# Patient Record
Sex: Female | Born: 1955 | Race: Black or African American | Hispanic: No | Marital: Single | Smoking: Never smoker
Health system: Southern US, Community
[De-identification: ages and names within clinical notes are randomized; demographics above are authoritative.]

## PROBLEM LIST (undated history)

## (undated) DIAGNOSIS — M79674 Pain in right toe(s): Secondary | ICD-10-CM

## (undated) DIAGNOSIS — R4701 Aphasia: Secondary | ICD-10-CM

## (undated) DIAGNOSIS — I10 Essential (primary) hypertension: Secondary | ICD-10-CM

## (undated) DIAGNOSIS — K5790 Diverticulosis of intestine, part unspecified, without perforation or abscess without bleeding: Secondary | ICD-10-CM

## (undated) DIAGNOSIS — B351 Tinea unguium: Secondary | ICD-10-CM

## (undated) DIAGNOSIS — I63542 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: Secondary | ICD-10-CM

## (undated) DIAGNOSIS — I639 Cerebral infarction, unspecified: Secondary | ICD-10-CM

## (undated) DIAGNOSIS — Z8673 Personal history of transient ischemic attack (TIA), and cerebral infarction without residual deficits: Secondary | ICD-10-CM

## (undated) DIAGNOSIS — N814 Uterovaginal prolapse, unspecified: Secondary | ICD-10-CM

## (undated) DIAGNOSIS — N280 Ischemia and infarction of kidney: Secondary | ICD-10-CM

## (undated) HISTORY — DX: 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: I63.542

## (undated) HISTORY — DX: Tinea unguium: M79.674

## (undated) HISTORY — DX: Ischemia and infarction of kidney: N28.0

## (undated) HISTORY — DX: Tinea unguium: B35.1

## (undated) HISTORY — DX: Personal history of transient ischemic attack (TIA), and cerebral infarction without residual deficits: Z86.73

## (undated) HISTORY — DX: Cerebral infarction, unspecified: I63.9

## (undated) HISTORY — DX: Aphasia: R47.01

---

## 2014-08-17 ENCOUNTER — Emergency Department: Payer: Self-pay | Admitting: Emergency Medicine

## 2014-08-17 LAB — PROTIME-INR
INR: 1
Prothrombin Time: 12.9 secs (ref 11.5–14.7)

## 2014-08-17 LAB — URINALYSIS, COMPLETE
BLOOD: NEGATIVE
Bacteria: NONE SEEN
Bilirubin,UR: NEGATIVE
GLUCOSE, UR: NEGATIVE mg/dL (ref 0–75)
Ketone: NEGATIVE
Leukocyte Esterase: NEGATIVE
NITRITE: NEGATIVE
Ph: 6 (ref 4.5–8.0)
Protein: NEGATIVE
RBC,UR: <1 /HPF (ref 0–5)
Specific Gravity: 1.018 (ref 1.003–1.030)
Squamous Epithelial: <1
WBC UR: 2 /HPF (ref 0–5)

## 2014-08-17 LAB — COMPREHENSIVE METABOLIC PANEL
ALBUMIN: 3.3 g/dL — AB (ref 3.4–5.0)
ALT: 183 U/L — AB
ANION GAP: 6 — AB (ref 7–16)
AST: 122 U/L — AB (ref 15–37)
Alkaline Phosphatase: 107 U/L
BUN: 6 mg/dL — ABNORMAL LOW (ref 7–18)
Bilirubin,Total: 0.4 mg/dL (ref 0.2–1.0)
Calcium, Total: 8.1 mg/dL — ABNORMAL LOW (ref 8.5–10.1)
Chloride: 102 mmol/L (ref 98–107)
Co2: 33 mmol/L — ABNORMAL HIGH (ref 21–32)
Creatinine: 0.57 mg/dL — ABNORMAL LOW (ref 0.60–1.30)
Glucose: 92 mg/dL (ref 65–99)
OSMOLALITY: 279 (ref 275–301)
POTASSIUM: 3.2 mmol/L — AB (ref 3.5–5.1)
Sodium: 141 mmol/L (ref 136–145)
TOTAL PROTEIN: 7.7 g/dL (ref 6.4–8.2)

## 2014-08-17 LAB — CBC
HCT: 30.9 % — ABNORMAL LOW (ref 35.0–47.0)
HGB: 10.5 g/dL — ABNORMAL LOW (ref 12.0–16.0)
MCH: 33.9 pg (ref 26.0–34.0)
MCHC: 34.2 g/dL (ref 32.0–36.0)
MCV: 99 fL (ref 80–100)
PLATELETS: 461 10*3/uL — AB (ref 150–440)
RBC: 3.11 10*6/uL — ABNORMAL LOW (ref 3.80–5.20)
RDW: 16.3 % — AB (ref 11.5–14.5)
WBC: 4.1 10*3/uL (ref 3.6–11.0)

## 2014-08-17 LAB — GAMMA GT: GGT: 374 U/L — AB (ref 5–85)

## 2014-08-17 IMAGING — CT CT ABD-PELV W/ CM
2 of 7 series · 16 of 46 positions shown, 18 images · IV contrast (agent unspecified)
Comparison: None.

CLINICAL DATA: Bloody stools

EXAM:
CT ABDOMEN AND PELVIS WITH CONTRAST
TECHNIQUE: Multidetector CT imaging of the abdomen and pelvis was performed
using the standard protocol following bolus administration of
intravenous contrast.
CONTRAST:  100 mL [JI]

[Series 2: routine abd pel with · axial · 0.60mm/px · z∈[-415,-40]mm · 13 of 87 slices shown, 15 images]
[im 6/87  soft-tissue]
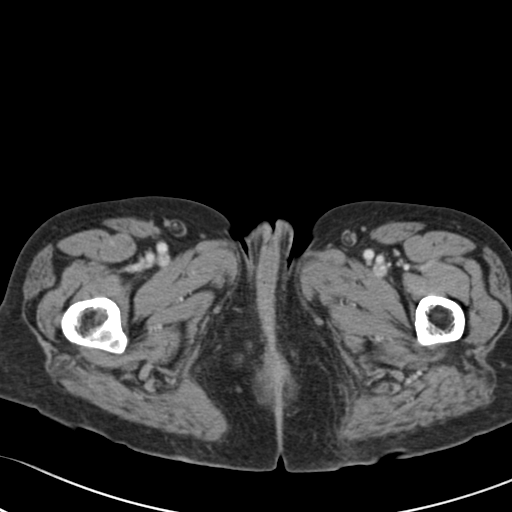
[im 6/87  bone]
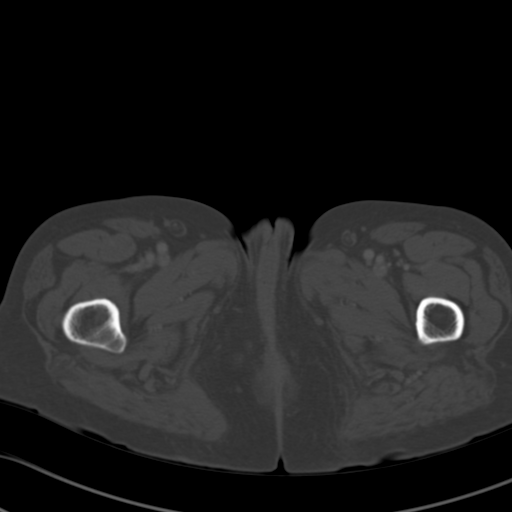
[im 11/87  soft-tissue]
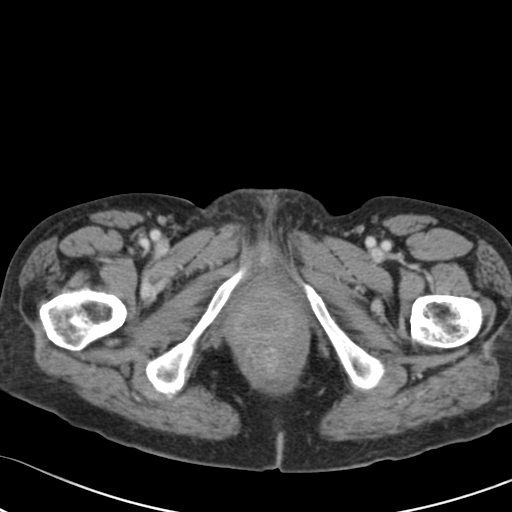
[im 17/87  soft-tissue]
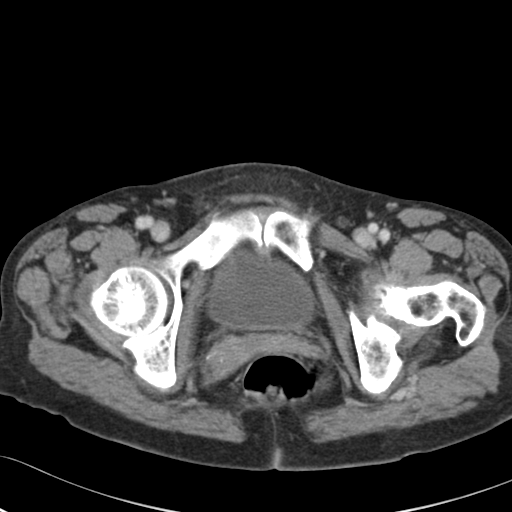
[im 27/87  soft-tissue]
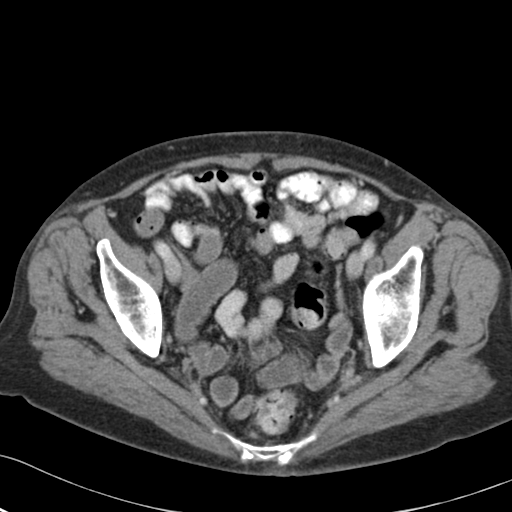
[im 33/87  soft-tissue]
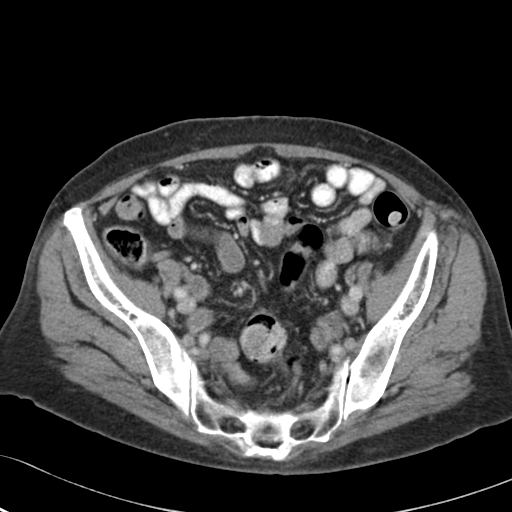
[im 38/87  soft-tissue]
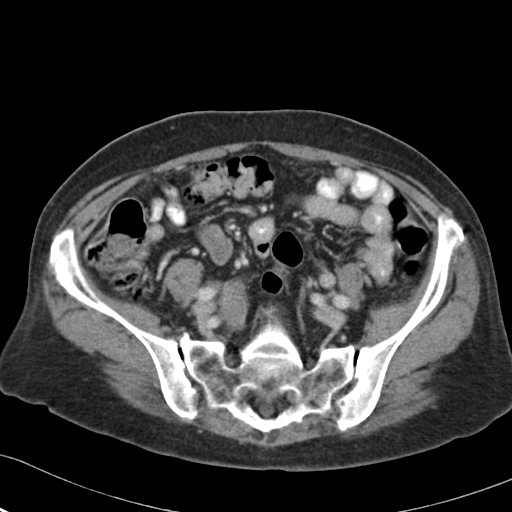
[im 44/87  soft-tissue]
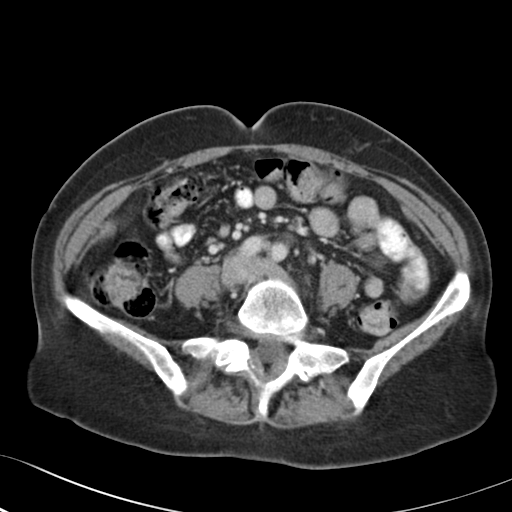
[im 49/87  soft-tissue]
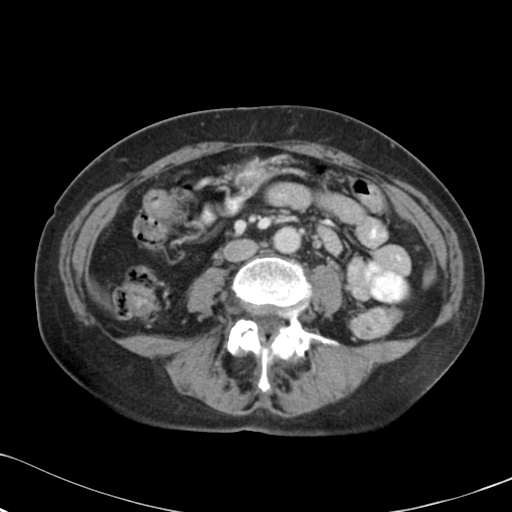
[im 54/87  soft-tissue]
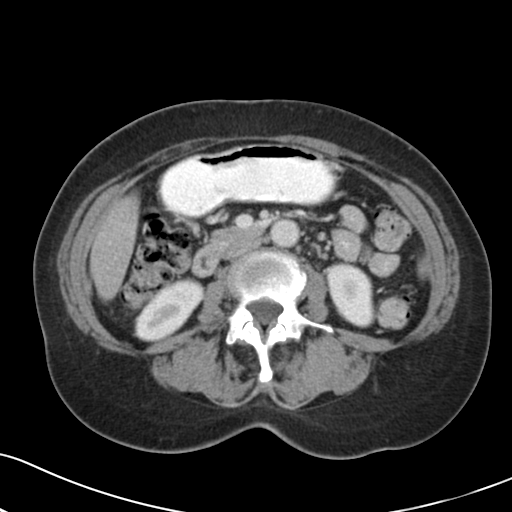
[im 54/87  bone]
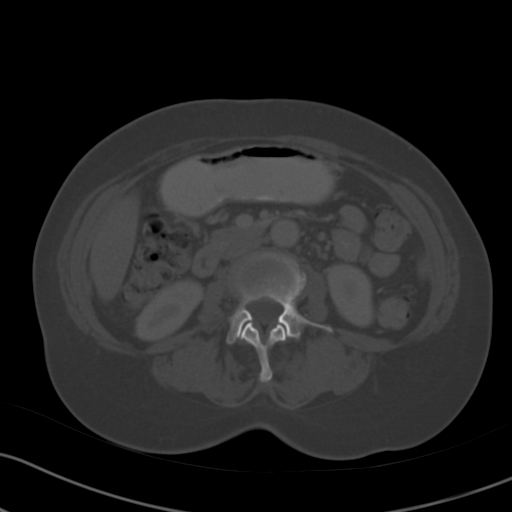
[im 60/87  soft-tissue]
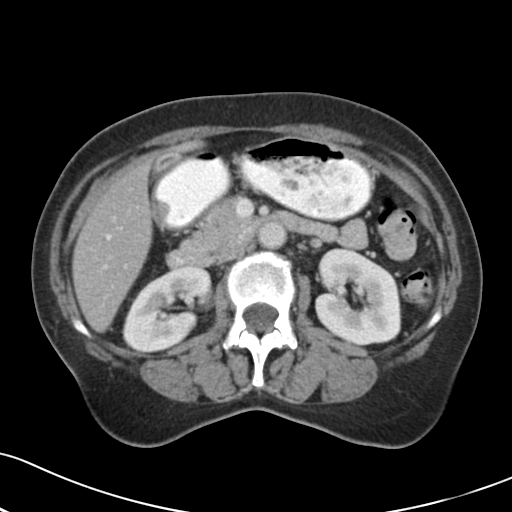
[im 70/87  soft-tissue]
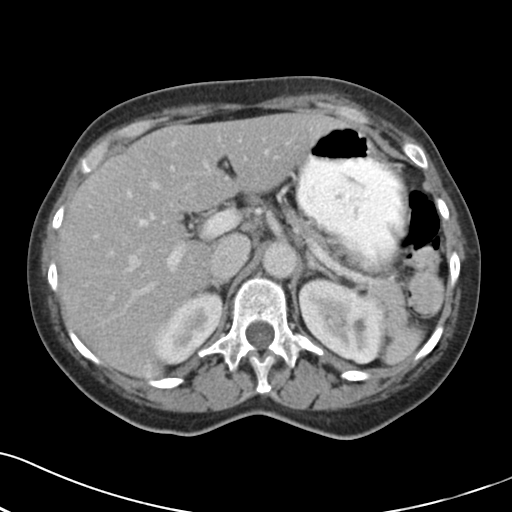
[im 76/87  soft-tissue]
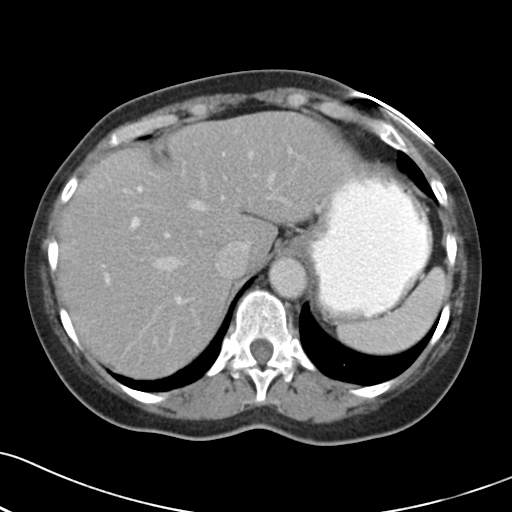
[im 81/87  soft-tissue]
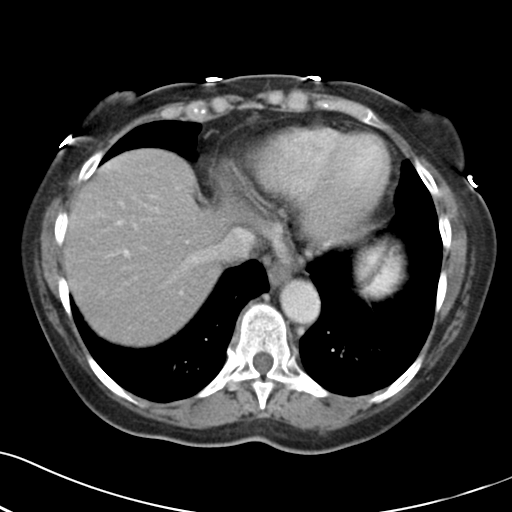

[Series 8: cor routine abd pel with · coronal · 0.60mm/px · 3 of 117 slices shown]
[im 39/117  soft-tissue]
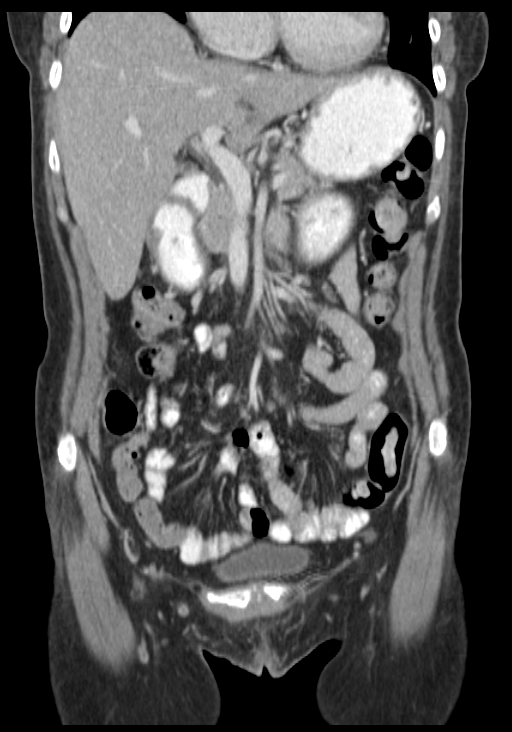
[im 52/117  soft-tissue]
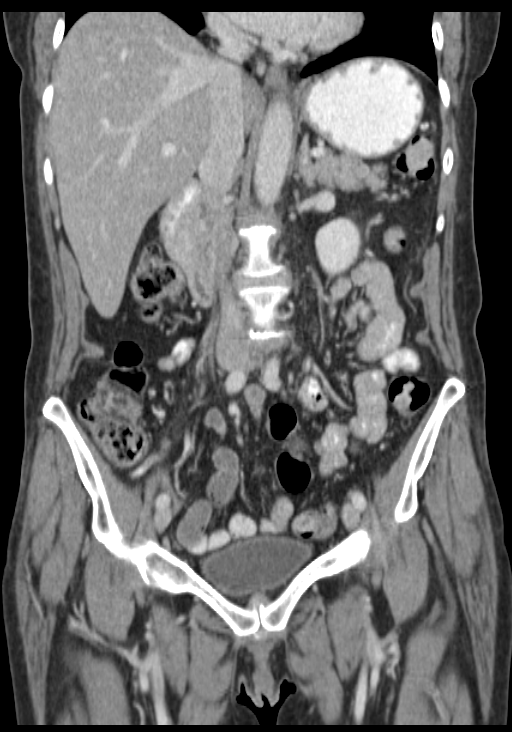
[im 65/117  soft-tissue]
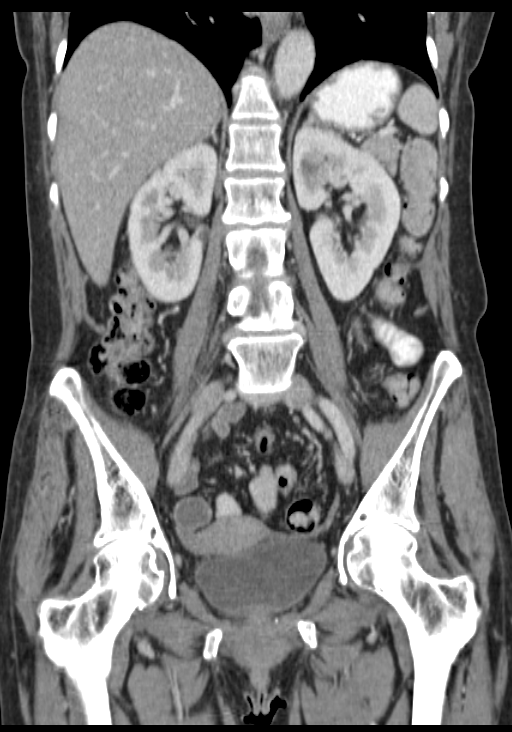

[16 of 46 positions shown; findings below may reference images not displayed]

FINDINGS: The lung bases are free of acute infiltrate or sizable effusion.

The liver is diffusely fatty infiltrated. The gallbladder is
decompressed. The spleen, adrenal glands and pancreas are all normal
in their CT appearance. The kidneys are well visualized bilaterally
and demonstrate evidence of renal cystic change on the left. No
calculi or obstructive changes are noted. The appendix is within
normal limits.

The bladder is well distended. No pelvic mass lesion is seen. No
abnormality to correspond with the patient's given clinical history
is noted. The osseous structures are within normal limits for the
patient's given age. Degenerative change of the lumbar spine is
seen.
IMPRESSION: Chronic changes without acute abnormality.

## 2014-12-17 ENCOUNTER — Emergency Department: Payer: Self-pay | Admitting: Emergency Medicine

## 2015-01-24 NOTE — Consult Note (Signed)
PATIENT NAME:  Lindsey [name], Lindsey [name] MR#:  [id number] DATE OF BIRTH:  06-Feb-1956  DATE OF CONSULTATION:  08/17/2014  REFERRING PHYSICIAN:  Darien Ramus, MD CONSULTING PHYSICIAN:  Kelton Pillar. Sheryle Hail, MD  Clinical question is regarding gastrointestinal bleed.   PRIMARY CARE PHYSICIAN: Not local.   HISTORY OF PRESENT ILLNESS: This is a 59 year old African-American female who presents to the Emergency Department complaining of blood on her toilet paper. This is the second trip the patient has made to the Emergency Department today. The first visit was for a significantly larger amount of blood that she states sank to the bottom of the toilet bowl and barely turned the water in the commode pink. She states that her bleeding was non-painful. She denies lightheadedness, palpitations, shortness of breath or chest pain. The patient also denies abdominal pain. The events of today are the first time this has happened to this degree. She admits that she has had some bleeding hemorrhoids in the past but did not bleed this much. When asked to specifically quantify how much bleeding she had, she states it would be likely less than a bathroom cup of blood. I was initially called to admit the patient to the hospital, but discussed the procedures and ramifications of inpatient hospitalization, which prompted the patient to ask if some of these procedures could be completed as an outpatient.  REVIEW OF SYSTEMS:  CONSTITUTIONAL: The patient denies fever or weakness.  EYES: Denies blurred vision or inflammation.  EARS, NOSE AND THROAT: Denies tinnitus or sore throat.  RESPIRATORY: Denies shortness of breath or cough. CARDIOVASCULAR: Denies chest pain or palpitations.  GASTROINTESTINAL: Denies nausea, vomiting, diarrhea or abdominal pain. She denies hematochezia or hematemesis. The bleeding that she had today was bright red to a slightly maroon color.  GENITOURINARY: Denies dysuria, increased frequency, or hesitancy of  urination.  ENDOCRINE: Denies polyuria or polydipsia. HEMATOLOGIC AND LYMPHATIC: Denies easy bruising or bleeding.  INTEGUMENTARY: Denies rashes or lesions.  MUSCULOSKELETAL: Denies myalgias or arthralgias.  NEUROLOGIC: Denies numbness in her extremities or dysarthria. The patient does admit to headaches.   PSYCHIATRIC: Denies depression or suicidal ideation. The patient admits to anxiety and stress due to recently moving from [place], [place].   PAST MEDICAL HISTORY: Hypertension,   PAST SURGICAL HISTORY: None.  SOCIAL HISTORY: The patient has a history of alcohol abuse. She has stopped drinking 3 weeks ago. She used to drink wine, beer, and liquor in varying amounts, usually on a daily basis. She denies any drug use. She used to smoke while drinking but now occasionally has only 1 to 2 cigarettes per day.   FAMILY HISTORY: Significant for hypertension. There is no heart disease or cancer that she is aware of that runs throughout the family.   MEDICATIONS:  1. Diphenhydramine 25 mg 1 capsule p.o. at bedtime as needed for itching or insomnia.  2. Cetirizine 10 mg 1 capsule p.o. daily.  3. Hydroxyzine 25 mg 1 capsule p.o. 3 times a day as needed for itching.  4. Atenolol 25 mg 1 tab p.o. daily.  5. Amlodipine 10 mg 1 tablet p.o. daily.  6. Clobetasol topical ointment 0.05% applied topically to the affected area at bedtime.  7. Rea Lo 40% topical cream, apply a thick amount of cream to itchy spots every morning. Notably, the patient does not take all of her antihistamines within the same day.   ALLERGIES: No known drug allergies.   PERTINENT LABORATORY RESULTS AND RADIOGRAPHIC FINDINGS: Serum glucose is 92. BUN is 6.  Creatinine 0.57. Sodium is 141. Potassium is 3.2. Chloride is 102. Bicarbonate is 33. Calcium is 8.1. Serum albumin is 3.3. Alkaline phosphatase is 107. AST is 122. ALT is 183. GGT is 374. White blood cell count is 4.1. Hemoglobin is 10.5. Hematocrit 30.9. Platelet count is  461,000, INR is 1. Urinalysis is negative for infection.   PHYSICAL EXAMINATION:  VITAL SIGNS: Temperature is 99.8, pulse is 85, respirations 18, blood pressure 133/75, pulse oximetry 100% on room air.  GENERAL: The patient is alert and oriented x3 in no apparent distress.   HEENT: Normocephalic, atraumatic. Pupils equal, round, and reactive to light and accommodation. Extraocular movements are intact. Mucous membranes are moist.  NECK: Trachea is midline. No adenopathy.  CHEST: Symmetric, atraumatic.  CARDIOVASCULAR: Regular rate and rhythm. Normal S1, S2. No rubs, clicks, or murmurs appreciated.  LUNGS: Clear to auscultation bilaterally. Normal effort and excursion.  ABDOMEN: Positive bowel sounds. Soft, nontender, nondistended. No hepatosplenomegaly.  GENITOURINARY: Deferred.  MUSCULOSKELETAL: The patient moves all 4 extremities equally. There is 5/5 strength in upper and lower extremities bilaterally.  SKIN: No rashes, but the patient does have some nodular lesions that appeared to be worsened by constant picking and scratching.  EXTREMITIES: No clubbing, cyanosis, or edema.  NEUROLOGIC: Cranial nerves II through XII are grossly intact.  PSYCHIATRIC: Mood is normal. Affect is congruent.   ASSESSMENT AND PLAN: This is a 59 year old female with some bleeding per rectum.  1. Gastrointestinal bleed. The patient describes the blood as bright red and non-painful. It was a small volume of blood and the patient was asymptomatic from a constitutional standpoint. It is unlikely that she has a significant lower gastrointestinal bleed that would lead to hemodynamic ability. She likely has some internal hemorrhoids at this point that may be bleeding. I have discussed this with the patient and she would prefer not to spend the night in the hospital. She expresses if she is able to obtain a screening endoscopy as an outpatient, she would prefer that as it would be more convenient to her schedule as well as  more financially feasible. I do not believe that the patient's gastrointestinal bleed is unstable, and she will be safe for outpatient evaluation.  2. Hypokalemia. The patient received some oral potassium repletion in the Emergency Department.  3. Transaminitis. The patient's hepatic enzyme ratio is not specifically consistent with alcoholic hepatitis. However, she admits to drinking in the past. The elevation in GGT suggests that she has some intrahepatic biliary inflammation, which could come from viral hepatitis or some cholestasis. Notably, the patient does not have any scleral icterus nor any jaundice to her soft palate or nail beds. Her total bilirubin is within normal limits. Outpatient evaluation by the gastroenterologist should follow up these findings.  4. Anemia. The patient does not have a dangerously low hemoglobin or hematocrit. This is likely secondary to a very slow gastrointestinal bleed or simply chronic iron deficiency, although her MCV is high-normal (likely secondary to chronic B12 deficiency due to alcohol abuse). We have encouraged the patient to establish care with a primary care doctor who can address this issue, as well as her gastroenterologist.  5. Disposition: The patient has been given the number for the Emergency Department social worker who can help with her case management. We have also told her that we will try to arrange an outpatient appointment with gastroenterology if establishing care with a primary care doctor is too difficult for right now. We have obtained the patient's contact information  and will be sure to follow up to be sure she has her gastrointestinal bleed evaluated in a timely fashion  .  ____________________________ Kelton Pillar. Sheryle Hail, MD msd:jh D: 08/18/2014 04:58:53 ET T: 08/18/2014 16:10:96 ET JOB#: [id number]  cc: Kelton Pillar. Sheryle Hail, MD, <Dictator> Kelton Pillar DIAMOND MD ELECTRONICALLY SIGNED 08/21/2014 1:25

## 2015-10-28 ENCOUNTER — Ambulatory Visit
Admission: RE | Admit: 2015-10-28 | Discharge: 2015-10-28 | Disposition: A | Discharge status: Home / Self Care | Payer: Self-pay | Source: Ambulatory Visit | Attending: Oncology | Admitting: Oncology

## 2015-10-28 ENCOUNTER — Other Ambulatory Visit: Payer: Self-pay | Admitting: Oncology

## 2015-10-28 ENCOUNTER — Ambulatory Visit: Payer: Self-pay | Attending: Oncology | Admitting: *Deleted

## 2015-10-28 ENCOUNTER — Encounter: Payer: Self-pay | Admitting: *Deleted

## 2015-10-28 VITALS — BP 117/82 | HR 81 | Temp 98.7°F | Resp 16 | Ht 64.96 in | Wt 175.6 lb

## 2015-10-28 DIAGNOSIS — Z0000 Encounter for general adult medical examination without abnormal findings: Secondary | ICD-10-CM

## 2015-10-28 IMAGING — MG MM DIGITAL SCREENING BILAT W/ CAD
6 series · 6 of 6 positions shown · non-contrast
Comparison: Previous exam(s).

CLINICAL DATA: Screening.

EXAM:
DIGITAL SCREENING BILATERAL MAMMOGRAM WITH CAD

[L MLO]
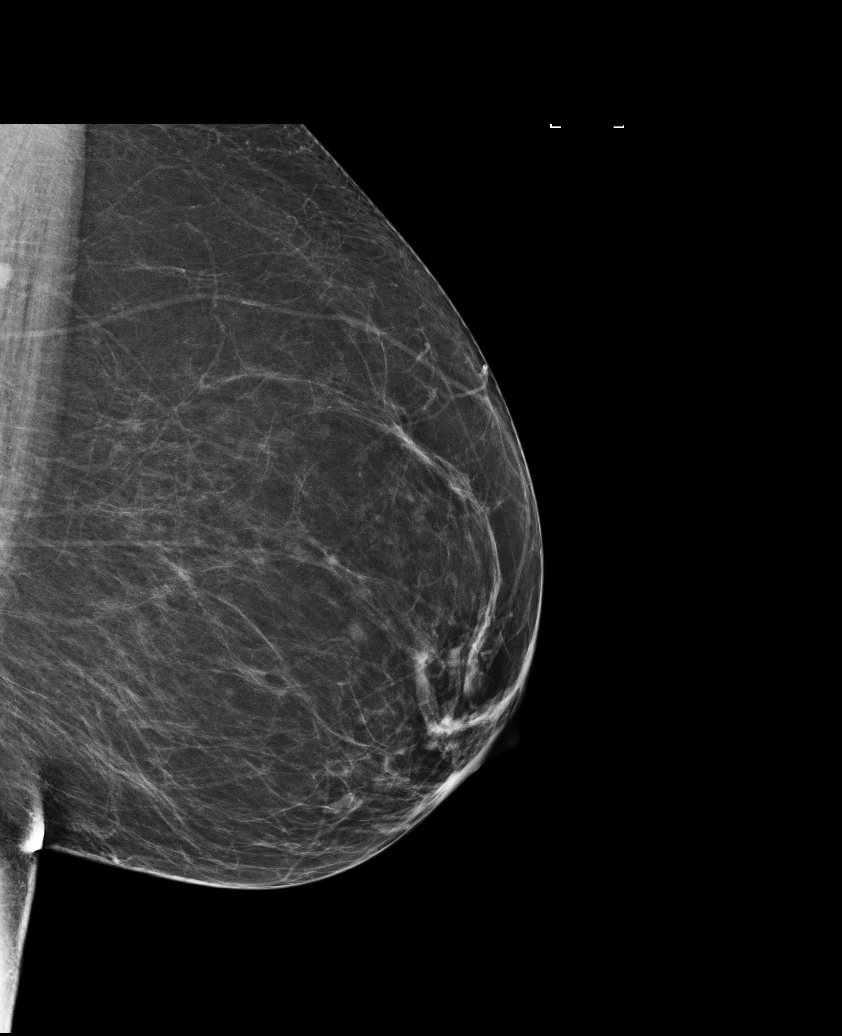

[R CC (1 of 2)]
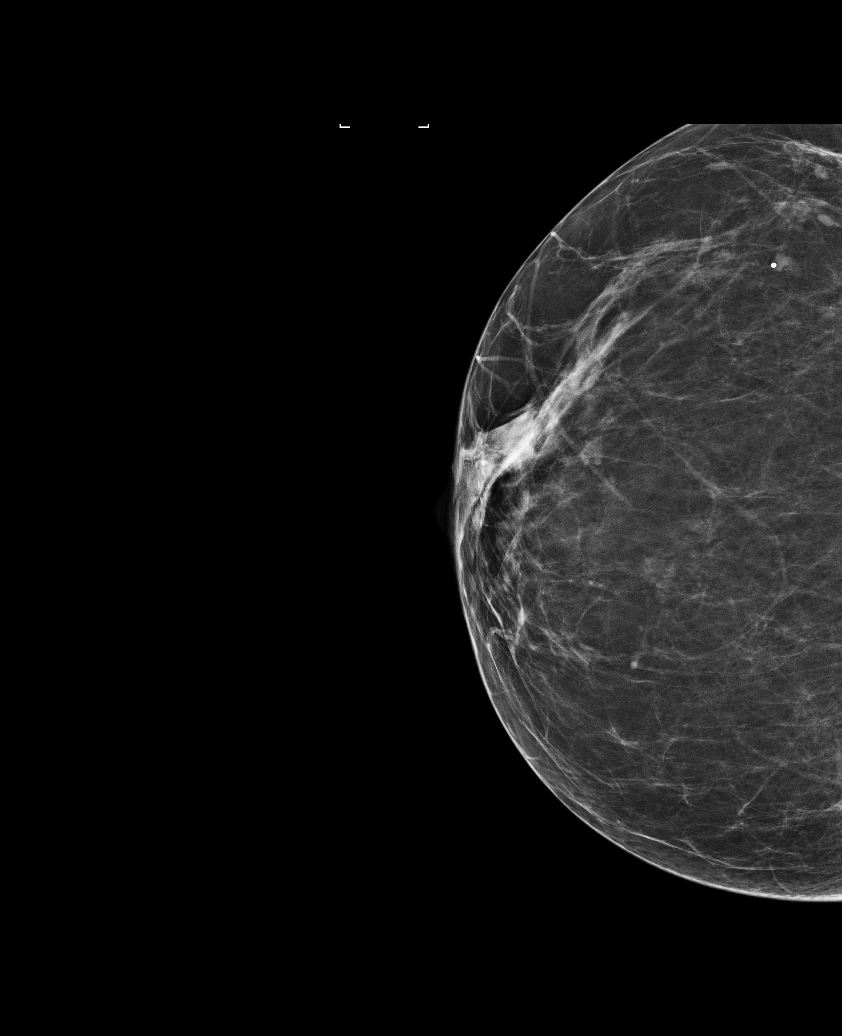

[R MLO (1 of 2)]
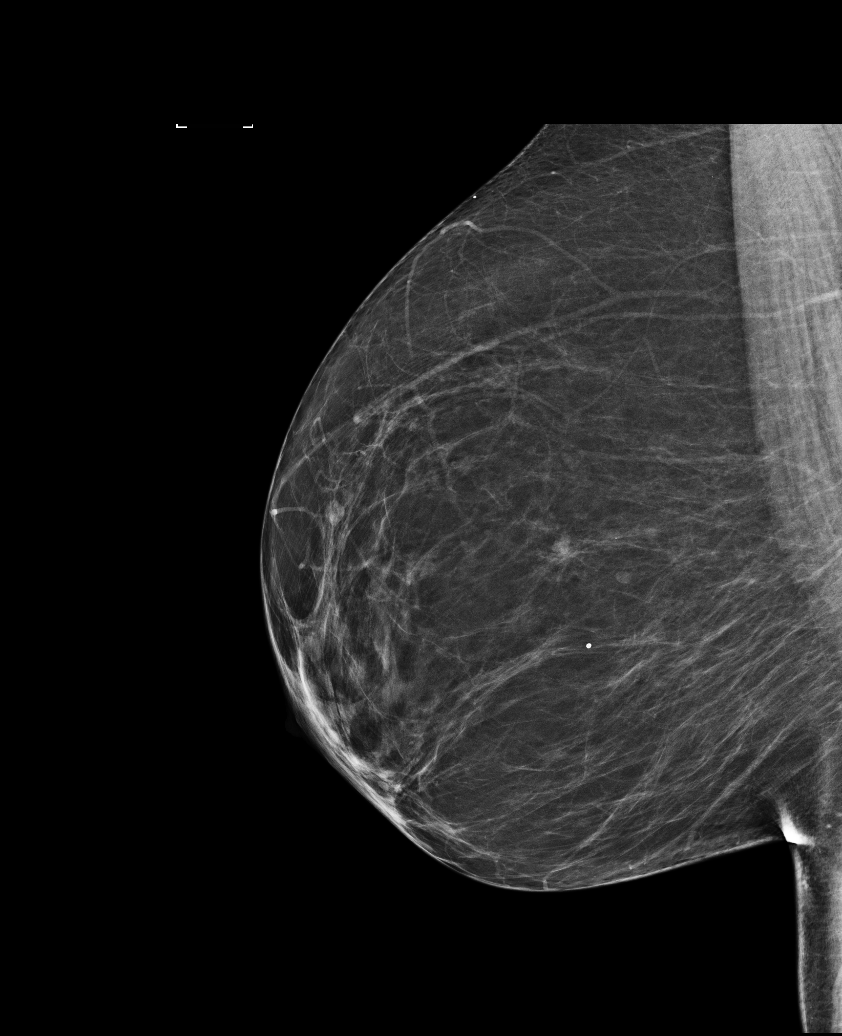

[L CC]
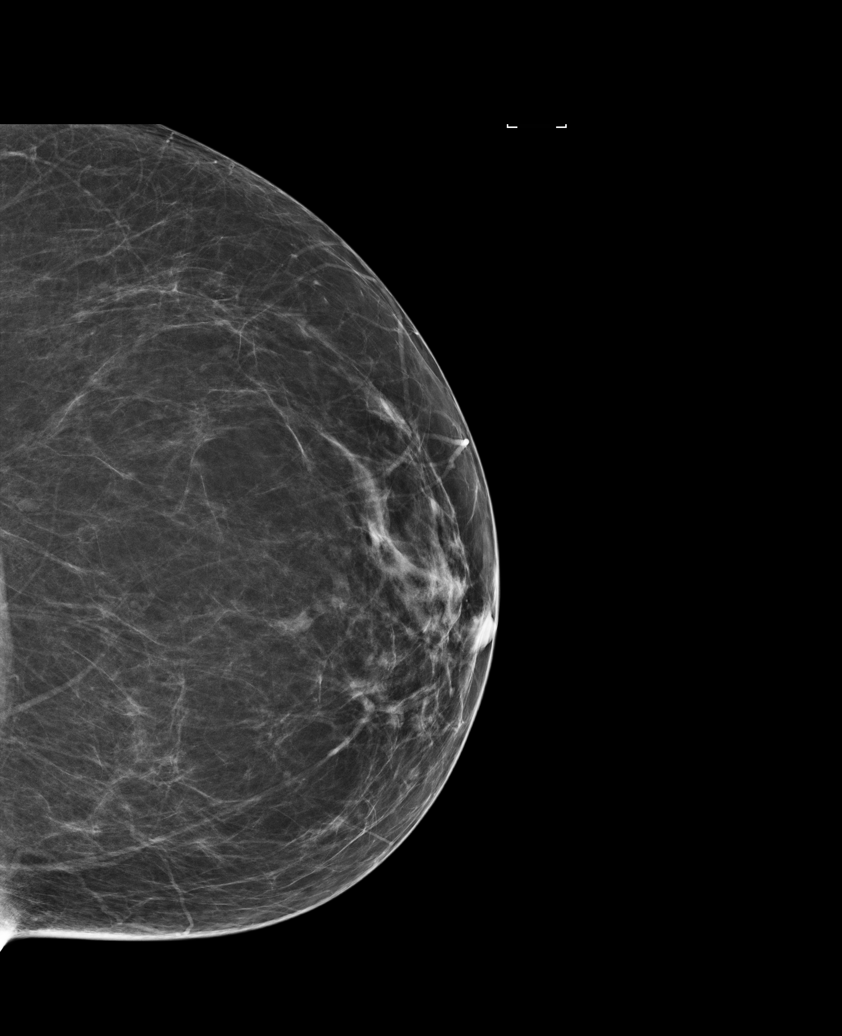

[R CC (2 of 2)]
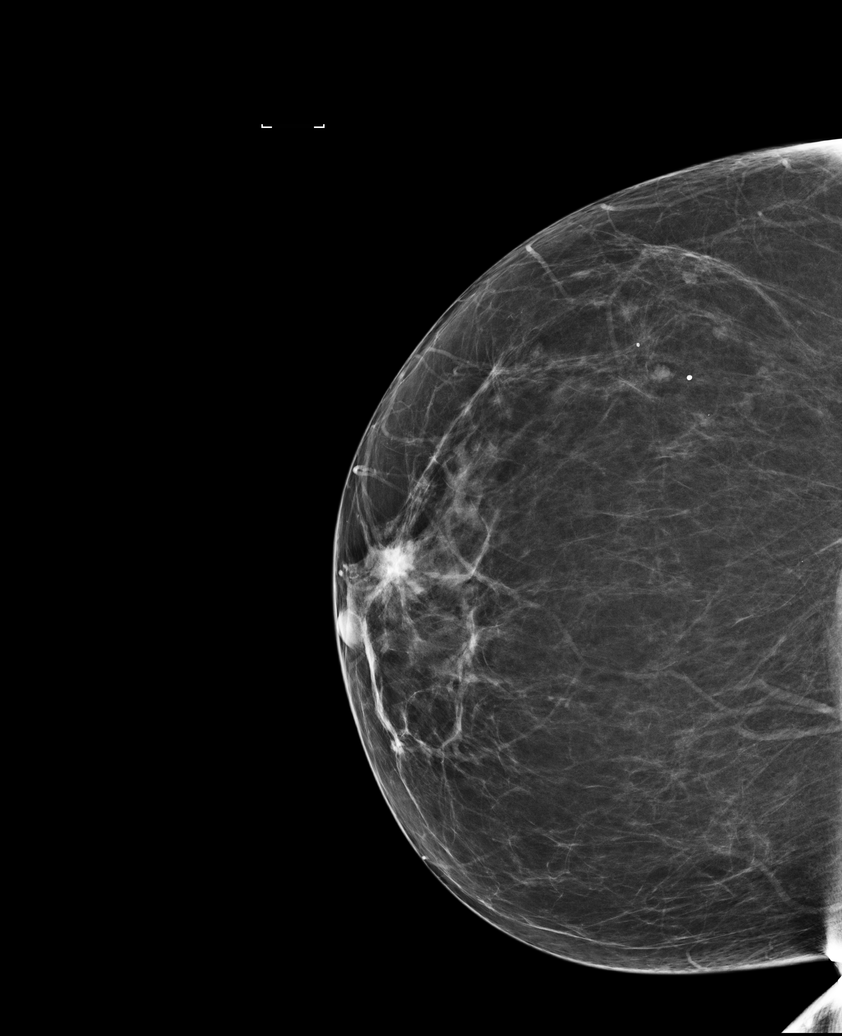

[R MLO (2 of 2)]
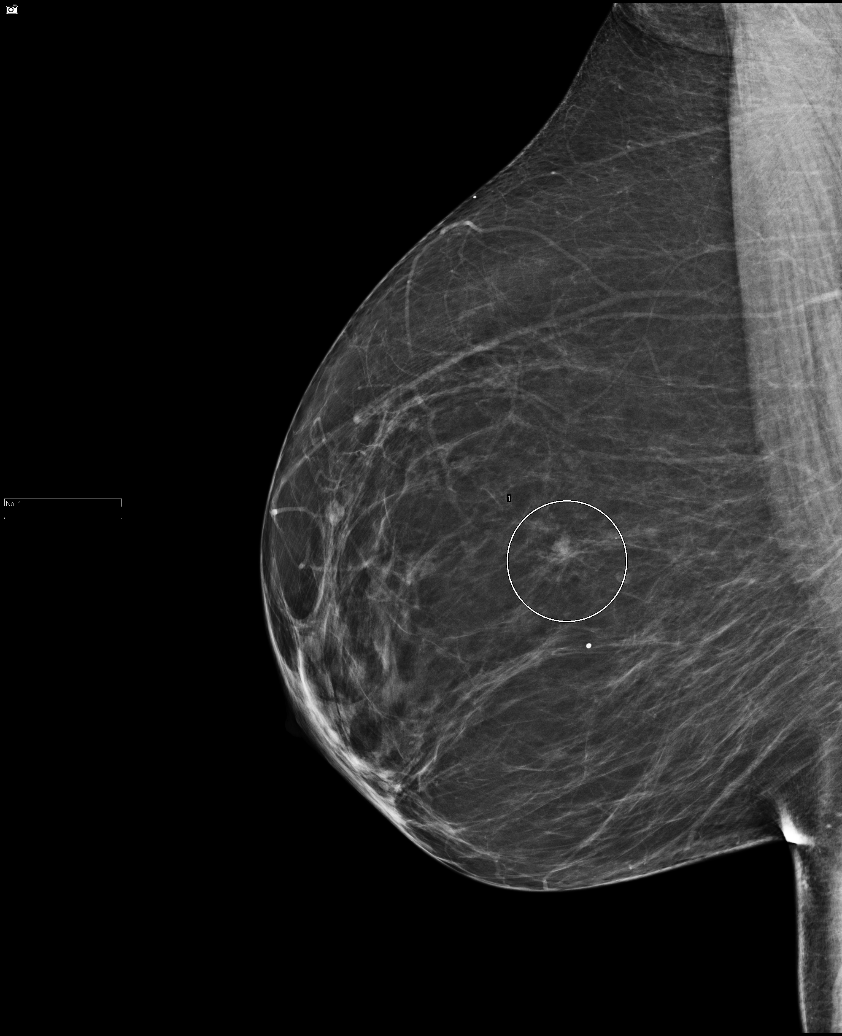

[6 of 6 positions shown; findings below may reference images not displayed]

ACR Breast Density Category b: There are scattered areas of
fibroglandular density.
FINDINGS: In the right breast, 2 asymmetries warrant further evaluation. In
the left breast, no findings suspicious for malignancy. Images were
processed with CAD.
IMPRESSION: Further evaluation is suggested for asymmetries in the right breast.

RECOMMENDATION:
Diagnostic mammogram and possibly ultrasound of the right breast.
(Code:[UC])

The patient will be contacted regarding the findings, and additional
imaging will be scheduled.

BI-RADS CATEGORY  0: Incomplete. Need additional imaging evaluation
and/or prior mammograms for comparison.

## 2015-10-28 NOTE — Patient Instructions (Signed)
Human Papillomavirus Human papillomavirus (HPV) is the most common sexually transmitted infection (STI) and is highly contagious. HPV infections cause genital warts and cancers to the outlet of the womb (cervix), birth canal (vagina), opening of the birth canal (vulva), and anus. There are over 100 types of HPV. Unless wartlike lesions are present in the throat or there are genital warts that you can see or feel, HPV usually does not cause symptoms. It is possible to be infected for long periods and pass it on to others without knowing it. CAUSES  HPV is spread from person to person through sexual contact. This includes oral, vaginal, or anal sex. RISK FACTORS  Having unprotected sex. HPV can be spread by oral, vaginal, or anal sex.  Having several sex partners.  Having a sex partner who has other sex partners.  Having or having had another sexually transmitted infection. SIGNS AND SYMPTOMS  Most people carrying HPV do not have any symptoms. If symptoms are present, symptoms may include:  Wartlike lesions in the throat (from having oral sex).  Warts in the infected skin or mucous membranes.  Genital warts that may itch, burn, or bleed.  Genital warts that may be painful or bleed during sexual intercourse. DIAGNOSIS  If wartlike lesions are present in the throat or genital warts are present, your health care provider can usually diagnose HPV by physical examination.   Genital warts are easily seen with the naked eye.  Currently, there is no FDA-approved test to detect HPV in males.  In females, a Pap test can show cells that are infected with HPV.  In females, a scope can be used to view the cervix (colposcopy). A colposcopy can be performed if the pelvic exam or Pap test is abnormal. A sample of tissue may be removed (biopsy) during the colposcopy. TREATMENT  There is no treatment for the virus itself. However, there are treatments for the health problems and symptoms HPV can cause.  Your health care provider will follow you closely after you are treated. This is because the HPV can come back and may need treatment again. Treatment of HPV may include:   Medicines, which may be injected or applied in a cream, lotion, or gel form.  Use of a probe to apply extreme cold (cryotherapy).  Application of an intense beam of light (laser treatment).  Use of a probe to apply extreme heat (electrocautery).  Surgery. HOME CARE INSTRUCTIONS   Take medicines only as directed by your health care provider.  Use over-the-counter creams for itching or irritation as directed by your health care provider.  Keep all follow-up visits as directed by your health care provider. This is important.  Do not touch or scratch the warts.  Do not treat genital warts with medicines used for treating hand warts.  Do not have sex while you are being treated.  Do not douche or use tampons during treatment of HPV.  Tell your sex partner about your infection because he or she may also need treatment.  If you become pregnant, tell your health care provider that you have had HPV. Your health care provider will monitor you closely during pregnancy to be sure your baby is safe.  After treatment, use condoms during sex to prevent future infections.  Have only one sex partner.  Have a sex partner who does not have other sex partners. PREVENTION   Talk to your health care provider about getting the HPV vaccines. These vaccines prevent some HPV infections and cancers.   It is recommended that the vaccine be given to males and females between the ages of 9 and 26 years old. It will not work if you already have HPV, and it is not recommended for pregnant women.  A Pap test is done to screen for cervical cancer in women.  The first Pap test should be done at age 21 years.  Between ages 21 and 29 years, Pap tests are repeated every 2 years.  Beginning at age 30, you are advised to have a Pap test every  3 years as long as your past 3 Pap tests have been normal.  Some women have medical problems that increase the chance of getting cervical cancer. Talk to your health care provider about these problems. It is especially important to talk to your health care provider if a new problem develops soon after your last Pap test. In these cases, your health care provider may recommend more frequent screening and Pap tests.  The above recommendations are the same for women who have or have not gotten the vaccine for HPV.  If you had a hysterectomy for a problem that was not a cancer or a condition that could lead to cancer, then you no longer need Pap tests. However, even if you no longer need a Pap test, a regular exam is a good idea to make sure no other problems are starting.   If you are between the ages of 65 and 70 years and you have had normal Pap tests going back 10 years, you no longer need Pap tests. However, even if you no longer need a Pap test, a regular exam is a good idea to make sure no other problems are starting.  If you have had past treatment for cervical cancer or a condition that could lead to cancer, you need Pap tests and screening for cancer for at least 20 years after your treatment.  If Pap tests have been discontinued, risk factors (such as a new sexual partner)need to be reassessed to determine if screening should be resumed.  Some women may need screenings more often if they are at high risk for cervical cancer. SEEK MEDICAL CARE IF:   The treated skin becomes red, swollen, or painful.  You have a fever.  You feel generally ill.  You feel lumps or pimple-like projections in and around your genital area.  You develop bleeding of the vagina or the treatment area.  You have painful sexual intercourse. MAKE SURE YOU:   Understand these instructions.  Will watch your condition.  Will get help if you are not doing well or get worse.   This information is not  intended to replace advice given to you by your health care provider. Make sure you discuss any questions you have with your health care provider.   Document Released: 12/10/2003 Document Revised: 10/10/2014 Document Reviewed: 12/25/2013 Elsevier Interactive Patient Education 2016 [employer]   Gave patient hand-out, Women Staying Healthy, Active and Well from [facility], with education on breast health, pap smears, heart and colon health. 

## 2015-10-28 NOTE — Progress Notes (Signed)
Subjective:     Patient ID: Lindsey [name], female   DOB: [birth year], 60 y.o.   MRN: [id number]  HPI   Review of Systems     Objective:   Physical Exam  Pulmonary/Chest: Right breast exhibits no inverted nipple, no mass, no nipple discharge, no skin change and no tenderness. Left breast exhibits no inverted nipple, no mass, no nipple discharge, no skin change and no tenderness. Breasts are symmetrical.  Abdominal: There is no splenomegaly or hepatomegaly.  Genitourinary: Rectal exam shows external hemorrhoid. Rectal exam shows no mass. No breast swelling, tenderness, discharge or bleeding. No labial fusion. There is no rash, tenderness, lesion or injury on the right labia. There is no rash, tenderness, lesion or injury on the left labia. Uterus is not deviated, not enlarged, not fixed and not tender. Cervix exhibits no motion tenderness, no discharge and no friability. Right adnexum displays no mass, no tenderness and no fullness. Left adnexum displays no mass, no tenderness and no fullness. No erythema, tenderness or bleeding in the vagina. No foreign body around the vagina. No signs of injury around the vagina. No vaginal discharge found.  Gaping introitus - with large cystocele noted       Assessment:     60 year old Black female presents to [facility] for clinical breast exam, pap and mammogram.  Clinical breast exam unremarkable.  Taught self breast awareness.  Specimen collected for pap smear.  Large cystocele noted.  States she went to the ED and was told she had a cystocele.  Patient has been screened for eligibility.  She does not have any insurance, Medicare or Medicaid.  She also meets financial eligibility.  Hand-out given on the Affordable Care Act.     Plan:     Screening mammogram ordered.  Specimen sent to the lab.  Will follow-up per [facility] protocol.

## 2015-11-04 LAB — PAP LB AND HPV HIGH-RISK
HPV, high-risk: NEGATIVE
PAP Smear Comment: 0

## 2015-11-05 ENCOUNTER — Other Ambulatory Visit: Payer: Self-pay | Admitting: *Deleted

## 2015-11-05 DIAGNOSIS — N63 Unspecified lump in unspecified breast: Secondary | ICD-10-CM

## 2015-11-18 ENCOUNTER — Ambulatory Visit
Admission: RE | Admit: 2015-11-18 | Discharge: 2015-11-18 | Disposition: A | Discharge status: Home / Self Care | Payer: Self-pay | Source: Ambulatory Visit | Attending: Oncology | Admitting: Oncology

## 2015-11-18 DIAGNOSIS — N63 Unspecified lump in unspecified breast: Secondary | ICD-10-CM

## 2015-11-18 IMAGING — MG MM DIAG BREAST TOMO UNI RIGHT
7 series · 8 of 15 positions shown · non-contrast
Comparison: Previous exam(s).

CLINICAL DATA: 59-year-old female for evaluation of possible right
breast mass on recent screening mammogram.

EXAM:
DIGITAL DIAGNOSTIC RIGHT MAMMOGRAM WITH 3D TOMOSYNTHESIS AND CAD

[R CC (1 of 2)]
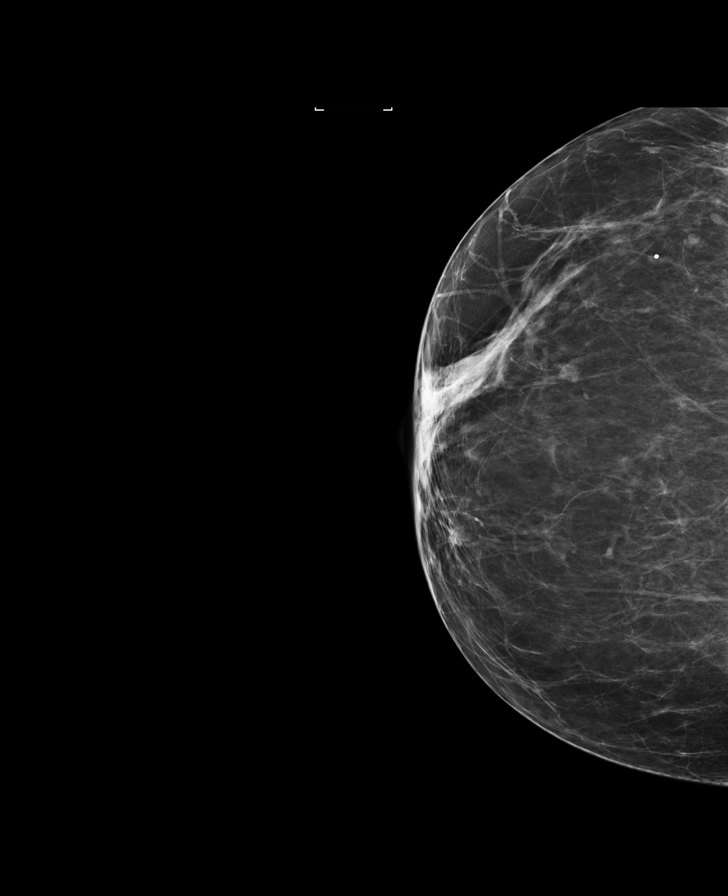

[R CC (2 of 2)]
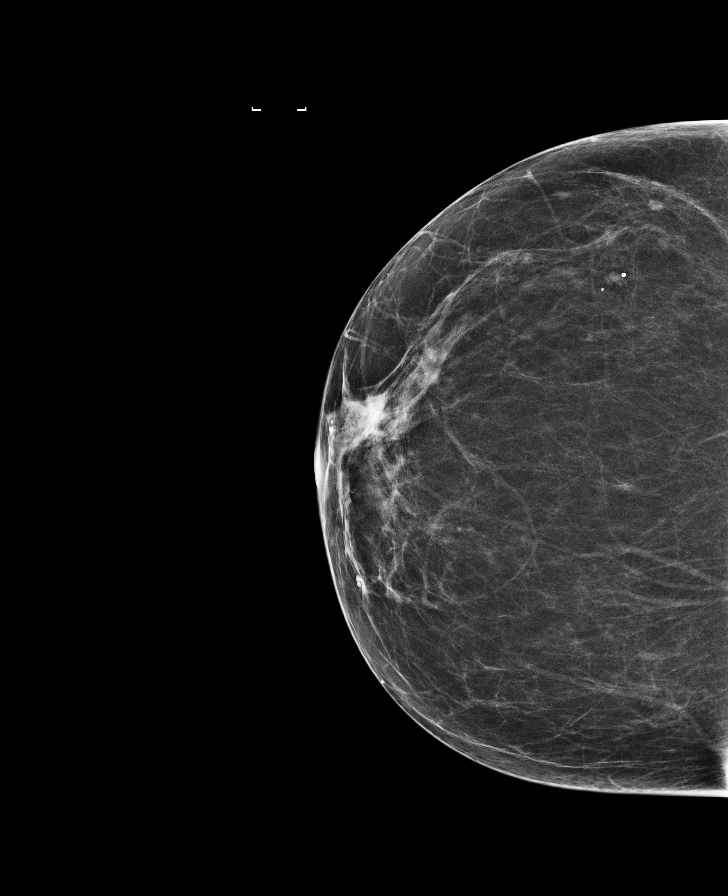

[R CC synth-2D]
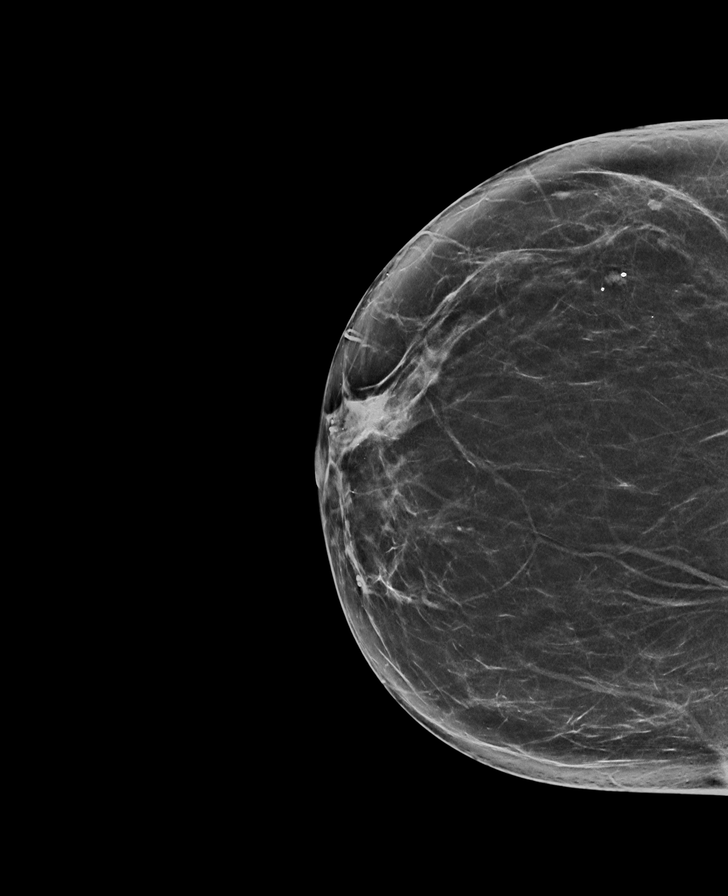

[R MLO]
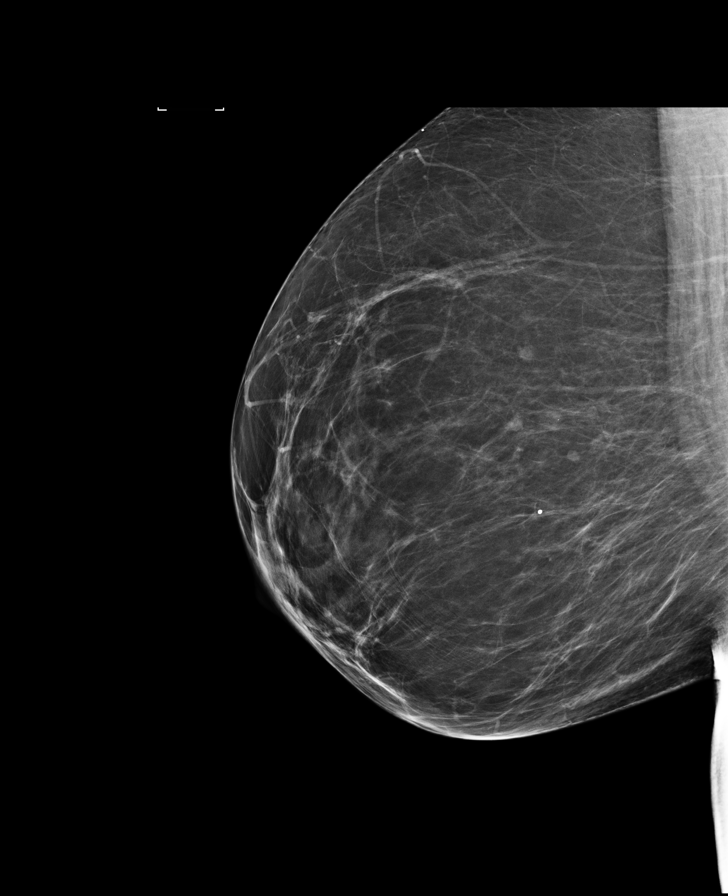

[R MLO synth-2D]
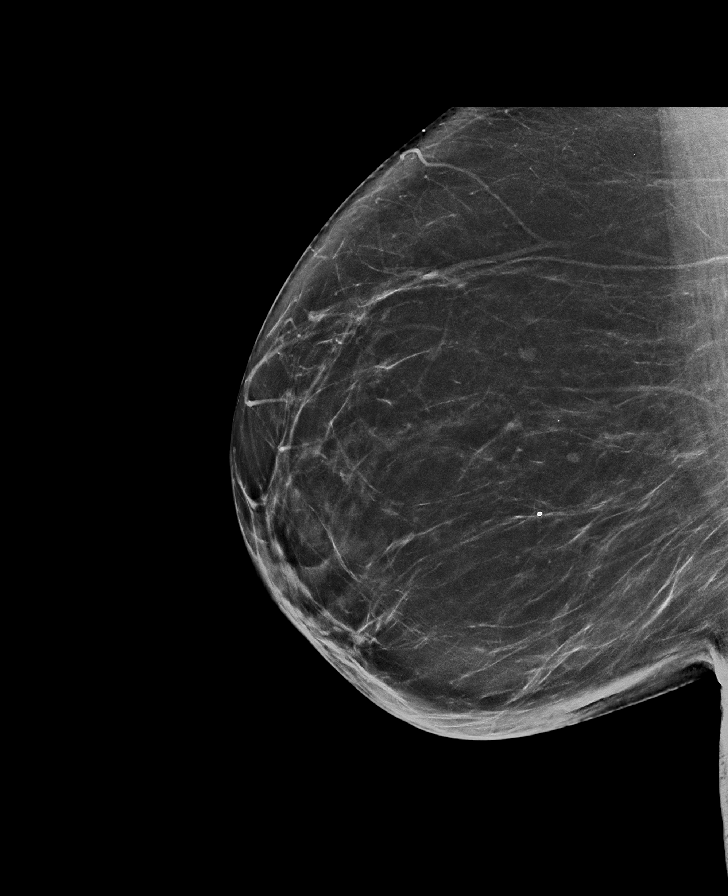

[R MLO tomo · 2 of 78 frames shown]
[frame 26/78]
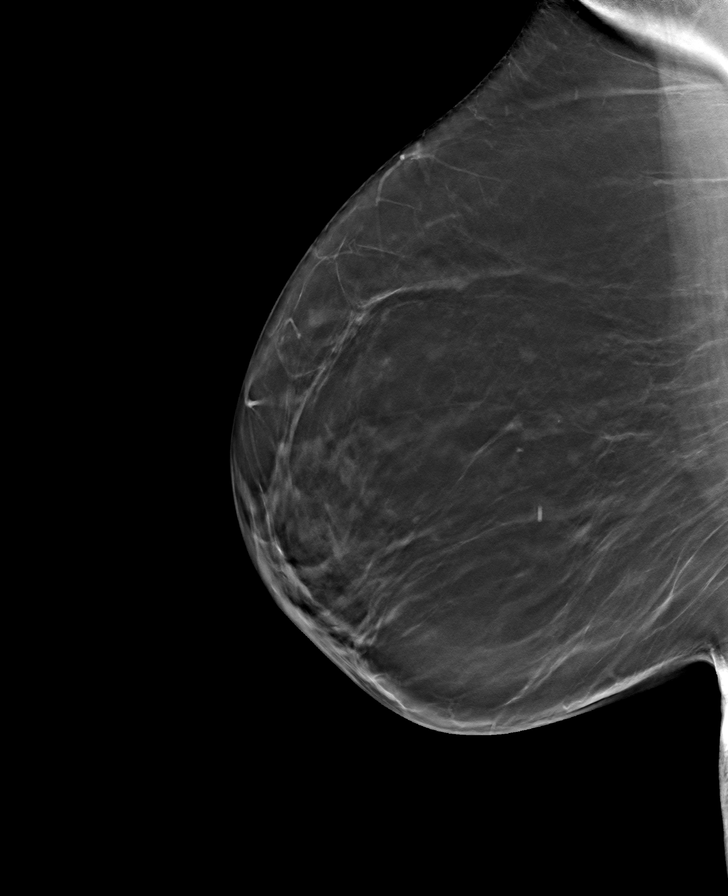
[frame 39/78]
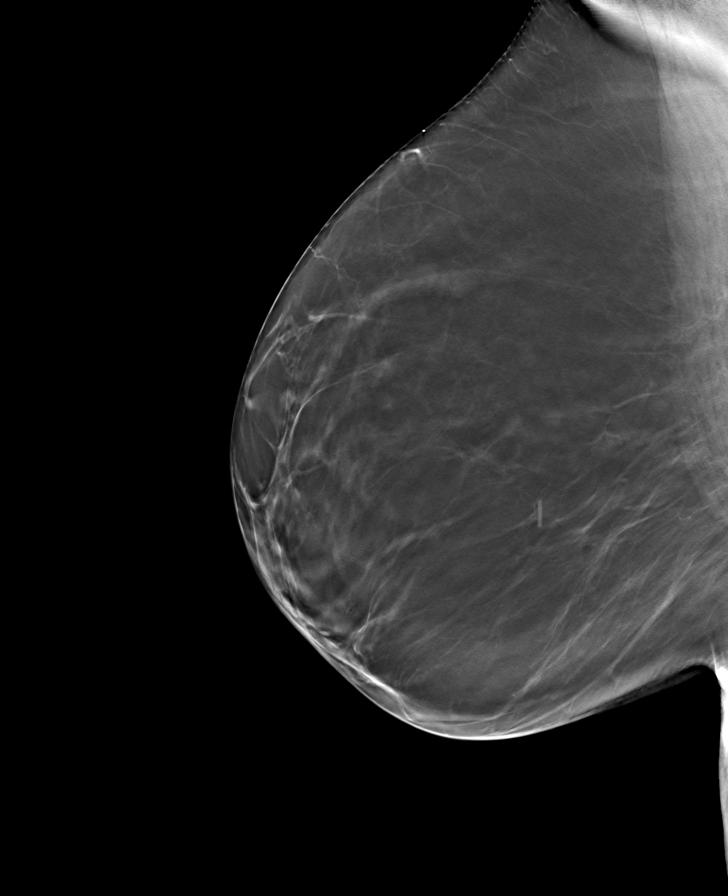

[R CC tomo · tomo slice 35/70.0]
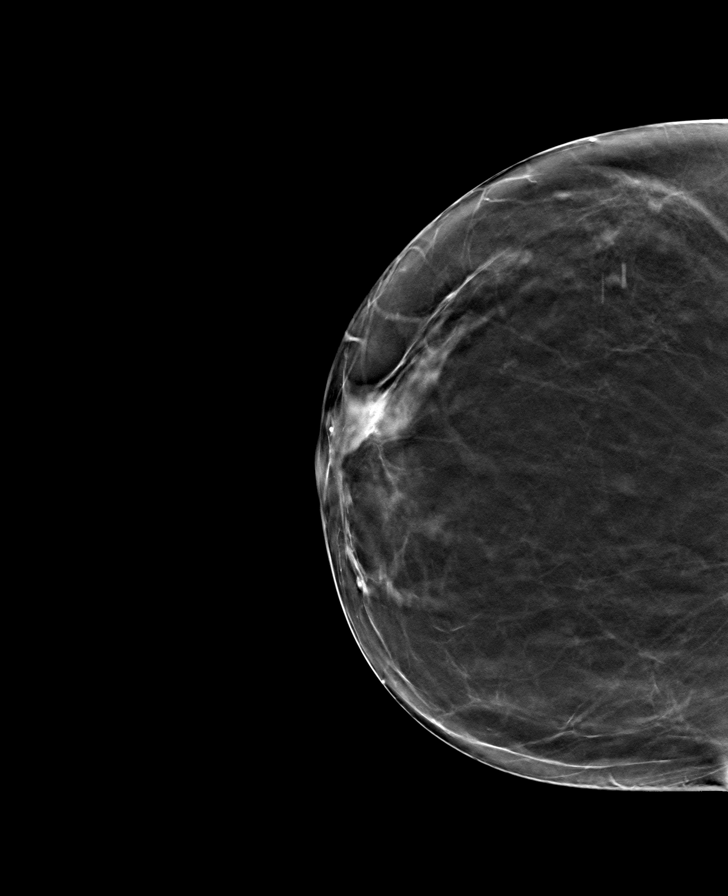

[8 of 15 positions shown; findings below may reference images not displayed]

ACR Breast Density Category b: There are scattered areas of
fibroglandular density.
FINDINGS: 2D and 3D full field views of the right breast demonstrate no
suspicious mass, distortion or worrisome calcifications. The
retroareolar right breast has a similar appearance to remote prior
studies.

Mammographic images were processed with CAD.
IMPRESSION: No persistent abnormality in the area of the screening study
finding.

RECOMMENDATION:
Bilateral screening mammograms in 1 year.

I have discussed the findings and recommendations with the patient.
Results were also provided in writing at the conclusion of the
visit. If applicable, a reminder letter will be sent to the patient
regarding the next appointment.

BI-RADS CATEGORY  1: Negative.

## 2015-11-19 ENCOUNTER — Encounter: Payer: Self-pay | Admitting: *Deleted

## 2015-11-19 NOTE — Progress Notes (Signed)
Letter mailed to inform patient of her normal mammogram and pap smear.  She is to return in one year with annual mammogram and next pap will be due in 5 years.  [facility] to [place].

## 2016-11-23 ENCOUNTER — Encounter: Payer: Self-pay | Admitting: Emergency Medicine

## 2016-11-23 ENCOUNTER — Emergency Department
Admission: EM | Admit: 2016-11-23 | Discharge: 2016-11-23 | Disposition: A | Discharge status: Home / Self Care | Payer: Self-pay | Attending: Emergency Medicine | Admitting: Emergency Medicine

## 2016-11-23 DIAGNOSIS — L700 Acne vulgaris: Secondary | ICD-10-CM | POA: Insufficient documentation

## 2016-11-23 DIAGNOSIS — Z79899 Other long term (current) drug therapy: Secondary | ICD-10-CM | POA: Insufficient documentation

## 2016-11-23 DIAGNOSIS — Z87891 Personal history of nicotine dependence: Secondary | ICD-10-CM | POA: Insufficient documentation

## 2016-11-23 DIAGNOSIS — M436 Torticollis: Secondary | ICD-10-CM | POA: Insufficient documentation

## 2016-11-23 MED ORDER — DIAZEPAM 2 MG PO TABS: 2.0000 mg | ORAL_TABLET | Freq: Three times a day (TID) | ORAL | 0 refills | Status: DC | PRN

## 2016-11-23 MED ORDER — NAPROXEN 500 MG PO TABS: 500.0000 mg | ORAL_TABLET | Freq: Two times a day (BID) | ORAL | 0 refills | Status: DC

## 2016-11-23 MED ORDER — SULFAMETHOXAZOLE-TRIMETHOPRIM 800-160 MG PO TABS: 1.0000 | ORAL_TABLET | Freq: Two times a day (BID) | ORAL | 0 refills | Status: DC

## 2016-11-23 NOTE — ED Provider Notes (Signed)
[facility] Emergency Department Provider Note ____________________________________________   None    (approximate)  I have reviewed the triage vital signs and the nursing notes.   HISTORY  Chief Complaint Back Pain   HPI Lindsey [name] is a 61 y.o. female who presents to the emergency department for multiple medical complaints. She has had neck stiffness for the pastweek. No known injury. She also has a pimple on the left side of her face that is becoming extremely tender and gradually getting larger. She has been taking Tylenol without relief.    History reviewed. No pertinent past medical history.  There are no active problems to display for this patient.   History reviewed. No pertinent surgical history.  Prior to Admission medications   Medication Sig Start Date End Date Taking? Authorizing Provider  amLODipine (NORVASC) 10 MG tablet Take 10 mg by mouth daily.    Historical Provider, MD  atenolol (TENORMIN) 25 MG tablet Take 25 mg by mouth daily.    Historical Provider, MD  cetirizine (ZYRTEC) 10 MG tablet Take 10 mg by mouth daily.    Historical Provider, MD  diazepam (VALIUM) 2 MG tablet Take 1 tablet (2 mg total) by mouth every 8 (eight) hours as needed. 11/23/16   Chinita Pesterari B Maralyn Witherell, FNP  diphenhydrAMINE (BENADRYL) 25 MG tablet Take 25 mg by mouth every 6 (six) hours as needed.    Historical Provider, MD  hydrOXYzine (ATARAX/VISTARIL) 25 MG tablet Take 25 mg by mouth 3 (three) times daily as needed.    Historical Provider, MD  naproxen (NAPROSYN) 500 MG tablet Take 1 tablet (500 mg total) by mouth 2 (two) times daily with a meal. 11/23/16   Licet Dunphy B Amarrion Pastorino, FNP  sulfamethoxazole-trimethoprim (BACTRIM DS,SEPTRA DS) 800-160 MG tablet Take 1 tablet by mouth 2 (two) times daily. 11/23/16   Chinita Pesterari B Keandre Linden, FNP    Allergies Patient has no known allergies.  No family history on file.  Social History Social History  Substance Use Topics  . Smoking  status: Former Smoker    Packs/day: 0.25    Types: Cigarettes    Quit date: 10/27/1978  . Smokeless tobacco: Never Used  . Alcohol use 0.0 oz/week     Comment: states once a wine/beer occassionally     Review of Systems Constitutional: No fever/chills ENT: No sore throat. Cardiovascular: Denies chest pain. Respiratory: Denies cough. Musculoskeletal: Positive for neck pain. Skin: Negative for rash. Neurological: Negative for headaches, focal weakness or numbness. ____________________________________________   PHYSICAL EXAM:  VITAL SIGNS: ED Triage Vitals  Enc Vitals Group     BP 11/23/16 1837 (!) 164/88     Pulse Rate 11/23/16 1837 92     Resp 11/23/16 1837 18     Temp 11/23/16 1837 98.7 F (37.1 C)     Temp Source 11/23/16 1837 Oral     SpO2 11/23/16 1837 100 %     Weight 11/23/16 1835 180 lb (81.6 kg)     Height 11/23/16 1835 5\' 5"  (1.651 m)     Head Circumference --      Peak Flow --      Pain Score 11/23/16 1835 8     Pain Loc --      Pain Edu? --      Excl. in GC? --     Constitutional: Alert and oriented. Well appearing and in no acute distress. Eyes: Conjunctivae are normal. EOMI. Head: Atraumatic. Nose: No congestion/rhinnorhea. Mouth/Throat: Mucous membranes are moist.  Neck: No stridor.  Respiratory: Normal respiratory effort.  No retractions. Musculoskeletal: Muscle tension present bilateral Trapezius muscles. Skin:  Open comedone on the left cheek near the nasal skin fold. Area is not fluctuant. Induration is present.  Psychiatric: Mood and affect are normal. Speech and behavior are normal.   ____________________________________________   LABS (all labs ordered are listed, but only abnormal results are displayed)  Labs Reviewed - No data to display ____________________________________________  EKG   ____________________________________________  RADIOLOGY  Not  indicated. ____________________________________________   PROCEDURES  Procedure(s) performed: None  Procedures  Critical Care performed: No  ____________________________________________   INITIAL IMPRESSION / ASSESSMENT AND PLAN / ED COURSE  Pertinent labs & imaging results that were available during my care of the patient were reviewed by me and considered in my medical decision making (see chart for details).  61 year old female presenting to the emergency department for treatment of acute torticollis and a tender pimple that has increased in size over the past few days. She is to purchase benzyl peroxide ointment over-the-counter and apply to the area as indicated on the packaging. She is to follow-up with the dermatologist for symptoms that are not improving over the next couple of days or worsen. She will be given a prescription for Valium 2 mg tablets for the acute torticollis. She was instructed to follow-up with the primary care provider if the symptoms do not improve over the next few days. She was also advised that she may take ibuprofen or Tylenol in addition to the Valium. She was advised to return to the emergency department for symptoms that change or worsen if she is unable schedule an appointment.  ____________________________________________   FINAL CLINICAL IMPRESSION(S) / ED DIAGNOSES  Final diagnoses:  [place]  Acute torticollis      NEW MEDICATIONS STARTED DURING THIS VISIT:  Discharge Medication List as of 11/23/2016  7:33 PM    START taking these medications   Details  diazepam (VALIUM) 2 MG tablet Take 1 tablet (2 mg total) by mouth every 8 (eight) hours as needed., Starting Wed 11/23/2016, Print    naproxen (NAPROSYN) 500 MG tablet Take 1 tablet (500 mg total) by mouth 2 (two) times daily with a meal., Starting Wed 11/23/2016, Print    sulfamethoxazole-trimethoprim (BACTRIM DS,SEPTRA DS) 800-160 MG tablet Take 1 tablet by mouth 2 (two) times daily.,  Starting Wed 11/23/2016, Print         Note:  This document was prepared using Dragon voice recognition software and may include unintentional dictation errors.    Chinita Pester, FNP 11/23/16 2038    Merrily Brittle, MD 11/23/16 [phone]

## 2016-11-23 NOTE — ED Triage Notes (Signed)
Pt with neck pain for one week

## 2016-11-23 NOTE — Discharge Instructions (Signed)
Use an over the counter cream that contains benzoil peroxide just on the tender, raised area on your face.

## 2016-11-23 NOTE — ED Notes (Signed)
See triage note  Having pain to neck and back  No fever  Sx's started about 1 week ago

## 2017-04-03 ENCOUNTER — Ambulatory Visit: Payer: Self-pay | Attending: Oncology

## 2017-10-09 ENCOUNTER — Ambulatory Visit: Payer: Self-pay

## 2017-12-20 ENCOUNTER — Ambulatory Visit: Payer: Self-pay | Attending: Oncology

## 2017-12-20 ENCOUNTER — Ambulatory Visit
Admission: RE | Admit: 2017-12-20 | Discharge: 2017-12-20 | Disposition: A | Discharge status: Home / Self Care | Payer: Self-pay | Source: Ambulatory Visit | Attending: Oncology | Admitting: Oncology

## 2017-12-20 VITALS — BP 109/72 | HR 73 | Temp 97.9°F | Ht 66.0 in | Wt 189.0 lb

## 2017-12-20 DIAGNOSIS — Z0000 Encounter for general adult medical examination without abnormal findings: Secondary | ICD-10-CM

## 2017-12-20 IMAGING — MG MM DIGITAL SCREENING BILAT W/ TOMO W/ CAD
8 of 13 series · 8 of 29 positions shown · non-contrast
Comparison: Previous exam(s).

CLINICAL DATA: Screening.

EXAM:
DIGITAL SCREENING BILATERAL MAMMOGRAM WITH TOMO AND CAD

[R CC (1 of 2)]
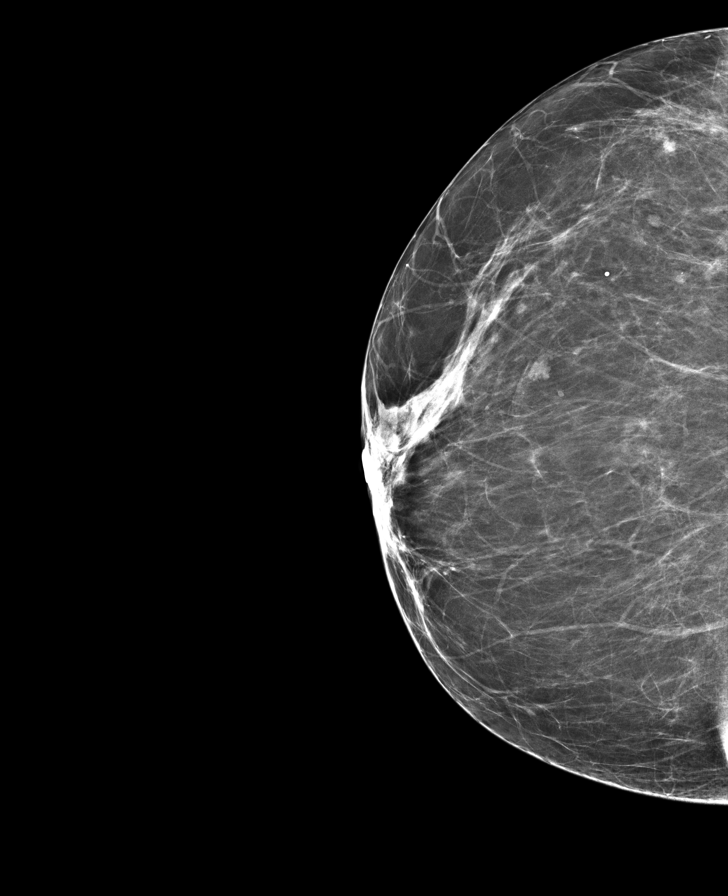

[R MLO synth-2D]
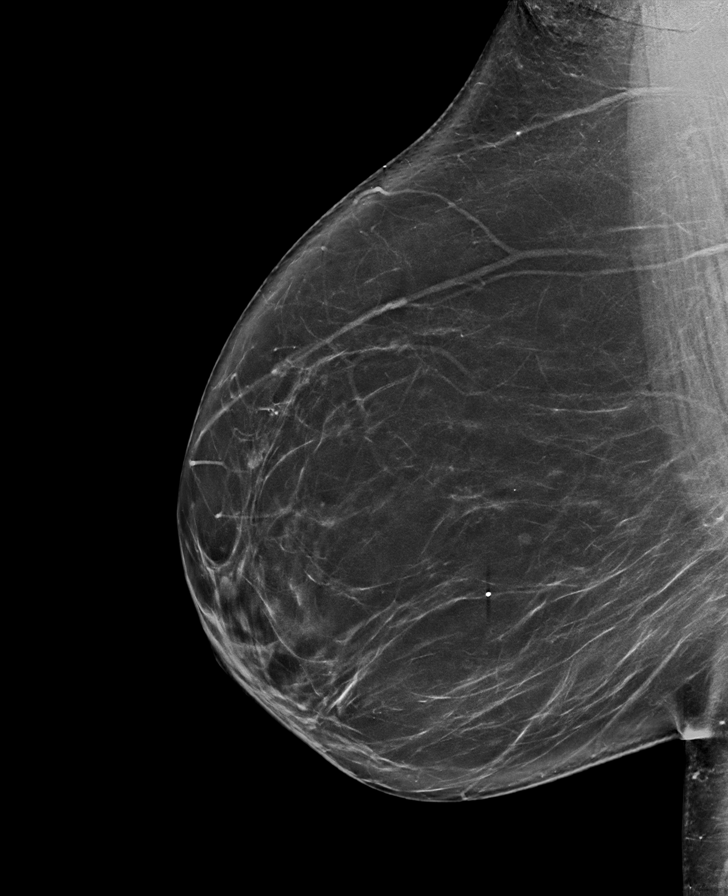

[L MLO synth-2D]
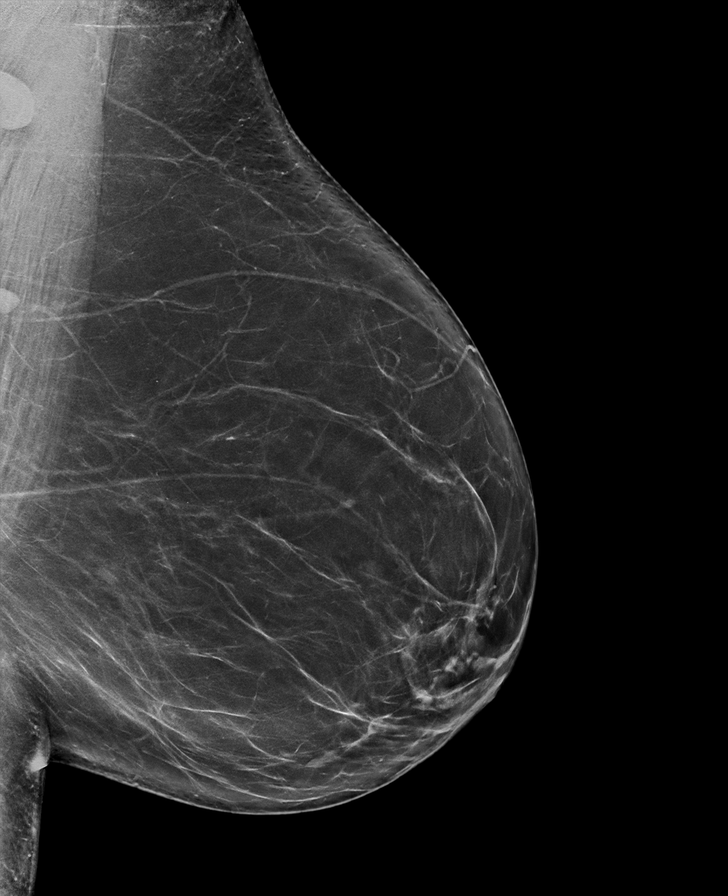

[L CC synth-2D]
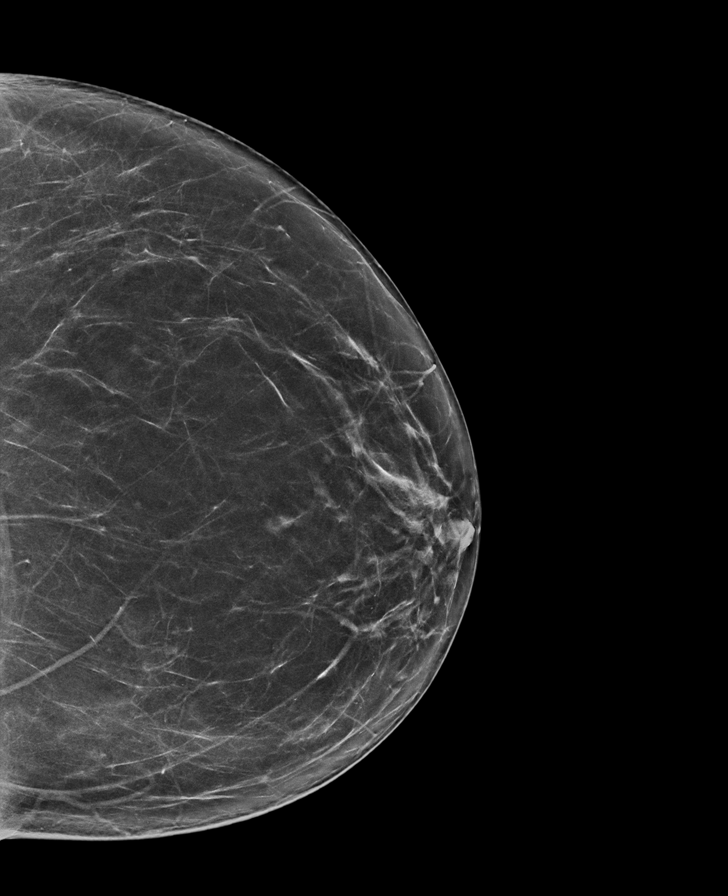

[R CC (2 of 2)]
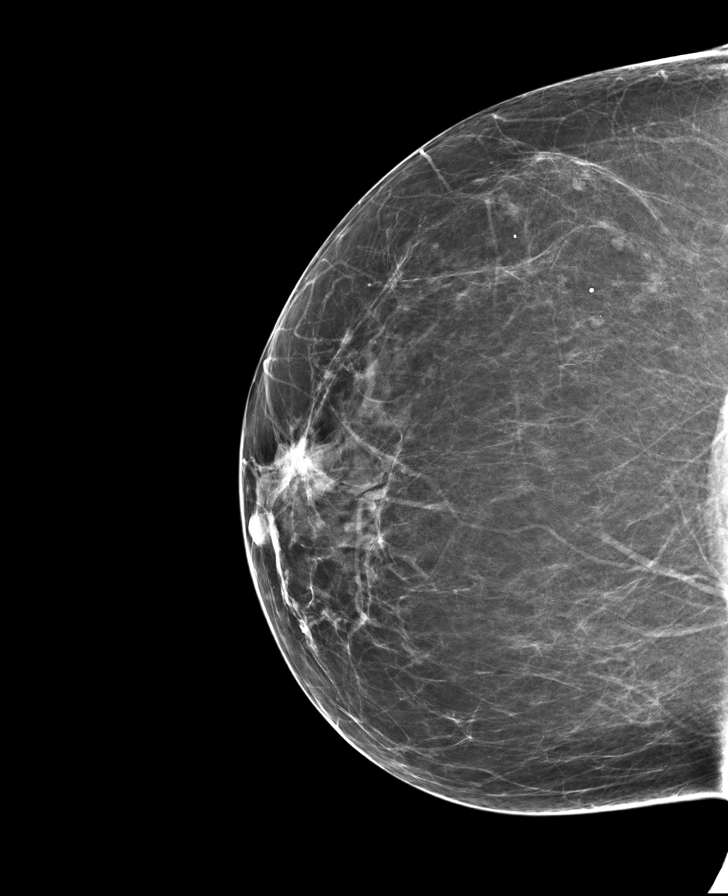

[R MLO]
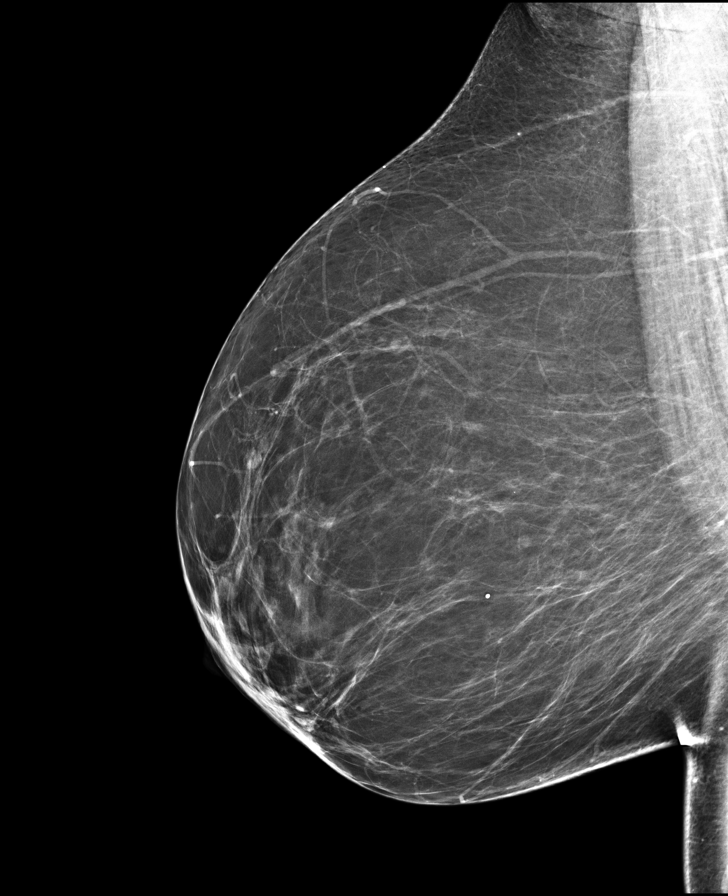

[L CC]
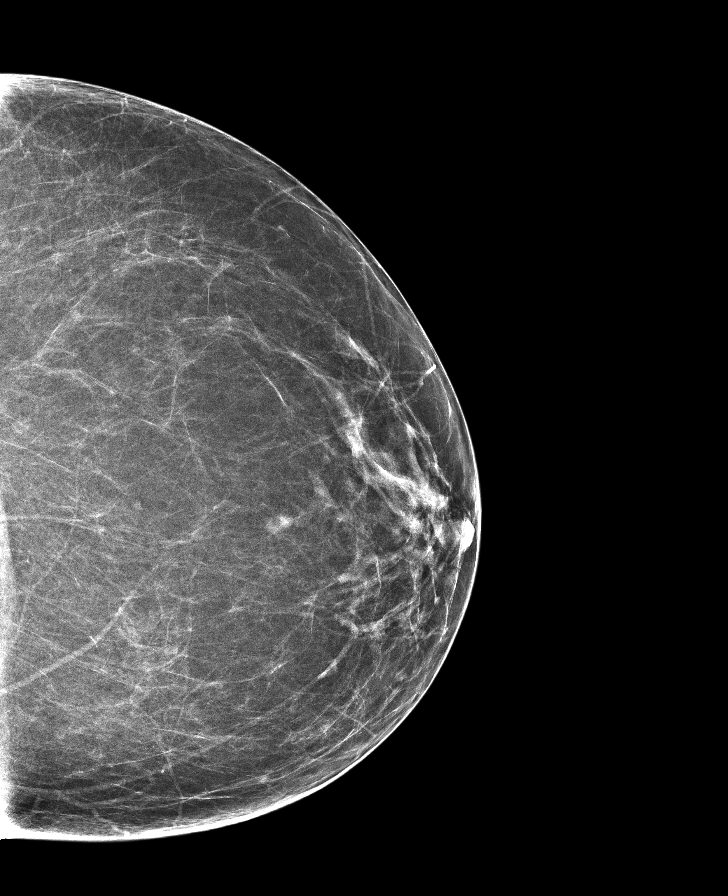

[R CC synth-2D]
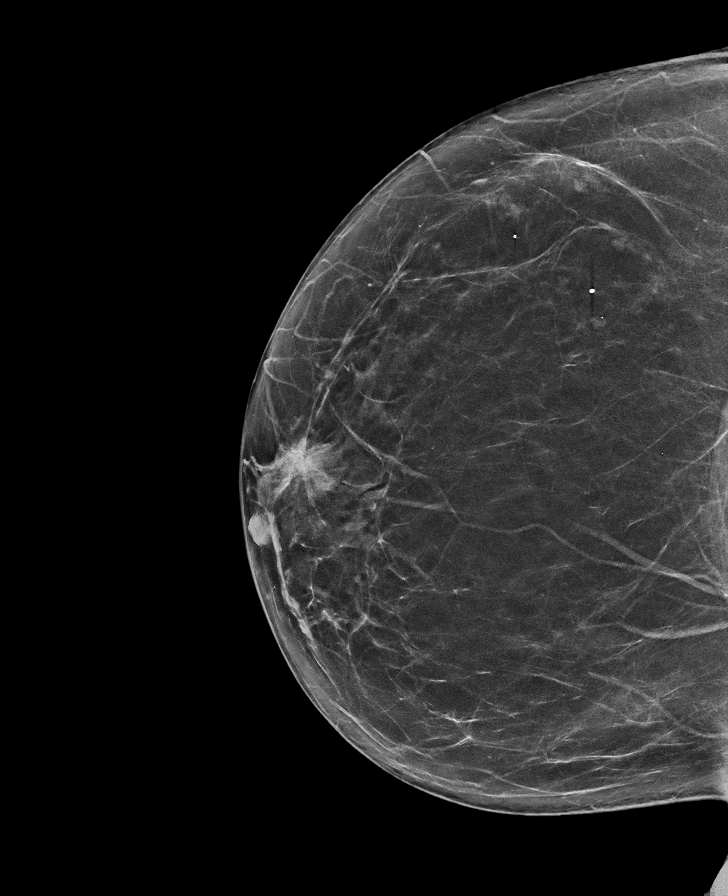

[8 of 29 positions shown; findings below may reference images not displayed]

ACR Breast Density Category b: There are scattered areas of
fibroglandular density.
FINDINGS: There are no findings suspicious for malignancy. Images were
processed with CAD.
IMPRESSION: No mammographic evidence of malignancy. A result letter of this
screening mammogram will be mailed directly to the patient.

RECOMMENDATION:
Screening mammogram in one year. (Code:[TQ])

BI-RADS CATEGORY  1: Negative.

## 2017-12-20 NOTE — Progress Notes (Signed)
Subjective:     Patient ID: Guerry BruinColleen Salomon, female   DOB: [birth year], 62 y.o.   MRN: [id number]  HPI   Review of Systems     Objective:   Physical Exam  Pulmonary/Chest: Right breast exhibits no inverted nipple, no mass, no nipple discharge, no skin change and no tenderness. Left breast exhibits no inverted nipple, no mass, no nipple discharge, no skin change and no tenderness. Breasts are symmetrical.  Large pendulous breasts       Assessment:     62 [facility].  Patient moved her 4 years ago from [place] to be with daughter, and grandchildren.  She takes care of 62 year old granddaughter weekdays, and works at [employer] on weekends.  Patient screened, and meets [facility] eligibility.  Patient does not have insurance, Medicare or Medicaid.  Handout given on Affordable Care Act.  Instructed patient on breast self awareness using teach back method.  CBE unremarkable.  No mass or lump palpated.      Plan:     Sent for bilateral screening mammogram.

## 2017-12-21 NOTE — Progress Notes (Signed)
Letter mailed from [facility] to notify of normal mammogram results.  Patient to return in one year for annual screening.  Copy to [facility]. 

## 2019-02-23 ENCOUNTER — Encounter: Payer: Self-pay | Admitting: Emergency Medicine

## 2019-02-23 ENCOUNTER — Other Ambulatory Visit: Payer: Self-pay

## 2019-02-23 ENCOUNTER — Emergency Department
Admission: EM | Admit: 2019-02-23 | Discharge: 2019-02-23 | Disposition: A | Discharge status: Home / Self Care | Payer: Self-pay | Attending: Emergency Medicine | Admitting: Emergency Medicine

## 2019-02-23 DIAGNOSIS — Z87891 Personal history of nicotine dependence: Secondary | ICD-10-CM | POA: Insufficient documentation

## 2019-02-23 DIAGNOSIS — Z79899 Other long term (current) drug therapy: Secondary | ICD-10-CM | POA: Insufficient documentation

## 2019-02-23 DIAGNOSIS — I10 Essential (primary) hypertension: Secondary | ICD-10-CM | POA: Insufficient documentation

## 2019-02-23 DIAGNOSIS — J019 Acute sinusitis, unspecified: Secondary | ICD-10-CM | POA: Insufficient documentation

## 2019-02-23 HISTORY — DX: Essential (primary) hypertension: I10

## 2019-02-23 MED ORDER — CETIRIZINE HCL 10 MG PO TABS: 10.0000 mg | ORAL_TABLET | Freq: Every day | ORAL | 0 refills | Status: DC

## 2019-02-23 MED ORDER — OXYMETAZOLINE HCL 0.05 % NA SOLN: 2.0000 | Freq: Two times a day (BID) | NASAL | 0 refills | Status: AC

## 2019-02-23 NOTE — ED Provider Notes (Signed)
[facility] Emergency Department Provider Note  ____________________________________________  Time seen: Approximately 10:52 AM  I have reviewed the triage vital signs and the nursing notes.   HISTORY  Chief Complaint Sore Throat and Nasal Congestion   HPI Lindsey [name] is a 63 y.o. female with history of hypertension who presents for evaluation of congestion.  Patient reports that her symptoms started yesterday.  She is complaining of pressure on her nose, burning sensation in her nose, rhinorrhea, and a mild frontal HA which resolved after taking tylenol at home.  No fever, no chills, no vomiting, no cough, no chest pain or shortness of breath.  No known exposure to COVID.  Past Medical History:  Diagnosis Date  . Hypertension    Prior to Admission medications   Medication Sig Start Date End Date Taking? Authorizing Provider  amLODipine (NORVASC) 10 MG tablet Take 10 mg by mouth daily.    [provider]  atenolol (TENORMIN) 25 MG tablet Take 25 mg by mouth daily.    [provider]  cetirizine (ZYRTEC) 10 MG tablet Take 1 tablet (10 mg total) by mouth daily for 7 days. 02/23/19 03/02/19  Nita Sickle, MD  diazepam (VALIUM) 2 MG tablet Take 1 tablet (2 mg total) by mouth every 8 (eight) hours as needed. 11/23/16   Triplett, Rulon Eisenmenger B, FNP  diphenhydrAMINE (BENADRYL) 25 MG tablet Take 25 mg by mouth every 6 (six) hours as needed.    [provider]  hydrOXYzine (ATARAX/VISTARIL) 25 MG tablet Take 25 mg by mouth 3 (three) times daily as needed.    [provider]  naproxen (NAPROSYN) 500 MG tablet Take 1 tablet (500 mg total) by mouth 2 (two) times daily with a meal. 11/23/16   Triplett, Cari B, FNP  oxymetazoline (AFRIN) 0.05 % nasal spray Place 2 sprays into both nostrils 2 (two) times daily. 02/23/19 02/23/20  Nita Sickle, MD  sulfamethoxazole-trimethoprim (BACTRIM DS,SEPTRA DS) 800-160 MG tablet Take 1 tablet by  mouth 2 (two) times daily. 11/23/16   Chinita Pester, FNP    Allergies Patient has no known allergies.  No family history on file.  Social History Social History   Tobacco Use  . Smoking status: Former Smoker    Packs/day: 0.25    Types: Cigarettes    Last attempt to quit: 10/27/1978    Years since quitting: 40.3  . Smokeless tobacco: Never Used  Substance Use Topics  . Alcohol use: Yes    Alcohol/week: 0.0 standard drinks    Comment: states once a wine/beer occassionally   . Drug use: No    Review of Systems  Constitutional: Negative for fever. Eyes: Negative for visual changes. + Congestion and rhinorrhea ENT: Negative for sore throat. Neck: No neck pain  Cardiovascular: Negative for chest pain. Respiratory: Negative for shortness of breath. Gastrointestinal: Negative for abdominal pain, vomiting or diarrhea. Genitourinary: Negative for dysuria. Musculoskeletal: Negative for back pain. Skin: Negative for rash. Neurological: Negative for weakness or numbness. + HA Psych: No SI or HI  ____________________________________________   PHYSICAL EXAM:  VITAL SIGNS: ED Triage Vitals  Enc Vitals Group     BP 02/23/19 0940 140/89     Pulse Rate 02/23/19 0940 76     Resp 02/23/19 0940 18     Temp 02/23/19 0940 98.4 F (36.9 C)     Temp Source 02/23/19 0940 Oral     SpO2 02/23/19 0940 100 %     Weight --  Height --      Head Circumference --      Peak Flow --      Pain Score 02/23/19 0941 6     Pain Loc --      Pain Edu? --      Excl. in GC? --     Constitutional: Alert and oriented. Well appearing and in no apparent distress. HEENT:      Head: Normocephalic and atraumatic.         Eyes: Conjunctivae are normal. Sclera is non-icteric.       Mouth/Throat: Mucous membranes are moist.       Neck: Supple with no signs of meningismus. Cardiovascular: Regular rate and rhythm. No murmurs, gallops, or rubs. 2+ symmetrical distal pulses are present in all  extremities. No JVD. Respiratory: Normal respiratory effort. Lungs are clear to auscultation bilaterally. No wheezes, crackles, or rhonchi.  Gastrointestinal: Soft, non tender, and non distended with positive bowel sounds. No rebound or guarding. Musculoskeletal: Nontender with normal range of motion in all extremities. No edema, cyanosis, or erythema of extremities. Neurologic: Normal speech and language. Face is symmetric. Moving all extremities. No gross focal neurologic deficits are appreciated. Skin: Skin is warm, dry and intact. No rash noted. Psychiatric: Mood and affect are normal. Speech and behavior are normal.  ____________________________________________   LABS (all labs ordered are listed, but only abnormal results are displayed)  Labs Reviewed - No data to display ____________________________________________  EKG  none  ____________________________________________  RADIOLOGY  none  ____________________________________________   PROCEDURES  Procedure(s) performed: None Procedures Critical Care performed:  None ____________________________________________   INITIAL IMPRESSION / ASSESSMENT AND PLAN / ED COURSE  63 y.o. female with history of hypertension who presents for evaluation of congestion, rhinorrhea and a frontal headache consistent with acute sinusitis.  Patient is otherwise extremely well-appearing with normal vital signs, normal work of breathing, no lower respiratory symptoms.  Patient was started on Zyrtec and Afrin.  Discharged home with follow-up with PCP.  Discussed my standard return precautions      As part of my medical decision making, I reviewed the following data within the electronic MEDICAL RECORD NUMBER Nursing notes reviewed and incorporated, Old chart reviewed, Notes from prior ED visits and  Controlled Substance Database    Pertinent labs & imaging results that were available during my care of the patient were reviewed by me and  considered in my medical decision making (see chart for details).    ____________________________________________   FINAL CLINICAL IMPRESSION(S) / ED DIAGNOSES  Final diagnoses:  Acute non-recurrent sinusitis, unspecified location      NEW MEDICATIONS STARTED DURING THIS VISIT:  ED Discharge Orders         Ordered    cetirizine (ZYRTEC) 10 MG tablet  Daily     02/23/19 1003    oxymetazoline (AFRIN) 0.05 % nasal spray  2 times daily     02/23/19 1003           Note:  This document was prepared using Dragon voice recognition software and may include unintentional dictation errors.    Don PerkingVeronese, [place], MD 02/23/19 [phone]

## 2019-02-23 NOTE — ED Triage Notes (Signed)
Patient presents to the ED with nasal congestion and sore throat x 1 week.  Patient states when she breathes in with her nose she feels a burning sensation and smells a bad smell.  Patient denies chest pain and shortness of breath.  Patient is in no obvious distress at this time.  Ambulatory with steady gait to triage.

## 2019-04-26 ENCOUNTER — Emergency Department
Admission: EM | Admit: 2019-04-26 | Discharge: 2019-04-26 | Disposition: A | Discharge status: Home / Self Care | Payer: Self-pay | Attending: Emergency Medicine | Admitting: Emergency Medicine

## 2019-04-26 ENCOUNTER — Other Ambulatory Visit: Payer: Self-pay

## 2019-04-26 DIAGNOSIS — Z79899 Other long term (current) drug therapy: Secondary | ICD-10-CM | POA: Insufficient documentation

## 2019-04-26 DIAGNOSIS — B029 Zoster without complications: Secondary | ICD-10-CM | POA: Insufficient documentation

## 2019-04-26 DIAGNOSIS — Z87891 Personal history of nicotine dependence: Secondary | ICD-10-CM | POA: Insufficient documentation

## 2019-04-26 DIAGNOSIS — I10 Essential (primary) hypertension: Secondary | ICD-10-CM | POA: Insufficient documentation

## 2019-04-26 MED ORDER — ONDANSETRON HCL 4 MG PO TABS: 4.0000 mg | ORAL_TABLET | Freq: Three times a day (TID) | ORAL | 0 refills | Status: DC | PRN

## 2019-04-26 MED ORDER — VALACYCLOVIR HCL 1 G PO TABS: 1000.0000 mg | ORAL_TABLET | Freq: Two times a day (BID) | ORAL | 0 refills | Status: AC

## 2019-04-26 MED ORDER — ONDANSETRON HCL 4 MG PO TABS: 4.0000 mg | ORAL_TABLET | Freq: Three times a day (TID) | ORAL | 0 refills | Status: AC | PRN

## 2019-04-26 MED ORDER — OXYCODONE-ACETAMINOPHEN 5-325 MG PO TABS: 1.0000 | ORAL_TABLET | Freq: Four times a day (QID) | ORAL | 0 refills | Status: DC | PRN

## 2019-04-26 MED ORDER — OXYCODONE-ACETAMINOPHEN 5-325 MG PO TABS: 1.0000 | ORAL_TABLET | Freq: Four times a day (QID) | ORAL | 0 refills | Status: AC | PRN

## 2019-04-26 MED ORDER — OXYCODONE-ACETAMINOPHEN 5-325 MG PO TABS
1.0000 | ORAL_TABLET | Freq: Once | ORAL | Status: AC
  Administered 2019-04-26: 1 via ORAL
  Filled 2019-04-26: qty 1

## 2019-04-26 MED ORDER — VALACYCLOVIR HCL 500 MG PO TABS: 1000.0000 mg | ORAL_TABLET | Freq: Every day | ORAL | Status: DC

## 2019-04-26 NOTE — ED Provider Notes (Signed)
[facility] Emergency Department Provider Note  ____________________________________________  Time seen: Approximately 11:31 PM  I have reviewed the triage vital signs and the nursing notes.   HISTORY  Chief Complaint Herpes Zoster    HPI Lindsey BruinColleen [name] is a 63 y.o. female presents to the emergency department with a vesicular rash that started with a prodrome of burning and tingling.  Rash is vesicular and erythematous in a dermatomal distribution.  Patient states that she has never had a rash like this in the past.  She did have varicella as a child.  She denies recent stress.  No other alleviating measures have been attempted.        Past Medical History:  Diagnosis Date  . Hypertension     There are no active problems to display for this patient.   No past surgical history on file.  Prior to Admission medications   Medication Sig Start Date End Date Taking? Authorizing Provider  amLODipine (NORVASC) 10 MG tablet Take 10 mg by mouth daily.    [provider]  atenolol (TENORMIN) 25 MG tablet Take 25 mg by mouth daily.    [provider]  cetirizine (ZYRTEC) 10 MG tablet Take 1 tablet (10 mg total) by mouth daily for 7 days. 02/23/19 03/02/19  Nita SickleVeronese, [place], MD  diazepam (VALIUM) 2 MG tablet Take 1 tablet (2 mg total) by mouth every 8 (eight) hours as needed. 11/23/16   Triplett, Rulon Eisenmengerari B, FNP  diphenhydrAMINE (BENADRYL) 25 MG tablet Take 25 mg by mouth every 6 (six) hours as needed.    [provider]  hydrOXYzine (ATARAX/VISTARIL) 25 MG tablet Take 25 mg by mouth 3 (three) times daily as needed.    [provider]  naproxen (NAPROSYN) 500 MG tablet Take 1 tablet (500 mg total) by mouth 2 (two) times daily with a meal. 11/23/16   Triplett, Cari B, FNP  ondansetron (ZOFRAN) 4 MG tablet Take 1 tablet (4 mg total) by mouth every 8 (eight) hours as needed for up to 5 days for nausea or vomiting. 04/26/19 05/01/19  Orvil FeilWoods,  Nyeema Want M, PA-C  oxyCODONE-acetaminophen (PERCOCET/ROXICET) 5-325 MG tablet Take 1 tablet by mouth every 6 (six) hours as needed for up to 3 days for severe pain. 04/26/19 04/29/19  Orvil FeilWoods, Li Fragoso M, PA-C  oxymetazoline (AFRIN) 0.05 % nasal spray Place 2 sprays into both nostrils 2 (two) times daily. 02/23/19 02/23/20  Nita SickleVeronese, [place], MD  sulfamethoxazole-trimethoprim (BACTRIM DS,SEPTRA DS) 800-160 MG tablet Take 1 tablet by mouth 2 (two) times daily. 11/23/16   Triplett, Rulon Eisenmengerari B, FNP  valACYclovir (VALTREX) 1000 MG tablet Take 1 tablet (1,000 mg total) by mouth 2 (two) times daily for 10 days. 04/26/19 05/06/19  Orvil FeilWoods, Linzee Depaul M, PA-C    Allergies Patient has no known allergies.  No family history on file.  Social History Social History   Tobacco Use  . Smoking status: Former Smoker    Packs/day: 0.25    Types: Cigarettes    Quit date: 10/27/1978    Years since quitting: 40.5  . Smokeless tobacco: Never Used  Substance Use Topics  . Alcohol use: Yes    Alcohol/week: 0.0 standard drinks    Comment: states once a wine/beer occassionally   . Drug use: No     Review of Systems  Constitutional: No fever/chills Eyes: No visual changes. No discharge ENT: No upper respiratory complaints. Cardiovascular: no chest pain. Respiratory: no cough. No SOB. Gastrointestinal: No abdominal pain.  No nausea, no vomiting.  No diarrhea.  No constipation. Musculoskeletal: Negative for musculoskeletal pain. Skin: Patient has rash.  Neurological: Negative for headaches, focal weakness or numbness.  ____________________________________________   PHYSICAL EXAM:  VITAL SIGNS: ED Triage Vitals  Enc Vitals Group     BP 04/26/19 2029 (!) 143/77     Pulse Rate 04/26/19 2029 87     Resp 04/26/19 2029 16     Temp 04/26/19 2029 98 F (36.7 C)     Temp Source 04/26/19 2029 Oral     SpO2 04/26/19 2029 100 %     Weight 04/26/19 2030 156 lb (70.8 kg)     Height 04/26/19 2030 5\' 6"  (1.676 m)     Head  Circumference --      Peak Flow --      Pain Score 04/26/19 2029 8     Pain Loc --      Pain Edu? --      Excl. in GC? --      Constitutional: Alert and oriented. Well appearing and in no acute distress. Eyes: Conjunctivae are normal. PERRL. EOMI. Head: Atraumatic. Cardiovascular: Normal rate, regular rhythm. Normal S1 and S2.  Good peripheral circulation. Respiratory: Normal respiratory effort without tachypnea or retractions. Lungs CTAB. Good air entry to the bases with no decreased or absent breath sounds. Gastrointestinal: Bowel sounds 4 quadrants. Soft and nontender to palpation. No guarding or rigidity. No palpable masses. No distention. No CVA tenderness. Musculoskeletal: Full range of motion to all extremities. No gross deformities appreciated. Neurologic:  Normal speech and language. No gross focal neurologic deficits are appreciated.  Skin: Patient has dermatomal rash along the back and breast.  Rash is vesicular and erythematous in nature. Psychiatric: Mood and affect are normal. Speech and behavior are normal. Patient exhibits appropriate insight and judgement.   ____________________________________________   LABS (all labs ordered are listed, but only abnormal results are displayed)  Labs Reviewed - No data to display ____________________________________________  EKG   ____________________________________________  RADIOLOGY   No results found.  ____________________________________________    PROCEDURES  Procedure(s) performed:    Procedures    Medications  oxyCODONE-acetaminophen (PERCOCET/ROXICET) 5-325 MG per tablet 1 tablet (has no administration in time range)  valACYclovir (VALTREX) tablet 1,000 mg (has no administration in time range)     ____________________________________________   INITIAL IMPRESSION / ASSESSMENT AND PLAN / ED COURSE  Pertinent labs & imaging results that were available during my care of the patient were reviewed  by me and considered in my medical decision making (see chart for details).  Review of the [place] CSRS was performed in accordance of the [facility] prior to dispensing any controlled drugs.           Assessment and plan Shingles 63 year old female presents to the emergency department with an erythematous vesicular rash along the back and breast that is been present for the past 2 days.  Rash is consistent with zoster.  Patient was started on Valtrex and was given a short course of Percocet.  All patient questions were answered.    ____________________________________________  FINAL CLINICAL IMPRESSION(S) / ED DIAGNOSES  Final diagnoses:  Herpes zoster without complication      NEW MEDICATIONS STARTED DURING THIS VISIT:  ED Discharge Orders         Ordered    oxyCODONE-acetaminophen (PERCOCET/ROXICET) 5-325 MG tablet  Every 6 hours PRN,   Status:  Discontinued     04/26/19 2314    ondansetron (ZOFRAN) 4 MG tablet  Every  8 hours PRN,   Status:  Discontinued     04/26/19 2314    oxyCODONE-acetaminophen (PERCOCET/ROXICET) 5-325 MG tablet  Every 6 hours PRN,   Status:  Discontinued     04/26/19 2314    ondansetron (ZOFRAN) 4 MG tablet  Every 8 hours PRN,   Status:  Discontinued     04/26/19 2314    oxyCODONE-acetaminophen (PERCOCET/ROXICET) 5-325 MG tablet  Every 6 hours PRN     04/26/19 2318    valACYclovir (VALTREX) 1000 MG tablet  2 times daily     04/26/19 2318    ondansetron (ZOFRAN) 4 MG tablet  Every 8 hours PRN     04/26/19 2318              This chart was dictated using voice recognition software/Dragon. Despite best efforts to proofread, errors can occur which can change the meaning. Any change was purely unintentional.    Lannie Fields, PA-C 04/26/19 2333    Harvest Dark, MD 04/30/19 [phone]

## 2019-04-26 NOTE — ED Triage Notes (Signed)
Pt states she is here for shingles. Vesicular like rash noted to left chest wrapping around to left back. Pt states began 2 days ago and denies fever.

## 2019-04-27 NOTE — ED Notes (Signed)
No peripheral IV placed this visit.    Discharge instructions reviewed with patient. Questions fielded by this RN. Patient verbalizes understanding of instructions. Patient discharged home in stable condition per jackie, pa. No acute distress noted at time of discharge.   

## 2019-05-02 ENCOUNTER — Other Ambulatory Visit: Payer: Self-pay

## 2019-05-02 ENCOUNTER — Emergency Department
Admission: EM | Admit: 2019-05-02 | Discharge: 2019-05-02 | Disposition: A | Discharge status: Home / Self Care | Payer: Self-pay | Attending: Emergency Medicine | Admitting: Emergency Medicine

## 2019-05-02 DIAGNOSIS — Z87891 Personal history of nicotine dependence: Secondary | ICD-10-CM | POA: Insufficient documentation

## 2019-05-02 DIAGNOSIS — B029 Zoster without complications: Secondary | ICD-10-CM | POA: Insufficient documentation

## 2019-05-02 DIAGNOSIS — I10 Essential (primary) hypertension: Secondary | ICD-10-CM | POA: Insufficient documentation

## 2019-05-02 DIAGNOSIS — B028 Zoster with other complications: Secondary | ICD-10-CM

## 2019-05-02 DIAGNOSIS — Z79899 Other long term (current) drug therapy: Secondary | ICD-10-CM | POA: Insufficient documentation

## 2019-05-02 MED ORDER — HYDROXYZINE HCL 25 MG PO TABS: 25.0000 mg | ORAL_TABLET | Freq: Three times a day (TID) | ORAL | 0 refills | Status: DC | PRN

## 2019-05-02 MED ORDER — HYDROXYZINE HCL 50 MG PO TABS
50.0000 mg | ORAL_TABLET | Freq: Once | ORAL | Status: AC
  Administered 2019-05-02: 13:00:00 50 mg via ORAL
  Filled 2019-05-02: qty 1

## 2019-05-02 MED ORDER — LIDOCAINE 5 % EX PTCH: 1.0000 | MEDICATED_PATCH | Freq: Two times a day (BID) | CUTANEOUS | 0 refills | Status: AC

## 2019-05-02 MED ORDER — LIDOCAINE 5 % EX PTCH
2.0000 | MEDICATED_PATCH | CUTANEOUS | Status: DC
  Administered 2019-05-02: 13:00:00 2 via TRANSDERMAL
  Filled 2019-05-02: qty 2

## 2019-05-02 MED ORDER — OXYCODONE-ACETAMINOPHEN 5-325 MG PO TABS: 1.0000 | ORAL_TABLET | Freq: Four times a day (QID) | ORAL | 0 refills | Status: DC | PRN

## 2019-05-02 NOTE — ED Provider Notes (Signed)
[facility] Emergency Department Provider Note   ____________________________________________   First MD Initiated Contact with Patient 05/02/19 1215     (approximate)  I have reviewed the triage vital signs and the nursing notes.   HISTORY  Chief Complaint Herpes Zoster    HPI Lindsey [name] is a 63 y.o. female patient return to the ED status post 6 days of being diagnosed with herpes zoster.  Patient stated no noticeable relief and new lesions while taking Valtrex.  Patient rates the pain as a 9/10.  Patient described the pain as "burning".  Lesion started in the center of the back and radiates to the left side to include under left breast.  No other palliative measure for complaint.      Past Medical History:  Diagnosis Date  . Hypertension     There are no active problems to display for this patient.   No past surgical history on file.  Prior to Admission medications   Medication Sig Start Date End Date Taking? Authorizing Provider  amLODipine (NORVASC) 10 MG tablet Take 10 mg by mouth daily.    [provider]  atenolol (TENORMIN) 25 MG tablet Take 25 mg by mouth daily.    [provider]  cetirizine (ZYRTEC) 10 MG tablet Take 1 tablet (10 mg total) by mouth daily for 7 days. 02/23/19 03/02/19  Rudene Re, MD  diazepam (VALIUM) 2 MG tablet Take 1 tablet (2 mg total) by mouth every 8 (eight) hours as needed. 11/23/16   Triplett, Johnette Abraham B, FNP  diphenhydrAMINE (BENADRYL) 25 MG tablet Take 25 mg by mouth every 6 (six) hours as needed.    [provider]  hydrOXYzine (ATARAX/VISTARIL) 25 MG tablet Take 25 mg by mouth 3 (three) times daily as needed.    [provider]  hydrOXYzine (ATARAX/VISTARIL) 25 MG tablet Take 1 tablet (25 mg total) by mouth 3 (three) times daily as needed. 05/02/19   Sable Feil, PA-C  lidocaine (LIDODERM) 5 % Place 1 patch onto the skin every 12 (twelve) hours for 5 days. Remove &  Discard patch within 12 hours or as directed by MD 05/02/19 05/07/19  Sable Feil, PA-C  naproxen (NAPROSYN) 500 MG tablet Take 1 tablet (500 mg total) by mouth 2 (two) times daily with a meal. 11/23/16   Triplett, Cari B, FNP  oxyCODONE-acetaminophen (PERCOCET) 5-325 MG tablet Take 1 tablet by mouth every 6 (six) hours as needed for severe pain. 05/02/19   Sable Feil, PA-C  oxymetazoline (AFRIN) 0.05 % nasal spray Place 2 sprays into both nostrils 2 (two) times daily. 02/23/19 02/23/20  Rudene Re, MD  sulfamethoxazole-trimethoprim (BACTRIM DS,SEPTRA DS) 800-160 MG tablet Take 1 tablet by mouth 2 (two) times daily. 11/23/16   Triplett, Johnette Abraham B, FNP  valACYclovir (VALTREX) 1000 MG tablet Take 1 tablet (1,000 mg total) by mouth 2 (two) times daily for 10 days. 04/26/19 05/06/19  Lannie Fields, PA-C    Allergies Patient has no known allergies.  No family history on file.  Social History Social History   Tobacco Use  . Smoking status: Former Smoker    Packs/day: 0.25    Types: Cigarettes    Quit date: 10/27/1978    Years since quitting: 40.5  . Smokeless tobacco: Never Used  Substance Use Topics  . Alcohol use: Yes    Alcohol/week: 0.0 standard drinks    Comment: states once a wine/beer occassionally   . Drug use: No  Review of Systems Constitutional: No fever/chills Eyes: No visual changes. ENT: No sore throat. Cardiovascular: Denies chest pain. Respiratory: Denies shortness of breath. Gastrointestinal: No abdominal pain.  No nausea, no vomiting.  No diarrhea.  No constipation. Genitourinary: Negative for dysuria. Musculoskeletal: Negative for back pain. Skin: Positive for rash. Neurological: Negative for headaches, focal weakness or numbness. Endocrine:  Hypertension ____________________________________________   PHYSICAL EXAM:  VITAL SIGNS: ED Triage Vitals  Enc Vitals Group     BP 05/02/19 1222 124/72     Pulse Rate 05/02/19 1222 67     Resp --      Temp  05/02/19 1222 98.3 F (36.8 C)     Temp Source 05/02/19 1222 Oral     SpO2 05/02/19 1222 98 %     Weight 05/02/19 1214 150 lb (68 kg)     Height 05/02/19 1214 5\' 6"  (1.676 m)     Head Circumference --      Peak Flow --      Pain Score 05/02/19 1214 9     Pain Loc --      Pain Edu? --      Excl. in GC? --    Constitutional: Alert and oriented. Well appearing and in no acute distress. Cardiovascular: Normal rate, regular rhythm. Grossly normal heart sounds.  Good peripheral circulation. Respiratory: Normal respiratory effort.  No retractions. Lungs CTAB. Skin:  Skin is warm, dry and intact.  Multiple vesicular lesion radiating from the center of the back to the left anterior chest wall. Psychiatric: Mood and affect are normal. Speech and behavior are normal.  ____________________________________________   LABS (all labs ordered are listed, but only abnormal results are displayed)  Labs Reviewed - No data to display ____________________________________________  EKG   ____________________________________________  RADIOLOGY  ED MD interpretation:    Official radiology report(s): No results found.  ____________________________________________   PROCEDURES  Procedure(s) performed (including Critical Care):  Procedures   ____________________________________________   INITIAL IMPRESSION / ASSESSMENT AND PLAN / ED COURSE  As part of my medical decision making, I reviewed the following data within the electronic MEDICAL RECORD NUMBER         Lindsey [name] was evaluated in Emergency Department on 05/02/2019 for the symptoms described in the history of present illness. She was evaluated in the context of the global COVID-19 pandemic, which necessitated consideration that the patient might be at risk for infection with the SARS-CoV-2 virus that causes COVID-19. Institutional protocols and algorithms that pertain to the evaluation of patients at risk for COVID-19 are in a state  of rapid change based on information released by regulatory bodies including the CDC and federal and state organizations. These policies and algorithms were followed during the patient's care in the ED.  Patient return back to ER for refractory herpes zoster lesion to the center of her back radiating to the left anterior chest wall.  Patient is currently taking Valtrex.  Patient states new lesions anterior left chest wall.  Advised patient to continue previous medication.  Will add lidocaine patches, Atarax, and Percocet.  Patient advised to follow-up PCP or return to ED if condition worsens.     ____________________________________________   FINAL CLINICAL IMPRESSION(S) / ED DIAGNOSES  Final diagnoses:  Herpes zoster dermatitis     ED Discharge Orders         Ordered    lidocaine (LIDODERM) 5 %  Every 12 hours     05/02/19 1239    hydrOXYzine (ATARAX/VISTARIL) 25 MG  tablet  3 times daily PRN     05/02/19 1239    oxyCODONE-acetaminophen (PERCOCET) 5-325 MG tablet  Every 6 hours PRN     05/02/19 1239           Note:  This document was prepared using Dragon voice recognition software and may include unintentional dictation errors.    Joni ReiningSmith, Lessie Manigo K, PA-C 05/02/19 1249    Emily FilbertWilliams, Jonathan E, MD 05/02/19 1318

## 2019-05-02 NOTE — Discharge Instructions (Signed)
Continue Valtrex.  Start Percocet , Lidoderm patches, and Atarax as directed.

## 2019-05-02 NOTE — ED Triage Notes (Signed)
Pt states has shingles on her left back and side, was seen for them on 7/24 but not any better.

## 2019-08-05 ENCOUNTER — Telehealth: Payer: Self-pay

## 2019-08-05 NOTE — Telephone Encounter (Signed)
Pre-visit screening call prior to [place] visit on 08/07/2019.

## 2019-08-07 ENCOUNTER — Ambulatory Visit: Payer: Self-pay | Attending: Oncology | Admitting: *Deleted

## 2019-08-07 ENCOUNTER — Other Ambulatory Visit: Payer: Self-pay

## 2019-08-07 ENCOUNTER — Ambulatory Visit
Admission: RE | Admit: 2019-08-07 | Discharge: 2019-08-07 | Disposition: A | Discharge status: Home / Self Care | Payer: Self-pay | Source: Ambulatory Visit | Attending: Oncology | Admitting: Oncology

## 2019-08-07 ENCOUNTER — Encounter: Payer: Self-pay | Admitting: *Deleted

## 2019-08-07 VITALS — BP 100/64 | HR 76 | Temp 99.0°F | Resp 16 | Ht 65.0 in | Wt 177.9 lb

## 2019-08-07 DIAGNOSIS — Z0000 Encounter for general adult medical examination without abnormal findings: Secondary | ICD-10-CM | POA: Insufficient documentation

## 2019-08-07 DIAGNOSIS — Z20822 Contact with and (suspected) exposure to covid-19: Secondary | ICD-10-CM

## 2019-08-07 IMAGING — MG DIGITAL SCREENING BILAT W/ TOMO W/ CAD
8 series · 8 of 24 positions shown · non-contrast
Comparison: Previous exam(s).

CLINICAL DATA: Screening.

EXAM:
DIGITAL SCREENING BILATERAL MAMMOGRAM WITH TOMO AND CAD

[R CC synth-2D]
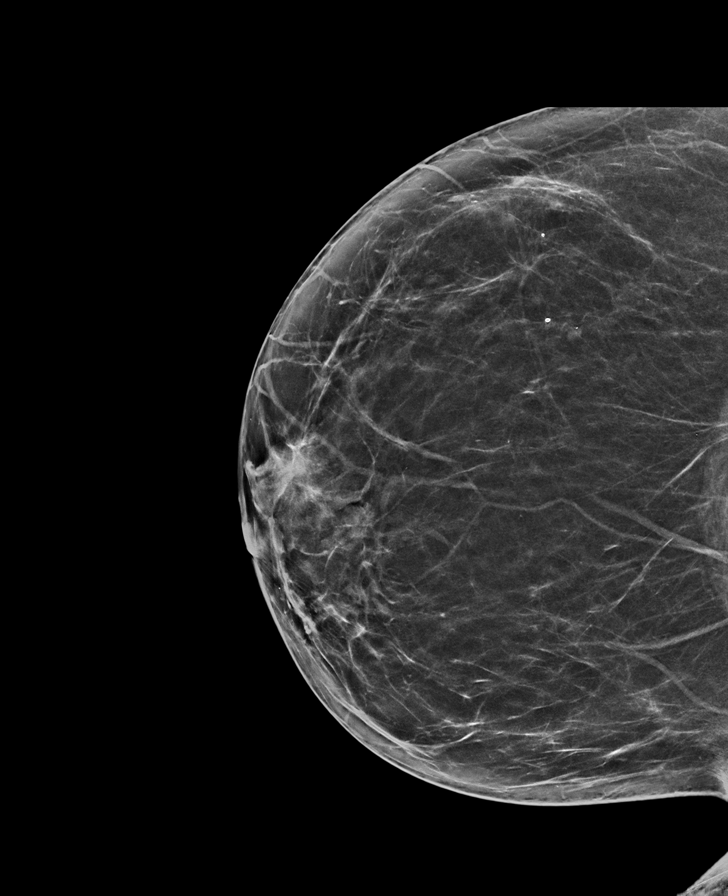

[R MLO synth-2D]
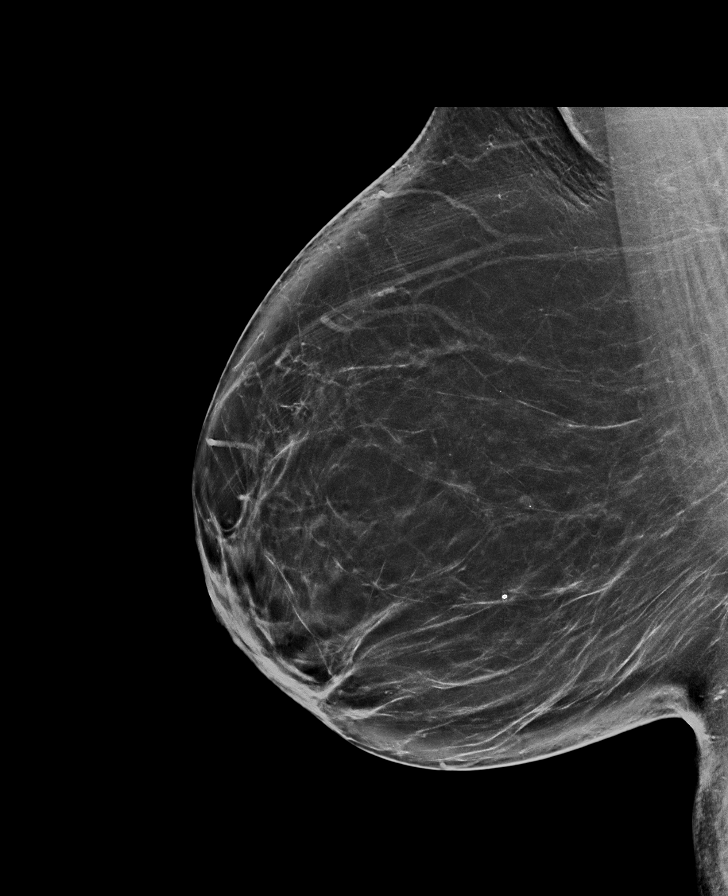

[L CC synth-2D]
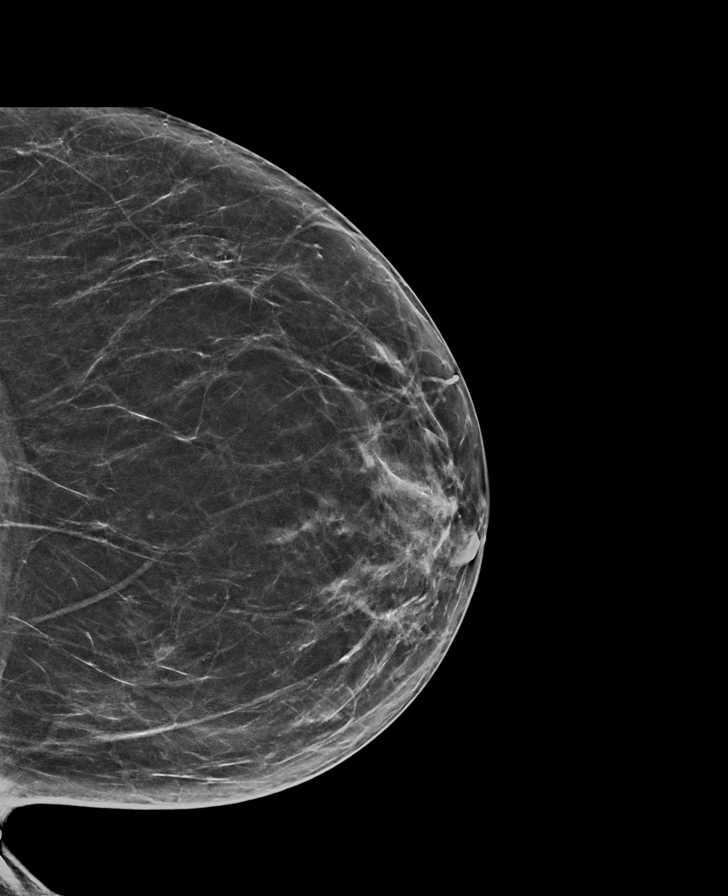

[L MLO synth-2D]
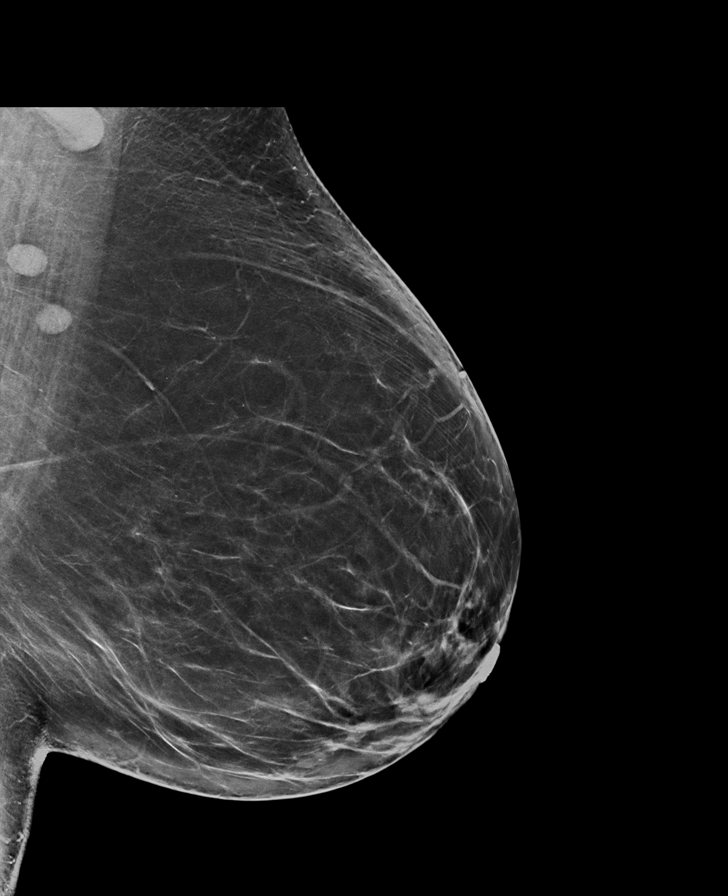

[L CC tomo · tomo slice 35/68.0]
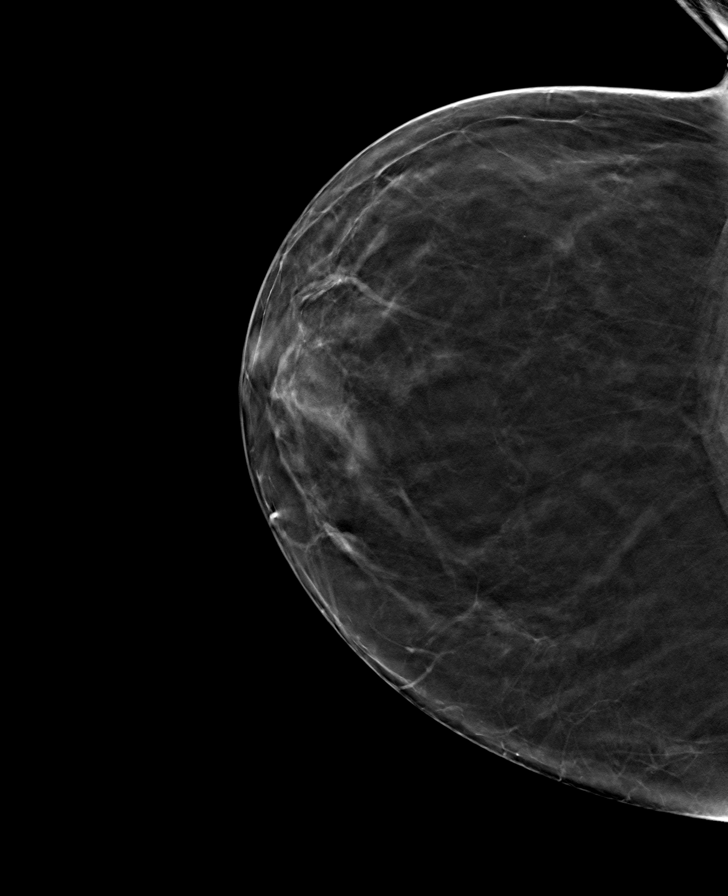

[R MLO tomo · tomo slice 43/86.0]
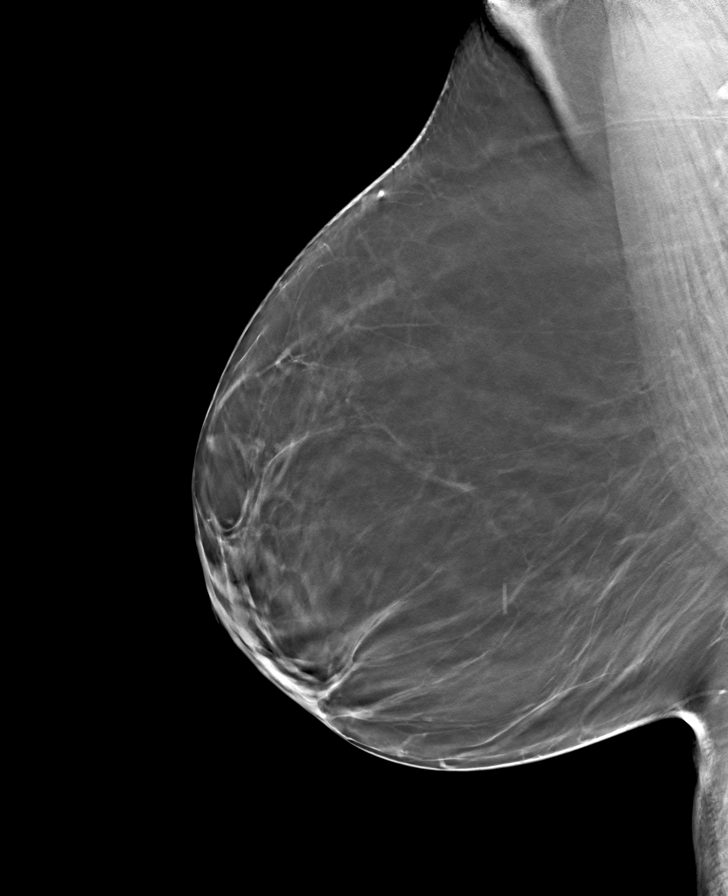

[L MLO tomo · tomo slice 43/86.0]
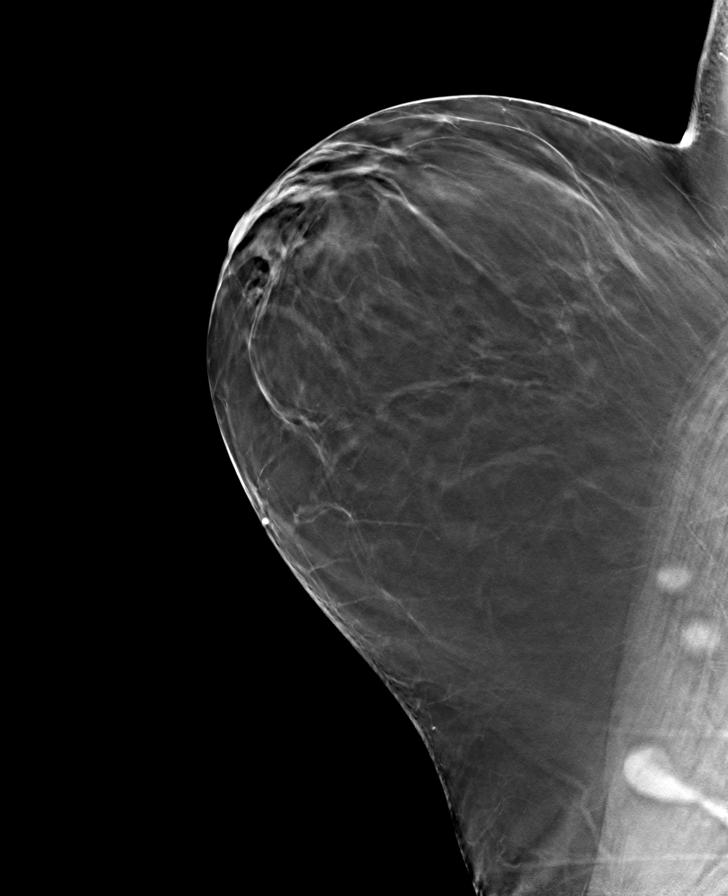

[R CC tomo · tomo slice 35/69.0]
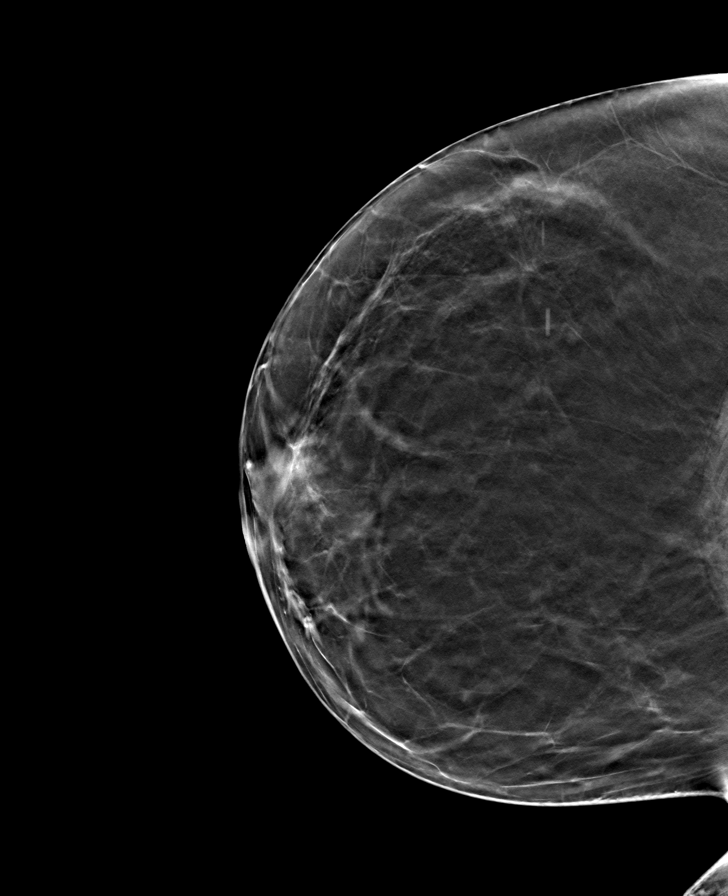

[8 of 24 positions shown; findings below may reference images not displayed]

ACR Breast Density Category b: There are scattered areas of
fibroglandular density.
FINDINGS: In the left axilla, a possible mass warrants further evaluation. In
the right breast, no findings suspicious for malignancy.

Images were processed with CAD.
IMPRESSION: Further evaluation is suggested for possible mass in the left
axilla.

RECOMMENDATION:
Ultrasound of the left axilla. (Code:[VP])

The patient will be contacted regarding the findings, and additional
imaging will be scheduled.

BI-RADS CATEGORY  0: Incomplete. Need additional imaging evaluation
and/or prior mammograms for comparison.

## 2019-08-07 NOTE — Progress Notes (Signed)
  Subjective:     Patient ID: Lindsey [name], female   DOB: [birth year], 63 y.o.   MRN: [id number]  HPI   Review of Systems     Objective:   Physical Exam Chest:     Breasts:        Right: No swelling, bleeding, inverted nipple, mass, nipple discharge, skin change or tenderness.        Left: Skin change present. No swelling, bleeding, inverted nipple, mass, nipple discharge or tenderness.    Lymphadenopathy:     Upper Body:     Right upper body: No supraclavicular or axillary adenopathy.     Left upper body: No supraclavicular or axillary adenopathy.        Assessment:     63 year old Black female returns to [facility] for clinical breast exam and mammogram.  Patient states she had shingles starting in July and still has a resolving rash and pain.  States she is still using a "pain patch" and Gabapentin for the pain.  On clinical breast exam I can visualize a dark spotted rash from the left medial back to the front medial chest.  The rash covers the lower portion of the breast.  There is slight indention at 5:00 in the left breast in the rash area that is very thick.  There is no dominant mass, nipple discharge or lymphadenopathy.  Taught self breast awareness.  Last pap on 10/28/15 was negative / negative.  Next pap due in 2022.  Patient has been screened for eligibility.  She does not have any insurance, Medicare or Medicaid.  She also meets financial eligibility.  Hand-out given on the Affordable Care Act. Risk Assessment    Risk Scores      08/07/2019 08/05/2019   Last edited by: Theodore Demark, RN [place], Laddie Aquas, CMA   5-year risk: 1.6 % 1.6 %   Lifetime risk: 6.3 % 6.3 %            Plan:     Screening mammogram ordered.  Will follow per [facility] protocol.

## 2019-08-07 NOTE — Patient Instructions (Signed)
Gave patient hand-out, Women Staying Healthy, Active and Well from [facility], with education on breast health, pap smears, heart and colon health. 

## 2019-08-08 ENCOUNTER — Other Ambulatory Visit: Payer: Self-pay | Admitting: *Deleted

## 2019-08-08 DIAGNOSIS — N63 Unspecified lump in unspecified breast: Secondary | ICD-10-CM

## 2019-08-08 LAB — NOVEL CORONAVIRUS, NAA: SARS-CoV-2, NAA: NOT DETECTED

## 2019-08-12 ENCOUNTER — Telehealth: Payer: Self-pay | Admitting: Family Medicine

## 2019-08-12 NOTE — Telephone Encounter (Signed)
Negative COVID results given. Patient results "NOT Detected." Caller expressed understanding. ° °

## 2019-08-23 ENCOUNTER — Ambulatory Visit
Admission: RE | Admit: 2019-08-23 | Discharge: 2019-08-23 | Disposition: A | Discharge status: Home / Self Care | Payer: Self-pay | Source: Ambulatory Visit | Attending: Oncology | Admitting: Oncology

## 2019-08-23 DIAGNOSIS — N63 Unspecified lump in unspecified breast: Secondary | ICD-10-CM

## 2019-08-23 IMAGING — US US BREAST*L* LIMITED INC AXILLA
1 series · 9 of 9 positions shown · non-contrast
Comparison: Previous exam(s).

CLINICAL DATA: Patient was called back from screening mammogram for
left axillary adenopathy. Patient says she had a severe case of
shingles on the left chest and back in [DATE] and has residual
pain in this area.

EXAM:
ULTRASOUND OF THE LEFT BREAST

[Series 1: us breast*left* limited inc axilla · 0.09mm/px · 9 of 9 slices shown]
[im 1/9]
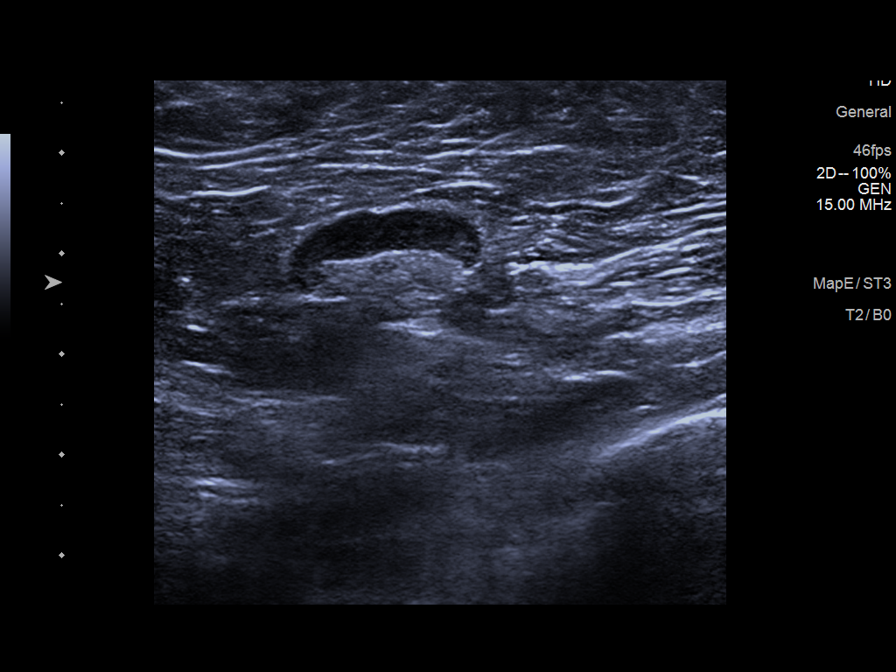
[im 2/9]
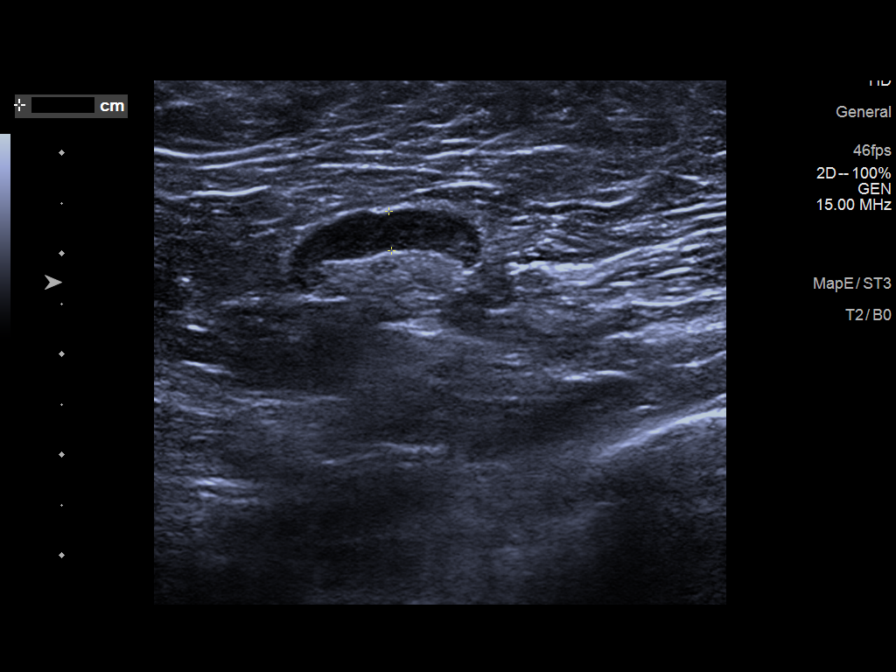
[im 3/9]
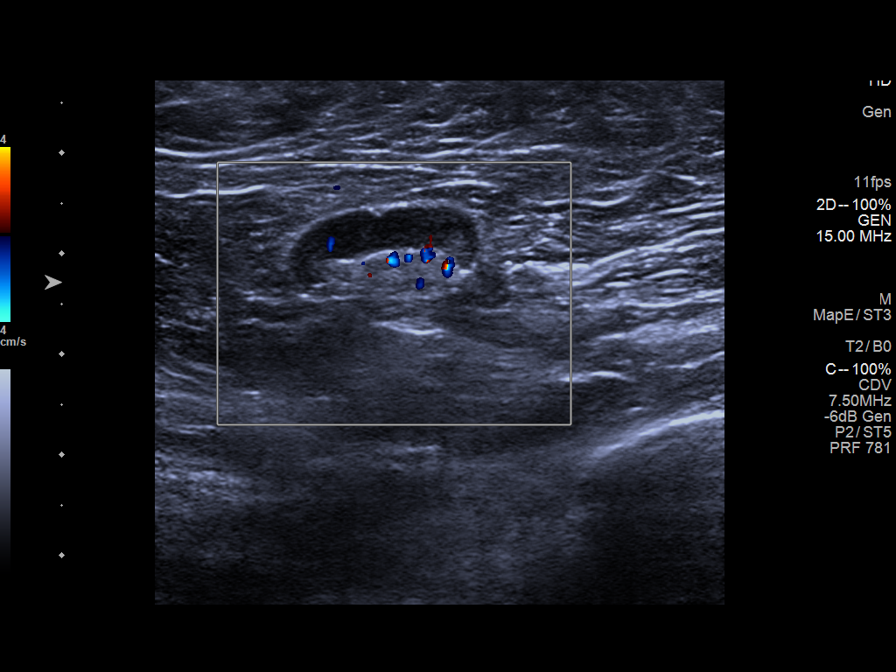
[im 4/9]
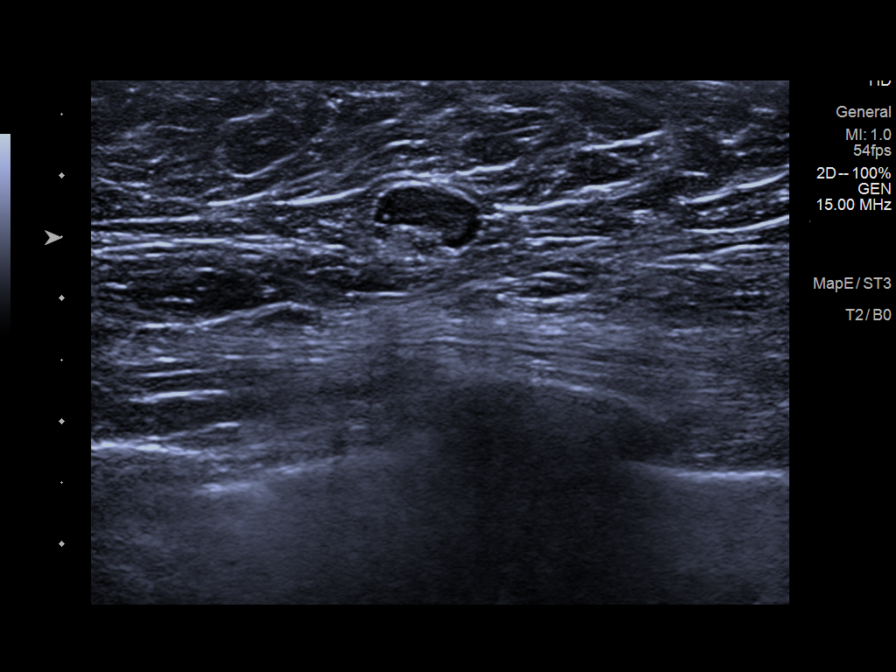
[im 5/9]
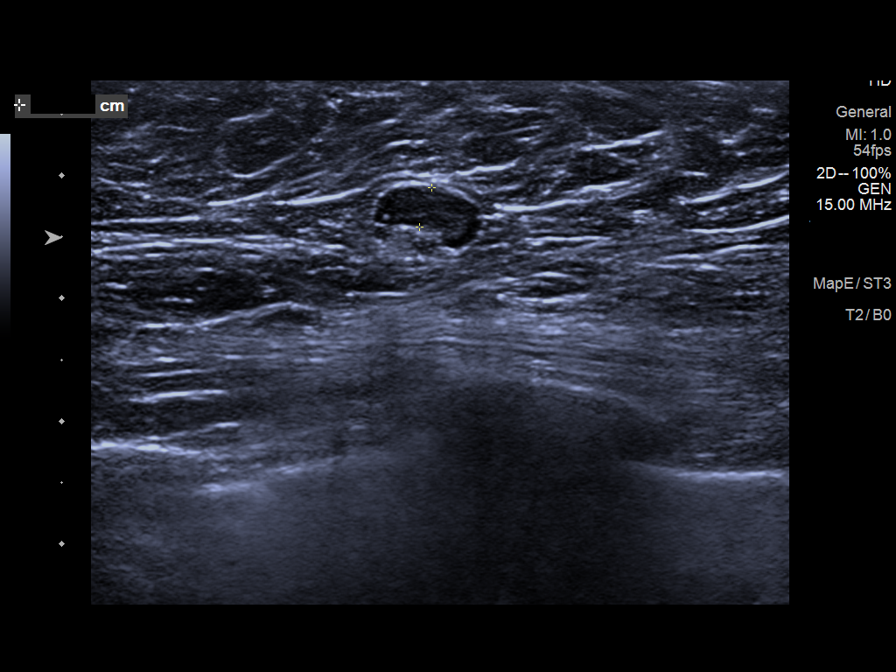
[im 6/9]
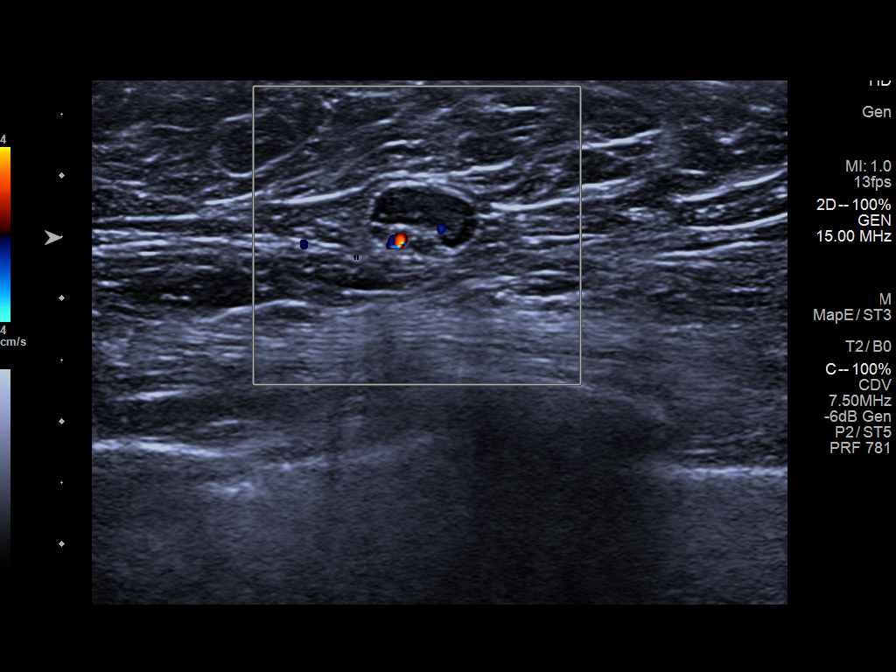
[im 7/9]
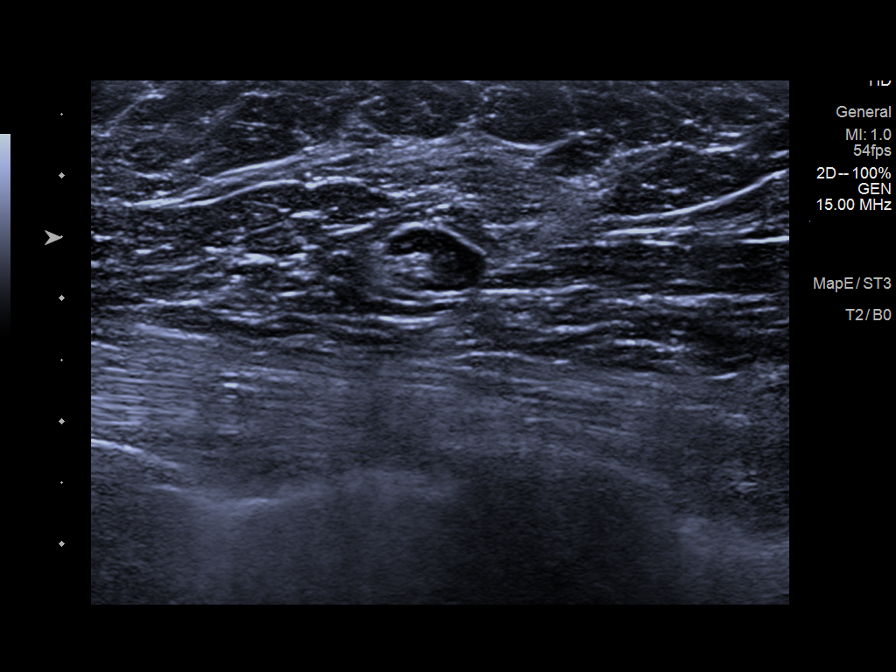
[im 8/9]
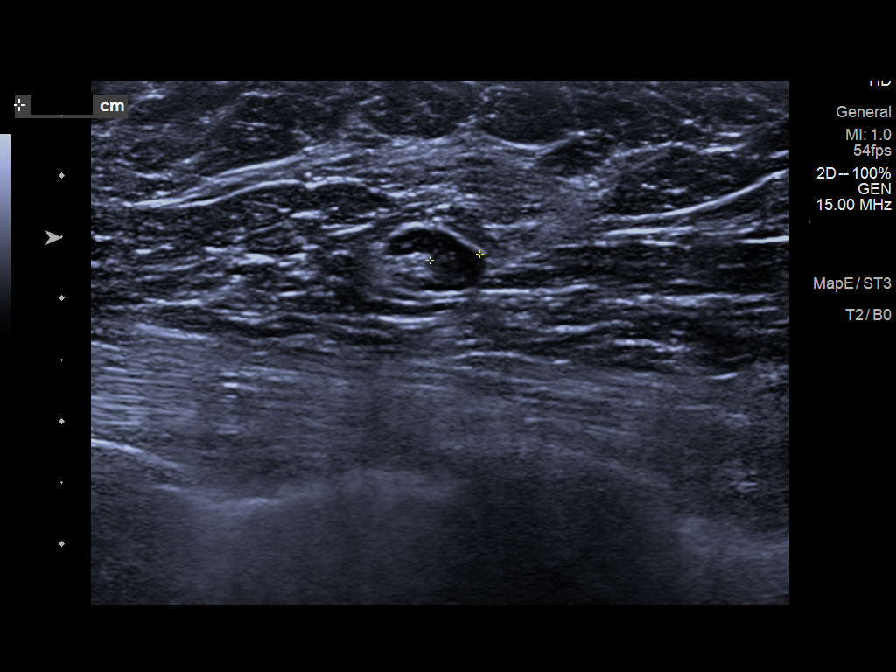
[im 9/9]
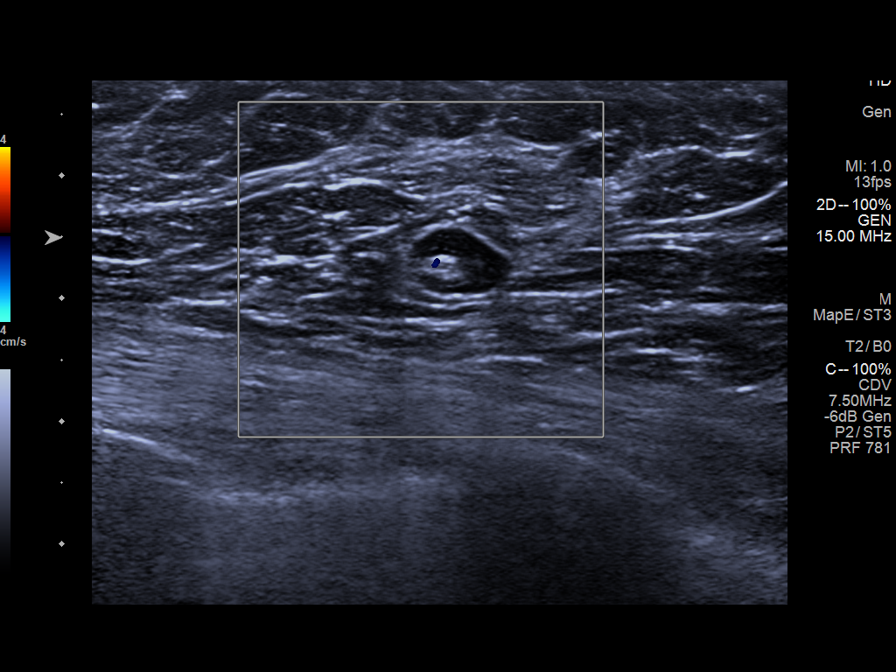

[9 of 9 positions shown; findings below may reference images not displayed]

FINDINGS: Targeted ultrasound is performed, showing 3 borderline left axillary
lymph nodes with cortical thickness measuring 4, 3 and 3 mm.
IMPRESSION: Probable benign left axillary lymph nodes.

RECOMMENDATION:
Short-term interval follow-up left breast ultrasound in 3 months is
recommended.

I have discussed the findings and recommendations with the patient.
If applicable, a reminder letter will be sent to the patient
regarding the next appointment.

BI-RADS CATEGORY  Choose

## 2019-08-28 ENCOUNTER — Encounter: Payer: Self-pay | Admitting: *Deleted

## 2019-08-28 ENCOUNTER — Other Ambulatory Visit: Payer: Self-pay | Admitting: *Deleted

## 2019-08-28 DIAGNOSIS — N63 Unspecified lump in unspecified breast: Secondary | ICD-10-CM

## 2019-08-28 NOTE — Progress Notes (Signed)
Letter mailed to inform patient of her next ultrasound in 3 months on 11/25/19 @ 10:40.  [facility] to [place].

## 2019-09-23 ENCOUNTER — Ambulatory Visit: Payer: Self-pay | Attending: Internal Medicine

## 2019-09-23 DIAGNOSIS — Z20822 Contact with and (suspected) exposure to covid-19: Secondary | ICD-10-CM

## 2019-09-25 LAB — NOVEL CORONAVIRUS, NAA: SARS-CoV-2, NAA: NOT DETECTED

## 2019-10-02 ENCOUNTER — Encounter: Payer: Self-pay | Admitting: *Deleted

## 2019-11-25 ENCOUNTER — Ambulatory Visit
Admission: RE | Admit: 2019-11-25 | Discharge: 2019-11-25 | Disposition: A | Discharge status: Home / Self Care | Payer: Self-pay | Source: Ambulatory Visit | Attending: Oncology | Admitting: Oncology

## 2019-11-25 DIAGNOSIS — N63 Unspecified lump in unspecified breast: Secondary | ICD-10-CM | POA: Insufficient documentation

## 2019-11-25 IMAGING — US US BREAST*L* LIMITED INC AXILLA
1 series · 9 of 9 positions shown · non-contrast
Comparison: [DATE]

CLINICAL DATA: Patient presents for a short interval follow-up left
axillary ultrasound to evaluate 3 borderline abnormal lymph nodes.

EXAM:
ULTRASOUND OF THE LEFT AXILLA

[Series 1: us breast*left* limited inc axilla · 0.09mm/px · 9 of 9 slices shown]
[im 1/9]
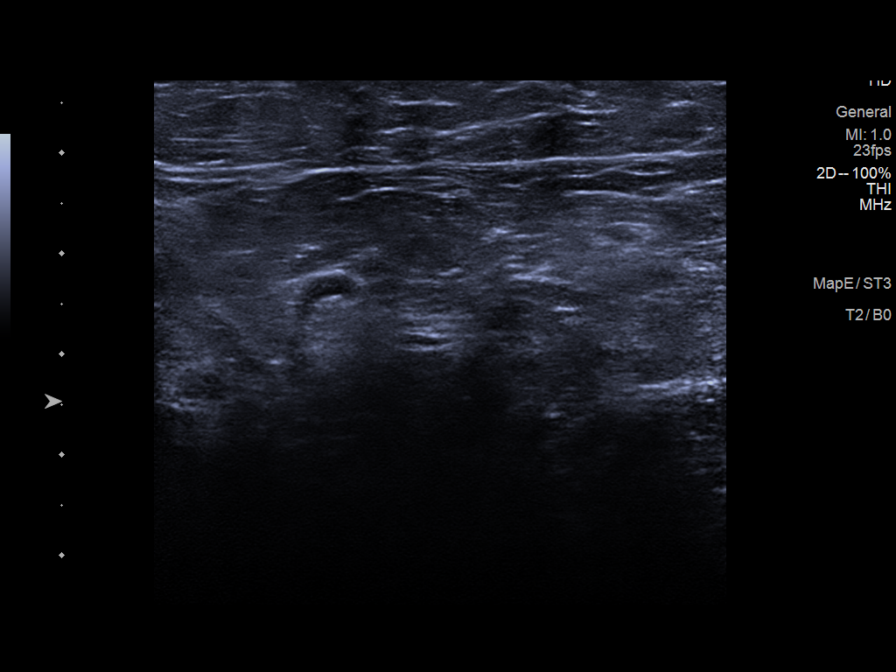
[im 2/9]
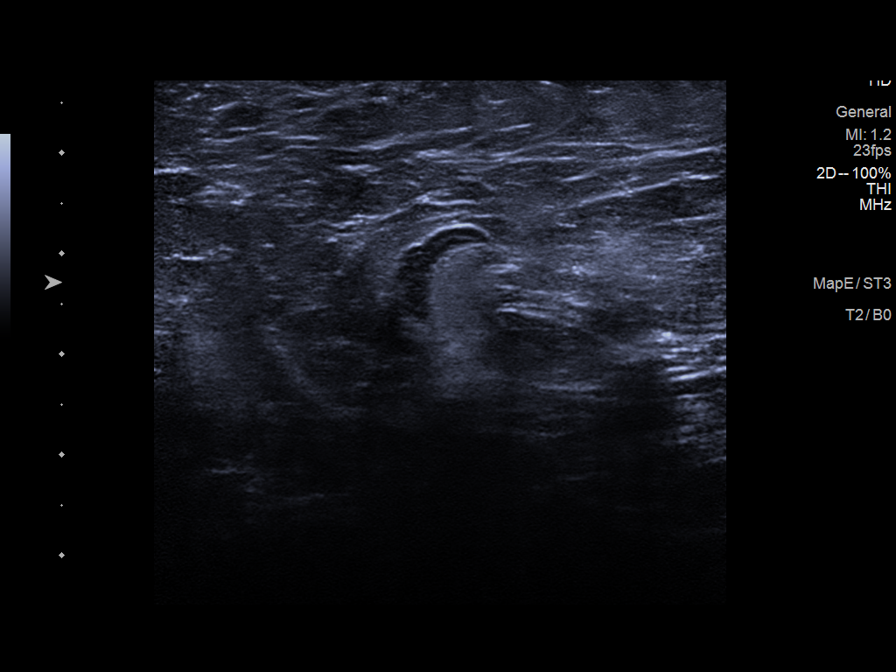
[im 3/9]
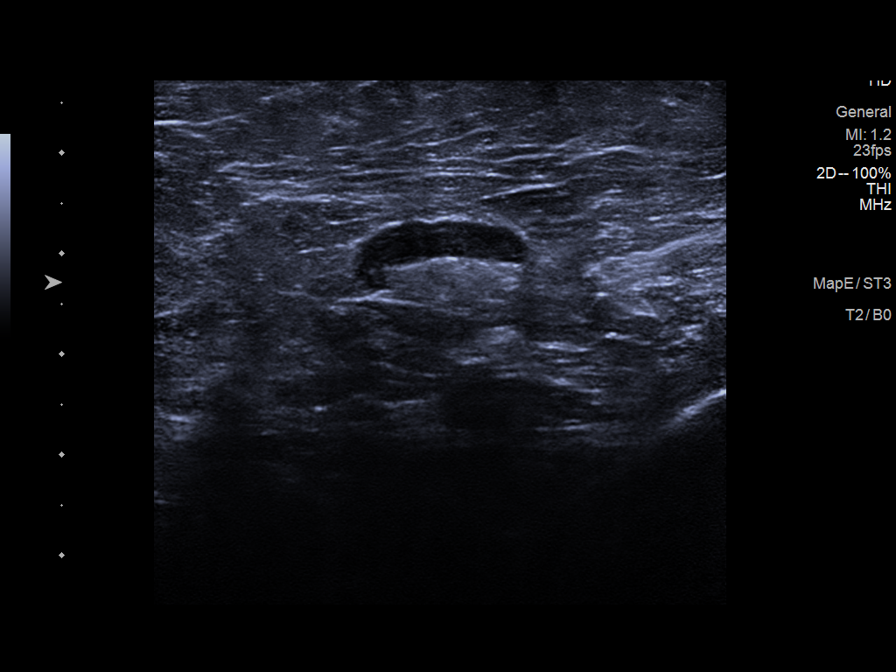
[im 4/9]
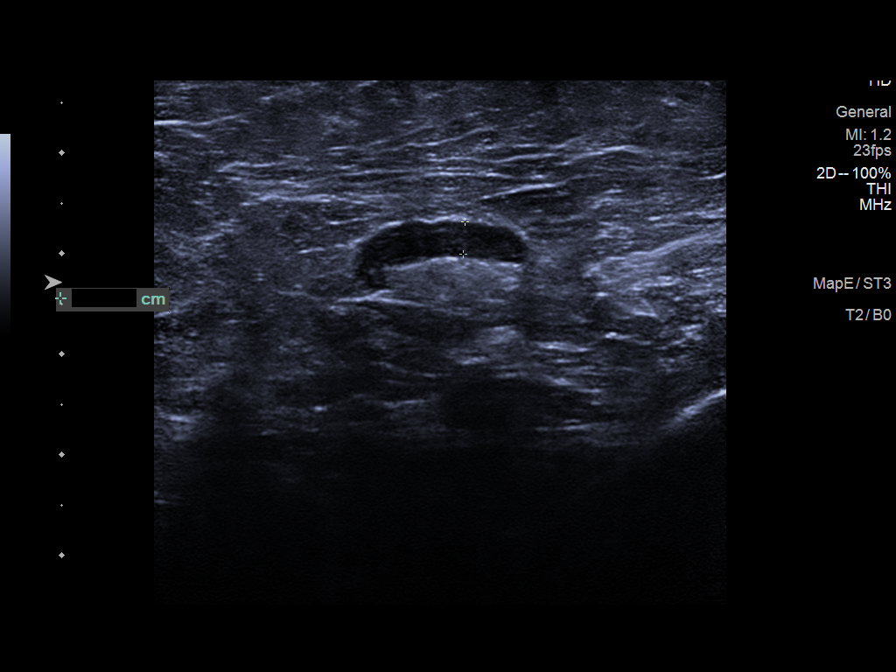
[im 5/9]
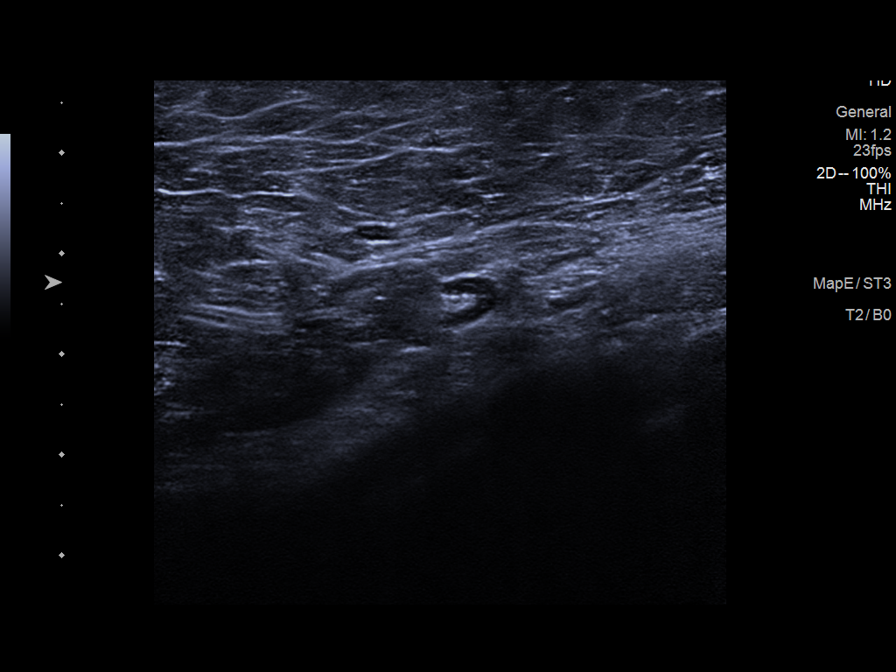
[im 6/9]
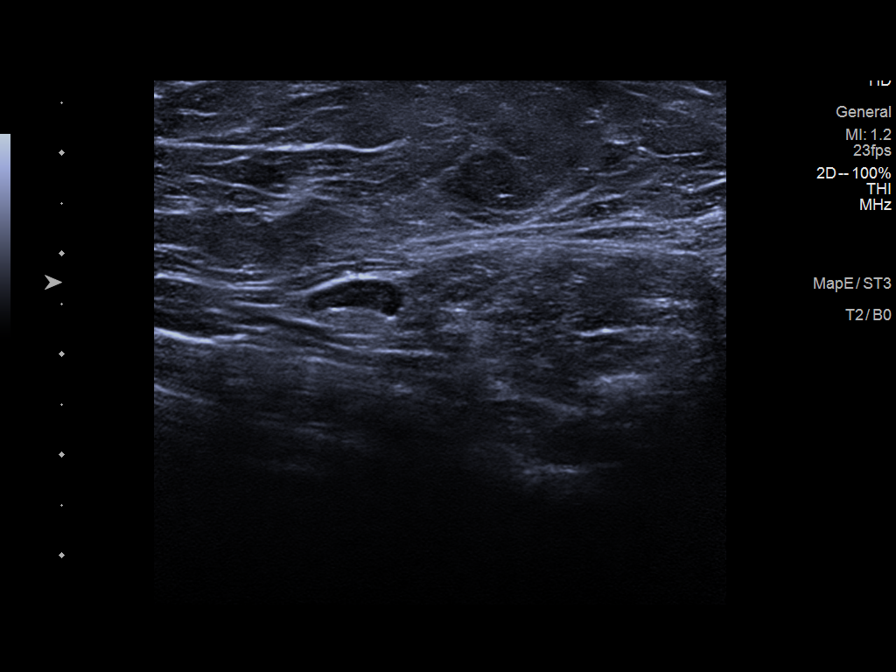
[im 7/9]
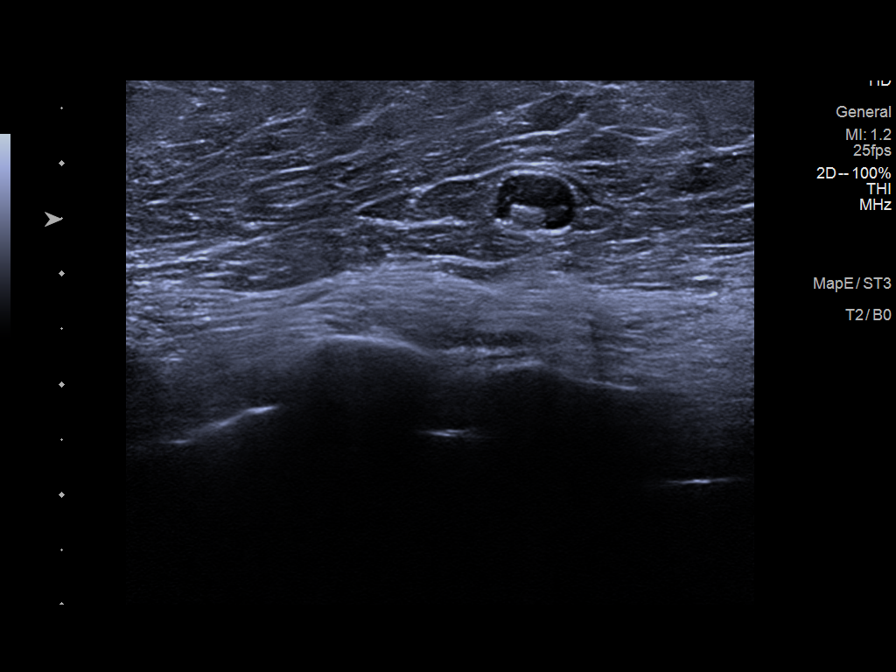
[im 8/9]
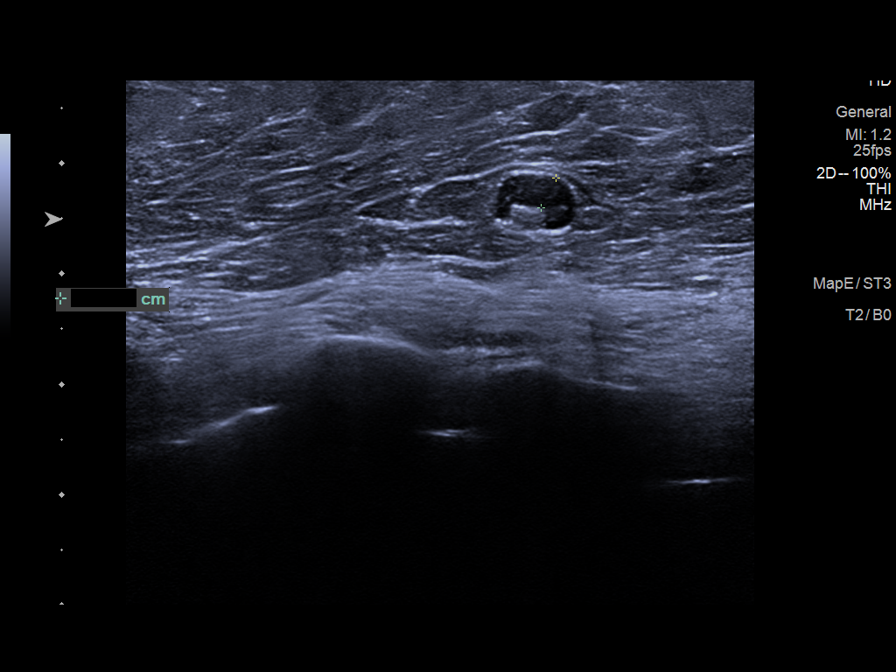
[im 9/9]
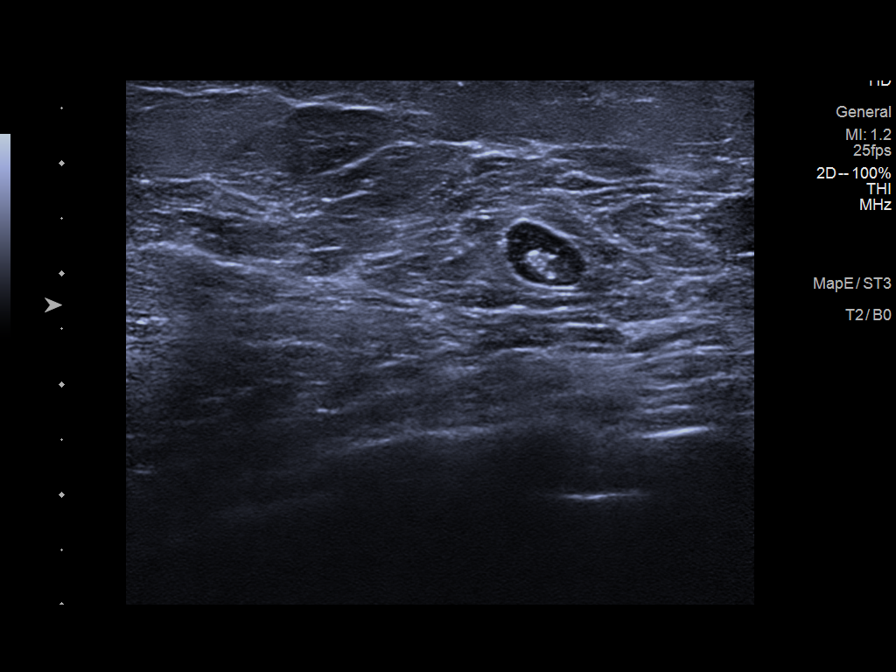

[9 of 9 positions shown; findings below may reference images not displayed]

FINDINGS: Ultrasound is performed, showing continued evidence of the 3
previously seen borderline abnormal lymph nodes over the left
axilla. The lymph node that had cortex of 3.9 mm previously now
measures 3.2 mm in thickness. The lymph node that previously had
cortical thickness of 3.4 mm now measures 3 mm. The third previously
seen borderline abnormal lymph node with cortical thickness of
mm is not well seen on today's exam. Remaining visualized left
axillary lymph nodes are normal.
IMPRESSION: Two stable borderline left axillary lymph nodes and resolution of
the third previously seen borderline abnormal left axillary lymph
node. These are likely reactive.

RECOMMENDATION:
Recommend an additional follow-up diagnostic left axillary
ultrasound at the time patient's annual bilateral mammogram in
[DATE] to document stability.

I have discussed the findings and recommendations with the patient.
If applicable, a reminder letter will be sent to the patient
regarding the next appointment.

BI-RADS CATEGORY  3: Probably benign.

## 2019-11-26 ENCOUNTER — Encounter: Payer: Self-pay | Admitting: *Deleted

## 2019-11-26 ENCOUNTER — Other Ambulatory Visit: Payer: Self-pay | Admitting: *Deleted

## 2019-11-26 DIAGNOSIS — N63 Unspecified lump in unspecified breast: Secondary | ICD-10-CM

## 2019-11-26 NOTE — Progress Notes (Signed)
Letter mailed to inform patient of her next [facility] and mammogram appointment on 08/26/20 @ 8:00.

## 2020-04-21 ENCOUNTER — Emergency Department (HOSPITAL_COMMUNITY): Payer: Medicaid Other

## 2020-04-21 ENCOUNTER — Encounter (HOSPITAL_COMMUNITY): Payer: Self-pay

## 2020-04-21 ENCOUNTER — Inpatient Hospital Stay (HOSPITAL_COMMUNITY)
Admission: EM | Admit: 2020-04-21 | Discharge: 2020-04-30 | DRG: 064 | Disposition: A | Discharge status: Rehabilitation Facility | Payer: Medicaid Other | Attending: Internal Medicine | Admitting: Internal Medicine

## 2020-04-21 ENCOUNTER — Inpatient Hospital Stay (HOSPITAL_COMMUNITY): Payer: Medicaid Other

## 2020-04-21 DIAGNOSIS — I619 Nontraumatic intracerebral hemorrhage, unspecified: Secondary | ICD-10-CM | POA: Diagnosis present

## 2020-04-21 DIAGNOSIS — I69320 Aphasia following cerebral infarction: Secondary | ICD-10-CM | POA: Diagnosis not present

## 2020-04-21 DIAGNOSIS — I69391 Dysphagia following cerebral infarction: Secondary | ICD-10-CM | POA: Diagnosis not present

## 2020-04-21 DIAGNOSIS — R000 Tachycardia, unspecified: Secondary | ICD-10-CM | POA: Diagnosis not present

## 2020-04-21 DIAGNOSIS — Z87891 Personal history of nicotine dependence: Secondary | ICD-10-CM | POA: Diagnosis not present

## 2020-04-21 DIAGNOSIS — R29711 NIHSS score 11: Secondary | ICD-10-CM | POA: Diagnosis present

## 2020-04-21 DIAGNOSIS — G936 Cerebral edema: Secondary | ICD-10-CM | POA: Diagnosis present

## 2020-04-21 DIAGNOSIS — I499 Cardiac arrhythmia, unspecified: Secondary | ICD-10-CM | POA: Diagnosis not present

## 2020-04-21 DIAGNOSIS — I639 Cerebral infarction, unspecified: Secondary | ICD-10-CM | POA: Diagnosis not present

## 2020-04-21 DIAGNOSIS — R7303 Prediabetes: Secondary | ICD-10-CM | POA: Diagnosis present

## 2020-04-21 DIAGNOSIS — Z791 Long term (current) use of non-steroidal anti-inflammatories (NSAID): Secondary | ICD-10-CM

## 2020-04-21 DIAGNOSIS — I69312 Visuospatial deficit and spatial neglect following cerebral infarction: Secondary | ICD-10-CM | POA: Diagnosis not present

## 2020-04-21 DIAGNOSIS — E876 Hypokalemia: Secondary | ICD-10-CM | POA: Diagnosis present

## 2020-04-21 DIAGNOSIS — R001 Bradycardia, unspecified: Secondary | ICD-10-CM | POA: Diagnosis not present

## 2020-04-21 DIAGNOSIS — Z79899 Other long term (current) drug therapy: Secondary | ICD-10-CM | POA: Diagnosis not present

## 2020-04-21 DIAGNOSIS — Z20822 Contact with and (suspected) exposure to covid-19: Secondary | ICD-10-CM | POA: Diagnosis present

## 2020-04-21 DIAGNOSIS — R4701 Aphasia: Secondary | ICD-10-CM | POA: Diagnosis present

## 2020-04-21 DIAGNOSIS — I6340 Cerebral infarction due to embolism of unspecified cerebral artery: Secondary | ICD-10-CM | POA: Diagnosis not present

## 2020-04-21 DIAGNOSIS — I69392 Facial weakness following cerebral infarction: Secondary | ICD-10-CM | POA: Diagnosis not present

## 2020-04-21 DIAGNOSIS — I4891 Unspecified atrial fibrillation: Secondary | ICD-10-CM | POA: Diagnosis present

## 2020-04-21 DIAGNOSIS — R29714 NIHSS score 14: Secondary | ICD-10-CM | POA: Diagnosis not present

## 2020-04-21 DIAGNOSIS — I4819 Other persistent atrial fibrillation: Secondary | ICD-10-CM | POA: Diagnosis not present

## 2020-04-21 DIAGNOSIS — R29717 NIHSS score 17: Secondary | ICD-10-CM | POA: Diagnosis not present

## 2020-04-21 DIAGNOSIS — R471 Dysarthria and anarthria: Secondary | ICD-10-CM | POA: Diagnosis present

## 2020-04-21 DIAGNOSIS — E785 Hyperlipidemia, unspecified: Secondary | ICD-10-CM | POA: Diagnosis present

## 2020-04-21 DIAGNOSIS — I69322 Dysarthria following cerebral infarction: Secondary | ICD-10-CM | POA: Diagnosis not present

## 2020-04-21 DIAGNOSIS — I69351 Hemiplegia and hemiparesis following cerebral infarction affecting right dominant side: Secondary | ICD-10-CM | POA: Diagnosis not present

## 2020-04-21 DIAGNOSIS — R29716 NIHSS score 16: Secondary | ICD-10-CM | POA: Diagnosis not present

## 2020-04-21 DIAGNOSIS — I6389 Other cerebral infarction: Secondary | ICD-10-CM | POA: Diagnosis not present

## 2020-04-21 DIAGNOSIS — I10 Essential (primary) hypertension: Secondary | ICD-10-CM | POA: Diagnosis present

## 2020-04-21 DIAGNOSIS — R4781 Slurred speech: Secondary | ICD-10-CM | POA: Diagnosis not present

## 2020-04-21 DIAGNOSIS — R0902 Hypoxemia: Secondary | ICD-10-CM | POA: Diagnosis not present

## 2020-04-21 DIAGNOSIS — I4892 Unspecified atrial flutter: Secondary | ICD-10-CM | POA: Diagnosis present

## 2020-04-21 DIAGNOSIS — I63412 Cerebral infarction due to embolism of left middle cerebral artery: Secondary | ICD-10-CM | POA: Diagnosis present

## 2020-04-21 DIAGNOSIS — D62 Acute posthemorrhagic anemia: Secondary | ICD-10-CM | POA: Diagnosis not present

## 2020-04-21 DIAGNOSIS — G8191 Hemiplegia, unspecified affecting right dominant side: Secondary | ICD-10-CM | POA: Diagnosis not present

## 2020-04-21 DIAGNOSIS — R1310 Dysphagia, unspecified: Secondary | ICD-10-CM | POA: Diagnosis present

## 2020-04-21 DIAGNOSIS — F1010 Alcohol abuse, uncomplicated: Secondary | ICD-10-CM | POA: Diagnosis present

## 2020-04-21 DIAGNOSIS — G4700 Insomnia, unspecified: Secondary | ICD-10-CM | POA: Diagnosis not present

## 2020-04-21 DIAGNOSIS — I63512 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 middle cerebral artery: Secondary | ICD-10-CM | POA: Diagnosis not present

## 2020-04-21 DIAGNOSIS — R29810 Facial weakness: Secondary | ICD-10-CM | POA: Diagnosis not present

## 2020-04-21 HISTORY — DX: Diverticulosis of intestine, part unspecified, without perforation or abscess without bleeding: K57.90

## 2020-04-21 HISTORY — DX: Uterovaginal prolapse, unspecified: N81.4

## 2020-04-21 LAB — URINALYSIS, ROUTINE W REFLEX MICROSCOPIC
Bilirubin Urine: NEGATIVE
Glucose, UA: NEGATIVE mg/dL
Hgb urine dipstick: NEGATIVE
Ketones, ur: 20 mg/dL — AB
Leukocytes,Ua: NEGATIVE
Nitrite: NEGATIVE
Protein, ur: NEGATIVE mg/dL
Specific Gravity, Urine: 1.042 — ABNORMAL HIGH (ref 1.005–1.030)
pH: 6 (ref 5.0–8.0)

## 2020-04-21 LAB — CBC
HCT: 39 % (ref 36.0–46.0)
Hemoglobin: 13 g/dL (ref 12.0–15.0)
MCH: 29.6 pg (ref 26.0–34.0)
MCHC: 33.3 g/dL (ref 30.0–36.0)
MCV: 88.8 fL (ref 80.0–100.0)
Platelets: 305 10*3/uL (ref 150–400)
RBC: 4.39 MIL/uL (ref 3.87–5.11)
RDW: 14.8 % (ref 11.5–15.5)
WBC: 5.1 10*3/uL (ref 4.0–10.5)
nRBC: 0 % (ref 0.0–0.2)

## 2020-04-21 LAB — DIFFERENTIAL
Abs Immature Granulocytes: 0.01 10*3/uL (ref 0.00–0.07)
Basophils Absolute: 0 10*3/uL (ref 0.0–0.1)
Basophils Relative: 0 %
Eosinophils Absolute: 0 10*3/uL (ref 0.0–0.5)
Eosinophils Relative: 0 %
Immature Granulocytes: 0 %
Lymphocytes Relative: 17 %
Lymphs Abs: 0.9 10*3/uL (ref 0.7–4.0)
Monocytes Absolute: 0.5 10*3/uL (ref 0.1–1.0)
Monocytes Relative: 10 %
Neutro Abs: 3.7 10*3/uL (ref 1.7–7.7)
Neutrophils Relative %: 73 %

## 2020-04-21 LAB — RAPID URINE DRUG SCREEN, HOSP PERFORMED
Amphetamines: NOT DETECTED
Barbiturates: NOT DETECTED
Benzodiazepines: NOT DETECTED
Cocaine: NOT DETECTED
Opiates: NOT DETECTED
Tetrahydrocannabinol: POSITIVE — AB

## 2020-04-21 LAB — COMPREHENSIVE METABOLIC PANEL
ALT: 38 U/L (ref 0–44)
AST: 56 U/L — ABNORMAL HIGH (ref 15–41)
Albumin: 3.8 g/dL (ref 3.5–5.0)
Alkaline Phosphatase: 79 U/L (ref 38–126)
Anion gap: 16 — ABNORMAL HIGH (ref 5–15)
BUN: 7 mg/dL — ABNORMAL LOW (ref 8–23)
CO2: 19 mmol/L — ABNORMAL LOW (ref 22–32)
Calcium: 9.2 mg/dL (ref 8.9–10.3)
Chloride: 102 mmol/L (ref 98–111)
Creatinine, Ser: 0.7 mg/dL (ref 0.44–1.00)
GFR calc Af Amer: >60 mL/min (ref >60)
GFR calc non Af Amer: >60 mL/min (ref >60)
Glucose, Bld: 128 mg/dL — ABNORMAL HIGH (ref 70–99)
Potassium: 4 mmol/L (ref 3.5–5.1)
Sodium: 137 mmol/L (ref 135–145)
Total Bilirubin: 1.4 mg/dL — ABNORMAL HIGH (ref 0.3–1.2)
Total Protein: 8.4 g/dL — ABNORMAL HIGH (ref 6.5–8.1)

## 2020-04-21 LAB — ETHANOL: Alcohol, Ethyl (B): <10 mg/dL (ref <10)

## 2020-04-21 LAB — CBG MONITORING, ED: Glucose-Capillary: 133 mg/dL — ABNORMAL HIGH (ref 70–99)

## 2020-04-21 LAB — APTT: aPTT: 25 seconds (ref 24–36)

## 2020-04-21 LAB — PROTIME-INR
INR: 1.1 (ref 0.8–1.2)
Prothrombin Time: 14 seconds (ref 11.4–15.2)

## 2020-04-21 LAB — SARS CORONAVIRUS 2 BY RT PCR (HOSPITAL ORDER, PERFORMED IN ~~LOC~~ HOSPITAL LAB): SARS Coronavirus 2: NEGATIVE

## 2020-04-21 IMAGING — CT CT HEAD W/O CM
4 series · 16 of 47 positions shown, 18 images · non-contrast
Comparison: None.

CLINICAL DATA: Stroke suspected, slurred speech, right-sided
weakness and facial droop

EXAM:
CT HEAD WITHOUT CONTRAST
TECHNIQUE: Contiguous axial images were obtained from the base of the skull
through the vertex without intravenous contrast.

[Series 2: head wo · axial · 0.47mm/px · z∈[+1240,+1366]mm · 7 of 35 slices shown, 9 images]
[im 5/35  brain]
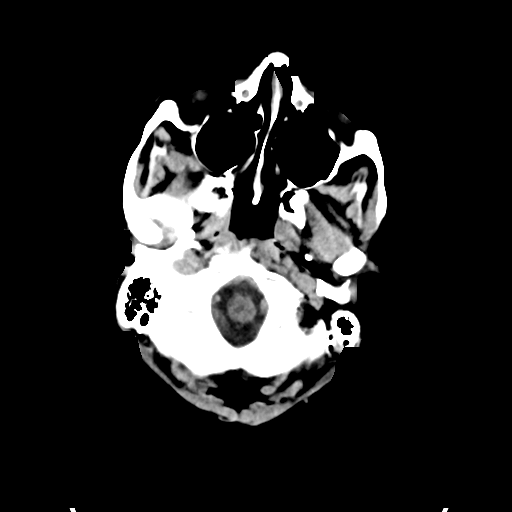
[im 5/35  bone]
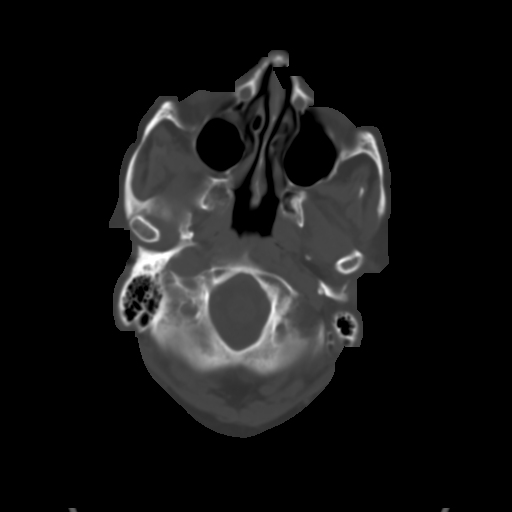
[im 9/35  brain]
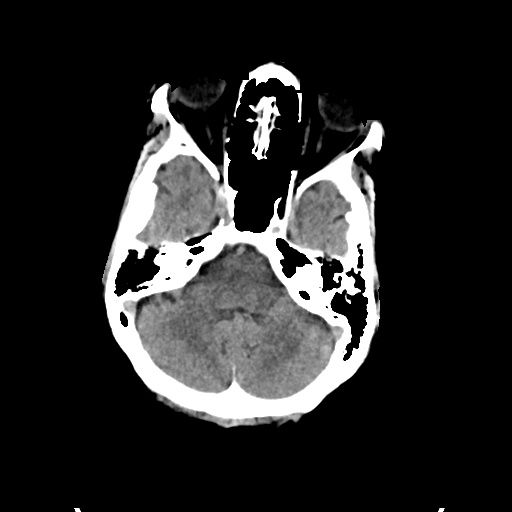
[im 13/35  brain]
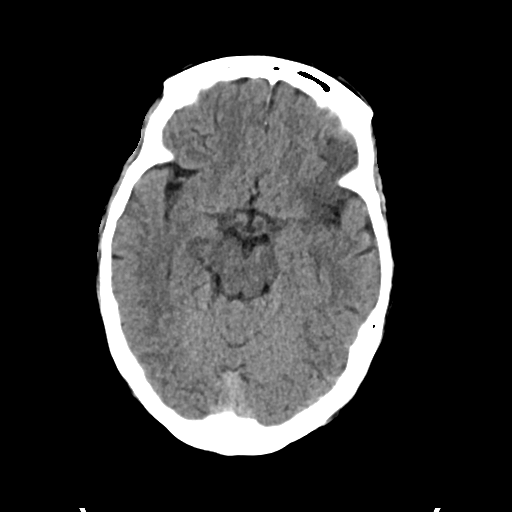
[im 18/35  brain]
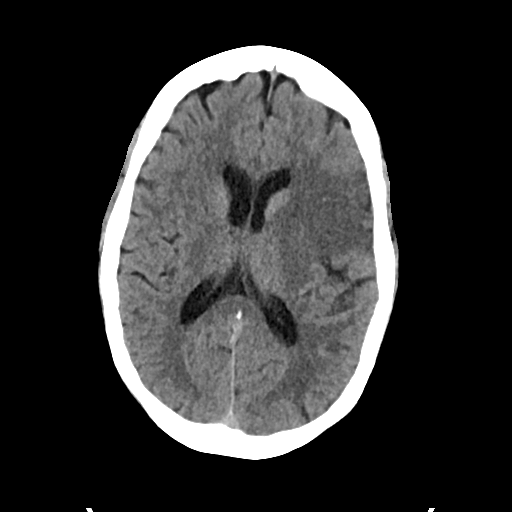
[im 22/35  brain]
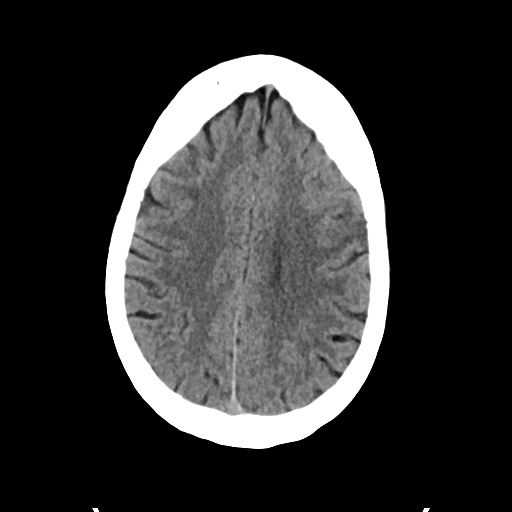
[im 22/35  bone]
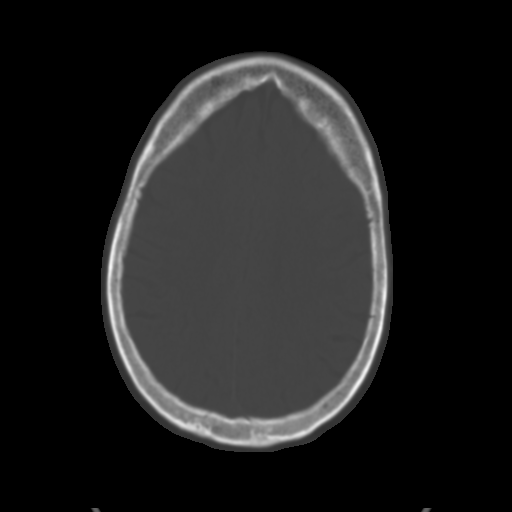
[im 26/35  brain]
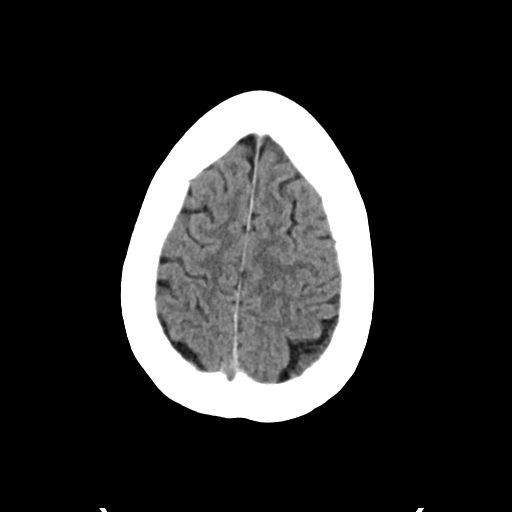
[im 30/35  brain]
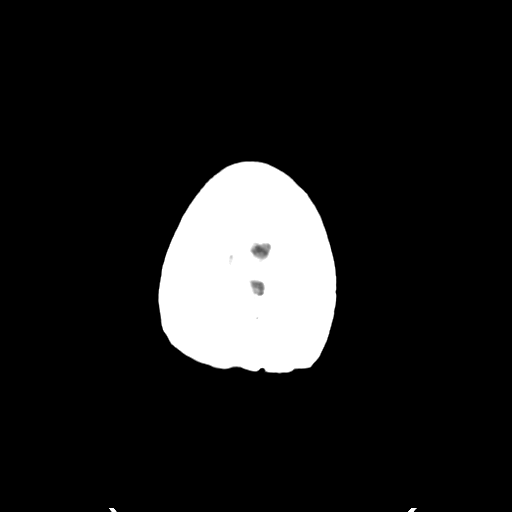

[Series 3: head bone · axial · 0.47mm/px · z∈[+1236,+1270]mm · 3 of 86 slices shown]
[im 9/86  bone]
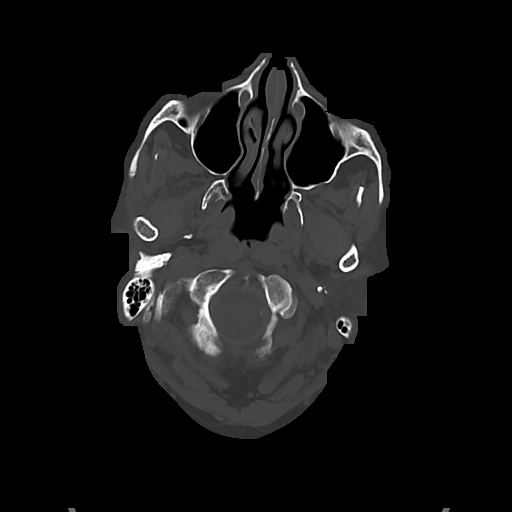
[im 18/86  bone]
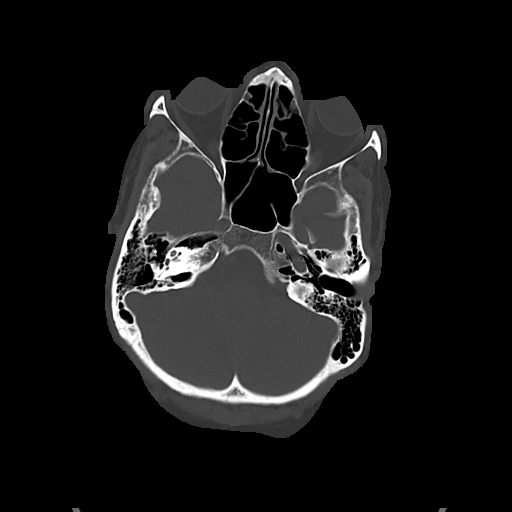
[im 26/86  bone]
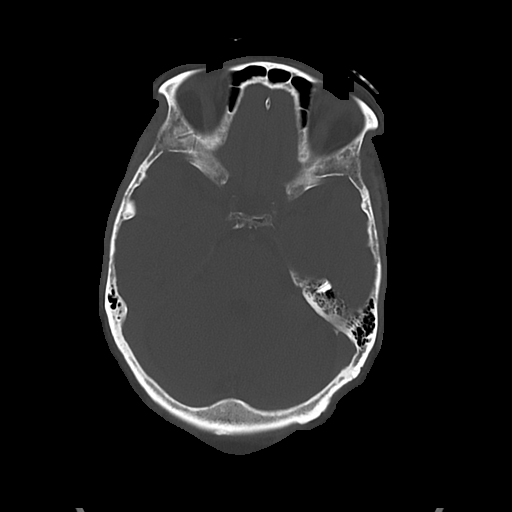

[Series 4: cor soft · coronal · 0.35mm/px · 3 of 73 slices shown]
[im 25/73  brain]
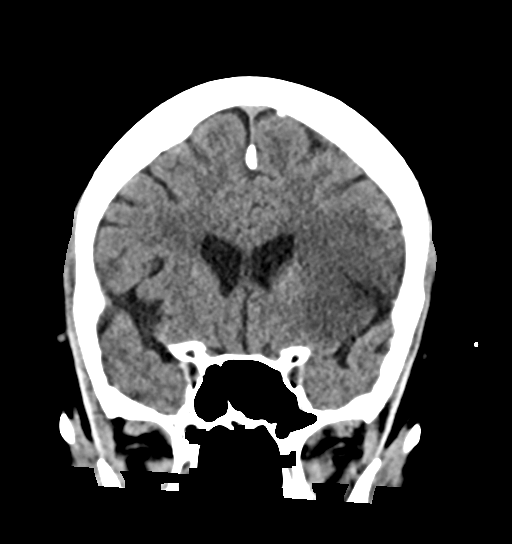
[im 33/73  brain]
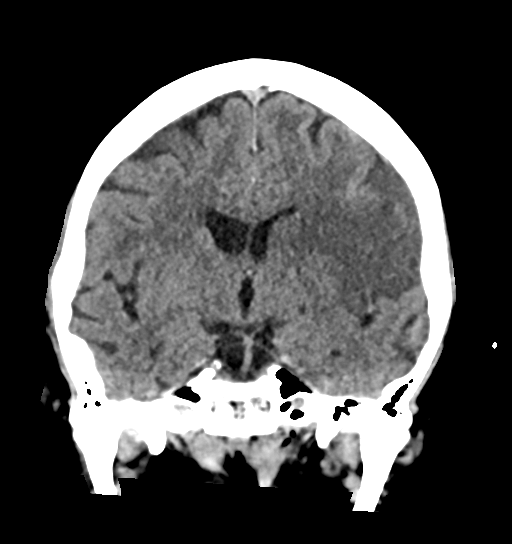
[im 41/73  brain]
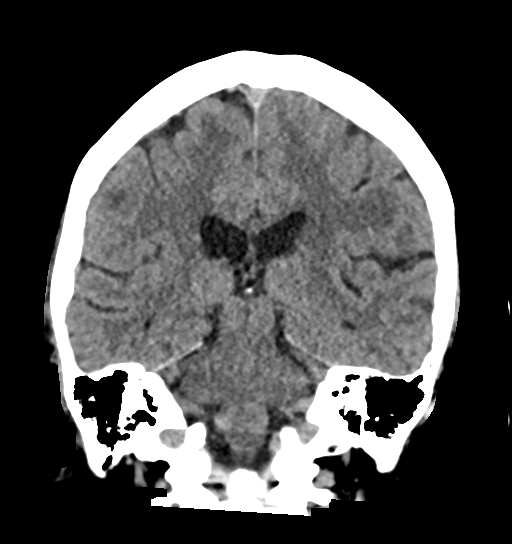

[Series 5: sag soft · sagittal · 0.35mm/px · 3 of 56 slices shown]
[im 19/56  brain]
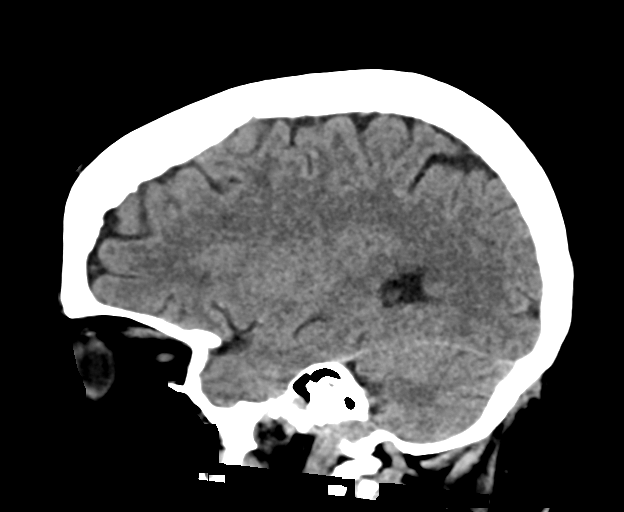
[im 28/56  brain]
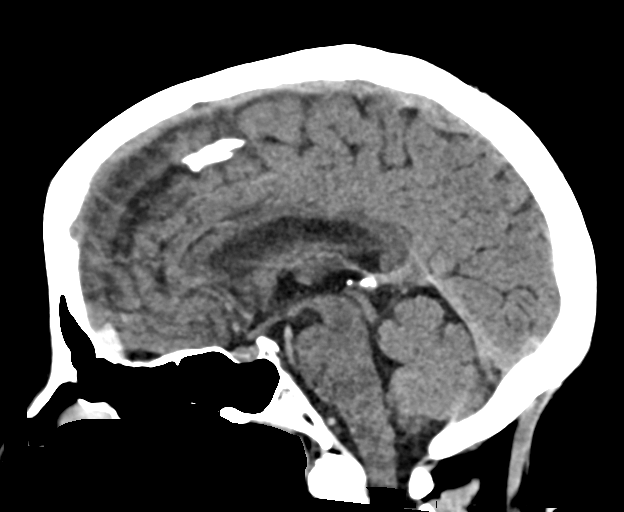
[im 37/56  brain]
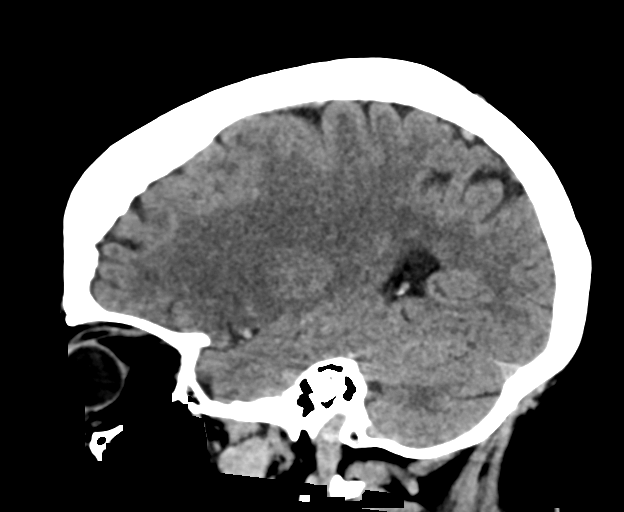

[16 of 47 positions shown; findings below may reference images not displayed]

FINDINGS: Brain: There is a hypodensity of the anterior left MCA territory
centered about the insula and frontal operculum, involving the left
basal ganglia, particularly the putamen and internal capsule, as
well as the high left MCA territory cortex. There is no involvement
of the temporal lobe. No evidence of hemorrhage. There is mild mass
effect on the left lateral ventricle with approximately 3 mm left
right midline shift.

Vascular: Subtly hyperdense M2 segmental vessel in the left insula
(series 2, image 12).

Skull: Normal. Negative for fracture or focal lesion.

Sinuses/Orbits: No acute finding.

Other: None.
IMPRESSION: 1. There is a large hypodensity of the anterior left MCA territory,
findings consistent with acute to subacute left MCA territory
infarction. ASPECTS 4.

2.  No evidence of hemorrhage.

3. There is mild mass effect on the left lateral ventricle with
approximately 3 mm left right midline shift.

Findings reported by telephone to Dr. DANII at the time of
interpretation, [DATE] p.m., [DATE]

## 2020-04-21 IMAGING — MR MR HEAD W/O CM
12 of 13 series · 44 of 48 positions shown · non-contrast
Comparison: CT angio head neck [DATE]

CLINICAL DATA: Stroke slurred speech

EXAM:
MRI HEAD WITHOUT CONTRAST
TECHNIQUE: Multiplanar, multiecho pulse sequences of the brain and surrounding
structures were obtained without intravenous contrast.

[Series 5: DWI · axial · 3.0mm · 0.88mm/px · z∈[-106,+47]mm · 8 of 104 slices shown (1 of 4)]
[im 1/104]
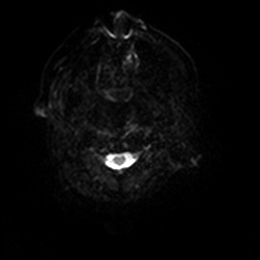
[im 15/104]
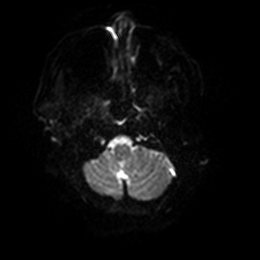
[im 30/104]
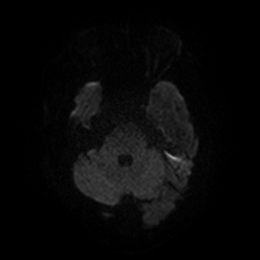
[im 45/104]
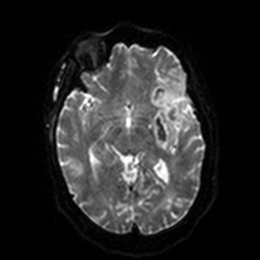
[im 59/104]
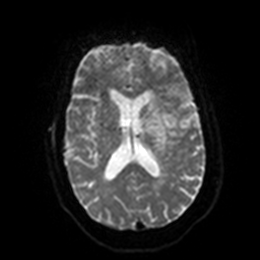
[im 74/104]
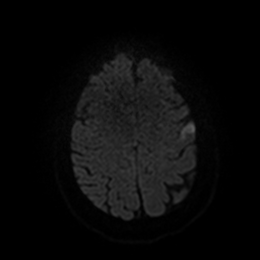
[im 89/104]
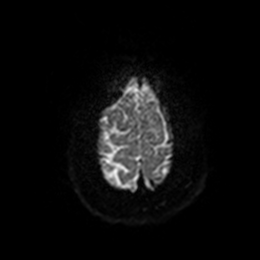
[im 104/104]
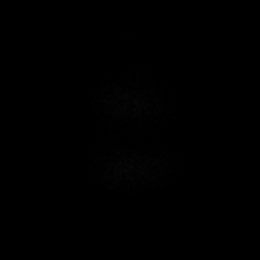

[Series 6: DWI · axial · 3.0mm · 0.88mm/px · z∈[-106,+44]mm · 4 of 51 slices shown (2 of 4)]
[im 1/51]
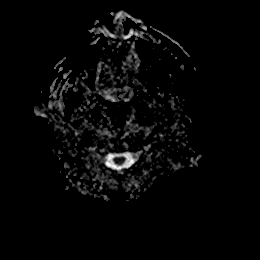
[im 17/51]
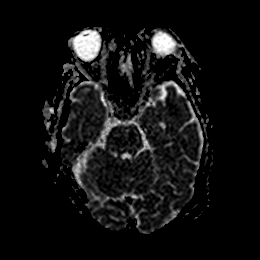
[im 34/51]
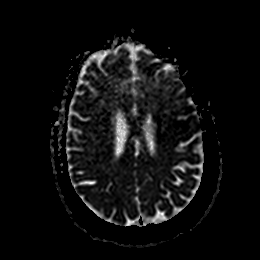
[im 51/51]
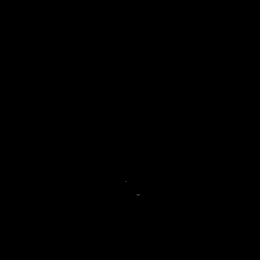

[Series 7: DWI · coronal · 4.0mm · 0.88mm/px · 6 of 76 slices shown (3 of 4)]
[im 1/76]
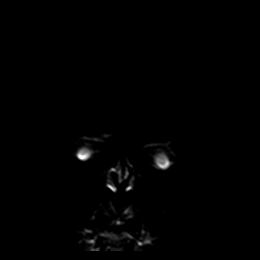
[im 16/76]
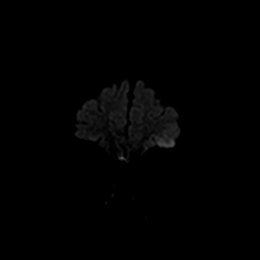
[im 31/76]
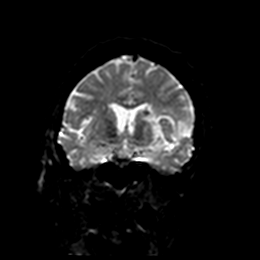
[im 46/76]
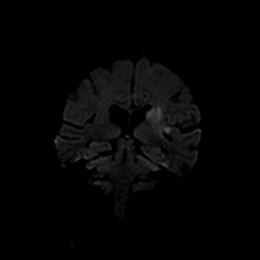
[im 61/76]
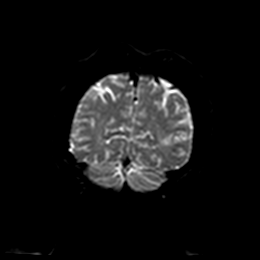
[im 76/76]
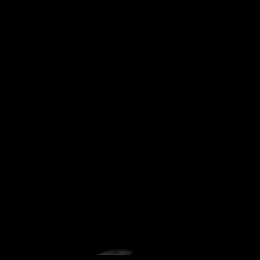

[Series 8: DWI · coronal · 4.0mm · 0.88mm/px · 3 of 38 slices shown (4 of 4)]
[im 1/38]
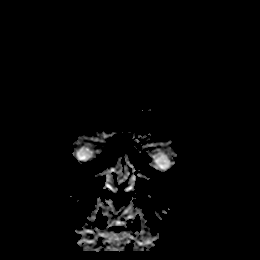
[im 19/38]
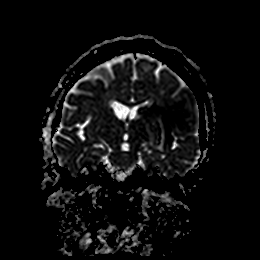
[im 38/38]
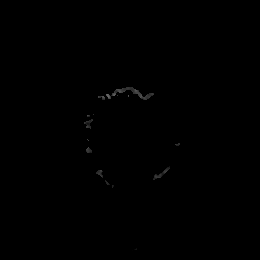

[Series 9: FLAIR · axial · 5.0mm · 0.45mm/px · z∈[-94,+50]mm · 2 of 25 slices shown]
[im 1/25]
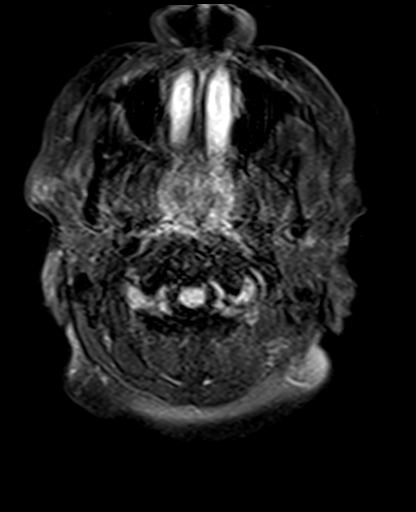
[im 25/25]
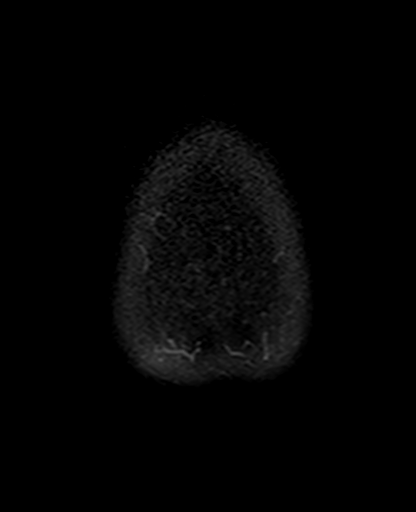

[Series 10: mag_images · axial · 3.0mm · 0.90mm/px · z∈[-98,+54]mm · 4 of 52 slices shown]
[im 1/52]
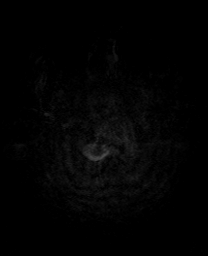
[im 18/52]
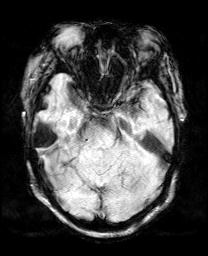
[im 35/52]
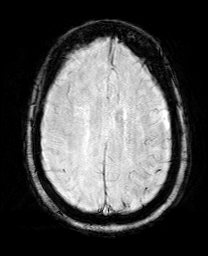
[im 52/52]
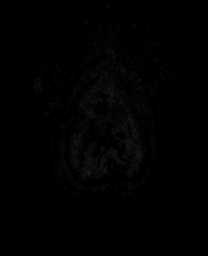

[Series 11: pha_images · axial · 3.0mm · 0.90mm/px · z∈[-98,+54]mm · 4 of 52 slices shown]
[im 1/52]
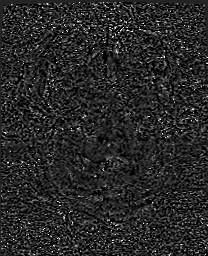
[im 18/52]
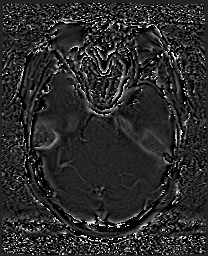
[im 35/52]
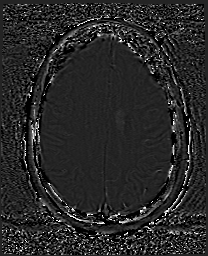
[im 52/52]
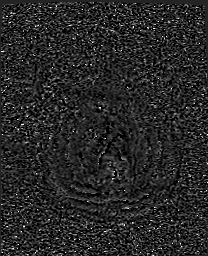

[Series 12: swi_images · axial · 3.0mm · 0.90mm/px · z∈[-98,+54]mm · 4 of 52 slices shown]
[im 1/52]
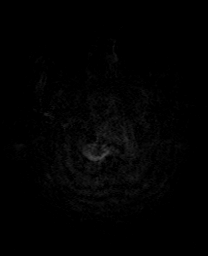
[im 18/52]
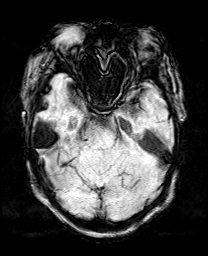
[im 35/52]
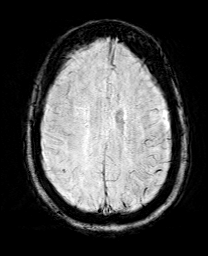
[im 52/52]
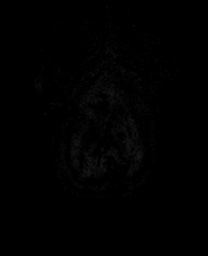

[Series 13: mip_images(sw) · axial · 24.0mm · 0.90mm/px · z∈[-88,+44]mm · 3 of 45 slices shown]
[im 1/45]
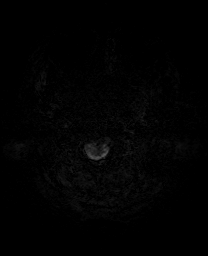
[im 23/45]
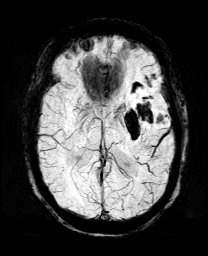
[im 45/45]
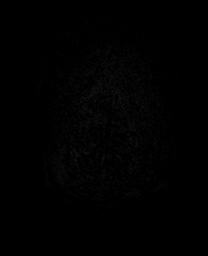

[Series 14: T2 · axial · 5.0mm · 0.90mm/px · z∈[-92,+52]mm · 2 of 25 slices shown (1 of 2)]
[im 1/25]
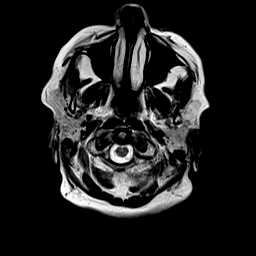
[im 25/25]
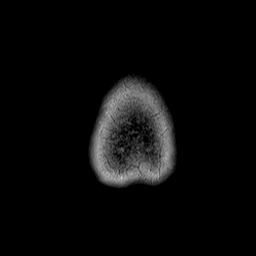

[Series 16: T1 · sagittal · 5.0mm · 0.75mm/px · 2 of 25 slices shown]
[im 1/25]
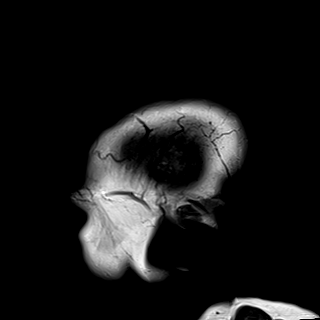
[im 25/25]
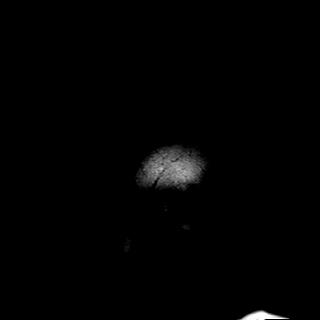

[Series 17: T2 · coronal · 5.0mm · 0.43mm/px · 2 of 32 slices shown (2 of 2)]
[im 1/32]
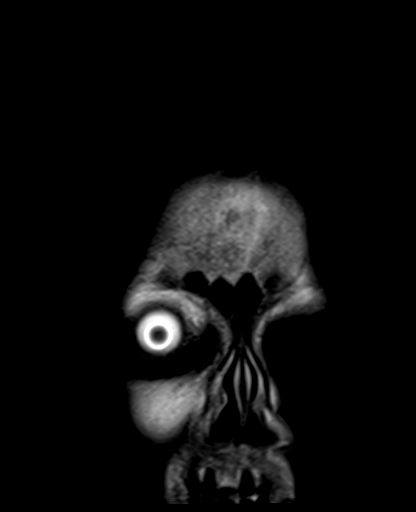
[im 32/32]
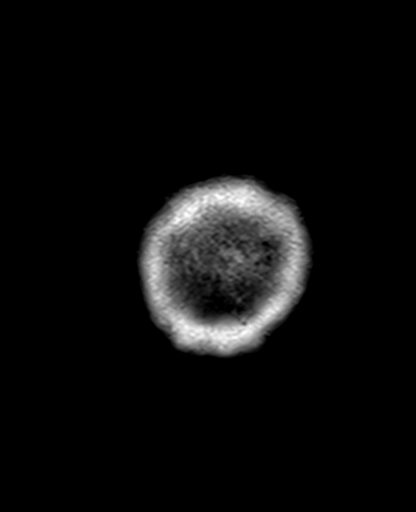

[44 of 48 positions shown; findings below may reference images not displayed]

FINDINGS: Brain: Acute infarct left MCA territory. Infarct involves the left
frontal lobe, insula as well as the caudate and putamen. There are
areas of hemorrhage within the caudate and putamen as well as in the
left frontal lobe. These are not identified on CT.

Scattered small white matter hyperintensities elsewhere compatible
with chronic microvascular ischemia. Ventricle size normal. Negative
for mass lesion.

Vascular: Normal arterial flow voids

Skull and upper cervical spine: No focal skeletal lesion

Sinuses/Orbits: Negative

Other: None
IMPRESSION: Acute infarct left MCA territory there is mild hemorrhage in the
infarct which is not seen on recent CT earlier today.

These results were called by telephone at the time of interpretation
on [DATE] at [DATE] to provider ANWR , who verbally
acknowledged these results.

## 2020-04-21 MED ORDER — ACETAMINOPHEN 160 MG/5ML PO SOLN: 650.0000 mg | ORAL | Status: DC | PRN

## 2020-04-21 MED ORDER — ASPIRIN 81 MG PO CHEW: 81.0000 mg | CHEWABLE_TABLET | Freq: Once | ORAL | Status: DC

## 2020-04-21 MED ORDER — ATORVASTATIN CALCIUM 40 MG PO TABS: 40.0000 mg | ORAL_TABLET | Freq: Every day | ORAL | Status: DC

## 2020-04-21 MED ORDER — ACETAMINOPHEN 650 MG RE SUPP: 650.0000 mg | RECTAL | Status: DC | PRN

## 2020-04-21 MED ORDER — STROKE: EARLY STAGES OF RECOVERY BOOK
Freq: Once | Status: AC
  Filled 2020-04-21 (×2): qty 1

## 2020-04-21 MED ORDER — ENOXAPARIN SODIUM 40 MG/0.4ML ~~LOC~~ SOLN: 40.0000 mg | SUBCUTANEOUS | Status: DC

## 2020-04-21 MED ORDER — IOHEXOL 350 MG/ML SOLN
75.0000 mL | Freq: Once | INTRAVENOUS | Status: AC | PRN
  Administered 2020-04-21: 75 mL via INTRAVENOUS

## 2020-04-21 MED ORDER — ACETAMINOPHEN 325 MG PO TABS
650.0000 mg | ORAL_TABLET | ORAL | Status: DC | PRN
  Administered 2020-04-23 – 2020-04-30 (×2): 650 mg via ORAL
  Filled 2020-04-21 (×2): qty 2

## 2020-04-21 NOTE — ED Triage Notes (Signed)
Per [facility] yesterday around 1630. Pts daughter was headed to work and pt was drinking alcohol and had some slurred speech but states it is her normal for her when she drinks. Today Daughter reports R side weakness and facial droop. States pt stayed in bed all day and could not get any words out.

## 2020-04-21 NOTE — Consult Note (Signed)
Requesting Physician: Dr. Jacqulyn BathLong    Chief Complaint:   History obtained from: Patient and Chart   HPI:                                                                                                                                       Lindsey BruinColleen [name] is a 64 y.o. female with past medical history significant for hypertension presents emergency department with slurred speech and right-sided weakness noted by family today.  Last seen normal by her daughter was 4:30 PM yesterday.  The daughter works evening shift and prior to her leaving patient was drinking.  This morning patient did not get up, the granddaughter noticed patient speech was gibberish but thought this was due to her being intoxicated.  Patient was in bed all day and could not getting words out and daughter finally decided to call EMS.  Patient did not arrive as a code stroke as she was outside the 24-hour window.  On arrival patient had left gaze deviation, right-sided weakness and was aphasic and severely dysarthric following only some commands.  Stat CT head was obtained which showed large acute left MCA stroke, CTA confirmed a left M2 occlusion.  Unfortunately patient not a candidate for thrombectomy due to being outside the window and this is an established infarct.   Date last known well: 7.19.21 Time last known well: 4:30 PM tPA Given: No, outside TPA window NIHSS: 16 Baseline MRS 0   Past Medical History:  Diagnosis Date  . Hypertension     History reviewed. No pertinent surgical history.  Family History  Problem Relation Age of Onset  . Breast cancer Neg Hx    Social History:  reports that she quit smoking about 41 years ago. Her smoking use included cigarettes. She smoked 0.25 packs per day. She has never used smokeless tobacco. She reports current alcohol use. She reports that she does not use drugs.  Allergies: No Known Allergies  Medications:                                                                                                                         I reviewed home medications   ROS:  Unable to review systems due to patient's mental status.  No recent fever or chills   Examination:                                                                                                      General: Appears well-developed  Psych: Affect appropriate to situation Eyes: No scleral injection HENT: No OP obstrucion Head: Normocephalic.  Cardiovascular: Normal rate and regular rhythm. Respiratory: Effort normal and breath sounds normal to anterior ascultation GI: Soft.  No distension. There is no tenderness.  Skin: WDI    Neurological Examination Mental Status: Drowsy but arousable, follows some commands.  Severely dysarthric and no intelligible  speech Cranial Nerves: II: Visual fields: Right homonymous hemianopsia,  III,IV, VI: ptosis not present, extra-ocular motions intact bilaterally, pupils equal, round, reactive to light and accommodation VII: Right facial droop XII: midline tongue extension Motor: Right : Upper extremity   0/5    Left:     Upper extremity   5/5  Lower extremity   1/5     Lower extremity   5/5 Tone and bulk:normal tone throughout; no atrophy noted Sensory: Neglect and reduced sensation on the right upper and lower extremity Plantars: Right: downgoing   Left: downgoing Cerebellar: No gross ataxia noted when moving left side      Lab Results: Basic Metabolic Panel: Recent Labs  Lab 04/21/20 1749  NA 137  K 4.0  CL 102  CO2 19*  GLUCOSE 128*  BUN 7*  CREATININE 0.70  CALCIUM 9.2    CBC: Recent Labs  Lab 04/21/20 1749  WBC 5.1  NEUTROABS 3.7  HGB 13.0  HCT 39.0  MCV 88.8  PLT 305    Coagulation Studies: Recent Labs    04/21/20 1749  LABPROT 14.0  INR 1.1    Imaging: CT Angio Head W or Wo  Contrast  Result Date: 04/21/2020 CLINICAL DATA:  Stroke.  Slurred speech EXAM: CT ANGIOGRAPHY HEAD AND NECK TECHNIQUE: Multidetector CT imaging of the head and neck was performed using the standard protocol during bolus administration of intravenous contrast. Multiplanar CT image reconstructions and MIPs were obtained to evaluate the vascular anatomy. Carotid stenosis measurements (when applicable) are obtained utilizing NASCET criteria, using the distal internal carotid diameter as the denominator. CONTRAST:  57mL OMNIPAQUE IOHEXOL 350 MG/ML SOLN COMPARISON:  CT head 04/21/2020 FINDINGS: CTA NECK FINDINGS Aortic arch: Mild atherosclerotic calcification aortic arch. Bovine branching pattern. Proximal great vessels patent. Right carotid system: Right carotid widely patent without stenosis. Minimal atherosclerotic calcification right carotid bulb. Medial deviation of the carotid bifurcation and internal carotid artery posterior to the pharynx. Left carotid system: Left carotid widely patent with mild atherosclerotic calcification at the bifurcation. Medial deviation of the carotid bifurcation internal carotid artery in the retropharyngeal space. Vertebral arteries: Left vertebral artery dominant. Left vertebral artery patent to the basilar without stenosis. Small right vertebral artery without stenosis. Skeleton: No acute skeletal abnormality. Other neck: Negative for mass or adenopathy in the neck. Upper chest: Lung apices clear bilaterally. Superior mediastinum negative. Review of the MIP images confirms  the above findings CTA HEAD FINDINGS Anterior circulation: Internal carotid artery widely patent bilaterally. Left M1 segment widely patent. There is occlusion of the proximal left M2 inferior division which appears acute. This corresponds to low-density on CT compatible with acute infarct. Superior division left MCA patent Both anterior cerebral arteries widely patent. Right middle cerebral artery and branches  widely patent. Posterior circulation: Both vertebral arteries patent to the basilar. PICA patent bilaterally. Basilar widely patent. Superior cerebellar and posterior cerebral arteries patent bilaterally without stenosis or occlusion. Venous sinuses: Early contrast enhancement of the dural sinuses not well evaluated. Anatomic variants: None Review of the MIP images confirms the above findings IMPRESSION: 1. 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 Acute occlusion inferior division left M2  . 3. No other intracranial stenosis 4. No extracranial stenosis in the carotid or vertebral arteries. 5. These results were called by telephone at the time of interpretation on 04/21/2020 at 6:59 pm to provider JOSHUA LONG , who verbally acknowledged these results. Electronically Signed   By: Marlan Palau M.D.   On: 04/21/2020 19:00   CT Head Wo Contrast  Result Date: 04/21/2020 CLINICAL DATA:  Stroke suspected, slurred speech, right-sided weakness and facial droop EXAM: CT HEAD WITHOUT CONTRAST TECHNIQUE: Contiguous axial images were obtained from the base of the skull through the vertex without intravenous contrast. COMPARISON:  None. FINDINGS: Brain: There is a hypodensity of the anterior left MCA territory centered about the insula and frontal operculum, involving the left basal ganglia, particularly the putamen and internal capsule, as well as the high left MCA territory cortex. There is no involvement of the temporal lobe. No evidence of hemorrhage. There is mild mass effect on the left lateral ventricle with approximately 3 mm left right midline shift. Vascular: Subtly hyperdense M2 segmental vessel in the left insula (series 2, image 12). Skull: Normal. Negative for fracture or focal lesion. Sinuses/Orbits: No acute finding. Other: None. IMPRESSION: 1. There is a large hypodensity of the anterior left MCA territory, findings consistent with acute to subacute left MCA territory infarction. ASPECTS 4. 2.  No evidence  of hemorrhage. 3. There is mild mass effect on the left lateral ventricle with approximately 3 mm left right midline shift. Findings reported by telephone to Dr. Jacqulyn Bath at the time of interpretation, 6:48 p.m., 04/21/2020 Electronically Signed   By: Lauralyn Primes M.D.   On: 04/21/2020 18:53   CT Angio Neck W and/or Wo Contrast  Result Date: 04/21/2020 CLINICAL DATA:  Stroke.  Slurred speech EXAM: CT ANGIOGRAPHY HEAD AND NECK TECHNIQUE: Multidetector CT imaging of the head and neck was performed using the standard protocol during bolus administration of intravenous contrast. Multiplanar CT image reconstructions and MIPs were obtained to evaluate the vascular anatomy. Carotid stenosis measurements (when applicable) are obtained utilizing NASCET criteria, using the distal internal carotid diameter as the denominator. CONTRAST:  35mL OMNIPAQUE IOHEXOL 350 MG/ML SOLN COMPARISON:  CT head 04/21/2020 FINDINGS: CTA NECK FINDINGS Aortic arch: Mild atherosclerotic calcification aortic arch. Bovine branching pattern. Proximal great vessels patent. Right carotid system: Right carotid widely patent without stenosis. Minimal atherosclerotic calcification right carotid bulb. Medial deviation of the carotid bifurcation and internal carotid artery posterior to the pharynx. Left carotid system: Left carotid widely patent with mild atherosclerotic calcification at the bifurcation. Medial deviation of the carotid bifurcation internal carotid artery in the retropharyngeal space. Vertebral arteries: Left vertebral artery dominant. Left vertebral artery patent to the basilar without stenosis. Small right vertebral artery without stenosis. Skeleton:  No acute skeletal abnormality. Other neck: Negative for mass or adenopathy in the neck. Upper chest: Lung apices clear bilaterally. Superior mediastinum negative. Review of the MIP images confirms the above findings CTA HEAD FINDINGS Anterior circulation: Internal carotid artery widely patent  bilaterally. Left M1 segment widely patent. There is occlusion of the proximal left M2 inferior division which appears acute. This corresponds to low-density on CT compatible with acute infarct. Superior division left MCA patent Both anterior cerebral arteries widely patent. Right middle cerebral artery and branches widely patent. Posterior circulation: Both vertebral arteries patent to the basilar. PICA patent bilaterally. Basilar widely patent. Superior cerebellar and posterior cerebral arteries patent bilaterally without stenosis or occlusion. Venous sinuses: Early contrast enhancement of the dural sinuses not well evaluated. Anatomic variants: None Review of the MIP images confirms the above findings IMPRESSION: 1. 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 Acute occlusion inferior division left M2  . 3. No other intracranial stenosis 4. No extracranial stenosis in the carotid or vertebral arteries. 5. These results were called by telephone at the time of interpretation on 04/21/2020 at 6:59 pm to provider JOSHUA LONG , who verbally acknowledged these results. Electronically Signed   By: Marlan Palau M.D.   On: 04/21/2020 19:00     ASSESSMENT AND PLAN  64-year female with history of hypertension, alcohol abuse presents to the ED with >24 hours LSN, right-sided weakness and aphasia with left gaze deviation.  Unfortunately has a large left MCA stroke, midline shift right now is nonsignificant but needs to be monitored. Not a candidate for acute intervention as she is outside the window.  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 Cytotoxic cerebral edema without significant midline shift   Recommend #Admit to progressive unit # MRI of the brain without contrast #Transthoracic Echo, may consider TEE #Aspirin 81 mg, SCDs #NPO, avoid hypotonic fluid such dextrose NS #High-dose statin patient passed swallow eval # BP goal: permissive HTN upto 220/120 mmHg # HBAIC and Lipid  profile # Telemetry monitoring # Frequent neuro checks #Blood cultures #Urine drug screen  Hypertension -BP permissive up to 220/120 mmHg  DVT prophylaxis: SCDs for now   This patient is neurologically critically ill due to left MCA stroke with cytotoxic cerebral edema.  She is at risk for significant risk of neurological worsening from cerebral edema,  death from brain herniation, heart failure, hemorrhagic conversion, infection, respiratory failure and seizure. This patient's care requires constant monitoring of vital signs, hemodynamics, respiratory and cardiac monitoring, review of multiple databases, neurological assessment, discussion with family, other specialists and medical decision making of high complexity.  I spent 45 minutes of neurocritical time in the care of this patient.        Please page stroke NP  Or  PA  Or MD from 8am -4 pm  as this patient from this time will be  followed by the stroke.   You can look them up on [email or web address]  Password [facility]  Yazlynn Birkeland [facility] Pager Number [phone]

## 2020-04-21 NOTE — ED Notes (Signed)
Pt to MRI at this time, Primary nurse Mikayla RN made aware.

## 2020-04-21 NOTE — ED Provider Notes (Signed)
Emergency Department Provider Note   I have reviewed the triage vital signs and the nursing notes.   HISTORY  Chief Complaint Altered Mental Status   HPI Lindsey [name] is a 64 y.o. female with past medical history of hypertension and alcohol use presents to the emergency department with strokelike symptoms. Last normal 16:30 yesterday per daughter. According to EMS the patient had some slurred speech yesterday but the daughter did not think much of it because of her alcohol use. Today, the daughter noticed some right-sided weakness and that the patient had not been up and walking around like she normally would. She has been in bed all day and ultimately EMS was called for evaluation. EMS arrived to find the patient with right side weakness and decreased alertness.   Level 5 caveat: Dysarthria and stroke-like symptoms.    Past Medical History:  Diagnosis Date   Hypertension     Patient Active Problem List   Diagnosis Date Noted   CVA (cerebral vascular accident) (HCC) 04/21/2020    History reviewed. No pertinent surgical history.  Allergies Patient has no known allergies.  Family History  Problem Relation Age of Onset   Breast cancer Neg Hx     Social History Social History   Tobacco Use   Smoking status: Former Smoker    Packs/day: 0.25    Types: Cigarettes    Quit date: 10/27/1978    Years since quitting: 41.5   Smokeless tobacco: Never Used  Substance Use Topics   Alcohol use: Yes    Alcohol/week: 0.0 standard drinks    Comment: states once a wine/beer occassionally    Drug use: No    Review of Systems  Level 5 caveat: Stroke symptoms and dysarthria.   ____________________________________________   PHYSICAL EXAM:  VITAL SIGNS: Temp: 98.3 F Pulse: 125 Resp: 18 SpO2: 100% RA  Constitutional: Opens eyes to voice. Notable facial droop and right side weakness.  Eyes: Conjunctivae are normal. Left gaze deviation not moving beyond midline.    Head: Atraumatic. Nose: No congestion/rhinnorhea. Mouth/Throat: Mucous membranes are moist.  Neck: No stridor.  Cardiovascular: Normal rate, regular rhythm. Good peripheral circulation. Grossly normal heart sounds.   Respiratory: Normal respiratory effort.  No retractions. Lungs CTAB. Gastrointestinal: Soft and nontender. No distention.  Musculoskeletal: No gross deformities of extremities. Neurologic: Significant dysarthria and right face droop. Flaccid paralysis of the RUE and RLE. Left gaze deviation unable to cross midline. NIHSS 16.  Skin:  Skin is warm, dry and intact. No rash noted.  ____________________________________________   LABS (all labs ordered are listed, but only abnormal results are displayed)  Labs Reviewed  COMPREHENSIVE METABOLIC PANEL - Abnormal; Notable for the following components:      Result Value   CO2 19 (*)    Glucose, Bld 128 (*)    BUN 7 (*)    Total Protein 8.4 (*)    AST 56 (*)    Total Bilirubin 1.4 (*)    Anion gap 16 (*)    All other components within normal limits  RAPID URINE DRUG SCREEN, HOSP PERFORMED - Abnormal; Notable for the following components:   Tetrahydrocannabinol POSITIVE (*)    All other components within normal limits  URINALYSIS, ROUTINE W REFLEX MICROSCOPIC - Abnormal; Notable for the following components:   Specific Gravity, Urine 1.042 (*)    Ketones, ur 20 (*)    All other components within normal limits  CBG MONITORING, ED - Abnormal; Notable for the following components:  Glucose-Capillary 133 (*)    All other components within normal limits  SARS CORONAVIRUS 2 BY RT PCR (HOSPITAL ORDER, PERFORMED IN [facility])  CULTURE, BLOOD (ROUTINE X 2)  CULTURE, BLOOD (ROUTINE X 2)  ETHANOL  PROTIME-INR  APTT  CBC  DIFFERENTIAL  HIV ANTIBODY (ROUTINE TESTING W REFLEX)  HEMOGLOBIN A1C  LIPID PANEL   ____________________________________________  EKG   EKG Interpretation  Date/Time:  Tuesday April 21 2020 17:52:29 EDT Ventricular Rate:  121 PR Interval:    QRS Duration: 106 QT Interval:  313 QTC Calculation: 430 R Axis:   37 Text Interpretation: Atrial fibrillation Ventricular premature complex Abnormal R-wave progression, early transition Borderline T abnormalities, diffuse leads No old tracings for comparsion. No STEMI Confirmed by Alona BeneLong, Cala Kruckenberg [phone]([id number]) on 04/21/2020 6:10:26 PM       ____________________________________________  RADIOLOGY  CT Angio Head W or Wo Contrast  Result Date: 04/21/2020 CLINICAL DATA:  Stroke.  Slurred speech EXAM: CT ANGIOGRAPHY HEAD AND NECK TECHNIQUE: Multidetector CT imaging of the head and neck was performed using the standard protocol during bolus administration of intravenous contrast. Multiplanar CT image reconstructions and MIPs were obtained to evaluate the vascular anatomy. Carotid stenosis measurements (when applicable) are obtained utilizing NASCET criteria, using the distal internal carotid diameter as the denominator. CONTRAST:  75mL OMNIPAQUE IOHEXOL 350 MG/ML SOLN COMPARISON:  CT head 04/21/2020 FINDINGS: CTA NECK FINDINGS Aortic arch: Mild atherosclerotic calcification aortic arch. Bovine branching pattern. Proximal great vessels patent. Right carotid system: Right carotid widely patent without stenosis. Minimal atherosclerotic calcification right carotid bulb. Medial deviation of the carotid bifurcation and internal carotid artery posterior to the pharynx. Left carotid system: Left carotid widely patent with mild atherosclerotic calcification at the bifurcation. Medial deviation of the carotid bifurcation internal carotid artery in the retropharyngeal space. Vertebral arteries: Left vertebral artery dominant. Left vertebral artery patent to the basilar without stenosis. Small right vertebral artery without stenosis. Skeleton: No acute skeletal abnormality. Other neck: Negative for mass or adenopathy in the neck. Upper chest: Lung apices clear  bilaterally. Superior mediastinum negative. Review of the MIP images confirms the above findings CTA HEAD FINDINGS Anterior circulation: Internal carotid artery widely patent bilaterally. Left M1 segment widely patent. There is occlusion of the proximal left M2 inferior division which appears acute. This corresponds to low-density on CT compatible with acute infarct. Superior division left MCA patent Both anterior cerebral arteries widely patent. Right middle cerebral artery and branches widely patent. Posterior circulation: Both vertebral arteries patent to the basilar. PICA patent bilaterally. Basilar widely patent. Superior cerebellar and posterior cerebral arteries patent bilaterally without stenosis or occlusion. Venous sinuses: Early contrast enhancement of the dural sinuses not well evaluated. Anatomic variants: None Review of the MIP images confirms the above findings IMPRESSION: 1. 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 Acute occlusion inferior division left M2  . 3. No other intracranial stenosis 4. No extracranial stenosis in the carotid or vertebral arteries. 5. These results were called by telephone at the time of interpretation on 04/21/2020 at 6:59 pm to provider Carol Theys , who verbally acknowledged these results. Electronically Signed   By: Marlan Palauharles  Clark M.D.   On: 04/21/2020 19:00   CT Head Wo Contrast  Result Date: 04/21/2020 CLINICAL DATA:  Stroke suspected, slurred speech, right-sided weakness and facial droop EXAM: CT HEAD WITHOUT CONTRAST TECHNIQUE: Contiguous axial images were obtained from the base of the skull through the vertex without intravenous contrast. COMPARISON:  None. FINDINGS: Brain:  There is a hypodensity of the anterior left MCA territory centered about the insula and frontal operculum, involving the left basal ganglia, particularly the putamen and internal capsule, as well as the high left MCA territory cortex. There is no involvement of the temporal lobe. No evidence  of hemorrhage. There is mild mass effect on the left lateral ventricle with approximately 3 mm left right midline shift. Vascular: Subtly hyperdense M2 segmental vessel in the left insula (series 2, image 12). Skull: Normal. Negative for fracture or focal lesion. Sinuses/Orbits: No acute finding. Other: None. IMPRESSION: 1. There is a large hypodensity of the anterior left MCA territory, findings consistent with acute to subacute left MCA territory infarction. ASPECTS 4. 2.  No evidence of hemorrhage. 3. There is mild mass effect on the left lateral ventricle with approximately 3 mm left right midline shift. Findings reported by telephone to Dr. Jacqulyn Bath at the time of interpretation, 6:48 p.m., 04/21/2020 Electronically Signed   By: Lauralyn Primes M.D.   On: 04/21/2020 18:53   CT Angio Neck W and/or Wo Contrast  Result Date: 04/21/2020 CLINICAL DATA:  Stroke.  Slurred speech EXAM: CT ANGIOGRAPHY HEAD AND NECK TECHNIQUE: Multidetector CT imaging of the head and neck was performed using the standard protocol during bolus administration of intravenous contrast. Multiplanar CT image reconstructions and MIPs were obtained to evaluate the vascular anatomy. Carotid stenosis measurements (when applicable) are obtained utilizing NASCET criteria, using the distal internal carotid diameter as the denominator. CONTRAST:  59mL OMNIPAQUE IOHEXOL 350 MG/ML SOLN COMPARISON:  CT head 04/21/2020 FINDINGS: CTA NECK FINDINGS Aortic arch: Mild atherosclerotic calcification aortic arch. Bovine branching pattern. Proximal great vessels patent. Right carotid system: Right carotid widely patent without stenosis. Minimal atherosclerotic calcification right carotid bulb. Medial deviation of the carotid bifurcation and internal carotid artery posterior to the pharynx. Left carotid system: Left carotid widely patent with mild atherosclerotic calcification at the bifurcation. Medial deviation of the carotid bifurcation internal carotid artery in  the retropharyngeal space. Vertebral arteries: Left vertebral artery dominant. Left vertebral artery patent to the basilar without stenosis. Small right vertebral artery without stenosis. Skeleton: No acute skeletal abnormality. Other neck: Negative for mass or adenopathy in the neck. Upper chest: Lung apices clear bilaterally. Superior mediastinum negative. Review of the MIP images confirms the above findings CTA HEAD FINDINGS Anterior circulation: Internal carotid artery widely patent bilaterally. Left M1 segment widely patent. There is occlusion of the proximal left M2 inferior division which appears acute. This corresponds to low-density on CT compatible with acute infarct. Superior division left MCA patent Both anterior cerebral arteries widely patent. Right middle cerebral artery and branches widely patent. Posterior circulation: Both vertebral arteries patent to the basilar. PICA patent bilaterally. Basilar widely patent. Superior cerebellar and posterior cerebral arteries patent bilaterally without stenosis or occlusion. Venous sinuses: Early contrast enhancement of the dural sinuses not well evaluated. Anatomic variants: None Review of the MIP images confirms the above findings IMPRESSION: 1. 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 Acute occlusion inferior division left M2  . 3. No other intracranial stenosis 4. No extracranial stenosis in the carotid or vertebral arteries. 5. These results were called by telephone at the time of interpretation on 04/21/2020 at 6:59 pm to provider Dontavious Emily , who verbally acknowledged these results. Electronically Signed   By: Marlan Palau M.D.   On: 04/21/2020 19:00    ____________________________________________   PROCEDURES  Procedure(s) performed:   Procedures  CRITICAL CARE Performed by: Maia Plan  Total critical care time: 35 minutes Critical care time was exclusive of separately billable procedures and treating other patients. Critical care  was necessary to treat or prevent imminent or life-threatening deterioration. Critical care was time spent personally by me on the following activities: development of treatment plan with patient and/or surrogate as well as nursing, discussions with consultants, evaluation of patient's response to treatment, examination of patient, obtaining history from patient or surrogate, ordering and performing treatments and interventions, ordering and review of laboratory studies, ordering and review of radiographic studies, pulse oximetry and re-evaluation of patient's condition.  Alona Bene, MD Emergency Medicine  ____________________________________________   INITIAL IMPRESSION / ASSESSMENT AND PLAN / ED COURSE  Pertinent labs & imaging results that were available during my care of the patient were reviewed by me and considered in my medical decision making (see chart for details).   Patient arrives by EMS with significant right side weakness and dysarthria.  Last normal reportedly 16:30 yesterday.  Daughter apparently in route to the hospital and cannot confirm timeframe.  Given this, patient is not a code stroke at this time.  Have added CTA of the head and neck along with stroke labs.  06:05 PM  Patient heading over to CT now after discussion with CT tech asking to defer the angio imaging for now but go ahead with a Noncon head CT to rule out bleed.  Spoke with Dr. Lyman Bishop by phone who will consult on the patient.  Daughter now at bedside and discussed case with her.  She was able to confirm the last normal of 16:30 yesterday before going to work. EKG does show a-fib which appears to be new.   06:20 PM  Dr. Lyman Bishop has reviewed the CT head with infarct of the left MCA territory and seen the patient. No bleeding. Will need progressive unit. Will add on ASA 81 mg and SQ heparin preferred over Lovenox.   Discussed patient's case with [facility], Dr. Benita Gutter to request admission. Patient and family (if present)  updated with plan. Care transferred to [facility] service.  I reviewed all nursing notes, vitals, pertinent old records, EKGs, labs, imaging (as available).  Patient failed swallow screen and so ASA was held.  ____________________________________________  FINAL CLINICAL IMPRESSION(S) / ED DIAGNOSES  Final diagnoses:  Cerebrovascular accident (CVA) due to embolism of cerebral artery (HCC)     MEDICATIONS GIVEN DURING THIS VISIT:  Medications   stroke: mapping our early stages of recovery book (has no administration in time range)  acetaminophen (TYLENOL) tablet 650 mg (has no administration in time range)    Or  acetaminophen (TYLENOL) 160 MG/5ML solution 650 mg (has no administration in time range)    Or  acetaminophen (TYLENOL) suppository 650 mg (has no administration in time range)  enoxaparin (LOVENOX) injection 40 mg (has no administration in time range)  iohexol (OMNIPAQUE) 350 MG/ML injection 75 mL (75 mLs Intravenous Contrast Given 04/21/20 1833)     Note:  This document was prepared using Dragon voice recognition software and may include unintentional dictation errors.  Alona Bene, MD, [facility] Emergency Medicine    Mackenzie Groom, Arlyss Repress, MD 04/21/20 2053

## 2020-04-21 NOTE — H&P (Addendum)
History and Physical    [name]:[id number] DOB: Nov 30, 1955 DOA: 04/21/2020  PCP: Emogene Morgan, MD  Patient coming from: Home  I have personally briefly reviewed patient's old medical records in [facility]  Chief Complaint: Right sided weakness and slurred speech  HPI: Lindsey [name] is a 64 y.o. female with medical history significant for hypertension and alcohol use who presents with concerns of right-sided weakness and slurred speech.  History obtained in its entirety from ED documentation since patient is severely dysarthric and aphasic and unable to provide history.  Reportedly she was last known normal at 4:30 PM yesterday by her daughter who then went to work.  Then the following morning did notice that patient did not get up out of bed and had slurred speech and they thought that she was just intoxicated.  Patient laid in bed all day and finally daughter decided to call EMS.  Patient did not present as a code stroke as she was also at the 24-hour window.  ED Course:  Patient was dysarthric/aphasic with left-sided gaze and right-sided hemiparesis. She was afebrile, in atrial fibrillation with rates nonsustained up to 140 at times.  CBC negative.  Blood glucose of 128, mildly elevated anion gap of 16.  Normal creatinine of 0.7.  UDS positive for THC.  CT head shows acute to subacute left MCA territorial infarct with mild mass-effect on the left lateral ventricle with 3 mm left-to-right midline shift.  There is also an acute occlusion to the left M2 on CTA head and neck.  Neurology has evaluated patient at bedside.  Review of Systems:  Unable to obtain due to patient being aphasic  Past Medical History:  Diagnosis Date  . Hypertension     Unable to obtain surgical hx due to patient's aphasia   reports that she quit smoking about 41 years ago. Her smoking use included cigarettes. She smoked 0.25 packs per day. She has never used smokeless tobacco. She reports  current alcohol use. She reports that she does not use drugs.  No Known Allergies  Family History  Problem Relation Age of Onset  . Breast cancer Neg Hx      Prior to Admission medications   Medication Sig Start Date End Date Taking? Authorizing Provider  amLODipine (NORVASC) 10 MG tablet Take 10 mg by mouth daily.   Yes [provider]  atenolol (TENORMIN) 25 MG tablet Take 25 mg by mouth daily.   Yes [provider]  diazepam (VALIUM) 2 MG tablet Take 1 tablet (2 mg total) by mouth every 8 (eight) hours as needed. Patient taking differently: Take 2 mg by mouth every 8 (eight) hours as needed for muscle spasms.  11/23/16  Yes Triplett, Cari B, FNP  diphenhydrAMINE (BENADRYL) 25 MG tablet Take 25 mg by mouth every 6 (six) hours as needed for itching.    Yes [provider]  hydrOXYzine (ATARAX/VISTARIL) 25 MG tablet Take 25 mg by mouth 3 (three) times daily as needed for itching.    Yes [provider]  lidocaine (LIDODERM) 5 % Place 1 patch onto the skin daily. Remove & Discard patch within 12 hours or as directed by MD   Yes [provider]  naproxen (NAPROSYN) 500 MG tablet Take 1 tablet (500 mg total) by mouth 2 (two) times daily with a meal. 11/23/16  Yes Triplett, Cari B, FNP  oxyCODONE-acetaminophen (PERCOCET) 5-325 MG tablet Take 1 tablet by mouth every 6 (six) hours as needed for severe  pain. 05/02/19  Yes Joni ReiningSmith, Ronald K, PA-C  cetirizine (ZYRTEC) 10 MG tablet Take 1 tablet (10 mg total) by mouth daily for 7 days. 02/23/19 03/02/19  Nita SickleVeronese, Joffre, MD  hydrOXYzine (ATARAX/VISTARIL) 25 MG tablet Take 1 tablet (25 mg total) by mouth 3 (three) times daily as needed. Patient not taking: Reported on 04/21/2020 05/02/19   Joni ReiningSmith, Ronald K, PA-C  sulfamethoxazole-trimethoprim (BACTRIM DS,SEPTRA DS) 800-160 MG tablet Take 1 tablet by mouth 2 (two) times daily. Patient not taking: Reported on 04/21/2020 11/23/16   Chinita Pesterriplett, Cari B, FNP    Physical  Exam: Vitals:   04/21/20 1757 04/21/20 1800 04/21/20 1848 04/21/20 1900  BP: (!) 135/94 (!) 155/110 (!) 138/98 (!) 142/100  Pulse: (!) 125 74  66  Resp: 18     Temp: 98.3 F (36.8 C)     TempSrc: Oral     SpO2: 100% 100%  99%    Constitutional: NAD, calm, comfortable, female laying flat in bed asleep and would drift off frequently back to sleep during evaluation Vitals:   04/21/20 1757 04/21/20 1800 04/21/20 1848 04/21/20 1900  BP: (!) 135/94 (!) 155/110 (!) 138/98 (!) 142/100  Pulse: (!) 125 74  66  Resp: 18     Temp: 98.3 F (36.8 C)     TempSrc: Oral     SpO2: 100% 100%  99%   Eyes: PERRL, lids and conjunctivae normal, fixed left-sided gaze. ENMT: Mucous membranes are moist. Neck: normal, supple Respiratory: clear to auscultation bilaterally, no wheezing, no crackles. Normal respiratory effort. No accessory muscle use.  Cardiovascular: Regular rate and rhythm, no murmurs / rubs / gallops. No extremity edema. 2+ pedal pulses. No carotid bruits.  Abdomen: no tenderness, no masses palpated.  Bowel sounds positive.  Musculoskeletal: no clubbing / cyanosis. No joint deformity upper and lower extremities.  no contractures. Normal muscle tone.  Skin: no rashes, lesions, ulcers. No induration Neurologic: Patient alert and awoke easily to voice but has dysarthria and intelligible speech.  Left-sided fixed gaze.  She was able to follow commands due to left-sided handgrip and lift her left leg.  Unable to move her right upper or lower extremity with command. Psychiatric: Alert but dysarthric/aphasic    Labs on Admission: I have personally reviewed following labs and imaging studies  CBC: Recent Labs  Lab 04/21/20 1749  WBC 5.1  NEUTROABS 3.7  HGB 13.0  HCT 39.0  MCV 88.8  PLT 305   Basic Metabolic Panel: Recent Labs  Lab 04/21/20 1749  NA 137  K 4.0  CL 102  CO2 19*  GLUCOSE 128*  BUN 7*  CREATININE 0.70  CALCIUM 9.2   GFR: CrCl cannot be calculated (Unknown  ideal weight.). Liver Function Tests: Recent Labs  Lab 04/21/20 1749  AST 56*  ALT 38  ALKPHOS 79  BILITOT 1.4*  PROT 8.4*  ALBUMIN 3.8   No results for input(s): LIPASE, AMYLASE in the last 168 hours. No results for input(s): AMMONIA in the last 168 hours. Coagulation Profile: Recent Labs  Lab 04/21/20 1749  INR 1.1   Cardiac Enzymes: No results for input(s): CKTOTAL, CKMB, CKMBINDEX, TROPONINI in the last 168 hours. BNP (last 3 results) No results for input(s): PROBNP in the last 8760 hours. HbA1C: No results for input(s): HGBA1C in the last 72 hours. CBG: Recent Labs  Lab 04/21/20 1743  GLUCAP 133*   Lipid Profile: No results for input(s): CHOL, HDL, LDLCALC, TRIG, CHOLHDL, LDLDIRECT in the last 72 hours. Thyroid Function Tests:  No results for input(s): TSH, T4TOTAL, FREET4, T3FREE, THYROIDAB in the last 72 hours. Anemia Panel: No results for input(s): VITAMINB12, FOLATE, FERRITIN, TIBC, IRON, RETICCTPCT in the last 72 hours. Urine analysis:    Component Value Date/Time   COLORURINE YELLOW 04/21/2020 2000   APPEARANCEUR CLEAR 04/21/2020 2000   APPEARANCEUR Clear 08/17/2014 0948   LABSPEC 1.042 (H) 04/21/2020 2000   LABSPEC 1.018 08/17/2014 0948   PHURINE 6.0 04/21/2020 2000   GLUCOSEU NEGATIVE 04/21/2020 2000   GLUCOSEU Negative 08/17/2014 0948   HGBUR NEGATIVE 04/21/2020 2000   BILIRUBINUR NEGATIVE 04/21/2020 2000   BILIRUBINUR Negative 08/17/2014 0948   KETONESUR 20 (A) 04/21/2020 2000   PROTEINUR NEGATIVE 04/21/2020 2000   NITRITE NEGATIVE 04/21/2020 2000   LEUKOCYTESUR NEGATIVE 04/21/2020 2000   LEUKOCYTESUR Negative 08/17/2014 0948    Radiological Exams on Admission: CT Angio Head W or Wo Contrast  Result Date: 04/21/2020 CLINICAL DATA:  Stroke.  Slurred speech EXAM: CT ANGIOGRAPHY HEAD AND NECK TECHNIQUE: Multidetector CT imaging of the head and neck was performed using the standard protocol during bolus administration of intravenous contrast.  Multiplanar CT image reconstructions and MIPs were obtained to evaluate the vascular anatomy. Carotid stenosis measurements (when applicable) are obtained utilizing NASCET criteria, using the distal internal carotid diameter as the denominator. CONTRAST:  60mL OMNIPAQUE IOHEXOL 350 MG/ML SOLN COMPARISON:  CT head 04/21/2020 FINDINGS: CTA NECK FINDINGS Aortic arch: Mild atherosclerotic calcification aortic arch. Bovine branching pattern. Proximal great vessels patent. Right carotid system: Right carotid widely patent without stenosis. Minimal atherosclerotic calcification right carotid bulb. Medial deviation of the carotid bifurcation and internal carotid artery posterior to the pharynx. Left carotid system: Left carotid widely patent with mild atherosclerotic calcification at the bifurcation. Medial deviation of the carotid bifurcation internal carotid artery in the retropharyngeal space. Vertebral arteries: Left vertebral artery dominant. Left vertebral artery patent to the basilar without stenosis. Small right vertebral artery without stenosis. Skeleton: No acute skeletal abnormality. Other neck: Negative for mass or adenopathy in the neck. Upper chest: Lung apices clear bilaterally. Superior mediastinum negative. Review of the MIP images confirms the above findings CTA HEAD FINDINGS Anterior circulation: Internal carotid artery widely patent bilaterally. Left M1 segment widely patent. There is occlusion of the proximal left M2 inferior division which appears acute. This corresponds to low-density on CT compatible with acute infarct. Superior division left MCA patent Both anterior cerebral arteries widely patent. Right middle cerebral artery and branches widely patent. Posterior circulation: Both vertebral arteries patent to the basilar. PICA patent bilaterally. Basilar widely patent. Superior cerebellar and posterior cerebral arteries patent bilaterally without stenosis or occlusion. Venous sinuses: Early  contrast enhancement of the dural sinuses not well evaluated. Anatomic variants: None Review of the MIP images confirms the above findings IMPRESSION: 1. 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 Acute occlusion inferior division left M2  . 3. No other intracranial stenosis 4. No extracranial stenosis in the carotid or vertebral arteries. 5. These results were called by telephone at the time of interpretation on 04/21/2020 at 6:59 pm to provider JOSHUA LONG , who verbally acknowledged these results. Electronically Signed   By: Marlan Palau M.D.   On: 04/21/2020 19:00   CT Head Wo Contrast  Result Date: 04/21/2020 CLINICAL DATA:  Stroke suspected, slurred speech, right-sided weakness and facial droop EXAM: CT HEAD WITHOUT CONTRAST TECHNIQUE: Contiguous axial images were obtained from the base of the skull through the vertex without intravenous contrast. COMPARISON:  None. FINDINGS: Brain: There is a  hypodensity of the anterior left MCA territory centered about the insula and frontal operculum, involving the left basal ganglia, particularly the putamen and internal capsule, as well as the high left MCA territory cortex. There is no involvement of the temporal lobe. No evidence of hemorrhage. There is mild mass effect on the left lateral ventricle with approximately 3 mm left right midline shift. Vascular: Subtly hyperdense M2 segmental vessel in the left insula (series 2, image 12). Skull: Normal. Negative for fracture or focal lesion. Sinuses/Orbits: No acute finding. Other: None. IMPRESSION: 1. There is a large hypodensity of the anterior left MCA territory, findings consistent with acute to subacute left MCA territory infarction. ASPECTS 4. 2.  No evidence of hemorrhage. 3. There is mild mass effect on the left lateral ventricle with approximately 3 mm left right midline shift. Findings reported by telephone to Dr. Jacqulyn Bath at the time of interpretation, 6:48 p.m., 04/21/2020 Electronically Signed   By: Lauralyn Primes M.D.   On: 04/21/2020 18:53   CT Angio Neck W and/or Wo Contrast  Result Date: 04/21/2020 CLINICAL DATA:  Stroke.  Slurred speech EXAM: CT ANGIOGRAPHY HEAD AND NECK TECHNIQUE: Multidetector CT imaging of the head and neck was performed using the standard protocol during bolus administration of intravenous contrast. Multiplanar CT image reconstructions and MIPs were obtained to evaluate the vascular anatomy. Carotid stenosis measurements (when applicable) are obtained utilizing NASCET criteria, using the distal internal carotid diameter as the denominator. CONTRAST:  66mL OMNIPAQUE IOHEXOL 350 MG/ML SOLN COMPARISON:  CT head 04/21/2020 FINDINGS: CTA NECK FINDINGS Aortic arch: Mild atherosclerotic calcification aortic arch. Bovine branching pattern. Proximal great vessels patent. Right carotid system: Right carotid widely patent without stenosis. Minimal atherosclerotic calcification right carotid bulb. Medial deviation of the carotid bifurcation and internal carotid artery posterior to the pharynx. Left carotid system: Left carotid widely patent with mild atherosclerotic calcification at the bifurcation. Medial deviation of the carotid bifurcation internal carotid artery in the retropharyngeal space. Vertebral arteries: Left vertebral artery dominant. Left vertebral artery patent to the basilar without stenosis. Small right vertebral artery without stenosis. Skeleton: No acute skeletal abnormality. Other neck: Negative for mass or adenopathy in the neck. Upper chest: Lung apices clear bilaterally. Superior mediastinum negative. Review of the MIP images confirms the above findings CTA HEAD FINDINGS Anterior circulation: Internal carotid artery widely patent bilaterally. Left M1 segment widely patent. There is occlusion of the proximal left M2 inferior division which appears acute. This corresponds to low-density on CT compatible with acute infarct. Superior division left MCA patent Both anterior cerebral  arteries widely patent. Right middle cerebral artery and branches widely patent. Posterior circulation: Both vertebral arteries patent to the basilar. PICA patent bilaterally. Basilar widely patent. Superior cerebellar and posterior cerebral arteries patent bilaterally without stenosis or occlusion. Venous sinuses: Early contrast enhancement of the dural sinuses not well evaluated. Anatomic variants: None Review of the MIP images confirms the above findings IMPRESSION: 1. 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 Acute occlusion inferior division left M2  . 3. No other intracranial stenosis 4. No extracranial stenosis in the carotid or vertebral arteries. 5. These results were called by telephone at the time of interpretation on 04/21/2020 at 6:59 pm to provider JOSHUA LONG , who verbally acknowledged these results. Electronically Signed   By: Marlan Palau M.D.   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 Chestine Spore that there are hemorrhagic conversion of the stroke in multiple places to her left hemisphere.  Discussed case with Dr. Amada Jupiter of neurology and he recommends holding any antiplatelets and DVT prophylaxis.  Will need repeat CT head imaging in the morning but no urgent transfers are needed overnight. echocardiogram  start atorvastatin Obtain A1c and lipids Keep NPO and avoid hypotonic fluid such as dextrose NS PT/OT/SLT Frequent neuro checks and keep on telemetry Allow for permissive hypertension with blood pressure treatment as needed only if systolic goes above 220 UDS positive for TSH  Newly diagnosed atrial fibrillation with non-sustained RVR CHA2DS2-VASc score of 2  No anticoagulation due to risk of hemorrhagic stroke conversion Patient noted to have heart rate anywhere from  110-140 during my evaluation.  I discussed case with on-call cardiology fellow regarding concerns of giving her a rate control but also the need to allow for permissive hypertension due to her significant MCA infarct.  He advised that no intervention be given at this point unless she has any hypotension or becomes more symptomatic. Will continue to monitor closely telemetry   Hypertension Allow permissive hypertension up to 220/120 mmHg Hold home antihypertensives  Alcohol use  Unsure of how much patient consumes at baseline Will monitor vitals and clinical status for now before placing on CIWA protocol since she needs judicious use of any benzos at this point with her massive stroke   DVT prophylaxis:.SCD  code Status: Full Family Communication: Plan discussed with patient at bedside  disposition Plan: Home with at least 2 midnight stays  Consults called:  Admission status: inpatient   Status is: Inpatient  Remains inpatient appropriate because:Inpatient level of care appropriate due to severity of illness   Dispo: The patient is from: Home              Anticipated d/c is to: Home              Anticipated d/c date is: 3 days              Patient currently is not medically stable to d/c.         Anselm Jungling DO [facility]   If 7PM-7AM, please contact night-coverage [email or web address]   04/21/2020, 8:47 PM

## 2020-04-22 ENCOUNTER — Inpatient Hospital Stay (HOSPITAL_COMMUNITY): Payer: Medicaid Other

## 2020-04-22 ENCOUNTER — Encounter (HOSPITAL_COMMUNITY): Payer: Self-pay | Admitting: Family Medicine

## 2020-04-22 DIAGNOSIS — I639 Cerebral infarction, unspecified: Secondary | ICD-10-CM

## 2020-04-22 DIAGNOSIS — I4891 Unspecified atrial fibrillation: Secondary | ICD-10-CM

## 2020-04-22 DIAGNOSIS — I6389 Other cerebral infarction: Secondary | ICD-10-CM

## 2020-04-22 LAB — LIPID PANEL
Cholesterol: 203 mg/dL — ABNORMAL HIGH (ref 0–200)
HDL: 62 mg/dL (ref >40)
LDL Cholesterol: 127 mg/dL — ABNORMAL HIGH (ref 0–99)
Total CHOL/HDL Ratio: 3.3 RATIO
Triglycerides: 69 mg/dL (ref <150)
VLDL: 14 mg/dL (ref 0–40)

## 2020-04-22 LAB — ECHOCARDIOGRAM COMPLETE
Area-P 1/2: 4.28 cm2
Calc EF: 59.6 %
S' Lateral: 2.73 cm
Single Plane A2C EF: 58.3 %
Single Plane A4C EF: 63.8 %
Weight: 2673.74 oz

## 2020-04-22 LAB — GLUCOSE, CAPILLARY: Glucose-Capillary: 116 mg/dL — ABNORMAL HIGH (ref 70–99)

## 2020-04-22 LAB — MRSA PCR SCREENING: MRSA by PCR: NEGATIVE

## 2020-04-22 LAB — HEMOGLOBIN A1C
Hgb A1c MFr Bld: 6.1 % — ABNORMAL HIGH (ref 4.8–5.6)
Mean Plasma Glucose: 128.37 mg/dL

## 2020-04-22 LAB — HIV ANTIBODY (ROUTINE TESTING W REFLEX): HIV Screen 4th Generation wRfx: NONREACTIVE

## 2020-04-22 IMAGING — CT CT HEAD W/O CM
4 series · 16 of 47 positions shown, 18 images · non-contrast
Comparison: Head CT, CTA, and MRI [DATE]

CLINICAL DATA: Stroke follow-up.

EXAM:
CT HEAD WITHOUT CONTRAST
TECHNIQUE: Contiguous axial images were obtained from the base of the skull
through the vertex without intravenous contrast.

[Series 3: head without · axial · non-contrast · 0.45mm/px · z∈[-63,+72]mm · 7 of 37 slices shown, 9 images]
[im 5/37  brain]
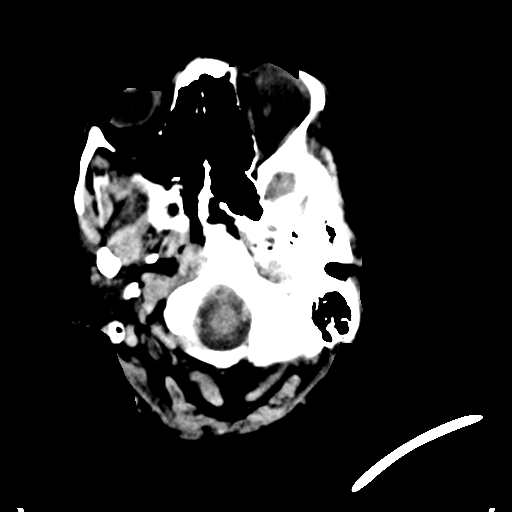
[im 5/37  bone]
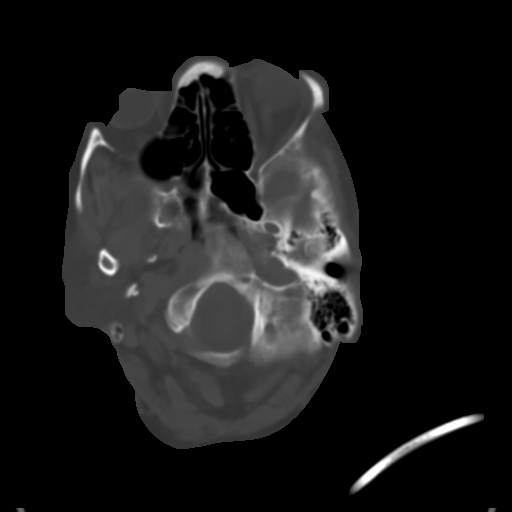
[im 10/37  brain]
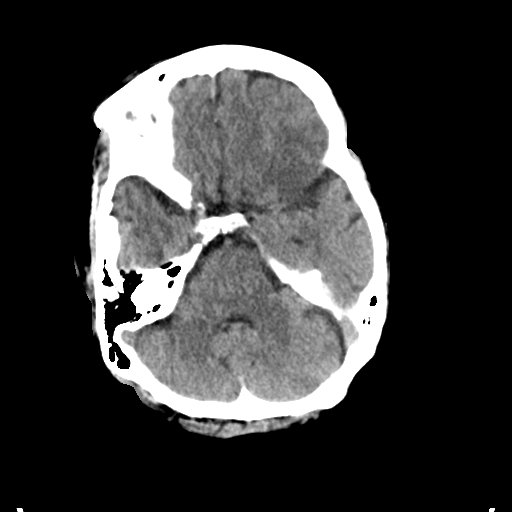
[im 14/37  brain]
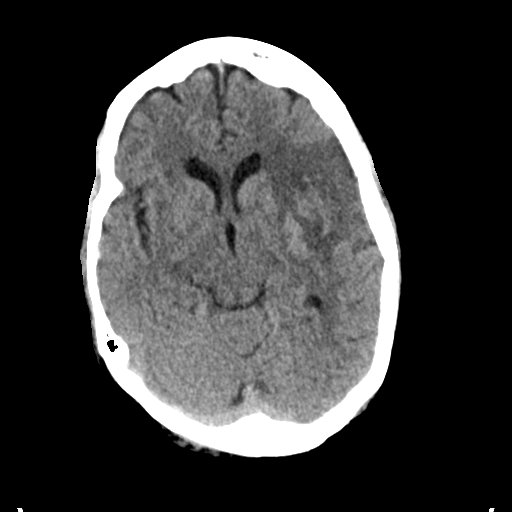
[im 19/37  brain]
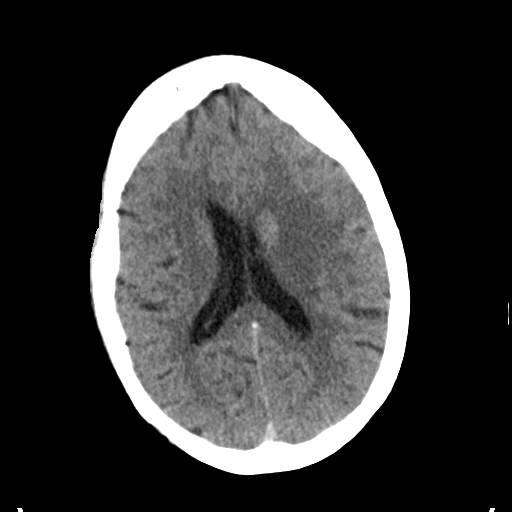
[im 23/37  brain]
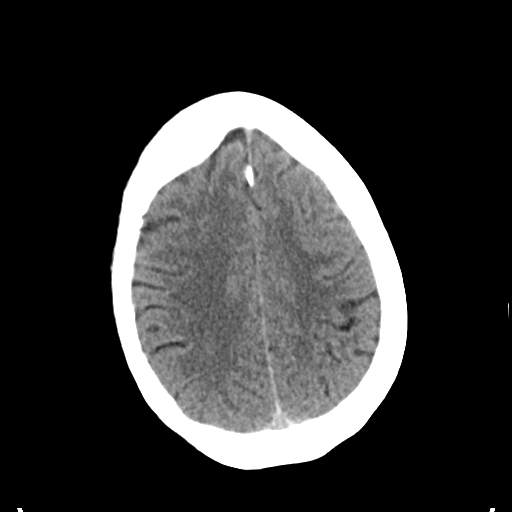
[im 23/37  bone]
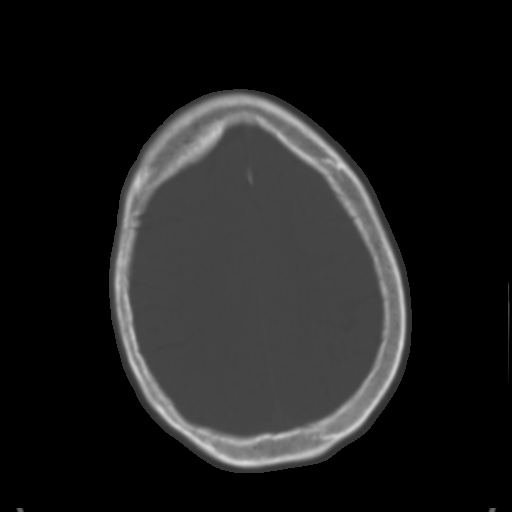
[im 28/37  brain]
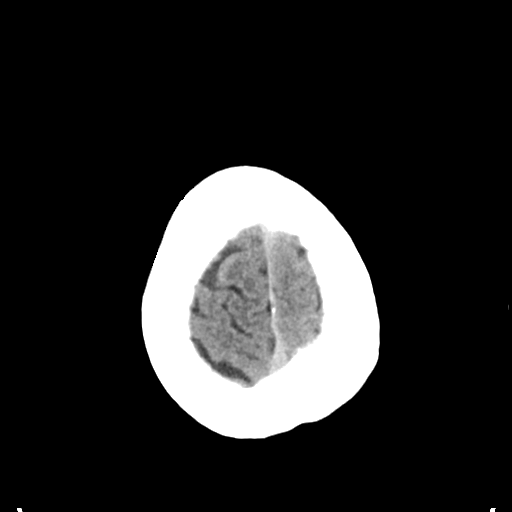
[im 32/37  brain]
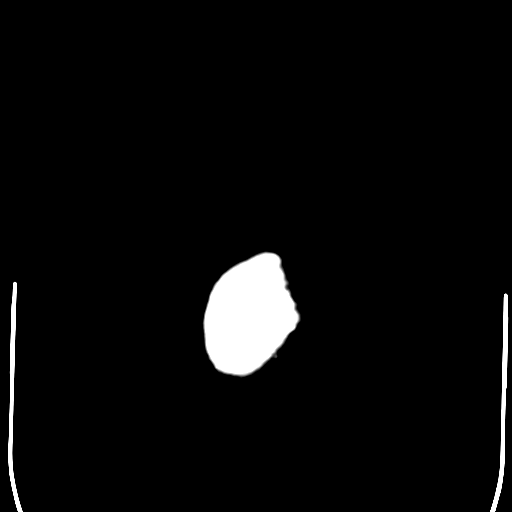

[Series 4: head bone · axial · 0.45mm/px · z∈[-65,-29]mm · 3 of 91 slices shown]
[im 10/91  bone]
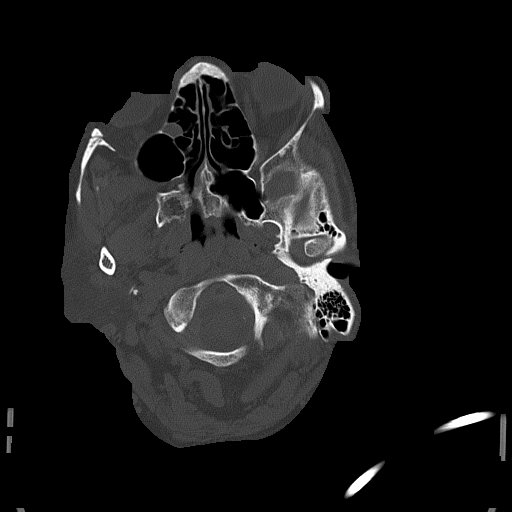
[im 19/91  bone]
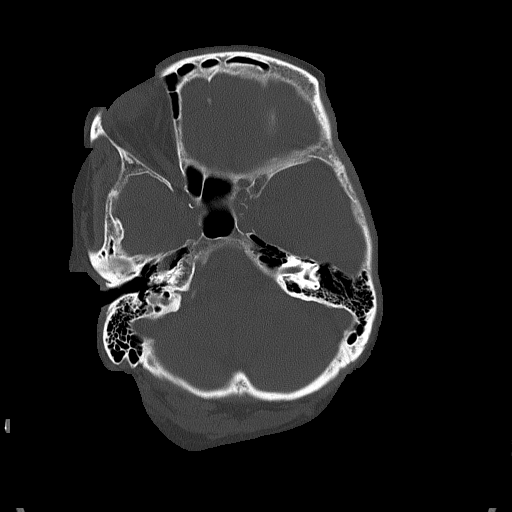
[im 28/91  bone]
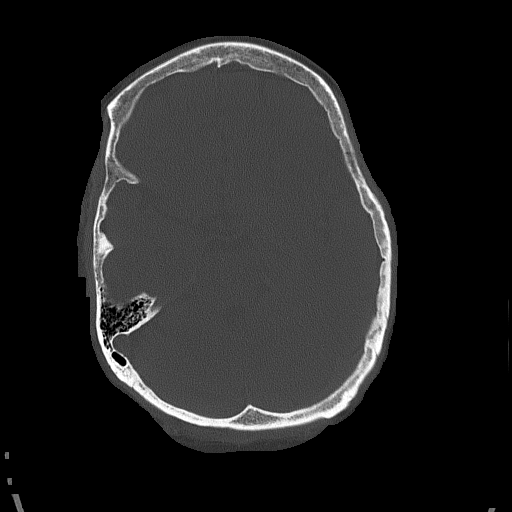

[Series 5: head without cor · coronal · non-contrast · 0.35mm/px · 3 of 73 slices shown]
[im 25/73  brain]
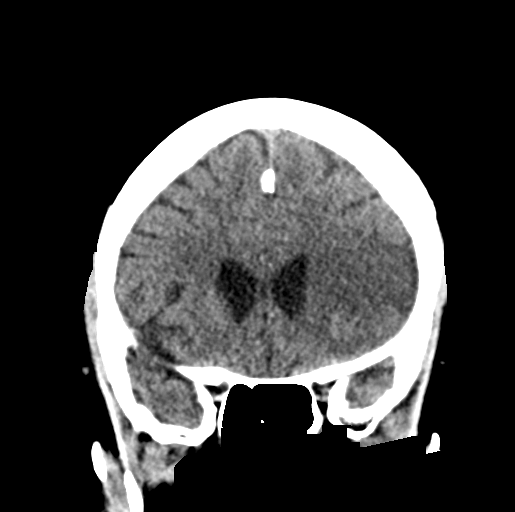
[im 33/73  brain]
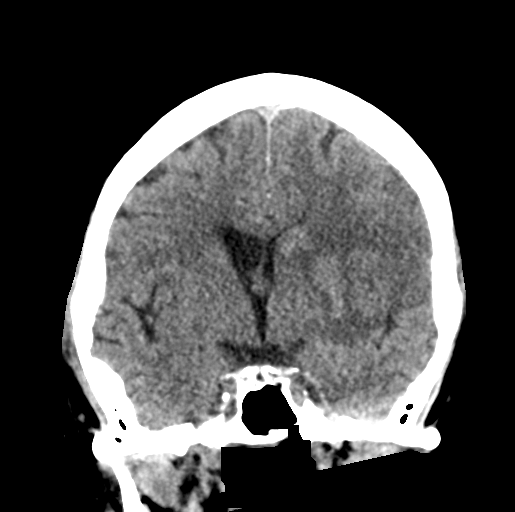
[im 41/73  brain]
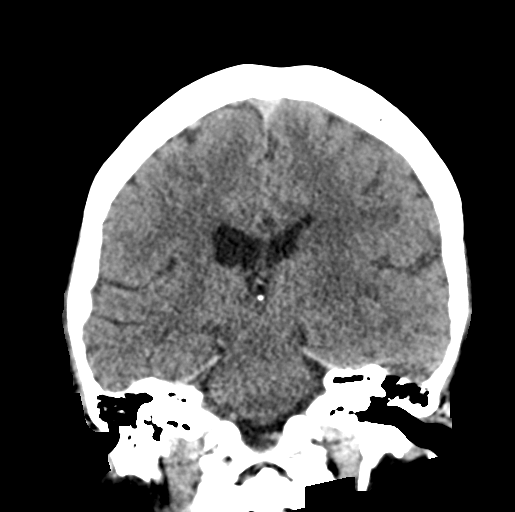

[Series 6: head without sag · sagittal · non-contrast · 0.35mm/px · 3 of 52 slices shown]
[im 22/52  brain]
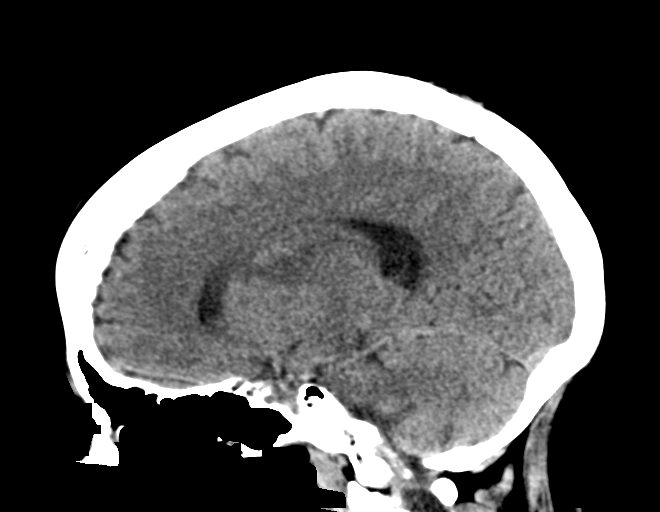
[im 26/52  brain]
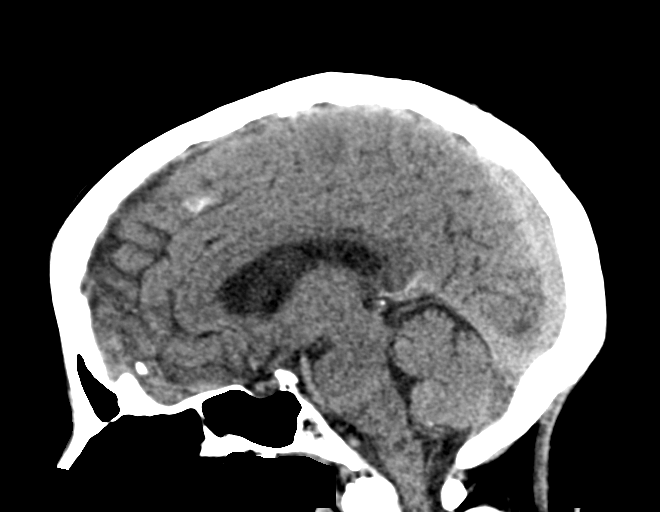
[im 30/52  brain]
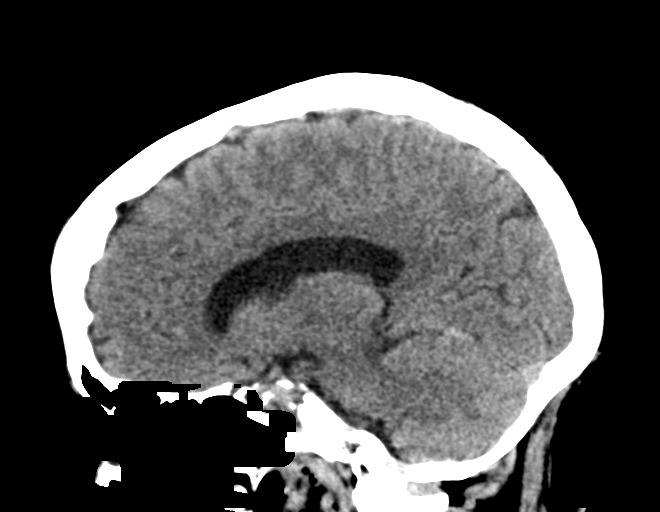

[16 of 47 positions shown; findings below may reference images not displayed]

FINDINGS: Brain: An evolving moderately large acute left MCA infarct is
similar in extent to the prior MRI with increasing cytotoxic edema.
There is partial effacement of the left lateral ventricle with at
most trace midline shift. Multiple small areas of hemorrhage within
the infarct are similar to the MRI. There is no extra-axial fluid
collection. There is a background of mild chronic small vessel
ischemia in the cerebral white matter bilaterally.

Vascular: Calcified atherosclerosis at the skull base. Hyperdense
left MCA branch vessel corresponding to the M2 occlusion on CTA.

Skull: No fracture or suspicious osseous lesion.

Sinuses/Orbits: Opacification of a single right ethmoid air cell.
Clear mastoid air cells. Unremarkable orbits.

Other: None.
IMPRESSION: Evolving left MCA infarct with unchanged small areas of hemorrhage.

## 2020-04-22 MED ORDER — ATORVASTATIN CALCIUM 40 MG PO TABS
40.0000 mg | ORAL_TABLET | Freq: Every day | ORAL | Status: DC
  Administered 2020-04-22 – 2020-04-29 (×8): 40 mg
  Filled 2020-04-22: qty 4
  Filled 2020-04-22 (×6): qty 1

## 2020-04-22 MED ORDER — DIGOXIN 0.25 MG/ML IJ SOLN
0.2500 mg | Freq: Four times a day (QID) | INTRAMUSCULAR | Status: AC
  Administered 2020-04-22 (×2): 0.25 mg via INTRAVENOUS
  Filled 2020-04-22 (×2): qty 2

## 2020-04-22 MED ORDER — METOPROLOL TARTRATE 25 MG PO TABS: 25.0000 mg | ORAL_TABLET | Freq: Two times a day (BID) | ORAL | Status: DC

## 2020-04-22 MED ORDER — ENOXAPARIN SODIUM 40 MG/0.4ML ~~LOC~~ SOLN
40.0000 mg | SUBCUTANEOUS | Status: DC
  Administered 2020-04-22 – 2020-04-30 (×9): 40 mg via SUBCUTANEOUS
  Filled 2020-04-22 (×9): qty 0.4

## 2020-04-22 MED ORDER — ASPIRIN 300 MG RE SUPP
300.0000 mg | Freq: Every day | RECTAL | Status: DC
  Administered 2020-04-22: 300 mg via RECTAL
  Filled 2020-04-22 (×5): qty 1

## 2020-04-22 MED ORDER — METOPROLOL TARTRATE 25 MG PO TABS
25.0000 mg | ORAL_TABLET | Freq: Two times a day (BID) | ORAL | Status: DC
  Administered 2020-04-22 – 2020-04-26 (×8): 25 mg
  Filled 2020-04-22 (×8): qty 1

## 2020-04-22 MED ORDER — ASPIRIN 81 MG PO CHEW
81.0000 mg | CHEWABLE_TABLET | Freq: Every day | ORAL | Status: DC
  Administered 2020-04-23 – 2020-04-30 (×8): 81 mg
  Filled 2020-04-22 (×8): qty 1

## 2020-04-22 MED ORDER — OSMOLITE 1.2 CAL PO LIQD
1000.0000 mL | ORAL | Status: DC
Start: 2020-04-22 — End: 2020-04-22

## 2020-04-22 MED ORDER — OSMOLITE 1.5 CAL PO LIQD
1000.0000 mL | ORAL | Status: DC
  Administered 2020-04-22 – 2020-04-27 (×8): 1000 mL
  Filled 2020-04-22 (×8): qty 1000

## 2020-04-22 MED ORDER — PROSOURCE TF PO LIQD
45.0000 mL | Freq: Two times a day (BID) | ORAL | Status: DC
  Administered 2020-04-22 – 2020-04-28 (×12): 45 mL
  Filled 2020-04-22 (×15): qty 45

## 2020-04-22 NOTE — TOC Initial Note (Signed)
Transition of Care [facility]) - Initial/Assessment Note    Patient Details  Name: Lindsey [name] MRN: [id number] Date of Birth: Apr 01, 1956  [facility] CM/SW Contact:    Lawerance Sabal, RN Phone Number: 04/22/2020, 10:57 AM  Clinical Narrative:        Patient admitted from home w daughter. Patient suffered CVA with devistating right side deficits.  Spoke w daughter at bedside.  She confirms patient is uninsured. PCP is Jeralyn Ruths in [place]. Patient gets medications either at [facility], [facility] or [facility]. Minimal HH services available to uninsured patient. Patient will need SNF or CIR for maximum benefit.  TOC will continue to follow.              Expected Discharge Plan: Skilled Nursing Facility Barriers to Discharge: Continued Medical Work up, Inadequate or no insurance   Patient Goals and CMS Choice        Expected Discharge Plan and Services Expected Discharge Plan: Skilled Nursing Facility                                              Prior Living Arrangements/Services                       Activities of Daily Living      Permission Sought/Granted                  Emotional Assessment              Admission diagnosis:  CVA (cerebral vascular accident) [facility]) [I63.9] Cerebrovascular accident (CVA) due to embolism of cerebral artery (HCC) [I63.40] Patient Active Problem List   Diagnosis Date Noted  . CVA (cerebral vascular accident) (HCC) 04/21/2020  . Unspecified atrial fibrillation (HCC) 04/21/2020  . Essential hypertension 04/21/2020  . Alcohol abuse 04/21/2020   PCP:  Emogene Morgan, MD Pharmacy:   [facility] [address], [place] - [address] [place] [address] Phone: [phone] Fax: [phone]     Social Determinants of Health (SDOH) Interventions    Readmission Risk Interventions No flowsheet data found.

## 2020-04-22 NOTE — Consult Note (Signed)
Physical Medicine and Rehabilitation Consult  Reason for Consult: Stroke with functional deficits.  Referring Physician: Dr. Carolin Coy   HPI: Lindsey [name] is a 64 y.o. female with history of HTN and alcohol use who was admitted on 04/21/20 with right sided weakness and slurred speech.  Last seen normal the evening before and noted to have slurred speech that am contributed to intoxicated state and admitted later in evening. She was found to have dysarthria, aphasia, left gaze preference and right hemiparesis. She was in Afib with RVR at admission and UDS positive for THC.  CT head showed large L-MCA infarct and CTA revealed left M2 occlusion but patient out of window for thrombolysis. Follow up MRI brain later that day showed acute L-MCA infarct with mild hemorrhage not seen on CT earlier.  2D echo showed EF 60-65% with mild LVH, mild RA dilation and no wall abnormality. BLE dopplers negative for DVT.   Follow up CT head showed evolving infract with increase in cytotoxic edema and partial effacement of left lateral ventricle and stable areas of hemorrhage.  Stroke felt to be embolic in setting of A fib and ASA added on 07/21. Cardiology consulted to help manage tachycardia and IV metoprolol added for rate control. Swallow eval showed moderate to severe oral dysphagia due to motor planning deficits and NPO recommended.  Therapy evaluations completed revealing left gaze preference with inability to scan to the right, right inattention, right sided weakness with sensory deficits and emerging tone. CIR recommended due to functional decline.     Daughter and grandson in room- pt working with PT - first time for this PT-  Was NPO, but diet just updated to D1 puree with nectar thick liquids-  Per PT, pt is already pushing and also has spasticity in RLE noted.   Review of Systems  Unable to perform ROS: Patient nonverbal      Past Medical History:  Diagnosis Date  . Diverticulosis   .  Hypertension   . Uterine prolapse     History reviewed. No pertinent surgical history.    Family History  Problem Relation Age of Onset  . Breast cancer Neg Hx     Social History:  Lives with family. reports that she quit smoking about 41 years ago. Her smoking use included cigarettes. She smoked 0.25 packs per day. She has never used smokeless tobacco. She reports current alcohol use. She reports that she does not use drugs.    Allergies: No Known Allergies    Medications Prior to Admission  Medication Sig Dispense Refill  . amLODipine (NORVASC) 10 MG tablet Take 10 mg by mouth daily.    Marland Kitchen atenolol (TENORMIN) 25 MG tablet Take 25 mg by mouth daily.    . diazepam (VALIUM) 2 MG tablet Take 1 tablet (2 mg total) by mouth every 8 (eight) hours as needed. (Patient taking differently: Take 2 mg by mouth every 8 (eight) hours as needed for muscle spasms. ) 12 tablet 0  . diphenhydrAMINE (BENADRYL) 25 MG tablet Take 25 mg by mouth every 6 (six) hours as needed for itching.     . hydrOXYzine (ATARAX/VISTARIL) 25 MG tablet Take 25 mg by mouth 3 (three) times daily as needed for itching.     . lidocaine (LIDODERM) 5 % Place 1 patch onto the skin daily. Remove & Discard patch within 12 hours or as directed by MD    . naproxen (NAPROSYN) 500 MG tablet Take 1 tablet (500 mg total)  by mouth 2 (two) times daily with a meal. 30 tablet 0  . oxyCODONE-acetaminophen (PERCOCET) 5-325 MG tablet Take 1 tablet by mouth every 6 (six) hours as needed for severe pain. 20 tablet 0  . cetirizine (ZYRTEC) 10 MG tablet Take 1 tablet (10 mg total) by mouth daily for 7 days. 7 tablet 0  . hydrOXYzine (ATARAX/VISTARIL) 25 MG tablet Take 1 tablet (25 mg total) by mouth 3 (three) times daily as needed. (Patient not taking: Reported on 04/21/2020) 30 tablet 0  . sulfamethoxazole-trimethoprim (BACTRIM DS,SEPTRA DS) 800-160 MG tablet Take 1 tablet by mouth 2 (two) times daily. (Patient not taking: Reported on 04/21/2020) 20  tablet 0    Home: Home Living Family/patient expects to be discharged to:: Private residence Living Arrangements: Children Available Help at Discharge: Family, Available PRN/intermittently (daughter works nights, 5pm-1:30.May have other familyto help) Type of Home: House Home Access: Level entry Home Layout: Multi-level, Able to live on main level with bedroom/bathroom, Other (Comment), 1/2 bath on main level (currently on second floor but can move) Alternate Level Stairs-Number of Steps: flight Bathroom Shower/Tub: Walk-in shower (upstairs, only half bath downstairs) FirefighterBathroom Toilet: Standard Home Equipment: None  Lives With: Daughter  Functional History: Prior Function Level of Independence: Independent Comments: driving Functional Status:  Mobility: Bed Mobility Overal bed mobility: Needs Assistance Bed Mobility: Rolling, Sidelying to Sit, Sit to Supine Rolling: Mod assist, Max assist Sidelying to sit: Max assist, +2 for physical assistance, +2 for safety/equipment, HOB elevated Sit to supine: Max assist, +2 for safety/equipment, +2 for physical assistance, HOB elevated General bed mobility comments: Patient able to initiate movements with L UE.  Rolling better to R side. Requiring total assist with movement or R UE/LE Transfers General transfer comment: not assessed this day      ADL: ADL Overall ADL's : Needs assistance/impaired Functional mobility during ADLs: +2 for physical assistance, Maximal assistance, Total assistance General ADL Comments: Patient total assist for all ADLs at this time due to poor command following, motor planning, and R side weakness.  Cognition: Cognition Overall Cognitive Status: Difficult to assess Arousal/Alertness: Awake/alert Orientation Level: Other (comment) (gross dysarthria and aphasia) Memory:  (did not test due to pt's language deficits) Cognition Arousal/Alertness: Awake/alert, Lethargic (periods of lethargy, improved with  sitting EOB) Behavior During Therapy: WFL for tasks assessed/performed Overall Cognitive Status: Difficult to assess Area of Impairment: Following commands, Safety/judgement, Awareness, Problem solving Following Commands: Follows one step commands inconsistently, Follows one step commands with increased time Safety/Judgement: Decreased awareness of deficits, Decreased awareness of safety Awareness: Intellectual Problem Solving: Slow processing, Difficulty sequencing, Decreased initiation, Requires verbal cues, Requires tactile cues General Comments: Patient with significant R side neglect and poor motor planning.  Not able to imitate movements (asked to give thumbs up and did more of a waving motion with digits 2-5 moving up and down).  Patient able to follow some simple commands.  Answering yes/no, attempting other words but speech very dysarthric/slurred. Difficult to assess due to: Impaired communication   Blood pressure (!) 139/96, pulse (!) 114, temperature 98 F (36.7 C), temperature source Oral, resp. rate 18, weight 75.8 kg, SpO2 100 %. Physical Exam Vitals and nursing note reviewed.  Constitutional:      Appearance: Normal appearance.     Comments: [place] in nares. Fatigued appearing.   Sitting up with 2 PT's assistance, daughter and grandson also in room, NAD  HENT:     Head: Normocephalic and atraumatic.     Comments: Severe R  facial droop Tongue deviated R     Right Ear: External ear normal.     Left Ear: External ear normal.     Nose: Nose normal. No congestion or rhinorrhea.     Mouth/Throat:     Mouth: Mucous membranes are dry.     Pharynx: Oropharynx is clear. No oropharyngeal exudate.  Eyes:     Comments: L gaze preference- unable to cross midline to look R- unable to see nystagmus  Neck:     Comments: Kept head slightly tilted Cardiovascular:     Rate and Rhythm: Tachycardia present. Rhythm irregular.     Comments: Heart rate up to 130's with minimal  activity  Also up into 120s-130s with sitting /scotting to EOB with PT; irregular rhythm Pulmonary:     Comments: CTA B/L- no W/R/R- good air movement- occ juicy cough, but clear when listens Abdominal:     Comments: Soft, protuberant, ND, NT, hypoactive BS  Musculoskeletal:     Cervical back: Normal range of motion.     Comments: Very apraxic on LUE/LLE Held RUE limply- 0/5 RLE- was with R hip externally rotated and foot supinated slightly- even though strength 0/5 in RLE  Skin:    Comments: No skin breakdown seen  Neurological:     Mental Status: She is alert.     Comments: Right facial weakness. Aphonic and globally aphasic. Tends to nod yes to all questions--she was able to recognise her name with choice of two. She thought that she was at home despite multiple cues. Unable to point to items.  Apraxic- able to move LUE and LLE with verbal and tactile cues. Does not attend to right and right sided weakness noted. She was unable to move eye to right field.  Apraxic on L Per pt whispering/dyarthria severe- hard to understand- said ~ 10 words total- pt said sensation to light touch in RUE/RLE was nml- not sure if it is or not.  MAS of 1+ in R hip and R ankle and knee- very early for tone No clonus/equivocial hoffman's RUE  Psychiatric:     Comments: Flat- said ~ 10 words- dysarthric- hard to gauge     Results for orders placed or performed during the hospital encounter of 04/21/20 (from the past 24 hour(s))  SARS Coronavirus 2 by RT PCR (hospital order, performed in [facility] hospital lab) Nasopharyngeal Nasopharyngeal Swab     Status: None   Collection Time: 04/21/20  6:51 PM   Specimen: Nasopharyngeal Swab  Result Value Ref Range   SARS Coronavirus 2 NEGATIVE NEGATIVE  Urine rapid drug screen (hosp performed)     Status: Abnormal   Collection Time: 04/21/20  8:00 PM  Result Value Ref Range   Opiates NONE DETECTED NONE DETECTED   Cocaine NONE DETECTED NONE DETECTED    Benzodiazepines NONE DETECTED NONE DETECTED   Amphetamines NONE DETECTED NONE DETECTED   Tetrahydrocannabinol POSITIVE (A) NONE DETECTED   Barbiturates NONE DETECTED NONE DETECTED  Urinalysis, Routine w reflex microscopic     Status: Abnormal   Collection Time: 04/21/20  8:00 PM  Result Value Ref Range   Color, Urine YELLOW YELLOW   APPearance CLEAR CLEAR   Specific Gravity, Urine 1.042 (H) 1.005 - 1.030   pH 6.0 5.0 - 8.0   Glucose, UA NEGATIVE NEGATIVE mg/dL   Hgb urine dipstick NEGATIVE NEGATIVE   Bilirubin Urine NEGATIVE NEGATIVE   Ketones, ur 20 (A) NEGATIVE mg/dL   Protein, ur NEGATIVE NEGATIVE mg/dL  Nitrite NEGATIVE NEGATIVE   Leukocytes,Ua NEGATIVE NEGATIVE  Culture, blood (Routine X 2) w Reflex to ID Panel     Status: None (Preliminary result)   Collection Time: 04/21/20  8:25 PM   Specimen: BLOOD RIGHT HAND  Result Value Ref Range   Specimen Description BLOOD RIGHT HAND    Special Requests      BOTTLES DRAWN AEROBIC AND ANAEROBIC Blood Culture adequate volume   Culture      NO GROWTH < 12 HOURS Performed at [facility], [address] [place], [place] [address]    Report Status PENDING   HIV Antibody (routine testing w rflx)     Status: None   Collection Time: 04/21/20 11:33 PM  Result Value Ref Range   HIV Screen 4th Generation wRfx Non Reactive Non Reactive  MRSA PCR Screening     Status: None   Collection Time: 04/22/20  1:17 AM   Specimen: Nasal Mucosa; Nasopharyngeal  Result Value Ref Range   MRSA by PCR NEGATIVE NEGATIVE  Hemoglobin A1c     Status: Abnormal   Collection Time: 04/22/20  3:38 AM  Result Value Ref Range   Hgb A1c MFr Bld 6.1 (H) 4.8 - 5.6 %   Mean Plasma Glucose 128.37 mg/dL  Lipid panel     Status: Abnormal   Collection Time: 04/22/20  3:38 AM  Result Value Ref Range   Cholesterol 203 (H) 0 - 200 mg/dL   Triglycerides 69 <149 mg/dL   HDL 62 >70 mg/dL   Total CHOL/HDL Ratio 3.3 RATIO   VLDL 14 0 - 40 mg/dL   LDL Cholesterol  263 (H) 0 - 99 mg/dL   CT Angio Head W or Wo Contrast  Result Date: 04/21/2020 CLINICAL DATA:  Stroke.  Slurred speech EXAM: CT ANGIOGRAPHY HEAD AND NECK TECHNIQUE: Multidetector CT imaging of the head and neck was performed using the standard protocol during bolus administration of intravenous contrast. Multiplanar CT image reconstructions and MIPs were obtained to evaluate the vascular anatomy. Carotid stenosis measurements (when applicable) are obtained utilizing NASCET criteria, using the distal internal carotid diameter as the denominator. CONTRAST:  62mL OMNIPAQUE IOHEXOL 350 MG/ML SOLN COMPARISON:  CT head 04/21/2020 FINDINGS: CTA NECK FINDINGS Aortic arch: Mild atherosclerotic calcification aortic arch. Bovine branching pattern. Proximal great vessels patent. Right carotid system: Right carotid widely patent without stenosis. Minimal atherosclerotic calcification right carotid bulb. Medial deviation of the carotid bifurcation and internal carotid artery posterior to the pharynx. Left carotid system: Left carotid widely patent with mild atherosclerotic calcification at the bifurcation. Medial deviation of the carotid bifurcation internal carotid artery in the retropharyngeal space. Vertebral arteries: Left vertebral artery dominant. Left vertebral artery patent to the basilar without stenosis. Small right vertebral artery without stenosis. Skeleton: No acute skeletal abnormality. Other neck: Negative for mass or adenopathy in the neck. Upper chest: Lung apices clear bilaterally. Superior mediastinum negative. Review of the MIP images confirms the above findings CTA HEAD FINDINGS Anterior circulation: Internal carotid artery widely patent bilaterally. Left M1 segment widely patent. There is occlusion of the proximal left M2 inferior division which appears acute. This corresponds to low-density on CT compatible with acute infarct. Superior division left MCA patent Both anterior cerebral arteries widely  patent. Right middle cerebral artery and branches widely patent. Posterior circulation: Both vertebral arteries patent to the basilar. PICA patent bilaterally. Basilar widely patent. Superior cerebellar and posterior cerebral arteries patent bilaterally without stenosis or occlusion. Venous sinuses: Early contrast enhancement of the  dural sinuses not well evaluated. Anatomic variants: None Review of the MIP images confirms the above findings IMPRESSION: 1. 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 Acute occlusion inferior division left M2  . 3. No other intracranial stenosis 4. No extracranial stenosis in the carotid or vertebral arteries. 5. These results were called by telephone at the time of interpretation on 04/21/2020 at 6:59 pm to provider JOSHUA LONG , who verbally acknowledged these results. Electronically Signed   By: Marlan Palau M.D.   On: 04/21/2020 19:00   CT HEAD WO CONTRAST  Result Date: 04/22/2020 CLINICAL DATA:  Stroke follow-up. EXAM: CT HEAD WITHOUT CONTRAST TECHNIQUE: Contiguous axial images were obtained from the base of the skull through the vertex without intravenous contrast. COMPARISON:  Head CT, CTA, and MRI 04/21/2020 FINDINGS: Brain: An evolving moderately large acute left MCA infarct is similar in extent to the prior MRI with increasing cytotoxic edema. There is partial effacement of the left lateral ventricle with at most trace midline shift. Multiple small areas of hemorrhage within the infarct are similar to the MRI. There is no extra-axial fluid collection. There is a background of mild chronic small vessel ischemia in the cerebral white matter bilaterally. Vascular: Calcified atherosclerosis at the skull base. Hyperdense left MCA branch vessel corresponding to the M2 occlusion on CTA. Skull: No fracture or suspicious osseous lesion. Sinuses/Orbits: Opacification of a single right ethmoid air cell. Clear mastoid air cells. Unremarkable orbits. Other: None. IMPRESSION: Evolving  left MCA infarct with unchanged small areas of hemorrhage. Electronically Signed   By: Sebastian Ache M.D.   On: 04/22/2020 07:47   CT Head Wo Contrast  Result Date: 04/21/2020 CLINICAL DATA:  Stroke suspected, slurred speech, right-sided weakness and facial droop EXAM: CT HEAD WITHOUT CONTRAST TECHNIQUE: Contiguous axial images were obtained from the base of the skull through the vertex without intravenous contrast. COMPARISON:  None. FINDINGS: Brain: There is a hypodensity of the anterior left MCA territory centered about the insula and frontal operculum, involving the left basal ganglia, particularly the putamen and internal capsule, as well as the high left MCA territory cortex. There is no involvement of the temporal lobe. No evidence of hemorrhage. There is mild mass effect on the left lateral ventricle with approximately 3 mm left right midline shift. Vascular: Subtly hyperdense M2 segmental vessel in the left insula (series 2, image 12). Skull: Normal. Negative for fracture or focal lesion. Sinuses/Orbits: No acute finding. Other: None. IMPRESSION: 1. There is a large hypodensity of the anterior left MCA territory, findings consistent with acute to subacute left MCA territory infarction. ASPECTS 4. 2.  No evidence of hemorrhage. 3. There is mild mass effect on the left lateral ventricle with approximately 3 mm left right midline shift. Findings reported by telephone to Dr. Jacqulyn Bath at the time of interpretation, 6:48 p.m., 04/21/2020 Electronically Signed   By: Lauralyn Primes M.D.   On: 04/21/2020 18:53   CT Angio Neck W and/or Wo Contrast  Result Date: 04/21/2020 CLINICAL DATA:  Stroke.  Slurred speech EXAM: CT ANGIOGRAPHY HEAD AND NECK TECHNIQUE: Multidetector CT imaging of the head and neck was performed using the standard protocol during bolus administration of intravenous contrast. Multiplanar CT image reconstructions and MIPs were obtained to evaluate the vascular anatomy. Carotid stenosis measurements  (when applicable) are obtained utilizing NASCET criteria, using the distal internal carotid diameter as the denominator. CONTRAST:  62mL OMNIPAQUE IOHEXOL 350 MG/ML SOLN COMPARISON:  CT head 04/21/2020 FINDINGS: CTA NECK FINDINGS Aortic  arch: Mild atherosclerotic calcification aortic arch. Bovine branching pattern. Proximal great vessels patent. Right carotid system: Right carotid widely patent without stenosis. Minimal atherosclerotic calcification right carotid bulb. Medial deviation of the carotid bifurcation and internal carotid artery posterior to the pharynx. Left carotid system: Left carotid widely patent with mild atherosclerotic calcification at the bifurcation. Medial deviation of the carotid bifurcation internal carotid artery in the retropharyngeal space. Vertebral arteries: Left vertebral artery dominant. Left vertebral artery patent to the basilar without stenosis. Small right vertebral artery without stenosis. Skeleton: No acute skeletal abnormality. Other neck: Negative for mass or adenopathy in the neck. Upper chest: Lung apices clear bilaterally. Superior mediastinum negative. Review of the MIP images confirms the above findings CTA HEAD FINDINGS Anterior circulation: Internal carotid artery widely patent bilaterally. Left M1 segment widely patent. There is occlusion of the proximal left M2 inferior division which appears acute. This corresponds to low-density on CT compatible with acute infarct. Superior division left MCA patent Both anterior cerebral arteries widely patent. Right middle cerebral artery and branches widely patent. Posterior circulation: Both vertebral arteries patent to the basilar. PICA patent bilaterally. Basilar widely patent. Superior cerebellar and posterior cerebral arteries patent bilaterally without stenosis or occlusion. Venous sinuses: Early contrast enhancement of the dural sinuses not well evaluated. Anatomic variants: None Review of the MIP images confirms the above  findings IMPRESSION: 1. 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 Acute occlusion inferior division left M2  . 3. No other intracranial stenosis 4. No extracranial stenosis in the carotid or vertebral arteries. 5. These results were called by telephone at the time of interpretation on 04/21/2020 at 6:59 pm to provider JOSHUA LONG , who verbally acknowledged these results. Electronically Signed   By: Marlan Palau M.D.   On: 04/21/2020 19:00   MR BRAIN WO CONTRAST  Result Date: 04/21/2020 CLINICAL DATA:  Stroke slurred speech EXAM: MRI HEAD WITHOUT CONTRAST TECHNIQUE: Multiplanar, multiecho pulse sequences of the brain and surrounding structures were obtained without intravenous contrast. COMPARISON:  CT angio head neck 05/01/2020 FINDINGS: Brain: Acute infarct left MCA territory. Infarct involves the left frontal lobe, insula as well as the caudate and putamen. There are areas of hemorrhage within the caudate and putamen as well as in the left frontal lobe. These are not identified on CT. Scattered small white matter hyperintensities elsewhere compatible with chronic microvascular ischemia. Ventricle size normal. Negative for mass lesion. Vascular: Normal arterial flow voids Skull and upper cervical spine: No focal skeletal lesion Sinuses/Orbits: Negative Other: None IMPRESSION: Acute infarct left MCA territory there is mild hemorrhage in the infarct which is not seen on recent CT earlier today. These results were called by telephone at the time of interpretation on 04/21/2020 at 9:52 pm to provider CHING TU , who verbally acknowledged these results. Electronically Signed   By: Marlan Palau M.D.   On: 04/21/2020 21:53   ECHOCARDIOGRAM COMPLETE  Result Date: 04/22/2020    ECHOCARDIOGRAM REPORT   Patient Name:   Guerry Bruin Date of Exam: 04/22/2020 Medical Rec #:  [id number]     Height:       65.0 in Accession #:    [id number]    Weight:       167.1 lb Date of Birth:  02-May-1956     BSA:          1.833 m  Patient Age:    64 years      BP:  115/88 mmHg Patient Gender: F             HR:           114 bpm. Exam Location:  Inpatient Procedure: 2D Echo, Cardiac Doppler and Color Doppler Indications:    Stroke  History:        Patient has no prior history of Echocardiogram examinations.                 Stroke, Arrythmias:Atrial Fibrillation; Risk                 Factors:Hypertension. ETOH.  Sonographer:    Sheralyn Boatman RDCS Referring Phys: [id number] CHING T TU  Sonographer Comments: Technically difficult study due to poor echo windows. Poor sound wave transmission. IMPRESSIONS  1. Left ventricular ejection fraction, by estimation, is 60 to 65%. The left ventricle has normal function. The left ventricle has no regional wall motion abnormalities. There is mild left ventricular hypertrophy. Left ventricular diastolic parameters are indeterminate.  2. Right ventricular systolic function is normal. The right ventricular size is normal. There is normal pulmonary artery systolic pressure. The estimated right ventricular systolic pressure is 27.6 mmHg.  3. Left atrial size was mild to moderately dilated.  4. Right atrial size was mildly dilated.  5. The mitral valve is normal in structure. No evidence of mitral valve regurgitation. No evidence of mitral stenosis.  6. The aortic valve is tricuspid. Aortic valve regurgitation is not visualized. No aortic stenosis is present.  7. The inferior vena cava is normal in size with greater than 50% respiratory variability, suggesting right atrial pressure of 3 mmHg. FINDINGS  Left Ventricle: Left ventricular ejection fraction, by estimation, is 60 to 65%. The left ventricle has normal function. The left ventricle has no regional wall motion abnormalities. The left ventricular internal cavity size was normal in size. There is  mild left ventricular hypertrophy. Left ventricular diastolic parameters are indeterminate. Right Ventricle: The right ventricular size is normal. No increase in  right ventricular wall thickness. Right ventricular systolic function is normal. There is normal pulmonary artery systolic pressure. The tricuspid regurgitant velocity is 2.48 m/s, and  with an assumed right atrial pressure of 3 mmHg, the estimated right ventricular systolic pressure is 27.6 mmHg. Left Atrium: Left atrial size was mild to moderately dilated. Right Atrium: Right atrial size was mildly dilated. Pericardium: Trivial pericardial effusion is present. Mitral Valve: The mitral valve is normal in structure. There is mild calcification of the mitral valve leaflet(s). Mild mitral annular calcification. No evidence of mitral valve regurgitation. No evidence of mitral valve stenosis. Tricuspid Valve: The tricuspid valve is normal in structure. Tricuspid valve regurgitation is trivial. Aortic Valve: The aortic valve is tricuspid. Aortic valve regurgitation is not visualized. No aortic stenosis is present. Pulmonic Valve: The pulmonic valve was normal in structure. Pulmonic valve regurgitation is not visualized. Aorta: The aortic root is normal in size and structure. Venous: The inferior vena cava is normal in size with greater than 50% respiratory variability, suggesting right atrial pressure of 3 mmHg. IAS/Shunts: No atrial level shunt detected by color flow Doppler.  LEFT VENTRICLE PLAX 2D LVIDd:         3.85 cm LVIDs:         2.73 cm LV PW:         0.89 cm LV IVS:        1.40 cm LVOT diam:     1.70 cm LV SV:  38 LV SV Index:   21 LVOT Area:     2.27 cm  LV Volumes (MOD) LV vol d, MOD A2C: 52.1 ml LV vol d, MOD A4C: 48.9 ml LV vol s, MOD A2C: 21.7 ml LV vol s, MOD A4C: 17.7 ml LV SV MOD A2C:     30.4 ml LV SV MOD A4C:     48.9 ml LV SV MOD BP:      30.4 ml RIGHT VENTRICLE         IVC TAPSE (M-mode): 1.4 cm  IVC diam: 1.73 cm LEFT ATRIUM             Index       RIGHT ATRIUM           Index LA diam:        3.60 cm 1.96 cm/m  RA Area:     17.90 cm LA Vol (A2C):   75.4 ml 41.14 ml/m RA Volume:   53.70  ml  29.30 ml/m LA Vol (A4C):   53.8 ml 29.36 ml/m LA Biplane Vol: 64.1 ml 34.98 ml/m  AORTIC VALVE LVOT Vmax:   102.00 cm/s LVOT Vmean:  62.200 cm/s LVOT VTI:    0.168 m  AORTA Ao Root diam: 3.10 cm Ao Asc diam:  3.20 cm MITRAL VALVE                TRICUSPID VALVE MV Area (PHT): 4.28 cm     TR Peak grad:   24.6 mmHg MV Decel Time: 177 msec     TR Vmax:        248.00 cm/s MV E velocity: 108.67 cm/s                             SHUNTS                             Systemic VTI:  0.17 m                             Systemic Diam: 1.70 cm Marca Ancona MD Electronically signed by Marca Ancona MD Signature Date/Time: 04/22/2020/4:34:41 PM    Final    VAS [place] LOWER EXTREMITY VENOUS (DVT)  Result Date: 04/22/2020  Lower Venous DVTStudy Indications: Stroke.  Comparison Study: no prior Performing Technologist: [name] RVS  Examination Guidelines: A complete evaluation includes B-mode imaging, spectral Doppler, color Doppler, and power Doppler as needed of all accessible portions of each vessel. Bilateral testing is considered an integral part of a complete examination. Limited examinations for reoccurring indications may be performed as noted. The reflux portion of the exam is performed with the patient in reverse Trendelenburg.  +---------+---------------+---------+-----------+----------+--------------+ RIGHT    CompressibilityPhasicitySpontaneityPropertiesThrombus Aging +---------+---------------+---------+-----------+----------+--------------+ CFV      Full           Yes      Yes                                 +---------+---------------+---------+-----------+----------+--------------+ SFJ      Full                                                        +---------+---------------+---------+-----------+----------+--------------+  FV Prox  Full                                                        +---------+---------------+---------+-----------+----------+--------------+ FV Mid   Full                                                         +---------+---------------+---------+-----------+----------+--------------+ FV DistalFull                                                        +---------+---------------+---------+-----------+----------+--------------+ PFV      Full                                                        +---------+---------------+---------+-----------+----------+--------------+ POP      Full           Yes      Yes                                 +---------+---------------+---------+-----------+----------+--------------+ PTV      Full                                                        +---------+---------------+---------+-----------+----------+--------------+ PERO                                                  Not visualized +---------+---------------+---------+-----------+----------+--------------+   +---------+---------------+---------+-----------+----------+--------------+ LEFT     CompressibilityPhasicitySpontaneityPropertiesThrombus Aging +---------+---------------+---------+-----------+----------+--------------+ CFV      Full           Yes      Yes                                 +---------+---------------+---------+-----------+----------+--------------+ SFJ      Full                                                        +---------+---------------+---------+-----------+----------+--------------+ FV Prox  Full                                                        +---------+---------------+---------+-----------+----------+--------------+  FV Mid   Full                                                        +---------+---------------+---------+-----------+----------+--------------+ FV DistalFull                                                        +---------+---------------+---------+-----------+----------+--------------+ PFV      Full                                                         +---------+---------------+---------+-----------+----------+--------------+ POP      Full           Yes      Yes                                 +---------+---------------+---------+-----------+----------+--------------+ PTV      Full                                                        +---------+---------------+---------+-----------+----------+--------------+ PERO                                                  Not visualized +---------+---------------+---------+-----------+----------+--------------+     Summary: BILATERAL: - No evidence of deep vein thrombosis seen in the lower extremities, bilaterally. - No evidence of superficial venous thrombosis in the lower extremities, bilaterally. -No evidence of popliteal cyst, bilaterally.   *See table(s) above for measurements and observations.    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  1. Does the need for close, 24 hr/day medical supervision in concert with the patient's rehab needs make it unreasonable for this patient to be served in a less intensive setting? Yes 2. Co-Morbidities requiring supervision/potential complications: alcohol abuse, HTN, R hemiplegia, A fib with RVR, dysphagia/aphasia, neglect, is pusher 3. Due to bladder management, bowel management, safety, skin/wound care, disease management, medication administration and patient education, does the patient require 24 hr/day rehab nursing? Yes 4. Does the patient require coordinated care of a physician, rehab nurse, therapy disciplines of PT, OT and SLP to address physical and functional deficits in the context of the above medical diagnosis(es)? Yes Addressing deficits in the following areas: balance, endurance, locomotion, strength, transferring, bowel/bladder control, bathing, dressing, feeding, grooming, toileting,  cognition, speech, language and swallowing 5. Can the patient actively participate in an intensive therapy program of at least 3 hrs of therapy per day at least 5 days per week? Yes 6. The potential for  patient to make measurable gains while on inpatient rehab is fair and poor 7. Anticipated functional outcomes upon discharge from inpatient rehab are min assist and mod assist  with PT, min assist and mod assist with OT, min assist with SLP. 8. Estimated rehab length of stay to reach the above functional goals is: 25-30 days possibly 9. Anticipated discharge destination: Home 10. Overall Rehab/Functional Prognosis: fair and poor  RECOMMENDATIONS: This patient's condition is appropriate for continued rehabilitative care in the following setting: CIR Patient has agreed to participate in recommended program. Potentially Note that insurance prior authorization may be required for reimbursement for recommended care.  Comment:  1. Pt unable to consent at this time- daughter and grandson in room- grandson lives in [place]- not clear on daughter and if they can provide 24 hours assistance.  2. If family can provide 24 hr assistance, pt is then appropriate for inpt rehab/CIR- however it's noted she has no insurance this would not stop CIR admission, but make SNF placement very difficult if family cannot care for her.  3. Pt has severe dysphagia, dysarthria, early spasticity, R hemiplegia, R neglect/inattention, L gaze preference, and was pushing during therapy- these all signal a less positive outcome - possibly- spent 20 minutes discussing with grandson, bluntly, that pt will need 24 hour assistance for the foreseeable future- if she's unable to come to CIR for any reason, will need to see PM&R in future for function and spasticity management- please give contact info if needed- [phone].  4. Suggest Amantadine after 1 week for speech recovery- and initiation. 100 mg daily x 1 week then 200 mg daily.  5.  Will submit/have admission coordinators assess pt for possible CIR admission- 6. Needs to have heart rate addressed more aggressively before coming to CIR, please! Going up to 120s-130s with minimal activity 7. Thank you for this consult   Jacquelynn Cree, PA-C 04/22/2020   I have personally performed a face to face diagnostic evaluation of this patient and formulated the key components of the plan.  Additionally, I have personally reviewed laboratory data, imaging studies, as well as relevant notes and concur with the physician assistant's documentation above.  I spent a total of 80 minutes on consult- 20 minutes reviewing chart, 25 minutes in room with pt/interview/exam; 15 minutes speaking with grandson; and 20 minutes doing/typing consult.

## 2020-04-22 NOTE — Progress Notes (Signed)
  Echocardiogram 2D Echocardiogram has been performed.  Janalyn Harder 04/22/2020, 11:08 AM

## 2020-04-22 NOTE — Progress Notes (Signed)
Rehab Admissions Coordinator Note:  Patient was screened by Clois Dupes for appropriateness for an Inpatient Acute Rehab Consult per therapy recommendations. .  At this time, we are recommending Inpatient Rehab consult. I will  place order per protocol.  Clois Dupes RN MSN 04/22/2020, 3:48 PM  I can be reached at [phone].

## 2020-04-22 NOTE — Progress Notes (Signed)
   04/22/20 0100  Assess: MEWS Score  Temp 98.3 F (36.8 C)  BP (!) 138/93  Pulse Rate (!) 143  ECG Heart Rate (!) 134  Resp 20  Level of Consciousness Alert  SpO2 99 %  O2 Device Room Air  Patient Activity (if Appropriate) In bed  Assess: MEWS Score  MEWS Temp 0  MEWS Systolic 0  MEWS Pulse 3  MEWS RR 0  MEWS LOC 0  MEWS Score 3  MEWS Score Color Yellow  Assess: if the MEWS score is Yellow or Red  Were vital signs taken at a resting state? Yes  Focused Assessment  (initial assessment done at this time at admission)  Early Detection of Sepsis Score *See Row Information* Low  MEWS guidelines implemented *See Row Information* Yes  Treat  MEWS Interventions Escalated (See documentation below)  Pain Scale Faces  Faces Pain Scale 0  Take Vital Signs  Increase Vital Sign Frequency  Yellow: Q 2hr X 2 then Q 4hr X 2, if remains yellow, continue Q 4hrs (Pt is having q2 hour vital sign for stroke)  Escalate  MEWS: Escalate Yellow: discuss with charge nurse/RN and consider discussing with provider and RRT  Notify: Charge Nurse/RN  Name of Charge Nurse/RN Notified Oley Balm RN  Date Charge Nurse/RN Notified 04/22/20  Time Charge Nurse/RN Notified 0115  Notify: Provider  Provider Name/Title Shalhoub, MD  Date Provider Notified 04/22/20  Time Provider Notified [phone]  Notification Type Page  Notification Reason Other (Comment) (notify patient of yellow MEWS and high HR)  Response No new orders  Date of Provider Response 04/22/20  Time of Provider Response 0100  Document  Patient Outcome Other (Comment) (Remains on dept with elevated HR--no interventions implement)  Progress note created (see row info) Yes    Since coming to the ED and being admitted to 5W30, patient's HR has been ranging from 110-130's with Afib rhythm. Due to patient's elevated HR, patient's MEWS fired yellow with a score of 3. Notified Shalhoub,MD of patient's HR. MD returned page and stated just continue to  monitor the patient's HR at this time and that if the patient's HR began to sustain in the 140's-150's, re-page him for further interventions. Patient is resting and sleeping in the bed at this time. Will continue to monitor and treat per MD orders.

## 2020-04-22 NOTE — Consult Note (Addendum)
Cardiology Consultation:   Patient ID: Lindsey [name]; [id number]; 1956/01/01   Admit date: 04/21/2020 Date of Consult: 04/22/2020  Primary Care Provider: Emogene Morgan, MD Primary Cardiologist: New to [facility]   Patient Profile:   Lindsey [name] is a 64 y.o. female with a hx of HTN, alcohol/tobacco use and acute CVA (this admission) who is being seen today for the evaluation of new onset atrial fibrillation at the request of Dr. Jerral Ralph.   History of Present Illness:   Lindsey [name] is a 64yo F with a hx as stated above who presented to [facility] on 04/21/20 with right sided weakness and slurred speech found to have acute left MCA territory infarct with mild left to right midline shift 3 mm. Follow up brain MRI found to have hemorrhagic conversion of the stroke in multiple places to her left hemisphere>>case discussed with IM and Dr. Amada Jupiter with recommendations to hold antiplatelets and DVT prophylaxis  HPI obtained through chart review and patients daughter who is at bedise as the patient remains dysarthric and aphasic and unable to provide history. She was last known to be normal approximately 4:30pm 04/20/2020 by her daughter. The following morning the daughter noticed that she was not out of bed on her arrival and had slurred speech when she checked on her. The daughter assumed that she was intoxicated. After several hours, the daughter called EMS out of concern that something was wrong. On my exam, the patient nods appropriately to yes or no questioning and denies prior palpitations or chest pain. Unclear how long the patient has been in AF given no symptoms.   On ED arrival, the patient was dysarthric and aphasic with left sided gaze and right sided hemiparesis. EKG/telemetry showed new atrial fibrillation with RVR with rates intermittently in the 140 range. Head CT showed acute to subacute left MCA territorial infarct with mild mass-effect on the left lateral ventricle with 3 mm left-to-right  midline shift, also with an acute occlusion to the left M2 on CTA head and neck.  Past Medical History:  Diagnosis Date  . Hypertension     History reviewed. No pertinent surgical history.   Prior to Admission medications   Medication Sig Start Date End Date Taking? Authorizing Provider  amLODipine (NORVASC) 10 MG tablet Take 10 mg by mouth daily.   Yes [provider]  atenolol (TENORMIN) 25 MG tablet Take 25 mg by mouth daily.   Yes [provider]  diazepam (VALIUM) 2 MG tablet Take 1 tablet (2 mg total) by mouth every 8 (eight) hours as needed. Patient taking differently: Take 2 mg by mouth every 8 (eight) hours as needed for muscle spasms.  11/23/16  Yes Triplett, Cari B, FNP  diphenhydrAMINE (BENADRYL) 25 MG tablet Take 25 mg by mouth every 6 (six) hours as needed for itching.    Yes [provider]  hydrOXYzine (ATARAX/VISTARIL) 25 MG tablet Take 25 mg by mouth 3 (three) times daily as needed for itching.    Yes [provider]  lidocaine (LIDODERM) 5 % Place 1 patch onto the skin daily. Remove & Discard patch within 12 hours or as directed by MD   Yes [provider]  naproxen (NAPROSYN) 500 MG tablet Take 1 tablet (500 mg total) by mouth 2 (two) times daily with a meal. 11/23/16  Yes Triplett, Cari B, FNP  oxyCODONE-acetaminophen (PERCOCET) 5-325 MG tablet Take 1 tablet by mouth every 6 (six) hours as needed for severe pain. 05/02/19  Yes Katrinka Blazing,  Arther Abbott, PA-C  cetirizine (ZYRTEC) 10 MG tablet Take 1 tablet (10 mg total) by mouth daily for 7 days. 02/23/19 03/02/19  Nita Sickle, MD  hydrOXYzine (ATARAX/VISTARIL) 25 MG tablet Take 1 tablet (25 mg total) by mouth 3 (three) times daily as needed. Patient not taking: Reported on 04/21/2020 05/02/19   Joni Reining, PA-C  sulfamethoxazole-trimethoprim (BACTRIM DS,SEPTRA DS) 800-160 MG tablet Take 1 tablet by mouth 2 (two) times daily. Patient not taking: Reported on 04/21/2020 11/23/16    Chinita Pester, FNP    Inpatient Medications: Scheduled Meds: .  stroke: mapping our early stages of recovery book   Does not apply Once  . aspirin  300 mg Rectal Daily   Or  . aspirin  81 mg Per Tube Daily  . atorvastatin  40 mg Oral q1800  . digoxin  0.25 mg Intravenous Q6H  . enoxaparin (LOVENOX) injection  40 mg Subcutaneous Q24H   Continuous Infusions:  PRN Meds: acetaminophen **OR** acetaminophen (TYLENOL) oral liquid 160 mg/5 mL **OR** acetaminophen  Allergies:   No Known Allergies  Social History:   Social History   Socioeconomic History  . Marital status: Single    Spouse name: Not on file  . Number of children: Not on file  . Years of education: Not on file  . Highest education level: Not on file  Occupational History  . Not on file  Tobacco Use  . Smoking status: Former Smoker    Packs/day: 0.25    Types: Cigarettes    Quit date: 10/27/1978    Years since quitting: 41.5  . Smokeless tobacco: Never Used  Substance and Sexual Activity  . Alcohol use: Yes    Alcohol/week: 0.0 standard drinks    Comment: states once a wine/beer occassionally   . Drug use: No  . Sexual activity: Never  Other Topics Concern  . Not on file  Social History Narrative  . Not on file   Social Determinants of Health   Financial Resource Strain:   . Difficulty of Paying Living Expenses:   Food Insecurity:   . Worried About Programme researcher, broadcasting/film/video in the Last Year:   . Barista in the Last Year:   Transportation Needs:   . Freight forwarder (Medical):   Marland Kitchen Lack of Transportation (Non-Medical):   Physical Activity:   . Days of Exercise per Week:   . Minutes of Exercise per Session:   Stress:   . Feeling of Stress :   Social Connections:   . Frequency of Communication with Friends and Family:   . Frequency of Social Gatherings with Friends and Family:   . Attends Religious Services:   . Active Member of Clubs or Organizations:   . Attends Banker  Meetings:   Marland Kitchen Marital Status:   Intimate Partner Violence:   . Fear of Current or Ex-Partner:   . Emotionally Abused:   Marland Kitchen Physically Abused:   . Sexually Abused:     Family History:   Family History  Problem Relation Age of Onset  . Breast cancer Neg Hx    Family Status:  Family Status  Relation Name Status  . Neg Hx  (Not Specified)    ROS:  Please see the history of present illness.  All other ROS reviewed and negative.     Physical Exam/Data:   Vitals:   04/22/20 0620 04/22/20 0737 04/22/20 0824 04/22/20 1215  BP: 135/87 (!) 126/93 115/88 126/77  Pulse: Marland Kitchen)  128 (!) 127 (!) 114   Resp: 17 (!) 21 19 (!) 25  Temp: 98.2 F (36.8 C) 97.8 F (36.6 C) 97.8 F (36.6 C) 97.8 F (36.6 C)  TempSrc: Oral Oral Oral Oral  SpO2: 98% 95% 100%   Weight:        Intake/Output Summary (Last 24 hours) at 04/22/2020 1520 Last data filed at 04/22/2020 [id number] Gross per 24 hour  Intake --  Output 100 ml  Net -100 ml   Filed Weights   04/22/20 0100  Weight: 75.8 kg   Body mass index is 27.81 kg/m.   General: Well developed, well nourished, NAD Neck: Negative for carotid bruits. No JVD Lungs:Clear to ausculation bilaterally. No wheezes, rales, or rhonchi. Breathing is unlabored. Cardiovascular: Irregularly irregular with S1 S2. No murmurs Abdomen: Soft, non-tender, non-distended. No obvious abdominal masses. Extremities: No edema. Radial pulses 2+ bilaterally Neuro: Right sided facial asymmetry. Right sided focal upper and lower deficits.  Psych: Appropriately nods to questioning   EKG:  The EKG was personally reviewed and demonstrates: 04/21/20 AF with HR 108bpm  Telemetry:  Telemetry was personally reviewed and demonstrates: 04/22/20 AF with HRs in the 120-140 range   Relevant CV Studies:  Echocardiogram: Pending results   Laboratory Data:  Chemistry Recent Labs  Lab 04/21/20 1749  NA 137  K 4.0  CL 102  CO2 19*  GLUCOSE 128*  BUN 7*  CREATININE 0.70  CALCIUM  9.2  GFRNONAA >60  GFRAA >60  ANIONGAP 16*    Total Protein  Date Value Ref Range Status  04/21/2020 8.4 (H) 6.5 - 8.1 g/dL Final  95/62/130811/15/2015 7.7 6.4 - 8.2 g/dL Final   Albumin  Date Value Ref Range Status  04/21/2020 3.8 3.5 - 5.0 g/dL Final  65/78/469611/15/2015 3.3 (L) 3.4 - 5.0 g/dL Final   AST  Date Value Ref Range Status  04/21/2020 56 (H) 15 - 41 U/L Final   SGOT(AST)  Date Value Ref Range Status  08/17/2014 122 (H) 15 - 37 Unit/L Final   ALT  Date Value Ref Range Status  04/21/2020 38 0 - 44 U/L Final   SGPT (ALT)  Date Value Ref Range Status  08/17/2014 183 (H) U/L Final    Comment:    14-63 NOTE: New Reference Range 04/22/14    Alkaline Phosphatase  Date Value Ref Range Status  04/21/2020 79 38 - 126 U/L Final  08/17/2014 107 Unit/L Final    Comment:    46-116 NOTE: New Reference Range 04/22/14    Total Bilirubin  Date Value Ref Range Status  04/21/2020 1.4 (H) 0.3 - 1.2 mg/dL Final   Bilirubin,Total  Date Value Ref Range Status  08/17/2014 0.4 0.2 - 1.0 mg/dL Final   Hematology Recent Labs  Lab 04/21/20 1749  WBC 5.1  RBC 4.39  HGB 13.0  HCT 39.0  MCV 88.8  MCH 29.6  MCHC 33.3  RDW 14.8  PLT 305   Cardiac EnzymesNo results for input(s): TROPONINI in the last 168 hours. No results for input(s): TROPIPOC in the last 168 hours.  BNPNo results for input(s): BNP, PROBNP in the last 168 hours.  DDimer No results for input(s): DDIMER in the last 168 hours. TSH: No results found for: TSH Lipids: Lab Results  Component Value Date   CHOL 203 (H) 04/22/2020   HDL 62 04/22/2020   LDLCALC 127 (H) 04/22/2020   TRIG 69 04/22/2020   CHOLHDL 3.3 04/22/2020   HgbA1c: Lab Results  Component Value Date  HGBA1C 6.1 (H) 04/22/2020    Radiology/Studies:  CT Angio Head W or Wo Contrast  Result Date: 04/21/2020 CLINICAL DATA:  Stroke.  Slurred speech EXAM: CT ANGIOGRAPHY HEAD AND NECK TECHNIQUE: Multidetector CT imaging of the head and neck was  performed using the standard protocol during bolus administration of intravenous contrast. Multiplanar CT image reconstructions and MIPs were obtained to evaluate the vascular anatomy. Carotid stenosis measurements (when applicable) are obtained utilizing NASCET criteria, using the distal internal carotid diameter as the denominator. CONTRAST:  75mL OMNIPAQUE IOHEXOL 350 MG/ML SOLN COMPARISON:  CT head 04/21/2020 FINDINGS: CTA NECK FINDINGS Aortic arch: Mild atherosclerotic calcification aortic arch. Bovine branching pattern. Proximal great vessels patent. Right carotid system: Right carotid widely patent without stenosis. Minimal atherosclerotic calcification right carotid bulb. Medial deviation of the carotid bifurcation and internal carotid artery posterior to the pharynx. Left carotid system: Left carotid widely patent with mild atherosclerotic calcification at the bifurcation. Medial deviation of the carotid bifurcation internal carotid artery in the retropharyngeal space. Vertebral arteries: Left vertebral artery dominant. Left vertebral artery patent to the basilar without stenosis. Small right vertebral artery without stenosis. Skeleton: No acute skeletal abnormality. Other neck: Negative for mass or adenopathy in the neck. Upper chest: Lung apices clear bilaterally. Superior mediastinum negative. Review of the MIP images confirms the above findings CTA HEAD FINDINGS Anterior circulation: Internal carotid artery widely patent bilaterally. Left M1 segment widely patent. There is occlusion of the proximal left M2 inferior division which appears acute. This corresponds to low-density on CT compatible with acute infarct. Superior division left MCA patent Both anterior cerebral arteries widely patent. Right middle cerebral artery and branches widely patent. Posterior circulation: Both vertebral arteries patent to the basilar. PICA patent bilaterally. Basilar widely patent. Superior cerebellar and posterior  cerebral arteries patent bilaterally without stenosis or occlusion. Venous sinuses: Early contrast enhancement of the dural sinuses not well evaluated. Anatomic variants: None Review of the MIP images confirms the above findings IMPRESSION: 1. 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 Acute occlusion inferior division left M2  . 3. No other intracranial stenosis 4. No extracranial stenosis in the carotid or vertebral arteries. 5. These results were called by telephone at the time of interpretation on 04/21/2020 at 6:59 pm to provider JOSHUA LONG , who verbally acknowledged these results. Electronically Signed   By: Marlan Palau M.D.   On: 04/21/2020 19:00   CT HEAD WO CONTRAST  Result Date: 04/22/2020 CLINICAL DATA:  Stroke follow-up. EXAM: CT HEAD WITHOUT CONTRAST TECHNIQUE: Contiguous axial images were obtained from the base of the skull through the vertex without intravenous contrast. COMPARISON:  Head CT, CTA, and MRI 04/21/2020 FINDINGS: Brain: An evolving moderately large acute left MCA infarct is similar in extent to the prior MRI with increasing cytotoxic edema. There is partial effacement of the left lateral ventricle with at most trace midline shift. Multiple small areas of hemorrhage within the infarct are similar to the MRI. There is no extra-axial fluid collection. There is a background of mild chronic small vessel ischemia in the cerebral white matter bilaterally. Vascular: Calcified atherosclerosis at the skull base. Hyperdense left MCA branch vessel corresponding to the M2 occlusion on CTA. Skull: No fracture or suspicious osseous lesion. Sinuses/Orbits: Opacification of a single right ethmoid air cell. Clear mastoid air cells. Unremarkable orbits. Other: None. IMPRESSION: Evolving left MCA infarct with unchanged small areas of hemorrhage. Electronically Signed   By: Sebastian Ache M.D.   On: 04/22/2020 07:47  CT Head Wo Contrast  Result Date: 04/21/2020 CLINICAL DATA:  Stroke suspected,  slurred speech, right-sided weakness and facial droop EXAM: CT HEAD WITHOUT CONTRAST TECHNIQUE: Contiguous axial images were obtained from the base of the skull through the vertex without intravenous contrast. COMPARISON:  None. FINDINGS: Brain: There is a hypodensity of the anterior left MCA territory centered about the insula and frontal operculum, involving the left basal ganglia, particularly the putamen and internal capsule, as well as the high left MCA territory cortex. There is no involvement of the temporal lobe. No evidence of hemorrhage. There is mild mass effect on the left lateral ventricle with approximately 3 mm left right midline shift. Vascular: Subtly hyperdense M2 segmental vessel in the left insula (series 2, image 12). Skull: Normal. Negative for fracture or focal lesion. Sinuses/Orbits: No acute finding. Other: None. IMPRESSION: 1. There is a large hypodensity of the anterior left MCA territory, findings consistent with acute to subacute left MCA territory infarction. ASPECTS 4. 2.  No evidence of hemorrhage. 3. There is mild mass effect on the left lateral ventricle with approximately 3 mm left right midline shift. Findings reported by telephone to Dr. Jacqulyn Bath at the time of interpretation, 6:48 p.m., 04/21/2020 Electronically Signed   By: Lauralyn Primes M.D.   On: 04/21/2020 18:53   CT Angio Neck W and/or Wo Contrast  Result Date: 04/21/2020 CLINICAL DATA:  Stroke.  Slurred speech EXAM: CT ANGIOGRAPHY HEAD AND NECK TECHNIQUE: Multidetector CT imaging of the head and neck was performed using the standard protocol during bolus administration of intravenous contrast. Multiplanar CT image reconstructions and MIPs were obtained to evaluate the vascular anatomy. Carotid stenosis measurements (when applicable) are obtained utilizing NASCET criteria, using the distal internal carotid diameter as the denominator. CONTRAST:  75mL OMNIPAQUE IOHEXOL 350 MG/ML SOLN COMPARISON:  CT head 04/21/2020 FINDINGS:  CTA NECK FINDINGS Aortic arch: Mild atherosclerotic calcification aortic arch. Bovine branching pattern. Proximal great vessels patent. Right carotid system: Right carotid widely patent without stenosis. Minimal atherosclerotic calcification right carotid bulb. Medial deviation of the carotid bifurcation and internal carotid artery posterior to the pharynx. Left carotid system: Left carotid widely patent with mild atherosclerotic calcification at the bifurcation. Medial deviation of the carotid bifurcation internal carotid artery in the retropharyngeal space. Vertebral arteries: Left vertebral artery dominant. Left vertebral artery patent to the basilar without stenosis. Small right vertebral artery without stenosis. Skeleton: No acute skeletal abnormality. Other neck: Negative for mass or adenopathy in the neck. Upper chest: Lung apices clear bilaterally. Superior mediastinum negative. Review of the MIP images confirms the above findings CTA HEAD FINDINGS Anterior circulation: Internal carotid artery widely patent bilaterally. Left M1 segment widely patent. There is occlusion of the proximal left M2 inferior division which appears acute. This corresponds to low-density on CT compatible with acute infarct. Superior division left MCA patent Both anterior cerebral arteries widely patent. Right middle cerebral artery and branches widely patent. Posterior circulation: Both vertebral arteries patent to the basilar. PICA patent bilaterally. Basilar widely patent. Superior cerebellar and posterior cerebral arteries patent bilaterally without stenosis or occlusion. Venous sinuses: Early contrast enhancement of the dural sinuses not well evaluated. Anatomic variants: None Review of the MIP images confirms the above findings IMPRESSION: 1. 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 Acute occlusion inferior division left M2  . 3. No other intracranial stenosis 4. No extracranial stenosis in the carotid or vertebral arteries. 5.  These results were called by telephone at the time of interpretation on  04/21/2020 at 6:59 pm to provider JOSHUA LONG , who verbally acknowledged these results. Electronically Signed   By: Marlan Palau M.D.   On: 04/21/2020 19:00   MR BRAIN WO CONTRAST  Result Date: 04/21/2020 CLINICAL DATA:  Stroke slurred speech EXAM: MRI HEAD WITHOUT CONTRAST TECHNIQUE: Multiplanar, multiecho pulse sequences of the brain and surrounding structures were obtained without intravenous contrast. COMPARISON:  CT angio head neck 05/01/2020 FINDINGS: Brain: Acute infarct left MCA territory. Infarct involves the left frontal lobe, insula as well as the caudate and putamen. There are areas of hemorrhage within the caudate and putamen as well as in the left frontal lobe. These are not identified on CT. Scattered small white matter hyperintensities elsewhere compatible with chronic microvascular ischemia. Ventricle size normal. Negative for mass lesion. Vascular: Normal arterial flow voids Skull and upper cervical spine: No focal skeletal lesion Sinuses/Orbits: Negative Other: None IMPRESSION: Acute infarct left MCA territory there is mild hemorrhage in the infarct which is not seen on recent CT earlier today. These results were called by telephone at the time of interpretation on 04/21/2020 at 9:52 pm to provider CHING TU , who verbally acknowledged these results. Electronically Signed   By: Marlan Palau M.D.   On: 04/21/2020 21:53   VAS [place] LOWER EXTREMITY VENOUS (DVT)  Result Date: 04/22/2020  Lower Venous DVTStudy Indications: Stroke.  Comparison Study: no prior Performing Technologist: [name] RVS  Examination Guidelines: A complete evaluation includes B-mode imaging, spectral Doppler, color Doppler, and power Doppler as needed of all accessible portions of each vessel. Bilateral testing is considered an integral part of a complete examination. Limited examinations for reoccurring indications may be performed as noted. The  reflux portion of the exam is performed with the patient in reverse Trendelenburg.  +---------+---------------+---------+-----------+----------+--------------+ RIGHT    CompressibilityPhasicitySpontaneityPropertiesThrombus Aging +---------+---------------+---------+-----------+----------+--------------+ CFV      Full           Yes      Yes                                 +---------+---------------+---------+-----------+----------+--------------+ SFJ      Full                                                        +---------+---------------+---------+-----------+----------+--------------+ FV Prox  Full                                                        +---------+---------------+---------+-----------+----------+--------------+ FV Mid   Full                                                        +---------+---------------+---------+-----------+----------+--------------+ FV DistalFull                                                        +---------+---------------+---------+-----------+----------+--------------+  PFV      Full                                                        +---------+---------------+---------+-----------+----------+--------------+ POP      Full           Yes      Yes                                 +---------+---------------+---------+-----------+----------+--------------+ PTV      Full                                                        +---------+---------------+---------+-----------+----------+--------------+ PERO                                                  Not visualized +---------+---------------+---------+-----------+----------+--------------+   +---------+---------------+---------+-----------+----------+--------------+ LEFT     CompressibilityPhasicitySpontaneityPropertiesThrombus Aging +---------+---------------+---------+-----------+----------+--------------+ CFV      Full           Yes       Yes                                 +---------+---------------+---------+-----------+----------+--------------+ SFJ      Full                                                        +---------+---------------+---------+-----------+----------+--------------+ FV Prox  Full                                                        +---------+---------------+---------+-----------+----------+--------------+ FV Mid   Full                                                        +---------+---------------+---------+-----------+----------+--------------+ FV DistalFull                                                        +---------+---------------+---------+-----------+----------+--------------+ PFV      Full                                                        +---------+---------------+---------+-----------+----------+--------------+  POP      Full           Yes      Yes                                 +---------+---------------+---------+-----------+----------+--------------+ PTV      Full                                                        +---------+---------------+---------+-----------+----------+--------------+ PERO                                                  Not visualized +---------+---------------+---------+-----------+----------+--------------+     Summary: BILATERAL: - No evidence of deep vein thrombosis seen in the lower extremities, bilaterally. - No evidence of superficial venous thrombosis in the lower extremities, bilaterally. -No evidence of popliteal cyst, bilaterally.   *See table(s) above for measurements and observations.    Preliminary     Assessment and Plan:   1. New onset atrial fibrillation: -New dx after presentation to [facility] 04/21/2020 with acute left MCA territory infarct with mild left to right midline shift 3 mm. Follow up brain MRI found to have hemorrhagic conversion of the stroke in multiple places to her left  hemisphere>>case discussed with IM and Dr. Amada Jupiter with recommendations to hold antiplatelets and DVT prophylaxis -Telemetry with rates in the 120-140 range -Difficult situation as she is not currently a candidate for anticoagulation given hemorraghic regions on follow up MRI. Dicussed in depth with daughter who was at bedside.  -Rates remain elevated however patient asymptomatic.  -Will need to confirm with neurology prior to starting beta blocker therapy as we are allowing permissive hypertension for now.  -If ok with primary and neurology, would start metoprolol IV for rate control. Not currently not a candidate for [facility] or D[facility] given hemorrhagic transformation in MRI  -Echocardiogram pending   -CHA2DS2VASc =at least 4 (female, HTN, stroke)  2. Acute left MCA territory infarct with mild left to right 21mm midline shift, also with hemorrhagic conversion: -Pt presented with right sided weakness and slurred speech found to have an acute left MCA territory infarct per heat CT. Subsequent brain MRI with hemorrhagic conversion of the stroke in multiple places to her left hemisphere. Given this antiplatelet and DVT prophylaxis therapy being held.  -Will need follow up head CT imagining to follow for progression -Started on atorvastatin  -Management per IM/neurology   3. HTN: -Stable, 126/77>115/88>126/93>135/87 -Allowing for permissive HTN  -Hold home antihypertensives -Once ok with primary and neurology teams, will need to start metoprolol for rate control   4. ETOH/Tobacco use: -Unclear use  -Plan to monitor for w/d symptoms  -Potential CIWA protocol   5. HLD: -LDL, 127 on 04/22/20 -On statin therapy   6. preDM2: -HbA1c, 6.1 today    For questions or updates, please contact [facility] Please consult [email or web address] for contact info under Cardiology/STEMI.   Raliegh Ip NP-C [facility] Pager: [phone] 04/22/2020 3:20 PM  Patient seen and examined with Georgie Chard NP-C.  Agree as above, with the following exceptions and changes as noted below. She is  a 64 yo female with atrial fibrillation and acute CVA with hemorrhagic conversion. Gen: NAD, CV: irregular rhythm, tachycardic, no murmurs, Lungs: clear, Abd: soft, Extrem: Warm, well perfused, no edema, Neuro/Psych: alert, right facial droop and right hemiparesis, dysarthric. All available labs, radiology testing, previous records reviewed. She is currently in afib, but just prior to my exam had an NG placed and received metoprolol by tube, 25 mg BID. This has helped her rate control, and rates are now 90-110 bpm. Continue metoprolol. She is also on digoxin. Ok to continue for now, though she may do well on metoprolol alone. Not currently candidate for anticoagulation however if approved by neurology she would warrant AC for afib.   Parke Poisson, MD 04/22/20 9:10 PM

## 2020-04-22 NOTE — Evaluation (Signed)
Clinical/Bedside Swallow Evaluation Patient Details  Name: Lindsey [name] MRN: [id number] Date of Birth: 07-24-1956  Today's Date: 04/22/2020 Time: SLP Start Time (ACUTE ONLY): 0913 SLP Stop Time (ACUTE ONLY): 0947 SLP Time Calculation (min) (ACUTE ONLY): 34 min  Past Medical History:  Past Medical History:  Diagnosis Date  . Hypertension    Past Surgical History: History reviewed. No pertinent surgical history. HPI:  According to MD notes, "Lindsey [name] is a 64 y.o. female with past medical history significant for hypertension presents emergency department with slurred speech and right-sided weakness noted by family today. Last seen normal by her daughter was 4:30 PM yesterday.  The daughter works evening shift and prior to her leaving patient was drinking.  This morning patient did not get up, the granddaughter noticed patient speech was gibberish but thought this was due to her being intoxicated.  Patient was in bed all day and could not getting words out and daughter finally decided to call EMS.  Patient did not arrive as a code stroke as she was outside the 24-hour window. On arrival patient had left gaze deviation, right-sided weakness and was aphasic and severely dysarthric following only some commands.  Stat CT head was obtained which showed large acute left MCA stroke, CTA confirmed a left M2 occlusion.  Unfortunately patient not a candidate for thrombectomy due to being outside the window and this is an established infarct."  The pt resides with her daughter and her daughter works.     Assessment / Plan / Recommendation Clinical Impression  Patient presents with clinical indications of moderately severe oral dysphagia due to her CVA impacting motor planning, hypoglossal, facial and trigeminal nerves at least.  She had significant right sided facial droop/weakness with inablity to seal her lips.  SLP provided oral care and po trials of small ice chip, 1 tsp nectar water and 1/4 tsp water  *thin.  Clinically pt with delayed swallow initiation *with motor planning deficits suspect primary oral dysphagia.   No indication of aspiration with minimal po provided however pt is lethargic and her aspiration risk is high.    Note concern for her ability to meet nutrition - however hopeful pt may be able to resume modified diet as she recovers from CVA.    At this time, due to her decreased mentation and level of dysphagia, recommend NPO x 1/4 tsp water when fully alert and fully upright - assuring to orally suction pt if she does not swallow.    Daughter present during session and quite tearful but able to follow instructions using teach back for oral care and participate in care plan.    Pt will likely benefit from instrumental swallow evaluation when indicated adequately evaluation of swallowing including sensation.    SLP Visit Diagnosis: Dysphagia, oral phase (R13.11)    Aspiration Risk  Mild aspiration risk;Risk for inadequate nutrition/hydration    Diet Recommendation Other (Comment) (1/4 tsp thin water after oral care when fully alert)   Medication Administration: Via alternative means Postural Changes: Seated upright at 90 degrees;Remain upright for at least 30 minutes after po intake    Other  Recommendations Oral Care Recommendations: Other (Comment) (at least TID)   Follow up Recommendations        Frequency and Duration min 2x/week  2 weeks       Prognosis Prognosis for Safe Diet Advancement: Fair Barriers to Reach Goals: Severity of deficits      Swallow Study   General Date of Onset: 04/22/20 HPI:  Lindsey [name] is a 64 y.o. female with past medical history significant for hypertension presents emergency department with slurred speech and right-sided weakness noted by family today. Last seen normal by her daughter was 4:30 PM yesterday.  The daughter works evening shift and prior to her leaving patient was drinking.  This morning patient did not get up, the  granddaughter noticed patient speech was gibberish but thought this was due to her being intoxicated.  Patient was in bed all day and could not getting words out and daughter finally decided to call EMS.  Patient did not arrive as a code stroke as she was outside the 24-hour window. On arrival patient had left gaze deviation, right-sided weakness and was aphasic and severely dysarthric following only some commands.  Stat CT head was obtained which showed large acute left MCA stroke, CTA confirmed a left M2 occlusion.  Unfortunately patient not a candidate for thrombectomy due to being outside the window and this is an established infarct. Type of Study: Bedside Swallow Evaluation Previous Swallow Assessment: none Diet Prior to this Study: NPO Temperature Spikes Noted: No Respiratory Status: Room air History of Recent Intubation: No Behavior/Cognition: Alert Oral Cavity Assessment: Dry Oral Care Completed by SLP: Yes Oral Cavity - Dentition: Adequate natural dentition Vision:  (pt has glasses, placed them for evaluation) Self-Feeding Abilities: Total assist Patient Positioning: Upright in bed Baseline Vocal Quality: Low vocal intensity Volitional Cough: Cognitively unable to elicit Volitional Swallow: Unable to elicit    Oral/Motor/Sensory Function Overall Oral Motor/Sensory Function: Severe impairment- MOTOR Planning Deficits also present Facial ROM: Reduced right Facial Symmetry: Abnormal symmetry right Facial Strength: Reduced right Facial Sensation: Reduced right Lingual ROM: Reduced right Lingual Symmetry: Abnormal symmetry right Lingual Strength: Reduced Lingual Sensation: Reduced Velum: Within Functional Limits Mandible: Within Functional Limits   Ice Chips Ice chips: Impaired Oral Phase Impairments: Reduced labial seal;Reduced lingual movement/coordination Oral Phase Functional Implications: Prolonged oral transit Pharyngeal Phase Impairments: Suspected delayed Swallow Other  Comments: clinically pt appears with delayed swallow initiation *with motor planning deficits suspect primary oral dysphagia   Thin Liquid Thin Liquid: Not tested    Nectar Thick Nectar Thick Liquid: Impaired Presentation: Spoon Oral Phase Impairments: Reduced labial seal;Reduced lingual movement/coordination Oral phase functional implications: Prolonged oral transit Pharyngeal Phase Impairments: Suspected delayed Swallow Other Comments: clinically pt appears with delayed swallow initiation *with motor planning deficits suspect primary oral dysphagia   Honey Thick Honey Thick Liquid: Not tested   Puree Puree: Not tested Other Comments: pt became sleepy - did not sustain attention and therefore SLP did not provide trial   Solid     Solid: Not tested      Chales Abrahams 04/22/2020,10:44 AM    Rolena Infante, MS [facility] SLP Acute Rehab Services Office [phone]

## 2020-04-22 NOTE — Procedures (Signed)
Cortrak  Person Inserting Tube:  Lindsey [name], RD Tube Type:  Cortrak - 43 inches Tube Location:  Right nare Initial Placement:  Stomach Secured by: Bridle Technique Used to Measure Tube Placement:  Documented cm marking at nare/ corner of mouth Cortrak Secured At:  61 cm    Cortrak Tube Team Note:  Consult received to place a Cortrak feeding tube.   No x-ray is required. RN may begin using tube.   If the tube becomes dislodged please keep the tube and contact the Cortrak team at [email or web address] (password [email or web address]) for replacement.  If after hours and replacement cannot be delayed, place a NG tube and confirm placement with an abdominal x-ray.    Cammy Copa., RD, LDN, CNSC See AMiON for contact information

## 2020-04-22 NOTE — Progress Notes (Signed)
Lower extremity venous has been completed.   Preliminary results in CV Proc.   [name] 04/22/2020 1:33 PM

## 2020-04-22 NOTE — Progress Notes (Signed)
STROKE TEAM PROGRESS NOTE   INTERVAL HISTORY Her daughter is at the bedside. Symptoms first noted Tues am. She did not notice Monday night before d/t alcohol intake. She did not notice any weakness, though pt was in bed. Pt recently traveled via airplane to [place].  I personally reviewed history of presenting illness with the patient and daughter, electronic medical records and imaging films in PACS.  MRI scan and follow-up CT scan showed evolving hemorrhagic left MCA infarct with stable areas of hemorrhage.  CT angiogram shows left M2 occlusion.  LDL cholesterol is 127 mg percent and hemoglobin A1c 6.1.  Echocardiogram is pending Vitals:   04/22/20 0500 04/22/20 0620 04/22/20 0737 04/22/20 0824  BP: (!) 151/80 135/87 (!) 126/93 115/88  Pulse: (!) 113 (!) 128 (!) 127 (!) 114  Resp: 15 17 (!) 21 19  Temp: 98.1 F (36.7 C) 98.2 F (36.8 C) 97.8 F (36.6 C) 97.8 F (36.6 C)  TempSrc: Axillary Oral Oral Oral  SpO2: 98% 98% 95% 100%  Weight:       CBC:  Recent Labs  Lab 04/21/20 1749  WBC 5.1  NEUTROABS 3.7  HGB 13.0  HCT 39.0  MCV 88.8  PLT 305   Basic Metabolic Panel:  Recent Labs  Lab 04/21/20 1749  NA 137  K 4.0  CL 102  CO2 19*  GLUCOSE 128*  BUN 7*  CREATININE 0.70  CALCIUM 9.2   Lipid Panel:  Recent Labs  Lab 04/22/20 0338  CHOL 203*  TRIG 69  HDL 62  CHOLHDL 3.3  VLDL 14  LDLCALC 409127*   HgbA1c:  Recent Labs  Lab 04/22/20 0338  HGBA1C 6.1*   Urine Drug Screen:  Recent Labs  Lab 04/21/20 2000  LABOPIA NONE DETECTED  COCAINSCRNUR NONE DETECTED  LABBENZ NONE DETECTED  AMPHETMU NONE DETECTED  THCU POSITIVE*  LABBARB NONE DETECTED    Alcohol Level  Recent Labs  Lab 04/21/20 1749  ETH <10    IMAGING past 24 hours CT Angio Head W or Wo Contrast  Result Date: 04/21/2020 CLINICAL DATA:  Stroke.  Slurred speech EXAM: CT ANGIOGRAPHY HEAD AND NECK TECHNIQUE: Multidetector CT imaging of the head and neck was performed using the standard protocol  during bolus administration of intravenous contrast. Multiplanar CT image reconstructions and MIPs were obtained to evaluate the vascular anatomy. Carotid stenosis measurements (when applicable) are obtained utilizing NASCET criteria, using the distal internal carotid diameter as the denominator. CONTRAST:  75mL OMNIPAQUE IOHEXOL 350 MG/ML SOLN COMPARISON:  CT head 04/21/2020 FINDINGS: CTA NECK FINDINGS Aortic arch: Mild atherosclerotic calcification aortic arch. Bovine branching pattern. Proximal great vessels patent. Right carotid system: Right carotid widely patent without stenosis. Minimal atherosclerotic calcification right carotid bulb. Medial deviation of the carotid bifurcation and internal carotid artery posterior to the pharynx. Left carotid system: Left carotid widely patent with mild atherosclerotic calcification at the bifurcation. Medial deviation of the carotid bifurcation internal carotid artery in the retropharyngeal space. Vertebral arteries: Left vertebral artery dominant. Left vertebral artery patent to the basilar without stenosis. Small right vertebral artery without stenosis. Skeleton: No acute skeletal abnormality. Other neck: Negative for mass or adenopathy in the neck. Upper chest: Lung apices clear bilaterally. Superior mediastinum negative. Review of the MIP images confirms the above findings CTA HEAD FINDINGS Anterior circulation: Internal carotid artery widely patent bilaterally. Left M1 segment widely patent. There is occlusion of the proximal left M2 inferior division which appears acute. This corresponds to low-density on CT compatible with acute  infarct. Superior division left MCA patent Both anterior cerebral arteries widely patent. Right middle cerebral artery and branches widely patent. Posterior circulation: Both vertebral arteries patent to the basilar. PICA patent bilaterally. Basilar widely patent. Superior cerebellar and posterior cerebral arteries patent bilaterally without  stenosis or occlusion. Venous sinuses: Early contrast enhancement of the dural sinuses not well evaluated. Anatomic variants: None Review of the MIP images confirms the above findings IMPRESSION: 1. 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 Acute occlusion inferior division left M2  . 3. No other intracranial stenosis 4. No extracranial stenosis in the carotid or vertebral arteries. 5. These results were called by telephone at the time of interpretation on 04/21/2020 at 6:59 pm to provider JOSHUA LONG , who verbally acknowledged these results. Electronically Signed   By: Marlan Palau M.D.   On: 04/21/2020 19:00   CT HEAD WO CONTRAST  Result Date: 04/22/2020 CLINICAL DATA:  Stroke follow-up. EXAM: CT HEAD WITHOUT CONTRAST TECHNIQUE: Contiguous axial images were obtained from the base of the skull through the vertex without intravenous contrast. COMPARISON:  Head CT, CTA, and MRI 04/21/2020 FINDINGS: Brain: An evolving moderately large acute left MCA infarct is similar in extent to the prior MRI with increasing cytotoxic edema. There is partial effacement of the left lateral ventricle with at most trace midline shift. Multiple small areas of hemorrhage within the infarct are similar to the MRI. There is no extra-axial fluid collection. There is a background of mild chronic small vessel ischemia in the cerebral white matter bilaterally. Vascular: Calcified atherosclerosis at the skull base. Hyperdense left MCA branch vessel corresponding to the M2 occlusion on CTA. Skull: No fracture or suspicious osseous lesion. Sinuses/Orbits: Opacification of a single right ethmoid air cell. Clear mastoid air cells. Unremarkable orbits. Other: None. IMPRESSION: Evolving left MCA infarct with unchanged small areas of hemorrhage. Electronically Signed   By: Sebastian Ache M.D.   On: 04/22/2020 07:47   CT Head Wo Contrast  Result Date: 04/21/2020 CLINICAL DATA:  Stroke suspected, slurred speech, right-sided weakness and facial  droop EXAM: CT HEAD WITHOUT CONTRAST TECHNIQUE: Contiguous axial images were obtained from the base of the skull through the vertex without intravenous contrast. COMPARISON:  None. FINDINGS: Brain: There is a hypodensity of the anterior left MCA territory centered about the insula and frontal operculum, involving the left basal ganglia, particularly the putamen and internal capsule, as well as the high left MCA territory cortex. There is no involvement of the temporal lobe. No evidence of hemorrhage. There is mild mass effect on the left lateral ventricle with approximately 3 mm left right midline shift. Vascular: Subtly hyperdense M2 segmental vessel in the left insula (series 2, image 12). Skull: Normal. Negative for fracture or focal lesion. Sinuses/Orbits: No acute finding. Other: None. IMPRESSION: 1. There is a large hypodensity of the anterior left MCA territory, findings consistent with acute to subacute left MCA territory infarction. ASPECTS 4. 2.  No evidence of hemorrhage. 3. There is mild mass effect on the left lateral ventricle with approximately 3 mm left right midline shift. Findings reported by telephone to Dr. Jacqulyn Bath at the time of interpretation, 6:48 p.m., 04/21/2020 Electronically Signed   By: Lauralyn Primes M.D.   On: 04/21/2020 18:53   CT Angio Neck W and/or Wo Contrast  Result Date: 04/21/2020 CLINICAL DATA:  Stroke.  Slurred speech EXAM: CT ANGIOGRAPHY HEAD AND NECK TECHNIQUE: Multidetector CT imaging of the head and neck was performed using the standard protocol during bolus  administration of intravenous contrast. Multiplanar CT image reconstructions and MIPs were obtained to evaluate the vascular anatomy. Carotid stenosis measurements (when applicable) are obtained utilizing NASCET criteria, using the distal internal carotid diameter as the denominator. CONTRAST:  34mL OMNIPAQUE IOHEXOL 350 MG/ML SOLN COMPARISON:  CT head 04/21/2020 FINDINGS: CTA NECK FINDINGS Aortic arch: Mild  atherosclerotic calcification aortic arch. Bovine branching pattern. Proximal great vessels patent. Right carotid system: Right carotid widely patent without stenosis. Minimal atherosclerotic calcification right carotid bulb. Medial deviation of the carotid bifurcation and internal carotid artery posterior to the pharynx. Left carotid system: Left carotid widely patent with mild atherosclerotic calcification at the bifurcation. Medial deviation of the carotid bifurcation internal carotid artery in the retropharyngeal space. Vertebral arteries: Left vertebral artery dominant. Left vertebral artery patent to the basilar without stenosis. Small right vertebral artery without stenosis. Skeleton: No acute skeletal abnormality. Other neck: Negative for mass or adenopathy in the neck. Upper chest: Lung apices clear bilaterally. Superior mediastinum negative. Review of the MIP images confirms the above findings CTA HEAD FINDINGS Anterior circulation: Internal carotid artery widely patent bilaterally. Left M1 segment widely patent. There is occlusion of the proximal left M2 inferior division which appears acute. This corresponds to low-density on CT compatible with acute infarct. Superior division left MCA patent Both anterior cerebral arteries widely patent. Right middle cerebral artery and branches widely patent. Posterior circulation: Both vertebral arteries patent to the basilar. PICA patent bilaterally. Basilar widely patent. Superior cerebellar and posterior cerebral arteries patent bilaterally without stenosis or occlusion. Venous sinuses: Early contrast enhancement of the dural sinuses not well evaluated. Anatomic variants: None Review of the MIP images confirms the above findings IMPRESSION: 1. 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 Acute occlusion inferior division left M2  . 3. No other intracranial stenosis 4. No extracranial stenosis in the carotid or vertebral arteries. 5. These results were called by  telephone at the time of interpretation on 04/21/2020 at 6:59 pm to provider JOSHUA LONG , who verbally acknowledged these results. Electronically Signed   By: Marlan Palau M.D.   On: 04/21/2020 19:00   MR BRAIN WO CONTRAST  Result Date: 04/21/2020 CLINICAL DATA:  Stroke slurred speech EXAM: MRI HEAD WITHOUT CONTRAST TECHNIQUE: Multiplanar, multiecho pulse sequences of the brain and surrounding structures were obtained without intravenous contrast. COMPARISON:  CT angio head neck 05/01/2020 FINDINGS: Brain: Acute infarct left MCA territory. Infarct involves the left frontal lobe, insula as well as the caudate and putamen. There are areas of hemorrhage within the caudate and putamen as well as in the left frontal lobe. These are not identified on CT. Scattered small white matter hyperintensities elsewhere compatible with chronic microvascular ischemia. Ventricle size normal. Negative for mass lesion. Vascular: Normal arterial flow voids Skull and upper cervical spine: No focal skeletal lesion Sinuses/Orbits: Negative Other: None IMPRESSION: Acute infarct left MCA territory there is mild hemorrhage in the infarct which is not seen on recent CT earlier today. These results were called by telephone at the time of interpretation on 04/21/2020 at 9:52 pm to provider CHING TU , who verbally acknowledged these results. Electronically Signed   By: Marlan Palau M.D.   On: 04/21/2020 21:53    PHYSICAL EXAM Pleasant middle-aged African-American lady not in distress. . Afebrile. Head is nontraumatic. Neck is supple without bruit.    Cardiac exam no murmur or gallop. Lungs are clear to auscultation. Distal pulses are well felt. Neurological Exam : Patient is awake alert.  She is  globally aphasic.  She speaks only occasional words which are mostly nonsensical.  She can follows some commands to pantomime and occasional midline.  Unable to name repeat or comprehend.  Left gaze preference but can look to the right past  midline.  Does not blink to threat on the right but does so on the left.  Right lower facial weakness.  Tongue midline.  Dense right hemiplegia with only trace withdrawal to painful stimuli on the right and purposeful antigravity movements on the left.  Tone is diminished on the right and normal on the left right plantar upgoing left downgoing.  Gait not tested. ASSESSMENT/PLAN Ms. Kelsay Haggard is a 64 y.o. female with history of HTN presenting with slurred speech and right-sided weakness.   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 Mild L lateral ventricle mass effect w/ 70mm midline shift. ASPECTS 4    CTA head & neck L MCA infarct. L M2 occlusion.   MRI  L MCA infarct w/ mild hemorrhagic transformation   Repeat CT head evolving L MCA infarct w/ stable hemorrhage   2D Echo pending   LE doppler pending - given recent airline travel  LDL 127  HgbA1c 6.1  VTE prophylaxis - added Lovenox 40 mg sq daily (ok in setting of hemorrhagic transformation)  No antithrombotic prior to admission, now on No antithrombotic given hemorrhagic transformation. Ok for aspirin (not ok for [facility]). Will add.     Therapy recommendations:  SLP  Disposition:  pending   Atrial Fibrillation w/ RVR, new dx  CHA2DS2-VASc Score = at least 5, ?2 oral anticoagulation recommended  Age in Years:  <65   0    Sex:  Female   Female   +1    Hypertension History:  yes   +1     Diabetes Mellitus:  yes   +1  Congestive Heart Failure History:  0  Vascular Disease History:  0    Stroke/TIA/Thromboembolism History:  yes   +2 . Not an AC candidate at this time given hemorrhagic transformation of infarct   Hypertension  Home meds:  norvasc 10, atenolol 25  Stable . Permissive hypertension (OK if < 220/120) but gradually normalize in 5-7 days . Long-term BP goal normotensive  Hyperlipidemia  Home  meds:  No statin  On lipitor 40  LDL 127, goal < 70  Continue statin at discharge  PreDiabetes   HgbA1c 6.1, goal < 7.0  CBGs Recent Labs    04/21/20 1743  GLUCAP 133*      SSI  Dysphagia . Secondary to stroke . NPO . Speech on board   Other Stroke Risk Factors  Former Cigarette smoker, quit 41 yrs ago  ETOH use, alcohol level <10, advised to drink no more than 1 drink(s) a day. On CIWA protocol  Substance abuse - UDS:  THC POSITIVE, Patient advised to stop using due to stroke risk.  Hospital day # 1  She presented with aphasia and right hemiplegia he presented to late to be considered for thrombolysis or intervention.  She has significant global aphasia and right hemiplegia and prognosis is guarded.  Long discussion at the bedside with the patient's daughter who appears quite distraught and tearful and she feels responsible for the delay in her presentation.  Check echocardiogram.  Speech therapy for swallow eval.  He is rectal aspirin until she is able to swallow.  She will need to be started on anticoagulation after 3 to 5 days when her hemorrhagic transformation remained stable.  Discussed with Dr. Judeth Cornfield.  Greater than 50% time during this 35-minute visit was spent on counseling and coordination of care and answering questions Delia Heady, MD To contact Stroke Continuity provider, please refer to [email or web address]. After hours, contact General Neurology

## 2020-04-22 NOTE — Evaluation (Signed)
Speech Language Pathology Evaluation Patient Details Name: Lindsey [name] MRN: [id number] DOB: 10-31-1955 Today's Date: 04/22/2020 Time: 1194-1740 SLP Time Calculation (min) (ACUTE ONLY): 26 min  Problem List:  Patient Active Problem List   Diagnosis Date Noted  . CVA (cerebral vascular accident) (HCC) 04/21/2020  . Unspecified atrial fibrillation (HCC) 04/21/2020  . Essential hypertension 04/21/2020  . Alcohol abuse 04/21/2020   Past Medical History:  Past Medical History:  Diagnosis Date  . Hypertension    Past Surgical History: History reviewed. No pertinent surgical history. HPI:  Lindsey [name] is a 64 y.o. female with past medical history significant for hypertension presents emergency department with slurred speech and right-sided weakness noted by family today. Last seen normal by her daughter was 4:30 PM yesterday.  The daughter works evening shift and prior to her leaving patient was drinking.  This morning patient did not get up, the granddaughter noticed patient speech was gibberish but thought this was due to her being intoxicated.  Patient was in bed all day and could not getting words out and daughter finally decided to call EMS.  Patient did not arrive as a code stroke as she was outside the 24-hour window. On arrival patient had left gaze deviation, right-sided weakness and was aphasic and severely dysarthric following only some commands.  Stat CT head was obtained which showed large acute left MCA stroke, CTA confirmed a left M2 occlusion.  Unfortunately patient not a candidate for thrombectomy due to being outside the window and this is an established infarct.   Assessment / Plan / Recommendation Clinical Impression  Patient presents with severe dysarthria, facial, trigimenal, hypoglossal nerve impairments and aphasia prohobiting her participating in her healthcare.  She is able to conduct automatic speech tasks with moderate visual/tactile cueing including counting 1-10 and  singing.  Pt did not follow one step commands nor say her name.  She is also lethargic and thus evaluation was limited.  Pt was able to repeat single word x1/5 offered - her desired name  (Lindsey [name] vs Lindsey [name]) however severe dysarthria noted.  Pt will need extensive speech/language treatment to maximize basic functional communication.  Advised pt and daughter of language goal being first correctly answering basic yes/no via head nod or verbalization for basic communication of needs/discomfort etc.    SLP Assessment  SLP Visit Diagnosis: Dysarthria and anarthria (R47.1);Aphasia (R47.01);Other (comment) (motor planning deficits)    Follow Up Recommendations  Skilled Nursing facility;Inpatient Rehab    Frequency and Duration min 2x/week         SLP Evaluation Cognition  Overall Cognitive Status: Difficult to assess Arousal/Alertness: Awake/alert Orientation Level: Other (comment) (gross dysarthria and aphasia) Memory:  (did not test due to pt's language deficits)       Comprehension  Auditory Comprehension Overall Auditory Comprehension: Impaired Yes/No Questions: Impaired Basic Biographical Questions: 0-25% accurate Basic Immediate Environment Questions: 0-24% accurate Complex Questions: Not tested Commands: Impaired One Step Basic Commands: 0-24% accurate Interfering Components: Motor planning;Attention;Other (comment);Processing speed (lethargy) Reading Comprehension Reading Status: Impaired (did not identify her name from choice of 2) Word level: Impaired (did not identify correct name of object with choice of 2) Sentence Level: Not tested Paragraph Level: Not tested Functional Environmental (signs, name badge): Not tested    Expression Expression Primary Mode of Expression: Verbal Verbal Expression Overall Verbal Expression: Impaired Initiation: Impaired Automatic Speech: Counting;Singing Level of Generative/Spontaneous Verbalization: Word (pt is grossly dysarthric,  unable to comprehend) Repetition: Impaired (single word attempts x2 unable) Level of Impairment:  Word level Naming: Impairment Responsive: Not tested Confrontation: Impaired Other Naming Comments: 0/2 with verbal and written choice Verbal Errors: Perseveration (perseveration on 10) Pragmatics: Unable to assess Written Expression Dominant Hand: Right Written Expression: Not tested (pt with right sided severe weakness)   Oral / Motor  Oral Motor/Sensory Function Overall Oral Motor/Sensory Function: Severe impairment Facial ROM: Reduced right Facial Symmetry: Abnormal symmetry right Facial Strength: Reduced right Facial Sensation: Reduced right Lingual ROM: Reduced right Lingual Symmetry: Abnormal symmetry right Lingual Strength: Reduced Lingual Sensation: Reduced Velum: Within Functional Limits Mandible: Within Functional Limits Motor Speech Overall Motor Speech: Impaired Respiration: Impaired Phonation: Low vocal intensity;Hoarse Articulation: Impaired Level of Impairment: Word Intelligibility: Intelligibility reduced Word: 0-24% accurate Phrase: 0-24% accurate Sentence: Not tested Conversation: Not tested Motor Planning: Impaired Level of Impairment: Word Motor Speech Errors: Unaware   GO                    Lindsey [name] 04/22/2020, 11:05 AM   Lindsey [name], Lindsey [name] [facility] SLP Acute Rehab Services Office [phone]

## 2020-04-22 NOTE — Progress Notes (Signed)
NURSING PROGRESS NOTE  Lindsey [name]: [id number] Admission Data: 04/22/2020 Attending Provider: Maretta Bees, MD PCP: Emogene Morgan, MD Code status: Full  Allergies: No Known Allergies  Past Medical History:  Past Medical History:  Diagnosis Date  . Hypertension    Past Surgical History: History reviewed. No pertinent surgical history.  [name] is a 64 y.o. female patient, arrived to floor in room [phone] via stretcher, transferred from ED. Patient alert and oriented to self, but unable to answer questions due to dysphagia. No acute distress noted. Denies pain.   Vital signs: Oral temperature 98.3 F (36.8 C), Blood pressure 138/93, Pulse 143, RR 20, SpO2 99 % on room air.   Cardiac monitoring: Room Telemetry applied and second verified  IV access: Right AC--saline locked; condition patent and no redness.  Skin: intact, no pressure ulcer noted in sacral area. Second verified by [facility]   Patient's ID armband verified with patient and in place. Fall risk assessed and SR up X2. Patient oriented to room and equipment. Call bell within reach.

## 2020-04-22 NOTE — Progress Notes (Signed)
Received report from Jeannett Senior in the ED.

## 2020-04-22 NOTE — Care Management (Signed)
Spoke w Dennison Bulla Revels [phone] of [employer] to make referral to start Medicaid application. She stated that the financial counselors still had it under review and would release it to [employer] in the next day or so at which time they would be able to start application.

## 2020-04-22 NOTE — Progress Notes (Signed)
Initial Nutrition Assessment  DOCUMENTATION CODES:   Not applicable  INTERVENTION:  Initiate Osmolite 1.5 formula @ 20 ml/hr via Cortrak NGT and increase by 10 ml every 4 hours to goal rate of 50 ml/hr.   Provide 45 ml Prosource TF BID per tube.    Tube feeding regimen provides 1880 kcal (100% of needs), 97 grams of protein, and 912 ml of H2O.   NUTRITION DIAGNOSIS:   Inadequate oral intake related to inability to eat as evidenced by NPO status.  GOAL:   Patient will meet greater than or equal to 90% of their needs  MONITOR:   TF tolerance, Skin, Weight trends, Labs, I & O's  REASON FOR ASSESSMENT:   Consult Enteral/tube feeding initiation and management  ASSESSMENT:   64 y.o. female with history of HTN, EtOH use-who presented with right-sided hemiparesis, dysarthria-found to have acute CVA and A. fib with RVR.  Pt unavailable during attempted time of contact. RD unable to obtain pt nutrition history at this time. Pt NPO due to decreased mentation and moderaely severe dysphagia per SLP evaluation. Cortrak NGT placed today with tip of tube in stomach. RD to order tube feeding.   Unable to complete Nutrition-Focused physical exam at this time.   Labs and medications reviewed.   Diet Order:   Diet Order            Diet NPO time specified  Diet effective now                 EDUCATION NEEDS:   Not appropriate for education at this time  Skin:  Skin Assessment: Reviewed RN Assessment  Last BM:  Unknown  Height:   Ht Readings from Last 1 Encounters:  08/07/19 5\' 5"  (1.651 m)    Weight:   Wt Readings from Last 1 Encounters:  04/22/20 75.8 kg    BMI:  Body mass index is 27.81 kg/m.  Estimated Nutritional Needs:   Kcal:  1850-2000  Protein:  90-100 grams  Fluid:  >/= 1.8 L/day  04/24/20, MS, RD, LDN RD pager number/after hours weekend pager number on Amion.

## 2020-04-22 NOTE — Evaluation (Signed)
Physical Therapy Evaluation Patient Details Name: Lindsey [name] MRN: [id number] DOB: 09-12-1956 Today's Date: 04/22/2020   History of Present Illness  64 yo female admitted to ED on 7/20 with R weakness, facial droop, and severe dysarthria. CT and MRI brain reveal large L MCA, initially ischemic but with some hemorrhagic conversion. PMH includes HTN, afib with RVR.  Clinical Impression   Pt presents with R neglect with eyes not crossing midline, R hemiplegia, increased tone RLE, impaired R sided sensation, poor sitting balance, and decreased activity tolerance. Pt to benefit from acute PT to address deficits. Pt sat EOB with PT/OT for ~10 minutes this day, requiring mod-max +2 for bed mobility and sitting EOB. Pt with good initiation of mobility with LUE/LLE and appears to understand verbal cuing ~75% of the time during session demonstrated in pt physical and verbal response to therapists. Pt required max multimodal cuing for attention to R, with no instances of eyes crossing midline. Per pt's daughter, at baseline pt is very independent and cheerful, PT recommending CIR to maximize pt functional mobility post-CVA. PT to progress mobility as tolerated, and will continue to follow acutely.    HR - afib rhythm, 120-159 bpm   Follow Up Recommendations CIR;Supervision/Assistance - 24 hour    Equipment Recommendations  Other (comment) (TBD)    Recommendations for Other Services       Precautions / Restrictions Precautions Precautions: Fall Precaution Comments: R neglect, R hemi Restrictions Weight Bearing Restrictions: No      Mobility  Bed Mobility Overal bed mobility: Needs Assistance Bed Mobility: Rolling;Sidelying to Sit;Sit to Supine Rolling: Mod assist;Max assist Sidelying to sit: Max assist;+2 for physical assistance;+2 for safety/equipment;HOB elevated   Sit to supine: Max assist;+2 for safety/equipment;+2 for physical assistance;HOB elevated   General bed mobility comments:  Mod assist for rolling R with pt initiation of UE and translating trunk, more like max assist for roll to L due to flaccidity RUE. Max +2 for supine<>sit for trunk and LE management, pt initiating sitting up with use of LUE and lowering LLE over EOB. Requires total assist for RUE/LE management  Transfers                 General transfer comment: not assessed this day  Ambulation/Gait                Stairs            Wheelchair Mobility    Modified Rankin (Stroke Patients Only)       Balance Overall balance assessment: Needs assistance Sitting-balance support: Feet supported;Single extremity supported Sitting balance-Leahy Scale: Poor Sitting balance - Comments: reliant on min-mod assist from PT/OT for sitting EOB, RUE placement, correcting posterior leaning Postural control: Posterior lean     Standing balance comment: unable to assess this day                             Pertinent Vitals/Pain Pain Assessment: No/denies pain    Home Living Family/patient expects to be discharged to:: Private residence Living Arrangements: Children Available Help at Discharge: Family;Available PRN/intermittently (daughter works nights, 5pm-1:30.May have other familyto help) Type of Home: House Home Access: Level entry     Home Layout: Multi-level;Able to live on main level with bedroom/bathroom;Other (Comment);1/2 bath on main level (currently on second floor but can move) Home Equipment: None      Prior Function Level of Independence: Independent  Comments: driving     Hand Dominance   Dominant Hand: Right    Extremity/Trunk Assessment   Upper Extremity Assessment Upper Extremity Assessment: Defer to OT evaluation;RUE deficits/detail RUE Deficits / Details: flaccid RUE Sensation: decreased light touch    Lower Extremity Assessment Lower Extremity Assessment: RLE deficits/detail RLE Deficits / Details: mild hypertonicty in hip/knee  flexion, no active muscle contraction appreciated. + withdrawal to painful stimuli RLE Sensation: decreased light touch    Cervical / Trunk Assessment Cervical / Trunk Assessment: Normal  Communication   Communication: Receptive difficulties;Expressive difficulties  Cognition Arousal/Alertness: Awake/alert;Lethargic (periods of lethargy, improved with sitting EOB.) Behavior During Therapy: WFL for tasks assessed/performed Overall Cognitive Status: Difficult to assess Area of Impairment: Following commands;Safety/judgement;Awareness;Problem solving                       Following Commands: Follows one step commands inconsistently;Follows one step commands with increased time Safety/Judgement: Decreased awareness of deficits;Decreased awareness of safety Awareness: Intellectual Problem Solving: Slow processing;Difficulty sequencing;Decreased initiation;Requires verbal cues;Requires tactile cues General Comments: Pt with significant R inattention vs neglect, eyes not crossing midline for tracking tasks. Follows commands inconsistently (example: cannot give "thumbs up" but can move LLE to EOB when asked to sit EOB). Requires multimodal cuing throughout for attending to R, safe mobility. Pt stating yes/no consistently, and pt nods in understanding when asked if she knows she had CVA and recognizes daughter.      General Comments General comments (skin integrity, edema, etc.): pt in afib with HR 120s-158 bpm during session    Exercises     Assessment/Plan    PT Assessment Patient needs continued PT services  PT Problem List Decreased strength;Decreased mobility;Decreased safety awareness;Impaired tone;Decreased coordination;Decreased range of motion;Decreased activity tolerance;Decreased cognition;Cardiopulmonary status limiting activity;Decreased balance;Decreased knowledge of use of DME;Impaired sensation       PT Treatment Interventions DME instruction;Therapeutic  activities;Gait training;Therapeutic exercise;Patient/family education;Balance training;Stair training;Functional mobility training;Neuromuscular re-education    PT Goals (Current goals can be found in the Care Plan section)  Acute Rehab PT Goals Patient Stated Goal: states "yes indeed" to PT stating "we will keep working on your mobility and strength" PT Goal Formulation: With patient/family Time For Goal Achievement: 05/06/20 Potential to Achieve Goals: Good    Frequency Min 4X/week   Barriers to discharge        Co-evaluation PT/OT/SLP Co-Evaluation/Treatment: Yes Reason for Co-Treatment: Complexity of the patient's impairments (multi-system involvement);For patient/therapist safety;To address functional/ADL transfers PT goals addressed during session: Mobility/safety with mobility;Balance         AM-PAC PT "6 Clicks" Mobility  Outcome Measure Help needed turning from your back to your side while in a flat bed without using bedrails?: A Lot Help needed moving from lying on your back to sitting on the side of a flat bed without using bedrails?: Total Help needed moving to and from a bed to a chair (including a wheelchair)?: Total Help needed standing up from a chair using your arms (e.g., wheelchair or bedside chair)?: Total Help needed to walk in hospital room?: Total Help needed climbing 3-5 steps with a railing? : Total 6 Click Score: 7    End of Session   Activity Tolerance: Patient limited by fatigue Patient left: in bed;with call bell/phone within reach;with bed alarm set;with family/visitor present (with bed positioned in chair-like position) Nurse Communication: Mobility status PT Visit Diagnosis: Other abnormalities of gait and mobility (R26.89);Hemiplegia and hemiparesis Hemiplegia - Right/Left: Right Hemiplegia - dominant/non-dominant: Dominant  Hemiplegia - caused by: Cerebral infarction    Time: 1751-0258 PT Time Calculation (min) (ACUTE ONLY): 29  min   Charges:   PT Evaluation $PT Eval Low Complexity: 1 Low          Lama Narayanan E, PT Acute Rehabilitation Services Pager [phone]  Office [phone]   Camara Renstrom D Despina Hidden 04/22/2020, 3:33 PM

## 2020-04-22 NOTE — Progress Notes (Signed)
PROGRESS NOTE        PATIENT DETAILS Name: Lindsey [name] Age: 64 y.o. Sex: female Date of Birth: [birth year] Admit Date: 04/21/2020 Admitting Physician Anselm Jungling, DO WGN:FAOZHY, Ngwe A, MD  Brief Narrative: Patient is a 64 y.o. female with history of HTN, EtOH use-who presented with right-sided hemiparesis, dysarthria-found to have acute CVA and A. fib with RVR.  Significant events: 7/20>> admit for acute CVA and A. fib with RVR  Significant studies: 7/21>> LDL: 127 7/21>> A1c: 6.1 7/21>> echo: Pending 7/21>> CT head: Evolving left MCA infarct with unchanged small areas of hemorrhage. 7/20>> MRI brain: Acute infarct left MCA territory-mild hemorrhage in the infarct 7/20>> CTA head/neck: Acute occlusion of the inferior division of left M2, no other intracranial/extracranial stenosis.  Antimicrobial therapy: None  Microbiology data: 7/20>> blood culture: No growth  Procedures : None  Consults: Neurology  DVT Prophylaxis : enoxaparin (LOVENOX) injection 40 mg Start: 04/22/20 1100   Subjective: Remains with dense right-sided hemiplegia-dysarthric this morning.  But otherwise appears comfortable.  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 fib.  See work-up results above.  Neurology following-continue ASA.  Will await further recommendations from neurology.  Atrial fibrillation with RVR: BP soft this morning-unable to tolerate oral intake due to severe dysphagia.  Will start IV digoxin to see if we can control her rate-once NG tube placed-we will start low-dose oral beta-blockers.  Will need to be cautious with rate control agents-so as to not drop her blood pressure significantly.  Currently and not a candidate for anticoagulation due to hemorrhagic transformation  Dysphagia: Secondary to CVA-discussed with SLP-not safe  oral intake-Place cortrak  HTN: BP stable-plan to start metoprolol for rate control-follow closely.  HLD: Start statin-once NG tube placed-LDL above  EtOH abuse: Per daughter-patient does not drink every day-apparently has gone several days without withdrawal symptoms when not drinking.  Watch closely for withdrawal symptoms-currently none.  Diet: Diet Order            Diet NPO time specified  Diet effective now                  Code Status: Full code   Family Communication: Daughter at bedside  Disposition Plan: Status is: Inpatient  Remains inpatient appropriate because:Inpatient level of care appropriate due to severity of illness   Dispo: The patient is from: Home              Anticipated d/c is to: [facility]              Anticipated d/c date is: 2 days              Patient currently is not medically stable to d/c.  Barriers to Discharge: Acute CVA with dense right-sided hemiplegia/dysphagia-work-up in progress  Antimicrobial agents: Anti-infectives (From admission, onward)   None       Time spent: 25 minutes-Greater than 50% of this time was spent in counseling, explanation of diagnosis, planning of further management, and coordination of care.  MEDICATIONS: Scheduled Meds: .  stroke: mapping our early stages of recovery book   Does not apply Once  . aspirin  300 mg Rectal Daily   Or  . aspirin  81 mg Per Tube Daily  . atorvastatin  40 mg Oral  q1800  . digoxin  0.25 mg Intravenous Q6H  . enoxaparin (LOVENOX) injection  40 mg Subcutaneous Q24H   Continuous Infusions: PRN Meds:.acetaminophen **OR** acetaminophen (TYLENOL) oral liquid 160 mg/5 mL **OR** acetaminophen   PHYSICAL EXAM: Vital signs: Vitals:   04/22/20 0620 04/22/20 0737 04/22/20 0824 04/22/20 1215  BP: 135/87 (!) 126/93 115/88 126/77  Pulse: (!) 128 (!) 127 (!) 114   Resp: 17 (!) 21 19 (!) 25  Temp: 98.2 F (36.8 C) 97.8 F (36.6 C) 97.8 F (36.6 C) 97.8 F (36.6 C)  TempSrc: Oral  Oral Oral Oral  SpO2: 98% 95% 100%   Weight:       Filed Weights   04/22/20 0100  Weight: 75.8 kg   Body mass index is 27.81 kg/m.   Gen Exam:Alert awake-not in any distress HEENT:atraumatic, normocephalic Chest: B/L clear to auscultation anteriorly CVS:S1S2 regular Abdomen:soft non tender, non distended Extremities:no edema Neurology: Dysarthric-dense right-sided hemiplegia Skin: no rash  I have personally reviewed following labs and imaging studies  LABORATORY DATA: CBC: Recent Labs  Lab 04/21/20 1749  WBC 5.1  NEUTROABS 3.7  HGB 13.0  HCT 39.0  MCV 88.8  PLT 305    Basic Metabolic Panel: Recent Labs  Lab 04/21/20 1749  NA 137  K 4.0  CL 102  CO2 19*  GLUCOSE 128*  BUN 7*  CREATININE 0.70  CALCIUM 9.2    GFR: CrCl cannot be calculated (Unknown ideal weight.).  Liver Function Tests: Recent Labs  Lab 04/21/20 1749  AST 56*  ALT 38  ALKPHOS 79  BILITOT 1.4*  PROT 8.4*  ALBUMIN 3.8   No results for input(s): LIPASE, AMYLASE in the last 168 hours. No results for input(s): AMMONIA in the last 168 hours.  Coagulation Profile: Recent Labs  Lab 04/21/20 1749  INR 1.1    Cardiac Enzymes: No results for input(s): CKTOTAL, CKMB, CKMBINDEX, TROPONINI in the last 168 hours.  BNP (last 3 results) No results for input(s): PROBNP in the last 8760 hours.  Lipid Profile: Recent Labs    04/22/20 0338  CHOL 203*  HDL 62  LDLCALC 127*  TRIG 69  CHOLHDL 3.3    Thyroid Function Tests: No results for input(s): TSH, T4TOTAL, FREET4, T3FREE, THYROIDAB in the last 72 hours.  Anemia Panel: No results for input(s): VITAMINB12, FOLATE, FERRITIN, TIBC, IRON, RETICCTPCT in the last 72 hours.  Urine analysis:    Component Value Date/Time   COLORURINE YELLOW 04/21/2020 2000   APPEARANCEUR CLEAR 04/21/2020 2000   APPEARANCEUR Clear 08/17/2014 0948   LABSPEC 1.042 (H) 04/21/2020 2000   LABSPEC 1.018 08/17/2014 0948   PHURINE 6.0 04/21/2020 2000    GLUCOSEU NEGATIVE 04/21/2020 2000   GLUCOSEU Negative 08/17/2014 0948   HGBUR NEGATIVE 04/21/2020 2000   BILIRUBINUR NEGATIVE 04/21/2020 2000   BILIRUBINUR Negative 08/17/2014 0948   KETONESUR 20 (A) 04/21/2020 2000   PROTEINUR NEGATIVE 04/21/2020 2000   NITRITE NEGATIVE 04/21/2020 2000   LEUKOCYTESUR NEGATIVE 04/21/2020 2000   LEUKOCYTESUR Negative 08/17/2014 0948    Sepsis Labs: Lactic Acid, Venous No results found for: LATICACIDVEN  MICROBIOLOGY: Recent Results (from the past 240 hour(s))  SARS Coronavirus 2 by RT PCR (hospital order, performed in [facility] hospital lab) Nasopharyngeal Nasopharyngeal Swab     Status: None   Collection Time: 04/21/20  6:51 PM   Specimen: Nasopharyngeal Swab  Result Value Ref Range Status   SARS Coronavirus 2 NEGATIVE NEGATIVE Final    Comment: (NOTE) SARS-CoV-2 target nucleic acids are  NOT DETECTED.  The SARS-CoV-2 RNA is generally detectable in upper and lower respiratory specimens during the acute phase of infection. The lowest concentration of SARS-CoV-2 viral copies this assay can detect is 250 copies / mL. A negative result does not preclude SARS-CoV-2 infection and should not be used as the sole basis for treatment or other patient management decisions.  A negative result may occur with improper specimen collection / handling, submission of specimen other than nasopharyngeal swab, presence of viral mutation(s) within the areas targeted by this assay, and inadequate number of viral copies (<250 copies / mL). A negative result must be combined with clinical observations, patient history, and epidemiological information.  Fact Sheet for Patients:   BoilerBrush.com.cy  Fact Sheet for Healthcare Providers: https://pope.com/  This test is not yet approved or  cleared by the [place] FDA and has been authorized for detection and/or diagnosis of SARS-CoV-2 by FDA under an Emergency  Use Authorization (EUA).  This EUA will remain in effect (meaning this test can be used) for the duration of the COVID-19 declaration under Section 564(b)(1) of the Act, 21 U.S.C. section 360bbb-3(b)(1), unless the authorization is terminated or revoked sooner.  Performed at [facility], [address], [place], [place] [address]   Culture, blood (Routine X 2) w Reflex to ID Panel     Status: None (Preliminary result)   Collection Time: 04/21/20  8:25 PM   Specimen: BLOOD RIGHT HAND  Result Value Ref Range Status   Specimen Description BLOOD RIGHT HAND  Final   Special Requests   Final    BOTTLES DRAWN AEROBIC AND ANAEROBIC Blood Culture adequate volume   Culture   Final    NO GROWTH < 12 HOURS Performed at [facility], [address] [place], [place] [address]    Report Status PENDING  Incomplete  MRSA PCR Screening     Status: None   Collection Time: 04/22/20  1:17 AM   Specimen: Nasal Mucosa; Nasopharyngeal  Result Value Ref Range Status   MRSA by PCR NEGATIVE NEGATIVE Final    Comment:        The GeneXpert MRSA Assay (FDA approved for NASAL specimens only), is one component of a comprehensive MRSA colonization surveillance program. It is not intended to diagnose MRSA infection nor to guide or monitor treatment for MRSA infections. Performed at [facility], [address], [place], [place] [address]     RADIOLOGY STUDIES/RESULTS: CT Angio Head W or Wo Contrast  Result Date: 04/21/2020 CLINICAL DATA:  Stroke.  Slurred speech EXAM: CT ANGIOGRAPHY HEAD AND NECK TECHNIQUE: Multidetector CT imaging of the head and neck was performed using the standard protocol during bolus administration of intravenous contrast. Multiplanar CT image reconstructions and MIPs were obtained to evaluate the vascular anatomy. Carotid stenosis measurements (when applicable) are obtained utilizing NASCET criteria, using the distal internal carotid diameter as the denominator.  CONTRAST:  75mL OMNIPAQUE IOHEXOL 350 MG/ML SOLN COMPARISON:  CT head 04/21/2020 FINDINGS: CTA NECK FINDINGS Aortic arch: Mild atherosclerotic calcification aortic arch. Bovine branching pattern. Proximal great vessels patent. Right carotid system: Right carotid widely patent without stenosis. Minimal atherosclerotic calcification right carotid bulb. Medial deviation of the carotid bifurcation and internal carotid artery posterior to the pharynx. Left carotid system: Left carotid widely patent with mild atherosclerotic calcification at the bifurcation. Medial deviation of the carotid bifurcation internal carotid artery in the retropharyngeal space. Vertebral arteries: Left vertebral artery dominant. Left vertebral artery patent to  the basilar without stenosis. Small right vertebral artery without stenosis. Skeleton: No acute skeletal abnormality. Other neck: Negative for mass or adenopathy in the neck. Upper chest: Lung apices clear bilaterally. Superior mediastinum negative. Review of the MIP images confirms the above findings CTA HEAD FINDINGS Anterior circulation: Internal carotid artery widely patent bilaterally. Left M1 segment widely patent. There is occlusion of the proximal left M2 inferior division which appears acute. This corresponds to low-density on CT compatible with acute infarct. Superior division left MCA patent Both anterior cerebral arteries widely patent. Right middle cerebral artery and branches widely patent. Posterior circulation: Both vertebral arteries patent to the basilar. PICA patent bilaterally. Basilar widely patent. Superior cerebellar and posterior cerebral arteries patent bilaterally without stenosis or occlusion. Venous sinuses: Early contrast enhancement of the dural sinuses not well evaluated. Anatomic variants: None Review of the MIP images confirms the above findings IMPRESSION: 1. 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 Acute occlusion inferior division left M2  . 3. No other  intracranial stenosis 4. No extracranial stenosis in the carotid or vertebral arteries. 5. These results were called by telephone at the time of interpretation on 04/21/2020 at 6:59 pm to provider JOSHUA LONG , who verbally acknowledged these results. Electronically Signed   By: Marlan Palau M.D.   On: 04/21/2020 19:00   CT HEAD WO CONTRAST  Result Date: 04/22/2020 CLINICAL DATA:  Stroke follow-up. EXAM: CT HEAD WITHOUT CONTRAST TECHNIQUE: Contiguous axial images were obtained from the base of the skull through the vertex without intravenous contrast. COMPARISON:  Head CT, CTA, and MRI 04/21/2020 FINDINGS: Brain: An evolving moderately large acute left MCA infarct is similar in extent to the prior MRI with increasing cytotoxic edema. There is partial effacement of the left lateral ventricle with at most trace midline shift. Multiple small areas of hemorrhage within the infarct are similar to the MRI. There is no extra-axial fluid collection. There is a background of mild chronic small vessel ischemia in the cerebral white matter bilaterally. Vascular: Calcified atherosclerosis at the skull base. Hyperdense left MCA branch vessel corresponding to the M2 occlusion on CTA. Skull: No fracture or suspicious osseous lesion. Sinuses/Orbits: Opacification of a single right ethmoid air cell. Clear mastoid air cells. Unremarkable orbits. Other: None. IMPRESSION: Evolving left MCA infarct with unchanged small areas of hemorrhage. Electronically Signed   By: Sebastian Ache M.D.   On: 04/22/2020 07:47   CT Head Wo Contrast  Result Date: 04/21/2020 CLINICAL DATA:  Stroke suspected, slurred speech, right-sided weakness and facial droop EXAM: CT HEAD WITHOUT CONTRAST TECHNIQUE: Contiguous axial images were obtained from the base of the skull through the vertex without intravenous contrast. COMPARISON:  None. FINDINGS: Brain: There is a hypodensity of the anterior left MCA territory centered about the insula and frontal  operculum, involving the left basal ganglia, particularly the putamen and internal capsule, as well as the high left MCA territory cortex. There is no involvement of the temporal lobe. No evidence of hemorrhage. There is mild mass effect on the left lateral ventricle with approximately 3 mm left right midline shift. Vascular: Subtly hyperdense M2 segmental vessel in the left insula (series 2, image 12). Skull: Normal. Negative for fracture or focal lesion. Sinuses/Orbits: No acute finding. Other: None. IMPRESSION: 1. There is a large hypodensity of the anterior left MCA territory, findings consistent with acute to subacute left MCA territory infarction. ASPECTS 4. 2.  No evidence of hemorrhage. 3. There is mild mass effect on the left lateral  ventricle with approximately 3 mm left right midline shift. Findings reported by telephone to Dr. Jacqulyn Bath at the time of interpretation, 6:48 p.m., 04/21/2020 Electronically Signed   By: Lauralyn Primes M.D.   On: 04/21/2020 18:53   CT Angio Neck W and/or Wo Contrast  Result Date: 04/21/2020 CLINICAL DATA:  Stroke.  Slurred speech EXAM: CT ANGIOGRAPHY HEAD AND NECK TECHNIQUE: Multidetector CT imaging of the head and neck was performed using the standard protocol during bolus administration of intravenous contrast. Multiplanar CT image reconstructions and MIPs were obtained to evaluate the vascular anatomy. Carotid stenosis measurements (when applicable) are obtained utilizing NASCET criteria, using the distal internal carotid diameter as the denominator. CONTRAST:  75mL OMNIPAQUE IOHEXOL 350 MG/ML SOLN COMPARISON:  CT head 04/21/2020 FINDINGS: CTA NECK FINDINGS Aortic arch: Mild atherosclerotic calcification aortic arch. Bovine branching pattern. Proximal great vessels patent. Right carotid system: Right carotid widely patent without stenosis. Minimal atherosclerotic calcification right carotid bulb. Medial deviation of the carotid bifurcation and internal carotid artery  posterior to the pharynx. Left carotid system: Left carotid widely patent with mild atherosclerotic calcification at the bifurcation. Medial deviation of the carotid bifurcation internal carotid artery in the retropharyngeal space. Vertebral arteries: Left vertebral artery dominant. Left vertebral artery patent to the basilar without stenosis. Small right vertebral artery without stenosis. Skeleton: No acute skeletal abnormality. Other neck: Negative for mass or adenopathy in the neck. Upper chest: Lung apices clear bilaterally. Superior mediastinum negative. Review of the MIP images confirms the above findings CTA HEAD FINDINGS Anterior circulation: Internal carotid artery widely patent bilaterally. Left M1 segment widely patent. There is occlusion of the proximal left M2 inferior division which appears acute. This corresponds to low-density on CT compatible with acute infarct. Superior division left MCA patent Both anterior cerebral arteries widely patent. Right middle cerebral artery and branches widely patent. Posterior circulation: Both vertebral arteries patent to the basilar. PICA patent bilaterally. Basilar widely patent. Superior cerebellar and posterior cerebral arteries patent bilaterally without stenosis or occlusion. Venous sinuses: Early contrast enhancement of the dural sinuses not well evaluated. Anatomic variants: None Review of the MIP images confirms the above findings IMPRESSION: 1. 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 Acute occlusion inferior division left M2  . 3. No other intracranial stenosis 4. No extracranial stenosis in the carotid or vertebral arteries. 5. These results were called by telephone at the time of interpretation on 04/21/2020 at 6:59 pm to provider JOSHUA LONG , who verbally acknowledged these results. Electronically Signed   By: Marlan Palau M.D.   On: 04/21/2020 19:00   MR BRAIN WO CONTRAST  Result Date: 04/21/2020 CLINICAL DATA:  Stroke slurred speech EXAM: MRI  HEAD WITHOUT CONTRAST TECHNIQUE: Multiplanar, multiecho pulse sequences of the brain and surrounding structures were obtained without intravenous contrast. COMPARISON:  CT angio head neck 05/01/2020 FINDINGS: Brain: Acute infarct left MCA territory. Infarct involves the left frontal lobe, insula as well as the caudate and putamen. There are areas of hemorrhage within the caudate and putamen as well as in the left frontal lobe. These are not identified on CT. Scattered small white matter hyperintensities elsewhere compatible with chronic microvascular ischemia. Ventricle size normal. Negative for mass lesion. Vascular: Normal arterial flow voids Skull and upper cervical spine: No focal skeletal lesion Sinuses/Orbits: Negative Other: None IMPRESSION: Acute infarct left MCA territory there is mild hemorrhage in the infarct which is not seen on recent CT earlier today. These results were called by telephone at the time of  interpretation on 04/21/2020 at 9:52 pm to provider CHING TU , who verbally acknowledged these results. Electronically Signed   By: Marlan Palau M.D.   On: 04/21/2020 21:53   VAS [place] LOWER EXTREMITY VENOUS (DVT)  Result Date: 04/22/2020  Lower Venous DVTStudy Indications: Stroke.  Comparison Study: no prior Performing Technologist: [name] RVS  Examination Guidelines: A complete evaluation includes B-mode imaging, spectral Doppler, color Doppler, and power Doppler as needed of all accessible portions of each vessel. Bilateral testing is considered an integral part of a complete examination. Limited examinations for reoccurring indications may be performed as noted. The reflux portion of the exam is performed with the patient in reverse Trendelenburg.  +---------+---------------+---------+-----------+----------+--------------+ RIGHT    CompressibilityPhasicitySpontaneityPropertiesThrombus Aging +---------+---------------+---------+-----------+----------+--------------+ CFV      Full            Yes      Yes                                 +---------+---------------+---------+-----------+----------+--------------+ SFJ      Full                                                        +---------+---------------+---------+-----------+----------+--------------+ FV Prox  Full                                                        +---------+---------------+---------+-----------+----------+--------------+ FV Mid   Full                                                        +---------+---------------+---------+-----------+----------+--------------+ FV DistalFull                                                        +---------+---------------+---------+-----------+----------+--------------+ PFV      Full                                                        +---------+---------------+---------+-----------+----------+--------------+ POP      Full           Yes      Yes                                 +---------+---------------+---------+-----------+----------+--------------+ PTV      Full                                                        +---------+---------------+---------+-----------+----------+--------------+  PERO                                                  Not visualized +---------+---------------+---------+-----------+----------+--------------+   +---------+---------------+---------+-----------+----------+--------------+ LEFT     CompressibilityPhasicitySpontaneityPropertiesThrombus Aging +---------+---------------+---------+-----------+----------+--------------+ CFV      Full           Yes      Yes                                 +---------+---------------+---------+-----------+----------+--------------+ SFJ      Full                                                        +---------+---------------+---------+-----------+----------+--------------+ FV Prox  Full                                                         +---------+---------------+---------+-----------+----------+--------------+ FV Mid   Full                                                        +---------+---------------+---------+-----------+----------+--------------+ FV DistalFull                                                        +---------+---------------+---------+-----------+----------+--------------+ PFV      Full                                                        +---------+---------------+---------+-----------+----------+--------------+ POP      Full           Yes      Yes                                 +---------+---------------+---------+-----------+----------+--------------+ PTV      Full                                                        +---------+---------------+---------+-----------+----------+--------------+ PERO                                                  Not visualized +---------+---------------+---------+-----------+----------+--------------+  Summary: BILATERAL: - No evidence of deep vein thrombosis seen in the lower extremities, bilaterally. - No evidence of superficial venous thrombosis in the lower extremities, bilaterally. -No evidence of popliteal cyst, bilaterally.   *See table(s) above for measurements and observations.    Preliminary      LOS: 1 day   Jeoffrey Massed, MD  [facility]    To contact the attending provider between 7A-7P or the covering provider during after hours 7P-7A, please log into the web site [email or web address] and access using universal [place] password for that web site. If you do not have the password, please call the hospital operator.  04/22/2020, 3:56 PM

## 2020-04-22 NOTE — Progress Notes (Signed)
Occupational Therapy Evaluation Patient Details Name: Lindsey [name] MRN: [id number] DOB: Dec 15, 1955 Today's Date: 04/22/2020    History of Present Illness 64 yo female admitted to ED on 7/20 with R weakness, facial droop, and severe dysarthria. CT and MRI brain reveal large L MCA, initially ischemic but with some hemorrhagic conversion. PMH includes HTN, afib with RVR.   Clinical Impression   Patient lives with daughter in a multi level house.  PTA patient independent and driving.  Today patient presenting with significant R side hemiplegia.  R UE flaccid with sensation loss.  R side neglect also significant, as patient not knowing R hand was her own or looking to R side when prompted.  L gaze preference and inability to cross midline.  Requiring max assist x2 with getting to EOB.  Patient able to follow simple commands but inconsistent.  Showing some personality and smiling during session.  Is total assist for ADLs.  Has good potential for rehab and recommending CIR at this time.  Will continue to follow with OT acutely to address the deficits listed below.      Follow Up Recommendations  CIR;Supervision/Assistance - 24 hour    Equipment Recommendations  Other (comment) (defer to next venue)    Recommendations for Other Services       Precautions / Restrictions Precautions Precautions: Fall Precaution Comments: R neglect, R hemi Restrictions Weight Bearing Restrictions: No      Mobility Bed Mobility Overal bed mobility: Needs Assistance Bed Mobility: Rolling;Sidelying to Sit;Sit to Supine Rolling: Mod assist;Max assist Sidelying to sit: Max assist;+2 for physical assistance;+2 for safety/equipment;HOB elevated   Sit to supine: Max assist;+2 for safety/equipment;+2 for physical assistance;HOB elevated   General bed mobility comments: Patient able to initiate movements with L UE.  Rolling better to R side. Requiring total assist with movement or R UE/LE  Transfers                  General transfer comment: not assessed this day    Balance Overall balance assessment: Needs assistance Sitting-balance support: Feet supported;Single extremity supported Sitting balance-Leahy Scale: Poor Sitting balance - Comments: assist varying from min-mod to stay upright at EOB Postural control: Posterior lean;Right lateral lean     Standing balance comment: unable to assess this day                           ADL either performed or assessed with clinical judgement   ADL Overall ADL's : Needs assistance/impaired                                     Functional mobility during ADLs: +2 for physical assistance;Maximal assistance;Total assistance General ADL Comments: Patient total assist for all ADLs at this time due to poor command following, motor planning, and R side weakness.     Vision Baseline Vision/History: No visual deficits Patient Visual Report: Other (comment) (not able to give report) Vision Assessment?: Vision impaired- to be further tested in functional context Additional Comments: Patient not able to follow commands for formal assessment.  L gaze preferance, not moving eyes past midline.  Unable to track focus on object. Likely R field cut      Perception     Praxis      Pertinent Vitals/Pain Pain Assessment: No/denies pain     Hand Dominance Right   Extremity/Trunk Assessment Upper Extremity  Assessment Upper Extremity Assessment: RUE deficits/detail RUE Deficits / Details: flaccid. No shoulder sublux.  Neglect, not aware R hand is her own. RUE Sensation: decreased light touch;decreased proprioception RUE Coordination: decreased fine motor;decreased gross motor   Lower Extremity Assessment Lower Extremity Assessment: Defer to PT evaluation RLE Deficits / Details: mild hypertonicty in hip/knee flexion, no active muscle contraction appreciated. + withdrawal to painful stimuli RLE Sensation: decreased light touch    Cervical / Trunk Assessment Cervical / Trunk Assessment: Normal   Communication Communication Communication: Receptive difficulties;Expressive difficulties   Cognition Arousal/Alertness: Awake/alert;Lethargic (periods of lethargy, improved with sitting EOB) Behavior During Therapy: WFL for tasks assessed/performed Overall Cognitive Status: Difficult to assess Area of Impairment: Following commands;Safety/judgement;Awareness;Problem solving                       Following Commands: Follows one step commands inconsistently;Follows one step commands with increased time Safety/Judgement: Decreased awareness of deficits;Decreased awareness of safety Awareness: Intellectual Problem Solving: Slow processing;Difficulty sequencing;Decreased initiation;Requires verbal cues;Requires tactile cues General Comments: Patient with significant R side neglect and poor motor planning.  Not able to imitate movements (asked to give thumbs up and did more of a waving motion with digits 2-5 moving up and down).  Patient able to follow some simple commands.  Answering yes/no, attempting other words but speech very dysarthric/slurred.   General Comments  HR increasing to highest of 158 when seated at EOB    Exercises     Shoulder Instructions      Home Living Family/patient expects to be discharged to:: Private residence Living Arrangements: Children Available Help at Discharge: Family;Available PRN/intermittently (daughter works nights, 5pm-1:30.May have other familyto help) Type of Home: House Home Access: Level entry     Home Layout: Multi-level;Able to live on main level with bedroom/bathroom;Other (Comment);1/2 bath on main level (currently on second floor but can move) Alternate Level Stairs-Number of Steps: flight   Bathroom Shower/Tub: Walk-in shower (upstairs, only half bath downstairs)   Firefighter: Standard     Home Equipment: None      Lives With: Daughter    Prior  Functioning/Environment Level of Independence: Independent        Comments: driving        OT Problem List: Decreased strength;Decreased range of motion;Decreased activity tolerance;Impaired balance (sitting and/or standing);Impaired vision/perception;Decreased coordination;Decreased cognition;Decreased safety awareness;Decreased knowledge of precautions;Decreased knowledge of use of DME or AE;Impaired sensation;Impaired tone;Impaired UE functional use      OT Treatment/Interventions: Self-care/ADL training;Therapeutic exercise;Neuromuscular education;Energy conservation;DME and/or AE instruction;Manual therapy;Therapeutic activities;Cognitive remediation/compensation;Visual/perceptual remediation/compensation;Patient/family education;Balance training    OT Goals(Current goals can be found in the care plan section) Acute Rehab OT Goals Patient Stated Goal: states "yes indeed" to PT stating "we will keep working on your mobility and strength" OT Goal Formulation: With patient/family Time For Goal Achievement: 05/06/20 Potential to Achieve Goals: Good  OT Frequency: Min 2X/week   Barriers to D/C:            Co-evaluation PT/OT/SLP Co-Evaluation/Treatment: Yes Reason for Co-Treatment: Complexity of the patient's impairments (multi-system involvement);For patient/therapist safety;To address functional/ADL transfers PT goals addressed during session: Mobility/safety with mobility;Balance OT goals addressed during session: ADL's and self-care;Strengthening/ROM      AM-PAC OT "6 Clicks" Daily Activity     Outcome Measure Help from another person eating meals?: Total Help from another person taking care of personal grooming?: Total Help from another person toileting, which includes using toliet, bedpan, or urinal?: Total Help from another person  bathing (including washing, rinsing, drying)?: Total Help from another person to put on and taking off regular upper body clothing?:  Total Help from another person to put on and taking off regular lower body clothing?: Total 6 Click Score: 6   End of Session Nurse Communication: Mobility status  Activity Tolerance: Patient tolerated treatment well Patient left: in bed;with call bell/phone within reach;with bed alarm set;with family/visitor present  OT Visit Diagnosis: Unsteadiness on feet (R26.81);Other abnormalities of gait and mobility (R26.89);Muscle weakness (generalized) (M62.81);Other symptoms and signs involving the nervous system (R29.898);Other symptoms and signs involving cognitive function;Feeding difficulties (R63.3);Hemiplegia and hemiparesis;Cognitive communication deficit (R41.841) Symptoms and signs involving cognitive functions: Cerebral infarction Hemiplegia - Right/Left: Right Hemiplegia - dominant/non-dominant: Dominant Hemiplegia - caused by: Cerebral infarction                Time: 7106-2694 OT Time Calculation (min): 29 min Charges:  OT General Charges $OT Visit: 1 Visit OT Evaluation $OT Eval Moderate Complexity: 1 [address] OTR/L   Adella Hare 04/22/2020, 4:04 PM

## 2020-04-23 DIAGNOSIS — I639 Cerebral infarction, unspecified: Secondary | ICD-10-CM

## 2020-04-23 LAB — GLUCOSE, CAPILLARY
Glucose-Capillary: 127 mg/dL — ABNORMAL HIGH (ref 70–99)
Glucose-Capillary: 145 mg/dL — ABNORMAL HIGH (ref 70–99)
Glucose-Capillary: 150 mg/dL — ABNORMAL HIGH (ref 70–99)
Glucose-Capillary: 152 mg/dL — ABNORMAL HIGH (ref 70–99)
Glucose-Capillary: 160 mg/dL — ABNORMAL HIGH (ref 70–99)
Glucose-Capillary: 162 mg/dL — ABNORMAL HIGH (ref 70–99)
Glucose-Capillary: 171 mg/dL — ABNORMAL HIGH (ref 70–99)

## 2020-04-23 LAB — BASIC METABOLIC PANEL
Anion gap: 15 (ref 5–15)
BUN: 16 mg/dL (ref 8–23)
CO2: 21 mmol/L — ABNORMAL LOW (ref 22–32)
Calcium: 9.2 mg/dL (ref 8.9–10.3)
Chloride: 103 mmol/L (ref 98–111)
Creatinine, Ser: 0.83 mg/dL (ref 0.44–1.00)
GFR calc Af Amer: >60 mL/min (ref >60)
GFR calc non Af Amer: >60 mL/min (ref >60)
Glucose, Bld: 157 mg/dL — ABNORMAL HIGH (ref 70–99)
Potassium: 3.3 mmol/L — ABNORMAL LOW (ref 3.5–5.1)
Sodium: 139 mmol/L (ref 135–145)

## 2020-04-23 MED ORDER — DILTIAZEM HCL 25 MG/5ML IV SOLN
10.0000 mg | Freq: Once | INTRAVENOUS | Status: AC
  Administered 2020-04-23: 10 mg via INTRAVENOUS
  Filled 2020-04-23: qty 5

## 2020-04-23 MED ORDER — RESOURCE THICKENUP CLEAR PO POWD
ORAL | Status: DC | PRN
  Filled 2020-04-23: qty 125

## 2020-04-23 MED ORDER — POTASSIUM CHLORIDE 20 MEQ/15ML (10%) PO SOLN
40.0000 meq | Freq: Once | ORAL | Status: AC
  Administered 2020-04-23: 40 meq
  Filled 2020-04-23: qty 30

## 2020-04-23 MED ORDER — ORAL CARE MOUTH RINSE
15.0000 mL | Freq: Two times a day (BID) | OROMUCOSAL | Status: DC
  Administered 2020-04-23 – 2020-04-30 (×13): 15 mL via OROMUCOSAL

## 2020-04-23 MED ORDER — CHLORHEXIDINE GLUCONATE 0.12 % MT SOLN
15.0000 mL | Freq: Two times a day (BID) | OROMUCOSAL | Status: DC
  Administered 2020-04-23 – 2020-04-30 (×15): 15 mL via OROMUCOSAL
  Filled 2020-04-23 (×15): qty 15

## 2020-04-23 NOTE — Progress Notes (Signed)
STROKE TEAM PROGRESS NOTE   INTERVAL HISTORY Patient is sitting up in bed.  She remains globally aphasic.  She has a pended tube for feeding.  She can follow a few midline commands and mumbles a few words which are nonsensical.  Remains plegic on the right.  Vital signs stable.  Transthoracic echo and lower extremity venous Dopplers are both normal Vitals:   04/23/20 1023 04/23/20 1147 04/23/20 1202 04/23/20 1225  BP: (!) 119/86  (!) 129/82   Pulse: (!) 116  (!) 107   Resp: 20     Temp: 98.5 F (36.9 C) 100.2 F (37.9 C)  99.4 F (37.4 C)  TempSrc: Axillary Axillary  Axillary  SpO2: 97%     Weight:       CBC:  Recent Labs  Lab 04/21/20 1749  WBC 5.1  NEUTROABS 3.7  HGB 13.0  HCT 39.0  MCV 88.8  PLT 305   Basic Metabolic Panel:  Recent Labs  Lab 04/21/20 1749 04/23/20 0443  NA 137 139  K 4.0 3.3*  CL 102 103  CO2 19* 21*  GLUCOSE 128* 157*  BUN 7* 16  CREATININE 0.70 0.83  CALCIUM 9.2 9.2   Lipid Panel:  Recent Labs  Lab 04/22/20 0338  CHOL 203*  TRIG 69  HDL 62  CHOLHDL 3.3  VLDL 14  LDLCALC 811*   HgbA1c:  Recent Labs  Lab 04/22/20 0338  HGBA1C 6.1*   Urine Drug Screen:  Recent Labs  Lab 04/21/20 2000  LABOPIA NONE DETECTED  COCAINSCRNUR NONE DETECTED  LABBENZ NONE DETECTED  AMPHETMU NONE DETECTED  THCU POSITIVE*  LABBARB NONE DETECTED    Alcohol Level  Recent Labs  Lab 04/21/20 1749  ETH <10    IMAGING past 24 hours VAS [place] LOWER EXTREMITY VENOUS (DVT)  Result Date: 04/22/2020  Lower Venous DVTStudy Indications: Stroke.  Comparison Study: no prior Performing Technologist: [name] RVS  Examination Guidelines: A complete evaluation includes B-mode imaging, spectral Doppler, color Doppler, and power Doppler as needed of all accessible portions of each vessel. Bilateral testing is considered an integral part of a complete examination. Limited examinations for reoccurring indications may be performed as noted. The reflux portion of the  exam is performed with the patient in reverse Trendelenburg.  +---------+---------------+---------+-----------+----------+--------------+ RIGHT    CompressibilityPhasicitySpontaneityPropertiesThrombus Aging +---------+---------------+---------+-----------+----------+--------------+ CFV      Full           Yes      Yes                                 +---------+---------------+---------+-----------+----------+--------------+ SFJ      Full                                                        +---------+---------------+---------+-----------+----------+--------------+ FV Prox  Full                                                        +---------+---------------+---------+-----------+----------+--------------+ FV Mid   Full                                                        +---------+---------------+---------+-----------+----------+--------------+  FV DistalFull                                                        +---------+---------------+---------+-----------+----------+--------------+ PFV      Full                                                        +---------+---------------+---------+-----------+----------+--------------+ POP      Full           Yes      Yes                                 +---------+---------------+---------+-----------+----------+--------------+ PTV      Full                                                        +---------+---------------+---------+-----------+----------+--------------+ PERO                                                  Not visualized +---------+---------------+---------+-----------+----------+--------------+   +---------+---------------+---------+-----------+----------+--------------+ LEFT     CompressibilityPhasicitySpontaneityPropertiesThrombus Aging +---------+---------------+---------+-----------+----------+--------------+ CFV      Full           Yes      Yes                                  +---------+---------------+---------+-----------+----------+--------------+ SFJ      Full                                                        +---------+---------------+---------+-----------+----------+--------------+ FV Prox  Full                                                        +---------+---------------+---------+-----------+----------+--------------+ FV Mid   Full                                                        +---------+---------------+---------+-----------+----------+--------------+ FV DistalFull                                                        +---------+---------------+---------+-----------+----------+--------------+  PFV      Full                                                        +---------+---------------+---------+-----------+----------+--------------+ POP      Full           Yes      Yes                                 +---------+---------------+---------+-----------+----------+--------------+ PTV      Full                                                        +---------+---------------+---------+-----------+----------+--------------+ PERO                                                  Not visualized +---------+---------------+---------+-----------+----------+--------------+     Summary: BILATERAL: - No evidence of deep vein thrombosis seen in the lower extremities, bilaterally. - No evidence of superficial venous thrombosis in the lower extremities, bilaterally. -No evidence of popliteal cyst, bilaterally.   *See table(s) above for measurements and observations. Electronically signed by Fabienne Bruns MD on 04/22/2020 at 6:11:35 PM.    Final     PHYSICAL EXAM Pleasant middle-aged African-American lady not in distress. . Afebrile. Head is nontraumatic. Neck is supple without bruit.    Cardiac exam no murmur or gallop. Lungs are clear to auscultation. Distal pulses are well felt. Neurological Exam  : Patient is awake alert.  She is globally aphasic.  She speaks only occasional words which are mostly nonsensical.  She can follows some commands to pantomime and occasional midline.  Unable to name repeat or comprehend.  Left gaze preference but can look to the right past midline.  Does not blink to threat on the right but does so on the left.  Right lower facial weakness.  Tongue midline.  Dense right hemiplegia with only trace withdrawal to painful stimuli on the right and purposeful antigravity movements on the left.  Tone is diminished on the right and normal on the left right plantar upgoing left downgoing.  Gait not tested. ASSESSMENT/PLAN [name] is a 64 y.o. female with history of HTN presenting with slurred speech and right-sided weakness.   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 Mild L lateral ventricle mass effect w/ 33mm midline shift. ASPECTS 4    CTA head & neck L MCA infarct. L M2 occlusion.   MRI  L MCA infarct w/ mild hemorrhagic transformation   Repeat CT head evolving L MCA infarct w/ stable hemorrhage   2D Echo pending   LE doppler pending - given recent airline travel  LDL 127  HgbA1c 6.1  VTE prophylaxis - added Lovenox 40 mg sq daily (ok in setting of hemorrhagic transformation)  No antithrombotic prior to admission, now on No  antithrombotic given hemorrhagic transformation. Ok for aspirin (not ok for [facility]). Will add.     Therapy recommendations:  SLP  Disposition:  pending   Atrial Fibrillation w/ RVR, new dx  CHA2DS2-VASc Score = at least 5, ?2 oral anticoagulation recommended  Age in Years:  <65   0    Sex:  Female   Female   +1    Hypertension History:  yes   +1     Diabetes Mellitus:  yes   +1  Congestive Heart Failure History:  0  Vascular Disease History:  0    Stroke/TIA/Thromboembolism History:  yes   +2 . Not an AC  candidate at this time given hemorrhagic transformation of infarct   Hypertension  Home meds:  norvasc 10, atenolol 25  Stable . Permissive hypertension (OK if < 220/120) but gradually normalize in 5-7 days . Long-term BP goal normotensive  Hyperlipidemia  Home meds:  No statin  On lipitor 40  LDL 127, goal < 70  Continue statin at discharge  PreDiabetes   HgbA1c 6.1, goal < 7.0  CBGs Recent Labs    04/23/20 0400 04/23/20 0744 04/23/20 1151  GLUCAP 162* 160* 171*      SSI  Dysphagia . Secondary to stroke . NPO . Speech on board   Other Stroke Risk Factors  Former Cigarette smoker, quit 41 yrs ago  ETOH use, alcohol level <10, advised to drink no more than 1 drink(s) a day. On CIWA protocol  Substance abuse - UDS:  THC POSITIVE, Patient advised to stop using due to stroke risk.  Hospital day # 2  She presented with aphasia and right hemiplegia he presented to late to be considered for thrombolysis or intervention.  She has significant global aphasia and right hemiplegia and prognosis is guarded.   Continue pended tube feeds for now and speech therapy to follow swallow eval. continue aspirin for stroke prevention for now.  She will need to be started on anticoagulation after 3 to 5 days when her hemorrhagic transformation remained stable repeat CT scan of the head without contrast tomorrow morning..  Discussed with Dr. Jerral Ralph.  Greater than 50% time during this 25-minute visit was spent on counseling and coordination of care and answering questions Delia Heady, MD To contact Stroke Continuity provider, please refer to [email or web address]. After hours, contact General Neurology

## 2020-04-23 NOTE — Progress Notes (Signed)
   04/22/20 1946  Assess: MEWS Score  Temp 98.1 F (36.7 C)  BP (!) 125/113  Pulse Rate (!) 106  ECG Heart Rate (!) 108  Resp 20  Level of Consciousness Alert  SpO2 97 %  O2 Device Room Air  Patient Activity (if Appropriate) In bed  Assess: MEWS Score  MEWS Temp 0  MEWS Systolic 0  MEWS Pulse 1  MEWS RR 0  MEWS LOC 0  MEWS Score 1  MEWS Score Color Green  Assess: if the MEWS score is Yellow or Red  Were vital signs taken at a resting state? Yes  Focused Assessment No change from prior assessment  Early Detection of Sepsis Score *See Row Information* Low  MEWS guidelines implemented *See Row Information* No, other (Comment) (no acute changes)  Treat  Pain Scale 0-10  Pain Score 0  Document  Progress note created (see row info) Yes

## 2020-04-23 NOTE — Progress Notes (Signed)
°  Speech Language Pathology Treatment: Dysphagia;Cognitive-Linquistic  Patient Details Name: Lindsey [name] MRN: [id number] DOB: 05/28/1956 Today's Date: 04/23/2020 Time: 9518-8416 SLP Time Calculation (min) (ACUTE ONLY): 23 min  Assessment / Plan / Recommendation Clinical Impression  Pt fully alert and attentive to therapist. Attempts to follow most commands, but there is perseverative groping with proximal and distal commands. Pt can stick out her tongue correctly without visual cue, but then repeatedly sticks out her tongue with all further motor commands unless a visual cue is given. When asked to show two fingers, pt needed a visual cue to terminate an incorrect groping movement with her hand, but then was able to accurtely show correct numbers on her finger in 10/10 trials with verbal command only. She can respond "yes" verbally to name with enthusiasm, but then nods yes incorrectly to other basic questions. Corrected to no with visual cueing. Overall, pt appears primarily apraxic, but a mixed component of aphasia is also possible. She is starting to repeat words and phrases with difficulty approximating correct articulatory placement.   Pt is much more automatic with oral intake of POs. She demonstrates immediate sensation of aspiration with cough due to poor oral coordination with a teaspoon of thin liquids, but consumed straw sips of nectar thick liquids and spoons of puree well. One cough noted when pt was unable to stop sipping and consumed several oz consecutively. Will initiate a puree/nectar diet today and f/u for potential advancement or need for MBS tomorrow. Hopeful for adequate intake and eventual removal of Cortrak in a day or two.    HPI HPI: Lindsey [name] is a 64 y.o. female with past medical history significant for hypertension presents emergency department with slurred speech and right-sided weakness noted by family today. Last seen normal by her daughter was 4:30 PM yesterday.  The  daughter works evening shift and prior to her leaving patient was drinking.  This morning patient did not get up, the granddaughter noticed patient speech was gibberish but thought this was due to her being intoxicated.  Patient was in bed all day and could not getting words out and daughter finally decided to call EMS.  Patient did not arrive as a code stroke as she was outside the 24-hour window. On arrival patient had left gaze deviation, right-sided weakness and was aphasic and severely dysarthric following only some commands.  Stat CT head was obtained which showed large acute left MCA stroke, CTA confirmed a left M2 occlusion.  Unfortunately patient not a candidate for thrombectomy due to being outside the window and this is an established infarct.      SLP Plan  Continue with current plan of care       Recommendations  Diet recommendations: Dysphagia 1 (puree);Nectar-thick liquid Liquids provided via: Straw;Cup Medication Administration: Whole meds with puree Supervision: Staff to assist with self feeding;Full supervision/cueing for compensatory strategies Compensations: Slow rate;Small sips/bites;Minimize environmental distractions Postural Changes and/or Swallow Maneuvers: Seated upright 90 degrees                General recommendations: Rehab consult Oral Care Recommendations: Oral care BID Follow up Recommendations: Inpatient Rehab SLP Visit Diagnosis: Aphasia (R47.01);Dysphagia, oropharyngeal phase (R13.12) Plan: Continue with current plan of care       GO               Harlon Ditty, MA CCC-SLP  Acute Rehabilitation Services Pager [phone] Office [phone]  Claudine Mouton 04/23/2020, 10:32 AM

## 2020-04-23 NOTE — Progress Notes (Signed)
Inpatient Rehabilitation Admissions Coordinator  I have left a message for pt's daughter to contact me to discuss goals, expectations and possible rehab venue options.  Ottie Glazier, RN, MSN Rehab Admissions Coordinator [phone] 04/23/2020 4:13 PM

## 2020-04-23 NOTE — Plan of Care (Signed)
  Problem: Education: Goal: Knowledge of disease or condition will improve Outcome: Progressing Goal: Knowledge of secondary prevention will improve Outcome: Progressing Goal: Knowledge of patient specific risk factors addressed and post discharge goals established will improve Outcome: Progressing Goal: Individualized Educational Video(s) Outcome: Progressing   Problem: Coping: Goal: Will verbalize positive feelings about self Outcome: Progressing Goal: Will identify appropriate support needs Outcome: Progressing   Problem: Self-Care: Goal: Verbalization of feelings and concerns over difficulty with self-care will improve Outcome: Progressing Goal: Ability to communicate needs accurately will improve Outcome: Progressing   Problem: Intracerebral Hemorrhage Tissue Perfusion: Goal: Complications of Intracerebral Hemorrhage will be minimized Outcome: Progressing   Problem: Education: Goal: Knowledge of General Education information will improve Description: Including pain rating scale, medication(s)/side effects and non-pharmacologic comfort measures Outcome: Progressing   Problem: Health Behavior/Discharge Planning: Goal: Ability to manage health-related needs will improve Outcome: Progressing   Problem: Clinical Measurements: Goal: Ability to maintain clinical measurements within normal limits will improve Outcome: Progressing Goal: Will remain free from infection Outcome: Progressing Goal: Diagnostic test results will improve Outcome: Progressing Goal: Respiratory complications will improve Outcome: Progressing Goal: Cardiovascular complication will be avoided Outcome: Progressing   Problem: Activity: Goal: Risk for activity intolerance will decrease Outcome: Progressing   Problem: Nutrition: Goal: Adequate nutrition will be maintained Outcome: Progressing   Problem: Coping: Goal: Level of anxiety will decrease Outcome: Progressing   Problem:  Elimination: Goal: Will not experience complications related to bowel motility Outcome: Progressing Goal: Will not experience complications related to urinary retention Outcome: Progressing   Problem: Pain Managment: Goal: General experience of comfort will improve Outcome: Progressing   Problem: Safety: Goal: Ability to remain free from injury will improve Outcome: Progressing   Problem: Skin Integrity: Goal: Risk for impaired skin integrity will decrease Outcome: Progressing

## 2020-04-23 NOTE — Progress Notes (Addendum)
PROGRESS NOTE        PATIENT DETAILS Name: Lindsey [name] Age: 64 y.o. Sex: female Date of Birth: Mar 11, 1956 Admit Date: 04/21/2020 Admitting Physician Anselm Jungling, DO ZOX:WRUEAV, Ngwe A, MD  Brief Narrative: Patient is a 64 y.o. female with history of HTN, EtOH use-who presented with right-sided hemiparesis, dysarthria-found to have acute CVA and A. fib with RVR.  Significant events: 7/20>> admit for acute CVA and A. fib with RVR  Significant studies: 7/21>> LDL: 127 7/21>> A1c: 6.1 7/21>> echo: Pending 7/21>> CT head: Evolving left MCA infarct with unchanged small areas of hemorrhage. 7/20>> MRI brain: Acute infarct left MCA territory-mild hemorrhage in the infarct 7/20>> CTA head/neck: Acute occlusion of the inferior division of left M2, no other intracranial/extracranial stenosis.  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 fib.  See work-up results above.  Neurology following-continue ASA for now-given hemorrhagic transformation not a candidate for anticoagulation currently.  Neurology planning on repeating CT head-before determining timing of starting anticoagulation.  Atrial fibrillation with RVR: Rate better controlled-continue metoprolol.  Cardiology following.  Not a candidate for anticoagulation currently due to hemorrhagic transformation.    Dysphagia: Secondary to CVA-NG tube placed yesterday-discussed with SLP today-to start on dysphagia 1 diet-will be reassessed tomorrow-to see if we can discontinue NG tube.  HTN: BP  stable-continue metoprolol.  HLD: Continue statin  Hypokalemia: Replete and recheck-we will check magnesium levels in a.m. as well.  EtOH abuse: Per daughter-patient does not drink every day-apparently has gone several days without withdrawal symptoms when not drinking.  Watch closely for withdrawal symptoms-currently none.  Diet: Diet Order            DIET - DYS 1 Room service appropriate? Yes; Fluid consistency: Nectar Thick  Diet effective now                  Code Status: Full code   Family Communication: Daughter at bedside  Disposition Plan: Status is: Inpatient  Remains inpatient appropriate because:Inpatient level of care appropriate due to severity of illness   Dispo: The patient is from: Home              Anticipated d/c is to: [facility]              Anticipated d/c date is: 2 days              Patient currently is not medically stable to d/c.  Barriers to Discharge: Acute CVA with dense right-sided hemiplegia/dysphagia-work-up in progress  Antimicrobial agents: Anti-infectives (From admission, onward)   None       Time spent: 25 minutes-Greater than 50% of this time was spent in counseling, explanation of diagnosis, planning of further management, and coordination of care.  MEDICATIONS: Scheduled Meds: . aspirin  300 mg Rectal Daily   Or  . aspirin  81 mg Per Tube Daily  . atorvastatin  40 mg Per Tube q1800  . chlorhexidine  15 mL Mouth Rinse BID  . enoxaparin (LOVENOX) injection  40 mg Subcutaneous Q24H  . feeding supplement (  OSMOLITE 1.5 CAL)  1,000 mL Per Tube Q24H  . feeding supplement (PROSource TF)  45 mL Per Tube BID  . mouth rinse  15 mL Mouth Rinse q12n4p  . metoprolol tartrate  25 mg Per Tube BID   Continuous Infusions: PRN Meds:.acetaminophen **OR** acetaminophen (TYLENOL) oral liquid 160 mg/5 mL **OR** acetaminophen, Resource ThickenUp Clear   PHYSICAL EXAM: Vital signs: Vitals:   04/23/20 1147 04/23/20 1202 04/23/20 1225 04/23/20  1330  BP:  (!) 129/82    Pulse:  (!) 107    Resp:      Temp: 100.2 F (37.9 C)  99.4 F (37.4 C) 98.6 F (37 C)  TempSrc: Axillary  Axillary Axillary  SpO2:      Weight:       Filed Weights   04/22/20 0100  Weight: 75.8 kg   Body mass index is 27.81 kg/m.   Gen Exam:Alert awake-dysarthria has improved-speech more clear today. HEENT:atraumatic, normocephalic Chest: B/L clear to auscultation anteriorly CVS:S1S2 irregular  abdomen:soft non tender, non distended Extremities:no edema Neurology: Dense right hemiplegia persists. Skin: no rash  I have personally reviewed following labs and imaging studies  LABORATORY DATA: CBC: Recent Labs  Lab 04/21/20 1749  WBC 5.1  NEUTROABS 3.7  HGB 13.0  HCT 39.0  MCV 88.8  PLT 305    Basic Metabolic Panel: Recent Labs  Lab 04/21/20 1749 04/23/20 0443  NA 137 139  K 4.0 3.3*  CL 102 103  CO2 19* 21*  GLUCOSE 128* 157*  BUN 7* 16  CREATININE 0.70 0.83  CALCIUM 9.2 9.2    GFR: CrCl cannot be calculated (Unknown ideal weight.).  Liver Function Tests: Recent Labs  Lab 04/21/20 1749  AST 56*  ALT 38  ALKPHOS 79  BILITOT 1.4*  PROT 8.4*  ALBUMIN 3.8   No results for input(s): LIPASE, AMYLASE in the last 168 hours. No results for input(s): AMMONIA in the last 168 hours.  Coagulation Profile: Recent Labs  Lab 04/21/20 1749  INR 1.1    Cardiac Enzymes: No results for input(s): CKTOTAL, CKMB, CKMBINDEX, TROPONINI in the last 168 hours.  BNP (last 3 results) No results for input(s): PROBNP in the last 8760 hours.  Lipid Profile: Recent Labs    04/22/20 0338  CHOL 203*  HDL 62  LDLCALC 127*  TRIG 69  CHOLHDL 3.3    Thyroid Function Tests: No results for input(s): TSH, T4TOTAL, FREET4, T3FREE, THYROIDAB in the last 72 hours.  Anemia Panel: No results for input(s): VITAMINB12, FOLATE, FERRITIN, TIBC, IRON, RETICCTPCT in the last 72 hours.  Urine analysis:    Component Value Date/Time    COLORURINE YELLOW 04/21/2020 2000   APPEARANCEUR CLEAR 04/21/2020 2000   APPEARANCEUR Clear 08/17/2014 0948   LABSPEC 1.042 (H) 04/21/2020 2000   LABSPEC 1.018 08/17/2014 0948   PHURINE 6.0 04/21/2020 2000   GLUCOSEU NEGATIVE 04/21/2020 2000   GLUCOSEU Negative 08/17/2014 0948   HGBUR NEGATIVE 04/21/2020 2000   BILIRUBINUR NEGATIVE 04/21/2020 2000   BILIRUBINUR Negative 08/17/2014 0948   KETONESUR 20 (A) 04/21/2020 2000   PROTEINUR NEGATIVE 04/21/2020 2000   NITRITE NEGATIVE 04/21/2020 2000   LEUKOCYTESUR NEGATIVE 04/21/2020 2000   LEUKOCYTESUR Negative 08/17/2014 0948    Sepsis Labs: Lactic Acid, Venous No results found for: LATICACIDVEN  MICROBIOLOGY: Recent Results (from the past 240 hour(s))  SARS Coronavirus 2 by RT PCR (hospital order, performed in [facility] hospital lab) Nasopharyngeal Nasopharyngeal Swab     Status: None   Collection Time: 04/21/20  6:51 PM   Specimen: Nasopharyngeal Swab  Result Value Ref Range Status   SARS Coronavirus 2 NEGATIVE NEGATIVE Final    Comment: (NOTE) SARS-CoV-2 target nucleic acids are NOT DETECTED.  The SARS-CoV-2 RNA is generally detectable in upper and lower respiratory specimens during the acute phase of infection. The lowest concentration of SARS-CoV-2 viral copies this assay can detect is 250 copies / mL. A negative result does not preclude SARS-CoV-2 infection and should not be used as the sole basis for treatment or other patient management decisions.  A negative result may occur with improper specimen collection / handling, submission of specimen other than nasopharyngeal swab, presence of viral mutation(s) within the areas targeted by this assay, and inadequate number of viral copies (<250 copies / mL). A negative result must be combined with clinical observations, patient history, and epidemiological information.  Fact Sheet for Patients:   BoilerBrush.com.cy  Fact Sheet for Healthcare  Providers: https://pope.com/  This test is not yet approved or  cleared by the [place] FDA and has been authorized for detection and/or diagnosis of SARS-CoV-2 by FDA under an Emergency Use Authorization (EUA).  This EUA will remain in effect (meaning this test can be used) for the duration of the COVID-19 declaration under Section 564(b)(1) of the Act, 21 U.S.C. section 360bbb-3(b)(1), unless the authorization is terminated or revoked sooner.  Performed at [facility], [address], [place], [place] [address]   Culture, blood (Routine X 2) w Reflex to ID Panel     Status: None (Preliminary result)   Collection Time: 04/21/20  8:25 PM   Specimen: BLOOD RIGHT HAND  Result Value Ref Range Status   Specimen Description BLOOD RIGHT HAND  Final   Special Requests   Final    BOTTLES DRAWN AEROBIC AND ANAEROBIC Blood Culture adequate volume   Culture   Final    NO GROWTH < 12 HOURS Performed at [name], [address] [place], [place] [address]    Report Status PENDING  Incomplete  MRSA PCR Screening     Status: None   Collection Time: 04/22/20  1:17 AM   Specimen: Nasal Mucosa; Nasopharyngeal  Result Value Ref Range Status   MRSA by PCR NEGATIVE NEGATIVE Final    Comment:        The GeneXpert MRSA Assay (FDA approved for NASAL specimens only), is one component of a comprehensive MRSA colonization surveillance program. It is not intended to diagnose MRSA infection nor to guide or monitor treatment for MRSA infections. Performed at [facility], [address], [place], [place] [address]     RADIOLOGY STUDIES/RESULTS: CT Angio Head W or Wo Contrast  Result Date: 04/21/2020 CLINICAL DATA:  Stroke.  Slurred speech EXAM: CT ANGIOGRAPHY HEAD AND NECK TECHNIQUE: Multidetector CT imaging of the head and neck was performed using the standard protocol during bolus administration of intravenous contrast. Multiplanar CT image  reconstructions and MIPs were obtained to evaluate the vascular anatomy. Carotid stenosis measurements (when applicable) are obtained utilizing NASCET criteria, using the distal internal carotid diameter as the denominator. CONTRAST:  75mL OMNIPAQUE IOHEXOL 350 MG/ML SOLN COMPARISON:  CT head 04/21/2020 FINDINGS: CTA NECK FINDINGS Aortic arch: Mild atherosclerotic calcification aortic arch. Bovine branching pattern. Proximal great vessels patent. Right carotid system: Right carotid widely patent without stenosis. Minimal atherosclerotic calcification right carotid bulb. Medial deviation of the carotid bifurcation and internal carotid artery posterior to the pharynx. Left carotid system: Left carotid widely patent  with mild atherosclerotic calcification at the bifurcation. Medial deviation of the carotid bifurcation internal carotid artery in the retropharyngeal space. Vertebral arteries: Left vertebral artery dominant. Left vertebral artery patent to the basilar without stenosis. Small right vertebral artery without stenosis. Skeleton: No acute skeletal abnormality. Other neck: Negative for mass or adenopathy in the neck. Upper chest: Lung apices clear bilaterally. Superior mediastinum negative. Review of the MIP images confirms the above findings CTA HEAD FINDINGS Anterior circulation: Internal carotid artery widely patent bilaterally. Left M1 segment widely patent. There is occlusion of the proximal left M2 inferior division which appears acute. This corresponds to low-density on CT compatible with acute infarct. Superior division left MCA patent Both anterior cerebral arteries widely patent. Right middle cerebral artery and branches widely patent. Posterior circulation: Both vertebral arteries patent to the basilar. PICA patent bilaterally. Basilar widely patent. Superior cerebellar and posterior cerebral arteries patent bilaterally without stenosis or occlusion. Venous sinuses: Early contrast enhancement of the  dural sinuses not well evaluated. Anatomic variants: None Review of the MIP images confirms the above findings IMPRESSION: 1. 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 Acute occlusion inferior division left M2  . 3. No other intracranial stenosis 4. No extracranial stenosis in the carotid or vertebral arteries. 5. These results were called by telephone at the time of interpretation on 04/21/2020 at 6:59 pm to provider JOSHUA LONG , who verbally acknowledged these results. Electronically Signed   By: Marlan Palau M.D.   On: 04/21/2020 19:00   CT HEAD WO CONTRAST  Result Date: 04/22/2020 CLINICAL DATA:  Stroke follow-up. EXAM: CT HEAD WITHOUT CONTRAST TECHNIQUE: Contiguous axial images were obtained from the base of the skull through the vertex without intravenous contrast. COMPARISON:  Head CT, CTA, and MRI 04/21/2020 FINDINGS: Brain: An evolving moderately large acute left MCA infarct is similar in extent to the prior MRI with increasing cytotoxic edema. There is partial effacement of the left lateral ventricle with at most trace midline shift. Multiple small areas of hemorrhage within the infarct are similar to the MRI. There is no extra-axial fluid collection. There is a background of mild chronic small vessel ischemia in the cerebral white matter bilaterally. Vascular: Calcified atherosclerosis at the skull base. Hyperdense left MCA branch vessel corresponding to the M2 occlusion on CTA. Skull: No fracture or suspicious osseous lesion. Sinuses/Orbits: Opacification of a single right ethmoid air cell. Clear mastoid air cells. Unremarkable orbits. Other: None. IMPRESSION: Evolving left MCA infarct with unchanged small areas of hemorrhage. Electronically Signed   By: Sebastian Ache M.D.   On: 04/22/2020 07:47   CT Head Wo Contrast  Result Date: 04/21/2020 CLINICAL DATA:  Stroke suspected, slurred speech, right-sided weakness and facial droop EXAM: CT HEAD WITHOUT CONTRAST TECHNIQUE: Contiguous axial images  were obtained from the base of the skull through the vertex without intravenous contrast. COMPARISON:  None. FINDINGS: Brain: There is a hypodensity of the anterior left MCA territory centered about the insula and frontal operculum, involving the left basal ganglia, particularly the putamen and internal capsule, as well as the high left MCA territory cortex. There is no involvement of the temporal lobe. No evidence of hemorrhage. There is mild mass effect on the left lateral ventricle with approximately 3 mm left right midline shift. Vascular: Subtly hyperdense M2 segmental vessel in the left insula (series 2, image 12). Skull: Normal. Negative for fracture or focal lesion. Sinuses/Orbits: No acute finding. Other: None. IMPRESSION: 1. There is a large hypodensity of the anterior  left MCA territory, findings consistent with acute to subacute left MCA territory infarction. ASPECTS 4. 2.  No evidence of hemorrhage. 3. There is mild mass effect on the left lateral ventricle with approximately 3 mm left right midline shift. Findings reported by telephone to Dr. Jacqulyn BathLong at the time of interpretation, 6:48 p.m., 04/21/2020 Electronically Signed   By: Lauralyn PrimesAlex  Bibbey M.D.   On: 04/21/2020 18:53   CT Angio Neck W and/or Wo Contrast  Result Date: 04/21/2020 CLINICAL DATA:  Stroke.  Slurred speech EXAM: CT ANGIOGRAPHY HEAD AND NECK TECHNIQUE: Multidetector CT imaging of the head and neck was performed using the standard protocol during bolus administration of intravenous contrast. Multiplanar CT image reconstructions and MIPs were obtained to evaluate the vascular anatomy. Carotid stenosis measurements (when applicable) are obtained utilizing NASCET criteria, using the distal internal carotid diameter as the denominator. CONTRAST:  75mL OMNIPAQUE IOHEXOL 350 MG/ML SOLN COMPARISON:  CT head 04/21/2020 FINDINGS: CTA NECK FINDINGS Aortic arch: Mild atherosclerotic calcification aortic arch. Bovine branching pattern. Proximal great  vessels patent. Right carotid system: Right carotid widely patent without stenosis. Minimal atherosclerotic calcification right carotid bulb. Medial deviation of the carotid bifurcation and internal carotid artery posterior to the pharynx. Left carotid system: Left carotid widely patent with mild atherosclerotic calcification at the bifurcation. Medial deviation of the carotid bifurcation internal carotid artery in the retropharyngeal space. Vertebral arteries: Left vertebral artery dominant. Left vertebral artery patent to the basilar without stenosis. Small right vertebral artery without stenosis. Skeleton: No acute skeletal abnormality. Other neck: Negative for mass or adenopathy in the neck. Upper chest: Lung apices clear bilaterally. Superior mediastinum negative. Review of the MIP images confirms the above findings CTA HEAD FINDINGS Anterior circulation: Internal carotid artery widely patent bilaterally. Left M1 segment widely patent. There is occlusion of the proximal left M2 inferior division which appears acute. This corresponds to low-density on CT compatible with acute infarct. Superior division left MCA patent Both anterior cerebral arteries widely patent. Right middle cerebral artery and branches widely patent. Posterior circulation: Both vertebral arteries patent to the basilar. PICA patent bilaterally. Basilar widely patent. Superior cerebellar and posterior cerebral arteries patent bilaterally without stenosis or occlusion. Venous sinuses: Early contrast enhancement of the dural sinuses not well evaluated. Anatomic variants: None Review of the MIP images confirms the above findings IMPRESSION: 1. 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 Acute occlusion inferior division left M2  . 3. No other intracranial stenosis 4. No extracranial stenosis in the carotid or vertebral arteries. 5. These results were called by telephone at the time of interpretation on 04/21/2020 at 6:59 pm to provider JOSHUA LONG ,  who verbally acknowledged these results. Electronically Signed   By: Marlan Palauharles  Clark M.D.   On: 04/21/2020 19:00   MR BRAIN WO CONTRAST  Result Date: 04/21/2020 CLINICAL DATA:  Stroke slurred speech EXAM: MRI HEAD WITHOUT CONTRAST TECHNIQUE: Multiplanar, multiecho pulse sequences of the brain and surrounding structures were obtained without intravenous contrast. COMPARISON:  CT angio head neck 05/01/2020 FINDINGS: Brain: Acute infarct left MCA territory. Infarct involves the left frontal lobe, insula as well as the caudate and putamen. There are areas of hemorrhage within the caudate and putamen as well as in the left frontal lobe. These are not identified on CT. Scattered small white matter hyperintensities elsewhere compatible with chronic microvascular ischemia. Ventricle size normal. Negative for mass lesion. Vascular: Normal arterial flow voids Skull and upper cervical spine: No focal skeletal lesion Sinuses/Orbits: Negative Other: None IMPRESSION:  Acute infarct left MCA territory there is mild hemorrhage in the infarct which is not seen on recent CT earlier today. These results were called by telephone at the time of interpretation on 04/21/2020 at 9:52 pm to provider CHING TU , who verbally acknowledged these results. Electronically Signed   By: Marlan Palau M.D.   On: 04/21/2020 21:53   ECHOCARDIOGRAM COMPLETE  Result Date: 04/22/2020    ECHOCARDIOGRAM REPORT   Patient Name:   Guerry Bruin Date of Exam: 04/22/2020 Medical Rec #:  [id number]     Height:       65.0 in Accession #:    [id number]    Weight:       167.1 lb Date of Birth:  1955/12/11     BSA:          1.833 m Patient Age:    64 years      BP:           115/88 mmHg Patient Gender: F             HR:           114 bpm. Exam Location:  Inpatient Procedure: 2D Echo, Cardiac Doppler and Color Doppler Indications:    Stroke  History:        Patient has no prior history of Echocardiogram examinations.                 Stroke, Arrythmias:Atrial  Fibrillation; Risk                 Factors:Hypertension. ETOH.  Sonographer:    Sheralyn Boatman RDCS Referring Phys: [id number] CHING T TU  Sonographer Comments: Technically difficult study due to poor echo windows. Poor sound wave transmission. IMPRESSIONS  1. Left ventricular ejection fraction, by estimation, is 60 to 65%. The left ventricle has normal function. The left ventricle has no regional wall motion abnormalities. There is mild left ventricular hypertrophy. Left ventricular diastolic parameters are indeterminate.  2. Right ventricular systolic function is normal. The right ventricular size is normal. There is normal pulmonary artery systolic pressure. The estimated right ventricular systolic pressure is 27.6 mmHg.  3. Left atrial size was mild to moderately dilated.  4. Right atrial size was mildly dilated.  5. The mitral valve is normal in structure. No evidence of mitral valve regurgitation. No evidence of mitral stenosis.  6. The aortic valve is tricuspid. Aortic valve regurgitation is not visualized. No aortic stenosis is present.  7. The inferior vena cava is normal in size with greater than 50% respiratory variability, suggesting right atrial pressure of 3 mmHg. FINDINGS  Left Ventricle: Left ventricular ejection fraction, by estimation, is 60 to 65%. The left ventricle has normal function. The left ventricle has no regional wall motion abnormalities. The left ventricular internal cavity size was normal in size. There is  mild left ventricular hypertrophy. Left ventricular diastolic parameters are indeterminate. Right Ventricle: The right ventricular size is normal. No increase in right ventricular wall thickness. Right ventricular systolic function is normal. There is normal pulmonary artery systolic pressure. The tricuspid regurgitant velocity is 2.48 m/s, and  with an assumed right atrial pressure of 3 mmHg, the estimated right ventricular systolic pressure is 27.6 mmHg. Left Atrium: Left atrial size was  mild to moderately dilated. Right Atrium: Right atrial size was mildly dilated. Pericardium: Trivial pericardial effusion is present. Mitral Valve: The mitral valve is normal in structure. There is mild calcification of the mitral valve leaflet(s). Mild mitral annular  calcification. No evidence of mitral valve regurgitation. No evidence of mitral valve stenosis. Tricuspid Valve: The tricuspid valve is normal in structure. Tricuspid valve regurgitation is trivial. Aortic Valve: The aortic valve is tricuspid. Aortic valve regurgitation is not visualized. No aortic stenosis is present. Pulmonic Valve: The pulmonic valve was normal in structure. Pulmonic valve regurgitation is not visualized. Aorta: The aortic root is normal in size and structure. Venous: The inferior vena cava is normal in size with greater than 50% respiratory variability, suggesting right atrial pressure of 3 mmHg. IAS/Shunts: No atrial level shunt detected by color flow Doppler.  LEFT VENTRICLE PLAX 2D LVIDd:         3.85 cm LVIDs:         2.73 cm LV PW:         0.89 cm LV IVS:        1.40 cm LVOT diam:     1.70 cm LV SV:         38 LV SV Index:   21 LVOT Area:     2.27 cm  LV Volumes (MOD) LV vol d, MOD A2C: 52.1 ml LV vol d, MOD A4C: 48.9 ml LV vol s, MOD A2C: 21.7 ml LV vol s, MOD A4C: 17.7 ml LV SV MOD A2C:     30.4 ml LV SV MOD A4C:     48.9 ml LV SV MOD BP:      30.4 ml RIGHT VENTRICLE         IVC TAPSE (M-mode): 1.4 cm  IVC diam: 1.73 cm LEFT ATRIUM             Index       RIGHT ATRIUM           Index LA diam:        3.60 cm 1.96 cm/m  RA Area:     17.90 cm LA Vol (A2C):   75.4 ml 41.14 ml/m RA Volume:   53.70 ml  29.30 ml/m LA Vol (A4C):   53.8 ml 29.36 ml/m LA Biplane Vol: 64.1 ml 34.98 ml/m  AORTIC VALVE LVOT Vmax:   102.00 cm/s LVOT Vmean:  62.200 cm/s LVOT VTI:    0.168 m  AORTA Ao Root diam: 3.10 cm Ao Asc diam:  3.20 cm MITRAL VALVE                TRICUSPID VALVE MV Area (PHT): 4.28 cm     TR Peak grad:   24.6 mmHg MV Decel  Time: 177 msec     TR Vmax:        248.00 cm/s MV E velocity: 108.67 cm/s                             SHUNTS                             Systemic VTI:  0.17 m                             Systemic Diam: 1.70 cm Marca Ancona MD Electronically signed by Marca Ancona MD Signature Date/Time: 04/22/2020/4:34:41 PM    Final    VAS [place] LOWER EXTREMITY VENOUS (DVT)  Result Date: 04/22/2020  Lower Venous DVTStudy Indications: Stroke.  Comparison Study: no prior Performing Technologist: [name] RVS  Examination Guidelines: A complete evaluation includes  B-mode imaging, spectral Doppler, color Doppler, and power Doppler as needed of all accessible portions of each vessel. Bilateral testing is considered an integral part of a complete examination. Limited examinations for reoccurring indications may be performed as noted. The reflux portion of the exam is performed with the patient in reverse Trendelenburg.  +---------+---------------+---------+-----------+----------+--------------+ RIGHT    CompressibilityPhasicitySpontaneityPropertiesThrombus Aging +---------+---------------+---------+-----------+----------+--------------+ CFV      Full           Yes      Yes                                 +---------+---------------+---------+-----------+----------+--------------+ SFJ      Full                                                        +---------+---------------+---------+-----------+----------+--------------+ FV Prox  Full                                                        +---------+---------------+---------+-----------+----------+--------------+ FV Mid   Full                                                        +---------+---------------+---------+-----------+----------+--------------+ FV DistalFull                                                        +---------+---------------+---------+-----------+----------+--------------+ PFV      Full                                                         +---------+---------------+---------+-----------+----------+--------------+ POP      Full           Yes      Yes                                 +---------+---------------+---------+-----------+----------+--------------+ PTV      Full                                                        +---------+---------------+---------+-----------+----------+--------------+ PERO                                                  Not visualized +---------+---------------+---------+-----------+----------+--------------+   +---------+---------------+---------+-----------+----------+--------------+  LEFT     CompressibilityPhasicitySpontaneityPropertiesThrombus Aging +---------+---------------+---------+-----------+----------+--------------+ CFV      Full           Yes      Yes                                 +---------+---------------+---------+-----------+----------+--------------+ SFJ      Full                                                        +---------+---------------+---------+-----------+----------+--------------+ FV Prox  Full                                                        +---------+---------------+---------+-----------+----------+--------------+ FV Mid   Full                                                        +---------+---------------+---------+-----------+----------+--------------+ FV DistalFull                                                        +---------+---------------+---------+-----------+----------+--------------+ PFV      Full                                                        +---------+---------------+---------+-----------+----------+--------------+ POP      Full           Yes      Yes                                 +---------+---------------+---------+-----------+----------+--------------+ PTV      Full                                                         +---------+---------------+---------+-----------+----------+--------------+ PERO                                                  Not visualized +---------+---------------+---------+-----------+----------+--------------+     Summary: BILATERAL: - No evidence of deep vein thrombosis seen in the lower extremities, bilaterally. - No evidence of superficial venous thrombosis in the lower extremities, bilaterally. -No evidence of popliteal cyst, bilaterally.   *See table(s) above for measurements and observations. Electronically signed by Fabienne Bruns MD on 04/22/2020 at 6:11:35  PM.    Final      LOS: 2 days   Jeoffrey Massed, MD  [facility]    To contact the attending provider between 7A-7P or the covering provider during after hours 7P-7A, please log into the web site [email or web address] and access using universal [place] password for that web site. If you do not have the password, please call the hospital operator.  04/23/2020, 3:08 PM

## 2020-04-23 NOTE — Progress Notes (Signed)
Progress Note  Patient Name: Lindsey [name] Date of Encounter: 04/23/2020  Primary Cardiologist: No primary care provider on file.   Subjective   Sleeping  Inpatient Medications    Scheduled Meds: . aspirin  300 mg Rectal Daily   Or  . aspirin  81 mg Per Tube Daily  . atorvastatin  40 mg Per Tube q1800  . chlorhexidine  15 mL Mouth Rinse BID  . enoxaparin (LOVENOX) injection  40 mg Subcutaneous Q24H  . feeding supplement (OSMOLITE 1.5 CAL)  1,000 mL Per Tube Q24H  . feeding supplement (PROSource TF)  45 mL Per Tube BID  . mouth rinse  15 mL Mouth Rinse q12n4p  . metoprolol tartrate  25 mg Per Tube BID  . potassium chloride  40 mEq Per Tube Once   Continuous Infusions:  PRN Meds: acetaminophen **OR** acetaminophen (TYLENOL) oral liquid 160 mg/5 mL **OR** acetaminophen   Vital Signs    Vitals:   04/23/20 0516 04/23/20 0617 04/23/20 0716 04/23/20 0745  BP: (!) 143/86 (!) 143/81 (!) 136/100 115/88  Pulse: (!) 102 (!) 108 89 95  Resp: (!) Temp: 98.5 F (36.9 C)  98.2 F (36.8 C) 98.5 F (36.9 C)  TempSrc: Axillary  Axillary Axillary  SpO2: 97% 98% 98% 95%  Weight:        Intake/Output Summary (Last 24 hours) at 04/23/2020 0856 Last data filed at 04/23/2020 0336 Gross per 24 hour  Intake 264.5 ml  Output 300 ml  Net -35.5 ml   Filed Weights   04/22/20 0100  Weight: 75.8 kg    Telemetry    afib rates 90-110  - Personally Reviewed  ECG    No new - Personally Reviewed  Physical Exam  GEN: No acute distress.   Neck: No JVD Cardiac: iRRR, no murmurs, rubs, or gallops.  Respiratory: Clear to auscultation bilaterally. GI: Soft, nontender, non-distended  MS: No edema; No deformity. Neuro:  R hemiparesis, dysarthric Psych: dysarthria limits assessment, but calm   Labs    Chemistry Recent Labs  Lab 04/21/20 1749 04/23/20 0443  NA 137 139  K 4.0 3.3*  CL 102 103  CO2 19* 21*  GLUCOSE 128* 157*  BUN 7* 16  CREATININE 0.70 0.83    CALCIUM 9.2 9.2  PROT 8.4*  --   ALBUMIN 3.8  --   AST 56*  --   ALT 38  --   ALKPHOS 79  --   BILITOT 1.4*  --   GFRNONAA >60 >60  GFRAA >60 >60  ANIONGAP 16* 15     Hematology Recent Labs  Lab 04/21/20 1749  WBC 5.1  RBC 4.39  HGB 13.0  HCT 39.0  MCV 88.8  MCH 29.6  MCHC 33.3  RDW 14.8  PLT 305    Cardiac EnzymesNo results for input(s): TROPONINI in the last 168 hours. No results for input(s): TROPIPOC in the last 168 hours.   BNPNo results for input(s): BNP, PROBNP in the last 168 hours.   DDimer No results for input(s): DDIMER in the last 168 hours.   Radiology    CT Angio Head W or Wo Contrast  Result Date: 04/21/2020 CLINICAL DATA:  Stroke.  Slurred speech EXAM: CT ANGIOGRAPHY HEAD AND NECK TECHNIQUE: Multidetector CT imaging of the head and neck was performed using the standard protocol during bolus administration of intravenous contrast. Multiplanar CT image reconstructions and MIPs were obtained to evaluate the vascular anatomy. Carotid stenosis measurements (when applicable)  are obtained utilizing NASCET criteria, using the distal internal carotid diameter as the denominator. CONTRAST:  39mL OMNIPAQUE IOHEXOL 350 MG/ML SOLN COMPARISON:  CT head 04/21/2020 FINDINGS: CTA NECK FINDINGS Aortic arch: Mild atherosclerotic calcification aortic arch. Bovine branching pattern. Proximal great vessels patent. Right carotid system: Right carotid widely patent without stenosis. Minimal atherosclerotic calcification right carotid bulb. Medial deviation of the carotid bifurcation and internal carotid artery posterior to the pharynx. Left carotid system: Left carotid widely patent with mild atherosclerotic calcification at the bifurcation. Medial deviation of the carotid bifurcation internal carotid artery in the retropharyngeal space. Vertebral arteries: Left vertebral artery dominant. Left vertebral artery patent to the basilar without stenosis. Small right vertebral artery without  stenosis. Skeleton: No acute skeletal abnormality. Other neck: Negative for mass or adenopathy in the neck. Upper chest: Lung apices clear bilaterally. Superior mediastinum negative. Review of the MIP images confirms the above findings CTA HEAD FINDINGS Anterior circulation: Internal carotid artery widely patent bilaterally. Left M1 segment widely patent. There is occlusion of the proximal left M2 inferior division which appears acute. This corresponds to low-density on CT compatible with acute infarct. Superior division left MCA patent Both anterior cerebral arteries widely patent. Right middle cerebral artery and branches widely patent. Posterior circulation: Both vertebral arteries patent to the basilar. PICA patent bilaterally. Basilar widely patent. Superior cerebellar and posterior cerebral arteries patent bilaterally without stenosis or occlusion. Venous sinuses: Early contrast enhancement of the dural sinuses not well evaluated. Anatomic variants: None Review of the MIP images confirms the above findings IMPRESSION: 1. 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 Acute occlusion inferior division left M2  . 3. No other intracranial stenosis 4. No extracranial stenosis in the carotid or vertebral arteries. 5. These results were called by telephone at the time of interpretation on 04/21/2020 at 6:59 pm to provider JOSHUA LONG , who verbally acknowledged these results. Electronically Signed   By: Marlan Palau M.D.   On: 04/21/2020 19:00   CT HEAD WO CONTRAST  Result Date: 04/22/2020 CLINICAL DATA:  Stroke follow-up. EXAM: CT HEAD WITHOUT CONTRAST TECHNIQUE: Contiguous axial images were obtained from the base of the skull through the vertex without intravenous contrast. COMPARISON:  Head CT, CTA, and MRI 04/21/2020 FINDINGS: Brain: An evolving moderately large acute left MCA infarct is similar in extent to the prior MRI with increasing cytotoxic edema. There is partial effacement of the left lateral ventricle  with at most trace midline shift. Multiple small areas of hemorrhage within the infarct are similar to the MRI. There is no extra-axial fluid collection. There is a background of mild chronic small vessel ischemia in the cerebral white matter bilaterally. Vascular: Calcified atherosclerosis at the skull base. Hyperdense left MCA branch vessel corresponding to the M2 occlusion on CTA. Skull: No fracture or suspicious osseous lesion. Sinuses/Orbits: Opacification of a single right ethmoid air cell. Clear mastoid air cells. Unremarkable orbits. Other: None. IMPRESSION: Evolving left MCA infarct with unchanged small areas of hemorrhage. Electronically Signed   By: Sebastian Ache M.D.   On: 04/22/2020 07:47   CT Head Wo Contrast  Result Date: 04/21/2020 CLINICAL DATA:  Stroke suspected, slurred speech, right-sided weakness and facial droop EXAM: CT HEAD WITHOUT CONTRAST TECHNIQUE: Contiguous axial images were obtained from the base of the skull through the vertex without intravenous contrast. COMPARISON:  None. FINDINGS: Brain: There is a hypodensity of the anterior left MCA territory centered about the insula and frontal operculum, involving the left basal ganglia, particularly the  putamen and internal capsule, as well as the high left MCA territory cortex. There is no involvement of the temporal lobe. No evidence of hemorrhage. There is mild mass effect on the left lateral ventricle with approximately 3 mm left right midline shift. Vascular: Subtly hyperdense M2 segmental vessel in the left insula (series 2, image 12). Skull: Normal. Negative for fracture or focal lesion. Sinuses/Orbits: No acute finding. Other: None. IMPRESSION: 1. There is a large hypodensity of the anterior left MCA territory, findings consistent with acute to subacute left MCA territory infarction. ASPECTS 4. 2.  No evidence of hemorrhage. 3. There is mild mass effect on the left lateral ventricle with approximately 3 mm left right midline shift.  Findings reported by telephone to Dr. Jacqulyn BathLong at the time of interpretation, 6:48 p.m., 04/21/2020 Electronically Signed   By: Lauralyn PrimesAlex  Bibbey M.D.   On: 04/21/2020 18:53   CT Angio Neck W and/or Wo Contrast  Result Date: 04/21/2020 CLINICAL DATA:  Stroke.  Slurred speech EXAM: CT ANGIOGRAPHY HEAD AND NECK TECHNIQUE: Multidetector CT imaging of the head and neck was performed using the standard protocol during bolus administration of intravenous contrast. Multiplanar CT image reconstructions and MIPs were obtained to evaluate the vascular anatomy. Carotid stenosis measurements (when applicable) are obtained utilizing NASCET criteria, using the distal internal carotid diameter as the denominator. CONTRAST:  75mL OMNIPAQUE IOHEXOL 350 MG/ML SOLN COMPARISON:  CT head 04/21/2020 FINDINGS: CTA NECK FINDINGS Aortic arch: Mild atherosclerotic calcification aortic arch. Bovine branching pattern. Proximal great vessels patent. Right carotid system: Right carotid widely patent without stenosis. Minimal atherosclerotic calcification right carotid bulb. Medial deviation of the carotid bifurcation and internal carotid artery posterior to the pharynx. Left carotid system: Left carotid widely patent with mild atherosclerotic calcification at the bifurcation. Medial deviation of the carotid bifurcation internal carotid artery in the retropharyngeal space. Vertebral arteries: Left vertebral artery dominant. Left vertebral artery patent to the basilar without stenosis. Small right vertebral artery without stenosis. Skeleton: No acute skeletal abnormality. Other neck: Negative for mass or adenopathy in the neck. Upper chest: Lung apices clear bilaterally. Superior mediastinum negative. Review of the MIP images confirms the above findings CTA HEAD FINDINGS Anterior circulation: Internal carotid artery widely patent bilaterally. Left M1 segment widely patent. There is occlusion of the proximal left M2 inferior division which appears  acute. This corresponds to low-density on CT compatible with acute infarct. Superior division left MCA patent Both anterior cerebral arteries widely patent. Right middle cerebral artery and branches widely patent. Posterior circulation: Both vertebral arteries patent to the basilar. PICA patent bilaterally. Basilar widely patent. Superior cerebellar and posterior cerebral arteries patent bilaterally without stenosis or occlusion. Venous sinuses: Early contrast enhancement of the dural sinuses not well evaluated. Anatomic variants: None Review of the MIP images confirms the above findings IMPRESSION: 1. 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 Acute occlusion inferior division left M2  . 3. No other intracranial stenosis 4. No extracranial stenosis in the carotid or vertebral arteries. 5. These results were called by telephone at the time of interpretation on 04/21/2020 at 6:59 pm to provider JOSHUA LONG , who verbally acknowledged these results. Electronically Signed   By: Marlan Palauharles  Clark M.D.   On: 04/21/2020 19:00   MR BRAIN WO CONTRAST  Result Date: 04/21/2020 CLINICAL DATA:  Stroke slurred speech EXAM: MRI HEAD WITHOUT CONTRAST TECHNIQUE: Multiplanar, multiecho pulse sequences of the brain and surrounding structures were obtained without intravenous contrast. COMPARISON:  CT angio head neck 05/01/2020 FINDINGS:  Brain: Acute infarct left MCA territory. Infarct involves the left frontal lobe, insula as well as the caudate and putamen. There are areas of hemorrhage within the caudate and putamen as well as in the left frontal lobe. These are not identified on CT. Scattered small white matter hyperintensities elsewhere compatible with chronic microvascular ischemia. Ventricle size normal. Negative for mass lesion. Vascular: Normal arterial flow voids Skull and upper cervical spine: No focal skeletal lesion Sinuses/Orbits: Negative Other: None IMPRESSION: Acute infarct left MCA territory there is mild hemorrhage  in the infarct which is not seen on recent CT earlier today. These results were called by telephone at the time of interpretation on 04/21/2020 at 9:52 pm to provider CHING TU , who verbally acknowledged these results. Electronically Signed   By: Marlan Palau M.D.   On: 04/21/2020 21:53   ECHOCARDIOGRAM COMPLETE  Result Date: 04/22/2020    ECHOCARDIOGRAM REPORT   Patient Name:   Guerry Bruin Date of Exam: 04/22/2020 Medical Rec #:  [id number]     Height:       65.0 in Accession #:    [id number]    Weight:       167.1 lb Date of Birth:  Mar 30, 1956     BSA:          1.833 m Patient Age:    64 years      BP:           115/88 mmHg Patient Gender: F             HR:           114 bpm. Exam Location:  Inpatient Procedure: 2D Echo, Cardiac Doppler and Color Doppler Indications:    Stroke  History:        Patient has no prior history of Echocardiogram examinations.                 Stroke, Arrythmias:Atrial Fibrillation; Risk                 Factors:Hypertension. ETOH.  Sonographer:    Sheralyn Boatman RDCS Referring Phys: [id number] CHING T TU  Sonographer Comments: Technically difficult study due to poor echo windows. Poor sound wave transmission. IMPRESSIONS  1. Left ventricular ejection fraction, by estimation, is 60 to 65%. The left ventricle has normal function. The left ventricle has no regional wall motion abnormalities. There is mild left ventricular hypertrophy. Left ventricular diastolic parameters are indeterminate.  2. Right ventricular systolic function is normal. The right ventricular size is normal. There is normal pulmonary artery systolic pressure. The estimated right ventricular systolic pressure is 27.6 mmHg.  3. Left atrial size was mild to moderately dilated.  4. Right atrial size was mildly dilated.  5. The mitral valve is normal in structure. No evidence of mitral valve regurgitation. No evidence of mitral stenosis.  6. The aortic valve is tricuspid. Aortic valve regurgitation is not visualized. No aortic  stenosis is present.  7. The inferior vena cava is normal in size with greater than 50% respiratory variability, suggesting right atrial pressure of 3 mmHg. FINDINGS  Left Ventricle: Left ventricular ejection fraction, by estimation, is 60 to 65%. The left ventricle has normal function. The left ventricle has no regional wall motion abnormalities. The left ventricular internal cavity size was normal in size. There is  mild left ventricular hypertrophy. Left ventricular diastolic parameters are indeterminate. Right Ventricle: The right ventricular size is normal. No increase in right ventricular wall thickness. Right ventricular systolic function  is normal. There is normal pulmonary artery systolic pressure. The tricuspid regurgitant velocity is 2.48 m/s, and  with an assumed right atrial pressure of 3 mmHg, the estimated right ventricular systolic pressure is 27.6 mmHg. Left Atrium: Left atrial size was mild to moderately dilated. Right Atrium: Right atrial size was mildly dilated. Pericardium: Trivial pericardial effusion is present. Mitral Valve: The mitral valve is normal in structure. There is mild calcification of the mitral valve leaflet(s). Mild mitral annular calcification. No evidence of mitral valve regurgitation. No evidence of mitral valve stenosis. Tricuspid Valve: The tricuspid valve is normal in structure. Tricuspid valve regurgitation is trivial. Aortic Valve: The aortic valve is tricuspid. Aortic valve regurgitation is not visualized. No aortic stenosis is present. Pulmonic Valve: The pulmonic valve was normal in structure. Pulmonic valve regurgitation is not visualized. Aorta: The aortic root is normal in size and structure. Venous: The inferior vena cava is normal in size with greater than 50% respiratory variability, suggesting right atrial pressure of 3 mmHg. IAS/Shunts: No atrial level shunt detected by color flow Doppler.  LEFT VENTRICLE PLAX 2D LVIDd:         3.85 cm LVIDs:         2.73 cm LV  PW:         0.89 cm LV IVS:        1.40 cm LVOT diam:     1.70 cm LV SV:         38 LV SV Index:   21 LVOT Area:     2.27 cm  LV Volumes (MOD) LV vol d, MOD A2C: 52.1 ml LV vol d, MOD A4C: 48.9 ml LV vol s, MOD A2C: 21.7 ml LV vol s, MOD A4C: 17.7 ml LV SV MOD A2C:     30.4 ml LV SV MOD A4C:     48.9 ml LV SV MOD BP:      30.4 ml RIGHT VENTRICLE         IVC TAPSE (M-mode): 1.4 cm  IVC diam: 1.73 cm LEFT ATRIUM             Index       RIGHT ATRIUM           Index LA diam:        3.60 cm 1.96 cm/m  RA Area:     17.90 cm LA Vol (A2C):   75.4 ml 41.14 ml/m RA Volume:   53.70 ml  29.30 ml/m LA Vol (A4C):   53.8 ml 29.36 ml/m LA Biplane Vol: 64.1 ml 34.98 ml/m  AORTIC VALVE LVOT Vmax:   102.00 cm/s LVOT Vmean:  62.200 cm/s LVOT VTI:    0.168 m  AORTA Ao Root diam: 3.10 cm Ao Asc diam:  3.20 cm MITRAL VALVE                TRICUSPID VALVE MV Area (PHT): 4.28 cm     TR Peak grad:   24.6 mmHg MV Decel Time: 177 msec     TR Vmax:        248.00 cm/s MV E velocity: 108.67 cm/s                             SHUNTS                             Systemic VTI:  0.17 m  Systemic Diam: 1.70 cm Marca Ancona MD Electronically signed by Marca Ancona MD Signature Date/Time: 04/22/2020/4:34:41 PM    Final    VAS [place] LOWER EXTREMITY VENOUS (DVT)  Result Date: 04/22/2020  Lower Venous DVTStudy Indications: Stroke.  Comparison Study: no prior Performing Technologist: [name] RVS  Examination Guidelines: A complete evaluation includes B-mode imaging, spectral Doppler, color Doppler, and power Doppler as needed of all accessible portions of each vessel. Bilateral testing is considered an integral part of a complete examination. Limited examinations for reoccurring indications may be performed as noted. The reflux portion of the exam is performed with the patient in reverse Trendelenburg.  +---------+---------------+---------+-----------+----------+--------------+ RIGHT     CompressibilityPhasicitySpontaneityPropertiesThrombus Aging +---------+---------------+---------+-----------+----------+--------------+ CFV      Full           Yes      Yes                                 +---------+---------------+---------+-----------+----------+--------------+ SFJ      Full                                                        +---------+---------------+---------+-----------+----------+--------------+ FV Prox  Full                                                        +---------+---------------+---------+-----------+----------+--------------+ FV Mid   Full                                                        +---------+---------------+---------+-----------+----------+--------------+ FV DistalFull                                                        +---------+---------------+---------+-----------+----------+--------------+ PFV      Full                                                        +---------+---------------+---------+-----------+----------+--------------+ POP      Full           Yes      Yes                                 +---------+---------------+---------+-----------+----------+--------------+ PTV      Full                                                        +---------+---------------+---------+-----------+----------+--------------+  PERO                                                  Not visualized +---------+---------------+---------+-----------+----------+--------------+   +---------+---------------+---------+-----------+----------+--------------+ LEFT     CompressibilityPhasicitySpontaneityPropertiesThrombus Aging +---------+---------------+---------+-----------+----------+--------------+ CFV      Full           Yes      Yes                                 +---------+---------------+---------+-----------+----------+--------------+ SFJ      Full                                                         +---------+---------------+---------+-----------+----------+--------------+ FV Prox  Full                                                        +---------+---------------+---------+-----------+----------+--------------+ FV Mid   Full                                                        +---------+---------------+---------+-----------+----------+--------------+ FV DistalFull                                                        +---------+---------------+---------+-----------+----------+--------------+ PFV      Full                                                        +---------+---------------+---------+-----------+----------+--------------+ POP      Full           Yes      Yes                                 +---------+---------------+---------+-----------+----------+--------------+ PTV      Full                                                        +---------+---------------+---------+-----------+----------+--------------+ PERO                                                  Not visualized +---------+---------------+---------+-----------+----------+--------------+  Summary: BILATERAL: - No evidence of deep vein thrombosis seen in the lower extremities, bilaterally. - No evidence of superficial venous thrombosis in the lower extremities, bilaterally. -No evidence of popliteal cyst, bilaterally.   *See table(s) above for measurements and observations. Electronically signed by Fabienne Bruns MD on 04/22/2020 at 6:11:35 PM.    Final     Cardiac Studies    Patient Profile       Assessment & Plan   Principal Problem:   CVA (cerebral vascular accident) [facility]) Active Problems:   Unspecified atrial fibrillation (HCC)   Essential hypertension   Alcohol abuse   1. AFIB RVR - continue metoprolol per tube, uptitrate as able with permissive HTN. - defer decision about anticoagulation to neurology, not appropriate for [facility] currently,  CHADSVASC 4 - avoiding rhythm control strategy currently due to inability to use AC.  2. ACUTE CVA - L MCA with hemorrhagic conversion and midline shift -atorvastatin - per neurology  3. HTN - permissive HTN per neurology  4. HLD - statin  5. Prediabetes      For questions or updates, please contact [facility] Please consult [email or web address] for contact info under        Signed, Parke Poisson, MD  04/23/2020, 8:56 AM

## 2020-04-23 NOTE — Progress Notes (Signed)
   04/23/20 0800  Assess: MEWS Score  BP (!) 132/96  Pulse Rate (!) 111  ECG Heart Rate (!) 113  Resp 19  Level of Consciousness Alert (drowsy)  SpO2 98 %  O2 Device Room Air  Assess: MEWS Score  MEWS Temp 0  MEWS Systolic 0  MEWS Pulse 2  MEWS RR 0  MEWS LOC 0  MEWS Score 2  MEWS Score Color Yellow  Assess: if the MEWS score is Yellow or Red  Were vital signs taken at a resting state? Yes  Focused Assessment No change from prior assessment  Early Detection of Sepsis Score *See Row Information* Low  MEWS guidelines implemented *See Row Information* Yes  Treat  MEWS Interventions Other (Comment) (assessed pt)  Pain Scale Faces  Pain Score 0  Faces Pain Scale 0  Take Vital Signs  Increase Vital Sign Frequency  Yellow: Q 2hr X 2 then Q 4hr X 2, if remains yellow, continue Q 4hrs  Escalate  MEWS: Escalate Yellow: discuss with charge nurse/RN and consider discussing with provider and RRT  Notify: Charge Nurse/RN  Name of Charge Nurse/RN Notified sarah, rn  Date Charge Nurse/RN Notified 04/23/20  Time Charge Nurse/RN Notified 0800  Notify: Provider  Provider Name/Title ghimire  Date Provider Notified 04/23/20  Time Provider Notified 0830  Notification Type Face-to-face  Notification Reason Change in status (mews yellow)  Response No new orders  Date of Provider Response 04/23/20  Time of Provider Response 0830

## 2020-04-23 NOTE — Progress Notes (Signed)
Physical Therapy Treatment Patient Details Name: Lindsey [name] MRN: [id number] DOB: 23-Mar-1956 Today's Date: 04/23/2020    History of Present Illness 64 yo female admitted to ED on 7/20 with R weakness, facial droop, and severe dysarthria. CT and MRI brain reveal large L MCA, initially ischemic but with some hemorrhagic conversion. PMH includes HTN, afib with RVR.    PT Comments    Patient following verbal commands ~75% of the time in a moderately distractive environment (MD in during session and discussing with family patient's status). Patient able to turn head fully to the right when asked, however still not crossing eyes past midline to the right. Seated EOB she was able to follow commands for upright sitting, head turns, raising LUE with up to mod assist for balance. When not engaged in activity, pt begins to push herself beyond midline to her rt using her LUE.    Follow Up Recommendations  CIR;Supervision/Assistance - 24 hour     Equipment Recommendations  Other (comment) (TBD)    Recommendations for Other Services       Precautions / Restrictions Precautions Precautions: Fall Precaution Comments: R neglect, R hemi    Mobility  Bed Mobility Overal bed mobility: Needs Assistance Bed Mobility: Rolling;Sidelying to Sit;Sit to Supine Rolling: Mod assist;Min assist Sidelying to sit: +2 for safety/equipment;HOB elevated;Mod assist   Sit to supine: +2 for safety/equipment;Min assist   General bed mobility comments: Pt noted to lie with RLE in flexor synergy pattern with +tone; RUE noted associated reaction with yawn  Transfers                 General transfer comment: not assessed this day  Ambulation/Gait                 Stairs             Wheelchair Mobility    Modified Rankin (Stroke Patients Only)       Balance Overall balance assessment: Needs assistance Sitting-balance support: Feet supported;Single extremity supported Sitting  balance-Leahy Scale: Poor Sitting balance - Comments: assist varying from min-max to stay upright at EOB; pt beginning to push with LUE to the point of falling to her rt Postural control: Right lateral lean     Standing balance comment: unable to assess this day                            Cognition Arousal/Alertness: Awake/alert;Lethargic (periods of lethargy, improved with sitting EOB) Behavior During Therapy: Flat affect Overall Cognitive Status: Difficult to assess                         Following Commands: Follows one step commands inconsistently;Follows one step commands with increased time       General Comments: Patient following simple commands more briskly today and ~75% of verbal commands.      Exercises Other Exercises Other Exercises: PROM RUE and RLE during MD's interruption    General Comments General comments (skin integrity, edema, etc.): Daughter and grandson present; Rehab MD in during session      Pertinent Vitals/Pain Pain Assessment: Faces Faces Pain Scale: No hurt    Home Living                      Prior Function            PT Goals (current goals can now be found in the  care plan section) Acute Rehab PT Goals Patient Stated Goal: states "yes indeed" to PT stating "we will keep working on your mobility and strength" Time For Goal Achievement: 05/06/20 Potential to Achieve Goals: Good Progress towards PT goals: Progressing toward goals    Frequency    Min 4X/week      PT Plan Current plan remains appropriate    Co-evaluation              AM-PAC PT "6 Clicks" Mobility   Outcome Measure  Help needed turning from your back to your side while in a flat bed without using bedrails?: A Lot Help needed moving from lying on your back to sitting on the side of a flat bed without using bedrails?: A Lot Help needed moving to and from a bed to a chair (including a wheelchair)?: Total Help needed standing up  from a chair using your arms (e.g., wheelchair or bedside chair)?: Total Help needed to walk in hospital room?: Total Help needed climbing 3-5 steps with a railing? : Total 6 Click Score: 8    End of Session   Activity Tolerance: Patient limited by fatigue Patient left: in bed;with call bell/phone within reach;with bed alarm set;with family/visitor present Nurse Communication: Other (comment) (needs to be cleaned up (had BM in sitting)) PT Visit Diagnosis: Other abnormalities of gait and mobility (R26.89);Hemiplegia and hemiparesis Hemiplegia - Right/Left: Right Hemiplegia - dominant/non-dominant: Dominant Hemiplegia - caused by: Cerebral infarction     Time: 4098-1191 PT Time Calculation (min) (ACUTE ONLY): 26 min  Charges:  $Neuromuscular Re-education: 23-37 mins                      Jerolyn Center, PT Pager [phone]    [name] 04/23/2020, 5:46 PM

## 2020-04-23 NOTE — TOC Initial Note (Signed)
Transition of Care [facility]) - Initial/Assessment Note    Patient Details  Name: Lindsey [name] MRN: [id number] Date of Birth: 1956-09-04  Transition of Care [facility]) CM/SW Contact:    Lockie Pares, RN Phone Number: 04/23/2020, 3:36 PM  Clinical Narrative:                 Spoke to daughter, she is at bedside. Patient is admitted with stroke. She is aphasic. Did pass her swallow study. She was positive for THC on arrival. Daughter is aware. She is living with daughter at present.  Daughter had questions regarding medicaid. Told her of calling the toll free number to answer questions or going on line and filling out the application.  She will be doing this today.  She is planning on taking patient home with home health,but trying to make sure there is 24./7 coverage at the house as she does work.  Spoke about [employer] and [employer] program.  Will follow up with patient and daughter. Patient has no husband, no other children per daughter.   Expected Discharge Plan: IP Rehab Facility Barriers to Discharge: Continued Medical Work up   Patient Goals and CMS Choice Patient states their goals for this hospitalization and ongoing recovery are:: Patinet canot speak- daughters goals are to get the patient help and 24 hour care.      Expected Discharge Plan and Services Expected Discharge Plan: IP Rehab Facility                                              Prior Living Arrangements/Services   Lives with:: Adult Children Patient language and need for interpreter reviewed:: Yes        Need for Family Participation in Patient Care: Yes (Comment) Care giver support system in place?: Yes (comment)   Criminal Activity/Legal Involvement Pertinent to Current Situation/Hospitalization: No - Comment as needed  Activities of Daily Living      Permission Sought/Granted      Share Information with NAME: Daughter           Emotional Assessment Appearance:: Appears older  than stated age   Affect (typically observed): Unable to Assess Orientation: : Fluctuating Orientation (Suspected and/or reported Sundowners) Alcohol / Substance Use: Illicit Drugs Psych Involvement: No (comment)  Admission diagnosis:  CVA (cerebral vascular accident) (HCC) [I63.9] Cerebrovascular accident (CVA) due to embolism of cerebral artery (HCC) [I63.40] Patient Active Problem List   Diagnosis Date Noted  . CVA (cerebral vascular accident) (HCC) 04/21/2020  . Unspecified atrial fibrillation (HCC) 04/21/2020  . Essential hypertension 04/21/2020  . Alcohol abuse 04/21/2020   PCP:  Emogene Morgan, MD Pharmacy:   [facility] [address], [place] - [address] [place] [address] Phone: [phone] Fax: [phone]     Social Determinants of Health (SDOH) Interventions    Readmission Risk Interventions No flowsheet data found.

## 2020-04-23 NOTE — TOC CAGE-AID Note (Signed)
Transition of Care (TOC) - CAGE-AID Screening   Patient Details  Name: Davion Truszkowski MRN: [id number] Date of Birth: 01/13/1956  Transition of Care (TOC) CM/SW Contact:    Miranda  Gainey, LCSWA Phone Number: 04/23/2020, 12:33 PM   Clinical Narrative:  CSW met with pt at bedside. CSW introduced self and explained her role at the hospital. CSW attempted to do the assessment but pt was unable speak and expressed pain when trying to shake or nod for yes/no questions. CSW will reattempt at more appropriate time.   CAGE-AID Screening: Substance Abuse Screening unable to be completed due to: : Patient unable to participate               Miranda Gainey, LCSWA, LCASA Clinical Social Worker [phone]           

## 2020-04-23 NOTE — Progress Notes (Signed)
   04/23/20 0317  Assess: MEWS Score  Temp 99.2 F (37.3 C)  BP (!) 137/95  Pulse Rate (!) 134  ECG Heart Rate (!) 138  Resp 17  Level of Consciousness Alert  SpO2 99 %  O2 Device Room Air  Patient Activity (if Appropriate) In bed  Assess: MEWS Score  MEWS Temp 0  MEWS Systolic 0  MEWS Pulse 3  MEWS RR 0  MEWS LOC 0  MEWS Score 3  MEWS Score Color Yellow  Assess: if the MEWS score is Yellow or Red  Were vital signs taken at a resting state? Yes  Focused Assessment No change from prior assessment  Early Detection of Sepsis Score *See Row Information* Low  MEWS guidelines implemented *See Row Information* Yes  Treat  MEWS Interventions Escalated (See documentation below)  Take Vital Signs  Increase Vital Sign Frequency  Yellow: Q 2hr X 2 then Q 4hr X 2, if remains yellow, continue Q 4hrs  Escalate  MEWS: Escalate Yellow: discuss with charge nurse/RN and consider discussing with provider and RRT  Notify: Charge Nurse/RN  Name of Charge Nurse/RN Notified Claire Shown, RN  Date Charge Nurse/RN Notified 04/23/20  Time Charge Nurse/RN Notified 0321  Notify: Provider  Provider Name/Title B. Chotiner, MD  Date Provider Notified 04/23/20  Time Provider Notified [phone]  Notification Type Page  Notification Reason Change in status  Response No new orders  Date of Provider Response 04/23/20  Time of Provider Response 0327  Notify: Rapid Response  Name of Rapid Response RN Notified n/a  Document  Progress note created (see row info) Yes   Notified by central tele of patient's elevated HR.  Upon checking vital signs patient flagged yellow MEWS.  Paged [facility] on-call provider Dr. Rachael Darby, and discussed case.  Pt is not sustaining at 120's-130's but gets into this rate when awake.  When pt is at rest/sleeping, HR is in the low 100's.  Per Dr. Rachael Darby, monitor HR for now and if pt begins to sustain 120's-130's at rest, consider IV cardizem.

## 2020-04-24 ENCOUNTER — Inpatient Hospital Stay (HOSPITAL_COMMUNITY): Payer: Medicaid Other

## 2020-04-24 LAB — GLUCOSE, CAPILLARY
Glucose-Capillary: 114 mg/dL — ABNORMAL HIGH (ref 70–99)
Glucose-Capillary: 131 mg/dL — ABNORMAL HIGH (ref 70–99)
Glucose-Capillary: 138 mg/dL — ABNORMAL HIGH (ref 70–99)
Glucose-Capillary: 119 mg/dL — ABNORMAL HIGH (ref 70–99)
Glucose-Capillary: 137 mg/dL — ABNORMAL HIGH (ref 70–99)
Glucose-Capillary: 121 mg/dL — ABNORMAL HIGH (ref 70–99)

## 2020-04-24 LAB — BASIC METABOLIC PANEL
Anion gap: 12 (ref 5–15)
BUN: 20 mg/dL (ref 8–23)
CO2: 25 mmol/L (ref 22–32)
Calcium: 9.3 mg/dL (ref 8.9–10.3)
Chloride: 102 mmol/L (ref 98–111)
Creatinine, Ser: 0.64 mg/dL (ref 0.44–1.00)
GFR calc Af Amer: >60 mL/min (ref >60)
GFR calc non Af Amer: >60 mL/min (ref >60)
Glucose, Bld: 145 mg/dL — ABNORMAL HIGH (ref 70–99)
Potassium: 3.8 mmol/L (ref 3.5–5.1)
Sodium: 139 mmol/L (ref 135–145)

## 2020-04-24 LAB — MAGNESIUM: Magnesium: 1.7 mg/dL (ref 1.7–2.4)

## 2020-04-24 IMAGING — CT CT HEAD W/O CM
4 series · 16 of 47 positions shown, 18 images · non-contrast
Comparison: [DATE] head CT and [DATE] MRI

CLINICAL DATA: Stroke follow-up.

EXAM:
CT HEAD WITHOUT CONTRAST
TECHNIQUE: Contiguous axial images were obtained from the base of the skull
through the vertex without intravenous contrast.

[Series 3: head wo · axial · 0.47mm/px · z∈[-86,+39]mm · 7 of 35 slices shown, 9 images]
[im 5/35  brain]
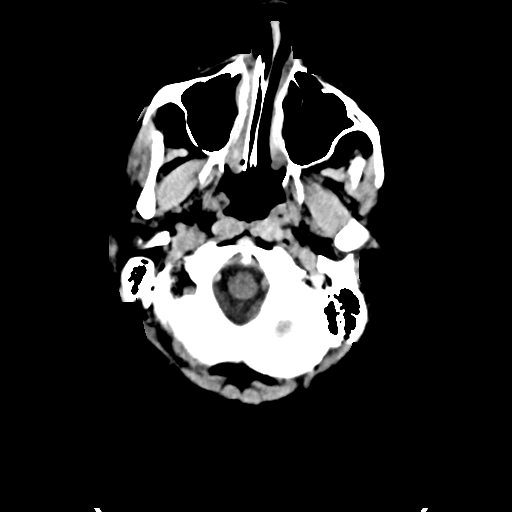
[im 5/35  bone]
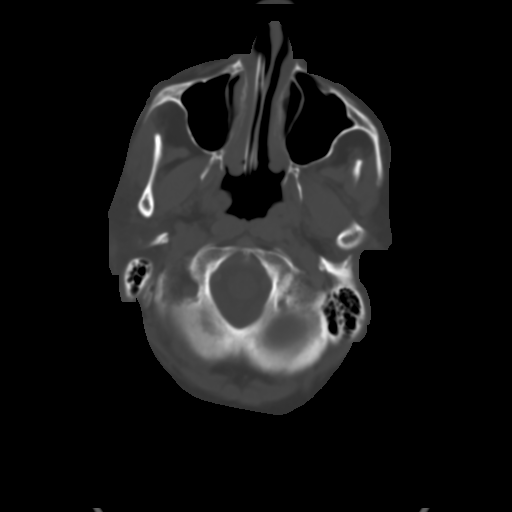
[im 9/35  brain]
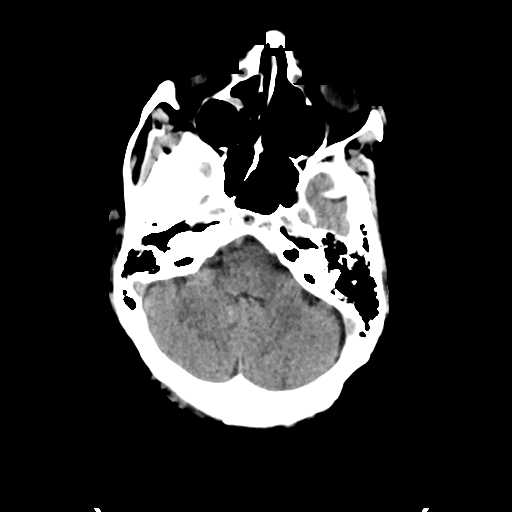
[im 13/35  brain]
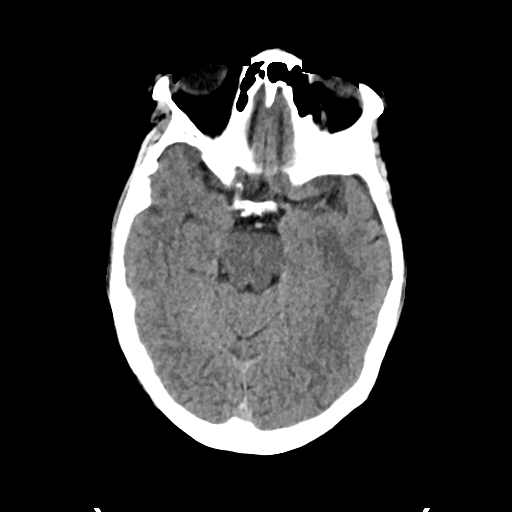
[im 18/35  brain]
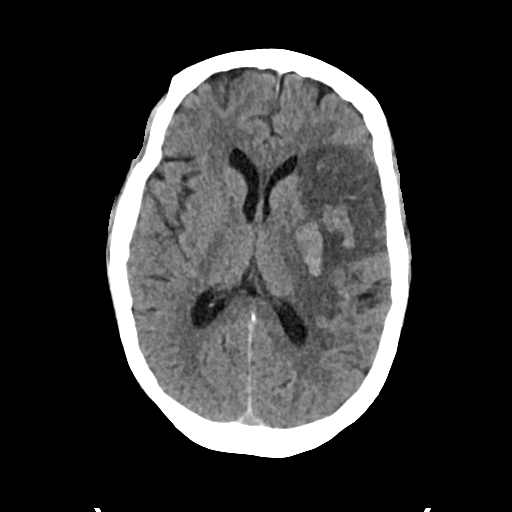
[im 22/35  brain]
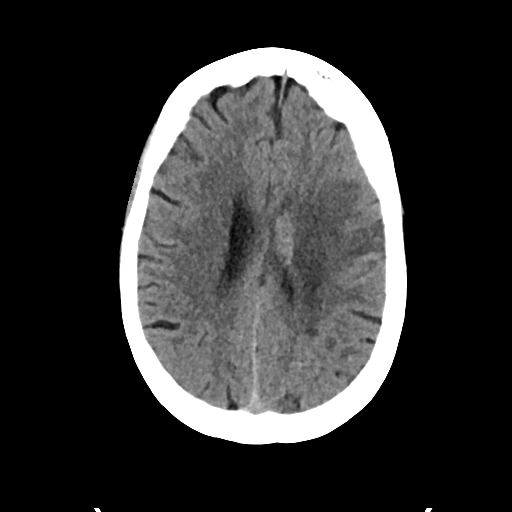
[im 22/35  bone]
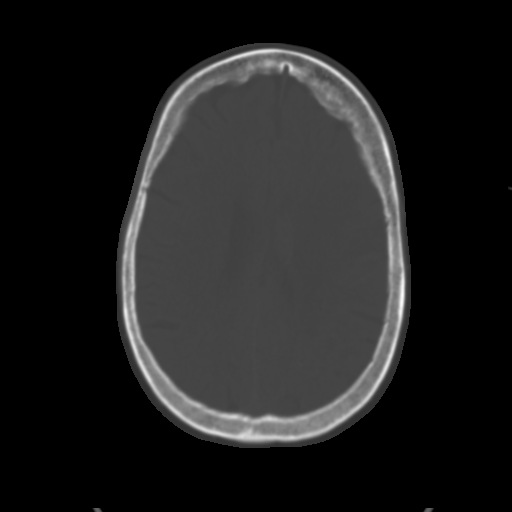
[im 26/35  brain]
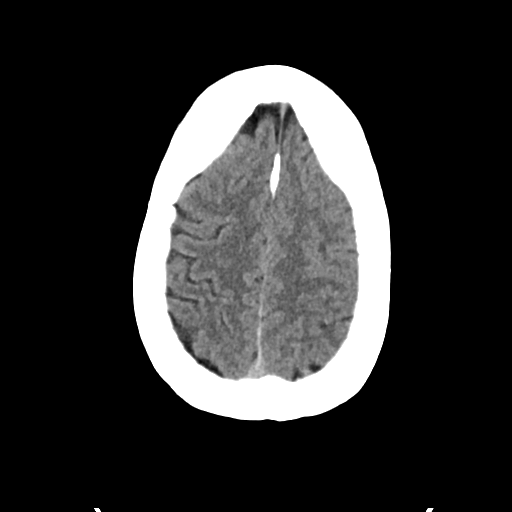
[im 30/35  brain]
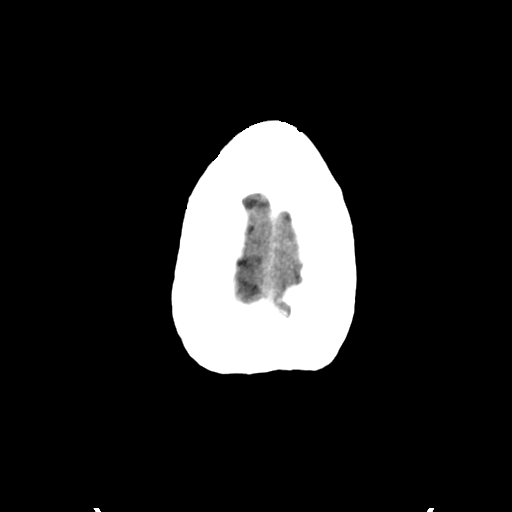

[Series 4: head bone · axial · 0.47mm/px · z∈[-90,-54]mm · 3 of 88 slices shown]
[im 9/88  bone]
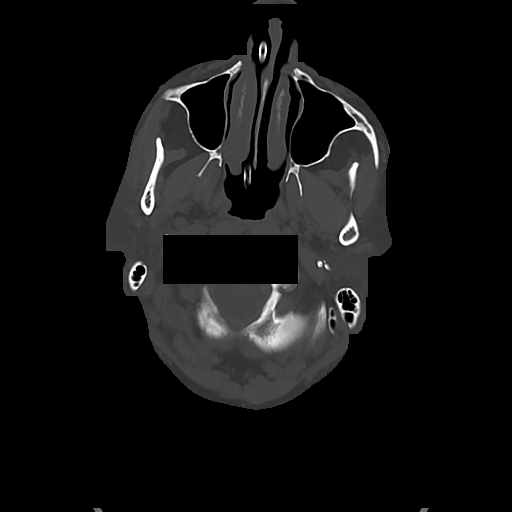
[im 18/88  bone]
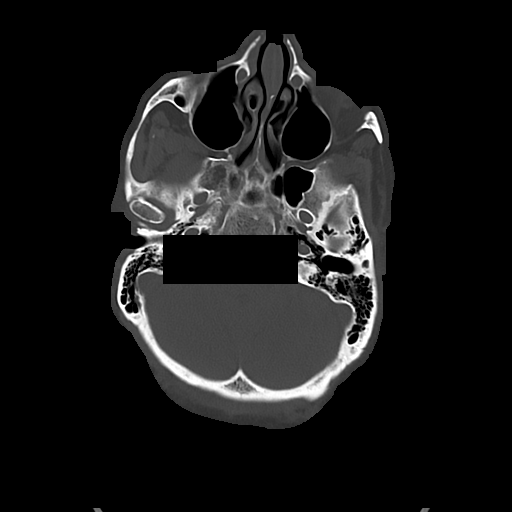
[im 27/88  bone]
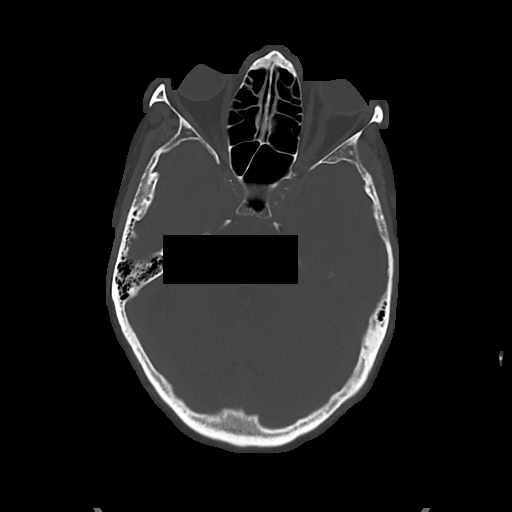

[Series 5: cor soft · coronal · 0.39mm/px · 3 of 79 slices shown]
[im 27/79  brain]
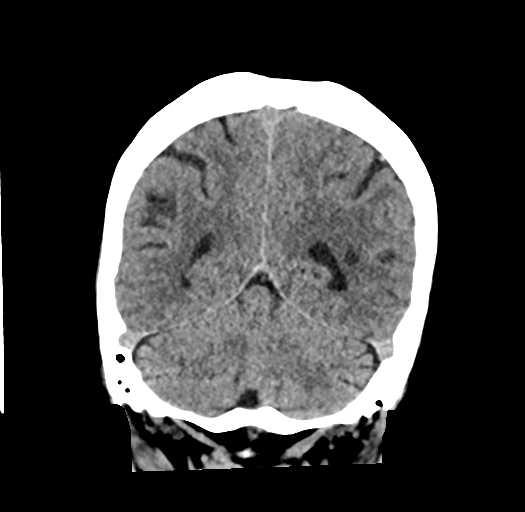
[im 35/79  brain]
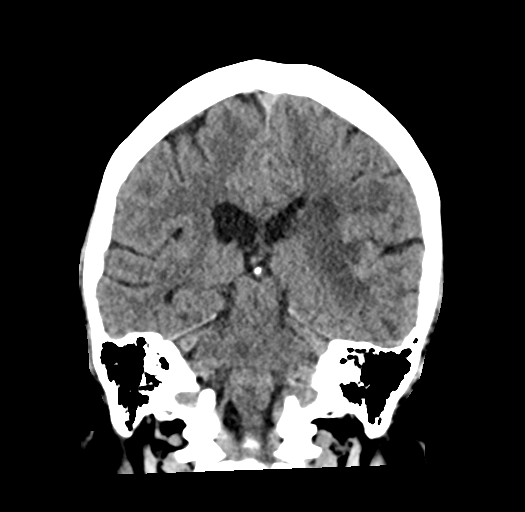
[im 44/79  brain]
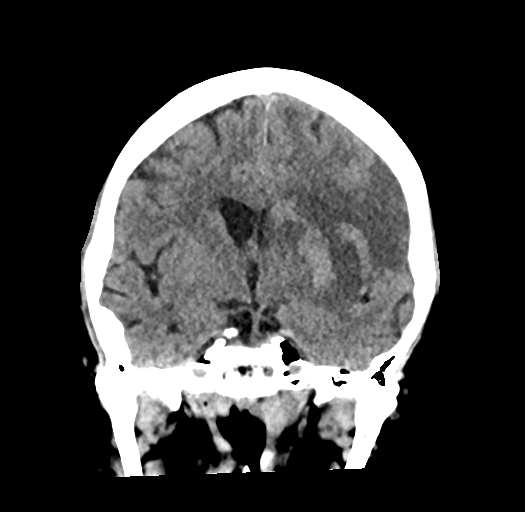

[Series 6: sag soft · sagittal · 0.38mm/px · 3 of 54 slices shown]
[im 18/54  brain]
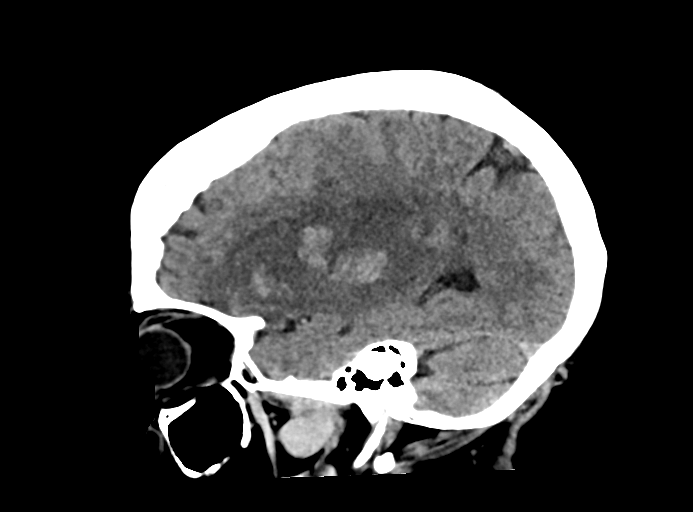
[im 27/54  brain]
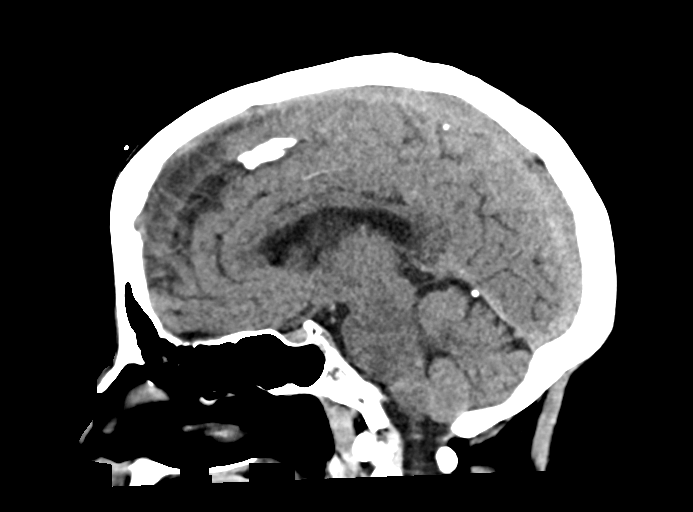
[im 36/54  brain]
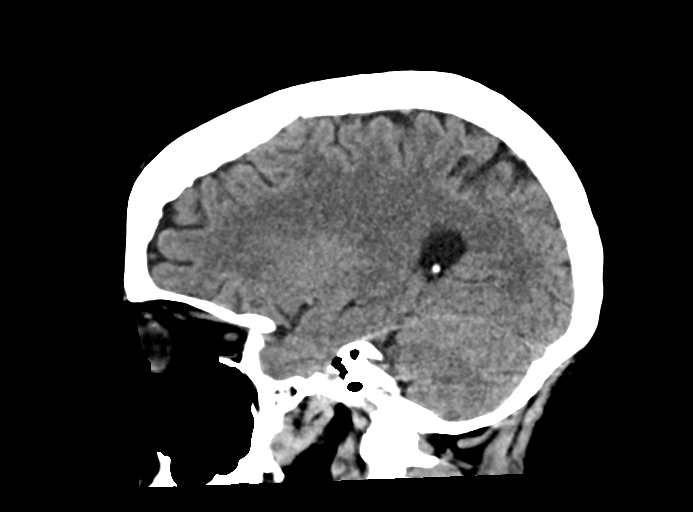

[16 of 47 positions shown; findings below may reference images not displayed]

FINDINGS: Brain: A moderately large left MCA infarct demonstrates further
evolution with increasing cytotoxic edema and increased rightward
midline shift which now measures 3 mm. There is partial effacement
of the left lateral ventricle. Multiple foci of hemorrhage are again
seen within the infarct with some appearing more well-defined and
perhaps slightly larger compared to the prior CT, however no
substantial enlargement is evident compared to the prior MRI. No new
infarct or extra-axial fluid collection is identified. There is no
evidence of hydrocephalus. A background of mild chronic small vessel
ischemia is again noted in the cerebral white matter.

Vascular: Calcified atherosclerosis at the skull base.

Skull: No fracture or suspicious osseous lesion.

Sinuses/Orbits: Opacification of a single right ethmoid air cell.
Clear mastoid air cells. Unremarkable orbits.

Other: None.
IMPRESSION: 1. Evolving left MCA infarct with increasing edema and increased
rightward midline shift, now 3 mm.
2. Small hemorrhages within the infarct without significant
progression.

## 2020-04-24 MED ORDER — MAGNESIUM SULFATE 2 GM/50ML IV SOLN
2.0000 g | Freq: Once | INTRAVENOUS | Status: AC
  Administered 2020-04-24: 2 g via INTRAVENOUS
  Filled 2020-04-24: qty 50

## 2020-04-24 NOTE — Progress Notes (Addendum)
Inpatient Rehabilitation Admissions Coordinator  I spoke with patient's 's daughter by phone. We discussed goals and expectations of a potential Cir admit if she could arrange 24/7 physical care at home after Cir for which we project she will need. She states she is working out if that can be arranged. I will follow up next week to clarify if caregiver support will be available.  Ottie Glazier, RN, MSN Rehab Admissions Coordinator [phone] 04/24/2020 12:59 PM

## 2020-04-24 NOTE — Progress Notes (Signed)
STROKE TEAM PROGRESS NOTE   INTERVAL HISTORY Patient is sitting up in bed. Her daughter is at bedside. She r is doing a little better today.  She is able to answer her name and speak occasional words but not complete sentences.  She follows simple midline and occasional one-step command to pantomime.  She is unable to name and repeat.  She is able to move the right side of the slightly in the bed.  Vital signs stable.  She has passed swallow eval and is cleared to dysphagia 1 diet. CT scan from this morning shows evolving large left MCA infarct with stable hemorrhagic transformation but increasing edema and 3 mm left-to-right shift. Vitals:   04/24/20 0841 04/24/20 1201 04/24/20 1207 04/24/20 1521  BP: (!) 140/94 (!) 133/94  112/83  Pulse: (!) 110   (!) 112  Resp:  15 23 22   Temp:  98.5 F (36.9 C)  98.7 F (37.1 C)  TempSrc:  Axillary  Axillary  SpO2:  100%  99%  Weight:       CBC:  Recent Labs  Lab 04/21/20 1749  WBC 5.1  NEUTROABS 3.7  HGB 13.0  HCT 39.0  MCV 88.8  PLT 305   Basic Metabolic Panel:  Recent Labs  Lab 04/23/20 0443 04/24/20 1141  NA 139 139  K 3.3* 3.8  CL 103 102  CO2 21* 25  GLUCOSE 157* 145*  BUN 16 20  CREATININE 0.83 0.64  CALCIUM 9.2 9.3  MG  --  1.7   Lipid Panel:  Recent Labs  Lab 04/22/20 0338  CHOL 203*  TRIG 69  HDL 62  CHOLHDL 3.3  VLDL 14  LDLCALC 04/24/20*   HgbA1c:  Recent Labs  Lab 04/22/20 0338  HGBA1C 6.1*   Urine Drug Screen:  Recent Labs  Lab 04/21/20 2000  LABOPIA NONE DETECTED  COCAINSCRNUR NONE DETECTED  LABBENZ NONE DETECTED  AMPHETMU NONE DETECTED  THCU POSITIVE*  LABBARB NONE DETECTED    Alcohol Level  Recent Labs  Lab 04/21/20 1749  ETH <10    IMAGING past 24 hours CT HEAD WO CONTRAST  Result Date: 04/24/2020 CLINICAL DATA:  Stroke follow-up. EXAM: CT HEAD WITHOUT CONTRAST TECHNIQUE: Contiguous axial images were obtained from the base of the skull through the vertex without intravenous contrast.  COMPARISON:  04/22/2020 head CT and 04/21/2020 MRI FINDINGS: Brain: A moderately large left MCA infarct demonstrates further evolution with increasing cytotoxic edema and increased rightward midline shift which now measures 3 mm. There is partial effacement of the left lateral ventricle. Multiple foci of hemorrhage are again seen within the infarct with some appearing more well-defined and perhaps slightly larger compared to the prior CT, however no substantial enlargement is evident compared to the prior MRI. No new infarct or extra-axial fluid collection is identified. There is no evidence of hydrocephalus. A background of mild chronic small vessel ischemia is again noted in the cerebral white matter. Vascular: Calcified atherosclerosis at the skull base. Skull: No fracture or suspicious osseous lesion. Sinuses/Orbits: Opacification of a single right ethmoid air cell. Clear mastoid air cells. Unremarkable orbits. Other: None. IMPRESSION: 1. Evolving left MCA infarct with increasing edema and increased rightward midline shift, now 3 mm. 2. Small hemorrhages within the infarct without significant progression. Electronically Signed   By: 04/23/2020 M.D.   On: 04/24/2020 06:46    PHYSICAL EXAM Pleasant middle-aged African-American lady not in distress. . Afebrile. Head is nontraumatic. Neck is supple without bruit.  Cardiac exam no murmur or gallop. Lungs are clear to auscultation. Distal pulses are well felt. Neurological Exam : Patient is awake alert.  She is globally aphasic.  She speaks only occasional words which are mostly nonsensical.  She can follows some commands to pantomime and occasional midline.  Unable to name repeat or comprehend.  Left gaze preference but can look to the right past midline.  Does not blink to threat on the right but does so on the left.  Right lower facial weakness.  Tongue midline.  Dense right hemiplegia with only trace withdrawal to painful stimuli on the right and  purposeful antigravity movements on the left.  Tone is diminished on the right and normal on the left right plantar upgoing left downgoing.  Gait not tested. ASSESSMENT/PLAN [name] is a 64 y.o. female with history of HTN presenting with slurred speech and right-sided weakness.   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 Mild L lateral ventricle mass effect w/ 30mm midline shift. ASPECTS 4    CTA head & neck L MCA infarct. L M2 occlusion.   MRI  L MCA infarct w/ mild hemorrhagic transformation   Repeat CT head evolving L MCA infarct w/ stable hemorrhage   2D Echo normal ejection fraction.  No clot.  LE doppler no DVT.    LDL 127  HgbA1c 6.1  VTE prophylaxis - added Lovenox 40 mg sq daily (ok in setting of hemorrhagic transformation)  No antithrombotic prior to admission, now on No antithrombotic given hemorrhagic transformation. Ok for aspirin (not ok for [facility]). Will add.     Therapy recommendations:  SLP  Disposition:  pending   Atrial Fibrillation w/ RVR, new dx  CHA2DS2-VASc Score = at least 5, ?2 oral anticoagulation recommended  Age in Years:  <65   0    Sex:  Female   Female   +1    Hypertension History:  yes   +1     Diabetes Mellitus:  yes   +1  Congestive Heart Failure History:  0  Vascular Disease History:  0    Stroke/TIA/Thromboembolism History:  yes   +2 . Not an AC candidate at this time given hemorrhagic transformation of infarct   Hypertension  Home meds:  norvasc 10, atenolol 25  Stable . Permissive hypertension (OK if < 220/120) but gradually normalize in 5-7 days . Long-term BP goal normotensive  Hyperlipidemia  Home meds:  No statin  On lipitor 40  LDL 127, goal < 70  Continue statin at discharge  PreDiabetes   HgbA1c 6.1, goal < 7.0  CBGs Recent Labs    04/24/20 0419 04/24/20 0731 04/24/20 1336  GLUCAP  114* 131* 138*      SSI  Dysphagia . Secondary to stroke . NPO . Speech on board   Other Stroke Risk Factors  Former Cigarette smoker, quit 41 yrs ago  ETOH use, alcohol level <10, advised to drink no more than 1 drink(s) a day. On CIWA protocol  Substance abuse - UDS:  THC POSITIVE, Patient advised to stop using due to stroke risk.  Hospital day # 3  She presented with aphasia and right hemiplegia he presented to late to be considered for thrombolysis or intervention.  She has significant global aphasia and right hemiplegia and prognosis is guarded.   Continue pended tube feeds for now and speech therapy to  follow swallow eval. continue aspirin for stroke prevention for now.  She will need to be started on anticoagulation after 3 to 5 days when her hemorrhagic transformation remained stable repeat CT scan of the head without contrast tomorrow morning..  Discussed with Dr. Jerral Ralph. and patient's daughter and answered questions greater than 50% time during this 25-minute visit was spent on counseling and coordination of care and answering questions.  Recommend continue aspirin for now repeat CT scan in 1 week if edema is resolved and hemorrhagic transformation is resolved may start Eliquis in 7 to 10 days from now.  Stroke team will sign off.  Kindly call for questions.  Transfer to inpatient rehab when bed available. Delia Heady, MD To contact Stroke Continuity provider, please refer to [email or web address]. After hours, contact General Neurology

## 2020-04-24 NOTE — Progress Notes (Signed)
PROGRESS NOTE        PATIENT DETAILS Name: Lindsey [name] Age: 64 y.o. Sex: female Date of Birth: 04/20/1956 Admit Date: 04/21/2020 Admitting Physician Anselm Jungling, DO [id number], Ngwe A, MD  Brief Narrative: Patient is a 64 y.o. female with history of HTN, EtOH use-who presented with right-sided hemiparesis, dysarthria-found to have acute CVA and A. fib with RVR.  Significant events: 7/20>> admit for acute CVA and A. fib with RVR  Significant studies: 7/22>> CT head: Evolving left MCA infarct with increasing edema and increased rightward shift, small hemorrhages within the infarct without significant progression. 7/21>> LDL: 127 7/21>> A1c: 6.1 7/21>> echo: Pending 7/21>> CT head: Evolving left MCA infarct with unchanged small areas of hemorrhage. 7/20>> MRI brain: Acute infarct left MCA territory-mild hemorrhage in the infarct 7/20>> CTA head/neck: Acute occlusion of the inferior division of left M2, no other intracranial/extracranial stenosis.  Antimicrobial therapy: None  Microbiology data: 7/20>> blood culture: No growth  Procedures : None  Consults: Neurology  DVT Prophylaxis : enoxaparin (LOVENOX) injection 40 mg Start: 04/22/20 1100  Subjective: Remains unchanged-dysarthria remains the same-NG tube in place-continues to have dense right-sided hemiplegia.  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 fib.  See work-up results above.  Neurology following-continue ASA for now-repeat CT head as above-discussed with Dr. Pearlean Brownie today-recommends we reach out to Stroke team in a week before we decide on proceeding with anticoagulation.  Await further input from neurology.  Atrial fibrillation with RVR: Rate better controlled-continue metoprolol.  Cardiology following.  Not a candidate for anticoagulation currently due to  hemorrhagic transformation.    Dysphagia: Secondary to CVA-NG tube in place-seems to be tolerating dysphagia 1 diet.  Appreciate SLP eval.  Will reassess over the next few days whether we can discontinue NG tube.  HTN: BP stable-continue metoprolol.  HLD: Continue statin  Hypokalemia: Repleted  Hypomagnesemia: Replete and recheck  EtOH abuse: Per daughter-patient does not drink every day-apparently has gone several days without withdrawal symptoms when not drinking.  Watch closely for withdrawal symptoms-currently none.  Diet: Diet Order            DIET - DYS 1 Room service appropriate? No; Fluid consistency: Nectar Thick  Diet effective now                  Code Status: Full code   Family Communication: Daughter at bedside  Disposition Plan: Status is: Inpatient  Remains inpatient appropriate because:Inpatient level of care appropriate due to severity of illness   Dispo: The patient is from: Home              Anticipated d/c is to: CIR              Anticipated d/c date is: 2 days              Patient currently is not medically stable to d/c.  Barriers to Discharge: Acute CVA with dense right-sided hemiplegia/dysphagia-work-up in progress  Antimicrobial agents: Anti-infectives (From admission, onward)   None       Time spent: 25 minutes-Greater than 50% of this time was spent in counseling, explanation of diagnosis, planning of further management, and coordination of care.  MEDICATIONS: Scheduled Meds: . aspirin  300 mg Rectal Daily   Or  .  aspirin  81 mg Per Tube Daily  . atorvastatin  40 mg Per Tube q1800  . chlorhexidine  15 mL Mouth Rinse BID  . enoxaparin (LOVENOX) injection  40 mg Subcutaneous Q24H  . feeding supplement (OSMOLITE 1.5 CAL)  1,000 mL Per Tube Q24H  . feeding supplement (PROSource TF)  45 mL Per Tube BID  . mouth rinse  15 mL Mouth Rinse q12n4p  . metoprolol tartrate  25 mg Per Tube BID   Continuous Infusions: PRN  Meds:.acetaminophen **OR** acetaminophen (TYLENOL) oral liquid 160 mg/5 mL **OR** acetaminophen, Resource ThickenUp Clear   PHYSICAL EXAM: Vital signs: Vitals:   04/24/20 0731 04/24/20 0800 04/24/20 0841 04/24/20 1201  BP:  (!) 140/94 (!) 140/94 (!) 133/94  Pulse:   (!) 110   Resp:  19  15  Temp: 98.3 F (36.8 C)   98.5 F (36.9 C)  TempSrc: Axillary   Axillary  SpO2:  97%  100%  Weight:       Filed Weights   04/22/20 0100  Weight: 75.8 kg   Body mass index is 27.81 kg/m.   Gen Exam:Alert awake-not in any distress HEENT:atraumatic, normocephalic Chest: B/L clear to auscultation anteriorly CVS:S1S2 regular Abdomen:soft non tender, non distended Extremities:no edema Neurology: Dense right-sided hemiplegia Skin: no rash  I have personally reviewed following labs and imaging studies  LABORATORY DATA: CBC: Recent Labs  Lab 04/21/20 1749  WBC 5.1  NEUTROABS 3.7  HGB 13.0  HCT 39.0  MCV 88.8  PLT 305    Basic Metabolic Panel: Recent Labs  Lab 04/21/20 1749 04/23/20 0443 04/24/20 1141  NA 137 139 139  K 4.0 3.3* 3.8  CL 102 103 102  CO2 19* 21* 25  GLUCOSE 128* 157* 145*  BUN 7* 16 20  CREATININE 0.70 0.83 0.64  CALCIUM 9.2 9.2 9.3  MG  --   --  1.7    GFR: CrCl cannot be calculated (Unknown ideal weight.).  Liver Function Tests: Recent Labs  Lab 04/21/20 1749  AST 56*  ALT 38  ALKPHOS 79  BILITOT 1.4*  PROT 8.4*  ALBUMIN 3.8   No results for input(s): LIPASE, AMYLASE in the last 168 hours. No results for input(s): AMMONIA in the last 168 hours.  Coagulation Profile: Recent Labs  Lab 04/21/20 1749  INR 1.1    Cardiac Enzymes: No results for input(s): CKTOTAL, CKMB, CKMBINDEX, TROPONINI in the last 168 hours.  BNP (last 3 results) No results for input(s): PROBNP in the last 8760 hours.  Lipid Profile: Recent Labs    04/22/20 0338  CHOL 203*  HDL 62  LDLCALC 127*  TRIG 69  CHOLHDL 3.3    Thyroid Function Tests: No  results for input(s): TSH, T4TOTAL, FREET4, T3FREE, THYROIDAB in the last 72 hours.  Anemia Panel: No results for input(s): VITAMINB12, FOLATE, FERRITIN, TIBC, IRON, RETICCTPCT in the last 72 hours.  Urine analysis:    Component Value Date/Time   COLORURINE YELLOW 04/21/2020 2000   APPEARANCEUR CLEAR 04/21/2020 2000   APPEARANCEUR Clear 08/17/2014 0948   LABSPEC 1.042 (H) 04/21/2020 2000   LABSPEC 1.018 08/17/2014 0948   PHURINE 6.0 04/21/2020 2000   GLUCOSEU NEGATIVE 04/21/2020 2000   GLUCOSEU Negative 08/17/2014 0948   HGBUR NEGATIVE 04/21/2020 2000   BILIRUBINUR NEGATIVE 04/21/2020 2000   BILIRUBINUR Negative 08/17/2014 0948   KETONESUR 20 (A) 04/21/2020 2000   PROTEINUR NEGATIVE 04/21/2020 2000   NITRITE NEGATIVE 04/21/2020 2000   LEUKOCYTESUR NEGATIVE 04/21/2020 2000   LEUKOCYTESUR Negative  08/17/2014 0948    Sepsis Labs: Lactic Acid, Venous No results found for: LATICACIDVEN  MICROBIOLOGY: Recent Results (from the past 240 hour(s))  SARS Coronavirus 2 by RT PCR (hospital order, performed in [facility] hospital lab) Nasopharyngeal Nasopharyngeal Swab     Status: None   Collection Time: 04/21/20  6:51 PM   Specimen: Nasopharyngeal Swab  Result Value Ref Range Status   SARS Coronavirus 2 NEGATIVE NEGATIVE Final    Comment: (NOTE) SARS-CoV-2 target nucleic acids are NOT DETECTED.  The SARS-CoV-2 RNA is generally detectable in upper and lower respiratory specimens during the acute phase of infection. The lowest concentration of SARS-CoV-2 viral copies this assay can detect is 250 copies / mL. A negative result does not preclude SARS-CoV-2 infection and should not be used as the sole basis for treatment or other patient management decisions.  A negative result may occur with improper specimen collection / handling, submission of specimen other than nasopharyngeal swab, presence of viral mutation(s) within the areas targeted by this assay, and inadequate number of  viral copies (<250 copies / mL). A negative result must be combined with clinical observations, patient history, and epidemiological information.  Fact Sheet for Patients:   BoilerBrush.com.cyhttps://www.fda.gov/media/136312/download  Fact Sheet for Healthcare Providers: https://pope.com/https://www.fda.gov/media/136313/download  This test is not yet approved or  cleared by the [place] FDA and has been authorized for detection and/or diagnosis of SARS-CoV-2 by FDA under an Emergency Use Authorization (EUA).  This EUA will remain in effect (meaning this test can be used) for the duration of the COVID-19 declaration under Section 564(b)(1) of the Act, 21 U.S.C. section 360bbb-3(b)(1), unless the authorization is terminated or revoked sooner.  Performed at [facility], [address] [place], [place] [address]   Culture, blood (Routine X 2) w Reflex to ID Panel     Status: None (Preliminary result)   Collection Time: 04/21/20  8:25 PM   Specimen: BLOOD RIGHT HAND  Result Value Ref Range Status   Specimen Description BLOOD RIGHT HAND  Final   Special Requests   Final    BOTTLES DRAWN AEROBIC AND ANAEROBIC Blood Culture adequate volume   Culture   Final    NO GROWTH 3 DAYS Performed at [facility], [address] [place], [place] [address]    Report Status PENDING  Incomplete  MRSA PCR Screening     Status: None   Collection Time: 04/22/20  1:17 AM   Specimen: Nasal Mucosa; Nasopharyngeal  Result Value Ref Range Status   MRSA by PCR NEGATIVE NEGATIVE Final    Comment:        The GeneXpert MRSA Assay (FDA approved for NASAL specimens only), is one component of a comprehensive MRSA colonization surveillance program. It is not intended to diagnose MRSA infection nor to guide or monitor treatment for MRSA infections. Performed at [facility], [address] [place], [place] [address]     RADIOLOGY STUDIES/RESULTS: CT HEAD WO CONTRAST  Result Date: 04/24/2020 CLINICAL DATA:   Stroke follow-up. EXAM: CT HEAD WITHOUT CONTRAST TECHNIQUE: Contiguous axial images were obtained from the base of the skull through the vertex without intravenous contrast. COMPARISON:  04/22/2020 head CT and 04/21/2020 MRI FINDINGS: Brain: A moderately large left MCA infarct demonstrates further evolution with increasing cytotoxic edema and increased rightward midline shift which now measures 3 mm. There is partial effacement of the left lateral ventricle. Multiple foci of hemorrhage are again seen within the infarct with some appearing  more well-defined and perhaps slightly larger compared to the prior CT, however no substantial enlargement is evident compared to the prior MRI. No new infarct or extra-axial fluid collection is identified. There is no evidence of hydrocephalus. A background of mild chronic small vessel ischemia is again noted in the cerebral white matter. Vascular: Calcified atherosclerosis at the skull base. Skull: No fracture or suspicious osseous lesion. Sinuses/Orbits: Opacification of a single right ethmoid air cell. Clear mastoid air cells. Unremarkable orbits. Other: None. IMPRESSION: 1. Evolving left MCA infarct with increasing edema and increased rightward midline shift, now 3 mm. 2. Small hemorrhages within the infarct without significant progression. Electronically Signed   By: Sebastian Ache M.D.   On: 04/24/2020 06:46     LOS: 3 days   Jeoffrey Massed, MD  [facility]    To contact the attending provider between 7A-7P or the covering provider during after hours 7P-7A, please log into the web site [email or web address] and access using universal [place] password for that web site. If you do not have the password, please call the hospital operator.  04/24/2020, 2:54 PM

## 2020-04-24 NOTE — Progress Notes (Signed)
Physical Therapy Treatment Patient Details Name: Lindsey [name] MRN: [id number] DOB: [birth year] Today's Date: 04/24/2020    History of Present Illness Pt is a 64 y.o. female admitted 04/21/20 with R weakness, facial droop, and severe dysarthria. CT and MRI brain reveal large L MCA, initially ischemic but with some hemorrhagic conversion. CT scan 7/23 shows evolving large L MCA infarct with stable hemorrhagic transformation but increasing edema and 3 mm left-to-right shift. PMH includes HTN, afib with RVR.   PT Comments    Pt seen for bed-level activity secondary to tachycardia jumping from 90s-140s with minimal activity. Daughter present for education and motivated to participate in her mother's care. Pt tolerated RLE/RUE PROM well (daughter educ on performing this as well as positioning for edema control and pressure relief); pt demonstrated toe/ankle and hip flexor muscle contraction, although non-purposeful and inconsistent. Able to briefly turn head towards R-side, although gaze does not cross midline. Decreased command following although pt becoming more alert with activity. Continue to recommend intensive CIR-level therapies to maximize functional mobility and independence prior to return home.    Follow Up Recommendations  CIR;Supervision/Assistance - 24 hour     Equipment Recommendations   (TBD)    Recommendations for Other Services       Precautions / Restrictions Precautions Precautions: Fall Precaution Comments: R neglect, R hemi Restrictions Weight Bearing Restrictions: No    Mobility  Bed Mobility Overal bed mobility: Needs Assistance             General bed mobility comments: While supine, pt able to reposition self with LUE/LLE well and adjust trunk to midline without cues. Deferred sitting EOB or OOB secondary to HR up to 140s with supine activity  Transfers                 General transfer comment: not assessed this day  Ambulation/Gait                  Stairs             Wheelchair Mobility    Modified Rankin (Stroke Patients Only) Modified Rankin (Stroke Patients Only) Pre-Morbid Rankin Score: No symptoms Modified Rankin: Severe disability     Balance                                            Cognition Arousal/Alertness: Lethargic;Awake/alert Behavior During Therapy: Flat affect Overall Cognitive Status: Difficult to assess Area of Impairment: Following commands;Safety/judgement;Awareness;Problem solving                       Following Commands: Follows one step commands inconsistently;Follows one step commands with increased time Safety/Judgement: Decreased awareness of deficits;Decreased awareness of safety Awareness: Intellectual Problem Solving: Slow processing;Difficulty sequencing;Decreased initiation;Requires verbal cues;Requires tactile cues General Comments: Pt initially asleep, lethargic for a few minutes upon awakening, but more alert with BLE PROM and stimulation. No verbalizations until end of session mumble; daughter reports pt has been talking to her throughout day. Follows simple commands and automatic tasks with LUE/LLE      Exercises General Exercises - Upper Extremity Shoulder Flexion: Self ROM;AAROM;Both;20 reps Shoulder Extension: AAROM;Self ROM;20 reps;Both Elbow Flexion: Self ROM;20 reps;Both Elbow Extension: Self ROM;Both;20 reps Wrist Flexion: Self ROM;Both;20 reps Wrist Extension: Self ROM;Both;20 reps Other Exercises Other Exercises: Daughter present for education on RUE/RLE PROM - noted R toe/ankle movement and  hip flexor contraction inconsistent, not purposeful or able to consistently recreate with PROM Other Exercises: LLE SAQ and hip flex    General Comments General comments (skin integrity, edema, etc.): HR ranging 100-132 while supine in bed with HOB elevated, RN aware      Pertinent Vitals/Pain Pain Assessment: Faces Faces Pain Scale:  Hurts a little bit Pain Location: RLE with PROM Pain Descriptors / Indicators: Grimacing Pain Intervention(s): Monitored during session    Home Living                      Prior Function            PT Goals (current goals can now be found in the care plan section) Acute Rehab PT Goals Patient Stated Goal: states "yes indeed" to PT stating "we will keep working on your mobility and strength" Progress towards PT goals: Not progressing toward goals - comment (limited by tachycardia)    Frequency    Min 4X/week      PT Plan Current plan remains appropriate    Co-evaluation              AM-PAC PT "6 Clicks" Mobility   Outcome Measure  Help needed turning from your back to your side while in a flat bed without using bedrails?: A Lot Help needed moving from lying on your back to sitting on the side of a flat bed without using bedrails?: A Lot Help needed moving to and from a bed to a chair (including a wheelchair)?: Total Help needed standing up from a chair using your arms (e.g., wheelchair or bedside chair)?: Total Help needed to walk in hospital room?: Total Help needed climbing 3-5 steps with a railing? : Total 6 Click Score: 8    End of Session   Activity Tolerance: Treatment limited secondary to medical complications (Comment) Patient left: in bed;with call bell/phone within reach;with bed alarm set;with family/visitor present Nurse Communication: Mobility status PT Visit Diagnosis: Other abnormalities of gait and mobility (R26.89);Hemiplegia and hemiparesis Hemiplegia - Right/Left: Right Hemiplegia - dominant/non-dominant: Dominant Hemiplegia - caused by: Cerebral infarction     Time: 1548-1610 PT Time Calculation (min) (ACUTE ONLY): 22 min  Charges:  $Therapeutic Exercise: 8-22 mins                     Ina Homes, PT, DPT Acute Rehabilitation Services  Pager [phone] Office [phone]  Malachy Chamber 04/24/2020, 5:16 PM

## 2020-04-24 NOTE — Progress Notes (Signed)
Extensive conversation with daughter today about family visiting that lives in [place]. The daughter states that 6 family members from [place] will be in town 3 days mid August and would like to come to visit Lindsey [name]. Permission given for the family members to come, 2 on one day, 2 on the second day and 2 on the 3rd day. These family members will be returning to [place] after this one visit and it will not be an issue thereafter.  The 6 family members are: [name] [name] [name] [name] This is a one time permission for this visitation.

## 2020-04-24 NOTE — Progress Notes (Signed)
   04/24/20 1521  Assess: MEWS Score  Temp 98.7 F (37.1 C)  BP 112/83  Pulse Rate (!) 112  Resp 22  SpO2 99 %  Assess: MEWS Score  MEWS Temp 0  MEWS Systolic 0  MEWS Pulse 2  MEWS RR 1  MEWS LOC 0  MEWS Score 3  MEWS Score Color Yellow  Assess: if the MEWS score is Yellow or Red  Were vital signs taken at a resting state? Yes  Focused Assessment No change from prior assessment  Early Detection of Sepsis Score *See Row Information* Low  MEWS guidelines implemented *See Row Information* Yes  Take Vital Signs  Increase Vital Sign Frequency  Yellow: Q 2hr X 2 then Q 4hr X 2, if remains yellow, continue Q 4hrs  Escalate  MEWS: Escalate Yellow: discuss with charge nurse/RN and consider discussing with provider and RRT  Notify: Charge Nurse/RN  Name of Charge Nurse/RN Notified Hunt[place] Dec  Date Charge Nurse/RN Notified 04/24/20  Time Charge Nurse/RN Notified 1535  Notify: Provider  Provider Name/Title Jeoffrey Massed   Date Provider Notified 04/24/20  Time Provider Notified 1535  Notification Type Face-to-face  Notification Reason Other (Comment) (To notify in MEWS change)  Response No new orders  Date of Provider Response 04/24/20  Time of Provider Response 1536  Document  Progress note created (see row info) Yes

## 2020-04-24 NOTE — Progress Notes (Signed)
Occupational Therapy Treatment Patient Details Name: Lindsey [name] MRN: [id number] DOB: [birth year] Today's Date: 04/24/2020    History of present illness 64 yo female admitted to ED on 7/20 with R weakness, facial droop, and severe dysarthria. CT and MRI brain reveal large L MCA, initially ischemic but with some hemorrhagic conversion. PMH includes HTN, afib with RVR.   OT comments  Patient in bed with daughter in room.  Session today focusing on increasing R attention, scanning, and coordination.  Educated patient and daughter on self ROM, clasping hands at midline and using L to promote R movement and attention.  Worked on using L index finger to point at shapes on paper.  Heavy education and practice on scanning to right side of paper and finding objects.  Patient's command following has improved and overall is more responsive today than previous session.  Will continue to follow with OT acutely to address the deficits listed below.     Follow Up Recommendations  CIR;Supervision/Assistance - 24 hour    Equipment Recommendations  Other (comment) (defer to next venue)    Recommendations for Other Services      Precautions / Restrictions Precautions Precautions: Fall Precaution Comments: R neglect, R hemi Restrictions Weight Bearing Restrictions: No       Mobility Bed Mobility               General bed mobility comments: Differed EOB today as patients HR elevated in supine   Transfers                 General transfer comment: not assessed this day    Balance                                           ADL either performed or assessed with clinical judgement   ADL Overall ADL's : Needs assistance/impaired     Grooming: Wash/dry hands;Set up;Bed level Grooming Details (indicate cue type and reason): Using L hand                               General ADL Comments: Session focused on scanning, increasing R side attention, and ROM  exercises     Vision   Additional Comments: Not able to move eyes past midline to R side.  When looking at paper at midline, patient identifying object ~10 degrees to R of midline as farthest to R she can see.  Formal assessment not yet completed.   Perception     Praxis      Cognition Arousal/Alertness: Awake/alert Behavior During Therapy: Flat affect Overall Cognitive Status: Difficult to assess Area of Impairment: Following commands;Safety/judgement;Awareness;Problem solving                       Following Commands: Follows one step commands inconsistently;Follows one step commands with increased time Safety/Judgement: Decreased awareness of deficits;Decreased awareness of safety Awareness: Intellectual Problem Solving: Slow processing;Difficulty sequencing;Decreased initiation;Requires verbal cues;Requires tactile cues General Comments: Patient's general cognition has improved since previous visit. Following commands more consistently.  Motor planning and imitation are still difficult.  Patient drawing shapes today, asked to draw a cross, square, heart, and circle and last three all looked like the letter "D", able to do a cross.         Exercises Exercises: Other exercises;General Upper  Extremity General Exercises - Upper Extremity Shoulder Flexion: Self ROM;AAROM;Both;20 reps Shoulder Extension: AAROM;Self ROM;20 reps;Both Elbow Flexion: Self ROM;20 reps;Both Elbow Extension: Self ROM;Both;20 reps Wrist Flexion: Self ROM;Both;20 reps Wrist Extension: Self ROM;Both;20 reps   Shoulder Instructions       General Comments HR ranging 100-132 while supine in bed with HOB elevated, RN aware    Pertinent Vitals/ Pain       Pain Assessment: Faces Faces Pain Scale: No hurt  Home Living                                          Prior Functioning/Environment              Frequency  Min 2X/week        Progress Toward Goals  OT  Goals(current goals can now be found in the care plan section)  Progress towards OT goals: Progressing toward goals  Acute Rehab OT Goals Patient Stated Goal: states "yes indeed" to PT stating "we will keep working on your mobility and strength" OT Goal Formulation: With patient/family Time For Goal Achievement: 05/06/20 Potential to Achieve Goals: Good  Plan Discharge plan remains appropriate    Co-evaluation                 AM-PAC OT "6 Clicks" Daily Activity     Outcome Measure   Help from another person eating meals?: Total Help from another person taking care of personal grooming?: A Lot Help from another person toileting, which includes using toliet, bedpan, or urinal?: Total Help from another person bathing (including washing, rinsing, drying)?: Total Help from another person to put on and taking off regular upper body clothing?: Total Help from another person to put on and taking off regular lower body clothing?: Total 6 Click Score: 7    End of Session    OT Visit Diagnosis: Unsteadiness on feet (R26.81);Other abnormalities of gait and mobility (R26.89);Muscle weakness (generalized) (M62.81);Other symptoms and signs involving the nervous system (R29.898);Other symptoms and signs involving cognitive function;Feeding difficulties (R63.3);Hemiplegia and hemiparesis;Cognitive communication deficit (R41.841) Symptoms and signs involving cognitive functions: Cerebral infarction Hemiplegia - Right/Left: Right Hemiplegia - dominant/non-dominant: Dominant Hemiplegia - caused by: Cerebral infarction   Activity Tolerance Patient tolerated treatment well   Patient Left in bed;with call bell/phone within reach;with bed alarm set;with family/visitor present   Nurse Communication Mobility status        Time: 1478-2956 OT Time Calculation (min): 28 min  Charges: OT General Charges $OT Visit: 1 Visit OT Treatments $Therapeutic Activity: 8-22 mins $Therapeutic Exercise:  8-22 mins  Barbie Banner, OTR/L    Lindsey [name] 04/24/2020, 3:08 PM

## 2020-04-24 NOTE — Progress Notes (Signed)
  Speech Language Pathology Treatment: Dysphagia  Patient Details Name: Lindsey [name] MRN: [id number] DOB: 25-Feb-1956 Today's Date: 04/24/2020 Time: 2641-5830 SLP Time Calculation (min) (ACUTE ONLY): 11 min  Assessment / Plan / Recommendation Clinical Impression  Pt demonstrates excellent tolerance of nectar thick liquids self fed by straw and pudding also self fed with a spoon. Occasional verbal cues needed to clear oral residue. Application of suction to right buccal cavity also helpful.  Pt able to direct attention to midline to find RUE positioned to hold pudding. When thin liquids were trialed pt continues to demonstrate signs of aspiration. Recommend pt continue a puree and nectar thick diet. For some reason, trays have not been ordered/have not been arriving so pt has been given much PO. Discussed with RN. Will need to establish adequate intake so Cortrak can be removed. Brief session today, hoped to return to address speech more directly, but will plan to f/u next week. Recommend CIR  HPI HPI: Lindsey [name] is a 64 y.o. female with past medical history significant for hypertension presents emergency department with slurred speech and right-sided weakness noted by family today. Last seen normal by her daughter was 4:30 PM yesterday.  The daughter works evening shift and prior to her leaving patient was drinking.  This morning patient did not get up, the granddaughter noticed patient speech was gibberish but thought this was due to her being intoxicated.  Patient was in bed all day and could not getting words out and daughter finally decided to call EMS.  Patient did not arrive as a code stroke as she was outside the 24-hour window. On arrival patient had left gaze deviation, right-sided weakness and was aphasic and severely dysarthric following only some commands.  Stat CT head was obtained which showed large acute left MCA stroke, CTA confirmed a left M2 occlusion.  Unfortunately patient not a  candidate for thrombectomy due to being outside the window and this is an established infarct.      SLP Plan  Continue with current plan of care       Recommendations  Diet recommendations: Dysphagia 1 (puree);Nectar-thick liquid Liquids provided via: Straw Supervision: Staff to assist with self feeding Compensations: Slow rate;Small sips/bites;Minimize environmental distractions Postural Changes and/or Swallow Maneuvers: Seated upright 90 degrees                Follow up Recommendations: Inpatient Rehab SLP Visit Diagnosis: Aphasia (R47.01);Dysphagia, oropharyngeal phase (R13.12) Plan: Continue with current plan of care       GO                Lindsey [name], Riley Nearing 04/24/2020, 12:42 PM

## 2020-04-24 NOTE — TOC Progression Note (Signed)
Transition of Care [facility]) - Progression Note    Patient Details  Name: Lindsey [name] MRN: [id number] Date of Birth: [birth year]  Transition of Care [facility]) CM/SW Contact  Lockie Pares, RN Phone Number: 04/24/2020, 5:56 PM  Clinical Narrative:    Followed up with Ms [name] daughter of patient left message She was applying for Medicaid  Yesterday wanted to make sure she was successful and if she had any questions.    Expected Discharge Plan: IP Rehab Facility Barriers to Discharge: Continued Medical Work up  Expected Discharge Plan and Services Expected Discharge Plan: IP Rehab Facility                                               Social Determinants of Health (SDOH) Interventions    Readmission Risk Interventions No flowsheet data found.

## 2020-04-25 LAB — BASIC METABOLIC PANEL
Anion gap: 10 (ref 5–15)
BUN: 18 mg/dL (ref 8–23)
CO2: 24 mmol/L (ref 22–32)
Calcium: 9.1 mg/dL (ref 8.9–10.3)
Chloride: 106 mmol/L (ref 98–111)
Creatinine, Ser: 0.64 mg/dL (ref 0.44–1.00)
GFR calc Af Amer: >60 mL/min (ref >60)
GFR calc non Af Amer: >60 mL/min (ref >60)
Glucose, Bld: 146 mg/dL — ABNORMAL HIGH (ref 70–99)
Potassium: 4.3 mmol/L (ref 3.5–5.1)
Sodium: 140 mmol/L (ref 135–145)

## 2020-04-25 LAB — GLUCOSE, CAPILLARY
Glucose-Capillary: 112 mg/dL — ABNORMAL HIGH (ref 70–99)
Glucose-Capillary: 350 mg/dL — ABNORMAL HIGH (ref 70–99)
Glucose-Capillary: 136 mg/dL — ABNORMAL HIGH (ref 70–99)
Glucose-Capillary: 154 mg/dL — ABNORMAL HIGH (ref 70–99)
Glucose-Capillary: 142 mg/dL — ABNORMAL HIGH (ref 70–99)
Glucose-Capillary: 416 mg/dL — ABNORMAL HIGH (ref 70–99)
Glucose-Capillary: 207 mg/dL — ABNORMAL HIGH (ref 70–99)

## 2020-04-25 LAB — MAGNESIUM: Magnesium: 2 mg/dL (ref 1.7–2.4)

## 2020-04-25 MED ORDER — INSULIN ASPART 100 UNIT/ML ~~LOC~~ SOLN
10.0000 [IU] | Freq: Once | SUBCUTANEOUS | Status: AC
  Administered 2020-04-25: 10 [IU] via SUBCUTANEOUS

## 2020-04-25 NOTE — Plan of Care (Signed)
  Problem: Clinical Measurements: Goal: Ability to maintain clinical measurements within normal limits will improve Outcome: Progressing Goal: Will remain free from infection Outcome: Progressing Goal: Respiratory complications will improve Outcome: Progressing Goal: Cardiovascular complication will be avoided Outcome: Progressing   Problem: Nutrition: Goal: Adequate nutrition will be maintained Outcome: Progressing   Problem: Coping: Goal: Level of anxiety will decrease Outcome: Progressing   Problem: Elimination: Goal: Will not experience complications related to bowel motility Outcome: Progressing Goal: Will not experience complications related to urinary retention Outcome: Progressing   Problem: Pain Managment: Goal: General experience of comfort will improve Outcome: Progressing   Problem: Safety: Goal: Ability to remain free from injury will improve Outcome: Progressing   Problem: Skin Integrity: Goal: Risk for impaired skin integrity will decrease Outcome: Progressing   Problem: Intracerebral Hemorrhage Tissue Perfusion: Goal: Complications of Intracerebral Hemorrhage will be minimized Outcome: Progressing   Problem: Education: Goal: Knowledge of disease or condition will improve Outcome: Not Progressing Goal: Knowledge of secondary prevention will improve Outcome: Not Progressing Goal: Knowledge of patient specific risk factors addressed and post discharge goals established will improve Outcome: Not Progressing Goal: Individualized Educational Video(s) Outcome: Not Progressing   Problem: Coping: Goal: Will verbalize positive feelings about self Outcome: Not Progressing Goal: Will identify appropriate support needs Outcome: Not Progressing   Problem: Self-Care: Goal: Verbalization of feelings and concerns over difficulty with self-care will improve Outcome: Not Progressing Goal: Ability to communicate needs accurately will improve Outcome: Not  Progressing   Problem: Education: Goal: Knowledge of General Education information will improve Description: Including pain rating scale, medication(s)/side effects and non-pharmacologic comfort measures Outcome: Not Progressing   Problem: Health Behavior/Discharge Planning: Goal: Ability to manage health-related needs will improve Outcome: Not Progressing   Problem: Clinical Measurements: Goal: Diagnostic test results will improve Outcome: Not Progressing   Problem: Activity: Goal: Risk for activity intolerance will decrease Outcome: Not Progressing

## 2020-04-25 NOTE — Progress Notes (Signed)
Progress Note  Patient Name: Lindsey [name] Date of Encounter: 04/25/2020  Primary Cardiologist: No primary care provider on file.   Subjective   Lying in bed after lunch. Speech improving.  Inpatient Medications    Scheduled Meds:  aspirin  300 mg Rectal Daily   Or   aspirin  81 mg Per Tube Daily   atorvastatin  40 mg Per Tube q1800   chlorhexidine  15 mL Mouth Rinse BID   enoxaparin (LOVENOX) injection  40 mg Subcutaneous Q24H   feeding supplement (OSMOLITE 1.5 CAL)  1,000 mL Per Tube Q24H   feeding supplement (PROSource TF)  45 mL Per Tube BID   mouth rinse  15 mL Mouth Rinse q12n4p   metoprolol tartrate  25 mg Per Tube BID   Continuous Infusions:  PRN Meds: acetaminophen **OR** acetaminophen (TYLENOL) oral liquid 160 mg/5 mL **OR** acetaminophen, Resource ThickenUp Clear   Vital Signs    Vitals:   04/25/20 0331 04/25/20 0434 04/25/20 0800 04/25/20 1200  BP: (!) 146/98  (!) 139/88   Pulse: 100 90 100   Resp: 17 14 19    Temp: 99.5 F (37.5 C)  98.4 F (36.9 C) 97.8 F (36.6 C)  TempSrc: Axillary  Oral Oral  SpO2: 98% 97% 98%   Weight:  76.7 kg      Intake/Output Summary (Last 24 hours) at 04/25/2020 1234 Last data filed at 04/25/2020 0900 Gross per 24 hour  Intake 3289.17 ml  Output 950 ml  Net 2339.17 ml   Filed Weights   04/22/20 0100 04/25/20 0434  Weight: 75.8 kg 76.7 kg    Telemetry    afib rates 90-110  - Personally Reviewed  ECG    No new - Personally Reviewed  Physical Exam   GEN: No acute distress.   Neck: No JVD Cardiac: irregular rhythm, tachycardic to normal, no murmurs, rubs, or gallops.  Respiratory: Clear to auscultation bilaterally. GI: Soft, nontender, non-distended  MS: No edema; No deformity. Neuro:  right hemiparesis, speech improving.  Psych: Normal affect   Labs    Chemistry Recent Labs  Lab 04/21/20 1749 04/21/20 1749 04/23/20 0443 04/24/20 1141 04/25/20 0920  NA 137   < > 139 139 140  K 4.0   <  > 3.3* 3.8 4.3  CL 102   < > 103 102 106  CO2 19*   < > 21* 25 24  GLUCOSE 128*   < > 157* 145* 146*  BUN 7*   < > 16 20 18   CREATININE 0.70   < > 0.83 0.64 0.64  CALCIUM 9.2   < > 9.2 9.3 9.1  PROT 8.4*  --   --   --   --   ALBUMIN 3.8  --   --   --   --   AST 56*  --   --   --   --   ALT 38  --   --   --   --   ALKPHOS 79  --   --   --   --   BILITOT 1.4*  --   --   --   --   GFRNONAA >60   < > >60 >60 >60  GFRAA >60   < > >60 >60 >60  ANIONGAP 16*   < > 15 12 10    < > = values in this interval not displayed.     Hematology Recent Labs  Lab 04/21/20 1749  WBC 5.1  RBC 4.39  HGB 13.0  HCT 39.0  MCV 88.8  MCH 29.6  MCHC 33.3  RDW 14.8  PLT 305    Cardiac EnzymesNo results for input(s): TROPONINI in the last 168 hours. No results for input(s): TROPIPOC in the last 168 hours.   BNPNo results for input(s): BNP, PROBNP in the last 168 hours.   DDimer No results for input(s): DDIMER in the last 168 hours.   Radiology    CT HEAD WO CONTRAST  Result Date: 04/24/2020 CLINICAL DATA:  Stroke follow-up. EXAM: CT HEAD WITHOUT CONTRAST TECHNIQUE: Contiguous axial images were obtained from the base of the skull through the vertex without intravenous contrast. COMPARISON:  04/22/2020 head CT and 04/21/2020 MRI FINDINGS: Brain: A moderately large left MCA infarct demonstrates further evolution with increasing cytotoxic edema and increased rightward midline shift which now measures 3 mm. There is partial effacement of the left lateral ventricle. Multiple foci of hemorrhage are again seen within the infarct with some appearing more well-defined and perhaps slightly larger compared to the prior CT, however no substantial enlargement is evident compared to the prior MRI. No new infarct or extra-axial fluid collection is identified. There is no evidence of hydrocephalus. A background of mild chronic small vessel ischemia is again noted in the cerebral white matter. Vascular: Calcified  atherosclerosis at the skull base. Skull: No fracture or suspicious osseous lesion. Sinuses/Orbits: Opacification of a single right ethmoid air cell. Clear mastoid air cells. Unremarkable orbits. Other: None. IMPRESSION: 1. Evolving left MCA infarct with increasing edema and increased rightward midline shift, now 3 mm. 2. Small hemorrhages within the infarct without significant progression. Electronically Signed   By: Sebastian Ache M.D.   On: 04/24/2020 06:46    Cardiac Studies    Patient Profile       Assessment & Plan   Principal Problem:   CVA (cerebral vascular accident) [facility]) Active Problems:   Unspecified atrial fibrillation (HCC)   Essential hypertension   Alcohol abuse   1. Atrial flutter/atrial fibrillation - continue metoprolol per tube, uptitrate as able with permissive HTN. Rates well controlled currently. - defer decision about anticoagulation to neurology, not appropriate for [facility] currently, [facility]SVASC 5 (female, HTN, DM, stroke x 2) - avoiding rhythm control strategy currently due to inability to use AC.  2. ACUTE CVA - L MCA with hemorrhagic conversion and midline shift -atorvastatin - per neurology - aspirin added by neurology  3. HTN - permissive HTN per neurology  4. HLD - statin  5. Prediabetes      For questions or updates, please contact [facility] Please consult [email or web address] for contact info under        Signed, Parke Poisson, MD

## 2020-04-25 NOTE — Progress Notes (Signed)
PROGRESS NOTE        PATIENT DETAILS Name: Lindsey [name] Age: 64 y.o. Sex: female Date of Birth: [birth year] Admit Date: 04/21/2020 Admitting Physician Anselm Jungling, DO QJJ:HERDEY, Ngwe A, MD  Brief Narrative: Patient is a 64 y.o. female with history of HTN, EtOH use-who presented with right-sided hemiparesis, dysarthria-found to have acute CVA and A. fib with RVR.  Significant events: 7/20>> admit for acute CVA and A. fib with RVR  Significant studies: 7/22>> CT head: Evolving left MCA infarct with increasing edema and increased rightward shift, small hemorrhages within the infarct without significant progression. 7/21>> LDL: 127 7/21>> A1c: 6.1 7/21>> echo: Pending 7/21>> CT head: Evolving left MCA infarct with unchanged small areas of hemorrhage. 7/20>> MRI brain: Acute infarct left MCA territory-mild hemorrhage in the infarct 7/20>> CTA head/neck: Acute occlusion of the inferior division of left M2, no other intracranial/extracranial stenosis.  Antimicrobial therapy: None  Microbiology data: 7/20>> blood culture: No growth  Procedures : None  Consults: Neurology  DVT Prophylaxis : enoxaparin (LOVENOX) injection 40 mg Start: 04/22/20 1100  Subjective: Slight improvement in her speech today but otherwise no major issues overnight.  Acknowledges me-able to say this morning-some amount of dysarthria continues.  No chest pain or shortness of breath.  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 fib.  See work-up results above.  Neurology following-continue ASA for now-repeat CT head on 7/23-reviewed with Dr. Bascom Levels recommends that we reach out to the stroke team 1 week from 7/23-before proceeding with anticoagulation.   Atrial fibrillation with RVR: Rate better controlled-continue metoprolol.  Cardiology following.  Not a  candidate for anticoagulation currently due to hemorrhagic transformation.    Dysphagia: Secondary to CVA-NG tube in place-seems to be tolerating dysphagia 1 diet.  Appreciate SLP eval.  Will reassess over the next few days whether we can discontinue NG tube.  Per nursing staff-tolerating dysphagia 1 diet very well.  HTN: BP stable-continue metoprolol.  HLD: Continue statin  Hypokalemia: Repleted  Hypomagnesemia: Repleted  EtOH abuse: Per daughter-patient does not drink every day-apparently has gone several days without withdrawal symptoms when not drinking.  Watch closely for withdrawal symptoms-currently none.  Diet: Diet Order            DIET - DYS 1 Room service appropriate? No; Fluid consistency: Nectar Thick  Diet effective now                  Code Status: Full code   Family Communication: Daughter over the phone.  Disposition Plan: Status is: Inpatient  Remains inpatient appropriate because:Inpatient level of care appropriate due to severity of illness   Dispo: The patient is from: Home              Anticipated d/c is to: [facility]              Anticipated d/c date is: 2 days              Patient currently is not medically stable to d/c.  Barriers to Discharge: Acute CVA with dense right-sided hemiplegia/dysphagia-work-up in progress  Antimicrobial agents: Anti-infectives (From admission, onward)   None       Time spent: 25 minutes-Greater than 50% of this time was spent in counseling, explanation of diagnosis, planning of further management,  and coordination of care.  MEDICATIONS: Scheduled Meds: . aspirin  300 mg Rectal Daily   Or  . aspirin  81 mg Per Tube Daily  . atorvastatin  40 mg Per Tube q1800  . chlorhexidine  15 mL Mouth Rinse BID  . enoxaparin (LOVENOX) injection  40 mg Subcutaneous Q24H  . feeding supplement (OSMOLITE 1.5 CAL)  1,000 mL Per Tube Q24H  . feeding supplement (PROSource TF)  45 mL Per Tube BID  . mouth rinse  15 mL Mouth Rinse  q12n4p  . metoprolol tartrate  25 mg Per Tube BID   Continuous Infusions: PRN Meds:.acetaminophen **OR** acetaminophen (TYLENOL) oral liquid 160 mg/5 mL **OR** acetaminophen, Resource ThickenUp Clear   PHYSICAL EXAM: Vital signs: Vitals:   04/25/20 0331 04/25/20 0434 04/25/20 0800 04/25/20 1200  BP: (!) 146/98  (!) 139/88   Pulse: 100 90 100   Resp: 17 14 19    Temp: 99.5 F (37.5 C)  98.4 F (36.9 C) 97.8 F (36.6 C)  TempSrc: Axillary  Oral Oral  SpO2: 98% 97% 98%   Weight:  76.7 kg     Filed Weights   04/22/20 0100 04/25/20 0434  Weight: 75.8 kg 76.7 kg   Body mass index is 28.14 kg/m.   Gen Exam:Alert awake-not in any distress.  Remains dysarthric. HEENT:atraumatic, normocephalic Chest: B/L clear to auscultation anteriorly CVS:S1S2 regular Abdomen:soft non tender, non distended Extremities:no edema Neurology: Right-sided hemiplegia remains unchanged Skin: no rash  I have personally reviewed following labs and imaging studies  LABORATORY DATA: CBC: Recent Labs  Lab 04/21/20 1749  WBC 5.1  NEUTROABS 3.7  HGB 13.0  HCT 39.0  MCV 88.8  PLT 305    Basic Metabolic Panel: Recent Labs  Lab 04/21/20 1749 04/23/20 0443 04/24/20 1141 04/25/20 0920  NA 137 139 139 140  K 4.0 3.3* 3.8 4.3  CL 102 103 102 106  CO2 19* 21* 25 24  GLUCOSE 128* 157* 145* 146*  BUN 7* 16 20 18   CREATININE 0.70 0.83 0.64 0.64  CALCIUM 9.2 9.2 9.3 9.1  MG  --   --  1.7 2.0    GFR: CrCl cannot be calculated (Unknown ideal weight.).  Liver Function Tests: Recent Labs  Lab 04/21/20 1749  AST 56*  ALT 38  ALKPHOS 79  BILITOT 1.4*  PROT 8.4*  ALBUMIN 3.8   No results for input(s): LIPASE, AMYLASE in the last 168 hours. No results for input(s): AMMONIA in the last 168 hours.  Coagulation Profile: Recent Labs  Lab 04/21/20 1749  INR 1.1    Cardiac Enzymes: No results for input(s): CKTOTAL, CKMB, CKMBINDEX, TROPONINI in the last 168 hours.  BNP (last 3  results) No results for input(s): PROBNP in the last 8760 hours.  Lipid Profile: No results for input(s): CHOL, HDL, LDLCALC, TRIG, CHOLHDL, LDLDIRECT in the last 72 hours.  Thyroid Function Tests: No results for input(s): TSH, T4TOTAL, FREET4, T3FREE, THYROIDAB in the last 72 hours.  Anemia Panel: No results for input(s): VITAMINB12, FOLATE, FERRITIN, TIBC, IRON, RETICCTPCT in the last 72 hours.  Urine analysis:    Component Value Date/Time   COLORURINE YELLOW 04/21/2020 2000   APPEARANCEUR CLEAR 04/21/2020 2000   APPEARANCEUR Clear 08/17/2014 0948   LABSPEC 1.042 (H) 04/21/2020 2000   LABSPEC 1.018 08/17/2014 0948   PHURINE 6.0 04/21/2020 2000   GLUCOSEU NEGATIVE 04/21/2020 2000   GLUCOSEU Negative 08/17/2014 0948   HGBUR NEGATIVE 04/21/2020 2000   BILIRUBINUR NEGATIVE 04/21/2020 2000   BILIRUBINUR  Negative 08/17/2014 0948   KETONESUR 20 (A) 04/21/2020 2000   PROTEINUR NEGATIVE 04/21/2020 2000   NITRITE NEGATIVE 04/21/2020 2000   LEUKOCYTESUR NEGATIVE 04/21/2020 2000   LEUKOCYTESUR Negative 08/17/2014 0948    Sepsis Labs: Lactic Acid, Venous No results found for: LATICACIDVEN  MICROBIOLOGY: Recent Results (from the past 240 hour(s))  SARS Coronavirus 2 by RT PCR (hospital order, performed in [facility] hospital lab) Nasopharyngeal Nasopharyngeal Swab     Status: None   Collection Time: 04/21/20  6:51 PM   Specimen: Nasopharyngeal Swab  Result Value Ref Range Status   SARS Coronavirus 2 NEGATIVE NEGATIVE Final    Comment: (NOTE) SARS-CoV-2 target nucleic acids are NOT DETECTED.  The SARS-CoV-2 RNA is generally detectable in upper and lower respiratory specimens during the acute phase of infection. The lowest concentration of SARS-CoV-2 viral copies this assay can detect is 250 copies / mL. A negative result does not preclude SARS-CoV-2 infection and should not be used as the sole basis for treatment or other patient management decisions.  A negative result may  occur with improper specimen collection / handling, submission of specimen other than nasopharyngeal swab, presence of viral mutation(s) within the areas targeted by this assay, and inadequate number of viral copies (<250 copies / mL). A negative result must be combined with clinical observations, patient history, and epidemiological information.  Fact Sheet for Patients:   BoilerBrush.com.cy  Fact Sheet for Healthcare Providers: https://pope.com/  This test is not yet approved or  cleared by the [place] FDA and has been authorized for detection and/or diagnosis of SARS-CoV-2 by FDA under an Emergency Use Authorization (EUA).  This EUA will remain in effect (meaning this test can be used) for the duration of the COVID-19 declaration under Section 564(b)(1) of the Act, 21 U.S.C. section 360bbb-3(b)(1), unless the authorization is terminated or revoked sooner.  Performed at [facility], [address], [place], [place] [address]   Culture, blood (Routine X 2) w Reflex to ID Panel     Status: None (Preliminary result)   Collection Time: 04/21/20  8:25 PM   Specimen: BLOOD RIGHT HAND  Result Value Ref Range Status   Specimen Description BLOOD RIGHT HAND  Final   Special Requests   Final    BOTTLES DRAWN AEROBIC AND ANAEROBIC Blood Culture adequate volume   Culture   Final    NO GROWTH 4 DAYS Performed at [facility], [address] [place], [place] [address]    Report Status PENDING  Incomplete  MRSA PCR Screening     Status: None   Collection Time: 04/22/20  1:17 AM   Specimen: Nasal Mucosa; Nasopharyngeal  Result Value Ref Range Status   MRSA by PCR NEGATIVE NEGATIVE Final    Comment:        The GeneXpert MRSA Assay (FDA approved for NASAL specimens only), is one component of a comprehensive MRSA colonization surveillance program. It is not intended to diagnose MRSA infection nor to guide or monitor  treatment for MRSA infections. Performed at [facility], [address], [place], [place] [address]     RADIOLOGY STUDIES/RESULTS: CT HEAD WO CONTRAST  Result Date: 04/24/2020 CLINICAL DATA:  Stroke follow-up. EXAM: CT HEAD WITHOUT CONTRAST TECHNIQUE: Contiguous axial images were obtained from the base of the skull through the vertex without intravenous contrast. COMPARISON:  04/22/2020 head CT and 04/21/2020 MRI FINDINGS: Brain: A moderately large left MCA infarct demonstrates further evolution with increasing cytotoxic edema  and increased rightward midline shift which now measures 3 mm. There is partial effacement of the left lateral ventricle. Multiple foci of hemorrhage are again seen within the infarct with some appearing more well-defined and perhaps slightly larger compared to the prior CT, however no substantial enlargement is evident compared to the prior MRI. No new infarct or extra-axial fluid collection is identified. There is no evidence of hydrocephalus. A background of mild chronic small vessel ischemia is again noted in the cerebral white matter. Vascular: Calcified atherosclerosis at the skull base. Skull: No fracture or suspicious osseous lesion. Sinuses/Orbits: Opacification of a single right ethmoid air cell. Clear mastoid air cells. Unremarkable orbits. Other: None. IMPRESSION: 1. Evolving left MCA infarct with increasing edema and increased rightward midline shift, now 3 mm. 2. Small hemorrhages within the infarct without significant progression. Electronically Signed   By: Sebastian AcheAllen  Grady M.D.   On: 04/24/2020 06:46     LOS: 4 days   Jeoffrey MassedShanker Liller Yohn, MD  [facility]    To contact the attending provider between 7A-7P or the covering provider during after hours 7P-7A, please log into the web site [email or web address] and access using universal [place] password for that web site. If you do not have the password, please call the hospital operator.  04/25/2020, 1:33  PM

## 2020-04-25 NOTE — Significant Event (Addendum)
CRITICAL VALUE ALERT  Critical Value:  Blood Glucose 416  Date & Time Notied:  04/25/20 @ 2008  Provider Notified: B. Chotiner, MD  Orders Received/Actions taken: New orders received at 04/25/20 @ 2013. See MAR for details.

## 2020-04-25 NOTE — Progress Notes (Signed)
Progress Note  Patient Name: Lindsey [name] Date of Encounter: 04/25/2020  Primary Cardiologist: No primary care provider on file.   Subjective   Sitting up eating with her daughter present. No concerns.  Inpatient Medications    Scheduled Meds: . aspirin  300 mg Rectal Daily   Or  . aspirin  81 mg Per Tube Daily  . atorvastatin  40 mg Per Tube q1800  . chlorhexidine  15 mL Mouth Rinse BID  . enoxaparin (LOVENOX) injection  40 mg Subcutaneous Q24H  . feeding supplement (OSMOLITE 1.5 CAL)  1,000 mL Per Tube Q24H  . feeding supplement (PROSource TF)  45 mL Per Tube BID  . mouth rinse  15 mL Mouth Rinse q12n4p  . metoprolol tartrate  25 mg Per Tube BID   Continuous Infusions:  PRN Meds: acetaminophen **OR** acetaminophen (TYLENOL) oral liquid 160 mg/5 mL **OR** acetaminophen, Resource ThickenUp Clear   Vital Signs    Vitals:   04/24/20 1925 04/24/20 2321 04/25/20 0331 04/25/20 0434  BP: (!) 134/93 (!) 147/73 (!) 146/98   Pulse: 104 86 100 90  Resp: 20 19 17 14   Temp: 99.4 F (37.4 C) 99.5 F (37.5 C) 99.5 F (37.5 C)   TempSrc: Axillary Axillary Axillary   SpO2: 98% 99% 98% 97%  Weight:    76.7 kg    Intake/Output Summary (Last 24 hours) at 04/25/2020 04/27/2020 Last data filed at 04/25/2020 0000 Gross per 24 hour  Intake 2859.17 ml  Output 350 ml  Net 2509.17 ml   Filed Weights   04/22/20 0100 04/25/20 0434  Weight: 75.8 kg 76.7 kg    Telemetry    afib rates 90-110  - Personally Reviewed  ECG    No new - Personally Reviewed  Physical Exam   GEN: No acute distress.   Neck: No JVD Cardiac: iRRR, no murmurs, rubs, or gallops.  Respiratory: Clear to auscultation bilaterally. GI: Soft, nontender, non-distended  MS: No edema; No deformity. Neuro:  R hemiparesis Psych: Normal affect - unable to verbalize clearly  Labs    Chemistry Recent Labs  Lab 04/21/20 1749 04/23/20 0443 04/24/20 1141  NA 137 139 139  K 4.0 3.3* 3.8  CL 102 103 102  CO2 19*  21* 25  GLUCOSE 128* 157* 145*  BUN 7* 16 20  CREATININE 0.70 0.83 0.64  CALCIUM 9.2 9.2 9.3  PROT 8.4*  --   --   ALBUMIN 3.8  --   --   AST 56*  --   --   ALT 38  --   --   ALKPHOS 79  --   --   BILITOT 1.4*  --   --   GFRNONAA >60 >60 >60  GFRAA >60 >60 >60  ANIONGAP 16* 15 12     Hematology Recent Labs  Lab 04/21/20 1749  WBC 5.1  RBC 4.39  HGB 13.0  HCT 39.0  MCV 88.8  MCH 29.6  MCHC 33.3  RDW 14.8  PLT 305    Cardiac EnzymesNo results for input(s): TROPONINI in the last 168 hours. No results for input(s): TROPIPOC in the last 168 hours.   BNPNo results for input(s): BNP, PROBNP in the last 168 hours.   DDimer No results for input(s): DDIMER in the last 168 hours.   Radiology    CT HEAD WO CONTRAST  Result Date: 04/24/2020 CLINICAL DATA:  Stroke follow-up. EXAM: CT HEAD WITHOUT CONTRAST TECHNIQUE: Contiguous axial images were obtained from the base of the  skull through the vertex without intravenous contrast. COMPARISON:  04/22/2020 head CT and 04/21/2020 MRI FINDINGS: Brain: A moderately large left MCA infarct demonstrates further evolution with increasing cytotoxic edema and increased rightward midline shift which now measures 3 mm. There is partial effacement of the left lateral ventricle. Multiple foci of hemorrhage are again seen within the infarct with some appearing more well-defined and perhaps slightly larger compared to the prior CT, however no substantial enlargement is evident compared to the prior MRI. No new infarct or extra-axial fluid collection is identified. There is no evidence of hydrocephalus. A background of mild chronic small vessel ischemia is again noted in the cerebral white matter. Vascular: Calcified atherosclerosis at the skull base. Skull: No fracture or suspicious osseous lesion. Sinuses/Orbits: Opacification of a single right ethmoid air cell. Clear mastoid air cells. Unremarkable orbits. Other: None. IMPRESSION: 1. Evolving left MCA  infarct with increasing edema and increased rightward midline shift, now 3 mm. 2. Small hemorrhages within the infarct without significant progression. Electronically Signed   By: Sebastian Ache M.D.   On: 04/24/2020 06:46    Cardiac Studies    Patient Profile       Assessment & Plan   Principal Problem:   CVA (cerebral vascular accident) [facility]) Active Problems:   Unspecified atrial fibrillation (HCC)   Essential hypertension   Alcohol abuse   1. AFIB RVR - continue metoprolol per tube, uptitrate as able with permissive HTN. Rates well controlled currently. - defer decision about anticoagulation to neurology, not appropriate for [facility] currently, CHADSVASC 5 (female, HTN, DM, stroke x 2) - avoiding rhythm control strategy currently due to inability to use AC.  2. ACUTE CVA - L MCA with hemorrhagic conversion and midline shift -atorvastatin - per neurology - aspirin added by neurology  3. HTN - permissive HTN per neurology  4. HLD - statin  5. Prediabetes      For questions or updates, please contact [facility] Please consult [email or web address] for contact info under        Signed, Parke Poisson, MD

## 2020-04-26 LAB — CULTURE, BLOOD (ROUTINE X 2)
Culture: NO GROWTH
Special Requests: ADEQUATE

## 2020-04-26 LAB — GLUCOSE, CAPILLARY
Glucose-Capillary: 105 mg/dL — ABNORMAL HIGH (ref 70–99)
Glucose-Capillary: 126 mg/dL — ABNORMAL HIGH (ref 70–99)
Glucose-Capillary: 135 mg/dL — ABNORMAL HIGH (ref 70–99)
Glucose-Capillary: 137 mg/dL — ABNORMAL HIGH (ref 70–99)
Glucose-Capillary: 138 mg/dL — ABNORMAL HIGH (ref 70–99)
Glucose-Capillary: 153 mg/dL — ABNORMAL HIGH (ref 70–99)

## 2020-04-26 MED ORDER — METOPROLOL TARTRATE 25 MG PO TABS
25.0000 mg | ORAL_TABLET | Freq: Three times a day (TID) | ORAL | Status: DC
  Administered 2020-04-26 – 2020-04-27 (×2): 25 mg
  Filled 2020-04-26 (×2): qty 1

## 2020-04-26 NOTE — Progress Notes (Signed)
   04/26/20 1112  Assess: MEWS Score  Temp 98.2 F (36.8 C)  BP (!) 142/93  Pulse Rate (!) 140  ECG Heart Rate (!) 133  Resp (!) 27  SpO2 100 %  Assess: MEWS Score  MEWS Temp 0  MEWS Systolic 0  MEWS Pulse 3  MEWS RR 2  MEWS LOC 0  MEWS Score 5  MEWS Score Color Red  Assess: if the MEWS score is Yellow or Red  Were vital signs taken at a resting state? Yes  Focused Assessment No change from prior assessment  Early Detection of Sepsis Score *See Row Information* Medium  MEWS guidelines implemented *See Row Information* Yes  Take Vital Signs  Increase Vital Sign Frequency  Red: Q 1hr X 4 then Q 4hr X 4, if remains red, continue Q 4hrs  Escalate  MEWS: Escalate Red: discuss with charge nurse/RN and provider, consider discussing with RRT  Notify: Charge Nurse/RN  Name of Charge Nurse/RN Notified Rodman Pickle   Date Charge Nurse/RN Notified 04/26/20  Time Charge Nurse/RN Notified 1112

## 2020-04-26 NOTE — Progress Notes (Signed)
PROGRESS NOTE        PATIENT DETAILS Name: Lindsey [name] Age: 64 y.o. Sex: female Date of Birth: 11-04-1955 Admit Date: 04/21/2020 Admitting Physician Anselm Junglinghing T Tu, DO ZOX:[id number]:Aycock, Ngwe A, MD  Brief Narrative: Patient is a 64 y.o. female with history of HTN, EtOH use-who presented with right-sided hemiparesis, dysarthria-found to have acute CVA and A. fib with RVR.  Significant events: 7/20>> admit for acute CVA and A. fib with RVR  Significant studies: 7/22>> CT head: Evolving left MCA infarct with increasing edema and increased rightward shift, small hemorrhages within the infarct without significant progression. 7/21>> LDL: 127 7/21>> A1c: 6.1 7/21>> echo: Pending 7/21>> CT head: Evolving left MCA infarct with unchanged small areas of hemorrhage. 7/20>> MRI brain: Acute infarct left MCA territory-mild hemorrhage in the infarct 7/20>> CTA head/neck: Acute occlusion of the inferior division of left M2, no other intracranial/extracranial stenosis.  Antimicrobial therapy: None  Microbiology data: 7/20>> blood culture: No growth  Procedures : None  Consults: Neurology  DVT Prophylaxis : enoxaparin (LOVENOX) injection 40 mg Start: 04/22/20 1100  Subjective: Seen and examined-speech seems minimally improved from past few days-continues to have dense right-sided hemiplegia.  Following commands.  Not in any distress.  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 fib.  See work-up results above.  Neurology following-continue ASA for now-repeat CT head on 7/23-reviewed with Dr. Bascom LevelsSethi-he recommends that we reach out to the stroke team 1 week from 7/23-before proceeding with anticoagulation.   Atrial fibrillation with RVR: Although rate better controlled-continues to have brief episodes of A. fib with RVR-change metoprolol to daily  dosing.  Cardiology following.  Not a candidate for anticoagulation due to hemorrhagic transformation of CVA.    Dysphagia: Secondary to CVA-NG tube in place-seems to be tolerating dysphagia 1 diet.  Appreciate SLP eval.  Will reassess over the next few days whether we can discontinue NG tube.  Per nursing staff-tolerating dysphagia 1 diet very well.  HTN: BP stable-continue metoprolol.  HLD: Continue statin  Hypokalemia: Repleted  Hypomagnesemia: Repleted  EtOH abuse: Per daughter-patient does not drink every day-apparently has gone several days without withdrawal symptoms when not drinking.  Watch closely for withdrawal symptoms-currently none.  Diet: Diet Order            DIET - DYS 1 Room service appropriate? No; Fluid consistency: Nectar Thick  Diet effective now                  Code Status: Full code   Family Communication: Daughter over the phone on 7/24-we will update over the next few days-her condition appears unchanged compared to yesterday.  Disposition Plan: Status is: Inpatient  Remains inpatient appropriate because:Inpatient level of care appropriate due to severity of illness   Dispo: The patient is from: Home              Anticipated d/c is to: CIR              Anticipated d/c date is: 2 days              Patient currently is not medically stable to d/c.  Barriers to Discharge: Acute CVA with dense right-sided hemiplegia/dysphagia-work-up in progress  Antimicrobial agents: Anti-infectives (From admission, onward)   None  Time spent: 25 minutes-Greater than 50% of this time was spent in counseling, explanation of diagnosis, planning of further management, and coordination of care.  MEDICATIONS: Scheduled Meds: . aspirin  300 mg Rectal Daily   Or  . aspirin  81 mg Per Tube Daily  . atorvastatin  40 mg Per Tube q1800  . chlorhexidine  15 mL Mouth Rinse BID  . enoxaparin (LOVENOX) injection  40 mg Subcutaneous Q24H  . feeding supplement  (OSMOLITE 1.5 CAL)  1,000 mL Per Tube Q24H  . feeding supplement (PROSource TF)  45 mL Per Tube BID  . mouth rinse  15 mL Mouth Rinse q12n4p  . metoprolol tartrate  25 mg Per Tube BID   Continuous Infusions: PRN Meds:.acetaminophen **OR** acetaminophen (TYLENOL) oral liquid 160 mg/5 mL **OR** acetaminophen, Resource ThickenUp Clear   PHYSICAL EXAM: Vital signs: Vitals:   04/26/20 1112 04/26/20 1230 04/26/20 1300 04/26/20 1400  BP: (!) 142/93 (!) 124/86 (!) 124/95 (!) 136/99  Pulse: (!) 140 99 94 90  Resp: (!) 27 19 23 18   Temp: 98.2 F (36.8 C) 99 F (37.2 C) 98.9 F (37.2 C) 98.5 F (36.9 C)  TempSrc: Oral Oral    SpO2: 100% 98% 99% 100%  Weight:       Filed Weights   04/22/20 0100 04/25/20 0434 04/26/20 0429  Weight: 75.8 kg 76.7 kg 83.4 kg   Body mass index is 30.6 kg/m.   Gen Exam:Alert awake-not in any distress HEENT:atraumatic, normocephalic Chest: B/L clear to auscultation anteriorly CVS:S1S2 regular Abdomen:soft non tender, non distended Extremities:no edema Neurology: Dense right-sided hemiplegia remains unchanged-dysarthria remains unchanged. Skin: no rash  I have personally reviewed following labs and imaging studies  LABORATORY DATA: CBC: Recent Labs  Lab 04/21/20 1749  WBC 5.1  NEUTROABS 3.7  HGB 13.0  HCT 39.0  MCV 88.8  PLT 305    Basic Metabolic Panel: Recent Labs  Lab 04/21/20 1749 04/23/20 0443 04/24/20 1141 04/25/20 0920  NA 137 139 139 140  K 4.0 3.3* 3.8 4.3  CL 102 103 102 106  CO2 19* 21* 25 24  GLUCOSE 128* 157* 145* 146*  BUN 7* 16 20 18   CREATININE 0.70 0.83 0.64 0.64  CALCIUM 9.2 9.2 9.3 9.1  MG  --   --  1.7 2.0    GFR: CrCl cannot be calculated (Unknown ideal weight.).  Liver Function Tests: Recent Labs  Lab 04/21/20 1749  AST 56*  ALT 38  ALKPHOS 79  BILITOT 1.4*  PROT 8.4*  ALBUMIN 3.8   No results for input(s): LIPASE, AMYLASE in the last 168 hours. No results for input(s): AMMONIA in the last 168  hours.  Coagulation Profile: Recent Labs  Lab 04/21/20 1749  INR 1.1    Cardiac Enzymes: No results for input(s): CKTOTAL, CKMB, CKMBINDEX, TROPONINI in the last 168 hours.  BNP (last 3 results) No results for input(s): PROBNP in the last 8760 hours.  Lipid Profile: No results for input(s): CHOL, HDL, LDLCALC, TRIG, CHOLHDL, LDLDIRECT in the last 72 hours.  Thyroid Function Tests: No results for input(s): TSH, T4TOTAL, FREET4, T3FREE, THYROIDAB in the last 72 hours.  Anemia Panel: No results for input(s): VITAMINB12, FOLATE, FERRITIN, TIBC, IRON, RETICCTPCT in the last 72 hours.  Urine analysis:    Component Value Date/Time   COLORURINE YELLOW 04/21/2020 2000   APPEARANCEUR CLEAR 04/21/2020 2000   APPEARANCEUR Clear 08/17/2014 0948   LABSPEC 1.042 (H) 04/21/2020 2000   LABSPEC 1.018 08/17/2014 0948   PHURINE  6.0 04/21/2020 2000   GLUCOSEU NEGATIVE 04/21/2020 2000   GLUCOSEU Negative 08/17/2014 0948   HGBUR NEGATIVE 04/21/2020 2000   BILIRUBINUR NEGATIVE 04/21/2020 2000   BILIRUBINUR Negative 08/17/2014 0948   KETONESUR 20 (A) 04/21/2020 2000   PROTEINUR NEGATIVE 04/21/2020 2000   NITRITE NEGATIVE 04/21/2020 2000   LEUKOCYTESUR NEGATIVE 04/21/2020 2000   LEUKOCYTESUR Negative 08/17/2014 0948    Sepsis Labs: Lactic Acid, Venous No results found for: LATICACIDVEN  MICROBIOLOGY: Recent Results (from the past 240 hour(s))  SARS Coronavirus 2 by RT PCR (hospital order, performed in [facility] hospital lab) Nasopharyngeal Nasopharyngeal Swab     Status: None   Collection Time: 04/21/20  6:51 PM   Specimen: Nasopharyngeal Swab  Result Value Ref Range Status   SARS Coronavirus 2 NEGATIVE NEGATIVE Final    Comment: (NOTE) SARS-CoV-2 target nucleic acids are NOT DETECTED.  The SARS-CoV-2 RNA is generally detectable in upper and lower respiratory specimens during the acute phase of infection. The lowest concentration of SARS-CoV-2 viral copies this assay can detect  is 250 copies / mL. A negative result does not preclude SARS-CoV-2 infection and should not be used as the sole basis for treatment or other patient management decisions.  A negative result may occur with improper specimen collection / handling, submission of specimen other than nasopharyngeal swab, presence of viral mutation(s) within the areas targeted by this assay, and inadequate number of viral copies (<250 copies / mL). A negative result must be combined with clinical observations, patient history, and epidemiological information.  Fact Sheet for Patients:   BoilerBrush.com.cy  Fact Sheet for Healthcare Providers: https://pope.com/  This test is not yet approved or  cleared by the [place] FDA and has been authorized for detection and/or diagnosis of SARS-CoV-2 by FDA under an Emergency Use Authorization (EUA).  This EUA will remain in effect (meaning this test can be used) for the duration of the COVID-19 declaration under Section 564(b)(1) of the Act, 21 U.S.C. section 360bbb-3(b)(1), unless the authorization is terminated or revoked sooner.  Performed at [facility], [address] [place], [place] [address]   Culture, blood (Routine X 2) w Reflex to ID Panel     Status: None   Collection Time: 04/21/20  8:25 PM   Specimen: BLOOD RIGHT HAND  Result Value Ref Range Status   Specimen Description BLOOD RIGHT HAND  Final   Special Requests   Final    BOTTLES DRAWN AEROBIC AND ANAEROBIC Blood Culture adequate volume   Culture   Final    NO GROWTH 5 DAYS Performed at [facility], [address], [place], [place] [address]    Report Status 04/26/2020 FINAL  Final  MRSA PCR Screening     Status: None   Collection Time: 04/22/20  1:17 AM   Specimen: Nasal Mucosa; Nasopharyngeal  Result Value Ref Range Status   MRSA by PCR NEGATIVE NEGATIVE Final    Comment:        The GeneXpert MRSA Assay (FDA approved  for NASAL specimens only), is one component of a comprehensive MRSA colonization surveillance program. It is not intended to diagnose MRSA infection nor to guide or monitor treatment for MRSA infections. Performed at [facility], [address] [place], [place] [address]     RADIOLOGY STUDIES/RESULTS: No results found.   LOS: 5 days   Jeoffrey Massed, MD  [facility]    To contact the attending provider between 7A-7P or the covering provider  during after hours 7P-7A, please log into the web site [email or web address] and access using universal [place] password for that web site. If you do not have the password, please call the hospital operator.  04/26/2020, 2:11 PM

## 2020-04-27 DIAGNOSIS — I4819 Other persistent atrial fibrillation: Secondary | ICD-10-CM

## 2020-04-27 DIAGNOSIS — F1010 Alcohol abuse, uncomplicated: Secondary | ICD-10-CM

## 2020-04-27 DIAGNOSIS — I10 Essential (primary) hypertension: Secondary | ICD-10-CM

## 2020-04-27 DIAGNOSIS — I63412 Cerebral infarction due to embolism of left middle cerebral artery: Principal | ICD-10-CM

## 2020-04-27 LAB — CBC
HCT: 37.1 % (ref 36.0–46.0)
Hemoglobin: 12.3 g/dL (ref 12.0–15.0)
MCH: 29.6 pg (ref 26.0–34.0)
MCHC: 33.2 g/dL (ref 30.0–36.0)
MCV: 89.2 fL (ref 80.0–100.0)
Platelets: 213 10*3/uL (ref 150–400)
RBC: 4.16 MIL/uL (ref 3.87–5.11)
RDW: 14.6 % (ref 11.5–15.5)
WBC: 5.3 10*3/uL (ref 4.0–10.5)
nRBC: 0 % (ref 0.0–0.2)

## 2020-04-27 LAB — BASIC METABOLIC PANEL
Anion gap: 9 (ref 5–15)
BUN: 18 mg/dL (ref 8–23)
CO2: 26 mmol/L (ref 22–32)
Calcium: 9.2 mg/dL (ref 8.9–10.3)
Chloride: 101 mmol/L (ref 98–111)
Creatinine, Ser: 0.62 mg/dL (ref 0.44–1.00)
GFR calc Af Amer: >60 mL/min (ref >60)
GFR calc non Af Amer: >60 mL/min (ref >60)
Glucose, Bld: 146 mg/dL — ABNORMAL HIGH (ref 70–99)
Potassium: 4.5 mmol/L (ref 3.5–5.1)
Sodium: 136 mmol/L (ref 135–145)

## 2020-04-27 LAB — GLUCOSE, CAPILLARY
Glucose-Capillary: 137 mg/dL — ABNORMAL HIGH (ref 70–99)
Glucose-Capillary: 138 mg/dL — ABNORMAL HIGH (ref 70–99)
Glucose-Capillary: 150 mg/dL — ABNORMAL HIGH (ref 70–99)
Glucose-Capillary: 151 mg/dL — ABNORMAL HIGH (ref 70–99)
Glucose-Capillary: 159 mg/dL — ABNORMAL HIGH (ref 70–99)
Glucose-Capillary: 165 mg/dL — ABNORMAL HIGH (ref 70–99)

## 2020-04-27 LAB — MAGNESIUM: Magnesium: 1.8 mg/dL (ref 1.7–2.4)

## 2020-04-27 MED ORDER — METOPROLOL TARTRATE 25 MG PO TABS
37.5000 mg | ORAL_TABLET | Freq: Three times a day (TID) | ORAL | Status: DC
  Administered 2020-04-27 – 2020-04-28 (×3): 37.5 mg
  Filled 2020-04-27 (×3): qty 1

## 2020-04-27 MED ORDER — MAGNESIUM SULFATE IN D5W 1-5 GM/100ML-% IV SOLN
1.0000 g | Freq: Once | INTRAVENOUS | Status: AC
  Administered 2020-04-27: 1 g via INTRAVENOUS
  Filled 2020-04-27: qty 100

## 2020-04-27 NOTE — Plan of Care (Signed)
  Problem: Education: Goal: Knowledge of disease or condition will improve Outcome: Not Progressing Goal: Knowledge of secondary prevention will improve Outcome: Not Progressing Goal: Knowledge of patient specific risk factors addressed and post discharge goals established will improve Outcome: Not Progressing Goal: Individualized Educational Video(s) Outcome: Not Progressing   Problem: Coping: Goal: Will verbalize positive feelings about self Outcome: Not Progressing Goal: Will identify appropriate support needs Outcome: Not Progressing   Problem: Self-Care: Goal: Verbalization of feelings and concerns over difficulty with self-care will improve Outcome: Not Progressing Goal: Ability to communicate needs accurately will improve Outcome: Not Progressing   Problem: Intracerebral Hemorrhage Tissue Perfusion: Goal: Complications of Intracerebral Hemorrhage will be minimized Outcome: Not Progressing   Problem: Education: Goal: Knowledge of General Education information will improve Description: Including pain rating scale, medication(s)/side effects and non-pharmacologic comfort measures Outcome: Not Progressing   Problem: Health Behavior/Discharge Planning: Goal: Ability to manage health-related needs will improve Outcome: Not Progressing   Problem: Clinical Measurements: Goal: Ability to maintain clinical measurements within normal limits will improve Outcome: Not Progressing Goal: Will remain free from infection Outcome: Not Progressing Goal: Diagnostic test results will improve Outcome: Not Progressing Goal: Respiratory complications will improve Outcome: Not Progressing Goal: Cardiovascular complication will be avoided Outcome: Not Progressing   Problem: Activity: Goal: Risk for activity intolerance will decrease Outcome: Not Progressing   Problem: Nutrition: Goal: Adequate nutrition will be maintained Outcome: Not Progressing   Problem: Pain Managment: Goal:  General experience of comfort will improve Outcome: Not Progressing   Problem: Safety: Goal: Ability to remain free from injury will improve Outcome: Not Progressing   Problem: Skin Integrity: Goal: Risk for impaired skin integrity will decrease Outcome: Not Progressing

## 2020-04-27 NOTE — Progress Notes (Signed)
Occupational Therapy Treatment Patient Details Name: Zamiya Dillard MRN: [id number] DOB: 07-27-1956 Today's Date: 04/27/2020    History of present illness Pt is a 64 y.o. female admitted 04/21/20 with R weakness, facial droop, and severe dysarthria. CT and MRI brain reveal large L MCA, initially ischemic but with some hemorrhagic conversion. CT scan 7/23 shows evolving large L MCA infarct with stable hemorrhagic transformation but increasing edema and 3 mm left-to-right shift. PMH includes HTN, afib with RVR.   OT comments  Patient is showing steady improvement.  Eye control has improved, patient able to cross midline to R about 45 degrees.  Patient is also gaining sensation to R side.  Worked on scanning and visually finding objects to R.  Able to sit EOB today for ~12 min with min guard-max assist for balance, emphasizing work on core strength.  Stood for first time with max assist x2 and stedy.  Required total assist for toileting and hygiene.  Patient would continue to benefit from OT to work on improving coordination, motor planning, strength, vision, and functional mobility.  Will continue to follow with OT acutely.    Follow Up Recommendations  CIR;Supervision/Assistance - 24 hour    Equipment Recommendations  Other (comment)    Recommendations for Other Services      Precautions / Restrictions Precautions Precautions: Fall Precaution Comments: R neglect, R hemi Restrictions Weight Bearing Restrictions: No       Mobility Bed Mobility                  Transfers Overall transfer level: Needs assistance   Transfers: Sit to/from Stand Sit to Stand: Mod assist;+2 physical assistance         General transfer comment: Stood once from bed and 3 times from stedy.  Heavy R side lean requiring consistent support    Balance Overall balance assessment: Needs assistance Sitting-balance support: Feet supported;Single extremity supported Sitting balance-Leahy Scale:  Poor Sitting balance - Comments: Assist ranging from close min guard to max assist.  Postural control: Right lateral lean Standing balance support: Single extremity supported Standing balance-Leahy Scale: Poor Standing balance comment: Using stedy and assist of 2                           ADL either performed or assessed with clinical judgement   ADL Overall ADL's : Needs assistance/impaired     Grooming: Wash/dry face;Set up;Sitting Grooming Details (indicate cue type and reason): Using L hand, sitting EOB. Sitting required min-max support                      Toileting- Clothing Manipulation and Hygiene: Total assistance;Sit to/from stand Toileting - Clothing Manipulation Details (indicate cue type and reason): Stood using stedy and support from PT.     Functional mobility during ADLs: Moderate assistance;Maximal assistance;+2 for physical assistance       Vision   Vision Assessment?: Vision impaired- to be further tested in functional context Additional Comments: Crossing midline to R has improved to about 45 degrees   Perception     Praxis      Cognition Arousal/Alertness: Awake/alert Behavior During Therapy: Flat affect Overall Cognitive Status: Difficult to assess Area of Impairment: Following commands;Safety/judgement;Awareness;Problem solving                       Following Commands: Follows one step commands inconsistently;Follows one step commands with increased time Safety/Judgement: Decreased awareness  of deficits;Decreased awareness of safety Awareness: Intellectual Problem Solving: Slow processing;Difficulty sequencing;Decreased initiation;Requires verbal cues;Requires tactile cues General Comments: Patient's cognition is improving.  Initiation is more consistent, as well as following ocmmands.  Still having motor planning and imitation difficulty.  When ask to point L index finger patient unable, waving fingers instead or putting  index finger down.        Exercises Exercises: Other exercises Other Exercises Other Exercises: Stood x4 Other Exercises: Sat EOB ~12 min   Shoulder Instructions       General Comments HR increased to 129 by time of standing as patient stood many times after sitting EOB ~12 min.    Pertinent Vitals/ Pain       Pain Assessment: Faces Faces Pain Scale: No hurt  Home Living                                          Prior Functioning/Environment              Frequency  Min 2X/week        Progress Toward Goals  OT Goals(current goals can now be found in the care plan section)  Progress towards OT goals: Progressing toward goals  Acute Rehab OT Goals Patient Stated Goal: No goal stated but wanting to do better OT Goal Formulation: With patient/family Time For Goal Achievement: 05/06/20 Potential to Achieve Goals: Good  Plan Discharge plan remains appropriate    Co-evaluation    PT/OT/SLP Co-Evaluation/Treatment: Yes Reason for Co-Treatment: Complexity of the patient's impairments (multi-system involvement);For patient/therapist safety;To address functional/ADL transfers   OT goals addressed during session: ADL's and self-care;Strengthening/ROM      AM-PAC OT "6 Clicks" Daily Activity     Outcome Measure   Help from another person eating meals?: Total Help from another person taking care of personal grooming?: A Lot Help from another person toileting, which includes using toliet, bedpan, or urinal?: Total Help from another person bathing (including washing, rinsing, drying)?: Total Help from another person to put on and taking off regular upper body clothing?: A Lot Help from another person to put on and taking off regular lower body clothing?: Total 6 Click Score: 8    End of Session Equipment Utilized During Treatment: Gait belt  OT Visit Diagnosis: Unsteadiness on feet (R26.81);Other abnormalities of gait and mobility (R26.89);Muscle  weakness (generalized) (M62.81);Other symptoms and signs involving the nervous system (R29.898);Other symptoms and signs involving cognitive function;Feeding difficulties (R63.3);Hemiplegia and hemiparesis;Cognitive communication deficit (R41.841) Symptoms and signs involving cognitive functions: Cerebral infarction Hemiplegia - Right/Left: Right Hemiplegia - dominant/non-dominant: Dominant Hemiplegia - caused by: Cerebral infarction   Activity Tolerance Patient tolerated treatment well   Patient Left in bed;with call bell/phone within reach;with bed alarm set;with family/visitor present   Nurse Communication Mobility status        Time: 0867-6195 OT Time Calculation (min): 27 min  Charges: OT General Charges $OT Visit: 1 Visit OT Treatments $Self Care/Home Management : 8-22 mins  Barbie Banner, OTR/L    Adella Hare 04/27/2020, 1:38 PM

## 2020-04-27 NOTE — Progress Notes (Signed)
Progress Note  Patient Name: Lindsey [name] Date of Encounter: 04/27/2020  [facility] Cardiologist: No primary care provider on file. New-Acharya  Subjective   No CV complaints. Specifically denies dyspnea and angina and is unaware of the arrhythmia. Ventricular rate control remains inadequate (100-120 this AM). No problems with hypotension. Making progress with swallowing.  Inpatient Medications    Scheduled Meds: . aspirin  300 mg Rectal Daily   Or  . aspirin  81 mg Per Tube Daily  . atorvastatin  40 mg Per Tube q1800  . chlorhexidine  15 mL Mouth Rinse BID  . enoxaparin (LOVENOX) injection  40 mg Subcutaneous Q24H  . feeding supplement (OSMOLITE 1.5 CAL)  1,000 mL Per Tube Q24H  . feeding supplement (PROSource TF)  45 mL Per Tube BID  . mouth rinse  15 mL Mouth Rinse q12n4p  . metoprolol tartrate  25 mg Per Tube TID   Continuous Infusions: . magnesium sulfate bolus IVPB     PRN Meds: acetaminophen **OR** acetaminophen (TYLENOL) oral liquid 160 mg/5 mL **OR** acetaminophen, Resource ThickenUp Clear   Vital Signs    Vitals:   04/27/20 0400 04/27/20 0435 04/27/20 0440 04/27/20 0726  BP: (!) 137/106 (!) 111/90  124/84  Pulse: 105 99  85  Resp: 23 21  19   Temp: 98.2 F (36.8 C) 98.1 F (36.7 C)  98.3 F (36.8 C)  TempSrc: Oral Oral  Axillary  SpO2: 100% 97%    Weight:   81.7 kg     Intake/Output Summary (Last 24 hours) at 04/27/2020 1013 Last data filed at 04/27/2020 0600 Gross per 24 hour  Intake --  Output 600 ml  Net -600 ml   Last 3 Weights 04/27/2020 04/26/2020 04/25/2020  Weight (lbs) 180 lb 1.9 oz 183 lb 13.8 oz 169 lb 1.5 oz  Weight (kg) 81.7 kg 83.4 kg 76.7 kg      Telemetry    AF with RVR - Personally Reviewed  ECG    No new tracing - Personally Reviewed  Physical Exam  Feeding tube in place. Looks comfortable lying in bed GEN: No acute distress.   Neck: No JVD Cardiac: irregular, no murmurs, rubs, or gallops.  Respiratory: Clear to  auscultation bilaterally. GI: Soft, nontender, non-distended  MS: No edema; No deformity. Neuro:  R hemiparesis Psych: Normal affect   Labs    High Sensitivity Troponin:  No results for input(s): TROPONINIHS in the last 720 hours.    Chemistry Recent Labs  Lab 04/21/20 1749 04/23/20 0443 04/24/20 1141 04/25/20 0920 04/27/20 0357  NA 137   < > 139 140 136  K 4.0   < > 3.8 4.3 4.5  CL 102   < > 102 106 101  CO2 19*   < > 25 24 26   GLUCOSE 128*   < > 145* 146* 146*  BUN 7*   < > 20 18 18   CREATININE 0.70   < > 0.64 0.64 0.62  CALCIUM 9.2   < > 9.3 9.1 9.2  PROT 8.4*  --   --   --   --   ALBUMIN 3.8  --   --   --   --   AST 56*  --   --   --   --   ALT 38  --   --   --   --   ALKPHOS 79  --   --   --   --   BILITOT 1.4*  --   --   --   --  GFRNONAA >60   < > >60 >60 >60  GFRAA >60   < > >60 >60 >60  ANIONGAP 16*   < > 12 10 9    < > = values in this interval not displayed.     Hematology Recent Labs  Lab 04/21/20 1749 04/27/20 0357  WBC 5.1 5.3  RBC 4.39 4.16  HGB 13.0 12.3  HCT 39.0 37.1  MCV 88.8 89.2  MCH 29.6 29.6  MCHC 33.3 33.2  RDW 14.8 14.6  PLT 305 213    BNPNo results for input(s): BNP, PROBNP in the last 168 hours.   DDimer No results for input(s): DDIMER in the last 168 hours.   Radiology    No results found.  Cardiac Studies   ECHO 04/22/2020  1. Left ventricular ejection fraction, by estimation, is 60 to 65%. The  left ventricle has normal function. The left ventricle has no regional  wall motion abnormalities. There is mild left ventricular hypertrophy.  Left ventricular diastolic parameters  are indeterminate.  2. Right ventricular systolic function is normal. The right ventricular  size is normal. There is normal pulmonary artery systolic pressure. The  estimated right ventricular systolic pressure is 27.6 mmHg.  3. Left atrial size was mild to moderately dilated.  4. Right atrial size was mildly dilated.  5. The mitral  valve is normal in structure. No evidence of mitral valve  regurgitation. No evidence of mitral stenosis.  6. The aortic valve is tricuspid. Aortic valve regurgitation is not  visualized. No aortic stenosis is present.  7. The inferior vena cava is normal in size with greater than 50%  respiratory variability, suggesting right atrial pressure of 3 mmHg.   Patient Profile     64 y.o. female presenting with L MCA embolic stroke (with hemorrhagic transformation) and newly diagnosed persistent atrial fibrillation with RVR, alcohol abuse and HTN.  Assessment & Plan    No plan for starting anticoagulants until at least 7/30 per Neuro team. Needs better rate control - increase beta blocker dose today. BP should tolerate more beta blocker. On aspirin and statin.     For questions or updates, please contact [facility] Please consult [email or web address] for contact info under        Signed, 8/30, MD  04/27/2020, 10:13 AM

## 2020-04-27 NOTE — Plan of Care (Signed)
  Problem: Education: Goal: Knowledge of disease or condition will improve Outcome: Progressing Goal: Knowledge of secondary prevention will improve Outcome: Progressing Goal: Knowledge of patient specific risk factors addressed and post discharge goals established will improve Outcome: Progressing Goal: Individualized Educational Video(s) Outcome: Progressing   Problem: Education: Goal: Knowledge of General Education information will improve Description: Including pain rating scale, medication(s)/side effects and non-pharmacologic comfort measures Outcome: Progressing   Problem: Health Behavior/Discharge Planning: Goal: Ability to manage health-related needs will improve Outcome: Progressing   Problem: Clinical Measurements: Goal: Ability to maintain clinical measurements within normal limits will improve Outcome: Progressing Goal: Will remain free from infection Outcome: Progressing Goal: Diagnostic test results will improve Outcome: Progressing Goal: Respiratory complications will improve Outcome: Progressing Goal: Cardiovascular complication will be avoided Outcome: Progressing   Problem: Nutrition: Goal: Adequate nutrition will be maintained Outcome: Progressing   Problem: Coping: Goal: Level of anxiety will decrease Outcome: Progressing   Problem: Elimination: Goal: Will not experience complications related to bowel motility Outcome: Progressing Goal: Will not experience complications related to urinary retention Outcome: Progressing   Problem: Pain Managment: Goal: General experience of comfort will improve Outcome: Progressing   Problem: Safety: Goal: Ability to remain free from injury will improve Outcome: Progressing   Problem: Skin Integrity: Goal: Risk for impaired skin integrity will decrease Outcome: Progressing   Problem: Intracerebral Hemorrhage Tissue Perfusion: Goal: Complications of Intracerebral Hemorrhage will be minimized Outcome:  Progressing   Problem: Coping: Goal: Will verbalize positive feelings about self Outcome: Not Progressing Goal: Will identify appropriate support needs Outcome: Not Progressing   Problem: Self-Care: Goal: Verbalization of feelings and concerns over difficulty with self-care will improve Outcome: Not Progressing Goal: Ability to communicate needs accurately will improve Outcome: Not Progressing   Problem: Activity: Goal: Risk for activity intolerance will decrease Outcome: Not Progressing

## 2020-04-27 NOTE — Progress Notes (Signed)
°  Speech Language Pathology Treatment: Dysphagia  Patient Details Name: Lindsey [name] MRN: [id number] DOB: [birth year] Today's Date: 04/27/2020 Time: 8850-2774 SLP Time Calculation (min) (ACUTE ONLY): 11 min  Assessment / Plan / Recommendation Clinical Impression  Pt assessed with advanced textures today at MD request.  Pt has cortrak in place, but with goal of removal.  Pt expresses distaste for puree foods, and perhaps with more appetizing diet pt would consume more orally.  Today pt exhibited good tolerance of soft solid and simulated ground consistencies without overt s/s of aspiration; however, there remains significant oral stasis of solids on R side of oral cavity with lingual and buccal residue. There was trace intralabial escape of puree.  RN reports R anterior spillage of liquid by cup and with some purees.  Liquid was was not beneficial to clear residue which was removed with suction.  With thin liquid, there was immediate, prolonged cough on 1 of 2 trials.  Pt tolerated nectar thick liquid by straw with no clinical s/s of aspiration.  Pt would benefit from instrumental swallow study prior to diet advancement.  Will plan swallow study for next date as radiology does not have same-day availability at present.  Recommend continuing puree diet with nectar thick liquids.    HPI HPI: Lindsey [name] is a 64 y.o. female with past medical history significant for hypertension presents emergency department with slurred speech and right-sided weakness noted by family today. Last seen normal by her daughter was 4:30 PM yesterday.  The daughter works evening shift and prior to her leaving patient was drinking.  This morning patient did not get up, the granddaughter noticed patient speech was gibberish but thought this was due to her being intoxicated.  Patient was in bed all day and could not getting words out and daughter finally decided to call EMS.  Patient did not arrive as a code stroke as she was  outside the 24-hour window. On arrival patient had left gaze deviation, right-sided weakness and was aphasic and severely dysarthric following only some commands.  Stat CT head was obtained which showed large acute left MCA stroke, CTA confirmed a left M2 occlusion.  Unfortunately patient not a candidate for thrombectomy due to being outside the window and this is an established infarct.      SLP Plan  MBS       Recommendations  Diet recommendations: Dysphagia 1 (puree);Nectar-thick liquid Liquids provided via: Straw Medication Administration: Whole meds with puree Supervision: Staff to assist with self feeding Compensations: Slow rate;Small sips/bites;Minimize environmental distractions Postural Changes and/or Swallow Maneuvers: Seated upright 90 degrees                Oral Care Recommendations: Oral care BID Follow up Recommendations: Inpatient Rehab SLP Visit Diagnosis: Dysphagia, oropharyngeal phase (R13.12) Plan: MBS       GO                 Kerrie Pleasure, MA, CCC-SLP Acute Rehabilitation Services Office: [phone] 04/27/2020, 11:24 AM

## 2020-04-27 NOTE — Plan of Care (Signed)
  Problem: Education: Goal: Knowledge of disease or condition will improve Outcome: Progressing Goal: Knowledge of secondary prevention will improve Outcome: Progressing Goal: Knowledge of patient specific risk factors addressed and post discharge goals established will improve Outcome: Progressing Goal: Individualized Educational Video(s) Outcome: Progressing   Problem: Coping: Goal: Will verbalize positive feelings about self Outcome: Progressing Goal: Will identify appropriate support needs Outcome: Progressing   Problem: Self-Care: Goal: Verbalization of feelings and concerns over difficulty with self-care will improve Outcome: Progressing Goal: Ability to communicate needs accurately will improve Outcome: Progressing   Problem: Education: Goal: Knowledge of General Education information will improve Description: Including pain rating scale, medication(s)/side effects and non-pharmacologic comfort measures Outcome: Progressing   Problem: Health Behavior/Discharge Planning: Goal: Ability to manage health-related needs will improve Outcome: Progressing   Problem: Clinical Measurements: Goal: Ability to maintain clinical measurements within normal limits will improve Outcome: Progressing Goal: Will remain free from infection Outcome: Progressing Goal: Diagnostic test results will improve Outcome: Progressing Goal: Respiratory complications will improve Outcome: Progressing Goal: Cardiovascular complication will be avoided Outcome: Progressing   Problem: Activity: Goal: Risk for activity intolerance will decrease Outcome: Progressing   Problem: Nutrition: Goal: Adequate nutrition will be maintained Outcome: Progressing   Problem: Coping: Goal: Level of anxiety will decrease Outcome: Progressing   Problem: Elimination: Goal: Will not experience complications related to bowel motility Outcome: Progressing Goal: Will not experience complications related to  urinary retention Outcome: Progressing   Problem: Pain Managment: Goal: General experience of comfort will improve Outcome: Progressing   Problem: Safety: Goal: Ability to remain free from injury will improve Outcome: Progressing   Problem: Skin Integrity: Goal: Risk for impaired skin integrity will decrease Outcome: Progressing   Problem: Intracerebral Hemorrhage Tissue Perfusion: Goal: Complications of Intracerebral Hemorrhage will be minimized Outcome: Progressing

## 2020-04-27 NOTE — Progress Notes (Signed)
Inpatient Rehabilitation Admissions Coordinator  I contacted pt's daughter, Lindsey [name], by phone to clarify caregiver support possible at home. She states she is still working out family and friends in [place] to assist, but is overwhelmed and trying to arrange. Her 64 year old son ,Lindsey [name], is her now from [place] and will remain until the foreseeable future. I await affirmation of her goals for caregiver supports before proceeding with a possible CIR admit.She states she will need until Friday. I will update TOC.  Ottie Glazier, RN, MSN Rehab Admissions Coordinator [phone] 04/27/2020 2:28 PM

## 2020-04-27 NOTE — Progress Notes (Signed)
PROGRESS NOTE        PATIENT DETAILS Name: Guerry BruinColleen Milsap Age: 64 y.o. Sex: female Date of Birth: 1955-12-09 Admit Date: 04/21/2020 Admitting Physician Anselm Junglinghing T Tu, DO ZOX:[id number]:Aycock, Ngwe A, MD  Brief Narrative: Patient is a 64 y.o. female with history of HTN, EtOH use-who presented with right-sided hemiparesis, dysarthria-found to have acute CVA and A. fib with RVR.  Significant events: 7/20>> admit for acute CVA and A. fib with RVR  Significant studies: 7/22>> CT head: Evolving left MCA infarct with increasing edema and increased rightward shift, small hemorrhages within the infarct without significant progression. 7/21>> LDL: 127 7/21>> A1c: 6.1 7/21>> echo: Pending 7/21>> CT head: Evolving left MCA infarct with unchanged small areas of hemorrhage. 7/20>> MRI brain: Acute infarct left MCA territory-mild hemorrhage in the infarct 7/20>> CTA head/neck: Acute occlusion of the inferior division of left M2, no other intracranial/extracranial stenosis.  Antimicrobial therapy: None  Microbiology data: 7/20>> blood culture: No growth  Procedures : None  Consults: Neurology  DVT Prophylaxis : enoxaparin (LOVENOX) injection 40 mg Start: 04/22/20 1100  Subjective: Awake-alert-dysarthria slowly improving-wants NG tube out.  Does not like pured eggs.  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 fib.  See work-up results above.  Neurology following-continue ASA for now-repeat CT head on 7/23-reviewed with Dr. Bascom LevelsSethi-he recommends that we reach out to the stroke team 1 week from 7/23-before proceeding with anticoagulation.   Atrial fibrillation with RVR: Continues to have intermittent RVR-cardiology following-beta-blocker dosage increased.  Cardiology following.  Currently not a candidate for anticoagulation due to hemorrhagic  transformation of CVA.   Dysphagia: Secondary to CVA-NG tube in place-seems to be tolerating dysphagia 1 diet.  Appreciate SLP eval. suspect we can remove NG tube-as she seems to be tolerating oral intake well.  HTN: BP stable-continue metoprolol.  HLD: Continue statin  Hypokalemia: Repleted  Hypomagnesemia: Repleted  EtOH abuse: Per daughter-patient does not drink every day-apparently has gone several days without withdrawal symptoms when not drinking.  Watch closely for withdrawal symptoms-currently none.  Diet: Diet Order            DIET - DYS 1 Room service appropriate? No; Fluid consistency: Nectar Thick  Diet effective now                  Code Status: Full code   Family Communication: Daughter over the phone on 7/24-we will update over the next few days-her condition appears unchanged compared to yesterday.  Disposition Plan: Status is: Inpatient  Remains inpatient appropriate because:Inpatient level of care appropriate due to severity of illness   Dispo: The patient is from: Home              Anticipated d/c is to: CIR              Anticipated d/c date is: 2 days              Patient currently is not medically stable to d/c.  Barriers to Discharge: Acute CVA with dense right-sided hemiplegia/dysphagia  Antimicrobial agents: Anti-infectives (From admission, onward)   None       Time spent: 25 minutes-Greater than 50% of this time was spent in counseling, explanation of diagnosis, planning of further management, and coordination of care.  MEDICATIONS: Scheduled Meds:  aspirin  300 mg Rectal Daily   Or   aspirin  81 mg Per Tube Daily   atorvastatin  40 mg Per Tube q1800   chlorhexidine  15 mL Mouth Rinse BID   enoxaparin (LOVENOX) injection  40 mg Subcutaneous Q24H   feeding supplement (OSMOLITE 1.5 CAL)  1,000 mL Per Tube Q24H   feeding supplement (PROSource TF)  45 mL Per Tube BID   mouth rinse  15 mL Mouth Rinse q12n4p   metoprolol  tartrate  37.5 mg Per Tube TID   Continuous Infusions: PRN Meds:.acetaminophen **OR** acetaminophen (TYLENOL) oral liquid 160 mg/5 mL **OR** acetaminophen, Resource ThickenUp Clear   PHYSICAL EXAM: Vital signs: Vitals:   04/27/20 0400 04/27/20 0435 04/27/20 0440 04/27/20 0726  BP: (!) 137/106 (!) 111/90  124/84  Pulse: 105 99  85  Resp: 23 21  19   Temp: 98.2 F (36.8 C) 98.1 F (36.7 C)  98.3 F (36.8 C)  TempSrc: Oral Oral  Axillary  SpO2: 100% 97%    Weight:   81.7 kg    Filed Weights   04/25/20 0434 04/26/20 0429 04/27/20 0440  Weight: 76.7 kg 83.4 kg 81.7 kg   Body mass index is 29.97 kg/m.   Gen Exam:Alert awake-not in any distress HEENT:atraumatic, normocephalic Chest: B/L clear to auscultation anteriorly CVS:S1S2 regular Abdomen:soft non tender, non distended Extremities:no edema Neurology: Dysarthric improving-continues to have dense right-sided hemiplegia Skin: no rash  I have personally reviewed following labs and imaging studies  LABORATORY DATA: CBC: Recent Labs  Lab 04/21/20 1749 04/27/20 0357  WBC 5.1 5.3  NEUTROABS 3.7  --   HGB 13.0 12.3  HCT 39.0 37.1  MCV 88.8 89.2  PLT 305 213    Basic Metabolic Panel: Recent Labs  Lab 04/21/20 1749 04/23/20 0443 04/24/20 1141 04/25/20 0920 04/27/20 0357  NA 137 139 139 140 136  K 4.0 3.3* 3.8 4.3 4.5  CL 102 103 102 106 101  CO2 19* 21* 25 24 26   GLUCOSE 128* 157* 145* 146* 146*  BUN 7* 16 20 18 18   CREATININE 0.70 0.83 0.64 0.64 0.62  CALCIUM 9.2 9.2 9.3 9.1 9.2  MG  --   --  1.7 2.0 1.8    GFR: CrCl cannot be calculated (Unknown ideal weight.).  Liver Function Tests: Recent Labs  Lab 04/21/20 1749  AST 56*  ALT 38  ALKPHOS 79  BILITOT 1.4*  PROT 8.4*  ALBUMIN 3.8   No results for input(s): LIPASE, AMYLASE in the last 168 hours. No results for input(s): AMMONIA in the last 168 hours.  Coagulation Profile: Recent Labs  Lab 04/21/20 1749  INR 1.1    Cardiac Enzymes: No  results for input(s): CKTOTAL, CKMB, CKMBINDEX, TROPONINI in the last 168 hours.  BNP (last 3 results) No results for input(s): PROBNP in the last 8760 hours.  Lipid Profile: No results for input(s): CHOL, HDL, LDLCALC, TRIG, CHOLHDL, LDLDIRECT in the last 72 hours.  Thyroid Function Tests: No results for input(s): TSH, T4TOTAL, FREET4, T3FREE, THYROIDAB in the last 72 hours.  Anemia Panel: No results for input(s): VITAMINB12, FOLATE, FERRITIN, TIBC, IRON, RETICCTPCT in the last 72 hours.  Urine analysis:    Component Value Date/Time   COLORURINE YELLOW 04/21/2020 2000   APPEARANCEUR CLEAR 04/21/2020 2000   APPEARANCEUR Clear 08/17/2014 0948   LABSPEC 1.042 (H) 04/21/2020 2000   LABSPEC 1.018 08/17/2014 0948   PHURINE 6.0 04/21/2020 2000   GLUCOSEU NEGATIVE 04/21/2020 2000   GLUCOSEU Negative 08/17/2014 0948  HGBUR NEGATIVE 04/21/2020 2000   BILIRUBINUR NEGATIVE 04/21/2020 2000   BILIRUBINUR Negative 08/17/2014 0948   KETONESUR 20 (A) 04/21/2020 2000   PROTEINUR NEGATIVE 04/21/2020 2000   NITRITE NEGATIVE 04/21/2020 2000   LEUKOCYTESUR NEGATIVE 04/21/2020 2000   LEUKOCYTESUR Negative 08/17/2014 0948    Sepsis Labs: Lactic Acid, Venous No results found for: LATICACIDVEN  MICROBIOLOGY: Recent Results (from the past 240 hour(s))  SARS Coronavirus 2 by RT PCR (hospital order, performed in [facility] hospital lab) Nasopharyngeal Nasopharyngeal Swab     Status: None   Collection Time: 04/21/20  6:51 PM   Specimen: Nasopharyngeal Swab  Result Value Ref Range Status   SARS Coronavirus 2 NEGATIVE NEGATIVE Final    Comment: (NOTE) SARS-CoV-2 target nucleic acids are NOT DETECTED.  The SARS-CoV-2 RNA is generally detectable in upper and lower respiratory specimens during the acute phase of infection. The lowest concentration of SARS-CoV-2 viral copies this assay can detect is 250 copies / mL. A negative result does not preclude SARS-CoV-2 infection and should not be used  as the sole basis for treatment or other patient management decisions.  A negative result may occur with improper specimen collection / handling, submission of specimen other than nasopharyngeal swab, presence of viral mutation(s) within the areas targeted by this assay, and inadequate number of viral copies (<250 copies / mL). A negative result must be combined with clinical observations, patient history, and epidemiological information.  Fact Sheet for Patients:   BoilerBrush.com.cy  Fact Sheet for Healthcare Providers: https://pope.com/  This test is not yet approved or  cleared by the [place] FDA and has been authorized for detection and/or diagnosis of SARS-CoV-2 by FDA under an Emergency Use Authorization (EUA).  This EUA will remain in effect (meaning this test can be used) for the duration of the COVID-19 declaration under Section 564(b)(1) of the Act, 21 U.S.C. section 360bbb-3(b)(1), unless the authorization is terminated or revoked sooner.  Performed at [facility], [address], [place], [place] [address]   Culture, blood (Routine X 2) w Reflex to ID Panel     Status: None   Collection Time: 04/21/20  8:25 PM   Specimen: BLOOD RIGHT HAND  Result Value Ref Range Status   Specimen Description BLOOD RIGHT HAND  Final   Special Requests   Final    BOTTLES DRAWN AEROBIC AND ANAEROBIC Blood Culture adequate volume   Culture   Final    NO GROWTH 5 DAYS Performed at [facility], [address], [place], [place] [address]    Report Status 04/26/2020 FINAL  Final  MRSA PCR Screening     Status: None   Collection Time: 04/22/20  1:17 AM   Specimen: Nasal Mucosa; Nasopharyngeal  Result Value Ref Range Status   MRSA by PCR NEGATIVE NEGATIVE Final    Comment:        The GeneXpert MRSA Assay (FDA approved for NASAL specimens only), is one component of a comprehensive MRSA colonization surveillance  program. It is not intended to diagnose MRSA infection nor to guide or monitor treatment for MRSA infections. Performed at [facility], [address], [place], [place] [address]     RADIOLOGY STUDIES/RESULTS: No results found.   LOS: 6 days   Jeoffrey Massed, MD  [facility]    To contact the attending provider between 7A-7P or the covering provider during after hours 7P-7A, please log into the web site [email or web address] and access using universal [place]  password for that web site. If you do not have the password, please call the hospital operator.  04/27/2020, 12:13 PM

## 2020-04-27 NOTE — Progress Notes (Signed)
Physical Therapy Treatment Patient Details Name: Lindsey [name] MRN: [id number] DOB: 03-01-1956 Today's Date: 04/27/2020    History of Present Illness Pt is a 64 y.o. female admitted 04/21/20 with R weakness, facial droop, and severe dysarthria. CT and MRI brain reveal large L MCA, initially ischemic but with some hemorrhagic conversion. CT scan 7/23 shows evolving large L MCA infarct with stable hemorrhagic transformation but increasing edema and 3 mm left-to-right shift. PMH includes HTN, afib with RVR.    PT Comments    Pt with good visual tracking towards R with max tactile and verbal cuing, crossing midline multiple times to attend to R. Pt still requiring mod-max +2 for bed mobility, but able to transfer multiple times with use of stedy, demonstrating improving activity tolerance, activity initiation and participation. Pt still densely hemiplegic on R, but attends to R with cuing and uses LUE to assist with R side as needed. PT continuing to recommend CIR to maximize functional recovery.   Follow Up Recommendations  CIR;Supervision/Assistance - 24 hour     Equipment Recommendations  Other (comment) (TBD)    Recommendations for Other Services       Precautions / Restrictions Precautions Precautions: Fall Precaution Comments: R neglect, R hemi Restrictions Weight Bearing Restrictions: No    Mobility  Bed Mobility Overal bed mobility: Needs Assistance Bed Mobility: Rolling;Sidelying to Sit;Sit to Supine Rolling: Mod assist Sidelying to sit: Max assist;+2 for safety/equipment;+2 for physical assistance;HOB elevated   Sit to supine: +2 for safety/equipment;+2 for physical assistance;HOB elevated;Max assist   General bed mobility comments: Mod assist for roll to R for trunk translation, LE management, and RUE protection during rolling. Max +2 for supine<>sit for trunk and LE management, scooting up in bed with use of bed pads.  Transfers Overall transfer level: Needs  assistance   Transfers: Sit to/from Stand Sit to Stand: Mod assist;+2 physical assistance;+2 safety/equipment;From elevated surface         General transfer comment: Mod assist +2 for power up, steadying, and correcting heavy R lateral leaning. Verbal cuing for hand placement and strong use of LLE when rising. Sit to stand x1 from bed and x3 from stedy.  Ambulation/Gait             General Gait Details: unable   Stairs             Wheelchair Mobility    Modified Rankin (Stroke Patients Only) Modified Rankin (Stroke Patients Only) Pre-Morbid Rankin Score: No symptoms Modified Rankin: Severe disability     Balance Overall balance assessment: Needs assistance Sitting-balance support: Feet supported;Single extremity supported Sitting balance-Leahy Scale: Poor Sitting balance - Comments: Assist ranging from close min guard to max assist. EOB sitting x12 minutes. Postural control: Right lateral lean Standing balance support: Single extremity supported Standing balance-Leahy Scale: Zero Standing balance comment: Using stedy and assist of 2                            Cognition Arousal/Alertness: Awake/alert Behavior During Therapy: Flat affect Overall Cognitive Status: Difficult to assess Area of Impairment: Following commands;Safety/judgement;Awareness;Problem solving                       Following Commands: Follows one step commands inconsistently;Follows one step commands with increased time Safety/Judgement: Decreased awareness of deficits;Decreased awareness of safety Awareness: Intellectual Problem Solving: Slow processing;Difficulty sequencing;Decreased initiation;Requires verbal cues;Requires tactile cues General Comments: Patient's cognition is improving.  Initiation  is more consistent, as well as following ocmmands.  Still having motor planning and imitation difficulty.  When ask to point L index finger patient unable, waving fingers  instead or putting index finger down.      Exercises General Exercises - Lower Extremity Ankle Circles/Pumps: PROM;Right;5 reps;Supine Heel Slides: PROM;Right;5 reps;Supine Other Exercises Other Exercises: AP and lateral trunk leaning in sitting, x5 each direction, requires min assist to perform Other Exercises: Postural correction in sitting, with pelvic facilitation on L and truncal correction towards L as pt tends to lean R    General Comments General comments (skin integrity, edema, etc.): HR increased to 129 by time of standing as patient stood many times after sitting EOB ~12 min.      Pertinent Vitals/Pain Pain Assessment: Faces Faces Pain Scale: No hurt Pain Intervention(s): Limited activity within patient's tolerance    Home Living                      Prior Function            PT Goals (current goals can now be found in the care plan section) Acute Rehab PT Goals Patient Stated Goal: No goal stated but wanting to do better PT Goal Formulation: With patient Time For Goal Achievement: 05/06/20 Potential to Achieve Goals: Good Progress towards PT goals: Progressing toward goals    Frequency    Min 4X/week      PT Plan Current plan remains appropriate    Co-evaluation PT/OT/SLP Co-Evaluation/Treatment: Yes Reason for Co-Treatment: For patient/therapist safety;To address functional/ADL transfers;Complexity of the patient's impairments (multi-system involvement) PT goals addressed during session: Mobility/safety with mobility;Balance;Proper use of DME;Strengthening/ROM OT goals addressed during session: ADL's and self-care;Strengthening/ROM      AM-PAC PT "6 Clicks" Mobility   Outcome Measure  Help needed turning from your back to your side while in a flat bed without using bedrails?: A Lot Help needed moving from lying on your back to sitting on the side of a flat bed without using bedrails?: A Lot Help needed moving to and from a bed to a chair  (including a wheelchair)?: Total Help needed standing up from a chair using your arms (e.g., wheelchair or bedside chair)?: Total Help needed to walk in hospital room?: Total Help needed climbing 3-5 steps with a railing? : Total 6 Click Score: 8    End of Session Equipment Utilized During Treatment: Gait belt Activity Tolerance: Patient tolerated treatment well;Patient limited by fatigue Patient left: in bed;with call bell/phone within reach;with bed alarm set Nurse Communication: Mobility status PT Visit Diagnosis: Other abnormalities of gait and mobility (R26.89);Hemiplegia and hemiparesis Hemiplegia - Right/Left: Right Hemiplegia - dominant/non-dominant: Dominant Hemiplegia - caused by: Cerebral infarction     Time: 1137-1214 PT Time Calculation (min) (ACUTE ONLY): 37 min  Charges:  $Therapeutic Activity: 8-22 mins                     Chassity Ludke E, PT Acute Rehabilitation Services Pager [phone]  Office [phone]    Agustina Witzke D Despina Hidden 04/27/2020, 4:29 PM

## 2020-04-28 ENCOUNTER — Inpatient Hospital Stay (HOSPITAL_COMMUNITY): Payer: Medicaid Other

## 2020-04-28 LAB — GLUCOSE, CAPILLARY
Glucose-Capillary: 101 mg/dL — ABNORMAL HIGH (ref 70–99)
Glucose-Capillary: 104 mg/dL — ABNORMAL HIGH (ref 70–99)
Glucose-Capillary: 106 mg/dL — ABNORMAL HIGH (ref 70–99)
Glucose-Capillary: 124 mg/dL — ABNORMAL HIGH (ref 70–99)
Glucose-Capillary: 138 mg/dL — ABNORMAL HIGH (ref 70–99)
Glucose-Capillary: 143 mg/dL — ABNORMAL HIGH (ref 70–99)
Glucose-Capillary: 147 mg/dL — ABNORMAL HIGH (ref 70–99)

## 2020-04-28 MED ORDER — METOPROLOL TARTRATE 50 MG PO TABS
50.0000 mg | ORAL_TABLET | Freq: Three times a day (TID) | ORAL | Status: DC
  Administered 2020-04-28 – 2020-04-29 (×3): 50 mg
  Filled 2020-04-28 (×3): qty 1

## 2020-04-28 NOTE — Progress Notes (Signed)
Physical Therapy Treatment Patient Details Name: Lindsey [name] MRN: [id number] DOB: April 11, 1956 Today's Date: 04/28/2020    History of Present Illness Pt is a 64 y.o. female admitted 04/21/20 with R weakness, facial droop, and severe dysarthria. CT and MRI brain reveal large L MCA, initially ischemic but with some hemorrhagic conversion. CT scan 7/23 shows evolving large L MCA infarct with stable hemorrhagic transformation but increasing edema and 3 mm left-to-right shift. PMH includes HTN, afib with RVR.    PT Comments    Pt with continually improving awareness and command following, with PT encouraging pt to verbalize throughout session. Pt demonstrates great initiation and participation in bed mobility, sitting and standing balance tasks, and sit to stands, requiring min +2 for sit to stands in stedy this day. No active contractions observed RUE/LE when assessed. Pt left up in chair, NT and RN aware. PT continuing to recommend CIR to maximize functional recovery post-acutely, pt is motivated. Will continue to follow.     Follow Up Recommendations  CIR;Supervision/Assistance - 24 hour     Equipment Recommendations  Other (comment) (TBD)    Recommendations for Other Services       Precautions / Restrictions Precautions Precautions: Fall Precaution Comments: R neglect, R hemi Restrictions Weight Bearing Restrictions: No    Mobility  Bed Mobility Overal bed mobility: Needs Assistance Bed Mobility: Rolling;Sidelying to Sit Rolling: Mod assist Sidelying to sit: Mod assist;HOB elevated       General bed mobility comments: Mod assist for roll to R and trunk elevation/LE management. Pt with use of HHA, bedrails, and increased time to perform.  Transfers Overall transfer level: Needs assistance Equipment used: Ambulation equipment used Transfers: Sit to/from Stand Sit to Stand: Min assist;+2 physical assistance;From elevated surface;+2 safety/equipment         General  transfer comment: Min +2 for power up, steadying, and correcting heavy R lateral leaning. STS x1 from EOB, and x4 from stedy. Verbal cuing for power up through LLE, use of LUE to pull up.  Ambulation/Gait             General Gait Details: unable   Stairs             Wheelchair Mobility    Modified Rankin (Stroke Patients Only) Modified Rankin (Stroke Patients Only) Pre-Morbid Rankin Score: No symptoms Modified Rankin: Severe disability     Balance Overall balance assessment: Needs assistance Sitting-balance support: Feet supported;Single extremity supported Sitting balance-Leahy Scale: Poor Sitting balance - Comments: periods of min guard, occasional min assist to correct R lateral leaning. PT facilitating RUE propping for proprioceptive input Postural control: Right lateral lean Standing balance support: Single extremity supported Standing balance-Leahy Scale: Zero Standing balance comment: Using stedy and assist of 2                            Cognition Arousal/Alertness: Awake/alert Behavior During Therapy: Flat affect Overall Cognitive Status: Impaired/Different from baseline Area of Impairment: Attention;Following commands;Safety/judgement                   Current Attention Level: Sustained   Following Commands: Follows one step commands with increased time Safety/Judgement: Decreased awareness of safety;Decreased awareness of deficits   Problem Solving: Slow processing;Difficulty sequencing;Decreased initiation;Requires verbal cues;Requires tactile cues General Comments: Consistently performs one-step commands when asked, requires multimodal cuing especially for attention to R      Exercises General Exercises - Lower Extremity Ankle Circles/Pumps: PROM;Right;AROM;Left;15 reps;Seated  Heel Slides: PROM;Right;5 reps;Supine Other Exercises Other Exercises: RUE and RLE propping with pillows for joint protection and edema management     General Comments        Pertinent Vitals/Pain Pain Assessment: Faces Faces Pain Scale: No hurt Pain Intervention(s): Limited activity within patient's tolerance;Monitored during session    Home Living                      Prior Function            PT Goals (current goals can now be found in the care plan section) Acute Rehab PT Goals PT Goal Formulation: With patient Time For Goal Achievement: 05/06/20 Potential to Achieve Goals: Good Progress towards PT goals: Progressing toward goals    Frequency    Min 4X/week      PT Plan Current plan remains appropriate    Co-evaluation              AM-PAC PT "6 Clicks" Mobility   Outcome Measure  Help needed turning from your back to your side while in a flat bed without using bedrails?: A Lot Help needed moving from lying on your back to sitting on the side of a flat bed without using bedrails?: A Lot Help needed moving to and from a bed to a chair (including a wheelchair)?: Total Help needed standing up from a chair using your arms (e.g., wheelchair or bedside chair)?: Total Help needed to walk in hospital room?: Total Help needed climbing 3-5 steps with a railing? : Total 6 Click Score: 8    End of Session Equipment Utilized During Treatment: Gait belt Activity Tolerance: Patient tolerated treatment well;Patient limited by fatigue Patient left: with call bell/phone within reach;in chair;with chair alarm set Nurse Communication: Mobility status PT Visit Diagnosis: Other abnormalities of gait and mobility (R26.89);Hemiplegia and hemiparesis Hemiplegia - Right/Left: Right Hemiplegia - dominant/non-dominant: Dominant Hemiplegia - caused by: Cerebral infarction     Time: [id number] PT Time Calculation (min) (ACUTE ONLY): 29 min  Charges:  $Therapeutic Activity: 8-22 mins $Neuromuscular Re-education: 8-22 mins                    Celina Shiley E, PT Acute Rehabilitation Services Pager [phone]  Office  [phone]   Avir Deruiter D Shaine Mount 04/28/2020, 11:42 AM

## 2020-04-28 NOTE — Progress Notes (Addendum)
Modified Barium Swallow Progress Note  Patient Details  Name: Lindsey [name] MRN: [id number] Date of Birth: July 23, 1956  Today's Date: 04/28/2020  Modified Barium Swallow completed.  Full report located under Chart Review in the Imaging Section.  Brief recommendations include the following:  Clinical Impression  Pt presents with a primary moderate oral dysphagia marked by decreased sensation on right side of buccal cavity and decreased bolus cohesion/manipulation.  These deficits led to moderate residue in the right side of mouth without pt's awareness.  Pharyngeally, pt's swalllow was quite functional.  The pharyngeal swallow was initiated between the valleculae and pyriforms, there was reliable laryngeal vestibule closure for all consistencies (high penetration x 1 with mixed liquid/solid consistency, PAS score #2), and no aspiration.  There was good pharyngeal squeeze and no residue in the pharynx post-swallow.  Recommend advancing diet to dysphagia 2, thin liquids.  Given sensory deficits, pt will require full supervision to ensure safety and attention to left side of mouth during meals.  Give meds whole in puree.  SLP will continue to follow.   Addendum:   After MBS, worked with pt in room, who was coughing extensively with water from a straw.  With [facility] elevated as much as tolerated and small sips from the edge of a cup, coughing was eliminated.  Daughter, [name], was educated re: results/recs via phone; [name] present.    Swallow Evaluation Recommendations       SLP Diet Recommendations: Dysphagia 2 (Fine chop) solids;Thin liquid   Liquid Administration via: Cup; NO STRAWS   Medication Administration: Whole meds with puree   Supervision: Patient able to self feed;Full supervision/cueing for compensatory strategies   Compensations: Minimize environmental distractions;Slow rate;Small sips/bites;Monitor for anterior loss       Oral Care Recommendations: Oral care BID        Addisyn Leclaire L. Samson Frederic, MA CCC/SLP Acute Rehabilitation Services Office number [phone] Pager [phone]  Blenda Mounts Laurice 04/28/2020,2:10 PM

## 2020-04-28 NOTE — Progress Notes (Addendum)
PROGRESS NOTE        PATIENT DETAILS Name: Lindsey [name] Age: 64 y.o. Sex: female Date of Birth: Jun 01, 1956 Admit Date: 04/21/2020 Admitting Physician Anselm Jungling, DO QMV:HQIONG, Ngwe A, MD  Brief Narrative: Patient is a 64 y.o. female with history of HTN, EtOH use-who presented with right-sided hemiparesis, dysarthria-found to have acute CVA and A. fib with RVR.  Significant events: 7/20>> admit for acute CVA and A. fib with RVR 7/27>> upgraded to dysphagia 2 diet-coretrak tube removed.  Significant studies: 7/23>> CT head: Evolving left MCA infarct with increasing edema and increased rightward shift, small hemorrhages within the infarct without significant progression. 7/21>> LDL: 127 7/21>> A1c: 6.1 7/21>> echo: EF 60-65% 7/21>> CT head: Evolving left MCA infarct with unchanged small areas of hemorrhage. 7/20>> MRI brain: Acute infarct left MCA territory-mild hemorrhage in the infarct 7/20>> CTA head/neck: Acute occlusion of the inferior division of left M2, no other intracranial/extracranial stenosis.  Antimicrobial therapy: None  Microbiology data: 7/20>> blood culture: No growth  Procedures : None  Consults: Neurology  DVT Prophylaxis : enoxaparin (LOVENOX) injection 40 mg Start: 04/22/20 1100  Subjective: Dysarthria is improved-she once NG tube removed.  SLP is ablated diet.  No major issues overnight.  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 fib.  See work-up results above.  Neurology following-continue ASA for now-repeat CT head on 7/23-reviewed with Dr. Bascom Levels recommends that we reach out to the stroke team 1 week from 7/23-before proceeding with anticoagulation.   Atrial fibrillation with RVR: Continues to have intermittent RVR-cardiology following-and adjusting beta-blockade dosage.    Currently not a  candidate for anticoagulation due to hemorrhagic transformation of CVA.   Dysphagia: Secondary to CVA-had NG tube in place-evaluated by SLP today-upgraded to dysphagia 2 diet-Per nursing staff-tolerating oral intake well-discontinue NG tube-consult nutrition for maximizing supplements.  Follow closely.  Appreciate SLP evaluation.    HTN: BP stable-continue metoprolol.  HLD: Continue statin  Hypokalemia: Repleted  Hypomagnesemia: Repleted  EtOH abuse: Per daughter-patient does not drink every day-apparently has gone several days without withdrawal symptoms when not drinking.  Out of window for withdrawal symptoms.  Diet: Diet Order            DIET DYS 2 Room service appropriate? Yes with Assist; Fluid consistency: Thin  Diet effective now                  Code Status: Full code   Family Communication: Daughter over the phone on 7/27  Disposition Plan: Status is: Inpatient  Remains inpatient appropriate because:Inpatient level of care appropriate due to severity of illness   Dispo: The patient is from: Home              Anticipated d/c is to: CIR              Anticipated d/c date is: 1 days              Patient currently is  medically stable to d/c.  Barriers to Discharge: Awaiting CIR bed-NG tube removed today-see how she does with oral intake over the next few days.  Antimicrobial agents: Anti-infectives (From admission, onward)   None       Time spent: 25 minutes-Greater than 50% of this time was spent in counseling, explanation  of diagnosis, planning of further management, and coordination of care.  MEDICATIONS: Scheduled Meds: . aspirin  300 mg Rectal Daily   Or  . aspirin  81 mg Per Tube Daily  . atorvastatin  40 mg Per Tube q1800  . chlorhexidine  15 mL Mouth Rinse BID  . enoxaparin (LOVENOX) injection  40 mg Subcutaneous Q24H  . feeding supplement (OSMOLITE 1.5 CAL)  1,000 mL Per Tube Q24H  . feeding supplement (PROSource TF)  45 mL Per Tube BID  .  mouth rinse  15 mL Mouth Rinse q12n4p  . metoprolol tartrate  50 mg Per Tube TID   Continuous Infusions: PRN Meds:.acetaminophen **OR** acetaminophen (TYLENOL) oral liquid 160 mg/5 mL **OR** acetaminophen, Resource ThickenUp Clear   PHYSICAL EXAM: Vital signs: Vitals:   04/28/20 0725 04/28/20 0728 04/28/20 1130 04/28/20 1504  BP:  (!) 113/87 115/73 (!) 132/83  Pulse: 91 91 96 98  Resp:   18   Temp: 97.7 F (36.5 C) 97.7 F (36.5 C) 98 F (36.7 C)   TempSrc: Axillary Axillary Oral   SpO2:   99%   Weight:       Filed Weights   04/26/20 0429 04/27/20 0440 04/28/20 0412  Weight: 83.4 kg 81.7 kg 80.4 kg   Body mass index is 29.5 kg/m.   Gen Exam:Alert awake-not in any distress HEENT:atraumatic, normocephalic Chest: B/L clear to auscultation anteriorly CVS:S1S2 regular Abdomen:soft non tender, non distended Extremities:no edema Neurology: Right-sided hemiplegia-unchanged-dysarthria somewhat better. Skin: no rash  I have personally reviewed following labs and imaging studies  LABORATORY DATA: CBC: Recent Labs  Lab 04/21/20 1749 04/27/20 0357  WBC 5.1 5.3  NEUTROABS 3.7  --   HGB 13.0 12.3  HCT 39.0 37.1  MCV 88.8 89.2  PLT 305 213    Basic Metabolic Panel: Recent Labs  Lab 04/21/20 1749 04/23/20 0443 04/24/20 1141 04/25/20 0920 04/27/20 0357  NA 137 139 139 140 136  K 4.0 3.3* 3.8 4.3 4.5  CL 102 103 102 106 101  CO2 19* 21* 25 24 26   GLUCOSE 128* 157* 145* 146* 146*  BUN 7* 16 20 18 18   CREATININE 0.70 0.83 0.64 0.64 0.62  CALCIUM 9.2 9.2 9.3 9.1 9.2  MG  --   --  1.7 2.0 1.8    GFR: CrCl cannot be calculated (Unknown ideal weight.).  Liver Function Tests: Recent Labs  Lab 04/21/20 1749  AST 56*  ALT 38  ALKPHOS 79  BILITOT 1.4*  PROT 8.4*  ALBUMIN 3.8   No results for input(s): LIPASE, AMYLASE in the last 168 hours. No results for input(s): AMMONIA in the last 168 hours.  Coagulation Profile: Recent Labs  Lab 04/21/20 1749  INR  1.1    Cardiac Enzymes: No results for input(s): CKTOTAL, CKMB, CKMBINDEX, TROPONINI in the last 168 hours.  BNP (last 3 results) No results for input(s): PROBNP in the last 8760 hours.  Lipid Profile: No results for input(s): CHOL, HDL, LDLCALC, TRIG, CHOLHDL, LDLDIRECT in the last 72 hours.  Thyroid Function Tests: No results for input(s): TSH, T4TOTAL, FREET4, T3FREE, THYROIDAB in the last 72 hours.  Anemia Panel: No results for input(s): VITAMINB12, FOLATE, FERRITIN, TIBC, IRON, RETICCTPCT in the last 72 hours.  Urine analysis:    Component Value Date/Time   COLORURINE YELLOW 04/21/2020 2000   APPEARANCEUR CLEAR 04/21/2020 2000   APPEARANCEUR Clear 08/17/2014 0948   LABSPEC 1.042 (H) 04/21/2020 2000   LABSPEC 1.018 08/17/2014 0948   PHURINE 6.0 04/21/2020 2000  GLUCOSEU NEGATIVE 04/21/2020 2000   GLUCOSEU Negative 08/17/2014 0948   HGBUR NEGATIVE 04/21/2020 2000   BILIRUBINUR NEGATIVE 04/21/2020 2000   BILIRUBINUR Negative 08/17/2014 0948   KETONESUR 20 (A) 04/21/2020 2000   PROTEINUR NEGATIVE 04/21/2020 2000   NITRITE NEGATIVE 04/21/2020 2000   LEUKOCYTESUR NEGATIVE 04/21/2020 2000   LEUKOCYTESUR Negative 08/17/2014 0948    Sepsis Labs: Lactic Acid, Venous No results found for: LATICACIDVEN  MICROBIOLOGY: Recent Results (from the past 240 hour(s))  SARS Coronavirus 2 by RT PCR (hospital order, performed in [facility] hospital lab) Nasopharyngeal Nasopharyngeal Swab     Status: None   Collection Time: 04/21/20  6:51 PM   Specimen: Nasopharyngeal Swab  Result Value Ref Range Status   SARS Coronavirus 2 NEGATIVE NEGATIVE Final    Comment: (NOTE) SARS-CoV-2 target nucleic acids are NOT DETECTED.  The SARS-CoV-2 RNA is generally detectable in upper and lower respiratory specimens during the acute phase of infection. The lowest concentration of SARS-CoV-2 viral copies this assay can detect is 250 copies / mL. A negative result does not preclude SARS-CoV-2  infection and should not be used as the sole basis for treatment or other patient management decisions.  A negative result may occur with improper specimen collection / handling, submission of specimen other than nasopharyngeal swab, presence of viral mutation(s) within the areas targeted by this assay, and inadequate number of viral copies (<250 copies / mL). A negative result must be combined with clinical observations, patient history, and epidemiological information.  Fact Sheet for Patients:   BoilerBrush.com.cy  Fact Sheet for Healthcare Providers: https://pope.com/  This test is not yet approved or  cleared by the [place] FDA and has been authorized for detection and/or diagnosis of SARS-CoV-2 by FDA under an Emergency Use Authorization (EUA).  This EUA will remain in effect (meaning this test can be used) for the duration of the COVID-19 declaration under Section 564(b)(1) of the Act, 21 U.S.C. section 360bbb-3(b)(1), unless the authorization is terminated or revoked sooner.  Performed at [facility], [address], [place], [place] [address]   Culture, blood (Routine X 2) w Reflex to ID Panel     Status: None   Collection Time: 04/21/20  8:25 PM   Specimen: BLOOD RIGHT HAND  Result Value Ref Range Status   Specimen Description BLOOD RIGHT HAND  Final   Special Requests   Final    BOTTLES DRAWN AEROBIC AND ANAEROBIC Blood Culture adequate volume   Culture   Final    NO GROWTH 5 DAYS Performed at [facility], [address] [place], [place] [address]    Report Status 04/26/2020 FINAL  Final  MRSA PCR Screening     Status: None   Collection Time: 04/22/20  1:17 AM   Specimen: Nasal Mucosa; Nasopharyngeal  Result Value Ref Range Status   MRSA by PCR NEGATIVE NEGATIVE Final    Comment:        The GeneXpert MRSA Assay (FDA approved for NASAL specimens only), is one component of a comprehensive MRSA  colonization surveillance program. It is not intended to diagnose MRSA infection nor to guide or monitor treatment for MRSA infections. Performed at [facility], [address] [place], [place] [address]     RADIOLOGY STUDIES/RESULTS: DG Swallowing Func-Speech Pathology  Result Date: 04/28/2020 Objective Swallowing Evaluation: Type of Study: MBS-Modified Barium Swallow Study  Patient Details Name: Lindsey [name] MRN: [id number] Date of Birth: Dec 13, 1955 Today's Date: 04/28/2020 Time: SLP  Start Time (ACUTE ONLY): 1310 -SLP Stop Time (ACUTE ONLY): 1340 SLP Time Calculation (min) (ACUTE ONLY): 30 min Past Medical History: Past Medical History: Diagnosis Date . Diverticulosis  . Hypertension  . Uterine prolapse  Past Surgical History: No past surgical history on file. HPI: Pt is a 64 y.o. female admitted 04/21/20 with R weakness, facial droop, and severe dysarthria. CT and MRI brain reveal large L MCA, initially ischemic but with some hemorrhagic conversion and involving left frontal lobe, insula as well as the caudate and putamen. CT scan 7/23 shows evolving large L MCA infarct with stable hemorrhagic transformation but increasing edema and 3 mm left-to-right shift. PMH includes HTN, afib with RVR.  Subjective: alert Assessment / Plan / Recommendation CHL IP CLINICAL IMPRESSIONS 04/28/2020 Clinical Impression Pt presents with a primary moderate oral dysphagia marked by decreased sensation on right side of buccal cavity and decreased bolus cohesion/manipulation.  These deficits led to moderate residue in the right side of mouth without pt's awareness.  Pharyngeally, pt's swalllow was quite functional.  The pharyngeal swallow was initiated between the valleculae and pyriforms, there was reliable laryngeal vestibule closure for all consistencies (high penetration x 1 with mixed liquid/solid consistency, PAS score #2), and no aspiration.  There was good pharyngeal squeeze and no residue in the pharynx  post-swallow.  Recommend advancing diet to dysphagia 2, thin liquids.  Given sensory deficits, pt will require full supervision to ensure safety and attention to left side of mouth during meals.  Give meds whole in puree.  SLP will continue to follow.  SLP Visit Diagnosis Dysphagia, oral phase (R13.11) Attention and concentration deficit following -- Frontal lobe and executive function deficit following -- Impact on safety and function --   CHL IP TREATMENT RECOMMENDATION 04/28/2020 Treatment Recommendations Defer treatment plan to f/u with SLP   Prognosis 04/28/2020 Prognosis for Safe Diet Advancement Good Barriers to Reach Goals -- Barriers/Prognosis Comment -- CHL IP DIET RECOMMENDATION 04/28/2020 SLP Diet Recommendations Dysphagia 2 (Fine chop) solids;Thin liquid Liquid Administration via Cup;Straw Medication Administration Whole meds with puree Compensations Minimize environmental distractions;Slow rate;Small sips/bites;Monitor for anterior loss Postural Changes --   CHL IP OTHER RECOMMENDATIONS 04/28/2020 Recommended Consults -- Oral Care Recommendations Oral care BID Other Recommendations --   CHL IP FOLLOW UP RECOMMENDATIONS 04/28/2020 Follow up Recommendations Inpatient Rehab   CHL IP FREQUENCY AND DURATION 04/28/2020 Speech Therapy Frequency (ACUTE ONLY) min 2x/week Treatment Duration 2 weeks      CHL IP ORAL PHASE 04/28/2020 Oral Phase Impaired Oral - Pudding Teaspoon -- Oral - Pudding Cup -- Oral - Honey Teaspoon -- Oral - Honey Cup -- Oral - Nectar Teaspoon -- Oral - Nectar Cup -- Oral - Nectar Straw -- Oral - Thin Teaspoon -- Oral - Thin Cup -- Oral - Thin Straw Right anterior bolus loss;Reduced posterior propulsion;Decreased bolus cohesion Oral - Puree Right pocketing in lateral sulci;Lingual/palatal residue;Delayed oral transit;Decreased bolus cohesion Oral - Mech Soft -- Oral - Regular Right pocketing in lateral sulci;Lingual/palatal residue;Delayed oral transit;Decreased bolus cohesion Oral -  Multi-Consistency -- Oral - Pill -- Oral Phase - Comment --  CHL IP PHARYNGEAL PHASE 04/28/2020 Pharyngeal Phase WFL Pharyngeal- Pudding Teaspoon -- Pharyngeal -- Pharyngeal- Pudding Cup -- Pharyngeal -- Pharyngeal- Honey Teaspoon -- Pharyngeal -- Pharyngeal- Honey Cup -- Pharyngeal -- Pharyngeal- Nectar Teaspoon -- Pharyngeal -- Pharyngeal- Nectar Cup -- Pharyngeal -- Pharyngeal- Nectar Straw -- Pharyngeal -- Pharyngeal- Thin Teaspoon -- Pharyngeal -- Pharyngeal- Thin Cup -- Pharyngeal -- Pharyngeal- Thin Straw -- Pharyngeal --  Pharyngeal- Puree -- Pharyngeal -- Pharyngeal- Mechanical Soft -- Pharyngeal -- Pharyngeal- Regular -- Pharyngeal -- Pharyngeal- Multi-consistency -- Pharyngeal -- Pharyngeal- Pill -- Pharyngeal -- Pharyngeal Comment --  No flowsheet data found. Blenda MountsCouture, Amanda Laurice 04/28/2020, 2:11 PM                LOS: 7 days   Jeoffrey MassedShanker Shanon Seawright, MD  [facility]    To contact the attending provider between 7A-7P or the covering provider during after hours 7P-7A, please log into the web site [email or web address] and access using universal [place] password for that web site. If you do not have the password, please call the hospital operator.  04/28/2020, 3:32 PM

## 2020-04-28 NOTE — Progress Notes (Signed)
Progress Note  Patient Name: Lindsey [name] Date of Encounter: 04/28/2020  [facility] Cardiologist: No primary care provider on file. [place]  Subjective   No cardiovascular complaints.  Specifically denies chest pain or shortness of breath. Some improvement in ventricular rate control, high 80s-low 100s.  Blood pressure remains in normal range.  Inpatient Medications    Scheduled Meds: . aspirin  300 mg Rectal Daily   Or  . aspirin  81 mg Per Tube Daily  . atorvastatin  40 mg Per Tube q1800  . chlorhexidine  15 mL Mouth Rinse BID  . enoxaparin (LOVENOX) injection  40 mg Subcutaneous Q24H  . feeding supplement (OSMOLITE 1.5 CAL)  1,000 mL Per Tube Q24H  . feeding supplement (PROSource TF)  45 mL Per Tube BID  . mouth rinse  15 mL Mouth Rinse q12n4p  . metoprolol tartrate  37.5 mg Per Tube TID   Continuous Infusions:  PRN Meds: acetaminophen **OR** acetaminophen (TYLENOL) oral liquid 160 mg/5 mL **OR** acetaminophen, Resource ThickenUp Clear   Vital Signs    Vitals:   04/28/20 0400 04/28/20 0412 04/28/20 0725 04/28/20 0728  BP: (!) 121/89   (!) 113/87  Pulse: 95  91 91  Resp: 23     Temp: 98.3 F (36.8 C)  97.7 F (36.5 C) 97.7 F (36.5 C)  TempSrc: Oral  Axillary Axillary  SpO2: 100%     Weight:  80.4 kg      Intake/Output Summary (Last 24 hours) at 04/28/2020 1004 Last data filed at 04/28/2020 0630 Gross per 24 hour  Intake 2055 ml  Output 1001 ml  Net 1054 ml   Last 3 Weights 04/28/2020 04/27/2020 04/26/2020  Weight (lbs) 177 lb 4 oz 180 lb 1.9 oz 183 lb 13.8 oz  Weight (kg) 80.4 kg 81.7 kg 83.4 kg      Telemetry    Atrial fibrillation with borderline ventricular rate control- Personally Reviewed  ECG    No new tracing- Personally Reviewed  Physical Exam  Feeding tube in place, facial asymmetry GEN: No acute distress.   Neck: No JVD Cardiac:  Irregular, no murmurs, rubs, or gallops.  Respiratory: Clear to auscultation bilaterally. GI:  Soft, nontender, non-distended  MS: No edema; No deformity. Neuro:   Right hemiparesis Psych: Normal affect   Labs    High Sensitivity Troponin:  No results for input(s): TROPONINIHS in the last 720 hours.    Chemistry Recent Labs  Lab 04/21/20 1749 04/23/20 0443 04/24/20 1141 04/25/20 0920 04/27/20 0357  NA 137   < > 139 140 136  K 4.0   < > 3.8 4.3 4.5  CL 102   < > 102 106 101  CO2 19*   < > 25 24 26   GLUCOSE 128*   < > 145* 146* 146*  BUN 7*   < > 20 18 18   CREATININE 0.70   < > 0.64 0.64 0.62  CALCIUM 9.2   < > 9.3 9.1 9.2  PROT 8.4*  --   --   --   --   ALBUMIN 3.8  --   --   --   --   AST 56*  --   --   --   --   ALT 38  --   --   --   --   ALKPHOS 79  --   --   --   --   BILITOT 1.4*  --   --   --   --  GFRNONAA >60   < > >60 >60 >60  GFRAA >60   < > >60 >60 >60  ANIONGAP 16*   < > 12 10 9    < > = values in this interval not displayed.     Hematology Recent Labs  Lab 04/21/20 1749 04/27/20 0357  WBC 5.1 5.3  RBC 4.39 4.16  HGB 13.0 12.3  HCT 39.0 37.1  MCV 88.8 89.2  MCH 29.6 29.6  MCHC 33.3 33.2  RDW 14.8 14.6  PLT 305 213    BNPNo results for input(s): BNP, PROBNP in the last 168 hours.   DDimer No results for input(s): DDIMER in the last 168 hours.   Radiology    No results found.  Cardiac Studies   ECHO 04/22/2020  1. Left ventricular ejection fraction, by estimation, is 60 to 65%. The  left ventricle has normal function. The left ventricle has no regional  wall motion abnormalities. There is mild left ventricular hypertrophy.  Left ventricular diastolic parameters  are indeterminate.  2. Right ventricular systolic function is normal. The right ventricular  size is normal. There is normal pulmonary artery systolic pressure. The  estimated right ventricular systolic pressure is 27.6 mmHg.  3. Left atrial size was mild to moderately dilated.  4. Right atrial size was mildly dilated.  5. The mitral valve is normal in structure.  No evidence of mitral valve  regurgitation. No evidence of mitral stenosis.  6. The aortic valve is tricuspid. Aortic valve regurgitation is not  visualized. No aortic stenosis is present.  7. The inferior vena cava is normal in size with greater than 50%  respiratory variability, suggesting right atrial pressure of 3 mmHg.   Patient Profile     64 y.o. female presenting with L MCA embolic stroke (with hemorrhagic transformation) and newly diagnosed persistent atrial fibrillation with RVR, alcohol abuse and HTN.  Assessment & Plan    Remains off anticoagulants at least until the end of the month. We will increase the beta-blocker dose slightly again.  Continue aspirin and statin.     For questions or updates, please contact [facility] Please consult [email or web address] for contact info under        Signed, 77, MD  04/28/2020, 10:04 AM

## 2020-04-29 DIAGNOSIS — I6340 Cerebral infarction due to embolism of unspecified cerebral artery: Secondary | ICD-10-CM

## 2020-04-29 LAB — BASIC METABOLIC PANEL
Anion gap: 12 (ref 5–15)
BUN: 17 mg/dL (ref 8–23)
CO2: 23 mmol/L (ref 22–32)
Calcium: 9.1 mg/dL (ref 8.9–10.3)
Chloride: 98 mmol/L (ref 98–111)
Creatinine, Ser: 0.54 mg/dL (ref 0.44–1.00)
GFR calc Af Amer: >60 mL/min (ref >60)
GFR calc non Af Amer: >60 mL/min (ref >60)
Glucose, Bld: 106 mg/dL — ABNORMAL HIGH (ref 70–99)
Potassium: 4.5 mmol/L (ref 3.5–5.1)
Sodium: 133 mmol/L — ABNORMAL LOW (ref 135–145)

## 2020-04-29 LAB — GLUCOSE, CAPILLARY
Glucose-Capillary: 96 mg/dL (ref 70–99)
Glucose-Capillary: 87 mg/dL (ref 70–99)
Glucose-Capillary: 105 mg/dL — ABNORMAL HIGH (ref 70–99)
Glucose-Capillary: 99 mg/dL (ref 70–99)
Glucose-Capillary: 97 mg/dL (ref 70–99)

## 2020-04-29 LAB — CBC
HCT: 35.5 % — ABNORMAL LOW (ref 36.0–46.0)
Hemoglobin: 11.7 g/dL — ABNORMAL LOW (ref 12.0–15.0)
MCH: 29.3 pg (ref 26.0–34.0)
MCHC: 33 g/dL (ref 30.0–36.0)
MCV: 88.8 fL (ref 80.0–100.0)
Platelets: 233 10*3/uL (ref 150–400)
RBC: 4 MIL/uL (ref 3.87–5.11)
RDW: 14.2 % (ref 11.5–15.5)
WBC: 4.5 10*3/uL (ref 4.0–10.5)
nRBC: 0 % (ref 0.0–0.2)

## 2020-04-29 MED ORDER — METOPROLOL TARTRATE 50 MG PO TABS
75.0000 mg | ORAL_TABLET | Freq: Two times a day (BID) | ORAL | Status: DC
  Administered 2020-04-29 – 2020-04-30 (×2): 75 mg via ORAL
  Filled 2020-04-29 (×2): qty 1

## 2020-04-29 MED ORDER — ENSURE ENLIVE PO LIQD
237.0000 mL | Freq: Three times a day (TID) | ORAL | Status: DC
  Administered 2020-04-29 – 2020-04-30 (×3): 237 mL via ORAL

## 2020-04-29 NOTE — Plan of Care (Signed)
  Problem: Education: Goal: Knowledge of disease or condition will improve Outcome: Progressing   

## 2020-04-29 NOTE — Progress Notes (Signed)
Inpatient Rehabilitation Admissions Coordinator  I contacted patient's daughter, [name], by phone to clarify caregiver support available after a possible CIR admission. She states she can arrange caregivers /family to come form [place] once rehab completed. I discussed Covid visitor restrictions of 2 visitors per rehab admission. She would like me to clarify the ability of other family from [place] to be able to visit. I will clarify with our rehab leadership and follow up tomorrow.  Ottie Glazier, RN, MSN Rehab Admissions Coordinator [phone] 04/29/2020 4:05 PM

## 2020-04-29 NOTE — Progress Notes (Signed)
Physical Therapy Treatment Patient Details Name: Lindsey [name] MRN: [id number] DOB: 09-29-1956 Today's Date: 04/29/2020    History of Present Illness Pt is a 64 y.o. female admitted 04/21/20 with R weakness, facial droop, and severe dysarthria. CT and MRI brain reveal large L MCA, initially ischemic but with some hemorrhagic conversion. CT scan 7/23 shows evolving large L MCA infarct with stable hemorrhagic transformation but increasing edema and 3 mm left-to-right shift. PMH includes HTN, afib with RVR.    PT Comments    Patient alert; infrequently attempting to verbalize despite encouragement. Speech very garbled. Following one step commands with left extremities. No active movement noted on rt side. Patient noted to have been incontinent of bowels prior to exiting bed and assisted with cleanup with rolling rt and lt several times (to also place clean linens). Stood several times in stedy (pt powers up using only left extremities). Patient incontinent of urine in standing and repeated standing for clean up.    Follow Up Recommendations  CIR;Supervision/Assistance - 24 hour     Equipment Recommendations  Other (comment) (TBD)    Recommendations for Other Services       Precautions / Restrictions Precautions Precautions: Fall Precaution Comments: R neglect, R hemi    Mobility  Bed Mobility Overal bed mobility: Needs Assistance Bed Mobility: Rolling;Sidelying to Sit Rolling: Mod assist;Supervision (mod to left; supervision to right) Sidelying to sit: Mod assist (up from rt side due to room setup)       General bed mobility comments: pt initiating roll to her left with head turn and using LUE on rail, however no recognition that RUE was pulling her back  Transfers Overall transfer level: Needs assistance   Transfers: Sit to/from Stand Sit to Stand: Min assist;+2 physical assistance;From elevated surface;+2 safety/equipment         General transfer comment: Min +2 for  power up, steadying, and correcting heavy R lateral leaning. STS x1 from EOB, and x3 from stedy; x16from recliner. Patient incontinent of urine in standing requiring incr time for clean up/clean linends  Ambulation/Gait             General Gait Details: unable   Stairs             Wheelchair Mobility    Modified Rankin (Stroke Patients Only) Modified Rankin (Stroke Patients Only) Pre-Morbid Rankin Score: No symptoms Modified Rankin: Severe disability     Balance Overall balance assessment: Needs assistance Sitting-balance support: Feet supported;Single extremity supported Sitting balance-Leahy Scale: Poor Sitting balance - Comments: periods of min guard, occasional min assist to correct R lateral leaning. PT facilitating RUE propping for proprioceptive input Postural control: Right lateral lean Standing balance support: Single extremity supported Standing balance-Leahy Scale: Zero Standing balance comment: Using stedy and assist of 2                            Cognition Arousal/Alertness: Awake/alert Behavior During Therapy: Flat affect Overall Cognitive Status: Difficult to assess Area of Impairment: Attention;Following commands                   Current Attention Level: Sustained   Following Commands: Follows one step commands with increased time;Follows one step commands consistently Safety/Judgement: Decreased awareness of safety;Decreased awareness of deficits Awareness: Intellectual Problem Solving: Slow processing;Decreased initiation;Requires verbal cues;Requires tactile cues General Comments: Consistently performs one-step commands when asked, requires multimodal cuing especially for attention to R  Exercises General Exercises - Lower Extremity Ankle Circles/Pumps: PROM;Right Heel Slides: PROM;Right    General Comments        Pertinent Vitals/Pain Pain Assessment: Faces Faces Pain Scale: No hurt    Home Living                       Prior Function            PT Goals (current goals can now be found in the care plan section) Acute Rehab PT Goals Patient Stated Goal: No goal stated but wanting to do better Time For Goal Achievement: 05/06/20 Potential to Achieve Goals: Good Progress towards PT goals: Progressing toward goals    Frequency    Min 4X/week      PT Plan Current plan remains appropriate    Co-evaluation              AM-PAC PT "6 Clicks" Mobility   Outcome Measure  Help needed turning from your back to your side while in a flat bed without using bedrails?: A Lot Help needed moving from lying on your back to sitting on the side of a flat bed without using bedrails?: A Lot Help needed moving to and from a bed to a chair (including a wheelchair)?: Total Help needed standing up from a chair using your arms (e.g., wheelchair or bedside chair)?: Total Help needed to walk in hospital room?: Total Help needed climbing 3-5 steps with a railing? : Total 6 Click Score: 8    End of Session   Activity Tolerance: Patient tolerated treatment well Patient left: with call bell/phone within reach;in chair;with chair alarm set Nurse Communication: Mobility status PT Visit Diagnosis: Other abnormalities of gait and mobility (R26.89);Hemiplegia and hemiparesis Hemiplegia - Right/Left: Right Hemiplegia - dominant/non-dominant: Dominant Hemiplegia - caused by: Cerebral infarction     Time: 0092-3300 PT Time Calculation (min) (ACUTE ONLY): 50 min  Charges:  $Neuromuscular Re-education: 38-52 mins                      Jerolyn Center, PT Pager [phone]    [name] 04/29/2020, 12:53 PM

## 2020-04-29 NOTE — Progress Notes (Signed)
Nutrition Follow-up  DOCUMENTATION CODES:   Not applicable  INTERVENTION:  Provide Ensure Enlive po TID, each supplement provides 350 kcal and 20 grams of protein  Encourage adequate PO intake.   NUTRITION DIAGNOSIS:   Inadequate oral intake related to inability to eat as evidenced by NPO status; diet advanced; progressing  GOAL:   Patient will meet greater than or equal to 90% of their needs; progressing  MONITOR:   PO intake, Supplement acceptance, Skin, Weight trends, Labs, I & O's, Diet advancement  REASON FOR ASSESSMENT:   Consult Enteral/tube feeding initiation and management  ASSESSMENT:   64 y.o. female with history of HTN, EtOH use-who presented with right-sided hemiparesis, dysarthria-found to have acute CVA and A. fib with RVR.  Diet has been advanced to a dysphagia 2 diet with thin liquids. Cortrak NGT removed yesterday. Tube feeding discontinued. Pt has been tolerating her PO diet well. Meal completion has been 25-30%. RD to order nutritional supplements to aid in caloric and protein needs. Labs and medications reviewed.   Diet Order:   Diet Order            DIET DYS 2 Room service appropriate? Yes with Assist; Fluid consistency: Thin  Diet effective now                 EDUCATION NEEDS:   Not appropriate for education at this time  Skin:  Skin Assessment: Reviewed RN Assessment  Last BM:  7/27  Height:   Ht Readings from Last 1 Encounters:  08/07/19 5\' 5"  (1.651 m)    Weight:   Wt Readings from Last 1 Encounters:  04/29/20 77.6 kg    BMI:  Body mass index is 28.47 kg/m.  Estimated Nutritional Needs:   Kcal:  1850-2000  Protein:  90-100 grams  Fluid:  >/= 1.8 L/day  05/01/20, MS, RD, LDN RD pager number/after hours weekend pager number on Amion.

## 2020-04-29 NOTE — Progress Notes (Signed)
PROGRESS NOTE        PATIENT DETAILS Name: Lindsey [name] Age: 64 y.o. Sex: female Date of Birth: [birth year] Admit Date: 04/21/2020 Admitting Physician Anselm Junglinghing T Tu, DO UJW:[id number]:Aycock, Ngwe A, MD  Brief Narrative: Patient is a 64 y.o. female with history of HTN, EtOH use-who presented with right-sided hemiparesis, dysarthria-found to have acute CVA and A. fib with RVR.  Significant events: 7/20>> admit for acute CVA and A. fib with RVR 7/27>> upgraded to dysphagia 2 diet-coretrak tube removed.  Significant studies: 7/23>> CT head: Evolving left MCA infarct with increasing edema and increased rightward shift, small hemorrhages within the infarct without significant progression. 7/21>> LDL: 127 7/21>> A1c: 6.1 7/21>> echo: EF 60-65% 7/21>> CT head: Evolving left MCA infarct with unchanged small areas of hemorrhage. 7/20>> MRI brain: Acute infarct left MCA territory-mild hemorrhage in the infarct 7/20>> CTA head/neck: Acute occlusion of the inferior division of left M2, no other intracranial/extracranial stenosis.  Antimicrobial therapy: None  Microbiology data: 7/20>> blood culture: No growth  Procedures : None  Consults: Neurology  DVT Prophylaxis : enoxaparin (LOVENOX) injection 40 mg Start: 04/22/20 1100  Subjective: Eating breakfast-no major issues overnight.  Awaiting CIR.  NG tube removed yesterday.  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 fib.  See work-up results above.  Neurology following-continue ASA for now-repeat CT head on 7/23-reviewed with Dr. Bascom LevelsSethi-he recommends that we reach out to the stroke team 1 week from 7/23-before proceeding with anticoagulation.   Atrial fibrillation with RVR: Continues to have intermittent RVR-cardiology following-and adjusting beta-blockade dosage.    Currently not a candidate  for anticoagulation due to hemorrhagic transformation of CVA.   Dysphagia: Secondary to CVA-had NG tube in place-evaluated by SLP today-upgraded to dysphagia 2 diet-Per nursing staff-tolerating oral intake well-discontinue NG tube-consult nutrition for maximizing supplements.  Follow closely.  Appreciate SLP evaluation.    HTN: BP stable-continue metoprolol.  HLD: Continue statin  Hypokalemia: Repleted  Hypomagnesemia: Repleted  EtOH abuse: Per daughter-patient does not drink every day-apparently has gone several days without withdrawal symptoms when not drinking.  Out of window for withdrawal symptoms.  Diet: Diet Order            DIET DYS 2 Room service appropriate? Yes with Assist; Fluid consistency: Thin  Diet effective now                  Code Status: Full code   Family Communication: Daughter over the phone on 7/27  Disposition Plan: Status is: Inpatient  Remains inpatient appropriate because:Inpatient level of care appropriate due to severity of illness   Dispo: The patient is from: Home              Anticipated d/c is to: CIR              Anticipated d/c date is: 1 days              Patient currently is  medically stable to d/c.  Barriers to Discharge: Awaiting CIR bed-NG tube removed today-see how she does with oral intake over the next few days.  Antimicrobial agents: Anti-infectives (From admission, onward)   None       Time spent: 25 minutes-Greater than 50% of this time was spent in counseling, explanation of diagnosis, planning of  further management, and coordination of care.  MEDICATIONS: Scheduled Meds: . aspirin  300 mg Rectal Daily   Or  . aspirin  81 mg Per Tube Daily  . atorvastatin  40 mg Per Tube q1800  . chlorhexidine  15 mL Mouth Rinse BID  . enoxaparin (LOVENOX) injection  40 mg Subcutaneous Q24H  . mouth rinse  15 mL Mouth Rinse q12n4p  . metoprolol tartrate  50 mg Per Tube TID   Continuous Infusions: PRN Meds:.acetaminophen  **OR** acetaminophen (TYLENOL) oral liquid 160 mg/5 mL **OR** acetaminophen, Resource ThickenUp Clear   PHYSICAL EXAM: Vital signs: Vitals:   04/28/20 2003 04/28/20 2349 04/29/20 0400 04/29/20 0737  BP: 112/78 (!) 138/83 126/83 (!) 136/86  Pulse: 85 85 88 99  Resp: 18 21 16 15   Temp: 98.8 F (37.1 C) 98.7 F (37.1 C) 98.5 F (36.9 C) 97.8 F (36.6 C)  TempSrc: Oral Oral Oral Oral  SpO2: 97% 98% 97% 98%  Weight:   77.6 kg    Filed Weights   04/27/20 0440 04/28/20 0412 04/29/20 0400  Weight: 81.7 kg 80.4 kg 77.6 kg   Body mass index is 28.47 kg/m.   Gen Exam:Alert awake-not in any distress HEENT:atraumatic, normocephalic Chest: B/L clear to auscultation anteriorly CVS:S1S2 regular Abdomen:soft non tender, non distended Extremities:no edema Neurology: Right-sided hemiplegia-unchanged-dysarthria somewhat better. Skin: no rash  I have personally reviewed following labs and imaging studies  LABORATORY DATA: CBC: Recent Labs  Lab 04/27/20 0357 04/29/20 0125  WBC 5.3 4.5  HGB 12.3 11.7*  HCT 37.1 35.5*  MCV 89.2 88.8  PLT 213 233    Basic Metabolic Panel: Recent Labs  Lab 04/23/20 0443 04/24/20 1141 04/25/20 0920 04/27/20 0357 04/29/20 0125  NA 139 139 140 136 133*  K 3.3* 3.8 4.3 4.5 4.5  CL 103 102 106 101 98  CO2 21* 25 24 26 23   GLUCOSE 157* 145* 146* 146* 106*  BUN 16 20 18 18 17   CREATININE 0.83 0.64 0.64 0.62 0.54  CALCIUM 9.2 9.3 9.1 9.2 9.1  MG  --  1.7 2.0 1.8  --     GFR: CrCl cannot be calculated (Unknown ideal weight.).  Liver Function Tests: No results for input(s): AST, ALT, ALKPHOS, BILITOT, PROT, ALBUMIN in the last 168 hours. No results for input(s): LIPASE, AMYLASE in the last 168 hours. No results for input(s): AMMONIA in the last 168 hours.  Coagulation Profile: No results for input(s): INR, PROTIME in the last 168 hours.  Cardiac Enzymes: No results for input(s): CKTOTAL, CKMB, CKMBINDEX, TROPONINI in the last 168  hours.  BNP (last 3 results) No results for input(s): PROBNP in the last 8760 hours.  Lipid Profile: No results for input(s): CHOL, HDL, LDLCALC, TRIG, CHOLHDL, LDLDIRECT in the last 72 hours.  Thyroid Function Tests: No results for input(s): TSH, T4TOTAL, FREET4, T3FREE, THYROIDAB in the last 72 hours.  Anemia Panel: No results for input(s): VITAMINB12, FOLATE, FERRITIN, TIBC, IRON, RETICCTPCT in the last 72 hours.  Urine analysis:    Component Value Date/Time   COLORURINE YELLOW 04/21/2020 2000   APPEARANCEUR CLEAR 04/21/2020 2000   APPEARANCEUR Clear 08/17/2014 0948   LABSPEC 1.042 (H) 04/21/2020 2000   LABSPEC 1.018 08/17/2014 0948   PHURINE 6.0 04/21/2020 2000   GLUCOSEU NEGATIVE 04/21/2020 2000   GLUCOSEU Negative 08/17/2014 0948   HGBUR NEGATIVE 04/21/2020 2000   BILIRUBINUR NEGATIVE 04/21/2020 2000   BILIRUBINUR Negative 08/17/2014 0948   KETONESUR 20 (A) 04/21/2020 2000   PROTEINUR NEGATIVE 04/21/2020  2000   NITRITE NEGATIVE 04/21/2020 2000   LEUKOCYTESUR NEGATIVE 04/21/2020 2000   LEUKOCYTESUR Negative 08/17/2014 0948    Sepsis Labs: Lactic Acid, Venous No results found for: LATICACIDVEN  MICROBIOLOGY: Recent Results (from the past 240 hour(s))  SARS Coronavirus 2 by RT PCR (hospital order, performed in [facility] hospital lab) Nasopharyngeal Nasopharyngeal Swab     Status: None   Collection Time: 04/21/20  6:51 PM   Specimen: Nasopharyngeal Swab  Result Value Ref Range Status   SARS Coronavirus 2 NEGATIVE NEGATIVE Final    Comment: (NOTE) SARS-CoV-2 target nucleic acids are NOT DETECTED.  The SARS-CoV-2 RNA is generally detectable in upper and lower respiratory specimens during the acute phase of infection. The lowest concentration of SARS-CoV-2 viral copies this assay can detect is 250 copies / mL. A negative result does not preclude SARS-CoV-2 infection and should not be used as the sole basis for treatment or other patient management decisions.  A  negative result may occur with improper specimen collection / handling, submission of specimen other than nasopharyngeal swab, presence of viral mutation(s) within the areas targeted by this assay, and inadequate number of viral copies (<250 copies / mL). A negative result must be combined with clinical observations, patient history, and epidemiological information.  Fact Sheet for Patients:   BoilerBrush.com.cy  Fact Sheet for Healthcare Providers: https://pope.com/  This test is not yet approved or  cleared by the [place] FDA and has been authorized for detection and/or diagnosis of SARS-CoV-2 by FDA under an Emergency Use Authorization (EUA).  This EUA will remain in effect (meaning this test can be used) for the duration of the COVID-19 declaration under Section 564(b)(1) of the Act, 21 U.S.C. section 360bbb-3(b)(1), unless the authorization is terminated or revoked sooner.  Performed at [facility], [address], [place], [place] [address]   Culture, blood (Routine X 2) w Reflex to ID Panel     Status: None   Collection Time: 04/21/20  8:25 PM   Specimen: BLOOD RIGHT HAND  Result Value Ref Range Status   Specimen Description BLOOD RIGHT HAND  Final   Special Requests   Final    BOTTLES DRAWN AEROBIC AND ANAEROBIC Blood Culture adequate volume   Culture   Final    NO GROWTH 5 DAYS Performed at [facility], [address] [place], [place] [address]    Report Status 04/26/2020 FINAL  Final  MRSA PCR Screening     Status: None   Collection Time: 04/22/20  1:17 AM   Specimen: Nasal Mucosa; Nasopharyngeal  Result Value Ref Range Status   MRSA by PCR NEGATIVE NEGATIVE Final    Comment:        The GeneXpert MRSA Assay (FDA approved for NASAL specimens only), is one component of a comprehensive MRSA colonization surveillance program. It is not intended to diagnose MRSA infection nor to guide or monitor  treatment for MRSA infections. Performed at [facility], [address], [place], [place] [address]     RADIOLOGY STUDIES/RESULTS: DG Swallowing Func-Speech Pathology  Result Date: 04/28/2020 Objective Swallowing Evaluation: Type of Study: MBS-Modified Barium Swallow Study  Patient Details Name: Lindsey [name] MRN: [id number] Date of Birth: 1956-01-26 Today's Date: 04/28/2020 Time: SLP Start Time (ACUTE ONLY): 1310 -SLP Stop Time (ACUTE ONLY): 1340 SLP Time Calculation (min) (ACUTE ONLY): 30 min Past Medical History: Past Medical History: Diagnosis Date . Diverticulosis  . Hypertension  . Uterine prolapse  Past Surgical History:  No past surgical history on file. HPI: Pt is a 64 y.o. female admitted 04/21/20 with R weakness, facial droop, and severe dysarthria. CT and MRI brain reveal large L MCA, initially ischemic but with some hemorrhagic conversion and involving left frontal lobe, insula as well as the caudate and putamen. CT scan 7/23 shows evolving large L MCA infarct with stable hemorrhagic transformation but increasing edema and 3 mm left-to-right shift. PMH includes HTN, afib with RVR.  Subjective: alert Assessment / Plan / Recommendation CHL IP CLINICAL IMPRESSIONS 04/28/2020 Clinical Impression Pt presents with a primary moderate oral dysphagia marked by decreased sensation on right side of buccal cavity and decreased bolus cohesion/manipulation.  These deficits led to moderate residue in the right side of mouth without pt's awareness.  Pharyngeally, pt's swalllow was quite functional.  The pharyngeal swallow was initiated between the valleculae and pyriforms, there was reliable laryngeal vestibule closure for all consistencies (high penetration x 1 with mixed liquid/solid consistency, PAS score #2), and no aspiration.  There was good pharyngeal squeeze and no residue in the pharynx post-swallow.  Recommend advancing diet to dysphagia 2, thin liquids.  Given sensory deficits, pt will require full  supervision to ensure safety and attention to left side of mouth during meals.  Give meds whole in puree.  SLP will continue to follow.  SLP Visit Diagnosis Dysphagia, oral phase (R13.11) Attention and concentration deficit following -- Frontal lobe and executive function deficit following -- Impact on safety and function --   CHL IP TREATMENT RECOMMENDATION 04/28/2020 Treatment Recommendations Defer treatment plan to f/u with SLP   Prognosis 04/28/2020 Prognosis for Safe Diet Advancement Good Barriers to Reach Goals -- Barriers/Prognosis Comment -- CHL IP DIET RECOMMENDATION 04/28/2020 SLP Diet Recommendations Dysphagia 2 (Fine chop) solids;Thin liquid Liquid Administration via Cup;Straw Medication Administration Whole meds with puree Compensations Minimize environmental distractions;Slow rate;Small sips/bites;Monitor for anterior loss Postural Changes --   CHL IP OTHER RECOMMENDATIONS 04/28/2020 Recommended Consults -- Oral Care Recommendations Oral care BID Other Recommendations --   CHL IP FOLLOW UP RECOMMENDATIONS 04/28/2020 Follow up Recommendations Inpatient Rehab   CHL IP FREQUENCY AND DURATION 04/28/2020 Speech Therapy Frequency (ACUTE ONLY) min 2x/week Treatment Duration 2 weeks      CHL IP ORAL PHASE 04/28/2020 Oral Phase Impaired Oral - Pudding Teaspoon -- Oral - Pudding Cup -- Oral - Honey Teaspoon -- Oral - Honey Cup -- Oral - Nectar Teaspoon -- Oral - Nectar Cup -- Oral - Nectar Straw -- Oral - Thin Teaspoon -- Oral - Thin Cup -- Oral - Thin Straw Right anterior bolus loss;Reduced posterior propulsion;Decreased bolus cohesion Oral - Puree Right pocketing in lateral sulci;Lingual/palatal residue;Delayed oral transit;Decreased bolus cohesion Oral - Mech Soft -- Oral - Regular Right pocketing in lateral sulci;Lingual/palatal residue;Delayed oral transit;Decreased bolus cohesion Oral - Multi-Consistency -- Oral - Pill -- Oral Phase - Comment --  CHL IP PHARYNGEAL PHASE 04/28/2020 Pharyngeal Phase WFL Pharyngeal-  Pudding Teaspoon -- Pharyngeal -- Pharyngeal- Pudding Cup -- Pharyngeal -- Pharyngeal- Honey Teaspoon -- Pharyngeal -- Pharyngeal- Honey Cup -- Pharyngeal -- Pharyngeal- Nectar Teaspoon -- Pharyngeal -- Pharyngeal- Nectar Cup -- Pharyngeal -- Pharyngeal- Nectar Straw -- Pharyngeal -- Pharyngeal- Thin Teaspoon -- Pharyngeal -- Pharyngeal- Thin Cup -- Pharyngeal -- Pharyngeal- Thin Straw -- Pharyngeal -- Pharyngeal- Puree -- Pharyngeal -- Pharyngeal- Mechanical Soft -- Pharyngeal -- Pharyngeal- Regular -- Pharyngeal -- Pharyngeal- Multi-consistency -- Pharyngeal -- Pharyngeal- Pill -- Pharyngeal -- Pharyngeal Comment --  No flowsheet data found. Blenda Mounts Laurice 04/28/2020, 2:11  PM                LOS: 8 days   Jeoffrey Massed, MD  [facility]    To contact the attending provider between 7A-7P or the covering provider during after hours 7P-7A, please log into the web site [email or web address] and access using universal [place] password for that web site. If you do not have the password, please call the hospital operator.  04/29/2020, 10:01 AM

## 2020-04-29 NOTE — Progress Notes (Signed)
Progress Note  Patient Name: Lindsey [name] Date of Encounter: 04/29/2020  [facility] Cardiologist: No primary care provider on file. [employer])  Subjective   Feeling better. Feeding tube removed. No dyspnea/angina. Arrhythmia unaware. Ventricular rates in 80s at night 90-low 100s during day.  Inpatient Medications    Scheduled Meds: . aspirin  300 mg Rectal Daily   Or  . aspirin  81 mg Per Tube Daily  . atorvastatin  40 mg Per Tube q1800  . chlorhexidine  15 mL Mouth Rinse BID  . enoxaparin (LOVENOX) injection  40 mg Subcutaneous Q24H  . mouth rinse  15 mL Mouth Rinse q12n4p  . metoprolol tartrate  50 mg Per Tube TID   Continuous Infusions:  PRN Meds: acetaminophen **OR** acetaminophen (TYLENOL) oral liquid 160 mg/5 mL **OR** acetaminophen, Resource ThickenUp Clear   Vital Signs    Vitals:   04/28/20 2003 04/28/20 2349 04/29/20 0400 04/29/20 0737  BP: 112/78 (!) 138/83 126/83 (!) 136/86  Pulse: 85 85 88 99  Resp: 18 21 16 15   Temp: 98.8 F (37.1 C) 98.7 F (37.1 C) 98.5 F (36.9 C) 97.8 F (36.6 C)  TempSrc: Oral Oral Oral Oral  SpO2: 97% 98% 97% 98%  Weight:   77.6 kg     Intake/Output Summary (Last 24 hours) at 04/29/2020 1007 Last data filed at 04/29/2020 05/01/2020 Gross per 24 hour  Intake --  Output 402 ml  Net -402 ml   Last 3 Weights 04/29/2020 04/28/2020 04/27/2020  Weight (lbs) 171 lb 1.2 oz 177 lb 4 oz 180 lb 1.9 oz  Weight (kg) 77.6 kg 80.4 kg 81.7 kg      Telemetry    AF, rate controlled - Personally Reviewed  ECG    No new tracing - Personally Reviewed  Physical Exam  Better spirits GEN: No acute distress.   Neck: No JVD Cardiac: irregular, no murmurs, rubs, or gallops.  Respiratory: Clear to auscultation bilaterally. GI: Soft, nontender, non-distended  MS: No edema; No deformity. Neuro:  Nonfocal  Psych: Normal affect   Labs    High Sensitivity Troponin:  No results for input(s): TROPONINIHS in the last 720 hours.     Chemistry Recent Labs  Lab 04/25/20 0920 04/27/20 0357 04/29/20 0125  NA 140 136 133*  K 4.3 4.5 4.5  CL 106 101 98  CO2 24 26 23   GLUCOSE 146* 146* 106*  BUN 18 18 17   CREATININE 0.64 0.62 0.54  CALCIUM 9.1 9.2 9.1  GFRNONAA >60 >60 >60  GFRAA >60 >60 >60  ANIONGAP 10 9 12      Hematology Recent Labs  Lab 04/27/20 0357 04/29/20 0125  WBC 5.3 4.5  RBC 4.16 4.00  HGB 12.3 11.7*  HCT 37.1 35.5*  MCV 89.2 88.8  MCH 29.6 29.3  MCHC 33.2 33.0  RDW 14.6 14.2  PLT 213 233    BNPNo results for input(s): BNP, PROBNP in the last 168 hours.   DDimer No results for input(s): DDIMER in the last 168 hours.   Radiology    DG Swallowing Func-Speech Pathology  Result Date: 04/28/2020 Objective Swallowing Evaluation: Type of Study: MBS-Modified Barium Swallow Study  Patient Details Name: Lindsey [name] MRN: [id number] Date of Birth: June 02, 1956 Today's Date: 04/28/2020 Time: SLP Start Time (ACUTE ONLY): 1310 -SLP Stop Time (ACUTE ONLY): 1340 SLP Time Calculation (min) (ACUTE ONLY): 30 min Past Medical History: Past Medical History: Diagnosis Date . Diverticulosis  . Hypertension  . Uterine prolapse  Past Surgical History:  No past surgical history on file. HPI: Pt is a 64 y.o. female admitted 04/21/20 with R weakness, facial droop, and severe dysarthria. CT and MRI brain reveal large L MCA, initially ischemic but with some hemorrhagic conversion and involving left frontal lobe, insula as well as the caudate and putamen. CT scan 7/23 shows evolving large L MCA infarct with stable hemorrhagic transformation but increasing edema and 3 mm left-to-right shift. PMH includes HTN, afib with RVR.  Subjective: alert Assessment / Plan / Recommendation CHL IP CLINICAL IMPRESSIONS 04/28/2020 Clinical Impression Pt presents with a primary moderate oral dysphagia marked by decreased sensation on right side of buccal cavity and decreased bolus cohesion/manipulation.  These deficits led to moderate residue in the  right side of mouth without pt's awareness.  Pharyngeally, pt's swalllow was quite functional.  The pharyngeal swallow was initiated between the valleculae and pyriforms, there was reliable laryngeal vestibule closure for all consistencies (high penetration x 1 with mixed liquid/solid consistency, PAS score #2), and no aspiration.  There was good pharyngeal squeeze and no residue in the pharynx post-swallow.  Recommend advancing diet to dysphagia 2, thin liquids.  Given sensory deficits, pt will require full supervision to ensure safety and attention to left side of mouth during meals.  Give meds whole in puree.  SLP will continue to follow.  SLP Visit Diagnosis Dysphagia, oral phase (R13.11) Attention and concentration deficit following -- Frontal lobe and executive function deficit following -- Impact on safety and function --   CHL IP TREATMENT RECOMMENDATION 04/28/2020 Treatment Recommendations Defer treatment plan to f/u with SLP   Prognosis 04/28/2020 Prognosis for Safe Diet Advancement Good Barriers to Reach Goals -- Barriers/Prognosis Comment -- CHL IP DIET RECOMMENDATION 04/28/2020 SLP Diet Recommendations Dysphagia 2 (Fine chop) solids;Thin liquid Liquid Administration via Cup;Straw Medication Administration Whole meds with puree Compensations Minimize environmental distractions;Slow rate;Small sips/bites;Monitor for anterior loss Postural Changes --   CHL IP OTHER RECOMMENDATIONS 04/28/2020 Recommended Consults -- Oral Care Recommendations Oral care BID Other Recommendations --   CHL IP FOLLOW UP RECOMMENDATIONS 04/28/2020 Follow up Recommendations Inpatient Rehab   CHL IP FREQUENCY AND DURATION 04/28/2020 Speech Therapy Frequency (ACUTE ONLY) min 2x/week Treatment Duration 2 weeks      CHL IP ORAL PHASE 04/28/2020 Oral Phase Impaired Oral - Pudding Teaspoon -- Oral - Pudding Cup -- Oral - Honey Teaspoon -- Oral - Honey Cup -- Oral - Nectar Teaspoon -- Oral - Nectar Cup -- Oral - Nectar Straw -- Oral - Thin  Teaspoon -- Oral - Thin Cup -- Oral - Thin Straw Right anterior bolus loss;Reduced posterior propulsion;Decreased bolus cohesion Oral - Puree Right pocketing in lateral sulci;Lingual/palatal residue;Delayed oral transit;Decreased bolus cohesion Oral - Mech Soft -- Oral - Regular Right pocketing in lateral sulci;Lingual/palatal residue;Delayed oral transit;Decreased bolus cohesion Oral - Multi-Consistency -- Oral - Pill -- Oral Phase - Comment --  CHL IP PHARYNGEAL PHASE 04/28/2020 Pharyngeal Phase WFL Pharyngeal- Pudding Teaspoon -- Pharyngeal -- Pharyngeal- Pudding Cup -- Pharyngeal -- Pharyngeal- Honey Teaspoon -- Pharyngeal -- Pharyngeal- Honey Cup -- Pharyngeal -- Pharyngeal- Nectar Teaspoon -- Pharyngeal -- Pharyngeal- Nectar Cup -- Pharyngeal -- Pharyngeal- Nectar Straw -- Pharyngeal -- Pharyngeal- Thin Teaspoon -- Pharyngeal -- Pharyngeal- Thin Cup -- Pharyngeal -- Pharyngeal- Thin Straw -- Pharyngeal -- Pharyngeal- Puree -- Pharyngeal -- Pharyngeal- Mechanical Soft -- Pharyngeal -- Pharyngeal- Regular -- Pharyngeal -- Pharyngeal- Multi-consistency -- Pharyngeal -- Pharyngeal- Pill -- Pharyngeal -- Pharyngeal Comment --  No flowsheet data found. Lindsey [name] 04/28/2020, 2:11  PM               Cardiac Studies   ECHO 04/22/2020  1. Left ventricular ejection fraction, by estimation, is 60 to 65%. The  left ventricle has normal function. The left ventricle has no regional  wall motion abnormalities. There is mild left ventricular hypertrophy.  Left ventricular diastolic parameters  are indeterminate.  2. Right ventricular systolic function is normal. The right ventricular  size is normal. There is normal pulmonary artery systolic pressure. The  estimated right ventricular systolic pressure is 27.6 mmHg.  3. Left atrial size was mild to moderately dilated.  4. Right atrial size was mildly dilated.  5. The mitral valve is normal in structure. No evidence of mitral valve  regurgitation.  No evidence of mitral stenosis.  6. The aortic valve is tricuspid. Aortic valve regurgitation is not  visualized. No aortic stenosis is present.  7. The inferior vena cava is normal in size with greater than 50%  respiratory variability, suggesting right atrial pressure of 3 mmHg.   Patient Profile     64 y.o. 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  Assessment & Plan     [facility] will sign off.   Medication Recommendations:  Continue metoprolol in current dose (will change to 75 mg every 12 h for convenience of dosing and compliance) Continue ASA and statin. Stop ASA when starting direct oral anticoagulant (timing per Neurology/Stroke service) Other recommendations (labs, testing, etc):  n/a Follow up as an outpatient:  Please let us know when she is approaching DC.  For questions or updates, please contact [facility] Please consult [email or web address] for contact info under        Signed, Thurmon Fair, MD  04/29/2020, 10:07 AM

## 2020-04-30 ENCOUNTER — Other Ambulatory Visit: Payer: Self-pay

## 2020-04-30 ENCOUNTER — Inpatient Hospital Stay (HOSPITAL_COMMUNITY)
Admission: RE | Admit: 2020-04-30 | Discharge: 2020-05-29 | DRG: 057 | Disposition: A | Discharge status: Home Health | Payer: Medicaid Other | Source: Intra-hospital | Attending: Physical Medicine & Rehabilitation | Admitting: Physical Medicine & Rehabilitation

## 2020-04-30 ENCOUNTER — Encounter (HOSPITAL_COMMUNITY): Payer: Self-pay | Admitting: Physical Medicine & Rehabilitation

## 2020-04-30 DIAGNOSIS — R001 Bradycardia, unspecified: Secondary | ICD-10-CM | POA: Diagnosis not present

## 2020-04-30 DIAGNOSIS — I69391 Dysphagia following cerebral infarction: Secondary | ICD-10-CM | POA: Diagnosis not present

## 2020-04-30 DIAGNOSIS — Z87891 Personal history of nicotine dependence: Secondary | ICD-10-CM | POA: Diagnosis not present

## 2020-04-30 DIAGNOSIS — I4819 Other persistent atrial fibrillation: Secondary | ICD-10-CM | POA: Diagnosis not present

## 2020-04-30 DIAGNOSIS — I63512 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 middle cerebral artery: Secondary | ICD-10-CM | POA: Diagnosis present

## 2020-04-30 DIAGNOSIS — R7303 Prediabetes: Secondary | ICD-10-CM | POA: Diagnosis present

## 2020-04-30 DIAGNOSIS — I69322 Dysarthria following cerebral infarction: Secondary | ICD-10-CM

## 2020-04-30 DIAGNOSIS — G4700 Insomnia, unspecified: Secondary | ICD-10-CM | POA: Diagnosis present

## 2020-04-30 DIAGNOSIS — I69351 Hemiplegia and hemiparesis following cerebral infarction affecting right dominant side: Principal | ICD-10-CM

## 2020-04-30 DIAGNOSIS — R000 Tachycardia, unspecified: Secondary | ICD-10-CM | POA: Diagnosis not present

## 2020-04-30 DIAGNOSIS — E162 Hypoglycemia, unspecified: Secondary | ICD-10-CM | POA: Diagnosis not present

## 2020-04-30 DIAGNOSIS — I69392 Facial weakness following cerebral infarction: Secondary | ICD-10-CM

## 2020-04-30 DIAGNOSIS — F1290 Cannabis use, unspecified, uncomplicated: Secondary | ICD-10-CM | POA: Diagnosis present

## 2020-04-30 DIAGNOSIS — I10 Essential (primary) hypertension: Secondary | ICD-10-CM | POA: Diagnosis not present

## 2020-04-30 DIAGNOSIS — Z5329 Procedure and treatment not carried out because of patient's decision for other reasons: Secondary | ICD-10-CM | POA: Diagnosis not present

## 2020-04-30 DIAGNOSIS — G8191 Hemiplegia, unspecified affecting right dominant side: Secondary | ICD-10-CM | POA: Diagnosis not present

## 2020-04-30 DIAGNOSIS — D649 Anemia, unspecified: Secondary | ICD-10-CM

## 2020-04-30 DIAGNOSIS — I69312 Visuospatial deficit and spatial neglect following cerebral infarction: Secondary | ICD-10-CM | POA: Diagnosis not present

## 2020-04-30 DIAGNOSIS — I4891 Unspecified atrial fibrillation: Secondary | ICD-10-CM | POA: Diagnosis present

## 2020-04-30 DIAGNOSIS — R1312 Dysphagia, oropharyngeal phase: Secondary | ICD-10-CM | POA: Diagnosis present

## 2020-04-30 DIAGNOSIS — Z79899 Other long term (current) drug therapy: Secondary | ICD-10-CM | POA: Diagnosis not present

## 2020-04-30 DIAGNOSIS — I69320 Aphasia following cerebral infarction: Secondary | ICD-10-CM

## 2020-04-30 DIAGNOSIS — D62 Acute posthemorrhagic anemia: Secondary | ICD-10-CM | POA: Diagnosis present

## 2020-04-30 DIAGNOSIS — F1010 Alcohol abuse, uncomplicated: Secondary | ICD-10-CM | POA: Diagnosis present

## 2020-04-30 DIAGNOSIS — Z7982 Long term (current) use of aspirin: Secondary | ICD-10-CM | POA: Diagnosis not present

## 2020-04-30 LAB — GLUCOSE, CAPILLARY
Glucose-Capillary: 109 mg/dL — ABNORMAL HIGH (ref 70–99)
Glucose-Capillary: 111 mg/dL — ABNORMAL HIGH (ref 70–99)
Glucose-Capillary: 113 mg/dL — ABNORMAL HIGH (ref 70–99)
Glucose-Capillary: 136 mg/dL — ABNORMAL HIGH (ref 70–99)
Glucose-Capillary: 149 mg/dL — ABNORMAL HIGH (ref 70–99)
Glucose-Capillary: 98 mg/dL (ref 70–99)

## 2020-04-30 MED ORDER — ASPIRIN 81 MG PO CHEW: 81.0000 mg | CHEWABLE_TABLET | Freq: Every day | ORAL | Status: DC

## 2020-04-30 MED ORDER — ASPIRIN 300 MG RE SUPP: 300.0000 mg | Freq: Every day | RECTAL | Status: DC

## 2020-04-30 MED ORDER — INSULIN ASPART 100 UNIT/ML ~~LOC~~ SOLN
0.0000 [IU] | Freq: Three times a day (TID) | SUBCUTANEOUS | Status: DC
  Administered 2020-05-03: 1 [IU] via SUBCUTANEOUS

## 2020-04-30 MED ORDER — ENOXAPARIN SODIUM 40 MG/0.4ML ~~LOC~~ SOLN
40.0000 mg | SUBCUTANEOUS | Status: DC
  Administered 2020-05-01 – 2020-05-12 (×12): 40 mg via SUBCUTANEOUS
  Filled 2020-04-30 (×13): qty 0.4

## 2020-04-30 MED ORDER — ATORVASTATIN CALCIUM 40 MG PO TABS
40.0000 mg | ORAL_TABLET | Freq: Every day | ORAL | Status: DC
  Administered 2020-04-30 – 2020-05-28 (×28): 40 mg via ORAL
  Filled 2020-04-30 (×32): qty 1

## 2020-04-30 MED ORDER — ALUM & MAG HYDROXIDE-SIMETH 200-200-20 MG/5ML PO SUSP: 30.0000 mL | ORAL | Status: DC | PRN

## 2020-04-30 MED ORDER — GUAIFENESIN-DM 100-10 MG/5ML PO SYRP: 5.0000 mL | ORAL_SOLUTION | Freq: Four times a day (QID) | ORAL | Status: DC | PRN

## 2020-04-30 MED ORDER — ORAL CARE MOUTH RINSE
15.0000 mL | Freq: Two times a day (BID) | OROMUCOSAL | Status: DC
  Administered 2020-04-30 – 2020-05-25 (×22): 15 mL via OROMUCOSAL

## 2020-04-30 MED ORDER — ATORVASTATIN CALCIUM 40 MG PO TABS: 40.0000 mg | ORAL_TABLET | Freq: Every day | ORAL | Status: DC

## 2020-04-30 MED ORDER — PROCHLORPERAZINE EDISYLATE 10 MG/2ML IJ SOLN: 5.0000 mg | Freq: Four times a day (QID) | INTRAMUSCULAR | Status: DC | PRN

## 2020-04-30 MED ORDER — DIPHENHYDRAMINE HCL 12.5 MG/5ML PO ELIX: 12.5000 mg | ORAL_SOLUTION | Freq: Four times a day (QID) | ORAL | Status: DC | PRN

## 2020-04-30 MED ORDER — ASPIRIN 81 MG PO CHEW
81.0000 mg | CHEWABLE_TABLET | Freq: Every day | ORAL | Status: DC
  Administered 2020-05-01 – 2020-05-12 (×12): 81 mg via ORAL
  Filled 2020-04-30 (×13): qty 1

## 2020-04-30 MED ORDER — METOPROLOL TARTRATE 50 MG PO TABS
75.0000 mg | ORAL_TABLET | Freq: Two times a day (BID) | ORAL | Status: DC
  Administered 2020-04-30 – 2020-05-25 (×41): 75 mg via ORAL
  Filled 2020-04-30 (×50): qty 1

## 2020-04-30 MED ORDER — PROCHLORPERAZINE 25 MG RE SUPP: 12.5000 mg | Freq: Four times a day (QID) | RECTAL | Status: DC | PRN

## 2020-04-30 MED ORDER — INSULIN ASPART 100 UNIT/ML ~~LOC~~ SOLN: 0.0000 [IU] | Freq: Every day | SUBCUTANEOUS | Status: DC

## 2020-04-30 MED ORDER — ASPIRIN 300 MG RE SUPP
300.0000 mg | Freq: Every day | RECTAL | Status: DC
  Filled 2020-04-30: qty 1

## 2020-04-30 MED ORDER — ENSURE ENLIVE PO LIQD: 237.0000 mL | Freq: Three times a day (TID) | ORAL | 12 refills | Status: DC

## 2020-04-30 MED ORDER — METOPROLOL TARTRATE 75 MG PO TABS: 75.0000 mg | ORAL_TABLET | Freq: Two times a day (BID) | ORAL | Status: DC

## 2020-04-30 MED ORDER — ACETAMINOPHEN 325 MG PO TABS
325.0000 mg | ORAL_TABLET | ORAL | Status: DC | PRN
  Administered 2020-05-14 – 2020-05-15 (×2): 650 mg via ORAL
  Filled 2020-04-30 (×3): qty 2

## 2020-04-30 MED ORDER — BISACODYL 10 MG RE SUPP: 10.0000 mg | Freq: Every day | RECTAL | Status: DC | PRN

## 2020-04-30 MED ORDER — POLYETHYLENE GLYCOL 3350 17 G PO PACK
17.0000 g | PACK | Freq: Every day | ORAL | Status: DC | PRN
  Administered 2020-05-23 – 2020-05-27 (×2): 17 g via ORAL
  Filled 2020-04-30 (×2): qty 1

## 2020-04-30 MED ORDER — ENSURE ENLIVE PO LIQD
237.0000 mL | Freq: Three times a day (TID) | ORAL | Status: DC
  Administered 2020-04-30 – 2020-05-23 (×23): 237 mL via ORAL

## 2020-04-30 MED ORDER — FLEET ENEMA 7-19 GM/118ML RE ENEM: 1.0000 | ENEMA | Freq: Once | RECTAL | Status: DC | PRN

## 2020-04-30 MED ORDER — CHLORHEXIDINE GLUCONATE 0.12 % MT SOLN
15.0000 mL | Freq: Two times a day (BID) | OROMUCOSAL | Status: DC
  Administered 2020-04-30 – 2020-05-28 (×44): 15 mL via OROMUCOSAL
  Filled 2020-04-30 (×53): qty 15

## 2020-04-30 MED ORDER — PROCHLORPERAZINE MALEATE 5 MG PO TABS: 5.0000 mg | ORAL_TABLET | Freq: Four times a day (QID) | ORAL | Status: DC | PRN

## 2020-04-30 MED ORDER — MELATONIN 3 MG PO TABS
3.0000 mg | ORAL_TABLET | Freq: Every evening | ORAL | Status: DC | PRN
  Administered 2020-04-30 – 2020-05-09 (×2): 3 mg via ORAL
  Filled 2020-04-30 (×2): qty 1

## 2020-04-30 NOTE — Progress Notes (Signed)
Standley Brooking, RN  Rehab Admission Coordinator  Physical Medicine and Rehabilitation  PMR Pre-admission     Signed  Date of Service:  04/30/2020 11:12 AM      Related encounter: ED to Hosp-Admission (Current) from 04/21/2020 in [place] [facility]      Signed       Show:Clear all [x] Manual[x] Template[] Copied  Added by: [x]  , RN  [] Hover for details PMR Admission Coordinator Pre-Admission Assessment   Patient: Lindsey [name] is an 64 y.o., female MRN: DOB: Feb 12, 1956 Height:   Weight: 77.9 kg                                                                                                                          Insurance Information   PRIMARY: uninsured      77 has applied for [id number] and Disability with [place] [facility] with assistance of 03/29/1956:       Phone#:    [facility] [name]" for patients in Inpatient Rehabilitation Facilities with attached "Privacy Act Statement-Health Care Records" was provided and verbally reviewed with: N/A   Emergency Contact Information         Contact Information     Name Relation Home Work Mobile    [place] Daughter [phone]   [phone]       Current Medical History  Patient Admitting Diagnosis: CVA   History of Present Illness: 64 year old female with medical history for hypertension and alcohol use. Presented on 04/21/2020 with concerns of right sided weakness and slurred speech.Patient dysarthric/aphasic with left sided gaze and right sided hemiparesis. Patient in atrial fibrillation with RVR.    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 There was also noted acute occlusion to th left M2 on CTA head and neck . Neurology consulted. On 7/24 repeat CT head with evolving left MCA infarct with increasing edema  and increased rightward shift, small hemorrhages within the infarct without significant progression. Echo EF 60 to 65%.    Neurology recommended to continue ASA. Dysphagia initially with Cortrak, evaluated by SLP and upgraded to Dysphagia 2 diet. Dietician consulted and recommending supplements.    Afib with RVR and continues to have intermittent RVR. Cardiology consulted and adjusted beta blockade dosage. Currently not a candidate for anticoagulation due to hemorrhagic transformation of CVA. HTN with BP stable and to continue metoprolol. Continue statin.   Stroke MD on call 05/08/2020 to reassess whether a repeat CT head is needed before considering initiation of anticoagulation.    Complete NIHSS TOTAL: 20 Glasgow Coma Scale Score: 15   Past Medical History      Past Medical History:  Diagnosis Date  . Diverticulosis    . Hypertension    . Uterine prolapse        Family History  family history is not on file.   Prior Rehab/Hospitalizations:  Has the patient had prior rehab or hospitalizations prior to admission? Yes   Has the patient had major surgery during 100 days prior to admission? No   Current Medications    Current Facility-Administered Medications:  .  acetaminophen (TYLENOL) tablet 650 mg, 650 mg, Oral, Q4H PRN, 650 mg at 04/30/20 1014 **OR** acetaminophen (TYLENOL) 160 MG/5ML solution 650 mg, 650 mg, Per Tube, Q4H PRN **OR** acetaminophen (TYLENOL) suppository 650 mg, 650 mg, Rectal, Q4H PRN, Tu, Ching T, DO .  aspirin suppository 300 mg, 300 mg, Rectal, Daily, 300 mg at 04/22/20 1021 **OR** aspirin chewable tablet 81 mg, 81 mg, Per Tube, Daily, Biby, Sharon L, NP, 81 mg at 04/30/20 0912 .  atorvastatin (LIPITOR) tablet 40 mg, 40 mg, Per Tube, q1800, Maretta Bees, MD, 40 mg at 04/29/20 1816 .  chlorhexidine (PERIDEX) 0.12 % solution 15 mL, 15 mL, Mouth Rinse, BID, Ghimire, Werner Lean, MD, 15 mL at 04/30/20 0913 .  enoxaparin (LOVENOX) injection 40 mg, 40 mg,  Subcutaneous, Q24H, Biby, Sharon L, NP, 40 mg at 04/30/20 1014 .  feeding supplement (ENSURE ENLIVE) (ENSURE ENLIVE) liquid 237 mL, 237 mL, Oral, TID BM, Ghimire, Werner Lean, MD, 237 mL at 04/30/20 1014 .  MEDLINE mouth rinse, 15 mL, Mouth Rinse, q12n4p, Ghimire, Werner Lean, MD, 15 mL at 04/28/20 1504 .  metoprolol tartrate (LOPRESSOR) tablet 75 mg, 75 mg, Oral, BID, Croitoru, Mihai, MD, 75 mg at 04/30/20 0912 .  Resource ThickenUp Clear, , Oral, PRN, Ghimire, Werner Lean, MD   Patients Current Diet:     Diet Order                      Diet - low sodium heart healthy              DIET DYS 2 Room service appropriate? Yes with Assist; Fluid consistency: Thin  Diet effective now                    Precautions / Restrictions Precautions Precautions: Fall Precaution Comments: R neglect, R hemi Restrictions Weight Bearing Restrictions: No    Has the patient had 2 or more falls or a fall with injury in the past year?No   Prior Activity Level Community (5-7x/wk): independent   Prior Functional Level Prior Function Level of Independence: Independent Comments: driving   Self Care: Did the patient need help bathing, dressing, using the toilet or eating?  Independent   Indoor Mobility: Did the patient need assistance with walking from room to room (with or without device)? Independent   Stairs: Did the patient need assistance with internal or external stairs (with or without device)? Independent   Functional Cognition: Did the patient need help planning regular tasks such as shopping or remembering to take medications? Independent   Home Assistive Devices / Equipment Home Equipment: None   Prior Device Use: Indicate devices/aids used by the patient prior to current illness, exacerbation or injury? None of the above   Current Functional Level Cognition   Arousal/Alertness: Awake/alert Overall Cognitive Status: Difficult to assess Difficult to assess due to: Impaired  communication Current Attention Level: Sustained Orientation Level: Oriented to person, Oriented to place, Disoriented to time, Disoriented to situation, Other (comment) (pt has a hard time communicating) Following Commands: Follows one step commands inconsistently, Follows one step commands with increased time Safety/Judgement: Decreased awareness of safety, Decreased awareness of deficits General Comments: Requires  verbal and tactile cues for attention to R side and initiating tasks.  Motor planning skills are improving, able to make a thumbs up with L hand this session.   Memory:  (did not test due to pt's language deficits)    Extremity Assessment (includes Sensation/Coordination)   Upper Extremity Assessment: RUE deficits/detail, LUE deficits/detail RUE Deficits / Details: Flaccid, though sensation is improving.   RUE Sensation: decreased light touch, decreased proprioception RUE Coordination: decreased fine motor, decreased gross motor LUE Coordination: decreased fine motor  Lower Extremity Assessment: Defer to PT evaluation RLE Deficits / Details: mild hypertonicty in hip/knee flexion, no active muscle contraction appreciated. + withdrawal to painful stimuli RLE Sensation: decreased light touch     ADLs   Overall ADL's : Needs assistance/impaired Grooming: Wash/dry face, Set up, Sitting Grooming Details (indicate cue type and reason): Using L hand, sitting EOB. Sitting required min-max support  Toileting- Clothing Manipulation and Hygiene: Total assistance, Sit to/from stand Toileting - Clothing Manipulation Details (indicate cue type and reason): Stood using stedy and support from PT. Functional mobility during ADLs: Moderate assistance, Maximal assistance, +2 for physical assistance General ADL Comments: Session focused on scanning, increasing R side attention, and ROM exercises.  Sitting EOB and seated balance to prepare for seated ADLs     Mobility   Overal bed mobility: Needs  Assistance Bed Mobility: Supine to Sit, Sit to Supine Rolling: Mod assist, Supervision (mod to left; supervision to right) Sidelying to sit: Mod assist (up from rt side due to room setup) Supine to sit: HOB elevated, Max assist Sit to supine: Mod assist, HOB elevated General bed mobility comments: Patient requiring cues for sequencing and moving L side.  She was able to scoot self around and pull up to sit with l arm and help of therapist.     Transfers   Overall transfer level: Needs assistance Equipment used: Ambulation equipment used Transfer via Lift Equipment: Stedy Transfers: Sit to/from Stand Sit to Stand: Min assist, +2 physical assistance, From elevated surface, +2 safety/equipment General transfer comment: Min +2 for power up, steadying, and correcting heavy R lateral leaning. STS x1 from EOB, and x3 from stedy; x721from recliner. Patient incontinent of urine in standing requiring incr time for clean up/clean linends     Ambulation / Gait / Stairs / Wheelchair Mobility   Ambulation/Gait General Gait Details: unable     Posture / Balance Dynamic Sitting Balance Sitting balance - Comments: Ranging min guard- min assist to maintain balance. Facilitating weight bearing through R UE. Balance Overall balance assessment: Needs assistance Sitting-balance support: Feet supported, Single extremity supported Sitting balance-Leahy Scale: Poor Sitting balance - Comments: Ranging min guard- min assist to maintain balance. Facilitating weight bearing through R UE. Postural control: Right lateral lean Standing balance support: Single extremity supported Standing balance-Leahy Scale: Zero Standing balance comment: Using stedy and assist of 2     Special needs/care consideration Designated visitors are daughter, Illene Bolusarkita and Lorna FewGrandson, [name].     Previous Home Environment  Living Arrangements:  (daughter, Illene Bolusarkita)  Lives With: Daughter Available Help at Discharge: Family, Available 24  hours/day (Illene Bolusarkita, grandson, [name], and other family members from [place]) Type of Home: House Home Layout: Multi-level, Able to live on main level with bedroom/bathroom, Other (Comment), 1/2 bath on main level (currently on second floor but can move) Alternate Level Stairs-Number of Steps: flight Home Access: Level entry Bathroom Shower/Tub: Walk-in shower (upstairs, only half bath downstairs) Bathroom Toilet:  (1/2 bath downstairs; full bath upstairs)  Bathroom Accessibility: Yes How Accessible: Accessible via walker Home Care Services: No   Discharge Living Setting Plans for Discharge Living Setting: Lives with (comment) (daughter, [name]) Type of Home at Discharge: House Discharge Home Layout: Two level, 1/2 bath on main level, Able to live on main level with bedroom/bathroom Alternate Level Stairs-Number of Steps: flight Discharge Home Access: Level entry Discharge Bathroom Shower/Tub: Walk-in shower (upstairs; 1/2 bath downstairs) Discharge Bathroom Toilet: Standard Discharge Bathroom Accessibility: Yes How Accessible: Accessible via walker Does the patient have any problems obtaining your medications?: Yes (Describe) (uninsured)   Social/Family/Support Systems Patient Roles: Parent Contact Information: Geologist, engineering, daughter Anticipated Caregiver: [name], daughter and grandson, Lenon Curt, as well as other family from [place]  Anticipated Caregiver's Contact Information: [phone] Ability/Limitations of Caregiver: [name] works third shift Caregiver Availability: 24/7 Discharge Plan Discussed with Primary Caregiver: Yes Is Caregiver In Agreement with Plan?: Yes Does Caregiver/Family have Issues with Lodging/Transportation while Pt is in Rehab?: No   Goals Patient/Family Goal for Rehab: min assist with PT, OT and SLP Expected length of stay: ELOS 2 to 3 weeks Additional Information: Daughter has requested other visiotrs from [place] to visit August 12,13 and 14 (Pt's Mom and 5 sisters.  Dr. Berline Chough has stated pt's Mom can  ) Pt/Family Agrees to Admission and willing to participate: Yes Program Orientation Provided & Reviewed with Pt/Caregiver Including Roles  & Responsibilities: Yes Additional Information Needs: Dr. Berline Chough has stated that pt's MOm can visit, but not the additional 5 sisters from [place] due to COVID restricitions  Barriers to Discharge: Insurance for SNF coverage   Decrease burden of Care through IP rehab admission: n/a   Possible need for SNF placement upon discharge:not anticipated   Patient Condition: This patient's medical and functional status has changed since the consult dated 04/23/2020 in which the Rehabilitation Physician determined and documented that the patient was potentially appropriate for intensive rehabilitative care in an inpatient rehabilitation facility. Issues have been addressed and update has been discussed with Dr. Berline Chough and patient now appropriate for inpatient rehabilitation. Will admit to inpatient rehab today.    Preadmission Screen Completed By:  Clois Dupes, RN, 04/30/2020 11:34 AM ______________________________________________________________________   Discussed status with Dr. Berline Chough on 04/30/2020 at 1135 and received approval for admission today.   Admission Coordinator:  Clois Dupes, time 2694 Date 04/30/2020             Cosigned by: Genice Rouge, MD at 04/30/2020 11:38 AM  Revision History                Note Details  Author Standley Brooking, RN File Time 04/30/2020 11:35 AM  Author Type Rehab Admission Coordinator Status Signed  Last Editor Standley Brooking, RN Service Physical Medicine and Rehabilitation

## 2020-04-30 NOTE — Progress Notes (Signed)
Pt arrived with Nurse and NT from previous unit in bed, awake, alert, and with grandson at bedside. Pt's speech therapy recommendations were reviewed with patient myself and family by previous RN. Pt was able to nod yes when verifying self and DOB, but due to expressive aphasia unable to express time, place, and situation. This nurse oriented the patient to the following and to myself as her nurse. Pt was found to have an incontinent of bowel episode and myself and NT Tisha cleaned her and got her settled following the skin assessment done with Carlena Sax, RN. Pt's vitals were taken and pt was settled in bet with family at bedside during admission process.   Pt's family member left at 50

## 2020-04-30 NOTE — Discharge Summary (Signed)
PATIENT DETAILS Name: Lindsey [name] Age: 64 y.o. Sex: female Date of Birth: 1956/06/30 MRN: [id number]. Admitting Physician: Anselm Jungling, DO WUJ:[id number], Sylvie Farrier, MD  Admit Date: 04/21/2020 Discharge date: 04/30/2020  Recommendations for Outpatient Follow-up:  1. Follow up with PCP in 1-2 weeks 2. Please reach out to stroke MD on-call on 05/08/2020-to see whether a repeat CT head is needed before considering initiation of anticoagulation.  Admitted From:  Home  Disposition: CIR   Home Health: Yes  Equipment/Devices: None  Discharge Condition: Stable  CODE STATUS: FULL CODE  Diet recommendation:  Diet Order            Diet - low sodium heart healthy           DIET DYS 2 Room service appropriate? Yes with Assist; Fluid consistency: Thin  Diet effective now                  Brief Narrative: Patient is a 64 y.o. female with history of HTN, EtOH use-who presented with right-sided hemiparesis, dysarthria-found to have acute CVA and A. fib with RVR.  Significant events: 7/20>> admit for acute CVA and A. fib with RVR 7/27>> upgraded to dysphagia 2 diet-coretrak tube removed.  Significant studies: 7/23>> CT head: Evolving left MCA infarct with increasing edema and increased rightward shift, small hemorrhages within the infarct without significant progression. 7/21>> LDL: 127 7/21>> A1c: 6.1 7/21>> echo: EF 60-65% 7/21>> CT head: Evolving left MCA infarct with unchanged small areas of hemorrhage. 7/20>> MRI brain: Acute infarct left MCA territory-mild hemorrhage in the infarct 7/20>> CTA head/neck: Acute occlusion of the inferior division of left M2, no other intracranial/extracranial stenosis.  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 fib.  See work-up results above.  Neurology following-continue ASA for now-repeat CT head on 7/23-as above-discussed with Dr.Xu-stroke MD on 7/28-he requests that attending MD reach out to the stroke MD on around 8/6 to see if we need to repeat a CT head before initiating anticoagulation.   Atrial fibrillation with RVR: Continues to have intermittent RVR-cardiology following-and adjusting beta-blockade dosage.    Currently not a candidate for anticoagulation due to hemorrhagic transformation of CVA.   Dysphagia: Secondary to CVA-had NG tube in place-evaluated by SLP today-upgraded to dysphagia 2 diet-Per nursing staff-tolerating oral intake well.  No longer with NG tube  HTN: BP stable-continue metoprolol.  HLD: Continue statin  Hypokalemia: Repleted  Hypomagnesemia: Repleted  EtOH abuse: Per daughter-patient does not drink every day-apparently has gone several days without withdrawal symptoms when not drinking.  Out of window for withdrawal symptoms.   Discharge Diagnoses:  Principal Problem:   CVA (cerebral vascular accident) [facility]) Active Problems:   Unspecified atrial fibrillation (HCC)   Essential hypertension   Alcohol abuse   Discharge Instructions:  Activity:  As tolerated with Full fall precautions use walker/cane & assistance as needed  Discharge Instructions    Ambulatory referral to Neurology   Complete by: As directed    Follow up in stroke clinic at [facility] with Ihor Austin, NP in about 4 weeks. If not available, consider Dr. Delia Heady, Dr. Jamelle Rushing, or Dr. Naomie Dean.   D/c date unknown at time time. Stroke signing off   Diet - low sodium heart healthy   Complete by:  As directed    Increase activity slowly   Complete by: As directed      Allergies as of 04/30/2020   No Known Allergies     Medication List    STOP taking these medications     amLODipine 10 MG tablet Commonly known as: NORVASC   atenolol 25 MG tablet Commonly known as: TENORMIN   cetirizine 10 MG tablet Commonly known as: ZYRTEC   diazepam 2 MG tablet Commonly known as: Valium   diphenhydrAMINE 25 MG tablet Commonly known as: BENADRYL   hydrOXYzine 25 MG tablet Commonly known as: ATARAX/VISTARIL   lidocaine 5 % Commonly known as: LIDODERM   naproxen 500 MG tablet Commonly known as: Naprosyn   oxyCODONE-acetaminophen 5-325 MG tablet Commonly known as: Percocet   sulfamethoxazole-trimethoprim 800-160 MG tablet Commonly known as: BACTRIM DS     TAKE these medications   aspirin 81 MG chewable tablet Place 1 tablet (81 mg total) into feeding tube daily. Start taking on: May 01, 2020   atorvastatin 40 MG tablet Commonly known as: LIPITOR Place 1 tablet (40 mg total) into feeding tube daily at 6 PM.   feeding supplement (ENSURE ENLIVE) Liqd Take 237 mLs by mouth 3 (three) times daily between meals.   Metoprolol Tartrate 75 MG Tabs Take 75 mg by mouth 2 (two) times daily.       Follow-up Information    Aycock, Ngwe A, MD. Schedule an appointment as soon as possible for a visit in 1 week(s).   Specialty: Family Medicine Contact information: [address] [place] [address] [phone]        [facility] Follow up.   Specialty: Neurology Why: office will call for a follow up appt Contact information: [address] [place] [address] [phone]             No Known Allergies       Other Procedures/Studies: CT Angio Head W or Wo Contrast  Result Date: 04/21/2020 CLINICAL DATA:  Stroke.  Slurred speech EXAM: CT ANGIOGRAPHY HEAD AND NECK TECHNIQUE: Multidetector CT imaging of the head and neck was performed using the standard protocol during bolus administration of intravenous contrast. Multiplanar CT image reconstructions and MIPs were obtained to evaluate  the vascular anatomy. Carotid stenosis measurements (when applicable) are obtained utilizing NASCET criteria, using the distal internal carotid diameter as the denominator. CONTRAST:  46mL OMNIPAQUE IOHEXOL 350 MG/ML SOLN COMPARISON:  CT head 04/21/2020 FINDINGS: CTA NECK FINDINGS Aortic arch: Mild atherosclerotic calcification aortic arch. Bovine branching pattern. Proximal great vessels patent. Right carotid system: Right carotid widely patent without stenosis. Minimal atherosclerotic calcification right carotid bulb. Medial deviation of the carotid bifurcation and internal carotid artery posterior to the pharynx. Left carotid system: Left carotid widely patent with mild atherosclerotic calcification at the bifurcation. Medial deviation of the carotid bifurcation internal carotid artery in the retropharyngeal space. Vertebral arteries: Left vertebral artery dominant. Left vertebral artery patent to the basilar without stenosis. Small right vertebral artery without stenosis. Skeleton: No acute skeletal abnormality. Other neck: Negative for mass or adenopathy in the neck. Upper chest: Lung apices clear bilaterally. Superior mediastinum negative. Review of the MIP images confirms the above findings CTA HEAD FINDINGS Anterior circulation: Internal carotid artery widely patent bilaterally. Left M1 segment widely patent. There is occlusion of the proximal left M2 inferior division which appears acute. This corresponds to low-density on CT compatible with acute infarct. Superior division left MCA patent Both anterior cerebral  arteries widely patent. Right middle cerebral artery and branches widely patent. Posterior circulation: Both vertebral arteries patent to the basilar. PICA patent bilaterally. Basilar widely patent. Superior cerebellar and posterior cerebral arteries patent bilaterally without stenosis or occlusion. Venous sinuses: Early contrast enhancement of the dural sinuses not well evaluated. Anatomic  variants: None Review of the MIP images confirms the above findings IMPRESSION: 1. 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 Acute occlusion inferior division left M2  . 3. No other intracranial stenosis 4. No extracranial stenosis in the carotid or vertebral arteries. 5. These results were called by telephone at the time of interpretation on 04/21/2020 at 6:59 pm to provider JOSHUA LONG , who verbally acknowledged these results. Electronically Signed   By: Marlan Palauharles  Clark M.D.   On: 04/21/2020 19:00   CT HEAD WO CONTRAST  Result Date: 04/24/2020 CLINICAL DATA:  Stroke follow-up. EXAM: CT HEAD WITHOUT CONTRAST TECHNIQUE: Contiguous axial images were obtained from the base of the skull through the vertex without intravenous contrast. COMPARISON:  04/22/2020 head CT and 04/21/2020 MRI FINDINGS: Brain: A moderately large left MCA infarct demonstrates further evolution with increasing cytotoxic edema and increased rightward midline shift which now measures 3 mm. There is partial effacement of the left lateral ventricle. Multiple foci of hemorrhage are again seen within the infarct with some appearing more well-defined and perhaps slightly larger compared to the prior CT, however no substantial enlargement is evident compared to the prior MRI. No new infarct or extra-axial fluid collection is identified. There is no evidence of hydrocephalus. A background of mild chronic small vessel ischemia is again noted in the cerebral white matter. Vascular: Calcified atherosclerosis at the skull base. Skull: No fracture or suspicious osseous lesion. Sinuses/Orbits: Opacification of a single right ethmoid air cell. Clear mastoid air cells. Unremarkable orbits. Other: None. IMPRESSION: 1. Evolving left MCA infarct with increasing edema and increased rightward midline shift, now 3 mm. 2. Small hemorrhages within the infarct without significant progression. Electronically Signed   By: Sebastian AcheAllen  Grady M.D.   On: 04/24/2020 06:46   CT  HEAD WO CONTRAST  Result Date: 04/22/2020 CLINICAL DATA:  Stroke follow-up. EXAM: CT HEAD WITHOUT CONTRAST TECHNIQUE: Contiguous axial images were obtained from the base of the skull through the vertex without intravenous contrast. COMPARISON:  Head CT, CTA, and MRI 04/21/2020 FINDINGS: Brain: An evolving moderately large acute left MCA infarct is similar in extent to the prior MRI with increasing cytotoxic edema. There is partial effacement of the left lateral ventricle with at most trace midline shift. Multiple small areas of hemorrhage within the infarct are similar to the MRI. There is no extra-axial fluid collection. There is a background of mild chronic small vessel ischemia in the cerebral white matter bilaterally. Vascular: Calcified atherosclerosis at the skull base. Hyperdense left MCA branch vessel corresponding to the M2 occlusion on CTA. Skull: No fracture or suspicious osseous lesion. Sinuses/Orbits: Opacification of a single right ethmoid air cell. Clear mastoid air cells. Unremarkable orbits. Other: None. IMPRESSION: Evolving left MCA infarct with unchanged small areas of hemorrhage. Electronically Signed   By: Sebastian AcheAllen  Grady M.D.   On: 04/22/2020 07:47   CT Head Wo Contrast  Result Date: 04/21/2020 CLINICAL DATA:  Stroke suspected, slurred speech, right-sided weakness and facial droop EXAM: CT HEAD WITHOUT CONTRAST TECHNIQUE: Contiguous axial images were obtained from the base of the skull through the vertex without intravenous contrast. COMPARISON:  None. FINDINGS: Brain: There is a hypodensity of the anterior left MCA  territory centered about the insula and frontal operculum, involving the left basal ganglia, particularly the putamen and internal capsule, as well as the high left MCA territory cortex. There is no involvement of the temporal lobe. No evidence of hemorrhage. There is mild mass effect on the left lateral ventricle with approximately 3 mm left right midline shift. Vascular: Subtly  hyperdense M2 segmental vessel in the left insula (series 2, image 12). Skull: Normal. Negative for fracture or focal lesion. Sinuses/Orbits: No acute finding. Other: None. IMPRESSION: 1. There is a large hypodensity of the anterior left MCA territory, findings consistent with acute to subacute left MCA territory infarction. ASPECTS 4. 2.  No evidence of hemorrhage. 3. There is mild mass effect on the left lateral ventricle with approximately 3 mm left right midline shift. Findings reported by telephone to Dr. Jacqulyn Bath at the time of interpretation, 6:48 p.m., 04/21/2020 Electronically Signed   By: Lauralyn Primes M.D.   On: 04/21/2020 18:53   CT Angio Neck W and/or Wo Contrast  Result Date: 04/21/2020 CLINICAL DATA:  Stroke.  Slurred speech EXAM: CT ANGIOGRAPHY HEAD AND NECK TECHNIQUE: Multidetector CT imaging of the head and neck was performed using the standard protocol during bolus administration of intravenous contrast. Multiplanar CT image reconstructions and MIPs were obtained to evaluate the vascular anatomy. Carotid stenosis measurements (when applicable) are obtained utilizing NASCET criteria, using the distal internal carotid diameter as the denominator. CONTRAST:  75mL OMNIPAQUE IOHEXOL 350 MG/ML SOLN COMPARISON:  CT head 04/21/2020 FINDINGS: CTA NECK FINDINGS Aortic arch: Mild atherosclerotic calcification aortic arch. Bovine branching pattern. Proximal great vessels patent. Right carotid system: Right carotid widely patent without stenosis. Minimal atherosclerotic calcification right carotid bulb. Medial deviation of the carotid bifurcation and internal carotid artery posterior to the pharynx. Left carotid system: Left carotid widely patent with mild atherosclerotic calcification at the bifurcation. Medial deviation of the carotid bifurcation internal carotid artery in the retropharyngeal space. Vertebral arteries: Left vertebral artery dominant. Left vertebral artery patent to the basilar without  stenosis. Small right vertebral artery without stenosis. Skeleton: No acute skeletal abnormality. Other neck: Negative for mass or adenopathy in the neck. Upper chest: Lung apices clear bilaterally. Superior mediastinum negative. Review of the MIP images confirms the above findings CTA HEAD FINDINGS Anterior circulation: Internal carotid artery widely patent bilaterally. Left M1 segment widely patent. There is occlusion of the proximal left M2 inferior division which appears acute. This corresponds to low-density on CT compatible with acute infarct. Superior division left MCA patent Both anterior cerebral arteries widely patent. Right middle cerebral artery and branches widely patent. Posterior circulation: Both vertebral arteries patent to the basilar. PICA patent bilaterally. Basilar widely patent. Superior cerebellar and posterior cerebral arteries patent bilaterally without stenosis or occlusion. Venous sinuses: Early contrast enhancement of the dural sinuses not well evaluated. Anatomic variants: None Review of the MIP images confirms the above findings IMPRESSION: 1. 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 Acute occlusion inferior division left M2  . 3. No other intracranial stenosis 4. No extracranial stenosis in the carotid or vertebral arteries. 5. These results were called by telephone at the time of interpretation on 04/21/2020 at 6:59 pm to provider JOSHUA LONG , who verbally acknowledged these results. Electronically Signed   By: Marlan Palau M.D.   On: 04/21/2020 19:00   MR BRAIN WO CONTRAST  Result Date: 04/21/2020 CLINICAL DATA:  Stroke slurred speech EXAM: MRI HEAD WITHOUT CONTRAST TECHNIQUE: Multiplanar, multiecho pulse sequences of the brain and  surrounding structures were obtained without intravenous contrast. COMPARISON:  CT angio head neck 05/01/2020 FINDINGS: Brain: Acute infarct left MCA territory. Infarct involves the left frontal lobe, insula as well as the caudate and putamen. There  are areas of hemorrhage within the caudate and putamen as well as in the left frontal lobe. These are not identified on CT. Scattered small white matter hyperintensities elsewhere compatible with chronic microvascular ischemia. Ventricle size normal. Negative for mass lesion. Vascular: Normal arterial flow voids Skull and upper cervical spine: No focal skeletal lesion Sinuses/Orbits: Negative Other: None IMPRESSION: Acute infarct left MCA territory there is mild hemorrhage in the infarct which is not seen on recent CT earlier today. These results were called by telephone at the time of interpretation on 04/21/2020 at 9:52 pm to provider CHING TU , who verbally acknowledged these results. Electronically Signed   By: Marlan Palau M.D.   On: 04/21/2020 21:53   DG Swallowing Func-Speech Pathology  Result Date: 04/28/2020 Objective Swallowing Evaluation: Type of Study: MBS-Modified Barium Swallow Study  Patient Details Name: Lindsey [name] MRN: [id number] Date of Birth: 1956-08-02 Today's Date: 04/28/2020 Time: SLP Start Time (ACUTE ONLY): 1310 -SLP Stop Time (ACUTE ONLY): 1340 SLP Time Calculation (min) (ACUTE ONLY): 30 min Past Medical History: Past Medical History: Diagnosis Date . Diverticulosis  . Hypertension  . Uterine prolapse  Past Surgical History: No past surgical history on file. HPI: Pt is a 64 y.o. female admitted 04/21/20 with R weakness, facial droop, and severe dysarthria. CT and MRI brain reveal large L MCA, initially ischemic but with some hemorrhagic conversion and involving left frontal lobe, insula as well as the caudate and putamen. CT scan 7/23 shows evolving large L MCA infarct with stable hemorrhagic transformation but increasing edema and 3 mm left-to-right shift. PMH includes HTN, afib with RVR.  Subjective: alert Assessment / Plan / Recommendation CHL IP CLINICAL IMPRESSIONS 04/28/2020 Clinical Impression Pt presents with a primary moderate oral dysphagia marked by decreased sensation on right  side of buccal cavity and decreased bolus cohesion/manipulation.  These deficits led to moderate residue in the right side of mouth without pt's awareness.  Pharyngeally, pt's swalllow was quite functional.  The pharyngeal swallow was initiated between the valleculae and pyriforms, there was reliable laryngeal vestibule closure for all consistencies (high penetration x 1 with mixed liquid/solid consistency, PAS score #2), and no aspiration.  There was good pharyngeal squeeze and no residue in the pharynx post-swallow.  Recommend advancing diet to dysphagia 2, thin liquids.  Given sensory deficits, pt will require full supervision to ensure safety and attention to left side of mouth during meals.  Give meds whole in puree.  SLP will continue to follow.  SLP Visit Diagnosis Dysphagia, oral phase (R13.11) Attention and concentration deficit following -- Frontal lobe and executive function deficit following -- Impact on safety and function --   CHL IP TREATMENT RECOMMENDATION 04/28/2020 Treatment Recommendations Defer treatment plan to f/u with SLP   Prognosis 04/28/2020 Prognosis for Safe Diet Advancement Good Barriers to Reach Goals -- Barriers/Prognosis Comment -- CHL IP DIET RECOMMENDATION 04/28/2020 SLP Diet Recommendations Dysphagia 2 (Fine chop) solids;Thin liquid Liquid Administration via Cup;Straw Medication Administration Whole meds with puree Compensations Minimize environmental distractions;Slow rate;Small sips/bites;Monitor for anterior loss Postural Changes --   CHL IP OTHER RECOMMENDATIONS 04/28/2020 Recommended Consults -- Oral Care Recommendations Oral care BID Other Recommendations --   CHL IP FOLLOW UP RECOMMENDATIONS 04/28/2020 Follow up Recommendations Inpatient Rehab   CHL IP FREQUENCY AND DURATION  04/28/2020 Speech Therapy Frequency (ACUTE ONLY) min 2x/week Treatment Duration 2 weeks      CHL IP ORAL PHASE 04/28/2020 Oral Phase Impaired Oral - Pudding Teaspoon -- Oral - Pudding Cup -- Oral - Honey  Teaspoon -- Oral - Honey Cup -- Oral - Nectar Teaspoon -- Oral - Nectar Cup -- Oral - Nectar Straw -- Oral - Thin Teaspoon -- Oral - Thin Cup -- Oral - Thin Straw Right anterior bolus loss;Reduced posterior propulsion;Decreased bolus cohesion Oral - Puree Right pocketing in lateral sulci;Lingual/palatal residue;Delayed oral transit;Decreased bolus cohesion Oral - Mech Soft -- Oral - Regular Right pocketing in lateral sulci;Lingual/palatal residue;Delayed oral transit;Decreased bolus cohesion Oral - Multi-Consistency -- Oral - Pill -- Oral Phase - Comment --  CHL IP PHARYNGEAL PHASE 04/28/2020 Pharyngeal Phase WFL Pharyngeal- Pudding Teaspoon -- Pharyngeal -- Pharyngeal- Pudding Cup -- Pharyngeal -- Pharyngeal- Honey Teaspoon -- Pharyngeal -- Pharyngeal- Honey Cup -- Pharyngeal -- Pharyngeal- Nectar Teaspoon -- Pharyngeal -- Pharyngeal- Nectar Cup -- Pharyngeal -- Pharyngeal- Nectar Straw -- Pharyngeal -- Pharyngeal- Thin Teaspoon -- Pharyngeal -- Pharyngeal- Thin Cup -- Pharyngeal -- Pharyngeal- Thin Straw -- Pharyngeal -- Pharyngeal- Puree -- Pharyngeal -- Pharyngeal- Mechanical Soft -- Pharyngeal -- Pharyngeal- Regular -- Pharyngeal -- Pharyngeal- Multi-consistency -- Pharyngeal -- Pharyngeal- Pill -- Pharyngeal -- Pharyngeal Comment --  No flowsheet data found. Blenda Mounts Laurice 04/28/2020, 2:11 PM              ECHOCARDIOGRAM COMPLETE  Result Date: 04/22/2020    ECHOCARDIOGRAM REPORT   Patient Name:   Lindsey [name] Date of Exam: 04/22/2020 Medical Rec #:  [id number]     Height:       65.0 in Accession #:    [id number]    Weight:       167.1 lb Date of Birth:  30-Apr-1956     BSA:          1.833 m Patient Age:    64 years      BP:           115/88 mmHg Patient Gender: F             HR:           114 bpm. Exam Location:  Inpatient Procedure: 2D Echo, Cardiac Doppler and Color Doppler Indications:    Stroke  History:        Patient has no prior history of Echocardiogram examinations.                 Stroke,  Arrythmias:Atrial Fibrillation; Risk                 Factors:Hypertension. ETOH.  Sonographer:    Sheralyn Boatman RDCS Referring Phys: [id number] CHING T TU  Sonographer Comments: Technically difficult study due to poor echo windows. Poor sound wave transmission. IMPRESSIONS  1. Left ventricular ejection fraction, by estimation, is 60 to 65%. The left ventricle has normal function. The left ventricle has no regional wall motion abnormalities. There is mild left ventricular hypertrophy. Left ventricular diastolic parameters are indeterminate.  2. Right ventricular systolic function is normal. The right ventricular size is normal. There is normal pulmonary artery systolic pressure. The estimated right ventricular systolic pressure is 27.6 mmHg.  3. Left atrial size was mild to moderately dilated.  4. Right atrial size was mildly dilated.  5. The mitral valve is normal in structure. No evidence of mitral valve regurgitation. No evidence of mitral stenosis.  6. The aortic valve is tricuspid. Aortic  valve regurgitation is not visualized. No aortic stenosis is present.  7. The inferior vena cava is normal in size with greater than 50% respiratory variability, suggesting right atrial pressure of 3 mmHg. FINDINGS  Left Ventricle: Left ventricular ejection fraction, by estimation, is 60 to 65%. The left ventricle has normal function. The left ventricle has no regional wall motion abnormalities. The left ventricular internal cavity size was normal in size. There is  mild left ventricular hypertrophy. Left ventricular diastolic parameters are indeterminate. Right Ventricle: The right ventricular size is normal. No increase in right ventricular wall thickness. Right ventricular systolic function is normal. There is normal pulmonary artery systolic pressure. The tricuspid regurgitant velocity is 2.48 m/s, and  with an assumed right atrial pressure of 3 mmHg, the estimated right ventricular systolic pressure is 27.6 mmHg. Left Atrium:  Left atrial size was mild to moderately dilated. Right Atrium: Right atrial size was mildly dilated. Pericardium: Trivial pericardial effusion is present. Mitral Valve: The mitral valve is normal in structure. There is mild calcification of the mitral valve leaflet(s). Mild mitral annular calcification. No evidence of mitral valve regurgitation. No evidence of mitral valve stenosis. Tricuspid Valve: The tricuspid valve is normal in structure. Tricuspid valve regurgitation is trivial. Aortic Valve: The aortic valve is tricuspid. Aortic valve regurgitation is not visualized. No aortic stenosis is present. Pulmonic Valve: The pulmonic valve was normal in structure. Pulmonic valve regurgitation is not visualized. Aorta: The aortic root is normal in size and structure. Venous: The inferior vena cava is normal in size with greater than 50% respiratory variability, suggesting right atrial pressure of 3 mmHg. IAS/Shunts: No atrial level shunt detected by color flow Doppler.  LEFT VENTRICLE PLAX 2D LVIDd:         3.85 cm LVIDs:         2.73 cm LV PW:         0.89 cm LV IVS:        1.40 cm LVOT diam:     1.70 cm LV SV:         38 LV SV Index:   21 LVOT Area:     2.27 cm  LV Volumes (MOD) LV vol d, MOD A2C: 52.1 ml LV vol d, MOD A4C: 48.9 ml LV vol s, MOD A2C: 21.7 ml LV vol s, MOD A4C: 17.7 ml LV SV MOD A2C:     30.4 ml LV SV MOD A4C:     48.9 ml LV SV MOD BP:      30.4 ml RIGHT VENTRICLE         IVC TAPSE (M-mode): 1.4 cm  IVC diam: 1.73 cm LEFT ATRIUM             Index       RIGHT ATRIUM           Index LA diam:        3.60 cm 1.96 cm/m  RA Area:     17.90 cm LA Vol (A2C):   75.4 ml 41.14 ml/m RA Volume:   53.70 ml  29.30 ml/m LA Vol (A4C):   53.8 ml 29.36 ml/m LA Biplane Vol: 64.1 ml 34.98 ml/m  AORTIC VALVE LVOT Vmax:   102.00 cm/s LVOT Vmean:  62.200 cm/s LVOT VTI:    0.168 m  AORTA Ao Root diam: 3.10 cm Ao Asc diam:  3.20 cm MITRAL VALVE                TRICUSPID VALVE MV Area (PHT):  4.28 cm     TR Peak grad:    24.6 mmHg MV Decel Time: 177 msec     TR Vmax:        248.00 cm/s MV E velocity: 108.67 cm/s                             SHUNTS                             Systemic VTI:  0.17 m                             Systemic Diam: 1.70 cm Marca Ancona MD Electronically signed by Marca Ancona MD Signature Date/Time: 04/22/2020/4:34:41 PM    Final    VAS [place] LOWER EXTREMITY VENOUS (DVT)  Result Date: 04/22/2020  Lower Venous DVTStudy Indications: Stroke.  Comparison Study: no prior Performing Technologist: [name] RVS  Examination Guidelines: A complete evaluation includes B-mode imaging, spectral Doppler, color Doppler, and power Doppler as needed of all accessible portions of each vessel. Bilateral testing is considered an integral part of a complete examination. Limited examinations for reoccurring indications may be performed as noted. The reflux portion of the exam is performed with the patient in reverse Trendelenburg.  +---------+---------------+---------+-----------+----------+--------------+ RIGHT    CompressibilityPhasicitySpontaneityPropertiesThrombus Aging +---------+---------------+---------+-----------+----------+--------------+ CFV      Full           Yes      Yes                                 +---------+---------------+---------+-----------+----------+--------------+ SFJ      Full                                                        +---------+---------------+---------+-----------+----------+--------------+ FV Prox  Full                                                        +---------+---------------+---------+-----------+----------+--------------+ FV Mid   Full                                                        +---------+---------------+---------+-----------+----------+--------------+ FV DistalFull                                                        +---------+---------------+---------+-----------+----------+--------------+ PFV      Full                                                         +---------+---------------+---------+-----------+----------+--------------+  POP      Full           Yes      Yes                                 +---------+---------------+---------+-----------+----------+--------------+ PTV      Full                                                        +---------+---------------+---------+-----------+----------+--------------+ PERO                                                  Not visualized +---------+---------------+---------+-----------+----------+--------------+   +---------+---------------+---------+-----------+----------+--------------+ LEFT     CompressibilityPhasicitySpontaneityPropertiesThrombus Aging +---------+---------------+---------+-----------+----------+--------------+ CFV      Full           Yes      Yes                                 +---------+---------------+---------+-----------+----------+--------------+ SFJ      Full                                                        +---------+---------------+---------+-----------+----------+--------------+ FV Prox  Full                                                        +---------+---------------+---------+-----------+----------+--------------+ FV Mid   Full                                                        +---------+---------------+---------+-----------+----------+--------------+ FV DistalFull                                                        +---------+---------------+---------+-----------+----------+--------------+ PFV      Full                                                        +---------+---------------+---------+-----------+----------+--------------+ POP      Full           Yes      Yes                                 +---------+---------------+---------+-----------+----------+--------------+  PTV      Full                                                         +---------+---------------+---------+-----------+----------+--------------+ PERO                                                  Not visualized +---------+---------------+---------+-----------+----------+--------------+     Summary: BILATERAL: - No evidence of deep vein thrombosis seen in the lower extremities, bilaterally. - No evidence of superficial venous thrombosis in the lower extremities, bilaterally. -No evidence of popliteal cyst, bilaterally.   *See table(s) above for measurements and observations. Electronically signed by Fabienne Bruns MD on 04/22/2020 at 6:11:35 PM.    Final      TODAY-DAY OF DISCHARGE:  Subjective:   Lindsey [name] today has no headache,no chest abdominal pain,no new weakness tingling or numbness, feels much better wants to go home today.   Objective:   Blood pressure (!) 124/93, pulse 95, temperature 98 F (36.7 C), resp. rate 18, weight 77.9 kg, SpO2 96 %.  Intake/Output Summary (Last 24 hours) at 04/30/2020 1025 Last data filed at 04/29/2020 2038 Gross per 24 hour  Intake 240 ml  Output --  Net 240 ml   Filed Weights   04/28/20 0412 04/29/20 0400 04/30/20 0431  Weight: 80.4 kg 77.6 kg 77.9 kg    Exam: Awake Alert, Oriented *3, No new F.N deficits, Normal affect [place].AT,PERRAL Supple Neck,No JVD, No cervical lymphadenopathy appriciated.  Symmetrical Chest wall movement, Good air movement bilaterally, CTAB RRR,No Gallops,Rubs or new Murmurs, No Parasternal Heave +ve B.Sounds, Abd Soft, Non tender, No organomegaly appriciated, No rebound -guarding or rigidity. No Cyanosis, Clubbing or edema, No new Rash or bruise   PERTINENT RADIOLOGIC STUDIES: DG Swallowing Func-Speech Pathology  Result Date: 04/28/2020 Objective Swallowing Evaluation: Type of Study: MBS-Modified Barium Swallow Study  Patient Details Name: Lindsey [name] MRN: [id number] Date of Birth: June 25, 1956 Today's Date: 04/28/2020 Time: SLP Start Time (ACUTE ONLY): 1310 -SLP Stop Time  (ACUTE ONLY): 1340 SLP Time Calculation (min) (ACUTE ONLY): 30 min Past Medical History: Past Medical History: Diagnosis Date . Diverticulosis  . Hypertension  . Uterine prolapse  Past Surgical History: No past surgical history on file. HPI: Pt is a 64 y.o. female admitted 04/21/20 with R weakness, facial droop, and severe dysarthria. CT and MRI brain reveal large L MCA, initially ischemic but with some hemorrhagic conversion and involving left frontal lobe, insula as well as the caudate and putamen. CT scan 7/23 shows evolving large L MCA infarct with stable hemorrhagic transformation but increasing edema and 3 mm left-to-right shift. PMH includes HTN, afib with RVR.  Subjective: alert Assessment / Plan / Recommendation CHL IP CLINICAL IMPRESSIONS 04/28/2020 Clinical Impression Pt presents with a primary moderate oral dysphagia marked by decreased sensation on right side of buccal cavity and decreased bolus cohesion/manipulation.  These deficits led to moderate residue in the right side of mouth without pt's awareness.  Pharyngeally, pt's swalllow was quite functional.  The pharyngeal swallow was initiated between the valleculae and pyriforms, there was reliable laryngeal vestibule closure for all consistencies (high penetration x 1 with mixed liquid/solid consistency, PAS score #  2), and no aspiration.  There was good pharyngeal squeeze and no residue in the pharynx post-swallow.  Recommend advancing diet to dysphagia 2, thin liquids.  Given sensory deficits, pt will require full supervision to ensure safety and attention to left side of mouth during meals.  Give meds whole in puree.  SLP will continue to follow.  SLP Visit Diagnosis Dysphagia, oral phase (R13.11) Attention and concentration deficit following -- Frontal lobe and executive function deficit following -- Impact on safety and function --   CHL IP TREATMENT RECOMMENDATION 04/28/2020 Treatment Recommendations Defer treatment plan to f/u with SLP   Prognosis  04/28/2020 Prognosis for Safe Diet Advancement Good Barriers to Reach Goals -- Barriers/Prognosis Comment -- CHL IP DIET RECOMMENDATION 04/28/2020 SLP Diet Recommendations Dysphagia 2 (Fine chop) solids;Thin liquid Liquid Administration via Cup;Straw Medication Administration Whole meds with puree Compensations Minimize environmental distractions;Slow rate;Small sips/bites;Monitor for anterior loss Postural Changes --   CHL IP OTHER RECOMMENDATIONS 04/28/2020 Recommended Consults -- Oral Care Recommendations Oral care BID Other Recommendations --   CHL IP FOLLOW UP RECOMMENDATIONS 04/28/2020 Follow up Recommendations Inpatient Rehab   CHL IP FREQUENCY AND DURATION 04/28/2020 Speech Therapy Frequency (ACUTE ONLY) min 2x/week Treatment Duration 2 weeks      CHL IP ORAL PHASE 04/28/2020 Oral Phase Impaired Oral - Pudding Teaspoon -- Oral - Pudding Cup -- Oral - Honey Teaspoon -- Oral - Honey Cup -- Oral - Nectar Teaspoon -- Oral - Nectar Cup -- Oral - Nectar Straw -- Oral - Thin Teaspoon -- Oral - Thin Cup -- Oral - Thin Straw Right anterior bolus loss;Reduced posterior propulsion;Decreased bolus cohesion Oral - Puree Right pocketing in lateral sulci;Lingual/palatal residue;Delayed oral transit;Decreased bolus cohesion Oral - Mech Soft -- Oral - Regular Right pocketing in lateral sulci;Lingual/palatal residue;Delayed oral transit;Decreased bolus cohesion Oral - Multi-Consistency -- Oral - Pill -- Oral Phase - Comment --  CHL IP PHARYNGEAL PHASE 04/28/2020 Pharyngeal Phase WFL Pharyngeal- Pudding Teaspoon -- Pharyngeal -- Pharyngeal- Pudding Cup -- Pharyngeal -- Pharyngeal- Honey Teaspoon -- Pharyngeal -- Pharyngeal- Honey Cup -- Pharyngeal -- Pharyngeal- Nectar Teaspoon -- Pharyngeal -- Pharyngeal- Nectar Cup -- Pharyngeal -- Pharyngeal- Nectar Straw -- Pharyngeal -- Pharyngeal- Thin Teaspoon -- Pharyngeal -- Pharyngeal- Thin Cup -- Pharyngeal -- Pharyngeal- Thin Straw -- Pharyngeal -- Pharyngeal- Puree -- Pharyngeal --  Pharyngeal- Mechanical Soft -- Pharyngeal -- Pharyngeal- Regular -- Pharyngeal -- Pharyngeal- Multi-consistency -- Pharyngeal -- Pharyngeal- Pill -- Pharyngeal -- Pharyngeal Comment --  No flowsheet data found. Blenda Mounts Laurice 04/28/2020, 2:11 PM                PERTINENT LAB RESULTS: CBC: Recent Labs    04/29/20 0125  WBC 4.5  HGB 11.7*  HCT 35.5*  PLT 233   CMET CMP     Component Value Date/Time   NA 133 (L) 04/29/2020 0125   NA 141 08/17/2014 1806   K 4.5 04/29/2020 0125   K 3.2 (L) 08/17/2014 1806   CL 98 04/29/2020 0125   CL 102 08/17/2014 1806   CO2 23 04/29/2020 0125   CO2 33 (H) 08/17/2014 1806   GLUCOSE 106 (H) 04/29/2020 0125   GLUCOSE 92 08/17/2014 1806   BUN 17 04/29/2020 0125   BUN 6 (L) 08/17/2014 1806   CREATININE 0.54 04/29/2020 0125   CREATININE 0.57 (L) 08/17/2014 1806   CALCIUM 9.1 04/29/2020 0125   CALCIUM 8.1 (L) 08/17/2014 1806   PROT 8.4 (H) 04/21/2020 1749   PROT 7.7 08/17/2014 1806  ALBUMIN 3.8 04/21/2020 1749   ALBUMIN 3.3 (L) 08/17/2014 1806   AST 56 (H) 04/21/2020 1749   AST 122 (H) 08/17/2014 1806   ALT 38 04/21/2020 1749   ALT 183 (H) 08/17/2014 1806   ALKPHOS 79 04/21/2020 1749   ALKPHOS 107 08/17/2014 1806   BILITOT 1.4 (H) 04/21/2020 1749   BILITOT 0.4 08/17/2014 1806   GFRNONAA >60 04/29/2020 0125   GFRNONAA >60 08/17/2014 1806   GFRAA >60 04/29/2020 0125   GFRAA >60 08/17/2014 1806    GFR CrCl cannot be calculated (Unknown ideal weight.). No results for input(s): LIPASE, AMYLASE in the last 72 hours. No results for input(s): CKTOTAL, CKMB, CKMBINDEX, TROPONINI in the last 72 hours. Invalid input(s): POCBNP No results for input(s): DDIMER in the last 72 hours. No results for input(s): HGBA1C in the last 72 hours. No results for input(s): CHOL, HDL, LDLCALC, TRIG, CHOLHDL, LDLDIRECT in the last 72 hours. No results for input(s): TSH, T4TOTAL, T3FREE, THYROIDAB in the last 72 hours.  Invalid input(s): FREET3 No  results for input(s): VITAMINB12, FOLATE, FERRITIN, TIBC, IRON, RETICCTPCT in the last 72 hours. Coags: No results for input(s): INR in the last 72 hours.  Invalid input(s): PT Microbiology: Recent Results (from the past 240 hour(s))  SARS Coronavirus 2 by RT PCR (hospital order, performed in [facility] hospital lab) Nasopharyngeal Nasopharyngeal Swab     Status: None   Collection Time: 04/21/20  6:51 PM   Specimen: Nasopharyngeal Swab  Result Value Ref Range Status   SARS Coronavirus 2 NEGATIVE NEGATIVE Final    Comment: (NOTE) SARS-CoV-2 target nucleic acids are NOT DETECTED.  The SARS-CoV-2 RNA is generally detectable in upper and lower respiratory specimens during the acute phase of infection. The lowest concentration of SARS-CoV-2 viral copies this assay can detect is 250 copies / mL. A negative result does not preclude SARS-CoV-2 infection and should not be used as the sole basis for treatment or other patient management decisions.  A negative result may occur with improper specimen collection / handling, submission of specimen other than nasopharyngeal swab, presence of viral mutation(s) within the areas targeted by this assay, and inadequate number of viral copies (<250 copies / mL). A negative result must be combined with clinical observations, patient history, and epidemiological information.  Fact Sheet for Patients:   BoilerBrush.com.cy  Fact Sheet for Healthcare Providers: https://pope.com/  This test is not yet approved or  cleared by the [place] FDA and has been authorized for detection and/or diagnosis of SARS-CoV-2 by FDA under an Emergency Use Authorization (EUA).  This EUA will remain in effect (meaning this test can be used) for the duration of the COVID-19 declaration under Section 564(b)(1) of the Act, 21 U.S.C. section 360bbb-3(b)(1), unless the authorization is terminated or revoked  sooner.  Performed at [facility], [address] [place], [place] [address]   Culture, blood (Routine X 2) w Reflex to ID Panel     Status: None   Collection Time: 04/21/20  8:25 PM   Specimen: BLOOD RIGHT HAND  Result Value Ref Range Status   Specimen Description BLOOD RIGHT HAND  Final   Special Requests   Final    BOTTLES DRAWN AEROBIC AND ANAEROBIC Blood Culture adequate volume   Culture   Final    NO GROWTH 5 DAYS Performed at [facility], [address], [place], [place] [address]    Report Status 04/26/2020 FINAL  Final  MRSA PCR Screening  Status: None   Collection Time: 04/22/20  1:17 AM   Specimen: Nasal Mucosa; Nasopharyngeal  Result Value Ref Range Status   MRSA by PCR NEGATIVE NEGATIVE Final    Comment:        The GeneXpert MRSA Assay (FDA approved for NASAL specimens only), is one component of a comprehensive MRSA colonization surveillance program. It is not intended to diagnose MRSA infection nor to guide or monitor treatment for MRSA infections. Performed at [facility], [address] [place], [place] [address]     FURTHER DISCHARGE INSTRUCTIONS:  Get Medicines reviewed and adjusted: Please take all your medications with you for your next visit with your Primary MD  Laboratory/radiological data: Please request your Primary MD to go over all hospital tests and procedure/radiological results at the follow up, please ask your Primary MD to get all Hospital records sent to his/her office.  In some cases, they will be blood work, cultures and biopsy results pending at the time of your discharge. Please request that your primary care M.D. goes through all the records of your hospital data and follows up on these results.  Also Note the following: If you experience worsening of your admission symptoms, develop shortness of breath, life threatening emergency, suicidal or homicidal thoughts you must seek medical attention immediately  by calling 911 or calling your MD immediately  if symptoms less severe.  You must read complete instructions/literature along with all the possible adverse reactions/side effects for all the Medicines you take and that have been prescribed to you. Take any new Medicines after you have completely understood and accpet all the possible adverse reactions/side effects.   Do not drive when taking Pain medications or sleeping medications (Benzodaizepines)  Do not take more than prescribed Pain, Sleep and Anxiety Medications. It is not advisable to combine anxiety,sleep and pain medications without talking with your primary care practitioner  Special Instructions: If you have smoked or chewed Tobacco  in the last 2 yrs please stop smoking, stop any regular Alcohol  and or any Recreational drug use.  Wear Seat belts while driving.  Please note: You were cared for by a hospitalist during your hospital stay. Once you are discharged, your primary care physician will handle any further medical issues. Please note that NO REFILLS for any discharge medications will be authorized once you are discharged, as it is imperative that you return to your primary care physician (or establish a relationship with a primary care physician if you do not have one) for your post hospital discharge needs so that they can reassess your need for medications and monitor your lab values.  Total Time spent coordinating discharge including counseling, education and face to face time equals 35 minutes.  SignedJeoffrey Massed 04/30/2020 10:25 AM

## 2020-04-30 NOTE — H&P (Signed)
Physical Medicine and Rehabilitation Admission H&P    Chief Complaint  Patient presents with  . Stroke with decline in functional status.      HPI:  Lindsey [name] is a 64 year old female with history fo HTN, uterine prolapse, alcohol abuse who was admitted on 04/21/20 with right-sided weakness and slurred speech.  She was last seen normal the evening before and family noted slurred speech next a.m. but contributed to intoxicated state.  She was admitted later that evening for work-up.  She was found to have dysarthria, aphasia, left gaze preference and right hemiparesis.  She was also noted to be in A. fib with RVR and UDS positive for THC.  CT head showed large-MCA infarct and CTA brain revealed a left M2 occlusion but patient out of window for thrombolysis.  Follow-up MRI brain later that day showed acute left-MCA infarct with mild hemorrhage in caudate,  putamen and left frontal lobe.   2D echo showed EF of 60 to 65% with mild LVH, mild RA dilatation and no wall abnormality.  BLE Dopplers were negative for DVT.  Follow-up CT head showed evolving infarct with increase in cytotoxic edema and partial effacement of left lateral ventricle with stable areas of hemorrhage.  Dr. Pearlean [name]  felt that stroke was embolic in setting of A. fib and ASA was added on 07/21--to repeat CT head in 7-10 days to decide on initiating AC.  Cardiology was consulted to help manage A. Fib/flutter and IV metoprolol added for rate control and transitioned to po yesterday.  Patient limited by aphasia expressive>receptive and dense right hemiplegia. Therapy ongoing and working on balance at EOB.  CIR recommended due to functional decline.    Pt last has a BM yesterday per nursing on 5W- although also had incontinent BM when arrived to CIR.  Is voiding using a purewick- explained to pt and grandson will not be able to use purewick in CIR because pts move so much.  Pt denies any pain by shaking her head no.  Is currently on  D2 with thins diet and on lovenox- OK'd by Neurology.    Review of Systems  Unable to perform ROS: Patient nonverbal      Past Medical History:  Diagnosis Date  . Diverticulosis   . Hypertension   . Uterine prolapse      No past surgical history on file.    Family History  Problem Relation Age of Onset  . Breast cancer Neg Hx     Social History:   Moved to [place] a few years ago. 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 Per reports that she quit smoking about 41 years ago. Her smoking use included cigarettes. She smoked 0.25 packs per day. She has never used smokeless tobacco. Per reports current alcohol use. She reports that she does not use drugs.    Allergies: No Known Allergies    Medications Prior to Admission  Medication Sig Dispense Refill  . [START ON 05/01/2020] aspirin 81 MG chewable tablet Place 1 tablet (81 mg total) into feeding tube daily.    Marland Kitchen atorvastatin (LIPITOR) 40 MG tablet Place 1 tablet (40 mg total) into feeding tube daily at 6 PM.    . feeding supplement, ENSURE ENLIVE, (ENSURE ENLIVE) LIQD Take 237 mLs by mouth 3 (three) times daily between meals. 237 mL 12  . Metoprolol Tartrate 75 MG TABS Take 75 mg by mouth 2 (two) times daily.  Drug Regimen Review  Drug regimen was reviewed and remains appropriate with no significant issues identified  Home: Home Living Family/patient expects to be discharged to:: Private residence (w daughter) Living Arrangements: Children (w daughter)   Functional History:    Functional Status:  Mobility:          ADL:    Cognition: Cognition Orientation Level: Other (comment), Oriented to person (expressive dysphasia; able to verify name and dob w/ knod)     Blood pressure 106/79, pulse 73, temperature 98.7 F (37.1 C), temperature source Oral, resp. rate 12, height 5\' 5"  (1.651 m), weight 76.2 kg, SpO2 99 %. Physical Exam Vitals and nursing  note reviewed. Exam conducted with a chaperone present.  Constitutional:      Comments: Pt sitting up in bed- grandson and RN in room; appropriate, almost completely nonverbal- NAD  HENT:     Head: Normocephalic and atraumatic.     Comments: Severe R facial droop- tongue midline- slightly coated    Right Ear: External ear normal.     Left Ear: External ear normal.     Nose: Nose normal. No congestion or rhinorrhea.     Mouth/Throat:     Mouth: Mucous membranes are moist.     Pharynx: Oropharynx is clear. No oropharyngeal exudate or posterior oropharyngeal erythema.     Comments: Tongue with white coating.  Eyes:     Comments: Although has a L gaze preference, with encouragement EOM appeared intact- no nystagmus B/L  Cardiovascular:     Comments: Rate controlled-rate in 70s-80s- sounded to be in Regular rate/rhythm on exam Pulmonary:     Comments: CTA B/L- no W/R/R- good air movement-  Abdominal:     Comments: Soft, NT, ND, (+)BS normoactive  Genitourinary:    Comments: purewick in place- light amber urine Musculoskeletal:        General: No swelling.     Cervical back: Normal range of motion and neck supple. No rigidity or tenderness.     Comments: Strength RUE- 0/5 in deltoid, biceps, triceps, WE< grip and finger abd- all tested LUE- 5/5 in same muscles RLE- HF, KE, KF, DF and PF 0/5- all tested LLE- 5/5 in LLE same muscles tested  Skin:    Comments: IV R antecubital fossa- no infiltrate- no skin breakdown on backside, heels  Neurological:     Mental Status: She is alert.     Comments: Right inattention and unable to move eyes to right field. Right facial droop. She did not attempt to speak and unable to choose name with choice of two. Apraxia? Unable to point but looked at window and TV when asked to point. Right dense hemiplegia.   R inattention- CN's as previously documented Was able to SAY no and yes when asked, but mainly shook her head yes and no unless pressed Sensation  intact to light touch x4 extremities and face B/L per pt Significant expressive aphasia- as above, however appears to understand more than expected- good eye contact  Psychiatric:     Comments: Appropriate, considering aphasia    Results for orders placed or performed during the hospital encounter of 04/21/20 (from the past 48 hour(s))  Glucose, capillary     Status: Abnormal   Collection Time: 04/28/20  7:57 PM  Result Value Ref Range   Glucose-Capillary 106 (H) 70 - 99 mg/dL    Comment: Glucose reference range applies only to samples taken after fasting for at least 8 hours.  Glucose, capillary  Status: Abnormal   Collection Time: 04/28/20 11:51 PM  Result Value Ref Range   Glucose-Capillary 101 (H) 70 - 99 mg/dL    Comment: Glucose reference range applies only to samples taken after fasting for at least 8 hours.  CBC     Status: Abnormal   Collection Time: 04/29/20  1:25 AM  Result Value Ref Range   WBC 4.5 4.0 - 10.5 K/uL   RBC 4.00 3.87 - 5.11 MIL/uL   Hemoglobin 11.7 (L) 12.0 - 15.0 g/dL   HCT 16.135.5 (L) 36 - 46 %   MCV 88.8 80.0 - 100.0 fL   MCH 29.3 26.0 - 34.0 pg   MCHC 33.0 30.0 - 36.0 g/dL   RDW 09.614.2 04.511.5 - 40.915.5 %   Platelets 233 150 - 400 K/uL   nRBC 0.0 0.0 - 0.2 %    Comment: Performed at [facility], [address] [place], [place] [address]  Basic metabolic panel     Status: Abnormal   Collection Time: 04/29/20  1:25 AM  Result Value Ref Range   Sodium 133 (L) 135 - 145 mmol/L   Potassium 4.5 3.5 - 5.1 mmol/L   Chloride 98 98 - 111 mmol/L   CO2 23 22 - 32 mmol/L   Glucose, Bld 106 (H) 70 - 99 mg/dL    Comment: Glucose reference range applies only to samples taken after fasting for at least 8 hours.   BUN 17 8 - 23 mg/dL   Creatinine, Ser 4.780.54 0.44 - 1.00 mg/dL   Calcium 9.1 8.9 - 29.510.3 mg/dL   GFR calc non Af Amer >60 >60 mL/min   GFR calc Af Amer >60 >60 mL/min   Anion gap 12 5 - 15    Comment: Performed at [facility], [address] [place], [place] [address]  Glucose, capillary     Status: None   Collection Time: 04/29/20  4:03 AM  Result Value Ref Range   Glucose-Capillary 96 70 - 99 mg/dL    Comment: Glucose reference range applies only to samples taken after fasting for at least 8 hours.  Glucose, capillary     Status: None   Collection Time: 04/29/20  7:39 AM  Result Value Ref Range   Glucose-Capillary 87 70 - 99 mg/dL    Comment: Glucose reference range applies only to samples taken after fasting for at least 8 hours.  Glucose, capillary     Status: Abnormal   Collection Time: 04/29/20 11:58 AM  Result Value Ref Range   Glucose-Capillary 105 (H) 70 - 99 mg/dL    Comment: Glucose reference range applies only to samples taken after fasting for at least 8 hours.  Glucose, capillary     Status: None   Collection Time: 04/29/20  4:05 PM  Result Value Ref Range   Glucose-Capillary 99 70 - 99 mg/dL    Comment: Glucose reference range applies only to samples taken after fasting for at least 8 hours.  Glucose, capillary     Status: None   Collection Time: 04/29/20  8:24 PM  Result Value Ref Range   Glucose-Capillary 97 70 - 99 mg/dL    Comment: Glucose reference range applies only to samples taken after fasting for at least 8 hours.   Comment 1 Document in Chart   Glucose, capillary     Status: Abnormal   Collection Time: 04/30/20 12:11 AM  Result Value Ref Range   Glucose-Capillary 109 (H) 70 - 99 mg/dL  Comment: Glucose reference range applies only to samples taken after fasting for at least 8 hours.   Comment 1 Document in Chart   Glucose, capillary     Status: Abnormal   Collection Time: 04/30/20  4:05 AM  Result Value Ref Range   Glucose-Capillary 149 (H) 70 - 99 mg/dL    Comment: Glucose reference range applies only to samples taken after fasting for at least 8 hours.   Comment 1 Document in Chart   Glucose, capillary     Status: Abnormal   Collection Time: 04/30/20  7:52 AM  Result Value Ref Range     Glucose-Capillary 111 (H) 70 - 99 mg/dL    Comment: Glucose reference range applies only to samples taken after fasting for at least 8 hours.  Glucose, capillary     Status: Abnormal   Collection Time: 04/30/20  1:14 PM  Result Value Ref Range   Glucose-Capillary 136 (H) 70 - 99 mg/dL    Comment: Glucose reference range applies only to samples taken after fasting for at least 8 hours.   No results found.     Medical Problem List and Plan: 1.  R hemiplegia and impaired function/expressive aphasia and R inattention secondary to L MCA stroke with hemorrhagic extension- with dysphagia- on D2 with thins  -patient may  shower  -ELOS/Goals: 2.5 to 3 weeks- goals Mod I to min A 2.  Antithrombotics: -DVT/anticoagulation:  Pharmaceutical: Lovenox  -antiplatelet therapy:  ASA due to bleed. Discussed with stroke NP--will order CT head on Monday am and contact neurology for input on [facility].  3. Pain Management: Tylenol prn 4. Mood: LCSW to follow for evaluation and support.   -antipsychotic agents: N/a 5. Neuropsych: This patient is not fully capable of making decisions on her  own behalf. 6. Skin/Wound Care: Routine pressure relief measures.  7. Fluids/Electrolytes/Nutrition: Monitor I/O. Check lytes in am.  8. A fib with RVR: Monitor HR bid--continue metoprolol 75 mg bid. HR remains 90-120 range in past 24 hours. NO AC at this time due to bleed.  9. HTN: Monitor BP tid.  On Lopressor 75 mg bid.  10 Dysphagia: On Dysphagia 2, thin liquids. Encourage intake. Will have SLP and reassess swallow per SLP. 11. ETOH abuse: Has not had issues with withdrawal.  12. Prediabetes: Hgb A1c-6.1. Monitor CBGs ac/hs. Add CM restrictions. RD to educate patient/family on CM diet.  13. ABLA: Recheck CBC in am.     Evlyn Kanner. Love, PA-C 04/30/2020   I have personally performed a face to face diagnostic evaluation of this patient and formulated the key components of the plan.  Additionally, I have personally  reviewed laboratory data, imaging studies, as well as relevant notes and concur with the physician assistant's documentation above.   The patient's status has not changed from the original H&P.  Any changes in documentation from the acute care chart have been noted above.     Genice Rouge, MD 04/30/2020

## 2020-04-30 NOTE — Progress Notes (Signed)
°  Speech Language Pathology Treatment: Dysphagia  Patient Details Name: Lindsey [name] MRN: [id number] DOB: 1956/04/30 Today's Date: 04/30/2020 Time: 1220-1230 SLP Time Calculation (min) (ACUTE ONLY): 10 min  Assessment / Plan / Recommendation Clinical Impression  Pt is pleased to be D/Cing to CIR this afternoon.  The RN reports good toleration of breakfast with less coughing.  During our brief session she drank water (no straw) with NO coughing, and intermittent verbal cues to attend to spillage right side of mouth. Spontaneous communication improved today, although we did not work on her speech in a formalized way.  She will benefit from intensive SLP while in rehab for communication and swallowing.  Completed education with pt/grandson, Lindsey [name].   HPI HPI: Pt is a 64 y.o. female admitted 04/21/20 with R weakness, facial droop, and severe dysarthria. CT and MRI brain reveal large L MCA, initially ischemic but with some hemorrhagic conversion and involving left frontal lobe, insula as well as the caudate and putamen. CT scan 7/23 shows evolving large L MCA infarct with stable hemorrhagic transformation but increasing edema and 3 mm left-to-right shift. PMH includes HTN, afib with RVR.      SLP Plan  Discharge SLP treatment due to (comment) (D/C today to CIR)       Recommendations  Diet recommendations: Dysphagia 2 (fine chop);Thin liquid Liquids provided via: Cup Medication Administration: Whole meds with puree Supervision: Staff to assist with self feeding Compensations: Minimize environmental distractions;Slow rate;Small sips/bites;Monitor for anterior loss Postural Changes and/or Swallow Maneuvers: Seated upright 90 degrees                Oral Care Recommendations: Oral care BID SLP Visit Diagnosis: Dysphagia, oral phase (R13.11) Plan: Discharge SLP treatment due to (comment) (D/C today to CIR)       GO                Blenda Mounts Laurice 04/30/2020, 1:12 PM  Marchelle Folks  L. Samson Frederic, MA CCC/SLP Acute Rehabilitation Services Office number [phone] Pager [phone]

## 2020-04-30 NOTE — Progress Notes (Signed)
Inpatient Rehabilitation Admissions Coordinator  Inpatient rehab/Cir bed is available to admit patient to today. I met with patient at bedside and spoke with her daughter, [name], by phone. [name] agrees to [employer] admit today. I have alerted Dr. Sloan Leiter, acute team and [facility] team . I will make the arrangements  to admit today.  Danne Baxter, RN, MSN Rehab Admissions Coordinator [phone] 04/30/2020 11:00 AM

## 2020-04-30 NOTE — Progress Notes (Signed)
Inpatient Rehabilitation Medication Review by a Pharmacist  A complete drug regimen review was completed for this patient to identify any potential clinically significant medication issues.  Clinically significant medication issues were identified:  no  Check AMION for pharmacist assigned to patient if future medication questions/issues arise during this admission.  Pharmacist comments: no medication issues identified - oral anticoagulation appropriately held - aspirin ordered appropriately - discontinued home medications appropriately not reordered  Time spent performing this drug regimen review (minutes):  10 mins  Harlow Mares, PharmD Clinical Pharmacist  04/30/2020   5:20 PM   Please check AMION for all [facility] Pharmacy phone numbers After 10:00 PM, call the Main Pharmacy [phone]

## 2020-04-30 NOTE — Progress Notes (Signed)
Occupational Therapy Treatment Patient Details Name: Lindsey [name] MRN: [id number] DOB: [birth year] Today's Date: 04/30/2020    History of present illness Pt is a 64 y.o. female admitted 04/21/20 with R weakness, facial droop, and severe dysarthria. CT and MRI brain reveal large L MCA, initially ischemic but with some hemorrhagic conversion. CT scan 7/23 shows evolving large L MCA infarct with stable hemorrhagic transformation but increasing edema and 3 mm left-to-right shift. PMH includes HTN, afib with RVR.   OT comments  Patient continues to improve and make progress with therapy.  Able to go supine <> seated EOB with max assist of 1 today.  Patient still requiring multimodal cues for initiating movements, though motor planning and imitating has improved.  Patient able to make a thumbs up with L hand.  Sat EOB 10 min to work on improving core strength and balance to prepare for seated ADLs.  Facilitated weight bearing through R UE.  Practiced UE exercises with self ROM/AAROM.  Will continue to follow with OT acutely to address the deficits listed below.    Follow Up Recommendations  CIR;Supervision/Assistance - 24 hour    Equipment Recommendations  Other (comment)    Recommendations for Other Services      Precautions / Restrictions Precautions Precautions: Fall Precaution Comments: R neglect, R hemi       Mobility Bed Mobility Overal bed mobility: Needs Assistance Bed Mobility: Supine to Sit;Sit to Supine     Supine to sit: HOB elevated;Max assist Sit to supine: Mod assist;HOB elevated   General bed mobility comments: Patient requiring cues for sequencing and moving L side.  She was able to scoot self around and pull up to sit with l arm and help of therapist.  Transfers                      Balance Overall balance assessment: Needs assistance Sitting-balance support: Feet supported;Single extremity supported Sitting balance-Leahy Scale: Poor Sitting balance -  Comments: Ranging min guard- min assist to maintain balance. Facilitating weight bearing through R UE. Postural control: Right lateral lean                                 ADL either performed or assessed with clinical judgement   ADL                                         General ADL Comments: Session focused on scanning, increasing R side attention, and ROM exercises.  Sitting EOB and seated balance to prepare for seated ADLs     Vision       Perception     Praxis      Cognition Arousal/Alertness: Awake/alert Behavior During Therapy: Flat affect Overall Cognitive Status: Difficult to assess Area of Impairment: Attention;Following commands;Problem solving;Safety/judgement;Awareness                   Current Attention Level: Sustained   Following Commands: Follows one step commands inconsistently;Follows one step commands with increased time Safety/Judgement: Decreased awareness of safety;Decreased awareness of deficits Awareness: Intellectual Problem Solving: Slow processing;Decreased initiation;Requires verbal cues;Requires tactile cues General Comments: Requires verbal and tactile cues for attention to R side and initiating tasks.  Motor planning skills are improving, able to make a thumbs up with L hand this session.  Exercises Exercises: General Upper Extremity General Exercises - Upper Extremity Shoulder Flexion: Self ROM;AAROM;Both;10 reps;Seated Shoulder Extension: AAROM;Self ROM;Both;10 reps;Seated Elbow Flexion: Self ROM;20 reps;Both;Seated Elbow Extension: Self ROM;Both;20 reps;Seated Wrist Flexion: Self ROM;Both;10 reps;Seated Wrist Extension: Self ROM;Both;10 reps;Seated Other Exercises Other Exercises: Sat EOB 10 min    Shoulder Instructions       General Comments      Pertinent Vitals/ Pain       Pain Assessment: 0-10 Pain Score: 0-No pain  Home Living                                           Prior Functioning/Environment              Frequency  Min 2X/week        Progress Toward Goals  OT Goals(current goals can now be found in the care plan section)  Progress towards OT goals: Progressing toward goals  Acute Rehab OT Goals Patient Stated Goal: No goal stated but wanting to do better OT Goal Formulation: With patient/family Time For Goal Achievement: 05/06/20 Potential to Achieve Goals: Good  Plan Discharge plan remains appropriate    Co-evaluation                 AM-PAC OT "6 Clicks" Daily Activity     Outcome Measure   Help from another person eating meals?: Total Help from another person taking care of personal grooming?: A Lot Help from another person toileting, which includes using toliet, bedpan, or urinal?: Total Help from another person bathing (including washing, rinsing, drying)?: Total Help from another person to put on and taking off regular upper body clothing?: A Lot Help from another person to put on and taking off regular lower body clothing?: Total 6 Click Score: 8    End of Session    OT Visit Diagnosis: Unsteadiness on feet (R26.81);Other abnormalities of gait and mobility (R26.89);Muscle weakness (generalized) (M62.81);Other symptoms and signs involving the nervous system (R29.898);Other symptoms and signs involving cognitive function;Feeding difficulties (R63.3);Hemiplegia and hemiparesis;Cognitive communication deficit (R41.841) Symptoms and signs involving cognitive functions: Cerebral infarction Hemiplegia - Right/Left: Right Hemiplegia - dominant/non-dominant: Dominant Hemiplegia - caused by: Cerebral infarction   Activity Tolerance Patient tolerated treatment well   Patient Left in bed;with bed alarm set;with call bell/phone within reach;with nursing/sitter in room   Nurse Communication Mobility status        Time: 1610-9604 OT Time Calculation (min): 18 min  Charges: OT General Charges $OT  Visit: 1 Visit OT Treatments $Therapeutic Exercise: 8-22 mins  Barbie Banner, OTR/L    Adella Hare 04/30/2020, 9:30 AM

## 2020-04-30 NOTE — PMR Pre-admission (Signed)
PMR Admission Coordinator Pre-Admission Assessment  Patient: Lindsey [name] is an 64 y.o., female MRN: [id number] DOB: Nov 18, 1955 Height:   Weight: 77.9 kg            Insurance Information  PRIMARY: uninsured      [name] has applied for [employer] and Disability with [place] [facility] and [facility] with assistance of Loss adjuster, chartered:       Phone#:   The Data processing manager" for patients in Inpatient Rehabilitation Facilities with attached "Privacy Act Statement-Health Care Records" was provided and verbally reviewed with: N/A  Emergency Contact Information Contact Information    Name Relation Home Work Mobile   Gr[place]ill Daughter [phone]  [phone]     Current Medical History  Patient Admitting Diagnosis: CVA  History of Present Illness: 64 year old female with medical history for hypertension and alcohol use. Presented on 04/21/2020 with concerns of right sided weakness and slurred speech.Patient dysarthric/aphasic with left sided gaze and right sided hemiparesis. Patient in atrial fibrillation with RVR.   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 There was also noted acute occlusion to th left M2 on CTA head and neck . Neurology consulted. On 7/24 repeat CT head with evolving left MCA infarct with increasing edema and increased rightward shift, small hemorrhages within the infarct without significant progression. Echo EF 60 to 65%.   Neurology recommended to continue ASA. Dysphagia initially with Cortrak, evaluated by SLP and upgraded to Dysphagia 2 diet. Dietician consulted and recommending supplements.   Afib with RVR and continues to have intermittent RVR. Cardiology consulted and adjusted beta blockade dosage. Currently not a candidate for anticoagulation due to hemorrhagic transformation of CVA. HTN with BP stable and to continue  metoprolol. Continue statin.  Stroke MD on call 05/08/2020 to reassess whether a repeat CT head is needed before considering initiation of anticoagulation.   Complete NIHSS TOTAL: 20 Glasgow Coma Scale Score: 15  Past Medical History  Past Medical History:  Diagnosis Date  . Diverticulosis   . Hypertension   . Uterine prolapse     Family History  family history is not on file.  Prior Rehab/Hospitalizations:  Has the patient had prior rehab or hospitalizations prior to admission? Yes  Has the patient had major surgery during 100 days prior to admission? No  Current Medications   Current Facility-Administered Medications:  .  acetaminophen (TYLENOL) tablet 650 mg, 650 mg, Oral, Q4H PRN, 650 mg at 04/30/20 1014 **OR** acetaminophen (TYLENOL) 160 MG/5ML solution 650 mg, 650 mg, Per Tube, Q4H PRN **OR** acetaminophen (TYLENOL) suppository 650 mg, 650 mg, Rectal, Q4H PRN, Tu, Ching T, DO .  aspirin suppository 300 mg, 300 mg, Rectal, Daily, 300 mg at 04/22/20 1021 **OR** aspirin chewable tablet 81 mg, 81 mg, Per Tube, Daily, Biby, Sharon L, NP, 81 mg at 04/30/20 0912 .  atorvastatin (LIPITOR) tablet 40 mg, 40 mg, Per Tube, q1800, Maretta Bees, MD, 40 mg at 04/29/20 1816 .  chlorhexidine (PERIDEX) 0.12 % solution 15 mL, 15 mL, Mouth Rinse, BID, Ghimire, Werner Lean, MD, 15 mL at 04/30/20 0913 .  enoxaparin (LOVENOX) injection 40 mg, 40 mg, Subcutaneous, Q24H, Biby, Sharon L, NP, 40 mg at 04/30/20 1014 .  feeding supplement (ENSURE ENLIVE) (ENSURE ENLIVE) liquid 237 mL, 237 mL, Oral, TID BM, Ghimire, Werner Lean, MD, 237 mL at 04/30/20 1014 .  MEDLINE mouth  rinse, 15 mL, Mouth Rinse, q12n4p, Ghimire, Werner LeanShanker M, MD, 15 mL at 04/28/20 1504 .  metoprolol tartrate (LOPRESSOR) tablet 75 mg, 75 mg, Oral, BID, Croitoru, Mihai, MD, 75 mg at 04/30/20 0912 .  Resource ThickenUp Clear, , Oral, PRN, Ghimire, Werner LeanShanker M, MD  Patients Current Diet:  Diet Order            Diet - low sodium heart  healthy           DIET DYS 2 Room service appropriate? Yes with Assist; Fluid consistency: Thin  Diet effective now                Precautions / Restrictions Precautions Precautions: Fall Precaution Comments: R neglect, R hemi Restrictions Weight Bearing Restrictions: No   Has the patient had 2 or more falls or a fall with injury in the past year?No  Prior Activity Level Community (5-7x/wk): independent  Prior Functional Level Prior Function Level of Independence: Independent Comments: driving  Self Care: Did the patient need help bathing, dressing, using the toilet or eating?  Independent  Indoor Mobility: Did the patient need assistance with walking from room to room (with or without device)? Independent  Stairs: Did the patient need assistance with internal or external stairs (with or without device)? Independent  Functional Cognition: Did the patient need help planning regular tasks such as shopping or remembering to take medications? Independent  Home Assistive Devices / Equipment Home Equipment: None  Prior Device Use: Indicate devices/aids used by the patient prior to current illness, exacerbation or injury? None of the above  Current Functional Level Cognition  Arousal/Alertness: Awake/alert Overall Cognitive Status: Difficult to assess Difficult to assess due to: Impaired communication Current Attention Level: Sustained Orientation Level: Oriented to person, Oriented to place, Disoriented to time, Disoriented to situation, Other (comment) (pt has a hard time communicating) Following Commands: Follows one step commands inconsistently, Follows one step commands with increased time Safety/Judgement: Decreased awareness of safety, Decreased awareness of deficits General Comments: Requires verbal and tactile cues for attention to R side and initiating tasks.  Motor planning skills are improving, able to make a thumbs up with L hand this session.   Memory:  (did  not test due to pt's language deficits)    Extremity Assessment (includes Sensation/Coordination)  Upper Extremity Assessment: RUE deficits/detail, LUE deficits/detail RUE Deficits / Details: Flaccid, though sensation is improving.   RUE Sensation: decreased light touch, decreased proprioception RUE Coordination: decreased fine motor, decreased gross motor LUE Coordination: decreased fine motor  Lower Extremity Assessment: Defer to PT evaluation RLE Deficits / Details: mild hypertonicty in hip/knee flexion, no active muscle contraction appreciated. + withdrawal to painful stimuli RLE Sensation: decreased light touch    ADLs  Overall ADL's : Needs assistance/impaired Grooming: Wash/dry face, Set up, Sitting Grooming Details (indicate cue type and reason): Using L hand, sitting EOB. Sitting required min-max support  Toileting- Clothing Manipulation and Hygiene: Total assistance, Sit to/from stand Toileting - Clothing Manipulation Details (indicate cue type and reason): Stood using stedy and support from PT. Functional mobility during ADLs: Moderate assistance, Maximal assistance, +2 for physical assistance General ADL Comments: Session focused on scanning, increasing R side attention, and ROM exercises.  Sitting EOB and seated balance to prepare for seated ADLs    Mobility  Overal bed mobility: Needs Assistance Bed Mobility: Supine to Sit, Sit to Supine Rolling: Mod assist, Supervision (mod to left; supervision to right) Sidelying to sit: Mod assist (up from rt side due to  room setup) Supine to sit: HOB elevated, Max assist Sit to supine: Mod assist, HOB elevated General bed mobility comments: Patient requiring cues for sequencing and moving L side.  She was able to scoot self around and pull up to sit with l arm and help of therapist.    Transfers  Overall transfer level: Needs assistance Equipment used: Ambulation equipment used Transfer via Lift Equipment: Stedy Transfers: Sit  to/from Stand Sit to Stand: Min assist, +2 physical assistance, From elevated surface, +2 safety/equipment General transfer comment: Min +2 for power up, steadying, and correcting heavy R lateral leaning. STS x1 from EOB, and x3 from stedy; x2from recliner. Patient incontinent of urine in standing requiring incr time for clean up/clean linends    Ambulation / Gait / Stairs / Wheelchair Mobility  Ambulation/Gait General Gait Details: unable    Posture / Balance Dynamic Sitting Balance Sitting balance - Comments: Ranging min guard- min assist to maintain balance. Facilitating weight bearing through R UE. Balance Overall balance assessment: Needs assistance Sitting-balance support: Feet supported, Single extremity supported Sitting balance-Leahy Scale: Poor Sitting balance - Comments: Ranging min guard- min assist to maintain balance. Facilitating weight bearing through R UE. Postural control: Right lateral lean Standing balance support: Single extremity supported Standing balance-Leahy Scale: Zero Standing balance comment: Using stedy and assist of 2    Special needs/care consideration Designated visitors are daughter, [name] and Lorna Few.    Previous Home Environment  Living Arrangements:  (daughter, [name])  Lives With: Daughter Available Help at Discharge: Family, Available 24 hours/day ([name], grandson, [name], and other family members from [place]) Type of Home: House Home Layout: Multi-level, Able to live on main level with bedroom/bathroom, Other (Comment), 1/2 bath on main level (currently on second floor but can move) Alternate Level Stairs-Number of Steps: flight Home Access: Level entry Bathroom Shower/Tub: Walk-in shower (upstairs, only half bath downstairs) Firefighter:  (1/2 bath downstairs; full bath upstairs) Bathroom Accessibility: Yes How Accessible: Accessible via walker Home Care Services: No  Discharge Living Setting Plans for Discharge Living  Setting: Lives with (comment) (daughter, [name]) Type of Home at Discharge: House Discharge Home Layout: Two level, 1/2 bath on main level, Able to live on main level with bedroom/bathroom Alternate Level Stairs-Number of Steps: flight Discharge Home Access: Level entry Discharge Bathroom Shower/Tub: Walk-in shower (upstairs; 1/2 bath downstairs) Discharge Bathroom Toilet: Standard Discharge Bathroom Accessibility: Yes How Accessible: Accessible via walker Does the patient have any problems obtaining your medications?: Yes (Describe) (uninsured)  Social/Family/Support Systems Patient Roles: Parent Contact Information: Geologist, engineering, daughter Anticipated Caregiver: [name], daughter and grandson, Lenon Curt, as well as other family from [place]  Anticipated Caregiver's Contact Information: [phone] Ability/Limitations of Caregiver: [name] works third shift Caregiver Availability: 24/7 Discharge Plan Discussed with Primary Caregiver: Yes Is Caregiver In Agreement with Plan?: Yes Does Caregiver/Family have Issues with Lodging/Transportation while Pt is in Rehab?: No  Goals Patient/Family Goal for Rehab: min assist with PT, OT and SLP Expected length of stay: ELOS 2 to 3 weeks Additional Information: Daughter has requested other visiotrs from [place] to visit August 12,13 and 14 (Pt's Mom and 5 sisters. Dr. Berline Chough has stated pt's Mom can  ) Pt/Family Agrees to Admission and willing to participate: Yes Program Orientation Provided & Reviewed with Pt/Caregiver Including Roles  & Responsibilities: Yes Additional Information Needs: Dr. Berline Chough has stated that pt's MOm can visit, but not the additional 5 sisters from [place] due to COVID restricitions  Barriers to Discharge: Insurance for SNF coverage  Decrease burden of Care through IP rehab admission: n/a  Possible need for SNF placement upon discharge:not anticipated  Patient Condition: This patient's medical and functional status has changed since  the consult dated 04/23/2020 in which the Rehabilitation Physician determined and documented that the patient was potentially appropriate for intensive rehabilitative care in an inpatient rehabilitation facility. Issues have been addressed and update has been discussed with Dr. Berline Chough and patient now appropriate for inpatient rehabilitation. Will admit to inpatient rehab today.   Preadmission Screen Completed By:  Clois Dupes, RN, 04/30/2020 11:34 AM ______________________________________________________________________   Discussed status with Dr. Berline Chough on 04/30/2020 at 1135 and received approval for admission today.  Admission Coordinator:  Clois Dupes, time [id number] Date 04/30/2020

## 2020-04-30 NOTE — Progress Notes (Signed)
Genice Rouge, MD  Physician  Physical Medicine and Rehabilitation  Consult Note     Signed  Date of Service:  04/22/2020  5:48 PM      Related encounter: ED to Hosp-Admission (Current) from 04/21/2020 in [facility] 5W [facility] PCU      Signed      Expand All Collapse All  Show:Clear all [x] Manual[x] Template[] Copied  Added by: [x] Love, , PA-C[x] Lovorn, , MD  [] Hover for details          Physical Medicine and Rehabilitation Consult   Reason for Consult: Stroke with functional deficits.  Referring Physician: Dr. Evlyn Kanner     HPI: Lindsey [name] is a 64 y.o. female with history of HTN and alcohol use who was admitted on 04/21/20 with right sided weakness and slurred speech.  Last seen normal the evening before and noted to have slurred speech that am contributed to intoxicated state and admitted later in evening. She was found to have dysarthria, aphasia, left gaze preference and right hemiparesis. She was in Afib with RVR at admission and UDS positive for THC.  CT head showed large L-MCA infarct and CTA revealed left M2 occlusion but patient out of window for thrombolysis. Follow up MRI brain later that day showed acute L-MCA infarct with mild hemorrhage not seen on CT earlier.  2D echo showed EF 60-65% with mild LVH, mild RA dilation and no wall abnormality. BLE dopplers negative for DVT.    Follow up CT head showed evolving infract with increase in cytotoxic edema and partial effacement of left lateral ventricle and stable areas of hemorrhage.  Stroke felt to be embolic in setting of A fib and ASA added on 07/21. Cardiology consulted to help manage tachycardia and IV metoprolol added for rate control. Swallow eval showed moderate to severe oral dysphagia due to motor planning deficits and NPO recommended.  Therapy evaluations completed revealing left gaze preference with inability to scan to the right, right inattention, right sided weakness with sensory deficits  and emerging tone. CIR recommended due to functional decline.      Daughter and grandson in room- pt working with PT - first time for this PT-  Was NPO, but diet just updated to D1 puree with nectar thick liquids-  Per PT, pt is already pushing and also has spasticity in RLE noted.    Review of Systems  Unable to perform ROS: Patient nonverbal            Past Medical History:  Diagnosis Date  . Diverticulosis    . Hypertension    . Uterine prolapse        History reviewed. No pertinent surgical history.           Family History  Problem Relation Age of Onset  . Breast cancer Neg Hx        Social History:  Lives with family. reports that she quit smoking about 41 years ago. Her smoking use included cigarettes. She smoked 0.25 packs per day. She has never used smokeless tobacco. She reports current alcohol use. She reports that she does not use drugs.      Allergies: No Known Allergies            Medications Prior to Admission  Medication Sig Dispense Refill  . amLODipine (NORVASC) 10 MG tablet Take 10 mg by mouth daily.      77 atenolol (TENORMIN) 25 MG tablet Take 25 mg by mouth daily.      . diazepam (  VALIUM) 2 MG tablet Take 1 tablet (2 mg total) by mouth every 8 (eight) hours as needed. (Patient taking differently: Take 2 mg by mouth every 8 (eight) hours as needed for muscle spasms. ) 12 tablet 0  . diphenhydrAMINE (BENADRYL) 25 MG tablet Take 25 mg by mouth every 6 (six) hours as needed for itching.       . hydrOXYzine (ATARAX/VISTARIL) 25 MG tablet Take 25 mg by mouth 3 (three) times daily as needed for itching.       . lidocaine (LIDODERM) 5 % Place 1 patch onto the skin daily. Remove & Discard patch within 12 hours or as directed by MD      . naproxen (NAPROSYN) 500 MG tablet Take 1 tablet (500 mg total) by mouth 2 (two) times daily with a meal. 30 tablet 0  . oxyCODONE-acetaminophen (PERCOCET) 5-325 MG tablet Take 1 tablet by mouth every 6 (six) hours as needed  for severe pain. 20 tablet 0  . cetirizine (ZYRTEC) 10 MG tablet Take 1 tablet (10 mg total) by mouth daily for 7 days. 7 tablet 0  . hydrOXYzine (ATARAX/VISTARIL) 25 MG tablet Take 1 tablet (25 mg total) by mouth 3 (three) times daily as needed. (Patient not taking: Reported on 04/21/2020) 30 tablet 0  . sulfamethoxazole-trimethoprim (BACTRIM DS,SEPTRA DS) 800-160 MG tablet Take 1 tablet by mouth 2 (two) times daily. (Patient not taking: Reported on 04/21/2020) 20 tablet 0      Home: Home Living Family/patient expects to be discharged to:: Private residence Living Arrangements: Children Available Help at Discharge: Family, Available PRN/intermittently (daughter works nights, 5pm-1:30.May have other familyto help) Type of Home: House Home Access: Level entry Home Layout: Multi-level, Able to live on main level with bedroom/bathroom, Other (Comment), 1/2 bath on main level (currently on second floor but can move) Alternate Level Stairs-Number of Steps: flight Bathroom Shower/Tub: Walk-in shower (upstairs, only half bath downstairs) Firefighter: Standard Home Equipment: None  Lives With: Daughter  Functional History: Prior Function Level of Independence: Independent Comments: driving Functional Status:  Mobility: Bed Mobility Overal bed mobility: Needs Assistance Bed Mobility: Rolling, Sidelying to Sit, Sit to Supine Rolling: Mod assist, Max assist Sidelying to sit: Max assist, +2 for physical assistance, +2 for safety/equipment, HOB elevated Sit to supine: Max assist, +2 for safety/equipment, +2 for physical assistance, HOB elevated General bed mobility comments: Patient able to initiate movements with L UE.  Rolling better to R side. Requiring total assist with movement or R UE/LE Transfers General transfer comment: not assessed this day   ADL: ADL Overall ADL's : Needs assistance/impaired Functional mobility during ADLs: +2 for physical assistance, Maximal assistance, Total  assistance General ADL Comments: Patient total assist for all ADLs at this time due to poor command following, motor planning, and R side weakness.   Cognition: Cognition Overall Cognitive Status: Difficult to assess Arousal/Alertness: Awake/alert Orientation Level: Other (comment) (gross dysarthria and aphasia) Memory:  (did not test due to pt's language deficits) Cognition Arousal/Alertness: Awake/alert, Lethargic (periods of lethargy, improved with sitting EOB) Behavior During Therapy: WFL for tasks assessed/performed Overall Cognitive Status: Difficult to assess Area of Impairment: Following commands, Safety/judgement, Awareness, Problem solving Following Commands: Follows one step commands inconsistently, Follows one step commands with increased time Safety/Judgement: Decreased awareness of deficits, Decreased awareness of safety Awareness: Intellectual Problem Solving: Slow processing, Difficulty sequencing, Decreased initiation, Requires verbal cues, Requires tactile cues General Comments: Patient with significant R side neglect and poor motor planning.  Not able to  imitate movements (asked to give thumbs up and did more of a waving motion with digits 2-5 moving up and down).  Patient able to follow some simple commands.  Answering yes/no, attempting other words but speech very dysarthric/slurred. Difficult to assess due to: Impaired communication     Blood pressure (!) 139/96, pulse (!) 114, temperature 98 F (36.7 C), temperature source Oral, resp. rate 18, weight 75.8 kg, SpO2 100 %. Physical Exam Vitals and nursing note reviewed.  Constitutional:      Appearance: Normal appearance.     Comments: [place] in nares. Fatigued appearing.   Sitting up with 2 PT's assistance, daughter and grandson also in room, NAD  HENT:     Head: Normocephalic and atraumatic.     Comments: Severe R facial droop Tongue deviated R     Right Ear: External ear normal.     Left Ear: External ear  normal.     Nose: Nose normal. No congestion or rhinorrhea.     Mouth/Throat:     Mouth: Mucous membranes are dry.     Pharynx: Oropharynx is clear. No oropharyngeal exudate.  Eyes:     Comments: L gaze preference- unable to cross midline to look R- unable to see nystagmus  Neck:     Comments: Kept head slightly tilted Cardiovascular:     Rate and Rhythm: Tachycardia present. Rhythm irregular.     Comments: Heart rate up to 130's with minimal activity  Also up into 120s-130s with sitting /scotting to EOB with PT; irregular rhythm Pulmonary:     Comments: CTA B/L- no W/R/R- good air movement- occ juicy cough, but clear when listens Abdominal:     Comments: Soft, protuberant, ND, NT, hypoactive BS  Musculoskeletal:     Cervical back: Normal range of motion.     Comments: Very apraxic on LUE/LLE Held RUE limply- 0/5 RLE- was with R hip externally rotated and foot supinated slightly- even though strength 0/5 in RLE  Skin:    Comments: No skin breakdown seen  Neurological:     Mental Status: She is alert.     Comments: Right facial weakness. Aphonic and globally aphasic. Tends to nod yes to all questions--she was able to recognise her name with choice of two. She thought that she was at home despite multiple cues. Unable to point to items.  Apraxic- able to move LUE and LLE with verbal and tactile cues. Does not attend to right and right sided weakness noted. She was unable to move eye to right field.  Apraxic on L Per pt whispering/dyarthria severe- hard to understand- said ~ 10 words total- pt said sensation to light touch in RUE/RLE was nml- not sure if it is or not.  MAS of 1+ in R hip and R ankle and knee- very early for tone No clonus/equivocial hoffman's RUE  Psychiatric:     Comments: Flat- said ~ 10 words- dysarthric- hard to gauge        Lab Results Last 24 Hours        Results for orders placed or performed during the hospital encounter of 04/21/20 (from the past 24  hour(s))  SARS Coronavirus 2 by RT PCR (hospital order, performed in [facility] hospital lab) Nasopharyngeal Nasopharyngeal Swab     Status: None    Collection Time: 04/21/20  6:51 PM    Specimen: Nasopharyngeal Swab  Result Value Ref Range    SARS Coronavirus 2 NEGATIVE NEGATIVE  Urine rapid drug screen (hosp performed)  Status: Abnormal    Collection Time: 04/21/20  8:00 PM  Result Value Ref Range    Opiates NONE DETECTED NONE DETECTED    Cocaine NONE DETECTED NONE DETECTED    Benzodiazepines NONE DETECTED NONE DETECTED    Amphetamines NONE DETECTED NONE DETECTED    Tetrahydrocannabinol POSITIVE (A) NONE DETECTED    Barbiturates NONE DETECTED NONE DETECTED  Urinalysis, Routine w reflex microscopic     Status: Abnormal    Collection Time: 04/21/20  8:00 PM  Result Value Ref Range    Color, Urine YELLOW YELLOW    APPearance CLEAR CLEAR    Specific Gravity, Urine 1.042 (H) 1.005 - 1.030    pH 6.0 5.0 - 8.0    Glucose, UA NEGATIVE NEGATIVE mg/dL    Hgb urine dipstick NEGATIVE NEGATIVE    Bilirubin Urine NEGATIVE NEGATIVE    Ketones, ur 20 (A) NEGATIVE mg/dL    Protein, ur NEGATIVE NEGATIVE mg/dL    Nitrite NEGATIVE NEGATIVE    Leukocytes,Ua NEGATIVE NEGATIVE  Culture, blood (Routine X 2) w Reflex to ID Panel     Status: None (Preliminary result)    Collection Time: 04/21/20  8:25 PM    Specimen: BLOOD RIGHT HAND  Result Value Ref Range    Specimen Description BLOOD RIGHT HAND      Special Requests          BOTTLES DRAWN AEROBIC AND ANAEROBIC Blood Culture adequate volume    Culture          NO GROWTH < 12 HOURS Performed at [facility], [address] [place], [place] [address]      Report Status PENDING    HIV Antibody (routine testing w rflx)     Status: None    Collection Time: 04/21/20 11:33 PM  Result Value Ref Range    HIV Screen 4th Generation wRfx Non Reactive Non Reactive  MRSA PCR Screening     Status: None    Collection Time: 04/22/20  1:17 AM     Specimen: Nasal Mucosa; Nasopharyngeal  Result Value Ref Range    MRSA by PCR NEGATIVE NEGATIVE  Hemoglobin A1c     Status: Abnormal    Collection Time: 04/22/20  3:38 AM  Result Value Ref Range    Hgb A1c MFr Bld 6.1 (H) 4.8 - 5.6 %    Mean Plasma Glucose 128.37 mg/dL  Lipid panel     Status: Abnormal    Collection Time: 04/22/20  3:38 AM  Result Value Ref Range    Cholesterol 203 (H) 0 - 200 mg/dL    Triglycerides 69 <604<150 mg/dL    HDL 62 >54>40 mg/dL    Total CHOL/HDL Ratio 3.3 RATIO    VLDL 14 0 - 40 mg/dL    LDL Cholesterol 098127 (H) 0 - 99 mg/dL       Imaging Results (Last 48 hours)  CT Angio Head W or Wo Contrast   Result Date: 04/21/2020 CLINICAL DATA:  Stroke.  Slurred speech EXAM: CT ANGIOGRAPHY HEAD AND NECK TECHNIQUE: Multidetector CT imaging of the head and neck was performed using the standard protocol during bolus administration of intravenous contrast. Multiplanar CT image reconstructions and MIPs were obtained to evaluate the vascular anatomy. Carotid stenosis measurements (when applicable) are obtained utilizing NASCET criteria, using the distal internal carotid diameter as the denominator. CONTRAST:  75mL OMNIPAQUE IOHEXOL 350 MG/ML SOLN COMPARISON:  CT head 04/21/2020 FINDINGS: CTA NECK FINDINGS Aortic arch: Mild atherosclerotic calcification aortic arch. Bovine  branching pattern. Proximal great vessels patent. Right carotid system: Right carotid widely patent without stenosis. Minimal atherosclerotic calcification right carotid bulb. Medial deviation of the carotid bifurcation and internal carotid artery posterior to the pharynx. Left carotid system: Left carotid widely patent with mild atherosclerotic calcification at the bifurcation. Medial deviation of the carotid bifurcation internal carotid artery in the retropharyngeal space. Vertebral arteries: Left vertebral artery dominant. Left vertebral artery patent to the basilar without stenosis. Small right vertebral artery  without stenosis. Skeleton: No acute skeletal abnormality. Other neck: Negative for mass or adenopathy in the neck. Upper chest: Lung apices clear bilaterally. Superior mediastinum negative. Review of the MIP images confirms the above findings CTA HEAD FINDINGS Anterior circulation: Internal carotid artery widely patent bilaterally. Left M1 segment widely patent. There is occlusion of the proximal left M2 inferior division which appears acute. This corresponds to low-density on CT compatible with acute infarct. Superior division left MCA patent Both anterior cerebral arteries widely patent. Right middle cerebral artery and branches widely patent. Posterior circulation: Both vertebral arteries patent to the basilar. PICA patent bilaterally. Basilar widely patent. Superior cerebellar and posterior cerebral arteries patent bilaterally without stenosis or occlusion. Venous sinuses: Early contrast enhancement of the dural sinuses not well evaluated. Anatomic variants: None Review of the MIP images confirms the above findings IMPRESSION: 1. 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 Acute occlusion inferior division left M2  . 3. No other intracranial stenosis 4. No extracranial stenosis in the carotid or vertebral arteries. 5. These results were called by telephone at the time of interpretation on 04/21/2020 at 6:59 pm to provider JOSHUA LONG , who verbally acknowledged these results. Electronically Signed   By: Marlan Palau M.D.   On: 04/21/2020 19:00    CT HEAD WO CONTRAST   Result Date: 04/22/2020 CLINICAL DATA:  Stroke follow-up. EXAM: CT HEAD WITHOUT CONTRAST TECHNIQUE: Contiguous axial images were obtained from the base of the skull through the vertex without intravenous contrast. COMPARISON:  Head CT, CTA, and MRI 04/21/2020 FINDINGS: Brain: An evolving moderately large acute left MCA infarct is similar in extent to the prior MRI with increasing cytotoxic edema. There is partial effacement of the left lateral  ventricle with at most trace midline shift. Multiple small areas of hemorrhage within the infarct are similar to the MRI. There is no extra-axial fluid collection. There is a background of mild chronic small vessel ischemia in the cerebral white matter bilaterally. Vascular: Calcified atherosclerosis at the skull base. Hyperdense left MCA branch vessel corresponding to the M2 occlusion on CTA. Skull: No fracture or suspicious osseous lesion. Sinuses/Orbits: Opacification of a single right ethmoid air cell. Clear mastoid air cells. Unremarkable orbits. Other: None. IMPRESSION: Evolving left MCA infarct with unchanged small areas of hemorrhage. Electronically Signed   By: Sebastian Ache M.D.   On: 04/22/2020 07:47    CT Head Wo Contrast   Result Date: 04/21/2020 CLINICAL DATA:  Stroke suspected, slurred speech, right-sided weakness and facial droop EXAM: CT HEAD WITHOUT CONTRAST TECHNIQUE: Contiguous axial images were obtained from the base of the skull through the vertex without intravenous contrast. COMPARISON:  None. FINDINGS: Brain: There is a hypodensity of the anterior left MCA territory centered about the insula and frontal operculum, involving the left basal ganglia, particularly the putamen and internal capsule, as well as the high left MCA territory cortex. There is no involvement of the temporal lobe. No evidence of hemorrhage. There is mild mass effect on the left lateral ventricle  with approximately 3 mm left right midline shift. Vascular: Subtly hyperdense M2 segmental vessel in the left insula (series 2, image 12). Skull: Normal. Negative for fracture or focal lesion. Sinuses/Orbits: No acute finding. Other: None. IMPRESSION: 1. There is a large hypodensity of the anterior left MCA territory, findings consistent with acute to subacute left MCA territory infarction. ASPECTS 4. 2.  No evidence of hemorrhage. 3. There is mild mass effect on the left lateral ventricle with approximately 3 mm left right  midline shift. Findings reported by telephone to Dr. Jacqulyn Bath at the time of interpretation, 6:48 p.m., 04/21/2020 Electronically Signed   By: Lauralyn Primes M.D.   On: 04/21/2020 18:53    CT Angio Neck W and/or Wo Contrast   Result Date: 04/21/2020 CLINICAL DATA:  Stroke.  Slurred speech EXAM: CT ANGIOGRAPHY HEAD AND NECK TECHNIQUE: Multidetector CT imaging of the head and neck was performed using the standard protocol during bolus administration of intravenous contrast. Multiplanar CT image reconstructions and MIPs were obtained to evaluate the vascular anatomy. Carotid stenosis measurements (when applicable) are obtained utilizing NASCET criteria, using the distal internal carotid diameter as the denominator. CONTRAST:  107mL OMNIPAQUE IOHEXOL 350 MG/ML SOLN COMPARISON:  CT head 04/21/2020 FINDINGS: CTA NECK FINDINGS Aortic arch: Mild atherosclerotic calcification aortic arch. Bovine branching pattern. Proximal great vessels patent. Right carotid system: Right carotid widely patent without stenosis. Minimal atherosclerotic calcification right carotid bulb. Medial deviation of the carotid bifurcation and internal carotid artery posterior to the pharynx. Left carotid system: Left carotid widely patent with mild atherosclerotic calcification at the bifurcation. Medial deviation of the carotid bifurcation internal carotid artery in the retropharyngeal space. Vertebral arteries: Left vertebral artery dominant. Left vertebral artery patent to the basilar without stenosis. Small right vertebral artery without stenosis. Skeleton: No acute skeletal abnormality. Other neck: Negative for mass or adenopathy in the neck. Upper chest: Lung apices clear bilaterally. Superior mediastinum negative. Review of the MIP images confirms the above findings CTA HEAD FINDINGS Anterior circulation: Internal carotid artery widely patent bilaterally. Left M1 segment widely patent. There is occlusion of the proximal left M2 inferior division  which appears acute. This corresponds to low-density on CT compatible with acute infarct. Superior division left MCA patent Both anterior cerebral arteries widely patent. Right middle cerebral artery and branches widely patent. Posterior circulation: Both vertebral arteries patent to the basilar. PICA patent bilaterally. Basilar widely patent. Superior cerebellar and posterior cerebral arteries patent bilaterally without stenosis or occlusion. Venous sinuses: Early contrast enhancement of the dural sinuses not well evaluated. Anatomic variants: None Review of the MIP images confirms the above findings IMPRESSION: 1. 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 Acute occlusion inferior division left M2  . 3. No other intracranial stenosis 4. No extracranial stenosis in the carotid or vertebral arteries. 5. These results were called by telephone at the time of interpretation on 04/21/2020 at 6:59 pm to provider JOSHUA LONG , who verbally acknowledged these results. Electronically Signed   By: Marlan Palau M.D.   On: 04/21/2020 19:00    MR BRAIN WO CONTRAST   Result Date: 04/21/2020 CLINICAL DATA:  Stroke slurred speech EXAM: MRI HEAD WITHOUT CONTRAST TECHNIQUE: Multiplanar, multiecho pulse sequences of the brain and surrounding structures were obtained without intravenous contrast. COMPARISON:  CT angio head neck 05/01/2020 FINDINGS: Brain: Acute infarct left MCA territory. Infarct involves the left frontal lobe, insula as well as the caudate and putamen. There are areas of hemorrhage within the caudate and putamen as  well as in the left frontal lobe. These are not identified on CT. Scattered small white matter hyperintensities elsewhere compatible with chronic microvascular ischemia. Ventricle size normal. Negative for mass lesion. Vascular: Normal arterial flow voids Skull and upper cervical spine: No focal skeletal lesion Sinuses/Orbits: Negative Other: None IMPRESSION: Acute infarct left MCA territory there is  mild hemorrhage in the infarct which is not seen on recent CT earlier today. These results were called by telephone at the time of interpretation on 04/21/2020 at 9:52 pm to provider CHING TU , who verbally acknowledged these results. Electronically Signed   By: Marlan Palau M.D.   On: 04/21/2020 21:53    ECHOCARDIOGRAM COMPLETE   Result Date: 04/22/2020    ECHOCARDIOGRAM REPORT   Patient Name:   Guerry Bruin Date of Exam: 04/22/2020 Medical Rec #:  [id number]     Height:       65.0 in Accession #:    [id number]    Weight:       167.1 lb Date of Birth:  [birth year]     BSA:          1.833 m Patient Age:    64 years      BP:           115/88 mmHg Patient Gender: F             HR:           114 bpm. Exam Location:  Inpatient Procedure: 2D Echo, Cardiac Doppler and Color Doppler Indications:    Stroke  History:        Patient has no prior history of Echocardiogram examinations.                 Stroke, Arrythmias:Atrial Fibrillation; Risk                 Factors:Hypertension. ETOH.  Sonographer:    Sheralyn Boatman RDCS Referring Phys: [id number] CHING T TU  Sonographer Comments: Technically difficult study due to poor echo windows. Poor sound wave transmission. IMPRESSIONS  1. Left ventricular ejection fraction, by estimation, is 60 to 65%. The left ventricle has normal function. The left ventricle has no regional wall motion abnormalities. There is mild left ventricular hypertrophy. Left ventricular diastolic parameters are indeterminate.  2. Right ventricular systolic function is normal. The right ventricular size is normal. There is normal pulmonary artery systolic pressure. The estimated right ventricular systolic pressure is 27.6 mmHg.  3. Left atrial size was mild to moderately dilated.  4. Right atrial size was mildly dilated.  5. The mitral valve is normal in structure. No evidence of mitral valve regurgitation. No evidence of mitral stenosis.  6. The aortic valve is tricuspid. Aortic valve regurgitation is not  visualized. No aortic stenosis is present.  7. The inferior vena cava is normal in size with greater than 50% respiratory variability, suggesting right atrial pressure of 3 mmHg. FINDINGS  Left Ventricle: Left ventricular ejection fraction, by estimation, is 60 to 65%. The left ventricle has normal function. The left ventricle has no regional wall motion abnormalities. The left ventricular internal cavity size was normal in size. There is  mild left ventricular hypertrophy. Left ventricular diastolic parameters are indeterminate. Right Ventricle: The right ventricular size is normal. No increase in right ventricular wall thickness. Right ventricular systolic function is normal. There is normal pulmonary artery systolic pressure. The tricuspid regurgitant velocity is 2.48 m/s, and  with an assumed right atrial pressure of 3 mmHg, the estimated  right ventricular systolic pressure is 27.6 mmHg. Left Atrium: Left atrial size was mild to moderately dilated. Right Atrium: Right atrial size was mildly dilated. Pericardium: Trivial pericardial effusion is present. Mitral Valve: The mitral valve is normal in structure. There is mild calcification of the mitral valve leaflet(s). Mild mitral annular calcification. No evidence of mitral valve regurgitation. No evidence of mitral valve stenosis. Tricuspid Valve: The tricuspid valve is normal in structure. Tricuspid valve regurgitation is trivial. Aortic Valve: The aortic valve is tricuspid. Aortic valve regurgitation is not visualized. No aortic stenosis is present. Pulmonic Valve: The pulmonic valve was normal in structure. Pulmonic valve regurgitation is not visualized. Aorta: The aortic root is normal in size and structure. Venous: The inferior vena cava is normal in size with greater than 50% respiratory variability, suggesting right atrial pressure of 3 mmHg. IAS/Shunts: No atrial level shunt detected by color flow Doppler.  LEFT VENTRICLE PLAX 2D LVIDd:         3.85 cm  LVIDs:         2.73 cm LV PW:         0.89 cm LV IVS:        1.40 cm LVOT diam:     1.70 cm LV SV:         38 LV SV Index:   21 LVOT Area:     2.27 cm  LV Volumes (MOD) LV vol d, MOD A2C: 52.1 ml LV vol d, MOD A4C: 48.9 ml LV vol s, MOD A2C: 21.7 ml LV vol s, MOD A4C: 17.7 ml LV SV MOD A2C:     30.4 ml LV SV MOD A4C:     48.9 ml LV SV MOD BP:      30.4 ml RIGHT VENTRICLE         IVC TAPSE (M-mode): 1.4 cm  IVC diam: 1.73 cm LEFT ATRIUM             Index       RIGHT ATRIUM           Index LA diam:        3.60 cm 1.96 cm/m  RA Area:     17.90 cm LA Vol (A2C):   75.4 ml 41.14 ml/m RA Volume:   53.70 ml  29.30 ml/m LA Vol (A4C):   53.8 ml 29.36 ml/m LA Biplane Vol: 64.1 ml 34.98 ml/m  AORTIC VALVE LVOT Vmax:   102.00 cm/s LVOT Vmean:  62.200 cm/s LVOT VTI:    0.168 m  AORTA Ao Root diam: 3.10 cm Ao Asc diam:  3.20 cm MITRAL VALVE                TRICUSPID VALVE MV Area (PHT): 4.28 cm     TR Peak grad:   24.6 mmHg MV Decel Time: 177 msec     TR Vmax:        248.00 cm/s MV E velocity: 108.67 cm/s                             SHUNTS                             Systemic VTI:  0.17 m                             Systemic Diam: 1.70 cm Dalton  Mclean MD Electronically signed by Marca Ancona MD Signature Date/Time: 04/22/2020/4:34:41 PM    Final     VAS [place] LOWER EXTREMITY VENOUS (DVT)   Result Date: 04/22/2020  Lower Venous DVTStudy Indications: Stroke.  Comparison Study: no prior Performing Technologist: [name] RVS  Examination Guidelines: A complete evaluation includes B-mode imaging, spectral Doppler, color Doppler, and power Doppler as needed of all accessible portions of each vessel. Bilateral testing is considered an integral part of a complete examination. Limited examinations for reoccurring indications may be performed as noted. The reflux portion of the exam is performed with the patient in reverse Trendelenburg.  +---------+---------------+---------+-----------+----------+--------------+ RIGHT     CompressibilityPhasicitySpontaneityPropertiesThrombus Aging +---------+---------------+---------+-----------+----------+--------------+ CFV      Full           Yes      Yes                                 +---------+---------------+---------+-----------+----------+--------------+ SFJ      Full                                                        +---------+---------------+---------+-----------+----------+--------------+ FV Prox  Full                                                        +---------+---------------+---------+-----------+----------+--------------+ FV Mid   Full                                                        +---------+---------------+---------+-----------+----------+--------------+ FV DistalFull                                                        +---------+---------------+---------+-----------+----------+--------------+ PFV      Full                                                        +---------+---------------+---------+-----------+----------+--------------+ POP      Full           Yes      Yes                                 +---------+---------------+---------+-----------+----------+--------------+ PTV      Full                                                        +---------+---------------+---------+-----------+----------+--------------+ PERO  Not visualized +---------+---------------+---------+-----------+----------+--------------+   +---------+---------------+---------+-----------+----------+--------------+ LEFT     CompressibilityPhasicitySpontaneityPropertiesThrombus Aging +---------+---------------+---------+-----------+----------+--------------+ CFV      Full           Yes      Yes                                 +---------+---------------+---------+-----------+----------+--------------+ SFJ      Full                                                         +---------+---------------+---------+-----------+----------+--------------+ FV Prox  Full                                                        +---------+---------------+---------+-----------+----------+--------------+ FV Mid   Full                                                        +---------+---------------+---------+-----------+----------+--------------+ FV DistalFull                                                        +---------+---------------+---------+-----------+----------+--------------+ PFV      Full                                                        +---------+---------------+---------+-----------+----------+--------------+ POP      Full           Yes      Yes                                 +---------+---------------+---------+-----------+----------+--------------+ PTV      Full                                                        +---------+---------------+---------+-----------+----------+--------------+ PERO                                                  Not visualized +---------+---------------+---------+-----------+----------+--------------+     Summary: BILATERAL: - No evidence of deep vein thrombosis seen in the lower extremities, bilaterally. - No evidence of superficial venous thrombosis in the lower extremities, bilaterally. -No evidence of popliteal cyst, bilaterally.   *See table(s) above for measurements and observations.    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  1. Does the need for close, 24 hr/day medical supervision in concert with the patient's rehab needs make it unreasonable for this patient to be served in a less intensive setting? Yes 2. Co-Morbidities requiring supervision/potential complications: alcohol abuse, HTN, R hemiplegia, A fib with RVR,  dysphagia/aphasia, neglect, is pusher 3. Due to bladder management, bowel management, safety, skin/wound care, disease management, medication administration and patient education, does the patient require 24 hr/day rehab nursing? Yes 4. Does the patient require coordinated care of a physician, rehab nurse, therapy disciplines of PT, OT and SLP to address physical and functional deficits in the context of the above medical diagnosis(es)? Yes Addressing deficits in the following areas: balance, endurance, locomotion, strength, transferring, bowel/bladder control, bathing, dressing, feeding, grooming, toileting, cognition, speech, language and swallowing 5. Can the patient actively participate in an intensive therapy program of at least 3 hrs of therapy per day at least 5 days per week? Yes 6. The potential for patient to make measurable gains while on inpatient rehab is fair and poor 7. Anticipated functional outcomes upon discharge from inpatient rehab are min assist and mod assist  with PT, min assist and mod assist with OT, min assist with SLP. 8. Estimated rehab length of stay to reach the above functional goals is: 25-30 days possibly 9. Anticipated discharge destination: Home 10. Overall Rehab/Functional Prognosis: fair and poor   RECOMMENDATIONS: This patient's condition is appropriate for continued rehabilitative care in the following setting: CIR Patient has agreed to participate in recommended program. Potentially Note that insurance prior authorization may be required for reimbursement for recommended care.   Comment:  1. Pt unable to consent at this time- daughter and grandson in room- grandson lives in [place]- not clear on daughter and if they can provide 24 hours assistance.  2. If family can provide 24 hr assistance, pt is then appropriate for inpt rehab/CIR- however it's noted she has no insurance this would not stop CIR admission, but make SNF placement very difficult if family cannot  care for her.  3. Pt has severe dysphagia, dysarthria, early spasticity, R hemiplegia, R neglect/inattention, L gaze preference, and was pushing during therapy- these all signal a less positive outcome - possibly- spent 20 minutes discussing with grandson, bluntly, that pt will need 24 hour assistance for the foreseeable future- if she's unable to come to CIR for any reason, will need to see PM&R in future for function and spasticity management- please give contact info if needed- [phone].  4. Suggest Amantadine after 1 week for speech recovery- and initiation. 100 mg daily x 1 week then 200 mg daily.  5. Will submit/have admission coordinators assess pt for possible CIR admission- 6. Needs to have heart rate addressed more aggressively before coming to CIR, please! Going up to 120s-130s with minimal activity 7. Thank you for this consult    Jacquelynn Cree, PA-C 04/22/2020    I have personally performed a face to face diagnostic evaluation of this patient and formulated the key components of the plan.  Additionally, I have personally reviewed laboratory data, imaging studies, as well as relevant notes and concur with the physician assistant's documentation above.   I spent a total of 80 minutes on consult- 20 minutes reviewing chart, 25 minutes in room with pt/interview/exam; 15 minutes speaking with grandson; and 20 minutes doing/typing consult.  Revision History                     Routing History           Note Details  Waymon Amato, MD File Time 04/23/2020  3:20 PM  Author Type Physician Status Signed  Last Editor Genice Rouge, MD Service Physical Medicine and Rehabilitation

## 2020-05-01 ENCOUNTER — Inpatient Hospital Stay (HOSPITAL_COMMUNITY): Payer: Self-pay

## 2020-05-01 ENCOUNTER — Inpatient Hospital Stay (HOSPITAL_COMMUNITY): Payer: Self-pay | Admitting: Speech Pathology

## 2020-05-01 ENCOUNTER — Encounter (HOSPITAL_COMMUNITY): Payer: Self-pay | Admitting: Physical Medicine & Rehabilitation

## 2020-05-01 ENCOUNTER — Inpatient Hospital Stay (HOSPITAL_COMMUNITY): Payer: Self-pay | Admitting: Physical Therapy

## 2020-05-01 DIAGNOSIS — G8191 Hemiplegia, unspecified affecting right dominant side: Secondary | ICD-10-CM

## 2020-05-01 LAB — CBC WITH DIFFERENTIAL/PLATELET
Abs Immature Granulocytes: 0.01 10*3/uL (ref 0.00–0.07)
Basophils Absolute: 0 10*3/uL (ref 0.0–0.1)
Basophils Relative: 1 %
Eosinophils Absolute: 0.1 10*3/uL (ref 0.0–0.5)
Eosinophils Relative: 2 %
HCT: 36.7 % (ref 36.0–46.0)
Hemoglobin: 12.4 g/dL (ref 12.0–15.0)
Immature Granulocytes: 0 %
Lymphocytes Relative: 26 %
Lymphs Abs: 1.1 10*3/uL (ref 0.7–4.0)
MCH: 30 pg (ref 26.0–34.0)
MCHC: 33.8 g/dL (ref 30.0–36.0)
MCV: 88.6 fL (ref 80.0–100.0)
Monocytes Absolute: 1 10*3/uL (ref 0.1–1.0)
Monocytes Relative: 23 %
Neutro Abs: 2.1 10*3/uL (ref 1.7–7.7)
Neutrophils Relative %: 48 %
Platelets: 339 10*3/uL (ref 150–400)
RBC: 4.14 MIL/uL (ref 3.87–5.11)
RDW: 14 % (ref 11.5–15.5)
WBC: 4.4 10*3/uL (ref 4.0–10.5)
nRBC: 0 % (ref 0.0–0.2)

## 2020-05-01 LAB — GLUCOSE, CAPILLARY
Glucose-Capillary: 111 mg/dL — ABNORMAL HIGH (ref 70–99)
Glucose-Capillary: 103 mg/dL — ABNORMAL HIGH (ref 70–99)
Glucose-Capillary: 103 mg/dL — ABNORMAL HIGH (ref 70–99)
Glucose-Capillary: 138 mg/dL — ABNORMAL HIGH (ref 70–99)

## 2020-05-01 LAB — COMPREHENSIVE METABOLIC PANEL
ALT: 106 U/L — ABNORMAL HIGH (ref 0–44)
AST: 102 U/L — ABNORMAL HIGH (ref 15–41)
Albumin: 2.9 g/dL — ABNORMAL LOW (ref 3.5–5.0)
Alkaline Phosphatase: 64 U/L (ref 38–126)
Anion gap: 11 (ref 5–15)
BUN: 16 mg/dL (ref 8–23)
CO2: 22 mmol/L (ref 22–32)
Calcium: 9.2 mg/dL (ref 8.9–10.3)
Chloride: 99 mmol/L (ref 98–111)
Creatinine, Ser: 0.56 mg/dL (ref 0.44–1.00)
GFR calc Af Amer: >60 mL/min (ref >60)
GFR calc non Af Amer: >60 mL/min (ref >60)
Glucose, Bld: 119 mg/dL — ABNORMAL HIGH (ref 70–99)
Potassium: 4.2 mmol/L (ref 3.5–5.1)
Sodium: 132 mmol/L — ABNORMAL LOW (ref 135–145)
Total Bilirubin: 0.7 mg/dL (ref 0.3–1.2)
Total Protein: 7.7 g/dL (ref 6.5–8.1)

## 2020-05-01 NOTE — Progress Notes (Signed)
Patient information reviewed and entered into [facility] by Becky Virginie Josten, PPS coordinator. Information including medical coding, function ability, and quality indicators will be reviewed and updated through discharge.   

## 2020-05-01 NOTE — Evaluation (Signed)
Physical Therapy Assessment and Plan  Patient Details  Name: Lindsey [name] MRN: [id number] Date of Birth: [birth year]  PT Diagnosis: Abnormality of gait, Cognitive deficits, Coordination disorder, Difficulty walking, Hemiparesis dominant, Impaired cognition, Impaired sensation and Muscle weakness Rehab Potential: Fair ELOS: 3.5-4 weeks   Today's Date: 05/01/2020 PT Individual Time: 1249-1400 PT Individual Time Calculation (min): 71 min    Hospital Problem: Principal Problem:   Right hemiplegia ([place]) Active Problems:   Acute ischemic left MCA stroke [facility])   Past Medical History:  Past Medical History:  Diagnosis Date  . Diverticulosis   . Hypertension   . Uterine prolapse    Past Surgical History: History reviewed. No pertinent surgical history.  Assessment & Plan Clinical Impression: Patient is a 64 y.o. year old female with recent admission to the hospital on  admitted on 04/21/20 with right-sided weakness and slurred speech.  She was last seen normal the evening before and family noted slurred speech next a.m. but contributed to intoxicated state.  She was admitted later that evening for work-up.  She was found to have dysarthria, aphasia, left gaze preference and right hemiparesis.  She was also noted to be in A. fib with RVR and UDS positive for THC.  CT head showed large-MCA infarct and CTA brain revealed a left M2 occlusion but patient out of window for thrombolysis.  Follow-up MRI brain later that day showed acute left-MCA infarct with mild hemorrhage in caudate,  putamen and left frontal lobe.   2D echo showed EF of 60 to 65% with mild LVH, mild RA dilatation and no wall abnormality.  BLE Dopplers were negative for DVT.  Follow-up CT head showed evolving infarct with increase in cytotoxic edema and partial effacement of left lateral ventricle with stable areas of hemorrhage.  Dr. Leonie Man  felt that stroke was embolic in setting of A. fib and ASA was added on 07/21--to repeat CT  head in 7-10 days to decide on initiating AC.  Cardiology was consulted to help manage A. Fib/flutter and IV metoprolol added for rate control and transitioned to po yesterday.  Patient limited by aphasia expressive>receptive and dense right hemiplegia. Therapy ongoing and working on balance at EOB.  CIR recommended due to functional decline.   Patient transferred to CIR on 04/30/2020 .   Patient currently requires mod with mobility secondary to muscle weakness, decreased cardiorespiratoy endurance, decreased coordination, decreased visual perceptual skills, decreased attention to left, decreased attention to right and right side neglect, decreased initiation, decreased attention, decreased awareness, decreased problem solving, decreased safety awareness, decreased memory and delayed processing, and decreased sitting balance, decreased standing balance, decreased postural control, decreased balance strategies and R hemiparesis.  Prior to hospitalization, patient was independent  with mobility and lived with Daughter in a House home.  Home access is  Level entry.  Patient will benefit from skilled PT intervention to maximize safe functional mobility, minimize fall risk and decrease caregiver burden for planned discharge home with 24 hour assist.  Anticipate patient will benefit from follow up [facility] at discharge.  PT - End of Session Activity Tolerance: Tolerates 30+ min activity with multiple rests Endurance Deficit: Yes Endurance Deficit Description: generalized deconditioning, quick to fatigue PT Assessment Rehab Potential (ACUTE/IP ONLY): Fair PT Patient demonstrates impairments in the following area(s): Perception;Balance;Behavior;Safety;Edema;Sensory;Endurance;Motor;Pain PT Transfers Functional Problem(s): Bed Mobility;Bed to Chair;Car;Furniture PT Locomotion Functional Problem(s): Stairs;Wheelchair Mobility;Ambulation PT Plan PT Intensity: Minimum of 1-2 x/day ,45 to 90 minutes PT Frequency: 5  out of 7 days PT Duration  Estimated Length of Stay: 3.5-4 weeks PT Treatment/Interventions: Ambulation/gait training;Community reintegration;DME/adaptive equipment instruction;Neuromuscular re-education;Psychosocial support;Stair training;UE/LE Strength taining/ROM;Wheelchair propulsion/positioning;UE/LE Coordination activities;Therapeutic Activities;Skin care/wound management;Pain management;Functional electrical stimulation;Discharge planning;Balance/vestibular training;Cognitive remediation/compensation;Disease management/prevention;Functional mobility training;Patient/family education;Splinting/orthotics;Therapeutic Exercise;Visual/perceptual remediation/compensation PT Transfers Anticipated Outcome(s): supervision<>min assist PT Locomotion Anticipated Outcome(s): supervision w/c, mod assist short distance gait PT Recommendation Recommendations for Other Services: Speech consult Follow Up Recommendations: Home health PT;24 hour supervision/assistance Patient destination: Home Equipment Recommended: To be determined   PT Evaluation Precautions/Restrictions Precautions Precautions: Fall Precaution Comments: R neglect, R hemi Restrictions Weight Bearing Restrictions: No  General Chart Reviewed: Yes Additional Pertinent History: HTN, alcohol abuse Response to Previous Treatment: Patient reporting fatigue but able to participate. Family/Caregiver Present: Yes (daughter Madelaine Bhat)  Pain No behaviors demonstrating or c/o pain  Home Living/Prior Functioning Home Living Available Help at Discharge: Family;Available 24 hours/day Type of Home: House Home Access: Level entry Home Layout: Multi-level;Able to live on main level with bedroom/bathroom;Other (Comment);1/2 bath on main level Alternate Level Stairs-Number of Steps: flight  Lives With: Daughter Prior Function Level of Independence: Independent with basic ADLs;Independent with transfers;Independent with gait  Able to Take Stairs?:  Yes Driving: Yes Vocation: Part time employment Vocation Requirements: works at [employer] part time Leisure: Hobbies-yes (Comment) Comments: watching grandkids when not working  Vision/Perception  Pt wears glasses at all times at baseline. Pt with c/o blurry vision & unable to determine if pt reports diplopia or not 2/2 communication deficits. Perception Perception: Impaired Inattention/Neglect: Does not attend to right visual field;Does not attend to right side of body Praxis Praxis: Impaired Praxis Impairment Details: Motor planning;Initiation   Cognition Overall Cognitive Status: Impaired/Different from baseline Arousal/Alertness: Awake/alert Orientation Level: Oriented to person Sustained Attention: Impaired Sustained Attention Impairment: Functional basic Memory: Impaired Awareness: Impaired Awareness Impairment: Emergent impairment;Anticipatory impairment;Intellectual impairment Problem Solving: Impaired Problem Solving Impairment: Functional basic Safety/Judgment: Impaired  Sensation Sensation Light Touch: Not tested (per daughter, pt felt it when she touched her RUE) Proprioception: Impaired by gross assessment Coordination Gross Motor Movements are Fluid and Coordinated: No Fine Motor Movements are Fluid and Coordinated: No Coordination and Movement Description: dense R hemi  Motor  Motor Motor: Abnormal postural alignment and control Motor - Skilled Clinical Observations: dense R hemiparesis, generalized deconditioning & quick to fatigue   Trunk/Postural Assessment  Thoracic Assessment Thoracic Assessment:  (rounded shoulders) Lumbar Assessment Lumbar Assessment:  (posterior pelvic tilt) Postural Control Postural Control: Deficits on evaluation Righting Reactions: impaired Protective Responses: impaired   Balance Balance Balance Assessed: Yes Static Sitting Balance Static Sitting - Balance Support: Bilateral upper extremity supported;Feet  supported Static Sitting - Level of Assistance: 4: Min assist Static Standing Balance Static Standing - Balance Support: Left upper extremity supported Static Standing - Level of Assistance: 3: Mod assist  Extremity Assessment  RUE Assessment RUE Assessment: Exceptions to [facility] RUE Body System: Neuro Brunstrum levels for arm and hand: Arm;Hand Brunstrum level for arm: Stage I Presynergy Brunstrum level for hand: Stage I Flaccidity LUE Assessment LUE Assessment: Within Functional Limits RLE Assessment RLE Assessment: Exceptions to [facility] General Strength Comments: no active movement noted LLE Assessment LLE Assessment: Within Functional Limits  Care Tool Care Tool Bed Mobility Roll left and right activity Roll left and right activity did not occur: Safety/medical concerns      Sit to lying activity Sit to lying activity did not occur: Safety/medical concerns      Lying to sitting edge of bed activity Lying to sitting edge of bed activity did not occur: Safety/medical concerns  Care Tool Transfers Sit to stand transfer Sit to stand activity did not occur: Safety/medical concerns Sit to stand assist level: 2 Helpers    Chair/bed transfer Chair/bed transfer activity did not occur: Safety/medical concerns       Toilet transfer Toilet transfer activity did not occur: Safety/medical concerns Assist Level: 2 Production assistant, radio transfer activity did not occur: Safety/medical concerns        Care Tool Locomotion Ambulation Ambulation activity did not occur: Safety/medical concerns        Walk 10 feet activity Walk 10 feet activity did not occur: Safety/medical concerns       Walk 50 feet with 2 turns activity Walk 50 feet with 2 turns activity did not occur: Safety/medical concerns      Walk 150 feet activity Walk 150 feet activity did not occur: Safety/medical concerns      Walk 10 feet on uneven surfaces activity Walk 10 feet on uneven surfaces activity  did not occur: Safety/medical concerns      Stairs Stair activity did not occur: Safety/medical concerns        Walk up/down 1 step activity Walk up/down 1 step or curb (drop down) activity did not occur: Safety/medical concerns     Walk up/down 4 steps activity did not occuR: Safety/medical concerns  Walk up/down 4 steps activity      Walk up/down 12 steps activity Walk up/down 12 steps activity did not occur: Safety/medical concerns      Pick up small objects from floor Pick up small object from the floor (from standing position) activity did not occur: Safety/medical concerns      Wheelchair Will patient use wheelchair at discharge?: Yes Type of Wheelchair: Manual   Wheelchair assist level: Moderate Assistance - Patient 50 - 74% Max wheelchair distance: 10 ft  Wheel 50 feet with 2 turns activity   Assist Level: Maximal Assistance - Patient 25 - 49%  Wheel 150 feet activity   Assist Level: Total Assistance - Patient < 25%    Refer to Care Plan for Long Term Goals  SHORT TERM GOAL WEEK 1 PT Short Term Goal 1 (Week 1): Pt will complete bed<>w/c transfers with min assist. PT Short Term Goal 2 (Week 1): Pt will ambulate 30 ft with LRAD & max assist. PT Short Term Goal 3 (Week 1): Pt will propel w/c 75 ft with min assist. PT Short Term Goal 4 (Week 1): Pt will complete bed mobility with min assist.  Recommendations for other services: None   Skilled Therapeutic Intervention Pt received in bed with daughter present in room with mask below chin - educated her on mask policy & she adjusted it for proper wear. Educated pt & daughter Madelaine Bhat) on ELOS as reported by OT, weekly team meetings, daily therapy schedule, and other CIR information. Kida provides PLOF & home set up info. Pt transfers supine>sit with mod assist with max cuing to utilize bed rail but pt ultimately using PT's hand and requiring mod assist to upright trunk to come to sitting EOB. Pt completes squat pivot to L with  mod assist with multiple pivots & cuing to sit square in w/c seat. Pt propels w/c 10 ft then 50 ft with L hemi technique & mod assist for steering with pt demonstrating R inattention & requiring instructional cuing for task. Sit<>stand at rail in hallway with mirror for visual feedback with PT blocking R Knee to prevent buckling. Pt encouraged to take step with LLE  but each time (attempted 3 times) pt with difficulty initiating step & would return to sitting in w/c. Pt requires min/mod assist for standing balance with rail. Pt utilized kinetron from w/c level with task focusing on BLE strengthening & RLE NMR but no active movement noted. At end of session pt left in w/c with chair alarm donned & call bell in reach, daughter present in room.  Provided pt with 18x18 w/c and R half lap tray to promote OOB tolerance. Encouraged pt to sit up as much as possible.   Mobility Bed Mobility Bed Mobility: Supine to Sit Supine to Sit: Moderate Assistance - Patient 50-74% (bed rails, HOB elevated) Transfers Transfers: Pharmacist, hospital Pivot Transfers: Moderate Assistance - Patient 50-74% Locomotion  Gait Ambulation: No Gait Gait: No Stairs / Additional Locomotion Stairs: No Wheelchair Mobility Wheelchair Mobility: Yes Wheelchair Assistance: Moderate Assistance - Patient 50 - 74% Wheelchair Propulsion: Left lower extremity;Left upper extremity Wheelchair Parts Management: Needs assistance Distance: 50 ft max   Discharge Criteria: Patient will be discharged from PT if patient refuses treatment 3 consecutive times without medical reason, if treatment goals not met, if there is a change in medical status, if patient makes no progress towards goals or if patient is discharged from hospital.  The above assessment, treatment plan, treatment alternatives and goals were discussed and mutually agreed upon: by patient and by family  [place] 05/01/2020, 4:09 PM

## 2020-05-01 NOTE — Progress Notes (Signed)
[place] PHYSICAL MEDICINE & REHABILITATION PROGRESS NOTE   Subjective/Complaints: No complaints this morning.  Labs stable this morning, Na slightly low.  Denies pain, constipation, insomnia. Daughter is at bedside.  ROS: Limited by aphasia   Objective:   No results found. Recent Labs    04/29/20 0125 05/01/20 0708  WBC 4.5 4.4  HGB 11.7* 12.4  HCT 35.5* 36.7  PLT 233 339   Recent Labs    04/29/20 0125 05/01/20 0708  NA 133* 132*  K 4.5 4.2  CL 98 99  CO2 23 22  GLUCOSE 106* 119*  BUN 17 16  CREATININE 0.54 0.56  CALCIUM 9.1 9.2   No intake or output data in the 24 hours ending 05/01/20 1510   Physical Exam: Vital Signs Blood pressure 117/77, pulse 79, temperature 98.5 F (36.9 C), temperature source Oral, resp. rate 16, height 5\' 5"  (1.651 m), weight 76.6 kg, SpO2 99 %.  General: Alert and oriented x 3, No apparent distress HEENT: Head is normocephalic, atraumatic, L gaze preference, tongue with white coating, severe R facial droop- tongue midline- slightly coated Neck: Supple without JVD or lymphadenopathy Heart: Reg rate and rhythm. No murmurs rubs or gallops Chest: CTA bilaterally without wheezes, rales, or rhonchi; no distress Abdomen: Soft, non-tender, non-distended, bowel sounds positive. Extremities: No clubbing, cyanosis, or edema. Pulses are 2+ Skin: IV in place; C/D/I Neuro: Expressive>receptive aphasia. Right sided hemiplegia. Left side 5/5 Psych: Pt's affect is flat, frustrated at times  Assessment/Plan: 1. 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  Physiatrist is providing close team supervision and 24 hour management of active medical problems listed below.  Physiatrist and rehab team continue to assess barriers to discharge/monitor patient progress toward functional and medical goals  Care Tool:  Bathing     Body parts bathed by patient: Chest, Abdomen, Front perineal area, Left upper leg, Face   Body parts bathed by helper: Right arm, Left arm, Buttocks, Right upper leg, Right lower leg, Left lower leg     Bathing assist Assist Level: Maximal Assistance - Patient 24 - 49%     Upper Body Dressing/Undressing Upper body dressing   What is the patient wearing?: Pull over shirt    Upper body assist Assist Level: Maximal Assistance - Patient 25 - 49%    Lower Body Dressing/Undressing Lower body dressing      What is the patient wearing?: Incontinence brief, Pants     Lower body assist Assist for lower body dressing: Dependent - Patient 0% (stedy)     Toileting Toileting    Toileting assist Assist for toileting: 2 Helpers     Transfers Chair/bed transfer  Transfers assist     Chair/bed transfer assist level: Dependent - mechanical lift     Locomotion Ambulation   Ambulation assist              Walk 10 feet activity   Assist           Walk 50 feet activity   Assist           Walk 150 feet activity   Assist           Walk 10 feet on uneven surface  activity   Assist           Wheelchair     Assist  Wheelchair 50 feet with 2 turns activity    Assist            Wheelchair 150 feet activity     Assist          Blood pressure 117/77, pulse 79, temperature 98.5 F (36.9 C), temperature source Oral, resp. rate 16, height 5\' 5"  (1.651 m), weight 76.6 kg, SpO2 99 %.    Medical Problem List and Plan: 1.  R hemiplegia and impaired function/expressive aphasia and R inattention secondary to L MCA stroke with hemorrhagic extension- with dysphagia- on D2 with thins             -patient may  shower             -ELOS/Goals: 2.5 to 3 weeks- goals Mod I to min A 2.  Antithrombotics: -DVT/anticoagulation:  Pharmaceutical: Lovenox             -antiplatelet therapy:  ASA due to bleed. Discussed with  stroke NP--will order CT head on Monday am and contact neurology for input on [facility].  3. Pain Management: Tylenol prn. Well controlled 4. Mood: LCSW to follow for evaluation and support.              -antipsychotic agents: N/a 5. Neuropsych: This patient is not fully capable of making decisions on her  own behalf. 6. Skin/Wound Care: Routine pressure relief measures. IVs may be removed. 7. Fluids/Electrolytes/Nutrition: Monitor I/O. Na is 132. Repeat on Monday 8. A fib with RVR: Monitor HR bid--continue metoprolol 75 mg bid. HR remains 90-120 range in past 24 hours. NO AC at this time due to bleed.  9. HTN: Monitor BP tid.  On Lopressor 75 mg bid.  10 Dysphagia: On Dysphagia 2, thin liquids. Encourage intake. Will have SLP and reassess swallow per SLP. 11. ETOH abuse: Has not had issues with withdrawal.  12. Prediabetes: Hgb A1c-6.1. Monitor CBGs ac/hs. Add CM restrictions. RD to educate patient/family on CM diet.  13. ABLA: Recheck CBC stable 7/30   LOS: 1 days A FACE TO FACE EVALUATION WAS PERFORMED  Levie Wages P Chaney [place] 05/01/2020, 3:10 PM

## 2020-05-01 NOTE — Evaluation (Signed)
Speech Language Pathology Assessment and Plan  Patient Details  Name: Lindsey [name] MRN: [id number] Date of Birth: 1955-12-08  SLP Diagnosis: Aphasia;Dysphagia  Rehab Potential: Good ELOS: 2.5 to 3 weeks.    Today's Date: 05/01/2020 SLP Individual Time:  - 0802-0900     Hospital Problem: Principal Problem:   Right hemiplegia [facility]) Active Problems:   Acute ischemic left MCA stroke [facility])  Past Medical History:  Past Medical History:  Diagnosis Date  . Diverticulosis   . Hypertension   . Uterine prolapse    Past Surgical History: History reviewed. No pertinent surgical history.  Assessment / Plan / Recommendation Clinical Impression   Patient is a 64 y.o. year old female with recent admission to the hospital on admitted on 04/21/20 with right-sided weakness and slurred speech. She was last seen normal the evening before and family noted slurred speech next a.m. but contributed to intoxicated state. She was admitted later that evening for work-up. She was found to have dysarthria, aphasia, left gaze preference and right hemiparesis. She was also noted to be in A. fib with RVR and UDS positive for THC. CT head showed large-MCA infarct and CTA brain revealed a left M2 occlusion but patient out of window for thrombolysis. Follow-up MRI brain later that day showed acute left-MCA infarct with mild hemorrhage in caudate, putamen and left frontal lobe. 2D echo showed EF of 60 to 65% with mild LVH, mild RA dilatation and no wall abnormality. BLE Dopplers were negative for DVT.  Follow-up CT head showed evolving infarct with increase in cytotoxic edema and partial effacement of left lateral ventricle with stable areas of hemorrhage. Dr. Leonie Man felt that stroke was embolic in setting of A. fib and ASA was added on 07/21--to repeat CT head in 7-10 days to decide on initiating AC. Cardiology was consulted to help manage A. Fib/flutter and IV metoprolol added for rate control and  transitioned to po yesterday. Patient limited by aphasia expressive>receptive and dense right hemiplegia. Therapy ongoing and working on balance at EOB. CIR recommended due to functional decline. Patient transferred to CIR on 04/30/2020 .   Pt presents with severe expressive-receptive aphasia characterized by limited spontaneous responses, decreased comprehension of yes/ no questions and ability to follow commands. Pt produced few stereotypical utterances such as "ok." Mild dysarthria noted with single words produced during session.  With automatic speech, pt demonstrated ability to count 1-10 in unison then fading to mod I. Pt perseverated on counting 1-10 when attempting to sing "happy birthday". She was able to repeat 2/3 single words with visual cue. Pt demonstrated moderate awareness of errors with no attempt to correct. Pt was able to identify objects in a field of 2 on 0/5 trials and favored her left side in selecting objects, noting right inattention during task. Pt could identify city she lives in and her name given 2 written choices. Pt was able to write first and last name after first two letters of first name were written. Moderate impairment demonstrated with following one step commands. Lindsey [name] cognitive assessment was not given due to severity of language deficits, however noted right inattention, decreased attention, decreased problem solving and awareness of deficits.   Pt presents with moderate oropharyngeal dysphagia characterized by decreased oral clearance with all consistencies, coughing immediately after swallow with mixed consistencies, decreased mastication strength and decreased bolus cohesion. Pt completed trials with thin liquid by cup sip, ground and pureed consistency. Moderate residue after swallow with ground sausage but was able to reduce to  mild with cues for lingual sweeps. Coughing noted x1 after swallow with attempted liquid wash to clear residue. Pt followed commands to use  tongue to clear right buccal cavity with min improvement. Pt eventually required spoon sweep to clear remaining residue in right buccal cavity. SLP recommends diet of dys 2 and thin liquids.  Full supervision needed due to right inattention and current swallowing deficits.  SLP will focus on language skills and dysphagia during CIR admission. Pt is a good candidate for skilled slp tx. Pt would benefit from skilled ST services in order to maximize functional independence and reduce burden of care, requiring 24 hour supervision and continued ST services.    Skilled Therapeutic Interventions          Bedside swallow evaluation and speech/language evaluation completed. SLP educated pt on deficits and plan for treatment. Pt was left with call bell within reach and bed alarm on. Pt will continue with ST services.   SLP Assessment  Patient will need skilled Speech Lanaguage Pathology Services during CIR admission    Recommendations  Supervision: Staff to assist with self feeding;Full supervision/cueing for compensatory strategies Compensations: Slow rate;Small sips/bites;Lingual sweep for clearance of pocketing Postural Changes and/or Swallow Maneuvers: Seated upright 90 degrees    SLP Frequency 3 to 5 out of 7 days   SLP Duration  SLP Intensity  SLP Treatment/Interventions 2.5 to 3 weeks.  Minumum of 1-2 x/day, 30 to 90 minutes  Cognitive remediation/compensation;Cueing hierarchy;Functional tasks;Patient/family education;Speech/Language facilitation;Dysphagia/aspiration precaution training    Pain Pain Assessment Pain Scale: 0-10 Faces Pain Scale: No hurt  Prior Functioning Cognitive/Linguistic Baseline: Information not available Type of Home: House  Lives With: Daughter Available Help at Discharge: Family Vocation: Part time employment  SLP Evaluation Cognition Overall Cognitive Status: Impaired/Different from baseline (cognitive skills were not formally tested due to language  deficits.) Arousal/Alertness: Awake/alert Orientation Level: Oriented to person;Oriented to place Focused Attention: Appears intact Sustained Attention: Impaired Sustained Attention Impairment: Functional basic Memory: Impaired Awareness: Impaired Awareness Impairment: Emergent impairment;Intellectual impairment Problem Solving: Impaired Problem Solving Impairment: Verbal basic;Functional basic Safety/Judgment: Impaired  Comprehension Auditory Comprehension Overall Auditory Comprehension: Impaired Yes/No Questions: Impaired Basic Biographical Questions: 0-25% accurate Basic Immediate Environment Questions: 0-24% accurate Complex Questions: 0-24% accurate Commands: Impaired One Step Basic Commands: 25-49% accurate Two Step Basic Commands: 0-24% accurate Multistep Basic Commands: 0-24% accurate Complex Commands: 0-24% accurate Visual Recognition/Discrimination Discrimination: Exceptions to [facility] Common Objects: Unable to indentify Reading Comprehension Reading Status: Impaired Word level: Impaired Sentence Level: Not tested Expression Expression Primary Mode of Expression: Nonverbal - gestures Verbal Expression Overall Verbal Expression: Impaired Initiation: No impairment Automatic Speech: Counting;Singing Level of Generative/Spontaneous Verbalization: Word Repetition: Impaired Level of Impairment: Phrase level Naming: Impairment Responsive: 0-25% accurate Confrontation: Impaired Common Objects: Unable to indentify Oral Motor Oral Motor/Sensory Function Overall Oral Motor/Sensory Function: Moderate impairment Facial ROM: Reduced right Facial Symmetry: Abnormal symmetry right Facial Strength: Reduced right Facial Sensation: Reduced right Lingual ROM: Reduced right Lingual Symmetry: Abnormal symmetry right Lingual Strength: Reduced Mandible: Impaired Motor Speech Overall Motor Speech: Impaired  Care Tool Care Tool Cognition Expression of Ideas and Wants  Expression of Ideas and Wants: Rarely/Never expressess or very difficult - rarely/never expresses self or speech is very difficult to understand   Understanding Verbal and Non-Verbal Content Understanding Verbal and Non-Verbal Content: Sometimes understands - understands only basic conversations or simple, direct phrases. Frequently requires cues to understand   Memory/Recall Ability *first 3 days only      Bedside Swallowing Assessment  General Diet Prior to this Study: Dysphagia 2 (chopped);Thin liquids Behavior/Cognition: Alert;Cooperative;Pleasant mood Oral Cavity - Dentition: Missing dentition Self-Feeding Abilities: Able to feed self;Needs assist;Needs set up Vision: Impaired for self-feeding Patient Positioning: Upright in bed  Oral Care Assessment Patient is AT RISK: Order set for Adult Oral Care Protocol initiated -  "At Risk Patients" option selected (see row information) Ice Chips Ice chips: Not tested Thin Liquid Thin Liquid: Within functional limits (coughing with mixed consistencies noted 1 time.) Nectar Thick Nectar Thick Liquid: Not tested Honey Thick Honey Thick Liquid: Not tested Puree Puree: Impaired Oral Phase Functional Implications: Right lateral sulci pocketing Solid Solid: Impaired Oral Phase Functional Implications: Right lateral sulci pocketing BSE Assessment Risk for Aspiration Impact on safety and function: Mild aspiration risk  Short Term Goals: Week 1: SLP Short Term Goal 1 (Week 1): Pt will increase oral clearance with dysphagia 2 diet with use of compensatory strategies x85% min A. SLP Short Term Goal 1 - Progress (Week 1): Progressing toward goal SLP Short Term Goal 2 (Week 1): Pt will increase ability to follow simple one step commands related to adls K91% min A. SLP Short Term Goal 2 - Progress (Week 1): Progressing toward goal SLP Short Term Goal 3 (Week 1): Pt will name functional objects on 5/10 trials with max multimodal cues. SLP Short  Term Goal 3 - Progress (Week 1): Progressing toward goal SLP Short Term Goal 4 (Week 1): Pt will express wants/needs at phrase level with max verbal cues. SLP Short Term Goal 4 - Progress (Week 1): Progressing toward goal SLP Short Term Goal 5 (Week 1): Pt will answer (gestures or word level) simple yes/no questions with 70% accuracy.  Refer to Care Plan for Long Term Goals  Recommendations for other services: None   Discharge Criteria: Patient will be discharged from SLP if patient refuses treatment 3 consecutive times without medical reason, if treatment goals not met, if there is a change in medical status, if patient makes no progress towards goals or if patient is discharged from hospital.  The above assessment, treatment plan, treatment alternatives and goals were discussed and mutually agreed upon: by patient  [place] 05/01/2020, 5:05 PM

## 2020-05-01 NOTE — Progress Notes (Signed)
Patient ID: Lindsey [name], female   DOB: 05-15-1956, 64 y.o.   MRN: [id number]   SW made efforts to meet with pt and pt dtr [name] to complete assessment. Pt dtr was getting ready to leave for work. SW informed will follow-up by phone call on Monday.  *SW added pt sister Gavin Pound to contact list as she has stated it is okay to give her information when she calls.   Cecile Sheerer, MSW, LCSWA Office: [phone] Cell: [phone] Fax: [phone]

## 2020-05-01 NOTE — Evaluation (Signed)
Occupational Therapy Assessment and Plan  Patient Details  Name: Lindsey [name] MRN: [id number] Date of Birth: 1955-12-26  OT Diagnosis: abnormal posture, apraxia, ataxia, cognitive deficits, disturbance of vision, flaccid hemiplegia and hemiparesis, hemiplegia affecting dominant side and muscle weakness (generalized) Rehab Potential:   ELOS: 3-4 weeks   Today's Date: 05/01/2020 OT Individual Time: 0076-2263 OT Individual Time Calculation (min): 60 min     Hospital Problem: Principal Problem:   Right hemiplegia ([place]) Active Problems:   Acute ischemic left MCA stroke [facility])   Past Medical History:  Past Medical History:  Diagnosis Date  . Diverticulosis   . Hypertension   . Uterine prolapse    Past Surgical History: History reviewed. No pertinent surgical history.  Assessment & Plan Clinical Impression: Pt is a 64 y.o. female admitted 04/21/20 with R weakness, facial droop, and severe dysarthria. CT and MRI brain reveal large L MCA, initially ischemic but with some hemorrhagic conversion. CT scan 7/23 shows evolving large L MCA infarct with stable hemorrhagic transformation but increasing edema and 3 mm left-to-right shift. PMH includes HTN, afib with RVR.  Patient currently requires max with basic self-care skills secondary to muscle weakness, decreased cardiorespiratoy endurance, impaired timing and sequencing, abnormal tone, unbalanced muscle activation, decreased coordination and decreased motor planning, decreased visual perceptual skills and decreased visual motor skills, decreased midline orientation and right side neglect, decreased initiation, decreased attention, decreased awareness, decreased problem solving, decreased safety awareness, decreased memory and delayed processing and decreased sitting balance, decreased standing balance, decreased postural control, hemiplegia and decreased balance strategies.  Prior to hospitalization, patient could complete BADL/IADL with  independent .  Patient will benefit from skilled intervention to decrease level of assist with basic self-care skills and increase independence with basic self-care skills prior to discharge home with care partner.  Anticipate patient will require 24 hour supervision and minimal physical assistance and follow up home health.  OT - End of Session Endurance Deficit: Yes Endurance Deficit Description: generalized deconditioning, quick to fatigue OT Assessment Rehab Potential (ACUTE ONLY): Good OT Barriers to Discharge: Weight;Inaccessible home environment;Home environment access/layout OT Patient demonstrates impairments in the following area(s): Balance;Cognition;Motor;Endurance;Perception;Safety;Sensory;Vision OT Basic ADL's Functional Problem(s): Eating;Grooming;Bathing;Dressing OT Transfers Functional Problem(s): Toilet (shower on second floor per chart review; Add goal as approprate) OT Additional Impairment(s): Fuctional Use of Upper Extremity OT Plan OT Intensity: Minimum of 1-2 x/day, 45 to 90 minutes OT Frequency: 5 out of 7 days OT Duration/Estimated Length of Stay: 3-4 weeks OT Treatment/Interventions: Balance/vestibular training;Discharge planning;Functional electrical stimulation;Pain management;Self Care/advanced ADL retraining;UE/LE Coordination activities;Therapeutic Activities;Visual/perceptual remediation/compensation;Therapeutic Exercise;Skin care/wound managment;Patient/family education;Functional mobility training;Disease mangement/prevention;Cognitive remediation/compensation;Community reintegration;DME/adaptive equipment instruction;Neuromuscular re-education;Psychosocial support;Splinting/orthotics;UE/LE Strength taining/ROM;Wheelchair propulsion/positioning OT Self Feeding Anticipated Outcome(s): S OT Basic Self-Care Anticipated Outcome(s): MIN OT Toileting Anticipated Outcome(s): MIN A bathing; MOD A toilet OT Bathroom Transfers Anticipated Outcome(s): CGA toilet OT  Recommendation Patient destination: Home Follow Up Recommendations: Home health OT Equipment Recommended: 3 in 1 bedside comode;To be determined   OT Evaluation Precautions/Restrictions  Precautions Precautions: Fall Precaution Comments: R neglect, R hemi General Chart Reviewed: Yes Family/Caregiver Present: No Vital Signs  Pain Pain Assessment Pain Scale: 0-10 Home Living/Prior Functioning Home Living Family/patient expects to be discharged to:: Private residence (w daughter) Living Arrangements: Children (w daughter) Available Help at Discharge: Family Type of Home: House Home Access: Level entry Home Layout: Multi-level, Able to live on main level with bedroom/bathroom, Other (Comment), 1/2 bath on main level Alternate Level Stairs-Number of Steps: flight Bathroom Shower/Tub: Walk-in shower (only half bath  downstairs) Bathroom Accessibility: Yes  Lives With: Daughter 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 Vision Baseline Vision/History: Wears glasses Wears Glasses: At all times Vision Assessment?: Vision impaired- to be further tested in functional context;Yes Eye Alignment: Within Functional Limits Ocular Range of Motion: Within Functional Limits Alignment/Gaze Preference: Head turned Tracking/Visual Pursuits: Decreased smoothness of horizontal tracking (R) Saccades: Additional eye shifts occurred during testing;Undershoots Perception  Perception: Impaired Inattention/Neglect: Does not attend to right visual field;Does not attend to right side of body Praxis Praxis: Impaired Praxis Impairment Details: Motor planning;Initiation Cognition Overall Cognitive Status: Difficult to assess Arousal/Alertness: Awake/alert Orientation Level: Nonverbal/unable to assess Memory: Impaired Attention: Sustained Focused Attention: Appears intact Sustained Attention: Impaired Awareness: Impaired Problem Solving: Impaired Safety/Judgment:  Impaired Sensation Sensation Light Touch: Appears Intact Hot/Cold: Appears Intact Coordination Gross Motor Movements are Fluid and Coordinated: No Fine Motor Movements are Fluid and Coordinated: No Coordination and Movement Description: dense R hemi Motor  Motor Motor: Hemiplegia;Abnormal postural alignment and control Motor - Skilled Clinical Observations: demse R hemi  Trunk/Postural Assessment  Cervical Assessment Cervical Assessment:  (gaze L) Thoracic Assessment Thoracic Assessment:  (rounded shoudlers) Lumbar Assessment Lumbar Assessment:  (post pelvic tilt) Postural Control Postural Control: Deficits on evaluation (delayed)  Balance Balance Balance Assessed: Yes Dynamic Sitting Balance Sitting balance - Comments: Ranging min guard- min assist to maintain balance. Facilitating weight bearing through R UE. Static Standing Balance Static Standing - Level of Assistance: 3: Mod assist Static Standing - Comment/# of Minutes: stedy Extremity/Trunk Assessment RUE Assessment RUE Assessment: Exceptions to [facility] RUE Body System: Neuro Brunstrum levels for arm and hand: Arm;Hand Brunstrum level for arm: Stage I Presynergy Brunstrum level for hand: Stage I Flaccidity LUE Assessment LUE Assessment: Within Functional Limits  Care Tool Care Tool Self Care Eating        Oral Care    Oral Care Assist Level: Moderate Assistance - Patient 50 - 74%    Bathing   Body parts bathed by patient: Chest;Abdomen;Front perineal area;Left upper leg;Face Body parts bathed by helper: Right arm;Left arm;Buttocks;Right upper leg;Right lower leg;Left lower leg   Assist Level: Maximal Assistance - Patient 24 - 49%    Upper Body Dressing(including orthotics)   What is the patient wearing?: Pull over shirt   Assist Level: Maximal Assistance - Patient 25 - 49%    Lower Body Dressing (excluding footwear)   What is the patient wearing?: Incontinence brief;Pants Assist for lower body dressing:  Dependent - Patient 0% (stedy)    Putting on/Taking off footwear   What is the patient wearing?: Non-skid slipper socks Assist for footwear: Dependent - Patient 0%       Care Tool Toileting Toileting activity   Assist for toileting: 2 Helpers     Care Tool Bed Mobility Roll left and right activity        Sit to lying activity        Lying to sitting edge of bed activity         Care Tool Transfers Sit to stand transfer        Chair/bed transfer   Chair/bed transfer assist level: Dependent - mechanical lift     Toilet transfer   Assist Level: Dependent - Patient 0%     Care Tool Cognition Expression of Ideas and Wants Expression of Ideas and Wants: Rarely/Never expressess or very difficult - rarely/never expresses self or speech is very difficult to understand   Understanding Verbal and Non-Verbal Content Understanding Verbal and Non-Verbal Content:  Sometimes understands - understands only basic conversations or simple, direct phrases. Frequently requires cues to understand   Memory/Recall Ability *first 3 days only Memory/Recall Ability *first 3 days only: None of the above were recalled    Refer to Care Plan for Long Term Goals  SHORT TERM GOAL WEEK 1 OT Short Term Goal 1 (Week 1): Pt will locate grooming items on sink level R of midline wiht MIN A OT Short Term Goal 2 (Week 1): Pt will recall hemi dressing techniques wiht MOD cues to don shirt OT Short Term Goal 3 (Week 1): Pt will transfer to toilet wiht MOD A and LRAD OT Short Term Goal 4 (Week 1): Pt will recall washing RUE with MIN VC  Recommendations for other services: None    Skilled Therapeutic Intervention Pt received in bed agreeable to OT. Pt completes all transfers in stedy with MOD A to power up and MAX VC for weight shift L perched in stedy. tp transfers to toilet in stedy with +2 A and then to TTB with +1 A. Pt bathes at shower level and unable to void on toilet. See below for details of ADL. Pt  demo R neglect, dense hemiplegia, poor visual scanning R, and fair command following throughout ADL. Edu re hemi dressing techniques. Pt able ot pick up RUE with L hand with MAX directional cues. Of note, pt incontinent of bladder onto floor while in stedy. By end of session pt able to maintain static sitting blaance at EOB with S-CGA. Exited session with pt sated in bed, exit alarm on an dcall light in reach   ADL ADL Grooming: Moderate assistance Where Assessed-Grooming: Edge of bed Upper Body Bathing: Moderate assistance Where Assessed-Upper Body Bathing: Shower Lower Body Bathing: Maximal assistance Where Assessed-Lower Body Bathing: Shower Upper Body Dressing: Maximal assistance Where Assessed-Upper Body Dressing: Wheelchair Lower Body Dressing: Dependent Where Assessed-Lower Body Dressing: Wheelchair;Other (Comment) (stedy) Toileting: Dependent (stedy) Where Assessed-Toileting: Bedside Commode Toilet Transfer: Dependent Toilet Transfer Method:  (stedy) Science writer: Drop arm bedside Microbiologist: Dependent (stedy) Mobility  Bed Mobility Bed Mobility: Supine to Sit Supine to Sit: Moderate Assistance - Patient 50-74% (bed rails, HOB elevated)   Discharge Criteria: Patient will be discharged from OT if patient refuses treatment 3 consecutive times without medical reason, if treatment goals not met, if there is a change in medical status, if patient makes no progress towards goals or if patient is discharged from hospital.  The above assessment, treatment plan, treatment alternatives and goals were discussed and mutually agreed upon: by patient  [place] 05/01/2020, 10:15 AM

## 2020-05-01 NOTE — Progress Notes (Signed)
Inpatient Rehabilitation  Patient information reviewed and entered into eRehab system by Gwenda Heiner M. Amberle Lyter, M.A., CCC/SLP, PPS Coordinator.  Information including medical coding, functional ability and quality indicators will be reviewed and updated through discharge.    

## 2020-05-02 DIAGNOSIS — G8191 Hemiplegia, unspecified affecting right dominant side: Secondary | ICD-10-CM

## 2020-05-02 LAB — GLUCOSE, CAPILLARY
Glucose-Capillary: 127 mg/dL — ABNORMAL HIGH (ref 70–99)
Glucose-Capillary: 104 mg/dL — ABNORMAL HIGH (ref 70–99)
Glucose-Capillary: 106 mg/dL — ABNORMAL HIGH (ref 70–99)
Glucose-Capillary: 117 mg/dL — ABNORMAL HIGH (ref 70–99)

## 2020-05-02 NOTE — Progress Notes (Signed)
[place] [facility] [place] PHYSICAL MEDICINE & REHABILITATION PROGRESS NOTE   Subjective/Complaints:  Pt reports she has no pain; and denies constipation-   Her BP 152/126 this AM  ROS: limited by aphasia  Objective:   No results found. Recent Labs    05/01/20 0708  WBC 4.4  HGB 12.4  HCT 36.7  PLT 339   Recent Labs    05/01/20 0708  NA 132*  K 4.2  CL 99  CO2 22  GLUCOSE 119*  BUN 16  CREATININE 0.56  CALCIUM 9.2    Intake/Output Summary (Last 24 hours) at 05/02/2020 1548 Last data filed at 05/02/2020 0900 Gross per 24 hour  Intake 100 ml  Output --  Net 100 ml     Physical Exam: Vital Signs Blood pressure (!) 152/126, pulse 53, temperature 98.4 F (36.9 C), temperature source Oral, resp. rate 16, height 5\' 5"  (1.651 m), weight 75.8 kg, SpO2 100 %.  General: alert, appropriate, aphasic, responds in 1 word answers, NAD, sitting up in bedside chair HEENT: L gaze preference; conjugate gaze; tongue coated Neck: Supple without JVD or lymphadenopathy Heart: RRR Chest: CTA B/L- no W/R/R- good air movement Abdomen: Soft, NT, ND, (+)BS  Extremities: No clubbing, cyanosis, or edema. Pulses are 2+ Skin: IV in place; C/D/I Neuro: Expressive>receptive aphasia- no change. Right sided hemiplegia. Left side 5/5 Psych: flat affect  Assessment/Plan: 1. 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  Physiatrist is providing close team supervision and 24 hour management of active medical problems listed below.  Physiatrist and rehab team continue to assess barriers to discharge/monitor patient progress toward functional and medical goals  Care Tool:  Bathing    Body parts bathed by patient: Chest, Abdomen, Front perineal area, Left upper leg, Face   Body parts bathed by helper: Right arm, Left arm, Buttocks, Right upper leg, Right lower leg, Left lower leg      Bathing assist Assist Level: Maximal Assistance - Patient 24 - 49%     Upper Body Dressing/Undressing Upper body dressing   What is the patient wearing?: Pull over shirt    Upper body assist Assist Level: Maximal Assistance - Patient 25 - 49%    Lower Body Dressing/Undressing Lower body dressing      What is the patient wearing?: Incontinence brief, Pants     Lower body assist Assist for lower body dressing: Dependent - Patient 0% (stedy)     Toileting Toileting    Toileting assist Assist for toileting: Moderate Assistance - Patient 50 - 74%     Transfers Chair/bed transfer  Transfers assist  Chair/bed transfer activity did not occur: Safety/medical concerns  Chair/bed transfer assist level: Minimal Assistance - Patient > 75%     Locomotion Ambulation   Ambulation assist   Ambulation activity did not occur: Safety/medical concerns  Assist level: Minimal Assistance - Patient > 75% Assistive device: Walker-rolling     Walk 10 feet activity   Assist  Walk 10 feet activity did not occur: Safety/medical concerns        Walk 50 feet activity   Assist Walk 50 feet with 2 turns activity did not occur: Safety/medical concerns         Walk 150 feet activity   Assist Walk 150 feet activity did not occur: Safety/medical concerns         Walk 10 feet on uneven surface  activity  Assist Walk 10 feet on uneven surfaces activity did not occur: Safety/medical concerns         Wheelchair     Assist Will patient use wheelchair at discharge?: Yes Type of Wheelchair: Manual    Wheelchair assist level: Moderate Assistance - Patient 50 - 74% Max wheelchair distance: 10 ft    Wheelchair 50 feet with 2 turns activity    Assist        Assist Level: Maximal Assistance - Patient 25 - 49%   Wheelchair 150 feet activity     Assist      Assist Level: Total Assistance - Patient < 25%   Blood pressure (!) 152/126, pulse 53,  temperature 98.4 F (36.9 C), temperature source Oral, resp. rate 16, height 5\' 5"  (1.651 m), weight 75.8 kg, SpO2 100 %.    Medical Problem List and Plan: 1.  R hemiplegia and impaired function/expressive aphasia and R inattention secondary to L MCA stroke with hemorrhagic extension- with dysphagia- on D2 with thins             -patient may  shower             -ELOS/Goals: 2.5 to 3 weeks- goals Mod I to min A 2.  Antithrombotics: -DVT/anticoagulation:  Pharmaceutical: Lovenox             -antiplatelet therapy:  ASA due to bleed. Discussed with stroke NP--will order CT head on Monday am and contact neurology for input on [facility]  3. Pain Management: Tylenol prn. Well controlled  7/31- denies pain- con't tylenol prn 4. Mood: LCSW to follow for evaluation and support.              -antipsychotic agents: N/a 5. Neuropsych: This patient is not fully capable of making decisions on her  own behalf. 6. Skin/Wound Care: Routine pressure relief measures. IVs may be removed. 7. Fluids/Electrolytes/Nutrition: Monitor I/O. Na is 132. Repeat on Monday 8. A fib with RVR: Monitor HR bid--continue metoprolol 75 mg bid. HR remains 90-120 range in past 24 hours. NO AC at this time due to bleed.  9. HTN: Monitor BP tid.  On Lopressor 75 mg bid  7/31- HR 53, but BP 150s/126- DBP very high- recent stroke- will monitor- if add agent, cannot increase bblocker.  10 Dysphagia: On Dysphagia 2, thin liquids. Encourage intake. Will have SLP and reassess swallow per SLP. 11. ETOH abuse: Has not had issues with withdrawal.  12. Prediabetes: Hgb A1c-6.1. Monitor CBGs ac/hs. Add CM restrictions. RD to educate patient/family on CM diet.    CBG (last 3)  Recent Labs    05/01/20 2106 05/02/20 0845 05/02/20 1214  GLUCAP 138* 127* 104*    7/31- BGs 104-138- well controlled- con't regimen 13. ABLA: Recheck CBC stable 7/30   LOS: 2 days A FACE TO FACE EVALUATION WAS PERFORMED  Lindsey [name] 05/02/2020, 3:48 PM

## 2020-05-03 ENCOUNTER — Inpatient Hospital Stay (HOSPITAL_COMMUNITY): Payer: Self-pay | Admitting: Occupational Therapy

## 2020-05-03 ENCOUNTER — Inpatient Hospital Stay (HOSPITAL_COMMUNITY): Payer: Self-pay

## 2020-05-03 LAB — GLUCOSE, CAPILLARY
Glucose-Capillary: 116 mg/dL — ABNORMAL HIGH (ref 70–99)
Glucose-Capillary: 133 mg/dL — ABNORMAL HIGH (ref 70–99)
Glucose-Capillary: 88 mg/dL (ref 70–99)
Glucose-Capillary: 133 mg/dL — ABNORMAL HIGH (ref 70–99)

## 2020-05-03 NOTE — Progress Notes (Signed)
Occupational Therapy Session Note  Patient Details  Name: Lindsey [name] MRN: [id number] Date of Birth: Oct 08, 1955  Today's Date: 05/03/2020 OT Individual Time: 3546-5681 OT Individual Time Calculation (min): 42 min   Short Term Goals: Week 1:  OT Short Term Goal 1 (Week 1): Pt will locate grooming items on sink level R of midline wiht MIN A OT Short Term Goal 2 (Week 1): Pt will recall hemi dressing techniques wiht MOD cues to don shirt OT Short Term Goal 3 (Week 1): Pt will transfer to toilet wiht MOD A and LRAD OT Short Term Goal 4 (Week 1): Pt will recall washing RUE with MIN VC  Skilled Therapeutic Interventions/Progress Updates:    Pt greeted in the w/c with no s/s pain. Agreeable to start session by brushing her teeth. She engaged in oral care, face washing, hand washing, and lotion application while sitting at the sink. HOH to involve flaccid Rt UE during stated tasks with education provided regarding joint protection when lifting her affected arm on and off of sink. OT placed needed items on her Rt side to promote scanning. Afterwards educated daughter, Monica Martinez, regarding PROM techniques for the Rt arm. After OT provided explanations and demonstrations, Monica Martinez able to demonstrate carryover of education by assisting with ROM for the shoulder, elbow, wrist, and digits. At end of session pt remained sitting in the w/c with daughter at bedside, half lap tray, and safety belt fastened.   Therapy Documentation Precautions:  Precautions Precautions: Fall Precaution Comments: R neglect, R hemi Restrictions Weight Bearing Restrictions: No ADL: ADL Grooming: Moderate assistance Where Assessed-Grooming: Edge of bed Upper Body Bathing: Moderate assistance Where Assessed-Upper Body Bathing: Shower Lower Body Bathing: Maximal assistance Where Assessed-Lower Body Bathing: Shower Upper Body Dressing: Maximal assistance Where Assessed-Upper Body Dressing: Wheelchair Lower Body Dressing:  Dependent Where Assessed-Lower Body Dressing: Wheelchair, Other (Comment) (stedy) Toileting: Dependent (stedy) Where Assessed-Toileting: Bedside Commode Toilet Transfer: Dependent Toilet Transfer Method:  (stedy) Acupuncturist: Drop arm bedside Corporate investment banker: Dependent (stedy)      Therapy/Group: Individual Therapy  Akim Watkinson A Jshawn Hurta 05/03/2020, 12:24 PM

## 2020-05-03 NOTE — Plan of Care (Signed)
  Problem: Consults Goal: RH STROKE PATIENT EDUCATION Description: See Patient Education module for education specifics  Outcome: Progressing Goal: Nutrition Consult-if indicated Outcome: Progressing Goal: Diabetes Guidelines if Diabetic/Glucose > 140 Description: If diabetic or lab glucose is > 140 mg/dl - Initiate Diabetes/Hyperglycemia Guidelines & Document Interventions  Outcome: Progressing   Problem: RH BOWEL ELIMINATION Goal: RH STG MANAGE BOWEL WITH ASSISTANCE Description: STG Manage Bowel with Assistance. Outcome: Progressing Goal: RH STG MANAGE BOWEL W/MEDICATION W/ASSISTANCE Description: STG Manage Bowel with Medication with Assistance. Outcome: Progressing   Problem: RH BLADDER ELIMINATION Goal: RH STG MANAGE BLADDER WITH ASSISTANCE Description: STG Manage Bladder With Assistance Outcome: Progressing Goal: RH STG MANAGE BLADDER WITH MEDICATION WITH ASSISTANCE Description: STG Manage Bladder With Medication With Assistance. Outcome: Progressing Goal: RH STG MANAGE BLADDER WITH EQUIPMENT WITH ASSISTANCE Description: STG Manage Bladder With Equipment With Assistance Outcome: Progressing   Problem: RH SKIN INTEGRITY Goal: RH STG SKIN FREE OF INFECTION/BREAKDOWN Outcome: Progressing Goal: RH STG MAINTAIN SKIN INTEGRITY WITH ASSISTANCE Description: STG Maintain Skin Integrity With Assistance. Outcome: Progressing Goal: RH STG ABLE TO PERFORM INCISION/WOUND CARE W/ASSISTANCE Description: STG Able To Perform Incision/Wound Care With Assistance. Outcome: Progressing   Problem: RH SAFETY Goal: RH STG ADHERE TO SAFETY PRECAUTIONS W/ASSISTANCE/DEVICE Description: STG Adhere to Safety Precautions With Assistance/Device. Outcome: Progressing Goal: RH STG DECREASED RISK OF FALL WITH ASSISTANCE Description: STG Decreased Risk of Fall With Assistance. Outcome: Progressing   Problem: RH COGNITION-NURSING Goal: RH STG USES MEMORY AIDS/STRATEGIES W/ASSIST TO PROBLEM  SOLVE Description: STG Uses Memory Aids/Strategies With Assistance to Problem Solve. Outcome: Progressing Goal: RH STG ANTICIPATES NEEDS/CALLS FOR ASSIST W/ASSIST/CUES Description: STG Anticipates Needs/Calls for Assist With Assistance/Cues. Outcome: Progressing   Problem: RH PAIN MANAGEMENT Goal: RH STG PAIN MANAGED AT OR BELOW PT'S PAIN GOAL Outcome: Progressing   Problem: RH KNOWLEDGE DEFICIT Goal: RH STG INCREASE KNOWLEDGE OF DIABETES Outcome: Progressing Goal: RH STG INCREASE KNOWLEDGE OF HYPERTENSION Outcome: Progressing Goal: RH STG INCREASE KNOWLEDGE OF DYSPHAGIA/FLUID INTAKE Outcome: Progressing Goal: RH STG INCREASE KNOWLEGDE OF HYPERLIPIDEMIA Outcome: Progressing Goal: RH STG INCREASE KNOWLEDGE OF STROKE PROPHYLAXIS Outcome: Progressing   Problem: RH Vision Goal: RH LTG Vision (Specify) Outcome: Progressing   

## 2020-05-03 NOTE — Progress Notes (Signed)
Occupational Therapy Session Note  Patient Details  Name: Lindsey [name] MRN: [id number] Date of Birth: January 25, 1956  Today's Date: 05/03/2020 OT Individual Time: 1250-1402 OT Individual Time Calculation (min): 72 min    Short Term Goals: Week 1:  OT Short Term Goal 1 (Week 1): Pt will locate grooming items on sink level R of midline wiht MIN A OT Short Term Goal 2 (Week 1): Pt will recall hemi dressing techniques wiht MOD cues to don shirt OT Short Term Goal 3 (Week 1): Pt will transfer to toilet wiht MOD A and LRAD OT Short Term Goal 4 (Week 1): Pt will recall washing RUE with MIN VC  Skilled Therapeutic Interventions/Progress Updates:    Pt greeted while sitting on toilet using Stedy. RN and NT present. Pt had continent void of bowels with bowel incontinence in brief beforehand. Min A for sit<stand in [place] in order for OT to complete perihygiene. Pt required manual facilitation for neutral midline due to Rt lean. Transitioned to TTB using Stedy after where pt then bathed while seated. HOH to incorporate the Rt hand to wash Lt side of body. Tactile, manual, and verbal cues for sequencing. She did well with maintaining sitting balance at supervision level ~90% of the time, Min A when assisting OT with perihygiene. Afterwards pt dressed w/c level sit<stand at the sink. Mod A for power ups with Rt knee blocked during static standing while OT lifted brief and pants over hips. Manual facilitation for upright trunk as she tended to lean forward over the sink in standing. Pt assisting therapist with dressing her Rt side given vcs today, exhibiting good participation. Her daughter was present throughout session, observing and asking appropriate OT related questions. At end of session pt completed squat pivot<bed with +2 assist (going towards unaffected side) and was boosted up in bed. Pt left in bed with hemiplegic side protected, all needs within reach, and bed alarm set. Tx focus placed on NMR, praxis, ADL  retraining, sit<stands, and transfers.   Therapy Documentation Precautions:  Precautions Precautions: Fall Precaution Comments: R neglect, R hemi Restrictions Weight Bearing Restrictions: No Vital Signs: Therapy Vitals Temp: 97.6 F (36.4 C) Temp Source: Oral Pulse Rate: 75 Resp: 16 BP: 123/79 Patient Position (if appropriate): Lying Oxygen Therapy SpO2: 99 % O2 Device: Room Air Pain:   ADL: ADL Grooming: Moderate assistance Where Assessed-Grooming: Edge of bed Upper Body Bathing: Moderate assistance Where Assessed-Upper Body Bathing: Shower Lower Body Bathing: Maximal assistance Where Assessed-Lower Body Bathing: Shower Upper Body Dressing: Maximal assistance Where Assessed-Upper Body Dressing: Wheelchair Lower Body Dressing: Dependent Where Assessed-Lower Body Dressing: Wheelchair, Other (Comment) (stedy) Toileting: Dependent (stedy) Where Assessed-Toileting: Bedside Commode Toilet Transfer: Dependent Toilet Transfer Method:  (stedy) Acupuncturist: Drop arm bedside Corporate investment banker: Dependent (stedy)      Therapy/Group: Individual Therapy  Aksel Bencomo A Eiman Maret 05/03/2020, 4:11 PM

## 2020-05-03 NOTE — Progress Notes (Signed)
Occupational Therapy Session Note  Patient Details  Name: Quaneisha Hanisch MRN: [id number] Date of Birth: [birth year]  Today's Date: 05/03/2020 OT Group Time: 1100-1200 OT Group Time Calculation (min): 60 min  Skilled Therapeutic Interventions/Progress Updates:    Pt engaged in therapeutic w/c level dance group focusing on patient choice, UE/LE strengthening, salience, activity tolerance, and social participation. Pt was guided through various dance-based exercises involving UEs/LEs and trunk. All music was selected by group members. Emphasis placed on Rt NMR, Rt attention, and praxis. Pt attended group with her daughter. HOH demonstration for implementing self ROM techniques for the affected UE/LE in beat to music. Pt sat forward without back support of chair during x2 songs with manual facilitation for trunk rotation, pt able to assist with this given manual feedback. Pt scanning towards her Rt side to view daughter and also other group members with cuing. At end of tx pts daughter returned her to room.    Therapy Documentation Precautions:  Precautions Precautions: Fall Precaution Comments: R neglect, R hemi Restrictions Weight Bearing Restrictions: No Pain: no s/s pain during tx    ADL: ADL Grooming: Moderate assistance Where Assessed-Grooming: Edge of bed Upper Body Bathing: Moderate assistance Where Assessed-Upper Body Bathing: Shower Lower Body Bathing: Maximal assistance Where Assessed-Lower Body Bathing: Shower Upper Body Dressing: Maximal assistance Where Assessed-Upper Body Dressing: Wheelchair Lower Body Dressing: Dependent Where Assessed-Lower Body Dressing: Wheelchair, Other (Comment) (stedy) Toileting: Dependent (stedy) Where Assessed-Toileting: Bedside Commode Toilet Transfer: Dependent Toilet Transfer Method:  (stedy) Toilet Transfer Equipment: Drop arm bedside Corporate investment banker: Dependent (stedy)      Therapy/Group: Group Therapy  Casmere Hollenbeck A  Jenicka Coxe 05/03/2020, 12:26 PM

## 2020-05-03 NOTE — Progress Notes (Signed)
[place] PHYSICAL MEDICINE & REHABILITATION PROGRESS NOTE   Subjective/Complaints:  Pt is working on standing- per PT< wants to try temporary AFO on R ankle to stabilize- perseverating on yes- no issues- denies pain.    ROS: limited by aphasia  Objective:   No results found. Recent Labs    05/01/20 0708  WBC 4.4  HGB 12.4  HCT 36.7  PLT 339   Recent Labs    05/01/20 0708  NA 132*  K 4.2  CL 99  CO2 22  GLUCOSE 119*  BUN 16  CREATININE 0.56  CALCIUM 9.2    Intake/Output Summary (Last 24 hours) at 05/03/2020 1545 Last data filed at 05/03/2020 1247 Gross per 24 hour  Intake 177 ml  Output --  Net 177 ml     Physical Exam: Vital Signs Blood pressure 123/79, pulse 75, temperature 97.6 F (36.4 C), temperature source Oral, resp. rate 16, height 5\' 5"  (1.651 m), weight 75 kg, SpO2 99 %.  General: alert, appropriate, standing in prallel bars with PT, aphasic, NAD HEENT: LL gaze preference Neck: Supple without JVD or lymphadenopathy Heart: RRR Chest: CTA B/L- no W/R/R- good air movement Abdomen: Soft, NT, ND, (+)BS  Extremities: No clubbing, cyanosis, or edema. Pulses are 2+ Skin: IV in place; C/D/I Neuro: Expressive>receptive aphasia- also perseverating on yes today- couldn't say no, even though was shaking head no  Right sided hemiplegia. Left side 5/5 Psych: flat affect  Assessment/Plan: 1. 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  Physiatrist is providing close team supervision and 24 hour management of active medical problems listed below.  Physiatrist and rehab team continue to assess barriers to discharge/monitor patient progress toward functional and medical goals  Care Tool:  Bathing    Body parts bathed by patient: Chest, Abdomen, Front perineal area, Left upper leg, Face, Right upper leg, Right lower leg   Body parts  bathed by helper: Right arm, Left arm, Buttocks, Right lower leg, Left lower leg     Bathing assist Assist Level: Maximal Assistance - Patient 24 - 49%     Upper Body Dressing/Undressing Upper body dressing   What is the patient wearing?: Pull over shirt, Bra    Upper body assist Assist Level: Maximal Assistance - Patient 25 - 49%    Lower Body Dressing/Undressing Lower body dressing      What is the patient wearing?: Incontinence brief, Pants     Lower body assist Assist for lower body dressing: Maximal Assistance - Patient 25 - 49%     Toileting Toileting    Toileting assist Assist for toileting: Dependent - Patient 0% (using Stedy)     Transfers Chair/bed transfer  Transfers assist  Chair/bed transfer activity did not occur: Safety/medical concerns  Chair/bed transfer assist level: Minimal Assistance - Patient > 75%     Locomotion Ambulation   Ambulation assist   Ambulation activity did not occur: Safety/medical concerns  Assist level: Minimal Assistance - Patient > 75% Assistive device: Walker-rolling     Walk 10 feet activity   Assist  Walk 10 feet activity did not occur: Safety/medical concerns        Walk 50 feet activity   Assist Walk 50 feet with 2 turns activity did not occur: Safety/medical concerns         Walk 150 feet activity   Assist Walk 150 feet activity did not occur:  Safety/medical concerns         Walk 10 feet on uneven surface  activity   Assist Walk 10 feet on uneven surfaces activity did not occur: Safety/medical concerns         Wheelchair     Assist Will patient use wheelchair at discharge?: Yes Type of Wheelchair: Manual    Wheelchair assist level: Moderate Assistance - Patient 50 - 74% Max wheelchair distance: 10 ft    Wheelchair 50 feet with 2 turns activity    Assist        Assist Level: Maximal Assistance - Patient 25 - 49%   Wheelchair 150 feet activity     Assist       Assist Level: Total Assistance - Patient < 25%   Blood pressure 123/79, pulse 75, temperature 97.6 F (36.4 C), temperature source Oral, resp. rate 16, height 5\' 5"  (1.651 m), weight 75 kg, SpO2 99 %.    Medical Problem List and Plan: 1.  R hemiplegia and impaired function/expressive aphasia and R inattention secondary to L MCA stroke with hemorrhagic extension- with dysphagia- on D2 with thins  8/1- PT to use temp R AFO since ankle is turning             -patient may  shower             -ELOS/Goals: 2.5 to 3 weeks- goals Mod I to min A 2.  Antithrombotics: -DVT/anticoagulation:  Pharmaceutical: Lovenox             -antiplatelet therapy:  ASA due to bleed. Discussed with stroke NP--will order CT head on Monday am and contact neurology for input on [facility].  3. Pain Management: Tylenol prn. Well controlled  7/31- denies pain- con't tylenol prn 4. Mood: LCSW to follow for evaluation and support.              -antipsychotic agents: N/a 5. Neuropsych: This patient is not fully capable of making decisions on her  own behalf. 6. Skin/Wound Care: Routine pressure relief measures. IVs may be removed. 7. Fluids/Electrolytes/Nutrition: Monitor I/O. Na is 132. Repeat on Monday 8. A fib with RVR: Monitor HR bid--continue metoprolol 75 mg bid. HR remains 90-120 range in past 24 hours. NO AC at this time due to bleed.  9. HTN: Monitor BP tid.  On Lopressor 75 mg bid  7/31- HR 53, but BP 150s/126- DBP very high- recent stroke- will monitor- if add agent, cannot increase bblocker  8/1- BP well controlled today- con't regimen.  10 Dysphagia: On Dysphagia 2, thin liquids. Encourage intake. Will have SLP and reassess swallow per SLP. 11. ETOH abuse: Has not had issues with withdrawal.  12. Prediabetes: Hgb A1c-6.1. Monitor CBGs ac/hs. Add CM restrictions. RD to educate patient/family on CM diet.    CBG (last 3)  Recent Labs    05/02/20 2108 05/03/20 0615 05/03/20 1215  GLUCAP 117* 116* 133*     8/1- BGs low 100s- con't regimen 13. ABLA: Recheck CBC stable 7/30   LOS: 3 days A FACE TO FACE EVALUATION WAS PERFORMED  Kerrianne Jeng 05/03/2020, 3:45 PM

## 2020-05-03 NOTE — Progress Notes (Signed)
Physical Therapy Session Note  Patient Details  Name: Lindsey [name] MRN: [id number] Date of Birth: Feb 09, 1956  Today's Date: 05/03/2020 PT Individual Time: 0800-0915 PT Individual Time Calculation (min): 75 min   Short Term Goals: Week 1:  PT Short Term Goal 1 (Week 1): Pt will complete bed<>w/c transfers with min assist. PT Short Term Goal 2 (Week 1): Pt will ambulate 30 ft with LRAD & max assist. PT Short Term Goal 3 (Week 1): Pt will propel w/c 75 ft with min assist. PT Short Term Goal 4 (Week 1): Pt will complete bed mobility with min assist. Week 2:    Week 3:     Skilled Therapeutic Interventions/Progress Updates:    PAIN   Pt initially supine, awake, alert.   Daugter at bedside. Therapist threads RLE thru pants but pt able to lift LLE to thread.  Pt rolls L and R w/mod assist, cues to atend to Rexts, use of rails, dependent to raise pants. Side to sit w/max assist. Worked on sitting balance/midline orientation, pt w/R lean, can self correct w/visual and verbal cues but maintains briefly due to limited attention to task.  Worked on upper body dressing and sitting balance, overall max assist for donning bra and shirt, cues and min to mod assist for balance.  STS w/max assist and daughter assisted w/fully raising pants. Squat pivot bed to wc mod assist to L side, therapist stabilizing R knee/ankle. Daughter w/questions regarding PT activities, discussed midline orientation/sitting/standing activities to promote in prep for gait.    Pt transported to gym.  For continued session.   STS in parallel bars w/mod assist, mirror for feedback, therapist blocking R knee/ankle. Worked on static midline orientation first w/mod assist and max cues using R UE support on bar, leans r w/tendency towards trunk flexion.    Repeated standing and progressed to maintaining static balance while moving L hand from bar to therapists shoulder/ then to therapist head w/min use of LUE/ therapist seated in  front of pt  Repeated standing and progressed to reaching to L w/LUE to place single peg in peg board, return to midline, corrects midline w/cues and min assist, repeats reaching task.  Noted some apraxia vs receptive aphasia w/this task.  Therapist guarding R knee/ankle w/all tasks.  Daughter in room and requested she bring shoes for pt/might benefit from pregait activities/might use aircast or AFO or acewrap to protect R ankle/flaccid at this time.  Suggest shoes for wc propulsion.  Pt occasionally able to voluntary elicit trace quads on R but very inconsistent.  At end of session, pt returned to room.  Pt left oob in wc w/alarm belt set and needs in reach     Therapy Documentation Precautions:  Precautions Precautions: Fall Precaution Comments: R neglect, R hemi Restrictions Weight Bearing Restrictions: No    Therapy/Group: Individual Therapy  Rada Hay, PT   Shearon Balo 05/03/2020, 12:52 PM

## 2020-05-03 NOTE — IPOC Note (Signed)
Overall Plan of Care [facility]) Patient Details Name: Lindsey [name] MRN: [id number] DOB: [birth year]  Admitting Diagnosis: Right hemiplegia [facility])  Hospital Problems: Principal Problem:   Right hemiplegia (HCC) Active Problems:   Acute ischemic left MCA stroke [facility])     Functional Problem List: Nursing Bladder, Bowel, Motor  PT Perception, Balance, Behavior, Safety, Edema, Sensory, Endurance, Motor, Pain  OT Balance, Cognition, Motor, Endurance, Perception, Safety, Sensory, Vision  SLP    TR         Basic ADL's: OT Eating, Grooming, Bathing, Dressing     Advanced  ADL's: OT       Transfers: PT Bed Mobility, Bed to Chair, Car, Furniture  OT Toilet (shower on second floor per chart review; Add goal as approprate)     Locomotion: PT Stairs, Wheelchair Mobility, Ambulation     Additional Impairments: OT Fuctional Use of Upper Extremity  SLP Swallowing, Communication expression, comprehension    TR      Anticipated Outcomes Item Anticipated Outcome  Self Feeding S  Swallowing      Basic self-care  MIN  Toileting  MIN A bathing; MOD A toilet   Bathroom Transfers CGA toilet  Bowel/Bladder  pt will be able to express needs for toileting and/or be able to hold until toleting times  Transfers  supervision<>min assist  Locomotion  supervision w/c, mod assist short distance gait  Communication     Cognition     Pain  pt will remain at a 0/10 pain  Safety/Judgment  pt will be able to participate in transfer safely with staff, understanding limitations and making adjustments accordingly and safely   Therapy Plan: PT Intensity: Minimum of 1-2 x/day ,45 to 90 minutes PT Frequency: 5 out of 7 days PT Duration Estimated Length of Stay: 3.5-4 weeks OT Intensity: Minimum of 1-2 x/day, 45 to 90 minutes OT Frequency: 5 out of 7 days OT Duration/Estimated Length of Stay: 3-4 weeks SLP Intensity: Minumum of 1-2 x/day, 30 to 90 minutes SLP Frequency: 3 to 5 out of 7  days SLP Duration/Estimated Length of Stay: 2.5 to 3 weeks.   Due to the current state of emergency, patients may not be receiving their 3-hours of Medicare-mandated therapy.   Team Interventions: Nursing Interventions Bladder Management, Bowel Management, Dysphagia/Aspiration Precaution Training  PT interventions Ambulation/gait training, Community reintegration, DME/adaptive equipment instruction, Neuromuscular re-education, Psychosocial support, Stair training, UE/LE Strength taining/ROM, Wheelchair propulsion/positioning, UE/LE Coordination activities, Therapeutic Activities, Skin care/wound management, Pain management, Functional electrical stimulation, Discharge planning, Balance/vestibular training, Cognitive remediation/compensation, Disease management/prevention, Functional mobility training, Patient/family education, Splinting/orthotics, Therapeutic Exercise, Visual/perceptual remediation/compensation  OT Interventions Balance/vestibular training, Discharge planning, Functional electrical stimulation, Pain management, Self Care/advanced ADL retraining, UE/LE Coordination activities, Therapeutic Activities, Visual/perceptual remediation/compensation, Therapeutic Exercise, Skin care/wound managment, Patient/family education, Functional mobility training, Disease mangement/prevention, Cognitive remediation/compensation, Community reintegration, Fish farm manager, Neuromuscular re-education, Psychosocial support, Splinting/orthotics, UE/LE Strength taining/ROM, Wheelchair propulsion/positioning  SLP Interventions Cognitive remediation/compensation, Financial trader, Functional tasks, Patient/family education, Speech/Language facilitation, Dysphagia/aspiration precaution training  TR Interventions    SW/CM Interventions Discharge Planning, Psychosocial Support, Patient/Family Education   Barriers to Discharge MD  Medical stability, Home enviroment access/loayout, New diabetic,  Lack of/limited family support, Weight bearing restrictions and Behavior  Nursing      PT      OT Weight, Inaccessible home environment, Home environment access/layout    SLP      SW       Team Discharge Planning: Destination: PT-Home ,OT- Home , SLP-  Projected Follow-up:  PT-Home health PT, 24 hour supervision/assistance, OT-  Home health OT, SLP-  Projected Equipment Needs: PT-To be determined, OT- 3 in 1 bedside comode, To be determined, SLP-  Equipment Details: PT- , OT-  Patient/family involved in discharge planning: PT- Patient, Family member/caregiver,  OT-Patient, SLP-   MD ELOS: 3-4 weeks Medical Rehab Prognosis:  Fair Assessment: Pt is a 64 yr old female with hx of  Severe/large L MCA stroke with dysphagia on D2 thins and significant aphasia- saying yes/no mainly at this time, perseverating; had Afib with RVR_ is improved; also A1c 6.1 so prediabetes which is new- needs family education.   Goals- Supervision to min A   See Team Conference Notes for weekly updates to the plan of care

## 2020-05-04 ENCOUNTER — Inpatient Hospital Stay (HOSPITAL_COMMUNITY): Payer: Self-pay

## 2020-05-04 ENCOUNTER — Inpatient Hospital Stay (HOSPITAL_COMMUNITY): Payer: Self-pay | Admitting: Occupational Therapy

## 2020-05-04 ENCOUNTER — Inpatient Hospital Stay (HOSPITAL_COMMUNITY): Payer: Medicaid Other

## 2020-05-04 ENCOUNTER — Inpatient Hospital Stay (HOSPITAL_COMMUNITY): Payer: Self-pay | Admitting: Speech Pathology

## 2020-05-04 LAB — CBC
HCT: 37.3 % (ref 36.0–46.0)
Hemoglobin: 12.5 g/dL (ref 12.0–15.0)
MCH: 29.7 pg (ref 26.0–34.0)
MCHC: 33.5 g/dL (ref 30.0–36.0)
MCV: 88.6 fL (ref 80.0–100.0)
Platelets: 457 10*3/uL — ABNORMAL HIGH (ref 150–400)
RBC: 4.21 MIL/uL (ref 3.87–5.11)
RDW: 14 % (ref 11.5–15.5)
WBC: 6.2 10*3/uL (ref 4.0–10.5)
nRBC: 0 % (ref 0.0–0.2)

## 2020-05-04 LAB — BASIC METABOLIC PANEL
Anion gap: 11 (ref 5–15)
BUN: 16 mg/dL (ref 8–23)
CO2: 21 mmol/L — ABNORMAL LOW (ref 22–32)
Calcium: 9.7 mg/dL (ref 8.9–10.3)
Chloride: 104 mmol/L (ref 98–111)
Creatinine, Ser: 0.59 mg/dL (ref 0.44–1.00)
GFR calc Af Amer: >60 mL/min (ref >60)
GFR calc non Af Amer: >60 mL/min (ref >60)
Glucose, Bld: 125 mg/dL — ABNORMAL HIGH (ref 70–99)
Potassium: 4.1 mmol/L (ref 3.5–5.1)
Sodium: 136 mmol/L (ref 135–145)

## 2020-05-04 LAB — GLUCOSE, CAPILLARY
Glucose-Capillary: 105 mg/dL — ABNORMAL HIGH (ref 70–99)
Glucose-Capillary: 115 mg/dL — ABNORMAL HIGH (ref 70–99)
Glucose-Capillary: 89 mg/dL (ref 70–99)
Glucose-Capillary: 109 mg/dL — ABNORMAL HIGH (ref 70–99)

## 2020-05-04 IMAGING — CT CT HEAD W/O CM
4 series · 17 of 47 positions shown, 19 images · non-contrast
Comparison: [DATE]

CLINICAL DATA: MCA stroke with hemorrhage, follow-up

EXAM:
CT HEAD WITHOUT CONTRAST
TECHNIQUE: Contiguous axial images were obtained from the base of the skull
through the vertex without intravenous contrast.

[Series 3: head wo · axial · 0.43mm/px · z∈[-126,-2]mm · 7 of 35 slices shown, 9 images]
[im 5/35  brain]
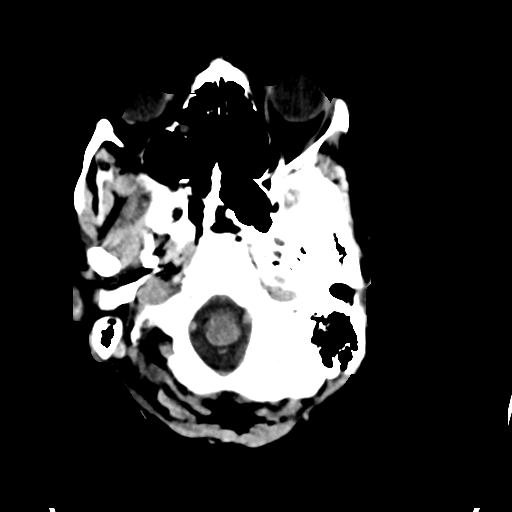
[im 5/35  bone]
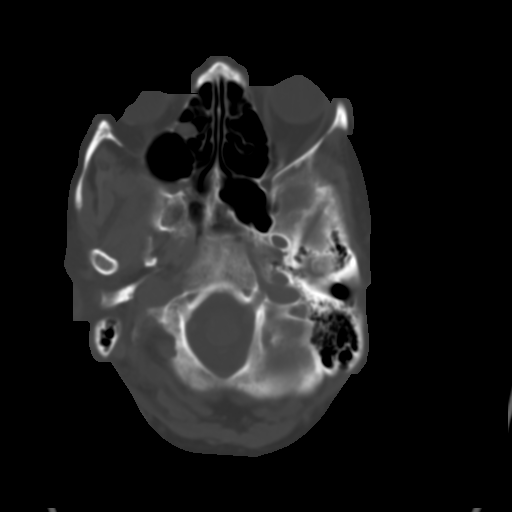
[im 9/35  brain]
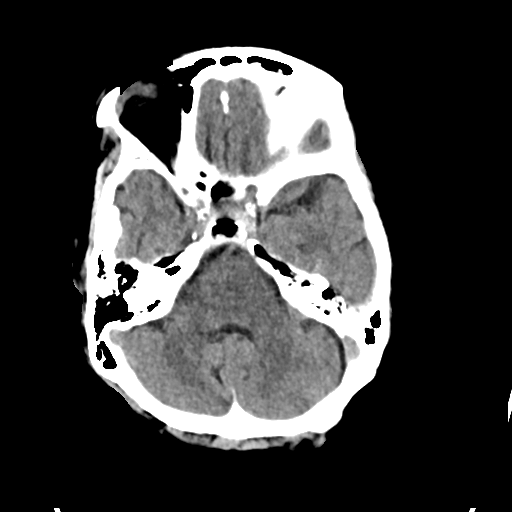
[im 13/35  brain]
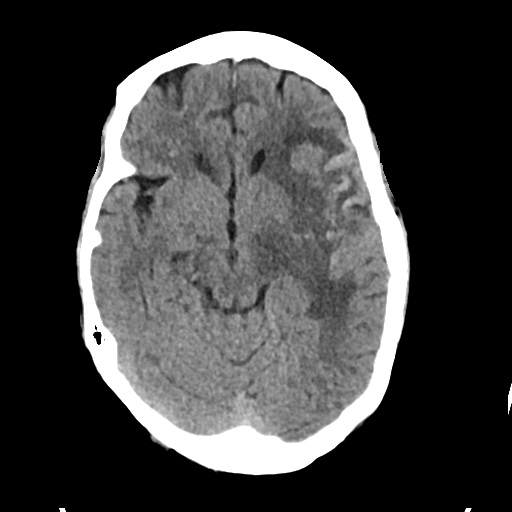
[im 18/35  brain]
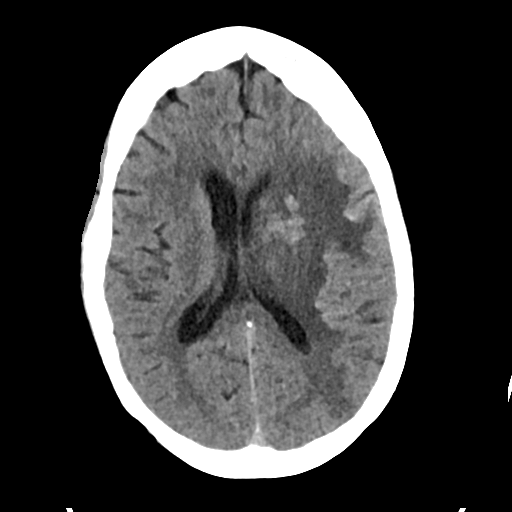
[im 22/35  brain]
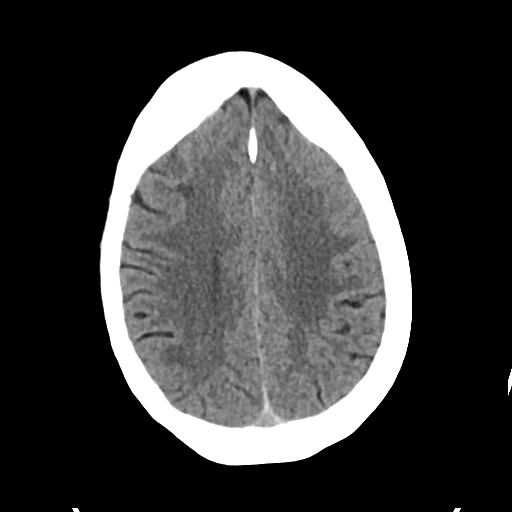
[im 22/35  bone]
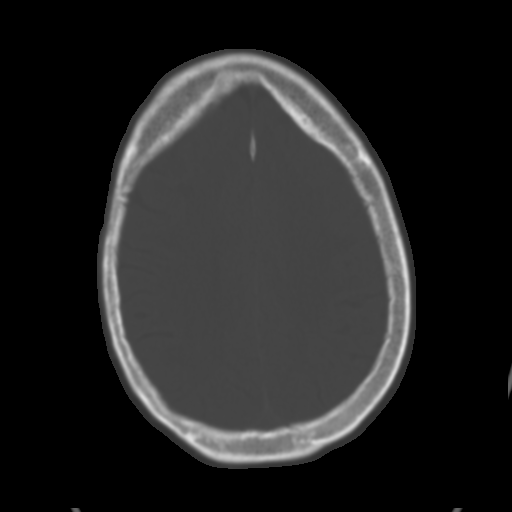
[im 26/35  brain]
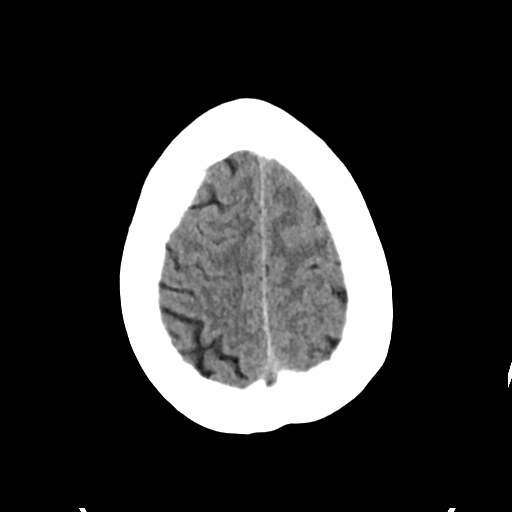
[im 30/35  brain]
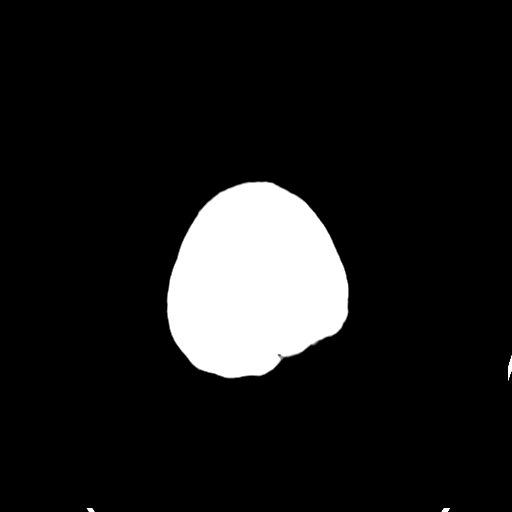

[Series 4: head bone · axial · 0.43mm/px · z∈[-130,-70]mm · 4 of 87 slices shown]
[im 9/87  bone]
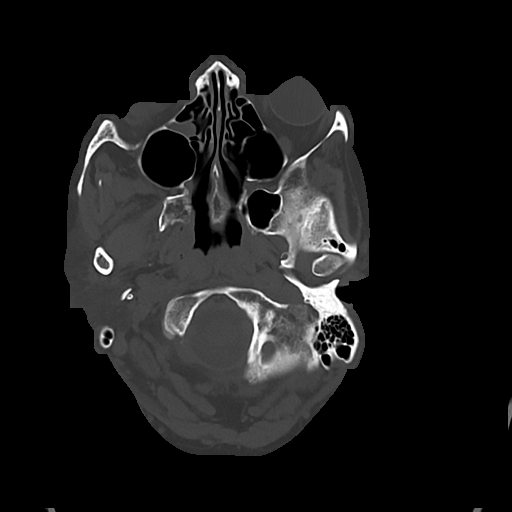
[im 18/87  bone]
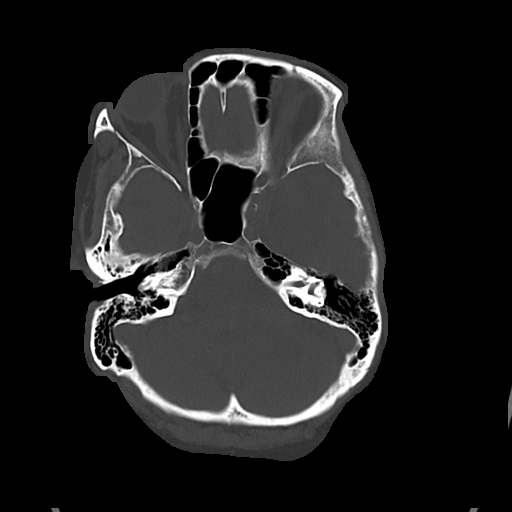
[im 26/87  bone]
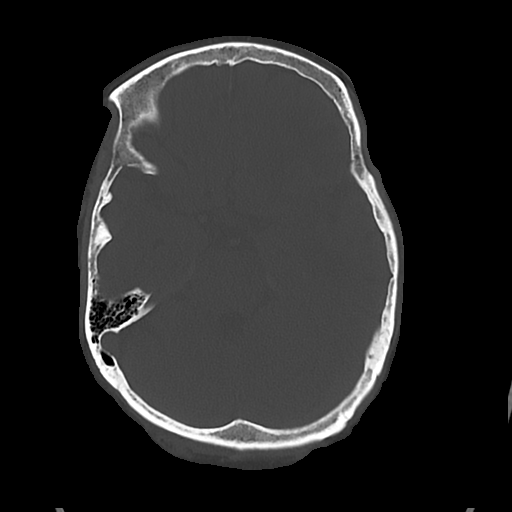
[im 39/87  bone]
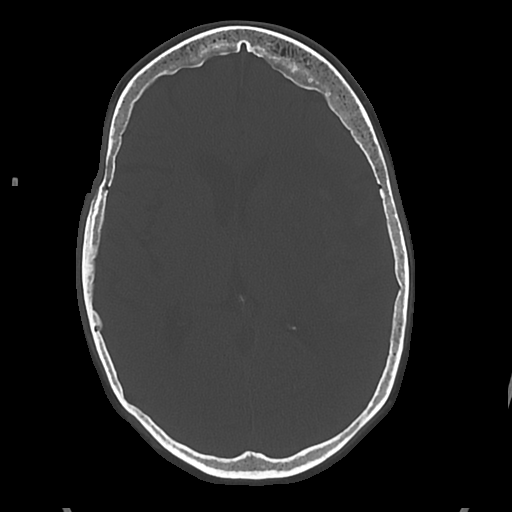

[Series 5: cor soft · coronal · 0.32mm/px · 3 of 76 slices shown]
[im 26/76  brain]
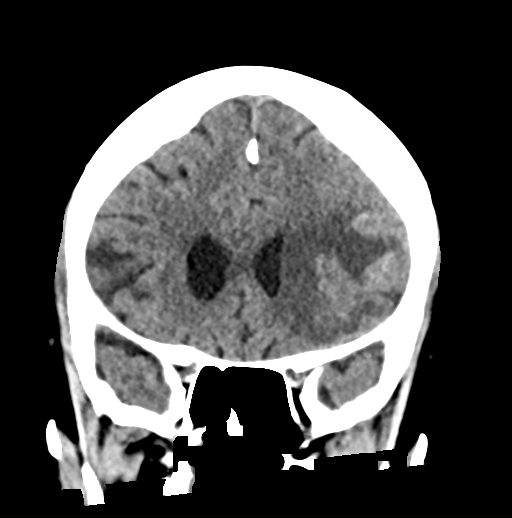
[im 34/76  brain]
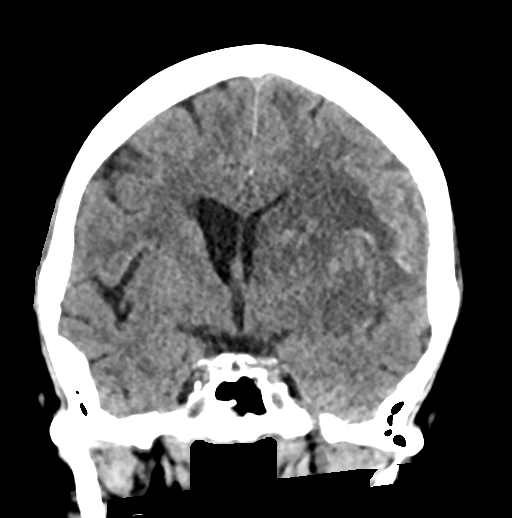
[im 42/76  brain]
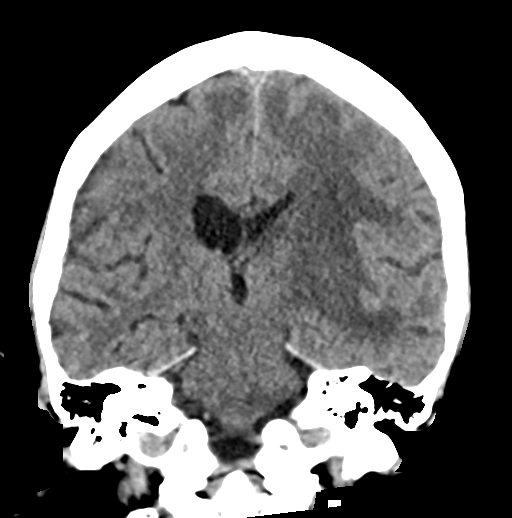

[Series 6: sag soft · sagittal · 0.35mm/px · 3 of 55 slices shown]
[im 20/55  brain]
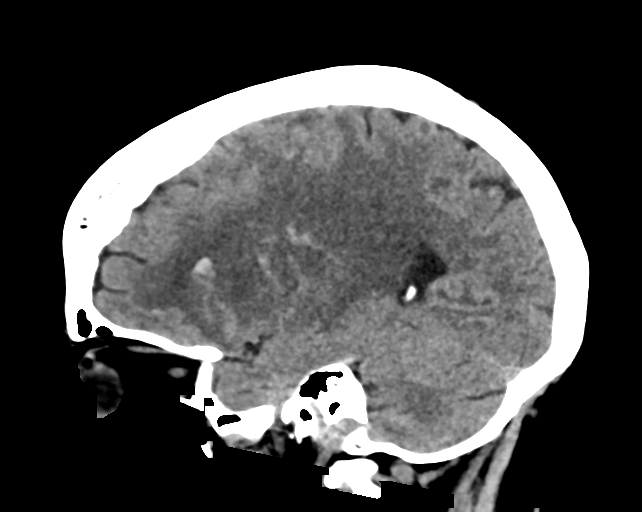
[im 28/55  brain]
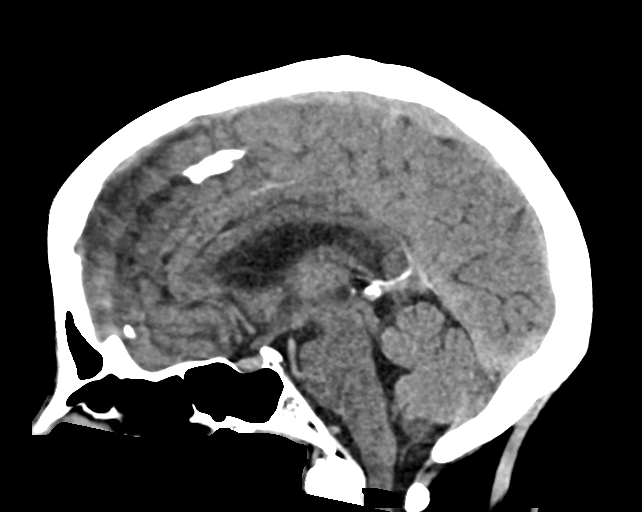
[im 35/55  brain]
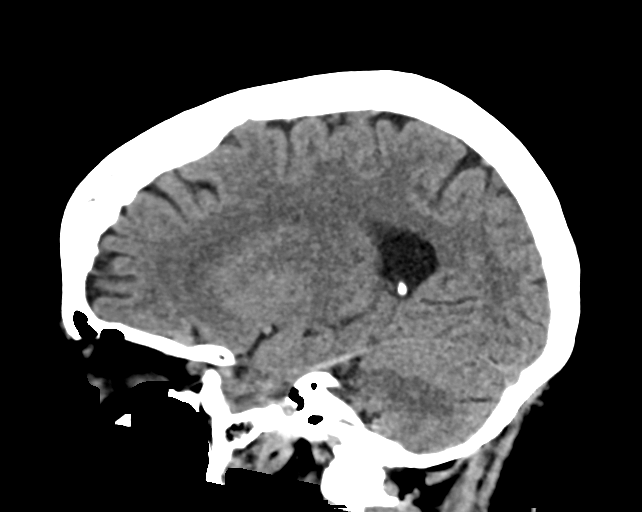

[17 of 47 positions shown; findings below may reference images not displayed]

FINDINGS: Brain: Evolving left MCA territory infarction is again identified.
Some improvement in areas of hyperdensity seen on the prior study
with other new areas of hyperdensity. There remains mild regional
mass effect and partial effacement the left lateral ventricle. No
significant midline shift. No hydrocephalus. No new loss of
gray-white differentiation.

Vascular: No new finding.

Skull: Calvarium is unremarkable.

Sinuses/Orbits: No acute finding.

Other: None.
IMPRESSION: Evolving subacute left MCA territory infarction. Persistent mild
residual mass effect. Some new areas of hyperdensity may reflect
laminar necrosis and/or hemorrhage.

## 2020-05-04 MED ORDER — AMLODIPINE BESYLATE 5 MG PO TABS
5.0000 mg | ORAL_TABLET | Freq: Every day | ORAL | Status: DC
  Administered 2020-05-04 – 2020-05-08 (×5): 5 mg via ORAL
  Filled 2020-05-04 (×5): qty 1

## 2020-05-04 NOTE — Progress Notes (Addendum)
Physical Therapy Session Note  Patient Details  Name: Lindsey [name] MRN: [id number] Date of Birth: 25-Feb-1956  Today's Date: 05/04/2020 PT Individual Time: 1005-1050 and 1300-1330 PT Individual Time Calculation (min): 45 min and 30 min   Short Term Goals: Week 1:  PT Short Term Goal 1 (Week 1): Pt will complete bed<>w/c transfers with min assist. PT Short Term Goal 2 (Week 1): Pt will ambulate 30 ft with LRAD & max assist. PT Short Term Goal 3 (Week 1): Pt will propel w/c 75 ft with min assist. PT Short Term Goal 4 (Week 1): Pt will complete bed mobility with min assist.  Skilled Therapeutic Interventions/Progress Updates:     Session 1: Patient in w/c with her daughter in the room upon PT arrival. Patient alert and agreeable to PT session. Patient denied pain during session. Patient inconsistent with nodding yes/no during session. Verbalized 1-5 word phrase, "I want to go home," and several single word responses throughout session. Required increased time and trials for word finding.   Therapeutic Activity: Bed Mobility: Patient performed sit to supinewith mod-max A for trunk and LE support. Provided verbal cues for lowering to her R elbow then to side-lying for improved trunk control. Performed scooting up in bed with max A of 1 person with cues for use of L LE to push herself up in the bed. Transfers: Patient performed squat pivot w/c<>mat table and sit to/from stand from elevated mat table with max A with PT blocking R knee. Provided verbal cues for sequencing, hand placement, head-hips relationship, and forward weight shift.  Wheelchair Mobility:  Patient was transported in the w/c with total A throughout session for energy conservation and time management.  Neuromuscular Re-ed: Patient performed the following LE and UE motor control activities to promote functional mobility: -sit to/from stand at L rail focused on forward weight shift and R quad and gluteal activation for knee/hip  control to come to standing and eccentric control for lowering, required max A and progressed to mod A -standing balance x3 10-35 sec with mod progressing to min A with cues for R quad and gluteal activation and multimodal cues for midline orientation due to mild R lean in standing -R UE elbow flexion/extension in gravity eliminated position 2x5 progressing from PROM to [facility] with >75% assist and PNF tapping for increased attention to UE and motor activation  Patient in bed wit her daughter in the room at end of session with breaks locked, bed alarm set, and all needs within reach.    Session 2: Patient in bed with her daughter at bedside upon PT arrival. Patient denied pain during session. Patient reported increased fatigue and frequently closed her eyes throughout session. Patient's daughter reports that she did not eat very much for lunch and that she has not been eating well. Patient shook her head no when asked if she liked the food, patient's daughter reports that she eats better with food from home. Discussed strategies to encourage patient's appetite. Patient not very motivated to eat or participate in discussion. Ask how she was feeling and she verbalized "bad" then "mad." when asked if she was mad/frustrated about her current condition, she nodded yes. Educated on stroke recovery, importance of nutritional intake and participation in therapy, and management of emotional impact of change in functional mobility and communication. Patient and daughter tearful, but receptive throughout. Educated about Neuropsychology and chaplain services, patient and daughter interested in neuropsych services. Will make MD aware. Educated patient's daughter on advocating for patient  and discussed setting up a plan to encourage feeding with staff. Also educated on encouraging attention to her R side and moved furniture to allow family to sit on her R side and require her to look R for the TV. Patient's daughter  appreciative of all education. Patient in bed asleep at end of session with breaks locked, bed alarm set, and all needs within reach.     Therapy Documentation Precautions:  Precautions Precautions: Fall Precaution Comments: R neglect, R hemi Restrictions Weight Bearing Restrictions: No    Therapy/Group: Individual Therapy  Shayann Garbutt L Oliva Montecalvo PT, DPT  05/04/2020, 4:47 PM

## 2020-05-04 NOTE — Progress Notes (Signed)
[place] PHYSICAL MEDICINE & REHABILITATION PROGRESS NOTE   Subjective/Complaints: No complaints this morning.  CT Head without contrast for f/u of hemorrhage shows evolving subacute left MCA infarction with new areas of hyperdensity that may reflect necrosis vs. Hemorrhage. Will discuss with neurology.   ROS: limited by aphasia  Objective:   CT HEAD WO CONTRAST  Result Date: 05/04/2020 CLINICAL DATA:  MCA stroke with hemorrhage, follow-up EXAM: CT HEAD WITHOUT CONTRAST TECHNIQUE: Contiguous axial images were obtained from the base of the skull through the vertex without intravenous contrast. COMPARISON:  04/24/2020 FINDINGS: Brain: Evolving left MCA territory infarction is again identified. Some improvement in areas of hyperdensity seen on the prior study with other new areas of hyperdensity. There remains mild regional mass effect and partial effacement the left lateral ventricle. No significant midline shift. No hydrocephalus. No new loss of gray-white differentiation. Vascular: No new finding. Skull: Calvarium is unremarkable. Sinuses/Orbits: No acute finding. Other: None. IMPRESSION: Evolving subacute left MCA territory infarction. Persistent mild residual mass effect. Some new areas of hyperdensity may reflect laminar necrosis and/or hemorrhage. Electronically Signed   By: Guadlupe Spanish M.D.   On: 05/04/2020 08:14   Recent Labs    05/04/20 0757  WBC 6.2  HGB 12.5  HCT 37.3  PLT 457*   Recent Labs    05/04/20 0757  NA 136  K 4.1  CL 104  CO2 21*  GLUCOSE 125*  BUN 16  CREATININE 0.59  CALCIUM 9.7    Intake/Output Summary (Last 24 hours) at 05/04/2020 1145 Last data filed at 05/04/2020 0836 Gross per 24 hour  Intake 407 ml  Output --  Net 407 ml     Physical Exam: Vital Signs Blood pressure (!) 141/100, pulse 82, temperature 98.7 F (37.1 C), temperature source Oral, resp. rate 18, height 5\' 5"  (1.651 m), weight 73.6 kg, SpO2 100 %. General: Alert and oriented x 3,  No apparent distress HEENT: Head is normocephalic, atraumatic, PERRLA, EOMI, sclera anicteric, oral mucosa pink and moist, dentition intact, ext ear canals clear,  Neck: Supple without JVD or lymphadenopathy Heart: Reg rate and rhythm. No murmurs rubs or gallops Chest: CTA bilaterally without wheezes, rales, or rhonchi; no distress Abdomen: Soft, non-tender, non-distended, bowel sounds positive. Extremities: No clubbing, cyanosis, or edema. Pulses are 2+ Skin:  C/D/I Neuro: Expressive>receptive aphasia- also perseverating on yes- couldn't say no, even though was shaking head no  Right sided hemiplegia. Left side 5/5 Psych: flat affect  Assessment/Plan: 1. 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  Physiatrist is providing close team supervision and 24 hour management of active medical problems listed below.  Physiatrist and rehab team continue to assess barriers to discharge/monitor patient progress toward functional and medical goals  Care Tool:  Bathing    Body parts bathed by patient: Chest, Abdomen, Front perineal area, Left upper leg, Face, Right upper leg, Right lower leg   Body parts bathed by helper: Right arm, Left arm, Buttocks, Right lower leg, Left lower leg     Bathing assist Assist Level: Maximal Assistance - Patient 24 - 49%     Upper Body Dressing/Undressing Upper body dressing   What is the patient wearing?: Pull over shirt    Upper body assist Assist Level: Maximal Assistance - Patient 25 - 49%    Lower Body Dressing/Undressing Lower body dressing      What is the  patient wearing?: Incontinence brief, Pants     Lower body assist Assist for lower body dressing: Maximal Assistance - Patient 25 - 49%     Toileting Toileting    Toileting assist Assist for toileting: Total Assistance - Patient < 25%     Transfers Chair/bed  transfer  Transfers assist  Chair/bed transfer activity did not occur: Safety/medical concerns  Chair/bed transfer assist level: Minimal Assistance - Patient > 75%     Locomotion Ambulation   Ambulation assist   Ambulation activity did not occur: Safety/medical concerns  Assist level: Minimal Assistance - Patient > 75% Assistive device: Walker-rolling     Walk 10 feet activity   Assist  Walk 10 feet activity did not occur: Safety/medical concerns        Walk 50 feet activity   Assist Walk 50 feet with 2 turns activity did not occur: Safety/medical concerns         Walk 150 feet activity   Assist Walk 150 feet activity did not occur: Safety/medical concerns         Walk 10 feet on uneven surface  activity   Assist Walk 10 feet on uneven surfaces activity did not occur: Safety/medical concerns         Wheelchair     Assist Will patient use wheelchair at discharge?: Yes Type of Wheelchair: Manual    Wheelchair assist level: Moderate Assistance - Patient 50 - 74% Max wheelchair distance: 10 ft    Wheelchair 50 feet with 2 turns activity    Assist        Assist Level: Maximal Assistance - Patient 25 - 49%   Wheelchair 150 feet activity     Assist      Assist Level: Total Assistance - Patient < 25%   Blood pressure (!) 141/100, pulse 82, temperature 98.7 F (37.1 C), temperature source Oral, resp. rate 18, height 5\' 5"  (1.651 m), weight 73.6 kg, SpO2 100 %.    Medical Problem List and Plan: 1.  R hemiplegia and impaired function/expressive aphasia and R inattention secondary to L MCA stroke with hemorrhagic extension- with dysphagia- on D2 with thins  8/1- PT to use temp R AFO since ankle is turning             -patient may  shower             -ELOS/Goals: 2.5 to 3 weeks- goals Mod I to min A  -Continue CIR  CT Head without contrast for f/u of hemorrhage shows evolving subacute left MCA infarction with new areas of  hyperdensity that may reflect necrosis vs. Hemorrhage. Will discuss with neurology. 2.  Antithrombotics: -DVT/anticoagulation:  Pharmaceutical: Lovenox             -antiplatelet therapy:  ASA due to bleed. Discussed with stroke NP--will order CT head on Monday am and contact neurology for input on [facility].  3. Pain Management: Tylenol prn. Well controlled 4. Mood: LCSW to follow for evaluation and support.              -antipsychotic agents: N/a 5. Neuropsych: This patient is not fully capable of making decisions on her  own behalf. 6. Skin/Wound Care: Routine pressure relief measures. IVs may be removed. 7. Fluids/Electrolytes/Nutrition: Monitor I/O. Na is 136 on 8/2 8. A fib with RVR: Monitor HR bid--continue metoprolol 75 mg bid. HR remains 90-120 range in past 24 hours. NO AC at this time due to bleed.  9. HTN: Monitor BP tid.  On Lopressor 75 mg bid  8/2: diastolic continues to be elevated. Add amlodipine 5mg .  10 Dysphagia: On Dysphagia 2, thin liquids. Encourage intake. Will have SLP and reassess swallow per SLP. 11. ETOH abuse: Has not had issues with withdrawal.  12. Prediabetes: Hgb A1c-6.1. Monitor CBGs ac/hs. Add CM restrictions. RD to educate patient/family on CM diet.    CBG (last 3)  Recent Labs    05/03/20 2105 05/04/20 0558 05/04/20 1129  GLUCAP 133* 105* 115*    8/2: well controlled 13. ABLA: Hgb 12.5 on 8/2.   LOS: 4 days A FACE TO FACE EVALUATION WAS PERFORMED  07/04/20 P Dewane Timson 05/04/2020, 11:45 AM

## 2020-05-04 NOTE — Progress Notes (Signed)
Occupational Therapy Session Note  Patient Details  Name: Lindsey [name] MRN: [id number] Date of Birth: 16-Apr-1956  Today's Date: 05/04/2020 OT Missed Time: 45 Minutes Missed Time Reason: Patient unwilling/refused to participate without medical reason  Upon entering room, pt supine with NT about to begin bladder scan. Pt nodding her head no when asked about participation in therapy. Offered several options and pt declining, not making eye contact with therapist. 45 min missed skilled OT.     Therapy Documentation Precautions:  Precautions Precautions: Fall Precaution Comments: R neglect, R hemi Restrictions Weight Bearing Restrictions: No  Therapy/Group: Individual Therapy  Crissie Reese 05/04/2020, 6:43 AM

## 2020-05-04 NOTE — Progress Notes (Signed)
CT head evaluated by neurology who recommends due to bleed (could be lamina necrosis) to repeating CT in a week to help decide on initiation of AC.

## 2020-05-04 NOTE — Progress Notes (Signed)
Occupational Therapy Session Note  Patient Details  Name: Lindsey [name] MRN: [id number] Date of Birth: [birth year]  Today's Date: 05/04/2020 OT Individual Time: 8483-5075 OT Individual Time Calculation (min): 32 min    Short Term Goals: Week 1:  OT Short Term Goal 1 (Week 1): Pt will locate grooming items on sink level R of midline wiht MIN A OT Short Term Goal 2 (Week 1): Pt will recall hemi dressing techniques wiht MOD cues to don shirt OT Short Term Goal 3 (Week 1): Pt will transfer to toilet wiht MOD A and LRAD OT Short Term Goal 4 (Week 1): Pt will recall washing RUE with MIN VC  Skilled Therapeutic Interventions/Progress Updates:  Patient met lying supine in bed in agreement with OT treatment session with focus on RUE NMR, self-care re-education and functional transfers as detailed below. Patient able to roll R<>L with Mod A and supine to EOB transfer with use of grab bar and Max A. UB dressing seated EOB with Max A and cues for hemi dressing technique and LB dressing seated EOB with Max A. Sit to stand from EOB to [facility] with Mod in prep for hiking pants over hips. Session concluded with patient seated in wc with call bell within reach, belt alarm activated, and all needs met.   Therapy Documentation Precautions:  Precautions Precautions: Fall Precaution Comments: R neglect, R hemi Restrictions Weight Bearing Restrictions: No General:    Therapy/Group: Individual Therapy  Marylyn Appenzeller R Howerton-Davis 05/04/2020, 9:38 AM

## 2020-05-04 NOTE — Progress Notes (Signed)
Speech Language Pathology Daily Session Note  Patient Details  Name: Lindsey [name] MRN: [id number] Date of Birth: [birth year]  Today's Date: 05/04/2020 SLP Individual Time: 1100-1155 SLP Individual Time Calculation (min): 55 min  Short Term Goals: Week 1: SLP Short Term Goal 1 (Week 1): Pt will increase oral clearance with dysphagia 2 diet with use of compensatory strategies x85% min A. SLP Short Term Goal 1 - Progress (Week 1): Progressing toward goal SLP Short Term Goal 2 (Week 1): Pt will increase ability to follow simple one step commands related to adls x85% min A. SLP Short Term Goal 2 - Progress (Week 1): Progressing toward goal SLP Short Term Goal 3 (Week 1): Pt will name functional objects on 5/10 trials with max multimodal cues. SLP Short Term Goal 3 - Progress (Week 1): Progressing toward goal SLP Short Term Goal 4 (Week 1): Pt will express wants/needs at phrase level with max verbal cues. SLP Short Term Goal 4 - Progress (Week 1): Progressing toward goal SLP Short Term Goal 5 (Week 1): Pt will answer (gestures or word level) simple yes/no questions with 70% accuracy.  Skilled Therapeutic Interventions: Skilled treatment session focused on swallowing and communication goals. SLP facilitated session by providing skilled observation with thin liquids. Patient consumed liquids via cup without overt s/s of aspiration and supervision verbal cues for use of swallowing compensatory strategies. Patient's daughter present and educated on current swallowing function, diet recommendations, appropriate textures and swallowing compensatory strategies. She verbalized understanding and provided appropriate cueing, therefore, she was signed off to provide supervision at meals. SLP also facilitated session by providing extra time for patient to identify objects from a field of 2 with 100% accuracy. However, patient unable to match an object to a written word. Patient also required Max A multimodal cues to  name functional items with 25% accuracy. Patient answered basic biographical and environmental yes/no questions with 100% accuracy and performed automatic speech tasks with Mod verbal cues needed to decrease perseveration. Patient left upright in bed with daughter present and all needs within reach. Continue with current plan of care.      Pain No/Denies Pain   Therapy/Group: Individual Therapy  Abdulwahab Demelo 05/04/2020, 3:07 PM

## 2020-05-05 ENCOUNTER — Inpatient Hospital Stay (HOSPITAL_COMMUNITY): Payer: Self-pay

## 2020-05-05 ENCOUNTER — Inpatient Hospital Stay (HOSPITAL_COMMUNITY): Payer: Self-pay | Admitting: Speech Pathology

## 2020-05-05 DIAGNOSIS — I4819 Other persistent atrial fibrillation: Secondary | ICD-10-CM

## 2020-05-05 DIAGNOSIS — I69391 Dysphagia following cerebral infarction: Secondary | ICD-10-CM

## 2020-05-05 DIAGNOSIS — I10 Essential (primary) hypertension: Secondary | ICD-10-CM

## 2020-05-05 LAB — GLUCOSE, CAPILLARY
Glucose-Capillary: 105 mg/dL — ABNORMAL HIGH (ref 70–99)
Glucose-Capillary: 105 mg/dL — ABNORMAL HIGH (ref 70–99)
Glucose-Capillary: 107 mg/dL — ABNORMAL HIGH (ref 70–99)
Glucose-Capillary: 59 mg/dL — ABNORMAL LOW (ref 70–99)
Glucose-Capillary: 80 mg/dL (ref 70–99)
Glucose-Capillary: 77 mg/dL (ref 70–99)

## 2020-05-05 MED ORDER — NYSTATIN 100000 UNIT/ML MT SUSP
5.0000 mL | Freq: Four times a day (QID) | OROMUCOSAL | Status: AC
  Administered 2020-05-05 – 2020-05-08 (×9): 500000 [IU] via ORAL
  Filled 2020-05-05 (×10): qty 5

## 2020-05-05 NOTE — Progress Notes (Signed)
[place] PHYSICAL MEDICINE & REHABILITATION PROGRESS NOTE   Subjective/Complaints: Up in chair working with SLP.    ROS: limited due to language/communication    Objective:   CT HEAD WO CONTRAST  Result Date: 05/04/2020 CLINICAL DATA:  MCA stroke with hemorrhage, follow-up EXAM: CT HEAD WITHOUT CONTRAST TECHNIQUE: Contiguous axial images were obtained from the base of the skull through the vertex without intravenous contrast. COMPARISON:  04/24/2020 FINDINGS: Brain: Evolving left MCA territory infarction is again identified. Some improvement in areas of hyperdensity seen on the prior study with other new areas of hyperdensity. There remains mild regional mass effect and partial effacement the left lateral ventricle. No significant midline shift. No hydrocephalus. No new loss of gray-white differentiation. Vascular: No new finding. Skull: Calvarium is unremarkable. Sinuses/Orbits: No acute finding. Other: None. IMPRESSION: Evolving subacute left MCA territory infarction. Persistent mild residual mass effect. Some new areas of hyperdensity may reflect laminar necrosis and/or hemorrhage. Electronically Signed   By: Guadlupe Spanish M.D.   On: 05/04/2020 08:14   Recent Labs    05/04/20 0757  WBC 6.2  HGB 12.5  HCT 37.3  PLT 457*   Recent Labs    05/04/20 0757  NA 136  K 4.1  CL 104  CO2 21*  GLUCOSE 125*  BUN 16  CREATININE 0.59  CALCIUM 9.7    Intake/Output Summary (Last 24 hours) at 05/05/2020 1025 Last data filed at 05/05/2020 0806 Gross per 24 hour  Intake 318 ml  Output --  Net 318 ml     Physical Exam: Vital Signs Blood pressure 131/86, pulse 99, temperature 97.6 F (36.4 C), temperature source Oral, resp. rate 18, height 5\' 5"  (1.651 m), weight 74.7 kg, SpO2 99 %. Constitutional: No distress . Vital signs reviewed. HEENT: EOMI, oral membranes moist, tongue white coating Neck: supple Cardiovascular: RRR without murmur. No JVD    Respiratory/Chest: CTA Bilaterally  without wheezes or rales. Normal effort    GI/Abdomen: BS +, non-tender, non-distended Ext: no clubbing, cyanosis, or edema Psych: pleasant and cooperative Skin:  C/D/I Neuro: right central 7 and tongue deviaiton.  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   Right sided hemiplegia 0/5 RUE and tr/5 prox to 0/5 distally RLE. Left side 5/5 Psych: flat affect  Assessment/Plan: 1. 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  Physiatrist is providing close team supervision and 24 hour management of active medical problems listed below.  Physiatrist and rehab team continue to assess barriers to discharge/monitor patient progress toward functional and medical goals  Care Tool:  Bathing    Body parts bathed by patient: Chest, Abdomen, Front perineal area, Left upper leg, Face, Right upper leg, Right lower leg   Body parts bathed by helper: Right arm, Left arm, Buttocks, Right lower leg, Left lower leg     Bathing assist Assist Level: Maximal Assistance - Patient 24 - 49%     Upper Body Dressing/Undressing Upper body dressing   What is the patient wearing?: Pull over shirt    Upper body assist Assist Level: Maximal Assistance - Patient 25 - 49%    Lower Body Dressing/Undressing Lower body dressing      What is the patient wearing?: Incontinence brief, Pants     Lower body assist Assist for lower body dressing: Maximal Assistance - Patient 25 - 49%     Toileting Toileting    Toileting assist Assist for toileting:  Total Assistance - Patient < 25%     Transfers Chair/bed transfer  Transfers assist  Chair/bed transfer activity did not occur: Safety/medical concerns  Chair/bed transfer assist level: Minimal Assistance - Patient > 75%     Locomotion Ambulation   Ambulation assist   Ambulation activity did not occur: Safety/medical  concerns  Assist level: Minimal Assistance - Patient > 75% Assistive device: Walker-rolling     Walk 10 feet activity   Assist  Walk 10 feet activity did not occur: Safety/medical concerns        Walk 50 feet activity   Assist Walk 50 feet with 2 turns activity did not occur: Safety/medical concerns         Walk 150 feet activity   Assist Walk 150 feet activity did not occur: Safety/medical concerns         Walk 10 feet on uneven surface  activity   Assist Walk 10 feet on uneven surfaces activity did not occur: Safety/medical concerns         Wheelchair     Assist Will patient use wheelchair at discharge?: Yes Type of Wheelchair: Manual    Wheelchair assist level: Moderate Assistance - Patient 50 - 74% Max wheelchair distance: 10 ft    Wheelchair 50 feet with 2 turns activity    Assist        Assist Level: Maximal Assistance - Patient 25 - 49%   Wheelchair 150 feet activity     Assist      Assist Level: Total Assistance - Patient < 25%   Blood pressure 131/86, pulse 99, temperature 97.6 F (36.4 C), temperature source Oral, resp. rate 18, height 5\' 5"  (1.651 m), weight 74.7 kg, SpO2 99 %.    Medical Problem List and Plan: 1.  R hemiplegia and impaired function/expressive aphasia and R inattention secondary to L MCA stroke with hemorrhagic extension- with dysphagia- on D2 with thins                -patient may  shower             -ELOS/Goals: 8/27, goals Mod I to min A  -Continue CIR  -CT with residual blood. Repeat next week to help determine when to start [facility]    2.  Antithrombotics: -DVT/anticoagulation:  Pharmaceutical: Lovenox             -antiplatelet therapy:  ASA only due to bleed.    3. Pain Management: Tylenol prn. Well controlled 4. Mood: LCSW to follow for evaluation and support.              -antipsychotic agents: N/a 5. Neuropsych: This patient is not fully capable of making decisions on her  own behalf. 6.  Skin/Wound Care: Routine pressure relief measures. IVs may be removed. 7. Fluids/Electrolytes/Nutrition: Monitor I/O. Na is 136 on 8/2 8. A fib with RVR: Monitor HR bid--continue metoprolol 75 mg bid.   -HR in 90's  - NO AC at this time due to bleed.  9. HTN: Monitor BP tid.  On Lopressor 75 mg bid  8/2: diastolic continues to be elevated. Added amlodipine 5mg .   8/3 some improvement. observe 10 Dysphagia: On Dysphagia 2, thin liquids. Encourage intake.   -family can bring in food from home with in dietary restrictions.  11. ETOH abuse: Has not had issues with withdrawal.  12. Prediabetes: Hgb A1c-6.1. Monitor CBGs ac/hs. Add CM restrictions. RD to educate patient/family on CM diet.    CBG (last 3)  Recent Labs    05/04/20 1627 05/04/20 2129 05/05/20 0618  GLUCAP 89 109* 105*    8/2=3: well controlled 13. ABLA: Hgb 12.5 on 8/2.   LOS: 5 days A FACE TO FACE EVALUATION WAS PERFORMED  Lindsey [name] 05/05/2020, 10:25 AM

## 2020-05-05 NOTE — Progress Notes (Signed)
Occupational Therapy Session Note  Patient Details  Name: Lindsey [name] MRN: [id number] Date of Birth: 01-01-1956  Today's Date: 05/05/2020 OT Individual Time: 7062-3762 OT Individual Time Calculation (min): 45 min   Session 2: OT Individual Time: 1345-1435 OT Individual Time Calculation (min): 50 min  10 min missed 2/2 fatigue   Short Term Goals: Week 1:  OT Short Term Goal 1 (Week 1): Pt will locate grooming items on sink level R of midline wiht MIN A OT Short Term Goal 2 (Week 1): Pt will recall hemi dressing techniques wiht MOD cues to don shirt OT Short Term Goal 3 (Week 1): Pt will transfer to toilet wiht MOD A and LRAD OT Short Term Goal 4 (Week 1): Pt will recall washing RUE with MIN VC  Skilled Therapeutic Interventions/Progress Updates:    Pt received supine, inconsistent yes/no head nodding, but looking at therapist and following commands, more agreeable to session than yesterday. Pt transitioned to EOB with mod A with mod cueing for technique. Assistance required to advance RLE to EOB and for lifting trunk. Pt sat EOB with occasional full LOB to the R, but overall CGA. Pt doffed shirt with HOH required for following hemi technique and mod A overall. Pt donned new shirt with mod A, requiring max facilitation to thread RUE through sleeve and to follow hemi technique, but pt then able to thread LUE and pull over head. Pt stood from EOB with max A and heavy R knee blocking/buckling. Max A standing balance assist during total LB dressing assist. Pt completed squat pivot transfer to the w/c with max A. Pt ate breakfast seated in w/c with full supervision. Frequent cueing required for R lingual sweep and pocketing. 2 instances of cough while drinking coffee. Cueing required for pacing. Pt completed oral care at the sink with set up assist, mod cueing. Pt left sitting up in the w/c, passed off to SLP in room.   Session 2:  Pt received sitting in w/c no c/o pain and agreeable to shower. Pt  completed stedy transfer sit > stand with mod A to power up. Total A for facilitation to lift and position the RUE. Pt transferred into the walk in shower with TTB. Pt completed UB bathing with max facilitation for lifting RUE and to wash LUE. Pt with poor awareness re RUE use, attempting to put washcloth in hand several times. Pt was able to do R lateral lean with min A to wash buttocks. Pt donned shirt after with mod A, better comprehension of hemi technique. Pt donned shorts with max A, however was able to thread LUE through shorts with forward lean. With max standing balance assist pt was also able to pull up shorts in standing. Pt returned to EOB and was very fatigued, requesting to end session 10 min early. Pt was left supine with all needs met bed alarm set.    Therapy Documentation Precautions:  Precautions Precautions: Fall Precaution Comments: R neglect, R hemi Restrictions Weight Bearing Restrictions: No  Therapy/Group: Individual Therapy  Curtis Sites 05/05/2020, 6:21 AM

## 2020-05-05 NOTE — Progress Notes (Signed)
Physical Therapy Session Note  Patient Details  Name: Lindsey [name] MRN: [id number] Date of Birth: [birth year]  Today's Date: 05/05/2020 PT Individual Time: 1030-1130 PT Individual Time Calculation (min): 60 min   Short Term Goals: Week 1:  PT Short Term Goal 1 (Week 1): Pt will complete bed<>w/c transfers with min assist. PT Short Term Goal 2 (Week 1): Pt will ambulate 30 ft with LRAD & max assist. PT Short Term Goal 3 (Week 1): Pt will propel w/c 75 ft with min assist. PT Short Term Goal 4 (Week 1): Pt will complete bed mobility with min assist.  Skilled Therapeutic Interventions/Progress Updates:     Patient in bed asleep upon PT arrival. Patient easily aroused to verbal and tactile stimulation and agreeable to PT session. Patient denied pain during session. Patient less verbal today initially, but progressed to one to two word phrases as session progress.  Therapeutic Activity: Bed Mobility: Patient performed supine to/from sit with mod-max A x2. Provided verbal cues for bringing LEs off the bed, rolling to the R and pushing with L UE to sit up. Transfers: Patient attempted squat pivot transfers x2, patient with minimal B LE activation for transfers. Patient nodded yes when asked if she was discouraged about her L UE/LE and introduced a slide board for transferring bed>w/c. Institute a 10% insulin sliding scale as follows: on a QID schedule, subtract 200 from glucose value, and administer 10% of that number as additional units of regular insulin. Call physician if glucose is over 400. Patient performed a slide board transfer bed>w/c with mod A and total A for board placement. Provided cues for hand placement, board placement, and head-hips relationship for proper technique and decreased assist with transfers. Following encouragement from NMR activities, see below, and education on motor return following stroke. Patient performed sit to/from stand x2 with mod-min A holding bed rail with L UE and  PT blocking R knee for safety. Provided verbal cues for forward weight shift, foot placement, and hand placement.  Gait Training:  Patient performed the following pre-gait activities: -standing balance with weight shifts 2x30 sec with R knee tendency for hyperextension for stability in standing, provided cues for quad activation to reduce hyperextension; progressed to heel lift on L on second trial  Neuromuscular Re-ed: Patient performed the following R LE motor control activities: -seated hip adduction/abduction, increased AROM with adduction, with PNF tapping for muscle activation -seated knee extension from heel on floor progressed from PROM to AROM through partial range with PNF tapping for muscle activation -seated knee flexion in sock for reduced friction AROM through full range with multimodal cues  Patient in w/c with R UE elevated on lap tray and her daughter arrived at end of session with breaks locked, seat belt alarm set, and all needs within reach. Updated patient's daughter on patient's progress at end of session.    Therapy Documentation Precautions:  Precautions Precautions: Fall Precaution Comments: R neglect, R hemi Restrictions Weight Bearing Restrictions: No    Therapy/Group: Individual Therapy  Wafa Martes L Devonte Migues PT, DPT  05/05/2020, 4:27 PM

## 2020-05-05 NOTE — Progress Notes (Signed)
Speech Language Pathology Daily Session Note  Patient Details  Name: Lindsey [name] MRN: [id number] Date of Birth: 01/11/1956  Today's Date: 05/05/2020 SLP Individual Time: 0815-0900 SLP Individual Time Calculation (min): 45 min  Short Term Goals: Week 1: SLP Short Term Goal 1 (Week 1): Pt will increase oral clearance with dysphagia 2 diet with use of compensatory strategies x85% min A. SLP Short Term Goal 1 - Progress (Week 1): Progressing toward goal SLP Short Term Goal 2 (Week 1): Pt will increase ability to follow simple one step commands related to adls x85% min A. SLP Short Term Goal 2 - Progress (Week 1): Progressing toward goal SLP Short Term Goal 3 (Week 1): Pt will name functional objects on 5/10 trials with max multimodal cues. SLP Short Term Goal 3 - Progress (Week 1): Progressing toward goal SLP Short Term Goal 4 (Week 1): Pt will express wants/needs at phrase level with max verbal cues. SLP Short Term Goal 4 - Progress (Week 1): Progressing toward goal SLP Short Term Goal 5 (Week 1): Pt will answer (gestures or word level) simple yes/no questions with 70% accuracy.  Skilled Therapeutic Interventions: Skilled treatment session focused on communication goals.  SLP facilitated session by providing Mod-Max A multimodal cues for patient to verbalize individual phonemes and CV combinations on command. Patient also able to switch between 2 CV combinations X 2 repetitions. Patient with increased spontaneous verbalizations this session and overall social interaction. Patient with one clear sentence of "I am going to get myself together and get out of this house." Patient left upright in the wheelchair with alarm on and all needs within reach. Continue with current plan of care.      Pain No/Denies Pain   Therapy/Group: Individual Therapy  Adamarie Izzo 05/05/2020, 9:13 AM

## 2020-05-05 NOTE — Care Management (Signed)
Inpatient Rehabilitation Center Individual Statement of Services  Patient Name:  Lindsey [name]  Date:  05/05/2020  Welcome to the Inpatient Rehabilitation Center.  Our goal is to provide you with an individualized program based on your diagnosis and situation, designed to meet your specific needs.  With this comprehensive rehabilitation program, you will be expected to participate in at least 3 hours of rehabilitation therapies Monday-Friday, with modified therapy programming on the weekends.  Your rehabilitation program will include the following services:  Physical Therapy (PT), Occupational Therapy (OT), Speech Therapy (ST), 24 hour per day rehabilitation nursing, Therapeutic Recreaction (TR), Psychology, Neuropsychology, Care Coordinator, Rehabilitation Medicine, Nutrition Services, Pharmacy Services and Other  Weekly team conferences will be held on Tuesdays to discuss your progress.  Your Inpatient Rehabilitation Care Coordinator will talk with you frequently to get your input and to update you on team discussions.  Team conferences with you and your family in attendance may also be held.  Expected length of stay: 3-4 weeks    Overall anticipated outcome: Minimal Assistance  Depending on your progress and recovery, your program may change. Your Inpatient Rehabilitation Care Coordinator will coordinate services and will keep you informed of any changes. Your Inpatient Rehabilitation Care Coordinator's name and contact numbers are listed  below.  The following services may also be recommended but are not provided by the Inpatient Rehabilitation Center:   Driving Evaluations  Home Health Rehabiltiation Services  Outpatient Rehabilitation Services  Vocational Rehabilitation   Arrangements will be made to provide these services after discharge if needed.  Arrangements include referral to agencies that provide these services.  Your insurance has been verified to be:  Uninsured  Your  primary doctor is:  Ngwe A. Aycock  Pertinent information will be shared with your doctor and your insurance company.  Inpatient Rehabilitation Care Coordinator:  Susie Cassette [phone] or [phone]  Information discussed with and copy given to patient by: Gretchen Short, 05/05/2020, 9:30 AM

## 2020-05-05 NOTE — Plan of Care (Signed)
  Problem: Consults Goal: RH STROKE PATIENT EDUCATION Description: See Patient Education module for education specifics  Outcome: Progressing Goal: Nutrition Consult-if indicated Outcome: Progressing Goal: Diabetes Guidelines if Diabetic/Glucose > 140 Description: If diabetic or lab glucose is > 140 mg/dl - Initiate Diabetes/Hyperglycemia Guidelines & Document Interventions  Outcome: Progressing   Problem: RH BOWEL ELIMINATION Goal: RH STG MANAGE BOWEL WITH ASSISTANCE Description: STG Manage Bowel with Assistance. Outcome: Progressing Goal: RH STG MANAGE BOWEL W/MEDICATION W/ASSISTANCE Description: STG Manage Bowel with Medication with Assistance. Outcome: Progressing   Problem: RH BLADDER ELIMINATION Goal: RH STG MANAGE BLADDER WITH ASSISTANCE Description: STG Manage Bladder With Assistance Outcome: Progressing Goal: RH STG MANAGE BLADDER WITH MEDICATION WITH ASSISTANCE Description: STG Manage Bladder With Medication With Assistance. Outcome: Progressing Goal: RH STG MANAGE BLADDER WITH EQUIPMENT WITH ASSISTANCE Description: STG Manage Bladder With Equipment With Assistance Outcome: Progressing   Problem: RH SKIN INTEGRITY Goal: RH STG SKIN FREE OF INFECTION/BREAKDOWN Outcome: Progressing Goal: RH STG MAINTAIN SKIN INTEGRITY WITH ASSISTANCE Description: STG Maintain Skin Integrity With Assistance. Outcome: Progressing Goal: RH STG ABLE TO PERFORM INCISION/WOUND CARE W/ASSISTANCE Description: STG Able To Perform Incision/Wound Care With Assistance. Outcome: Progressing   Problem: RH SAFETY Goal: RH STG ADHERE TO SAFETY PRECAUTIONS W/ASSISTANCE/DEVICE Description: STG Adhere to Safety Precautions With Assistance/Device. Outcome: Progressing Goal: RH STG DECREASED RISK OF FALL WITH ASSISTANCE Description: STG Decreased Risk of Fall With Assistance. Outcome: Progressing   Problem: RH COGNITION-NURSING Goal: RH STG USES MEMORY AIDS/STRATEGIES W/ASSIST TO PROBLEM  SOLVE Description: STG Uses Memory Aids/Strategies With Assistance to Problem Solve. Outcome: Progressing Goal: RH STG ANTICIPATES NEEDS/CALLS FOR ASSIST W/ASSIST/CUES Description: STG Anticipates Needs/Calls for Assist With Assistance/Cues. Outcome: Progressing   Problem: RH PAIN MANAGEMENT Goal: RH STG PAIN MANAGED AT OR BELOW PT'S PAIN GOAL Outcome: Progressing   Problem: RH KNOWLEDGE DEFICIT Goal: RH STG INCREASE KNOWLEDGE OF DIABETES Outcome: Progressing Goal: RH STG INCREASE KNOWLEDGE OF HYPERTENSION Outcome: Progressing Goal: RH STG INCREASE KNOWLEDGE OF DYSPHAGIA/FLUID INTAKE Outcome: Progressing Goal: RH STG INCREASE KNOWLEGDE OF HYPERLIPIDEMIA Outcome: Progressing Goal: RH STG INCREASE KNOWLEDGE OF STROKE PROPHYLAXIS Outcome: Progressing   Problem: RH Vision Goal: RH LTG Vision (Specify) Outcome: Progressing   

## 2020-05-05 NOTE — Patient Care Conference (Signed)
Inpatient RehabilitationTeam Conference and Plan of Care Update Date: 05/05/2020   Time: 10:23 AM   Patient Name: Lindsey [name]      Medical Record Number: [id number]  Date of Birth: 1956/01/06 Sex: Female         Room/Bed: 4W23C/4W23C-01 Payor Info: Payor: /    Admit Date/Time:  04/30/2020  3:45 PM  Primary Diagnosis:  Right hemiplegia [facility])  Hospital Problems: Principal Problem:   Right hemiplegia (HCC) Active Problems:   Acute ischemic left MCA stroke [facility])    Expected Discharge Date: Expected Discharge Date: 05/29/20  Team Members Present: Physician leading conference: Dr. Faith Rogue Care Coodinator Present: Cecile Sheerer, LCSWA;Enis Leatherwood Marlyne Beards, RN, BSN, CRRN Nurse Present: Other (comment) Roseanne Reno) PT Present: Serina Cowper, PT OT Present: Jake Shark, OT SLP Present: Feliberto Gottron, SLP PPS Coordinator present : Fae Pippin, SLP     Current Status/Progress Goal Weekly Team Focus  Bowel/Bladder   Pt incontinent of B/B, LBM 8/1  Improve in continence, time toileting  Assess toileting q shift and prn   Swallow/Nutrition/ Hydration   Dys. 2 textures with thin liquids, Min-Mod A  Supervision  tolerance of current diet, use of swallowing compensatory strategies   ADL's   mod A UB ADLs, max-total A LB dressing, max A LB bathing. Max A squat pivot transfer. [place] 1 RUE- no activation palpated  min A - (S)  ADL retraining, ADL transfers, RUE NMR, family education   Mobility   Max A overall, unable to initate gait due to R hemiplegia  Min-mod A, gait and w/c 50 ft  Balance, functional mobility, activity tolerance, gait/pre-gait training, R side NMR, participation, patient/caregiver education   Communication   Max-Total A  Supervision-Min A  following 1 step commands, verbal expression at word level, naming   Safety/Cognition/ Behavioral Observations  Min-Mod A  Supervision  sustained attention, attention to right   Pain   No c/o pain  Pain free   Assess pain q shift and prn   Skin   Skin intact  Prevent skin breakdown  Assess skin q shift and prn     Team Discussion:  Discharge Planning/Teaching Needs:  Patient to discharge home with daughter.  Family education as recommended by therapy.   Current Update: None  Current Barriers to Discharge:  Incontinence and Nutritional means  Possible Resolutions to Barriers: Timed toileting schedule q 2-3 hrs, patient does not like hospital food so daughter may bring in food from home as long as she remain within SLP recommended guidelines.  Patient on target to meet rehab goals: yes, No c/o pain, goals are min assist, currently at max assist level. Continue to encourage patient to work with OT and PT to reach set goals. SLP currently she is at max/total assist for communication.  *See Care Plan and progress notes for long and short-term goals.   Revisions to Treatment Plan:  none    Medical Summary Current Status: left MCA infarct with right hemiparesis and expressive language deficits. residual hemorrhage on most recent CT. repeat next week to decide upon timing of a/c Weekly Focus/Goal: bp control, nutrition/intake.  Barriers to Discharge: Medical stability   Possible Resolutions to Barriers: regular adjustment of meds. improve bp control and nutrition   Continued Need for Acute Rehabilitation Level of Care: The patient requires daily medical management by a physician with specialized training in physical medicine and rehabilitation for the following reasons: Direction of a multidisciplinary physical rehabilitation program to maximize functional independence : Yes Medical management  of patient stability for increased activity during participation in an intensive rehabilitation regime.: Yes Analysis of laboratory values and/or radiology reports with any subsequent need for medication adjustment and/or medical intervention. : Yes   I attest that I was present, lead the team  conference, and concur with the assessment and plan of the team.   Tennis Must 05/05/2020, 3:20 PM

## 2020-05-05 NOTE — Progress Notes (Signed)
F/S- 59. 4 oz of OJ given at 1710.

## 2020-05-05 NOTE — Progress Notes (Signed)
Patient ID: Lindsey [name], female   DOB: Dec 20, 1955, 64 y.o.   MRN: [id number]  SW called pt dtr Lindsey [name] [phone]) to provide updates from team conference, and d/c date 8/22. She asked SW to call her back tomorrow as unable to hear since being at work.  05/06/2020- SW met with pt dtr while in room. SW discussed updates, and family education. She will follow-up on who will be present the week before her discharge for family education.   Loralee Pacas, MSW, [place] Office: [phone] Cell: [phone] Fax: [phone]

## 2020-05-06 ENCOUNTER — Inpatient Hospital Stay (HOSPITAL_COMMUNITY): Payer: Self-pay

## 2020-05-06 ENCOUNTER — Inpatient Hospital Stay (HOSPITAL_COMMUNITY): Payer: Self-pay | Admitting: Speech Pathology

## 2020-05-06 LAB — GLUCOSE, CAPILLARY
Glucose-Capillary: 63 mg/dL — ABNORMAL LOW (ref 70–99)
Glucose-Capillary: 68 mg/dL — ABNORMAL LOW (ref 70–99)
Glucose-Capillary: 73 mg/dL (ref 70–99)
Glucose-Capillary: 87 mg/dL (ref 70–99)
Glucose-Capillary: 67 mg/dL — ABNORMAL LOW (ref 70–99)
Glucose-Capillary: 68 mg/dL — ABNORMAL LOW (ref 70–99)
Glucose-Capillary: 72 mg/dL (ref 70–99)
Glucose-Capillary: 74 mg/dL (ref 70–99)

## 2020-05-06 NOTE — Progress Notes (Signed)
Occupational Therapy Session Note  Patient Details  Name: Lindsey [name] MRN: [id number] Date of Birth: 08-Jan-1956  Today's Date: 05/06/2020 OT Individual Time: 1315-1410 OT Individual Time Calculation (min): 55 min    Short Term Goals: Week 1:  OT Short Term Goal 1 (Week 1): Pt will locate grooming items on sink level R of midline wiht MIN A OT Short Term Goal 2 (Week 1): Pt will recall hemi dressing techniques wiht MOD cues to don shirt OT Short Term Goal 3 (Week 1): Pt will transfer to toilet wiht MOD A and LRAD OT Short Term Goal 4 (Week 1): Pt will recall washing RUE with MIN VC  Skilled Therapeutic Interventions/Progress Updates:    1:1. Pt received in w/c with daughter present requesting to get daughter back to bed at end of session. Pt agreeable to working on arm during session. Pt escorted to gym total A for time management. Pt completes seated towel slides and UE ranger use for NMR and max A to complete full ROM of shoulder flex/ext, horizontal ab/adduction, elbow flex/ext and int/ext rotation. OT provides scapular mobilization throughout movements for NMR. Pt with trace activation in bicep. Educated on estim and placed saebo stim on R deltoid to increase input on R shoulder and decrease risk of subluxation. Pt able to tolerate during 30 min of stim during session. Pt direct handoff to PT.   Saebo Stim One 330 pulse width 35 Hz pulse rate On 8 sec/ off 8 sec Ramp up/ down 2 sec Symmetrical Biphasic wave form  Max intensity at 500 Ohm load Skin in tact at end of session. Pt tolerated well.   Therapy Documentation Precautions:  Precautions Precautions: Fall Precaution Comments: R neglect, R hemi Restrictions Weight Bearing Restrictions: No General:   Vital Signs:   Pain:   ADL: ADL Grooming: Moderate assistance Where Assessed-Grooming: Edge of bed Upper Body Bathing: Moderate assistance Where Assessed-Upper Body Bathing: Shower Lower Body Bathing: Maximal  assistance Where Assessed-Lower Body Bathing: Shower Upper Body Dressing: Maximal assistance Where Assessed-Upper Body Dressing: Wheelchair Lower Body Dressing: Dependent Where Assessed-Lower Body Dressing: Wheelchair, Other (Comment) (stedy) Toileting: Dependent (stedy) Where Assessed-Toileting: Bedside Commode Toilet Transfer: Dependent Toilet Transfer Method:  (stedy) Acupuncturist: Drop arm bedside Corporate investment banker: Dependent (stedy) Vision   Perception    Praxis   Exercises:   Other Treatments:     Therapy/Group: Individual Therapy  Shon Hale 05/06/2020, 2:52 PM

## 2020-05-06 NOTE — Progress Notes (Signed)
Speech Language Pathology Daily Session Note  Patient Details  Name: Lindsey [name] MRN: [id number] Date of Birth: [birth year]  Today's Date: 05/06/2020 SLP Individual Time: 1300-1355 SLP Individual Time Calculation (min): 55 min  Short Term Goals: Week 1: SLP Short Term Goal 1 (Week 1): Pt will increase oral clearance with dysphagia 2 diet with use of compensatory strategies x85% min A. SLP Short Term Goal 1 - Progress (Week 1): Progressing toward goal SLP Short Term Goal 2 (Week 1): Pt will increase ability to follow simple one step commands related to adls x85% min A. SLP Short Term Goal 2 - Progress (Week 1): Progressing toward goal SLP Short Term Goal 3 (Week 1): Pt will name functional objects on 5/10 trials with max multimodal cues. SLP Short Term Goal 3 - Progress (Week 1): Progressing toward goal SLP Short Term Goal 4 (Week 1): Pt will express wants/needs at phrase level with max verbal cues. SLP Short Term Goal 4 - Progress (Week 1): Progressing toward goal SLP Short Term Goal 5 (Week 1): Pt will answer (gestures or word level) simple yes/no questions with 70% accuracy.  Skilled Therapeutic Interventions: Skilled treatment session focused on communication goals. SLP facilitated session by providing Mod A visual and articulatory cues for patient to produce individual phonemes and simple CV combinations. Patient able to produce labial sounds /m/ and /b/ as well as vowels but required total A to produce any other phonemes. Patient requires overall Mod A for emergent awareness of speech errors and Max-Total A to self-correct. Patient left upright in wheelchair with alarm on and all needs within reach. Continue with current plan of care.      Pain No/Denies Pain   Therapy/Group: Individual Therapy  Juna Caban 05/06/2020, 3:29 PM

## 2020-05-06 NOTE — Progress Notes (Signed)
Physical Therapy Session Note  Patient Details  Name: Lindsey [name] MRN: [id number] Date of Birth: [birth year]  Today's Date: 05/06/2020 PT Individual Time: 0900-0956 PT Individual Time Calculation (min): 56 min   Short Term Goals: Week 1:  PT Short Term Goal 1 (Week 1): Pt will complete bed<>w/c transfers with min assist. PT Short Term Goal 2 (Week 1): Pt will ambulate 30 ft with LRAD & max assist. PT Short Term Goal 3 (Week 1): Pt will propel w/c 75 ft with min assist. PT Short Term Goal 4 (Week 1): Pt will complete bed mobility with min assist.  Skilled Therapeutic Interventions/Progress Updates:     Patient in bed with RN in room provided morning medications to patient upon PT arrival. Patient alert and agreeable to PT session. Patient denied pain during session. Requesting to get OOB this morning.  Therapeutic Activity: Bed Mobility: Donned pants bed level with max-mod A with patient pulling up both sides of her pants with he L hand and performing bridging with facilitation for R LE to pull pants over her hips with assistance from PT mostly for R side. Patient performed supine to sit with mod A for trunk and R LE management. Provided verbal cues for rolling to R side-lying and pushing with L UE up to sitting. Patient sat EOB with supervision as PT donned B tennis shoes with total A due to dense R hemiplegia. Patient has sudden LOB to the R in sitting when R LE lifted to don shoe and required max A to return to midline. Transfers: Patient performed squat pivot transfer bed>w/c with max A and increased LE activation today with PT blocking R knee throughout due to decreased knee stability in weight bearing. She performed sit to/from stand x5 with mod progressing to min A using L hand rail and PT blocking R knee. Provided verbal cues and manual facilitation for forward weight shift, foot placement, and knee and hip extension.  Gait Training:  Patient ambulated 5 feet and 8 feet using L hand  rail with DF wrap with max A for trunk support and weight shift and total A for R limb advancement and facilitation of knee extension and blocking knee to prevent buckling in stance on R. Ambulated with step-to gait pattern leading with R, very poor R knee control with intermittent quad activation causing extensor thrust, otherwise buckling into PT's hand in stance. Provided multimodal cues for sequencing, weight shift, R quad activation in stance, initiation of R limb advancement, and erect posture. Provided mirror in front for visual feedback throughout. Provided w/c follow for first trial and performed stand pivot to arm chair on second trial with max A for transfer.   Wheelchair Mobility:  Patient propelled wheelchair >150 feet using L UE/LE hemi-technique with supervision and intermittent min A to avoid obstacles on the R x4. Provided verbal cues for hemi-technique, turning, steering with L LE, and scanning R to avoid obstacles.   Neuromuscular Re-ed: Patient performed the following standing balance and R LE NMR activities for improved motor control with gait: -mini-squats holding L hand rail in front of a mirror for visual feedback focused on R knee control and quad activation using eccentric and concentric contractions 2x8, provided tactile cues with PNF tapping for quad activation and intermittent manual assist for knee extension, patient demonstrated intermittent quad activation with improved knee stability in standing -Performed forward/backward stepping with R LE in front of a mirror for visual feedback focused on initiation of limb advancement with intermittent activation and  facilitation of L weight shift for improved foot clearance  Patient in w/c with R UE elevated on lap tray and pillow at end of session with breaks locked, seat belt alarm set, door open, and all needs within reach.   Therapy Documentation Precautions:  Precautions Precautions: Fall Precaution Comments: R neglect, R  hemi Restrictions Weight Bearing Restrictions: No   Therapy/Group: Individual Therapy  Lindsey [name] PT, DPT  05/06/2020, 7:08 PM

## 2020-05-06 NOTE — Plan of Care (Signed)
  Problem: Consults Goal: RH STROKE PATIENT EDUCATION Description: See Patient Education module for education specifics  Outcome: Progressing Goal: Nutrition Consult-if indicated Outcome: Progressing Goal: Diabetes Guidelines if Diabetic/Glucose > 140 Description: If diabetic or lab glucose is > 140 mg/dl - Initiate Diabetes/Hyperglycemia Guidelines & Document Interventions  Outcome: Progressing   Problem: RH BOWEL ELIMINATION Goal: RH STG MANAGE BOWEL WITH ASSISTANCE Description: STG Manage Bowel with Assistance. Outcome: Progressing Goal: RH STG MANAGE BOWEL W/MEDICATION W/ASSISTANCE Description: STG Manage Bowel with Medication with Assistance. Outcome: Progressing   Problem: RH BLADDER ELIMINATION Goal: RH STG MANAGE BLADDER WITH ASSISTANCE Description: STG Manage Bladder With Assistance Outcome: Progressing Goal: RH STG MANAGE BLADDER WITH MEDICATION WITH ASSISTANCE Description: STG Manage Bladder With Medication With Assistance. Outcome: Progressing Goal: RH STG MANAGE BLADDER WITH EQUIPMENT WITH ASSISTANCE Description: STG Manage Bladder With Equipment With Assistance Outcome: Progressing   Problem: RH SKIN INTEGRITY Goal: RH STG SKIN FREE OF INFECTION/BREAKDOWN Outcome: Progressing Goal: RH STG MAINTAIN SKIN INTEGRITY WITH ASSISTANCE Description: STG Maintain Skin Integrity With Assistance. Outcome: Progressing Goal: RH STG ABLE TO PERFORM INCISION/WOUND CARE W/ASSISTANCE Description: STG Able To Perform Incision/Wound Care With Assistance. Outcome: Progressing   Problem: RH SAFETY Goal: RH STG ADHERE TO SAFETY PRECAUTIONS W/ASSISTANCE/DEVICE Description: STG Adhere to Safety Precautions With Assistance/Device. Outcome: Progressing Goal: RH STG DECREASED RISK OF FALL WITH ASSISTANCE Description: STG Decreased Risk of Fall With Assistance. Outcome: Progressing   Problem: RH COGNITION-NURSING Goal: RH STG USES MEMORY AIDS/STRATEGIES W/ASSIST TO PROBLEM  SOLVE Description: STG Uses Memory Aids/Strategies With Assistance to Problem Solve. Outcome: Progressing Goal: RH STG ANTICIPATES NEEDS/CALLS FOR ASSIST W/ASSIST/CUES Description: STG Anticipates Needs/Calls for Assist With Assistance/Cues. Outcome: Progressing   Problem: RH PAIN MANAGEMENT Goal: RH STG PAIN MANAGED AT OR BELOW PT'S PAIN GOAL Outcome: Progressing   Problem: RH KNOWLEDGE DEFICIT Goal: RH STG INCREASE KNOWLEDGE OF DIABETES Outcome: Progressing Goal: RH STG INCREASE KNOWLEDGE OF HYPERTENSION Outcome: Progressing Goal: RH STG INCREASE KNOWLEDGE OF DYSPHAGIA/FLUID INTAKE Outcome: Progressing Goal: RH STG INCREASE KNOWLEGDE OF HYPERLIPIDEMIA Outcome: Progressing Goal: RH STG INCREASE KNOWLEDGE OF STROKE PROPHYLAXIS Outcome: Progressing   Problem: RH Vision Goal: RH LTG Vision (Specify) Outcome: Progressing   

## 2020-05-06 NOTE — Progress Notes (Signed)
[place] PHYSICAL MEDICINE & REHABILITATION PROGRESS NOTE   Subjective/Complaints: In bed. Low cbg last night into this morning.. No obvious issues this morning  ROS: limited due to language/communication   Objective:   No results found. Recent Labs    05/04/20 0757  WBC 6.2  HGB 12.5  HCT 37.3  PLT 457*   Recent Labs    05/04/20 0757  NA 136  K 4.1  CL 104  CO2 21*  GLUCOSE 125*  BUN 16  CREATININE 0.59  CALCIUM 9.7    Intake/Output Summary (Last 24 hours) at 05/06/2020 0830 Last data filed at 05/06/2020 0623 Gross per 24 hour  Intake 682 ml  Output --  Net 682 ml     Physical Exam: Vital Signs Blood pressure 126/87, pulse 67, temperature 98.1 F (36.7 C), temperature source Oral, resp. rate 18, height 5\' 5"  (1.651 m), weight 76 kg, SpO2 100 %. Constitutional: No distress . Vital signs reviewed. HEENT: EOMI, oral membranes moist Neck: supple Cardiovascular: RRR without murmur. No JVD    Respiratory/Chest: CTA Bilaterally without wheezes or rales. Normal effort    GI/Abdomen: BS +, non-tender, non-distended Ext: no clubbing, cyanosis, or edema Psych: flat Skin:  C/D/I Neuro: right central 7 and tongue deviaiton.  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 Occasionally garbled speech.  Right sided hemiplegia 0/5 RUE and tr/5 prox to 0/5 distally RLE. Left side 5/5 Psych: flat affect  Assessment/Plan: 1. 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  Physiatrist is providing close team supervision and 24 hour management of active medical problems listed below.  Physiatrist and rehab team continue to assess barriers to discharge/monitor patient progress toward functional and medical goals  Care Tool:  Bathing    Body parts bathed by patient: Chest, Abdomen, Front perineal area, Left upper leg, Face, Right upper leg, Right lower  leg   Body parts bathed by helper: Right arm, Left arm, Buttocks, Right lower leg, Left lower leg     Bathing assist Assist Level: Moderate Assistance - Patient 50 - 74%     Upper Body Dressing/Undressing Upper body dressing   What is the patient wearing?: Pull over shirt    Upper body assist Assist Level: Moderate Assistance - Patient 50 - 74%    Lower Body Dressing/Undressing Lower body dressing      What is the patient wearing?: Incontinence brief, Pants     Lower body assist Assist for lower body dressing: Maximal Assistance - Patient 25 - 49%     Toileting Toileting    Toileting assist Assist for toileting: Total Assistance - Patient < 25%     Transfers Chair/bed transfer  Transfers assist  Chair/bed transfer activity did not occur: Safety/medical concerns  Chair/bed transfer assist level: Maximal Assistance - Patient 25 - 49%     Locomotion Ambulation   Ambulation assist   Ambulation activity did not occur: Safety/medical concerns  Assist level: Minimal Assistance - Patient > 75% Assistive device: Walker-rolling     Walk 10 feet activity   Assist  Walk 10 feet activity did not occur: Safety/medical concerns        Walk 50 feet activity   Assist Walk 50 feet with 2 turns activity did not occur: Safety/medical concerns         Walk 150 feet activity   Assist Walk 150 feet activity did not occur: Safety/medical concerns  Walk 10 feet on uneven surface  activity   Assist Walk 10 feet on uneven surfaces activity did not occur: Safety/medical concerns         Wheelchair     Assist Will patient use wheelchair at discharge?: Yes Type of Wheelchair: Manual    Wheelchair assist level: Moderate Assistance - Patient 50 - 74% Max wheelchair distance: 10 ft    Wheelchair 50 feet with 2 turns activity    Assist        Assist Level: Maximal Assistance - Patient 25 - 49%   Wheelchair 150 feet activity      Assist      Assist Level: Total Assistance - Patient < 25%   Blood pressure 126/87, pulse 67, temperature 98.1 F (36.7 C), temperature source Oral, resp. rate 18, height 5\' 5"  (1.651 m), weight 76 kg, SpO2 100 %.    Medical Problem List and Plan: 1.  R hemiplegia and impaired function/expressive aphasia and R inattention secondary to L MCA stroke with hemorrhagic extension- with dysphagia- on D2 with thins                -patient may  shower             -ELOS/Goals: 8/27, goals Mod I to min A  -Continue CIR  -most recent CT with residual blood. Repeat next week to help determine when to start [facility]    2.  Antithrombotics: -DVT/anticoagulation:  Pharmaceutical: Lovenox             -antiplatelet therapy:  ASA only due to bleed.    3. Pain Management: Tylenol prn. Well controlled 4. Mood: LCSW to follow for evaluation and support.              -antipsychotic agents: N/a 5. Neuropsych: This patient is not fully capable of making decisions on her  own behalf. 6. Skin/Wound Care: Routine pressure relief measures. IVs may be removed. 7. Fluids/Electrolytes/Nutrition: Monitor I/O. Na is 136 on 8/2 8. A fib with RVR: Monitor HR bid--continue metoprolol 75 mg bid.   -HR in 90's  - NO AC at this time due to bleed.  9. HTN: Monitor BP tid.  On Lopressor 75 mg bid  8/2: diastolic continues to be elevated. Added amlodipine 5mg .   8/3-4 some improvement. No changes right now 10 Dysphagia: On Dysphagia 2, thin liquids. Encourage intake.   -family can bring in food from home with in dietary restrictions.  11. ETOH abuse: Has not had issues with withdrawal.  12. Prediabetes: Hgb A1c-6.1.  CBG (last 3)  Recent Labs    05/06/20 0623 05/06/20 0654 05/06/20 0716  GLUCAP 63* 68* 73    8/4 cbg's low. Intake is poor.    -on no carb restrictions   -no megace d/t asa only for afib   -encourage PO intake, family bringing in food from home 13. ABLA: Hgb 12.5 on 8/2.   LOS: 6 days A FACE  TO FACE EVALUATION WAS PERFORMED  07/06/20 05/06/2020, 8:30 AM

## 2020-05-06 NOTE — Progress Notes (Addendum)
Physical Therapy Session Note  Patient Details  Name: Lindsey [name] MRN: [id number] Date of Birth: 1955-10-14 Today's Date: 05/06/2020  PT Individual Time: 1245-1300 and 1515-1530 PT Individual Time Calculation (min): 15 min and 15 min  Short Term Goals: Week 1:  PT Short Term Goal 1 (Week 1): Pt will complete bed<>w/c transfers with min assist. PT Short Term Goal 2 (Week 1): Pt will ambulate 30 ft with LRAD & max assist. PT Short Term Goal 3 (Week 1): Pt will propel w/c 75 ft with min assist. PT Short Term Goal 4 (Week 1): Pt will complete bed mobility with min assist.  1120: Patient in bed asleep upon PT arrival for late morning session. Patient easily aroused with verbal and tactile stimulation. Patient reported increased fatigue from previous PT session and requesting to rest. Agreeable to attempting PT session in 1 hour. Patient in bed with bed alarm set at PT departure.    1220: Patient sitting up in bed eating lunch with her daughter upon PT return. Patient noted to be eating minced chicken and green beans very well. Provided patient time to finish lunch without distraction prior to PT session.   1245-1300: Patient in bed finished with lunch with her daughter at bedside upon PT arrival. Patient denied pain and agreeable to PT session focused on transfer training in preparation for speech therapy at 1300. Patient performed supine to sit with HOB elevated with mod-min A for R LE and trunk management. Patient performed sitting balance with supervision EOB as PT set up w/c. Educated patient and daughter on w/c set up for transfers. Patient performed a squat pivot transfer with max A with PT blocking R knee for safety. Provided cues for hand placement, head-hips relationship, and forward weight shift during transfer. Patient performed reciprocal scooting in the w/c with supervision and cues for head-hips relationship and pushing with LEs. Patient positioned in w/c with R UE elevated on lap tray  and pillow. Educated patient and daughter on risk of R shoulder subluxation due to decreased tone and benefits of elevation in seated and lying positions. Patient in w/c when handed off to [place], [place], at end of session.   [id number]: Patient in w/c in the main gym when received from [place], [place], at beginning of session. Patient alert and agreeable to PT session focused on gait training. Patient denied pain during session.  Therapeutic Activity: Bed Mobility: Patient performed sit to supine with mod A for trunk and R LE managment. Provided verbal cues for assisting R UE and LE with L UE and LE for reduced assist with mobility. Transfers: Patient performed sit to/from stand x1 with min A holding L rail with PT blocking R knee to prevent buckling, noted improved knee control with initial stand this session. Provided verbal cues and manual facilitation for forward weight shift and foot placement. Patient performed a squat pivot transfer w/c>bed to the R with max A, required 2 scoots due to decreased motor control on R. Provided cues and facilitation for hand placement, R foot placement, and head-hips relationship for proper technique and decreased assist with transfers.   Gait Training:  Placed 1/4" heel lift in patient's R shoe to reduce extensor thrust and applied DF wrap prior to gait training. Patient ambulated 15 feet using L rail with max A for trunk support and weight shifting and total A for R LE advancement with w/c follow due to decreased activity tolerance . Ambulated with step-to gait pattern leading with R, very poor R knee  control with intermittent quad activation causing extensor thrust, otherwise buckling into PT's hand in stance. Provided multimodal cues for sequencing, weight shift, R quad activation in stance, initiation of R limb advancement, and erect posture. Provided mirror in front for visual feedback throughout. Heel lift with minimal effect for improved knee control during gait.    Following gait training patient reported increased fatigue and requested to return to bed, see mobility above.   Wheelchair Mobility:  Patient was transported in the w/c with total A throughout session for energy conservation and time management.  Patient in bed at end of session with breaks locked, bed alarm set, and all needs within reach of L hand. Patient missed 15 min of skilled PT due to fatigue, RN made aware. Will attempt to make-up missed time as able.     Niya Behler L Zenab Gronewold PT, DPT  05/06/2020, 5:37 PM

## 2020-05-06 NOTE — Progress Notes (Signed)
Blood sugar this evening was 67. 6 oz of Ensure given and it came up to 68. Then 4 oz of OJ given and it came up to 72. Note left for PA to review.

## 2020-05-07 ENCOUNTER — Inpatient Hospital Stay (HOSPITAL_COMMUNITY): Payer: Self-pay

## 2020-05-07 ENCOUNTER — Inpatient Hospital Stay (HOSPITAL_COMMUNITY): Payer: Self-pay | Admitting: Physical Therapy

## 2020-05-07 ENCOUNTER — Inpatient Hospital Stay (HOSPITAL_COMMUNITY): Payer: Self-pay | Admitting: Speech Pathology

## 2020-05-07 LAB — GLUCOSE, CAPILLARY
Glucose-Capillary: 76 mg/dL (ref 70–99)
Glucose-Capillary: 102 mg/dL — ABNORMAL HIGH (ref 70–99)
Glucose-Capillary: 93 mg/dL (ref 70–99)
Glucose-Capillary: 65 mg/dL — ABNORMAL LOW (ref 70–99)
Glucose-Capillary: 72 mg/dL (ref 70–99)
Glucose-Capillary: 120 mg/dL — ABNORMAL HIGH (ref 70–99)

## 2020-05-07 MED ORDER — FLUCONAZOLE 100 MG PO TABS
200.0000 mg | ORAL_TABLET | Freq: Once | ORAL | Status: AC
  Administered 2020-05-07: 200 mg via ORAL
  Filled 2020-05-07: qty 2

## 2020-05-07 NOTE — Significant Event (Signed)
Hypoglycemic Event  [id number]  Treatment: 4 oz juice/soda   Symptoms: None  Follow-up CBG: Time:1655 CBG Result72  Possible Reasons for Event: Inadequate meal intake  Comments/MD notified:Pamela Love PA    Gelene Mink

## 2020-05-07 NOTE — Significant Event (Signed)
Pt took oral mouthwash and nyastatin swish/swallow in the morning however, she has been refusing since then, I have educated her to the risks associated with refusal of medication. She is still refusing. Delle Reining made aware

## 2020-05-07 NOTE — Progress Notes (Signed)
Physical Therapy Session Note  Patient Details  Name: Lindsey [name] MRN: [id number] Date of Birth: 1955-12-02  Today's Date: 05/07/2020 PT Missed Time: 30 Minutes Missed Time Reason: Patient fatigue     Pt received supine in bed with her grandson present. Pt with expressive aphasia but able to communicate that she did not want to participate in therapy at this time due to being tired. Therapist encouraged patient; however, she politely requested to rest today and reports that she would participate tomorrow. Pt left supine in bed with needs in reach, bed alarm on, and family present. Missed 30 minutes of skilled physical therapy.   Ginny Forth , PT, DPT, CSRS  05/07/2020, 4:03 PM

## 2020-05-07 NOTE — Progress Notes (Signed)
Speech Language Pathology Weekly Progress and Session Note  Patient Details  Name: Lindsey [name] MRN: [id number] Date of Birth: May 20, 1956  Beginning of progress report period: April 30, 2020 End of progress report period: May 07, 2020  Today's Date: 05/07/2020 SLP Individual Time: 2595-6387 SLP Individual Time Calculation (min): 45 min  Short Term Goals: Week 1: SLP Short Term Goal 1 (Week 1): Pt will increase oral clearance with dysphagia 2 diet with use of compensatory strategies x85% min A. SLP Short Term Goal 1 - Progress (Week 1): Met SLP Short Term Goal 2 (Week 1): Pt will increase ability to follow simple one step commands related to adls F64% min A. SLP Short Term Goal 2 - Progress (Week 1): Met SLP Short Term Goal 3 (Week 1): Pt will name functional objects on 5/10 trials with max multimodal cues. SLP Short Term Goal 3 - Progress (Week 1): Not met SLP Short Term Goal 4 (Week 1): Pt will express wants/needs at phrase level with max verbal cues. SLP Short Term Goal 4 - Progress (Week 1): Not met SLP Short Term Goal 5 (Week 1): Pt will answer (gestures or word level) simple yes/no questions with 70% accuracy. SLP Short Term Goal 5 - Progress (Week 1): Met    New Short Term Goals: Week 2: SLP Short Term Goal 1 (Week 2): Patient will consume current diet with minimal overt s/s of aspiration and overall supervision level verbal and visual cues for use of swallowing compensatory strategies. SLP Short Term Goal 2 (Week 2): Patient will attend/scan to right visual field in 75% of opportunities with Min A verbal and visual cues. SLP Short Term Goal 3 (Week 2): Patient will answer mildly abstract yes/no questions with 75% accuracy and Min A verbal cues. SLP Short Term Goal 4 (Week 2): Patient will produce simple labial/vowel CV and CVC combinations with Min A multimodal cues and 75% accuracy SLP Short Term Goal 5 (Week 2): Patient will express basic wants/needs with use of multimodal  communication with Mod A multimodal cues.  Weekly Progress Updates: Patient has made functional gains and has met 3 of 5 STGs this reporting period. Currently, patient is consuming Dys. 2 textures with thin liquids with intermittent overt s/s of aspiration and overall Min A verbal and visual cues for use of swallowing compensatory strategies. Patient continues to demonstrate severe expressive deficits but can produce simple CV and CVC combinations with labial sounds with 50% accuracy and overall Max A multimodal cues. Patient also demonstrates improved spontaneous verbal output and comprehension of basic information. Patient continues to require overall Mod A verbal cues for her right inattention during functional tasks. Patient and family education ongoing. Patient would benefit from continued skilled SLP intervention to maximize her swallowing and cognitive functioning and functional communication prior to discharge.     Intensity: Minumum of 1-2 x/day, 30 to 90 minutes Frequency: 3 to 5 out of 7 days Duration/Length of Stay: 05/29/20 Treatment/Interventions: Cognitive remediation/compensation;Cueing hierarchy;Functional tasks;Patient/family education;Speech/Language facilitation;Dysphagia/aspiration precaution training;Internal/external aids;Environmental controls;Therapeutic Activities   Daily Session  Skilled Therapeutic Interventions: Skilled treatment session focused on dysphagia and communication goals. SLP facilitated session by providing Min A verbal and visual cues for use of a slow rate and to clear right buccal pocketing with breakfast meal of Dys. 2 textures with thin liquids. No overt s/s of aspiration were observed. Recommend to continue current with full supervision. SLP also facilitated session by providing overall Mod A semantic and phonemic cues for production of CV and CVC  combinations X 5 repetitions. Patient with intermittent perseveration and unable to produce the vowel /ah/  despite Max A multimodal cues. Patient left upright in the wheelchair with alarm on and all needs within reach. Continue with current plan of care.      Pain No/Denies Pain   Therapy/Group: Individual Therapy  Diyari Cherne 05/07/2020, 6:39 AM

## 2020-05-07 NOTE — Progress Notes (Signed)
[place] PHYSICAL MEDICINE & REHABILITATION PROGRESS NOTE   Subjective/Complaints: Up in chair. No new issues today. Ate some of her breakfast.   ROS: limited due to language/communication   Objective:   No results found. No results for input(s): WBC, HGB, HCT, PLT in the last 72 hours. No results for input(s): NA, K, CL, CO2, GLUCOSE, BUN, CREATININE, CALCIUM in the last 72 hours.  Intake/Output Summary (Last 24 hours) at 05/07/2020 1041 Last data filed at 05/06/2020 1817 Gross per 24 hour  Intake 160 ml  Output --  Net 160 ml     Physical Exam: Vital Signs Blood pressure (!) 134/91, pulse 80, temperature 98.2 F (36.8 C), temperature source Oral, resp. rate 18, height 5\' 5"  (1.651 m), weight 74.4 kg, SpO2 100 %. Constitutional: No distress . Vital signs reviewed. HEENT: EOMI, oral membranes moist Neck: supple Cardiovascular: RRR without murmur. No JVD    Respiratory/Chest: CTA Bilaterally without wheezes or rales. Normal effort    GI/Abdomen: BS +, non-tender, non-distended Ext: no clubbing, cyanosis, or edema Psych: flat Skin:  C/D/I Neuro: right central 7 and tongue deviaiton.  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 Occasionally garbled speech.  Right sided hemiplegia 0/5 RUE and tr/5 prox to 0/5 distally RLE. Left side 5/5--no motor changes today    Assessment/Plan: 1. 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  Physiatrist is providing close team supervision and 24 hour management of active medical problems listed below.  Physiatrist and rehab team continue to assess barriers to discharge/monitor patient progress toward functional and medical goals  Care Tool:  Bathing    Body parts bathed by patient: Chest, Abdomen, Front perineal area, Left upper leg, Face, Right upper leg, Right lower leg   Body  parts bathed by helper: Right arm, Left arm, Buttocks, Right lower leg, Left lower leg     Bathing assist Assist Level: Moderate Assistance - Patient 50 - 74%     Upper Body Dressing/Undressing Upper body dressing   What is the patient wearing?: Pull over shirt    Upper body assist Assist Level: Moderate Assistance - Patient 50 - 74%    Lower Body Dressing/Undressing Lower body dressing      What is the patient wearing?: Incontinence brief, Pants     Lower body assist Assist for lower body dressing: Maximal Assistance - Patient 25 - 49%     Toileting Toileting    Toileting assist Assist for toileting: Total Assistance - Patient < 25%     Transfers Chair/bed transfer  Transfers assist  Chair/bed transfer activity did not occur: Safety/medical concerns  Chair/bed transfer assist level: Maximal Assistance - Patient 25 - 49%     Locomotion Ambulation   Ambulation assist   Ambulation activity did not occur: Safety/medical concerns  Assist level: Minimal Assistance - Patient > 75% Assistive device: Walker-rolling     Walk 10 feet activity   Assist  Walk 10 feet activity did not occur: Safety/medical concerns        Walk 50 feet activity   Assist Walk 50 feet with 2 turns activity did not occur: Safety/medical concerns         Walk 150 feet activity   Assist Walk 150 feet activity did not occur: Safety/medical concerns         Walk 10 feet on uneven surface  activity   Assist Walk 10  feet on uneven surfaces activity did not occur: Safety/medical concerns         Wheelchair     Assist Will patient use wheelchair at discharge?: Yes Type of Wheelchair: Manual    Wheelchair assist level: Moderate Assistance - Patient 50 - 74% Max wheelchair distance: 10 ft    Wheelchair 50 feet with 2 turns activity    Assist        Assist Level: Maximal Assistance - Patient 25 - 49%   Wheelchair 150 feet activity     Assist       Assist Level: Total Assistance - Patient < 25%   Blood pressure (!) 134/91, pulse 80, temperature 98.2 F (36.8 C), temperature source Oral, resp. rate 18, height 5\' 5"  (1.651 m), weight 74.4 kg, SpO2 100 %.    Medical Problem List and Plan: 1.  R hemiplegia and impaired function/expressive aphasia and R inattention secondary to L MCA stroke with hemorrhagic extension- with dysphagia- on D2 with thins                -patient may  shower             -ELOS/Goals: 8/27, goals Mod I to min A  -Continue CIR  -most recent CT with residual blood. Repeat monday to help determine when to start [facility]    2.  Antithrombotics: -DVT/anticoagulation:  Pharmaceutical: Lovenox             -antiplatelet therapy:  ASA only due to bleed.    3. Pain Management: Tylenol prn. Well controlled 4. Mood: LCSW to follow for evaluation and support.              -antipsychotic agents: N/a 5. Neuropsych: This patient is not fully capable of making decisions on her  own behalf. 6. Skin/Wound Care: Routine pressure relief measures. IVs may be removed. 7. Fluids/Electrolytes/Nutrition: Monitor I/O. Na is 136 on 8/2 8. A fib with RVR: Monitor HR bid--continue metoprolol 75 mg bid.   -HR in 90's  - NO AC at this time due to bleed.  9. HTN: Monitor BP tid.  On Lopressor 75 mg bid  8/2: diastolic continues to be elevated. Added amlodipine 5mg .   8/3-5. Improvement, dbp a little higher in am. Observe for now 10 Dysphagia: On Dysphagia 2, thin liquids. Encourage intake.   -family can bring in food from home with in dietary restrictions.  11. ETOH abuse: Has not had issues with withdrawal.  12. Prediabetes: Hgb A1c-6.1.  CBG (last 3)  Recent Labs    05/06/20 2118 05/07/20 0617 05/07/20 0639  GLUCAP 74 76 102*    8/4-5 cbg's low. Intake is poor. A little better this morning   -on no carb restrictions   -no megace d/t asa only for afib   -continue encourage PO intake, family bringing in food from home 13. ABLA:  Hgb 12.5 on 8/2.   LOS: 7 days A FACE TO FACE EVALUATION WAS PERFORMED  07/07/20 05/07/2020, 10:41 AM

## 2020-05-07 NOTE — Progress Notes (Signed)
Physical Therapy Weekly Progress Note  Patient Details  Name: Lindsey [name] MRN: [id number] Date of Birth: Apr 09, 1956  Beginning of progress report period: May 01, 2020 End of progress report period: May 07, 2020  Today's Date: 05/07/2020 PT Individual Time: 2725-3664 PT Individual Time Calculation (min): 70 min   Patient has met 2 of 4 short term goals.  Patient with slow, but steady progress this week limited by fatigue and intermittent decreased participation. Patient has required significant encouragement to participate in therapy this week due to the emotional impact of her physical and verbal deficits with improved affect toward the end of the week following gains in mobility. She currently requires mod A for bed mobility, mod A for squat pivot transfers, min A for sit-to-stand transfers using L rail, max A gait training 30 ft x4 with use of L rail R knee cage, and DF wrap with eversion bias, and propelled the w/c >150 ft with min A-supervision using L hemi-technique.  Patient continues to demonstrate the following deficits muscle weakness, decreased cardiorespiratoy endurance, impaired timing and sequencing, abnormal tone and decreased motor planning, decreased midline orientation, decreased attention to right, right side neglect and decreased motor planning, decreased initiation, decreased attention, decreased awareness, decreased problem solving and decreased safety awareness and decreased sitting balance, decreased standing balance, decreased postural control, hemiplegia and decreased balance strategies and therefore will continue to benefit from skilled PT intervention to increase functional independence with mobility.  Patient progressing toward long term goals..  Continue plan of care.  PT Short Term Goals Week 1:  PT Short Term Goal 1 (Week 1): Pt will complete bed<>w/c transfers with min assist. PT Short Term Goal 1 - Progress (Week 1): Progressing toward goal PT Short Term Goal  2 (Week 1): Pt will ambulate 30 ft with LRAD & max assist. PT Short Term Goal 2 - Progress (Week 1): Met PT Short Term Goal 3 (Week 1): Pt will propel w/c 75 ft with min assist. PT Short Term Goal 3 - Progress (Week 1): Met PT Short Term Goal 4 (Week 1): Pt will complete bed mobility with min assist. PT Short Term Goal 4 - Progress (Week 1): Progressing toward goal Week 2:  PT Short Term Goal 1 (Week 2): Patient will perform bed mobility with min A. PT Short Term Goal 2 (Week 2): Patient will perform basic transfers with min A. PT Short Term Goal 3 (Week 2): Patient will ambulate 50 feet with max A and LRAD. PT Short Term Goal 4 (Week 2): Patient will perform standing balance >5 min with min A with L UE support.  Skilled Therapeutic Interventions/Progress Updates:     Patient in bed upon PT arrival. Patient alert and agreeable to PT session. Patient denied pain during session. Patient initially needed min encouragement for getting OOB for therapy, but progressed to enthusiastic participation during session in reaction to progress with gait and R LE motor control this session. Continues to use minimal verbal responses with 1-3 word phrases intermittently. Use of head nods for yes/no accurate throughout session today.    Therapeutic Activity: Bed Mobility: Patient performed supine to/from sit with mod A for trunk and LE magement. Provided verbal cues for using of L UE to push rather than pull up to sitting. Transfers: Patient performed squat pivot transfers bed<>w/c with mod A x1 and min A x1 with improved use of LEs following gait training. Provided cues for hand placement, foot placement with facilitation for R foot, and head-hips relationship for  proper technique and decreased assist with transfers. Patient performed sit to/from stand x5 with min A-CGA using L hand rail. Provided verbal cues for foot placement, forward weight shift, and controlled descent for safety. Blocked R knee with all  transfers due to buckling.  Gait Training:  Patient ambulated 15 feet and 30 feet x3 using L rail with R knee cage and DF wrap with eversion bias with max A for trunk support, weight shifting, and max-total A for R limb advancement and knee control in stance. Ambulated with step-to gait pattern leading with R, flexed R knee in stance, forward trunk lean, and intermittent R toe out. Able to initiate limb advancement on R intermittently throughout. Provided multimodal cues for sequencing, erect posture, R limb advancement, R quad activation, and looking ahead in a mirror for visual feedback.  Wheelchair Mobility:  Patient was transported in the w/c with total A throughout session for energy conservation and time management.  Neuromuscular Re-ed: Patient performed the following LE and UE motor control activities for increased motor activation: -seated R knee flexion/extension 2x10 in sock for reduced friction/resistance with PNF tapping to quad and hamstrings to promote muscle activation -seated R foot kicking a ball placed in front of her without assist progressing from kicking the ball 2 ft to >10 ft over 5 min trial -R UE gross grasp holding PT's hand 2x8, trace activation of thumb and index finger flexion -standing balance focused on R quad activation holding L hand rail with mirror for visual feedback ~2 min  Patient in bed with shoes doffed with total A at end of session with breaks locked, bed alarm set, and all needs within reach.    Therapy Documentation Precautions:  Precautions Precautions: Fall Precaution Comments: R neglect, R hemi Restrictions Weight Bearing Restrictions: No  Therapy/Group: Individual Therapy  Aveena Bari L Donia Yokum PT, DPT  05/07/2020, 11:01 AM

## 2020-05-07 NOTE — Progress Notes (Signed)
Pt in bed able to communicate with head gestures. Pt denies any pain or needs at this time. In bed television turned on for patient. Call light within reach of left hand

## 2020-05-07 NOTE — Progress Notes (Signed)
Occupational Therapy Session Note  Patient Details  Name: Lindsey [name] MRN: [id number] Date of Birth: October 13, 1955  Today's Date: 05/07/2020 OT Individual Time: 0700-0800 OT Individual Time Calculation (min): 60 min    Short Term Goals: Week 1:  OT Short Term Goal 1 (Week 1): Pt will locate grooming items on sink level R of midline wiht MIN A OT Short Term Goal 2 (Week 1): Pt will recall hemi dressing techniques wiht MOD cues to don shirt OT Short Term Goal 3 (Week 1): Pt will transfer to toilet wiht MOD A and LRAD OT Short Term Goal 4 (Week 1): Pt will recall washing RUE with MIN VC  Skilled Therapeutic Interventions/Progress Updates:    1:1. Pt received in bed agreeable to OT. Pt with no report of pain and wanting to shower. stedy utilized to transfer into shower with CGA and VC for L lean to power up in stedy . Pt bathes with A to position RLE into seated figure 4 to doffing socks and washing feet. Pt able to use lateral leans to wash buttocks with MIN A for sitting balance and HOH A to wash LUE with RUE for NMR. Pt dresses with A to position RLE into figure 4 and VC for thread LLE first. Pt able to recall hemi dressing techniques with UB dressing threading LLE first with A and A to pull shirt overhead. Pt declines oral care and able to don B socks with MIN A to place R toes into R sock. OT installs elastic laces and pt able to don B shoes. Exited session with pt seated in w/c awairing SLP arrival with saebo stim on on R deltoid for estim unattended for 60 min, belt alarm on and call light in reach.  Saebo Stim One 330 pulse width 35 Hz pulse rate On 8 sec/ off 8 sec Ramp up/ down 2 sec Symmetrical Biphasic wave form  Max intensity at 500 Ohm load Skin in tact at end of 60 min pt tolerated well.    Therapy Documentation Precautions:  Precautions Precautions: Fall Precaution Comments: R neglect, R hemi Restrictions Weight Bearing Restrictions: No General:   Vital  Signs: Therapy Vitals Temp: 98.2 F (36.8 C) Temp Source: Oral Pulse Rate: 80 Resp: 18 BP: (!) 134/91 Patient Position (if appropriate): Lying Oxygen Therapy SpO2: 100 % O2 Device: Room Air Pain:   ADL: ADL Grooming: Moderate assistance Where Assessed-Grooming: Edge of bed Upper Body Bathing: Moderate assistance Where Assessed-Upper Body Bathing: Shower Lower Body Bathing: Maximal assistance Where Assessed-Lower Body Bathing: Shower Upper Body Dressing: Maximal assistance Where Assessed-Upper Body Dressing: Wheelchair Lower Body Dressing: Dependent Where Assessed-Lower Body Dressing: Wheelchair, Other (Comment) (stedy) Toileting: Dependent (stedy) Where Assessed-Toileting: Bedside Commode Toilet Transfer: Dependent Toilet Transfer Method:  (stedy) Acupuncturist: Drop arm bedside Corporate investment banker: Dependent (stedy) Vision   Perception    Praxis   Exercises:   Other Treatments:     Therapy/Group: Individual Therapy  Shon Hale 05/07/2020, 7:56 AM

## 2020-05-08 ENCOUNTER — Inpatient Hospital Stay (HOSPITAL_COMMUNITY): Payer: Self-pay | Admitting: Physical Therapy

## 2020-05-08 ENCOUNTER — Inpatient Hospital Stay (HOSPITAL_COMMUNITY): Payer: Self-pay | Admitting: Speech Pathology

## 2020-05-08 ENCOUNTER — Inpatient Hospital Stay (HOSPITAL_COMMUNITY): Payer: Self-pay

## 2020-05-08 LAB — GLUCOSE, CAPILLARY
Glucose-Capillary: 86 mg/dL (ref 70–99)
Glucose-Capillary: 116 mg/dL — ABNORMAL HIGH (ref 70–99)
Glucose-Capillary: 75 mg/dL (ref 70–99)
Glucose-Capillary: 84 mg/dL (ref 70–99)

## 2020-05-08 MED ORDER — AMLODIPINE BESYLATE 10 MG PO TABS
10.0000 mg | ORAL_TABLET | Freq: Every day | ORAL | Status: DC
  Administered 2020-05-09 – 2020-05-29 (×21): 10 mg via ORAL
  Filled 2020-05-08 (×21): qty 1

## 2020-05-08 NOTE — Progress Notes (Signed)
[place] PHYSICAL MEDICINE & REHABILITATION PROGRESS NOTE   Subjective/Complaints: Sitting in bed. No problems reported  ROS: limited due to language/communication   Objective:   No results found. No results for input(s): WBC, HGB, HCT, PLT in the last 72 hours. No results for input(s): NA, K, CL, CO2, GLUCOSE, BUN, CREATININE, CALCIUM in the last 72 hours.  Intake/Output Summary (Last 24 hours) at 05/08/2020 1154 Last data filed at 05/08/2020 0700 Gross per 24 hour  Intake 100 ml  Output --  Net 100 ml     Physical Exam: Vital Signs Blood pressure (!) 138/98, pulse 90, temperature 98.9 F (37.2 C), resp. rate 18, height 5\' 5"  (1.651 m), weight 77 kg, SpO2 98 %. Constitutional: No distress . Vital signs reviewed. HEENT: EOMI, oral membranes moist Neck: supple Cardiovascular: RRR without murmur. No JVD    Respiratory/Chest: CTA Bilaterally without wheezes or rales. Normal effort    GI/Abdomen: BS +, non-tender, non-distended Ext: no clubbing, cyanosis, or edema Psych: pleasant but flat Skin:  C/D/I Neuro: right central 7 and tongue deviaiton.  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 No real changed. Minimal, Occasionally garbled speech.  Right sided hemiplegia 0/5 RUE and tr/5 prox to 0/5 distally RLE. Left side 5/5--no motor changes today    Assessment/Plan: 1. 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  Physiatrist is providing close team supervision and 24 hour management of active medical problems listed below.  Physiatrist and rehab team continue to assess barriers to discharge/monitor patient progress toward functional and medical goals  Care Tool:  Bathing    Body parts bathed by patient: Chest, Abdomen, Front perineal area, Left upper leg, Face, Right upper leg, Right lower leg   Body parts bathed by helper: Right arm, Left arm,  Buttocks, Right lower leg, Left lower leg     Bathing assist Assist Level: Moderate Assistance - Patient 50 - 74%     Upper Body Dressing/Undressing Upper body dressing   What is the patient wearing?: Pull over shirt    Upper body assist Assist Level: Moderate Assistance - Patient 50 - 74%    Lower Body Dressing/Undressing Lower body dressing      What is the patient wearing?: Incontinence brief, Pants     Lower body assist Assist for lower body dressing: Maximal Assistance - Patient 25 - 49%     Toileting Toileting    Toileting assist Assist for toileting: Total Assistance - Patient < 25%     Transfers Chair/bed transfer  Transfers assist  Chair/bed transfer activity did not occur: Safety/medical concerns  Chair/bed transfer assist level: Moderate Assistance - Patient 50 - 74%     Locomotion Ambulation   Ambulation assist   Ambulation activity did not occur: Safety/medical concerns  Assist level: Maximal Assistance - Patient 25 - 49% Assistive device: Other (comment) (L rail, DF wrap with eversion, knee cage) Max distance: 30 ft   Walk 10 feet activity   Assist  Walk 10 feet activity did not occur: Safety/medical concerns  Assist level: Maximal Assistance - Patient 25 - 49% Assistive device: Other (comment) (L rail, DF wrap with eversion, knee cage)   Walk 50 feet activity   Assist Walk 50 feet with 2 turns activity did not occur: Safety/medical concerns         Walk 150 feet activity   Assist Walk 150 feet activity did not occur: Safety/medical concerns  Walk 10 feet on uneven surface  activity   Assist Walk 10 feet on uneven surfaces activity did not occur: Safety/medical concerns         Wheelchair     Assist Will patient use wheelchair at discharge?: Yes Type of Wheelchair: Manual    Wheelchair assist level: Moderate Assistance - Patient 50 - 74% Max wheelchair distance: 10 ft    Wheelchair 50 feet with 2  turns activity    Assist        Assist Level: Maximal Assistance - Patient 25 - 49%   Wheelchair 150 feet activity     Assist      Assist Level: Total Assistance - Patient < 25%   Blood pressure (!) 138/98, pulse 90, temperature 98.9 F (37.2 C), resp. rate 18, height 5\' 5"  (1.651 m), weight 77 kg, SpO2 98 %.    Medical Problem List and Plan: 1.  R hemiplegia and impaired function/expressive aphasia and R inattention secondary to L MCA stroke with hemorrhagic extension- with dysphagia- on D2 with thins                -patient may  shower             -ELOS/Goals: 8/27, goals Mod I to min A  -Continue CIR  -most recent CT with residual blood. Repeat monday to help determine when to start [facility]    2.  Antithrombotics: -DVT/anticoagulation:  Pharmaceutical: Lovenox             -antiplatelet therapy:  ASA only due to bleed.    3. Pain Management: Tylenol prn. Well controlled 4. Mood: LCSW to follow for evaluation and support.              -antipsychotic agents: N/a 5. Neuropsych: This patient is not fully capable of making decisions on her  own behalf. 6. Skin/Wound Care: Routine pressure relief measures. IVs may be removed. 7. Fluids/Electrolytes/Nutrition: Monitor I/O. Na is 136 on 8/2 8. A fib with RVR: Monitor HR bid--continue metoprolol 75 mg bid.   -HR in 90's  - NO AC at this time due to bleed.  9. HTN: Monitor BP tid.  On Lopressor 75 mg bid  8/2: diastolic continues to be elevated. Added amlodipine 5mg .   8/6 continue DBP elevation. Increase amlodipine to 10mg  10 Dysphagia: On Dysphagia 2, thin liquids. Encourage intake.   -family can bring in food from home with in dietary restrictions.  11. ETOH abuse: Has not had issues with withdrawal.  12. Prediabetes: Hgb A1c-6.1.  CBG (last 3)  Recent Labs    05/07/20 1656 05/07/20 2112 05/08/20 0627  GLUCAP 72 120* 86    8/4-6 cbg's low/nl. Intake is inconsistent to poor.    -on no carb restrictions   -no megace  d/t asa only for afib   -continue encourage PO intake, family bringing in food from home   -weight sl up today (77kg) from admit.   -check BMET/prealbumin on monday 13. ABLA: Hgb 12.5 on 8/2.   LOS: 8 days A FACE TO FACE EVALUATION WAS PERFORMED  2113 05/08/2020, 11:54 AM

## 2020-05-08 NOTE — Progress Notes (Signed)
Physical Therapy Session Note  Patient Details  Name: Lindsey [name] MRN: [id number] Date of Birth: [birth year]  Today's Date: 05/08/2020 PT Individual Time:  0 min PT individual time missed time :60 min.    Short Term Goals: Week 1:  PT Short Term Goal 1 (Week 1): Pt will complete bed<>w/c transfers with min assist. PT Short Term Goal 1 - Progress (Week 1): Progressing toward goal PT Short Term Goal 2 (Week 1): Pt will ambulate 30 ft with LRAD & max assist. PT Short Term Goal 2 - Progress (Week 1): Met PT Short Term Goal 3 (Week 1): Pt will propel w/c 75 ft with min assist. PT Short Term Goal 3 - Progress (Week 1): Met PT Short Term Goal 4 (Week 1): Pt will complete bed mobility with min assist. PT Short Term Goal 4 - Progress (Week 1): Progressing toward goal  Skilled Therapeutic Interventions/Progress Updates: Pt presents supine in bed and asleep.  Pt required increased time to arouse.  Pt states unable to participate w/ therapy d/t fatigue.  Pt still refuses even w/ encouragement.  Pt remained in bed w/ bed alarm on and all needs in reach.     Therapy Documentation Precautions:  Precautions Precautions: Fall Precaution Comments: R neglect, R hemi Restrictions Weight Bearing Restrictions: No General: PT Amount of Missed Time (min): 60 Minutes PT Missed Treatment Reason: Patient fatigue Vital Signs:  Pain: Pain Assessment Pain Scale: 0-10 Pain Score: 0-No pain Mobility:   Locomotion :    Trunk/Postural Assessment :    Balance:   Exercises:   Other Treatments:      Therapy/Group: Individual Therapy  Ladoris Gene 05/08/2020, 2:12 PM

## 2020-05-08 NOTE — Progress Notes (Signed)
Physical Therapy Session Note  Patient Details  Name: Lindsey [name] MRN: [id number] Date of Birth: 26-Oct-1955  Today's Date: 05/08/2020 PT Individual Time: 1124-1202 PT Individual Time Calculation (min): 38 min   Short Term Goals: Week 2:  PT Short Term Goal 1 (Week 2): Patient will perform bed mobility with min A. PT Short Term Goal 2 (Week 2): Patient will perform basic transfers with min A. PT Short Term Goal 3 (Week 2): Patient will ambulate 50 feet with max A and LRAD. PT Short Term Goal 4 (Week 2): Patient will perform standing balance >5 min with min A with L UE support.  Skilled Therapeutic Interventions/Progress Updates:  Pt received in w/c & agreeable to tx. Transported pt to gym via w/c dependent assist. Sit<>stand at rail with mod assist & attempted to have pt engage in pre-gait stepping with LLE with PT blocking R knee to prevent A/P instability with focus on RLE strengthening & NMR but pt with decreased ability to weight shift to R to allow stepping of LLE. Standing at high low table pt engaged in moving objects across midline with focus on RLE strengthening & NMR through weight bearing, as well as standing balance. Pt with decreased ability to weight shift R and requires mod assist for standing balance. Pt then reports HA but declines medication. Pt requires encouragement to propel w/c back to room with L hemi technique with mod assist for obstacle avoidance & max cuing 2/2 decreased attention overall. Back in room pt reports no further HA; BP = 99/78 mmHg, HR = 68 bpm - nurse made aware. Pt left in w/c with chair alarm donned, call bell in reach.  Therapy Documentation Precautions:  Precautions Precautions: Fall Precaution Comments: R neglect, R hemi Restrictions Weight Bearing Restrictions: No    Therapy/Group: Individual Therapy  Sandi Mariscal 05/08/2020, 12:35 PM

## 2020-05-08 NOTE — Progress Notes (Signed)
Speech Language Pathology Daily Session Note  Patient Details  Name: Briseida Gittings MRN: [id number] Date of Birth: [birth year]  Today's Date: 05/08/2020 SLP Individual Time: 4098-1191 SLP Individual Time Calculation (min): 55 min  Short Term Goals: Week 2: SLP Short Term Goal 1 (Week 2): Patient will consume current diet with minimal overt s/s of aspiration and overall supervision level verbal and visual cues for use of swallowing compensatory strategies. SLP Short Term Goal 2 (Week 2): Patient will attend/scan to right visual field in 75% of opportunities with Min A verbal and visual cues. SLP Short Term Goal 3 (Week 2): Patient will answer mildly abstract yes/no questions with 75% accuracy and Min A verbal cues. SLP Short Term Goal 4 (Week 2): Patient will produce simple labial/vowel CV and CVC combinations with Min A multimodal cues and 75% accuracy SLP Short Term Goal 5 (Week 2): Patient will express basic wants/needs with use of multimodal communication with Mod A multimodal cues.  Skilled Therapeutic Interventions: Skilled treatment session focused on dysphagia and cognitive goals. SLP facilitated session by providing overall Min A verbal cues for use of swallowing compensatory strategies with breakfast meal of Dys. 1 textures with thin liquids. Patient without overt s/s of aspiration but continues to demonstrate minimal PO intake at times. SLP also facilitated session by providing extra time and overall supervision level verbal cues for problem solving and visual scanning to the right field of environment during a basic calendar making task. Patient left upright in bed with alarm on and all needs within reach. Continue with current plan of care.      Pain Pain Assessment Pain Scale: 0-10 Pain Score: 0-No pain  Therapy/Group: Individual Therapy  [place] 05/08/2020, 10:48 AM

## 2020-05-08 NOTE — Progress Notes (Signed)
Occupational Therapy Session Note  Patient Details  Name: Lindsey [name] MRN: [id number] Date of Birth: 1956/09/03  Today's Date: 05/08/2020 OT Individual Time: 0930-1015 OT Individual Time Calculation (min): 45 min    Short Term Goals: Week 1:  OT Short Term Goal 1 (Week 1): Pt will locate grooming items on sink level R of midline wiht MIN A OT Short Term Goal 2 (Week 1): Pt will recall hemi dressing techniques wiht MOD cues to don shirt OT Short Term Goal 3 (Week 1): Pt will transfer to toilet wiht MOD A and LRAD OT Short Term Goal 4 (Week 1): Pt will recall washing RUE with MIN VC  Skilled Therapeutic Interventions/Progress Updates:    1:1. Pt received in bed agreeable to OT with no pain indicated. Pt completes dressing seated EOB for focus on R attention, hemi dressing and sit/standing balance. Pt completes doffing shirt with max VC for pulling overhead first. Pt dons shirt with MIN A for pulling in RUE and total A for LOB posteriorly when turning R to locate clothing placed on R. Pt dons pants with A to positiong RLE into figure 4 and pt able to thread LLE with no A. STS at EOB with MOD A with pt steadying self on standard chair while OT advances pants past hips. Pt lateral scoot with SB to R with VC for hand placemen and counting for initiation and MIN A overall. Pt completes oral care at sink with MOD question cues to locate all items on R and HOH A to facilitate RUE in bimanual tasks. Tp completes 2x5 squats at sink with OT facilitating to prevent hyperextension at knee and R knee buckling. Exited sesison with pt seated in w/c with half lap tray/belt alarm on, saebo stim on R deltoid and call light  in reach  Saebo Stim One60 min unattended- pt tol well skin in tact at end of 60 330 pulse width 35 Hz pulse rate On 8 sec/ off 8 sec Ramp up/ down 2 sec Symmetrical Biphasic wave form  Max intensity at 500 Ohm load   Therapy Documentation Precautions:  Precautions Precautions:  Fall Precaution Comments: R neglect, R hemi Restrictions Weight Bearing Restrictions: No General:   Vital Signs:   Pain:   ADL: ADL Grooming: Moderate assistance Where Assessed-Grooming: Edge of bed Upper Body Bathing: Moderate assistance Where Assessed-Upper Body Bathing: Shower Lower Body Bathing: Maximal assistance Where Assessed-Lower Body Bathing: Shower Upper Body Dressing: Maximal assistance Where Assessed-Upper Body Dressing: Wheelchair Lower Body Dressing: Dependent Where Assessed-Lower Body Dressing: Wheelchair, Other (Comment) (stedy) Toileting: Dependent (stedy) Where Assessed-Toileting: Bedside Commode Toilet Transfer: Dependent Toilet Transfer Method:  (stedy) Acupuncturist: Drop arm bedside Corporate investment banker: Dependent (stedy) Vision   Perception    Praxis   Exercises:   Other Treatments:     Therapy/Group: Individual Therapy  Shon Hale 05/08/2020, 10:00 AM

## 2020-05-08 NOTE — Plan of Care (Signed)
  Problem: Consults Goal: RH STROKE PATIENT EDUCATION Description: See Patient Education module for education specifics  Outcome: Progressing Goal: Nutrition Consult-if indicated Outcome: Progressing Goal: Diabetes Guidelines if Diabetic/Glucose > 140 Description: If diabetic or lab glucose is > 140 mg/dl - Initiate Diabetes/Hyperglycemia Guidelines & Document Interventions  Outcome: Progressing   Problem: RH BOWEL ELIMINATION Goal: RH STG MANAGE BOWEL WITH ASSISTANCE Description: STG Manage Bowel with Assistance. Outcome: Progressing Goal: RH STG MANAGE BOWEL W/MEDICATION W/ASSISTANCE Description: STG Manage Bowel with Medication with Assistance. Outcome: Progressing   Problem: RH BLADDER ELIMINATION Goal: RH STG MANAGE BLADDER WITH ASSISTANCE Description: STG Manage Bladder With Assistance Outcome: Progressing Goal: RH STG MANAGE BLADDER WITH MEDICATION WITH ASSISTANCE Description: STG Manage Bladder With Medication With Assistance. Outcome: Progressing Goal: RH STG MANAGE BLADDER WITH EQUIPMENT WITH ASSISTANCE Description: STG Manage Bladder With Equipment With Assistance Outcome: Progressing   Problem: RH SKIN INTEGRITY Goal: RH STG SKIN FREE OF INFECTION/BREAKDOWN Outcome: Progressing Goal: RH STG MAINTAIN SKIN INTEGRITY WITH ASSISTANCE Description: STG Maintain Skin Integrity With Assistance. Outcome: Progressing Goal: RH STG ABLE TO PERFORM INCISION/WOUND CARE W/ASSISTANCE Description: STG Able To Perform Incision/Wound Care With Assistance. Outcome: Progressing   Problem: RH SAFETY Goal: RH STG ADHERE TO SAFETY PRECAUTIONS W/ASSISTANCE/DEVICE Description: STG Adhere to Safety Precautions With Assistance/Device. Outcome: Progressing Goal: RH STG DECREASED RISK OF FALL WITH ASSISTANCE Description: STG Decreased Risk of Fall With Assistance. Outcome: Progressing   Problem: RH COGNITION-NURSING Goal: RH STG USES MEMORY AIDS/STRATEGIES W/ASSIST TO PROBLEM  SOLVE Description: STG Uses Memory Aids/Strategies With Assistance to Problem Solve. Outcome: Progressing Goal: RH STG ANTICIPATES NEEDS/CALLS FOR ASSIST W/ASSIST/CUES Description: STG Anticipates Needs/Calls for Assist With Assistance/Cues. Outcome: Progressing   Problem: RH PAIN MANAGEMENT Goal: RH STG PAIN MANAGED AT OR BELOW PT'S PAIN GOAL Outcome: Progressing   Problem: RH KNOWLEDGE DEFICIT Goal: RH STG INCREASE KNOWLEDGE OF DIABETES Outcome: Progressing Goal: RH STG INCREASE KNOWLEDGE OF HYPERTENSION Outcome: Progressing Goal: RH STG INCREASE KNOWLEDGE OF DYSPHAGIA/FLUID INTAKE Outcome: Progressing Goal: RH STG INCREASE KNOWLEGDE OF HYPERLIPIDEMIA Outcome: Progressing Goal: RH STG INCREASE KNOWLEDGE OF STROKE PROPHYLAXIS Outcome: Progressing   Problem: RH Vision Goal: RH LTG Vision (Specify) Outcome: Progressing   

## 2020-05-09 ENCOUNTER — Inpatient Hospital Stay (HOSPITAL_COMMUNITY): Payer: Self-pay | Admitting: Physical Therapy

## 2020-05-09 LAB — GLUCOSE, CAPILLARY
Glucose-Capillary: 96 mg/dL (ref 70–99)
Glucose-Capillary: 101 mg/dL — ABNORMAL HIGH (ref 70–99)

## 2020-05-09 NOTE — Progress Notes (Signed)
PHYSICAL MEDICINE & REHABILITATION PROGRESS NOTE   Subjective/Complaints:  Severe aphasia, pt alert watching TV  ROS: limited due to language/communication   Objective:   No results found. No results for input(s): WBC, HGB, HCT, PLT in the last 72 hours. No results for input(s): NA, K, CL, CO2, GLUCOSE, BUN, CREATININE, CALCIUM in the last 72 hours.  Intake/Output Summary (Last 24 hours) at 05/09/2020 0947 Last data filed at 05/09/2020 0815 Gross per 24 hour  Intake 476 ml  Output 300 ml  Net 176 ml     Physical Exam: Vital Signs Blood pressure 139/86, pulse (!) 105, temperature 98.1 F (36.7 C), temperature source Oral, resp. rate 18, height 5\' 5"  (1.651 m), weight 74.2 kg, SpO2 100 %.  General: No acute distress Mood and affect are appropriate Heart: Regular rate and rhythm no rubs murmurs or extra sounds Lungs: Clear to auscultation, breathing unlabored, no rales or wheezes Abdomen: Positive bowel sounds, soft nontender to palpation, nondistended Extremities: No clubbing, cyanosis, or edema Skin: No evidence of breakdown, no evidence of rash  Neuro: right central 7 and tongue deviaiton.  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 No real changed. Minimal, Occasionally garbled speech.  Right sided hemiplegia 0/5 RUE and tr/5 prox to 0/5 distally RLE. Left side 5/5--no motor changes 8/7    Assessment/Plan: 1. 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  Physiatrist is providing close team supervision and 24 hour management of active medical problems listed below.  Physiatrist and rehab team continue to assess barriers to discharge/monitor patient progress toward functional and medical goals  Care Tool:  Bathing    Body parts bathed by patient: Chest, Abdomen, Front perineal area, Left upper leg, Face, Right upper leg, Right lower  leg   Body parts bathed by helper: Right arm, Left arm, Buttocks, Right lower leg, Left lower leg     Bathing assist Assist Level: Moderate Assistance - Patient 50 - 74%     Upper Body Dressing/Undressing Upper body dressing   What is the patient wearing?: Pull over shirt    Upper body assist Assist Level: Moderate Assistance - Patient 50 - 74%    Lower Body Dressing/Undressing Lower body dressing      What is the patient wearing?: Incontinence brief, Pants     Lower body assist Assist for lower body dressing: Maximal Assistance - Patient 25 - 49%     Toileting Toileting    Toileting assist Assist for toileting: Total Assistance - Patient < 25%     Transfers Chair/bed transfer  Transfers assist  Chair/bed transfer activity did not occur: Safety/medical concerns  Chair/bed transfer assist level: Moderate Assistance - Patient 50 - 74%     Locomotion Ambulation   Ambulation assist   Ambulation activity did not occur: Safety/medical concerns  Assist level: Maximal Assistance - Patient 25 - 49% Assistive device: Other (comment) (L rail, DF wrap with eversion, knee cage) Max distance: 30 ft   Walk 10 feet activity   Assist  Walk 10 feet activity did not occur: Safety/medical concerns  Assist level: Maximal Assistance - Patient 25 - 49% Assistive device: Other (comment) (L rail, DF wrap with eversion, knee cage)   Walk 50 feet activity   Assist Walk 50 feet with 2 turns activity did not occur: Safety/medical concerns         Walk 150 feet activity   Assist Walk  150 feet activity did not occur: Safety/medical concerns         Walk 10 feet on uneven surface  activity   Assist Walk 10 feet on uneven surfaces activity did not occur: Safety/medical concerns         Wheelchair     Assist Will patient use wheelchair at discharge?: Yes Type of Wheelchair: Manual    Wheelchair assist level: Moderate Assistance - Patient 50 - 74% Max  wheelchair distance: 150 ft    Wheelchair 50 feet with 2 turns activity    Assist        Assist Level: Moderate Assistance - Patient 50 - 74%   Wheelchair 150 feet activity     Assist      Assist Level: Moderate Assistance - Patient 50 - 74%   Blood pressure 139/86, pulse (!) 105, temperature 98.1 F (36.7 C), temperature source Oral, resp. rate 18, height 5\' 5"  (1.651 m), weight 74.2 kg, SpO2 100 %.    Medical Problem List and Plan: 1.  R hemiplegia and impaired function/expressive aphasia and R inattention secondary to L MCA stroke with hemorrhagic extension- with dysphagia- on D2 with thins                -patient may  shower             -ELOS/Goals: 8/27, goals Mod I to min A  -Continue CIR  -most recent CT with residual blood. Repeat monday to help determine when to start [facility]    2.  Antithrombotics: -DVT/anticoagulation:  Pharmaceutical: Lovenox             -antiplatelet therapy:  ASA only due to bleed.    3. Pain Management: Tylenol prn. Well controlled 4. Mood: LCSW to follow for evaluation and support.              -antipsychotic agents: N/a 5. Neuropsych: This patient is not fully capable of making decisions on her  own behalf. 6. Skin/Wound Care: Routine pressure relief measures. IVs may be removed. 7. Fluids/Electrolytes/Nutrition: Monitor I/O. Na is 136 on 8/2 8. A fib with RVR: Monitor HR bid--continue metoprolol 75 mg bid.   -HR in 90's  - NO AC at this time due to bleed.  9. HTN: Monitor BP tid.  On Lopressor 75 mg bid  8/2: diastolic continues to be elevated. Added amlodipine 5mg .   8/6 continue DBP elevation. Increase amlodipine to 10mg  10 Dysphagia: On Dysphagia 2, thin liquids. Encourage intake.   -family can bring in food from home with in dietary restrictions.  11. ETOH abuse: Has not had issues with withdrawal.  12. Prediabetes: Hgb A1c-6.1.  CBG (last 3)  Recent Labs    05/08/20 1651 05/08/20 2107 05/09/20 0612  GLUCAP 75 84 96     CBG all normal no insulin needed, d/c CBG and SSI  13. ABLA: Hgb 12.5 on 8/2.   LOS: 9 days A FACE TO FACE EVALUATION WAS PERFORMED  07/08/20 05/09/2020, 9:47 AM

## 2020-05-09 NOTE — Progress Notes (Signed)
Occupational Therapy Weekly Progress Note  Patient Details  Name: Lindsey [name] MRN: [id number] Date of Birth: 06/03/1956  Beginning of progress report period: May 01, 2020 End of progress report period: May 09, 2020   Patient has met 4 of 4 short term goals.  Pt has made good progress this date demoing slowly improved R inattention, intermittent trace R bicep activation, min A UB dressing, intermittent recall of hemi strategies, and mod A functional transfers. Pt continues to demo dense R hemiplegia, decreased midline orientation, poor standing/fair sitting balance and cognitive deficits impacting functional performance during ADLs  Patient continues to demonstrate the following deficits: muscle weakness, decreased cardiorespiratoy endurance, impaired timing and sequencing, abnormal tone, unbalanced muscle activation, decreased coordination and decreased motor planning, decreased visual perceptual skills, decreased midline orientation and right side neglect, decreased initiation, decreased attention, decreased awareness, decreased problem solving, decreased safety awareness, decreased memory and delayed processing and decreased sitting balance, decreased standing balance, decreased postural control, hemiplegia and decreased balance strategies and therefore will continue to benefit from skilled OT intervention to enhance overall performance with BADL.  Patient progressing toward long term goals..  Continue plan of care.  OT Short Term Goals Week 1:  OT Short Term Goal 1 (Week 1): Pt will locate grooming items on sink level R of midline wiht MIN A OT Short Term Goal 1 - Progress (Week 1): Met OT Short Term Goal 2 (Week 1): Pt will recall hemi dressing techniques wiht MOD cues to don shirt OT Short Term Goal 2 - Progress (Week 1): Met OT Short Term Goal 3 (Week 1): Pt will transfer to toilet wiht MOD A and LRAD OT Short Term Goal 3 - Progress (Week 1): Met OT Short Term Goal 4 (Week 1): Pt  will recall washing RUE with MIN VC OT Short Term Goal 4 - Progress (Week 1): Met Week 2:  OT Short Term Goal 1 (Week 2): Pt will bathe 11/11 body parts with min VC for recalling positioning/hemi strategies to demo improved sequencing and R attention OT Short Term Goal 2 (Week 2): Pt will thread R extremities into clothing with no more than min question cues OT Short Term Goal 3 (Week 2): Pt will locate grooming items on R of sink wiht no VC but increased time OT Short Term Goal 4 (Week 2): Pt will sit to stand wiht MIN A in prep for CM   Therapy Documentation Precautions:  Precautions Precautions: Fall Precaution Comments: R neglect, R hemi Restrictions Weight Bearing Restrictions: No General:   Vital Signs: Therapy Vitals Temp: 98.1 F (36.7 C) Temp Source: Oral Pulse Rate: (!) 105 Resp: 18 BP: 139/86 Patient Position (if appropriate): Lying Oxygen Therapy SpO2: 100 % O2 Device: Room Air Pain: Pain Assessment Pain Scale: 0-10 Pain Score: 0-No pain ADL: ADL Grooming: Moderate assistance Where Assessed-Grooming: Edge of bed Upper Body Bathing: Moderate assistance Where Assessed-Upper Body Bathing: Shower Lower Body Bathing: Maximal assistance Where Assessed-Lower Body Bathing: Shower Upper Body Dressing: Maximal assistance Where Assessed-Upper Body Dressing: Wheelchair Lower Body Dressing: Dependent Where Assessed-Lower Body Dressing: Wheelchair, Other (Comment) (stedy) Toileting: Dependent (stedy) Where Assessed-Toileting: Bedside Commode Toilet Transfer: Dependent Toilet Transfer Method:  (stedy) Toilet Transfer Equipment: Drop arm bedside commode Walk-In Shower Transfer: Dependent (stedy) Vision   Perception    Praxis   Exercises:   Other Treatments:     Therapy/Group: Individual Therapy  Stephanie M Schlosser 05/09/2020, 6:52 AM  

## 2020-05-09 NOTE — Plan of Care (Signed)
  Problem: Consults Goal: RH STROKE PATIENT EDUCATION Description: See Patient Education module for education specifics  Outcome: Progressing Goal: Nutrition Consult-if indicated Outcome: Progressing Goal: Diabetes Guidelines if Diabetic/Glucose > 140 Description: If diabetic or lab glucose is > 140 mg/dl - Initiate Diabetes/Hyperglycemia Guidelines & Document Interventions  Outcome: Progressing   Problem: RH BOWEL ELIMINATION Goal: RH STG MANAGE BOWEL WITH ASSISTANCE Description: STG Manage Bowel with Assistance. Outcome: Progressing Goal: RH STG MANAGE BOWEL W/MEDICATION W/ASSISTANCE Description: STG Manage Bowel with Medication with Assistance. Outcome: Progressing   Problem: RH BLADDER ELIMINATION Goal: RH STG MANAGE BLADDER WITH ASSISTANCE Description: STG Manage Bladder With Assistance Outcome: Progressing Goal: RH STG MANAGE BLADDER WITH MEDICATION WITH ASSISTANCE Description: STG Manage Bladder With Medication With Assistance. Outcome: Progressing Goal: RH STG MANAGE BLADDER WITH EQUIPMENT WITH ASSISTANCE Description: STG Manage Bladder With Equipment With Assistance Outcome: Progressing   Problem: RH SKIN INTEGRITY Goal: RH STG SKIN FREE OF INFECTION/BREAKDOWN Outcome: Progressing Goal: RH STG MAINTAIN SKIN INTEGRITY WITH ASSISTANCE Description: STG Maintain Skin Integrity With Assistance. Outcome: Progressing Goal: RH STG ABLE TO PERFORM INCISION/WOUND CARE W/ASSISTANCE Description: STG Able To Perform Incision/Wound Care With Assistance. Outcome: Progressing   Problem: RH SAFETY Goal: RH STG ADHERE TO SAFETY PRECAUTIONS W/ASSISTANCE/DEVICE Description: STG Adhere to Safety Precautions With Assistance/Device. Outcome: Progressing Goal: RH STG DECREASED RISK OF FALL WITH ASSISTANCE Description: STG Decreased Risk of Fall With Assistance. Outcome: Progressing   Problem: RH COGNITION-NURSING Goal: RH STG USES MEMORY AIDS/STRATEGIES W/ASSIST TO PROBLEM  SOLVE Description: STG Uses Memory Aids/Strategies With Assistance to Problem Solve. Outcome: Progressing Goal: RH STG ANTICIPATES NEEDS/CALLS FOR ASSIST W/ASSIST/CUES Description: STG Anticipates Needs/Calls for Assist With Assistance/Cues. Outcome: Progressing   Problem: RH PAIN MANAGEMENT Goal: RH STG PAIN MANAGED AT OR BELOW PT'S PAIN GOAL Outcome: Progressing   Problem: RH KNOWLEDGE DEFICIT Goal: RH STG INCREASE KNOWLEDGE OF DIABETES Outcome: Progressing Goal: RH STG INCREASE KNOWLEDGE OF HYPERTENSION Outcome: Progressing Goal: RH STG INCREASE KNOWLEDGE OF DYSPHAGIA/FLUID INTAKE Outcome: Progressing Goal: RH STG INCREASE KNOWLEGDE OF HYPERLIPIDEMIA Outcome: Progressing Goal: RH STG INCREASE KNOWLEDGE OF STROKE PROPHYLAXIS Outcome: Progressing   Problem: RH Vision Goal: RH LTG Vision (Specify) Outcome: Progressing   

## 2020-05-09 NOTE — Progress Notes (Signed)
Physical Therapy Session Note  Patient Details  Name: Lindsey [name] MRN: [id number] Date of Birth: [birth year]  Today's Date: 05/09/2020 PT Individual Time: 0830-0930 PT Individual Time Calculation (min): 60 min   Short Term Goals: Week 1:  PT Short Term Goal 1 (Week 1): Pt will complete bed<>w/c transfers with min assist. PT Short Term Goal 1 - Progress (Week 1): Progressing toward goal PT Short Term Goal 2 (Week 1): Pt will ambulate 30 ft with LRAD & max assist. PT Short Term Goal 2 - Progress (Week 1): Met PT Short Term Goal 3 (Week 1): Pt will propel w/c 75 ft with min assist. PT Short Term Goal 3 - Progress (Week 1): Met PT Short Term Goal 4 (Week 1): Pt will complete bed mobility with min assist. PT Short Term Goal 4 - Progress (Week 1): Progressing toward goal Week 2:  PT Short Term Goal 1 (Week 2): Patient will perform bed mobility with min A. PT Short Term Goal 2 (Week 2): Patient will perform basic transfers with min A. PT Short Term Goal 3 (Week 2): Patient will ambulate 50 feet with max A and LRAD. PT Short Term Goal 4 (Week 2): Patient will perform standing balance >5 min with min A with L UE support.  Skilled Therapeutic Interventions/Progress Updates:    pt received in bed and agreeable to therapy. Pt denied pain at start and end of session. Pt directed in x2 rolling to R and L in bed for donning pants with max A to complete, pt attempting to pull pants up with LUE. Pt directed in transferring to sitting EOB with mod A x1 and single step cues. Pt directed in sitting EOB for several minutes at min A statically improving to CGA with manual facilitation for improved BLE placement and VC for trunk position. Pt then directed in donning bra and shirt at bedside with min A for dynamic balance, max A for dressing. Pt directed in transfer to [facility] with squat pivot at mod A x1 pt able to reach to armrest and assist self in transferring to chair with R knee blocking provided. Pt taken to gym in  [facility] at dependent level of assist for time management. Squat pivot to mat table at mod A x1 and VC for RUE placement and overall technique, R knee blocking provided. Pt directed in scooting backward on mat for improved position with min A and VC. Pt directed in trunk leans and LUE reaching in various directions and distances of reach at min A overall however mod A for recover of balance with LOB to R direction with cross midline reaching cross midline outside BOS, with VC and extra time pt able to improve recovery to min A when lost, x30 completed. Pt directed in trunk leans to elbow 2x10 on R and L at max A initially on R however quickly improved to min A to achieve position and return to midline with mirror feedback for all mat activities, tactile cues, VC and visual demonstration. Pt attempting to use LUE to pull self into sitting however able to correct to only use trunk to assist self in midline return. Pt directed in squat pivot to WC min A (all transfers to pt's L.). pt directed in [facility] mobility with use of LUE and LLE for propulsion, min A for navigation and maintaining in straight path, mod A for turns and greatly increased time, total of 76' with frequent stops. Pt returned the rest of the way to room by PT for  time management. Pt directed in functional tasks at sink in [facility] of tooth brushing, face washing, and UE washing as pt said yes to wanting to "get freshened up" after she simulating washing her face when PT asked what she wanted to do next,  Setup for face washing, total A for B arm washing, and pt required assistance for putting toothpaste on brush, and setup for cleanup after finishing brushing teeth. Pt left in WC, alarm belt set, All needs in reach and in good condition. Call light in hand.    Therapy Documentation Precautions:  Precautions Precautions: Fall Precaution Comments: R neglect, R hemi Restrictions Weight Bearing Restrictions: No      Therapy/Group: Individual Therapy  Junie Panning 05/09/2020, 10:25 AM

## 2020-05-10 ENCOUNTER — Inpatient Hospital Stay (HOSPITAL_COMMUNITY): Payer: Self-pay | Admitting: Physical Therapy

## 2020-05-10 NOTE — Progress Notes (Signed)
[place] PHYSICAL MEDICINE & REHABILITATION PROGRESS NOTE   Subjective/Complaints:  Severe aphasia,   ROS: limited due to language/communication   Objective:   No results found. No results for input(s): WBC, HGB, HCT, PLT in the last 72 hours. No results for input(s): NA, K, CL, CO2, GLUCOSE, BUN, CREATININE, CALCIUM in the last 72 hours.  Intake/Output Summary (Last 24 hours) at 05/10/2020 0837 Last data filed at 05/10/2020 0753 Gross per 24 hour  Intake 518 ml  Output --  Net 518 ml     Physical Exam: Vital Signs Blood pressure 123/83, pulse 93, temperature (!) 97.5 F (36.4 C), temperature source Oral, resp. rate 20, height 5\' 5"  (1.651 m), weight 73.2 kg, SpO2 100 %.  General: No acute distress Mood and affect are appropriate Heart: Regular rate and rhythm no rubs murmurs or extra sounds Lungs: Clear to auscultation, breathing unlabored, no rales or wheezes Abdomen: Positive bowel sounds, soft nontender to palpation, nondistended Extremities: No clubbing, cyanosis, or edema Skin: No evidence of breakdown, no evidence of rash  Musculoskeletal: Full range of motion in all 4 extremities. No joint swelling  Neuro: right central 7 and tongue deviaiton.  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 No real changed. Minimal, Occasionally garbled speech.  Right sided hemiplegia 0/5 RUE and tr/5 prox to 0/5 distally RLE. Left side 5/5--no motor changes 8/8    Assessment/Plan: 1. 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  Physiatrist is providing close team supervision and 24 hour management of active medical problems listed below.  Physiatrist and rehab team continue to assess barriers to discharge/monitor patient progress toward functional and medical goals  Care Tool:  Bathing    Body parts bathed by patient: Chest, Abdomen, Front  perineal area, Left upper leg, Face, Right upper leg, Right lower leg   Body parts bathed by helper: Right arm, Left arm, Buttocks, Right lower leg, Left lower leg     Bathing assist Assist Level: Moderate Assistance - Patient 50 - 74%     Upper Body Dressing/Undressing Upper body dressing   What is the patient wearing?: Pull over shirt    Upper body assist Assist Level: Moderate Assistance - Patient 50 - 74%    Lower Body Dressing/Undressing Lower body dressing      What is the patient wearing?: Incontinence brief, Pants     Lower body assist Assist for lower body dressing: Maximal Assistance - Patient 25 - 49%     Toileting Toileting    Toileting assist Assist for toileting: Total Assistance - Patient < 25%     Transfers Chair/bed transfer  Transfers assist  Chair/bed transfer activity did not occur: Safety/medical concerns  Chair/bed transfer assist level: Moderate Assistance - Patient 50 - 74%     Locomotion Ambulation   Ambulation assist   Ambulation activity did not occur: Safety/medical concerns  Assist level: Maximal Assistance - Patient 25 - 49% Assistive device: Other (comment) (L rail, DF wrap with eversion, knee cage) Max distance: 30 ft   Walk 10 feet activity   Assist  Walk 10 feet activity did not occur: Safety/medical concerns  Assist level: Maximal Assistance - Patient 25 - 49% Assistive device: Other (comment) (L rail, DF wrap with eversion, knee cage)   Walk 50 feet activity   Assist Walk 50 feet with 2 turns activity did not occur: Safety/medical concerns  Walk 150 feet activity   Assist Walk 150 feet activity did not occur: Safety/medical concerns         Walk 10 feet on uneven surface  activity   Assist Walk 10 feet on uneven surfaces activity did not occur: Safety/medical concerns         Wheelchair     Assist Will patient use wheelchair at discharge?: Yes Type of Wheelchair: Manual     Wheelchair assist level: Moderate Assistance - Patient 50 - 74% Max wheelchair distance: 150 ft    Wheelchair 50 feet with 2 turns activity    Assist        Assist Level: Moderate Assistance - Patient 50 - 74%   Wheelchair 150 feet activity     Assist      Assist Level: Moderate Assistance - Patient 50 - 74%   Blood pressure 123/83, pulse 93, temperature (!) 97.5 F (36.4 C), temperature source Oral, resp. rate 20, height 5\' 5"  (1.651 m), weight 73.2 kg, SpO2 100 %.    Medical Problem List and Plan: 1.  R hemiplegia and impaired function/expressive aphasia and R inattention secondary to L MCA stroke with hemorrhagic extension- with dysphagia- on D2 with thins                -patient may  shower             -ELOS/Goals: 8/27, goals Mod I to min A  -Continue CIR  -most recent CT with residual blood. Repeat monday to help determine when to start [facility]    2.  Antithrombotics: -DVT/anticoagulation:  Pharmaceutical: Lovenox             -antiplatelet therapy:  ASA only due to bleed.    3. Pain Management: Tylenol prn. Well controlled 4. Mood: LCSW to follow for evaluation and support.              -antipsychotic agents: N/a 5. Neuropsych: This patient is not fully capable of making decisions on her  own behalf. 6. Skin/Wound Care: Routine pressure relief measures. IVs may be removed. 7. Fluids/Electrolytes/Nutrition: Monitor I/O. Na is 136 on 8/2 8. A fib with RVR: Monitor HR bid--continue metoprolol 75 mg bid.   -HR in 90's  - NO AC at this time due to bleed.  9. HTN: Monitor BP tid.  On Lopressor 75 mg bid  8/2: diastolic continues to be elevated. Added amlodipine 5mg .   8/6 continue DBP elevation. Increase amlodipine to 10mg  Vitals:   05/09/20 1948 05/10/20 0504  BP: 104/71 123/83  Pulse: 81 93  Resp: 18 20  Temp: 98.4 F (36.9 C) (!) 97.5 F (36.4 C)  SpO2: 100% 100%  BPs controlled 8/8 monitor for dizziness 10 Dysphagia: On Dysphagia 2, thin liquids.  Encourage intake.   -family can bring in food from home with in dietary restrictions.  11. ETOH abuse: Has not had issues with withdrawal.  12. Prediabetes: Hgb A1c-6.1.  CBG (last 3)  Recent Labs    05/08/20 2107 05/09/20 0612 05/09/20 1207  GLUCAP 84 96 101*    CBG all normal no insulin needed, d/c CBG and SSI  13. ABLA: Hgb 12.5 on 8/2.   LOS: 10 days A FACE TO FACE EVALUATION WAS PERFORMED  2108 05/10/2020, 8:37 AM

## 2020-05-10 NOTE — Progress Notes (Signed)
Occupational Therapy Session Note  Patient Details  Name: Lindsey [name] MRN: [id number] Date of Birth: 1956/02/26  Today's Date: 05/10/2020 OT Individual Time: 1415-1510 OT Individual Time Calculation (min): 55 min    Short Term Goals: Week 2:  OT Short Term Goal 1 (Week 2): Pt will bathe 11/11 body parts with min VC for recalling positioning/hemi strategies to demo improved sequencing and R attention OT Short Term Goal 2 (Week 2): Pt will thread R extremities into clothing with no more than min question cues OT Short Term Goal 3 (Week 2): Pt will locate grooming items on R of sink wiht no VC but increased time OT Short Term Goal 4 (Week 2): Pt will sit to stand wiht MIN A in prep for CM  Skilled Therapeutic Interventions/Progress Updates:    Pt received supine with daughter present, smiling at therapist and agreeable to OT session. Pt completed bed mobility with min facilitation of RLE, mod cueing for using LLE to move RLE off the bed. With mod cueing overall pt able to transition from supine to sitting EOB with min A. Pt completed squat pivot transfer to w/c with mod A. Pt stating and nodding yes when asked if she had completed oral care after lunch- daughter stating she had not. Pt completed oral care with RUE HOH facilitation to hold and squeeze toothpaste. Pt able to brush teeth with no assist. Pt taken to therapy gym total A for time management. Max A for squat pivot to the mat. Pt completed RUE weightbearing activity with LUE reaching across midline. Max facilitation at elbow to maintain elbow extension. Poor sequencing of novel task initially with good carryover after mod cueing. Pt with 1 instance of pain reported in R hand with weightbearing. Pt returned to her w/c and then to her room . Pt completed stand pivot transfer to the [facility] over toilet with use of grab bar, max A and full blocking of the RLE. Pt required max A for peri hygiene/clothing management. Pt returned to w/c. She washed her  hands at the sink and edu was provided re importance of hemi hand hygiene. Pt requested to return to bed despite encouragement to stay in chair. Max A Stand pivot back to bed. Pt left supine with all needs met, bed alarm set.   Therapy Documentation Precautions:  Precautions Precautions: Fall Precaution Comments: R neglect, R hemi Restrictions Weight Bearing Restrictions: No   Therapy/Group: Individual Therapy  Curtis Sites 05/10/2020, 1:50 PM

## 2020-05-11 ENCOUNTER — Inpatient Hospital Stay (HOSPITAL_COMMUNITY): Payer: Medicaid Other

## 2020-05-11 ENCOUNTER — Inpatient Hospital Stay (HOSPITAL_COMMUNITY): Payer: Self-pay

## 2020-05-11 ENCOUNTER — Inpatient Hospital Stay (HOSPITAL_COMMUNITY): Payer: Self-pay | Admitting: Speech Pathology

## 2020-05-11 LAB — BASIC METABOLIC PANEL
Anion gap: 12 (ref 5–15)
BUN: 11 mg/dL (ref 8–23)
CO2: 22 mmol/L (ref 22–32)
Calcium: 9.4 mg/dL (ref 8.9–10.3)
Chloride: 101 mmol/L (ref 98–111)
Creatinine, Ser: 0.56 mg/dL (ref 0.44–1.00)
GFR calc Af Amer: >60 mL/min (ref >60)
GFR calc non Af Amer: >60 mL/min (ref >60)
Glucose, Bld: 112 mg/dL — ABNORMAL HIGH (ref 70–99)
Potassium: 4.2 mmol/L (ref 3.5–5.1)
Sodium: 135 mmol/L (ref 135–145)

## 2020-05-11 LAB — CBC
HCT: 34.8 % — ABNORMAL LOW (ref 36.0–46.0)
Hemoglobin: 11.7 g/dL — ABNORMAL LOW (ref 12.0–15.0)
MCH: 29.9 pg (ref 26.0–34.0)
MCHC: 33.6 g/dL (ref 30.0–36.0)
MCV: 89 fL (ref 80.0–100.0)
Platelets: 455 10*3/uL — ABNORMAL HIGH (ref 150–400)
RBC: 3.91 MIL/uL (ref 3.87–5.11)
RDW: 13.7 % (ref 11.5–15.5)
WBC: 4.3 10*3/uL (ref 4.0–10.5)
nRBC: 0 % (ref 0.0–0.2)

## 2020-05-11 LAB — PREALBUMIN: Prealbumin: 16.3 mg/dL — ABNORMAL LOW (ref 18–38)

## 2020-05-11 IMAGING — CT CT HEAD W/O CM
4 series · 16 of 47 positions shown, 18 images · non-contrast
Comparison: Prior head CT examinations [DATE] and earlier,
brain MRI [DATE]

CLINICAL DATA: Stroke, follow-up.

EXAM:
CT HEAD WITHOUT CONTRAST
TECHNIQUE: Contiguous axial images were obtained from the base of the skull
through the vertex without intravenous contrast.

[Series 3: head without · axial · non-contrast · 0.48mm/px · z∈[-86,+29]mm · 6 of 33 slices shown, 8 images]
[im 5/33  brain]
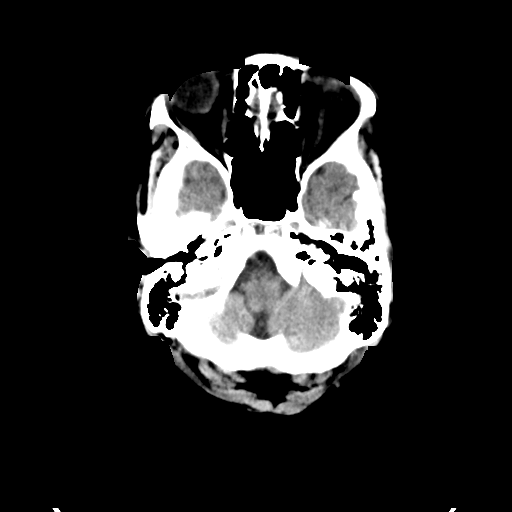
[im 5/33  bone]
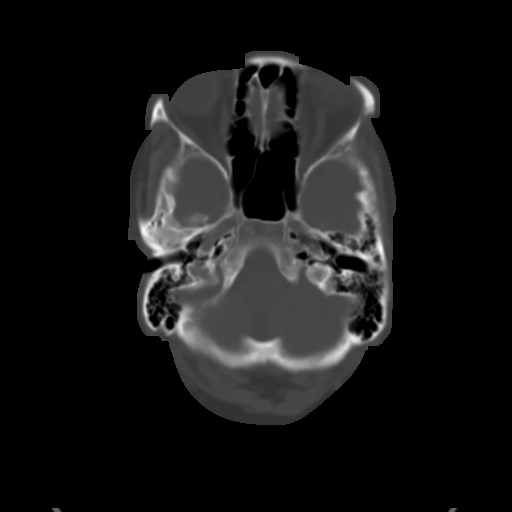
[im 10/33  brain]
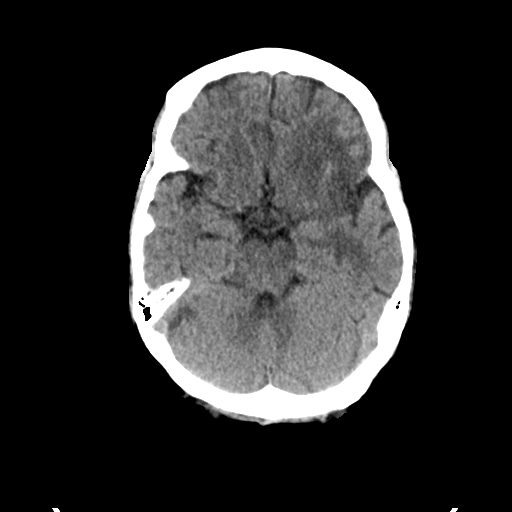
[im 14/33  brain]
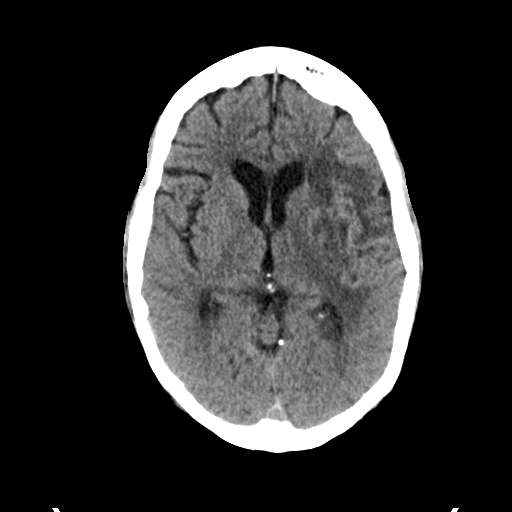
[im 19/33  brain]
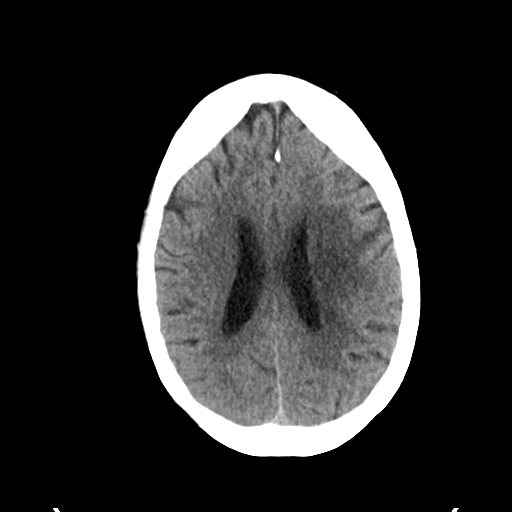
[im 23/33  brain]
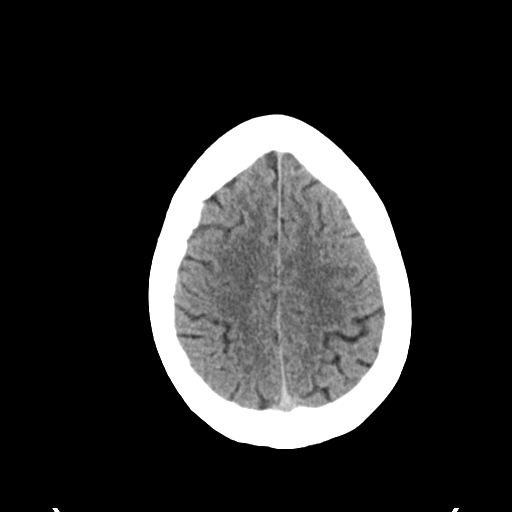
[im 23/33  bone]
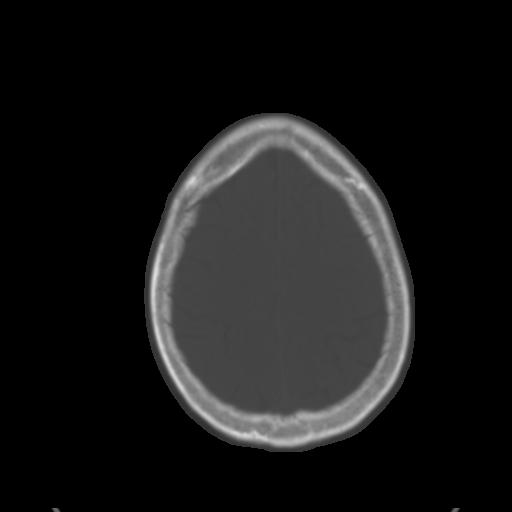
[im 28/33  brain]
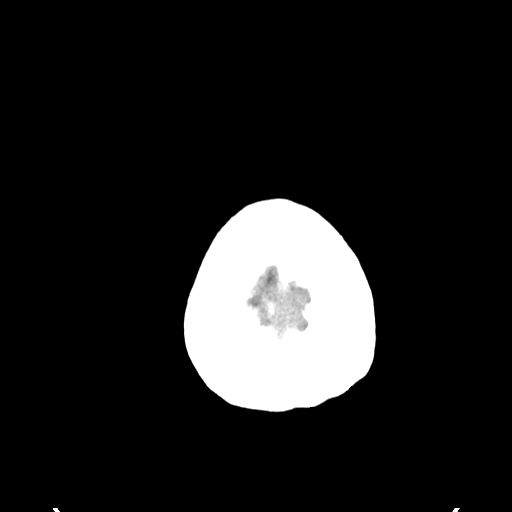

[Series 4: head bone · axial · 0.48mm/px · z∈[-90,-34]mm · 4 of 85 slices shown]
[im 9/85  bone]
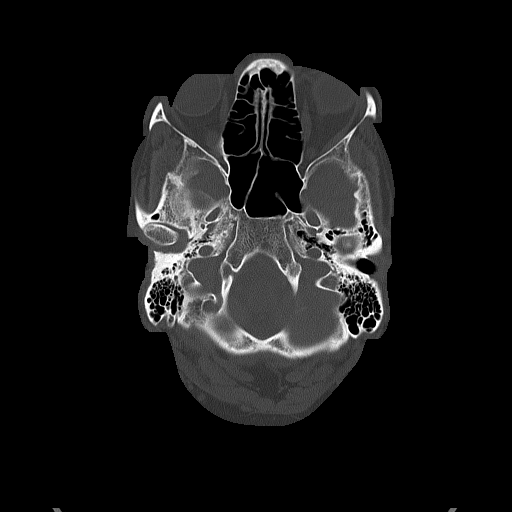
[im 17/85  bone]
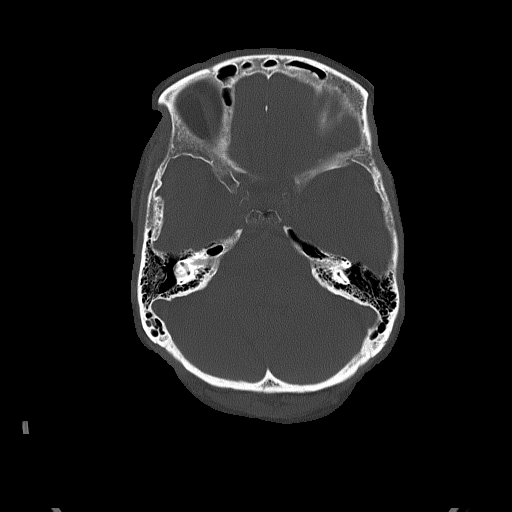
[im 29/85  bone]
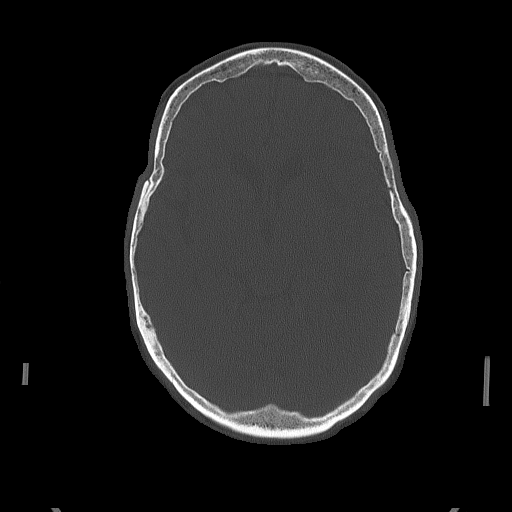
[im 37/85  bone]
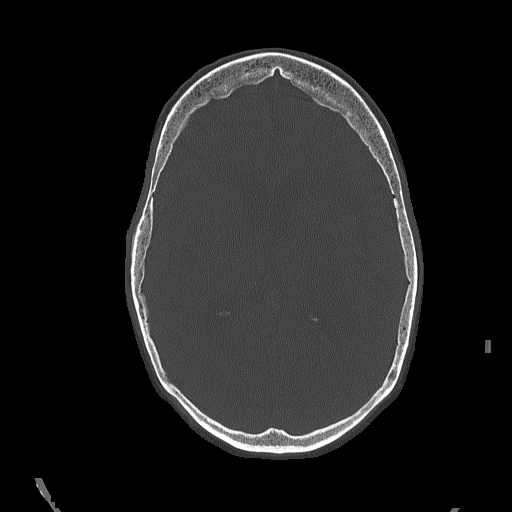

[Series 5: head without cor · coronal · non-contrast · 0.33mm/px · 3 of 74 slices shown]
[im 25/74  brain]
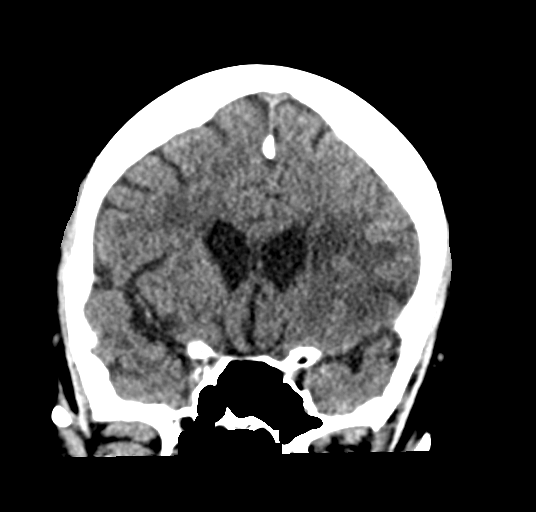
[im 33/74  brain]
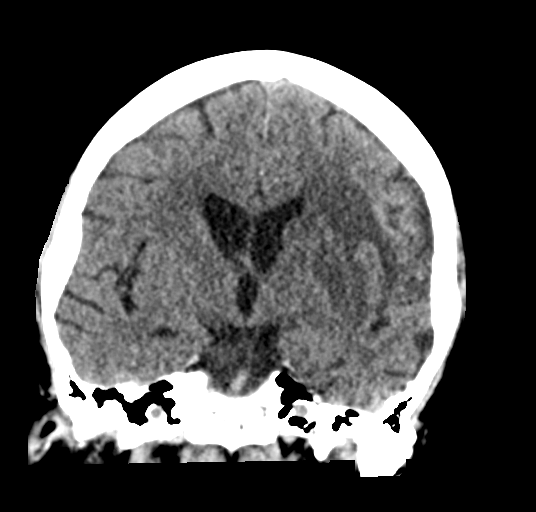
[im 41/74  brain]
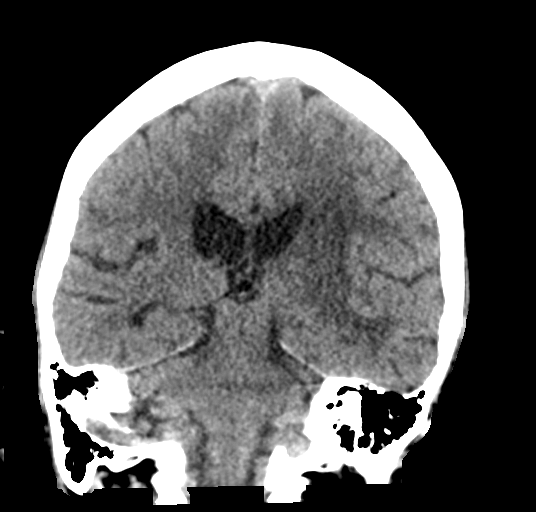

[Series 6: head without sag · sagittal · non-contrast · 0.33mm/px · 3 of 55 slices shown]
[im 19/55  brain]
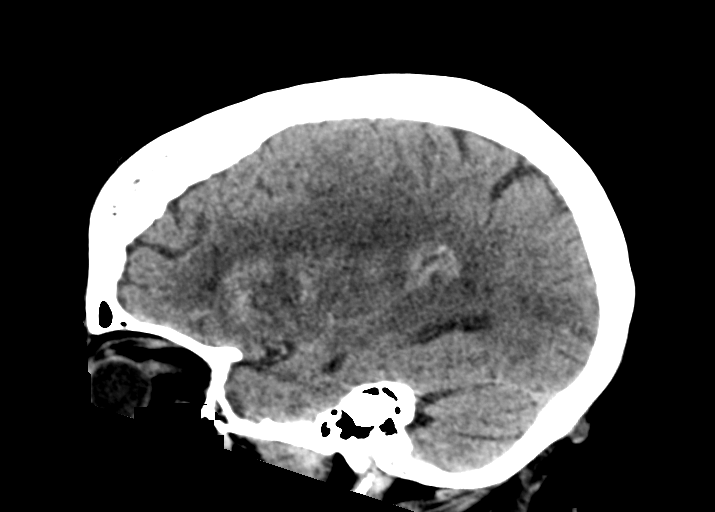
[im 28/55  brain]
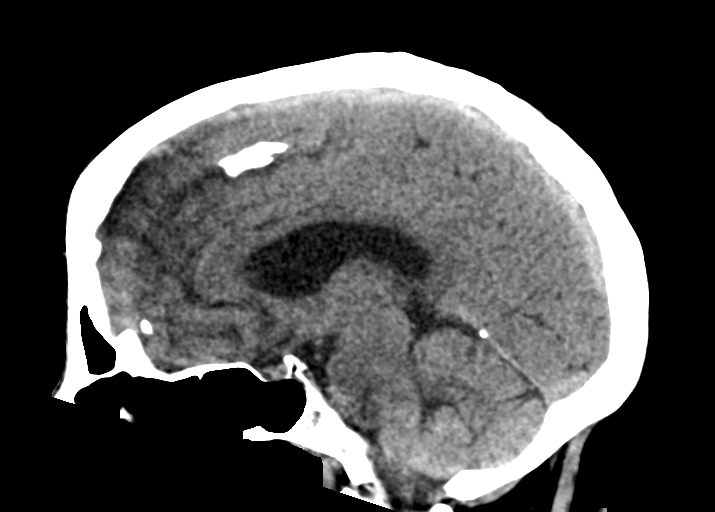
[im 37/55  brain]
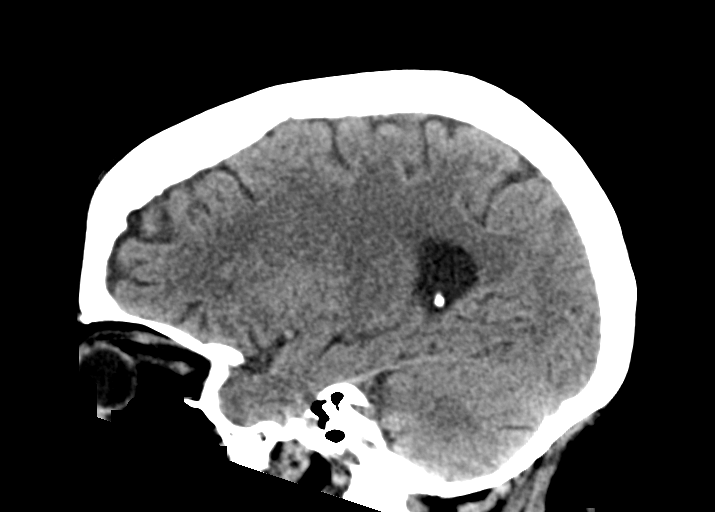

[16 of 47 positions shown; findings below may reference images not displayed]

FINDINGS: Brain:

A subacute ischemic infarct within the left MCA vascular territory
has not significantly changed in extent. Persistent although
decreased multifocal petechial hemorrhage within the infarction
territory. Persistent mild mass effect with trace 1-2 mm rightward
midline shift. No new intracranial hemorrhage is demonstrated. No
interval demarcated infarct is identified.

No extra-axial fluid collection.

No evidence of intracranial mass.

Vascular: No new finding.

Skull: Normal. Negative for fracture or focal lesion.

Sinuses/Orbits: Visualized orbits show no acute finding. Partial
opacification of a right ethmoid air cell. No significant mastoid
effusion
IMPRESSION: Evolving subacute left MCA territory infarct, unchanged in extent.
Persistent although decreased mild petechial hemorrhage. Suspected
superimposed cortical laminar necrosis.

Persistent mild mass effect with trace 1-2 mm rightward midline
shift.

## 2020-05-11 MED ORDER — MIRTAZAPINE 15 MG PO TBDP
15.0000 mg | ORAL_TABLET | Freq: Every day | ORAL | Status: DC
  Administered 2020-05-11 – 2020-05-28 (×18): 15 mg via ORAL
  Filled 2020-05-11 (×19): qty 1

## 2020-05-11 NOTE — Progress Notes (Signed)
Physical Therapy Session Note  Patient Details  Name: Lindsey [name] MRN: [id number] Date of Birth: 1956-05-01  Today's Date: 05/11/2020 PT Individual Time: 0800-0900 PT Individual Time Calculation (min): 60 min   Short Term Goals: Week 2:  PT Short Term Goal 1 (Week 2): Patient will perform bed mobility with min A. PT Short Term Goal 2 (Week 2): Patient will perform basic transfers with min A. PT Short Term Goal 3 (Week 2): Patient will ambulate 50 feet with max A and LRAD. PT Short Term Goal 4 (Week 2): Patient will perform standing balance >5 min with min A with L UE support.  Skilled Therapeutic Interventions/Progress Updates:     Patient sitting up in bed finishing breakfast with NT upon PT arrival. Patient alert and agreeable to PT session. Patient denied pain during session. Patient took 2 more bites of her potatoes with encouragement and full supervision for cues for taking small bites, chewing completely and checking for pocketing between bites. She then took 2 sips of coffee, on the second sip she began coughing, provided cues for forceful coughing and clearing her throat after. Patient declined anything else to eat or drink after.   Therapeutic Activity: Bed Mobility: Patient donned pants bed level with total A for time management and min A for bridging to lift hips. She performed supine to sit with min-mod A for trunk support. Provided verbal cues for rolling to R side-lying with use of bed rail, bringing B LEs off the bed with assist from her L LE to bring her R LE off the bed, then pushing to sit up using her L UE on the bed rail and R UE with elbow set under her side. Required min A for scooting EOB and total a for R LE placement. Patient sat EOB with supervision as she doffed/donned a shirt with mod A and max cues for sequencing and management of R UE.  Transfers: Patient performed a squat pivot transfer bed>w/c with min A and blocking R knee. Provided cues for hand placement and  head-hips relationship. She performed sit to/from stand x3 with min A-CGA holding L rail. Provided verbal cues for foot placement and forward weight shift.  Gait Training:  Donned B socks, tennis shoes, and R GRF AFO with total A prior to gait training. Patient ambulated 30 feet x2 using L rail with min-mod A for trunk support and weight shifting, and mod-max A for R LE limb advancement and foot placement. Ambulated with step-to gait pattern leading with R with R knee flexed in stance. Provided multimodal cues for erect posture, sequencing, limb advancement, and R quad and gluteal activation in stance.  Wheelchair Mobility:  Patient was transported in the w/c with total A throughout session for energy conservation and time management.  Neuromuscular Re-ed: Patient performed the following UE and LE motor control activities: -R UE elbow flexion/extension with PNF tapping to agonist muscle progressing form PROM to [facility] with patient performing 0-25% throughout full range 2x10 -R UE shoulder flexion with joint approximation at humeral head progressing from PROM to intermittent trace activation 2x5 -standing in front of a mirror, performed mini squats focused on R weight shift and R quad and gluteal activation for hip/knee extension and eccentric control during squat 2x10  Patient in w/c with R UE elevated on lap tray and pillow at end of session with breaks locked, seat belt alarm set, and all needs within reach.    Therapy Documentation Precautions:  Precautions Precautions: Fall Precaution Comments: R  neglect, R hemi Restrictions Weight Bearing Restrictions: No   Therapy/Group: Individual Therapy  Nerine Pulse L Aja Whitehair PT, DPT  05/11/2020, 12:58 PM

## 2020-05-11 NOTE — Progress Notes (Signed)
Speech Language Pathology Daily Session Note  Patient Details  Name: Lindsey [name] MRN: [id number] Date of Birth: 22-Sep-1956  Today's Date: 05/11/2020 SLP Individual Time: 1300-1358 SLP Individual Time Calculation (min): 58 min  Short Term Goals: Week 2: SLP Short Term Goal 1 (Week 2): Patient will consume current diet with minimal overt s/s of aspiration and overall supervision level verbal and visual cues for use of swallowing compensatory strategies. SLP Short Term Goal 2 (Week 2): Patient will attend/scan to right visual field in 75% of opportunities with Min A verbal and visual cues. SLP Short Term Goal 3 (Week 2): Patient will answer mildly abstract yes/no questions with 75% accuracy and Min A verbal cues. SLP Short Term Goal 4 (Week 2): Patient will produce simple labial/vowel CV and CVC combinations with Min A multimodal cues and 75% accuracy SLP Short Term Goal 5 (Week 2): Patient will express basic wants/needs with use of multimodal communication with Mod A multimodal cues.  Skilled Therapeutic Interventions: Pt was seen for skilled ST targeting dysphagia and speech goals. SLP facilitated session with an upgraded trail of dysphagia 3 solid (graham cracker) along with thin grape juice. She demonstrated trace increasing to Min A lingual residue during trial (which was relatively small in quantity due to pt's decreased appetite). She required Min A verbal cues to take smaller bite size and use liquid wash for oral clearance. No overt s/sx aspiration observed across thin and solid textures. Recommend continue current diet and ST will continue to provide skilled interventions and opportunities to work toward solid advancement. SLP further facilitated session with tasks to maximize verbal output and accuracy in production of  cv and cvc combinations beginning with primarily /m/ and /b/ phonemes. Pt did require Mod-Max A visual (written and phonetic placement cues with SLP wearing clear mask).  Sentence completion cues also intermittently successful to further elicit accurate single work productions. Pt did become perseverative on words at times, particularly "me" and other neologisms. In attempt to cease perseverations and maximize errorless learning opportunity, SLP engaged pt in automatic speech tasks. Pt counted 1-10 with 100% accuracy without need for cues. Min A verbal cues provided for accuracy in production of days of the week (90%), months of the year (70%), and the happy birthday song. Pt unable to state her own name, but did produce 2/4 family member names with written and Mod A phonemic cues. Pt left returned to room, left sitting in wheelchair with alarm set and needs within reach, visitor present. Continue per current plan of care.          Pain Pain Assessment Pain Scale: 0-10 Pain Score: 0-No pain  Therapy/Group: Individual Therapy  Little Ishikawa 05/11/2020, 2:20 PM

## 2020-05-11 NOTE — Progress Notes (Signed)
Occupational Therapy Session Note  Patient Details  Name: Lindsey [name] MRN: [id number] Date of Birth: January 01, 1956  Today's Date: 05/11/2020 OT Individual Time: 1100-1145 OT Individual Time Calculation (min): 45 min   Session 2: OT Individual Time: 1445-1520 OT Individual Time Calculation (min): 35 min  10 min missed 2/2 fatigue    Short Term Goals: Week 2:  OT Short Term Goal 1 (Week 2): Pt will bathe 11/11 body parts with min VC for recalling positioning/hemi strategies to demo improved sequencing and R attention OT Short Term Goal 2 (Week 2): Pt will thread R extremities into clothing with no more than min question cues OT Short Term Goal 3 (Week 2): Pt will locate grooming items on R of sink wiht no VC but increased time OT Short Term Goal 4 (Week 2): Pt will sit to stand wiht MIN A in prep for CM  Skilled Therapeutic Interventions/Progress Updates:    Pt received supine with sister present, agreeable to take shower with no c/o pain. Pt completed bed mobility to EOB with mod facilitation for RLE and RUE. Mod A overall to lift trunk. Pt completed squat pivot transfer to the w/c with mod A toward the L. Attempted stand pivot transfer into the shower but after 2 attempts, deemed it unsafe and used stedy instead. Pt completed sit > stand in the stedy with min A, total A for facilitation of the RUE. LB clothing doffed with mod A. Pt sat on TTB in shower for all bathing. Mod A for UB bathing, to lift RUE and to wash LUE. Pt completed LB bathing with min A while seated, using lateral leans. Charlaine Dalton was used to transfer pt back to w/c following shower. Pt required max A to don shirt, requiring max facilitation for use of hemi technique. Mod A to don pants. Pt completed oral care with set up assist. Pt was left sitting up in the w/c with all needs met, chair alarm set.   Session 2: Pt received sitting in the w/c with sister present. Pt able to convey fatigue with facial expressions and her sister  stating she was wanting to get back to bed. Pt agreeable to participate in OT session for 45 min. Pt was taken to the therapy gym total A for time management. Rowe Robert was placed on pt's RUE to monitor skin and pt reaction to ensure no adverse reaction before it is used unattended. Parameters listed below. Pt completed facilitated elbow flex/ext and shoulder horizontal abd/adduction and flex/ext. No active movement palpated in the hand, wrist, or elbow musculature. Active movement felt at the scapula with protraction. PROM performed at shoulder to ensure muscle elasticity and length maintained. Pt expressed some discomfort with ER. Pt indicated fatigue and pointed to go back to her room. Saebo removed, skin intact. Pt agreeable to allow OT to check brief standing at the sink. Pt had R knee buckle and required max A to maintain standing balance. Pt brief dry. She completed stand pivot transfer back to bed with mod A toward the R side. Pt was left supine with all needs met, bed alarm set.   Saebo Stim One 330 pulse width 35 Hz pulse rate On 8 sec/ off 8 sec Ramp up/ down 2 sec Symmetrical Biphasic wave form  Max intensity 123m at 500 Ohm load   Therapy Documentation Precautions:  Precautions Precautions: Fall Precaution Comments: R neglect, R hemi Restrictions Weight Bearing Restrictions: No  Therapy/Group: Individual Therapy  SCurtis Sites8/06/2020, 6:38 AM

## 2020-05-11 NOTE — Progress Notes (Signed)
Lindsey [name] PHYSICAL MEDICINE & REHABILITATION PROGRESS NOTE   Subjective/Complaints:  No new issues. Sitting comfortably in chair. Nurse reported no new problems  ROS: limited due to language/communication    Objective:   CT HEAD WO CONTRAST  Result Date: 05/11/2020 CLINICAL DATA:  Stroke, follow-up. EXAM: CT HEAD WITHOUT CONTRAST TECHNIQUE: Contiguous axial images were obtained from the base of the skull through the vertex without intravenous contrast. COMPARISON:  Prior head CT examinations 05/04/2020 and earlier, brain MRI 04/21/2020 FINDINGS: Brain: A subacute ischemic infarct within the left MCA vascular territory has not significantly changed in extent. Persistent although decreased multifocal petechial hemorrhage within the infarction territory. Persistent mild mass effect with trace 1-2 mm rightward midline shift. No new intracranial hemorrhage is demonstrated. No interval demarcated infarct is identified. No extra-axial fluid collection. No evidence of intracranial mass. Vascular: No new finding. Skull: Normal. Negative for fracture or focal lesion. Sinuses/Orbits: Visualized orbits show no acute finding. Partial opacification of a right ethmoid air cell. No significant mastoid effusion IMPRESSION: Evolving subacute left MCA territory infarct, unchanged in extent. Persistent although decreased mild petechial hemorrhage. 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 Persistent mild mass effect with trace 1-2 mm rightward midline shift. Electronically Signed   By: Jackey Loge DO   On: 05/11/2020 07:32   Recent Labs    05/11/20 0445  WBC 4.3  HGB 11.7*  HCT 34.8*  PLT 455*   Recent Labs    05/11/20 0445  NA 135  K 4.2  CL 101  CO2 22  GLUCOSE 112*  BUN 11  CREATININE 0.56  CALCIUM 9.4    Intake/Output Summary (Last 24 hours) at 05/11/2020 1418 Last data filed at 05/11/2020 1238 Gross per 24 hour  Intake 417 ml  Output --  Net 417 ml     Physical Exam: Vital  Signs Blood pressure 129/74, pulse 82, temperature 98.2 F (36.8 C), temperature source Oral, resp. rate 18, height 5\' 5"  (1.651 m), weight 73.2 kg, SpO2 100 %.  Constitutional: No distress . Vital signs reviewed. HEENT: EOMI, oral membranes moist Neck: supple Cardiovascular: RRR without murmur. No JVD    Respiratory/Chest: CTA Bilaterally without wheezes or rales. Normal effort    GI/Abdomen: BS +, non-tender, non-distended Ext: no clubbing, cyanosis, or edema Psych: pleasant and smilin Musculoskeletal: Full range of motion in all 4 extremities. No joint swelling  Neuro: right central 7 and tongue deviaiton.  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 Occasionally garbled speech.  Right sided hemiplegia 0/5 RUE and tr/5 prox to 0/5 distally RLE. Left side 5/5--no motor changes 8/9. No resting tone    Assessment/Plan: 1. 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  Physiatrist is providing close team supervision and 24 hour management of active medical problems listed below.  Physiatrist and rehab team continue to assess barriers to discharge/monitor patient progress toward functional and medical goals  Care Tool:  Bathing    Body parts bathed by patient: Chest, Abdomen, Front perineal area, Left upper leg, Face, Right upper leg, Right lower leg   Body parts bathed by helper: Right arm, Left arm, Buttocks, Right lower leg, Left lower leg     Bathing assist Assist Level: Moderate Assistance - Patient 50 - 74%     Upper Body Dressing/Undressing Upper body dressing   What is the patient wearing?: Pull over shirt    Upper body assist Assist  Level: Moderate Assistance - Patient 50 - 74%    Lower Body Dressing/Undressing Lower body dressing      What is the patient wearing?: Incontinence brief, Pants     Lower body assist Assist  for lower body dressing: Maximal Assistance - Patient 25 - 49%     Toileting Toileting    Toileting assist Assist for toileting: Total Assistance - Patient < 25%     Transfers Chair/bed transfer  Transfers assist  Chair/bed transfer activity did not occur: Safety/medical concerns  Chair/bed transfer assist level: Minimal Assistance - Patient > 75%     Locomotion Ambulation   Ambulation assist   Ambulation activity did not occur: Safety/medical concerns  Assist level: Moderate Assistance - Patient 50 - 74% Assistive device: Other (comment) (L rail) Max distance: 30 ft   Walk 10 feet activity   Assist  Walk 10 feet activity did not occur: Safety/medical concerns  Assist level: Moderate Assistance - Patient - 50 - 74% Assistive device: Other (comment) (L rail, DF wrap with eversion, knee cage)   Walk 50 feet activity   Assist Walk 50 feet with 2 turns activity did not occur: Safety/medical concerns         Walk 150 feet activity   Assist Walk 150 feet activity did not occur: Safety/medical concerns         Walk 10 feet on uneven surface  activity   Assist Walk 10 feet on uneven surfaces activity did not occur: Safety/medical concerns         Wheelchair     Assist Will patient use wheelchair at discharge?: Yes Type of Wheelchair: Manual    Wheelchair assist level: Moderate Assistance - Patient 50 - 74% Max wheelchair distance: 150 ft    Wheelchair 50 feet with 2 turns activity    Assist        Assist Level: Moderate Assistance - Patient 50 - 74%   Wheelchair 150 feet activity     Assist      Assist Level: Moderate Assistance - Patient 50 - 74%   Blood pressure 129/74, pulse 82, temperature 98.2 F (36.8 C), temperature source Oral, resp. rate 18, height 5\' 5"  (1.651 m), weight 73.2 kg, SpO2 100 %.    Medical Problem List and Plan: 1.  R hemiplegia and impaired function/expressive aphasia and R inattention secondary  to L MCA stroke with hemorrhagic extension- with dysphagia- on D2 with thins                -patient may  shower             -ELOS/Goals: 8/27, goals Mod I to min A  -Continue CIR  -head CT today with evolving infarct, persistent but decreased petechial hemorrhage. Will decide upon AC this week.    2.  Antithrombotics: -DVT/anticoagulation:  Pharmaceutical: Lovenox             -antiplatelet therapy:  ASA only due to bleed.    3. Pain Management: Tylenol prn. Well controlled 4. Mood: LCSW to follow for evaluation and support.              -antipsychotic agents: N/a 5. Neuropsych: This patient is not fully capable of making decisions on her  own behalf. 6. Skin/Wound Care: Routine pressure relief measures. IVs may be removed. 7. Fluids/Electrolytes/Nutrition: Monitor I/O. Na is 135 8/9  -prealbumin only 16.3 today 8/9  -add remeron at HS for appetite 8. A fib with RVR: Monitor HR bid--continue metoprolol  75 mg bid.   -HR in 90's  - NO ACstill at this time due to bleed.  9. HTN: Monitor BP tid.  On Lopressor 75 mg bid  8/2: diastolic continues to be elevated. Added amlodipine 5mg .   8/6 continue DBP elevation. Increase amlodipine to 10mg  Vitals:   05/10/20 1932 05/11/20 0504  BP: (!) 121/93 129/74  Pulse: 99 82  Resp: 19 18  Temp: 98.4 F (36.9 C) 98.2 F (36.8 C)  SpO2:  100%  BPs somewhat controlled 8/9 monitor for dizziness 10 Dysphagia: On Dysphagia 2, thin liquids. Encourage intake.   -family can bring in food from home with in dietary restrictions.  11. ETOH abuse: Has not had issues with withdrawal.  12. Prediabetes: Hgb A1c-6.1.  CBG (last 3)  Recent Labs    05/08/20 2107 05/09/20 0612 05/09/20 1207  GLUCAP 84 96 101*    CBG all normal no insulin needed, d/c'ed CBG and SSI  13. ABLA: Hgb 12.5 on 8/2.   LOS: 11 days A FACE TO FACE EVALUATION WAS PERFORMED  07/09/20 05/11/2020, 2:18 PM

## 2020-05-12 ENCOUNTER — Inpatient Hospital Stay (HOSPITAL_COMMUNITY): Payer: Self-pay

## 2020-05-12 ENCOUNTER — Inpatient Hospital Stay (HOSPITAL_COMMUNITY): Payer: Self-pay | Admitting: Physical Therapy

## 2020-05-12 ENCOUNTER — Inpatient Hospital Stay (HOSPITAL_COMMUNITY): Payer: Self-pay | Admitting: Speech Pathology

## 2020-05-12 MED ORDER — APIXABAN 5 MG PO TABS
5.0000 mg | ORAL_TABLET | Freq: Two times a day (BID) | ORAL | Status: DC
  Administered 2020-05-12 – 2020-05-29 (×35): 5 mg via ORAL
  Filled 2020-05-12 (×35): qty 1

## 2020-05-12 NOTE — Progress Notes (Signed)
Patient ID: Lindsey [name], female   DOB: 10-Jun-1956, 64 y.o.   MRN: [id number]  SW spoke with pt dtr [name] [phone]) to provide updates from team conference, and d/c date remaining 8/27. SW discussed family edu; currently scheduled for 8/20 with dtr and [name]; and 8/23 dtr and pt sister [name]. SW encouraged pt dtr to explore possible DME items needed such as w/c, BSC,and TTB. SW explained charity process with DME and HH. SW informed there will be follow-up once there is more information on d/c recommendations.   Cecile Sheerer, MSW, LCSWA Office: [phone] Cell: [phone] Fax: [phone]

## 2020-05-12 NOTE — Progress Notes (Signed)
Occupational Therapy Session Note  Patient Details  Name: Lindsey [name] MRN: [id number] Date of Birth: [birth year]  Today's Date: 05/12/2020 OT Individual Time: 1300-1315 OT Individual Time Calculation (min): 15 min  and Today's Date: 05/12/2020 OT Missed Time: 60 Minutes Missed Time Reason: Patient unwilling/refused to participate without medical reason   Short Term Goals: Week 2:  OT Short Term Goal 1 (Week 2): Pt will bathe 11/11 body parts with min VC for recalling positioning/hemi strategies to demo improved sequencing and R attention OT Short Term Goal 2 (Week 2): Pt will thread R extremities into clothing with no more than min question cues OT Short Term Goal 3 (Week 2): Pt will locate grooming items on R of sink wiht no VC but increased time OT Short Term Goal 4 (Week 2): Pt will sit to stand wiht MIN A in prep for CM  Skilled Therapeutic Interventions/Progress Updates:    Pt received supine with RN present supervising lunch. OT took over supervision as pt consumed magic cup. Assist required to stabilize cup as pt scooped with her L hand. Pt declined any further participation, waving her hand at OT and not making eye contact. Pt did agree to OT placing saebo on her R shoulder to assist with joint approximation and increase muscle activation. Pt able to tolerate 60 min of saebo with her grandson present, edu on how to remove should pt request that. Pt was left supine with all needs met, bed alarm set.   Saebo Stim One 330 pulse width 35 Hz pulse rate On 8 sec/ off 8 sec Ramp up/ down 2 sec Symmetrical Biphasic wave form  Max intensity 181m at 500 Ohm load  Therapy Documentation Precautions:  Precautions Precautions: Fall Precaution Comments: R neglect, R hemi Restrictions Weight Bearing Restrictions: No  Therapy/Group: Individual Therapy  SCurtis Sites8/07/2020, 6:51 AM

## 2020-05-12 NOTE — Progress Notes (Signed)
Physical Therapy Session Note  Patient Details  Name: Lindsey [name] MRN: [id number] Date of Birth: 1956/02/05  Today's Date: 05/12/2020 PT Individual Time: 1030-1115 PT Individual Time Calculation (min): 45 min   Short Term Goals: Week 2:  PT Short Term Goal 1 (Week 2): Patient will perform bed mobility with min A. PT Short Term Goal 2 (Week 2): Patient will perform basic transfers with min A. PT Short Term Goal 3 (Week 2): Patient will ambulate 50 feet with max A and LRAD. PT Short Term Goal 4 (Week 2): Patient will perform standing balance >5 min with min A with L UE support.  Skilled Therapeutic Interventions/Progress Updates:     Patient in w/c in the room upon PT arrival. Patient alert and agreeable to PT session. Patient denied pain during session.  Therapeutic Activity: Bed Mobility: Patient performed sitting balance with supervision as PT doffed tennis shoes EOB with total A. She performed sit to supine with mod-max A for trunk and LE support due to fatigue at end of session and total A for scooting up in bed x2 with bed in trendelenburg. Provided verbal cues for bringing knees to chest to assist with lifting LEs into the bed. Transfers: Patient performed sit to/from stand x3 in the // bars with min A with PT blocking R knee. Provided verbal cues and facilitation for foot placement and forward weight shift.  Wheelchair Mobility:  Patient propelled wheelchair >150 feet with min A for steering due to veering R and failure to avoid objects on the R. Provided HOH assist for L hand to find spokes of the w/c to propel x4 and manual facilitation for stepping laterally with L foot to reduce veering R.   Neuromuscular Re-ed: Patient performed the following standing and sitting UE and LE motor control activities: -standing in // bars 2x2-3 min performed R knee extension progressing to mini squats with improved consistent R quad activation with L side involvement, provided a mirror in front  for visual feedback, and PNF tapping to quad throughout for improved muscle activation.  -standing in // bars focused on R hand grip on bar and activation of triceps for functional UE support in standing x45 sec with trace hand and triceps activation -kicking a ball with R LE in a seated position in standard sock for reduced friction of her foot on the floor   Patient very quiet and slumped in w/c following NMR. Reports that she is feeling okay, but holding her head in her hands. Transferred patient to ortho gym for a quiet environment. Patient denied pain, headache, or dizziness, and confirmed increased fatigue due to only 30 min break between therapy sessions. Discussed spacing out therapies with scheduling at end of session. Patient requested to go back to her room to lay down. Once in her room patient stated that she wanted to go home. PT educated patient on purpose of rehab and therapies and current deficits limiting d/c at this time. Patient continued to insist upon going home. Comforted patient and offered to discuss barriers to comfort or d/c and patient declined, asking to go to bed. Returned patient to bed, as above. Once in bed patient declined any further participation in bed level activities or discussion of POC/recovery. Patient requested to rest at this time. Patient missed 15 min of skilled PT due to fatigue, RN made aware. Will attempt to make-up missed time as able.    Patient in bed at end of session with breaks locked, bed alarm set, and all  needs within reach.    Therapy Documentation Precautions:  Precautions Precautions: Fall Precaution Comments: R neglect, R hemi Restrictions Weight Bearing Restrictions: No General: PT Amount of Missed Time (min): 15 Minutes PT Missed Treatment Reason: Patient fatigue    Therapy/Group: Individual Therapy  Jabree Rebert L Zerina Hallinan PT, DPT  05/12/2020, 3:16 PM

## 2020-05-12 NOTE — Patient Care Conference (Signed)
Inpatient RehabilitationTeam Conference and Plan of Care Update Date: 05/12/2020   Time: 10:07 AM    Patient Name: Lindsey [name]      Medical Record Number: [id number]  Date of Birth: 1955-10-14 Sex: Female         Room/Bed: 4W23C/4W23C-01 Payor Info: Payor: /    Admit Date/Time:  04/30/2020  3:45 PM  Primary Diagnosis:  Right hemiplegia [facility])  Hospital Problems: Principal Problem:   Right hemiplegia (HCC) Active Problems:   Acute ischemic left MCA stroke [facility])    Expected Discharge Date: Expected Discharge Date: 05/29/20  Team Members Present: Physician leading conference: Dr. Faith Rogue Care Coodinator Present: Cecile Sheerer, LCSWA;Numa Schroeter Marlyne Beards, RN, BSN, CRRN Nurse Present: Other (comment) Dava Najjar McGloster, RN) PT Present: Serina Cowper, PT OT Present: Jake Shark, OT SLP Present: Suzzette Righter, CF-SLP PPS Coordinator present : Edson Snowball, Park Breed, SLP     Current Status/Progress Goal Weekly Team Focus  Bowel/Bladder   continent and incontinent at times  Pt will remain continent  Pt will be toileted q 2 hours while awake ad needed.   Swallow/Nutrition/ Hydration   Dys 2 textures, thin liquids, Min A  Supervision  tolerance current diet, independence use of swallow stratgies, Dys 3 trials   ADL's   Mod A UB ADLs, mod A LB dressing, mod A sit <> stand, mod A squat pivot. Brunnstrom 1 RUE  min A - (S)  ADL retraining, ADL transfers, RUE NMR, functional activity tolerance   Mobility   Mod A overall, gait 30 ft with L rail with assist for R limb advancement  Min-mod A overall, gait 50 with functional AD  Activity tolerance, R UE/LE NMR, R attention, functional mobility, gait training, w/c mobility, verbal communication, patient/caregiver education   Communication   Mod-Max A  Supervision-Min A  following 1 step commands, verbal expression word level, naming   Safety/Cognition/ Behavioral Observations  Min A  Supervision  sustained attention,  right visual scanning   Pain   No c/o pain on this shift  Pt will remain pain free while at the hospital  Assess pt for pain q shift or as needed   Skin   Intact rash or skin breakdown noted.  Skin will remain intact, will prevent rash or breakdown  Assess breakdown q shift or as needed.     Discharge Planning:  Pt to dc home with daughter, daughter to provide care at dc.   Team Discussion: Pt continent with incontinent episodes. Pt consumed 45% of breakfast. Daughter still finding it difficult to adjust to pt's condition. Right shoulder has some active movement in AM but none in PM, min/mod assist goals, pt is mod assist for trunk support. Pt does show improved activity tolerance. Sit to stands have improved. Communication is slow to progress. Patient on target to meet rehab goals: yes  *See Care Plan and progress notes for long and short-term goals.   Revisions to Treatment Plan:  none  Teaching Needs: Family education/training to begin next week.  Current Barriers to Discharge: Incontinence and Nutritional means  Possible Resolutions to Barriers: MD adding Remeron for appetite/mood. Time toilet pt q 2hr.     Medical Summary Current Status: aphasia, dense right hemiparesis. po intake lagging. CT with ongoing hemorrhage  Barriers to Discharge: Medical stability   Possible Resolutions to Barriers/Weekly Focus: remeron for appetite/mood. bp mgt, neuro discussion re: a/c resumption   Continued Need for Acute Rehabilitation Level of Care: The patient requires daily medical management by a physician  with specialized training in physical medicine and rehabilitation for the following reasons: Direction of a multidisciplinary physical rehabilitation program to maximize functional independence : Yes Medical management of patient stability for increased activity during participation in an intensive rehabilitation regime.: Yes Analysis of laboratory values and/or radiology reports with  any subsequent need for medication adjustment and/or medical intervention. : Yes   I attest that I was present, lead the team conference, and concur with the assessment and plan of the team.   Tennis Must 05/12/2020, 2:20 PM

## 2020-05-12 NOTE — Progress Notes (Signed)
Physical Therapy Session Note  Patient Details  Name: Lindsey [name] MRN: [id number] Date of Birth: 1955-11-20  Today's Date: 05/12/2020 PT Individual Time: 0936-1003 PT Individual Time Calculation (min): 27 min   Short Term Goals: Week 2:  PT Short Term Goal 1 (Week 2): Patient will perform bed mobility with min A. PT Short Term Goal 2 (Week 2): Patient will perform basic transfers with min A. PT Short Term Goal 3 (Week 2): Patient will ambulate 50 feet with max A and LRAD. PT Short Term Goal 4 (Week 2): Patient will perform standing balance >5 min with min A with L UE support.  Skilled Therapeutic Interventions/Progress Updates:    Pt received supine in bed an initially upon seeing therapist pt shaking head "no" but with minimal encouragement agreeable to therapy session. Supine>sitting R EOB, HOB partially elevated, with mod assist for trunk upright and pivoting hips. Sitting EOB assisted with donning bra, shirt, pants, and shoes max assist - supervision for sitting balance. Sit<>stands to/from elevated EOB (had to elevate bed to allow pt to come to stand) with mod assist for lifting and min/mod assist for balance while pullings pants up over hips. L squat/stand pivot to w/c using armrest for support with mod assist.  Transported to/from gym in w/c for time management and energy conservation. R squat pivot w/c>EOM with mod assist for lifting/pivoting hips and manual facilitation for R LE weightbearing. Sit<>stands to/from EOM x5reps with mod assist and manual facilitation for R LE WBing - cuing for L hand placement on R knee to promote R weightshift. Standing R LE NMR for stance control via L LE stepping forward/backwards to/from external target - demos very small L LE steps - mod assist for balance and blocking R knee buckle but with cuing able to activate quads - 1x R knee hyperextension. Sitting EOM pt able to perform x8 reps hamstring curls using washcloth on floor to minimize friction. L squat  pivot EOM>w/c with max assist for lifting/pivoting hips - anticipate requiring increased assist due to fatigue or impaired motor planning. Transported back to room and left sitting in w/c with needs in reach, seat belt alarm on, and R UE supported on 1/2 lap tray.   Therapy Documentation Precautions:  Precautions Precautions: Fall Precaution Comments: R neglect, R hemi Restrictions Weight Bearing Restrictions: No  Pain:   No indications of pain throughout session.   Therapy/Group: Individual Therapy  Ginny Forth , PT, DPT, CSRS  05/12/2020, 7:58 AM

## 2020-05-12 NOTE — Progress Notes (Signed)
[place] PHYSICAL MEDICINE & REHABILITATION PROGRESS NOTE   Subjective/Complaints:  Sitting in bed. No new problems. SLP at bedside.   ROS: limited due to language/communication    Objective:   CT HEAD WO CONTRAST  Result Date: 05/11/2020 CLINICAL DATA:  Stroke, follow-up. EXAM: CT HEAD WITHOUT CONTRAST TECHNIQUE: Contiguous axial images were obtained from the base of the skull through the vertex without intravenous contrast. COMPARISON:  Prior head CT examinations 05/04/2020 and earlier, brain MRI 04/21/2020 FINDINGS: Brain: A subacute ischemic infarct within the left MCA vascular territory has not significantly changed in extent. Persistent although decreased multifocal petechial hemorrhage within the infarction territory. Persistent mild mass effect with trace 1-2 mm rightward midline shift. No new intracranial hemorrhage is demonstrated. No interval demarcated infarct is identified. No extra-axial fluid collection. No evidence of intracranial mass. Vascular: No new finding. Skull: Normal. Negative for fracture or focal lesion. Sinuses/Orbits: Visualized orbits show no acute finding. Partial opacification of a right ethmoid air cell. No significant mastoid effusion IMPRESSION: Evolving subacute left MCA territory infarct, unchanged in extent. Persistent although decreased mild petechial hemorrhage. 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 Persistent mild mass effect with trace 1-2 mm rightward midline shift. Electronically Signed   By: Jackey Loge DO   On: 05/11/2020 07:32   Recent Labs    05/11/20 0445  WBC 4.3  HGB 11.7*  HCT 34.8*  PLT 455*   Recent Labs    05/11/20 0445  NA 135  K 4.2  CL 101  CO2 22  GLUCOSE 112*  BUN 11  CREATININE 0.56  CALCIUM 9.4    Intake/Output Summary (Last 24 hours) at 05/12/2020 1227 Last data filed at 05/11/2020 1700 Gross per 24 hour  Intake 342 ml  Output --  Net 342 ml     Physical Exam: Vital Signs Blood pressure (!)  130/96, pulse 76, temperature 98.4 F (36.9 C), temperature source Oral, resp. rate 18, height 5\' 5"  (1.651 m), weight 74.6 kg, SpO2 100 %.  Constitutional: No distress . Vital signs reviewed. HEENT: EOMI, oral membranes moist Neck: supple Cardiovascular: IRR without murmur. No JVD    Respiratory/Chest: CTA Bilaterally without wheezes or rales. Normal effort    GI/Abdomen: BS +, non-tender, non-distended Ext: no clubbing, cyanosis, or edema Psych: pleasant and smilin Musculoskeletal: Full range of motion in all 4 extremities. No joint swelling  Neuro: right central 7 and tongue deviaiton.  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 Occasionally garbled speech.  Right sided hemiplegia 0/5 RUE and tr/5 prox to 0/5 distally RLE. Left side 5/5-- No resting tone    Assessment/Plan: 1. 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  Physiatrist is providing close team supervision and 24 hour management of active medical problems listed below.  Physiatrist and rehab team continue to assess barriers to discharge/monitor patient progress toward functional and medical goals  Care Tool:  Bathing    Body parts bathed by patient: Chest, Abdomen, Front perineal area, Left upper leg, Face, Right upper leg, Right lower leg   Body parts bathed by helper: Right arm, Left arm, Buttocks, Right lower leg, Left lower leg     Bathing assist Assist Level: Moderate Assistance - Patient 50 - 74%     Upper Body Dressing/Undressing Upper body dressing   What is the patient wearing?: Pull over shirt    Upper body assist Assist Level: Moderate Assistance -  Patient 50 - 74%    Lower Body Dressing/Undressing Lower body dressing      What is the patient wearing?: Incontinence brief, Pants     Lower body assist Assist for lower body dressing: Maximal  Assistance - Patient 25 - 49%     Toileting Toileting    Toileting assist Assist for toileting: Total Assistance - Patient < 25%     Transfers Chair/bed transfer  Transfers assist  Chair/bed transfer activity did not occur: Safety/medical concerns  Chair/bed transfer assist level: Minimal Assistance - Patient > 75%     Locomotion Ambulation   Ambulation assist   Ambulation activity did not occur: Safety/medical concerns  Assist level: Moderate Assistance - Patient 50 - 74% Assistive device: Other (comment) (L rail) Max distance: 30 ft   Walk 10 feet activity   Assist  Walk 10 feet activity did not occur: Safety/medical concerns  Assist level: Moderate Assistance - Patient - 50 - 74% Assistive device: Other (comment) (L rail, DF wrap with eversion, knee cage)   Walk 50 feet activity   Assist Walk 50 feet with 2 turns activity did not occur: Safety/medical concerns         Walk 150 feet activity   Assist Walk 150 feet activity did not occur: Safety/medical concerns         Walk 10 feet on uneven surface  activity   Assist Walk 10 feet on uneven surfaces activity did not occur: Safety/medical concerns         Wheelchair     Assist Will patient use wheelchair at discharge?: Yes Type of Wheelchair: Manual    Wheelchair assist level: Moderate Assistance - Patient 50 - 74% Max wheelchair distance: 150 ft    Wheelchair 50 feet with 2 turns activity    Assist        Assist Level: Moderate Assistance - Patient 50 - 74%   Wheelchair 150 feet activity     Assist      Assist Level: Moderate Assistance - Patient 50 - 74%   Blood pressure (!) 130/96, pulse 76, temperature 98.4 F (36.9 C), temperature source Oral, resp. rate 18, height 5\' 5"  (1.651 m), weight 74.6 kg, SpO2 100 %.    Medical Problem List and Plan: 1.  R hemiplegia and impaired function/expressive aphasia and R inattention secondary to L MCA stroke with  hemorrhagic extension- with dysphagia- on D2 with thins                -patient may  shower             -ELOS/Goals: 8/27, goals Mod I to min A  -Continue CIR  -8/10 I reviewed her 8/9 HCT with Dr. 10/9. Even though there is persistent blood, her CT is stable to improved. She is 3+ weeks out from stroke and anticoagulation is now recommended for prophylaxis given A-fib   -spoke with daughter today re: plan, prognosis, etc 2.  Antithrombotics: -DVT/anticoagulation:  dc lovenox---starting eliquis per discussion with neurology             -antiplatelet therapy:  d/c ASA (starting eliquis)    3. Pain Management: Tylenol prn. Well controlled 4. Mood: LCSW to follow for evaluation and support.              -antipsychotic agents: N/a 5. Neuropsych: This patient is not fully capable of making decisions on her  own behalf. 6. Skin/Wound Care: Routine pressure relief measures. IVs may be removed. 7. Fluids/Electrolytes/Nutrition:  Monitor I/O. Na is 135 8/9  -prealbumin only 16.3   8/9   -added remeron at HS for appetite  -eating a bit better over the last 24 hours 8. A fib with RVR: Monitor HR bid--continue metoprolol 75 mg bid.   -HR in 90's  - eliquis   9. HTN: Monitor BP tid.  On Lopressor 75 mg bid  8/2: diastolic continues to be elevated. Added amlodipine 5mg .   8/6 continue DBP elevation. Increase amlodipine to 10mg  Vitals:   05/11/20 1930 05/12/20 0455  BP: 105/69 (!) 130/96  Pulse: 80 76  Resp: 17 18  Temp: 98.6 F (37 C) 98.4 F (36.9 C)  SpO2: 100% 100%  BPs somewhat controlled 8/10, still watching DBP 10 Dysphagia: On Dysphagia 2, thin liquids. Encourage intake.   -family can bring in food from home with in dietary restrictions.  11. ETOH abuse: Has not had issues with withdrawal.  12. Prediabetes: Hgb A1c-6.1.  CBG (last 3)  No results for input(s): GLUCAP in the last 72 hours.  CBG all normal no insulin needed, d/c'ed CBG and SSI  13. ABLA: Hgb 12.5 on 8/2.   LOS: 12  days A FACE TO FACE EVALUATION WAS PERFORMED  07/11/20 05/12/2020, 12:27 PM

## 2020-05-12 NOTE — Progress Notes (Signed)
Speech Language Pathology Daily Session Note  Patient Details  Name: Lindsey [name] MRN: [id number] Date of Birth: Jul 02, 1956  Today's Date: 05/12/2020 SLP Individual Time: 0815-0900 SLP Individual Time Calculation (min): 45 min  Short Term Goals: Week 2: SLP Short Term Goal 1 (Week 2): Patient will consume current diet with minimal overt s/s of aspiration and overall supervision level verbal and visual cues for use of swallowing compensatory strategies. SLP Short Term Goal 2 (Week 2): Patient will attend/scan to right visual field in 75% of opportunities with Min A verbal and visual cues. SLP Short Term Goal 3 (Week 2): Patient will answer mildly abstract yes/no questions with 75% accuracy and Min A verbal cues. SLP Short Term Goal 4 (Week 2): Patient will produce simple labial/vowel CV and CVC combinations with Min A multimodal cues and 75% accuracy SLP Short Term Goal 5 (Week 2): Patient will express basic wants/needs with use of multimodal communication with Mod A multimodal cues.  Skilled Therapeutic Interventions: Skilled therapeutic intervention focused on language. Automatic speech tasks completed with min A for counting 1-10 and days of the week and Max A for months of the year. Pt able to produce simple cvc expressions with Mod A using verbal carrier phrases for "hi" "bye" and "pie". Mod A with visual cues using gestures and verbal cues needed to answer simple yes/no questions in context and with biographical information. Pt was able to state family members names with min A verbal cues  when shown photograph. Pt continues to demonstrate decreased awareness of errors in response to simple questions and requires repetition of information or visual cues to increase understanding. Pt left upright in bed with bed alarm on, and call button within reach. Cont with therapy goals per plan of care.      Pain Pain Assessment Pain Scale: Faces Faces Pain Scale: No hurt  Therapy/Group: Individual  Therapy  Carlean Jews Aemon Koeller 05/12/2020, 10:24 AM

## 2020-05-13 ENCOUNTER — Inpatient Hospital Stay (HOSPITAL_COMMUNITY): Payer: Self-pay

## 2020-05-13 ENCOUNTER — Inpatient Hospital Stay (HOSPITAL_COMMUNITY): Payer: Self-pay | Admitting: Physical Therapy

## 2020-05-13 NOTE — Progress Notes (Signed)
Occupational Therapy Session Note  Patient Details  Name: Macaiah Mangal MRN: [id number] Date of Birth: 1955/12/19  Today's Date: 05/13/2020 OT Individual Time: 1406-1500 OT Individual Time Calculation (min): 54 min    Short Term Goals: Week 2:  OT Short Term Goal 1 (Week 2): Pt will bathe 11/11 body parts with min VC for recalling positioning/hemi strategies to demo improved sequencing and R attention OT Short Term Goal 2 (Week 2): Pt will thread R extremities into clothing with no more than min question cues OT Short Term Goal 3 (Week 2): Pt will locate grooming items on R of sink wiht no VC but increased time OT Short Term Goal 4 (Week 2): Pt will sit to stand wiht MIN A in prep for CM  Skilled Therapeutic Interventions/Progress Updates:    Pt received sitting in w/c with sister present, requesting to take shower. No c/o pain. Pt completed stand pivot transfer into the shower with AFO on RLE, with mod A! From seated pt completed UB bathing with mod A and min A for LB bathing using lateral leans to reach peri areas. Facilitation for RUE HOH to wash LUE. CGA required for forward trunk flexion to wash distal BLE. Pt completed squat pivot transfer from the TTB to the w/c with max A. Both transfers toward the strong L side. Pt completed oral care at the sink with set up assist. Pt donned shirt with max A. Mod A to don brief. Pt completed sit > stand at the sink x2 with mod A. Blocking at the R LE required. Pt requested to return to bed. She completed stand pivot toward the R side with max A. Mod A to lift LE into bed. Mellissa Kohut was briefly applied to pt's R forearm extensors but then Once supine NT entered for bladder scan. Pt agreeable to transfer to toilet after bladder scan revealed high residual. Stedy completed for transfer, with pt standing with min A. Pt voided and was returned to bed, NT assisting.  Saebo Stim One 330 pulse width 35 Hz pulse rate On 8 sec/ off 8 sec Ramp up/ down 2  sec Symmetrical Biphasic wave form  Max intensity at 500 Ohm load   Therapy Documentation Precautions:  Precautions Precautions: Fall Precaution Comments: R neglect, R hemi Restrictions Weight Bearing Restrictions: No   Therapy/Group: Individual Therapy  Crissie Reese 05/13/2020, 6:52 AM

## 2020-05-13 NOTE — Progress Notes (Signed)
Physical Therapy Session Note  Patient Details  Name: Lindsey [name] MRN: [id number] Date of Birth: 18-Apr-1956  Today's Date: 05/13/2020 PT Individual Time: 7253-6644 PT Individual Time Calculation (min): 54 min   Short Term Goals: Week 2:  PT Short Term Goal 1 (Week 2): Patient will perform bed mobility with min A. PT Short Term Goal 2 (Week 2): Patient will perform basic transfers with min A. PT Short Term Goal 3 (Week 2): Patient will ambulate 50 feet with max A and LRAD. PT Short Term Goal 4 (Week 2): Patient will perform standing balance >5 min with min A with L UE support.  Skilled Therapeutic Interventions/Progress Updates:     Patient in bed upon PT arrival. Patient alert and agreeable to PT session. Patient denied pain during session.  Therapeutic Activity: Bed Mobility: Patient performed rolling R with supervision and L with min A with use of bed rails. Donned pants while rolling with max-mod A with patient using L UE to pull her pants up bed level. She performed supine to/from sit with min A for trunk management to come to sitting and R LE to lie down. Provided verbal cues for use of L LE to assist her R LE off the bed. Donned B socks, tennis shoes, and R GRFO with patient sitting EOB with supervision for sitting balance. Transfers: Patient performed sit to/from stand x3 with min A using a LE rail x1 and RW with R hand splint x2. Provided verbal cues with [facility] for L hand placement in the hand splint, pushing up and reaching back with her L UE, and forward weight shift. Patient able to stabilize her R knee without buckling or hyperextension today, required cues for quad activation and R weight shift to equal weight bearing in standing.   Gait Training:  Patient ambulated 30 feet using L rail with mod A and 10 feet using RW with R hand splint with max A. Assist for weight shift and R LE advancement and foot placement, and RW managment. Ambulated with step-to gait pattern leading with R,  increased R knee flexion in stance, delayed initiation for R limb advancement, and decreased R weight shift. Provided verbal cues for erect posture, R quad and gluteal activation in stance, and initiation on R swing phase. Patient with increased difficulty sequencing and and advancing R LE with RW, will continue gait training with and without the RW.   Wheelchair Mobility:  Patient was transported in the w/c with total A throughout session for energy conservation and time management.  Neuromuscular Re-ed: Patient performed the following LE motor control activities focused on activation of R LE: -bridging 2x5, provided facilitation at R knee and blocking of R foot for stability -supine hip abduction/adduction 2x5 -heel slides 2x5, able to go through ~25% of range without assist and 50% assist for remainder of range -R ankle pumps 2x5 progressing from PROM to AROM  Patient in bed at end of session with breaks locked, bed alarm set, and all needs within reach. Patient in better spirits and with improved activity tolerance throughout session.   Therapy Documentation Precautions:  Precautions Precautions: Fall Precaution Comments: R neglect, R hemi Restrictions Weight Bearing Restrictions: No   Therapy/Group: Individual Therapy  Hadas Jessop L Jaston Havens PT, DPT  05/13/2020, 12:56 PM

## 2020-05-13 NOTE — Progress Notes (Signed)
Speech Language Pathology Daily Session Note  Patient Details  Name: Lindsey [name] MRN: [id number] Date of Birth: [birth year]  Today's Date: 05/13/2020 SLP Individual Time: 1305-1400 SLP Individual Time Calculation (min): 55 min  Short Term Goals: Week 2: SLP Short Term Goal 1 (Week 2): Patient will consume current diet with minimal overt s/s of aspiration and overall supervision level verbal and visual cues for use of swallowing compensatory strategies. SLP Short Term Goal 2 (Week 2): Patient will attend/scan to right visual field in 75% of opportunities with Min A verbal and visual cues. SLP Short Term Goal 3 (Week 2): Patient will answer mildly abstract yes/no questions with 75% accuracy and Min A verbal cues. SLP Short Term Goal 4 (Week 2): Patient will produce simple labial/vowel CV and CVC combinations with Min A multimodal cues and 75% accuracy SLP Short Term Goal 5 (Week 2): Patient will express basic wants/needs with use of multimodal communication with Mod A multimodal cues.  Skilled Therapeutic Interventions: Skilled ST services focused on swallow and language skills. SLP facilitated PO consumption of dys 2 textures and thin liquids via cup lunch tray, pt demonstrated mod A right buccal pocketing, further impacted by missing dentition with ability to clear when cued for lingual sweeps. SLP noted x1 immediate cough when pt attempted to consume thin liquids via cup with solid residue remaining in oral cavity. SLP provided education to separate liquids and solids. No further s/s aspiration noted. SLP recommends remaining on current diet. SLP targeted speech production of labial sounds /b, m/ in CV and CVC format. Pt demonstrated ability to produce 5 out 13 presented /b/ speech sounds in CV and CVC format given picture cards and then produced 4 out 5 correctly again on second trial, mainly in CV format with max A sentence completion cues. Pt was able to produce 6 out 8 presented /m/ CV speech  sounds with max A sentence completion cues. Pt was also able to write her last name independently and first name given the initial letter fading to dictation of first letter. Pt was left in room with call bell within reach and chair alarm set. ST recommends to continue skilled ST services.      Pain Pain Assessment Pain Score: 0-No pain  Therapy/Group: Individual Therapy  Konnor Jorden  [facility] 05/13/2020, 4:04 PM

## 2020-05-13 NOTE — Progress Notes (Signed)
Physical Therapy Session Note  Patient Details  Name: Lindsey [name] MRN: [id number] Date of Birth: August 30, 1956  Today's Date: 05/13/2020 PT Individual Time: 1135-1205 PT Individual Time Calculation (min): 30 min   Short Term Goals: Week 2:  PT Short Term Goal 1 (Week 2): Patient will perform bed mobility with min A. PT Short Term Goal 2 (Week 2): Patient will perform basic transfers with min A. PT Short Term Goal 3 (Week 2): Patient will ambulate 50 feet with max A and LRAD. PT Short Term Goal 4 (Week 2): Patient will perform standing balance >5 min with min A with L UE support.  Skilled Therapeutic Interventions/Progress Updates: Pt presented in bed agreeable to therapy. Pt denies pain during session. Performed supine to sit with modA and use of bed features. Performed squat pivot transfer to w/c with modA and cues for sequencing. Pt transported to day room and participated in stand pivot transfers without AD. Pt required max cues for scooting to edge of surface, hand placement and PTA present to block R knee upon standing. With PTA blocking R knee and facilitation of wt shifting to R pt able to step with LLE with max encouragement and complete stand pivot with modA. Pt also performed STS at mat x 3 with PTA blocking R knee and pt performed lateral wt shifting in preparation for additional stand pivot. Performed stand pivot to w/c with pt able to clearly take step with LLE with PTA facilitating wt shfiting to R and blocking R knee, transfer performed with modA overall. Pt transported back to room at end of session and left with belt alarm on, and pt at sink to allow sister to clean hair.      Therapy Documentation Precautions:  Precautions Precautions: Fall Precaution Comments: R neglect, R hemi Restrictions Weight Bearing Restrictions: No General:   Vital Signs: Therapy Vitals Temp: 97.7 F (36.5 C) Temp Source: Oral Pulse Rate: 66 Resp: 18 BP: 98/70 Patient Position (if  appropriate): Sitting Oxygen Therapy O2 Device: Room Air Pain: Pain Assessment Pain Score: 0-No pain    Therapy/Group: Individual Therapy  Johnpaul Gillentine  Leandria Thier, PTA  05/13/2020, 4:15 PM

## 2020-05-13 NOTE — Plan of Care (Signed)
  Problem: Consults Goal: RH STROKE PATIENT EDUCATION Description: See Patient Education module for education specifics  Outcome: Progressing Goal: Nutrition Consult-if indicated Outcome: Progressing Goal: Diabetes Guidelines if Diabetic/Glucose > 140 Description: If diabetic or lab glucose is > 140 mg/dl - Initiate Diabetes/Hyperglycemia Guidelines & Document Interventions  Outcome: Progressing   Problem: RH BOWEL ELIMINATION Goal: RH STG MANAGE BOWEL WITH ASSISTANCE Description: STG Manage Bowel with Assistance. Outcome: Progressing Goal: RH STG MANAGE BOWEL W/MEDICATION W/ASSISTANCE Description: STG Manage Bowel with Medication with Assistance. Outcome: Progressing   Problem: RH BLADDER ELIMINATION Goal: RH STG MANAGE BLADDER WITH ASSISTANCE Description: STG Manage Bladder With Assistance Outcome: Progressing Goal: RH STG MANAGE BLADDER WITH MEDICATION WITH ASSISTANCE Description: STG Manage Bladder With Medication With Assistance. Outcome: Progressing Goal: RH STG MANAGE BLADDER WITH EQUIPMENT WITH ASSISTANCE Description: STG Manage Bladder With Equipment With Assistance Outcome: Progressing   Problem: RH SKIN INTEGRITY Goal: RH STG SKIN FREE OF INFECTION/BREAKDOWN Outcome: Progressing Goal: RH STG MAINTAIN SKIN INTEGRITY WITH ASSISTANCE Description: STG Maintain Skin Integrity With Assistance. Outcome: Progressing Goal: RH STG ABLE TO PERFORM INCISION/WOUND CARE W/ASSISTANCE Description: STG Able To Perform Incision/Wound Care With Assistance. Outcome: Progressing   Problem: RH SAFETY Goal: RH STG ADHERE TO SAFETY PRECAUTIONS W/ASSISTANCE/DEVICE Description: STG Adhere to Safety Precautions With Assistance/Device. Outcome: Progressing Goal: RH STG DECREASED RISK OF FALL WITH ASSISTANCE Description: STG Decreased Risk of Fall With Assistance. Outcome: Progressing   Problem: RH COGNITION-NURSING Goal: RH STG USES MEMORY AIDS/STRATEGIES W/ASSIST TO PROBLEM  SOLVE Description: STG Uses Memory Aids/Strategies With Assistance to Problem Solve. Outcome: Progressing Goal: RH STG ANTICIPATES NEEDS/CALLS FOR ASSIST W/ASSIST/CUES Description: STG Anticipates Needs/Calls for Assist With Assistance/Cues. Outcome: Progressing   Problem: RH PAIN MANAGEMENT Goal: RH STG PAIN MANAGED AT OR BELOW PT'S PAIN GOAL Outcome: Progressing   Problem: RH KNOWLEDGE DEFICIT Goal: RH STG INCREASE KNOWLEDGE OF DIABETES Outcome: Progressing Goal: RH STG INCREASE KNOWLEDGE OF HYPERTENSION Outcome: Progressing Goal: RH STG INCREASE KNOWLEDGE OF DYSPHAGIA/FLUID INTAKE Outcome: Progressing Goal: RH STG INCREASE KNOWLEGDE OF HYPERLIPIDEMIA Outcome: Progressing Goal: RH STG INCREASE KNOWLEDGE OF STROKE PROPHYLAXIS Outcome: Progressing   Problem: RH Vision Goal: RH LTG Vision (Specify) Outcome: Progressing

## 2020-05-14 ENCOUNTER — Inpatient Hospital Stay (HOSPITAL_COMMUNITY): Payer: Self-pay

## 2020-05-14 ENCOUNTER — Inpatient Hospital Stay (HOSPITAL_COMMUNITY): Payer: Self-pay | Admitting: Speech Pathology

## 2020-05-14 NOTE — Progress Notes (Signed)
[facility] PHYSICAL MEDICINE & REHABILITATION PROGRESS NOTE   Subjective/Complaints:  Just returned from PT. Indicates that right arm is sore. Seems to be sensitive to touch in forearm/hand. Indicates that it is hypersensitive to touch  ROS: limited due to language/communication   Objective:   No results found. No results for input(s): WBC, HGB, HCT, PLT in the last 72 hours. No results for input(s): NA, K, CL, CO2, GLUCOSE, BUN, CREATININE, CALCIUM in the last 72 hours.  Intake/Output Summary (Last 24 hours) at 05/14/2020 1019 Last data filed at 05/14/2020 0944 Gross per 24 hour  Intake 377 ml  Output 500 ml  Net -123 ml     Physical Exam: Vital Signs Blood pressure 134/81, pulse 60, temperature 98.2 F (36.8 C), temperature source Oral, resp. rate 16, height 5\' 5"  (1.651 m), weight 74.4 kg, SpO2 100 %.  Constitutional: No distress . Vital signs reviewed. HEENT: EOMI, oral membranes moist Neck: supple Cardiovascular: RRR without murmur. No JVD    Respiratory/Chest: CTA Bilaterally without wheezes or rales. Normal effort    GI/Abdomen: BS +, non-tender, non-distended Ext: no clubbing, cyanosis, or edema Psych: pleasant and cooperative Musculoskeletal: Full range of motion in all 4 extremities. No joint swelling Right arm sensitive to palpation. 1/2" sublux at right shoulder Neuro: right central 7 and tongue deviaiton.  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 Improving y/n. Attempting to appropriately verbalize although speech very garbled.  Right sided hemiplegia 0/5 RUE and tr/5 prox to 0/5 distally RLE. Left side 5/5-- sl flexor tone RUE    Assessment/Plan: 1. 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  Physiatrist is providing close team supervision and 24 hour management of active medical problems listed  below.  Physiatrist and rehab team continue to assess barriers to discharge/monitor patient progress toward functional and medical goals  Care Tool:  Bathing    Body parts bathed by patient: Chest, Abdomen, Front perineal area, Left upper leg, Face, Right upper leg, Right lower leg   Body parts bathed by helper: Right arm, Left arm, Buttocks, Right lower leg, Left lower leg     Bathing assist Assist Level: Moderate Assistance - Patient 50 - 74%     Upper Body Dressing/Undressing Upper body dressing   What is the patient wearing?: Pull over shirt    Upper body assist Assist Level: Moderate Assistance - Patient 50 - 74%    Lower Body Dressing/Undressing Lower body dressing      What is the patient wearing?: Incontinence brief, Pants     Lower body assist Assist for lower body dressing: Maximal Assistance - Patient 25 - 49%     Toileting Toileting    Toileting assist Assist for toileting: Total Assistance - Patient < 25%     Transfers Chair/bed transfer  Transfers assist  Chair/bed transfer activity did not occur: Safety/medical concerns  Chair/bed transfer assist level: Moderate Assistance - Patient 50 - 74%     Locomotion Ambulation   Ambulation assist   Ambulation activity did not occur: Safety/medical concerns  Assist level: Maximal Assistance - Patient 25 - 49% Assistive device: Walker-rolling (R hand orthosis) Max distance: 8 ft   Walk 10 feet activity   Assist  Walk 10 feet activity did not occur: Safety/medical concerns  Assist level: Moderate Assistance - Patient - 50 - 74% Assistive device: Other (comment) (L rail, DF wrap with eversion, knee cage)  Walk 50 feet activity   Assist Walk 50 feet with 2 turns activity did not occur: Safety/medical concerns         Walk 150 feet activity   Assist Walk 150 feet activity did not occur: Safety/medical concerns         Walk 10 feet on uneven surface  activity   Assist Walk 10  feet on uneven surfaces activity did not occur: Safety/medical concerns         Wheelchair     Assist Will patient use wheelchair at discharge?: Yes Type of Wheelchair: Manual    Wheelchair assist level: Moderate Assistance - Patient 50 - 74% Max wheelchair distance: 150 ft    Wheelchair 50 feet with 2 turns activity    Assist        Assist Level: Moderate Assistance - Patient 50 - 74%   Wheelchair 150 feet activity     Assist      Assist Level: Moderate Assistance - Patient 50 - 74%   Blood pressure 134/81, pulse 60, temperature 98.2 F (36.8 C), temperature source Oral, resp. rate 16, height 5\' 5"  (1.651 m), weight 74.4 kg, SpO2 100 %.    Medical Problem List and Plan: 1.  R hemiplegia and impaired function/expressive aphasia and R inattention secondary to L MCA stroke with hemorrhagic extension- with dysphagia- on D2 with thins                -patient may  shower             -ELOS/Goals: 8/27, goals Mod I to min A  -Continue CIR  -8/10 I reviewed her 8/9 HCT with Dr. 10/9. Even though there is persistent blood, her CT is stable to improved. She is 3+ weeks out from stroke and anticoagulation is now recommended for prophylaxis given A-fib    -spoke with daughterre: plan, prognosis, etc 2.  Antithrombotics: -DVT/anticoagulation:  dc lovenox---starting eliquis per discussion with neurology             -antiplatelet therapy:  d/c ASA (starting eliquis)    3. Pain Management: Tylenol prn. Well controlled 4. Mood: LCSW to follow for evaluation and support.              -antipsychotic agents: N/a 5. Neuropsych: This patient is not fully capable of making decisions on her  own behalf. 6. Skin/Wound Care: Routine pressure relief measures. IVs may be removed. 7. Fluids/Electrolytes/Nutrition: Monitor I/O. Na is 135 8/9  -prealbumin only 16.3   8/9   -added remeron at HS for appetite  -8/12 po intake inconsistent but improving   -daughter aware of my nutritional  concerns 8. A fib with RVR: Monitor HR bid--continue metoprolol 75 mg bid.   -HR in 90's  - eliquis   9. HTN: Monitor BP tid.  On Lopressor 75 mg bid  8/2: diastolic continues to be elevated. Added amlodipine 5mg .   8/6 continue DBP elevation. Increase amlodipine to 10mg  Vitals:   05/13/20 2048 05/14/20 0309  BP: 109/86 134/81  Pulse: 96 60  Resp:  16  Temp:  98.2 F (36.8 C)  SpO2:  100%  BPs somewhat controlled 8/12 10 Dysphagia: On Dysphagia 2, thin liquids. Encourage intake.   -family can bring in food from home with in dietary restrictions.  11. ETOH abuse: Has not had issues with withdrawal.  12. Prediabetes: Hgb A1c-6.1.   -cbg's have been controlled  13. ABLA: Hgb 12.5 on 8/2.   LOS: 14 days A  FACE TO FACE EVALUATION WAS PERFORMED  Ranelle Oyster 05/14/2020, 10:19 AM

## 2020-05-14 NOTE — Plan of Care (Signed)
  Problem: Consults Goal: RH STROKE PATIENT EDUCATION Description: See Patient Education module for education specifics  Outcome: Progressing Goal: Nutrition Consult-if indicated Outcome: Progressing Goal: Diabetes Guidelines if Diabetic/Glucose > 140 Description: If diabetic or lab glucose is > 140 mg/dl - Initiate Diabetes/Hyperglycemia Guidelines & Document Interventions  Outcome: Progressing   Problem: RH BOWEL ELIMINATION Goal: RH STG MANAGE BOWEL WITH ASSISTANCE Description: STG Manage Bowel with Assistance. Outcome: Progressing Goal: RH STG MANAGE BOWEL W/MEDICATION W/ASSISTANCE Description: STG Manage Bowel with Medication with Assistance. Outcome: Progressing   Problem: RH BLADDER ELIMINATION Goal: RH STG MANAGE BLADDER WITH ASSISTANCE Description: STG Manage Bladder With Assistance Outcome: Progressing Goal: RH STG MANAGE BLADDER WITH MEDICATION WITH ASSISTANCE Description: STG Manage Bladder With Medication With Assistance. Outcome: Progressing Goal: RH STG MANAGE BLADDER WITH EQUIPMENT WITH ASSISTANCE Description: STG Manage Bladder With Equipment With Assistance Outcome: Progressing   Problem: RH SKIN INTEGRITY Goal: RH STG SKIN FREE OF INFECTION/BREAKDOWN Outcome: Progressing Goal: RH STG MAINTAIN SKIN INTEGRITY WITH ASSISTANCE Description: STG Maintain Skin Integrity With Assistance. Outcome: Progressing Goal: RH STG ABLE TO PERFORM INCISION/WOUND CARE W/ASSISTANCE Description: STG Able To Perform Incision/Wound Care With Assistance. Outcome: Progressing   Problem: RH SAFETY Goal: RH STG ADHERE TO SAFETY PRECAUTIONS W/ASSISTANCE/DEVICE Description: STG Adhere to Safety Precautions With Assistance/Device. Outcome: Progressing Goal: RH STG DECREASED RISK OF FALL WITH ASSISTANCE Description: STG Decreased Risk of Fall With Assistance. Outcome: Progressing   Problem: RH COGNITION-NURSING Goal: RH STG USES MEMORY AIDS/STRATEGIES W/ASSIST TO PROBLEM  SOLVE Description: STG Uses Memory Aids/Strategies With Assistance to Problem Solve. Outcome: Progressing Goal: RH STG ANTICIPATES NEEDS/CALLS FOR ASSIST W/ASSIST/CUES Description: STG Anticipates Needs/Calls for Assist With Assistance/Cues. Outcome: Progressing   Problem: RH PAIN MANAGEMENT Goal: RH STG PAIN MANAGED AT OR BELOW PT'S PAIN GOAL Outcome: Progressing   Problem: RH KNOWLEDGE DEFICIT Goal: RH STG INCREASE KNOWLEDGE OF DIABETES Outcome: Progressing Goal: RH STG INCREASE KNOWLEDGE OF HYPERTENSION Outcome: Progressing Goal: RH STG INCREASE KNOWLEDGE OF DYSPHAGIA/FLUID INTAKE Outcome: Progressing Goal: RH STG INCREASE KNOWLEGDE OF HYPERLIPIDEMIA Outcome: Progressing Goal: RH STG INCREASE KNOWLEDGE OF STROKE PROPHYLAXIS Outcome: Progressing   Problem: RH Vision Goal: RH LTG Vision (Specify) Outcome: Progressing   

## 2020-05-14 NOTE — Progress Notes (Addendum)
Occupational Therapy Session Note  Patient Details  Name: Elianah Karis MRN: [id number] Date of Birth: [birth year]  Today's Date: 05/14/2020 OT Individual Time: 1045-1055 OT Individual Time Calculation (min): 10 min    Short Term Goals: Week 2:  OT Short Term Goal 1 (Week 2): Pt will bathe 11/11 body parts with min VC for recalling positioning/hemi strategies to demo improved sequencing and R attention OT Short Term Goal 2 (Week 2): Pt will thread R extremities into clothing with no more than min question cues OT Short Term Goal 3 (Week 2): Pt will locate grooming items on R of sink wiht no VC but increased time OT Short Term Goal 4 (Week 2): Pt will sit to stand wiht MIN A in prep for CM  Skilled Therapeutic Interventions/Progress Updates:    1:1. Pt received in bed asleep bt easily aroused. Pt looking away from OT and rolling eyes then slosing eyes when OT suggests to work on RUE OOB. Pt educated on need to participate in tx to improve functional outcomes, hwoever pt continues to close eyes and turn head away from OT. Pt eventually agreeable to estim placed on R deltoid to aproximate the shoulder joint and decrease risk of subluxation.   Saebo Stim One 60 min 330 pulse width 35 Hz pulse rate On 8 sec/ off 8 sec Ramp up/ down 2 sec Symmetrical Biphasic wave form  Max intensity at 500 Ohm load Skin in tact at enf od 60 min and pt tolerated well.   Pt missed 50 min skilled OT d/t refusal/fatigue  Attempted to see pt 30 min later and pt continues to refuse tx a this time   Therapy Documentation Precautions:  Precautions Precautions: Fall Precaution Comments: R neglect, R hemi Restrictions Weight Bearing Restrictions: No General:   Vital Signs:   Pain: Pain Assessment Pain Scale: 0-10 Pain Score: 0-No pain ADL: ADL Grooming: Moderate assistance Where Assessed-Grooming: Edge of bed Upper Body Bathing: Moderate assistance Where Assessed-Upper Body Bathing:  Shower Lower Body Bathing: Maximal assistance Where Assessed-Lower Body Bathing: Shower Upper Body Dressing: Maximal assistance Where Assessed-Upper Body Dressing: Wheelchair Lower Body Dressing: Dependent Where Assessed-Lower Body Dressing: Wheelchair, Other (Comment) (stedy) Toileting: Dependent (stedy) Where Assessed-Toileting: Bedside Commode Toilet Transfer: Dependent Toilet Transfer Method:  (stedy) Acupuncturist: Drop arm bedside Corporate investment banker: Dependent (stedy) Vision   Perception    Praxis   Exercises:   Other Treatments:     Therapy/Group: Individual Therapy  Shon Hale 05/14/2020, 10:49 AM

## 2020-05-14 NOTE — Discharge Instructions (Addendum)
Inpatient Rehab Discharge Instructions  Lindsey [name] Discharge date and time: 05/29/20   Activities/Precautions/ Functional Status: Activity: no lifting, driving, or strenuous exercise for till cleared by MD Diet: Chopped foods. Wound Care: none needed    Functional status:  ___ No restrictions     ___ Walk up steps independently _X__ 24/7 supervision/assistance   ___ Walk up steps with assistance ___ Intermittent supervision/assistance  ___ Bathe/dress independently ___ Walk with walker     _X__ Bathe/dress with assistance ___ Walk Independently    ___ Shower independently ___ Walk with assistance    ___ Shower with assistance ___ No alcohol     ___ Return to work/school ________  Special Instructions:   REFERRALS UPON DISCHARGE:    Home Health:   PT     OT    ST     SNA                 Agency: [facility]  (charity)  Phone: [phone] *Please expect follow-up to schedule your home visit. If you have not received follow-up, be sure to contact the branch directly.*  Medical Equipment/Items Ordered: wheelchair with right lap tray, drop arm bedside commode, hospital bed,  Transfer board                                                 Agency/Supplier: [employer] [facility] [phone]  GENERAL COMMUNITY RESOURCES FOR PATIENT/FAMILY: Hospital follow-up scheduled with Dr. Mallie Mussel [phone]) on Thursday, September 9 at 10:20am. If you are unable to make this appointment, please be sure to call and reschedule   STROKE/TIA DISCHARGE INSTRUCTIONS SMOKING Cigarette smoking nearly doubles your risk of having a stroke & is the single most alterable risk factor  If you smoke or have smoked in the last 12 months, you are advised to quit smoking for your health.  Most of the excess cardiovascular risk related to smoking disappears within a year of stopping.  Ask you doctor about anti-smoking medications  [facility] Quit Line: 1-800-QUIT NOW  Free Smoking Cessation  Classes [phone]  CHOLESTEROL Know your levels; limit fat & cholesterol in your diet  Lipid Panel     Component Value Date/Time   CHOL 203 (H) 04/22/2020 0338   TRIG 69 04/22/2020 0338   HDL 62 04/22/2020 0338   CHOLHDL 3.3 04/22/2020 0338   VLDL 14 04/22/2020 0338   LDLCALC 127 (H) 04/22/2020 0338      Many patients benefit from treatment even if their cholesterol is at goal.  Goal: Total Cholesterol (CHOL) less than 160  Goal:  Triglycerides (TRIG) less than 150  Goal:  HDL greater than 40  Goal:  LDL (LDLCALC) less than 100   BLOOD PRESSURE American Stroke Association blood pressure target is less that 120/80 mm/Hg  Your discharge blood pressure is:  BP: 114/79  Monitor your blood pressure  Limit your salt and alcohol intake  Many individuals will require more than one medication for high blood pressure  DIABETES (A1c is a blood sugar average for last 3 months) Goal HGBA1c is under 7% (HBGA1c is blood sugar average for last 3 months)  Diabetes: Pre diabetic    Lab Results  Component Value Date   HGBA1C 6.1 (H) 04/22/2020     Your HGBA1c can be lowered with medications, healthy diet, and exercise.  Check  your blood sugar as directed by your physician  Call your physician if you experience unexplained or low blood sugars.  PHYSICAL ACTIVITY/REHABILITATION Goal is 30 minutes at least 4 days per week  Activity: No driving, Therapies: see above Return to work: N/A  Activity decreases your risk of heart attack and stroke and makes your heart stronger.  It helps control your weight and blood pressure; helps you relax and can improve your mood.  Participate in a regular exercise program.  Talk with your doctor about the best form of exercise for you (dancing, walking, swimming, cycling).  DIET/WEIGHT Goal is to maintain a healthy weight  Your discharge diet is:  Diet Order            DIET DYS 2 Room service appropriate? Yes; Fluid consistency: Thin  Diet  effective now                liquids Your height is:  Height: 5\' 5"  (165.1 cm) Your current weight is: Weight: 73.1 kg Your Body Mass Index (BMI) is:  BMI (Calculated): 26.82  Following the type of diet specifically designed for you will help prevent another stroke.  Your goal weight is 150 lbs  Your goal Body Mass Index (BMI) is 19-24.  Healthy food habits can help reduce 3 risk factors for stroke:  High cholesterol, hypertension, and excess weight.  RESOURCES Stroke/Support Group:  Call [phone]   STROKE EDUCATION PROVIDED/REVIEWED AND GIVEN TO PATIENT Stroke warning signs and symptoms How to activate emergency medical system (call 911). Medications prescribed at discharge. Need for follow-up after discharge. Personal risk factors for stroke. Pneumonia vaccine given:  Flu vaccine given:  My questions have been answered, the writing is legible, and I understand these instructions.  I will adhere to these goals & educational materials that have been provided to me after my discharge from the hospital.      My questions have been answered and I understand these instructions. I will adhere to these goals and the provided educational materials after my discharge from the hospital.  Patient/Caregiver Signature _______________________________ Date __________  Clinician Signature _______________________________________ Date __________  Please bring this form and your medication list with you to all your follow-up doctor's appointments. COMMUNITY  Information on my medicine - ELIQUIS (apixaban)  This medication education was reviewed with me or my healthcare representative as part of my discharge preparation.  The pharmacist that spoke with me during my hospital stay was:  [phone], [facility]  Why was Eliquis prescribed for you? Eliquis was prescribed for you to reduce the risk of a blood clot forming that can cause a stroke if you have a medical condition called  atrial fibrillation (a type of irregular heartbeat).  What do You need to know about Eliquis ? Take your Eliquis TWICE DAILY - one tablet in the morning and one tablet in the evening with or without food. If you have difficulty swallowing the tablet whole please discuss with your pharmacist how to take the medication safely.  Take Eliquis exactly as prescribed by your doctor and DO NOT stop taking Eliquis without talking to the doctor who prescribed the medication.  Stopping may increase your risk of developing a stroke.  Refill your prescription before you run out.  After discharge, you should have regular check-up appointments with your healthcare provider that is prescribing your Eliquis.  In the future your dose may need to be changed if your kidney function or weight changes by a significant amount  or as you get older.  What do you do if you miss a dose? If you miss a dose, take it as soon as you remember on the same day and resume taking twice daily.  Do not take more than one dose of ELIQUIS at the same time to make up a missed dose.  Important Safety Information A possible side effect of Eliquis is bleeding. You should call your healthcare provider right away if you experience any of the following: ? Bleeding from an injury or your nose that does not stop. ? Unusual colored urine (red or dark brown) or unusual colored stools (red or black). ? Unusual bruising for unknown reasons. ? A serious fall or if you hit your head (even if there is no bleeding).  Some medicines may interact with Eliquis and might increase your risk of bleeding or clotting while on Eliquis. To help avoid this, consult your healthcare provider or pharmacist prior to using any new prescription or non-prescription medications, including herbals, vitamins, non-steroidal anti-inflammatory drugs (NSAIDs) and supplements.  This website has more information on Eliquis (apixaban):  http://[email or web address].com/eliquis/home

## 2020-05-14 NOTE — Progress Notes (Signed)
Speech Language Pathology Daily Session Note  Patient Details  Name: Lindsey [name] MRN: [id number] Date of Birth: 1955/12/30  Today's Date: 05/14/2020 SLP Individual Time: 1400-1435 SLP Individual Time Calculation (min): 35 min and Today's Date: 05/14/2020 SLP Missed Time: 25 Minutes Missed Time Reason: Patient fatigue  Short Term Goals: Week 2: SLP Short Term Goal 1 (Week 2): Patient will consume current diet with minimal overt s/s of aspiration and overall supervision level verbal and visual cues for use of swallowing compensatory strategies. SLP Short Term Goal 2 (Week 2): Patient will attend/scan to right visual field in 75% of opportunities with Min A verbal and visual cues. SLP Short Term Goal 3 (Week 2): Patient will answer mildly abstract yes/no questions with 75% accuracy and Min A verbal cues. SLP Short Term Goal 4 (Week 2): Patient will produce simple labial/vowel CV and CVC combinations with Min A multimodal cues and 75% accuracy SLP Short Term Goal 5 (Week 2): Patient will express basic wants/needs with use of multimodal communication with Mod A multimodal cues.  Skilled Therapeutic Interventions: Skilled treatment session focused on dysphagia goals. Upon arrival, patient was asleep in bed and required extra time for arousal. With extra time and encouragement, patient agreeable to eat her lunch. SLP facilitated session by providing Mod A verbal and visual cues for use of small bites and to clear left buccal pocketing resulting in overt coughing X 3. Recommend patient continue current diet. Patient appeared disengaged today and declined to work on cognitive-linguistic goals despite encouragement, therefore, patient missed 25 minutes of skilled SLP intervention. Patient left upright in bed with alarm on and all needs within reach. Continue with current plan of care.      Pain No/Denies Pain   Therapy/Group: Individual Therapy  Carlon Davidson 05/14/2020, 2:38 PM

## 2020-05-14 NOTE — Progress Notes (Signed)
Physical Therapy Weekly Progress Note  Patient Details  Name: Lindsey [name] MRN: [id number] Date of Birth: 10/05/1955  Beginning of progress report period: May 07, 2020 End of progress report period: May 14, 2020  Today's Date: 05/14/2020 PT Individual Time: 0800-0915 PT Individual Time Calculation (min): 75 min   Patient has met 1 of 4 short term goals.  Patient with slow, but steady progress this week, limited by fatigue and decreased R attention and motor planning. Currently requires mod-min A for bed mobility and basic transfers, ambulating 30 feet using a L rail with mod A and 55 feet in the Lite Gait.   Patient continues to demonstrate the following deficits muscle weakness, decreased cardiorespiratoy endurance, impaired timing and sequencing, abnormal tone and decreased motor planning, decreased midline orientation, decreased attention to right and decreased motor planning, decreased attention, decreased awareness, decreased problem solving and decreased safety awareness and decreased sitting balance, decreased standing balance, decreased postural control, hemiplegia and decreased balance strategies and therefore will continue to benefit from skilled PT intervention to increase functional independence with mobility.  Patient progressing toward long term goals..  Continue plan of care.  PT Short Term Goals Week 2:  PT Short Term Goal 1 (Week 2): Patient will perform bed mobility with min A. PT Short Term Goal 1 - Progress (Week 2): Met PT Short Term Goal 2 (Week 2): Patient will perform basic transfers with min A. PT Short Term Goal 2 - Progress (Week 2): Progressing toward goal PT Short Term Goal 3 (Week 2): Patient will ambulate 50 feet with max A and LRAD. PT Short Term Goal 3 - Progress (Week 2): Progressing toward goal PT Short Term Goal 4 (Week 2): Patient will perform standing balance >5 min with min A with L UE support. PT Short Term Goal 4 - Progress (Week 2): Progressing  toward goal Week 3:  PT Short Term Goal 1 (Week 3): Patient will perform basic transfers with min A. PT Short Term Goal 2 (Week 3): Patient will ambulate 50 feet with max A and LRAD. PT Short Term Goal 3 (Week 3): Patient will perform standing balance >5 min with min A with L UE support.  Skilled Therapeutic Interventions/Progress Updates:     Patient in bed upon PT arrival. Patient alert and agreeable to PT session. Patient denied pain until end of session when she reported R forearm/wrist sensitivity, no sign of trauma or injury, MD made aware during rounds.  Therapeutic Activity: Bed Mobility: Patient performed supine to/from sit with min A for R LE management with use of bed rail and HOB flat. Provided verbal cues for rolling L, setting L elbow to push up to her elbow then her hand to come to sitting. Patient sat EOB and doffed then donned a shirt with min-mod A and max cues for hemi-technique, she donned shorts with max A for threading LEs and pulling them up in standing, and donned and doffed B socks, tennis shoes and R AFO with total A for time management and energy conservation. Transfers: Patient performed squat pivot bed<>w/c and w/c>mat table with min-mod A, increased assist with fatigue, and stand pivot mat table>w/c with mod A and facilitation for weight shift to R to allow L foot to step. Provided cues for head-hip relationship, hand placement, and forward weight shift. She performed sit to/from stand x4 in the Lite Gait with min A-CGA and x1 without AD with min-mod A. Provided verbal cues for hand placement, scooting forward, R foot placement, and  forward weight shift.  Gait Training:  Patient ambulated 12 feet, 38 feet, and 55 feet in the Lite Gait over ground with max-total A for R LE advancement and a second person for management of equipment. Focused on R LE limb advancement, reciprocal gait pattern, weight shifts, and R quad and gluteal contraction in stance. Provided multmodal cues  for at hips for weight shifts and gluteal activation, sequencing, R limb advancement and quad activation.  Wheelchair Mobility:  Patient was transported in the w/c with total A throughout session for energy conservation and time management.  Neuromuscular Re-ed: Patient performed the following seated R UE and LE activities: -lateral leans to R elbow following by pushing up to sitting using UE only 2x4, provided cues to minimize trunk activation, and PNF tapping to triceps during push up with approximation to R wrist/hand throughout -knee flexion/extension 2x4 with shoe cover placed over shoe and mat table elevated to reduce friction on the floor, patient able to actively bing her foot forward and backwards, unable to lift her foot off the ground to a visual target today  Patient in bed at end of session with breaks locked, bed alarm set, and all needs within reach. MD rounded at end of session.    Therapy Documentation Precautions:  Precautions Precautions: Fall Precaution Comments: R neglect, R hemi Restrictions Weight Bearing Restrictions: No  Therapy/Group: Individual Therapy  Rajon Bisig L [address] PT, DPT  05/14/2020, 12:11 PM

## 2020-05-15 ENCOUNTER — Inpatient Hospital Stay (HOSPITAL_COMMUNITY): Payer: Self-pay | Admitting: Physical Therapy

## 2020-05-15 ENCOUNTER — Inpatient Hospital Stay (HOSPITAL_COMMUNITY): Payer: Self-pay

## 2020-05-15 ENCOUNTER — Inpatient Hospital Stay (HOSPITAL_COMMUNITY): Payer: Self-pay | Admitting: Speech Pathology

## 2020-05-15 MED ORDER — DICLOFENAC SODIUM 1 % EX GEL
2.0000 g | Freq: Three times a day (TID) | CUTANEOUS | Status: DC
  Administered 2020-05-15 – 2020-05-28 (×14): 2 g via TOPICAL
  Filled 2020-05-15: qty 100

## 2020-05-15 NOTE — Progress Notes (Signed)
[facility] PHYSICAL MEDICINE & REHABILITATION PROGRESS NOTE   Subjective/Complaints:  Seems to indicate that she still has pain in lower right arm and hand. Nods "yes" when I asked if it felt like pins and needles  ROS: limited due to language/communication   Objective:   No results found. No results for input(s): WBC, HGB, HCT, PLT in the last 72 hours. No results for input(s): NA, K, CL, CO2, GLUCOSE, BUN, CREATININE, CALCIUM in the last 72 hours.  Intake/Output Summary (Last 24 hours) at 05/15/2020 1109 Last data filed at 05/15/2020 0730 Gross per 24 hour  Intake 338 ml  Output 1250 ml  Net -912 ml     Physical Exam: Vital Signs Blood pressure 110/81, pulse 82, temperature 99 F (37.2 C), temperature source Oral, resp. rate 14, height 5\' 5"  (1.651 m), weight 74.1 kg, SpO2 100 %.  Constitutional: No distress . Vital signs reviewed. HEENT: EOMI, oral membranes moist Neck: supple Cardiovascular: RRR without murmur. No JVD    Respiratory/Chest: CTA Bilaterally without wheezes or rales. Normal effort    GI/Abdomen: BS +, non-tender, non-distended Ext: no clubbing, cyanosis, or edema Psych: pleasant and cooperative Musculoskeletal: Full range of motion in all 4 extremities. No joint swelling Right arm sensitive to palpation especially near wrist and hand. 1/2" sublux at right shoulder Neuro: right central 7 and tongue deviaiton.  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 Verbalizing yes no and occasional basic words in conversation.  Right sided hemiplegia 0/5 RUE and tr/5 prox to 0/5 distally RLE. Left side 5/5-- sl flexor tone RUE ongoing    Assessment/Plan: 1. 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  Physiatrist is providing close team supervision and 24 hour management of active medical problems listed below.  Physiatrist  and rehab team continue to assess barriers to discharge/monitor patient progress toward functional and medical goals  Care Tool:  Bathing    Body parts bathed by patient: Chest, Abdomen, Front perineal area, Left upper leg, Face, Right upper leg, Right lower leg   Body parts bathed by helper: Right arm, Left arm, Buttocks, Right lower leg, Left lower leg     Bathing assist Assist Level: Moderate Assistance - Patient 50 - 74%     Upper Body Dressing/Undressing Upper body dressing   What is the patient wearing?: Pull over shirt    Upper body assist Assist Level: Moderate Assistance - Patient 50 - 74%    Lower Body Dressing/Undressing Lower body dressing      What is the patient wearing?: Incontinence brief, Pants     Lower body assist Assist for lower body dressing: Maximal Assistance - Patient 25 - 49%     Toileting Toileting    Toileting assist Assist for toileting: Total Assistance - Patient < 25%     Transfers Chair/bed transfer  Transfers assist  Chair/bed transfer activity did not occur: Safety/medical concerns  Chair/bed transfer assist level: Moderate Assistance - Patient 50 - 74%     Locomotion Ambulation   Ambulation assist   Ambulation activity did not occur: Safety/medical concerns  Assist level: Dependent - Patient 0% Assistive device: Lite Gait Max distance: 55 ft   Walk 10 feet activity   Assist  Walk 10 feet activity did not occur: Safety/medical concerns  Assist level: Dependent - Patient 0% Assistive device: Lite Gait   Walk 50 feet activity   Assist Walk 50  feet with 2 turns activity did not occur: Safety/medical concerns         Walk 150 feet activity   Assist Walk 150 feet activity did not occur: Safety/medical concerns         Walk 10 feet on uneven surface  activity   Assist Walk 10 feet on uneven surfaces activity did not occur: Safety/medical concerns         Wheelchair     Assist Will patient  use wheelchair at discharge?: Yes Type of Wheelchair: Manual    Wheelchair assist level: Moderate Assistance - Patient 50 - 74% Max wheelchair distance: 150 ft    Wheelchair 50 feet with 2 turns activity    Assist        Assist Level: Moderate Assistance - Patient 50 - 74%   Wheelchair 150 feet activity     Assist      Assist Level: Moderate Assistance - Patient 50 - 74%   Blood pressure 110/81, pulse 82, temperature 99 F (37.2 C), temperature source Oral, resp. rate 14, height 5\' 5"  (1.651 m), weight 74.1 kg, SpO2 100 %.    Medical Problem List and Plan: 1.  R hemiplegia and impaired function/expressive aphasia and R inattention secondary to L MCA stroke with hemorrhagic extension- with dysphagia- on D2 with thins                -patient may  shower             -ELOS/Goals: 8/27, goals Mod I to min A  -Continue CIR    -have spoken with daughterre: plan, prognosis, etc 2.  Antithrombotics: -DVT/anticoagulation:  started eliquis on 8/11 per discussion with neurology             -antiplatelet therapy:  d/c'ed ASA (started eliquis)    3. Pain Management: will add low dose gabapentin 100mg  bid for RUE neuropathic pain  -tylenol prn 4. Mood: LCSW to follow for evaluation and support.              -antipsychotic agents: N/a 5. Neuropsych: This patient is not fully capable of making decisions on her  own behalf. 6. Skin/Wound Care: Routine pressure relief measures. IVs may be removed. 7. Fluids/Electrolytes/Nutrition: Monitor I/O. Na is 135 8/9  -prealbumin only 16.3   8/9   -added remeron at HS for appetite  -8/13 po intake seems to be improving   -check labs Monday 8. A fib with RVR: Monitor HR bid--continue metoprolol 75 mg bid.   -HR in 90's  - eliquis   9. HTN: Monitor BP tid.  On Lopressor 75 mg bid    Vitals:   05/14/20 2023 05/15/20 0613  BP: 114/66 110/81  Pulse: 84 82  Resp: 16 14  Temp: 99 F (37.2 C)   SpO2: 100% 100%  BPs   controlled  8/13 10 Dysphagia: On Dysphagia 2, thin liquids. Encourage intake.   -family can bring in food from home with in dietary restrictions.  11. ETOH abuse: Has not had issues with withdrawal.  12. Prediabetes: Hgb A1c-6.1.   -cbg's have been within target range 13. ABLA: Hgb 12.5 on 8/2.   LOS: 15 days A FACE TO FACE EVALUATION WAS PERFORMED  05/17/20 05/15/2020, 11:09 AM

## 2020-05-15 NOTE — Progress Notes (Signed)
Physical Therapy Session Note  Patient Details  Name: Lindsey [name] MRN: [id number] Date of Birth: 03/05/1956  Today's Date: 05/15/2020 PT Individual Time: 1035-1130 PT Individual Time Calculation (min): 55 min   and  Today's Date: 05/15/2020 PT Missed Time: 30 Minutes Missed Time Reason: Patient fatigue;Patient unwilling to participate  Short Term Goals: Week 3:  PT Short Term Goal 1 (Week 3): Patient will perform basic transfers with min A. PT Short Term Goal 2 (Week 3): Patient will ambulate 50 feet with max A and LRAD. PT Short Term Goal 3 (Week 3): Patient will perform standing balance >5 min with min A with L UE support.  Skilled Therapeutic Interventions/Progress Updates:    Session 1: Pt received sitting in w/c with her mother present and pt agreeable to therapy session. Pt's mother reports pt may require I&O cath if she doesn't void therefore assisted pt with attempted toileting. Transported in/out of bathroom in w/c. Pt wearing R LE GRAFO (Thusane Sprystep plus GRAFO). Stand pivot w/c<>BSC over toilet using grab bar with mod/max assist of 1 and +2 for safety and clothing management - standing with mod progressed to min assist using L UE support on grab bar while +2 performed total assist LB clothing management - pt unable to void.  Transported to/from gym in w/c for time management and energy conservation. Therapist donned R UE Giv Mohr sling for UE support during gait training. Gait training ~39ft at L hallway rail with mod assist of 1 for balance and R LE management; +2 w/c follow - pt initially able to advance R LE during swing without assist but with fatigue required assist - required min assist for R knee control during stance (GRAFO assisting with this also) - pt demos R knee snapping in and out of hyperextension when therapist facilitated increased knee extension and pt reporting this is painful therefore transitioned to allowing excessive knee flexion during stance.  Standing with  mod assist of 1 while +2 assist donned litegait harness. Stepped on/off treadmill while in litegait harness with +2 assist for equipment management and mod/max assist from therapist for balance and assisting with R LE positioning and stepping on/off treadmill (pt's R shoe came off while stepping up requiring assist to get it back on). Performed the following locomotor treadmill training trails using litegait for partial body weight support: 1st trial: 85min3seconds at 0.12mph totaling 60ft - required max/total assist for R LE advancement during swing and knee extension during stance - therapist provided R/L weight shift facilitation onto stance limb  - pt initiates stepping L foot too soon resulting only a few seconds of R stance time with lack of R hip/knee extension muscle activation - had +2 assist block L foot stepping but pt unable to attend to R LE extension while trying to step forwards with L foot despite cuing to redirect attention  In between: performed R LE NMR targeting stance phase control focusing on R hip/knee extension while stepping L foot forward/backwards - requires significant cuing and increased time for motor planning to extend R LE  2nd trial: 69min21seconds starting at 0. decreasing to 0. to allow pt increased time to attend to R LE stance time but pt continues with poor motor planning and difficulty activating R hip/knee extensors and stepping L foot too soon  Stepped off treadmill and doffed harness as described above. Doffed Giv Mohr sling. Transported back to room in w/c and left sitting with needs in reach, seat belt alarm on, R UE supported on  1/2 lap tray, and her mother present.   Session 2: Pt received supine in bed, awake watching TV. Pt shakes head "no" upon therapist entering room. Therapist encourages pt to participate in session but pt continues to shake head "no" stating it is too late in the day. Therapist suggested bed level therapy if pt fatigued but pt  continues to shake head "no" reporting she will try tomorrow. Pt denies pain as a reason for not being agreeable to participate in therapy session at this time. Pt left supine in bed with needs in reach and bed alarm on. Missed 30 minutes of skilled physical therapy.  Therapy Documentation Precautions:  Precautions Precautions: Fall Precaution Comments: R neglect, R hemi Restrictions Weight Bearing Restrictions: No  Pain:   Session 1: Pt reports R hand pain at beginning of session - reports recent medication administration. With standing/ambulating tasks pt reports R knee pain - pt denies needing additional medication administration but agreeable to attempt a topical cream if appropriate - RN notified.   Therapy/Group: Individual Therapy  Ginny Forth , PT, DPT, CSRS  05/15/2020, 8:00 AM

## 2020-05-15 NOTE — Progress Notes (Signed)
Occupational Therapy Weekly Progress Note  Patient Details  Name: Lindsey [name] MRN: [id number] Date of Birth: December 30, 1955  Beginning of progress report period: April 08, 2020 End of progress report period: April 14, 2020  Today's Date: 05/15/2020 OT Individual Time: 0800-0900 OT Individual Time Calculation (min): 60 min    Patient has met 2 of 4 short term goals.  Pt is making slow but steady progress towards OT goals. Pt requires min-MOD A for UB ADLs, MOD A for LB ADLs and MOD A for functional transfers at this time. Pt continues to have varying levels of participation based on mood/adjustment to CLOF. Pt requires encouragement at time to participate in therapy with pt continuing to refuse tx based on fatigue and mood. Pt continues to have trace activation in RUE during functional contexts and NMR tx, however still limited by dense R hemiplegia.   Patient continues to demonstrate the following deficits: muscle weakness, decreased cardiorespiratoy endurance, impaired timing and sequencing, abnormal tone, unbalanced muscle activation, motor apraxia, decreased coordination and decreased motor planning, decreased visual perceptual skills, decreased midline orientation and decreased attention to right, decreased initiation, decreased attention, decreased awareness, decreased problem solving, decreased safety awareness, decreased memory and delayed processing and decreased sitting balance, decreased standing balance, decreased postural control, hemiplegia and decreased balance strategies and therefore will continue to benefit from skilled OT intervention to enhance overall performance with BADL.  Patient progressing toward long term goals..  Continue plan of care.  OT Short Term Goals Week 2:  OT Short Term Goal 1 (Week 2): Pt will bathe 11/11 body parts with min VC for recalling positioning/hemi strategies to demo improved sequencing and R attention OT Short Term Goal 1 - Progress (Week 2): Met OT  Short Term Goal 2 (Week 2): Pt will thread R extremities into clothing with no more than min question cues OT Short Term Goal 2 - Progress (Week 2): Progressing toward goal OT Short Term Goal 3 (Week 2): Pt will locate grooming items on R of sink wiht no VC but increased time OT Short Term Goal 3 - Progress (Week 2): Met OT Short Term Goal 4 (Week 2): Pt will sit to stand wiht MIN A in prep for CM OT Short Term Goal 4 - Progress (Week 2): Progressing toward goal Week 3:  OT Short Term Goal 1 (Week 3): Pt will sit to stand wiht MIN A in prep for CM to for toileting OT Short Term Goal 2 (Week 3): Pt will transfer to toilet with MIN A consistently with LRAD OT Short Term Goal 3 (Week 3): Pt will maintain static standing balance for 69mn with MIN A in prep for LB dressing OT Short Term Goal 4 (Week 3): pt will thread BLE into pants with A for positioning Les only  Skilled Therapeutic Interventions/Progress Updates:    1:1. Pt received in bed agreeable to OT. Pt completes supine>sitting with MOD A for trunk elevation and VC for compensatory strategy to kick RLE off bed. stedy transfer with S EOB>TTB>BSC out of shower with VC for placemnt of RLE. Pt bathes 11/11 body parts with A for sitting balance during lateral lean for buttock hygieene and positioning RLE in seated figure 4 only. Pt able to bathe R extremities without VC today for attention. Pt completes + BM void on toilet and requires MIN A for sit to stand with R knee block with grab bar and MOD A pivot to w/c on R with R knee block. Pt dresses with MIN  A to don shirt with pt unable ot recall hemi dressing without cuing. Pt able to use sock to independently position RLE into figure 4 for dressing with cuing for technique. Pt requires A to don R shoe with AFO, but pt able to thread BLE, B socks, L shoe and advance pants past R hip in standing with MOD A for balance. Pt grooms at sink locating grooming items on R with increased time and head turns. Ot  places saebo stim on R deltoid for shoulder joint aproximation and lets run for 60  min with good response and skin in tact at end of stim session. Exited session with pt seated in bed, exit alarm on and call light in reach  Saebo Stim One 330 pulse width 35 Hz pulse rate On 8 sec/ off 8 sec Ramp up/ down 2 sec Symmetrical Biphasic wave form  Max intensity 138m at 500 Ohm load   Therapy Documentation Precautions:  Precautions Precautions: Fall Precaution Comments: R neglect, R hemi Restrictions Weight Bearing Restrictions: No General:   Vital Signs: Therapy Vitals Pulse Rate: 82 Resp: 14 BP: 110/81 Patient Position (if appropriate): Lying Oxygen Therapy SpO2: 100 % O2 Device: Room Air Pain:   ADL: ADL Grooming: Moderate assistance Where Assessed-Grooming: Edge of bed Upper Body Bathing: Moderate assistance Where Assessed-Upper Body Bathing: Shower Lower Body Bathing: Maximal assistance Where Assessed-Lower Body Bathing: Shower Upper Body Dressing: Maximal assistance Where Assessed-Upper Body Dressing: Wheelchair Lower Body Dressing: Dependent Where Assessed-Lower Body Dressing: Wheelchair, Other (Comment) (stedy) Toileting: Dependent (stedy) Where Assessed-Toileting: Bedside Commode Toilet Transfer: Dependent Toilet Transfer Method:  (stedy) TScience writer Drop arm bedside cMicrobiologist Dependent (stedy) Vision   Perception    Praxis   Exercises:   Other Treatments:     Therapy/Group: Individual Therapy  STonny Branch8/13/2021, 6:40 AM

## 2020-05-15 NOTE — Progress Notes (Signed)
Speech Language Pathology Weekly Progress and Session Note  Patient Details  Name: Lindsey [name] MRN: [id number] Date of Birth: June 09, 1956  Beginning of progress report period: May 07, 2020 End of progress report period: May 15, 2020  Today's Date: 05/15/2020 SLP Individual Time: 1300-1355 SLP Individual Time Calculation (min): 55 min  Short Term Goals: Week 2: SLP Short Term Goal 1 (Week 2): Patient will consume current diet with minimal overt s/s of aspiration and overall supervision level verbal and visual cues for use of swallowing compensatory strategies. SLP Short Term Goal 1 - Progress (Week 2): Not met SLP Short Term Goal 2 (Week 2): Patient will attend/scan to right visual field in 75% of opportunities with Min A verbal and visual cues. SLP Short Term Goal 2 - Progress (Week 2): Met SLP Short Term Goal 3 (Week 2): Patient will answer mildly abstract yes/no questions with 75% accuracy and Min A verbal cues. SLP Short Term Goal 3 - Progress (Week 2): Met SLP Short Term Goal 4 (Week 2): Patient will produce simple labial/vowel CV and CVC combinations with Min A multimodal cues and 75% accuracy SLP Short Term Goal 4 - Progress (Week 2): Met SLP Short Term Goal 5 (Week 2): Patient will express basic wants/needs with use of multimodal communication with Mod A multimodal cues. SLP Short Term Goal 5 - Progress (Week 2): Met    New Short Term Goals: Week 3: SLP Short Term Goal 1 (Week 3): Patient will consume current diet with minimal overt s/s of aspiration and overall supervision level verbal and visual cues for use of swallowing compensatory strategies. SLP Short Term Goal 2 (Week 3): Patient will produce simple CV and CVC combinations with Min A multimodal cues and 90% accuracy SLP Short Term Goal 3 (Week 3): Patient will answer mildly abstract yes/no questions with 90% accuracy and Min A verbal cues. SLP Short Term Goal 4 (Week 3): Patient will express basic wants/needs with use  of multimodal communication with Mod A multimodal cues. SLP Short Term Goal 5 (Week 3): Patient will demonstrate selective attention in a mildly distracting enviornment for 30 minutes with Min verbal cues for redirection. SLP Short Term Goal 6 (Week 3): Patient will demonstrate functional problem solving for basic and familiar tasks with Mod verbal cues.  Weekly Progress Updates: Patient continues to make slow but functional gains and has met 5 of 5 STGs this reporting period. Currently, patient is consuming Dys. 2 textures with thin liquids with minimal overt s/s of aspiration but continues to require Min-Mod A verbal cues for use of swallowing compensatory strategies. Patient continues to require overall Max A multimodal cues for verbal expression at the simple CV and CVC levels due to severe aphasia and apraxia with perseveration. Patient overall expresses her wants/needs with use of gestures and by verbalizing automatic word and phrases at the word level. Patient and family education ongoing. Patient would benefit from continued skilled SLP intervention to maximize her swallowing and communication prior to discharge.    Intensity: Minumum of 1-2 x/day, 30 to 90 minutes Frequency: 3 to 5 out of 7 days Duration/Length of Stay: 05/29/20 Treatment/Interventions: Cognitive remediation/compensation;Cueing hierarchy;Functional tasks;Patient/family education;Speech/Language facilitation;Dysphagia/aspiration precaution training;Internal/external aids;Environmental controls;Therapeutic Activities   Daily Session  Skilled Therapeutic Interventions:  Skilled treatment session focused on communication goals. Upon arrival, patient was in bed but agreeable to participate in treatment session. SLP facilitated session by providing Min A visual cues for problem solving and sequencing with bed mobility and transfer to the wheelchair  via the [place]. Patient answered questions at the word and phrase level regarding her  visitor and visitor information with overall Mod A verbal cues for approximation of phonemes in order for clinician to comprehend message. Patient attempted to write information without success due to perseveration. Patient also produced CV and CVC combination with labial and vowel sounds with overall Min-Mod A visual cues, however, perseveration continues to be a barrier at times. SLP attempted to work on lingua-alveolar sounds (n,d) with minimal success. Patient transferred back to bed at end of session and left with alarm on, all needs within reach and family present. Continue with current plan of care.     Pain No/Denies Pain   Therapy/Group: Individual Therapy  Jaiel Saraceno 05/15/2020, 6:31 AM

## 2020-05-16 ENCOUNTER — Inpatient Hospital Stay (HOSPITAL_COMMUNITY): Payer: Self-pay | Admitting: Speech Pathology

## 2020-05-16 ENCOUNTER — Inpatient Hospital Stay (HOSPITAL_COMMUNITY): Payer: Self-pay | Admitting: Physical Therapy

## 2020-05-16 LAB — URINALYSIS, ROUTINE W REFLEX MICROSCOPIC
Bilirubin Urine: NEGATIVE
Glucose, UA: NEGATIVE mg/dL
Hgb urine dipstick: NEGATIVE
Ketones, ur: NEGATIVE mg/dL
Leukocytes,Ua: NEGATIVE
Nitrite: NEGATIVE
Protein, ur: NEGATIVE mg/dL
Specific Gravity, Urine: 1.013 (ref 1.005–1.030)
pH: 6 (ref 5.0–8.0)

## 2020-05-16 NOTE — Progress Notes (Signed)
SLP Cancellation Note  Patient Details Name: Lindsey [name] MRN: [id number] DOB: 1956/09/28   Cancelled treatment:       Patient missed 45 minutes of skilled SLP intervention, suspect due to fatigue. Patient declined to participate despite encouragement from SLP and her sister. Will re-attempt as able.                                                                                                 Elisandro Jarrett 05/16/2020, 1:40 PM

## 2020-05-16 NOTE — Progress Notes (Signed)
Physical Therapy Session Note  Patient Details  Name: Lindsey [name] MRN: [id number] Date of Birth: 1956/04/04  Today's Date: 05/16/2020 PT Individual Time: 0922-1002 PT Individual Time Calculation (min): 40 min   Short Term Goals: Week 3:  PT Short Term Goal 1 (Week 3): Patient will perform basic transfers with min A. PT Short Term Goal 2 (Week 3): Patient will ambulate 50 feet with max A and LRAD. PT Short Term Goal 3 (Week 3): Patient will perform standing balance >5 min with min A with L UE support.  Skilled Therapeutic Interventions/Progress Updates:    Pt received supine in bed and agreeable to therapy session. Supine>sitting R EOB, HOB partially elevated, with min assist for trunk upright - pt able to initiate R LE movement off EOB with cuing. Sitting EOB donned pants and shoes max assist for time management. Sit>stand EOB>no AD on 3rd try able to come to a full stand with min assist and manual facilitation for R hip/knee extension - pt stands with L UE support on w/c armrest with min assist for balance while therapist pulled up pants over hips total assist. L squat pivot to w/c with mod assist for pivoting hips.  Transported to/from gym in w/c for time management and energy conservation. R squat pivot w/c>EOM with mod assist for lifting/pivoting hips and manual facilitation for R LE WBing during movement.   R LE NMR targeting hip/knee extension for stance phase of gait via: - repeated sit<>stands to/from EOM 2x5reps while L LE on 4" step promoting R LE weight shift - heavy min assist to lift into standing with manual facilitation for R hip/knee extension - mirror feedback to avoid R knee hyperextension once in standing (not wearing R LE AFO)   - L LE stepping forward/backwards to/from external target without UE support - mod assist for balance and therapist blocking R knee buckle and hyperextension though pt demos great quad activation with no significant buckling x8reps - progressed to L  LE stepping on/off 4" step without UE support with mod assist for balance and therapist max manual facilitation for R hip/knee extension - 2sets of 5 reps   Pt reporting significant fatigue and requests to go back to room and rest. L squat pivot EOM>w/c with mod assist for lifting/pivoting hips and manual facilitation for R LE WBing. Transported back to room and pt agreeable to remain seated in w/c. Left with needs in reach, seat belt alarm on, and R UE supported on 1/2 lap tray.   Therapy Documentation Precautions:  Precautions Precautions: Fall Precaution Comments: R neglect, R hemi Restrictions Weight Bearing Restrictions: No  Pain:  Denies pain during session.    Therapy/Group: Individual Therapy  Ginny Forth , PT, DPT, CSRS  05/16/2020, 7:48 AM

## 2020-05-16 NOTE — Progress Notes (Signed)
[place] PHYSICAL MEDICINE & REHABILITATION PROGRESS NOTE   Subjective/Complaints: Voided last night but still with high residuals requiring cath. White vaginal discharge- will obtain UA  ROS: limited due to language/communication   Objective:   No results found. No results for input(s): WBC, HGB, HCT, PLT in the last 72 hours. No results for input(s): NA, K, CL, CO2, GLUCOSE, BUN, CREATININE, CALCIUM in the last 72 hours.  Intake/Output Summary (Last 24 hours) at 05/16/2020 1318 Last data filed at 05/16/2020 1021 Gross per 24 hour  Intake 954 ml  Output 550 ml  Net 404 ml     Physical Exam: Vital Signs Blood pressure 110/87, pulse 99, temperature 99 F (37.2 C), temperature source Oral, resp. rate 14, height 5\' 5"  (1.651 m), weight 73 kg, SpO2 100 %. General: Alert and oriented x 3, No apparent distress HEENT: Head is normocephalic, atraumatic, PERRLA, EOMI, sclera anicteric, oral mucosa pink and moist, dentition intact, ext ear canals clear,  Neck: Supple without JVD or lymphadenopathy Heart: Reg rate and rhythm. No murmurs rubs or gallops Chest: CTA bilaterally without wheezes, rales, or rhonchi; no distress Abdomen: Soft, non-tender, non-distended, bowel sounds positive. Extremities: No clubbing, cyanosis, or edema. Pulses are 2+ Psych: pleasant and cooperative Musculoskeletal: Full range of motion in all 4 extremities. No joint swelling Right arm sensitive to palpation especially near wrist and hand. 1/2" sublux at right shoulder Neuro: right central 7 and tongue deviaiton.  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 Verbalizing yes no and occasional basic words in conversation.  Right sided hemiplegia 0/5 RUE and tr/5 prox to 0/5 distally RLE. Left side 5/5-- sl flexor tone RUE ongoing     Assessment/Plan: 1. 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  Physiatrist is providing close team supervision and 24 hour management of active medical problems listed below.  Physiatrist and rehab team continue to assess barriers to discharge/monitor patient progress toward functional and medical goals  Care Tool:  Bathing    Body parts bathed by patient: Chest, Abdomen, Front perineal area, Left upper leg, Face, Right upper leg, Right lower leg, Right arm, Left lower leg, Buttocks   Body parts bathed by helper: Right arm, Left arm, Buttocks, Right lower leg, Left lower leg     Bathing assist Assist Level: Minimal Assistance - Patient > 75%     Upper Body Dressing/Undressing Upper body dressing   What is the patient wearing?: Pull over shirt    Upper body assist Assist Level: Minimal Assistance - Patient > 75%    Lower Body Dressing/Undressing Lower body dressing      What is the patient wearing?: Incontinence brief, Pants     Lower body assist Assist for lower body dressing: Moderate Assistance - Patient 50 - 74%     Toileting Toileting    Toileting assist Assist for toileting: Total Assistance - Patient < 25%     Transfers Chair/bed transfer  Transfers assist  Chair/bed transfer activity did not occur: Safety/medical concerns  Chair/bed transfer assist level: Moderate Assistance - Patient 50 - 74%     Locomotion Ambulation   Ambulation assist   Ambulation activity did not occur: Safety/medical concerns  Assist level: 2 helpers (mod A of 1 and +2 w/c follow) Assistive device: Other (comment) (hallway rail) Max distance: 40ft   Walk 10 feet activity   Assist  Walk 10 feet activity did  not occur: Safety/medical concerns  Assist level: 2 helpers (mod A of 1 and +2 w/c follow) Assistive device: Other (comment) (hallway rail)   Walk 50 feet activity   Assist Walk 50 feet with 2 turns activity did not occur: Safety/medical concerns         Walk 150 feet  activity   Assist Walk 150 feet activity did not occur: Safety/medical concerns         Walk 10 feet on uneven surface  activity   Assist Walk 10 feet on uneven surfaces activity did not occur: Safety/medical concerns         Wheelchair     Assist Will patient use wheelchair at discharge?: Yes Type of Wheelchair: Manual    Wheelchair assist level: Moderate Assistance - Patient 50 - 74% Max wheelchair distance: 150 ft    Wheelchair 50 feet with 2 turns activity    Assist        Assist Level: Moderate Assistance - Patient 50 - 74%   Wheelchair 150 feet activity     Assist      Assist Level: Moderate Assistance - Patient 50 - 74%   Blood pressure 110/87, pulse 99, temperature 99 F (37.2 C), temperature source Oral, resp. rate 14, height 5\' 5"  (1.651 m), weight 73 kg, SpO2 100 %.    Medical Problem List and Plan: 1.  R hemiplegia and impaired function/expressive aphasia and R inattention secondary to L MCA stroke with hemorrhagic extension- with dysphagia- on D2 with thins                -patient may  shower             -ELOS/Goals: 8/27, goals Mod I to min A  -Continue CIR    -have spoken with daughterre: plan, prognosis, etc 2.  Antithrombotics: -DVT/anticoagulation:  started eliquis on 8/11 per discussion with neurology             -antiplatelet therapy:  d/c'ed ASA (started eliquis)    3. Pain Management: will add low dose gabapentin 100mg  bid for RUE neuropathic pain  -tylenol prn. Well controlled 4. Mood: LCSW to follow for evaluation and support.              -antipsychotic agents: N/a 5. Neuropsych: This patient is not fully capable of making decisions on her  own behalf. 6. Skin/Wound Care: Routine pressure relief measures. IVs may be removed. 7. Fluids/Electrolytes/Nutrition: Monitor I/O. Na is 135 8/9  -prealbumin only 16.3   8/9   -added remeron at HS for appetite  -8/13 po intake seems to be improving   -check labs Monday 8. A  fib with RVR: Monitor HR bid--continue metoprolol 75 mg bid.   -HR in 90s  - eliquis   9. HTN: Monitor BP tid.  On Lopressor 75 mg bid    Vitals:   05/15/20 1942 05/16/20 0454  BP: 100/69 110/87  Pulse: 100 99  Resp: 20 14  Temp:  99 F (37.2 C)  SpO2: 100% 100%  8/14: controlled 10 Dysphagia: On Dysphagia 2, thin liquids. Encourage intake.   -family can bring in food from home with in dietary restrictions.  11. ETOH abuse: Has not had issues with withdrawal.  12. Prediabetes: Hgb A1c-6.1.   -cbg's have been within target range 13. ABLA: Hgb 12.5 on 8/2.   LOS: 16 days A FACE TO FACE EVALUATION WAS PERFORMED  Lindsey [name] Lindsey [name] 05/16/2020, 1:18 PM

## 2020-05-17 NOTE — Progress Notes (Addendum)
Occupational Therapy Session Note  Patient Details  Name: Lindsey [name] MRN: [id number] Date of Birth: 04-18-1956  Today's Date: 05/17/2020 OT Individual Time: 1045-1055 OT Individual Time Calculation (min): 10 min (make up time)   Short Term Goals: Week 3:  OT Short Term Goal 1 (Week 3): Pt will sit to stand wiht MIN A in prep for CM to for toileting OT Short Term Goal 2 (Week 3): Pt will transfer to toilet with MIN A consistently with LRAD OT Short Term Goal 3 (Week 3): Pt will maintain static standing balance for 89mn with MIN A in prep for LB dressing OT Short Term Goal 4 (Week 3): pt will thread BLE into pants with A for positioning Les only  Skilled Therapeutic Interventions/Progress Updates:    1:1. Pt received in bed declining any OOB options when rovided with make up time options. Pt agreeable to repositioning higher in bed and Ot places pillow under arm for support to prevent sublux. OT provides gentle PROM of joints in all planes to prevent contracture then places saebo stim one on R deltoid. Pt agreeable to 60 min unattended estim. Exited session with pt in bed, exit alarm on and call light in reach.  Saebo Stim One 330 pulse width 35 Hz pulse rate On 8 sec/ off 8 sec Ramp up/ down 2 sec Symmetrical Biphasic wave form  Max intensity 1146mat 500 Ohm load Skin in tact at end of session with all needs met. Pt tolerated well.   Therapy Documentation Precautions:  Precautions Precautions: Fall Precaution Comments: R neglect, R hemi Restrictions Weight Bearing Restrictions: No General:   Vital Signs:   Pain: Pain Assessment Pain Scale: 0-10 Pain Score: 0-No pain ADL: ADL Grooming: Moderate assistance Where Assessed-Grooming: Edge of bed Upper Body Bathing: Moderate assistance Where Assessed-Upper Body Bathing: Shower Lower Body Bathing: Maximal assistance Where Assessed-Lower Body Bathing: Shower Upper Body Dressing: Maximal assistance Where Assessed-Upper  Body Dressing: Wheelchair Lower Body Dressing: Dependent Where Assessed-Lower Body Dressing: Wheelchair, Other (Comment) (stedy) Toileting: Dependent (stedy) Where Assessed-Toileting: Bedside Commode Toilet Transfer: Dependent Toilet Transfer Method:  (stedy) ToScience writerDrop arm bedside coMicrobiologistDependent (stedy) Vision   Perception    Praxis   Exercises:   Other Treatments:     Therapy/Group: Individual Therapy  [place]ny [place]/15/2021, 10:59 AM

## 2020-05-17 NOTE — Progress Notes (Signed)
[place] PHYSICAL MEDICINE & REHABILITATION PROGRESS NOTE   Subjective/Complaints: No complaints this morning  ROS: limtied due to language/communication   Objective:   No results found. No results for input(s): WBC, HGB, HCT, PLT in the last 72 hours. No results for input(s): NA, K, CL, CO2, GLUCOSE, BUN, CREATININE, CALCIUM in the last 72 hours.  Intake/Output Summary (Last 24 hours) at 05/17/2020 1438 Last data filed at 05/17/2020 5277 Gross per 24 hour  Intake 118 ml  Output 600 ml  Net -482 ml     Physical Exam: Vital Signs Blood pressure 117/79, pulse 77, temperature 98.4 F (36.9 C), resp. rate 15, height 5\' 5"  (1.651 m), weight 73.8 kg, SpO2 100 %. General: Alert and oriented x 3, No apparent distress HEENT: Head is normocephalic, atraumatic, PERRLA, EOMI, sclera anicteric, oral mucosa pink and moist, dentition intact, ext ear canals clear,  Neck: Supple without JVD or lymphadenopathy Heart: Reg rate and rhythm. No murmurs rubs or gallops Chest: CTA bilaterally without wheezes, rales, or rhonchi; no distress Abdomen: Soft, non-tender, non-distended, bowel sounds positive. Extremities: No clubbing, cyanosis, or edema. Pulses are 2+ Psych: pleasant and cooperative Musculoskeletal: Full range of motion in all 4 extremities. No joint swelling Right arm sensitive to palpation especially near wrist and hand. 1/2" sublux at right shoulder Neuro: right central 7 and tongue deviaiton.  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 Verbalizing yes no and occasional basic words in conversation.  Right sided hemiplegia 0/5 RUE and tr/5 prox to 0/5 distally RLE. Left side 5/5-- sl flexor tone RUE ongoing  Assessment/Plan: 1. 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  Physiatrist is providing close team supervision and 24 hour management  of active medical problems listed below.  Physiatrist and rehab team continue to assess barriers to discharge/monitor patient progress toward functional and medical goals  Care Tool:  Bathing    Body parts bathed by patient: Chest, Abdomen, Front perineal area, Left upper leg, Face, Right upper leg, Right lower leg, Right arm, Left lower leg, Buttocks   Body parts bathed by helper: Right arm, Left arm, Buttocks, Right lower leg, Left lower leg     Bathing assist Assist Level: Minimal Assistance - Patient > 75%     Upper Body Dressing/Undressing Upper body dressing   What is the patient wearing?: Pull over shirt    Upper body assist Assist Level: Minimal Assistance - Patient > 75%    Lower Body Dressing/Undressing Lower body dressing      What is the patient wearing?: Incontinence brief, Pants     Lower body assist Assist for lower body dressing: Moderate Assistance - Patient 50 - 74%     Toileting Toileting    Toileting assist Assist for toileting: Total Assistance - Patient < 25%     Transfers Chair/bed transfer  Transfers assist  Chair/bed transfer activity did not occur: Safety/medical concerns  Chair/bed transfer assist level: Moderate Assistance - Patient 50 - 74%     Locomotion Ambulation   Ambulation assist   Ambulation activity did not occur: Safety/medical concerns  Assist level: 2 helpers (mod A of 1 and +2 w/c follow) Assistive device: Other (comment) (hallway rail) Max distance: 42ft   Walk 10 feet activity   Assist  Walk 10 feet activity did not occur: Safety/medical concerns  Assist level: 2 helpers (mod A of 1 and +2 w/c follow) Assistive  device: Other (comment) (hallway rail)   Walk 50 feet activity   Assist Walk 50 feet with 2 turns activity did not occur: Safety/medical concerns         Walk 150 feet activity   Assist Walk 150 feet activity did not occur: Safety/medical concerns         Walk 10 feet on uneven  surface  activity   Assist Walk 10 feet on uneven surfaces activity did not occur: Safety/medical concerns         Wheelchair     Assist Will patient use wheelchair at discharge?: Yes Type of Wheelchair: Manual    Wheelchair assist level: Moderate Assistance - Patient 50 - 74% Max wheelchair distance: 150 ft    Wheelchair 50 feet with 2 turns activity    Assist        Assist Level: Moderate Assistance - Patient 50 - 74%   Wheelchair 150 feet activity     Assist      Assist Level: Moderate Assistance - Patient 50 - 74%   Blood pressure 117/79, pulse 77, temperature 98.4 F (36.9 C), resp. rate 15, height 5\' 5"  (1.651 m), weight 73.8 kg, SpO2 100 %.    Medical Problem List and Plan: 1.  R hemiplegia and impaired function/expressive aphasia and R inattention secondary to L MCA stroke with hemorrhagic extension- with dysphagia- on D2 with thins                -patient may  shower             -ELOS/Goals: 8/27, goals Mod I to min A  -Continue CIR    -have spoken with daughterre: plan, prognosis, etc 2.  Antithrombotics: -DVT/anticoagulation:  started eliquis on 8/11 per discussion with neurology             -antiplatelet therapy:  d/c'ed ASA (started eliquis)    3. Pain Management: will add low dose gabapentin 100mg  bid for RUE neuropathic pain  -tylenol prn. Well controlled 4. Mood: LCSW to follow for evaluation and support.              -antipsychotic agents: N/a 5. Neuropsych: This patient is not fully capable of making decisions on her  own behalf. 6. Skin/Wound Care: Routine pressure relief measures. IVs may be removed. 7. Fluids/Electrolytes/Nutrition: Monitor I/O. Na is 135 8/9  -prealbumin only 16.3   8/9   -added remeron at HS for appetite  -8/13 po intake seems to be improving   -check labs Monday 8. A fib with RVR: Monitor HR bid--continue metoprolol 75 mg bid.   -HR in 90s  - eliquis   9. HTN: Monitor BP tid.  On Lopressor 75 mg bid     Vitals:   05/17/20 0330 05/17/20 1321  BP: 124/62 117/79  Pulse:  77  Resp:  15  Temp:  98.4 F (36.9 C)  SpO2:  100%  8/15 well controlled 10 Dysphagia: On Dysphagia 2, thin liquids. Encourage intake.   -family can bring in food from home with in dietary restrictions.  11. ETOH abuse: Has not had issues with withdrawal.  12. Prediabetes: Hgb A1c-6.1.   -cbg's well controlled 13. ABLA: Hgb 12.5 on 8/2.   LOS: 17 days A FACE TO FACE EVALUATION WAS PERFORMED  05/19/20 05/17/2020, 2:38 PM

## 2020-05-18 ENCOUNTER — Inpatient Hospital Stay (HOSPITAL_COMMUNITY): Payer: Self-pay | Admitting: Speech Pathology

## 2020-05-18 ENCOUNTER — Inpatient Hospital Stay (HOSPITAL_COMMUNITY): Payer: Self-pay

## 2020-05-18 LAB — BASIC METABOLIC PANEL
Anion gap: 12 (ref 5–15)
BUN: 11 mg/dL (ref 8–23)
CO2: 21 mmol/L — ABNORMAL LOW (ref 22–32)
Calcium: 9.3 mg/dL (ref 8.9–10.3)
Chloride: 105 mmol/L (ref 98–111)
Creatinine, Ser: 0.56 mg/dL (ref 0.44–1.00)
GFR calc Af Amer: >60 mL/min (ref >60)
GFR calc non Af Amer: >60 mL/min (ref >60)
Glucose, Bld: 106 mg/dL — ABNORMAL HIGH (ref 70–99)
Potassium: 3.9 mmol/L (ref 3.5–5.1)
Sodium: 138 mmol/L (ref 135–145)

## 2020-05-18 LAB — CBC
HCT: 34.4 % — ABNORMAL LOW (ref 36.0–46.0)
Hemoglobin: 11.1 g/dL — ABNORMAL LOW (ref 12.0–15.0)
MCH: 28.8 pg (ref 26.0–34.0)
MCHC: 32.3 g/dL (ref 30.0–36.0)
MCV: 89.1 fL (ref 80.0–100.0)
Platelets: 248 10*3/uL (ref 150–400)
RBC: 3.86 MIL/uL — ABNORMAL LOW (ref 3.87–5.11)
RDW: 13.6 % (ref 11.5–15.5)
WBC: 3.6 10*3/uL — ABNORMAL LOW (ref 4.0–10.5)
nRBC: 0 % (ref 0.0–0.2)

## 2020-05-18 LAB — PREALBUMIN: Prealbumin: 15.9 mg/dL — ABNORMAL LOW (ref 18–38)

## 2020-05-18 NOTE — Progress Notes (Signed)
PHYSICAL MEDICINE & REHABILITATION PROGRESS NOTE   Subjective/Complaints: No complaints this morning  ROS: limtied due to language/communication   Objective:   No results found. Recent Labs    05/18/20 0547  WBC 3.6*  HGB 11.1*  HCT 34.4*  PLT 248   Recent Labs    05/18/20 0547  NA 138  K 3.9  CL 105  CO2 21*  GLUCOSE 106*  BUN 11  CREATININE 0.56  CALCIUM 9.3    Intake/Output Summary (Last 24 hours) at 05/18/2020 1156 Last data filed at 05/18/2020 0803 Gross per 24 hour  Intake 200 ml  Output 500 ml  Net -300 ml     Physical Exam: Vital Signs Blood pressure 102/62, pulse 72, temperature 98.9 F (37.2 C), resp. rate 18, height 5\' 5"  (1.651 m), weight 73.8 kg, SpO2 93 %. Constitutional: No distress . Vital signs reviewed. HEENT: EOMI, oral membranes moist Neck: supple Cardiovascular: RRR without murmur. No JVD    Respiratory/Chest: CTA Bilaterally without wheezes or rales. Normal effort    GI/Abdomen: BS +, non-tender, non-distended Ext: no clubbing, cyanosis, or edema Psych: generally pleasant and cooperative Musculoskeletal: Full range of motion in all 4 extremities. No joint swelling Right arm sensitive to palpation especially near wrist and hand. 1/2" sublux at right shoulder Neuro: expressive aphasia, apraxia. Verbalizing yes no and occasional basic words in conversation.  Right sided hemiplegia 0/5 RUE (perhaps trace proximal movement) and tr/5 prox to 0/5 distally RLE. Left side 5/5-- sl flexor tone RUE ongoing  Assessment/Plan: 1. 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  Physiatrist is providing close team supervision and 24 hour management of active medical problems listed below.  Physiatrist and rehab team continue to assess barriers to discharge/monitor patient progress toward functional and  medical goals  Care Tool:  Bathing    Body parts bathed by patient: Chest, Abdomen, Front perineal area, Left upper leg, Face, Right upper leg, Right lower leg, Right arm, Left lower leg, Buttocks   Body parts bathed by helper: Right arm, Left arm, Buttocks, Right lower leg, Left lower leg     Bathing assist Assist Level: Minimal Assistance - Patient > 75%     Upper Body Dressing/Undressing Upper body dressing   What is the patient wearing?: Pull over shirt    Upper body assist Assist Level: Minimal Assistance - Patient > 75%    Lower Body Dressing/Undressing Lower body dressing      What is the patient wearing?: Incontinence brief, Pants     Lower body assist Assist for lower body dressing: Moderate Assistance - Patient 50 - 74%     Toileting Toileting    Toileting assist Assist for toileting: Total Assistance - Patient < 25%     Transfers Chair/bed transfer  Transfers assist  Chair/bed transfer activity did not occur: Safety/medical concerns  Chair/bed transfer assist level: Moderate Assistance - Patient 50 - 74%     Locomotion Ambulation   Ambulation assist   Ambulation activity did not occur: Safety/medical concerns  Assist level: 2 helpers (mod A of 1 and +2 w/c follow) Assistive device: Other (comment) (hallway rail) Max distance: 42ft   Walk 10 feet activity   Assist  Walk 10 feet activity did not occur: Safety/medical concerns  Assist level: 2 helpers (mod A of 1 and +2 w/c follow) Assistive device: Other (comment) (hallway rail)  Walk 50 feet activity   Assist Walk 50 feet with 2 turns activity did not occur: Safety/medical concerns         Walk 150 feet activity   Assist Walk 150 feet activity did not occur: Safety/medical concerns         Walk 10 feet on uneven surface  activity   Assist Walk 10 feet on uneven surfaces activity did not occur: Safety/medical concerns         Wheelchair     Assist Will  patient use wheelchair at discharge?: Yes Type of Wheelchair: Manual    Wheelchair assist level: Moderate Assistance - Patient 50 - 74% Max wheelchair distance: 150 ft    Wheelchair 50 feet with 2 turns activity    Assist        Assist Level: Moderate Assistance - Patient 50 - 74%   Wheelchair 150 feet activity     Assist      Assist Level: Moderate Assistance - Patient 50 - 74%   Blood pressure 102/62, pulse 72, temperature 98.9 F (37.2 C), resp. rate 18, height 5\' 5"  (1.651 m), weight 73.8 kg, SpO2 93 %.    Medical Problem List and Plan: 1.  R hemiplegia and impaired function/expressive aphasia and R inattention secondary to L MCA stroke with hemorrhagic extension- with dysphagia- on D2 with thins  -patient may  shower             -ELOS/Goals: 8/27, goals Mod I to min A  -Continue CIR    -have spoken with daughterre: plan, prognosis, etc 2.  Antithrombotics: -DVT/anticoagulation:  started eliquis on 8/11 per discussion with neurology             -antiplatelet therapy:  d/c'ed ASA (started eliquis)    3. Pain Management: will add low dose gabapentin 100mg  bid for RUE neuropathic pain  -tylenol prn. Well controlled 4. Mood: LCSW to follow for evaluation and support.              -antipsychotic agents: N/a 5. Neuropsych: This patient is not fully capable of making decisions on her  own behalf. 6. Skin/Wound Care: Routine pressure relief measures. IVs may be removed. 7. Fluids/Electrolytes/Nutrition: Monitor I/O. Na is 138 8/16  -prealbumin only 16.3   8/9---> 15.9 today 8/16   -continue remeron at HS for appetite  - po intake seems to be improving     8. A fib with RVR: Monitor HR bid--continue metoprolol 75 mg bid.   -HR 70's+  - eliquis   9. HTN: Monitor BP tid.  On Lopressor 75 mg bid    Vitals:   05/18/20 0442 05/18/20 0833  BP: 112/84 102/62  Pulse: 72   Resp: 18   Temp: 98.9 F (37.2 C)   SpO2: 93%   8/16--well controlled 10 Dysphagia: On  Dysphagia 2, thin liquids. Encourage intake.   -family can bring in food from home with in dietary restrictions.  11. ETOH abuse: Has not had issues with withdrawal.  12. Prediabetes: Hgb A1c-6.1.   -cbg's well controlled 13. ABLA: Hgb 12.5 on 8/2.   LOS: 18 days A FACE TO FACE EVALUATION WAS PERFORMED  05/20/20 05/18/2020, 11:56 AM

## 2020-05-18 NOTE — Plan of Care (Signed)
°  Problem: Consults Goal: RH STROKE PATIENT EDUCATION Description: See Patient Education module for education specifics  Outcome: Progressing Goal: Nutrition Consult-if indicated Outcome: Progressing Goal: Diabetes Guidelines if Diabetic/Glucose > 140 Description: If diabetic or lab glucose is > 140 mg/dl - Initiate Diabetes/Hyperglycemia Guidelines & Document Interventions  Outcome: Progressing   Problem: RH BOWEL ELIMINATION Goal: RH STG MANAGE BOWEL WITH ASSISTANCE Description: STG Manage Bowel with Assistance. Outcome: Progressing Goal: RH STG MANAGE BOWEL W/MEDICATION W/ASSISTANCE Description: STG Manage Bowel with Medication with Assistance. Outcome: Progressing   Problem: RH BLADDER ELIMINATION Goal: RH STG MANAGE BLADDER WITH ASSISTANCE Description: STG Manage Bladder With Assistance Outcome: Progressing Goal: RH STG MANAGE BLADDER WITH MEDICATION WITH ASSISTANCE Description: STG Manage Bladder With Medication With Assistance. Outcome: Progressing Goal: RH STG MANAGE BLADDER WITH EQUIPMENT WITH ASSISTANCE Description: STG Manage Bladder With Equipment With Assistance Outcome: Progressing   Problem: RH SKIN INTEGRITY Goal: RH STG SKIN FREE OF INFECTION/BREAKDOWN Outcome: Progressing Goal: RH STG MAINTAIN SKIN INTEGRITY WITH ASSISTANCE Description: STG Maintain Skin Integrity With Assistance. Outcome: Progressing Goal: RH STG ABLE TO PERFORM INCISION/WOUND CARE W/ASSISTANCE Description: STG Able To Perform Incision/Wound Care With Assistance. Outcome: Progressing   Problem: RH SAFETY Goal: RH STG ADHERE TO SAFETY PRECAUTIONS W/ASSISTANCE/DEVICE Description: STG Adhere to Safety Precautions With Assistance/Device. Outcome: Progressing Goal: RH STG DECREASED RISK OF FALL WITH ASSISTANCE Description: STG Decreased Risk of Fall With Assistance. Outcome: Progressing   Problem: RH COGNITION-NURSING Goal: RH STG USES MEMORY AIDS/STRATEGIES W/ASSIST TO PROBLEM  SOLVE Description: STG Uses Memory Aids/Strategies With Assistance to Problem Solve. Outcome: Progressing Goal: RH STG ANTICIPATES NEEDS/CALLS FOR ASSIST W/ASSIST/CUES Description: STG Anticipates Needs/Calls for Assist With Assistance/Cues. Outcome: Progressing   Problem: RH PAIN MANAGEMENT Goal: RH STG PAIN MANAGED AT OR BELOW PT'S PAIN GOAL Outcome: Progressing   Problem: RH KNOWLEDGE DEFICIT Goal: RH STG INCREASE KNOWLEDGE OF DIABETES Outcome: Progressing Goal: RH STG INCREASE KNOWLEDGE OF HYPERTENSION Outcome: Progressing Goal: RH STG INCREASE KNOWLEDGE OF DYSPHAGIA/FLUID INTAKE Outcome: Progressing Goal: RH STG INCREASE KNOWLEGDE OF HYPERLIPIDEMIA Outcome: Progressing Goal: RH STG INCREASE KNOWLEDGE OF STROKE PROPHYLAXIS Outcome: Progressing   Problem: RH Vision Goal: RH LTG Vision (Specify) Outcome: Progressing   

## 2020-05-18 NOTE — Progress Notes (Signed)
Speech Language Pathology Daily Session Note  Patient Details  Name: Rikita Grabert MRN: [id number] Date of Birth: [birth year]  Today's Date: 05/18/2020 SLP Individual Time: 1300-1355 SLP Individual Time Calculation (min): 55 min  Short Term Goals: Week 3: SLP Short Term Goal 1 (Week 3): Patient will consume current diet with minimal overt s/s of aspiration and overall supervision level verbal and visual cues for use of swallowing compensatory strategies. SLP Short Term Goal 2 (Week 3): Patient will produce simple CV and CVC combinations with Min A multimodal cues and 90% accuracy SLP Short Term Goal 3 (Week 3): Patient will answer mildly abstract yes/no questions with 90% accuracy and Min A verbal cues. SLP Short Term Goal 4 (Week 3): Patient will express basic wants/needs with use of multimodal communication with Mod A multimodal cues. SLP Short Term Goal 5 (Week 3): Patient will demonstrate selective attention in a mildly distracting enviornment for 30 minutes with Min verbal cues for redirection. SLP Short Term Goal 6 (Week 3): Patient will demonstrate functional problem solving for basic and familiar tasks with Mod verbal cues.  Skilled Therapeutic Interventions: Skilled treatment session focused on communication goals. Upon arrival, patient was supine in bed and agreeable to participate in treatment session. Patient was transferred to the wheelchair via the [facility] with Min-Mod verbal cues for problem solving and safety with task. Patient participated in a phrase completion task with use of pictures with overall Mod-Max A verbal and articulatory cues to produce target word with 75% accuracy. Patient demonstrated increased ability to utilize articulatory cues in order to produce phonemes /d/, /w/ /t/ and /n/. However, patient unable to produce /ch/, /sh/. Overall, patient with increased ability to verbalize at the word level and increased ability to respond with appropriate social interactions at  the phrase level. Patient transferred back to bed at end of session. Continue with current plan of care.      Pain No/Denies pain   Therapy/Group: Individual Therapy  Allyiah Gartner 05/18/2020, 1:58 PM

## 2020-05-18 NOTE — Progress Notes (Signed)
Occupational Therapy Session Note  Patient Details  Name: Lindsey [name] MRN: [id number] Date of Birth: Oct 02, 1956  Today's Date: 05/18/2020 OT Individual Time: 9381-8299 OT Individual Time Calculation (min): 45 min    Week 3:  OT Short Term Goal 1 (Week 3): Pt will sit to stand wiht MIN A in prep for CM to for toileting OT Short Term Goal 2 (Week 3): Pt will transfer to toilet with MIN A consistently with LRAD OT Short Term Goal 3 (Week 3): Pt will maintain static standing balance for with MIN A in prep for LB dressing OT Short Term Goal 4 (Week 3): pt will thread BLE into pants with A for positioning Les only  Skilled Therapeutic Interventions/Progress Updates:    Pt received supine with no c/o pain. Agreeable to session. Pt in good spirits this morning and laughing with therapist. Pt completed bed mobility to EOB with mod A for facilitation of RLE coming off the side of the bed and for trunk assist. Pt completed squat pivot transfer with min A to w/c. Pt required mod A to doff shirt, no carryover of hemi dressing techniques. Pt completed UB bathing with mod A overall. Total A facilitation required for RUE management during bathing. Pt donned shirt with mod A. Pt donned pants with mod A, requiring assistance to thread over RLE and to pull up in standing. Pt able to assist with pulling up shorts in standing but requiring increased static standing balance assist with heavy lean/reliance on OT on the R side. Pt encouraged to wash hemi hand thoroughly for attention to R side and for hand hygiene. Pt able to do so with min A to dry. Pt completed hair care with min A to brush R side of head. Pt was left sitting up in the w/c with lap tray positioned on the R to improve RUE positioning at rest. Saebo placed on pt's R shoulder, around the humeral head to assist with joint approximation. [facility] Suite specifications listed below. Pt tolerated saebo for 60 min unattended with no adverse reactions or pain.    Saebo Stim One 330 pulse width 35 Hz pulse rate On 8 sec/ off 8 sec Ramp up/ down 2 sec Symmetrical Biphasic wave form  Max intensity at 500 Ohm load  Therapy Documentation Precautions:  Precautions Precautions: Fall Precaution Comments: R neglect, R hemi Restrictions Weight Bearing Restrictions: No  Therapy/Group: Individual Therapy  Crissie Reese 05/18/2020, 6:42 AM

## 2020-05-18 NOTE — Progress Notes (Signed)
Physical Therapy Session Note  Patient Details  Name: Lindsey [name] MRN: [id number] Date of Birth: 07-22-1956  Today's Date: 05/18/2020 PT Individual Time: 1050-1202 PT Individual Time Calculation (min): 72 min   Short Term Goals: Week 3:  PT Short Term Goal 1 (Week 3): Patient will perform basic transfers with min A. PT Short Term Goal 2 (Week 3): Patient will ambulate 50 feet with max A and LRAD. PT Short Term Goal 3 (Week 3): Patient will perform standing balance >5 min with min A with L UE support.  Skilled Therapeutic Interventions/Progress Updates:     Patient in w/c in the room upon PT arrival. Patient alert and agreeable to PT session. Patient reported 3-4/10 headache at beginning of session, RN made aware. PT provided repositioning, rest breaks, and distraction as pain interventions throughout session.   Vitals: BP: 109/97, MAP 103, HR 106 Retook vitals after quiet rest x2 min: BP 119/96, MAP 106, HR 93 RN made aware. Reported that patient did not receive some BP meds this morning due to low systolic BP. RN advised to continue with therapies and monitor symptoms/BP with mobility.   Therapeutic Activity: Bed Mobility: Patient performed sit to supine with min A for R LE management in a flat bed without use of bed rails. Provided verbal cues for lifting R LE into the bed with notable muscle activation and initiation with mobility, requiring <25% assist. Transfers: Patient performed sit to/from stand x4 with HHA and x2 using L rail with min A and blocking R knee. Provided verbal cues for hand placement, forward weight shift, controlled descent, and R hip and knee extension to stand. Patient performed blocked practice slide board transfers w/c<>mat table x2 and w/c>bed with CGA to the L and min A to the R and total A on the R and max A on the L for board placement. Provided cues for hand placement, board placement, and head-hips relationship for proper technique and decreased assist with  transfers.   Gait Training:  Patient ambulated 30 feet x2 using L rail and L GRAFO with heel lift for reduced R extensor thrust with min-mod A for trunk support and R foot advancement and placement. Ambulated with step-to gait pattern leading with R. Provided verbal cues for sequencing and erect posture and manual facilitation for R knee control with multimodal cues for R quad and gluteal activation in stance and R hip flexor activation for limb advancement.  Wheelchair Mobility:  Patient was transported in the w/c with total A throughout session for energy conservation and time management.  Neuromuscular Re-ed: Patient performed the following standing balance activities: -standing balance 4x1-2 min with min A using HHA from RT and manual facilitation and multimodal cues for improved kneed control in standing, used a mirror for visual feedback throughout  Trial 1: with shoe only with alternating extensor thrust and knee buckling in standing without facilitation  Trial 2: with GRAFO with extensor thrust without facilitation  Trial 3&4: with GRAFO with heel lift with improved extensor thrust, but still present without facilitation  Patient required total A for donning doffing shoes and AFO throughout session for energy conservation and time management secondary to R hemiplegia.  Headache resolved during mobility. Patient otherwise asymptomatic throughout session. Vitals: BP 111/71, MAP 83, HR 59.  Patient in bed at end of session with breaks locked, bed alarm set, and all needs within reach.    Therapy Documentation Precautions:  Precautions Precautions: Fall Precaution Comments: R neglect, R hemi Restrictions Weight Bearing Restrictions:  No   Therapy/Group: Individual Therapy  Xitlali Kastens L Ercilia Bettinger PT, DPT  05/18/2020, 4:27 PM

## 2020-05-19 ENCOUNTER — Inpatient Hospital Stay (HOSPITAL_COMMUNITY): Payer: Self-pay

## 2020-05-19 ENCOUNTER — Inpatient Hospital Stay (HOSPITAL_COMMUNITY): Payer: Self-pay | Admitting: Speech Pathology

## 2020-05-19 ENCOUNTER — Inpatient Hospital Stay (HOSPITAL_COMMUNITY): Payer: Self-pay | Admitting: Physical Therapy

## 2020-05-19 NOTE — Progress Notes (Signed)
Speech Language Pathology Daily Session Note  Patient Details  Name: Lindsey [name] MRN: [id number] Date of Birth: [birth year]  Today's Date: 05/19/2020 SLP Individual Time: 4098-1191 SLP Individual Time Calculation (min): 25 min  Short Term Goals: Week 3: SLP Short Term Goal 1 (Week 3): Patient will consume current diet with minimal overt s/s of aspiration and overall supervision level verbal and visual cues for use of swallowing compensatory strategies. SLP Short Term Goal 2 (Week 3): Patient will produce simple CV and CVC combinations with Min A multimodal cues and 90% accuracy SLP Short Term Goal 3 (Week 3): Patient will answer mildly abstract yes/no questions with 90% accuracy and Min A verbal cues. SLP Short Term Goal 4 (Week 3): Patient will express basic wants/needs with use of multimodal communication with Mod A multimodal cues. SLP Short Term Goal 5 (Week 3): Patient will demonstrate selective attention in a mildly distracting enviornment for 30 minutes with Min verbal cues for redirection. SLP Short Term Goal 6 (Week 3): Patient will demonstrate functional problem solving for basic and familiar tasks with Mod verbal cues.  Skilled Therapeutic Interventions: Skilled treatment session focused on communication goals. SLP facilitated session by providing overall Mod-Max A multimodal cues for producing functional words during a phrase closure task. Patient produced target words with 50% accuracy with decreased ability to produce /t/, /N/ and consonant blends. Patient left upright in bed with alarm on and all needs within reach. Continue with current plan of care.      Pain Pain Assessment Pain Scale: 0-10 Pain Score: 0-No pain  Therapy/Group: Individual Therapy  Kalila Adkison 05/19/2020, 10:02 AM

## 2020-05-19 NOTE — Plan of Care (Signed)
  Problem: RH Comprehension Communication Goal: LTG Patient will comprehend basic/complex auditory (SLP) Description: LTG: Patient will comprehend basic/complex auditory information with cues (SLP). Flowsheets (Taken 05/19/2020 0617) LTG: Patient will comprehend auditory information with cueing (SLP): Minimal Assistance - Patient > 75% Note: Downgraded due to lack of progress   Problem: RH Expression Communication Goal: LTG Patient will express needs/wants via multi-modal(SLP) Description: LTG:  Patient will express needs/wants via multi-modal communication (gestures/written, etc) with cues (SLP) Flowsheets (Taken 05/19/2020 0617) LTG: Patient will express needs/wants via multimodal communication (gestures/written, etc) with cueing (SLP): Minimal Assistance - Patient > 75% Note: Downgraded due to lack of progress Goal: LTG Patient will increase word finding of common (SLP) Description: LTG:  Patient will increase word finding of common objects/daily info/abstract thoughts with cues using compensatory strategies (SLP). Flowsheets Taken 05/19/2020 0617 by Huston Foley, CCC-SLP LTG: Patient will increase word finding of common (SLP): Moderate Assistance - Patient 50 - 74% Taken 05/01/2020 1707 by Ivin Poot, CCC-SLP Patient will use compensatory strategies to increase word finding of: Common objects Note: Downgraded due to lack of progress   Problem: RH Expression Communication Goal: LTG Patient will verbally express basic/complex needs(SLP) Description: LTG:  Patient will verbally express basic/complex needs, wants or ideas with cues  (SLP) Flowsheets (Taken 05/19/2020 0617) LTG: Patient will verbally express basic/complex needs, wants or ideas (SLP): (at word level) Maximal Assistance - Patient 25 - 49% Note: Downgraded due to lack of progress   Problem: RH Problem Solving Goal: LTG Patient will demonstrate problem solving for (SLP) Description: LTG:  Patient will demonstrate  problem solving for basic/complex daily situations with cues  (SLP) Flowsheets (Taken 05/19/2020 0617) LTG Patient will demonstrate problem solving for: Minimal Assistance - Patient > 75% Note: Downgraded due to lack of progress

## 2020-05-19 NOTE — Patient Care Conference (Signed)
Inpatient RehabilitationTeam Conference and Plan of Care Update Date: 05/19/2020   Time: 10:30 AM    Patient Name: Lindsey [name]      Medical Record Number: [id number]  Date of Birth: Jan 22, 1956 Sex: Female         Room/Bed: 4W23C/4W23C-01 Payor Info: Payor: /    Admit Date/Time:  04/30/2020  3:45 PM  Primary Diagnosis:  Right hemiplegia [facility])  Hospital Problems: Principal Problem:   Right hemiplegia [facility]) Active Problems:   Acute ischemic left MCA stroke [facility])    Expected Discharge Date: Expected Discharge Date: 05/29/20  Team Members Present: Physician leading conference: Dr. Faith Rogue Care Coodinator Present: Kennyth Arnold, RN, BSN, CRRN;Cecile Sheerer, LCSWA Nurse Present: Other (comment) Eulah Citizen, RN) PT Present: Serina Cowper, PT OT Present: Jake Shark, OT SLP Present: Feliberto Gottron, SLP PPS Coordinator present : Edson Snowball, Park Breed, SLP     Current Status/Progress Goal Weekly Team Focus  Bowel/Bladder   Continent with periods of incontinence. LBM 8/18  fewer periods of incontinence  q2 toileting while a wake.   Swallow/Nutrition/ Hydration   Dys. 2 textures with thin liquids, Min A  Supervision  use of swallowing strategies, trial of Dys. 3 textures   ADL's   Mod A UB ADLs, mod A LB ADLs, min-mod A sit <> stands, mod A squat pivot. Trace activation in the R bicep  min A - (S)  ADL retraining, ADL transfers, RUE NMR, psychosocial adjustment, d/c planning   Mobility   Min-mod A overall, gait 30 ft with L rail, >90 ft in Lite Gait  Supervision bed mobility and w/c mobility, min A transfers, mod A gait 50 ft  R attention, functional mobility, gait training, balance, R UE/LE NMR, w/c mobility, participation, patient/caregiver education   Communication   Mod-Max A  Min A  functional communication with multimodal cues   Safety/Cognition/ Behavioral Observations  Min-Mod A  Supervision  attention, carryover of functional information    Pain   no c/o pain  Pt will remain pain free while at the hospital  assess pain qshift and PRN   Skin   No skin impairments  Skin will remain intact, will prevent rash or breakdown  assess skin qshift and PRN     Discharge Planning:  Patient to discharge home with daughter who will provide care. Pt to also have assistance from grandson and sister who will provide care as well. Fam Edu scheduled for 8/20 with dtr and [name]; and 8/23 dtr and pt sister [name].   Team Discussion: Progressing well. RUE still no return. RLE still limits some transfers. Continue gait training. SLP, pt has low frustration tolerance. Patient on target to meet rehab goals: yes  *See Care Plan and progress notes for long and short-term goals.   Revisions to Treatment Plan:  none  Teaching Needs: Continue gait training, continue family education.  Current Barriers to Discharge: RUE and RLE non-movement  Possible Resolutions to Barriers: RUE has no return as of yet, RLE still limits some of her transfers     Medical Summary Current Status: slow return on right side. early tone. developing neuro pain  Barriers to Discharge: Medical stability   Possible Resolutions to Barriers/Weekly Focus: rx pain, rom/tone mgt, daily med mgt   Continued Need for Acute Rehabilitation Level of Care: The patient requires daily medical management by a physician with specialized training in physical medicine and rehabilitation for the following reasons: Direction of a multidisciplinary physical rehabilitation program to maximize  functional independence : Yes Medical management of patient stability for increased activity during participation in an intensive rehabilitation regime.: Yes Analysis of laboratory values and/or radiology reports with any subsequent need for medication adjustment and/or medical intervention. : Yes   I attest that I was present, lead the team conference, and concur with the  assessment and plan of the team.   Tennis Must 05/19/2020, 2:28 PM

## 2020-05-19 NOTE — Progress Notes (Signed)
Occupational Therapy Session Note  Patient Details  Name: Lindsey [name] MRN: [id number] Date of Birth: [birth year]  Today's Date: 05/19/2020 OT Individual Time: 1033-1130 OT Individual Time Calculation (min): 57 min    Short Term Goals: Week 3:  OT Short Term Goal 1 (Week 3): Pt will sit to stand wiht MIN A in prep for CM to for toileting OT Short Term Goal 2 (Week 3): Pt will transfer to toilet with MIN A consistently with LRAD OT Short Term Goal 3 (Week 3): Pt will maintain static standing balance for 16mn with MIN A in prep for LB dressing OT Short Term Goal 4 (Week 3): pt will thread BLE into pants with A for positioning Les only  Skilled Therapeutic Interventions/Progress Updates:    Pt received supine, no c/o pain and agreeable to take shower. Pt completed bed mobility with mod A to lift trunk and manage RLE. Pt completed squat pivot transfer to the L with min A to the w/c. Pt was taken into the bathroom where she completed a stand pivot transfer into the shower onto TTB with mod A, difficult positioning to block RLE from buckling. Pt washed UB with mod A overall, with HOH provided for RUE to wash LUE and to wash under RUE. Pt completed sit > stand from shower chair with min A to block RLE and for standing balance. Mod A to wash buttocks in standing. Pt returned to sitting and washed anterior peri areas with set up assist. Pt dried off and completed transfer to w/c with mod A. Pt completed oral care at the sink with set up assist. Pt was able to don L sock and shoe with CGA using figure 4 technique! Facilitation required to hold RLE into figure 4, but then pt was able to don sock. Assist required to don AFO and shoe.  Pt was transported via w/c to the therapy gym. Pt completed sit > stand and with mod facilitation was able to weightbear through extended RUE on low table. Pt then completed functional reaching with the LUE across midline and into R visual field to promote increased shoulder  weightbearing. Visual scanning component added, as pt was instructed to select only red coins out of blue and white chips, while still maintaining R UE/LE extension weightbearing. Mod A for standing balance and blocking required. Pt able to identify all red coins but with poor self monitoring to begin. Pt returned to her room and was left sitting up with all needs met, Chair alarm fastened.   Therapy Documentation Precautions:  Precautions Precautions: Fall Precaution Comments: R neglect, R hemi Restrictions Weight Bearing Restrictions: No  Therapy/Group: Individual Therapy  SCurtis Sites8/17/2021, 6:52 AM

## 2020-05-19 NOTE — Progress Notes (Signed)
Physical Therapy Session Note  Patient Details  Name: Lindsey [name] MRN: [id number] Date of Birth: [birth year]  Today's Date: 05/19/2020 PT Individual Time: 1553-1701 PT Individual Time Calculation (min): 68 min   Short Term Goals: Week 3:  PT Short Term Goal 1 (Week 3): Patient will perform basic transfers with min A. PT Short Term Goal 2 (Week 3): Patient will ambulate 50 feet with max A and LRAD. PT Short Term Goal 3 (Week 3): Patient will perform standing balance >5 min with min A with L UE support.  Skilled Therapeutic Interventions/Progress Updates:    Pt received supine in bed and agreeable to therapy session with minimal encouragement. Supine>sitting R EOB with min assist for trunk upright - with increased time and 3 attempts able to get R LE off EOB without assist. Sitting EOB donned shoes max assist for time management - pt able to lift R LE off floor to assist with placing foot in shoe (pt was unable to do this last time this therapist saw pt). L squat pivot EOB>w/c with mod assist for lifting/pivoting hips and manual facilitation for R LE positioning.  Transported to/from gym in w/c for time management and energy conservation. R squat pivot w/c>EOM mod assist for lifting/pivoting hips, cuing for decreased use of L UE to force increased R LE WBing and muscle activation. Sit<>stands to/from EOM, no UE support, with min assist for balance and guarding R knee.   Standing balance and sit<>stand task of playing horseshoes progressed to R LE NMR via stepping L foot forward/backwards when throwing horseshoe - mod assist of 1 for balance and guarding R knee (no significant buckling and a few instances of hyperextension if not guarded). While standing pt continues to demo R lateral lean - cuing for correction - anticipate this is partly due to R hip abductor weakness.  Standing balance without UE support and R LE NMR through WBing while stepping L LE forward/backwards on/off 4" step progressed to  L LE lateral stepping on/off step (to target increased R hip abductor activation) with mod assist of 1 for balance and guarding R knee - no significant buckle with pt demoing improving quad muscle activation and control; blocking a few instances of hyperextension. +2 assist present throughout for safety and pt using L HHA for the 1st step in each direction to increase pt confidence.  Standing>tall kneeling on mat table with UE support on bench +2 mod assist for balance and R LE management on mat. Started with static tall kneeling without UE support working on balance and midline orientation with mirror feedback. Progressed to R reaching with L UE to grasp horseshoes - manual facilitation for increased R weight shift and R hip extension while facilitating R UE WBing on bench - min/mod assist for balance throughout. Repeated tall kneeling<>sitting on heels 2x2 reps with mod/max manual facilitation for hip extension to return to tall kneeling - this was very challenging for pt requiring a lot of energy. Tall kneeling>quadruped>L sidelying>sitting EOM with +2 mod assist for R hemibody management and trunk control.  R LE NMR via repeated R LE step up/down on/off 1st (6" height) step of stairs with L UE support on HR 2x 3 reps - mod/max assist of 1 for balance and R LE management +2 min assist for balance and safety - pt able to initiate lifting R LE to place on step but required max assist to clear completely - require heavy mod assist to stabilize R knee while stepping up onto the  step then light mod assist for eccentric control when stepping L foot off step.  With significant encouragement pt agreeable to 1 trial of gait training. Gait training ~60ft x 1 at L hallway rail with mod assist of 1 for balance and R LE management with +2 w/c follow - pt able to advance R LE during swing with only min assist for repositioning and to promote increased heel strike, pt had 1x R knee hyperextension during stance therefore  blocked this remainder of gait causing slight increased R knee flexion during stance - min assist to block R knee during stance without any significant buckling noted.  Transported back to room and pt requesting for assist to bed to rest. R squat pivot w/c>EOB with mod assist for lifting/pivoting hips. Sit>stand EOB>no AD with min assist and min assist for balance while doffing pants, per pt request. Doffed shoes max assist. Sit>supine with min assist for R hemibody management. Pt left supine in bed with needs in reach and bed alarm on.    Therapy Documentation Precautions:  Precautions Precautions: Fall Precaution Comments: R neglect, R hemi Restrictions Weight Bearing Restrictions: No  Pain:   No reports of pain throughout session.   Therapy/Group: Individual Therapy  Ginny Forth , PT, DPT, CSRS  05/19/2020, 12:16 PM

## 2020-05-19 NOTE — Progress Notes (Signed)
[place] PHYSICAL MEDICINE & REHABILITATION PROGRESS NOTE   Subjective/Complaints: No new problems. Pain a little better RUE.   ROS: limited due to language/communication   Objective:   No results found. Recent Labs    05/18/20 0547  WBC 3.6*  HGB 11.1*  HCT 34.4*  PLT 248   Recent Labs    05/18/20 0547  NA 138  K 3.9  CL 105  CO2 21*  GLUCOSE 106*  BUN 11  CREATININE 0.56  CALCIUM 9.3    Intake/Output Summary (Last 24 hours) at 05/19/2020 1203 Last data filed at 05/19/2020 0700 Gross per 24 hour  Intake 790 ml  Output --  Net 790 ml     Physical Exam: Vital Signs Blood pressure 100/62, pulse 71, temperature 97.9 F (36.6 C), resp. rate 18, height 5\' 5"  (1.651 m), weight 73.8 kg, SpO2 99 %. Constitutional: No distress . Vital signs reviewed. HEENT: EOMI, oral membranes moist Neck: supple Cardiovascular: RRR without murmur. No JVD    Respiratory/Chest: CTA Bilaterally without wheezes or rales. Normal effort    GI/Abdomen: BS +, non-tender, non-distended Ext: no clubbing, cyanosis, or edema Psych: pleasant and cooperative Musculoskeletal: Full range of motion in all 4 extremities. No joint swelling seen. Right forearms still a little sensitive.  Neuro: expressive aphasia, apraxia. Verbalizing yes no and occasional basic words in conversation.  Right sided hemiplegia 0 to tr/5 RUE  and tr/5 prox to 0/5 distally RLE. Left side 5/5-- sl flexor tone RUE ongoing  Assessment/Plan: 1. 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  Physiatrist is providing close team supervision and 24 hour management of active medical problems listed below.  Physiatrist and rehab team continue to assess barriers to discharge/monitor patient progress toward functional and medical goals  Care Tool:  Bathing    Body parts bathed by patient:  Chest, Abdomen, Front perineal area, Left upper leg, Face, Right upper leg, Right lower leg, Right arm, Left lower leg, Buttocks   Body parts bathed by helper: Right arm, Left arm, Buttocks, Right lower leg, Left lower leg     Bathing assist Assist Level: Minimal Assistance - Patient > 75%     Upper Body Dressing/Undressing Upper body dressing   What is the patient wearing?: Pull over shirt    Upper body assist Assist Level: Minimal Assistance - Patient > 75%    Lower Body Dressing/Undressing Lower body dressing      What is the patient wearing?: Incontinence brief, Pants     Lower body assist Assist for lower body dressing: Moderate Assistance - Patient 50 - 74%     Toileting Toileting    Toileting assist Assist for toileting: Total Assistance - Patient < 25%     Transfers Chair/bed transfer  Transfers assist  Chair/bed transfer activity did not occur: Safety/medical concerns  Chair/bed transfer assist level: Minimal Assistance - Patient > 75%     Locomotion Ambulation   Ambulation assist   Ambulation activity did not occur: Safety/medical concerns  Assist level: Moderate Assistance - Patient 50 - 74% Assistive device: Other (comment) (L rail and R AFO with heel lift) Max distance: 30 ft   Walk 10 feet activity   Assist  Walk 10 feet activity did not occur: Safety/medical concerns  Assist level: Minimal Assistance - Patient > 75% Assistive device: Other (comment) (hallway rail)   Walk 50 feet activity  Assist Walk 50 feet with 2 turns activity did not occur: Safety/medical concerns         Walk 150 feet activity   Assist Walk 150 feet activity did not occur: Safety/medical concerns         Walk 10 feet on uneven surface  activity   Assist Walk 10 feet on uneven surfaces activity did not occur: Safety/medical concerns         Wheelchair     Assist Will patient use wheelchair at discharge?: Yes Type of Wheelchair: Manual     Wheelchair assist level: Moderate Assistance - Patient 50 - 74% Max wheelchair distance: 150 ft    Wheelchair 50 feet with 2 turns activity    Assist        Assist Level: Moderate Assistance - Patient 50 - 74%   Wheelchair 150 feet activity     Assist      Assist Level: Moderate Assistance - Patient 50 - 74%   Blood pressure 100/62, pulse 71, temperature 97.9 F (36.6 C), resp. rate 18, height 5\' 5"  (1.651 m), weight 73.8 kg, SpO2 99 %.    Medical Problem List and Plan: 1.  R hemiplegia and impaired function/expressive aphasia and R inattention secondary to L MCA stroke with hemorrhagic extension- with dysphagia- on D2 with thins  -patient may  shower             -ELOS/Goals: 8/27, goals Mod I to min A  -Continue CIR--team conference today    -have spoken with daughterre: plan, prognosis, etc 2.  Antithrombotics: -DVT/anticoagulation:  started eliquis on 8/11 per discussion with neurology             -antiplatelet therapy:  d/c'ed ASA (started eliquis)    3. Pain Management: will add low dose gabapentin 100mg  bid for RUE neuropathic pain  -tylenol prn. Well controlled 4. Mood: LCSW to follow for evaluation and support.              -antipsychotic agents: N/a 5. Neuropsych: This patient is not fully capable of making decisions on her  own behalf. 6. Skin/Wound Care: Routine pressure relief measures. IVs may be removed. 7. Fluids/Electrolytes/Nutrition: Monitor I/O. Na is 138 8/16  -prealbumin only 16.3   8/9---> 15.9   8/16   -continue remeron at HS for appetite  - po intake slowly improving  -weight around 74kg currently 8. A fib with RVR: Monitor HR bid--continue metoprolol 75 mg bid.   -HR 70's-90's  - eliquis   9. HTN: Monitor BP tid.  On Lopressor 75 mg bid    Vitals:   05/18/20 1922 05/19/20 0445  BP: 128/79 100/62  Pulse: 95 71  Resp: 18 18  Temp: 98.3 F (36.8 C) 97.9 F (36.6 C)  SpO2:  99%  8/17--well controlled 10 Dysphagia: On Dysphagia  2, thin liquids. Encourage intake.   -family can bring in food from home with in dietary restrictions.  11. ETOH abuse: Has not had issues with withdrawal.  12. Prediabetes: Hgb A1c-6.1.   -cbg's well controlled 13. ABLA: Hgb 11.1 8/16   LOS: 19 days A FACE TO FACE EVALUATION WAS PERFORMED  05/20/20 05/19/2020, 12:03 PM

## 2020-05-19 NOTE — Progress Notes (Signed)
Physical Therapy Session Note  Patient Details  Name: Lessie Funderburke MRN: [id number] Date of Birth: [birth year]  Today's Date: 05/19/2020 PT Individual Time: 1345-1445 PT Individual Time Calculation (min): 60 min   Short Term Goals: Week 3:  PT Short Term Goal 1 (Week 3): Patient will perform basic transfers with min A. PT Short Term Goal 2 (Week 3): Patient will ambulate 50 feet with max A and LRAD. PT Short Term Goal 3 (Week 3): Patient will perform standing balance >5 min with min A with L UE support.  Skilled Therapeutic Interventions/Progress Updates:     Patient in bed asleep upon PT arrival. Patient easily aroused and agreeable to PT session. Patient denied pain during session.  Therapeutic Activity: Bed Mobility: Patient performed supine to/from sit with min A for trunk and R LE management in a flat bed with use of bed rail. Provided verbal cues for sliding LEs off the bed, rolling on her R side, setting R elbow with facilitation for placement, and pushing up to her R elbow then to sitting. Transfers: Patient performed sit to/from stand x2 with min A and blocking R knee using L rail. Provided verbal cues for forward weight shift and R hip/knee extension. Patient performed a slide board transfer bed<>w/c and w/c<>mat table with CGA to the L and min A-CGA to the R and total A for board placement. Provided cues for hand placement, board placement, and head-hips relationship for proper technique and decreased assist with transfers.   Gait Training:  Patient ambulated 30 feet, 20 feet, and 10 feet using L rail with DF wrap with min-mod A for trunk support, R foot placement, and blocking R knee from hyperextending in stance. Patient able to advance R LE without assist, except for last trial due to fatigue, assist provided for placement due to increased adductor activation leading to narrow BOS and hip ER. Provided verbal cues for sequencing and R limb advancement. Mirror provided for visual  feedback during all trials.   Wheelchair Mobility:  Patient was transported in the w/c with total A throughout session for energy conservation and time management.  Neuromuscular Re-ed: Patient performed the following R UE and LE motor control activities: -R lateral leans x5 with facilitation of R UE with approximation at wrist and hand to promote weight bearing and triceps activation -B scapular retraction 2x5 focused on activation of R scapular musculature to promote support to R shoulder due to minor inferior subluxation -R knee extension x10 with patient progressing to full AROM with visual target -R knee extension x10 with 2-3 sec hold through full AROM with visual target  Patient in bed at end of session with breaks locked, bed alarm set, and all needs within reach.    Therapy Documentation Precautions:  Precautions Precautions: Fall Precaution Comments: R neglect, R hemi Restrictions Weight Bearing Restrictions: No    Therapy/Group: Individual Therapy  Oskar Cretella L Karlen Barbar PT, DPT  05/19/2020, 5:29 PM

## 2020-05-19 NOTE — Progress Notes (Signed)
Patient ID: Lindsey [name], female   DOB: 1956-06-17, 64 y.o.   MRN: [id number]  SW received phone call from pt dtr Lindsey [name] to give updates from team conference, and d/c date still 8/27. Fam edu 8/20 8am-12pm with her and grandson Lindsey [name]; 8/23 8am-12pm with dtr, and sisters- Lindsey [name] or Lindsey [name]. She is still unable to find DME in community.   Cecile Sheerer, MSW, LCSWA Office: [phone] Cell: [phone] Fax: [phone]

## 2020-05-20 ENCOUNTER — Inpatient Hospital Stay (HOSPITAL_COMMUNITY): Payer: Self-pay

## 2020-05-20 ENCOUNTER — Inpatient Hospital Stay (HOSPITAL_COMMUNITY): Payer: Self-pay | Admitting: Speech Pathology

## 2020-05-20 NOTE — Progress Notes (Signed)
[place] PHYSICAL MEDICINE & REHABILITATION PROGRESS NOTE   Subjective/Complaints: No complaints this morning Working hard with therapy!  ROS: limited due to language/communication   Objective:   No results found. Recent Labs    05/18/20 0547  WBC 3.6*  HGB 11.1*  HCT 34.4*  PLT 248   Recent Labs    05/18/20 0547  NA 138  K 3.9  CL 105  CO2 21*  GLUCOSE 106*  BUN 11  CREATININE 0.56  CALCIUM 9.3    Intake/Output Summary (Last 24 hours) at 05/20/2020 1913 Last data filed at 05/20/2020 1716 Gross per 24 hour  Intake 407 ml  Output --  Net 407 ml     Physical Exam: Vital Signs Blood pressure 109/81, pulse 66, temperature 97.6 F (36.4 C), resp. rate 17, height 5\' 5"  (1.651 m), weight 75.6 kg, SpO2 93 %. Constitutional: No distress . Vital signs reviewed. HEENT: EOMI, oral membranes moist Neck: supple Cardiovascular: RRR without murmur. No JVD    Respiratory/Chest: CTA Bilaterally without wheezes or rales. Normal effort    GI/Abdomen: BS +, non-tender, non-distended Ext: no clubbing, cyanosis, or edema Psych: pleasant and cooperative    General: Alert and oriented x 3, No apparent distress HEENT: Head is normocephalic, atraumatic, PERRLA, EOMI, sclera anicteric, oral mucosa pink and moist, dentition intact, ext ear canals clear,  Neck: Supple without JVD or lymphadenopathy Heart: Reg rate and rhythm. No murmurs rubs or gallops Chest: CTA bilaterally without wheezes, rales, or rhonchi; no distress Abdomen: Soft, non-tender, non-distended, bowel sounds positive. Extremities: No clubbing, cyanosis, or edema. Pulses are 2+ Skin: Clean and intact without signs of breakdown Musculoskeletal: Full range of motion in all 4 extremities. No joint swelling seen. Right forearms still a little sensitive.  Neuro: expressive aphasia, apraxia. Verbalizing yes no and occasional basic words in conversation.  Right sided hemiplegia 0 to tr/5 RUE  and tr/5 prox to 0/5  distally RLE. Left side 5/5-- sl flexor tone RUE ongoing   Assessment/Plan: 1. 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  Physiatrist is providing close team supervision and 24 hour management of active medical problems listed below.  Physiatrist and rehab team continue to assess barriers to discharge/monitor patient progress toward functional and medical goals  Care Tool:  Bathing    Body parts bathed by patient: Chest, Abdomen, Front perineal area, Left upper leg, Face, Right upper leg, Right lower leg, Right arm, Left lower leg   Body parts bathed by helper: Left arm, Buttocks     Bathing assist Assist Level: Moderate Assistance - Patient 50 - 74%     Upper Body Dressing/Undressing Upper body dressing   What is the patient wearing?: Pull over shirt    Upper body assist Assist Level: Moderate Assistance - Patient 50 - 74%    Lower Body Dressing/Undressing Lower body dressing      What is the patient wearing?: Incontinence brief, Pants     Lower body assist Assist for lower body dressing: Moderate Assistance - Patient 50 - 74%     Toileting Toileting    Toileting assist Assist for toileting: Total Assistance - Patient < 25%     Transfers Chair/bed transfer  Transfers assist  Chair/bed transfer activity did not occur: Safety/medical concerns  Chair/bed transfer assist level: Contact Guard/Touching assist Chair/bed transfer assistive device: Sliding board   Locomotion Ambulation   Ambulation  assist   Ambulation activity did not occur: Safety/medical concerns  Assist level: Minimal Assistance - Patient > 75% Assistive device: Other (comment) (L rail) Max distance: 30 ft   Walk 10 feet activity   Assist  Walk 10 feet activity did not occur: Safety/medical concerns  Assist level: Minimal Assistance - Patient >  75% Assistive device: Other (comment) (L hallway rail)   Walk 50 feet activity   Assist Walk 50 feet with 2 turns activity did not occur: Safety/medical concerns         Walk 150 feet activity   Assist Walk 150 feet activity did not occur: Safety/medical concerns         Walk 10 feet on uneven surface  activity   Assist Walk 10 feet on uneven surfaces activity did not occur: Safety/medical concerns         Wheelchair     Assist Will patient use wheelchair at discharge?: Yes Type of Wheelchair: Manual    Wheelchair assist level: Moderate Assistance - Patient 50 - 74% Max wheelchair distance: 150 ft    Wheelchair 50 feet with 2 turns activity    Assist        Assist Level: Moderate Assistance - Patient 50 - 74%   Wheelchair 150 feet activity     Assist      Assist Level: Moderate Assistance - Patient 50 - 74%   Blood pressure 109/81, pulse 66, temperature 97.6 F (36.4 C), resp. rate 17, height 5\' 5"  (1.651 m), weight 75.6 kg, SpO2 93 %.    Medical Problem List and Plan: 1.  R hemiplegia and impaired function/expressive aphasia and R inattention secondary to L MCA stroke with hemorrhagic extension- with dysphagia- on D2 with thins  -patient may  shower             -ELOS/Goals: 8/27, goals Mod I to min A  -Continue CIR    -have spoken with daughterre: plan, prognosis, etc 2.  Antithrombotics: -DVT/anticoagulation:  started eliquis on 8/11 per discussion with neurology             -antiplatelet therapy:  d/c'ed ASA (started eliquis)    3. Pain Management: will add low dose gabapentin 100mg  bid for RUE neuropathic pain  -tylenol prn. Well controlled 4. Mood: LCSW to follow for evaluation and support.              -antipsychotic agents: N/a 5. Neuropsych: This patient is not fully capable of making decisions on her  own behalf. 6. Skin/Wound Care: Routine pressure relief measures. IVs may be removed. 7. Fluids/Electrolytes/Nutrition:  Monitor I/O. Na is 138 8/16  -prealbumin only 16.3   8/9---> 15.9   8/16   -continue remeron at HS for appetite  - po intake slowly improving  -weight around 74kg currently 8. A fib with RVR: Monitor HR bid--continue metoprolol 75 mg bid.   -HR 70's-90's  - eliquis    Well controlled 9. HTN: Monitor BP tid.  On Lopressor 75 mg bid    Vitals:   05/20/20 0520 05/20/20 1319  BP: 104/72 109/81  Pulse: 74 66  Resp: 16 17  Temp: 99.4 F (37.4 C) 97.6 F (36.4 C)  SpO2: 99% 93%  8/17--well controlled 10 Dysphagia: On Dysphagia 2, thin liquids. Encourage intake.   -family can bring in food from home with in dietary restrictions.  11. ETOH abuse: Has not had issues with withdrawal.  12. Prediabetes: Hgb A1c-6.1.   -cbg's well controlled 13. ABLA: Hgb  11.1 8/16   LOS: 20 days A FACE TO FACE EVALUATION WAS PERFORMED  Drema Pry Bates [place] 05/20/2020, 7:13 PM

## 2020-05-20 NOTE — Progress Notes (Signed)
Speech Language Pathology Daily Session Note  Patient Details  Name: Lindsey [name] MRN: [id number] Date of Birth: 18-Apr-1956  Today's Date: 05/20/2020 SLP Individual Time: 1000-1055 SLP Individual Time Calculation (min): 55 min  Short Term Goals: Week 3: SLP Short Term Goal 1 (Week 3): Patient will consume current diet with minimal overt s/s of aspiration and overall supervision level verbal and visual cues for use of swallowing compensatory strategies. SLP Short Term Goal 2 (Week 3): Patient will produce simple CV and CVC combinations with Min A multimodal cues and 90% accuracy SLP Short Term Goal 3 (Week 3): Patient will answer mildly abstract yes/no questions with 90% accuracy and Min A verbal cues. SLP Short Term Goal 4 (Week 3): Patient will express basic wants/needs with use of multimodal communication with Mod A multimodal cues. SLP Short Term Goal 5 (Week 3): Patient will demonstrate selective attention in a mildly distracting enviornment for 30 minutes with Min verbal cues for redirection. SLP Short Term Goal 6 (Week 3): Patient will demonstrate functional problem solving for basic and familiar tasks with Mod verbal cues.  Skilled Therapeutic Interventions: Skilled treatment session focused on communication goals. Upon arrival, patient attempting to verbalize with utilization of gestures to something on her dresser. With extra time and Mod verbal cues, patient requested to donn her bra. Max-Total A multimodal cues were needed for hemi-dressing techniques. SLP facilitated session by providing Mod-Max A verbal cues for patient to verbalize functional sentences with articulatory and written cues. Max A multimodal cues were needed to produce the phoneme /s/ in initial position. Overall, patient continues to demonstrate improved ability to produce individual phonemes and verbalize at the word level with increased ability to self-monitor phonemic errors. Patient transferred back to bed at end of  session via the [facility]. Patient left supine in bed with alarm on and all needs within reach. Continue with current plan of care.      Pain Pain Assessment Pain Scale: 0-10 Pain Score: 0-No pain  Therapy/Group: Individual Therapy  Darold Miley 05/20/2020, 12:30 PM

## 2020-05-20 NOTE — Progress Notes (Signed)
Physical Therapy Session Note  Patient Details  Name: Lindsey [name] MRN: [id number] Date of Birth: [birth year]  Today's Date: 05/20/2020 PT Individual Time: 0800-0915 PT Individual Time Calculation (min): 75 min   Short Term Goals: Week 3:  PT Short Term Goal 1 (Week 3): Patient will perform basic transfers with min A. PT Short Term Goal 2 (Week 3): Patient will ambulate 50 feet with max A and LRAD. PT Short Term Goal 3 (Week 3): Patient will perform standing balance >5 min with min A with L UE support.  Skilled Therapeutic Interventions/Progress Updates:     Patient in bed with NT in room upon PT arrival. Patient alert and agreeable to PT session. Patient denied pain during session. Patient eager to work with therapy this morning. Spoke in small phrases throughout session with intermittent difficulty with word finding.   Therapeutic Activity: Bed Mobility: Patient performed supine to sit with min A for trunk support and sit to supine with supervision with increased time for lifting R LE onto the bed. Bed mobility performed in a flat bed without use of bed rails and on a mat table. Provided verbal cues for rolling to the R, setting R elbow, pushing with L hand up to R elbow with min facilitation for placement, then pushing up to sitting using R UE and L lateral trunk muscles. She performed rolling supine<>prone on a mat table, rolling through L side-lying, with min A-supervision. Provided cues for pushing through R LE to roll, placement of L arm overhead when rolling to/form prone, and setting R elbow to prop on elbows in prone. Transfers: Patient performed a toilet transfers using the [facility] for time management of beginning of session. Patient was continent of bladder during toileting, required total A for peri-care and LB dressing. She performed sit to/from stand x3 with CGA using L rail with PT blocking R knee for safety without buckling. Provided verbal cues for R foot placement, forward weight  shift, and R knee/hip extension and control when standing. Patient performed a slide board transfer bed<>w/c and w/c<>mat table with CGA for safety/balance and min A on L and total A on R for board placement. Provided cues for hand placement, board placement, and head-hips relationship for proper technique and decreased assist with transfers.   Gait Training:  Patient ambulated 30 feet using L rail with min A for trunk support and foot placement, no assist for R LE advancement today. Ambulated with step-to gait pattern leading with R with PT blocking R knee in front and back to prevent instability into buckling or hyperextension, patient pushed back to hyperextend x4 due to poor R knee control and LE fatigue following NMR. Provided verbal cues for sequencing, timing, weight shift, R quad and hamstring activation in stance for improved knee control, and foot placement with toes ahead versus ER. Placed mirror in front of patient for visual feedback throughout.   Wheelchair Mobility:  Patient propelled wheelchair ~20 feet with supervision and cues for hemi-techique using L UE/LE. Patient became frustrated with veering R and declined propelling any further despite encouragement from PT.  Neuromuscular Re-ed: Patient performed the following R LE motor control activities focused on increased hamstring and gluteal activation: -heel slides x4 lying in supine on mat table in regular sock for reduced friction, required >50% assist and heavy cues for maintaining neutral hip rotation -heel slides 3x5 lying in supine with slide board with down slope toward patient, elevated on 3" step, required min A to facilitate hamstring activation and  maintain hip rotation at neutral -bridging 3x5 with green band around thighs with PT blocking R foot, provided multimodal cues for equal hip elevation  -hamstring curls in supine 2x5, PNF tapping to hamstrings with >75% assist progressing to ~25 % assist through full ROM -stepping  up with R foot on/off 3" step in standing, provided cues for L weight shift, hip and knee flexion and DF to bring foot on/off step, improved hip and knee flexion throughout, progressed from total A x1 to no assist, provided facilitation for weight shift and R ankle control to prevent inversion  Patient in bed at end of session with breaks locked, bed alarm set, and all needs within reach.    Therapy Documentation Precautions:  Precautions Precautions: Fall Precaution Comments: R neglect, R hemi Restrictions Weight Bearing Restrictions: Yes    Therapy/Group: Individual Therapy  Durrell Barajas L Marialuiza Car PT, DPT  05/20/2020, 12:59 PM

## 2020-05-20 NOTE — Progress Notes (Signed)
Occupational Therapy Session Note  Patient Details  Name: Lindsey [name] MRN: [id number] Date of Birth: Apr 19, 1956  Today's Date: 05/20/2020 OT Individual Time: 1400-1510 OT Individual Time Calculation (min): 70 min    Short Term Goals: Week 3:  OT Short Term Goal 1 (Week 3): Pt will sit to stand wiht MIN A in prep for CM to for toileting OT Short Term Goal 2 (Week 3): Pt will transfer to toilet with MIN A consistently with LRAD OT Short Term Goal 3 (Week 3): Pt will maintain static standing balance for 52mn with MIN A in prep for LB dressing OT Short Term Goal 4 (Week 3): pt will thread BLE into pants with A for positioning Les only  Skilled Therapeutic Interventions/Progress Updates:    Pt received supine, no c/o pain. Pt rolling eyes at OT entering room but agreeable to session. Pt completed bed mobility to EOB with min A for RLE facilitation. Pt completed stand pivot transfer to the w/c with min A. Pt completed oral care at the sink with set up assist. Pt completed w/c propulsion with hemi method with mod A overall to maintain straight trajectory. Pt stood at elevated table top and completed facilitated RUE/LE weightbearing in standing with max facilitation overall. Mod A required for standing balance assist with LUE unsupported. Pt reported unknown pain in standing and returning to sitting after a minute. Attempted to locate pain, pt unable to tell OT or point, suspect it is shoulder with ER and weightbearing. Pt also having increased pain at end ranges of extension in the elbow and wrist. SRowe Robertwas applied to pt's R bicep to facilitate AAROM elbow flexion and HOH extension. Pt able to bring R elbow into ~25 degrees of flexion with use of saebo. Frequent cueing for visual attention provided. Pt with increased pain with PROM in the shoulder this session. Very slow PROM completed within pt's pain tolerance. Edu provided re importance of PROM for contracture prevention and maintenance of muscle  elasticity/length. Pt was brought outside for fresh air and encouragement of motivation within program and increased psychosocial adjustment. Pt returned inside and was agreeable to complete toileting before returning to supine. Pt completed stand pivot [facility] with min A. She completed clothing management with mod A for standing balance stabilization. Pt voided urine. Pt stood with grab bar use and completed peri hygiene in standing. Pt completed squat pivot transfer back to w/c with min A. Pt used slideboard to transfer back to bed, toward her R side with min A. Pt transferred back to supine with min A and was left with all needs met. Bed alarm set.    Saebo Stim One 330 pulse width 35 Hz pulse rate On 8 sec/ off 8 sec Ramp up/ down 2 sec Symmetrical Biphasic wave form  Max intensity 1155mat 500 Ohm load   Therapy Documentation Precautions:  Precautions Precautions: Fall Precaution Comments: R neglect, R hemi Restrictions Weight Bearing Restrictions: No  Therapy/Group: Individual Therapy  SaCurtis Sites/18/2021, 6:41 AM

## 2020-05-21 ENCOUNTER — Inpatient Hospital Stay (HOSPITAL_COMMUNITY): Payer: Self-pay | Admitting: Speech Pathology

## 2020-05-21 ENCOUNTER — Inpatient Hospital Stay (HOSPITAL_COMMUNITY): Payer: Self-pay

## 2020-05-21 NOTE — Progress Notes (Signed)
Speech Language Pathology Daily Session Note  Patient Details  Name: Lindsey [name] MRN: [id number] Date of Birth: 1956/07/06  Today's Date: 05/21/2020 SLP Individual Time: 1100-1200 SLP Individual Time Calculation (min): 60 min  Short Term Goals: Week 3: SLP Short Term Goal 1 (Week 3): Patient will consume current diet with minimal overt s/s of aspiration and overall supervision level verbal and visual cues for use of swallowing compensatory strategies. SLP Short Term Goal 2 (Week 3): Patient will produce simple CV and CVC combinations with Min A multimodal cues and 90% accuracy SLP Short Term Goal 3 (Week 3): Patient will answer mildly abstract yes/no questions with 90% accuracy and Min A verbal cues. SLP Short Term Goal 4 (Week 3): Patient will express basic wants/needs with use of multimodal communication with Mod A multimodal cues. SLP Short Term Goal 5 (Week 3): Patient will demonstrate selective attention in a mildly distracting enviornment for 30 minutes with Min verbal cues for redirection. SLP Short Term Goal 6 (Week 3): Patient will demonstrate functional problem solving for basic and familiar tasks with Mod verbal cues.  Skilled Therapeutic Interventions:   Skilled slp intervention focused on language. Pt occasionally produced appropriate phrases and sentences in context during session related to wants/needs. Pt responded to yes/no questions related to situations in photo scenes with 80% accuracy and min a verbal cues with repetition of question. Phrase closure task with simple CV and CVC words completed with max A verbal cues and visual models from SLP. Pt increased accuracy with specific speech sounds when cued to say words she is more familiar with (I.e. "door" to assist with producing /d/ in "dog".). Simple automatic speech task completed with min A verbal cue to begin. Pt left in bed with bed alarm set and all needs within reach.   Pain Pain Assessment Pain Scale: Faces Faces  Pain Scale: No hurt  Therapy/Group: Individual Therapy  Carlean Jews Tonie Vizcarrondo 05/21/2020, 12:37 PM

## 2020-05-21 NOTE — Progress Notes (Signed)
Occupational Therapy Session Note  Patient Details  Name: Lindsey [name] MRN: [id number] Date of Birth: 06-12-1956  Today's Date: 05/21/2020 OT Individual Time: 1300-1415 OT Individual Time Calculation (min): 75 min    Short Term Goals: Week 1:  OT Short Term Goal 1 (Week 1): Pt will locate grooming items on sink level R of midline wiht MIN A OT Short Term Goal 1 - Progress (Week 1): Met OT Short Term Goal 2 (Week 1): Pt will recall hemi dressing techniques wiht MOD cues to don shirt OT Short Term Goal 2 - Progress (Week 1): Met OT Short Term Goal 3 (Week 1): Pt will transfer to toilet wiht MOD A and LRAD OT Short Term Goal 3 - Progress (Week 1): Met OT Short Term Goal 4 (Week 1): Pt will recall washing RUE with MIN VC OT Short Term Goal 4 - Progress (Week 1): Met  Skilled Therapeutic Interventions/Progress Updates:    1:1. Pt received in bed agreeable to practice SB transfers, BADL at shower level and R NMR. Pt requries MIN A for bed mobility with hospital bed features. Pt completes SB transfer in B directions with MIN A for steadying and A to place board. Pt uses stedy to transfer into shower onto TTB with S for sit ot stand in the stedy. tp able to bathe 11/11 body parts with CGA to maintain sitting balance when washing buttocks and position RLE in figure 4 throughout session. Pt dresses with MIN A to don shirt, MIN A to position RLE into figure 4 and MOD A to advance pants past hips in standing with R knee block and bed rail on L side. Pt able to don footwear with CGA. Pt requires VC for attention to RLE position d/t decreased propioception. Pt completes seated and standing NMR for WB onto beach ball in standing rolling forward/backward and pushing into ball for deep proprioceptive input as well as shoulder AAROM in arm skate. Exited session with pt seate din bed, exit alarm on and saebo stim stimulating R deltoid to decrease risk of subluxation  Saebo Stim One 330 pulse width 35 Hz pulse  rate On 8 sec/ off 8 sec Ramp up/ down 2 sec Symmetrical Biphasic wave form  Max intensity 171m at 500 Ohm load Skin in tact after 45 min unattended e stim.   Therapy Documentation Precautions:  Precautions Precautions: Fall Precaution Comments: R neglect, R hemi Restrictions Weight Bearing Restrictions: No General:   Vital Signs:   Pain: Pain Assessment Pain Scale: Faces Faces Pain Scale: No hurt ADL: ADL Grooming: Moderate assistance Where Assessed-Grooming: Edge of bed Upper Body Bathing: Moderate assistance Where Assessed-Upper Body Bathing: Shower Lower Body Bathing: Maximal assistance Where Assessed-Lower Body Bathing: Shower Upper Body Dressing: Maximal assistance Where Assessed-Upper Body Dressing: Wheelchair Lower Body Dressing: Dependent Where Assessed-Lower Body Dressing: Wheelchair, Other (Comment) (stedy) Toileting: Dependent (stedy) Where Assessed-Toileting: Bedside Commode Toilet Transfer: Dependent Toilet Transfer Method:  (stedy) TScience writer Drop arm bedside cMicrobiologist Dependent (stedy) Vision   Perception    Praxis   Exercises:   Other Treatments:     Therapy/Group: Individual Therapy  STonny Branch8/19/2021, 1:00 PM

## 2020-05-21 NOTE — Progress Notes (Signed)
[place] PHYSICAL MEDICINE & REHABILITATION PROGRESS NOTE   Subjective/Complaints: Up in bed. No new complaints.  ROS: limited due to language/communication    Objective:   No results found. No results for input(s): WBC, HGB, HCT, PLT in the last 72 hours. No results for input(s): NA, K, CL, CO2, GLUCOSE, BUN, CREATININE, CALCIUM in the last 72 hours.  Intake/Output Summary (Last 24 hours) at 05/21/2020 1833 Last data filed at 05/21/2020 0700 Gross per 24 hour  Intake 350 ml  Output --  Net 350 ml     Physical Exam: Vital Signs Blood pressure 105/79, pulse 80, temperature 97.9 F (36.6 C), temperature source Oral, resp. rate 16, height 5\' 5"  (1.651 m), weight 74.7 kg, SpO2 100 %. Constitutional: No distress . Vital signs reviewed. HEENT: EOMI, oral membranes moist Neck: supple Cardiovascular: RRR without murmur. No JVD    Respiratory/Chest: CTA Bilaterally without wheezes or rales. Normal effort    GI/Abdomen: BS +, non-tender, non-distended Ext: no clubbing, cyanosis, or edema Psych: pleasant and cooperative Skin: Clean and intact without signs of breakdown Musculoskeletal: Full range of motion in all 4 extremities. No joint swelling seen. Right forearms still a little sensitive.  Neuro: expressive aphasia, apraxia ongoing. Right sided hemiplegia 0 to tr/5 RUE  and 1+ to 2-/5 HE, KE,  0/5 distally RLE. Left side 5/5-- sl flexor tone RUE ongoing   Assessment/Plan: 1. 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  Physiatrist is providing close team supervision and 24 hour management of active medical problems listed below.  Physiatrist and rehab team continue to assess barriers to discharge/monitor patient progress toward functional and medical goals  Care Tool:  Bathing    Body parts bathed by patient: Chest, Abdomen, Front  perineal area, Left upper leg, Face, Right upper leg, Right lower leg, Right arm, Left lower leg   Body parts bathed by helper: Left arm, Buttocks     Bathing assist Assist Level: Moderate Assistance - Patient 50 - 74%     Upper Body Dressing/Undressing Upper body dressing   What is the patient wearing?: Pull over shirt    Upper body assist Assist Level: Moderate Assistance - Patient 50 - 74%    Lower Body Dressing/Undressing Lower body dressing      What is the patient wearing?: Incontinence brief, Pants     Lower body assist Assist for lower body dressing: Moderate Assistance - Patient 50 - 74%     Toileting Toileting    Toileting assist Assist for toileting: Total Assistance - Patient < 25%     Transfers Chair/bed transfer  Transfers assist  Chair/bed transfer activity did not occur: Safety/medical concerns  Chair/bed transfer assist level: Contact Guard/Touching assist Chair/bed transfer assistive device: Sliding board   Locomotion Ambulation   Ambulation assist   Ambulation activity did not occur: Safety/medical concerns  Assist level: Minimal Assistance - Patient > 75% Assistive device: Other (comment) (L rail) Max distance: 30 ft   Walk 10 feet activity   Assist  Walk 10 feet activity did not occur: Safety/medical concerns  Assist level: Minimal Assistance - Patient > 75% Assistive device: Other (comment) (L hallway rail)   Walk 50 feet activity   Assist Walk 50 feet with 2 turns activity did not occur: Safety/medical concerns         Walk 150 feet activity   Assist Walk 150 feet  activity did not occur: Safety/medical concerns         Walk 10 feet on uneven surface  activity   Assist Walk 10 feet on uneven surfaces activity did not occur: Safety/medical concerns         Wheelchair     Assist Will patient use wheelchair at discharge?: Yes Type of Wheelchair: Manual    Wheelchair assist level: Moderate Assistance -  Patient 50 - 74% Max wheelchair distance: 150 ft    Wheelchair 50 feet with 2 turns activity    Assist        Assist Level: Moderate Assistance - Patient 50 - 74%   Wheelchair 150 feet activity     Assist      Assist Level: Moderate Assistance - Patient 50 - 74%   Blood pressure 105/79, pulse 80, temperature 97.9 F (36.6 C), temperature source Oral, resp. rate 16, height 5\' 5"  (1.651 m), weight 74.7 kg, SpO2 100 %.    Medical Problem List and Plan: 1.  R hemiplegia and impaired function/expressive aphasia and R inattention secondary to L MCA stroke with hemorrhagic extension- with dysphagia- on D2 with thins  -patient may  shower             -ELOS/Goals: 8/27, goals Mod I to min A  -Continue CIR    -have spoken with daughter re: plan, prognosis, etc 2.  Antithrombotics: -DVT/anticoagulation:  started eliquis on 8/11 per discussion with neurology             -antiplatelet therapy:  d/c'ed ASA (started eliquis)    3. Pain Management: will add low dose gabapentin 100mg  bid for RUE neuropathic pain  -tylenol prn. Well controlled 4. Mood: LCSW to follow for evaluation and support.              -antipsychotic agents: N/a 5. Neuropsych: This patient is not fully capable of making decisions on her  own behalf. 6. Skin/Wound Care: Routine pressure relief measures. IVs may be removed. 7. Fluids/Electrolytes/Nutrition: Monitor I/O. Na is 138 8/16  -prealbumin only 16.3   8/9---> 15.9   8/16   -continue remeron at HS for appetite  - po intake inconsistent but improving  -weight around 73-74kg currently 8. A fib with RVR: Monitor HR bid--continue metoprolol 75 mg bid.   -HR 70's-90's  - eliquis    Well controlled 9. HTN: Monitor BP tid.  On Lopressor 75 mg bid    Vitals:   05/21/20 0403 05/21/20 1417  BP: 117/71 105/79  Pulse: 68 80  Resp: 16 16  Temp: 98.6 F (37 C) 97.9 F (36.6 C)  SpO2: 100%   8/19--well controlled 10 Dysphagia: On Dysphagia 2, thin liquids.  Encourage intake.   -family can bring in food from home with in dietary restrictions.  11. ETOH abuse: Has not had issues with withdrawal.  12. Prediabetes: Hgb A1c-6.1.   -cbg's well controlled 13. ABLA: Hgb 11.1 8/16   LOS: 21 days A FACE TO FACE EVALUATION WAS PERFORMED  05/23/20 05/21/2020, 6:33 PM

## 2020-05-21 NOTE — Progress Notes (Addendum)
Physical Therapy Weekly Progress Note  Patient Details  Name: Lindsey [name] MRN: [id number] Date of Birth: 1956/06/29  Beginning of progress report period: May 14, 2020 End of progress report period: May 21, 2020  Today's Date: 05/21/2020 PT Individual Time: 1010-1105 PT Individual Time Calculation (min): 55 min   Patient has met 2 of 3 short term goals.  Patient with steady progress this week. Continues to be limited by R hemiplegia with decreased R attention and motor planning. She has demonstrated improved quad, hamstring, and gluteal activation in the R LE this week, continues to lack DF/PF and has poor R knee control in standing and gait. She currently requires min A for bed mobility, min A-CGA and set-up assist for slide board transfers, min-mod for stand and squat pivot transfers, min-mod A gait 30 ft at L rail, and min-mod A w/c mobility.   Patient continues to demonstrate the following deficits muscle weakness, decreased cardiorespiratoy endurance, impaired timing and sequencing, abnormal tone, unbalanced muscle activation and decreased motor planning, decreased midline orientation, decreased attention to right and decreased motor planning and decreased sitting balance, decreased standing balance, decreased postural control, hemiplegia and decreased balance strategies and therefore will continue to benefit from skilled PT intervention to increase functional independence with mobility.  Patient progressing toward long term goals..  Plan of care revisions: Downgraded w/c goals and d/c home ambulation goals due to slow progress. See Care Plan for details..  PT Short Term Goals Week 3:  PT Short Term Goal 1 (Week 3): Patient will perform basic transfers with min A. PT Short Term Goal 1 - Progress (Week 3): Met PT Short Term Goal 2 (Week 3): Patient will ambulate 50 feet with max A and LRAD. PT Short Term Goal 2 - Progress (Week 3): Progressing toward goal PT Short Term Goal 3 (Week  3): Patient will perform standing balance >5 min with min A with L UE support. PT Short Term Goal 3 - Progress (Week 3): Met Week 4:  PT Short Term Goal 1 (Week 4): STG=LTG due to ELOS.  Skilled Therapeutic Interventions/Progress Updates:     Patient in bed upon PT arrival. Patient alert and agreeable to PT session. Patient denied pain during session.  Therapeutic Activity: Bed Mobility: Patient performed supine to/from sit with min A for trunk and R LE management. Provided verbal cues for sequencing and use of R UE to push up from R side-lying. Seated EOB with supervision for sitting balance, patient donned shorts with max A and mod A for standing balance without AD while pulling shorts over hips. She doffed her shirt with min A and max cues for hemi-technique, donned a bra and shirt with mod-max A due to R UE management, donned B tennis shoes with max A with patient lifting B feet and pushing her feet in with cues for the R LE.  Transfers: Patient performed lateral scoot transfers bed<>w/c with min A to facilitate forward weight shift and head-hips relationship. Provided cues for hand placement and head-hips relationship for proper technique and decreased assist with transfers.  Patient performed sit to/from stand x5 in the // bars with min A-CGA using L UE on bar. Provided verbal cues for hand placement, R knee control with R quad and gluteal activation to stand.  Neuromuscular Re-ed: Patient performed the following standing balance and R UE/LE motor control activities in the // bars with facilitation of R hand on bar: -R knee terminal extension with green band behind knee for resistance into extension -  mini-squats x12 with PT blocking R knee extension for resistance into extension -standing balance 3 min 22 sec with intermittent B weight shifts focused on R knee control with tactile feedback to prevent knee buckling and hyperextension, trunk and pelvic symmetry with mirror for visual feedback,  and R UE grip and stabilization with progressing to only R UE support -attempted step tap on 6" step with R UE x2, but patient reported increased fatigue and spontaneously returned to sitting  Patient in bed handed off to NT for bladder scan at end of session.    Therapy Documentation Precautions:  Precautions Precautions: Fall Precaution Comments: R neglect, R hemi Restrictions Weight Bearing Restrictions: No   Therapy/Group: Individual Therapy  Kaysan Peixoto L December Hedtke PT, DPT  05/21/2020, 11:05 AM

## 2020-05-22 ENCOUNTER — Ambulatory Visit (HOSPITAL_COMMUNITY): Payer: Self-pay | Admitting: Physical Therapy

## 2020-05-22 ENCOUNTER — Encounter (HOSPITAL_COMMUNITY): Payer: Self-pay | Admitting: Speech Pathology

## 2020-05-22 ENCOUNTER — Inpatient Hospital Stay (HOSPITAL_COMMUNITY): Payer: Self-pay

## 2020-05-22 ENCOUNTER — Encounter (HOSPITAL_COMMUNITY): Payer: Self-pay

## 2020-05-22 NOTE — Progress Notes (Signed)
Occupational Therapy Session Note  Patient Details  Name: Lindsey [name] MRN: [id number] Date of Birth: Jan 02, 1956  Today's Date: 05/22/2020 OT Individual Time: 1300-1345 OT Individual Time Calculation (min): 45 min    Short Term Goals: Week 1:  OT Short Term Goal 1 (Week 1): Pt will locate grooming items on sink level R of midline wiht MIN A OT Short Term Goal 1 - Progress (Week 1): Met OT Short Term Goal 2 (Week 1): Pt will recall hemi dressing techniques wiht MOD cues to don shirt OT Short Term Goal 2 - Progress (Week 1): Met OT Short Term Goal 3 (Week 1): Pt will transfer to toilet wiht MOD A and LRAD OT Short Term Goal 3 - Progress (Week 1): Met OT Short Term Goal 4 (Week 1): Pt will recall washing RUE with MIN VC OT Short Term Goal 4 - Progress (Week 1): Met  Skilled Therapeutic Interventions/Progress Updates:    1:1. Pt received in bed agreeable to OT. Pt requires encouragement to get OOB. Pt completes squat pivot transfers EOB>w/c>EOM with MIN A and VC for hand placement. Pt completes supine and sidelying NMR for RUE with mirror for visual feedback and OT providing scapular mobilizations for pro/retraction during movements. Pt with intermittent trace elbow flexion/extension and shoulder flexion activation with multimodal cuing. Pt resides in w/c with half lap tray on and estim on wrist extensors for 60 min unattended estim   Saebo Stim One 330 pulse width 35 Hz pulse rate On 8 sec/ off 8 sec Ramp up/ down 2 sec Symmetrical Biphasic wave form  Max intensity 15m at 500 Ohm load Skin in tact at end of stim session. Pt tol well  Therapy Documentation Precautions:  Precautions Precautions: Fall Precaution Comments: R neglect, R hemi Restrictions Weight Bearing Restrictions: No General:   Vital Signs: Therapy Vitals Temp: 97.8 F (36.6 C) Pulse Rate: 78 Resp: 17 BP: 114/71 Patient Position (if appropriate): Sitting Oxygen Therapy SpO2: 100 % O2 Device: Room  Air Pain:   ADL: ADL Grooming: Moderate assistance Where Assessed-Grooming: Edge of bed Upper Body Bathing: Moderate assistance Where Assessed-Upper Body Bathing: Shower Lower Body Bathing: Maximal assistance Where Assessed-Lower Body Bathing: Shower Upper Body Dressing: Maximal assistance Where Assessed-Upper Body Dressing: Wheelchair Lower Body Dressing: Dependent Where Assessed-Lower Body Dressing: Wheelchair, Other (Comment) (stedy) Toileting: Dependent (stedy) Where Assessed-Toileting: Bedside Commode Toilet Transfer: Dependent Toilet Transfer Method:  (stedy) TScience writer Drop arm bedside cMicrobiologist Dependent (stedy) Vision   Perception    Praxis   Exercises:   Other Treatments:     Therapy/Group: Individual Therapy  STonny Branch8/20/2021, 1:43 PM

## 2020-05-22 NOTE — Progress Notes (Signed)
Speech Language Pathology Weekly Progress and Session Note  Patient Details  Name: Lindsey [name] MRN: [id number] Date of Birth: 02-Oct-1956  Beginning of progress report period: May 15, 2020 End of progress report period: May 22, 2020  Today's Date: 05/22/2020 SLP Individual Time: 6503-5465 SLP Individual Time Calculation (min): 55 min  Short Term Goals: Week 3: SLP Short Term Goal 1 (Week 3): Patient will consume current diet with minimal overt s/s of aspiration and overall supervision level verbal and visual cues for use of swallowing compensatory strategies. SLP Short Term Goal 1 - Progress (Week 3): Not met SLP Short Term Goal 2 (Week 3): Patient will produce simple CV and CVC combinations with Min A multimodal cues and 90% accuracy SLP Short Term Goal 2 - Progress (Week 3): Not met SLP Short Term Goal 3 (Week 3): Patient will answer mildly abstract yes/no questions with 90% accuracy and Min A verbal cues. SLP Short Term Goal 3 - Progress (Week 3): Not met SLP Short Term Goal 4 (Week 3): Patient will express basic wants/needs with use of multimodal communication with Mod A multimodal cues. SLP Short Term Goal 4 - Progress (Week 3): Met SLP Short Term Goal 5 (Week 3): Patient will demonstrate selective attention in a mildly distracting enviornment for 30 minutes with Min verbal cues for redirection. SLP Short Term Goal 5 - Progress (Week 3): Met SLP Short Term Goal 6 (Week 3): Patient will demonstrate functional problem solving for basic and familiar tasks with Mod verbal cues. SLP Short Term Goal 6 - Progress (Week 3): Met    New Short Term Goals: Week 4: SLP Short Term Goal 1 (Week 4): STGs=LTGs due to ELOS  Weekly Progress Updates: Patient continues to make functional gains and has met 3 of 6 STGs this reporting period.  Currently, patient is consuming Dys. 2 textures with thin liquids with Min verbal cues for use of swallowing compensatory strategies. Trials of upgraded  textures with be addressed this reporting period. Patient demonstrates improved functional communication with increased ability to express wants/needs with multimodal communication and increased ability for spontaneous verbalizations and verbalizing at the word and phrase level during structured speech tasks. Patient has had increased participation and engagement this reporting period and requires overall Mod A multimodal cues for problem solving and attention with functional and familiar tasks. Patient and family education ongoing. Patient would benefit from continued skilled SLP intervention to maximize her cognitive, swallowing and communication function prior to discharge.    Intensity: Minumum of 1-2 x/day, 30 to 90 minutes Frequency: 3 to 5 out of 7 days Duration/Length of Stay: 05/29/20 Treatment/Interventions: Cognitive remediation/compensation;Cueing hierarchy;Functional tasks;Patient/family education;Speech/Language facilitation;Dysphagia/aspiration precaution training;Internal/external aids;Environmental controls;Therapeutic Activities   Daily Session  Skilled Therapeutic Interventions: Skilled treatment session focused on family education. SLP facilitated session by providing education regarding patient's current functional communication and strategies to utilize to maximize verbal expression and minimize phonemic errors. All were educated on importance of allowing patient time for communication and utilizing articulatory and sentence completion cues. All verbalized understanding of education. Patient required Max A multimodal cues to self-monitor and correct phonemic errors during a structured phrase completion task. At end of session, patient requested to use the bathroom and was transferred to the commode via the [place].  Patient was continent of bowel and bladder. Patient left upright in wheelchair with alarm on and all needs within reach. Continue with current plan of care.      Pain No/Denies Pain   Therapy/Group: Individual Therapy  Emelin Dascenzo,  Vana Arif 05/22/2020, 6:33 AM

## 2020-05-22 NOTE — Progress Notes (Signed)
[facility] PHYSICAL MEDICINE & REHABILITATION PROGRESS NOTE   Subjective/Complaints: No new complaints. Seems to be in good spirits  ROS: limited due to language/communication   Objective:   No results found. No results for input(s): WBC, HGB, HCT, PLT in the last 72 hours. No results for input(s): NA, K, CL, CO2, GLUCOSE, BUN, CREATININE, CALCIUM in the last 72 hours.  Intake/Output Summary (Last 24 hours) at 05/22/2020 1050 Last data filed at 05/21/2020 2100 Gross per 24 hour  Intake 120 ml  Output --  Net 120 ml     Physical Exam: Vital Signs Blood pressure 115/79, pulse 84, temperature 98.2 F (36.8 C), temperature source Oral, resp. rate 16, height 5\' 5"  (1.651 m), weight 75.8 kg, SpO2 100 %. Constitutional: No distress . Vital signs reviewed. HEENT: EOMI, oral membranes moist Neck: supple Cardiovascular: RRR without murmur. No JVD    Respiratory/Chest: CTA Bilaterally without wheezes or rales. Normal effort    GI/Abdomen: BS +, non-tender, non-distended Ext: no clubbing, cyanosis, or edema Psych: pleasant and cooperative Skin: Clean and intact without signs of breakdown Musculoskeletal: Full range of motion in all 4 extremities. No joint swelling seen. Right forearms still a little sensitive.  Neuro: expressive aphasia, apraxia. Right sided hemiplegia 0 to tr/5 RUE  and 1+ to 2-/5 HE, KE,  0/5 distally RLE. Left side 5/5-- sl flexor tone RUE ongoing   Assessment/Plan: 1. 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  Physiatrist is providing close team supervision and 24 hour management of active medical problems listed below.  Physiatrist and rehab team continue to assess barriers to discharge/monitor patient progress toward functional and medical goals  Care Tool:  Bathing    Body parts bathed by patient: Chest, Abdomen, Front  perineal area, Left upper leg, Face, Right upper leg, Right lower leg, Right arm, Left lower leg   Body parts bathed by helper: Left arm, Buttocks     Bathing assist Assist Level: Moderate Assistance - Patient 50 - 74%     Upper Body Dressing/Undressing Upper body dressing   What is the patient wearing?: Pull over shirt    Upper body assist Assist Level: Moderate Assistance - Patient 50 - 74%    Lower Body Dressing/Undressing Lower body dressing      What is the patient wearing?: Incontinence brief, Pants     Lower body assist Assist for lower body dressing: Moderate Assistance - Patient 50 - 74%     Toileting Toileting    Toileting assist Assist for toileting: Total Assistance - Patient < 25%     Transfers Chair/bed transfer  Transfers assist  Chair/bed transfer activity did not occur: Safety/medical concerns  Chair/bed transfer assist level: Minimal Assistance - Patient > 75% Chair/bed transfer assistive device: Armrests   Locomotion Ambulation   Ambulation assist   Ambulation activity did not occur: Safety/medical concerns  Assist level: Minimal Assistance - Patient > 75% Assistive device: Other (comment) (L rail) Max distance: 30 ft   Walk 10 feet activity   Assist  Walk 10 feet activity did not occur: Safety/medical concerns  Assist level: Minimal Assistance - Patient > 75% Assistive device: Other (comment) (L hallway rail)   Walk 50 feet activity   Assist Walk 50 feet with 2 turns activity did not occur: Safety/medical concerns         Walk 150 feet activity   Assist  Walk 150 feet activity did not occur: Safety/medical concerns         Walk 10 feet on uneven surface  activity   Assist Walk 10 feet on uneven surfaces activity did not occur: Safety/medical concerns         Wheelchair     Assist Will patient use wheelchair at discharge?: Yes Type of Wheelchair: Manual    Wheelchair assist level: Moderate Assistance -  Patient 50 - 74% Max wheelchair distance: 150 ft    Wheelchair 50 feet with 2 turns activity    Assist        Assist Level: Moderate Assistance - Patient 50 - 74%   Wheelchair 150 feet activity     Assist      Assist Level: Moderate Assistance - Patient 50 - 74%   Blood pressure 115/79, pulse 84, temperature 98.2 F (36.8 C), temperature source Oral, resp. rate 16, height 5\' 5"  (1.651 m), weight 75.8 kg, SpO2 100 %.    Medical Problem List and Plan: 1.  R hemiplegia and impaired function/expressive aphasia and R inattention secondary to L MCA stroke with hemorrhagic extension- with dysphagia- on D2 with thins  -patient may  shower             -ELOS/Goals: 8/27, goals Mod I to min A  -Continue CIR    -have spoken with daughter re: plan, prognosis, etc 2.  Antithrombotics: -DVT/anticoagulation:  started eliquis on 8/11 per discussion with neurology             -antiplatelet therapy:  d/c'ed ASA (started eliquis)    3. Pain Management: will add low dose gabapentin 100mg  bid for RUE neuropathic pain  -tylenol prn. Well controlled 4. Mood: LCSW to follow for evaluation and support.              -antipsychotic agents: N/a 5. Neuropsych: This patient is not fully capable of making decisions on her  own behalf. 6. Skin/Wound Care: Routine pressure relief measures. IVs may be removed. 7. Fluids/Electrolytes/Nutrition: Monitor I/O. Na is 138 8/16  -prealbumin only 16.3   8/9---> 15.9   8/16   -continue remeron at HS for appetite  - po intake inconsistent but improving, family bringing food in too  -weight up to 76kg today 8. A fib with RVR: Monitor HR bid--continue metoprolol 75 mg bid.   -HR 70's-90's  - eliquis    Well controlled 9. HTN: Monitor BP tid.  On Lopressor 75 mg bid    Vitals:   05/21/20 1945 05/22/20 0608  BP: 109/76 115/79  Pulse: 70 84  Resp: 16 16  Temp: 98.8 F (37.1 C) 98.2 F (36.8 C)  SpO2: 99% 100%  8/20--well controlled 10 Dysphagia: On  Dysphagia 2, thin liquids. Encourage intake.   -family can bring in food from home with in dietary restrictions.  11. ETOH abuse: Has not had issues with withdrawal.  12. Prediabetes: Hgb A1c-6.1.   -cbg's well controlled 13. ABLA: Hgb 11.1 8/16   LOS: 22 days A FACE TO FACE EVALUATION WAS PERFORMED  05/23/20 05/22/2020, 10:50 AM

## 2020-05-22 NOTE — Progress Notes (Signed)
Physical Therapy Session Note  Patient Details  Name: Chyna Kneece MRN: [id number] Date of Birth: 1956/08/03  Today's Date: 05/22/2020 PT Individual Time: 0805-0902 PT Individual Time Calculation (min): 57 min   Short Term Goals: Week 4:  PT Short Term Goal 1 (Week 4): STG=LTG due to ELOS.  Skilled Therapeutic Interventions/Progress Updates:    Pt received supine in bed with NT present and pt finishing breakfast - pt agreeable to therapy session so therapist assumed care of pt. Pt's daughter arrived then shortly followed by pt's friend and grandson to all participate in hands-on family training and education. Therapist educated pt's family on pt's CLOF, R inattention, and R hemiparesis with importance of positioning during mobility tasks. Therapist provided pt's family with home measurement sheet and educated on importance of ensuring wheelchair will fit through doorways and pathways in home and to measure height of different surfaces (ex: bed and car seat) in planning for transfers - pt's family planning to return sheet on Monday 8/23 during next [facility] training. In supine, donned shorts with max assist for threading LEs and pulling up over hips for time management. Therapist discussed benefits of hospital bed at home to allow [facility] elevation feature in event pt needs to eat/drink in the bed due to swallowing precautions otherwise educated on need for pt to be sitting upright either EOB with their assist or sitting in w/c - pt's daughter planning to provide bed height measurements to help decide if hospital bed is needed. Therapist educated pt's family on providing pt with min assist for supine<>sit and assist for managing R hemibody - pt's grandson demonstrated understanding and other family members report feeling confident. Therapist educated on wheelchair parts (brakes, leg rest, and armrests) as well as how to fold up chair to place in/out of vehicle. Therapist educated on proper set-up and assistance  for slide board transfers EOB<>w/c - demonstrated L lateral scoot transfer EOB>w/c using slide board with total assist for board placement and min assist for scooting. Pt's family then completed block practice transfers w/c<>EOB using slide board with therapist providing cuing throughout for improved R hemibody management, correct board placement, proper assistance, and set-up of transfer - pt's family demos great safety awareness and great initial learning session but believe they would benefit from additional practice, specifically with transfers going towards the R. Due to time constraints and to allow all 3 family members to perform hands-on assist slide board transfers unable to train on car transfers - therapist recommended that pt's family bring their sedan on Monday to perform real car transfer with primary PT - family in agreement. Pt's family reports no questions at this time - pt left seated EOB as hand-off to Ze[place], OT and therapist informed her of what was covered during training.  Therapy Documentation Precautions:  Precautions Precautions: Fall Precaution Comments: R neglect, R hemi Restrictions Weight Bearing Restrictions: No  Pain:   No reports of pain throughout session.   Therapy/Group: Individual Therapy  Ginny Forth , PT, DPT, CSRS  05/22/2020, 7:48 AM

## 2020-05-22 NOTE — Progress Notes (Signed)
Occupational Therapy Weekly Progress Note  Patient Details  Name: Lindsey [name] MRN: [id number] Date of Birth: [birth year]  Beginning of progress report period: May 15, 2020 End of progress report period: May 22, 2020  Today's Date: 05/22/2020 OT Individual Time: 0900-0930 OT Individual Time Calculation (min): 30 min    Patient has met 4 of 4 short term goals.  Pt has made good progress this reporting period despite varying levels of participation. Pt is min A for sit to stands with R knee block, up to mOD A for standing balance for pt to advance pants past hips during toileting, CGA for functional transfers to [facility] and as little MIN A for dressing UB/LB overall. Pt has trace activation in biceps/triceps and pec on R, however dense hemiplegia and R inattention impacting functional use of RUE during ADLs  Patient continues to demonstrate the following deficits: muscle weakness, decreased cardiorespiratoy endurance, impaired timing and sequencing, abnormal tone, unbalanced muscle activation, motor apraxia and decreased coordination, decreased visual perceptual skills, decreased attention to right and decreased motor planning, decreased initiation, decreased attention, decreased awareness, decreased problem solving, decreased safety awareness, decreased memory and delayed processing and decreased sitting balance, decreased standing balance, decreased postural control, hemiplegia and decreased balance strategies and therefore will continue to benefit from skilled OT intervention to enhance overall performance with BADL and iADL.  Patient progressing toward long term goals..  Continue plan of care.  OT Short Term Goals Week 3:  OT Short Term Goal 1 (Week 3): Pt will sit to stand wiht MIN A in prep for CM to for toileting OT Short Term Goal 1 - Progress (Week 3): Met OT Short Term Goal 2 (Week 3): Pt will transfer to toilet with MIN A consistently with LRAD OT Short Term Goal 2 - Progress (Week  3): Met OT Short Term Goal 3 (Week 3): Pt will maintain static standing balance for 10mn with MIN A in prep for LB dressing OT Short Term Goal 3 - Progress (Week 3): Met OT Short Term Goal 4 (Week 3): pt will thread BLE into pants with A for positioning Les only OT Short Term Goal 4 - Progress (Week 3): Met Week 4:  OT Short Term Goal 1 (Week 4): STG=LTG d/t ELOS  Skilled Therapeutic Interventions/Progress Updates:    1:1. Pt daughter, grandson and friend present for family educaiton. Education provided on STS, SB transfers[facility], standing balance, weight shifting, body mechanics, NMR, estim, and R inattention. OT and pt demo SB transfer [facility]OB<>DAC [facility] and family practice each SB transfer and sit to stand 2x with good carry over of strategies and min coaching on functional communication and cuing pt on hand placement. Exited session with pt seated in w/c and SLP in room. Family would benefit from more practice to demo hemi dressing and full toileting routine  Therapy Documentation Precautions:  Precautions Precautions: Fall Precaution Comments: R neglect, R hemi Restrictions Weight Bearing Restrictions: No General:   Vital Signs: Therapy Vitals Temp: 98.2 F (36.8 C) Temp Source: Oral Pulse Rate: 84 Resp: 16 BP: 115/79 Patient Position (if appropriate): Lying Oxygen Therapy SpO2: 100 % O2 Device: Room Air Pain:   ADL: ADL Grooming: Moderate assistance Where Assessed-Grooming: Edge of bed Upper Body Bathing: Moderate assistance Where Assessed-Upper Body Bathing: Shower Lower Body Bathing: Maximal assistance Where Assessed-Lower Body Bathing: Shower Upper Body Dressing: Maximal assistance Where Assessed-Upper Body Dressing: Wheelchair Lower Body Dressing: Dependent Where Assessed-Lower Body Dressing: Wheelchair, Other (  Comment) (stedy) Toileting: Dependent (stedy) Where Assessed-Toileting: Bedside Commode Toilet Transfer:  Dependent Toilet Transfer Method:  (stedy) Toilet Transfer Equipment: Drop arm bedside commode Social research officer, government: Dependent (stedy) Vision   Perception    Praxis   Exercises:   Other Treatments:     Therapy/Group: Individual Therapy  Tonny Branch 05/22/2020, 6:40 AM

## 2020-05-23 ENCOUNTER — Inpatient Hospital Stay (HOSPITAL_COMMUNITY): Payer: Self-pay

## 2020-05-23 ENCOUNTER — Inpatient Hospital Stay (HOSPITAL_COMMUNITY): Payer: Self-pay | Admitting: Physical Therapy

## 2020-05-23 ENCOUNTER — Encounter (HOSPITAL_COMMUNITY): Payer: Self-pay | Admitting: Occupational Therapy

## 2020-05-23 MED ORDER — GABAPENTIN 100 MG PO CAPS
100.0000 mg | ORAL_CAPSULE | Freq: Two times a day (BID) | ORAL | Status: DC
  Administered 2020-05-23 – 2020-05-29 (×12): 100 mg via ORAL
  Filled 2020-05-23 (×12): qty 1

## 2020-05-23 NOTE — Progress Notes (Signed)
Physical Therapy Session Note  Patient Details  Name: Lindsey [name] MRN: [id number] Date of Birth: 06-Sep-1956  Today's Date: 05/23/2020 PT Individual Time: 0935-1050 PT Individual Time Calculation (min): 75 min   Short Term Goals: Week 1:  PT Short Term Goal 1 (Week 1): Pt will complete bed<>w/c transfers with min assist. PT Short Term Goal 1 - Progress (Week 1): Progressing toward goal PT Short Term Goal 2 (Week 1): Pt will ambulate 30 ft with LRAD & max assist. PT Short Term Goal 2 - Progress (Week 1): Met PT Short Term Goal 3 (Week 1): Pt will propel w/c 75 ft with min assist. PT Short Term Goal 3 - Progress (Week 1): Met PT Short Term Goal 4 (Week 1): Pt will complete bed mobility with min assist. PT Short Term Goal 4 - Progress (Week 1): Progressing toward goal Week 2:  PT Short Term Goal 1 (Week 2): Patient will perform bed mobility with min A. PT Short Term Goal 1 - Progress (Week 2): Met PT Short Term Goal 2 (Week 2): Patient will perform basic transfers with min A. PT Short Term Goal 2 - Progress (Week 2): Progressing toward goal PT Short Term Goal 3 (Week 2): Patient will ambulate 50 feet with max A and LRAD. PT Short Term Goal 3 - Progress (Week 2): Progressing toward goal PT Short Term Goal 4 (Week 2): Patient will perform standing balance >5 min with min A with L UE support. PT Short Term Goal 4 - Progress (Week 2): Progressing toward goal Week 3:  PT Short Term Goal 1 (Week 3): Patient will perform basic transfers with min A. PT Short Term Goal 1 - Progress (Week 3): Met PT Short Term Goal 2 (Week 3): Patient will ambulate 50 feet with max A and LRAD. PT Short Term Goal 2 - Progress (Week 3): Progressing toward goal PT Short Term Goal 3 (Week 3): Patient will perform standing balance >5 min with min A with L UE support. PT Short Term Goal 3 - Progress (Week 3): Met Week 4:  PT Short Term Goal 1 (Week 4): STG=LTG due to ELOS.  Skilled Therapeutic Interventions/Progress  Updates:    pt received in bed and agreeable to therapy. Pt denied pain at start and end of session. Pt directed In donning pants in supine, mod A with PT assisting with RLE, pant able to thread LLE in pants and pull to thigh. Pt transferred supine>sit min A, B shoes donned total A, squat pivot to WC min A with pt given VC for safety awareness with pt demonstrating slight impulsivity to reach for armrest and pull self into seat. Pt taken to grab bar to assist in standing, CGA with use of grab bar to stand, mod A to pull pants the rest of the way on in standing. Pt taken to gym in [facility] total A for time management. Pt directed In gait training parallel bars min A with AFO on RLE to prevent knee buckling, good effect noted. Pt taken into hall for rail use with LUE mod A initially, frequent R knee buckling noted however with VC, extra time and manual facilitation for trunk extension, step length, knee extension, and preventing adduction from RLE pt improved to min A and without need of these manual assistance, 30' x3 completed in total with rest break between and final rep, pt improved to min A for stability but much better stepping pattern noted with step length, height, and no adduction noted, only VC for  foot alignment and trunk extension. Pt directed in NMRE with LUE for support at table for standing activity for increasing attention to R. Pt given 2x10 objects at right side of table to reach cross midline to grab and place at targeted area, max VC to complete and extra time but pt able to complete, intermittent R knee blocking noted. Pt returned to room in [facility], total A for time management, left in [name], alarm belt set, All needs in reach and in good condition. Call light in hand.    Therapy Documentation Precautions:  Precautions Precautions: Fall Precaution Comments: R neglect, R hemi Restrictions Weight Bearing Restrictions: No    Therapy/Group: Individual Therapy  Junie Panning 05/23/2020, 10:53 AM

## 2020-05-23 NOTE — Progress Notes (Signed)
Occupational Therapy Session Note  Patient Details  Name: Lindsey [name] MRN: [id number] Date of Birth: 28-Dec-1955  Today's Date: 05/23/2020 OT Group Time:  - 60 minutes missed    Skilled Therapeutic Interventions/Progress Updates:    Pt seen for scheduled group tx with pt adamantly refusing to attend. Time missed due to refusal.    Therapy Documentation Precautions:  Precautions Precautions: Fall Precaution Comments: R neglect, R hemi Restrictions Weight Bearing Restrictions: No Vital Signs: Therapy Vitals Temp: 98.2 F (36.8 C) Pulse Rate: 86 Resp: 16 BP: 100/79 Patient Position (if appropriate): Lying Oxygen Therapy SpO2: 98 % O2 Device: Room Air ADL: ADL Grooming: Moderate assistance Where Assessed-Grooming: Edge of bed Upper Body Bathing: Moderate assistance Where Assessed-Upper Body Bathing: Shower Lower Body Bathing: Maximal assistance Where Assessed-Lower Body Bathing: Shower Upper Body Dressing: Maximal assistance Where Assessed-Upper Body Dressing: Wheelchair Lower Body Dressing: Dependent Where Assessed-Lower Body Dressing: Wheelchair, Other (Comment) (stedy) Toileting: Dependent (stedy) Where Assessed-Toileting: Bedside Commode Toilet Transfer: Dependent Toilet Transfer Method:  (stedy) Toilet Transfer Equipment: Drop arm bedside Corporate investment banker: Dependent (stedy)     Therapy/Group: Group Therapy  Angelyn Osterberg A Estel Scholze 05/23/2020, 7:23 AM

## 2020-05-23 NOTE — Progress Notes (Signed)
Occupational Therapy Session Note  Patient Details  Name: Lindsey [name] MRN: [id number] Date of Birth: [birth year]  Today's Date: 05/23/2020 OT Missed Time: 60 Minutes Missed Time Reason: Patient unwilling/refused to participate without medical reason   Short Term Goals: Week 4:  OT Short Term Goal 1 (Week 4): STG=LTG d/t ELOS  Skilled Therapeutic Interventions/Progress Updates:    Pt supine upon entry, adamantly shaking head no when seeing OT at the doorway. OT offered several options, including bed level PROM- pt declined all. Pt was agreeable to OT placing saebo on R shoulder. Mellissa Kohut was left on for 60 min unattended e-stim, no adverse skin reaction or pain, used to approximate shoulder joint. 60 min skilled OT missed.   Saebo Stim One 330 pulse width 35 Hz pulse rate On 8 sec/ off 8 sec Ramp up/ down 2 sec Symmetrical Biphasic wave form  Max intensity at 500 Ohm load   Therapy Documentation Precautions:  Precautions Precautions: Fall Precaution Comments: R neglect, R hemi Restrictions Weight Bearing Restrictions: No Therapy/Group: Individual Therapy  Crissie Reese 05/23/2020, 6:28 AM

## 2020-05-24 ENCOUNTER — Inpatient Hospital Stay (HOSPITAL_COMMUNITY): Payer: Self-pay | Admitting: Speech Pathology

## 2020-05-24 NOTE — Progress Notes (Signed)
Speech Language Pathology Daily Session Note  Patient Details  Name: Lindsey [name] MRN: [id number] Date of Birth: Feb 13, 1956  Today's Date: 05/24/2020 SLP Individual Time: 7989-2119 SLP Individual Time Calculation (min): 40 min  Short Term Goals: Week 4: SLP Short Term Goal 1 (Week 4): STGs=LTGs due to ELOS  Skilled Therapeutic Interventions:  Pt was seen for skilled ST targeting goals for dysphagia and communication.  Pt indicating that she wanted to get out of bed via gestures and yes/no responses to questions upon therapist's arrival.  Pt was transferred to wheelchair via [place].  SLP facilitated the session with a trial snack of dys 3 textures to continue working towards diet progression.  Pt consumed advanced solids with more than a reasonable amount of time and supervision verbal cues to clear buccal residue.  Pt exhibited no overt s/s of aspiration with solids or liquids in isolation; however, when using a liquid wash to clear oral residue, pt had strong delayed reflexive coughing x2.  Pt appears to be progressing well towards diet advancement but will defer ultimate recommendations to pt's primary SLP.  Pt required max assist multimodal cues to name pictures of basic, familiar objects during phrase closure tasks due to perseveration, addition of extra phonemes at the end of targeted words, and initial phoneme substitutions.  Pt was left in wheelchair with chair alarm set and call bell within reach.  Continue per current plan of care.    Pain Pain Assessment Pain Scale: 0-10 Pain Score: 0-No pain  Therapy/Group: Individual Therapy  Ajai Terhaar, Melanee Spry 05/24/2020, 12:33 PM

## 2020-05-24 NOTE — Progress Notes (Addendum)
[place] PHYSICAL MEDICINE & REHABILITATION PROGRESS NOTE   Subjective/Complaints: No new problems. Doesn't appear to be in pain  ROS: limited by language   Objective:   No results found. No results for input(s): WBC, HGB, HCT, PLT in the last 72 hours. No results for input(s): NA, K, CL, CO2, GLUCOSE, BUN, CREATININE, CALCIUM in the last 72 hours.  Intake/Output Summary (Last 24 hours) at 05/24/2020 1204 Last data filed at 05/24/2020 0900 Gross per 24 hour  Intake 286 ml  Output --  Net 286 ml     Physical Exam: Vital Signs Blood pressure 112/70, pulse 69, temperature 98.5 F (36.9 C), resp. rate 17, height 5\' 5"  (1.651 m), weight 76 kg, SpO2 100 %. Constitutional: No distress . Vital signs reviewed. HEENT: EOMI, oral membranes moist Neck: supple Cardiovascular: RRR without murmur. No JVD    Respiratory/Chest: CTA Bilaterally without wheezes or rales. Normal effort    GI/Abdomen: BS +, non-tender, non-distended Ext: no clubbing, cyanosis, or edema Psych: pleasant and cooperative Skin: Clean and intact without signs of breakdown Musculoskeletal: Full range of motion in all 4 extremities. No joint swelling seen. Right forearms still a little sensitive.  Neuro: expressive aphasia, apraxia. Right sided hemiplegia 0 to tr/5 RUE  and 1+ to 2-/5 HE, KE,  0/5 distally RLE. Left side 5/5-- sl flexor tone RUE ongoing   Assessment/Plan: 1. 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  Physiatrist is providing close team supervision and 24 hour management of active medical problems listed below.  Physiatrist and rehab team continue to assess barriers to discharge/monitor patient progress toward functional and medical goals  Care Tool:  Bathing    Body parts bathed by patient: Chest, Abdomen, Front perineal area, Left upper leg, Face, Right  upper leg, Right lower leg, Right arm, Left lower leg   Body parts bathed by helper: Left arm, Buttocks     Bathing assist Assist Level: Moderate Assistance - Patient 50 - 74%     Upper Body Dressing/Undressing Upper body dressing   What is the patient wearing?: Pull over shirt    Upper body assist Assist Level: Moderate Assistance - Patient 50 - 74%    Lower Body Dressing/Undressing Lower body dressing      What is the patient wearing?: Incontinence brief, Pants     Lower body assist Assist for lower body dressing: Moderate Assistance - Patient 50 - 74%     Toileting Toileting    Toileting assist Assist for toileting: Total Assistance - Patient < 25%     Transfers Chair/bed transfer  Transfers assist  Chair/bed transfer activity did not occur: Safety/medical concerns  Chair/bed transfer assist level: Minimal Assistance - Patient > 75% (slide board) Chair/bed transfer assistive device: Armrests   Locomotion Ambulation   Ambulation assist   Ambulation activity did not occur: Safety/medical concerns  Assist level: Minimal Assistance - Patient > 75% Assistive device: Other (comment) (L rail) Max distance: 30 ft   Walk 10 feet activity   Assist  Walk 10 feet activity did not occur: Safety/medical concerns  Assist level: Minimal Assistance - Patient > 75% Assistive device: Other (comment) (L hallway rail)   Walk 50 feet activity   Assist Walk 50 feet with 2 turns activity did not occur: Safety/medical concerns         Walk 150 feet activity   Assist Walk 150  feet activity did not occur: Safety/medical concerns         Walk 10 feet on uneven surface  activity   Assist Walk 10 feet on uneven surfaces activity did not occur: Safety/medical concerns         Wheelchair     Assist Will patient use wheelchair at discharge?: Yes Type of Wheelchair: Manual    Wheelchair assist level: Moderate Assistance - Patient 50 - 74% Max  wheelchair distance: 150 ft    Wheelchair 50 feet with 2 turns activity    Assist        Assist Level: Moderate Assistance - Patient 50 - 74%   Wheelchair 150 feet activity     Assist      Assist Level: Moderate Assistance - Patient 50 - 74%   Blood pressure 112/70, pulse 69, temperature 98.5 F (36.9 C), resp. rate 17, height 5\' 5"  (1.651 m), weight 76 kg, SpO2 100 %.    Medical Problem List and Plan: 1.  R hemiplegia and impaired function/expressive aphasia and R inattention secondary to L MCA stroke with hemorrhagic extension- with dysphagia- on D2 with thins  -patient may  shower             -ELOS/Goals: 8/27, goals Mod I to min A  -Continue CIR    -have spoken with daughter re: plan, prognosis, etc 2.  Antithrombotics: -DVT/anticoagulation:  started eliquis on 8/11 per discussion with neurology             -antiplatelet therapy:  d/c'ed ASA (started eliquis)    3. Pain Management: will add low dose gabapentin 100mg  bid for RUE neuropathic pain  -tylenol prn. Well controlled 4. Mood: LCSW to follow for evaluation and support.              -antipsychotic agents: N/a 5. Neuropsych: This patient is not fully capable of making decisions on her  own behalf. 6. Skin/Wound Care: Routine pressure relief measures. IVs may be removed. 7. Fluids/Electrolytes/Nutrition: Monitor I/O. Na is 138 8/16  -prealbumin only 16.3   8/9---> 15.9   8/16   -continue remeron at HS for appetite  - po intake inconsistent but improving, family bringing food in too  -BMET tomorrow, recheck prealbumin Filed Weights   05/22/20 0608 05/23/20 0619 05/24/20 0544  Weight: 75.8 kg 75.9 kg 76 kg    8. A fib with RVR: Monitor HR bid--continue metoprolol 75 mg bid.   -HR 70's-90's  - eliquis    Well controlled 9. HTN: Monitor BP tid.  On Lopressor 75 mg bid    Vitals:   05/23/20 1941 05/24/20 0544  BP: 104/64 112/70  Pulse: 75 69  Resp: 17 17  Temp: 98.4 F (36.9 C) 98.5 F (36.9 C)   SpO2: 100% 100%  8/22--well controlled 10 Dysphagia: On Dysphagia 2, thin liquids. Encourage intake.   -family can bring in food from home with in dietary restrictions.  11. ETOH abuse: Has not had issues with withdrawal.  12. Prediabetes: Hgb A1c-6.1.   -cbg's well controlled 13. ABLA: Hgb 11.1 8/16   LOS: 24 days A FACE TO FACE EVALUATION WAS PERFORMED  05/25/20 05/24/2020, 12:04 PM

## 2020-05-25 ENCOUNTER — Encounter (HOSPITAL_COMMUNITY): Payer: Self-pay

## 2020-05-25 ENCOUNTER — Ambulatory Visit (HOSPITAL_COMMUNITY): Payer: Self-pay

## 2020-05-25 ENCOUNTER — Inpatient Hospital Stay (HOSPITAL_COMMUNITY): Payer: Self-pay

## 2020-05-25 ENCOUNTER — Encounter (HOSPITAL_COMMUNITY): Payer: Self-pay | Admitting: Speech Pathology

## 2020-05-25 LAB — BASIC METABOLIC PANEL
Anion gap: 11 (ref 5–15)
BUN: 9 mg/dL (ref 8–23)
CO2: 24 mmol/L (ref 22–32)
Calcium: 9.3 mg/dL (ref 8.9–10.3)
Chloride: 104 mmol/L (ref 98–111)
Creatinine, Ser: 0.66 mg/dL (ref 0.44–1.00)
GFR calc Af Amer: >60 mL/min (ref >60)
GFR calc non Af Amer: >60 mL/min (ref >60)
Glucose, Bld: 108 mg/dL — ABNORMAL HIGH (ref 70–99)
Potassium: 4 mmol/L (ref 3.5–5.1)
Sodium: 139 mmol/L (ref 135–145)

## 2020-05-25 LAB — CBC
HCT: 32.9 % — ABNORMAL LOW (ref 36.0–46.0)
Hemoglobin: 10.7 g/dL — ABNORMAL LOW (ref 12.0–15.0)
MCH: 28.9 pg (ref 26.0–34.0)
MCHC: 32.5 g/dL (ref 30.0–36.0)
MCV: 88.9 fL (ref 80.0–100.0)
Platelets: 287 10*3/uL (ref 150–400)
RBC: 3.7 MIL/uL — ABNORMAL LOW (ref 3.87–5.11)
RDW: 13.5 % (ref 11.5–15.5)
WBC: 4 10*3/uL (ref 4.0–10.5)
nRBC: 0 % (ref 0.0–0.2)

## 2020-05-25 LAB — PREALBUMIN: Prealbumin: 15.8 mg/dL — ABNORMAL LOW (ref 18–38)

## 2020-05-25 MED ORDER — METOPROLOL TARTRATE 50 MG PO TABS
50.0000 mg | ORAL_TABLET | Freq: Two times a day (BID) | ORAL | Status: DC
  Administered 2020-05-25 – 2020-05-28 (×5): 50 mg via ORAL
  Filled 2020-05-25 (×8): qty 1

## 2020-05-25 NOTE — Progress Notes (Signed)
Physical Therapy Session Note  Patient Details  Name: Lindsey [name] MRN: [id number] Date of Birth: [birth year]  Today's Date: 05/25/2020 PT Individual Time: 1445-1530 PT Individual Time Calculation (min): 45 min   Short Term Goals: Week 4:  PT Short Term Goal 1 (Week 4): STG=LTG due to ELOS.  Skilled Therapeutic Interventions/Progress Updates:    Pt received supine in bed, sleeping on arrival but awakens easily to voice. Pt agreeable to PT session, denies pain throughout session. Shoes donned with totalA while supine for time management purposes. Pt performed supine<>sit with minA with HOB elevated, use of HR's and cues provided for sequencing, assist for trunk and RLE management. Slideboard transfer from EOB to w/c towards pt's L side with minA. Transported in w/c with totalA to hallway to perform gait training. Pt ambulated 2x21f with min/modA and L HR with +2 for w/c follow, able to progress to ambulated 444fwith modA and RW/R hand splint with w/c follow. Pt demo's step-to gait with decreased B step length, therapist providing facilitation for RLE swing and blocking posterior/anteriorly to prevent hyperextension and buckling, monitoring for R ankle inversion with stance. Pt also required assist for RW management to maintain straight path. Pt transported back to her room in w/c, slide board with minA and lateral scooting along EOB with minA (for RLE). Sit>supine with minA for RLE and posterior scooting with HOB flat with minA. Bed rails up, needs in reach, bed alarm on, all needs met.  Therapy Documentation Precautions:  Precautions Precautions: Fall Precaution Comments: R neglect, R hemi Restrictions Weight Bearing Restrictions: No Vital Signs: Therapy Vitals Pulse Rate: 82 Resp: 17 BP: 111/73 Patient Position (if appropriate): Lying Oxygen Therapy SpO2: 98 % O2 Device: Room Air Pain: Pain Assessment Pain Scale: 0-10 Pain Score: 0-No pain  Therapy/Group: Individual  Therapy  ChAlger SimonsT, DPT 05/25/2020, 3:33 PM

## 2020-05-25 NOTE — Progress Notes (Addendum)
[facility] PHYSICAL MEDICINE & REHABILITATION PROGRESS NOTE   Subjective/Complaints: No new complaints Labs stable Tachy to 104 this morning- irregular rhythm. Cardiology consult reviewed. No anticoagulation recommended.  ROS:  limited by language   Objective:   No results found. Recent Labs    05/25/20 0508  WBC 4.0  HGB 10.7*  HCT 32.9*  PLT 287   Recent Labs    05/25/20 0508  NA 139  K 4.0  CL 104  CO2 24  GLUCOSE 108*  BUN 9  CREATININE 0.66  CALCIUM 9.3    Intake/Output Summary (Last 24 hours) at 05/25/2020 1152 Last data filed at 05/25/2020 0748 Gross per 24 hour  Intake 684 ml  Output --  Net 684 ml     Physical Exam: Vital Signs Blood pressure 116/80, pulse (!) 104, temperature 99.4 F (37.4 C), temperature source Oral, resp. rate 17, height 5\' 5"  (1.651 m), weight 74.1 kg, SpO2 99 %. General: Alert and oriented x 3, No apparent distress HEENT: Head is normocephalic, atraumatic, PERRLA, EOMI, sclera anicteric, oral mucosa pink and moist, dentition intact, ext ear canals clear,  Neck: Supple without JVD or lymphadenopathy Heart: Irregularly irregular rhythm. No murmurs rubs or gallops Chest: CTA bilaterally without wheezes, rales, or rhonchi; no distress Abdomen: Soft, non-tender, non-distended, bowel sounds positive. Extremities: No clubbing, cyanosis, or edema. Pulses are 2+ Psych: pleasant and cooperative Skin: Clean and intact without signs of breakdown Musculoskeletal: Full range of motion in all 4 extremities. No joint swelling seen. Right forearms still a little sensitive.  Neuro: expressive aphasia, apraxia. Right sided hemiplegia 0 to tr/5 RUE  and 1+ to 2-/5 HE, KE,  0/5 distally RLE. Left side 5/5-- sl flexor tone RUE ongoing  Assessment/Plan: 1. 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  Physiatrist  is providing close team supervision and 24 hour management of active medical problems listed below.  Physiatrist and rehab team continue to assess barriers to discharge/monitor patient progress toward functional and medical goals  Care Tool:  Bathing    Body parts bathed by patient: Chest, Abdomen, Front perineal area, Left upper leg, Face, Right upper leg, Right lower leg, Right arm, Left lower leg   Body parts bathed by helper: Left arm, Buttocks     Bathing assist Assist Level: Moderate Assistance - Patient 50 - 74%     Upper Body Dressing/Undressing Upper body dressing   What is the patient wearing?: Pull over shirt    Upper body assist Assist Level: Moderate Assistance - Patient 50 - 74%    Lower Body Dressing/Undressing Lower body dressing      What is the patient wearing?: Incontinence brief, Pants     Lower body assist Assist for lower body dressing: Moderate Assistance - Patient 50 - 74%     Toileting Toileting    Toileting assist Assist for toileting: Total Assistance - Patient < 25%     Transfers Chair/bed transfer  Transfers assist  Chair/bed transfer activity did not occur: Safety/medical concerns  Chair/bed transfer assist level: Minimal Assistance - Patient > 75% (slide board) Chair/bed transfer assistive device: Armrests   Locomotion Ambulation   Ambulation assist   Ambulation activity did not occur: Safety/medical concerns  Assist level: Minimal Assistance - Patient > 75% Assistive device: Other (comment) (L rail) Max distance: 30 ft   Walk 10 feet activity   Assist  Walk  10 feet activity did not occur: Safety/medical concerns  Assist level: Minimal Assistance - Patient > 75% Assistive device: Other (comment) (L hallway rail)   Walk 50 feet activity   Assist Walk 50 feet with 2 turns activity did not occur: Safety/medical concerns         Walk 150 feet activity   Assist Walk 150 feet activity did not occur:  Safety/medical concerns         Walk 10 feet on uneven surface  activity   Assist Walk 10 feet on uneven surfaces activity did not occur: Safety/medical concerns         Wheelchair     Assist Will patient use wheelchair at discharge?: Yes Type of Wheelchair: Manual    Wheelchair assist level: Moderate Assistance - Patient 50 - 74% Max wheelchair distance: 150 ft    Wheelchair 50 feet with 2 turns activity    Assist        Assist Level: Moderate Assistance - Patient 50 - 74%   Wheelchair 150 feet activity     Assist      Assist Level: Moderate Assistance - Patient 50 - 74%   Blood pressure 116/80, pulse (!) 104, temperature 99.4 F (37.4 C), temperature source Oral, resp. rate 17, height 5\' 5"  (1.651 m), weight 74.1 kg, SpO2 99 %.    Medical Problem List and Plan: 1.  R hemiplegia and impaired function/expressive aphasia and R inattention secondary to L MCA stroke with hemorrhagic extension- with dysphagia- on D2 with thins  -patient may  shower             -ELOS/Goals: 8/27, goals Mod I to min A  -Continue CIR    -have spoken with daughter re: plan, prognosis, etc 2.  Antithrombotics: -DVT/anticoagulation:  started eliquis on 8/11 per discussion with neurology             -antiplatelet therapy:  d/c'ed ASA (started eliquis)    3. Pain Management: will add low dose gabapentin 100mg  bid for RUE neuropathic pain  -tylenol prn. Well controlled.  4. Mood: LCSW to follow for evaluation and support.              -antipsychotic agents: N/a 5. Neuropsych: This patient is not fully capable of making decisions on her  own behalf. 6. Skin/Wound Care: Routine pressure relief measures. IVs may be removed. 7. Fluids/Electrolytes/Nutrition: Monitor I/O. Na is 138 8/16  -prealbumin only 16.3   8/9---> 15.9   8/16   -continue remeron at HS for appetite  - po intake inconsistent but improving, family bringing food in too  8/23: prealbumin continues to lower, now  15.8. Will obtain dietary consult.  Filed Weights   05/23/20 0619 05/24/20 0544 05/25/20 0454  Weight: 75.9 kg 76 kg 74.1 kg    8. A fib with RVR: Monitor HR bid--continue metoprolol 75 mg bid.   -HR 70's-90's  - eliquis    8/23: tachycardic to 104. ECG obtained given afib. Shows 83bpm afib. Card consult reviewed- not AC candidate. Meds reviewed- lopressor was held last night for bradycardia, likely resulting in spike in HR this morning. Rate had been very well controlled prior to this (occasionally bradycardic). Will decrease Lopressor to 50mg  BID to avoid dose being held again. Continue to monitor TID. Patient is asymptomatic.  9. HTN: Monitor BP tid.  On Lopressor 75 mg bid, changed to 50mg  BID 8/23.     Vitals:   05/24/20 2018 05/25/20 0454  BP: 108/66 116/80  Pulse: 67 (!) 104  Resp:  17  Temp:  99.4 F (37.4 C)  SpO2:  99%  8/22--well controlled 10 Dysphagia: On Dysphagia 2, thin liquids. Encourage intake.   -family can bring in food from home with in dietary restrictions.  11. ETOH abuse: Has not had issues with withdrawal.  12. Prediabetes: Hgb A1c-6.1.   -cbg's well controlled 13. ABLA: Hgb 11.1 8/16, down to 10.7 on 8/23.    LOS: 25 days A FACE TO FACE EVALUATION WAS PERFORMED  Drema Pry Paula Busenbark 05/25/2020, 11:52 AM

## 2020-05-25 NOTE — Progress Notes (Signed)
Physical Therapy Session Note  Patient Details  Name: Lindsey [name] MRN: [id number] Date of Birth: 1956-09-04  Today's Date: 05/25/2020 PT Individual Time: 1050-1135 PT Individual Time Calculation (min): 45 min   Short Term Goals: Week 4:  PT Short Term Goal 1 (Week 4): STG=LTG due to ELOS.  Skilled Therapeutic Interventions/Progress Updates:     Patient in w/c with her daughter in the room upon PT arrival. Patient alert and agreeable to PT session. Patient denied pain during session. Patient's daughter participated in family education throughout session. Focused session on personal car transfer and determining equipment needs. Patient's daughter reports that there is room for a hospital bed downstairs. Educated patient and her daughter about benefits of having a hospital bed for improved positioning, transfers, and bed mobility for reduced caregiver burden and increased independence at home. Patient initially reluctant about having a hospital bed, but following education both patient and her daughter were in agreement with PT recommendation.   Therapeutic Activity: Transfers: Patient performed a personal sedan height car transfer x2 with min A-CGA using a slide board, with total A for board placement. Provided cues for safe technique, w/c set-up, board placement, seat adjustment to promote downslope in the direction the patient is sliding, and promoting increased hip clearance rather than sliding for improved skin integrity. Patient's daughter performed second transfers with min cues for set-up and assist technique. Demonstrated good safety awareness and body mechanics throughout.   Wheelchair Mobility:  Patient was transported in the w/c with total A to/from the [employer] main entrance session for energy conservation and time management. Educated patient's daughter on leg rest managment, use of breaks, removal of arm rest for transfers, technique for folding w/c to place in the car. Patient's  daughter demonstrated each skills using teach back method.   Discussed further family education with patient's daughter and grandson. Patient's daughter would like to have one more session prior to d/c. Scheduled for Thursday from 9-12, scheduling made aware.   Patient in w/c handed off to NT to transfer back to bed at end of session.  Therapy Documentation Precautions:  Precautions Precautions: Fall Precaution Comments: R neglect, R hemi Restrictions Weight Bearing Restrictions: No   Therapy/Group: Individual Therapy  Annjeanette Sarwar L [email or web address] [email or web address]  05/25/2020, 4:32 PM

## 2020-05-25 NOTE — Progress Notes (Signed)
Occupational Therapy Session Note  Patient Details  Name: Lindsey [name] MRN: [id number] Date of Birth: 1956-04-23  Today's Date: 05/25/2020 OT Individual Time: 4132-4401 OT Individual Time Calculation (min): 70 min    Short Term Goals: Week 4:  OT Short Term Goal 1 (Week 4): STG=LTG d/t ELOS  Skilled Therapeutic Interventions/Progress Updates:    Pt received supine with her daughter present for family education. Education focused on ADLs, navigating around physical barriers in the home, potential equipment to use during ADLs, and overall transfer/bathing/dressing techniques. Pt completed bed mobility with (S), using bed rail and min cueing. Pt completed slideboard transfer to the drop arm BSC with min A, assist for placement of board. Pt voided BM and urine, however was also incontinent of urine in brief. Pt stood with min A and required min stabilization in standing to complete peri hygiene. Pt's daughter returned demonstration and was provided additional cueing for body mechanics and safety when providing standing assist. Pt completed lateral scoot to her w/c with min A. She transferred onto TTB in shower, with education provided re showering, were family to install a chair lift to access upstairs shower. Demo-ed [facility] assistance for washing the LUE with her RUE. Overall min A for bathing. Pt transferred back to w/c following shower with min A. She completed oral care with set up assist. Pt donned bra and shirt with mod A, requiring cueing for hemi technique. Pt completed LB dressing with min A. Shoes and socks donned max A for time management. Gave several suggestions for RUE NMR during ADLs and provided edu on inattention and its' effect on voluntary muscle activation. Pt was left sitting up with all needs met, daughter present.   Therapy Documentation Precautions:  Precautions Precautions: Fall Precaution Comments: R neglect, R hemi Restrictions Weight Bearing Restrictions:  No  Therapy/Group: Individual Therapy  Curtis Sites 05/25/2020, 9:49 AM

## 2020-05-25 NOTE — Progress Notes (Signed)
Speech Language Pathology Daily Session Note  Patient Details  Name: Lindsey [name] MRN: [id number] Date of Birth: 08-14-1956  Today's Date: 05/25/2020 SLP Individual Time: 1000-1040 SLP Individual Time Calculation (min): 40 min  Short Term Goals: Week 4: SLP Short Term Goal 1 (Week 4): STGs=LTGs due to ELOS  Skilled Therapeutic Interventions: Pt was seen for skilled ST targeting dysphagia and speech goals. Pt's family had brought her partial denture plate, which was donned at beginning of session. SLP then facilitated session with trial of advanced Dysphagia 3 (mechanical soft) solids and thin liquids. Pt with no overt s/sx aspiration across textures, and only Min right buccal residue of soft solids noted. Pt cleared oral cavity of all residue with Supervision A verbal cues for liquid washes and digital sweep. Recommend continue current diet, however pt is making excellent progress toward solid advancement. SLP further facilitated session with phrase completion tasks. Provided Max A picture supports, written cues, as well as verbal and articulatory placement models (with clear mask) and intermittent semantic cues, pt completed phrases that described actions (ex: sweeping, driving, etc.) with overall 65% accuracy. Pt returned to her room and left sitting in wheelchair with family present and needs within reach. All questions answered to their satisfaction. Continue per current plan of care.          Pain    Therapy/Group: Individual Therapy  Little Ishikawa 05/25/2020, 12:23 PM

## 2020-05-25 NOTE — Progress Notes (Signed)
Patient ID: Lindsey [name], female   DOB: [birth year], 64 y.o.   MRN: [id number]  SW spoke with pt dtr Lindsey [name] to discuss DME recommendations: hospital bed, slide board, w/c with R lap tray and DABSC. Dtr is ok with SW to order DME through [employer]. SW informed there will be updates from team conference, and if able to get charity for [facility] services.   SW sent DME order to [employer] via parachute.   Cecile Sheerer, MSW, LCSWA Office: [phone] Cell: [phone] Fax: [phone]

## 2020-05-26 ENCOUNTER — Inpatient Hospital Stay (HOSPITAL_COMMUNITY): Payer: Self-pay | Admitting: Occupational Therapy

## 2020-05-26 ENCOUNTER — Inpatient Hospital Stay (HOSPITAL_COMMUNITY): Payer: Self-pay

## 2020-05-26 ENCOUNTER — Inpatient Hospital Stay (HOSPITAL_COMMUNITY): Payer: Self-pay | Admitting: Speech Pathology

## 2020-05-26 ENCOUNTER — Inpatient Hospital Stay (HOSPITAL_COMMUNITY): Payer: Self-pay | Admitting: Physical Therapy

## 2020-05-26 MED ORDER — PROSOURCE PLUS PO LIQD
30.0000 mL | Freq: Two times a day (BID) | ORAL | Status: DC
  Administered 2020-05-26 – 2020-05-27 (×3): 30 mL via ORAL
  Filled 2020-05-26 (×4): qty 30

## 2020-05-26 MED ORDER — FE FUMARATE-B12-VIT C-FA-IFC PO CAPS
1.0000 | ORAL_CAPSULE | Freq: Two times a day (BID) | ORAL | Status: DC
  Administered 2020-05-26 – 2020-05-29 (×6): 1 via ORAL
  Filled 2020-05-26 (×6): qty 1

## 2020-05-26 NOTE — Progress Notes (Signed)
Occupational Therapy Session Note  Patient Details  Name: Lindsey [name] MRN: [id number] Date of Birth: 06-26-1956  Today's Date: 05/26/2020 OT Individual Time: 2025-4270 OT Individual Time Calculation (min): 40 min    Short Term Goals: Week 4:  OT Short Term Goal 1 (Week 4): STG=LTG d/t ELOS  Skilled Therapeutic Interventions/Progress Updates:    Pt sitting up in w/c, agreeable to OT session.  Pt session focused on dressing and bathing at sinkside this day with education regarding hemi compensatory strategy implementation.  Pt required min assist to doff shirt, pants, brief, socks and shoes.  Pt bathed with mod assist for back and LUE using hand over hand technique for neuro feedback and re-ed.  Pt bathed LB in sitting and standing with min assist for buttocks and to block right knee when standing.  Pt educated on hemi technique to donn bra, shirt, pants, brief, socks and shoes.  Pt required min assist to donn shirt and bra and mod assist to donn pants and brief.  Min assist to donn socks and shoes.  Pt returned to bedside, call bell in reach, seat belt alarm on.  Therapy Documentation Precautions:  Precautions Precautions: Fall Precaution Comments: R neglect, R hemi Restrictions Weight Bearing Restrictions: No   Therapy/Group: Individual Therapy  Amie Critchley 05/26/2020, 12:16 PM

## 2020-05-26 NOTE — Progress Notes (Signed)
Physical Therapy Note  Patient Details  Name: Charnelle Bergeman MRN: [id number] Date of Birth: 1956-08-01 Today's Date: 05/26/2020   Recommending the following equipment to increase functional independence with mobility:  18"x18" Light weight manual wheelchair with R lap tray - for increased independence with locomotion at home and improved sitting tolerance OOB  Hospital bed - for improved pressure relief, ability to sit up-right for eating bed level, increased independence with bed mobility with use of bed rails, and decreased caregiver burden with patient care  30" Slide board - for increased independence with basic transfers and reduced caregiver burden       Cosette Prindle L Markel Mergenthaler PT, DPT  05/26/2020, 6:29 AM

## 2020-05-26 NOTE — Progress Notes (Signed)
Initial Nutrition Assessment  DOCUMENTATION CODES:   Not applicable  INTERVENTION:   - Magic cup TID with meals, each supplement provides 290 kcal and 9 grams of protein  - ProSource Plus 30 ml po BID, each supplement provides 100 kcal and 15 grams of protein  - Continue MVI daily  - d/c Ensure Enlive due to pt refusal  NUTRITION DIAGNOSIS:   Increased nutrient needs related to acute illness as evidenced by estimated needs.  GOAL:   Patient will meet greater than or equal to 90% of their needs  MONITOR:   PO intake, Supplement acceptance, Diet advancement, Labs, Weight trends  REASON FOR ASSESSMENT:   Consult Assessment of nutrition requirement/status  ASSESSMENT:   64 year old female with PMH of HTN, uterine prolapse, EtOH abuse who was admitted on 7/20 with right-sided weakness and slurred speech. CT head showed large MCA infarct and CTA brain revealed a left M2 occlusion. Follow-up MRI brain later that day showed acute left MCA infarct with mild hemorrhage in caudate, putamen and left frontal lobe. Follow-up CT head showed evolving infarct with increase in cytotoxic edema and partial effacement of left lateral ventricle with stable areas of hemorrhage.  Noted target d/c date of 8/27.  Attempted to engage with pt at bedside. Pt awoke briefly to RD voice then went back to sleep. Unable to obtain diet and weight history at this time.  RD consulted due to low prealbumin. Noted pt on remeron for appetite stimulation.  Per [facility] documentation, pt refusing >90% of Ensure Enlive oral nutrition supplements. RD will d/c these and trial Magic Cups with meals.  Weight fairly stable since admit to CIR with fluctuations between 158-169 lbs.  Meal Completion: 50-100%  Medications reviewed and include: Ensure Enlive TID, trinsicon/foltrin, remeron  Labs reviewed.  NUTRITION - FOCUSED PHYSICAL EXAM:  Unable to complete at this time. Pt declined.  Diet Order:   Diet Order             DIET DYS 2 Room service appropriate? Yes with Assist; Fluid consistency: Thin  Diet effective now                 EDUCATION NEEDS:   Not appropriate for education at this time  Skin:  Skin Assessment: Reviewed RN Assessment  Last BM:  05/24/20  Height:   Ht Readings from Last 1 Encounters:  04/30/20 5\' 5"  (1.651 m)    Weight:   Wt Readings from Last 1 Encounters:  05/26/20 73.1 kg    Ideal Body Weight:  56.8 kg  BMI:  Body mass index is 26.82 kg/m.  Estimated Nutritional Needs:   Kcal:  1800-2000  Protein:  85-100 grams  Fluid:  1.8-2.0 L    05/28/20, MS, RD, LDN Inpatient Clinical Dietitian Please see AMiON for contact information.

## 2020-05-26 NOTE — Patient Care Conference (Signed)
Inpatient RehabilitationTeam Conference and Plan of Care Update Date: 05/26/2020   Time: 10:06 AM    Patient Name: Lindsey [name]      Medical Record Number: [id number]  Date of Birth: 10/24/1955 Sex: Female         Room/Bed: 4W23C/4W23C-01 Payor Info: Payor: /    Admit Date/Time:  04/30/2020  3:45 PM  Primary Diagnosis:  Right hemiplegia [facility])  Hospital Problems: Principal Problem:   Right hemiplegia (HCC) Active Problems:   Acute ischemic left MCA stroke [facility])    Expected Discharge Date: Expected Discharge Date: 05/29/20  Team Members Present: Physician leading conference: Dr. Faith Rogue Care Coodinator Present: Cecile Sheerer, LCSWA;Eren Ryser Marlyne Beards, RN, BSN, CRRN Nurse Present: Margot Ables, LPN PT Present: Serina Cowper, PT OT Present: Jake Shark, OT SLP Present: Feliberto Gottron, SLP PPS Coordinator present : Edson Snowball, PT     Current Status/Progress Goal Weekly Team Focus  Bowel/Bladder   continent of b/b; LBM: 08/22  remain continent of b/b  assist with toileting needs prn   Swallow/Nutrition/ Hydration   Dys. 2 textures with thin liquids, Supervisin-Min A  Supervision  trials of Dys. 3 textures   ADL's   Min-mod A for UB/LB ADLs, min A sit <> stands, requiring R knee blocking and ankle support. Min-mod A squat pivots, min-CGA slideboard. Mod A toileting.  min A - (S)  ADL retraining, ADL transfers, d/c planning, family education   Mobility   Min A-CGA bed mobility, transfers, and w/c mobility, mod A gait 40 ft  Supervision bed mobility, transfers, and w/c mobility, mod A gait 50 ft  Functional mobility, balance, activity tolerance, gait training, w/c mobility, R UE/LE NMR, d/c planning, patient/caregiver education   Communication   Mod-Max A  Mod-Max A  Family Education   Safety/Cognition/ Behavioral Observations  Min A  Min A  Family education   Pain   no c/o pain  remain pain free  assess pain level QS and prn   Skin   skin intact   maintain skin integrity  assess skin QS and prn     Discharge Planning:  Patient to discharge home with daughter who will provide care. Pt to also have assistance from grandson and sister who will provide care as well. Fam Edu scheduled for 8/20 with dtr and grandson Lindsey [name]; and 8/23 dtr and pt sister Lindsey [name].   Team Discussion: Continent B/B, Min/mod assist with ADL's. Ambulating with rolling walker and mod assist. Car transfers with daughter, min assist/squat pivot. Going home on Dys 3/thin and family education focused on how to cue patient. Patient goals are min assist to supervision. Patient on target to meet rehab goals: yes  *See Care Plan and progress notes for long and short-term goals.   Revisions to Treatment Plan:  None  Teaching Needs: Continue family education until discharge  Current Barriers to Discharge: Lack of insurance.  Possible Resolutions to Barriers: On the charity list.     Medical Summary Current Status: gradually improving language, modest motor changes. bp, hr generally controlled, po intake picking up  Barriers to Discharge: Medical stability   Possible Resolutions to [employer] Focus: encourage po, dietary supps, daily medication review/adjustment   Continued Need for Acute Rehabilitation Level of Care: The patient requires daily medical management by a physician with specialized training in physical medicine and rehabilitation for the following reasons: Direction of a multidisciplinary physical rehabilitation program to maximize functional independence : Yes Medical management of patient stability for increased activity during participation in  an intensive rehabilitation regime.: Yes Analysis of laboratory values and/or radiology reports with any subsequent need for medication adjustment and/or medical intervention. : Yes   I attest that I was present, lead the team conference, and concur with the assessment and plan of the  team.   Kennyth Arnold G 05/26/2020, 1:25 PM

## 2020-05-26 NOTE — Progress Notes (Signed)
Speech Language Pathology Daily Session Note  Patient Details  Name: Lindsey [name] MRN: [id number] Date of Birth: [birth year]  Today's Date: 05/26/2020 SLP Individual Time: 0900-0955 SLP Individual Time Calculation (min): 55 min  Short Term Goals: Week 4: SLP Short Term Goal 1 (Week 4): STGs=LTGs due to ELOS  Skilled Therapeutic Interventions: Skilled treatment session focused on communication goals. SLP facilitated session by providing overall Max A visual, verbal, semantic and articulatory cues for verbal expression at the word level during a phrase completion task with 75% accuracy. Patient also copied at the word level during written expression with Min verbal cues to self-monitor perseveration and Mod A multimodal cues to self-correct. Patient left upright in the wheelchair with alarm on and all needs within reach. Continue with current plan of care.      Pain Pain Assessment Pain Scale: 0-10 Pain Score: 0-No pain  Therapy/Group: Individual Therapy  Chanley Mcenery 05/26/2020, 10:13 AM

## 2020-05-26 NOTE — Progress Notes (Signed)
Physical Therapy Session Note  Patient Details  Name: Armiyah Capron MRN: [id number] Date of Birth: [birth year]  Today's Date: 05/26/2020 PT Missed Time: 45 Minutes Missed Time Reason: Patient fatigue;Patient unwilling to participate    Pt received asleep, supine in bed and startles awake to tactile stimulus. Pt shakes head "no" when asked to participate in therapy session and despite max encouragement/motivation pt continues to shake head "no" keeping eyes closed. Pt denies any needs at this time. Pt left supine in bed with call bell in reach and bed alarm on. Missed 45 minutes of skilled physical therapy.   Ginny Forth , PT, DPT, CSRS  05/26/2020, 3:33 PM

## 2020-05-26 NOTE — Progress Notes (Addendum)
Physical Therapy Session Note  Patient Details  Name: Lindsey [name] MRN: [id number] Date of Birth: 1956/01/14  Today's Date: 05/26/2020 PT Individual Time: 0800-0830 PT Individual Time Calculation (min): 30 min   Short Term Goals: Week 4:  PT Short Term Goal 1 (Week 4): STG=LTG due to ELOS.  Skilled Therapeutic Interventions/Progress Updates:    Pt received supine in bed, agreeable to PT session. Pt denies pain. Performed lower body dressing with maxA (for time management) while supine in bed via threading RLE, donned shoes with totalA while supine. Performed supine>sit with minA for trunk elevation, able to maintain sitting EOB with supervision. Performed slide board transfer towards L with minA (totalA for board placement). Transported to hallway with totalA in w/c for time management. Pt ambulated ~57ft with modA and RW with RW hand splint and w/c follow, used R foot cover to assist with RLE clearance. Pt continues to demonstrate difficulty with motor planning during gait and will frequently have premature step with LLE, not allowing RLE to reach near terminal extension. Required facilitation for RLE swing and prevention of hyperextension/buckling, pt with improved ability to maintain straight path with RW. Pt reporting moderate fatigue after completion, denies any pain. Pt transported back to her room and remained seated in her w/c with belt alarm on, pillow supporting RUE, needs in reach.   Therapy Documentation Precautions:  Precautions Precautions: Fall Precaution Comments: R neglect, R hemi Restrictions Weight Bearing Restrictions: No Vital Signs: Therapy Vitals Temp: 98.6 F (37 C) Temp Source: Oral Pulse Rate: 68 Resp: 16 BP: 109/66 Patient Position (if appropriate): Lying Oxygen Therapy SpO2: 100 % O2 Device: Room Air Pain: Pain Assessment Pain Scale: 0-10 Pain Score: 0-No pain  Therapy/Group: Individual Therapy  Leiya Keesey P Iain Sawchuk PT 05/26/2020, 9:18 AM

## 2020-05-26 NOTE — Progress Notes (Signed)
Physical Therapy Session Note  Patient Details  Name: Lindsey [name] MRN: [id number] Date of Birth: 01/04/1956  Today's Date: 05/26/2020  Patient in bed asleep upon PT arrival. Patient easily aroused to verbal stimulation. Patient reported having a mild headache and requesting to sleep, reporting that she had not rested since waking-up this morning. Patient missed 30 min of skilled PT due to fatigue, RN made aware. Will attempt to make-up missed time as able.     Therapy Documentation Precautions:  Precautions Precautions: Fall Precaution Comments: R neglect, R hemi Restrictions Weight Bearing Restrictions: No    Therapy/Group: Individual Therapy  [name] PT, DPT  05/26/2020, 6:34 AM

## 2020-05-26 NOTE — Progress Notes (Signed)
[place] PHYSICAL MEDICINE & REHABILITATION PROGRESS NOTE   Subjective/Complaints: Up in chair. Comfortable. Watching TV  ROS: limited due to language/communication    Objective:   No results found. Recent Labs    05/25/20 0508  WBC 4.0  HGB 10.7*  HCT 32.9*  PLT 287   Recent Labs    05/25/20 0508  NA 139  K 4.0  CL 104  CO2 24  GLUCOSE 108*  BUN 9  CREATININE 0.66  CALCIUM 9.3    Intake/Output Summary (Last 24 hours) at 05/26/2020 1310 Last data filed at 05/26/2020 0759 Gross per 24 hour  Intake 444 ml  Output --  Net 444 ml     Physical Exam: Vital Signs Blood pressure 109/66, pulse 68, temperature 98.6 F (37 C), temperature source Oral, resp. rate 16, height 5\' 5"  (1.651 m), weight 73.1 kg, SpO2 100 %. Constitutional: No distress . Vital signs reviewed. HEENT: EOMI, oral membranes moist Neck: supple Cardiovascular: IRR IRR without murmur. No JVD    Respiratory/Chest: CTA Bilaterally without wheezes or rales. Normal effort    GI/Abdomen: BS +, non-tender, non-distended Ext: no clubbing, cyanosis, or edema Psych: pleasant and cooperative Skin: Clean and intact without signs of breakdown Musculoskeletal: Full range of motion in all 4 extremities. No joint swelling seen. Right forearms still a little sensitive.  Neuro: expressive aphasia, apraxia. Speaking more frequently intelligible yes/no answers.  Right sided hemiplegia tr/5 RUE  and 1+ to 2-/5 HE, KE,  0/5 distally RLE. Left side 5/5-- subtle flexor tone RUE ongoing  Assessment/Plan: 1. 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  Physiatrist is providing close team supervision and 24 hour management of active medical problems listed below.  Physiatrist and rehab team continue to assess barriers to discharge/monitor patient progress toward functional and medical  goals  Care Tool:  Bathing    Body parts bathed by patient: Chest, Abdomen, Front perineal area, Left upper leg, Face, Right upper leg, Right lower leg, Right arm, Left lower leg   Body parts bathed by helper: Left arm, Buttocks     Bathing assist Assist Level: Minimal Assistance - Patient > 75%     Upper Body Dressing/Undressing Upper body dressing   What is the patient wearing?: Pull over shirt    Upper body assist Assist Level: Minimal Assistance - Patient > 75%    Lower Body Dressing/Undressing Lower body dressing      What is the patient wearing?: Incontinence brief, Pants     Lower body assist Assist for lower body dressing: Moderate Assistance - Patient 50 - 74%     Toileting Toileting    Toileting assist Assist for toileting: Total Assistance - Patient < 25%     Transfers Chair/bed transfer  Transfers assist  Chair/bed transfer activity did not occur: Safety/medical concerns  Chair/bed transfer assist level: Minimal Assistance - Patient > 75% (slide board) Chair/bed transfer assistive device: Armrests   Locomotion Ambulation   Ambulation assist   Ambulation activity did not occur: Safety/medical concerns  Assist level: Minimal Assistance - Patient > 75% Assistive device: Other (comment) (L rail) Max distance: 30 ft   Walk 10 feet activity   Assist  Walk 10 feet activity did not occur: Safety/medical concerns  Assist level: Minimal Assistance - Patient > 75% Assistive device: Other (comment) (L hallway rail)   Walk 50 feet activity   Assist  Walk 50 feet with 2 turns activity did not occur: Safety/medical concerns         Walk 150 feet activity   Assist Walk 150 feet activity did not occur: Safety/medical concerns         Walk 10 feet on uneven surface  activity   Assist Walk 10 feet on uneven surfaces activity did not occur: Safety/medical concerns         Wheelchair     Assist Will patient use wheelchair at  discharge?: Yes Type of Wheelchair: Manual    Wheelchair assist level: Moderate Assistance - Patient 50 - 74% Max wheelchair distance: 150 ft    Wheelchair 50 feet with 2 turns activity    Assist        Assist Level: Moderate Assistance - Patient 50 - 74%   Wheelchair 150 feet activity     Assist      Assist Level: Moderate Assistance - Patient 50 - 74%   Blood pressure 109/66, pulse 68, temperature 98.6 F (37 C), temperature source Oral, resp. rate 16, height 5\' 5"  (1.651 m), weight 73.1 kg, SpO2 100 %.    Medical Problem List and Plan: 1.  R hemiplegia and impaired function/expressive aphasia and R inattention secondary to L MCA stroke with hemorrhagic extension- with dysphagia- on D2 with thins  -patient may  shower             -ELOS/Goals: 8/27, goals Mod I to min A  -Continue CIR     2.  Antithrombotics: -DVT/anticoagulation:  started eliquis on 8/11 per discussion with neurology             -antiplatelet therapy:  d/c'ed ASA (started eliquis)    3. Pain Management: will add low dose gabapentin 100mg  bid for RUE neuropathic pain  -tylenol prn. Well controlled.  4. Mood: LCSW to follow for evaluation and support.              -antipsychotic agents: N/a 5. Neuropsych: This patient is not fully capable of making decisions on her  own behalf. 6. Skin/Wound Care: Routine pressure relief measures. IVs may be removed. 7. Fluids/Electrolytes/Nutrition: Monitor I/O. Na is 138 8/16  -prealbumin hovering around 16   -continue remeron at HS for appetite  - po intake inconsistent but improving, family bringing food in too  8/24 dietary consult requested yesterday re: low prealbumin, inadequate intake. Patient will need cueing and encouragement to maintain adequate intake. I have spoken to her daughter in this regard already. Filed Weights   05/24/20 0544 05/25/20 0454 05/26/20 0525  Weight: 76 kg 74.1 kg 73.1 kg    8. A fib with RVR: Monitor HR bid--continue  metoprolol 75 mg bid.   -HR 70's-90's  - eliquis    8/23: tachycardic to 104. May have been due to holding of lopressor. Lopressor reduced to 50mg  bid  8/24 bp/hr well controlled today. HR also needs to be recorded apically to record a accurate rate 9. HTN: Monitor BP tid.  On Lopressor changed to 50mg  BID 8/23.     Vitals:   05/25/20 2055 05/26/20 0525  BP: 110/68 109/66  Pulse: 78 68  Resp:  16  Temp:  98.6 F (37 C)  SpO2:  100%  8/24--well controlled 10 Dysphagia: On Dysphagia 2, thin liquids. Encourage intake.   -family can bring in food from home with in dietary restrictions.  11. ETOH abuse: Has not had issues with withdrawal.  12. Prediabetes: Hgb A1c-6.1.   -cbg's  well controlled 13. ABLA: Hgb 11.1 8/16, down to 10.7 on 8/23.    -add fe+ supp  -normocytic, likely d/t poor nutritional status LOS: 26 days A FACE TO FACE EVALUATION WAS PERFORMED  Lindsey [name] 05/26/2020, 1:10 PM

## 2020-05-27 ENCOUNTER — Inpatient Hospital Stay (HOSPITAL_COMMUNITY): Payer: Self-pay

## 2020-05-27 ENCOUNTER — Inpatient Hospital Stay (HOSPITAL_COMMUNITY): Payer: Self-pay | Admitting: Speech Pathology

## 2020-05-27 NOTE — Progress Notes (Signed)
Physical Therapy Session Note  Patient Details  Name: Lindsey [name] MRN: [id number] Date of Birth: July 08, 1956  Today's Date: 05/27/2020 PT Individual Time: [id number] and 1330-1415 PT Individual Time Calculation (min): 45 min and 45 min   Short Term Goals: Week 4:  PT Short Term Goal 1 (Week 4): STG=LTG due to ELOS.  Skilled Therapeutic Interventions/Progress Updates:     Session 1: Patient in bed upon PT arrival. Patient alert and agreeable to PT session. Patient denied pain during session.  Therapeutic Activity: Bed Mobility: Patient donned pants bed level with max A, focused on bridging technique x3 with facilitation for increased R hip clearance. She performed supine to/from sit with min A-CGA using the bed rail in a flat bed. Provided verbal cues for hemi technique pushing up from her R side. Donned B tennis shoes with total A for time management/energy conservation with patient sitting EOB with supervision for sitting balance. Transfers: Patient performed squat pivot bed>wc to the L with min A and cues for hand placement and facilitation for head-hips relationship. She performed sit to/from stand x2 with CGA-min A for forward weight shift using a RW with R hand splint, required [facility] assist for R hand placement into splint. Provided verbal cues for scooting forward to stand, forward weight shift, and increase hip/knee extension to stand.  Gait Training:  Patient ambulated 32 feet and 43 feet using RW with R hand splint and R DF wrap with eversion bias with mod progressing to min A for steadying support and min-mod A for R foot placement and blocking R knee extensor thrust in stance. Ambulated with step-to gait pattern leading with R, increased adduction and mild inversion with R limb advancement, decreased R toe clearance, and reduced R knee control and weight shift in stance. Provided verbal cues for sequencing, increased adduction with R step, erect posture, and increased R quad and  gluteal activation in stance.  Wheelchair Mobility:  Patient was transported in the w/c with total A throughout session for energy conservation and time management.  Patient in w/c in the room with R UE elevated on lap tray at end of session with breaks locked, seat belt alarm set, and all needs within reach.   Session 2: Patient in bed upon PT arrival. Patient alert and agreeable to PT session. Patient denied pain during session.  Therapeutic Activity: Bed Mobility: Patient performed supine to/from sit with CGA. Provided verbal cues for hemi-technique pushing up from the L this session. Required increased time and cues for R LE management without assist. Donned B tennis shoes with total A for time management/energy conservation with patient sitting EOB with supervision for sitting balance.  Transfers: Patient performed slide board transfers bed<>w/c and w/c<>mat table with CGA-min A and total A for board placement on R and max A on L. Provided cues for hand placement, board placement, and head-hips relationship. Patient performed sit to/from stand x1 in the // bars with CGA. Provided verbal cues for forward weight shift.  Wheelchair Mobility:  Patient propelled wheelchair >150 feet using L UE/LE hemi-technique with supervision and increased time to correct veering R intermittently. Provided verbal cues for use of L foot to steer, and avoiding objects on the R. Provided HOH assist for tight turning technique x1.   Neuromuscular Re-ed: Patient performed the following UE/LE motor control activities: -standing to/from quadruped on mat table with blue bench for UE support on elbows with mod-max A +2, third person SBA for safety on descent from mat table -tall kneeling  with B UE support on blue bench, provided facilitation for R UE weight bearing throughout:  -static balance with B UE support focused on hip extension   -static balance with R UE support only, focused on hip extension and R UE weight  bearing  -dynamic balance reaching with L UE across midline, slightly outside of BOS, for peg and placing peg on peg board x8, min-mod A for balance -standing balance in // bars with [facility] assist for R UE placement on bar and approximation to wrist and hand for weight bearing  -standing B UE support with close supervision for balance 10-15 sec x3  -standing R UE support with CGA-min A for balance 1 min, focused on use of R UE to regain balance throughout  Patient in bed at end of session with breaks locked, bed alarm set, and all needs within reach.    Therapy Documentation Precautions:  Precautions Precautions: Fall Precaution Comments: R neglect, R hemi Restrictions Weight Bearing Restrictions: No   Therapy/Group: Individual Therapy  Laurella Tull L Jaivon Vanbeek PT, DPT  05/27/2020, 5:49 PM

## 2020-05-27 NOTE — Progress Notes (Signed)
[facility] PHYSICAL MEDICINE & REHABILITATION PROGRESS NOTE   Subjective/Complaints: No complaints Discussed the beautiful pictures at the foot of her bed.   ROS:  Limited due to language/communication    Objective:   No results found. Recent Labs    05/25/20 0508  WBC 4.0  HGB 10.7*  HCT 32.9*  PLT 287   Recent Labs    05/25/20 0508  NA 139  K 4.0  CL 104  CO2 24  GLUCOSE 108*  BUN 9  CREATININE 0.66  CALCIUM 9.3    Intake/Output Summary (Last 24 hours) at 05/27/2020 1138 Last data filed at 05/27/2020 0807 Gross per 24 hour  Intake 804 ml  Output --  Net 804 ml     Physical Exam: Vital Signs Blood pressure 113/69, pulse 75, temperature 98 F (36.7 C), resp. rate 14, height 5\' 5"  (1.651 m), weight 73.1 kg, SpO2 100 %. General: Alert and oriented x 3, No apparent distress HEENT: Head is normocephalic, atraumatic, PERRLA, EOMI, sclera anicteric, oral mucosa pink and moist, dentition intact, ext ear canals clear,  Neck: Supple without JVD or lymphadenopathy Heart: Reg rate and rhythm. No murmurs rubs or gallops Chest: CTA bilaterally without wheezes, rales, or rhonchi; no distress Abdomen: Soft, non-tender, non-distended, bowel sounds positive. Extremities: No clubbing, cyanosis, or edema. Pulses are 2+ Skin: Clean and intact without signs of breakdown Musculoskeletal: Full range of motion in all 4 extremities. No joint swelling seen. Right forearms still a little sensitive.  Neuro: expressive aphasia, apraxia. Speaking more frequently intelligible yes/no answers.  Right sided hemiplegia tr/5 RUE  and 1+ to 2-/5 HE, KE,  0/5 distally RLE. Left side 5/5-- subtle flexor tone RUE ongoing   Assessment/Plan: 1. 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  Physiatrist is providing close team supervision and 24 hour management of  active medical problems listed below.  Physiatrist and rehab team continue to assess barriers to discharge/monitor patient progress toward functional and medical goals  Care Tool:  Bathing    Body parts bathed by patient: Chest, Abdomen, Front perineal area, Left upper leg, Face, Right upper leg, Right lower leg, Right arm, Left lower leg   Body parts bathed by helper: Left arm, Buttocks     Bathing assist Assist Level: Minimal Assistance - Patient > 75%     Upper Body Dressing/Undressing Upper body dressing   What is the patient wearing?: Pull over shirt    Upper body assist Assist Level: Minimal Assistance - Patient > 75%    Lower Body Dressing/Undressing Lower body dressing      What is the patient wearing?: Incontinence brief, Pants     Lower body assist Assist for lower body dressing: Moderate Assistance - Patient 50 - 74%     Toileting Toileting    Toileting assist Assist for toileting: Total Assistance - Patient < 25%     Transfers Chair/bed transfer  Transfers assist  Chair/bed transfer activity did not occur: Safety/medical concerns  Chair/bed transfer assist level: Minimal Assistance - Patient > 75% (slide board) Chair/bed transfer assistive device: Armrests   Locomotion Ambulation   Ambulation assist   Ambulation activity did not occur: Safety/medical concerns  Assist level: Minimal Assistance - Patient > 75% Assistive device: Other (comment) (L rail) Max distance: 30 ft   Walk 10 feet activity   Assist  Walk 10 feet activity did not occur:  Safety/medical concerns  Assist level: Minimal Assistance - Patient > 75% Assistive device: Other (comment) (L hallway rail)   Walk 50 feet activity   Assist Walk 50 feet with 2 turns activity did not occur: Safety/medical concerns         Walk 150 feet activity   Assist Walk 150 feet activity did not occur: Safety/medical concerns         Walk 10 feet on uneven surface   activity   Assist Walk 10 feet on uneven surfaces activity did not occur: Safety/medical concerns         Wheelchair     Assist Will patient use wheelchair at discharge?: Yes Type of Wheelchair: Manual    Wheelchair assist level: Moderate Assistance - Patient 50 - 74% Max wheelchair distance: 150 ft    Wheelchair 50 feet with 2 turns activity    Assist        Assist Level: Moderate Assistance - Patient 50 - 74%   Wheelchair 150 feet activity     Assist      Assist Level: Moderate Assistance - Patient 50 - 74%   Blood pressure 113/69, pulse 75, temperature 98 F (36.7 C), resp. rate 14, height 5\' 5"  (1.651 m), weight 73.1 kg, SpO2 100 %.    Medical Problem List and Plan: 1.  R hemiplegia and impaired function/expressive aphasia and R inattention secondary to L MCA stroke with hemorrhagic extension- with dysphagia- on D2 with thins  -patient may  shower             -ELOS/Goals: 8/27, goals Mod I to min A  -Continue CIR  2.  Antithrombotics: -DVT/anticoagulation:  started eliquis on 8/11 per discussion with neurology             -antiplatelet therapy:  d/c'ed ASA (started eliquis)    3. Pain Management: will add low dose gabapentin 100mg  bid for RUE neuropathic pain  -tylenol prn. Well controlled.   4. Mood: LCSW to follow for evaluation and support.              -antipsychotic agents: N/a 5. Neuropsych: This patient is not fully capable of making decisions on her  own behalf. 6. Skin/Wound Care: Routine pressure relief measures. IVs may be removed. 7. Fluids/Electrolytes/Nutrition: Monitor I/O. Na is 138 8/16  -prealbumin hovering around 16   -continue remeron at HS for appetite  - po intake inconsistent but improving, family bringing food in too  8/24 dietary consult requested yesterday re: low prealbumin, inadequate intake. Patient will need cueing and encouragement to maintain adequate intake. I have spoken to her daughter in this regard  already. Filed Weights   05/24/20 0544 05/25/20 0454 05/26/20 0525  Weight: 76 kg 74.1 kg 73.1 kg    8/25: weight decreased 8. A fib with RVR: Monitor HR bid--continue metoprolol 75 mg bid.   -HR 70's-90's  - eliquis    8/23: tachycardic to 104. May have been due to holding of lopressor. Lopressor reduced to 50mg  bid  8/24 bp/hr well controlled today. HR also needs to be recorded apically to record a accurate rate  8/25: BP and HR well controlled 9. HTN: Monitor BP tid.  On Lopressor changed to 50mg  BID 8/23.     Vitals:   05/26/20 2023 05/27/20 0435  BP: 103/75 113/69  Pulse: 68 75  Resp: 15 14  Temp: 98.2 F (36.8 C) 98 F (36.7 C)  SpO2: 100% 100%  8/24--well controlled 10 Dysphagia: On Dysphagia 2,  thin liquids. Encourage intake.   -family can bring in food from home with in dietary restrictions.  11. ETOH abuse: Has not had issues with withdrawal.  12. Prediabetes: Hgb A1c-6.1.   -cbg's well controlled 13. ABLA: Hgb 11.1 8/16, down to 10.7 on 8/23.    -add fe+ supp  -normocytic, likely d/t poor nutritional status LOS: 27 days A FACE TO FACE EVALUATION WAS PERFORMED  Clint Bolder P Natavia Sublette 05/27/2020, 11:38 AM

## 2020-05-27 NOTE — Progress Notes (Signed)
Speech Language Pathology Daily Session Note  Patient Details  Name: Lindsey [name] MRN: [id number] Date of Birth: 1956/02/19  Today's Date: 05/27/2020 SLP Individual Time: 1100-1200 SLP Individual Time Calculation (min): 60 min  Short Term Goals: Week 4: SLP Short Term Goal 1 (Week 4): STGs=LTGs due to ELOS  Skilled Therapeutic Interventions: Skilled treatment session focused on communication and dysphagia goals. SLP facilitated session by providing skilled observation with trial tray of Dys. 3 textures with thin liquids. Patient demonstrated mildly prolonged mastication with mild oral residue that patient required Min verbal cues to self-monitor and correct. Patient declined ongoing trials of Dys. 3 chicken and reported, "that ain't gonna cut it" while grimacing with mastication. Therefore, recommend ongoing trials prior to upgrade. Patient requested to use the bathroom and was continent of bowel and bladder. Patient answered basic yes/no questions with 90% accuracy and complex yes/no questions with 70% accuracy. Patient also followed 2-step commands with Min verbal cues due to apraxia. Patient demonstrated increased spontaneous verbalizations with phonemic paraphasis and had a basic conversation for ~5 turns. Patient transferred back to bed at end of session. Patient left supine in bed with alarm on and all needs within reach. Continue with current plan of care.       Pain No/Denies Pain   Therapy/Group: Individual Therapy  Cailie Bosshart 05/27/2020, 12:22 PM

## 2020-05-27 NOTE — Progress Notes (Signed)
Occupational Therapy Session Note  Patient Details  Name: Lindsey [name] MRN: [id number] Date of Birth: March 13, 1956  Today's Date: 05/27/2020 OT Individual Time: 9021-1155 OT Individual Time Calculation (min): 36 min    Short Term Goals: Week 4:  OT Short Term Goal 1 (Week 4): STG=LTG d/t ELOS  Skilled Therapeutic Interventions/Progress Updates:    Pt received supine with no c/o pain, agreeable to OT session. Pt completed bed mobility with min A and use of bedrails. Pt completed stand pivot transfer to the w/c with min A. Pt was taken to the therapy gym total A for time management. Pt completed sit > stand from the w/c and her RUE was facilitated in extension to promote increased weightbearing. Pt reported pain initially and with repositioning she was comfortable with the increased weightbearing provided by her reaching across midline. Min stabilization provided for standing balance support as well. 4x trials completed before pt was fatigued. Pt was taken back to her room and encouraged to complete toileting tasks which she agreed to. She completed a stand pivot transfer to the toilet with min A, and completed CM with min A. She voided urine. Pt completed peri hygiene in standing with min A and new brief was donned. Pt returned to w/c, completed hand hygiene at the sink and then returned to supine in bed. Pt was left with all needs met, bed alarm set.   Therapy Documentation Precautions:  Precautions Precautions: Fall Precaution Comments: R neglect, R hemi Restrictions Weight Bearing Restrictions: No  Therapy/Group: Individual Therapy  Curtis Sites 05/27/2020, 6:47 AM

## 2020-05-27 NOTE — Progress Notes (Addendum)
Patient ID: Lindsey [name], female   DOB: July 07, 1956, 64 y.o.   MRN: [id number]   Per therapy, RW needed for pt. SW sent order to [facility] via parachute.   Pt entered into [facility] medication assistance program.   SW waiting on follow-up from [facility] to discuss charity care for HHPT/OT/SLP.  Cecile Sheerer, MSW, LCSWA Office: [phone] Cell: [phone] Fax: [phone]

## 2020-05-28 ENCOUNTER — Encounter (HOSPITAL_COMMUNITY): Payer: Self-pay

## 2020-05-28 ENCOUNTER — Encounter (HOSPITAL_COMMUNITY): Payer: Self-pay | Admitting: Speech Pathology

## 2020-05-28 ENCOUNTER — Ambulatory Visit (HOSPITAL_COMMUNITY): Payer: Self-pay

## 2020-05-28 MED ORDER — MEGESTROL ACETATE 400 MG/10ML PO SUSP
400.0000 mg | Freq: Two times a day (BID) | ORAL | Status: DC
  Administered 2020-05-28 (×2): 400 mg via ORAL
  Filled 2020-05-28 (×3): qty 10

## 2020-05-28 NOTE — Progress Notes (Signed)
Occupational Therapy Discharge Summary  Patient Details  Name: Lindsey [name] MRN: [id number] Date of Birth: [birth year]  Today's Date: 05/28/2020 OT Individual Time: 2094-7096 OT Individual Time Calculation (min): 58 min    Pt received in bed agreeable to OT. Daughter calling at beginning of session that grandson felt comfortable with all aspects of OT and would not be present at family ed session today to make sure he could take off to be home when pt discharges. Pt agreeable to dressing EOB this date with S for UB and max VC for adaptive bra donning technique and min VC for hemi dressing for pull over shirt. Pt able to sit to stand multiple times at EOB for changing pants with MIN A and VC for hand placement to push up from bed surface. Pt completes LB dressing overall at MIN A level for standing balance. Pt completes grooming with S overall at sink level with VC for adpative strategy to use RUE as stabilizer to apply toothpaste. Estim applied to R wrist extensors for 60 min unattended and OT writes up formal DC instructions for family over ADLs and hemi stretching techniques. Exited sesion with pt seated in w/c, belt alarm on and call light in reach  [place] Stim One 330 pulse width 35 Hz pulse rate On 8 sec/ off 8 sec Ramp up/ down 2 sec Symmetrical Biphasic wave form  Max intensity 157m at 500 Ohm load 60 min with skin in tact after stim session. Pt tolerated well   Patient has met 13 of 13 long term goals due to improved activity tolerance, improved balance, postural control, ability to compensate for deficits, functional use of  RIGHT upper and RIGHT lower extremity, improved attention, improved awareness and improved coordination.  [facility].  Patient's care partner is independent to provide the necessary physical and cognitive assistance at discharge. Pt daughter, grand son and family friend participated in multiple days of family educaiton with hands  on practice of transfers and ADLs. Written instrucitons also provided.   Reasons goals not met: n/a  Recommendation:  Patient will benefit from ongoing skilled OT services in home health setting to continue to advance functional skills in the area of BADL, iADL and Reduce care partner burden.  Equipment: DABSC  Reasons for discharge: treatment goals met  Patient/family agrees with progress made and goals achieved: Yes  OT Discharge Precautions/Restrictions  Precautions Precautions: Fall Precaution Comments: R inattention, R hemi Restrictions Weight Bearing Restrictions: No General   Vital Signs Therapy Vitals Temp: 98.4 F (36.9 C) Pulse Rate: (!) 102 BP: 105/72 Patient Position (if appropriate): Lying Oxygen Therapy SpO2: 100 % O2 Device: Room Air Pain Pain Assessment Pain Score: 0-No pain ADL ADL Grooming: Supervision/safety Where Assessed-Grooming: Sitting at sink Upper Body Bathing: Supervision/safety Where Assessed-Upper Body Bathing: Shower Lower Body Bathing: Minimal assistance Where Assessed-Lower Body Bathing: Shower Upper Body Dressing: Supervision/safety Where Assessed-Upper Body Dressing: Wheelchair Lower Body Dressing: Minimal assistance Where Assessed-Lower Body Dressing: Wheelchair Toileting: Minimal assistance Where Assessed-Toileting: Toilet, BRecruitment consultantTransfer: Minimal aPrint production plannerMethod: TTheatre manager Drop arm bGeophysical data processor Dependent (stedy) Vision Baseline Vision/History: Wears glasses Wears Glasses: At all times Patient Visual Report: No change from baseline Vision Assessment?: Yes Eye Alignment: Within Functional Limits (Simultaneous filing. User may not have seen previous data.) Ocular Range of Motion: Within Functional Limits Alignment/Gaze Preference: Within Defined Limits Tracking/Visual Pursuits: Decreased smoothness of horizontal  tracking;Decreased smoothness of vertical  tracking;Decreased smoothness of eye movement to RIGHT superior field;Decreased smoothness of eye movement to RIGHT inferior field;Decreased smoothness of eye movement to LEFT superior field;Decreased smoothness of eye movement to LEFT inferior field;Requires cues, head turns, or add eye shifts to track Saccades: Additional eye shifts occurred during testing;Additional head turns occurred during testing;Decreased speed of saccadic movement Perception  Perception: Impaired (Simultaneous filing. User may not have seen previous data.) Inattention/Neglect: Does not attend to right side of body;Does not attend to right visual field (significant improvement during admission) Praxis Praxis: Impaired (Simultaneous filing. User may not have seen previous data.) Praxis Impairment Details: Motor planning Cognition Overall Cognitive Status: Impaired/Different from baseline Arousal/Alertness: Awake/alert Orientation Level: Oriented X4 Attention: Selective Focused Attention: Appears intact Sustained Attention: Appears intact Selective Attention: Impaired Selective Attention Impairment: Functional basic Memory: Impaired Memory Impairment: Decreased recall of new information;Decreased short term memory Decreased Short Term Memory: Functional basic Awareness: Impaired Awareness Impairment: Emergent impairment Problem Solving: Impaired Problem Solving Impairment: Functional basic Safety/Judgment: Impaired Sensation Sensation Light Touch: Appears Intact Hot/Cold: Appears Intact Proprioception: Impaired by gross assessment Coordination Gross Motor Movements are Fluid and Coordinated: No Fine Motor Movements are Fluid and Coordinated: No Coordination and Movement Description: dense R hemi RUE>LE Motor  Motor Motor: Abnormal postural alignment and control;Hemiplegia Motor - Skilled Clinical Observations: dense R hemi UE>LE Mobility  Bed Mobility Supine to  Sit: Contact Guard/Touching assist Transfers Sit to Stand: Minimal Assistance - Patient > 75% Stand to Sit: Minimal Assistance - Patient > 75%  Trunk/Postural Assessment  Cervical Assessment Cervical Assessment:  (head forward) Thoracic Assessment Thoracic Assessment:  (rounded shoulders) Lumbar Assessment Lumbar Assessment:  (post pelvic tilt) Postural Control Postural Control: Deficits on evaluation Righting Reactions: impaired improved since eval Protective Responses: impaired improved since eval  Balance Static Sitting Balance Static Sitting - Balance Support: Feet supported Static Sitting - Level of Assistance: 5: Stand by assistance Static Standing Balance Static Standing - Level of Assistance: 4: Min assist Extremity/Trunk Assessment RUE Assessment RUE Assessment: Exceptions to [facility] RUE Body System: Neuro Brunstrum levels for arm and hand: Arm;Hand Brunstrum level for arm: Stage I Presynergy Brunstrum level for hand: Stage I Flaccidity LUE Assessment LUE Assessment: Within Functional Limits   Tonny Branch 05/28/2020, 6:57 AM

## 2020-05-28 NOTE — Progress Notes (Addendum)
Physical Therapy Discharge Summary  Patient Details  Name: Lindsey [name] MRN: [id number] Date of Birth: 08/14/1956  Today's Date: 05/28/2020 PT Individual Time: 1100-1155 PT Individual Time Calculation (min): 55 min    Patient has met 10 of 11 long term goals due to improved activity tolerance, improved balance, improved postural control, increased strength, ability to compensate for deficits, functional use of  right lower extremity, improved attention and improved awareness.  Patient to discharge at a wheelchair level [place].   Patient's care partner is independent to provide the necessary physical and cognitive assistance at discharge.  Reasons goals not met: Patient continues to require intermittent min A for R LE and trunk management with bed mobility. Patient's family has demonstrated that they can safely provide this level of assist at d/c.  Recommendation:  Patient will benefit from ongoing skilled PT services in home health setting to continue to advance safe functional mobility, address ongoing impairments in R UE/LE strength and tone management, balance, functional mobility, gait training, activity tolerance, R attention, patient/caregiver education, and minimize fall risk.  Equipment: hospital bed, RW, slide board, 18"x18" light weight w/c with R lap tray  Reasons for discharge: treatment goals met  Patient/family agrees with progress made and goals achieved: Yes   Skilled Therapeutic Intervention: Patient in w/c in the room upon PT arrival. Patient alert and agreeable to PT session. Patient denied pain during session. Patient excited to go home tomorrow. Patient verbalized several phrases about going home throughout session.   Therapeutic Activity: Bed Mobility: Patient performed sit to supine with CGA-min A for R LE management only. Provided verbal cues for lifting R knee to chest to lift R LE onto the bed. Transfers: Patient performed sit to/from stand x1 using the RW  with R hand splint and x1 without an AD with CGA. First attempt with RW patient was too far back in the w/c and did not perform forward weight shift and was unable to come to standing. Provided verbal cues for scooting forward, forward weight shift, and R foot placement and patient was able to come to standing.  Gait Training:  Patient ambulated 70 feet using RW with R hand splint and R GRAFO  To prevent knee buckling and heel lift to prevent extensor thrust due to decreased R knee stability in stance with min A for trunk support, facilitation of L weight shift, facilitation of R foot placement and limb advancement, and management of RW. Patient able to advance and place her R LE independently ~50% of the time today. Ambulated as described below. Provided verbal cues for sequencing, R foot advancement and placement, L weight shift, and .  Neuromuscular Re-ed: Patient performed the following standing balance activities: -stood without UE support 1 min with CGA and 34 sec with supervision standing in front of a mirror for visual feedback -performed reaching slightly outside of BOS to visual target with L UE without UE support with CGA-min A for balance throughout x2 min -performed Berg Balance Scale, see details below.  Patient demonstrates increased fall risk as noted by score of  9/56 on Berg Balance Scale.  (<36= high risk for falls, close to 100%; 37-45 significant >80%; 46-51 moderate >50%; 52-55 lower >25%)  Patient in bed at end of session with breaks locked, bed alarm set, and all needs within reach.   PT Discharge Precautions/Restrictions Precautions Precautions: Fall Precaution Comments: R inattention, R hemi Restrictions Weight Bearing Restrictions: No Vision/Perception  Vision - Assessment Eye Alignment: Within Functional Limits (Simultaneous  filing. User may not have seen previous data.) Ocular Range of Motion: Within Functional Limits Alignment/Gaze Preference: Within Defined  Limits Tracking/Visual Pursuits: Decreased smoothness of horizontal tracking;Decreased smoothness of vertical tracking;Decreased smoothness of eye movement to RIGHT superior field;Decreased smoothness of eye movement to RIGHT inferior field;Decreased smoothness of eye movement to LEFT superior field;Decreased smoothness of eye movement to LEFT inferior field;Requires cues, head turns, or add eye shifts to track Saccades: Additional eye shifts occurred during testing;Additional head turns occurred during testing;Decreased speed of saccadic movement Perception Perception: Impaired (Simultaneous filing. User may not have seen previous data.) Inattention/Neglect: Does not attend to right side of body;Does not attend to right visual field (significant improvement during admission) Praxis Praxis: Impaired (Simultaneous filing. User may not have seen previous data.) Praxis Impairment Details: Motor planning  Cognition Overall Cognitive Status: Impaired/Different from baseline Arousal/Alertness: Awake/alert Orientation Level: Oriented X4 Attention: Selective Focused Attention: Appears intact Sustained Attention: Appears intact Selective Attention: Impaired Selective Attention Impairment: Functional basic Memory: Impaired Memory Impairment: Decreased recall of new information;Decreased short term memory Decreased Short Term Memory: Functional basic Awareness: Impaired Awareness Impairment: Emergent impairment Problem Solving: Impaired Problem Solving Impairment: Functional basic Safety/Judgment: Impaired Sensation Sensation Light Touch: Appears Intact Hot/Cold: Appears Intact Proprioception: Impaired by gross assessment Coordination Gross Motor Movements are Fluid and Coordinated: No Fine Motor Movements are Fluid and Coordinated: No Coordination and Movement Description: dense R hemi RUE>LE Heel Shin Test: WFL on L, unable on R due to hemiplegia Motor  Motor Motor: Abnormal postural  alignment and control;Hemiplegia Motor - Skilled Clinical Observations: dense R hemi UE>LE  Mobility Bed Mobility Bed Mobility: Rolling Right;Rolling Left;Supine to Sit;Sit to Supine Rolling Right: Supervision/verbal cueing Rolling Left: Supervision/Verbal cueing Supine to Sit: Contact Guard/Touching assist;Minimal Assistance - Patient > 75% (assist for trunk support and R LE management intermittently) Sit to Supine: Contact Guard/Touching assist;Minimal Assistance - Patient > 75% (assist for R LE management intermittently) Transfers Transfers: Sit to Stand;Stand to [employer];Lateral/Scoot Transfers;Squat Pivot Transfers Sit to Stand: Contact Guard/Touching assist Stand to Sit: Contact Guard/Touching assist Stand Pivot Transfers: Moderate Assistance - Patient 50 - 74%;Minimal Assistance - Patient > 75% Stand Pivot Transfer Details: Verbal cues for technique;Verbal cues for gait pattern;Manual facilitation for weight shifting;Manual facilitation for placement;Verbal cues for precautions/safety;Tactile cues for sequencing Squat Pivot Transfers: Contact Guard/Touching assist;Minimal Assistance - Patient > 75% Lateral/Scoot Transfers: Contact Guard/Touching assist;Supervision/Verbal cueing (using slide board, requires max-total A for board placement) Transfer (Assistive device):  (L rail or grab bar) Locomotion  Gait Ambulation: Yes Gait Assistance: Minimal Assistance - Patient > 75% (w/c follow for safety) Gait Distance (Feet): 70 Feet Assistive device: Rolling walker;Other (Comment) (with R hand splint and L GRAFO with heel lift) Gait Assistance Details: Manual facilitation for weight shifting;Manual facilitation for placement;Verbal cues for gait pattern;Verbal cues for safe use of DME/AE;Verbal cues for precautions/safety;Verbal cues for technique;Verbal cues for sequencing Gait Assistance Details: only required facilitation for R LE placement ~50% of the time Gait Gait:  Yes Gait Pattern: Impaired Gait Pattern: Step-to pattern;Decreased stance time - right;Decreased stride length;Decreased hip/knee flexion - right;Decreased dorsiflexion - right;Decreased weight shift to right;Right hip hike;Right steppage;Right flexed knee in stance;Right genu recurvatum;Lateral hip instability;Lateral trunk lean to left;Decreased trunk rotation;Narrow base of support;Trunk flexed;Poor foot clearance - right Gait velocity: decreased Stairs / Additional Locomotion Stairs: No Wheelchair Mobility Wheelchair Mobility: Yes Wheelchair Assistance: Supervision/Verbal cueing Wheelchair Propulsion: Left upper extremity;Left lower extremity Wheelchair Parts Management: Needs assistance Distance: 150 ft  Trunk/Postural Assessment  Cervical Assessment Cervical Assessment: Exceptions to [facility] (head forward) Thoracic Assessment Thoracic Assessment: Exceptions to [facility] (rounded shoulders) Lumbar Assessment Lumbar Assessment: Exceptions to [facility] (post pelvic tilt) Postural Control Postural Control: Deficits on evaluation Righting Reactions: impaired improved since eval Protective Responses: impaired improved since eval  Balance Balance Balance Assessed: Yes Standardized Balance Assessment Standardized Balance Assessment: Berg Balance Test Berg Balance Test Sit to Stand: Needs minimal aid to stand or to stabilize Standing Unsupported: Able to stand 30 seconds unsupported Sitting with Back Unsupported but Feet Supported on Floor or Stool: Able to sit safely and securely 2 minutes Stand to Sit: Sits independently, has uncontrolled descent Transfers: Needs one person to assist Standing Unsupported with Eyes Closed: Needs help to keep from falling Standing Ubsupported with Feet Together: Needs help to attain position and unable to hold for 15 seconds From Standing, Reach Forward with Outstretched Arm: Loses balance while trying/requires external support From Standing Position, Pick up  Object from Floor: Unable to try/needs assist to keep balance From Standing Position, Turn to Look Behind Over each Shoulder: Needs assist to keep from losing balance and falling Turn 360 Degrees: Needs assistance while turning Standing Unsupported, Alternately Place Feet on Step/Stool: Needs assistance to keep from falling or unable to try Standing Unsupported, One Foot in Front: Loses balance while stepping or standing Standing on One Leg: Unable to try or needs assist to prevent fall Total Score: 9/56 Static Sitting Balance Static Sitting - Balance Support: No upper extremity supported;Feet supported Static Sitting - Level of Assistance: 5: Stand by assistance Dynamic Sitting Balance Dynamic Sitting - Balance Support: No upper extremity supported;Feet supported;During functional activity Dynamic Sitting - Level of Assistance: 5: Stand by assistance Dynamic Sitting - Balance Activities: Lateral lean/weight shifting;Forward lean/weight shifting;Reaching for objects;Reaching across midline Static Standing Balance Static Standing - Balance Support: Left upper extremity supported Static Standing - Level of Assistance: 5: Stand by assistance (CGA) Static Standing - Comment/# of Minutes: >30 sec with close supervision Dynamic Standing Balance Dynamic Standing - Balance Support: Left upper extremity supported;During functional activity Dynamic Standing - Level of Assistance: 4: Min assist (CGA) Dynamic Standing - Balance Activities: Forward lean/weight shifting;Lateral lean/weight shifting;Reaching for objects;Reaching across midline Extremity Assessment  RUE Assessment RUE Assessment: Exceptions to [facility] RUE Body System: Neuro Brunstrum levels for arm and hand: Arm;Hand Brunstrum level for arm: Stage I Presynergy Brunstrum level for hand: Stage I Flaccidity LUE Assessment LUE Assessment: Within Functional Limits RLE Assessment RLE Assessment: Exceptions to [facility] Passive Range of Motion  (PROM) Comments: WFL Active Range of Motion (AROM) Comments: Full range active knee extension in sitting, otherwise <50% range for hip flexion/extension/abduction/adduction, no active DF/PF General Strength Comments: Grossly in sitting: 1/5 hip flexion and abduction, 3/5 hip adduction, 3+/5 knee extension, 2+/5 knee flexion, 0/5 DF/PF RLE Tone RLE Tone: Hypotonic Hypotonic Details: R hemiplegia with R inattention LLE Assessment LLE Assessment: Within Functional Limits Active Range of Motion (AROM) Comments: WFL for all functional mobility General Strength Comments: Grossly in sitting: 5/5 throughout    Terrianne Cavness L Alysiana Ethridge PT, DPT  05/28/2020, 12:44 PM

## 2020-05-28 NOTE — Progress Notes (Signed)
[place] PHYSICAL MEDICINE & REHABILITATION PROGRESS NOTE   Subjective/Complaints: I asked her if she was ready to get out of her. She responded, "Yes I am!"  ROS: limited due to language/communication. Doesn't appear to be having any new issues.   Objective:   No results found. No results for input(s): WBC, HGB, HCT, PLT in the last 72 hours. No results for input(s): NA, K, CL, CO2, GLUCOSE, BUN, CREATININE, CALCIUM in the last 72 hours.  Intake/Output Summary (Last 24 hours) at 05/28/2020 1122 Last data filed at 05/28/2020 0843 Gross per 24 hour  Intake 582 ml  Output 91 ml  Net 491 ml     Physical Exam: Vital Signs Blood pressure 105/72, pulse (!) 102, temperature 98.4 F (36.9 C), resp. rate 17, height 5\' 5"  (1.651 m), weight 73.1 kg, SpO2 100 %. Constitutional: No distress . Vital signs reviewed. HEENT: EOMI, oral membranes moist Neck: supple Cardiovascular: RRR without murmur. No JVD    Respiratory/Chest: CTA Bilaterally without wheezes or rales. Normal effort    GI/Abdomen: BS +, non-tender, non-distended Ext: no clubbing, cyanosis, or edema Psych: pleasant and cooperative Skin: Clean and intact without signs of breakdown Musculoskeletal: Full range of motion in all 4 extremities. No joint swelling seen. Right forearms still a little sensitive.  Neuro: expressive aphasia, apraxia. Often tieing more words together.  Right sided hemiplegia tr/5 RUE  and 1+ to 2-/5 HE, KE,  0/5 distally RLE. Left side 5/5-- subtle flexor tone RUE still present   Assessment/Plan: 1. 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  Physiatrist is providing close team supervision and 24 hour management of active medical problems listed below.  Physiatrist and rehab team continue to assess barriers to discharge/monitor patient progress toward functional and  medical goals  Care Tool:  Bathing    Body parts bathed by patient: Chest, Abdomen, Front perineal area, Left upper leg, Face, Right upper leg, Right lower leg, Right arm, Left lower leg, Buttocks   Body parts bathed by helper: Left arm     Bathing assist Assist Level: Minimal Assistance - Patient > 75%     Upper Body Dressing/Undressing Upper body dressing   What is the patient wearing?: Bra, Pull over shirt    Upper body assist Assist Level: Supervision/Verbal cueing    Lower Body Dressing/Undressing Lower body dressing      What is the patient wearing?: Incontinence brief, Pants     Lower body assist Assist for lower body dressing: Minimal Assistance - Patient > 75%     Toileting Toileting    Toileting assist Assist for toileting: Minimal Assistance - Patient > 75%     Transfers Chair/bed transfer  Transfers assist  Chair/bed transfer activity did not occur: Safety/medical concerns  Chair/bed transfer assist level: Minimal Assistance - Patient > 75% Chair/bed transfer assistive device: Armrests   Locomotion Ambulation   Ambulation assist   Ambulation activity did not occur: Safety/medical concerns  Assist level: Minimal Assistance - Patient > 75% Assistive device: Other (comment) (L rail) Max distance: 30 ft   Walk 10 feet activity   Assist  Walk 10 feet activity did not occur: Safety/medical concerns  Assist level: Minimal Assistance - Patient > 75% Assistive device: Other (comment) (L hallway rail)   Walk 50 feet activity   Assist Walk 50 feet with 2 turns activity did not occur: Safety/medical concerns  Walk 150 feet activity   Assist Walk 150 feet activity did not occur: Safety/medical concerns         Walk 10 feet on uneven surface  activity   Assist Walk 10 feet on uneven surfaces activity did not occur: Safety/medical concerns         Wheelchair     Assist Will patient use wheelchair at discharge?:  Yes Type of Wheelchair: Manual    Wheelchair assist level: Moderate Assistance - Patient 50 - 74% Max wheelchair distance: 150 ft    Wheelchair 50 feet with 2 turns activity    Assist        Assist Level: Moderate Assistance - Patient 50 - 74%   Wheelchair 150 feet activity     Assist      Assist Level: Moderate Assistance - Patient 50 - 74%   Blood pressure 105/72, pulse (!) 102, temperature 98.4 F (36.9 C), resp. rate 17, height 5\' 5"  (1.651 m), weight 73.1 kg, SpO2 100 %.    Medical Problem List and Plan: 1.  R hemiplegia and impaired function/expressive aphasia and R inattention secondary to L MCA stroke with hemorrhagic extension- with dysphagia- on D2 with thins  -patient may  shower             -ELOS/Goals: 8/27, goals Mod I to min A  -Patient to see me in the office for transitional care encounter in 1-2 weeks.  2.  Antithrombotics: -DVT/anticoagulation:  started eliquis on 8/11 per discussion with neurology             -antiplatelet therapy:  d/c'ed ASA (started eliquis)    3. Pain Management: will add low dose gabapentin 100mg  bid for RUE neuropathic pain  -tylenol prn. Well controlled.   4. Mood: LCSW to follow for evaluation and support.              -antipsychotic agents: N/a 5. Neuropsych: This patient is not fully capable of making decisions on her  own behalf. 6. Skin/Wound Care: Routine pressure relief measures. IVs may be removed. 7. Fluids/Electrolytes/Nutrition: Monitor I/O. Na is 138 8/16  -prealbumin hovering around 16   -continue remeron at HS for appetite  - po intake inconsistent but improving, family bringing food in too  8/24 dietary consult appreciated Filed Weights   05/24/20 0544 05/25/20 0454 05/26/20 0525  Weight: 76 kg 74.1 kg 73.1 kg    8/25-26: weight decreased  -daughter/family will really need to push her from an intake standpoint. She won't initiate enough on her own. I have spoken to them in them in this regard.   -I  will begin her on megace and send her home on it (at least initially) 8. A fib with RVR: Monitor HR bid--continue metoprolol 75 mg bid.   -HR 70's-90's  - eliquis    8/23: tachycardic to 104. May have been due to holding of lopressor. Lopressor reduced to 50mg  bid  8/24 bp/hr well controlled today. HR also needs to be recorded apically to record a accurate rate  8/26: BP  l controlled 9. HTN: Monitor BP tid.  On Lopressor changed to 50mg  BID 8/23.     Vitals:   05/27/20 1949 05/28/20 0603  BP: 107/66 105/72  Pulse: 76 (!) 102  Resp:    Temp: 99.8 F (37.7 C) 98.4 F (36.9 C)  SpO2: 100% 100%  8/26--generally has been controlled. Elevated this morning on VS but only in 70's when I auscultated 10 Dysphagia: On Dysphagia 2,  thin liquids. Encourage intake.   -family can bring in food from home with in dietary restrictions.  11. ETOH abuse: Has not had issues with withdrawal.  12. Prediabetes: Hgb A1c-6.1.   -cbg's well controlled 13. ABLA: Hgb 11.1 8/16, down to 10.7 on 8/23.    -add fe+ supp  -normocytic, likely d/t poor nutritional status LOS: 28 days A FACE TO FACE EVALUATION WAS PERFORMED  Ranelle Oyster 05/28/2020, 11:22 AM

## 2020-05-28 NOTE — Progress Notes (Signed)
Speech Language Pathology Discharge Summary  Patient Details  Name: Lindsey [name] MRN: [id number] Date of Birth: Feb 21, 1956  Today's Date: 05/28/2020 SLP Individual Time: 1000-1055 SLP Individual Time Calculation (min): 55 min   Skilled Therapeutic Interventions:  Skilled treatment session focused on communication goals. SLP facilitated session by overall Mod-Max A sentence completion cues for word-finding during a structured naming task and Min A sentence completion cues to verbalize the similarities between the 2 objects. Patient continues to demonstrate improved spontaneous verbal output and had a basic conversation regarding her job that she was ~100% intelligible for ~3 turns. Patient left upright in the wheelchair with alarm on and all needs within reach.   Patient has met 9 of 10 long term goals.  Patient to discharge at overall Supervision;Min level.   Reasons goals not met: Patient remains on Dys. 2 textures   Clinical Impression/Discharge Summary: Patient has made excellent gains and has met 9 of 10 LTGs this admission. Although patient remains on Dys. 2 textures, patient demonstrates improved utilization of swallowing compensatory strategies and decreased overt s/s of aspiration with current diet of Dys. 2 textures with thin liquids. Patient demonstrates improved overall auditory comprehension and can answer basic yes/no questions with 90% accuracy and complex, abstract questions with 70% accuracy. Patient also demonstrates improved ability to follow 1 and 2 step commands but function can be impacted by motor apraxia at times. Patient utilizes multimodal communication (mostly gestures due to deficits in reading and writing at the word level) but demonstrates improved ability to name functional items at the word level and spontaneous verbalizations to make needs known during functional situations. 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 Patient's attention appears [facility] for all tasks but deficits in carryover of information are noted during functional tasks like dressing.  Patient and family education is complete and patient will discharge home with supervision from family. Patient would benefit from f/u SLP services to maximize her swallowing and cognitive functioning as well as her functional communication in order to reduce caregiver burden.   Care Partner:  Caregiver Able to Provide Assistance: Yes  Type of Caregiver Assistance: Physical;Cognitive  Recommendation:  Home Health SLP;24 hour supervision/assistance  Rationale for SLP Follow Up: Maximize functional communication;Maximize cognitive function and independence;Maximize swallowing safety;Reduce caregiver burden   Equipment: N/A   Reasons for discharge: Discharged from hospital;Treatment goals met   Patient/Family Agrees with Progress Made and Goals Achieved: Yes    Egon Dittus, [place] 05/28/2020, 10:59 AM

## 2020-05-28 NOTE — Progress Notes (Signed)
Patient ID: Lindsey [name], female   DOB: 06/08/1956, 64 y.o.   MRN: [id number]  SW waiting on follow-up from [facility] to discuss charity care for HHPT/OT/SLP/aide.  SW received approval for LOG from [facility] Supervisor [employer]. SW sent out referral for LOG. [facility] reviewing and will follow-up.   Declined LOG: [facility]-No LOG [facility]- no LOG [facility]-unable to accept  SW called pt dtr [name] to inform on challenges with obtaining HH under charity and informed if unable to obtain. SW will continue to follow once there is an agency able to accept. Reports concerns related to scheduling follow-up appt at [facility], and if they will discuss with her pt care needs. Dtr does intend to accompany pt at time of appt. SW to schedule follow-up appt. Dtr states she picked up wheelchair from retial store, and that RW, DABSC and lap tray should be delivered to pt room.  *SW confirmed DME delivered to room. SW received updates from [facility] reporting able to accept referral pt for charity. SW followed up with Tiffany[facility] to withdraw LOG referral.   SW scheduled hospital follow-up appt on September 9 at 10:20am with Dr. Letta Pate at [facility] [phone]).   Cecile Sheerer, MSW, LCSWA Office: [phone] Cell: [phone] Fax: [phone]

## 2020-05-29 MED ORDER — APIXABAN 5 MG PO TABS: 5.0000 mg | ORAL_TABLET | Freq: Two times a day (BID) | ORAL | 0 refills | Status: DC

## 2020-05-29 MED ORDER — GABAPENTIN 100 MG PO CAPS: 100.0000 mg | ORAL_CAPSULE | Freq: Two times a day (BID) | ORAL | 0 refills | Status: DC

## 2020-05-29 MED ORDER — AMLODIPINE BESYLATE 10 MG PO TABS: 10.0000 mg | ORAL_TABLET | Freq: Every day | ORAL | 0 refills | Status: DC

## 2020-05-29 MED ORDER — ACETAMINOPHEN 325 MG PO TABS: 325.0000 mg | ORAL_TABLET | ORAL | Status: DC | PRN

## 2020-05-29 MED ORDER — ORAL CARE MOUTH RINSE: 15.0000 mL | Freq: Two times a day (BID) | OROMUCOSAL | 0 refills | Status: DC

## 2020-05-29 MED ORDER — DICLOFENAC SODIUM 1 % EX GEL: 2.0000 g | Freq: Three times a day (TID) | CUTANEOUS | 0 refills | Status: DC

## 2020-05-29 MED ORDER — MEGESTROL ACETATE 400 MG/10ML PO SUSP: 400.0000 mg | Freq: Two times a day (BID) | ORAL | 0 refills | Status: DC

## 2020-05-29 MED ORDER — FE FUMARATE-B12-VIT C-FA-IFC PO CAPS: 1.0000 | ORAL_CAPSULE | Freq: Two times a day (BID) | ORAL | 0 refills | Status: DC

## 2020-05-29 MED ORDER — MIRTAZAPINE 15 MG PO TBDP: 15.0000 mg | ORAL_TABLET | Freq: Every day | ORAL | 0 refills | Status: DC

## 2020-05-29 MED ORDER — MELATONIN 3 MG PO TABS: 3.0000 mg | ORAL_TABLET | Freq: Every evening | ORAL | 0 refills | Status: DC | PRN

## 2020-05-29 MED ORDER — ATORVASTATIN CALCIUM 40 MG PO TABS: 40.0000 mg | ORAL_TABLET | Freq: Every day | ORAL | 0 refills | Status: DC

## 2020-05-29 MED ORDER — METOPROLOL TARTRATE 50 MG PO TABS: 50.0000 mg | ORAL_TABLET | Freq: Two times a day (BID) | ORAL | 0 refills | Status: DC

## 2020-05-29 MED FILL — AMLODIPINE BESYLATE 10 MG T: 10 | 30 days supply | Qty: 30 | Fill #0

## 2020-05-29 MED FILL — METOPROLOL TARTRATE 50 MG T: 50 | 30 days supply | Qty: 60 | Fill #0

## 2020-05-29 MED FILL — FEROCON CAPSULE: 30 days supply | Qty: 60 | Fill #0

## 2020-05-29 MED FILL — ATORVASTATIN CALCIUM 40 MG: 40 | 30 days supply | Qty: 30 | Fill #0

## 2020-05-29 MED FILL — GABAPENTIN 100 MG CAPSULE: 100 | 30 days supply | Qty: 60 | Fill #0

## 2020-05-29 MED FILL — MEGESTROL ACET 40 MG/ML SUS: 40 | 12 days supply | Qty: 240 | Fill #0

## 2020-05-29 MED FILL — DICLOFENAC SODIUM 1 % GEL: 1 | 30 days supply | Qty: 300 | Fill #0

## 2020-05-29 MED FILL — MIRTAZAPINE 15 MG TABLET: 15 | 30 days supply | Qty: 30 | Fill #0

## 2020-05-29 MED FILL — MELATONIN 3 MG TABS: 3 | 30 days supply | Qty: 30 | Fill #0

## 2020-05-29 MED FILL — ELIQUIS 5 MG TABLET: 5 | 30 days supply | Qty: 60 | Fill #0

## 2020-05-29 NOTE — Discharge Summary (Signed)
Physician Discharge Summary  Patient ID: Lindsey [name] MRN: [id number] DOB/AGE: 1956/06/08 64 y.o.  Admit date: 04/30/2020 Discharge date: 05/29/2020  Discharge Diagnoses:  Principal Problem:   Right hemiplegia [facility]) Active Problems:   Unspecified atrial fibrillation (HCC)   Essential hypertension   Acute ischemic left MCA stroke (HCC)   Anemia   Discharged Condition: Stable   Significant Diagnostic Studies: CT HEAD WO CONTRAST  Result Date: 05/11/2020 CLINICAL DATA:  Stroke, follow-up. EXAM: CT HEAD WITHOUT CONTRAST TECHNIQUE: Contiguous axial images were obtained from the base of the skull through the vertex without intravenous contrast. COMPARISON:  Prior head CT examinations 05/04/2020 and earlier, brain MRI 04/21/2020 FINDINGS: Brain: A subacute ischemic infarct within the left MCA vascular territory has not significantly changed in extent. Persistent although decreased multifocal petechial hemorrhage within the infarction territory. Persistent mild mass effect with trace 1-2 mm rightward midline shift. No new intracranial hemorrhage is demonstrated. No interval demarcated infarct is identified. No extra-axial fluid collection. No evidence of intracranial mass. Vascular: No new finding. Skull: Normal. Negative for fracture or focal lesion. Sinuses/Orbits: Visualized orbits show no acute finding. Partial opacification of a right ethmoid air cell. No significant mastoid effusion IMPRESSION: Evolving subacute left MCA territory infarct, unchanged in extent. Persistent although decreased mild petechial hemorrhage. 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 Persistent mild mass effect with trace 1-2 mm rightward midline shift. Electronically Signed   By: Jackey Loge DO   On: 05/11/2020 07:32   CT HEAD WO CONTRAST  Result Date: 05/04/2020 CLINICAL DATA:  MCA stroke with hemorrhage, follow-up EXAM: CT HEAD WITHOUT CONTRAST TECHNIQUE: Contiguous axial images were obtained from the base of  the skull through the vertex without intravenous contrast. COMPARISON:  04/24/2020 FINDINGS: Brain: Evolving left MCA territory infarction is again identified. Some improvement in areas of hyperdensity seen on the prior study with other new areas of hyperdensity. There remains mild regional mass effect and partial effacement the left lateral ventricle. No significant midline shift. No hydrocephalus. No new loss of gray-white differentiation. Vascular: No new finding. Skull: Calvarium is unremarkable. Sinuses/Orbits: No acute finding. Other: None. IMPRESSION: Evolving subacute left MCA territory infarction. Persistent mild residual mass effect. Some new areas of hyperdensity may reflect laminar necrosis and/or hemorrhage. Electronically Signed   By: Guadlupe Spanish M.D.   On: 05/04/2020 08:14    Labs:  Basic Metabolic Panel: BMP Latest Ref Rng & Units 05/25/2020 05/18/2020 05/11/2020  Glucose 70 - 99 mg/dL 350(K) 938(H) 829(H)  BUN 8 - 23 mg/dL 9 11 11   Creatinine 0.44 - 1.00 mg/dL 3.71 6.96  Sodium 135 - 145 mmol/L 139 138 135  Potassium 3.5 - 5.1 mmol/L 4.0 3.9 4.2  Chloride 98 - 111 mmol/L 104 105 101  CO2 22 - 32 mmol/L 24 21(L) 22  Calcium 8.9 - 10.3 mg/dL 9.3 9.3 9.4    CBC: CBC Latest Ref Rng & Units 05/25/2020 05/18/2020 05/11/2020  WBC 4.0 - 10.5 K/uL 4.0 3.6(L) 4.3  Hemoglobin 12.0 - 15.0 g/dL 10.7(L) 11.1(L) 11.7(L)  Hematocrit 36 - 46 % 32.9(L) 34.4(L) 34.8(L)  Platelets 150 - 400 K/uL 287 248 455(H)    CBG: No results for input(s): GLUCAP in the last 168 hours.  Brief HPI:   Lindsey [name] is a 64 y.o. female with history of HTN, alcohol abuse who was admitted on 04/21/2020 with right-sided weakness and slurred speech.  She was found to have dysarthria, aphasia, left gaze preference and right hemiparesis and also noted to be in A.  fib with RVR.  UDS positive for THC.  CT head showed large MCA infarct and CTA brain revealed a left M2 occlusion with patient was out of window for  thrombolysis.  Follow-up MRI brain later that day showed acute L-MCA infarct with mild hemorrhage in caudate, putamen and left frontal lobe.  Dr. Pearlean Brownie felt that stroke was embolic in setting of A. fib and ASA added on 07/21 with recommended conditions repeat CT head in 7 to 10 days to decide on initiation of AC.  Patient has had issues with A. fib with flutter and IV metoprolol added for rate control.  She continued to have limitations due to aphasia and dense right hemiplegia.  Therapy ongoing and CIR recommended due to functional decline.   Hospital Course: Lindsey [name] was admitted to rehab 04/30/2020 for inpatient therapies to consist of PT, ST and OT at least three hours five days a week. Past admission physiatrist, therapy team and rehab RN have worked together to provide customized collaborative inpatient rehab. Heart rate was monitored on 3 times daily basis and started trending low therefore metoprolol was decreased to 50 mg twice daily.  Blood pressures are well controlled and occasional lows noted.  Recommend further titration of medication after discharge.  Her intake was noted to be variable therefore Megace was added for supplement.  Remeron was also added to help with mood, insomnia as well as appetite.  Family was encouraged to bring additional food from home to help with intake.  No signs of alcohol withdrawal noted.  CT was repeated on 08/09 and she was cleared to start Eliquis on 08/11 after discussion with neurology.  She is tolerating this without side effects with repeat CBC showing slight drop in hemoglobin but no signs of bleeding noted.  Iron supplement added and recommend repeat CBC to monitor for stability in a couple of weeks after discharge.  No signs of alcohol withdrawal noted during the stay.  She is tolerating dysphagia 2 diet thin liquids without difficulty.  Low-dose gabapentin added to help manage RUE neuropathic pain.  She has made gains during her rehab stay and is  currently at min assist level.  She will continue to receive follow-up therapy after discharge   Rehab course: During patient's stay in rehab weekly team conferences were held to monitor patient's progress, set goals and discuss barriers to discharge. At admission, patient required max assist with ADL tasks and mod assist with mobility. She exhibited severe expressive-receptive aphasia, mild dysarthria and moderate oropharyngeal dysphagia. She  has had improvement in activity tolerance, balance, postural control as well as ability to compensate for deficits. She has had improvement in functional use RUE  and RLE as well as improvement in awareness. She is able to complete ADL tasks with min assist. She is able to perform transfers with contact-guard assist.  She is ambulating 70 feet with a rolling walker and min assist.  She has had improvement in spontaneous verbal output and is currently at supervision to min assist level for speech as well as cognitive tasks. Family education was completed.   Disposition: Home  Diet: Dysphagia 2, thin liquids   Special Instructions: 1. Repeat CBC in 1-2 weeks to monitor H/H. 2. Family to provide min assist with tasks.   Discharge Instructions    Ambulatory referral to Physical Medicine Rehab   Complete by: As directed    1-2 weeks TC appt     Allergies as of 05/29/2020   No Known Allergies  Medication List    STOP taking these medications   aspirin 81 MG chewable tablet   feeding supplement (ENSURE ENLIVE) Liqd     TAKE these medications   acetaminophen 325 MG tablet Commonly known as: TYLENOL Take 1-2 tablets (325-650 mg total) by mouth every 4 (four) hours as needed for mild pain.   amLODipine 10 MG tablet Commonly known as: NORVASC Take 1 tablet (10 mg total) by mouth daily.   apixaban 5 MG Tabs tablet Commonly known as: ELIQUIS Take 1 tablet (5 mg total) by mouth 2 (two) times daily.   atorvastatin 40 MG tablet Commonly known  as: LIPITOR Take 1 tablet (40 mg total) by mouth daily at 6 PM. What changed: how to take this   diclofenac Sodium 1 % Gel Commonly known as: VOLTAREN Apply 2 g topically 3 (three) times daily.   ferrous fumarate-b12-vitamic C-folic acid capsule Commonly known as: TRINSICON / FOLTRIN Take 1 capsule by mouth 2 (two) times daily after a meal.   gabapentin 100 MG capsule Commonly known as: NEURONTIN Take 1 capsule (100 mg total) by mouth 2 (two) times daily.   megestrol 400 MG/10ML suspension Commonly known as: MEGACE Take 10 mLs (400 mg total) by mouth 2 (two) times daily.   melatonin 3 MG Tabs tablet Take 1 tablet (3 mg total) by mouth at bedtime as needed (insomnia).   metoprolol tartrate 50 MG tablet Commonly known as: LOPRESSOR Take 1 tablet (50 mg total) by mouth 2 (two) times daily. What changed:   medication strength  how much to take   mirtazapine 15 MG disintegrating tablet Commonly known as: REMERON SOL-TAB Take 1 tablet (15 mg total) by mouth at bedtime.   mouth rinse Liqd solution 15 mLs by Mouth Rinse route 2 times daily at 12 noon and 4 pm.       Follow-up Information    Ranelle Oyster, MD Follow up.   Specialty: Physical Medicine and Rehabilitation Why: Office will call you with follow up appt Contact information: [address] [place] [address] [phone]        [facility]. Call.   Why: on Monday for post stroke appointment Contact information: [address]     [address] [place] [address] [phone]       Emogene Morgan, MD .   Specialty: [facility] Medicine Contact information: [address] [place] [address] [phone]        [facility] Follow up on 06/11/2020.   Specialty: General Practice Why: Appointment at 10:20 am for primary care.  Contact information: [address] [place]  [address] [phone]               Signed: Jacquelynn Cree 05/31/2020, 11:24 PM

## 2020-05-29 NOTE — Progress Notes (Signed)
Patient appears more alert today, attempting to speak a few simple words clearly, appears frustrated when she can't communicate out  her speech clearly .provided support and reassurance. Continent of bladder/bowel, continue with time toileting, Encourage po intake but reluctant in consuming fluids at this time. Monitor and assisted, placed call bell within reach, bed alarm on

## 2020-05-29 NOTE — Progress Notes (Signed)
Inpatient Rehabilitation Care Coordinator  Discharge Note  The overall goal for the admission was met for:   Discharge location: Yes. D/c to home with her dtr Felicity Pellegrini and family support.   Length of Stay: Yes. 28 days.   Discharge activity level: Yes. Min Assist.  Home/community participation: Yes. Limited.   Services provided included: MD, RD, PT, OT, SLP, RN, CM, TR, Pharmacy, Neuropsych and SW  Financial Services: Other: Uninsured  Follow-up services arranged: Home Health: [facility] for PT/OT/SLP/aide (charity) and DME: [employer] [facility] for w/c with R lap tray, DABSC, hospital bed, transfer board   Hospital follow-up appt: September 9 at 10:20am with Dr. Clide Deutscher at [facility] [phone]).   Comments (or additional information): contact pt dtr [name] [phone].  Patient/Family verbalized understanding of follow-up arrangements: Yes  Individual responsible for coordination of the follow-up plan: Pt to have assistance with care needs.   Confirmed correct DME delivered: Rana Snare 05/29/2020    Rana Snare

## 2020-05-29 NOTE — Progress Notes (Signed)
Patient discharged off of unit with all belongings. Discharge papers/instructions explained by physician assistant to family. Patient and family have no further questions at time of discharge. No complications noted at this time.  Lindsey [name] L Lovelee Forner  

## 2020-05-29 NOTE — Plan of Care (Signed)
  Problem: Consults Goal: RH STROKE PATIENT EDUCATION Description: See Patient Education module for education specifics  Outcome: Completed/Met Goal: Nutrition Consult-if indicated Outcome: Completed/Met   Problem: RH BOWEL ELIMINATION Goal: RH STG MANAGE BOWEL WITH ASSISTANCE Description: STG Manage Bowel with mod Assistance. Outcome: Completed/Met Goal: RH STG MANAGE BOWEL W/MEDICATION W/ASSISTANCE Description: STG Manage Bowel with Medication with mod Assistance. Outcome: Completed/Met   Problem: RH BLADDER ELIMINATION Goal: RH STG MANAGE BLADDER WITH ASSISTANCE Description: STG Manage Bladder With max Assistance Outcome: Completed/Met Goal: RH STG MANAGE BLADDER WITH MEDICATION WITH ASSISTANCE Description: STG Manage Bladder With Medication With mod Assistance. Outcome: Completed/Met Goal: RH STG MANAGE BLADDER WITH EQUIPMENT WITH ASSISTANCE Description: STG Manage Bladder With Equipment With max/total Assistance Outcome: Completed/Met   Problem: RH SKIN INTEGRITY Goal: RH STG SKIN FREE OF INFECTION/BREAKDOWN Description: Skin to remain free from infection or breakdown with mod assist while on rehab. Outcome: Completed/Met Goal: RH STG MAINTAIN SKIN INTEGRITY WITH ASSISTANCE Description: STG Maintain Skin Integrity With mod Assistance. Outcome: Completed/Met   Problem: RH SAFETY Goal: RH STG ADHERE TO SAFETY PRECAUTIONS W/ASSISTANCE/DEVICE Description: STG Adhere to Safety Precautions With mod Assistance/Device. Outcome: Completed/Met Goal: RH STG DECREASED RISK OF FALL WITH ASSISTANCE Description: STG Decreased Risk of Fall With mod Assistance. Outcome: Completed/Met   Problem: RH COGNITION-NURSING Goal: RH STG USES MEMORY AIDS/STRATEGIES W/ASSIST TO PROBLEM SOLVE Description: STG Uses Memory Aids/Strategies With cue/reminders Assistance to Problem Solve. Outcome: Completed/Met Goal: RH STG ANTICIPATES NEEDS/CALLS FOR ASSIST W/ASSIST/CUES Description: STG  Anticipates Needs/Calls for Assist With mod Assistance/Cues. Outcome: Completed/Met   Problem: RH PAIN MANAGEMENT Goal: RH STG PAIN MANAGED AT OR BELOW PT'S PAIN GOAL Description: <3 on a 0-10 pain scale. Outcome: Completed/Met   Problem: RH KNOWLEDGE DEFICIT Goal: RH STG INCREASE KNOWLEDGE OF HYPERTENSION Description: Patient will demonstrate knowledge of HTN medications, dietary restrictions, BP parameters, and follow up care with the MD with min assist from [facility] staff. Outcome: Completed/Met Goal: RH STG INCREASE KNOWLEDGE OF DYSPHAGIA/FLUID INTAKE Description: Patient will demonstrate knowledge of dysphagia and fluid intake while following safe swallowing precautions with min assist from CIR staff. Outcome: Completed/Met Goal: RH STG INCREASE KNOWLEGDE OF HYPERLIPIDEMIA Description: Patient will demonstrate knowledge of HLD medications, dietary restrictions, and follow up care with the MD with min assist from CIR staff.  Outcome: Completed/Met Goal: RH STG INCREASE KNOWLEDGE OF STROKE PROPHYLAXIS Description: Patient will demonstrate knowledge of secondary stroke prophylaxis medications, diet restrictions, exercise plans, and follow up care with the MD with min assist from the [facility] staff. Outcome: Completed/Met

## 2020-05-29 NOTE — Progress Notes (Signed)
[facility] PHYSICAL MEDICINE & REHABILITATION PROGRESS NOTE   Subjective/Complaints: Discharging home today.   ROS: Denies pain, constipation, insomnia.  Objective:   No results found. No results for input(s): WBC, HGB, HCT, PLT in the last 72 hours. No results for input(s): NA, K, CL, CO2, GLUCOSE, BUN, CREATININE, CALCIUM in the last 72 hours.  Intake/Output Summary (Last 24 hours) at 05/29/2020 1023 Last data filed at 05/29/2020 0900 Gross per 24 hour  Intake 512 ml  Output --  Net 512 ml     Physical Exam: Vital Signs Blood pressure 109/66, pulse (!) 58, temperature 98.3 F (36.8 C), temperature source Oral, resp. rate 17, height 5\' 5"  (1.651 m), weight 73.1 kg, SpO2 100 %. General: Alert and oriented x 3, No apparent distress HEENT: Head is normocephalic, atraumatic, PERRLA, EOMI, sclera anicteric, oral mucosa pink and moist, dentition intact, ext ear canals clear,  Neck: Supple without JVD or lymphadenopathy Heart: Reg rate and rhythm. No murmurs rubs or gallops Chest: CTA bilaterally without wheezes, rales, or rhonchi; no distress Abdomen: Soft, non-tender, non-distended, bowel sounds positive. Extremities: No clubbing, cyanosis, or edema. Pulses are 2+ Skin: Clean and intact without signs of breakdown Musculoskeletal: Full range of motion in all 4 extremities. No joint swelling seen. Right forearms still a little sensitive.  Neuro: expressive aphasia, apraxia. Often tieing more words together.  Right sided hemiplegia tr/5 RUE  and 1+ to 2-/5 HE, KE,  0/5 distally RLE. Left side 5/5-- subtle flexor tone RUE still present  Assessment/Plan: 1. Functional deficits secondary to right hemiplegia which require 3+ hours per day of interdisciplinary therapy in a comprehensive inpatient rehab setting.  Physiatrist is providing close team supervision and 24 hour management of active medical problems listed below.  Physiatrist and rehab team continue to assess barriers to  discharge/monitor patient progress toward functional and medical goals  Care Tool:  Bathing    Body parts bathed by patient: Chest, Abdomen, Front perineal area, Left upper leg, Face, Right upper leg, Right lower leg, Right arm, Left lower leg, Buttocks   Body parts bathed by helper: Left arm     Bathing assist Assist Level: Minimal Assistance - Patient > 75%     Upper Body Dressing/Undressing Upper body dressing   What is the patient wearing?: Bra, Pull over shirt    Upper body assist Assist Level: Supervision/Verbal cueing    Lower Body Dressing/Undressing Lower body dressing      What is the patient wearing?: Incontinence brief, Pants     Lower body assist Assist for lower body dressing: Minimal Assistance - Patient > 75%     Toileting Toileting    Toileting assist Assist for toileting: Minimal Assistance - Patient > 75%     Transfers Chair/bed transfer  Transfers assist  Chair/bed transfer activity did not occur: Safety/medical concerns  Chair/bed transfer assist level: Supervision/Verbal cueing Chair/bed transfer assistive device: Sliding board   Locomotion Ambulation   Ambulation assist   Ambulation activity did not occur: Safety/medical concerns  Assist level: Minimal Assistance - Patient > 75% Assistive device: Walker-rolling (With R hand splint and R AFO and w/c follow for safety) Max distance: 70 ft   Walk 10 feet activity   Assist  Walk 10 feet activity did not occur: Safety/medical concerns  Assist level: Minimal Assistance - Patient > 75% Assistive device: Walker-rolling (With R hand splint and R AFO and w/c follow for safety)   Walk 50 feet activity   Assist Walk 50 feet with  2 turns activity did not occur: Safety/medical concerns (only ambulated over level surfaces in a straight line for safety and due to decreased motor planning at this time)         Walk 150 feet activity   Assist Walk 150 feet activity did not occur:  Safety/medical concerns (only ambulated over level surfaces in a straight line for safety and due to decreased motor planning at this time)         Walk 10 feet on uneven surface  activity   Assist Walk 10 feet on uneven surfaces activity did not occur: Safety/medical concerns (only ambulated over level surfaces in a straight line for safety and due to decreased motor planning at this time)         Wheelchair     Assist Will patient use wheelchair at discharge?: Yes Type of Wheelchair: Manual    Wheelchair assist level: Supervision/Verbal cueing Max wheelchair distance: 150 ft    Wheelchair 50 feet with 2 turns activity    Assist        Assist Level: Supervision/Verbal cueing   Wheelchair 150 feet activity     Assist      Assist Level: Supervision/Verbal cueing   Blood pressure 109/66, pulse (!) 58, temperature 98.3 F (36.8 C), temperature source Oral, resp. rate 17, height 5\' 5"  (1.651 m), weight 73.1 kg, SpO2 100 %.   Medical Problem List and Plan: 1.R hemiplegia and impaired function/expressive aphasia and R inattentionsecondary to L MCA stroke with hemorrhagic extension- with dysphagia- on D2 with thins             -patient may shower -ELOS/Goals: 8/27, goals Mod I to min A             -F/u with Dr. 9/27 in 1-2 weeks.  2. Antithrombotics: -DVT/anticoagulation: started eliquis on 8/11 per discussion with neurology -antiplatelet therapy: d/c'ed ASA (started eliquis)    3. Pain Management: will add low dose gabapentin 100mg  bid for RUE neuropathic pain             -tylenol prn. Well controlled.  4. Mood: LCSW to follow for evaluation and support.  -antipsychotic agents: N/a 5. Neuropsych: This patient is not fully capable of making decisions on her own behalf. 6. Skin/Wound Care: Routine pressure relief measures. IVs may be removed. 7. Fluids/Electrolytes/Nutrition: Monitor I/O. Na is 138 8/16              -prealbumin hovering around 16                         -continue remeron at HS for appetite             - po intake inconsistent but improving, family bringing food in too             8/24 dietary consult appreciated  8. A fib with RVR: Monitor HR bid--continue metoprolol 75 mg bid.              -HR 70's-90's             - eliquis               8/23: tachycardic to 104. May have been due to holding of lopressor. Lopressor reduced to 50mg  bid             8/24 bp/hr well controlled today. HR also needs to be recorded apically to record a accurate rate  8/26: BP  l controlled 9. HTN: Monitor BP tid. On Lopressor changed to 50mg  BID 8/23.   8/27: BP low- continue to monitor in outpatient setting.  10 Dysphagia: On Dysphagia 2, thin liquids. Encourage intake.             -family can bring in food from home with in dietary restrictions.  11. ETOH abuse: Has not had issues with withdrawal.  12. Prediabetes: Hgb A1c-6.1.              -cbg's well controlled. Dietary counseling outpatient.  13. ABLA: Hgb 11.1 8/16, down to 10.7 on 8/23.               -add fe+ supp             -normocytic, likely d/t poor nutritional status                 LOS: 29 days A FACE TO FACE EVALUATION WAS PERFORMED  9/23 P Nioka [place]ton 05/29/2020, 10:23 AM

## 2020-05-31 DIAGNOSIS — D649 Anemia, unspecified: Secondary | ICD-10-CM

## 2020-06-01 MED FILL — Digoxin Inj 0.25 MG/ML: INTRAMUSCULAR | Qty: 1 | Status: AC

## 2020-06-05 ENCOUNTER — Telehealth: Payer: Self-pay

## 2020-06-05 NOTE — Telephone Encounter (Signed)
Verbal okay given for home health visits for  OT. Once weekly for 3 weeks.

## 2020-06-11 ENCOUNTER — Encounter: Payer: Medicaid Other | Admitting: Registered Nurse

## 2020-06-15 ENCOUNTER — Encounter: Payer: Self-pay | Admitting: Registered Nurse

## 2020-06-15 ENCOUNTER — Encounter: Payer: Medicaid Other | Attending: Registered Nurse | Admitting: Registered Nurse

## 2020-06-15 ENCOUNTER — Other Ambulatory Visit: Payer: Self-pay

## 2020-06-15 VITALS — BP 115/75 | HR 65 | Temp 98.9°F

## 2020-06-15 DIAGNOSIS — I10 Essential (primary) hypertension: Secondary | ICD-10-CM

## 2020-06-15 DIAGNOSIS — I63512 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 middle cerebral artery: Secondary | ICD-10-CM

## 2020-06-15 DIAGNOSIS — G8191 Hemiplegia, unspecified affecting right dominant side: Secondary | ICD-10-CM

## 2020-06-15 NOTE — Progress Notes (Signed)
Subjective:    Patient ID: Lindsey [name], female    DOB: 11/19/1955, 64 y.o.   MRN: [id number]  HPI: Lindsey [name] is a 64 y.o. female who is here for Hospital Follow Up of Her Acute Ischemic Left MCA Stroke, Right Hemiplegia and Essential Hypertension. Lindsey [name] was brought to [facility] ED on 07/202/2021 with right sided weakness and slurred speech. Neurology Consulted.  CT Head WO Contrast:  IMPRESSION: 1. There is a large hypodensity of the anterior left MCA territory, findings consistent with acute to subacute left MCA territory infarction. ASPECTS 4.  2.  No evidence of hemorrhage.  3. There is mild mass effect on the left lateral ventricle with approximately 3 mm left right midline shift.  CT ANGIO Head/Neck:  IMPRESSION: 1. 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 Acute occlusion inferior division left M2  . 3. No other intracranial stenosis 4. No extracranial stenosis in the carotid or vertebral arteries. 5. These results were called by telephone at the time of interpretation on 04/21/2020 at 6:59 pm to provider JOSHUA LONG , who verbally acknowledged these results.  MRI Brain:  IMPRESSION: Acute infarct left MCA territory there is mild hemorrhage in the infarct which is not seen on recent CT earlier today.  Lindsey [name] was admitted to Inpatient Rehabilitation on 04/30/2020 and discharged home on 05/29/2020. She is receiving Home Health Therapy with [facility]. Lindsey [name] was ale to answer yes and no questions, her speech noted to be Language of confusion at times. This provider spoke with Lindsey [name] daughter [name], after assessment, she answered all provider questions and provider answered her questions. Lindsey [name] was unable to be in room with her mother Lindsey [name] due to being febrile, she was encouraged to F/U with her doctor as well, she verbalizes understanding.   Pain Inventory Average Pain 6 Pain Right Now 0 My pain is intermittent, sharp, stabbing,  tingling and aching  LOCATION OF PAIN    BOWEL Number of stools per week:  Oral laxative use No  Type of laxative  Enema or suppository use No  History of colostomy No  Incontinent No   BLADDER Normal In and out cath, frequency  Able to self cath No  Bladder incontinence Yes  Frequent urination No  Leakage with coughing No  Difficulty starting stream No  Incomplete bladder emptying No    Mobility ability to climb steps?  no do you drive?  no use a wheelchair needs help with transfers  Function disabled: date disabled 04/2020 I need assistance with the following:  dressing, bathing, toileting, meal prep, household duties and shopping  Neuro/Psych trouble walking confusion depression  Prior Studies HFU  Physicians involved in your care HFU   Family History  Problem Relation Age of Onset  . Breast cancer Neg Hx    Social History   Socioeconomic History  . Marital status: Single    Spouse name: Not on file  . Number of children: Not on file  . Years of education: Not on file  . Highest education level: Not on file  Occupational History  . Not on file  Tobacco Use  . Smoking status: Former Smoker    Packs/day: 0.25    Types: Cigarettes    Quit date: 10/27/1978    Years since quitting: 41.6  . Smokeless tobacco: Never Used  Substance and Sexual Activity  . Alcohol use: Yes    Alcohol/week: 0.0 standard drinks    Comment: states once a wine/beer  occassionally   . Drug use: No  . Sexual activity: Never  Other Topics Concern  . Not on file  Social History Narrative  . Not on file   Social Determinants of Health   Financial Resource Strain:   . Difficulty of Paying Living Expenses: Not on file  Food Insecurity:   . Worried About Programme researcher, broadcasting/film/video in the Last Year: Not on file  . Ran Out of Food in the Last Year: Not on file  Transportation Needs:   . Lack of Transportation (Medical): Not on file  . Lack of Transportation (Non-Medical): Not  on file  Physical Activity:   . Days of Exercise per Week: Not on file  . Minutes of Exercise per Session: Not on file  Stress:   . Feeling of Stress : Not on file  Social Connections:   . Frequency of Communication with Friends and Family: Not on file  . Frequency of Social Gatherings with Friends and Family: Not on file  . Attends Religious Services: Not on file  . Active Member of Clubs or Organizations: Not on file  . Attends Banker Meetings: Not on file  . Marital Status: Not on file   No past surgical history on file. Past Medical History:  Diagnosis Date  . Diverticulosis   . Hypertension   . Uterine prolapse    BP 115/75   Pulse (!) 59   Temp 98.9 F (37.2 C)   SpO2 95%   Opioid Risk Score:   Fall Risk Score:  `1  Depression screen PHQ 2/9  Depression screen PHQ 2/9 06/15/2020  Decreased Interest 1  Down, Depressed, Hopeless 1  PHQ - 2 Score 2  Altered sleeping 0  Tired, decreased energy 1  Change in appetite 0  Feeling bad or failure about yourself  1  Trouble concentrating 0  Moving slowly or fidgety/restless 3  Suicidal thoughts 0  PHQ-9 Score 7  Difficult doing work/chores Somewhat difficult   Review of Systems  Musculoskeletal: Positive for gait problem.  Psychiatric/Behavioral: Positive for confusion and dysphoric mood.  All other systems reviewed and are negative.      Objective:   Physical Exam Vitals and nursing note reviewed.  Constitutional:      Appearance: Normal appearance.     Comments: Alert and Oriented X2  Cardiovascular:     Rate and Rhythm: Normal rate and regular rhythm.     Pulses: Normal pulses.     Heart sounds: Normal heart sounds.  Pulmonary:     Effort: Pulmonary effort is normal.     Breath sounds: Normal breath sounds.  Musculoskeletal:     Cervical back: Normal range of motion and neck supple.     Comments: Normal Muscle Bulk and Muscle Testing Reveals:  Upper Extremities: Right Upper Extremity:  Paralysis Left Lower Extremity: Full ROM and Muscle Strength 5/5  Lower Extremities: Right Lower Extremity: Paralysis Left Lower Extremity: Full ROM and Muscle Strength 5/5 Arrived in wheelchair  Skin:    General: Skin is warm and dry.  Neurological:     Mental Status: She is alert.     Comments: Alert and Oriented x2  Psychiatric:        Mood and Affect: Mood normal.        Behavior: Behavior normal.           Assessment & Plan:  1. Acute Ischemic Left MCA Stroke:Right Hemiplegia: Continue Home Health Therapy with [facility]. She has  a HFU appointment with Neurology.  2. Essential Hypertension: Continue current medication regimen. PCP Following. Continue to Monitor.   30 minutes of face to face patient care time was spent during this visit. All questions were encouraged and answered.  F/U with Dr Carlis Abbott in 4-6 weeks

## 2020-06-19 ENCOUNTER — Telehealth: Payer: Self-pay | Admitting: *Deleted

## 2020-06-19 NOTE — Telephone Encounter (Signed)
Lindsey [name] PT with [facility] called to get ok for POC 1 wk2  And one missed visit this week due to sched conflict with her daughter.  Approval given.

## 2020-06-25 DIAGNOSIS — Z7901 Long term (current) use of anticoagulants: Secondary | ICD-10-CM

## 2020-06-25 DIAGNOSIS — Z9181 History of falling: Secondary | ICD-10-CM

## 2020-06-25 DIAGNOSIS — I69320 Aphasia following cerebral infarction: Secondary | ICD-10-CM | POA: Diagnosis not present

## 2020-06-25 DIAGNOSIS — N814 Uterovaginal prolapse, unspecified: Secondary | ICD-10-CM

## 2020-06-25 DIAGNOSIS — D649 Anemia, unspecified: Secondary | ICD-10-CM

## 2020-06-25 DIAGNOSIS — I69351 Hemiplegia and hemiparesis following cerebral infarction affecting right dominant side: Secondary | ICD-10-CM | POA: Diagnosis not present

## 2020-06-25 DIAGNOSIS — K5790 Diverticulosis of intestine, part unspecified, without perforation or abscess without bleeding: Secondary | ICD-10-CM

## 2020-06-25 DIAGNOSIS — Z87891 Personal history of nicotine dependence: Secondary | ICD-10-CM

## 2020-06-25 DIAGNOSIS — I4891 Unspecified atrial fibrillation: Secondary | ICD-10-CM

## 2020-06-25 DIAGNOSIS — I119 Hypertensive heart disease without heart failure: Secondary | ICD-10-CM

## 2020-06-25 DIAGNOSIS — I69391 Dysphagia following cerebral infarction: Secondary | ICD-10-CM | POA: Diagnosis not present

## 2020-06-25 DIAGNOSIS — R7303 Prediabetes: Secondary | ICD-10-CM

## 2020-06-25 DIAGNOSIS — G629 Polyneuropathy, unspecified: Secondary | ICD-10-CM

## 2020-06-25 DIAGNOSIS — I69322 Dysarthria following cerebral infarction: Secondary | ICD-10-CM

## 2020-06-25 DIAGNOSIS — I4892 Unspecified atrial flutter: Secondary | ICD-10-CM

## 2020-06-25 DIAGNOSIS — H539 Unspecified visual disturbance: Secondary | ICD-10-CM

## 2020-06-25 DIAGNOSIS — R1312 Dysphagia, oropharyngeal phase: Secondary | ICD-10-CM

## 2020-07-06 ENCOUNTER — Telehealth: Payer: Self-pay

## 2020-07-06 NOTE — Telephone Encounter (Signed)
Patient daughter is calling requesting an AFO brace for ankle for patient.

## 2020-07-07 NOTE — Telephone Encounter (Signed)
Faxed and daughter notified.

## 2020-07-07 NOTE — Telephone Encounter (Signed)
Script given to you, thank you!

## 2020-07-08 ENCOUNTER — Encounter: Payer: Self-pay | Admitting: Adult Health

## 2020-07-08 ENCOUNTER — Other Ambulatory Visit: Payer: Self-pay

## 2020-07-08 ENCOUNTER — Ambulatory Visit: Payer: Medicaid Other | Admitting: Adult Health

## 2020-07-08 VITALS — BP 136/88 | HR 82 | Ht 65.0 in | Wt 165.0 lb

## 2020-07-08 DIAGNOSIS — I63512 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 middle cerebral artery: Secondary | ICD-10-CM | POA: Diagnosis not present

## 2020-07-08 DIAGNOSIS — R7303 Prediabetes: Secondary | ICD-10-CM | POA: Diagnosis not present

## 2020-07-08 DIAGNOSIS — G8111 Spastic hemiplegia affecting right dominant side: Secondary | ICD-10-CM | POA: Diagnosis not present

## 2020-07-08 DIAGNOSIS — I10 Essential (primary) hypertension: Secondary | ICD-10-CM

## 2020-07-08 DIAGNOSIS — R4701 Aphasia: Secondary | ICD-10-CM

## 2020-07-08 DIAGNOSIS — E785 Hyperlipidemia, unspecified: Secondary | ICD-10-CM | POA: Diagnosis not present

## 2020-07-08 DIAGNOSIS — I4819 Other persistent atrial fibrillation: Secondary | ICD-10-CM

## 2020-07-08 NOTE — Patient Instructions (Addendum)
Referral will be placed for home health physical, occupation and speech therapy  Continue Eliquis (apixaban) daily  and atorvastatin for secondary stroke prevention  Continue to follow up with PCP regarding cholesterol and blood pressure management  Maintain strict control of hypertension with blood pressure goal below 130/90 and cholesterol with LDL cholesterol (bad cholesterol) goal below 70 mg/dL.      Followup in the future with me in 3 months or call earlier if needed       Thank you for coming to see [place] at [facility]. I hope we have been able to provide you high quality care today.  You may receive a patient satisfaction survey over the next few weeks. We would appreciate your feedback and comments so that we may continue to improve ourselves and the health of our patients.

## 2020-07-08 NOTE — Progress Notes (Signed)
[facility] [address] [place].  [address] [phone]       HOSPITAL FOLLOW UP NOTE  [name] Date of Birth:  Mar 09, 1956 Medical Record Number:  [id number]   Reason for Referral:  hospital stroke follow up    SUBJECTIVE:   CHIEF COMPLAINT:  Chief Complaint  Patient presents with  . Hospitalization Follow-up    HPI:   [name] is a 64 y.o. female with history of HTN who presented on 04/21/2020 with slurred speech and right-sided weakness.  Stroke work-up revealed left MCA infarct with hemorrhagic transformation, embolic most likely secondary to new onset A. fib.  CTA showed left M2 occlusion but unfortunately not surgical candidate due to late presentation.  No antithrombotic PTA and initiated aspirin with recommendation of initiating anticoagulation after 3 to 5 days due to hemorrhagic transformation for new onset A. Fib. HTN stable and resumed home meds amlodipine and atenolol.  LDL 127 initiate atorvastatin 40 mg daily.  Pre-DM with A1c 6.1.  Other stroke risk factors include former tobacco use, EtOH use, and substance use with positive UDS for THC.  Residual deficits of dysphagia, global aphasia and right hemiplegia.  Evaluated therapy and recommended discharge to CIR for ongoing therapy needs.  Discharged to CIR on 04/30/2020.  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 Mild L lateral ventricle mass effect w/ 46mm midline shift. ASPECTS 4    CTA head & neck L MCA infarct. L M2 occlusion.   MRI  L MCA infarct w/ mild hemorrhagic transformation   Repeat CT head evolving L MCA infarct w/ stable hemorrhage   2D Echo normal ejection fraction.  No clot.  LE doppler no DVT.    LDL 127  HgbA1c 6.1  VTE prophylaxis - added Lovenox 40 mg sq daily (ok in setting of hemorrhagic transformation)  No antithrombotic  prior to admission, now on No antithrombotic given hemorrhagic transformation. Ok for aspirin (not ok for [facility]). Will add.     Therapy recommendations:  CIR  Disposition:  CIR   Today, 07/08/2020, Lindsey [name] is being seen for hospital follow-up accompanied by her daughter who provides majority of history.  She was discharged home from CIR on 05/29/2020.  Residual deficits of right hemiplegia and expressive > receptive aphasia but has experienced ongoing improvement.  No residual dysphagia.  Able to stand with assistance but unable to ambulate at this time.  She did receive HH therapy for 2 weeks but placed on hold as she was awaiting for Medicaid approval.  Recently approved by Medicaid and daughter requesting possibility of restarting therapy sessions.  Denies new or worsening stroke/TIA symptoms. CT head repeated 8/9 during CIR admission and cleared to initiate Eliquis on 8/11.  She remains on Eliquis without bleeding or bruising.  Remains on atorvastatin 40 mg daily without myalgias.  Blood pressure today 136/88. Monitored at home by therapy which was stable.  No further concerns at this time.      ROS:   Unable to obtain due to aphasia  PMH:  Past Medical History:  Diagnosis Date  . Diverticulosis   . Hypertension   . Uterine prolapse     PSH: No past surgical history on file.  Social History:  Social History   Socioeconomic History  . Marital status: Single    Spouse name: Not on file  . Number of children:  Not on file  . Years of education: Not on file  . Highest education level: Not on file  Occupational History  . Not on file  Tobacco Use  . Smoking status: Former Smoker    Packs/day: 0.25    Types: Cigarettes    Quit date: 10/27/1978    Years since quitting: 41.7  . Smokeless tobacco: Never Used  Substance and Sexual Activity  . Alcohol use: Yes    Alcohol/week: 0.0 standard drinks    Comment: states once a wine/beer occassionally   . Drug use: No  . Sexual  activity: Never  Other Topics Concern  . Not on file  Social History Narrative  . Not on file   Social Determinants of Health   Financial Resource Strain:   . Difficulty of Paying Living Expenses: Not on file  Food Insecurity:   . Worried About Programme researcher, broadcasting/film/video in the Last Year: Not on file  . Ran Out of Food in the Last Year: Not on file  Transportation Needs:   . Lack of Transportation (Medical): Not on file  . Lack of Transportation (Non-Medical): Not on file  Physical Activity:   . Days of Exercise per Week: Not on file  . Minutes of Exercise per Session: Not on file  Stress:   . Feeling of Stress : Not on file  Social Connections:   . Frequency of Communication with Friends and Family: Not on file  . Frequency of Social Gatherings with Friends and Family: Not on file  . Attends Religious Services: Not on file  . Active Member of Clubs or Organizations: Not on file  . Attends Banker Meetings: Not on file  . Marital Status: Not on file  Intimate Partner Violence:   . Fear of Current or Ex-Partner: Not on file  . Emotionally Abused: Not on file  . Physically Abused: Not on file  . Sexually Abused: Not on file    Family History:  Family History  Problem Relation Age of Onset  . Breast cancer Neg Hx     Medications:   Current Outpatient Medications on File Prior to Visit  Medication Sig Dispense Refill  . acetaminophen (TYLENOL) 325 MG tablet Take 1-2 tablets (325-650 mg total) by mouth every 4 (four) hours as needed for mild pain.    Marland Kitchen amLODipine (NORVASC) 10 MG tablet Take 1 tablet (10 mg total) by mouth daily. 30 tablet 0  . apixaban (ELIQUIS) 5 MG TABS tablet Take 1 tablet (5 mg total) by mouth 2 (two) times daily. 60 tablet 0  . atorvastatin (LIPITOR) 40 MG tablet Take 1 tablet (40 mg total) by mouth daily at 6 PM. 30 tablet 0  . diclofenac Sodium (VOLTAREN) 1 % GEL Apply 2 g topically 3 (three) times daily. 350 g 0  . gabapentin (NEURONTIN) 100  MG capsule Take 1 capsule (100 mg total) by mouth 2 (two) times daily. 60 capsule 0  . metoprolol tartrate (LOPRESSOR) 50 MG tablet Take 1 tablet (50 mg total) by mouth 2 (two) times daily. 60 tablet 0  . mirtazapine (REMERON SOL-TAB) 15 MG disintegrating tablet Take 1 tablet (15 mg total) by mouth at bedtime. 30 tablet 0  . Mouthwashes (MOUTH RINSE) LIQD solution 15 mLs by Mouth Rinse route 2 times daily at 12 noon and 4 pm. 473 mL 0   No current facility-administered medications on file prior to visit.    Allergies:  No Known Allergies    OBJECTIVE:  Physical Exam  Vitals:   07/08/20 0912  BP: 136/88  Pulse: 82  Weight: 165 lb (74.8 kg)  Height: 5\' 5"  (1.651 m)   Body mass index is 27.46 kg/m. No exam data present  General: well developed, well nourished,  pleasant middle-age African-American female, seated, in no evident distress Head: head normocephalic and atraumatic.   Neck: supple with no carotid or supraclavicular bruits Cardiovascular: irregular rate and rhythm, no murmurs Musculoskeletal: no deformity Skin:  no rash/petichiae Vascular:  Normal pulses all extremities   Neurologic Exam Mental Status: Awake and fully alert.   Moderate to severe expressive> mild to moderate receptive aphasia.  Mild difficulty following simple step commands.  Able to repeat simple words.  Difficulty fully assessing orientation due to global aphasia.  Able to state her name and date of birth.  Majority of history provided by daughter.  Mood and affect appropriate.  Cranial Nerves: Fundoscopic exam reveals sharp disc margins. Pupils equal, briskly reactive to light. Extraocular movements full without nystagmus. Visual fields full to confrontation. Hearing intact. Facial sensation intact.  Right lower facial weakness.  Tongue, palate moves normally and symmetrically.  Motor: Full strength left upper and lower extremity.  RUE: 1/5; RLE: 2/5 hip flexor, 4/5 knee extension and flexion and 1/5  ankle dorsilfexion and planter flexion with increased tone throughout Sensory.: intact to touch , pinprick , position and vibratory sensation.  Coordination: Rapid alternating movements normal in all extremities on left side. Finger-to-nose and heel-to-shin performed accurately on left side. Gait and Station: Deferred as patient nonambulatory Reflexes: 2+ RUE and RLE: 1+ LUE and LLE. Toes downgoing.     NIHSS 10 1a.  Level of consciousness 0 1b. LOC questions 1 1c. LOC commands 1 2.  Best gaze 0 3.  Visual 0 4.  Facial palsy 1 5a.  Motor arm-left 0 5b.  Motor arm-right 3 6a.  Motor leg-left 0 6b.  Motor leg-right 2 7.  Limb ataxia 0 8.  Sensory 0 9.  Best language 2 10.  Dysarthria 0 11.  Extinction and inattention 0 Modified Rankin  4      ASSESSMENT: Lindsey [name] is a 64 y.o. year old female presented with right-sided weakness and slurred speech on 04/21/2020 found to have dysarthria, aphasia, left gaze preference and right hemiparesis and noted to be in A. fib with RVR with stroke work-up revealing left MCA infarct with hemorrhagic transformation secondary to new onset A. fib. Vascular risk factors include new onset A. fib, HTN, HLD, pre-DM, history of tobacco use and THC use.      PLAN:  1. Left MCA stroke:  a. Residual deficit: Right spastic hemiparesis, global aphasia and right facial weakness.  Referral placed for home health PT/OT/SLP.  Once completed, may need to transition to outpatient therapy for ongoing therapy.   b. Continue Eliquis (apixaban) daily  and atorvastatin for secondary stroke prevention.  c. Close PCP follow up for aggressive stroke risk factor management  2. Atrial fibrillation, new dx:  a. CHA2DS2-VASc score 5 b. Continue Eliquis per cardiology 3. HTN: BP goal <130/90.  Stable. On amlodipine and metoprolol per PCP 4. HLD: LDL goal <70. Recent LDL 127.  On atorvastatin 40 mg daily managed and prescribed by PCP 5. 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 Recent  A1c 6.1.  Monitored by PCP    Follow up in 3 months or call earlier if needed   I spent 45 minutes of face-to-face and non-face-to-face time with patient and daughter.  This included previsit  chart review, lab review, study review, order entry, electronic health record documentation, patient education regarding recent stroke, residual deficits, importance of managing stroke risk factors and answered all questions to patient satisfaction   Ihor AustinJessica McCue, [facility] LLCGNP-BC  [facility] [address] [place], [place] [id number]  Phone [phone] Fax [phone] Note: This document was prepared with digital dictation and possible smart phrase technology. Any transcriptional errors that result from this process are unintentional.

## 2020-07-09 NOTE — Progress Notes (Signed)
I agree with the above plan 

## 2020-07-27 ENCOUNTER — Encounter: Payer: Self-pay | Admitting: Physical Medicine and Rehabilitation

## 2020-07-27 ENCOUNTER — Encounter: Payer: Medicaid Other | Attending: Registered Nurse | Admitting: Physical Medicine and Rehabilitation

## 2020-07-27 ENCOUNTER — Other Ambulatory Visit: Payer: Self-pay

## 2020-07-27 VITALS — BP 100/69 | HR 79 | Temp 98.2°F

## 2020-07-27 DIAGNOSIS — G8191 Hemiplegia, unspecified affecting right dominant side: Secondary | ICD-10-CM

## 2020-07-27 DIAGNOSIS — I63512 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 middle cerebral artery: Secondary | ICD-10-CM | POA: Diagnosis not present

## 2020-07-27 DIAGNOSIS — R4701 Aphasia: Secondary | ICD-10-CM | POA: Diagnosis not present

## 2020-07-27 NOTE — Progress Notes (Signed)
Subjective:    Patient ID: Lindsey [name], female    DOB: October 11, 1955, 64 y.o.   MRN: [id number]  HPI: Lindsey [name] is a 64 y.o. female who is here for Hospital Follow Up of Her Acute Ischemic Left MCA Stroke, Right Hemiplegia and Essential Hypertension. Lindsey [name] was brought to [facility] ED on 07/202/2021 with right sided weakness and slurred speech. Neurology Consulted.   Lindsey [name] says things have been going well at home.   Moving bowels regularly.   Her BP is low in office to 100/69.   She does not need any refills this visit.   Home therapy has ended because she has no insurance coverage past 2 weeks.  Average pain is 6/10. She notes pain in her right hand. She puts the Voltaren gel on it and that helps.   CT Head WO Contrast:  IMPRESSION: 1. There is a large hypodensity of the anterior left MCA territory, findings consistent with acute to subacute left MCA territory infarction. ASPECTS 4.  2.  No evidence of hemorrhage.  3. There is mild mass effect on the left lateral ventricle with approximately 3 mm left right midline shift.  CT ANGIO Head/Neck:  IMPRESSION: 1. 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 Acute occlusion inferior division left M2  . 3. No other intracranial stenosis 4. No extracranial stenosis in the carotid or vertebral arteries. 5. These results were called by telephone at the time of interpretation on 04/21/2020 at 6:59 pm to provider JOSHUA LONG , who verbally acknowledged these results.  MRI Brain:  IMPRESSION: Acute infarct left MCA territory there is mild hemorrhage in the infarct which is not seen on recent CT earlier today.  Lindsey [name] was admitted to Inpatient Rehabilitation on 04/30/2020 and discharged home on 05/29/2020. She is receiving Home Health Therapy with [facility]. Lindsey [name] was ale to answer yes and no questions, her speech noted to be Language of confusion at times. This provider spoke with Lindsey [name] daughter  Lindsey [name], after assessment, she answered all provider questions and provider answered her questions. LindseyOrozco was unable to be in room with her mother Lindsey [name] due to being febrile, she was encouraged to F/U with her doctor as well, she verbalizes understanding.   Pain Inventory Average Pain 6 Pain Right Now 0 My pain is intermittent, sharp, stabbing, tingling and aching  LOCATION OF PAIN    BOWEL Number of stools per week:  Oral laxative use No  Type of laxative  Enema or suppository use No  History of colostomy No  Incontinent No   BLADDER Normal In and out cath, frequency  Able to self cath No  Bladder incontinence Yes  Frequent urination No  Leakage with coughing No  Difficulty starting stream No  Incomplete bladder emptying No    Mobility ability to climb steps?  no do you drive?  no use a wheelchair needs help with transfers  Function disabled: date disabled 04/2020 I need assistance with the following:  dressing, bathing, toileting, meal prep, household duties and shopping  Neuro/Psych trouble walking confusion depression  Prior Studies Any changes since last visit?  no  Physicians involved in your care Any changes since last visit?  no   Family History  Problem Relation Age of Onset  . Breast cancer Neg Hx    Social History   Socioeconomic History  . Marital status: Single    Spouse name: Not on file  . Number of children: Not on file  .  Years of education: Not on file  . Highest education level: Not on file  Occupational History  . Not on file  Tobacco Use  . Smoking status: Former Smoker    Packs/day: 0.25    Types: Cigarettes    Quit date: 10/27/1978    Years since quitting: 41.7  . Smokeless tobacco: Never Used  Substance and Sexual Activity  . Alcohol use: Yes    Alcohol/week: 0.0 standard drinks    Comment: states once a wine/beer occassionally   . Drug use: No  . Sexual activity: Never  Other Topics Concern  . Not on file    Social History Narrative  . Not on file   Social Determinants of Health   Financial Resource Strain:   . Difficulty of Paying Living Expenses: Not on file  Food Insecurity:   . Worried About Programme researcher, broadcasting/film/video in the Last Year: Not on file  . Ran Out of Food in the Last Year: Not on file  Transportation Needs:   . Lack of Transportation (Medical): Not on file  . Lack of Transportation (Non-Medical): Not on file  Physical Activity:   . Days of Exercise per Week: Not on file  . Minutes of Exercise per Session: Not on file  Stress:   . Feeling of Stress : Not on file  Social Connections:   . Frequency of Communication with Friends and Family: Not on file  . Frequency of Social Gatherings with Friends and Family: Not on file  . Attends Religious Services: Not on file  . Active Member of Clubs or Organizations: Not on file  . Attends Banker Meetings: Not on file  . Marital Status: Not on file   No past surgical history on file. Past Medical History:  Diagnosis Date  . Diverticulosis   . Hypertension   . Uterine prolapse    There were no vitals taken for this visit.  Opioid Risk Score:   Fall Risk Score:  `1  Depression screen PHQ 2/9  Depression screen PHQ 2/9 06/15/2020  Decreased Interest 1  Down, Depressed, Hopeless 1  PHQ - 2 Score 2  Altered sleeping 0  Tired, decreased energy 1  Change in appetite 0  Feeling bad or failure about yourself  1  Trouble concentrating 0  Moving slowly or fidgety/restless 3  Suicidal thoughts 0  PHQ-9 Score 7  Difficult doing work/chores Somewhat difficult   Review of Systems  Musculoskeletal: Positive for gait problem.  Psychiatric/Behavioral: Positive for confusion and dysphoric mood.  All other systems reviewed and are negative.      Objective:   General: Alert and Oriented X2 , No apparent distress HEENT: Head is normocephalic, atraumatic, PERRLA, EOMI, sclera anicteric, oral mucosa pink and moist,  dentition intact, ext ear canals clear,  Neck: Supple without JVD or lymphadenopathy Heart: Reg rate and rhythm. No murmurs rubs or gallops Chest: CTA bilaterally without wheezes, rales, or rhonchi; no distress Abdomen: Soft, non-tender, non-distended, bowel sounds positive. Extremities: No clubbing, cyanosis, or edema. Pulses are 2+ Skin: Clean and intact without signs of breakdown Neuro: Arrived in wheelchair  Musculoskeletal: Normal Muscle Bulk and Muscle Testing Reveals:  Upper Extremities: Right Upper Extremity: Paralysis Left Lower Extremity: Full ROM and Muscle Strength 5/5  Lower Extremities: Right Lower Extremity: Paralysis Left Lower Extremity: Full ROM and Muscle Strength 5/5 Psych: Pt's affect is appropriate. Pt is cooperative     Assessment & Plan:  1. Acute Ischemic Left MCA Stroke:Right Hemiplegia: Provided  scripts for outpatient OT, PT, and SLP. She has a HFU appointment with Neurology.   2. Essential Hypertension: BP is currently soft. Medications reviewed and she is taking Amlodipine 10mg . Advised patient and daughter that they shoulder check BP daily and bring to follow-up appointment in 1 month as we may be able to d/c amlodipine. Currently without signs of leg swelling. Continue to Monitor.  3. Prevention of pressure injury: recommended offloading q2H  4. Appetite: improved. Encouraged eating nutritious foods and discussed recommendations.   All questions were encouraged and answered.  F/U in 1 month.

## 2020-07-31 ENCOUNTER — Ambulatory Visit: Payer: Medicaid Other | Attending: Physical Medicine and Rehabilitation

## 2020-07-31 ENCOUNTER — Other Ambulatory Visit: Payer: Self-pay

## 2020-07-31 DIAGNOSIS — R2689 Other abnormalities of gait and mobility: Secondary | ICD-10-CM | POA: Diagnosis present

## 2020-07-31 DIAGNOSIS — R262 Difficulty in walking, not elsewhere classified: Secondary | ICD-10-CM | POA: Diagnosis present

## 2020-07-31 DIAGNOSIS — I69351 Hemiplegia and hemiparesis following cerebral infarction affecting right dominant side: Secondary | ICD-10-CM

## 2020-07-31 DIAGNOSIS — M6281 Muscle weakness (generalized): Secondary | ICD-10-CM | POA: Diagnosis present

## 2020-07-31 NOTE — Therapy (Signed)
[facility] [address] [place], [place], [address] Phone: [phone]   Fax:  [phone]  Physical Therapy Evaluation  Patient Details  Name: Lindsey [name] MRN: [id number] Date of Birth: 1956/06/23 Referring Provider (PT): Horton Chin, MD  Managed medicaid CPT codes: [phone]- Therapeutic Exercise, [phone]- Neuro Re-education, [phone] - Gait Training, [phone] - Manual Therapy, [phone] - Therapeutic Activities, [phone] - Self Care, [phone] - Electrical stimulation (Manual) and [phone] - Orthotic Fit   Encounter Date: 07/31/2020   PT End of Session - 07/31/20 1024    Visit Number 1    Number of Visits 8    Date for PT Re-Evaluation 09/18/20   POC for 7 weeks   Authorization Type [employer] Medicaid (VL: 27 between OT/PT/ST)    PT Start Time 0849    PT Stop Time 0931    PT Time Calculation (min) 42 min    Equipment Utilized During Treatment Gait belt    Activity Tolerance Patient tolerated treatment well    Behavior During Therapy WFL for tasks assessed/performed           Past Medical History:  Diagnosis Date   Diverticulosis    Hypertension    Uterine prolapse     History reviewed. No pertinent surgical history.  There were no vitals filed for this visit.    Subjective Assessment - 07/31/20 0852    Subjective Patient presented to hospital on 04/21/20 with slurred speech and R sided weakness. 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 fib. Residual Deficits of right hemiplegia, and expressive > receptive aphasia. Discharged from Inpatient Rehab 05/29/20. Recieved Home Health Services for two weeks, but stopped having home health 3 weeks ago. Patient is currently using w/c for household and community mobility. Patient reports not ambulating at home. Daughter reports completing stand pivot transfers at home. No falls since patient has been at home. Daughter reports she does  have occasional pain in the R Wrist.    Patient is accompained by: Family member   Daughter   Pertinent History HTN, Alcohol Abuse    Limitations Standing;Walking    Patient Stated Goals Be able to walk    Currently in Pain? No/denies              [facility] PT Assessment - 07/31/20 0001      Assessment   Medical Diagnosis Acute L MCA CVA    Referring Provider (PT) Horton Chin, MD    Onset Date/Surgical Date 04/21/20   date of CVA   Hand Dominance Right    Prior Therapy CIR      Precautions   Precautions Fall    Required Braces or Orthoses Other Brace/Splint    Other Brace/Splint Daughter reports that patient is in process of getting AFO, will be ready on Nov. 10th.       Balance Screen   Has the patient fallen in the past 6 months Yes    How many times? 1    Has the patient had a decrease in activity level because of a fear of falling?  No    Is the patient reluctant to leave their home because of a fear of falling?  No      Home Environment   Living Environment Private residence    Living Arrangements Children    Available Help at Discharge Family    Type of Home House    Home Access Other (comment);Ramped entrance  ramp to get over [employer]old    Home Layout Two level;Able to live on main level with bedroom/bathroom    Home Equipment Wheelchair - manual;Hospital bed;Bedside commode;Shower seat;Grab bars - tub/shower    Additional Comments patient is residing on main level of daughters home      Prior Function   Level of Independence Independent    Vocation Part time employment    [employer]er[employer]ocation Requirements Worked weekends at [employer]      Cognition   Overall Cognitive Status Impaired/Different from baseline   per daugher reports   Memory Impaired    Memory Impairment Decreased long term memory;Decreased short term memory   per daughter report     Observation/Other Assessments   Observations Patient is aphasic, daughter providing majority of subjective information.      Skin Integrity No skin integrity/wounds reported by daughter      Sensation   Light Touch Impaired Detail    Additional Comments decreased sensation on RLE, but difficult to fully assess in comparsion to LLE due to aphasia      Tone   Assessment Location Right Lower Extremity      ROM / Strength   AROM / PROM / Strength AROM;Strength      AROM   Overall AROM  Deficits    Overall AROM Comments deficits due to strength deficits      Strength   Overall Strength Deficits    Strength Assessment Site Hip;Knee;Ankle    Right/Left Hip Right    Right Hip Flexion 3-/5    Right Hip ABduction 2-/5    Right Hip ADduction 2-/5    Right/Left Knee Right    Right Knee Flexion 3-/5    Right Knee Extension 3-/5    Right/Left Ankle Right    Right Ankle Dorsiflexion 2-/5      Bed Mobility   Bed Mobility Rolling Right;Rolling Left;Supine to Sit;Sit to Supine    Rolling Right Moderate Assistance - Patient 50-74%    Rolling Left Moderate Assistance - Patient 50-74%    Supine to Sit Moderate Assistance - Patient 50-74%    Sit to Supine Moderate Assistance - Patient 50-74%      Transfers   Transfers Sit to Stand;Stand to Sit    Sit to Stand 4: Min assist;3: Mod assist    Sit to Stand Details Tactile cues for weight shifting;Tactile cues for placement;Verbal cues for sequencing;Verbal cues for safe use of DME/AE;Verbal cues for technique;Manual facilitation for weight shifting    Stand to Sit 4: Min assist    Stand to Sit Details (indicate cue type and reason) Manual facilitation for placement;Verbal cues for sequencing    Comments compelted sit <> stand from w/c with RW x 2 reps. Patient demo recurvatum of RLE with stance position.       RLE Tone   RLE Tone Hypertonic;Modified Ashworth      RLE Tone   Modified Ashworth Scale for Grading Hypertonia RLE More marked increase in muscle tone through most of the ROM, but affected part(s) easily moved               Objective measurements  completed on examination: See above findings.          PT Education - 07/31/20 1023    Education Details Educated on [employer]/Evalution Findings. [facility]ated on [facility] for [facility] due to VL    Person(s) Educated Patient    Methods Explanation    Comprehension Verbalized understanding  PT Short Term Goals - 07/31/20 1036      PT SHORT TERM GOAL #1   Title Patient will be independent with HEP for strengthening with caregiver assistance (ALL STGs Due:    Baseline no HEP established    Time 3    Period Weeks    Status New    Target Date 08/21/20      PT SHORT TERM GOAL #2   Title Patient will demo ability to complete sit <> stand with Min A and RW to demonstrate improved functional mobility    Baseline Mod A    Time 3    Period Weeks    Status New      PT SHORT TERM GOAL #3   Title Patient will demo ability to complete stand pivot transfer with RW from w/c <> mat with Min A to demonstrate improved functional mobility    Baseline TBA    Time 3    Period Weeks    Status New             PT Long Term Goals - 07/31/20 1038      PT LONG TERM GOAL #1   Title patient will be independent with final HEP with caregiver assistance (All LTGS Due: 09/18/20)    Baseline no HEP established    Time 7    Period Weeks    Status New    Target Date 09/18/20      PT LONG TERM GOAL #2   Title Patient will demo ability to complete sit <> stand with CGA and LRAD to demonstrate improved functional mobility    Baseline Mod A    Time 7    Period Weeks    Status New      PT LONG TERM GOAL #3   Title Patient wll demo ability to complete all bed mobility with CGA to demonstrate improved funcitonal mobility and reduced caregiver assistance    Baseline TBA    Time 7    Period Weeks    Status New      PT LONG TERM GOAL #4   Title Patient will demo ability to ambulate > 20 ft with LRAD and R AFO donned with Mod A for improved household mobility    Baseline TBA    Time 7     Period Weeks    Status New                  Plan - 07/31/20 1033    Clinical Impression Statement Patient is a 65 y.o. female that was referred to Neuro OPPT services for L MCA CVA with residual aphasia and R Side Hemiplegia. Patient's PMH is significant for the following: HTN, Alcohol Abuse, Diverticulosis. CVA occured on 04/21/20, and patient recieved Inpatient Rehab as well as Home Health Therapy. Upon evaluation patient presents with the following impairments: decreased sensation, decreased functional mobility, decreased strength, impaired tone, decreased balance, and abnormal gait/functional mobility. Patient currently requiring Min - Mod A at this time for completion of functional mobility. Patient is not ambulating currently, and is using w/c as primary means of mobility. Patient will benefit from skilled PT services to progress toward all goals and maximize functional mobility.    Personal Factors and Comorbidities Comorbidity 2    Comorbidities HTN, Alcohol Abuse, Diverticulosis    Examination-Activity Limitations Bed Mobility;Bathing;Sit;Transfers;Dressing;Stairs;Stand;Locomotion Level;Toileting    Examination-Participation Restrictions Occupation;Community Activity;Driving    Stability/Clinical Decision Making Evolving/Moderate complexity    Clinical Decision Making Moderate  Rehab Potential Good    PT Frequency 1x / week    PT Duration --   7 weeks   PT Treatment/Interventions ADLs/Self Care Home Management;Electrical Stimulation;DME Instruction;Gait training;Stair training;Therapeutic activities;Functional mobility training;Therapeutic exercise;Balance training;Neuromuscular re-education;Patient/family education;Orthotic Fit/Training;Wheelchair mobility training;Manual techniques;Passive range of motion    PT Next Visit Plan Work on stand pivot transfer; try supine exercises for RLE; provide daughter with hope clinic information    Recommended Other Services Occupational and  Speech Therapy    Consulted and Agree with Plan of Care Patient;Family member/caregiver    Family Member Consulted Daughter           Patient will benefit from skilled therapeutic intervention in order to improve the following deficits and impairments:  Abnormal gait, Decreased balance, Decreased mobility, Decreased endurance, Difficulty walking, Impaired sensation, Pain, Impaired UE functional use, Decreased strength, Decreased safety awareness, Decreased knowledge of use of DME, Decreased activity tolerance, Decreased range of motion, Impaired tone, Improper body mechanics  Visit Diagnosis: Hemiplegia and hemiparesis following cerebral infarction affecting right dominant side (HCC)  Muscle weakness (generalized)  Difficulty in walking, not elsewhere classified  Other abnormalities of gait and mobility     Problem List Patient Active Problem List   Diagnosis Date Noted   Anemia 05/31/2020   Acute ischemic left MCA stroke (HCC) 04/30/2020   Right hemiplegia (HCC) 04/30/2020   CVA (cerebral vascular accident) (HCC) 04/21/2020   Unspecified atrial fibrillation (HCC) 04/21/2020   Essential hypertension 04/21/2020   Alcohol abuse 04/21/2020    Tempie Donning, PT, DPT 07/31/2020, 10:47 AM  [facility] [address] [place], [place], [address] Phone: [phone]   Fax:  [phone]  Name: Lindsey [name] MRN: [id number] Date of Birth: 1956-01-20

## 2020-08-07 ENCOUNTER — Ambulatory Visit: Payer: Medicaid Other

## 2020-08-14 ENCOUNTER — Ambulatory Visit: Payer: Medicaid Other | Attending: Physical Medicine and Rehabilitation

## 2020-08-14 ENCOUNTER — Other Ambulatory Visit: Payer: Self-pay

## 2020-08-14 DIAGNOSIS — I69351 Hemiplegia and hemiparesis following cerebral infarction affecting right dominant side: Secondary | ICD-10-CM | POA: Insufficient documentation

## 2020-08-14 DIAGNOSIS — R2689 Other abnormalities of gait and mobility: Secondary | ICD-10-CM | POA: Insufficient documentation

## 2020-08-14 DIAGNOSIS — R262 Difficulty in walking, not elsewhere classified: Secondary | ICD-10-CM | POA: Diagnosis present

## 2020-08-14 DIAGNOSIS — R4701 Aphasia: Secondary | ICD-10-CM | POA: Diagnosis present

## 2020-08-14 DIAGNOSIS — M6281 Muscle weakness (generalized): Secondary | ICD-10-CM | POA: Diagnosis present

## 2020-08-14 NOTE — Therapy (Signed)
[facility] [address] [place], [place], [address] Phone: [phone]   Fax:  [phone]  Physical Therapy Treatment  Patient Details  Name: Lindsey [name] MRN: [id number] Date of Birth: 1956/09/07 Referring Provider (PT): Horton Chin, MD   Encounter Date: 08/14/2020   PT End of Session - 08/14/20 1150    Visit Number 2    Number of Visits 8    Date for PT Re-Evaluation 09/18/20   POC for 7 weeks   Authorization Type [employer] Medicaid (VL: 27 between OT/PT/ST)    PT Start Time 1147    PT Stop Time 1232    PT Time Calculation (min) 45 min    Equipment Utilized During Treatment Gait belt    Activity Tolerance Patient tolerated treatment well    Behavior During Therapy WFL for tasks assessed/performed           Past Medical History:  Diagnosis Date  . Diverticulosis   . Hypertension   . Uterine prolapse     History reviewed. No pertinent surgical history.  There were no vitals filed for this visit.   Subjective Assessment - 08/14/20 1151    Subjective Patient reports no new changes since last visit. No falls/no pain. Recieved AFO from [employer].    Patient is accompained by: Family member   Daughter   Pertinent History HTN, Alcohol Abuse    Limitations Standing;Walking    Patient Stated Goals Be able to walk    Currently in Pain? No/denies              [facility] Adult PT Treatment/Exercise - 08/14/20 0001      Transfers   Transfers Sit to Stand;Stand to Sit    Sit to Stand 4: Min assist    Sit to Stand Details Tactile cues for weight shifting;Tactile cues for placement;Verbal cues for sequencing;Verbal cues for safe use of DME/AE;Verbal cues for technique;Manual facilitation for weight shifting    Stand to Sit 4: Min assist    Stand to Sit Details (indicate cue type and reason) Manual facilitation for placement;Verbal cues for sequencing    Comments completed sit <> stand from w/c with RW placed in  front of patient, completed x 6 reps. Patient require Min A for balance, as well as tactile/verbal cues for positioning of RLE prior to standing, as well as cues to reach back with LLE to w/c prior to descent. Educated daughter on how to guard for completion of sit <> stand at home to promote mobility.       Ambulation/Gait   Ambulation/Gait Yes    Ambulation/Gait Assistance 4: Min assist    Ambulation/Gait Assistance Details PT donned patient's personal R AFO to RLE prior to ambulation. Completed ambulation with RW and R hand grip attachment, patient requiring intermittent Min A for balance and advancement of RLE. Patient ambulating x 46 ft, following by x 76 ft with intermittent rest breaks between. PT providing manual faciliation for improved weight shift to RLE.     Ambulation Distance (Feet) 46 Feet   x 1, 76 x 1   Assistive device Rolling walker   R AFO, R Hand Grip Attachment   Gait Pattern Step-to pattern;Decreased arm swing - right;Decreased step length - left;Decreased arm swing - left;Decreased step length - right;Decreased stance time - right    Ambulation Surface Level;Indoor      Self-Care   Self-Care Other Self-Care Comments    Other Self-Care Comments  PT educating on ensure  skin checks with AFO to ensure patient has no skin breakdown. PT educating to remove AFO one patient is home after session to assess skin.       Neuro Re-ed    Neuro Re-ed Details  At sink completed standing activity including initially standing with single UE support from LUE x 3-4 minutes, then progressed to completed standing without UE support x 45 sec to further promote balance. Completed lateral weight shifting to R/L 2 x 10 reps to promote improved weight acceptance on RLE. Educated daughter on standing activity at sink at home to promote improved standing tolerance and strength.                   PT Education - 08/14/20 1239    Education Details Educated on Sit <> Stands and Standing at  [employer] with [employer]) Educated Patient    Methods Explanation    Comprehension Verbalized understanding            PT Short Term Goals - 07/31/20 1036      PT SHORT TERM GOAL #1   Title Patient will be independent with HEP for strengthening with caregiver assistance (ALL STGs Due:    Baseline no HEP established    Time 3    Period Weeks    Status New    Target Date 08/21/20      PT SHORT TERM GOAL #2   Title Patient will demo ability to complete sit <> stand with Min A and RW to demonstrate improved functional mobility    Baseline Mod A    Time 3    Period Weeks    Status New      PT SHORT TERM GOAL #3   Title Patient will demo ability to complete stand pivot transfer with RW from w/c <> mat with Min A to demonstrate improved functional mobility    Baseline TBA    Time 3    Period Weeks    Status New             PT Long Term Goals - 07/31/20 1038      PT LONG TERM GOAL #1   Title patient will be independent with final HEP with caregiver assistance (All LTGS Due: 09/18/20)    Baseline no HEP established    Time 7    Period Weeks    Status New    Target Date 09/18/20      PT LONG TERM GOAL #2   Title Patient will demo ability to complete sit <> stand with CGA and LRAD to demonstrate improved functional mobility    Baseline Mod A    Time 7    Period Weeks    Status New      PT LONG TERM GOAL #3   Title Patient wll demo ability to complete all bed mobility with CGA to demonstrate improved funcitonal mobility and reduced caregiver assistance    Baseline TBA    Time 7    Period Weeks    Status New      PT LONG TERM GOAL #4   Title Patient will demo ability to ambulate > 20 ft with LRAD and R AFO donned with Mod A for improved household mobility    Baseline TBA    Time 7    Period Weeks    Status New                 Plan - 08/14/20 1249  Clinical Impression Statement Patient recieved R personal AFO, PT donned and  completed ambulation during session with RW and R hand grip attachment, patient requiring Min A at this time for advancement of RLE and balance. Patient ambulating x 76 ft today. Completed sit <> stand training, as well as standing tolerance activity at sink to further promote weight bearing and mobility. will continue to progress toward all goals.    Personal Factors and Comorbidities Comorbidity 2    Comorbidities HTN, Alcohol Abuse, Diverticulosis    Examination-Activity Limitations Bed Mobility;Bathing;Sit;Transfers;Dressing;Stairs;Stand;Locomotion Level;Toileting    Examination-Participation Restrictions Occupation;Community Activity;Driving    Stability/Clinical Decision Making Evolving/Moderate complexity    Rehab Potential Good    PT Frequency 1x / week    PT Duration --   7 weeks   PT Treatment/Interventions ADLs/Self Care Home Management;Electrical Stimulation;DME Instruction;Gait training;Stair training;Therapeutic activities;Functional mobility training;Therapeutic exercise;Balance training;Neuromuscular re-education;Patient/family education;Orthotic Fit/Training;Wheelchair mobility training;Manual techniques;Passive range of motion    PT Next Visit Plan continue gait trianing; try supine exercises for RLE; provide daughter with hope clinic information    Consulted and Agree with Plan of Care Patient;Family member/caregiver    Family Member Consulted Daughter           Patient will benefit from skilled therapeutic intervention in order to improve the following deficits and impairments:  Abnormal gait, Decreased balance, Decreased mobility, Decreased endurance, Difficulty walking, Impaired sensation, Pain, Impaired UE functional use, Decreased strength, Decreased safety awareness, Decreased knowledge of use of DME, Decreased activity tolerance, Decreased range of motion, Impaired tone, Improper body mechanics  Visit Diagnosis: Muscle weakness (generalized)  Difficulty in walking,  not elsewhere classified  Other abnormalities of gait and mobility  Hemiplegia and hemiparesis following cerebral infarction affecting right dominant side [facility])     Problem List Patient Active Problem List   Diagnosis Date Noted  . Anemia 05/31/2020  . Acute ischemic left MCA stroke (HCC) 04/30/2020  . Right hemiplegia (HCC) 04/30/2020  . CVA (cerebral vascular accident) (HCC) 04/21/2020  . Unspecified atrial fibrillation (HCC) 04/21/2020  . Essential hypertension 04/21/2020  . Alcohol abuse 04/21/2020    Tempie Donning, PT, DPT 08/14/2020, 12:51 PM  [facility] [address] [place], [place], [address] Phone: [phone]   Fax:  [phone]  Name: Lindsey [name] MRN: [id number] Date of Birth: [birth year]

## 2020-08-14 NOTE — Patient Instructions (Signed)
Standing at the [employer] with [employer], stand holding with Left Arm and stand for approx. 5 minutes, then rest, and complete again.   Seated in wheelchair with rolling walker placed in front and family member by for assistance, work on standing up and sitting back down x 5 times. Complete this every 2-3 hours. Make sure to have the right leg in a good position and to reach back with the left arm before sitting down.

## 2020-08-19 ENCOUNTER — Ambulatory Visit: Payer: Medicaid Other

## 2020-08-19 ENCOUNTER — Encounter: Payer: Self-pay | Admitting: Speech Pathology

## 2020-08-19 ENCOUNTER — Ambulatory Visit: Payer: Medicaid Other | Admitting: Speech Pathology

## 2020-08-19 ENCOUNTER — Other Ambulatory Visit: Payer: Self-pay

## 2020-08-19 DIAGNOSIS — R4701 Aphasia: Secondary | ICD-10-CM

## 2020-08-19 DIAGNOSIS — I69351 Hemiplegia and hemiparesis following cerebral infarction affecting right dominant side: Secondary | ICD-10-CM

## 2020-08-19 DIAGNOSIS — R262 Difficulty in walking, not elsewhere classified: Secondary | ICD-10-CM

## 2020-08-19 DIAGNOSIS — M6281 Muscle weakness (generalized): Secondary | ICD-10-CM

## 2020-08-19 DIAGNOSIS — R2689 Other abnormalities of gait and mobility: Secondary | ICD-10-CM

## 2020-08-19 NOTE — Therapy (Signed)
[facility] [address] [place], [place], [address] Phone: [phone]   Fax:  [phone]  Physical Therapy Treatment  Patient Details  Name: Lindsey [name] MRN: [id number] Date of Birth: October 10, 1955 Referring Provider (PT): Horton Chin, MD   Encounter Date: 08/19/2020   PT End of Session - 08/19/20 1405    Visit Number 3    Number of Visits 8    Date for PT Re-Evaluation 09/18/20   POC for 7 weeks   Authorization Type UHC Medicaid (VL: 27 between OT/PT/ST)    PT Start Time 1401    PT Stop Time 1445    PT Time Calculation (min) 44 min    Equipment Utilized During Treatment Gait belt    Activity Tolerance Patient tolerated treatment well    Behavior During Therapy WFL for tasks assessed/performed           Past Medical History:  Diagnosis Date  . Diverticulosis   . Hypertension   . Uterine prolapse     No past surgical history on file.  There were no vitals filed for this visit.   Subjective Assessment - 08/19/20 1403    Subjective Patient reports wearing brace 2 hours day. No pain reports when wearing brace. No falls. Reports standing at the countertop and sit <> stands at home are going well.    Patient is accompained by: Family member   Daughter   Pertinent History HTN, Alcohol Abuse    Limitations Standing;Walking    Patient Stated Goals Be able to walk    Currently in Pain? No/denies                             [facility] Adult PT Treatment/Exercise - 08/19/20 1419      Transfers   Transfers Sit to Stand;Stand to Sit    Sit to Stand 4: Min assist    Sit to Stand Details Tactile cues for weight shifting;Tactile cues for placement;Verbal cues for sequencing;Verbal cues for safe use of DME/AE;Verbal cues for technique;Manual facilitation for weight shifting    Stand to Sit 4: Min assist    Stand to Sit Details (indicate cue type and reason) Manual facilitation for  placement;Verbal cues for sequencing    Stand to Sit Details continue to require increased verbal cues to reach back for surface prior to descent    Comments completed sit <> stand training from mat with PT in front of patient, PT providing manual faciliation for midline alignment and improved weight shift to RLE. Patietn using UE suppotr on PT shoulder for stability and balance upon standing. Completed x 10 reps.Verbal/tactile cues required for improved forward lean with completion.       Ambulation/Gait   Ambulation/Gait Yes    Ambulation/Gait Assistance 4: Min assist    Ambulation/Gait Assistance Details PT donned patient's personal R AFO to RLE prior to ambulation. Completed ambulation with RW and R Hand Grip Attachment x 115 ft. Patient demo improved advancement of RLE today in comparison to last session, but still require Min A for placemetn with increased distance due to RLE fatigue. Verbal cues for improved step length and negotiation of RW with turns. Completed ambulation x 25 ft to RW with working on turning RW and alignment prior to descent. PT providing verbal cues for sequencing of RW and steps for improved technique with completion. Min A throughout for balance as needed     Ambulation Distance (  Feet) 115 Feet   x 1, 25 x 1   Assistive device Rolling walker   R AFO, R Hand Grip Attachment   Gait Pattern Step-to pattern;Decreased arm swing - right;Decreased step length - left;Decreased arm swing - left;Decreased step length - right;Decreased stance time - right    Ambulation Surface Level;Indoor      Self-Care   Self-Care Other Self-Care Comments    Other Self-Care Comments  PT assessed skin for any breakdown as began to increase wear time with AFO. no skin break down noted.       Neuro Re-ed    Neuro Re-ed Details  In supine position: completed heel slides with RLE, PT providing assistance at lateral knee to keep neutral alignment with completion. Patient able to actively completed  hip/knee flexion but limtied due to tone. verbal cues for controlled extension. PT educating grandson on proper completion to allow for completion at home. With RLE, completed bent knee fall out with active adduction to neutral, patietn demo difficulty controlling into abduction requiring assistance from PT, completed x 10 reps. PT completed passive PNF D1 Flexion/Extension pattern with RLE for tone management x 5 minutes with patient tolerating well.                  PT Education - 08/19/20 1619    Education Details Initial HEP; Provided Handout on [facility]    Person(s) Educated Patient;Other (comment)   Grandson   Methods Explanation    Comprehension Verbalized understanding            PT Short Term Goals - 07/31/20 1036      PT SHORT TERM GOAL #1   Title Patient will be independent with HEP for strengthening with caregiver assistance (ALL STGs Due:    Baseline no HEP established    Time 3    Period Weeks    Status New    Target Date 08/21/20      PT SHORT TERM GOAL #2   Title Patient will demo ability to complete sit <> stand with Min A and RW to demonstrate improved functional mobility    Baseline Mod A    Time 3    Period Weeks    Status New      PT SHORT TERM GOAL #3   Title Patient will demo ability to complete stand pivot transfer with RW from w/c <> mat with Min A to demonstrate improved functional mobility    Baseline TBA    Time 3    Period Weeks    Status New             PT Long Term Goals - 07/31/20 1038      PT LONG TERM GOAL #1   Title patient will be independent with final HEP with caregiver assistance (All LTGS Due: 09/18/20)    Baseline no HEP established    Time 7    Period Weeks    Status New    Target Date 09/18/20      PT LONG TERM GOAL #2   Title Patient will demo ability to complete sit <> stand with CGA and LRAD to demonstrate improved functional mobility    Baseline Mod A    Time 7    Period Weeks    Status  New      PT LONG TERM GOAL #3   Title Patient wll demo ability to complete all bed mobility with CGA to demonstrate improved funcitonal mobility and reduced  caregiver assistance    Baseline TBA    Time 7    Period Weeks    Status New      PT LONG TERM GOAL #4   Title Patient will demo ability to ambulate > 20 ft with LRAD and R AFO donned with Mod A for improved household mobility    Baseline TBA    Time 7    Period Weeks    Status New                 Plan - 08/19/20 1832    Clinical Impression Statement Continued transfer training and sit <> stand training, patient overall still requiring Min A for completion. Patient able to ambulate x 115 ft with RW, demonstrating improved tolerance for activites. Began completion of supine strengthening exercises as tolerated, with PT initiating home HEP with heel slides. Will continue to progress toward all goals.    Personal Factors and Comorbidities Comorbidity 2    Comorbidities HTN, Alcohol Abuse, Diverticulosis    Examination-Activity Limitations Bed Mobility;Bathing;Sit;Transfers;Dressing;Stairs;Stand;Locomotion Level;Toileting    Examination-Participation Restrictions Occupation;Community Activity;Driving    Stability/Clinical Decision Making Evolving/Moderate complexity    Rehab Potential Good    PT Frequency 1x / week    PT Duration --   7 weeks   PT Treatment/Interventions ADLs/Self Care Home Management;Electrical Stimulation;DME Instruction;Gait training;Stair training;Therapeutic activities;Functional mobility training;Therapeutic exercise;Balance training;Neuromuscular re-education;Patient/family education;Orthotic Fit/Training;Wheelchair mobility training;Manual techniques;Passive range of motion    PT Next Visit Plan continue gait trianing; try supine exercises for RLE; provide daughter with hope clinic information    Consulted and Agree with Plan of Care Patient;Family member/caregiver    Family Member Consulted Daughter            Patient will benefit from skilled therapeutic intervention in order to improve the following deficits and impairments:  Abnormal gait, Decreased balance, Decreased mobility, Decreased endurance, Difficulty walking, Impaired sensation, Pain, Impaired UE functional use, Decreased strength, Decreased safety awareness, Decreased knowledge of use of DME, Decreased activity tolerance, Decreased range of motion, Impaired tone, Improper body mechanics  Visit Diagnosis: Muscle weakness (generalized)  Difficulty in walking, not elsewhere classified  Other abnormalities of gait and mobility  Hemiplegia and hemiparesis following cerebral infarction affecting right dominant side [facility])     Problem List Patient Active Problem List   Diagnosis Date Noted  . Anemia 05/31/2020  . Acute ischemic left MCA stroke (HCC) 04/30/2020  . Right hemiplegia (HCC) 04/30/2020  . CVA (cerebral vascular accident) (HCC) 04/21/2020  . Unspecified atrial fibrillation (HCC) 04/21/2020  . Essential hypertension 04/21/2020  . Alcohol abuse 04/21/2020    Tempie Donning, PT, DPT 08/19/2020, 6:35 PM  [facility] [address] [place], [place], [address] Phone: [phone]   Fax:  [phone]  Name: Lindsey [name] MRN: [id number] Date of Birth: 06/05/1956

## 2020-08-19 NOTE — Patient Instructions (Signed)
      Tips for Talking with People who have Aphasia  Say one thing at a time Don't  rush - slow down, be patient Talk face to face Reduce background noise Relax - be natural Use pen and paper Write down key words Draw diagrams or pictures Don't pretend you understand Ask what helps Recap - check you both understand Be a partner, not a therapist   Aphasia does not affect intelligence, only language. The person with aphasia can still: make decisions, have opinions, and socialize.   Describing words  What group does it belong to?  What do I use it for?  Where can I find it?  What does it LOOK like?  What other words go with it?  What is the 1st sound of the word?   Many Ways to Communicate  Describe it Write it Draw it Gesture it Use related words  Resources  [facility] [facility] [place] with Dr. Jessica Obermeyer at [employer] - email [email or web address]  TalkPath Therapy app by [employer] Virtual Connections on [employer] website National Aphasia Association - naa.aphasia.org Aphasia Recovery Connection - aphasiarecoveryconnection.org Tactus therapy apps Constant Therapy  Watch: Patience Listening and Communicating with Aphasia Patients on YouTube https://www.youtube.com/watch?v=aPTTjRTmgq0   Provided by: Lavayah Vita L. ST, [phone]   

## 2020-08-19 NOTE — Patient Instructions (Signed)
Access Code: [email or web address] URL: [email or web address] Date: 08/19/2020 Prepared by: Jethro Bastos  Exercises Supine Heel Slide - 1 x daily - 5 x weekly - 1 sets - 10 reps

## 2020-08-21 NOTE — Therapy (Addendum)
[name] [address] [place], [place], [address] Phone: [phone]   Fax:  [phone]  Speech Language Pathology Evaluation  Patient Details  Name: Lindsey [name] MRN: [id number] Date of Birth: 1956/06/27 Referring Provider (SLP): Dr. Sula Soda   Encounter Date: 08/19/2020   End of Session - 08/21/20 0906    Visit Number 1    Number of Visits 25    Date for SLP Re-Evaluation 11/13/20    Authorization Type [employer] medicaid    SLP Start Time 1232    SLP Stop Time  1316    SLP Time Calculation (min) 44 min    Activity Tolerance Patient tolerated treatment well           Past Medical History:  Diagnosis Date  . Diverticulosis   . Hypertension   . Uterine prolapse     History reviewed. No pertinent surgical history.  There were no vitals filed for this visit.       SLP Evaluation [facility] - 08/21/20 0904      SLP Visit Information   SLP Received On 08/19/20    Referring Provider (SLP) Dr. Sula Soda    Onset Date 04/21/20    Medical Diagnosis L MCA CVA      Subjective   Patient/Family Stated Goal "To talk clearer and know what to say the 1st time without help"      General Information   HPI [name] is a 64 y.o. female with history of HTN who presented on 04/21/2020 with slurred speech and right-sided weakness.  Stroke work-up revealed left MCA infarct with hemorrhagic transformation, embolic most likely secondary to new onset A. fib.  CTA showed left M2 occlusion but unfortunately not surgical candidate due to late presentation.  No antithrombotic PTA and initiated aspirin with recommendation of initiating anticoagulation after 3 to 5 days due to hemorrhagic transformation for new onset A. Fib. HTN stable and resumed home meds amlodipine and atenolol.  She was hospitalized 04/21/20 to 05/28/20 including CIR    Mobility Status in wc - on PT caseload      Prior Functional Status    Cognitive/Linguistic Baseline Within functional limits    Type of Home House     Lives With Daughter    Available Support Family    Vocation Full time employment      Cognition   Overall Cognitive Status Impaired/Different from baseline    Area of Impairment Attention;Memory;Following commands;Problem solving    Memory --   per daughter report     Auditory Comprehension   Overall Auditory Comprehension Impaired    Yes/No Questions Impaired    Basic Biographical Questions 76-100% accurate    Basic Immediate Environment Questions 75-100% accurate    Complex Questions 50-74% accurate    Commands Impaired    One Step Basic Commands 75-100% accurate    Two Step Basic Commands 75-100% accurate    Complex Commands 50-74% accurate    Conversation Simple    Interfering Components Processing speed;Motor planning    EffectiveTechniques Extra processing time;Repetition;Pausing;Slowed speech;Stressing words;Visual/Gestural cues      Reading Comprehension   Reading Status Not tested      Expression   Primary Mode of Expression Verbal      Verbal Expression   Overall Verbal Expression Impaired    Initiation No impairment    Automatic Speech --   WFL   Level of Generative/Spontaneous Verbalization Word    Repetition Impaired  Level of Impairment Sentence level    Naming Impairment    Responsive 51-75% accurate    Confrontation 25-49% accurate    Convergent Not tested    Divergent 0-24% accurate    Verbal Errors Semantic paraphasias;Neologisms;Perseveration;Jargon;Not aware of errors    Pragmatics No impairment    Effective Techniques Open ended questions;Semantic cues;Phonemic cues;Written cues;Sentence completion      Written Expression   Dominant Hand Right    Written Expression Not tested      Oral Motor/Sensory Function   Overall Oral Motor/Sensory Function Impaired    Labial Symmetry Abnormal symmetry right    Labial Strength Reduced Right    Labial Sensation Reduced  Right    Labial Coordination WFL    Lingual ROM Within Functional Limits    Facial ROM Within Functional Limits      Motor Speech   Overall Motor Speech Appears within functional limits for tasks assessed      Standardized Assessments   Standardized Assessments  Western Aphasia Battery revised    Western Aphasia Battery revised  45.8/100 - severe                           SLP Education - 08/21/20 0905    Education Details aphasia ed, use writing for choices and topics    Person(s) Educated Patient;Child(ren)    Methods Explanation;Demonstration;Verbal cues;Handout    Comprehension Verbal cues required;Need further instruction            SLP Short Term Goals - 08/21/20 0981      SLP SHORT TERM GOAL #1   Title Family will use written choices of 2 or 3 to ID pt's wants/needs with occasional min A over 2 weeks outsdie of therapy    Baseline No written cues or choices being used    Time 4    Period Weeks    Status New      SLP SHORT TERM GOAL #2   Title Pt will use multimodal communication/AAC to communicate preferences for meal/snack 3x over 2 weeks with occasional min to mod A from family    Baseline no alternative communication strategies in place    Time 4    Period Weeks    Status New      SLP SHORT TERM GOAL #3   Title Pt will use multimodal/AAC to ID family members she wants to communicate with or about with occasional min to mod A from family.    Time 4    Period Weeks    Status New      SLP SHORT TERM GOAL #4   Title Pt will name 3 items in simple personally relevant category using multimodal communication with occasional min A    Baseline no items in simple category    Time 6    Period Weeks    Status New      SLP SHORT TERM GOAL #5   Title Pt will ID 3 paraphasias with usual min to mod A over 4 sessions    Baseline no awareness of paraphasias or perseverations    Time 6    Period Weeks    Status New            SLP Long Term Goals  - 08/21/20 0944      SLP LONG TERM GOAL #1   Title Pt will use multimodal communication to answer 5 preferences or answer 5 questions accurately at thte word level with  min A from caregivers /ST over 3 sessions    Baseline other than yes/no, pt is not indicating preferences or answering basic questions accurately    Time 12    Period Weeks    Status New      SLP LONG TERM GOAL #2   Title Pt will ID 5 paraphasias in a session with usual min A and self correct with frequent mod A in naming task or conversation over 2 sessions    Baseline pt unaware of paraphasias and perseverations    Time 12    Period Weeks    Status New      SLP LONG TERM GOAL #3   Title Caregivers will use written or photo cues at 2-3 word level to facilitate 3 turns in a conversation with pt and provide assistance in pt following a conversation topic with occasional min A over 2 sessions.    Baseline no multimodal supports in place for conversation success    Time 12    Period Weeks    Status New      SLP LONG TERM GOAL #4   Title Pt will name 5 items in personally relevant category using multmodal communciation with occasional min A    Baseline no items in simple category    Time 12    Period Weeks    Status New      SLP LONG TERM GOAL #5   Title Pt will attend aphasia conversation group (pending available technology) and family will ID 3 online/community support for persons with aphasia and caregivers    Baseline no community support identified    Time 26    Period Weeks    Status New            Plan - 08/21/20 0919    Clinical Impression Statement Lindsey [name] is referred for outpt ST due to aphasia s/p CVA 04/21/20. She was hospitalized 04/21/20 through 05/28/20 including stay on CIR.Prio to this pt was independent and working full time in food service.  Lindsey [name] is accompanied by her daughter, Lindsey [name]. 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 Spontaneous speech and picture description with marked word finding difficulty and prominent paraphasias, however some grammar intact. Lindsey [name] also answered 1 word personal questions with paraphasia/neologism without awareness. Her daughter consistently re-answered personal questions. Lindsey [name] did not name any items in simple category. In confronation naming, she named 8/20 basic objects, with phonemic and semantic cues (she named 1/20 independently. Phonemic, semantic and perseveration noted on basic naming. (bean/ball, fork/knife; perseveration "cupee" for 5/20) without awareness. Auditory comprehension is intact to basic personal and evnironmental yes/no questions and to simple object ID. Lindsey [name] follows simple 1 step commands and requires repetition for 2 step commands. In conversation, Lindsey [name] benefitted from poiniting to a choice of 2 words, as well as gestures and slow rate to improve comprehension.,At this time, Lindsey [name] is communicating with her mother using "broad yes/no questions and then narrowing down the yes/no questions" They are not using mulitmodal communciation or external communication supports. Lindsey [name] became tearful during evaluation due to frustration communicating. I recommend skilled ST to maximize language as well as train pt and caregiver in compensatory strategies to improve efficiency of communciation to improve QOL and reduce caregiver burden.           Patient will benefit from skilled therapeutic intervention in order to improve the following deficits and impairments:   Aphasia   Managed medicaid CPT codes:  speech therapy 92507 Cognitive function [id number]; [id number]    Problem List Patient Active Problem List   Diagnosis Date Noted  . Anemia 05/31/2020  . Acute ischemic left MCA stroke (HCC) 04/30/2020  . Right hemiplegia (HCC) 04/30/2020  . CVA (cerebral vascular accident) (HCC) 04/21/2020  . Unspecified atrial fibrillation (HCC) 04/21/2020  . Essential  hypertension 04/21/2020  . Alcohol abuse 04/21/2020    Boden Stucky, Radene JourneyLaura Ann MS, CCC-SLP 08/21/2020, 9:52 AM  [facility] [address] [place], [place], [address] Phone: [phone]   Fax:  [phone]  Name: Lindsey [name] MRN: [id number] Date of Birth: 01/17/1956

## 2020-08-25 ENCOUNTER — Encounter: Payer: Self-pay | Admitting: Physical Medicine and Rehabilitation

## 2020-08-25 ENCOUNTER — Other Ambulatory Visit: Payer: Self-pay

## 2020-08-25 ENCOUNTER — Ambulatory Visit: Payer: Medicaid Other

## 2020-08-25 ENCOUNTER — Telehealth: Payer: Self-pay | Admitting: Physical Medicine and Rehabilitation

## 2020-08-25 ENCOUNTER — Encounter: Payer: Medicaid Other | Attending: Registered Nurse | Admitting: Physical Medicine and Rehabilitation

## 2020-08-25 VITALS — BP 102/69 | HR 77 | Temp 98.4°F | Ht 65.0 in

## 2020-08-25 DIAGNOSIS — M6281 Muscle weakness (generalized): Secondary | ICD-10-CM

## 2020-08-25 DIAGNOSIS — G8191 Hemiplegia, unspecified affecting right dominant side: Secondary | ICD-10-CM | POA: Insufficient documentation

## 2020-08-25 DIAGNOSIS — I63512 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 middle cerebral artery: Secondary | ICD-10-CM | POA: Insufficient documentation

## 2020-08-25 DIAGNOSIS — I10 Essential (primary) hypertension: Secondary | ICD-10-CM | POA: Insufficient documentation

## 2020-08-25 DIAGNOSIS — R4701 Aphasia: Secondary | ICD-10-CM | POA: Insufficient documentation

## 2020-08-25 DIAGNOSIS — R2689 Other abnormalities of gait and mobility: Secondary | ICD-10-CM

## 2020-08-25 DIAGNOSIS — R262 Difficulty in walking, not elsewhere classified: Secondary | ICD-10-CM

## 2020-08-25 DIAGNOSIS — I69351 Hemiplegia and hemiparesis following cerebral infarction affecting right dominant side: Secondary | ICD-10-CM

## 2020-08-25 MED ORDER — AMLODIPINE BESYLATE 5 MG PO TABS: 5.0000 mg | ORAL_TABLET | Freq: Every day | ORAL | 3 refills | Status: DC

## 2020-08-25 NOTE — Telephone Encounter (Signed)
Patient would like to know who they can get inconstans supplies from.

## 2020-08-25 NOTE — Progress Notes (Signed)
Subjective:    Patient ID: Lindsey [name], female    DOB: May 17, 1956, 64 y.o.   MRN: [id number]  HPI: Lindsey [name] is a 64 y.o. female who is here for Hospital Follow Up of Her Acute Ischemic Left MCA Stroke, Right Hemiplegia and Essential Hypertension. [name] was brought to [facility] ED on 07/202/2021 with right sided weakness and slurred speech.   She has been working on balance in therapy. She had her SLP evaluation and next week has her OT eval next week. She has been ambulating. She needs assistance to walk and her daughter does not feel comfortable walking with her on her own- right now she ambulates mostly with physical therapy.   She his using the bedside commode. Her daughter has been helping her. She works evening.   Lindsey [name] says things have been going well at home.   Moving bowels regularly.   Her BP is low in office to 100/69. Her daughter has ordered a BP machine.   She does not need any refills this visit.   Denies pain at this time. Has been taking the Gabapentin BID  Denies depression but does have sadness  Has been eating well.    CT Head WO Contrast:  IMPRESSION: 1. There is a large hypodensity of the anterior left MCA territory, findings consistent with acute to subacute left MCA territory infarction. ASPECTS 4.  2.  No evidence of hemorrhage.  3. There is mild mass effect on the left lateral ventricle with approximately 3 mm left right midline shift.  CT ANGIO Head/Neck:  IMPRESSION: 1. 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 Acute occlusion inferior division left M2  . 3. No other intracranial stenosis 4. No extracranial stenosis in the carotid or vertebral arteries. 5. These results were called by telephone at the time of interpretation on 04/21/2020 at 6:59 pm to provider JOSHUA LONG , who verbally acknowledged these results.  MRI Brain:  IMPRESSION: Acute infarct left MCA territory there is mild hemorrhage in the infarct which is not  seen on recent CT earlier today.  Lindsey [name] was admitted to Inpatient Rehabilitation on 04/30/2020 and discharged home on 05/29/2020. She is receiving Home Health Therapy with [facility]. Ms. Eckmann was ale to answer yes and no questions, her speech noted to be Language of confusion at times. This provider spoke with Lindsey [name] daughter Lindsey [name], after assessment, she answered all provider questions and provider answered her questions. [name] was unable to be in room with her mother Lindsey [name] due to being febrile, she was encouraged to F/U with her doctor as well, she verbalizes understanding.   Pain Inventory Average Pain 8 Pain Right Now 0 My pain is intermittent, sharp, burning, stabbing and aching  LOCATION OF PAIN    BOWEL Number of stools per week: 3-4 Oral laxative use No  Type of laxative  Enema or suppository use No  History of colostomy No  Incontinent No   BLADDER Normal In and out cath, frequency  Able to self cath No  Bladder incontinence Yes  Frequent urination No  Leakage with coughing No  Difficulty starting stream No  Incomplete bladder emptying No    Mobility ability to climb steps?  no do you drive?  no use a wheelchair needs help with transfers - Function disabled: date disabled 04/2020 I need assistance with the following:  feeding, dressing, bathing, toileting, meal prep, household duties and shopping  Neuro/Psych trouble walking confusion depression  Prior Studies  Any changes since last visit?  no  Physicians involved in your care Any changes since last visit?  no   Family History  Problem Relation Age of Onset  . Breast cancer Neg Hx    Social History   Socioeconomic History  . Marital status: Single    Spouse name: Not on file  . Number of children: Not on file  . Years of education: Not on file  . Highest education level: Not on file  Occupational History  . Not on file  Tobacco Use  . Smoking status: Never Smoker   . Smokeless tobacco: Never Used  Vaping Use  . Vaping Use: Never used  Substance and Sexual Activity  . Alcohol use: Yes    Alcohol/week: 0.0 standard drinks    Comment: states once a wine/beer occassionally   . Drug use: No  . Sexual activity: Never  Other Topics Concern  . Not on file  Social History Narrative  . Not on file   Social Determinants of Health   Financial Resource Strain:   . Difficulty of Paying Living Expenses: Not on file  Food Insecurity:   . Worried About Programme researcher, broadcasting/film/video in the Last Year: Not on file  . Ran Out of Food in the Last Year: Not on file  Transportation Needs:   . Lack of Transportation (Medical): Not on file  . Lack of Transportation (Non-Medical): Not on file  Physical Activity:   . Days of Exercise per Week: Not on file  . Minutes of Exercise per Session: Not on file  Stress:   . Feeling of Stress : Not on file  Social Connections:   . Frequency of Communication with Friends and Family: Not on file  . Frequency of Social Gatherings with Friends and Family: Not on file  . Attends Religious Services: Not on file  . Active Member of Clubs or Organizations: Not on file  . Attends Banker Meetings: Not on file  . Marital Status: Not on file   History reviewed. No pertinent surgical history. Past Medical History:  Diagnosis Date  . Diverticulosis   . Hypertension   . Uterine prolapse    BP 102/69   Pulse 77   Temp 98.4 F (36.9 C)   Ht 5\' 5"  (1.651 m)   BMI 27.46 kg/m   Opioid Risk Score:   Fall Risk Score:  `1  Depression screen PHQ 2/9  Depression screen PHQ 2/9 06/15/2020  Decreased Interest 1  Down, Depressed, Hopeless 1  PHQ - 2 Score 2  Altered sleeping 0  Tired, decreased energy 1  Change in appetite 0  Feeling bad or failure about yourself  1  Trouble concentrating 0  Moving slowly or fidgety/restless 3  Suicidal thoughts 0  PHQ-9 Score 7  Difficult doing work/chores Somewhat difficult    Review of Systems  Musculoskeletal: Positive for gait problem.       Spasms  Neurological: Positive for weakness.  Psychiatric/Behavioral: Positive for confusion and dysphoric mood.  All other systems reviewed and are negative.      Objective:   General: Alert and Oriented X2 , No apparent distress HEENT: Head is normocephalic, atraumatic, PERRLA, EOMI, sclera anicteric, oral mucosa pink and moist, dentition intact, ext ear canals clear,  Neck: Supple without JVD or lymphadenopathy Heart: Reg rate and rhythm. No murmurs rubs or gallops Chest: CTA bilaterally without wheezes, rales, or rhonchi; no distress Abdomen: Soft, non-tender, non-distended, bowel sounds positive. Extremities: No clubbing,  cyanosis, or edema. Pulses are 2+ Skin: Clean and intact without signs of breakdown   Gen: no distress, normal appearing HEENT: oral mucosa pink and moist, NCAT Cardio: Reg rate Chest: normal effort, normal rate of breathing Abd: soft, non-distended Ext: no edema Skin: intact Neuro: Arrived in wheelchair  Musculoskeletal: Normal Muscle Bulk and Muscle Testing Reveals:  Upper Extremities: Right Upper Extremity: Paralysis Left Lower Extremity: Full ROM and Muscle Strength 5/5  Lower Extremities: Right Lower Extremity: Paralysis Left Lower Extremity: Full ROM and Muscle Strength 5/5 Severe expressive >receptive aphasia Psych: Pt's affect is appropriate. Pt is cooperative    Assessment & Plan:  1. Acute Ischemic Left MCA Stroke:Right Hemiplegia: Continue outpatient OT, PT, and SLP. She has a HFU appointment with Neurology.   2. Essential Hypertension: BP is currently soft. Medications reviewed and she is taking Amlodipine 10mg . Decrease to 5mg . Check BP at home and bring log to follow-up appointment.   3. Prevention of pressure injury: recommended offloading every hour. Lying prone and supine in bed at times to offload her sacrum.   4. Appetite: improved. Encouraged eating nutritious  foods and discussed recommendations.  Turmeric to reduce inflammation--can be used in cooking or taken as a supplement.  Benefits of turmeric:  -Highly anti-inflammatory  -Increases antioxidants  -Improves memory, attention, brain disease  -Lowers risk of heart disease  -May help prevent cancer  -Decreases pain  -Alleviates depression  -Delays aging and decreases risk of chronic disease  -Consume with black pepper to increase absorption    Turmeric Milk Recipe:  1 cup milk  1 tsp turmeric  1 tsp cinnamon  1 tsp grated ginger (optional)  Black pepper (boosts the anti-inflammatory properties of turmeric).  1 tsp honey  5) Depression: -Improved, can wean Mirtazepine next visit.  -Counseled regarding making time for the activities she loves every day.  6) Pain well controlled: may use Gabapentin as needed.   7) HLD: advised wild caught salmon once per week and walnuts every day. On 40mg  statin daily.   All questions were encouraged and answered.  F/U in 1 month.

## 2020-08-25 NOTE — Telephone Encounter (Signed)
They can get from their pharmacy or medical supply store, if they are having trouble finding, they can call me at [phone], thank you!

## 2020-08-25 NOTE — Therapy (Signed)
[facility] [address] [place], [place], [address] Phone: [phone]   Fax:  [phone]  Physical Therapy Treatment  Patient Details  Name: Lindsey [name] MRN: [id number] Date of Birth: [birth year] Referring Provider (PT): Horton Chin, MD   Encounter Date: 08/25/2020   PT End of Session - 08/25/20 0931    Visit Number 4    Number of Visits 8    Date for PT Re-Evaluation 09/18/20   POC for 7 weeks   Authorization Type [employer] Medicaid (VL: 27 between OT/PT/ST)    PT Start Time 0930    PT Stop Time 1015    PT Time Calculation (min) 45 min    Equipment Utilized During Treatment Gait belt    Activity Tolerance Patient tolerated treatment well    Behavior During Therapy WFL for tasks assessed/performed           Past Medical History:  Diagnosis Date  . Diverticulosis   . Hypertension   . Uterine prolapse     History reviewed. No pertinent surgical history.  There were no vitals filed for this visit.   Subjective Assessment - 08/25/20 0933    Subjective Patient reports been doing well. Reports that exercises are going well. Daughter reports wearing the brace approx 1 hour. No falls or pain to report.    Patient is accompained by: Family member   Daughter   Pertinent History HTN, Alcohol Abuse    Limitations Standing;Walking    Patient Stated Goals Be able to walk    Currently in Pain? No/denies               [facility] Adult PT Treatment/Exercise - 08/25/20 0001      Transfers   Transfers Sit to Stand;Stand to Sit;Squat Pivot Transfers    Sit to Stand 4: Min assist    Sit to Stand Details Tactile cues for weight shifting;Tactile cues for placement;Verbal cues for sequencing;Verbal cues for safe use of DME/AE;Verbal cues for technique;Manual facilitation for weight shifting    Sit to Stand Details (indicate cue type and reason) continue to provide verbal cues for hand placement    Stand to Sit 4: Min  assist;4: Min guard    Stand to Sit Details (indicate cue type and reason) Manual facilitation for placement;Verbal cues for sequencing    Stand to Sit Details initially require Min A for control into seated position but then following reps able to demo improved control and reachc back to surface with UE to control descent    Squat Pivot Transfers 4: Min assist;3: Mod assist    Squat Pivot Transfer Details (indicate cue type and reason) due to not having AFO donned to provide RLE with support, PT compelted squat pivot transfer with patient from mat <> w/c    Number of Reps Other reps (comment)   7 reps   Comments continue to provide verbal cues for improved forward lean to promote improved technique, PT providing manual faciliation at pelvis and trunk to improved alignment with transfers.       Ambulation/Gait   Ambulation/Gait Yes    Ambulation/Gait Assistance 4: Min assist    Ambulation/Gait Assistance Details PT donned patient's personal R AFO to RLE prior to ambulation. Completed ambulation x 25 ft, patient requesting to stop due to increased pain on foot. PT removed personal AFO and mild redness noted on big toe and tenderness. Patient's daughter requesting to continue walking with AFO, PT donned clinic AFO that would allow  for more room in current shoe and be able to complete ambulation. PT donned R Posterior [place], increased recurvatum noted and requiring PT assistance to avoid. Min A required throughout x 115 ft, intermittent standing rest breaks required. Patient has increased balance challenge upon standing and placing RLE onto hand grip.     Ambulation Distance (Feet) 25 Feet   x 1, 115 x 1   Assistive device Rolling walker    Gait Pattern Step-to pattern;Decreased arm swing - right;Decreased step length - left;Decreased arm swing - left;Decreased step length - right;Decreased stance time - right    Ambulation Surface Level;Indoor      Self-Care   Self-Care Other Self-Care Comments     Other Self-Care Comments  PT educating on continue monitor R Big Toe due to increased redness and reporting to avoid wearing AFO until larger shoe is purchased to allow for improved space in R Shoe. Once new shoe is obtained, PT educating to slowly progress wearing AFO 2-3 hours.       Therapeutic Activites    Therapeutic Activities Other Therapeutic Activities    Other Therapeutic Activities Completed standing activity with PT placed  in front, providing manual faciliation for improved weight shift to RLE. Patient able to stand 25 - 30 seconds without UE support and close supervision from PT. Min A required at times due to increased balance challenge.                   PT Education - 08/25/20 1154    Education Details Educating on monitoring skin; Need for larger shoe to accomodate personal AFO    Person(s) Educated Patient;Child(ren)    Methods Explanation    Comprehension Verbalized understanding            PT Short Term Goals - 07/31/20 1036      PT SHORT TERM GOAL #1   Title Patient will be independent with HEP for strengthening with caregiver assistance (ALL STGs Due:    Baseline no HEP established    Time 3    Period Weeks    Status New    Target Date 08/21/20      PT SHORT TERM GOAL #2   Title Patient will demo ability to complete sit <> stand with Min A and RW to demonstrate improved functional mobility    Baseline Mod A    Time 3    Period Weeks    Status New      PT SHORT TERM GOAL #3   Title Patient will demo ability to complete stand pivot transfer with RW from w/c <> mat with Min A to demonstrate improved functional mobility    Baseline TBA    Time 3    Period Weeks    Status New             PT Long Term Goals - 07/31/20 1038      PT LONG TERM GOAL #1   Title patient will be independent with final HEP with caregiver assistance (All LTGS Due: 09/18/20)    Baseline no HEP established    Time 7    Period Weeks    Status New    Target Date  09/18/20      PT LONG TERM GOAL #2   Title Patient will demo ability to complete sit <> stand with CGA and LRAD to demonstrate improved functional mobility    Baseline Mod A    Time 7    Period Weeks  Status New      PT LONG TERM GOAL #3   Title Patient wll demo ability to complete all bed mobility with CGA to demonstrate improved funcitonal mobility and reduced caregiver assistance    Baseline TBA    Time 7    Period Weeks    Status New      PT LONG TERM GOAL #4   Title Patient will demo ability to ambulate > 20 ft with LRAD and R AFO donned with Mod A for improved household mobility    Baseline TBA    Time 7    Period Weeks    Status New                 Plan - 08/25/20 1226    Clinical Impression Statement Continued transfer training and gait training today, still requiring Min A. Limited during session today due to increased pain on R Big Toe. Patient has mild redness on toe, PT educating on need for larger shoe to allow for room for AFO. Patient and daughter verbalizing understanding. Will continue per POC.    Personal Factors and Comorbidities Comorbidity 2    Comorbidities HTN, Alcohol Abuse, Diverticulosis    Examination-Activity Limitations Bed Mobility;Bathing;Sit;Transfers;Dressing;Stairs;Stand;Locomotion Level;Toileting    Examination-Participation Restrictions Occupation;Community Activity;Driving    Stability/Clinical Decision Making Evolving/Moderate complexity    Rehab Potential Good    PT Frequency 1x / week    PT Duration --   7 weeks   PT Treatment/Interventions ADLs/Self Care Home Management;Electrical Stimulation;DME Instruction;Gait training;Stair training;Therapeutic activities;Functional mobility training;Therapeutic exercise;Balance training;Neuromuscular re-education;Patient/family education;Orthotic Fit/Training;Wheelchair mobility training;Manual techniques;Passive range of motion    PT Next Visit Plan did we get new shoe? continue gait  trianing; try supine exercises for RLE; provide daughter with hope clinic information    Consulted and Agree with Plan of Care Patient;Family member/caregiver    Family Member Consulted Daughter           Patient will benefit from skilled therapeutic intervention in order to improve the following deficits and impairments:  Abnormal gait, Decreased balance, Decreased mobility, Decreased endurance, Difficulty walking, Impaired sensation, Pain, Impaired UE functional use, Decreased strength, Decreased safety awareness, Decreased knowledge of use of DME, Decreased activity tolerance, Decreased range of motion, Impaired tone, Improper body mechanics  Visit Diagnosis: Muscle weakness (generalized)  Difficulty in walking, not elsewhere classified  Other abnormalities of gait and mobility  Hemiplegia and hemiparesis following cerebral infarction affecting right dominant side [facility])     Problem List Patient Active Problem List   Diagnosis Date Noted  . Anemia 05/31/2020  . Acute ischemic left MCA stroke (HCC) 04/30/2020  . Right hemiplegia (HCC) 04/30/2020  . CVA (cerebral vascular accident) (HCC) 04/21/2020  . Unspecified atrial fibrillation (HCC) 04/21/2020  . Essential hypertension 04/21/2020  . Alcohol abuse 04/21/2020    Tempie Donning, PT, DPT 08/25/2020, 12:34 PM  [facility] [address] [place], [place], [address] Phone: [phone]   Fax:  [phone]  Name: Lindsey [name] MRN: [id number] Date of Birth: [birth year]

## 2020-08-25 NOTE — Patient Instructions (Signed)
.  Turmeric to reduce inflammation--can be used in cooking or taken as a supplement.  Benefits of turmeric:  -Highly anti-inflammatory  -Increases antioxidants  -Improves memory, attention, brain disease  -Lowers risk of heart disease  -May help prevent cancer  -Decreases pain  -Alleviates depression  -Delays aging and decreases risk of chronic disease  -Consume with black pepper to increase absorption    Turmeric Milk Recipe:  1 cup milk  1 tsp turmeric  1 tsp cinnamon  1 tsp grated ginger (optional)  Black pepper (boosts the anti-inflammatory properties of turmeric).  1 tsp honey 

## 2020-08-25 NOTE — Telephone Encounter (Signed)
Spoke to daughter [name] and she states she took prescription to [address] and a Water quality scientist in [place] and they would not fill. I gave her some name and numbers of medical supply stores in [place].

## 2020-08-26 ENCOUNTER — Encounter: Payer: Self-pay | Admitting: *Deleted

## 2020-08-26 ENCOUNTER — Ambulatory Visit
Admission: RE | Admit: 2020-08-26 | Discharge: 2020-08-26 | Disposition: A | Discharge status: Home / Self Care | Payer: Medicaid Other | Source: Ambulatory Visit | Attending: Oncology | Admitting: Oncology

## 2020-08-26 ENCOUNTER — Ambulatory Visit: Payer: Medicaid Other | Attending: Oncology | Admitting: *Deleted

## 2020-08-26 VITALS — BP 113/78 | HR 71 | Temp 98.3°F

## 2020-08-26 DIAGNOSIS — N63 Unspecified lump in unspecified breast: Secondary | ICD-10-CM

## 2020-08-26 DIAGNOSIS — M21371 Foot drop, right foot: Secondary | ICD-10-CM | POA: Diagnosis not present

## 2020-08-26 IMAGING — US US BREAST*L* LIMITED INC AXILLA
1 series · 7 of 7 positions shown · non-contrast
Comparison: Previous exam(s).

CLINICAL DATA: Palpable LEFT axillary lymph node. History of a
stroke limiting RIGHT-sided mobility.

EXAM:
DIGITAL DIAGNOSTIC BILATERAL MAMMOGRAM WITH TOMO AND CAD; ULTRASOUND
LEFT BREAST LIMITED

[Series 1: us breast*left* limited inc axilla · 0.07mm/px · 7 of 7 slices shown]
[im 1/7]
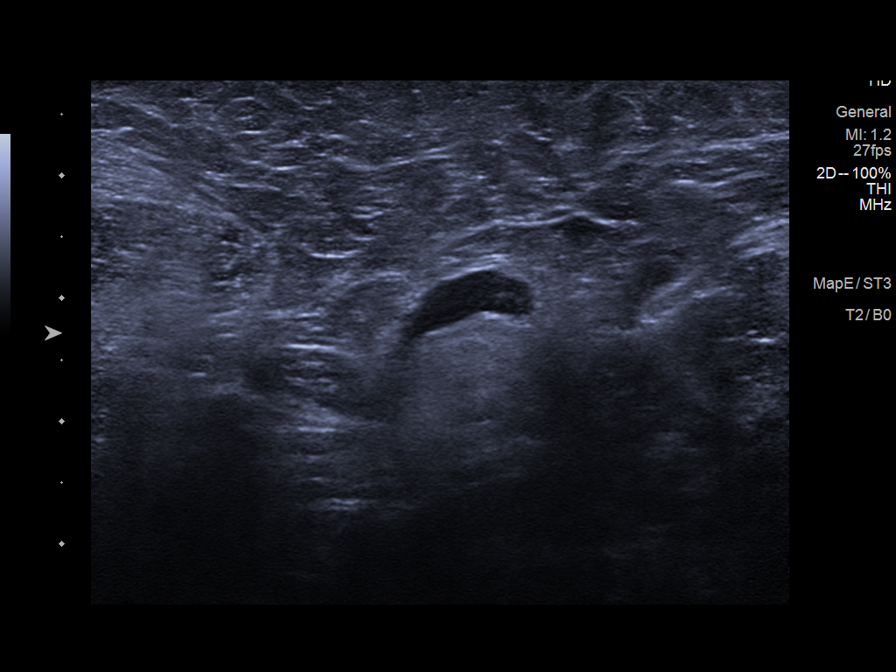
[im 2/7]
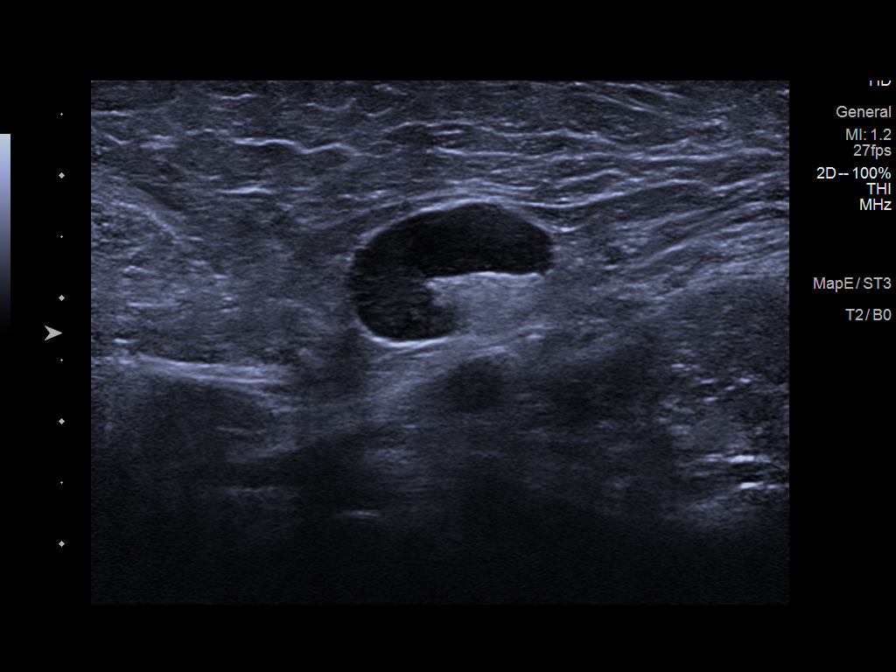
[im 3/7]
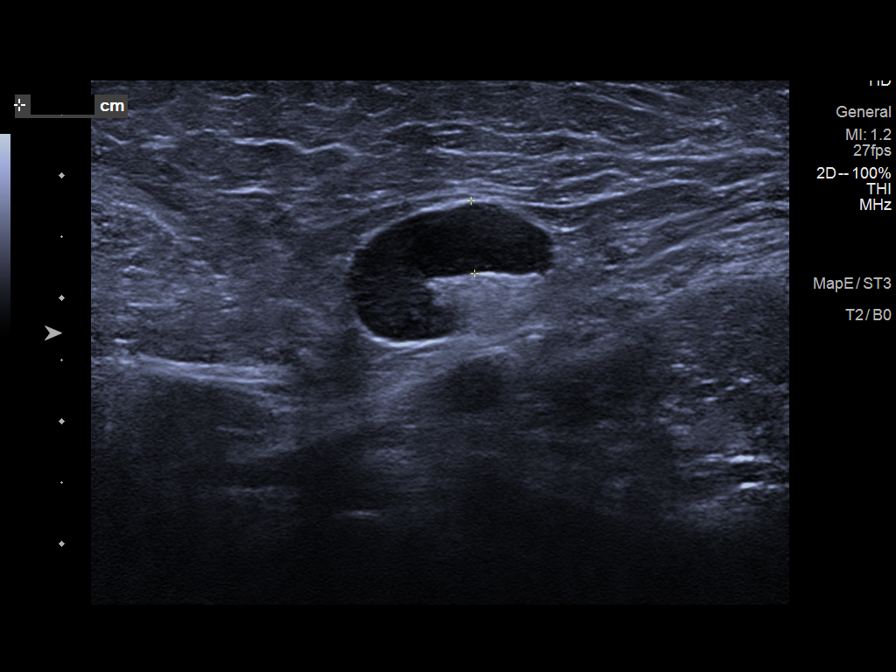
[im 4/7]
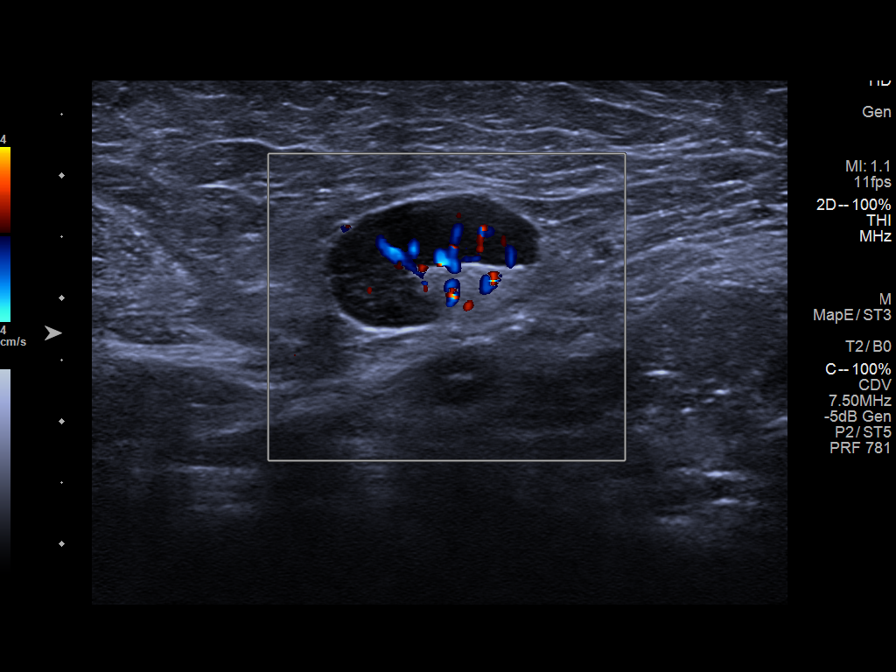
[im 5/7]
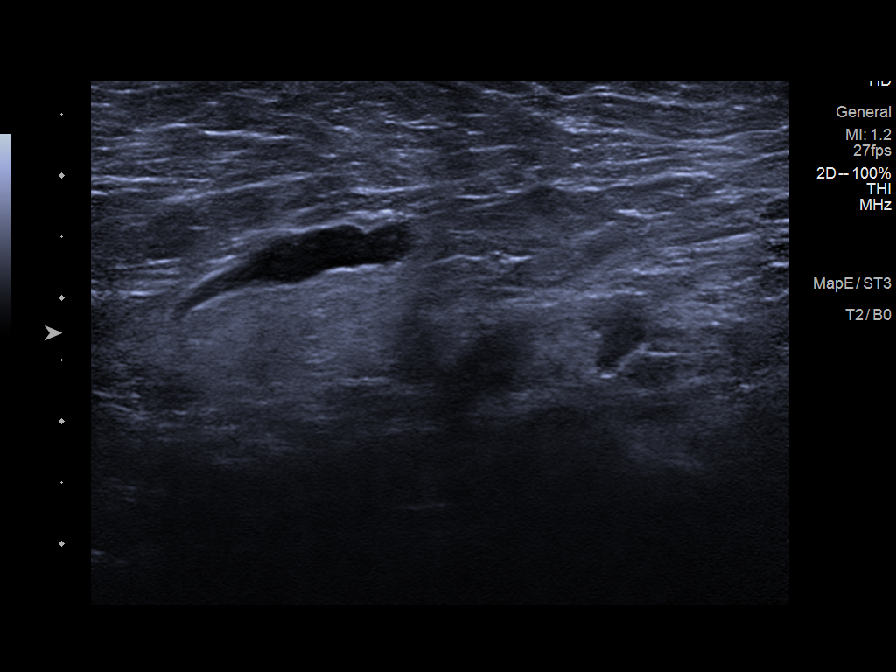
[im 6/7]
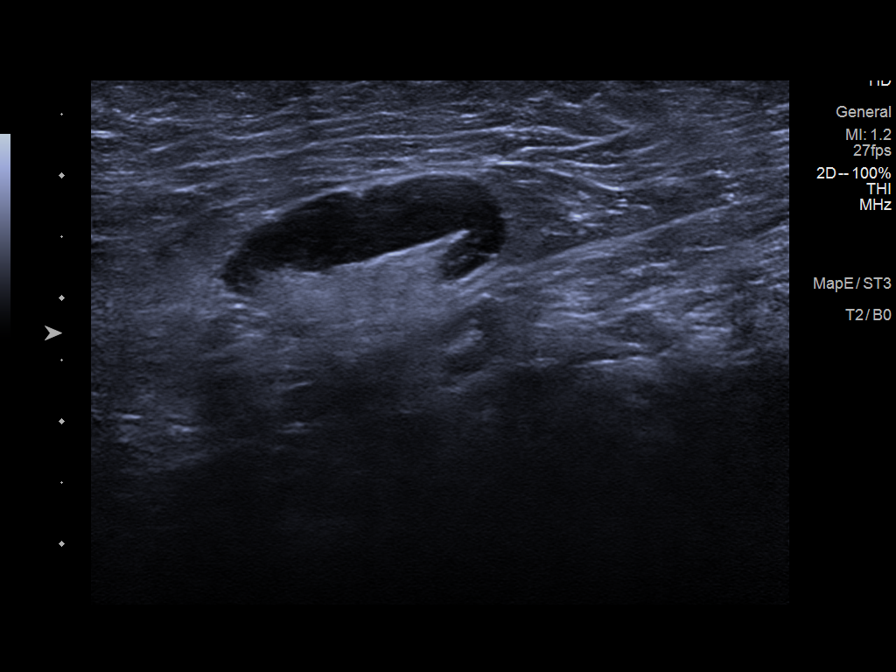
[im 7/7]
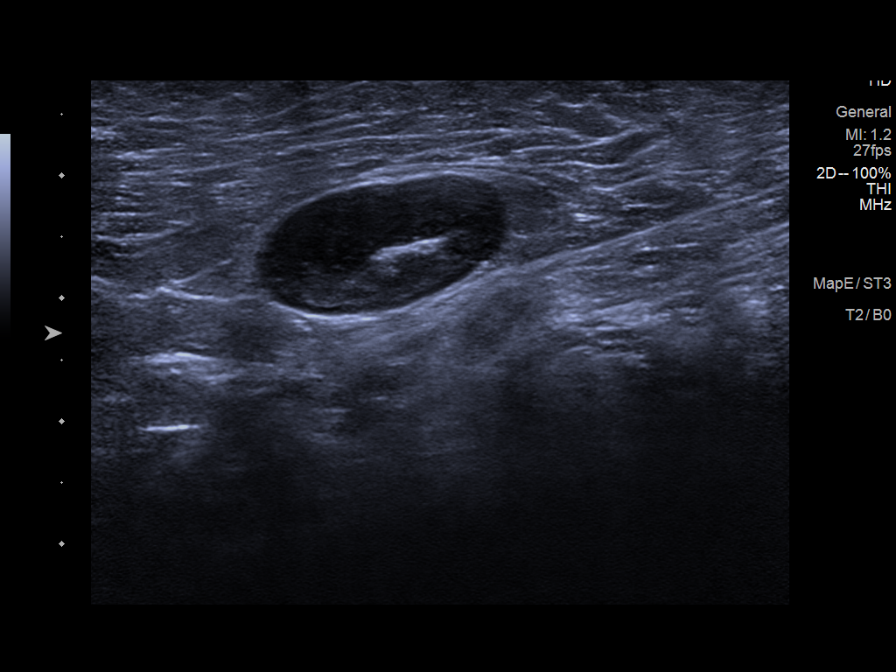

[7 of 7 positions shown; findings below may reference images not displayed]

ACR Breast Density Category b: There are scattered areas of
fibroglandular density.
FINDINGS: There are persistently enlarged LEFT axillary lymph nodes
(approximately 3 seen mammographically). No suspicious masses,
distortion or calcifications in bilateral breasts. Limited
evaluation of the RIGHT breast secondary to inability to optimally
position patient due to history of CVA.

Mammographic images were processed with CAD.

On physical exam, there are several palpable LEFT axillary lymph
nodes. Evaluation is limited secondary to inability to optimally
position patient.

Targeted ultrasound was performed of the LEFT axilla. There are
multiple mildly enlarged LEFT axillary lymph nodes. Representative
LEFT axillary lymph node demonstrates a echogenic hila but a
smoothly thickened cortex of 6 mm. An additional LEFT axillary lymph
node demonstrates a smoothly thickened cortex of 5 mm. This is
mildly increased in comparison to prior. Patient denies any history
of recent vaccination or history of autoimmune disorder.
IMPRESSION: 1. Persistent diffuse enlargement of LEFT axillary lymph nodes.
Recommend ultrasound-guided biopsy of a LEFT axillary lymph node
with a thickened cortex for further evaluation to evaluate for
underlying lymphoproliferative disorder or occult malignancy.

2.  No mammographic evidence of malignancy in bilateral breasts.

RECOMMENDATION:
LEFT axillary ultrasound-guided biopsy x1.

I have discussed the findings and recommendations with the patient
and patient's daughter. If applicable, a reminder letter will be
sent to the patient regarding the next appointment. Patient will be
scheduled for biopsy at her earliest convenience by the schedulers
and ordering provider will be notified.

BI-RADS CATEGORY  4: Suspicious.

## 2020-08-26 IMAGING — MG DIGITAL DIAGNOSTIC BILAT W/ TOMO W/ CAD
8 series · 8 of 24 positions shown · non-contrast
Comparison: Previous exam(s).

CLINICAL DATA: Palpable LEFT axillary lymph node. History of a
stroke limiting RIGHT-sided mobility.

EXAM:
DIGITAL DIAGNOSTIC BILATERAL MAMMOGRAM WITH TOMO AND CAD; ULTRASOUND
LEFT BREAST LIMITED

[R CC synth-2D]
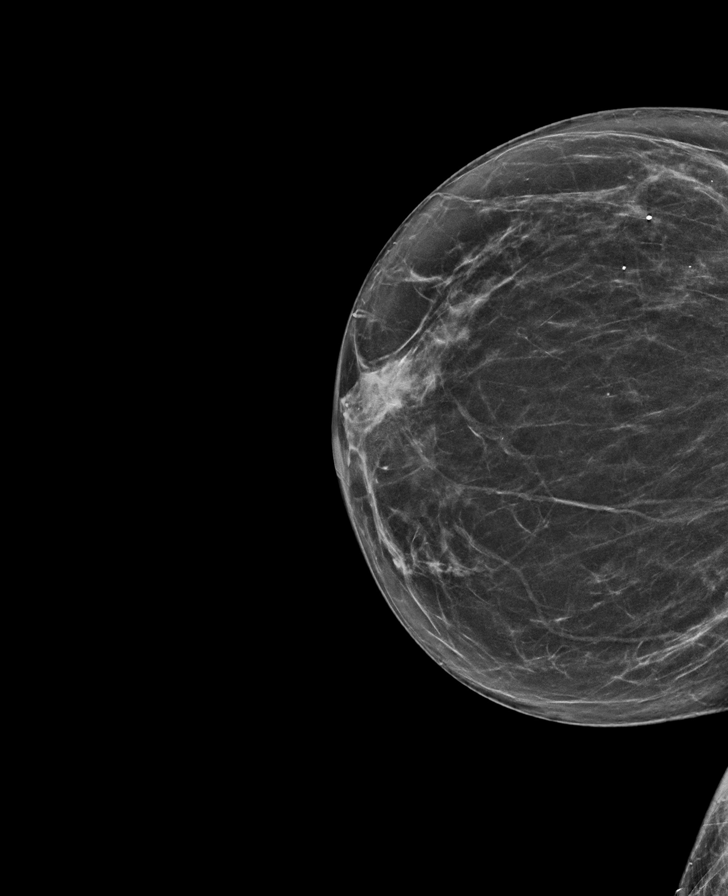

[L MLO synth-2D]
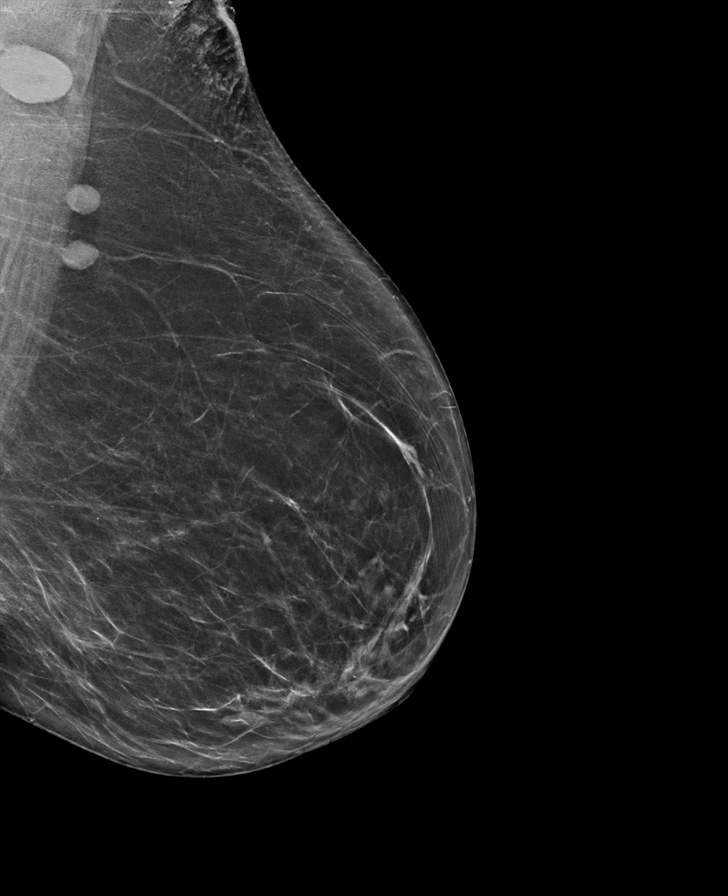

[R MLO synth-2D]
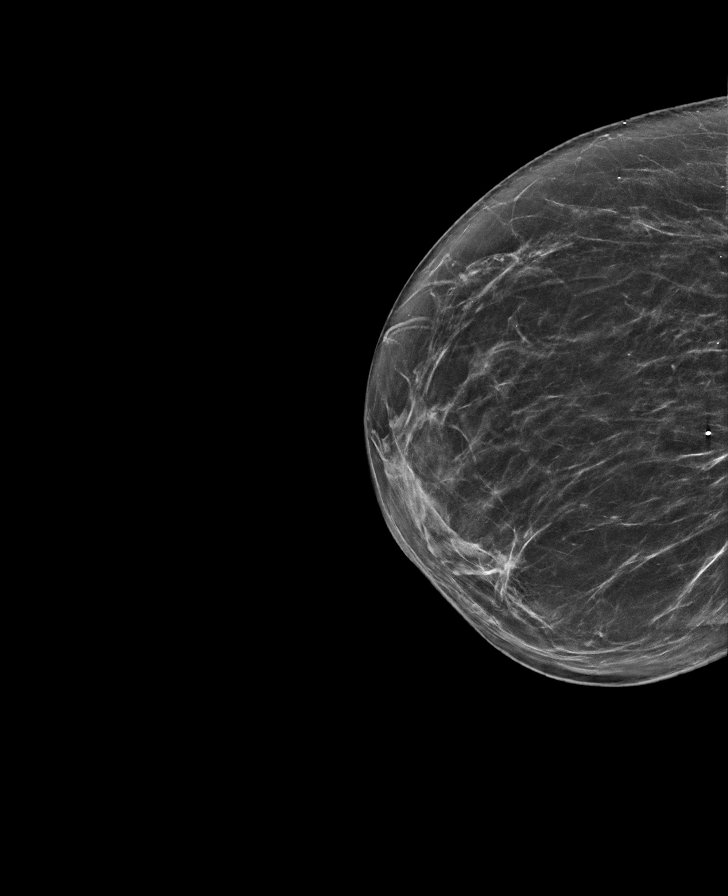

[L CC synth-2D]
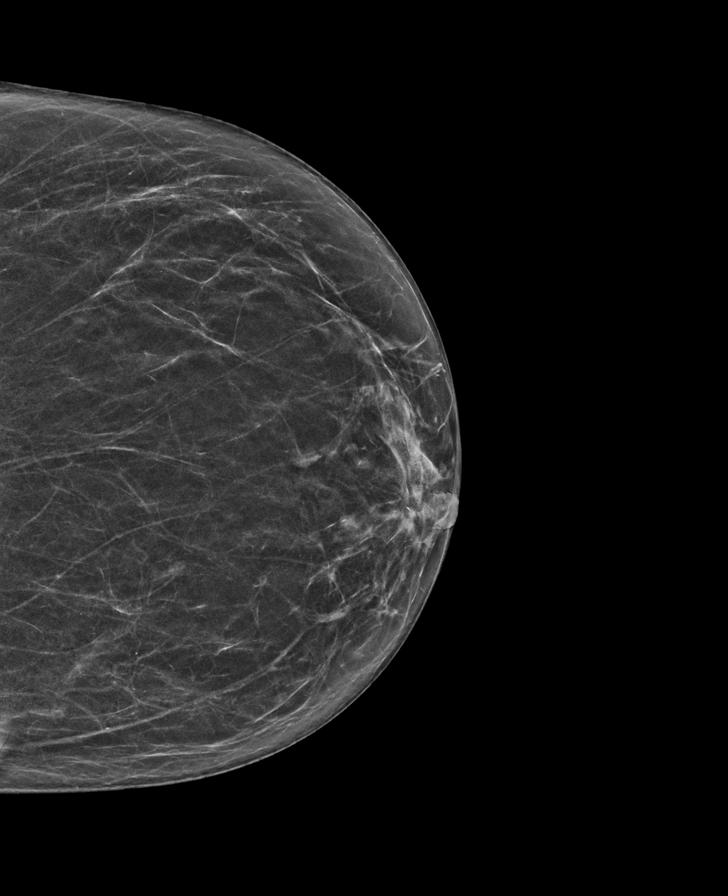

[R MLO tomo · tomo slice 39/76.0]
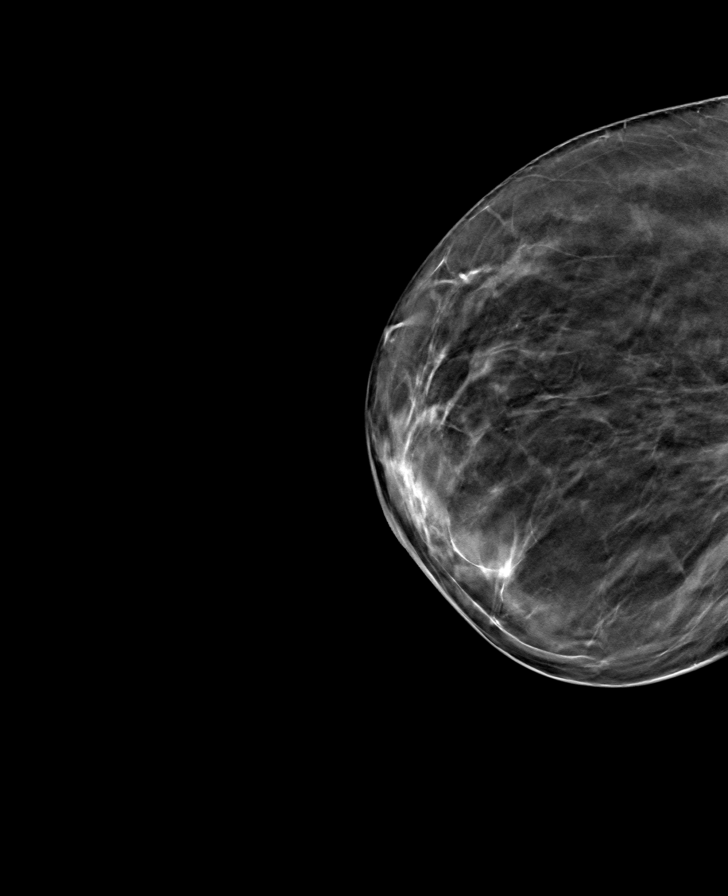

[R CC tomo · tomo slice 32/63.0]
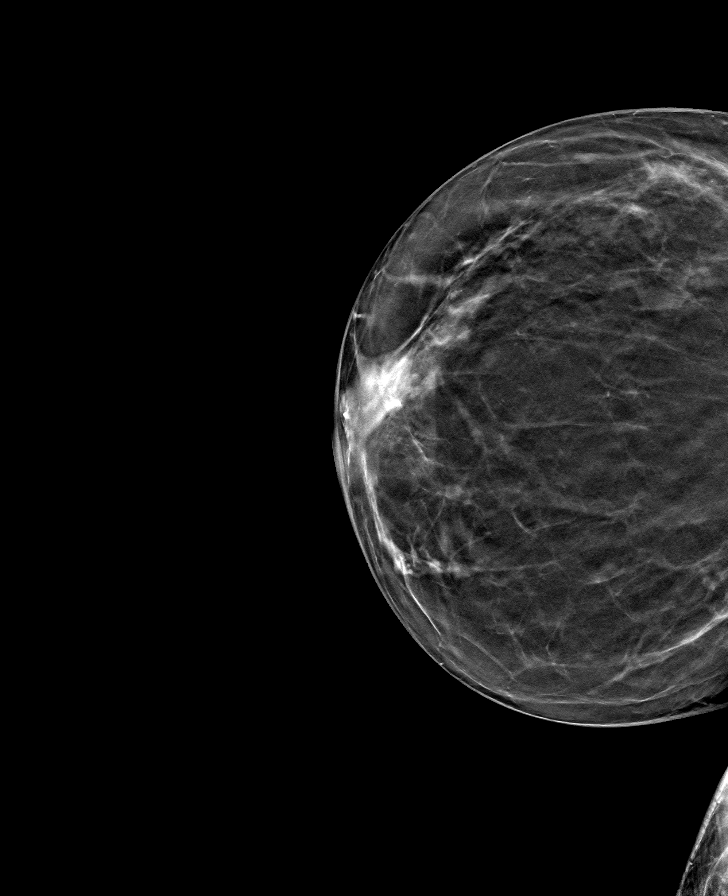

[L MLO tomo · tomo slice 37/73.0]
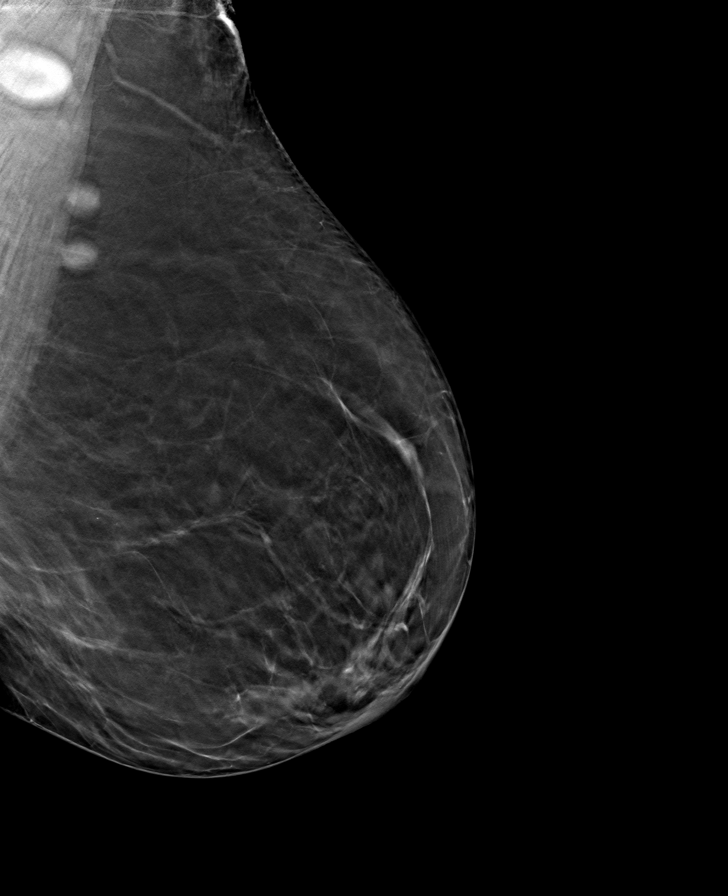

[L CC tomo · tomo slice 29/56.0]
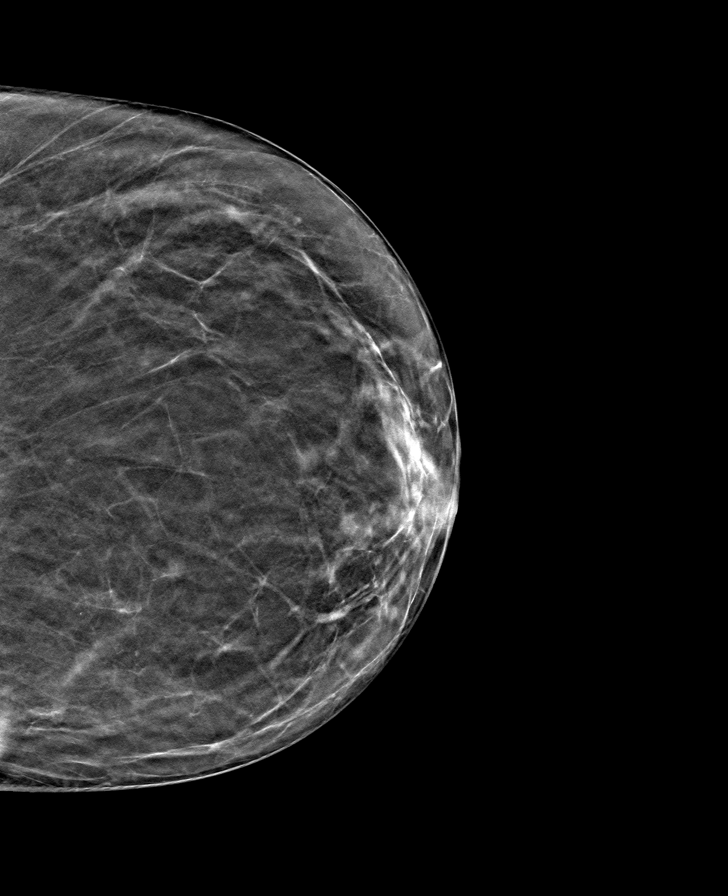

[8 of 24 positions shown; findings below may reference images not displayed]

ACR Breast Density Category b: There are scattered areas of
fibroglandular density.
FINDINGS: There are persistently enlarged LEFT axillary lymph nodes
(approximately 3 seen mammographically). No suspicious masses,
distortion or calcifications in bilateral breasts. Limited
evaluation of the RIGHT breast secondary to inability to optimally
position patient due to history of CVA.

Mammographic images were processed with CAD.

On physical exam, there are several palpable LEFT axillary lymph
nodes. Evaluation is limited secondary to inability to optimally
position patient.

Targeted ultrasound was performed of the LEFT axilla. There are
multiple mildly enlarged LEFT axillary lymph nodes. Representative
LEFT axillary lymph node demonstrates a echogenic hila but a
smoothly thickened cortex of 6 mm. An additional LEFT axillary lymph
node demonstrates a smoothly thickened cortex of 5 mm. This is
mildly increased in comparison to prior. Patient denies any history
of recent vaccination or history of autoimmune disorder.
IMPRESSION: 1. Persistent diffuse enlargement of LEFT axillary lymph nodes.
Recommend ultrasound-guided biopsy of a LEFT axillary lymph node
with a thickened cortex for further evaluation to evaluate for
underlying lymphoproliferative disorder or occult malignancy.

2.  No mammographic evidence of malignancy in bilateral breasts.

RECOMMENDATION:
LEFT axillary ultrasound-guided biopsy x1.

I have discussed the findings and recommendations with the patient
and patient's daughter. If applicable, a reminder letter will be
sent to the patient regarding the next appointment. Patient will be
scheduled for biopsy at her earliest convenience by the schedulers
and ordering provider will be notified.

BI-RADS CATEGORY  4: Suspicious.

## 2020-08-26 NOTE — Patient Instructions (Signed)
Gave patient hand-out, Women Staying Healthy, Active and Well from [facility], with education on breast health, pap smears, heart and colon health. 

## 2020-08-26 NOTE — Progress Notes (Signed)
Subjective:     Patient ID: Lindsey [name], female   DOB: 05-06-1956, 64 y.o.   MRN: [id number]  HPI   [facility] Medical History Record - 08/26/20 6160      Breast History   Screening cycle New    Provider (CBE) Phineas Real    Initial Mammogram 08/26/20    Last Mammogram Annual   [place] 3 [place] 11/25/19   Last Mammogram Date 12/20/17    Provider (Mammogram)  Delford Field    Recent Breast Symptoms None   following borderline lymph nodes; left axilla     Breast Cancer History   Breast Cancer History No personal or family history      Previous History of Breast Problems   Breast Surgery or Biopsy None    Breast Implants N/A    BSE Done Never      Gynecological/Obstetrical History   LMP --   2006   Is there any chance that the client could be pregnant?  No    Age at menarche 61    Age at menopause 43    PAP smear history Annually    Date of last PAP  10/28/15    Provider (PAP) [facility] -/-    Age at first live birth 33    Breast fed children No    DES Exposure No    Cervical, Uterine or Ovarian cancer No    Family history of Cervial, Uterine or Ovarian cancer No    Hysterectomy No    Cervix removed No    Ovaries removed No    Laser/Cryosurgery No    Current method of birth control None    Current method of Estrogen/Hormone replacement None    Smoking history None             Review of Systems     Objective:   Physical Exam Chest:     Breasts:        Right: No swelling, bleeding, inverted nipple, mass, nipple discharge, skin change or tenderness.        Left: No swelling, bleeding, inverted nipple, mass, nipple discharge or skin change.    Lymphadenopathy:     Upper Body:     Right upper body: No supraclavicular, axillary or pectoral adenopathy.     Left upper body: Axillary adenopathy present. No supraclavicular or pectoral adenopathy.        Assessment:     64 year old female returns to [facility] for 6 month follow up mammogram and annual screening.  She is in a  wheelchair today and is accompanied by daughter Lindsey [name].  Patient had a CVA in July.  She is aphasic, and has right sided paralysis.  Clinical breast exam preformed in the wheelchair.   On clinical breast exam I can palpate an approximate 2 cm firm left axillary lymph node.  Reviewed possibility of further work-up of that left axillary lymph node.  Last pap on 10/28/15 was negative / negative.  Next pap due in January 2020.  Deferred pap today due to immobility.  Risk Assessment    Risk Scores      08/26/2020 08/07/2019   Last edited by: Scarlett Presto, RN Scarlett Presto, RN   5-year risk: 1.6 % 1.6 %   Lifetime risk: 6.2 % 6.3 %             Plan:     Will get bilateral diagnostic mammogram and ultrasound ordered.  Will follow up per [facility] protocol.

## 2020-08-31 ENCOUNTER — Other Ambulatory Visit: Payer: Self-pay | Admitting: *Deleted

## 2020-08-31 ENCOUNTER — Other Ambulatory Visit: Payer: Self-pay | Admitting: Oncology

## 2020-08-31 DIAGNOSIS — N63 Unspecified lump in unspecified breast: Secondary | ICD-10-CM

## 2020-09-02 ENCOUNTER — Ambulatory Visit
Admission: RE | Admit: 2020-09-02 | Discharge: 2020-09-02 | Disposition: A | Discharge status: Home / Self Care | Payer: Medicaid Other | Source: Ambulatory Visit | Attending: Oncology | Admitting: Oncology

## 2020-09-02 ENCOUNTER — Other Ambulatory Visit: Payer: Self-pay

## 2020-09-02 DIAGNOSIS — N63 Unspecified lump in unspecified breast: Secondary | ICD-10-CM

## 2020-09-02 DIAGNOSIS — R590 Localized enlarged lymph nodes: Secondary | ICD-10-CM | POA: Diagnosis not present

## 2020-09-02 IMAGING — MG US BREAST BX W LOC DEV 1ST LESION IMG BX SPEC US GUIDE*L*
1 series · 8 of 8 positions shown · non-contrast
Comparison: Previous exam(s).
COMPARISON: Previous exam(s).

Addendum:
CLINICAL DATA: Enlarged left axillary adenopathy.

EXAM:
ULTRASOUND GUIDED LEFT BREAST CORE NEEDLE BIOPSY

[Series 1: MG view · 0.07mm/px · 8 of 12 slices shown]
[im 1/12]
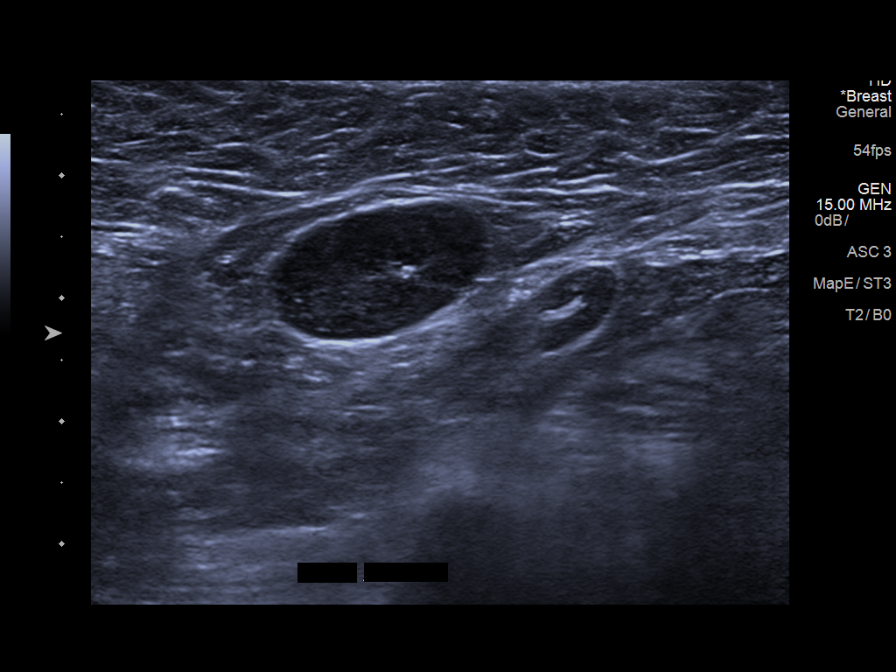
[im 2/12]
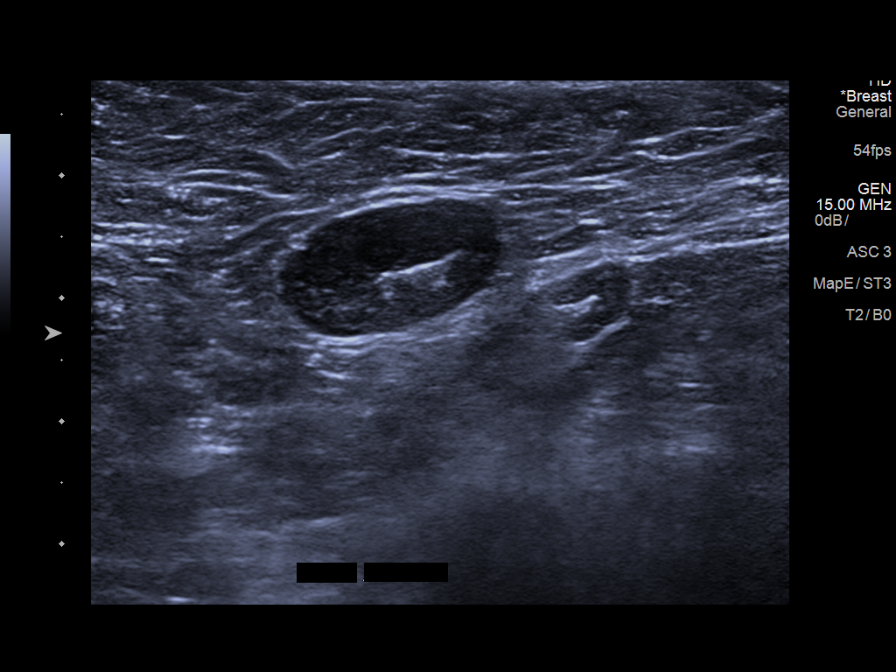
[im 4/12]
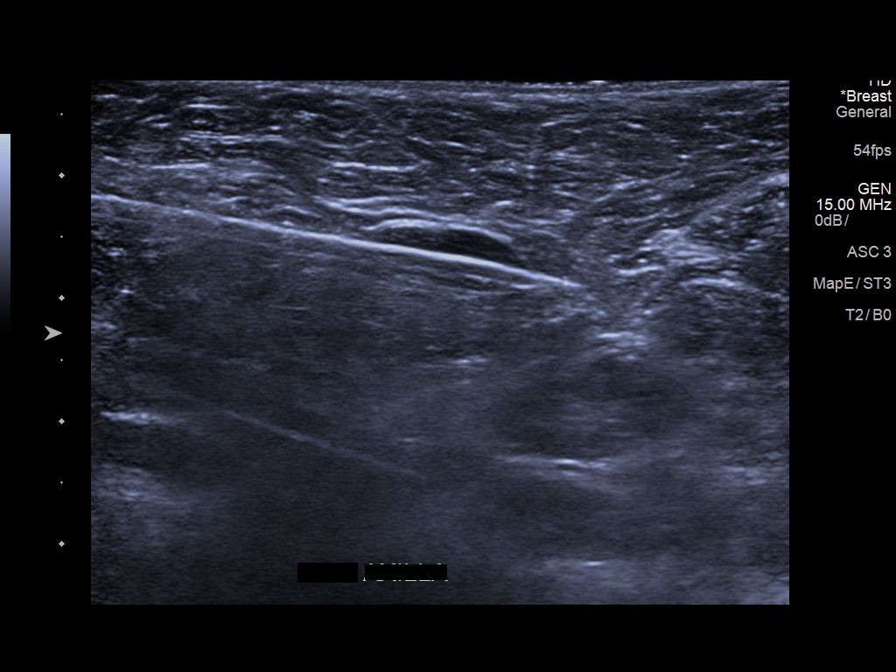
[im 5/12]
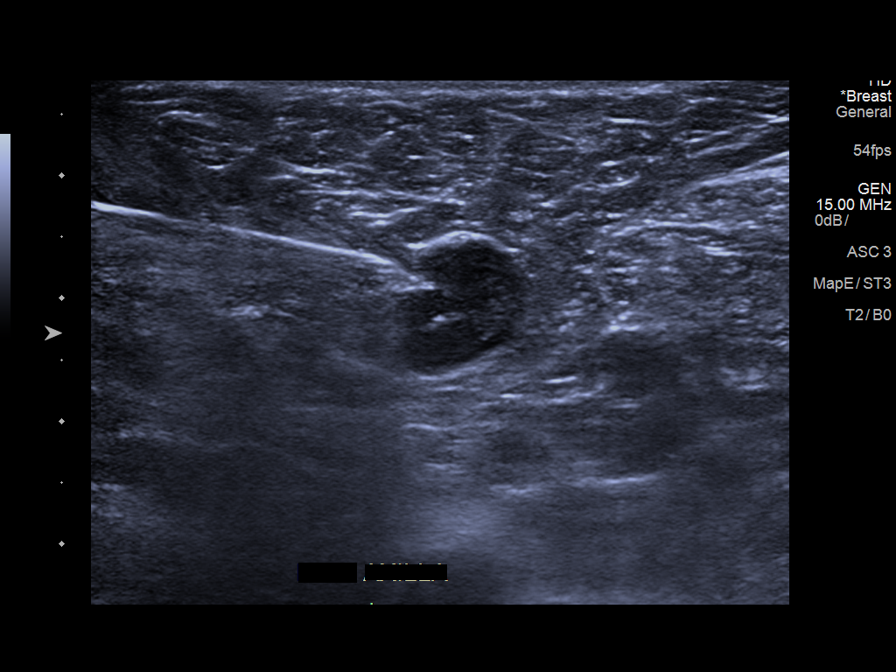
[im 7/12]
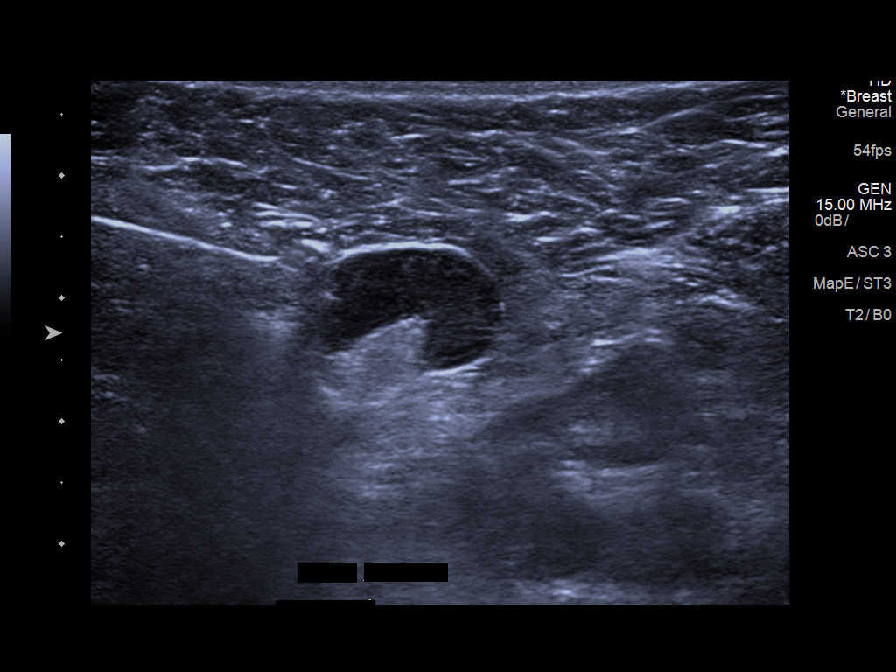
[im 8/12]
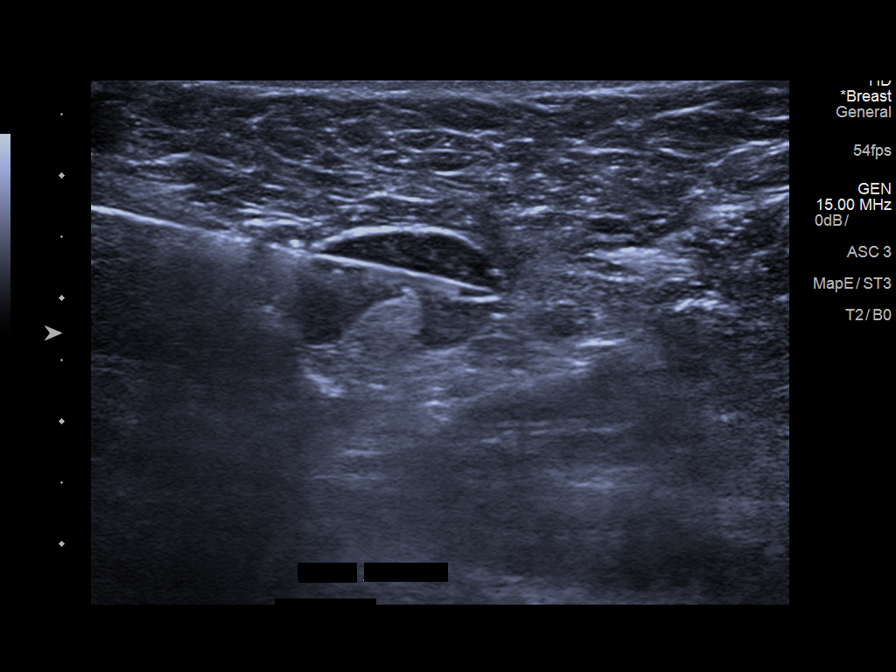
[im 10/12]
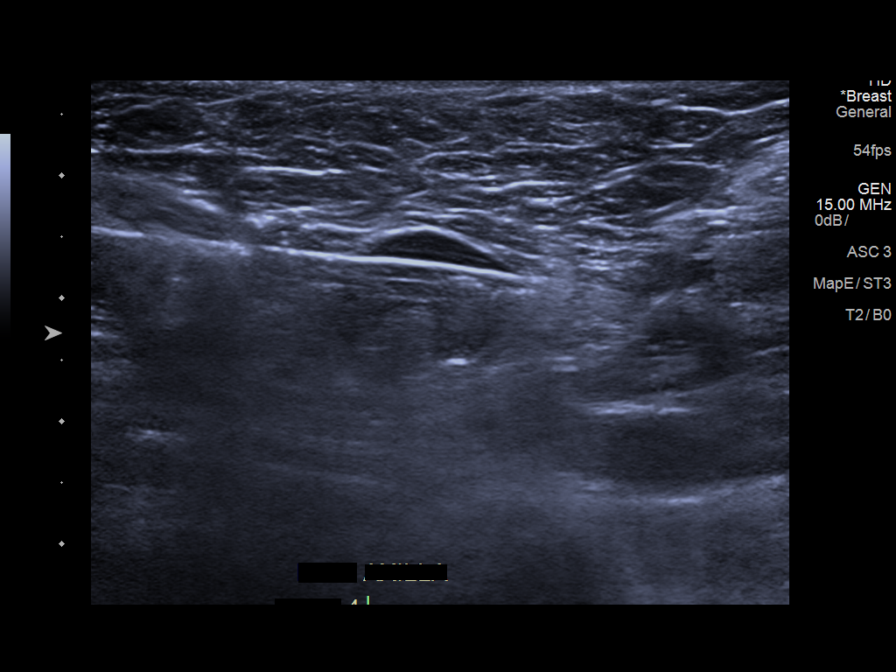
[im 12/12]
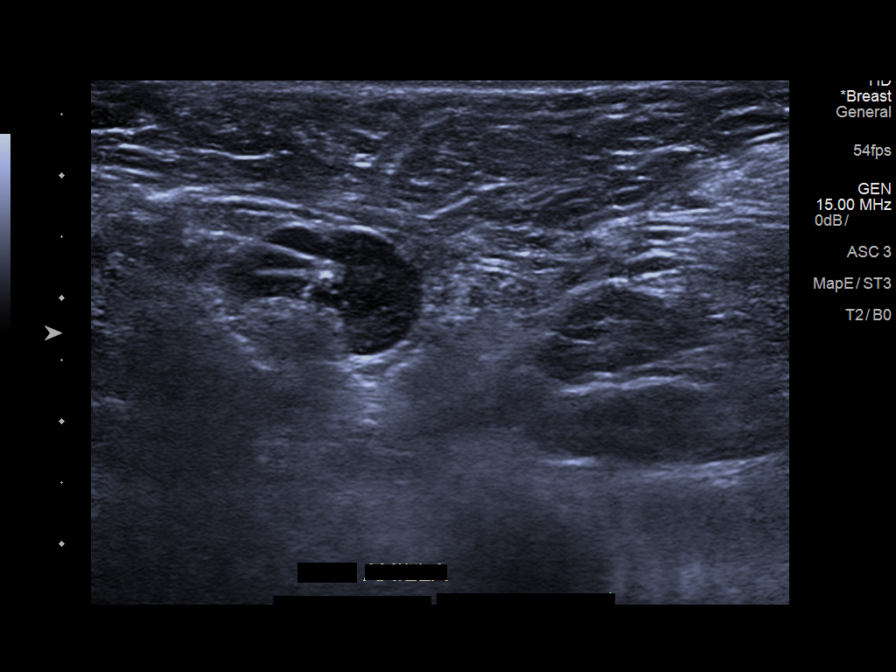

[8 of 8 positions shown; findings below may reference images not displayed]



Lesion quadrant: Left axilla

Using sterile technique and 1% lidocaine and 1% lidocaine with
epinephrine as local anesthetic, under direct ultrasound
visualization, a 14 gauge OLSEN device was used to perform
biopsy of a left axillary lymph node using a lateral to medial
approach. At the conclusion of the procedure HydroMARK 4 butterfly
tissue marker clip was deployed into the biopsy cavity. Follow up 2
view mammogram was performed and dictated separately.
IMPRESSION: Ultrasound guided biopsy of the left axilla. No apparent
complications.

ADDENDUM:
PATHOLOGY revealed: A. LYMPH NODE, LEFT AXILLA; ULTRASOUND-GUIDED
NEEDLE CORE BIOPSY: - REACTIVE FOLLICULAR HYPERPLASIA. - NEGATIVE
FOR MALIGNANCY. - NO IMMUNOPHENOTYPIC EVIDENCE OF A B-CELL OR T-CELL
LYMPHOPROLIFERATIVE PROCESS.

Pathology results are CONCORDANT with imaging findings, per Dr. OLSEN
OLSEN.

Pathology results and recommendations below were discussed with
patient by telephone on [DATE]. Patient reported biopsy site
within normal limits with slight tenderness at the site. Post biopsy
care instructions were reviewed, questions were answered and my
direct phone number was provided to patient. Patient was instructed
to call [HOSPITAL] if any concerns or questions arise
related to the biopsy.

Recommendation: Patient instructed to continue with monthly self
breast examinations, clinical follow-up with provider as needed, and
return for annual bilateral screening mammogram due [DATE].

Pathology results reported by OLSEN RN on [DATE].



Lesion quadrant: Left axilla

Using sterile technique and 1% lidocaine and 1% lidocaine with
epinephrine as local anesthetic, under direct ultrasound
visualization, a 14 gauge OLSEN device was used to perform
biopsy of a left axillary lymph node using a lateral to medial
approach. At the conclusion of the procedure HydroMARK 4 butterfly
tissue marker clip was deployed into the biopsy cavity. Follow up 2
view mammogram was performed and dictated separately.
IMPRESSION: Ultrasound guided biopsy of the left axilla. No apparent
complications.

## 2020-09-02 IMAGING — MG MM BREAST LOCALIZATION CLIP
4 series · 4 of 12 positions shown · non-contrast
Comparison: Previous exam(s).

CLINICAL DATA: Status post ultrasound-guided core biopsy of a left
axillary lymph node.

EXAM:
3D DIAGNOSTIC LEFT MAMMOGRAM POST ULTRASOUND BIOPSY

[L XCCL synth-2D]
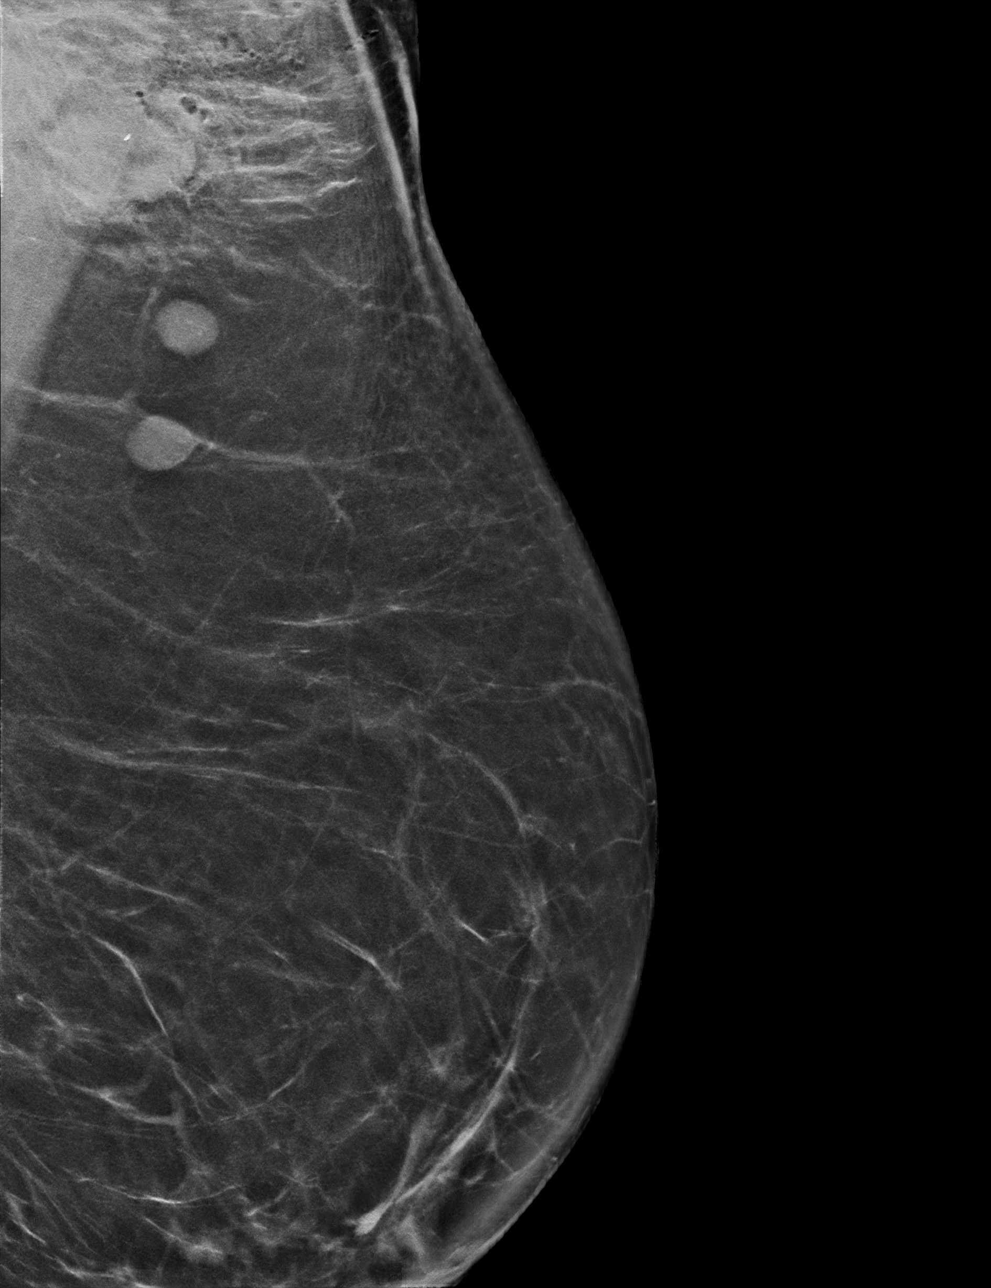

[L LM synth-2D]
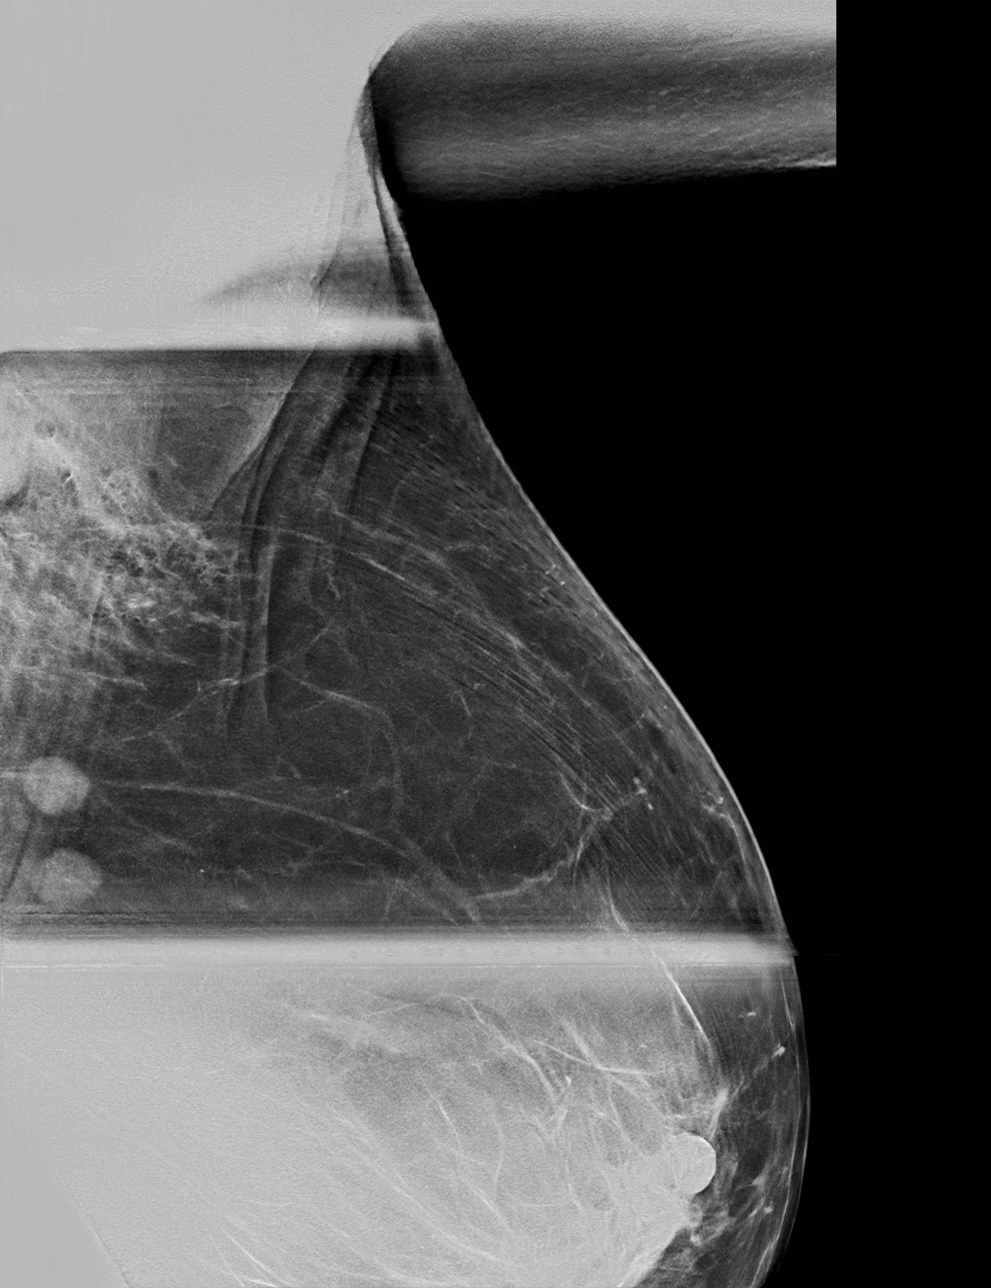

[L LM tomo · tomo slice 35/70.0]
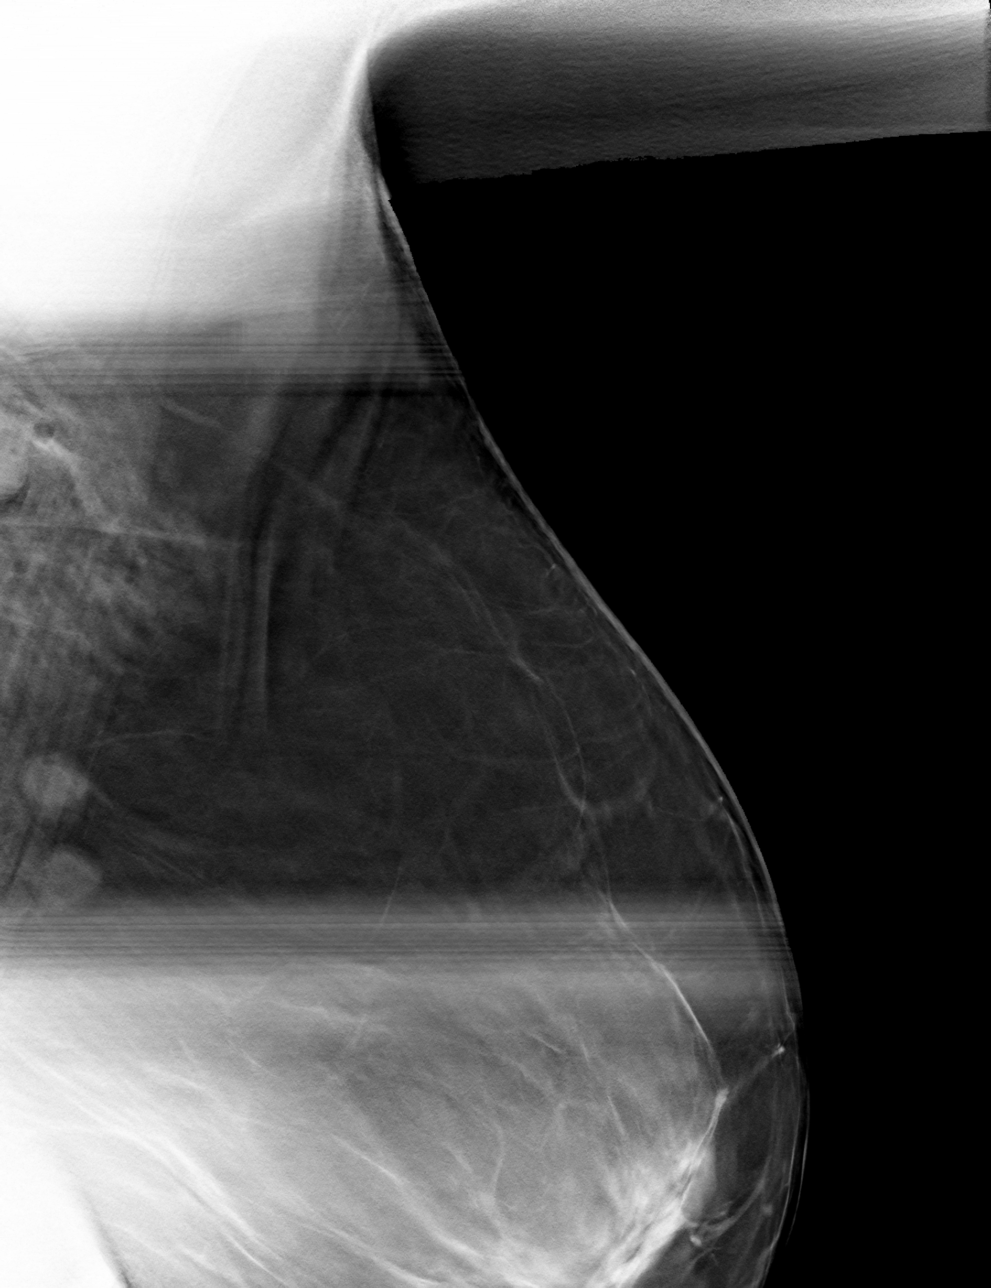

[L XCCL tomo · tomo slice 35/69.0]
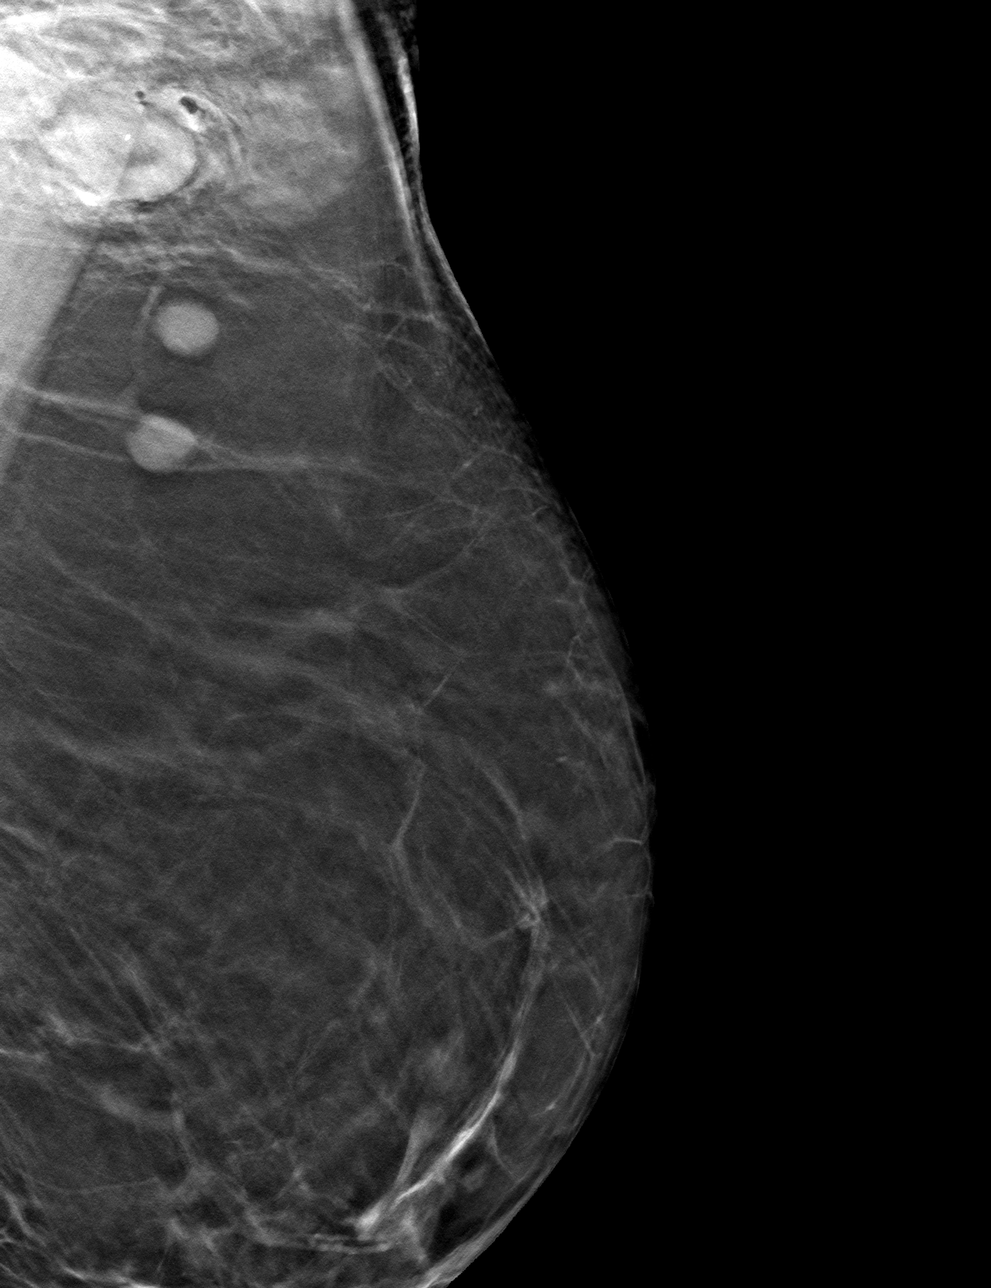

[4 of 12 positions shown; findings below may reference images not displayed]

FINDINGS: 3D Mammographic images were obtained following ultrasound guided
biopsy of a left axillary lymph node. The biopsy marking clip is the
left axilla.
IMPRESSION: Appropriate positioning of the HydroMARK 4 butterfly clip shaped
biopsy marking clip at the site of biopsy in the left axilla.

Final Assessment: Post Procedure Mammograms for Marker Placement

## 2020-09-03 ENCOUNTER — Encounter: Payer: Self-pay | Admitting: Occupational Therapy

## 2020-09-03 ENCOUNTER — Ambulatory Visit: Payer: Medicaid Other | Admitting: Occupational Therapy

## 2020-09-03 ENCOUNTER — Ambulatory Visit: Payer: Medicaid Other | Attending: Physical Medicine and Rehabilitation

## 2020-09-03 DIAGNOSIS — M25631 Stiffness of right wrist, not elsewhere classified: Secondary | ICD-10-CM

## 2020-09-03 DIAGNOSIS — R41844 Frontal lobe and executive function deficit: Secondary | ICD-10-CM | POA: Diagnosis present

## 2020-09-03 DIAGNOSIS — R4701 Aphasia: Secondary | ICD-10-CM | POA: Diagnosis present

## 2020-09-03 DIAGNOSIS — R262 Difficulty in walking, not elsewhere classified: Secondary | ICD-10-CM | POA: Insufficient documentation

## 2020-09-03 DIAGNOSIS — M25611 Stiffness of right shoulder, not elsewhere classified: Secondary | ICD-10-CM | POA: Insufficient documentation

## 2020-09-03 DIAGNOSIS — M6281 Muscle weakness (generalized): Secondary | ICD-10-CM

## 2020-09-03 DIAGNOSIS — R208 Other disturbances of skin sensation: Secondary | ICD-10-CM | POA: Insufficient documentation

## 2020-09-03 DIAGNOSIS — I69851 Hemiplegia and hemiparesis following other cerebrovascular disease affecting right dominant side: Secondary | ICD-10-CM | POA: Diagnosis present

## 2020-09-03 DIAGNOSIS — R278 Other lack of coordination: Secondary | ICD-10-CM

## 2020-09-03 DIAGNOSIS — R2689 Other abnormalities of gait and mobility: Secondary | ICD-10-CM | POA: Insufficient documentation

## 2020-09-03 DIAGNOSIS — R41842 Visuospatial deficit: Secondary | ICD-10-CM

## 2020-09-03 DIAGNOSIS — R41840 Attention and concentration deficit: Secondary | ICD-10-CM | POA: Diagnosis present

## 2020-09-03 DIAGNOSIS — R2681 Unsteadiness on feet: Secondary | ICD-10-CM | POA: Diagnosis present

## 2020-09-03 NOTE — Therapy (Signed)
[facility] [address] [place], [place], [address] Phone: [phone]   Fax:  [phone]  Physical Therapy Treatment  Patient Details  Name: Lindsey [name] MRN: [id number] Date of Birth: [birth year] Referring Provider (PT): Horton Chin, MD   Encounter Date: 09/03/2020   PT End of Session - 09/03/20 1105    Visit Number 5    Number of Visits 8    Date for PT Re-Evaluation 09/18/20   POC for 7 weeks   Authorization Type [employer] Medicaid (VL: 27 between OT/PT/ST)    PT Start Time 1101    PT Stop Time 1146    PT Time Calculation (min) 45 min    Equipment Utilized During Treatment Gait belt    Activity Tolerance Patient tolerated treatment well    Behavior During Therapy WFL for tasks assessed/performed           Past Medical History:  Diagnosis Date  . Diverticulosis   . Hypertension   . Uterine prolapse     History reviewed. No pertinent surgical history.  There were no vitals filed for this visit.   Subjective Assessment - 09/03/20 1104    Subjective Patient reports no new changes. Did get bigger size shoe for the AFO. No falls.    Patient is accompained by: Family member   Daughter   Pertinent History HTN, Alcohol Abuse    Limitations Standing;Walking    Patient Stated Goals Be able to walk    Currently in Pain? No/denies              [facility] Adult PT Treatment/Exercise - 09/03/20 1327      Transfers   Transfers Sit to Stand;Stand to Sit;Squat Pivot Transfers    Sit to Stand 4: Min assist    Sit to Stand Details Tactile cues for weight shifting;Tactile cues for placement;Verbal cues for sequencing;Verbal cues for safe use of DME/AE;Verbal cues for technique;Manual facilitation for weight shifting    Sit to Stand Details (indicate cue type and reason) continue cues for hand placement; PT placing RUE    Stand to Sit 4: Min guard    Stand to Sit Details (indicate cue type and reason) Manual  facilitation for placement;Verbal cues for sequencing    Squat Pivot Transfers 4: Min guard    Squat Pivot Transfer Details (indicate cue type and reason) completed with R AFO donned and RW with R Hand Grip Attachment.  PT providing verbal cues for improved turn and alignment prior to descent for surface.     Comments completed sit <> stand x 5 reps without RW and PT Placed in front working on improved forward lean      Ambulation/Gait   Ambulation/Gait Yes    Ambulation/Gait Assistance 4: Min assist    Ambulation/Gait Assistance Details Completed ambulation with personal R AFO and R hand grip on RW. Patient recieved new shoe to accomodate for AFO and have no reports of pain today. Completed ambulation x 106 ft, and x 60 ft. Min A required for advancement of RLE at times, intermittent standing rest breaks needed. Completed ambulation with shoe cover to promote improved advancement of RLE.     Ambulation Distance (Feet) 106 Feet   x 1, 60 x1   Assistive device Rolling walker    Gait Pattern Step-to pattern;Decreased arm swing - right;Decreased step length - left;Decreased arm swing - left;Decreased step length - right;Decreased stance time - right    Ambulation Surface Level;Indoor  Exercises   Exercises Other Exercises    Other Exercises  PT reviewed current HEP with patient and caregiver to ensure compliance and proper completion. No issues noted or reported.                 PT Short Term Goals - 09/03/20 1106      PT SHORT TERM GOAL #1   Title Patient will be independent with HEP for strengthening with caregiver assistance (ALL STGs Due:    Baseline Patient/caregiver reports no issues with HEP    Time 3    Period Weeks    Status Achieved    Target Date 08/21/20      PT SHORT TERM GOAL #2   Title Patient will demo ability to complete sit <> stand with Min A and RW to demonstrate improved functional mobility    Baseline Mod A, Min A required on 12/2    Time 3    Period  Weeks    Status Achieved      PT SHORT TERM GOAL #3   Title Patient will demo ability to complete stand pivot transfer with RW from w/c <> mat with Min A to demonstrate improved functional mobility    Baseline able to complete transfer with RW and Min A    Time 3    Period Weeks    Status Achieved             PT Long Term Goals - 07/31/20 1038      PT LONG TERM GOAL #1   Title patient will be independent with final HEP with caregiver assistance (All LTGS Due: 09/18/20)    Baseline no HEP established    Time 7    Period Weeks    Status New    Target Date 09/18/20      PT LONG TERM GOAL #2   Title Patient will demo ability to complete sit <> stand with CGA and LRAD to demonstrate improved functional mobility    Baseline Mod A    Time 7    Period Weeks    Status New      PT LONG TERM GOAL #3   Title Patient wll demo ability to complete all bed mobility with CGA to demonstrate improved funcitonal mobility and reduced caregiver assistance    Baseline TBA    Time 7    Period Weeks    Status New      PT LONG TERM GOAL #4   Title Patient will demo ability to ambulate > 20 ft with LRAD and R AFO donned with Mod A for improved household mobility    Baseline TBA    Time 7    Period Weeks    Status New                 Plan - 09/03/20 1333    Clinical Impression Statement Today's skilled PT session focused on assessment of patient's progress toward STGs. Patient able to meet all STG today demonstrating improvements with functional mobility. Continued gait training today, patient tolerating much better today with minimal pain due to having bigger shoe to accommodate for AFO. Will continue per POC, and progress toward all unmet goals.    Personal Factors and Comorbidities Comorbidity 2    Comorbidities HTN, Alcohol Abuse, Diverticulosis    Examination-Activity Limitations Bed Mobility;Bathing;Sit;Transfers;Dressing;Stairs;Stand;Locomotion Level;Toileting     Examination-Participation Restrictions Occupation;Community Activity;Driving    Stability/Clinical Decision Making Evolving/Moderate complexity    Rehab Potential Good  PT Frequency 1x / week    PT Duration --   7 weeks   PT Treatment/Interventions ADLs/Self Care Home Management;Electrical Stimulation;DME Instruction;Gait training;Stair training;Therapeutic activities;Functional mobility training;Therapeutic exercise;Balance training;Neuromuscular re-education;Patient/family education;Orthotic Fit/Training;Wheelchair mobility training;Manual techniques;Passive range of motion    PT Next Visit Plan continue gait trianing; try supine exercises for RLE; provide daughter with hope clinic information    Consulted and Agree with Plan of Care Patient;Family member/caregiver    Family Member Consulted Daughter           Patient will benefit from skilled therapeutic intervention in order to improve the following deficits and impairments:  Abnormal gait, Decreased balance, Decreased mobility, Decreased endurance, Difficulty walking, Impaired sensation, Pain, Impaired UE functional use, Decreased strength, Decreased safety awareness, Decreased knowledge of use of DME, Decreased activity tolerance, Decreased range of motion, Impaired tone, Improper body mechanics  Visit Diagnosis: Muscle weakness (generalized)  Difficulty in walking, not elsewhere classified  Other abnormalities of gait and mobility     Problem List Patient Active Problem List   Diagnosis Date Noted  . Anemia 05/31/2020  . Acute ischemic left MCA stroke (HCC) 04/30/2020  . Right hemiplegia (HCC) 04/30/2020  . CVA (cerebral vascular accident) (HCC) 04/21/2020  . Unspecified atrial fibrillation (HCC) 04/21/2020  . Essential hypertension 04/21/2020  . Alcohol abuse 04/21/2020    Tempie Donning, PT, DPT 09/03/2020, 1:36 PM  [facility] [address] [place], [place], [address] Phone: [phone]   Fax:  [phone]  Name: Lindsey [name] MRN: [id number] Date of Birth: May 14, 1956

## 2020-09-03 NOTE — Patient Instructions (Signed)
   Self Passive Range of Motion   In sitting, clasp hands together and bend down towards toes for a stretch for your shoulder. Repeat 10 times and do 2-3 times a day.

## 2020-09-03 NOTE — Therapy (Signed)
[facility] [address] [place], [place], [address] Phone: [phone]   Fax:  [phone]  Occupational Therapy Evaluation  Patient Details  Name: Lindsey [name] MRN: [id number] Date of Birth: April 27, 1956 Referring Provider (OT): Sula Soda, MD   Encounter Date: 09/03/2020   OT End of Session - 09/03/20 1122    Visit Number 1    Number of Visits 9    Date for OT Re-Evaluation 11/12/20    Authorization Type UHC - Medicaid    Authorization Time Period VL: 27 PT/OT/ST combined    OT Start Time 1015    OT Stop Time 1100    OT Time Calculation (min) 45 min    Activity Tolerance Patient tolerated treatment well    Behavior During Therapy [facility] for tasks assessed/performed;Restless           Past Medical History:  Diagnosis Date  . Diverticulosis   . Hypertension   . Uterine prolapse     History reviewed. No pertinent surgical history.  There were no vitals filed for this visit.   Subjective Assessment - 09/03/20 1132    Subjective  Pt is a 64 year old female that presents to neuro OPOT s/p Acute left MCA CVA 04/21/2020 with right hemiplegia. Pt reports no pain at rest but with PROM reports pain of 5/10 in shoulder and wrist. 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 Not Driving. Aphasia. Sensory deficits RUE. Apraxia.    Patient Stated Goals get arm working better (right)    Currently in Pain? Yes   during PROM of RUE   Pain Score 5     Pain Location Shoulder   and wrist   Pain Orientation Right    Pain Descriptors / Indicators Shooting;Sharp    Pain Type Acute pain    Pain Onset 1 to 4 weeks ago    Pain Frequency Intermittent             [facility] OT Assessment - 09/03/20 1020      Assessment   Medical Diagnosis Acute L MCA CVA    Referring  Provider (OT) Sula Soda, MD    Onset Date/Surgical Date 04/21/20    Hand Dominance Right    Prior Therapy CIR      Precautions   Precautions Fall    Precaution Comments not driving. aphasia. sensory deficits in RUE.     Other Brace/Splint no OT braces/splints      Balance Screen   Has the patient fallen in the past 6 months No   pt seeing PT     Home  Environment   Family/patient expects to be discharged to: Private residence    Living Arrangements Children    Type of Home House    Home Layout Able to live on main level with bedroom/bathroom    Bathroom Landscape architect seat;Wheelchair - Careers adviser (comment)   stair lift chair   Lives With Family   lives with daughter and daughter's children     Prior Function   Level of Independence Independent    Vocation Part time employment    Vocation Requirements [employer]    Leisure watch TV, shopping      ADL   Eating/Feeding Needs assist with cutting food      IADL  Prior Level of Function Shopping Independent    Shopping Completely unable to shop    Prior Level of Function Light Housekeeping Independent    Light Housekeeping Does not participate in any housekeeping tasks    Prior Level of Function Meal Prep Independent    Meal Prep Needs to have meals prepared and served    Prior Level of Function Best boyCommunity Mobility Independent    Community Mobility Relies on family or friends for transportation    Prior Level of Function Medication Managment Independent    Medication Management Is not capable of dispensing or managing own medication    Prior Level of Function Financial Management Independent    Financial Management Requires assistance;Dependent      Mobility   Mobility Status Needs assist    Mobility Status Comments arrived to OT eval in wheelchair      Written Expression   Dominant Hand Right    Written Experience Not tested      Vision - History   Baseline Vision Wears  glasses all the time    Additional Comments pt reports no changes in vision. Pt s daughter also reports no changes in vision that they are aware of      Vision Assessment   Comment During evaluation noted some visual field cut AND/OR inattention/neglect to right - continue to assess and address as needed      Cognition   Area of Impairment Attention;Memory;Following commands;Problem solving    Behaviors Restless;Impulsive      Sensation   Light Touch Impaired Detail    Additional Comments absent based on occluded eye test      Tone   Assessment Location Right Upper Extremity      ROM / Strength   AROM / PROM / Strength AROM;PROM;Strength      AROM   Overall AROM  Deficits    Overall AROM Comments RUE deficits. No active movement noted on evaluation - will continue assess      PROM   Overall PROM  Deficits    PROM Assessment Site Shoulder;Wrist    Right/Left Shoulder Right    Right Shoulder Flexion 60 Degrees    Right/Left Wrist Right    Right Wrist Extension 15 Degrees   radial measurement     Strength   Overall Strength Deficits    Overall Strength Comments LUE WFL  RUE 0/5 on eval      Hand Function   Right Hand Gross Grasp Impaired    Left Hand Gross Grasp Functional      RUE Tone   RUE Tone Hypertonic;Modified Ashworth      RUE Tone   Modified Ashworth Scale for Grading Hypertonia RUE More marked increase in muscle tone through most of the ROM, but affected part(s) easily moved      RLE Tone   RLE Tone --      RLE Tone   Modified Ashworth Scale for Grading Hypertonia RLE --                             OT Short Term Goals - 09/03/20 1724      OT SHORT TERM GOAL #1   Title Pt and caregiver will be independent with HEP12/30/21    Baseline not issued yet    Time 4    Period Weeks    Status New    Target Date 10/01/20      OT SHORT TERM GOAL #  2   Title Pt and caregiver will verbalize understanding of sensory strategies for safety  regarding RUE with sensory deficits.    Baseline not reviewed/issued    Time 4    Period Weeks    Status New      OT SHORT TERM GOAL #3   Title Pt will complete table top scanning task with supervision and 75% accuracy.    Baseline right inatention and/or field deficits noted at eval    Time 4    Period Weeks    Status New      OT SHORT TERM GOAL #4   Title Pt will complete light snack or beverage prep with set up assistance of materials that are not in reach with supervision and adapted strategies or equipment PRN    Baseline total A    Time 4    Period Weeks    Status New      OT SHORT TERM GOAL #5   Title Pt will don and doff shoes with supervision for increasing independence with basic ADLs    Baseline max A    Time 4    Period Weeks    Status New      OT SHORT TERM GOAL #6   Title Pt and caregiver will verbalize and demonstrate understanding of splints and/or orthoses PRN for right hemiplegia    Baseline none issued    Time 4    Period Weeks    Status New             OT Long Term Goals - 09/03/20 1757      OT LONG TERM GOAL #1   Title Pt and caregiver will be independent with updated HEP 10/29/20    Baseline not issued    Time 8    Period Weeks    Status New    Target Date 10/29/20      OT LONG TERM GOAL #2   Title Pt will perform UB dressing with min A for increase in independence with ADLs    Baseline max A    Time 8    Period Weeks    Status New      OT LONG TERM GOAL #3   Title Pt will perform table top scanning task with 100% accuracy    Baseline inattention to right    Time 8    Period Weeks    Status New      OT LONG TERM GOAL #4   Title Pt will achieve PROM of RUE shoulder flexion of 75 degrees with pain no greater than 4/10    Baseline 60 degrees pain 5/10    Time 8    Period Weeks    Status New      OT LONG TERM GOAL #5   Title Pt will report assisting with at least one home management or cooking activity per day in order to  increase independence and decrease caregiver burden    Baseline not currently assisting    Time 8    Period Weeks    Status New                 Plan - 09/03/20 1128    Clinical Impression Statement Pt is a 64 year old female that presents to neuro OPOT s/p Left MCA CVA on 04/21/20. Pt has PMH significant for HTN, Diverticulosis, Alcohol Abuse. Pt was independent prior to CVA and working part time at [employer]. Pt is a right handed dominant female  that presents with hypertonicity in RUE with impaired ROM and strength, visualperceptual skills and sensation. Pt has decreased independence and increased caregiver burden since time of CVA. Skilled occupational therapy is recommended to target listed areas of deficit and increase independence and decrease caregiver burden.    OT Occupational Profile and History Problem Focused Assessment - Including review of records relating to presenting problem    Occupational performance deficits (Please refer to evaluation for details): ADL's;IADL's;Leisure    Body Structure / Function / Physical Skills ADL;Decreased knowledge of use of DME;Strength;Gait;GMC;Tone;Pain;Dexterity;Balance;UE functional use;Proprioception;ROM;IADL;Vision;Sensation;Coordination;FMC    Cognitive Skills Attention;Energy/Drive;Perception;Understand;Sequencing;Problem Solve    Rehab Potential Good    Clinical Decision Making Limited treatment options, no task modification necessary    Comorbidities Affecting Occupational Performance: None    Modification or Assistance to Complete Evaluation  No modification of tasks or assist necessary to complete eval    OT Frequency 1x / week    OT Duration 8 weeks   +eval   OT Treatment/Interventions Self-care/ADL training;Moist Heat;DME and/or AE instruction;Balance training;Aquatic Therapy;Therapeutic activities;Cognitive remediation/compensation;Therapeutic exercise;Visual/perceptual remediation/compensation;Passive range of  motion;Neuromuscular education;Functional Mobility Training;Cryotherapy;Electrical Stimulation;Energy conservation;Manual Therapy;Patient/family education    Plan review HEP for self-PROM, start supine self PROM, continue to look at visual field    OT Home Exercise Plan Self-PROM seated shoulder    Recommended Other Services Pt receiving ST and PT    Consulted and Agree with Plan of Care Patient;Family member/caregiver    Family Member Consulted daughter           Patient will benefit from skilled therapeutic intervention in order to improve the following deficits and impairments:   Body Structure / Function / Physical Skills: ADL, Decreased knowledge of use of DME, Strength, Gait, GMC, Tone, Pain, Dexterity, Balance, UE functional use, Proprioception, ROM, IADL, Vision, Sensation, Coordination, [facility] Cognitive Skills: Attention, Energy/Drive, Perception, Understand, Sequencing, Problem Solve     Visit Diagnosis: 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) - Plan: Ot plan of care cert/re-cert  Muscle weakness (generalized) - Plan: Ot plan of care cert/re-cert  Visuospatial deficit - Plan: Ot plan of care cert/re-cert  Unsteadiness on feet - Plan: Ot plan of care cert/re-cert  Attention and concentration deficit - Plan: Ot plan of care cert/re-cert  Frontal lobe and executive function deficit - Plan: Ot plan of care cert/re-cert  Other disturbances of skin sensation - Plan: Ot plan of care cert/re-cert  Other lack of coordination - Plan: Ot plan of care cert/re-cert  Stiffness of right wrist, not elsewhere classified - Plan: Ot plan of care cert/re-cert  Stiffness of right shoulder, not elsewhere classified - Plan: Ot plan of care cert/re-cert    Problem List Patient Active Problem List   Diagnosis Date Noted  . Anemia 05/31/2020  . Acute ischemic left MCA stroke (HCC) 04/30/2020  . Right hemiplegia (HCC) 04/30/2020    . CVA (cerebral vascular accident) (HCC) 04/21/2020  . Unspecified atrial fibrillation (HCC) 04/21/2020  . Essential hypertension 04/21/2020  . Alcohol abuse 04/21/2020    Junious Dresser MOT, OTR/L  09/03/2020, 6:07 PM  [facility] [address] [place], [place], [address] Phone: [phone]   Fax:  [phone]  Name: Lindsey [name] MRN: [id number] Date of Birth: 02-12-1956

## 2020-09-07 ENCOUNTER — Ambulatory Visit: Payer: Medicaid Other

## 2020-09-07 ENCOUNTER — Encounter: Payer: Self-pay | Admitting: Oncology

## 2020-09-07 LAB — SURGICAL PATHOLOGY

## 2020-09-10 ENCOUNTER — Ambulatory Visit: Payer: Medicaid Other

## 2020-09-10 ENCOUNTER — Ambulatory Visit: Payer: Medicaid Other | Admitting: Physical Therapy

## 2020-09-10 ENCOUNTER — Ambulatory Visit: Payer: Medicaid Other | Admitting: Occupational Therapy

## 2020-09-10 ENCOUNTER — Other Ambulatory Visit: Payer: Self-pay

## 2020-09-10 ENCOUNTER — Encounter: Payer: Self-pay | Admitting: Occupational Therapy

## 2020-09-10 ENCOUNTER — Encounter: Payer: Self-pay | Admitting: Oncology

## 2020-09-10 DIAGNOSIS — R2681 Unsteadiness on feet: Secondary | ICD-10-CM

## 2020-09-10 DIAGNOSIS — R41840 Attention and concentration deficit: Secondary | ICD-10-CM

## 2020-09-10 DIAGNOSIS — I69851 Hemiplegia and hemiparesis following other cerebrovascular disease affecting right dominant side: Secondary | ICD-10-CM

## 2020-09-10 DIAGNOSIS — M6281 Muscle weakness (generalized): Secondary | ICD-10-CM

## 2020-09-10 DIAGNOSIS — R278 Other lack of coordination: Secondary | ICD-10-CM

## 2020-09-10 DIAGNOSIS — R262 Difficulty in walking, not elsewhere classified: Secondary | ICD-10-CM

## 2020-09-10 NOTE — Therapy (Signed)
[facility] [address] [place], [place], [address] Phone: [phone]   Fax:  [phone]  Physical Therapy Treatment  Patient Details  Name: Lindsey [name] MRN: [id number] Date of Birth: 1955-11-26 Referring Provider (PT): Horton Chin, MD   Encounter Date: 09/10/2020   PT End of Session - 09/10/20 1803    Visit Number 6    Number of Visits 8    Date for PT Re-Evaluation 09/18/20   POC for 7 weeks   Authorization Type U[employer] Medicaid (VL: 27 between OT/PT/ST)    PT Start Time 1708    PT Stop Time 1750    PT Time Calculation (min) 42 min    Equipment Utilized During Treatment Gait belt    Activity Tolerance Patient tolerated treatment well    Behavior During Therapy WFL for tasks assessed/performed           Past Medical History:  Diagnosis Date  . Diverticulosis   . Hypertension   . Uterine prolapse     No past surgical history on file.  There were no vitals filed for this visit.   Subjective Assessment - 09/10/20 1710    Subjective Pt reports nothing new or different. No falls noted.    Patient is accompained by: Family member   Daughter   Pertinent History HTN, Alcohol Abuse    Limitations Standing;Walking    Patient Stated Goals Be able to walk    Currently in Pain? Yes    Pain Score 5     Pain Location Hand    Pain Orientation Right                             [facility] Adult PT Treatment/Exercise - 09/10/20 1805      Transfers   Transfers Sit to Stand;Stand to Sit    Sit to Stand 4: Min assist    Sit to Stand Details Tactile cues for weight shifting;Tactile cues for placement;Verbal cues for sequencing;Verbal cues for safe use of DME/AE;Verbal cues for technique;Manual facilitation for weight shifting;Manual facilitation for placement    Sit to Stand Details (indicate cue type and reason) assist for R UE placement; tactile, verbal & visual cues for weight shift towards right     Stand to Sit 4: Min assist    Stand to Sit Details (indicate cue type and reason) Manual facilitation for placement;Verbal cues for sequencing    Stand to Sit Details assist for controlled descent whilst maintaining weightbearing on R LE    Number of Reps 10 reps      Ambulation/Gait   Ambulation/Gait Yes    Ambulation/Gait Assistance 4: Min assist    Ambulation/Gait Assistance Details R AFO and R hand grip on RW. Cues to decrease R foot ER, v/cs and manual assist to weightshift on R LE for improved L LE clearance/step length. Min A at times to assist with R foot placement/adjustment. Chair follow for safety. 2 sitting rest breaks.    Ambulation Distance (Feet) 115 Feet    Assistive device Rolling walker    Gait Pattern Step-through pattern;Decreased stance time - right;Decreased step length - left;Decreased dorsiflexion - right;Decreased weight shift to right   cues for step through pattern; assist to keep R knee from hyperextending   Ambulation Surface Level;Indoor            Seated:  R Knee flexion x20 with visual cueing and neurofacilitation -- R foot hanging  to allow for easier ROM/decreased friction  R hip clamshell x20 with visual cueing and neurofacilitation      PT Short Term Goals - 09/03/20 1106      PT SHORT TERM GOAL #1   Title Patient will be independent with HEP for strengthening with caregiver assistance (ALL STGs Due:    Baseline Patient/caregiver reports no issues with HEP    Time 3    Period Weeks    Status Achieved    Target Date 08/21/20      PT SHORT TERM GOAL #2   Title Patient will demo ability to complete sit <> stand with Min A and RW to demonstrate improved functional mobility    Baseline Mod A, Min A required on 12/2    Time 3    Period Weeks    Status Achieved      PT SHORT TERM GOAL #3   Title Patient will demo ability to complete stand pivot transfer with RW from w/c <> mat with Min A to demonstrate improved functional mobility     Baseline able to complete transfer with RW and Min A    Time 3    Period Weeks    Status Achieved             PT Long Term Goals - 07/31/20 1038      PT LONG TERM GOAL #1   Title patient will be independent with final HEP with caregiver assistance (All LTGS Due: 09/18/20)    Baseline no HEP established    Time 7    Period Weeks    Status New    Target Date 09/18/20      PT LONG TERM GOAL #2   Title Patient will demo ability to complete sit <> stand with CGA and LRAD to demonstrate improved functional mobility    Baseline Mod A    Time 7    Period Weeks    Status New      PT LONG TERM GOAL #3   Title Patient wll demo ability to complete all bed mobility with CGA to demonstrate improved funcitonal mobility and reduced caregiver assistance    Baseline TBA    Time 7    Period Weeks    Status New      PT LONG TERM GOAL #4   Title Patient will demo ability to ambulate > 20 ft with LRAD and R AFO donned with Mod A for improved household mobility    Baseline TBA    Time 7    Period Weeks    Status New                 Plan - 09/10/20 1800    Clinical Impression Statement Session focused on continuing to improve R LE weight bearing and weight shift with sit<>stand and ambulation. Pt able to amb increased distance with improving step length. Rest of session focused on increasing pt activation for R hip abductors and hamstring in sitting. Difficulty performing knee flexion due to pt's tone.    Personal Factors and Comorbidities Comorbidity 2    Comorbidities HTN, Alcohol Abuse, Diverticulosis    Examination-Activity Limitations Bed Mobility;Bathing;Sit;Transfers;Dressing;Stairs;Stand;Locomotion Level;Toileting    Examination-Participation Restrictions Occupation;Community Activity;Driving    Stability/Clinical Decision Making Evolving/Moderate complexity    Rehab Potential Good    PT Frequency 1x / week    PT Duration --   7 weeks   PT Treatment/Interventions ADLs/Self  Care Home Management;Electrical Stimulation;DME Instruction;Gait training;Stair training;Therapeutic activities;Functional mobility training;Therapeutic  exercise;Balance training;Neuromuscular re-education;Patient/family education;Orthotic Fit/Training;Wheelchair mobility training;Manual techniques;Passive range of motion    PT Next Visit Plan continue gait trianing; try supine exercises for RLE; provide daughter with [facility] information    PT Home Exercise Plan Access Code: [email or web address]    Consulted and Agree with Plan of Care Patient;Family member/caregiver    Family Member Consulted Daughter           Patient will benefit from skilled therapeutic intervention in order to improve the following deficits and impairments:  Abnormal gait,Decreased balance,Decreased mobility,Decreased endurance,Difficulty walking,Impaired sensation,Pain,Impaired UE functional use,Decreased strength,Decreased safety awareness,Decreased knowledge of use of DME,Decreased activity tolerance,Decreased range of motion,Impaired tone,Improper body mechanics  Visit Diagnosis: 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 Anemia 05/31/2020  . Acute ischemic left MCA stroke (HCC) 04/30/2020  . Right hemiplegia (HCC) 04/30/2020  . CVA (cerebral vascular accident) (HCC) 04/21/2020  . Unspecified atrial fibrillation (HCC) 04/21/2020  . Essential hypertension 04/21/2020  . Alcohol abuse 04/21/2020    [facility] Ma L Julianah Marciel PT, DPT 09/10/2020, 6:14 PM  [facility] [address] [place], [place], [address] Phone: [phone]   Fax:  [phone]  Name: Lindsey [name] MRN: [id number] Date of  Birth: 1956/07/26

## 2020-09-10 NOTE — Patient Instructions (Addendum)
° °  Self Passive Range of Motion  ° °While lying down, clasp hands together and reach towards headboard for a stretch for your shoulder. Repeat 10 times and do 2-3 times a day. ° °While lying down, clasp hands together and reach side to side for a stretch for your shoulder. Repeat 10 times and do 2-3 times a day. ° °While lying down, clasp hands together and reach up towards the ceiling for a stretch for your shoulder. Repeat 10 times and do 2-3 times a day.  °

## 2020-09-10 NOTE — Therapy (Signed)
[facility] [address] [place], [place], [address] Phone: [phone]   Fax:  [phone]  Occupational Therapy Treatment  Patient Details  Name: Lindsey [name] MRN: [id number] Date of Birth: 1956/05/26 Referring Provider (OT): Sula Soda, MD   Encounter Date: 09/10/2020   OT End of Session - 09/10/20 1618    Visit Number 2    Number of Visits 9    Date for OT Re-Evaluation 11/12/20    Authorization Type UHC - Medicaid    Authorization Time Period VL: 27 PT/OT/ST combined    OT Start Time 1617    OT Stop Time 1700    OT Time Calculation (min) 43 min    Activity Tolerance Patient tolerated treatment well    Behavior During Therapy [facility] for tasks assessed/performed;Restless           Past Medical History:  Diagnosis Date  . Diverticulosis   . Hypertension   . Uterine prolapse     History reviewed. No pertinent surgical history.  There were no vitals filed for this visit.   Subjective Assessment - 09/10/20 1617    Subjective  Pt denies pain.    Patient is accompanied by: Family member   daughter   Pertinent History PMH significant for HTN, EtOH abuse, diverticulosis, CVA    Limitations Fall Risk. Not Driving. Aphasia. Sensory deficits RUE. Apraxia.    Patient Stated Goals get arm working better (right)    Currently in Pain? No/denies    Pain Onset 1 to 4 weeks ago                        OT Treatments/Exercises (OP) - 09/10/20 1705      Bed Mobility   Bed Mobility Sit to Supine;Supine to Sit;Rolling Right;Sitting - Scoot to Edge of Bed    Rolling Right Minimal Assistance - Patient > 75%    Supine to Sit Moderate Assistance - Patient 50-74%    Sitting - Scoot to Edge of Bed Minimal Assistance - Patient > 75%    Sit to Supine Minimal Assistance - Patient > 75%      Transfers   Comments Stand Pivot with Min A                    OT Short Term Goals - 09/03/20 1724       OT SHORT TERM GOAL #1   Title Pt and caregiver will be independent with HEP12/30/21    Baseline not issued yet    Time 4    Period Weeks    Status New    Target Date 10/01/20      OT SHORT TERM GOAL #2   Title Pt and caregiver will verbalize understanding of sensory strategies for safety regarding RUE with sensory deficits.    Baseline not reviewed/issued    Time 4    Period Weeks    Status New      OT SHORT TERM GOAL #3   Title Pt will complete table top scanning task with supervision and 75% accuracy.    Baseline right inatention and/or field deficits noted at eval    Time 4    Period Weeks    Status New      OT SHORT TERM GOAL #4   Title Pt will complete light snack or beverage prep with set up assistance of materials that are not in reach with supervision and adapted strategies or equipment PRN  Baseline total A    Time 4    Period Weeks    Status New      OT SHORT TERM GOAL #5   Title Pt will don and doff shoes with supervision for increasing independence with basic ADLs    Baseline max A    Time 4    Period Weeks    Status New      OT SHORT TERM GOAL #6   Title Pt and caregiver will verbalize and demonstrate understanding of splints and/or orthoses PRN for right hemiplegia    Baseline none issued    Time 4    Period Weeks    Status New             OT Long Term Goals - 09/03/20 1757      OT LONG TERM GOAL #1   Title Pt and caregiver will be independent with updated HEP 10/29/20    Baseline not issued    Time 8    Period Weeks    Status New    Target Date 10/29/20      OT LONG TERM GOAL #2   Title Pt will perform UB dressing with min A for increase in independence with ADLs    Baseline max A    Time 8    Period Weeks    Status New      OT LONG TERM GOAL #3   Title Pt will perform table top scanning task with 100% accuracy    Baseline inattention to right    Time 8    Period Weeks    Status New      OT LONG TERM GOAL #4   Title Pt will  achieve PROM of RUE shoulder flexion of 75 degrees with pain no greater than 4/10    Baseline 60 degrees pain 5/10    Time 8    Period Weeks    Status New      OT LONG TERM GOAL #5   Title Pt will report assisting with at least one home management or cooking activity per day in order to increase independence and decrease caregiver burden    Baseline not currently assisting    Time 8    Period Weeks    Status New                 Plan - 09/10/20 1710    Clinical Impression Statement Pt progressing towards goals. Pt demonstrates slight increase in activation in RUE since evaluation although limited. Pt's daughter present for sessions and active participation    OT Occupational Profile and History Problem Focused Assessment - Including review of records relating to presenting problem    Occupational performance deficits (Please refer to evaluation for details): ADL's;IADL's;Leisure    Body Structure / Function / Physical Skills ADL;Decreased knowledge of use of DME;Strength;Gait;GMC;Tone;Pain;Dexterity;Balance;UE functional use;Proprioception;ROM;IADL;Vision;Sensation;Coordination;FMC    Cognitive Skills Attention;Energy/Drive;Perception;Understand;Sequencing;Problem Solve    Rehab Potential Good    Clinical Decision Making Limited treatment options, no task modification necessary    Comorbidities Affecting Occupational Performance: None    Modification or Assistance to Complete Evaluation  No modification of tasks or assist necessary to complete eval    OT Frequency 1x / week    OT Duration 8 weeks   +eval   OT Treatment/Interventions Self-care/ADL training;Moist Heat;DME and/or AE instruction;Balance training;Aquatic Therapy;Therapeutic activities;Cognitive remediation/compensation;Therapeutic exercise;Visual/perceptual remediation/compensation;Passive range of motion;Neuromuscular education;Functional Mobility Training;Cryotherapy;Electrical Stimulation;Energy conservation;Manual  Therapy;Patient/family education    Plan continue to look at visual  field. review HEP    OT Home Exercise Plan Self-PROM seated shoulder    Recommended Other Services Pt receiving ST and PT    Consulted and Agree with Plan of Care Patient;Family member/caregiver    Family Member Consulted daughter           Patient will benefit from skilled therapeutic intervention in order to improve the following deficits and impairments:   Body Structure / Function / Physical Skills: ADL,Decreased knowledge of use of DME,Strength,Gait,GMC,Tone,Pain,Dexterity,Balance,UE functional use,Proprioception,ROM,IADL,Vision,Sensation,Coordination,FMC Cognitive Skills: Attention,Energy/Drive,Perception,Understand,Sequencing,Problem Solve     Visit Diagnosis: 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 Anemia 05/31/2020  . Acute ischemic left MCA stroke (HCC) 04/30/2020  . Right hemiplegia (HCC) 04/30/2020  . CVA (cerebral vascular accident) (HCC) 04/21/2020  . Unspecified atrial fibrillation (HCC) 04/21/2020  . Essential hypertension 04/21/2020  . Alcohol abuse 04/21/2020    Junious Dresser MOT, OTR/L  09/10/2020, 5:11 PM  [place] [address] [place], [place], [address] Phone: [phone]   Fax:  [phone]  Name: Lindsey [name] MRN: [id number] Date of Birth: Sep 16, 1956

## 2020-09-11 ENCOUNTER — Encounter: Payer: Self-pay | Admitting: Speech Pathology

## 2020-09-11 ENCOUNTER — Ambulatory Visit: Payer: Medicaid Other | Admitting: Speech Pathology

## 2020-09-11 DIAGNOSIS — M6281 Muscle weakness (generalized): Secondary | ICD-10-CM | POA: Diagnosis not present

## 2020-09-11 DIAGNOSIS — R4701 Aphasia: Secondary | ICD-10-CM

## 2020-09-11 NOTE — Patient Instructions (Signed)
   Use pictures and writing to say and copy family and pet names  Use writing to offer choices or to help Tamanika know the topic - just use 2-3 words, not too long of a sentence  [facility] by [facility] [phone]   1st and 3rd Tuesdays 4:30 are the Zoom meetings  [facility] [employer] [facility]  Make a list of people you talk to daily:   Weekly:   Monthly:

## 2020-09-11 NOTE — Therapy (Signed)
[facility] [address] [place], [place], [address] Phone: [phone]   Fax:  [phone]  Speech Language Pathology Treatment  Patient Details  Name: Lindsey [name] MRN: [id number] Date of Birth: 10-16-1955 Referring Provider (SLP): Dr. Sula Soda   Encounter Date: 09/11/2020   End of Session - 09/11/20 1339    Visit Number 2    Number of Visits 25    Date for SLP Re-Evaluation 11/13/20    Authorization Type UHC medicaid    SLP Start Time 1231    SLP Stop Time  1319    SLP Time Calculation (min) 48 min    Activity Tolerance Patient tolerated treatment well           Past Medical History:  Diagnosis Date  . Diverticulosis   . Hypertension   . Uterine prolapse     History reviewed. No pertinent surgical history.  There were no vitals filed for this visit.   Subjective Assessment - 09/11/20 1240    Subjective "OK"    Patient is accompained by: Family member   [name], daughter   Currently in Pain? Yes    Pain Score 6     Pain Location Hand    Pain Orientation Right    Pain Descriptors / Indicators Shooting;Sharp    Pain Type Chronic pain    Pain Onset More than a month ago                 ADULT SLP TREATMENT - 09/11/20 1242      General Information   Behavior/Cognition Alert;Cooperative;Pleasant mood      Treatment Provided   Treatment provided Cognitive-Linquistic      Cognitive-Linquistic Treatment   Treatment focused on Aphasia;Patient/family/caregiver education    Skilled Treatment Targeted naming family names. Irelynn required written cues and repetition to verbalize family names. She required max A to copy 2 names with perseveration. In discussion about family ages, occupations etc, Clyde answered  verball presented questions re: family using written choices f: 2-3 with 75% accuracy. Demonstrated and instructed daughter to use 2-3 written words to give pt choices to improve  comprehension, as well as to write 2-3 key topic words down to A Zaray in following conversation. In picture naming taks, Colleeg required sentence completion and repetition to name simple objects. Perseveration also required usual max A to ID and correct. Provided information re: [employer] app. Algie became tearful towards end of session. We ended with idom completion to provide success and encouragement. Pt 80% accurate with usual min A.      Assessment / Recommendations / Plan   Plan Continue with current plan of care      Progression Toward Goals   Progression toward goals Progressing toward goals            SLP Education - 09/11/20 1336    Education Details [employer] aphasia group, written choices at word level, Talk Path    Person(s) Educated Patient;Child(ren)    Methods Explanation;Demonstration;Verbal cues;Handout    Comprehension Verbalized understanding;Verbal cues required;Need further instruction            SLP Short Term Goals - 09/11/20 1338      SLP SHORT TERM GOAL #1   Title Family will use written choices of 2 or 3 to ID pt's wants/needs with occasional min A over 2 weeks outsdie of therapy    Baseline No written cues or choices being used    Time 4  Period Weeks    Status On-going      SLP SHORT TERM GOAL #2   Title Pt will use multimodal communication/AAC to communicate preferences for meal/snack 3x over 2 weeks with occasional min to mod A from family    Baseline no alternative communication strategies in place    Time 4    Period Weeks    Status On-going      SLP SHORT TERM GOAL #3   Title Pt will use multimodal/AAC to ID family members she wants to communicate with or about with occasional min to mod A from family.    Time 4    Period Weeks    Status On-going      SLP SHORT TERM GOAL #4   Title Pt will name 3 items in simple personally relevant category using multimodal communication with occasional min A    Baseline no items in simple category     Time 6    Period Weeks    Status On-going      SLP SHORT TERM GOAL #5   Title Pt will ID 3 paraphasias with usual min to mod A over 4 sessions    Baseline no awareness of paraphasias or perseverations    Time 6    Period Weeks    Status On-going            SLP Long Term Goals - 09/11/20 1338      SLP LONG TERM GOAL #1   Title Pt will use multimodal communication to answer 5 preferences or answer 5 questions accurately at thte word level with min A from caregivers /ST over 3 sessions    Baseline other than yes/no, pt is not indicating preferences or answering basic questions accurately    Time 12    Period Weeks    Status On-going      SLP LONG TERM GOAL #2   Title Pt will ID 5 paraphasias in a session with usual min A and self correct with frequent mod A in naming task or conversation over 2 sessions    Baseline pt unaware of paraphasias and perseverations    Time 12    Period Weeks    Status On-going      SLP LONG TERM GOAL #3   Title Caregivers will use written or photo cues at 2-3 word level to facilitate 3 turns in a conversation with pt and provide assistance in pt following a conversation topic with occasional min A over 2 sessions.    Baseline no multimodal supports in place for conversation success    Time 12    Period Weeks    Status On-going      SLP LONG TERM GOAL #4   Title Pt will name 5 items in personally relevant category using multmodal communciation with occasional min A    Baseline no items in simple category    Time 12    Period Weeks    Status On-going      SLP LONG TERM GOAL #5   Title Pt will attend aphasia conversation group (pending available technology) and family will ID 3 online/community support for persons with aphasia and caregivers    Baseline no community support identified    Time 38    Period Weeks    Status On-going            Plan - 09/11/20 1337    Clinical Impression Statement Mikeya Tomasetti is referred for outpt ST due  to aphasia s/p  CVA 04/21/20. She was hospitalized 04/21/20 through 05/28/20 including stay on CIR.Prio to this pt was independent and working full time in food service.  Sherie is accompanied by her daughter, [name]. Today, she presents with severe non fluent aphasia characterized by perserveration and neologisms. Spontaneous speech and picture description with marked word finding difficulty and prominent paraphasias, however some grammar intact. Asmi also answered 1 word personal questions with paraphasia/neologism without awareness. Her daughter consistently re-answered personal questions. Aprile did not name any items in simple category. In confronation naming, she named 8/20 basic objects, with phonemic and semantic cues (she named 1/20 independently. Phonemic, semantic and perseveration noted on basic naming. (bean/ball, fork/knife; perseveration "cupee" for 5/20) without awareness. Auditory comprehension is intact to basic personal and evnironmental yes/no questions and to simple object ID. Normagene follows simple 1 step commands and requires repetition for 2 step commands. In conversation, Shikara benefitted from poiniting to a choice of 2 words, as well as gestures and slow rate to improve comprehension.,At this time, [name] is communicating with her mother using "broad yes/no questions and then narrowing down the yes/no questions" They are not using mulitmodal communciation or external communication supports. Rakeb became tearful during evaluation due to frustration communicating. I recommend skilled ST to maximize language as well as train pt and caregiver in compensatory strategies to improve efficiency of communciation to improve QOL and reduce caregiver burden.    Speech Therapy Frequency 2x / week    Duration 12 weeks    Treatment/Interventions Compensatory strategies;Patient/family education;Functional tasks;Cueing hierarchy;Environmental controls;Cognitive reorganization;Multimodal  communcation approach;Language facilitation;Compensatory techniques;Internal/external aids;SLP instruction and feedback;Other (comment)    Potential to Achieve Goals Good    Potential Considerations Severity of impairments           Patient will benefit from skilled therapeutic intervention in order to improve the following deficits and impairments:   Aphasia    Problem List Patient Active Problem List   Diagnosis Date Noted  . Anemia 05/31/2020  . Acute ischemic left MCA stroke (HCC) 04/30/2020  . Right hemiplegia (HCC) 04/30/2020  . CVA (cerebral vascular accident) (HCC) 04/21/2020  . Unspecified atrial fibrillation (HCC) 04/21/2020  . Essential hypertension 04/21/2020  . Alcohol abuse 04/21/2020    Maziah Smola, Radene Journey MS, CCC-SLP 09/11/2020, 1:40 PM  [facility] [address] [place], [place], [address] Phone: [phone]   Fax:  [phone]   Name: Adalynn Corne MRN: [id number] Date of Birth: 03-08-1956

## 2020-09-14 DIAGNOSIS — G464 Cerebellar stroke syndrome: Secondary | ICD-10-CM | POA: Diagnosis not present

## 2020-09-16 ENCOUNTER — Encounter: Payer: Self-pay | Admitting: *Deleted

## 2020-09-16 NOTE — Progress Notes (Signed)
Patient with benign biopsy.  She is to follow up in year with next screening mammogram.

## 2020-09-18 ENCOUNTER — Ambulatory Visit: Payer: Medicaid Other

## 2020-09-18 ENCOUNTER — Encounter: Payer: Self-pay | Admitting: Occupational Therapy

## 2020-09-18 ENCOUNTER — Ambulatory Visit: Payer: Medicaid Other | Admitting: Occupational Therapy

## 2020-09-18 ENCOUNTER — Other Ambulatory Visit: Payer: Self-pay

## 2020-09-18 DIAGNOSIS — M6281 Muscle weakness (generalized): Secondary | ICD-10-CM

## 2020-09-18 DIAGNOSIS — R4701 Aphasia: Secondary | ICD-10-CM

## 2020-09-18 DIAGNOSIS — R278 Other lack of coordination: Secondary | ICD-10-CM

## 2020-09-18 DIAGNOSIS — M25631 Stiffness of right wrist, not elsewhere classified: Secondary | ICD-10-CM

## 2020-09-18 DIAGNOSIS — I69851 Hemiplegia and hemiparesis following other cerebrovascular disease affecting right dominant side: Secondary | ICD-10-CM

## 2020-09-18 DIAGNOSIS — R262 Difficulty in walking, not elsewhere classified: Secondary | ICD-10-CM

## 2020-09-18 DIAGNOSIS — R2681 Unsteadiness on feet: Secondary | ICD-10-CM

## 2020-09-18 DIAGNOSIS — R41842 Visuospatial deficit: Secondary | ICD-10-CM

## 2020-09-18 DIAGNOSIS — R41844 Frontal lobe and executive function deficit: Secondary | ICD-10-CM

## 2020-09-18 NOTE — Therapy (Signed)
[facility] [address] [place], [place], [address] Phone: [phone]   Fax:  [phone]  Physical Therapy Treatment/Re-Certification  Patient Details  Name: Lindsey [name] MRN: [id number] Date of Birth: 1955/11/17 Referring Provider (PT): Izora Ribas, MD   Encounter Date: 09/18/2020   PT End of Session - 09/18/20 1022    Visit Number 7    Number of Visits 17    Date for PT Re-Evaluation --   POC for 10 weeks total   Authorization Type UHC Medicaid (VL: 27 between OT/PT/ST)    PT Start Time 1019   patient finishing up with Speech   PT Stop Time 1059    PT Time Calculation (min) 40 min    Equipment Utilized During Treatment Gait belt    Activity Tolerance Patient tolerated treatment well    Behavior During Therapy WFL for tasks assessed/performed           Past Medical History:  Diagnosis Date  . Diverticulosis   . Hypertension   . Uterine prolapse     No past surgical history on file.  There were no vitals filed for this visit.   Subjective Assessment - 09/18/20 1022    Subjective No new changes or fall to report. No pain.    Patient is accompained by: Family member   Daughter   Pertinent History HTN, Alcohol Abuse    Limitations Standing;Walking    Patient Stated Goals Be able to walk    Currently in Pain? No/denies              [facility] Adult PT Treatment/Exercise - 09/18/20 0001      Transfers   Transfers Sit to Stand;Stand to Sit    Sit to Stand 4: Min assist    Sit to Stand Details Tactile cues for weight shifting;Tactile cues for placement;Verbal cues for sequencing;Verbal cues for safe use of DME/AE;Verbal cues for technique;Manual facilitation for weight shifting;Manual facilitation for placement    Sit to Stand Details (indicate cue type and reason) continue to require assistance with RUE placement, vebral cues for for improved forward lean    Stand to Sit 4: Min assist    Stand  to Sit Details (indicate cue type and reason) Manual facilitation for placement;Verbal cues for sequencing    Stand to Sit Details Min A for control with descent    Number of Reps 1 set;Other reps (comment)    Comments completed x 6 reps from w/c with single UE support from // bars      Ambulation/Gait   Ambulation/Gait Yes    Ambulation/Gait Assistance 4: Min assist    Ambulation/Gait Assistance Details Completed with R AFO and R Hand Orthotic on RW. PT providing placement for RUE on w/c. Completed ambulation x 140. Continue to provide cueing for step length on LLE to promote improved weight shift to RLE. intermittent standing rest breaks required. PT also donned blue shoe cover to allow for improved ability to bring RLE through with swing phase.    Ambulation Distance (Feet) 140 Feet    Assistive device Rolling walker    Gait Pattern Step-through pattern;Decreased stance time - right;Decreased step length - left;Decreased dorsiflexion - right;Decreased weight shift to right    Ambulation Surface Level;Indoor    Gait Comments PT provided aid with patient two tennis balls for patient's RW at home to allow for improved propulsion.      Neuro Re-ed    Neuro Re-ed Details  Completed standing  balance in // bars without UE support 2 x 1 minute each. Last rep patient able to stand without UE support and close superivison with intermittent CGA for 2 minutes. Progressed to completing lateral weight shift x 10 reps to R/L without UE support. PT providing tactile cues for improved weight shift to RLE, and stnading tall with quad/glute contraction during stance. Standing with single UE support on // bars completed steps forward with LLE 2 x 5 to promote improved stance and weight shift to RLE. Manual faciliation for weight shift at pelvis required with completion.               PT Education - 09/18/20 1123    Education Details progress toward LTGs    Person(s) Educated Patient;Caregiver(s)     Methods Explanation    Comprehension Verbalized understanding            PT Short Term Goals - 09/03/20 1106      PT SHORT TERM GOAL #1   Title Patient will be independent with HEP for strengthening with caregiver assistance (ALL STGs Due:    Baseline Patient/caregiver reports no issues with HEP    Time 3    Period Weeks    Status Achieved    Target Date 08/21/20      PT SHORT TERM GOAL #2   Title Patient will demo ability to complete sit <> stand with Min A and RW to demonstrate improved functional mobility    Baseline Mod A, Min A required on 12/2    Time 3    Period Weeks    Status Achieved      PT SHORT TERM GOAL #3   Title Patient will demo ability to complete stand pivot transfer with RW from w/c <> mat with Min A to demonstrate improved functional mobility    Baseline able to complete transfer with RW and Min A    Time 3    Period Weeks    Status Achieved             PT Long Term Goals - 09/18/20 1123      PT LONG TERM GOAL #1   Title Patient will be independent with final HEP with caregiver assistance (All LTGS Due: 09/18/20)    Baseline independence with caregiver assistance    Time 7    Period Weeks    Status Achieved      PT LONG TERM GOAL #2   Title Patient will demo ability to complete sit <> stand with CGA and LRAD to demonstrate improved functional mobility    Baseline Min A and RW    Time 7    Period Weeks    Status Not Met      PT LONG TERM GOAL #3   Title Patient wll demo ability to complete all bed mobility with CGA to demonstrate improved funcitonal mobility and reduced caregiver assistance    Baseline Mod A; Min a on 12/17    Time 7    Period Weeks    Status Not Met      PT LONG TERM GOAL #4   Title Patient will demo ability to ambulate > 20 ft with LRAD and R AFO donned with Mod A for improved household mobility    Baseline 140 ft with RW and AFO, Min A    Time 7    Period Weeks    Status Achieved           Updated Short Term  Goals:  PT Short Term Goals - 09/18/20 1132      PT SHORT TERM GOAL #1   Title Patient and caregiver will report completing ambulation 1-2x/daily within the home (All STGs Due at Visit 9)    Baseline not currently ambulating at home    Time 2    Period Weeks    Status New      PT SHORT TERM GOAL #2   Title Patient will be able to ambulate with RW and Min A x 175 ft to demonstrate improved household mobility    Baseline 140 ft Min A RW    Time 2    Period Weeks    Status Revised            Updated Long Term Goals:     PT Long Term Goals - 09/18/20 1135      PT LONG TERM GOAL #1   Title Patient will be independent with final HEP and walking program within the home with caregiver assistance(All LTGS Due at Visit 17)    Baseline conitnue to progress    Time 10    Period Weeks    Status Revised      PT LONG TERM GOAL #2   Title Patient will demo ability to complete sit <> stand with CGA and LRAD to demonstrate improved functional mobility    Baseline Min A and RW    Time 10    Period Weeks    Status On-going      PT LONG TERM GOAL #3   Title Patient wll demo ability to complete all bed mobility with CGA to demonstrate improved funcitonal mobility and reduced caregiver assistance    Baseline Mod A; Min a on 12/17    Time 10    Period Weeks    Status On-going      PT LONG TERM GOAL #4   Title Patient will demo ability to ambulate > 200 ft with LRAD and R AFO donned with CGA for improved household mobility    Baseline 140 ft with RW and AFO, Min A    Time 10    Period Weeks    Status Revised               Plan - 09/18/20 1126    Clinical Impression Statement Today's skilled PT session included assesment of patient's progress toward LTG. Patient able to meet LTG #1 and #5 today, as well as demonstrating progress toward all other LTG. Paitent is demonstrating improved ambulation distnace, ambulating 140 ft today with RW and min A. Patient continue to require Min A  for transfers and bed mobility. Rest of session focused on NMR activites to promote improved strengthening and weight shift to RLE. Patient will continue to benefit from skilled PT services to progress toward all unmet LTGs and promote improved functional mobility.    Personal Factors and Comorbidities Comorbidity 2    Comorbidities HTN, Alcohol Abuse, Diverticulosis    Examination-Activity Limitations Bed Mobility;Bathing;Sit;Transfers;Dressing;Stairs;Stand;Locomotion Level;Toileting    Examination-Participation Restrictions Occupation;Community Activity;Driving    Stability/Clinical Decision Making Evolving/Moderate complexity    Rehab Potential Good    PT Frequency 1x / week    PT Duration 2 weeks   followed by 1x/week for 8 weeks beginning January (MCD Visits Restart)   PT Treatment/Interventions ADLs/Self Care Home Management;Electrical Stimulation;DME Instruction;Gait training;Stair training;Therapeutic activities;Functional mobility training;Therapeutic exercise;Balance training;Neuromuscular re-education;Patient/family education;Orthotic Fit/Training;Wheelchair mobility training;Manual techniques;Passive range of motion    PT Next Visit Plan Schedule Visits into January (1x/week  for 8 weeks) continue gait training, sit <> stand training, and activites to promtoe improved weight shift to RLE.    PT Home Exercise Plan Access Code: [email or web address]    Consulted and Agree with Plan of Care Patient;Family member/caregiver    Family Member Consulted Daughter           Patient will benefit from skilled therapeutic intervention in order to improve the following deficits and impairments:  Abnormal gait,Decreased balance,Decreased mobility,Decreased endurance,Difficulty walking,Impaired sensation,Pain,Impaired UE functional use,Decreased strength,Decreased safety awareness,Decreased knowledge of use of DME,Decreased activity tolerance,Decreased range of motion,Impaired tone,Improper body mechanics  Visit  Diagnosis: 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 Anemia 05/31/2020  . Acute ischemic left MCA stroke ([place]) 04/30/2020  . Right hemiplegia ([place]) 04/30/2020  . CVA (cerebral vascular accident) () 04/21/2020  . Unspecified atrial fibrillation ([place]) 04/21/2020  . Essential hypertension 04/21/2020  . Alcohol abuse 04/21/2020    Jones Bales, PT, DPT 09/18/2020, 11:32 AM  [facility] [address] [place], [place], [address] Phone: [phone]   Fax:  [phone]  Name: Durinda Buzzelli MRN: [id number] Date of Birth: 10-20-1955

## 2020-09-18 NOTE — Therapy (Signed)
[facility] [address] [place], [place], [address] Phone: [phone]   Fax:  [phone]  Occupational Therapy Treatment  Patient Details  Name: Lindsey [name] MRN: [id number] Date of Birth: 1955-11-03 Referring Provider (OT): Sula Soda, MD   Encounter Date: 09/18/2020   OT End of Session - 09/18/20 1100    Visit Number 3    Number of Visits 9    Date for OT Re-Evaluation 11/12/20    Authorization Type UHC - Medicaid    Authorization Time Period VL: 27 PT/OT/ST combined    OT Start Time 1100    OT Stop Time 1145    OT Time Calculation (min) 45 min    Activity Tolerance Patient tolerated treatment well    Behavior During Therapy [facility] for tasks assessed/performed;Restless           Past Medical History:  Diagnosis Date  . Diverticulosis   . Hypertension   . Uterine prolapse     History reviewed. No pertinent surgical history.  There were no vitals filed for this visit.   Subjective Assessment - 09/18/20 1100    Subjective  Pt denies pain.    Patient is accompanied by: Family member   daughter   Pertinent History PMH significant for HTN, EtOH abuse, diverticulosis, CVA    Limitations Fall Risk. Not Driving. Aphasia. Sensory deficits RUE. Apraxia.    Patient Stated Goals get arm working better (right)    Currently in Pain? No/denies                        OT Treatments/Exercises (OP) - 09/18/20 1110      Visual/Perceptual Exercises   Scanning Tabletop    Scanning - Tabletop clocks - digital/analog x 12 with good attention to scanning. pt did not require any cues but attended to right side less than left.    Other Exercises assessed visual field with no evidenced of field cut this day      Neurological Re-education Exercises   Other Exercises 1 table slides with forward reach, horizontal abduction and circumduction x 10      Splinting   Splinting began making resting hand splint  for RUE for spasticity and tone management                    OT Short Term Goals - 09/03/20 1724      OT SHORT TERM GOAL #1   Title Pt and caregiver will be independent with HEP12/30/21    Baseline not issued yet    Time 4    Period Weeks    Status New    Target Date 10/01/20      OT SHORT TERM GOAL #2   Title Pt and caregiver will verbalize understanding of sensory strategies for safety regarding RUE with sensory deficits.    Baseline not reviewed/issued    Time 4    Period Weeks    Status New      OT SHORT TERM GOAL #3   Title Pt will complete table top scanning task with supervision and 75% accuracy.    Baseline right inatention and/or field deficits noted at eval    Time 4    Period Weeks    Status New      OT SHORT TERM GOAL #4   Title Pt will complete light snack or beverage prep with set up assistance of materials that are not in reach with supervision and  adapted strategies or equipment PRN    Baseline total A    Time 4    Period Weeks    Status New      OT SHORT TERM GOAL #5   Title Pt will don and doff shoes with supervision for increasing independence with basic ADLs    Baseline max A    Time 4    Period Weeks    Status New      OT SHORT TERM GOAL #6   Title Pt and caregiver will verbalize and demonstrate understanding of splints and/or orthoses PRN for right hemiplegia    Baseline none issued    Time 4    Period Weeks    Status New             OT Long Term Goals - 09/03/20 1757      OT LONG TERM GOAL #1   Title Pt and caregiver will be independent with updated HEP 10/29/20    Baseline not issued    Time 8    Period Weeks    Status New    Target Date 10/29/20      OT LONG TERM GOAL #2   Title Pt will perform UB dressing with min A for increase in independence with ADLs    Baseline max A    Time 8    Period Weeks    Status New      OT LONG TERM GOAL #3   Title Pt will perform table top scanning task with 100% accuracy     Baseline inattention to right    Time 8    Period Weeks    Status New      OT LONG TERM GOAL #4   Title Pt will achieve PROM of RUE shoulder flexion of 75 degrees with pain no greater than 4/10    Baseline 60 degrees pain 5/10    Time 8    Period Weeks    Status New      OT LONG TERM GOAL #5   Title Pt will report assisting with at least one home management or cooking activity per day in order to increase independence and decrease caregiver burden    Baseline not currently assisting    Time 8    Period Weeks    Status New                 Plan - 09/18/20 1328    Clinical Impression Statement Pt is progressing towards goals. Improvement with right attention and no visual fied cut noted this day.    OT Occupational Profile and History Problem Focused Assessment - Including review of records relating to presenting problem    Occupational performance deficits (Please refer to evaluation for details): ADL's;IADL's;Leisure    Body Structure / Function / Physical Skills ADL;Decreased knowledge of use of DME;Strength;Gait;GMC;Tone;Pain;Dexterity;Balance;UE functional use;Proprioception;ROM;IADL;Vision;Sensation;Coordination;FMC    Cognitive Skills Attention;Energy/Drive;Perception;Understand;Sequencing;Problem Solve    Rehab Potential Good    Clinical Decision Making Limited treatment options, no task modification necessary    Comorbidities Affecting Occupational Performance: None    Modification or Assistance to Complete Evaluation  No modification of tasks or assist necessary to complete eval    OT Frequency 1x / week    OT Duration 8 weeks   +eval   OT Treatment/Interventions Self-care/ADL training;Moist Heat;DME and/or AE instruction;Balance training;Aquatic Therapy;Therapeutic activities;Cognitive remediation/compensation;Therapeutic exercise;Visual/perceptual remediation/compensation;Passive range of motion;Neuromuscular education;Functional Mobility  Training;Cryotherapy;Electrical Stimulation;Energy conservation;Manual Therapy;Patient/family education    Plan schedule through January, finish  splint    OT Home Exercise Plan Self-PROM seated shoulder    Recommended Other Services Pt receiving ST and PT    Consulted and Agree with Plan of Care Patient;Family member/caregiver    Family Member Consulted daughter           Patient will benefit from skilled therapeutic intervention in order to improve the following deficits and impairments:   Body Structure / Function / Physical Skills: ADL,Decreased knowledge of use of DME,Strength,Gait,GMC,Tone,Pain,Dexterity,Balance,UE functional use,Proprioception,ROM,IADL,Vision,Sensation,Coordination,FMC Cognitive Skills: Attention,Energy/Drive,Perception,Understand,Sequencing,Problem Solve     Visit Diagnosis: Visuospatial deficit  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 Anemia 05/31/2020  . Acute ischemic left MCA stroke (HCC) 04/30/2020  . Right hemiplegia (HCC) 04/30/2020  . CVA (cerebral vascular accident) (HCC) 04/21/2020  . Unspecified atrial fibrillation (HCC) 04/21/2020  . Essential hypertension 04/21/2020  . Alcohol abuse 04/21/2020    Junious Dresser MOT, OTR/L  09/18/2020, 1:31 PM  [facility] [address] [place], [place], [address] Phone: [phone]   Fax:  [phone]  Name: Daijha Leggio MRN: [id number] Date of Birth: [birth year]

## 2020-09-18 NOTE — Therapy (Signed)
[facility] [address] [place], [place], [address] Phone: [phone]   Fax:  [phone]  Speech Language Pathology Treatment  Patient Details  Name: Lindsey [name] MRN: [id number] Date of Birth: 1956/07/28 Referring Provider (SLP): Dr. Sula Soda   Encounter Date: 09/18/2020   End of Session - 09/18/20 2244    Visit Number 3    Number of Visits 25    Date for SLP Re-Evaluation 11/13/20    Authorization Type UHC medicaid    SLP Start Time [id number]    SLP Stop Time  1016    SLP Time Calculation (min) 42 min    Activity Tolerance Patient tolerated treatment well           Past Medical History:  Diagnosis Date   Diverticulosis    Hypertension    Uterine prolapse     History reviewed. No pertinent surgical history.  There were no vitals filed for this visit.   Subjective Assessment - 09/18/20 0939    Subjective "My daughter"    Patient is accompained by: [name]   Currently in Pain? No/denies                 ADULT SLP TREATMENT - 09/18/20 0940      General Information   Behavior/Cognition Alert;Cooperative;Pleasant mood      Treatment Provided   Treatment provided Cognitive-Linquistic      Cognitive-Linquistic Treatment   Treatment focused on Aphasia;Patient/family/caregiver education    Skilled Treatment SLP leared pt enjoys card games so SLP used written cues for enhancing pt's expressive language to name two games she enjoys - mod cues consisteintly. Pt heavy perseveration throughout enture session with awareness 70% of the time. SLP then had pt separate deck into 4 suits with pt practicing suit name wiht written cues - heavy perseveration on "clubs" with awareness 50% of the time. SLP used max cues for success. Throughout session SLP educating daughter [name] on ways to work with her mom at home.      Assessment / Recommendations / Plan   Plan Continue with current plan of care       Progression Toward Goals   Progression toward goals Progressing toward goals            SLP Education - 09/18/20 2243    Education Details home tasks    Person(s) Educated Patient;Child(ren)    Methods Explanation;Demonstration;Verbal cues    Comprehension Verbalized understanding;Verbal cues required;Need further instruction            SLP Short Term Goals - 09/18/20 2246      SLP SHORT TERM GOAL #1   Title Family will use written choices of 2 or 3 to ID pt's wants/needs with occasional min A over 2 weeks outsdie of therapy    Baseline No written cues or choices being used    Time 3    Period Weeks    Status On-going      SLP SHORT TERM GOAL #2   Title Pt will use multimodal communication/AAC to communicate preferences for meal/snack 3x over 2 weeks with occasional min to mod A from family    Baseline no alternative communication strategies in place    Time 3    Period Weeks    Status On-going      SLP SHORT TERM GOAL #3   Title Pt will use multimodal/AAC to ID family members she wants to communicate with or about with occasional min  to mod A from family.    Time 3    Period Weeks    Status On-going      SLP SHORT TERM GOAL #4   Title Pt will name 3 items in simple personally relevant category using multimodal communication with occasional min A    Baseline no items in simple category    Time 5    Period Weeks    Status On-going      SLP SHORT TERM GOAL #5   Title Pt will ID 3 paraphasias with usual min to mod A over 4 sessions    Baseline no awareness of paraphasias or perseverations    Time 5    Period Weeks    Status On-going            SLP Long Term Goals - 09/18/20 2246      SLP LONG TERM GOAL #1   Title Pt will use multimodal communication to answer 5 preferences or answer 5 questions accurately at thte word level with min A from caregivers /ST over 3 sessions    Baseline other than yes/no, pt is not indicating preferences or answering basic  questions accurately    Time 11    Period Weeks    Status On-going      SLP LONG TERM GOAL #2   Title Pt will ID 5 paraphasias in a session with usual min A and self correct with frequent mod A in naming task or conversation over 2 sessions    Baseline pt unaware of paraphasias and perseverations    Time 11    Period Weeks    Status On-going      SLP LONG TERM GOAL #3   Title Caregivers will use written or photo cues at 2-3 word level to facilitate 3 turns in a conversation with pt and provide assistance in pt following a conversation topic with occasional min A over 2 sessions.    Baseline no multimodal supports in place for conversation success    Time 11    Period Weeks    Status On-going      SLP LONG TERM GOAL #4   Title Pt will name 5 items in personally relevant category using multmodal communciation with occasional min A    Baseline no items in simple category    Time 11    Period Weeks    Status On-going      SLP LONG TERM GOAL #5   Title Pt will attend aphasia conversation group (pending available technology) and family will ID 3 online/community support for persons with aphasia and caregivers    Baseline no community support identified    Time 67    Period Weeks    Status On-going            Plan - 09/18/20 2244    Clinical Impression Statement Nasira Janusz is referred for outpt ST due to aphasia s/p CVA 04/21/20. 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  Sunnie continues to present with severe non fluent aphasia characterized by perserveration and neologisms. Spontaneous speech and picture description with marked word finding difficulty and prominent paraphasias, however some grammar intact. I cont to recommend skilled ST to maximize language as well as train pt and caregiver in compensatory strategies to improve efficiency of communciation to improve QOL and reduce caregiver burden.    Speech Therapy Frequency 2x / week    Duration  12 weeks    Treatment/Interventions Compensatory  strategies;Patient/family education;Functional tasks;Cueing hierarchy;Environmental controls;Cognitive reorganization;Multimodal communcation approach;Language facilitation;Compensatory techniques;Internal/external aids;SLP instruction and feedback;Other (comment)    Potential to Achieve Goals Good    Potential Considerations Severity of impairments           Patient will benefit from skilled therapeutic intervention in order to improve the following deficits and impairments:   Aphasia    Problem List Patient Active Problem List   Diagnosis Date Noted   Anemia 05/31/2020   Acute ischemic left MCA stroke (HCC) 04/30/2020   Right hemiplegia (HCC) 04/30/2020   CVA (cerebral vascular accident) (HCC) 04/21/2020   Unspecified atrial fibrillation (HCC) 04/21/2020   Essential hypertension 04/21/2020   Alcohol abuse 04/21/2020    [facility] ,MS, CCC-SLP  09/18/2020, 10:46 PM  [facility] [address] [place], [place], [address] Phone: [phone]   Fax:  [phone]   Name: Tanvi Gatling MRN: [id number] Date of Birth: 10/19/1955

## 2020-09-24 ENCOUNTER — Ambulatory Visit: Payer: Medicaid Other

## 2020-09-24 ENCOUNTER — Ambulatory Visit: Payer: Medicaid Other | Admitting: Occupational Therapy

## 2020-09-24 ENCOUNTER — Other Ambulatory Visit: Payer: Self-pay

## 2020-09-24 ENCOUNTER — Encounter: Payer: Self-pay | Admitting: Occupational Therapy

## 2020-09-24 DIAGNOSIS — R41840 Attention and concentration deficit: Secondary | ICD-10-CM

## 2020-09-24 DIAGNOSIS — R262 Difficulty in walking, not elsewhere classified: Secondary | ICD-10-CM

## 2020-09-24 DIAGNOSIS — R278 Other lack of coordination: Secondary | ICD-10-CM

## 2020-09-24 DIAGNOSIS — I69851 Hemiplegia and hemiparesis following other cerebrovascular disease affecting right dominant side: Secondary | ICD-10-CM

## 2020-09-24 DIAGNOSIS — M6281 Muscle weakness (generalized): Secondary | ICD-10-CM | POA: Diagnosis not present

## 2020-09-24 DIAGNOSIS — R2681 Unsteadiness on feet: Secondary | ICD-10-CM

## 2020-09-24 DIAGNOSIS — M25631 Stiffness of right wrist, not elsewhere classified: Secondary | ICD-10-CM

## 2020-09-24 NOTE — Therapy (Signed)
[facility] [address] [place], [place], [address] Phone: [phone]   Fax:  [phone]  Physical Therapy Treatment  Patient Details  Name: Lindsey [name] MRN: [id number] Date of Birth: [birth year] Referring Provider (PT): Horton Chin, MD   Encounter Date: 09/24/2020   PT End of Session - 09/24/20 1752    Visit Number 8    Number of Visits 17    Date for PT Re-Evaluation --   POC for 10 weeks total   Authorization Type UHC Medicaid (VL: 27 between OT/PT/ST)    PT Start Time 1612    PT Stop Time 1652    PT Time Calculation (min) 40 min    Equipment Utilized During Treatment Gait belt    Activity Tolerance Patient tolerated treatment well    Behavior During Therapy WFL for tasks assessed/performed           Past Medical History:  Diagnosis Date  . Diverticulosis   . Hypertension   . Uterine prolapse     History reviewed. No pertinent surgical history.  There were no vitals filed for this visit.   Subjective Assessment - 09/24/20 1614    Subjective Patient reports no new changes or complaints. No falls or pain to report.    Patient is accompained by: Family member   Daughter   Pertinent History HTN, Alcohol Abuse    Limitations Standing;Walking    Patient Stated Goals Be able to walk    Currently in Pain? No/denies                 [facility] Adult PT Treatment/Exercise - 09/24/20 0001      Transfers   Transfers Sit to Stand;Stand to Sit    Sit to Stand 4: Min assist    Sit to Stand Details Tactile cues for weight shifting;Tactile cues for placement;Verbal cues for sequencing;Verbal cues for safe use of DME/AE;Verbal cues for technique;Manual facilitation for weight shifting;Manual facilitation for placement    Sit to Stand Details (indicate cue type and reason) PT providing intermittent verbal cues for hand placement on w/c prior to standing, PT also providing assistance for placement on RUE  onto hand orthotic on RW.    Stand to Sit 4: Min assist    Stand to Sit Details (indicate cue type and reason) Manual facilitation for placement;Verbal cues for sequencing    Stand to Sit Details verbal cues for control with descent, and ensuring removal of hand form RW orthotic prior to descent.    Number of Reps 1 set;Other reps (comment)    Comments Completed sit <> stand training with RW and R Hand orthotic provided by OT to patient today to allow for improved standing and gait at home. PT educating daughter and patient on technique for proper completion, including assitance with placement of RUE onto orthotic. PT demonstrating proper completion, and then having patient's daughter practice to ensure safety with completion. PT addressing/answering any of patient/patient's daughters concerns.      Ambulation/Gait   Ambulation/Gait Yes    Ambulation/Gait Assistance 4: Min guard;4: Min assist    Ambulation/Gait Assistance Details Donned patient's personal R AFO. Completed ambulation with RW and R Hand Orthotic for RW (OT provided patient with one today for personal use). Also donning blue cover ot shoe to promtoe improved swing phase with gait. Completed ambulation x 115 ft with PT providing CGA throughout, patient demonstrating improved ability to get RLE through with ambulation, require 1-2 instances of assistance. Verbal  cues for improved step length with LLE to promtoe improved weight shift to RLE. With second lap of ambulation x 115 ft, patient's daughter standing on side to guard. PT educating patient on proper hand placemnt for CGA and how to assist patient with ambulation within the home. PT also teaching daughter how to provide Min A to RLE as needed. Educating that htis may be needed on carpeted surface due to increase friction. PT also educating that will reach out to [employer]. Patient daughter will need to drop off patient shoe to allow for toe cap to be placed on R Shoe to promote improved gait  pattern. Intermittent rest breaks required between ambulation.    Ambulation Distance (Feet) 115 Feet   x 2   Assistive device Rolling walker   R AFO and R Hand Orthotic for RW   Gait Pattern Step-through pattern;Decreased stance time - right;Decreased step length - left;Decreased dorsiflexion - right;Decreased weight shift to right    Ambulation Surface Level;Indoor    Gait Comments PT educating patient and patient daughter to keep ambulation distnace <50 and straight path at this time for improved safety. Will conitnue to work on turns in next session. PT also educating to have one patient properly guard patient as ambulating, and have another person follow with w/c. Daughter verbalizing understanding.                  PT Education - 09/24/20 1751    Education Details Educated patient/daughter on ambulation within home with RW (See Gait Section for Details)    Person(s) Educated Patient;Child(ren)    Methods Explanation;Demonstration    Comprehension Verbalized understanding;Returned demonstration            PT Short Term Goals - 09/18/20 1132      PT SHORT TERM GOAL #1   Title Patient and caregiver will report completing ambulation 1-2x/daily within the home (All STGs Due at Visit 9)    Baseline not currently ambulating at home    Time 2    Period Weeks    Status New      PT SHORT TERM GOAL #2   Title Patient will be able to ambulate with RW and Min A x 175 ft to demonstrate improved household mobility    Baseline 140 ft Min A RW    Time 2    Period Weeks    Status Revised             PT Long Term Goals - 09/18/20 1135      PT LONG TERM GOAL #1   Title Patient will be independent with final HEP and walking program within the home with caregiver assistance(All LTGS Due at Visit 17)    Baseline conitnue to progress    Time 10    Period Weeks    Status Revised      PT LONG TERM GOAL #2   Title Patient will demo ability to complete sit <> stand with CGA and  LRAD to demonstrate improved functional mobility    Baseline Min A and RW    Time 10    Period Weeks    Status On-going      PT LONG TERM GOAL #3   Title Patient wll demo ability to complete all bed mobility with CGA to demonstrate improved funcitonal mobility and reduced caregiver assistance    Baseline Mod A; Min a on 12/17    Time 10    Period Weeks    Status On-going  PT LONG TERM GOAL #4   Title Patient will demo ability to ambulate > 200 ft with LRAD and R AFO donned with CGA for improved household mobility    Baseline 140 ft with RW and AFO, Min A    Time 10    Period Weeks    Status Revised                 Plan - 09/24/20 1804    Clinical Impression Statement Today's skilled PT session included continued sit <> stand and functional transfer training with patient and daughter. PT educating family on how to properly place RUE onto hand orthotic on RW upon standing. Completed gait trianing with patient, and educating daughter on proper guarding and assistance needed with ambulation. Will continue training at next session. PT addressed any questions/concerns of daughter at this time. Will continue per POC and progress toward all unmet goals.    Personal Factors and Comorbidities Comorbidity 2    Comorbidities HTN, Alcohol Abuse, Diverticulosis    Examination-Activity Limitations Bed Mobility;Bathing;Sit;Transfers;Dressing;Stairs;Stand;Locomotion Level;Toileting    Examination-Participation Restrictions Occupation;Community Activity;Driving    Stability/Clinical Decision Making Evolving/Moderate complexity    Rehab Potential Good    PT Frequency 1x / week    PT Duration 2 weeks   followed by 1x/week for 8 weeks beginning January (MCD Visits Restart)   PT Treatment/Interventions ADLs/Self Care Home Management;Electrical Stimulation;DME Instruction;Gait training;Stair training;Therapeutic activities;Functional mobility training;Therapeutic exercise;Balance  training;Neuromuscular re-education;Patient/family education;Orthotic Fit/Training;Wheelchair mobility training;Manual techniques;Passive range of motion    PT Next Visit Plan Continue gait training, sit <> stand training, and activites to promtoe improved weight shift to RLE.    PT Home Exercise Plan Access Code: [email or web address]    Consulted and Agree with Plan of Care Patient;Family member/caregiver    Family Member Consulted Daughter           Patient will benefit from skilled therapeutic intervention in order to improve the following deficits and impairments:  Abnormal gait,Decreased balance,Decreased mobility,Decreased endurance,Difficulty walking,Impaired sensation,Pain,Impaired UE functional use,Decreased strength,Decreased safety awareness,Decreased knowledge of use of DME,Decreased activity tolerance,Decreased range of motion,Impaired tone,Improper body mechanics  Visit Diagnosis: Unsteadiness on feet  Difficulty in walking, not elsewhere classified  Muscle weakness (generalized)  Hemiplegia and hemiparesis following other cerebrovascular disease affecting right dominant side [facility])     Problem List Patient Active Problem List   Diagnosis Date Noted  . Anemia 05/31/2020  . Acute ischemic left MCA stroke (HCC) 04/30/2020  . Right hemiplegia (HCC) 04/30/2020  . CVA (cerebral vascular accident) (HCC) 04/21/2020  . Unspecified atrial fibrillation (HCC) 04/21/2020  . Essential hypertension 04/21/2020  . Alcohol abuse 04/21/2020    Tempie Donning, PT, DPT 09/24/2020, 6:07 PM  [facility] [address] [place], [place], [address] Phone: [phone]   Fax:  [phone]  Name: Lindsey [name] MRN: [id number] Date of Birth: 07-24-1956

## 2020-09-24 NOTE — Therapy (Signed)
[facility] [address] [place], [place], [address] Phone: [phone]   Fax:  [phone]  Occupational Therapy Treatment  Patient Details  Name: Lindsey [name] MRN: [id number] Date of Birth: 07-12-1956 Referring Provider (OT): Sula Soda, MD   Encounter Date: 09/24/2020   OT End of Session - 09/24/20 1601    Visit Number 4    Number of Visits 9    Date for OT Re-Evaluation 11/12/20    Authorization Type UHC - Medicaid    Authorization Time Period VL: 27 PT/OT/ST combined    OT Start Time 1533    OT Stop Time 1615    OT Time Calculation (min) 42 min    Activity Tolerance Patient tolerated treatment well    Behavior During Therapy [facility] for tasks assessed/performed;Restless           Past Medical History:  Diagnosis Date  . Diverticulosis   . Hypertension   . Uterine prolapse     History reviewed. No pertinent surgical history.  There were no vitals filed for this visit.   Subjective Assessment - 09/24/20 1603    Subjective  Pt denies pain.    Patient is accompanied by: Family member   daughter   Pertinent History PMH significant for HTN, EtOH abuse, diverticulosis, CVA    Limitations Fall Risk. Not Driving. Aphasia. Sensory deficits RUE. Apraxia.    Patient Stated Goals get arm working better (right)    Currently in Pain? No/denies                        OT Treatments/Exercises (OP) - 09/24/20 1615      Splinting   Splinting modified and finished resting hand splint for RUE for spasticity for nighttime wear. Issued wear and care instructions. Review next session. Issued walker splint for amublation with PT and hemiplegia RUE.                  OT Education - 09/24/20 1615    Education Details issued splint instructions. see pt instructions    Person(s) Educated Patient;Child(ren)    Methods Explanation;Demonstration;Handout    Comprehension Verbalized  understanding;Returned demonstration            OT Short Term Goals - 09/03/20 1724      OT SHORT TERM GOAL #1   Title Pt and caregiver will be independent with HEP12/30/21    Baseline not issued yet    Time 4    Period Weeks    Status New    Target Date 10/01/20      OT SHORT TERM GOAL #2   Title Pt and caregiver will verbalize understanding of sensory strategies for safety regarding RUE with sensory deficits.    Baseline not reviewed/issued    Time 4    Period Weeks    Status New      OT SHORT TERM GOAL #3   Title Pt will complete table top scanning task with supervision and 75% accuracy.    Baseline right inatention and/or field deficits noted at eval    Time 4    Period Weeks    Status New      OT SHORT TERM GOAL #4   Title Pt will complete light snack or beverage prep with set up assistance of materials that are not in reach with supervision and adapted strategies or equipment PRN    Baseline total A    Time 4  Period Weeks    Status New      OT SHORT TERM GOAL #5   Title Pt will don and doff shoes with supervision for increasing independence with basic ADLs    Baseline max A    Time 4    Period Weeks    Status New      OT SHORT TERM GOAL #6   Title Pt and caregiver will verbalize and demonstrate understanding of splints and/or orthoses PRN for right hemiplegia    Baseline none issued    Time 4    Period Weeks    Status New             OT Long Term Goals - 09/03/20 1757      OT LONG TERM GOAL #1   Title Pt and caregiver will be independent with updated HEP 10/29/20    Baseline not issued    Time 8    Period Weeks    Status New    Target Date 10/29/20      OT LONG TERM GOAL #2   Title Pt will perform UB dressing with min A for increase in independence with ADLs    Baseline max A    Time 8    Period Weeks    Status New      OT LONG TERM GOAL #3   Title Pt will perform table top scanning task with 100% accuracy    Baseline inattention to  right    Time 8    Period Weeks    Status New      OT LONG TERM GOAL #4   Title Pt will achieve PROM of RUE shoulder flexion of 75 degrees with pain no greater than 4/10    Baseline 60 degrees pain 5/10    Time 8    Period Weeks    Status New      OT LONG TERM GOAL #5   Title Pt will report assisting with at least one home management or cooking activity per day in order to increase independence and decrease caregiver burden    Baseline not currently assisting    Time 8    Period Weeks    Status New                 Plan - 09/24/20 1616    Clinical Impression Statement Pt issued resting hand splint for spasticity RUE    OT Occupational Profile and History Problem Focused Assessment - Including review of records relating to presenting problem    Occupational performance deficits (Please refer to evaluation for details): ADL's;IADL's;Leisure    Body Structure / Function / Physical Skills ADL;Decreased knowledge of use of DME;Strength;Gait;GMC;Tone;Pain;Dexterity;Balance;UE functional use;Proprioception;ROM;IADL;Vision;Sensation;Coordination;FMC    Cognitive Skills Attention;Energy/Drive;Perception;Understand;Sequencing;Problem Solve    Rehab Potential Good    Clinical Decision Making Limited treatment options, no task modification necessary    Comorbidities Affecting Occupational Performance: None    Modification or Assistance to Complete Evaluation  No modification of tasks or assist necessary to complete eval    OT Frequency 1x / week    OT Duration 8 weeks   +eval   OT Treatment/Interventions Self-care/ADL training;Moist Heat;DME and/or AE instruction;Balance training;Aquatic Therapy;Therapeutic activities;Cognitive remediation/compensation;Therapeutic exercise;Visual/perceptual remediation/compensation;Passive range of motion;Neuromuscular education;Functional Mobility Training;Cryotherapy;Electrical Stimulation;Energy conservation;Manual Therapy;Patient/family education     Plan review splint and see how it is working for her    OT Home Exercise Plan Self-PROM seated shoulder    Recommended Other Services Pt receiving ST and PT  Consulted and Agree with Plan of Care Patient;Family member/caregiver    Family Member Consulted daughter           Patient will benefit from skilled therapeutic intervention in order to improve the following deficits and impairments:   Body Structure / Function / Physical Skills: ADL,Decreased knowledge of use of DME,Strength,Gait,GMC,Tone,Pain,Dexterity,Balance,UE functional use,Proprioception,ROM,IADL,Vision,Sensation,Coordination,FMC Cognitive Skills: Attention,Energy/Drive,Perception,Understand,Sequencing,Problem Solve     Visit Diagnosis: Stiffness of right wrist, not elsewhere classified  Other lack of coordination  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 Anemia 05/31/2020  . Acute ischemic left MCA stroke (HCC) 04/30/2020  . Right hemiplegia (HCC) 04/30/2020  . CVA (cerebral vascular accident) (HCC) 04/21/2020  . Unspecified atrial fibrillation (HCC) 04/21/2020  . Essential hypertension 04/21/2020  . Alcohol abuse 04/21/2020    Junious Dresser MOT, OTR/L  09/24/2020, 4:17 PM  [facility] [address] [place], [place], [address] Phone: [phone]   Fax:  [phone]  Name: Shanen Norris MRN: [id number] Date of Birth: January 01, 1956

## 2020-09-24 NOTE — Patient Instructions (Signed)
Your Splint This splint should initially be fitted by a healthcare practitioner.  The healthcare practitioner is responsible for providing wearing instructions and precautions to the patient, other healthcare practitioners and care provider involved in the patient's care.  This splint was custom made for you. Please read the following instructions to learn about wearing and caring for your splint.  Precautions Should your splint cause any of the following problems, remove the splint immediately and contact your therapist/physician.  Swelling  Severe Pain  Pressure Areas  Stiffness  Numbness  Do not wear your splint while operating machinery unless it has been fabricated for that purpose.  When To Wear Your Splint Where your splint according to your therapist/physician instructions. We are working towards nighttime wear. Please start by seeing how the splint is tolerated for 2 hours. If tolerating well, move to 3 hours and so on.   Nights and rest periods only is the goal.  Care and Cleaning of Your Splint 1. Keep your splint away from open flames. 2. Your splint will lose its shape in temperatures over 135 degrees Farenheit, ( in car windows, near radiators, ovens or in hot water).  Never make any adjustments to your splint, if the splint needs adjusting remove it and make an appointment to see your therapist. 3. Your splint, including the cushion liner may be cleaned with soap and lukewarm water.  Do not immerse in hot water over 135 degrees Farenheit. 4. Straps may be washed with soap and water, but do not moisten the self-adhesive portion. 5. For ink or hard to remove spots use a scouring cleanser which contains chlorine.  Rinse the splint thoroughly after using chlorine cleanser.

## 2020-09-27 DIAGNOSIS — G464 Cerebellar stroke syndrome: Secondary | ICD-10-CM | POA: Diagnosis not present

## 2020-09-28 DIAGNOSIS — G464 Cerebellar stroke syndrome: Secondary | ICD-10-CM | POA: Diagnosis not present

## 2020-09-29 DIAGNOSIS — G464 Cerebellar stroke syndrome: Secondary | ICD-10-CM | POA: Diagnosis not present

## 2020-09-30 DIAGNOSIS — G464 Cerebellar stroke syndrome: Secondary | ICD-10-CM | POA: Diagnosis not present

## 2020-10-01 ENCOUNTER — Ambulatory Visit: Payer: Medicaid Other | Admitting: Occupational Therapy

## 2020-10-01 ENCOUNTER — Ambulatory Visit: Payer: Medicaid Other

## 2020-10-01 ENCOUNTER — Other Ambulatory Visit: Payer: Self-pay

## 2020-10-01 ENCOUNTER — Encounter: Payer: Self-pay | Admitting: Occupational Therapy

## 2020-10-01 DIAGNOSIS — I69851 Hemiplegia and hemiparesis following other cerebrovascular disease affecting right dominant side: Secondary | ICD-10-CM

## 2020-10-01 DIAGNOSIS — R41842 Visuospatial deficit: Secondary | ICD-10-CM

## 2020-10-01 DIAGNOSIS — R262 Difficulty in walking, not elsewhere classified: Secondary | ICD-10-CM

## 2020-10-01 DIAGNOSIS — M6281 Muscle weakness (generalized): Secondary | ICD-10-CM | POA: Diagnosis not present

## 2020-10-01 DIAGNOSIS — R2681 Unsteadiness on feet: Secondary | ICD-10-CM

## 2020-10-01 DIAGNOSIS — M25631 Stiffness of right wrist, not elsewhere classified: Secondary | ICD-10-CM

## 2020-10-01 DIAGNOSIS — R41844 Frontal lobe and executive function deficit: Secondary | ICD-10-CM

## 2020-10-01 DIAGNOSIS — R41840 Attention and concentration deficit: Secondary | ICD-10-CM

## 2020-10-01 DIAGNOSIS — G464 Cerebellar stroke syndrome: Secondary | ICD-10-CM | POA: Diagnosis not present

## 2020-10-01 DIAGNOSIS — R278 Other lack of coordination: Secondary | ICD-10-CM

## 2020-10-01 DIAGNOSIS — R4701 Aphasia: Secondary | ICD-10-CM

## 2020-10-01 NOTE — Patient Instructions (Signed)
° °  Self Passive Range of Motion   While lying down, clasp hands together and reach towards headboard for a stretch for your shoulder. Repeat 10 times and do 2-3 times a day.  While lying down, clasp hands together and reach side to side for a stretch for your shoulder. Repeat 10 times and do 2-3 times a day.  While lying down, clasp hands together and reach up towards the ceiling for a stretch for your shoulder. Repeat 10 times and do 2-3 times a day.    Self Passive Range of Motion   In sitting, clasp hands together and bend down towards toes for a stretch for your shoulder. Repeat 10 times and do 2-3 times a day.

## 2020-10-01 NOTE — Therapy (Signed)
[facility] [address] [place], [place], [address] Phone: [phone]   Fax:  [phone]  Speech Language Pathology Treatment  Patient Details  Name: Lindsey [name] MRN: [id number] Date of Birth: [birth year] Referring Provider (SLP): Dr. Sula Soda   Encounter Date: 10/01/2020   End of Session - 10/01/20 1649    Visit Number 4    Number of Visits 25    Date for SLP Re-Evaluation 11/13/20    Authorization Type UHC medicaid    SLP Start Time 1539    SLP Stop Time  1615    SLP Time Calculation (min) 36 min    Activity Tolerance Patient tolerated treatment well           Past Medical History:  Diagnosis Date  . Diverticulosis   . Hypertension   . Uterine prolapse     History reviewed. No pertinent surgical history.  There were no vitals filed for this visit.   Subjective Assessment - 10/01/20 1628    Subjective "Yes." (SLP asked pt how she's doing today)    Patient is accompained by: Family member   Lindsey [name]   Currently in Pain? No/denies                 ADULT SLP TREATMENT - 10/01/20 0001      General Information   Behavior/Cognition Alert;Cooperative;Pleasant mood   tearful x3 during session     Treatment Provided   Treatment provided Cognitive-Linquistic      Cognitive-Linquistic Treatment   Treatment focused on Aphasia;Patient/family/caregiver education    Skilled Treatment Heavy perseveration today with "nickel" (coins task) and "[name]" (grandchildren names task). Even with max cues (auditory, simultaneous production, written cues) pt cont to perseverate on these two words. Pt/family education re: language will improve but not be 100% of pre-CVA. SLP also suggested strongly to daughter that pt use low tech AAC with grandchildren pictures and names, highly salient items (hobbies), foods pt enjoys - all with written names with sticky notes. SLP expects this will have to be expanded in  teh future. Daughter stated pt gravitates away from technology. Educated that salient items to pt (family, food, hobbies, high interest tihngs) will have better chance of beign communicated than random pictures or objects, and a low-tech AAC book can both assist pt in communication via pointing/showing item to family, and can assist pt in practicing speaking these items as well.      Assessment / Recommendations / Plan   Plan Continue with current plan of care      Progression Toward Goals   Progression toward goals Progressing toward goals            SLP Education - 10/01/20 1649    Education Details start a low-tech AAC book    Person(s) Educated Patient;Child(ren)    Methods Explanation;Demonstration    Comprehension Verbalized understanding;Need further instruction            SLP Short Term Goals - 10/01/20 1650      SLP SHORT TERM GOAL #1   Title Family will use written choices of 2 or 3 to ID pt's wants/needs with occasional min A over 2 weeks outsdie of therapy    Baseline No written cues or choices being used    Time 2    Period Weeks    Status On-going      SLP SHORT TERM GOAL #2   Title Pt will use multimodal communication/AAC to communicate preferences for meal/snack 3x  over 2 weeks with occasional min to mod A from family    Baseline no alternative communication strategies in place    Time 2    Period Weeks    Status On-going      SLP SHORT TERM GOAL #3   Title Pt will use multimodal/AAC to ID family members she wants to communicate with or about with occasional min to mod A from family.    Time 2    Period Weeks    Status On-going      SLP SHORT TERM GOAL #4   Title Pt will name 3 items in simple personally relevant category using multimodal communication with occasional min A    Baseline no items in simple category    Time 4    Period Weeks    Status On-going      SLP SHORT TERM GOAL #5   Title Pt will ID 3 paraphasias with usual min to mod A over 4  sessions    Baseline no awareness of paraphasias or perseverations    Time 4    Period Weeks    Status On-going            SLP Long Term Goals - 10/01/20 1650      SLP LONG TERM GOAL #1   Title Pt will use multimodal communication to answer 5 preferences or answer 5 questions accurately at thte word level with min A from caregivers /ST over 3 sessions    Baseline other than yes/no, pt is not indicating preferences or answering basic questions accurately    Time 10    Period Weeks    Status On-going      SLP LONG TERM GOAL #2   Title Pt will ID 5 paraphasias in a session with usual min A and self correct with frequent mod A in naming task or conversation over 2 sessions    Baseline pt unaware of paraphasias and perseverations    Time 10    Period Weeks    Status On-going      SLP LONG TERM GOAL #3   Title Caregivers will use written or photo cues at 2-3 word level to facilitate 3 turns in a conversation with pt and provide assistance in pt following a conversation topic with occasional min A over 2 sessions.    Baseline no multimodal supports in place for conversation success    Time 10    Period Weeks    Status On-going      SLP LONG TERM GOAL #4   Title Pt will name 5 items in personally relevant category using multmodal communciation with occasional min A    Baseline no items in simple category    Time 10    Period Weeks    Status On-going      SLP LONG TERM GOAL #5   Title Pt will attend aphasia conversation group (pending available technology) and family will ID 3 online/community support for persons with aphasia and caregivers    Baseline no community support identified    Time 10    Period Weeks    Status On-going            Plan - 10/01/20 1649    Clinical Impression Statement Lindsey [name] is referred for outpt ST due to aphasia s/p CVA 04/21/20. 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  Lindsey [name] continues to present with  severe non fluent aphasia characterized by perserveration and neologisms. Spontaneous  speech and picture description with marked word finding difficulty and prominent paraphasias, however some grammar intact. I cont to recommend skilled ST to maximize language as well as train pt and caregiver in compensatory strategies to improve efficiency of communciation to improve QOL and reduce caregiver burden.    Speech Therapy Frequency 2x / week    Duration 12 weeks    Treatment/Interventions Compensatory strategies;Patient/family education;Functional tasks;Cueing hierarchy;Environmental controls;Cognitive reorganization;Multimodal communcation approach;Language facilitation;Compensatory techniques;Internal/external aids;SLP instruction and feedback;Other (comment)    Potential to Achieve Goals Good    Potential Considerations Severity of impairments           Patient will benefit from skilled therapeutic intervention in order to improve the following deficits and impairments:   Aphasia    Problem List Patient Active Problem List   Diagnosis Date Noted  . Anemia 05/31/2020  . Acute ischemic left MCA stroke (HCC) 04/30/2020  . Right hemiplegia (HCC) 04/30/2020  . CVA (cerebral vascular accident) (HCC) 04/21/2020  . Unspecified atrial fibrillation (HCC) 04/21/2020  . Essential hypertension 04/21/2020  . Alcohol abuse 04/21/2020    [facility] ,MS, CCC-SLP  10/01/2020, 4:51 PM  [facility] [address] [place], [place], [address] Phone: [phone]   Fax:  [phone]   Name: Lindsey [name] MRN: [id number] Date of Birth: 12-Apr-1956

## 2020-10-01 NOTE — Patient Instructions (Signed)
° °  Use a picture book with labels for names of things to help [place] say words.   Family, items she enjoys, foods - start with these. She can practice names with this book and can also communicate by pointing to these things. Have her say the name "(attempt to say) when she points.

## 2020-10-01 NOTE — Therapy (Signed)
[facility] [address] [place], [place], [address] Phone: [phone]   Fax:  [phone]  Occupational Therapy Treatment  Patient Details  Name: Lindsey [name] MRN: [id number] Date of Birth: 1956/07/25 Referring Provider (OT): Sula Soda, MD   Encounter Date: 10/01/2020   OT End of Session - 10/01/20 1702    Visit Number 5    Number of Visits 9    Date for OT Re-Evaluation 11/12/20    Authorization Type UHC - Medicaid    Authorization Time Period VL: 27 PT/OT/ST combined    OT Start Time 1702    OT Stop Time 1742    OT Time Calculation (min) 40 min    Activity Tolerance Patient tolerated treatment well    Behavior During Therapy [facility] for tasks assessed/performed;Restless           Past Medical History:  Diagnosis Date  . Diverticulosis   . Hypertension   . Uterine prolapse     History reviewed. No pertinent surgical history.  There were no vitals filed for this visit.   Subjective Assessment - 10/01/20 1703    Subjective  Pt denies any pain. Pt's daughter requested extra piece of velcro for resting hand splint.    Patient is accompanied by: Family member   daughter   Pertinent History PMH significant for HTN, EtOH abuse, diverticulosis, CVA    Limitations Fall Risk. Not Driving. Aphasia. Sensory deficits RUE. Apraxia.    Patient Stated Goals get arm working better (right)    Currently in Pain? No/denies                        OT Treatments/Exercises (OP) - 10/01/20 1708      Neurological Re-education Exercises   Other Exercises 1 self PROM in supine with shoulder flexion, chest press, horizontal abudction x 10. Pt unable to tolerate horizontal abduction d/t pain this day. Pt did self PROM in sitting with reaching to floor x 10 from edge of mat    Other Weight-Bearing Exercises 1 weight bearing into RUE on mat with diagonal reaching pattern with LUE x 10 with assistance for  maintaining RUE positioning on mat.      Manual Therapy   Manual Therapy Passive ROM    Passive ROM in supine for RUE shoulder, elbow and hand                  OT Education - 10/01/20 1747    Education Details self PROM - see pt instructions    Person(s) Educated Patient;Caregiver(s)    Methods Explanation;Demonstration;Handout    Comprehension Verbalized understanding;Returned demonstration            OT Short Term Goals - 09/03/20 1724      OT SHORT TERM GOAL #1   Title Pt and caregiver will be independent with HEP12/30/21    Baseline not issued yet    Time 4    Period Weeks    Status New    Target Date 10/01/20      OT SHORT TERM GOAL #2   Title Pt and caregiver will verbalize understanding of sensory strategies for safety regarding RUE with sensory deficits.    Baseline not reviewed/issued    Time 4    Period Weeks    Status New      OT SHORT TERM GOAL #3   Title Pt will complete table top scanning task with supervision and 75% accuracy.  Baseline right inatention and/or field deficits noted at eval    Time 4    Period Weeks    Status New      OT SHORT TERM GOAL #4   Title Pt will complete light snack or beverage prep with set up assistance of materials that are not in reach with supervision and adapted strategies or equipment PRN    Baseline total A    Time 4    Period Weeks    Status New      OT SHORT TERM GOAL #5   Title Pt will don and doff shoes with supervision for increasing independence with basic ADLs    Baseline max A    Time 4    Period Weeks    Status New      OT SHORT TERM GOAL #6   Title Pt and caregiver will verbalize and demonstrate understanding of splints and/or orthoses PRN for right hemiplegia    Baseline none issued    Time 4    Period Weeks    Status New             OT Long Term Goals - 09/03/20 1757      OT LONG TERM GOAL #1   Title Pt and caregiver will be independent with updated HEP 10/29/20    Baseline not  issued    Time 8    Period Weeks    Status New    Target Date 10/29/20      OT LONG TERM GOAL #2   Title Pt will perform UB dressing with min A for increase in independence with ADLs    Baseline max A    Time 8    Period Weeks    Status New      OT LONG TERM GOAL #3   Title Pt will perform table top scanning task with 100% accuracy    Baseline inattention to right    Time 8    Period Weeks    Status New      OT LONG TERM GOAL #4   Title Pt will achieve PROM of RUE shoulder flexion of 75 degrees with pain no greater than 4/10    Baseline 60 degrees pain 5/10    Time 8    Period Weeks    Status New      OT LONG TERM GOAL #5   Title Pt will report assisting with at least one home management or cooking activity per day in order to increase independence and decrease caregiver burden    Baseline not currently assisting    Time 8    Period Weeks    Status New                 Plan - 10/01/20 1746    Clinical Impression Statement Pt continues to have significant spasticity in RUE. Pt with report of good success with resting hand splint on RUE.    OT Occupational Profile and History Problem Focused Assessment - Including review of records relating to presenting problem    Occupational performance deficits (Please refer to evaluation for details): ADL's;IADL's;Leisure    Body Structure / Function / Physical Skills ADL;Decreased knowledge of use of DME;Strength;Gait;GMC;Tone;Pain;Dexterity;Balance;UE functional use;Proprioception;ROM;IADL;Vision;Sensation;Coordination;FMC    Cognitive Skills Attention;Energy/Drive;Perception;Understand;Sequencing;Problem Solve    Rehab Potential Good    Clinical Decision Making Limited treatment options, no task modification necessary    Comorbidities Affecting Occupational Performance: None    Modification or Assistance to Complete Evaluation  No  modification of tasks or assist necessary to complete eval    OT Frequency 1x / week    OT  Duration 8 weeks   +eval   OT Treatment/Interventions Self-care/ADL training;Moist Heat;DME and/or AE instruction;Balance training;Aquatic Therapy;Therapeutic activities;Cognitive remediation/compensation;Therapeutic exercise;Visual/perceptual remediation/compensation;Passive range of motion;Neuromuscular education;Functional Mobility Training;Cryotherapy;Electrical Stimulation;Energy conservation;Manual Therapy;Patient/family education    Plan NMR RUE    OT Home Exercise Plan Self-PROM seated shoulder    Recommended Other Services Pt receiving ST and PT    Consulted and Agree with Plan of Care Patient;Family member/caregiver    Family Member Consulted daughter           Patient will benefit from skilled therapeutic intervention in order to improve the following deficits and impairments:   Body Structure / Function / Physical Skills: ADL,Decreased knowledge of use of DME,Strength,Gait,GMC,Tone,Pain,Dexterity,Balance,UE functional use,Proprioception,ROM,IADL,Vision,Sensation,Coordination,FMC Cognitive Skills: Attention,Energy/Drive,Perception,Understand,Sequencing,Problem Solve     Visit Diagnosis: Unsteadiness on feet  Muscle weakness (generalized)  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 Anemia 05/31/2020  . Acute ischemic left MCA stroke (HCC) 04/30/2020  . Right hemiplegia (HCC) 04/30/2020  . CVA (cerebral vascular accident) (HCC) 04/21/2020  . Unspecified atrial fibrillation (HCC) 04/21/2020  . Essential hypertension 04/21/2020  . Alcohol abuse 04/21/2020    Junious Dresser MOT, OTR/L  10/01/2020, 5:47 PM  [facility] [address] [place], [place], [address] Phone: [phone]   Fax:  [phone]  Name: Lindsey [name] MRN: [id number] Date of Birth: 1956-04-20

## 2020-10-01 NOTE — Therapy (Signed)
[facility] [address] [place], [place], [address] Phone: [phone]   Fax:  [phone]  Physical Therapy Treatment  Patient Details  Name: Lindsey [name] MRN: [id number] Date of Birth: Dec 20, 1955 Referring Provider (PT): Horton Chin, MD   Encounter Date: 10/01/2020   PT End of Session - 10/01/20 1615    Visit Number 9    Number of Visits 17    Date for PT Re-Evaluation --   POC for 10 weeks total   Authorization Type UHC Medicaid (VL: 27 between OT/PT/ST)    PT Start Time 1620   patient finishing up with Speech Therapy   PT Stop Time 1700    PT Time Calculation (min) 40 min    Equipment Utilized During Treatment Gait belt    Activity Tolerance Patient tolerated treatment well    Behavior During Therapy [facility] for tasks assessed/performed           Past Medical History:  Diagnosis Date  . Diverticulosis   . Hypertension   . Uterine prolapse     History reviewed. No pertinent surgical history.  There were no vitals filed for this visit.   Subjective Assessment - 10/01/20 1711    Subjective No new changes. Patient and daughter reporting they have been ambulating in the house and is going well.    Patient is accompained by: Family member   Daughter; Caregiver   Pertinent History HTN, Alcohol Abuse    Limitations Standing;Walking    Patient Stated Goals Be able to walk    Currently in Pain? No/denies                [facility] Adult PT Treatment/Exercise - 10/01/20 0001      Transfers   Transfers Sit to Stand;Stand to Sit    Sit to Stand 4: Min assist    Sit to Stand Details Tactile cues for weight shifting;Tactile cues for placement;Verbal cues for sequencing;Verbal cues for safe use of DME/AE;Verbal cues for technique;Manual facilitation for weight shifting;Manual facilitation for placement    Sit to Stand Details (indicate cue type and reason) PT continue to provide assistance upon standing for  placement of RUE into hand orthotic on RW. intermittent cues for hand placement as patient often placed on RW and attempts to pull    Stand to Sit 4: Min assist    Stand to Sit Details (indicate cue type and reason) Manual facilitation for placement;Verbal cues for sequencing    Stand to Sit Details verbal cues to ensure remove RUE from hand orthotic as patient is quick to attempt to sit when aligned with surface    Squat Pivot Transfers 4: Min assist    Squat Pivot Transfer Details (indicate cue type and reason) completed transfer from w/c <> mat with Min A no AD, PT providing vebral cues for seuqencing and manual faciliatoin for weight shift with completion      Ambulation/Gait   Ambulation/Gait Yes    Ambulation/Gait Assistance 4: Min guard;4: Min assist    Ambulation/Gait Assistance Details Donned R AFO. Completed ambulation with RW and w/c follow x 90 ft, x 85 ft. Intermittent Min A required for RLE due to fatigue today. Verbal cues for improved step length with LLE to promote stance on RLE. Progressed to completing short distance ambulation with working on turns to various surfaces with RW prior to descent, complelted x 4 reps. Paitent needing hand over hand assistance to help manage RW due to difficulty turning due  to RUE weakness.    Ambulation Distance (Feet) 90 Feet   x 1, 85 x 1, 10 x 4   Assistive device Rolling walker   R AFO, R Hand Orthotic   Gait Pattern Step-through pattern;Decreased stance time - right;Decreased step length - left;Decreased dorsiflexion - right;Decreased weight shift to right    Ambulation Surface Level;Indoor    Gait Comments PT educating daughter to bring in patient's personal R Hand Orthotic for RW into next session. PT also educating caregiver on how to properly assist with turns for ambulation within the home.      Exercises   Exercises Other Exercises    Other Exercises  Completed heel slides seated in w/c with pillowcase under RLE, completed x 10 reps, min  A needed at times for improved knee flexion, but demo improvements iwht knee extension.                  PT Education - 10/01/20 1702    Education Details educated caregiver on how to assist patient with turns when ambulating with RW in the home    Person(s) Educated Patient;Caregiver(s)    Methods Explanation;Demonstration    Comprehension Verbalized understanding            PT Short Term Goals - 09/18/20 1132      PT SHORT TERM GOAL #1   Title Patient and caregiver will report completing ambulation 1-2x/daily within the home (All STGs Due at Visit 9)    Baseline not currently ambulating at home    Time 2    Period Weeks    Status New      PT SHORT TERM GOAL #2   Title Patient will be able to ambulate with RW and Min A x 175 ft to demonstrate improved household mobility    Baseline 140 ft Min A RW    Time 2    Period Weeks    Status Revised             PT Long Term Goals - 09/18/20 1135      PT LONG TERM GOAL #1   Title Patient will be independent with final HEP and walking program within the home with caregiver assistance(All LTGS Due at Visit 17)    Baseline conitnue to progress    Time 10    Period Weeks    Status Revised      PT LONG TERM GOAL #2   Title Patient will demo ability to complete sit <> stand with CGA and LRAD to demonstrate improved functional mobility    Baseline Min A and RW    Time 10    Period Weeks    Status On-going      PT LONG TERM GOAL #3   Title Patient wll demo ability to complete all bed mobility with CGA to demonstrate improved funcitonal mobility and reduced caregiver assistance    Baseline Mod A; Min a on 12/17    Time 10    Period Weeks    Status On-going      PT LONG TERM GOAL #4   Title Patient will demo ability to ambulate > 200 ft with LRAD and R AFO donned with CGA for improved household mobility    Baseline 140 ft with RW and AFO, Min A    Time 10    Period Weeks    Status Revised                  Plan -  10/01/20 1616    Clinical Impression Statement Today's skilled PT session focused on continued gait training, specifically working toward ambulation to various surfaces working on improved turn to allow for improved household mobility. PT continued education with caregiver on techniques at end of session to promote improved safety. Will continue to progress toward all LTGs.    Personal Factors and Comorbidities Comorbidity 2    Comorbidities HTN, Alcohol Abuse, Diverticulosis    Examination-Activity Limitations Bed Mobility;Bathing;Sit;Transfers;Dressing;Stairs;Stand;Locomotion Level;Toileting    Examination-Participation Restrictions Occupation;Community Activity;Driving    Stability/Clinical Decision Making Evolving/Moderate complexity    Rehab Potential Good    PT Frequency 1x / week    PT Duration 2 weeks   followed by 1x/week for 8 weeks beginning January (MCD Visits Restart)   PT Treatment/Interventions ADLs/Self Care Home Management;Electrical Stimulation;DME Instruction;Gait training;Stair training;Therapeutic activities;Functional mobility training;Therapeutic exercise;Balance training;Neuromuscular re-education;Patient/family education;Orthotic Fit/Training;Wheelchair mobility training;Manual techniques;Passive range of motion    PT Next Visit Plan Continue gait training working on improved weight shift, sit <> stand training, and activites to promote improved weight shift to RLE.    PT Home Exercise Plan Access Code: [email or web address]    Consulted and Agree with Plan of Care Patient;Family member/caregiver    Family Member Consulted Daughter           Patient will benefit from skilled therapeutic intervention in order to improve the following deficits and impairments:  Abnormal gait,Decreased balance,Decreased mobility,Decreased endurance,Difficulty walking,Impaired sensation,Pain,Impaired UE functional use,Decreased strength,Decreased safety awareness,Decreased knowledge of use of  DME,Decreased activity tolerance,Decreased range of motion,Impaired tone,Improper body mechanics  Visit Diagnosis: Unsteadiness on feet  Difficulty in walking, not elsewhere classified  Muscle weakness (generalized)  Hemiplegia and hemiparesis following other cerebrovascular disease affecting right dominant side [facility])     Problem List Patient Active Problem List   Diagnosis Date Noted  . Anemia 05/31/2020  . Acute ischemic left MCA stroke (HCC) 04/30/2020  . Right hemiplegia (HCC) 04/30/2020  . CVA (cerebral vascular accident) (HCC) 04/21/2020  . Unspecified atrial fibrillation (HCC) 04/21/2020  . Essential hypertension 04/21/2020  . Alcohol abuse 04/21/2020    Tempie Donning, PT, DPT 10/01/2020, 5:17 PM  [facility] [place] [facility] [address] [place], [place], [address] Phone: [phone]   Fax:  [phone]  Name: Lindsey [name] MRN: [id number] Date of Birth: Feb 12, 1956

## 2020-10-02 DIAGNOSIS — G464 Cerebellar stroke syndrome: Secondary | ICD-10-CM | POA: Diagnosis not present

## 2020-10-03 DIAGNOSIS — G464 Cerebellar stroke syndrome: Secondary | ICD-10-CM | POA: Diagnosis not present

## 2020-10-04 DIAGNOSIS — G464 Cerebellar stroke syndrome: Secondary | ICD-10-CM | POA: Diagnosis not present

## 2020-10-05 DIAGNOSIS — G464 Cerebellar stroke syndrome: Secondary | ICD-10-CM | POA: Diagnosis not present

## 2020-10-06 ENCOUNTER — Encounter: Payer: Medicaid Other | Attending: Registered Nurse | Admitting: Physical Medicine and Rehabilitation

## 2020-10-06 ENCOUNTER — Encounter: Payer: Self-pay | Admitting: Physical Medicine and Rehabilitation

## 2020-10-06 ENCOUNTER — Other Ambulatory Visit: Payer: Self-pay

## 2020-10-06 VITALS — BP 105/72 | HR 73 | Resp 14

## 2020-10-06 DIAGNOSIS — R252 Cramp and spasm: Secondary | ICD-10-CM

## 2020-10-06 DIAGNOSIS — G8191 Hemiplegia, unspecified affecting right dominant side: Secondary | ICD-10-CM | POA: Diagnosis not present

## 2020-10-06 DIAGNOSIS — G464 Cerebellar stroke syndrome: Secondary | ICD-10-CM | POA: Diagnosis not present

## 2020-10-06 DIAGNOSIS — I63512 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 middle cerebral artery: Secondary | ICD-10-CM

## 2020-10-06 DIAGNOSIS — R4701 Aphasia: Secondary | ICD-10-CM | POA: Diagnosis not present

## 2020-10-06 DIAGNOSIS — I69398 Other sequelae of cerebral infarction: Secondary | ICD-10-CM | POA: Diagnosis not present

## 2020-10-06 NOTE — Progress Notes (Signed)
Subjective:    Patient ID: Lindsey [name], female    DOB: [birth year], 65 y.o.   MRN: [id number]  HPI: Lindsey [name] is a 65 y.o. female who is here for Hospital Follow Up of Her ischemic left MCA stroke, Right Hemiplegia and Essential Hypertension. Lindsey [name] was brought to [facility] ED on 07/202/2021 with right sided weakness and slurred speech.   Her BP today is much better controlled.   Her daughter brings in her BP log and her pressures have been pretty high on this. She continues to take the amlodipine and lopressor.   All medications reviewed and she does not require refills.  She has run out of therapy sessions and and continues to have severe deficits in mobility, cognition, and speech  Eating well.   Sleeping well.   Using Mirtazepine as needed.   Prior history: She has been working on balance in therapy. She had her SLP evaluation and next week has her OT eval next week. She has been ambulating. She needs assistance to walk and her daughter does not feel comfortable walking with her on her own- right now she ambulates mostly with physical therapy.   She his using the bedside commode. Her daughter has been helping her. She works evening.   Mrs. [name] says things have been going well at home.   Moving bowels regularly.   Her BP is low in office to 100/69. Her daughter has ordered a BP machine.   She does not need any refills this visit.   Denies pain at this time. Has been taking the Gabapentin BID  Denies depression but does have sadness  Has been eating well.    CT Head WO Contrast:  IMPRESSION: 1. There is a large hypodensity of the anterior left MCA territory, findings consistent with acute to subacute left MCA territory infarction. ASPECTS 4.  2.  No evidence of hemorrhage.  3. There is mild mass effect on the left lateral ventricle with approximately 3 mm left right midline shift.  CT ANGIO Head/Neck:  IMPRESSION: 1. Acute infarct left MCA  territory on CT 2. Acute occlusion inferior division left M2  . 3. No other intracranial stenosis 4. No extracranial stenosis in the carotid or vertebral arteries. 5. These results were called by telephone at the time of interpretation on 04/21/2020 at 6:59 pm to provider JOSHUA LONG , who verbally acknowledged these results.  MRI Brain:  IMPRESSION: Acute infarct left MCA territory there is mild hemorrhage in the infarct which is not seen on recent CT earlier today.  Lindsey [name] was admitted to Inpatient Rehabilitation on 04/30/2020 and discharged home on 05/29/2020. She is receiving Home Health Therapy with [facility]. Lindsey [name] was ale to answer yes and no questions, her speech noted to be Language of confusion at times. This provider spoke with Lindsey [name], after assessment, she answered all provider questions and provider answered her questions. LindseyOrozco was unable to be in room with her mother Lindsey [name] due to being febrile, she was encouraged to F/U with her doctor as well, she verbalizes understanding.   Pain Inventory Average Pain 8 Pain Right Now 0 My pain is intermittent, sharp, burning, stabbing, tingling and aching  LOCATION OF PAIN    BOWEL Number of stools per week: 3-4 Oral laxative use No  Type of laxative  Enema or suppository use No  History of colostomy No  Incontinent No   BLADDER Normal In and out cath, frequency  Able  to self cath No  Bladder incontinence Yes  Frequent urination No  Leakage with coughing No  Difficulty starting stream No  Incomplete bladder emptying No    Mobility ability to climb steps?  no do you drive?  no use a wheelchair needs help with transfers - Function disabled: date disabled 04/2020 I need assistance with the following:  feeding, dressing, bathing, toileting, meal prep, household duties and shopping  Neuro/Psych trouble walking confusion depression  Prior Studies Any changes since last  visit?  no  Physicians involved in your care Any changes since last visit?  no   Family History  Problem Relation Age of Onset  . Breast cancer Neg Hx    Social History   Socioeconomic History  . Marital status: Single    Spouse name: Not on file  . Number of children: Not on file  . Years of education: Not on file  . Highest education level: Not on file  Occupational History  . Not on file  Tobacco Use  . Smoking status: Never Smoker  . Smokeless tobacco: Never Used  Vaping Use  . Vaping Use: Never used  Substance and Sexual Activity  . Alcohol use: Yes    Alcohol/week: 0.0 standard drinks    Comment: states once a wine/beer occassionally   . Drug use: No  . Sexual activity: Never  Other Topics Concern  . Not on file  Social History Narrative  . Not on file   Social Determinants of Health   Financial Resource Strain: Not on file  Food Insecurity: Not on file  Transportation Needs: Not on file  Physical Activity: Not on file  Stress: Not on file  Social Connections: Not on file   No past surgical history on file. Past Medical History:  Diagnosis Date  . Diverticulosis   . Hypertension   . Uterine prolapse    There were no vitals taken for this visit.  Opioid Risk Score:   Fall Risk Score:  `1  Depression screen PHQ 2/9  Depression screen [facility] 2/9 08/25/2020 06/15/2020  Decreased Interest 0 1  Down, Depressed, Hopeless 0 1  PHQ - 2 Score 0 2  Altered sleeping - 0  Tired, decreased energy - 1  Change in appetite - 0  Feeling bad or failure about yourself  - 1  Trouble concentrating - 0  Moving slowly or fidgety/restless - 3  Suicidal thoughts - 0  PHQ-9 Score - 7  Difficult doing work/chores - Somewhat difficult   Review of Systems  Musculoskeletal: Positive for gait problem.       Spasms  Neurological: Positive for weakness.  Psychiatric/Behavioral: Positive for confusion and dysphoric mood.  All other systems reviewed and are negative.       Objective:   Gen: no distress, normal appearing HEENT: oral mucosa pink and moist, NCAT Cardio: Reg rate Chest: normal effort, normal rate of breathing Abd: soft, non-distended Ext: no edema Skin: intact Neuro: Arrived in wheelchair  Musculoskeletal: Normal Muscle Bulk and Muscle Testing Reveals:  Upper Extremities: Right Upper Extremity: 0/5 strength.  Left Lower Extremity: Full ROM and Muscle Strength 5/5  Lower Extremities: Right Lower Extremity: 3/5 HF, KE, 0/5 DF and PF.  Left Lower Extremity: Full ROM and Muscle Strength 5/5 Severe expressive >receptive aphasia Psych: Pt's affect is appropriate. Pt is cooperative    Assessment & Plan:  1. Acute Ischemic Left MCA Stroke:Right Hemiplegia: Continue outpatient OT, PT, and SLP. She has had a HFU appointment with Neurology.  Provided new scripts for PT, OT, and SLP. Will communicate with her providers regarding positive reinforcement. She has made excellent gains in strength as well as cognition and speech! -Provided with new disability placard.   2. Essential Hypertension: BP is currently soft in office but upon review of BP log at home they have been quite elevated. Medications reviewed and she is taking Amlodipine 5mg  and lopressor. Check BP at home and bring log to follow-up appointment. Daughter will also bring in BP cuff to follow-up appointment and we will check along with our machine to make sure her cuff is working well. Provided with a list of foods that will help lower her BP.   3. Prevention of pressure injury: recommended offloading every hour. Lying prone and supine in bed at times to offload her sacrum.   4. Appetite: improved. Encouraged eating nutritious foods and discussed recommendations. May stop Mirtazepine. Provided with turmeric to add to meals. Turmeric to reduce inflammation--can be used in cooking or taken as a supplement.  Benefits of turmeric:  -Highly anti-inflammatory  -Increases  antioxidants  -Improves memory, attention, brain disease  -Lowers risk of heart disease  -May help prevent cancer  -Decreases pain  -Alleviates depression  -Delays aging and decreases risk of chronic disease  -Consume with black pepper to increase absorption   Turmeric Milk Recipe:  1 cup milk  1 tsp turmeric  1 tsp cinnamon  1 tsp grated ginger (optional)  Black pepper (boosts the anti-inflammatory properties of turmeric).  1 tsp honey   5) Depression: -Improved, can stop Mirtazepine -Counseled regarding making time for the activities she loves every day.  6) Pain well controlled: may use Gabapentin as needed.   7) HLD: advised wild caught salmon once per week and walnuts every day. On 40mg  statin daily.   All questions were encouraged and answered.  F/U in February.

## 2020-10-07 ENCOUNTER — Ambulatory Visit: Payer: Medicaid Other | Admitting: Occupational Therapy

## 2020-10-07 ENCOUNTER — Ambulatory Visit: Payer: Medicaid Other | Admitting: Physical Therapy

## 2020-10-07 DIAGNOSIS — G464 Cerebellar stroke syndrome: Secondary | ICD-10-CM | POA: Diagnosis not present

## 2020-10-08 DIAGNOSIS — G464 Cerebellar stroke syndrome: Secondary | ICD-10-CM | POA: Diagnosis not present

## 2020-10-09 DIAGNOSIS — G464 Cerebellar stroke syndrome: Secondary | ICD-10-CM | POA: Diagnosis not present

## 2020-10-10 DIAGNOSIS — G464 Cerebellar stroke syndrome: Secondary | ICD-10-CM | POA: Diagnosis not present

## 2020-10-11 DIAGNOSIS — G464 Cerebellar stroke syndrome: Secondary | ICD-10-CM | POA: Diagnosis not present

## 2020-10-12 DIAGNOSIS — G464 Cerebellar stroke syndrome: Secondary | ICD-10-CM | POA: Diagnosis not present

## 2020-10-13 DIAGNOSIS — G464 Cerebellar stroke syndrome: Secondary | ICD-10-CM | POA: Diagnosis not present

## 2020-10-14 ENCOUNTER — Encounter: Payer: Self-pay | Admitting: Occupational Therapy

## 2020-10-14 ENCOUNTER — Ambulatory Visit: Payer: Medicaid Other | Admitting: Physical Therapy

## 2020-10-14 ENCOUNTER — Telehealth: Payer: Self-pay | Admitting: Physical Medicine and Rehabilitation

## 2020-10-14 ENCOUNTER — Other Ambulatory Visit: Payer: Self-pay

## 2020-10-14 ENCOUNTER — Ambulatory Visit: Payer: Medicaid Other | Attending: Physical Medicine and Rehabilitation | Admitting: Occupational Therapy

## 2020-10-14 ENCOUNTER — Encounter: Payer: Self-pay | Admitting: Physical Therapy

## 2020-10-14 VITALS — BP 113/82 | HR 81

## 2020-10-14 DIAGNOSIS — R278 Other lack of coordination: Secondary | ICD-10-CM | POA: Insufficient documentation

## 2020-10-14 DIAGNOSIS — M6281 Muscle weakness (generalized): Secondary | ICD-10-CM

## 2020-10-14 DIAGNOSIS — R4701 Aphasia: Secondary | ICD-10-CM | POA: Diagnosis present

## 2020-10-14 DIAGNOSIS — M25631 Stiffness of right wrist, not elsewhere classified: Secondary | ICD-10-CM | POA: Diagnosis present

## 2020-10-14 DIAGNOSIS — I69851 Hemiplegia and hemiparesis following other cerebrovascular disease affecting right dominant side: Secondary | ICD-10-CM

## 2020-10-14 DIAGNOSIS — R262 Difficulty in walking, not elsewhere classified: Secondary | ICD-10-CM

## 2020-10-14 DIAGNOSIS — R41840 Attention and concentration deficit: Secondary | ICD-10-CM

## 2020-10-14 DIAGNOSIS — R2681 Unsteadiness on feet: Secondary | ICD-10-CM | POA: Diagnosis not present

## 2020-10-14 DIAGNOSIS — G464 Cerebellar stroke syndrome: Secondary | ICD-10-CM | POA: Diagnosis not present

## 2020-10-14 NOTE — Therapy (Signed)
[facility] [address] [place], [place], [address] Phone: [phone]   Fax:  [phone]  Occupational Therapy Treatment  Patient Details  Name: Lindsey [name] MRN: [id number] Date of Birth: 1956-03-18 Referring Provider (OT): Sula Soda, MD   Encounter Date: 10/14/2020   OT End of Session - 10/14/20 1450    Visit Number 6    Number of Visits 9    Date for OT Re-Evaluation 11/12/20    Authorization Type [employer] - Medicaid    Authorization Time Period VL: 27 PT/OT/ST combined    OT Start Time 1450    OT Stop Time 1530    OT Time Calculation (min) 40 min    Activity Tolerance Patient tolerated treatment well    Behavior During Therapy [facility] for tasks assessed/performed;Restless           Past Medical History:  Diagnosis Date  . Diverticulosis   . Hypertension   . Uterine prolapse     History reviewed. No pertinent surgical history.  There were no vitals filed for this visit.   Subjective Assessment - 10/14/20 1450    Subjective  Pt denies any pain and says no changes.    Patient is accompanied by: Family member   daughter   Pertinent History PMH significant for HTN, EtOH abuse, diverticulosis, CVA    Limitations Fall Risk. Not Driving. Aphasia. Sensory deficits RUE. Apraxia.    Patient Stated Goals get arm working better (right)    Currently in Pain? No/denies                        OT Treatments/Exercises (OP) - 10/14/20 1534      Neurological Re-education Exercises   Other Exercises 1 wrist extension stretching with weight bearing in mat with RUE. Pt benefitted from PROM for RUE wrist, elbow, shoulder. Pt with increased spasticity this day upon arrival. Inconsistent presentation session to session.    Other Exercises 2 UE Ranger in sitting with AAROM with max to total assistance for all movements    Other Weight-Bearing Exercises 1 weight bearing in RUE to right on mat while reaching to  floor to grab cones x 6. Repeated 2 times. Pt completed weight bearing forward on color pebbles for RUE for pushing up while standing x 10. Diagonals x 10 with LUE with weight bearing in RUE.                    OT Short Term Goals - 10/14/20 1452      OT SHORT TERM GOAL #1   Title Pt and caregiver will be independent with HEP12/30/21    Baseline not issued yet    Time 4    Period Weeks    Status New    Target Date 10/01/20      OT SHORT TERM GOAL #2   Title Pt and caregiver will verbalize understanding of sensory strategies for safety regarding RUE with sensory deficits.    Baseline not reviewed/issued    Time 4    Period Weeks    Status New      OT SHORT TERM GOAL #3   Title Pt will complete table top scanning task with supervision and 75% accuracy.    Baseline right inatention and/or field deficits noted at eval    Time 4    Period Weeks    Status New      OT SHORT TERM GOAL #4   Title  Pt will complete light snack or beverage prep with set up assistance of materials that are not in reach with supervision and adapted strategies or equipment PRN    Baseline total A    Time 4    Period Weeks    Status New      OT SHORT TERM GOAL #5   Title Pt will don and doff shoes with supervision for increasing independence with basic ADLs    Baseline max A    Time 4    Period Weeks    Status New      OT SHORT TERM GOAL #6   Title Pt and caregiver will verbalize and demonstrate understanding of splints and/or orthoses PRN for right hemiplegia    Baseline none issued    Time 4    Period Weeks    Status New             OT Long Term Goals - 09/03/20 1757      OT LONG TERM GOAL #1   Title Pt and caregiver will be independent with updated HEP 10/29/20    Baseline not issued    Time 8    Period Weeks    Status New    Target Date 10/29/20      OT LONG TERM GOAL #2   Title Pt will perform UB dressing with min A for increase in independence with ADLs    Baseline max  A    Time 8    Period Weeks    Status New      OT LONG TERM GOAL #3   Title Pt will perform table top scanning task with 100% accuracy    Baseline inattention to right    Time 8    Period Weeks    Status New      OT LONG TERM GOAL #4   Title Pt will achieve PROM of RUE shoulder flexion of 75 degrees with pain no greater than 4/10    Baseline 60 degrees pain 5/10    Time 8    Period Weeks    Status New      OT LONG TERM GOAL #5   Title Pt will report assisting with at least one home management or cooking activity per day in order to increase independence and decrease caregiver burden    Baseline not currently assisting    Time 8    Period Weeks    Status New                 Plan - 10/14/20 1532    Clinical Impression Statement Pt continues to have significant spasticity in RUE. Pt continues to report success with resting hand splint for RUE. Pt with no functional use of RUE.    OT Occupational Profile and History Problem Focused Assessment - Including review of records relating to presenting problem    Occupational performance deficits (Please refer to evaluation for details): ADL's;IADL's;Leisure    Body Structure / Function / Physical Skills ADL;Decreased knowledge of use of DME;Strength;Gait;GMC;Tone;Pain;Dexterity;Balance;UE functional use;Proprioception;ROM;IADL;Vision;Sensation;Coordination;FMC    Cognitive Skills Attention;Energy/Drive;Perception;Understand;Sequencing;Problem Solve    Rehab Potential Good    Clinical Decision Making Limited treatment options, no task modification necessary    Comorbidities Affecting Occupational Performance: None    Modification or Assistance to Complete Evaluation  No modification of tasks or assist necessary to complete eval    OT Frequency 1x / week    OT Duration 8 weeks   +eval   OT Treatment/Interventions  Self-care/ADL training;Moist Heat;DME and/or AE instruction;Balance training;Aquatic Therapy;Therapeutic  activities;Cognitive remediation/compensation;Therapeutic exercise;Visual/perceptual remediation/compensation;Passive range of motion;Neuromuscular education;Functional Mobility Training;Cryotherapy;Electrical Stimulation;Energy conservation;Manual Therapy;Patient/family education    Plan NMR RUE    OT Home Exercise Plan Self-PROM seated shoulder    Recommended Other Services Pt receiving ST and PT    Consulted and Agree with Plan of Care Patient;Family member/caregiver    Family Member Consulted daughter           Patient will benefit from skilled therapeutic intervention in order to improve the following deficits and impairments:   Body Structure / Function / Physical Skills: ADL,Decreased knowledge of use of DME,Strength,Gait,GMC,Tone,Pain,Dexterity,Balance,UE functional use,Proprioception,ROM,IADL,Vision,Sensation,Coordination,FMC Cognitive Skills: Attention,Energy/Drive,Perception,Understand,Sequencing,Problem Solve     Visit Diagnosis: Unsteadiness on feet  Attention and concentration deficit  Muscle weakness (generalized)  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)  Stiffness of right wrist, not elsewhere classified    Problem List Patient Active Problem List   Diagnosis Date Noted  . Anemia 05/31/2020  . Acute ischemic left MCA stroke (HCC) 04/30/2020  . Right hemiplegia (HCC) 04/30/2020  . CVA (cerebral vascular accident) (HCC) 04/21/2020  . Unspecified atrial fibrillation (HCC) 04/21/2020  . Essential hypertension 04/21/2020  . Alcohol abuse 04/21/2020    Junious Dresser MOT, OTR/L  10/14/2020, 3:39 PM  [facility] [address] [place], [place], [address] Phone: [phone]   Fax:  [phone]  Name: Yeimy Brabant MRN: [id number] Date of Birth: Feb 09, 1956

## 2020-10-14 NOTE — Telephone Encounter (Signed)
Patient need a right toe cap for her foot it is a leather, goes over her shoe.  Helps with the friction.  If it can be sent over to [facility] clinic, like the brace.  Thank you

## 2020-10-15 DIAGNOSIS — G464 Cerebellar stroke syndrome: Secondary | ICD-10-CM | POA: Diagnosis not present

## 2020-10-15 NOTE — Therapy (Signed)
[facility] [address] [place], [place], [address] Phone: [phone]   Fax:  [phone]  Physical Therapy Treatment  Patient Details  Name: Lindsey [name] MRN: [id number] Date of Birth: 1956/09/30 Referring Provider (PT): Izora Ribas, MD   Encounter Date: 10/14/2020   PT End of Session - 10/14/20 1616    Visit Number 10    Number of Visits 17    Date for PT Re-Evaluation --   POC for 10 weeks total   Authorization Type [employer] Medicaid (VL: 27 between OT/PT/ST)    PT Start Time 1528    PT Stop Time 1611    PT Time Calculation (min) 43 min    Equipment Utilized During Treatment Gait belt    Activity Tolerance Patient tolerated treatment well    Behavior During Therapy WFL for tasks assessed/performed           Past Medical History:  Diagnosis Date  . Diverticulosis   . Hypertension   . Uterine prolapse     History reviewed. No pertinent surgical history.  Vitals:   10/14/20 1553  BP: 113/82  Pulse: 81     Subjective Assessment - 10/14/20 1529    Subjective No changes since she was last here. Reports she has been walking at home, about 2-3 times per week.    Patient is accompained by: --   Caregiver   Pertinent History HTN, Alcohol Abuse    Limitations Standing;Walking    Patient Stated Goals Be able to walk    Currently in Pain? No/denies                            10/14/20 0001  Transfers  Transfers Sit to Stand;Stand to Sit  Sit to Stand 4: Min assist  Sit to Stand Details Tactile cues for weight shifting;Tactile cues for placement;Verbal cues for sequencing;Verbal cues for safe use of DME/AE;Verbal cues for technique;Manual facilitation for weight shifting;Manual facilitation for placement  Sit to Stand Details (indicate cue type and reason) cues for proper UE placement and for foot placement. PT providing assistance for putting R hand in orthosis in standing  Stand to Sit  4: Min assist  Stand to Sit Details (indicate cue type and reason) Manual facilitation for placement;Verbal cues for sequencing  Stand to Sit Details for controlled descent and to remove R hand from hand orthosis prior to sitting  Number of Reps 1 set;Other reps (comment) (5 reps from mat table)  Ambulation/Gait  Ambulation/Gait Yes  Ambulation/Gait Assistance 4: Min guard;4: Min assist  Ambulation/Gait Assistance Details with R AFO, used blue shoe cover for last bout of gait. ambulated only 20' for first bout (pt requesting to stop due to RW feeling too wobbly, PT changed out RW and pt able to ambulate farther for last 2 bouts of gait), min A towards end of bout due to RLE beginning to fatigue and pt with difficulty with foot clearance  Ambulation Distance (Feet) 20 Feet (x1, 95 x 1, 85 x 1)  Assistive device Rolling walker (R AFO, R hand orthosis)  Gait Pattern Step-through pattern;Decreased stance time - right;Decreased step length - left;Decreased dorsiflexion - right;Decreased weight shift to right  Ambulation Surface Level;Indoor  Gait Comments OT present during gait to assess RUE on hand orthosis due to hand slipping and pt reporting discomfort, OT providing a foam pad to prevent pt's hand from slipping with pt reporting it felt much better  PT Education - 10/14/20 1615    Education Details address and phone number for [facility] to get toe cap for R sneaker    Person(s) Educated Patient;Caregiver(s)    Methods Explanation;Handout    Comprehension Verbalized understanding            PT Short Term Goals - 10/15/20 0952      PT SHORT TERM GOAL #1   Title Patient and caregiver will report completing ambulation 1-2x/daily within the home (All STGs Due at Visit 9)    Baseline pt reports ambulating at home but just 2-3x a week    Time 2    Period Weeks    Status Not Met      PT SHORT TERM GOAL #2   Title Patient will be able to ambulate with RW and Min A  x 175 ft to demonstrate improved household mobility    Baseline 140 ft Min A RW, 95' with RW with min A    Time 2    Period Weeks    Status Not Met             PT Long Term Goals - 09/18/20 1135      PT LONG TERM GOAL #1   Title Patient will be independent with final HEP and walking program within the home with caregiver assistance(All LTGS Due at Visit 17)    Baseline conitnue to progress    Time 10    Period Weeks    Status Revised      PT LONG TERM GOAL #2   Title Patient will demo ability to complete sit <> stand with CGA and LRAD to demonstrate improved functional mobility    Baseline Min A and RW    Time 10    Period Weeks    Status On-going      PT LONG TERM GOAL #3   Title Patient wll demo ability to complete all bed mobility with CGA to demonstrate improved funcitonal mobility and reduced caregiver assistance    Baseline Mod A; Min a on 12/17    Time 10    Period Weeks    Status On-going      PT LONG TERM GOAL #4   Title Patient will demo ability to ambulate > 200 ft with LRAD and R AFO donned with CGA for improved household mobility    Baseline 140 ft with RW and AFO, Min A    Time 10    Period Weeks    Status Revised                 Plan - 10/15/20 0955    Clinical Impression Statement Began to check STGs - pt did not meet pt's 2 STGs. Pt reports ambulating at home but just 2-3x a week. Pt able to ambulate a max of 95' today with RW and R AFO with min guard/min A (towards end of bout when more fatigued). Provided info to pt's caregiver to get a R toe cap put on pt's shoe from [employer] due to improved foot clearance with use of blue shoe cover during gait. Will continue to progress towards LTGs.    Personal Factors and Comorbidities Comorbidity 2    Comorbidities HTN, Alcohol Abuse, Diverticulosis    Examination-Activity Limitations Bed Mobility;Bathing;Sit;Transfers;Dressing;Stairs;Stand;Locomotion Level;Toileting    Examination-Participation  Restrictions Occupation;Community Activity;Driving    Stability/Clinical Decision Making Evolving/Moderate complexity    Rehab Potential Good    PT Frequency 1x / week    PT Duration  2 weeks   followed by 1x/week for 8 weeks beginning January (MCD Visits Restart)   PT Treatment/Interventions ADLs/Self Care Home Management;Electrical Stimulation;DME Instruction;Gait training;Stair training;Therapeutic activities;Functional mobility training;Therapeutic exercise;Balance training;Neuromuscular re-education;Patient/family education;Orthotic Fit/Training;Wheelchair mobility training;Manual techniques;Passive range of motion    PT Next Visit Plan did they get toe cap from [employer]? Continue gait training working on improved weight shift, sit <> stand training, and activites to promote improved weight shift to RLE.    PT Home Exercise Plan Access Code: [email or web address]    Consulted and Agree with Plan of Care Patient;Family member/caregiver    Family Member Consulted Daughter           Patient will benefit from skilled therapeutic intervention in order to improve the following deficits and impairments:  Abnormal gait,Decreased balance,Decreased mobility,Decreased endurance,Difficulty walking,Impaired sensation,Pain,Impaired UE functional use,Decreased strength,Decreased safety awareness,Decreased knowledge of use of DME,Decreased activity tolerance,Decreased range of motion,Impaired tone,Improper body mechanics  Visit Diagnosis: Unsteadiness on feet  Muscle weakness (generalized)  Difficulty in walking, not elsewhere classified  Hemiplegia and hemiparesis following other cerebrovascular disease affecting right dominant side ([place])  Other lack of coordination     Problem List Patient Active Problem List   Diagnosis Date Noted  . Anemia 05/31/2020  . Acute ischemic left MCA stroke ([place]) 04/30/2020  . Right hemiplegia ([place]) 04/30/2020  . CVA (cerebral vascular accident) ([place]) 04/21/2020  .  Unspecified atrial fibrillation ([place]) 04/21/2020  . Essential hypertension 04/21/2020  . Alcohol abuse 04/21/2020    Arliss Journey, PT, DPT 10/15/2020, 9:58 AM  [facility] [address] [place], [place], [address] Phone: [phone]   Fax:  [phone]  Name: Lindsey [name] MRN: [id number] Date of Birth: 12/22/1955

## 2020-10-16 DIAGNOSIS — G464 Cerebellar stroke syndrome: Secondary | ICD-10-CM | POA: Diagnosis not present

## 2020-10-17 DIAGNOSIS — G464 Cerebellar stroke syndrome: Secondary | ICD-10-CM | POA: Diagnosis not present

## 2020-10-18 DIAGNOSIS — G464 Cerebellar stroke syndrome: Secondary | ICD-10-CM | POA: Diagnosis not present

## 2020-10-19 DIAGNOSIS — G464 Cerebellar stroke syndrome: Secondary | ICD-10-CM | POA: Diagnosis not present

## 2020-10-20 DIAGNOSIS — G464 Cerebellar stroke syndrome: Secondary | ICD-10-CM | POA: Diagnosis not present

## 2020-10-21 ENCOUNTER — Ambulatory Visit: Payer: Medicaid Other | Admitting: Occupational Therapy

## 2020-10-21 ENCOUNTER — Encounter: Payer: Self-pay | Admitting: Physical Therapy

## 2020-10-21 ENCOUNTER — Ambulatory Visit: Payer: Medicaid Other | Admitting: Physical Therapy

## 2020-10-21 ENCOUNTER — Other Ambulatory Visit: Payer: Self-pay

## 2020-10-21 DIAGNOSIS — M6281 Muscle weakness (generalized): Secondary | ICD-10-CM

## 2020-10-21 DIAGNOSIS — I69851 Hemiplegia and hemiparesis following other cerebrovascular disease affecting right dominant side: Secondary | ICD-10-CM

## 2020-10-21 DIAGNOSIS — R2681 Unsteadiness on feet: Secondary | ICD-10-CM | POA: Diagnosis not present

## 2020-10-21 DIAGNOSIS — R278 Other lack of coordination: Secondary | ICD-10-CM

## 2020-10-21 DIAGNOSIS — R262 Difficulty in walking, not elsewhere classified: Secondary | ICD-10-CM

## 2020-10-21 DIAGNOSIS — G464 Cerebellar stroke syndrome: Secondary | ICD-10-CM | POA: Diagnosis not present

## 2020-10-21 NOTE — Patient Instructions (Signed)
Access Code: [email or web address] URL: [email or web address] Date: 10/21/2020 Prepared by: Sherlie Ban  Exercises Supine Heel Slide - 1 x daily - 5 x weekly - 1 sets - 10 reps Supine Bridge - 2 x daily - 5 x weekly - 1 sets - 10 reps Seated Long Arc Quad - 2 x daily - 5 x weekly - 2 sets - 10 reps Supine Ankle Dorsiflexion Stretch with Caregiver - 2 x daily - 5 x weekly - 3 sets - 20 hold

## 2020-10-21 NOTE — Therapy (Signed)
[facility] [address] [place], [place], [address] Phone: [phone]   Fax:  [phone]  Physical Therapy Treatment  Patient Details  Name: Lindsey [name] MRN: [id number] Date of Birth: 03/29/1956 Referring Provider (PT): Krutika P Raulkar, MD   Encounter Date: 10/21/2020   PT End of Session - 10/21/20 1629    Visit Number 11    Number of Visits 17    Date for PT Re-Evaluation --   POC for 10 weeks total   Authorization Type [employer] Medicaid (VL: 27 between OT/PT/ST)    PT Start Time 1531    PT Stop Time 1620    PT Time Calculation (min) 49 min    Equipment Utilized During Treatment Gait belt    Activity Tolerance Patient tolerated treatment well    Behavior During Therapy WFL for tasks assessed/performed           Past Medical History:  Diagnosis Date  . Diverticulosis   . Hypertension   . Uterine prolapse     History reviewed. No pertinent surgical history.  There were no vitals filed for this visit.   Subjective Assessment - 10/21/20 1534    Subjective No changes since she was last here. R hand orthosis has been feeling better. Trying to walk everyday. Going to try to drop her shoe to get the toe cap on tomorrow.    Patient is accompained by: --   Caregiver   Pertinent History HTN, Alcohol Abuse    Limitations Standing;Walking    Patient Stated Goals Be able to walk    Currently in Pain? No/denies                             [facility] Adult PT Treatment/Exercise - 10/21/20 0001      Bed Mobility   Bed Mobility Sit to Supine;Supine to Sit;Rolling Right;Sitting - Scoot to Edge of Bed    Supine to Sit Minimal Assistance - Patient > 75%    Sit to Supine Minimal Assistance - Patient > 75%   to assist with putting RLE on the bed     Transfers   Transfers Sit to Stand;Stand to Sit    Sit to Stand 4: Min assist    Sit to Stand Details Tactile cues for weight shifting;Tactile cues for  placement;Verbal cues for sequencing;Verbal cues for safe use of DME/AE;Verbal cues for technique;Manual facilitation for weight shifting;Manual facilitation for placement    Sit to Stand Details (indicate cue type and reason) cues for proper foot placement prior to standing and for UE placement. once in standing, pt needing assist to get RUE in R hand orthosis, incr time needed today due to incr spasticity, manual and verbal cues to weight shift to R and taking deep breaths to help relax    Stand to Sit 4: Min assist    Stand to Sit Details (indicate cue type and reason) Manual facilitation for placement;Verbal cues for sequencing    Stand to Sit Details cues to reach behind    Comments performed approx. 5 reps of sit <> stand throughout session      Ambulation/Gait   Ambulation/Gait Yes    Ambulation/Gait Assistance 4: Min guard;4: Min assist    Ambulation/Gait Assistance Details with R AFO and R hand orthosis, beginning with min guard and downgrading to min A to help clear RLE when more fatigued, also needing assist for turning    Ambulation Distance (  Feet) 115 Feet   x1   Assistive device Rolling walker    Gait Pattern Step-through pattern;Decreased stance time - right;Decreased step length - left;Decreased dorsiflexion - right;Decreased weight shift to right    Ambulation Surface Level;Indoor      Therapeutic Activites    Therapeutic Activities Other Therapeutic Activities    Other Therapeutic Activities pt's daughter asking about incr swelling to pt's R ankle, PT assessing with pt with mild swelling but no pain/discoloration. discussed reasoning why R ankle is more swollen after stroke. discussed elevating R foot when pt is sitting to help with swelling and demonstrating to pt's daughter. both verbalized understanding.due to pt with no active ankle DF/PF, discussed performing LAQs with RLE when sitting fown throughout the day             Access Code: [email or web address] URL:  [email or web address] Date: 10/21/2020 Prepared by: Chloe Gilgannon  Bolded are new additions to pt's HEP, with assist from pt's daughter   Exercises Supine Heel Slide - 1 x daily - 5 x weekly - 1 sets - 10 reps Supine Bridge - 2 x daily - 5 x weekly - 1 sets - 10 reps - with therapist assisting to help keep RLE in proper positioning  Seated Long Arc Quad - 2 x daily - 5 x weekly - 2 sets - 10 reps Supine Ankle Dorsiflexion Stretch with Caregiver - 2 x daily - 5 x weekly - 3 sets - 20 hold        PT Education - 10/21/20 1629    Education Details new additions to HEP, see TA    Person(s) Educated Patient;Child(ren)    Methods Explanation;Demonstration;Handout    Comprehension Verbalized understanding;Returned demonstration            PT Short Term Goals - 10/15/20 0952      PT SHORT TERM GOAL #1   Title Patient and caregiver will report completing ambulation 1-2x/daily within the home (All STGs Due at Visit 9)    Baseline pt reports ambulating at home but just 2-3x a week    Time 2    Period Weeks    Status Not Met      PT SHORT TERM GOAL #2   Title Patient will be able to ambulate with RW and Min A x 175 ft to demonstrate improved household mobility    Baseline 140 ft Min A RW, 95' with RW with min A    Time 2    Period Weeks    Status Not Met             PT Long Term Goals - 09/18/20 1135      PT LONG TERM GOAL #1   Title Patient will be independent with final HEP and walking program within the home with caregiver assistance(All LTGS Due at Visit 17)    Baseline conitnue to progress    Time 10    Period Weeks    Status Revised      PT LONG TERM GOAL #2   Title Patient will demo ability to complete sit <> stand with CGA and LRAD to demonstrate improved functional mobility    Baseline Min A and RW    Time 10    Period Weeks    Status On-going      PT LONG TERM GOAL #3   Title Patient wll demo ability to complete all bed mobility with CGA  to demonstrate improved funcitonal mobility and reduced caregiver assistance      Baseline Mod A; Min a on 12/17    Time 10    Period Weeks    Status On-going      PT LONG TERM GOAL #4   Title Patient will demo ability to ambulate > 200 ft with LRAD and R AFO donned with CGA for improved household mobility    Baseline 140 ft with RW and AFO, Min A    Time 10    Period Weeks    Status Revised                 Plan - 10/21/20 1641    Clinical Impression Statement Pt ambulated the farthest distance today at 115' with RW, R AFO, and R hand orthosis - beginning with min guard and downgrading to min A to help clear RLE when fatigued. Pt with incr spasticity in RUE today making it difficult and taking incr time to put R hand in orthosis. Will discuss with OT. Added additional exercises to HEP to include R ankle DF stretching (with daughter assist) and RLE strengthening. Will continue to progress towards LTGs.    Personal Factors and Comorbidities Comorbidity 2    Comorbidities HTN, Alcohol Abuse, Diverticulosis    Examination-Activity Limitations Bed Mobility;Bathing;Sit;Transfers;Dressing;Stairs;Stand;Locomotion Level;Toileting    Examination-Participation Restrictions Occupation;Community Activity;Driving    Stability/Clinical Decision Making Evolving/Moderate complexity    Rehab Potential Good    PT Frequency 1x / week    PT Duration 2 weeks   followed by 1x/week for 8 weeks beginning January (MCD Visits Restart)   PT Treatment/Interventions ADLs/Self Care Home Management;Electrical Stimulation;DME Instruction;Gait training;Stair training;Therapeutic activities;Functional mobility training;Therapeutic exercise;Balance training;Neuromuscular re-education;Patient/family education;Orthotic Fit/Training;Wheelchair mobility training;Manual techniques;Passive range of motion    PT Next Visit Plan did they get toe cap from [employer]? Continue gait training working on improved weight shift, sit <>  stand training, and activites to promote improved weight shift to RLE. how is HEP?    PT Home Exercise Plan Access Code: [email or web address]    Consulted and Agree with Plan of Care Patient;Family member/caregiver    Family Member Consulted Daughter           Patient will benefit from skilled therapeutic intervention in order to improve the following deficits and impairments:  Abnormal gait,Decreased balance,Decreased mobility,Decreased endurance,Difficulty walking,Impaired sensation,Pain,Impaired UE functional use,Decreased strength,Decreased safety awareness,Decreased knowledge of use of DME,Decreased activity tolerance,Decreased range of motion,Impaired tone,Improper body mechanics  Visit Diagnosis: Unsteadiness on feet  Muscle weakness (generalized)  Difficulty in walking, not elsewhere classified  Other lack of coordination  Hemiplegia and hemiparesis following other cerebrovascular disease affecting right dominant side [facility])     Problem List Patient Active Problem List   Diagnosis Date Noted  . Anemia 05/31/2020  . Acute ischemic left MCA stroke ([place]) 04/30/2020  . Right hemiplegia ([place]) 04/30/2020  . CVA (cerebral vascular accident) ([place]) 04/21/2020  . Unspecified atrial fibrillation ([place]) 04/21/2020  . Essential hypertension 04/21/2020  . Alcohol abuse 04/21/2020    Arliss Journey , PT, DPT  10/21/2020, 4:45 PM  [facility] [address] [place], [place], [address] Phone: [phone]   Fax:  [phone]  Name: Lindsey [name] MRN: [id number] Date of Birth: 1956/05/18

## 2020-10-22 DIAGNOSIS — G464 Cerebellar stroke syndrome: Secondary | ICD-10-CM | POA: Diagnosis not present

## 2020-10-23 DIAGNOSIS — G464 Cerebellar stroke syndrome: Secondary | ICD-10-CM | POA: Diagnosis not present

## 2020-10-24 DIAGNOSIS — G464 Cerebellar stroke syndrome: Secondary | ICD-10-CM | POA: Diagnosis not present

## 2020-10-25 DIAGNOSIS — G464 Cerebellar stroke syndrome: Secondary | ICD-10-CM | POA: Diagnosis not present

## 2020-10-26 DIAGNOSIS — G464 Cerebellar stroke syndrome: Secondary | ICD-10-CM | POA: Diagnosis not present

## 2020-10-27 DIAGNOSIS — G464 Cerebellar stroke syndrome: Secondary | ICD-10-CM | POA: Diagnosis not present

## 2020-10-28 ENCOUNTER — Ambulatory Visit: Payer: Medicaid Other | Admitting: Speech Pathology

## 2020-10-28 ENCOUNTER — Encounter: Payer: Self-pay | Admitting: Speech Pathology

## 2020-10-28 ENCOUNTER — Encounter: Payer: Self-pay | Admitting: Occupational Therapy

## 2020-10-28 ENCOUNTER — Other Ambulatory Visit: Payer: Self-pay

## 2020-10-28 ENCOUNTER — Ambulatory Visit: Payer: Medicaid Other | Admitting: Occupational Therapy

## 2020-10-28 ENCOUNTER — Ambulatory Visit: Payer: Medicaid Other

## 2020-10-28 DIAGNOSIS — M25631 Stiffness of right wrist, not elsewhere classified: Secondary | ICD-10-CM

## 2020-10-28 DIAGNOSIS — M6281 Muscle weakness (generalized): Secondary | ICD-10-CM

## 2020-10-28 DIAGNOSIS — R4701 Aphasia: Secondary | ICD-10-CM

## 2020-10-28 DIAGNOSIS — R41840 Attention and concentration deficit: Secondary | ICD-10-CM

## 2020-10-28 DIAGNOSIS — R2681 Unsteadiness on feet: Secondary | ICD-10-CM

## 2020-10-28 DIAGNOSIS — R262 Difficulty in walking, not elsewhere classified: Secondary | ICD-10-CM

## 2020-10-28 DIAGNOSIS — I69851 Hemiplegia and hemiparesis following other cerebrovascular disease affecting right dominant side: Secondary | ICD-10-CM

## 2020-10-28 DIAGNOSIS — G464 Cerebellar stroke syndrome: Secondary | ICD-10-CM | POA: Diagnosis not present

## 2020-10-28 DIAGNOSIS — R278 Other lack of coordination: Secondary | ICD-10-CM

## 2020-10-28 NOTE — Therapy (Signed)
[facility] [address] [place], [place], [address] Phone: [phone]   Fax:  [phone]  Speech Language Pathology Treatment  Patient Details  Name: Lindsey [name] MRN: [id number] Date of Birth: 10/01/1956 Referring Provider (SLP): Dr. Sula Soda   Encounter Date: 10/28/2020   End of Session - 10/28/20 1500    Visit Number 5    Number of Visits 25    Date for SLP Re-Evaluation 11/13/20    Authorization Type UHC medicaid    SLP Start Time 1403    SLP Stop Time  1445    SLP Time Calculation (min) 42 min    Activity Tolerance Patient tolerated treatment well           Past Medical History:  Diagnosis Date  . Diverticulosis   . Hypertension   . Uterine prolapse     History reviewed. No pertinent surgical history.  There were no vitals filed for this visit.   Subjective Assessment - 10/28/20 1411    Subjective "Good"    Patient is accompained by: Family member   [name], daughter   Currently in Pain? No/denies                 ADULT SLP TREATMENT - 10/28/20 1413      General Information   Behavior/Cognition Alert;Cooperative;Pleasant mood      Treatment Provided   Treatment provided Cognitive-Linquistic      Cognitive-Linquistic Treatment   Treatment focused on Aphasia;Patient/family/caregiver education    Skilled Treatment Did not bring in communication book. With written choices of 2-4 words, Lindsey [name] accurately chose her preferences for food, drinks, restaurants, stores. Demonstrated to daughter how to use short written cues to improve communication. [name] is bringing 3 outfits down stairs for Lindsey [name] to choose. We discussed using 3 written choices on white board for outfits to ease caregiver burden. they will try this for homework. As Adrinne is accurate in choices without photos, generated communication sheet with frequent people, places, activities. Trained Research scientist (physical sciences) and [place] in use  of communication sheet to augment verbal communication.      Assessment / Recommendations / Plan   Plan Continue with current plan of care      Progression Toward Goals   Progression toward goals Progressing toward goals            SLP Education - 10/28/20 1455    Education Details low tech AAC,multi modal communication    Person(s) Educated Patient;Child(ren)    Methods Explanation;Demonstration    Comprehension Verbalized understanding;Returned demonstration;Verbal cues required;Need further instruction            SLP Short Term Goals - 10/28/20 1458      SLP SHORT TERM GOAL #1   Title Family will use written choices of 2 or 3 to ID pt's wants/needs with occasional min A over 2 weeks outsdie of therapy    Baseline No written cues or choices being used    Time 1    Period Weeks    Status On-going      SLP SHORT TERM GOAL #2   Title Pt will use multimodal communication/AAC to communicate preferences for meal/snack 3x over 2 weeks with occasional min to mod A from family    Baseline no alternative communication strategies in place    Time 1    Period Weeks    Status On-going      SLP SHORT TERM GOAL #3   Title Pt will use multimodal/AAC to ID  family members she wants to communicate with or about with occasional min to mod A from family.    Time 1    Period Weeks    Status On-going      SLP SHORT TERM GOAL #4   Title Pt will name 3 items in simple personally relevant category using multimodal communication with occasional min A    Baseline no items in simple category    Time 1    Period Weeks    Status On-going      SLP SHORT TERM GOAL #5   Title Pt will ID 3 paraphasias with usual min to mod A over 4 sessions    Baseline no awareness of paraphasias or perseverations    Time 4    Period Weeks    Status On-going            SLP Long Term Goals - 10/28/20 1459      SLP LONG TERM GOAL #1   Title Pt will use multimodal communication to answer 5 preferences or  answer 5 questions accurately at thte word level with min A from caregivers /ST over 3 sessions    Baseline other than yes/no, pt is not indicating preferences or answering basic questions accurately    Time 9    Period Weeks    Status On-going      SLP LONG TERM GOAL #2   Title Pt will ID 5 paraphasias in a session with usual min A and self correct with frequent mod A in naming task or conversation over 2 sessions    Baseline pt unaware of paraphasias and perseverations    Time 9    Period Weeks    Status On-going      SLP LONG TERM GOAL #3   Title Caregivers will use written or photo cues at 2-3 word level to facilitate 3 turns in a conversation with pt and provide assistance in pt following a conversation topic with occasional min A over 2 sessions.    Baseline no multimodal supports in place for conversation success    Time 9    Period Weeks    Status On-going      SLP LONG TERM GOAL #4   Title Pt will name 5 items in personally relevant category using multmodal communciation with occasional min A    Baseline no items in simple category    Time 10    Period Weeks    Status On-going      SLP LONG TERM GOAL #5   Title Pt will attend aphasia conversation group (pending available technology) and family will ID 3 online/community support for persons with aphasia and caregivers    Baseline no community support identified    Time 9    Period Weeks    Status On-going            Plan - 10/28/20 1456    Clinical Impression Statement Moderate to severe aphasia persists. Pt successful in using written choices and low tech AAC at 2-4 word level. Ongoing training for use of AAC and multimodal communication. Continue skilled ST to maximize communication and reduce caregiver burden    Speech Therapy Frequency 2x / week    Duration 12 weeks    Treatment/Interventions Compensatory strategies;Patient/family education;Functional tasks;Cueing hierarchy;Environmental controls;Cognitive  reorganization;Multimodal communcation approach;Language facilitation;Compensatory techniques;Internal/external aids;SLP instruction and feedback;Other (comment)    Potential to Achieve Goals Good    Potential Considerations Severity of impairments  Patient will benefit from skilled therapeutic intervention in order to improve the following deficits and impairments:   Aphasia    Problem List Patient Active Problem List   Diagnosis Date Noted  . Anemia 05/31/2020  . Acute ischemic left MCA stroke (HCC) 04/30/2020  . Right hemiplegia (HCC) 04/30/2020  . CVA (cerebral vascular accident) (HCC) 04/21/2020  . Unspecified atrial fibrillation (HCC) 04/21/2020  . Essential hypertension 04/21/2020  . Alcohol abuse 04/21/2020    Arriyah Madej, Radene Journey MS, CCC-SLP 10/28/2020, 3:01 PM  [facility] [address] [place], [place], [address] Phone: [phone]   Fax:  [phone]   Name: Kalyani Maeda MRN: [id number] Date of Birth: 07-10-1956

## 2020-10-28 NOTE — Therapy (Signed)
[facility] [address] [place], [place], [address] Phone: [phone]   Fax:  [phone]  Occupational Therapy Treatment  Patient Details  Name: Donya Hitch MRN: [id number] Date of Birth: [birth year] Referring Provider (OT): Sula Soda, MD   Encounter Date: 10/28/2020   OT End of Session - 10/28/20 1532    Visit Number 7    Number of Visits 9    Date for OT Re-Evaluation 11/12/20    Authorization Type UHC - Medicaid    Authorization Time Period VL: 27 PT/OT/ST combined Week 6 of 8 (1/26)    OT Start Time 1531    OT Stop Time 1615    OT Time Calculation (min) 44 min    Activity Tolerance Patient tolerated treatment well    Behavior During Therapy [facility] for tasks assessed/performed;Restless           Past Medical History:  Diagnosis Date  . Diverticulosis   . Hypertension   . Uterine prolapse     History reviewed. No pertinent surgical history.  There were no vitals filed for this visit.   Subjective Assessment - 10/28/20 1532    Subjective  Pt denies any pain and says there are no changes.    Patient is accompanied by: Family member   daughter   Pertinent History PMH significant for HTN, EtOH abuse, diverticulosis, CVA    Limitations Fall Risk. Not Driving. Aphasia. Sensory deficits RUE. Apraxia.    Patient Stated Goals get arm working better (right)    Currently in Pain? No/denies                        OT Treatments/Exercises (OP) - 10/28/20 1632      Neurological Re-education Exercises   Other Exercises 1 UE ranger in side lying and in sitting with shoulder mobility in RUE requiring total A d/t hemiparesis. Pt actively trying to activate muscles and engage neuroplasticity but limited by attention. Weight bearing into RUE while seated edge of mat for engaging muscles and neuromuscular reeducation. Pt also with limited wrist extension so weight bearing provided for stretch for  increase ROM      Manual Therapy   Manual Therapy Joint mobilization;Passive ROM;Soft tissue mobilization    Manual therapy comments RUE scapular region - pt with poor scap mobility and benefitted from [facility] and joint mobilizations for inhibiting tone and increasing overal range of motion                  OT Education - 10/28/20 1627    Education Details encouraged patient to continue self PROM exercises 3 x daily    Person(s) Educated Patient;Caregiver(s)    Methods Explanation;Demonstration;Handout    Comprehension Verbalized understanding;Returned demonstration            OT Short Term Goals - 10/28/20 1626      OT SHORT TERM GOAL #1   Title Pt and caregiver will be independent with HEP12/30/21    Baseline not issued yet    Time 4    Period Weeks    Status On-going    Target Date 10/01/20      OT SHORT TERM GOAL #2   Title Pt and caregiver will verbalize understanding of sensory strategies for safety regarding RUE with sensory deficits.    Baseline not reviewed/issued    Time 4    Period Weeks    Status On-going      OT SHORT TERM GOAL #  3   Title Pt will complete table top scanning task with supervision and 75% accuracy.    Baseline right inatention and/or field deficits noted at eval    Time 4    Period Weeks    Status On-going      OT SHORT TERM GOAL #4   Title Pt will complete light snack or beverage prep with set up assistance of materials that are not in reach with supervision and adapted strategies or equipment PRN    Baseline total A    Time 4    Period Weeks    Status On-going      OT SHORT TERM GOAL #5   Title Pt will don and doff shoes with supervision for increasing independence with basic ADLs    Baseline max A    Time 4    Period Weeks    Status On-going      OT SHORT TERM GOAL #6   Title Pt and caregiver will verbalize and demonstrate understanding of splints and/or orthoses PRN for right hemiplegia    Baseline none issued    Time 4     Period Weeks    Status Achieved             OT Long Term Goals - 09/03/20 1757      OT LONG TERM GOAL #1   Title Pt and caregiver will be independent with updated HEP 10/29/20    Baseline not issued    Time 8    Period Weeks    Status New    Target Date 10/29/20      OT LONG TERM GOAL #2   Title Pt will perform UB dressing with min A for increase in independence with ADLs    Baseline max A    Time 8    Period Weeks    Status New      OT LONG TERM GOAL #3   Title Pt will perform table top scanning task with 100% accuracy    Baseline inattention to right    Time 8    Period Weeks    Status New      OT LONG TERM GOAL #4   Title Pt will achieve PROM of RUE shoulder flexion of 75 degrees with pain no greater than 4/10    Baseline 60 degrees pain 5/10    Time 8    Period Weeks    Status New      OT LONG TERM GOAL #5   Title Pt will report assisting with at least one home management or cooking activity per day in order to increase independence and decrease caregiver burden    Baseline not currently assisting    Time 8    Period Weeks    Status New                 Plan - 10/28/20 1619    Clinical Impression Statement significant increase in spasticity and tone in RUE impeding overall mobility. Pt continues to be limited with little to no active movement in RUE. Pt actively participates in therapy sessions.    OT Occupational Profile and History Problem Focused Assessment - Including review of records relating to presenting problem    Occupational performance deficits (Please refer to evaluation for details): ADL's;IADL's;Leisure    Body Structure / Function / Physical Skills ADL;Decreased knowledge of use of DME;Strength;Gait;GMC;Tone;Pain;Dexterity;Balance;UE functional use;Proprioception;ROM;IADL;Vision;Sensation;Coordination;FMC    Cognitive Skills Attention;Energy/Drive;Perception;Understand;Sequencing;Problem Solve    Rehab Potential Good  Clinical  Decision Making Limited treatment options, no task modification necessary    Comorbidities Affecting Occupational Performance: None    Modification or Assistance to Complete Evaluation  No modification of tasks or assist necessary to complete eval    OT Frequency 1x / week    OT Duration 8 weeks   +eval   OT Treatment/Interventions Self-care/ADL training;Moist Heat;DME and/or AE instruction;Balance training;Aquatic Therapy;Therapeutic activities;Cognitive remediation/compensation;Therapeutic exercise;Visual/perceptual remediation/compensation;Passive range of motion;Neuromuscular education;Functional Mobility Training;Cryotherapy;Electrical Stimulation;Energy conservation;Manual Therapy;Patient/family education    Plan NMR RUE    OT Home Exercise Plan Self-PROM seated shoulder    Recommended Other Services Pt receiving ST and PT    Consulted and Agree with Plan of Care Patient;Family member/caregiver    Family Member Consulted daughter           Patient will benefit from skilled therapeutic intervention in order to improve the following deficits and impairments:   Body Structure / Function / Physical Skills: ADL,Decreased knowledge of use of DME,Strength,Gait,GMC,Tone,Pain,Dexterity,Balance,UE functional use,Proprioception,ROM,IADL,Vision,Sensation,Coordination,FMC Cognitive Skills: Attention,Energy/Drive,Perception,Understand,Sequencing,Problem Solve     Visit Diagnosis: Unsteadiness on feet  Difficulty in walking, not elsewhere classified  Other lack of coordination  Muscle weakness (generalized)  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 Anemia 05/31/2020  . Acute ischemic left MCA stroke (HCC) 04/30/2020  . Right hemiplegia (HCC) 04/30/2020  . CVA  (cerebral vascular accident) (HCC) 04/21/2020  . Unspecified atrial fibrillation (HCC) 04/21/2020  . Essential hypertension 04/21/2020  . Alcohol abuse 04/21/2020    Junious Dresser MOT, OTR/L  10/28/2020, 4:38 PM  [place] [facility] [address] [place], [place], [address] Phone: [phone]   Fax:  [phone]  Name: Yumalay Circle MRN: [id number] Date of Birth: 1955/12/19

## 2020-10-28 NOTE — Therapy (Signed)
[facility] [address] [place], [place], [address] Phone: [phone]   Fax:  [phone]  Physical Therapy Treatment  Patient Details  Name: Lindsey [name] MRN: [id number] Date of Birth: 12/30/1955 Referring Provider (PT): Izora Ribas, MD   Encounter Date: 10/28/2020   PT End of Session - 10/28/20 1452    Visit Number 12    Number of Visits 17    Date for PT Re-Evaluation --   POC for 10 weeks total   Authorization Type UHC Medicaid (VL: 27 between OT/PT/ST)    PT Start Time 1448    PT Stop Time 1530    PT Time Calculation (min) 42 min    Equipment Utilized During Treatment Gait belt    Activity Tolerance Patient tolerated treatment well    Behavior During Therapy WFL for tasks assessed/performed           Past Medical History:  Diagnosis Date  . Diverticulosis   . Hypertension   . Uterine prolapse     History reviewed. No pertinent surgical history.  There were no vitals filed for this visit.   Subjective Assessment - 10/28/20 1451    Subjective Patient reports no new changes. No falls. Picked up the shoe today and had toe cap placed on it.    Patient is accompained by: --   Caregiver   Pertinent History HTN, Alcohol Abuse    Limitations Standing;Walking    Patient Stated Goals Be able to walk    Currently in Pain? No/denies               [facility] Adult PT Treatment/Exercise - 10/28/20 0001      Transfers   Transfers Sit to Stand;Stand to Sit    Sit to Stand 4: Min guard;4: Min assist    Sit to Stand Details Tactile cues for weight shifting;Tactile cues for placement;Verbal cues for sequencing;Verbal cues for safe use of DME/AE;Verbal cues for technique;Manual facilitation for weight shifting;Manual facilitation for placement    Sit to Stand Details (indicate cue type and reason) continue to require cues to scoot forward and align prior to standing    Stand to Sit 4: Min assist    Stand  to Sit Details cues to reach back for seat prior to descent      Ambulation/Gait   Ambulation/Gait Yes    Ambulation/Gait Assistance 4: Min guard;4: Min assist    Ambulation/Gait Assistance Details patient had toe cap added to personal shoe. completed ambulation with R AFO and R hand orthotic, ambulation 2 x 115 ft. rest break required between completion. PT providing verbal cues for improved step length on LLE. once instance of catching RLE and Min A to maintain balance. PT providing manual faicliation for improved weight shift noted.    Ambulation Distance (Feet) 115 Feet   x 2   Assistive device Rolling walker    Gait Pattern Step-through pattern;Decreased stance time - right;Decreased step length - left;Decreased dorsiflexion - right;Decreased weight shift to right    Ambulation Surface Level;Indoor      Therapeutic Activites    Therapeutic Activities Other Therapeutic Activities    Other Therapeutic Activities continued to educate on elevating RLE to manage swelling.      Neuro Re-ed    Neuro Re-ed Details  Completed NMR in // bars: including standing tolerance, patient able to stand x 1 minute without UE support and miantain balance. Then progressed and standing x 1 min 30 seconds. Completed standing  and ocmpleting steps forward on LLE x 10 reps to promote improved weight shift to RLE. Completed stnading with toe taps to 4" step x 10 reps on LLE to promote R weight shift. Then completed 2 x 10 reps on RLE to promote improved hip/knee flexion, Min A required for completion, especially with increased fatigue.                    PT Short Term Goals - 10/15/20 5027      PT SHORT TERM GOAL #1   Title Patient and caregiver will report completing ambulation 1-2x/daily within the home (All STGs Due at Visit 9)    Baseline pt reports ambulating at home but just 2-3x a week    Time 2    Period Weeks    Status Not Met      PT SHORT TERM GOAL #2   Title Patient will be able to  ambulate with RW and Min A x 175 ft to demonstrate improved household mobility    Baseline 140 ft Min A RW, 95' with RW with min A    Time 2    Period Weeks    Status Not Met             PT Long Term Goals - 09/18/20 1135      PT LONG TERM GOAL #1   Title Patient will be independent with final HEP and walking program within the home with caregiver assistance(All LTGS Due at Visit 17)    Baseline conitnue to progress    Time 10    Period Weeks    Status Revised      PT LONG TERM GOAL #2   Title Patient will demo ability to complete sit <> stand with CGA and LRAD to demonstrate improved functional mobility    Baseline Min A and RW    Time 10    Period Weeks    Status On-going      PT LONG TERM GOAL #3   Title Patient wll demo ability to complete all bed mobility with CGA to demonstrate improved funcitonal mobility and reduced caregiver assistance    Baseline Mod A; Min a on 12/17    Time 10    Period Weeks    Status On-going      PT LONG TERM GOAL #4   Title Patient will demo ability to ambulate > 200 ft with LRAD and R AFO donned with CGA for improved household mobility    Baseline 140 ft with RW and AFO, Min A    Time 10    Period Weeks    Status Revised                 Plan - 10/28/20 1537    Clinical Impression Statement Completed gait training today, patient able to complete 115' x 2 today with intermittent rest breaks. Patient had toe cap placed on RLE increased ability to step through with RLE noted with reduced assistance. Patient still require assistance with fatigue of RLE including with NMR. Continued activites to promote weight shift onto RLE. Continue to progress toward all LTGs.    Personal Factors and Comorbidities Comorbidity 2    Comorbidities HTN, Alcohol Abuse, Diverticulosis    Examination-Activity Limitations Bed Mobility;Bathing;Sit;Transfers;Dressing;Stairs;Stand;Locomotion Level;Toileting    Examination-Participation Restrictions  Occupation;Community Activity;Driving    Stability/Clinical Decision Making Evolving/Moderate complexity    Rehab Potential Good    PT Frequency 1x / week    PT Duration 2  weeks   followed by 1x/week for 8 weeks beginning January (MCD Visits Restart)   PT Treatment/Interventions ADLs/Self Care Home Management;Electrical Stimulation;DME Instruction;Gait training;Stair training;Therapeutic activities;Functional mobility training;Therapeutic exercise;Balance training;Neuromuscular re-education;Patient/family education;Orthotic Fit/Training;Wheelchair mobility training;Manual techniques;Passive range of motion    PT Next Visit Plan Continue gait training working on improved weight shift, sit <> stand training, and activites to promote improved weight shift to RLE. Standing balance. Activites to promtoe improved hip/knee flexion on RLE    PT Home Exercise Plan Access Code: [email or web address]    Consulted and Agree with Plan of Care Patient;Family member/caregiver    Family Member Consulted Daughter           Patient will benefit from skilled therapeutic intervention in order to improve the following deficits and impairments:  Abnormal gait,Decreased balance,Decreased mobility,Decreased endurance,Difficulty walking,Impaired sensation,Pain,Impaired UE functional use,Decreased strength,Decreased safety awareness,Decreased knowledge of use of DME,Decreased activity tolerance,Decreased range of motion,Impaired tone,Improper body mechanics  Visit Diagnosis: Unsteadiness on feet  Muscle weakness (generalized)  Difficulty in walking, not elsewhere classified  Hemiplegia and hemiparesis following other cerebrovascular disease affecting right dominant side [facility])     Problem List Patient Active Problem List   Diagnosis Date Noted  . Anemia 05/31/2020  . Acute ischemic left MCA stroke ([place]) 04/30/2020  . Right hemiplegia ([place]) 04/30/2020  . CVA (cerebral vascular accident) ([place]) 04/21/2020  . Unspecified  atrial fibrillation ([place]) 04/21/2020  . Essential hypertension 04/21/2020  . Alcohol abuse 04/21/2020    Jones Bales, PT, DPT 10/28/2020, 3:40 PM  [facility] [address] [place], [place], [address] Phone: [phone]   Fax:  [phone]  Name: Lindsey [name] MRN: [id number] Date of Birth: [birth year]

## 2020-10-29 DIAGNOSIS — G464 Cerebellar stroke syndrome: Secondary | ICD-10-CM | POA: Diagnosis not present

## 2020-10-30 DIAGNOSIS — G464 Cerebellar stroke syndrome: Secondary | ICD-10-CM | POA: Diagnosis not present

## 2020-10-31 DIAGNOSIS — G464 Cerebellar stroke syndrome: Secondary | ICD-10-CM | POA: Diagnosis not present

## 2020-11-01 DIAGNOSIS — G464 Cerebellar stroke syndrome: Secondary | ICD-10-CM | POA: Diagnosis not present

## 2020-11-02 ENCOUNTER — Encounter: Payer: Self-pay | Admitting: Adult Health

## 2020-11-02 ENCOUNTER — Ambulatory Visit: Payer: Medicaid Other | Admitting: Adult Health

## 2020-11-02 ENCOUNTER — Other Ambulatory Visit: Payer: Self-pay

## 2020-11-02 VITALS — BP 113/78 | HR 85 | Ht 64.0 in

## 2020-11-02 DIAGNOSIS — E785 Hyperlipidemia, unspecified: Secondary | ICD-10-CM | POA: Diagnosis not present

## 2020-11-02 DIAGNOSIS — G8111 Spastic hemiplegia affecting right dominant side: Secondary | ICD-10-CM | POA: Diagnosis not present

## 2020-11-02 DIAGNOSIS — I10 Essential (primary) hypertension: Secondary | ICD-10-CM

## 2020-11-02 DIAGNOSIS — Z8673 Personal history of transient ischemic attack (TIA), and cerebral infarction without residual deficits: Secondary | ICD-10-CM | POA: Diagnosis not present

## 2020-11-02 DIAGNOSIS — G464 Cerebellar stroke syndrome: Secondary | ICD-10-CM | POA: Diagnosis not present

## 2020-11-02 DIAGNOSIS — I4819 Other persistent atrial fibrillation: Secondary | ICD-10-CM

## 2020-11-02 NOTE — Progress Notes (Signed)
[facility] [address] [place]. [place] [address] [phone]       STROKE FOLLOW UP NOTE  [name] Date of Birth:  [birth year] Medical Record Number:  [id number]   Reason for Referral:  stroke follow up    SUBJECTIVE:   CHIEF COMPLAINT:  Chief Complaint  Patient presents with  . Follow-up    Treatment room with daughter Lindsey [name])   . Cerebrovascular Accident    HPI:   Today, 11/02/2020, Lindsey [name] returns for stroke follow-up accompanied by her daughter who provides majority of history.  Stable since prior visit without new or worsening stroke/TIA symptoms and reports residual right spastic hemiplegia and aphasia.  Continues to work with neuro rehab PT/OT/SLP with progress notes personally reviewed and has been slowly making improvements.  She ambulates now short distances with rolling walker and AFO brace.  OT reached out in regards to possible benefit of muscle relaxant for RUE spasticity and tone interfering with overall mobility. Per daughter, she questions if there is an anxiety component in regards to RUE spasticity as this worsens prior to therapy sessions. She is also routinely followed by PMR with follow-up visit scheduled 2/11.  She has remained on Eliquis and atorvastatin for secondary stroke prevention of side effects.  She has not had recent lab work.  Blood pressure today 113/78.  No further concerns at this time.    History provided for reference purposes only Initial visit 07/08/2020 JM: Lindsey [name] is being seen for hospital follow-up accompanied by her daughter who provides majority of history.  She was discharged home from CIR on 05/29/2020.  Residual deficits of right hemiplegia and expressive > receptive aphasia but has experienced ongoing improvement.  No residual dysphagia.  Able to stand with assistance but unable to ambulate at this time.  She did receive HH therapy for 2 weeks but placed on hold as she was awaiting for Medicaid  approval.  Recently approved by Medicaid and daughter requesting possibility of restarting therapy sessions.  Denies new or worsening stroke/TIA symptoms. CT head repeated 8/9 during CIR admission and cleared to initiate Eliquis on 8/11.  She remains on Eliquis without bleeding or bruising.  Remains on atorvastatin 40 mg daily without myalgias.  Blood pressure today 136/88. Monitored at home by therapy which was stable.  No further concerns at this time.  Stroke admission 04/21/2020 Lindsey [name] is a 65 y.o. female with history of HTN who presented on 04/21/2020 with slurred speech and right-sided weakness.  Stroke work-up revealed left MCA infarct with hemorrhagic transformation, embolic most likely secondary to new onset A. fib.  CTA showed left M2 occlusion but unfortunately not surgical candidate due to late presentation.  No antithrombotic PTA and initiated aspirin with recommendation of initiating anticoagulation after 3 to 5 days due to hemorrhagic transformation for new onset A. Fib. HTN stable and resumed home meds amlodipine and atenolol.  LDL 127 initiate atorvastatin 40 mg daily.  Pre-DM with A1c 6.1.  Other stroke risk factors include former tobacco use, EtOH use, and substance use with positive UDS for THC.  Residual deficits of dysphagia, global aphasia and right hemiplegia.  Evaluated therapy and recommended discharge to CIR for ongoing therapy needs.  Discharged to CIR on 04/30/2020.  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 Mild L lateral ventricle mass effect w/ 98mm midline shift. ASPECTS 4  CTA head & neck L MCA infarct. L M2 occlusion.   MRI  L MCA infarct w/ mild hemorrhagic transformation   Repeat CT head evolving L MCA infarct w/ stable hemorrhage   2D Echo normal ejection fraction.  No clot.  LE doppler no DVT.    LDL 127  HgbA1c 6.1  VTE  prophylaxis - added Lovenox 40 mg sq daily (ok in setting of hemorrhagic transformation)  No antithrombotic prior to admission, now on No antithrombotic given hemorrhagic transformation. Ok for aspirin (not ok for [facility]). Will add.     Therapy recommendations:  CIR  Disposition:  CIR      ROS:   Unable to obtain due to aphasia  PMH:  Past Medical History:  Diagnosis Date  . Diverticulosis   . Hypertension   . Uterine prolapse     PSH: History reviewed. No pertinent surgical history.  Social History:  Social History   Socioeconomic History  . Marital status: Single    Spouse name: Not on file  . Number of children: Not on file  . Years of education: Not on file  . Highest education level: Not on file  Occupational History  . Not on file  Tobacco Use  . Smoking status: Never Smoker  . Smokeless tobacco: Never Used  Vaping Use  . Vaping Use: Never used  Substance and Sexual Activity  . Alcohol use: Yes    Alcohol/week: 0.0 standard drinks    Comment: states once a wine/beer occassionally   . Drug use: No  . Sexual activity: Never  Other Topics Concern  . Not on file  Social History Narrative  . Not on file   Social Determinants of Health   Financial Resource Strain: Not on file  Food Insecurity: Not on file  Transportation Needs: Not on file  Physical Activity: Not on file  Stress: Not on file  Social Connections: Not on file  Intimate Partner Violence: Not on file    Family History:  Family History  Problem Relation Age of Onset  . Breast cancer Neg Hx     Medications:   Current Outpatient Medications on File Prior to Visit  Medication Sig Dispense Refill  . acetaminophen (TYLENOL) 325 MG tablet Take 1-2 tablets (325-650 mg total) by mouth every 4 (four) hours as needed for mild pain.    Marland Kitchen amLODipine (NORVASC) 5 MG tablet Take 1 tablet (5 mg total) by mouth daily. 30 tablet 3  . apixaban (ELIQUIS) 5 MG TABS tablet Take 1 tablet (5 mg total) by mouth  2 (two) times daily. 60 tablet 0  . atorvastatin (LIPITOR) 40 MG tablet Take 1 tablet (40 mg total) by mouth daily at 6 PM. 30 tablet 0  . diclofenac Sodium (VOLTAREN) 1 % GEL Apply 2 g topically 3 (three) times daily. 350 g 0  . gabapentin (NEURONTIN) 100 MG capsule Take 1 capsule (100 mg total) by mouth 2 (two) times daily. 60 capsule 0  . metoprolol tartrate (LOPRESSOR) 50 MG tablet Take 1 tablet (50 mg total) by mouth 2 (two) times daily. 60 tablet 0  . mirtazapine (REMERON SOL-TAB) 15 MG disintegrating tablet Take 1 tablet (15 mg total) by mouth at bedtime. 30 tablet 0  . Mouthwashes (MOUTH RINSE) LIQD solution 15 mLs by Mouth Rinse route 2 times daily at 12 noon and 4 pm. 473 mL 0   No current facility-administered medications on file prior to visit.    Allergies:  No Known Allergies  OBJECTIVE:  Physical Exam  Vitals:   11/02/20 0844  BP: 113/78  Pulse: 85  Height: 5\' 4"  (1.626 m)   Body mass index is 28.32 kg/m. No exam data present  General: well developed, well nourished,  pleasant middle-age African-American female, seated, in no evident distress Head: head normocephalic and atraumatic.   Neck: supple with no carotid or supraclavicular bruits Cardiovascular: irregular rate and rhythm, no murmurs Musculoskeletal: no deformity Skin:  no rash/petichiae Vascular:  Normal pulses all extremities   Neurologic Exam Mental Status: Awake and fully alert. Moderate to severe expressive> mild receptive aphasia.  Follows simple step commands without difficulty.  Able to name some objects but does better with options.  Difficulty fully assessing orientation due to aphasia. Majority of history provided by daughter.  Mood and affect appropriate.  Cranial Nerves: Pupils equal, briskly reactive to light. Extraocular movements full without nystagmus. Visual fields full to confrontation. Hearing intact. Facial sensation intact.  Right lower facial weakness.  Tongue, palate moves  normally and symmetrically.  Motor: Full strength left upper and lower extremity.  RUE: 0/5 (although slight movement of fingers when asked to make a fist), full wrist PROM except slight difficulty with extension, limited shoulder PROM d/t pain; RLE: 2/5 hip flexor, 4/5 knee extension and flexion and 0/5 ankle dorsilfexion and 1/5 planter flexion  Sensory.: intact to touch , pinprick , position and vibratory sensation.  Coordination: Rapid alternating movements normal in all extremities on left side. Finger-to-nose and heel-to-shin performed accurately on left side. Gait and Station: Deferred as RW not present Reflexes: 2+ RUE and RLE: 1+ LUE and LLE. Toes downgoing.       ASSESSMENT: Lindsey [name] is a 65 y.o. year old female presented with right-sided weakness and slurred speech on 04/21/2020 found to have dysarthria, aphasia, left gaze preference and right hemiparesis and noted to be in A. fib with RVR with stroke work-up revealing left MCA infarct with hemorrhagic transformation secondary to new onset A. fib. Vascular risk factors include new onset A. fib, HTN, HLD, pre-DM, history of tobacco use and THC use.      PLAN:  1. Left MCA stroke:  a. Residual deficit: Right spastic hemiparesis, aphasia and right facial weakness -improvement noted since prior visit.  Continue working with neuro rehab PT/OT/SLP for hopeful further recovery.  Discussed possible use of muscle relaxant vs botox for RUE spasticity.  Appears more localized in wrist area therefore may benefit more with Botox vs systemic medication - per daughter request, she plans on further discussing with Dr. 04/23/2020 at f/u visit.  Also discussed anxiety component in regards to worsening spasticity during therapy sessions -hopefully this will continue to improve as she becomes stronger and more confident with ambulation b. Continue Eliquis (apixaban) daily  and atorvastatin for secondary stroke prevention.  c. Discussed secondary stroke  prevention measures and importance of close PCP follow up for aggressive stroke risk factor management  2. Atrial fibrillation, new dx:  a. CHA2DS2-VASc score 5 indicating AC for stroke prevention b. On Eliquis per cardiology 3. HTN: BP goal <130/90.  Well-controlled on amlodipine and metoprolol per PCP 4. HLD: LDL goal <70.  Prior LDL 127.  On atorvastatin 40 mg daily -repeat lipid panel today.  Request ongoing lipid panel monitoring and prescribing of atorvastatin by PCP    Follow up in 4 months or call earlier if needed   CC:  [facility] provider: Dr. Carlis Abbott, Ngwe A, MD    I spent 35 minutes of  face-to-face and non-face-to-face time with patient and daughter.  This included previsit chart review, lab review, study review, order entry, electronic health record documentation, patient education regarding prior stroke, residual deficits, importance of managing stroke risk factors and answered all other questions to patient satisfaction   Ihor Austin, [facility]  [facility] [address] [place], [place] [address]  Phone [phone] Fax [phone] Note: This document was prepared with digital dictation and possible smart phrase technology. Any transcriptional errors that result from this process are unintentional.

## 2020-11-02 NOTE — Patient Instructions (Signed)
Continue working with physical, occupation and speech therapies -  You have been making progress since prior visit -- keep up the good work!!  Further discuss possible use of botox with physical medicine and rehab at follow up visit  Continue Eliquis (apixaban) daily  and atorvastatin 40mg  daily  for secondary stroke prevention  Continue to follow up with PCP regarding cholesterol and blood pressure management  Maintain strict control of hypertension with blood pressure goal below 130/90 and cholesterol with LDL cholesterol (bad cholesterol) goal below 70 mg/dL.      Followup in the future with me in 4 months or call earlier if needed       Thank you for coming to see at [facility]. I hope we have been able to provide you high quality care today.  You may receive a patient satisfaction survey over the next few weeks. We would appreciate your feedback and comments so that we may continue to improve ourselves and the health of our patients.

## 2020-11-02 NOTE — Progress Notes (Signed)
I agree with the above plan 

## 2020-11-03 ENCOUNTER — Other Ambulatory Visit: Payer: Self-pay | Admitting: Adult Health

## 2020-11-03 DIAGNOSIS — G464 Cerebellar stroke syndrome: Secondary | ICD-10-CM | POA: Diagnosis not present

## 2020-11-03 LAB — LIPID PANEL
Chol/HDL Ratio: 2.1 ratio (ref 0.0–4.4)
Cholesterol, Total: 87 mg/dL — ABNORMAL LOW (ref 100–199)
HDL: 41 mg/dL (ref >39)
LDL Chol Calc (NIH): 32 mg/dL (ref 0–99)
Triglycerides: 59 mg/dL (ref 0–149)
VLDL Cholesterol Cal: 14 mg/dL (ref 5–40)

## 2020-11-03 MED ORDER — ATORVASTATIN CALCIUM 20 MG PO TABS
20.0000 mg | ORAL_TABLET | Freq: Every day | ORAL | 3 refills | Status: DC
Start: 2020-11-03 — End: 2021-06-29

## 2020-11-04 ENCOUNTER — Ambulatory Visit: Payer: Medicaid Other | Admitting: Physical Therapy

## 2020-11-04 ENCOUNTER — Encounter: Payer: Self-pay | Admitting: Occupational Therapy

## 2020-11-04 ENCOUNTER — Ambulatory Visit: Payer: Medicaid Other | Admitting: Speech Pathology

## 2020-11-04 ENCOUNTER — Other Ambulatory Visit: Payer: Self-pay

## 2020-11-04 ENCOUNTER — Encounter: Payer: Self-pay | Admitting: Physical Therapy

## 2020-11-04 ENCOUNTER — Ambulatory Visit: Payer: Medicaid Other | Attending: Physical Medicine and Rehabilitation | Admitting: Occupational Therapy

## 2020-11-04 VITALS — BP 117/91 | HR 103

## 2020-11-04 DIAGNOSIS — R41840 Attention and concentration deficit: Secondary | ICD-10-CM

## 2020-11-04 DIAGNOSIS — R2681 Unsteadiness on feet: Secondary | ICD-10-CM

## 2020-11-04 DIAGNOSIS — M6281 Muscle weakness (generalized): Secondary | ICD-10-CM | POA: Diagnosis present

## 2020-11-04 DIAGNOSIS — R4701 Aphasia: Secondary | ICD-10-CM

## 2020-11-04 DIAGNOSIS — I69851 Hemiplegia and hemiparesis following other cerebrovascular disease affecting right dominant side: Secondary | ICD-10-CM

## 2020-11-04 DIAGNOSIS — M25631 Stiffness of right wrist, not elsewhere classified: Secondary | ICD-10-CM | POA: Diagnosis present

## 2020-11-04 DIAGNOSIS — R262 Difficulty in walking, not elsewhere classified: Secondary | ICD-10-CM

## 2020-11-04 DIAGNOSIS — R41842 Visuospatial deficit: Secondary | ICD-10-CM | POA: Diagnosis present

## 2020-11-04 DIAGNOSIS — R41844 Frontal lobe and executive function deficit: Secondary | ICD-10-CM | POA: Diagnosis present

## 2020-11-04 DIAGNOSIS — R278 Other lack of coordination: Secondary | ICD-10-CM | POA: Diagnosis present

## 2020-11-04 DIAGNOSIS — G464 Cerebellar stroke syndrome: Secondary | ICD-10-CM | POA: Diagnosis not present

## 2020-11-04 NOTE — Therapy (Signed)
[facility] [address] [place], [place], [address] Phone: [phone]   Fax:  [phone]  Occupational Therapy Treatment  Patient Details  Name: Lindsey [name] MRN: [id number] Date of Birth: Oct 28, 1955 Referring Provider (OT): Sula Soda, MD   Encounter Date: 11/04/2020   OT End of Session - 11/04/20 1449    Visit Number 8    Number of Visits 9    Date for OT Re-Evaluation 11/12/20    Authorization Type [employer] - Medicaid    Authorization Time Period VL: 27 PT/OT/ST combined Week 7 of 8 (2/2)    OT Start Time 1448    OT Stop Time 1527    OT Time Calculation (min) 39 min    Activity Tolerance Patient tolerated treatment well    Behavior During Therapy [facility] for tasks assessed/performed;Restless           Past Medical History:  Diagnosis Date  . Diverticulosis   . Hypertension   . Uterine prolapse     History reviewed. No pertinent surgical history.  There were no vitals filed for this visit.   Subjective Assessment - 11/04/20 1449    Subjective  Pt denies any pain and said ST was "good".    Patient is accompanied by: Family member   daughter   Pertinent History PMH significant for HTN, EtOH abuse, diverticulosis, CVA    Limitations Fall Risk. Not Driving. Aphasia. Sensory deficits RUE. Apraxia.    Patient Stated Goals get arm working better (right)    Currently in Pain? No/denies                        OT Treatments/Exercises (OP) - 11/04/20 1801      ADLs   LB Dressing doffing and donning shoes today - doffed bilateral shoes with supervision. Pt required mod A for donning shoes with assistance for tying shoes and donning R shoe.      Neurological Re-education Exercises   Other Exercises 1 NMR in sidelying on right side ofr weight bearing into RUE shoulder and worked on active assisted range of motion nd movement with elbow flexion/extension, composite flexion/extenstion and shoulder  flexion/extension. Pt with trace activation in all joints but limited carry through with working through movements.      Manual Therapy   Manual Therapy Joint mobilization    Joint Mobilization RUE wrist mobilization and passive range of motion for inhibition of tone.                    OT Short Term Goals - 11/04/20 1450      OT SHORT TERM GOAL #1   Title Pt and caregiver will be independent with HEP12/30/21    Baseline not issued yet    Time 4    Period Weeks    Status Achieved    Target Date 10/01/20      OT SHORT TERM GOAL #2   Title Pt and caregiver will verbalize understanding of sensory strategies for safety regarding RUE with sensory deficits.    Baseline not reviewed/issued    Time 4    Period Weeks    Status Achieved      OT SHORT TERM GOAL #3   Title Pt will complete table top scanning task with supervision and 75% accuracy.    Baseline right inatention and/or field deficits noted at eval    Time 4    Period Weeks    Status On-going  OT SHORT TERM GOAL #4   Title Pt will complete light snack or beverage prep with set up assistance of materials that are not in reach with supervision and adapted strategies or equipment PRN    Baseline total A    Time 4    Period Weeks    Status On-going   min A     OT SHORT TERM GOAL #5   Title Pt will don and doff shoes with supervision for increasing independence with basic ADLs    Baseline max A    Time 4    Period Weeks    Status On-going      OT SHORT TERM GOAL #6   Title Pt and caregiver will verbalize and demonstrate understanding of splints and/or orthoses PRN for right hemiplegia    Baseline none issued    Time 4    Period Weeks    Status Achieved             OT Long Term Goals - 09/03/20 1757      OT LONG TERM GOAL #1   Title Pt and caregiver will be independent with updated HEP 10/29/20    Baseline not issued    Time 8    Period Weeks    Status New    Target Date 10/29/20      OT  LONG TERM GOAL #2   Title Pt will perform UB dressing with min A for increase in independence with ADLs    Baseline max A    Time 8    Period Weeks    Status New      OT LONG TERM GOAL #3   Title Pt will perform table top scanning task with 100% accuracy    Baseline inattention to right    Time 8    Period Weeks    Status New      OT LONG TERM GOAL #4   Title Pt will achieve PROM of RUE shoulder flexion of 75 degrees with pain no greater than 4/10    Baseline 60 degrees pain 5/10    Time 8    Period Weeks    Status New      OT LONG TERM GOAL #5   Title Pt will report assisting with at least one home management or cooking activity per day in order to increase independence and decrease caregiver burden    Baseline not currently assisting    Time 8    Period Weeks    Status New                 Plan - 11/04/20 1759    Clinical Impression Statement pt slowly progressing towards goals.    OT Occupational Profile and History Problem Focused Assessment - Including review of records relating to presenting problem    Occupational performance deficits (Please refer to evaluation for details): ADL's;IADL's;Leisure    Body Structure / Function / Physical Skills ADL;Decreased knowledge of use of DME;Strength;Gait;GMC;Tone;Pain;Dexterity;Balance;UE functional use;Proprioception;ROM;IADL;Vision;Sensation;Coordination;FMC    Cognitive Skills Attention;Energy/Drive;Perception;Understand;Sequencing;Problem Solve    Rehab Potential Good    Clinical Decision Making Limited treatment options, no task modification necessary    Comorbidities Affecting Occupational Performance: None    Modification or Assistance to Complete Evaluation  No modification of tasks or assist necessary to complete eval    OT Frequency 1x / week    OT Duration 8 weeks   +eval   OT Treatment/Interventions Self-care/ADL training;Moist Heat;DME and/or AE instruction;Balance training;Aquatic Therapy;Therapeutic  activities;Cognitive  remediation/compensation;Therapeutic exercise;Visual/perceptual remediation/compensation;Passive range of motion;Neuromuscular education;Functional Mobility Training;Cryotherapy;Electrical Stimulation;Energy conservation;Manual Therapy;Patient/family education    Plan NMR RUE    OT Home Exercise Plan Self-PROM seated shoulder    Recommended Other Services Pt receiving ST and PT    Consulted and Agree with Plan of Care Patient;Family member/caregiver    Family Member Consulted daughter           Patient will benefit from skilled therapeutic intervention in order to improve the following deficits and impairments:   Body Structure / Function / Physical Skills: ADL,Decreased knowledge of use of DME,Strength,Gait,GMC,Tone,Pain,Dexterity,Balance,UE functional use,Proprioception,ROM,IADL,Vision,Sensation,Coordination,FMC Cognitive Skills: Attention,Energy/Drive,Perception,Understand,Sequencing,Problem Solve     Visit Diagnosis: Unsteadiness on feet  Muscle weakness (generalized)  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)  Other lack of coordination  Attention and concentration deficit  Stiffness of right wrist, not elsewhere classified  Visuospatial deficit  Frontal lobe and executive function deficit    Problem List Patient Active Problem List   Diagnosis Date Noted  . Anemia 05/31/2020  . Acute ischemic left MCA stroke (HCC) 04/30/2020  . Right hemiplegia (HCC) 04/30/2020  . CVA (cerebral vascular accident) (HCC) 04/21/2020  . Unspecified atrial fibrillation (HCC) 04/21/2020  . Essential hypertension 04/21/2020  . Alcohol abuse 04/21/2020    Junious Dresser MOT, OTR/L  11/04/2020, 6:04 PM  [facility] [address] [place], [place], [address] Phone: [phone]   Fax:  [phone]  Name: Lindsey [name] MRN:  [id number] Date of Birth: 04-12-1956

## 2020-11-04 NOTE — Therapy (Signed)
[facility] [address] [place], [place], [address] Phone: [phone]   Fax:  [phone]  Speech Language Pathology Treatment  Patient Details  Name: Lindsey [name] MRN: [id number] Date of Birth: 1955/11/27 Referring Provider (SLP): Dr. Sula Soda   Encounter Date: 11/04/2020   End of Session - 11/04/20 1545    Visit Number 6    Number of Visits 25    Date for SLP Re-Evaluation 11/13/20    Authorization Type UHC medicaid    SLP Start Time 1403    SLP Stop Time  1444    SLP Time Calculation (min) 41 min    Activity Tolerance Patient tolerated treatment well           Past Medical History:  Diagnosis Date  . Diverticulosis   . Hypertension   . Uterine prolapse     No past surgical history on file.  There were no vitals filed for this visit.          ADULT SLP TREATMENT - 11/04/20 1445      General Information   Behavior/Cognition Alert;Cooperative;Pleasant mood      Treatment Provided   Treatment provided Cognitive-Linquistic      Cognitive-Linquistic Treatment   Treatment focused on Aphasia;Patient/family/caregiver education    Skilled Treatment They brought in and added to communication sheet. Yesika required min A to ID family members using sheet. Written choices to ID music preferences and activity preferences. Gabryella verbalized choices with the written cues approximating words with semantaic and phonemic cues. Cues required to stop verbal perseveration. [name] reports success giving [employer] of foods and activitites and has is training her aide to use written choices with [place]. [name] is also providing cues for Daira to verbalize her preference with written cues      Assessment / Recommendations / Plan   Plan Continue with current plan of care            SLP Education - 11/04/20 1541    Education Details low tech AAC, multimodal communication    Person(s)  Educated Patient;Child(ren)    Methods Explanation;Demonstration    Comprehension Verbalized understanding;Returned demonstration;Verbal cues required            SLP Short Term Goals - 11/04/20 1543      SLP SHORT TERM GOAL #1   Title Family will use written choices of 2 or 3 to ID pt's wants/needs with occasional min A over 2 weeks outsdie of therapy    Baseline No written cues or choices being used    Time 1    Period Weeks    Status On-going      SLP SHORT TERM GOAL #2   Title Pt will use multimodal communication/AAC to communicate preferences for meal/snack 3x over 2 weeks with occasional min to mod A from family    Baseline no alternative communication strategies in place    Time 1    Period Weeks    Status On-going      SLP SHORT TERM GOAL #3   Title Pt will use multimodal/AAC to ID family members she wants to communicate with or about with occasional min to mod A from family.    Time 1    Period Weeks    Status On-going      SLP SHORT TERM GOAL #4   Title Pt will name 3 items in simple personally relevant category using multimodal communication with occasional min A    Baseline no  items in simple category    Time 1    Period Weeks    Status On-going      SLP SHORT TERM GOAL #5   Title Pt will ID 3 paraphasias with usual min to mod A over 4 sessions    Baseline no awareness of paraphasias or perseverations    Time 4    Period Weeks    Status On-going            SLP Long Term Goals - 11/04/20 1543      SLP LONG TERM GOAL #1   Title Pt will use multimodal communication to answer 5 preferences or answer 5 questions accurately at thte word level with min A from caregivers /ST over 3 sessions    Baseline other than yes/no, pt is not indicating preferences or answering basic questions accurately    Time 8    Period Weeks    Status On-going      SLP LONG TERM GOAL #2   Title Pt will ID 5 paraphasias in a session with usual min A and self correct with frequent  mod A in naming task or conversation over 2 sessions    Baseline pt unaware of paraphasias and perseverations    Time 8    Period Weeks    Status On-going      SLP LONG TERM GOAL #3   Title Caregivers will use written or photo cues at 2-3 word level to facilitate 3 turns in a conversation with pt and provide assistance in pt following a conversation topic with occasional min A over 2 sessions.    Baseline no multimodal supports in place for conversation success    Time 8    Period Weeks    Status On-going      SLP LONG TERM GOAL #4   Title Pt will name 5 items in personally relevant category using multmodal communciation with occasional min A    Baseline no items in simple category    Time 8    Period Weeks    Status On-going      SLP LONG TERM GOAL #5   Title Pt will attend aphasia conversation group (pending available technology) and family will ID 3 online/community support for persons with aphasia and caregivers    Baseline no community support identified    Time 9    Period Weeks    Status On-going            Plan - 11/04/20 1542    Clinical Impression Statement Moderate to severe aphasia persists. Pt successful in using written choices and low tech AAC at 2-4 word level. Ongoing training for use of AAC and multimodal communication. Continue skilled ST to maximize communication and reduce caregiver burden    Speech Therapy Frequency 2x / week    Duration 12 weeks    Treatment/Interventions Compensatory strategies;Patient/family education;Functional tasks;Cueing hierarchy;Environmental controls;Cognitive reorganization;Multimodal communcation approach;Language facilitation;Compensatory techniques;Internal/external aids;SLP instruction and feedback;Other (comment)    Potential to Achieve Goals Good    Potential Considerations Severity of impairments           Patient will benefit from skilled therapeutic intervention in order to improve the following deficits and  impairments:   Aphasia    Problem List Patient Active Problem List   Diagnosis Date Noted  . Anemia 05/31/2020  . Acute ischemic left MCA stroke (HCC) 04/30/2020  . Right hemiplegia (HCC) 04/30/2020  . CVA (cerebral vascular accident) (HCC) 04/21/2020  . Unspecified atrial fibrillation (  HCC) 04/21/2020  . Essential hypertension 04/21/2020  . Alcohol abuse 04/21/2020    Timber Marshman, Radene Journey MS, CCC-SLP 11/04/2020, 3:45 PM  [facility] [address] [place], [place], [address] Phone: [phone]   Fax:  [phone]   Name: Lindsey [name] MRN: [id number] Date of Birth: 06/04/1956

## 2020-11-05 DIAGNOSIS — G464 Cerebellar stroke syndrome: Secondary | ICD-10-CM | POA: Diagnosis not present

## 2020-11-05 NOTE — Therapy (Addendum)
[facility] [address] [place], [place], [address] Phone: [phone]   Fax:  [phone]  Physical Therapy Treatment  Patient Details  Name: Lindsey [name] MRN: [id number] Date of Birth: July 17, 1956 Referring Provider (PT): Izora Ribas, MD   Encounter Date: 11/04/2020   PT End of Session - 11/05/20 1154    Visit Number 13    Number of Visits 17    Date for PT Re-Evaluation --   POC for 10 weeks total   Authorization Type UHC Medicaid (VL: 27 between OT/PT/ST)    PT Start Time 1529    PT Stop Time 1614    PT Time Calculation (min) 45 min    Equipment Utilized During Treatment Gait belt    Activity Tolerance Patient tolerated treatment well   pt anxious/fearful   Behavior During Therapy WFL for tasks assessed/performed           Past Medical History:  Diagnosis Date  . Diverticulosis   . Hypertension   . Uterine prolapse     History reviewed. No pertinent surgical history.  Vitals:   11/04/20 1603  BP: (!) 117/91  Pulse: (!) 103     Subjective Assessment - 11/04/20 1529    Subjective No changes since she was last here. No falls. Has been walking at home and it has been going well.    Patient is accompained by: --   Caregiver   Pertinent History HTN, Alcohol Abuse    Limitations Standing;Walking    Patient Stated Goals Be able to walk    Currently in Pain? No/denies                            11/05/20 0001  Transfers  Transfers Sit to Stand;Stand to Sit  Sit to Stand 4: Min guard  Sit to Stand Details Tactile cues for weight shifting;Tactile cues for placement;Verbal cues for sequencing;Verbal cues for safe use of DME/AE;Verbal cues for technique;Manual facilitation for weight shifting;Manual facilitation for placement  Stand to Sit 4: Min guard  Stand to Sit Details (indicate cue type and reason) Verbal cues for precautions/safety;Verbal cues for technique;Verbal cues for  sequencing  Stand to Sit Details cues to reach back towards mat  Comments performed x5 reps sit <> stand with focus on proper UE placement when standing, pt with tendency to scoot LLE posteriorly prior to standing with therapist assisting with incr weight bearing through RLE, cues for slowed descent. performed an additional x6 reps with LLE on 2" block for incr weight shift/bearing towards RLE pt using single UE support to stand, once in standing use of mirror for visual feedback to shift weight towards R side and static standing without UE support x30 seconds, then slowly sitting back down to mat table with a couple reps not reaching back with min guard/min a  Ambulation/Gait  Pre-Gait Activities standing at RW: visual cues from mirror and manual cues from therapist to shift weight over to R side to help relax RUE to put in hand orthosis, pt with incr spasticity today in R hand/wrist. in standing practiced deep breathing with cues for pursed lip breathing as pt with more shallow respirations. pt's daughter reports pt is more anxious with walking during therapy than she is at home and her hand tends to relax at home. attempted 3 times, but unable to safely place RUE in hand orthosis today for gait  Neuro Re-ed   Neuro Re-ed  Details  standing in // bars: pt standing up from w/c, performing static standing balance with min guard 2 x 1  minute - use of mirror for visual feedback for weight shift to R, standing without UE support x10 reps head turns R/L, with use of LUE support - performing 2 x 10 reps of foot taps with LLE to 2" step for incr weight shift/bearing to RLE                PT Short Term Goals - 10/15/20 9562      PT SHORT TERM GOAL #1   Title Patient and caregiver will report completing ambulation 1-2x/daily within the home (All STGs Due at Visit 9)    Baseline pt reports ambulating at home but just 2-3x a week    Time 2    Period Weeks    Status Not Met      PT SHORT TERM GOAL  #2   Title Patient will be able to ambulate with RW and Min A x 175 ft to demonstrate improved household mobility    Baseline 140 ft Min A RW, 95' with RW with min A    Time 2    Period Weeks    Status Not Met             PT Long Term Goals - 09/18/20 1135      PT LONG TERM GOAL #1   Title Patient will be independent with final HEP and walking program within the home with caregiver assistance(All LTGS Due at Visit 17)    Baseline conitnue to progress    Time 10    Period Weeks    Status Revised      PT LONG TERM GOAL #2   Title Patient will demo ability to complete sit <> stand with CGA and LRAD to demonstrate improved functional mobility    Baseline Min A and RW    Time 10    Period Weeks    Status On-going      PT LONG TERM GOAL #3   Title Patient wll demo ability to complete all bed mobility with CGA to demonstrate improved funcitonal mobility and reduced caregiver assistance    Baseline Mod A; Min a on 12/17    Time 10    Period Weeks    Status On-going      PT LONG TERM GOAL #4   Title Patient will demo ability to ambulate > 200 ft with LRAD and R AFO donned with CGA for improved household mobility    Baseline 140 ft with RW and AFO, Min A    Time 10    Period Weeks    Status Revised                 Plan - 11/05/20 1554    Clinical Impression Statement Pt with incr anxiety today during session - unable to walk with RW and R hand orthosis today due to increased R wrist/hand spasticity. Unable to put hand into orthosis after deep breathing exercises and weight shifting towards R. Pt's daughter reports she is not anxious at home and can more easily put her hand in the orthosis. Remainder of session focused on RLE>LLE strengthening and standing balance with decr UE support. Will continue to progress towards LTGs.    Personal Factors and Comorbidities Comorbidity 2    Comorbidities HTN, Alcohol Abuse, Diverticulosis    Examination-Activity Limitations Bed  Mobility;Bathing;Sit;Transfers;Dressing;Stairs;Stand;Locomotion Level;Toileting    Examination-Participation Restrictions Occupation;Community Activity;Driving  Stability/Clinical Decision Making Evolving/Moderate complexity    Rehab Potential Good    PT Frequency 1x / week    PT Duration 2 weeks   followed by 1x/week for 8 weeks beginning January (MCD Visits Restart)   PT Treatment/Interventions ADLs/Self Care Home Management;Electrical Stimulation;DME Instruction;Gait training;Stair training;Therapeutic activities;Functional mobility training;Therapeutic exercise;Balance training;Neuromuscular re-education;Patient/family education;Orthotic Fit/Training;Wheelchair mobility training;Manual techniques;Passive range of motion    PT Next Visit Plan Continue gait training working on improved weight shift, sit <> stand training, and activites to promote improved weight shift to RLE. Standing balance. Activites to promtoe improved hip/knee flexion on RLE - will need to figure out about re-cert and visit limit, possibly go on hold to see if pt can get botox in R hand?    PT Home Exercise Plan Access Code: [email or web address]    Consulted and Agree with Plan of Care Patient;Family member/caregiver    Family Member Consulted Daughter           Patient will benefit from skilled therapeutic intervention in order to improve the following deficits and impairments:  Abnormal gait,Decreased balance,Decreased mobility,Decreased endurance,Difficulty walking,Impaired sensation,Pain,Impaired UE functional use,Decreased strength,Decreased safety awareness,Decreased knowledge of use of DME,Decreased activity tolerance,Decreased range of motion,Impaired tone,Improper body mechanics  Visit Diagnosis: Muscle weakness (generalized)  Unsteadiness on feet  Hemiplegia and hemiparesis following other cerebrovascular disease affecting right dominant side ([place])  Difficulty in walking, not elsewhere classified     Problem  List Patient Active Problem List   Diagnosis Date Noted  . Anemia 05/31/2020  . Acute ischemic left MCA stroke ([place]) 04/30/2020  . Right hemiplegia ([place]) 04/30/2020  . CVA (cerebral vascular accident) ([place]) 04/21/2020  . Unspecified atrial fibrillation ([place]) 04/21/2020  . Essential hypertension 04/21/2020  . Alcohol abuse 04/21/2020    Arliss Journey, PT, DPT  11/05/2020, 3:56 PM  [facility] [address] [place], [place], [address] Phone: [phone]   Fax:  [phone]  Name: Lindsey [name] MRN: [id number] Date of Birth: [birth year]

## 2020-11-06 DIAGNOSIS — G464 Cerebellar stroke syndrome: Secondary | ICD-10-CM | POA: Diagnosis not present

## 2020-11-07 DIAGNOSIS — G464 Cerebellar stroke syndrome: Secondary | ICD-10-CM | POA: Diagnosis not present

## 2020-11-08 DIAGNOSIS — G464 Cerebellar stroke syndrome: Secondary | ICD-10-CM | POA: Diagnosis not present

## 2020-11-09 DIAGNOSIS — G464 Cerebellar stroke syndrome: Secondary | ICD-10-CM | POA: Diagnosis not present

## 2020-11-10 DIAGNOSIS — G464 Cerebellar stroke syndrome: Secondary | ICD-10-CM | POA: Diagnosis not present

## 2020-11-11 ENCOUNTER — Ambulatory Visit: Payer: Medicaid Other | Admitting: Speech Pathology

## 2020-11-11 ENCOUNTER — Encounter: Payer: Self-pay | Admitting: Speech Pathology

## 2020-11-11 ENCOUNTER — Ambulatory Visit: Payer: Medicaid Other

## 2020-11-11 ENCOUNTER — Ambulatory Visit: Payer: Medicaid Other | Admitting: Occupational Therapy

## 2020-11-11 ENCOUNTER — Other Ambulatory Visit: Payer: Self-pay

## 2020-11-11 ENCOUNTER — Encounter: Payer: Self-pay | Admitting: Occupational Therapy

## 2020-11-11 DIAGNOSIS — M6281 Muscle weakness (generalized): Secondary | ICD-10-CM

## 2020-11-11 DIAGNOSIS — R2681 Unsteadiness on feet: Secondary | ICD-10-CM

## 2020-11-11 DIAGNOSIS — I69851 Hemiplegia and hemiparesis following other cerebrovascular disease affecting right dominant side: Secondary | ICD-10-CM

## 2020-11-11 DIAGNOSIS — G464 Cerebellar stroke syndrome: Secondary | ICD-10-CM | POA: Diagnosis not present

## 2020-11-11 DIAGNOSIS — R262 Difficulty in walking, not elsewhere classified: Secondary | ICD-10-CM

## 2020-11-11 DIAGNOSIS — R41840 Attention and concentration deficit: Secondary | ICD-10-CM

## 2020-11-11 DIAGNOSIS — R278 Other lack of coordination: Secondary | ICD-10-CM

## 2020-11-11 DIAGNOSIS — R4701 Aphasia: Secondary | ICD-10-CM

## 2020-11-11 DIAGNOSIS — M25631 Stiffness of right wrist, not elsewhere classified: Secondary | ICD-10-CM

## 2020-11-11 DIAGNOSIS — R41844 Frontal lobe and executive function deficit: Secondary | ICD-10-CM

## 2020-11-11 NOTE — Therapy (Signed)
[facility] [address] [place], [place], [address] Phone: [phone]   Fax:  [phone]  Physical Therapy Treatment  Patient Details  Name: Lindsey [name] MRN: [id number] Date of Birth: 08/17/1956 Referring Provider (PT): Izora Ribas, MD   Encounter Date: 11/11/2020   PT End of Session - 11/11/20 1447    Visit Number 14    Number of Visits 17    Date for PT Re-Evaluation --   POC for 10 weeks total   Authorization Type UHC Medicaid (VL: 27 between OT/PT/ST)    PT Start Time 1450    PT Stop Time 1530    PT Time Calculation (min) 40 min    Equipment Utilized During Treatment Gait belt    Activity Tolerance Patient tolerated treatment well   pt anxious/fearful   Behavior During Therapy WFL for tasks assessed/performed           Past Medical History:  Diagnosis Date  . Diverticulosis   . Hypertension   . Uterine prolapse     History reviewed. No pertinent surgical history.  There were no vitals filed for this visit.   Subjective Assessment - 11/11/20 1447    Subjective Patient reports no new changes/complaints.    Patient is accompained by: --   Caregiver   Pertinent History HTN, Alcohol Abuse    Limitations Standing;Walking    Patient Stated Goals Be able to walk    Currently in Pain? No/denies             [facility] Adult PT Treatment/Exercise - 11/11/20 0001      Transfers   Transfers Sit to Stand;Stand to Sit    Sit to Stand 4: Min guard    Sit to Stand Details Tactile cues for weight shifting;Tactile cues for placement;Verbal cues for sequencing;Verbal cues for safe use of DME/AE;Verbal cues for technique;Manual facilitation for weight shifting;Manual facilitation for placement    Stand to Sit 4: Min guard    Stand to Sit Details (indicate cue type and reason) Verbal cues for precautions/safety;Verbal cues for technique;Verbal cues for sequencing    Stand to Sit Details verbal cues to reach  back    Comments Completed sit <> stand training without RW and PT positioned in front working on improved alignment with transfer. PT providing manual faciliation at pelvis for imprvoed weight shift, and PT placing leg between feet to reduce ER of foot. Patient demo decreased ER with verbal cues for improved weight shift onto RLE. Completed x 10 reps with intermittent rest breaks as required.      Ambulation/Gait   Ambulation/Gait Yes    Ambulation/Gait Assistance 4: Min guard;4: Min assist    Ambulation/Gait Assistance Details Completed ambulation 2 x 115 ft, then 1 x 60 ft Increased spasticity noted upon standing initially but with assitance from PT and cues for deep breathing to promote relax able to place hand in orthotic. reduction in spasticity noted with increased attempt of ambulation. PT providing intermittent Min A for for RLE with fatigue. Verbal/tactile cues for improvved LLE step length to promote weight shift and improved stance. Intermittent rest breaks required due to fatigue.      Therapeutic Activites    Therapeutic Activities Other Therapeutic Activities    Other Therapeutic Activities PT educating daughter/patient on potential for Botox in RUE. OT to message MD about this. May place therapy on hold until recieve Botox to conserve visits due to visit limit. Daughter stating that she does not want  to take extended break from PT services.                  PT Education - 11/11/20 1542    Education Details see TA    Person(s) Educated Patient;Child(ren)    Methods Explanation    Comprehension Verbalized understanding            PT Short Term Goals - 10/15/20 2725      PT SHORT TERM GOAL #1   Title Patient and caregiver will report completing ambulation 1-2x/daily within the home (All STGs Due at Visit 9)    Baseline pt reports ambulating at home but just 2-3x a week    Time 2    Period Weeks    Status Not Met      PT SHORT TERM GOAL #2   Title Patient will be  able to ambulate with RW and Min A x 175 ft to demonstrate improved household mobility    Baseline 140 ft Min A RW, 95' with RW with min A    Time 2    Period Weeks    Status Not Met             PT Long Term Goals - 09/18/20 1135      PT LONG TERM GOAL #1   Title Patient will be independent with final HEP and walking program within the home with caregiver assistance(All LTGS Due at Visit 17)    Baseline conitnue to progress    Time 10    Period Weeks    Status Revised      PT LONG TERM GOAL #2   Title Patient will demo ability to complete sit <> stand with CGA and LRAD to demonstrate improved functional mobility    Baseline Min A and RW    Time 10    Period Weeks    Status On-going      PT LONG TERM GOAL #3   Title Patient wll demo ability to complete all bed mobility with CGA to demonstrate improved funcitonal mobility and reduced caregiver assistance    Baseline Mod A; Min a on 12/17    Time 10    Period Weeks    Status On-going      PT LONG TERM GOAL #4   Title Patient will demo ability to ambulate > 200 ft with LRAD and R AFO donned with CGA for improved household mobility    Baseline 140 ft with RW and AFO, Min A    Time 10    Period Weeks    Status Revised                 Plan - 11/11/20 1542    Clinical Impression Statement PT able to get RUE placed in hand orthosis today but does continue to demo increased spasticity in RUE with anxiety. Patient ambulating with CGA and intermittent Min A for RLE today. Continue to work on activites to promote improved weight shift onto RLE to promtoe improve stance phase and stability. Will conitnue to progress toward LTGs.    Personal Factors and Comorbidities Comorbidity 2    Comorbidities HTN, Alcohol Abuse, Diverticulosis    Examination-Activity Limitations Bed Mobility;Bathing;Sit;Transfers;Dressing;Stairs;Stand;Locomotion Level;Toileting    Examination-Participation Restrictions Occupation;Community  Activity;Driving    Stability/Clinical Decision Making Evolving/Moderate complexity    Rehab Potential Good    PT Frequency 1x / week    PT Duration 2 weeks   followed by 1x/week for 8 weeks beginning January (MCD Visits  Restart)   PT Treatment/Interventions ADLs/Self Care Home Management;Electrical Stimulation;DME Instruction;Gait training;Stair training;Therapeutic activities;Functional mobility training;Therapeutic exercise;Balance training;Neuromuscular re-education;Patient/family education;Orthotic Fit/Training;Wheelchair mobility training;Manual techniques;Passive range of motion    PT Next Visit Plan Update from Dr. Ranell Patrick about Botox (OT Messaged MD). Possibly go on hold to see if pt can get botox in R hand?Continue gait training working on improved weight shift, sit <> stand training, and activites to promote improved weight shift to RLE. Standing balance. Activites to promote improved hip/knee flexion on RLE.    PT Home Exercise Plan Access Code: [email or web address]    Consulted and Agree with Plan of Care Patient;Family member/caregiver    Family Member Consulted Daughter           Patient will benefit from skilled therapeutic intervention in order to improve the following deficits and impairments:  Abnormal gait,Decreased balance,Decreased mobility,Decreased endurance,Difficulty walking,Impaired sensation,Pain,Impaired UE functional use,Decreased strength,Decreased safety awareness,Decreased knowledge of use of DME,Decreased activity tolerance,Decreased range of motion,Impaired tone,Improper body mechanics  Visit Diagnosis: Muscle weakness (generalized)  Unsteadiness on feet  Difficulty in walking, not elsewhere classified  Hemiplegia and hemiparesis following other cerebrovascular disease affecting right dominant side [facility])     Problem List Patient Active Problem List   Diagnosis Date Noted  . Anemia 05/31/2020  . Acute ischemic left MCA stroke ([place]) 04/30/2020  . Right  hemiplegia ([place]) 04/30/2020  . CVA (cerebral vascular accident) ([place]) 04/21/2020  . Unspecified atrial fibrillation ([place]) 04/21/2020  . Essential hypertension 04/21/2020  . Alcohol abuse 04/21/2020    Jones Bales, PT, DPT 11/11/2020, 3:46 PM  [facility] [address] [place], [place], [address] Phone: [phone]   Fax:  [phone]  Name: Lindsey [name] MRN: [id number] Date of Birth: 1956/07/12

## 2020-11-11 NOTE — Therapy (Signed)
Lindsey [name] [address] [place], [place], [address] Phone: [phone]   Fax:  [phone]  Speech Language Pathology Treatment  Patient Details  Name: Lindsey [name] MRN: [id number] Date of Birth: March 10, 1956 Referring Provider (SLP): Dr. Leeroy Cha   Encounter Date: 11/11/2020   End of Session - 11/11/20 1558    Visit Number 7    Number of Visits 25    Date for SLP Re-Evaluation 12/23/20    Authorization Type UHC medicaid    Authorization Time Period as of today, PT has used  14    ; OT has used  9      ST has used 7 (30 total). 27 visits exceeded. Lindsey [name] aware, states we can apply for more visits per a phone call to her insurance. Will check with Lindsey [name]    SLP Start Time 1400    SLP Stop Time  7342    SLP Time Calculation (min) 42 min    Activity Tolerance Patient tolerated treatment well           Past Medical History:  Diagnosis Date  . Diverticulosis   . Hypertension   . Uterine prolapse     History reviewed. No pertinent surgical history.  There were no vitals filed for this visit.   Subjective Assessment - 11/11/20 1414    Subjective She is singing twice a day and it really helps    Patient is accompained by: Family member   Lindsey [name]   Currently in Pain? No/denies                 ADULT SLP TREATMENT - 11/11/20 1415      General Information   Behavior/Cognition Alert;Cooperative;Pleasant mood      Treatment Provided   Treatment provided Cognitive-Linquistic      Cognitive-Linquistic Treatment   Treatment focused on Aphasia;Apraxia;Patient/family/caregiver education    Skilled Treatment Intorduced Talk Path app. Demonstrated exercises in speaking, reading, writing. Trained daughter on cueing strategies when helping Lindsey [name] with the app. Instructed Lindsey [name] and Lindsey [name] to have pt say the words or phrases each time, even it the activity doesn't require speech. Lindsey [name] completed 3 exercises  on app with usual min to mod A. Lindsey [name] demonstrated cueing and tech support with rare min A. When using the app, Lindsey [name] verbalized words and sentences with 75% accuracy and intelliglble approximations. They continue to report success with Lindsey [name] writing choices and Lindsey [name] accurately selecting a preference.      Assessment / Recommendations / Plan   Plan Continue with current plan of care      Progression Toward Goals   Progression toward goals Progressing toward goals            SLP Education - 11/11/20 1552    Education Details [employer] [facility]; Talk Path app; multimodal communication    Person(s) Educated Patient;Child(ren)    Methods Explanation;Demonstration;Verbal cues    Comprehension Verbalized understanding;Verbal cues required;Need further instruction;Returned demonstration            SLP Short Term Goals - 11/11/20 1557      SLP SHORT TERM GOAL #1   Title Family will use written choices of 2 or 3 to ID pt's wants/needs with occasional min A over 2 weeks outsdie of therapy    Baseline No written cues or choices being used    Time 1    Period Weeks    Status Achieved      SLP SHORT TERM GOAL #2  Title Pt will use multimodal communication/AAC to communicate preferences for meal/snack 3x over 2 weeks with occasional min to mod A from family    Baseline no alternative communication strategies in place    Time 1    Period Weeks    Status Achieved      SLP SHORT TERM GOAL #3   Title Pt will use multimodal/AAC to ID family members she wants to communicate with or about with occasional min to mod A from family.    Time 1    Period Weeks    Status Partially Met      SLP SHORT TERM GOAL #4   Title Pt will name 3 items in simple personally relevant category using multimodal communication with occasional min A    Baseline no items in simple category    Time 1    Period Weeks    Status On-going      SLP SHORT TERM GOAL #5   Title Pt will ID 3 paraphasias with  usual min to mod A over 4 sessions    Baseline no awareness of paraphasias or perseverations    Time 4    Period Weeks    Status On-going            SLP Long Term Goals - 11/11/20 1557      SLP LONG TERM GOAL #1   Title Pt will use multimodal communication to answer 5 preferences or answer 5 questions accurately at thte word level with min A from caregivers /ST over 3 sessions    Baseline other than yes/no, pt is not indicating preferences or answering basic questions accurately    Time 7    Period Weeks    Status On-going      SLP LONG TERM GOAL #2   Title Pt will ID 5 paraphasias in a session with usual min A and self correct with frequent mod A in naming task or conversation over 2 sessions    Baseline pt unaware of paraphasias and perseverations    Time 7    Period Weeks    Status On-going      SLP LONG TERM GOAL #3   Title Caregivers will use written or photo cues at 2-3 word level to facilitate 3 turns in a conversation with pt and provide assistance in pt following a conversation topic with occasional min A over 2 sessions.    Baseline no multimodal supports in place for conversation success    Time 7    Period Weeks    Status On-going      SLP LONG TERM GOAL #4   Title Pt will name 5 items in personally relevant category using multmodal communciation with occasional min A    Baseline no items in simple category    Time 7    Period Weeks    Status On-going      SLP LONG TERM GOAL #5   Title Pt will attend aphasia conversation group (pending available technology) and family will ID 3 online/community support for persons with aphasia and caregivers    Baseline no community support identified    Time 7    Period Weeks    Status On-going            Plan - 11/11/20 1553    Clinical Impression Statement 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 Training in written choices has improved communication at home as Zamyia is able to accurately select  her preferences. Darbie has improved  verbalizations at word level with placement cues and sentnece completion. Mulitmodal communication has reduced frustration at home. As Nakaya continues to improve verbalizations and use of multi modal communication, it is medically necessary for her safety, to communicate medical concerns/pain and to reduce caregiver burden. I recommend 12 more visits of skilled ST to maximize communication.    Speech Therapy Frequency 2x / week    Duration 12 weeks    Treatment/Interventions Compensatory strategies;Patient/family education;Functional tasks;Cueing hierarchy;Environmental controls;Cognitive reorganization;Multimodal communcation approach;Language facilitation;Compensatory techniques;Internal/external aids;SLP instruction and feedback;Other (comment)    Potential to Achieve Goals Good    Potential Considerations Severity of impairments           Patient will benefit from skilled therapeutic intervention in order to improve the following deficits and impairments:   Aphasia    Problem List Patient Active Problem List   Diagnosis Date Noted  . Anemia 05/31/2020  . Acute ischemic left MCA stroke ([place]) 04/30/2020  . Right hemiplegia ([place]) 04/30/2020  . CVA (cerebral vascular accident) ([place]) 04/21/2020  . Unspecified atrial fibrillation ([place]) 04/21/2020  . Essential hypertension 04/21/2020  . Alcohol abuse 04/21/2020    Bricyn Labrada, Annye Rusk MS, CCC-SLP 11/11/2020, 4:02 PM  [facility] [address] [place], [place], [address] Phone: [phone]   Fax:  [phone]   Name: Lindsey [name] MRN: [id number] Date of Birth: Jul 25, 1956

## 2020-11-11 NOTE — Patient Instructions (Addendum)
  [employer] [facility]  [phone] -Lindsey [name] is a professor of speech therapy who runs the aphasia group. Call her and she can send you the [employer].  Talk Path App - free  See all exercises:  Speaking - noun cards  Writing - word level  Reading - complete the phrase  Make sure you are saying the words or phrases even if it is not a talking task

## 2020-11-11 NOTE — Therapy (Signed)
[facility] [address] [place], [place], [address] Phone: [phone]   Fax:  [phone]  Occupational Therapy Treatment & Recertification  Patient Details  Name: Lindsey [name] MRN: [id number] Date of Birth: 12-Jun-1956 Referring Provider (OT): Leeroy Cha, MD   Encounter Date: 11/11/2020   OT End of Session - 11/11/20 1532    Visit Number 9    Number of Visits 13   4 additional weeks after renewal   Date for OT Re-Evaluation 12/09/20    Authorization Type UHC - Medicaid    Authorization Time Period VL: 27 PT/OT/ST combined Week 8 of 8 (2/9)    Authorization - Visit Number 4   in 2022 of OT   Authorization - Number of Visits 9    OT Start Time 2263    OT Stop Time 1615    OT Time Calculation (min) 45 min    Activity Tolerance Patient tolerated treatment well    Behavior During Therapy [facility] for tasks assessed/performed;Restless           Past Medical History:  Diagnosis Date  . Diverticulosis   . Hypertension   . Uterine prolapse     History reviewed. No pertinent surgical history.  There were no vitals filed for this visit.   Subjective Assessment - 11/11/20 1738    Subjective  Pt denies any pain.    Patient is accompanied by: Family member   daughter   Pertinent History PMH significant for HTN, EtOH abuse, diverticulosis, CVA    Limitations Fall Risk. Not Driving. Aphasia. Sensory deficits RUE. Apraxia.    Patient Stated Goals get arm working better (right)    Currently in Pain? No/denies                        OT Treatments/Exercises (OP) - 11/11/20 1755      Visual/Perceptual Exercises   Scanning Tabletop    Scanning - Tabletop 100% goal met      Neurological Re-education Exercises   Other Exercises 1 AAROM on table with pillow case with RUE with very litle to no active movement noted. Pt continued working with self PROM for forward reaching, horizontal abduction and  circumduction at table top.      Modalities   Modalities Teacher, English as a foreign language Location RUE wrist   little to no response   Electrical Stimulation Action extension    Electrical Stimulation Parameters small muscle level 18 10 minutes    Electrical Stimulation Goals Neuromuscular facilitation;Tone                  OT Education - 11/11/20 1742    Education Details education on botox and visit limit in regards to therapy    Person(s) Educated Patient;Caregiver(s)    Methods Explanation    Comprehension Verbalized understanding;Need further instruction            OT Short Term Goals - 11/11/20 1547      OT SHORT TERM GOAL #1   Title Pt and caregiver will be independent with HEP12/30/21    Baseline not issued yet    Time 4    Period Weeks    Status Achieved    Target Date 10/01/20      OT SHORT TERM GOAL #2   Title Pt and caregiver will verbalize understanding of sensory strategies for safety regarding RUE with sensory deficits.    Baseline  not reviewed/issued    Time 4    Period Weeks    Status Achieved      OT SHORT TERM GOAL #3   Title Pt will complete table top scanning task with supervision and 75% accuracy.    Baseline right inatention and/or field deficits noted at eval    Time 4    Period Weeks    Status Achieved      OT SHORT TERM GOAL #4   Title Pt will complete light snack or beverage prep with set up assistance of materials that are not in reach with supervision and adapted strategies or equipment PRN    Baseline total A    Time 4    Period Weeks    Status Deferred   pt and daughter report things are not within reach of patient from wheelchair level.     OT SHORT TERM GOAL #5   Title Pt will don and doff shoes with supervision for increasing independence with basic ADLs    Baseline max A    Time 4    Period Weeks    Status On-going   mod A     OT SHORT TERM GOAL #6   Title Pt and  caregiver will verbalize and demonstrate understanding of splints and/or orthoses PRN for right hemiplegia    Baseline none issued    Time 4    Period Weeks    Status Achieved             OT Long Term Goals - 11/11/20 1532      OT LONG TERM GOAL #1   Title Pt and caregiver will be independent with updated HEP 10/29/20    Baseline not issued    Time 8    Period Weeks    Status On-going      OT LONG TERM GOAL #2   Title Pt will perform UB dressing with min A for increase in independence with ADLs    Baseline max A    Time 8    Period Weeks    Status On-going   mod A     OT LONG TERM GOAL #3   Title Pt will perform table top scanning task with 100% accuracy    Baseline inattention to right    Time 8    Period Weeks    Status Achieved      OT LONG TERM GOAL #4   Title Pt will achieve PROM of RUE shoulder flexion of 75 degrees with pain no greater than 4/10    Baseline 60 degrees pain 5/10    Time 8    Period Weeks    Status On-going      OT LONG TERM GOAL #5   Title Pt will report assisting with at least one home management or cooking activity per day in order to increase independence and decrease caregiver burden    Baseline not currently assisting    Time 8    Period Weeks    Status On-going   pt reports helping fold some laundry                Plan - 11/11/20 1725    Clinical Impression Statement Renewal completed this day and goals addressed for period of 09/03/2020 to 11/11/2020. Pt has met 4/6 STGs and 1/5 LTGs at this time. Pt is progressing towards goals. Discussion and education provided on benefit of botox at PM&R appt. Pt would benefit from Botox for RUE wrist flexors  and hand for increased spasticity and tone. Pt limited with overall active movement in RUE. Pt benefits from skilled occupational therapy to target neuromuscular reeducation in RUE for maximizing functional use and increasing safe positioning of RUE/hemi side and preventing further injury to  RUE shoulder. Pt with some pain in RUE with mobility and limited by spasticity. Pt has made some increase in independence with ADLS but continues to require moderate to max assistance for all ADLs at this time. Skilled occupational therapy is recommended to continue working towards goals.    OT Occupational Profile and History Problem Focused Assessment - Including review of records relating to presenting problem    Occupational performance deficits (Please refer to evaluation for details): ADL's;IADL's;Leisure    Body Structure / Function / Physical Skills ADL;Decreased knowledge of use of DME;Strength;Gait;GMC;Tone;Pain;Dexterity;Balance;UE functional use;Proprioception;ROM;IADL;Vision;Sensation;Coordination;FMC    Cognitive Skills Attention;Energy/Drive;Perception;Understand;Sequencing;Problem Solve    Rehab Potential Good    Clinical Decision Making Limited treatment options, no task modification necessary    Comorbidities Affecting Occupational Performance: None    Modification or Assistance to Complete Evaluation  No modification of tasks or assist necessary to complete eval    OT Frequency 1x / week    OT Duration 8 weeks   +eval   OT Treatment/Interventions Self-care/ADL training;Moist Heat;DME and/or AE instruction;Balance training;Aquatic Therapy;Therapeutic activities;Cognitive remediation/compensation;Therapeutic exercise;Visual/perceptual remediation/compensation;Passive range of motion;Neuromuscular education;Functional Mobility Training;Cryotherapy;Electrical Stimulation;Energy conservation;Manual Therapy;Patient/family education    Plan NMR RUE, check on botox status    OT Home Exercise Plan Self-PROM seated shoulder    Recommended Other Services Pt receiving ST and PT    Consulted and Agree with Plan of Care Patient;Family member/caregiver    Family Member Consulted daughter           Patient will benefit from skilled therapeutic intervention in order to improve the following  deficits and impairments:   Body Structure / Function / Physical Skills: ADL,Decreased knowledge of use of DME,Strength,Gait,GMC,Tone,Pain,Dexterity,Balance,UE functional use,Proprioception,ROM,IADL,Vision,Sensation,Coordination,FMC Cognitive Skills: Attention,Energy/Drive,Perception,Understand,Sequencing,Problem Solve     Visit Diagnosis: Attention and concentration deficit - Plan: Ot plan of care cert/re-cert  Stiffness of right wrist, not elsewhere classified - Plan: Ot plan of care cert/re-cert  Frontal lobe and executive function deficit - Plan: Ot plan of care cert/re-cert  Other lack of coordination - Plan: Ot plan of care cert/re-cert  Hemiplegia and hemiparesis following other cerebrovascular disease affecting right dominant side ([place]) - Plan: Ot plan of care cert/re-cert  Muscle weakness (generalized) - Plan: Ot plan of care cert/re-cert    Problem List Patient Active Problem List   Diagnosis Date Noted  . Anemia 05/31/2020  . Acute ischemic left MCA stroke ([place]) 04/30/2020  . Right hemiplegia ([place]) 04/30/2020  . CVA (cerebral vascular accident) ([place]) 04/21/2020  . Unspecified atrial fibrillation ([place]) 04/21/2020  . Essential hypertension 04/21/2020  . Alcohol abuse 04/21/2020    Zachery Conch MOT, OTR/L  11/11/2020, 5:59 PM  [facility] [address] [place], [place], [address] Phone: [phone]   Fax:  [phone]  Name: Jennilee Demarco MRN: [id number] Date of Birth: [birth year]

## 2020-11-12 DIAGNOSIS — G464 Cerebellar stroke syndrome: Secondary | ICD-10-CM | POA: Diagnosis not present

## 2020-11-13 ENCOUNTER — Other Ambulatory Visit: Payer: Self-pay

## 2020-11-13 ENCOUNTER — Encounter: Payer: Medicaid Other | Attending: Registered Nurse | Admitting: Physical Medicine and Rehabilitation

## 2020-11-13 ENCOUNTER — Encounter: Payer: Self-pay | Admitting: Physical Medicine and Rehabilitation

## 2020-11-13 VITALS — BP 109/71 | HR 77 | Temp 98.8°F | Ht 64.0 in

## 2020-11-13 DIAGNOSIS — I69398 Other sequelae of cerebral infarction: Secondary | ICD-10-CM | POA: Diagnosis present

## 2020-11-13 DIAGNOSIS — L602 Onychogryphosis: Secondary | ICD-10-CM | POA: Diagnosis not present

## 2020-11-13 DIAGNOSIS — R252 Cramp and spasm: Secondary | ICD-10-CM | POA: Diagnosis present

## 2020-11-13 DIAGNOSIS — G464 Cerebellar stroke syndrome: Secondary | ICD-10-CM | POA: Diagnosis not present

## 2020-11-13 NOTE — Patient Instructions (Signed)
Botox Injection for spasticity using needle Ultrasound guidance  Dilution: 1:1 Indication: Severe spasticity which interferes with ADL,mobility and/or  hygiene and is unresponsive to medication management and other conservative care  Informed consent was obtained after describing risks and benefits of the procedure with the patient. This includes bleeding, bruising, infection, excessive weakness, or medication side effects. A REMS form is on file and signed.  1) Pectoralis major: 50U Xeomin 2) Biceps Brachii: 50U divided in 2 sites 3) Flexor digitorum profundus: 20U in 1 site 5) Flexor carpi radialis: 10 U in 1 site 6) Flexor digitorum superficialis: 20U in 1 site   All injections were done after obtaining appropriate EMG activity and after negative drawback for blood. The patient tolerated the procedure well. Post procedure instructions were given. A followup appointment was made.

## 2020-11-14 DIAGNOSIS — G464 Cerebellar stroke syndrome: Secondary | ICD-10-CM | POA: Diagnosis not present

## 2020-11-18 ENCOUNTER — Other Ambulatory Visit: Payer: Self-pay

## 2020-11-18 ENCOUNTER — Ambulatory Visit: Payer: Medicaid Other

## 2020-11-18 ENCOUNTER — Ambulatory Visit: Payer: Medicaid Other | Admitting: Speech Pathology

## 2020-11-18 ENCOUNTER — Encounter: Payer: Self-pay | Admitting: Speech Pathology

## 2020-11-18 ENCOUNTER — Ambulatory Visit: Payer: Medicaid Other | Admitting: Occupational Therapy

## 2020-11-18 ENCOUNTER — Encounter: Payer: Self-pay | Admitting: Occupational Therapy

## 2020-11-18 DIAGNOSIS — M25631 Stiffness of right wrist, not elsewhere classified: Secondary | ICD-10-CM

## 2020-11-18 DIAGNOSIS — I69851 Hemiplegia and hemiparesis following other cerebrovascular disease affecting right dominant side: Secondary | ICD-10-CM

## 2020-11-18 DIAGNOSIS — M6281 Muscle weakness (generalized): Secondary | ICD-10-CM

## 2020-11-18 DIAGNOSIS — R2681 Unsteadiness on feet: Secondary | ICD-10-CM | POA: Diagnosis not present

## 2020-11-18 DIAGNOSIS — R4701 Aphasia: Secondary | ICD-10-CM

## 2020-11-18 DIAGNOSIS — R41844 Frontal lobe and executive function deficit: Secondary | ICD-10-CM

## 2020-11-18 DIAGNOSIS — R262 Difficulty in walking, not elsewhere classified: Secondary | ICD-10-CM

## 2020-11-18 NOTE — Therapy (Signed)
[facility] [address] [place], [place], [address] Phone: [phone]   Fax:  [phone]  Occupational Therapy Treatment  Patient Details  Name: Lindsey [name] MRN: [id number] Date of Birth: [birth year] Referring Provider (OT): Sula Soda, MD   Encounter Date: 11/18/2020   OT End of Session - 11/18/20 1531    Visit Number 10    Number of Visits 13   4 additional weeks after renewal   Date for OT Re-Evaluation 12/09/20    Authorization Type UHC - Medicaid    Authorization Time Period VL: 27 PT/OT/ST combined - Week 1 of 4 in renewal (11/18/20)    Authorization - Visit Number 5   in 2022 of OT   Authorization - Number of Visits 9    OT Start Time 1530    OT Stop Time 1615    OT Time Calculation (min) 45 min    Activity Tolerance Patient tolerated treatment well    Behavior During Therapy [facility] for tasks assessed/performed;Restless           Past Medical History:  Diagnosis Date  . Diverticulosis   . Hypertension   . Uterine prolapse     History reviewed. No pertinent surgical history.  There were no vitals filed for this visit.   Subjective Assessment - 11/18/20 1532    Subjective  Pt denies any pain. Pt received botox last week in RUE    Patient is accompanied by: Family member   daughter   Pertinent History PMH significant for HTN, EtOH abuse, diverticulosis, CVA    Limitations Fall Risk. Not Driving. Aphasia. Sensory deficits RUE. Apraxia.    Patient Stated Goals get arm working better (right)    Currently in Pain? No/denies                        OT Treatments/Exercises (OP) - 11/18/20 1639      Neurological Re-education Exercises   Other Exercises 1 UE ranger ofr AAROM with RUE with max facilitation AAROM eblwo extension and flexion seated edge of mat with max facilitation.    Other Weight-Bearing Exercises 1 weight bearing into RUE while seated edge of mat and reaching in  diagonal patterns, reaching for cones from floor and varying heights held by therapist. self PROM with horizontal abduction and external rotation for RUE seated edge of mat.                    OT Short Term Goals - 11/11/20 1547      OT SHORT TERM GOAL #1   Title Pt and caregiver will be independent with HEP12/30/21    Baseline not issued yet    Time 4    Period Weeks    Status Achieved    Target Date 10/01/20      OT SHORT TERM GOAL #2   Title Pt and caregiver will verbalize understanding of sensory strategies for safety regarding RUE with sensory deficits.    Baseline not reviewed/issued    Time 4    Period Weeks    Status Achieved      OT SHORT TERM GOAL #3   Title Pt will complete table top scanning task with supervision and 75% accuracy.    Baseline right inatention and/or field deficits noted at eval    Time 4    Period Weeks    Status Achieved      OT SHORT TERM GOAL #4   Title  Pt will complete light snack or beverage prep with set up assistance of materials that are not in reach with supervision and adapted strategies or equipment PRN    Baseline total A    Time 4    Period Weeks    Status Deferred   pt and daughter report things are not within reach of patient from wheelchair level.     OT SHORT TERM GOAL #5   Title Pt will don and doff shoes with supervision for increasing independence with basic ADLs    Baseline max A    Time 4    Period Weeks    Status On-going   mod A     OT SHORT TERM GOAL #6   Title Pt and caregiver will verbalize and demonstrate understanding of splints and/or orthoses PRN for right hemiplegia    Baseline none issued    Time 4    Period Weeks    Status Achieved             OT Long Term Goals - 11/11/20 1532      OT LONG TERM GOAL #1   Title Pt and caregiver will be independent with updated HEP 10/29/20    Baseline not issued    Time 8    Period Weeks    Status On-going      OT LONG TERM GOAL #2   Title Pt will  perform UB dressing with min A for increase in independence with ADLs    Baseline max A    Time 8    Period Weeks    Status On-going   mod A     OT LONG TERM GOAL #3   Title Pt will perform table top scanning task with 100% accuracy    Baseline inattention to right    Time 8    Period Weeks    Status Achieved      OT LONG TERM GOAL #4   Title Pt will achieve PROM of RUE shoulder flexion of 75 degrees with pain no greater than 4/10    Baseline 60 degrees pain 5/10    Time 8    Period Weeks    Status On-going      OT LONG TERM GOAL #5   Title Pt will report assisting with at least one home management or cooking activity per day in order to increase independence and decrease caregiver burden    Baseline not currently assisting    Time 8    Period Weeks    Status On-going   pt reports helping fold some laundry                Plan - 11/18/20 1642    Clinical Impression Statement Pt received botox in RUE since last session. Working on increasing range of motion and overall mobility of RUE.    OT Occupational Profile and History Problem Focused Assessment - Including review of records relating to presenting problem    Occupational performance deficits (Please refer to evaluation for details): ADL's;IADL's;Leisure    Body Structure / Function / Physical Skills ADL;Decreased knowledge of use of DME;Strength;Gait;GMC;Tone;Pain;Dexterity;Balance;UE functional use;Proprioception;ROM;IADL;Vision;Sensation;Coordination;FMC    Cognitive Skills Attention;Energy/Drive;Perception;Understand;Sequencing;Problem Solve    Rehab Potential Good    Clinical Decision Making Limited treatment options, no task modification necessary    Comorbidities Affecting Occupational Performance: None    Modification or Assistance to Complete Evaluation  No modification of tasks or assist necessary to complete eval    OT Frequency 1x /  week    OT Duration 8 weeks   +eval   OT Treatment/Interventions  Self-care/ADL training;Moist Heat;DME and/or AE instruction;Balance training;Aquatic Therapy;Therapeutic activities;Cognitive remediation/compensation;Therapeutic exercise;Visual/perceptual remediation/compensation;Passive range of motion;Neuromuscular education;Functional Mobility Training;Cryotherapy;Electrical Stimulation;Energy conservation;Manual Therapy;Patient/family education    Plan NMR RUE,    OT Home Exercise Plan Self-PROM seated shoulder    Recommended Other Services Pt receiving ST and PT    Consulted and Agree with Plan of Care Patient;Family member/caregiver    Family Member Consulted daughter           Patient will benefit from skilled therapeutic intervention in order to improve the following deficits and impairments:   Body Structure / Function / Physical Skills: ADL,Decreased knowledge of use of DME,Strength,Gait,GMC,Tone,Pain,Dexterity,Balance,UE functional use,Proprioception,ROM,IADL,Vision,Sensation,Coordination,FMC Cognitive Skills: Attention,Energy/Drive,Perception,Understand,Sequencing,Problem Solve     Visit Diagnosis: Stiffness of right wrist, not elsewhere classified  Muscle weakness (generalized)  Frontal lobe and executive function deficit  Unsteadiness on feet  Hemiplegia and hemiparesis following other cerebrovascular disease affecting right dominant side [facility])    Problem List Patient Active Problem List   Diagnosis Date Noted  . Anemia 05/31/2020  . Acute ischemic left MCA stroke (HCC) 04/30/2020  . Right hemiplegia (HCC) 04/30/2020  . CVA (cerebral vascular accident) (HCC) 04/21/2020  . Unspecified atrial fibrillation (HCC) 04/21/2020  . Essential hypertension 04/21/2020  . Alcohol abuse 04/21/2020    Junious Dresser MOT, OTR/L  11/18/2020, 4:43 PM  [facility] [address] [place], [place], [address] Phone: [phone]   Fax:  [phone]  Name: Meridian Scherger MRN:  [id number] Date of Birth: 23-Dec-1955

## 2020-11-18 NOTE — Patient Instructions (Signed)
  National Aphasia [employer]  [facility]  [facility] has daily on line aphasia groups called Virtual Connections  [facility] also has on line aphasia groups   Call [employer] 9-1-1 [phone] and let them know you have aphasia and may not be able to communicate easily in an emergency

## 2020-11-18 NOTE — Therapy (Signed)
[facility] [address] [place], [place], [address] Phone: [phone]   Fax:  [phone]  Physical Therapy Treatment  Patient Details  Name: Lindsey [name] MRN: [id number] Date of Birth: 1955-10-18 Referring Provider (PT): Izora Ribas, MD   Encounter Date: 11/18/2020   PT End of Session - 11/18/20 1445    Visit Number 15    Number of Visits 17    Date for PT Re-Evaluation --   POC for 10 weeks total   Authorization Type UHC Medicaid (VL: 27 between OT/PT/ST)    PT Start Time 1446    PT Stop Time 1529    PT Time Calculation (min) 43 min    Equipment Utilized During Treatment Gait belt    Activity Tolerance Patient tolerated treatment well   pt anxious/fearful   Behavior During Therapy WFL for tasks assessed/performed           Past Medical History:  Diagnosis Date  . Diverticulosis   . Hypertension   . Uterine prolapse     History reviewed. No pertinent surgical history.  There were no vitals filed for this visit.   Subjective Assessment - 11/18/20 1447    Subjective Patient got Botox in the RUE. No other new changes.    Patient is accompained by: --   Caregiver   Pertinent History HTN, Alcohol Abuse    Limitations Standing;Walking    Patient Stated Goals Be able to walk    Currently in Pain? No/denies    Pain Onset --            [facility] Adult PT Treatment/Exercise - 11/18/20 0001      Transfers   Transfers Sit to Stand;Stand to Sit    Sit to Stand 4: Min guard    Sit to Stand Details Tactile cues for weight shifting;Tactile cues for placement;Verbal cues for sequencing;Verbal cues for safe use of DME/AE;Verbal cues for technique;Manual facilitation for weight shifting;Manual facilitation for placement    Stand to Sit 4: Min guard    Stand to Sit Details (indicate cue type and reason) Verbal cues for precautions/safety;Verbal cues for technique;Verbal cues for sequencing    Comments with  standing activites completed x 5 sit <> stands from w/c with intermittent verbal cues required for proper technique      Ambulation/Gait   Ambulation/Gait Yes    Ambulation/Gait Assistance 4: Min guard;4: Min assist    Ambulation/Gait Assistance Details Completed ambulatoin with AFO donned 115 x 2 laps, with short rest break. Patient able to stand and began to place RUE in hand orthotic. Improved ability placing hand in orthotic post Botox. Verbal cues required for step length on initially lap, but demo improvements on second lap. Intermittent Min A for RLE placement.    Ambulation Distance (Feet) 115 Feet   x 2   Assistive device Rolling walker    Gait Pattern Step-through pattern;Decreased stance time - right;Decreased step length - left;Decreased dorsiflexion - right;Decreased weight shift to right    Ambulation Surface Level;Indoor      Neuro Re-ed    Neuro Re-ed Details  standing in // bars: completed static standing balance with supervision and intermittent CGA, completed x 2 minutes without UE support. Completed standing balance activity including standing withotu UE support, and patient reaching to PT hand as target in various direction completed x 3 minutes. Without UE support completed stepping forward with LLE x 10 reps to promote improved weight shift to R, intermittent challenge noted  requiring CGA from PT. Then completed x 10 reps leading with RLE to promote improved stepping strategy/step length. Increased challenge with hip extension noted, intermittent assistance required. Progressed to stepping over black beam, completed forward steps over black beam with RLE, MIn A required for placement as often catching RLE when stepping back, completed x 5 reps. Then completed x 5 reps with LLE, UE support required for completion. With 4" step completed forward steps up leading with RLE x 10 reps, PT providing CGA for posture and proper technique. Patient complted with single UE support. Patient  required intermittent rest breaks due to fatigue.                  PT Education - 11/18/20 1816    Education Details PT educating on adding 4 PT visit and continued education on VL 27.    Person(s) Educated Patient;Child(ren)    Methods Explanation    Comprehension Other (comment)   Patient reporting she would call [facility], and would speak to front office regarding authorization for additional visits           PT Short Term Goals - 10/15/20 0938      PT SHORT TERM GOAL #1   Title Patient and caregiver will report completing ambulation 1-2x/daily within the home (All STGs Due at Visit 9)    Baseline pt reports ambulating at home but just 2-3x a week    Time 2    Period Weeks    Status Not Met      PT SHORT TERM GOAL #2   Title Patient will be able to ambulate with RW and Min A x 175 ft to demonstrate improved household mobility    Baseline 140 ft Min A RW, 95' with RW with min A    Time 2    Period Weeks    Status Not Met             PT Long Term Goals - 09/18/20 1135      PT LONG TERM GOAL #1   Title Patient will be independent with final HEP and walking program within the home with caregiver assistance(All LTGS Due at Visit 17)    Baseline conitnue to progress    Time 10    Period Weeks    Status Revised      PT LONG TERM GOAL #2   Title Patient will demo ability to complete sit <> stand with CGA and LRAD to demonstrate improved functional mobility    Baseline Min A and RW    Time 10    Period Weeks    Status On-going      PT LONG TERM GOAL #3   Title Patient wll demo ability to complete all bed mobility with CGA to demonstrate improved funcitonal mobility and reduced caregiver assistance    Baseline Mod A; Min a on 12/17    Time 10    Period Weeks    Status On-going      PT LONG TERM GOAL #4   Title Patient will demo ability to ambulate > 200 ft with LRAD and R AFO donned with CGA for improved household mobility    Baseline 140 ft with RW and AFO, Min  A    Time 10    Period Weeks    Status Revised                 Plan - 11/18/20 1818    Clinical Impression Statement Patient demo improvements in  ability to place RUE in hand orthotic after botox. Continued gait training working on increased distnace, however patient continues to want to take rest break after 115'. Continued activites in // bars working toward improve stance/weight shift on RLE. Intermittent UE support and CGA required. Will continue to progress toward LTGs.    Personal Factors and Comorbidities Comorbidity 2    Comorbidities HTN, Alcohol Abuse, Diverticulosis    Examination-Activity Limitations Bed Mobility;Bathing;Sit;Transfers;Dressing;Stairs;Stand;Locomotion Level;Toileting    Examination-Participation Restrictions Occupation;Community Activity;Driving    Stability/Clinical Decision Making Evolving/Moderate complexity    Rehab Potential Good    PT Frequency 1x / week    PT Duration 2 weeks   followed by 1x/week for 8 weeks beginning January ([facility]CD Visits Restart)   PT Treatment/Interventions ADLs/Self Care Home Management;Electrical Stimulation;DME Instruction;Gait training;Stair training;Therapeutic activities;Functional mobility training;Therapeutic exercise;Balance training;Neuromuscular re-education;Patient/family education;Orthotic Fit/Training;Wheelchair mobility training;Manual techniques;Passive range of motion    PT Next Visit Plan Continue gait training working on improved weight shift, sit <> stand training, and activites to promote improved weight shift to RLE. Standing balance. Activites to promote improved hip/knee flexion on RLE.    PT Home Exercise Plan Access Code: [email or web address]    Consulted and Agree with Plan of Care Patient;Family member/caregiver    Family Member Consulted Daughter           Patient will benefit from skilled therapeutic intervention in order to improve the following deficits and impairments:  Abnormal gait,Decreased  balance,Decreased mobility,Decreased endurance,Difficulty walking,Impaired sensation,Pain,Impaired UE functional use,Decreased strength,Decreased safety awareness,Decreased knowledge of use of DME,Decreased activity tolerance,Decreased range of motion,Impaired tone,Improper body mechanics  Visit Diagnosis: Muscle weakness (generalized)  Hemiplegia and hemiparesis following other cerebrovascular disease affecting right dominant side ([place])  Unsteadiness on feet  Difficulty in walking, not elsewhere classified     Problem List Patient Active Problem List   Diagnosis Date Noted  . Anemia 05/31/2020  . Acute ischemic left MCA stroke ([place]) 04/30/2020  . Right hemiplegia ([place]) 04/30/2020  . CVA (cerebral vascular accident) ([place]) 04/21/2020  . Unspecified atrial fibrillation ([place]) 04/21/2020  . Essential hypertension 04/21/2020  . Alcohol abuse 04/21/2020    Jones Bales, PT, DPT 11/18/2020, 6:20 PM  [facility] [address] [place], [place], [address] Phone: [phone]   Fax:  [phone]  Name: Lindsey [name] MRN: [id number] Date of Birth: 06-07-1956

## 2020-11-18 NOTE — Therapy (Signed)
[facility] [address] [place], [place], [address] Phone: [phone]   Fax:  [phone]  Speech Language Pathology Treatment  Patient Details  Name: Lindsey [name] MRN: [id number] Date of Birth: 02-18-1956 Referring Provider (SLP): Dr. Leeroy Cha   Encounter Date: 11/18/2020   End of Session - 11/18/20 1611    Visit Number 8    Number of Visits 25    Date for SLP Re-Evaluation 12/23/20    Authorization Type UHC medicaid    SLP Start Time 1401    SLP Stop Time  1443    SLP Time Calculation (min) 42 min           Past Medical History:  Diagnosis Date  . Diverticulosis   . Hypertension   . Uterine prolapse     History reviewed. No pertinent surgical history.  There were no vitals filed for this visit.   Subjective Assessment - 11/18/20 1409    Subjective "I signed her up for the aphasia group"    Patient is accompained by: Family member   daughter, [name]   Currently in Pain? 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 Keondra generated 6 lunch items and 4 breakfast items with occasional min to mod semantic cues to verbalize approximations of items. [name] continues to use writing and written choices to help communicate Lindsey [name]'s preferances successfully at home. Sharai continues to use [employer] and endorses that she is saying/repeating words in all of her activities with this app      Assessment / Recommendations / [place] with current plan of care      Progression Toward Goals   Progression  toward goals Progressing toward goals            SLP Education - 11/18/20 1607    Education Details multimodal communication    Person(s) Educated Patient;Child(ren)    Methods Explanation;Demonstration;Verbal cues    Comprehension Verbalized understanding;Returned demonstration;Verbal cues required;Need further instruction            SLP Short Term Goals - 11/18/20 1609      SLP SHORT TERM GOAL #1   Title Family will use written choices of 2 or 3 to ID pt's wants/needs with occasional min A over 2 weeks outsdie of therapy    Baseline No written cues or choices being used    Time 1    Period Weeks    Status Achieved      SLP SHORT TERM GOAL #2   Title Pt will use multimodal communication/AAC to communicate preferences for meal/snack 3x over 2 weeks with occasional min to mod A from family    Baseline no alternative communication strategies in place    Time 1    Period Weeks    Status Achieved      SLP SHORT TERM GOAL #3   Title Pt will use multimodal/AAC to ID family members she wants to communicate with or about with occasional min to mod A from family.    Time 1    Period Weeks  Status Partially Met      SLP SHORT TERM GOAL #4   Title Pt will name 3 items in simple personally relevant category using multimodal communication with occasional min A    Baseline no items in simple category    Time 1    Period Weeks    Status Achieved      SLP SHORT TERM GOAL #5   Title Pt will ID 3 paraphasias with usual min to mod A over 4 sessions    Baseline no awareness of paraphasias or perseverations    Time 4    Period Weeks    Status On-going            SLP Long Term Goals - 11/18/20 1610      SLP LONG TERM GOAL #1   Title Pt will use multimodal communication to answer 5 preferences or answer 5 questions accurately at thte word level with min A from caregivers /ST over 3 sessions    Baseline other than yes/no, pt is not indicating preferences or answering basic  questions accurately    Time 6    Period Weeks    Status On-going      SLP LONG TERM GOAL #2   Title Pt will ID 5 paraphasias in a session with usual min A and self correct with frequent mod A in naming task or conversation over 2 sessions    Baseline pt unaware of paraphasias and perseverations    Time 6    Period Weeks    Status On-going      SLP LONG TERM GOAL #3   Title Caregivers will use written or photo cues at 2-3 word level to facilitate 3 turns in a conversation with pt and provide assistance in pt following a conversation topic with occasional min A over 2 sessions.    Baseline no multimodal supports in place for conversation success    Time 6    Period Weeks    Status On-going      SLP LONG TERM GOAL #4   Title Pt will name 5 items in personally relevant category using multmodal communciation with occasional min A    Baseline no items in simple category; 11/18/20    Time 6    Period Weeks    Status On-going      SLP LONG TERM GOAL #5   Title Pt will attend aphasia conversation group (pending available technology) and family will ID 3 online/community support for persons with aphasia and caregivers    Baseline no community support identified    Time 6    Period Weeks    Status On-going            Plan - 11/18/20 1609    Clinical Impression Statement 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 Training in written choices has improved communication at home as Lindsey [name] is able to accurately select her preferences. Lindsey [name] has improved verbalizations at word level with placement cues and sentnece completion. Mulitmodal communication has reduced frustration at home. As Lindsey [name] continues to improve verbalizations and use of multi modal communication, it is medically necessary for her safety, to communicate medical concerns/pain and to reduce caregiver burden. I recommend 12 more visits of skilled ST to maximize communication.    Speech Therapy Frequency 2x /  week    Duration 12 weeks    Treatment/Interventions Compensatory strategies;Patient/family education;Functional tasks;Cueing hierarchy;Environmental controls;Cognitive reorganization;Multimodal communcation approach;Language facilitation;Compensatory techniques;Internal/external aids;SLP instruction and feedback;Other (comment)    Potential to  Achieve Goals Good    Potential Considerations Severity of impairments    Consulted and Agree with Plan of Care Patient;Family member/caregiver    Family Member Consulted daughter, [name]           Patient will benefit from skilled therapeutic intervention in order to improve the following deficits and impairments:   Aphasia    Problem List Patient Active Problem List   Diagnosis Date Noted  . Anemia 05/31/2020  . Acute ischemic left MCA stroke ([place]) 04/30/2020  . Right hemiplegia ([place]) 04/30/2020  . CVA (cerebral vascular accident) ([place]) 04/21/2020  . Unspecified atrial fibrillation ([place]) 04/21/2020  . Essential hypertension 04/21/2020  . Alcohol abuse 04/21/2020    Lindsey [name], Annye Rusk MS, CCC-SLP 11/18/2020, 4:12 PM  [facility] [address] [place], [place], [address] Phone: [phone]   Fax:  [phone]   Name: Lindsey [name] MRN: [id number] Date of Birth: Oct 07, 1955

## 2020-11-19 ENCOUNTER — Ambulatory Visit (INDEPENDENT_AMBULATORY_CARE_PROVIDER_SITE_OTHER): Payer: Medicaid Other | Admitting: Podiatry

## 2020-11-19 ENCOUNTER — Encounter: Payer: Self-pay | Admitting: Podiatry

## 2020-11-19 DIAGNOSIS — M79675 Pain in left toe(s): Secondary | ICD-10-CM

## 2020-11-19 DIAGNOSIS — B351 Tinea unguium: Secondary | ICD-10-CM

## 2020-11-19 DIAGNOSIS — M79674 Pain in right toe(s): Secondary | ICD-10-CM

## 2020-11-19 NOTE — Progress Notes (Signed)
This patient returns to my office for at risk foot care.  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  She presents to the office with her daughter.This patient is unable to cut nails herself since the patient cannot reach her nails.These nails are painful walking and wearing shoes.  This patient presents for at risk foot care today.  General Appearance  Alert, conversant and in no acute stress.  Vascular  Dorsalis pedis and posterior tibial  pulses are palpable  Left.  Absent dorsalis pedis and posterior tibial pulses left foot..  Capillary return is within normal limits  bilaterally. Temperature is within normal limits left foot.  Cold and swollen foot right..  Neurologic  Senn-Weinstein monofilament wire test within normal limits  bilaterally. Muscle power within normal limits left.  Absent ROM and muscle strength right..  Nails Thick disfigured discolored nails with subungual debris  from hallux to fifth toes bilaterally. No evidence of bacterial infection or drainage bilaterally.  Orthopedic  No limitations of motion  feet .  No crepitus or effusions noted.  No bony pathology or digital deformities noted.  Skin  normotropic skin with no porokeratosis noted bilaterally.  No signs of infections or ulcers noted.     Onychomycosis  Pain in right toes  Pain in left toes  Consent was obtained for treatment procedures.   Mechanical debridement of nails 1-5  bilaterally performed with a nail nipper.  Filed with dremel without incident.    Return office visit  3 months                    Told patient to return for periodic foot care and evaluation due to potential at risk complications.   Helane Gunther DPM

## 2020-12-02 ENCOUNTER — Ambulatory Visit: Payer: Medicaid Other | Admitting: Occupational Therapy

## 2020-12-02 ENCOUNTER — Ambulatory Visit: Payer: Medicaid Other | Attending: Physical Medicine and Rehabilitation

## 2020-12-02 ENCOUNTER — Encounter: Payer: Self-pay | Admitting: Occupational Therapy

## 2020-12-02 ENCOUNTER — Other Ambulatory Visit: Payer: Self-pay

## 2020-12-02 DIAGNOSIS — R482 Apraxia: Secondary | ICD-10-CM | POA: Insufficient documentation

## 2020-12-02 DIAGNOSIS — R41844 Frontal lobe and executive function deficit: Secondary | ICD-10-CM | POA: Insufficient documentation

## 2020-12-02 DIAGNOSIS — R278 Other lack of coordination: Secondary | ICD-10-CM

## 2020-12-02 DIAGNOSIS — R41840 Attention and concentration deficit: Secondary | ICD-10-CM | POA: Diagnosis present

## 2020-12-02 DIAGNOSIS — I69851 Hemiplegia and hemiparesis following other cerebrovascular disease affecting right dominant side: Secondary | ICD-10-CM

## 2020-12-02 DIAGNOSIS — M25631 Stiffness of right wrist, not elsewhere classified: Secondary | ICD-10-CM | POA: Diagnosis present

## 2020-12-02 DIAGNOSIS — R2681 Unsteadiness on feet: Secondary | ICD-10-CM

## 2020-12-02 DIAGNOSIS — M6281 Muscle weakness (generalized): Secondary | ICD-10-CM | POA: Diagnosis present

## 2020-12-02 DIAGNOSIS — R4701 Aphasia: Secondary | ICD-10-CM | POA: Diagnosis present

## 2020-12-02 DIAGNOSIS — R262 Difficulty in walking, not elsewhere classified: Secondary | ICD-10-CM | POA: Diagnosis present

## 2020-12-02 NOTE — Therapy (Signed)
[facility] [address] [place], [place], [address] Phone: [phone]   Fax:  [phone]  Physical Therapy Treatment  Patient Details  Name: Lindsey [name] MRN: [id number] Date of Birth: 12/02/1955 Referring Provider (PT): Krutika P Raulkar, MD   Encounter Date: 12/02/2020   PT End of Session - 12/02/20 1405    Visit Number 16    Number of Visits 17    Date for PT Re-Evaluation --   POC for 10 weeks total   Authorization Type U[employer] Medicaid (VL: 27 between OT/PT/ST)    PT Start Time 1401    PT Stop Time 1445    PT Time Calculation (min) 44 min    Equipment Utilized During Treatment Gait belt    Activity Tolerance Patient tolerated treatment well   pt anxious/fearful   Behavior During Therapy WFL for tasks assessed/performed           Past Medical History:  Diagnosis Date  . Diverticulosis   . Hypertension   . Uterine prolapse     History reviewed. No pertinent surgical history.  There were no vitals filed for this visit.   Subjective Assessment - 12/02/20 1404    Subjective Patient reports no new changes/complaints. No pain to report. No falls.    Patient is accompained by: --   Caregiver   Pertinent History HTN, Alcohol Abuse    Limitations Standing;Walking    Patient Stated Goals Be able to walk    Currently in Pain? No/denies             [facility] Adult PT Treatment/Exercise - 12/02/20 0001      Transfers   Transfers Sit to Stand;Stand to Sit    Sit to Stand 4: Min guard    Sit to Stand Details Tactile cues for weight shifting;Tactile cues for placement;Verbal cues for sequencing;Verbal cues for safe use of DME/AE;Verbal cues for technique;Manual facilitation for weight shifting;Manual facilitation for placement    Sit to Stand Details (indicate cue type and reason) PT continue to provide education/tips on how to use LUE to place RLE in proper position and keep foot flat ground to allow for improved  completion of sit <> stand    Stand to Sit 4: Min guard    Stand to Sit Details (indicate cue type and reason) Verbal cues for precautions/safety;Verbal cues for technique;Verbal cues for sequencing    Comments completed sit <> stands from w/c working on maintaining balance upon standing      Ambulation/Gait   Ambulation/Gait Yes    Ambulation/Gait Assistance 4: Min guard;4: Min assist    Ambulation/Gait Assistance Details Continued ambulation with RW and continued use of hand orthotic, upon standing patient continue to require assistance to place RUE into hand orthotic. Completed gait x 230 ft, initially patient demo improved swing and placement of RLE but require intermittent Min A with fatigue. Completed ambulation x 115 ft again after activites at end of session, due to fatiuge patient require more Min A for RLE.    Ambulation Distance (Feet) 230 Feet   x 1; 115 x 1   Assistive device Rolling walker   R Hand Orthotic   Gait Pattern Step-through pattern;Decreased stance time - right;Decreased step length - left;Decreased dorsiflexion - right;Decreased weight shift to right    Ambulation Surface Level;Indoor      Neuro Re-ed    Neuro Re-ed Details  standing in // bars: completed static standing balance with supervision and intermittent CGA, completed x   1 minutes without UE support. progressed to completing horizontal/vertical head turns x 10 reps each directions, followed by EC x 15 seconds. Increased challenge with EC. On airex: completed static standing x 1 minute, followed by horizontal/vertical head turns x 10. Increased challenge on complaint surface require intermittent touchA to bars and CGA from PT. Without UE support completed stepping forward with LLE 2 x 10 reps to promote improved weight shift to R, intermittent cues required from PT to avoid dragging LLE to promote improved stance/weight shift. . With 4" step completed forward steps up leading with RLE x 10 reps, PT providing CGA for  posture and proper technique with utilization of mirror for visual feedback. Patient required intermittent rest breaks due to fatigue.              PT Short Term Goals - 10/15/20 5885      PT SHORT TERM GOAL #1   Title Patient and caregiver will report completing ambulation 1-2x/daily within the home (All STGs Due at Visit 9)    Baseline pt reports ambulating at home but just 2-3x a week    Time 2    Period Weeks    Status Not Met      PT SHORT TERM GOAL #2   Title Patient will be able to ambulate with RW and Min A x 175 ft to demonstrate improved household mobility    Baseline 140 ft Min A RW, 95' with RW with min A    Time 2    Period Weeks    Status Not Met             PT Long Term Goals - 09/18/20 1135      PT LONG TERM GOAL #1   Title Patient will be independent with final HEP and walking program within the home with caregiver assistance(All LTGS Due at Visit 17)    Baseline conitnue to progress    Time 10    Period Weeks    Status Revised      PT LONG TERM GOAL #2   Title Patient will demo ability to complete sit <> stand with CGA and LRAD to demonstrate improved functional mobility    Baseline Min A and RW    Time 10    Period Weeks    Status On-going      PT LONG TERM GOAL #3   Title Patient wll demo ability to complete all bed mobility with CGA to demonstrate improved funcitonal mobility and reduced caregiver assistance    Baseline Mod A; Min a on 12/17    Time 10    Period Weeks    Status On-going      PT LONG TERM GOAL #4   Title Patient will demo ability to ambulate > 200 ft with LRAD and R AFO donned with CGA for improved household mobility    Baseline 140 ft with RW and AFO, Min A    Time 10    Period Weeks    Status Revised                 Plan - 12/02/20 1505    Clinical Impression Statement Continued gait training today with patient able to improve ambulation distance to 230' with RW. With increased distance fatigue noted, require  intermittent Min A for RLE. Continued NMR activites in // bars working toward improved weight shift/stance on RLE. Will continue to progress toward all LTGs.    Personal Factors and Comorbidities Comorbidity 2  Comorbidities HTN, Alcohol Abuse, Diverticulosis    Examination-Activity Limitations Bed Mobility;Bathing;Sit;Transfers;Dressing;Stairs;Stand;Locomotion Level;Toileting    Examination-Participation Restrictions Occupation;Community Activity;Driving    Stability/Clinical Decision Making Evolving/Moderate complexity    Rehab Potential Good    PT Frequency 1x / week    PT Duration 2 weeks   followed by 1x/week for 8 weeks beginning January (MCD Visits Restart)   PT Treatment/Interventions ADLs/Self Care Home Management;Electrical Stimulation;DME Instruction;Gait training;Stair training;Therapeutic activities;Functional mobility training;Therapeutic exercise;Balance training;Neuromuscular re-education;Patient/family education;Orthotic Fit/Training;Wheelchair mobility training;Manual techniques;Passive range of motion    PT Next Visit Plan Patient will need a Re-Cert for Last Two Visits. Continue gait training working on improved weight shift, sit <> stand training, and activites to promote improved weight shift to RLE. Standing balance. Activites to promote improved hip/knee flexion on RLE.    PT Home Exercise Plan Access Code: [email or web address]    Consulted and Agree with Plan of Care Patient;Family member/caregiver    Family Member Consulted Daughter           Patient will benefit from skilled therapeutic intervention in order to improve the following deficits and impairments:  Abnormal gait,Decreased balance,Decreased mobility,Decreased endurance,Difficulty walking,Impaired sensation,Pain,Impaired UE functional use,Decreased strength,Decreased safety awareness,Decreased knowledge of use of DME,Decreased activity tolerance,Decreased range of motion,Impaired tone,Improper body mechanics  Visit  Diagnosis: Difficulty in walking, not elsewhere classified  Muscle weakness (generalized)  Unsteadiness on feet  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 Pain due to onychomycosis of toenails of both feet 11/19/2020  . Anemia 05/31/2020  . Acute ischemic left MCA stroke (HCC) 04/30/2020  . Right hemiplegia (HCC) 04/30/2020  . CVA (cerebral vascular accident) (HCC) 04/21/2020  . Unspecified atrial fibrillation (HCC) 04/21/2020  . Essential hypertension 04/21/2020  . Alcohol abuse 04/21/2020    Kaitlyn B Martin, PT, DPT 12/02/2020, 3:09 PM  [facility] [address] [place], [place], [address] Phone: [phone]   Fax:  [phone]  Name: Lindsey [name] MRN: [id number] Date of Birth: 06/09/1956   

## 2020-12-02 NOTE — Therapy (Signed)
[facility] [address] [place], [place], [address] Phone: [phone]   Fax:  [phone]  Occupational Therapy Treatment  Patient Details  Name: Lindsey [name] MRN: [id number] Date of Birth: 04-16-1956 Referring Provider (OT): Sula Soda, MD   Encounter Date: 12/02/2020   OT End of Session - 12/02/20 1447    Visit Number 11    Number of Visits 13   4 additional weeks after renewal   Date for OT Re-Evaluation 12/09/20    Authorization Type U[employer] - Medicaid    Authorization Time Period VL: 27 PT/OT/ST combined - Week 2 of 4 in renewal (12/02/20)    Authorization - Visit Number 6   in 2022 of OT   Authorization - Number of Visits 9    OT Start Time 1445    OT Stop Time 1526    OT Time Calculation (min) 41 min    Activity Tolerance Patient tolerated treatment well    Behavior During Therapy [facility] for tasks assessed/performed;Restless           Past Medical History:  Diagnosis Date  . Diverticulosis   . Hypertension   . Uterine prolapse     History reviewed. No pertinent surgical history.  There were no vitals filed for this visit.   Subjective Assessment - 12/02/20 1446    Subjective  Pt denies any pain.    Patient is accompanied by: Family member   daughter   Pertinent History PMH significant for HTN, EtOH abuse, diverticulosis, CVA    Limitations Fall Risk. Not Driving. Aphasia. Sensory deficits RUE. Apraxia.    Patient Stated Goals get arm working better (right)    Currently in Pain? No/denies                        OT Treatments/Exercises (OP) - 12/02/20 1528      Bed Mobility   Supine to Sit Minimal Assistance - Patient > 75%    Sit to Supine Supervision/Verbal cueing      ADLs   ADL Comments sleeping positions were reviewed and demonstrated with daughter present      Neurological Re-education Exercises   Other Exercises 1 side lying on right side for weight bearing into RUE  shoulder and elbow and facilitating active movement in RUE at shoulder, elbow, wrist and hand. Pt with little activation noted. Synergy/tone patterns in tricep and wrist extension and hand composite flexion.                  OT Education - 12/02/20 1545    Education Details reviewed sleep positions and RUE management of hemi arm while sitting in chair    Person(s) Educated Patient;Caregiver(s)    Methods Explanation;Demonstration    Comprehension Verbalized understanding;Need further instruction;Returned demonstration            OT Short Term Goals - 11/11/20 1547      OT SHORT TERM GOAL #1   Title Pt and caregiver will be independent with HEP12/30/21    Baseline not issued yet    Time 4    Period Weeks    Status Achieved    Target Date 10/01/20      OT SHORT TERM GOAL #2   Title Pt and caregiver will verbalize understanding of sensory strategies for safety regarding RUE with sensory deficits.    Baseline not reviewed/issued    Time 4    Period Weeks    Status Achieved  OT SHORT TERM GOAL #3   Title Pt will complete table top scanning task with supervision and 75% accuracy.    Baseline right inatention and/or field deficits noted at eval    Time 4    Period Weeks    Status Achieved      OT SHORT TERM GOAL #4   Title Pt will complete light snack or beverage prep with set up assistance of materials that are not in reach with supervision and adapted strategies or equipment PRN    Baseline total A    Time 4    Period Weeks    Status Deferred   pt and daughter report things are not within reach of patient from wheelchair level.     OT SHORT TERM GOAL #5   Title Pt will don and doff shoes with supervision for increasing independence with basic ADLs    Baseline max A    Time 4    Period Weeks    Status On-going   mod A     OT SHORT TERM GOAL #6   Title Pt and caregiver will verbalize and demonstrate understanding of splints and/or orthoses PRN for right  hemiplegia    Baseline none issued    Time 4    Period Weeks    Status Achieved             OT Long Term Goals - 11/11/20 1532      OT LONG TERM GOAL #1   Title Pt and caregiver will be independent with updated HEP 10/29/20    Baseline not issued    Time 8    Period Weeks    Status On-going      OT LONG TERM GOAL #2   Title Pt will perform UB dressing with min A for increase in independence with ADLs    Baseline max A    Time 8    Period Weeks    Status On-going   mod A     OT LONG TERM GOAL #3   Title Pt will perform table top scanning task with 100% accuracy    Baseline inattention to right    Time 8    Period Weeks    Status Achieved      OT LONG TERM GOAL #4   Title Pt will achieve PROM of RUE shoulder flexion of 75 degrees with pain no greater than 4/10    Baseline 60 degrees pain 5/10    Time 8    Period Weeks    Status On-going      OT LONG TERM GOAL #5   Title Pt will report assisting with at least one home management or cooking activity per day in order to increase independence and decrease caregiver burden    Baseline not currently assisting    Time 8    Period Weeks    Status On-going   pt reports helping fold some laundry                Plan - 12/02/20 1557    Clinical Impression Statement Pt continues to progress with hemi positioning and safety for protecting hemi-side. Pt benefitted from sleep positions and reviewing positioning of RUE for sitting.    OT Occupational Profile and History Problem Focused Assessment - Including review of records relating to presenting problem    Occupational performance deficits (Please refer to evaluation for details): ADL's;IADL's;Leisure    Body Structure / Function / Physical Skills ADL;Decreased knowledge of use  of DME;Strength;Gait;GMC;Tone;Pain;Dexterity;Balance;UE functional use;Proprioception;ROM;IADL;Vision;Sensation;Coordination;FMC    Cognitive Skills  Attention;Energy/Drive;Perception;Understand;Sequencing;Problem Solve    Rehab Potential Good    Clinical Decision Making Limited treatment options, no task modification necessary    Comorbidities Affecting Occupational Performance: None    Modification or Assistance to Complete Evaluation  No modification of tasks or assist necessary to complete eval    OT Frequency 1x / week    OT Duration 8 weeks   +eval   OT Treatment/Interventions Self-care/ADL training;Moist Heat;DME and/or AE instruction;Balance training;Aquatic Therapy;Therapeutic activities;Cognitive remediation/compensation;Therapeutic exercise;Visual/perceptual remediation/compensation;Passive range of motion;Neuromuscular education;Functional Mobility Training;Cryotherapy;Electrical Stimulation;Energy conservation;Manual Therapy;Patient/family education    Plan NMR RUE, review hemi positioning for sitting in chair and sleeping    OT Home Exercise Plan Self-PROM seated shoulder    Recommended Other Services Pt receiving ST and PT    Consulted and Agree with Plan of Care Patient;Family member/caregiver    Family Member Consulted daughter           Patient will benefit from skilled therapeutic intervention in order to improve the following deficits and impairments:   Body Structure / Function / Physical Skills: ADL,Decreased knowledge of use of DME,Strength,Gait,GMC,Tone,Pain,Dexterity,Balance,UE functional use,Proprioception,ROM,IADL,Vision,Sensation,Coordination,FMC Cognitive Skills: Attention,Energy/Drive,Perception,Understand,Sequencing,Problem Solve     Visit Diagnosis: Frontal lobe and executive function deficit  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 Pain due to onychomycosis of toenails of both feet 11/19/2020  . Anemia 05/31/2020  . Acute ischemic left MCA stroke (HCC) 04/30/2020  . Right hemiplegia (HCC) 04/30/2020  . CVA (cerebral vascular accident) (HCC) 04/21/2020  . Unspecified atrial fibrillation (HCC) 04/21/2020  . Essential hypertension 04/21/2020  . Alcohol abuse 04/21/2020    Junious Dresser MOT, OTR/L  12/02/2020, 4:05 PM  [facility] [address] [place], [place], [address] Phone: [phone]   Fax:  [phone]  Name: Lindsey [name] MRN: [id number] Date of Birth: 1956/01/19

## 2020-12-16 ENCOUNTER — Encounter: Payer: Self-pay | Admitting: Physical Therapy

## 2020-12-16 ENCOUNTER — Encounter: Payer: Self-pay | Admitting: Occupational Therapy

## 2020-12-16 ENCOUNTER — Other Ambulatory Visit: Payer: Self-pay

## 2020-12-16 ENCOUNTER — Encounter: Payer: Medicaid Other | Attending: Registered Nurse | Admitting: Physical Medicine and Rehabilitation

## 2020-12-16 ENCOUNTER — Ambulatory Visit: Payer: Medicaid Other | Admitting: Physical Therapy

## 2020-12-16 ENCOUNTER — Ambulatory Visit: Payer: Medicaid Other | Admitting: Occupational Therapy

## 2020-12-16 ENCOUNTER — Encounter: Payer: Self-pay | Admitting: Physical Medicine and Rehabilitation

## 2020-12-16 VITALS — BP 124/85 | HR 71 | Temp 98.9°F | Ht 64.0 in

## 2020-12-16 DIAGNOSIS — I69851 Hemiplegia and hemiparesis following other cerebrovascular disease affecting right dominant side: Secondary | ICD-10-CM

## 2020-12-16 DIAGNOSIS — R262 Difficulty in walking, not elsewhere classified: Secondary | ICD-10-CM | POA: Diagnosis not present

## 2020-12-16 DIAGNOSIS — I10 Essential (primary) hypertension: Secondary | ICD-10-CM | POA: Insufficient documentation

## 2020-12-16 DIAGNOSIS — R4701 Aphasia: Secondary | ICD-10-CM | POA: Insufficient documentation

## 2020-12-16 DIAGNOSIS — R2681 Unsteadiness on feet: Secondary | ICD-10-CM

## 2020-12-16 DIAGNOSIS — R252 Cramp and spasm: Secondary | ICD-10-CM | POA: Diagnosis not present

## 2020-12-16 DIAGNOSIS — R278 Other lack of coordination: Secondary | ICD-10-CM

## 2020-12-16 DIAGNOSIS — I69398 Other sequelae of cerebral infarction: Secondary | ICD-10-CM | POA: Diagnosis not present

## 2020-12-16 DIAGNOSIS — R41844 Frontal lobe and executive function deficit: Secondary | ICD-10-CM

## 2020-12-16 DIAGNOSIS — M6281 Muscle weakness (generalized): Secondary | ICD-10-CM

## 2020-12-16 DIAGNOSIS — I63512 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 middle cerebral artery: Secondary | ICD-10-CM | POA: Insufficient documentation

## 2020-12-16 DIAGNOSIS — G8191 Hemiplegia, unspecified affecting right dominant side: Secondary | ICD-10-CM | POA: Insufficient documentation

## 2020-12-16 NOTE — Therapy (Signed)
[facility] [address] [place], [place], [address] Phone: [phone]   Fax:  [phone]  Physical Therapy Treatment/Re-Cert  Patient Details  Name: Lindsey [name] MRN: [id number] Date of Birth: 08/23/1956 Referring Provider (PT): Krutika P Raulkar, MD   Encounter Date: 12/16/2020   PT End of Session - 12/17/20 0859    Visit Number 17    Number of Visits 18    Date for PT Re-Evaluation --   POC for 10 weeks total   Authorization Type U[employer] Medicaid (VL: 27 between OT/PT/ST)    Authorization - Visit Number 8    Authorization - Number of Visits 9    PT Start Time 1534    PT Stop Time 1615    PT Time Calculation (min) 41 min    Equipment Utilized During Treatment Gait belt    Activity Tolerance Patient tolerated treatment well    Behavior During Therapy WFL for tasks assessed/performed           Past Medical History:  Diagnosis Date  . Diverticulosis   . Hypertension   . Uterine prolapse     History reviewed. No pertinent surgical history.  There were no vitals filed for this visit.   Subjective Assessment - 12/16/20 1538    Subjective Has been walking every day at home.    Patient is accompained by: --   Caregiver   Pertinent History HTN, Alcohol Abuse    Limitations Standing;Walking    Patient Stated Goals Be able to walk    Currently in Pain? No/denies              [facility] PT Assessment - 12/17/20 0901      Assessment   Medical Diagnosis Acute L MCA CVA    Referring Provider (PT) Krutika P Raulkar, MD      Prior Function   Level of Independence Independent                         [facility] Adult PT Treatment/Exercise - 12/17/20 0901      Bed Mobility   Supine to Sit Minimal Assistance - Patient > 75%   cues for hooklying > L sidelying before coming up to sitting   Sit to Supine Supervision/Verbal cueing      Transfers   Transfers Sit to Stand;Stand to Sit    Sit to Stand 4:  Min guard    Sit to Stand Details Tactile cues for weight shifting;Tactile cues for placement;Verbal cues for sequencing;Verbal cues for technique;Verbal cues for safe use of DME/AE    Sit to Stand Details (indicate cue type and reason) needs cues for proper placement of RLE prior to standing    Stand to Sit 4: Min guard    Stand to Sit Details (indicate cue type and reason) Verbal cues for precautions/safety;Verbal cues for technique;Verbal cues for sequencing    Stand to Sit Details cues to remove hand from orthosis prior to sitting      Ambulation/Gait   Ambulation/Gait Yes    Ambulation/Gait Assistance 4: Min guard;4: Min assist    Ambulation/Gait Assistance Details gait with RW, hand orthosis, and R AFO - pt needing little assistance today to place RUE in orthosis, min A provided from therapist for pelvic alignment due to decr anterior pelvic rotation on R, otherwise pt able to ambulate with CGA, needing min A towards end of bout of gait for incr foot clearance    Ambulation   Distance (Feet) 230 Feet    Assistive device Rolling walker    Gait Pattern Step-through pattern;Decreased stance time - right;Decreased step length - left;Decreased dorsiflexion - right;Decreased weight shift to right    Ambulation Surface Level;Indoor               Access Code: [email or web address] URL: [email or web address] Date: 12/16/2020 Prepared by: Janann August  Began to finalize pt's HEP as pt's last authorized medicaid visit will be next session.   Exercises Supine Heel Slide - 1 x daily - 5 x weekly - 1 sets - 10 reps - also performed in seated, performed with a towel under pt's foot to decr friction, need for AAROM, daughter watching and verbalized understanding.  Supine Bridge - 2 x daily - 5 x weekly - 1 sets - 10 reps - verbal and tactile cues to lift hips up first with RLE  Seated Long Arc Quad - 2 x daily - 5 x weekly - 2 sets - 10 reps Supine Ankle Dorsiflexion Stretch with Caregiver  - 2 x daily - 5 x weekly - 3 sets - 20 hold Hooklying Single Leg Bent Knee Fallouts with Resistance - 1 x daily - 7 x weekly - 2 sets - 5 reps - performed first with no resistance, then performed with yellow t band resistance, cues to keep LLE still.  Supine March - 1 x daily - 7 x weekly - 1 sets - 10 reps - with RLE, needing assist from therapist to help keep RLE in position.      PT Education - 12/17/20 0915    Education Details discussed Medicaid visits (this is visit 8 of this year, next visit will be visit 9 for PT and will discharge at next session), provided information for [facility] for pro bono services from [employer] and this other program [facility] is doing for neurological patients with their PT program.    Person(s) Educated Patient;Child(ren)    Methods Explanation;Demonstration    Comprehension Verbalized understanding;Returned demonstration            PT Short Term Goals - 10/15/20 5409      PT SHORT TERM GOAL #1   Title Patient and caregiver will report completing ambulation 1-2x/daily within the home (All STGs Due at Visit 9)    Baseline pt reports ambulating at home but just 2-3x a week    Time 2    Period Weeks    Status Not Met      PT SHORT TERM GOAL #2   Title Patient will be able to ambulate with RW and Min A x 175 ft to demonstrate improved household mobility    Baseline 140 ft Min A RW, 95' with RW with min A    Time 2    Period Weeks    Status Not Met             PT Long Term Goals - 12/17/20 8119      PT LONG TERM GOAL #1   Title Patient will be independent with final HEP and walking program within the home with caregiver assistance(All LTGS Due at Visit 17)    Baseline began to finalize HEP this session - will finish at next session    Time 10    Period Weeks    Status On-going      PT LONG TERM GOAL #2   Title Patient will demo ability to complete sit <> stand with CGA and LRAD to demonstrate improved functional mobility  Baseline met with CGA.     Time 10    Period Weeks    Status Achieved      PT LONG TERM GOAL #3   Title Patient will demo ability to complete all bed mobility with CGA to demonstrate improved funcitonal mobility and reduced caregiver assistance    Baseline needs supervision/CGA for sit > supine, from supine > sit requires min A    Time 10    Period Weeks    Status Partially Met      PT LONG TERM GOAL #4   Title Patient will demo ability to ambulate > 200 ft with LRAD and R AFO donned with CGA for improved household mobility    Baseline 230' ft with RW and AFO, CGA/Min A (when more fatigued)    Time 10    Period Weeks    Status Not Met            On-going LTG for re-cert:  PT Long Term Goals - 12/17/20 3086      PT LONG TERM GOAL #1   Title Patient will be independent with final HEP and walking program within the home with caregiver assistance(All LTGS Due at Visit 18)    Baseline began to finalize HEP this session - will finish at next session    Time 18   th visit   Status On-going               Plan - 12/17/20 0923    Clinical Impression Statement Focus of today's skilled session was beginning to check LTGs. Pt able to perform sit <> stands with CGA, does need initial cueing for proper placement on RLE to improve weight bearing. Pt able to ambulate 230' with RW - needing CGA/min A (when more fatigued and assist for proper pelvic alignment). After pt received Botox, pt able to place R hand in orthosis with minimal assistance. Provided resources to pt's daughter about pro bono options after pt is out of Medicaid visits. This is visit 8 out of 9 for Medicaid authorized visits. Began to review and add onto final HEP and will finalize at next session. Pt and pt's daughter in agreement. LTGs revised for one additional visit.    Personal Factors and Comorbidities Comorbidity 2    Comorbidities HTN, Alcohol Abuse, Diverticulosis    Examination-Activity Limitations Bed  Mobility;Bathing;Sit;Transfers;Dressing;Stairs;Stand;Locomotion Level;Toileting    Examination-Participation Restrictions Occupation;Community Activity;Driving    Stability/Clinical Decision Making Evolving/Moderate complexity    Rehab Potential Good    PT Frequency 1x / week    PT Duration --   1 week - last authorized Medicaid visit   PT Treatment/Interventions ADLs/Self Care Home Management;Electrical Stimulation;DME Instruction;Gait training;Stair training;Therapeutic activities;Functional mobility training;Therapeutic exercise;Balance training;Neuromuscular re-education;Patient/family education;Orthotic Fit/Training;Wheelchair mobility training;Manual techniques;Passive range of motion    PT Next Visit Plan finalize HEP.    PT Home Exercise Plan Access Code: [email or web address]    Consulted and Agree with Plan of Care Patient;Family member/caregiver    Family Member Consulted Daughter           Patient will benefit from skilled therapeutic intervention in order to improve the following deficits and impairments:  Abnormal gait,Decreased balance,Decreased mobility,Decreased endurance,Difficulty walking,Impaired sensation,Pain,Impaired UE functional use,Decreased strength,Decreased safety awareness,Decreased knowledge of use of DME,Decreased activity tolerance,Decreased range of motion,Impaired tone,Improper body mechanics  Visit Diagnosis: 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 Pain due to onychomycosis of toenails of both feet 11/19/2020  . Anemia 05/31/2020  . Acute ischemic left MCA stroke ([place]) 04/30/2020  . Right hemiplegia ([place]) 04/30/2020  . CVA (cerebral vascular accident) ([place]) 04/21/2020  . Unspecified atrial fibrillation ([place]) 04/21/2020  .  Essential hypertension 04/21/2020  . Alcohol abuse 04/21/2020    Arliss Journey, PT, DPT  12/17/2020, 9:26 AM  [facility] [address] [place], [place], [address] Phone: [phone]   Fax:  [phone]  Name: Lilyth Lawyer MRN: [id number] Date of Birth: Aug 07, 1956

## 2020-12-16 NOTE — Therapy (Signed)
[facility] [address] [place], [place], [address] Phone: [phone]   Fax:  [phone]  Occupational Therapy Treatment  Patient Details  Name: Lindsey [name] MRN: [id number] Date of Birth: 28-Oct-1955 Referring Provider (OT): Sula Soda, MD   Encounter Date: 12/16/2020   OT End of Session - 12/16/20 1621    Visit Number 12    Number of Visits 13   4 additional weeks after renewal   Date for OT Re-Evaluation 12/09/20    Authorization Type [employer] - Medicaid    Authorization Time Period VL: 27 PT/OT/ST combined - Week 3 of 4 in renewal (12/16/20)    Authorization - Visit Number 7   in 2022 of OT   Authorization - Number of Visits 9    OT Start Time 1619    OT Stop Time 1700    OT Time Calculation ([name]) 41 [name]    Activity Tolerance Patient tolerated treatment well    Behavior During Therapy [facility] for tasks assessed/performed;Restless           Past Medical History:  Diagnosis Date  . Diverticulosis   . Hypertension   . Uterine prolapse     History reviewed. No pertinent surgical history.  There were no vitals filed for this visit.   Subjective Assessment - 12/16/20 1621    Subjective  pt denies any pain    Patient is accompanied by: Family member   daughter   Pertinent History PMH significant for HTN, EtOH abuse, diverticulosis, CVA    Limitations Fall Risk. Not Driving. Aphasia. Sensory deficits RUE. Apraxia.    Patient Stated Goals get arm working better (right)    Currently in Pain? No/denies               Treatment:  Neuromuscular Reeducation for RUE - weight bearing into RUE while reaching to floor and up to ceiling. Pt with increased difficulty with trunk/hip dissociation. Pt demonstrated increased isometric hold and activation with elbow extension/shoulder flexion hold in gravity eliminated position this day with use of UE ranger in side lying.  Standing balance addressed with standing  with rolling walker and reaching to knees in front, right, left and behind for increasing standing balance with clothing management simulation. SBA required but no physical assistance needed this day.                    OT Short Term Goals - 11/11/20 1547      OT SHORT TERM GOAL #1   Title Pt and caregiver will be independent with HEP12/30/21    Baseline not issued yet    Time 4    Period Weeks    Status Achieved    Target Date 10/01/20      OT SHORT TERM GOAL #2   Title Pt and caregiver will verbalize understanding of sensory strategies for safety regarding RUE with sensory deficits.    Baseline not reviewed/issued    Time 4    Period Weeks    Status Achieved      OT SHORT TERM GOAL #3   Title Pt will complete table top scanning task with supervision and 75% accuracy.    Baseline right inatention and/or field deficits noted at eval    Time 4    Period Weeks    Status Achieved      OT SHORT TERM GOAL #4   Title Pt will complete light snack or beverage prep with set up assistance of materials  that are not in reach with supervision and adapted strategies or equipment PRN    Baseline total A    Time 4    Period Weeks    Status Deferred   pt and daughter report things are not within reach of patient from wheelchair level.     OT SHORT TERM GOAL #5   Title Pt will don and doff shoes with supervision for increasing independence with basic ADLs    Baseline max A    Time 4    Period Weeks    Status On-going   mod A     OT SHORT TERM GOAL #6   Title Pt and caregiver will verbalize and demonstrate understanding of splints and/or orthoses PRN for right hemiplegia    Baseline none issued    Time 4    Period Weeks    Status Achieved             OT Long Term Goals - 11/11/20 1532      OT LONG TERM GOAL #1   Title Pt and caregiver will be independent with updated HEP 10/29/20    Baseline not issued    Time 8    Period Weeks    Status On-going      OT LONG  TERM GOAL #2   Title Pt will perform UB dressing with [name] A for increase in independence with ADLs    Baseline max A    Time 8    Period Weeks    Status On-going   mod A     OT LONG TERM GOAL #3   Title Pt will perform table top scanning task with 100% accuracy    Baseline inattention to right    Time 8    Period Weeks    Status Achieved      OT LONG TERM GOAL #4   Title Pt will achieve PROM of RUE shoulder flexion of 75 degrees with pain no greater than 4/10    Baseline 60 degrees pain 5/10    Time 8    Period Weeks    Status On-going      OT LONG TERM GOAL #5   Title Pt will report assisting with at least one home management or cooking activity per day in order to increase independence and decrease caregiver burden    Baseline not currently assisting    Time 8    Period Weeks    Status On-going   pt reports helping fold some laundry                Plan - 12/16/20 1722    Clinical Impression Statement Pt continues to work towards neuromuscular reeducation and standing balance for ADLs.    OT Occupational Profile and History Problem Focused Assessment - Including review of records relating to presenting problem    Occupational performance deficits (Please refer to evaluation for details): ADL's;IADL's;Leisure    Body Structure / Function / Physical Skills ADL;Decreased knowledge of use of DME;Strength;Gait;GMC;Tone;Pain;Dexterity;Balance;UE functional use;Proprioception;ROM;IADL;Vision;Sensation;Coordination;FMC    Cognitive Skills Attention;Energy/Drive;Perception;Understand;Sequencing;Problem Solve    Rehab Potential Good    Clinical Decision Making Limited treatment options, no task modification necessary    Comorbidities Affecting Occupational Performance: None    Modification or Assistance to Complete Evaluation  No modification of tasks or assist necessary to complete eval    OT Frequency 1x / week    OT Duration 8 weeks   +eval   OT Treatment/Interventions  Self-care/ADL training;Moist Heat;DME and/or AE  instruction;Balance training;Aquatic Therapy;Therapeutic activities;Cognitive remediation/compensation;Therapeutic exercise;Visual/perceptual remediation/compensation;Passive range of motion;Neuromuscular education;Functional Mobility Training;Cryotherapy;Electrical Stimulation;Energy conservation;Manual Therapy;Patient/family education    Plan RUE NMR, standing balance for ADLs.    OT Home Exercise Plan Self-PROM seated shoulder    Recommended Other Services Pt receiving ST and PT    Consulted and Agree with Plan of Care Patient;Family member/caregiver    Family Member Consulted daughter           Patient will benefit from skilled therapeutic intervention in order to improve the following deficits and impairments:   Body Structure / Function / Physical Skills: ADL,Decreased knowledge of use of DME,Strength,Gait,GMC,Tone,Pain,Dexterity,Balance,UE functional use,Proprioception,ROM,IADL,Vision,Sensation,Coordination,FMC Cognitive Skills: Attention,Energy/Drive,Perception,Understand,Sequencing,Problem Solve     Visit Diagnosis: Frontal lobe and executive function deficit  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 Pain due to onychomycosis of toenails of both feet 11/19/2020  . Anemia 05/31/2020  . Acute ischemic left MCA stroke (HCC) 04/30/2020  . Right hemiplegia (HCC) 04/30/2020  . CVA (cerebral vascular accident) (HCC) 04/21/2020  . Unspecified atrial fibrillation (HCC) 04/21/2020  . Essential hypertension 04/21/2020  . Alcohol abuse 04/21/2020    Junious Dresser MOT, OTR/L  12/16/2020, 5:24 PM  [facility] [facility] [address] [place], [place],  [address] Phone: [phone]   Fax:  [phone]  Name: Lindsey [name] MRN: [id number] Date of Birth: 06-Nov-1955

## 2020-12-16 NOTE — Progress Notes (Signed)
Subjective:    Patient ID: Lindsey [name], female    DOB: 08-Jul-1956, 65 y.o.   MRN: [id number]  HPI: Lindsey [name] is a 65 y.o. female who is here for follow-up of her ischemic left MCA stroke, Right Hemiplegia and Essential Hypertension. Lindsey [name] was brought to [facility] ED on 07/202/2021 with right sided weakness and slurred speech.   1) HTN: -Her BP today is MUCH better controlled at 124/85- commended patient! -Her daughter brings in her BP log and her pressures have been pretty high on this.  -She continues to take the amlodipine and lopressor.  -All medications reviewed and she does not require refills.  2) Right sided hemiplegia secondary to left MCA stroke: -she had excellent improvement with Botox 100U without side effects! Continues to have spasticity on this side, currently wearing splint, discussed increasing to 200U units next visit and she is agreeable. -she continues therapy.  3)Aphasia  -improving -did not benefit from aphasia support group, but is interested in our [facility] support group  Eating well.   Sleeping well.   Using Mirtazepine as needed.   Prior history: She has been working on balance in therapy. She had her SLP evaluation and next week has her OT eval next week. She has been ambulating. She needs assistance to walk and her daughter does not feel comfortable walking with her on her own- right now she ambulates mostly with physical therapy.   She his using the bedside commode. Her daughter has been helping her. She works evening.   [name] says things have been going well at home.   Moving bowels regularly.   Her BP is low in office to 100/69. Her daughter has ordered a BP machine.   She does not need any refills this visit.   Denies pain at this time. Has been taking the Gabapentin BID  Denies depression but does have sadness  Has been eating well.    CT Head WO Contrast:  IMPRESSION: 1. There is a large hypodensity of the  anterior left MCA territory, findings consistent with acute to subacute left MCA territory infarction. ASPECTS 4.  2.  No evidence of hemorrhage.  3. There is mild mass effect on the left lateral ventricle with approximately 3 mm left right midline shift.  CT ANGIO Head/Neck:  IMPRESSION: 1. 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 Acute occlusion inferior division left M2  . 3. No other intracranial stenosis 4. No extracranial stenosis in the carotid or vertebral arteries. 5. These results were called by telephone at the time of interpretation on 04/21/2020 at 6:59 pm to provider JOSHUA LONG , who verbally acknowledged these results.  MRI Brain:  IMPRESSION: Acute infarct left MCA territory there is mild hemorrhage in the infarct which is not seen on recent CT earlier today.  Lindsey [name] was admitted to Inpatient Rehabilitation on 04/30/2020 and discharged home on 05/29/2020. She is receiving [facility] with [facility]. Lindsey [name] was ale to answer yes and no questions, her speech noted to be Language of confusion at times. This provider spoke with Lindsey [name] daughter [name], after assessment, she answered all provider questions and provider answered her questions. LindseyLindsey [name] was unable to be in room with her mother Lindsey [name] due to being febrile, she was encouraged to F/U with her doctor as well, she verbalizes understanding.   Pain Inventory Average Pain 5 Pain Right Now 0 My pain is intermittent, sharp, burning, tingling and aching  LOCATION OF PAIN    BOWEL Number of stools per week: 35  Oral laxative use No  Type of laxative  Enema or suppository use No  History of colostomy No  Incontinent No   BLADDER Pads In and out cath, frequency  Able to self cath No  Bladder incontinence Yes  Frequent urination No  Leakage with coughing No  Difficulty starting stream No  Incomplete bladder emptying No    Mobility ability to climb steps?  no do you  drive?  no use a wheelchair needs help with transfers - Function disabled: date disabled 04/2020 I need assistance with the following:  feeding, dressing, bathing, toileting, meal prep, household duties and shopping  Neuro/Psych trouble walking confusion depression  Prior Studies Any changes since last visit?  no  Physicians involved in your care Any changes since last visit?  no   Family History  Problem Relation Age of Onset  . Breast cancer Neg Hx    Social History   Socioeconomic History  . Marital status: Single    Spouse name: Not on file  . Number of children: Not on file  . Years of education: Not on file  . Highest education level: Not on file  Occupational History  . Not on file  Tobacco Use  . Smoking status: Never Smoker  . Smokeless tobacco: Never Used  Vaping Use  . Vaping Use: Never used  Substance and Sexual Activity  . Alcohol use: Yes    Alcohol/week: 0.0 standard drinks    Comment: states once a wine/beer occassionally   . Drug use: No  . Sexual activity: Never  Other Topics Concern  . Not on file  Social History Narrative  . Not on file   Social Determinants of Health   Financial Resource Strain: Not on file  Food Insecurity: Not on file  Transportation Needs: Not on file  Physical Activity: Not on file  Stress: Not on file  Social Connections: Not on file   No past surgical history on file. Past Medical History:  Diagnosis Date  . Diverticulosis   . Hypertension   . Uterine prolapse    BP 124/85   Pulse 71   Temp 98.9 F (37.2 C)   Ht 5\' 4"  (1.626 m)   SpO2 98%   BMI 28.32 kg/m   Opioid Risk Score:   Fall Risk Score:  `1  Depression screen PHQ 2/9  Depression screen [facility] 2/9 08/25/2020 06/15/2020  Decreased Interest 0 1  Down, Depressed, Hopeless 0 1  PHQ - 2 Score 0 2  Altered sleeping - 0  Tired, decreased energy - 1  Change in appetite - 0  Feeling bad or failure about yourself  - 1  Trouble concentrating - 0   Moving slowly or fidgety/restless - 3  Suicidal thoughts - 0  PHQ-9 Score - 7  Difficult doing work/chores - Somewhat difficult   Review of Systems  Constitutional: Negative.   HENT: Negative.   Eyes: Negative.   Respiratory: Negative.   Cardiovascular: Negative.   Gastrointestinal: Negative.   Endocrine: Negative.   Genitourinary: Negative.   Musculoskeletal: Positive for gait problem.       Spasms  Skin: Negative.   Allergic/Immunologic: Negative.   Neurological: Positive for weakness.  Psychiatric/Behavioral: Positive for confusion and dysphoric mood.  All other systems reviewed and are negative.      Objective:   Gen: no distress, normal appearing HEENT: oral mucosa pink and moist, NCAT Cardio: Reg rate  Chest: normal effort, normal rate of breathing Abd: soft, non-distended Ext: no edema Psych: pleasant, normal affect Skin: intact Neuro: Arrived in wheelchair  Musculoskeletal: Normal Muscle Bulk and Muscle Testing Reveals:  Upper Extremities: Right Upper Extremity: 0/5 strength.  Left Lower Extremity: Full ROM and Muscle Strength 5/5  Lower Extremities: Right Lower Extremity: 4-/5 HF, KE, 0/5 DF and PF.  Left Lower Extremity: Full ROM and Muscle Strength 5/5 Right sided MAS 1 in elbow flexors, wrist flexors, and finger flexors Severe expressive >receptive aphasia Psych: Pt's affect is appropriate. Pt is cooperative    Assessment & Plan:  1. Acute Ischemic Left MCA Stroke:Right Hemiplegia: Continue outpatient OT, PT, and SLP. She has had a HFU appointment with Neurology. Discussed that I can provide new scripts next month if needed. Communicated with her providers regarding positive reinforcement. She has made excellent gains in strength as well as cognition and speech!  -Provided with new disability placard.   2. Essential Hypertension: BP is currently well controlled in office. Medications reviewed and she is taking Amlodipine 5mg  and lopressor. Check BP at home  and bring log to follow-up appointment. Daughter will also bring in BP cuff to follow-up appointment and we will check along with our machine to make sure her cuff is working well. Provided with a list of foods that will help lower her BP.  1) citrus foods 2) salmon and other fatty fish 3) swiss chard (leafy green) 4) pumpkin seeds 5) Beans and lentils 6) Berries 7) Amaranth (whole grain, can be cooked similarly to rice and oats) 8) Pistachios 9) Carrots 10) Celery 11) Tomatoes 12) Broccoli 13) Greek yogurt 14) Herbs and spices: Celery seed, cilantro, saffron, lemongrass, black cumin, ginseng, cinnamon, cardamom, sweet basil, and ginger 15) Chia and flax seeds 16) Beets 17) spinach  3. Prevention of pressure injury:  -recommended offloading every hour.  -Lying prone and supine in bed at times to offload her sacrum.   4. Appetite:  -improved.  -Encouraged eating nutritious foods and discussed recommendations.  -May stop Mirtazepine.  -Provided with turmeric to add to meals. -Turmeric to reduce inflammation--can be used in cooking or taken as a supplement.  Benefits of turmeric:  -Highly anti-inflammatory  -Increases antioxidants  -Improves memory, attention, brain disease  -Lowers risk of heart disease  -May help prevent cancer  -Decreases pain  -Alleviates depression  -Delays aging and decreases risk of chronic disease  -Consume with black pepper to increase absorption   Turmeric Milk Recipe:  1 cup milk  1 tsp turmeric  1 tsp cinnamon  1 tsp grated ginger (optional)  Black pepper (boosts the anti-inflammatory properties of turmeric).  1 tsp honey   5) Depression: -Improved, can stop Mirtazepine -Counseled regarding making time for the activities she loves every day.  6) Pain well controlled: may use Gabapentin as needed.   7) HLD: advised wild caught salmon once per week and walnuts every day. On 40mg  statin daily.   All questions were  encouraged and answered.  F/U in February.

## 2020-12-16 NOTE — Patient Instructions (Signed)
Access Code: [email or web address] URL: [email or web address] Date: 12/16/2020 Prepared by: Sherlie Ban  Exercises Supine Heel Slide - 1 x daily - 5 x weekly - 1 sets - 10 reps Supine Bridge - 2 x daily - 5 x weekly - 1 sets - 10 reps Seated Long Arc Quad - 2 x daily - 5 x weekly - 2 sets - 10 reps Supine Ankle Dorsiflexion Stretch with Caregiver - 2 x daily - 5 x weekly - 3 sets - 20 hold Hooklying Single Leg Bent Knee Fallouts with Resistance - 1 x daily - 7 x weekly - 2 sets - 5 reps Supine March - 1 x daily - 7 x weekly - 1 sets - 10 reps

## 2020-12-30 ENCOUNTER — Encounter: Payer: Self-pay | Admitting: Physical Therapy

## 2020-12-30 ENCOUNTER — Other Ambulatory Visit: Payer: Self-pay

## 2020-12-30 ENCOUNTER — Ambulatory Visit: Payer: Medicaid Other | Admitting: Speech Pathology

## 2020-12-30 ENCOUNTER — Encounter: Payer: Self-pay | Admitting: Speech Pathology

## 2020-12-30 ENCOUNTER — Ambulatory Visit: Payer: Medicaid Other | Admitting: Physical Therapy

## 2020-12-30 ENCOUNTER — Ambulatory Visit: Payer: Medicaid Other | Admitting: Occupational Therapy

## 2020-12-30 DIAGNOSIS — M6281 Muscle weakness (generalized): Secondary | ICD-10-CM

## 2020-12-30 DIAGNOSIS — R41840 Attention and concentration deficit: Secondary | ICD-10-CM

## 2020-12-30 DIAGNOSIS — M25631 Stiffness of right wrist, not elsewhere classified: Secondary | ICD-10-CM

## 2020-12-30 DIAGNOSIS — R262 Difficulty in walking, not elsewhere classified: Secondary | ICD-10-CM

## 2020-12-30 DIAGNOSIS — R278 Other lack of coordination: Secondary | ICD-10-CM

## 2020-12-30 DIAGNOSIS — R4701 Aphasia: Secondary | ICD-10-CM

## 2020-12-30 DIAGNOSIS — R2681 Unsteadiness on feet: Secondary | ICD-10-CM

## 2020-12-30 DIAGNOSIS — I69851 Hemiplegia and hemiparesis following other cerebrovascular disease affecting right dominant side: Secondary | ICD-10-CM

## 2020-12-30 DIAGNOSIS — R482 Apraxia: Secondary | ICD-10-CM

## 2020-12-30 DIAGNOSIS — R41844 Frontal lobe and executive function deficit: Secondary | ICD-10-CM

## 2020-12-30 NOTE — Therapy (Signed)
[facility] [address] [place], [place], [address] Phone: [phone]   Fax:  [phone]  Occupational Therapy Treatment & Recertification  Patient Details  Name: Lindsey [name] MRN: [id number] Date of Birth: [birth year] Referring Provider (OT): Leeroy Cha, MD   Encounter Date: 12/30/2020   OT End of Session - 12/30/20 1618    Visit Number 13    Number of Visits 14   1 more visit added for renewal   Date for OT Re-Evaluation 12/09/20    Authorization Type [employer] - Medicaid    Authorization Time Period VL: 27 PT/OT/ST combined - Week 4 of 4 in renewal (12/30/20)    Authorization - Visit Number 8   in 2022 of OT   Authorization - Number of Visits 9    OT Start Time 1617    OT Stop Time 1700    OT Time Calculation (min) 43 min    Activity Tolerance Patient tolerated treatment well    Behavior During Therapy [facility] for tasks assessed/performed           Past Medical History:  Diagnosis Date  . Diverticulosis   . Hypertension   . Uterine prolapse     No past surgical history on file.  There were no vitals filed for this visit.   Subjective Assessment - 12/31/20 0948    Subjective  pt denies any pain    Patient is accompanied by: Family member   daughter   Pertinent History PMH significant for HTN, EtOH abuse, diverticulosis, CVA    Limitations Fall Risk. Not Driving. Aphasia. Sensory deficits RUE. Apraxia.    Patient Stated Goals get arm working better (right)    Currently in Pain? No/denies                Pt engaged in neuroreeducation for RUE involving weight bearing into hand while reaching with RUE across midline with total A for maintaining hand position on edge of mat. Pt was instructed on weight bearing into forearm on table with daughter present and taking video for carryover at home. Weight bearing encouraged for more scapular stability and facilitating neuromuscular reed in RUE. Pt was  instructed on self PROM of hand as tone will come in waves and when botox wears off, patient is to continue doing stretching at home on RUE.   Standing Balance: standing balance addressed with standing with rolling walker and reaching down to knee level to obtain x8 clothespins from pants to simulate clothing management during toileting and dressing. Pt sit to stand with supervision and verbal cues for hand placement and assistance with walker splint and with SBA - min A for standing balance with reaching down. LOB x 1.            OT Short Term Goals - 12/31/20 0950      OT SHORT TERM GOAL #1   Title Pt and caregiver will be independent with HEP12/30/21    Baseline not issued yet    Time 4    Period Weeks    Status Achieved    Target Date 10/01/20      OT SHORT TERM GOAL #2   Title Pt and caregiver will verbalize understanding of sensory strategies for safety regarding RUE with sensory deficits.    Baseline not reviewed/issued    Time 4    Period Weeks    Status Achieved      OT SHORT TERM GOAL #3   Title Pt will complete table  top scanning task with supervision and 75% accuracy.    Baseline right inatention and/or field deficits noted at eval    Time 4    Period Weeks    Status Achieved      OT SHORT TERM GOAL #4   Title Pt will complete light snack or beverage prep with set up assistance of materials that are not in reach with supervision and adapted strategies or equipment PRN    Baseline total A    Time 4    Period Weeks    Status Deferred   pt and daughter report things are not within reach of patient from wheelchair level.     OT SHORT TERM GOAL #5   Title Pt will don and doff shoes with supervision for increasing independence with basic ADLs    Baseline max A    Time 4    Period Weeks    Status Not Met      OT SHORT TERM GOAL #6   Title Pt and caregiver will verbalize and demonstrate understanding of splints and/or orthoses PRN for right hemiplegia     Baseline none issued    Time 4    Period Weeks    Status Achieved             OT Long Term Goals - 12/31/20 0950      OT LONG TERM GOAL #1   Title Pt and caregiver will be independent with updated HEP 10/29/20    Baseline not issued    Time 8    Period Weeks    Status On-going   continuing to address until dischrge     OT LONG TERM GOAL #2   Title Pt will perform UB dressing with min A for increase in independence with ADLs    Baseline max A    Time 8    Period Weeks    Status On-going   continues to receive mod A     OT LONG TERM GOAL #3   Title Pt will perform table top scanning task with 100% accuracy    Baseline inattention to right    Time 8    Period Weeks    Status Achieved      OT LONG TERM GOAL #4   Title Pt will achieve PROM of RUE shoulder flexion of 75 degrees with pain no greater than 4/10    Baseline 60 degrees pain 5/10    Time 8    Period Weeks    Status On-going      OT LONG TERM GOAL #5   Title Pt will report assisting with at least one home management or cooking activity per day in order to increase independence and decrease caregiver burden    Baseline not currently assisting    Time 8    Period Weeks    Status On-going   pt reports helping fold some laundry                Plan - 12/30/20 1703    Clinical Impression Statement Renewal completed to account for 1 visit remaining for plan of care and visit limit. Pt to discharge next session.    OT Occupational Profile and History Problem Focused Assessment - Including review of records relating to presenting problem    Occupational performance deficits (Please refer to evaluation for details): ADL's;IADL's;Leisure    Body Structure / Function / Physical Skills ADL;Decreased knowledge of use of DME;Strength;Gait;GMC;Tone;Pain;Dexterity;Balance;UE functional use;Proprioception;ROM;IADL;Vision;Sensation;Coordination;FMC    Cognitive Skills  Attention;Energy/Drive;Perception;Understand;Sequencing;Problem Solve    Rehab Potential Good    Clinical Decision Making Limited treatment options, no task modification necessary    Comorbidities Affecting Occupational Performance: None    Modification or Assistance to Complete Evaluation  No modification of tasks or assist necessary to complete eval    OT Frequency 1x / week    OT Duration Other (comment)   1x/week for 1 more week   OT Treatment/Interventions Self-care/ADL training;Moist Heat;DME and/or AE instruction;Balance training;Aquatic Therapy;Therapeutic activities;Cognitive remediation/compensation;Therapeutic exercise;Visual/perceptual remediation/compensation;Passive range of motion;Neuromuscular education;Functional Mobility Training;Cryotherapy;Electrical Stimulation;Energy conservation;Manual Therapy;Patient/family education    Plan RUE NMR, standing balance for ADLs, discharge next session    OT Home Exercise Plan Self-PROM seated shoulder    Recommended Other Services Pt receiving ST and PT    Consulted and Agree with Plan of Care Patient;Family member/caregiver    Family Member Consulted daughter           Patient will benefit from skilled therapeutic intervention in order to improve the following deficits and impairments:   Body Structure / Function / Physical Skills: ADL,Decreased knowledge of use of DME,Strength,Gait,GMC,Tone,Pain,Dexterity,Balance,UE functional use,Proprioception,ROM,IADL,Vision,Sensation,Coordination,FMC Cognitive Skills: Attention,Energy/Drive,Perception,Understand,Sequencing,Problem Solve     Visit Diagnosis: Unsteadiness on feet - Plan: Ot plan of care cert/re-cert  Frontal lobe and executive function deficit - Plan: Ot plan of care cert/re-cert  Hemiplegia and hemiparesis following other cerebrovascular disease affecting right dominant side ([place]) - Plan: Ot plan of care cert/re-cert  Muscle weakness (generalized) - Plan: Ot plan of care  cert/re-cert  Other lack of coordination - Plan: Ot plan of care cert/re-cert  Stiffness of right wrist, not elsewhere classified - Plan: Ot plan of care cert/re-cert  Attention and concentration deficit - Plan: Ot plan of care cert/re-cert    Problem List Patient Active Problem List   Diagnosis Date Noted  . Pain due to onychomycosis of toenails of both feet 11/19/2020  . Anemia 05/31/2020  . Acute ischemic left MCA stroke ([place]) 04/30/2020  . Right hemiplegia ([place]) 04/30/2020  . CVA (cerebral vascular accident) ([place]) 04/21/2020  . Unspecified atrial fibrillation ([place]) 04/21/2020  . Essential hypertension 04/21/2020  . Alcohol abuse 04/21/2020    Zachery Conch MOT, OTR/L  12/31/2020, 9:52 AM  [facility] [address] [place], [place], [address] Phone: [phone]   Fax:  [phone]  Name: Lindsey [name] MRN: [id number] Date of Birth: 1956/08/12

## 2020-12-30 NOTE — Therapy (Addendum)
[facility] [address] [place], [place], [address] Phone: [phone]   Fax:  [phone]  Physical Therapy Treatment/Discharge Summary  Patient Details  Name: Lindsey [name] MRN: [id number] Date of Birth: [birth year] Referring Provider (PT): Izora Ribas, MD   Encounter Date: 12/30/2020   PT End of Session - 12/31/20 1501    Visit Number 18    Number of Visits 18    Date for PT Re-Evaluation --   POC for 10 weeks total   Authorization Type U[employer] Medicaid (VL: 27 between OT/PT/ST)    Authorization - Visit Number 9    Authorization - Number of Visits 9    PT Start Time 4401    PT Stop Time 1615    PT Time Calculation (min) 42 min    Equipment Utilized During Treatment Gait belt    Activity Tolerance Patient tolerated treatment well    Behavior During Therapy WFL for tasks assessed/performed           Past Medical History:  Diagnosis Date  . Diverticulosis   . Hypertension   . Uterine prolapse     History reviewed. No pertinent surgical history.  There were no vitals filed for this visit.   Subjective Assessment - 12/30/20 1537    Subjective Nothing new since she was last here. Walking is going good.    Patient is accompained by: --   Caregiver   Pertinent History HTN, Alcohol Abuse    Limitations Standing;Walking    Patient Stated Goals Be able to walk    Currently in Pain? No/denies                             [facility] Adult PT Treatment/Exercise - 12/30/20 1608      Transfers   Transfers Sit to Stand;Stand to Sit    Sit to Stand 4: Min guard    Sit to Stand Details Tactile cues for weight shifting;Tactile cues for placement;Verbal cues for sequencing;Verbal cues for technique;Verbal cues for safe use of DME/AE    Sit to Stand Details (indicate cue type and reason) needs cues for proper UE placement (pushing off mat vs. on RW) and for proper placement of RLE to incr weight  bearing. performed x8 reps throughout session and demonstrated to pt's daughter how she can assist with this at home    Stand to Sit 4: Min guard    Stand to Sit Details (indicate cue type and reason) Verbal cues for precautions/safety;Verbal cues for technique;Verbal cues for sequencing    Stand to Sit Details cues for slowed descent and to remove LUE from hand orthosis prior to sitting    Comments x7 reps performed from mat table      Ambulation/Gait   Ambulation/Gait Yes    Ambulation/Gait Assistance 4: Min guard;4: Min assist    Ambulation/Gait Assistance Details gait with RW, hand orthosis, and R AFO - pt needing little assistance today to place RUE in orthosis, min A provided from therapist for pelvic alignment due to decr anterior pelvic rotation on R, otherwise pt able to ambulate with CGA, needing min A towards end of bout of gait for incr foot clearance as pt got fatigued    Ambulation Distance (Feet) 230 Feet    Assistive device Rolling walker    Gait Pattern Step-through pattern;Decreased stance time - right;Decreased step length - left;Decreased dorsiflexion - right;Decreased weight shift to right  Ambulation Surface Level;Indoor             Access Code: [email or web address] URL: [email or web address] Date: 12/30/2020 Prepared by: Janann August  Finalized pt's HEP (see MedBridge for more details). Pt's daughter talking with speech therapist and front office staff at this time and not present for review, but did verbally review with handout at end of session with pt's daughter.   Exercises Supine Heel Slide - 1 x daily - 5 x weekly - 1 sets - 10 reps Supine Bridge - 2 x daily - 5 x weekly - 1 sets - 10 reps Seated Long Arc Quad - 2 x daily - 5 x weekly - 2 sets - 10 reps Supine Ankle Dorsiflexion Stretch with Caregiver - 2 x daily - 5 x weekly - 3 sets - 20 hold Hooklying Single Leg Bent Knee Fallouts with Resistance - 1 x daily - 5 x weekly - 2 sets - 5 reps Seated  Hip Adduction Isometrics with Ball - 1 x daily - 5 x weekly - 2 sets - 10 reps      PT Education - 12/31/20 1501    Education Details final HEP, discharge at this session due to limited Medicaid visits (9 each for PT, OT, ST)    Person(s) Educated Patient;Child(ren)    Methods Explanation;Handout;Verbal cues    Comprehension Verbalized understanding            PT Short Term Goals - 10/15/20 3220      PT SHORT TERM GOAL #1   Title Patient and caregiver will report completing ambulation 1-2x/daily within the home (All STGs Due at Visit 9)    Baseline pt reports ambulating at home but just 2-3x a week    Time 2    Period Weeks    Status Not Met      PT SHORT TERM GOAL #2   Title Patient will be able to ambulate with RW and Min A x 175 ft to demonstrate improved household mobility    Baseline 140 ft Min A RW, 95' with RW with min A    Time 2    Period Weeks    Status Not Met             PT Long Term Goals - 12/31/20 1504      PT LONG TERM GOAL #1   Title Patient will be independent with final HEP and walking program within the home with caregiver assistance(All LTGS Due at Visit 18)    Baseline finalized on 12/31/20    Time 18   th visit   Status Achieved             PHYSICAL THERAPY DISCHARGE SUMMARY  Visits from Start of Care: 18  Current functional level related to goals / functional outcomes: See note above.   Remaining deficits: Gait abnormalities, decreased strength in RLE, impaired tone, balance abnormalities, decr RLE ROM  Education / Equipment: HEP   Plan: Patient agrees to discharge.  Patient goals were partially met. Patient is being discharged due to meeting the stated rehab goals.  ?????          Plan - 12/31/20 1504    Clinical Impression Statement Due to Medicaid visit limit (27 visits in calendar year would be split equally with 9 each between PT,OT,ST), today will be pt's discharge visit. Ambulated 59' with RW, with pt needing mostly  CGA, but still did need min A when more fatigued for RLE foot clearance at end  of session. Pt's daughter does report they have been consistently walking at home. Finalized pt's HEP today with pt to perform with her daughter's assist. Also reviewed with daughter how to perform sit <> stands for incr weight bearing through RLE when standing and sitting. No additional questions at end of session. Will D/C at this time.    Personal Factors and Comorbidities Comorbidity 2    Comorbidities HTN, Alcohol Abuse, Diverticulosis    Examination-Activity Limitations Bed Mobility;Bathing;Sit;Transfers;Dressing;Stairs;Stand;Locomotion Level;Toileting    Examination-Participation Restrictions Occupation;Community Activity;Driving    Stability/Clinical Decision Making Evolving/Moderate complexity    Rehab Potential Good    PT Frequency 1x / week    PT Duration --   1 week - last authorized Medicaid visit   PT Treatment/Interventions ADLs/Self Care Home Management;Electrical Stimulation;DME Instruction;Gait training;Stair training;Therapeutic activities;Functional mobility training;Therapeutic exercise;Balance training;Neuromuscular re-education;Patient/family education;Orthotic Fit/Training;Wheelchair mobility training;Manual techniques;Passive range of motion    PT Next Visit Plan D/C    PT Home Exercise Plan Access Code: [email or web address]    Consulted and Agree with Plan of Care Patient;Family member/caregiver    Family Member Consulted Daughter           Patient will benefit from skilled therapeutic intervention in order to improve the following deficits and impairments:  Abnormal gait,Decreased balance,Decreased mobility,Decreased endurance,Difficulty walking,Impaired sensation,Pain,Impaired UE functional use,Decreased strength,Decreased safety awareness,Decreased knowledge of use of DME,Decreased activity tolerance,Decreased range of motion,Impaired tone,Improper body mechanics  Visit Diagnosis: Hemiplegia and  hemiparesis following other cerebrovascular disease affecting right dominant side ([place])  Difficulty in walking, not elsewhere classified  Unsteadiness on feet  Muscle weakness (generalized)     Problem List Patient Active Problem List   Diagnosis Date Noted  . Pain due to onychomycosis of toenails of both feet 11/19/2020  . Anemia 05/31/2020  . Acute ischemic left MCA stroke ([place]) 04/30/2020  . Right hemiplegia ([place]) 04/30/2020  . CVA (cerebral vascular accident) ([place]) 04/21/2020  . Unspecified atrial fibrillation ([place]) 04/21/2020  . Essential hypertension 04/21/2020  . Alcohol abuse 04/21/2020    Arliss Journey , PT, DPT  12/31/2020, 3:06 PM  [facility] [address] [place], [place], [address] Phone: [phone]   Fax:  [phone]  Name: Coila Wardell MRN: [id number] Date of Birth: 07-02-1956

## 2020-12-30 NOTE — Therapy (Signed)
[facility] [address] [place], [place], [address] Phone: [phone]   Fax:  [phone]  Speech Language Pathology Treatment  Patient Details  Name: Lindsey [name] MRN: [id number] Date of Birth: Mar 25, 1956 Referring Provider (SLP): Dr. Leeroy Cha   Encounter Date: 12/30/2020   End of Session - 12/30/20 1551    Visit Number 9    Number of Visits 25    Date for SLP Re-Evaluation 12/23/20    Authorization Type UHC medicaid    Authorization - Visit Number 8    Authorization - Number of Visits 9    SLP Start Time 2536    SLP Stop Time  1530    SLP Time Calculation (min) 44 min    Activity Tolerance Patient tolerated treatment well           Past Medical History:  Diagnosis Date  . Diverticulosis   . Hypertension   . Uterine prolapse     History reviewed. No pertinent surgical history.  There were no vitals filed for this visit.   Subjective Assessment - 12/30/20 1453    Subjective "                 ADULT SLP TREATMENT - 12/30/20 1540      General Information   Behavior/Cognition Alert;Cooperative;Pleasant mood      Treatment Provided   Treatment provided Cognitive-Linquistic      Cognitive-Linquistic Treatment   Treatment focused on Aphasia;Apraxia;Patient/family/caregiver education    Skilled Treatment Lindsey [name] attended the online aphasia conversation group with [facility] and is aware of other on line groups and resources. At this time, she is not interested in continuing aphasia group. We discussed if they return to in person meetings, however Lindsey [name] indicated her preference is not to attend. Targeted word finding today, telling me about her granddaughters birthday party. She requied usual questioning cues and written choices to verbally approximate 3 details about the party. Divergent naming with jewelry Lindsey [name] verbalized 10 items in this category with written, sentnece completion cues.  Perseveration on "necklace" required redirection to stop. Lindsey [name] endorses using written choices at home continues to be successful. Lindsey [name] is practicing on Talk Path app regularly. Lindsey [name] also benefitted from written word cues to improve auditory comprehension of questions. Lindsey [name] confirms she uses writing to A with aud comp at home as well. We discussed increasing socialization to practice communication outside of family. Lindsey [name] used to have friends from church before stroke and Covid. They are open to reaching out to a church friend to set up a lunch or visit.      Assessment / Recommendations / Plan   Plan Continue with current plan of care      Progression Toward Goals   Progression toward goals Progressing toward goals            SLP Education - 12/30/20 1548    Education Details continue talk path and multimodal communication; increase socialization for speech practice    Person(s) Educated Patient;Child(ren)    Methods Explanation;Demonstration;Verbal cues    Comprehension Verbalized understanding;Returned demonstration;Verbal cues required;Need further instruction            SLP Short Term Goals - 12/30/20 1549      SLP SHORT TERM GOAL #1   Title Family will use written choices of 2 or 3 to ID pt's wants/needs with occasional min A over 2 weeks outsdie of therapy    Baseline No written cues or choices being used  Time 1    Period Weeks    Status Achieved      SLP SHORT TERM GOAL #2   Title Pt will use multimodal communication/AAC to communicate preferences for meal/snack 3x over 2 weeks with occasional min to mod A from family    Baseline no alternative communication strategies in place    Time 1    Period Weeks    Status Achieved      SLP SHORT TERM GOAL #3   Title Pt will use multimodal/AAC to ID family members she wants to communicate with or about with occasional min to mod A from family.    Time 1    Period Weeks    Status Partially Met      SLP SHORT  TERM GOAL #4   Title Pt will name 3 items in simple personally relevant category using multimodal communication with occasional min A    Baseline no items in simple category    Time 1    Period Weeks    Status Achieved      SLP SHORT TERM GOAL #5   Title Pt will ID 3 paraphasias with usual min to mod A over 4 sessions    Baseline no awareness of paraphasias or perseverations    Time 3    Period Weeks    Status On-going            SLP Long Term Goals - 12/30/20 1550      SLP LONG TERM GOAL #1   Title Pt will use multimodal communication to answer 5 preferences or answer 5 questions accurately at thte word level with min A from caregivers /ST over 3 sessions    Baseline other than yes/no, pt is not indicating preferences or answering basic questions accurately    Time 5    Period Weeks    Status On-going      SLP LONG TERM GOAL #2   Title Pt will ID 5 paraphasias in a session with usual min A and self correct with frequent mod A in naming task or conversation over 2 sessions    Baseline pt unaware of paraphasias and perseverations    Time 5    Period Weeks    Status On-going      SLP LONG TERM GOAL #3   Title Caregivers will use written or photo cues at 2-3 word level to facilitate 3 turns in a conversation with pt and provide assistance in pt following a conversation topic with occasional min A over 2 sessions.    Baseline no multimodal supports in place for conversation success    Time 5    Period Weeks    Status On-going      SLP LONG TERM GOAL #4   Title Pt will name 5 items in personally relevant category using multmodal communciation with occasional min A    Baseline no items in simple category; 11/18/20    Time 5    Period Weeks    Status On-going      SLP LONG TERM GOAL #5   Title Pt will attend aphasia conversation group (pending available technology) and family will ID 3 online/community support for persons with aphasia and caregivers    Baseline no community  support identified    Time 6    Period Weeks    Status Achieved            Plan - 12/30/20 1549    Clinical Impression Statement 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 Training in written choices has improved communication at home as Chenita is able to accurately select her preferences. Zianne has improved verbalizations at word level with placement cues and sentnece completion. Mulitmodal communication has reduced frustration at home. As Beverley continues to improve verbalizations and use of multi modal communication, it is medically necessary for her safety, to communicate medical concerns/pain and to reduce caregiver burden. I recommend 12 more visits of skilled ST to maximize communication.    Speech Therapy Frequency 2x / week    Duration 12 weeks    Treatment/Interventions Compensatory strategies;Patient/family education;Functional tasks;Cueing hierarchy;Environmental controls;Cognitive reorganization;Multimodal communcation approach;Language facilitation;Compensatory techniques;Internal/external aids;SLP instruction and feedback;Other (comment)    Potential to Achieve Goals Good           Patient will benefit from skilled therapeutic intervention in order to improve the following deficits and impairments:   Aphasia  Verbal apraxia    Problem List Patient Active Problem List   Diagnosis Date Noted  . Pain due to onychomycosis of toenails of both feet 11/19/2020  . Anemia 05/31/2020  . Acute ischemic left MCA stroke ([place]) 04/30/2020  . Right hemiplegia ([place]) 04/30/2020  . CVA (cerebral vascular accident) ([place]) 04/21/2020  . Unspecified atrial fibrillation ([place]) 04/21/2020  . Essential hypertension 04/21/2020  . Alcohol abuse 04/21/2020    Winifred Bodiford, Annye Rusk MS, CCC-SLP 12/30/2020, 3:52 PM  [facility] [address] [place], [place], [address] Phone: [phone]   Fax:   [phone]   Name: Tyisha Cressy MRN: [id number] Date of Birth: September 29, 1956

## 2021-01-13 ENCOUNTER — Ambulatory Visit: Payer: Medicaid Other | Attending: Physical Medicine and Rehabilitation | Admitting: Speech Pathology

## 2021-01-13 ENCOUNTER — Ambulatory Visit: Payer: Medicaid Other | Admitting: Occupational Therapy

## 2021-01-13 ENCOUNTER — Encounter: Payer: Self-pay | Admitting: Speech Pathology

## 2021-01-13 ENCOUNTER — Ambulatory Visit: Payer: Medicaid Other | Admitting: Physical Therapy

## 2021-01-13 ENCOUNTER — Encounter: Payer: Self-pay | Admitting: Occupational Therapy

## 2021-01-13 ENCOUNTER — Other Ambulatory Visit: Payer: Self-pay

## 2021-01-13 ENCOUNTER — Other Ambulatory Visit: Payer: Self-pay | Admitting: Physical Medicine and Rehabilitation

## 2021-01-13 DIAGNOSIS — R41840 Attention and concentration deficit: Secondary | ICD-10-CM | POA: Diagnosis not present

## 2021-01-13 DIAGNOSIS — R278 Other lack of coordination: Secondary | ICD-10-CM | POA: Insufficient documentation

## 2021-01-13 DIAGNOSIS — R2681 Unsteadiness on feet: Secondary | ICD-10-CM

## 2021-01-13 DIAGNOSIS — M6281 Muscle weakness (generalized): Secondary | ICD-10-CM | POA: Insufficient documentation

## 2021-01-13 DIAGNOSIS — R4701 Aphasia: Secondary | ICD-10-CM | POA: Insufficient documentation

## 2021-01-13 DIAGNOSIS — I69851 Hemiplegia and hemiparesis following other cerebrovascular disease affecting right dominant side: Secondary | ICD-10-CM | POA: Diagnosis not present

## 2021-01-13 DIAGNOSIS — R41844 Frontal lobe and executive function deficit: Secondary | ICD-10-CM | POA: Insufficient documentation

## 2021-01-13 DIAGNOSIS — M25631 Stiffness of right wrist, not elsewhere classified: Secondary | ICD-10-CM | POA: Insufficient documentation

## 2021-01-13 DIAGNOSIS — R482 Apraxia: Secondary | ICD-10-CM | POA: Diagnosis not present

## 2021-01-13 DIAGNOSIS — I63512 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 middle cerebral artery: Secondary | ICD-10-CM

## 2021-01-13 NOTE — Therapy (Signed)
[facility] [address] [place], [place], [address] Phone: [phone]   Fax:  [phone]  Speech Language Pathology Treatment & Discharge Summary  Patient Details  Name: Lindsey [name] MRN: [id number] Date of Birth: [birth year] Referring Provider (SLP): Dr. Leeroy Cha   Encounter Date: 01/13/2021   End of Session - 01/13/21 1604    Visit Number 10    Number of Visits 25    Date for SLP Re-Evaluation 12/23/20    Authorization Type UHC medicaid    Authorization - Visit Number 9    Authorization - Number of Visits 9    SLP Start Time 5284    SLP Stop Time  1324    SLP Time Calculation (min) 45 min    Activity Tolerance Patient tolerated treatment well           Past Medical History:  Diagnosis Date  . Diverticulosis   . Hypertension   . Uterine prolapse     History reviewed. No pertinent surgical history.  There were no vitals filed for this visit.   Subjective Assessment - 01/13/21 1452    Subjective "Having an Easter egg hunt for [employer]"    Patient is accompained by: Family member   [name]   Currently in Pain? 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 She continues to do [employer] app. Targeted word finding and sentnece generation today, using [facility], hospital (VNeST). Also trained her daughter to do VNeST as home activiity. Provided her written instruction, VNeST template and list of transitive verbs. Jaycee required frequent max A (written choice of 2 or 3) due to verbal apraxia to generate 3  subject verb object sentences for each verb, as well as to generate complex senteces answering "wh" questions. With placement cues and modeling, Jamese approximated the sentences. She Id'd paraphasias and perseverations with occasional min A, however frequent mod A (repetition, placement cues, phrase completion) to correct paraphasias/perseveration. Lizza participated in covnersation 5 turns re: Ivor Costa plans and about her grand daughter, with written cues/choices. They have not reached out to [employer] friend to broaden Saralee's communciation partners, but will continue to work on this      Assessment / Recommendations / Plan   Plan Discharge SLP treatment due to (comment)      Progression Toward Goals   Progression toward goals Goals met, education completed, patient discharged from [facility] - 01/13/21 1558    Education Details [employer]est; expand communication partners outside of just [employer]) Educated Patient;Child(ren)    Methods Explanation;Demonstration;Verbal cues;Handout    Comprehension Verbalized understanding            SPEECH THERAPY DISCHARGE SUMMARY  Visits from Start of Care: 10  Current functional level related to goals / functional outcomes: See goals below   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  Patient  goals were met. Patient is being discharged due to meeting the stated rehab goals.  ?????        SLP Short Term Goals - 01/13/21 1603      SLP SHORT TERM GOAL #1   Title Family will use written choices of 2 or 3 to ID pt's wants/needs with occasional min A over 2 weeks outsdie of therapy    Baseline No written cues or choices being used    Time 1    Period Weeks    Status Achieved      SLP SHORT TERM GOAL #2   Title Pt  will use multimodal communication/AAC to communicate preferences for meal/snack 3x over 2 weeks with occasional min to mod A from family    Baseline no alternative communication strategies in place    Time 1    Period Weeks    Status Achieved      SLP SHORT TERM GOAL #3   Title Pt will use multimodal/AAC to ID family members she wants to communicate with or about with occasional min to mod A from family.    Time 1    Period Weeks    Status Partially Met      SLP SHORT TERM GOAL #4   Title Pt will name 3 items in simple personally relevant category using multimodal communication with occasional min A    Baseline no items in simple category    Time 1    Period Weeks    Status Achieved      SLP SHORT TERM GOAL #5   Title Pt will ID 3 paraphasias with usual min to mod A over 4 sessions    Baseline no awareness of paraphasias or perseverations    Time 3    Period Weeks    Status Achieved            SLP Long Term Goals - 01/13/21 1603      SLP LONG TERM GOAL #1   Title Pt will use multimodal communication to answer 5 preferences or answer 5 questions accurately at thte word level with min A from caregivers /ST over 3 sessions    Baseline other than yes/no, pt is not indicating preferences or answering basic questions accurately    Time 5    Period Weeks    Status Achieved      SLP LONG TERM GOAL #2   Title Pt will ID 5 paraphasias in a session with usual min A and self correct with frequent mod A in naming task or conversation over 2 sessions    Baseline pt unaware of paraphasias and perseverations    Time 5    Period Weeks    Status Achieved      SLP LONG TERM GOAL #3   Title Caregivers will use written or photo cues at 2-3 word level to facilitate 3 turns in a conversation with pt and provide assistance in pt following a conversation topic with occasional min A over 2 sessions.    Baseline no multimodal supports in place for conversation success    Time 5    Period Weeks     Status Achieved      SLP LONG TERM GOAL #4   Title Pt will name 5 items in personally relevant category using multmodal communciation with occasional min A    Baseline no items in simple category; 11/18/20    Time 5    Period Weeks    Status On-going  SLP LONG TERM GOAL #5   Title Pt will attend aphasia conversation group (pending available technology) and family will ID 3 online/community support for persons with aphasia and caregivers    Baseline no community support identified    Time 6    Period Weeks    Status Achieved            Plan - 01/13/21 1558    Clinical Impression Statement Joesphine has improved to moderate verbal apraxia and aphasia. Multimodal communication, including offering her written choices f:4-5 allows Francyne to participate in simple converation and communicate her preferences. Spontaneous speech has improved in length and type of responses. She continues to benefit from visual placement and verbal phonemic cues as well as cues to reduce perseveration. [name] (daughter) trained in multimodal communciation and in VNeST for language practice upon d/c. Goals met, education complete. D/C ST at this time, Pt and family aware they may return for future course of therapy as needed.    Duration 12 weeks    Treatment/Interventions Compensatory strategies;Patient/family education;Functional tasks;Cueing hierarchy;Environmental controls;Cognitive reorganization;Multimodal communcation approach;Language facilitation;Compensatory techniques;Internal/external aids;SLP instruction and feedback;Other (comment)    Potential to Achieve Goals Good    Potential Considerations Severity of impairments           Patient will benefit from skilled therapeutic intervention in order to improve the following deficits and impairments:   Aphasia  Verbal apraxia    Problem List Patient Active Problem List   Diagnosis Date Noted  . Pain due to onychomycosis of toenails of both  feet 11/19/2020  . Anemia 05/31/2020  . Acute ischemic left MCA stroke ([place]) 04/30/2020  . Right hemiplegia ([place]) 04/30/2020  . CVA (cerebral vascular accident) ([place]) 04/21/2020  . Unspecified atrial fibrillation ([place]) 04/21/2020  . Essential hypertension 04/21/2020  . Alcohol abuse 04/21/2020    Charlee Whitebread, Annye Rusk MS, CCC-SLP 01/13/2021, 4:05 PM  [facility] [address] [place], [place], [address] Phone: [phone]   Fax:  [phone]   Name: Tyiesha Brackney MRN: [id number] Date of Birth: 1956/06/27

## 2021-01-13 NOTE — Therapy (Signed)
[facility] [address] [place], [place], [address] Phone: [phone]   Fax:  [phone]  Occupational Therapy Treatment & Discharge  Patient Details  Name: Lindsey [name] MRN: [id number] Date of Birth: [birth year] Referring Provider (OT): Leeroy Cha, MD   Encounter Date: 01/13/2021   OT End of Session - 01/13/21 1533    Visit Number 14    Number of Visits 14   1 more visit added for renewal   Date for OT Re-Evaluation 12/09/20    Authorization Type UHC - Medicaid    Authorization Time Period VL: 27 PT/OT/ST combined - Week 4 of 4 in renewal (12/30/20)    Authorization - Visit Number 9   in 2022 of OT   Authorization - Number of Visits 9    OT Start Time 1533    OT Stop Time 1609   d/c session   OT Time Calculation (min) 36 min    Activity Tolerance Patient tolerated treatment well    Behavior During Therapy [facility] for tasks assessed/performed           Past Medical History:  Diagnosis Date  . Diverticulosis   . Hypertension   . Uterine prolapse     History reviewed. No pertinent surgical history.  There were no vitals filed for this visit.   Subjective Assessment - 01/13/21 1534    Subjective  pt denies any pain    Patient is accompanied by: Family member   daughter   Pertinent History PMH significant for HTN, EtOH abuse, diverticulosis, CVA    Limitations Fall Risk. Not Driving. Aphasia. Sensory deficits RUE. Apraxia.    Patient Stated Goals get arm working better (right)    Currently in Pain? No/denies           OCCUPATIONAL THERAPY DISCHARGE SUMMARY  Visits from Start of Care: 14  Current functional level related to goals / functional outcomes: Pt has met 4/6 STGs and 5/5 LTGs and is discharging at this time. Pt made gains with positioning, increasing independence with ADLs, splints for positioning and protection of RUE and increase in PROM and decrease in pain in RUE.    Remaining  deficits: Pt remains densely hemiplegic in RUE   Education / Equipment: HEPs, education, handouts, positioning education  Plan: Patient agrees to discharge.  Patient goals were partially met. Patient is being discharged due to meeting the stated rehab goals.  ?????             Education provided regarding fall recovery and arm care for RUE after stroke.   Splint care for discharge. - replaced straps            OT Short Term Goals - 12/31/20 0950      OT SHORT TERM GOAL #1   Title Pt and caregiver will be independent with HEP12/30/21    Baseline not issued yet    Time 4    Period Weeks    Status Achieved    Target Date 10/01/20      OT SHORT TERM GOAL #2   Title Pt and caregiver will verbalize understanding of sensory strategies for safety regarding RUE with sensory deficits.    Baseline not reviewed/issued    Time 4    Period Weeks    Status Achieved      OT SHORT TERM GOAL #3   Title Pt will complete table top scanning task with supervision and 75% accuracy.    Baseline right inatention and/or field  deficits noted at eval    Time 4    Period Weeks    Status Achieved      OT SHORT TERM GOAL #4   Title Pt will complete light snack or beverage prep with set up assistance of materials that are not in reach with supervision and adapted strategies or equipment PRN    Baseline total A    Time 4    Period Weeks    Status Deferred   pt and daughter report things are not within reach of patient from wheelchair level.     OT SHORT TERM GOAL #5   Title Pt will don and doff shoes with supervision for increasing independence with basic ADLs    Baseline max A    Time 4    Period Weeks    Status Not Met      OT SHORT TERM GOAL #6   Title Pt and caregiver will verbalize and demonstrate understanding of splints and/or orthoses PRN for right hemiplegia    Baseline none issued    Time 4    Period Weeks    Status Achieved             OT Long Term Goals -  01/13/21 1535      OT LONG TERM GOAL #1   Title Pt and caregiver will be independent with updated HEP 10/29/20    Baseline not issued    Time 8    Period Weeks    Status Achieved   pt is completing HEP at home     OT LONG TERM GOAL #2   Title Pt will perform UB dressing with min A for increase in independence with ADLs    Baseline max A    Time 8    Period Weeks    Status Achieved   simulated in clinic with min A - reports needing mod A at home     OT LONG TERM GOAL #3   Title Pt will perform table top scanning task with 100% accuracy    Baseline inattention to right    Time 8    Period Weeks    Status Achieved      OT LONG TERM GOAL #4   Title Pt will achieve PROM of RUE shoulder flexion of 75 degrees with pain no greater than 4/10    Baseline 60 degrees pain 5/10    Time 8    Period Weeks    Status Achieved   90* no pain 01/13/21     OT LONG TERM GOAL #5   Title Pt will report assisting with at least one home management or cooking activity per day in order to increase independence and decrease caregiver burden    Baseline not currently assisting    Time 8    Period Weeks    Status Achieved   pt reports helping to do laundry, vacuum, put dishes/silverware away, mop                Plan - 01/13/21 1603    Clinical Impression Statement discharge completed this day.    OT Occupational Profile and History Problem Focused Assessment - Including review of records relating to presenting problem    Occupational performance deficits (Please refer to evaluation for details): ADL's;IADL's;Leisure    Body Structure / Function / Physical Skills ADL;Decreased knowledge of use of DME;Strength;Gait;GMC;Tone;Pain;Dexterity;Balance;UE functional use;Proprioception;ROM;IADL;Vision;Sensation;Coordination;FMC    Cognitive Skills Attention;Energy/Drive;Perception;Understand;Sequencing;Problem Solve    Rehab Potential Good    Clinical Decision  Making Limited treatment options, no task  modification necessary    Comorbidities Affecting Occupational Performance: None    Modification or Assistance to Complete Evaluation  No modification of tasks or assist necessary to complete eval    OT Frequency 1x / week    OT Duration Other (comment)   1x/week for 1 more week   OT Treatment/Interventions Self-care/ADL training;Moist Heat;DME and/or AE instruction;Balance training;Aquatic Therapy;Therapeutic activities;Cognitive remediation/compensation;Therapeutic exercise;Visual/perceptual remediation/compensation;Passive range of motion;Neuromuscular education;Functional Mobility Training;Cryotherapy;Electrical Stimulation;Energy conservation;Manual Therapy;Patient/family education    Plan RUE NMR, standing balance for ADLs, discharge next session    OT Home Exercise Plan Self-PROM seated shoulder    Recommended Other Services Pt receiving ST and PT    Consulted and Agree with Plan of Care Patient;Family member/caregiver    Family Member Consulted daughter           Patient will benefit from skilled therapeutic intervention in order to improve the following deficits and impairments:   Body Structure / Function / Physical Skills: ADL,Decreased knowledge of use of DME,Strength,Gait,GMC,Tone,Pain,Dexterity,Balance,UE functional use,Proprioception,ROM,IADL,Vision,Sensation,Coordination,FMC Cognitive Skills: Attention,Energy/Drive,Perception,Understand,Sequencing,Problem Solve     Visit Diagnosis: Frontal lobe and executive function deficit  Attention and concentration deficit  Unsteadiness on feet  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)  Muscle weakness (generalized)  Other lack of coordination  Stiffness of right wrist, not elsewhere classified    Problem List Patient Active Problem List   Diagnosis Date Noted  . Pain due to onychomycosis of toenails of both feet 11/19/2020  . Anemia 05/31/2020  . Acute  ischemic left MCA stroke ([place]) 04/30/2020  . Right hemiplegia ([place]) 04/30/2020  . CVA (cerebral vascular accident) ([place]) 04/21/2020  . Unspecified atrial fibrillation ([place]) 04/21/2020  . Essential hypertension 04/21/2020  . Alcohol abuse 04/21/2020    Zachery Conch MOT, OTR/L  01/13/2021, 5:27 PM  [facility] [address] [place], [place], [address] Phone: [phone]   Fax:  [phone]  Name: Lindsey [name] MRN: [id number] Date of Birth: Jul 28, 1956

## 2021-01-13 NOTE — Patient Instructions (Addendum)
Discussed Fall Recovery + Arm Care after Stroke

## 2021-02-18 ENCOUNTER — Ambulatory Visit: Payer: Medicaid Other | Admitting: Podiatry

## 2021-02-25 ENCOUNTER — Other Ambulatory Visit: Payer: Self-pay | Admitting: Physical Medicine and Rehabilitation

## 2021-02-25 ENCOUNTER — Telehealth: Payer: Self-pay | Admitting: Physical Medicine and Rehabilitation

## 2021-02-25 DIAGNOSIS — R4701 Aphasia: Secondary | ICD-10-CM

## 2021-02-25 DIAGNOSIS — G8191 Hemiplegia, unspecified affecting right dominant side: Secondary | ICD-10-CM

## 2021-02-25 DIAGNOSIS — I63512 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 middle cerebral artery: Secondary | ICD-10-CM

## 2021-02-25 NOTE — Telephone Encounter (Signed)
Sent!

## 2021-02-25 NOTE — Telephone Encounter (Signed)
Family Nakita called office - patient had a change in insurance and is requesting that patient complete more pt/ot/slp--- we will upload new insurance card once received in email - can you review and advise it's okay -- we will ask for prior auth  And send referral if y ou agree

## 2021-03-03 ENCOUNTER — Encounter: Payer: Self-pay | Admitting: Adult Health

## 2021-03-03 ENCOUNTER — Ambulatory Visit: Payer: Medicaid Other | Admitting: Adult Health

## 2021-03-03 VITALS — BP 125/67 | HR 81 | Ht 64.0 in

## 2021-03-03 DIAGNOSIS — Z8673 Personal history of transient ischemic attack (TIA), and cerebral infarction without residual deficits: Secondary | ICD-10-CM | POA: Diagnosis not present

## 2021-03-03 DIAGNOSIS — R4701 Aphasia: Secondary | ICD-10-CM

## 2021-03-03 DIAGNOSIS — I4819 Other persistent atrial fibrillation: Secondary | ICD-10-CM

## 2021-03-03 DIAGNOSIS — G8111 Spastic hemiplegia affecting right dominant side: Secondary | ICD-10-CM

## 2021-03-03 DIAGNOSIS — I10 Essential (primary) hypertension: Secondary | ICD-10-CM

## 2021-03-03 DIAGNOSIS — E785 Hyperlipidemia, unspecified: Secondary | ICD-10-CM | POA: Diagnosis not present

## 2021-03-03 NOTE — Progress Notes (Signed)
[facility] [address] [place]. [place] [id number] [phone]       STROKE FOLLOW UP NOTE  [name] Date of Birth:  07/13/1956 Medical Record Number:  [id number]   Reason for Referral:  stroke follow up    SUBJECTIVE:   CHIEF COMPLAINT:  Chief Complaint  Patient presents with  . Follow-up    TR with grandson (Lindsey [name]) Pt has R leg/hand weakness and complications, seems to have [name] difficulty but pt says she doesn't. Still doing therapy.     HPI:   Today, 03/03/2021, Lindsey [name] returns for 8844-month stroke follow-up accompanied by her grandson, Lindsey [name].   Doing well from stroke standpoint without new stroke/TIA symptoms.  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  Stable since prior visit without new or worsening stroke/TIA symptoms and reports residual right spastic hemiplegia and aphasia.  Continues to work with neuro rehab PT/OT/SLP with progress notes personally reviewed and has been slowly making improvements.  She ambulates now short distances with rolling walker and AFO brace.  OT reached out in regards to possible benefit of muscle relaxant for RUE spasticity and tone interfering with overall mobility. Per daughter, she questions if there is an anxiety component in regards to RUE spasticity as this worsens prior to therapy sessions. She is also routinely followed by PMR with follow-up  visit scheduled 2/11.  She has remained on Eliquis and atorvastatin for secondary stroke prevention of side effects.  She has not had recent lab work.  Blood pressure today 113/78.  No further concerns at this time.  Initial visit 07/08/2020 JM: Lindsey [name] is being seen for hospital follow-up accompanied by her daughter who provides majority of history.  She was discharged home from CIR on 05/29/2020.  Residual deficits of right hemiplegia and expressive > receptive aphasia but has experienced ongoing improvement.  No residual dysphagia.  Able to stand with assistance but unable to ambulate at this time.  She did receive HH therapy for 2 weeks but placed on hold as she was awaiting for Medicaid approval.  Recently approved by Medicaid and daughter requesting possibility of restarting therapy sessions.  Denies new or worsening stroke/TIA symptoms. CT head repeated 8/9 during CIR admission and cleared to initiate Eliquis on 8/11.  She remains on Eliquis without bleeding or bruising.  Remains on atorvastatin 40 mg daily without myalgias.  Blood pressure today 136/88. Monitored at home by therapy which was stable.  No further concerns at this time.  Stroke admission 04/21/2020 [name] is a 65 y.o. female with history of HTN who presented on 04/21/2020 with slurred [name] and right-sided weakness.  Stroke work-up revealed left MCA infarct with hemorrhagic transformation, embolic most likely secondary to new onset A. fib.  CTA showed left M2 occlusion but unfortunately not surgical candidate due to late presentation.  No antithrombotic PTA and initiated aspirin with recommendation of initiating anticoagulation after 3 to 5 days due to hemorrhagic transformation for new onset A. Fib. HTN stable and  resumed home meds amlodipine and atenolol.  LDL 127 initiate atorvastatin 40 mg daily.  Pre-DM with A1c 6.1.  Other stroke risk factors include former tobacco use, EtOH use, and substance use with positive UDS for THC.   Residual deficits of dysphagia, global aphasia and right hemiplegia.  Evaluated therapy and recommended discharge to CIR for ongoing therapy needs.  Discharged to CIR on 04/30/2020.  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 Mild L lateral ventricle mass effect w/ 75mm midline shift. ASPECTS 4    CTA head & neck L MCA infarct. L M2 occlusion.   MRI  L MCA infarct w/ mild hemorrhagic transformation   Repeat CT head evolving L MCA infarct w/ stable hemorrhage   2D Echo normal ejection fraction.  No clot.  LE doppler no DVT.    LDL 127  HgbA1c 6.1  VTE prophylaxis - added Lovenox 40 mg sq daily (ok in setting of hemorrhagic transformation)  No antithrombotic prior to admission, now on No antithrombotic given hemorrhagic transformation. Ok for aspirin (not ok for [facility]). Will add.     Therapy recommendations:  CIR  Disposition:  CIR      ROS:   Unable to obtain due to aphasia  PMH:  Past Medical History:  Diagnosis Date  . Diverticulosis   . Hypertension   . Uterine prolapse     PSH: History reviewed. No pertinent surgical history.  Social History:  Social History   Socioeconomic History  . Marital status: Single    Spouse name: Not on file  . Number of children: Not on file  . Years of education: Not on file  . Highest education level: Not on file  Occupational History  . Not on file  Tobacco Use  . Smoking status: Never Smoker  . Smokeless tobacco: Never Used  Vaping Use  . Vaping Use: Never used  Substance and Sexual Activity  . Alcohol use: Yes    Alcohol/week: 0.0 standard drinks    Comment: states once a wine/beer occassionally   . Drug use: No  . Sexual activity: Never  Other Topics Concern  . Not on file  Social History Narrative  . Not on file   Social Determinants of Health   Financial Resource Strain: Not on file  Food  Insecurity: Not on file  Transportation Needs: Not on file  Physical Activity: Not on file  Stress: Not on file  Social Connections: Not on file  Intimate Partner Violence: Not on file    Family History:  Family History  Problem Relation Age of Onset  . Breast cancer Neg Hx     Medications:   Current Outpatient Medications on File Prior to Visit  Medication Sig Dispense Refill  . acetaminophen (TYLENOL) 325 MG tablet Take 1-2 tablets (325-650 mg total) by mouth every 4 (four) hours as needed for mild pain.    Marland Kitchen amLODipine (NORVASC) 5 MG tablet Take 1 tablet (5 mg total) by mouth daily. 30 tablet 3  . apixaban (ELIQUIS) 5 MG TABS tablet Take 1 tablet (5 mg total) by mouth 2 (two) times daily. 60 tablet 0  . atorvastatin (LIPITOR) 20 MG tablet Take 1 tablet (20 mg total) by mouth daily at 6 PM. 90 tablet 3  . diclofenac Sodium (VOLTAREN) 1 % GEL Apply 2 g topically 3 (three) times daily. 350 g 0  . gabapentin (NEURONTIN)  100 MG capsule Take 1 capsule (100 mg total) by mouth 2 (two) times daily. 60 capsule 0  . metoprolol tartrate (LOPRESSOR) 50 MG tablet Take 1 tablet (50 mg total) by mouth 2 (two) times daily. 60 tablet 0  . mirtazapine (REMERON SOL-TAB) 15 MG disintegrating tablet Take 1 tablet (15 mg total) by mouth at bedtime. 30 tablet 0  . Mouthwashes (MOUTH RINSE) LIQD solution 15 mLs by Mouth Rinse route 2 times daily at 12 noon and 4 pm. 473 mL 0   No current facility-administered medications on file prior to visit.    Allergies:  No Known Allergies    OBJECTIVE:  Physical Exam  Vitals:   03/03/21 0917  BP: 125/67  Pulse: 81  Height: 5\' 4"  (1.626 m)   Body mass index is 28.32 kg/m. No exam data present  General: well developed, well nourished,  pleasant middle-age African-American female, seated, in no evident distress Head: head normocephalic and atraumatic.   Neck: supple with no carotid or supraclavicular bruits Cardiovascular: irregular rate and rhythm,  no murmurs Musculoskeletal: no deformity Skin:  no rash/petichiae Vascular:  Normal pulses all extremities   Neurologic Exam Mental Status: Awake and fully alert. Moderate to severe expressive> mild receptive aphasia.  Follows simple step commands without difficulty.  Mild to moderate difficulty naming objects.  Difficulty fully assessing orientation due to aphasia but oriented to time and place with options.  Mood and affect appropriate but easily became tearful during exam and frustrations with continued [name] difficulty.  Cranial Nerves: Pupils equal, briskly reactive to light. Extraocular movements full without nystagmus. Visual fields full to confrontation. Hearing intact. Facial sensation intact.  Right lower facial weakness.  Tongue, palate moves normally and symmetrically.  Motor: Full strength left upper and lower extremity.  RUE: 1/5 distally with increased tone throughout (some hand movement when asked to squeeze and some arm movement in with elbow extension).  RLE: 4+/5 hip flexor, knee extension and flexion and 0/5 ankle dorsilfexion and planter flexion with use of AFO brace Sensory.: intact to touch , pinprick , position and vibratory sensation.  Coordination: Rapid alternating movements normal in all extremities on left side. Finger-to-nose and heel-to-shin performed accurately on left side. Gait and Station: Deferred as RW not present Reflexes: 2+ RUE and RLE: 1+ LUE and LLE. Toes downgoing.       ASSESSMENT: Lindsey [name] on 04/21/2020 found to have dysarthria, aphasia, left gaze preference and right hemiparesis and noted to be in A. fib with RVR with stroke work-up revealing left MCA infarct with hemorrhagic transformation secondary to new onset A. fib. Vascular risk factors include new onset A. fib, HTN, HLD, pre-DM, history of tobacco use and THC use.      PLAN:  1. Left MCA stroke:   a. Residual deficit: Right spastic hemiparesis, aphasia and right facial weakness -improvement noted since prior visit.  Continue working with neuro rehab PT/OT for hopeful further recovery.  Continue to follow-up with PMR for Botox injections.  Encouraged continued SLP HEP at home. Needs a lot of encouragement and reassurance b. Continue Eliquis (apixaban) daily  and atorvastatin for secondary stroke prevention.  c. Discussed secondary stroke prevention measures and importance of close PCP follow up for aggressive stroke risk factor management  2. Persistent atrial fibrillation:  a. On Eliquis for CHA2DS2-VASc score 5 indicating AC for stroke prevention routinely monitored by cardiology 3. HTN: BP goal <130/90.  Well-controlled on amlodipine and metoprolol per PCP 4. HLD: LDL goal <70.  On atorvastatin 40 mg daily with prior LDL 32    Follow up in 6 months or call earlier if needed   CC:  [facility] provider: Dr. Epimenio Foot, Sylvie Farrier, MD    I spent 33 minutes of face-to-face and non-face-to-face time with patient and grandson.  This included previsit chart review, lab review, electronic health record documentation, and extensive patient and grandson discussion and education regarding prior stroke and continued residual deficits with continued participation with therapies, secondary stroke prevention measures and importance of managing stroke risk factors and answered all other questions to patient and grandsons satisfaction   Ihor Austin, AGNP-BC  [facility] [address] [place], [place] [address]  Phone [phone] Fax [phone] Note: This document was prepared with digital dictation and possible smart phrase technology. Any transcriptional errors that result from this process are unintentional.

## 2021-03-03 NOTE — Patient Instructions (Addendum)
Continue doing therapies as you continue to make progress - keep up the excellent work!!! Look up [employer] apps which have multiple different choices that can further help with speech difficulties    Continue Eliquis (apixaban) daily  and atorvastatin for secondary stroke prevention  Continue to follow up with PCP regarding cholesterol and blood pressure management  Maintain strict control of hypertension with blood pressure goal below 130/90 and cholesterol with LDL cholesterol (bad cholesterol) goal below 70 mg/dL.       Followup in the future with me in 6 months or call earlier if needed       Thank you for coming to see [place] at [facility]. I hope we have been able to provide you high quality care today.  You may receive a patient satisfaction survey over the next few weeks. We would appreciate your feedback and comments so that we may continue to improve ourselves and the health of our patients.

## 2021-03-08 NOTE — Progress Notes (Signed)
I agree with the above plan 

## 2021-03-18 ENCOUNTER — Encounter: Payer: Self-pay | Admitting: Physical Medicine and Rehabilitation

## 2021-03-18 ENCOUNTER — Encounter: Payer: Medicare Other | Attending: Registered Nurse | Admitting: Physical Medicine and Rehabilitation

## 2021-03-18 ENCOUNTER — Other Ambulatory Visit: Payer: Self-pay

## 2021-03-18 DIAGNOSIS — G8191 Hemiplegia, unspecified affecting right dominant side: Secondary | ICD-10-CM | POA: Diagnosis not present

## 2021-03-18 DIAGNOSIS — I63512 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 middle cerebral artery: Secondary | ICD-10-CM | POA: Insufficient documentation

## 2021-03-18 DIAGNOSIS — I69398 Other sequelae of cerebral infarction: Secondary | ICD-10-CM | POA: Diagnosis not present

## 2021-03-18 DIAGNOSIS — G8111 Spastic hemiplegia affecting right dominant side: Secondary | ICD-10-CM | POA: Diagnosis not present

## 2021-03-18 DIAGNOSIS — R252 Cramp and spasm: Secondary | ICD-10-CM | POA: Diagnosis not present

## 2021-03-18 NOTE — Progress Notes (Signed)
Botox Injection for spasticity, palpation and activation approach used for localization since ultrasound machine was not working   Dilution: 1:1 Indication: Severe spasticity which interferes with ADL,mobility and/or  hygiene and is unresponsive to medication management and other conservative care   Informed consent was obtained after describing risks and benefits of the procedure with the patient. This includes bleeding, bruising, infection, excessive weakness, or medication side effects. A REMS form is on file and signed.  1) Biceps Brachii: 50U divided in 2 sites 2) Flexor digitorum profundus: 20U in 1 site 3) Flexor carpi radialis: 10 U in 1 site 4) Flexor digitorum superficialis: 20U in 1 site     All injections were done after palpation and patient activation of the muscle confirmed the accurate location, and after negative drawback for blood. The patient tolerated the procedure well. Post procedure instructions were given. A followup appointment was made.

## 2021-03-22 ENCOUNTER — Ambulatory Visit: Payer: Medicare Other | Admitting: Speech Pathology

## 2021-03-22 ENCOUNTER — Ambulatory Visit: Payer: Medicare Other | Admitting: Occupational Therapy

## 2021-03-22 ENCOUNTER — Ambulatory Visit: Payer: Medicare Other | Admitting: Physical Therapy

## 2021-03-24 ENCOUNTER — Ambulatory Visit: Payer: Medicaid Other | Admitting: Physical Medicine and Rehabilitation

## 2021-04-01 ENCOUNTER — Ambulatory Visit: Payer: Medicare Other

## 2021-04-01 ENCOUNTER — Encounter: Payer: Self-pay | Admitting: Occupational Therapy

## 2021-04-01 ENCOUNTER — Other Ambulatory Visit: Payer: Self-pay

## 2021-04-01 ENCOUNTER — Ambulatory Visit: Payer: Medicare Other | Attending: Physical Medicine and Rehabilitation

## 2021-04-01 ENCOUNTER — Ambulatory Visit: Payer: Medicare Other | Admitting: Occupational Therapy

## 2021-04-01 DIAGNOSIS — R41842 Visuospatial deficit: Secondary | ICD-10-CM | POA: Insufficient documentation

## 2021-04-01 DIAGNOSIS — M6281 Muscle weakness (generalized): Secondary | ICD-10-CM

## 2021-04-01 DIAGNOSIS — R2681 Unsteadiness on feet: Secondary | ICD-10-CM

## 2021-04-01 DIAGNOSIS — R482 Apraxia: Secondary | ICD-10-CM | POA: Diagnosis present

## 2021-04-01 DIAGNOSIS — R2689 Other abnormalities of gait and mobility: Secondary | ICD-10-CM | POA: Insufficient documentation

## 2021-04-01 DIAGNOSIS — I69851 Hemiplegia and hemiparesis following other cerebrovascular disease affecting right dominant side: Secondary | ICD-10-CM | POA: Insufficient documentation

## 2021-04-01 DIAGNOSIS — R4701 Aphasia: Secondary | ICD-10-CM | POA: Diagnosis present

## 2021-04-01 DIAGNOSIS — R41840 Attention and concentration deficit: Secondary | ICD-10-CM | POA: Insufficient documentation

## 2021-04-01 DIAGNOSIS — R278 Other lack of coordination: Secondary | ICD-10-CM | POA: Diagnosis present

## 2021-04-01 DIAGNOSIS — R41844 Frontal lobe and executive function deficit: Secondary | ICD-10-CM

## 2021-04-01 DIAGNOSIS — M25611 Stiffness of right shoulder, not elsewhere classified: Secondary | ICD-10-CM | POA: Insufficient documentation

## 2021-04-01 NOTE — Therapy (Signed)
[facility] [address] [place], [place], [address] Phone: [phone]   Fax:  [phone]  Speech Language Pathology Evaluation  Patient Details  Name: Lindsey [name] MRN: [id number] Date of Birth: [birth year] Referring Provider (SLP): Carlis Abbott Drema Pry, MD   Encounter Date: 04/01/2021   End of Session - 04/01/21 1603     Visit Number 1    Number of Visits 25    Date for SLP Re-Evaluation 06/24/21    Authorization Type Medicare    SLP Start Time 1615    SLP Stop Time  1700    SLP Time Calculation (min) 45 min    Activity Tolerance Patient tolerated treatment well             Past Medical History:  Diagnosis Date   Diverticulosis    Hypertension    Uterine prolapse     No past surgical history on file.  There were no vitals filed for this visit.       SLP Evaluation [facility] - 04/01/21 1542       SLP Visit Information   SLP Received On 02/25/21    Referring Provider (SLP) Horton Chin, MD    Onset Date 04-21-20    Medical Diagnosis L MCA CVA   aphasia     Subjective   Patient/Family Stated Goal to improve communication      Pain Assessment   Currently in Pain? No/denies      General Information   HPI [name] is a 65 y.o. female with history of HTN who presented on 04/21/2020 with slurred speech and right-sided weakness.  Stroke work-up revealed left MCA infarct with hemorrhagic transformation, embolic most likely secondary to new onset A. fib.  CTA showed left M2 occlusion but unfortunately not surgical candidate due to late presentation.  No antithrombotic PTA and initiated aspirin with recommendation of initiating anticoagulation after 3 to 5 days due to hemorrhagic transformation for new onset A. Fib. HTN stable and resumed home meds amlodipine and atenolol.  She was hospitalized 04/21/20 to 05/28/20 including CIR    Mobility Status wheelchair - PT/OT eval today      Balance  Screen   Has the patient fallen in the past 6 months No    Has the patient had a decrease in activity level because of a fear of falling?  No    Is the patient reluctant to leave their home because of a fear of falling?  No      Prior Functional Status   Cognitive/Linguistic Baseline Within functional limits    Type of Home House     Lives With Daughter    Available Support Family    Education 12th    Vocation Other (Comment)   food service     Cognition   Overall Cognitive Status Difficult to assess      Auditory Comprehension   Overall Auditory Comprehension Impaired    Yes/No Questions Impaired    Basic Immediate Environment Questions 75-100% accurate    Complex Questions 50-74% accurate    Conversation Simple    Interfering Components Motor planning;Processing speed    EffectiveTechniques Extra processing time;Repetition;Pausing;Slowed speech;Stressing words;Visual/Gestural cues      Expression   Primary Mode of Expression Verbal      Verbal Expression   Overall Verbal Expression Impaired    Initiation No impairment    Level of Generative/Spontaneous Verbalization Word    Repetition Impaired    Level of Impairment  Sentence level    Naming Impairment    Responsive 26-50% accurate    Confrontation 25-49% accurate    Verbal Errors Semantic paraphasias;Phonemic paraphasias;Neologisms;Perseveration;Inconsistent    Pragmatics No impairment    Effective Techniques Open ended questions;Semantic cues;Phonemic cues;Written cues;Sentence completion    Non-Verbal Means of Communication Gestures      Oral Motor/Sensory Function   Overall Oral Motor/Sensory Function Impaired    Labial Symmetry Within Functional Limits    Labial Coordination Reduced    Lingual ROM Within Functional Limits    Lingual Symmetry Within Functional Limits    Lingual Coordination Reduced    Facial ROM Within Functional Limits    Facial Symmetry Within Functional Limits      Motor Speech   Overall  Motor Speech Impaired    Respiration Within functional limits    Phonation Normal    Resonance Within functional limits    Articulation Impaired    Level of Impairment Word    Intelligibility Intelligibility reduced    Word 25-49% accurate    Phrase 25-49% accurate    Sentence 0-24% accurate    Conversation 0-24% accurate    Motor Planning Impaired    Level of Impairment Word    Motor Speech Errors Aware;Unaware;Inconsistent    Effective Techniques Slow rate;Over-articulate      Standardized Assessments   Standardized Assessments  Other Assessment   Quick Aphasia Battery= severe                            SLP Education - 04/01/21 1545     Education Details eval results, possible goals    Person(s) Educated Patient;Child(ren)    Methods Explanation;Demonstration;Handout    Comprehension Verbalized understanding;Returned demonstration;Need further instruction              SLP Short Term Goals - 04/01/21 1716       SLP SHORT TERM GOAL #1   Title Pt will ID paraphasias/perseverations and attempt to correct errors with pause and retrial for 4/5 opportunities given occasional mod A over 2 sessions    Time 4    Period Weeks    Status New      SLP SHORT TERM GOAL #2   Title Given verbal or visual prompt, pt will correctly ID written word/object/icon in field of 4-6 to communicate wants/needs with 75% accuracy given occasional mod A over 2 sessions    Time 4    Period Weeks    Status New      SLP SHORT TERM GOAL #3   Title Pt will use multimodal/AAC to ID biographical information and personal interests given 2-6 choices with occasional mod A over 2 sessions    Time 4    Period Weeks    Status New      SLP SHORT TERM GOAL #4   Title Pt will verbally approximate 3 significant words for 5/10 trials given occasional fading to rare mod A over 2 sessions    Time 4    Period Weeks    Status New              SLP Long Term Goals - 04/01/21 1727        SLP LONG TERM GOAL #1   Title Pt will spontaneously use multimodal communication system for 4/5 opportunities to increase ability to express wants/needs given >2 choices over 2 sessions    Time 12    Period Weeks    Status  New      SLP LONG TERM GOAL #2   Title Pt will ID 80% of paraphasias/perseverations in a session and attempt to self-correct given occasional min A over 2 sessions    Time 12    Period Weeks    Status New      SLP LONG TERM GOAL #3   Title Caregiver will appropriate cue patient when apraxic/aphasic errors occurs for 4/5 opportunities over 2 sessions    Time 12    Period Weeks    Status New      SLP LONG TERM GOAL #4   Title Pt/daughter will report improved communication effectiveness via PROM by 2 points by last ST session    Time 12    Period Weeks    Status New              Plan - 04/01/21 1605     Clinical Impression Statement Lindsey [name] presents for OPST evaluation to address 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 Pt is accompanied by daughter, Lindsey [name]. Some mild improvements in expressive communication reported since last course of ST intervention (Nov 2021 to April 2022), but she still has "unclear moments." Pt's daughter reports she often interjects random word to stop pt's verbal perseverations. Quick Aphasia Battery (QAB) completed, which indicated severe expressive aphasia, moderate receptive aphasia, and severe verbal apraxia. Limited intelligible responses demonstrated during connected speech tasks, some semantic errors noted for related foils during word comprehension task (ex: giraffe for lion, trumpet for violin), reduced comprehension of more complex yes/no questions demonstrated, and usual verbal apraxic errors for verbal naming tasks exhibited. Pt intermittently aware of errors, with occasional facial grimacing, head shakes, and retrials exhibited. Semantic cues were occasionally effective and phonemic cues were usually effective  to improve apraxic and aphasic errors. Writing informally assessed with pt able to write name. Usual perseverations exhibited for additional writing tasks. Pt's daughter completed communication PROMs this session, which indicated patient has significant difficulty communicating with strangers, conversing over telephone, being apart of a conversation in a noisy environment, speaking to someone at distance or out in community, and providing detailed information. Pt is currently using gestures at home (including rolling to object wanted) and selecting object/word given choice of two. Given nearly a year since onset, pt may benefit from communication device to facilitate increased communication due to verbal apraxia and aphasia.  Skilled ST is recommended to address aphasia and apraxia impacting pt's abilty to communicate effecitvely with both familiar and unfamiliar listeners to convey wants/needs, safety concerns, and participate in social situations.    Speech Therapy Frequency 2x / week    Duration 12 weeks   POC written for 12 weeks to account for scheduling issues and missed visits, anticipate d/c after 8 weeks   Treatment/Interventions Compensatory strategies;Patient/family education;Functional tasks;Cueing hierarchy;Environmental controls;Cognitive reorganization;Multimodal communcation approach;Language facilitation;Compensatory techniques;Internal/external aids;SLP instruction and feedback;Other (comment)    Potential to Achieve Goals Fair    Potential Considerations Severity of impairments    Consulted and Agree with Plan of Care Patient;Family member/caregiver    Family Member Consulted daughter, Lindsey [name]             Patient will benefit from skilled therapeutic intervention in order to improve the following deficits and impairments:   Aphasia  Verbal apraxia    Problem List Patient Active Problem List   Diagnosis Date Noted   Pain due to onychomycosis of toenails of both feet  11/19/2020   Anemia 05/31/2020   Acute ischemic left MCA  stroke (HCC) 04/30/2020   Right hemiplegia (HCC) 04/30/2020   CVA (cerebral vascular accident) (HCC) 04/21/2020   Unspecified atrial fibrillation (HCC) 04/21/2020   Essential hypertension 04/21/2020   Alcohol abuse 04/21/2020    Lindsey ColonelKatherine V Brystol Wasilewski, MA CCC-SLP 04/01/2021, 6:09 PM  [place] [facility] [address] [place], [place], [address] Phone: [phone]   Fax:  [phone]  Name: Lindsey BruinColleen [name] MRN: [id number] Date of Birth: [birth year]

## 2021-04-01 NOTE — Therapy (Signed)
[facility] [address] [name], [place], [address] Phone: [phone]   Fax:  [phone]  Occupational Therapy Treatment  Patient Details  Name: Lindsey [name] MRN: [id number] Date of Birth: [birth year] Referring Provider (OT): Sula Soda, MD   Encounter Date: 04/01/2021   OT End of Session - 04/01/21 1733     Visit Number 1    Number of Visits 24    Date for OT Re-Evaluation 06/24/21    Authorization Type Medicare/ Medicaid    Authorization Time Period Pt has used all MCD visits    OT Start Time 1450    OT Stop Time 1530    OT Time Calculation (min) 40 min    Activity Tolerance Patient tolerated treatment well    Behavior During Therapy [facility] for tasks assessed/performed             Past Medical History:  Diagnosis Date   Diverticulosis    Hypertension    Uterine prolapse     History reviewed. No pertinent surgical history.  There were no vitals filed for this visit.   Subjective Assessment - 04/01/21 1452     Subjective  Denies pain    Pertinent History PMH significant for HTN, EtOH abuse, diverticulosis, CVA, July 2021, Botox June 2021    Patient Stated Goals trying to get better    Currently in Pain? No/denies                [facility] OT Assessment - 04/01/21 1713       Assessment   Medical Diagnosis L MCA CVA    Referring Provider (OT) Sula Soda, MD    Onset Date/Surgical Date 02/25/21    Hand Dominance Right      Precautions   Precautions Fall    Precaution Comments not driving. aphasia. sensory deficits in RUE.     Required Braces or Orthoses Other Brace/Splint    Other Brace/Splint has resting hand splint, AFO      Balance Screen   Has the patient fallen in the past 6 months No    Has the patient had a decrease in activity level because of a fear of falling?  No    Is the patient reluctant to leave their home because of a fear of falling?  No      Home  Environment    Family/patient expects to be discharged to: Private residence    Type of Home House    Home Access Level entry    Lives With Family      Prior Function   Level of Independence Independent    Vocation --   prior to CVA   Leisure watch TV      ADL   Eating/Feeding Needs assist with cutting food    Grooming Set up    Upper Body Bathing Moderate assistance   walkin shower uses cahir   Lower Body Bathing Moderate assistance    Upper Body Dressing Set up    Lower Body Dressing --   mod A with pants, shoes and socks max A   Toilet Transfer Moderate assistance;Minimal assistance   uses BSC at night   Tub/Shower Transfer Minimal assistance      IADL   Light Housekeeping --   folds laundry   Medication Management Is not capable of dispensing or managing own medication      Mobility   Mobility Status Needs assist    Mobility Status Comments uses walker at home  Written Expression   Handwriting --   unable due to hemiplegia     Vision - History   Baseline Vision Wears glasses all the time      Vision Assessment   Vision Assessment Vision not tested    Comment hx of right inattention      Cognition   Overall Cognitive Status Difficult to assess   due to aphasia   Memory Impaired    Memory Impairment Decreased long term memory;Decreased short term memory    Problem Solving Impaired      Sensation   Light Touch Impaired by gross assessment   for RUE   Hot/Cold Impaired by gross assessment   for RUE     Coordination   Gross Motor Movements are Fluid and Coordinated No    Fine Motor Movements are Fluid and Coordinated No    9 Hole Peg Test --   unable for RUE   Coordination impaired for RUE no finger movement      AROM   Overall AROM  Deficits    Overall AROM Comments RUE deficits. No active movement noted on  in shoulder and hand, Trace elbow flexion, wrist extension and pronation. Thumbs width subluxation      PROM   Overall PROM  Deficits    PROM Assessment Site  Shoulder    Right Shoulder Flexion 75 Degrees    Right Shoulder ABduction 45 Degrees      Hand Function   Right Hand Gross Grasp Impaired   no active movment     RUE Tone   RUE Tone Hypertonic;Modified Ashworth   spasticity paritcularly at hsoulder , less at elbow, recent botox injection     RUE Tone   Modified Ashworth Scale for Grading Hypertonia RUE More marked increase in muscle tone through most of the ROM, but affected part(s) easily moved                                OT Short Term Goals - 04/01/21 1728       OT SHORT TERM GOAL #1   Title Pt and caregiver will be independent with updated HEP    Baseline not issued yet    Time 4    Period Weeks    Status New      OT SHORT TERM GOAL #2   Title Pt will donn pants with min A    Time 4    Period Weeks    Status New      OT SHORT TERM GOAL #3   Title Pt will consistently demonstrate 30* active elbow flexion  in prep for  functional use.    Baseline trace elbow flexion    Time 4    Period Weeks    Status New      OT SHORT TERM GOAL #4   Title Pt consistently demonstrate 30 wrist extension in prep for functional use of RUE.    Baseline trace wrist extension    Time 4    Period Weeks    Status New      OT SHORT TERM GOAL #5   Title Pt will bathe her UB with min A    Baseline mod A    Time 4    Period Weeks    Status New      Additional Short Term Goals   Additional Short Term Goals Yes      OT SHORT  TERM GOAL #6   Title Pt will demonstrate 90* passive shoulder flexion without pain    Baseline 75*    Time 4    Period Weeks    Status New      OT SHORT TERM GOAL #7   Title I with adapted equipment/ adapted strategies for ADLS(ie rocker knife)    Baseline dependent    Time 4    Period Weeks    Status New               OT Long Term Goals - 04/01/21 1735       OT LONG TERM GOAL #1   Title Pt and caregiver will be independent with updated HEP 06/24/21    Baseline not issued     Time 12    Period Weeks    Status New    Target Date 06/24/21      OT LONG TERM GOAL #2   Title Pt will demonstrate ability to stand and pull up her pants with close supervision.    Baseline min A for standing, mod A for LB dressing    Time 12    Period Weeks    Status New      OT LONG TERM GOAL #3   Title Pt will use RUE as a stabilizer/ gross assist consistently at least 75% of the time during ADLS with min prompts    Baseline uses grossly 50% as stabilizer    Time 12    Period Weeks    Status New      OT LONG TERM GOAL #4   Title Pt will perform LB bathing with min A    Baseline mod A    Time 12    Period Weeks    Status New      OT LONG TERM GOAL #5   Title Pt will perform simple snack or beverage prep with close supervision.    Baseline not currently performing    Time 12    Period Weeks    Status New                   Plan - 04/01/21 1719     Clinical Impression Statement Patient presented to hospital on 04/21/20 with slurred speech and R sided weakness. 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 fib. Residual Deficits of right hemiplegia, and expressive > receptive aphasia. PMH: HTN, alcohol abuse, a-fib. Marland Kitchen Pt discharged from Inpatient Rehab 05/29/20. Recieved Home Health Services for two weeks. Pt previously received OT at neuro rehab, she was d/c on 01/13/21 due to insurance limitations. Pt s/p botox on 03/18/21. Pt returns to occupational therapy today with the following deficits: R hemiplegia, decreased balance, decreased functional mobility,  aphasia,and  cognitive deficits, Pt can benefit from skilled occupational therapy to address these deficits in order to maximize pt safety and I with daily activities. Pt is accompanied by her dtr who is her caregiver.    OT Occupational Profile and History Problem Focused Assessment - Including review of records relating to presenting problem    Occupational  performance deficits (Please refer to evaluation for details): ADL's;IADL's;Leisure    Body Structure / Function / Physical Skills ADL;Decreased knowledge of use of DME;Strength;Gait;GMC;Tone;Pain;Dexterity;Balance;UE functional use;Proprioception;ROM;IADL;Vision;Sensation;Coordination;FMC;Flexibility;Decreased knowledge of precautions    Cognitive Skills Attention;Energy/Drive;Perception;Understand;Sequencing;Problem Solve;Memory;Safety Awareness;Thought    Rehab Potential Good    Clinical Decision Making Limited treatment options, no task modification necessary    Comorbidities Affecting  Occupational Performance: None    Modification or Assistance to Complete Evaluation  No modification of tasks or assist necessary to complete eval    OT Frequency 2x / week   POC written for 12 weeks to account for scheduling issues and missed visits, anticipate d/c after 8 weeks   OT Duration 12 weeks    OT Treatment/Interventions Self-care/ADL training;Moist Heat;DME and/or AE instruction;Balance training;Aquatic Therapy;Therapeutic activities;Cognitive remediation/compensation;Therapeutic exercise;Visual/perceptual remediation/compensation;Passive range of motion;Neuromuscular education;Functional Mobility Training;Cryotherapy;Electrical Stimulation;Energy conservation;Manual Therapy;Patient/family education;Ultrasound;Paraffin;Fluidtherapy;Splinting    Plan update HEP For RUE, ADL strategies    OT Home Exercise Plan --    Recommended Other Services Pt receiving ST and PT    Consulted and Agree with Plan of Care Patient;Family member/caregiver    Family Member Consulted daughter             Patient will benefit from skilled therapeutic intervention in order to improve the following deficits and impairments:   Body Structure / Function / Physical Skills: ADL, Decreased knowledge of use of DME, Strength, Gait, GMC, Tone, Pain, Dexterity, Balance, UE functional use, Proprioception, ROM, IADL, Vision,  Sensation, Coordination, FMC, Flexibility, Decreased knowledge of precautions Cognitive Skills: Attention, Energy/Drive, Perception, Understand, Sequencing, Problem Solve, Memory, Safety Awareness, Thought     Visit Diagnosis: Frontal lobe and executive function deficit - Plan: Ot plan of care cert/re-cert  Attention and concentration deficit - Plan: Ot plan of care cert/re-cert  Unsteadiness on feet - Plan: Ot plan of care cert/re-cert  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) - Plan: Ot plan of care cert/re-cert  Muscle weakness (generalized) - Plan: Ot plan of care cert/re-cert  Other lack of coordination - Plan: Ot plan of care cert/re-cert  Visuospatial deficit - Plan: Ot plan of care cert/re-cert  Stiffness of right shoulder, not elsewhere classified - Plan: Ot plan of care cert/re-cert  Other abnormalities of gait and mobility - Plan: Ot plan of care cert/re-cert    Problem List Patient Active Problem List   Diagnosis Date Noted   Pain due to onychomycosis of toenails of both feet 11/19/2020   Anemia 05/31/2020   Acute ischemic left MCA stroke (HCC) 04/30/2020   Right hemiplegia (HCC) 04/30/2020   CVA (cerebral vascular accident) (HCC) 04/21/2020   Unspecified atrial fibrillation (HCC) 04/21/2020   Essential hypertension 04/21/2020   Alcohol abuse 04/21/2020    Kemuel Buchmann 04/01/2021, 5:59 PM  [facility] [address] [place], [place], [address] Phone: [phone]   Fax:  [phone]  Name: Madden Piazza MRN: [id number] Date of Birth: [birth year]

## 2021-04-02 NOTE — Therapy (Signed)
[facility] [address] [place], [place], [address] Phone: [phone]   Fax:  [phone]  Physical Therapy Evaluation  Patient Details  Name: Lindsey [name] MRN: [id number] Date of Birth: [birth year] Referring Provider (PT): Horton Chin, MD   Encounter Date: 04/01/2021   PT End of Session - 04/02/21 0801     Visit Number 1    Number of Visits 17    Date for PT Re-Evaluation 06/04/21    Authorization Type MCR/MCD    PT Start Time 1530    PT Stop Time 1615    PT Time Calculation (min) 45 min    Equipment Utilized During Treatment Gait belt    Activity Tolerance Patient tolerated treatment well    Behavior During Therapy [facility] for tasks assessed/performed             Past Medical History:  Diagnosis Date   Diverticulosis    Hypertension    Uterine prolapse     History reviewed. No pertinent surgical history.  There were no vitals filed for this visit.    Subjective Assessment - 04/01/21 1532     Subjective No changes in condiion since last round of PT    Patient is accompained by: Family member   Caregiver   Pertinent History HTN, Alcohol Abuse    Limitations Standing;Walking    Patient Stated Goals Be able to walk    Pain Onset More than a month ago                [facility] PT Assessment - 04/01/21 1550       Assessment   Medical Diagnosis L MCA CVA    Referring Provider (PT) Horton Chin, MD    Onset Date/Surgical Date 02/25/21    Hand Dominance Right      Precautions   Precautions Fall    Precaution Comments not driving. aphasia. sensory deficits in RUE.     Required Braces or Orthoses Other Brace/Splint    Other Brace/Splint R AFO      Balance Screen   Has the patient fallen in the past 6 months No      Home Environment   Living Environment Private residence    Living Arrangements Children    Available Help at Discharge Family    Type of Home House    Home Access  Other (comment);Ramped entrance    Home Layout Two level;Able to live on main level with bedroom/bathroom    Home Equipment Wheelchair - manual;Hospital bed;Bedside commode;Shower seat;Grab bars - tub/shower      Prior Function   Level of Independence Independent      Sensation   Light Touch Appears Intact    Additional Comments appears intact in lower leg      Strength   Right Hip Flexion 3/5    Right Hip Extension 3/5    Right Hip ABduction 2/5    Right Hip ADduction 2/5    Right Knee Flexion 3/5    Right Knee Extension 3/5      Transfers   Transfers Sit to Stand;Stand to Sit    Sit to Stand 4: Min guard    Sit to Stand Details Tactile cues for weight shifting;Tactile cues for placement;Verbal cues for sequencing;Verbal cues for technique;Verbal cues for safe use of DME/AE    Sit to Stand Details (indicate cue type and reason) cues to WS fwd    Five time sit to stand comments  18.3  Stand to Sit 4: Min guard    Stand to Sit Details (indicate cue type and reason) Verbal cues for precautions/safety;Verbal cues for technique;Verbal cues for sequencing    Stand to Sit Details controls descent well      Ambulation/Gait   Ambulation/Gait Yes    Ambulation/Gait Assistance 4: Min guard;4: Min assist    Ambulation/Gait Assistance Details RW and gait belt    Ambulation Distance (Feet) 115 Feet    Assistive device Rolling walker    Gait Pattern Step-through pattern;Decreased stance time - right;Decreased step length - left;Decreased dorsiflexion - right;Decreased weight shift to right    Ambulation Surface Level;Indoor                        Objective measurements completed on examination: See above findings.                 PT Short Term Goals - 04/01/21 1594       PT SHORT TERM GOAL #1   Title Patient to demo I in initial HEP    Baseline TBD    Time 4    Period Weeks    Status New    Target Date 05/07/21      PT SHORT TERM GOAL #2   Title  Patient will be able to ambulate with RW and SBA x 100 ft to demonstrate improved household mobility    Baseline 152ft with RW and CGA/MinA    Time 4    Period Weeks    Status New    Target Date 05/07/21      PT SHORT TERM GOAL #3   Title Patient to transfer to all positions with CGA    Baseline MnA/CGA for STS transfer from [facility]    Time 4    Period Weeks    Status New    Target Date 05/07/21      PT SHORT TERM GOAL #4   Title Patient to negotiate 4 steps with L rail with MinA and step to pattern    Baseline TBD    Time 4    Period Days    Status New    Target Date 05/07/21      PT SHORT TERM GOAL #5   Title Perform DGI and set goal    Baseline TBD    Time 4    Period Weeks    Status New    Target Date 05/07/21      Additional Short Term Goals   Additional Short Term Goals Yes      PT SHORT TERM GOAL #6   Title assess gait velocity and set goal    Baseline TBD    Time 4    Period Days    Status New    Target Date 05/07/21               PT Long Term Goals - 04/02/21 0820       PT LONG TERM GOAL #1   Title Patient will be independent with final HEP and walking program within the home with caregiver assistance if needed    Baseline TBD based on previous HEP    Time 8   th visit   Period Weeks    Status New    Target Date 06/04/21      PT LONG TERM GOAL #2   Title Patient to perform all transfers with SBA( except floor transfers)    Baseline CGA/MinA for STS  transfers    Time 8    Period Weeks    Status New    Target Date 06/04/21      PT LONG TERM GOAL #3   Title Patient to ambulate 552ft with RW, R AFO with SBA    Baseline 173ft in clinic with RW and R AFO using CGA.MinA    Time 8    Period Weeks    Status New    Target Date 06/04/21      PT LONG TERM GOAL #4   Title Assess progress towards gait speed    Baseline TBD    Time 8    Period Weeks    Status New    Target Date 06/04/21      PT LONG TERM GOAL #5   Title Assess progress towards  DGI    Baseline TBD    Time 8    Period Weeks    Status New    Target Date 06/04/21                    Plan - 04/02/21 0804     Clinical Impression Statement Patient returns to OPPT following insurance change, she lives with dtr and family i coverted garage, ramped entry, she is CGA/SBA for transfers, she ambulates with RW and R AFO 166ft in clinic, strength deficits to RLE, recently received Botox to RUE.  Suffered VA 7/21 and presents with residual strength and tone deficits to R side.  PAtient a good cadidate to return to household ambulation and independence in transfers.  Expressive and receptive aphasia present    Personal Factors and Comorbidities Comorbidity 2    Comorbidities HTN, Alcohol Abuse, Diverticulosis    Examination-Activity Limitations Bed Mobility;Bathing;Sit;Transfers;Dressing;Stairs;Stand;Locomotion Level;Toileting    Examination-Participation Restrictions Occupation;Community Activity;Driving    Stability/Clinical Decision Making Evolving/Moderate complexity    Clinical Decision Making Moderate    Rehab Potential Good    PT Frequency 2x / week    PT Duration 8 weeks   1 week - last authorized Medicaid visit   PT Treatment/Interventions ADLs/Self Care Home Management;Electrical Stimulation;DME Instruction;Gait training;Stair training;Therapeutic activities;Functional mobility training;Therapeutic exercise;Balance training;Neuromuscular re-education;Patient/family education;Orthotic Fit/Training;Wheelchair mobility training;Manual techniques;Passive range of motion    PT Next Visit Plan Establish HEP and review transfer and mobility skills    PT Home Exercise Plan Access Code: [email or web address]    Consulted and Agree with Plan of Care Patient;Family member/caregiver    Family Member Consulted Daughter             Patient will benefit from skilled therapeutic intervention in order to improve the following deficits and impairments:  Abnormal gait, Decreased  balance, Decreased mobility, Decreased endurance, Difficulty walking, Impaired sensation, Impaired UE functional use, Decreased strength, Decreased safety awareness, Decreased knowledge of use of DME, Decreased activity tolerance, Decreased range of motion, Impaired tone, Improper body mechanics  Visit Diagnosis: 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)  Muscle weakness (generalized)  Unsteadiness on feet     Problem List Patient Active Problem List   Diagnosis Date Noted   Pain due to onychomycosis of toenails of both feet 11/19/2020   Anemia 05/31/2020   Acute ischemic left MCA stroke (HCC) 04/30/2020   Right hemiplegia (HCC) 04/30/2020   CVA (cerebral vascular accident) (HCC) 04/21/2020   Unspecified atrial fibrillation (HCC) 04/21/2020   Essential hypertension 04/21/2020   Alcohol abuse 04/21/2020    Farris Has Tobin Cadiente PT 04/02/2021, 8:29 AM  [facility] [address] [place], [place], [address] Phone: [phone]   Fax:  [phone]  Name: Guerry BruinColleen Bo MRN: [id number] Date of Birth: Feb 17, 1956

## 2021-04-19 ENCOUNTER — Other Ambulatory Visit: Payer: Self-pay

## 2021-04-19 ENCOUNTER — Ambulatory Visit: Payer: Medicare Other | Attending: Physical Medicine and Rehabilitation

## 2021-04-19 ENCOUNTER — Ambulatory Visit: Payer: Medicare Other | Admitting: Occupational Therapy

## 2021-04-19 ENCOUNTER — Ambulatory Visit: Payer: Medicare Other

## 2021-04-19 DIAGNOSIS — R4701 Aphasia: Secondary | ICD-10-CM | POA: Insufficient documentation

## 2021-04-19 DIAGNOSIS — R41844 Frontal lobe and executive function deficit: Secondary | ICD-10-CM

## 2021-04-19 DIAGNOSIS — R41840 Attention and concentration deficit: Secondary | ICD-10-CM

## 2021-04-19 DIAGNOSIS — R278 Other lack of coordination: Secondary | ICD-10-CM | POA: Insufficient documentation

## 2021-04-19 DIAGNOSIS — M25611 Stiffness of right shoulder, not elsewhere classified: Secondary | ICD-10-CM | POA: Diagnosis present

## 2021-04-19 DIAGNOSIS — I69851 Hemiplegia and hemiparesis following other cerebrovascular disease affecting right dominant side: Secondary | ICD-10-CM | POA: Diagnosis present

## 2021-04-19 DIAGNOSIS — M6281 Muscle weakness (generalized): Secondary | ICD-10-CM | POA: Diagnosis present

## 2021-04-19 DIAGNOSIS — R2681 Unsteadiness on feet: Secondary | ICD-10-CM

## 2021-04-19 DIAGNOSIS — R482 Apraxia: Secondary | ICD-10-CM

## 2021-04-19 NOTE — Patient Instructions (Signed)
Access Code: [email or web address] URL: [email or web address] Date: 04/19/2021 Prepared by: Gustavus [name]  Exercises Seated Hip Abduction - 1 x daily - 7 x weekly - 3 sets - 10 reps Seated Knee Flexion - 1 x daily - 7 x weekly - 3 sets - 10 reps Seated Long Arc Quad - 2 x daily - 7 x weekly - 3 sets - 10 reps

## 2021-04-19 NOTE — Therapy (Signed)
[facility] [address] [place], [place], [address] Phone: [phone]   Fax:  [phone]  Occupational Therapy Treatment  Patient Details  Name: Lindsey [name] MRN: [id number] Date of Birth: November 09, 1955 Referring Provider (OT): Sula Soda, MD   Encounter Date: 04/19/2021   OT End of Session - 04/19/21 1622     Visit Number 2    Number of Visits 24    Date for OT Re-Evaluation 06/24/21    Authorization Type Medicare/ Medicaid    Authorization Time Period Pt has used all MCD visits    Authorization - Visit Number 2    Authorization - Number of Visits 10    Progress Note Due on Visit 10    OT Start Time 1452    OT Stop Time 1530    OT Time Calculation (min) 38 min    Activity Tolerance Patient tolerated treatment well    Behavior During Therapy [facility] for tasks assessed/performed             Past Medical History:  Diagnosis Date   Diverticulosis    Hypertension    Uterine prolapse     No past surgical history on file.  There were no vitals filed for this visit.   Subjective Assessment - 04/19/21 1622     Pertinent History PMH significant for HTN, EtOH abuse, diverticulosis, CVA, July 2021, Botox June 2021    Limitations Fall Risk. Not Driving. Aphasia. Sensory deficits RUE. Apraxia.    Patient Stated Goals trying to get better    Currently in Pain? No/denies                  Treatment: stand pivot transfer min A.Supine on mat lower then upper trunk rotation, mod facilitation/ v.c Sidelying on right side, therapist performed P/ROM to elbow, forearm and wrist, AA/ROM elbow flexion/ extension, max facilitation. Seated self stretch reach for floor with mod facilitation to weight shift to right. Seated trunk rotation with hadns clasped, min facilitation. Lateral trunk flexion weight bearing through right elbow, mod facilitation. Pt reports arm feels better at end of  session.                  OT Short Term Goals - 04/01/21 1728       OT SHORT TERM GOAL #1   Title Pt and caregiver will be independent with updated HEP    Baseline not issued yet    Time 4    Period Weeks    Status New      OT SHORT TERM GOAL #2   Title Pt will donn pants with min A    Time 4    Period Weeks    Status New      OT SHORT TERM GOAL #3   Title Pt will consistently demonstrate 30* active elbow flexion  in prep for  functional use.    Baseline trace elbow flexion    Time 4    Period Weeks    Status New      OT SHORT TERM GOAL #4   Title Pt consistently demonstrate 30 wrist extension in prep for functional use of RUE.    Baseline trace wrist extension    Time 4    Period Weeks    Status New      OT SHORT TERM GOAL #5   Title Pt will bathe her UB with min A    Baseline mod A    Time 4  Period Weeks    Status New      Additional Short Term Goals   Additional Short Term Goals Yes      OT SHORT TERM GOAL #6   Title Pt will demonstrate 90* passive shoulder flexion without pain    Baseline 75*    Time 4    Period Weeks    Status New      OT SHORT TERM GOAL #7   Title I with adapted equipment/ adapted strategies for ADLS(ie rocker knife)    Baseline dependent    Time 4    Period Weeks    Status New               OT Long Term Goals - 04/01/21 1735       OT LONG TERM GOAL #1   Title Pt and caregiver will be independent with updated HEP 06/24/21    Baseline not issued    Time 12    Period Weeks    Status New    Target Date 06/24/21      OT LONG TERM GOAL #2   Title Pt will demonstrate ability to stand and pull up her pants with close supervision.    Baseline min A for standing, mod A for LB dressing    Time 12    Period Weeks    Status New      OT LONG TERM GOAL #3   Title Pt will use RUE as a stabilizer/ gross assist consistently at least 75% of the time during ADLS with min prompts    Baseline uses grossly 50% as  stabilizer    Time 12    Period Weeks    Status New      OT LONG TERM GOAL #4   Title Pt will perform LB bathing with min A    Baseline mod A    Time 12    Period Weeks    Status New      OT LONG TERM GOAL #5   Title Pt will perform simple snack or beverage prep with close supervision.    Baseline not currently performing    Time 12    Period Weeks    Status New                   Plan - 04/19/21 1622     Clinical Impression Statement Pt reports improved comfort and flexibility in RUE at end of session.    OT Occupational Profile and History Problem Focused Assessment - Including review of records relating to presenting problem    Occupational performance deficits (Please refer to evaluation for details): ADL's;IADL's;Leisure    Body Structure / Function / Physical Skills ADL;Decreased knowledge of use of DME;Strength;Gait;GMC;Tone;Pain;Dexterity;Balance;UE functional use;Proprioception;ROM;IADL;Vision;Sensation;Coordination;FMC;Flexibility;Decreased knowledge of precautions    Cognitive Skills Attention;Energy/Drive;Perception;Understand;Sequencing;Problem Solve;Memory;Safety Awareness;Thought    Rehab Potential Good    Clinical Decision Making Limited treatment options, no task modification necessary    Comorbidities Affecting Occupational Performance: None    Modification or Assistance to Complete Evaluation  No modification of tasks or assist necessary to complete eval    OT Frequency 2x / week   POC written for 12 weeks to account for scheduling issues and missed visits, anticipate d/c after 8 weeks   OT Duration 12 weeks    OT Treatment/Interventions Self-care/ADL training;Moist Heat;DME and/or AE instruction;Balance training;Aquatic Therapy;Therapeutic activities;Cognitive remediation/compensation;Therapeutic exercise;Visual/perceptual remediation/compensation;Passive range of motion;Neuromuscular education;Functional Mobility Training;Cryotherapy;Electrical  Stimulation;Energy conservation;Manual Therapy;Patient/family education;Ultrasound;Paraffin;Fluidtherapy;Splinting    Plan HEP for  RUE as able- pt has aphasia, ADL strategies    Recommended Other Services Pt receiving ST and PT    Consulted and Agree with Plan of Care Patient;Family member/caregiver    Family Member Consulted daughter             Patient will benefit from skilled therapeutic intervention in order to improve the following deficits and impairments:   Body Structure / Function / Physical Skills: ADL, Decreased knowledge of use of DME, Strength, Gait, GMC, Tone, Pain, Dexterity, Balance, UE functional use, Proprioception, ROM, IADL, Vision, Sensation, Coordination, FMC, Flexibility, Decreased knowledge of precautions Cognitive Skills: Attention, Energy/Drive, Perception, Understand, Sequencing, Problem Solve, Memory, Safety Awareness, Thought     Visit Diagnosis: Frontal lobe and executive function deficit  Attention and concentration deficit  Unsteadiness on feet  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)  Muscle weakness (generalized)  Other lack of coordination  Stiffness of right shoulder, not elsewhere classified    Problem List Patient Active Problem List   Diagnosis Date Noted   Pain due to onychomycosis of toenails of both feet 11/19/2020   Anemia 05/31/2020   Acute ischemic left MCA stroke (HCC) 04/30/2020   Right hemiplegia (HCC) 04/30/2020   CVA (cerebral vascular accident) (HCC) 04/21/2020   Unspecified atrial fibrillation (HCC) 04/21/2020   Essential hypertension 04/21/2020   Alcohol abuse 04/21/2020    Jadelyn Elks 04/19/2021, 4:24 PM  [facility] [address] [place], [place], [address] Phone: [phone]   Fax:  [phone]  Name: Arryn Terrones MRN: [id number] Date of Birth: 07-Sep-1956

## 2021-04-19 NOTE — Therapy (Signed)
[facility] [address] [place], [place], [address] Phone: [phone]   Fax:  [phone]  Physical Therapy Treatment  Patient Details  Name: Lindsey [name] MRN: [id number] Date of Birth: [birth year] Referring Provider (PT): Horton Chin, MD   Encounter Date: 04/19/2021   PT End of Session - 04/19/21 1702     Visit Number 2    Number of Visits 17    Date for PT Re-Evaluation 06/04/21    Authorization Type MCR/MCD    Progress Note Due on Visit 10    PT Start Time 1615    PT Stop Time 1700    PT Time Calculation (min) 45 min    Equipment Utilized During Treatment Gait belt    Activity Tolerance Patient tolerated treatment well    Behavior During Therapy [facility] for tasks assessed/performed             Past Medical History:  Diagnosis Date   Diverticulosis    Hypertension    Uterine prolapse     No past surgical history on file.  There were no vitals filed for this visit.   Subjective Assessment - 04/19/21 1617     Subjective No changes to report, no falls or pain    Patient is accompained by: Family member   Caregiver   Pertinent History HTN, Alcohol Abuse    Limitations Standing;Walking    How long can you sit comfortably? unlimited    How long can you stand comfortably? <5 min    How long can you walk comfortably? <82min    Patient Stated Goals Be able to walk    Pain Onset More than a month ago                               [facility] Adult PT Treatment/Exercise - 04/19/21 0001       Transfers   Transfers Sit to Stand;Stand to Sit    Sit to Stand 4: Min guard;4: Min assist    Sit to Stand Details Tactile cues for weight shifting;Tactile cues for placement;Verbal cues for sequencing;Verbal cues for technique;Verbal cues for safe use of DME/AE    Sit to Stand Details (indicate cue type and reason) VCs toWS fwd    Stand to Sit 5: Supervision    Stand to Sit Details (indicate  cue type and reason) Verbal cues for precautions/safety;Verbal cues for technique;Verbal cues for sequencing    Stand to Sit Details controls descent with hands    Comments 5 reps      Ambulation/Gait   Ambulation/Gait Yes    Ambulation/Gait Assistance 4: Min guard;4: Min assist    Ambulation/Gait Assistance Details RW w/gait belt    Ambulation Distance (Feet) 230 Feet    Assistive device Rolling walker    Gait Pattern Step-to pattern;Step-through pattern;Decreased arm swing - right;Decreased stride length;Decreased hip/knee flexion - right    Ambulation Surface Level;Indoor      Knee/Hip Exercises: Stretches   Passive Hamstring Stretch Right;1 rep;Limitations    Passive Hamstring Stretch Limitations 2 min hold      Knee/Hip Exercises: Seated   Long Arc Quad Strengthening;Both;1 set;15 reps;Limitations    Long Arc Quad Limitations tactile assist    Heel Slides Strengthening;Both;1 set;15 reps;Limitations    Heel Slides Limitations towel for friction    Knee/Hip Flexion marching 15x B    Abduction/Adduction  Strengthening;Both;1 set;15 reps;Limitations    Abd/Adduction Limitations  slight manual resistance                 Balance Exercises - 04/19/21 0001       Balance Exercises: Standing   Step Ups Forward;2 inch;UE support 1;Limitations    Step Ups Limitations 10 reps each leg with TCs for WS and posture to assist RLE    Partial Tandem Stance Eyes open;1 rep;30 secs;Limitations    Partial Tandem Stance Limitations performed in tandem stance B then narrow BOS                 PT Short Term Goals - 04/19/21 1706       PT SHORT TERM GOAL #1   Title Patient to demo I in initial HEP    Baseline TBD; 04/19/21 Reviewed and added to today.    Time 4    Period Weeks    Status New    Target Date 05/07/21      PT SHORT TERM GOAL #2   Title Patient will be able to ambulate with RW and SBA x 100 ft to demonstrate improved household mobility    Baseline 176ft with RW  and CGA/MinA    Time 4    Period Weeks    Status New    Target Date 05/07/21      PT SHORT TERM GOAL #3   Title Patient to transfer to all positions with CGA    Baseline MnA/CGA for STS transfer from [facility]    Time 4    Period Weeks    Status New    Target Date 05/07/21      PT SHORT TERM GOAL #4   Title Patient to negotiate 4 steps with L rail with MinA and step to pattern    Baseline TBD    Time 4    Period Days    Status New    Target Date 05/07/21      PT SHORT TERM GOAL #5   Title Perform DGI and set goal    Baseline TBD    Time 4    Period Weeks    Status New    Target Date 05/07/21      PT SHORT TERM GOAL #6   Title assess gait velocity and set goal    Baseline TBD    Time 4    Period Days    Status New    Target Date 05/07/21               PT Long Term Goals - 04/02/21 0820       PT LONG TERM GOAL #1   Title Patient will be independent with final HEP and walking program within the home with caregiver assistance if needed    Baseline TBD based on previous HEP    Time 8   th visit   Period Weeks    Status New    Target Date 06/04/21      PT LONG TERM GOAL #2   Title Patient to perform all transfers with SBA( except floor transfers)    Baseline CGA/MinA for STS transfers    Time 8    Period Weeks    Status New    Target Date 06/04/21      PT LONG TERM GOAL #3   Title Patient to ambulate 531ft with RW, R AFO with SBA    Baseline 136ft in clinic with RW and R AFO using CGA.MinA    Time 8  Period Weeks    Status New    Target Date 06/04/21      PT LONG TERM GOAL #4   Title Assess progress towards gait speed    Baseline TBD    Time 8    Period Weeks    Status New    Target Date 06/04/21      PT LONG TERM GOAL #5   Title Assess progress towards DGI    Baseline TBD    Time 8    Period Weeks    Status New    Target Date 06/04/21                   Plan - 04/19/21 1702     Clinical Impression Statement Todays session  consisted of BLE exercises to defer  tone in R quad and elicit hamstring activitiy, passive R hamstring stretch prior to activity, stepping tasks and transfer training.  Verbal and tactile cues for fwd WS when standing as patient retropulses. TCs issued when ambulating to faciklitate L WS to allow RLE swing through w/o foot scuff.    Personal Factors and Comorbidities Comorbidity 2    Comorbidities HTN, Alcohol Abuse, Diverticulosis    Examination-Activity Limitations Bed Mobility;Bathing;Sit;Transfers;Dressing;Stairs;Stand;Locomotion Level;Toileting    Examination-Participation Restrictions Occupation;Community Activity;Driving    Stability/Clinical Decision Making Evolving/Moderate complexity    Clinical Decision Making Moderate    Rehab Potential Good    PT Frequency 2x / week    PT Duration 8 weeks   1 week - last authorized Medicaid visit   PT Treatment/Interventions ADLs/Self Care Home Management;Electrical Stimulation;DME Instruction;Gait training;Stair training;Therapeutic activities;Functional mobility training;Therapeutic exercise;Balance training;Neuromuscular re-education;Patient/family education;Orthotic Fit/Training;Wheelchair mobility training;Manual techniques;Passive range of motion    PT Next Visit Plan Update HEP as needed and add supine tasks    PT Home Exercise Plan Access Code: [email or web address]    Consulted and Agree with Plan of Care Patient;Family member/caregiver    Family Member Consulted Daughter             Patient will benefit from skilled therapeutic intervention in order to improve the following deficits and impairments:  Abnormal gait, Decreased balance, Decreased mobility, Decreased endurance, Difficulty walking, Impaired sensation, Impaired UE functional use, Decreased strength, Decreased safety awareness, Decreased knowledge of use of DME, Decreased activity tolerance, Decreased range of motion, Impaired tone, Improper body mechanics  Visit Diagnosis: Unsteadiness  on feet  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 [place], [place], [address] Phone: [phone]   Fax:  [phone]  Name: Lindsey [name] MRN: [id number] Date of Birth: 1956-02-01

## 2021-04-20 NOTE — Therapy (Signed)
[facility] [address] [place], [place], [address] Phone: [phone]   Fax:  [phone]  Speech Language Pathology Treatment  Patient Details  Name: Lindsey [name] MRN: [id number] Date of Birth: May 07, 1956 Referring Provider (SLP): Carlis Abbott Drema Pry, MD   Encounter Date: 04/19/2021   End of Session - 04/20/21 1442     Visit Number 2    Number of Visits 25    Date for SLP Re-Evaluation 06/24/21    Authorization Type Medicare    SLP Start Time 1530    SLP Stop Time  1615    SLP Time Calculation (min) 45 min    Activity Tolerance Patient tolerated treatment well             Past Medical History:  Diagnosis Date   Diverticulosis    Hypertension    Uterine prolapse     History reviewed. No pertinent surgical history.  There were no vitals filed for this visit.   Subjective Assessment - 04/19/21 1531     Subjective "trying to get well"    Patient is accompained by: Family member   sister   Currently in Pain? No/denies                   ADULT SLP TREATMENT - 04/19/21 1531       General Information   Behavior/Cognition Alert;Cooperative;Pleasant mood      Treatment Provided   Treatment provided Cognitive-Linquistic      Cognitive-Linquistic Treatment   Treatment focused on Aphasia;Apraxia;Patient/family/caregiver education    Skilled Treatment Pt was accompanied by her sister, Lindsey [name], this session. Pt noted with occasional clear automatic responses to SLP questions. SLP targeted divergent naming of family members. Usual perseveration noted on "Debra." Pt able to spontaneously name brother via writing and verbally name sisters 3/4 sisters. Pt able to order siblings with usual mod A due to perseverations. Pt unable to name grandchildren without consistent max cues, with apraxic error for naming "Thurston Pounds." SLP trialed Lingraphica device, in which pt able to ID icons with ~30% accuracy given  occasional min A. Possible right neglect indicated. Pt demonstrated awareness of errors when wrong icon was selected.      Assessment / Recommendations / Plan   Plan Continue with current plan of care      Progression Toward Goals   Progression toward goals Progressing toward goals              SLP Education - 04/20/21 1442     Education Details Lingraphica trials, multimodal communication    Person(s) Educated Patient;Caregiver(s)    Methods Explanation;Demonstration    Comprehension Verbalized understanding;Returned demonstration;Need further instruction              SLP Short Term Goals - 04/20/21 1442       SLP SHORT TERM GOAL #1   Title Pt will ID paraphasias/perseverations and attempt to correct errors with pause and retrial for 4/5 opportunities given occasional mod A over 2 sessions    Time 4    Period Weeks    Status On-going      SLP SHORT TERM GOAL #2   Title Given verbal or visual prompt, pt will correctly ID written word/object/icon in field of 4-6 to communicate wants/needs with 75% accuracy given occasional mod A over 2 sessions    Time 4    Period Weeks    Status On-going      SLP SHORT TERM GOAL #3   Title  Pt will use multimodal/AAC to ID biographical information and personal interests given 2-6 choices with occasional mod A over 2 sessions    Time 4    Period Weeks    Status On-going      SLP SHORT TERM GOAL #4   Title Pt will verbally approximate 3 significant words for 5/10 trials given occasional fading to rare mod A over 2 sessions    Time 4    Period Weeks    Status On-going              SLP Long Term Goals - 04/20/21 1443       SLP LONG TERM GOAL #1   Title Pt will spontaneously use multimodal communication system for 4/5 opportunities to increase ability to express wants/needs given >2 choices over 2 sessions    Time 12    Period Weeks    Status On-going      SLP LONG TERM GOAL #2   Title Pt will ID 80% of  paraphasias/perseverations in a session and attempt to self-correct given occasional min A over 2 sessions    Time 12    Period Weeks    Status On-going      SLP LONG TERM GOAL #3   Title Caregiver will appropriate cue patient when apraxic/aphasic errors occurs for 4/5 opportunities over 2 sessions    Time 12    Period Weeks    Status On-going      SLP LONG TERM GOAL #4   Title Pt/daughter will report improved communication effectiveness via PROM by 2 points by last ST session    Time 12    Period Weeks    Status On-going              Plan - 04/20/21 1555     Clinical Impression Statement Lindsey [name] presents for OPST intervention to address 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 Pt is accompanied by sister, Lindsey [name]. SLP targeted functional naming of family members, automatic sequences, and sentence completion. Usual perseverations and occasional apraxic errors noted, which required max cues to correct. SLP introduced trials of Lingraphica this session. 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  Skilled ST is recommended to address aphasia and apraxia impacting pt's abilty to communicate effecitvely with both familiar and unfamiliar listeners to convey wants/needs, safety concerns, and participate in social situations.    Speech Therapy Frequency 2x / week    Duration 12 weeks   POC written for 12 weeks to account for scheduling issues and missed visits, anticipate d/c after 8 weeks   Treatment/Interventions Compensatory strategies;Patient/family education;Functional tasks;Cueing hierarchy;Environmental controls;Cognitive reorganization;Multimodal communcation approach;Language facilitation;Compensatory techniques;Internal/external aids;SLP instruction and feedback;Other (comment)    Potential to Achieve Goals Fair    Potential Considerations Severity of impairments    Consulted and  Agree with Plan of Care Patient;Family member/caregiver             Patient will benefit from skilled therapeutic intervention in order to improve the following deficits and impairments:   Verbal apraxia  Aphasia    Problem List Patient Active Problem List   Diagnosis Date Noted   Pain due to onychomycosis of toenails of both feet 11/19/2020   Anemia 05/31/2020   Acute ischemic left MCA stroke (HCC) 04/30/2020   Right hemiplegia (HCC) 04/30/2020   CVA (cerebral vascular accident) (HCC) 04/21/2020   Unspecified atrial fibrillation (HCC) 04/21/2020   Essential hypertension 04/21/2020  Alcohol abuse 04/21/2020    Janann Colonel, MA CCC-SLP 04/20/2021, 3:57 PM  [facility] [address] [place], [place], [address] Phone: [phone]   Fax:  [phone]   Name: Lindsey [name] MRN: [id number] Date of Birth: 1956-02-05

## 2021-04-22 ENCOUNTER — Other Ambulatory Visit: Payer: Self-pay

## 2021-04-22 ENCOUNTER — Encounter: Payer: Self-pay | Admitting: Podiatry

## 2021-04-22 ENCOUNTER — Ambulatory Visit (INDEPENDENT_AMBULATORY_CARE_PROVIDER_SITE_OTHER): Payer: Medicare Other | Admitting: Podiatry

## 2021-04-22 ENCOUNTER — Ambulatory Visit: Payer: Medicare Other

## 2021-04-22 ENCOUNTER — Ambulatory Visit: Payer: Medicare Other | Admitting: Rehabilitative and Restorative Service Providers"

## 2021-04-22 ENCOUNTER — Ambulatory Visit: Payer: Medicare Other | Admitting: Occupational Therapy

## 2021-04-22 DIAGNOSIS — M79674 Pain in right toe(s): Secondary | ICD-10-CM

## 2021-04-22 DIAGNOSIS — B351 Tinea unguium: Secondary | ICD-10-CM

## 2021-04-22 DIAGNOSIS — M79675 Pain in left toe(s): Secondary | ICD-10-CM | POA: Diagnosis not present

## 2021-04-22 NOTE — Progress Notes (Signed)
This patient returns to my office for at risk foot care.  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  She presents to the office with her daughter.This patient is unable to cut nails herself since the patient cannot reach her nails.These nails are painful walking and wearing shoes.  This patient presents for at risk foot care today.  General Appearance  Alert, conversant and in no acute stress.  Vascular  Dorsalis pedis and posterior tibial  pulses are palpable  Left.  Absent dorsalis pedis and posterior tibial pulses left foot..  Capillary return is within normal limits  bilaterally. Temperature is within normal limits left foot.  Cold and swollen foot right..  Neurologic  Senn-Weinstein monofilament wire test within normal limits  bilaterally. Muscle power within normal limits left.  Absent ROM and muscle strength right..  Nails Thick disfigured discolored nails with subungual debris  from hallux to fifth toes bilaterally. No evidence of bacterial infection or drainage bilaterally.  Orthopedic  No limitations of motion  feet .  No crepitus or effusions noted.  No bony pathology or digital deformities noted.  Skin  normotropic skin with no porokeratosis noted bilaterally.  No signs of infections or ulcers noted.     Onychomycosis  Pain in right toes  Pain in left toes  Consent was obtained for treatment procedures.   Mechanical debridement of nails 1-5  bilaterally performed with a nail nipper.  Filed with dremel without incident.    Return office visit  3 months                    Told patient to return for periodic foot care and evaluation due to potential at risk complications.   Helane Gunther DPM

## 2021-04-27 ENCOUNTER — Ambulatory Visit: Payer: Medicare Other

## 2021-04-27 ENCOUNTER — Ambulatory Visit: Payer: Medicare Other | Admitting: Occupational Therapy

## 2021-04-29 ENCOUNTER — Ambulatory Visit: Payer: Medicare Other

## 2021-04-29 ENCOUNTER — Other Ambulatory Visit: Payer: Self-pay

## 2021-04-29 ENCOUNTER — Ambulatory Visit: Payer: Medicare Other | Admitting: Occupational Therapy

## 2021-04-29 DIAGNOSIS — R4701 Aphasia: Secondary | ICD-10-CM

## 2021-04-29 DIAGNOSIS — R2681 Unsteadiness on feet: Secondary | ICD-10-CM | POA: Diagnosis not present

## 2021-04-29 DIAGNOSIS — R482 Apraxia: Secondary | ICD-10-CM

## 2021-04-29 NOTE — Therapy (Signed)
[facility] [address] [place], [place], [address] Phone: [phone]   Fax:  [phone]  Speech Language Pathology Treatment  Patient Details  Name: Lindsey [name] MRN: [id number] Date of Birth: 1956-03-18 Referring Provider (SLP): Carlis Abbott Drema Pry, MD   Encounter Date: 04/29/2021   End of Session - 04/29/21 1656     Visit Number 3    Number of Visits 25    Date for SLP Re-Evaluation 06/24/21    Authorization Type Medicare    SLP Start Time 1535    SLP Stop Time  1615    SLP Time Calculation (min) 40 min    Activity Tolerance Patient tolerated treatment well             Past Medical History:  Diagnosis Date   Diverticulosis    Hypertension    Uterine prolapse     History reviewed. No pertinent surgical history.  There were no vitals filed for this visit.   Subjective Assessment - 04/29/21 1537     Subjective "just working and trying to stay out of trouble"    Currently in Pain? No/denies                   ADULT SLP TREATMENT - 04/29/21 1535       General Information   Behavior/Cognition Alert;Cooperative;Pleasant mood      Treatment Provided   Treatment provided Cognitive-Linquistic      Cognitive-Linquistic Treatment   Treatment focused on Aphasia;Apraxia;Patient/family/caregiver education    Skilled Treatment Some spontaneous connected speech exhibited in response to SLP opening questions (see "subjective"). Pt's daughter present this session and provided feedback re: HEP. Daughter read aloud sentences and pt matched sentence to picture with good accuracy. Daughter provided initial prompt to initiate verbalization of phrase for dialogue boxes, which was reportedly effective. Reading informally assessed with usual neologisms and paraphasias exhibited. Pt able to repeat short phrases with 80% accuracy given SLP modeling. Pt able to verbally name picture with 20% accuracy given usual  neologisms and apraxic errors. SLP targeted generation of functional phrases at home. Usual fading to occasional SLP modeling and mod to max verbal/visual cues required for articulation words/short phrases. SLP trialed Lingraphica page re: food. Pt able to accurately produce words given verbal model with 60% accuracy. Given verbal SLP prompt, pt able to ID corresponding picture to verbal word with 60% accuracy.      Assessment / Recommendations / Plan   Plan Continue with current plan of care      Progression Toward Goals   Progression toward goals Progressing toward goals              SLP Education - 04/29/21 1655     Education Details Lingraphica, try words with same sounds when pt perseverates    Person(s) Educated Patient;Child(ren)    Methods Explanation;Demonstration    Comprehension Returned demonstration;Need further instruction;Verbalized understanding              SLP Short Term Goals - 04/29/21 1658       SLP SHORT TERM GOAL #1   Title Pt will ID paraphasias/perseverations and attempt to correct errors with pause and retrial for 4/5 opportunities given occasional mod A over 2 sessions    Time 3    Period Weeks    Status On-going      SLP SHORT TERM GOAL #2   Title Given verbal or visual prompt, pt will correctly ID written word/object/icon in field of  4-6 to communicate wants/needs with 75% accuracy given occasional mod A over 2 sessions    Time 3    Period Weeks    Status On-going      SLP SHORT TERM GOAL #3   Title Pt will use multimodal/AAC to ID biographical information and personal interests given 2-6 choices with occasional mod A over 2 sessions    Time 3    Period Weeks    Status On-going      SLP SHORT TERM GOAL #4   Title Pt will verbally approximate 3 significant words for 5/10 trials given occasional fading to rare mod A over 2 sessions    Time 3    Period Weeks    Status On-going              SLP Long Term Goals - 04/29/21 1658        SLP LONG TERM GOAL #1   Title Pt will spontaneously use multimodal communication system for 4/5 opportunities to increase ability to express wants/needs given >2 choices over 2 sessions    Time 11    Period Weeks    Status On-going      SLP LONG TERM GOAL #2   Title Pt will ID 80% of paraphasias/perseverations in a session and attempt to self-correct given occasional min A over 2 sessions    Time 11    Period Weeks    Status On-going      SLP LONG TERM GOAL #3   Title Caregiver will appropriate cue patient when apraxic/aphasic errors occurs for 4/5 opportunities over 2 sessions    Time 11    Period Weeks    Status On-going      SLP LONG TERM GOAL #4   Title Pt/daughter will report improved communication effectiveness via PROM by 2 points by last ST session    Time 11    Period Weeks    Status On-going              Plan - 04/29/21 1658     Clinical Impression Statement Lindsey [name] presents for OPST intervention to address 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 Pt is accompanied by her daughter. SLP targeted generation of simple functional phrases to use at home. Occasional apraxic and perseverations errors noted, in which pt benefited from usual SLP modeling and mod to max verbal/visual cues. SLP continued trials of Lingraphica this session, in which pt able to ID icons with 60% accuracy. 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  Skilled ST is recommended to address aphasia and apraxia impacting pt's abilty to communicate effecitvely with both familiar and unfamiliar listeners to convey wants/needs, safety concerns, and participate in social situations.    Speech Therapy Frequency 2x / week    Duration 12 weeks    Treatment/Interventions Compensatory strategies;Patient/family education;Functional tasks;Cueing hierarchy;Environmental controls;Cognitive reorganization;Multimodal  communcation approach;Language facilitation;Compensatory techniques;Internal/external aids;SLP instruction and feedback;Other (comment)    Potential to Achieve Goals Fair    Potential Considerations Severity of impairments;Previous level of function    Consulted and Agree with Plan of Care Patient;Family member/caregiver    Family Member Consulted daughter, Lindsey [name]             Patient will benefit from skilled therapeutic intervention in order to improve the following deficits and impairments:   Verbal apraxia  Aphasia    Problem List Patient Active Problem List   Diagnosis Date Noted  Pain due to onychomycosis of toenails of both feet 11/19/2020   Anemia 05/31/2020   Acute ischemic left MCA stroke (HCC) 04/30/2020   Right hemiplegia (HCC) 04/30/2020   CVA (cerebral vascular accident) (HCC) 04/21/2020   Unspecified atrial fibrillation (HCC) 04/21/2020   Essential hypertension 04/21/2020   Alcohol abuse 04/21/2020    Lindsey [name], Lindsey [name] 04/29/2021, 5:50 PM  [facility] [address] [place], [place], [address] Phone: [phone]   Fax:  [phone]   Name: Lindsey [name] MRN: [id number] Date of Birth: [birth year]

## 2021-05-04 ENCOUNTER — Encounter: Payer: Self-pay | Admitting: Occupational Therapy

## 2021-05-04 ENCOUNTER — Ambulatory Visit: Payer: Medicare Other | Attending: Physical Medicine and Rehabilitation | Admitting: Occupational Therapy

## 2021-05-04 ENCOUNTER — Ambulatory Visit: Payer: Medicare Other

## 2021-05-04 ENCOUNTER — Other Ambulatory Visit: Payer: Self-pay

## 2021-05-04 DIAGNOSIS — R278 Other lack of coordination: Secondary | ICD-10-CM | POA: Diagnosis present

## 2021-05-04 DIAGNOSIS — R41844 Frontal lobe and executive function deficit: Secondary | ICD-10-CM | POA: Insufficient documentation

## 2021-05-04 DIAGNOSIS — R41840 Attention and concentration deficit: Secondary | ICD-10-CM | POA: Diagnosis present

## 2021-05-04 DIAGNOSIS — R41842 Visuospatial deficit: Secondary | ICD-10-CM | POA: Insufficient documentation

## 2021-05-04 DIAGNOSIS — R2681 Unsteadiness on feet: Secondary | ICD-10-CM | POA: Diagnosis present

## 2021-05-04 DIAGNOSIS — I69851 Hemiplegia and hemiparesis following other cerebrovascular disease affecting right dominant side: Secondary | ICD-10-CM | POA: Insufficient documentation

## 2021-05-04 DIAGNOSIS — R482 Apraxia: Secondary | ICD-10-CM | POA: Insufficient documentation

## 2021-05-04 DIAGNOSIS — R4701 Aphasia: Secondary | ICD-10-CM | POA: Diagnosis present

## 2021-05-04 DIAGNOSIS — R2689 Other abnormalities of gait and mobility: Secondary | ICD-10-CM | POA: Insufficient documentation

## 2021-05-04 DIAGNOSIS — M25611 Stiffness of right shoulder, not elsewhere classified: Secondary | ICD-10-CM | POA: Diagnosis present

## 2021-05-04 DIAGNOSIS — M6281 Muscle weakness (generalized): Secondary | ICD-10-CM | POA: Diagnosis present

## 2021-05-04 NOTE — Therapy (Signed)
[facility] [address] [place], [place], [address] Phone: [phone]   Fax:  [phone]  Speech Language Pathology Treatment  Patient Details  Name: Lindsey [name] MRN: [id number] Date of Birth: 1956-07-26 Referring Provider (SLP): Carlis Abbott Drema Pry, MD   Encounter Date: 05/04/2021   End of Session - 05/04/21 1626     Visit Number 4    Number of Visits 25    Date for SLP Re-Evaluation 06/24/21    Authorization Type Medicare    SLP Start Time 1532    SLP Stop Time  1615    SLP Time Calculation (min) 43 min    Activity Tolerance Patient tolerated treatment well             Past Medical History:  Diagnosis Date   Diverticulosis    Hypertension    Uterine prolapse     History reviewed. No pertinent surgical history.  There were no vitals filed for this visit.   Subjective Assessment - 05/04/21 1534     Subjective "doing good"    Currently in Pain? No/denies                   ADULT SLP TREATMENT - 05/04/21 1534       General Information   Behavior/Cognition Alert;Cooperative;Pleasant mood      Treatment Provided   Treatment provided Cognitive-Linquistic      Cognitive-Linquistic Treatment   Treatment focused on Aphasia;Apraxia;Patient/family/caregiver education    Skilled Treatment SLP targeted use of Lingraphica this session. With patient participation, SLP modified food page to have favorite restaurants in first two rows. Pt able to verbalize restaurant given verbal device model with 50% accuracy. Accuracy improved with slow rate and SLP min to mod visual cues. In field of 10, pt able to ID icon given verbal prompt from SLP with 40% accuracy independently. Given verbal cue for location, accuracy improved to 70%. SLP targeted needs category, in which pt able to verbalize needs given verbal device model with 90% accuracy. In field of 5, pt able to ID icon given verbal prompt with 80%  accuracy. SLP utilized cued slow rate, syllable segmentation, visual model, and minimal pairs to aid verbal production, which was effective. SLP to continue to trial Lingraphica in upcoming ST sessions.      Assessment / Recommendations / Plan   Plan Continue with current plan of care      Progression Toward Goals   Progression toward goals Progressing toward goals              SLP Education - 05/04/21 1604     Education Details Lingraphica trials, slow rate, syllable segmentation    Person(s) Educated Patient    Methods Explanation;Demonstration;Handout    Comprehension Verbalized understanding;Returned demonstration;Need further instruction              SLP Short Term Goals - 05/04/21 1626       SLP SHORT TERM GOAL #1   Title Pt will ID paraphasias/perseverations and attempt to correct errors with pause and retrial for 4/5 opportunities given occasional mod A over 2 sessions    Baseline 05-04-21    Time 2    Period Weeks    Status On-going      SLP SHORT TERM GOAL #2   Title Given verbal or visual prompt, pt will correctly ID written word/object/icon in field of 4-6 to communicate wants/needs with 75% accuracy given occasional mod A over 2 sessions  Baseline 05-04-21    Time 2    Period Weeks    Status On-going      SLP SHORT TERM GOAL #3   Title Pt will use multimodal/AAC to ID biographical information and personal interests given 2-6 choices with occasional mod A over 2 sessions    Time 2    Period Weeks    Status On-going      SLP SHORT TERM GOAL #4   Title Pt will verbally approximate 3 significant words for 5/10 trials given occasional fading to rare mod A over 2 sessions    Time 2    Period Weeks    Status On-going              SLP Long Term Goals - 05/04/21 1627       SLP LONG TERM GOAL #1   Title Pt will spontaneously use multimodal communication system for 4/5 opportunities to increase ability to express wants/needs given >2 choices over 2  sessions    Time 10    Period Weeks    Status On-going      SLP LONG TERM GOAL #2   Title Pt will ID 80% of paraphasias/perseverations in a session and attempt to self-correct given occasional min A over 2 sessions    Time 10    Period Weeks    Status On-going      SLP LONG TERM GOAL #3   Title Caregiver will appropriate cue patient when apraxic/aphasic errors occurs for 4/5 opportunities over 2 sessions    Time 10    Period Weeks    Status On-going      SLP LONG TERM GOAL #4   Title Pt/daughter will report improved communication effectiveness via PROM by 2 points by last ST session    Time 10    Period Weeks    Status On-going              Plan - 05/04/21 1627     Clinical Impression Statement Lindsey [name] presents for OPST intervention to address 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 SLP continued trials of Lingraphica this session, in which pt able to ID icons 5-10 with average of 60% accuracy. Pt benefited from verbal and visual modeling, cued slow rate, and syllable segmentation to aid verbal output. 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  Skilled ST is recommended to address aphasia and apraxia impacting pt's abilty to communicate effecitvely with both familiar and unfamiliar listeners to convey wants/needs, safety concerns, and participate in social situations.    Speech Therapy Frequency 2x / week    Duration 12 weeks    Treatment/Interventions Compensatory strategies;Patient/family education;Functional tasks;Cueing hierarchy;Environmental controls;Cognitive reorganization;Multimodal communcation approach;Language facilitation;Compensatory techniques;Internal/external aids;SLP instruction and feedback;Other (comment)    Potential to Achieve Goals Fair    Potential Considerations Severity of impairments;Previous level of function    Consulted and Agree with Plan of  Care Patient             Patient will benefit from skilled therapeutic intervention in order to improve the following deficits and impairments:   Verbal apraxia  Aphasia    Problem List Patient Active Problem List   Diagnosis Date Noted   Pain due to onychomycosis of toenails of both feet 11/19/2020   Anemia 05/31/2020   Acute ischemic left MCA stroke (HCC) 04/30/2020   Right hemiplegia (HCC) 04/30/2020   CVA (cerebral vascular accident) (HCC) 04/21/2020  Unspecified atrial fibrillation (HCC) 04/21/2020   Essential hypertension 04/21/2020   Alcohol abuse 04/21/2020    Janann Colonel, MA CCC-SLP 05/04/2021, 4:29 PM  [facility] [address] [place], [place], [address] Phone: [phone]   Fax:  [phone]   Name: Lindsey [name] MRN: [id number] Date of Birth: 02/14/1956

## 2021-05-04 NOTE — Therapy (Signed)
[facility] [address] [place], [place], [address] Phone: [phone]   Fax:  [phone]  Occupational Therapy Treatment  Patient Details  Name: Maylen Waltermire MRN: [id number] Date of Birth: [birth year] Referring Provider (OT): Sula Soda, MD   Encounter Date: 05/04/2021   OT End of Session - 05/04/21 1822     Visit Number 3    Number of Visits 24    Date for OT Re-Evaluation 06/24/21    Authorization Type Medicare/ Medicaid    Authorization Time Period Pt has used all M[facility] visits    Authorization - Visit Number 3    Authorization - Number of Visits 10    Progress Note Due on Visit 10    OT Start Time 1615    OT Stop Time 1700    OT Time Calculation (min) 45 min    Activity Tolerance Patient tolerated treatment well    Behavior During Therapy [facility] for tasks assessed/performed             Past Medical History:  Diagnosis Date   Diverticulosis    Hypertension    Uterine prolapse     History reviewed. No pertinent surgical history.  There were no vitals filed for this visit.   Subjective Assessment - 05/04/21 1816     Subjective  No pain    Patient is accompanied by: Family member    Pertinent History PMH significant for HTN, EtOH abuse, diverticulosis, CVA, July 2021, Botox June 2021    Limitations Fall Risk. Not Driving. Aphasia. Sensory deficits RUE. Apraxia.    Patient Stated Goals trying to get better    Currently in Pain? No/denies    Pain Score 0-No pain                          OT Treatments/Exercises (OP) - 05/04/21 0001       Neurological Re-education Exercises   Other Exercises 1 Neuromuscular reeducation to address alignement and improve range of motion in [facility] joint with gentle body on arm motions rolling toward right side.  Patient with excessive stiffness throughout RUE - difficult to discern any GH joint motion initially, but with gentle movement onto right arm,  passive range imroved.  Patient responded well to slow gentle self controlled stretching to RUE.  Able to follow with guided elbow flex/extension in humeral dependent position, then with humerus at ~ 80* flexion while supine.  Patient uses abdominal muscles and breath holding with every attempt to move RUE.  All movement attempts are initiated with GH extension.  With cueing and facilitation, patient able to show slight change in initiation pattern.                      OT Short Term Goals - 05/04/21 1823       OT SHORT TERM GOAL #1   Title Pt and caregiver will be independent with updated HEP    Baseline not issued yet    Time 4    Period Weeks    Status New      OT SHORT TERM GOAL #2   Title Pt will donn pants with min A    Time 4    Period Weeks    Status New      OT SHORT TERM GOAL #3   Title Pt will consistently demonstrate 30* active elbow flexion  in prep for  functional use.    Baseline  trace elbow flexion    Time 4    Period Weeks    Status New      OT SHORT TERM GOAL #4   Title Pt consistently demonstrate 30 wrist extension in prep for functional use of RUE.    Baseline trace wrist extension    Time 4    Period Weeks    Status New      OT SHORT TERM GOAL #5   Title Pt will bathe her UB with min A    Baseline mod A    Time 4    Period Weeks    Status New      OT SHORT TERM GOAL #6   Title Pt will demonstrate 90* passive shoulder flexion without pain    Baseline 75*    Time 4    Period Weeks    Status New      OT SHORT TERM GOAL #7   Title I with adapted equipment/ adapted strategies for ADLS(ie rocker knife)    Baseline dependent    Time 4    Period Weeks    Status New               OT Long Term Goals - 05/04/21 1824       OT LONG TERM GOAL #1   Title Pt and caregiver will be independent with updated HEP 06/24/21    Baseline not issued    Time 12    Period Weeks    Status New      OT LONG TERM GOAL #2   Title Pt will  demonstrate ability to stand and pull up her pants with close supervision.    Baseline min A for standing, mod A for LB dressing    Time 12    Period Weeks    Status New      OT LONG TERM GOAL #3   Title Pt will use RUE as a stabilizer/ gross assist consistently at least 75% of the time during ADLS with min prompts    Baseline uses grossly 50% as stabilizer    Time 12    Period Weeks    Status New      OT LONG TERM GOAL #4   Title Pt will perform LB bathing with min A    Baseline mod A    Time 12    Period Weeks    Status New      OT LONG TERM GOAL #5   Title Pt will perform simple snack or beverage prep with close supervision.    Baseline not currently performing    Time 12    Period Weeks    Status New                   Plan - 05/04/21 1822     Clinical Impression Statement Pt with improved passive range following exercise this session    OT Occupational Profile and History Problem Focused Assessment - Including review of records relating to presenting problem    Occupational performance deficits (Please refer to evaluation for details): ADL's;IADL's;Leisure    Body Structure / Function / Physical Skills ADL;Decreased knowledge of use of DME;Strength;Gait;GMC;Tone;Pain;Dexterity;Balance;UE functional use;Proprioception;ROM;IADL;Vision;Sensation;Coordination;FMC;Flexibility;Decreased knowledge of precautions    Cognitive Skills Attention;Energy/Drive;Perception;Understand;Sequencing;Problem Solve;Memory;Safety Awareness;Thought    Rehab Potential Good    Clinical Decision Making Limited treatment options, no task modification necessary    Comorbidities Affecting Occupational Performance: None    Modification or Assistance to Complete Evaluation  No modification  of tasks or assist necessary to complete eval    OT Frequency 2x / week   POC written for 12 weeks to account for scheduling issues and missed visits, anticipate d/c after 8 weeks   OT Duration 12 weeks     OT Treatment/Interventions Self-care/ADL training;Moist Heat;DME and/or AE instruction;Balance training;Aquatic Therapy;Therapeutic activities;Cognitive remediation/compensation;Therapeutic exercise;Visual/perceptual remediation/compensation;Passive range of motion;Neuromuscular education;Functional Mobility Training;Cryotherapy;Electrical Stimulation;Energy conservation;Manual Therapy;Patient/family education;Ultrasound;Paraffin;Fluidtherapy;Splinting    Plan HEP for RUE as able- pt has aphasia, ADL strategies    OT Home Exercise Plan Self-PROM seated shoulder    Recommended Other Services Pt receiving ST and PT    Consulted and Agree with Plan of Care Patient;Family member/caregiver    Family Member Consulted daughter             Patient will benefit from skilled therapeutic intervention in order to improve the following deficits and impairments:   Body Structure / Function / Physical Skills: ADL, Decreased knowledge of use of DME, Strength, Gait, GMC, Tone, Pain, Dexterity, Balance, UE functional use, Proprioception, ROM, IADL, Vision, Sensation, Coordination, FMC, Flexibility, Decreased knowledge of precautions Cognitive Skills: Attention, Energy/Drive, Perception, Understand, Sequencing, Problem Solve, Memory, Safety Awareness, Thought     Visit Diagnosis: 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)  Other lack of coordination  Stiffness of right shoulder, not elsewhere classified  Visuospatial deficit  Muscle weakness (generalized)  Unsteadiness on feet  Attention and concentration deficit    Problem List Patient Active Problem List   Diagnosis Date Noted   Pain due to onychomycosis of toenails of both feet 11/19/2020   Anemia 05/31/2020   Acute ischemic left MCA stroke (HCC) 04/30/2020   Right hemiplegia (HCC) 04/30/2020   CVA (cerebral vascular accident) (HCC) 04/21/2020   Unspecified atrial fibrillation  (HCC) 04/21/2020   Essential hypertension 04/21/2020   Alcohol abuse 04/21/2020    Collier Salina, OTR/L 05/04/2021, 6:24 PM  [facility] [address] [place], [place], [address] Phone: [phone]   Fax:  [phone]  Name: Khloey Chern MRN: [id number] Date of Birth: [birth year]

## 2021-05-05 ENCOUNTER — Ambulatory Visit: Payer: Medicare Other

## 2021-05-05 ENCOUNTER — Ambulatory Visit: Payer: Medicare Other | Admitting: Occupational Therapy

## 2021-05-11 ENCOUNTER — Ambulatory Visit: Payer: Medicare Other

## 2021-05-11 ENCOUNTER — Other Ambulatory Visit: Payer: Self-pay

## 2021-05-11 ENCOUNTER — Encounter: Payer: Self-pay | Admitting: Occupational Therapy

## 2021-05-11 ENCOUNTER — Ambulatory Visit: Payer: Medicare Other | Admitting: Occupational Therapy

## 2021-05-11 DIAGNOSIS — R4701 Aphasia: Secondary | ICD-10-CM

## 2021-05-11 DIAGNOSIS — I69851 Hemiplegia and hemiparesis following other cerebrovascular disease affecting right dominant side: Secondary | ICD-10-CM

## 2021-05-11 DIAGNOSIS — M6281 Muscle weakness (generalized): Secondary | ICD-10-CM

## 2021-05-11 DIAGNOSIS — R2681 Unsteadiness on feet: Secondary | ICD-10-CM

## 2021-05-11 DIAGNOSIS — R278 Other lack of coordination: Secondary | ICD-10-CM

## 2021-05-11 DIAGNOSIS — R2689 Other abnormalities of gait and mobility: Secondary | ICD-10-CM

## 2021-05-11 DIAGNOSIS — R41844 Frontal lobe and executive function deficit: Secondary | ICD-10-CM

## 2021-05-11 DIAGNOSIS — R482 Apraxia: Secondary | ICD-10-CM

## 2021-05-11 DIAGNOSIS — M25611 Stiffness of right shoulder, not elsewhere classified: Secondary | ICD-10-CM

## 2021-05-11 DIAGNOSIS — R41842 Visuospatial deficit: Secondary | ICD-10-CM

## 2021-05-11 DIAGNOSIS — R41840 Attention and concentration deficit: Secondary | ICD-10-CM

## 2021-05-11 NOTE — Therapy (Signed)
[facility] [address] [place], [place], [address] Phone: [phone]   Fax:  [phone]  Speech Language Pathology Treatment  Patient Details  Name: Lindsey [name] MRN: [id number] Date of Birth: [birth year] Referring Provider (SLP): Carlis Abbott Drema Pry, MD   Encounter Date: 05/11/2021   End of Session - 05/11/21 1544     Visit Number 5    Number of Visits 25    Date for SLP Re-Evaluation 06/24/21    Authorization Type Medicare    SLP Start Time 1450    SLP Stop Time  1530    SLP Time Calculation (min) 40 min    Activity Tolerance Patient tolerated treatment well             Past Medical History:  Diagnosis Date   Diverticulosis    Hypertension    Uterine prolapse     History reviewed. No pertinent surgical history.  There were no vitals filed for this visit.   Subjective Assessment - 05/11/21 1450     Subjective "okay"    Currently in Pain? No/denies                   ADULT SLP TREATMENT - 05/11/21 1449       General Information   Behavior/Cognition Alert;Cooperative;Pleasant mood      Treatment Provided   Treatment provided Cognitive-Linquistic      Cognitive-Linquistic Treatment   Treatment focused on Aphasia;Apraxia;Patient/family/caregiver education    Skilled Treatment Pt completed HEP to fill in sentences completions. Pt unable to read sentences aloud to SLP secondary to usual neologisms. Pt able to provide verbal response with 50% accuracy when SLP verbally presented sentence starter. Usual perseverations noted, with SLP providing min to mod A to correct perseverations. SLP trialed Lingraphica device for categories of stores, colors, and letters. Pt exhibited significant difficulty identifying stores upon verbal prompt due to reading impairment. SLP targeted identifying letters to assess base level reading, in which pt able to ID letters with 57% accuracy given choice of 10. When  verbally prompted, pt identified colors with 90% accuracy given choice of 10. Pt able to ID color icon with 20% accuracy when prompted to ID specific colors in environment.      Assessment / Recommendations / Plan   Plan Continue with current plan of care      Progression Toward Goals   Progression toward goals Progressing toward goals              SLP Education - 05/11/21 1544     Education Details Lingraphica trials, HEp    Person(s) Educated Patient    Methods Explanation;Demonstration;Handout;Verbal cues    Comprehension Verbalized understanding;Returned demonstration;Verbal cues required;Need further instruction              SLP Short Term Goals - 05/11/21 1544       SLP SHORT TERM GOAL #1   Title Pt will ID paraphasias/perseverations and attempt to correct errors with pause and retrial for 4/5 opportunities given occasional mod A over 2 sessions    Baseline 05-04-21    Time 1    Period Weeks    Status On-going      SLP SHORT TERM GOAL #2   Title Given verbal or visual prompt, pt will correctly ID written word/object/icon in field of 4-6 to communicate wants/needs with 75% accuracy given occasional mod A over 2 sessions    Baseline 05-04-21    Time 1    Period  Weeks    Status On-going      SLP SHORT TERM GOAL #3   Title Pt will use multimodal/AAC to ID biographical information and personal interests given 2-6 choices with occasional mod A over 2 sessions    Time 1    Period Weeks    Status On-going      SLP SHORT TERM GOAL #4   Title Pt will verbally approximate 3 significant words for 5/10 trials given occasional fading to rare mod A over 2 sessions    Time 1    Period Weeks    Status On-going              SLP Long Term Goals - 05/11/21 1545       SLP LONG TERM GOAL #1   Title Pt will spontaneously use multimodal communication system for 4/5 opportunities to increase ability to express wants/needs given >2 choices over 2 sessions    Time 9     Period Weeks    Status On-going      SLP LONG TERM GOAL #2   Title Pt will ID 80% of paraphasias/perseverations in a session and attempt to self-correct given occasional min A over 2 sessions    Time 9    Period Weeks    Status On-going      SLP LONG TERM GOAL #3   Title Caregiver will appropriate cue patient when apraxic/aphasic errors occurs for 4/5 opportunities over 2 sessions    Time 9    Period Weeks    Status On-going      SLP LONG TERM GOAL #4   Title Pt/daughter will report improved communication effectiveness via PROM by 2 points by last ST session    Time 9    Period Weeks    Status On-going              Plan - 05/11/21 1545     Clinical Impression Statement Lindsey [name] presents for OPST intervention to address 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 SLP continued trials of Lingraphica this session, in which pt able to ID icons with picture basis versus written with improved accuracy. Pt benefited from verbal and visual modeling, cued slow rate, and syllable segmentation to aid verbal output. 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  Skilled ST is recommended to address aphasia and apraxia impacting pt's abilty to communicate effecitvely with both familiar and unfamiliar listeners to convey wants/needs, safety concerns, and participate in social situations.    Speech Therapy Frequency 2x / week    Duration 12 weeks    Treatment/Interventions Compensatory strategies;Patient/family education;Functional tasks;Cueing hierarchy;Environmental controls;Cognitive reorganization;Multimodal communcation approach;Language facilitation;Compensatory techniques;Internal/external aids;SLP instruction and feedback;Other (comment)    Potential to Achieve Goals Fair    Potential Considerations Severity of impairments;Previous level of function    SLP Home Exercise Plan provided     Consulted and Agree with Plan of Care Patient             Patient will benefit from skilled therapeutic intervention in order to improve the following deficits and impairments:   Verbal apraxia  Aphasia    Problem List Patient Active Problem List   Diagnosis Date Noted   Pain due to onychomycosis of toenails of both feet 11/19/2020   Anemia 05/31/2020   Acute ischemic left MCA stroke (HCC) 04/30/2020   Right hemiplegia (HCC) 04/30/2020   CVA (cerebral vascular accident) (HCC) 04/21/2020  Unspecified atrial fibrillation (HCC) 04/21/2020   Essential hypertension 04/21/2020   Alcohol abuse 04/21/2020    Janann Colonel, MA CCC-SLP 05/11/2021, 3:47 PM  [facility] [address] [place], [place], [address] Phone: [phone]   Fax:  [phone]   Name: Lindsey [name] MRN: [id number] Date of Birth: 1956/04/09

## 2021-05-11 NOTE — Therapy (Signed)
[facility] [address] [place], [place], [address] Phone: [phone]   Fax:  [phone]  Occupational Therapy Treatment  Patient Details  Name: Lindsey [name] MRN: [id number] Date of Birth: May 08, 1956 Referring Provider (OT): Sula Soda, MD   Encounter Date: 05/11/2021   OT End of Session - 05/11/21 1949     Visit Number 4    Number of Visits 24    Date for OT Re-Evaluation 06/24/21    Authorization Type Medicare/ Medicaid    Authorization Time Period Pt has used all MCD visits    Authorization - Visit Number 4    Authorization - Number of Visits 10    Progress Note Due on Visit 10    OT Start Time 1615    OT Stop Time 1655    OT Time Calculation (min) 40 min    Activity Tolerance Patient tolerated treatment well    Behavior During Therapy [facility] for tasks assessed/performed             Past Medical History:  Diagnosis Date   Diverticulosis    Hypertension    Uterine prolapse     History reviewed. No pertinent surgical history.  There were no vitals filed for this visit.   Subjective Assessment - 05/11/21 1945     Subjective  Better    Patient is accompanied by: Family member    Pertinent History PMH significant for HTN, EtOH abuse, diverticulosis, CVA, July 2021, Botox June 2021    Limitations Fall Risk. Not Driving. Aphasia. Sensory deficits RUE. Apraxia.    Patient Stated Goals trying to get better    Currently in Pain? No/denies    Pain Score 0-No pain                          OT Treatments/Exercises (OP) - 05/11/21 1946       ADLs   LB Dressing Worked with patient on doffing and donning sock and shoe on left foot.  Patient reports unable to don AFO and shoe on RLE, and difficulty getting pants over RLE.      Neurological Re-education Exercises   Other Exercises 1 Neuromuscular reeducation to address RUE stiffness, and initiation of trace movement at elbow joint.   Patient had difficulty with active assisted shoulder flexion with elbow extension, but was able to extend elbow with elbow flexion on moveable surface.    Other Exercises 2 Worked on active weight bearing during transition from sit to supine, and supine to sit.  Patient with excessive stiffness in RLE, making it challenging for her to independently transition this leg onto or off of surface.                      OT Short Term Goals - 05/11/21 1950       OT SHORT TERM GOAL #1   Title Pt and caregiver will be independent with updated HEP    Baseline not issued yet    Time 4    Period Weeks    Status On-going      OT SHORT TERM GOAL #2   Title Pt will donn pants with min A    Time 4    Period Weeks    Status On-going      OT SHORT TERM GOAL #3   Title Pt will consistently demonstrate 30* active elbow flexion  in prep for  functional use.  Baseline trace elbow flexion    Time 4    Period Weeks    Status On-going      OT SHORT TERM GOAL #4   Title Pt consistently demonstrate 30 wrist extension in prep for functional use of RUE.    Baseline trace wrist extension    Time 4    Period Weeks    Status On-going      OT SHORT TERM GOAL #5   Title Pt will bathe her UB with min A    Baseline mod A    Time 4    Period Weeks    Status On-going      OT SHORT TERM GOAL #6   Title Pt will demonstrate 90* passive shoulder flexion without pain    Baseline 75*    Time 4    Period Weeks    Status On-going      OT SHORT TERM GOAL #7   Title I with adapted equipment/ adapted strategies for ADLS(ie rocker knife)    Baseline dependent    Time 4    Period Weeks    Status On-going               OT Long Term Goals - 05/11/21 1951       OT LONG TERM GOAL #1   Title Pt and caregiver will be independent with updated HEP 06/24/21    Baseline not issued    Time 12    Period Weeks    Status On-going      OT LONG TERM GOAL #2   Title Pt will demonstrate ability to  stand and pull up her pants with close supervision.    Baseline min A for standing, mod A for LB dressing    Time 12    Period Weeks    Status On-going      OT LONG TERM GOAL #3   Title Pt will use RUE as a stabilizer/ gross assist consistently at least 75% of the time during ADLS with min prompts    Baseline uses grossly 50% as stabilizer    Time 12    Period Weeks    Status On-going      OT LONG TERM GOAL #4   Title Pt will perform LB bathing with min A    Baseline mod A    Time 12    Period Weeks    Status On-going      OT LONG TERM GOAL #5   Title Pt will perform simple snack or beverage prep with close supervision.    Baseline not currently performing    Time 12    Period Weeks    Status On-going                   Plan - 05/11/21 1950     Clinical Impression Statement Pt with trace active shoulder and elbow movement today following passive range and alignmnet of body segments.    OT Occupational Profile and History Problem Focused Assessment - Including review of records relating to presenting problem    Occupational performance deficits (Please refer to evaluation for details): ADL's;IADL's;Leisure    Body Structure / Function / Physical Skills ADL;Decreased knowledge of use of DME;Strength;Gait;GMC;Tone;Pain;Dexterity;Balance;UE functional use;Proprioception;ROM;IADL;Vision;Sensation;Coordination;FMC;Flexibility;Decreased knowledge of precautions    Cognitive Skills Attention;Energy/Drive;Perception;Understand;Sequencing;Problem Solve;Memory;Safety Awareness;Thought    Rehab Potential Good    Clinical Decision Making Limited treatment options, no task modification necessary    Comorbidities Affecting Occupational Performance: None  Modification or Assistance to Complete Evaluation  No modification of tasks or assist necessary to complete eval    OT Frequency 2x / week   POC written for 12 weeks to account for scheduling issues and missed visits, anticipate  d/c after 8 weeks   OT Duration 12 weeks    OT Treatment/Interventions Self-care/ADL training;Moist Heat;DME and/or AE instruction;Balance training;Aquatic Therapy;Therapeutic activities;Cognitive remediation/compensation;Therapeutic exercise;Visual/perceptual remediation/compensation;Passive range of motion;Neuromuscular education;Functional Mobility Training;Cryotherapy;Electrical Stimulation;Energy conservation;Manual Therapy;Patient/family education;Ultrasound;Paraffin;Fluidtherapy;Splinting    Plan HEP for RUE as able- pt has aphasia, ADL strategies    OT Home Exercise Plan Self-PROM seated shoulder    Recommended Other Services Pt receiving ST and PT    Consulted and Agree with Plan of Care Patient;Family member/caregiver    Family Member Consulted daughter             Patient will benefit from skilled therapeutic intervention in order to improve the following deficits and impairments:   Body Structure / Function / Physical Skills: ADL, Decreased knowledge of use of DME, Strength, Gait, GMC, Tone, Pain, Dexterity, Balance, UE functional use, Proprioception, ROM, IADL, Vision, Sensation, Coordination, FMC, Flexibility, Decreased knowledge of precautions Cognitive Skills: Attention, Energy/Drive, Perception, Understand, Sequencing, Problem Solve, Memory, Safety Awareness, Thought     Visit Diagnosis: 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)  Other lack of coordination  Stiffness of right shoulder, not elsewhere classified  Visuospatial deficit  Muscle weakness (generalized)  Unsteadiness on feet  Attention and concentration deficit  Frontal lobe and executive function deficit    Problem List Patient Active Problem List   Diagnosis Date Noted   Pain due to onychomycosis of toenails of both feet 11/19/2020   Anemia 05/31/2020   Acute ischemic left MCA stroke (HCC) 04/30/2020   Right hemiplegia (HCC)  04/30/2020   CVA (cerebral vascular accident) (HCC) 04/21/2020   Unspecified atrial fibrillation (HCC) 04/21/2020   Essential hypertension 04/21/2020   Alcohol abuse 04/21/2020    Collier Salina 05/11/2021, 7:52 PM  [facility] [facility] [address] [place], [place], [address] Phone: [phone]   Fax:  [phone]  Name: Kennah Hehr MRN: [id number] Date of Birth: Feb 14, 1956

## 2021-05-12 NOTE — Therapy (Signed)
[facility] [address] [place], [place], [address] Phone: [phone]   Fax:  [phone]  Physical Therapy Treatment  Patient Details  Name: Lindsey [name] MRN: [id number] Date of Birth: 12/13/1955 Referring Provider (PT): Horton Chin, MD   Encounter Date: 05/11/2021   PT End of Session - 05/11/21 1535     Visit Number 3    Number of Visits 17    Date for PT Re-Evaluation 06/04/21    Authorization Type MCR/MCD    Progress Note Due on Visit 10    PT Start Time 1530    PT Stop Time 1615    PT Time Calculation (min) 45 min    Behavior During Therapy [facility] for tasks assessed/performed             Past Medical History:  Diagnosis Date   Diverticulosis    Hypertension    Uterine prolapse     History reviewed. No pertinent surgical history.  There were no vitals filed for this visit.   Subjective Assessment - 05/11/21 1535     Subjective no falls to report has been walking every other day    Patient is accompained by: Family member    Pertinent History HTN, Alcohol Abuse    Limitations Standing;Walking    How long can you sit comfortably? unlimited    How long can you stand comfortably? <5 min    How long can you walk comfortably? <34min    Patient Stated Goals Be able to walk              05/11/21 0001  Balance Exercises: Standing  Stepping Strategy Anterior;UE support;10 reps;Limitations;Lateral  Stepping Strategy Limitations fwd and lateral stepping using hemiwalker support, 10x ea, direction  Step Ups Forward;2 inch;UE support 1;Limitations  Step Ups Limitations standing with hemiwalker, stepping onto 2' block 10x requiring MinA to flex R hip       05/11/21 0001  Transfers  Transfers Sit to Stand;Stand to Sit  Sit to Stand 4: Min guard;4: Min assist  Sit to Stand Details Tactile cues for weight shifting;Tactile cues for placement;Verbal cues for sequencing;Verbal cues for  technique;Verbal cues for safe use of DME/AE  Sit to Stand Details (indicate cue type and reason) VCs to lean fwd before standing  Stand to Sit 4: Min guard  Stand to Sit Details (indicate cue type and reason) Verbal cues for precautions/safety;Verbal cues for technique;Verbal cues for sequencing  Stand to Sit Details uncontrolled descent  Comments 10 reps  Ambulation/Gait  Ambulation/Gait Yes  Ambulation/Gait Assistance 4: Min guard  Ambulation/Gait Assistance Details hemiwalker/gait belt  Ambulation Distance (Feet) 115 Feet  Assistive device Hemi-walker  Gait Pattern Step-to pattern;Step-through pattern;Decreased arm swing - right;Decreased stride length;Decreased hip/knee flexion - right  Ambulation Surface Level;Indoor  Stairs Yes  Stairs Assistance 4: Min assist  Stair Management Technique One rail Left;Step to pattern;Forwards  Number of Stairs 4  Height of Stairs 6  Gait Comments required assist to place RLE onto ste due to ihp flexion weakness, 2 bouts of 125ft ambulation  Knee/Hip Exercises: Stretches  Passive Hamstring Stretch Right;1 rep;Limitations  Passive Hamstring Stretch Limitations 2 min hold                            PT Short Term Goals - 05/11/21 0803       PT SHORT TERM GOAL #1   Title Patient to demo I in initial  HEP    Baseline TBD; 04/19/21 Reviewed and added to today.    Time 4    Period Weeks    Status New    Target Date 05/07/21      PT SHORT TERM GOAL #2   Title Patient will be able to ambulate with HW and SBA x 230 ft to demonstrate improved household mobility    Baseline 168ft with RW and CGA/MinA; 05/11/21 Ambulation of 160ftx2 with hemiwalker and CGA    Time 4    Period Weeks    Status Revised    Target Date 05/07/21      PT SHORT TERM GOAL #3   Title Patient to transfer to all positions with CGA    Baseline MnA/CGA for STS transfer from [facility]    Time 4    Period Weeks    Status New    Target Date 05/07/21      PT  SHORT TERM GOAL #4   Title Patient to negotiate 4 steps with L rail with MinA and step to pattern    Baseline TBD    Time 4    Period Days    Status New    Target Date 05/07/21      PT SHORT TERM GOAL #5   Title Perform DGI and set goal    Baseline TBD    Time 4    Period Weeks    Status New    Target Date 05/07/21      PT SHORT TERM GOAL #6   Title assess gait velocity and set goal    Baseline TBD    Time 4    Period Days    Status New    Target Date 05/07/21               PT Long Term Goals - 04/02/21 0820       PT LONG TERM GOAL #1   Title Patient will be independent with final HEP and walking program within the home with caregiver assistance if needed    Baseline TBD based on previous HEP    Time 8   th visit   Period Weeks    Status New    Target Date 06/04/21      PT LONG TERM GOAL #2   Title Patient to perform all transfers with SBA( except floor transfers)    Baseline CGA/MinA for STS transfers    Time 8    Period Weeks    Status New    Target Date 06/04/21      PT LONG TERM GOAL #3   Title Patient to ambulate 528ft with RW, R AFO with SBA    Baseline 166ft in clinic with RW and R AFO using CGA.MinA    Time 8    Period Weeks    Status New    Target Date 06/04/21      PT LONG TERM GOAL #4   Title Assess progress towards gait speed    Baseline TBD    Time 8    Period Weeks    Status New    Target Date 06/04/21      PT LONG TERM GOAL #5   Title Assess progress towards DGI    Baseline TBD    Time 8    Period Weeks    Status New    Target Date 06/04/21                   Plan -  05/11/21 0755     Clinical Impression Statement Todays session focused on gait with single UE device, hemiwalker, in anticipaton of advancing to a QC.  Trial of stair negotiation with patient unable to place RLE onto step w/o assist due to hip flexor weakness. Continues to require cues to [facility] forward with STS transitions.    Personal Factors and  Comorbidities Comorbidity 2    Comorbidities HTN, Alcohol Abuse, Diverticulosis    Examination-Activity Limitations Bed Mobility;Bathing;Sit;Transfers;Dressing;Stairs;Stand;Locomotion Level;Toileting    Examination-Participation Restrictions Occupation;Community Activity;Driving    Stability/Clinical Decision Making Evolving/Moderate complexity    Rehab Potential Good    PT Frequency 2x / week    PT Duration 8 weeks   1 week - last authorized Medicaid visit   PT Treatment/Interventions ADLs/Self Care Home Management;Electrical Stimulation;DME Instruction;Gait training;Stair training;Therapeutic activities;Functional mobility training;Therapeutic exercise;Balance training;Neuromuscular re-education;Patient/family education;Orthotic Fit/Training;Wheelchair mobility training;Manual techniques;Passive range of motion    PT Next Visit Plan HEP review, DGI, R hip flexion strengthening    PT Home Exercise Plan Access Code: [email or web address]    Consulted and Agree with Plan of Care Patient;Family member/caregiver    Family Member Consulted Daughter             Patient will benefit from skilled therapeutic intervention in order to improve the following deficits and impairments:  Abnormal gait, Decreased balance, Decreased mobility, Decreased endurance, Difficulty walking, Impaired sensation, Impaired UE functional use, Decreased strength, Decreased safety awareness, Decreased knowledge of use of DME, Decreased activity tolerance, Decreased range of motion, Impaired tone, Improper body mechanics  Visit Diagnosis: Unsteadiness on feet  Aphasia  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)  Other abnormalities of gait and mobility     Problem List Patient Active Problem List   Diagnosis Date Noted   Pain due to onychomycosis of toenails of both feet 11/19/2020   Anemia 05/31/2020   Acute ischemic left MCA stroke (HCC) 04/30/2020   Right  hemiplegia (HCC) 04/30/2020   CVA (cerebral vascular accident) (HCC) 04/21/2020   Unspecified atrial fibrillation (HCC) 04/21/2020   Essential hypertension 04/21/2020   Alcohol abuse 04/21/2020    Farris Has Graycen Degan PT 05/12/2021, 8:05 AM  [place] [facility] [address] [place], [place], [address] Phone: [phone]   Fax:  [phone]  Name: Lindsey [name] MRN: [id number] Date of Birth: Apr 16, 1956

## 2021-05-13 ENCOUNTER — Ambulatory Visit: Payer: Medicare Other

## 2021-05-13 ENCOUNTER — Ambulatory Visit: Payer: Medicare Other | Admitting: Occupational Therapy

## 2021-05-13 ENCOUNTER — Other Ambulatory Visit: Payer: Self-pay

## 2021-05-13 DIAGNOSIS — R2681 Unsteadiness on feet: Secondary | ICD-10-CM

## 2021-05-13 DIAGNOSIS — R41840 Attention and concentration deficit: Secondary | ICD-10-CM

## 2021-05-13 DIAGNOSIS — R4701 Aphasia: Secondary | ICD-10-CM

## 2021-05-13 DIAGNOSIS — R41844 Frontal lobe and executive function deficit: Secondary | ICD-10-CM

## 2021-05-13 DIAGNOSIS — M25611 Stiffness of right shoulder, not elsewhere classified: Secondary | ICD-10-CM

## 2021-05-13 DIAGNOSIS — M6281 Muscle weakness (generalized): Secondary | ICD-10-CM

## 2021-05-13 DIAGNOSIS — I69851 Hemiplegia and hemiparesis following other cerebrovascular disease affecting right dominant side: Secondary | ICD-10-CM | POA: Diagnosis not present

## 2021-05-13 DIAGNOSIS — R278 Other lack of coordination: Secondary | ICD-10-CM

## 2021-05-13 DIAGNOSIS — R482 Apraxia: Secondary | ICD-10-CM

## 2021-05-13 DIAGNOSIS — R2689 Other abnormalities of gait and mobility: Secondary | ICD-10-CM

## 2021-05-13 DIAGNOSIS — R41842 Visuospatial deficit: Secondary | ICD-10-CM

## 2021-05-13 NOTE — Therapy (Signed)
[facility] [address] [place], [place], [address] Phone: [phone]   Fax:  [phone]  Physical Therapy Treatment  Patient Details  Name: Lindsey [name] MRN: [id number] Date of Birth: [birth year] Referring Provider (PT): Horton Chin, MD   Encounter Date: 05/13/2021    Past Medical History:  Diagnosis Date   Diverticulosis    Hypertension    Uterine prolapse     History reviewed. No pertinent surgical history.  There were no vitals filed for this visit.   Subjective Assessment - 05/13/21 1620     Subjective No chanes to report, no falls or LOB noted    Patient is accompained by: Family member    Pertinent History HTN, Alcohol Abuse    Limitations Standing;Walking    How long can you sit comfortably? unlimited    How long can you stand comfortably? <5 min    How long can you walk comfortably? <75min    Patient Stated Goals Be able to walk    Pain Onset More than a month ago                               [facility] Adult PT Treatment/Exercise - 05/13/21 0001       Transfers   Transfers Sit to Stand    Sit to Stand 4: Min guard;4: Min assist    Sit to Stand Details Tactile cues for weight shifting;Tactile cues for placement;Verbal cues for sequencing;Verbal cues for technique;Verbal cues for safe use of DME/AE    Stand to Sit 4: Min guard    Stand to Sit Details (indicate cue type and reason) Verbal cues for precautions/safety;Verbal cues for technique;Verbal cues for sequencing    Stand to Sit Details VCs needed to control descent    Comments 10 reps      Ambulation/Gait   Ambulation/Gait Yes    Ambulation/Gait Assistance 4: Min guard    Ambulation Distance (Feet) 115 Feet    Assistive device Hemi-walker    Gait Pattern Step-to pattern;Step-through pattern;Decreased arm swing - right;Decreased stride length;Decreased hip/knee flexion - right    Ambulation Surface Level;Indoor       Knee/Hip Exercises: Stretches   Passive Hamstring Stretch Right;1 rep;Limitations    Passive Hamstring Stretch Limitations 2 min hold      Knee/Hip Exercises: Seated   Long Arc Quad Strengthening;15 reps;Limitations;Left;2 sets    Long Arc Quad Limitations tactile assist    Heel Slides Strengthening;Limitations;Right;2 sets;10 reps    Heel Slides Limitations towel for friction    Marching Strengthening;Right;2 sets;10 reps;Limitations    Marching Limitations LUE on foam roll to stabilize, 2x10 with tactile asist                 Balance Exercises - 05/13/21 0001       Balance Exercises: Standing   Stepping Strategy Anterior;UE support;10 reps;Limitations;Lateral    Stepping Strategy Limitations floor pebble, 10x2 ea. LE with hemiwalker support, ant and lat directions                 PT Short Term Goals - 05/11/21 0803       PT SHORT TERM GOAL #1   Title Patient to demo I in initial HEP    Baseline TBD; 04/19/21 Reviewed and added to today.    Time 4    Period Weeks    Status New    Target Date 05/07/21  PT SHORT TERM GOAL #2   Title Patient will be able to ambulate with HW and SBA x 230 ft to demonstrate improved household mobility    Baseline 118ft with RW and CGA/MinA; 05/11/21 Ambulation of 11ftx2 with hemiwalker and CGA    Time 4    Period Weeks    Status Revised    Target Date 05/07/21      PT SHORT TERM GOAL #3   Title Patient to transfer to all positions with CGA    Baseline MnA/CGA for STS transfer from [facility]    Time 4    Period Weeks    Status New    Target Date 05/07/21      PT SHORT TERM GOAL #4   Title Patient to negotiate 4 steps with L rail with MinA and step to pattern    Baseline TBD    Time 4    Period Days    Status New    Target Date 05/07/21      PT SHORT TERM GOAL #5   Title Perform DGI and set goal    Baseline TBD    Time 4    Period Weeks    Status New    Target Date 05/07/21      PT SHORT TERM GOAL #6   Title  assess gait velocity and set goal    Baseline TBD    Time 4    Period Days    Status New    Target Date 05/07/21               PT Long Term Goals - 04/02/21 0820       PT LONG TERM GOAL #1   Title Patient will be independent with final HEP and walking program within the home with caregiver assistance if needed    Baseline TBD based on previous HEP    Time 8   th visit   Period Weeks    Status New    Target Date 06/04/21      PT LONG TERM GOAL #2   Title Patient to perform all transfers with SBA( except floor transfers)    Baseline CGA/MinA for STS transfers    Time 8    Period Weeks    Status New    Target Date 06/04/21      PT LONG TERM GOAL #3   Title Patient to ambulate 565ft with RW, R AFO with SBA    Baseline 174ft in clinic with RW and R AFO using CGA.MinA    Time 8    Period Weeks    Status New    Target Date 06/04/21      PT LONG TERM GOAL #4   Title Assess progress towards gait speed    Baseline TBD    Time 8    Period Weeks    Status New    Target Date 06/04/21      PT LONG TERM GOAL #5   Title Assess progress towards DGI    Baseline TBD    Time 8    Period Weeks    Status New    Target Date 06/04/21                   Plan - 05/13/21 1824     Clinical Impression Statement Todays session continued to focus on RLE function, transfer techniques and ambulation with HW, showing improved hip flexion as evidence by ability to touch top of floor pebbles with  several attempts, transferring with less cuing but still needing VCs to WS fwd.  Postural cues needed while ambulating.    Personal Factors and Comorbidities Comorbidity 2    Comorbidities HTN, Alcohol Abuse, Diverticulosis    Examination-Activity Limitations Bed Mobility;Bathing;Sit;Transfers;Dressing;Stairs;Stand;Locomotion Level;Toileting    Examination-Participation Restrictions Occupation;Community Activity;Driving    Stability/Clinical Decision Making Evolving/Moderate complexity     Rehab Potential Good    PT Frequency 2x / week    PT Duration 8 weeks   1 week - last authorized Medicaid visit   PT Treatment/Interventions ADLs/Self Care Home Management;Electrical Stimulation;DME Instruction;Gait training;Stair training;Therapeutic activities;Functional mobility training;Therapeutic exercise;Balance training;Neuromuscular re-education;Patient/family education;Orthotic Fit/Training;Wheelchair mobility training;Manual techniques;Passive range of motion    PT Next Visit Plan HEP review, DGI, R hip flexion strengthening and stepping tasks    PT Home Exercise Plan Access Code: [email or web address]    Consulted and Agree with Plan of Care Patient;Family member/caregiver    Family Member Consulted Daughter             Patient will benefit from skilled therapeutic intervention in order to improve the following deficits and impairments:  Abnormal gait, Decreased balance, Decreased mobility, Decreased endurance, Difficulty walking, Impaired sensation, Impaired UE functional use, Decreased strength, Decreased safety awareness, Decreased knowledge of use of DME, Decreased activity tolerance, Decreased range of motion, Impaired tone, Improper body mechanics  Visit Diagnosis: Unsteadiness on feet  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)  Other lack of coordination     Problem List Patient Active Problem List   Diagnosis Date Noted   Pain due to onychomycosis of toenails of both feet 11/19/2020   Anemia 05/31/2020   Acute ischemic left MCA stroke (HCC) 04/30/2020   Right hemiplegia (HCC) 04/30/2020   CVA (cerebral vascular accident) (HCC) 04/21/2020   Unspecified atrial fibrillation (HCC) 04/21/2020   Essential hypertension 04/21/2020   Alcohol abuse 04/21/2020    Hildred Laser PT 05/13/2021, 6:33 PM  [facility] [address] [place], [place],  [address] Phone: [phone]   Fax:  [phone]  Name: Lindsey [name] MRN: [id number] Date of Birth: 28-Jan-1956

## 2021-05-13 NOTE — Therapy (Signed)
[facility] [address] [place], [place], [address] Phone: [phone]   Fax:  [phone]  Speech Language Pathology Treatment  Patient Details  Name: Lindsey [name] MRN: [id number] Date of Birth: [birth year] Referring Provider (SLP): Carlis Abbott Drema Pry, MD   Encounter Date: 05/13/2021   End of Session - 05/13/21 1444     Visit Number 6    Number of Visits 25    Date for SLP Re-Evaluation 06/24/21    Authorization Type Medicare    SLP Start Time 1445    SLP Stop Time  1530    SLP Time Calculation (min) 45 min    Activity Tolerance Patient tolerated treatment well             Past Medical History:  Diagnosis Date   Diverticulosis    Hypertension    Uterine prolapse     No past surgical history on file.  There were no vitals filed for this visit.   Subjective Assessment - 05/13/21 1608     Subjective "nothing"    Patient is accompained by: Family member   grandson, [name]   Currently in Pain? No/denies                   ADULT SLP TREATMENT - 05/13/21 1444       General Information   Behavior/Cognition Alert;Cooperative;Pleasant mood      Treatment Provided   Treatment provided Cognitive-Linquistic      Cognitive-Linquistic Treatment   Treatment focused on Aphasia;Apraxia;Patient/family/caregiver education    Skilled Treatment SLP reviewed HEP, in which pt's grandson provided assistance to name pictures. Usual phonemic paraphasias, apraxic errors, and distortions exhibited. Pt benefited from consistent SLP modeling and usual initial phonemic cues due to perseverations. Given verbal prompt, pt able to ID correct picture with 78% accuracy given choice of 9. SLP recommended family provide verbal prompt and pt ID pictures as part of HEP. SLP conducted trial of Lingraphica for fruits. Pt able to ID fruit based on verbal prompt with 40% accuracy given choice of 10. Pt demonstrated increasing  awareness of errors with usual attempts to self-correct.      Assessment / Recommendations / Plan   Plan Continue with current plan of care      Progression Toward Goals   Progression toward goals Progressing toward goals              SLP Education - 05/13/21 1515     Education Details Lingraphica trials, naming HEP    Person(s) Educated Patient;Caregiver(s)    Methods Explanation;Demonstration;Handout    Comprehension Verbalized understanding;Returned demonstration;Need further instruction              SLP Short Term Goals - 05/13/21 1444       SLP SHORT TERM GOAL #1   Title Pt will ID paraphasias/perseverations and attempt to correct errors with pause and retrial for 4/5 opportunities given occasional mod A over 2 sessions    Baseline 05-04-21    Time 1    Period Weeks    Status On-going      SLP SHORT TERM GOAL #2   Title Given verbal or visual prompt, pt will correctly ID written word/object/icon in field of 4-6 to communicate wants/needs with 75% accuracy given occasional mod A over 2 sessions    Baseline 05-04-21    Time 1    Period Weeks    Status On-going      SLP SHORT TERM GOAL #3  Title Pt will use multimodal/AAC to ID biographical information and personal interests given 2-6 choices with occasional mod A over 2 sessions    Time 1    Period Weeks    Status On-going      SLP SHORT TERM GOAL #4   Title Pt will verbally approximate 3 significant words for 5/10 trials given occasional fading to rare mod A over 2 sessions    Time 1    Period Weeks    Status On-going              SLP Long Term Goals - 05/13/21 1445       SLP LONG TERM GOAL #1   Title Pt will spontaneously use multimodal communication system for 4/5 opportunities to increase ability to express wants/needs given >2 choices over 2 sessions    Time 9    Period Weeks    Status On-going      SLP LONG TERM GOAL #2   Title Pt will ID 80% of paraphasias/perseverations in a session and  attempt to self-correct given occasional min A over 2 sessions    Time 9    Period Weeks    Status On-going      SLP LONG TERM GOAL #3   Title Caregiver will appropriate cue patient when apraxic/aphasic errors occurs for 4/5 opportunities over 2 sessions    Time 9    Period Weeks    Status On-going      SLP LONG TERM GOAL #4   Title Pt/daughter will report improved communication effectiveness via PROM by 2 points by last ST session    Time 9    Period Weeks    Status On-going              Plan - 05/13/21 1445     Clinical Impression Statement Lindsey [name] presents for OPST intervention to address 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 SLP continued trials of Lingraphica this session, in which pt able to ID icons with picture basis versus written with improved accuracy. Pt benefited from verbal and visual modeling, cued slow rate, and syllable segmentation to aid verbal output. 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  Skilled ST is recommended to address aphasia and apraxia impacting pt's abilty to communicate effecitvely with both familiar and unfamiliar listeners to convey wants/needs, safety concerns, and participate in social situations.    Speech Therapy Frequency 2x / week    Duration 12 weeks    Treatment/Interventions Compensatory strategies;Patient/family education;Functional tasks;Cueing hierarchy;Environmental controls;Cognitive reorganization;Multimodal communcation approach;Language facilitation;Compensatory techniques;Internal/external aids;SLP instruction and feedback;Other (comment)    Potential to Achieve Goals Fair    Potential Considerations Severity of impairments;Previous level of function    SLP Home Exercise Plan provided    Consulted and Agree with Plan of Care Patient;Family member/caregiver    Family Member Consulted grandson, [name]              Patient will benefit from skilled therapeutic intervention in order to improve the following deficits and impairments:   Aphasia  Verbal apraxia    Problem List Patient Active Problem List   Diagnosis Date Noted   Pain due to onychomycosis of toenails of both feet 11/19/2020   Anemia 05/31/2020   Acute ischemic left MCA stroke (HCC) 04/30/2020   Right hemiplegia (HCC) 04/30/2020   CVA (cerebral vascular accident) (HCC) 04/21/2020   Unspecified atrial fibrillation (HCC) 04/21/2020   Essential  hypertension 04/21/2020   Alcohol abuse 04/21/2020    Janann Colonel, Lindsey CCC-SLP 05/13/2021, 4:09 PM  [facility] [address] [place], [place], [address] Phone: [phone]   Fax:  [phone]   Name: Lindsey [name] MRN: [id number] Date of Birth: [birth year]

## 2021-05-13 NOTE — Therapy (Signed)
[facility] [place] [address] [place], [place], [address] Phone: [phone]   Fax:  [phone]  Occupational Therapy Treatment  Patient Details  Name: Lindsey [name] MRN: [id number] Date of Birth: 1956-06-30 Referring Provider (OT): Leeroy Cha, MD   Encounter Date: 05/13/2021   OT End of Session - 05/13/21 1623     Visit Number 5    Number of Visits 24    Date for OT Re-Evaluation 06/24/21    Authorization Type Medicare/ Medicaid    Authorization Time Period Pt has used all MCD visits    Authorization - Visit Number 5    Authorization - Number of Visits 10    Progress Note Due on Visit 10    OT Start Time 1534    OT Stop Time 1613    OT Time Calculation (min) 39 min    Activity Tolerance Patient tolerated treatment well    Behavior During Therapy [facility] for tasks assessed/performed             Past Medical History:  Diagnosis Date   Diverticulosis    Hypertension    Uterine prolapse     No past surgical history on file.  There were no vitals filed for this visit.   Pt denies pain today Treatment: Supine passive ROM to RUE with joint mobs and shoulder abduction, elbow extension, followed by sidelying on RUE  wirh passive elbow flexion/ extension and supination/ pronation. Seated weightbearing through R elbow on mat then over ball, with body on arm movements and mod facilitation  AA/ROM shoulder extension/ elbow flexion, pt demo trace movement. Donning shorts in seated min A for sit to stand to pull up. Pt then doffed shorts with supervision. Discussed briefly the possibility of aquatic therapy with pt/ grandson, will need further discussion with pt's dtr.                        OT Short Term Goals - 05/13/21 1624       OT SHORT TERM GOAL #1   Title Pt and caregiver will be independent with updated HEP    Baseline not issued yet    Time 4    Period Weeks    Status On-going       OT SHORT TERM GOAL #2   Title Pt will donn pants with min A    Time 4    Period Weeks    Status Achieved   met for short in the clinic, pt performed with min A for balance     OT SHORT TERM GOAL #3   Title Pt will consistently demonstrate 30* active elbow flexion  in prep for  functional use.    Baseline trace elbow flexion    Time 4    Period Weeks    Status On-going      OT SHORT TERM GOAL #4   Title Pt consistently demonstrate 30 wrist extension in prep for functional use of RUE.    Baseline trace wrist extension    Time 4    Period Weeks    Status On-going      OT SHORT TERM GOAL #5   Title Pt will bathe her UB with min A    Baseline mod A    Time 4    Period Weeks    Status On-going      OT SHORT TERM GOAL #6   Title Pt will demonstrate 90* passive shoulder flexion without  pain    Baseline 75*    Time 4    Period Weeks    Status On-going      OT SHORT TERM GOAL #7   Title I with adapted equipment/ adapted strategies for ADLS(ie rocker knife)    Baseline dependent    Time 4    Period Weeks    Status On-going               OT Long Term Goals - 05/11/21 1951       OT LONG TERM GOAL #1   Title Pt and caregiver will be independent with updated HEP 06/24/21    Baseline not issued    Time 12    Period Weeks    Status On-going      OT LONG TERM GOAL #2   Title Pt will demonstrate ability to stand and pull up her pants with close supervision.    Baseline min A for standing, mod A for LB dressing    Time 12    Period Weeks    Status On-going      OT LONG TERM GOAL #3   Title Pt will use RUE as a stabilizer/ gross assist consistently at least 75% of the time during ADLS with min prompts    Baseline uses grossly 50% as stabilizer    Time 12    Period Weeks    Status On-going      OT LONG TERM GOAL #4   Title Pt will perform LB bathing with min A    Baseline mod A    Time 12    Period Weeks    Status On-going      OT LONG TERM GOAL #5   Title  Pt will perform simple snack or beverage prep with close supervision.    Baseline not currently performing    Time 12    Period Weeks    Status On-going                   Plan - 05/13/21 1621     Clinical Impression Statement Pt with continued  trace active shoulder and elbow movement today following passive range. Pt remains limited by spasticity and she may benefit from aquatic therapy in the future.    OT Occupational Profile and History Problem Focused Assessment - Including review of records relating to presenting problem    Occupational performance deficits (Please refer to evaluation for details): ADL's;IADL's;Leisure    Body Structure / Function / Physical Skills ADL;Decreased knowledge of use of DME;Strength;Gait;GMC;Tone;Pain;Dexterity;Balance;UE functional use;Proprioception;ROM;IADL;Vision;Sensation;Coordination;FMC;Flexibility;Decreased knowledge of precautions    Cognitive Skills Attention;Energy/Drive;Perception;Understand;Sequencing;Problem Solve;Memory;Safety Awareness;Thought    Rehab Potential Good    Clinical Decision Making Limited treatment options, no task modification necessary    Comorbidities Affecting Occupational Performance: None    Modification or Assistance to Complete Evaluation  No modification of tasks or assist necessary to complete eval    OT Frequency 2x / week   POC written for 12 weeks to account for scheduling issues and missed visits, anticipate d/c after 8 weeks   OT Duration 12 weeks    OT Treatment/Interventions Self-care/ADL training;Moist Heat;DME and/or AE instruction;Balance training;Aquatic Therapy;Therapeutic activities;Cognitive remediation/compensation;Therapeutic exercise;Visual/perceptual remediation/compensation;Passive range of motion;Neuromuscular education;Functional Mobility Training;Cryotherapy;Electrical Stimulation;Energy conservation;Manual Therapy;Patient/family education;Ultrasound;Paraffin;Fluidtherapy;Splinting    Plan  consider aquatic therapy, discuss with pt's family if she is present, continue NMR and ADLs.    OT Home Exercise Plan Self-PROM seated shoulder    Recommended Other Services Pt receiving ST and  PT    Consulted and Agree with Plan of Care Patient;Family member/caregiver    Family Member Consulted grandson             Patient will benefit from skilled therapeutic intervention in order to improve the following deficits and impairments:   Body Structure / Function / Physical Skills: ADL, Decreased knowledge of use of DME, Strength, Gait, GMC, Tone, Pain, Dexterity, Balance, UE functional use, Proprioception, ROM, IADL, Vision, Sensation, Coordination, FMC, Flexibility, Decreased knowledge of precautions Cognitive Skills: Attention, Energy/Drive, Perception, Understand, Sequencing, Problem Solve, Memory, Safety Awareness, Thought     Visit Diagnosis: 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)  Other lack of coordination  Stiffness of right shoulder, not elsewhere classified  Visuospatial deficit  Muscle weakness (generalized)  Attention and concentration deficit  Frontal lobe and executive function deficit  Other abnormalities of gait and mobility    Problem List Patient Active Problem List   Diagnosis Date Noted   Pain due to onychomycosis of toenails of both feet 11/19/2020   Anemia 05/31/2020   Acute ischemic left MCA stroke ([place]) 04/30/2020   Right hemiplegia ([place]) 04/30/2020   CVA (cerebral vascular accident) ([place]) 04/21/2020   Unspecified atrial fibrillation ([place]) 04/21/2020   Essential hypertension 04/21/2020   Alcohol abuse 04/21/2020    Kastiel Simonian 05/13/2021, 4:24 PM  [facility] [address] [place], [place], [address] Phone: [phone]   Fax:  [phone]  Name: Melany Wiesman MRN: [id number] Date of Birth: [birth year]

## 2021-05-18 ENCOUNTER — Ambulatory Visit: Payer: Medicare Other

## 2021-05-18 ENCOUNTER — Other Ambulatory Visit: Payer: Self-pay

## 2021-05-18 ENCOUNTER — Ambulatory Visit: Payer: Medicare Other | Admitting: Occupational Therapy

## 2021-05-18 DIAGNOSIS — I69851 Hemiplegia and hemiparesis following other cerebrovascular disease affecting right dominant side: Secondary | ICD-10-CM

## 2021-05-18 DIAGNOSIS — R4701 Aphasia: Secondary | ICD-10-CM

## 2021-05-18 DIAGNOSIS — R482 Apraxia: Secondary | ICD-10-CM

## 2021-05-18 DIAGNOSIS — R278 Other lack of coordination: Secondary | ICD-10-CM

## 2021-05-18 DIAGNOSIS — R41842 Visuospatial deficit: Secondary | ICD-10-CM

## 2021-05-18 DIAGNOSIS — M25611 Stiffness of right shoulder, not elsewhere classified: Secondary | ICD-10-CM

## 2021-05-18 DIAGNOSIS — M6281 Muscle weakness (generalized): Secondary | ICD-10-CM

## 2021-05-18 DIAGNOSIS — R41840 Attention and concentration deficit: Secondary | ICD-10-CM

## 2021-05-18 NOTE — Therapy (Signed)
[facility] [address] [place], [place], [address] Phone: [phone]   Fax:  [phone]  Occupational Therapy Treatment  Patient Details  Name: Lindsey [name] MRN: [id number] Date of Birth: [birth year] Referring Provider (OT): Leeroy Cha, MD   Encounter Date: 05/18/2021   OT End of Session - 05/18/21 1906     Visit Number 6    Number of Visits 24    Date for OT Re-Evaluation 06/24/21    Authorization Type Medicare/ Medicaid    Authorization Time Period Pt has used all MCD visits    Authorization - Visit Number 6    Authorization - Number of Visits 10    Progress Note Due on Visit 10    OT Start Time 1533    OT Stop Time 1615    OT Time Calculation (min) 42 min    Activity Tolerance Patient tolerated treatment well    Behavior During Therapy [facility] for tasks assessed/performed             Past Medical History:  Diagnosis Date   Diverticulosis    Hypertension    Uterine prolapse     No past surgical history on file.  There were no vitals filed for this visit.   Subjective Assessment - 05/18/21 1538     Subjective  Same - regarding pain - no pain    Pertinent History PMH significant for HTN, EtOH abuse, diverticulosis, CVA, July 2021, Botox June 2021    Limitations Fall Risk. Not Driving. Aphasia. Sensory deficits RUE. Apraxia.    Patient Stated Goals trying to get better    Currently in Pain? No/denies    Pain Score 0-No pain                          OT Treatments/Exercises (OP) - 05/18/21 0001       Neurological Re-education Exercises   Other Exercises 1 Neuromuscular reeducation to address postural control and stiffness in RUE.  Patient does not automatically shift weight onto right hip in sitting, and has excessive muscle activation in upper quadrant - shoulder elevation, scap protraction, elbow extension.  Does well with gentle movements of body, over fixed arm in gradually  increasing degrees of motion at [facility] joint.  Patient able to allow passive elbow flexion today with trunk flexion in sitting.  Then worked on tilted surface - patient able to produce shoulder extension with elbow flexion in small ranges with assist to maintain contact with surface.  Daughter present, and agreeable to aquatic therapy.  Aquatic therapy information not yet issued - next session.                      OT Short Term Goals - 05/18/21 1908       OT SHORT TERM GOAL #1   Title Pt and caregiver will be independent with updated HEP    Baseline not issued yet    Time 4    Period Weeks    Status On-going      OT SHORT TERM GOAL #2   Title Pt will donn pants with min A    Time 4    Period Weeks    Status Achieved   met for short in the clinic, pt performed with min A for balance     OT SHORT TERM GOAL #3   Title Pt will consistently demonstrate 30* active elbow flexion  in prep for  functional use.    Baseline trace elbow flexion    Time 4    Period Weeks    Status On-going      OT SHORT TERM GOAL #4   Title Pt consistently demonstrate 30 wrist extension in prep for functional use of RUE.    Baseline trace wrist extension    Time 4    Period Weeks    Status On-going      OT SHORT TERM GOAL #5   Title Pt will bathe her UB with min A    Baseline mod A    Time 4    Period Weeks    Status On-going      OT SHORT TERM GOAL #6   Title Pt will demonstrate 90* passive shoulder flexion without pain    Baseline 75*    Time 4    Period Weeks    Status On-going      OT SHORT TERM GOAL #7   Title I with adapted equipment/ adapted strategies for ADLS(ie rocker knife)    Baseline dependent    Time 4    Period Weeks    Status On-going               OT Long Term Goals - 05/18/21 1908       OT LONG TERM GOAL #1   Title Pt and caregiver will be independent with updated HEP 06/24/21    Baseline not issued    Time 12    Period Weeks    Status On-going       OT LONG TERM GOAL #2   Title Pt will demonstrate ability to stand and pull up her pants with close supervision.    Baseline min A for standing, mod A for LB dressing    Time 12    Period Weeks    Status On-going      OT LONG TERM GOAL #3   Title Pt will use RUE as a stabilizer/ gross assist consistently at least 75% of the time during ADLS with min prompts    Baseline uses grossly 50% as stabilizer    Time 12    Period Weeks    Status On-going      OT LONG TERM GOAL #4   Title Pt will perform LB bathing with min A    Baseline mod A    Time 12    Period Weeks    Status On-going      OT LONG TERM GOAL #5   Title Pt will perform simple snack or beverage prep with close supervision.    Baseline not currently performing    Time 12    Period Weeks    Status On-going                   Plan - 05/18/21 1907     Clinical Impression Statement Pt responding well to general trunk movement and body over arm movement to reduce excessive muscle activation in RUE    OT Occupational Profile and History Problem Focused Assessment - Including review of records relating to presenting problem    Occupational performance deficits (Please refer to evaluation for details): ADL's;IADL's;Leisure    Body Structure / Function / Physical Skills ADL;Decreased knowledge of use of DME;Strength;Gait;GMC;Tone;Pain;Dexterity;Balance;UE functional use;Proprioception;ROM;IADL;Vision;Sensation;Coordination;FMC;Flexibility;Decreased knowledge of precautions    Cognitive Skills Attention;Energy/Drive;Perception;Understand;Sequencing;Problem Solve;Memory;Safety Awareness;Thought    Rehab Potential Good    Clinical Decision Making Limited treatment options, no task modification necessary    Comorbidities  Affecting Occupational Performance: None    Modification or Assistance to Complete Evaluation  No modification of tasks or assist necessary to complete eval    OT Frequency 2x / week   POC written for 12  weeks to account for scheduling issues and missed visits, anticipate d/c after 8 weeks   OT Duration 12 weeks    OT Treatment/Interventions Self-care/ADL training;Moist Heat;DME and/or AE instruction;Balance training;Aquatic Therapy;Therapeutic activities;Cognitive remediation/compensation;Therapeutic exercise;Visual/perceptual remediation/compensation;Passive range of motion;Neuromuscular education;Functional Mobility Training;Cryotherapy;Electrical Stimulation;Energy conservation;Manual Therapy;Patient/family education;Ultrasound;Paraffin;Fluidtherapy;Splinting    Plan ISSUE aquatic therapy instructions and discuss with pt's family if she is present, continue NMR and ADLs.    OT Home Exercise Plan Self-PROM seated shoulder    Recommended Other Services Pt receiving ST and PT    Consulted and Agree with Plan of Care Patient;Family member/caregiver    Family Member Consulted grandson             Patient will benefit from skilled therapeutic intervention in order to improve the following deficits and impairments:   Body Structure / Function / Physical Skills: ADL, Decreased knowledge of use of DME, Strength, Gait, GMC, Tone, Pain, Dexterity, Balance, UE functional use, Proprioception, ROM, IADL, Vision, Sensation, Coordination, FMC, Flexibility, Decreased knowledge of precautions Cognitive Skills: Attention, Energy/Drive, Perception, Understand, Sequencing, Problem Solve, Memory, Safety Awareness, Thought     Visit Diagnosis: 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)  Other lack of coordination  Stiffness of right shoulder, not elsewhere classified  Visuospatial deficit  Muscle weakness (generalized)  Attention and concentration deficit    Problem List Patient Active Problem List   Diagnosis Date Noted   Pain due to onychomycosis of toenails of both feet 11/19/2020   Anemia 05/31/2020   Acute ischemic left MCA stroke  ([place]) 04/30/2020   Right hemiplegia ([place]) 04/30/2020   CVA (cerebral vascular accident) ([place]) 04/21/2020   Unspecified atrial fibrillation ([place]) 04/21/2020   Essential hypertension 04/21/2020   Alcohol abuse 04/21/2020    Mariah Milling 05/18/2021, 7:09 PM  [facility] [address] [place], [place], [address] Phone: [phone]   Fax:  [phone]  Name: Lindsey [name] MRN: [id number] Date of Birth: Feb 12, 1956

## 2021-05-18 NOTE — Therapy (Signed)
[facility] [address] [place], [place], [address] Phone: [phone]   Fax:  [phone]  Speech Language Pathology Treatment  Patient Details  Name: Lindsey [name] MRN: [id number] Date of Birth: 04-04-1956 Referring Provider (SLP): Carlis Abbott Drema Pry, MD   Encounter Date: 05/18/2021   End of Session - 05/18/21 1443     Visit Number 7    Number of Visits 25    Date for SLP Re-Evaluation 06/24/21    Authorization Type Medicare    SLP Start Time 1445    SLP Stop Time  1530    SLP Time Calculation (min) 45 min    Activity Tolerance Patient tolerated treatment well             Past Medical History:  Diagnosis Date   Diverticulosis    Hypertension    Uterine prolapse     History reviewed. No pertinent surgical history.  There were no vitals filed for this visit.   Subjective Assessment - 05/18/21 1448     Subjective "a little better" re: speech    Patient is accompained by: --    Currently in Pain? No/denies                   ADULT SLP TREATMENT - 05/18/21 1441       General Information   Behavior/Cognition Alert;Cooperative;Pleasant mood      Treatment Provided   Treatment provided Cognitive-Linquistic      Cognitive-Linquistic Treatment   Treatment focused on Aphasia;Apraxia;Patient/family/caregiver education    Skilled Treatment Pt forgot HEP at home this session. SLP targeted trials of Lingraphica device. SLP modified page for "I want" to include starter phrase and SLP created personalized choices. Given verbal prompt, pt able to ID starter phrase and correct icon with 80% accuracy given choice of 5. Given choice of 7, pt was 57% accurate which improved to 100% accuracy with secondary repetition. Some verbal perseveration noted (ex: blanket for jacket) requring max SLP assitance to reduce perseverations and correct. Less frequent SLP verbal cues needed to verbally name common items this  session given Lingraphica modeling. SLP recommended patient investigate device and practice selecting and verbalizing after modeling, in which pt able to complete independently. SLP to request for Lingraphica trial as pt expresses desire to use device and is demonstrating increasing comprehension of how to use device.      Assessment / Recommendations / Plan   Plan Continue with current plan of care      Progression Toward Goals   Progression toward goals Progressing toward goals              SLP Education - 05/18/21 1939     Education Details Lingraphica trials, naming HEP    Person(s) Educated Patient    Methods Explanation;Demonstration;Handout;Verbal cues    Comprehension Verbalized understanding;Returned demonstration;Need further instruction              SLP Short Term Goals - 05/18/21 1444       SLP SHORT TERM GOAL #1   Title Pt will ID paraphasias/perseverations and attempt to correct errors with pause and retrial for 4/5 opportunities given occasional mod A over 2 sessions    Baseline 05-04-21    Time 1    Period Weeks    Status On-going      SLP SHORT TERM GOAL #2   Title Given verbal or visual prompt, pt will correctly ID written word/object/icon in field of 4-6 to communicate wants/needs  with 75% accuracy given occasional mod A over 2 sessions    Baseline 05-04-21, 05-18-21    Time --    Period --    Status Achieved      SLP SHORT TERM GOAL #3   Title Pt will use multimodal/AAC to ID biographical information and personal interests given 2-6 choices with occasional mod A over 2 sessions    Baseline 05-18-21    Time 1    Period Weeks    Status On-going      SLP SHORT TERM GOAL #4   Title Pt will verbally approximate 3 significant words for 5/10 trials given occasional fading to rare mod A over 2 sessions    Time 1    Period Weeks    Status On-going              SLP Long Term Goals - 05/18/21 1444       SLP LONG TERM GOAL #1   Title Pt will  spontaneously use multimodal communication system for 4/5 opportunities to increase ability to express wants/needs given >2 choices over 2 sessions    Time 8    Period Weeks    Status On-going      SLP LONG TERM GOAL #2   Title Pt will ID 80% of paraphasias/perseverations in a session and attempt to self-correct given occasional min A over 2 sessions    Time 8    Period Weeks    Status On-going      SLP LONG TERM GOAL #3   Title Caregiver will appropriate cue patient when apraxic/aphasic errors occurs for 4/5 opportunities over 2 sessions    Time 8    Period Weeks    Status On-going      SLP LONG TERM GOAL #4   Title Pt/daughter will report improved communication effectiveness via PROM by 2 points by last ST session    Time 8    Period Weeks    Status On-going              Plan - 05/18/21 1939     Clinical Impression Statement Shanyce presents for OPST intervention to address 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 SLP continued trials of Lingraphica this session, in which pt able to ID icons for needs with 63% accuracy, which improved to 100% accuracy with secondary repetition. Increased verbal output exhibited at phrase level with use of device. Pt benefited from usual verbal and occasional visual cues to aid articulation and some intermittent syllable segmentation to aid verbal output. 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 SLP to request Lingraphica device trial. Skilled ST is recommended to address aphasia and apraxia impacting pt's abilty to communicate effecitvely with both familiar and unfamiliar listeners to convey wants/needs, safety concerns, and participate in social situations.    Speech Therapy Frequency 2x / week    Duration 12 weeks    Treatment/Interventions Compensatory strategies;Patient/family education;Functional tasks;Cueing hierarchy;Environmental  controls;Cognitive reorganization;Multimodal communcation approach;Language facilitation;Compensatory techniques;Internal/external aids;SLP instruction and feedback;Other (comment)    Potential to Achieve Goals Fair    Potential Considerations Severity of impairments;Previous level of function    SLP Home Exercise Plan provided    Consulted and Agree with Plan of Care Patient             Patient will benefit from skilled therapeutic intervention in order to improve the following deficits and impairments:   Aphasia  Verbal  apraxia    Problem List Patient Active Problem List   Diagnosis Date Noted   Pain due to onychomycosis of toenails of both feet 11/19/2020   Anemia 05/31/2020   Acute ischemic left MCA stroke (HCC) 04/30/2020   Right hemiplegia (HCC) 04/30/2020   CVA (cerebral vascular accident) (HCC) 04/21/2020   Unspecified atrial fibrillation (HCC) 04/21/2020   Essential hypertension 04/21/2020   Alcohol abuse 04/21/2020    Janann Colonel, MA CCC-SLP 05/18/2021, 7:52 PM  [facility] [address] [place], [place], [address] Phone: [phone]   Fax:  [phone]   Name: Meisha Salone MRN: [id number] Date of Birth: April 15, 1956

## 2021-05-20 ENCOUNTER — Other Ambulatory Visit: Payer: Self-pay

## 2021-05-20 ENCOUNTER — Encounter: Payer: Self-pay | Admitting: Occupational Therapy

## 2021-05-20 ENCOUNTER — Ambulatory Visit: Payer: Medicare Other | Admitting: Occupational Therapy

## 2021-05-20 ENCOUNTER — Ambulatory Visit: Payer: Medicare Other

## 2021-05-20 DIAGNOSIS — R2689 Other abnormalities of gait and mobility: Secondary | ICD-10-CM

## 2021-05-20 DIAGNOSIS — I69851 Hemiplegia and hemiparesis following other cerebrovascular disease affecting right dominant side: Secondary | ICD-10-CM

## 2021-05-20 DIAGNOSIS — R2681 Unsteadiness on feet: Secondary | ICD-10-CM

## 2021-05-20 DIAGNOSIS — M25611 Stiffness of right shoulder, not elsewhere classified: Secondary | ICD-10-CM

## 2021-05-20 DIAGNOSIS — M6281 Muscle weakness (generalized): Secondary | ICD-10-CM

## 2021-05-20 DIAGNOSIS — R482 Apraxia: Secondary | ICD-10-CM

## 2021-05-20 DIAGNOSIS — R4701 Aphasia: Secondary | ICD-10-CM

## 2021-05-20 DIAGNOSIS — R41844 Frontal lobe and executive function deficit: Secondary | ICD-10-CM

## 2021-05-20 DIAGNOSIS — R41840 Attention and concentration deficit: Secondary | ICD-10-CM

## 2021-05-20 DIAGNOSIS — R41842 Visuospatial deficit: Secondary | ICD-10-CM

## 2021-05-20 NOTE — Therapy (Signed)
[facility] [address] [place], [place], [address] Phone: [phone]   Fax:  [phone]  Occupational Therapy Treatment  Patient Details  Name: Lindsey [name] MRN: [id number] Date of Birth: 26-Apr-1956 Referring Provider (OT): Leeroy Cha, MD   Encounter Date: 05/20/2021   OT End of Session - 05/20/21 1715     Visit Number 7    Number of Visits 24    Date for OT Re-Evaluation 06/24/21    Authorization Type Medicare/ Medicaid    Authorization Time Period Pt has used all MCD visits    Authorization - Visit Number 7    Authorization - Number of Visits 10    Progress Note Due on Visit 10    OT Start Time 1615    OT Stop Time 1700    OT Time Calculation (min) 45 min    Activity Tolerance Patient limited by pain    Behavior During Therapy --   tearful            Past Medical History:  Diagnosis Date   Diverticulosis    Hypertension    Uterine prolapse     History reviewed. No pertinent surgical history.  There were no vitals filed for this visit.   Subjective Assessment - 05/20/21 1711     Subjective  Patient with frown on face, making limited eye contact.  When asked what was wrong - "nothing"    Pertinent History PMH significant for HTN, EtOH abuse, diverticulosis, CVA, July 2021, Botox June 2021    Limitations Fall Risk. Not Driving. Aphasia. Sensory deficits RUE. Apraxia.    Currently in Pain? No/denies    Pain Score 0-No pain                          OT Treatments/Exercises (OP) - 05/20/21 1712       Neurological Re-education Exercises   Other Exercises 1 Attempted to do slight weight bearing on right hand/forearm in standing.  Patient uncomfortable standing with weight shifted toward right.  Patient transitioned to sitting, but had difficulty aligning distal RUE for weight acceptance.  Patient reported pain in right arm, tearful - rubbing forearm to hand.  Heat applied x 3  min - soothing voice, massage of upper traps, patient reports pain resolved.  Gentle carpal mobilization.      Splinting   Splinting Initiated fabrication for custom wrist splint for daytime use.  Patient with strong pull in wrist into flexion, ulnar deviation, and now with occasional reports of pain.  Will finalize splint next session.                      OT Short Term Goals - 05/20/21 1716       OT SHORT TERM GOAL #1   Title Pt and caregiver will be independent with updated HEP    Baseline not issued yet    Time 4    Period Weeks    Status On-going      OT SHORT TERM GOAL #2   Title Pt will donn pants with min A    Time 4    Period Weeks    Status Achieved   met for short in the clinic, pt performed with min A for balance     OT SHORT TERM GOAL #3   Title Pt will consistently demonstrate 30* active elbow flexion  in prep for  functional use.    Baseline  trace elbow flexion    Time 4    Period Weeks    Status On-going      OT SHORT TERM GOAL #4   Title Pt consistently demonstrate 30 wrist extension in prep for functional use of RUE.    Baseline trace wrist extension    Time 4    Period Weeks    Status On-going      OT SHORT TERM GOAL #5   Title Pt will bathe her UB with min A    Baseline mod A    Time 4    Period Weeks    Status On-going      OT SHORT TERM GOAL #6   Title Pt will demonstrate 90* passive shoulder flexion without pain    Baseline 75*    Time 4    Period Weeks    Status On-going      OT SHORT TERM GOAL #7   Title I with adapted equipment/ adapted strategies for ADLS(ie rocker knife)    Baseline dependent    Time 4    Period Weeks    Status On-going               OT Long Term Goals - 05/20/21 1716       OT LONG TERM GOAL #1   Title Pt and caregiver will be independent with updated HEP 06/24/21    Baseline not issued    Time 12    Period Weeks    Status On-going      OT LONG TERM GOAL #2   Title Pt will demonstrate  ability to stand and pull up her pants with close supervision.    Baseline min A for standing, mod A for LB dressing    Time 12    Period Weeks    Status On-going      OT LONG TERM GOAL #3   Title Pt will use RUE as a stabilizer/ gross assist consistently at least 75% of the time during ADLS with min prompts    Baseline uses grossly 50% as stabilizer    Time 12    Period Weeks    Status On-going      OT LONG TERM GOAL #4   Title Pt will perform LB bathing with min A    Baseline mod A    Time 12    Period Weeks    Status On-going      OT LONG TERM GOAL #5   Title Pt will perform simple snack or beverage prep with close supervision.    Baseline not currently performing    Time 12    Period Weeks    Status On-going                   Plan - 05/20/21 1715     Clinical Impression Statement Pt limited today by tearfulness, and report of pain in right forearm    OT Occupational Profile and History Problem Focused Assessment - Including review of records relating to presenting problem    Occupational performance deficits (Please refer to evaluation for details): ADL's;IADL's;Leisure    Body Structure / Function / Physical Skills ADL;Decreased knowledge of use of DME;Strength;Gait;GMC;Tone;Pain;Dexterity;Balance;UE functional use;Proprioception;ROM;IADL;Vision;Sensation;Coordination;FMC;Flexibility;Decreased knowledge of precautions    Cognitive Skills Attention;Energy/Drive;Perception;Understand;Sequencing;Problem Solve;Memory;Safety Awareness;Thought    Rehab Potential Good    Clinical Decision Making Limited treatment options, no task modification necessary    Comorbidities Affecting Occupational Performance: None    Modification or Assistance to Complete Evaluation  No modification of tasks or assist necessary to complete eval    OT Frequency 2x / week   POC written for 12 weeks to account for scheduling issues and missed visits, anticipate d/c after 8 weeks   OT Duration  12 weeks    OT Treatment/Interventions Self-care/ADL training;Moist Heat;DME and/or AE instruction;Balance training;Aquatic Therapy;Therapeutic activities;Cognitive remediation/compensation;Therapeutic exercise;Visual/perceptual remediation/compensation;Passive range of motion;Neuromuscular education;Functional Mobility Training;Cryotherapy;Electrical Stimulation;Energy conservation;Manual Therapy;Patient/family education;Ultrasound;Paraffin;Fluidtherapy;Splinting    Plan Finish wrist splint, issue aquatic therapy instructions and discuss with pt's family if she is present, continue NMR and ADLs.    OT Home Exercise Plan Self-PROM seated shoulder    Recommended Other Services Pt receiving ST and PT    Consulted and Agree with Plan of Care Patient             Patient will benefit from skilled therapeutic intervention in order to improve the following deficits and impairments:   Body Structure / Function / Physical Skills: ADL, Decreased knowledge of use of DME, Strength, Gait, GMC, Tone, Pain, Dexterity, Balance, UE functional use, Proprioception, ROM, IADL, Vision, Sensation, Coordination, FMC, Flexibility, Decreased knowledge of precautions Cognitive Skills: Attention, Energy/Drive, Perception, Understand, Sequencing, Problem Solve, Memory, Safety Awareness, Thought     Visit Diagnosis: 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)  Unsteadiness on feet  Muscle weakness (generalized)  Stiffness of right shoulder, not elsewhere classified  Visuospatial deficit  Attention and concentration deficit  Frontal lobe and executive function deficit    Problem List Patient Active Problem List   Diagnosis Date Noted   Pain due to onychomycosis of toenails of both feet 11/19/2020   Anemia 05/31/2020   Acute ischemic left MCA stroke ([place]) 04/30/2020   Right hemiplegia ([place]) 04/30/2020   CVA (cerebral vascular accident) ([place])  04/21/2020   Unspecified atrial fibrillation ([place]) 04/21/2020   Essential hypertension 04/21/2020   Alcohol abuse 04/21/2020    Mariah Milling 05/20/2021, 5:17 PM  [facility] [address] [place], [place], [address] Phone: [phone]   Fax:  [phone]  Name: Lindsey [name] MRN: [id number] Date of Birth: 08-22-1956

## 2021-05-20 NOTE — Therapy (Signed)
[place] [address] [place], [place], [address] Phone: [phone]   Fax:  [phone]  Physical Therapy Treatment  Patient Details  Name: Lindsey [name] MRN: [id number] Date of Birth: 12-25-1955 Referring Provider (PT): Izora Ribas, MD   Encounter Date: 05/20/2021   PT End of Session - 05/20/21 1616     Visit Number 5    Number of Visits 17    Date for PT Re-Evaluation 06/04/21    Authorization Type MCR/MCD    Authorization - Visit Number --    Authorization - Number of Visits --    Progress Note Due on Visit 10    PT Start Time 2025    PT Stop Time 1615    PT Time Calculation (min) 45 min    Equipment Utilized During Treatment Gait belt    Activity Tolerance Patient tolerated treatment well    Behavior During Therapy [facility] for tasks assessed/performed             Past Medical History:  Diagnosis Date   Diverticulosis    Hypertension    Uterine prolapse     History reviewed. No pertinent surgical history.  There were no vitals filed for this visit.   Subjective Assessment - 05/20/21 1534     Subjective No chanes to report, no falls or LOB noted.  Ambulating every other day with family    Patient is accompained by: Family member    Pertinent History HTN, Alcohol Abuse    Limitations Standing;Walking    How long can you sit comfortably? unlimited    How long can you stand comfortably? <5 min    How long can you walk comfortably? <31mn    Patient Stated Goals Be able to walk    Pain Onset More than a month ago                               [facility]Ex[facility] - 05/20/21 0001       Transfers   Transfers Sit to Stand    Sit to Stand 5: Supervision;4: Min guard    Sit to Stand Details Tactile cues for weight shifting;Tactile cues for placement;Verbal cues for sequencing;Verbal cues for technique;Verbal cues for safe use of DME/AE    Stand to Sit 4: Min  guard;5: Supervision    Stand to Sit Details (indicate cue type and reason) Verbal cues for precautions/safety;Verbal cues for technique;Verbal cues for sequencing    Stand to Sit Details VCs needed to WS And control descent    Comments 10 reps      Ambulation/Gait   Ambulation/Gait Yes    Ambulation/Gait Assistance 5: Supervision;4: Min guard    Ambulation Distance (Feet) 115 Feet    Assistive device Hemi-walker    Gait Pattern Step-to pattern;Step-through pattern    Ambulation Surface Level;Indoor    Gait Comments encouraged forard gaze and step through pattern      Knee/Hip Exercises: Stretches   Passive Hamstring Stretch Right;1 rep;Limitations    Passive Hamstring Stretch Limitations 2 min hold      Knee/Hip Exercises: Seated   Long Arc Quad Strengthening;Right;2 sets;15 reps    Long Arc Quad Limitations tactile assist with LUE stabilized on foam roll    Heel Slides AROM;Strengthening;2 sets;15 reps    Heel Slides Limitations towel for friction    Marching Strengthening;Right;2 sets;Limitations;15 reps    Marching Limitations LUE on  foam roll to stabilize, 2x10 with tactile asist                 Balance Exercises - 05/20/21 0001       Balance Exercises: Standing   Stepping Strategy Anterior;UE support;10 reps;Limitations;Lateral    Stepping Strategy Limitations sidestepping and fwd/bwd stepping emphasizing increased step length, 10x per leg in ea. direction    Step Ups Forward;2 inch;UE support 1;Limitations    Step Ups Limitations standing with hemiwalker, stepping onto 2' block 10x requiring MinA to flex R hip                 PT Short Term Goals - 05/20/21 1627       PT SHORT TERM GOAL #1   Title Patient to demo I in initial HEP    Baseline TBD; 04/19/21 Reviewed and added to today.    Time 4    Period Weeks    Status Partially Met    Target Date 05/07/21      PT SHORT TERM GOAL #2   Title Patient will be able to ambulate with HW and SBA x 230 ft  to demonstrate improved household mobility    Baseline 11f with RW and CGA/MinA; 05/11/21 Ambulation of 1166f2 with hemiwalker and CGA    Time 4    Period Weeks    Status Revised    Target Date 05/07/21      PT SHORT TERM GOAL #3   Title Patient to transfer to all positions with CGA    Baseline MnA/CGA [facility] transfer sit/stand with CGA to SBA    Time 4    Period Weeks    Status Partially Met    Target Date 05/07/21      PT SHORT TERM GOAL #4   Title Patient to negotiate 4 steps with L rail with MinA and step to pattern    Baseline TBD; 05/20/21 Patient able to negotiate 4 steps with step to pattern and L rail under SBA    Time 4    Period Days    Status Achieved    Target Date 05/07/21      PT SHORT TERM GOAL #5   Title Perform DGI and set goal    Baseline TBD    Time 4    Period Weeks    Status New    Target Date 05/07/21      PT SHORT TERM GOAL #6   Title assess gait velocity and set goal    Baseline TBD    Time 4    Period Days    Status New    Target Date 05/07/21               PT Long Term Goals - 04/02/21 0820       PT LONG TERM GOAL #1   Title Patient will be independent with final HEP and walking program within the home with caregiver assistance if needed    Baseline TBD based on previous HEP    Time 8   th visit   Period Weeks    Status New    Target Date 06/04/21      PT LONG TERM GOAL #2   Title Patient to perform all transfers with SBA( except floor transfers)    Baseline CGA/MinA for STS transfers    Time 8    Period Weeks    Status New    Target Date 06/04/21  PT LONG TERM GOAL #3   Title Patient to ambulate 581f with RW, R AFO with SBA    Baseline [facility] with RW and R AFO using CGA.MinA    Time 8    Period Weeks    Status New    Target Date 06/04/21      PT LONG TERM GOAL #4   Title Assess progress towards gait speed    Baseline TBD    Time 8    Period Weeks    Status New     Target Date 06/04/21      PT LONG TERM GOAL #5   Title Assess progress towards DGI    Baseline TBD    Time 8    Period Weeks    Status New    Target Date 06/04/21                   Plan - 05/20/21 1620     Clinical Impression Statement Todays session continued to focus on RLE AROM and function incorporating some core stabilization.  Followed through with transfer training emphasizing proper form, body mechanics and safety.  Continued ambulation and gait training with HW encouraging forward head and step through gait as appropriate.  Showing improving cadence and AROM in RLE as evidenced by observation during treatment.    Personal Factors and Comorbidities Comorbidity 2    Comorbidities HTN, Alcohol Abuse, Diverticulosis    Examination-Activity Limitations Bed Mobility;Bathing;Sit;Transfers;Dressing;Stairs;Stand;Locomotion Level;Toileting    Examination-Participation Restrictions Occupation;Community Activity;Driving    Stability/Clinical Decision Making Evolving/Moderate complexity    Rehab Potential Good    PT Frequency 2x / week    PT Duration 8 weeks   1 week - last authorized Medicaid visit   PT Treatment/Interventions ADLs/Self Care Home Management;Electrical Stimulation;DME Instruction;Gait training;Stair training;Therapeutic activities;Functional mobility training;Therapeutic exercise;Balance training;Neuromuscular re-education;Patient/family education;Orthotic Fit/Training;Wheelchair mobility training;Manual techniques;Passive range of motion    PT Next Visit Plan HEP review, DGI when appropriate, R hip flexion strengthening and stepping tasks    PT Home Exercise Plan Access Code: [email or web address]  Consulted and Agree with Plan of Care Patient;Family member/caregiver    Family Member Consulted Daughter             Patient will benefit from skilled therapeutic intervention in order to improve the following deficits and impairments:  Abnormal gait, Decreased balance,  Decreased mobility, Decreased endurance, Difficulty walking, Impaired sensation, Impaired UE functional use, Decreased strength, Decreased safety awareness, Decreased knowledge of use of DME, Decreased activity tolerance, Decreased range of motion, Impaired tone, Improper body mechanics  Visit Diagnosis: 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)  Unsteadiness on feet  Other abnormalities of gait and mobility  Muscle weakness (generalized)     Problem List Patient Active Problem List   Diagnosis Date Noted   Pain due to onychomycosis of toenails of both feet 11/19/2020   Anemia 05/31/2020   Acute ischemic left MCA stroke (HCPic[place]07/29/2021   Right hemiplegia (HCLaram[place]07/29/2021   CVA (cerebral vascular accident) (HCEast [place]07/20/2021   Unspecified atrial fibrillation ([place]CP[place]07/20/2021   Essential hypertension 04/21/2020   Alcohol abuse 04/21/2020    JeLanice ShirtsT 05/20/2021, 4:31 PM  [address]narBad [id number]: [phone] Fax:  [phone]: CoMakeya HilgertRN: [id number] of Birth: 6/03-11-1955

## 2021-05-20 NOTE — Therapy (Signed)
[facility] [address] [place], [place], [address] Phone: [phone]   Fax:  [phone]  Speech Language Pathology Treatment  Patient Details  Name: Lindsey [name] MRN: [id number] Date of Birth: [birth year] Referring Provider (SLP): Carlis Abbott Drema Pry, MD   Encounter Date: 05/20/2021   End of Session - 05/20/21 1621     Visit Number 8    Number of Visits 25    Date for SLP Re-Evaluation 06/24/21    Authorization Type Medicare    SLP Start Time 1453    SLP Stop Time  1530    SLP Time Calculation (min) 37 min    Activity Tolerance Patient tolerated treatment well             Past Medical History:  Diagnosis Date   Diverticulosis    Hypertension    Uterine prolapse     History reviewed. No pertinent surgical history.  There were no vitals filed for this visit.   Subjective Assessment - 05/20/21 1608     Subjective "Fine"    Patient is accompained by: Family member   daughter   Currently in Pain? No/denies                   ADULT SLP TREATMENT - 05/20/21 1512       General Information   Behavior/Cognition Alert;Cooperative;Pleasant mood      Treatment Provided   Treatment provided Cognitive-Linquistic      Cognitive-Linquistic Treatment   Treatment focused on Aphasia;Apraxia;Patient/family/caregiver education    Skilled Treatment Pt's daughter present this session. HEP initiated to ID items in photo with word prompt and phrase prompt. Increased accuracy for word versus phrase reported, with occasional perseverations reported. SLP trialed having pt read the word and point to the item, in which pt completed with 25% accuracy (1/4). Accuracy improved to 75% accuracy with verbal prompt. Some perseverations noted. SLP conducted trials of Lingraphica, in which pt demonstrates increasing comprehension and ability to utilize device to select choices and communicate understanding. Pt able to  verbalize word/phrase given Lingraphica model with approximately 60% accuracy.      Assessment / Recommendations / Plan   Plan Continue with current plan of care      Progression Toward Goals   Progression toward goals Progressing toward goals              SLP Education - 05/20/21 1621     Education Details HEP, Lingraphica trials    Person(s) Educated Patient;Child(ren)    Methods Explanation;Demonstration    Comprehension Verbalized understanding;Returned demonstration;Need further instruction              SLP Short Term Goals - 05/20/21 1623       SLP SHORT TERM GOAL #1   Title Pt will ID paraphasias/perseverations and attempt to correct errors with pause and retrial for 4/5 opportunities given occasional mod A over 2 sessions    Baseline 05-04-21, 05-20-21    Status Achieved      SLP SHORT TERM GOAL #2   Title Given verbal or visual prompt, pt will correctly ID written word/object/icon in field of 4-6 to communicate wants/needs with 75% accuracy given occasional mod A over 2 sessions    Baseline 05-04-21, 05-18-21    Status Achieved      SLP SHORT TERM GOAL #3   Title Pt will use multimodal/AAC to ID biographical information and personal interests given 2-6 choices with occasional mod A over 2 sessions  Baseline 05-18-21    Time 1    Period Weeks    Status On-going      SLP SHORT TERM GOAL #4   Title Pt will verbally approximate 3 significant words for 5/10 trials given occasional fading to rare mod A over 2 sessions    Baseline 05-20-21    Time 1    Period Weeks    Status On-going              SLP Long Term Goals - 05/20/21 1624       SLP LONG TERM GOAL #1   Title Pt will spontaneously use multimodal communication system for 4/5 opportunities to increase ability to express wants/needs given >2 choices over 2 sessions    Time 8    Period Weeks    Status On-going      SLP LONG TERM GOAL #2   Title Pt will ID 80% of paraphasias/perseverations in a  session and attempt to self-correct given occasional min A over 2 sessions    Time 8    Period Weeks    Status On-going      SLP LONG TERM GOAL #3   Title Caregiver will appropriate cue patient when apraxic/aphasic errors occurs for 4/5 opportunities over 2 sessions    Time 8    Period Weeks    Status On-going      SLP LONG TERM GOAL #4   Title Pt/daughter will report improved communication effectiveness via PROM by 2 points by last ST session    Time 8    Period Weeks    Status On-going              Plan - 05/20/21 1622     Clinical Impression Statement Lindsey [name] presents for OPST intervention to address 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 SLP continued trials of Lingraphica this session, in which pt able to ID icons for needs with ~75% accuracy, which improved to ~95% accuracy with secondary repetition. Increased verbal output exhibited at phrase level with use of device. Pt benefited from occasional verbal cues and some intermittent syllable segmentation to aid verbal output. 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 SLP to request Lingraphica device trial. Skilled ST is recommended to address aphasia and apraxia impacting pt's abilty to communicate effecitvely with both familiar and unfamiliar listeners to convey wants/needs, safety concerns, and participate in social situations.    Speech Therapy Frequency 2x / week    Duration 12 weeks    Treatment/Interventions Compensatory strategies;Patient/family education;Functional tasks;Cueing hierarchy;Environmental controls;Cognitive reorganization;Multimodal communcation approach;Language facilitation;Compensatory techniques;Internal/external aids;SLP instruction and feedback;Other (comment)    Potential to Achieve Goals Fair    Potential Considerations Severity of impairments;Previous level of function    SLP Home Exercise Plan  provided    Consulted and Agree with Plan of Care Patient;Family member/caregiver    Family Member Consulted daughter             Patient will benefit from skilled therapeutic intervention in order to improve the following deficits and impairments:   Aphasia  Verbal apraxia    Problem List Patient Active Problem List   Diagnosis Date Noted   Pain due to onychomycosis of toenails of both feet 11/19/2020   Anemia 05/31/2020   Acute ischemic left MCA stroke (HCC) 04/30/2020   Right hemiplegia (HCC) 04/30/2020   CVA (cerebral vascular accident) (HCC) 04/21/2020   Unspecified atrial fibrillation (HCC) 04/21/2020  Essential hypertension 04/21/2020   Alcohol abuse 04/21/2020    Janann Colonel, MA CCC-SLP 05/20/2021, 4:24 PM  [facility] [address] [place], [place], [address] Phone: [phone]   Fax:  [phone]   Name: Lindsey [name] MRN: [id number] Date of Birth: [birth year]

## 2021-05-25 ENCOUNTER — Ambulatory Visit: Payer: Medicare Other | Admitting: Occupational Therapy

## 2021-05-25 ENCOUNTER — Ambulatory Visit: Payer: Medicare Other

## 2021-05-25 ENCOUNTER — Encounter: Payer: Self-pay | Admitting: Occupational Therapy

## 2021-05-25 ENCOUNTER — Other Ambulatory Visit: Payer: Self-pay

## 2021-05-25 DIAGNOSIS — I69851 Hemiplegia and hemiparesis following other cerebrovascular disease affecting right dominant side: Secondary | ICD-10-CM

## 2021-05-25 DIAGNOSIS — R41840 Attention and concentration deficit: Secondary | ICD-10-CM

## 2021-05-25 DIAGNOSIS — M6281 Muscle weakness (generalized): Secondary | ICD-10-CM

## 2021-05-25 DIAGNOSIS — M25611 Stiffness of right shoulder, not elsewhere classified: Secondary | ICD-10-CM

## 2021-05-25 DIAGNOSIS — R2681 Unsteadiness on feet: Secondary | ICD-10-CM

## 2021-05-25 DIAGNOSIS — R482 Apraxia: Secondary | ICD-10-CM

## 2021-05-25 DIAGNOSIS — R41842 Visuospatial deficit: Secondary | ICD-10-CM

## 2021-05-25 DIAGNOSIS — R4701 Aphasia: Secondary | ICD-10-CM

## 2021-05-25 NOTE — Therapy (Signed)
[place] [address] [place], [place], [address] Phone: [phone]   Fax:  [phone]  Occupational Therapy Treatment  Patient Details  Name: Lindsey [name] MRN: [id number] Date of Birth: 1956/09/24 Referring Provider (OT): Leeroy Cha, MD   Encounter Date: 05/25/2021   OT End of Session - 05/25/21 1619     Visit Number 8    Number of Visits 24    Date for OT Re-Evaluation 06/24/21    Authorization Type Medicare/ Medicaid    Authorization Time Period Pt has used all MCD visits    Authorization - Visit Number 8    Authorization - Number of Visits 10    Progress Note Due on Visit 10    OT Start Time 1530    OT Stop Time 1613    OT Time Calculation (min) 43 min    Activity Tolerance Patient tolerated treatment well    Behavior During Therapy [facility] for tasks assessed/performed             Past Medical History:  Diagnosis Date   Diverticulosis    Hypertension    Uterine prolapse     History reviewed. No pertinent surgical history.  There were no vitals filed for this visit.   Subjective Assessment - 05/25/21 1534     Subjective  Its busy out there!    Pertinent History PMH significant for HTN, EtOH abuse, diverticulosis, CVA, July 2021, Botox June 2021    Limitations Fall Risk. Not Driving. Aphasia. Sensory deficits RUE. Apraxia.    Currently in Pain? No/denies    Pain Score 0-No pain                          OT Treatments/Exercises (OP) - 05/25/21 0001       Neurological Re-education Exercises   Other Exercises 1 Neuromuscular reeducation - body over stable arm rolling right to left in increasing degrees of shoulder range.  Patient without pain at times staiting "that's good!"  When arm placed in ~ 90* of shoulder flexion - able to allow elbow to flex, then actively extend.  Patient with decreased stiffness in RUE at end of session.  Able to extend shoulder and flex elbow and  reverse with light guidance.      Splinting   Splinting Completed wrist splint and patient wore x 30 min in OT session.  Still has two spots under 1st MCP and dorsum of hand where edging needed.                      OT Short Term Goals - 05/25/21 1621       OT SHORT TERM GOAL #1   Title Pt and caregiver will be independent with updated HEP    Baseline not issued yet    Time 4    Period Weeks    Status On-going      OT SHORT TERM GOAL #2   Title Pt will donn pants with min A    Time 4    Period Weeks    Status Achieved   met for short in the clinic, pt performed with min A for balance     OT SHORT TERM GOAL #3   Title Pt will consistently demonstrate 30* active elbow flexion  in prep for  functional use.    Baseline trace elbow flexion    Time 4    Period Weeks  Status On-going      OT SHORT TERM GOAL #4   Title Pt consistently demonstrate 30 wrist extension in prep for functional use of RUE.    Baseline trace wrist extension    Time 4    Period Weeks    Status On-going      OT SHORT TERM GOAL #5   Title Pt will bathe her UB with min A    Baseline mod A    Time 4    Period Weeks    Status On-going      OT SHORT TERM GOAL #6   Title Pt will demonstrate 90* passive shoulder flexion without pain    Baseline 75*    Time 4    Period Weeks    Status On-going      OT SHORT TERM GOAL #7   Title I with adapted equipment/ adapted strategies for ADLS(ie rocker knife)    Baseline dependent    Time 4    Period Weeks    Status On-going               OT Long Term Goals - 05/25/21 1621       OT LONG TERM GOAL #1   Title Pt and caregiver will be independent with updated HEP 06/24/21    Baseline not issued    Time 12    Period Weeks    Status On-going      OT LONG TERM GOAL #2   Title Pt will demonstrate ability to stand and pull up her pants with close supervision.    Baseline min A for standing, mod A for LB dressing    Time 12    Period  Weeks    Status On-going      OT LONG TERM GOAL #3   Title Pt will use RUE as a stabilizer/ gross assist consistently at least 75% of the time during ADLS with min prompts    Baseline uses grossly 50% as stabilizer    Time 12    Period Weeks    Status On-going      OT LONG TERM GOAL #4   Title Pt will perform LB bathing with min A    Baseline mod A    Time 12    Period Weeks    Status On-going      OT LONG TERM GOAL #5   Title Pt will perform simple snack or beverage prep with close supervision.    Baseline not currently performing    Time 12    Period Weeks    Status On-going                   Plan - 05/25/21 1620     Clinical Impression Statement Pt continues with excessive stiffness in RUE/RLE    OT Occupational Profile and History Problem Focused Assessment - Including review of records relating to presenting problem    Occupational performance deficits (Please refer to evaluation for details): ADL's;IADL's;Leisure    Body Structure / Function / Physical Skills ADL;Decreased knowledge of use of DME;Strength;Gait;GMC;Tone;Pain;Dexterity;Balance;UE functional use;Proprioception;ROM;IADL;Vision;Sensation;Coordination;FMC;Flexibility;Decreased knowledge of precautions    Cognitive Skills Attention;Energy/Drive;Perception;Understand;Sequencing;Problem Solve;Memory;Safety Awareness;Thought    Rehab Potential Good    Clinical Decision Making Limited treatment options, no task modification necessary    Comorbidities Affecting Occupational Performance: None    Modification or Assistance to Complete Evaluation  No modification of tasks or assist necessary to complete eval    OT Frequency 2x / week  POC written for 12 weeks to account for scheduling issues and missed visits, anticipate d/c after 8 weeks   OT Duration 12 weeks    OT Treatment/Interventions Self-care/ADL training;Moist Heat;DME and/or AE instruction;Balance training;Aquatic Therapy;Therapeutic  activities;Cognitive remediation/compensation;Therapeutic exercise;Visual/perceptual remediation/compensation;Passive range of motion;Neuromuscular education;Functional Mobility Training;Cryotherapy;Electrical Stimulation;Energy conservation;Manual Therapy;Patient/family education;Ultrasound;Paraffin;Fluidtherapy;Splinting    Plan Finish wrist splint, issue aquatic therapy instructions and discuss with pt's family if she is present, continue NMR and ADLs.    OT Home Exercise Plan Self-PROM seated shoulder    Recommended Other Services Pt receiving ST and PT    Consulted and Agree with Plan of Care Patient             Patient will benefit from skilled therapeutic intervention in order to improve the following deficits and impairments:   Body Structure / Function / Physical Skills: ADL, Decreased knowledge of use of DME, Strength, Gait, GMC, Tone, Pain, Dexterity, Balance, UE functional use, Proprioception, ROM, IADL, Vision, Sensation, Coordination, FMC, Flexibility, Decreased knowledge of precautions Cognitive Skills: Attention, Energy/Drive, Perception, Understand, Sequencing, Problem Solve, Memory, Safety Awareness, Thought     Visit Diagnosis: 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)  Unsteadiness on feet  Muscle weakness (generalized)  Stiffness of right shoulder, not elsewhere classified  Visuospatial deficit  Attention and concentration deficit    Problem List Patient Active Problem List   Diagnosis Date Noted   Pain due to onychomycosis of toenails of both feet 11/19/2020   Anemia 05/31/2020   Acute ischemic left MCA stroke ([place]) 04/30/2020   Right hemiplegia ([place]) 04/30/2020   CVA (cerebral vascular accident) ([place]) 04/21/2020   Unspecified atrial fibrillation ([place]) 04/21/2020   Essential hypertension 04/21/2020   Alcohol abuse 04/21/2020    Lindsey [name] 05/25/2021, 4:23 PM  [place] [address] [place], [place], [address] Phone: [phone]   Fax:  [phone]  Name: Lindsey [name] MRN: [id number] Date of Birth: 12-28-1955

## 2021-05-25 NOTE — Therapy (Signed)
[facility] [address] [place], [place], [address] Phone: [phone]   Fax:  [phone]  Speech Language Pathology Treatment  Patient Details  Name: Lindsey [name] MRN: [id number] Date of Birth: [birth year] Referring Provider (SLP): Carlis Abbott Drema Pry, MD   Encounter Date: 05/25/2021   End of Session - 05/25/21 1448     Visit Number 9    Number of Visits 25    Date for SLP Re-Evaluation 06/24/21    Authorization Type Medicare    SLP Start Time 1448    SLP Stop Time  1530    SLP Time Calculation (min) 42 min    Activity Tolerance Patient tolerated treatment well             Past Medical History:  Diagnosis Date   Diverticulosis    Hypertension    Uterine prolapse     History reviewed. No pertinent surgical history.  There were no vitals filed for this visit.   Subjective Assessment - 05/25/21 1448     Subjective "nothing"    Currently in Pain? No/denies                   ADULT SLP TREATMENT - 05/25/21 1448       General Information   Behavior/Cognition Alert;Cooperative;Pleasant mood      Treatment Provided   Treatment provided Cognitive-Linquistic      Cognitive-Linquistic Treatment   Treatment focused on Aphasia;Apraxia;Patient/family/caregiver education    Skilled Treatment SLP educated patient and daughter on [employer] Parents trial device to be issued. SLP provided pt's daughter with customization form. SLP conducted additional training of clinic Lingraphica device. SLP targeted common phrases for conversation and communication of wants/needs, in which pt required occasional mod visual A assistance to correct apraxic errors despite verbal Lingraphica model. Pt ID'd correct icons given choice of 7 with 71% accuracy, which improved to 100% accuracy with second repetition.      Assessment / Recommendations / Plan   Plan Continue with current plan of care      Progression Toward Goals    Progression toward goals Progressing toward goals              SLP Education - 05/25/21 1518     Education Details Lingraphica trial device ordered    Person(s) Educated Patient;Child(ren)    Methods Explanation;Demonstration    Comprehension Verbalized understanding;Returned demonstration;Need further instruction              SLP Short Term Goals - 05/25/21 1522       SLP SHORT TERM GOAL #1   Title Pt will ID paraphasias/perseverations and attempt to correct errors with pause and retrial for 4/5 opportunities given occasional mod A over 2 sessions    Baseline 05-04-21, 05-20-21    Status Achieved      SLP SHORT TERM GOAL #2   Title Given verbal or visual prompt, pt will correctly ID written word/object/icon in field of 4-6 to communicate wants/needs with 75% accuracy given occasional mod A over 2 sessions    Baseline 05-04-21, 05-18-21    Status Achieved      SLP SHORT TERM GOAL #3   Title Pt will use multimodal/AAC to ID biographical information and personal interests given 2-6 choices with occasional mod A over 2 sessions    Baseline 05-18-21, 05-25-21    Status Achieved      SLP SHORT TERM GOAL #4   Title Pt will verbally approximate 3 significant words for  5/10 trials given occasional fading to rare mod A over 2 sessions    Baseline 05-20-21, 05-25-21    Status Achieved              SLP Long Term Goals - 05/25/21 1525       SLP LONG TERM GOAL #1   Title Pt will spontaneously use multimodal communication system for 4/5 opportunities to increase ability to express wants/needs given >2 choices over 2 sessions    Time 7    Period Weeks    Status On-going      SLP LONG TERM GOAL #2   Title Pt will ID 80% of paraphasias/perseverations in a session and attempt to self-correct given occasional min A over 2 sessions    Time 7    Period Weeks    Status On-going      SLP LONG TERM GOAL #3   Title Caregiver will appropriate cue patient when apraxic/aphasic errors  occurs for 4/5 opportunities over 2 sessions    Time 7    Period Weeks    Status On-going      SLP LONG TERM GOAL #4   Title Pt/daughter will report improved communication effectiveness via PROM by 2 points by last ST session    Time 7    Period Weeks    Status On-going              Plan - 05/25/21 1519     Clinical Impression Statement Lindsey [name] presents for OPST intervention to address 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 SLP continued trials of Lingraphica this session, in which pt able to ID icons for needs with 71% accuracy, which improved to 100% accuracy with secondary repetition. Increased verbal output exhibited at phrase level with use of device. Pt benefited from occasional verbal cues and some intermittent syllable segmentation to correct apraxic errors. 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 Lingraphica trial device request submitted and being processed. Skilled ST is recommended to address aphasia and apraxia impacting pt's abilty to communicate effecitvely with both familiar and unfamiliar listeners to convey wants/needs, safety concerns, and participate in social situations.    Speech Therapy Frequency 2x / week    Duration 12 weeks    Treatment/Interventions Compensatory strategies;Patient/family education;Functional tasks;Cueing hierarchy;Environmental controls;Cognitive reorganization;Multimodal communcation approach;Language facilitation;Compensatory techniques;Internal/external aids;SLP instruction and feedback;Other (comment)    Potential to Achieve Goals Fair    Potential Considerations Severity of impairments;Previous level of function    SLP Home Exercise Plan provided    Consulted and Agree with Plan of Care Patient;Family member/caregiver    Family Member Consulted daughter             Patient will benefit from skilled therapeutic intervention in  order to improve the following deficits and impairments:   Aphasia  Verbal apraxia    Problem List Patient Active Problem List   Diagnosis Date Noted   Pain due to onychomycosis of toenails of both feet 11/19/2020   Anemia 05/31/2020   Acute ischemic left MCA stroke (HCC) 04/30/2020   Right hemiplegia (HCC) 04/30/2020   CVA (cerebral vascular accident) (HCC) 04/21/2020   Unspecified atrial fibrillation (HCC) 04/21/2020   Essential hypertension 04/21/2020   Alcohol abuse 04/21/2020    Janann Colonel, MA CCC-SLP 05/25/2021, 4:23 PM  [facility] [address] [place], [place], [address] Phone: [phone]   Fax:  [phone]  Name: Brylie Sneath MRN: [id number] Date of Birth: 1956/01/15

## 2021-05-25 NOTE — Therapy (Signed)
[facility] [address] [place], [place], [address] Phone: [phone]   Fax:  [phone]  Physical Therapy Treatment  Patient Details  Name: Lindsey [name] MRN: [id number] Date of Birth: 06/09/1956 Referring Provider (PT): Krutika P Raulkar, MD   Encounter Date: 05/25/2021   PT End of Session - 05/25/21 1612     Visit Number 6    Number of Visits 17    Date for PT Re-Evaluation 06/04/21    Authorization Type MCR/MCD    Progress Note Due on Visit 10    PT Start Time 1530    PT Stop Time 1615    PT Time Calculation (min) 45 min    Equipment Utilized During Treatment Gait belt    Activity Tolerance Patient tolerated treatment well    Behavior During Therapy WFL for tasks assessed/performed             Past Medical History:  Diagnosis Date   Diverticulosis    Hypertension    Uterine prolapse     History reviewed. No pertinent surgical history.  There were no vitals filed for this visit.   Subjective Assessment - 05/25/21 1614     Subjective No falls to report, reports she walks with her dtr every other day but cannot confirm frequency    Patient is accompained by: Family member    Pertinent History HTN, Alcohol Abuse    Limitations Standing;Walking    How long can you sit comfortably? unlimited    How long can you stand comfortably? <5 min    How long can you walk comfortably? <15min    Patient Stated Goals Be able to walk    Pain Onset More than a month ago                               [facility] Adult PT Treatment/Exercise - 05/25/21 0001       Transfers   Transfers Sit to Stand    Sit to Stand 4: Min guard;5: Supervision    Sit to Stand Details Tactile cues for weight shifting;Tactile cues for placement;Verbal cues for sequencing;Verbal cues for technique;Verbal cues for safe use of DME/AE    Stand to Sit 4: Min guard;5: Supervision    Stand to Sit Details (indicate cue  type and reason) Tactile cues for initiation    Comments 10 reps      Ambulation/Gait   Ambulation/Gait Yes    Ambulation/Gait Assistance 5: Supervision;4: Min guard    Ambulation Distance (Feet) 115 Feet    Assistive device Straight cane    Gait Pattern Step-to pattern;Step-through pattern    Ambulation Surface Level;Indoor      Lumbar Exercises: Supine   Other Supine Lumbar Exercises LTR with tactile assist15x back and forth      Knee/Hip Exercises: Supine   Knee Flexion AAROM;Right;2 sets;15 reps;Limitations    Knee Flexion Limitations perfromed over snmall physioball    Other Supine Knee/Hip Exercises R hip fallouts, 2x15 with tactile cuing and assist                      PT Short Term Goals - 05/20/21 1627       PT SHORT TERM GOAL #1   Title Patient to demo I in initial HEP    Baseline TBD; 04/19/21 Reviewed and added to today.    Time 4    Period Weeks      Status Partially Met    Target Date 05/07/21      PT SHORT TERM GOAL #2   Title Patient will be able to ambulate with HW and SBA x 230 ft to demonstrate improved household mobility    Baseline 198f with RW and CGA/MinA; 05/11/21 Ambulation of 1116f2 with hemiwalker and CGA    Time 4    Period Weeks    Status Revised    Target Date 05/07/21      PT SHORT TERM GOAL #3   Title Patient to transfer to all positions with CGA    Baseline MnA/CGA [facility] able to transfer sit/stand with CGA to SBA    Time 4    Period Weeks    Status Partially Met    Target Date 05/07/21      PT SHORT TERM GOAL #4   Title Patient to negotiate 4 steps with L rail with MinA and step to pattern    Baseline TBD; 05/20/21 Patient able to negotiate 4 steps with step to pattern and L rail under SBA    Time 4    Period Days    Status Achieved    Target Date 05/07/21      PT SHORT TERM GOAL #5   Title Perform DGI and set goal    Baseline TBD    Time 4    Period Weeks    Status New    Target Date  05/07/21      PT SHORT TERM GOAL #6   Title assess gait velocity and set goal    Baseline TBD    Time 4    Period Days    Status New    Target Date 05/07/21               PT Long Term Goals - 04/02/21 0820       PT LONG TERM GOAL #1   Title Patient will be independent with final HEP and walking program within the home with caregiver assistance if needed    Baseline TBD based on previous HEP    Time 8   th visit   Period Weeks    Status New    Target Date 06/04/21      PT LONG TERM GOAL #2   Title Patient to perform all transfers with SBA( except floor transfers)    Baseline CGA/MinA for STS transfers    Time 8    Period Weeks    Status New    Target Date 06/04/21      PT LONG TERM GOAL #3   Title Patient to ambulate 50044fith RW, R AFO with SBA    Baseline 100f12f clinic with RW and R AFO using CGA.MinA    Time 8    Period Weeks    Status New    Target Date 06/04/21      PT LONG TERM GOAL #4   Title Assess progress towards gait speed    Baseline TBD    Time 8    Period Weeks    Status New    Target Date 06/04/21      PT LONG TERM GOAL #5   Title Assess progress towards DGI    Baseline TBD    Time 8    Period Weeks    Status New    Target Date 06/04/21  Plan - 05/25/21 1621     Clinical Impression Statement Todays session introduced supine exercises for proximal R hip control focused on rotational motions, added IR/ER, hip fallouts and SKTC using small physioball.  es breaks needed which proved beneficial as perfomance increased as noted by less tactile cuing/assist needed.  Ambulation with SPC with one episode of mild LOB due to fatigue.  Continued to assist with STS transfers as patient palces R foot to far rearward and limits abilit to fully extend knee.    Personal Factors and Comorbidities Comorbidity 2    Comorbidities HTN, Alcohol Abuse, Diverticulosis    Examination-Activity Limitations Bed  Mobility;Bathing;Sit;Transfers;Dressing;Stairs;Stand;Locomotion Level;Toileting    Examination-Participation Restrictions Occupation;Community Activity;Driving    Stability/Clinical Decision Making Evolving/Moderate complexity    Rehab Potential Good    PT Frequency 2x / week    PT Duration 8 weeks   1 week - last authorized Medicaid visit   PT Treatment/Interventions ADLs/Self Care Home Management;Electrical Stimulation;DME Instruction;Gait training;Stair training;Therapeutic activities;Functional mobility training;Therapeutic exercise;Balance training;Neuromuscular re-education;Patient/family education;Orthotic Fit/Training;Wheelchair mobility training;Manual techniques;Passive range of motion    PT Next Visit Plan HEP review, DGI when appropriate, R hip flexion strengthening and stepping tasks, STS transfers wit proper foot placement    PT Home Exercise Plan Access Code: [email or web address]    Consulted and Agree with Plan of Care Patient;Family member/caregiver    Family Member Consulted Daughter             Patient will benefit from skilled therapeutic intervention in order to improve the following deficits and impairments:  Abnormal gait, Decreased balance, Decreased mobility, Decreased endurance, Difficulty walking, Impaired sensation, Impaired UE functional use, Decreased strength, Decreased safety awareness, Decreased knowledge of use of DME, Decreased activity tolerance, Decreased range of motion, Impaired tone, Improper body mechanics  Visit Diagnosis: 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)  Unsteadiness on feet  Muscle weakness (generalized)     Problem List Patient Active Problem List   Diagnosis Date Noted   Pain due to onychomycosis of toenails of both feet 11/19/2020   Anemia 05/31/2020   Acute ischemic left MCA stroke (HCC) 04/30/2020   Right hemiplegia (HCC) 04/30/2020   CVA (cerebral vascular accident) (HCC)  04/21/2020   Unspecified atrial fibrillation (HCC) 04/21/2020   Essential hypertension 04/21/2020   Alcohol abuse 04/21/2020    Jeffrey M Ziemba PT 05/25/2021, 4:56 PM  [facility] [address] [place], [place], [address] Phone: [phone]   Fax:  [phone]  Name: Lindsey [name] MRN: [id number] Date of Birth: 09/01/1956    

## 2021-05-27 ENCOUNTER — Ambulatory Visit: Payer: Medicare Other | Admitting: Occupational Therapy

## 2021-05-27 ENCOUNTER — Ambulatory Visit: Payer: Medicare Other

## 2021-05-31 ENCOUNTER — Ambulatory Visit: Payer: Medicare Other | Admitting: Occupational Therapy

## 2021-06-01 ENCOUNTER — Ambulatory Visit: Payer: Medicare Other

## 2021-06-01 ENCOUNTER — Other Ambulatory Visit: Payer: Self-pay

## 2021-06-01 ENCOUNTER — Encounter: Payer: Self-pay | Admitting: Occupational Therapy

## 2021-06-01 ENCOUNTER — Ambulatory Visit: Payer: Medicare Other | Admitting: Occupational Therapy

## 2021-06-01 DIAGNOSIS — I69851 Hemiplegia and hemiparesis following other cerebrovascular disease affecting right dominant side: Secondary | ICD-10-CM

## 2021-06-01 DIAGNOSIS — R2681 Unsteadiness on feet: Secondary | ICD-10-CM

## 2021-06-01 DIAGNOSIS — R278 Other lack of coordination: Secondary | ICD-10-CM

## 2021-06-01 DIAGNOSIS — M6281 Muscle weakness (generalized): Secondary | ICD-10-CM

## 2021-06-01 DIAGNOSIS — M25611 Stiffness of right shoulder, not elsewhere classified: Secondary | ICD-10-CM

## 2021-06-01 DIAGNOSIS — R41844 Frontal lobe and executive function deficit: Secondary | ICD-10-CM

## 2021-06-01 DIAGNOSIS — R41840 Attention and concentration deficit: Secondary | ICD-10-CM

## 2021-06-01 DIAGNOSIS — R41842 Visuospatial deficit: Secondary | ICD-10-CM

## 2021-06-01 NOTE — Therapy (Signed)
[facility] [address] [place], [place], [address] Phone: [phone]   Fax:  [phone]  Occupational Therapy Treatment  Patient Details  Name: Lindsey [name] MRN: [id number] Date of Birth: 1956/03/12 Referring Provider (OT): Leeroy Cha, MD   Encounter Date: 06/01/2021   OT End of Session - 06/01/21 1551     Visit Number 9    Number of Visits 24    Date for OT Re-Evaluation 06/24/21    Authorization Type Medicare/ Medicaid    Authorization Time Period Pt has used all MCD visits    Authorization - Visit Number 9    Authorization - Number of Visits 10    Progress Note Due on Visit 10    OT Start Time 1445    OT Stop Time 1530    OT Time Calculation (min) 45 min    Activity Tolerance Patient tolerated treatment well    Behavior During Therapy [facility] for tasks assessed/performed             Past Medical History:  Diagnosis Date   Diverticulosis    Hypertension    Uterine prolapse     History reviewed. No pertinent surgical history.  There were no vitals filed for this visit.   Subjective Assessment - 06/01/21 1546     Subjective  Patient indicates she is not interested in pursuing pool therapy    Patient is accompanied by: Family member    Pertinent History PMH significant for HTN, EtOH abuse, diverticulosis, CVA, July 2021, Botox June 2021    Limitations Fall Risk. Not Driving. Aphasia. Sensory deficits RUE. Apraxia.    Patient Stated Goals trying to get better    Currently in Pain? No/denies    Pain Score 0-No pain                          OT Treatments/Exercises (OP) - 06/01/21 0001       ADLs   LB Dressing Functional mobility as related to lower body dressing.  Daughter indicates patient is able now to get pants on and pulled up to thighs, but then assistance is needed to pull up over hips due to stand balance.  Worked on sit to stand with emphasis on maintaining midline,  and forward weight shift, also stand to sit with same focus.  Used cueing and facilitation to maintain more neutral alignment and to decrease big weight shift to left.  Worked on guiding right arm through motion of pushing down or pulling up lower body clothing.      Splinting   Splinting Finalized wrist splint and educated patient and daughter in wearing schedule.  One hour today after therapy, then remove and ensure no red marks, pressure areas.  If no problems - can wear for 2+ hours at a time throughtout the day.  Continue to wear resting hand splint at night.  Straps replaced on resting hand splint.                    OT Education - 06/01/21 1550     Education Details splint wearing schedule,  Patient and daughter able to effectively don/doff wrist brace.    Person(s) Educated Patient;Child(ren)    Methods Explanation;Demonstration;Tactile cues;Verbal cues    Comprehension Verbalized understanding;Returned demonstration              OT Short Term Goals - 06/01/21 1552       OT SHORT TERM  GOAL #1   Title Pt and caregiver will be independent with updated HEP    Baseline not issued yet    Time 4    Period Weeks    Status On-going      OT SHORT TERM GOAL #2   Title Pt will donn pants with min A    Time 4    Period Weeks    Status Achieved   met for short in the clinic, pt performed with min A for balance     OT SHORT TERM GOAL #3   Title Pt will consistently demonstrate 30* active elbow flexion  in prep for  functional use.    Baseline trace elbow flexion    Time 4    Period Weeks    Status On-going      OT SHORT TERM GOAL #4   Title Pt consistently demonstrate 30 wrist extension in prep for functional use of RUE.    Baseline trace wrist extension    Time 4    Period Weeks    Status On-going      OT SHORT TERM GOAL #5   Title Pt will bathe her UB with min A    Baseline mod A    Time 4    Period Weeks    Status On-going      OT SHORT TERM GOAL #6    Title Pt will demonstrate 90* passive shoulder flexion without pain    Baseline 75*    Time 4    Period Weeks    Status On-going      OT SHORT TERM GOAL #7   Title I with adapted equipment/ adapted strategies for ADLS(ie rocker knife)    Baseline dependent    Time 4    Period Weeks    Status On-going               OT Long Term Goals - 05/25/21 1621       OT LONG TERM GOAL #1   Title Pt and caregiver will be independent with updated HEP 06/24/21    Baseline not issued    Time 12    Period Weeks    Status On-going      OT LONG TERM GOAL #2   Title Pt will demonstrate ability to stand and pull up her pants with close supervision.    Baseline min A for standing, mod A for LB dressing    Time 12    Period Weeks    Status On-going      OT LONG TERM GOAL #3   Title Pt will use RUE as a stabilizer/ gross assist consistently at least 75% of the time during ADLS with min prompts    Baseline uses grossly 50% as stabilizer    Time 12    Period Weeks    Status On-going      OT LONG TERM GOAL #4   Title Pt will perform LB bathing with min A    Baseline mod A    Time 12    Period Weeks    Status On-going      OT LONG TERM GOAL #5   Title Pt will perform simple snack or beverage prep with close supervision.    Baseline not currently performing    Time 12    Period Weeks    Status On-going                   Plan - 06/01/21 1551  Clinical Impression Statement Pt continues with excessive stiffness in RUE/RLE    OT Occupational Profile and History Problem Focused Assessment - Including review of records relating to presenting problem    Occupational performance deficits (Please refer to evaluation for details): ADL's;IADL's;Leisure    Body Structure / Function / Physical Skills ADL;Decreased knowledge of use of DME;Strength;Gait;GMC;Tone;Pain;Dexterity;Balance;UE functional use;Proprioception;ROM;IADL;Vision;Sensation;Coordination;FMC;Flexibility;Decreased  knowledge of precautions    Cognitive Skills Attention;Energy/Drive;Perception;Understand;Sequencing;Problem Solve;Memory;Safety Awareness;Thought    Rehab Potential Good    Clinical Decision Making Limited treatment options, no task modification necessary    Comorbidities Affecting Occupational Performance: None    Modification or Assistance to Complete Evaluation  No modification of tasks or assist necessary to complete eval    OT Frequency 2x / week   POC written for 12 weeks to account for scheduling issues and missed visits, anticipate d/c after 8 weeks   OT Duration 12 weeks    OT Treatment/Interventions Self-care/ADL training;Moist Heat;DME and/or AE instruction;Balance training;Aquatic Therapy;Therapeutic activities;Cognitive remediation/compensation;Therapeutic exercise;Visual/perceptual remediation/compensation;Passive range of motion;Neuromuscular education;Functional Mobility Training;Cryotherapy;Electrical Stimulation;Energy conservation;Manual Therapy;Patient/family education;Ultrasound;Paraffin;Fluidtherapy;Splinting    Plan 10th visit progress note - check goals!  continue NMR and ADLs.    OT Home Exercise Plan Self-PROM seated shoulder    Recommended Other Services Pt receiving ST and PT    Consulted and Agree with Plan of Care Patient             Patient will benefit from skilled therapeutic intervention in order to improve the following deficits and impairments:   Body Structure / Function / Physical Skills: ADL, Decreased knowledge of use of DME, Strength, Gait, GMC, Tone, Pain, Dexterity, Balance, UE functional use, Proprioception, ROM, IADL, Vision, Sensation, Coordination, FMC, Flexibility, Decreased knowledge of precautions Cognitive Skills: Attention, Energy/Drive, Perception, Understand, Sequencing, Problem Solve, Memory, Safety Awareness, Thought     Visit Diagnosis: Hemiplegia and hemiparesis following other cerebrovascular disease affecting right dominant side  (HCC)  Unsteadiness on feet  Muscle weakness (generalized)  Stiffness of right shoulder, not elsewhere classified  Visuospatial deficit  Attention and concentration deficit  Frontal lobe and executive function deficit  Other lack of coordination    Problem List Patient Active Problem List   Diagnosis Date Noted   Pain due to onychomycosis of toenails of both feet 11/19/2020   Anemia 05/31/2020   Acute ischemic left MCA stroke ([place]) 04/30/2020   Right hemiplegia ([place]) 04/30/2020   CVA (cerebral vascular accident) ([place]) 04/21/2020   Unspecified atrial fibrillation ([place]) 04/21/2020   Essential hypertension 04/21/2020   Alcohol abuse 04/21/2020    Mariah Milling 06/01/2021, 3:53 PM  [facility] [address] [place], [place], [address] Phone: [phone]   Fax:  [phone]  Name: Lindsey [name] MRN: [id number] Date of Birth: 03/02/1956

## 2021-06-01 NOTE — Therapy (Signed)
[facility] [address] [place], [place], [address] Phone: [phone]   Fax:  [phone]  Physical Therapy Treatment  Patient Details  Name: Lindsey [name] MRN: [id number] Date of Birth: [birth year] Referring Provider (PT): Izora Ribas, MD   Encounter Date: 06/01/2021    Past Medical History:  Diagnosis Date   Diverticulosis    Hypertension    Uterine prolapse     History reviewed. No pertinent surgical history.  There were no vitals filed for this visit.   Subjective Assessment - 06/01/21 1536     Subjective No falls or blance losses, continues to ambulate every other day with RW    Patient is accompained by: Family member    Pertinent History HTN, Alcohol Abuse    Limitations Standing;Walking    How long can you sit comfortably? unlimited    How long can you stand comfortably? <5 min    How long can you walk comfortably? <87mn    Patient Stated Goals Be able to walk    Pain Onset More than a month ago                               [facility]/Exercise - 06/01/21 0001       Transfers   Transfers Sit to Stand    Sit to Stand 5: Supervision;4: Min guard    Sit to Stand Details Tactile cues for weight shifting;Tactile cues for placement;Verbal cues for sequencing;Verbal cues for technique;Verbal cues for safe use of DME/AE    Stand to Sit 4: Min guard;5: Supervision    Stand to Sit Details (indicate cue type and reason) Tactile cues for sequencing    Comments cued to maintain midline position      Ambulation/Gait   Ambulation/Gait Yes    Ambulation/Gait Assistance 4: Min guard;4: Min assist    Ambulation Distance (Feet) 115 Feet    Assistive device Straight cane    Gait Pattern Step-to pattern;Step-through pattern;Trunk flexed    Ambulation Surface Level;Indoor                 Balance Exercises - 06/01/21 0001       Balance Exercises: Standing    Stepping Strategy Anterior;UE support;10 reps;Limitations;Lateral    Stepping Strategy Limitations sidestepping and fwd/bwd stepping emphasizing increased step length, 10x per leg in ea. direction, TCs to facilitte weight shift    Step Ups Forward;2 inch;UE support 1;Limitations;Lateral    Step Ups Limitations standing with hemiwalker, stepping onto 2' block 10x requiring MinA to flex R hip                 PT Short Term Goals - 05/20/21 1627       PT SHORT TERM GOAL #1   Title Patient to demo I in initial HEP    Baseline TBD; 04/19/21 Reviewed and added to today.    Time 4    Period Weeks    Status Partially Met    Target Date 05/07/21      PT SHORT TERM GOAL #2   Title Patient will be able to ambulate with HW and SBA x 230 ft to demonstrate improved household mobility    Baseline 1040fwith RW and CGA/MinA; 05/11/21 Ambulation of 11533f with hemiwalker and CGA    Time 4    Period Weeks    Status Revised    Target Date 05/07/21  PT SHORT TERM GOAL #3   Title Patient to transfer to all positions with CGA    Baseline MnA/CGA for STS transfer from [facility]; Patient able to transfer sit/stand with CGA to SBA    Time 4    Period Weeks    Status Partially Met    Target Date 05/07/21      PT SHORT TERM GOAL #4   Title Patient to negotiate 4 steps with L rail with MinA and step to pattern    Baseline TBD; 05/20/21 Patient able to negotiate 4 steps with step to pattern and L rail under SBA    Time 4    Period Days    Status Achieved    Target Date 05/07/21      PT SHORT TERM GOAL #5   Title Perform DGI and set goal    Baseline TBD    Time 4    Period Weeks    Status New    Target Date 05/07/21      PT SHORT TERM GOAL #6   Title assess gait velocity and set goal    Baseline TBD    Time 4    Period Days    Status New    Target Date 05/07/21               PT Long Term Goals - 04/02/21 0820       PT LONG TERM GOAL #1   Title Patient will be independent with  final HEP and walking program within the home with caregiver assistance if needed    Baseline TBD based on previous HEP    Time 8   th visit   Period Weeks    Status New    Target Date 06/04/21      PT LONG TERM GOAL #2   Title Patient to perform all transfers with SBA( except floor transfers)    Baseline CGA/MinA for STS transfers    Time 8    Period Weeks    Status New    Target Date 06/04/21      PT LONG TERM GOAL #3   Title Patient to ambulate 592f with RW, R AFO with SBA    Baseline 1055fin clinic with RW and R AFO using CGA.MinA    Time 8    Period Weeks    Status New    Target Date 06/04/21      PT LONG TERM GOAL #4   Title Assess progress towards gait speed    Baseline TBD    Time 8    Period Weeks    Status New    Target Date 06/04/21      PT LONG TERM GOAL #5   Title Assess progress towards DGI    Baseline TBD    Time 8    Period Weeks    Status New    Target Date 06/04/21                   Plan - 06/01/21 1603     Clinical Impression Statement Todays sesion focused on transfers and weight shift activities to achieve midline, encouraged incrreased step length when ambulating and able to demo improved gait pattern when cued and task facilitated.  Reluctant to WSDatelandnto RLE and favors L or midline wit all WB tasks.  Remains reluctant to fully WB onto RLE and treatment added manual fcilitation of WS in diagonal directions.    Personal Factors and Comorbidities Comorbidity 2  Comorbidities HTN, Alcohol Abuse, Diverticulosis    Examination-Activity Limitations Bed Mobility;Bathing;Sit;Transfers;Dressing;Stairs;Stand;Locomotion Level;Toileting    Examination-Participation Restrictions Occupation;Community Activity;Driving    Stability/Clinical Decision Making Evolving/Moderate complexity    Rehab Potential Good    PT Frequency 2x / week    PT Duration 8 weeks   1 week - last authorized Medicaid visit   PT Treatment/Interventions ADLs/Self Care Home  Management;Electrical Stimulation;DME Instruction;Gait training;Stair training;Therapeutic activities;Functional mobility training;Therapeutic exercise;Balance training;Neuromuscular re-education;Patient/family education;Orthotic Fit/Training;Wheelchair mobility training;Manual techniques;Passive range of motion    PT Next Visit Plan DGI when appropriate, R hip flexion strengthening and stepping tasks, STS transfers wit proper foot placement, WS onto RLE in all directions    PT Home Exercise Plan Access Code: [email or web address]    Consulted and Agree with Plan of Care Patient;Family member/caregiver    Family Member Consulted Daughter             Patient will benefit from skilled therapeutic intervention in order to improve the following deficits and impairments:  Abnormal gait, Decreased balance, Decreased mobility, Decreased endurance, Difficulty walking, Impaired sensation, Impaired UE functional use, Decreased strength, Decreased safety awareness, Decreased knowledge of use of DME, Decreased activity tolerance, Decreased range of motion, Impaired tone, Improper body mechanics  Visit Diagnosis: 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 [place], [place], [address] Phone: [phone]   Fax:  [phone]  Name: Lindsey [name] MRN: [id number] Date of  Birth: 08-May-1956

## 2021-06-03 ENCOUNTER — Ambulatory Visit: Payer: Medicare Other | Attending: Physical Medicine and Rehabilitation

## 2021-06-03 ENCOUNTER — Ambulatory Visit: Payer: Medicare Other | Admitting: Occupational Therapy

## 2021-06-03 ENCOUNTER — Encounter: Payer: Self-pay | Admitting: Occupational Therapy

## 2021-06-03 ENCOUNTER — Other Ambulatory Visit: Payer: Self-pay

## 2021-06-03 DIAGNOSIS — I69851 Hemiplegia and hemiparesis following other cerebrovascular disease affecting right dominant side: Secondary | ICD-10-CM | POA: Insufficient documentation

## 2021-06-03 DIAGNOSIS — R4701 Aphasia: Secondary | ICD-10-CM | POA: Diagnosis present

## 2021-06-03 DIAGNOSIS — R482 Apraxia: Secondary | ICD-10-CM | POA: Insufficient documentation

## 2021-06-03 DIAGNOSIS — R41840 Attention and concentration deficit: Secondary | ICD-10-CM | POA: Diagnosis present

## 2021-06-03 DIAGNOSIS — M6281 Muscle weakness (generalized): Secondary | ICD-10-CM | POA: Insufficient documentation

## 2021-06-03 DIAGNOSIS — R278 Other lack of coordination: Secondary | ICD-10-CM | POA: Insufficient documentation

## 2021-06-03 DIAGNOSIS — R41842 Visuospatial deficit: Secondary | ICD-10-CM | POA: Insufficient documentation

## 2021-06-03 DIAGNOSIS — R41844 Frontal lobe and executive function deficit: Secondary | ICD-10-CM | POA: Diagnosis present

## 2021-06-03 DIAGNOSIS — M25611 Stiffness of right shoulder, not elsewhere classified: Secondary | ICD-10-CM

## 2021-06-03 DIAGNOSIS — R2681 Unsteadiness on feet: Secondary | ICD-10-CM | POA: Insufficient documentation

## 2021-06-03 DIAGNOSIS — R2689 Other abnormalities of gait and mobility: Secondary | ICD-10-CM | POA: Diagnosis present

## 2021-06-03 NOTE — Therapy (Signed)
[facility] [address] [place], [place], [address] Phone: [phone]   Fax:  [phone]  Physical Therapy Treatment  Patient Details  Name: Lindsey [name] MRN: [id number] Date of Birth: 07/22/1956 Referring Provider (PT): Izora Ribas, MD   Encounter Date: 06/03/2021   PT End of Session - 06/03/21 1618     Visit Number 8    Number of Visits 17    Date for PT Re-Evaluation 06/04/21    Authorization Type MCR/MCD    Authorization - Visit Number 9    Authorization - Number of Visits 9    Progress Note Due on Visit 10    PT Start Time 8416    PT Stop Time 1700    PT Time Calculation (min) 45 min    Equipment Utilized During Treatment Gait belt    Activity Tolerance Patient tolerated treatment well    Behavior During Therapy [facility] for tasks assessed/performed             Past Medical History:  Diagnosis Date   Diverticulosis    Hypertension    Uterine prolapse     History reviewed. No pertinent surgical history.  There were no vitals filed for this visit.   Subjective Assessment - 06/03/21 1617     Subjective Denies any falls and states she is ambulating almost daily today    Patient is accompained by: Family member    Pertinent History HTN, Alcohol Abuse    Limitations Standing;Walking    How long can you sit comfortably? unlimited    How long can you stand comfortably? <5 min    How long can you walk comfortably? <87min    Patient Stated Goals Be able to walk    Pain Onset More than a month ago                               [facility] Adult PT Treatment/Exercise - 06/03/21 0001       Transfers   Transfers Sit to Stand;Stand Pivot Transfers    Sit to Stand 5: Supervision    Sit to Stand Details Tactile cues for weight shifting;Tactile cues for placement;Verbal cues for sequencing;Verbal cues for technique;Verbal cues for safe use of DME/AE    Stand to Sit 5: Supervision     Stand to Sit Details (indicate cue type and reason) Tactile cues for sequencing    Stand Pivot Transfers 4: Min guard;4: Min assist    Number of Reps 10 reps    Comments able to maintain midline an dnot brace knees      Ambulation/Gait   Ambulation/Gait Yes    Ambulation/Gait Assistance 4: Min guard    Ambulation Distance (Feet) 115 Feet    Assistive device Hemi-walker    Gait Pattern Step-to pattern;Step-through pattern;Trunk flexed    Ambulation Surface Level;Indoor    Gait Comments good R step lenght, but shortened L step length.                 Balance Exercises - 06/03/21 0001       Balance Exercises: Standing   Stepping Strategy Anterior;UE support;10 reps;Limitations;Lateral    Stepping Strategy Limitations sidestepping and fwd/bwd stepping emphasizing increased step length, 10x per leg in ea. direction, TCs to facilitte weight shift    Step Ups Forward;2 inch;UE support 1;Limitations;Lateral    Step Ups Limitations standing with hemiwalker, stepping onto 2' block 10x requiring MinA  to flex R hip                 PT Short Term Goals - 05/20/21 1627       PT SHORT TERM GOAL #1   Title Patient to demo I in initial HEP    Baseline TBD; 04/19/21 Reviewed and added to today.    Time 4    Period Weeks    Status Partially Met    Target Date 05/07/21      PT SHORT TERM GOAL #2   Title Patient will be able to ambulate with HW and SBA x 230 ft to demonstrate improved household mobility    Baseline 157ft with RW and CGA/MinA; 05/11/21 Ambulation of 143ftx2 with hemiwalker and CGA    Time 4    Period Weeks    Status Revised    Target Date 05/07/21      PT SHORT TERM GOAL #3   Title Patient to transfer to all positions with CGA    Baseline MnA/CGA for STS transfer from [facility]; Patient able to transfer sit/stand with CGA to SBA    Time 4    Period Weeks    Status Partially Met    Target Date 05/07/21      PT SHORT TERM GOAL #4   Title Patient to negotiate 4 steps  with L rail with MinA and step to pattern    Baseline TBD; 05/20/21 Patient able to negotiate 4 steps with step to pattern and L rail under SBA    Time 4    Period Days    Status Achieved    Target Date 05/07/21      PT SHORT TERM GOAL #5   Title Perform DGI and set goal    Baseline TBD    Time 4    Period Weeks    Status New    Target Date 05/07/21      PT SHORT TERM GOAL #6   Title assess gait velocity and set goal    Baseline TBD    Time 4    Period Days    Status New    Target Date 05/07/21               PT Long Term Goals - 04/02/21 0820       PT LONG TERM GOAL #1   Title Patient will be independent with final HEP and walking program within the home with caregiver assistance if needed    Baseline TBD based on previous HEP    Time 8   th visit   Period Weeks    Status New    Target Date 06/04/21      PT LONG TERM GOAL #2   Title Patient to perform all transfers with SBA( except floor transfers)    Baseline CGA/MinA for STS transfers    Time 8    Period Weeks    Status New    Target Date 06/04/21      PT LONG TERM GOAL #3   Title Patient to ambulate 547ft with RW, R AFO with SBA    Baseline 156ft in clinic with RW and R AFO using CGA.MinA    Time 8    Period Weeks    Status New    Target Date 06/04/21      PT LONG TERM GOAL #4   Title Assess progress towards gait speed    Baseline TBD    Time 8    Period Weeks  Status New    Target Date 06/04/21      PT LONG TERM GOAL #5   Title Assess progress towards DGI    Baseline TBD    Time 8    Period Weeks    Status New    Target Date 06/04/21                   Plan - 06/03/21 1628     Clinical Impression Statement Todays session focused on weight shifting over RLE and increasing step length during ambulation.  Patient able to lengthen R step length but L step length shorter.  Nore consistency with step through pattern today.  Added floor targets to simulate step through pattern using  hemiwalker for support.  HW sed today to provide optimum single UE support to allow focus on increasing step length.  manual facilitation used to promote WS during stepping tasks to encourage postural correction and discourage downward gaze    Personal Factors and Comorbidities Comorbidity 2    Comorbidities HTN, Alcohol Abuse, Diverticulosis    Examination-Activity Limitations Bed Mobility;Bathing;Sit;Transfers;Dressing;Stairs;Stand;Locomotion Level;Toileting    Examination-Participation Restrictions Occupation;Community Activity;Driving    Stability/Clinical Decision Making Evolving/Moderate complexity    Rehab Potential Good    PT Frequency 2x / week    PT Duration 8 weeks   1 week - last authorized Medicaid visit   PT Treatment/Interventions ADLs/Self Care Home Management;Electrical Stimulation;DME Instruction;Gait training;Stair training;Therapeutic activities;Functional mobility training;Therapeutic exercise;Balance training;Neuromuscular re-education;Patient/family education;Orthotic Fit/Training;Wheelchair mobility training;Manual techniques;Passive range of motion    PT Next Visit Plan DGI when appropriate, R hip flexion strengthening and stepping tasks, continue to facilitate RLE stepping motions    PT Home Exercise Plan Access Code: [email or web address]    Consulted and Agree with Plan of Care Patient;Family member/caregiver    Family Member Consulted Daughter             Patient will benefit from skilled therapeutic intervention in order to improve the following deficits and impairments:  Abnormal gait, Decreased balance, Decreased mobility, Decreased endurance, Difficulty walking, Impaired sensation, Impaired UE functional use, Decreased strength, Decreased safety awareness, Decreased knowledge of use of DME, Decreased activity tolerance, Decreased range of motion, Impaired tone, Improper body mechanics  Visit Diagnosis: Hemiplegia and hemiparesis following other cerebrovascular disease  affecting right dominant side (HCC)  Unsteadiness on feet  Muscle weakness (generalized)     Problem List Patient Active Problem List   Diagnosis Date Noted   Pain due to onychomycosis of toenails of both feet 11/19/2020   Anemia 05/31/2020   Acute ischemic left MCA stroke ([place]) 04/30/2020   Right hemiplegia ([place]) 04/30/2020   CVA (cerebral vascular accident) ([place]) 04/21/2020   Unspecified atrial fibrillation ([place]) 04/21/2020   Essential hypertension 04/21/2020   Alcohol abuse 04/21/2020    Lanice Shirts 06/03/2021, 4:55 PM  [facility] [address] [place], [place], [address] Phone: [phone]   Fax:  [phone]  Name: Lindsey [name] MRN: [id number] Date of Birth: Jan 05, 1956

## 2021-06-03 NOTE — Therapy (Signed)
[facility] [address] [place], [place], [address] Phone: [phone]   Fax:  [phone]  Occupational Therapy Treatment and Progress Update  Patient Details  Name: Lindsey [name] MRN: [id number] Date of Birth: 06/02/1956 Referring Provider (OT): Leeroy Cha, MD  This progress report covers dates of service from 6/30-06/03/21.    Encounter Date: 06/03/2021   OT End of Session - 06/03/21 1703     Visit Number 10    Number of Visits 24    Date for OT Re-Evaluation 06/24/21    Authorization Type Medicare/ Medicaid    Authorization Time Period Pt has used all MCD visits    Authorization - Visit Number 10    Authorization - Number of Visits 10    Progress Note Due on Visit 20    OT Start Time 1308    OT Stop Time 1615    OT Time Calculation (min) 45 min    Activity Tolerance Patient tolerated treatment well    Behavior During Therapy WFL for tasks assessed/performed             Past Medical History:  Diagnosis Date   Diverticulosis    Hypertension    Uterine prolapse     History reviewed. No pertinent surgical history.  There were no vitals filed for this visit.   Subjective Assessment - 06/03/21 1534     Subjective  Patient indicates splint is fine    Patient is accompanied by: Family member    Pertinent History PMH significant for HTN, EtOH abuse, diverticulosis, CVA, July 2021, Botox June 2021    Limitations Fall Risk. Not Driving. Aphasia. Sensory deficits RUE. Apraxia.    Currently in Pain? No/denies    Pain Score 0-No pain                          OT Treatments/Exercises (OP) - 06/03/21 0001       ADLs   Eating Worked on adaptive method for cutting food - patient liked t-shaped rocker knife best - given resource for this.    LB Dressing Practiced donning / doffing AFO and right shoe.  Able to don AFO, but needed assistance to get shoe over AFO.  Did best with rigid  shoehorn.  Patient able to tie shoe (in lap) using one handed technique!  Worked on simulated standing to pull up LB clothing. Patient with improved confidence and competence with safe sit to stand for this task.    ADL Comments Reviewed progress toward remaining goals.  Patient showing improved shoulder range, wrist range, and performance with ADL                    OT Education - 06/03/21 1703     Education Details rocker knife, one handed cutting, one handed shoe tying    Person(s) Educated Patient    Methods Explanation;Demonstration;Verbal cues    Comprehension Returned demonstration              OT Short Term Goals - 06/03/21 1535       OT SHORT TERM GOAL #1   Title Pt and caregiver will be independent with updated HEP    Baseline not issued yet    Time 4    Period Weeks    Status Achieved      OT SHORT TERM GOAL #2   Title Pt will donn pants with min A    Baseline not  reviewed/issued    Time 4    Status Achieved      OT SHORT TERM GOAL #3   Title Pt will consistently demonstrate 30* active elbow flexion  in prep for  functional use.    Baseline trace elbow flexion    Time 4    Period Weeks    Status Not Met   Has ~15 degrees of active flexion inconsistently following facilitation     OT SHORT TERM GOAL #4   Title Pt consistently demonstrate 30 wrist extension in prep for functional use of RUE.    Baseline trace wrist extension    Time 4    Period Weeks    Status Achieved      OT SHORT TERM GOAL #5   Status Achieved      OT SHORT TERM GOAL #6   Title Pt will demonstrate 90* passive shoulder flexion without pain    Baseline 75*    Time 4    Period Weeks    Status Achieved      OT SHORT TERM GOAL #7   Title I with adapted equipment/ adapted strategies for ADLS(ie rocker knife)    Baseline dependent    Time 4    Period Weeks    Status Achieved   in clinic environment able to use rocker knife              OT Long Term Goals -  06/03/21 1552       OT LONG TERM GOAL #1   Title Pt and caregiver will be independent with updated HEP 06/24/21    Baseline not issued    Period Weeks    Status On-going      OT LONG TERM GOAL #2   Title Pt will demonstrate ability to stand and pull up her pants with close supervision.    Baseline min A for standing, mod A for LB dressing    Time 12    Period Weeks    Status On-going   Able to complete in simulated condition in clinic - needs to try at home with daughter     OT LONG TERM GOAL #3   Title Pt will use RUE as a stabilizer/ gross assist consistently at least 75% of the time during ADLS with min prompts    Baseline uses grossly 50% as stabilizer    Time 12    Period Weeks    Status On-going      OT LONG TERM GOAL #4   Title Pt will perform LB bathing with min A    Baseline mod A    Time 12    Period Weeks    Status On-going      OT LONG TERM GOAL #5   Title Pt will perform simple snack or beverage prep with close supervision.    Baseline not currently performing    Time 12    Period Weeks    Status On-going                   Plan - 06/03/21 1704     Clinical Impression Statement Pt has met her short term goals, and is showing nice progress toward more participation in ADL    OT Occupational Profile and History Problem Focused Assessment - Including review of records relating to presenting problem    Occupational performance deficits (Please refer to evaluation for details): ADL's;IADL's;Leisure    Body Structure / Function / Physical Skills ADL;Decreased knowledge of  use of DME;Strength;Gait;GMC;Tone;Pain;Dexterity;Balance;UE functional use;Proprioception;ROM;IADL;Vision;Sensation;Coordination;FMC;Flexibility;Decreased knowledge of precautions    Cognitive Skills Attention;Energy/Drive;Perception;Understand;Sequencing;Problem Solve;Memory;Safety Awareness;Thought    Rehab Potential Good    Clinical Decision Making Limited treatment options, no task  modification necessary    Comorbidities Affecting Occupational Performance: None    Modification or Assistance to Complete Evaluation  No modification of tasks or assist necessary to complete eval    OT Frequency 2x / week    OT Duration 12 weeks    OT Treatment/Interventions Self-care/ADL training;Moist Heat;DME and/or AE instruction;Balance training;Aquatic Therapy;Therapeutic activities;Cognitive remediation/compensation;Therapeutic exercise;Visual/perceptual remediation/compensation;Passive range of motion;Neuromuscular education;Functional Mobility Training;Cryotherapy;Electrical Stimulation;Energy conservation;Manual Therapy;Patient/family education;Ultrasound;Paraffin;Fluidtherapy;Splinting    Plan continue NMR and ADLs.    OT Home Exercise Plan Self-PROM seated shoulder    Recommended Other Services Pt receiving ST and PT    Consulted and Agree with Plan of Care Patient             Patient will benefit from skilled therapeutic intervention in order to improve the following deficits and impairments:   Body Structure / Function / Physical Skills: ADL, Decreased knowledge of use of DME, Strength, Gait, GMC, Tone, Pain, Dexterity, Balance, UE functional use, Proprioception, ROM, IADL, Vision, Sensation, Coordination, FMC, Flexibility, Decreased knowledge of precautions Cognitive Skills: Attention, Energy/Drive, Perception, Understand, Sequencing, Problem Solve, Memory, Safety Awareness, Thought     Visit Diagnosis: 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)  Unsteadiness on feet  Muscle weakness (generalized)  Stiffness of right shoulder, not elsewhere classified  Visuospatial deficit  Other lack of coordination    Problem List Patient Active Problem List   Diagnosis Date Noted   Pain due to onychomycosis of toenails of both feet 11/19/2020   Anemia 05/31/2020   Acute ischemic left MCA stroke ([place]) 04/30/2020    Right hemiplegia ([place]) 04/30/2020   CVA (cerebral vascular accident) ([place]) 04/21/2020   Unspecified atrial fibrillation ([place]) 04/21/2020   Essential hypertension 04/21/2020   Alcohol abuse 04/21/2020    Mariah Milling 06/03/2021, 5:05 PM  [facility] [address] [place], [place], [address] Phone: [phone]   Fax:  [phone]  Name: Charee Tumblin MRN: [id number] Date of Birth: January 28, 1956

## 2021-06-08 ENCOUNTER — Ambulatory Visit: Payer: Medicare Other

## 2021-06-08 ENCOUNTER — Encounter: Payer: Self-pay | Admitting: Occupational Therapy

## 2021-06-08 ENCOUNTER — Other Ambulatory Visit: Payer: Self-pay

## 2021-06-08 ENCOUNTER — Ambulatory Visit: Payer: Medicare Other | Admitting: Occupational Therapy

## 2021-06-08 DIAGNOSIS — R2681 Unsteadiness on feet: Secondary | ICD-10-CM

## 2021-06-08 DIAGNOSIS — I69851 Hemiplegia and hemiparesis following other cerebrovascular disease affecting right dominant side: Secondary | ICD-10-CM

## 2021-06-08 DIAGNOSIS — R482 Apraxia: Secondary | ICD-10-CM

## 2021-06-08 DIAGNOSIS — R4701 Aphasia: Secondary | ICD-10-CM

## 2021-06-08 DIAGNOSIS — M6281 Muscle weakness (generalized): Secondary | ICD-10-CM

## 2021-06-08 DIAGNOSIS — R41840 Attention and concentration deficit: Secondary | ICD-10-CM

## 2021-06-08 DIAGNOSIS — R41842 Visuospatial deficit: Secondary | ICD-10-CM

## 2021-06-08 DIAGNOSIS — M25611 Stiffness of right shoulder, not elsewhere classified: Secondary | ICD-10-CM

## 2021-06-08 DIAGNOSIS — R278 Other lack of coordination: Secondary | ICD-10-CM

## 2021-06-08 NOTE — Therapy (Signed)
[facility] [address] [place], [place], [address] Phone: [phone]   Fax:  [phone]  Occupational Therapy Treatment  Patient Details  Name: Lindsey [name] MRN: [id number] Date of Birth: 1955/11/29 Referring Provider (OT): Leeroy Cha, MD   Encounter Date: 06/08/2021   OT End of Session - 06/08/21 1759     Visit Number 11    Number of Visits 24    Date for OT Re-Evaluation 06/24/21    Authorization Type Medicare/ Medicaid    Authorization Time Period Pt has used all MCD visits    Progress Note Due on Visit 64    OT Start Time 1530    OT Stop Time 1615    OT Time Calculation (min) 45 min    Activity Tolerance Patient tolerated treatment well    Behavior During Therapy [facility] for tasks assessed/performed             Past Medical History:  Diagnosis Date   Diverticulosis    Hypertension    Uterine prolapse     History reviewed. No pertinent surgical history.  There were no vitals filed for this visit.   Subjective Assessment - 06/08/21 1534     Subjective  Patient indicates she worked on standing to pull up pants    Pertinent History PMH significant for HTN, EtOH abuse, diverticulosis, CVA, July 2021, Botox June 2021    Limitations Fall Risk. Not Driving. Aphasia. Sensory deficits RUE. Apraxia.    Patient Stated Goals trying to get better    Currently in Pain? No/denies    Pain Score 0-No pain                          OT Treatments/Exercises (OP) - 06/08/21 1755       ADLs   LB Dressing Continued work on sit to stand alignment of feet on floor and weight shift forward in midline.  Patient showing improved ability to shift forward sufficiently to then have adequate stand balance.  Without cueing and facilitation, patient has difficulty managing right foot, and she is unaware when it is in a position not helpful for stand balance.      Neurological Re-education Exercises    Other Exercises 1 Neuromuscular reeducation to address postural control, postural alignment, and passive to active assisted range of motion throughout RUE.  Patient does best with body on arm motion, followed by active assisted/guided motion.  Patient lacks external rotation due to joint capsule tightness, and muscle imbalance, but this is slowly improving with closed chain movement followed by stretch.                  Upper Extremity Functional Index Score :   /80     OT Short Term Goals - 06/08/21 1800       OT SHORT TERM GOAL #1   Title Pt and caregiver will be independent with updated HEP    Baseline not issued yet    Time 4    Period Weeks    Status Achieved      OT SHORT TERM GOAL #2   Title Pt will donn pants with min A    Baseline not reviewed/issued    Time 4    Status Achieved      OT SHORT TERM GOAL #3   Title Pt will consistently demonstrate 30* active elbow flexion  in prep for  functional use.    Baseline trace elbow  flexion    Time 4    Period Weeks    Status Not Met   Has ~15 degrees of active flexion inconsistently following facilitation     OT SHORT TERM GOAL #4   Title Pt consistently demonstrate 30 wrist extension in prep for functional use of RUE.    Baseline trace wrist extension    Time 4    Period Weeks    Status Achieved      OT SHORT TERM GOAL #5   Status Achieved      OT SHORT TERM GOAL #6   Title Pt will demonstrate 90* passive shoulder flexion without pain    Baseline 75*    Time 4    Period Weeks    Status Achieved      OT SHORT TERM GOAL #7   Title I with adapted equipment/ adapted strategies for ADLS(ie rocker knife)    Baseline dependent    Time 4    Period Weeks    Status Achieved   in clinic environment able to use rocker knife              OT Long Term Goals - 06/08/21 1800       OT LONG TERM GOAL #1   Title Pt and caregiver will be independent with updated HEP 06/24/21    Baseline not issued    Period  Weeks    Status On-going      OT LONG TERM GOAL #2   Title Pt will demonstrate ability to stand and pull up her pants with close supervision.    Baseline min A for standing, mod A for LB dressing    Time 12    Period Weeks    Status On-going   Able to complete in simulated condition in clinic - needs to try at home with daughter     OT LONG TERM GOAL #3   Title Pt will use RUE as a stabilizer/ gross assist consistently at least 75% of the time during ADLS with min prompts    Baseline uses grossly 50% as stabilizer    Time 12    Period Weeks    Status On-going      OT LONG TERM GOAL #4   Title Pt will perform LB bathing with min A    Baseline mod A    Time 12    Period Weeks    Status On-going      OT LONG TERM GOAL #5   Title Pt will perform simple snack or beverage prep with close supervision.    Baseline not currently performing    Time 12    Period Weeks    Status On-going                   Plan - 06/08/21 1800     Clinical Impression Statement Pt is working toward remaining OT goals, and is scheduled for another round of Botox in near future    OT Occupational Profile and History Problem Focused Assessment - Including review of records relating to presenting problem    Occupational performance deficits (Please refer to evaluation for details): ADL's;IADL's;Leisure    Body Structure / Function / Physical Skills ADL;Decreased knowledge of use of DME;Strength;Gait;GMC;Tone;Pain;Dexterity;Balance;UE functional use;Proprioception;ROM;IADL;Vision;Sensation;Coordination;FMC;Flexibility;Decreased knowledge of precautions    Cognitive Skills Attention;Energy/Drive;Perception;Understand;Sequencing;Problem Solve;Memory;Safety Awareness;Thought    Rehab Potential Good    Clinical Decision Making Limited treatment options, no task modification necessary    Comorbidities Affecting Occupational Performance: None  Modification or Assistance to Complete Evaluation  No  modification of tasks or assist necessary to complete eval    OT Frequency 2x / week    OT Duration 12 weeks    OT Treatment/Interventions Self-care/ADL training;Moist Heat;DME and/or AE instruction;Balance training;Aquatic Therapy;Therapeutic activities;Cognitive remediation/compensation;Therapeutic exercise;Visual/perceptual remediation/compensation;Passive range of motion;Neuromuscular education;Functional Mobility Training;Cryotherapy;Electrical Stimulation;Energy conservation;Manual Therapy;Patient/family education;Ultrasound;Paraffin;Fluidtherapy;Splinting    Plan continue NMR and ADLs.    OT Home Exercise Plan Self-PROM seated shoulder    Recommended Other Services Pt receiving ST and PT    Consulted and Agree with Plan of Care Patient             Patient will benefit from skilled therapeutic intervention in order to improve the following deficits and impairments:   Body Structure / Function / Physical Skills: ADL, Decreased knowledge of use of DME, Strength, Gait, GMC, Tone, Pain, Dexterity, Balance, UE functional use, Proprioception, ROM, IADL, Vision, Sensation, Coordination, FMC, Flexibility, Decreased knowledge of precautions Cognitive Skills: Attention, Energy/Drive, Perception, Understand, Sequencing, Problem Solve, Memory, Safety Awareness, Thought     Visit Diagnosis: 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)  Stiffness of right shoulder, not elsewhere classified  Visuospatial deficit  Other lack of coordination  Attention and concentration deficit  Muscle weakness (generalized)    Problem List Patient Active Problem List   Diagnosis Date Noted   Pain due to onychomycosis of toenails of both feet 11/19/2020   Anemia 05/31/2020   Acute ischemic left MCA stroke ([place]) 04/30/2020   Right hemiplegia ([place]) 04/30/2020   CVA (cerebral vascular accident) ([place]) 04/21/2020   Unspecified atrial fibrillation  ([place]) 04/21/2020   Essential hypertension 04/21/2020   Alcohol abuse 04/21/2020    Mariah Milling, OT 06/08/2021, [place] [facility] [address] [place], [place], [address] Phone: [phone]   Fax:  [phone]  Name: Lindsey [name] MRN: [id number] Date of Birth: 1955-12-24

## 2021-06-08 NOTE — Therapy (Signed)
[facility] [address] [place], [place], [address] Phone: [phone]   Fax:  [phone]  Speech Language Pathology Treatment/Progress Note  Patient Details  Name: Lindsey [name] MRN: [id number] Date of Birth: [birth year] Referring Provider (SLP): Ranell Patrick Clide Deutscher, MD   Encounter Date: 06/08/2021   End of Session - 06/08/21 1445     Visit Number 10    Number of Visits 25    Date for SLP Re-Evaluation 06/24/21    Authorization Type Medicare    SLP Start Time 1445    SLP Stop Time  1530    SLP Time Calculation (min) 45 min    Activity Tolerance Patient tolerated treatment well             Past Medical History:  Diagnosis Date   Diverticulosis    Hypertension    Uterine prolapse     History reviewed. No pertinent surgical history.  There were no vitals filed for this visit.   Subjective Assessment - 06/08/21 1446     Subjective "yeah"    Currently in Pain? 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   Objective Measurements: Pt presents with increased communication effectiveness via Lingraphica device as verbal expression is limited by neologisms, perseverations, and apraxic speech errors. Pt demonstrates good comprehension and motivation to use Lingraphica device to optimize communication.   Goal Update: see goals below  Plan: continue per POC  Reason Skilled Services are Required: pt has not yet met rehab potential so skilled ST intervention still warranted to maximize communication effectiveness      ADULT SLP TREATMENT - 06/08/21 1445       General Information   Behavior/Cognition Alert;Cooperative;Pleasant mood       Treatment Provided   Treatment provided Cognitive-Linquistic      Cognitive-Linquistic Treatment   Treatment focused on Aphasia;Apraxia;Patient/family/caregiver education    Skilled Treatment SLP provided education and training with patient and daughter for Adella Hare trial device (30 day device trial begins today). SLP instructed and demonstrated how to modify and optimize pt's device. SLP suggested daily usage of device to facilitate increased user understanding and navigation. Pt and her daughter verbalized understanding and agreement with initiation of device and how to modify device given initial training.      Assessment / Recommendations / Plan   Plan Continue with current plan of care      Progression Toward Goals   Progression toward goals Progressing toward goals              SLP Education - 06/08/21 1816     Education Details Lingraphica device, how to modify    Person(s) Educated Patient;Child(ren)    Methods Explanation;Demonstration    Comprehension Verbalized understanding;Returned demonstration;Need further instruction              SLP Short Term Goals - 05/25/21 1522       SLP SHORT TERM GOAL #1   Title Pt will ID paraphasias/perseverations and attempt to correct errors with pause and retrial for 4/5 opportunities given occasional mod A over 2 sessions    Baseline 05-04-21, 05-20-21    Status Achieved      SLP SHORT TERM GOAL #2  Title Given verbal or visual prompt, pt will correctly ID written word/object/icon in field of 4-6 to communicate wants/needs with 75% accuracy given occasional mod A over 2 sessions    Baseline 05-04-21, 05-18-21    Status Achieved      SLP SHORT TERM GOAL #3   Title Pt will use multimodal/AAC to ID biographical information and personal interests given 2-6 choices with occasional mod A over 2 sessions    Baseline 05-18-21, 05-25-21    Status Achieved      SLP SHORT TERM GOAL #4   Title Pt will verbally approximate 3  significant words for 5/10 trials given occasional fading to rare mod A over 2 sessions    Baseline 05-20-21, 05-25-21    Status Achieved              SLP Long Term Goals - 06/08/21 1446       SLP LONG TERM GOAL #1   Title Pt will spontaneously use multimodal communication system for 4/5 opportunities to increase ability to express wants/needs given >2 choices over 2 sessions    Time 6    Period Weeks    Status On-going      SLP LONG TERM GOAL #2   Title Pt will ID 80% of paraphasias/perseverations in a session and attempt to self-correct given occasional min A over 2 sessions    Time 6    Period Weeks    Status On-going      SLP LONG TERM GOAL #3   Title Caregiver will appropriate cue patient when apraxic/aphasic errors occurs for 4/5 opportunities over 2 sessions    Time 6    Period Weeks    Status On-going      SLP LONG TERM GOAL #4   Title Pt/daughter will report improved communication effectiveness via PROM by 2 points by last ST session    Time 6    Period Weeks    Status On-going              Plan - 06/08/21 1445     Clinical Impression Statement Lindsey [name] presents for OPST intervention to address 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 SLP provided education and training of trial Lingraphica device this session. Pt and her daughter verbalized understanding with initial education and training on how to operate and modify device. Device provided and taken with patient and daughter this session (30 day trial begins today). Ongoing education and training of Lingraphica device training warranted to maximize usage as communication aid. Skilled ST is recommended to address aphasia and apraxia impacting pt's abilty to communicate effecitvely with both familiar and unfamiliar listeners to convey wants/needs, safety concerns, and participate in social situations.    Speech Therapy Frequency 2x / week    Duration 12 weeks    Treatment/Interventions Compensatory  strategies;Patient/family education;Functional tasks;Cueing hierarchy;Environmental controls;Cognitive reorganization;Multimodal communcation approach;Language facilitation;Compensatory techniques;Internal/external aids;SLP instruction and feedback;Other (comment)    Potential to Achieve Goals Fair    Potential Considerations Severity of impairments;Previous level of function    Consulted and Agree with Plan of Care Patient;Family member/caregiver             Patient will benefit from skilled therapeutic intervention in order to improve the following deficits and impairments:   Aphasia  Verbal apraxia    Problem List Patient Active Problem List   Diagnosis Date Noted   Pain due to onychomycosis of toenails of both feet 11/19/2020   Anemia 05/31/2020   Acute ischemic left MCA  stroke ([place]) 04/30/2020   Right hemiplegia ([place]) 04/30/2020   CVA (cerebral vascular accident) ([place]) 04/21/2020   Unspecified atrial fibrillation ([place]) 04/21/2020   Essential hypertension 04/21/2020   Alcohol abuse 04/21/2020    Alinda Deem, MA CCC-SLP 06/08/2021, 6:19 PM  [facility] [address] [place], [place], [address] Phone: [phone]   Fax:  [phone]   Name: Lindsey [name] MRN: [id number] Date of Birth: October 13, 1955

## 2021-06-08 NOTE — Therapy (Signed)
[facility] [address] [place], [place], [address] Phone: [phone]   Fax:  [phone]  Physical Therapy Treatment  Patient Details  Name: Lindsey [name] MRN: [id number] Date of Birth: 08-Dec-1955 Referring Provider (PT): Izora Ribas, MD   Encounter Date: 06/08/2021   PT End of Session - 06/08/21 1729     Visit Number 9    Number of Visits 17    Date for PT Re-Evaluation 06/04/21    Authorization Type MCR/MCD    Authorization - Visit Number 9    Authorization - Number of Visits 9    Progress Note Due on Visit 10    PT Start Time 3343    PT Stop Time 1700    PT Time Calculation (min) 45 min    Equipment Utilized During Treatment Gait belt    Activity Tolerance Patient tolerated treatment well    Behavior During Therapy [facility] for tasks assessed/performed             Past Medical History:  Diagnosis Date   Diverticulosis    Hypertension    Uterine prolapse     History reviewed. No pertinent surgical history.  There were no vitals filed for this visit.   Subjective Assessment - 06/08/21 1619     Subjective No changes to meds or falls to report    Patient is accompained by: Family member    Pertinent History HTN, Alcohol Abuse    Limitations Standing;Walking    How long can you sit comfortably? unlimited    How long can you stand comfortably? <5 min    How long can you walk comfortably? <22mn    Patient Stated Goals Be able to walk    Pain Onset More than a month ago                [facility]       Dynamic Gait Index   Level Surface Moderate Impairment    Change in Gait Speed Moderate Impairment    Gait with Horizontal Head Turns Moderate Impairment    Gait with Vertical Head Turns Moderate Impairment    Gait and Pivot Turn Severe Impairment    Step Over Obstacle Severe Impairment    Step Around Obstacles Moderate Impairment    Steps Moderate Impairment     Total Score 6    DGI comment: used SPC                           [facility] Adult PT Treatment/Exercise - 06/08/21 0001       Transfers   Transfers Sit to SBank of AmericaTransfers    Sit to Stand 5: Supervision    Sit to Stand Details Verbal cues for sequencing    Stand to Sit 5: Supervision    Stand to Sit Details (indicate cue type and reason) Verbal cues for sequencing    Number of Reps 10 reps    Comments performed FWD WS prior to transfer      Ambulation/Gait   Ambulation/Gait Yes    Ambulation/Gait Assistance 4: Min guard    Ambulation Distance (Feet) 230 Feet   2 trials   Assistive device Straight cane    Gait Pattern Step-to pattern;Step-through pattern;Trunk flexed    Ambulation Surface Level;Indoor                 Balance Exercises - 06/08/21 0001  Balance Exercises: Standing   Stepping Strategy Anterior;Posterior;UE support    Stepping Strategy Limitations Stepping fwd and lateral over yardstick, cane for support, 10 steps per leg ea. direction    Step Ups Forward;2 inch;UE support 1;Limitations;Lateral    Step Ups Limitations standing with cane , stepping onto 2" blaock with WS      Balance Exercises: Seated   Other Seated Exercises retrieving 2.2# ball from floor 10x to facilitate FWD WS             Upper Extremity Functional Index Score :   /80     PT Short Term Goals - 06/08/21 1629       PT SHORT TERM GOAL #1   Title Patient to demo I in initial HEP    Baseline TBD; 04/19/21 Reviewed and added to today.    Time 4    Period Weeks    Status Partially Met    Target Date 05/07/21      PT SHORT TERM GOAL #2   Title Patient will be able to ambulate with HW and SBA x 230 ft to demonstrate improved household mobility    Baseline 150f with RW and CGA/MinA; 05/11/21 Ambulation of 1115f2 with hemiwalker and CGA    Time 4    Period Weeks    Status Revised    Target Date 05/07/21      PT SHORT TERM GOAL #3   Title  Patient to transfer to all positions with CGA    Baseline MnA/CGA [facility] sit/stand with CGA to SBA    Time 4    Period Weeks    Status Partially Met    Target Date 05/07/21      PT SHORT TERM GOAL #4   Title Patient to negotiate 4 steps with L rail with MinA and step to pattern    Baseline TBD; 05/20/21 Patient able to negotiate 4 steps with step to pattern and L rail under SBA    Time 4    Period Days    Status Achieved    Target Date 05/07/21      PT SHORT TERM GOAL #5   Title Perform DGI and set goal; 06/08/21 Goal is 12    Baseline TBD; 06/08/21 DGI score 6, assessed with SPC and CGA    Time 4    Period Weeks    Status Achieved    Target Date 05/07/21      PT SHORT TERM GOAL #6   Title assess gait velocity and set goal    Baseline TBD    Time 4    Period Days    Status New    Target Date 05/07/21               PT Long Term Goals - 04/02/21 0820       PT LONG TERM GOAL #1   Title Patient will be independent with final HEP and walking program within the home with caregiver assistance if needed    Baseline TBD based on previous HEP    Time 8   th visit   Period Weeks    Status New    Target Date 06/04/21      PT LONG TERM GOAL #2   Title Patient to perform all transfers with SBA( except floor transfers)    Baseline CGA/MinA for STS transfers    Time 8    Period Weeks    Status New  Target Date 06/04/21      PT LONG TERM GOAL #3   Title Patient to ambulate 525f with RW, R AFO with SBA    Baseline 1063fin clinic with RW and R AFO using CGA.MinA    Time 8    Period Weeks    Status New    Target Date 06/04/21      PT LONG TERM GOAL #4   Title Assess progress towards gait speed    Baseline TBD    Time 8    Period Weeks    Status New    Target Date 06/04/21      PT LONG TERM GOAL #5   Title Assess progress towards DGI    Baseline TBD    Time 8    Period Weeks    Status New    Target Date 06/04/21                    Plan - 06/08/21 1730     Clinical Impression Statement Focus today was ambulation with cane, 11523f2 with CGA encouraging step length increase on R and more upright posture not focused on ground.  Continued single leg stepping tasks and step ups and lateral steps to R, slightly more hip/knee flexion observed.  Daughter present and reviewed transfers with her as patient cane transfer herself under S.  DGI assess wih cane with score of 6 noted.    Personal Factors and Comorbidities Comorbidity 2    Comorbidities HTN, Alcohol Abuse, Diverticulosis    Examination-Activity Limitations Bed Mobility;Bathing;Sit;Transfers;Dressing;Stairs;Stand;Locomotion Level;Toileting    Examination-Participation Restrictions Occupation;Community Activity;Driving    Stability/Clinical Decision Making Evolving/Moderate complexity    Rehab Potential Good    PT Frequency 2x / week    PT Duration 8 weeks   1 week - last authorized Medicaid visit   PT Treatment/Interventions ADLs/Self Care Home Management;Electrical Stimulation;DME Instruction;Gait training;Stair training;Therapeutic activities;Functional mobility training;Therapeutic exercise;Balance training;Neuromuscular re-education;Patient/family education;Orthotic Fit/Training;Wheelchair mobility training;Manual techniques;Passive range of motion    PT Next Visit Plan R hip flexion strengthening and stepping tasks, continue to facilitate RLE stepping motions and weight shifting, 10th visit progress note    PT Home Exercise Plan Access Code: [email or web address] Consulted and Agree with Plan of Care Patient;Family member/caregiver    Family Member Consulted Daughter             Patient will benefit from skilled therapeutic intervention in order to improve the following deficits and impairments:  Abnormal gait, Decreased balance, Decreased mobility, Decreased endurance, Difficulty walking, Impaired sensation, Impaired UE functional use,  Decreased strength, Decreased safety awareness, Decreased knowledge of use of DME, Decreased activity tolerance, Decreased range of motion, Impaired tone, Improper body mechanics  Visit Diagnosis: 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one: [phone]:  [phone]: ColYolande SkodaN: [id number]  of Birth: September 08, 1956

## 2021-06-10 ENCOUNTER — Ambulatory Visit: Payer: Medicare Other | Admitting: Occupational Therapy

## 2021-06-10 ENCOUNTER — Ambulatory Visit: Payer: Medicare Other

## 2021-06-10 ENCOUNTER — Other Ambulatory Visit: Payer: Self-pay

## 2021-06-10 DIAGNOSIS — R482 Apraxia: Secondary | ICD-10-CM

## 2021-06-10 DIAGNOSIS — R2681 Unsteadiness on feet: Secondary | ICD-10-CM

## 2021-06-10 DIAGNOSIS — R278 Other lack of coordination: Secondary | ICD-10-CM

## 2021-06-10 DIAGNOSIS — R4701 Aphasia: Secondary | ICD-10-CM

## 2021-06-10 DIAGNOSIS — I69851 Hemiplegia and hemiparesis following other cerebrovascular disease affecting right dominant side: Secondary | ICD-10-CM

## 2021-06-10 DIAGNOSIS — R41840 Attention and concentration deficit: Secondary | ICD-10-CM

## 2021-06-10 DIAGNOSIS — M6281 Muscle weakness (generalized): Secondary | ICD-10-CM

## 2021-06-10 DIAGNOSIS — R41842 Visuospatial deficit: Secondary | ICD-10-CM

## 2021-06-10 DIAGNOSIS — M25611 Stiffness of right shoulder, not elsewhere classified: Secondary | ICD-10-CM

## 2021-06-10 NOTE — Therapy (Signed)
[facility] [address] [place], [place], [address] Phone: [phone]   Fax:  [phone]  Speech Language Pathology Treatment  Patient Details  Name: Lindsey [name] MRN: [id number] Date of Birth: 02/25/1956 Referring Provider (SLP): Carlis Abbott Drema Pry, MD   Encounter Date: 06/10/2021   End of Session - 06/10/21 1446     Visit Number 11    Number of Visits 25    Date for SLP Re-Evaluation 06/24/21    Authorization Type Medicare    SLP Start Time 1447    SLP Stop Time  1530    SLP Time Calculation (min) 43 min    Activity Tolerance Patient tolerated treatment well             Past Medical History:  Diagnosis Date   Diverticulosis    Hypertension    Uterine prolapse     History reviewed. No pertinent surgical history.  There were no vitals filed for this visit.   Subjective Assessment - 06/10/21 1447     Subjective "good" re: Lingraphica trial    Patient is accompained by: Family member   daughter, [name]   Currently in Pain? No/denies                   ADULT SLP TREATMENT - 06/10/21 1446       General Information   Behavior/Cognition Alert;Cooperative;Pleasant mood      Treatment Provided   Treatment provided Cognitive-Linquistic      Cognitive-Linquistic Treatment   Treatment focused on Aphasia;Apraxia;Patient/family/caregiver education    Skilled Treatment Pt returned with Lingraphica trial device. Some modifications implemented by daughter per last training session. SLP provided ongoing education and training with patient and daughter for Lingraphica trial device. SLP instructed and demonstrated how to modify and optimize pt's device to include phrase starters and personalize for preferred choices. SLP suggested daily usage of device to facilitate increased user understanding and navigation. Pt and her daughter verbalized understanding of new modifications. Pt exhibited improvements  in comprehension of how to operate and utilize device with occasional cues required. Intermittent mod A cues required for accurate speech sound production at word/phrase level, as speech occasionally impacted by perseverations and apraxic errors.      Assessment / Recommendations / Plan   Plan Continue with current plan of care      Progression Toward Goals   Progression toward goals Progressing toward goals              SLP Education - 06/10/21 1537     Education Details sentence starters, how to communicate via device    Person(s) Educated Patient;Child(ren)    Methods Explanation;Demonstration    Comprehension Verbalized understanding;Returned demonstration;Need further instruction              SLP Short Term Goals - 05/25/21 1522       SLP SHORT TERM GOAL #1   Title Pt will ID paraphasias/perseverations and attempt to correct errors with pause and retrial for 4/5 opportunities given occasional mod A over 2 sessions    Baseline 05-04-21, 05-20-21    Status Achieved      SLP SHORT TERM GOAL #2   Title Given verbal or visual prompt, pt will correctly ID written word/object/icon in field of 4-6 to communicate wants/needs with 75% accuracy given occasional mod A over 2 sessions    Baseline 05-04-21, 05-18-21    Status Achieved      SLP SHORT TERM GOAL #3  Title Pt will use multimodal/AAC to ID biographical information and personal interests given 2-6 choices with occasional mod A over 2 sessions    Baseline 05-18-21, 05-25-21    Status Achieved      SLP SHORT TERM GOAL #4   Title Pt will verbally approximate 3 significant words for 5/10 trials given occasional fading to rare mod A over 2 sessions    Baseline 05-20-21, 05-25-21    Status Achieved              SLP Long Term Goals - 06/10/21 1447       SLP LONG TERM GOAL #1   Title Pt will spontaneously use multimodal communication system for 4/5 opportunities to increase ability to express wants/needs given >2 choices  over 2 sessions    Time 6    Period Weeks    Status On-going      SLP LONG TERM GOAL #2   Title Pt will ID 80% of paraphasias/perseverations in a session and attempt to self-correct given occasional min A over 2 sessions    Time 6    Period Weeks    Status On-going      SLP LONG TERM GOAL #3   Title Caregiver will appropriate cue patient when apraxic/aphasic errors occurs for 4/5 opportunities over 2 sessions    Time 6    Period Weeks    Status On-going      SLP LONG TERM GOAL #4   Title Pt/daughter will report improved communication effectiveness via PROM by 2 points by last ST session    Time 6    Period Weeks    Status On-going              Plan - 06/10/21 1537     Clinical Impression Statement Lawsyn presents for OPST intervention to address 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 SLP provided ongoing education and training of trial Lingraphica device this session. Pt and her daughter verbalized understanding with new training conducted to further personalize and modify device to customize device for preferences and elicit verbal statements/requests. Further education and training of Lingraphica device training warranted to maximize usage as communication aid. Skilled ST is recommended to address aphasia and apraxia impacting pt's abilty to communicate effecitvely with both familiar and unfamiliar listeners to convey wants/needs, safety concerns, and participate in social situations.    Speech Therapy Frequency 2x / week    Duration 12 weeks    Treatment/Interventions Compensatory strategies;Patient/family education;Functional tasks;Cueing hierarchy;Environmental controls;Cognitive reorganization;Multimodal communcation approach;Language facilitation;Compensatory techniques;Internal/external aids;SLP instruction and feedback;Other (comment)    Potential to Achieve Goals Fair    Potential Considerations Severity of impairments;Previous level of function    SLP Home  Exercise Plan provided    Consulted and Agree with Plan of Care Patient;Family member/caregiver    Family Member Consulted daughter             Patient will benefit from skilled therapeutic intervention in order to improve the following deficits and impairments:   Aphasia  Verbal apraxia    Problem List Patient Active Problem List   Diagnosis Date Noted   Pain due to onychomycosis of toenails of both feet 11/19/2020   Anemia 05/31/2020   Acute ischemic left MCA stroke (HCC) 04/30/2020   Right hemiplegia (HCC) 04/30/2020   CVA (cerebral vascular accident) (HCC) 04/21/2020   Unspecified atrial fibrillation (HCC) 04/21/2020   Essential hypertension 04/21/2020   Alcohol abuse 04/21/2020    Janann Colonel, MA CCC-SLP 06/10/2021, 3:45  PM  [facility] [address] [place], [place], [address] Phone: [phone]   Fax:  [phone]   Name: Brigitt Mcclish MRN: [id number] Date of Birth: [birth year]

## 2021-06-10 NOTE — Therapy (Signed)
[facility] [address] [place], [place], [address] Phone: [phone]   Fax:  [phone]  Physical Therapy Treatment/10th Visit Progress Note  Patient Details  Name: Lindsey [name] MRN: [id number] Date of Birth: 1956/07/30 Referring Provider (PT): Izora Ribas, MD   Encounter Date: 06/10/2021     Dates of Reporting Period:04/01/21-06/10/21  See Note below for Objective Data and Assessment of Progress/Goals.     PT End of Session - 06/10/21 1629     Visit Number 10    Number of Visits 17    Date for PT Re-Evaluation 06/04/21    Authorization Type MCR/MCD    Authorization - Visit Number 17    Progress Note Due on Visit 10    PT Start Time 7858    PT Stop Time 1700    PT Time Calculation (min) 45 min    Equipment Utilized During Treatment Gait belt    Activity Tolerance Patient tolerated treatment well    Behavior During Therapy WFL for tasks assessed/performed             Past Medical History:  Diagnosis Date   Diverticulosis    Hypertension    Uterine prolapse     History reviewed. No pertinent surgical history.  There were no vitals filed for this visit.   Subjective Assessment - 06/10/21 1735     Subjective No changes since last session    Patient is accompained by: Family member    Pertinent History HTN, Alcohol Abuse    Limitations Standing;Walking    How long can you sit comfortably? unlimited    How long can you stand comfortably? <5 min    How long can you walk comfortably? <54min    Patient Stated Goals Be able to walk    Pain Onset More than a month ago                [facility] PT Assessment - 06/10/21 0001       Transfers   Transfers Sit to Stand;Stand to Sit;Stand Pivot Transfers    Sit to Stand 5: Supervision;4: Min guard    Sit to Stand Details Verbal cues for sequencing    Stand to Sit 5: Supervision    Stand to Sit Details (indicate cue type and reason) Verbal  cues for sequencing    Number of Reps 10 reps    Comments PT did not allow L knee to brace against table      Ambulation/Gait   Ambulation/Gait Yes    Ambulation/Gait Assistance 4: Min guard    Ambulation Distance (Feet) 230 Feet    Assistive device Hemi-walker    Gait Pattern Step-to pattern;Step-through pattern;Trunk flexed    Ambulation Surface Level;Indoor    Gait Comments continued ti cue for increased L step length.                                Balance Exercises - 06/10/21 0001       Balance Exercises: Standing   Step Ups Forward;2 inch;UE support 1;Limitations;Lateral    Step Ups Limitations standing with HW , stepping onto 2" block with WS                PT Education - 06/10/21 1600     Education Details Instructed daughter to allow patient to transfer w/o CG assist, dmoed her abilityto transfer STS with S    Person(s)  Educated Patient;Child(ren)    Methods Explanation;Demonstration    Comprehension Verbalized understanding;Returned demonstration              PT Short Term Goals - 06/10/21 1630       PT SHORT TERM GOAL #1   Title Patient to demo I in initial HEP    Baseline TBD; 04/19/21 Reviewed and added to today.    Time 4    Period Weeks    Status Partially Met    Target Date 05/07/21      PT SHORT TERM GOAL #2   Title Patient will be able to ambulate with HW and SBA x 230 ft to demonstrate improved household mobility    Baseline 186ft with RW and CGA/MinA; 05/11/21 Ambulation of 141ftx2 with hemiwalker and CGA; 06/10/21 Able to ambulate 248ft with HW and CGA    Time 4    Period Weeks    Status Partially Met    Target Date 05/07/21      PT SHORT TERM GOAL #3   Title Patient to transfer to all positions with CGA    Baseline MnA/CGA for STS transfer from [facility]; Patient able to transfer sit/stand with CGA to SBA;06/10/21 Patient able to stand pivot transfer from mat to [facility] and back with SBA    Time 4    Period Weeks    Status  Achieved    Target Date 05/07/21      PT SHORT TERM GOAL #4   Title Patient to negotiate 4 steps with L rail with MinA and step to pattern    Baseline TBD; 05/20/21 Patient able to negotiate 4 steps with step to pattern and L rail under SBA; 06/10/21 SBA with step to pattern with L rail    Time 4    Period Days    Status Achieved    Target Date 05/07/21      PT SHORT TERM GOAL #5   Title Perform DGI and set goal; 06/08/21 Goal is 12    Baseline TBD; 06/08/21 DGI score 6, assessed with SPC and CGA    Time 4    Period Weeks    Status Achieved    Target Date 05/07/21      PT SHORT TERM GOAL #6   Title assess gait velocity and set goal; 06/10/21 Goal is 0.20 m/s    Baseline TBD; 06/10/21 Gait speed 0.14 m/s    Time 4    Period Days    Status New    Target Date 05/07/21               PT Long Term Goals - 04/02/21 0820       PT LONG TERM GOAL #1   Title Patient will be independent with final HEP and walking program within the home with caregiver assistance if needed    Baseline TBD based on previous HEP    Time 8   th visit   Period Weeks    Status New    Target Date 06/04/21      PT LONG TERM GOAL #2   Title Patient to perform all transfers with SBA( except floor transfers)    Baseline CGA/MinA for STS transfers    Time 8    Period Weeks    Status New    Target Date 06/04/21      PT LONG TERM GOAL #3   Title Patient to ambulate 536ft with RW, R AFO with SBA    Baseline 126ft in  clinic with RW and R AFO using CGA.MinA    Time 8    Period Weeks    Status New    Target Date 06/04/21      PT LONG TERM GOAL #4   Title Assess progress towards gait speed    Baseline TBD    Time 8    Period Weeks    Status New    Target Date 06/04/21      PT LONG TERM GOAL #5   Title Assess progress towards DGI    Baseline TBD    Time 8    Period Weeks    Status New    Target Date 06/04/21                   Plan - 06/10/21 1754     Clinical Impression Statement Todays  session serves as a 10th visit progress note.  Patient has met goal of negotiating 4 steps with SBA, ambulating 226f with a HW and is transferring with SBA.  CGs ahve been instructed to allow patient to transfer w/o physical assist but be present.  She continues to demo a fwd flexed posture and reverts from a step through gait when cued to a step to gait.  Cuing needed for postural correction as well as patients flexed posture transfers weight to forefeet and effects balance and ability to clear feet and avoid toe scuff.  R hip and knee flexion sterngth remains limited due to extensor tone, limiting her ability to step up.    Personal Factors and Comorbidities Comorbidity 2    Comorbidities HTN, Alcohol Abuse, Diverticulosis    Examination-Activity Limitations Bed Mobility;Bathing;Sit;Transfers;Dressing;Stairs;Stand;Locomotion Level;Toileting    Examination-Participation Restrictions Occupation;Community Activity;Driving    Stability/Clinical Decision Making Evolving/Moderate complexity    Rehab Potential Good    PT Frequency 2x / week    PT Treatment/Interventions ADLs/Self Care Home Management;Electrical Stimulation;DME Instruction;Gait training;Stair training;Therapeutic activities;Functional mobility training;Therapeutic exercise;Balance training;Neuromuscular re-education;Patient/family education;Orthotic Fit/Training;Wheelchair mobility training;Manual techniques;Passive range of motion    PT Next Visit Plan R hip flexion strengthening and stepping tasks, continue to facilitate RLE stepping motions and weight shifting, increase L step length    PT Home Exercise Plan Access Code: [email or web address]   Consulted and Agree with Plan of Care Patient;Family member/caregiver    Family Member Consulted Daughter             Patient will benefit from skilled therapeutic intervention in order to improve the following deficits and impairments:  Abnormal gait, Decreased balance, Decreased mobility, Decreased  endurance, Difficulty walking, Impaired sensation, Impaired UE functional use, Decreased strength, Decreased safety awareness, Decreased knowledge of use of DME, Decreased activity tolerance, Decreased range of motion, Impaired tone, Improper body mechanics  Visit Diagnosis: Muscle weakness (generalized)  Unsteadiness on feet  Hemiplegia and hemiparesis following other cerebrovascular disease affecting right dominant side ([facility]     Problem List Patient Active Problem List   Diagnosis Date Noted   Pain due to onychomycosis of toenails of both feet 11/19/2020   Anemia 05/31/2020   [place] 04/30/2020   [facility] 04/30/[place]   CVA (cerebral vascular accident) ([facility] 04/21/2020   Unspecified atrial fibrillation ([facility] 04/21/2020   Essential hypertension 04/21/2020   Alcohol abuse 04/21/2020    JLanice Shirts PT 06/10/2021, 6:05 PM  [address] [address]Phone: [phone]  Fax:  [phone] Name: Lindsey ModeMRN: [id number]  Date of Birth: 1956-09-07

## 2021-06-10 NOTE — Therapy (Signed)
[facility] [address] [place], [place], [address] Phone: [phone]   Fax:  [phone]  Occupational Therapy Treatment  Patient Details  Name: Lindsey [name] MRN: [id number] Date of Birth: 01/12/1956 Referring Provider (OT): Krutika Raulkar, MD   Encounter Date: 06/10/2021   OT End of Session - 06/10/21 1622     Visit Number 12    Number of Visits 24    Date for OT Re-Evaluation 06/24/21    Authorization Type Medicare/ Medicaid    Authorization Time Period Pt has used all MCD visits    Authorization - Visit Number 11    Progress Note Due on Visit 20    OT Start Time 1535    OT Stop Time 1615    OT Time Calculation (min) 40 min             Past Medical History:  Diagnosis Date   Diverticulosis    Hypertension    Uterine prolapse     No past surgical history on file.  There were no vitals filed for this visit.   Subjective Assessment - 06/10/21 1622     Subjective  Pt denies pain    Pertinent History PMH significant for HTN, EtOH abuse, diverticulosis, CVA, July 2021, Botox June 2021    Limitations Fall Risk. Not Driving. Aphasia. Sensory deficits RUE. Apraxia.    Patient Stated Goals trying to get better    Currently in Pain? No/denies                 Treatment: Seated on low mat, pt doffed shoe and brace. Pt was able to donn AFO with supervision, mod A donning shoe due to spasticity. Seated donning shorts then sit to stand to pull them up win minguard. Sidelying on mat gentle P/ROM to elbow and forearm, seated self stretch reach to the floor for shoulder flexion. Weightbearing through right elbow for spasticity management. Pt demonstrates decreased tightness in RUE end of session.                   OT Short Term Goals - 06/08/21 1800       OT SHORT TERM GOAL #1   Title Pt and caregiver will be independent with updated HEP    Baseline not issued yet    Time 4     Period Weeks    Status Achieved      OT SHORT TERM GOAL #2   Title Pt will donn pants with min A    Baseline not reviewed/issued    Time 4    Status Achieved      OT SHORT TERM GOAL #3   Title Pt will consistently demonstrate 30* active elbow flexion  in prep for  functional use.    Baseline trace elbow flexion    Time 4    Period Weeks    Status Not Met   Has ~15 degrees of active flexion inconsistently following facilitation     OT SHORT TERM GOAL #4   Title Pt consistently demonstrate 30 wrist extension in prep for functional use of RUE.    Baseline trace wrist extension    Time 4    Period Weeks    Status Achieved      OT SHORT TERM GOAL #5   Status Achieved      OT SHORT TERM GOAL #6   Title Pt will demonstrate 90* passive shoulder flexion without pain    Baseline 75*      Time 4    Period Weeks    Status Achieved      OT SHORT TERM GOAL #7   Title I with adapted equipment/ adapted strategies for ADLS(ie rocker knife)    Baseline dependent    Time 4    Period Weeks    Status Achieved   in clinic environment able to use rocker knife              OT Long Term Goals - 06/08/21 1800       OT LONG TERM GOAL #1   Title Pt and caregiver will be independent with updated HEP 06/24/21    Baseline not issued    Period Weeks    Status On-going      OT LONG TERM GOAL #2   Title Pt will demonstrate ability to stand and pull up her pants with close supervision.    Baseline min A for standing, mod A for LB dressing    Time 12    Period Weeks    Status On-going   Able to complete in simulated condition in clinic - needs to try at home with daughter     OT LONG TERM GOAL #3   Title Pt will use RUE as a stabilizer/ gross assist consistently at least 75% of the time during ADLS with min prompts    Baseline uses grossly 50% as stabilizer    Time 12    Period Weeks    Status On-going      OT LONG TERM GOAL #4   Title Pt will perform LB bathing with min A     Baseline mod A    Time 12    Period Weeks    Status On-going      OT LONG TERM GOAL #5   Title Pt will perform simple snack or beverage prep with close supervision.    Baseline not currently performing    Time 12    Period Weeks    Status On-going                   Plan - 06/10/21 1628     Clinical Impression Statement Pt is working toward remaining OT goals for ADLs. Pt is progressing.    OT Occupational Profile and History Problem Focused Assessment - Including review of records relating to presenting problem    Occupational performance deficits (Please refer to evaluation for details): ADL's;IADL's;Leisure    Body Structure / Function / Physical Skills ADL;Decreased knowledge of use of DME;Strength;Gait;GMC;Tone;Pain;Dexterity;Balance;UE functional use;Proprioception;ROM;IADL;Vision;Sensation;Coordination;FMC;Flexibility;Decreased knowledge of precautions    Cognitive Skills Attention;Energy/Drive;Perception;Understand;Sequencing;Problem Solve;Memory;Safety Awareness;Thought    Rehab Potential Good    OT Frequency 2x / week    OT Duration 12 weeks    OT Treatment/Interventions Self-care/ADL training;Moist Heat;DME and/or AE instruction;Balance training;Aquatic Therapy;Therapeutic activities;Cognitive remediation/compensation;Therapeutic exercise;Visual/perceptual remediation/compensation;Passive range of motion;Neuromuscular education;Functional Mobility Training;Cryotherapy;Electrical Stimulation;Energy conservation;Manual Therapy;Patient/family education;Ultrasound;Paraffin;Fluidtherapy;Splinting    Plan continue to address donning pants/ sit-stand, donning brace, NMR    Consulted and Agree with Plan of Care Patient             Patient will benefit from skilled therapeutic intervention in order to improve the following deficits and impairments:   Body Structure / Function / Physical Skills: ADL, Decreased knowledge of use of DME, Strength, Gait, GMC, Tone, Pain,  Dexterity, Balance, UE functional use, Proprioception, ROM, IADL, Vision, Sensation, Coordination, FMC, Flexibility, Decreased knowledge of precautions Cognitive Skills: Attention, Energy/Drive, Perception, Understand, Sequencing, Problem Solve, Memory, Safety Awareness, Thought  Visit Diagnosis: 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)  Stiffness of right shoulder, not elsewhere classified  Visuospatial deficit  Other lack of coordination  Attention and concentration deficit  Muscle weakness (generalized)  Unsteadiness on feet    Problem List Patient Active Problem List   Diagnosis Date Noted   Pain due to onychomycosis of toenails of both feet 11/19/2020   Anemia 05/31/2020   Acute ischemic left MCA stroke (HCC) 04/30/2020   Right hemiplegia (HCC) 04/30/2020   CVA (cerebral vascular accident) (HCC) 04/21/2020   Unspecified atrial fibrillation (HCC) 04/21/2020   Essential hypertension 04/21/2020   Alcohol abuse 04/21/2020    RINE,[name], OT/L 06/10/2021, 4:29 PM  [facility] [address] [place], [place], [address] Phone: [phone]   Fax:  [phone]  Name: Lindsey [name] MRN: [id number] Date of Birth: 03/06/1956  

## 2021-06-11 NOTE — Addendum Note (Signed)
Addended by: Hildred Laser on: 06/11/2021 08:39 AM   Modules accepted: Orders

## 2021-06-15 ENCOUNTER — Ambulatory Visit: Payer: Medicare Other

## 2021-06-15 ENCOUNTER — Ambulatory Visit: Payer: Medicare Other | Admitting: Occupational Therapy

## 2021-06-15 ENCOUNTER — Other Ambulatory Visit: Payer: Self-pay

## 2021-06-15 DIAGNOSIS — I69851 Hemiplegia and hemiparesis following other cerebrovascular disease affecting right dominant side: Secondary | ICD-10-CM

## 2021-06-15 DIAGNOSIS — R278 Other lack of coordination: Secondary | ICD-10-CM

## 2021-06-15 DIAGNOSIS — M6281 Muscle weakness (generalized): Secondary | ICD-10-CM

## 2021-06-15 DIAGNOSIS — R41842 Visuospatial deficit: Secondary | ICD-10-CM

## 2021-06-15 DIAGNOSIS — R41844 Frontal lobe and executive function deficit: Secondary | ICD-10-CM

## 2021-06-15 DIAGNOSIS — R2689 Other abnormalities of gait and mobility: Secondary | ICD-10-CM

## 2021-06-15 DIAGNOSIS — R41840 Attention and concentration deficit: Secondary | ICD-10-CM

## 2021-06-15 DIAGNOSIS — R4701 Aphasia: Secondary | ICD-10-CM

## 2021-06-15 DIAGNOSIS — R2681 Unsteadiness on feet: Secondary | ICD-10-CM

## 2021-06-15 DIAGNOSIS — R482 Apraxia: Secondary | ICD-10-CM

## 2021-06-15 DIAGNOSIS — M25611 Stiffness of right shoulder, not elsewhere classified: Secondary | ICD-10-CM

## 2021-06-15 NOTE — Therapy (Signed)
[facility] [address] [place], [place], [address] Phone: [phone]   Fax:  [phone]  Physical Therapy Treatment  Patient Details  Name: Lindsey [name] MRN: [id number] Date of Birth: 1955-12-16 Referring Provider (PT): Izora Ribas, MD   Encounter Date: 06/15/2021   PT End of Session - 06/15/21 1621     Visit Number 11    Number of Visits 18    Date for PT Re-Evaluation 07/12/21    Authorization Type MCR/MCD    Authorization - Visit Number 17    Progress Note Due on Visit 10    PT Start Time 1620    PT Stop Time 1700    PT Time Calculation (min) 40 min    Equipment Utilized During Treatment Gait belt    Activity Tolerance Patient tolerated treatment well    Behavior During Therapy [facility] for tasks assessed/performed             Past Medical History:  Diagnosis Date   Diverticulosis    Hypertension    Uterine prolapse     History reviewed. No pertinent surgical history.  There were no vitals filed for this visit.   Subjective Assessment - 06/15/21 1621     Subjective Pt denies any new issues.    Patient is accompained by: Family member    Pertinent History HTN, Alcohol Abuse    Limitations Standing;Walking    How long can you sit comfortably? unlimited    How long can you stand comfortably? <5 min    How long can you walk comfortably? <85mn    Patient Stated Goals Be able to walk    Currently in Pain? No/denies    Pain Onset More than a month ago                               [facility] 06/15/21 1623       Transfers   Transfers Sit to Stand;Stand to Sit    Sit to Stand 5: Supervision;4: Min guard    Sit to Stand Details Verbal cues for technique    Stand to Sit 5: Supervision;4: Min guard    Stand to Sit Details (indicate cue type and reason) Verbal cues for technique      Ambulation/Gait   Ambulation/Gait Yes    Ambulation/Gait  Assistance 4: Min guard    Ambulation/Gait Assistance Details Pt ambulated with hemiwalker first. PT provided verbal and tactile cues to shift over right leg and increase left step length. Also cued to increase right foot clearance. Pt did catch right toes at times. Has toe cap but does not go around to rubber stop on front of shoe. Trialed SBQC for 2nd 2 bouts and pt did just as well as with hemiwalker. She is fearful at times. CGA mostly with occasional min assist when catches right toe.    Ambulation Distance (Feet) 125 Feet   115' with SBQC, then 178 with SBQC at end of session again   Assistive device Hemi-walker;Small based quad cane   right AFO   Gait Pattern Step-to pattern;Step-through pattern;Decreased hip/knee flexion - right;Trunk flexed;Poor foot clearance - right    Ambulation Surface Level;Indoor    Gait velocity 42.79 sec=0.240m or 0.76 ft/sec      Neuro Re-ed    Neuro Re-ed Details  Sit to stand from edge of mat x 5 with LUE support with verbal and  tactile cues not to rotate to left then switched to no UE support to further decrease trunk rotation. Verbal cues to lean forward and PT provided some anterior facilitation and prevented posterior pelvic rotation on right with some stabilizing at right knee x 5. Sit to stand x 5 with 2" step under left foot to further facilitate right weight shift with LUE support. Standing with PT stabilizing at right knee lightly and preventing posterior right pelvic rotation: tapping 4" step with LLE x 10 then performed with mirror in front for visual cues with maintaining the left foot on step and trying to leg go of quad cane and stand upright CGA/min assist x 4 reps with 10 sec holds.                       PT Short Term Goals - 06/10/21 1630       PT SHORT TERM GOAL #1   Title Patient to demo I in initial HEP    Baseline TBD; 04/19/21 Reviewed and added to today.    Time 4    Period Weeks    Status Partially Met    Target Date  05/07/21      PT SHORT TERM GOAL #2   Title Patient will be able to ambulate with HW and SBA x 230 ft to demonstrate improved household mobility    Baseline 172f with RW and CGA/MinA; 05/11/21 Ambulation of 1153f2 with hemiwalker and CGA; 06/10/21 Able to ambulate 23020fith HW and CGA    Time 4    Period Weeks    Status Partially Met    Target Date 05/07/21      PT SHORT TERM GOAL #3   Title Patient to transfer to all positions with CGA    Baseline MnA/CGA for STS transfer from WC;[facility] LPatient able to transfer sit/stand with CGA to SBA;06/10/21 Patient able to stand pivot transfer from mat to [facility] back with SBA    Time 4    Period Weeks    Status Achieved    Target Date 05/07/21      PT SHORT TERM GOAL #4   Title Patient to negotiate 4 steps with L rail with MinA and step to pattern    Baseline TBD; 05/20/21 Patient able to negotiate 4 steps with step to pattern and L rail under SBA; 06/10/21 SBA with step to pattern with L rail    Time 4    Period Days    Status Achieved    Target Date 05/07/21      PT SHORT TERM GOAL #5   Title Perform DGI and set goal; 06/08/21 Goal is 12    Baseline TBD; 06/08/21 DGI score 6, assessed with SPC and CGA    Time 4    Period Weeks    Status Achieved    Target Date 05/07/21      PT SHORT TERM GOAL #6   Title assess gait velocity and set goal; 06/10/21 Goal is 0.20 m/s    Baseline TBD; 06/10/21 Gait speed 0.14 m/s    Time 4    Period Days    Status New    Target Date 05/07/21               PT Long Term Goals - 06/15/21 1856       PT LONG TERM GOAL #1   Title Patient will be independent with final HEP and walking program within the home with caregiver assistance  if needed (LTGs due 07/02/21)    Baseline TBD based on previous HEP    Time 4   th visit   Period Weeks    Status New    Target Date 07/02/21      PT LONG TERM GOAL #2   Title Patient to perform all transfers with SBA( except floor transfers)    Baseline CGA/MinA for STS transfers     Time 4    Period Weeks    Status New    Target Date 07/02/21      PT LONG TERM GOAL #3   Title Patient to ambulate 534f with RW, R AFO with SBA    Baseline 1063fin clinic with RW and R AFO using CGA.MinA    Time 4    Period Weeks    Status New    Target Date 07/02/21      PT LONG TERM GOAL #4   Title Pt will increase gait speed from 0.2345mto >0.56m61mor improved household mobility.    Baseline 06/15/21 0.54m/44m Time 4    Period Weeks    Status Revised    Target Date 07/02/21      PT LONG TERM GOAL #5   Title Assess progress towards DGI    Baseline TBD    Time 4    Period Weeks    Status New    Target Date 07/02/21                   Plan - 06/15/21 1907     Clinical Impression Statement PT progressed patient's gait from hemiwalker to SBQC.[facility] but is fearful. Focused on NMR to increase right weight shift with some carryover noted with gait after. Gait speed was assessed and at 0.54m/s47mws decreased safety for household ambulator. Updated goal.    Personal Factors and Comorbidities Comorbidity 2    Comorbidities HTN, Alcohol Abuse, Diverticulosis    Examination-Activity Limitations Bed Mobility;Bathing;Sit;Transfers;Dressing;Stairs;Stand;Locomotion Level;Toileting    Examination-Participation Restrictions Occupation;Community Activity;Driving    Stability/Clinical Decision Making Evolving/Moderate complexity    Rehab Potential Good    PT Frequency 2x / week    PT Duration 4 weeks    PT Treatment/Interventions ADLs/Self Care Home Management;Electrical Stimulation;DME Instruction;Gait training;Stair training;Therapeutic activities;Functional mobility training;Therapeutic exercise;Balance training;Neuromuscular re-education;Patient/family education;Orthotic Fit/Training;Wheelchair mobility training;Manual techniques;Passive range of motion    PT Next Visit Plan R hip flexion strengthening and stepping tasks, continue to facilitate  RLE stepping motions and weight shifting, increase L step length. Decide on appropriate AD that family can get to allow for more gait at home with family. SBQC worked well today. Is there a way to get toe cap extended to front of shoe as seems to catch the rubber tip of shoe?    PT Home Exercise Plan Access Code: [email or web address] and Agree with Plan of Care Patient;Family member/caregiver    Family Member Consulted Daughter             Patient will benefit from skilled therapeutic intervention in order to improve the following deficits and impairments:  Abnormal gait, Decreased balance, Decreased mobility, Decreased endurance, Difficulty walking, Impaired sensation, Impaired UE functional use, Decreased strength, Decreased safety awareness, Decreased knowledge of use of DME, Decreased activity tolerance, Decreased range of motion, Impaired tone, Improper body mechanics  Visit Diagnosis: Muscle weakness (generalized)  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)  Unsteadiness on  feet     Problem List Patient Active Problem List   Diagnosis Date Noted   Pain due to onychomycosis of toenails of both feet 11/19/2020   Anemia 05/31/2020   Acute ischemic left MCA stroke ([place]) 04/30/2020   Right hemiplegia ([place]) 04/30/2020   CVA (cerebral vascular accident) ([place]) 04/21/2020   Unspecified atrial fibrillation ([place]) 04/21/2020   Essential hypertension 04/21/2020   Alcohol abuse 04/21/2020    Electa Sniff, PT, DPT, NCS 06/15/2021, 7:11 PM  [facility] [address] [place], [place], [address] Phone: [phone]   Fax:  [phone]  Name: Lindsey [name] MRN: [id number] Date of Birth: 1956/01/02

## 2021-06-15 NOTE — Therapy (Signed)
[facility] [address] [place], [place], [address] Phone: [phone]   Fax:  [phone]  Speech Language Pathology Treatment  Patient Details  Name: Lindsey [name] MRN: [id number] Date of Birth: Feb 22, 1956 Referring Provider (SLP): Carlis Abbott Drema Pry, MD   Encounter Date: 06/15/2021   End of Session - 06/15/21 1554     Visit Number 12    Number of Visits 25    Date for SLP Re-Evaluation 06/24/21    Authorization Type Medicare    SLP Start Time 1532    SLP Stop Time  1615    SLP Time Calculation (min) 43 min    Activity Tolerance Patient tolerated treatment well             Past Medical History:  Diagnosis Date   Diverticulosis    Hypertension    Uterine prolapse     History reviewed. No pertinent surgical history.  There were no vitals filed for this visit.   Subjective Assessment - 06/15/21 1538     Subjective "in car" re: leaving trial device in car with daughter    Currently in Pain? No/denies                   ADULT SLP TREATMENT - 06/15/21 1539       General Information   Behavior/Cognition Alert;Cooperative;Pleasant mood      Treatment Provided   Treatment provided Cognitive-Linquistic      Cognitive-Linquistic Treatment   Treatment focused on Aphasia;Apraxia;Patient/family/caregiver education    Skilled Treatment Pt arrived alone and trial device was forgotten in car with daughter. SLP attempted to contact daughter, but phone went to voicemail. SLP used clinic device to continue targeting comprehension of icons. Pt exhibited increased comfortability navigating device with minimal assistance. Pt able to ID shops with 40% accuracy, which improved to 60% accuracy on second attempt. Pt exhibits improved error awareness when incorrect icon selected. Increased comprehension persists for pictures versus written words.      Assessment / Recommendations / Plan   Plan Continue with  current plan of care      Progression Toward Goals   Progression toward goals Progressing toward goals              SLP Education - 06/15/21 1553     Education Details continue Lingraphica trials, bring device next session    Person(s) Educated Patient    Methods Explanation;Demonstration;Handout    Comprehension Verbalized understanding;Returned demonstration;Need further instruction              SLP Short Term Goals - 05/25/21 1522       SLP SHORT TERM GOAL #1   Title Pt will ID paraphasias/perseverations and attempt to correct errors with pause and retrial for 4/5 opportunities given occasional mod A over 2 sessions    Baseline 05-04-21, 05-20-21    Status Achieved      SLP SHORT TERM GOAL #2   Title Given verbal or visual prompt, pt will correctly ID written word/object/icon in field of 4-6 to communicate wants/needs with 75% accuracy given occasional mod A over 2 sessions    Baseline 05-04-21, 05-18-21    Status Achieved      SLP SHORT TERM GOAL #3   Title Pt will use multimodal/AAC to ID biographical information and personal interests given 2-6 choices with occasional mod A over 2 sessions    Baseline 05-18-21, 05-25-21    Status Achieved      SLP SHORT TERM  GOAL #4   Title Pt will verbally approximate 3 significant words for 5/10 trials given occasional fading to rare mod A over 2 sessions    Baseline 05-20-21, 05-25-21    Status Achieved              SLP Long Term Goals - 06/15/21 1557       SLP LONG TERM GOAL #1   Title Pt will spontaneously use multimodal communication system for 4/5 opportunities to increase ability to express wants/needs given >2 choices over 2 sessions    Time 5    Period Weeks    Status On-going      SLP LONG TERM GOAL #2   Title Pt will ID 80% of paraphasias/perseverations in a session and attempt to self-correct given occasional min A over 2 sessions    Time 5    Period Weeks    Status On-going      SLP LONG TERM GOAL #3    Title Caregiver will appropriate cue patient when apraxic/aphasic errors occurs for 4/5 opportunities over 2 sessions    Time 5    Period Weeks    Status On-going      SLP LONG TERM GOAL #4   Title Pt/daughter will report improved communication effectiveness via PROM by 2 points by last ST session    Time 5    Period Weeks    Status On-going              Plan - 06/15/21 1554     Clinical Impression Statement Lindsey [name] presents for OPST intervention to address 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 SLP provided ongoing education and training of Lingraphica device this session. Pt exhibits with improving comprehension and usage of communication device. Further education and training of Lingraphica device training warranted to maximize usage as communication aid. Skilled ST is recommended to address aphasia and apraxia impacting pt's abilty to communicate effectively with both familiar and unfamiliar listeners to convey wants/needs, safety concerns, and participate in social situations.    Speech Therapy Frequency 2x / week    Duration 12 weeks    Treatment/Interventions Compensatory strategies;Patient/family education;Functional tasks;Cueing hierarchy;Environmental controls;Cognitive reorganization;Multimodal communcation approach;Language facilitation;Compensatory techniques;Internal/external aids;SLP instruction and feedback;Other (comment)    Potential to Achieve Goals Fair    Potential Considerations Severity of impairments;Previous level of function    SLP Home Exercise Plan provided    Consulted and Agree with Plan of Care Patient             Patient will benefit from skilled therapeutic intervention in order to improve the following deficits and impairments:   Aphasia  Verbal apraxia    Problem List Patient Active Problem List   Diagnosis Date Noted   Pain due to onychomycosis of toenails of both feet 11/19/2020   Anemia 05/31/2020   Acute ischemic left  MCA stroke (HCC) 04/30/2020   Right hemiplegia (HCC) 04/30/2020   CVA (cerebral vascular accident) (HCC) 04/21/2020   Unspecified atrial fibrillation (HCC) 04/21/2020   Essential hypertension 04/21/2020   Alcohol abuse 04/21/2020    Janann Colonel, MA CCC-SLP 06/15/2021, 5:51 PM  [facility] [address] [place], [place], [address] Phone: [phone]   Fax:  [phone]   Name: Lindsey [name] MRN: [id number] Date of Birth: 12-Jun-1956

## 2021-06-15 NOTE — Therapy (Signed)
[facility] [address] [place], [place], [address] Phone: [phone]   Fax:  [phone]  Occupational Therapy Treatment  Patient Details  Name: Lindsey [name] MRN: [id number] Date of Birth: 27-Jan-1956 Referring Provider (OT): Leeroy Cha, MD   Encounter Date: 06/15/2021   OT End of Session - 06/15/21 1548     Visit Number 13    Number of Visits 24    Date for OT Re-Evaluation 06/24/21    Authorization Type Medicare/ Medicaid    Authorization Time Period Pt has used all MCD visits    Authorization - Visit Number 13    Progress Note Due on Visit 9    OT Start Time 1448    OT Stop Time 1530    OT Time Calculation (min) 42 min             Past Medical History:  Diagnosis Date   Diverticulosis    Hypertension    Uterine prolapse     No past surgical history on file.  There were no vitals filed for this visit.   Subjective Assessment - 06/15/21 1546     Subjective  Pt denies pain    Pertinent History PMH significant for HTN, EtOH abuse, diverticulosis, CVA, July 2021, Botox June 2021    Limitations Fall Risk. Not Driving. Aphasia. Sensory deficits RUE. Apraxia.    Patient Stated Goals trying to get better    Currently in Pain? No/denies                   Treatment:Pt's dtr reports that she would like for patient to be able to transfer to and from [facility] at home mod I Therapist practiced with pt. Multiple times using hemi walker, pt was able to perform with supervision and min v.c she simulated pulling pants up and down and practiced transfer from [facility] to w/c with supervison. Self ROM shoulder flexion reach for floor and trunk rotation, min v.c for UE stretch.                 OT Short Term Goals - 06/08/21 1800       OT SHORT TERM GOAL #1   Title Pt and caregiver will be independent with updated HEP    Baseline not issued yet    Time 4    Period Weeks    Status Achieved       OT SHORT TERM GOAL #2   Title Pt will donn pants with min A    Baseline not reviewed/issued    Time 4    Status Achieved      OT SHORT TERM GOAL #3   Title Pt will consistently demonstrate 30* active elbow flexion  in prep for  functional use.    Baseline trace elbow flexion    Time 4    Period Weeks    Status Not Met   Has ~15 degrees of active flexion inconsistently following facilitation     OT SHORT TERM GOAL #4   Title Pt consistently demonstrate 30 wrist extension in prep for functional use of RUE.    Baseline trace wrist extension    Time 4    Period Weeks    Status Achieved      OT SHORT TERM GOAL #5   Status Achieved      OT SHORT TERM GOAL #6   Title Pt will demonstrate 90* passive shoulder flexion without pain    Baseline 75*  Time 4    Period Weeks    Status Achieved      OT SHORT TERM GOAL #7   Title I with adapted equipment/ adapted strategies for ADLS(ie rocker knife)    Baseline dependent    Time 4    Period Weeks    Status Achieved   in clinic environment able to use rocker knife              OT Long Term Goals - 06/08/21 1800       OT LONG TERM GOAL #1   Title Pt and caregiver will be independent with updated HEP 06/24/21    Baseline not issued    Period Weeks    Status On-going      OT LONG TERM GOAL #2   Title Pt will demonstrate ability to stand and pull up her pants with close supervision.    Baseline min A for standing, mod A for LB dressing    Time 12    Period Weeks    Status On-going   Able to complete in simulated condition in clinic - needs to try at home with daughter     OT LONG TERM GOAL #3   Title Pt will use RUE as a stabilizer/ gross assist consistently at least 75% of the time during ADLS with min prompts    Baseline uses grossly 50% as stabilizer    Time 12    Period Weeks    Status On-going      OT LONG TERM GOAL #4   Title Pt will perform LB bathing with min A    Baseline mod A    Time 12    Period  Weeks    Status On-going      OT LONG TERM GOAL #5   Title Pt will perform simple snack or beverage prep with close supervision.    Baseline not currently performing    Time 12    Period Weeks    Status On-going                   Plan - 06/15/21 1549     Clinical Impression Statement Pt  is progressing overall. She demosntrates improved safety and independence with BSC transfers using hemiwalker.    OT Occupational Profile and History Problem Focused Assessment - Including review of records relating to presenting problem    Occupational performance deficits (Please refer to evaluation for details): ADL's;IADL's;Leisure    Body Structure / Function / Physical Skills ADL;Decreased knowledge of use of DME;Strength;Gait;GMC;Tone;Pain;Dexterity;Balance;UE functional use;Proprioception;ROM;IADL;Vision;Sensation;Coordination;FMC;Flexibility;Decreased knowledge of precautions    Cognitive Skills Attention;Energy/Drive;Perception;Understand;Sequencing;Problem Solve;Memory;Safety Awareness;Thought    Rehab Potential Good    OT Frequency 2x / week    OT Duration 12 weeks    OT Treatment/Interventions Self-care/ADL training;Moist Heat;DME and/or AE instruction;Balance training;Aquatic Therapy;Therapeutic activities;Cognitive remediation/compensation;Therapeutic exercise;Visual/perceptual remediation/compensation;Passive range of motion;Neuromuscular education;Functional Mobility Training;Cryotherapy;Electrical Stimulation;Energy conservation;Manual Therapy;Patient/family education;Ultrasound;Paraffin;Fluidtherapy;Splinting    Plan continue to address donning pants/ sit-stand, toilet transfers with hemi-walker    Consulted and Agree with Plan of Care Patient    Family Member Consulted dtr- only at beginning of session             Patient will benefit from skilled therapeutic intervention in order to improve the following deficits and impairments:   Body Structure / Function / Physical  Skills: ADL, Decreased knowledge of use of DME, Strength, Gait, GMC, Tone, Pain, Dexterity, Balance, UE functional use, Proprioception, ROM, IADL, Vision, Sensation, Coordination, FMC, Flexibility, Decreased  knowledge of precautions Cognitive Skills: Attention, Energy/Drive, Perception, Understand, Sequencing, Problem Solve, Memory, Safety Awareness, Thought     Visit Diagnosis: 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)  Stiffness of right shoulder, not elsewhere classified  Visuospatial deficit  Other lack of coordination  Attention and concentration deficit  Muscle weakness (generalized)  Unsteadiness on feet  Frontal lobe and executive function deficit  Other abnormalities of gait and mobility    Problem List Patient Active Problem List   Diagnosis Date Noted   Pain due to onychomycosis of toenails of both feet 11/19/2020   Anemia 05/31/2020   Acute ischemic left MCA stroke ([place]) 04/30/2020   Right hemiplegia ([place]) 04/30/2020   CVA (cerebral vascular accident) ([place]) 04/21/2020   Unspecified atrial fibrillation ([place]) 04/21/2020   Essential hypertension 04/21/2020   Alcohol abuse 04/21/2020    Nashia Remus, OT/L 06/15/2021, 3:51 PM  [facility] [address] [place], [place], [address] Phone: [phone]   Fax:  [phone]  Name: Sigrid Schwebach MRN: [id number] Date of Birth: [birth year]

## 2021-06-17 ENCOUNTER — Other Ambulatory Visit: Payer: Self-pay

## 2021-06-17 ENCOUNTER — Ambulatory Visit: Payer: Medicare Other | Admitting: Occupational Therapy

## 2021-06-17 ENCOUNTER — Ambulatory Visit: Payer: Medicare Other

## 2021-06-17 ENCOUNTER — Encounter: Payer: Self-pay | Admitting: Occupational Therapy

## 2021-06-17 DIAGNOSIS — R4701 Aphasia: Secondary | ICD-10-CM

## 2021-06-17 DIAGNOSIS — M6281 Muscle weakness (generalized): Secondary | ICD-10-CM

## 2021-06-17 DIAGNOSIS — R2681 Unsteadiness on feet: Secondary | ICD-10-CM

## 2021-06-17 DIAGNOSIS — R2689 Other abnormalities of gait and mobility: Secondary | ICD-10-CM

## 2021-06-17 DIAGNOSIS — R482 Apraxia: Secondary | ICD-10-CM

## 2021-06-17 DIAGNOSIS — R41844 Frontal lobe and executive function deficit: Secondary | ICD-10-CM

## 2021-06-17 DIAGNOSIS — R41840 Attention and concentration deficit: Secondary | ICD-10-CM

## 2021-06-17 DIAGNOSIS — R278 Other lack of coordination: Secondary | ICD-10-CM

## 2021-06-17 DIAGNOSIS — I69851 Hemiplegia and hemiparesis following other cerebrovascular disease affecting right dominant side: Secondary | ICD-10-CM | POA: Diagnosis not present

## 2021-06-17 DIAGNOSIS — R41842 Visuospatial deficit: Secondary | ICD-10-CM

## 2021-06-17 DIAGNOSIS — M25611 Stiffness of right shoulder, not elsewhere classified: Secondary | ICD-10-CM

## 2021-06-17 NOTE — Therapy (Signed)
[facility] [address] [place], [place], [address] Phone: [phone]   Fax:  [phone]  Physical Therapy Treatment  Patient Details  Name: Lindsey [name] MRN: [id number] Date of Birth: Nov 17, 1955 Referring Provider (PT): Izora Ribas, MD   Encounter Date: 06/17/2021   PT End of Session - 06/17/21 1518     Visit Number 12    Number of Visits 18    Date for PT Re-Evaluation 07/12/21    Authorization Type MCR/MCD    Authorization Time Period 06/05/21-07/12/21    Authorization - Visit Number 17    Progress Note Due on Visit 10    PT Start Time 1615    PT Stop Time 1700    PT Time Calculation (min) 45 min    Equipment Utilized During Treatment Gait belt    Activity Tolerance Patient tolerated treatment well    Behavior During Therapy [facility] for tasks assessed/performed             Past Medical History:  Diagnosis Date   Diverticulosis    Hypertension    Uterine prolapse     History reviewed. No pertinent surgical history.  There were no vitals filed for this visit.   Subjective Assessment - 06/17/21 1619     Subjective Denies pain or any medical changes.  No falls to report.  Family has been using HHA for ambuation    Patient is accompained by: Family member    Pertinent History HTN, Alcohol Abuse    Limitations Standing;Walking    How long can you sit comfortably? unlimited    How long can you stand comfortably? <5 min    How long can you walk comfortably? <72mn    Patient Stated Goals Be able to walk    Currently in Pain? No/denies    Pain Onset More than a month ago                               [facility] - 06/17[facility]21 0001       Transfers   Transfers Sit to Stand;Stand to Sit    Sit to Stand 5: Supervision;4: Min guard    Sit to Stand Details Tactile cues for weight shifting;Tactile cues for sequencing;Verbal cues for technique    Stand to Sit  5: Supervision;4: Min guard    Stand to Sit Details (indicate cue type and reason) Verbal cues for technique    Stand Pivot Transfers 4: Min guard    Squat Pivot Transfer Details (indicate cue type and reason) performed from mat w/o AFO to assess ability to [facility] to judge safety and assist needed    Number of Reps 10 reps    Comments No PT assist allowed      Ambulation/Gait   Ambulation/Gait Yes    Ambulation/Gait Assistance 4: Min guard    Ambulation Distance (Feet) 115 Feet    Assistive device Small based quad cane    Gait Pattern Step-to pattern;Step-through pattern;Decreased hip/knee flexion - right;Trunk flexed;Poor foot clearance - right    Ambulation Surface Unlevel;Indoor    Gait Comments 2 bouts of ambulation with cues to maintain upright posture with forward head and flexed posture to avoid excessive WS to forefeet and resultant balance loss      Lumbar Exercises: Seated   Other Seated Lumbar Exercises Picking up/placing ball from floor to facilitate FWD WS and trunk  totation. perfomed from center and L,R of center 10x ea. with 3.3# ball                 Balance Exercises - 06/17/21 0001       Balance Exercises: Standing   Stepping Strategy Anterior;UE support;5 reps;Limitations    Stepping Strategy Limitations stepping fed ove yard stick requiring VCs and TCs to increase step length, only counting successful attempts,                PT Education - 06/17/21 1742     Education Details Discussed need to follow through at home with ambulation using walker until LRAD determined by PT, emphasized need to maintain upright posture to avoid forward WS and LOB    Person(s) Educated Patient;Caregiver(s)    Methods Explanation;Demonstration    Comprehension Verbalized understanding              PT Short Term Goals - 06/10/21 1630       PT SHORT TERM GOAL #1   Title Patient to demo I in initial HEP    Baseline TBD; 04/19/21 Reviewed and  added to today.    Time 4    Period Weeks    Status Partially Met    Target Date 05/07/21      PT SHORT TERM GOAL #2   Title Patient will be able to ambulate with HW and SBA x 230 ft to demonstrate improved household mobility    Baseline 1102f with RW and CGA/MinA; 05/11/21 Ambulation of 119f2 with hemiwalker and CGA; 06/10/21 Able to ambulate 23068fith HW and CGA    Time 4    Period Weeks    Status Partially Met    Target Date 05/07/21      PT SHORT TERM GOAL #3   Title Patient to transfer to all positions with CGA    Baseline MnA/CGA for STS transfer [facility] WC[facility] to transfer sit/stand with CGA to SBA;06/10/21 Patient able to stand pivot transfer [facility] mat [facility]    Time 4    Period Weeks    Status Achieved    Target Date 05/07/21      PT SHORT TERM GOAL #4   Title Patient to negotiate 4 steps with L rail with MinA and step to pattern    Baseline TBD; 05/20/21 Patient able to negotiate 4 steps with step to pattern and L rail under SBA; 06/10/21 SBA with step to pattern with L rail    Time 4    Period Days    Status Achieved    Target Date 05/07/21      PT SHORT TERM GOAL #5   Title Perform DGI and set goal; 06/08/21 Goal is 12    Baseline TBD; 06/08/21 DGI score 6, assessed with SPC and CGA    Time 4    Period Weeks    Status Achieved    Target Date 05/07/21      PT SHORT TERM GOAL #6   Title assess gait velocity and set goal; 06/10/21 Goal is 0.20 m/s    Baseline TBD; 06/10/21 Gait speed 0.14 m/s    Time 4    Period Days    Status New    Target Date 05/07/21               PT Long Term Goals - 06/15/21 1856       PT LONG TERM GOAL #1   Title Patient will  be independent with final HEP and walking program within the home with caregiver assistance if needed (LTGs due 07/02/21)    Baseline TBD based on previous HEP    Time 4   th visit   Period Weeks    Status New    Target Date 07/02/21      PT LONG TERM GOAL #2   Title Patient to perform all  transfers with SBA( except floor transfers)    Baseline CGA/MinA for STS transfers    Time 4    Period Weeks    Status New    Target Date 07/02/21      PT LONG TERM GOAL #3   Title Patient to ambulate 513f with RW, R AFO with SBA    Baseline 1047fin clinic with RW and R AFO using CGA.MinA    Time 4    Period Weeks    Status New    Target Date 07/02/21      PT LONG TERM GOAL #4   Title Pt will increase gait speed from 0.2334mto >0.50m82mor improved household mobility.    Baseline 06/15/21 0.72m/82m Time 4    Period Weeks    Status Revised    Target Date 07/02/21      PT LONG TERM GOAL #5   Title Assess progress towards DGI    Baseline TBD    Time 4    Period Weeks    Status New    Target Date 07/02/21                   Plan - 06/17/21 1800     Clinical Impression Statement Todays session assessed patient ability to peform stand pivot transfer from mat to chair to simulate transfer to [facility], performed w/o R AFO on foam surface to simulate carpet.  MinA required to transfer to affected side CGA needed to L, difficulty noted with problem solving and safety awareness.  Continued gait training with [facility] issues noted maintaining all 4 feet flat and WS too far forward and causing an anterior LOB as well as occasional  foot scuff.  Carry over remains limited b/t session, family instructed to emphasize posture at home    Personal Factors and Comorbidities Comorbidity 2    Comorbidities HTN, Alcohol Abuse, Diverticulosis    Examination-Activity Limitations Bed Mobility;Bathing;Sit;Transfers;Dressing;Stairs;Stand;Locomotion Level;Toileting    Examination-Participation Restrictions Occupation;Community Activity;Driving    Stability/Clinical Decision Making Evolving/Moderate complexity    Rehab Potential Good    PT Frequency 2x / week    PT Duration 4 weeks    PT Treatment/Interventions ADLs/Self Care Home Management;Electrical Stimulation;DME  Instruction;Gait training;Stair training;Therapeutic activities;Functional mobility training;Therapeutic exercise;Balance training;Neuromuscular re-education;Patient/family education;Orthotic Fit/Training;Wheelchair mobility training;Manual techniques;Passive range of motion    PT Next Visit Plan gait and transfer training to determine LRAD, assess follow through with upright posture during gait    PT Home Exercise Plan Access Code: [email or web address] and Agree with Plan of Care Patient;Family member/caregiver    Family Member Consulted Daughter             Patient will benefit from skilled therapeutic intervention in order to improve the following deficits and impairments:  Abnormal gait, Decreased balance, Decreased mobility, Decreased endurance, Difficulty walking, Impaired sensation, Impaired UE functional use, Decreased strength, Decreased safety awareness, Decreased knowledge of use of DME, Decreased activity tolerance, Decreased range of motion, Impaired tone, Improper body mechanics  Visit Diagnosis: Unsteadiness on feet  Muscle weakness (generalized)  Hemiplegia and hemiparesis following other cerebrovascular disease affecting right dominant side [facility])     Problem List Patient Active Problem List   Diagnosis Date Noted   Pain due to onychomycosis of toenails of both feet 11/19/2020   Anemia 05/31/2020   Acute ischemic left MCA stroke ([place]) 04/30/2020   Right hemiplegia ([place]) 04/30/2020   CVA (cerebral vascular accident) ([place]) 04/21/2020   Unspecified atrial fibrillation ([place]) 04/21/2020   Essential hypertension 04/21/2020   Alcohol abuse 04/21/2020    Lanice Shirts, PT 06/17/2021, 6:07 PM  [facility] [address] [place], [place], [address] Phone: [phone]   Fax:  [phone]  Name: Bernedette Auston MRN: [id number] Date of Birth: 20-Mar-1956

## 2021-06-17 NOTE — Therapy (Signed)
[facility] [address] [place], [place], [address] Phone: [phone]   Fax:  [phone]  Occupational Therapy Treatment  Patient Details  Name: Lindsey [name] MRN: [id number] Date of Birth: 11-03-1955 Referring Provider (OT): Leeroy Cha, MD   Encounter Date: 06/17/2021   OT End of Session - 06/17/21 1647     Visit Number 14    Number of Visits 24    Date for OT Re-Evaluation 06/24/21    Authorization Time Period Pt has used all MCD visits    Authorization - Visit Number 14    Progress Note Due on Visit 20    OT Start Time 1534    OT Stop Time 1613    OT Time Calculation (min) 39 min    Activity Tolerance Patient tolerated treatment well    Behavior During Therapy [facility] for tasks assessed/performed             Past Medical History:  Diagnosis Date   Diverticulosis    Hypertension    Uterine prolapse     History reviewed. No pertinent surgical history.  There were no vitals filed for this visit.   Subjective Assessment - 06/17/21 1646     Subjective  Pt denies pain    Pertinent History PMH significant for HTN, EtOH abuse, diverticulosis, CVA, July 2021, Botox June 2021    Patient Stated Goals trying to get better    Currently in Pain? No/denies                Treatment:Pt's dtr reports was present for treatment today. Education regarding BSC transfers with supervision. Therapist practiced with pt. Multiple times using quad cane, pt was able to perform with supervision and min v.c and practiced transfer from [facility] to w/c with supervison. Self ROM shoulder flexion reach for floor and trunk rotation, min v.c for UE stretch. Followed by weightbearing through elbow and passive stretch at wrist.                    OT Education - 06/17/21 1646     Education Details Pt/ dtr were educated in toilet transfers to [facility] with supervision fwith quad cane. Pt performed multiple  repetitions with supervision and min v.c for safety and foot placement.    Person(s) Educated Patient;Child(ren)    Methods Explanation;Demonstration;Verbal cues    Comprehension Returned demonstration;Verbal cues required              OT Short Term Goals - 06/08/21 1800       OT SHORT TERM GOAL #1   Title Pt and caregiver will be independent with updated HEP    Baseline not issued yet    Time 4    Period Weeks    Status Achieved      OT SHORT TERM GOAL #2   Title Pt will donn pants with min A    Baseline not reviewed/issued    Time 4    Status Achieved      OT SHORT TERM GOAL #3   Title Pt will consistently demonstrate 30* active elbow flexion  in prep for  functional use.    Baseline trace elbow flexion    Time 4    Period Weeks    Status Not Met   Has ~15 degrees of active flexion inconsistently following facilitation     OT SHORT TERM GOAL #4   Title Pt consistently demonstrate 30 wrist extension in prep for functional use of RUE.  Baseline trace wrist extension    Time 4    Period Weeks    Status Achieved      OT SHORT TERM GOAL #5   Status Achieved      OT SHORT TERM GOAL #6   Title Pt will demonstrate 90* passive shoulder flexion without pain    Baseline 75*    Time 4    Period Weeks    Status Achieved      OT SHORT TERM GOAL #7   Title I with adapted equipment/ adapted strategies for ADLS(ie rocker knife)    Baseline dependent    Time 4    Period Weeks    Status Achieved   in clinic environment able to use rocker knife              OT Long Term Goals - 06/08/21 1800       OT LONG TERM GOAL #1   Title Pt and caregiver will be independent with updated HEP 06/24/21    Baseline not issued    Period Weeks    Status On-going      OT LONG TERM GOAL #2   Title Pt will demonstrate ability to stand and pull up her pants with close supervision.    Baseline min A for standing, mod A for LB dressing    Time 12    Period Weeks    Status  On-going   Able to complete in simulated condition in clinic - needs to try at home with daughter     OT LONG TERM GOAL #3   Title Pt will use RUE as a stabilizer/ gross assist consistently at least 75% of the time during ADLS with min prompts    Baseline uses grossly 50% as stabilizer    Time 12    Period Weeks    Status On-going      OT LONG TERM GOAL #4   Title Pt will perform LB bathing with min A    Baseline mod A    Time 12    Period Weeks    Status On-going      OT LONG TERM GOAL #5   Title Pt will perform simple snack or beverage prep with close supervision.    Baseline not currently performing    Time 12    Period Weeks    Status On-going                    Patient will benefit from skilled therapeutic intervention in order to improve the following deficits and impairments:           Visit Diagnosis: Muscle weakness (generalized)  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)  Stiffness of right shoulder, not elsewhere classified  Visuospatial deficit  Other lack of coordination  Attention and concentration deficit  Frontal lobe and executive function deficit  Other abnormalities of gait and mobility  Unsteadiness on feet    Problem List Patient Active Problem List   Diagnosis Date Noted   Pain due to onychomycosis of toenails of both feet 11/19/2020   Anemia 05/31/2020   Acute ischemic left MCA stroke ([place]) 04/30/2020   Right hemiplegia ([place]) 04/30/2020   CVA (cerebral vascular accident) ([place]) 04/21/2020   Unspecified atrial fibrillation ([place]) 04/21/2020   Essential hypertension 04/21/2020   Alcohol abuse 04/21/2020    Adonai Helzer, OT/L 06/17/2021, 4:48 PM  [facility] [address] [place],  [place], [address] Phone: [phone]   Fax:  [phone]  Name: Lindsey [name] MRN: [id number] Date of Birth:  Dec 13, 1955

## 2021-06-17 NOTE — Therapy (Signed)
[facility] [address] [place], [place], [address] Phone: [phone]   Fax:  [phone]  Speech Language Pathology Treatment  Patient Details  Name: Lindsey [name] MRN: [id number] Date of Birth: [birth year] Referring Provider (SLP): Carlis Abbott Drema Pry, MD   Encounter Date: 06/17/2021   End of Session - 06/17/21 1634     Visit Number 13    Number of Visits 25    Date for SLP Re-Evaluation 06/24/21    Authorization Type Medicare    SLP Start Time 1445    SLP Stop Time  1530    SLP Time Calculation (min) 45 min    Activity Tolerance Patient tolerated treatment well             Past Medical History:  Diagnosis Date   Diverticulosis    Hypertension    Uterine prolapse     History reviewed. No pertinent surgical history.  There were no vitals filed for this visit.   Subjective Assessment - 06/17/21 1446     Subjective "alright"    Patient is accompained by: Family member   daughter, Lindsey [name]   Currently in Pain? No/denies                   ADULT SLP TREATMENT - 06/17/21 1627       General Information   Behavior/Cognition Alert;Cooperative;Pleasant mood      Treatment Provided   Treatment provided Cognitive-Linquistic      Cognitive-Linquistic Treatment   Treatment focused on Aphasia;Apraxia;Patient/family/caregiver education    Skilled Treatment Lindsey [name] was accompanied by her daughter this session. Pt's daughter continues to ask insightful questions into modification and personalization of device. Pt exhibits excitement to use device and demonstrates increasing comprehension of how to use. This session, SLP modified restaurant page with pages/icons to facilitate patient ability to independently order. Pt exhibits good ability to verbally repeat Lingraphica model with rare mod verbal and visual cues need to correct production this session.      Assessment / Recommendations / Plan   Plan  Continue with current plan of care      Progression Toward Goals   Progression toward goals Progressing toward goals              SLP Education - 06/17/21 1633     Education Details how to modify Lingraphica    Person(s) Educated Patient;Child(ren)    Methods Explanation;Demonstration;Verbal cues    Comprehension Verbalized understanding;Returned demonstration;Verbal cues required;Need further instruction              SLP Short Term Goals - 05/25/21 1522       SLP SHORT TERM GOAL #1   Title Pt will ID paraphasias/perseverations and attempt to correct errors with pause and retrial for 4/5 opportunities given occasional mod A over 2 sessions    Baseline 05-04-21, 05-20-21    Status Achieved      SLP SHORT TERM GOAL #2   Title Given verbal or visual prompt, pt will correctly ID written word/object/icon in field of 4-6 to communicate wants/needs with 75% accuracy given occasional mod A over 2 sessions    Baseline 05-04-21, 05-18-21    Status Achieved      SLP SHORT TERM GOAL #3   Title Pt will use multimodal/AAC to ID biographical information and personal interests given 2-6 choices with occasional mod A over 2 sessions    Baseline 05-18-21, 05-25-21    Status Achieved      SLP SHORT  TERM GOAL #4   Title Pt will verbally approximate 3 significant words for 5/10 trials given occasional fading to rare mod A over 2 sessions    Baseline 05-20-21, 05-25-21    Status Achieved              SLP Long Term Goals - 06/17/21 1635       SLP LONG TERM GOAL #1   Title Pt will spontaneously use multimodal communication system for 4/5 opportunities to increase ability to express wants/needs given >2 choices over 2 sessions    Baseline 06-17-21    Time 5    Period Weeks    Status On-going      SLP LONG TERM GOAL #2   Title Pt will ID 80% of paraphasias/perseverations in a session and attempt to self-correct given occasional min A over 2 sessions    Baseline 06-17-21    Time 5     Period Weeks    Status On-going      SLP LONG TERM GOAL #3   Title Caregiver will appropriate cue patient when apraxic/aphasic errors occurs for 4/5 opportunities over 2 sessions    Time 5    Period Weeks    Status On-going      SLP LONG TERM GOAL #4   Title Pt/daughter will report improved communication effectiveness via PROM by 2 points by last ST session    Time 5    Period Weeks    Status On-going              Plan - 06/17/21 1634     Clinical Impression Statement Lindsey [name] presents for OPST intervention to address 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 SLP provided ongoing education and training of trial Lingraphica device this session with patient and her daughter. SLP demonstrated how to use device for functional communication, including patient independently ordering at a restaurant with use of device. Pt exhibits with improving comprehension and usage of communication device. Further education and training of Lingraphica device training warranted to maximize usage as communication aid. Skilled ST is recommended to address aphasia and apraxia impacting pt's abilty to communicate effectively with both familiar and unfamiliar listeners to convey wants/needs, safety concerns, and participate in social situations.    Speech Therapy Frequency 2x / week    Duration 12 weeks    Treatment/Interventions Compensatory strategies;Patient/family education;Functional tasks;Cueing hierarchy;Environmental controls;Cognitive reorganization;Multimodal communcation approach;Language facilitation;Compensatory techniques;Internal/external aids;SLP instruction and feedback;Other (comment)    Potential to Achieve Goals Fair    Potential Considerations Severity of impairments;Previous level of function    SLP Home Exercise Plan provided    Consulted and Agree with Plan of Care Patient             Patient will benefit from skilled therapeutic intervention in order to improve the  following deficits and impairments:   Aphasia  Verbal apraxia    Problem List Patient Active Problem List   Diagnosis Date Noted   Pain due to onychomycosis of toenails of both feet 11/19/2020   Anemia 05/31/2020   Acute ischemic left MCA stroke (HCC) 04/30/2020   Right hemiplegia (HCC) 04/30/2020   CVA (cerebral vascular accident) (HCC) 04/21/2020   Unspecified atrial fibrillation (HCC) 04/21/2020   Essential hypertension 04/21/2020   Alcohol abuse 04/21/2020    Lindsey Colonel, MA CCC-SLP 06/17/2021, 4:36 PM  [facility] [address] [place], [place], [address] Phone: [phone]   Fax:  [phone]   Name: Lindsey [name]  MRN: [id number] Date of Birth: 08-05-1956

## 2021-06-23 ENCOUNTER — Ambulatory Visit: Payer: Medicare Other

## 2021-06-23 ENCOUNTER — Encounter: Payer: Medicare Other | Admitting: Occupational Therapy

## 2021-06-24 ENCOUNTER — Encounter: Payer: Medicare Other | Admitting: Occupational Therapy

## 2021-06-29 ENCOUNTER — Encounter: Payer: Self-pay | Admitting: Physical Medicine and Rehabilitation

## 2021-06-29 ENCOUNTER — Encounter
Payer: Medicare Other | Attending: Physical Medicine and Rehabilitation | Admitting: Physical Medicine and Rehabilitation

## 2021-06-29 ENCOUNTER — Other Ambulatory Visit: Payer: Self-pay

## 2021-06-29 VITALS — BP 100/57 | HR 76 | Ht 64.0 in | Wt 165.0 lb

## 2021-06-29 DIAGNOSIS — M79601 Pain in right arm: Secondary | ICD-10-CM | POA: Diagnosis not present

## 2021-06-29 DIAGNOSIS — I69398 Other sequelae of cerebral infarction: Secondary | ICD-10-CM | POA: Diagnosis not present

## 2021-06-29 DIAGNOSIS — R252 Cramp and spasm: Secondary | ICD-10-CM | POA: Diagnosis not present

## 2021-06-29 MED ORDER — APIXABAN 5 MG PO TABS
5.0000 mg | ORAL_TABLET | Freq: Two times a day (BID) | ORAL | 0 refills | Status: DC
Start: 2021-06-29 — End: 2021-07-12

## 2021-06-29 MED ORDER — ATORVASTATIN CALCIUM 20 MG PO TABS
20.0000 mg | ORAL_TABLET | Freq: Every day | ORAL | 3 refills | Status: DC
Start: 2021-06-29 — End: 2021-06-29

## 2021-06-29 MED ORDER — APIXABAN 5 MG PO TABS
5.0000 mg | ORAL_TABLET | Freq: Two times a day (BID) | ORAL | 0 refills | Status: DC
Start: 2021-06-29 — End: 2021-06-29

## 2021-06-29 MED ORDER — ATORVASTATIN CALCIUM 20 MG PO TABS
20.0000 mg | ORAL_TABLET | Freq: Every day | ORAL | 3 refills | Status: DC
Start: 2021-06-29 — End: 2023-08-30

## 2021-06-29 NOTE — Addendum Note (Signed)
Addended by: Horton Chin on: 06/29/2021 02:37 PM   Modules accepted: Orders

## 2021-06-29 NOTE — Progress Notes (Signed)
Botox Injection for spasticity, palpation and activation approach used for localization since ultrasound machine was not working   Dilution: 1:1 Indication: Severe spasticity which interferes with ADL,mobility and/or  hygiene and is unresponsive to medication management and other conservative care   Informed consent was obtained after describing risks and benefits of the procedure with the patient. This includes bleeding, bruising, infection, excessive weakness, or medication side effects. A REMS form is on file and signed.  1) Biceps Brachii: 50U divided in 2 sites 2) Flexor digitorum profundus: 20U in 1 site 3) Flexor carpi radialis: 10 U in 1 site 4) Flexor digitorum superficialis: 20U in 1 site     All injections were done after palpation and patient activation of the muscle confirmed the accurate location, and after negative drawback for blood. The patient tolerated the procedure well. Post procedure instructions were given. A followup appointment was made.  

## 2021-07-01 ENCOUNTER — Ambulatory Visit: Payer: Medicare Other

## 2021-07-01 ENCOUNTER — Ambulatory Visit: Payer: Medicare Other | Admitting: Occupational Therapy

## 2021-07-06 ENCOUNTER — Ambulatory Visit: Payer: Medicare Other

## 2021-07-06 ENCOUNTER — Other Ambulatory Visit: Payer: Self-pay

## 2021-07-06 ENCOUNTER — Ambulatory Visit: Payer: Medicare Other | Admitting: Occupational Therapy

## 2021-07-06 ENCOUNTER — Encounter: Payer: Self-pay | Admitting: Occupational Therapy

## 2021-07-06 ENCOUNTER — Ambulatory Visit: Payer: Medicare Other | Attending: Physical Medicine and Rehabilitation

## 2021-07-06 DIAGNOSIS — R2689 Other abnormalities of gait and mobility: Secondary | ICD-10-CM | POA: Insufficient documentation

## 2021-07-06 DIAGNOSIS — I69851 Hemiplegia and hemiparesis following other cerebrovascular disease affecting right dominant side: Secondary | ICD-10-CM

## 2021-07-06 DIAGNOSIS — R41842 Visuospatial deficit: Secondary | ICD-10-CM

## 2021-07-06 DIAGNOSIS — M6281 Muscle weakness (generalized): Secondary | ICD-10-CM | POA: Insufficient documentation

## 2021-07-06 DIAGNOSIS — R2681 Unsteadiness on feet: Secondary | ICD-10-CM | POA: Insufficient documentation

## 2021-07-06 DIAGNOSIS — M25611 Stiffness of right shoulder, not elsewhere classified: Secondary | ICD-10-CM

## 2021-07-06 DIAGNOSIS — R278 Other lack of coordination: Secondary | ICD-10-CM | POA: Insufficient documentation

## 2021-07-06 DIAGNOSIS — R41844 Frontal lobe and executive function deficit: Secondary | ICD-10-CM | POA: Diagnosis present

## 2021-07-06 DIAGNOSIS — R4701 Aphasia: Secondary | ICD-10-CM

## 2021-07-06 DIAGNOSIS — R482 Apraxia: Secondary | ICD-10-CM | POA: Diagnosis present

## 2021-07-06 DIAGNOSIS — R41840 Attention and concentration deficit: Secondary | ICD-10-CM | POA: Insufficient documentation

## 2021-07-06 NOTE — Therapy (Signed)
[facility] [address] [place], [place], [address] Phone: [phone]   Fax:  [phone]  Speech Language Pathology Treatment/Recert  Patient Details  Name: Lindsey [name] MRN: [id number] Date of Birth: Dec 25, 1955 Referring Provider (SLP): Carlis Abbott Drema Pry, MD   Encounter Date: 07/06/2021   End of Session - 07/06/21 1421     Visit Number 14    Number of Visits 25    Date for SLP Re-Evaluation 08/06/21   extended 4 weeks   Authorization Type Medicare    SLP Start Time 1446    SLP Stop Time  1530    SLP Time Calculation (min) 44 min    Activity Tolerance Patient tolerated treatment well             Past Medical History:  Diagnosis Date   Diverticulosis    Hypertension    Uterine prolapse     History reviewed. No pertinent surgical history.  There were no vitals filed for this visit.   Subjective Assessment - 07/06/21 1607     Subjective "I been good"    Currently in Pain? No/denies                   ADULT SLP TREATMENT - 07/06/21 1421       General Information   Behavior/Cognition Alert;Cooperative;Pleasant mood      Treatment Provided   Treatment provided Cognitive-Linquistic      Cognitive-Linquistic Treatment   Treatment focused on Aphasia;Apraxia;Patient/family/caregiver education    Skilled Treatment Pt returned alone to ST after a few weeks. Pt has reportedly been using trial Lingraphica device at home with consistency and accuracy. As trial of Lingraphica device is ending, SLP dicussed recommendation for permanent device. Pt desires for permanent device to augment verbal expression and facilitate communication. Pt able to navigate to various pages and icons with 77% accuracy, which improved to 100% accuracy on second trial. Pt exhibited increased spontaneous verbal expression this session, with rare min to mod A required to correct apraxia errors.      Assessment /  Recommendations / Plan   Plan Continue with current plan of care      Progression Toward Goals   Progression toward goals Progressing toward goals              SLP Education - 07/06/21 1424     Education Details recommendation for permanent Lingraphica device    Person(s) Educated Patient    Methods Explanation;Demonstration    Comprehension Verbalized understanding;Returned demonstration;Need further instruction              SLP Short Term Goals - 05/25/21 1522       SLP SHORT TERM GOAL #1   Title Pt will ID paraphasias/perseverations and attempt to correct errors with pause and retrial for 4/5 opportunities given occasional mod A over 2 sessions    Baseline 05-04-21, 05-20-21    Status Achieved      SLP SHORT TERM GOAL #2   Title Given verbal or visual prompt, pt will correctly ID written word/object/icon in field of 4-6 to communicate wants/needs with 75% accuracy given occasional mod A over 2 sessions    Baseline 05-04-21, 05-18-21    Status Achieved      SLP SHORT TERM GOAL #3   Title Pt will use multimodal/AAC to ID biographical information and personal interests given 2-6 choices with occasional mod A over 2 sessions    Baseline 05-18-21, 05-25-21    Status Achieved  SLP SHORT TERM GOAL #4   Title Pt will verbally approximate 3 significant words for 5/10 trials given occasional fading to rare mod A over 2 sessions    Baseline 05-20-21, 05-25-21    Status Achieved              SLP Long Term Goals - 07/06/21 1423       SLP LONG TERM GOAL #1   Title Pt will spontaneously use multimodal communication system for 4/5 opportunities to increase ability to express wants/needs given >2 choices over 2 sessions    Baseline 06-17-21, 07-06-21    Time --    Period --    Status Achieved   re-cert     SLP LONG TERM GOAL #2   Title Pt will ID 80% of paraphasias/perseverations in a session and attempt to self-correct given occasional min A over 2 sessions    Baseline  06-17-21, 07-06-21    Time --    Period --    Status Achieved      SLP LONG TERM GOAL #3   Title Caregiver will appropriate cue patient when apraxic/aphasic errors occurs for 4/5 opportunities over 2 sessions    Time 4    Period Weeks    Status On-going   ongoing     SLP LONG TERM GOAL #4   Title Pt/daughter will report improved communication effectiveness via PROM by 2 points by last ST session    Time 4    Period Weeks    Status On-going   ongoing     SLP LONG TERM GOAL #5   Title Pt/caregiver will demonstate how to use and modify Lingraphica device with rare min A over 4 sessions    Time 4    Period Weeks    Status New              Plan - 07/06/21 1422     Clinical Impression Statement Wadie presents for OPST intervention to address 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 SLP provided ongoing education and training of trial Lingraphica device this session with patient. SLP demonstrated how to use device for functional communication. Pt exhibits improving comprehension and usage of communication device. SLP to communicate with Lingraphica re: request for permanent device. SLP recommends continuation of ST 2x/week for 4 weeks to aid modification and implementation of device at home. Skilled ST is recommended to address aphasia and apraxia impacting pt's abilty to communicate effectively with both familiar and unfamiliar listeners to convey wants/needs, safety concerns, and participate in social situations.    Speech Therapy Frequency 2x / week    Duration 4 weeks    Treatment/Interventions Compensatory strategies;Patient/family education;Functional tasks;Cueing hierarchy;Environmental controls;Cognitive reorganization;Multimodal communcation approach;Language facilitation;Compensatory techniques;Internal/external aids;SLP instruction and feedback;Other (comment)    Potential to Achieve Goals Fair    Potential Considerations Severity of impairments;Previous level of  function    SLP Home Exercise Plan provided    Consulted and Agree with Plan of Care Patient             Patient will benefit from skilled therapeutic intervention in order to improve the following deficits and impairments:   Aphasia  Verbal apraxia    Problem List Patient Active Problem List   Diagnosis Date Noted   Pain due to onychomycosis of toenails of both feet 11/19/2020   Anemia 05/31/2020   Acute ischemic left MCA stroke (HCC) 04/30/2020   Right hemiplegia (HCC) 04/30/2020   CVA (cerebral vascular accident) (HCC) 04/21/2020  Unspecified atrial fibrillation (HCC) 04/21/2020   Essential hypertension 04/21/2020   Alcohol abuse 04/21/2020    Janann Colonel, MA CCC-SLP 07/06/2021, 4:09 PM  [facility] [address] [place], [place], [address] Phone: [phone]   Fax:  [phone]   Name: Concettina Leth MRN: [id number] Date of Birth: May 22, 1956

## 2021-07-06 NOTE — Patient Instructions (Addendum)
For Thursday ST session, please bring back the device, stylus, and charger so I can mail it back.   If everything went well, I can request a permanent device for Lindsey [name]  Make sure you go through every icon every day to help familiarize yourself to the location of items.   Let me know how else we need to modify the device to best suit you!

## 2021-07-06 NOTE — Therapy (Signed)
[facility] [address] [place], [place], [address] Phone: [phone]   Fax:  [phone]  Occupational Therapy Treatment and Recertification  Patient Details  Name: Lindsey [name] MRN: [id number] Date of Birth: 03-16-1956 Referring Provider (OT): Leeroy Cha, MD   Encounter Date: 07/06/2021   OT End of Session - 07/06/21 1501     Visit Number 15    Number of Visits 24    Date for OT Re-Evaluation 08/05/21    Authorization Type Medicare/ Medicaid    Authorization Time Period Pt has used all MCD visits    Authorization - Visit Number 15    Authorization - Number of Visits 20    Progress Note Due on Visit 20    OT Start Time 1405    OT Stop Time 1445    OT Time Calculation (min) 40 min    Activity Tolerance Patient tolerated treatment well    Behavior During Therapy [facility] for tasks assessed/performed             Past Medical History:  Diagnosis Date   Diverticulosis    Hypertension    Uterine prolapse     History reviewed. No pertinent surgical history.  There were no vitals filed for this visit.   Subjective Assessment - 07/06/21 1452     Subjective  Patient indicates daughter injured her back    Pertinent History PMH significant for HTN, EtOH abuse, diverticulosis, CVA, July 2021, Botox June 2021    Limitations Fall Risk. Not Driving. Aphasia. Sensory deficits RUE. Apraxia.    Currently in Pain? No/denies    Pain Score 0-No pain                          OT Treatments/Exercises (OP) - 07/06/21 0001       ADLs   ADL Comments Reviewed OT Long term goals and plan to renew patient for one more month to address remaining goals.  Patient has missed a few OT session due to cargiver with back injury.      Splinting   Splinting Patient brought in broken resting hand splint.  Thumb portion of splint cracked nearly all the way through.  Initiated fabrication for resting hand splint.   Will complete next session.                    OT Education - 07/06/21 1500     Education Details Reviewed plan to renew OT x 1 additional month    Person(s) Educated Patient    Methods Explanation    Comprehension Need further instruction              OT Short Term Goals - 07/06/21 1424       OT SHORT TERM GOAL #1   Title Pt and caregiver will be independent with updated HEP    Baseline not issued yet    Time 4    Period Weeks    Status Achieved      OT SHORT TERM GOAL #2   Title Pt will donn pants with min A    Baseline not reviewed/issued    Time 4    Status Achieved      OT SHORT TERM GOAL #3   Title Pt will consistently demonstrate 30* active elbow flexion  in prep for  functional use.    Baseline trace elbow flexion    Time 4    Period Weeks  Status Not Met   Has ~15 degrees of active flexion inconsistently following facilitation     OT SHORT TERM GOAL #4   Title Pt consistently demonstrate 30 wrist extension in prep for functional use of RUE.    Baseline trace wrist extension    Time 4    Period Weeks    Status Achieved      OT SHORT TERM GOAL #5   Status Achieved      OT SHORT TERM GOAL #6   Title Pt will demonstrate 90* passive shoulder flexion without pain    Baseline 75*    Time 4    Period Weeks    Status Achieved      OT SHORT TERM GOAL #7   Title I with adapted equipment/ adapted strategies for ADLS(ie rocker knife)    Baseline dependent    Time 4    Period Weeks    Status Achieved   in clinic environment able to use rocker knife              OT Long Term Goals - 07/06/21 1425       OT LONG TERM GOAL #1   Title Pt and caregiver will be independent with updated HEP 08/05/21    Baseline not issued    Period Weeks    Status On-going    Target Date 08/05/21      OT LONG TERM GOAL #2   Title Pt will demonstrate ability to stand and pull up her pants with close supervision.    Baseline min A for standing, mod A for  LB dressing    Time 12    Period Weeks    Status On-going   Able to complete in simulated condition in clinic - needs to try at home with daughter     OT LONG TERM GOAL #3   Title Pt will use RUE as a stabilizer/ gross assist consistently at least 75% of the time during ADLS with min prompts    Baseline uses grossly 50% as stabilizer    Time 12    Period Weeks    Status Not Met      OT LONG TERM GOAL #4   Title Pt will perform LB bathing with min A    Baseline mod A    Time 12    Period Weeks    Status On-going      OT LONG TERM GOAL #5   Title Pt will perform simple snack or beverage prep with close supervision.    Baseline not currently performing    Time 12    Period Weeks    Status On-going      Long Term Additional Goals   Additional Long Term Goals Yes      OT LONG TERM GOAL #6   Title Patient will transfer to bedside commode or toilet with supervision.    Time 4    Period Weeks    Status New    Target Date 08/05/21                   Plan - 07/06/21 1504     Clinical Impression Statement Pt has not been to therapy for several weeks, plan to recertify x 4 more weeks to address remaining goals.    OT Occupational Profile and History Problem Focused Assessment - Including review of records relating to presenting problem    Occupational performance deficits (Please refer to evaluation for details): ADL's;IADL's;Leisure  Body Structure / Function / Physical Skills ADL;Decreased knowledge of use of DME;Strength;Gait;GMC;Tone;Pain;Dexterity;Balance;UE functional use;Proprioception;ROM;IADL;Vision;Sensation;Coordination;FMC;Flexibility;Decreased knowledge of precautions    Cognitive Skills Attention;Energy/Drive;Perception;Understand;Sequencing;Problem Solve;Memory;Safety Awareness;Thought    Rehab Potential Good    Clinical Decision Making Limited treatment options, no task modification necessary    Comorbidities Affecting Occupational Performance: None     Modification or Assistance to Complete Evaluation  No modification of tasks or assist necessary to complete eval    OT Frequency 2x / week    OT Duration 4 weeks   4 additional weeks   OT Treatment/Interventions Self-care/ADL training;Moist Heat;DME and/or AE instruction;Balance training;Aquatic Therapy;Therapeutic activities;Cognitive remediation/compensation;Therapeutic exercise;Visual/perceptual remediation/compensation;Passive range of motion;Neuromuscular education;Functional Mobility Training;Cryotherapy;Electrical Stimulation;Energy conservation;Manual Therapy;Patient/family education;Ultrasound;Paraffin;Fluidtherapy;Splinting    Plan finish resting hand splint, continue to address, toilet transfers with hemi-walker and remaining LT goals    OT Home Exercise Plan Self-PROM seated shoulder    Recommended Other Services Pt receiving ST and PT    Consulted and Agree with Plan of Care Patient             Patient will benefit from skilled therapeutic intervention in order to improve the following deficits and impairments:   Body Structure / Function / Physical Skills: ADL, Decreased knowledge of use of DME, Strength, Gait, GMC, Tone, Pain, Dexterity, Balance, UE functional use, Proprioception, ROM, IADL, Vision, Sensation, Coordination, FMC, Flexibility, Decreased knowledge of precautions Cognitive Skills: Attention, Energy/Drive, Perception, Understand, Sequencing, Problem Solve, Memory, Safety Awareness, Thought     Visit Diagnosis: Hemiplegia and hemiparesis following other cerebrovascular disease affecting right dominant side ([place]) - Plan: Ot plan of care cert/re-cert  Stiffness of right shoulder, not elsewhere classified - Plan: Ot plan of care cert/re-cert  Visuospatial deficit - Plan: Ot plan of care cert/re-cert  Other lack of coordination - Plan: Ot plan of care cert/re-cert  Muscle weakness (generalized) - Plan: Ot plan of care cert/re-cert  Attention and concentration  deficit - Plan: Ot plan of care cert/re-cert  Unsteadiness on feet - Plan: Ot plan of care cert/re-cert    Problem List Patient Active Problem List   Diagnosis Date Noted   Pain due to onychomycosis of toenails of both feet 11/19/2020   Anemia 05/31/2020   Acute ischemic left MCA stroke ([place]) 04/30/2020   Right hemiplegia ([place]) 04/30/2020   CVA (cerebral vascular accident) ([place]) 04/21/2020   Unspecified atrial fibrillation ([place]) 04/21/2020   Essential hypertension 04/21/2020   Alcohol abuse 04/21/2020    Mariah Milling, OT/L 07/06/2021, 3:20 PM  [place] [address] [place], [place], [address] Phone: [phone]   Fax:  [phone]  Name: Lindsey [name] MRN: [id number] Date of Birth: 1956/05/18

## 2021-07-07 NOTE — Therapy (Signed)
[facility] [address] [place], [place], [address] Phone: [phone]   Fax:  [phone]  Physical Therapy Treatment/Recertification  Patient Details  Name: Lindsey [name] MRN: [id number] Date of Birth: [birth year] Referring Provider (PT): Izora Ribas, MD   Encounter Date: 07/06/2021   PT End of Session - 07/06/21 1534     Visit Number 13    Number of Visits 20    Date for PT Re-Evaluation 07/30/21    Authorization Type MCR/MCD    Authorization Time Period 10/4-10/28/22    Authorization - Visit Number 17    Progress Note Due on Visit 10    PT Start Time 1532    PT Stop Time 1611    PT Time Calculation (min) 39 min    Equipment Utilized During Treatment Gait belt    Activity Tolerance Patient tolerated treatment well    Behavior During Therapy [facility] for tasks assessed/performed             Past Medical History:  Diagnosis Date   Diverticulosis    Hypertension    Uterine prolapse     History reviewed. No pertinent surgical history.  There were no vitals filed for this visit.   Subjective Assessment - 07/07/21 0801     Subjective Denies pain or any medical changes.  No falls to report.  Family has been using HHA for ambuation    Pertinent History HTN, Alcohol Abuse    Limitations Standing;Walking    How long can you sit comfortably? unlimited    How long can you stand comfortably? <5 min    How long can you walk comfortably? <47mn    Patient Stated Goals Be able to walk    Currently in Pain? No/denies    Pain Onset More than a month ago                [facility] 15[facility]       Assessment   Medical Diagnosis L MCA CVA    Referring Provider (PT) KIzora Ribas MD    Onset Date/Surgical Date 02/25/21                           [facility] 07/06/21 1537       Transfers   Transfers Sit to Stand;Stand to Sit    Sit to  Stand 5: Supervision    Stand to Sit 5: Supervision    Stand Pivot Transfers 4: Min guard    Stand Pivot Transfer Details (indicate cue type and reason) Pt was cued to have caregiver blocking right ankle at night as will not have brace on for safety to assist with transfer. Performed stand-pivot transfer chair to mat x 4 with CGA of PT keeping most weight on LLE.      Ambulation/Gait   Ambulation/Gait Yes    Ambulation/Gait Assistance 4: Min guard    Ambulation/Gait Assistance Details PT provided some tactile cues for weight shifting during gait. Verbal cues to increase right foot clearance and step past right foot with left foot. Pt had 1-2 episodes of catching the front of right foot during swing.    Ambulation Distance (Feet) 115 Feet   x 2   Assistive device Small based quad cane    Gait Pattern Step-to pattern;Step-through pattern    Ambulation Surface Level;Indoor    Gait velocity 43.11 sec= 0.262m or 0.7660fec  Standardized Balance Assessment   Standardized Balance Assessment Timed Up and Go Test      Timed Up and Go Test   TUG Normal TUG    Normal TUG (seconds) 46.93   with SBQC     Neuro Re-ed    Neuro Re-ed Details  In // bars: tapping 2x4" beam with LLE x 10 with 1 UE support, then maintaining LLE on foam beam to further increase right weight shift with tactile cues at pelvis to prevent posterior pelvic rotation, then added in raising LUE over head x 10 then with PT assisting RUE on to bar for some weight bearing and repeated LUE over head flexion with PT providing more tactile assist at pelvis and stabilizing right arm CGA/min assist.                     PT Education - 07/07/21 0805     Education Details Discussed recert plan for 2x/week for 4 weeks.    Person(s) Educated Patient    Methods Explanation    Comprehension Verbalized understanding              PT Short Term Goals - 06/10/21 1630       PT SHORT TERM GOAL #1   Title Patient to demo I  in initial HEP    Baseline TBD; 04/19/21 Reviewed and added to today.    Time 4    Period Weeks    Status Partially Met    Target Date 05/07/21      PT SHORT TERM GOAL #2   Title Patient will be able to ambulate with HW and SBA x 230 ft to demonstrate improved household mobility    Baseline 12f with RW and CGA/MinA; 05/11/21 Ambulation of 1138f2 with hemiwalker and CGA; 06/10/21 Able to ambulate 23069fith HW and CGA    Time 4    Period Weeks    Status Partially Met    Target Date 05/07/21      PT SHORT TERM GOAL #3   Title Patient to transfer to all positions with CGA    Baseline MnA/CGA for STS [facility]stand with CGA to SBA;06/10/21 Patient able to stand pivot transfer from mat to WC [facility] with SBA    Time 4    Period Weeks    Status Achieved    Target Date 05/07/21      PT SHORT TERM GOAL #4   Title Patient to negotiate 4 steps with L rail with MinA and step to pattern    Baseline TBD; 05/20/21 Patient able to negotiate 4 steps with step to pattern and L rail under SBA; 06/10/21 SBA with step to pattern with L rail    Time 4    Period Days    Status Achieved    Target Date 05/07/21      PT SHORT TERM GOAL #5   Title Perform DGI and set goal; 06/08/21 Goal is 12    Baseline TBD; 06/08/21 DGI score 6, assessed with SPC and CGA    Time 4    Period Weeks    Status Achieved    Target Date 05/07/21      PT SHORT TERM GOAL #6   Title assess gait velocity and set goal; 06/10/21 Goal is 0.20 m/s    Baseline TBD; 06/10/21 Gait speed 0.14 m/s    Time 4    Period Days    Status New  Target Date 05/07/21               PT Long Term Goals - 07/07/21 0807       PT LONG TERM GOAL #1   Title Patient will be independent with final HEP and walking program within the home with caregiver assistance if needed (LTGs due 07/02/21)    Baseline PT continues to make adjustments to HEP 07/06/21    Time 4   th visit   Period Weeks    Status On-going       PT LONG TERM GOAL #2   Title Patient to perform all transfers with SBA( except floor transfers)    Baseline CGA/MinA for STS transfers. 07/06/21 SBA for sit to stand, CGA for stand-pivot    Time 4    Period Weeks    Status Partially Met      PT LONG TERM GOAL #3   Title Patient to ambulate 559f with RW, R AFO with SBA    Baseline 1069fin clinic with RW and R AFO using CGA.MinA. 07/06/21 115' with SBQC, R AFO and CGA    Time 4    Period Weeks    Status Not Met      PT LONG TERM GOAL #4   Title Pt will increase gait speed from 0.2347mto >0.50m61mor improved household mobility.    Baseline 06/15/21 0.40m/79m0/4/22 0.40m/s4mTime 4    Period Weeks    Status Not Met      PT LONG TERM GOAL #5   Title Assess progress towards DGI    Baseline TBD    Time 4    Period Weeks    Status Deferred           Updated goals:  PT Short Term Goals - 07/07/21 0816       PT SHORT TERM GOAL #1   Title STGs=LTGs             PT Long Term Goals - 07/07/21 0817       PT LONG TERM GOAL #1   Title Patient will be independent with final HEP and walking program within the home with caregiver assistance if needed (LTGs due 07/30/21)    Baseline PT continues to make adjustments to HEP 07/06/21    Time 4    Period Weeks    Status On-going    Target Date 07/30/21      PT LONG TERM GOAL #2   Title Patient to perform all transfers with SBA( except floor transfers)    Baseline CGA/MinA for STS transfers. 07/06/21 SBA for sit to stand, CGA for stand-pivot    Time 4    Period Weeks    Status On-going    Target Date 07/30/21      PT LONG TERM GOAL #3   Title Pt will ambulate 300' with SBQC SBA for improved household mobility.    Baseline 100ft i60finic with RW and R AFO using CGA.MinA. 07/06/21 115' with SBQC, R AFO and CGA    Time 4    Period Weeks    Status Revised    Target Date 07/30/21      PT LONG TERM GOAL #4   Title Pt will increase gait speed from 0.40m/s t60m.50m/s fo74mmproved household mobility.    Baseline 06/15/21 0.40m/s. 1010m2 0.40m/s    T1m4    Period Weeks    Status On-going    Target Date 07/30/21  PT LONG TERM GOAL #5   Title Pt will decrease TUG form 46.93 sec to <40 sec for improved balance and functional mobility.    Baseline 07/06/21 46.93 sec    Time 4    Period Weeks    Status New    Target Date 07/30/21                    Plan - 07/07/21 0809     Clinical Impression Statement Pt is overall requiring less assistance with gait with use of SBQC but remains fearful with CGA. She needs cuing to increase right foot clearance and to assist with weight shift. Pt able to get more step-through pattern with cuing but does not maintain for long periods. Pt had no change on gait speed with 0.25ms indicating decreased safety with household ambulation. TUG was performed today and pt is high fall risk with score of 46.93 sec. Pt will benefit from continued PT to continue to work on improving mobility for more independence and safety in home.    Personal Factors and Comorbidities Comorbidity 2    Comorbidities HTN, Alcohol Abuse, Diverticulosis    Examination-Activity Limitations Bed Mobility;Bathing;Sit;Transfers;Dressing;Stairs;Stand;Locomotion Level;Toileting    Examination-Participation Restrictions Occupation;Community Activity;Driving    Stability/Clinical Decision Making Evolving/Moderate complexity    Rehab Potential Good    PT Frequency 2x / week    PT Duration 4 weeks    PT Treatment/Interventions ADLs/Self Care Home Management;Electrical Stimulation;DME Instruction;Gait training;Stair training;Therapeutic activities;Functional mobility training;Therapeutic exercise;Balance training;Neuromuscular re-education;Patient/family education;Orthotic Fit/Training;Wheelchair mobility training;Manual techniques;Passive range of motion    PT Next Visit Plan Instruct pt/family to [facility]. Continue gait training working on  increasing right foot clearance and left step length for more step-through pattern. NMR to work on weight shifting activities to the right. Continue to practice transfers.    PT Home Exercise Plan Access Code: [email or web address]   Consulted and Agree with Plan of Care Patient;Family member/caregiver             Patient will benefit from skilled therapeutic intervention in order to improve the following deficits and impairments:  Abnormal gait, Decreased balance, Decreased mobility, Decreased endurance, Difficulty walking, Impaired sensation, Impaired UE functional use, Decreased strength, Decreased safety awareness, Decreased knowledge of use of DME, Decreased activity tolerance, Decreased range of motion, Impaired tone, Improper body mechanics  Visit Diagnosis: Other abnormalities of gait and mobility  Muscle weakness (generalized)  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)  Unsteadiness on feet     Problem List Patient Active Problem List   Diagnosis Date Noted   Pain due to onychomycosis of toenails of both feet 11/19/2020   Anemia 05/31/2020   Acute ischemic left MCA stroke ([place] 04/30/2020   [place]iplegia ([place] 04/30/2020   CV[place] (cerebral vascular accident) ([place] 04/21/2020   Unspecified atrial fibrillation ([place] 04/21/2020   Essential hypertension 04/21/2020   Alcohol abuse 04/21/2020    EElecta Sniff PT, DPT, NCS 07/07/2021, 8:16 AM  [place]SEastportGChalky[place] [address]Phone: [phone]  Fax:  [phone] Name: CBryttney NetzerMRN: [id number] of Birth: 602-Sep-1957

## 2021-07-08 ENCOUNTER — Ambulatory Visit: Payer: Medicare Other | Admitting: Occupational Therapy

## 2021-07-08 ENCOUNTER — Ambulatory Visit: Payer: Medicare Other

## 2021-07-08 ENCOUNTER — Other Ambulatory Visit: Payer: Self-pay

## 2021-07-08 ENCOUNTER — Encounter: Payer: Self-pay | Admitting: Occupational Therapy

## 2021-07-08 DIAGNOSIS — R4701 Aphasia: Secondary | ICD-10-CM

## 2021-07-08 DIAGNOSIS — R2689 Other abnormalities of gait and mobility: Secondary | ICD-10-CM | POA: Diagnosis not present

## 2021-07-08 DIAGNOSIS — I69851 Hemiplegia and hemiparesis following other cerebrovascular disease affecting right dominant side: Secondary | ICD-10-CM

## 2021-07-08 DIAGNOSIS — R482 Apraxia: Secondary | ICD-10-CM

## 2021-07-08 DIAGNOSIS — M6281 Muscle weakness (generalized): Secondary | ICD-10-CM

## 2021-07-08 DIAGNOSIS — M25611 Stiffness of right shoulder, not elsewhere classified: Secondary | ICD-10-CM

## 2021-07-08 NOTE — Therapy (Signed)
[facility] [address] [place], [place], [address] Phone: [phone]   Fax:  [phone]  Speech Language Pathology Treatment  Patient Details  Name: Lindsey [name] MRN: [id number] Date of Birth: April 09, 1956 Referring Provider (SLP): Carlis Abbott Drema Pry, MD   Encounter Date: 07/08/2021   End of Session - 07/08/21 1643     Visit Number 15    Number of Visits 25    Date for SLP Re-Evaluation 08/06/21    Authorization Type Medicare    SLP Start Time 1620    SLP Stop Time  1700    SLP Time Calculation (min) 40 min    Activity Tolerance Patient tolerated treatment well             Past Medical History:  Diagnosis Date   Diverticulosis    Hypertension    Uterine prolapse     History reviewed. No pertinent surgical history.  There were no vitals filed for this visit.   Subjective Assessment - 07/08/21 1633     Subjective pt returned with device    Currently in Pain? No/denies                   ADULT SLP TREATMENT - 07/08/21 1633       General Information   Behavior/Cognition Alert;Cooperative;Pleasant mood      Treatment Provided   Treatment provided Cognitive-Linquistic      Cognitive-Linquistic Treatment   Treatment focused on Aphasia;Apraxia;Patient/family/caregiver education    Skilled Treatment Pt brought back trial Lingraphica device to return. SLP targeted usage of device for final day, in which pt exhibited good accuracy for navigation of device and comprehension and identification of targeted icons. Improved spontaneous and repeated speech exhibited this session with less frequent cues as pt is increasingly more aware of errors.      Assessment / Recommendations / Plan   Plan Continue with current plan of care      Progression Toward Goals   Progression toward goals Progressing toward goals              SLP Education - 07/08/21 1644     Education Details recommendation  for permanent Lingraphica device    Person(s) Educated Patient    Methods Explanation;Demonstration;Verbal cues    Comprehension Verbalized understanding;Returned demonstration;Need further instruction              SLP Short Term Goals - 05/25/21 1522       SLP SHORT TERM GOAL #1   Title Pt will ID paraphasias/perseverations and attempt to correct errors with pause and retrial for 4/5 opportunities given occasional mod A over 2 sessions    Baseline 05-04-21, 05-20-21    Status Achieved      SLP SHORT TERM GOAL #2   Title Given verbal or visual prompt, pt will correctly ID written word/object/icon in field of 4-6 to communicate wants/needs with 75% accuracy given occasional mod A over 2 sessions    Baseline 05-04-21, 05-18-21    Status Achieved      SLP SHORT TERM GOAL #3   Title Pt will use multimodal/AAC to ID biographical information and personal interests given 2-6 choices with occasional mod A over 2 sessions    Baseline 05-18-21, 05-25-21    Status Achieved      SLP SHORT TERM GOAL #4   Title Pt will verbally approximate 3 significant words for 5/10 trials given occasional fading to rare mod A over 2 sessions  Baseline 05-20-21, 05-25-21    Status Achieved              SLP Long Term Goals - 07/08/21 1642       SLP LONG TERM GOAL #1   Title Pt will spontaneously use multimodal communication system for 4/5 opportunities to increase ability to express wants/needs given >2 choices over 2 sessions    Baseline 06-17-21, 07-06-21    Status Achieved   re-cert     SLP LONG TERM GOAL #2   Title Pt will ID 80% of paraphasias/perseverations in a session and attempt to self-correct given occasional min A over 2 sessions    Baseline 06-17-21, 07-06-21    Status Achieved      SLP LONG TERM GOAL #3   Title Caregiver will appropriate cue patient when apraxic/aphasic errors occurs for 4/5 opportunities over 2 sessions    Time 4    Period Weeks    Status On-going   ongoing     SLP  LONG TERM GOAL #4   Title Pt/daughter will report improved communication effectiveness via PROM by 2 points by last ST session    Time 4    Period Weeks    Status On-going   ongoing     SLP LONG TERM GOAL #5   Title Pt/caregiver will demonstate how to use and modify Lingraphica device with rare min A over 4 sessions    Time 4    Period Weeks    Status On-going              Plan - 07/08/21 1645     Clinical Impression Statement Lindsey [name] presents for OPST intervention to address 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 SLP provided ongoing education and training of trial Lingraphica device this session with patient. SLP demonstrated how to use device for functional communication. Pt exhibits improving comprehension and usage of communication device. SLP requested permanent device. Skilled ST is recommended to address aphasia and apraxia impacting pt's abilty to communicate effectively with both familiar and unfamiliar listeners to convey wants/needs, safety concerns, and participate in social situations.    Speech Therapy Frequency 2x / week    Duration 4 weeks    Treatment/Interventions Compensatory strategies;Patient/family education;Functional tasks;Cueing hierarchy;Environmental controls;Cognitive reorganization;Multimodal communcation approach;Language facilitation;Compensatory techniques;Internal/external aids;SLP instruction and feedback;Other (comment)    Potential to Achieve Goals Fair    Potential Considerations Severity of impairments;Previous level of function    SLP Home Exercise Plan provided    Consulted and Agree with Plan of Care Patient             Patient will benefit from skilled therapeutic intervention in order to improve the following deficits and impairments:   Aphasia  Verbal apraxia    Problem List Patient Active Problem List   Diagnosis Date Noted   Pain due to onychomycosis of toenails of both feet 11/19/2020   Anemia 05/31/2020    Acute ischemic left MCA stroke (HCC) 04/30/2020   Right hemiplegia (HCC) 04/30/2020   CVA (cerebral vascular accident) (HCC) 04/21/2020   Unspecified atrial fibrillation (HCC) 04/21/2020   Essential hypertension 04/21/2020   Alcohol abuse 04/21/2020    Janann Colonel, MA CCC-SLP 07/08/2021, 7:57 PM  [facility] [address] [place], [place], [address] Phone: [phone]   Fax:  [phone]   Name: Lindsey [name] MRN: [id number] Date of Birth: 06-Apr-1956

## 2021-07-09 NOTE — Therapy (Signed)
[facility] [address] [place], [place], [address] Phone: [phone]   Fax:  [phone]  Occupational Therapy Treatment  Patient Details  Name: Lindsey [name] MRN: [id number] Date of Birth: 04/12/1956 Referring Provider (OT): Leeroy Cha, MD   Encounter Date: 07/08/2021   OT End of Session - 07/08/21 1540     Visit Number 16    Number of Visits 24    Authorization Type Medicare/ Medicaid    Authorization Time Period Pt has used all MCD visits    Authorization - Visit Number 16    Authorization - Number of Visits 20    Progress Note Due on Visit 20    OT Start Time 1534    OT Stop Time 1614    OT Time Calculation (min) 40 min             Past Medical History:  Diagnosis Date   Diverticulosis    Hypertension    Uterine prolapse     History reviewed. No pertinent surgical history.  There were no vitals filed for this visit.   Subjective Assessment - 07/08/21 1539     Subjective  Deneis pain    Pertinent History PMH significant for HTN, EtOH abuse, diverticulosis, CVA, July 2021, Botox June 2021    Limitations Fall Risk. Not Driving. Aphasia. Sensory deficits RUE. Apraxia.    Patient Stated Goals trying to get better    Currently in Pain? No/denies                  Treatment: Therapist fitted pt with resting  hand splint, initial splint did not provide adequate support at wrist and thumb. Pt was re-fitted with another splint for improved positioning. Splint was not issued due to time constraints. Will plan to complete next visit.                  OT Short Term Goals - 07/06/21 1424       OT SHORT TERM GOAL #1   Title Pt and caregiver will be independent with updated HEP    Baseline not issued yet    Time 4    Period Weeks    Status Achieved      OT SHORT TERM GOAL #2   Title Pt will donn pants with min A    Baseline not reviewed/issued    Time 4    Status  Achieved      OT SHORT TERM GOAL #3   Title Pt will consistently demonstrate 30* active elbow flexion  in prep for  functional use.    Baseline trace elbow flexion    Time 4    Period Weeks    Status Not Met   Has ~15 degrees of active flexion inconsistently following facilitation     OT SHORT TERM GOAL #4   Title Pt consistently demonstrate 30 wrist extension in prep for functional use of RUE.    Baseline trace wrist extension    Time 4    Period Weeks    Status Achieved      OT SHORT TERM GOAL #5   Status Achieved      OT SHORT TERM GOAL #6   Title Pt will demonstrate 90* passive shoulder flexion without pain    Baseline 75*    Time 4    Period Weeks    Status Achieved      OT SHORT TERM GOAL #7   Title I with adapted equipment/  adapted strategies for ADLS(ie rocker knife)    Baseline dependent    Time 4    Period Weeks    Status Achieved   in clinic environment able to use rocker knife              OT Long Term Goals - 07/06/21 1425       OT LONG TERM GOAL #1   Title Pt and caregiver will be independent with updated HEP 08/05/21    Baseline not issued    Period Weeks    Status On-going    Target Date 08/05/21      OT LONG TERM GOAL #2   Title Pt will demonstrate ability to stand and pull up her pants with close supervision.    Baseline min A for standing, mod A for LB dressing    Time 12    Period Weeks    Status On-going   Able to complete in simulated condition in clinic - needs to try at home with daughter     OT LONG TERM GOAL #3   Title Pt will use RUE as a stabilizer/ gross assist consistently at least 75% of the time during ADLS with min prompts    Baseline uses grossly 50% as stabilizer    Time 12    Period Weeks    Status Not Met      OT LONG TERM GOAL #4   Title Pt will perform LB bathing with min A    Baseline mod A    Time 12    Period Weeks    Status On-going      OT LONG TERM GOAL #5   Title Pt will perform simple snack or  beverage prep with close supervision.    Baseline not currently performing    Time 12    Period Weeks    Status On-going      Long Term Additional Goals   Additional Long Term Goals Yes      OT LONG TERM GOAL #6   Title Patient will transfer to bedside commode or toilet with supervision.    Time 4    Period Weeks    Status New    Target Date 08/05/21                   Plan - 07/09/21 1424     Clinical Impression Statement Therapist initated fabrication and fitting of resting hand splint replacement as previous on cracked.    OT Occupational Profile and History Problem Focused Assessment - Including review of records relating to presenting problem    Occupational performance deficits (Please refer to evaluation for details): ADL's;IADL's;Leisure    Body Structure / Function / Physical Skills ADL;Decreased knowledge of use of DME;Strength;Gait;GMC;Tone;Pain;Dexterity;Balance;UE functional use;Proprioception;ROM;IADL;Vision;Sensation;Coordination;FMC;Flexibility;Decreased knowledge of precautions    Cognitive Skills Attention;Energy/Drive;Perception;Understand;Sequencing;Problem Solve;Memory;Safety Awareness;Thought    Rehab Potential Good    Clinical Decision Making Limited treatment options, no task modification necessary    Comorbidities Affecting Occupational Performance: None    Modification or Assistance to Complete Evaluation  No modification of tasks or assist necessary to complete eval    OT Frequency 2x / week    OT Duration 4 weeks   4 additional weeks   OT Treatment/Interventions Self-care/ADL training;Moist Heat;DME and/or AE instruction;Balance training;Aquatic Therapy;Therapeutic activities;Cognitive remediation/compensation;Therapeutic exercise;Visual/perceptual remediation/compensation;Passive range of motion;Neuromuscular education;Functional Mobility Training;Cryotherapy;Electrical Stimulation;Energy conservation;Manual Therapy;Patient/family  education;Ultrasound;Paraffin;Fluidtherapy;Splinting    Plan finish resting hand splint next visit, splint was not issued due to addtional modification needed,  continue to address, toilet transfers with hemi-walker and remaining LT goals    OT Home Exercise Plan Self-PROM seated shoulder    Recommended Other Services Pt receiving ST and PT    Consulted and Agree with Plan of Care Patient             Patient will benefit from skilled therapeutic intervention in order to improve the following deficits and impairments:   Body Structure / Function / Physical Skills: ADL, Decreased knowledge of use of DME, Strength, Gait, GMC, Tone, Pain, Dexterity, Balance, UE functional use, Proprioception, ROM, IADL, Vision, Sensation, Coordination, FMC, Flexibility, Decreased knowledge of precautions Cognitive Skills: Attention, Energy/Drive, Perception, Understand, Sequencing, Problem Solve, Memory, Safety Awareness, Thought     Visit Diagnosis: Stiffness of right shoulder, not elsewhere classified  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)  Muscle weakness (generalized)    Problem List Patient Active Problem List   Diagnosis Date Noted   Pain due to onychomycosis of toenails of both feet 11/19/2020   Anemia 05/31/2020   Acute ischemic left MCA stroke ([place]) 04/30/2020   Right hemiplegia ([place]) 04/30/2020   CVA (cerebral vascular accident) ([place]) 04/21/2020   Unspecified atrial fibrillation ([place]) 04/21/2020   Essential hypertension 04/21/2020   Alcohol abuse 04/21/2020    RINE,[name], OT/L 07/09/2021, 2:26 PM  [facility] [address] [place], [place], [address] Phone: [phone]   Fax:  [phone]  Name: Tanja Gift MRN: [id number] Date of Birth: May 31, 1956

## 2021-07-12 ENCOUNTER — Telehealth: Payer: Self-pay | Admitting: *Deleted

## 2021-07-12 ENCOUNTER — Other Ambulatory Visit: Payer: Self-pay | Admitting: Physical Medicine and Rehabilitation

## 2021-07-12 MED ORDER — APIXABAN 5 MG PO TABS
5.0000 mg | ORAL_TABLET | Freq: Two times a day (BID) | ORAL | 0 refills | Status: DC
Start: 2021-07-12 — End: 2021-07-20

## 2021-07-12 NOTE — Telephone Encounter (Signed)
Mrs Lindsey [name] called about getting her refills on atorvastatin for 90 days but only 30 days of Eliquis. She is wanting to know why not 90 day?

## 2021-07-13 ENCOUNTER — Ambulatory Visit: Payer: Medicare Other

## 2021-07-13 ENCOUNTER — Other Ambulatory Visit: Payer: Self-pay

## 2021-07-13 DIAGNOSIS — R2689 Other abnormalities of gait and mobility: Secondary | ICD-10-CM

## 2021-07-13 DIAGNOSIS — M6281 Muscle weakness (generalized): Secondary | ICD-10-CM

## 2021-07-13 DIAGNOSIS — I69851 Hemiplegia and hemiparesis following other cerebrovascular disease affecting right dominant side: Secondary | ICD-10-CM

## 2021-07-13 DIAGNOSIS — R2681 Unsteadiness on feet: Secondary | ICD-10-CM

## 2021-07-13 DIAGNOSIS — R482 Apraxia: Secondary | ICD-10-CM

## 2021-07-13 DIAGNOSIS — R4701 Aphasia: Secondary | ICD-10-CM

## 2021-07-13 NOTE — Telephone Encounter (Signed)
Notified it was changed to 90 day supply.

## 2021-07-13 NOTE — Therapy (Signed)
[facility] [address] [place], [place], [address] Phone: [phone]   Fax:  [phone]  Physical Therapy Treatment  Patient Details  Name: Lindsey [name] MRN: [id number] Date of Birth: 09-19-1956 Referring Provider (PT): Horton Chin, MD   Encounter Date: 07/13/2021   PT End of Session - 07/13/21 1601     Visit Number 14    Number of Visits 20    Date for PT Re-Evaluation 07/30/21    Authorization Type MCR/MCD    Authorization Time Period 10/4-10/28/22    Progress Note Due on Visit 20    PT Start Time 1535    PT Stop Time 1615    PT Time Calculation (min) 40 min    Equipment Utilized During Treatment Gait belt    Activity Tolerance Patient tolerated treatment well    Behavior During Therapy [facility] for tasks assessed/performed             Past Medical History:  Diagnosis Date   Diverticulosis    Hypertension    Uterine prolapse     History reviewed. No pertinent surgical history.  There were no vitals filed for this visit.   Subjective Assessment - 07/13/21 1535     Subjective No changes to note, no falls or LOB    Patient is accompained by: Family member    Pertinent History HTN, Alcohol Abuse    Limitations Standing;Walking    How long can you sit comfortably? unlimited    How long can you stand comfortably? <5 min    How long can you walk comfortably? <42min    Patient Stated Goals Be able to walk    Pain Onset More than a month ago                               [facility] Adult PT Treatment/Exercise - 07/13/21 0001       Transfers   Transfers Sit to Stand;Stand to Sit;Stand Pivot Transfers    Sit to Stand 4: Min guard    Stand to Sit 4: Min guard    Stand Pivot Transfers 4: Min guard    Stand Pivot Transfer Details (indicate cue type and reason) transfer ot fom mat to chair, once to L and once to R side      Ambulation/Gait   Ambulation/Gait Yes     Ambulation/Gait Assistance 4: Min guard    Ambulation/Gait Assistance Details 2 episodes of R foot scuff requring patient to stop and reset but no LOB noted    Ambulation Distance (Feet) 115 Feet    Gait Pattern Step-to pattern;Poor foot clearance - right;Trunk flexed    Ambulation Surface Level;Indoor                 Balance Exercises - 07/13/21 0001       Balance Exercises: Standing   Standing, One Foot on a Step Eyes open;Limitations    Standing, One Foot on a Step Limitations L foot on 4" step reaching OH with LUE an WS forward to emphasize trunk extension    Other Standing Exercises In front of wall, sliding LUE OH and WS hips forward 10x to facilitate trunk extension and WS                  PT Short Term Goals - 07/07/21 0816       PT SHORT TERM GOAL #1   Title STGs=LTGs  PT Long Term Goals - 07/07/21 0817       PT LONG TERM GOAL #1   Title Patient will be independent with final HEP and walking program within the home with caregiver assistance if needed (LTGs due 07/30/21)    Baseline PT continues to make adjustments to HEP 07/06/21    Time 4    Period Weeks    Status On-going    Target Date 07/30/21      PT LONG TERM GOAL #2   Title Patient to perform all transfers with SBA( except floor transfers)    Baseline CGA/MinA for STS transfers. 07/06/21 SBA for sit to stand, CGA for stand-pivot    Time 4    Period Weeks    Status On-going    Target Date 07/30/21      PT LONG TERM GOAL #3   Title Pt will ambulate 300' with SBQC SBA for improved household mobility.    Baseline 138ft in clinic with RW and R AFO using CGA.MinA. 07/06/21 115' with SBQC, R AFO and CGA    Time 4    Period Weeks    Status Revised    Target Date 07/30/21      PT LONG TERM GOAL #4   Title Pt will increase gait speed from 0.76m/s to >0.23m/s for improved household mobility.    Baseline 06/15/21 0.74m/s. 07/06/21 0.11m/s    Time 4    Period Weeks    Status  On-going    Target Date 07/30/21      PT LONG TERM GOAL #5   Title Pt will decrease TUG form 46.93 sec to <40 sec for improved balance and functional mobility.    Baseline 07/06/21 46.93 sec    Time 4    Period Weeks    Status New    Target Date 07/30/21                   Plan - 07/13/21 1604     Clinical Impression Statement Todays session continued focus on transfer skills and ambulation in anticipationof pending DC.  Practiced STS and stand pivot transfers to/from chair to simulate commode transfer with and without cane, to each side, effected and non-effected.  Continued gait training with [facility] which has proven most appropriate AD.  VCs needed to shift gaze from ground as well as correct flexed posture.  Introduced standing at wall and reaching OH with RUE to facilitate trunk extension    Personal Factors and Comorbidities Comorbidity 2    Comorbidities HTN, Alcohol Abuse, Diverticulosis    Examination-Activity Limitations Bed Mobility;Bathing;Sit;Transfers;Dressing;Stairs;Stand;Locomotion Level;Toileting    Examination-Participation Restrictions Occupation;Community Activity;Driving    Stability/Clinical Decision Making Evolving/Moderate complexity    Rehab Potential Good    PT Frequency 2x / week    PT Duration 4 weeks    PT Treatment/Interventions ADLs/Self Care Home Management;Electrical Stimulation;DME Instruction;Gait training;Stair training;Therapeutic activities;Functional mobility training;Therapeutic exercise;Balance training;Neuromuscular re-education;Patient/family education;Orthotic Fit/Training;Wheelchair mobility training;Manual techniques;Passive range of motion    PT Next Visit Plan Instruct pt/family to purchase [facility] for home. Continue gait training working on increasing right foot clearance and left step length for more step-through pattern. NMR to work on weight shifting activities to the right. Continue to practice transfers.  encourage trunk extension and  posture    PT Home Exercise Plan Access Code: [email or web address]    Consulted and Agree with Plan of Care Patient;Family member/caregiver             Patient will benefit from skilled  therapeutic intervention in order to improve the following deficits and impairments:  Abnormal gait, Decreased balance, Decreased mobility, Decreased endurance, Difficulty walking, Impaired sensation, Impaired UE functional use, Decreased strength, Decreased safety awareness, Decreased knowledge of use of DME, Decreased activity tolerance, Decreased range of motion, Impaired tone, Improper body mechanics  Visit Diagnosis: Unsteadiness on feet  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)  Muscle weakness (generalized)  Other abnormalities of gait and mobility     Problem List Patient Active Problem List   Diagnosis Date Noted   Pain due to onychomycosis of toenails of both feet 11/19/2020   Anemia 05/31/2020   Acute ischemic left MCA stroke (HCC) 04/30/2020   Right hemiplegia (HCC) 04/30/2020   CVA (cerebral vascular accident) (HCC) 04/21/2020   Unspecified atrial fibrillation (HCC) 04/21/2020   Essential hypertension 04/21/2020   Alcohol abuse 04/21/2020    Hildred Laser, PT 07/13/2021, 4:17 PM  [facility] [address] [place], [place], [address] Phone: [phone]   Fax:  [phone]  Name: Lindsey [name] MRN: [id number] Date of Birth: Aug 12, 1956

## 2021-07-13 NOTE — Therapy (Signed)
[facility] [address] [place], [place], [address] Phone: [phone]   Fax:  [phone]  Speech Language Pathology Treatment  Patient Details  Name: Lindsey [name] MRN: [id number] Date of Birth: 03/08/1956 Referring Provider (SLP): Carlis Abbott Drema Pry, MD   Encounter Date: 07/13/2021   End of Session - 07/13/21 1433     Visit Number 16    Number of Visits 25    Date for SLP Re-Evaluation 08/06/21    Authorization Type Medicare    SLP Start Time 1445    SLP Stop Time  1530    SLP Time Calculation (min) 45 min    Activity Tolerance Patient tolerated treatment well             Past Medical History:  Diagnosis Date   Diverticulosis    Hypertension    Uterine prolapse     History reviewed. No pertinent surgical history.  There were no vitals filed for this visit.   Subjective Assessment - 07/13/21 1729     Subjective "okay"    Currently in Pain? No/denies                   ADULT SLP TREATMENT - 07/13/21 1432       General Information   Behavior/Cognition Alert;Cooperative;Pleasant mood      Treatment Provided   Treatment provided Cognitive-Linquistic      Cognitive-Linquistic Treatment   Treatment focused on Aphasia;Apraxia;Patient/family/caregiver education    Skilled Treatment SLP targeted writing and reading this session. Pt able to write name without cues. Pt required min to mod A to write birthdate and age respectively due to perseverations. Pt unable to write answers to simple questions spontaneously. Good ability to copy exhibited. Pt was not able to read and demonstrate 1-step directions. Pt benefited from auditory instruction to increase accuracy, which was sometimes inconsistent. SLP trialed matching written words to pictures, in which pt completed with 67% accuracy. Pt able to intermittently self-correct responses given additional time as some perseveration exhibited.       Assessment / Recommendations / Plan   Plan Continue with current plan of care      Progression Toward Goals   Progression toward goals Progressing toward goals              SLP Education - 07/13/21 1732     Education Details writing/reading    Person(s) Educated Patient    Methods Explanation;Demonstration;Verbal cues    Comprehension Verbalized understanding;Returned demonstration;Verbal cues required;Need further instruction              SLP Short Term Goals - 05/25/21 1522       SLP SHORT TERM GOAL #1   Title Pt will ID paraphasias/perseverations and attempt to correct errors with pause and retrial for 4/5 opportunities given occasional mod A over 2 sessions    Baseline 05-04-21, 05-20-21    Status Achieved      SLP SHORT TERM GOAL #2   Title Given verbal or visual prompt, pt will correctly ID written word/object/icon in field of 4-6 to communicate wants/needs with 75% accuracy given occasional mod A over 2 sessions    Baseline 05-04-21, 05-18-21    Status Achieved      SLP SHORT TERM GOAL #3   Title Pt will use multimodal/AAC to ID biographical information and personal interests given 2-6 choices with occasional mod A over 2 sessions    Baseline 05-18-21, 05-25-21    Status Achieved  SLP SHORT TERM GOAL #4   Title Pt will verbally approximate 3 significant words for 5/10 trials given occasional fading to rare mod A over 2 sessions    Baseline 05-20-21, 05-25-21    Status Achieved              SLP Long Term Goals - 07/13/21 1433       SLP LONG TERM GOAL #1   Title Pt will spontaneously use multimodal communication system for 4/5 opportunities to increase ability to express wants/needs given >2 choices over 2 sessions    Baseline 06-17-21, 07-06-21    Status Achieved      SLP LONG TERM GOAL #2   Title Pt will ID 80% of paraphasias/perseverations in a session and attempt to self-correct given occasional min A over 2 sessions    Baseline 06-17-21, 07-06-21     Status Achieved      SLP LONG TERM GOAL #3   Title Caregiver will appropriate cue patient when apraxic/aphasic errors occurs for 4/5 opportunities over 2 sessions    Time 3    Period Weeks    Status On-going   ongoing     SLP LONG TERM GOAL #4   Title Pt/daughter will report improved communication effectiveness via PROM by 2 points by last ST session    Time 3    Period Weeks    Status On-going   ongoing     SLP LONG TERM GOAL #5   Title Pt/caregiver will demonstate how to use and modify Lingraphica device with rare min A over 4 sessions    Time 3    Period Weeks    Status On-going              Plan - 07/13/21 1433     Clinical Impression Statement Lindsey [name] presents for OPST intervention to address 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 SLP targeting other areas of multimodal communication, including reading and writing. Pt able to write some limited basic biographical information. Pt unable to read and demonstrate 1-step directions. Pt matched pictures and words with 66% accuracy with occasional self-corrections. SLP requested permanent Lingraphica device. Skilled ST is recommended to address aphasia and apraxia impacting pt's abilty to communicate effectively with both familiar and unfamiliar listeners to convey wants/needs, safety concerns, and participate in social situations.    Speech Therapy Frequency 2x / week    Duration 4 weeks    Treatment/Interventions Compensatory strategies;Patient/family education;Functional tasks;Cueing hierarchy;Environmental controls;Cognitive reorganization;Multimodal communcation approach;Language facilitation;Compensatory techniques;Internal/external aids;SLP instruction and feedback;Other (comment)    Potential to Achieve Goals Fair    Potential Considerations Severity of impairments;Previous level of function    SLP Home Exercise Plan provided    Consulted and Agree with Plan of Care Patient             Patient will benefit  from skilled therapeutic intervention in order to improve the following deficits and impairments:   Aphasia  Verbal apraxia    Problem List Patient Active Problem List   Diagnosis Date Noted   Pain due to onychomycosis of toenails of both feet 11/19/2020   Anemia 05/31/2020   Acute ischemic left MCA stroke (HCC) 04/30/2020   Right hemiplegia (HCC) 04/30/2020   CVA (cerebral vascular accident) (HCC) 04/21/2020   Unspecified atrial fibrillation (HCC) 04/21/2020   Essential hypertension 04/21/2020   Alcohol abuse 04/21/2020    Janann Colonel, MA CCC-SLP 07/13/2021, 5:34 PM  [facility] [address]  [place], [place], [address] Phone: [phone]   Fax:  [phone]   Name: Lindsey [name] MRN: [id number] Date of Birth: January 08, 1956

## 2021-07-13 NOTE — Patient Instructions (Signed)
  There are a couple things to sign for the Lingraphica device. If Lindsey [name] is unable to make it Thursday, I can call her and we can go over it on the phone

## 2021-07-15 ENCOUNTER — Ambulatory Visit: Payer: Medicare Other

## 2021-07-15 ENCOUNTER — Other Ambulatory Visit: Payer: Self-pay

## 2021-07-15 DIAGNOSIS — I69851 Hemiplegia and hemiparesis following other cerebrovascular disease affecting right dominant side: Secondary | ICD-10-CM

## 2021-07-15 DIAGNOSIS — R482 Apraxia: Secondary | ICD-10-CM

## 2021-07-15 DIAGNOSIS — R2689 Other abnormalities of gait and mobility: Secondary | ICD-10-CM | POA: Diagnosis not present

## 2021-07-15 DIAGNOSIS — R4701 Aphasia: Secondary | ICD-10-CM

## 2021-07-15 DIAGNOSIS — R2681 Unsteadiness on feet: Secondary | ICD-10-CM

## 2021-07-15 DIAGNOSIS — M6281 Muscle weakness (generalized): Secondary | ICD-10-CM

## 2021-07-15 NOTE — Therapy (Signed)
[facility] [address] [place], [place], [address] Phone: [phone]   Fax:  [phone]  Physical Therapy Treatment  Patient Details  Name: Lindsey [name] MRN: [id number] Date of Birth: 03-18-1956 Referring Provider (PT): Horton Chin, MD   Encounter Date: 07/15/2021   PT End of Session - 07/15/21 1732     Visit Number 15    Number of Visits 20    Date for PT Re-Evaluation 07/30/21    Authorization Type MCR/MCD    Authorization Time Period 10/4-10/28/22    Authorization - Visit Number 17    Authorization - Number of Visits 9    Progress Note Due on Visit 20    PT Start Time 1530    PT Stop Time 1615    PT Time Calculation (min) 45 min    Equipment Utilized During Treatment Gait belt    Activity Tolerance Patient tolerated treatment well    Behavior During Therapy WFL for tasks assessed/performed             Past Medical History:  Diagnosis Date   Diverticulosis    Hypertension    Uterine prolapse     History reviewed. No pertinent surgical history.  There were no vitals filed for this visit.   Subjective Assessment - 07/15/21 1533     Subjective No changes or falls to note, cannot state if she has been walking more frequently though    Patient is accompained by: Family member    Pertinent History HTN, Alcohol Abuse    Limitations Standing;Walking    How long can you sit comfortably? unlimited    How long can you stand comfortably? <5 min    How long can you walk comfortably? <35min    Patient Stated Goals Be able to walk    Pain Onset More than a month ago                               [facility] Adult PT Treatment/Exercise - 07/15/21 0001       Transfers   Transfers Sit to Stand;Stand to Sit;Stand Pivot Transfers    Sit to Stand 5: Supervision;4: Min guard    Stand to Sit 5: Supervision;4: Min guard    Stand Pivot Transfers 5: Supervision;4: Min guard    Comments  focus onplacing R foot/ankle under knee prior to standing which improved mechanics and aided in tansfer technique.  Practiced stand pivot transfers from mat to chair, 1x to each side but ensuring proper foot placement and technique having patient repeat when erring      Ambulation/Gait   Ambulation/Gait Yes    Ambulation/Gait Assistance 4: Min guard    Ambulation Distance (Feet) 115 Feet    Gait Pattern Step-to pattern;Poor foot clearance - right;Trunk flexed    Ambulation Surface Level;Indoor    Gait Comments 2 bouts of 124ft                 Balance Exercises - 07/15/21 0001       Balance Exercises: Standing   Standing, One Foot on a Step Eyes open;Limitations;4 inch    Standing, One Foot on a Step Limitations R foot on 4" block reaching L hand to therapists hand placed OH and forward of patient to encourage WS    Stepping Strategy Anterior;UE support    Stepping Strategy Limitations L sTep ups onto 4" block encouraging no UE support but  patient reluctant to release // bars on L even with PT assist and tactile support                  PT Short Term Goals - 07/07/21 0816       PT SHORT TERM GOAL #1   Title STGs=LTGs               PT Long Term Goals - 07/15/21 1609       PT LONG TERM GOAL #1   Title Patient will be independent with final HEP and walking program within the home with caregiver assistance if needed (LTGs due 07/30/21)    Baseline PT continues to make adjustments to HEP 07/06/21    Time 4    Period Weeks    Status On-going      PT LONG TERM GOAL #2   Title Patient to perform all transfers with SBA( except floor transfers)    Baseline CGA/MinA for STS transfers. 07/06/21 SBA for sit to stand, CGA for stand-pivot; 07/15/21 All transfers with SBA today, not yet consistent enough to complete goal    Time 4    Period Weeks    Status On-going      PT LONG TERM GOAL #3   Title Pt will ambulate 300' with SBQC SBA for improved household mobility.     Baseline 12ft in clinic with RW and R AFO using CGA.MinA. 07/06/21 115' with SBQC, R AFO and CGA    Time 4    Period Weeks    Status Revised      PT LONG TERM GOAL #4   Title Pt will increase gait speed from 0.47m/s to >0.90m/s for improved household mobility.    Baseline 06/15/21 0.45m/s. 07/06/21 0.70m/s    Time 4    Period Weeks    Status On-going      PT LONG TERM GOAL #5   Title Pt will decrease TUG form 46.93 sec to <40 sec for improved balance and functional mobility.    Baseline 07/06/21 46.93 sec    Time 4    Period Weeks    Status New                   Plan - 07/15/21 1733     Clinical Impression Statement Focused on transfer and gait training as well as WS onto RLE.  During gait attempted to have patient correct forward flexed posture and WB onto forefeet which initiated LOB, able to self correct when cued but no follow through noted.  Transfer training with patient insructed to position R ankle under knee to distribute weight evenly and discourage extensor tone and retropulsion moment.  Continued need of cuing due to limited follow through b/t sessions.    Personal Factors and Comorbidities Comorbidity 2    Comorbidities HTN, Alcohol Abuse, Diverticulosis    Examination-Activity Limitations Bed Mobility;Bathing;Sit;Transfers;Dressing;Stairs;Stand;Locomotion Level;Toileting    Examination-Participation Restrictions Occupation;Community Activity;Driving    Stability/Clinical Decision Making Evolving/Moderate complexity    Rehab Potential Good    PT Frequency 2x / week    PT Duration 4 weeks    PT Treatment/Interventions ADLs/Self Care Home Management;Electrical Stimulation;DME Instruction;Gait training;Stair training;Therapeutic activities;Functional mobility training;Therapeutic exercise;Balance training;Neuromuscular re-education;Patient/family education;Orthotic Fit/Training;Wheelchair mobility training;Manual techniques;Passive range of motion    PT Next Visit  Plan Instruct pt/family to purchase [facility] for home. Continue gait training working on increasing right foot clearance and left step length for more step-through pattern. NMR to work on weight shifting activities to  the right. Continue to practice transfers.  encourage trunk extension and posture    PT Home Exercise Plan Access Code: [email or web address]    Consulted and Agree with Plan of Care Patient;Family member/caregiver             Patient will benefit from skilled therapeutic intervention in order to improve the following deficits and impairments:  Abnormal gait, Decreased balance, Decreased mobility, Decreased endurance, Difficulty walking, Impaired sensation, Impaired UE functional use, Decreased strength, Decreased safety awareness, Decreased knowledge of use of DME, Decreased activity tolerance, Decreased range of motion, Impaired tone, Improper body mechanics  Visit Diagnosis: No diagnosis found.     Problem List Patient Active Problem List   Diagnosis Date Noted   Pain due to onychomycosis of toenails of both feet 11/19/2020   Anemia 05/31/2020   Acute ischemic left MCA stroke (HCC) 04/30/2020   Right hemiplegia (HCC) 04/30/2020   CVA (cerebral vascular accident) (HCC) 04/21/2020   Unspecified atrial fibrillation (HCC) 04/21/2020   Essential hypertension 04/21/2020   Alcohol abuse 04/21/2020    Hildred Laser, PT 07/15/2021, 5:38 PM   [facility] [address] [place], [place], [address] Phone: [phone]   Fax:  [phone]  Name: Lindsey [name] MRN: [id number] Date of Birth: March 14, 1956

## 2021-07-15 NOTE — Therapy (Signed)
[facility] [address] [place], [place], [address] Phone: [phone]   Fax:  [phone]  Speech Language Pathology Treatment  Patient Details  Name: Lindsey [name] MRN: [id number] Date of Birth: [birth year] Referring Provider (SLP): Carlis Abbott Drema Pry, MD   Encounter Date: 07/15/2021   End of Session - 07/15/21 1429     Visit Number 17    Number of Visits 25    Date for SLP Re-Evaluation 08/06/21    Authorization Type Medicare    SLP Start Time 1445    SLP Stop Time  1529    SLP Time Calculation (min) 44 min    Activity Tolerance Patient tolerated treatment well             Past Medical History:  Diagnosis Date   Diverticulosis    Hypertension    Uterine prolapse     No past surgical history on file.  There were no vitals filed for this visit.          ADULT SLP TREATMENT - 07/15/21 1428       General Information   Behavior/Cognition Alert;Cooperative;Pleasant mood      Treatment Provided   Treatment provided Cognitive-Linquistic      Cognitive-Linquistic Treatment   Treatment focused on Aphasia;Apraxia;Patient/family/caregiver education    Skilled Treatment SLP completed remainder of Lingraphica paperwork via telephone with daughter and submitted for permanent device. SLP conducted Lingraphica trials with clinic device for selecting family members and preferred activities of the day. Initial cues required to scroll to find other icons. Occasional min to mod A required to locate icons. Pt exhibits improved verbalizations with device modeling with occasional mod SLP cues required to correct apraxic errors or perseverations.      Assessment / Recommendations / Plan   Plan Continue with current plan of care      Progression Toward Goals   Progression toward goals Progressing toward goals              SLP Education - 07/15/21 1523     Education Details paperwork with daughter via  telephone    Person(s) Educated Patient    Methods Explanation;Demonstration;Verbal cues    Comprehension Verbalized understanding;Returned demonstration;Need further instruction;Verbal cues required              SLP Short Term Goals - 05/25/21 1522       SLP SHORT TERM GOAL #1   Title Pt will ID paraphasias/perseverations and attempt to correct errors with pause and retrial for 4/5 opportunities given occasional mod A over 2 sessions    Baseline 05-04-21, 05-20-21    Status Achieved      SLP SHORT TERM GOAL #2   Title Given verbal or visual prompt, pt will correctly ID written word/object/icon in field of 4-6 to communicate wants/needs with 75% accuracy given occasional mod A over 2 sessions    Baseline 05-04-21, 05-18-21    Status Achieved      SLP SHORT TERM GOAL #3   Title Pt will use multimodal/AAC to ID biographical information and personal interests given 2-6 choices with occasional mod A over 2 sessions    Baseline 05-18-21, 05-25-21    Status Achieved      SLP SHORT TERM GOAL #4   Title Pt will verbally approximate 3 significant words for 5/10 trials given occasional fading to rare mod A over 2 sessions    Baseline 05-20-21, 05-25-21    Status Achieved  SLP Long Term Goals - 07/15/21 1430       SLP LONG TERM GOAL #1   Title Pt will spontaneously use multimodal communication system for 4/5 opportunities to increase ability to express wants/needs given >2 choices over 2 sessions    Baseline 06-17-21, 07-06-21    Status Achieved      SLP LONG TERM GOAL #2   Title Pt will ID 80% of paraphasias/perseverations in a session and attempt to self-correct given occasional min A over 2 sessions    Baseline 06-17-21, 07-06-21    Status Achieved      SLP LONG TERM GOAL #3   Title Caregiver will appropriate cue patient when apraxic/aphasic errors occurs for 4/5 opportunities over 2 sessions    Time 3    Period Weeks    Status On-going   ongoing     SLP LONG TERM  GOAL #4   Title Pt/daughter will report improved communication effectiveness via PROM by 2 points by last ST session    Time 3    Period Weeks    Status On-going   ongoing     SLP LONG TERM GOAL #5   Title Pt/caregiver will demonstate how to use and modify Lingraphica device with rare min A over 4 sessions    Time 3    Period Weeks    Status On-going              Plan - 07/15/21 1429     Clinical Impression Statement Lindsey [name] presents for OPST intervention to address 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 SLP targeted further Lingraphica trials via clinic device, in which pt exhibited good comprehension with occasional repetition and self-corrections noted. SLP requested permanent Lingraphica device. Skilled ST is recommended to address aphasia and apraxia impacting pt's abilty to communicate effectively with both familiar and unfamiliar listeners to convey wants/needs, safety concerns, and participate in social situations.    Speech Therapy Frequency 2x / week    Duration 4 weeks    Treatment/Interventions Compensatory strategies;Patient/family education;Functional tasks;Cueing hierarchy;Environmental controls;Cognitive reorganization;Multimodal communcation approach;Language facilitation;Compensatory techniques;Internal/external aids;SLP instruction and feedback;Other (comment)    Potential to Achieve Goals Fair    Potential Considerations Severity of impairments;Previous level of function    SLP Home Exercise Plan provided    Consulted and Agree with Plan of Care Patient             Patient will benefit from skilled therapeutic intervention in order to improve the following deficits and impairments:   Verbal apraxia  Aphasia    Problem List Patient Active Problem List   Diagnosis Date Noted   Pain due to onychomycosis of toenails of both feet 11/19/2020   Anemia 05/31/2020   Acute ischemic left MCA stroke (HCC) 04/30/2020   Right hemiplegia (HCC)  04/30/2020   CVA (cerebral vascular accident) (HCC) 04/21/2020   Unspecified atrial fibrillation (HCC) 04/21/2020   Essential hypertension 04/21/2020   Alcohol abuse 04/21/2020    Janann Colonel, MA CCC-SLP 07/15/2021, 4:41 PM  [facility] [address] [place], [place], [address] Phone: [phone]   Fax:  [phone]   Name: Lindsey [name] MRN: [id number] Date of Birth: September 19, 1956

## 2021-07-19 ENCOUNTER — Telehealth: Payer: Self-pay

## 2021-07-19 NOTE — Telephone Encounter (Signed)
Patient's daughter needs a 90 day supply of Eliquis sent to [facility]. Please advise

## 2021-07-20 ENCOUNTER — Other Ambulatory Visit: Payer: Self-pay | Admitting: Physical Medicine and Rehabilitation

## 2021-07-20 ENCOUNTER — Ambulatory Visit: Payer: Medicare Other | Admitting: Occupational Therapy

## 2021-07-20 ENCOUNTER — Ambulatory Visit: Payer: Medicare Other

## 2021-07-20 MED ORDER — APIXABAN 5 MG PO TABS
5.0000 mg | ORAL_TABLET | Freq: Two times a day (BID) | ORAL | 0 refills | Status: DC
Start: 2021-07-20 — End: 2021-10-01

## 2021-07-20 NOTE — Telephone Encounter (Signed)
Patient's daughter notified.

## 2021-07-22 ENCOUNTER — Other Ambulatory Visit: Payer: Self-pay

## 2021-07-22 ENCOUNTER — Ambulatory Visit: Payer: Medicare Other

## 2021-07-22 ENCOUNTER — Ambulatory Visit: Payer: Medicare Other | Admitting: Occupational Therapy

## 2021-07-22 DIAGNOSIS — R482 Apraxia: Secondary | ICD-10-CM

## 2021-07-22 DIAGNOSIS — R2689 Other abnormalities of gait and mobility: Secondary | ICD-10-CM

## 2021-07-22 DIAGNOSIS — R278 Other lack of coordination: Secondary | ICD-10-CM

## 2021-07-22 DIAGNOSIS — M6281 Muscle weakness (generalized): Secondary | ICD-10-CM

## 2021-07-22 DIAGNOSIS — R2681 Unsteadiness on feet: Secondary | ICD-10-CM

## 2021-07-22 DIAGNOSIS — I69851 Hemiplegia and hemiparesis following other cerebrovascular disease affecting right dominant side: Secondary | ICD-10-CM

## 2021-07-22 DIAGNOSIS — R4701 Aphasia: Secondary | ICD-10-CM

## 2021-07-22 DIAGNOSIS — M25611 Stiffness of right shoulder, not elsewhere classified: Secondary | ICD-10-CM

## 2021-07-22 DIAGNOSIS — R41842 Visuospatial deficit: Secondary | ICD-10-CM

## 2021-07-22 DIAGNOSIS — R41840 Attention and concentration deficit: Secondary | ICD-10-CM

## 2021-07-22 DIAGNOSIS — R41844 Frontal lobe and executive function deficit: Secondary | ICD-10-CM

## 2021-07-22 NOTE — Patient Instructions (Signed)
Your Splint This splint should initially be fitted by a healthcare practitioner.  The healthcare practitioner is responsible for providing wearing instructions and precautions to the patient, other healthcare practitioners and care provider involved in the patient's care.  This splint was custom made for you. Please read the following instructions to learn about wearing and caring for your splint.  Precautions Should your splint cause any of the following problems, remove the splint immediately and contact your therapist/physician. Swelling Severe Pain Pressure Areas Stiffness Numbness  Do not wear your splint while operating machinery unless it has been fabricated for that purpose.  When To Wear Your Splint Where your splint according to your therapist/physician instructions. Daytime for 1 hours initially, then check skin, if no problems progress to wearing splint for 2-3 hours at a time. Stop wearing splint if there are any issues. If no problems after several days wearing during daytime, She can progress to night time wear.  Care and Cleaning of Your Splint Keep your splint away from open flames. Your splint will lose its shape in temperatures over 135 degrees Farenheit, ( in car windows, near radiators, ovens or in hot water).  Never make any adjustments to your splint, if the splint needs adjusting remove it and make an appointment to see your therapist. Your splint, including the cushion liner may be cleaned with soap and lukewarm water.  Do not immerse in hot water over 135 degrees Farenheit. Straps may be washed with soap and water, but do not moisten the self-adhesive portion. For ink or hard to remove spots use a scouring cleanser which contains chlorine.  Rinse the splint thoroughly after using chlorine cleanser.

## 2021-07-22 NOTE — Therapy (Signed)
[facility] [address] [place], [place], [address] Phone: [phone]   Fax:  [phone]  Speech Language Pathology Treatment  Patient Details  Name: Lindsey [name] MRN: [id number] Date of Birth: December 18, 1955 Referring Provider (SLP): Carlis Abbott Drema Pry, MD   Encounter Date: 07/22/2021   End of Session - 07/22/21 1616     Visit Number 18    Number of Visits 25    Date for SLP Re-Evaluation 08/06/21    Authorization Type Medicare    SLP Start Time 1616    SLP Stop Time  1700    SLP Time Calculation (min) 44 min    Activity Tolerance Patient tolerated treatment well             Past Medical History:  Diagnosis Date   Diverticulosis    Hypertension    Uterine prolapse     History reviewed. No pertinent surgical history.  There were no vitals filed for this visit.   Subjective Assessment - 07/22/21 1618     Subjective "nothing at all"    Currently in Pain? No/denies                   ADULT SLP TREATMENT - 07/22/21 1616       General Information   Behavior/Cognition Alert;Cooperative;Pleasant mood      Treatment Provided   Treatment provided Cognitive-Linquistic      Cognitive-Linquistic Treatment   Treatment focused on Aphasia;Apraxia;Patient/family/caregiver education    Skilled Treatment SLP targeted use of clinic Lingraphica device as pt awaiting permenant Lingraphica. SLP targeted comprehension, in which pt benefited from usual repetition and trials to aid comprehension this session. Pt was more impulsive this session compared to baseline, which also impacted performance. Pt benefited from cues to slow rate to aid processing and comprehension. With further training and trials, accuracy improved. Pt able to navigate to various pages with occasional prompting. Intermittent cues required to scroll and scan.      Assessment / Recommendations / Plan   Plan Continue with current plan of  care      Progression Toward Goals   Progression toward goals Progressing toward goals              SLP Education - 07/22/21 1727     Education Details awaiting device    Person(s) Educated Patient    Methods Explanation;Demonstration;Verbal cues    Comprehension Verbalized understanding;Returned demonstration;Need further instruction;Verbal cues required              SLP Short Term Goals - 05/25/21 1522       SLP SHORT TERM GOAL #1   Title Pt will ID paraphasias/perseverations and attempt to correct errors with pause and retrial for 4/5 opportunities given occasional mod A over 2 sessions    Baseline 05-04-21, 05-20-21    Status Achieved      SLP SHORT TERM GOAL #2   Title Given verbal or visual prompt, pt will correctly ID written word/object/icon in field of 4-6 to communicate wants/needs with 75% accuracy given occasional mod A over 2 sessions    Baseline 05-04-21, 05-18-21    Status Achieved      SLP SHORT TERM GOAL #3   Title Pt will use multimodal/AAC to ID biographical information and personal interests given 2-6 choices with occasional mod A over 2 sessions    Baseline 05-18-21, 05-25-21    Status Achieved      SLP SHORT TERM GOAL #4   Title  Pt will verbally approximate 3 significant words for 5/10 trials given occasional fading to rare mod A over 2 sessions    Baseline 05-20-21, 05-25-21    Status Achieved              SLP Long Term Goals - 07/22/21 1616       SLP LONG TERM GOAL #1   Title Pt will spontaneously use multimodal communication system for 4/5 opportunities to increase ability to express wants/needs given >2 choices over 2 sessions    Baseline 06-17-21, 07-06-21    Status Achieved      SLP LONG TERM GOAL #2   Title Pt will ID 80% of paraphasias/perseverations in a session and attempt to self-correct given occasional min A over 2 sessions    Baseline 06-17-21, 07-06-21    Status Achieved      SLP LONG TERM GOAL #3   Title Caregiver will  appropriate cue patient when apraxic/aphasic errors occurs for 4/5 opportunities over 2 sessions    Time 2    Period Weeks    Status On-going   ongoing     SLP LONG TERM GOAL #4   Title Pt/daughter will report improved communication effectiveness via PROM by 2 points by last ST session    Time 2    Period Weeks    Status On-going   ongoing     SLP LONG TERM GOAL #5   Title Pt/caregiver will demonstate how to use and modify Lingraphica device with rare min A over 4 sessions    Time 2    Period Weeks    Status On-going              Plan - 07/22/21 1616     Clinical Impression Statement [name] presents for OPST intervention to address 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 SLP targeted further Lingraphica trials via clinic device, in which pt exhibited improved comprehension given increased training and trials. Occasional self-corrections noted.  Pt is currently awaiting permanent Lingraphica device. Skilled ST is recommended to address aphasia and apraxia impacting pt's abilty to communicate effectively with both familiar and unfamiliar listeners to convey wants/needs, safety concerns, and participate in social situations.    Speech Therapy Frequency 2x / week    Duration 4 weeks    Treatment/Interventions Compensatory strategies;Patient/family education;Functional tasks;Cueing hierarchy;Environmental controls;Cognitive reorganization;Multimodal communcation approach;Language facilitation;Compensatory techniques;Internal/external aids;SLP instruction and feedback;Other (comment)    Potential to Achieve Goals Fair    Potential Considerations Severity of impairments;Previous level of function    SLP Home Exercise Plan provided    Consulted and Agree with Plan of Care Patient             Patient will benefit from skilled therapeutic intervention in order to improve the following deficits and impairments:   Aphasia  Verbal apraxia    Problem List Patient Active  Problem List   Diagnosis Date Noted   Pain due to onychomycosis of toenails of both feet 11/19/2020   Anemia 05/31/2020   Acute ischemic left MCA stroke (HCC) 04/30/2020   Right hemiplegia (HCC) 04/30/2020   CVA (cerebral vascular accident) (HCC) 04/21/2020   Unspecified atrial fibrillation (HCC) 04/21/2020   Essential hypertension 04/21/2020   Alcohol abuse 04/21/2020    Janann Colonel, MA CCC-SLP 07/22/2021, 5:28 PM  [facility] [address] [place], [place], [address] Phone: [phone]   Fax:  [phone]   Name: Deiona Hooper MRN: [id number] Date of Birth: 1956/08/23

## 2021-07-22 NOTE — Therapy (Signed)
[facility] [address] [place], [place], [address] Phone: [phone]   Fax:  [phone]  Physical Therapy Treatment  Patient Details  Name: Lindsey [name] MRN: [id number] Date of Birth: 18-Oct-1955 Referring Provider (PT): Horton Chin, MD   Encounter Date: 07/22/2021   PT End of Session - 07/22/21 1720     Visit Number 17    Number of Visits 20    Date for PT Re-Evaluation 07/30/21    Authorization Type MCR/MCD    Authorization Time Period 10/4-10/28/22    Authorization - Visit Number 17    Authorization - Number of Visits 9    Progress Note Due on Visit 20    PT Start Time 1530    PT Stop Time 1615    PT Time Calculation (min) 45 min    Equipment Utilized During Treatment Gait belt    Activity Tolerance Patient tolerated treatment well    Behavior During Therapy [facility] for tasks assessed/performed             Past Medical History:  Diagnosis Date   Diverticulosis    Hypertension    Uterine prolapse     History reviewed. No pertinent surgical history.  There were no vitals filed for this visit.   Subjective Assessment - 07/22/21 1603     Subjective Daughter  repoorts htings are going well at home.  They will obtain a SBQC for use at home    Patient is accompained by: Family member    Pertinent History HTN, Alcohol Abuse    Limitations Standing;Walking    How long can you sit comfortably? unlimited    How long can you stand comfortably? <5 min    How long can you walk comfortably? <76min    Patient Stated Goals Be able to walk    Pain Onset More than a month ago                               [facility] Adult PT Treatment/Exercise - 07/22/21 0001       Transfers   Transfers Sit to Stand;Stand to Sit;Stand Pivot Transfers    Sit to Stand 5: Supervision;4: Min guard    Stand to Sit 5: Supervision;4: Min guard    Stand Pivot Transfers 5: Supervision;4: Min guard     Comments continued emphasis on placing R foot under knee, performed 5x      Ambulation/Gait   Ambulation/Gait Yes    Ambulation/Gait Assistance 4: Min guard    Ambulation Distance (Feet) 230 Feet    Gait Pattern Step-to pattern;Poor foot clearance - right;Trunk flexed    Ambulation Surface Level;Indoor    Gait Comments 2 bouts of 144ft                 Balance Exercises - 07/22/21 0001       Balance Exercises: Standing   Standing, One Foot on a Step Eyes open;Limitations;4 inch    Standing, One Foot on a Step Limitations each foot on 4" block reaching OH with LUE 15x ea.    Stepping Strategy Anterior;UE support    Stepping Strategy Limitations step taps with 2" block 10x ea.    Retro Gait Upper extremity support;5 reps;Limitations    Retro Gait Limitations 5 trips in // bars forward and backward                  PT Short  Term Goals - 07/07/21 0816       PT SHORT TERM GOAL #1   Title STGs=LTGs               PT Long Term Goals - 07/15/21 1609       PT LONG TERM GOAL #1   Title Patient will be independent with final HEP and walking program within the home with caregiver assistance if needed (LTGs due 07/30/21)    Baseline PT continues to make adjustments to HEP 07/06/21    Time 4    Period Weeks    Status On-going      PT LONG TERM GOAL #2   Title Patient to perform all transfers with SBA( except floor transfers)    Baseline CGA/MinA for STS transfers. 07/06/21 SBA for sit to stand, CGA for stand-pivot; 07/15/21 All transfers with SBA today, not yet consistent enough to complete goal    Time 4    Period Weeks    Status On-going      PT LONG TERM GOAL #3   Title Pt will ambulate 300' with SBQC SBA for improved household mobility.    Baseline 145ft in clinic with RW and R AFO using CGA.MinA. 07/06/21 115' with SBQC, R AFO and CGA    Time 4    Period Weeks    Status Revised      PT LONG TERM GOAL #4   Title Pt will increase gait speed from 0.59m/s  to >0.54m/s for improved household mobility.    Baseline 06/15/21 0.42m/s. 07/06/21 0.9m/s    Time 4    Period Weeks    Status On-going      PT LONG TERM GOAL #5   Title Pt will decrease TUG form 46.93 sec to <40 sec for improved balance and functional mobility.    Baseline 07/06/21 46.93 sec    Time 4    Period Weeks    Status New                   Plan - 07/22/21 1612     Clinical Impression Statement Continued focus on gait and transfer training as daughter was n attendence. emphasizing treatment goals of safe and efficient transfers including full turns with stand pivot transfers as well as looking forward when ambulating.  Instructed to obtain [facility] for mobility needs.  Advanced with gait tasks to compliant surface, retrowalking and reviewed stairs.    Personal Factors and Comorbidities Comorbidity 2    Comorbidities HTN, Alcohol Abuse, Diverticulosis    Examination-Activity Limitations Bed Mobility;Bathing;Sit;Transfers;Dressing;Stairs;Stand;Locomotion Level;Toileting    Examination-Participation Restrictions Occupation;Community Activity;Driving    Stability/Clinical Decision Making Evolving/Moderate complexity    Rehab Potential Good    PT Frequency 2x / week    PT Duration 4 weeks    PT Treatment/Interventions ADLs/Self Care Home Management;Electrical Stimulation;DME Instruction;Gait training;Stair training;Therapeutic activities;Functional mobility training;Therapeutic exercise;Balance training;Neuromuscular re-education;Patient/family education;Orthotic Fit/Training;Wheelchair mobility training;Manual techniques;Passive range of motion    PT Next Visit Plan reinforce gait and transfer basics for upcomoing DC    PT Home Exercise Plan Access Code: [email or web address]    Consulted and Agree with Plan of Care Patient;Family member/caregiver             Patient will benefit from skilled therapeutic intervention in order to improve the following deficits and impairments:   Abnormal gait, Decreased balance, Decreased mobility, Decreased endurance, Difficulty walking, Impaired sensation, Impaired UE functional use, Decreased strength, Decreased safety awareness, Decreased knowledge of use of DME, Decreased activity  tolerance, Decreased range of motion, Impaired tone, Improper body mechanics  Visit Diagnosis: Unsteadiness on feet  Muscle weakness (generalized)  Other abnormalities of gait and mobility     Problem List Patient Active Problem List   Diagnosis Date Noted   Pain due to onychomycosis of toenails of both feet 11/19/2020   Anemia 05/31/2020   Acute ischemic left MCA stroke (HCC) 04/30/2020   Right hemiplegia (HCC) 04/30/2020   CVA (cerebral vascular accident) (HCC) 04/21/2020   Unspecified atrial fibrillation (HCC) 04/21/2020   Essential hypertension 04/21/2020   Alcohol abuse 04/21/2020    Hildred Laser, PT 07/22/2021, 5:23 PM  [facility] [address] [place], [place], [address] Phone: [phone]   Fax:  [phone]  Name: Lindsey [name] MRN: [id number] Date of Birth: 1955/12/18

## 2021-07-23 ENCOUNTER — Ambulatory Visit: Payer: Medicare Other

## 2021-07-23 ENCOUNTER — Encounter: Payer: Medicare Other | Admitting: Occupational Therapy

## 2021-07-23 NOTE — Therapy (Signed)
[facility] [address] [place], [place], [address] Phone: [phone]   Fax:  [phone]  Occupational Therapy Treatment  Patient Details  Name: Lindsey [name] MRN: [id number] Date of Birth: [birth year] Referring Provider (OT): Leeroy Cha, MD   Encounter Date: 07/22/2021   OT End of Session - 07/22/21 1545     Visit Number 17    Number of Visits 24    Authorization Type Medicare/ Medicaid    Authorization Time Period Pt has used all MCD visits    Authorization - Visit Number 17    Authorization - Number of Visits 20    Progress Note Due on Visit 20    OT Start Time 1453    OT Stop Time 1531    OT Time Calculation (min) 38 min    Activity Tolerance Patient tolerated treatment well    Behavior During Therapy [facility] for tasks assessed/performed             Past Medical History:  Diagnosis Date   Diverticulosis    Hypertension    Uterine prolapse     No past surgical history on file.  There were no vitals filed for this visit.                 Treatment: Therapist continued fabrication/ fitting of resting hand splint. Additional time required for fitting due to spasticity. Pt performed self ROM table slides and shoulder flexion self ROM reaching to floor. Pt performed transfer to and from plinth with supervision and min v.c for hand and foot placement and and minguard back to w/c. Pt's dtr agreed to adding 2 additional visits to the schedule. 08/03/21 visit is only in case splint is not fitting well and additional time needed. Pt/ dtr agree with wrapping up in next several visits. Pt's dtr asked about when pt could return to therapy. Therapist discussed with pt/ dtr that there would need to be a change in status to return and MD would need to order additional visits.          OT Short Term Goals - 07/06/21 1424       OT SHORT TERM GOAL #1   Title Pt and caregiver will be  independent with updated HEP    Baseline not issued yet    Time 4    Period Weeks    Status Achieved      OT SHORT TERM GOAL #2   Title Pt will donn pants with min A    Baseline not reviewed/issued    Time 4    Status Achieved      OT SHORT TERM GOAL #3   Title Pt will consistently demonstrate 30* active elbow flexion  in prep for  functional use.    Baseline trace elbow flexion    Time 4    Period Weeks    Status Not Met   Has ~15 degrees of active flexion inconsistently following facilitation     OT SHORT TERM GOAL #4   Title Pt consistently demonstrate 30 wrist extension in prep for functional use of RUE.    Baseline trace wrist extension    Time 4    Period Weeks    Status Achieved      OT SHORT TERM GOAL #5   Status Achieved      OT SHORT TERM GOAL #6   Title Pt will demonstrate 90* passive shoulder flexion without pain    Baseline 75*  Time 4    Period Weeks    Status Achieved      OT SHORT TERM GOAL #7   Title I with adapted equipment/ adapted strategies for ADLS(ie rocker knife)    Baseline dependent    Time 4    Period Weeks    Status Achieved   in clinic environment able to use rocker knife              OT Long Term Goals - 07/06/21 1425       OT LONG TERM GOAL #1   Title Pt and caregiver will be independent with updated HEP 08/05/21    Baseline not issued    Period Weeks    Status On-going    Target Date 08/05/21      OT LONG TERM GOAL #2   Title Pt will demonstrate ability to stand and pull up her pants with close supervision.    Baseline min A for standing, mod A for LB dressing    Time 12    Period Weeks    Status On-going   Able to complete in simulated condition in clinic - needs to try at home with daughter     OT LONG TERM GOAL #3   Title Pt will use RUE as a stabilizer/ gross assist consistently at least 75% of the time during ADLS with min prompts    Baseline uses grossly 50% as stabilizer    Time 12    Period Weeks     Status Not Met      OT LONG TERM GOAL #4   Title Pt will perform LB bathing with min A    Baseline mod A    Time 12    Period Weeks    Status On-going      OT LONG TERM GOAL #5   Title Pt will perform simple snack or beverage prep with close supervision.    Baseline not currently performing    Time 12    Period Weeks    Status On-going      Long Term Additional Goals   Additional Long Term Goals Yes      OT LONG TERM GOAL #6   Title Patient will transfer to bedside commode or toilet with supervision.    Time 4    Period Weeks    Status New    Target Date 08/05/21                   Plan - 07/23/21 0744     Clinical Impression Statement Therapist  completed fitting of resting hand splint replacement as previous on cracked. Pt/ dtr were educated in wear and care schedule that pt should wear splint initally during the day and if no problems after gradually increasing wear time she can transition to night time wear.    OT Occupational Profile and History Problem Focused Assessment - Including review of records relating to presenting problem    Occupational performance deficits (Please refer to evaluation for details): ADL's;IADL's;Leisure    Body Structure / Function / Physical Skills ADL;Decreased knowledge of use of DME;Strength;Gait;GMC;Tone;Pain;Dexterity;Balance;UE functional use;Proprioception;ROM;IADL;Vision;Sensation;Coordination;FMC;Flexibility;Decreased knowledge of precautions    Cognitive Skills Attention;Energy/Drive;Perception;Understand;Sequencing;Problem Solve;Memory;Safety Awareness;Thought    Rehab Potential Good    Clinical Decision Making Limited treatment options, no task modification necessary    Comorbidities Affecting Occupational Performance: None    Modification or Assistance to Complete Evaluation  No modification of tasks or assist necessary to complete eval    OT  Frequency 2x / week    OT Duration 4 weeks   4 additional weeks   OT  Treatment/Interventions Self-care/ADL training;Moist Heat;DME and/or AE instruction;Balance training;Aquatic Therapy;Therapeutic activities;Cognitive remediation/compensation;Therapeutic exercise;Visual/perceptual remediation/compensation;Passive range of motion;Neuromuscular education;Functional Mobility Training;Cryotherapy;Electrical Stimulation;Energy conservation;Manual Therapy;Patient/family education;Ultrasound;Paraffin;Fluidtherapy;Splinting    Plan splint check and modifications,  transfers with quad cane , address remaining goals and d/c if splint is working well. (Pt hs 1 additional visit scheduled just in cast there are issues with splint)    OT Home Exercise Plan Self-PROM seated shoulder    Recommended Other Services Pt receiving ST and PT    Consulted and Agree with Plan of Care Patient             Patient will benefit from skilled therapeutic intervention in order to improve the following deficits and impairments:   Body Structure / Function / Physical Skills: ADL, Decreased knowledge of use of DME, Strength, Gait, GMC, Tone, Pain, Dexterity, Balance, UE functional use, Proprioception, ROM, IADL, Vision, Sensation, Coordination, FMC, Flexibility, Decreased knowledge of precautions Cognitive Skills: Attention, Energy/Drive, Perception, Understand, Sequencing, Problem Solve, Memory, Safety Awareness, Thought     Visit Diagnosis: 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)  Unsteadiness on feet  Muscle weakness (generalized)  Other abnormalities of gait and mobility  Stiffness of right shoulder, not elsewhere classified  Visuospatial deficit  Other lack of coordination  Attention and concentration deficit  Frontal lobe and executive function deficit    Problem List Patient Active Problem List   Diagnosis Date Noted   Pain due to onychomycosis of toenails of both feet 11/19/2020   Anemia 05/31/2020    Acute ischemic left MCA stroke ([place]) 04/30/2020   Right hemiplegia ([place]) 04/30/2020   CVA (cerebral vascular accident) ([place]) 04/21/2020   Unspecified atrial fibrillation ([place]) 04/21/2020   Essential hypertension 04/21/2020   Alcohol abuse 04/21/2020    Wlliam Grosso, OT/L 07/23/2021, 7:48 AM  [facility] [address] [place], [place], [address] Phone: [phone]   Fax:  [phone]  Name: Sunset Joshi MRN: [id number] Date of Birth: 1956/09/06

## 2021-07-29 ENCOUNTER — Ambulatory Visit: Payer: Medicare Other | Admitting: Occupational Therapy

## 2021-07-29 ENCOUNTER — Encounter: Payer: Self-pay | Admitting: Occupational Therapy

## 2021-07-29 ENCOUNTER — Ambulatory Visit: Payer: Medicare Other

## 2021-07-29 ENCOUNTER — Other Ambulatory Visit: Payer: Self-pay

## 2021-07-29 DIAGNOSIS — M6281 Muscle weakness (generalized): Secondary | ICD-10-CM

## 2021-07-29 DIAGNOSIS — M25611 Stiffness of right shoulder, not elsewhere classified: Secondary | ICD-10-CM

## 2021-07-29 DIAGNOSIS — R2681 Unsteadiness on feet: Secondary | ICD-10-CM

## 2021-07-29 DIAGNOSIS — I69851 Hemiplegia and hemiparesis following other cerebrovascular disease affecting right dominant side: Secondary | ICD-10-CM

## 2021-07-29 DIAGNOSIS — R278 Other lack of coordination: Secondary | ICD-10-CM

## 2021-07-29 DIAGNOSIS — R41840 Attention and concentration deficit: Secondary | ICD-10-CM

## 2021-07-29 DIAGNOSIS — R2689 Other abnormalities of gait and mobility: Secondary | ICD-10-CM | POA: Diagnosis not present

## 2021-07-29 DIAGNOSIS — R41842 Visuospatial deficit: Secondary | ICD-10-CM

## 2021-07-29 NOTE — Therapy (Signed)
[facility] [address] [place], [place], [address] Phone: [phone]   Fax:  [phone]  Occupational Therapy Treatment and Discharge Summary  Patient Details  Name: Lindsey [name] MRN: [id number] Date of Birth: 1955-11-09 Referring Provider (OT): Leeroy Cha, MD   Encounter Date: 07/29/2021   OT End of Session - 07/29/21 1701     Visit Number 18    Number of Visits 24    Date for OT Re-Evaluation 08/05/21    Authorization Type Medicare/ Medicaid    Authorization Time Period Pt has used all MCD visits    Authorization - Visit Number 18    Authorization - Number of Visits 20    Progress Note Due on Visit 20    OT Start Time 1615    OT Stop Time 1655    OT Time Calculation (min) 40 min    Activity Tolerance Patient tolerated treatment well    Behavior During Therapy [facility] for tasks assessed/performed             Past Medical History:  Diagnosis Date   Diverticulosis    Hypertension    Uterine prolapse     History reviewed. No pertinent surgical history.  There were no vitals filed for this visit.   Subjective Assessment - 07/29/21 1655     Subjective  Patient's daughter indicates that patient reported discomfort in right thumb in splint as they are ramping up time in splint    Patient is accompanied by: Family member    Pertinent History PMH significant for HTN, EtOH abuse, diverticulosis, CVA, July 2021, Botox June 2021    Limitations Fall Risk. Not Driving. Aphasia. Sensory deficits RUE. Apraxia.    Patient Stated Goals trying to get better    Currently in Pain? No/denies    Pain Score 0-No pain                          OT Treatments/Exercises (OP) - 07/29/21 0001       ADLs   Toileting Sit to stand with increased emphasis on forward flexion at hips.  Worked on same for stand to sit as patient with limited flexion forward at hips, then landing at very back of chair at fast  rate. Patient able to carryover with cue to bow before sitting. Daughter present and aware of desired change.      Splinting   Splinting Made modificationm to restin ghand splint to reduce stretch to web space/thumb.  Patient tolerated new position while in clinic.                    OT Education - 07/29/21 1701     Education Details discharge - patient and daughter in agreement    Person(s) Educated Patient;Child(ren)    Methods Explanation    Comprehension Verbalized understanding              OT Short Term Goals - 07/29/21 1736       OT SHORT TERM GOAL #1   Title Pt and caregiver will be independent with updated HEP    Baseline not issued yet    Time 4    Period Weeks    Status Achieved      OT SHORT TERM GOAL #2   Title Pt will donn pants with min A    Baseline not reviewed/issued    Time 4    Status Achieved  OT SHORT TERM GOAL #3   Title Pt will consistently demonstrate 30* active elbow flexion  in prep for  functional use.    Baseline trace elbow flexion    Time 4    Period Weeks    Status Not Met   Has ~15 degrees of active flexion inconsistently following facilitation     OT SHORT TERM GOAL #4   Title Pt consistently demonstrate 30 wrist extension in prep for functional use of RUE.    Baseline trace wrist extension    Time 4    Period Weeks    Status Achieved      OT SHORT TERM GOAL #5   Status Achieved      OT SHORT TERM GOAL #6   Title Pt will demonstrate 90* passive shoulder flexion without pain    Baseline 75*    Time 4    Period Weeks    Status Achieved      OT SHORT TERM GOAL #7   Title I with adapted equipment/ adapted strategies for ADLS(ie rocker knife)    Baseline dependent    Time 4    Period Weeks    Status Achieved   in clinic environment able to use rocker knife              OT Long Term Goals - 07/29/21 1640       OT LONG TERM GOAL #1   Title Pt and caregiver will be independent with updated HEP  08/05/21    Baseline not issued    Period Weeks    Status Achieved      OT LONG TERM GOAL #2   Title Pt will demonstrate ability to stand and pull up her pants with close supervision.    Baseline min A for standing, mod A for LB dressing    Time 12    Period Weeks    Status Achieved   Able to complete in simulated condition in clinic - needs to try at home with daughter     OT LONG TERM GOAL #3   Title Pt will use RUE as a stabilizer/ gross assist consistently at least 75% of the time during ADLS with min prompts    Baseline uses grossly 50% as stabilizer    Time 12    Period Weeks    Status Not Met      OT LONG TERM GOAL #4   Title Pt will perform LB bathing with min A    Baseline mod A    Time 12    Period Weeks    Status Achieved      OT LONG TERM GOAL #5   Title Pt will perform simple snack or beverage prep with close supervision.    Baseline not currently performing    Time 12    Period Weeks    Status Not Met      OT LONG TERM GOAL #6   Title Patient will transfer to bedside commode or toilet with supervision.    Time 4    Period Weeks    Status Achieved                   Plan - 07/29/21 1735     Clinical Impression Statement Reviewed remaining goals with patient and daughter.  Patient is ready for OT discharge.    OT Occupational Profile and History Problem Focused Assessment - Including review of records relating to presenting problem    Occupational performance  deficits (Please refer to evaluation for details): ADL's;IADL's;Leisure    Body Structure / Function / Physical Skills ADL;Decreased knowledge of use of DME;Strength;Gait;GMC;Tone;Pain;Dexterity;Balance;UE functional use;Proprioception;ROM;IADL;Vision;Sensation;Coordination;FMC;Flexibility;Decreased knowledge of precautions    Cognitive Skills Attention;Energy/Drive;Perception;Understand;Sequencing;Problem Solve;Memory;Safety Awareness;Thought    Rehab Potential Good    Clinical Decision  Making Limited treatment options, no task modification necessary    Comorbidities Affecting Occupational Performance: None    Modification or Assistance to Complete Evaluation  No modification of tasks or assist necessary to complete eval    OT Frequency 2x / week    OT Duration 4 weeks   4 additional weeks   OT Treatment/Interventions Self-care/ADL training;Moist Heat;DME and/or AE instruction;Balance training;Aquatic Therapy;Therapeutic activities;Cognitive remediation/compensation;Therapeutic exercise;Visual/perceptual remediation/compensation;Passive range of motion;Neuromuscular education;Functional Mobility Training;Cryotherapy;Electrical Stimulation;Energy conservation;Manual Therapy;Patient/family education;Ultrasound;Paraffin;Fluidtherapy;Splinting    Plan discharge    OT Home Exercise Plan Self-PROM seated shoulder    Recommended Other Services Pt receiving ST and PT    Consulted and Agree with Plan of Care Patient             Patient will benefit from skilled therapeutic intervention in order to improve the following deficits and impairments:   Body Structure / Function / Physical Skills: ADL, Decreased knowledge of use of DME, Strength, Gait, GMC, Tone, Pain, Dexterity, Balance, UE functional use, Proprioception, ROM, IADL, Vision, Sensation, Coordination, FMC, Flexibility, Decreased knowledge of precautions Cognitive Skills: Attention, Energy/Drive, Perception, Understand, Sequencing, Problem Solve, Memory, Safety Awareness, Thought     Visit Diagnosis: Unsteadiness on feet  Muscle weakness (generalized)  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)  Stiffness of right shoulder, not elsewhere classified  Visuospatial deficit  Other lack of coordination  Attention and concentration deficit    Problem List Patient Active Problem List   Diagnosis Date Noted   Pain due to onychomycosis of toenails of both  feet 11/19/2020   Anemia 05/31/2020   Acute ischemic left MCA stroke ([place]) 04/30/2020   Right hemiplegia ([place]) 04/30/2020   CVA (cerebral vascular accident) ([place]) 04/21/2020   Unspecified atrial fibrillation ([place]) 04/21/2020   Essential hypertension 04/21/2020   Alcohol abuse 04/21/2020  OCCUPATIONAL THERAPY DISCHARGE SUMMARY  Visits from Start of Care: 18  Current functional level related to goals / functional outcomes: Improved participation with ADL/functional mobility   Remaining deficits: Spastic hemiplegia, aphasia   Education / Equipment: Splint wear and care, ADL compensatory strategies   Patient agrees to discharge. Patient goals were partially met. Patient is being discharged due to meeting the stated rehab goals.Lindsey [name], OT/L 07/29/2021, 5:48 PM  [facility] [address] [place], [place], [address] Phone: [phone]   Fax:  [phone]  Name: Lindsey [name] MRN: [id number] Date of Birth: 1956/06/25

## 2021-07-30 NOTE — Therapy (Signed)
[facility] [address] [place], [place], [address] Phone: [phone]   Fax:  [phone]  Physical Therapy Treatment/DC Summary  Patient Details  Name: Lindsey [name] MRN: [id number] Date of Birth: December 06, 1955 Referring Provider (PT): Izora Ribas, MD   Encounter Date: 07/29/2021 PHYSICAL THERAPY DISCHARGE SUMMARY  Visits from Start of Care: 18  Current functional level related to goals / functional outcomes: Goals partially met, requires supervision for mobility   Remaining deficits: R hemiparesis   Education / Equipment: HEP   Patient agrees to discharge. Patient goals were partially met. Patient is being discharged due to being pleased with the current functional level.   PT End of Session - 07/29/21 0802     Visit Number 18    Number of Visits 20    Date for PT Re-Evaluation 07/30/21    Authorization Type MCR/MCD    Authorization Time Period 10/4-10/28/22    Authorization - Visit Number 64    Authorization - Number of Visits 9    Progress Note Due on Visit 20    PT Start Time 1700    PT Stop Time 1745    PT Time Calculation (min) 45 min    Equipment Utilized During Treatment Gait belt    Activity Tolerance Patient tolerated treatment well    Behavior During Therapy WFL for tasks assessed/performed             Past Medical History:  Diagnosis Date   Diverticulosis    Hypertension    Uterine prolapse     History reviewed. No pertinent surgical history.  There were no vitals filed for this visit.   Subjective Assessment - 07/29/21 1702     Subjective No changes to note, no falls.  Patient and daughter ready for DC    Patient is accompained by: Family member    Pertinent History HTN, Alcohol Abuse    Limitations Standing;Walking    How long can you sit comfortably? unlimited    How long can you stand comfortably? <5 min    How long can you walk comfortably? <56mn    Patient  Stated Goals Be able to walk    Currently in Pain? No/denies    Pain Onset More than a month ago                               [facility]       Transfers   Transfers Sit to Stand;Stand to Sit;Stand Pivot Transfers    Sit to Stand 5: Supervision;4: Min guard    Stand to Sit 5: Supervision;4: Min guard    Stand Pivot Transfers 5: Supervision;4: Min guard    Comments Cs to maintain RLE postreior      Ambulation/Gait   Ambulation/Gait Yes    Ambulation/Gait Assistance 4: Min guard    Ambulation/Gait Assistance Details VCs for sequence, cadence and posture    Ambulation Distance (Feet) 115 Feet    Gait Pattern Step-to pattern;Poor foot clearance - right;Trunk flexed    Ambulation Surface Level;Indoor    Gait velocity 0.17 m/s    Stairs Yes    Stairs Assistance 4: Min guard    Stair Management Technique One rail Left;Step to pattern      Standardized Balance Assessment   Standardized Balance Assessment Timed Up and Go Test      Timed Up and Go Test  TUG Normal TUG    Normal TUG (seconds) 54.3                     PT Education - 07/30/21 0801     Education Details Reviewed safe transfers and ambulation strategies to maintain upright posture    Person(s) Educated Patient;Child(ren)    Methods Explanation;Demonstration    Comprehension Verbalized understanding;Verbal cues required;Tactile cues required              PT Short Term Goals - 07/07/21 0816       PT SHORT TERM GOAL #1   Title STGs=LTGs               PT Long Term Goals - 07/29/21 1713       PT LONG TERM GOAL #1   Title Patient will be independent with final HEP and walking program within the home with caregiver assistance if needed (LTGs due 07/30/21)    Baseline PT continues to make adjustments to HEP 07/06/21; 10/29/20 HEP finalized with DTR    Time 4    Period Weeks    Status Achieved      PT LONG TERM GOAL #2   Title  Patient to perform all transfers with SBA( except floor transfers)    Baseline CGA/MinA for STS transfers. 07/06/21 SBA for sit to stand, CGA for stand-pivot; 07/15/21 All transfers with SBA today, not yet consistent enough to complete goal; 07/29/21 Patient abs to perform STS trnfers with SBA to enforce safety    Time 4    Period Weeks    Status Achieved      PT LONG TERM GOAL #3   Title Pt will ambulate 300' with SBQC SBA for improved household mobility.    Baseline 123f in clinic with RW and R AFO using CGA.MinA. 07/06/21 115' with SBQC, R AFO and CGA; 07/29/21 No change in ambulation distnce due to fatigue, distance limited to [id number] to fatigue and safety concerns    Time 4    Period Weeks    Status Partially Met      PT LONG TERM GOAL #4   Title Pt will increase gait speed from 0.2349mto >0.51m38mor improved household mobility.    Baseline 06/15/21 0.43m/71m0/4/22 0.43m/s17m/27/22 Gait speed 0.17 m/s today    Time 4    Period Weeks    Status Partially Met      PT LONG TERM GOAL #5   Title Pt will decrease TUG form 46.93 sec to <40 sec for improved balance and functional mobility.    Baseline 07/06/21 46.93 sec; 07/29/21 TUG time 54.3s    Time 4    Period Weeks    Status Partially Met                   Plan - 07/30/21 0803     Clinical Impression Statement Todays session assessed progress towards goals and readiness for DC from OPPT.  Patient currently at maximum rehab potential as evidenced by goal progress    Personal Factors and Comorbidities Comorbidity 2    Comorbidities HTN, Alcohol Abuse, Diverticulosis    Examination-Activity Limitations Bed Mobility;Bathing;Sit;Transfers;Dressing;Stairs;Stand;Locomotion Level;Toileting    Examination-Participation Restrictions Occupation;Community Activity;Driving    Stability/Clinical Decision Making Evolving/Moderate complexity    Rehab Potential Good    PT Frequency 2x / week    PT Duration 4 weeks    PT  Treatment/Interventions ADLs/Self Care Home Management;Electrical Stimulation;DME Instruction;Gait training;Stair training;Therapeutic activities;Functional mobility training;Therapeutic  exercise;Balance training;Neuromuscular re-education;Patient/family education;Orthotic Fit/Training;Wheelchair mobility training;Manual techniques;Passive range of motion    PT Next Visit Plan DC to HEP    PT Home Exercise Plan Access Code: [email or web address]    Consulted and Agree with Plan of Care Patient;Family member/caregiver             Patient will benefit from skilled therapeutic intervention in order to improve the following deficits and impairments:  Abnormal gait, Decreased balance, Decreased mobility, Decreased endurance, Difficulty walking, Impaired sensation, Impaired UE functional use, Decreased strength, Decreased safety awareness, Decreased knowledge of use of DME, Decreased activity tolerance, Decreased range of motion, Impaired tone, Improper body mechanics  Visit Diagnosis: Unsteadiness on feet  Muscle weakness (generalized)  Hemiplegia and hemiparesis following other cerebrovascular disease affecting right dominant side [facility])     Problem List Patient Active Problem List   Diagnosis Date Noted   Pain due to onychomycosis of toenails of both feet 11/19/2020   Anemia 05/31/2020   Acute ischemic left MCA stroke ([place]) 04/30/2020   Right hemiplegia ([place]) 04/30/2020   CVA (cerebral vascular accident) ([place]) 04/21/2020   Unspecified atrial fibrillation ([place]) 04/21/2020   Essential hypertension 04/21/2020   Alcohol abuse 04/21/2020    Lanice Shirts, PT 07/30/2021, 8:58 AM  [facility] [address] [place], [place], [address] Phone: [phone]   Fax:  [phone]  Name: Lindsey [name] MRN: [id number] Date of Birth: 12-14-1955

## 2021-08-03 ENCOUNTER — Ambulatory Visit: Payer: Medicare Other | Attending: Physical Medicine and Rehabilitation

## 2021-08-03 ENCOUNTER — Other Ambulatory Visit: Payer: Self-pay

## 2021-08-03 ENCOUNTER — Ambulatory Visit: Payer: Medicare Other | Admitting: Occupational Therapy

## 2021-08-03 ENCOUNTER — Ambulatory Visit: Payer: Medicare Other

## 2021-08-03 DIAGNOSIS — R4701 Aphasia: Secondary | ICD-10-CM | POA: Insufficient documentation

## 2021-08-03 DIAGNOSIS — R482 Apraxia: Secondary | ICD-10-CM | POA: Diagnosis present

## 2021-08-03 NOTE — Therapy (Signed)
[facility] [address] [place], [place], [address] Phone: [phone]   Fax:  [phone]  Speech Language Pathology Treatment/Recert  Patient Details  Name: Lindsey [name] MRN: [id number] Date of Birth: 24-Oct-1955 Referring Provider (SLP): Ranell Patrick Clide Deutscher, MD   Encounter Date: 08/03/2021   End of Session - 08/03/21 1552     Visit Number 19    Number of Visits 25    Date for SLP Re-Evaluation 09/17/21    Authorization Type Medicare    SLP Start Time 1450    SLP Stop Time  1530    SLP Time Calculation (min) 40 min    Activity Tolerance Patient tolerated treatment well             Past Medical History:  Diagnosis Date   Diverticulosis    Hypertension    Uterine prolapse     History reviewed. No pertinent surgical history.  There were no vitals filed for this visit.   Subjective Assessment - 08/03/21 1532     Subjective brought out permanent device    Patient is accompained by: Family member    Currently in Pain? No/denies                   ADULT SLP TREATMENT - 08/03/21 1532       General Information   Behavior/Cognition Alert;Cooperative;Pleasant mood      Treatment Provided   Treatment provided Cognitive-Linquistic      Cognitive-Linquistic Treatment   Treatment focused on Aphasia;Apraxia;Patient/family/caregiver education    Skilled Treatment Pt returned with permanent Lingraphica device. SLP provided re-orientation to device and how to modify. Pt demonstrated good comprehension of how to use and navigate with repetition and intermittent min to mod A. POC updated to include additional training visits per family request to ensure good carryover and comprehension of personal device at home.      Assessment / Recommendations / Plan   Plan Continue with current plan of care;Goals updated      Progression Toward Goals   Progression toward goals Progressing toward goals               SLP Education - 08/03/21 1552     Education Details review of [place] education and training    Person(s) Educated Patient;Child(ren)    Methods Explanation;Demonstration;Verbal cues    Comprehension Verbalized understanding;Returned demonstration;Verbal cues required;Need further instruction              SLP Short Term Goals - 05/25/21 1522       SLP SHORT TERM GOAL #1   Title Pt will ID paraphasias/perseverations and attempt to correct errors with pause and retrial for 4/5 opportunities given occasional mod A over 2 sessions    Baseline 05-04-21, 05-20-21    Status Achieved      SLP SHORT TERM GOAL #2   Title Given verbal or visual prompt, pt will correctly ID written word/object/icon in field of 4-6 to communicate wants/needs with 75% accuracy given occasional mod A over 2 sessions    Baseline 05-04-21, 05-18-21    Status Achieved      SLP SHORT TERM GOAL #3   Title Pt will use multimodal/AAC to ID biographical information and personal interests given 2-6 choices with occasional mod A over 2 sessions    Baseline 05-18-21, 05-25-21    Status Achieved      SLP SHORT TERM GOAL #4   Title Pt will verbally approximate 3 significant words for 5/10  trials given occasional fading to rare mod A over 2 sessions    Baseline 05-20-21, 05-25-21    Status Achieved              SLP Long Term Goals - 08/03/21 1554       SLP LONG TERM GOAL #1   Title Pt will spontaneously use multimodal communication system for 4/5 opportunities to increase ability to express wants/needs given >2 choices over 2 sessions    Baseline 06-17-21, 07-06-21    Status Achieved      SLP LONG TERM GOAL #2   Title Pt will ID 80% of paraphasias/perseverations in a session and attempt to self-correct given occasional min A over 2 sessions    Baseline 06-17-21, 07-06-21    Status Achieved      SLP LONG TERM GOAL #3   Title Caregiver will appropriate cue patient when apraxic/aphasic errors occurs for 4/5  opportunities over 2 sessions    Status Partially Met      SLP LONG TERM GOAL #4   Title Pt/daughter will report improved communication effectiveness via PROM by 2 points by last ST session    Time 6    Period Weeks    Status On-going   ongoing     SLP LONG TERM GOAL #5   Title Pt/caregiver will demonstate how to use and modify Lingraphica device with rare min A over 4 sessions    Baseline 08-03-21    Time 6    Period Weeks    Status On-going   ongoing             Plan - 08/03/21 1553     Clinical Impression Statement Andrianna presents for OPST intervention to address 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 Pt received permanent Lingraphica device. SLP provided further education and training of how to use and modify device for increased communciation. POC extended to include additional training for patient/caregiver education and training with new personal device Skilled ST is recommended to address aphasia and apraxia impacting pt's abilty to communicate effectively with both familiar and unfamiliar listeners to convey wants/needs, safety concerns, and participate in social situations.    Speech Therapy Frequency 1x /week   every other week per daughter request   Duration Other (comment)   6 weeks   Treatment/Interventions Compensatory strategies;Patient/family education;Functional tasks;Cueing hierarchy;Environmental controls;Cognitive reorganization;Multimodal communcation approach;Language facilitation;Compensatory techniques;Internal/external aids;SLP instruction and feedback;Other (comment)    Potential to Achieve Goals Fair    Potential Considerations Severity of impairments;Previous level of function    SLP Home Exercise Plan provided    Consulted and Agree with Plan of Care Patient;Family member/caregiver    Family Member Consulted daughter             Patient will benefit from skilled therapeutic intervention in order to improve the following deficits and  impairments:   Aphasia  Verbal apraxia    Problem List Patient Active Problem List   Diagnosis Date Noted   Pain due to onychomycosis of toenails of both feet 11/19/2020   Anemia 05/31/2020   Acute ischemic left MCA stroke ([place]) 04/30/2020   Right hemiplegia ([place]) 04/30/2020   CVA (cerebral vascular accident) ([place]) 04/21/2020   Unspecified atrial fibrillation ([place]) 04/21/2020   Essential hypertension 04/21/2020   Alcohol abuse 04/21/2020    Alinda Deem, MA CCC-SLP 08/03/2021, 4:05 PM  [facility] [address] [place], [place], [address] Phone: [phone]   Fax:  [phone]  Name: Brylie Sneath MRN: [id number] Date of Birth: 1956/01/15

## 2021-08-17 ENCOUNTER — Ambulatory Visit: Payer: Medicare Other

## 2021-08-23 ENCOUNTER — Ambulatory Visit (INDEPENDENT_AMBULATORY_CARE_PROVIDER_SITE_OTHER): Payer: Medicare Other | Admitting: Podiatry

## 2021-08-23 ENCOUNTER — Encounter: Payer: Self-pay | Admitting: Podiatry

## 2021-08-23 ENCOUNTER — Other Ambulatory Visit: Payer: Self-pay

## 2021-08-23 DIAGNOSIS — B351 Tinea unguium: Secondary | ICD-10-CM

## 2021-08-23 DIAGNOSIS — M79675 Pain in left toe(s): Secondary | ICD-10-CM

## 2021-08-23 DIAGNOSIS — M79674 Pain in right toe(s): Secondary | ICD-10-CM

## 2021-08-23 NOTE — Progress Notes (Signed)
This patient returns to my office for at risk foot care.  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  She presents to the office with her daughter.This patient is unable to cut nails herself since the patient cannot reach her nails.These nails are painful walking and wearing shoes.  This patient presents for at risk foot care today.  General Appearance  Alert, conversant and in no acute stress.  Vascular  Dorsalis pedis and posterior tibial  pulses are palpable  Left.  Absent dorsalis pedis and posterior tibial pulses rightfoot..  Capillary return is within normal limits  bilaterally. Temperature is within normal limits left foot.  Cold and swollen foot right..  Neurologic  Senn-Weinstein monofilament wire test within normal limits  bilaterally. Muscle power within normal limits left.  Absent ROM and muscle strength right..  Nails Thick disfigured discolored nails with subungual debris  from hallux to fifth toes bilaterally. No evidence of bacterial infection or drainage bilaterally.  Orthopedic  No limitations of motion  feet .  No crepitus or effusions noted.  No bony pathology or digital deformities noted.  Skin  normotropic skin with no porokeratosis noted bilaterally.  No signs of infections or ulcers noted.     Onychomycosis  Pain in right toes  Pain in left toes  Consent was obtained for treatment procedures.   Mechanical debridement of nails 1-5  bilaterally performed with a nail nipper.  Filed with dremel without incident.    Return office visit  3 months                    Told patient to return for periodic foot care and evaluation due to potential at risk complications.   Helane Gunther DPM

## 2021-09-01 ENCOUNTER — Other Ambulatory Visit: Payer: Self-pay

## 2021-09-01 ENCOUNTER — Ambulatory Visit: Payer: Medicare Other

## 2021-09-01 DIAGNOSIS — R4701 Aphasia: Secondary | ICD-10-CM

## 2021-09-01 DIAGNOSIS — R482 Apraxia: Secondary | ICD-10-CM

## 2021-09-01 NOTE — Therapy (Signed)
[facility] [address] [place], [place], [address] Phone: [phone]   Fax:  [phone]  Speech Language Pathology Treatment/Progress Note/Discharge  Patient Details  Name: Lindsey [name] MRN: [id number] Date of Birth: [birth year] Referring Provider (SLP): Ranell Patrick Clide Deutscher, MD   Encounter Date: 09/01/2021   End of Session - 09/01/21 1657     Visit Number 20    Number of Visits 25    Date for SLP Re-Evaluation 09/17/21    Authorization Type Medicare    SLP Start Time 1620    SLP Stop Time  1655    SLP Time Calculation (min) 35 min    Activity Tolerance Patient tolerated treatment well             Past Medical History:  Diagnosis Date   Diverticulosis    Hypertension    Uterine prolapse     History reviewed. No pertinent surgical history.  There were no vitals filed for this visit.   Subjective Assessment - 09/01/21 1655     Subjective "good"    Patient is accompained by: Family member    Currently in Pain? No/denies            Speech Therapy Progress Note  Dates of Reporting Period: 04-01-21 to current  Objective Reports of Subjective Statement: Pt seen for 20 ST visits targeting aphasia and apraxia. Pt now has permanent Lingraphica communication device and using with success.   Objective Measurements: Pt now effectively communicating via Lingraphica device.   Goal Update: see below  Plan: see below  Reason Skilled Services are Required: n/a discharge today  SPEECH THERAPY DISCHARGE SUMMARY  Visits from Start of Care: 20  Current functional level related to goals / functional outcomes: Lindsey [name] presents with improved expressive and receptive language skills and is now communicating more effectively via her permanent Lingraphica device.   Remaining deficits: Aphasia and apraxia   Education / Equipment: Lingraphica trials, caregiver education, modification and implementation of  high tech communication device   Patient agrees to discharge. Patient goals were partially met. Patient is being discharged due to being pleased with the current functional level..           ADULT SLP TREATMENT - 09/01/21 1624       General Information   Behavior/Cognition Alert;Cooperative;Pleasant mood      Treatment Provided   Treatment provided Cognitive-Linquistic      Cognitive-Linquistic Treatment   Treatment focused on Aphasia;Apraxia;Patient/family/caregiver education    Skilled Treatment Pt returned with permanent Lingraphica device. Good usage and carryover reported at home. SLP assisted with modification of personal holiday category. Pt able to identify correct icon for novel category with 80% accuracy. Pt's daughter reports she is pleased with current progress and feels comfortable modifying and implementing device with patient. ST discharge requested today.      Assessment / Recommendations / Plan   Plan Discharge SLP treatment due to (comment)   pleased with current progress     Progression Toward Goals   Progression toward goals Goals partially met, education completed, patient discharged from [place] Education - 09/01/21 1657     Education Details discharge summary, how to modify device    Person(s) Educated Patient;Child(ren)    Methods Explanation;Demonstration    Comprehension Verbalized understanding;Returned demonstration              SLP Short Term Goals - 05/25/21 5732  SLP SHORT TERM GOAL #1   Title Pt will ID paraphasias/perseverations and attempt to correct errors with pause and retrial for 4/5 opportunities given occasional mod A over 2 sessions    Baseline 05-04-21, 05-20-21    Status Achieved      SLP SHORT TERM GOAL #2   Title Given verbal or visual prompt, pt will correctly ID written word/object/icon in field of 4-6 to communicate wants/needs with 75% accuracy given occasional mod A over 2 sessions    Baseline  05-04-21, 05-18-21    Status Achieved      SLP SHORT TERM GOAL #3   Title Pt will use multimodal/AAC to ID biographical information and personal interests given 2-6 choices with occasional mod A over 2 sessions    Baseline 05-18-21, 05-25-21    Status Achieved      SLP SHORT TERM GOAL #4   Title Pt will verbally approximate 3 significant words for 5/10 trials given occasional fading to rare mod A over 2 sessions    Baseline 05-20-21, 05-25-21    Status Achieved              SLP Long Term Goals - 09/01/21 1658       SLP LONG TERM GOAL #1   Title Pt will spontaneously use multimodal communication system for 4/5 opportunities to increase ability to express wants/needs given >2 choices over 2 sessions    Baseline 06-17-21, 07-06-21    Status Achieved      SLP LONG TERM GOAL #2   Title Pt will ID 80% of paraphasias/perseverations in a session and attempt to self-correct given occasional min A over 2 sessions    Baseline 06-17-21, 07-06-21    Status Achieved      SLP LONG TERM GOAL #3   Title Caregiver will appropriate cue patient when apraxic/aphasic errors occurs for 4/5 opportunities over 2 sessions    Status Partially Met      SLP LONG TERM GOAL #4   Title Pt/daughter will report improved communication effectiveness via PROM by 2 points by last ST session    Status Deferred        SLP LONG TERM GOAL #5   Title Pt/caregiver will demonstate how to use and modify Lingraphica device with rare min A over 4 sessions    Baseline 08-03-21, 09-01-21    Status Partially Met                Plan - 09/01/21 1658     Clinical Impression Statement Lindsey [name] presents for OPST intervention to address 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 Pt received permanent Lingraphica device. SLP provided further education and training of how to use and modify device for increased communciation. Pt and family pleased with current progress and requested ST discharge this date.    Speech Therapy  Frequency 1x /week   every other week per daughter request   Duration Other (comment)   6 weeks   Treatment/Interventions Compensatory strategies;Patient/family education;Functional tasks;Cueing hierarchy;Environmental controls;Cognitive reorganization;Multimodal communcation approach;Language facilitation;Compensatory techniques;Internal/external aids;SLP instruction and feedback;Other (comment)    Potential to Achieve Goals Fair    Potential Considerations Severity of impairments;Previous level of function    SLP Home Exercise Plan provided    Consulted and Agree with Plan of Care Patient;Family member/caregiver    Family Member Consulted daughter             Patient will benefit from skilled therapeutic intervention in order to improve the following deficits and impairments:  Aphasia  Verbal apraxia    Problem List Patient Active Problem List   Diagnosis Date Noted   Pain due to onychomycosis of toenails of both feet 11/19/2020   Anemia 05/31/2020   Acute ischemic left MCA stroke ([place]) 04/30/2020   Right hemiplegia ([place]) 04/30/2020   CVA (cerebral vascular accident) ([place]) 04/21/2020   Unspecified atrial fibrillation ([place]) 04/21/2020   Essential hypertension 04/21/2020   Alcohol abuse 04/21/2020    Alinda Deem, [place] 09/01/2021, 5:00 PM  [facility] [address] [place], [place], [address] Phone: [phone]   Fax:  [phone]   Name: Lindsey [name] MRN: [id number] Date of Birth: 02-17-1956

## 2021-09-09 ENCOUNTER — Ambulatory Visit: Payer: Medicaid Other | Admitting: Adult Health

## 2021-09-19 ENCOUNTER — Encounter (HOSPITAL_COMMUNITY): Payer: Self-pay

## 2021-09-19 ENCOUNTER — Emergency Department (HOSPITAL_COMMUNITY): Payer: Medicare Other

## 2021-09-19 ENCOUNTER — Other Ambulatory Visit: Payer: Self-pay

## 2021-09-19 ENCOUNTER — Inpatient Hospital Stay (HOSPITAL_COMMUNITY)
Admission: EM | Admit: 2021-09-19 | Discharge: 2021-09-24 | DRG: 689 | Disposition: A | Discharge status: Rehabilitation Facility | Payer: Medicare Other | Attending: Internal Medicine | Admitting: Internal Medicine

## 2021-09-19 ENCOUNTER — Inpatient Hospital Stay (HOSPITAL_COMMUNITY): Payer: Medicare Other

## 2021-09-19 DIAGNOSIS — R29714 NIHSS score 14: Secondary | ICD-10-CM | POA: Diagnosis not present

## 2021-09-19 DIAGNOSIS — R471 Dysarthria and anarthria: Secondary | ICD-10-CM | POA: Diagnosis not present

## 2021-09-19 DIAGNOSIS — N1370 Vesicoureteral-reflux, unspecified: Secondary | ICD-10-CM | POA: Diagnosis present

## 2021-09-19 DIAGNOSIS — I4891 Unspecified atrial fibrillation: Secondary | ICD-10-CM | POA: Diagnosis not present

## 2021-09-19 DIAGNOSIS — N10 Acute tubulo-interstitial nephritis: Secondary | ICD-10-CM | POA: Diagnosis not present

## 2021-09-19 DIAGNOSIS — N151 Renal and perinephric abscess: Secondary | ICD-10-CM | POA: Diagnosis present

## 2021-09-19 DIAGNOSIS — Z7901 Long term (current) use of anticoagulants: Secondary | ICD-10-CM

## 2021-09-19 DIAGNOSIS — E785 Hyperlipidemia, unspecified: Secondary | ICD-10-CM | POA: Diagnosis present

## 2021-09-19 DIAGNOSIS — I482 Chronic atrial fibrillation, unspecified: Secondary | ICD-10-CM | POA: Diagnosis not present

## 2021-09-19 DIAGNOSIS — E876 Hypokalemia: Secondary | ICD-10-CM | POA: Diagnosis present

## 2021-09-19 DIAGNOSIS — R778 Other specified abnormalities of plasma proteins: Secondary | ICD-10-CM | POA: Diagnosis present

## 2021-09-19 DIAGNOSIS — I4819 Other persistent atrial fibrillation: Secondary | ICD-10-CM | POA: Diagnosis present

## 2021-09-19 DIAGNOSIS — N12 Tubulo-interstitial nephritis, not specified as acute or chronic: Secondary | ICD-10-CM | POA: Diagnosis not present

## 2021-09-19 DIAGNOSIS — N119 Chronic tubulo-interstitial nephritis, unspecified: Secondary | ICD-10-CM | POA: Diagnosis present

## 2021-09-19 DIAGNOSIS — B961 Klebsiella pneumoniae [K. pneumoniae] as the cause of diseases classified elsewhere: Secondary | ICD-10-CM | POA: Diagnosis present

## 2021-09-19 DIAGNOSIS — R29810 Facial weakness: Secondary | ICD-10-CM | POA: Diagnosis not present

## 2021-09-19 DIAGNOSIS — I639 Cerebral infarction, unspecified: Secondary | ICD-10-CM | POA: Diagnosis not present

## 2021-09-19 DIAGNOSIS — Z20822 Contact with and (suspected) exposure to covid-19: Secondary | ICD-10-CM | POA: Diagnosis present

## 2021-09-19 DIAGNOSIS — K3184 Gastroparesis: Secondary | ICD-10-CM | POA: Diagnosis not present

## 2021-09-19 DIAGNOSIS — R4182 Altered mental status, unspecified: Secondary | ICD-10-CM | POA: Diagnosis present

## 2021-09-19 DIAGNOSIS — I63542 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: Secondary | ICD-10-CM | POA: Diagnosis not present

## 2021-09-19 DIAGNOSIS — R935 Abnormal findings on diagnostic imaging of other abdominal regions, including retroperitoneum: Secondary | ICD-10-CM

## 2021-09-19 DIAGNOSIS — I10 Essential (primary) hypertension: Secondary | ICD-10-CM | POA: Diagnosis present

## 2021-09-19 DIAGNOSIS — R109 Unspecified abdominal pain: Secondary | ICD-10-CM

## 2021-09-19 DIAGNOSIS — A499 Bacterial infection, unspecified: Secondary | ICD-10-CM | POA: Diagnosis not present

## 2021-09-19 DIAGNOSIS — Z79899 Other long term (current) drug therapy: Secondary | ICD-10-CM

## 2021-09-19 DIAGNOSIS — R569 Unspecified convulsions: Secondary | ICD-10-CM | POA: Diagnosis present

## 2021-09-19 DIAGNOSIS — N390 Urinary tract infection, site not specified: Secondary | ICD-10-CM | POA: Diagnosis not present

## 2021-09-19 DIAGNOSIS — D649 Anemia, unspecified: Secondary | ICD-10-CM | POA: Diagnosis present

## 2021-09-19 DIAGNOSIS — G9389 Other specified disorders of brain: Secondary | ICD-10-CM | POA: Diagnosis not present

## 2021-09-19 DIAGNOSIS — A419 Sepsis, unspecified organism: Secondary | ICD-10-CM | POA: Diagnosis not present

## 2021-09-19 DIAGNOSIS — I69320 Aphasia following cerebral infarction: Secondary | ICD-10-CM

## 2021-09-19 DIAGNOSIS — Z1611 Resistance to penicillins: Secondary | ICD-10-CM | POA: Diagnosis present

## 2021-09-19 DIAGNOSIS — B9689 Other specified bacterial agents as the cause of diseases classified elsewhere: Secondary | ICD-10-CM | POA: Diagnosis not present

## 2021-09-19 DIAGNOSIS — M79605 Pain in left leg: Secondary | ICD-10-CM | POA: Diagnosis not present

## 2021-09-19 DIAGNOSIS — Z7982 Long term (current) use of aspirin: Secondary | ICD-10-CM

## 2021-09-19 DIAGNOSIS — I6340 Cerebral infarction due to embolism of unspecified cerebral artery: Secondary | ICD-10-CM | POA: Insufficient documentation

## 2021-09-19 DIAGNOSIS — I6389 Other cerebral infarction: Secondary | ICD-10-CM | POA: Diagnosis not present

## 2021-09-19 DIAGNOSIS — N179 Acute kidney failure, unspecified: Secondary | ICD-10-CM | POA: Diagnosis not present

## 2021-09-19 DIAGNOSIS — R1901 Right upper quadrant abdominal swelling, mass and lump: Secondary | ICD-10-CM

## 2021-09-19 DIAGNOSIS — I69351 Hemiplegia and hemiparesis following cerebral infarction affecting right dominant side: Secondary | ICD-10-CM

## 2021-09-19 DIAGNOSIS — N280 Ischemia and infarction of kidney: Secondary | ICD-10-CM | POA: Diagnosis not present

## 2021-09-19 DIAGNOSIS — K5900 Constipation, unspecified: Secondary | ICD-10-CM | POA: Diagnosis not present

## 2021-09-19 LAB — COMPREHENSIVE METABOLIC PANEL
ALT: 30 U/L (ref 0–44)
AST: 30 U/L (ref 15–41)
Albumin: 3.2 g/dL — ABNORMAL LOW (ref 3.5–5.0)
Alkaline Phosphatase: 75 U/L (ref 38–126)
Anion gap: 14 (ref 5–15)
BUN: <5 mg/dL — ABNORMAL LOW (ref 8–23)
CO2: 21 mmol/L — ABNORMAL LOW (ref 22–32)
Calcium: 9.1 mg/dL (ref 8.9–10.3)
Chloride: 105 mmol/L (ref 98–111)
Creatinine, Ser: 0.63 mg/dL (ref 0.44–1.00)
GFR, Estimated: >60 mL/min (ref >60)
Glucose, Bld: 181 mg/dL — ABNORMAL HIGH (ref 70–99)
Potassium: 3 mmol/L — ABNORMAL LOW (ref 3.5–5.1)
Sodium: 140 mmol/L (ref 135–145)
Total Bilirubin: 0.8 mg/dL (ref 0.3–1.2)
Total Protein: 8.2 g/dL — ABNORMAL HIGH (ref 6.5–8.1)

## 2021-09-19 LAB — CBC
HCT: 31.9 % — ABNORMAL LOW (ref 36.0–46.0)
Hemoglobin: 10.5 g/dL — ABNORMAL LOW (ref 12.0–15.0)
MCH: 29.2 pg (ref 26.0–34.0)
MCHC: 32.9 g/dL (ref 30.0–36.0)
MCV: 88.6 fL (ref 80.0–100.0)
Platelets: 386 10*3/uL (ref 150–400)
RBC: 3.6 MIL/uL — ABNORMAL LOW (ref 3.87–5.11)
RDW: 15.1 % (ref 11.5–15.5)
WBC: 6.8 10*3/uL (ref 4.0–10.5)
nRBC: 0 % (ref 0.0–0.2)

## 2021-09-19 LAB — URINALYSIS, ROUTINE W REFLEX MICROSCOPIC
Bilirubin Urine: NEGATIVE
Glucose, UA: NEGATIVE mg/dL
Ketones, ur: NEGATIVE mg/dL
Leukocytes,Ua: NEGATIVE
Nitrite: NEGATIVE
Protein, ur: 30 mg/dL — AB
Specific Gravity, Urine: 1.01 (ref 1.005–1.030)
pH: 6 (ref 5.0–8.0)

## 2021-09-19 LAB — LIPASE, BLOOD: Lipase: 27 U/L (ref 11–51)

## 2021-09-19 LAB — RESP PANEL BY RT-PCR (FLU A&B, COVID) ARPGX2
Influenza A by PCR: NEGATIVE
Influenza B by PCR: NEGATIVE
SARS Coronavirus 2 by RT PCR: NEGATIVE

## 2021-09-19 LAB — TROPONIN I (HIGH SENSITIVITY)
Troponin I (High Sensitivity): 33 ng/L — ABNORMAL HIGH (ref <18)
Troponin I (High Sensitivity): 42 ng/L — ABNORMAL HIGH (ref <18)
Troponin I (High Sensitivity): 34 ng/L — ABNORMAL HIGH (ref <18)

## 2021-09-19 LAB — URINALYSIS, MICROSCOPIC (REFLEX)
Bacteria, UA: NONE SEEN
RBC / HPF: NONE SEEN RBC/hpf (ref 0–5)

## 2021-09-19 LAB — CBG MONITORING, ED: Glucose-Capillary: 142 mg/dL — ABNORMAL HIGH (ref 70–99)

## 2021-09-19 IMAGING — CT CT ABD-PELV W/ CM
2 of 7 series · 14 of 46 positions shown, 16 images · IV contrast (APPLIED)
Comparison: CT [DATE]

CLINICAL DATA: Nausea and vomiting.

EXAM:
CT ABDOMEN AND PELVIS WITH CONTRAST
TECHNIQUE: Multidetector CT imaging of the abdomen and pelvis was performed
using the standard protocol following bolus administration of
intravenous contrast.
CONTRAST:  100mL OMNIPAQUE IOHEXOL 300 MG/ML  SOLN

[Series 3: abdomen 5.0 · axial · 0.60mm/px · z∈[-271,+104]mm · 11 of 91 slices shown, 13 images]
[im 8/91  soft-tissue]
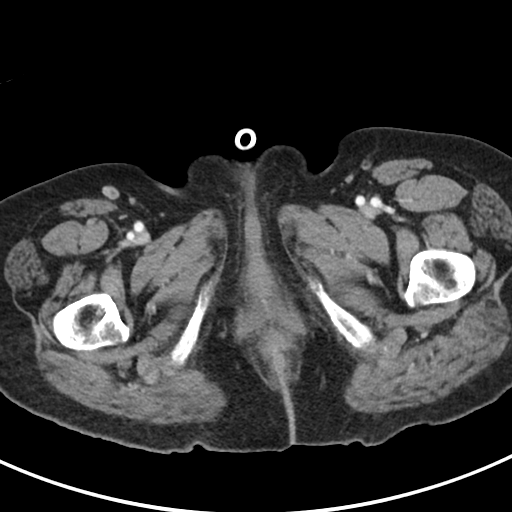
[im 8/91  bone]
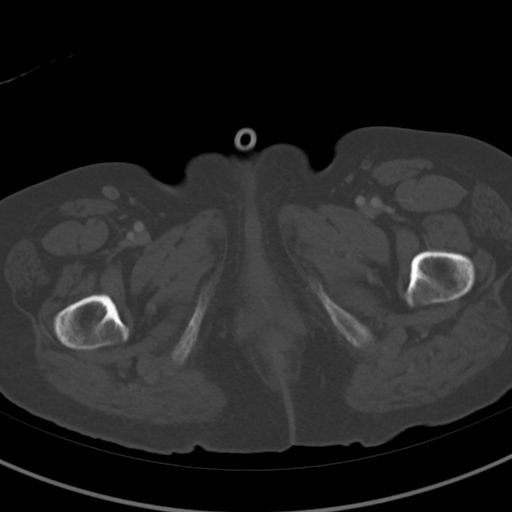
[im 16/91  soft-tissue]
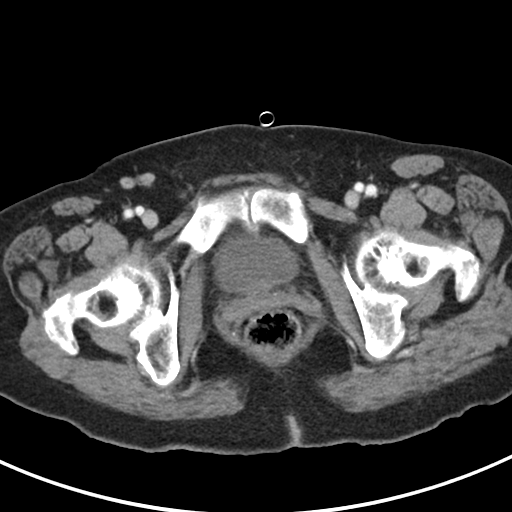
[im 23/91  soft-tissue]
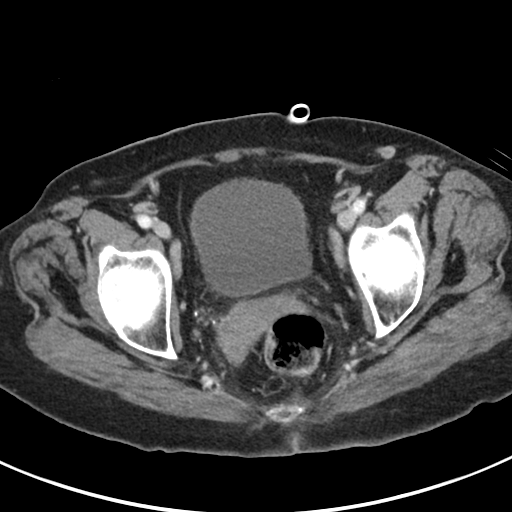
[im 31/91  soft-tissue]
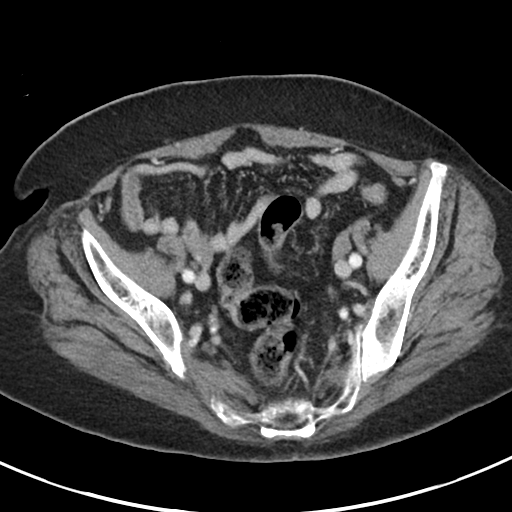
[im 38/91  soft-tissue]
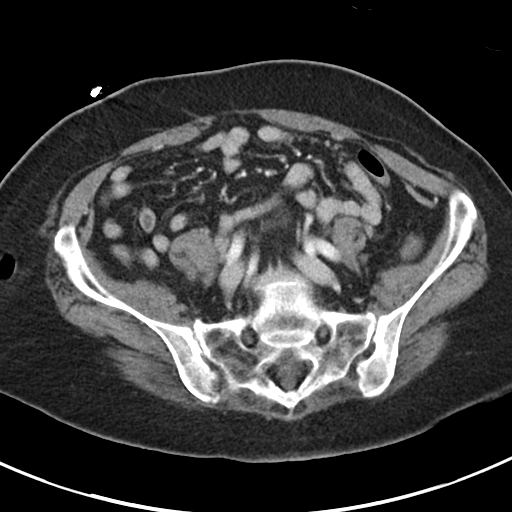
[im 46/91  soft-tissue]
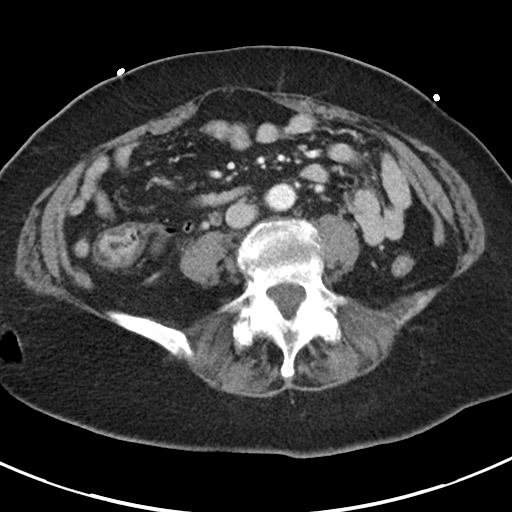
[im 53/91  soft-tissue]
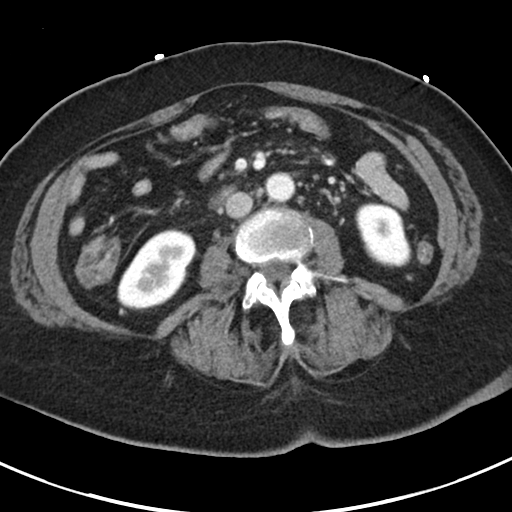
[im 61/91  soft-tissue]
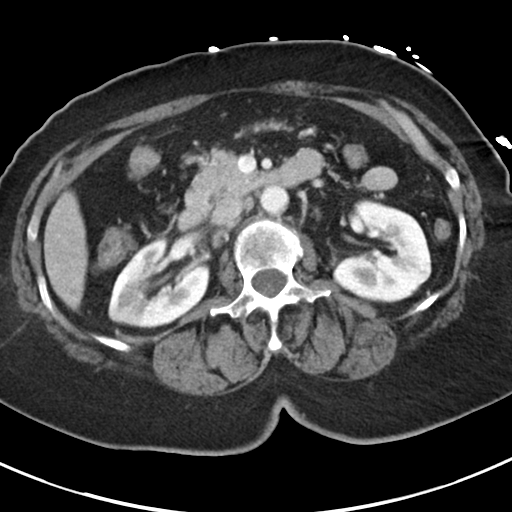
[im 68/91  soft-tissue]
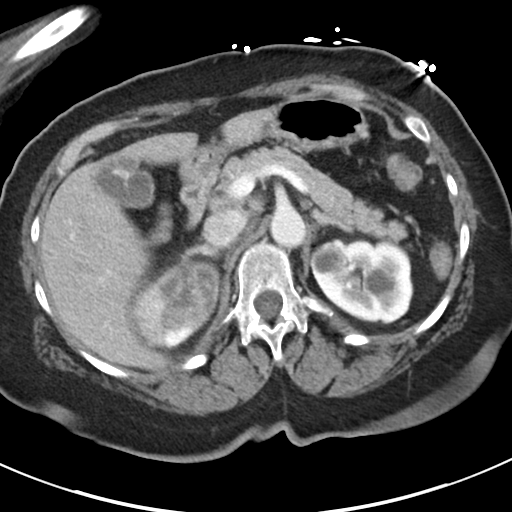
[im 68/91  bone]
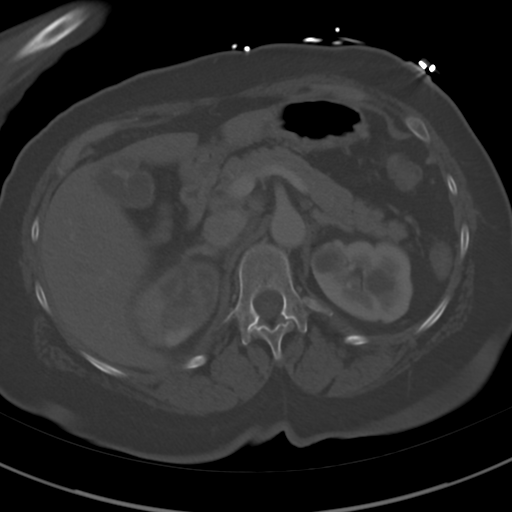
[im 76/91  soft-tissue]
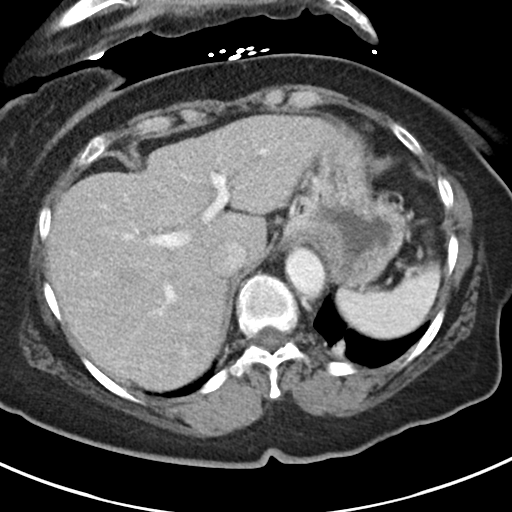
[im 83/91  soft-tissue]
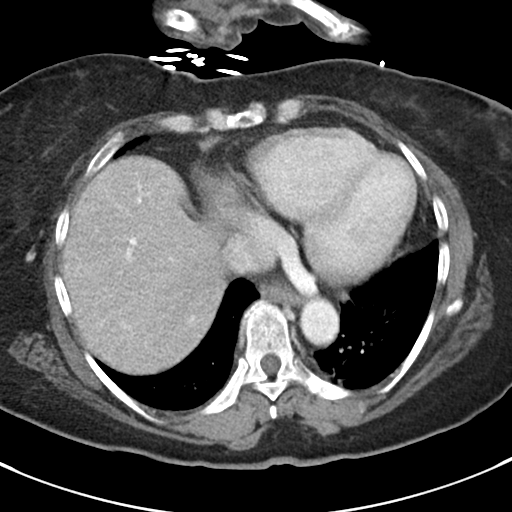

[Series 6: abdomen 3.0 mpr cor · coronal · 0.64mm/px · 3 of 88 slices shown]
[im 30/88  soft-tissue]
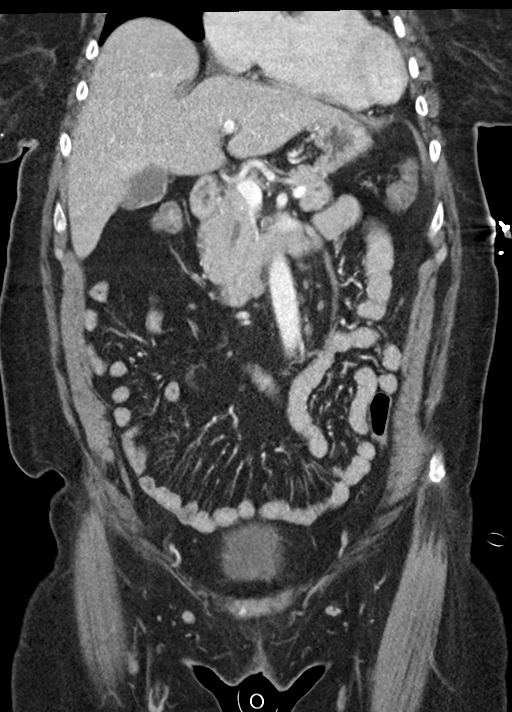
[im 39/88  soft-tissue]
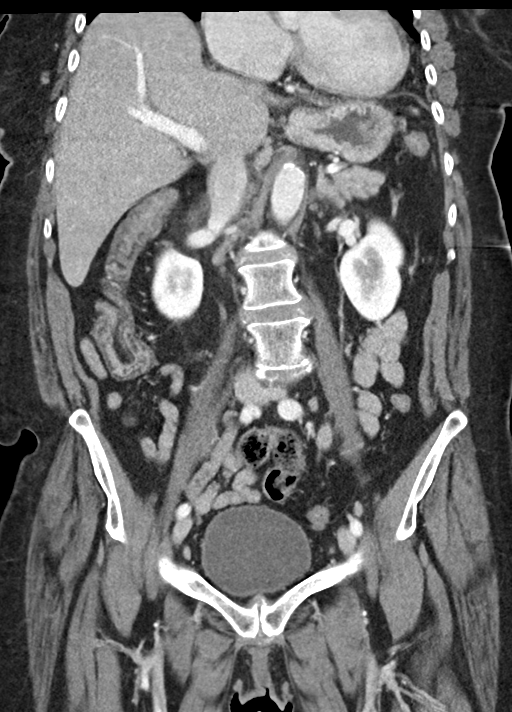
[im 49/88  soft-tissue]
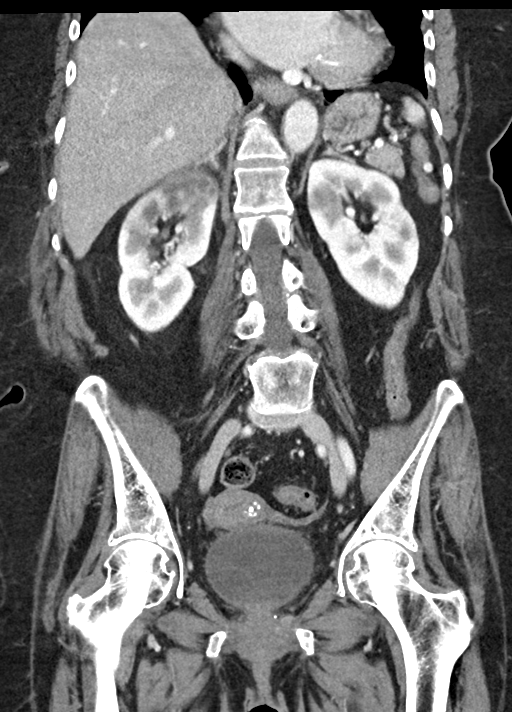

[14 of 46 positions shown; findings below may reference images not displayed]

FINDINGS: Lower chest: No acute abnormality. Subsegmental atelectasis at the
lung bases. Biatrial cardiac enlargement.

Hepatobiliary: No focal liver abnormality is seen. A 0.9 cm
hyperdensity is seen within the gallbladder with C-shaped
configuration of the gallbladder body (series 3, images 24-25). No
gallbladder wall thickening or pericholecystic fluid. No bile duct
dilatation.

Pancreas: Mild dilatation of the main pancreatic duct within the
pancreatic head measuring up to 0.5 cm in diameter (series 3, image
29; series 6, image 30). The remainder of the pancreatic duct is
normal caliber. No peripancreatic fat stranding or fluid collection.

Spleen: Normal in size without focal abnormality.

Adrenals/Urinary Tract: The adrenal glands are unremarkable. Focal
hypoenhancement at the upper pole of the right kidney (series 3,
image 24) as well as at the posterior and lateral aspects of the
middle third of the right kidney (series 3, image 29, 33). There is
subtle fat stranding at the right hilum. Cortical scarring noted at
the upper pole of the left kidney. A few simple cortical and
parapelvic cysts are noted in the left kidney. No hydronephrosis or
nephrolithiasis. Urinary bladder is unremarkable.

Stomach/Bowel: Stomach is within normal limits. Appendix appears
normal. Scattered diverticula of the transverse and descending
colon. No evidence of bowel wall thickening, distention, or
inflammatory changes.

Vascular/Lymphatic: No significant vascular findings are present. An
enlarged 1.0 cm short axis superior aortocaval lymph node is noted
(series 3, image 20). No other enlarged lymph nodes in the abdomen
or pelvis.

Reproductive: Partially calcified leiomyoma noted in the left
uterine fundus. The adnexa are unremarkable.

Other: No abdominal wall hernia or abnormality. No abdominopelvic
ascites.

Musculoskeletal: No acute or significant osseous findings.
IMPRESSION: 1. Multifocal pyelonephritis in the right kidney. No perinephric
fluid collection.
2. An enlarged superior aortocaval lymph node is noted, likely
reactive. Recommend attention on follow-up imaging.
3. Mild enlargement of the main pancreatic duct within the
pancreatic head. When the patient is clinically stable and able to
follow directions and hold their breath (preferably as an
outpatient) further evaluation with dedicated abdominal MRI should
be considered.
4. A 0.9 cm hyperdensity at the gallbladder body with associated
C-shaped configuration of the gallbladder is worrisome for possible
gallbladder wall mass. This can also be further evaluated on
abdominal MRI.

## 2021-09-19 IMAGING — MR MR ABDOMEN WO/W CM MRCP
14 of 23 series · 21 of 48 positions shown · IV contrast (7mL GAD)
Comparison: CT [DATE]

CLINICAL DATA: Gallbladder mass, pyelonephritis, right upper
quadrant pain

EXAM:
MRI ABDOMEN WITHOUT AND WITH CONTRAST (INCLUDING MRCP)
TECHNIQUE: Multiplanar multisequence MR imaging of the abdomen was performed
both before and after the administration of intravenous contrast.
Heavily T2-weighted images of the biliary and pancreatic ducts were
obtained, and three-dimensional MRCP images were rendered by post
processing.
CONTRAST:  7mL GADAVIST GADOBUTROL 1 MMOL/ML IV SOLN

[Series 5: cor ssfse nav · coronal · 6.0mm · 0.78mm/px · 1 of 30 slices shown]
[im 1/30]
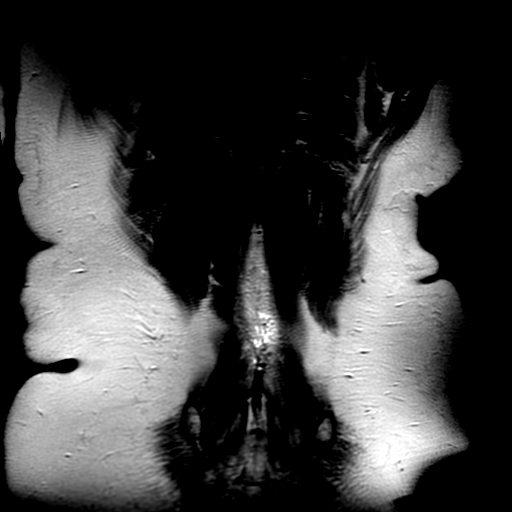

[Series 6: ax ssfse nav · axial · 6.0mm · 0.74mm/px · 1 of 32 slices shown]
[im 1/32]
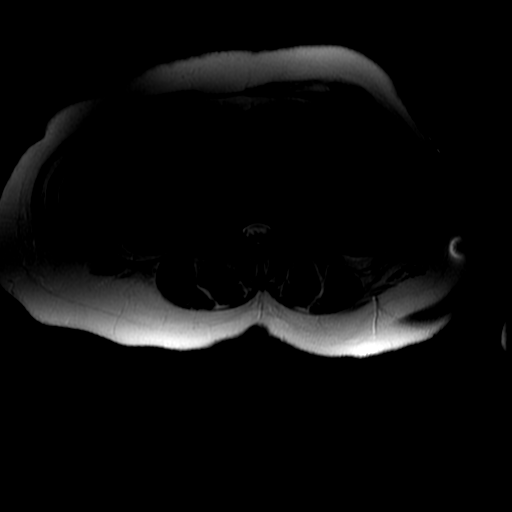

[Series 7: T2 fat-sat · axial · 6.0mm · 0.74mm/px · 1 of 32 slices shown]
[im 1/32]
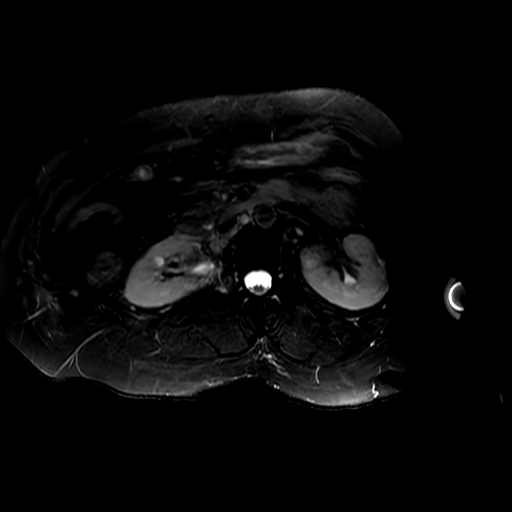

[Series 8: DWI b500 · axial · 6.0mm · 1.48mm/px · 1 of 64 slices shown]
[im 1/64]
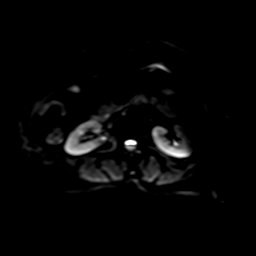

[Series 9: T1 dynamic · axial · 5.0mm · 0.74mm/px · 1 of 78 slices shown (1 of 5)]
[im 1/78]
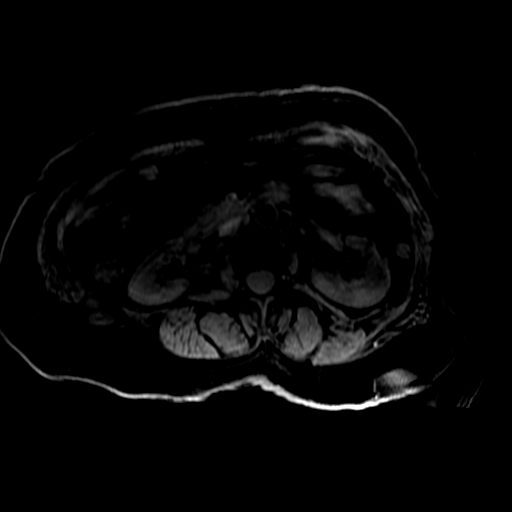

[Series 11: radial 2d thick · coronal · 40.0mm · 0.86mm/px · 1 of 6 slices shown (1 of 2)]
[im 1/6]
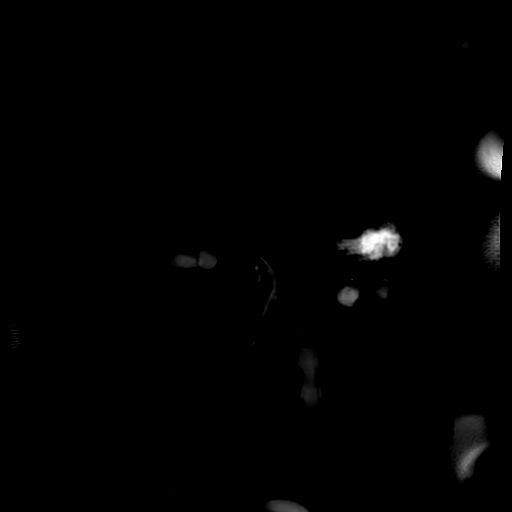

[Series 12: radial 2d thick · coronal · 40.0mm · 0.86mm/px · 1 of 6 slices shown (2 of 2)]
[im 1/6]
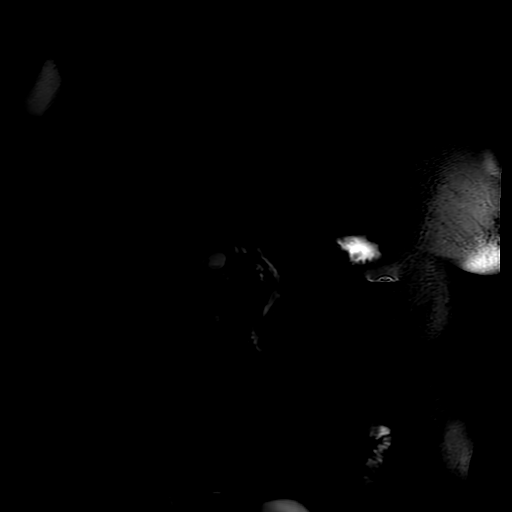

[Series 13: bSSFP · coronal · 6.0mm · 0.78mm/px · 1 of 32 slices shown]
[im 1/32]
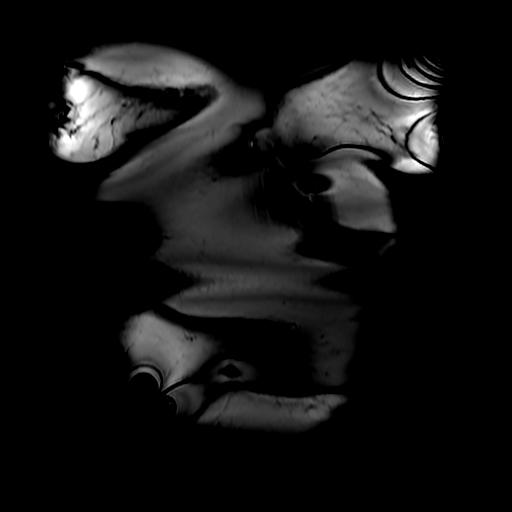

[Series 15: T1 dynamic · coronal · 3.3mm · 1.56mm/px · 3 of 114 slices shown (2 of 5)]
[im 1/114]
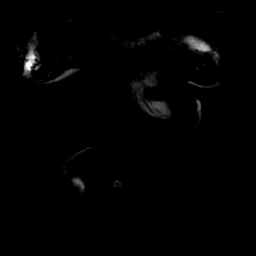
[im 57/114]
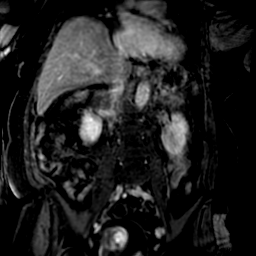
[im 114/114]
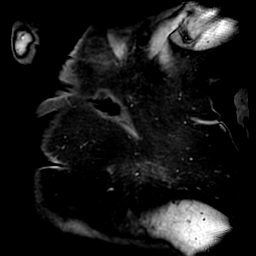

[Series 850: ADC · axial · 6.0mm · 1.48mm/px · 1 of 32 slices shown]
[im 1/32]
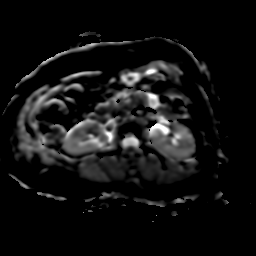

[Series 900: T1 dynamic · axial · 5.0mm · 0.74mm/px · z∈[+149,+342]mm · 2 of 78 slices shown (3 of 5)]
[im 1/78]
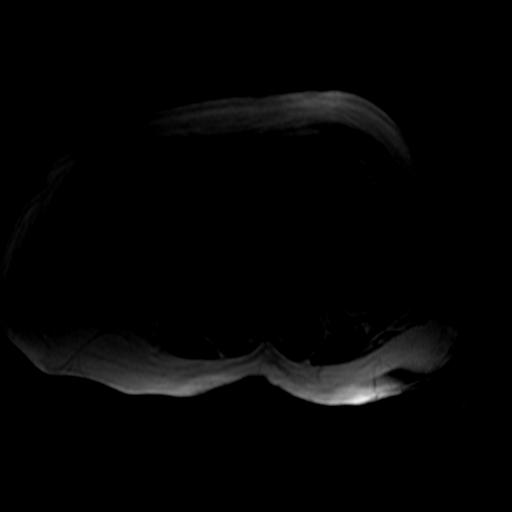
[im 78/78]
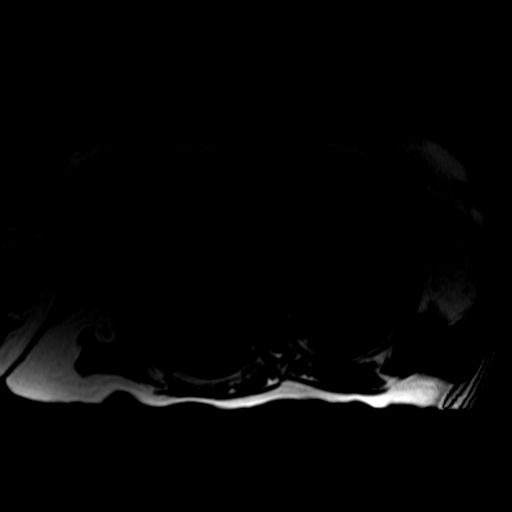

[Series 901: T1 dynamic · axial · 5.0mm · 0.74mm/px · z∈[+149,+342]mm · 2 of 78 slices shown (4 of 5)]
[im 1/78]
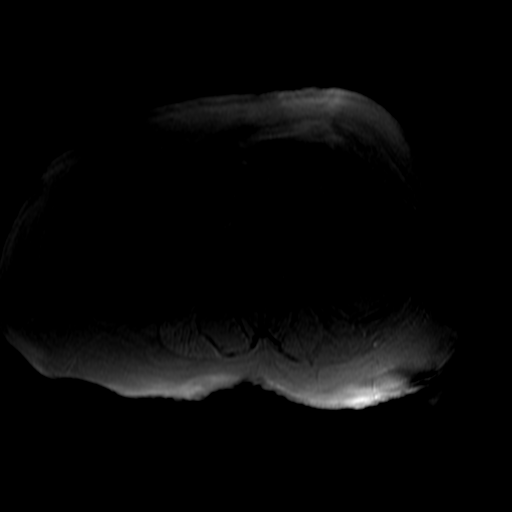
[im 78/78]
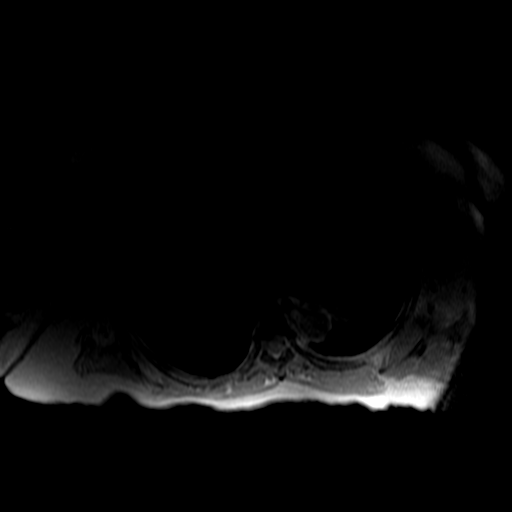

[Series 902: T1 dynamic · axial · 5.0mm · 0.74mm/px · z∈[+149,+342]mm · 2 of 78 slices shown (5 of 5)]
[im 1/78]
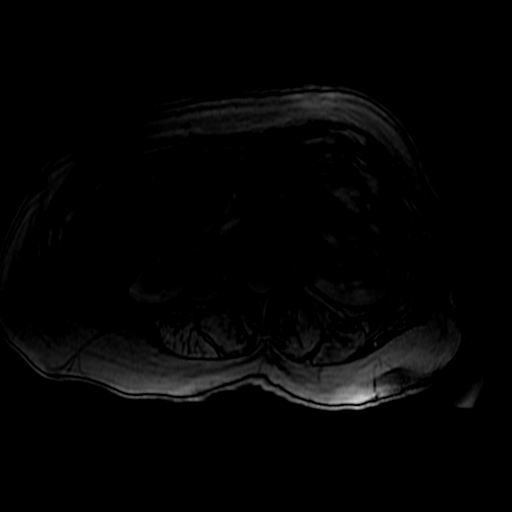
[im 78/78]
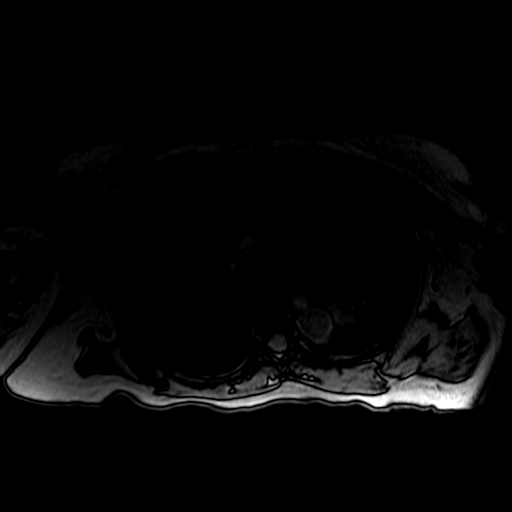

[Series 1400: T1 dynamic post-contrast · axial · non-contrast · 3.9mm · 0.86mm/px · z∈[+150,+340]mm · 3 of 96 slices shown]
[im 1/96]
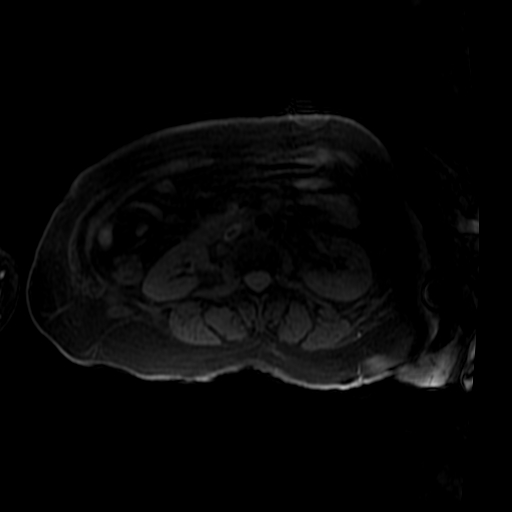
[im 48/96]
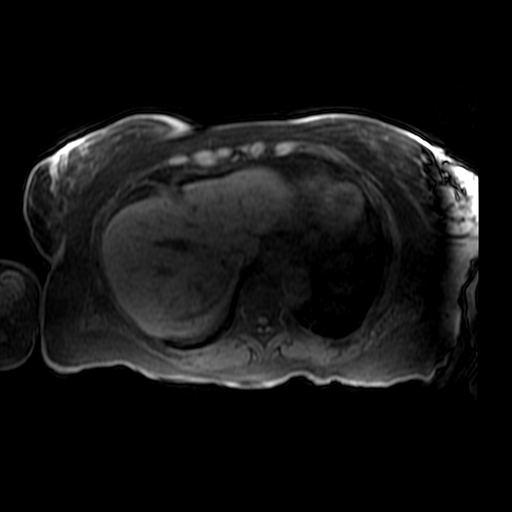
[im 96/96]
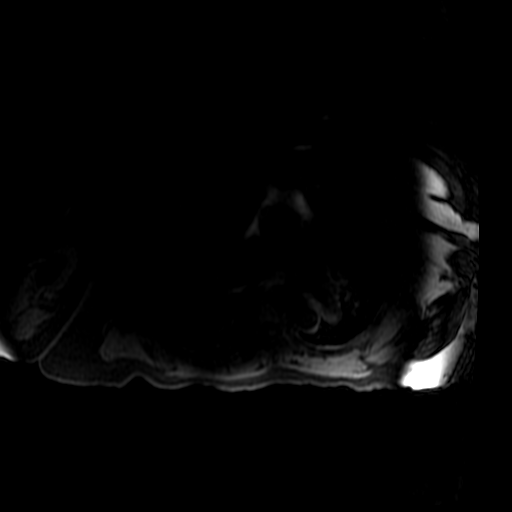

[21 of 48 positions shown; findings below may reference images not displayed]

FINDINGS: Lower chest: Mild cardiomegaly.  Small hiatal hernia.

Hepatobiliary: Hepatic parenchymal signal characteristics are within
normal limits. No intrahepatic mass identified. No intra or
extrahepatic biliary ductal dilation. No choledocholithiasis. The
gallbladder is unremarkable. Previously noted possible gallbladder
wall mass simply represents a mucosal fold secondary to a Phrygian
cap.

Pancreas:  Unremarkable.  No pancreatic divisum.

Spleen:  Unremarkable

Adrenals/Urinary Tract: The adrenal glands are unremarkable. The
kidneys are incompletely included on this examination with exclusion
of the lower poles bilaterally. Multiple simple cortical cysts are
seen within the left kidney. There are segmental regions of
decreased T2 signal intensity demonstrating relative hypoenhancement
on postcontrast images in keeping with changes of pyelonephritis
within the right kidney. Additionally, within the upper pole is a
more focal region measuring 3.0 x 3.5 cm demonstrating a small
amount of adjacent perinephric edema, intrinsic markedly decreased
T2 signal intensity with marginal restricted diffusion and
heterogeneous enhancement areas of central non enhancement most in
keeping with a developing renal abscess. No hydronephrosis. No
loculated perinephric fluid collections.

Stomach/Bowel: Visualized portions within the abdomen are
unremarkable.

Vascular/Lymphatic: The abdominal vasculature is unremarkable.
Shotty para caval lymph node is again seen measuring 8 mm in short
axis diameter, possibly reactive in nature. No frankly pathologic
adenopathy within the abdomen and pelvis.

Other:  None.

Musculoskeletal: No suspicious bone lesions identified.
IMPRESSION: Normal examination of the gallbladder. Questionable mass simply
represents mucosal fold.

Heterogeneous enhancement of the right kidney in keeping with
changes of pyelonephritis. Superimposed more focal 3.5 cm collection
within the upper pole the right kidney in keeping with a developing
renal abscess. No discrete drainable fluid component identified at
this time. Follow-up evaluation following conservative therapy is
recommended to document resolution.

Shotty pericaval lymph node, possibly reactive in nature.

Mild cardiomegaly.

Small hiatal hernia.

## 2021-09-19 IMAGING — US US RENAL
1 series · 14 of 25 positions shown · non-contrast
Comparison: None.

CLINICAL DATA: Pyelonephritis

EXAM:
RENAL / URINARY TRACT ULTRASOUND COMPLETE

[Series 1: us renal · 14 of 51 slices shown]
[im 1/51]
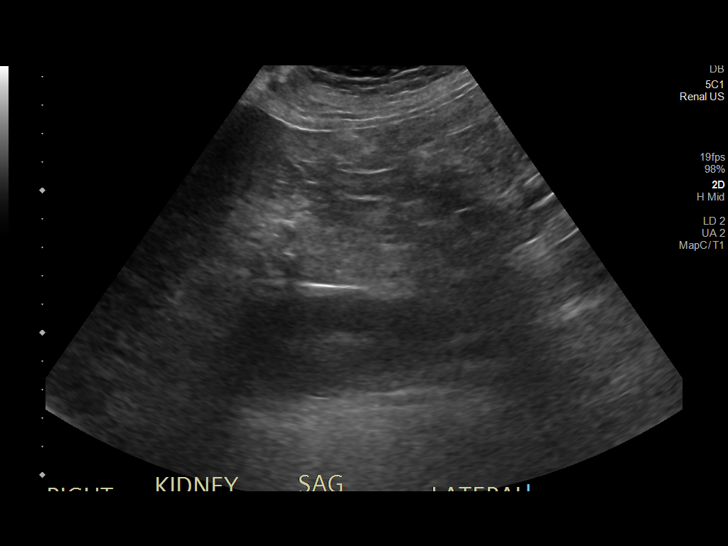
[im 5/51]
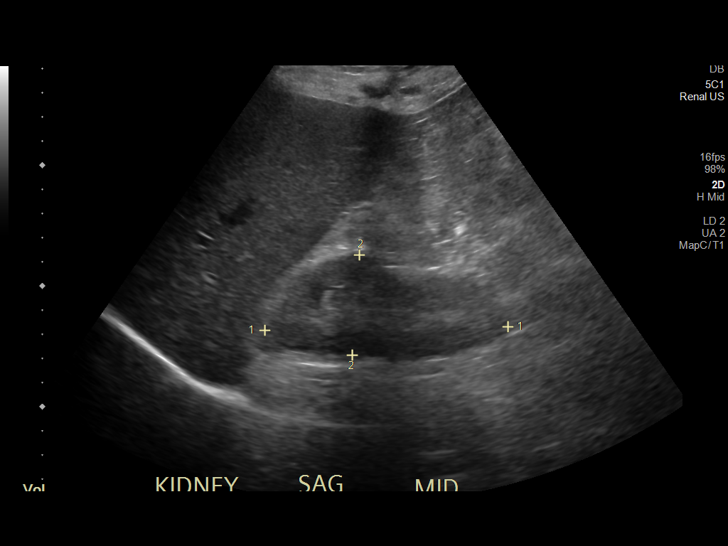
[im 9/51]
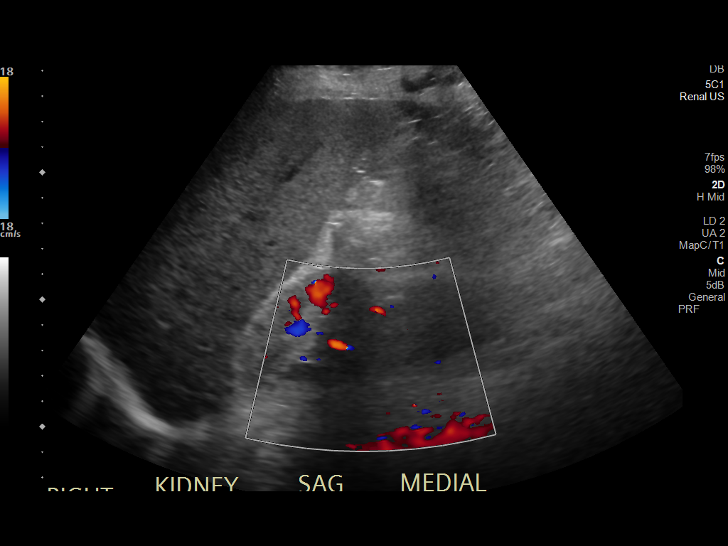
[im 13/51]
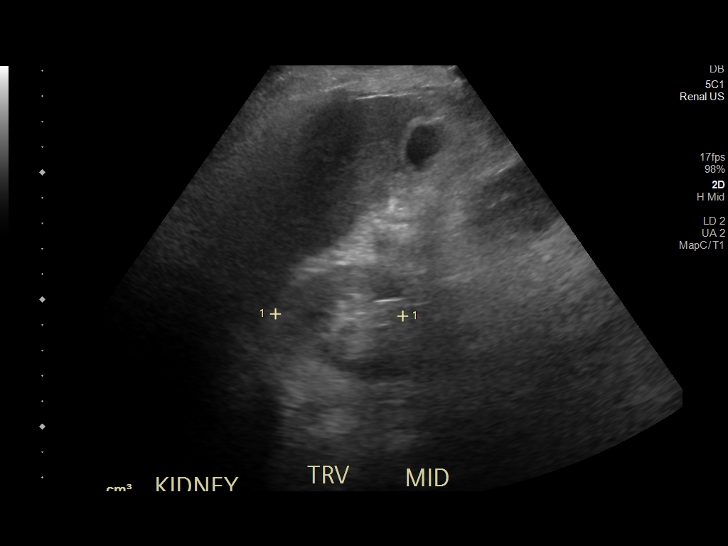
[im 17/51]
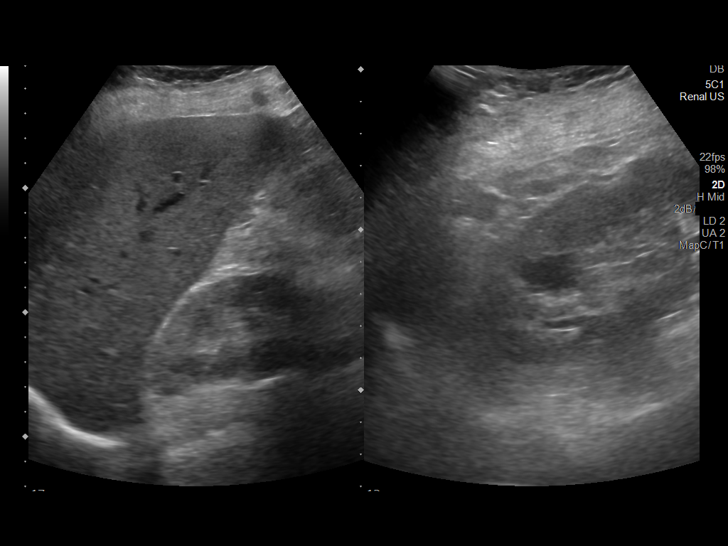
[im 19/51]
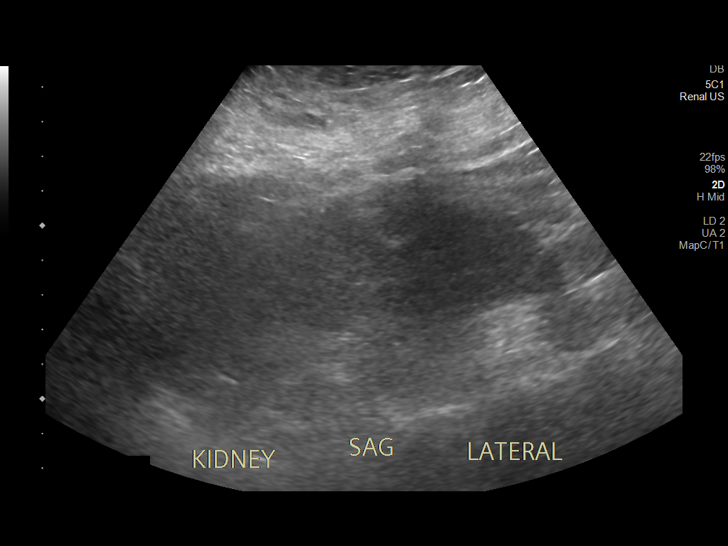
[im 23/51]
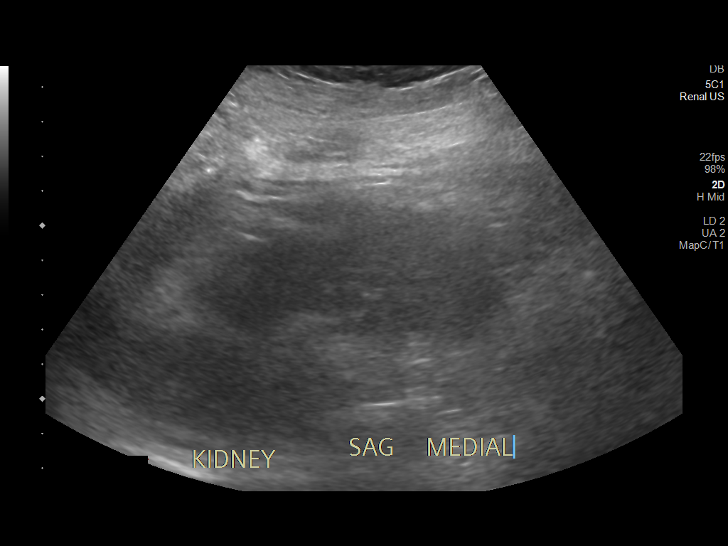
[im 28/51]
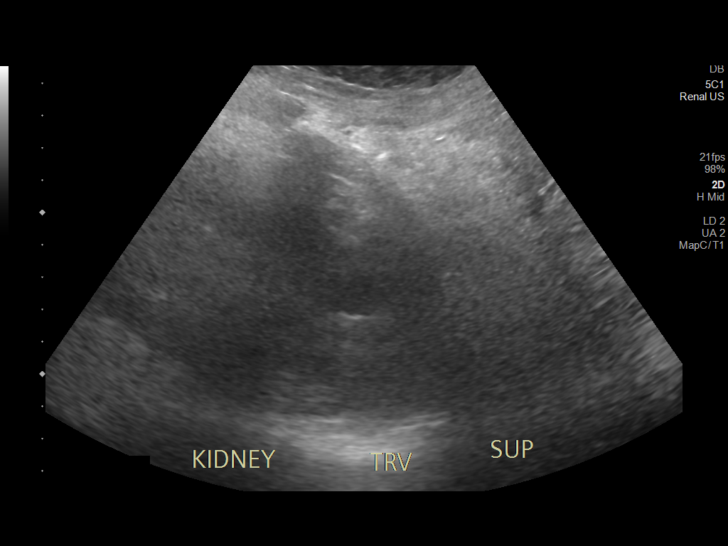
[im 32/51]
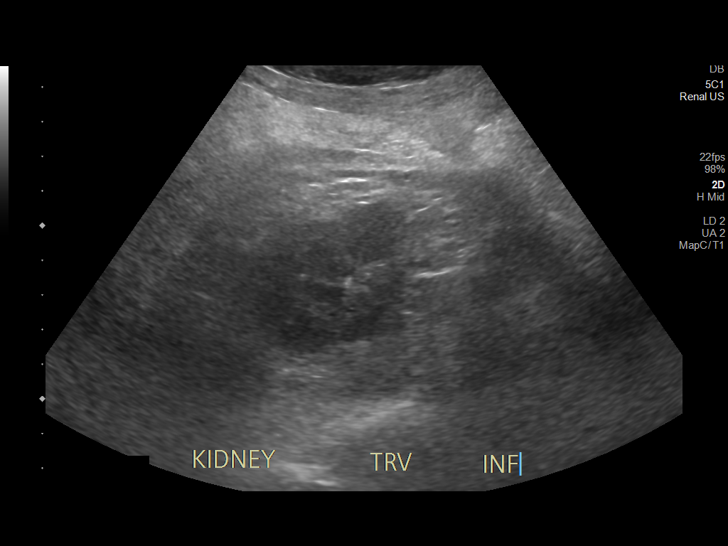
[im 34/51]
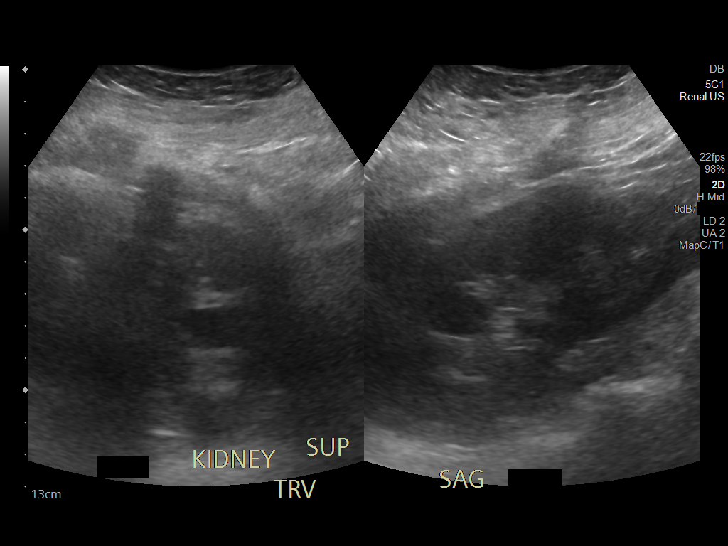
[im 38/51]
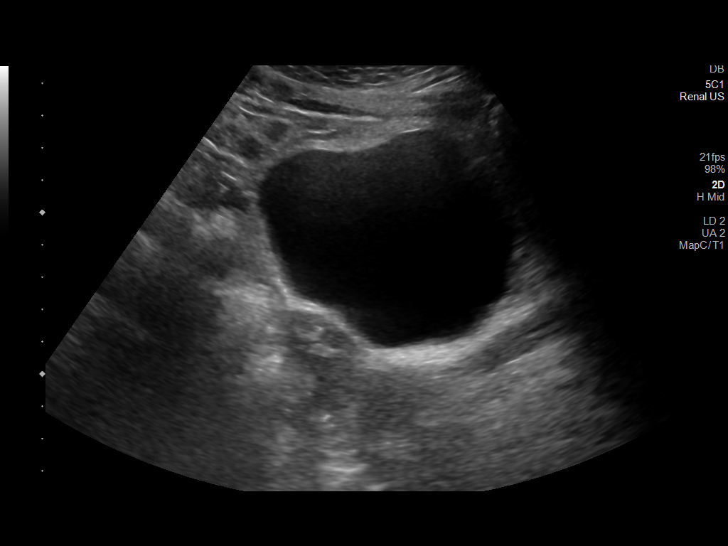
[im 42/51]
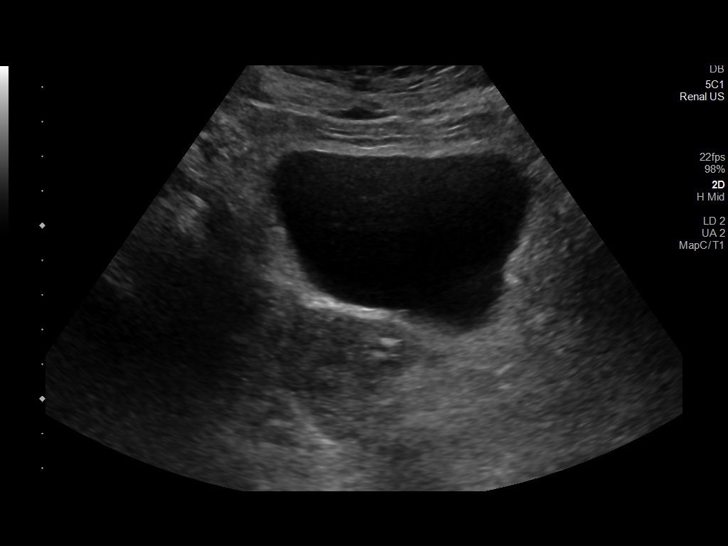
[im 46/51]
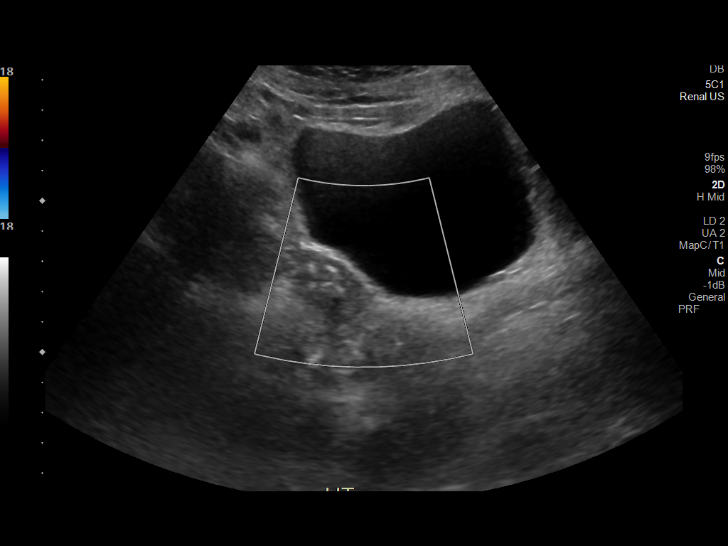
[im 51/51]
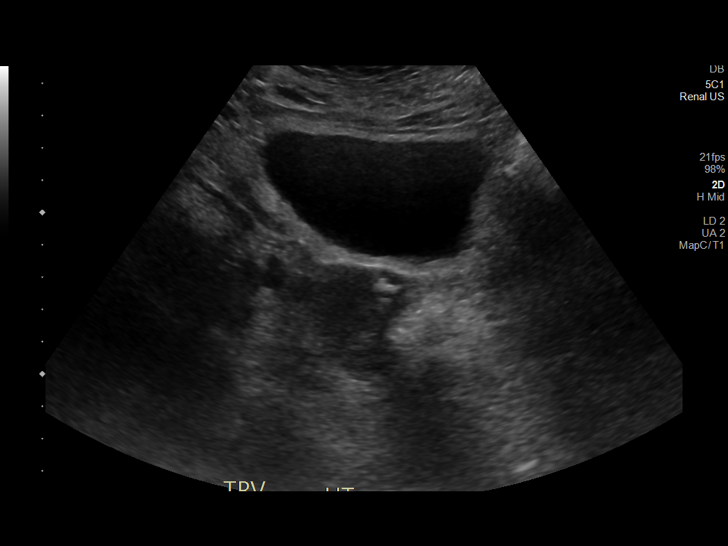

[14 of 25 positions shown; findings below may reference images not displayed]

FINDINGS: Right Kidney:

Renal measurements: 10.1 x 4.2 x 5.0 cm = volume: 110 mL.
Echogenicity within normal limits. No mass or hydronephrosis
visualized. No perinephric fluid collection identified. No
intrarenal calcifications are seen.

Left Kidney:

Renal measurements: 11.6 x 5.1 x 5.2 cm = volume: 160 mL.
Echogenicity within normal limits. No mass or hydronephrosis
visualized. No intrarenal calcifications. No perinephric fluid
collections. 2 cm simple cortical cyst noted within the upper pole.

Bladder:

Appears normal for degree of bladder distention.

Other:

None.
IMPRESSION: Normal renal sonogram. Parenchymal changes noted on MRI examination
are not well appreciated on this exam.

## 2021-09-19 IMAGING — CT CT HEAD W/O CM
4 series · 16 of 47 positions shown, 18 images · non-contrast
Comparison: [DATE].

CLINICAL DATA: Altered behavior. Metal status change. Nausea with 1
episode of vomiting.

EXAM:
CT HEAD WITHOUT CONTRAST
TECHNIQUE: Contiguous axial images were obtained from the base of the skull
through the vertex without intravenous contrast.

[Series 3: head without · axial · non-contrast · 0.45mm/px · z∈[-90,+25]mm · 7 of 31 slices shown, 9 images]
[im 4/31  brain]
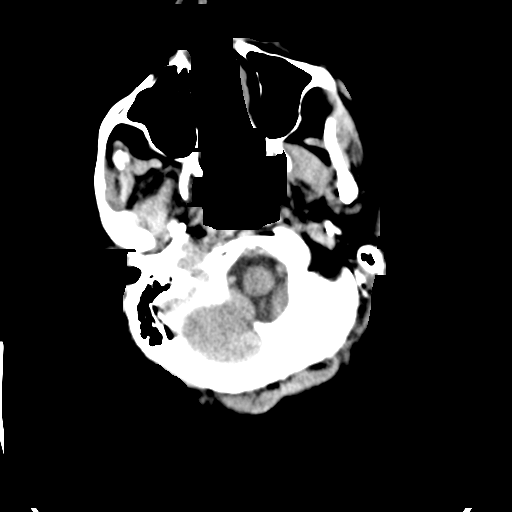
[im 4/31  bone]
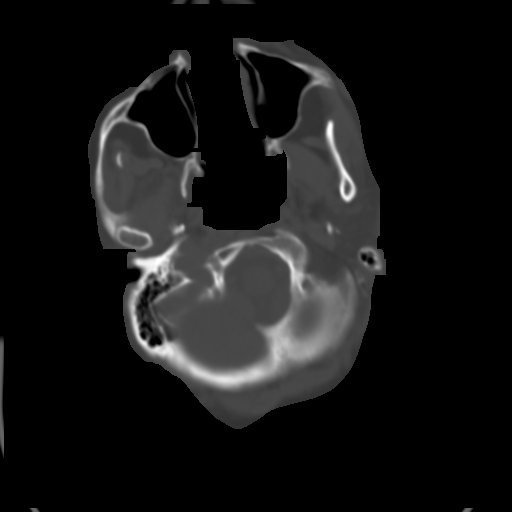
[im 8/31  brain]
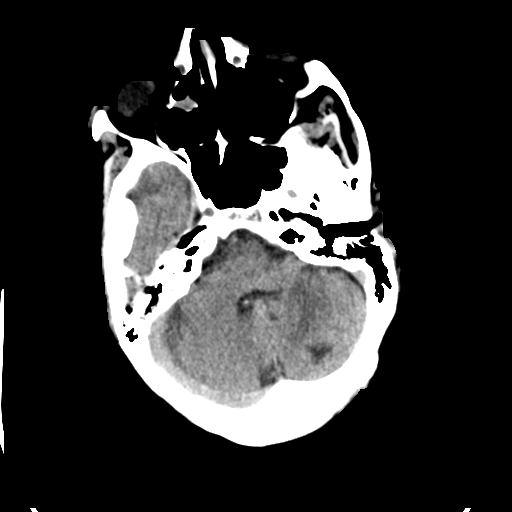
[im 12/31  brain]
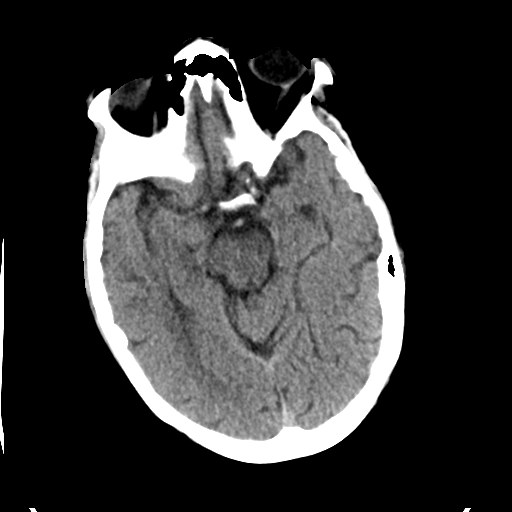
[im 16/31  brain]
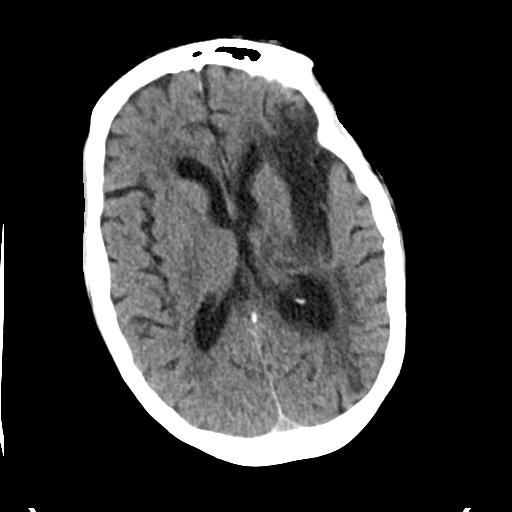
[im 19/31  brain]
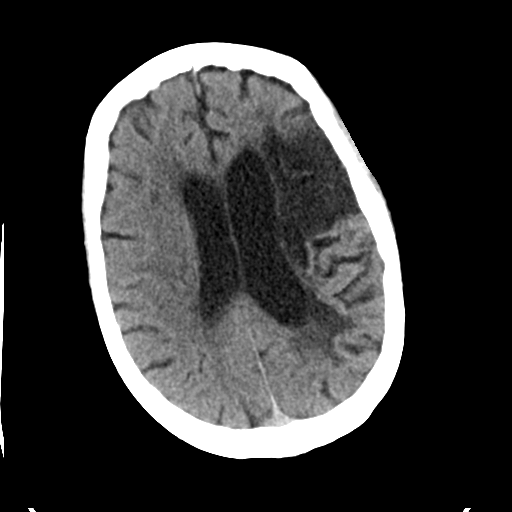
[im 19/31  bone]
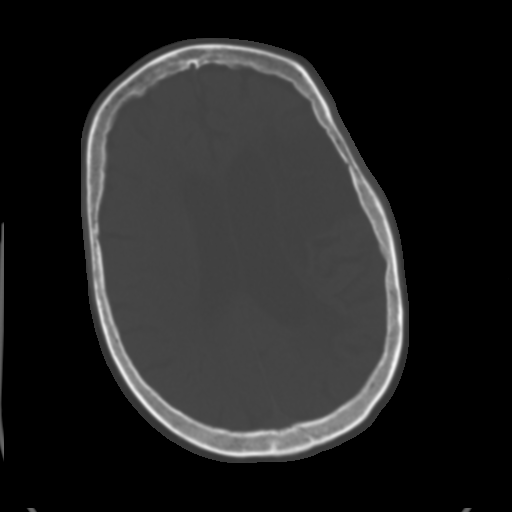
[im 23/31  brain]
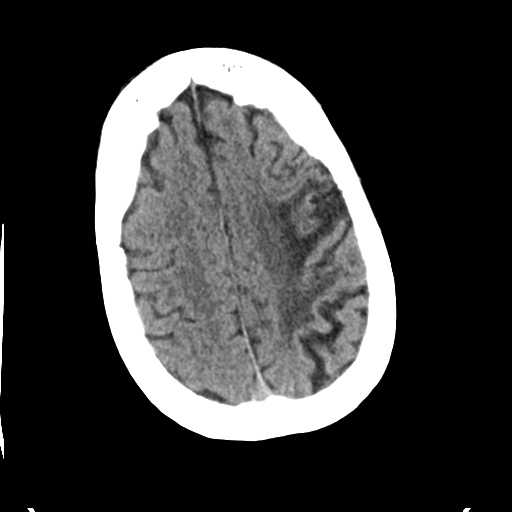
[im 27/31  brain]
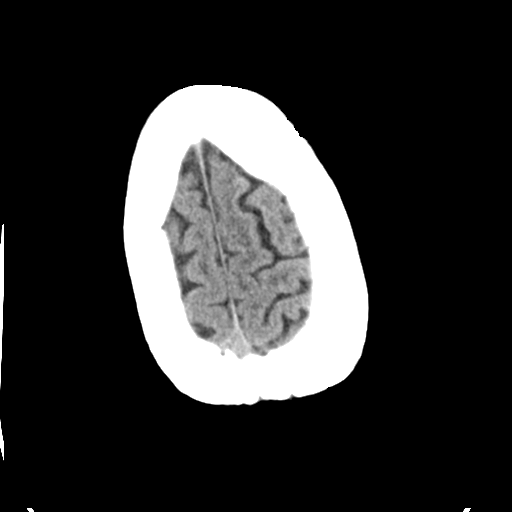

[Series 4: head bone · axial · 0.45mm/px · z∈[-91,-59]mm · 3 of 78 slices shown]
[im 8/78  bone]
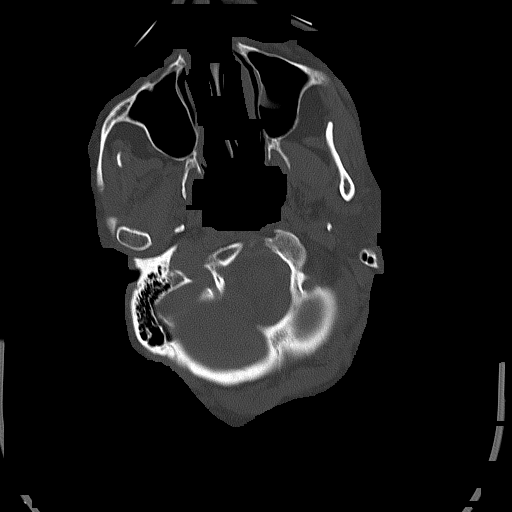
[im 16/78  bone]
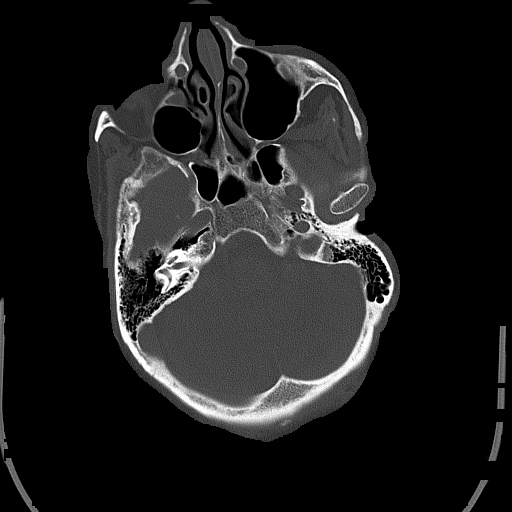
[im 24/78  bone]
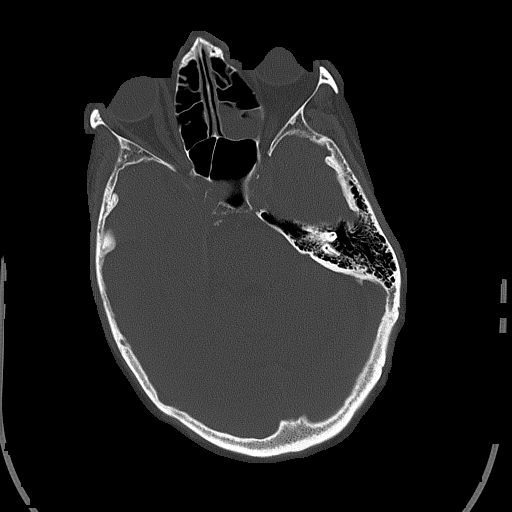

[Series 5: head without cor · coronal · non-contrast · 0.34mm/px · 3 of 74 slices shown]
[im 25/74  brain]
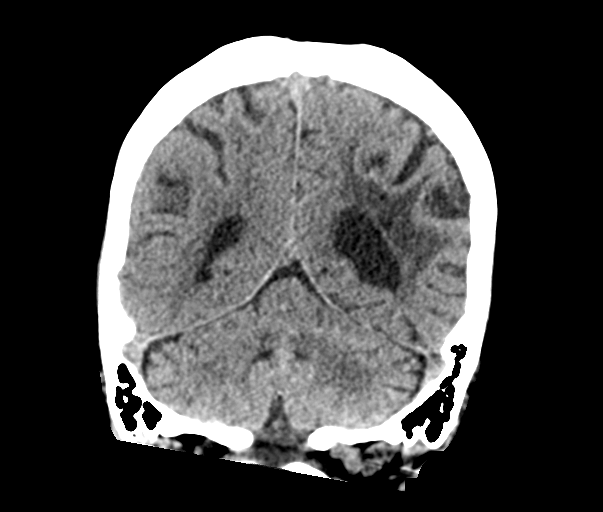
[im 33/74  brain]
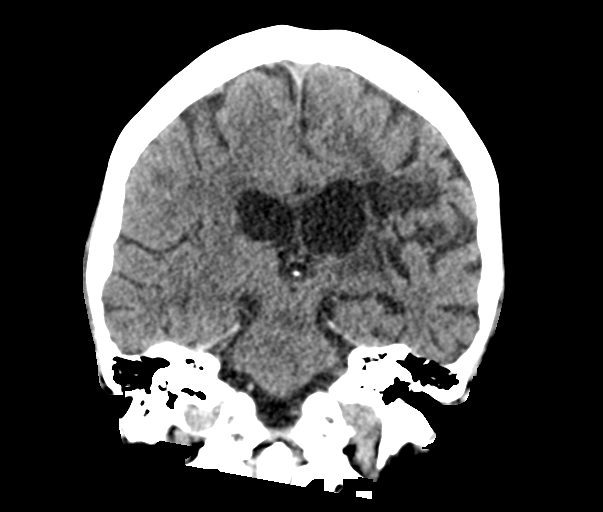
[im 41/74  brain]
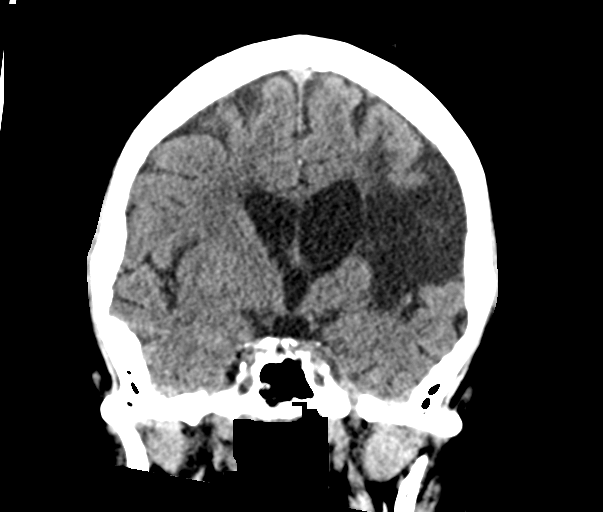

[Series 6: head without sag · sagittal · non-contrast · 0.41mm/px · 3 of 67 slices shown]
[im 26/67  brain]
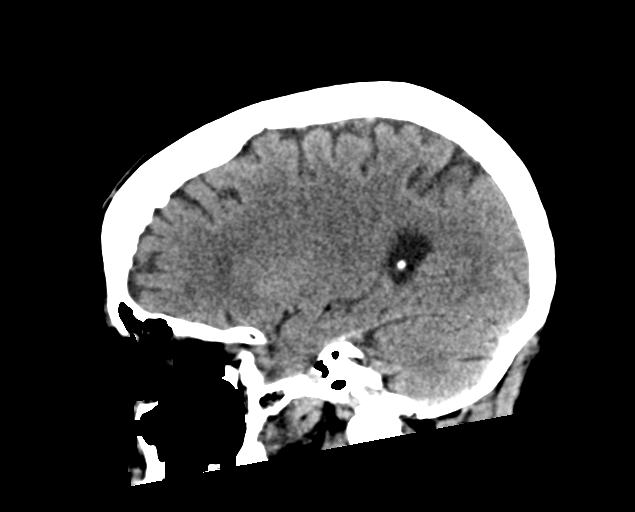
[im 34/67  brain]
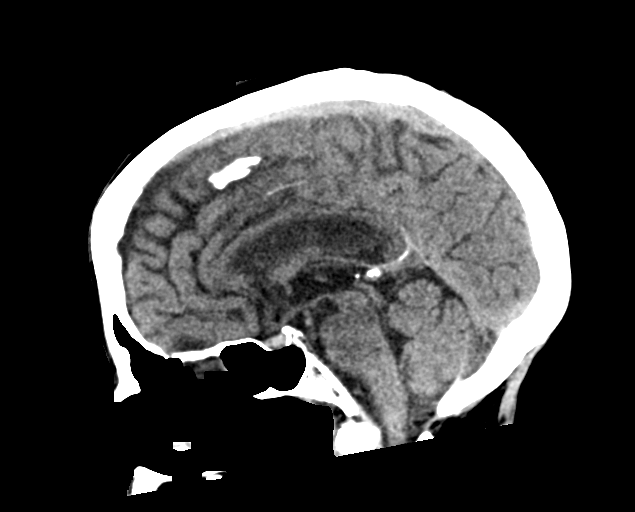
[im 41/67  brain]
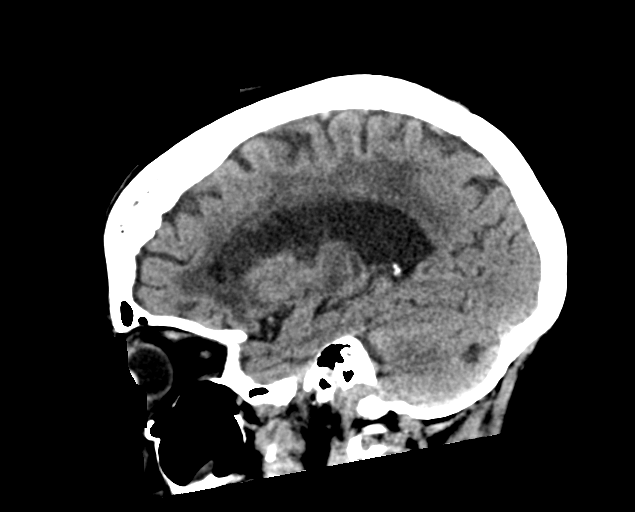

[16 of 47 positions shown; findings below may reference images not displayed]

FINDINGS: Brain: No evidence of acute infarction, hemorrhage, hydrocephalus,
extra-axial collection or mass lesion/mass effect.

Left MCA distribution infarct reflected by encephalomalacia with ex
vacuo dilation of the left lateral ventricle, evolving since the
prior head CT. Small hypoattenuating focus in the left cerebellum
consistent with a old lacunar infarct, new since the prior CT.

Vascular: No hyperdense vessel or unexpected calcification.

Skull: Normal. Negative for fracture or focal lesion.

Sinuses/Orbits: Globes and orbits are unremarkable. Mild mucosal
thickening in the left maxillary sinus. Posterior left ethmoid air
cells mostly opacified by mucosal thickening. Remaining sinuses are
clear.

Other: None.
IMPRESSION: 1. No acute intracranial abnormalities.
2. Old left MCA distribution infarct has evolved since the prior
head CT. Small left cerebellar infarct, also old.

## 2021-09-19 IMAGING — DX DG CHEST 1V PORT
1 series · 1 of 1 positions shown · non-contrast
Comparison: None.

CLINICAL DATA: Altered behavior. Nausea with 1 episode of vomiting.

EXAM:
PORTABLE CHEST 1 VIEW

[chest ap]
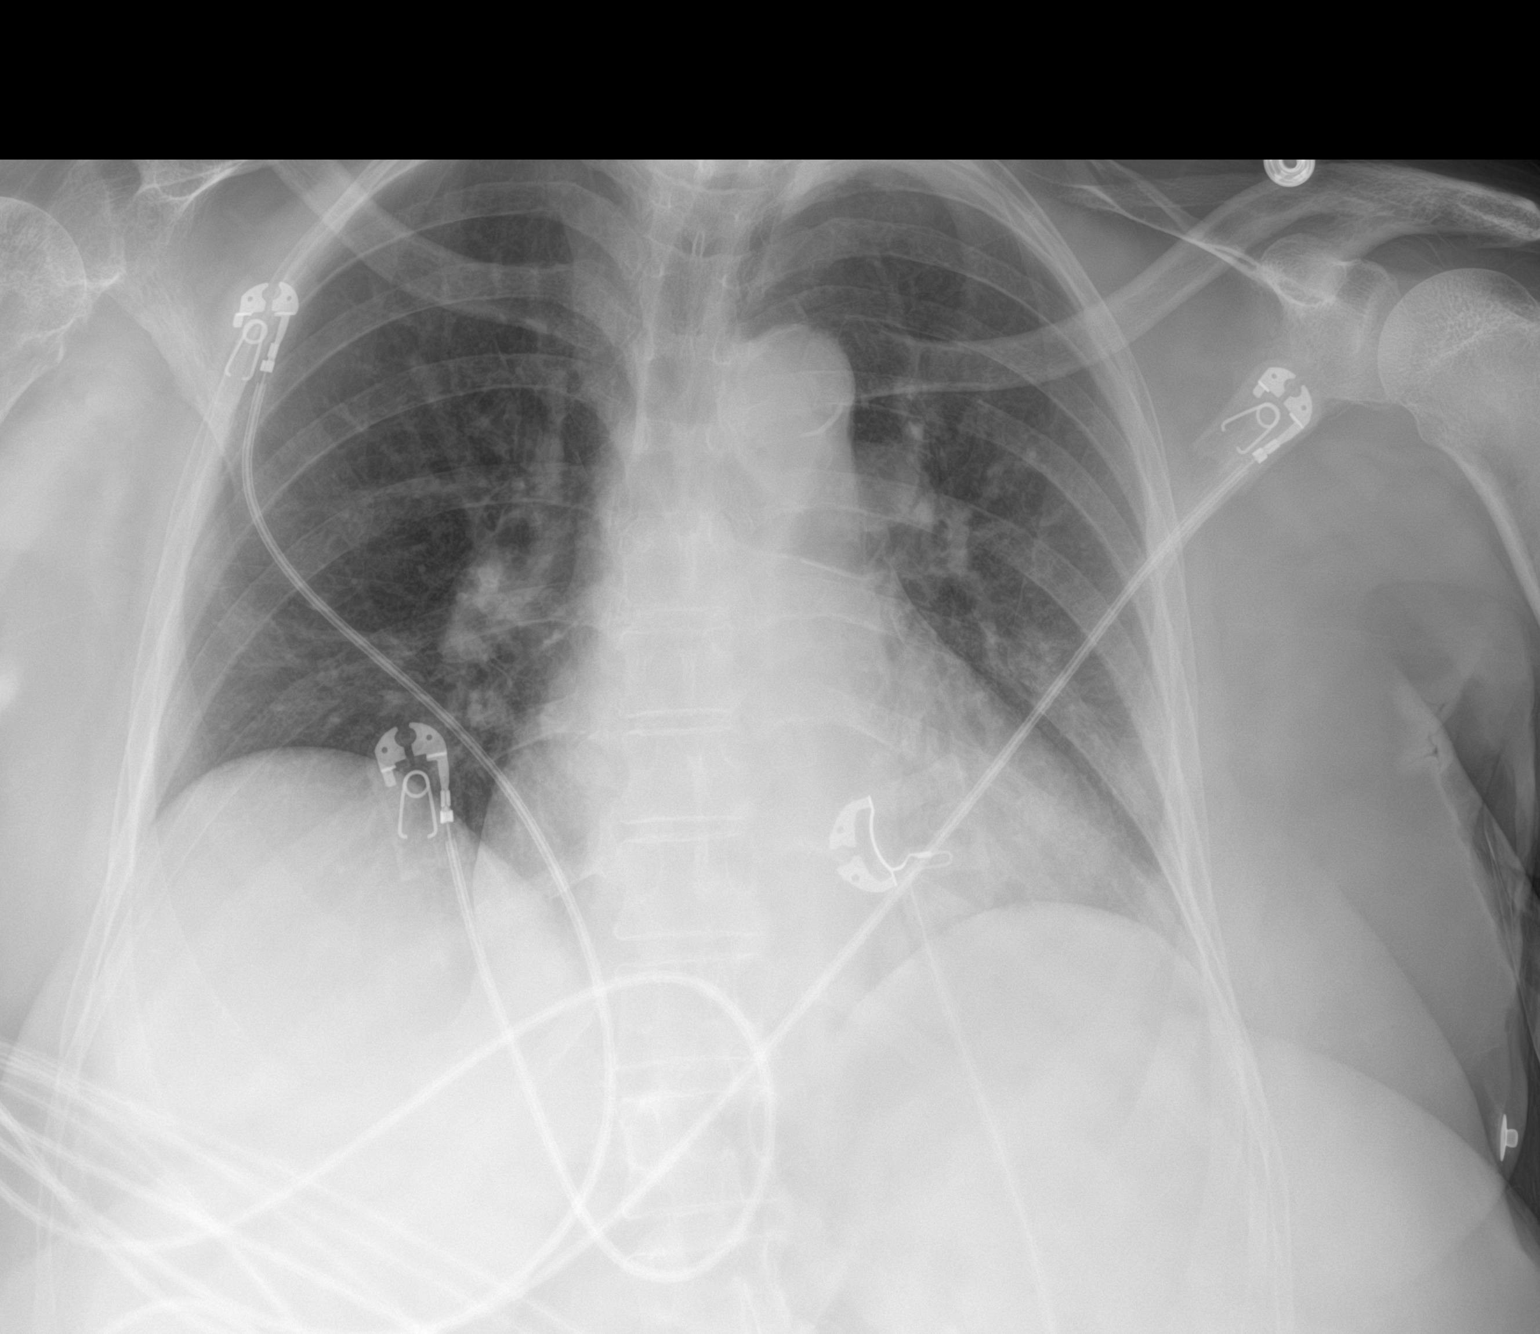

[1 of 1 positions shown; findings below may reference images not displayed]

FINDINGS: Cardiac silhouette mildly enlarged.  No mediastinal or hilar masses.

Clear lungs.  No pleural effusion.  No pneumothorax.

Skeletal structures are grossly intact.
IMPRESSION: No active disease.

## 2021-09-19 MED ORDER — POTASSIUM CHLORIDE CRYS ER 20 MEQ PO TBCR
40.0000 meq | EXTENDED_RELEASE_TABLET | Freq: Once | ORAL | Status: DC
  Filled 2021-09-19: qty 2

## 2021-09-19 MED ORDER — IOHEXOL 300 MG/ML  SOLN
100.0000 mL | Freq: Once | INTRAMUSCULAR | Status: AC | PRN
  Administered 2021-09-19: 13:00:00 100 mL via INTRAVENOUS

## 2021-09-19 MED ORDER — SODIUM CHLORIDE 0.9 % IV SOLN: 1.0000 g | INTRAVENOUS | Status: DC

## 2021-09-19 MED ORDER — SODIUM CHLORIDE 0.9 % IV SOLN
1.0000 g | Freq: Once | INTRAVENOUS | Status: AC
  Administered 2021-09-19: 16:00:00 1 g via INTRAVENOUS
  Filled 2021-09-19: qty 10

## 2021-09-19 MED ORDER — ATORVASTATIN CALCIUM 10 MG PO TABS
20.0000 mg | ORAL_TABLET | Freq: Every day | ORAL | Status: DC
  Administered 2021-09-20 – 2021-09-23 (×4): 20 mg via ORAL
  Filled 2021-09-19 (×4): qty 2

## 2021-09-19 MED ORDER — AMLODIPINE BESYLATE 5 MG PO TABS
5.0000 mg | ORAL_TABLET | Freq: Every day | ORAL | Status: DC
  Administered 2021-09-20 – 2021-09-24 (×4): 5 mg via ORAL
  Filled 2021-09-19 (×4): qty 1

## 2021-09-19 MED ORDER — ENOXAPARIN SODIUM 40 MG/0.4ML IJ SOSY: 40.0000 mg | PREFILLED_SYRINGE | INTRAMUSCULAR | Status: DC

## 2021-09-19 MED ORDER — POTASSIUM CHLORIDE 20 MEQ PO PACK
40.0000 meq | PACK | Freq: Once | ORAL | Status: AC
  Administered 2021-09-19: 20:00:00 40 meq via ORAL
  Filled 2021-09-19: qty 2

## 2021-09-19 MED ORDER — METOPROLOL TARTRATE 50 MG PO TABS
50.0000 mg | ORAL_TABLET | Freq: Two times a day (BID) | ORAL | Status: DC
  Administered 2021-09-19 – 2021-09-22 (×7): 50 mg via ORAL
  Filled 2021-09-19 (×3): qty 1
  Filled 2021-09-19: qty 2
  Filled 2021-09-19: qty 1
  Filled 2021-09-19 (×2): qty 2
  Filled 2021-09-19: qty 1

## 2021-09-19 MED ORDER — SODIUM CHLORIDE 0.9 % IV BOLUS
500.0000 mL | Freq: Once | INTRAVENOUS | Status: AC
  Administered 2021-09-19: 13:00:00 500 mL via INTRAVENOUS

## 2021-09-19 MED ORDER — GADOBUTROL 1 MMOL/ML IV SOLN
7.0000 mL | Freq: Once | INTRAVENOUS | Status: AC | PRN
  Administered 2021-09-19: 18:00:00 7 mL via INTRAVENOUS

## 2021-09-19 MED ORDER — APIXABAN 5 MG PO TABS
5.0000 mg | ORAL_TABLET | Freq: Two times a day (BID) | ORAL | Status: DC
  Administered 2021-09-19 – 2021-09-22 (×6): 5 mg via ORAL
  Filled 2021-09-19 (×6): qty 1

## 2021-09-19 MED ORDER — ONDANSETRON HCL 4 MG/2ML IJ SOLN
4.0000 mg | Freq: Once | INTRAMUSCULAR | Status: AC
  Administered 2021-09-19: 12:00:00 4 mg via INTRAVENOUS
  Filled 2021-09-19: qty 2

## 2021-09-19 MED ORDER — LACTATED RINGERS IV SOLN: INTRAVENOUS | Status: AC

## 2021-09-19 MED ORDER — ONDANSETRON HCL 4 MG/2ML IJ SOLN
4.0000 mg | Freq: Once | INTRAMUSCULAR | Status: AC
  Administered 2021-09-19: 16:00:00 4 mg via INTRAVENOUS
  Filled 2021-09-19: qty 2

## 2021-09-19 MED ORDER — SODIUM CHLORIDE 0.9 % IV SOLN
1.0000 g | INTRAVENOUS | Status: AC
  Administered 2021-09-20: 12:00:00 1 g via INTRAVENOUS
  Filled 2021-09-19: qty 10

## 2021-09-19 NOTE — ED Notes (Signed)
X-ray at bedside

## 2021-09-19 NOTE — ED Notes (Signed)
Patient transported to CT 

## 2021-09-19 NOTE — ED Triage Notes (Addendum)
Pt BIB [facility] from home d/t c/o pt "not acting herself" (per family). She was nauseated & vomited once with diaphoresis. 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 Family reports NO new s/s in that area. EMS reports she vomited twice while en route to ED some clear fluid & was A-fib on their monitor with Hx of the same. 172/83, 100% on RA, 84-130 bpm, 97.8 Temp, CBG 156, 20g PIV in Lt AC>

## 2021-09-19 NOTE — ED Provider Notes (Addendum)
[facility] EMERGENCY DEPARTMENT Provider Note   CSN: [id number] Arrival date & time: 09/19/21  1120     History Chief Complaint  Patient presents with   Nausea   Emesis   Diaphoretic    Lindsey [name] is a 65 y.o. female.  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  Per family at bedside Patient seems well this morning when she woke up.  However after being shower this morning she had an episode where she became diaphoretic and seemed to become less responsive.  Did not have any jerking movements and did not seem to fully lose consciousness.  She then vomited 3 times which was nonbloody nonbilious emesis. No hx of seizures. No tongue laceration or lip biting.  Per daughter she has baseline aphasia since her last stroke (left MCA w aphasia and R hemiparesis) but seems more confused and less vocal now than usual.   Level 5 caveat due to aphasia -patient is currently not speaking well.  Shakes her head no when asked questions. Per daughter she feels she is reliably answering. Shakes head when asked about pain, fever, cough, difficulty breathing, CP, Abd pain or discomfort with urination.   When asked if she feels any new weakness shakes her head no. Denies being numb in any extremity. Denies any vertigo or LH. No syncope or head injury.      Past Medical History:  Diagnosis Date   Diverticulosis    Hypertension    Uterine prolapse     Patient Active Problem List   Diagnosis Date Noted   Pain due to onychomycosis of toenails of both feet 11/19/2020   Anemia 05/31/2020   Acute ischemic left MCA stroke ([place]) 04/30/2020   Right hemiplegia ([place]) 04/30/2020   CVA (cerebral vascular accident) ([place]) 04/21/2020   Unspecified atrial fibrillation ([place]) 04/21/2020   Essential hypertension 04/21/2020   Alcohol abuse 04/21/2020    History reviewed. No  pertinent surgical history.   OB History   No obstetric history on file.     Family History  Problem Relation Age of Onset   Breast cancer Neg Hx     Social History   Tobacco Use   Smoking status: Never   Smokeless tobacco: Never  Vaping Use   Vaping Use: Never used  Substance Use Topics   Alcohol use: Not Currently    Comment: states once a wine/beer occassionally    Drug use: No    Home Medications Prior to Admission medications   Medication Sig Start Date End Date Taking? Authorizing Provider  acetaminophen (TYLENOL) 325 MG tablet Take 1-2 tablets (325-650 mg total) by mouth every 4 (four) hours as needed for mild pain. 05/29/20   Love, Ivan Anchors, PA-C  amLODipine (NORVASC) 5 MG tablet Take 1 tablet (5 mg total) by mouth daily. 08/25/20   Raulkar, Clide Deutscher, MD  apixaban (ELIQUIS) 5 MG TABS tablet Take 1 tablet (5 mg total) by mouth 2 (two) times daily. 07/20/21   Raulkar, Clide Deutscher, MD  atorvastatin (LIPITOR) 20 MG tablet Take 1 tablet (20 mg total) by mouth daily at 6 PM. 06/29/21   Raulkar, Clide Deutscher, MD  diclofenac Sodium (VOLTAREN) 1 % GEL Apply 2 g topically 3 (three) times daily. 05/29/20   Love, Ivan Anchors, PA-C  gabapentin (NEURONTIN) 100 MG capsule Take 1 capsule (100 mg total) by mouth 2 (two) times daily. 05/29/20  Love, Pamela S, PA-C  metoprolol tartrate (LOPRESSOR) 50 MG tablet Take 1 tablet (50 mg total) by mouth 2 (two) times daily. 05/29/20   Love, Ivan Anchors, PA-C  mirtazapine (REMERON SOL-TAB) 15 MG disintegrating tablet Take 1 tablet (15 mg total) by mouth at bedtime. 05/29/20   Love, Ivan Anchors, PA-C  Mouthwashes (MOUTH RINSE) LIQD solution 15 mLs by Mouth Rinse route 2 times daily at 12 noon and 4 pm. 05/29/20   Love, Ivan Anchors, PA-C    Allergies    Patient has no known allergies.  Review of Systems   Review of Systems  Unable to perform ROS: Other (aphasia)   Physical Exam Updated Vital Signs BP (!) 146/79    Pulse 72    Temp (!) 96.6 F (35.9 C) (Rectal)     Resp 19    SpO2 99%   Physical Exam Vitals and nursing note reviewed.  Constitutional:      Comments: Chronically ill-appearing 65 year old female in no acute distress. Shakes head yes and no to answer questions. Will answer AO Qs but not answering with complete sentences   HENT:     Head: Normocephalic and atraumatic.     Nose: Nose normal.  Eyes:     General: No scleral icterus. Cardiovascular:     Rate and Rhythm: Normal rate and regular rhythm.     Pulses: Normal pulses.     Heart sounds: Normal heart sounds.  Pulmonary:     Effort: Pulmonary effort is normal. No respiratory distress.     Breath sounds: No wheezing.  Abdominal:     Palpations: Abdomen is soft.     Tenderness: There is abdominal tenderness.     Comments: Right upper quadrant abdominal tenderness.  Right lower quadrant abdominal tenderness  No guarding or rebound.  Musculoskeletal:     Cervical back: Normal range of motion.     Right lower leg: No edema.     Left lower leg: No edema.  Skin:    General: Skin is warm and dry.     Capillary Refill: Capillary refill takes less than 2 seconds.     Comments: Sacrum without significant wounds/cellulitis  Neurological:     Mental Status: She is alert.     Comments: Paralysis of right upper extremity and right lower extremity.  LUE/LLE weak but appropriate for patient appearance   EOMI no nystagmus   Sensation intact in all four extremities   Psychiatric:        Mood and Affect: Mood normal.        Behavior: Behavior normal.    ED Results / Procedures / Treatments   Labs (all labs ordered are listed, but only abnormal results are displayed) Labs Reviewed  CBC - Abnormal; Notable for the following components:      Result Value   RBC 3.60 (*)    Hemoglobin 10.5 (*)    HCT 31.9 (*)    All other components within normal limits  COMPREHENSIVE METABOLIC PANEL - Abnormal; Notable for the following components:   Potassium 3.0 (*)    CO2 21 (*)     Glucose, Bld 181 (*)    BUN <5 (*)    Total Protein 8.2 (*)    Albumin 3.2 (*)    All other components within normal limits  URINALYSIS, ROUTINE W REFLEX MICROSCOPIC - Abnormal; Notable for the following components:   Hgb urine dipstick SMALL (*)    Protein, ur 30 (*)    All other  components within normal limits  CBG MONITORING, ED - Abnormal; Notable for the following components:   Glucose-Capillary 142 (*)    All other components within normal limits  TROPONIN I (HIGH SENSITIVITY) - Abnormal; Notable for the following components:   Troponin I (High Sensitivity) 33 (*)    All other components within normal limits  TROPONIN I (HIGH SENSITIVITY) - Abnormal; Notable for the following components:   Troponin I (High Sensitivity) 42 (*)    All other components within normal limits  RESP PANEL BY RT-PCR (FLU A&B, COVID) [id number]  URINE CULTURE  LIPASE, BLOOD  URINALYSIS, MICROSCOPIC (REFLEX)    EKG EKG Interpretation  Date/Time:  Sunday September 19 2021 11:46:59 EST Ventricular Rate:  100 PR Interval:    QRS Duration: 90 QT Interval:  358 QTC Calculation: 462 R Axis:   19 Text Interpretation: Atrial fibrillation Borderline T abnormalities, diffuse leads Confirmed by Pattricia Boss [phone]) on 09/19/2021 2:16:40 PM  Radiology CT Head Wo Contrast  Result Date: 09/19/2021 CLINICAL DATA:  Altered behavior. Metal status change. Nausea with 1 episode of vomiting. EXAM: CT HEAD WITHOUT CONTRAST TECHNIQUE: Contiguous axial images were obtained from the base of the skull through the vertex without intravenous contrast. COMPARISON:  05/11/2020. FINDINGS: Brain: No evidence of acute infarction, hemorrhage, hydrocephalus, extra-axial collection or mass lesion/mass effect. Left MCA distribution infarct reflected by encephalomalacia with ex vacuo dilation of the left lateral ventricle, evolving since the prior head CT. Small hypoattenuating focus in the left cerebellum consistent with a old lacunar  infarct, new since the prior CT. Vascular: No hyperdense vessel or unexpected calcification. Skull: Normal. Negative for fracture or focal lesion. Sinuses/Orbits: Globes and orbits are unremarkable. Mild mucosal thickening in the left maxillary sinus. Posterior left ethmoid air cells mostly opacified by mucosal thickening. Remaining sinuses are clear. Other: None. IMPRESSION: 1. No acute intracranial abnormalities. 2. Old left MCA distribution infarct has evolved since the prior head CT. Small left cerebellar infarct, also old. Electronically Signed   By: Lajean Manes M.D.   On: 09/19/2021 12:28   CT ABDOMEN PELVIS W CONTRAST  Result Date: 09/19/2021 CLINICAL DATA:  Nausea and vomiting. EXAM: CT ABDOMEN AND PELVIS WITH CONTRAST TECHNIQUE: Multidetector CT imaging of the abdomen and pelvis was performed using the standard protocol following bolus administration of intravenous contrast. CONTRAST:  182mL OMNIPAQUE IOHEXOL 300 MG/ML  SOLN COMPARISON:  CT 08/17/2014 FINDINGS: Lower chest: No acute abnormality. Subsegmental atelectasis at the lung bases. Biatrial cardiac enlargement. Hepatobiliary: No focal liver abnormality is seen. A 0.9 cm hyperdensity is seen within the gallbladder with C-shaped configuration of the gallbladder body (series 3, images 24-25). No gallbladder wall thickening or pericholecystic fluid. No bile duct dilatation. Pancreas: Mild dilatation of the main pancreatic duct within the pancreatic head measuring up to 0.5 cm in diameter (series 3, image 29; series 6, image 30). The remainder of the pancreatic duct is normal caliber. No peripancreatic fat stranding or fluid collection. Spleen: Normal in size without focal abnormality. Adrenals/Urinary Tract: The adrenal glands are unremarkable. Focal hypoenhancement at the upper pole of the right kidney (series 3, image 24) as well as at the posterior and lateral aspects of the middle third of the right kidney (series 3, image 29, 33). There is  subtle fat stranding at the right hilum. Cortical scarring noted at the upper pole of the left kidney. A few simple cortical and parapelvic cysts are noted in the left kidney. No hydronephrosis or nephrolithiasis. Urinary bladder is unremarkable. Stomach/Bowel:  Stomach is within normal limits. Appendix appears normal. Scattered diverticula of the transverse and descending colon. No evidence of bowel wall thickening, distention, or inflammatory changes. Vascular/Lymphatic: No significant vascular findings are present. An enlarged 1.0 cm short axis superior aortocaval lymph node is noted (series 3, image 20). No other enlarged lymph nodes in the abdomen or pelvis. 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 The adnexa are unremarkable. Other: No abdominal wall hernia or abnormality. No abdominopelvic ascites. Musculoskeletal: No acute or significant osseous findings. IMPRESSION: 1. Multifocal pyelonephritis in the right kidney. No perinephric fluid collection. 2. An enlarged superior aortocaval lymph node is noted, likely reactive. Recommend attention on follow-up imaging. 3. Mild enlargement of the main pancreatic duct within the pancreatic head. When the patient is clinically stable and able to follow directions and hold their breath (preferably as an outpatient) further evaluation with dedicated abdominal MRI should be considered. 4. A 0.9 cm hyperdensity at the gallbladder body with associated C-shaped configuration of the gallbladder is worrisome for possible gallbladder wall mass. This can also be further evaluated on abdominal MRI. Electronically Signed   By: Sherron Ales M.D.   On: 09/19/2021 14:09   DG Chest Port 1 View  Result Date: 09/19/2021 CLINICAL DATA:  Altered behavior. Nausea with 1 episode of vomiting. EXAM: PORTABLE CHEST 1 VIEW COMPARISON:  None. FINDINGS: Cardiac silhouette mildly enlarged.  No mediastinal or hilar masses. Clear lungs.  No pleural effusion.   No pneumothorax. Skeletal structures are grossly intact. IMPRESSION: No active disease. Electronically Signed   By: Amie Portland M.D.   On: 09/19/2021 12:24    Procedures Procedures   Medications Ordered in ED Medications  potassium chloride SA (KLOR-CON M) CR tablet 40 mEq (0 mEq Oral Hold 09/19/21 1542)  cefTRIAXone (ROCEPHIN) 1 g in sodium chloride 0.9 % 100 mL IVPB (1 g Intravenous New Bag/Given 09/19/21 1537)  ondansetron (ZOFRAN) injection 4 mg (4 mg Intravenous Given 09/19/21 1141)  sodium chloride 0.9 % bolus 500 mL (0 mLs Intravenous Stopped 09/19/21 1426)  iohexol (OMNIPAQUE) 300 MG/ML solution 100 mL (100 mLs Intravenous Contrast Given 09/19/21 1315)  ondansetron (ZOFRAN) injection 4 mg (4 mg Intravenous Given 09/19/21 1543)    ED Course  I have reviewed the triage vital signs and the nursing notes.  Pertinent labs & imaging results that were available during my care of the patient were reviewed by me and considered in my medical decision making (see chart for details).  Clinical Course as of 09/19/21 1557  Sun Sep 19, 2021  1311 IMPRESSION: 1. No acute intracranial abnormalities. 2. Old left MCA distribution infarct has evolved since the prior head CT. Small left cerebellar infarct, also old.   [WF]  1311 CXR IMPRESSION: No active disease.   [WF]  1544 Cons placed to general surgery 3:45 PM  [WF]  1551 Discussed w/ Dr Cliffton Asters of general surgery.  Not particularly impressed by her gallbladder appearance.  Recommends MRI.  [WF]    Clinical Course User Index [WF] Gailen Shelter, [name]   MDM Rules/Calculators/A&P                          Patient is a 65 year old female presented to the emergency room today with daughter at bedside.  She is able to provide additional history.  Per daughter patient had an episode at approximately 10 AM this morning where she became very diaphoretic, seemed confused and was looking back and  forth rapidly.  She did not lose  consciousness during this episode should not urinate or defecate. Seemed altered from baseline and had some slurred speech (baseline aphasia however) concerning for post-ictal state. However patient seems to have improving mental status per daughter. Will closely monitor due to concern for seizure but without features such as bowel/bladder incontinence.   Her symptoms resolved but she did have several episodes of nonbloody nonbilious emesis.  Was brought by EMS who witnessed 2 other episodes of emesis.  Received Zofran has been without any episodes of emesis until approximately 345 she had another 2 episodes of emesis.  Was given another dose of Zofran.  Daughter expresses some concern for stroke. Neuro exam limited by patient's baseline deficits. No significant new neuro deficits with improving mental status. Will continue to re-evaluate for mental status changes / neuro changes.  Physical exam unremarkable.  Patient has hemiparesis of the right side.  Has aphasia. Sensation in all four extremities. Grossly appropriate strength on left side of body. Does reliably have right upper abd TTP. Biliary disease considered on differential.   Daughter expresses some concern for stroke. Neuro exam limited by patient's baseline deficits. No significant new neuro deficits with improving mental status.  Per daughter she is less responsive and interactive than she normally is.  She is shaking her head yes and no to answer questions asked.  Concern for intraabdominal infection. Will obtain CTAP.   CBC with anemia but no leukocytosis.  CMP notable for hypokalemia at 3 will replete p.o.  Troponin without any significant delta.  She denies any chest pain or difficulty breathing.  CT scan with evidence of possible pyelonephritis.  Will obtain urine culture, urinalysis relatively unremarkable.  Will treat empirically with 1 g of Rocephin IV.  Also on CT there is some concern for right upper quadrant mass.  Gallbladder  seems deformed around mass.  Discussed with Dr. Dema Severin of surgery who recommends MRI. I discussed this case with my attending physician who cosigned this note including patient's presenting symptoms, physical exam, and planned diagnostics and interventions. Attending physician stated agreement with plan or made changes to plan which were implemented. Plan is to admit to medicine for pyelonephritis/renal infection w MRI abd pending. Abx started. Pt mental status seems to have improved gradually since her episode this AM (per daughter) perhaps infection-mediated vs ?seizure (less likely). Per daughter no new worsening or episodes. Signed out to oncoming team for admission Domenic Moras.   Final Clinical Impression(s) / ED Diagnoses Final diagnoses:  Pyelonephritis  RUQ abdominal mass  Abnormal CT of the abdomen  Altered mental status, unspecified altered mental status type    Rx / DC Orders ED Discharge Orders     None        Tedd Sias, [place] 09/19/21 1600    Pati Gallo [place], [place] 09/25/21 1351    Pattricia Boss, MD 09/28/21 1148

## 2021-09-19 NOTE — ED Provider Notes (Signed)
Received sign out from previous provider, please see his note for complete H&P.  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  She also had several emesis throughout the day.    On exam, pt elicit tenderness to RUQ.  CT scan of abd/pelvis obtained showing multifocal pyelonephritis.  Rocephin was given.  There were some changes around the gallbladder region. General surgery Dr. Dema Severin was consulted who recommend MRI for further assessment.  Urine culture sent.  Troponin is trending up but suspect it's 2/2 rate related afib.  Pt without CP.    4:59 PM Appreciate consultation from Internal Medicine resident who agrees to see and will admit pt for further care.   BP (!) 133/108    Pulse 82    Temp (!) 96.6 F (35.9 C) (Rectal)    Resp 17    SpO2 100%   Results for orders placed or performed during the hospital encounter of 09/19/21  Resp Panel by RT-PCR (Flu A&B, Covid) Nasopharyngeal Swab   Specimen: Nasopharyngeal Swab; Nasopharyngeal(NP) swabs in vial transport medium  Result Value Ref Range   SARS Coronavirus 2 by RT PCR NEGATIVE NEGATIVE   Influenza A by PCR NEGATIVE NEGATIVE   Influenza B by PCR NEGATIVE NEGATIVE  CBC  Result Value Ref Range   WBC 6.8 4.0 - 10.5 K/uL   RBC 3.60 (L) 3.87 - 5.11 MIL/uL   Hemoglobin 10.5 (L) 12.0 - 15.0 g/dL   HCT 31.9 (L) 36.0 - 46.0 %   MCV 88.6 80.0 - 100.0 fL   MCH 29.2 26.0 - 34.0 pg   MCHC 32.9 30.0 - 36.0 g/dL   RDW 15.1 11.5 - 15.5 %   Platelets 386 150 - 400 K/uL   nRBC 0.0 0.0 - 0.2 %  Comprehensive metabolic panel  Result Value Ref Range   Sodium 140 135 - 145 mmol/L   Potassium 3.0 (L) 3.5 - 5.1 mmol/L   Chloride 105 98 - 111 mmol/L   CO2 21 (L) 22 - 32 mmol/L   Glucose, Bld 181 (H) 70 - 99 mg/dL   BUN <5 (L) 8 - 23 mg/dL   Creatinine, Ser 0.63 0.44 - 1.00 mg/dL   Calcium  9.1 8.9 - 10.3 mg/dL   Total Protein 8.2 (H) 6.5 - 8.1 g/dL   Albumin 3.2 (L) 3.5 - 5.0 g/dL   AST 30 15 - Lindsey U/L   ALT 30 0 - 44 U/L   Alkaline Phosphatase 75 38 - 126 U/L   Total Bilirubin 0.8 0.3 - 1.2 mg/dL   GFR, Estimated >60 >60 mL/min   Anion gap 14 5 - 15  Lipase, blood  Result Value Ref Range   Lipase 27 11 - 51 U/L  Urinalysis, Routine w reflex microscopic Urine, Catheterized  Result Value Ref Range   Color, Urine YELLOW YELLOW   APPearance CLEAR CLEAR   Specific Gravity, Urine 1.010 1.005 - 1.030   pH 6.0 5.0 - 8.0   Glucose, UA NEGATIVE NEGATIVE mg/dL   Hgb urine dipstick SMALL (A) NEGATIVE   Bilirubin Urine NEGATIVE NEGATIVE   Ketones, ur NEGATIVE NEGATIVE mg/dL   Protein, ur 30 (A) NEGATIVE mg/dL   Nitrite NEGATIVE NEGATIVE   Leukocytes,Ua NEGATIVE NEGATIVE  Urinalysis, Microscopic (reflex)  Result Value Ref Range   RBC / HPF NONE SEEN 0 - 5  RBC/hpf   WBC, UA 0-5 0 - 5 WBC/hpf   Bacteria, UA NONE SEEN NONE SEEN   Squamous Epithelial / LPF 0-5 0 - 5  POC CBG, ED  Result Value Ref Range   Glucose-Capillary 142 (H) 70 - 99 mg/dL  Troponin I (High Sensitivity)  Result Value Ref Range   Troponin I (High Sensitivity) 33 (H) <18 ng/L  Troponin I (High Sensitivity)  Result Value Ref Range   Troponin I (High Sensitivity) 42 (H) <18 ng/L   CT Head Wo Contrast  Result Date: 09/19/2021 CLINICAL DATA:  Altered behavior. Metal status change. Nausea with 1 episode of vomiting. EXAM: CT HEAD WITHOUT CONTRAST TECHNIQUE: Contiguous axial images were obtained from the base of the skull through the vertex without intravenous contrast. COMPARISON:  05/11/2020. FINDINGS: Brain: No evidence of acute infarction, hemorrhage, hydrocephalus, extra-axial collection or mass lesion/mass effect. Left MCA distribution infarct reflected by encephalomalacia with ex vacuo dilation of the left lateral ventricle, evolving since the prior head CT. Small hypoattenuating focus in the left  cerebellum consistent with a old lacunar infarct, new since the prior CT. Vascular: No hyperdense vessel or unexpected calcification. Skull: Normal. Negative for fracture or focal lesion. Sinuses/Orbits: Globes and orbits are unremarkable. Mild mucosal thickening in the left maxillary sinus. Posterior left ethmoid air cells mostly opacified by mucosal thickening. Remaining sinuses are clear. Other: None. IMPRESSION: 1. No acute intracranial abnormalities. 2. Old left MCA distribution infarct has evolved since the prior head CT. Small left cerebellar infarct, also old. Electronically Signed   By: Lajean Manes M.D.   On: 09/19/2021 12:28   CT ABDOMEN PELVIS W CONTRAST  Result Date: 09/19/2021 CLINICAL DATA:  Nausea and vomiting. EXAM: CT ABDOMEN AND PELVIS WITH CONTRAST TECHNIQUE: Multidetector CT imaging of the abdomen and pelvis was performed using the standard protocol following bolus administration of intravenous contrast. CONTRAST:  161mL OMNIPAQUE IOHEXOL 300 MG/ML  SOLN COMPARISON:  CT 08/17/2014 FINDINGS: Lower chest: No acute abnormality. Subsegmental atelectasis at the lung bases. Biatrial cardiac enlargement. Hepatobiliary: No focal liver abnormality is seen. A 0.9 cm hyperdensity is seen within the gallbladder with C-shaped configuration of the gallbladder body (series 3, images 24-25). No gallbladder wall thickening or pericholecystic fluid. No bile duct dilatation. Pancreas: Mild dilatation of the main pancreatic duct within the pancreatic head measuring up to 0.5 cm in diameter (series 3, image 29; series 6, image 30). The remainder of the pancreatic duct is normal caliber. No peripancreatic fat stranding or fluid collection. Spleen: Normal in size without focal abnormality. Adrenals/Urinary Tract: The adrenal glands are unremarkable. Focal hypoenhancement at the upper pole of the right kidney (series 3, image 24) as well as at the posterior and lateral aspects of the middle third of the right  kidney (series 3, image 29, 33). There is subtle fat stranding at the right hilum. Cortical scarring noted at the upper pole of the left kidney. A few simple cortical and parapelvic cysts are noted in the left kidney. No hydronephrosis or nephrolithiasis. Urinary bladder is unremarkable. Stomach/Bowel: Stomach is within normal limits. Appendix appears normal. Scattered diverticula of the transverse and descending colon. No evidence of bowel wall thickening, distention, or inflammatory changes. Vascular/Lymphatic: No significant vascular findings are present. An enlarged 1.0 cm short axis superior aortocaval lymph node is noted (series 3, image 20). No other enlarged lymph nodes in the abdomen or pelvis. 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 The adnexa are unremarkable. Other: No abdominal wall  hernia or abnormality. No abdominopelvic ascites. Musculoskeletal: No acute or significant osseous findings. IMPRESSION: 1. Multifocal pyelonephritis in the right kidney. No perinephric fluid collection. 2. An enlarged superior aortocaval lymph node is noted, likely reactive. Recommend attention on follow-up imaging. 3. Mild enlargement of the main pancreatic duct within the pancreatic head. When the patient is clinically stable and able to follow directions and hold their breath (preferably as an outpatient) further evaluation with dedicated abdominal MRI should be considered. 4. A 0.9 cm hyperdensity at the gallbladder body with associated C-shaped configuration of the gallbladder is worrisome for possible gallbladder wall mass. This can also be further evaluated on abdominal MRI. Electronically Signed   By: Sherron Ales M.D.   On: 09/19/2021 14:09   DG Chest Port 1 View  Result Date: 09/19/2021 CLINICAL DATA:  Altered behavior. Nausea with 1 episode of vomiting. EXAM: PORTABLE CHEST 1 VIEW COMPARISON:  None. FINDINGS: Cardiac silhouette mildly enlarged.  No mediastinal or hilar  masses. Clear lungs.  No pleural effusion.  No pneumothorax. Skeletal structures are grossly intact. IMPRESSION: No active disease. Electronically Signed   By: Amie Portland M.D.   On: 09/19/2021 12:24      Fayrene Helper, PA-C 09/19/21 1700    Cathren Laine, MD 09/19/21 [phone]

## 2021-09-19 NOTE — ED Notes (Signed)
Patient transported to MRI 

## 2021-09-19 NOTE — Hospital Course (Addendum)
12/19 feels better, still having abdominal pain, more alert today, denies CP, SOB. Discussed plan to discuss case with urology and continue abx to treat abscess, kidney infection.

## 2021-09-19 NOTE — ED Notes (Signed)
Pt tolerating PO fluids without emesis at this time. Plan to give potassium packets.

## 2021-09-19 NOTE — ED Notes (Signed)
Patient transported to Ultrasound 

## 2021-09-19 NOTE — ED Notes (Signed)
Oral meds given with applesauce

## 2021-09-19 NOTE — H&P (Addendum)
Date: 09/19/2021               Lindsey [name] Name:  Lindsey Lindsey [name] MRN: [id number]  DOB: 07/28/1956 Age / Sex: 65 y.o., female   PCP: Lindsey Morgan, MD         Medical Service: Internal Medicine Teaching Service         Attending Physician: Dr. Earl [name]    First Contact: Dr. Carlyn Lindsey [name] Pager: [phone]  Second Contact: Dr. 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  Lindsey Lindsey [name]'s daughter, primary caregiver, is present and provides Lindsey history. Lindsey Lindsey [name] is able to answer yes and no in response to questions, but cannot form phrases or sentences at this time. Lindsey daughter reports that Lindsey Lindsey [name]'s ability to communicate fluctuates depending on her mood and other factors. Lindsey daughter reports that Lindsey Lindsey [name] understands more than she is able to verbalize.   This morning Lindsey Lindsey [name] was dressed by Lindsey daughter and upon Lindsey daughter coming back a few minutes later, she found Lindsey Lindsey [name] diaphoretic and with slurred speech. She had an episode of vomiting a few tablespoons of clear liquid. Lindsey daughter was concerned for a stroke and called EMS. She reports Lindsey Lindsey [name] has vomited approximately 10x over Lindsey course of Lindsey day.   Lindsey daughter reports no odd or unusual symptoms leading up to today. She reports normal PO intake and denies fevers, chills, headache, sore throat, shortness of breath, chest pain, diarrhea, urinary changes, back pain, numbness/tingling. Lindsey Lindsey [name] may have lost some weight since her stroke, but no recent weight loss.   Meds:  Amlodipine 10 mg daily Lopressor 50 mg BID Eliquis 5 mg BID Atorvastatin 20  mg daily  Allergies: Allergies as of 09/19/2021   (No Known Allergies)   Family History:  Denies extensive FMHx of DM, stroke, CAD, cancer  Social History:  Lives with daughter (primary caregiver), grandson, and granddaughter. Walks with a cane at home, can usually walk around Lindsey home. Able to eat food independently but is dependent on daughter to prepare food. Does not drink alcohol, smoke cigarettes, or use illicit drugs.  Review of Systems: A complete ROS was negative except as per HPI.   Physical Exam: Blood pressure (!) 133/108, pulse 82, temperature (!) 96.6 F (35.9 C), temperature source Rectal, resp. rate 17, SpO2 100 %.  Physical Exam Vitals reviewed.  HENT:     Head: Normocephalic and atraumatic.     Mouth/Throat:     Mouth: Mucous membranes are moist.  Cardiovascular:     Rate and Rhythm: Normal rate. Rhythm irregular.     Pulses: Normal pulses.     Heart sounds: No murmur heard. Pulmonary:     Effort: Pulmonary effort is normal.     Breath sounds: Normal breath sounds.  Abdominal:     General: Bowel sounds are normal.     Palpations: Abdomen is soft.     Comments: RUQ pain on palpation  Musculoskeletal:     Comments: Bilateral contractures of Lindsey feet No CVA tenderness  Skin:    General: Skin is warm and dry.  Neurological:     Mental Status: She is alert.     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   #Vomiting, diaphoresis, malaise #Multifocal pyelonephritis (chronic?) Lindsey [name] with Lindsey above symptoms, with a bland UA was found to have multifocal pyelo on CT A/P. Lindsey Lindsey [name] has no leukocytosis or fever or tachycardia. No CVA tenderness on exam. This is unusual and makes me reconsider if pyelo is causing her symptoms. Uncertain etiology. Foodborne illness or gastroenteritis is possible, but Lindsey [name] has no diarrhea.  -Renal ultrasound to assess radiolucent stones and vesicoureteral reflux -F/u urine culture -F/u Bcx  -Continue Rocephin 1g daily x1 more dose -LR at 100 mL/hr x10 hrs -Dys 3 diet  #Elevated troponin Lindsey [name] without chest pain. Trops from 33 to 42 without EKG changes.  -repeat trops  #Gallbladder wall mass 0.9 cm mass on Lindsey gallbladder wall. -MRI to further characterize  #Atrial fibrillation #Hx L MCA stroke In rate controlled afib, HR around 70. CTH negative for acute stroke.  -Continue Eliquis 5 mg BID -Lopressor 50 mg BID -Atorvastatin 20 mg  #HTN -Home Amlodipine 10 mg  #Hypokalemia K of 3.0 -Oral Kcl 40 meq x1  #Chronic normocytic anemia Stable from last year, hgb at 10. Unknown last colonoscopy.    Dispo: Admit Lindsey [name] to Inpatient with expected length of stay greater than 2 midnights.  Signed: Corky Sox, MD PGY-1 Pager: [phone] After 5pm on weekdays and 1pm on weekends: On Call pager: [phone]

## 2021-09-19 NOTE — ED Notes (Signed)
Off-going RN and pt's daughter informed this RN that pt has been nauseous and experiencing emesis throughout the day. No PRN antiemetic currently available in chart. This RN provided pt with a cup of ginger ale to sip. If pt is able to tolerate PO fluids, plan to give scheduled 1930 klor-con. If pt experiences emesis from ginger ale, plan to communicate with provider the need for antiemetic before administration of due PO medications.

## 2021-09-20 DIAGNOSIS — N12 Tubulo-interstitial nephritis, not specified as acute or chronic: Secondary | ICD-10-CM

## 2021-09-20 LAB — CBC
HCT: 34.7 % — ABNORMAL LOW (ref 36.0–46.0)
Hemoglobin: 11.6 g/dL — ABNORMAL LOW (ref 12.0–15.0)
MCH: 29.4 pg (ref 26.0–34.0)
MCHC: 33.4 g/dL (ref 30.0–36.0)
MCV: 87.8 fL (ref 80.0–100.0)
Platelets: 381 10*3/uL (ref 150–400)
RBC: 3.95 MIL/uL (ref 3.87–5.11)
RDW: 15.2 % (ref 11.5–15.5)
WBC: 5.7 10*3/uL (ref 4.0–10.5)
nRBC: 0 % (ref 0.0–0.2)

## 2021-09-20 LAB — COMPREHENSIVE METABOLIC PANEL
ALT: 30 U/L (ref 0–44)
AST: 27 U/L (ref 15–41)
Albumin: 3.2 g/dL — ABNORMAL LOW (ref 3.5–5.0)
Alkaline Phosphatase: 78 U/L (ref 38–126)
Anion gap: 11 (ref 5–15)
BUN: <5 mg/dL — ABNORMAL LOW (ref 8–23)
CO2: 22 mmol/L (ref 22–32)
Calcium: 9.2 mg/dL (ref 8.9–10.3)
Chloride: 105 mmol/L (ref 98–111)
Creatinine, Ser: 0.58 mg/dL (ref 0.44–1.00)
GFR, Estimated: >60 mL/min (ref >60)
Glucose, Bld: 114 mg/dL — ABNORMAL HIGH (ref 70–99)
Potassium: 4.3 mmol/L (ref 3.5–5.1)
Sodium: 138 mmol/L (ref 135–145)
Total Bilirubin: 0.6 mg/dL (ref 0.3–1.2)
Total Protein: 8.1 g/dL (ref 6.5–8.1)

## 2021-09-20 LAB — PROTIME-INR
INR: 1.3 — ABNORMAL HIGH (ref 0.8–1.2)
Prothrombin Time: 15.8 seconds — ABNORMAL HIGH (ref 11.4–15.2)

## 2021-09-20 LAB — HIV ANTIBODY (ROUTINE TESTING W REFLEX): HIV Screen 4th Generation wRfx: NONREACTIVE

## 2021-09-20 MED ORDER — ONDANSETRON HCL 4 MG PO TABS
4.0000 mg | ORAL_TABLET | Freq: Two times a day (BID) | ORAL | Status: DC | PRN
  Administered 2021-09-24: 13:00:00 4 mg via ORAL
  Filled 2021-09-20: qty 1

## 2021-09-20 MED ORDER — SODIUM CHLORIDE 0.9 % IV SOLN
1.0000 g | INTRAVENOUS | Status: DC
  Administered 2021-09-21 – 2021-09-22 (×2): 1 g via INTRAVENOUS
  Filled 2021-09-20 (×2): qty 10

## 2021-09-20 NOTE — ED Notes (Signed)
Pt in hallway. Incontinent. Pt cleaned, brief placed and new sheets and gown provided. During interaction pt was able to follow commands but requires maximal assistance.

## 2021-09-20 NOTE — Progress Notes (Signed)
Date: 09/20/2021  Patient name: Lindsey [name]  Medical record number: [id number]  Date of birth: January 10, 1956   I have seen and evaluated Lindsey [name] and discussed their care with the Residency Team.  In brief: Patient is a 65 year old female with past medical history of left MCA stroke with resulting right-sided hemiplegia and difficulty speaking, A. fib and hypertension who presented to the ED with multiple episodes of nausea/vomiting as well as diaphoresis and malaise.  At baseline, patient is able to answer yes and no in response to questions and occasionally has short phrases that she says respond to questions but otherwise does not speak much.  Yesterday morning, patient was dressed by the daughter and when the daughter returned a few minutes later she noted the patient was diaphoretic, had slurred speech and had an episode of nausea and vomiting.  Vomitus was nonbloody and nonbilious.  Patient multiple episodesof nausea vomiting throughout the course of the day.  Daughter was concerned for recurrent stroke and called EMS.  No fevers or chills, no dysuria, no chest pain, no shortness of breath, no palpitations, no lightheadedness, no syncope, no change in focal weakness, no tingling or numbness, no lightheadedness, no syncope, no abdominal pain.  Today, patient states that she feels well and is back at her baseline per her daughter.  PMHx, Fam Hx, and/or Soc Hx : As per resident admit note  Vitals:   09/20/21 1016 09/20/21 1436  BP: (!) 145/91 120/74  Pulse: 76 88  Resp: 15 16  Temp: 97.7 F (36.5 C) 98 F (36.7 C)  SpO2: 97% 98%   General: Awake, alert, NAD CVS: Regular rate, irregular rhythm Lungs: CTA bilaterally Abdomen: Soft, nontender, nondistended, normoactive bowel sounds, no CVA tenderness noted Extremities: No edema noted, nontender to palpation Psych: Normal mood and affect HEENT: Normocephalic, atraumatic Skin: Warm and dry Neuro: Flaccid hemiparesis over the right  upper and lower extremities, patient only able to answer with 1 or 2 words in response to questions.  Strength in left upper extremity is 3 out of 5 and left lower extremity is 4 out of 5  Assessment and Plan: I have seen and evaluated the patient as outlined above. I agree with the formulated Assessment and Plan as detailed in the residents' note, with the following changes:   1.  Right-sided pyelonephritis with developing renal abscess: -Patient presented to ED with multiple episodes of nausea and vomiting and was noted to be diaphoretic by her daughter.  Patient has had no leukocytosis and no fevers but on imaging was noted to have right-sided pyelonephritis with developing renal abscess of 3.5 cm. -Patient remains afebrile with no leukocytosis -UA showed only small hemoglobin and no bacteria or white cells -Urine culture grew greater than 100,000 colonies of gram-negative rods.  Blood cultures are pending -Discussed with urology who recommended no surgical treatment for the abscess at this time.  They are uncertain if this is an actual abscess as patient is relatively asymptomatic and has no leukocytosis or fevers.  They will follow-up with her in 2 to 3 weeks as an outpatient -ID follow-up recommendations appreciated.  Since abscess is less than 5 cm this can be managed medically. -We will continue with IV Rocephin 1 g every 24 till sensitivities are back after which we will switch patient to oral antibiotics to complete a prolonged course -Patient will need repeat imaging to ensure resolution of abscess -Patient now tolerating oral intake.  Continue with dysphagia 3 diet -Continue Zofran as needed  for nausea -No further work-up at this time  Earl Lagos, MD 12/19/20222:48 PM

## 2021-09-20 NOTE — ED Notes (Signed)
Breakfast orders placed 

## 2021-09-20 NOTE — Progress Notes (Signed)
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   Overnight Events: NAEON  Interim History: On interview and assessment the patient is more alert and communicative. She is not lethargic. She produces spontaneous speech and appears well. Her daughter agrees. She reports some pain in the RUQ, but otherwise denies complaints.   OBJECTIVE:  Vital Signs: Vitals:   09/19/21 2130 09/20/21 0241 09/20/21 0600 09/20/21 1016  BP: 132/75 (!) 139/101 (!) 126/103 (!) 145/91  Pulse: 80 84 83 76  Resp: 16 19 17 15   Temp:  97.9 F (36.6 C) 98.4 F (36.9 C) 97.7 F (36.5 C)  TempSrc:  Oral Oral Oral  SpO2: 100% 100% 100% 97%     No intake or output data in the 24 hours ending 09/20/21 1228 Net IO Since Admission: -2 mL [09/20/21 1228]  Physical Exam: Physical Exam Vitals reviewed.  Cardiovascular:     Rate and Rhythm: Normal rate. Rhythm irregular.     Pulses: Normal pulses.     Heart sounds: No murmur heard. Pulmonary:     Effort: Pulmonary effort is normal.     Breath sounds: Normal breath sounds.  Abdominal:     General: Bowel sounds are normal.     Palpations: Abdomen is soft.     Tenderness: There is abdominal tenderness.  Musculoskeletal:     Comments: No CVA tenderness  Neurological:     Mental Status: She is alert.     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   #Vomiting, diaphoresis, malaise #Multifocal pyelonephritis (chronic?) MRI-Abdomen was notable for a 3.5 cm developing fluid collection on the upper pole of the right kidney, concern for abscess. Ucx growing 100K GNR. Patient appears well clinically and denies urinary symptoms. Urology consulted and will f/u as an outpatient. ID consulted for consideration of antibiotic choice and duration. Will continue with rocephin until cultures result and anticipate switch to cefdinir or similar agent.  -Continue Rocephin 1g daily x1 more dose -No more IVF -Dys 3 diet  #Atrial fibrillation #Hx L MCA stroke In rate controlled afib, HR around 70. CTH negative for acute stroke.  -Continue Eliquis 5 mg BID -Lopressor 50 mg BID -Atorvastatin 20 mg   #HTN -Home Amlodipine 10 mg   #Chronic normocytic anemia Stable from last year, hgb at 10. Unknown last colonoscopy.   #Elevated troponin Trops flat from 33 to 42 to 34. Patient denies CP.  -no further workup   #Gallbladder wall mass (resolved) 0.9 cm mass on the gallbladder wall turned out to  be a mucosal fold.  -No f/u needed  #Hypokalemia (resolved)   Dispo: Admit patient to Inpatient with expected length of stay greater than 2 midnights.  FEN/GI: dys 3 diet, no more IVF unless needed VTE: Eliquis Dispo: home, PT eval ordered Code Status: FULL  Signature: Corky Sox, MD PGY-1 Pager: [phone]  Please contact the on call pager after 5 pm and on weekends at [phone].

## 2021-09-20 NOTE — Consult Note (Signed)
[facility]  Total days of antibiotics 2 Reason for Consult:pyelonephritis/early renal abscess   Referring Physician: narenda  Principal Problem:   Pyelonephritis    HPI: Lindsey [name] is a 65 y.o. 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 She was found to have to some confusion per her daughter. The patient denies dysuria, frequent urination, nor any flank pain. No other members of the family are sick. She does not suffer from recurrent uti, nor nephrolithiasis. On admit, she underwent abd CT that showed multifocal pyelonephritis and abn gallbladder as incidental finding. She subsequently had abd mri which showed 3 x 3.5cm developing renal abscess ad normal GB. She was started on ceftriaxone, feeling slightly better no further vomiting. More coherent. Evaluated by urology - to follow up in the outpatient, no aspiration or drains needed at this time.  Past Medical History:  Diagnosis Date   Diverticulosis    Hypertension    Uterine prolapse     Allergies: No Known Allergies   MEDICATIONS:  amLODipine  5 mg Oral Daily   apixaban  5 mg Oral BID   atorvastatin  20 mg Oral q1800   metoprolol tartrate  50 mg Oral BID    Social History   Tobacco Use   Smoking status: Never   Smokeless tobacco: Never  Vaping Use   Vaping Use: Never used  Substance Use Topics   Alcohol use: Not Currently    Comment: states once a wine/beer occassionally    Drug use: No    Family History  Problem Relation Age of Onset   Breast cancer Neg Hx     Review of Systems -  Review of Systems  Constitutional: Negative for fever, chills, diaphoresis, activity change, appetite change, fatigue and unexpected weight change.  HENT: Negative for congestion, sore throat, rhinorrhea, sneezing, trouble swallowing and sinus pressure.  Eyes: Negative for photophobia  and visual disturbance.  Respiratory: Negative for cough, chest tightness, shortness of breath, wheezing and stridor.  Cardiovascular: Negative for chest pain, palpitations and leg swelling.  Gastrointestinal: positive for nausea, vomiting, abdominal pain, and negative for diarrhea, constipation, blood in stool, abdominal distention and anal bleeding.  Genitourinary: Negative for dysuria, hematuria, flank pain and difficulty urinating.  Musculoskeletal: Negative for myalgias, back pain, joint swelling, arthralgias and gait problem.  Skin: Negative for color change, pallor, rash and wound.  Neurological: Negative for dizziness, tremors, weakness and light-headedness.  Hematological: Negative for adenopathy. Does not bruise/bleed easily.  Psychiatric/Behavioral: Negative for behavioral problems, confusion, sleep disturbance, dysphoric mood, decreased concentration and agitation.     OBJECTIVE: Temp:  [97.7 F (36.5 C)-98.4 F (36.9 C)] 97.7 F (36.5 C) (12/19 1016) Pulse Rate:  [61-87] 76 (12/19 1016) Resp:  [15-23] 15 (12/19 1016) BP: (126-154)/(75-121) 145/91 (12/19 1016) SpO2:  [97 %-100 %] 97 % (12/19 1016) Physical Exam  Constitutional:  oriented to person, place, and time. appears well-developed and well-nourished. No distress.  HENT: [place]/AT, PERRLA, no scleral icterus.right sided facial droop.  Mouth/Throat: Oropharynx is clear and moist. No oropharyngeal exudate.  Cardiovascular: Normal rate, regular rhythm and normal heart sounds. Exam reveals no gallop and no friction rub.  No murmur heard.  Pulmonary/Chest: Effort normal and breath sounds normal. No respiratory distress.  has no wheezes.  Neck = supple, no nuchal rigidity Abdominal: Soft. Bowel sounds are normal.  exhibits no distension. There is no tenderness.  Lymphadenopathy: no cervical adenopathy. No axillary adenopathy Neurological: alert and oriented to person, place, and time. Dysarthritic speech. Has right sided  weakness Skin: Skin is warm and dry. No rash noted. No erythema.  Psychiatric: a normal mood and affect.  behavior is normal.    LABS: Results for orders placed or performed during the hospital encounter of 09/19/21 (from the past 48 hour(s))  CBC     Status: Abnormal   Collection Time: 09/19/21 11:39 AM  Result Value Ref Range   WBC 6.8 4.0 - 10.5 K/uL   RBC 3.60 (L) 3.87 - 5.11 MIL/uL   Hemoglobin 10.5 (L) 12.0 - 15.0 g/dL   HCT 31.9 (L) 36.0 - 46.0 %   MCV 88.6 80.0 - 100.0 fL   MCH 29.2 26.0 - 34.0 pg   MCHC 32.9 30.0 - 36.0 g/dL   RDW 15.1 11.5 - 15.5 %   Platelets 386 150 - 400 K/uL   nRBC 0.0 0.0 - 0.2 %    Comment: Performed at [facility], [place] [address], [place], [place] [address]  Comprehensive metabolic panel     Status: Abnormal   Collection Time: 09/19/21 11:39 AM  Result Value Ref Range   Sodium 140 135 - 145 mmol/L   Potassium 3.0 (L) 3.5 - 5.1 mmol/L   Chloride 105 98 - 111 mmol/L   CO2 21 (L) 22 - 32 mmol/L   Glucose, Bld 181 (H) 70 - 99 mg/dL    Comment: Glucose reference range applies only to samples taken after fasting for at least 8 hours.   BUN <5 (L) 8 - 23 mg/dL   Creatinine, Ser 0.63 0.44 - 1.00 mg/dL   Calcium 9.1 8.9 - 10.3 mg/dL   Total Protein 8.2 (H) 6.5 - 8.1 g/dL   Albumin 3.2 (L) 3.5 - 5.0 g/dL   AST 30 15 - 41 U/L   ALT 30 0 - 44 U/L   Alkaline Phosphatase 75 38 - 126 U/L   Total Bilirubin 0.8 0.3 - 1.2 mg/dL   GFR, Estimated >60 >60 mL/min    Comment: (NOTE) Calculated using the CKD-EPI Creatinine Equation (2021)    Anion gap 14 5 - 15    Comment: Performed at [facility], [place] [address], [place], [place] [address]  Lipase, blood     Status: None   Collection Time: 09/19/21 11:39 AM  Result Value Ref Range   Lipase 27 11 - 51 U/L    Comment: Performed at [facility] [address] [place] [address] [place], [place] [address]  Troponin I (High Sensitivity)     Status: Abnormal   Collection Time: 09/19/21 11:39 AM  Result  Value Ref Range   Troponin I (High Sensitivity) 33 (H) <18 ng/L    Comment: (NOTE) Elevated high sensitivity troponin I (hsTnI) values and significant  changes across serial measurements may suggest ACS but many other  chronic and acute conditions are known to elevate hsTnI results.  Refer to the Links section for chest pain algorithms and additional  guidance. Performed at [facility], [place] [address], [place], [place] [address]   Urinalysis, Routine w reflex microscopic Urine, Catheterized     Status: Abnormal   Collection Time: 09/19/21 11:40 AM  Result Value Ref Range   Color, Urine YELLOW YELLOW   APPearance CLEAR CLEAR   Specific Gravity, Urine 1.010 1.005 - 1.030   pH 6.0 5.0 - 8.0  Glucose, UA NEGATIVE NEGATIVE mg/dL   Hgb urine dipstick SMALL (A) NEGATIVE   Bilirubin Urine NEGATIVE NEGATIVE   Ketones, ur NEGATIVE NEGATIVE mg/dL   Protein, ur 30 (A) NEGATIVE mg/dL   Nitrite NEGATIVE NEGATIVE   Leukocytes,Ua NEGATIVE NEGATIVE    Comment: Performed at [facility] [address], [place], [place] [address]  Resp Panel by RT-PCR (Flu A&B, Covid) Nasopharyngeal Swab     Status: None   Collection Time: 09/19/21 11:40 AM   Specimen: Nasopharyngeal Swab; Nasopharyngeal(NP) swabs in vial transport medium  Result Value Ref Range   SARS Coronavirus 2 by RT PCR NEGATIVE NEGATIVE    Comment: (NOTE) SARS-CoV-2 target nucleic acids are NOT DETECTED.  The SARS-CoV-2 RNA is generally detectable in upper respiratory specimens during the acute phase of infection. The lowest concentration of SARS-CoV-2 viral copies this assay can detect is 138 copies/mL. A negative result does not preclude SARS-Cov-2 infection and should not be used as the sole basis for treatment or other patient management decisions. A negative result may occur with  improper specimen collection/handling, submission of specimen other than nasopharyngeal swab, presence of viral mutation(s) within  the areas targeted by this assay, and inadequate number of viral copies(<138 copies/mL). A negative result must be combined with clinical observations, patient history, and epidemiological information. The expected result is Negative.  Fact Sheet for Patients:  EntrepreneurPulse.com.au  Fact Sheet for Healthcare Providers:  IncredibleEmployment.be  This test is no t yet approved or cleared by the [place] FDA and  has been authorized for detection and/or diagnosis of SARS-CoV-2 by FDA under an Emergency Use Authorization (EUA). This EUA will remain  in effect (meaning this test can be used) for the duration of the COVID-19 declaration under Section 564(b)(1) of the Act, 21 U.S.C.section 360bbb-3(b)(1), unless the authorization is terminated  or revoked sooner.       Influenza A by PCR NEGATIVE NEGATIVE   Influenza B by PCR NEGATIVE NEGATIVE    Comment: (NOTE) The Xpert Xpress SARS-CoV-2/FLU/RSV plus assay is intended as an aid in the diagnosis of influenza from Nasopharyngeal swab specimens and should not be used as a sole basis for treatment. Nasal washings and aspirates are unacceptable for Xpert Xpress SARS-CoV-2/FLU/RSV testing.  Fact Sheet for Patients: EntrepreneurPulse.com.au  Fact Sheet for Healthcare Providers: IncredibleEmployment.be  This test is not yet approved or cleared by the [place] FDA and has been authorized for detection and/or diagnosis of SARS-CoV-2 by FDA under an Emergency Use Authorization (EUA). This EUA will remain in effect (meaning this test can be used) for the duration of the COVID-19 declaration under Section 564(b)(1) of the Act, 21 U.S.C. section 360bbb-3(b)(1), unless the authorization is terminated or revoked.  Performed at [facility], [place] [address] [place], [place] [address]   Urinalysis, Microscopic (reflex)     Status: None   Collection  Time: 09/19/21 11:40 AM  Result Value Ref Range   RBC / HPF NONE SEEN 0 - 5 RBC/hpf   WBC, UA 0-5 0 - 5 WBC/hpf   Bacteria, UA NONE SEEN NONE SEEN   Squamous Epithelial / LPF 0-5 0 - 5    Comment: Performed at [facility], [place] [address], [place], [place] [address]  POC CBG, ED     Status: Abnormal   Collection Time: 09/19/21 12:42 PM  Result Value Ref Range   Glucose-Capillary 142 (H) 70 - 99 mg/dL    Comment: Glucose reference range applies only  to samples taken after fasting for at least 8 hours.  Troponin I (High Sensitivity)     Status: Abnormal   Collection Time: 09/19/21  2:03 PM  Result Value Ref Range   Troponin I (High Sensitivity) 42 (H) <18 ng/L    Comment: (NOTE) Elevated high sensitivity troponin I (hsTnI) values and significant  changes across serial measurements may suggest ACS but many other  chronic and acute conditions are known to elevate hsTnI results.  Refer to the "Links" section for chest pain algorithms and additional  guidance. Performed at [facility], [address]  [place], [place] [address]   Urine Culture     Status: Abnormal (Preliminary result)   Collection Time: 09/19/21  2:21 PM   Specimen: Urine, Clean Catch  Result Value Ref Range   Specimen Description URINE, CLEAN CATCH    Special Requests NONE    Culture (A)     >=100,000 COLONIES/mL GRAM NEGATIVE RODS CULTURE REINCUBATED FOR BETTER GROWTH Performed at [facility], [address] [place], [place] [address]    Report Status PENDING   Troponin I (High Sensitivity)     Status: Abnormal   Collection Time: 09/19/21  7:28 PM  Result Value Ref Range   Troponin I (High Sensitivity) 34 (H) <18 ng/L    Comment: (NOTE) Elevated high sensitivity troponin I (hsTnI) values and significant  changes across serial measurements may suggest ACS but many other  chronic and acute conditions are known to elevate hsTnI results.  Refer to the "Links" section for chest pain algorithms  and additional  guidance. Performed at [facility], [address] [place], [place] [address]   HIV Antibody (routine testing w rflx)     Status: None   Collection Time: 09/20/21  3:05 AM  Result Value Ref Range   HIV Screen 4th Generation wRfx Non Reactive Non Reactive    Comment: Performed at [facility], [address] [place], [place] [address]  CBC     Status: Abnormal   Collection Time: 09/20/21  3:05 AM  Result Value Ref Range   WBC 5.7 4.0 - 10.5 K/uL   RBC 3.95 3.87 - 5.11 MIL/uL   Hemoglobin 11.6 (L) 12.0 - 15.0 g/dL   HCT 41.634.7 (L) 60.636.0 - 30.146.0 %   MCV 87.8 80.0 - 100.0 fL   MCH 29.4 26.0 - 34.0 pg   MCHC 33.4 30.0 - 36.0 g/dL   RDW 60.115.2 09.311.5 - 23.515.5 %   Platelets 381 150 - 400 K/uL   nRBC 0.0 0.0 - 0.2 %    Comment: Performed at [facility], [address] [place], [place] [address]  Comprehensive metabolic panel     Status: Abnormal   Collection Time: 09/20/21  3:05 AM  Result Value Ref Range   Sodium 138 135 - 145 mmol/L   Potassium 4.3 3.5 - 5.1 mmol/L    Comment: DELTA CHECK NOTED   Chloride 105 98 - 111 mmol/L   CO2 22 22 - 32 mmol/L   Glucose, Bld 114 (H) 70 - 99 mg/dL    Comment: Glucose reference range applies only to samples taken after fasting for at least 8 hours.   BUN <5 (L) 8 - 23 mg/dL   Creatinine, Ser 0.250.58 0.44 - 1.00 mg/dL   Calcium 9.2 8.9 - 42.710.3 mg/dL   Total Protein 8.1 6.5 - 8.1 g/dL   Albumin 3.2 (L) 3.5 - 5.0 g/dL  AST 27 15 - 41 U/L   ALT 30 0 - 44 U/L   Alkaline Phosphatase 78 38 - 126 U/L   Total Bilirubin 0.6 0.3 - 1.2 mg/dL   GFR, Estimated >60 >60 mL/min    Comment: (NOTE) Calculated using the CKD-EPI Creatinine Equation (2021)    Anion gap 11 5 - 15    Comment: Performed at [facility] [address] [place], [place] [address]  Protime-INR     Status: Abnormal   Collection Time: 09/20/21  3:05 AM  Result Value Ref Range   Prothrombin Time 15.8 (H) 11.4 - 15.2 seconds   INR 1.3 (H) 0.8 - 1.2     Comment: (NOTE) INR goal varies based on device and disease states. Performed at [facility], [place] [address] [place], [place] [address]     MICRO: Urine cx with gnr No blood cx taken IMAGING: CT Head Wo Contrast  Result Date: 09/19/2021 CLINICAL DATA:  Altered behavior. Metal status change. Nausea with 1 episode of vomiting. EXAM: CT HEAD WITHOUT CONTRAST TECHNIQUE: Contiguous axial images were obtained from the base of the skull through the vertex without intravenous contrast. COMPARISON:  05/11/2020. FINDINGS: Brain: No evidence of acute infarction, hemorrhage, hydrocephalus, extra-axial collection or mass lesion/mass effect. Left MCA distribution infarct reflected by encephalomalacia with ex vacuo dilation of the left lateral ventricle, evolving since the prior head CT. Small hypoattenuating focus in the left cerebellum consistent with a old lacunar infarct, new since the prior CT. Vascular: No hyperdense vessel or unexpected calcification. Skull: Normal. Negative for fracture or focal lesion. Sinuses/Orbits: Globes and orbits are unremarkable. Mild mucosal thickening in the left maxillary sinus. Posterior left ethmoid air cells mostly opacified by mucosal thickening. Remaining sinuses are clear. Other: None. IMPRESSION: 1. No acute intracranial abnormalities. 2. Old left MCA distribution infarct has evolved since the prior head CT. Small left cerebellar infarct, also old. Electronically Signed   By: Lajean Manes M.D.   On: 09/19/2021 12:28   CT ABDOMEN PELVIS W CONTRAST  Result Date: 09/19/2021 CLINICAL DATA:  Nausea and vomiting. EXAM: CT ABDOMEN AND PELVIS WITH CONTRAST TECHNIQUE: Multidetector CT imaging of the abdomen and pelvis was performed using the standard protocol following bolus administration of intravenous contrast. CONTRAST:  175mL OMNIPAQUE IOHEXOL 300 MG/ML  SOLN COMPARISON:  CT 08/17/2014 FINDINGS: Lower chest: No acute abnormality. Subsegmental atelectasis at the  lung bases. Biatrial cardiac enlargement. Hepatobiliary: No focal liver abnormality is seen. A 0.9 cm hyperdensity is seen within the gallbladder with C-shaped configuration of the gallbladder body (series 3, images 24-25). No gallbladder wall thickening or pericholecystic fluid. No bile duct dilatation. Pancreas: Mild dilatation of the main pancreatic duct within the pancreatic head measuring up to 0.5 cm in diameter (series 3, image 29; series 6, image 30). The remainder of the pancreatic duct is normal caliber. No peripancreatic fat stranding or fluid collection. Spleen: Normal in size without focal abnormality. Adrenals/Urinary Tract: The adrenal glands are unremarkable. Focal hypoenhancement at the upper pole of the right kidney (series 3, image 24) as well as at the posterior and lateral aspects of the middle third of the right kidney (series 3, image 29, 33). There is subtle fat stranding at the right hilum. Cortical scarring noted at the upper pole of the left kidney. A few simple cortical and parapelvic cysts are noted in the left kidney. No hydronephrosis or nephrolithiasis. Urinary bladder is unremarkable. Stomach/Bowel: Stomach is within normal limits. Appendix appears normal.  Scattered diverticula of the transverse and descending colon. No evidence of bowel wall thickening, distention, or inflammatory changes. Vascular/Lymphatic: No significant vascular findings are present. An enlarged 1.0 cm short axis superior aortocaval lymph node is noted (series 3, image 20). No other enlarged lymph nodes in the abdomen or pelvis. 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 The adnexa are unremarkable. Other: No abdominal wall hernia or abnormality. No abdominopelvic ascites. Musculoskeletal: No acute or significant osseous findings. IMPRESSION: 1. Multifocal pyelonephritis in the right kidney. No perinephric fluid collection. 2. An enlarged superior aortocaval lymph node is  noted, likely reactive. Recommend attention on follow-up imaging. 3. Mild enlargement of the main pancreatic duct within the pancreatic head. When the patient is clinically stable and able to follow directions and hold their breath (preferably as an outpatient) further evaluation with dedicated abdominal MRI should be considered. 4. A 0.9 cm hyperdensity at the gallbladder body with associated C-shaped configuration of the gallbladder is worrisome for possible gallbladder wall mass. This can also be further evaluated on abdominal MRI. Electronically Signed   By: Ileana Roup M.D.   On: 09/19/2021 14:09   US RENAL  Result Date: 09/19/2021 CLINICAL DATA:  Pyelonephritis EXAM: RENAL / URINARY TRACT ULTRASOUND COMPLETE COMPARISON:  None. FINDINGS: Right Kidney: Renal measurements: 10.1 x 4.2 x 5.0 cm = volume: 110 mL. Echogenicity within normal limits. No mass or hydronephrosis visualized. No perinephric fluid collection identified. No intrarenal calcifications are seen. Left Kidney: Renal measurements: 11.6 x 5.1 x 5.2 cm = volume: 160 mL. Echogenicity within normal limits. No mass or hydronephrosis visualized. No intrarenal calcifications. No perinephric fluid collections. 2 cm simple cortical cyst noted within the upper pole. Bladder: Appears normal for degree of bladder distention. Other: None. IMPRESSION: Normal renal sonogram. Parenchymal changes noted on MRI examination are not well appreciated on this exam. Electronically Signed   By: Fidela Salisbury M.D.   On: 09/19/2021 22:45   DG Chest Port 1 View  Result Date: 09/19/2021 CLINICAL DATA:  Altered behavior. Nausea with 1 episode of vomiting. EXAM: PORTABLE CHEST 1 VIEW COMPARISON:  None. FINDINGS: Cardiac silhouette mildly enlarged.  No mediastinal or hilar masses. Clear lungs.  No pleural effusion.  No pneumothorax. Skeletal structures are grossly intact. IMPRESSION: No active disease. Electronically Signed   By: Lajean Manes M.D.   On: 09/19/2021  12:24   MR ABDOMEN MRCP W WO CONTAST  Result Date: 09/19/2021 CLINICAL DATA:  Gallbladder mass, pyelonephritis, right upper quadrant pain EXAM: MRI ABDOMEN WITHOUT AND WITH CONTRAST (INCLUDING MRCP) TECHNIQUE: Multiplanar multisequence MR imaging of the abdomen was performed both before and after the administration of intravenous contrast. Heavily T2-weighted images of the biliary and pancreatic ducts were obtained, and three-dimensional MRCP images were rendered by post processing. CONTRAST:  93mL GADAVIST GADOBUTROL 1 MMOL/ML IV SOLN COMPARISON:  CT 09/19/2021 FINDINGS: Lower chest: Mild cardiomegaly.  Small hiatal hernia. Hepatobiliary: Hepatic parenchymal signal characteristics are within normal limits. No intrahepatic mass identified. No intra or extrahepatic biliary ductal dilation. No choledocholithiasis. The gallbladder is unremarkable. Previously noted possible gallbladder wall mass simply represents a mucosal fold secondary to a Phrygian cap. Pancreas:  Unremarkable.  No pancreatic divisum. Spleen:  Unremarkable Adrenals/Urinary Tract: The adrenal glands are unremarkable. The kidneys are incompletely included on this examination with exclusion of the lower poles bilaterally. Multiple simple cortical cysts are seen within the left kidney. There are segmental regions of decreased T2 signal intensity demonstrating relative hypoenhancement on postcontrast images in  keeping with changes of pyelonephritis within the right kidney. Additionally, within the upper pole is a more focal region measuring 3.0 x 3.5 cm demonstrating a small amount of adjacent perinephric edema, intrinsic markedly decreased T2 signal intensity with marginal restricted diffusion and heterogeneous enhancement areas of central non enhancement most in keeping with a developing renal abscess. No hydronephrosis. No loculated perinephric fluid collections. Stomach/Bowel: Visualized portions within the abdomen are unremarkable.  Vascular/Lymphatic: The abdominal vasculature is unremarkable. Shotty para caval lymph node is again seen measuring 8 mm in short axis diameter, possibly reactive in nature. No frankly pathologic adenopathy within the abdomen and pelvis. Other:  None. Musculoskeletal: No suspicious bone lesions identified. IMPRESSION: Normal examination of the gallbladder. Questionable mass simply represents mucosal fold. Heterogeneous enhancement of the right kidney in keeping with changes of pyelonephritis. Superimposed more focal 3.5 cm collection within the upper pole the right kidney in keeping with a developing renal abscess. No discrete drainable fluid component identified at this time. Follow-up evaluation following conservative therapy is recommended to document resolution. Shotty pericaval lymph node, possibly reactive in nature. Mild cardiomegaly. Small hiatal hernia. Electronically Signed   By: Helyn Numbers M.D.   On: 09/19/2021 21:57    Assessment/Plan:  65yo F with hx of L MCA stroke, HTN, with right sided weakness and dysarthria admitted for nausea and vomiting and found to have pyelonephritis and 3x 3.5cm developing right renal abscess - continue on ceftriaxone 1gm daily until urine cx results identified so that abtx can be narrowed and likely converted to oral abtx. - likely will need prolonged course due to renal abscess and follow up imaging. With renal abscess <5cm, can be able to treat with abtx alone successfully. - will follow up on WBC and fever curve  Dysarthria and right sided weakness =appears to be at her baseline

## 2021-09-20 NOTE — Progress Notes (Signed)
I was called regarding Lindsey [name]'s imaging.  She has a 3cm lesion in the RUP with a concern for renal abscess but she had no flank pain and her UA is clear.  She has no leukocytosis.     I have reviewed the films and have recommended that ID be contacted about antibiotic duration, but I am not sure this is an abscess based on the symptoms and lab findings.     I have let my office know that she need to be seen in f/u in the office in 2-3 weeks and may need repeat Imaging to reassess the lesion.

## 2021-09-21 ENCOUNTER — Ambulatory Visit: Payer: Medicaid Other | Admitting: Adult Health

## 2021-09-21 LAB — BASIC METABOLIC PANEL
Anion gap: 9 (ref 5–15)
BUN: 6 mg/dL — ABNORMAL LOW (ref 8–23)
CO2: 26 mmol/L (ref 22–32)
Calcium: 9.3 mg/dL (ref 8.9–10.3)
Chloride: 104 mmol/L (ref 98–111)
Creatinine, Ser: 0.69 mg/dL (ref 0.44–1.00)
GFR, Estimated: >60 mL/min (ref >60)
Glucose, Bld: 97 mg/dL (ref 70–99)
Potassium: 3.8 mmol/L (ref 3.5–5.1)
Sodium: 139 mmol/L (ref 135–145)

## 2021-09-21 LAB — CBC
HCT: 31.7 % — ABNORMAL LOW (ref 36.0–46.0)
Hemoglobin: 10.9 g/dL — ABNORMAL LOW (ref 12.0–15.0)
MCH: 29.5 pg (ref 26.0–34.0)
MCHC: 34.4 g/dL (ref 30.0–36.0)
MCV: 85.7 fL (ref 80.0–100.0)
Platelets: 373 10*3/uL (ref 150–400)
RBC: 3.7 MIL/uL — ABNORMAL LOW (ref 3.87–5.11)
RDW: 15 % (ref 11.5–15.5)
WBC: 5.2 10*3/uL (ref 4.0–10.5)
nRBC: 0 % (ref 0.0–0.2)

## 2021-09-21 NOTE — Progress Notes (Signed)
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   Overnight Events: NAEON  Interim History: On interview and assessment the patient is alert and attempts to communicate. She is dysarthric and only able to say a few words. She endorses some pain, likely her ABD pain from yesterday.   OBJECTIVE:  Vital Signs: Vitals:   09/20/21 2150 09/21/21 0000 09/21/21 0453 09/21/21 0902  BP: 115/69 114/78 (!) 143/93 119/75  Pulse: 62 82 65 88  Resp: 18 18 18 19   Temp: 99.1 F (37.3 C) 97.9 F (36.6 C) 98.5 F (36.9 C) 98.1 F (36.7 C)  TempSrc: Oral Oral  Oral  SpO2: 100% 100% 100% 100%      Intake/Output Summary (Last 24 hours) at 09/21/2021 1348 Last data filed at 09/21/2021 0900 Gross per 24 hour  Intake 460 ml  Output --  Net 460 ml   Net IO Since Admission: 458 mL [09/21/21 1348]  Physical Exam: Physical Exam Vitals reviewed.  Cardiovascular:     Rate and Rhythm: Normal rate. Rhythm irregular.     Pulses: Normal pulses.     Heart sounds: No murmur heard. Pulmonary:     Effort: Pulmonary effort is normal.     Breath sounds: Normal breath sounds.  Abdominal:     General: Bowel sounds are normal.     Palpations: Abdomen is soft.     Tenderness: There is abdominal tenderness.  Neurological:     Mental Status: She is alert.    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   #Vomiting, diaphoresis, malaise #Multifocal pyelonephritis (chronic?) MRI-Abdomen was notable for a 3.5 cm developing fluid collection on the upper pole of the right kidney, concern for abscess. Ucx growing 100K GNR. Patient appears well clinically and denies urinary symptoms. Urology consulted and will f/u as an outpatient. ID consulted for consideration of antibiotic choice and duration. Cultures re-incubated for better growth. Expect some delay with sensitivities.  -Continue Rocephin 1g daily until sensitivities result -Dys 3 diet -PT/OT eval for imminent discharge  #Atrial fibrillation #Hx L MCA stroke In rate controlled afib, HR around 70. CTH negative for acute stroke.  -Continue Eliquis 5 mg BID -Lopressor 50 mg BID -Atorvastatin 20 mg   #HTN -Home Amlodipine 10 mg   #Chronic normocytic anemia Stable from last year, hgb at 10. Unknown last colonoscopy.   #Elevated troponin Trops flat from 33 to 42 to 34. Patient denies CP.  -no further workup   #Gallbladder wall mass (resolved) 0.9 cm mass on the gallbladder wall turned out to be a mucosal fold.  -No f/u needed  #Hypokalemia (resolved)  Dispo: Admit patient to Inpatient with expected length of stay greater than 2 midnights.  FEN/GI: dys 3 diet, no more IVF unless needed VTE: Eliquis Dispo: home, PT eval ordered Code Status: FULL  Signature: Lindsey Sox, MD PGY-1 Pager: [phone]  Please contact the on call pager after 5 pm and on weekends at [phone].

## 2021-09-21 NOTE — Evaluation (Signed)
Physical Therapy Evaluation Patient Details Name: Lindsey [name] MRN: [id number] DOB: 01/13/1956 Today's Date: 09/21/2021  History of Present Illness  65 y.o. female presents to [facility] hospital on 09/19/2021 with nausea/vomiting, diaphoresis, and malaise. Pt admitted for management of R sided pyelonephritis with developing renal abscess. 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  Clinical Impression  Pt presents to PT with deficits in gait, balance, functional mobility, strength, power, endurance, tone, communication. Pt with R hemiplegia from previous CVA, daughter reports R knee buckling is common at baseline when not utilizing R knee brace. Pt requires assistance to stand and pivot to recliner today with R knee buckling noted. Pt's daughter also reports impaired communication compared to baseline, with the pt being able to speak in more complete sentences rather than just yes/no. Pt will benefit from continued aggressive mobilization and attempts at gait assessment with use of knee brace next session. Pt has 24/7 assistance and daughter reports being able to physically assist the pt when needed. Family prefers outpatient PT services at the time of discharge.       Recommendations for follow up therapy are one component of a multi-disciplinary discharge planning process, led by the attending physician.  Recommendations may be updated based on patient status, additional functional criteria and insurance authorization.  Follow Up Recommendations Outpatient PT (daughter prefers outpatient PT to home health)    Assistance Recommended at Discharge Intermittent Supervision/Assistance  Functional Status Assessment Patient has had a recent decline in their functional status and demonstrates the ability to make significant improvements in function in a reasonable and predictable amount of time.  Equipment Recommendations  None recommended by PT     Recommendations for Other Services       Precautions / Restrictions Precautions Precautions: Fall Precaution Comments: R hemiplegia and global aphasia Required Braces or Orthoses:  (pt wears knee brace at baseline when ambulating due to R knee buckling) Restrictions Weight Bearing Restrictions: No      Mobility  Bed Mobility Overal bed mobility: Needs Assistance Bed Mobility: Supine to Sit     Supine to sit: Mod assist;HOB elevated     General bed mobility comments: modA to progress trunk to uprigh posture    Transfers Overall transfer level: Needs assistance Equipment used: 2 person hand held assist Transfers: Sit to/from Stand;Bed to chair/wheelchair/BSC Sit to Stand: Min assist;+2 physical assistance Stand pivot transfers: Mod assist Step pivot transfers: Mod assist       General transfer comment: minA+2 for sit<>stand transfer;modA for stand pivot transfer with 2 person hand held assistance. knee brace not utilized during transfer. Daughter reports pt will complete stand-pivot transfers without knee brace at baseline, but always has knee brace on with ambulation    Ambulation/Gait Ambulation/Gait assistance: Mod assist;+2 physical assistance Gait Distance (Feet): 2 Feet Assistive device: Rolling walker (2 wheels) Gait Pattern/deviations: Step-to pattern Gait velocity: reduced Gait velocity interpretation: <1.31 ft/sec, indicative of household ambulator   General Gait Details: pt with R knee buckling, unable to grasp walker with RUE. further gait deferred due to high falls risk. Unaware of knee brace until contacting pt's daughter after session  Stairs            Wheelchair Mobility    Modified Rankin (Stroke Patients Only)       Balance Overall balance assessment: Needs assistance Sitting-balance support: No upper extremity supported;Feet supported Sitting balance-Leahy Scale: Fair Sitting balance - Comments: did not challenge balance  Standing balance support: Single extremity supported Standing balance-Leahy Scale: Poor Standing balance comment: min-modA with LUE support of walker or hand hold                             Pertinent Vitals/Pain Pain Assessment: No/denies pain Pain Intervention(s): Limited activity within patient's tolerance;Monitored during session    [place] expects to be discharged to:: Private residence Living Arrangements: Children Available Help at Discharge: Family Type of Home: House Home Access: Level entry     Alternate Level Stairs-Number of Steps: flight, chair lift Home Layout: Multi-level;Able to live on main level with bedroom/bathroom;1/2 bath on main level Home Equipment: Shower seat;Wheelchair - manual;Cane - single Barista (2 wheels);Other (comment);Hospital bed Additional Comments: Daughter provided PLOF and home living information via phone conversation; daughter reports since sunday pt has had increased food coming out of right side of mouth when eating, difficulty with word finding/slurred speech/unintelligible speech and pt does not appear as "sharp". pt baseline converses more than yes/no, speaking in more complete sentences    Prior Function Prior Level of Function : Needs assist  Cognitive Assist : ADLs (cognitive)   ADLs (Cognitive): Intermittent cues Physical Assist : Mobility (physical);ADLs (physical) Mobility (physical): Gait;Stairs ADLs (physical): Grooming;Bathing;Dressing;Toileting;IADLs Mobility Comments: pt ambulates with a cane and use of knee brace, requires assistance on carpeting due to reduced R foot clearance ADLs Comments: needs assistance with bathing, dressing, grooming, setup for feeding     Hand Dominance   Dominant Hand: Right    Extremity/Trunk Assessment   Upper Extremity Assessment Upper Extremity Assessment: Defer to OT evaluation RUE Deficits / Details: hx of hemiplegia from previous CVA. Pt  with flexion contracture. LUE Deficits / Details: overall WFL    Lower Extremity Assessment Lower Extremity Assessment: RLE deficits/detail RLE Deficits / Details: 3+/5 knee extension, 3/5 PF/DF    Cervical / Trunk Assessment Cervical / Trunk Assessment: Normal  Communication   Communication: Expressive difficulties;Receptive difficulties  Cognition Arousal/Alertness: Awake/alert Behavior During Therapy: WFL for tasks assessed/performed Overall Cognitive Status: Difficult to assess Area of Impairment: Following commands                       Following Commands: Follows one step commands consistently     Problem Solving: Slow processing;Decreased initiation;Requires verbal cues;Requires tactile cues General Comments: pt's daughter reports she is not at sharp since sunday, reports it feels like pt is looking through her/staring off into space, daughter noted change in speech (difficulty word finding and intelligible speech. at baseline pt talking in more sentence like structure than yes/no. At baseline pt marks off days on calendar, unsure orientation at this time.        General Comments General comments (skin integrity, edema, etc.): VSS on RA, pt is unable to identify nurse call button, PT reinforces education    Exercises     Assessment/Plan    PT Assessment Patient needs continued PT services  PT Problem List Decreased strength;Decreased activity tolerance;Decreased balance;Decreased mobility;Decreased range of motion;Decreased cognition;Decreased knowledge of use of DME;Decreased safety awareness;Decreased knowledge of precautions;Impaired tone       PT Treatment Interventions DME instruction;Gait training;Functional mobility training;Therapeutic activities;Therapeutic exercise;Balance training;Patient/family education;Wheelchair mobility training;Neuromuscular re-education;Cognitive remediation    PT Goals (Current goals can be found in the Care Plan section)   Acute Rehab PT Goals Patient Stated Goal: to return to prior level of function PT Goal Formulation: With family  Time For Goal Achievement: 10/05/21 Potential to Achieve Goals: Fair    Frequency Min 3X/week   Barriers to discharge        Co-evaluation PT/OT/SLP Co-Evaluation/Treatment: Yes Reason for Co-Treatment: Complexity of the patient's impairments (multi-system involvement);Necessary to address cognition/behavior during functional activity;For patient/therapist safety;To address functional/ADL transfers PT goals addressed during session: Mobility/safety with mobility;Balance;Strengthening/ROM;Proper use of DME OT goals addressed during session: ADL's and self-care       AM-PAC PT "6 Clicks" Mobility  Outcome Measure Help needed turning from your back to your side while in a flat bed without using bedrails?: A Lot Help needed moving from lying on your back to sitting on the side of a flat bed without using bedrails?: A Lot Help needed moving to and from a bed to a chair (including a wheelchair)?: A Lot Help needed standing up from a chair using your arms (e.g., wheelchair or bedside chair)?: A Lot Help needed to walk in hospital room?: Total Help needed climbing 3-5 steps with a railing? : Total 6 Click Score: 10    End of Session   Activity Tolerance: Patient tolerated treatment well Patient left: in chair;with call bell/phone within reach;with chair alarm set (belt alarm in place) Nurse Communication: Mobility status PT Visit Diagnosis: Other abnormalities of gait and mobility (R26.89);Muscle weakness (generalized) (M62.81);Other symptoms and signs involving the nervous system (R29.898);Hemiplegia and hemiparesis Hemiplegia - Right/Left: Right Hemiplegia - caused by: Cerebral infarction    Time: 1346-1401 PT Time Calculation (min) (ACUTE ONLY): 15 min   Charges:   PT Evaluation $PT Eval Moderate Complexity: 1 Mod          Zenaida Niece, PT, DPT Acute  Rehabilitation Pager: [phone] Office [phone]   Zenaida Niece 09/21/2021, 3:25 PM

## 2021-09-21 NOTE — Evaluation (Signed)
Occupational Therapy Evaluation Patient Details Name: Lindsey [name] MRN: [id number] DOB: 07-23-1956 Today's Date: 09/21/2021   History of Present Illness 65 y.o. female presents to [facility] hospital on 09/19/2021 with nausea/vomiting, diaphoresis, and malaise. Pt admitted for management of R sided pyelonephritis with developing renal abscess. 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   Clinical Impression   Pt's daughter provided information via phone conversation. PTA, pt was living with her daughter and required assistance with dressing, bathing, grooming. Pt was able to self-feed after setup assistance. Pt was ambulating with cane and use of R knee brace to prevent buckling. Pt was conversing more than yes/no at baseline. Pt's daughter reports noticeable changes since Sunday including speech (word finding difficulties, slurred speech, unintelligible speech), pt decreased cognition (per daughter, pt is "not as sharp"), and has decreased control of food on R side of mouth when eating.   Pt received in bed and agreeable to OT session. Pt required modA for bed mobility, modA for stand-pivot transfer from EOB to recliner, knee brace was not used during this session. She requires modA for LB dressing, minA for grooming and eating. Pt communicating in yes/no answers, unsure accuracy of answers. Pt required max cues to locate nurse call button.   Discussed d/c plan with pt's daughter who reports she feels comfortable with assisting pt at current level of functioning. They have w/c, hospital bed, BSC, chair lift, and shower seat and will continue to utilize this equipment at d/c. Pt's daughter's preference was for outpatient OT, which is recommended by OT. Pt will continue to benefit from skilled OT services to maximize safety and independence with ADL/IADL and functional mobility. Will continue to follow acutely and progress as tolerated.       Recommendations  for follow up therapy are one component of a multi-disciplinary discharge planning process, led by the attending physician.  Recommendations may be updated based on patient status, additional functional criteria and insurance authorization.   Follow Up Recommendations  Outpatient OT    Assistance Recommended at Discharge Frequent or constant Supervision/Assistance  Functional Status Assessment  Patient has had a recent decline in their functional status and demonstrates the ability to make significant improvements in function in a reasonable and predictable amount of time.  Equipment Recommendations  None recommended by OT    Recommendations for Other Services       Precautions / Restrictions Precautions Precautions: Fall Precaution Comments: R hemiplegia and global aphasia Required Braces or Orthoses:  (at baseline pt wearing R knee brace with ambulation due to buckling) Restrictions Weight Bearing Restrictions: No      Mobility Bed Mobility Overal bed mobility: Needs Assistance Bed Mobility: Supine to Sit     Supine to sit: Mod assist;HOB elevated     General bed mobility comments: modA to progress trunk to uprigh posture    Transfers Overall transfer level: Needs assistance Equipment used: 2 person hand held assist Transfers: Sit to/from Stand;Bed to chair/wheelchair/BSC Sit to Stand: Min assist;+2 physical assistance;+2 safety/equipment Stand pivot transfers: Mod assist         General transfer comment: minA+2 for sit<>stand transfer;modA for stand pivot transfer with 2 person hand held assistance. knee brace not utilized during transfer. Daughter reports pt will complete stand-pivot transfers without knee brace at baseline, but always has knee brace on with ambulation      Balance Overall balance assessment: Needs assistance Sitting-balance support: No upper extremity supported;Feet supported Sitting balance-Leahy  Scale: Fair Sitting balance - Comments: did  not challenge balance   Standing balance support: Single extremity supported Standing balance-Leahy Scale: Poor Standing balance comment: heavy reliance on single UE support in standing                           ADL either performed or assessed with clinical judgement   ADL Overall ADL's : Needs assistance/impaired Eating/Feeding: Minimal assistance   Grooming: Minimal assistance   Upper Body Bathing: Moderate assistance   Lower Body Bathing: Moderate assistance;Sit to/from stand   Upper Body Dressing : Moderate assistance;Sitting   Lower Body Dressing: Moderate assistance;Sit to/from stand   Toilet Transfer: Moderate assistance;Stand-pivot Toilet Transfer Details (indicate cue type and reason): from EOB to recliner, knee brace was not on during transfer, noted knee buckling and requiring modA for support Toileting- Clothing Manipulation and Hygiene: Moderate assistance;Sit to/from stand Toileting - Clothing Manipulation Details (indicate cue type and reason): to powerup into standing and for stability and assistance with posterior care     Functional mobility during ADLs: Moderate assistance General ADL Comments: pt limited by cognition, instability, decreased activity tolerance     Vision   Additional Comments: difficult to formally assess due to cognition     Perception     Praxis      Pertinent Vitals/Pain Pain Assessment: No/denies pain Pain Intervention(s): Limited activity within patient's tolerance;Monitored during session     Hand Dominance     Extremity/Trunk Assessment Upper Extremity Assessment Upper Extremity Assessment: RUE deficits/detail;LUE deficits/detail RUE Deficits / Details: hx of hemiplegia from previous CVA. Pt with flexion contracture. LUE Deficits / Details: overall WFL   Lower Extremity Assessment Lower Extremity Assessment: RLE deficits/detail;Defer to PT evaluation RLE Deficits / Details: knee buckling during weight  shifting. daughter reprots pt typically has a knee brace on at all times during ambulation.   Cervical / Trunk Assessment Cervical / Trunk Assessment: Normal   Communication Communication Communication: Expressive difficulties;Receptive difficulties   Cognition Arousal/Alertness: Awake/alert Behavior During Therapy: WFL for tasks assessed/performed Overall Cognitive Status: Impaired/Different from baseline Area of Impairment: Following commands;Problem solving                       Following Commands: Follows one step commands with increased time     Problem Solving: Slow processing;Decreased initiation;Requires verbal cues;Requires tactile cues General Comments: pt's daughter reports she is not at sharp since sunday, reports it feels like pt is looking through her/staring off into space, daughter noted change in speech (difficulty word finding and intelligible speech. at baseline pt talking in more sentence like structure than yes/no. At baseline pt marks off days on calendar, unsure orientation at this time.     General Comments  vss    Exercises     Shoulder Instructions      Home Living Family/patient expects to be discharged to:: Private residence Living Arrangements: Children Available Help at Discharge: Family Type of Home: House Home Access: Level entry     Home Layout: Multi-level;Able to live on main level with bedroom/bathroom;1/2 bath on main level Alternate Level Stairs-Number of Steps: flight, chair lift   Bathroom Shower/Tub: Walk-in shower   Bathroom Toilet: Standard Bathroom Accessibility: Yes How Accessible: Accessible via walker Home Equipment: Shower seat;Wheelchair - manual;Cane - single Barista (2 wheels);Other (comment);Hospital bed (stair lift.)   Additional Comments: Daughter provided PLOF and home living information via phone conversation; daughter reports since sunday  pt has had increased food coming out of right side of  mouth when eating, difficulty with word finding/slurred speech/unintelligible speech and pt does not appear as "sharp". pt baseline converses more than yes/no      Prior Functioning/Environment Prior Level of Function : Needs assist  Cognitive Assist : ADLs (cognitive)   ADLs (Cognitive): Intermittent cues Physical Assist : Mobility (physical);ADLs (physical) Mobility (physical): Gait;Stairs ADLs (physical): Grooming;Bathing;Dressing;Toileting;IADLs Mobility Comments: pt was working on walking with a cane;daughter reports she would be close by but otherwise pt was. pt has a knee brace. ADLs Comments: needs assistance with bathing, dressing, grooming, setup for feeding        OT Problem List: Decreased activity tolerance;Impaired balance (sitting and/or standing);Decreased safety awareness;Decreased knowledge of use of DME or AE      OT Treatment/Interventions: Self-care/ADL training;Therapeutic exercise;Energy conservation;DME and/or AE instruction;Therapeutic activities;Patient/family education;Balance training    OT Goals(Current goals can be found in the care plan section) Acute Rehab OT Goals Patient Stated Goal: daughter's goal is for pt to return to baseline OT Goal Formulation: With patient Time For Goal Achievement: 10/05/21 Potential to Achieve Goals: Good ADL Goals Pt Will Perform Eating: with set-up;sitting Pt Will Perform Grooming: with set-up;sitting Pt Will Transfer to Toilet: with min guard assist;ambulating  OT Frequency: Min 2X/week   Barriers to D/C:            Co-evaluation PT/OT/SLP Co-Evaluation/Treatment: Yes Reason for Co-Treatment: Complexity of the patient's impairments (multi-system involvement);For patient/therapist safety;To address functional/ADL transfers   OT goals addressed during session: ADL's and self-care      AM-PAC OT "6 Clicks" Daily Activity     Outcome Measure Help from another person eating meals?: A Little Help from another  person taking care of personal grooming?: A Little Help from another person toileting, which includes using toliet, bedpan, or urinal?: A Lot Help from another person bathing (including washing, rinsing, drying)?: A Lot Help from another person to put on and taking off regular upper body clothing?: A Little Help from another person to put on and taking off regular lower body clothing?: A Lot 6 Click Score: 15   End of Session Nurse Communication: Mobility status;Other (comment) (PLOF provided by daughter)  Activity Tolerance: Patient tolerated treatment well Patient left: in chair;with call bell/phone within reach;with chair alarm set;Other (comment) (posey belt)  OT Visit Diagnosis: Other abnormalities of gait and mobility (R26.89);Muscle weakness (generalized) (M62.81);Other symptoms and signs involving cognitive function                Time: 1550-1615 OT Time Calculation (min): 25 min Charges:  OT General Charges $OT Visit: 1 Visit OT Evaluation $OT Eval Moderate Complexity: [place] OTR/L Acute Rehabilitation Services Office: [place] 09/21/2021, 2:33 PM

## 2021-09-22 ENCOUNTER — Inpatient Hospital Stay (HOSPITAL_COMMUNITY): Payer: Medicare Other

## 2021-09-22 DIAGNOSIS — I639 Cerebral infarction, unspecified: Secondary | ICD-10-CM

## 2021-09-22 LAB — URINE CULTURE: Culture: >=100000 — AB

## 2021-09-22 IMAGING — MR MR HEAD W/O CM
12 of 13 series · 44 of 48 positions shown · non-contrast
Comparison: Brain MRI [DATE].  Head CT [DATE].
COMPARISON: Brain MRI [DATE].  Head CT [DATE].

Addendum:
CLINICAL DATA: 65-year-old female with altered mental status.
Atrial fibrillation. Previous left MCA infarct.

EXAM:
MRI HEAD WITHOUT CONTRAST
TECHNIQUE: Multiplanar, multiecho pulse sequences of the brain and surrounding
structures were obtained without intravenous contrast.

[Series 5: DWI · axial · 3.0mm · 0.88mm/px · z∈[-64,+83]mm · 8 of 100 slices shown (1 of 4)]
[im 1/100]
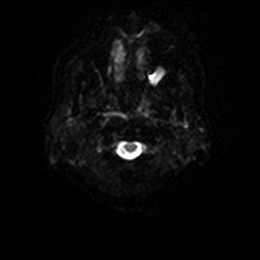
[im 15/100]
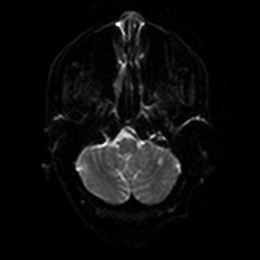
[im 29/100]
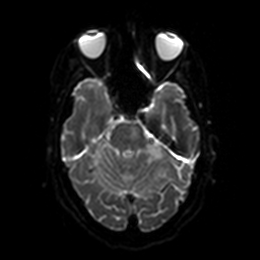
[im 43/100]
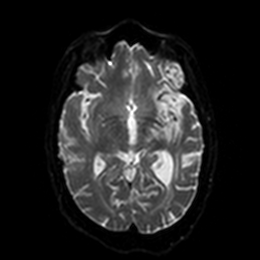
[im 57/100]
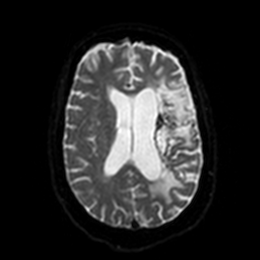
[im 71/100]
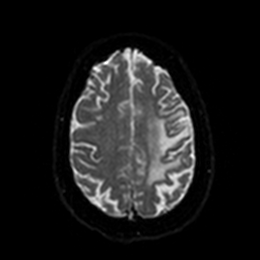
[im 85/100]
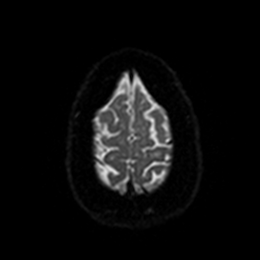
[im 100/100]
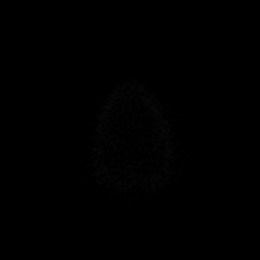

[Series 6: DWI · axial · 3.0mm · 0.88mm/px · z∈[-64,+83]mm · 5 of 50 slices shown (2 of 4)]
[im 1/50]
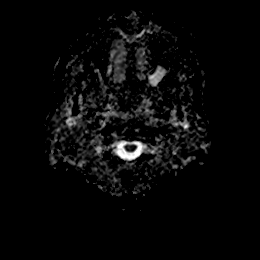
[im 13/50]
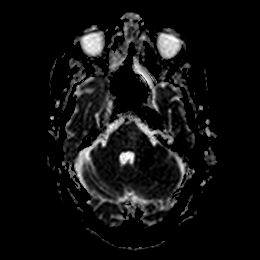
[im 25/50]
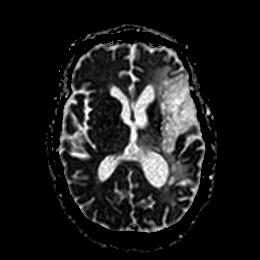
[im 37/50]
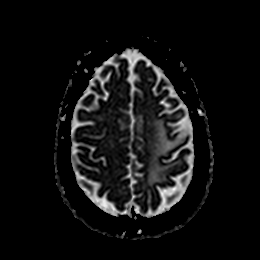
[im 50/50]
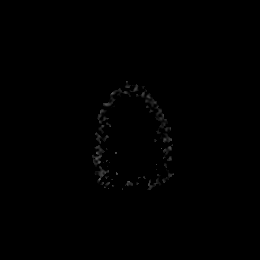

[Series 7: DWI · coronal · 4.0mm · 0.88mm/px · 5 of 64 slices shown (3 of 4)]
[im 1/64]
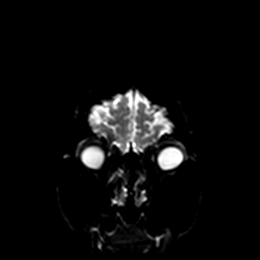
[im 16/64]
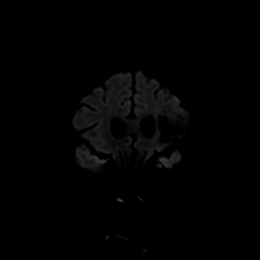
[im 32/64]
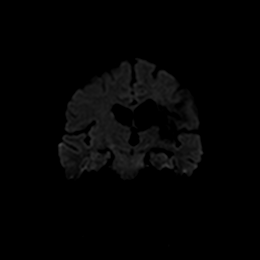
[im 48/64]
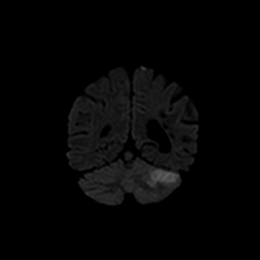
[im 64/64]
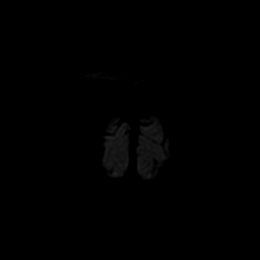

[Series 8: DWI · coronal · 4.0mm · 0.88mm/px · 2 of 32 slices shown (4 of 4)]
[im 1/32]
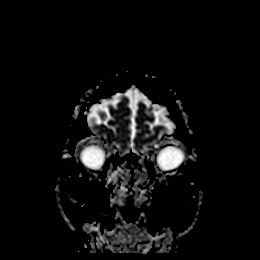
[im 32/32]
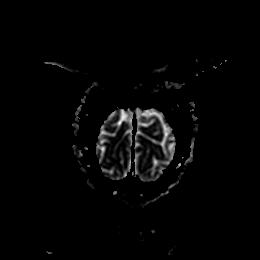

[Series 9: T1 · sagittal · 5.0mm · 0.75mm/px · 2 of 23 slices shown]
[im 1/23]
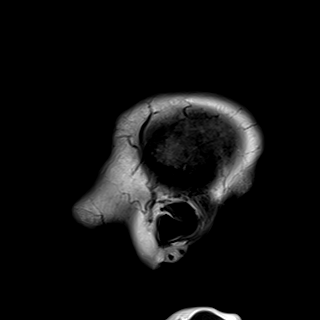
[im 23/23]
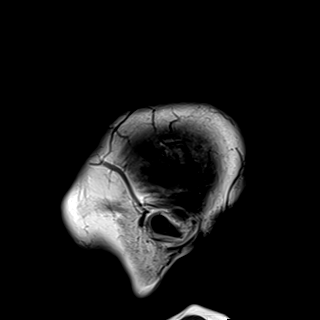

[Series 10: T2 · axial · 5.0mm · 0.72mm/px · z∈[-63,+87]mm · 2 of 26 slices shown (1 of 2)]
[im 1/26]
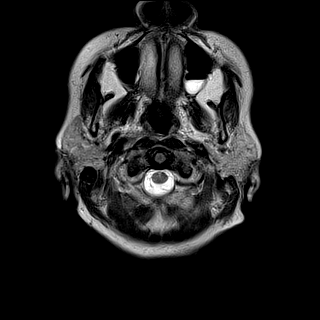
[im 26/26]
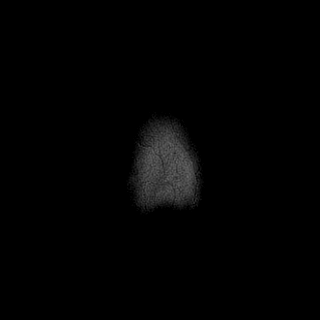

[Series 11: FLAIR · axial · 5.0mm · 0.45mm/px · z∈[-63,+86]mm · 2 of 26 slices shown]
[im 1/26]
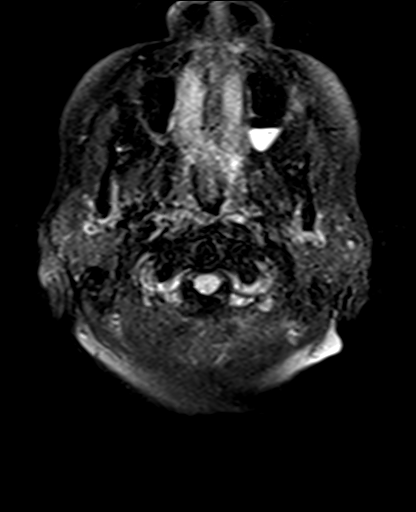
[im 26/26]
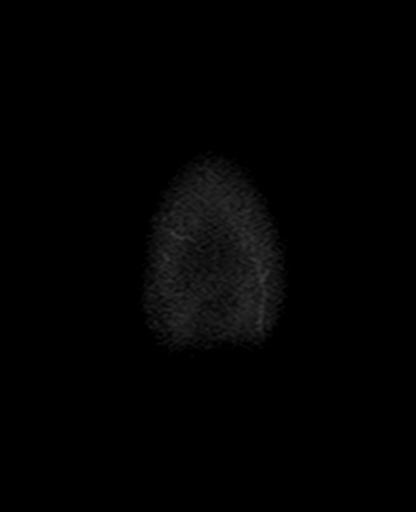

[Series 12: mag_images · axial · 3.0mm · 0.90mm/px · z∈[-77,+100]mm · 4 of 60 slices shown]
[im 1/60]
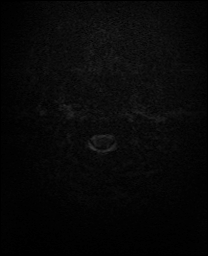
[im 20/60]
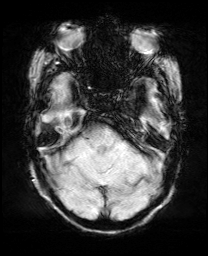
[im 40/60]
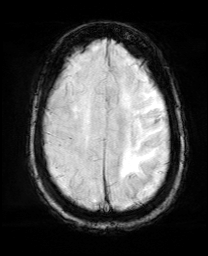
[im 60/60]
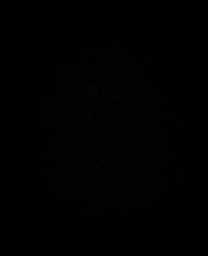

[Series 13: pha_images · axial · 3.0mm · 0.90mm/px · z∈[-77,+97]mm · 4 of 59 slices shown]
[im 1/59]
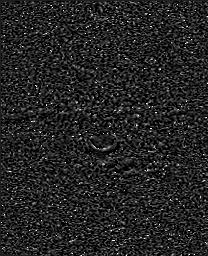
[im 20/59]
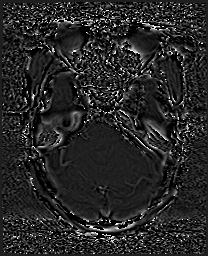
[im 39/59]
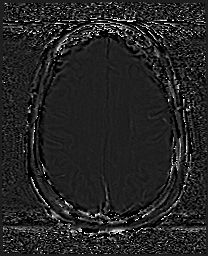
[im 59/59]
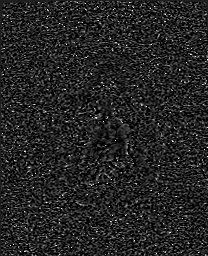

[Series 14: swi_images · axial · 3.0mm · 0.90mm/px · z∈[-77,+100]mm · 4 of 60 slices shown]
[im 1/60]
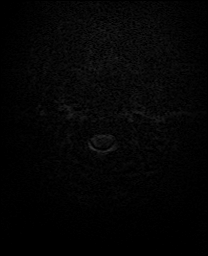
[im 20/60]
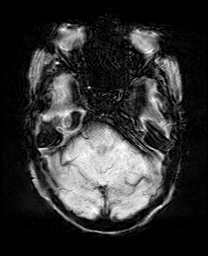
[im 40/60]
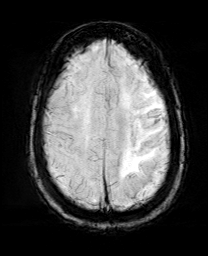
[im 60/60]
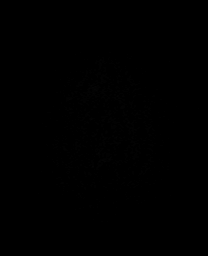

[Series 15: mip_images(sw) · axial · 24.0mm · 0.90mm/px · z∈[-66,+89]mm · 4 of 53 slices shown]
[im 1/53]
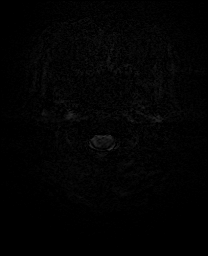
[im 18/53]
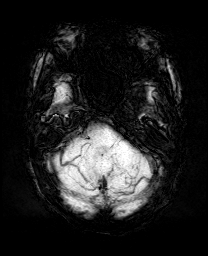
[im 35/53]
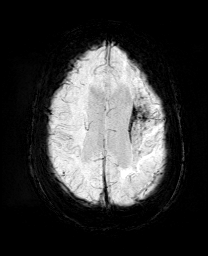
[im 53/53]
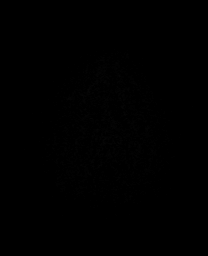

[Series 17: T2 · coronal · 5.0mm · 0.34mm/px · 2 of 29 slices shown (2 of 2)]
[im 1/29]
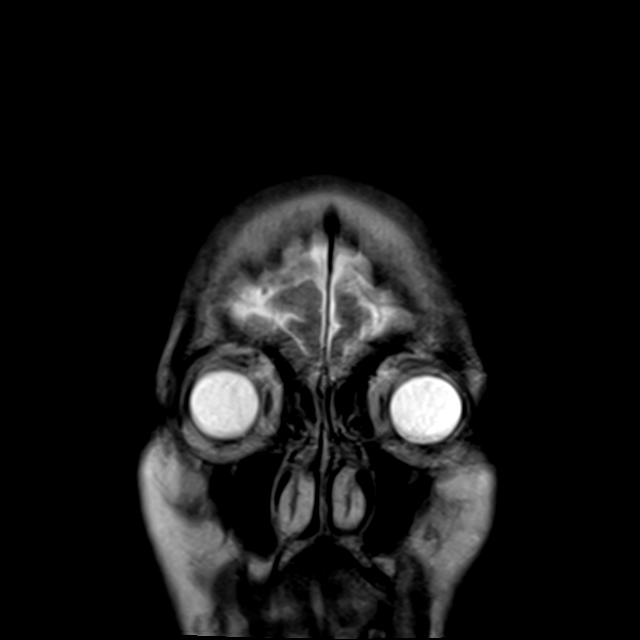
[im 29/29]
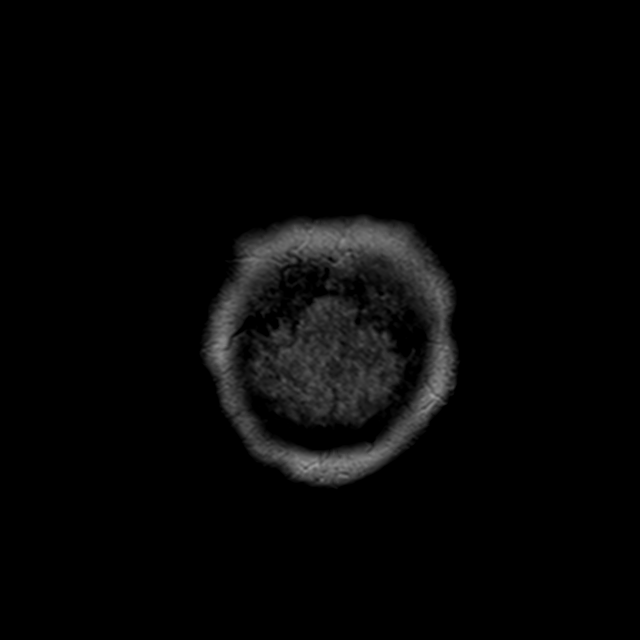

[44 of 48 positions shown; findings below may reference images not displayed]

FINDINGS: Brain: Patchy and confluent restricted diffusion in the left
cerebellum, primarily the AICA territory with involvement near the
left middle cerebellar peduncle (series 5, image 63 and series 7,
image 43. The infarct encompasses roughly 4 cm. T2 and FLAIR
hyperintense cytotoxic edema with associated cerebellar petechial
hemorrhage (series 14, image 16), but no hemorrhagic transformation.

Superimposed small chronic left PICA territory infarcts, although
new from the MRI last year.

Also there is significant left brainstem Wallerian degeneration
since the MRI last year, through to the left medullary pyramid.

And encephalomalacia with gliosis and laminar necrosis in the left
MCA territory related to the infarct there last year. Abundant
associated hemosiderin. Ex vacuo enlargement of the left lateral
ventricle.

No other restricted diffusion. No midline shift, mass effect,
evidence of mass lesion, ventriculomegaly. Cervicomedullary junction
and pituitary are within normal limits. Patchy right hemisphere
cerebral white matter T2 and FLAIR hyperintensity is stable from
last year. No other cortical encephalomalacia or chronic cerebral
blood products identified.

Vascular: Major intracranial vascular flow voids are stable since
last year.

Skull and upper cervical spine: Negative visible cervical spine.
Visualized bone marrow signal is within normal limits.

Sinuses/Orbits: Negative orbits. Left ethmoid and left maxillary
sinus fluid or mucosal thickening

Other: Mastoids are clear. Grossly normal visible internal auditory
structures. Negative visible scalp and face.
IMPRESSION: 1. Acute moderate size Left AICA cerebellar infarct. Petechial
hemorrhage but no malignant hemorrhagic transformation. Cytotoxic
edema but no posterior fossa mass effect at this time.

2. Underlying extensive chronic left MCA territory encephalomalacia
related to the [YO] infarct. Associated left brainstem Wallerian
degeneration since last year.
And small superimposed chronic Left PICA cerebellar infarcts, but
also new since last year.

ADDENDUM:
Study discussed by telephone with Dr. BLANK on [DATE] at
[YO] hours.

*** End of Addendum ***
FINDINGS: Brain: Patchy and confluent restricted diffusion in the left
cerebellum, primarily the AICA territory with involvement near the
left middle cerebellar peduncle (series 5, image 63 and series 7,
image 43. The infarct encompasses roughly 4 cm. T2 and FLAIR
hyperintense cytotoxic edema with associated cerebellar petechial
hemorrhage (series 14, image 16), but no hemorrhagic transformation.

Superimposed small chronic left PICA territory infarcts, although
new from the MRI last year.

Also there is significant left brainstem Wallerian degeneration
since the MRI last year, through to the left medullary pyramid.

And encephalomalacia with gliosis and laminar necrosis in the left
MCA territory related to the infarct there last year. Abundant
associated hemosiderin. Ex vacuo enlargement of the left lateral
ventricle.

No other restricted diffusion. No midline shift, mass effect,
evidence of mass lesion, ventriculomegaly. Cervicomedullary junction
and pituitary are within normal limits. Patchy right hemisphere
cerebral white matter T2 and FLAIR hyperintensity is stable from
last year. No other cortical encephalomalacia or chronic cerebral
blood products identified.

Vascular: Major intracranial vascular flow voids are stable since
last year.

Skull and upper cervical spine: Negative visible cervical spine.
Visualized bone marrow signal is within normal limits.

Sinuses/Orbits: Negative orbits. Left ethmoid and left maxillary
sinus fluid or mucosal thickening

Other: Mastoids are clear. Grossly normal visible internal auditory
structures. Negative visible scalp and face.
IMPRESSION: 1. Acute moderate size Left AICA cerebellar infarct. Petechial
hemorrhage but no malignant hemorrhagic transformation. Cytotoxic
edema but no posterior fossa mass effect at this time.

2. Underlying extensive chronic left MCA territory encephalomalacia
related to the [YO] infarct. Associated left brainstem Wallerian
degeneration since last year.
And small superimposed chronic Left PICA cerebellar infarcts, but
also new since last year.

## 2021-09-22 IMAGING — CT CT ANGIO HEAD-NECK (W OR W/O PERF)
1 of 11 series · 5 of 33 positions shown · IV contrast (APPLIED)
Comparison: MR head obtained earlier the same day, noncontrast CT
head [DATE]

CLINICAL DATA: Stroke follow-up

EXAM:
CT ANGIOGRAPHY HEAD AND NECK
TECHNIQUE: Multidetector CT imaging of the head and neck was performed using
the standard protocol during bolus administration of intravenous
contrast. Multiplanar CT image reconstructions and MIPs were
obtained to evaluate the vascular anatomy. Carotid stenosis
measurements (when applicable) are obtained utilizing NASCET
criteria, using the distal internal carotid diameter as the
denominator.
CONTRAST:  65mL OMNIPAQUE IOHEXOL 350 MG/ML SOLN

[Series 11: ax thins · axial · 0.39mm/px · z∈[-262,-43]mm · 5 of 331 slices shown]
[im 56/331  soft-tissue]
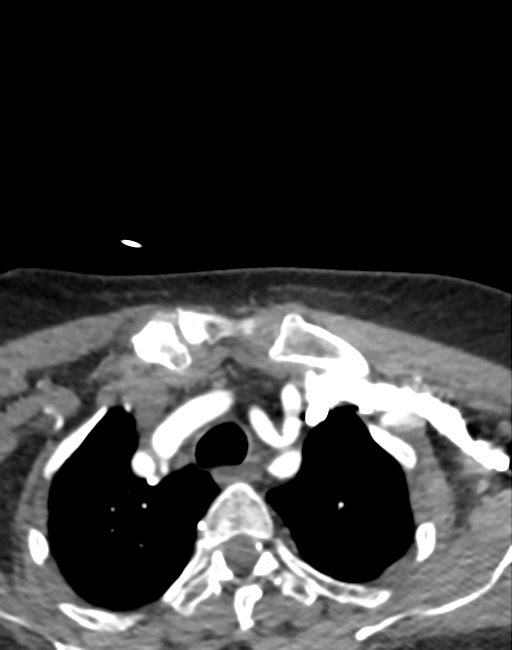
[im 111/331  bone]
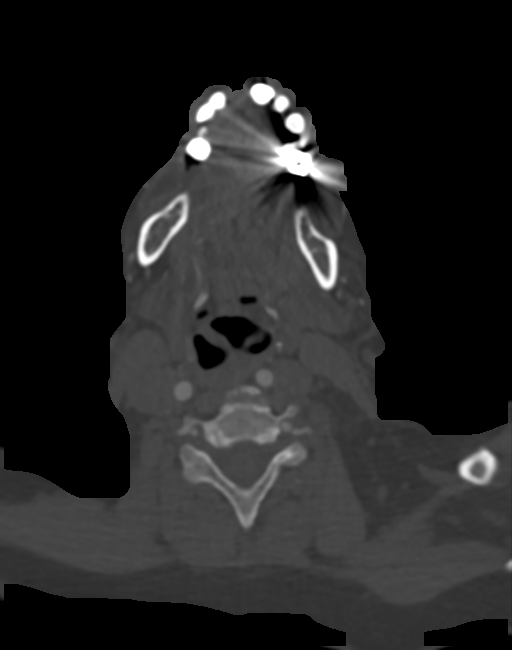
[im 166/331  soft-tissue]
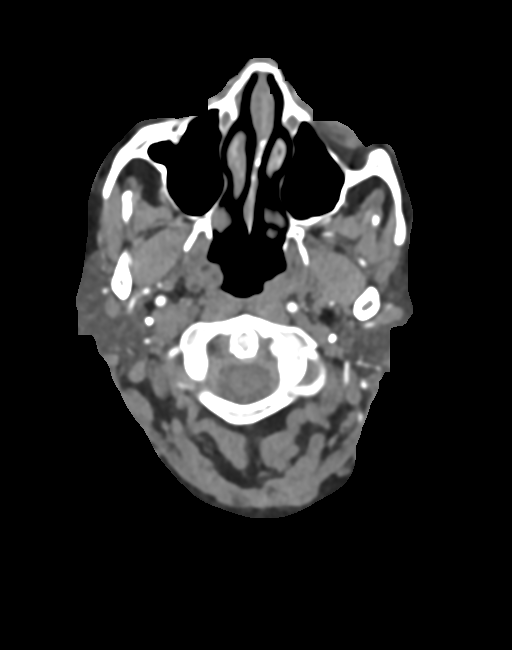
[im 221/331  bone]
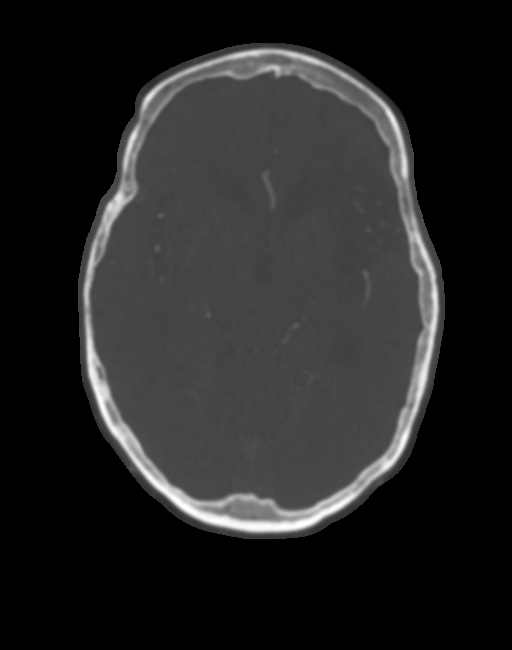
[im 276/331  soft-tissue]
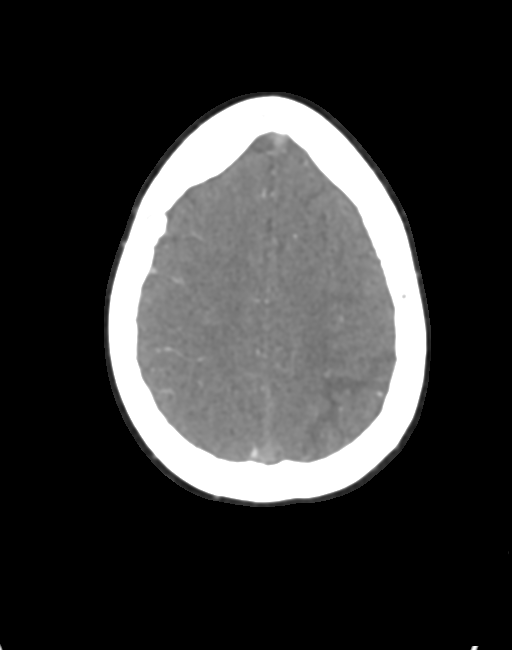

[5 of 33 positions shown; findings below may reference images not displayed]

FINDINGS: CT HEAD FINDINGS

Brain: Is hypodensity in the left cerebellar hemisphere consistent
with evolving infarct in the SCA distribution. Petechial hemorrhage
seen on the prior brain MRI is not well appreciated on the current
study. There is no evidence of hematoma formation. There is no
significant regional mass effect, and the fourth ventricle remains
patent

The remote infarct in the left MCA distribution and associated
wallerian degeneration extending into the brainstem are again seen.
There is unchanged ex vacuo dilatation of the left lateral
ventricle. The ventricles are otherwise normal in size. A remote
infarct in the left PICA distribution and additional remote infarct
in the left occipital lobe are unchanged.

Vascular: See below.

Skull: Normal. Negative for fracture or focal lesion.

Sinuses and orbits: There is mucosal thickening and partial
opacification of posterior left and anterior right ethmoid air
cells. The globes and orbits are unremarkable.

Other: None.

Review of the MIP images confirms the above findings

CTA NECK FINDINGS

Aortic arch: Is minimal calcified atherosclerotic plaque in the
thoracic aorta. The origins of the major branch vessels are patent.
There is no evidence of dissection to the level imaged.

Right carotid system: There is a medialized course of the right
common and proximal internal carotid artery. There is minimal
calcified atherosclerotic plaque at the right carotid bulb without
hemodynamically significant stenosis or occlusion. There is no
dissection or aneurysm.

Left carotid system: There is a medialized course of the left common
and proximal internal carotid artery. There is primarily calcified
atherosclerotic plaque at the left carotid bulb resulting in less
than 50% stenosis. There is no dissection or aneurysm.

Vertebral arteries: The left vertebral artery is dominant, a normal
variant. The vertebral arteries are patent, without hemodynamically
significant stenosis, occlusion, dissection, or aneurysm.

Skeleton: There is minimal degenerative change of the cervical
spine. There is no visible canal hematoma. There is no acute osseous
abnormality or aggressive osseous lesion.

Other neck: A few prominent retropectoral lymph nodes bilaterally
are not significantly changed since [DATE], nonspecific. The
soft tissues are otherwise unremarkable.

Upper chest: The imaged lung apices are clear.

Review of the MIP images confirms the above findings

CTA HEAD FINDINGS

Anterior circulation: There is minimal calcified atherosclerotic
plaque in the intracranial ICAs without significant stenosis or
occlusion.

The bilateral MCAs are patent. The bilateral ACAs are patent. The
anterior communicating artery is patent.

There is no aneurysm.

Posterior circulation: The bilateral V4 segments are patent, left
larger than right. PICA origins are patent bilaterally. The superior
cerebellar arteries are patent proximally bilaterally. There is
focal stenosis and near occlusion of the left SCA (12-135), but the
ICA appears patent distal to this point.

The basilar artery is patent

The bilateral PCAs are patent. A posterior communicating artery is
identified on the right. The left PCOM is not definitely seen.

There is no aneurysm.

Venous sinuses: Not well assessed due to contrast timing.

Anatomic variants: None.

Review of the MIP images confirms the above findings
IMPRESSION: 1. Expected evolution of the acute to early subacute infarct in the
left SCA distribution without evidence of malignant hemorrhagic
transformation. No significant regional mass effect or effacement of
the fourth ventricle.
2. Focal stenosis and near occlusion of the left SCA shortly after
the origin with distal reconstitution. Otherwise, patent
intracranial vasculature with no other hemodynamically significant
stenosis or occlusion.
3. Mild calcified atherosclerotic plaque in the left carotid bulb
and minimal plaque in the right carotid bulb without hemodynamically
significant stenosis or occlusion. Patent vertebral arteries.
4. Unchanged remote infarcts in the left cerebral and cerebellar
hemispheres.

## 2021-09-22 MED ORDER — LEVOFLOXACIN 500 MG PO TABS: 750.0000 mg | ORAL_TABLET | Freq: Every day | ORAL | Status: DC

## 2021-09-22 MED ORDER — LEVOFLOXACIN 750 MG PO TABS
750.0000 mg | ORAL_TABLET | Freq: Every day | ORAL | Status: DC
  Filled 2021-09-22: qty 1

## 2021-09-22 MED ORDER — ASPIRIN EC 81 MG PO TBEC
81.0000 mg | DELAYED_RELEASE_TABLET | Freq: Every day | ORAL | Status: DC
  Administered 2021-09-22 – 2021-09-24 (×2): 81 mg via ORAL
  Filled 2021-09-22 (×2): qty 1

## 2021-09-22 MED ORDER — CEFAZOLIN SODIUM-DEXTROSE 2-4 GM/100ML-% IV SOLN
2.0000 g | Freq: Three times a day (TID) | INTRAVENOUS | Status: DC
  Administered 2021-09-22: 2 g via INTRAVENOUS
  Filled 2021-09-22 (×2): qty 100

## 2021-09-22 MED ORDER — STROKE: EARLY STAGES OF RECOVERY BOOK
Freq: Once | Status: AC
  Filled 2021-09-22: qty 1

## 2021-09-22 MED ORDER — CEFAZOLIN SODIUM-DEXTROSE 1-4 GM/50ML-% IV SOLN
1.0000 g | Freq: Three times a day (TID) | INTRAVENOUS | Status: DC
  Filled 2021-09-22: qty 50

## 2021-09-22 MED ORDER — IOHEXOL 350 MG/ML SOLN
65.0000 mL | Freq: Once | INTRAVENOUS | Status: AC | PRN
  Administered 2021-09-22: 65 mL via INTRAVENOUS

## 2021-09-22 MED ORDER — CEFAZOLIN SODIUM-DEXTROSE 2-4 GM/100ML-% IV SOLN
2.0000 g | Freq: Three times a day (TID) | INTRAVENOUS | Status: AC
  Administered 2021-09-22: 22:00:00 2 g via INTRAVENOUS
  Filled 2021-09-22 (×3): qty 100

## 2021-09-22 NOTE — Consult Note (Addendum)
Neurology Consultation  Reason for Consult: New AICA cerebellar stroke Referring Physician: Alita Chyle, MD  CC: Worsening aphasia   History is obtained from: Epic, Primary MD and Grandson at bedside  HPI: Lindsey [name] is a 65 y.o. 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 She presented with vomiting, diaphoresis and malaise and primary admitted her for Multifocal pyelonephritis. Initial CTH negative for stroke. However, patient still with worsened aphasia and chronic right arm hemiplegia after resolution of other neuro deficits. MRI-brain obtained showing acute left AICA cerebellar infarct without hemorrhagic transformation as well as small left PICA infarcts, indicative of embolic strokes and neurology was consulted for further evaluation.  LKW: Prior to admission TNK given?: no, outside of window IR Thrombectomy? No, No LVO on CTA Modified Rankin Scale: 5-Severe disability-bedridden, incontinent, needs constant attention  ROS: A complete ROS was performed and is negative except as noted in the HPI.  Past Medical History:  Diagnosis Date   Diverticulosis    Hypertension    Uterine prolapse   History reviewed. No pertinent surgical history.  Family History  Problem Relation Age of Onset   Breast cancer Neg Hx    Social History:   reports that she has never smoked. She has never used smokeless tobacco. She reports that she does not currently use alcohol. She reports that she does not use drugs.  Medications  Current Facility-Administered Medications:    amLODipine (NORVASC) tablet 5 mg, 5 mg, Oral, Daily, Virl Axe, MD, 5 mg at 09/22/21 1027   apixaban (ELIQUIS) tablet 5 mg, 5 mg, Oral, BID, Virl Axe, MD, 5 mg at 09/22/21 1027   atorvastatin (LIPITOR) tablet 20 mg, 20 mg, Oral, q1800, Virl Axe, MD, 20 mg at 09/21/21 1610   [START ON 09/23/2021] levofloxacin  (LEVAQUIN) tablet 750 mg, 750 mg, Oral, Daily, Corky Sox, MD   metoprolol tartrate (LOPRESSOR) tablet 50 mg, 50 mg, Oral, BID, Virl Axe, MD, 50 mg at 09/22/21 1027   ondansetron (ZOFRAN) tablet 4 mg, 4 mg, Oral, Q12H PRN, Corky Sox, MD   Exam: Current vital signs: BP (!) 151/90 (BP Location: Left Arm)    Pulse 70    Temp 99.4 F (37.4 C) (Oral)    Resp 16    Ht 5\' 4"  (1.626 m)    Wt 74.8 kg    SpO2 100%    BMI 28.32 kg/m  Vital signs in last 24 hours: Temp:  [98.1 F (36.7 C)-99.4 F (37.4 C)] 99.4 F (37.4 C) (12/21 1431) Pulse Rate:  [65-147] 70 (12/21 1431) Resp:  [16-19] 16 (12/21 1431) BP: (109-151)/(69-91) 151/90 (12/21 1431) SpO2:  [90 %-100 %] 100 % (12/21 1431) Weight:  [74.8 kg] 74.8 kg (12/21 1400)  GENERAL: Awake, alert, in no acute distress Psych: Affect appropriate for situation, patient is calm and cooperative with examination Head: Normocephalic and atraumatic, without obvious abnormality EENT: Normal conjunctivae, dry mucous membranes, no OP obstruction LUNGS: Normal respiratory effort. Non-labored breathing on room air CV: Regular rate and rhythm on telemetry ABDOMEN: Soft, non-tender, non-distended Extremities: warm, well perfused, without obvious deformity  NEURO:  Mental Status: Awake, alert,  She is unable to provide a clear and coherent history of present illness due to aphasia Speech/Language: speech is impaired.   She has an expressive > receptive aphasia.  Cranial Nerves:  II: PERRL-58mm/brisk. visual fields full.  III, IV, VI: EOMI. Lid elevation symmetric and full.  V: Sensation is intact to light touch and symmetrical to face. Blinks to threat. Moves jaw back and forth.  VII: Right facial droop resting and smiling. Unable to follow commands to puff cheeks and raise eyebrows.  VIII: Hearing intact to voice IX, X: Palate elevation is symmetric. Phonation normal.  XI: Decreased sternocleidomastoid and trapezius muscle strength XII:  Tongue protrudes midline without fasciculations.   Motor: 3+/5 strength on the right, she has 5/5 strength in left arm, 4+/5 left leg Sensation: Intact to light touch bilaterally in all four extremities.  Coordination: FTN intact left side and unable to perform on right side due to hemiplegia.  DTRs: 2+ throughout.  Gait: Deferred  NIHSS: 1a Level of Conscious.: 0 1b LOC Questions: 2 1c LOC Commands: 1 2 Best Gaze: 0 3 Visual: 0 4 Facial Palsy: 1 5a Motor Arm - left: 0 5b Motor Arm - Right: 4 6a Motor Leg - Left: 0 6b Motor Leg - Right: 3 7 Limb Ataxia: 0 8 Sensory: 0 9 Best Language: 2 10 Dysarthria: 1 11 Extinct. and Inatten.: 0 TOTAL: 14   Labs I have reviewed labs in epic and the results pertinent to this consultation are:  CBC    Component Value Date/Time   WBC 5.2 09/21/2021 0221   RBC 3.70 (L) 09/21/2021 0221   HGB 10.9 (L) 09/21/2021 0221   HGB 10.5 (L) 08/17/2014 1806   HCT 31.7 (L) 09/21/2021 0221   HCT 30.9 (L) 08/17/2014 1806   PLT 373 09/21/2021 0221   PLT 461 (H) 08/17/2014 1806   MCV 85.7 09/21/2021 0221   MCV 99 08/17/2014 1806   MCH 29.5 09/21/2021 0221   MCHC 34.4 09/21/2021 0221   RDW 15.0 09/21/2021 0221   RDW 16.3 (H) 08/17/2014 1806   LYMPHSABS 1.1 05/01/2020 0708   MONOABS 1.0 05/01/2020 0708   EOSABS 0.1 05/01/2020 0708   BASOSABS 0.0 05/01/2020 0708     CMP     Component Value Date/Time   NA 139 09/21/2021 0221   NA 141 08/17/2014 1806   K 3.8 09/21/2021 0221   K 3.2 (L) 08/17/2014 1806   CL 104 09/21/2021 0221   CL 102 08/17/2014 1806   CO2 26 09/21/2021 0221   CO2 33 (H) 08/17/2014 1806   GLUCOSE 97 09/21/2021 0221   GLUCOSE 92 08/17/2014 1806   BUN 6 (L) 09/21/2021 0221   BUN 6 (L) 08/17/2014 1806   CREATININE 0.69 09/21/2021 0221   CREATININE 0.57 (L) 08/17/2014 1806   CALCIUM 9.3 09/21/2021 0221   CALCIUM 8.1 (L) 08/17/2014 1806   PROT 8.1 09/20/2021 0305   PROT 7.7 08/17/2014 1806   ALBUMIN 3.2 (L) 09/20/2021 0305    ALBUMIN 3.3 (L) 08/17/2014 1806   AST 27 09/20/2021 0305   AST 122 (H) 08/17/2014 1806   ALT 30 09/20/2021 0305   ALT 183 (H) 08/17/2014 1806   ALKPHOS 78 09/20/2021 0305   ALKPHOS 107 08/17/2014 1806   BILITOT 0.6 09/20/2021 0305   BILITOT 0.4 08/17/2014 1806   GFRNONAA >60 09/21/2021 0221   GFRNONAA >60 08/17/2014 1806   GFRAA >60 05/25/2020 0508   GFRAA >60 08/17/2014 1806    Lipid Panel     Component Value Date/Time   CHOL 87 (L) 11/02/2020 0926   TRIG 59 11/02/2020 0926   HDL 41 11/02/2020 0926   CHOLHDL 2.1 11/02/2020 0926   CHOLHDL 3.3 04/22/2020 0338   VLDL 14 04/22/2020 0338   LDLCALC 32 11/02/2020 0926  Lab Results  Component Value Date   HGBA1C 6.1 (H) 04/22/2020   Imaging I have reviewed the images obtained:  09/19/21: CT-scan of the brain-No acute intracranial abnormalities.Old left MCA distribution infarct has evolved since the prior head CT. Small left cerebellar infarct, also old.  09/22/21: CTA H&N: Expected evolution of the acute to early subacute infarct in the left SCA distribution without evidence of malignant hemorrhagic transformation. 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 Otherwise, patent intracranial vasculature with no other hemodynamically significant stenosis or occlusion. Mild calcified atherosclerotic plaque in the left carotid bulb and minimal plaque in the right carotid bulb without hemodynamically significant stenosis or occlusion. Patent vertebral arteries. Unchanged remote infarcts in the left cerebral and cerebellar hemispheres.  09/22/21: MRI examination of the brain-Acute moderate size Left AICA cerebellar infarct. Petechial hemorrhage but no malignant hemorrhagic transformation. Cytotoxic edema but no posterior fossa mass effect at this time.Underlying extensive chronic left MCA territory  encephalomalacia related to the 2021 infarct. Associated left brainstem Wallerian degeneration since last year.And small superimposed chronic Left PICA cerebellar infarcts, but also new since last year.  Assessment:   66 year old female patient in with nausea vomiting and malaise found to have pyelonephritis. History of CVA with right arm hemiplegia and aphasia. Stroke risk factors prior stroke, atrial fibrillation and hypertension. MRI brain showed acute left AICA cerebellar infarct without hemorrhagic transformation as well as small left PICA infarcts, indicative of embolic strokes.   Per her grandson at bedside she was able to walk with cane or walker and assistance after stroke in 04/2020 until admission. Now patient has deficits with gait, balance, functional mobility, strength, power, endurance, tone and communication per physical therapy report.   Recommendations: - HgbA1c, fasting lipid panel - Echocardiogram - Hold Eliquis for now - Prophylactic therapy- Antiplatelet med: Aspirin - dose 81 mg daily - Risk factor modification - Telemetry monitoring - PT consult, OT consult, Speech consult - Stroke team to follow  Pt seen by Neuro, NP and later by MD. Note/plan to be edited by MD as needed.  Parke Poisson, AGNP-BC [facility]  I have seen the patient and reviewed the above note.  She has an acute Aiko infarct, which can be thrombotic or embolic.  My suspicion is that it likely represents embolism from her atrial fibrillation, but I would hold on her anticoagulation for the time being given the risk of hemorrhagic conversion in the posterior fossa.  She will need secondary risk factor modification and therapy.  Roland Rack, MD [facility] [phone]  If 7pm- 7am, please page neurology on call as listed in [place].

## 2021-09-22 NOTE — Progress Notes (Signed)
[facility]  Date of Admission:  09/19/2021     Total days of antibiotics 4         ASSESSMENT:  [name] has continued to remain afebrile and denies any urinary symptoms.  Cultures have grown Klebsiella pneumoniae. Given findings of superimposed 3.5 cm collection in the upper pole of the right kidney and concern for developing abscess will change to levofloxacin for 4 weeks. Will need follow up imaging near completion of treatment to determine resolution. Remaining medical and supportive care per primary team. Will arrange follow up in ID office.   PLAN:  Change cefazolin to levofloxacin Goal treatment course of 4 weeks Will need re-imaging to determine resolution.  Remaining medical and supportive care per primary team. Will arrange ID follow up.  ID will sign off. Please re-consult if needed.   Principal Problem:   Pyelonephritis    amLODipine  5 mg Oral Daily   apixaban  5 mg Oral BID   atorvastatin  20 mg Oral q1800   metoprolol tartrate  50 mg Oral BID    SUBJECTIVE:  Afebrile overnight with no acute events. Working with therapy. Family present.   No Known Allergies   Review of Systems: Review of Systems  Constitutional:  Negative for chills, fever and weight loss.  Respiratory:  Negative for cough, shortness of breath and wheezing.   Cardiovascular:  Negative for chest pain and leg swelling.  Gastrointestinal:  Negative for abdominal pain, constipation, diarrhea, nausea and vomiting.  Skin:  Negative for rash.     OBJECTIVE: Vitals:   09/21/21 2117 09/22/21 0542 09/22/21 0939 09/22/21 1200  BP: 131/71 116/78 (!) 138/91 127/88  Pulse: 85 100 65 96  Resp: 18 18 16 18   Temp: 98.9 F (37.2 C) 98.3 F (36.8 C) 99.1 F (37.3 C) 98.1 F (36.7 C)  TempSrc: Oral  Oral Oral  SpO2: 98% 100% 100% 96%   There is no height or weight on file to calculate BMI.  Physical Exam Constitutional:      General: She is not in acute  distress.    Appearance: She is well-developed.     Comments: Seated in the chair next to the bed; pleasant.   Cardiovascular:     Rate and Rhythm: Normal rate and regular rhythm.     Heart sounds: Normal heart sounds.  Pulmonary:     Effort: Pulmonary effort is normal.     Breath sounds: Normal breath sounds.  Skin:    General: Skin is warm and dry.  Neurological:     Mental Status: She is alert and oriented to person, place, and time.  Psychiatric:        Behavior: Behavior normal.        Thought Content: Thought content normal.        Judgment: Judgment normal.    Lab Results Lab Results  Component Value Date   WBC 5.2 09/21/2021   HGB 10.9 (L) 09/21/2021   HCT 31.7 (L) 09/21/2021   MCV 85.7 09/21/2021   PLT 373 09/21/2021    Lab Results  Component Value Date   CREATININE 0.69 09/21/2021   BUN 6 (L) 09/21/2021   NA 139 09/21/2021   K 3.8 09/21/2021   CL 104 09/21/2021   CO2 26 09/21/2021    Lab Results  Component Value Date   ALT 30 09/20/2021   AST 27 09/20/2021   GGT 374 (H) 08/17/2014   ALKPHOS 78 09/20/2021  BILITOT 0.6 09/20/2021     Microbiology: Recent Results (from the past 240 hour(s))  Resp Panel by RT-PCR (Flu A&B, Covid) Nasopharyngeal Swab     Status: None   Collection Time: 09/19/21 11:40 AM   Specimen: Nasopharyngeal Swab; Nasopharyngeal(NP) swabs in vial transport medium  Result Value Ref Range Status   SARS Coronavirus 2 by RT PCR NEGATIVE NEGATIVE Final    Comment: (NOTE) SARS-CoV-2 target nucleic acids are NOT DETECTED.  The SARS-CoV-2 RNA is generally detectable in upper respiratory specimens during the acute phase of infection. The lowest concentration of SARS-CoV-2 viral copies this assay can detect is 138 copies/mL. A negative result does not preclude SARS-Cov-2 infection and should not be used as the sole basis for treatment or other patient management decisions. A negative result may occur with  improper specimen  collection/handling, submission of specimen other than nasopharyngeal swab, presence of viral mutation(s) within the areas targeted by this assay, and inadequate number of viral copies(<138 copies/mL). A negative result must be combined with clinical observations, patient history, and epidemiological information. The expected result is Negative.  Fact Sheet for Patients:  BloggerCourse.com  Fact Sheet for Healthcare Providers:  SeriousBroker.it  This test is no t yet approved or cleared by the [place] FDA and  has been authorized for detection and/or diagnosis of SARS-CoV-2 by FDA under an Emergency Use Authorization (EUA). This EUA will remain  in effect (meaning this test can be used) for the duration of the COVID-19 declaration under Section 564(b)(1) of the Act, 21 U.S.C.section 360bbb-3(b)(1), unless the authorization is terminated  or revoked sooner.       Influenza A by PCR NEGATIVE NEGATIVE Final   Influenza B by PCR NEGATIVE NEGATIVE Final    Comment: (NOTE) The Xpert Xpress SARS-CoV-2/FLU/RSV plus assay is intended as an aid in the diagnosis of influenza from Nasopharyngeal swab specimens and should not be used as a sole basis for treatment. Nasal washings and aspirates are unacceptable for Xpert Xpress SARS-CoV-2/FLU/RSV testing.  Fact Sheet for Patients: BloggerCourse.com  Fact Sheet for Healthcare Providers: SeriousBroker.it  This test is not yet approved or cleared by the [place] FDA and has been authorized for detection and/or diagnosis of SARS-CoV-2 by FDA under an Emergency Use Authorization (EUA). This EUA will remain in effect (meaning this test can be used) for the duration of the COVID-19 declaration under Section 564(b)(1) of the Act, 21 U.S.C. section 360bbb-3(b)(1), unless the authorization is terminated or revoked.  Performed at [facility], [address], [place], [place] [address]   Urine Culture     Status: Abnormal   Collection Time: 09/19/21  2:21 PM   Specimen: Urine, Clean Catch  Result Value Ref Range Status   Specimen Description URINE, CLEAN CATCH  Final   Special Requests   Final    NONE Performed at [facility], [address] [place], [place] [address]    Culture >=100,000 COLONIES/mL KLEBSIELLA PNEUMONIAE (A)  Final   Report Status 09/22/2021 FINAL  Final   Organism ID, Bacteria KLEBSIELLA PNEUMONIAE (A)  Final      Susceptibility   Klebsiella pneumoniae - MIC*    AMPICILLIN >=32 RESISTANT Resistant     CEFAZOLIN <=4 SENSITIVE Sensitive     CEFEPIME <=0.12 SENSITIVE Sensitive     CEFTRIAXONE <=0.25 SENSITIVE Sensitive     CIPROFLOXACIN <=0.25 SENSITIVE Sensitive     GENTAMICIN <=1 SENSITIVE Sensitive     IMIPENEM 1 SENSITIVE Sensitive  NITROFURANTOIN 64 INTERMEDIATE Intermediate     TRIMETH/SULFA >=320 RESISTANT Resistant     AMPICILLIN/SULBACTAM 16 INTERMEDIATE Intermediate     PIP/TAZO <=4 SENSITIVE Sensitive     * >=100,000 COLONIES/mL KLEBSIELLA PNEUMONIAE     Terri Piedra, NP [facility]  09/22/2021  2:07 PM

## 2021-09-22 NOTE — Progress Notes (Addendum)
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 Patient had persistent neuro deficits with resolution of N/V and diaphoresis, so MRI was obtained showing a new stroke (not captured on initial CT).    Overnight Events: NAEON  Interim History: On interview and assessment the patient is alert and attempts to communicate. She is dysarthric and only able to say a few words. She denies pain or nausea/vomiting. The family is updated regarding the new diagnosis of ischemic stroke. All questions were answered.   OBJECTIVE:  Vital Signs: Vitals:   09/21/21 2117 09/22/21 0542 09/22/21 0939 09/22/21 1200  BP: 131/71 116/78 (!) 138/91 127/88  Pulse: 85 100 65 96  Resp: 18 18 16 18   Temp: 98.9 F (37.2 C) 98.3 F (36.8 C) 99.1 F (37.3 C) 98.1 F (36.7 C)  TempSrc: Oral  Oral Oral  SpO2: 98% 100% 100% 96%      Intake/Output Summary (Last 24 hours) at 09/22/2021 1354 Last data filed at 09/22/2021 1325 Gross per 24 hour  Intake 529.43 ml  Output 850 ml  Net -320.57 ml    Net IO Since Admission: 237.43 mL [09/22/21 1354]  Physical Exam: Physical Exam Vitals reviewed.  Cardiovascular:     Rate and Rhythm: Normal rate. Rhythm irregular.     Pulses: Normal pulses.     Heart sounds: No murmur heard. Pulmonary:     Effort: Pulmonary effort is normal.     Breath sounds: Normal breath sounds.  Abdominal:     General: Bowel sounds are normal.     Palpations: Abdomen is soft.     Tenderness: There is abdominal tenderness.  Neurological:     Mental Status: She is alert.    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 Patient had persistent neuro deficits with resolution of N/V and diaphoresis, so MRI was obtained showing a new stroke (not captured on initial CT).   #Vomiting, diaphoresis, malaise #Multifocal pyelonephritis (chronic?) MRI-Abdomen was notable for a 3.5 cm developing fluid collection on the upper pole of the right kidney, concern for abscess. Ucx growing 100K GNR, found to be Klebsiella, resistant to ampicillin, Bactrim, and Macrobid. Will switch to cefazolin. Patient appears well clinically. Urology consulted and will f/u as an outpatient. ID consulted for consideration of antibiotic duration. -ID consult, appreciate recs -Start cefazolin 2g q8hrs, with transition to cefadroxil at d/c -Dys 3 diet  #Atrial fibrillation #Hx L MCA stroke #New AICA cerebellar stroke CTH negative for acute stroke on admission. Patient had persistent dysarthria after resolution of other symptoms and start of antibiotics. MRI-brain obtained showing acute left AICA cerebellar infarct without hemorrhagic  transformation as well as small left PICA infarcts, indicative of embolic strokes per radiology.   -Neurology consult -SLP eval -CTA head and neck -Lipids and A1C -Continue Eliquis 5 mg BID -Lopressor 50 mg BID -Atorvastatin 20 mg   #HTN -Home Amlodipine 10 mg   #Chronic normocytic anemia Stable from last year, hgb at 10. Unknown last colonoscopy.   #Elevated troponin Trops flat from 33 to 42 to 34. Patient denies CP.  -no further workup   #Gallbladder wall mass (resolved) 0.9  cm mass on the gallbladder wall turned out to be a mucosal fold.  -No f/u needed  #Hypokalemia (resolved)   Dispo: Admit patient to Inpatient with expected length of stay greater than 2 midnights.  FEN/GI: dys 3 diet, no more IVF unless needed VTE: Eliquis Dispo: home, outpatient PT Code Status: FULL  Signature: Carlyn Reichert, MD PGY-1 Pager: [phone]  Please contact the on call pager after 5 pm and on weekends at [phone].

## 2021-09-22 NOTE — Progress Notes (Addendum)
Occupational Therapy Treatment Patient Details Name: Lindsey [name] MRN: [id number] DOB: December 21, 1955 Today's Date: 09/22/2021   History of present illness 65 y.o. female presents to [facility] hospital on 09/19/2021 with nausea/vomiting, diaphoresis, and malaise.  Pt admitted for management of R sided pyelonephritis with developing renal abscess. 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   OT comments  Pt received in bed with her grandson present. Pt was able to identify guest was her grandson and stated his name but was noted to have slurred speech. Pt was agreeable to OT session. Pt's brace that was noted from the evaluation is a R AFO, which was utilized during session. Pt currently required modA for bed mobility, modA for face-to-face stand pivot transfer to/from EOB<>recliner. Pt noted to have L hip weakness and increased knee buckling with attempts to step. Pt required assistance for weight shifting and stability in standing to attempt to take steps. Pt required maxA for LB dressing. Updated d/c recommendation this date, due to BLE weakness, instability, speech limitations and cognitive limitations, feel pt would greatly benefit from aggressive rehab to return to prior level functioning. Should pt return home with family she should continue to use equipment she has such as w/c, hospital bed, cane, BSC and will require 24/7 assistance from her family, recommend outpt services should pt return home.  Pt will continue to benefit from skilled OT services to maximize safety and independence with ADL/IADL and functional mobility. Will continue to follow acutely and progress as tolerated.     Recommendations for follow up therapy are one component of a multi-disciplinary discharge planning process, led by the attending physician.  Recommendations may be updated based on patient status, additional  functional criteria and insurance authorization.    Follow Up Recommendations  Acute inpatient rehab (3hours/day)    Assistance Recommended at Discharge Frequent or constant Supervision/Assistance  Equipment Recommendations  None recommended by OT    Recommendations for Other Services Rehab consult    Precautions / Restrictions Precautions Precautions: Fall Precaution Comments: R hemiplegia and global aphasia Required Braces or Orthoses:  (pt wears R AFO at baseline when ambulating) Restrictions Weight Bearing Restrictions: No       Mobility Bed Mobility Overal bed mobility: Needs Assistance Bed Mobility: Supine to Sit;Sit to Supine     Supine to sit: Mod assist;HOB elevated Sit to supine: Mod assist;HOB elevated   General bed mobility comments: modA to progress trunk to uprigh posture;assist for BLE management    Transfers Overall transfer level: Needs assistance Equipment used:  (1 person face to face transfer) Transfers: Sit to/from Stand;Bed to chair/wheelchair/BSC Sit to Stand: Mod assist Stand pivot transfers: Mod assist Step pivot transfers: Mod assist       General transfer comment: face to face transfer with assist to block Lknee;assist for weight shifting     Balance Overall balance assessment: Needs assistance Sitting-balance support: No upper extremity supported;Feet supported Sitting balance-Leahy Scale: Fair     Standing balance support: Single extremity supported Standing balance-Leahy Scale: Poor Standing balance comment: modA for stability in standing with weight shifting;noted Lknee buckling with weight shifting                           ADL either performed or assessed with clinical judgement   ADL Overall ADL's : Needs assistance/impaired Eating/Feeding: Minimal assistance   Grooming:  Minimal assistance   Upper Body Bathing: Moderate assistance   Lower Body Bathing: Moderate assistance;Sit to/from stand   Upper Body  Dressing : Moderate assistance;Sitting   Lower Body Dressing: Maximal assistance;Sit to/from stand Lower Body Dressing Details (indicate cue type and reason): assist to don/doff socks, AFO, shoes Toilet Transfer: Moderate assistance;Stand-pivot Toilet Transfer Details (indicate cue type and reason): from EOB to recliner, using R AFO;face to face transfer, assist for weight shifting and blocking Rknee Toileting- Clothing Manipulation and Hygiene: Moderate assistance;Sit to/from stand Toileting - Clothing Manipulation Details (indicate cue type and reason): to powerup into standing and for stability     Functional mobility during ADLs: Moderate assistance General ADL Comments: pt limited by cognition, BLE weakness, instability, decreased activity tolerance    Extremity/Trunk Assessment Upper Extremity Assessment Upper Extremity Assessment: RUE deficits/detail RUE Deficits / Details: hx of hemiplegia from previous CVA. Pt with flexion contracture. LUE Deficits / Details: overall WFL;grossly 3+/5;poor control when attempting to write, pt was right handed at baseline LUE Coordination: decreased fine motor   Lower Extremity Assessment Lower Extremity Assessment: RLE deficits/detail;LLE deficits/detail;Defer to PT evaluation RLE Deficits / Details: 3+/5 knee extension, 3/5 PF/DF;pt has AFO LLE Deficits / Details: noted decreased quad activation with weight shifting, noted increased knee buckling LLE;knee flexion/extension 3+/5;hip flexion 2/5; pt endorsed "yes" that LLE felt weaker this date   Cervical / Trunk Assessment Cervical / Trunk Assessment: Normal    Vision       Perception     Praxis      Cognition Arousal/Alertness: Awake/alert Behavior During Therapy: WFL for tasks assessed/performed Overall Cognitive Status: Difficult to assess Area of Impairment: Following commands                       Following Commands: Follows one step commands with increased time      Problem Solving: Slow processing;Decreased initiation;Requires verbal cues;Requires tactile cues General Comments: pt continues to reply in yes/no answers, pt able to correctly identify her [name] who was present during session. speech was slurred and difficult to understand. Pt with gross unintelligible speech. She attempted to write to communicate, pt was able to identify letters were "Carrer", unsure what pt was attempting to communicate.          Exercises     Shoulder Instructions       General Comments vss on RA    Pertinent Vitals/ Pain       Pain Assessment: No/denies pain Breathing: normal Negative Vocalization: none Facial Expression: smiling or inexpressive Body Language: tense, distressed pacing, fidgeting Consolability: no need to console PAINAD Score: 1 Pain Intervention(s): Monitored during session  Home Living Family/patient expects to be discharged to:: Private residence Living Arrangements: Children Available Help at Discharge: Family Type of Home: House Home Access: Level entry     Home Layout: Multi-level;Able to live on main level with bedroom/bathroom;1/2 bath on main level Alternate Level Stairs-Number of Steps: flight, chair lift   Bathroom Shower/Tub: Walk-in shower   Bathroom Toilet: Standard Bathroom Accessibility: Yes How Accessible: Accessible via walker Home Equipment: Shower seat;Wheelchair - manual;Cane - single Librarian, academic (2 wheels);Other (comment);Hospital bed          Prior Functioning/Environment              Frequency  Min 2X/week        Progress Toward Goals  OT Goals(current goals can now be found in the care plan section)  Progress towards OT goals:  Progressing toward goals  Acute Rehab OT Goals Patient Stated Goal: pt unable to state OT Goal Formulation: With patient Time For Goal Achievement: 10/05/21 Potential to Achieve Goals: Good ADL Goals Pt Will Perform Eating: with  set-up;sitting Pt Will Perform Grooming: with set-up;sitting Pt Will Transfer to Toilet: with min guard assist;ambulating  Plan      Co-evaluation                 AM-PAC OT "6 Clicks" Daily Activity     Outcome Measure   Help from another person eating meals?: A Little Help from another person taking care of personal grooming?: A Little Help from another person toileting, which includes using toliet, bedpan, or urinal?: A Lot Help from another person bathing (including washing, rinsing, drying)?: A Lot Help from another person to put on and taking off regular upper body clothing?: A Little Help from another person to put on and taking off regular lower body clothing?: A Lot 6 Click Score: 15    End of Session Equipment Utilized During Treatment: Gait belt;Other (comment) (AFO)  OT Visit Diagnosis: Other abnormalities of gait and mobility (R26.89);Muscle weakness (generalized) (M62.81);Other symptoms and signs involving cognitive function   Activity Tolerance Patient tolerated treatment well   Patient Left in bed;with bed alarm set;with call bell/phone within reach;with family/visitor present   Nurse Communication Mobility status        Time: [id number] OT Time Calculation (min): 47 min  Charges: OT General Charges $OT Visit: 1 Visit OT Treatments $Self Care/Home Management : 38-52 mins  Helene Kelp OTR/L Acute Rehabilitation Services Office: [place] 09/22/2021, 1:23 PM

## 2021-09-22 NOTE — Care Management Important Message (Signed)
Important Message  Patient Details  Name: Lindsey [name] MRN: [id number] Date of Birth: 11-21-1955   Medicare Important Message Given:  Yes     Sereen Schaff Stefan [place] 09/22/2021, 2:01 PM

## 2021-09-22 NOTE — Progress Notes (Addendum)
Physical Therapy Treatment Patient Details Name: Lindsey [name] MRN: [id number] DOB: September 09, 1956 Today's Date: 09/22/2021   History of Present Illness 65 y.o. female presents to [facility] hospital on 09/19/2021 with nausea/vomiting, diaphoresis, and malaise.  Pt admitted for management of R sided pyelonephritis with developing renal abscess. 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    PT Comments    Noted MRI findings of left AICA cerebellar infarct. Pt with increased left lower extremity functional weakness, which in combination with residual right weakness from prior stroke, resulted in increased assist for all aspects of mobility. Pt requiring min assist-mod assist for functional mobility. She was able to participate in pre gait training, but due to decreased left sided coordination, was unable to progress ambulation. Also, potential motor apraxia occurring as well. In light of new deficits, recommend AIR to address, maximize functional mobility and decrease caregiver burden.    Recommendations for follow up therapy are one component of a multi-disciplinary discharge planning process, led by the attending physician.  Recommendations may be updated based on patient status, additional functional criteria and insurance authorization.  Follow Up Recommendations  Acute inpatient rehab (3hours/day)     Assistance Recommended at Discharge Frequent or constant Supervision/Assistance  Equipment Recommendations  None recommended by PT    Recommendations for Other Services       Precautions / Restrictions Precautions Precautions: Fall Precaution Comments: R hemiplegia and global aphasia Required Braces or Orthoses:  (pt wears R AFO at baseline when ambulating) Restrictions Weight Bearing Restrictions: No     Mobility  Bed Mobility Overal bed mobility: Needs Assistance Bed  Mobility: Supine to Sit;Sit to Supine     Supine to sit: Mod assist;HOB elevated Sit to supine: Min assist   General bed mobility comments: assist for RLE and trunk management    Transfers Overall transfer level: Needs assistance Equipment used: Hemi-walker Transfers: Sit to/from Stand Sit to Stand: Min assist           General transfer comment: Pt stood to hemiwalker with minA, good initiation, utilizing narrow BOS    Ambulation/Gait Ambulation/Gait assistance: Mod assist Gait Distance (Feet): 2 Feet Assistive device: Hemi-walker Gait Pattern/deviations: Step-to pattern Gait velocity: decreased     General Gait Details: Pt taking ~2 steps forward and back x 2, advancing RLE forwards initially, but unable to advance LLE due to incoordination   Stairs             Wheelchair Mobility    Modified Rankin (Stroke Patients Only) Modified Rankin (Stroke Patients Only) Pre-Morbid Rankin Score: Moderately severe disability Modified Rankin: Moderately severe disability     Balance Overall balance assessment: Needs assistance Sitting-balance support: No upper extremity supported;Feet supported Sitting balance-Leahy Scale: Fair     Standing balance support: Single extremity supported Standing balance-Leahy Scale: Poor                              Cognition Arousal/Alertness: Awake/alert Behavior During Therapy: WFL for tasks assessed/performed Overall Cognitive Status: Difficult to assess Area of Impairment: Following commands                       Following Commands: Follows one step commands with increased time     Problem Solving: Slow processing;Decreased initiation;Requires verbal cues;Requires tactile cues General Comments: pt continues to reply  in yes/no answers, pt able to correctly identify her [name] who was present during session. speech was slurred and difficult to understand. Pt with gross unintelligible speech.         Exercises      General Comments        Pertinent Vitals/Pain Pain Assessment: No/denies pain    Home Living Family/patient expects to be discharged to:: Private residence Living Arrangements: Children Available Help at Discharge: Family Type of Home: House Home Access: Level entry     Alternate Level Stairs-Number of Steps: flight, chair lift Home Layout: Multi-level;Able to live on main level with bedroom/bathroom;1/2 bath on main level Home Equipment: Shower seat;Wheelchair - manual;Cane - single Librarian, academic (2 wheels);Other (comment);Hospital bed      Prior Function            PT Goals (current goals can now be found in the care plan section) Acute Rehab PT Goals Patient Stated Goal: to return to prior level of function Potential to Achieve Goals: Fair    Frequency    Min 4X/week      PT Plan Discharge plan needs to be updated;Frequency needs to be updated    Co-evaluation              AM-PAC PT "6 Clicks" Mobility   Outcome Measure  Help needed turning from your back to your side while in a flat bed without using bedrails?: A Little Help needed moving from lying on your back to sitting on the side of a flat bed without using bedrails?: A Lot Help needed moving to and from a bed to a chair (including a wheelchair)?: A Lot Help needed standing up from a chair using your arms (e.g., wheelchair or bedside chair)?: A Little Help needed to walk in hospital room?: Total Help needed climbing 3-5 steps with a railing? : Total 6 Click Score: 12    End of Session Equipment Utilized During Treatment: Gait belt Activity Tolerance: Patient tolerated treatment well Patient left: in bed;with call bell/phone within reach;with bed alarm set;with family/visitor present Nurse Communication: Mobility status PT Visit Diagnosis: Other abnormalities of gait and mobility (R26.89);Muscle weakness (generalized) (M62.81);Other symptoms and signs involving  the nervous system (R29.898);Hemiplegia and hemiparesis Hemiplegia - Right/Left: Right Hemiplegia - dominant/non-dominant: Dominant Hemiplegia - caused by: Cerebral infarction     Time: 1431-1449 PT Time Calculation (min) (ACUTE ONLY): 18 min  Charges:  $Therapeutic Activity: 8-22 mins                     Lillia Pauls, PT, DPT Acute Rehabilitation Services Pager [phone] Office [phone]    Norval Morton 09/22/2021, 5:11 PM

## 2021-09-22 NOTE — TOC Initial Note (Signed)
Transition of Care [facility]) - Initial/Assessment Note    Patient Details  Name: Lindsey [name] MRN: [id number] Date of Birth: [birth year]  [facility]) CM/SW Contact:    Tom-Johnson, Renea Ee, RN Phone Number: 09/22/2021, 4:04 PM  Clinical Narrative:                 CM spoke with patient at bedside about needs for post hospital transition. Rae Roam at bedside. Admitted for Pyelonephritis and found to have a Stroke. Patient is aphasic and gave CM permission to speak with daughter, Lindsey [name]. CM called and spoke with Lindsey [name] [phone]). Lindsey [name] states patient had a previous stroke which left her weak on her left side and also aphasic. States patient lives with her at home and has a walker, wheelchair, bedside commode and shower chair. CM notified her that CIR is recommended for patient and states someone from CIR had called her and talked to her about rehab and are in agreement for patient to go to rehab. CIR has placed consult for full assessment. CM awaits their response. CM will continue to follow with needs.     Barriers to Discharge: Continued Medical Work up   Patient Goals and CMS Choice Patient states their goals for this hospitalization and ongoing recovery are:: To go home CMS Medicare.gov Compare Post Acute Care list provided to:: Patient Choice offered to / list presented to : Patient, Adult Children (Daughter, [name])  Expected Discharge Plan and Services     Discharge Planning Services: CM Consult Post Acute Care Choice: IP Rehab Living arrangements for the past 2 months: Single Family Home                                      Prior Living Arrangements/Services Living arrangements for the past 2 months: Single Family Home Lives with:: Adult Children Lindsey [name]) Patient language and need for interpreter reviewed:: Yes Do you feel safe going back to the place where you live?: Yes      Need for Family Participation in Patient Care: Yes  (Comment) Care giver support system in place?: Yes (comment) Current home services: DME [facility] Rile, wheelchair, BSC, shower chair.) Criminal Activity/Legal Involvement Pertinent to Current Situation/Hospitalization: No - Comment as needed  Activities of Daily Living      Permission Sought/Granted Permission sought to share information with : Case Manager, Customer service manager, Family Supports Permission granted to share information with : Yes, Verbal Permission Granted              Emotional Assessment Appearance:: Appears stated age Attitude/Demeanor/Rapport: Engaged, Gracious Affect (typically observed): Accepting, Appropriate, Calm, Hopeful Orientation: : Oriented to Self, Oriented to Place, Oriented to  Time, Oriented to Situation Alcohol / Substance Use: Not Applicable Psych Involvement: No (comment)  Admission diagnosis:  Pyelonephritis [N12] Abnormal CT of the abdomen [R93.5] RUQ abdominal mass [R19.01] Altered mental status, unspecified altered mental status type [R41.82] Patient Active Problem List   Diagnosis Date Noted   Pyelonephritis 09/19/2021   Pain due to onychomycosis of toenails of both feet 11/19/2020   Anemia 05/31/2020   Acute ischemic left MCA stroke ([place]) 04/30/2020   Right hemiplegia ([place]) 04/30/2020   CVA (cerebral vascular accident) ([place]) 04/21/2020   Unspecified atrial fibrillation ([place]) 04/21/2020   Essential hypertension 04/21/2020   Alcohol abuse 04/21/2020   PCP:  Donnie Coffin, MD Pharmacy:   [facility] [address] [place] - [phone]  [address] [place] [address] Phone: [phone] Fax: [phone]  [facility] [address] [place] [address] Phone: [phone] Fax: [phone]  [facility] [address] [place], [place] - [address] [place] [address] Phone: [phone] Fax: [phone]     Social Determinants of  Health (SDOH) Interventions    Readmission Risk Interventions No flowsheet data found.

## 2021-09-22 NOTE — Progress Notes (Signed)
Inpatient Rehab Admissions Coordinator Note:   Per updated OT recommendations patient was screened for CIR candidacy by Stephania Fragmin, PT. At this time, pt appears to be a potential candidate for CIR. I will place an order for rehab consult for full assessment, per our protocol.  Please contact me any with questions.Estill Dooms, PT, DPT [phone] 09/22/21 3:54 PM

## 2021-09-23 ENCOUNTER — Inpatient Hospital Stay (HOSPITAL_COMMUNITY): Payer: Medicare Other

## 2021-09-23 DIAGNOSIS — I6340 Cerebral infarction due to embolism of unspecified cerebral artery: Secondary | ICD-10-CM | POA: Insufficient documentation

## 2021-09-23 DIAGNOSIS — I4891 Unspecified atrial fibrillation: Secondary | ICD-10-CM

## 2021-09-23 DIAGNOSIS — E785 Hyperlipidemia, unspecified: Secondary | ICD-10-CM

## 2021-09-23 DIAGNOSIS — I6389 Other cerebral infarction: Secondary | ICD-10-CM

## 2021-09-23 LAB — LIPID PANEL
Cholesterol: 113 mg/dL (ref 0–200)
HDL: 42 mg/dL (ref >40)
LDL Cholesterol: 61 mg/dL (ref 0–99)
Total CHOL/HDL Ratio: 2.7 RATIO
Triglycerides: 49 mg/dL (ref <150)
VLDL: 10 mg/dL (ref 0–40)

## 2021-09-23 LAB — ECHOCARDIOGRAM COMPLETE
AR max vel: 2.13 cm2
AV Area VTI: 2.08 cm2
AV Area mean vel: 2.03 cm2
AV Mean grad: 2 mmHg
AV Peak grad: 3.3 mmHg
Ao pk vel: 0.9 m/s
Area-P 1/2: 4.03 cm2
Height: 64 in
S' Lateral: 3 cm
Weight: 2640 oz

## 2021-09-23 LAB — BASIC METABOLIC PANEL
Anion gap: 14 (ref 5–15)
BUN: 8 mg/dL (ref 8–23)
CO2: 21 mmol/L — ABNORMAL LOW (ref 22–32)
Calcium: 9.5 mg/dL (ref 8.9–10.3)
Chloride: 99 mmol/L (ref 98–111)
Creatinine, Ser: 0.79 mg/dL (ref 0.44–1.00)
GFR, Estimated: >60 mL/min (ref >60)
Glucose, Bld: 135 mg/dL — ABNORMAL HIGH (ref 70–99)
Potassium: 3.9 mmol/L (ref 3.5–5.1)
Sodium: 134 mmol/L — ABNORMAL LOW (ref 135–145)

## 2021-09-23 LAB — CBC
HCT: 38.6 % (ref 36.0–46.0)
Hemoglobin: 13.1 g/dL (ref 12.0–15.0)
MCH: 28.9 pg (ref 26.0–34.0)
MCHC: 33.9 g/dL (ref 30.0–36.0)
MCV: 85.2 fL (ref 80.0–100.0)
Platelets: 423 10*3/uL — ABNORMAL HIGH (ref 150–400)
RBC: 4.53 MIL/uL (ref 3.87–5.11)
RDW: 14.6 % (ref 11.5–15.5)
WBC: 5.5 10*3/uL (ref 4.0–10.5)
nRBC: 0 % (ref 0.0–0.2)

## 2021-09-23 LAB — HEMOGLOBIN A1C
Hgb A1c MFr Bld: 5.7 % — ABNORMAL HIGH (ref 4.8–5.6)
Mean Plasma Glucose: 116.89 mg/dL

## 2021-09-23 LAB — GLUCOSE, CAPILLARY: Glucose-Capillary: 121 mg/dL — ABNORMAL HIGH (ref 70–99)

## 2021-09-23 IMAGING — CT CT HEAD W/O CM
2 of 3 series · 14 of 47 positions shown, 17 images · non-contrast
Comparison: [DATE].

CLINICAL DATA: Stroke, possible hemorrhagic conversion.

EXAM:
CT HEAD WITHOUT CONTRAST
TECHNIQUE: Contiguous axial images were obtained from the base of the skull
through the vertex without intravenous contrast.

[Series 4: head 5.0 h30s · axial · 0.48mm/px · z∈[-153,-8]mm · 11 of 35 slices shown, 14 images]
[im 3/35  brain]
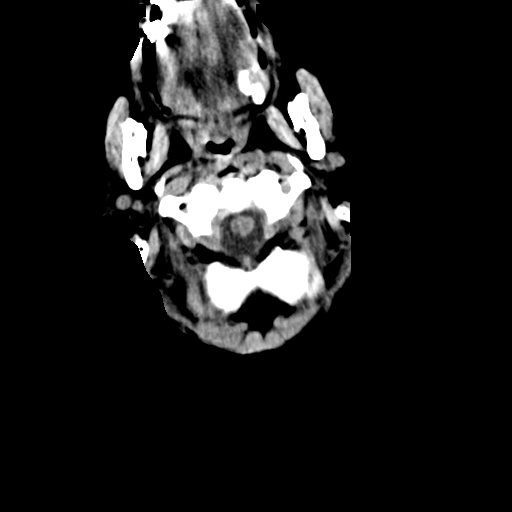
[im 3/35  bone]
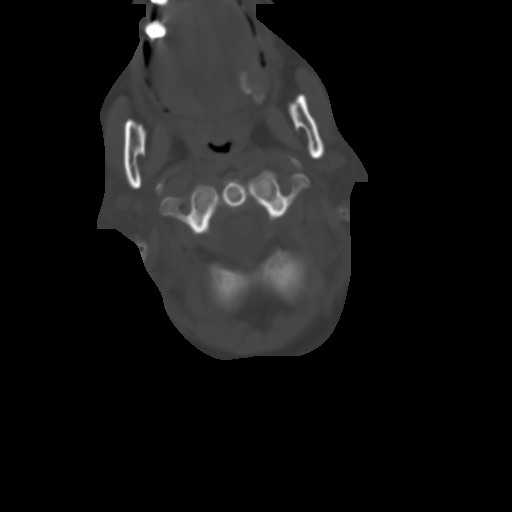
[im 5/35  brain]
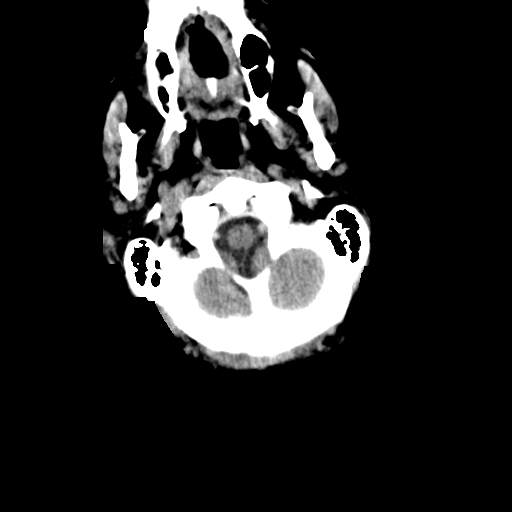
[im 9/35  brain]
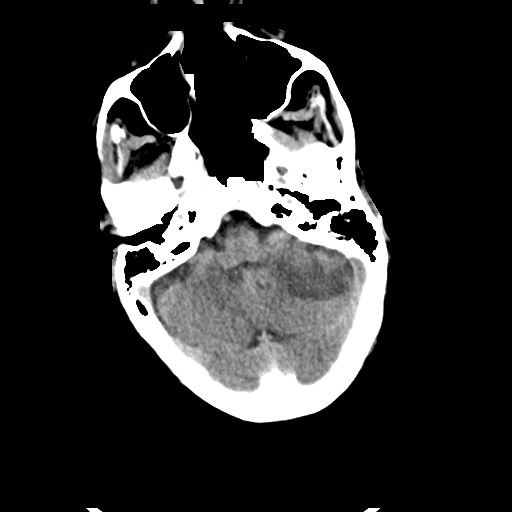
[im 11/35  brain]
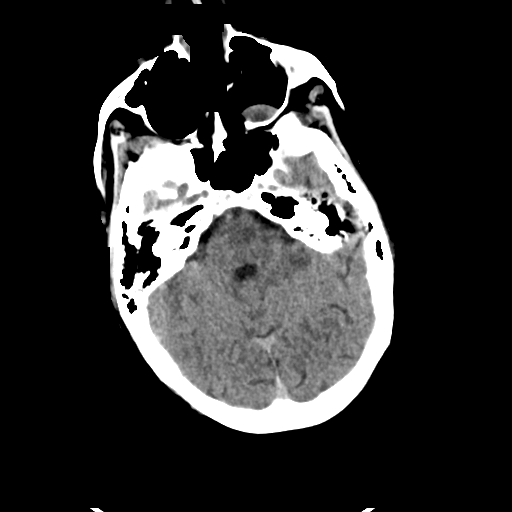
[im 15/35  brain]
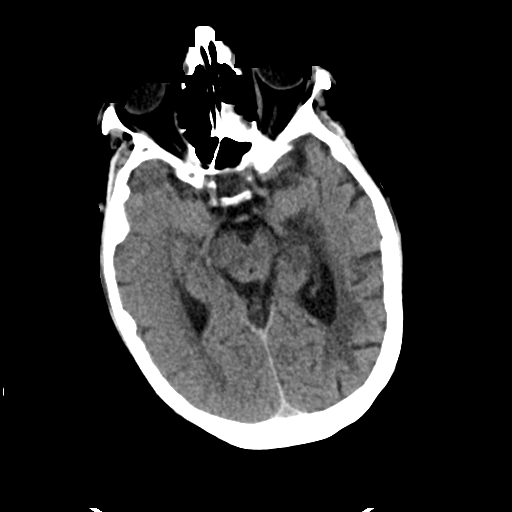
[im 15/35  bone]
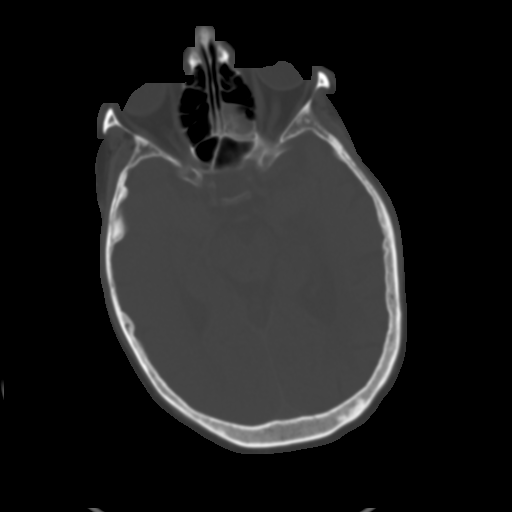
[im 18/35  brain]
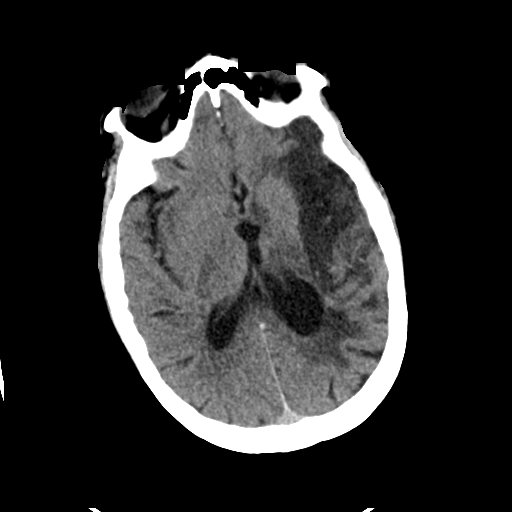
[im 20/35  brain]
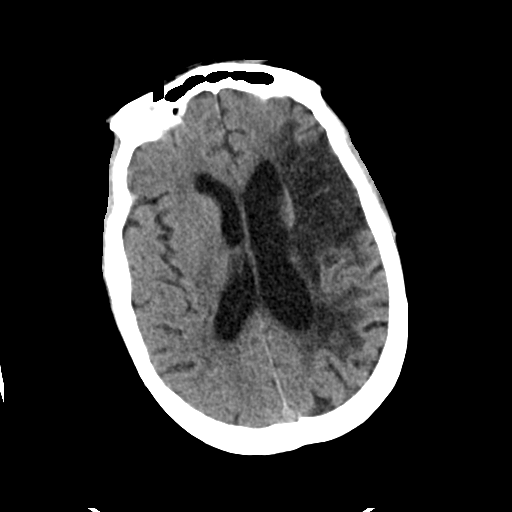
[im 24/35  brain]
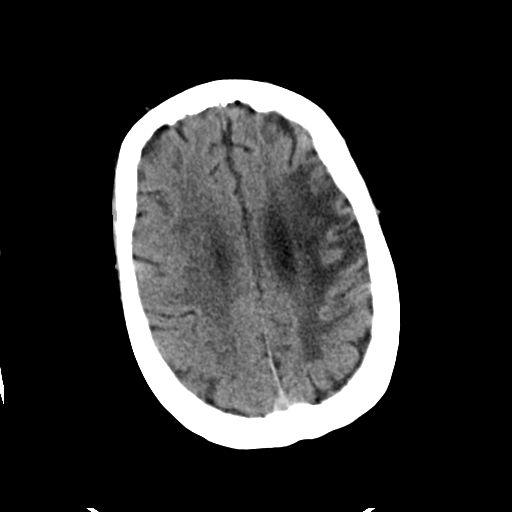
[im 26/35  brain]
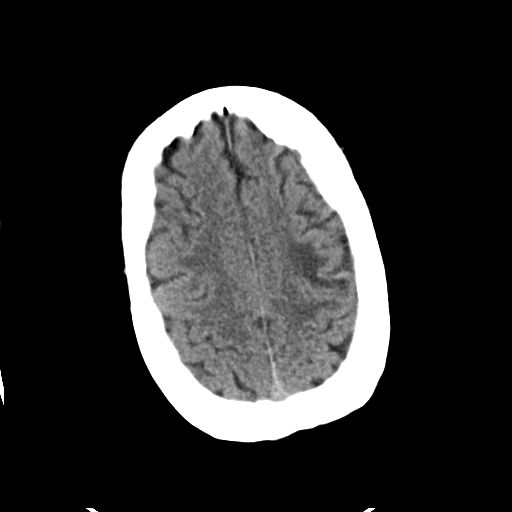
[im 26/35  bone]
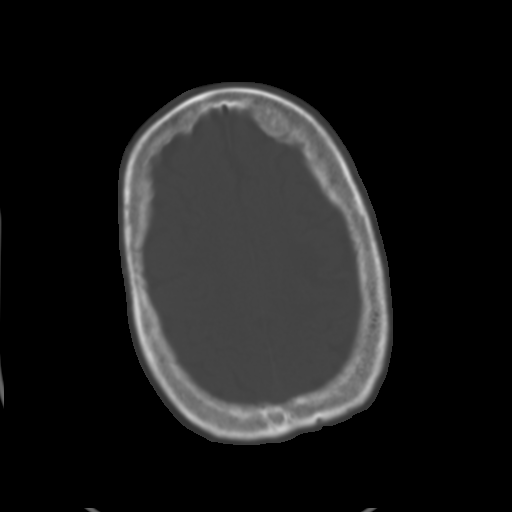
[im 30/35  brain]
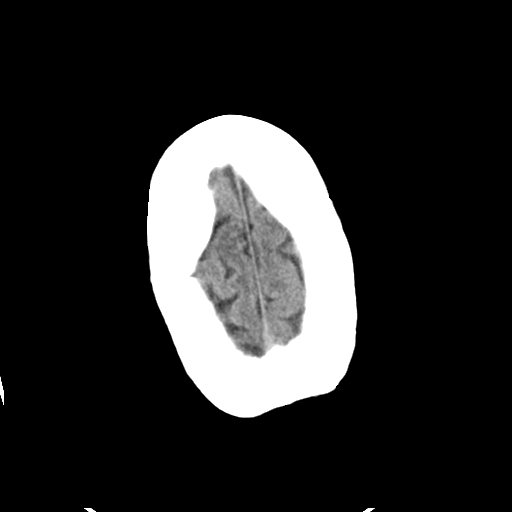
[im 32/35  brain]
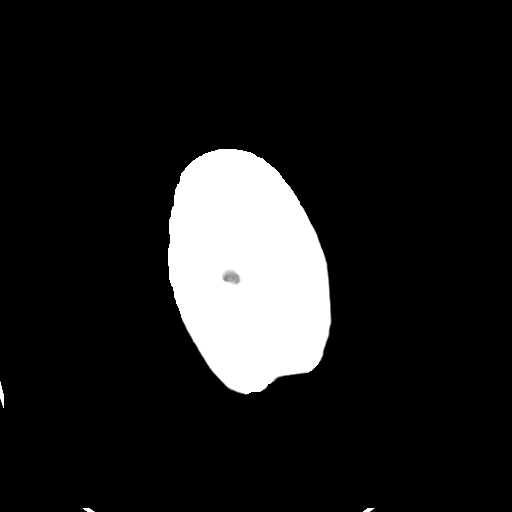

[Series 5: head 3.0 mpr cor · coronal · 0.34mm/px · 3 of 73 slices shown]
[im 27/73  brain]
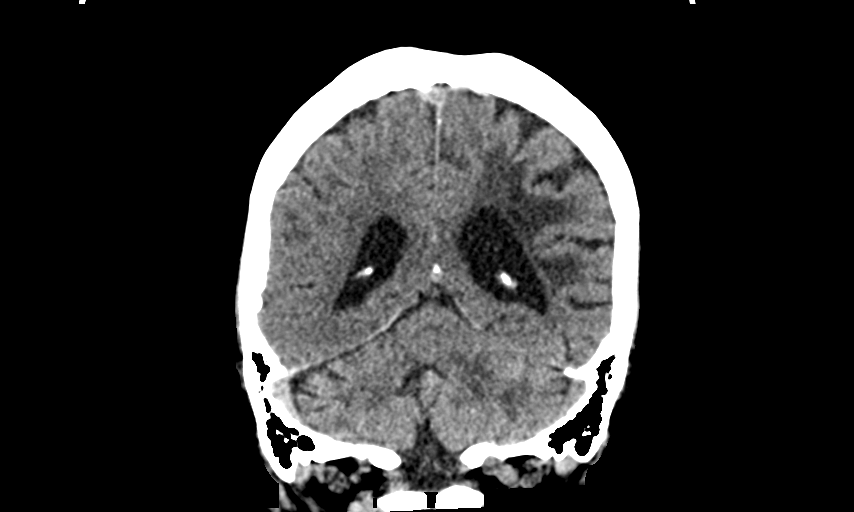
[im 33/73  brain]
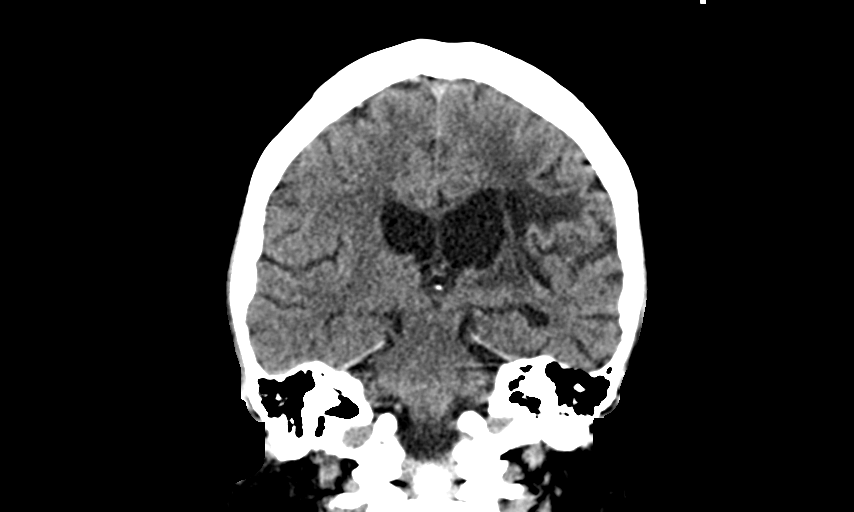
[im 40/73  brain]
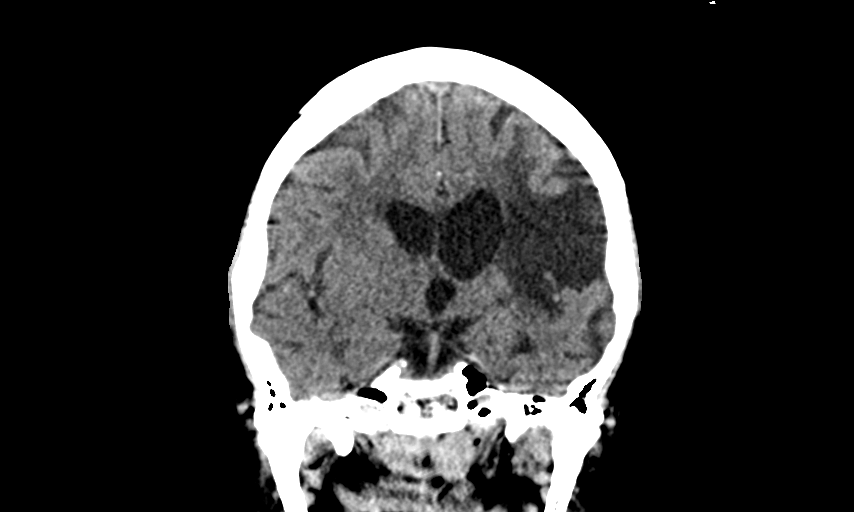

[14 of 47 positions shown; findings below may reference images not displayed]

FINDINGS: Brain: No acute intracranial hemorrhage, midline shift or mass
effect. No extra-axial fluid collection. There is redemonstration of
hypodense attenuation and encephalomalacia region in the MCA
territory on the left compatible with old infarct. A hypodense
region is noted in the left cerebellar hemisphere, not significantly
changed from the prior exam. Extensive subcortical and
periventricular white matter hypodensities are noted bilaterally.
There is no hydrocephalus.

Vascular: Atherosclerotic calcification of the carotid siphons. No
hyperdense vessel.

Skull: Normal. Negative for fracture or focal lesion.

Sinuses/Orbits: There is partial opacification of the ethmoid air
cells and maxillary sinuses bilaterally. The orbits are stable.

Other: None.
IMPRESSION: 1. Hypodense region in the left cerebellar hemisphere, compatible
with known acute infarct. No acute hemorrhage is identified.
2. Stable old infarct in the MCA territory on the left.
3. Atrophy with chronic microvascular ischemic changes.

## 2021-09-23 MED ORDER — LEVETIRACETAM IN NACL 1500 MG/100ML IV SOLN
1500.0000 mg | Freq: Once | INTRAVENOUS | Status: AC
  Administered 2021-09-23: 02:00:00 1500 mg via INTRAVENOUS
  Filled 2021-09-23: qty 100

## 2021-09-23 MED ORDER — CEFAZOLIN SODIUM-DEXTROSE 2-4 GM/100ML-% IV SOLN
2.0000 g | Freq: Three times a day (TID) | INTRAVENOUS | Status: DC
  Administered 2021-09-23 (×2): 2 g via INTRAVENOUS
  Filled 2021-09-23 (×3): qty 100

## 2021-09-23 MED ORDER — METOPROLOL TARTRATE 5 MG/5ML IV SOLN
5.0000 mg | Freq: Four times a day (QID) | INTRAVENOUS | Status: DC
  Administered 2021-09-23: 14:00:00 5 mg via INTRAVENOUS
  Filled 2021-09-23: qty 5

## 2021-09-23 MED ORDER — METOPROLOL TARTRATE 50 MG PO TABS
50.0000 mg | ORAL_TABLET | Freq: Two times a day (BID) | ORAL | Status: DC
  Administered 2021-09-23 – 2021-09-24 (×3): 50 mg via ORAL
  Filled 2021-09-23 (×3): qty 1

## 2021-09-23 MED ORDER — KETOROLAC TROMETHAMINE 15 MG/ML IJ SOLN
15.0000 mg | Freq: Once | INTRAMUSCULAR | Status: AC
  Administered 2021-09-23: 05:00:00 15 mg via INTRAVENOUS
  Filled 2021-09-23: qty 1

## 2021-09-23 MED ORDER — LEVETIRACETAM IN NACL 500 MG/100ML IV SOLN
500.0000 mg | Freq: Two times a day (BID) | INTRAVENOUS | Status: DC
  Administered 2021-09-23 (×2): 500 mg via INTRAVENOUS
  Filled 2021-09-23 (×2): qty 100

## 2021-09-23 MED ORDER — LEVOFLOXACIN 750 MG PO TABS
750.0000 mg | ORAL_TABLET | Freq: Every day | ORAL | Status: DC
  Administered 2021-09-23 – 2021-09-24 (×2): 750 mg via ORAL
  Filled 2021-09-23 (×2): qty 1

## 2021-09-23 MED ORDER — LEVETIRACETAM 500 MG PO TABS
500.0000 mg | ORAL_TABLET | Freq: Two times a day (BID) | ORAL | Status: DC
  Administered 2021-09-24: 11:00:00 500 mg via ORAL
  Filled 2021-09-23: qty 1

## 2021-09-23 MED ORDER — APIXABAN 5 MG PO TABS
5.0000 mg | ORAL_TABLET | Freq: Two times a day (BID) | ORAL | Status: DC
  Administered 2021-09-23 – 2021-09-24 (×3): 5 mg via ORAL
  Filled 2021-09-23 (×3): qty 1

## 2021-09-23 NOTE — Progress Notes (Signed)
On call team called to bedside by the nursing staff. Patient was noted to have staring episodes and foaming at the mouth with concern for seizure. In addition she was no longer having purposeful movement on her left side for the team.   On assessment patient did not follow commands, however, neurologist at bedside did note patient had purposeful movements with physical stimuli (patient touched her nose when a q tip was placed in it and moved her L hand to her eyes when saline was placed in her eyes).   Because of the purposeful movement on neurology exam there was low concern for active seizure. Patient will be loaded with IV keppra, stat CT head will be obtained due to concern for possible hemorrhagic conversion, and EEG will be obtained in the AM per neuro.

## 2021-09-23 NOTE — Progress Notes (Signed)
Physical Therapy Treatment Patient Details Name: Lindsey [name] MRN: [id number] DOB: 11-03-1955 Today's Date: 09/23/2021   History of Present Illness 65 y.o. female presents to [facility] hospital on 09/19/2021 with nausea/vomiting, diaphoresis, and malaise.  Pt admitted for management of R sided pyelonephritis with developing renal abscess. 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    PT Comments    Pt received in supine, aphasic but able to answer some yes/no questions and participatory in therapy session as able. Pt following most multimodal cues with increased time but has difficulty initiating hip flexion, especially on LLE and unable to weight shift/step with LLE in stance today even with maxA and manual assist to offload L leg and with Rt knee blocked for stability. Pt denies dizziness and HR/BP stable. Pt reports only minimal fatigue at end of session. Pt continues to benefit from PT services to progress toward functional mobility goals.    Recommendations for follow up therapy are one component of a multi-disciplinary discharge planning process, led by the attending physician.  Recommendations may be updated based on patient status, additional functional criteria and insurance authorization.  Follow Up Recommendations  Acute inpatient rehab (3hours/day)     Assistance Recommended at Discharge Frequent or constant Supervision/Assistance  Equipment Recommendations  None recommended by PT    Recommendations for Other Services       Precautions / Restrictions Precautions Precautions: Fall Precaution Comments: R hemiplegia and global aphasia Required Braces or Orthoses:  (pt wears R AFO at baseline when ambulating) Restrictions Weight Bearing Restrictions: No     Mobility  Bed Mobility Overal bed mobility: Needs Assistance Bed Mobility: Supine to Sit;Sit to Supine      Supine to sit: Mod assist;HOB elevated Sit to supine: Mod assist   General bed mobility comments: assist for RLE and trunk management    Transfers Overall transfer level: Needs assistance Equipment used: 1 person hand held assist Transfers: Sit to/from Stand Sit to Stand: Min assist;Mod assist           General transfer comment: Pt stood via face to face posture x2 trials with improved upright tolerance, x1 rep using U UE support (back of chair). Pt denies dizziness in stance    Ambulation/Gait Ambulation/Gait assistance: Mod assist   Assistive device: 1 person hand held assist Gait Pattern/deviations: Step-to pattern;Narrow base of support     Pre-gait activities: pt with posterior lean and able to step forward/sidestep with RLE but unable to offload LLE for stepping even wtih face to face maxA for weight shifting pt to LLE with knee blocked for stability. pt with possible LLE motor apraxia? difficulty also with some LLE seated exercise commands (hip flexion)     Stairs             Wheelchair Mobility    Modified Rankin (Stroke Patients Only) Modified Rankin (Stroke Patients Only) Pre-Morbid Rankin Score: Moderately severe disability Modified Rankin: Severe disability     Balance Overall balance assessment: Needs assistance Sitting-balance support: No upper extremity supported;Feet supported Sitting balance-Leahy Scale: Fair     Standing balance support: Single extremity supported Standing balance-Leahy Scale: Poor Standing balance comment: maxA for static standing via face to face posture and B feet blocked for safety, pt able to advance RLE but not LLE for stepping today  Cognition Arousal/Alertness: Awake/alert Behavior During Therapy: WFL for tasks assessed/performed Overall Cognitive Status: Difficult to assess Area of Impairment: Following commands                       Following Commands: Follows  one step commands with increased time     Problem Solving: Slow processing;Decreased initiation;Requires verbal cues;Requires tactile cues General Comments: pt continues to reply in yes/no answers; speech was slurred and difficult to understand. Pt with gross unintelligible speech. motor apraxia?        Exercises Other Exercises Other Exercises: seated BLE AAROM: LAQ, hip flexion, LLE ankle pumps x10 reps ea Other Exercises: STS x3 trials for BLE strengthening    General Comments General comments (skin integrity, edema, etc.): BP taken seated/supine and stable 106/81 seated and 109/72 supine; HR ~100 bpm throughout per tele      Pertinent Vitals/Pain Pain Assessment: Faces Faces Pain Scale: No hurt Pain Intervention(s): Monitored during session;Repositioned    Home Living                          Prior Function            PT Goals (current goals can now be found in the care plan section) Acute Rehab PT Goals Patient Stated Goal: to return to prior level of function PT Goal Formulation: With family Time For Goal Achievement: 10/05/21 Progress towards PT goals: Progressing toward goals (slow progress, noted recent possible seizure activity previous night)    Frequency    Min 4X/week      PT Plan Discharge plan needs to be updated;Frequency needs to be updated    Co-evaluation              AM-PAC PT "6 Clicks" Mobility   Outcome Measure  Help needed turning from your back to your side while in a flat bed without using bedrails?: A Little Help needed moving from lying on your back to sitting on the side of a flat bed without using bedrails?: A Lot (mod cues) Help needed moving to and from a bed to a chair (including a wheelchair)?: Total Help needed standing up from a chair using your arms (e.g., wheelchair or bedside chair)?: A Lot Help needed to walk in hospital room?: Total Help needed climbing 3-5 steps with a railing? : Total 6 Click Score:  10    End of Session Equipment Utilized During Treatment: Gait belt Activity Tolerance: Patient tolerated treatment well Patient left: in bed;with call bell/phone within reach;with bed alarm set;Other (comment) (HOB elevated >30 deg as pt had recently been drinking soda, pt requesting lights out) Nurse Communication: Mobility status;Other (comment);Need for lift equipment (use mechanical lift for OOB) PT Visit Diagnosis: Other abnormalities of gait and mobility (R26.89);Muscle weakness (generalized) (M62.81);Other symptoms and signs involving the nervous system (R29.898);Hemiplegia and hemiparesis Hemiplegia - Right/Left: Right Hemiplegia - dominant/non-dominant: Dominant Hemiplegia - caused by: Cerebral infarction     Time: 1741-1816 PT Time Calculation (min) (ACUTE ONLY): 35 min  Charges:  $Therapeutic Exercise: 8-22 mins $Therapeutic Activity: 8-22 mins                     Joshawa Dubin P., PTA Acute Rehabilitation Services Pager: [phone] Office: [phone]    Angus Palms 09/23/2021, 6:32 PM

## 2021-09-23 NOTE — Progress Notes (Signed)
CMT called notified that patient HR was 180's when I arrived in the room patient was having seizure like behavior blank stare and not responding the foaming at the mouth vital signs done charted in Epic. MD notified and Rapid response and charge RN notified. Ilean Skill LPN

## 2021-09-23 NOTE — Plan of Care (Addendum)
Brief Neuro Update:  Patient had a possible seizure overnight. Per RN, tele notified her that patient was tachycardic. On evaluation, patient found starring off in space, unresponsive and foaming at her mouth. Patient was turned to her side. The episode resolved spontaneously in a minute.   Earlier despite aphasia, patient was able to follow simple commands on the left and is plegic in RUE and RLE from prior L MCA stroke. She does not follow commands on my evaluation. Eyes are midline with extraoccular movements intact on dolls eyes. She moves LUE and LLE spontaneously and localizes with LUE and LLE. She is plegic in RUE and RLE.  I suspect that the episode was likely a seizures and she is post ictal right now. No clinical activity consistent with seizure noted on my evaluation.  Recs: - I ordered Keppra load of 1500mg  IV once and started Keppra 500mg  BID - Low threshold to put her on cEEG. - CTH without contrast. She was on Eliquis which was stopped yesterday. - I ordered Routine EEG in AM.  [facility] Pager Number 

## 2021-09-23 NOTE — Procedures (Signed)
Patient Name: Lindsey [name]  MRN: [id number]  Epilepsy Attending: Charlsie Quest  Referring Physician/Provider: Dr Erick Blinks Date: 09/23/2021 Duration: 23.57 mins  Patient history: 65 year old female with seizure-like episode.  EEG to evaluate for seizure.  Level of alertness: Awake, asleep  AEDs during EEG study: LEV  Technical aspects: This EEG study was done with scalp electrodes positioned according to the 10-20 International system of electrode placement. Electrical activity was acquired at a sampling rate of 500Hz  and reviewed with a high frequency filter of 70Hz  and a low frequency filter of 1Hz . EEG data were recorded continuously and digitally stored.   Description: The posterior dominant rhythm consists of 8 Hz activity of moderate voltage (25-35 uV) seen predominantly in posterior head regions, asymmetric ( left<right) and reactive to eye opening and eye closing. Sleep was characterized by vertex waves, sleep spindles (12 to 14 Hz), maximal frontocentral region.  EEG showed continuous 3 to 5 Hz theta Parenta slowing in left hemisphere.  Hyperventilation and photic stimulation were not performed.     ABNORMALITY -Continued slow, left hemisphere  IMPRESSION: This study is suggestive of cortical dysfunction in left hemisphere, nonspecific etiology but could be secondary to underlying structural abnormality, postictal state.  No seizures or epileptiform discharges were seen throughout the recording.  If suspicion for ictal-interictal activity persists, please consider prolonged EEG.  Elvert Cumpton 

## 2021-09-23 NOTE — Evaluation (Signed)
Speech Language Pathology Evaluation Patient Details Name: Lindsey [name] MRN: [id number] DOB: 11/23/1955 Today's Date: 09/23/2021 Time: 0102-7253 SLP Time Calculation (min) (ACUTE ONLY): 20 min  Problem List:  Patient Active Problem List   Diagnosis Date Noted   Pyelonephritis 09/19/2021   Pain due to onychomycosis of toenails of both feet 11/19/2020   Anemia 05/31/2020   Acute ischemic left MCA stroke (HCC) 04/30/2020   Right hemiplegia (HCC) 04/30/2020   CVA (cerebral vascular accident) (HCC) 04/21/2020   Unspecified atrial fibrillation (HCC) 04/21/2020   Essential hypertension 04/21/2020   Alcohol abuse 04/21/2020   Past Medical History:  Past Medical History:  Diagnosis Date   Diverticulosis    Hypertension    Uterine prolapse    Past Surgical History: History reviewed. No pertinent surgical history. HPI:  Pt is a 65 y.o. female who presents to [facility] hospital on 09/19/2021 with nausea/vomiting, diaphoresis, and malaise.  Pt admitted for management of R sided pyelonephritis with developing renal abscess. 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 12/22: Pt having staring episodes and foaming at mouth concering for seizure. EEG results pending. MBS (04/28/20) revealed mod oral dysphagia, with no aspiration noted, however coughing with thins via straw observed post MBS. Dys 2, thin liquids (no straw) recommended at that time and at discharge from CIR (05/28/20). She has been seen by OP SLP services for cognitive-linguistic functions (aphasia, apraxia), with most recent discharge 09/01/21 and report of pt effectively communicating with Lingraphica device. 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 Spontaneous intelligible verbalizations this  date were limited to "yes". Verbal apraxic errors were consistently observed during confrontational naming and repetition task, with pt struggling/groping for words. Simple 1 step commands were followed with 50% accuracy and improved with min visual/demonstration cues. Basic biographical and environmental questions answered with 75% accuracy, although pt frequently verbalized "yes" and shook head "no" at the same time. Repetition of question often clarified pt's answer. Cognition difficult to assess due to language and motor speech deficits. Pt would benefit from SLP f/u to further address cognitive-linguistic functions during acute stay.    SLP Assessment  SLP Recommendation/Assessment: Patient needs continued Speech Lanaguage Pathology Services SLP Visit Diagnosis: Apraxia (R48.2);Aphasia (R47.01)    Recommendations for follow up therapy are one component of a multi-disciplinary discharge planning process, led by the attending physician.  Recommendations may be updated based on patient status, additional functional criteria and insurance authorization.    Follow Up Recommendations  Other (comment) (TBD)    Assistance Recommended at Discharge  Other (comment) (TBD)  Functional Status Assessment Patient has had a recent decline in their functional status and demonstrates the ability to make significant improvements in function in a reasonable and predictable amount of time.  Frequency and Duration min 2x/week  2 weeks      SLP Evaluation Cognition  Overall Cognitive Status: Difficult to assess Orientation Level: Oriented to person       Comprehension  Auditory Comprehension Overall Auditory Comprehension: Impaired Yes/No Questions: Impaired Basic Biographical Questions: 51-75% accurate Basic Immediate Environment Questions: 50-74% accurate Complex Questions: 0-24% accurate Commands: Impaired One Step Basic Commands: 25-49% accurate Interfering Components: Motor  planning EffectiveTechniques: Repetition;Extra processing time Visual Recognition/Discrimination Discrimination: Not tested Reading Comprehension Reading Status: Not tested    Expression Expression Primary Mode of  Expression: Verbal Verbal Expression Overall Verbal Expression: Impaired Initiation: No impairment Automatic Speech: Social Response Level of Generative/Spontaneous Verbalization: Word Repetition: Impaired Level of Impairment: Word level Naming: Impairment Confrontation: Impaired Written Expression Dominant Hand: Right Written Expression: Not tested   Oral / Motor  Oral Motor/Sensory Function Overall Oral Motor/Sensory Function: Mild impairment Facial ROM: Within Functional Limits Facial Symmetry: Abnormal symmetry right;Suspected CN VII (facial) dysfunction Lingual ROM: Within Functional Limits Lingual Symmetry: Within Functional Limits Motor Speech Overall Motor Speech: Impaired Respiration: Within functional limits Phonation: Normal Resonance: Within functional limits Motor Planning: Impaired Level of Impairment: Word Motor Speech Errors: Groping for words;Unaware;Consistent           Avie Echevaria, MA, CCC-SLP Acute Rehabilitation Services Office Number: [phone]  Paulette Blanch 09/23/2021, 12:53 PM

## 2021-09-23 NOTE — Progress Notes (Addendum)
HD#4 SUBJECTIVE:  Patient Summary: Lindsey [name] is a 65 year old female with a PMHx of L MCA stroke with resulting R hemiplegia and aphasia, as well as atrial fibrillation and HTN, presenting with vomiting, diaphoresis, and malaise, found to have multifocal pyelonephritis. Patient had persistent neuro deficits with resolution of N/V and diaphoresis, so MRI was obtained, showing a new stroke (not captured on initial CT).    Overnight Events: Patient was noted to be tachycardic, RN went to assess patient and found her foaming at the mouth, staring off, and unresponsive. Night team and neuro went to assess patient. They were concerned for seizure. CTH was obtained showing no hemorrhagic transformation. Patient was keppra loaded and scheduled for an EEG.  Interim History: On interview and assessment the patient is alert and responds to questions with head nods. She does not vocalize. She is able to follow some simple commands. Much time is spent with the daughter at the bedside. She shares her frustration that an MRI brain was not immediately obtained. All questions were answered.   OBJECTIVE:  Vital Signs: Vitals:   09/23/21 0106 09/23/21 0341 09/23/21 0819 09/23/21 1125  BP: (!) 143/92 (!) 132/94 (!) 143/95 (!) 141/95  Pulse: (!) 108 (!) 41 70 73  Resp:  14 16 14   Temp:  97.8 F (36.6 C) 98 F (36.7 C) 98.1 F (36.7 C)  TempSrc:   Oral Oral  SpO2: 97% 91%  100%  Weight:      Height:          Intake/Output Summary (Last 24 hours) at 09/23/2021 1436 Last data filed at 09/23/2021 1300 Gross per 24 hour  Intake 372.91 ml  Output 2000 ml  Net -1627.09 ml    Net IO Since Admission: -1,389.66 mL [09/23/21 1436]  Physical Exam: Physical Exam Vitals reviewed.  Cardiovascular:     Rate and Rhythm: Normal rate. Rhythm irregular.     Pulses: Normal pulses.     Heart sounds: No murmur heard. Pulmonary:     Effort: Pulmonary effort is normal.     Breath sounds: Normal breath  sounds.  Abdominal:     General: Bowel sounds are normal.     Palpations: Abdomen is soft.     Tenderness: There is abdominal tenderness.  Neurological:     Mental Status: She is alert.     Comments: Patient responds with head nods, does not spontaneously produce speech. Strength 4/5 LUE, 3/5 LLE, plegic on R side, Unable to test sensation Patient unable to participate in testing of EOM, cranial nerves    Patient Lines/Drains/Airways Status     Active Line/Drains/Airways     Name Placement date Placement time Site Days   Peripheral IV 09/19/21 20 G Left Antecubital 09/19/21  1124  Antecubital  1   Peripheral IV 09/20/21 22 G 1" Right Hand 09/20/21  0258  Hand  less than 1   External Urinary Catheter 09/19/21  1201  --  1             ASSESSMENT/PLAN:  Assessment: Lindsey [name] is a 65 year old female with a PMHx of L MCA stroke with resulting R hemiplegia and aphasia, as well as atrial fibrillation and HTN, presenting with vomiting, diaphoresis, and malaise, found to have multifocal pyelonephritis. Patient had persistent neuro deficits with resolution of N/V and diaphoresis, so MRI was obtained showing a new stroke (not captured on initial CT).   #Atrial fibrillation #Hx L MCA stroke with R sided hemiplegia  and aphasia #New AICA cerebellar stroke and PICA infarcts #Potential seizure CTH negative for acute stroke on admission. Patient had persistent dysarthria after resolution of other symptoms and start of antibiotics. MRI-brain obtained showing acute left AICA cerebellar infarct without hemorrhagic transformation as well as small left PICA infarcts, indicative of embolic strokes. Patient had a potential seizure overnight as described above. EEG with no evidence of seizure or epileptiform discharges. SLP eval with rec for regular diet. CTA with patent vasculature.  Risk stratification labs notable for A1C of 5.7 and a normal lipid panel.  -Neurology consult, appreciate recs -SLP  follow up for both swallow and language -Continue Keppra 500 mg q12hrs -Restart Eliquis 5 mg BID, per neuro -Lopressor 50 mg BID -Atorvastatin 20 mg daily  #Vomiting, diaphoresis, malaise #Multifocal pyelonephritis (chronic?) MRI-Abdomen was notable for a 3.5 cm developing fluid collection on the upper pole of the right kidney, concern for abscess. Ucx growing 100K GNR, found to be Klebsiella, resistant to ampicillin, Bactrim, and Macrobid. Patient without fever or leukocytosis. Urology consulted and will f/u as an outpatient. -ID consult, appreciate recs -Change cefazolin to Levaquin for at least a 4-week course -Repeat imagine in 2-3 weeks -ID follow up on Jan 16th with Dr. Drue Second  #HTN -Home Amlodipine 10 mg   #Chronic normocytic anemia Stable from last year, hgb at 10. Unknown last colonoscopy.   #Elevated troponin Trops flat from 33 to 42 to 34. Patient denies CP.  -no further workup   #Gallbladder wall mass (resolved) 0.9 cm mass on the gallbladder wall turned out to be a mucosal fold.  -No f/u needed  #Hypokalemia (resolved)    FEN/GI: regular VTE: Eliquis Dispo: CIR Code Status: FULL  Signature: Carlyn Reichert, MD PGY-1 Pager: [phone]  Please contact the on call pager after 5 pm and on weekends at [phone].

## 2021-09-23 NOTE — Progress Notes (Signed)
EEG complete - results pending 

## 2021-09-23 NOTE — Progress Notes (Signed)
STROKE TEAM PROGRESS NOTE   INTERVAL HISTORY Her daughter is at the bedside. Per her daughter, at baseline Ms. Britten does have paralysis of her right side and significant aphasia.  From her prior stroke at baseline.  Her daughter found her having an episode of staring, she was also sweaty.  On admission patient was nauseous, diaphoretic, and lethargic, she was found to have a multifocal pyelonephritis and is now on antibiotics per the infectious disease team.  She does have a history of A. fib and she is on Eliquis at home.  This has been resumed along with aspirin 81 mg.  Imaging of the brain showed a new stroke as well as her previous stroke from 2021.  She had another episode of staring and diaphoresis last night which was felt to be seizure by the staff and evaluated by neuro hospitalist and was loaded with Keppra.  EEG ordered for this morning and CT head without contrast was completed.  Speech therapy is at the bedside to do a swallow screen and an aphasia assessment.  MRI scan shows left superior cerebellar artery embolic infarct and old large left MCA infarct with encephalomalacia.  CT angiogram shows possible stenosis of left superior cerebellar artery  CBC:  Recent Labs  Lab 09/21/21 0221 09/23/21 0233  WBC 5.2 5.5  HGB 10.9* 13.1  HCT 31.7* 38.6  MCV 85.7 85.2  PLT 373 [id number]*   Basic Metabolic Panel:  Recent Labs  Lab 09/21/21 0221 09/23/21 0233  NA 139 134*  K 3.8 3.9  CL 104 99  CO2 26 21*  GLUCOSE 97 135*  BUN 6* 8  CREATININE 0.69 0.79  CALCIUM 9.3 9.5   Lipid Panel:  Recent Labs  Lab 09/23/21 0233  CHOL 113  TRIG 49  HDL 42  CHOLHDL 2.7  VLDL 10  LDLCALC 61   HgbA1c:  Recent Labs  Lab 09/23/21 0233  HGBA1C 5.7*   Urine Drug Screen: No results for input(s): LABOPIA, COCAINSCRNUR, LABBENZ, AMPHETMU, THCU, LABBARB in the last 168 hours.  Alcohol Level No results for input(s): ETH in the last 168 hours.  IMAGING past 24 hours CT ANGIO HEAD NECK W WO  CM  Result Date: 09/22/2021 CLINICAL DATA:  Stroke follow-up EXAM: CT ANGIOGRAPHY HEAD AND NECK TECHNIQUE: Multidetector CT imaging of the head and neck was performed using the standard protocol during bolus administration of intravenous contrast. Multiplanar CT image reconstructions and MIPs were obtained to evaluate the vascular anatomy. Carotid stenosis measurements (when applicable) are obtained utilizing NASCET criteria, using the distal internal carotid diameter as the denominator. CONTRAST:  89mL OMNIPAQUE IOHEXOL 350 MG/ML SOLN COMPARISON:  MR head obtained earlier the same day, noncontrast CT head 09/19/2021 FINDINGS: CT HEAD FINDINGS Brain: Is hypodensity in the left cerebellar hemisphere consistent with evolving infarct in the SCA distribution. Petechial hemorrhage seen on the prior brain MRI is not well appreciated on the current study. There is no evidence of hematoma formation. There is no significant regional mass effect, and the fourth ventricle remains patent The remote infarct in the left MCA distribution and associated wallerian degeneration extending into the brainstem are again seen. There is unchanged ex vacuo dilatation of the left lateral ventricle. The ventricles are otherwise normal in size. A remote infarct in the left PICA distribution and additional remote infarct in the left occipital lobe are unchanged. Vascular: See below. Skull: Normal. Negative for fracture or focal lesion. Sinuses and orbits: There is mucosal thickening and partial opacification of posterior left  and anterior right ethmoid air cells. The globes and orbits are unremarkable. Other: None. Review of the MIP images confirms the above findings CTA NECK FINDINGS Aortic arch: Is minimal calcified atherosclerotic plaque in the thoracic aorta. The origins of the major branch vessels are patent. There is no evidence of dissection to the level imaged. Right carotid system: There is a medialized course of the right common  and proximal internal carotid artery. There is minimal calcified atherosclerotic plaque at the right carotid bulb without hemodynamically significant stenosis or occlusion. There is no dissection or aneurysm. Left carotid system: There is a medialized course of the left common and proximal internal carotid artery. There is primarily calcified atherosclerotic plaque at the left carotid bulb resulting in less than 50% stenosis. There is no dissection or aneurysm. Vertebral arteries: The left vertebral artery is dominant, a normal variant. The vertebral arteries are patent, without hemodynamically significant stenosis, occlusion, dissection, or aneurysm. Skeleton: There is minimal degenerative change of the cervical spine. There is no visible canal hematoma. There is no acute osseous abnormality or aggressive osseous lesion. Other neck: A few prominent retropectoral lymph nodes bilaterally are not significantly changed since 04/21/2020, nonspecific. The soft tissues are otherwise unremarkable. Upper chest: The imaged lung apices are clear. Review of the MIP images confirms the above findings CTA HEAD FINDINGS Anterior circulation: There is minimal calcified atherosclerotic plaque in the intracranial ICAs without significant stenosis or occlusion. The bilateral MCAs are patent. The bilateral ACAs are patent. The anterior communicating artery is patent. There is no aneurysm. Posterior circulation: The bilateral V4 segments are patent, left larger than right. PICA origins are patent bilaterally. The superior cerebellar arteries are patent proximally bilaterally. There is focal stenosis and near occlusion of the left SCA (12-135), but the ICA appears patent distal to this point. The basilar artery is patent The bilateral PCAs are patent. A posterior communicating artery is identified on the right. The left PCOM is not definitely seen. There is no aneurysm. Venous sinuses: Not well assessed due to contrast timing. Anatomic  variants: None. Review of the MIP images confirms the above findings IMPRESSION: 1. Expected evolution of the acute to early subacute infarct in the left SCA distribution without evidence of malignant hemorrhagic transformation. No significant regional mass effect or effacement of the fourth ventricle. 2. 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 Otherwise, patent intracranial vasculature with no other hemodynamically significant stenosis or occlusion. 3. Mild calcified atherosclerotic plaque in the left carotid bulb and minimal plaque in the right carotid bulb without hemodynamically significant stenosis or occlusion. Patent vertebral arteries. 4. Unchanged remote infarcts in the left cerebral and cerebellar hemispheres. Electronically Signed   By: Lesia Hausen M.D.   On: 09/22/2021 14:50   CT HEAD WO CONTRAST ( )  Result Date: 09/23/2021 CLINICAL DATA:  Stroke, possible hemorrhagic conversion. EXAM: CT HEAD WITHOUT CONTRAST TECHNIQUE: Contiguous axial images were obtained from the base of the skull through the vertex without intravenous contrast. COMPARISON:  09/22/2021. FINDINGS: Brain: No acute intracranial hemorrhage, midline shift or mass effect. No extra-axial fluid collection. There is redemonstration of hypodense attenuation and encephalomalacia region in the MCA territory on the left compatible with old infarct. A hypodense region is noted in the left cerebellar hemisphere, not significantly changed from the prior exam. Extensive subcortical and periventricular white matter hypodensities are noted bilaterally. There is no hydrocephalus. Vascular: Atherosclerotic calcification of the carotid siphons. No hyperdense vessel. Skull: Normal.  Negative for fracture or focal lesion. Sinuses/Orbits: There is partial opacification of the ethmoid air cells and maxillary sinuses bilaterally. The orbits are stable. Other: None. IMPRESSION: 1. Hypodense  region in the left cerebellar hemisphere, compatible with known acute infarct. No acute hemorrhage is identified. 2. Stable old infarct in the MCA territory on the left. 3. Atrophy with chronic microvascular ischemic changes. Electronically Signed   By: Brett Fairy M.D.   On: 09/23/2021 02:06    PHYSICAL EXAM Frail middle-aged African-American lady not in distress. . Afebrile. Head is nontraumatic. Neck is supple without bruit.    Cardiac exam no murmur or gallop. Lungs are clear to auscultation. Distal pulses are well felt.  Neurological Exam :  She is awake alert globally aphasic.  She can follow very simple midline and one-step commands.  She makes some guttural noises but is unable to speak the words or sentences.  She blinks to threat more on the left than the right.  Extraocular movements appear full range without nystagmus.  Right lower facial weakness.  Tongue midline.  Motor system exam shows right hemiparesis with grade 1/5 strength on the right side and good strength antigravity on the left.  Sensation appears slightly diminished on the right compared to left.  Gait not tested. ASSESSMENT/PLAN [name] is a 65 y.o. female with history of left MCA stroke with right-sided deficits and aphasia, diverticulosis, atrial fibrillation, and hypertension presenting with nausea, vomiting, malaise who was initially admitted for multifocal pyelonephritis. Initial CTH negative for stroke. However, patient still with worsened aphasia and chronic right arm hemiplegia after resolution of other neuro deficits. MRI-brain obtained showing acute left AICA cerebellar infarct without hemorrhagic transformation as well as small left PICA infarcts, indicative of embolic strokes and neurology was consulted for further evaluation.  Stroke:  left cerebellar infarct likely secondary to occlusion of L SCA  CT head -hypodense region in the left cerebellar hemisphere.  Stable old infarct in the left MCA  territory CTA head & neck expected evolution of the acute to early subacute infarct in the left SCA distribution.  No evidence of malignant hemorrhagic transformation.  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  Therapy recommendations: Pending Disposition: Pending  Possible Seizure Activity Possible seizure activity overnight 12/22 Keppra 1500 mg loading dose then 500 twice daily EEG ordered results pending  Hypertension Home meds: Amlodipine 5 mg restarted IV Lopressor 5 mg every 6 hours Stable Permissive hypertension (OK if < 220/120) but gradually normalize in 5-7 days Long-term BP goal normotensive  Atrial fibrillation Eliquis 5 mg twice daily Cardiac monitoring  Hyperlipidemia Home meds:  Atorvastatin 20mg , resumed in hospital LDL 61, goal < 70  Continue statin at discharge  Other Stroke Risk Factors Advanced Age >/= 5  Hx stroke/TIA Left MCA infarct 2021 Left PICA cerebellar infarcts Completed therapy outpatient, was walking with a cane prior to this admission Residual right hemiplegia and aphasia Coronary artery disease Migraines Obstructive sleep apnea, on CPAP at home Congestive heart failure  Other Active Problems Pyelonephritis-Klebsiella Cefazolin 2 g every 8 hours-primary team managing ID consulted  Hospital day # 4  Patient seen and examined by NP/APP with MD. MD to update note as needed.   Janine Ores, DNP, FNP-BC  [facility] Pager: [phone] STROKE MD NOTE : I have personally obtained history,examined this patient, reviewed notes, independently viewed imaging studies, participated in medical decision making and plan of care.ROS completed by  me personally and pertinent positives fully documented  I have made any additions or clarifications directly to the above note. Agree with note above.  Patient with baseline aphasia and hemiplegia presented with worsening gait in the setting of multifocal pyelonephritis and MRI shows left superior cerebral artery embolic infarct.  Patient has A. fib and is already on anticoagulation with Eliquis I do not believe switching to alternative anticoagulant has necessarily been shown to be superior to continue Eliquis 5 mg twice daily.  Physical occupational and speech therapy consults.  Check echocardiogram.  Check blood cultures and if positive may need to look for endocarditis on TEE.  On discussion patient and daughter at bedside and answered questions.  Greater than 50% time during  this 35-minute visit spent on counseling and coordination of care about embolic stroke, atrial fibrillation, UTI discussion about evaluation and treatment plan.  Antony Contras, MD Medical Director [facility] Pager: [phone] 09/23/2021 3:36 PM  To contact Stroke Continuity provider, please refer to [email or web address] After hours, contact General Neurology

## 2021-09-23 NOTE — PMR Pre-admission (Signed)
PMR Admission Coordinator Pre-Admission Assessment  Patient: Lindsey [name] is an 65 y.o., female MRN: [id number] DOB: July 27, 1956 Height: 5' 4"  (162.6 cm) Weight: 74.8 kg  Insurance Information HMO:     PPO:      PCP:      IPA:      80/20:      OTHER:  PRIMARY: Medicare A and B      Policy#: [id number]      Subscriber: pt CM Name:       Phone#:      Fax#:  Pre-Cert#: verified Civil engineer, contracting:  Benefits:  Phone #:      Name:  Eff. Date: 03/03/21 A and B     Deduct: $1556 (2023 $1600)      Out of Pocket Max: n/a      Life Max: n/a CIR: 100%      SNF: 20 full days Outpatient: 80%     Co-Ins: 20% Home Health: 100%      Co-Pay:  DME: 80%     Co-Ins: 20% Providers:  SECONDARY: Medicaid of [place]      Policy#: [id number]     Phone#: [phone]  Financial Counselor:       Phone#:   The Data Collection Information Summary for patients in Inpatient Rehabilitation Facilities with attached Privacy Act [place] Records was provided and verbally reviewed with: Family  Emergency Contact Information Contact Information     Name Relation Home Work Mobile   [place] Daughter [phone]  [phone]   Claybon Jabs   [phone]       Current Medical History  Patient Admitting Diagnosis: CVA   History of Present Illness: Pt is a 65 y/o female with 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  Daughter is primary caregiver and provides history in ED.  Workup revealed pyleonephritis for which she was started on rocephin.  While admitted pt with multiple episodes of potential seizure.  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  EEG with no evidence of seizure or epileptiform discahrges.  CTA with patent vasculature.  Neurology recommended blood cultures and possible TEE (would be completed on CIR if needed) to r/o endocarditis but pt already on abx.  Loaded with Keppra then  500 mg BID.  Therapy ongoing and pt was recommended for CIR.   Complete NIHSS TOTAL: 16  Patient's medical record from [facility] has been reviewed by the rehabilitation admission coordinator and physician.  Past Medical History  Past Medical History:  Diagnosis Date   Diverticulosis    Hypertension    Uterine prolapse     Has the patient had major surgery during 100 days prior to admission? No  Family History   family history is not on file.  Current Medications  Current Facility-Administered Medications:    amLODipine (NORVASC) tablet 5 mg, 5 mg, Oral, Daily, Virl Axe, MD, 5 mg at 09/24/21 1125   apixaban (ELIQUIS) tablet 5 mg, 5 mg, Oral, BID, Dareen Piano, Nischal, MD, 5 mg at 09/24/21 1126   aspirin EC tablet 81 mg, 81 mg, Oral, Daily, Jones, Jessica L, NP, 81 mg at 09/24/21 1124   atorvastatin (LIPITOR) tablet 20 mg, 20 mg, Oral, q1800, Virl Axe, MD, 20 mg at 09/23/21 1653   levETIRAcetam (KEPPRA) IVPB 500 mg/100 mL premix, 500 mg, Intravenous, Q12H, Last Rate: 400 mL/hr at 09/23/21 2212, 500 mg at 09/23/21  2212 **OR** levETIRAcetam (KEPPRA) tablet 500 mg, 500 mg, Oral, Q12H, Lorrin Goodell, Salman, MD, 500 mg at 09/24/21 1125   levofloxacin (LEVAQUIN) tablet 750 mg, 750 mg, Oral, Daily, Corky Sox, MD, 750 mg at 09/24/21 1126   metoprolol tartrate (LOPRESSOR) tablet 50 mg, 50 mg, Oral, BID, Corky Sox, MD, 50 mg at 09/24/21 1126   ondansetron [facility]) tablet 4 mg, 4 mg, Oral, Q12H PRN, Corky Sox, MD  Patients Current Diet:  Diet Order             Diet regular Room service appropriate? No; Fluid consistency: Thin  Diet effective now                   Precautions / Restrictions Precautions Precautions: Fall Precaution Comments: R hemiplegia and global aphasia Restrictions Weight Bearing Restrictions: No   Has the patient had 2 or more falls or a fall with injury in the past year? No  Prior Activity Level Household: able to ambulate  household distances with [facility] and supervision, assist for ADLs from daughter  Prior Functional Level Self Care: Did the patient need help bathing, dressing, using the toilet or eating? Needed some help  Indoor Mobility: Did the patient need assistance with walking from room to room (with or without device)? Needed some help  Stairs: Did the patient need assistance with internal or external stairs (with or without device)? Dependent  Functional Cognition: Did the patient need help planning regular tasks such as shopping or remembering to take medications? Needed some help  Patient Information Are you of Hispanic, Latino/a,or Spanish origin?: A. No, not of Hispanic, Latino/a, or Spanish origin (all questions answered by daughter) What is your race?: B. Black or African American Do you need or want an interpreter to communicate with a doctor or health care staff?: 0. No  Patient's Response To:  Health Literacy and Transportation Is the patient able to respond to health literacy and transportation needs?: No Health Literacy - How often do you need to have someone help you when you read instructions, pamphlets, or other written material from your doctor or pharmacy?: Patient unable to respond In the past 12 months, has lack of transportation kept you from medical appointments or from getting medications?: No (per dtr) In the past 12 months, has lack of transportation kept you from meetings, work, or from getting things needed for daily living?: No  Development worker, international aid / [place]: Civil engineer, contracting, Wheelchair - manual, Radio producer - single point, Conservation officer, nature (2 wheels), Other (comment), Hospital bed  Prior Device Use: Indicate devices/aids used by the patient prior to current illness, exacerbation or injury? Manual wheelchair, Walker, Orthotics/Prosthetics, and cane  Current Functional Level Cognition  Overall Cognitive Status: Difficult to assess Difficult to assess due to:  Impaired communication Orientation Level: Oriented to person Following Commands: Follows one step commands with increased time General Comments: aphasic but able to respond to yes/no questions    Extremity Assessment (includes Sensation/Coordination)  Upper Extremity Assessment: RUE deficits/detail RUE Deficits / Details: hx of hemiplegia from previous CVA. Pt with flexion contracture. LUE Deficits / Details: overall WFL;grossly 3+/5;poor control when attempting to write, pt was right handed at baseline LUE Coordination: decreased fine motor  Lower Extremity Assessment: RLE deficits/detail, LLE deficits/detail, Defer to PT evaluation RLE Deficits / Details: 3+/5 knee extension, 3/5 PF/DF;pt has AFO LLE Deficits / Details: noted decreased quad activation with weight shifting, noted increased knee buckling LLE;knee flexion/extension 3+/5;hip flexion 2/5; pt endorsed "yes" that LLE  felt weaker this date    ADLs  Overall ADL's : Needs assistance/impaired Eating/Feeding: Minimal assistance Grooming: Wash/dry face, Wash/dry hands, Minimal assistance, Standing Grooming Details (indicate cue type and reason): performed standing at sink with verbal cues for sequencing and min assist for balance Upper Body Bathing: Moderate assistance Lower Body Bathing: Moderate assistance, Sit to/from stand Upper Body Dressing : Moderate assistance, Sitting Lower Body Dressing: Maximal assistance, Sit to/from stand Lower Body Dressing Details (indicate cue type and reason): Patient was ablet to assist with doffing socks and and mod assist to donn socks with max assist for shoes Toilet Transfer: Moderate assistance, Buyer, retail Details (indicate cue type and reason): from EOB to recliner, using R AFO;face to face transfer, assist for weight shifting and blocking Rknee Toileting- Clothing Manipulation and Hygiene: Moderate assistance, Sit to/from stand Toileting - Clothing Manipulation Details  (indicate cue type and reason): to powerup into standing and for stability Functional mobility during ADLs: Moderate assistance General ADL Comments: verbal cues for sequencing and increased time to follow commands    Mobility  Overal bed mobility: Needs Assistance Bed Mobility: Supine to Sit Supine to sit: Mod assist, HOB elevated Sit to supine: Mod assist General bed mobility comments: assist for RLE and trunk management    Transfers  Overall transfer level: Needs assistance Equipment used: Hemi-walker Transfers: Sit to/from Stand, Bed to chair/wheelchair/BSC Sit to Stand: Min assist, Mod assist Bed to/from chair/wheelchair/BSC transfer type:: Step pivot Stand pivot transfers: Mod assist Step pivot transfers: Mod assist General transfer comment: hemi walker used for transfer.  +2 provided to assist with sit to stand but patient was able to stand with min/mod assist    Ambulation / Gait / Stairs / Wheelchair Mobility  Ambulation/Gait Ambulation/Gait assistance: Mod assist Gait Distance (Feet): 2 Feet Assistive device: 1 person hand held assist Gait Pattern/deviations: Step-to pattern, Narrow base of support General Gait Details: Pt taking ~2 steps forward and back x 2, advancing RLE forwards initially, but unable to advance LLE due to incoordination Gait velocity: decreased Gait velocity interpretation: <1.31 ft/sec, indicative of household ambulator Pre-gait activities: pt with posterior lean and able to step forward/sidestep with RLE but unable to offload LLE for stepping even wtih face to face maxA for weight shifting pt to LLE with knee blocked for stability. pt with possible LLE motor apraxia? difficulty also with some LLE seated exercise commands (hip flexion)    Posture / Balance Dynamic Sitting Balance Sitting balance - Comments: able to assist with donning footwear while sitting on EOB Balance Overall balance assessment: Needs assistance Sitting-balance support: No upper  extremity supported, Feet supported Sitting balance-Leahy Scale: Fair Sitting balance - Comments: able to assist with donning footwear while sitting on EOB Standing balance support: Single extremity supported Standing balance-Leahy Scale: Poor Standing balance comment: able to stand at sink for grooming with min assist for balance with sink providing support    Special needs/care consideration Continuous Drip IV  cefazolin 2g (269m/hr); Keppra 500 mg (4060mhr)   Previous Home Environment (from acute therapy documentation) Living Arrangements: Children  Lives With: Daughter Available Help at Discharge: Family Type of Home: House Home Layout: Multi-level, Able to live on main level with bedroom/bathroom, 1/2 bath on main level Alternate Level Stairs-Number of Steps: flight, chair lift Home Access: Level entry Bathroom Shower/Tub: WaMultimedia programmerStandard Bathroom Accessibility: Yes How Accessible: Accessible via walker Additional Comments: Daughter provided PLOF and home living information via phone conversation; daughter reports since sunday pt  has had increased food coming out of right side of mouth when eating, difficulty with word finding/slurred speech/unintelligible speech and pt does not appear as "sharp". pt baseline converses more than yes/no, speaking in more complete sentences  Discharge Living Setting Plans for Discharge Living Setting: Lives with (comment) (daughter, [name]) Type of Home at Discharge: House Discharge Home Layout: Two level, 1/2 bath on main level (sleeps downstairs, but bathes upstairs) Alternate Level Stairs-Rails:  (stair lift) Alternate Level Stairs-Number of Steps: full flight Discharge Home Access: Stairs to enter, Ramped entrance Entrance Stairs-Rails: None Entrance Stairs-Number of Steps: 1 Discharge Bathroom Shower/Tub: Walk-in shower (threshold) Discharge Bathroom Toilet: Standard (BSC over toilet) Discharge Bathroom  Accessibility: Yes How Accessible: Accessible via wheelchair Does the patient have any problems obtaining your medications?: No  Social/Family/Support Systems Anticipated Caregiver: pt's daughter, [name] Anticipated Caregiver's Contact Information: [name] [phone] Ability/Limitations of Caregiver: providing care for pt prior to admit Caregiver Availability: 24/7 Discharge Plan Discussed with Primary Caregiver: Yes Is Caregiver In Agreement with Plan?: Yes Does Caregiver/Family have Issues with Lodging/Transportation while Pt is in Rehab?: No  Goals Patient/Family Goal for Rehab: PT/OT min assist, SLP min to mod assist Expected length of stay: 12-14 days Pt/Family Agrees to Admission and willing to participate: Yes Program Orientation Provided & Reviewed with Pt/Caregiver Including Roles  & Responsibilities: Yes  Decrease burden of Care through IP rehab admission: n/a  Possible need for SNF placement upon discharge: No  Patient Condition: I have reviewed medical records from [facility], spoken with  [facility] team , and patient and daughter. I met with patient at the bedside and discussed via phone for inpatient rehabilitation assessment.  Patient will benefit from ongoing PT, OT, and SLP, can actively participate in 3 hours of therapy a day 5 days of the week, and can make measurable gains during the admission.  Patient will also benefit from the coordinated team approach during an Inpatient Acute Rehabilitation admission.  The patient will receive intensive therapy as well as Rehabilitation physician, nursing, social worker, and care management interventions.  Due to bladder management, bowel management, safety, skin/wound care, disease management, medication administration, pain management, and patient education the patient requires 24 hour a day rehabilitation nursing.  The patient is currently min to mod assist with mobility and basic ADLs.  Discharge setting and therapy post  discharge at home with home health is anticipated.  Patient has agreed to participate in the Acute Inpatient Rehabilitation Program and will admit today.  Preadmission Screen Completed By:  Michel Santee, PT, DPT 09/24/2021 12:02 PM ______________________________________________________________________   Discussed status with Dr. Naaman Plummer on 09/24/21  at 12:02 PM  and received approval for admission today.  Admission Coordinator:  Michel Santee, PT, DPT time 12:02 PM Sudie Grumbling 09/24/21    Assessment/Plan: Diagnosis: AICIA/cerebellar infarct Does the need for close, 24 hr/day Medical supervision in concert with the patient's rehab needs make it unreasonable for this patient to be served in a less intensive setting? Yes Co-Morbidities requiring supervision/potential complications: htn, prior aphasia, afib Due to bladder management, bowel management, safety, skin/wound care, disease management, medication administration, pain management, and patient education, does the patient require 24 hr/day rehab nursing? Yes Does the patient require coordinated care of a physician, rehab nurse, PT, OT, and SLP to address physical and functional deficits in the context of the above medical diagnosis(es)? Yes Addressing deficits in the following areas: balance, endurance, locomotion, strength, transferring, bowel/bladder control, bathing, dressing, feeding, grooming, toileting, cognition, language, swallowing, and  psychosocial support Can the patient actively participate in an intensive therapy program of at least 3 hrs of therapy 5 days a week? Yes The potential for patient to make measurable gains while on inpatient rehab is excellent Anticipated functional outcomes upon discharge from inpatient rehab: min assist PT, min assist OT, min assist and mod assist SLP Estimated rehab length of stay to reach the above functional goals is: 12-14 days Anticipated discharge destination: Home 10. Overall Rehab/Functional  Prognosis: excellent   MD Signature: Meredith Staggers, MD, [place] Director Rehabilitation Services 09/24/2021

## 2021-09-23 NOTE — Evaluation (Signed)
Clinical/Bedside Swallow Evaluation Patient Details  Name: Lindsey [name] MRN: [id number] Date of Birth: [birth year]  Today's Date: 09/23/2021 Time: SLP Start Time (ACUTE ONLY): 1119 SLP Stop Time (ACUTE ONLY): 1149 SLP Time Calculation (min) (ACUTE ONLY): 30 min  Past Medical History:  Past Medical History:  Diagnosis Date   Diverticulosis    Hypertension    Uterine prolapse    Past Surgical History: History reviewed. No pertinent surgical history. HPI:  Pt is a 65 y.o. female who presents to [facility] on 09/19/2021 with nausea/vomiting, diaphoresis, and malaise.  Pt admitted for management of R sided pyelonephritis with developing renal abscess. 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 12/22: Pt having staring episodes and foaming at mouth concering for seizure. EEG results pending. MBS (04/28/20) revealed mod oral dysphagia, with no aspiration noted, however coughing with thins via straw observed post MBS. Dys 2, thin liquids (no straw) recommended at that time and at discharge from CIR (05/28/20). She has been seen by OP SLP services for cognitive-linguistic functions (aphasia, apraxia), with most recent discharge 09/01/21 and report of pt effectively communicating with Lingraphica device. 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   Assessment / Plan / Recommendation  Clinical Impression  Pt alert and upright in bed for swallow eval this date, with daughter at bedside who reports pt consuming regular, thin liquid diet at baseline. Oral mechanism examination significant for R facial asymmetry and several missing dentition. Thin liquids via consecutive straw sips, bites of puree and regular textures were consumed without overt s/sx of aspiration. Trace residuals of regular texture noted in R buccal cavity, but with min verbal cues for lingual sweep/liquid wash, complete oral clearance acheived. Pt impulsive with intake,  requiring supervision and assist for small sips/bites and slow rate. Recommend regular, thin liquid diet with SLP to f/u for tolerance and use of strategies to improve safety wtih PO intake. Pt, daughter and RN expressed understanding and agreement with recommendations.  SLP Visit Diagnosis: Dysphagia, unspecified (R13.10)    Aspiration Risk  Mild aspiration risk    Diet Recommendation Regular;Thin liquid   Liquid Administration via: Cup;Straw Medication Administration: Whole meds with puree Supervision: Staff to assist with self feeding;Full supervision/cueing for compensatory strategies Compensations: Minimize environmental distractions;Slow rate;Small sips/bites;Lingual sweep for clearance of pocketing;Follow solids with liquid Postural Changes: Seated upright at 90 degrees    Other  Recommendations Oral Care Recommendations: Oral care BID    Recommendations for follow up therapy are one component of a multi-disciplinary discharge planning process, led by the attending physician.  Recommendations may be updated based on patient status, additional functional criteria and insurance authorization.  Follow up Recommendations Other (comment) (TBD)      Assistance Recommended at Discharge Other (comment) (TBD)  Functional Status Assessment Patient has had a recent decline in their functional status and demonstrates the ability to make significant improvements in function in a reasonable and predictable amount of time.  Frequency and Duration min 2x/week  2 weeks       Prognosis Prognosis for Safe Diet Advancement: Good Barriers to Reach Goals: Time post onset;Language deficits      Swallow Study   General HPI: Pt is a 65 y.o. female who presents to [facility] hospital on 09/19/2021 with nausea/vomiting, diaphoresis, and malaise.  Pt admitted for management of R sided pyelonephritis with developing renal abscess. 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 12/22: Pt  having  staring episodes and foaming at mouth concering for seizure. EEG results pending. MBS (04/28/20 revealed mod oral dysphagia, with no aspiration noted, however coughing with thins via straw observed post MBS. Dys 2, thin liquids (no straw) recommended at that time and at discharge from CIR (05/28/20). She has been seen by OP SLP services for cognitive-linguistic functions (aphasia, apraxia), with most recent discharge 09/01/21 and report of pt effectively communicating with Lingraphica device. 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, as well as afib, HTN Type of Study: Bedside Swallow Evaluation Previous Swallow Assessment: see HPI Diet Prior to this Study: NPO Temperature Spikes Noted: No Respiratory Status: Room air History of Recent Intubation: No Behavior/Cognition: Alert;Cooperative;Pleasant mood Oral Cavity Assessment: Within Functional Limits Oral Care Completed by SLP: No Oral Cavity - Dentition: Missing dentition;Dentures, not available Vision: Functional for self-feeding Self-Feeding Abilities: Able to feed self;Needs assist Patient Positioning: Upright in bed;Postural control adequate for testing Baseline Vocal Quality: Normal Volitional Cough: Cognitively unable to elicit Volitional Swallow: Unable to elicit    Oral/Motor/Sensory Function Overall Oral Motor/Sensory Function: Mild impairment Facial ROM: Within Functional Limits Facial Symmetry: Abnormal symmetry right;Suspected CN VII (facial) dysfunction Lingual ROM: Within Functional Limits Lingual Symmetry: Within Functional Limits   Ice Chips Ice chips: Within functional limits Presentation: Spoon   Thin Liquid Thin Liquid: Within functional limits Presentation: Cup;Straw;Self Fed    Nectar Thick Nectar Thick Liquid: Not tested   Honey Thick Honey Thick Liquid: Not tested   Puree Puree: Within functional limits Presentation: Self Fed;Spoon   Solid     Solid: Impaired Presentation: Self  Fed Oral Phase Functional Implications: Right lateral sulci pocketing     Avie Echevaria, MA, CCC-SLP Acute Rehabilitation Services Office Number: [phone]  Paulette Blanch 09/23/2021,12:29 PM

## 2021-09-23 NOTE — Progress Notes (Signed)
Inpatient Rehab Admissions Coordinator:   Met with patient at the bedside.  She was sleepy and didn't interact with me much.  Note events of this morning.  I spoke to her daughter on the phone who shares that patient was admitted to [facility] following a stroke in July of 2021.  At home she was mobilizing with [facility] and supervision, assist for thoroughness with ADLs.  Daughter provides 24/7 assist at baseline and is prepared to continue this.  We reviewed Medicare coverage and I have verified it is active.  I will plan potential admit to CIR when medically ready and pending bed availability.   Shann Medal, PT, DPT Admissions Coordinator [phone] 09/23/21  3:51 PM

## 2021-09-23 NOTE — Plan of Care (Signed)
°  Problem: Education: Goal: Knowledge of disease or condition will improve Outcome: Progressing Goal: Knowledge of secondary prevention will improve (SELECT ALL) Outcome: Progressing Goal: Knowledge of patient specific risk factors will improve (INDIVIDUALIZE FOR PATIENT) Outcome: Progressing   Problem: Coping: Goal: Will verbalize positive feelings about self Outcome: Progressing   Problem: Health Behavior/Discharge Planning: Goal: Ability to manage health-related needs will improve Outcome: Progressing   Problem: Self-Care: Goal: Ability to participate in self-care as condition permits will improve Outcome: Progressing   Problem: Nutrition: Goal: Risk of aspiration will decrease Outcome: Progressing   Problem: Ischemic Stroke/TIA Tissue Perfusion: Goal: Complications of ischemic stroke/TIA will be minimized Outcome: Progressing

## 2021-09-24 ENCOUNTER — Inpatient Hospital Stay (HOSPITAL_COMMUNITY)
Admission: RE | Admit: 2021-09-24 | Discharge: 2021-09-26 | DRG: 056 | Disposition: A | Discharge status: Other Acute Inpatient Hospital | Payer: Medicare Other | Source: Intra-hospital | Attending: Physical Medicine & Rehabilitation | Admitting: Physical Medicine & Rehabilitation

## 2021-09-24 ENCOUNTER — Encounter (HOSPITAL_COMMUNITY): Payer: Self-pay | Admitting: Physical Medicine & Rehabilitation

## 2021-09-24 ENCOUNTER — Other Ambulatory Visit: Payer: Self-pay

## 2021-09-24 ENCOUNTER — Inpatient Hospital Stay (HOSPITAL_COMMUNITY): Payer: Medicare Other

## 2021-09-24 DIAGNOSIS — N280 Ischemia and infarction of kidney: Secondary | ICD-10-CM | POA: Diagnosis not present

## 2021-09-24 DIAGNOSIS — B9689 Other specified bacterial agents as the cause of diseases classified elsewhere: Secondary | ICD-10-CM | POA: Diagnosis not present

## 2021-09-24 DIAGNOSIS — I69351 Hemiplegia and hemiparesis following cerebral infarction affecting right dominant side: Secondary | ICD-10-CM

## 2021-09-24 DIAGNOSIS — N12 Tubulo-interstitial nephritis, not specified as acute or chronic: Secondary | ICD-10-CM | POA: Diagnosis present

## 2021-09-24 DIAGNOSIS — Z79899 Other long term (current) drug therapy: Secondary | ICD-10-CM

## 2021-09-24 DIAGNOSIS — G9389 Other specified disorders of brain: Secondary | ICD-10-CM | POA: Diagnosis present

## 2021-09-24 DIAGNOSIS — I10 Essential (primary) hypertension: Secondary | ICD-10-CM

## 2021-09-24 DIAGNOSIS — B961 Klebsiella pneumoniae [K. pneumoniae] as the cause of diseases classified elsewhere: Secondary | ICD-10-CM | POA: Diagnosis present

## 2021-09-24 DIAGNOSIS — R569 Unspecified convulsions: Secondary | ICD-10-CM | POA: Diagnosis present

## 2021-09-24 DIAGNOSIS — E785 Hyperlipidemia, unspecified: Secondary | ICD-10-CM | POA: Diagnosis present

## 2021-09-24 DIAGNOSIS — K5900 Constipation, unspecified: Secondary | ICD-10-CM | POA: Diagnosis present

## 2021-09-24 DIAGNOSIS — N390 Urinary tract infection, site not specified: Secondary | ICD-10-CM | POA: Diagnosis not present

## 2021-09-24 DIAGNOSIS — I69320 Aphasia following cerebral infarction: Secondary | ICD-10-CM

## 2021-09-24 DIAGNOSIS — I63542 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: Secondary | ICD-10-CM | POA: Diagnosis not present

## 2021-09-24 DIAGNOSIS — N151 Renal and perinephric abscess: Secondary | ICD-10-CM | POA: Diagnosis present

## 2021-09-24 DIAGNOSIS — N10 Acute tubulo-interstitial nephritis: Secondary | ICD-10-CM | POA: Diagnosis not present

## 2021-09-24 DIAGNOSIS — Z7901 Long term (current) use of anticoagulants: Secondary | ICD-10-CM

## 2021-09-24 DIAGNOSIS — I4891 Unspecified atrial fibrillation: Secondary | ICD-10-CM | POA: Diagnosis present

## 2021-09-24 DIAGNOSIS — I4819 Other persistent atrial fibrillation: Secondary | ICD-10-CM

## 2021-09-24 DIAGNOSIS — K3184 Gastroparesis: Secondary | ICD-10-CM | POA: Diagnosis not present

## 2021-09-24 DIAGNOSIS — A419 Sepsis, unspecified organism: Secondary | ICD-10-CM | POA: Diagnosis not present

## 2021-09-24 DIAGNOSIS — R509 Fever, unspecified: Secondary | ICD-10-CM

## 2021-09-24 DIAGNOSIS — D649 Anemia, unspecified: Secondary | ICD-10-CM

## 2021-09-24 LAB — CBC
HCT: 33.5 % — ABNORMAL LOW (ref 36.0–46.0)
HCT: 33.2 % — ABNORMAL LOW (ref 36.0–46.0)
Hemoglobin: 11.2 g/dL — ABNORMAL LOW (ref 12.0–15.0)
Hemoglobin: 11.3 g/dL — ABNORMAL LOW (ref 12.0–15.0)
MCH: 28.5 pg (ref 26.0–34.0)
MCH: 29.3 pg (ref 26.0–34.0)
MCHC: 33.4 g/dL (ref 30.0–36.0)
MCHC: 34 g/dL (ref 30.0–36.0)
MCV: 85.2 fL (ref 80.0–100.0)
MCV: 86 fL (ref 80.0–100.0)
Platelets: 413 10*3/uL — ABNORMAL HIGH (ref 150–400)
Platelets: 433 10*3/uL — ABNORMAL HIGH (ref 150–400)
RBC: 3.93 MIL/uL (ref 3.87–5.11)
RBC: 3.86 MIL/uL — ABNORMAL LOW (ref 3.87–5.11)
RDW: 14.6 % (ref 11.5–15.5)
RDW: 14.7 % (ref 11.5–15.5)
WBC: 4.4 10*3/uL (ref 4.0–10.5)
WBC: 5 10*3/uL (ref 4.0–10.5)
nRBC: 0 % (ref 0.0–0.2)
nRBC: 0 % (ref 0.0–0.2)

## 2021-09-24 LAB — BASIC METABOLIC PANEL
Anion gap: 8 (ref 5–15)
BUN: 9 mg/dL (ref 8–23)
CO2: 24 mmol/L (ref 22–32)
Calcium: 9 mg/dL (ref 8.9–10.3)
Chloride: 103 mmol/L (ref 98–111)
Creatinine, Ser: 0.79 mg/dL (ref 0.44–1.00)
GFR, Estimated: >60 mL/min (ref >60)
Glucose, Bld: 112 mg/dL — ABNORMAL HIGH (ref 70–99)
Potassium: 3.5 mmol/L (ref 3.5–5.1)
Sodium: 135 mmol/L (ref 135–145)

## 2021-09-24 IMAGING — DX DG ABDOMEN 1V
1 series · 1 of 1 positions shown · non-contrast
Comparison: None.

CLINICAL DATA: Abdominal pain, vomiting

EXAM:
ABDOMEN - 1 VIEW

[abdomen]
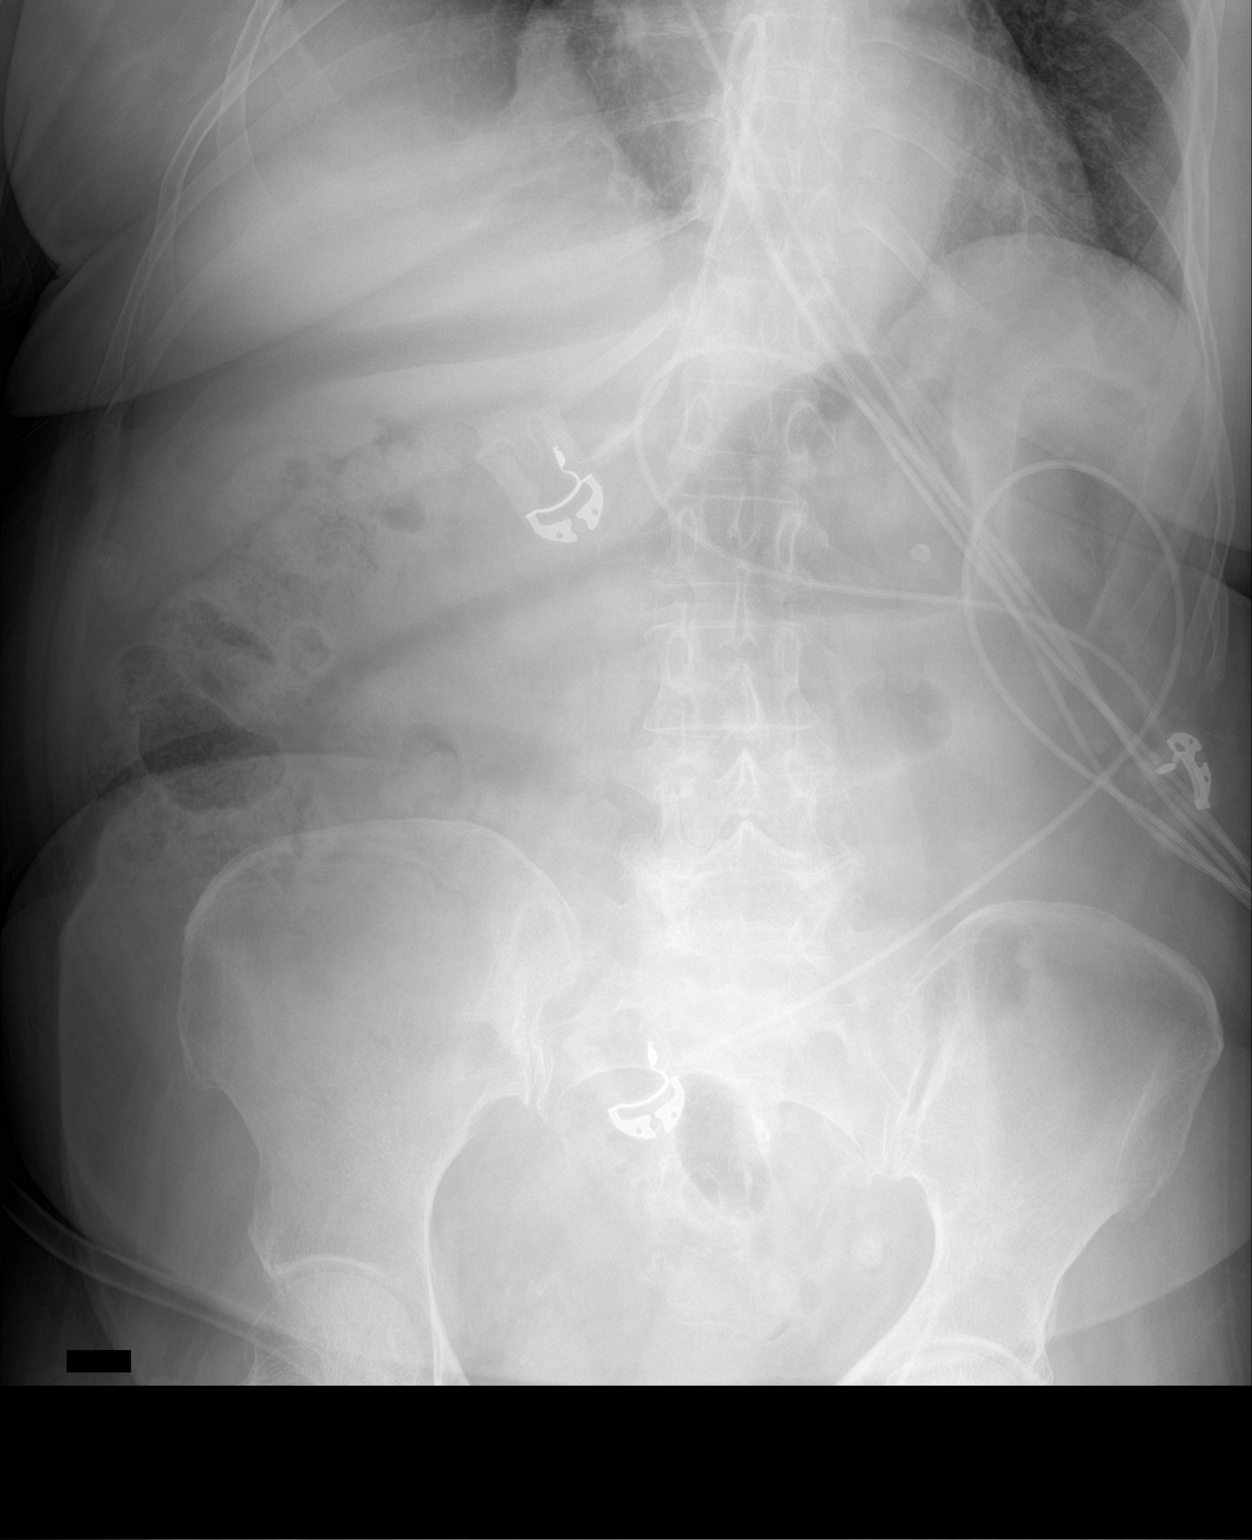

[1 of 1 positions shown; findings below may reference images not displayed]

FINDINGS: The bowel gas pattern is normal. No radio-opaque calculi or other
significant radiographic abnormality are seen.
IMPRESSION: Nonobstructive pattern of bowel gas. No obvious free air in the
abdomen on single supine radiograph.

## 2021-09-24 MED ORDER — PROCHLORPERAZINE 25 MG RE SUPP: 12.5000 mg | Freq: Four times a day (QID) | RECTAL | Status: DC | PRN

## 2021-09-24 MED ORDER — APIXABAN 5 MG PO TABS
5.0000 mg | ORAL_TABLET | Freq: Two times a day (BID) | ORAL | Status: DC
  Administered 2021-09-24 – 2021-09-26 (×4): 5 mg via ORAL
  Filled 2021-09-24 (×4): qty 1

## 2021-09-24 MED ORDER — BISACODYL 10 MG RE SUPP
10.0000 mg | Freq: Every day | RECTAL | Status: DC | PRN
  Filled 2021-09-24: qty 1

## 2021-09-24 MED ORDER — GUAIFENESIN-DM 100-10 MG/5ML PO SYRP: 5.0000 mL | ORAL_SOLUTION | Freq: Four times a day (QID) | ORAL | Status: DC | PRN

## 2021-09-24 MED ORDER — POLYETHYLENE GLYCOL 3350 17 G PO PACK
17.0000 g | PACK | Freq: Every day | ORAL | Status: DC | PRN
  Administered 2021-09-24: 18:00:00 17 g via ORAL
  Filled 2021-09-24: qty 1

## 2021-09-24 MED ORDER — SENNA 8.6 MG PO TABS
1.0000 | ORAL_TABLET | Freq: Two times a day (BID) | ORAL | Status: DC
  Administered 2021-09-25 (×2): 8.6 mg via ORAL
  Filled 2021-09-24 (×4): qty 1

## 2021-09-24 MED ORDER — ACETAMINOPHEN 325 MG PO TABS: 325.0000 mg | ORAL_TABLET | ORAL | Status: DC | PRN

## 2021-09-24 MED ORDER — ATORVASTATIN CALCIUM 10 MG PO TABS
20.0000 mg | ORAL_TABLET | Freq: Every day | ORAL | Status: DC
  Administered 2021-09-24 – 2021-09-25 (×2): 20 mg via ORAL
  Filled 2021-09-24 (×2): qty 2

## 2021-09-24 MED ORDER — ALUM & MAG HYDROXIDE-SIMETH 200-200-20 MG/5ML PO SUSP: 30.0000 mL | ORAL | Status: DC | PRN

## 2021-09-24 MED ORDER — PROCHLORPERAZINE EDISYLATE 10 MG/2ML IJ SOLN: 5.0000 mg | Freq: Four times a day (QID) | INTRAMUSCULAR | Status: DC | PRN

## 2021-09-24 MED ORDER — PROCHLORPERAZINE MALEATE 5 MG PO TABS
5.0000 mg | ORAL_TABLET | Freq: Four times a day (QID) | ORAL | Status: DC | PRN
  Administered 2021-09-24 – 2021-09-25 (×3): 5 mg via ORAL
  Administered 2021-09-25: 19:00:00 10 mg via ORAL
  Filled 2021-09-24: qty 1
  Filled 2021-09-24 (×2): qty 2
  Filled 2021-09-24 (×2): qty 1

## 2021-09-24 MED ORDER — LEVOFLOXACIN 750 MG PO TABS
750.0000 mg | ORAL_TABLET | Freq: Every day | ORAL | Status: DC
  Administered 2021-09-25: 08:00:00 750 mg via ORAL
  Filled 2021-09-24 (×2): qty 1

## 2021-09-24 MED ORDER — SENNOSIDES-DOCUSATE SODIUM 8.6-50 MG PO TABS: 1.0000 | ORAL_TABLET | Freq: Once | ORAL | Status: DC

## 2021-09-24 MED ORDER — DIPHENHYDRAMINE HCL 12.5 MG/5ML PO ELIX: 12.5000 mg | ORAL_SOLUTION | Freq: Four times a day (QID) | ORAL | Status: DC | PRN

## 2021-09-24 MED ORDER — ASPIRIN 81 MG PO TBEC: 81.0000 mg | DELAYED_RELEASE_TABLET | Freq: Every day | ORAL | 0 refills | Status: AC

## 2021-09-24 MED ORDER — AMLODIPINE BESYLATE 5 MG PO TABS
5.0000 mg | ORAL_TABLET | Freq: Every day | ORAL | Status: DC
  Administered 2021-09-25 – 2021-09-26 (×2): 5 mg via ORAL
  Filled 2021-09-24 (×2): qty 1

## 2021-09-24 MED ORDER — METOPROLOL TARTRATE 50 MG PO TABS
50.0000 mg | ORAL_TABLET | Freq: Two times a day (BID) | ORAL | Status: DC
  Administered 2021-09-24 – 2021-09-26 (×4): 50 mg via ORAL
  Filled 2021-09-24 (×4): qty 1

## 2021-09-24 MED ORDER — ASPIRIN EC 81 MG PO TBEC
81.0000 mg | DELAYED_RELEASE_TABLET | Freq: Every day | ORAL | Status: DC
  Administered 2021-09-25 – 2021-09-26 (×2): 81 mg via ORAL
  Filled 2021-09-24 (×2): qty 1

## 2021-09-24 MED ORDER — LEVETIRACETAM IN NACL 500 MG/100ML IV SOLN
500.0000 mg | Freq: Two times a day (BID) | INTRAVENOUS | Status: DC
  Filled 2021-09-24 (×2): qty 100

## 2021-09-24 MED ORDER — LEVOFLOXACIN 750 MG PO TABS: 750.0000 mg | ORAL_TABLET | Freq: Every day | ORAL | 0 refills | Status: DC

## 2021-09-24 MED ORDER — LEVETIRACETAM 500 MG PO TABS: 500.0000 mg | ORAL_TABLET | Freq: Two times a day (BID) | ORAL | 0 refills | Status: DC

## 2021-09-24 MED ORDER — BISACODYL 10 MG RE SUPP: 10.0000 mg | Freq: Every day | RECTAL | Status: DC | PRN

## 2021-09-24 MED ORDER — ONDANSETRON HCL 4 MG PO TABS
4.0000 mg | ORAL_TABLET | Freq: Two times a day (BID) | ORAL | 0 refills | Status: DC | PRN
Start: 2021-09-24 — End: 2021-10-22

## 2021-09-24 MED ORDER — TRAZODONE HCL 50 MG PO TABS: 25.0000 mg | ORAL_TABLET | Freq: Every evening | ORAL | Status: DC | PRN

## 2021-09-24 MED ORDER — FLEET ENEMA 7-19 GM/118ML RE ENEM: 1.0000 | ENEMA | Freq: Once | RECTAL | Status: DC | PRN

## 2021-09-24 MED ORDER — LEVETIRACETAM 500 MG PO TABS
500.0000 mg | ORAL_TABLET | Freq: Two times a day (BID) | ORAL | Status: DC
  Administered 2021-09-24: 20:00:00 500 mg via ORAL
  Filled 2021-09-24: qty 1

## 2021-09-24 NOTE — Discharge Summary (Signed)
Name: Lindsey [name] MRN: [id number] DOB: 1956/06/24 65 y.o. PCP: Emogene Morgan, MD  Date of Admission: 09/19/2021 11:20 AM Date of Discharge: 09/24/2021 Attending Physician: Miguel Aschoff, MD  Discharge Diagnosis: 1. 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 Atrial fibrillation 3. New AICA and PICA infarcts 4. Seizure 5. Multifocal pyelonephritis 6. HTN 7. Chronic normocytic anemia  Discharge Medications: Allergies as of 09/24/2021   No Known Allergies      Medication List     TAKE these medications    acetaminophen 500 MG tablet Commonly known as: TYLENOL Take 500 mg by mouth every 6 (six) hours as needed for moderate pain. What changed: Another medication with the same name was removed. Continue taking this medication, and follow the directions you see here.   amLODipine 5 MG tablet Commonly known as: NORVASC Take 1 tablet (5 mg total) by mouth daily.   apixaban 5 MG Tabs tablet Commonly known as: ELIQUIS Take 1 tablet (5 mg total) by mouth 2 (two) times daily.   aspirin 81 MG EC tablet Take 1 tablet (81 mg total) by mouth daily. Swallow whole. Start taking on: September 25, 2021   atorvastatin 20 MG tablet Commonly known as: LIPITOR Take 1 tablet (20 mg total) by mouth daily at 6 PM.   levETIRAcetam 500 MG tablet Commonly known as: KEPPRA Take 1 tablet (500 mg total) by mouth every 12 (twelve) hours.   levofloxacin 750 MG tablet Commonly known as: LEVAQUIN Take 1 tablet (750 mg total) by mouth daily for 27 days. Start taking on: September 25, 2021   metoprolol tartrate 50 MG tablet Commonly known as: LOPRESSOR Take 1 tablet (50 mg total) by mouth 2 (two) times daily.   Multivitamin Adults 50+ Tabs Take 1 tablet by mouth daily.   ondansetron 4 MG tablet Commonly known as: ZOFRAN Take 1 tablet (4 mg total) by mouth every 12 (twelve) hours as needed for nausea or vomiting.        Disposition and follow-up:    Ms.Lindsey [name] was discharged from [facility] in Stable condition.  At the hospital follow up visit please address:  1.  Follow up blood cultures. Small chance for potential need for TEE to rule out vegetations. Also, adjust bowel regimen for constipation (no documented BM for several days).   2.  Labs / imaging needed at time of follow-up: NA  3.  Pending labs/ test needing follow-up: Blood cultures   Follow-up Appointments:  Follow-up Information     [facility]. Call today.   Why: please to schedule an appointment in 2-3 weeks Contact information: [address] [place] [address] [phone]        Judyann Munson, MD Follow up.   Specialty: Infectious Diseases Why: 10/18/21 at 9:15 am. Please Contact information: [address] [place] [address] [phone]                 Hospital Course by problem list: 1. 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 Atrial fibrillation 3. New AICA and PICA infarcts 4. Seizure 5. Multifocal pyelonephritis Patient presented with nausea, vomiting, diaphoresis, and slurred speech in the context of pre-existing neuro deficits. CT head was negative for new stroke and a code stroke was not called. CT A/P was notable for multifocal pyelonephritis of the right kidney, for which the patient was started on ceftriaxone. Of note,  the patient's UA was negative and she had no leukocytosis or fever. The next day (12/19) the patient appeared much improved. She was no longer lethargic and produced spontaneous speech. The daughter reported that the patient was at her baseline level of functioning on that day. The next day (12/20) the patient exhibit more dysarthria and aphasia. An MRI-brain was obtained showing acute left AICA cerebellar infarct without hemorrhagic transformation as well as small left PICA infarcts, indicative of embolic strokes. This  was thought to be most likely secondary to her afib (for which she takes Eliquis 5 mg BID). Transthoracic echo was negative for thrombus, CT angiogram of the head and neck showed patent vasculature. She had a likely seizure that night and was therefore Keppra loaded and started on Keppra 500 mg BID going forward. EEG was negative for repeat seizure activity and there was no clinical recurrence noted throughout the rest of her stay. On assessment on 12/23 (day of discharge), the patient again exhibited improvement, with notably improved spontaneous speech, though she still had difficulty expressing herself. Her strength was noted to have improved as below.   An MRI-abdomen was performed, which showed a 3.5 cm abscess in the right kidney. ID was consulted and they recommended a 4 week course of Levaquin, which the patient was switched to. The patient had ID follow up with Dr. Drue Second scheduled for January 16th.    Discharge Exam:   BP 116/81 (BP Location: Right Arm)    Pulse 89    Temp 98 F (36.7 C) (Oral)    Resp 19    Ht 5\' 4"  (1.626 m)    Wt 74.8 kg    SpO2 100%    BMI 28.32 kg/m  Discharge exam:  Physical Exam Vitals reviewed.  Pulmonary:     Effort: Pulmonary effort is normal.     Breath sounds: Normal breath sounds.  Abdominal:     General: Bowel sounds are normal.     Palpations: Abdomen is soft.     Tenderness: There is no abdominal tenderness.  Skin:    General: Skin is warm and dry.  Neurological:     Mental Status: She is alert.     Comments: Patient produces spontaneous speech, significantly improved Strength 4/5 in LUE and 3/5 in LLE 1/5 strength in RUE and RLE      Pertinent Labs, Studies, and Procedures:  CTH and MRI-brain as above, CT A/P and MRI-abdomen as above  Discharge Instructions: Discharge Instructions     Call MD for:  difficulty breathing, headache or visual disturbances   Complete by: As directed    Call MD for:  extreme fatigue   Complete by: As  directed    Call MD for:  persistant dizziness or light-headedness   Complete by: As directed    Call MD for:  persistant nausea and vomiting   Complete by: As directed    Call MD for:  severe uncontrolled pain   Complete by: As directed    Call MD for:  temperature >100.4   Complete by: As directed    Diet - low sodium heart healthy   Complete by: As directed    Increase activity slowly   Complete by: As directed        Signed: , MD PGY-1

## 2021-09-24 NOTE — Progress Notes (Signed)
Patient admitted to floor via bed, grandson by side. Denies pain. Patient and grandson oriented to floor, safety and made aware of plan of care. Bed I lowest position and call bell within reach. Will continue to monitor

## 2021-09-24 NOTE — Progress Notes (Addendum)
Patient with episode of emesis during HS med pass. Patient had received Keppra and metoprolol a couple minutes before episode. Pills not noted in the output. Compazine 10mg  given with the rest of HS meds crushed in a small amount of puree. Patient tolerated well. Continue to monitor.

## 2021-09-24 NOTE — Progress Notes (Signed)
SLP Cancellation Note  Patient Details Name: Lindsey [name] MRN: [id number] DOB: August 16, 1956   Cancelled treatment:       Reason Eval/Treat Not Completed: Pt working with PT. Will f/u as able.     Avie Echevaria, MA, CCC-SLP Acute Rehabilitation Services Office Number: [phone]  Paulette Blanch 09/24/2021, 10:56 AM

## 2021-09-24 NOTE — Plan of Care (Signed)
°  Problem: Education: °Goal: Knowledge of disease or condition will improve °Outcome: Progressing °Goal: Knowledge of secondary prevention will improve (SELECT ALL) °Outcome: Progressing °  °Problem: Coping: °Goal: Will verbalize positive feelings about self °Outcome: Progressing °  °Problem: Health Behavior/Discharge Planning: °Goal: Ability to manage health-related needs will improve °Outcome: Progressing °  °Problem: Self-Care: °Goal: Ability to participate in self-care as condition permits will improve °Outcome: Progressing °  °Problem: Ischemic Stroke/TIA Tissue Perfusion: °Goal: Complications of ischemic stroke/TIA will be minimized °Outcome: Progressing °  °

## 2021-09-24 NOTE — TOC Transition Note (Signed)
Transition of Care [facility]) - CM/SW Discharge Note   Patient Details  Name: Lindsey [name] MRN: [id number] Date of Birth: 1955/11/20  [facility] CM/SW Contact:  Kermit Balo, RN Phone Number: 09/24/2021, 11:15 AM   Clinical Narrative:    Patient is discharging to CIR today. CM signing off.    Final next level of care: IP Rehab Facility Barriers to Discharge: No Barriers Identified   Patient Goals and CMS Choice Patient states their goals for this hospitalization and ongoing recovery are:: To go home CMS Medicare.gov Compare Post Acute Care list provided to:: Patient Choice offered to / list presented to : Patient  Discharge Placement                       Discharge Plan and Services   Discharge Planning Services: CM Consult Post Acute Care Choice: IP Rehab                               Social Determinants of Health (SDOH) Interventions     Readmission Risk Interventions No flowsheet data found.

## 2021-09-24 NOTE — Progress Notes (Signed)
Occupational Therapy Treatment Patient Details Name: Lindsey [name] MRN: [id number] DOB: 1956-02-14 Today's Date: 09/24/2021   History of present illness 65 y.o. female presents to [facility] hospital on 09/19/2021 with nausea/vomiting, diaphoresis, and malaise.  Pt admitted for management of R sided pyelonephritis with developing renal abscess. 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   OT comments  Patient seen with PT to address transfer and patient safety. Patient was able to get to EOB and performed donning footwear with patient able to donn sock with therapist assisting to get over toes and max assist for shoes. Patient performed grooming standing at sink with sink for support for balance and min assist provided.  Patient making good gains with OT treatment. Acute OT to continue to follow.    Recommendations for follow up therapy are one component of a multi-disciplinary discharge planning process, led by the attending physician.  Recommendations may be updated based on patient status, additional functional criteria and insurance authorization.    Follow Up Recommendations  Acute inpatient rehab (3hours/day)    Assistance Recommended at Discharge Frequent or constant Supervision/Assistance  Equipment Recommendations  None recommended by OT    Recommendations for Other Services      Precautions / Restrictions Precautions Precautions: Fall Precaution Comments: R hemiplegia and global aphasia Restrictions Weight Bearing Restrictions: No       Mobility Bed Mobility Overal bed mobility: Needs Assistance Bed Mobility: Supine to Sit     Supine to sit: Mod assist;HOB elevated     General bed mobility comments: assist for RLE and trunk management    Transfers Overall transfer level: Needs assistance Equipment used: Hemi-walker Transfers: Sit to/from Stand;Bed to  chair/wheelchair/BSC Sit to Stand: Min assist;Mod assist   Step pivot transfers: Mod assist       General transfer comment: hemi walker used for transfer.  +2 provided to assist with sit to stand but patient was able to stand with min/mod assist     Balance Overall balance assessment: Needs assistance Sitting-balance support: No upper extremity supported;Feet supported Sitting balance-Leahy Scale: Fair Sitting balance - Comments: able to assist with donning footwear while sitting on EOB   Standing balance support: Single extremity supported Standing balance-Leahy Scale: Poor Standing balance comment: able to stand at sink for grooming with min assist for balance with sink providing support                           ADL either performed or assessed with clinical judgement   ADL Overall ADL's : Needs assistance/impaired     Grooming: Wash/dry face;Wash/dry hands;Minimal assistance;Standing Grooming Details (indicate cue type and reason): performed standing at sink with verbal cues for sequencing and min assist for balance             Lower Body Dressing: Maximal assistance;Sit to/from stand Lower Body Dressing Details (indicate cue type and reason): Patient was ablet to assist with doffing socks and and mod assist to donn socks with max assist for shoes               General ADL Comments: verbal cues for sequencing and increased time to follow commands    Extremity/Trunk Assessment Upper Extremity Assessment RUE Deficits / Details: hx of hemiplegia from previous CVA. Pt with flexion contracture. LUE Deficits / Details: overall WFL;grossly 3+/5;poor control when attempting to write,  pt was right handed at baseline LUE Coordination: decreased fine motor            Vision       Perception     Praxis      Cognition Arousal/Alertness: Awake/alert Behavior During Therapy: WFL for tasks assessed/performed Overall Cognitive Status: Difficult to  assess Area of Impairment: Following commands                       Following Commands: Follows one step commands with increased time     Problem Solving: Slow processing;Decreased initiation;Requires verbal cues;Requires tactile cues General Comments: aphasic but able to respond to yes/no questions          Exercises     Shoulder Instructions       General Comments      Pertinent Vitals/ Pain       Faces Pain Scale: No hurt  Home Living                                          Prior Functioning/Environment              Frequency  Min 2X/week        Progress Toward Goals  OT Goals(current goals can now be found in the care plan section)  Progress towards OT goals: Progressing toward goals  Acute Rehab OT Goals OT Goal Formulation: Patient unable to participate in goal setting Time For Goal Achievement: 10/05/21 Potential to Achieve Goals: Good ADL Goals Pt Will Perform Eating: with set-up;sitting Pt Will Perform Grooming: with set-up;sitting Pt Will Transfer to Toilet: with min guard assist;ambulating  Plan Discharge plan remains appropriate    Co-evaluation    PT/OT/SLP Co-Evaluation/Treatment: Yes Reason for Co-Treatment: To address functional/ADL transfers;For patient/therapist safety   OT goals addressed during session: ADL's and self-care      AM-PAC OT "6 Clicks" Daily Activity     Outcome Measure   Help from another person eating meals?: A Little Help from another person taking care of personal grooming?: A Little Help from another person toileting, which includes using toliet, bedpan, or urinal?: A Lot Help from another person bathing (including washing, rinsing, drying)?: A Lot Help from another person to put on and taking off regular upper body clothing?: A Little Help from another person to put on and taking off regular lower body clothing?: A Lot 6 Click Score: 15    End of Session Equipment Utilized  During Treatment: Gait belt;Other (comment) (hemi walker)  OT Visit Diagnosis: Other abnormalities of gait and mobility (R26.89);Muscle weakness (generalized) (M62.81);Other symptoms and signs involving cognitive function   Activity Tolerance Patient tolerated treatment well   Patient Left in chair;with call bell/phone within reach;with chair alarm set   Nurse Communication Mobility status        Time: [id number] OT Time Calculation (min): 28 min  Charges: OT General Charges $OT Visit: 1 Visit OT Treatments $Self Care/Home Management : 8-22 mins  Lodema Hong, [place]  Pager [phone] [employer] 09/24/2021, 11:28 AM

## 2021-09-24 NOTE — H&P (Signed)
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  She presented to the emergency department on September 19, 2021 with new onset of nausea, vomiting, diaphoresis and change in baseline mental status as reported by her family.  Negative report of loss of consciousness or seizure type activity.  No known fever or chills.  She lives at home with her daughter who provides care.  History of atrial fibrillation compliant with Eliquis.  As part of her work-up she underwent CT scan of the abdomen pelvis which revealed multi focal pyelonephritis of the right kidney.  Urology, Dr. Jeffie Pollock was contacted who reviewed the films and the patient's data.  He recommended infectious disease consultation.  There was also concern of hyperdensity within the gallbladder body and MRCP was obtained.  Gallbladder was within normal without mass.  She was started on antibiotics intravenously for pyelonephritis.  Her positive for Klebsiella.  She was transitioned to oral Levaquin on 12/19.    A CT of the head was also performed on admission which was negative, however, due to worsening speech, MRI was performed on 12/21 which revealed Acute moderate size Left AICA cerebellar infarct. .Underlying extensive chronic left MCA territory encephalomalacia related to the 2021 infarct. Associated left brainstem Wallerian degeneration since last year.And small superimposed chronic Left PICA cerebellar infarcts, but also new since last year.  Rapid response was called on 12/22 secondary to elevated heart rate responsiveness and foaming at her mouth.  She was turned to her side and the episode resolved within a minute.  Neurology evaluated and Keppra load of 1500 mg IV was given and she was started on Keppra 500 mg twice a day.  Aspirin 81 mg added.  2D  echo was completed and negative. EF = estimated 50 to 55%. The patient requires inpatient medicine and rehabilitation evaluations and services for ongoing dysfunction secondary to stroke.  Currently, the patient is alert in no apparent distress.  Her grandson is at bedside.  Her attendant nurse reports episode of emesis.  The patient has not had a bowel movement since admission and a suppository is being administered.   Review of Systems  Constitutional:  Negative for chills and fever.  Gastrointestinal:  Positive for constipation and vomiting.  Neurological:  Positive for speech change and focal weakness.  Past Medical History:  Diagnosis Date   Diverticulosis    Hypertension    Uterine prolapse    History reviewed. No pertinent surgical history. Family History  Problem Relation Age of Onset   Breast cancer Neg Hx    Social History:  reports that she has never smoked. She has never used smokeless tobacco. She reports that she does not currently use alcohol. She reports that she does not use drugs. Allergies: No Known Allergies Medications Prior to Admission  Medication Sig Dispense Refill   acetaminophen (TYLENOL) 500 MG tablet Take 500 mg by mouth every 6 (six) hours as needed for moderate pain.     apixaban (ELIQUIS) 5 MG TABS tablet Take 1 tablet (5 mg total) by mouth 2 (two) times daily. 180 tablet 0   atorvastatin (LIPITOR) 20 MG tablet Take 1 tablet (20 mg total) by mouth daily at 6 PM. 90 tablet 3   metoprolol tartrate (LOPRESSOR) 50 MG tablet Take 1 tablet (50 mg total) by mouth 2 (two) times  daily. 60 tablet 0   Multiple Vitamins-Minerals (MULTIVITAMIN ADULTS 50+) TABS Take 1 tablet by mouth daily.     acetaminophen (TYLENOL) 325 MG tablet Take 1-2 tablets (325-650 mg total) by mouth every 4 (four) hours as needed for mild pain. (Patient not taking: Reported on 09/19/2021)     amLODipine (NORVASC) 5 MG tablet Take 1 tablet (5 mg total) by mouth daily. (Patient not taking:  Reported on 09/19/2021) 30 tablet 3    Drug Regimen Review  Drug regimen was reviewed and remains appropriate with no significant issues identified  Home: Home Living Family/patient expects to be discharged to:: Private residence Living Arrangements: Children Available Help at Discharge: Family Type of Home: House Home Access: Level entry Home Layout: Multi-level, Able to live on main level with bedroom/bathroom, 1/2 bath on main level Alternate Level Stairs-Number of Steps: flight, chair lift Bathroom Shower/Tub: Multimedia programmer: Programmer, systems: Yes Home Equipment: Civil engineer, contracting, Wheelchair - manual, Radio producer - single point, Conservation officer, nature (2 wheels), Other (comment), Hospital bed Additional Comments: Daughter provided PLOF and home living information via phone conversation; daughter reports since sunday pt has had increased food coming out of right side of mouth when eating, difficulty with word finding/slurred speech/unintelligible speech and pt does not appear as "sharp". pt baseline converses more than yes/no, speaking in more complete sentences  Lives With: Daughter   Functional History: Prior Function Prior Level of Function : Needs assist  Cognitive Assist : ADLs (cognitive) ADLs (Cognitive): Intermittent cues Physical Assist : Mobility (physical), ADLs (physical) Mobility (physical): Gait, Stairs ADLs (physical): Grooming, Bathing, Dressing, Toileting, IADLs Mobility Comments: pt ambulates with a cane and use of knee brace, requires assistance on carpeting due to reduced R foot clearance ADLs Comments: needs assistance with bathing, dressing, grooming, setup for feeding  Functional Status:  Mobility: Bed Mobility Overal bed mobility: Needs Assistance Bed Mobility: Supine to Sit Supine to sit: Mod assist, HOB elevated Sit to supine: Mod assist General bed mobility comments: assist for RLE and trunk management Transfers Overall transfer  level: Needs assistance Equipment used: Hemi-walker Transfers: Sit to/from Stand, Bed to chair/wheelchair/BSC Sit to Stand: Min assist, Mod assist Bed to/from chair/wheelchair/BSC transfer type:: Step pivot Stand pivot transfers: Mod assist Step pivot transfers: Mod assist General transfer comment: hemi walker used for transfer.  +2 provided to safely assist with sit to stand but patient was able to stand with min/mod assist from bed and chair today. max cues for safe hand placement with poor carryover. Ambulation/Gait Ambulation/Gait assistance: Mod assist, +2 safety/equipment, +2 physical assistance Gait Distance (Feet): 10 Feet Assistive device: Hemi-walker Gait Pattern/deviations: Step-to pattern, Narrow base of support, Trunk flexed, Decreased dorsiflexion - right, Decreased dorsiflexion - left General Gait Details: ~35ft pivotal steps to recliner, seated break, then ~58ft forward in room with close chair follow, initially min/modA +1 but pt needed +2 for safety with fatigue, needs assist to manage HW safely and for safe step sequencing, pt with ataxic R steps and decreased step length on LLE, pt fatigued but seemingly unaware of fatigue until instructed to sit. Per tele monitor HR ~140 bpm during gait trial and 100-120 bpm resting in chair Gait velocity: decreased Gait velocity interpretation: <1.31 ft/sec, indicative of household ambulator Pre-gait activities: pt with posterior lean and able to step forward/sidestep with RLE but unable to offload LLE for stepping even wtih face to face maxA for weight shifting pt to LLE with knee blocked for stability. pt with possible LLE motor apraxia?  difficulty also with some LLE seated exercise commands (hip flexion)    ADL: ADL Overall ADL's : Needs assistance/impaired Eating/Feeding: Minimal assistance Grooming: Wash/dry face, Wash/dry hands, Minimal assistance, Standing Grooming Details (indicate cue type and reason): performed standing at sink  with verbal cues for sequencing and min assist for balance Upper Body Bathing: Moderate assistance Lower Body Bathing: Moderate assistance, Sit to/from stand Upper Body Dressing : Moderate assistance, Sitting Lower Body Dressing: Maximal assistance, Sit to/from stand Lower Body Dressing Details (indicate cue type and reason): Patient was ablet to assist with doffing socks and and mod assist to donn socks with max assist for shoes Toilet Transfer: Moderate assistance, Buyer, retail Details (indicate cue type and reason): from EOB to recliner, using R AFO;face to face transfer, assist for weight shifting and blocking Rknee Toileting- Clothing Manipulation and Hygiene: Moderate assistance, Sit to/from stand Toileting - Clothing Manipulation Details (indicate cue type and reason): to powerup into standing and for stability Functional mobility during ADLs: Moderate assistance General ADL Comments: verbal cues for sequencing and increased time to follow commands  Cognition: Cognition Overall Cognitive Status: Difficult to assess Orientation Level: Oriented to person Cognition Arousal/Alertness: Awake/alert Behavior During Therapy: WFL for tasks assessed/performed Overall Cognitive Status: Difficult to assess Area of Impairment: Following commands Following Commands: Follows one step commands with increased time Problem Solving: Slow processing, Decreased initiation, Requires verbal cues, Requires tactile cues General Comments: aphasic but able to respond to yes/no questions, able to respond to some other questions although very dysarthric and difficult to understand, not oriented to day of week. She seemed to recall this PTA from previous day's session and was eager to get OOB today. Difficult to assess due to: Impaired communication  Physical Exam: Blood pressure (!) 154/88, pulse 74, temperature 98.9 F (37.2 C), temperature source Oral, resp. rate 17, height 5\' 4"  (1.626 m),  weight 74.8 kg, SpO2 100 %. Physical Exam HENT:     Head: Normocephalic.     Right Ear: External ear normal.     Left Ear: External ear normal.     Nose: Nose normal.     Mouth/Throat:     Mouth: Mucous membranes are moist.  Eyes:     Extraocular Movements: Extraocular movements intact.     Pupils: Pupils are equal, round, and reactive to light.  Cardiovascular:     Rate and Rhythm: Normal rate. Rhythm irregular.     Heart sounds: No murmur heard.   No gallop.  Pulmonary:     Effort: Pulmonary effort is normal.  Abdominal:     General: Abdomen is flat. There is no distension.     Palpations: Abdomen is soft.     Tenderness: There is no abdominal tenderness.  Musculoskeletal:        General: No swelling or deformity. Normal range of motion.     Cervical back: Normal range of motion. No tenderness.  Neurological:     Mental Status: She is alert.     Comments: Patient with closed eyes,opens to name and able to follow simple commands, RLE and Rue flaccid.aphasic. moves left side spontaneously. Responds to pain in all 4's.   Psychiatric:     Comments: Flat but does engage.    Results for orders placed or performed during the hospital encounter of 09/19/21 (from the past 48 hour(s))  Glucose, capillary     Status: Abnormal   Collection Time: 09/23/21  1:08 AM  Result Value Ref Range   Glucose-Capillary 121 (H)  70 - 99 mg/dL    Comment: Glucose reference range applies only to samples taken after fasting for at least 8 hours.  Hemoglobin A1c     Status: Abnormal   Collection Time: 09/23/21  2:33 AM  Result Value Ref Range   Hgb A1c MFr Bld 5.7 (H) 4.8 - 5.6 %    Comment: (NOTE) Pre diabetes:          5.7%-6.4%  Diabetes:              >6.4%  Glycemic control for   <7.0% adults with diabetes    Mean Plasma Glucose 116.89 mg/dL    Comment: Performed at [facility] [address] [place], [place] [address]  Lipid panel     Status: None   Collection Time: 09/23/21   2:33 AM  Result Value Ref Range   Cholesterol 113 0 - 200 mg/dL   Triglycerides 49 <150 mg/dL   HDL 42 >40 mg/dL   Total CHOL/HDL Ratio 2.7 RATIO   VLDL 10 0 - 40 mg/dL   LDL Cholesterol 61 0 - 99 mg/dL    Comment:        Total Cholesterol/HDL:CHD Risk Coronary Heart Disease Risk Table                     Men   Women  1/2 Average Risk   3.4   3.3  Average Risk       5.0   4.4  2 X Average Risk   9.6   7.1  3 X Average Risk  23.4   11.0        Use the calculated Patient Ratio above and the CHD Risk Table to determine the patient's CHD Risk.        ATP III CLASSIFICATION (LDL):  <100     mg/dL   Optimal  100-129  mg/dL   Near or Above                    Optimal  130-159  mg/dL   Borderline  160-189  mg/dL   High  >190     mg/dL   Very High Performed at [facility] [address], [place], [place] [id number]   Basic metabolic panel     Status: Abnormal   Collection Time: 09/23/21  2:33 AM  Result Value Ref Range   Sodium 134 (L) 135 - 145 mmol/L   Potassium 3.9 3.5 - 5.1 mmol/L   Chloride 99 98 - 111 mmol/L   CO2 21 (L) 22 - 32 mmol/L   Glucose, Bld 135 (H) 70 - 99 mg/dL    Comment: Glucose reference range applies only to samples taken after fasting for at least 8 hours.   BUN 8 8 - 23 mg/dL   Creatinine, Ser 0.79 0.44 - 1.00 mg/dL   Calcium 9.5 8.9 - 10.3 mg/dL   GFR, Estimated >60 >60 mL/min    Comment: (NOTE) Calculated using the CKD-EPI Creatinine Equation (2021)    Anion gap 14 5 - 15    Comment: Performed at [facility] [address] [place] [address]  CBC     Status: Abnormal   Collection Time: 09/23/21  2:33 AM  Result Value Ref Range   WBC 5.5 4.0 - 10.5 K/uL   RBC 4.53 3.87 - 5.11 MIL/uL   Hemoglobin 13.1 12.0 - 15.0 g/dL   HCT 38.6 36.0 - 46.0 %  MCV 85.2 80.0 - 100.0 fL   MCH 28.9 26.0 - 34.0 pg   MCHC 33.9 30.0 - 36.0 g/dL   RDW 14.6 11.5 - 15.5 %   Platelets 423 (H) 150 - 400 K/uL   nRBC 0.0 0.0 - 0.2 %    Comment:  Performed at [facility] [address], [place], [place] [address]  CBC     Status: Abnormal   Collection Time: 09/24/21  2:03 AM  Result Value Ref Range   WBC 4.4 4.0 - 10.5 K/uL   RBC 3.93 3.87 - 5.11 MIL/uL   Hemoglobin 11.2 (L) 12.0 - 15.0 g/dL   HCT 33.5 (L) 36.0 - 46.0 %   MCV 85.2 80.0 - 100.0 fL   MCH 28.5 26.0 - 34.0 pg   MCHC 33.4 30.0 - 36.0 g/dL   RDW 14.6 11.5 - 15.5 %   Platelets 413 (H) 150 - 400 K/uL   nRBC 0.0 0.0 - 0.2 %    Comment: Performed at [facility], [place] [address] [place], [place] [id number]  Basic metabolic panel     Status: Abnormal   Collection Time: 09/24/21  2:03 AM  Result Value Ref Range   Sodium 135 135 - 145 mmol/L   Potassium 3.5 3.5 - 5.1 mmol/L   Chloride 103 98 - 111 mmol/L   CO2 24 22 - 32 mmol/L   Glucose, Bld 112 (H) 70 - 99 mg/dL    Comment: Glucose reference range applies only to samples taken after fasting for at least 8 hours.   BUN 9 8 - 23 mg/dL   Creatinine, Ser 0.79 0.44 - 1.00 mg/dL   Calcium 9.0 8.9 - 10.3 mg/dL   GFR, Estimated >60 >60 mL/min    Comment: (NOTE) Calculated using the CKD-EPI Creatinine Equation (2021)    Anion gap 8 5 - 15    Comment: Performed at [facility] [address] [place], [place] [address]  CBC     Status: Abnormal   Collection Time: 09/24/21 12:34 PM  Result Value Ref Range   WBC 5.0 4.0 - 10.5 K/uL   RBC 3.86 (L) 3.87 - 5.11 MIL/uL   Hemoglobin 11.3 (L) 12.0 - 15.0 g/dL   HCT 33.2 (L) 36.0 - 46.0 %   MCV 86.0 80.0 - 100.0 fL   MCH 29.3 26.0 - 34.0 pg   MCHC 34.0 30.0 - 36.0 g/dL   RDW 14.7 11.5 - 15.5 %   Platelets 433 (H) 150 - 400 K/uL   nRBC 0.0 0.0 - 0.2 %    Comment: Performed at [facility] [address], [place], [place] [address]   DG Abd 1 View  Result Date: 09/24/2021 CLINICAL DATA:  Abdominal pain, vomiting EXAM: ABDOMEN - 1 VIEW COMPARISON:  None. FINDINGS: The bowel gas pattern is normal. No radio-opaque calculi or other significant  radiographic abnormality are seen. IMPRESSION: Nonobstructive pattern of bowel gas. No obvious free air in the abdomen on single supine radiograph. Electronically Signed   By: Delanna Ahmadi M.D.   On: 09/24/2021 14:20   CT HEAD WO CONTRAST (5MM)  Result Date: 09/23/2021 CLINICAL DATA:  Stroke, possible hemorrhagic conversion. EXAM: CT HEAD WITHOUT CONTRAST TECHNIQUE: Contiguous axial images were obtained from the base of the skull through the vertex without intravenous contrast. COMPARISON:  09/22/2021. FINDINGS: Brain: No acute intracranial hemorrhage, midline shift or mass effect. No extra-axial fluid collection. There is redemonstration of hypodense attenuation and  encephalomalacia region in the MCA territory on the left compatible with old infarct. A hypodense region is noted in the left cerebellar hemisphere, not significantly changed from the prior exam. Extensive subcortical and periventricular white matter hypodensities are noted bilaterally. There is no hydrocephalus. Vascular: Atherosclerotic calcification of the carotid siphons. No hyperdense vessel. Skull: Normal. Negative for fracture or focal lesion. Sinuses/Orbits: There is partial opacification of the ethmoid air cells and maxillary sinuses bilaterally. The orbits are stable. Other: None. IMPRESSION: 1. Hypodense region in the left cerebellar hemisphere, compatible with known acute infarct. No acute hemorrhage is identified. 2. Stable old infarct in the MCA territory on the left. 3. Atrophy with chronic microvascular ischemic changes. Electronically Signed   By: Brett Fairy M.D.   On: 09/23/2021 02:06   EEG adult  Result Date: 09/23/2021 Lora Havens, MD     09/23/2021 11:44 AM Patient Name: Lindsey [name] MRN: [id number] Epilepsy Attending: Lora Havens Referring Physician/Provider: Dr Donnetta Simpers Date: 09/23/2021 Duration: 23.57 mins Patient history: 65 year old female with seizure-like episode.  EEG to evaluate for  seizure. Level of alertness: Awake, asleep AEDs during EEG study: LEV Technical aspects: This EEG study was done with scalp electrodes positioned according to the 10-20 International system of electrode placement. Electrical activity was acquired at a sampling rate of 500Hz  and reviewed with a high frequency filter of 70Hz  and a low frequency filter of 1Hz . EEG data were recorded continuously and digitally stored. Description: The posterior dominant rhythm consists of 8 Hz activity of moderate voltage (25-35 uV) seen predominantly in posterior head regions, asymmetric ( left<right) and reactive to eye opening and eye closing. Sleep was characterized by vertex waves, sleep spindles (12 to 14 Hz), maximal frontocentral region.  EEG showed continuous 3 to 5 Hz theta Parenta slowing in left hemisphere.  Hyperventilation and photic stimulation were not performed.   ABNORMALITY -Continued slow, left hemisphere IMPRESSION: This study is suggestive of cortical dysfunction in left hemisphere, nonspecific etiology but could be secondary to underlying structural abnormality, postictal state.  No seizures or epileptiform discharges were seen throughout the recording. If suspicion for ictal-interictal activity persists, please consider prolonged EEG. Lora Havens   ECHOCARDIOGRAM COMPLETE  Result Date: 09/23/2021    ECHOCARDIOGRAM REPORT   Patient Name:   Lindsey [name] Date of Exam: 09/23/2021 Medical Rec #:  [id number]     Height:       64.0 in Accession #:    [id number]    Weight:       165.0 lb Date of Birth:  26-Mar-1956     BSA:          1.803 m Patient Age:    38 years      BP:           143/71 mmHg Patient Gender: F             HR:           86 bpm. Exam Location:  Inpatient Procedure: 2D Echo, Cardiac Doppler and Color Doppler Indications:    Stroke  History:        Patient has prior history of Echocardiogram examinations, most                 recent 04/22/2020. Stroke, Arrythmias:Atrial Fibrillation; Risk                  Factors:Hypertension.  Sonographer:    Glo Herring Referring Phys: [id number] [place]  1. Left ventricular  ejection fraction, by estimation, is 50 to 55%. The left ventricle has low normal function. The left ventricle has no regional wall motion abnormalities. Left ventricular diastolic function could not be evaluated.  2. Right ventricular systolic function is normal. The right ventricular size is normal. There is normal pulmonary artery systolic pressure.  3. Left atrial size was mild to moderately dilated.  4. The mitral valve is normal in structure. Trivial mitral valve regurgitation. No evidence of mitral stenosis.  5. Tricuspid valve regurgitation is mild to moderate.  6. The aortic valve is tricuspid. Aortic valve regurgitation is not visualized. No aortic stenosis is present.  7. The inferior vena cava is normal in size with <50% respiratory variability, suggesting right atrial pressure of 8 mmHg. Comparison(s): Changes from prior study are noted. Low normal EF on current study, in afib. FINDINGS  Left Ventricle: Left ventricular ejection fraction, by estimation, is 50 to 55%. The left ventricle has low normal function. The left ventricle has no regional wall motion abnormalities. The left ventricular internal cavity size was normal in size. There is no left ventricular hypertrophy. Left ventricular diastolic function could not be evaluated due to atrial fibrillation. Left ventricular diastolic function could not be evaluated. Right Ventricle: The right ventricular size is normal. Right vetricular wall thickness was not well visualized. Right ventricular systolic function is normal. There is normal pulmonary artery systolic pressure. The tricuspid regurgitant velocity is 2.58 m/s, and with an assumed right atrial pressure of 8 mmHg, the estimated right ventricular systolic pressure is [id number] mmHg. Left Atrium: Left atrial size was mild to moderately dilated. Right Atrium: Right  atrial size was not well visualized. Pericardium: There is no evidence of pericardial effusion. Mitral Valve: The mitral valve is normal in structure. Mild mitral annular calcification. Trivial mitral valve regurgitation. No evidence of mitral valve stenosis. Tricuspid Valve: The tricuspid valve is normal in structure. Tricuspid valve regurgitation is mild to moderate. No evidence of tricuspid stenosis. Aortic Valve: The aortic valve is tricuspid. Aortic valve regurgitation is not visualized. No aortic stenosis is present. Aortic valve mean gradient measures 2.0 mmHg. Aortic valve peak gradient measures 3.3 mmHg. Aortic valve area, by VTI measures 2.08 cm. Pulmonic Valve: The pulmonic valve was grossly normal. Pulmonic valve regurgitation is not visualized. No evidence of pulmonic stenosis. Aorta: The aortic root, ascending aorta, aortic arch and descending aorta are all structurally normal, with no evidence of dilitation or obstruction. Venous: The inferior vena cava is normal in size with less than 50% respiratory variability, suggesting right atrial pressure of 8 mmHg. IAS/Shunts: The interatrial septum was not well visualized.  LEFT VENTRICLE PLAX 2D LVIDd:         3.80 cm   Diastology LVIDs:         3.00 cm   LV e' medial:    6.54 cm/s LV PW:         1.00 cm   LV E/e' medial:  16.8 LV IVS:        1.00 cm   LV e' lateral:   8.71 cm/s LVOT diam:     1.90 cm   LV E/e' lateral: 12.6 LV SV:         38 LV SV Index:   21 LVOT Area:     2.84 cm  RIGHT VENTRICLE            IVC RV S prime:     7.62 cm/s  IVC diam: 2.00 cm LEFT ATRIUM  Index LA diam:        4.10 cm 2.27 cm/m LA Vol (A2C):   41.9 ml 23.24 ml/m LA Vol (A4C):   45.3 ml 25.13 ml/m LA Biplane Vol: 44.1 ml 24.46 ml/m  AORTIC VALVE AV Area (Vmax):    2.13 cm AV Area (Vmean):   2.03 cm AV Area (VTI):     2.08 cm AV Vmax:           90.40 cm/s AV Vmean:          67.100 cm/s AV VTI:            0.184 m AV Peak Grad:      3.3 mmHg AV Mean Grad:       2.0 mmHg LVOT Vmax:         67.90 cm/s LVOT Vmean:        48.000 cm/s LVOT VTI:          0.135 m LVOT/AV VTI ratio: 0.73  AORTA Ao Root diam: 3.10 cm Ao Asc diam:  3.00 cm MITRAL VALVE                TRICUSPID VALVE MV Area (PHT): 4.03 cm     TR Peak grad:   26.6 mmHg MV Decel Time: 188 msec     TR Vmax:        258.00 cm/s MV E velocity: 109.67 cm/s                             SHUNTS                             Systemic VTI:  0.14 m                             Systemic Diam: 1.90 cm Jodelle Red MD Electronically signed by Jodelle Red MD Signature Date/Time: 09/23/2021/2:57:32 PM    Final        Medical Problem List and Plan: 1. Functional deficits secondary to left anterior inferior cerebellar artery infarct  -patient may  shower  -ELOS/Goals: min assist with mobility and self-care, min -mod assist with language and cognition 2.  Antithrombotics: -DVT/anticoagulation:  Pharmaceutical: Other (comment) Eliquis  -antiplatelet therapy: Aspirin 81 mg 3. Pain Management: Tylenol 4. Mood: LCSW to provide emotional support  -antipsychotic agents: n/a 5. Neuropsych: This patient is not capable of making decisions on her own behalf. 6. Skin/Wound Care: routine skin care checks 7. Fluids/Electrolytes/Nutrition: Is and Os and follow-up chemistries 8. Possible seizure activity on Keppra 9: Pyelonephritis/UTI: culture + Klebsiella on Levaquin  -afebrile 10: Follow-up blood culture results; if positive, then TEE 11: Hypertension: amlodipine. Permissive hypertension (OK if < 220/120) but gradually normalize in 5-7 days  -adjust regimen as needed 12:Atrial fibrillation: on eliquis 13: Hyperlipidemia: on atorvastatin. LDL = [address] [place], [place] 09/24/2021

## 2021-09-24 NOTE — H&P (Signed)
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  She presented to the emergency department on September 19, 2021 with new onset of nausea, vomiting, diaphoresis and change in baseline mental status as reported by her family.  Negative report of loss of consciousness or seizure type activity.  No known fever or chills.  She lives at home with her daughter who provides care.  History of atrial fibrillation compliant with Eliquis.  As part of her work-up she underwent CT scan of the abdomen pelvis which revealed multi focal pyelonephritis of the right kidney.  Urology, Dr. Jeffie Pollock was contacted who reviewed the films and the patient's data.  He recommended infectious disease consultation.  There was also concern of hyperdensity within the gallbladder body and MRCP was obtained.  Gallbladder was within normal without mass.  She was started on antibiotics intravenously for pyelonephritis.  Her positive for Klebsiella.  She was transitioned to oral Levaquin on 12/19.     A CT of the head was also performed on admission which was negative, however, due to worsening speech, MRI was performed on 12/21 which revealed Acute moderate size Left AICA cerebellar infarct. .Underlying extensive chronic left MCA territory encephalomalacia related to the 2021 infarct. Associated left brainstem Wallerian degeneration since last year.And small superimposed chronic Left PICA cerebellar infarcts, but also new since last year.  Rapid response was called on 12/22 secondary to elevated heart rate responsiveness and foaming at her mouth.  She was turned to her side and the episode resolved within a minute.  Neurology evaluated and Keppra load of 1500 mg IV was given and she was started on Keppra 500 mg twice a day.  Aspirin 81 mg added.   2D echo was completed and negative. EF = estimated 50 to 55%. The patient requires inpatient medicine and rehabilitation evaluations and services for ongoing dysfunction secondary to stroke.   Currently, the patient is alert in no apparent distress.  Her grandson is at bedside.  Her attendant nurse reports episode of emesis.  The patient has not had a bowel movement since admission and a suppository is being administered.     Review of Systems  Constitutional:  Negative for chills and fever.  Gastrointestinal:  Positive for constipation and vomiting.  Neurological:  Positive for speech change and focal weakness.      Past Medical History:  Diagnosis Date   Diverticulosis     Hypertension     Uterine prolapse      History reviewed. No pertinent surgical history.      Family History  Problem Relation Age of Onset   Breast cancer Neg Hx      Social History:  reports that she has never smoked. She has never used smokeless tobacco. She reports that she does not currently use alcohol. She reports that she does not use drugs. Allergies: No Known Allergies       Medications Prior to Admission  Medication Sig Dispense Refill   acetaminophen (TYLENOL) 500 MG tablet Take 500 mg by mouth every 6 (six) hours as needed for moderate pain.       apixaban (ELIQUIS) 5 MG TABS tablet Take 1 tablet (5 mg total) by mouth 2 (two) times daily. 180 tablet 0   atorvastatin (LIPITOR) 20 MG tablet Take 1 tablet (20 mg total)  by mouth daily at 6 PM. 90 tablet 3   metoprolol tartrate (LOPRESSOR) 50 MG tablet Take 1 tablet (50 mg total) by mouth 2 (two) times daily. 60 tablet 0   Multiple Vitamins-Minerals (MULTIVITAMIN ADULTS 50+) TABS Take 1 tablet by mouth daily.       acetaminophen (TYLENOL) 325 MG tablet Take 1-2 tablets (325-650 mg total) by mouth every 4 (four) hours as needed for mild pain. (Patient not taking: Reported on 09/19/2021)       amLODipine (NORVASC) 5 MG tablet Take 1 tablet (5 mg total) by  mouth daily. (Patient not taking: Reported on 09/19/2021) 30 tablet 3      Drug Regimen Review  Drug regimen was reviewed and remains appropriate with no significant issues identified   Home: Home Living Family/patient expects to be discharged to:: Private residence Living Arrangements: Children Available Help at Discharge: Family Type of Home: House Home Access: Level entry Home Layout: Multi-level, Able to live on main level with bedroom/bathroom, 1/2 bath on main level Alternate Level Stairs-Number of Steps: flight, chair lift Bathroom Shower/Tub: Multimedia programmer: Programmer, systems: Yes Home Equipment: Civil engineer, contracting, Wheelchair - manual, Radio producer - single point, Conservation officer, nature (2 wheels), Other (comment), Hospital bed Additional Comments: Daughter provided PLOF and home living information via phone conversation; daughter reports since sunday pt has had increased food coming out of right side of mouth when eating, difficulty with word finding/slurred speech/unintelligible speech and pt does not appear as "sharp". pt baseline converses more than yes/no, speaking in more complete sentences  Lives With: Daughter   Functional History: Prior Function Prior Level of Function : Needs assist  Cognitive Assist : ADLs (cognitive) ADLs (Cognitive): Intermittent cues Physical Assist : Mobility (physical), ADLs (physical) Mobility (physical): Gait, Stairs ADLs (physical): Grooming, Bathing, Dressing, Toileting, IADLs Mobility Comments: pt ambulates with a cane and use of knee brace, requires assistance on carpeting due to reduced R foot clearance ADLs Comments: needs assistance with bathing, dressing, grooming, setup for feeding   Functional Status:  Mobility: Bed Mobility Overal bed mobility: Needs Assistance Bed Mobility: Supine to Sit Supine to sit: Mod assist, HOB elevated Sit to supine: Mod assist General bed mobility comments: assist for RLE and trunk  management Transfers Overall transfer level: Needs assistance Equipment used: Hemi-walker Transfers: Sit to/from Stand, Bed to chair/wheelchair/BSC Sit to Stand: Min assist, Mod assist Bed to/from chair/wheelchair/BSC transfer type:: Step pivot Stand pivot transfers: Mod assist Step pivot transfers: Mod assist General transfer comment: hemi walker used for transfer.  +2 provided to safely assist with sit to stand but patient was able to stand with min/mod assist from bed and chair today. max cues for safe hand placement with poor carryover. Ambulation/Gait Ambulation/Gait assistance: Mod assist, +2 safety/equipment, +2 physical assistance Gait Distance (Feet): 10 Feet Assistive device: Hemi-walker Gait Pattern/deviations: Step-to pattern, Narrow base of support, Trunk flexed, Decreased dorsiflexion - right, Decreased dorsiflexion - left General Gait Details: ~22ft pivotal steps to recliner, seated break, then ~68ft forward in room with close chair follow, initially min/modA +1 but pt needed +2 for safety with fatigue, needs assist to manage HW safely and for safe step sequencing, pt with ataxic R steps and decreased step length on LLE, pt fatigued but seemingly unaware of fatigue until instructed to sit. Per tele monitor HR ~140 bpm during gait trial and 100-120 bpm resting in chair Gait velocity: decreased Gait velocity interpretation: <1.31 ft/sec, indicative of household ambulator Pre-gait activities: pt with posterior lean and  able to step forward/sidestep with RLE but unable to offload LLE for stepping even wtih face to face maxA for weight shifting pt to LLE with knee blocked for stability. pt with possible LLE motor apraxia? difficulty also with some LLE seated exercise commands (hip flexion)   ADL: ADL Overall ADL's : Needs assistance/impaired Eating/Feeding: Minimal assistance Grooming: Wash/dry face, Wash/dry hands, Minimal assistance, Standing Grooming Details (indicate cue type  and reason): performed standing at sink with verbal cues for sequencing and min assist for balance Upper Body Bathing: Moderate assistance Lower Body Bathing: Moderate assistance, Sit to/from stand Upper Body Dressing : Moderate assistance, Sitting Lower Body Dressing: Maximal assistance, Sit to/from stand Lower Body Dressing Details (indicate cue type and reason): Patient was ablet to assist with doffing socks and and mod assist to donn socks with max assist for shoes Toilet Transfer: Moderate assistance, Buyer, retail Details (indicate cue type and reason): from EOB to recliner, using R AFO;face to face transfer, assist for weight shifting and blocking Rknee Toileting- Clothing Manipulation and Hygiene: Moderate assistance, Sit to/from stand Toileting - Clothing Manipulation Details (indicate cue type and reason): to powerup into standing and for stability Functional mobility during ADLs: Moderate assistance General ADL Comments: verbal cues for sequencing and increased time to follow commands   Cognition: Cognition Overall Cognitive Status: Difficult to assess Orientation Level: Oriented to person Cognition Arousal/Alertness: Awake/alert Behavior During Therapy: WFL for tasks assessed/performed Overall Cognitive Status: Difficult to assess Area of Impairment: Following commands Following Commands: Follows one step commands with increased time Problem Solving: Slow processing, Decreased initiation, Requires verbal cues, Requires tactile cues General Comments: aphasic but able to respond to yes/no questions, able to respond to some other questions although very dysarthric and difficult to understand, not oriented to day of week. She seemed to recall this PTA from previous day's session and was eager to get OOB today. Difficult to assess due to: Impaired communication   Physical Exam: Blood pressure (!) 154/88, pulse 74, temperature 98.9 F (37.2 C), temperature source  Oral, resp. rate 17, height 5\' 4"  (1.626 m), weight 74.8 kg, SpO2 100 %. Physical Exam HENT:     Head: Normocephalic.     Right Ear: External ear normal.     Left Ear: External ear normal.     Nose: Nose normal.     Mouth/Throat:     Mouth: Mucous membranes are moist.  Eyes:     Extraocular Movements: Extraocular movements intact.     Pupils: Pupils are equal, round, and reactive to light.  Cardiovascular:     Rate and Rhythm: Normal rate. Rhythm irregular.     Heart sounds: No murmur heard.   No gallop.  Pulmonary:     Effort: Pulmonary effort is normal.  Abdominal:     General: Abdomen is flat. There is no distension.     Palpations: Abdomen is soft.     Tenderness: There is no abdominal tenderness.  Musculoskeletal:        General: No swelling or deformity. Normal range of motion.     Cervical back: Normal range of motion. No tenderness.  Neurological:     Mental Status: She is alert.     Comments: Patient with closed eyes,opens to name and able to follow simple commands, RLE and Rue flaccid.aphasic. moves left side spontaneously. Responds to pain in all 4's.   Psychiatric:     Comments: Flat but does engage.      Lab Results Last 48 Hours  Results for orders placed or performed during the hospital encounter of 09/19/21 (from the past 48 hour(s))  Glucose, capillary     Status: Abnormal    Collection Time: 09/23/21  1:08 AM  Result Value Ref Range    Glucose-Capillary 121 (H) 70 - 99 mg/dL      Comment: Glucose reference range applies only to samples taken after fasting for at least 8 hours.  Hemoglobin A1c     Status: Abnormal    Collection Time: 09/23/21  2:33 AM  Result Value Ref Range    Hgb A1c MFr Bld 5.7 (H) 4.8 - 5.6 %      Comment: (NOTE) Pre diabetes:          5.7%-6.4%   Diabetes:              >6.4%   Glycemic control for   <7.0% adults with diabetes      Mean Plasma Glucose 116.89 mg/dL      Comment: Performed at [facility] [address] [place], [place] [address]  Lipid panel     Status: None    Collection Time: 09/23/21  2:33 AM  Result Value Ref Range    Cholesterol 113 0 - 200 mg/dL    Triglycerides 49 <150 mg/dL    HDL 42 >40 mg/dL    Total CHOL/HDL Ratio 2.7 RATIO    VLDL 10 0 - 40 mg/dL    LDL Cholesterol 61 0 - 99 mg/dL      Comment:        Total Cholesterol/HDL:CHD Risk Coronary Heart Disease Risk Table                     Men   Women  1/2 Average Risk   3.4   3.3  Average Risk       5.0   4.4  2 X Average Risk   9.6   7.1  3 X Average Risk  23.4   11.0        Use the calculated Patient Ratio above and the CHD Risk Table to determine the patient's CHD Risk.        ATP III CLASSIFICATION (LDL):  <100     mg/dL   Optimal  100-129  mg/dL   Near or Above                    Optimal  130-159  mg/dL   Borderline  160-189  mg/dL   High  >190     mg/dL   Very High Performed at [facility] [address], [place], [place] [id number]    Basic metabolic panel     Status: Abnormal    Collection Time: 09/23/21  2:33 AM  Result Value Ref Range    Sodium 134 (L) 135 - 145 mmol/L    Potassium 3.9 3.5 - 5.1 mmol/L    Chloride 99 98 - 111 mmol/L    CO2 21 (L) 22 - 32 mmol/L    Glucose, Bld 135 (H) 70 - 99 mg/dL      Comment: Glucose reference range applies only to samples taken after fasting for at least 8 hours.    BUN 8 8 - 23 mg/dL    Creatinine, Ser 0.79 0.44 - 1.00 mg/dL    Calcium 9.5 8.9 - 10.3 mg/dL    GFR, Estimated >60 >60 mL/min      Comment: (NOTE) Calculated using the  CKD-EPI Creatinine Equation (2021)      Anion gap 14 5 - 15      Comment: Performed at [facility], [place] [address], [place], [place] [address]  CBC     Status: Abnormal    Collection Time: 09/23/21  2:33 AM  Result Value Ref Range    WBC 5.5 4.0 - 10.5 K/uL    RBC 4.53 3.87 - 5.11 MIL/uL    Hemoglobin 13.1 12.0 - 15.0 g/dL    HCT 38.6 36.0 - 46.0 %    MCV 85.2 80.0 - 100.0 fL    MCH 28.9 26.0 - 34.0 pg     MCHC 33.9 30.0 - 36.0 g/dL    RDW 14.6 11.5 - 15.5 %    Platelets 423 (H) 150 - 400 K/uL    nRBC 0.0 0.0 - 0.2 %      Comment: Performed at [facility], [place] [address] [place], [place] [address]  CBC     Status: Abnormal    Collection Time: 09/24/21  2:03 AM  Result Value Ref Range    WBC 4.4 4.0 - 10.5 K/uL    RBC 3.93 3.87 - 5.11 MIL/uL    Hemoglobin 11.2 (L) 12.0 - 15.0 g/dL    HCT 33.5 (L) 36.0 - 46.0 %    MCV 85.2 80.0 - 100.0 fL    MCH 28.5 26.0 - 34.0 pg    MCHC 33.4 30.0 - 36.0 g/dL    RDW 14.6 11.5 - 15.5 %    Platelets 413 (H) 150 - 400 K/uL    nRBC 0.0 0.0 - 0.2 %      Comment: Performed at [facility], [place] [address], [place], [place] [id number]  Basic metabolic panel     Status: Abnormal    Collection Time: 09/24/21  2:03 AM  Result Value Ref Range    Sodium 135 135 - 145 mmol/L    Potassium 3.5 3.5 - 5.1 mmol/L    Chloride 103 98 - 111 mmol/L    CO2 24 22 - 32 mmol/L    Glucose, Bld 112 (H) 70 - 99 mg/dL      Comment: Glucose reference range applies only to samples taken after fasting for at least 8 hours.    BUN 9 8 - 23 mg/dL    Creatinine, Ser 0.79 0.44 - 1.00 mg/dL    Calcium 9.0 8.9 - 10.3 mg/dL    GFR, Estimated >60 >60 mL/min      Comment: (NOTE) Calculated using the CKD-EPI Creatinine Equation (2021)      Anion gap 8 5 - 15      Comment: Performed at [facility] [address], [place], [place] [address]  CBC     Status: Abnormal    Collection Time: 09/24/21 12:34 PM  Result Value Ref Range    WBC 5.0 4.0 - 10.5 K/uL    RBC 3.86 (L) 3.87 - 5.11 MIL/uL    Hemoglobin 11.3 (L) 12.0 - 15.0 g/dL    HCT 33.2 (L) 36.0 - 46.0 %    MCV 86.0 80.0 - 100.0 fL    MCH 29.3 26.0 - 34.0 pg    MCHC 34.0 30.0 - 36.0 g/dL    RDW 14.7 11.5 - 15.5 %    Platelets 433 (H) 150 - 400 K/uL    nRBC 0.0 0.0 - 0.2 %      Comment: Performed at [facility],  [address], [place], [place] [address]       Imaging Results (Last 48 hours)  DG  Abd 1 View   Result Date: 09/24/2021 CLINICAL DATA:  Abdominal pain, vomiting EXAM: ABDOMEN - 1 VIEW COMPARISON:  None. FINDINGS: The bowel gas pattern is normal. No radio-opaque calculi or other significant radiographic abnormality are seen. IMPRESSION: Nonobstructive pattern of bowel gas. No obvious free air in the abdomen on single supine radiograph. Electronically Signed   By: Delanna Ahmadi M.D.   On: 09/24/2021 14:20    CT HEAD WO CONTRAST (5MM)   Result Date: 09/23/2021 CLINICAL DATA:  Stroke, possible hemorrhagic conversion. EXAM: CT HEAD WITHOUT CONTRAST TECHNIQUE: Contiguous axial images were obtained from the base of the skull through the vertex without intravenous contrast. COMPARISON:  09/22/2021. FINDINGS: Brain: No acute intracranial hemorrhage, midline shift or mass effect. No extra-axial fluid collection. There is redemonstration of hypodense attenuation and encephalomalacia region in the MCA territory on the left compatible with old infarct. A hypodense region is noted in the left cerebellar hemisphere, not significantly changed from the prior exam. Extensive subcortical and periventricular white matter hypodensities are noted bilaterally. There is no hydrocephalus. Vascular: Atherosclerotic calcification of the carotid siphons. No hyperdense vessel. Skull: Normal. Negative for fracture or focal lesion. Sinuses/Orbits: There is partial opacification of the ethmoid air cells and maxillary sinuses bilaterally. The orbits are stable. Other: None. IMPRESSION: 1. Hypodense region in the left cerebellar hemisphere, compatible with known acute infarct. No acute hemorrhage is identified. 2. Stable old infarct in the MCA territory on the left. 3. Atrophy with chronic microvascular ischemic changes. Electronically Signed   By: Brett Fairy M.D.   On: 09/23/2021 02:06    EEG adult   Result Date: 09/23/2021 Lora Havens, MD     09/23/2021 11:44 AM Patient Name: Lindsey [name] MRN: [id number]  Epilepsy Attending: Lora Havens Referring Physician/Provider: Dr Donnetta Simpers Date: 09/23/2021 Duration: 23.57 mins Patient history: 65 year old female with seizure-like episode.  EEG to evaluate for seizure. Level of alertness: Awake, asleep AEDs during EEG study: LEV Technical aspects: This EEG study was done with scalp electrodes positioned according to the 10-20 International system of electrode placement. Electrical activity was acquired at a sampling rate of 500Hz  and reviewed with a high frequency filter of 70Hz  and a low frequency filter of 1Hz . EEG data were recorded continuously and digitally stored. Description: The posterior dominant rhythm consists of 8 Hz activity of moderate voltage (25-35 uV) seen predominantly in posterior head regions, asymmetric ( left<right) and reactive to eye opening and eye closing. Sleep was characterized by vertex waves, sleep spindles (12 to 14 Hz), maximal frontocentral region.  EEG showed continuous 3 to 5 Hz theta Parenta slowing in left hemisphere.  Hyperventilation and photic stimulation were not performed.   ABNORMALITY -Continued slow, left hemisphere IMPRESSION: This study is suggestive of cortical dysfunction in left hemisphere, nonspecific etiology but could be secondary to underlying structural abnormality, postictal state.  No seizures or epileptiform discharges were seen throughout the recording. If suspicion for ictal-interictal activity persists, please consider prolonged EEG. Lora Havens    ECHOCARDIOGRAM COMPLETE   Result Date: 09/23/2021    ECHOCARDIOGRAM REPORT   Patient Name:   Lindsey [name] Date of Exam: 09/23/2021 Medical Rec #:  [id number]     Height:       64.0 in Accession #:    [id number]    Weight:       165.0 lb Date of  Birth:  1955-11-12     BSA:          1.803 m Patient Age:    4 years      BP:           143/71 mmHg Patient Gender: F             HR:           86 bpm. Exam Location:  Inpatient Procedure: 2D Echo, Cardiac  Doppler and Color Doppler Indications:    Stroke  History:        Patient has prior history of Echocardiogram examinations, most                 recent 04/22/2020. Stroke, Arrythmias:Atrial Fibrillation; Risk                 Factors:Hypertension.  Sonographer:    Glo Herring Referring Phys: [id number] [place]  1. Left ventricular ejection fraction, by estimation, is 50 to 55%. The left ventricle has low normal function. The left ventricle has no regional wall motion abnormalities. Left ventricular diastolic function could not be evaluated.  2. Right ventricular systolic function is normal. The right ventricular size is normal. There is normal pulmonary artery systolic pressure.  3. Left atrial size was mild to moderately dilated.  4. The mitral valve is normal in structure. Trivial mitral valve regurgitation. No evidence of mitral stenosis.  5. Tricuspid valve regurgitation is mild to moderate.  6. The aortic valve is tricuspid. Aortic valve regurgitation is not visualized. No aortic stenosis is present.  7. The inferior vena cava is normal in size with <50% respiratory variability, suggesting right atrial pressure of 8 mmHg. Comparison(s): Changes from prior study are noted. Low normal EF on current study, in afib. FINDINGS  Left Ventricle: Left ventricular ejection fraction, by estimation, is 50 to 55%. The left ventricle has low normal function. The left ventricle has no regional wall motion abnormalities. The left ventricular internal cavity size was normal in size. There is no left ventricular hypertrophy. Left ventricular diastolic function could not be evaluated due to atrial fibrillation. Left ventricular diastolic function could not be evaluated. Right Ventricle: The right ventricular size is normal. Right vetricular wall thickness was not well visualized. Right ventricular systolic function is normal. There is normal pulmonary artery systolic pressure. The tricuspid regurgitant  velocity is 2.58 m/s, and with an assumed right atrial pressure of 8 mmHg, the estimated right ventricular systolic pressure is [id number] mmHg. Left Atrium: Left atrial size was mild to moderately dilated. Right Atrium: Right atrial size was not well visualized. Pericardium: There is no evidence of pericardial effusion. Mitral Valve: The mitral valve is normal in structure. Mild mitral annular calcification. Trivial mitral valve regurgitation. No evidence of mitral valve stenosis. Tricuspid Valve: The tricuspid valve is normal in structure. Tricuspid valve regurgitation is mild to moderate. No evidence of tricuspid stenosis. Aortic Valve: The aortic valve is tricuspid. Aortic valve regurgitation is not visualized. No aortic stenosis is present. Aortic valve mean gradient measures 2.0 mmHg. Aortic valve peak gradient measures 3.3 mmHg. Aortic valve area, by VTI measures 2.08 cm. Pulmonic Valve: The pulmonic valve was grossly normal. Pulmonic valve regurgitation is not visualized. No evidence of pulmonic stenosis. Aorta: The aortic root, ascending aorta, aortic arch and descending aorta are all structurally normal, with no evidence of dilitation or obstruction. Venous: The inferior vena cava is normal in size with less than 50% respiratory variability, suggesting right atrial pressure  of 8 mmHg. IAS/Shunts: The interatrial septum was not well visualized.  LEFT VENTRICLE PLAX 2D LVIDd:         3.80 cm   Diastology LVIDs:         3.00 cm   LV e' medial:    6.54 cm/s LV PW:         1.00 cm   LV E/e' medial:  16.8 LV IVS:        1.00 cm   LV e' lateral:   8.71 cm/s LVOT diam:     1.90 cm   LV E/e' lateral: 12.6 LV SV:         38 LV SV Index:   21 LVOT Area:     2.84 cm  RIGHT VENTRICLE            IVC RV S prime:     7.62 cm/s  IVC diam: 2.00 cm LEFT ATRIUM             Index LA diam:        4.10 cm 2.27 cm/m LA Vol (A2C):   41.9 ml 23.24 ml/m LA Vol (A4C):   45.3 ml 25.13 ml/m LA Biplane Vol: 44.1 ml 24.46 ml/m  AORTIC  VALVE AV Area (Vmax):    2.13 cm AV Area (Vmean):   2.03 cm AV Area (VTI):     2.08 cm AV Vmax:           90.40 cm/s AV Vmean:          67.100 cm/s AV VTI:            0.184 m AV Peak Grad:      3.3 mmHg AV Mean Grad:      2.0 mmHg LVOT Vmax:         67.90 cm/s LVOT Vmean:        48.000 cm/s LVOT VTI:          0.135 m LVOT/AV VTI ratio: 0.73  AORTA Ao Root diam: 3.10 cm Ao Asc diam:  3.00 cm MITRAL VALVE                TRICUSPID VALVE MV Area (PHT): 4.03 cm     TR Peak grad:   26.6 mmHg MV Decel Time: 188 msec     TR Vmax:        258.00 cm/s MV E velocity: 109.67 cm/s                             SHUNTS                             Systemic VTI:  0.14 m                             Systemic Diam: 1.90 cm Buford Dresser MD Electronically signed by Buford Dresser MD Signature Date/Time: 09/23/2021/2:57:32 PM    Final              Medical Problem List and Plan: 1. Functional deficits secondary to left anterior inferior cerebellar artery infarct             -patient may  shower             -ELOS/Goals: min assist with mobility and self-care, min -mod assist with language and cognition 2.  Antithrombotics: -DVT/anticoagulation:  Pharmaceutical: Other (comment) Eliquis             -antiplatelet therapy: Aspirin 81 mg 3. Pain Management: Tylenol 4. Mood: LCSW to provide emotional support             -antipsychotic agents: n/a 5. Neuropsych: This patient is not capable of making decisions on her own behalf. 6. Skin/Wound Care: routine skin care checks 7. Fluids/Electrolytes/Nutrition: Is and Os and follow-up chemistries 8. Possible seizure activity on Keppra 9: Pyelonephritis/UTI: culture + Klebsiella on Levaquin             -afebrile 10: Follow-up blood culture results; if positive, then TEE 11: Hypertension: amlodipine. Permissive hypertension (OK if < 220/120) but gradually normalize in 5-7 days             -adjust regimen as needed 12:Atrial fibrillation: on eliquis 13:  Hyperlipidemia: on atorvastatin. LDL = [address]         Lynnell Jude [place], [place] 09/24/2021   I have personally performed a face to face diagnostic evaluation of this patient and formulated the key components of the plan.  Additionally, I have personally reviewed laboratory data, imaging studies, as well as relevant notes and concur with the physician assistant's documentation above.  The patient's status has not changed from the original H&P.  Any changes in documentation from the acute care chart have been noted above.  Ranelle Oyster, MD, [name]

## 2021-09-24 NOTE — Progress Notes (Incomplete)
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 Patient had persistent neuro deficits with resolution of N/V and diaphoresis, so MRI was obtained, showing a new stroke (not captured on initial CT).    Overnight Events: No acute events reported  Interim History: On interview and assessment the patient is alert and responds to questions with head nods and vocalizes yes/no. She reports feeling better but often keeps repeating "yes". Endorses frustration with not being able to get words out. Denies back pain at this time.  Uses walker to assist with ambulation at home.   OBJECTIVE:  Vital Signs: Vitals:   09/23/21 2208 09/23/21 2351 09/24/21 0309 09/24/21 0823  BP: 106/71 112/74 95/70 109/75  Pulse: 76 (!) 56 84 90  Resp:  16 17 20   Temp:  98.6 F (37 C) 98.8 F (37.1 C) 98.9 F (37.2 C)  TempSrc:  Oral Oral Oral  SpO2:  100% 100% 100%  Weight:      Height:          Intake/Output Summary (Last 24 hours) at 09/24/2021 1034 Last data filed at 09/24/2021 0455 Gross per 24 hour  Intake 530 ml  Output 400 ml  Net 130 ml    Net IO Since Admission: -1,439.66 mL [09/24/21 1034]  Physical Exam: Physical Exam Vitals reviewed.  Cardiovascular:     Rate and Rhythm: Normal rate. Rhythm irregular.     Pulses: Normal pulses.     Heart sounds: No murmur heard. Pulmonary:     Effort: Pulmonary effort is normal.     Breath sounds: Normal breath sounds.  Abdominal:     General: Bowel sounds are normal.     Palpations: Abdomen is soft.     Tenderness: There is abdominal tenderness.  Neurological:     Mental Status: She is alert.     Comments: Patient responds with head nods, does not spontaneously produce speech. 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 Patient had persistent neuro deficits with resolution of N/V and diaphoresis, so MRI was obtained showing a new stroke (not captured on initial CT).   #Atrial fibrillation #Hx L MCA stroke with R sided hemiplegia and aphasia #New AICA cerebellar stroke and PICA infarcts #Potential seizure CTH negative for acute stroke on admission. Patient had persistent dysarthria after resolution of other symptoms and start of antibiotics. MRI-brain obtained showing acute left AICA cerebellar infarct without hemorrhagic transformation as well as small left PICA infarcts, indicative of embolic strokes.  Patient had a potential seizure overnight as described above. EEG with no evidence of seizure or epileptiform discharges. SLP eval with rec for regular diet. CTA with patent vasculature.  Risk stratification labs notable for A1C of 5.7 and a normal lipid panel.  -Neurology consult, appreciate recs -SLP follow up for both swallow and language -Continue Keppra 500 mg q12hrs -Restart Eliquis 5 mg BID, per neuro -Lopressor 50 mg BID -Atorvastatin 20 mg daily  #Vomiting, diaphoresis, malaise #Multifocal pyelonephritis (chronic?) MRI-Abdomen was notable for a 3.5 cm developing fluid collection on the upper pole  of the right kidney, concern for abscess. Ucx growing 100K GNR, found to be Klebsiella, resistant to ampicillin, Bactrim, and Macrobid. Patient without fever or leukocytosis. Urology consulted and will f/u as an outpatient. -ID consult, appreciate recs -Change cefazolin to Levaquin for at least a 4-week course -Repeat imagine in 2-3 weeks -ID follow up on Jan 16th with Dr. Baxter Flattery  #HTN -Home Amlodipine 10 mg   #Chronic normocytic anemia Stable from last year, hgb at 10. Unknown last colonoscopy.   #Elevated troponin Trops flat from 33 to 42 to 34. Patient denies CP.  -no further workup   #Gallbladder wall mass (resolved) 0.9 cm mass on the gallbladder wall turned out to be a mucosal fold.  -No f/u needed  #Hypokalemia (resolved)    FEN/GI: regular VTE: Eliquis Dispo: CIR Code Status: FULL  Signature: Corky Sox, MD PGY-1 Pager: [phone]  Please contact the on call pager after 5 pm and on weekends at [phone].

## 2021-09-24 NOTE — Progress Notes (Addendum)
Inpatient Rehab Admissions Coordinator:   I have a bed for pt on CIR today. Discussed with Dr. Riley Kill and Dr. Viviann Spare.  Okay to bring patient with pending blood cultures and follow for results on CIR.  Awaiting final confirmation from TS that she can come today.  Pt and TOC team aware.  Will call daughter once I have a final update.   Addendum: Confirmed pt can come to CIR today. Will let daughter know.  Estill Dooms, PT, DPT Admissions Coordinator [phone] 09/24/21  11:56 AM

## 2021-09-24 NOTE — Progress Notes (Signed)
Patient was transferred back to bed from recliner; once in bed she became  nausated and vomited; medication prn for nausea.  No recorded bm noted; MD called to bedside; and orders received for patient's c/o stomach discomfort.

## 2021-09-24 NOTE — Final Progress Note (Signed)
PMR Admission Coordinator Pre-Admission Assessment   Patient: Lindsey [name] is an 65 y.o., female MRN: [id number] DOB: September 04, 1956 Height: _0  (162.6 cm) Weight: 74.8 kg   Insurance Information HMO:     PPO:      PCP:      IPA:      80/20:      OTHER:  PRIMARY: Medicare A and B      Policy#: [id number]      Subscriber: pt CM Name:       Phone#:      Fax#:  Pre-Cert#: verified Civil engineer, contracting:  Benefits:  Phone #:      Name:  Eff. Date: 03/03/21 A and B     Deduct: $1556 (2023 $1600)      Out of Pocket Max: n/a      Life Max: n/a CIR: 100%      SNF: 20 full days Outpatient: 80%     Co-Ins: 20% Home Health: 100%      Co-Pay:  DME: 80%     Co-Ins: 20% Providers:  SECONDARY: Medicaid of [place]      Policy#: [id number]     Phone#: [phone]   Financial Counselor:       Phone#:    The Data Collection Information Summary for patients in Inpatient Rehabilitation Facilities with attached Privacy Act [place] Records was provided and verbally reviewed with: Family   Emergency Contact Information Contact Information       Name Relation Home Work Mobile    [place] Daughter [phone]   [phone]    Claybon Jabs     [phone]           Current Medical History  Patient Admitting Diagnosis: CVA    History of Present Illness: Pt is a 65 y/o female with PMH of LMCA in 2021 (on CIR), Afib, and HTN, who presents to [facility] on 12/18 with N/V diaphoresis, and malaise.  Daughter is primary caregiver and provides history in ED.  Workup revealed pyleonephritis for which she was started on rocephin.  While admitted pt with multiple episodes of potential seizure.  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  EEG with no evidence of seizure or epileptiform discahrges.  CTA with patent vasculature.  Neurology recommended blood cultures and possible TEE (would be completed on CIR if needed) to r/o endocarditis but pt already on abx.  Loaded  with Keppra then 500 mg BID.  Therapy ongoing and pt was recommended for CIR.    Complete NIHSS TOTAL: 16   Patient's medical record from [facility] has been reviewed by the rehabilitation admission coordinator and physician.   Past Medical History      Past Medical History:  Diagnosis Date   Diverticulosis     Hypertension     Uterine prolapse        Has the patient had major surgery during 100 days prior to admission? No   Family History   family history is not on file.   Current Medications   Current Facility-Administered Medications:    amLODipine (NORVASC) tablet 5 mg, 5 mg, Oral, Daily, Allyson Sabal, Sagar, MD, 5 mg at 09/24/21 1125   apixaban (ELIQUIS) tablet 5 mg, 5 mg, Oral, BID, Narendra, Nischal, MD, 5 mg at 09/24/21 1126   aspirin EC tablet 81 mg, 81 mg, Oral, Daily, Jones, Jessica L, NP, 81 mg at 09/24/21 1124   atorvastatin (LIPITOR) tablet 20 mg, 20 mg, Oral, q1800,  Virl Axe, MD, 20 mg at 09/23/21 1653   levETIRAcetam (KEPPRA) IVPB 500 mg/100 mL premix, 500 mg, Intravenous, Q12H, Last Rate: 400 mL/hr at 09/23/21 2212, 500 mg at 09/23/21 2212 **OR** levETIRAcetam (KEPPRA) tablet 500 mg, 500 mg, Oral, Q12H, Donnetta Simpers, MD, 500 mg at 09/24/21 1125   levofloxacin (LEVAQUIN) tablet 750 mg, 750 mg, Oral, Daily, Corky Sox, MD, 750 mg at 09/24/21 1126   metoprolol tartrate (LOPRESSOR) tablet 50 mg, 50 mg, Oral, BID, Corky Sox, MD, 50 mg at 09/24/21 1126   ondansetron [facility]) tablet 4 mg, 4 mg, Oral, Q12H PRN, Corky Sox, MD   Patients Current Diet:  Diet Order                  Diet regular Room service appropriate? No; Fluid consistency: Thin  Diet effective now                         Precautions / Restrictions Precautions Precautions: Fall Precaution Comments: R hemiplegia and global aphasia Restrictions Weight Bearing Restrictions: No    Has the patient had 2 or more falls or a fall with injury in the past year? No   Prior  Activity Level Household: able to ambulate household distances with [facility] and supervision, assist for ADLs from daughter   Prior Functional Level Self Care: Did the patient need help bathing, dressing, using the toilet or eating? Needed some help   Indoor Mobility: Did the patient need assistance with walking from room to room (with or without device)? Needed some help   Stairs: Did the patient need assistance with internal or external stairs (with or without device)? Dependent   Functional Cognition: Did the patient need help planning regular tasks such as shopping or remembering to take medications? Needed some help   Patient Information Are you of Hispanic, Latino/a,or Spanish origin?: A. No, not of Hispanic, Latino/a, or Spanish origin (all questions answered by daughter) What is your race?: B. Black or African American Do you need or want an interpreter to communicate with a doctor or health care staff?: 0. No   Patient's Response To:  Health Literacy and Transportation Is the patient able to respond to health literacy and transportation needs?: No Health Literacy - How often do you need to have someone help you when you read instructions, pamphlets, or other written material from your doctor or pharmacy?: Patient unable to respond In the past 12 months, has lack of transportation kept you from medical appointments or from getting medications?: No (per dtr) In the past 12 months, has lack of transportation kept you from meetings, work, or from getting things needed for daily living?: No   Development worker, international aid / [place]: Civil engineer, contracting, Wheelchair - manual, Radio producer - single point, Conservation officer, nature (2 wheels), Other (comment), Hospital bed   Prior Device Use: Indicate devices/aids used by the patient prior to current illness, exacerbation or injury? Manual wheelchair, Walker, Orthotics/Prosthetics, and cane   Current Functional Level Cognition   Overall Cognitive Status:  Difficult to assess Difficult to assess due to: Impaired communication Orientation Level: Oriented to person Following Commands: Follows one step commands with increased time General Comments: aphasic but able to respond to yes/no questions    Extremity Assessment (includes Sensation/Coordination)   Upper Extremity Assessment: RUE deficits/detail RUE Deficits / Details: hx of hemiplegia from previous CVA. Pt with flexion contracture. LUE Deficits / Details: overall WFL;grossly 3+/5;poor control when attempting to write, pt was  right handed at baseline LUE Coordination: decreased fine motor  Lower Extremity Assessment: RLE deficits/detail, LLE deficits/detail, Defer to PT evaluation RLE Deficits / Details: 3+/5 knee extension, 3/5 PF/DF;pt has AFO LLE Deficits / Details: noted decreased quad activation with weight shifting, noted increased knee buckling LLE;knee flexion/extension 3+/5;hip flexion 2/5; pt endorsed "yes" that LLE felt weaker this date     ADLs   Overall ADL's : Needs assistance/impaired Eating/Feeding: Minimal assistance Grooming: Wash/dry face, Wash/dry hands, Minimal assistance, Standing Grooming Details (indicate cue type and reason): performed standing at sink with verbal cues for sequencing and min assist for balance Upper Body Bathing: Moderate assistance Lower Body Bathing: Moderate assistance, Sit to/from stand Upper Body Dressing : Moderate assistance, Sitting Lower Body Dressing: Maximal assistance, Sit to/from stand Lower Body Dressing Details (indicate cue type and reason): Patient was ablet to assist with doffing socks and and mod assist to donn socks with max assist for shoes Toilet Transfer: Moderate assistance, Buyer, retail Details (indicate cue type and reason): from EOB to recliner, using R AFO;face to face transfer, assist for weight shifting and blocking Rknee Toileting- Clothing Manipulation and Hygiene: Moderate assistance, Sit to/from  stand Toileting - Clothing Manipulation Details (indicate cue type and reason): to powerup into standing and for stability Functional mobility during ADLs: Moderate assistance General ADL Comments: verbal cues for sequencing and increased time to follow commands     Mobility   Overal bed mobility: Needs Assistance Bed Mobility: Supine to Sit Supine to sit: Mod assist, HOB elevated Sit to supine: Mod assist General bed mobility comments: assist for RLE and trunk management     Transfers   Overall transfer level: Needs assistance Equipment used: Hemi-walker Transfers: Sit to/from Stand, Bed to chair/wheelchair/BSC Sit to Stand: Min assist, Mod assist Bed to/from chair/wheelchair/BSC transfer type:: Step pivot Stand pivot transfers: Mod assist Step pivot transfers: Mod assist General transfer comment: hemi walker used for transfer.  +2 provided to assist with sit to stand but patient was able to stand with min/mod assist     Ambulation / Gait / Stairs / Wheelchair Mobility   Ambulation/Gait Ambulation/Gait assistance: Mod assist Gait Distance (Feet): 2 Feet Assistive device: 1 person hand held assist Gait Pattern/deviations: Step-to pattern, Narrow base of support General Gait Details: Pt taking ~2 steps forward and back x 2, advancing RLE forwards initially, but unable to advance LLE due to incoordination Gait velocity: decreased Gait velocity interpretation: <1.31 ft/sec, indicative of household ambulator Pre-gait activities: pt with posterior lean and able to step forward/sidestep with RLE but unable to offload LLE for stepping even wtih face to face maxA for weight shifting pt to LLE with knee blocked for stability. pt with possible LLE motor apraxia? difficulty also with some LLE seated exercise commands (hip flexion)     Posture / Balance Dynamic Sitting Balance Sitting balance - Comments: able to assist with donning footwear while sitting on EOB Balance Overall balance  assessment: Needs assistance Sitting-balance support: No upper extremity supported, Feet supported Sitting balance-Leahy Scale: Fair Sitting balance - Comments: able to assist with donning footwear while sitting on EOB Standing balance support: Single extremity supported Standing balance-Leahy Scale: Poor Standing balance comment: able to stand at sink for grooming with min assist for balance with sink providing support     Special needs/care consideration Continuous Drip IV  cefazolin 2g (262m/hr); Keppra 500 mg (4035mhr)    Previous Home Environment (from acute therapy documentation) Living Arrangements: Children  Lives With: Daughter Available  Help at Discharge: Family Type of Home: House Home Layout: Multi-level, Able to live on main level with bedroom/bathroom, 1/2 bath on main level Alternate Level Stairs-Number of Steps: flight, chair lift Home Access: Level entry Bathroom Shower/Tub: Multimedia programmer: Standard Bathroom Accessibility: Yes How Accessible: Accessible via walker Additional Comments: Daughter provided PLOF and home living information via phone conversation; daughter reports since sunday pt has had increased food coming out of right side of mouth when eating, difficulty with word finding/slurred speech/unintelligible speech and pt does not appear as "sharp". pt baseline converses more than yes/no, speaking in more complete sentences   Discharge Living Setting Plans for Discharge Living Setting: Lives with (comment) (daughter, [name]) Type of Home at Discharge: House Discharge Home Layout: Two level, 1/2 bath on main level (sleeps downstairs, but bathes upstairs) Alternate Level Stairs-Rails:  (stair lift) Alternate Level Stairs-Number of Steps: full flight Discharge Home Access: Stairs to enter, Ramped entrance Entrance Stairs-Rails: None Entrance Stairs-Number of Steps: 1 Discharge Bathroom Shower/Tub: Walk-in shower (threshold) Discharge  Bathroom Toilet: Standard (BSC over toilet) Discharge Bathroom Accessibility: Yes How Accessible: Accessible via wheelchair Does the patient have any problems obtaining your medications?: No   Social/Family/Support Systems Anticipated Caregiver: pt's daughter, [name] Anticipated Caregiver's Contact Information: [name] [phone] Ability/Limitations of Caregiver: providing care for pt prior to admit Caregiver Availability: 24/7 Discharge Plan Discussed with Primary Caregiver: Yes Is Caregiver In Agreement with Plan?: Yes Does Caregiver/Family have Issues with Lodging/Transportation while Pt is in Rehab?: No   Goals Patient/Family Goal for Rehab: PT/OT min assist, SLP min to mod assist Expected length of stay: 12-14 days Pt/Family Agrees to Admission and willing to participate: Yes Program Orientation Provided & Reviewed with Pt/Caregiver Including Roles  & Responsibilities: Yes   Decrease burden of Care through IP rehab admission: n/a   Possible need for SNF placement upon discharge: No   Patient Condition: I have reviewed medical records from [facility], spoken with  [facility] team , and patient and daughter. I met with patient at the bedside and discussed via phone for inpatient rehabilitation assessment.  Patient will benefit from ongoing PT, OT, and SLP, can actively participate in 3 hours of therapy a day 5 days of the week, and can make measurable gains during the admission.  Patient will also benefit from the coordinated team approach during an Inpatient Acute Rehabilitation admission.  The patient will receive intensive therapy as well as Rehabilitation physician, nursing, social worker, and care management interventions.  Due to bladder management, bowel management, safety, skin/wound care, disease management, medication administration, pain management, and patient education the patient requires 24 hour a day rehabilitation nursing.  The patient is currently min to mod assist  with mobility and basic ADLs.  Discharge setting and therapy post discharge at home with home health is anticipated.  Patient has agreed to participate in the Acute Inpatient Rehabilitation Program and will admit today.   Preadmission Screen Completed By:  Michel Santee, PT, DPT 09/24/2021 12:02 PM ______________________________________________________________________   Discussed status with Dr. Naaman Plummer on 09/24/21  at 12:02 PM  and received approval for admission today.   Admission Coordinator:  Michel Santee, PT, DPT time 12:02 PM Sudie Grumbling 09/24/21     Assessment/Plan: Diagnosis: AICIA/cerebellar infarct Does the need for close, 24 hr/day Medical supervision in concert with the patient's rehab needs make it unreasonable for this patient to be served in a less intensive setting? Yes Co-Morbidities requiring supervision/potential complications: htn, prior aphasia, afib Due to  bladder management, bowel management, safety, skin/wound care, disease management, medication administration, pain management, and patient education, does the patient require 24 hr/day rehab nursing? Yes Does the patient require coordinated care of a physician, rehab nurse, PT, OT, and SLP to address physical and functional deficits in the context of the above medical diagnosis(es)? Yes Addressing deficits in the following areas: balance, endurance, locomotion, strength, transferring, bowel/bladder control, bathing, dressing, feeding, grooming, toileting, cognition, language, swallowing, and psychosocial support Can the patient actively participate in an intensive therapy program of at least 3 hrs of therapy 5 days a week? Yes The potential for patient to make measurable gains while on inpatient rehab is excellent Anticipated functional outcomes upon discharge from inpatient rehab: min assist PT, min assist OT, min assist and mod assist SLP Estimated rehab length of stay to reach the above functional goals is: 12-14  days Anticipated discharge destination: Home 10. Overall Rehab/Functional Prognosis: excellent     MD Signature: Meredith Staggers, MD, [place] Director Rehabilitation Services

## 2021-09-24 NOTE — Progress Notes (Signed)
Inpatient Rehabilitation Admission Medication Review by a Pharmacist  A complete drug regimen review was completed for this patient to identify any potential clinically significant medication issues.  High Risk Drug Classes Is patient taking? Indication by Medication  Antipsychotic Yes Compazine- N/V  Anticoagulant Yes Apixaban- A. fib  Antibiotic Yes Levaquin- multifocal pyelonephritis  Opioid No   Antiplatelet Yes Aspirin- CVA prophylaxis  Hypoglycemics/insulin No   Vasoactive Medication Yes Norvasc, lopressor- hypertension  Chemotherapy No   Other Yes Trazodone- sleep Lipitor- HLD Keppra- seizure prophylaxis s/p CVA Senna/Miralax- bowel regimen     Type of Medication Issue Identified Description of Issue Recommendation(s)  Drug Interaction(s) (clinically significant)     Duplicate Therapy     Allergy     No Medication Administration End Date     Incorrect Dose     Additional Drug Therapy Needed     Significant med changes from prior encounter (inform family/care partners about these prior to discharge).    Other       Clinically significant medication issues were identified that warrant physician communication and completion of prescribed/recommended actions by midnight of the next day:  No  Pharmacist notes:  The following excerpts were pulled from consultation notes and the discharge summary that are pertinent to the current admission:  "No BM for several days. Adj regimen as needed" -Discharge summary 09/24/2021  "Permissive hypertension (OK if < 220/120) but gradually normalize in 5-7 days (12/22 to 12/29)" - Neuro Dr. Pearlean Brownie 09/23/2021 consult  "Given abscess, will transition to oral levofloxacin. Repeat imaging in around 2-3 weeks and if improving potentially can transition to beta-lactam to finish abx course (planning 4 weeks starting 12/21) if unable to tolerate quinolone."- Infectious disease consult note dated 09/22/2021  Time spent performing this drug  regimen review (minutes):  30   Chanah Tidmore BS, PharmD, BCPS Clinical Pharmacist 09/24/2021 4:47 PM

## 2021-09-24 NOTE — Progress Notes (Signed)
No results from suppository; continue bowel regimen for constipation.

## 2021-09-24 NOTE — Progress Notes (Signed)
Physical Therapy Treatment Patient Details Name: Lindsey [name] MRN: [id number] DOB: 1956/02/14 Today's Date: 09/24/2021   History of Present Illness 65 y.o. female presents to [facility] hospital on 09/19/2021 with nausea/vomiting, diaphoresis, and malaise.  Pt admitted for management of R sided pyelonephritis with developing renal abscess. 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    PT Comments    Pt received in supine, eagerly agreeable to therapy session and with improved command following this date. Pt able to initiate gait trial in room away from bed ~49ft with up to +2 modA and needs only +1 assist for stand pivot transfer to chair using [place]. Pt tachy to ~140 bpm during gait trial, RN notified. No other acute s/sx distress and HR down to 100-115 bpm after seated rest break ~2 mins. Pt continues to benefit from PT services to progress toward functional mobility goals.    Recommendations for follow up therapy are one component of a multi-disciplinary discharge planning process, led by the attending physician.  Recommendations may be updated based on patient status, additional functional criteria and insurance authorization.  Follow Up Recommendations  Acute inpatient rehab (3hours/day)     Assistance Recommended at Discharge Frequent or constant Supervision/Assistance  Equipment Recommendations  None recommended by PT    Recommendations for Other Services       Precautions / Restrictions Precautions Precautions: Fall Precaution Comments: R hemiplegia and aphasia Restrictions Weight Bearing Restrictions: No     Mobility  Bed Mobility Overal bed mobility: Needs Assistance Bed Mobility: Supine to Sit     Supine to sit: Mod assist;HOB elevated     General bed mobility comments: assist for RLE and trunk management    Transfers Overall transfer level: Needs  assistance Equipment used: Hemi-walker Transfers: Sit to/from Stand;Bed to chair/wheelchair/BSC Sit to Stand: Min assist;Mod assist     Step pivot transfers: Mod assist     General transfer comment: hemi walker used for transfer.  +2 provided to safely assist with sit to stand but patient was able to stand with min/mod assist from bed and chair today. max cues for safe hand placement with poor carryover.    Ambulation/Gait Ambulation/Gait assistance: Mod assist;+2 safety/equipment;+2 physical assistance Gait Distance (Feet): 10 Feet Assistive device: Hemi-walker Gait Pattern/deviations: Step-to pattern;Narrow base of support;Trunk flexed;Decreased dorsiflexion - right;Decreased dorsiflexion - left       General Gait Details: ~46ft pivotal steps to recliner, seated break, then ~9ft forward in room with close chair follow, initially min/modA +1 but pt needed +2 for safety with fatigue, needs assist to manage HW safely and for safe step sequencing, pt with ataxic R steps and decreased step length on LLE, pt fatigued but seemingly unaware of fatigue until instructed to sit. Per tele monitor HR ~140 bpm during gait trial and 100-120 bpm resting in chair   Stairs             Wheelchair Mobility    Modified Rankin (Stroke Patients Only) Modified Rankin (Stroke Patients Only) Pre-Morbid Rankin Score: Moderately severe disability Modified Rankin: Moderately severe disability     Balance Overall balance assessment: Needs assistance Sitting-balance support: No upper extremity supported;Feet supported Sitting balance-Leahy Scale: Fair Sitting balance - Comments: able to assist with donning footwear while sitting on EOB   Standing balance support: Single extremity supported Standing balance-Leahy Scale: Poor Standing balance comment: able to stand at sink for  grooming with min assist for balance with sink providing support but needs +1-2 external support for gait tasks                             Cognition Arousal/Alertness: Awake/alert Behavior During Therapy: WFL for tasks assessed/performed Overall Cognitive Status: Difficult to assess Area of Impairment: Following commands                       Following Commands: Follows one step commands with increased time     Problem Solving: Slow processing;Decreased initiation;Requires verbal cues;Requires tactile cues General Comments: aphasic but able to respond to yes/no questions, able to respond to some other questions although very dysarthric and difficult to understand, not oriented to day of week. She seemed to recall this PTA from previous day's session and was eager to get OOB today.        Exercises Other Exercises Other Exercises: seated BLE AROM: LAQ, hip flexion x10 reps ea    General Comments General comments (skin integrity, edema, etc.): HR tachy (see gait section), pt otherwise denies dizziness. Pt initially reports no fatigue but after walking nods head yes to fatigue, but was still able to stand again at sink to wash her face.      Pertinent Vitals/Pain Pain Assessment: No/denies pain Faces Pain Scale: No hurt Pain Intervention(s): Monitored during session;Repositioned    Home Living                          Prior Function            PT Goals (current goals can now be found in the care plan section) Acute Rehab PT Goals Patient Stated Goal: to return to prior level of function PT Goal Formulation: With family Time For Goal Achievement: 10/05/21 Progress towards PT goals: Progressing toward goals    Frequency    Min 4X/week      PT Plan Current plan remains appropriate    Co-evaluation PT/OT/SLP Co-Evaluation/Treatment: Yes Reason for Co-Treatment: For patient/therapist safety;To address functional/ADL transfers PT goals addressed during session: Mobility/safety with mobility;Balance;Proper use of DME;Strengthening/ROM OT goals addressed  during session: ADL's and self-care      AM-PAC PT "6 Clicks" Mobility   Outcome Measure  Help needed turning from your back to your side while in a flat bed without using bedrails?: A Little Help needed moving from lying on your back to sitting on the side of a flat bed without using bedrails?: A Lot Help needed moving to and from a bed to a chair (including a wheelchair)?: A Lot Help needed standing up from a chair using your arms (e.g., wheelchair or bedside chair)?: A Lot Help needed to walk in hospital room?: A Lot Help needed climbing 3-5 steps with a railing? : Total 6 Click Score: 12    End of Session Equipment Utilized During Treatment: Gait belt Activity Tolerance: Patient tolerated treatment well Patient left: in chair;with call bell/phone within reach;with chair alarm set (pt requesting to keep feet down on floor.) Nurse Communication: Mobility status PT Visit Diagnosis: Other abnormalities of gait and mobility (R26.89);Muscle weakness (generalized) (M62.81);Other symptoms and signs involving the nervous system (R29.898);Hemiplegia and hemiparesis Hemiplegia - Right/Left: Right Hemiplegia - dominant/non-dominant: Dominant Hemiplegia - caused by: Cerebral infarction     Time: [id number] PT Time Calculation (min) (ACUTE ONLY): 28 min  Charges:  $Gait Training: 8-22 mins  Florina Ou., PTA Acute Rehabilitation Services Pager: [phone] Office: [phone]    Angus Palms 09/24/2021, 12:36 PM

## 2021-09-24 NOTE — Progress Notes (Signed)
Report called to RN on 4W for transfer to Rehab.

## 2021-09-24 NOTE — Progress Notes (Signed)
STROKE TEAM PROGRESS NOTE   INTERVAL HISTORY Doing well today. No issues overnight. No new weakness. Echo done and normal. Ready for rehab today. CBC:  Recent Labs  Lab 09/24/21 0203 09/24/21 1234  WBC 4.4 5.0  HGB 11.2* 11.3*  HCT 33.5* 33.2*  MCV 85.2 86.0  PLT 413* 433*   Basic Metabolic Panel:  Recent Labs  Lab 09/23/21 0233 09/24/21 0203  NA 134* 135  K 3.9 3.5  CL 99 103  CO2 21* 24  GLUCOSE 135* 112*  BUN 8 9  CREATININE 0.79 0.79  CALCIUM 9.5 9.0   Lipid Panel:  Recent Labs  Lab 09/23/21 0233  CHOL 113  TRIG 49  HDL 42  CHOLHDL 2.7  VLDL 10  LDLCALC 61   HgbA1c:  Recent Labs  Lab 09/23/21 0233  HGBA1C 5.7*   Urine Drug Screen: No results for input(s): LABOPIA, COCAINSCRNUR, LABBENZ, AMPHETMU, THCU, LABBARB in the last 168 hours.  Alcohol Level No results for input(s): ETH in the last 168 hours.  IMAGING past 24 hours DG Abd 1 View  Result Date: 09/24/2021 CLINICAL DATA:  Abdominal pain, vomiting EXAM: ABDOMEN - 1 VIEW COMPARISON:  None. FINDINGS: The bowel gas pattern is normal. No radio-opaque calculi or other significant radiographic abnormality are seen. IMPRESSION: Nonobstructive pattern of bowel gas. No obvious free air in the abdomen on single supine radiograph. Electronically Signed   By: Jearld Lesch M.D.   On: 09/24/2021 14:20    PHYSICAL EXAM NAD HEENT: Head is nontraumatic. Neck is supple   Neurological Exam :  She is awake alert globally aphasic.    She blinks to threat more on the left than the right.  Extraocular movements appear full range without nystagmus.  Right lower facial weakness.   Motor:  shows right hemiparesis with grade 1/5 strength on the right side and good strength antigravity on the left.   Sensory: Sensation appears slightly diminished on the right compared to left.   Gait not tested. ASSESSMENT/PLAN [name] is a 65 y.o. female with history of left MCA stroke with right-sided deficits and aphasia,  diverticulosis, atrial fibrillation, and hypertension presenting with nausea, vomiting, malaise who was initially admitted for multifocal pyelonephritis. Initial CTH negative for stroke. However, patient still with worsened aphasia and chronic right arm hemiplegia after resolution of other neuro deficits. MRI-brain obtained showing acute left AICA cerebellar infarct without hemorrhagic transformation as well as small left PICA infarcts, indicative of embolic strokes and neurology was consulted for further evaluation.  Stroke:  left cerebellar infarct likely secondary to occlusion of L SCA  CT head -hypodense region in the left cerebellar hemisphere.  Stable old infarct in the left MCA territory CTA head & neck expected evolution of the acute to early subacute infarct in the left SCA distribution.  No evidence of malignant hemorrhagic transformation.  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 LDL 61 HgbA1c 5.7 VTE prophylaxis -Eliquis Eliquis (apixaban) daily prior to admission, now on aspirin 81 mg daily and Eliquis (apixaban) daily.  Therapy recommendations: rehab. Disposition: rehab today.  Possible Seizure Activity Possible seizure activity overnight 12/22 Keppra 1500 mg loading dose then 500 twice daily EEG ordered results pending  Hypertension Home meds: Amlodipine 5 mg restarted IV Lopressor 5 mg every 6 hours Stable Permissive hypertension (OK if < 220/120) but gradually normalize in 5-7 days  Long-term BP goal normotensive  Atrial fibrillation Eliquis 5 mg twice daily Cardiac monitoring  Hyperlipidemia Home meds:  Atorvastatin 20mg , resumed in hospital LDL 61, goal < 70  Continue statin at discharge  Other Stroke Risk Factors Advanced Age >/= 33  Hx stroke/TIA Left MCA infarct 2021 Left PICA cerebellar  infarcts Completed therapy outpatient, was walking with a cane prior to this admission Residual right hemiplegia and aphasia Coronary artery disease Migraines Obstructive sleep apnea, on CPAP at home Congestive heart failure  Other Active Problems Pyelonephritis-Klebsiella Cefazolin 2 g every 8 hours-primary team managing ID consulted  Hospital day # 5  CVA with aphasia and hemiplegia. H/o multifocal pyelonephritis and MRI shows left superior cerebral artery embolic infarct.  Patient has A. fib and is already on anticoagulation with Eliquis 5 mg twice daily.   D/C to rehab today, they agreed to follow blood cx. If positive, then will need TEE. Otherwise plan as above.   F/u in stroke clinic after rehab discharge in 3-4 weeks.  09/24/2021 4:00 PM  To contact Stroke Continuity provider, please refer to [email or web address] After hours, contact General Neurology

## 2021-09-25 ENCOUNTER — Inpatient Hospital Stay (HOSPITAL_COMMUNITY): Payer: Medicare Other

## 2021-09-25 DIAGNOSIS — A499 Bacterial infection, unspecified: Secondary | ICD-10-CM

## 2021-09-25 DIAGNOSIS — K3184 Gastroparesis: Secondary | ICD-10-CM

## 2021-09-25 DIAGNOSIS — N390 Urinary tract infection, site not specified: Secondary | ICD-10-CM

## 2021-09-25 DIAGNOSIS — I482 Chronic atrial fibrillation, unspecified: Secondary | ICD-10-CM

## 2021-09-25 LAB — CBC
HCT: 33.5 % — ABNORMAL LOW (ref 36.0–46.0)
Hemoglobin: 11.8 g/dL — ABNORMAL LOW (ref 12.0–15.0)
MCH: 29.5 pg (ref 26.0–34.0)
MCHC: 35.2 g/dL (ref 30.0–36.0)
MCV: 83.8 fL (ref 80.0–100.0)
Platelets: 419 10*3/uL — ABNORMAL HIGH (ref 150–400)
RBC: 4 MIL/uL (ref 3.87–5.11)
RDW: 14.6 % (ref 11.5–15.5)
WBC: 26.6 10*3/uL — ABNORMAL HIGH (ref 4.0–10.5)
nRBC: 0 % (ref 0.0–0.2)

## 2021-09-25 LAB — BASIC METABOLIC PANEL
Anion gap: 14 (ref 5–15)
BUN: 15 mg/dL (ref 8–23)
CO2: 21 mmol/L — ABNORMAL LOW (ref 22–32)
Calcium: 9.6 mg/dL (ref 8.9–10.3)
Chloride: 97 mmol/L — ABNORMAL LOW (ref 98–111)
Creatinine, Ser: 1.61 mg/dL — ABNORMAL HIGH (ref 0.44–1.00)
GFR, Estimated: 35 mL/min — ABNORMAL LOW (ref >60)
Glucose, Bld: 167 mg/dL — ABNORMAL HIGH (ref 70–99)
Potassium: 4.1 mmol/L (ref 3.5–5.1)
Sodium: 132 mmol/L — ABNORMAL LOW (ref 135–145)

## 2021-09-25 IMAGING — DX DG CHEST 1V PORT
1 series · 1 of 1 positions shown · non-contrast
Comparison: Chest radiograph dated [DATE].

CLINICAL DATA: Shortness of breath.

EXAM:
PORTABLE CHEST 1 VIEW

[chest]
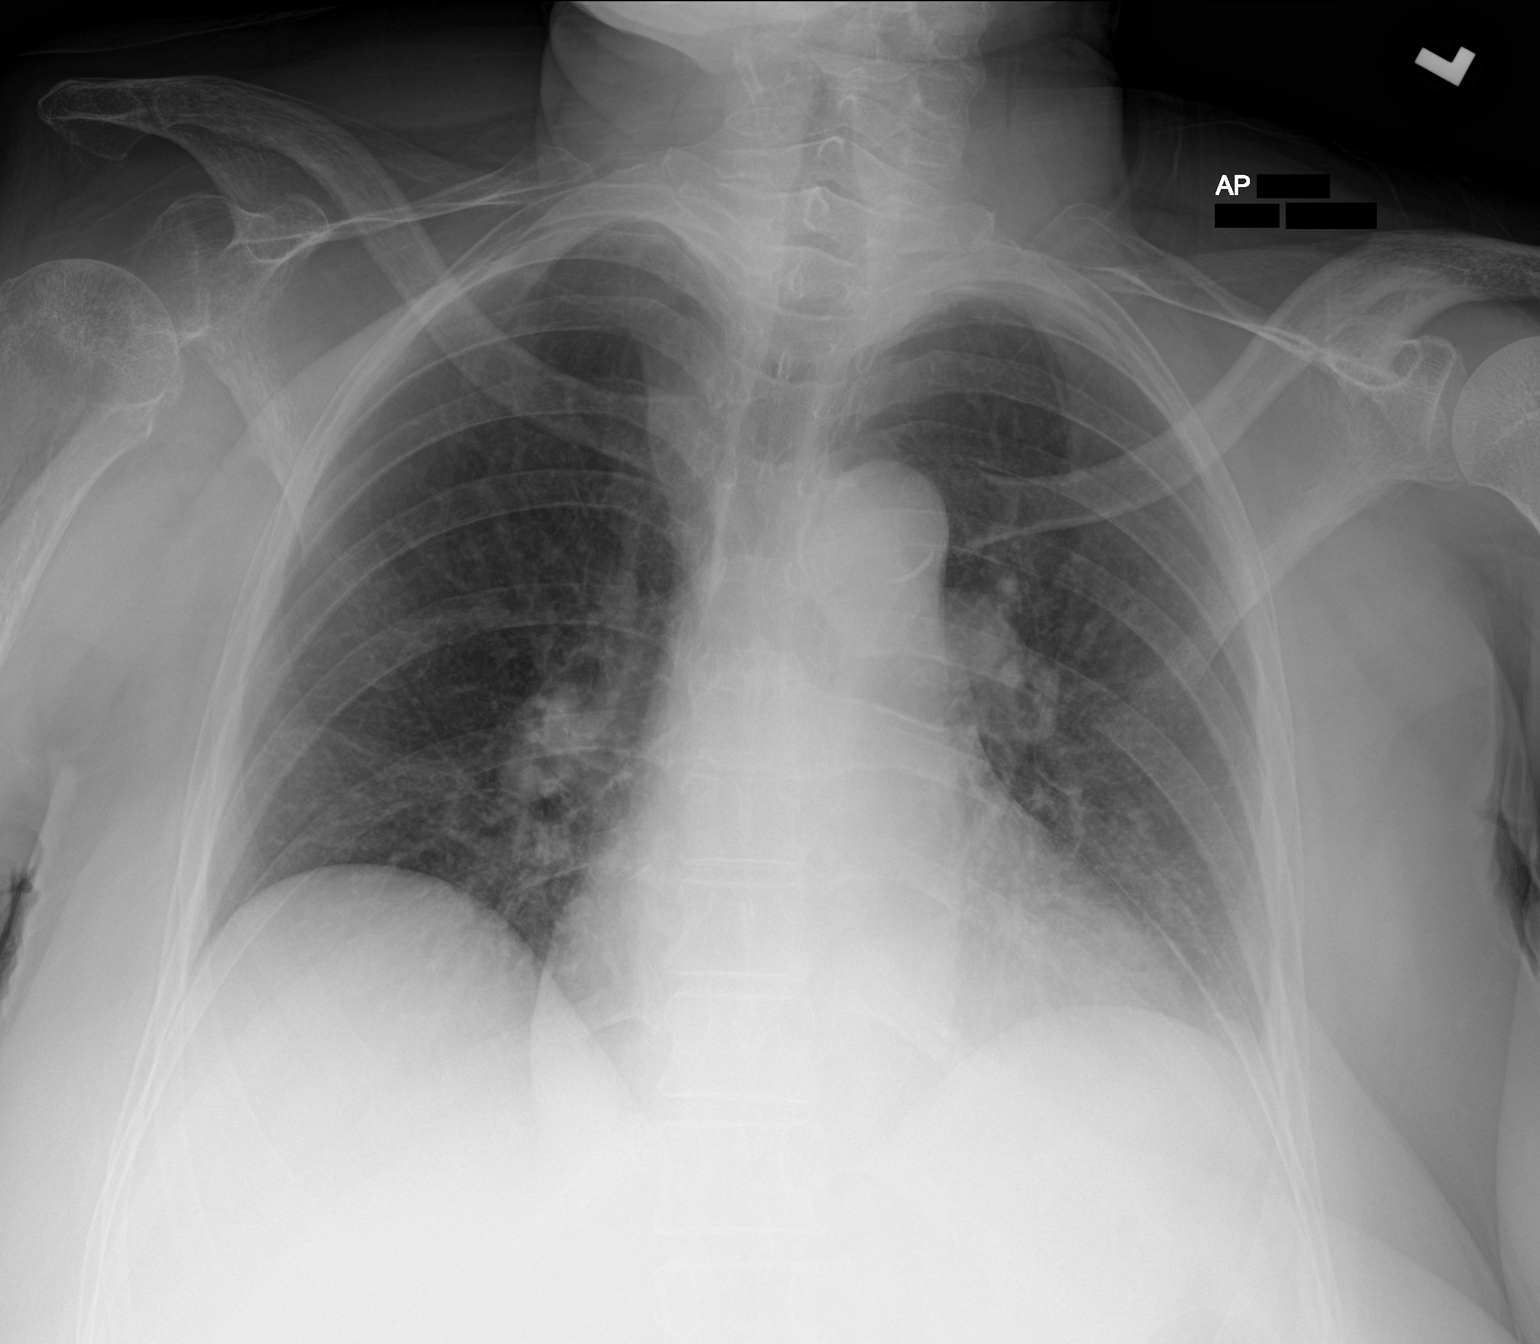

[1 of 1 positions shown; findings below may reference images not displayed]

FINDINGS: Left lung base reticular densities, likely atelectasis. Developing
infiltrate is less likely. No focal consolidation, pleural effusion,
pneumothorax. Mild cardiomegaly. Atherosclerotic calcification of
the aorta. No acute osseous pathology.
IMPRESSION: Left lung base atelectasis. Developing infiltrate is less likely.

## 2021-09-25 MED ORDER — VANCOMYCIN HCL 1500 MG/300ML IV SOLN
1500.0000 mg | Freq: Once | INTRAVENOUS | Status: AC
  Administered 2021-09-26: 1500 mg via INTRAVENOUS
  Filled 2021-09-25 (×2): qty 300

## 2021-09-25 MED ORDER — METOCLOPRAMIDE HCL 5 MG/ML IJ SOLN
5.0000 mg | Freq: Four times a day (QID) | INTRAMUSCULAR | Status: DC
  Administered 2021-09-25 – 2021-09-26 (×2): 5 mg via INTRAVENOUS
  Filled 2021-09-25 (×2): qty 2

## 2021-09-25 MED ORDER — VANCOMYCIN VARIABLE DOSE PER UNSTABLE RENAL FUNCTION (PHARMACIST DOSING)
Status: DC
Start: 2021-09-25 — End: 2021-09-26

## 2021-09-25 MED ORDER — LEVETIRACETAM IN NACL 500 MG/100ML IV SOLN
500.0000 mg | Freq: Two times a day (BID) | INTRAVENOUS | Status: DC
  Filled 2021-09-25 (×4): qty 100

## 2021-09-25 MED ORDER — ACETAMINOPHEN 650 MG RE SUPP
650.0000 mg | Freq: Three times a day (TID) | RECTAL | Status: DC | PRN
  Administered 2021-09-25 – 2021-09-26 (×2): 650 mg via RECTAL
  Filled 2021-09-25 (×2): qty 1

## 2021-09-25 MED ORDER — SODIUM CHLORIDE 0.9 % IV SOLN: INTRAVENOUS | Status: DC

## 2021-09-25 MED ORDER — PIPERACILLIN-TAZOBACTAM 3.375 G IVPB
3.3750 g | Freq: Three times a day (TID) | INTRAVENOUS | Status: DC
  Administered 2021-09-25 – 2021-09-26 (×2): 3.375 g via INTRAVENOUS
  Filled 2021-09-25 (×6): qty 50

## 2021-09-25 MED ORDER — METOCLOPRAMIDE HCL 5 MG PO TABS
5.0000 mg | ORAL_TABLET | Freq: Three times a day (TID) | ORAL | Status: DC
  Administered 2021-09-25 (×2): 5 mg via ORAL
  Filled 2021-09-25 (×2): qty 1

## 2021-09-25 MED ORDER — LEVETIRACETAM 100 MG/ML PO SOLN
500.0000 mg | Freq: Two times a day (BID) | ORAL | Status: DC
  Administered 2021-09-25 – 2021-09-26 (×3): 500 mg via ORAL
  Filled 2021-09-25 (×3): qty 5

## 2021-09-25 NOTE — IPOC Note (Signed)
Overall Plan of Care [facility]) Patient Details Name: Lindsey [name] MRN: [id number] DOB: Feb 22, 1956  Admitting Diagnosis: 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 [facility])  Hospital Problems: Principal Problem:   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 [facility])     Functional Problem List: Nursing Bladder, Bowel, Endurance, Medication Management, Safety, Sensory  PT Balance, Perception, Behavior, Safety, Sensory, Edema, Endurance, Skin Integrity, Motor, Nutrition, Pain  OT Balance, Cognition, Motor, Endurance, Safety, Sensory, Perception  SLP Cognition, Linguistic, Nutrition  TR         Basic ADLs: OT Eating, Grooming, Bathing, Dressing, Toileting     Advanced  ADLs: OT       Transfers: PT Bed Mobility, Bed to Chair, Teacher, early years/pre, Tub/Shower     Locomotion: PT Ambulation, Emergency planning/management officer, Stairs     Additional Impairments: OT Fuctional Use of Upper Extremity  SLP Swallowing, Communication, Social Cognition comprehension, expression Attention, Awareness, Social Interaction  TR      Anticipated Outcomes Item Anticipated Outcome  Self Feeding Supervision  Swallowing  Supervision   Basic self-care  Min assist  Toileting  Min assist   Bathroom Transfers Min assist  Bowel/Bladder  min assist  Transfers  min A using LRAD  Locomotion  Min A using LRAD 50 ft  Communication  Mod A  Cognition  Min A  Pain  n/a  Safety/Judgment  min assist and no falls   Therapy Plan: PT Intensity: Minimum of 1-2 x/day ,45 to 90 minutes PT Frequency: 5 out of 7 days PT Duration Estimated Length of Stay: 10-14 days OT Intensity: Minimum of 1-2 x/day, 45 to 90 minutes OT Frequency: 5 out of 7 days OT Duration/Estimated Length of Stay: 18-21 days SLP Intensity: Minumum of 1-2 x/day, 30 to 90 minutes SLP Frequency: 3 to 5 out of 7 days SLP Duration/Estimated Length of Stay: 10-14 days    Due to the current state of emergency, patients may not be receiving their 3-hours of Medicare-mandated therapy.   Team Interventions: Nursing Interventions Patient/Family Education, Bladder Management, Bowel Management, Disease Management/Prevention, Medication Management, Discharge Planning  PT interventions Ambulation/gait training, Cognitive remediation/compensation, Discharge planning, DME/adaptive equipment instruction, Functional mobility training, Pain management, Psychosocial support, Splinting/orthotics, Therapeutic Activities, UE/LE Strength taining/ROM, Visual/perceptual remediation/compensation, Wheelchair propulsion/positioning, UE/LE Coordination activities, Therapeutic Exercise, Skin care/wound management, Patient/family education, Neuromuscular re-education, Functional electrical stimulation, Disease management/prevention, Academic librarian, Training and development officer  OT Interventions Training and development officer, Functional electrical stimulation, Pain management, Self Care/advanced ADL retraining, Therapeutic Activities, UE/LE Coordination activities, DME/adaptive equipment instruction, UE/LE Strength taining/ROM, Neuromuscular re-education, Wheelchair propulsion/positioning, Cognitive remediation/compensation, Discharge planning, Disease mangement/prevention, Patient/family education, Functional mobility training, Therapeutic Exercise, Visual/perceptual remediation/compensation  SLP Interventions Cognitive remediation/compensation, Dysphagia/aspiration precaution training, Internal/external aids, Speech/Language facilitation, Cueing hierarchy, Environmental controls, Therapeutic Activities, Functional tasks, Patient/family education  TR Interventions    SW/CM Interventions     Barriers to Discharge MD  Medical stability  Nursing Decreased caregiver support, Home environment access/layout, Incontinence, Lack of/limited family support, Weight, Medication compliance Lives in  2 level home with ramped entrance. Full flight of stairs to 2nd level, stair lift available. Sleep downstairs, bathes upstairs, 1/2 bath on main level. Lives with daughter who was providing care PTA.  PT Other (comments), Decreased caregiver support increased burden of care, decreased level of function  OT      SLP      SW       Team Discharge Planning: Destination: PT-Home ,OT- Home , SLP-Home  Projected Follow-up: PT-Home health PT, OT-  Outpatient OT, 24 hour supervision/assistance, SLP-Outpatient SLP, 24 hour supervision/assistance, Home Health SLP Projected Equipment Needs: PT-To be determined, OT- To be determined, SLP-None recommended by SLP Equipment Details: PT- , OT-  Patient/family involved in discharge planning: PT- Patient unable/family or caregiver not available,  OT-Patient, Family member/caregiver, SLP-Patient unable/family or caregive not available  MD ELOS: 10-14 days Medical Rehab Prognosis:  Excellent Assessment: The patient has been admitted for CIR therapies with the diagnosis of left cerebellar artery infarct. The team will be addressing functional mobility, strength, stamina, balance, safety, adaptive techniques and equipment, self-care, bowel and bladder mgt, patient and caregiver education, NMR, cognition, swallowing, community reentry. Goals have been set at min assist for mobility and self-care and min -mod assist with communication and language.   Due to the current state of emergency, patients may not be receiving their 3 hours per day of Medicare-mandated therapy.    Ranelle Oyster, MD, FAAPMR     See Team Conference Notes for weekly updates to the plan of care

## 2021-09-25 NOTE — Progress Notes (Addendum)
Contacted by RN re: temp of 100.6, tachycardia and mild tachypnea. Pt has had intermittent vomiting with concerns of gastroparesis. More somnolent. CXR reviewed and reveals questionable LLL infiltrate and WBC's are now 26k  Assessment: Sepsis/aspiration pneumonia Hx of pyelo/UTI   Plan: Empiric vanc and zosyn Repeat ucx IV NS Repeat cbc and bmet in the AM Current blood cx are pending day 2 Repeat blood cx if spikes Supportive care  Ranelle Oyster, MD, [facility] Physical Medicine & Rehabilitation Medical Director Rehabilitation Services 09/25/2021

## 2021-09-25 NOTE — Progress Notes (Signed)
Pharmacy Antibiotic Note  Lindsey [name] is a 65 y.o. female currently in inpatient rehab for functional deficits 2/2 left anterior cerebellar artery infarct. MD was contacted by RN 12/24 PM stating pt had Tm 101.6, WBC 26k, more somnolent. CXR revealed possible LLL infiltrate with concern for aspiration pneumonia. Patient also has a history of UTI. Pharmacy has been consulted for vancomycin/zosyn dosing.  12/24: Scr 1.61 (up from 0.79)   Plan: Vancomycin 1500 mg IV x1 (followed by vancomycin variable dosing)  Zosyn 3.75 g IV Q8H  F/U Bcx, Ucx, sensis, renal function, clinical status, de-escalation   Height: 5' 4.17" (163 cm) Weight: 66.3 kg (146 lb 2.6 oz) IBW/kg (Calculated) : 55.1  Temp (24hrs), Avg:99.7 F (37.6 C), Min:98.9 F (37.2 C), Max:100.6 F (38.1 C)  Recent Labs  Lab 09/20/21 0305 09/21/21 0221 09/23/21 0233 09/24/21 0203 09/24/21 1234 09/25/21 2034  WBC 5.7 5.2 5.5 4.4 5.0 26.6*  CREATININE 0.58 0.69 0.79 0.79  --  1.61*    Estimated Creatinine Clearance: 32.8 mL/min (A) (by C-G formula based on SCr of 1.61 mg/dL (H)).    No Known Allergies  Antimicrobials this admission: Ceftriaxone 12/18>>12/21 Cefazolin 12/21>>12/22 Levofloxacin 12/22>>12/24 Vancomycin 12/24>> Zosyn 12/24>>   Microbiology results: 12/22 BCx: NGTD 12/18 UCx: > 100,000 klebsiella pneumoniae    Jani Gravel, PharmD PGY-1 Acute Care Resident  09/25/2021 9:50 PM

## 2021-09-25 NOTE — Progress Notes (Signed)
°   09/25/21 1950  Assess: MEWS Score  Temp (!) 100.6 F (38.1 C)  BP (!) 146/100  Pulse Rate (!) 102  Resp (!) 21  Level of Consciousness Alert (but very drowsy)  SpO2 100 %  O2 Device Room Air  Assess: MEWS Score  MEWS Temp 1  MEWS Systolic 0  MEWS Pulse 1  MEWS RR 1  MEWS LOC 0  MEWS Score 3  MEWS Score Color Yellow  Treat  Pain Scale Faces  Pain Score 0  Take Vital Signs  Increase Vital Sign Frequency  Yellow: Q 2hr X 2 then Q 4hr X 2, if remains yellow, continue Q 4hrs  Escalate  MEWS: Escalate Yellow: discuss with charge nurse/RN and consider discussing with provider and RRT  Notify: Charge Nurse/RN  Name of Charge Nurse/RN Notified Lindsey Benes RN  Date Charge Nurse/RN Notified 09/25/21  Time Charge Nurse/RN Notified 1953  Notify: Provider  Provider Name/Title MD Riley Kill  Date Provider Notified 09/25/21  Time Provider Notified 2001  Notification Type Call  Notification Reason Change in status  Provider response Other (Comment);See new orders (MD will look at chart)  Date of Provider Response 09/25/21  Time of Provider Response 2019

## 2021-09-25 NOTE — Evaluation (Signed)
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 No pertinent surgical history.  Assessment & Plan Clinical Impression: Patient is a 65 y.o. 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  She presented to the emergency department on September 19, 2021 with new onset of nausea, vomiting, diaphoresis and change in baseline mental status as reported by her family.  Negative report of loss of consciousness or seizure type activity.  No known fever or chills.  She lives at home with her daughter who provides care.  History of atrial fibrillation compliant with Eliquis.  As part of her work-up she underwent CT scan of the abdomen pelvis which revealed multi focal pyelonephritis of the right kidney.  Urology, Dr. Jeffie Pollock was contacted who reviewed the films and the patient's data.  He recommended infectious disease consultation.  There was also concern of hyperdensity within the gallbladder body and MRCP was obtained.  Gallbladder was within normal without mass.  She was started on antibiotics intravenously for pyelonephritis.  Her positive for Klebsiella.  She was transitioned to oral Levaquin on 12/19.     A CT of the head was also performed on admission  which was negative, however, due to worsening speech, MRI was performed on 12/21 which revealed Acute moderate size Left AICA cerebellar infarct. .Underlying extensive chronic left MCA territory encephalomalacia related to the 2021 infarct. Associated left brainstem Wallerian degeneration since last year.And small superimposed chronic Left PICA cerebellar infarcts, but also new since last year.  Rapid response was called on 12/22 secondary to elevated heart rate responsiveness and foaming at her mouth.  She was turned to her side and the episode resolved within a minute.  Neurology evaluated and Keppra load of 1500 mg IV was given and she was started on Keppra 500 mg twice a day.  Aspirin 81 mg added.  2D echo was completed and negative. EF = estimated 50 to 55%. The patient requires inpatient medicine and rehabilitation evaluations and services for ongoing dysfunction secondary to stroke. [facility] transferred to [facility] on 09/24/2021 .   Patient currently requires mod with mobility secondary to muscle weakness and muscle joint tightness, decreased cardiorespiratoy endurance, abnormal tone and decreased motor planning, decreased attention to right, decreased initiation, decreased attention, decreased awareness, and decreased problem solving, and decreased sitting balance, decreased standing balance, decreased postural control, hemiplegia, and decreased balance strategies.  Prior to hospitalization, patient was supervision to min A using a SPC, per patient's chart, with mobility and lived with Daughter in a House home.  Home access is  Level entry.  Patient will benefit from skilled PT intervention to maximize safe functional mobility, minimize fall risk, and decrease caregiver burden for planned discharge home with 24 hour assist.  Anticipate patient will benefit from follow up [facility] at discharge.  PT - End of Session Activity  Tolerance: Tolerates 30+ min activity with multiple rests Endurance Deficit:  Yes Endurance Deficit Description: Requires frequent rest breaks with minimal activity PT Assessment Rehab Potential (ACUTE/IP ONLY): Good PT Barriers to Discharge: Other (comments);Decreased caregiver support PT Barriers to Discharge Comments: increased burden of care, decreased level of function PT Patient demonstrates impairments in the following area(s): Balance;Perception;Behavior;Safety;Sensory;Edema;Endurance;Skin Integrity;Motor;Nutrition;Pain PT Transfers Functional Problem(s): Bed Mobility;Bed to Chair;Car PT Locomotion Functional Problem(s): Ambulation;Wheelchair Mobility;Stairs PT Plan PT Intensity: Minimum of 1-2 x/day ,45 to 90 minutes PT Frequency: 5 out of 7 days PT Duration Estimated Length of Stay: 10-14 days PT Treatment/Interventions: Ambulation/gait training;Cognitive remediation/compensation;Discharge planning;DME/adaptive equipment instruction;Functional mobility training;Pain management;Psychosocial support;Splinting/orthotics;Therapeutic Activities;UE/LE Strength taining/ROM;Visual/perceptual remediation/compensation;Wheelchair propulsion/positioning;UE/LE Coordination activities;Therapeutic Exercise;Skin care/wound management;Patient/family education;Neuromuscular re-education;Functional electrical stimulation;Disease management/prevention;Community reintegration;Balance/vestibular training PT Transfers Anticipated Outcome(s): min A using LRAD PT Locomotion Anticipated Outcome(s): Min A using LRAD 50 ft PT Recommendation Follow Up Recommendations: Home health PT Patient destination: Home Equipment Recommended: To be determined   PT Evaluation Precautions/Restrictions Precautions Precautions: Fall Precaution Comments: R hemiplegia and aphasia Required Braces or Orthoses: Other Brace Other Brace: wears R AFO with ambulation at baseline Restrictions Weight Bearing Restrictions: No General   Vital Signs Pain   Pain Interference Pain Interference Pain Effect  on Sleep: 8. Unable to answer Pain Interference with Therapy Activities: 8. Unable to answer Pain Interference with Day-to-Day Activities: 8. Unable to answer Home Living/Prior Functioning Home Living Available Help at Discharge: Family Type of Home: House Home Access: Level entry Home Layout: Multi-level;Able to live on main level with bedroom/bathroom;1/2 bath on main level Alternate Level Stairs-Number of Steps: flight, chair lift Bathroom Shower/Tub: Walk-in shower Bathroom Toilet: Standard Bathroom Accessibility: Yes Additional Comments: Information taken from chart due to patient presenting with expressive aphasia and no family present at time of evaluation  Lives With: Daughter Prior Function Level of Independence: Requires assistive device for independence;Needs assistance with ADLs;Needs assistance with gait Driving: No Vision/Perception  Vision - History Ability to See in Adequate Light: 0 Adequate (with glasses) Vision - Assessment Additional Comments: difficult to formally assess due to cognitive and communication deficits Perception Perception: Impaired Inattention/Neglect: Does not attend to right side of body Praxis Praxis: Impaired Praxis Impairment Details: Motor planning  Cognition Overall Cognitive Status: History of cognitive impairments - at baseline Arousal/Alertness: Awake/alert Orientation Level: Oriented to person Attention: Focused;Sustained Focused Attention: Appears intact Sustained Attention: Impaired Sustained Attention Impairment: Verbal basic;Functional basic Problem Solving: Impaired Problem Solving Impairment: Functional basic Safety/Judgment: Appears intact Sensation Sensation Light Touch: Impaired Detail Central sensation comments: R hemiperisis, non-responsive to light touch in UE and LE, responsive to noxious stimulus in LE nonresponsive to noxious stimulus in UE Light Touch Impaired Details: Absent RUE;Impaired RLE Proprioception:  Impaired by gross assessment (R LE) Coordination Gross Motor Movements are Fluid and Coordinated: No Fine Motor Movements are Fluid and Coordinated: No Coordination and Movement Description: R hemi UE>LE, decreased postural control and motor planning Heel Shin Test: Unable on R Motor  Motor Motor: Hemiplegia;Abnormal postural alignment and control;Abnormal tone   Trunk/Postural Assessment  Cervical Assessment Cervical Assessment: Exceptions to [facility] (forward head) Thoracic Assessment Thoracic Assessment: Exceptions to [facility] (kyphosis) Lumbar Assessment Lumbar Assessment: Exceptions to [facility] (sacral sitting) Postural Control Postural Control: Deficits on evaluation (decreased/delayed)  Balance Balance Balance Assessed: Yes Standardized Balance Assessment Standardized Balance Assessment: Berg Balance Test Berg Balance Test Sit to Stand: Needs moderate or maximal assist to stand Standing Unsupported: Unable to stand 30 seconds unassisted Sitting with Back Unsupported but Feet Supported on  Floor or Stool: Able to sit safely and securely 2 minutes Stand to Sit: Needs assistance to sit Transfers: Needs one person to assist Standing Unsupported with Eyes Closed: Needs help to keep from falling Standing Ubsupported with Feet Together: Needs help to attain position and unable to hold for 15 seconds From Standing, Reach Forward with Outstretched Arm: Loses balance while trying/requires external support From Standing Position, Pick up Object from Floor: Unable to try/needs assist to keep balance From Standing Position, Turn to Look Behind Over each Shoulder: Needs assist to keep from losing balance and falling Turn 360 Degrees: Needs assistance while turning Standing Unsupported, Alternately Place Feet on Step/Stool: Needs assistance to keep from falling or unable to try Standing Unsupported, One Foot in Front: Loses balance while stepping or standing Standing on One Leg: Unable to try or needs  assist to prevent fall Total Score: 5 Static Sitting Balance Static Sitting - Balance Support: No upper extremity supported;Feet supported Static Sitting - Level of Assistance: 5: Stand by assistance Dynamic Sitting Balance Dynamic Sitting - Balance Support: During functional activity;Feet supported Dynamic Sitting - Level of Assistance: 5: Stand by assistance Static Standing Balance Static Standing - Balance Support: During functional activity;No upper extremity supported Static Standing - Level of Assistance: 3: Mod assist Dynamic Standing Balance Dynamic Standing - Balance Support: During functional activity;No upper extremity supported Dynamic Standing - Level of Assistance: 3: Mod assist Extremity Assessment      RLE Assessment RLE Assessment: Exceptions to [facility] Active Range of Motion (AROM) Comments: DF ROM to limited at neutral General Strength Comments: Grossly 2 to 3/5 throughout, unable to motor plan for formal strength testing LLE Assessment LLE Assessment: Within Functional Limits Active Range of Motion (AROM) Comments: WFL for all functional mobility General Strength Comments: Grossly 4+ to 5/5 throughout  Care Tool Care Tool Bed Mobility Roll left and right activity   Roll left and right assist level: Moderate Assistance - Patient 50 - 74%    Sit to lying activity   Sit to lying assist level: Moderate Assistance - Patient 50 - 74%    Lying to sitting on side of bed activity   Lying to sitting on side of bed assist level: the ability to move from lying on the back to sitting on the side of the bed with no back support.: Moderate Assistance - Patient 50 - 74%     Care Tool Transfers Sit to stand transfer   Sit to stand assist level: Moderate Assistance - Patient 50 - 74%    Chair/bed transfer   Chair/bed transfer assist level: Moderate Assistance - Patient 50 - 74%     Physiological scientist transfer assist level: Moderate Assistance -  Patient 50 - 74%      Care Tool Locomotion Ambulation   Assist level: Moderate Assistance - Patient 50 - 74% Assistive device: Walker-rolling (R hand splint, R AFO) Max distance: 36 ft  Walk 10 feet activity   Assist level: Moderate Assistance - Patient - 50 - 74% Assistive device: Walker-rolling   Walk 50 feet with 2 turns activity Walk 50 feet with 2 turns activity did not occur: Safety/medical concerns      Walk 150 feet activity Walk 150 feet activity did not occur: Safety/medical concerns      Walk 10 feet on uneven surfaces activity Walk 10 feet on uneven surfaces activity did not occur: Safety/medical concerns  Stairs Stair activity did not occur: N/A (patient does not perform stairs at baseline)        Walk up/down 1 step activity Walk up/down 1 step or curb (drop down) activity did not occur: N/A      Walk up/down 4 steps activity Walk up/down 4 steps activity did not occur: N/A      Walk up/down 12 steps activity Walk up/down 12 steps activity did not occur: N/A      Pick up small objects from floor Pick up small object from the floor (from standing position) activity did not occur: Safety/medical concerns      Wheelchair Is the patient using a wheelchair?: Yes Type of Wheelchair: Manual   Wheelchair assist level: Dependent - Patient 0% Max wheelchair distance: >150 ft  Wheel 50 feet with 2 turns activity   Assist Level: Dependent - Patient 0%  Wheel 150 feet activity   Assist Level: Dependent - Patient 0%    Refer to Care Plan for Long Term Goals  SHORT TERM GOAL WEEK 1 PT Short Term Goal 1 (Week 1): Patient will perform bed mobility with min A >50% of the time without use of hospital bed features. PT Short Term Goal 2 (Week 1): Patient will perform basic transfers with min A >50% of the time using LRAD. PT Short Term Goal 3 (Week 1): Patient will ambulate >50 feet with min A using LRAD. PT Short Term Goal 4 (Week 1): Patient will improved Berg  Balance Scale by 7 points to meed MCD.  Recommendations for other services: None   Skilled Therapeutic Intervention Evaluation completed (see details above and below) with education on PT POC and goals and individual treatment initiated with focus on functional mobility/transfers, LE strength, dynamic standing balance/coordination, ambulation, stair navigation, simulated car transfers, and improved endurance with activity Patient provided with 20"x18" wheelchair with standard foam cushion and adjustments made to promote optimal seating posture and pressure distribution. Patient also provided with RW with R hand splint for use in room and therapist adjusted to proper height for patient.  Patient in bed upon PT arrival. Patient alert and agreeable to PT session. Patient denied pain during session. Patient mostly non-verbal with minimal use of head nods for yes/no. Did verbalize single words x3 during session. Appears to follow simple one-step commands consistently.   Therapeutic Activity: Bed Mobility: Patient performed rolling R/L with mod A and total A for donning paper scrub pants. She performed supine to/from sit with mod A for trunk and R lower extremity management. All bed mobility performed with hospital bed features as the patient has a hospital bed at home, per the chart.  Transfers: Patient performed sit to/from stand x1 without AD with mod A and x2 using RW with R hand splint and R AFO with min A for forward weight shift and total A for R hand placement. She performed stand pivot without AD with mod A with PT blocking R knee and using RW with R hand splint and AFO with mod-min A for weight shifting and R foot management due to decreased motor planning. Provided verbal cues for sequencing, hand placement, forward weight shift to stand, lateral weight shift for stepping, and reaching back to control descent. Patient performed a simulated sedan height car transfer with mod A without an AD.  Provided cues for safe technique.  Gait Training:  Patient ambulated 36 feet with 2x180 deg turns using RW with R hand splint and R personal AFO with min progressing to  mod A. Ambulated with decreased R knee/hip flexion, decreased R weight shift, decreased R foot clearance, decreased R grip in hand splint, and progress to max A for R foot placement with fatigue. Provided max multimodal cues for erect posture, sequencing, weight shifting, R foot clearance, and initiation of R stepping.  Wheelchair Mobility:  Patient attempted hemi-technique with poor recall from previous stay at [facility] and decreased motor planning with cues, shook head no when asked if she propels the w/c herself at home.   Instructed pt in results of PT evaluation as detailed above, PT POC, rehab potential, rehab goals, and discharge recommendations. Additionally discussed [facility]'s policies regarding fall safety and use of chair alarm and/or quick release belt. Pt verbalized understanding and in agreement. Will update pt's family members as they become available.   Patient in w/c with R hand on R lap tray at end of session with breaks locked, seat blet alarm set, and all needs within reach.   Discharge Criteria: Patient will be discharged from PT if patient refuses treatment 3 consecutive times without medical reason, if treatment goals not met, if there is a change in medical status, if patient makes no progress towards goals or if patient is discharged from hospital.  The above assessment, treatment plan, treatment alternatives and goals were discussed and mutually agreed upon: No family available/patient unable  Doreene Burke PT, DPT  09/25/2021, 12:33 PM

## 2021-09-25 NOTE — Progress Notes (Signed)
Patient's daughter requesting provider to call her regarding medications. She also had questions about her kidney infections. Daughter is [name] [phone]

## 2021-09-25 NOTE — Evaluation (Signed)
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 No pertinent surgical history.  Assessment & Plan Clinical Impression: Patient is a 65 y.o. year old female with recent admission to the hospital on September 19, 2021 with new onset of nausea, vomiting, diaphoresis and change in baseline mental status as reported by her family.  Negative report of loss of consciousness or seizure type activity.  No known fever or chills.  She lives at home with her daughter who provides care.  History of atrial fibrillation compliant with Eliquis.  As part of her work-up she underwent CT scan of the abdomen pelvis which revealed multi focal pyelonephritis of the right kidney.  Urology, Dr. Jeffie Pollock was contacted who reviewed the films and the patient's data.  He recommended infectious disease consultation.  There was also concern of hyperdensity within the gallbladder body and MRCP was obtained.  Gallbladder was within normal without mass.  She was started on antibiotics intravenously for pyelonephritis.  Her positive for Klebsiella.  She was transitioned to oral Levaquin on 12/19.  A CT of the head was also performed on admission which was negative, however, due to worsening speech, MRI was performed on 12/21 which revealed Acute moderate size Left  AICA cerebellar infarct. .Underlying extensive chronic left MCA territory encephalomalacia related to the 2021 infarct.  Patient transferred to CIR on 09/24/2021 .    Patient currently requires max with basic self-care skills secondary to muscle weakness, muscle joint tightness, and muscle paralysis, impaired timing and sequencing, abnormal tone, unbalanced muscle activation, motor apraxia, decreased coordination, and decreased motor planning, decreased attention to right and decreased motor planning, decreased initiation, decreased attention, decreased awareness, decreased problem solving, decreased safety awareness, decreased memory, and delayed processing, and decreased sitting balance, decreased standing balance, decreased postural control, hemiplegia, and decreased balance strategies.  Prior to hospitalization, patient could complete ADLs with supervision.  Patient will benefit from skilled intervention to decrease level of assist with basic self-care skills and increase independence with basic self-care skills prior to discharge home with care partner.  Anticipate patient will require 24 hour supervision and follow up outpatient.  OT - End of Session Activity Tolerance: Decreased this session Endurance Deficit: Yes Endurance Deficit Description: Requires frequent rest breaks with minimal activity OT Assessment Rehab Potential (ACUTE ONLY): Good OT Patient demonstrates impairments in the following area(s): Balance;Cognition;Motor;Endurance;Safety;Sensory;Perception OT Basic ADL's Functional Problem(s): Eating;Grooming;Bathing;Dressing;Toileting OT Transfers Functional Problem(s): Toilet;Tub/Shower OT Additional Impairment(s): Fuctional Use of Upper Extremity OT Plan OT Intensity: Minimum of 1-2 x/day, 45 to 90 minutes OT Frequency: 5 out of 7 days OT Duration/Estimated Length of Stay: 18-21 days OT Treatment/Interventions: Balance/vestibular training;Functional electrical stimulation;Pain  management;Self Care/advanced ADL retraining;Therapeutic Activities;UE/LE Coordination activities;DME/adaptive equipment instruction;UE/LE Strength taining/ROM;Neuromuscular re-education;Wheelchair propulsion/positioning;Cognitive remediation/compensation;Discharge planning;Disease mangement/prevention;Patient/family education;Functional mobility training;Therapeutic Exercise;Visual/perceptual remediation/compensation OT Self Feeding Anticipated Outcome(s): Supervision OT Basic Self-Care Anticipated Outcome(s): Min assist OT Toileting Anticipated Outcome(s): Min  assist OT Bathroom Transfers Anticipated Outcome(s): Min assist OT Recommendation Patient destination: Home Follow Up Recommendations: Outpatient OT;24 hour supervision/assistance Equipment Recommended: To be determined   OT Evaluation Precautions/Restrictions  Precautions Precautions: Fall Precaution Comments: R hemiplegia and aphasia Required Braces or Orthoses: Other Brace Other Brace: wears R AFO with ambulation at baseline Restrictions Weight Bearing Restrictions: No  Pain Pain Assessment Pain Scale: Faces Pain Score: 0-No pain Home Living/Prior Functioning Home Living Family/patient expects to be discharged to:: Private residence Living Arrangements: Children Available Help at Discharge: Family Type of Home: House Home Access: Level entry Home Layout: Multi-level, Able to live on main level with bedroom/bathroom, 1/2 bath on main level Alternate Level Stairs-Number of Steps: flight, chair lift Bathroom Shower/Tub: Walk-in shower (upstairs) Bathroom Toilet: Associate Professor Accessibility: Yes Additional Comments: Information taken from chart due to patient presenting with expressive aphasia and no family present at time of evaluation  Lives With: Daughter IADL History Homemaking Responsibilities: No Current License: No Prior Function Level of Independence: Requires assistive device for independence, Needs  assistance with ADLs, Needs assistance with gait Bath: Supervision/set-up Toileting: Supervision/set-up Dressing: Supervision/set-up Driving: No Vision Baseline Vision/History: 1 Wears glasses Ability to See in Adequate Light: 0 Adequate Patient Visual Report: No change from baseline Vision Assessment?:  (Pt unable to consistently follow commands for visual testing.  When told to follow therapist's finger she exhibited delays in both directions.) Perception  Perception: Impaired Inattention/Neglect: Does not attend to right side of body Praxis Praxis: Impaired Praxis Impairment Details: Motor planning;Initiation Cognition Overall Cognitive Status: No family/caregiver present to determine baseline cognitive functioning Arousal/Alertness: Awake/alert Orientation Level:  (Unable to determine secondary to expressive  aphasia) Year:  (Unable to determine secondary to expressive  aphasia) Month:  (Unable to determine secondary to expressive  aphasia) Day of Week:  (Unable to determine secondary to expressive  aphasia) Memory: Impaired (Unable to determine secondary to expressive  aphasia) Immediate Memory Recall:  (Unable to determine secondary to expressive  aphasia) Memory Recall Sock:  (Unable to determine secondary to expressive  aphasia) Memory Recall Blue:  (Unable to determine secondary to expressive  aphasia) Memory Recall Bed:  (Unable to determine secondary to expressive  aphasia) Attention: Focused;Sustained Focused Attention: Appears intact Sustained Attention: Impaired Sustained Attention Impairment: Verbal basic;Functional basic Problem Solving: Impaired Problem Solving Impairment: Functional basic Safety/Judgment: Appears intact Sensation Sensation Light Touch: Not tested (difficult to assess secondary to cognition) Central sensation comments: R hemiperisis, non-responsive to light touch in UE and LE, responsive to noxious stimulus in LE nonresponsive to noxious  stimulus in UE Light Touch Impaired Details: Absent RUE;Impaired RLE Proprioception: Impaired by gross assessment (likely impaired in the RUE from previous CVA but unable to accurately assess) Coordination Gross Motor Movements are Fluid and Coordinated: No Fine Motor Movements are Fluid and Coordinated: No Coordination and Movement Description: Pt with history of RUE hemiparesis with increased tone.  Pt with no attempts at functional use during session. Heel Shin Test: Unable on R Motor  Motor Motor: Hemiplegia;Abnormal postural alignment and control;Abnormal tone Motor - Skilled Clinical Observations: Right hemiparesis, with greater impairment in the UE compared to LE  Trunk/Postural Assessment  Cervical Assessment Cervical Assessment: Exceptions to [facility] (cervical flexion with head tilt to the left) Thoracic Assessment Thoracic Assessment: Exceptions to [facility] (thoracic rounding with right shoulder elevation) Lumbar Assessment Lumbar Assessment: Exceptions to [facility] (posterior pelvic tilt) Postural Control Postural Control: Deficits on evaluation (decreased/delayed)  Balance Balance Balance Assessed: Yes Standardized Balance Assessment Standardized Balance Assessment:  Berg Balance Test Berg Balance Test Sit to Stand: Needs moderate or maximal assist to stand Standing Unsupported: Unable to stand 30 seconds unassisted Sitting with Back Unsupported but Feet Supported on Floor or Stool: Able to sit safely and securely 2 minutes Stand to Sit: Needs assistance to sit Transfers: Needs one person to assist Standing Unsupported with Eyes Closed: Needs help to keep from falling Standing Ubsupported with Feet Together: Needs help to attain position and unable to hold for 15 seconds From Standing, Reach Forward with Outstretched Arm: Loses balance while trying/requires external support From Standing Position, Pick up Object from Floor: Unable to try/needs assist to keep balance From Standing  Position, Turn to Look Behind Over each Shoulder: Needs assist to keep from losing balance and falling Turn 360 Degrees: Needs assistance while turning Standing Unsupported, Alternately Place Feet on Step/Stool: Needs assistance to keep from falling or unable to try Standing Unsupported, One Foot in Front: Loses balance while stepping or standing Standing on One Leg: Unable to try or needs assist to prevent fall Total Score: 5 Static Sitting Balance Static Sitting - Balance Support: No upper extremity supported;Feet supported Static Sitting - Level of Assistance: 5: Stand by assistance Dynamic Sitting Balance Dynamic Sitting - Balance Support: During functional activity Dynamic Sitting - Level of Assistance: 4: Min assist Static Standing Balance Static Standing - Balance Support: During functional activity;No upper extremity supported Static Standing - Level of Assistance: 2: Max assist Dynamic Standing Balance Dynamic Standing - Balance Support: During functional activity;No upper extremity supported Dynamic Standing - Level of Assistance: 2: Max assist Extremity/Trunk Assessment RUE Assessment RUE Assessment: Exceptions to [facility] Passive Range of Motion (PROM) Comments: Limitations in shoulder flexion 0-110 degrees with increased tone.  Elbow flexion/extension, wrist extension and digit extension also limited secondary increased tone. Active Range of Motion (AROM) Comments: No active movement noted General Strength Comments: Brunnstrum stage II in the arm and hand with no active movement but increased tone in the pectoral, lats, elbow flexors, digit flexors LUE Assessment LUE Assessment: Within Functional Limits General Strength Comments: Strength at least 3+/5 but not formally assessed secondary to cognition  Care Tool Care Tool Self Care Eating   Eating Assist Level: Minimal Assistance - Patient > 75%    Oral Care    Oral Care Assist Level: Supervision/Verbal cueing    Bathing    Body parts bathed by patient: Chest;Abdomen;Face;Right upper leg;Left upper leg Body parts bathed by helper: Buttocks;Front perineal area;Left lower leg;Right lower leg;Face   Assist Level: Maximal Assistance - Patient 24 - 49%    Upper Body Dressing(including orthotics)   What is the patient wearing?: Pull over shirt   Assist Level: Maximal Assistance - Patient 25 - 49%    Lower Body Dressing (excluding footwear)   What is the patient wearing?: Pants;Incontinence brief Assist for lower body dressing: Total Assistance - Patient < 25%    Putting on/Taking off footwear   What is the patient wearing?: Non-skid slipper socks Assist for footwear: Total Assistance - Patient < 25%       Care Tool Toileting Toileting activity   Assist for toileting: Total Assistance - Patient < 25%     Care Tool Bed Mobility Roll left and right activity   Roll left and right assist level: Moderate Assistance - Patient 50 - 74%    Sit to lying activity   Sit to lying assist level: Moderate Assistance - Patient 50 - 74%    Lying to sitting on  side of bed activity   Lying to sitting on side of bed assist level: the ability to move from lying on the back to sitting on the side of the bed with no back support.: Moderate Assistance - Patient 50 - 74%     Care Tool Transfers Sit to stand transfer   Sit to stand assist level: Moderate Assistance - Patient 50 - 74%    Chair/bed transfer   Chair/bed transfer assist level: Moderate Assistance - Patient 50 - 74%     Toilet transfer         Care Tool Cognition  Expression of Ideas and Wants Expression of Ideas and Wants: 1. Rarely/Never expressess or very difficult - rarely/never expresses self or speech is very difficult to understand  Understanding Verbal and Non-Verbal Content Understanding Verbal and Non-Verbal Content: 2. Sometimes understands - understands only basic conversations or simple, direct phrases. Frequently requires cues to understand    Memory/Recall Ability Memory/Recall Ability : None of the above were recalled   Refer to Care Plan for Long Term Goals  SHORT TERM GOAL WEEK 1 OT Short Term Goal 1 (Week 1): Pt will complete bathing sit to stand with no more than mod assist. OT Short Term Goal 2 (Week 1): Pt will maintain sustained attention to selfcare tasks with no more than min instructional cueing during selfcare tasks for 20 mins. OT Short Term Goal 3 (Week 1): Pt will complete stand pivot toilet transfer to the 3:1 with min assist. OT Short Term Goal 4 (Week 1): Pt will complete UB dressing with min assist and mod instructional cueing for sequencing and initaition.  Recommendations for other services: None    Skilled Therapeutic Intervention ADL ADL Eating: Minimal assistance Where Assessed-Eating: Bed level Grooming: Minimal assistance Where Assessed-Grooming: Edge of bed Upper Body Bathing: Moderate assistance Where Assessed-Upper Body Bathing: Edge of bed Lower Body Bathing: Maximal assistance Where Assessed-Lower Body Bathing: Edge of bed Upper Body Dressing: Maximal assistance Where Assessed-Upper Body Dressing: Wheelchair Lower Body Dressing: Dependent Where Assessed-Lower Body Dressing: Wheelchair;Standing at sink;Sitting at sink Toileting: Dependent Where Assessed-Toileting: Bedside Commode Toilet Transfer: Dependent Toilet Transfer Method: Stand pivot Science writer: Radiographer, therapeutic: Not assessed Social research officer, government: Not assessed Mobility  Bed Mobility Bed Mobility: Supine to Sit Rolling Right: Moderate Assistance - Patient 50-74% Rolling Left: Moderate Assistance - Patient 50-74% Supine to Sit: Moderate Assistance - Patient 50-74% Sit to Supine: Moderate Assistance - Patient 50-74% Transfers Sit to Stand: Moderate Assistance - Patient 50-74% Stand to Sit: Moderate Assistance - Patient 50-74%  Session Note:  Pt in bed to start session with expressive  aphasia noted.  Pt with only two occasions during session where she would say "yes" in response to therapist question, but this was still inconsistent.  She transferred to the EOB with mod assist to work on bathing and dressing tasks.  Max demonstrational cueing for sequence bathing as she perseverated on washing her face when presented with washcloth.  No attempts to incorporate the LUE which remained flexed with increased tone at the elbow, shoulder, wrist, and digit flexors.  Pt's daughter came in during session and brought pt's resting hand splint was donned by therapist at the end of the session.  She was able to complete UB bathing at mod assist with dressing at max assist level for pullover shirt.  She needed mod assist for sit to stand but overall max assist for standing balance while working on bathing or dressing tasks secondary  to increased LOB posteriorly and right.  Noted bladder incontinence as well after standing, requiring mod assist transfer to the wheelchair to remove sheets and to donn new brief and pants.  Pt with no initiation to complete donning her shirt, brief, or pants, which daughter reports that she could complete at home.  Pt's daughter reports that pt could complete toilet transfers to the [facility] at the end of the bed at home on her own.  Finished session with pt sitting in the wheelchair with the call button and phone in reach with daughter present.  Discussed expectations for 2.5-3 week LOS and pt needing more assist such as min assist level at discharge.    Discharge Criteria: Patient will be discharged from OT if patient refuses treatment 3 consecutive times without medical reason, if treatment goals not met, if there is a change in medical status, if patient makes no progress towards goals or if patient is discharged from hospital.  The above assessment, treatment plan, treatment alternatives and goals were discussed and mutually agreed upon: by patient and by  family  Cherika Jessie OTR/L 09/25/2021, 4:12 PM

## 2021-09-25 NOTE — Evaluation (Signed)
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 No pertinent surgical history.  Assessment / Plan / Recommendation Clinical Impression Patient is a 65 y/o female with PMH of LMCA in 2021 (on CIR), Afib, and HTN, who presents to [facility] on 12/18 with N/V diaphoresis, and malaise.  Daughter is primary caregiver and provides history in ED.  Workup revealed pyleonephritis for which she was started on rocephin. While admitted pt with multiple episodes of potential seizure.  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  EEG with no evidence of seizure or epileptiform discahrges.  CTA with patent vasculature.  Therapy ongoing and pt was recommended for CIR. Patient admitted 09/24/21.  Patient was administered a cognitive-linguistic evaluation and BSE.  Patient with a h/o aphasia with apraxia and was using an AAC device at home to maximize functional communication. However, family was not present to confirm. SLP administered the [place] Test: [facility] with Objects.  Patient scored 37/64 points with deficits in complex comprehension, verbal expression, reading comprehension and written expression.  Patient demonstrated deficits in yes/no accuracy that were further complicated by apraxia as she often verbalized yes but would shake her head, no.  Patient's deficits in motor  planning also impacted her ability to follow commands. Patient able to verbalize her name and birthday but all other verbalizations were unintelligible with minimal awareness of errors. Patient often utilized gestures to make basic needs known.  Suspect patient's overall functional communication deficits are exacerbated at this time and further complicated by not having her AAC device.  Due patient's severe deficits in functional communication, assessing cognitive functioning in depth was difficult, however, deficits in attention and affect were noted.   Patient with baseline oral-motor deficits characterized by right facial and lingual asymmetry and weakness.  Patient consumed large sips of thin liquids via straw without overt s/s of aspiration but did demonstrate what appeared to be a delay in swallow initiation. Patient with mildly prolonged mastication of regular textures with mild right anterior spillage and buccal pocketing that cleared with liquid washes. Mild verbal cues needed for use of small bites/sips. Recommend patient continue current diet of regular textures and thin liquids but a downgrade of solids may be warranted to maximize safety if patient's overall impulsivity and use of strategies does not improve.   Patient would benefit skilled SLP intervention to maximize her functional communication and swallowing function prior to discharge.     Skilled Therapeutic Interventions          Administered a cognitive-linguistic evaluation and BSE, please see above for details.   SLP Assessment  Patient will need skilled Speech Lanaguage Pathology Services during CIR admission    Recommendations  SLP Diet Recommendations: Age appropriate regular solids;Thin Liquid Administration via: Cup;Straw Medication Administration: Whole meds with puree Supervision: Patient able to self feed;Full supervision/cueing for compensatory strategies Compensations: Minimize environmental distractions;Slow  rate;Small sips/bites;Lingual sweep for clearance of pocketing;Follow solids with liquid;Monitor for anterior loss  Postural Changes and/or Swallow Maneuvers: Seated upright 90 degrees Oral Care Recommendations: Oral care BID Patient destination: Home Follow up Recommendations: Outpatient SLP;24 hour supervision/assistance;Home Health SLP Equipment Recommended: None recommended by SLP    SLP Frequency 3 to 5 out of 7 days   SLP Duration  SLP Intensity  SLP Treatment/Interventions 10-14 days  Minumum of 1-2 x/day, 30 to 90 minutes  Cognitive remediation/compensation;Dysphagia/aspiration precaution training;Internal/external aids;Speech/Language facilitation;Cueing hierarchy;Environmental controls;Therapeutic Activities;Functional tasks;Patient/family education    Pain No/Denies Pain   Prior Functioning Type of Home: House  Lives With: Daughter Available Help at Discharge: Family  SLP Evaluation Cognition Overall Cognitive Status: History of cognitive impairments - at baseline Arousal/Alertness: Awake/alert Orientation Level: Oriented to person Attention: Focused;Sustained Focused Attention: Appears intact Sustained Attention: Impaired Sustained Attention Impairment: Verbal basic;Functional basic Problem Solving: Impaired Problem Solving Impairment: Functional basic Safety/Judgment: Appears intact  Comprehension Auditory Comprehension Overall Auditory Comprehension: Impaired Yes/No Questions: Impaired Basic Biographical Questions: 51-75% accurate Basic Immediate Environment Questions: 50-74% accurate Complex Questions: 0-24% accurate Commands: Impaired One Step Basic Commands: 25-49% accurate Conversation: Simple Interfering Components: Motor planning;Processing speed;Attention EffectiveTechniques: Repetition;Extra processing time Visual Recognition/Discrimination Discrimination: Within Function Limits Reading Comprehension Reading Status:  Impaired Expression Expression Primary Mode of Expression: Verbal Verbal Expression Overall Verbal Expression: Impaired Initiation: Impaired Automatic Speech: Social Response;Name Level of Generative/Spontaneous Verbalization: Word Repetition: Impaired Level of Impairment: Word level Naming: Impairment Responsive: 0-25% accurate Confrontation: Impaired Verbal Errors: Neologisms;Perseveration;Not aware of errors Pragmatics: Impairment Impairments: Abnormal affect Written Expression Dominant Hand: Right Written Expression: Exceptions to [facility] Oral Motor Oral Motor/Sensory Function Overall Oral Motor/Sensory Function: Mild impairment Facial Symmetry: Abnormal symmetry right;Suspected CN VII (facial) dysfunction Facial Strength: Reduced right Lingual Symmetry: Abnormal symmetry right Lingual Strength: Reduced Motor Speech Overall Motor Speech: Impaired Phonation: Normal Resonance: Within functional limits Articulation: Impaired Intelligibility: Intelligibility reduced Motor Planning: Impaired Level of Impairment: Word Motor Speech Errors: Groping for words;Unaware;Consistent Effective Techniques: Increased vocal intensity  Care Tool Care Tool Cognition Ability to hear (with hearing aid or hearing appliances if normally used Ability to hear (with hearing aid or hearing appliances if normally used): 0. Adequate - no difficulty in normal conservation, social interaction, listening to TV   Expression of Ideas and Wants Expression of Ideas and Wants: 1. Rarely/Never expressess or very difficult - rarely/never expresses self or speech is very difficult to understand   Understanding Verbal and Non-Verbal Content Understanding Verbal and Non-Verbal Content: 2. Sometimes understands - understands only basic conversations or simple, direct phrases. Frequently requires cues to understand  Memory/Recall Ability Memory/Recall Ability : None of the above were recalled    Bedside  Swallowing Assessment General Date of Onset: 09/19/21 Previous Swallow Assessment: see HPI Diet Prior to this Study: Regular;Thin liquids Temperature Spikes Noted: No Respiratory Status: Room air Behavior/Cognition: Alert;Requires cueing Oral Cavity - Dentition: Missing dentition;Dentures, not available Self-Feeding Abilities: Able to feed self;Needs assist Vision: Functional for self-feeding Patient Positioning: Upright in chair/Tumbleform Baseline Vocal Quality: Low vocal intensity Volitional Cough: Cognitively unable to elicit Volitional Swallow: Unable to elicit  Ice Chips Ice chips: Within functional limits Presentation: Spoon Thin Liquid Thin Liquid: Impaired Presentation: Cup;Straw;Self Fed Oral Phase Functional Implications: Right anterior spillage Pharyngeal  Phase Impairments: Suspected delayed Swallow Nectar Thick Nectar Thick Liquid: Not tested Honey Thick Honey Thick Liquid: Not tested Puree Puree: Impaired Presentation: Self Fed;Spoon Oral Phase Functional Implications: Right anterior spillage Solid Solid: Impaired Presentation: Self Fed;Spoon Oral Phase Impairments: Impaired mastication Oral Phase Functional Implications: Right lateral sulci pocketing;Right anterior spillage  BSE Assessment Risk for Aspiration Impact on safety and function: Mild aspiration risk Other Related Risk Factors: History of dysphagia;Previous CVA  Short Term Goals: Week 1: SLP Short Term Goal 1 (Week 1): Patient will consume current diet with minimal overt s/s of aspiration with Min verbal cues for use of swallowing compensatory strategies. SLP Short Term Goal 2 (Week 1): Patient will demonstrate selective attention to a task in a mildly distracting enviornment for 30 minutes with Min verbal cues for redirection. SLP Short Term Goal 3 (Week 1): Patient will utilize multimodal communication to express wants/needs with Mod A multimodal cues. SLP Short Term Goal 4 (Week 1): Patient  will answer mildly abstract yes/no questions with 75% accuracy and Mod verbal cues. SLP Short Term Goal 5 (Week 1): Patient will approximate 3 words that are personally relevant/significant with 50% accuracy and Mod A multimodal cues.  Refer to Care Plan for Long Term Goals  Recommendations for other services: None   Discharge Criteria: Patient will be discharged from SLP if patient refuses treatment 3 consecutive times without medical reason, if treatment goals not met, if there is a change in medical status, if patient makes no progress towards goals or if patient is discharged from hospital.  The above assessment, treatment plan, treatment alternatives and goals were discussed and mutually agreed upon: No family available/patient unable  [place], [place] 09/25/2021, 2:03 PM

## 2021-09-25 NOTE — Progress Notes (Signed)
PROGRESS NOTE   Subjective/Complaints: Pt in her bed. Appears to be comfortable. Had an episode of emesis last night after receiving meds. Seemed to have tolerated breakfast this morning  ROS: limited due to language/communication    Objective:   DG Abd 1 View  Result Date: 09/24/2021 CLINICAL DATA:  Abdominal pain, vomiting EXAM: ABDOMEN - 1 VIEW COMPARISON:  None. FINDINGS: The bowel gas pattern is normal. No radio-opaque calculi or other significant radiographic abnormality are seen. IMPRESSION: Nonobstructive pattern of bowel gas. No obvious free air in the abdomen on single supine radiograph. Electronically Signed   By: Delanna Ahmadi M.D.   On: 09/24/2021 14:20   Recent Labs    09/24/21 0203 09/24/21 1234  WBC 4.4 5.0  HGB 11.2* 11.3*  HCT 33.5* 33.2*  PLT 413* 433*   Recent Labs    09/23/21 0233 09/24/21 0203  NA 134* 135  K 3.9 3.5  CL 99 103  CO2 21* 24  GLUCOSE 135* 112*  BUN 8 9  CREATININE 0.79 0.79  CALCIUM 9.5 9.0    Intake/Output Summary (Last 24 hours) at 09/25/2021 1157 Last data filed at 09/25/2021 0900 Gross per 24 hour  Intake 0 ml  Output 1 ml  Net -1 ml        Physical Exam: Vital Signs Blood pressure (!) 154/96, pulse (!) 106, temperature 98.7 F (37.1 C), temperature source Oral, resp. rate 16, height 5' 4.17" (1.63 m), weight 66.3 kg.  General: Alert and oriented x 3, No apparent distress HEENT: Head is normocephalic, atraumatic, PERRLA, EOMI, sclera anicteric, oral mucosa pink and moist, dentition intact, ext ear canals clear,  Neck: Supple without JVD or lymphadenopathy Heart: Reg rate and rhythm. No murmurs rubs or gallops Chest: CTA bilaterally without wheezes, rales, or rhonchi; no distress Abdomen: Soft, non-tender, non-distended, bowel sounds positive. Extremities: No clubbing, cyanosis, or edema. Pulses are 2+ Psych: Pt's affect is appropriate. Pt is cooperative Skin:  Clean and intact without signs of breakdown Neuro:  pt alert, makes eye contacts. Follows basic commands with cues and attempts to phonate. Generally aphasic, ex>rec. RUE RLE are 1 to 1+/5 and inconsistent. She's 4-5/5 on left. DTR's 3+ on right. Sl underlying hypertonicity on right. Sensed pain on right side but less than lef Musculoskeletal: no tenderness in joints or trunk/neck with palpation    Assessment/Plan: 1. Functional deficits which require 3+ hours per day of interdisciplinary therapy in a comprehensive inpatient rehab setting. Physiatrist is providing close team supervision and 24 hour management of active medical problems listed below. Physiatrist and rehab team continue to assess barriers to discharge/monitor patient progress toward functional and medical goals  Care Tool:  Bathing              Bathing assist       Upper Body Dressing/Undressing Upper body dressing        Upper body assist      Lower Body Dressing/Undressing Lower body dressing      What is the patient wearing?: Hospital gown only     Lower body assist Assist for lower body dressing: Total Assistance - Patient < 25%     Toileting Toileting  Toileting assist Assist for toileting: Total Assistance - Patient < 25%     Transfers Chair/bed transfer  Transfers assist           Locomotion Ambulation   Ambulation assist              Walk 10 feet activity   Assist           Walk 50 feet activity   Assist           Walk 150 feet activity   Assist           Walk 10 feet on uneven surface  activity   Assist           Wheelchair     Assist               Wheelchair 50 feet with 2 turns activity    Assist            Wheelchair 150 feet activity     Assist          Blood pressure (!) 154/96, pulse (!) 106, temperature 98.7 F (37.1 C), temperature source Oral, resp. rate 16, height 5' 4.17" (1.63 m), weight 66.3  kg.  Medical Problem List and Plan: 1. 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  Antithrombotics: -DVT/anticoagulation:  Pharmaceutical: Other (comment) Eliquis             -antiplatelet therapy: Aspirin 81 mg 3. Pain Management: Tylenol 4. Mood: LCSW to provide emotional support             -antipsychotic agents: n/a 5. Neuropsych: This patient is not capable of making decisions on her own behalf. 6. Skin/Wound Care: routine skin care checks 7. Fluids/Electrolytes/Nutrition: Is and Os and follow-up chemistries 8. Possible seizure activity on Keppra 9: Pyelonephritis/UTI: culture + Klebsiella on Levaquin             -remains afebrile 10: Follow-up blood culture results; if positive, then TEE  12/24 blood cx negative x2 for 2 days 11: Hypertension: amlodipine. Permissive hypertension (OK if < 220/120) but gradually normalize in 5-7 days             -bp at "allowable" levels currently. Follow for trend 12:Atrial fibrillation: on eliquis  -HR a little tachy this morning 13: Hyperlipidemia: on atorvastatin. LDL = 61 14. Nausea/vomiting: might be med related  -KUB reviewed and unremarkable. Had bm 12/23  -tolerated breakfast this morning but vomitus has been reported as undigested food  -will add low dose reglan before meals  -observe for pattern, spread out med amin    LOS: 1 days A FACE TO FACE EVALUATION WAS PERFORMED  Ranelle Oyster 09/25/2021, 11:57 AM

## 2021-09-26 ENCOUNTER — Inpatient Hospital Stay (HOSPITAL_COMMUNITY)
Admission: AD | Admit: 2021-09-26 | Discharge: 2021-10-01 | DRG: 690 | Disposition: A | Discharge status: Rehabilitation Facility | Payer: Medicare Other | Source: Ambulatory Visit | Attending: Internal Medicine | Admitting: Internal Medicine

## 2021-09-26 ENCOUNTER — Inpatient Hospital Stay (HOSPITAL_COMMUNITY): Payer: Medicare Other

## 2021-09-26 DIAGNOSIS — I10 Essential (primary) hypertension: Secondary | ICD-10-CM | POA: Diagnosis present

## 2021-09-26 DIAGNOSIS — I69320 Aphasia following cerebral infarction: Secondary | ICD-10-CM

## 2021-09-26 DIAGNOSIS — E872 Acidosis, unspecified: Secondary | ICD-10-CM | POA: Diagnosis present

## 2021-09-26 DIAGNOSIS — D6862 Lupus anticoagulant syndrome: Secondary | ICD-10-CM | POA: Diagnosis present

## 2021-09-26 DIAGNOSIS — N12 Tubulo-interstitial nephritis, not specified as acute or chronic: Secondary | ICD-10-CM | POA: Diagnosis present

## 2021-09-26 DIAGNOSIS — R569 Unspecified convulsions: Secondary | ICD-10-CM | POA: Diagnosis present

## 2021-09-26 DIAGNOSIS — N280 Ischemia and infarction of kidney: Secondary | ICD-10-CM | POA: Diagnosis present

## 2021-09-26 DIAGNOSIS — Z7982 Long term (current) use of aspirin: Secondary | ICD-10-CM | POA: Diagnosis not present

## 2021-09-26 DIAGNOSIS — R471 Dysarthria and anarthria: Secondary | ICD-10-CM | POA: Diagnosis present

## 2021-09-26 DIAGNOSIS — D649 Anemia, unspecified: Secondary | ICD-10-CM | POA: Diagnosis present

## 2021-09-26 DIAGNOSIS — B961 Klebsiella pneumoniae [K. pneumoniae] as the cause of diseases classified elsewhere: Secondary | ICD-10-CM | POA: Diagnosis present

## 2021-09-26 DIAGNOSIS — R531 Weakness: Secondary | ICD-10-CM | POA: Diagnosis not present

## 2021-09-26 DIAGNOSIS — A419 Sepsis, unspecified organism: Secondary | ICD-10-CM | POA: Diagnosis not present

## 2021-09-26 DIAGNOSIS — M79605 Pain in left leg: Secondary | ICD-10-CM | POA: Diagnosis not present

## 2021-09-26 DIAGNOSIS — Z79899 Other long term (current) drug therapy: Secondary | ICD-10-CM | POA: Diagnosis not present

## 2021-09-26 DIAGNOSIS — I4891 Unspecified atrial fibrillation: Secondary | ICD-10-CM | POA: Diagnosis present

## 2021-09-26 DIAGNOSIS — I63542 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: Secondary | ICD-10-CM

## 2021-09-26 DIAGNOSIS — B9689 Other specified bacterial agents as the cause of diseases classified elsewhere: Secondary | ICD-10-CM | POA: Diagnosis not present

## 2021-09-26 DIAGNOSIS — Z7901 Long term (current) use of anticoagulants: Secondary | ICD-10-CM

## 2021-09-26 DIAGNOSIS — I69351 Hemiplegia and hemiparesis following cerebral infarction affecting right dominant side: Secondary | ICD-10-CM

## 2021-09-26 DIAGNOSIS — E785 Hyperlipidemia, unspecified: Secondary | ICD-10-CM | POA: Diagnosis present

## 2021-09-26 DIAGNOSIS — N151 Renal and perinephric abscess: Secondary | ICD-10-CM | POA: Diagnosis present

## 2021-09-26 DIAGNOSIS — N179 Acute kidney failure, unspecified: Secondary | ICD-10-CM | POA: Diagnosis present

## 2021-09-26 DIAGNOSIS — N10 Acute tubulo-interstitial nephritis: Secondary | ICD-10-CM | POA: Diagnosis not present

## 2021-09-26 LAB — BASIC METABOLIC PANEL
Anion gap: 10 (ref 5–15)
BUN: UNDETERMINED mg/dL (ref 8–23)
BUN: 17 mg/dL (ref 8–23)
CO2: UNDETERMINED mmol/L (ref 22–32)
CO2: 18 mmol/L — ABNORMAL LOW (ref 22–32)
Calcium: UNDETERMINED mg/dL (ref 8.9–10.3)
Calcium: 9 mg/dL (ref 8.9–10.3)
Chloride: UNDETERMINED mmol/L (ref 98–111)
Chloride: 105 mmol/L (ref 98–111)
Creatinine, Ser: UNDETERMINED mg/dL (ref 0.44–1.00)
Creatinine, Ser: 1.69 mg/dL — ABNORMAL HIGH (ref 0.44–1.00)
GFR, Estimated: 33 mL/min — ABNORMAL LOW (ref >60)
Glucose, Bld: UNDETERMINED mg/dL (ref 70–99)
Glucose, Bld: 114 mg/dL — ABNORMAL HIGH (ref 70–99)
Potassium: UNDETERMINED mmol/L (ref 3.5–5.1)
Potassium: 4.2 mmol/L (ref 3.5–5.1)
Sodium: UNDETERMINED mmol/L (ref 135–145)
Sodium: 133 mmol/L — ABNORMAL LOW (ref 135–145)

## 2021-09-26 LAB — CBC
HCT: 38.8 % (ref 36.0–46.0)
Hemoglobin: 13 g/dL (ref 12.0–15.0)
MCH: 29.1 pg (ref 26.0–34.0)
MCHC: 33.5 g/dL (ref 30.0–36.0)
MCV: 86.8 fL (ref 80.0–100.0)
Platelets: 377 10*3/uL (ref 150–400)
RBC: 4.47 MIL/uL (ref 3.87–5.11)
RDW: 15 % (ref 11.5–15.5)
WBC: 30 10*3/uL — ABNORMAL HIGH (ref 4.0–10.5)
nRBC: 0 % (ref 0.0–0.2)

## 2021-09-26 LAB — LACTIC ACID, PLASMA
Lactic Acid, Venous: 2 mmol/L (ref 0.5–1.9)
Lactic Acid, Venous: 2.6 mmol/L (ref 0.5–1.9)

## 2021-09-26 LAB — TROPONIN I (HIGH SENSITIVITY): Troponin I (High Sensitivity): 39 ng/L — ABNORMAL HIGH (ref <18)

## 2021-09-26 LAB — MRSA NEXT GEN BY PCR, NASAL: MRSA by PCR Next Gen: NOT DETECTED

## 2021-09-26 LAB — CK TOTAL AND CKMB (NOT AT ARMC)
CK, MB: UNDETERMINED ng/mL (ref 0.5–5.0)
Total CK: UNDETERMINED U/L (ref 38–234)

## 2021-09-26 LAB — GLUCOSE, CAPILLARY: Glucose-Capillary: 111 mg/dL — ABNORMAL HIGH (ref 70–99)

## 2021-09-26 IMAGING — CT CT ABD-PELV W/ CM
2 of 5 series · 14 of 46 positions shown, 16 images · IV contrast (omnipaque)
Comparison: MRI [DATE], CT [DATE]

CLINICAL DATA: Nausea vomiting pyelonephritis

EXAM:
CT ABDOMEN AND PELVIS WITH CONTRAST
TECHNIQUE: Multidetector CT imaging of the abdomen and pelvis was performed
using the standard protocol following bolus administration of
intravenous contrast.
CONTRAST:  80mL OMNIPAQUE IOHEXOL 300 MG/ML  SOLN

[Series 3: abdomen 5.0 · axial · 0.66mm/px · z∈[-471,-86]mm · 11 of 92 slices shown, 13 images]
[im 8/92  soft-tissue]
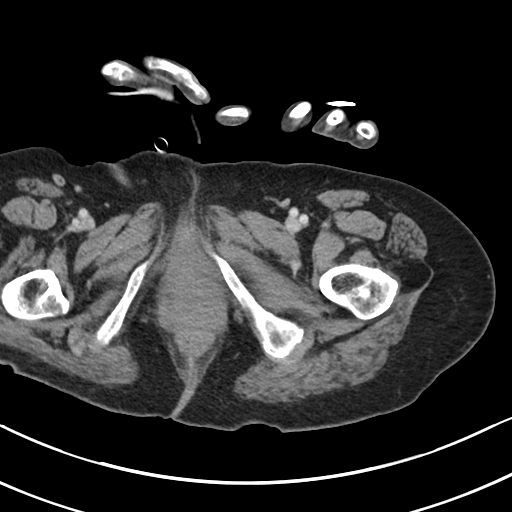
[im 8/92  bone]
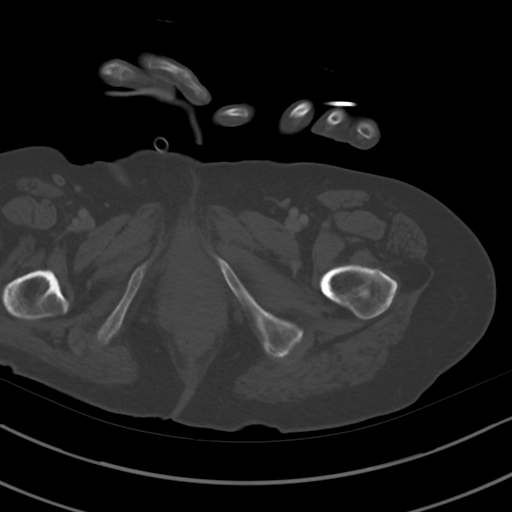
[im 15/92  soft-tissue]
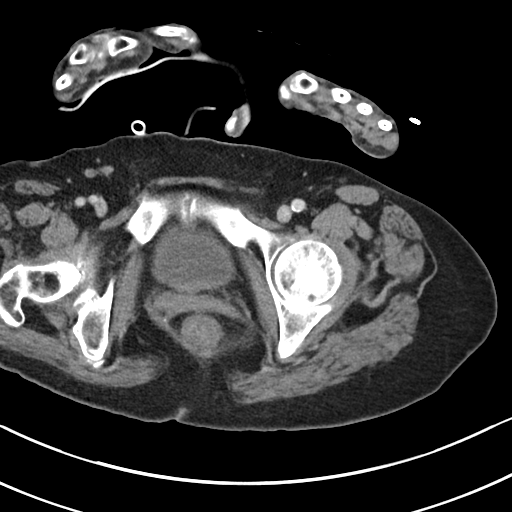
[im 22/92  soft-tissue]
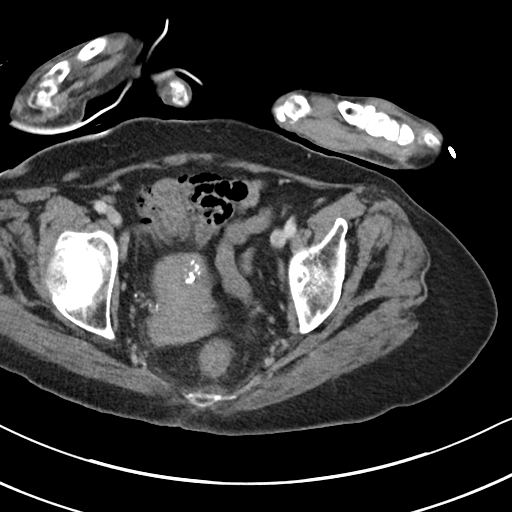
[im 29/92  soft-tissue]
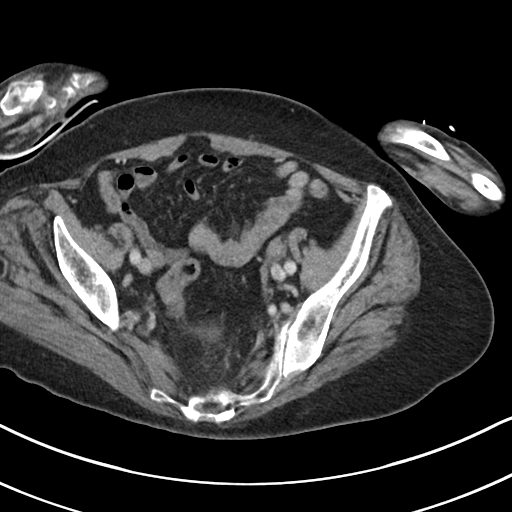
[im 36/92  soft-tissue]
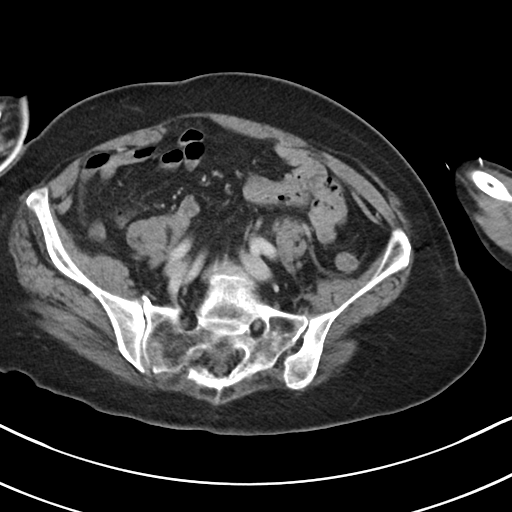
[im 50/92  soft-tissue]
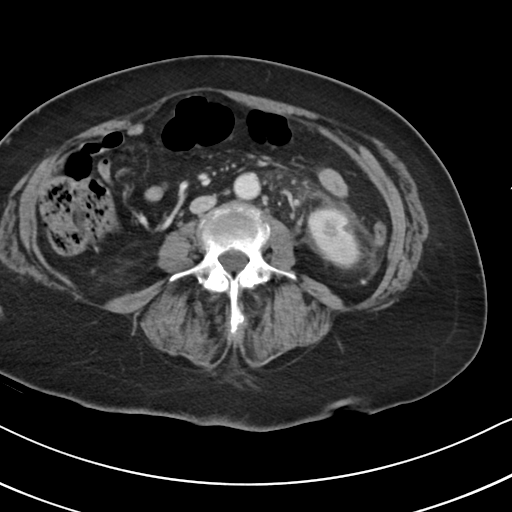
[im 57/92  soft-tissue]
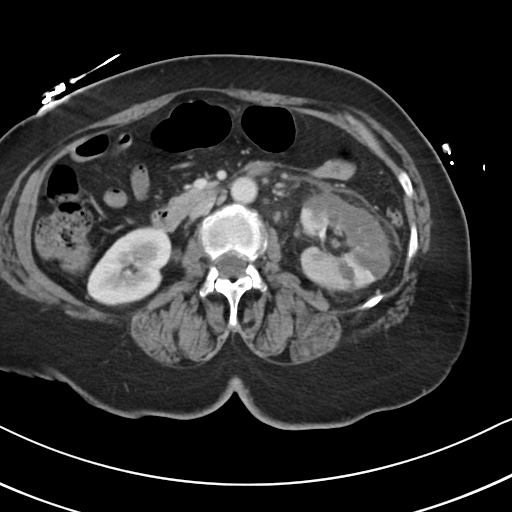
[im 64/92  soft-tissue]
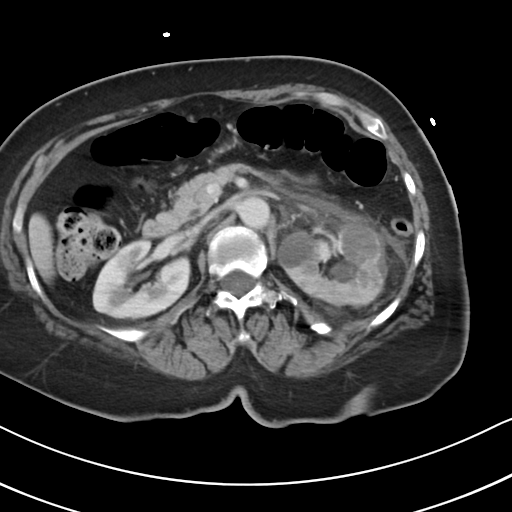
[im 71/92  soft-tissue]
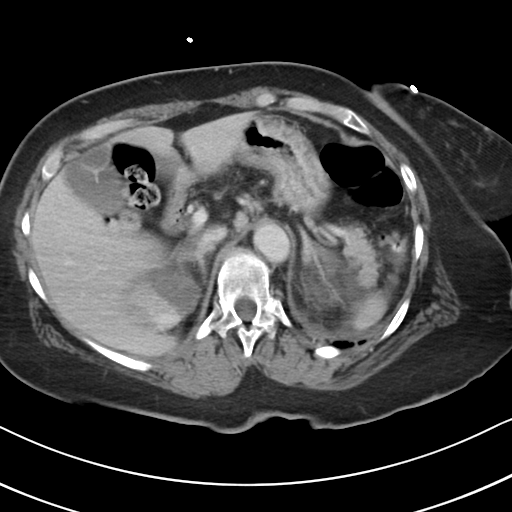
[im 71/92  bone]
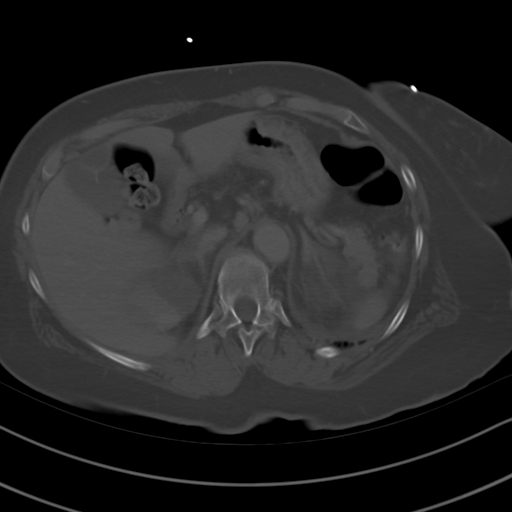
[im 78/92  soft-tissue]
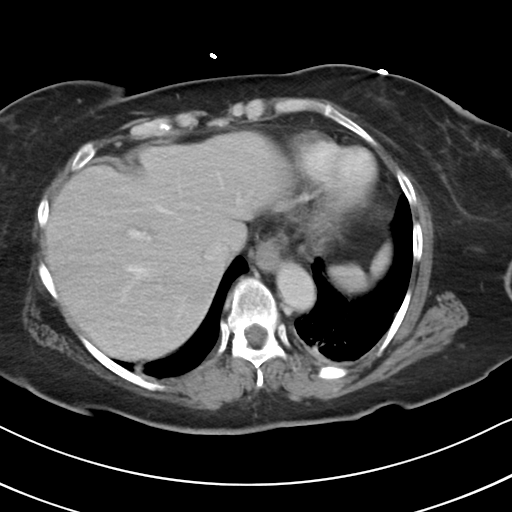
[im 85/92  soft-tissue]
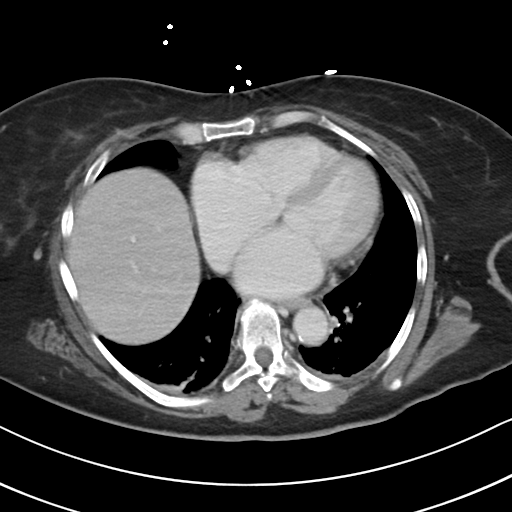

[Series 6: abdomen 3.0 mpr cor · coronal · 0.60mm/px · 3 of 88 slices shown]
[im 30/88  soft-tissue]
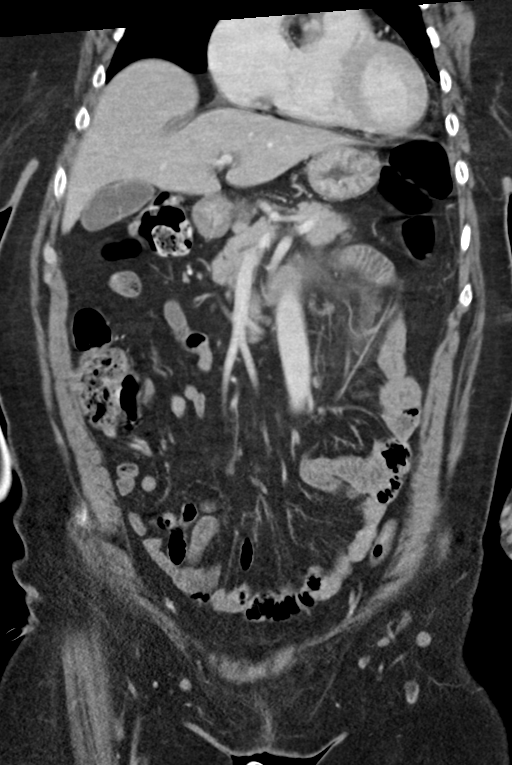
[im 39/88  soft-tissue]
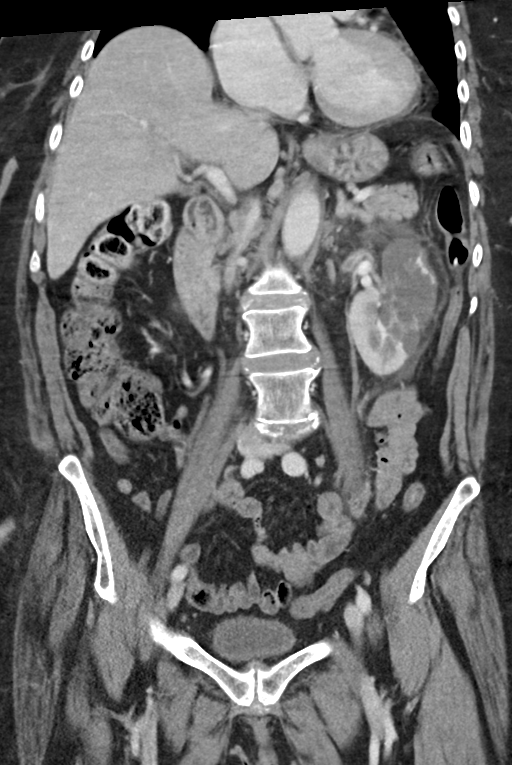
[im 49/88  soft-tissue]
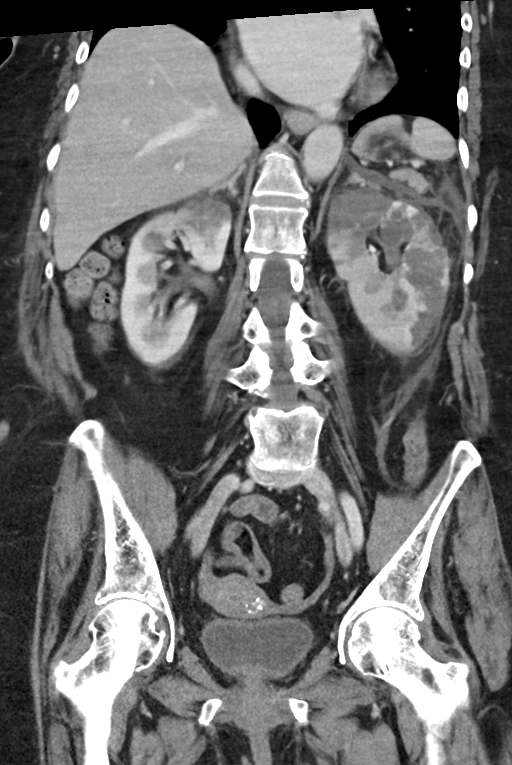

[14 of 46 positions shown; findings below may reference images not displayed]

FINDINGS: Lower chest: Lung bases demonstrate patchy atelectasis at the bases.
No pleural effusion. Cardiomegaly with biatrial enlargement.

Hepatobiliary: Multiple gallbladder folds. No calcified stones. No
focal hepatic abnormality. No biliary dilatation.

Pancreas: Unremarkable. No pancreatic ductal dilatation or
surrounding inflammatory changes.

Spleen: Normal in size without focal abnormality.

Adrenals/Urinary Tract: Adrenal glands are within normal limits.
Multifocal hypoenhancement within the right kidney, greatest
involving the upper and midpole, consistent with pyelonephritis.
More focal hypodense collection at the upper pole measuring 3.1 by
2.7 cm, suspect for phlegmon or renal abscess, this is mildly
decreased from the prior CT at which time the seen area measures
x 3.3 cm.

There are several cysts in the left kidney. Interim development of
markedly abnormal appearance of left kidney with multifocal
hypoenhancement involving large portion of the upper and midpole of
left kidney with relative sparing of the lower pole. Some peripheral
cortical enhancement is preserved at the midpole of the left kidney.
Left perinephric fat stranding. No hydronephrosis or hydroureter.
The bladder is unremarkable

Stomach/Bowel: The stomach is nonenlarged. No dilated small bowel.
No acute bowel wall thickening. Negative appendix.

Vascular/Lymphatic: Nonaneurysmal aorta with mild atherosclerosis.
New thrombus is present within the left main renal artery, series 3
image 32, and coronal series 6 image 36 through 40 on coronal views
this is approximately 10 mm distal to the renal artery origin,
thrombus extends to upper pole segmental vessels of the kidney.
Renal veins appear patent. No suspicious adenopathy.

Reproductive: Calcified uterine fibroid.  No adnexal mass.

Other: Negative for pelvic effusion or free air.

Musculoskeletal: No acute or significant osseous findings.
IMPRESSION: 1. Interim development of markedly abnormal appearance of the left
kidney with extensive areas of hypoenhancement, relative sparing of
the lower pole. New finding of thrombus within the left renal
artery. Suspect that the hypoenhancement in the left kidney is
largely due to renal infarct given the presence of thrombus in the
artery, though there may be associated areas of pyelonephritis as
well.
2. Residual patchy hypoenhancement at the right kidney, consistent
with pyelonephritis. More focal hypodense collection at the upper
pole right kidney is slightly smaller compared to the prior CT and
may reflect phlegmon or resolving abscess (favored), with infarct
also in the differential.
3. Calcified uterine fibroid

Critical Value/emergent results were called by telephone at the time
of interpretation on [DATE] at [DATE] to provider Dr. THIJ
THIJ, Who verbally acknowledged these results.

## 2021-09-26 MED ORDER — POLYETHYLENE GLYCOL 3350 17 G PO PACK: 17.0000 g | PACK | Freq: Every day | ORAL | Status: DC | PRN

## 2021-09-26 MED ORDER — ACETAMINOPHEN 650 MG RE SUPP
650.0000 mg | Freq: Four times a day (QID) | RECTAL | Status: DC | PRN
  Administered 2021-09-26: 23:00:00 650 mg via RECTAL
  Filled 2021-09-26: qty 1

## 2021-09-26 MED ORDER — ACETAMINOPHEN 325 MG PO TABS: 650.0000 mg | ORAL_TABLET | Freq: Four times a day (QID) | ORAL | Status: DC | PRN

## 2021-09-26 MED ORDER — HEPARIN (PORCINE) 25000 UT/250ML-% IV SOLN
950.0000 [IU]/h | INTRAVENOUS | Status: DC
  Administered 2021-09-26: 1000 [IU]/h via INTRAVENOUS
  Administered 2021-09-27: 22:00:00 950 [IU]/h via INTRAVENOUS
  Filled 2021-09-26 (×2): qty 250

## 2021-09-26 MED ORDER — APIXABAN 5 MG PO TABS: 5.0000 mg | ORAL_TABLET | Freq: Two times a day (BID) | ORAL | Status: DC

## 2021-09-26 MED ORDER — LEVETIRACETAM 500 MG PO TABS: 500.0000 mg | ORAL_TABLET | Freq: Two times a day (BID) | ORAL | Status: DC

## 2021-09-26 MED ORDER — ONDANSETRON HCL 4 MG PO TABS: 4.0000 mg | ORAL_TABLET | Freq: Two times a day (BID) | ORAL | Status: DC | PRN

## 2021-09-26 MED ORDER — PIPERACILLIN-TAZOBACTAM 3.375 G IVPB
3.3750 g | Freq: Three times a day (TID) | INTRAVENOUS | Status: DC
  Administered 2021-09-26 – 2021-09-27 (×2): 3.375 g via INTRAVENOUS
  Filled 2021-09-26 (×3): qty 50

## 2021-09-26 MED ORDER — ASPIRIN EC 81 MG PO TBEC
81.0000 mg | DELAYED_RELEASE_TABLET | Freq: Every day | ORAL | Status: DC
  Administered 2021-09-27 – 2021-10-01 (×5): 81 mg via ORAL
  Filled 2021-09-26 (×5): qty 1

## 2021-09-26 MED ORDER — METOPROLOL TARTRATE 50 MG PO TABS: 50.0000 mg | ORAL_TABLET | Freq: Two times a day (BID) | ORAL | Status: DC

## 2021-09-26 MED ORDER — IOHEXOL 300 MG/ML  SOLN
80.0000 mL | Freq: Once | INTRAMUSCULAR | Status: AC | PRN
  Administered 2021-09-26: 17:00:00 80 mL via INTRAVENOUS

## 2021-09-26 MED ORDER — LACTATED RINGERS IV SOLN: INTRAVENOUS | Status: AC

## 2021-09-26 MED ORDER — ATORVASTATIN CALCIUM 10 MG PO TABS
20.0000 mg | ORAL_TABLET | Freq: Every day | ORAL | Status: DC
  Administered 2021-09-27 – 2021-09-30 (×4): 20 mg via ORAL
  Filled 2021-09-26 (×4): qty 2

## 2021-09-26 MED ORDER — LEVETIRACETAM IN NACL 500 MG/100ML IV SOLN
500.0000 mg | Freq: Two times a day (BID) | INTRAVENOUS | Status: DC
  Administered 2021-09-26 – 2021-09-30 (×8): 500 mg via INTRAVENOUS
  Filled 2021-09-26 (×8): qty 100

## 2021-09-26 MED ORDER — VANCOMYCIN VARIABLE DOSE PER UNSTABLE RENAL FUNCTION (PHARMACIST DOSING): Status: DC

## 2021-09-26 MED ORDER — LACTATED RINGERS IV BOLUS
500.0000 mL | Freq: Once | INTRAVENOUS | Status: AC
  Administered 2021-09-26: 18:00:00 500 mL via INTRAVENOUS

## 2021-09-26 MED ORDER — ACETAMINOPHEN 650 MG RE SUPP: 650.0000 mg | RECTAL | Status: DC | PRN

## 2021-09-26 MED ORDER — DILTIAZEM HCL-DEXTROSE 125-5 MG/125ML-% IV SOLN (PREMIX)
5.0000 mg/h | INTRAVENOUS | Status: DC
  Administered 2021-09-26 – 2021-09-28 (×3): 5 mg/h via INTRAVENOUS
  Administered 2021-09-28: 02:00:00 12 mg/h via INTRAVENOUS
  Filled 2021-09-26 (×4): qty 125

## 2021-09-26 MED ORDER — METOPROLOL TARTRATE 5 MG/5ML IV SOLN
5.0000 mg | Freq: Once | INTRAVENOUS | Status: AC
  Administered 2021-09-26: 08:00:00 5 mg via INTRAVENOUS
  Filled 2021-09-26: qty 5

## 2021-09-26 NOTE — Progress Notes (Signed)
PROGRESS NOTE   Subjective/Complaints: Pt with temp up to 102.5. Tachy into 130's. Pt awake in bed. Flat, fairly alert. Doesn't engage much  ROS: limited due to language/communication    Objective:   DG Abd 1 View  Result Date: 09/24/2021 CLINICAL DATA:  Abdominal pain, vomiting EXAM: ABDOMEN - 1 VIEW COMPARISON:  None. FINDINGS: The bowel gas pattern is normal. No radio-opaque calculi or other significant radiographic abnormality are seen. IMPRESSION: Nonobstructive pattern of bowel gas. No obvious free air in the abdomen on single supine radiograph. Electronically Signed   By: Jearld Lesch M.D.   On: 09/24/2021 14:20   DG CHEST PORT 1 VIEW  Result Date: 09/25/2021 CLINICAL DATA:  Shortness of breath. EXAM: PORTABLE CHEST 1 VIEW COMPARISON:  Chest radiograph dated 09/19/2021. FINDINGS: Left lung base reticular densities, likely atelectasis. Developing infiltrate is less likely. No focal consolidation, pleural effusion, pneumothorax. Mild cardiomegaly. Atherosclerotic calcification of the aorta. No acute osseous pathology. IMPRESSION: Left lung base atelectasis. Developing infiltrate is less likely. Electronically Signed   By: Elgie Collard M.D.   On: 09/25/2021 20:22   Recent Labs    09/24/21 1234 09/25/21 2034  WBC 5.0 26.6*  HGB 11.3* 11.8*  HCT 33.2* 33.5*  PLT 433* 419*   Recent Labs    09/24/21 0203 09/25/21 2034  NA 135 132*  K 3.5 4.1  CL 103 97*  CO2 24 21*  GLUCOSE 112* 167*  BUN 9 15  CREATININE 0.79 1.61*  CALCIUM 9.0 9.6    Intake/Output Summary (Last 24 hours) at 09/26/2021 0658 Last data filed at 09/25/2021 2224 Gross per 24 hour  Intake 360 ml  Output 150 ml  Net 210 ml        Physical Exam: Vital Signs Blood pressure (!) 138/106, pulse (!) 120, temperature 99.8 F (37.7 C), temperature source Oral, resp. rate (!) 22, height 5' 4.17" (1.63 m), weight 66.3 kg, SpO2 100 %.  General:  Alert and oriented x 3, No apparent distress HEENT: Head is normocephalic, atraumatic, PERRLA, EOMI, sclera anicteric, oral mucosa pink and moist, dentition intact, ext ear canals clear,  Neck: Supple without JVD or lymphadenopathy Heart: IRR IRR 110's.  No murmurs rubs or gallops Chest: CTA bilaterally without wheezes, rales, or rhonchi; no distress Abdomen: Soft, no obvious tenderness.  non-distended, bowel sounds positive. Extremities: No clubbing, cyanosis, or edema. Pulses are 2+ Psych: Pt's affect is appropriate. Pt is cooperative Skin: warm,  Neuro:  pt alert, makes eye contacts. Exp>recp aphasia.  RUE RLE are 1 to 1+/5 and inconsistent. She's 4-5/5 on left. DTR's 3+ on right. Sl underlying hypertonicity on right. Sensed pain on right side but less than lef Musculoskeletal: no tenderness in joints or trunk/neck with palpation    Assessment/Plan: 1. Functional deficits which require 3+ hours per day of interdisciplinary therapy in a comprehensive inpatient rehab setting. Physiatrist is providing close team supervision and 24 hour management of active medical problems listed below. Physiatrist and rehab team continue to assess barriers to discharge/monitor patient progress toward functional and medical goals  Care Tool:  Bathing    Body parts bathed by patient: Chest, Abdomen, Face, Right upper leg, Left upper  leg   Body parts bathed by helper: Buttocks, Front perineal area, Left lower leg, Right lower leg, Face     Bathing assist Assist Level: Maximal Assistance - Patient 24 - 49%     Upper Body Dressing/Undressing Upper body dressing   What is the patient wearing?: Pull over shirt    Upper body assist Assist Level: Maximal Assistance - Patient 25 - 49%    Lower Body Dressing/Undressing Lower body dressing      What is the patient wearing?: Pants, Incontinence brief     Lower body assist Assist for lower body dressing: Total Assistance - Patient < 25%      Toileting Toileting    Toileting assist Assist for toileting: Dependent - Patient 0%     Transfers Chair/bed transfer  Transfers assist     Chair/bed transfer assist level: 2 Helpers (stedy used)     Locomotion Ambulation   Ambulation assist      Assist level: Moderate Assistance - Patient 50 - 74% Assistive device: Walker-rolling (R hand splint, R AFO) Max distance: 36 ft   Walk 10 feet activity   Assist     Assist level: Moderate Assistance - Patient - 50 - 74% Assistive device: Walker-rolling   Walk 50 feet activity   Assist Walk 50 feet with 2 turns activity did not occur: Safety/medical concerns         Walk 150 feet activity   Assist Walk 150 feet activity did not occur: Safety/medical concerns         Walk 10 feet on uneven surface  activity   Assist Walk 10 feet on uneven surfaces activity did not occur: Safety/medical concerns         Wheelchair     Assist Is the patient using a wheelchair?: Yes Type of Wheelchair: Manual    Wheelchair assist level: Dependent - Patient 0% Max wheelchair distance: >150 ft    Wheelchair 50 feet with 2 turns activity    Assist        Assist Level: Dependent - Patient 0%   Wheelchair 150 feet activity     Assist      Assist Level: Dependent - Patient 0%   Blood pressure (!) 138/106, pulse (!) 120, temperature 99.8 F (37.7 C), temperature source Oral, resp. rate (!) 22, height 5' 4.17" (1.63 m), weight 66.3 kg, SpO2 100 %.  Medical Problem List and Plan: 1. 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  Antithrombotics: -DVT/anticoagulation:  Pharmaceutical: Other (comment) Eliquis             -antiplatelet therapy:  Aspirin 81 mg 3. Pain Management: Tylenol 4. Mood: LCSW to provide emotional support             -antipsychotic agents: n/a 5. Neuropsych: This patient is not capable of making decisions on her own behalf. 6. Skin/Wound Care: routine skin care checks 7. Fluids/Electrolytes/Nutrition: Is and Os and follow-up chemistries 8. Possible seizure activity on Keppra 9: Pyelonephritis/UTI/fever: culture + Klebsiella on Levaquin previously  -wbc's 26k last night, pending this morning, lactic acid pending             -  given temp spike and wbc's broadened covg to vanc/zosn.  -continue IVF for volume support  -given her appearance less likely aspiration event and more likely related to pyelo.   -re-check CT abdomen  -have contacted IM service. May need to go back to acute for closer monitoring.  10: Follow-up blood culture results; if positive, then TEE  12/24 blood cx negative x2 for 2 days 11: Hypertension: amlodipine. Permissive hypertension (OK if < 220/120) but gradually normalize in 5-7 days             -bp at "allowable" levels currently. Follow for trend 12:Atrial fibrillation: on eliquis  -EKG reviewed 139 with RVR  -5mg  iv lopressor now  -check topronin, ckmb 13: Hyperlipidemia: on atorvastatin. LDL = 61   Greater than 35 total minutes was spent in examination of patient, assessment of pertinent data,  formulation of a treatment plan, and in discussion with patient and/or family.    LOS: 2 days A FACE TO FACE EVALUATION WAS PERFORMED  Meredith Staggers 09/26/2021, 6:58 AM

## 2021-09-26 NOTE — Progress Notes (Signed)
ANTICOAGULATION CONSULT NOTE - Initial Consult  Pharmacy Consult for heparin Indication:  Renal artery thrombosis  No Known Allergies  Patient Measurements:   Heparin Dosing Weight: 66 kg   Vital Signs: Temp: 98.6 F (37 C) (12/25 1900) Temp Source: Oral (12/25 1900) BP: 133/88 (12/25 1900) Pulse Rate: 114 (12/25 1900)  Labs: Recent Labs    09/24/21 1234 09/25/21 2034 09/26/21 0741 09/26/21 1021 09/26/21 1951  HGB 11.3* 11.8* 13.0  --   --   HCT 33.2* 33.5* 38.8  --   --   PLT 433* 419* 377  --   --   CREATININE  --  1.61* QUANTITY NOT SUFFICIENT, UNABLE TO PERFORM TEST  --  1.69*  CKTOTAL  --   --  QUANTITY NOT SUFFICIENT, UNABLE TO PERFORM TEST  --   --   CKMB  --   --  QUANTITY NOT SUFFICIENT, UNABLE TO PERFORM TEST  --   --   TROPONINIHS  --   --   --  39*  --     Estimated Creatinine Clearance: 31.2 mL/min (A) (by C-G formula based on SCr of 1.69 mg/dL (H)).   Medical History: Past Medical History:  Diagnosis Date   Diverticulosis    Hypertension    Uterine prolapse     Medications:  Medications Prior to Admission  Medication Sig Dispense Refill Last Dose   acetaminophen (TYLENOL) 500 MG tablet Take 500 mg by mouth every 6 (six) hours as needed for moderate pain.      amLODipine (NORVASC) 5 MG tablet Take 1 tablet (5 mg total) by mouth daily. (Patient not taking: Reported on 09/19/2021) 30 tablet 3    apixaban (ELIQUIS) 5 MG TABS tablet Take 1 tablet (5 mg total) by mouth 2 (two) times daily. 180 tablet 0    aspirin EC 81 MG EC tablet Take 1 tablet (81 mg total) by mouth daily. Swallow whole. 30 tablet 0    atorvastatin (LIPITOR) 20 MG tablet Take 1 tablet (20 mg total) by mouth daily at 6 PM. 90 tablet 3    levETIRAcetam (KEPPRA) 500 MG tablet Take 1 tablet (500 mg total) by mouth every 12 (twelve) hours. 60 tablet 0    levofloxacin (LEVAQUIN) 750 MG tablet Take 1 tablet (750 mg total) by mouth daily for 27 days. 27 tablet 0    metoprolol tartrate  (LOPRESSOR) 50 MG tablet Take 1 tablet (50 mg total) by mouth 2 (two) times daily. 60 tablet 0    Multiple Vitamins-Minerals (MULTIVITAMIN ADULTS 50+) TABS Take 1 tablet by mouth daily.      ondansetron (ZOFRAN) 4 MG tablet Take 1 tablet (4 mg total) by mouth every 12 (twelve) hours as needed for nausea or vomiting. 20 tablet 0     Assessment: 65 YOF on Eliquis for a h/o AFib with RVR and new cerebellar strokes now switching to IV heparin for acute renal artery thrombosis. Of note, last dose of Eliquis was this AM.   H/H and Plt wnl. SCr elevated  Goal of Therapy:  Heparin level 0.3-0.5 units/ml aPTT 66-85 seconds Monitor platelets by anticoagulation protocol: Yes   Plan:  -Start heparin at 1000 units/hr now. No bolus in the setting of recent strokes  -F/u 6 hr aPTT and HL  -Monitor daily HL, aPTT, CBC and s/s of bleeding   Vinnie Level, PharmD., BCPS, BCCCP Clinical Pharmacist Please refer to [facility] for unit-specific pharmacist

## 2021-09-26 NOTE — Progress Notes (Signed)
EKG result in the paper chart. Unable to cross over into epic. MD Riley Kill notified of results. Awaiting further orders.

## 2021-09-26 NOTE — Progress Notes (Signed)
Pt transferred to [facility]

## 2021-09-26 NOTE — Progress Notes (Signed)
Notified MD of critical lab for lactic acid

## 2021-09-26 NOTE — H&P (Signed)
Date: 09/26/2021               Patient Name:  Lindsey [name] MRN: [id number]  DOB: Mar 19, 1956 Age / Sex: 65 y.o., female   PCP: Donnie Coffin, MD         Medical Service: Internal Medicine Teaching Service         Attending Physician: Dr. Jimmye Norman, Elaina Pattee, MD    First Contact: Dr. Alvie Heidelberg Pager: [phone]  Second Contact: Dr. 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   The patient was recently discharged from our service to CIR. Her previous admission Is summarized as follows: Patient presented with nausea, vomiting, diaphoresis, and slurred speech in the context of pre-existing neuro deficits. CT head was negative for new stroke and a code stroke was not called. CT A/P was notable for multifocal pyelonephritis of the right kidney, for which the patient was started on ceftriaxone. Of note, the patient's UA was negative and she had no leukocytosis or fever. The next day (12/19) the patient appeared much improved. She was no longer lethargic and produced spontaneous speech. The daughter reported that the patient was at her baseline level of functioning on that day. The next day (12/20) the patient exhibit more dysarthria and aphasia. An MRI-brain was obtained showing acute left AICA cerebellar infarct without hemorrhagic transformation as well as small left PICA infarcts, indicative of embolic strokes. This was thought to be most likely secondary to her afib (for which she takes Eliquis 5 mg BID).  Transthoracic echo was negative for thrombus, CT angiogram of the head and neck showed patent vasculature. She had a likely seizure that night and was therefore Keppra loaded and started on Keppra 500 mg BID going forward. EEG was negative for repeat seizure activity and there was no clinical recurrence noted throughout the rest of her stay. On assessment on 12/23 (day of discharge), the patient again exhibited improvement, with notably improved spontaneous speech, though she still had difficulty expressing herself.    An MRI-abdomen was performed, which showed a 3.5 cm abscess in the right kidney. ID was consulted and they recommended a 4 week course of Levaquin, which the patient was switched to. The patient had ID follow up with Dr. Baxter Flattery scheduled for January 16th.    The patient was discharged and transferred back to the IMTS service due to fevers, leukocytosis, and afib with RVR. Over the past 24 hrs the patient has had consistent fevers, with a Tmax of 102.5. Leukocytosis of 26. Blood cultures were obtained. Her Levaquin was discontinued and she was started on Vanc/Zosyn. She developed tachycardia to the 130's, for which she was given metop 5 mg IV. Recently her HR has been from 105 to 120. She has been receiving NS at 75 mL/hr. Lactate trending up from 2 to 2.6.  On assessment the patient is found in no acute distress. Her participation is minimal. Her grandson is at bedside and reports that the patient has told him she is in  no pain. Physical exam as below.      Meds:  Lopressor 50 mg BID Keppra 500 mg q12hrs Eliquis 5 mg BID Atorvastatin 20 mg Amlodipine 5 mg   Allergies: Allergies as of 09/26/2021   (No Known Allergies)   Past Medical History:  Diagnosis Date   Diverticulosis    Hypertension    Uterine prolapse     Family History:  Denies extensive FMHx of DM, stroke, CAD, cancer   Social History:  Lives with daughter (primary caregiver), grandson, and granddaughter. Walks  with a cane at home, can usually walk around the home. Able to eat food independently but is dependent on daughter to prepare food. Does not drink alcohol, smoke cigarettes, or use illicit drugs.  Review of Systems: Unable to complete due to poor engagement on interview.   Physical Exam: Physical Exam Vitals reviewed.  Constitutional:      Comments: Intermittently alert but often closes eyes and rests  Cardiovascular:     Rate and Rhythm: Tachycardia present. Rhythm irregular.     Heart sounds: No murmur heard. Pulmonary:     Effort: Pulmonary effort is normal.     Breath sounds: Normal breath sounds.  Abdominal:     General: Bowel sounds are normal.     Palpations: Abdomen is soft.     Tenderness: There is no abdominal tenderness.  Musculoskeletal:     Comments: No lower extremity edema, Hands and feet are cold  Skin:    Comments: Good central skin turgor  Neurological:     Comments: Engages enough to test grip strength in her left upper extremity, which is fair, but closes her eyes and does not participate for the rest of the exam.    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 Patient has a known source of infection (her previously found pyelonephritis). Her previous urine culture grew Klebsiella resistant to ampicillin, Bactrim, and Macrobid. Was previously treated for a total of 7 days with Rocephin, Cefazolin, and Levaquin,  in that order. CXR was unremarkable for any focal consolidation that might suggest pneumonia. Urine and blood cultures have been collected and are pending. I am concerned for progression of her pyelonephritis. A CT A/P is ordered. Her afib with RVR is likely a response to volume depletion/infection. Currently her HR is acceptable from 105-120.  -ID consult, appreciate recs -Continue Vanc/Zosyn for now -Increase IVF when able to 125 mL/hr for 12 hrs, given lactic acidosis -F/u CT A/P -F/u Ucx and BCx  -Continue Lopressor 50 mg BID  #AKI Cr from 0.5-0.8 to 1.6 yesterday. Likely pre-renal etiology in the setting of the above. -IVF as above -Avoid nephrotoxins  #Hx L MCA stroke with R sided hemiplegia and aphasia #New AICA cerebellar stroke and PICA infarcts #Potential seizure -Continue Keppra 500 mg q12hrs -Continue Eliquis 5 mg BID -Continue ASA 81 mg daily -Atorvastatin 20 mg daily  #Hyperlipidemia -Atorvastatin 20 mg  #HTN -Amlodipine 5 mg  Dispo: Admit patient to Inpatient with expected length  of stay greater than 2 midnights.  Signed: Corky Sox, MD PGY-1 Pager: [phone] After 5pm on weekdays and 1pm on weekends: On Call pager: [phone]

## 2021-09-26 NOTE — Significant Event (Signed)
Rapid Response Event Note   Reason for Call :  Decreased LOC.  Initial Focused Assessment:  Pt lying in bed with eyes closed. She will respond to voice, answer some questions(but only intermittently), and will move L side to command. Pt will withdraw to pain on R leg to pain but does not move R arm(this is baseline from previous stroke). Skin hot to touch.  T-101.3(R), HR-122, SBP-140s, RR-18, SpO2-98% on RA.    Interventions:  CBG-111 Tylenol for fever Plan of Care:  Tx fever and monitor response. Continue to monitor closely. Call RRT if further assistance needed.  Event Summary:   MD Notified: Dr. Kirke Corin and Alroy Bailiff notified and came to beside.  Call Time:2226 Arrival Time:2228 End [id number]  Terrilyn Saver, RN

## 2021-09-26 NOTE — Progress Notes (Signed)
Inpatient Rehabilitation Discharge Medication Review by a Pharmacist  A complete drug regimen review was completed for this patient to identify any potential clinically significant medication issues.  High Risk Drug Classes Is patient taking? Indication by Medication  Antipsychotic Yes Compazine- N/V  Anticoagulant Yes Apixaban- A. fib  Antibiotic Yes Levaquin changed to vancomycin/Zosyn - multifocal pyelonephritis  Opioid No   Antiplatelet Yes Aspirin- CVA prophylaxis  Hypoglycemics/insulin No   Vasoactive Medication Yes Norvasc, lopressor- hypertension  Chemotherapy No   Other Yes Trazodone- sleep Lipitor- HLD Keppra- seizure prophylaxis s/p CVA Senna/Miralax- bowel regimen     Type of Medication Issue Identified Description of Issue Recommendation(s)  Drug Interaction(s) (clinically significant)     Duplicate Therapy     Allergy     No Medication Administration End Date     Incorrect Dose     Additional Drug Therapy Needed     Significant med changes from prior encounter (inform family/care partners about these prior to discharge).    Other       Clinically significant medication issues were identified that warrant physician communication and completion of prescribed/recommended actions by midnight of the next day:  No  Pharmacist notes: physician discharge medication reconciliation and discharge summary pending   On Levaquin for Klebsiella pyelonephritis with renal abscess. Fever, rise in WBC, Afib with RVR noted and antibiotics broadened to vancomycin/Zosyn. Amlodipine held. Patient readmitted to inpatient for further work-up.   Time spent performing this drug regimen review (minutes):  30  Loura Back, PharmD, BCPS Clinical Pharmacist Clinical phone for 09/26/2021 until 10p is [phone] 09/26/2021 3:41 PM  **Pharmacist phone directory can be found on [email or web address] listed under [facility]**

## 2021-09-26 NOTE — Progress Notes (Signed)
Phlebotomy contacted x2. Awaiting blood draw

## 2021-09-26 NOTE — Progress Notes (Signed)
[facility]    Date of Admission:  09/24/2021   Total days of antibiotics 8/day 3 of levo         RFC: sepsis  ID: Elisabet Gutzmer is a 65 y.o. 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 Initially admitted on 12/19 for AMS, nausea and vomiting, fevers found to have klebsiella pyelonephritis with renal abscess, she was discharged to rehab on 12/23, abtx changed to levofloxacin with plan to treat for 3 wk with repeat imaging. While at rehab, started to have low grade fever on 12/24 with temp 100.4 but WBC increased from 5K to 26K from the day prior. Her abtx changed to vanco and piptazo. This morning still having high temp of 102.5, HR in 130s and complaining of abdominal pain and intermittent nausea and vomiting. Plain films of chest and abdomen are unrevealing. Labs also showed WBC of 30K, Lactic acid of 2.6. She was transferred back to inpatient for further management Subjective: Patient remains comfortable, sleeping mostly, arousable without distress  Medications:   amLODipine  5 mg Oral Daily   apixaban  5 mg Oral BID   aspirin EC  81 mg Oral Daily   atorvastatin  20 mg Oral q1800   levETIRAcetam  500 mg Oral Q12H   metoprolol tartrate  50 mg Oral BID   senna  1 tablet Oral BID   vancomycin variable dose per unstable renal function (pharmacist dosing)   Does not apply See admin instructions    Objective: Vital signs in last 24 hours: Temp:  [98.9 F (37.2 C)-102.5 F (39.2 C)] 100.3 F (37.9 C) (12/25 0956) Pulse Rate:  [87-120] 87 (12/25 0956) Resp:  [16-22] 16 (12/25 0848) BP: (128-147)/(83-106) 128/97 (12/25 0956) SpO2:  [99 %-100 %] 100 % (12/25 0956)  Physical Exam  Constitutional:  oriented to person, only. appears well-developed and well-nourished. No distress.   HENT: [place]/AT, PERRLA, no scleral icterus Mouth/Throat: Oropharynx is clear and moist. No oropharyngeal exudate.  Cardiovascular: Normal rate, regular rhythm and normal heart sounds. Exam reveals no gallop and no friction rub.  No murmur heard.  Pulmonary/Chest: Effort normal and breath sounds normal. No respiratory distress.  has no wheezes.  Neck = supple, no nuchal rigidity Abdominal: Soft. Bowel sounds are normal.  exhibits no distension. There is no tenderness.  Lymphadenopathy: no cervical adenopathy. No axillary adenopathy Neurological: spontaneously moving left arm and leg Skin: Skin is warm and dry. No rash noted. No erythema.  Psychiatric: a normal mood and affect.  behavior is normal.    Lab Results Recent Labs    09/24/21 0203 09/24/21 1234 09/25/21 2034 09/26/21 0741  WBC 4.4   < > 26.6* 30.0*  HGB 11.2*   < > 11.8* 13.0  HCT 33.5*   < > 33.5* 38.8  NA 135  --  132*  --   K 3.5  --  4.1  --   CL 103  --  97*  --   CO2 24  --  21*  --   BUN 9  --  15  --   CREATININE 0.79  --  1.61*  --    < > = values in this interval not displayed.   Liver Panel No results for input(s): PROT, ALBUMIN, AST, ALT, ALKPHOS, BILITOT, BILIDIR,  IBILI in the last 72 hours. Sedimentation Rate No results for input(s): ESRSEDRATE in the last 72 hours. C-Reactive Protein No results for input(s): CRP in the last 72 hours.  Microbiology: Blood cx 12/25 pending Urine cx 12/24 pending Blood cx 12/22 NGTD Urine cx 12/18 kleb pneumonaie Studies/Results: DG Abd 1 View  Result Date: 09/24/2021 CLINICAL DATA:  Abdominal pain, vomiting EXAM: ABDOMEN - 1 VIEW COMPARISON:  None. FINDINGS: The bowel gas pattern is normal. No radio-opaque calculi or other significant radiographic abnormality are seen. IMPRESSION: Nonobstructive pattern of bowel gas. No obvious free air in the abdomen on single supine radiograph. Electronically Signed   By: Delanna Ahmadi M.D.   On: 09/24/2021 14:20   DG CHEST PORT  1 VIEW  Result Date: 09/25/2021 CLINICAL DATA:  Shortness of breath. EXAM: PORTABLE CHEST 1 VIEW COMPARISON:  Chest radiograph dated 09/19/2021. FINDINGS: Left lung base reticular densities, likely atelectasis. Developing infiltrate is less likely. No focal consolidation, pleural effusion, pneumothorax. Mild cardiomegaly. Atherosclerotic calcification of the aorta. No acute osseous pathology. IMPRESSION: Left lung base atelectasis. Developing infiltrate is less likely. Electronically Signed   By: Anner Crete M.D.   On: 09/25/2021 20:22     Assessment/Plan: Sepsis -currently being treated for klebsiella pyelonephritis = continue on broad spectrum with piptazo and vancomycin until next 48hrs to see if new findings from either repeat blood cx or repeat abd/pelvis CT, whether renal abscess has enlarged where it would need IR drainage.  Leukocytosis = does not have diarrhea, no abdominal discomfort to suggest colitis. Will await abd/pelvis CT. Continue to follow  Fever = can give acetominophen if it can make patient more comfortable.  [facility] Pager: [phone]  09/26/2021, 12:39 PM

## 2021-09-26 NOTE — Progress Notes (Addendum)
Received page from RN that pt was drowsy and responsive only to painful stimuli. Pt evaluated at bedside. Patient is drowsy-appearing, turns head to voice with only intermittent responses to questions. Pt tachycardic ranging in the 110s-150s at bedside, in Afib with RVR. Rectal temp of approximately 101.3. Pt with only one IV, so IV team called to place second IV. Started dilt drip and given rectal tylenol for fever. Will continue to monitor.

## 2021-09-27 ENCOUNTER — Inpatient Hospital Stay (HOSPITAL_COMMUNITY): Payer: Medicare Other

## 2021-09-27 DIAGNOSIS — N280 Ischemia and infarction of kidney: Secondary | ICD-10-CM

## 2021-09-27 DIAGNOSIS — N12 Tubulo-interstitial nephritis, not specified as acute or chronic: Principal | ICD-10-CM

## 2021-09-27 DIAGNOSIS — I4891 Unspecified atrial fibrillation: Secondary | ICD-10-CM

## 2021-09-27 LAB — COMPREHENSIVE METABOLIC PANEL
ALT: 32 U/L (ref 0–44)
AST: 71 U/L — ABNORMAL HIGH (ref 15–41)
Albumin: 2.4 g/dL — ABNORMAL LOW (ref 3.5–5.0)
Alkaline Phosphatase: 75 U/L (ref 38–126)
Anion gap: 11 (ref 5–15)
BUN: 17 mg/dL (ref 8–23)
CO2: 23 mmol/L (ref 22–32)
Calcium: 8.9 mg/dL (ref 8.9–10.3)
Chloride: 102 mmol/L (ref 98–111)
Creatinine, Ser: 1.74 mg/dL — ABNORMAL HIGH (ref 0.44–1.00)
GFR, Estimated: 32 mL/min — ABNORMAL LOW (ref >60)
Glucose, Bld: 101 mg/dL — ABNORMAL HIGH (ref 70–99)
Potassium: 3.8 mmol/L (ref 3.5–5.1)
Sodium: 136 mmol/L (ref 135–145)
Total Bilirubin: 1.1 mg/dL (ref 0.3–1.2)
Total Protein: 7.7 g/dL (ref 6.5–8.1)

## 2021-09-27 LAB — CBC
HCT: 29.3 % — ABNORMAL LOW (ref 36.0–46.0)
Hemoglobin: 10.1 g/dL — ABNORMAL LOW (ref 12.0–15.0)
MCH: 29.4 pg (ref 26.0–34.0)
MCHC: 34.5 g/dL (ref 30.0–36.0)
MCV: 85.4 fL (ref 80.0–100.0)
Platelets: 302 10*3/uL (ref 150–400)
RBC: 3.43 MIL/uL — ABNORMAL LOW (ref 3.87–5.11)
RDW: 15.2 % (ref 11.5–15.5)
WBC: 26.3 10*3/uL — ABNORMAL HIGH (ref 4.0–10.5)
nRBC: 0 % (ref 0.0–0.2)

## 2021-09-27 LAB — URINE CULTURE: Culture: NO GROWTH

## 2021-09-27 LAB — URINALYSIS, ROUTINE W REFLEX MICROSCOPIC
Bilirubin Urine: NEGATIVE
Glucose, UA: NEGATIVE mg/dL
Ketones, ur: NEGATIVE mg/dL
Leukocytes,Ua: NEGATIVE
Nitrite: NEGATIVE
Protein, ur: 100 mg/dL — AB
Specific Gravity, Urine: 1.025 (ref 1.005–1.030)
pH: 6 (ref 5.0–8.0)

## 2021-09-27 LAB — URINALYSIS, MICROSCOPIC (REFLEX): RBC / HPF: >50 RBC/hpf (ref 0–5)

## 2021-09-27 LAB — APTT
aPTT: 92 seconds — ABNORMAL HIGH (ref 24–36)
aPTT: 50 seconds — ABNORMAL HIGH (ref 24–36)

## 2021-09-27 LAB — ECHOCARDIOGRAM LIMITED BUBBLE STUDY: S' Lateral: 2.9 cm

## 2021-09-27 LAB — PROTIME-INR
INR: 2.2 — ABNORMAL HIGH (ref 0.8–1.2)
Prothrombin Time: 24.7 seconds — ABNORMAL HIGH (ref 11.4–15.2)

## 2021-09-27 LAB — LACTIC ACID, PLASMA: Lactic Acid, Venous: 2 mmol/L (ref 0.5–1.9)

## 2021-09-27 LAB — HEPARIN LEVEL (UNFRACTIONATED): Heparin Unfractionated: >1.1 IU/mL — ABNORMAL HIGH (ref 0.30–0.70)

## 2021-09-27 MED ORDER — LACTATED RINGERS IV SOLN: INTRAVENOUS | Status: AC

## 2021-09-27 MED ORDER — CEFAZOLIN SODIUM-DEXTROSE 2-4 GM/100ML-% IV SOLN
2.0000 g | Freq: Three times a day (TID) | INTRAVENOUS | Status: DC
  Administered 2021-09-27 – 2021-10-01 (×12): 2 g via INTRAVENOUS
  Filled 2021-09-27 (×14): qty 100

## 2021-09-27 NOTE — TOC Progression Note (Signed)
Transition of Care [facility]) - Progression Note    Patient Details  Name: Lindsey [name] MRN: [id number] Date of Birth: Feb 29, 1956  Transition of Care [facility]) CM/SW Contact  Leone Haven, RN Phone Number: 09/27/2021, 4:08 PM  Clinical Narrative:     Transition of Care [facility]) Screening Note   Patient Details  Name: Lindsey [name] Date of Birth: 06-02-1956   Transition of Care [facility]) CM/SW Contact:    Leone Haven, RN Phone Number: 09/27/2021, 4:08 PM    Transition of Care Department [facility]) has reviewed patient and no TOC needs have been identified at this time. We will continue to monitor patient advancement through interdisciplinary progression rounds. If new patient transition needs arise, please place a TOC consult.          Expected Discharge Plan and Services                                                 Social Determinants of Health (SDOH) Interventions    Readmission Risk Interventions No flowsheet data found.

## 2021-09-27 NOTE — Progress Notes (Signed)
As per phlebotomist 2 techs tried to draw blood for heparin level was unsuccessful. MD aware who claimed to coordinate with pharmacy  about the heparin level.

## 2021-09-27 NOTE — Progress Notes (Signed)
Inpatient Rehab Admissions Coordinator:   Note back to acute for medical issues.  Will follow for therapy evaluations.   Estill Dooms, PT, DPT Admissions Coordinator [phone] 09/27/21  9:20 AM

## 2021-09-27 NOTE — Progress Notes (Signed)
Inpatient Rehabilitation  Patient information reviewed and entered into eRehab system by Jalyric Kaestner M. Fredick Schlosser, M.A., CCC/SLP, PPS Coordinator.  Information including medical coding, functional ability and quality indicators will be reviewed and updated through discharge.    

## 2021-09-27 NOTE — Progress Notes (Signed)
Attempted to feed during lunch but refused took 2 cups of orange juice , tolerated well.

## 2021-09-27 NOTE — Progress Notes (Signed)
ANTICOAGULATION CONSULT NOTE - Follow Up Consult  Pharmacy Consult for IV Heparin Indication:  Renal artery thrombosis  No Known Allergies  Patient Measurements: Heparin Dosing Weight: 66 kg   Vital Signs: Temp: 98.8 F (37.1 C) (12/26 1933) Temp Source: Oral (12/26 1933) BP: 126/75 (12/26 1933) Pulse Rate: 94 (12/26 1933)  Labs: Recent Labs    09/25/21 2034 09/26/21 0741 09/26/21 1021 09/26/21 1951 09/27/21 0837 09/27/21 2008  HGB 11.8* 13.0  --   --  10.1*  --   HCT 33.5* 38.8  --   --  29.3*  --   PLT 419* 377  --   --  302  --   APTT  --   --   --   --  92* 50*  LABPROT  --   --   --   --  24.7*  --   INR  --   --   --   --  2.2*  --   HEPARINUNFRC  --   --   --   --  >1.10*  --   CREATININE 1.61* QUANTITY NOT SUFFICIENT, UNABLE TO PERFORM TEST  --  1.69* 1.74*  --   CKTOTAL  --  QUANTITY NOT SUFFICIENT, UNABLE TO PERFORM TEST  --   --   --   --   CKMB  --  QUANTITY NOT SUFFICIENT, UNABLE TO PERFORM TEST  --   --   --   --   TROPONINIHS  --   --  39*  --   --   --     Estimated Creatinine Clearance: 30.3 mL/min (A) (by C-G formula based on SCr of 1.74 mg/dL (H)).   Medical History: Past Medical History:  Diagnosis Date   Diverticulosis    Hypertension    Uterine prolapse     Assessment: 65 yr old woman on apixaban for hx of atrial fibrillation with RVR and new cerebellar strokes, now switching to IV heparin for acute renal artery thrombosis. Of note, last dose of apixaban was 12/25 AM.   Hgb down slightly to 10.1. SCr elevated and trending up. aPTT ~10 hrs after heparin infusion was reduced to 900 units/hr was 50 sec, which is below the goal range for this pt. Per RN, no issues with IV or bleeding observed.  Goal of Therapy:  Heparin level 0.3-0.5 units/ml aPTT 66-85 seconds Monitor platelets by anticoagulation protocol: Yes   Plan:  Increase heparin infusion to 950 units/hr (pt had supratherapeutic aPTT on heparin infusion of 1000 units/hr and Scr is  trending up) No bolus in the setting of recent strokes  Check 6-hr aPTT, heparin level Monitor daily aPTT, heparin level, CBC Monitor for bleeding  Thank you for allowing [place] to participate in this patients care.  Vicki Mallet, PharmD, BCPS, [facility] Clinical Pharmacist 09/27/2021 9:21 PM

## 2021-09-27 NOTE — Progress Notes (Signed)
ANTICOAGULATION CONSULT NOTE  Pharmacy Consult for heparin Indication:  Renal artery thrombosis  No Known Allergies  Patient Measurements:   Heparin Dosing Weight: 66 kg   Vital Signs: Temp: 97.9 F (36.6 C) (12/26 0800) Temp Source: Oral (12/26 0800) BP: 130/82 (12/26 0800) Pulse Rate: 90 (12/26 0800)  Labs: Recent Labs    09/25/21 2034 09/26/21 0741 09/26/21 1021 09/26/21 1951 09/27/21 0837  HGB 11.8* 13.0  --   --  10.1*  HCT 33.5* 38.8  --   --  29.3*  PLT 419* 377  --   --  302  APTT  --   --   --   --  92*  LABPROT  --   --   --   --  24.7*  INR  --   --   --   --  2.2*  HEPARINUNFRC  --   --   --   --  >1.10*  CREATININE 1.61* QUANTITY NOT SUFFICIENT, UNABLE TO PERFORM TEST  --  1.69*  --   CKTOTAL  --  QUANTITY NOT SUFFICIENT, UNABLE TO PERFORM TEST  --   --   --   CKMB  --  QUANTITY NOT SUFFICIENT, UNABLE TO PERFORM TEST  --   --   --   TROPONINIHS  --   --  39*  --   --      Estimated Creatinine Clearance: 31.2 mL/min (A) (by C-G formula based on SCr of 1.69 mg/dL (H)).   Medical History: Past Medical History:  Diagnosis Date   Diverticulosis    Hypertension    Uterine prolapse     Medications:  Medications Prior to Admission  Medication Sig Dispense Refill Last Dose   acetaminophen (TYLENOL) 500 MG tablet Take 500 mg by mouth every 6 (six) hours as needed for moderate pain.      amLODipine (NORVASC) 5 MG tablet Take 1 tablet (5 mg total) by mouth daily. (Patient not taking: Reported on 09/19/2021) 30 tablet 3    apixaban (ELIQUIS) 5 MG TABS tablet Take 1 tablet (5 mg total) by mouth 2 (two) times daily. 180 tablet 0    aspirin EC 81 MG EC tablet Take 1 tablet (81 mg total) by mouth daily. Swallow whole. 30 tablet 0    atorvastatin (LIPITOR) 20 MG tablet Take 1 tablet (20 mg total) by mouth daily at 6 PM. 90 tablet 3    levETIRAcetam (KEPPRA) 500 MG tablet Take 1 tablet (500 mg total) by mouth every 12 (twelve) hours. 60 tablet 0    levofloxacin  (LEVAQUIN) 750 MG tablet Take 1 tablet (750 mg total) by mouth daily for 27 days. 27 tablet 0    metoprolol tartrate (LOPRESSOR) 50 MG tablet Take 1 tablet (50 mg total) by mouth 2 (two) times daily. 60 tablet 0    Multiple Vitamins-Minerals (MULTIVITAMIN ADULTS 50+) TABS Take 1 tablet by mouth daily.      ondansetron (ZOFRAN) 4 MG tablet Take 1 tablet (4 mg total) by mouth every 12 (twelve) hours as needed for nausea or vomiting. 20 tablet 0     Assessment: 60 YOF on Eliquis for a h/o AFib with RVR and new cerebellar strokes now switching to IV heparin for acute renal artery thrombosis. Of note, last dose of Eliquis was 12/25 AM.   Hgb down slightly to 10.1. SCr elevated aPTT 92 this morning, slightly Supra-therapeutic for our low goal. No bleeding noted.  Goal of Therapy:  Heparin level 0.3-0.5 units/ml  aPTT 66-85 seconds Monitor platelets by anticoagulation protocol: Yes   Plan:  -Reduce heparin to 900 units/hr. No bolus in the setting of recent strokes  -F/u 6 hr aPTT and HL  -Monitor daily HL, aPTT, CBC and s/s of bleeding    Thank you for allowing [place] to participate in this patients care. Signe Colt, PharmD 09/27/2021 9:20 AM  **Pharmacist phone directory can be found on [email or web address] listed under [facility]**

## 2021-09-27 NOTE — Plan of Care (Signed)
°  Problem: Clinical Measurements: Goal: Respiratory complications will improve Outcome: Progressing Goal: Cardiovascular complication will be avoided Outcome: Progressing   Problem: Coping: Goal: Level of anxiety will decrease Outcome: Progressing   Problem: Elimination: Goal: Will not experience complications related to urinary retention Outcome: Progressing   Problem: Pain Managment: Goal: General experience of comfort will improve Outcome: Progressing   Problem: Safety: Goal: Ability to remain free from injury will improve Outcome: Progressing   Problem: Skin Integrity: Goal: Risk for impaired skin integrity will decrease Outcome: Progressing

## 2021-09-27 NOTE — Progress Notes (Addendum)
Heart rate  >120 afib. Cardizem  gtt increased to 10 ml /hr. Continue to monitor.

## 2021-09-27 NOTE — Progress Notes (Addendum)
[facility]    Date of Admission:  09/26/2021   Total days of antibiotics 9          ID: Lindsey [name] is a 65 y.o. female with hx of L-MCA stroke,right hemiplegia and aphasia initially hospitalized for klebsiella pyelonephritis with renal abscess decompensated while in rehab with fever, tachycardia, leukocytosis x 36hrs, and escalated Principal Problem:   Pyelonephritis    Subjective: Patient underwent abd/pelvis CT last night found to have renal artery infarct which could explain her presentation over the last 36-48hr. We will narrow abtx back to cefazolin since no new source of infection found. Renal abscess also slightly decreased on imaging. She was started on heparin.now afebrile. Still solomnent. Easily arousable  Medications:   aspirin EC  81 mg Oral Daily   atorvastatin  20 mg Oral q1800    Objective: Vital signs in last 24 hours: Temp:  [97.7 F (36.5 C)-99 F (37.2 C)] 97.9 F (36.6 C) (12/26 0800) Pulse Rate:  [90-164] 90 (12/26 0800) Resp:  [18-23] 19 (12/26 1202) BP: (110-153)/(62-99) 130/82 (12/26 0800) SpO2:  [94 %-100 %] 96 % (12/26 1202) Physical Exam  Constitutional:  oriented to person, only. Easily arousable appears well-developed and well-nourished. No distress.  HENT: [place]/AT, PERRLA, no scleral icterus Mouth/Throat: Oropharynx is clear and moist. No oropharyngeal exudate.  Cardiovascular: Normal rate, regular rhythm and normal heart sounds. Exam reveals no gallop and no friction rub.  No murmur heard.  Pulmonary/Chest: Effort normal and breath sounds normal. No respiratory distress.  has no wheezes.  Neck = supple, no nuchal rigidity Abdominal: Soft. Bowel sounds are normal.  exhibits no distension. There is no tenderness.  Lymphadenopathy: no cervical adenopathy. No axillary adenopathy Neurological: alert and oriented to person, place, and time. Moves right arm and leg spontaneously Skin: Skin is warm and dry. No rash  noted. No erythema.  Psychiatric: a normal mood and affect.  behavior is normal.    Lab Results Recent Labs    09/26/21 0741 09/26/21 1951 09/27/21 0837  WBC 30.0*  --  26.3*  HGB 13.0  --  10.1*  HCT 38.8  --  29.3*  NA QUANTITY NOT SUFFICIENT, UNABLE TO PERFORM TEST 133* 136  K QUANTITY NOT SUFFICIENT, UNABLE TO PERFORM TEST 4.2 3.8  CL QUANTITY NOT SUFFICIENT, UNABLE TO PERFORM TEST 105 102  CO2 QUANTITY NOT SUFFICIENT, UNABLE TO PERFORM TEST 18* 23  BUN QUANTITY NOT SUFFICIENT, UNABLE TO PERFORM TEST 17 17  CREATININE QUANTITY NOT SUFFICIENT, UNABLE TO PERFORM TEST 1.69* 1.74*   Liver Panel Recent Labs    09/27/21 0837  PROT 7.7  ALBUMIN 2.4*  AST 71*  ALT 32  ALKPHOS 75  BILITOT 1.1   Sedimentation Rate No results for input(s): ESRSEDRATE in the last 72 hours. C-Reactive Protein No results for input(s): CRP in the last 72 hours.  Microbiology:  Studies/Results: CT ABDOMEN PELVIS W CONTRAST  Result Date: 09/26/2021 CLINICAL DATA:  Nausea vomiting pyelonephritis EXAM: CT ABDOMEN AND PELVIS WITH CONTRAST TECHNIQUE: Multidetector CT imaging of the abdomen and pelvis was performed using the standard protocol following bolus administration of intravenous contrast. CONTRAST:  85mL OMNIPAQUE IOHEXOL 300 MG/ML  SOLN COMPARISON:  MRI 09/19/2021, CT 09/19/2021 FINDINGS: Lower chest: Lung bases demonstrate patchy atelectasis at the bases. No pleural effusion. Cardiomegaly with biatrial enlargement. Hepatobiliary: Multiple gallbladder folds. No calcified stones. No focal hepatic abnormality. No biliary dilatation. Pancreas: Unremarkable. No pancreatic ductal dilatation or surrounding inflammatory changes. Spleen: Normal  in size without focal abnormality. Adrenals/Urinary Tract: Adrenal glands are within normal limits. Multifocal hypoenhancement within the right kidney, greatest involving the upper and midpole, consistent with pyelonephritis. More focal hypodense collection at the  upper pole measuring 3.1 by 2.7 cm, suspect for phlegmon or renal abscess, this is mildly decreased from the prior CT at which time the seen area measures 3.4 x 3.3 cm. There are several cysts in the left kidney. Interim development of markedly abnormal appearance of left kidney with multifocal hypoenhancement involving large portion of the upper and midpole of left kidney with relative sparing of the lower pole. Some peripheral cortical enhancement is preserved at the midpole of the left kidney. Left perinephric fat stranding. No hydronephrosis or hydroureter. The bladder is unremarkable Stomach/Bowel: The stomach is nonenlarged. No dilated small bowel. No acute bowel wall thickening. Negative appendix. Vascular/Lymphatic: Nonaneurysmal aorta with mild atherosclerosis. New thrombus is present within the left main renal artery, series 3 image 32, and coronal series 6 image 36 through 40 on coronal views this is approximately 10 mm distal to the renal artery origin, thrombus extends to upper pole segmental vessels of the kidney. Renal veins appear patent. No suspicious adenopathy. Reproductive: Calcified uterine fibroid.  No adnexal mass. Other: Negative for pelvic effusion or free air. Musculoskeletal: No acute or significant osseous findings. IMPRESSION: 1. Interim development of markedly abnormal appearance of the left kidney with extensive areas of hypoenhancement, relative sparing of the lower pole. New finding of thrombus within the left renal artery. Suspect that the hypoenhancement in the left kidney is largely due to renal infarct given the presence of thrombus in the artery, though there may be associated areas of pyelonephritis as well. 2. Residual patchy hypoenhancement at the right kidney, consistent with pyelonephritis. More focal hypodense collection at the upper pole right kidney is slightly smaller compared to the prior CT and may reflect phlegmon or resolving abscess (favored), with infarct also in  the differential. 3. Calcified uterine fibroid Critical Value/emergent results were called by telephone at the time of interpretation on 09/26/2021 at 6:46 pm to provider Dr. Cicero Duck, Who verbally acknowledged these results. Electronically Signed   By: Donavan Foil M.D.   On: 09/26/2021 18:48   DG CHEST PORT 1 VIEW  Result Date: 09/25/2021 CLINICAL DATA:  Shortness of breath. EXAM: PORTABLE CHEST 1 VIEW COMPARISON:  Chest radiograph dated 09/19/2021. FINDINGS: Left lung base reticular densities, likely atelectasis. Developing infiltrate is less likely. No focal consolidation, pleural effusion, pneumothorax. Mild cardiomegaly. Atherosclerotic calcification of the aorta. No acute osseous pathology. IMPRESSION: Left lung base atelectasis. Developing infiltrate is less likely. Electronically Signed   By: Anner Crete M.D.   On: 09/25/2021 20:22     Assessment/Plan: Klebsiella renal abscess = will d/c vanco and piptazo -- narrow to cefazolin for kleb renal abscess. Initial plan to treat for 3 wk. Repeat blood cx from 12/25 are pending. Continue on iv while hospitalized  Leukocytosis = likely from response to renal infarct. Anticipate to trend down.  Renal infarct= currently on heparin gtt to see if it will help decrease damage from renal artery thrombus  Afib = currently on diltiazem gtt and heparin gtt.  [facility] Pager: [phone]  09/27/2021, 2:08 PM

## 2021-09-27 NOTE — Progress Notes (Signed)
°   09/26/21 2237  Assess: MEWS Score  Pulse Rate (!) 121  ECG Heart Rate (!) 131  Resp (!) 23  SpO2 97 %  Assess: MEWS Score  MEWS Temp 0  MEWS Systolic 0  MEWS Pulse 3  MEWS RR 1  MEWS LOC 1  MEWS Score 5  MEWS Score Color Red  Assess: if the MEWS score is Yellow or Red  Were vital signs taken at a resting state? Yes  Focused Assessment Change from prior assessment (see assessment flowsheet)  Early Detection of Sepsis Score *See Row Information* High  MEWS guidelines implemented *See Row Information* Yes  Notify: Charge Nurse/RN  Name of Charge Nurse/RN Notified Luna Kitchens  Date Charge Nurse/RN Notified 09/26/21  Time Charge Nurse/RN Notified 2210 (at Bedside)  Notify: Provider  Provider Name/Title Julieanne Manson, MD  Date Provider Notified 09/26/21  Time Provider Notified 2235  Notification Type Page  Notification Reason Change in status  Provider response At bedside;En route (Called and at bedside)  Date of Provider Response 09/26/21  Time of Provider Response 2237  Notify: Rapid Response  Name of Rapid Response RN Notified Mindy  Date Rapid Response Notified 09/26/21  Time Rapid Response Notified 2226   MD made aware, will continue to monitor. Dahlia Client, RN

## 2021-09-27 NOTE — Discharge Summary (Deleted)
  The note originally documented on this encounter has been moved the the encounter in which it belongs.  

## 2021-09-27 NOTE — Discharge Summary (Addendum)
Physician Discharge Summary  Patient ID: Nishat Compres MRN: [id number] DOB/AGE: 09-Nov-1955 65 y.o.  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 Multiplanar CT image reconstructions and MIPs were obtained to evaluate the vascular anatomy. Carotid stenosis measurements (when applicable) are obtained utilizing NASCET criteria, using the distal internal carotid diameter as the denominator. CONTRAST:  18mL OMNIPAQUE IOHEXOL 350 MG/ML SOLN COMPARISON:  MR head obtained earlier the same day, noncontrast CT head 09/19/2021 FINDINGS: CT HEAD FINDINGS Brain: Is hypodensity in the left cerebellar hemisphere consistent with evolving infarct in the SCA distribution. Petechial hemorrhage seen on the prior brain MRI is not well appreciated on the current study. There is no evidence of hematoma formation. There is no significant regional mass effect, and the fourth ventricle remains patent The remote infarct in the left MCA distribution and associated wallerian degeneration extending into the brainstem are again seen. There is unchanged ex vacuo dilatation of the left lateral ventricle. The ventricles are otherwise normal in size. A remote infarct in the left PICA distribution and additional remote infarct in the left occipital lobe are unchanged. Vascular: See below. Skull: Normal. Negative for fracture or focal  lesion. Sinuses and orbits: There is mucosal thickening and partial opacification of posterior left and anterior right ethmoid air cells. The globes and orbits are unremarkable. Other: None. Review of the MIP images confirms the above findings CTA NECK FINDINGS Aortic arch: Is minimal calcified atherosclerotic plaque in the thoracic aorta. The origins of the major branch vessels are patent. There is no evidence of dissection to the level imaged. Right carotid system: There is a medialized course of the right common and proximal internal carotid artery. There is minimal calcified atherosclerotic plaque at the right carotid bulb without hemodynamically significant stenosis or occlusion. There is no dissection or aneurysm. Left carotid system: There is a medialized course of the left common and proximal internal carotid artery. There is primarily calcified atherosclerotic plaque at the left carotid bulb resulting in less than 50% stenosis. There is no dissection or aneurysm. Vertebral arteries: The left vertebral artery is dominant, a normal variant. The vertebral arteries are patent, without hemodynamically significant stenosis, occlusion, dissection, or aneurysm. Skeleton: There is minimal degenerative change of the cervical spine. There is no visible canal hematoma. There is no acute osseous abnormality or aggressive osseous lesion. Other neck: A few prominent retropectoral lymph nodes bilaterally are not significantly changed since 04/21/2020, nonspecific. The soft tissues are otherwise unremarkable. Upper chest: The imaged lung apices are clear. Review of the MIP images confirms the above findings CTA HEAD FINDINGS Anterior circulation: There is minimal calcified atherosclerotic plaque in the intracranial ICAs without significant stenosis or occlusion. The bilateral MCAs are patent. The bilateral ACAs are patent. The anterior communicating artery is patent. There is no aneurysm. Posterior circulation: The  bilateral V4 segments are patent, left larger than right. PICA origins are patent bilaterally. The superior cerebellar arteries are patent proximally bilaterally. There is focal stenosis and near occlusion of the left SCA (12-135), but the ICA appears  patent distal to this point. The basilar artery is patent The bilateral PCAs are patent. A posterior communicating artery is identified on the right. The left PCOM is not definitely seen. There is no aneurysm. Venous sinuses: Not well assessed due to contrast timing. Anatomic variants: None. Review of the MIP images confirms the above findings IMPRESSION: 1. Expected evolution of the acute to early subacute infarct in the left SCA distribution without evidence of malignant hemorrhagic transformation. No significant regional mass effect or effacement of the fourth ventricle. 2. 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 Otherwise, patent intracranial vasculature with no other hemodynamically significant stenosis or occlusion. 3. Mild calcified atherosclerotic plaque in the left carotid bulb and minimal plaque in the right carotid bulb without hemodynamically significant stenosis or occlusion. Patent vertebral arteries. 4. Unchanged remote infarcts in the left cerebral and cerebellar hemispheres. Electronically Signed   By: Valetta Mole M.D.   On: 09/22/2021 14:50   DG Abd 1 View  Result Date: 09/24/2021 CLINICAL DATA:  Abdominal pain, vomiting EXAM: ABDOMEN - 1 VIEW COMPARISON:  None. FINDINGS: The bowel gas pattern is normal. No radio-opaque calculi or other significant radiographic abnormality are seen. IMPRESSION: Nonobstructive pattern of bowel gas. No obvious free air in the abdomen on single supine radiograph. Electronically Signed   By: Delanna Ahmadi M.D.   On: 09/24/2021 14:20   CT HEAD WO CONTRAST (5MM)  Result Date: 09/23/2021 CLINICAL DATA:  Stroke, possible hemorrhagic conversion. EXAM: CT  HEAD WITHOUT CONTRAST TECHNIQUE: Contiguous axial images were obtained from the base of the skull through the vertex without intravenous contrast. COMPARISON:  09/22/2021. FINDINGS: Brain: No acute intracranial hemorrhage, midline shift or mass effect. No extra-axial fluid collection. There is redemonstration of hypodense attenuation and encephalomalacia region in the MCA territory on the left compatible with old infarct. A hypodense region is noted in the left cerebellar hemisphere, not significantly changed from the prior exam. Extensive subcortical and periventricular white matter hypodensities are noted bilaterally. There is no hydrocephalus. Vascular: Atherosclerotic calcification of the carotid siphons. No hyperdense vessel. Skull: Normal. Negative for fracture or focal lesion. Sinuses/Orbits: There is partial opacification of the ethmoid air cells and maxillary sinuses bilaterally. The orbits are stable. Other: None. IMPRESSION: 1. Hypodense region in the left cerebellar hemisphere, compatible with known acute infarct. No acute hemorrhage is identified. 2. Stable old infarct in the MCA territory on the left. 3. Atrophy with chronic microvascular ischemic changes. Electronically Signed   By: Brett Fairy M.D.   On: 09/23/2021 02:06   CT Head Wo Contrast  Result Date: 09/19/2021 CLINICAL DATA:  Altered behavior. Metal status change. Nausea with 1 episode of vomiting. EXAM: CT HEAD WITHOUT CONTRAST TECHNIQUE: Contiguous axial images were obtained from the base of the skull through the vertex without intravenous contrast. COMPARISON:  05/11/2020. FINDINGS: Brain: No evidence of acute infarction, hemorrhage, hydrocephalus, extra-axial collection or mass lesion/mass effect. Left MCA distribution infarct reflected by encephalomalacia with ex vacuo dilation of the left lateral ventricle, evolving since the prior head CT. Small hypoattenuating focus in the left cerebellum consistent with a old lacunar infarct,  new since the prior CT. Vascular: No hyperdense vessel or unexpected calcification. Skull: Normal. Negative for fracture or focal lesion. Sinuses/Orbits: Globes and orbits are unremarkable. Mild mucosal thickening in the left maxillary sinus. Posterior left ethmoid air cells mostly opacified by mucosal thickening. Remaining sinuses are clear. Other: None. IMPRESSION: 1. No acute intracranial abnormalities. 2.  Old left MCA distribution infarct has evolved since the prior head CT. Small left cerebellar infarct, also old. Electronically Signed   By: Lajean Manes M.D.   On: 09/19/2021 12:28   MR BRAIN WO CONTRAST  Addendum Date: 09/22/2021   ADDENDUM REPORT: 09/22/2021 11:05 ADDENDUM: Study discussed by telephone with Dr. Cicero Duck on 09/22/2021 at 1053 hours. Electronically Signed   By: Genevie Ann M.D.   On: 09/22/2021 11:05   Result Date: 09/22/2021 CLINICAL DATA:  65 year old female with altered mental status. Atrial fibrillation. Previous left MCA infarct. EXAM: MRI HEAD WITHOUT CONTRAST TECHNIQUE: Multiplanar, multiecho pulse sequences of the brain and surrounding structures were obtained without intravenous contrast. COMPARISON:  Brain MRI 04/21/2020.  Head CT 09/19/2021. FINDINGS: Brain: Patchy and confluent restricted diffusion in the left cerebellum, primarily the AICA territory with involvement near the left middle cerebellar peduncle (series 5, image 63 and series 7, image 43. The infarct encompasses roughly 4 cm. T2 and FLAIR hyperintense cytotoxic edema with associated cerebellar petechial hemorrhage (series 14, image 16), but no hemorrhagic transformation. Superimposed small chronic left PICA territory infarcts, although new from the MRI last year. Also there is significant left brainstem Wallerian degeneration since the MRI last year, through to the left medullary pyramid. And encephalomalacia with gliosis and laminar necrosis in the left MCA territory related to the infarct there last year.  Abundant associated hemosiderin. Ex vacuo enlargement of the left lateral ventricle. No other restricted diffusion. No midline shift, mass effect, evidence of mass lesion, ventriculomegaly. Cervicomedullary junction and pituitary are within normal limits. Patchy right hemisphere cerebral white matter T2 and FLAIR hyperintensity is stable from last year. No other cortical encephalomalacia or chronic cerebral blood products identified. Vascular: Major intracranial vascular flow voids are stable since last year. Skull and upper cervical spine: Negative visible cervical spine. Visualized bone marrow signal is within normal limits. Sinuses/Orbits: Negative orbits. Left ethmoid and left maxillary sinus fluid or mucosal thickening Other: Mastoids are clear. Grossly normal visible internal auditory structures. Negative visible scalp and face. IMPRESSION: 1. Acute moderate size Left AICA cerebellar infarct. Petechial hemorrhage but no malignant hemorrhagic transformation. Cytotoxic edema but no posterior fossa mass effect at this time. 2. Underlying extensive chronic left MCA territory encephalomalacia related to the 2021 infarct. Associated left brainstem Wallerian degeneration since last year. And small superimposed chronic Left PICA cerebellar infarcts, but also new since last year. Electronically Signed: By: Genevie Ann M.D. On: 09/22/2021 10:26   CT ABDOMEN PELVIS W CONTRAST  Result Date: 09/26/2021 CLINICAL DATA:  Nausea vomiting pyelonephritis EXAM: CT ABDOMEN AND PELVIS WITH CONTRAST TECHNIQUE: Multidetector CT imaging of the abdomen and pelvis was performed using the standard protocol following bolus administration of intravenous contrast. CONTRAST:  60mL OMNIPAQUE IOHEXOL 300 MG/ML  SOLN COMPARISON:  MRI 09/19/2021, CT 09/19/2021 FINDINGS: Lower chest: Lung bases demonstrate patchy atelectasis at the bases. No pleural effusion. Cardiomegaly with biatrial enlargement. Hepatobiliary: Multiple gallbladder folds. No  calcified stones. No focal hepatic abnormality. No biliary dilatation. Pancreas: Unremarkable. No pancreatic ductal dilatation or surrounding inflammatory changes. Spleen: Normal in size without focal abnormality. Adrenals/Urinary Tract: Adrenal glands are within normal limits. Multifocal hypoenhancement within the right kidney, greatest involving the upper and midpole, consistent with pyelonephritis. More focal hypodense collection at the upper pole measuring 3.1 by 2.7 cm, suspect for phlegmon or renal abscess, this is mildly decreased from the prior CT at which time the seen area measures 3.4 x 3.3 cm. There are several cysts in the left  kidney. Interim development of markedly abnormal appearance of left kidney with multifocal hypoenhancement involving large portion of the upper and midpole of left kidney with relative sparing of the lower pole. Some peripheral cortical enhancement is preserved at the midpole of the left kidney. Left perinephric fat stranding. No hydronephrosis or hydroureter. The bladder is unremarkable Stomach/Bowel: The stomach is nonenlarged. No dilated small bowel. No acute bowel wall thickening. Negative appendix. Vascular/Lymphatic: Nonaneurysmal aorta with mild atherosclerosis. New thrombus is present within the left main renal artery, series 3 image 32, and coronal series 6 image 36 through 40 on coronal views this is approximately 10 mm distal to the renal artery origin, thrombus extends to upper pole segmental vessels of the kidney. Renal veins appear patent. No suspicious adenopathy. Reproductive: Calcified uterine fibroid.  No adnexal mass. Other: Negative for pelvic effusion or free air. Musculoskeletal: No acute or significant osseous findings. IMPRESSION: 1. Interim development of markedly abnormal appearance of the left kidney with extensive areas of hypoenhancement, relative sparing of the lower pole. New finding of thrombus within the left renal artery. Suspect that the  hypoenhancement in the left kidney is largely due to renal infarct given the presence of thrombus in the artery, though there may be associated areas of pyelonephritis as well. 2. Residual patchy hypoenhancement at the right kidney, consistent with pyelonephritis. More focal hypodense collection at the upper pole right kidney is slightly smaller compared to the prior CT and may reflect phlegmon or resolving abscess (favored), with infarct also in the differential. 3. Calcified uterine fibroid Critical Value/emergent results were called by telephone at the time of interpretation on 09/26/2021 at 6:46 pm to provider Dr. Cicero Duck, Who verbally acknowledged these results. Electronically Signed   By: Donavan Foil M.D.   On: 09/26/2021 18:48   CT ABDOMEN PELVIS W CONTRAST  Result Date: 09/19/2021 CLINICAL DATA:  Nausea and vomiting. EXAM: CT ABDOMEN AND PELVIS WITH CONTRAST TECHNIQUE: Multidetector CT imaging of the abdomen and pelvis was performed using the standard protocol following bolus administration of intravenous contrast. CONTRAST:  178mL OMNIPAQUE IOHEXOL 300 MG/ML  SOLN COMPARISON:  CT 08/17/2014 FINDINGS: Lower chest: No acute abnormality. Subsegmental atelectasis at the lung bases. Biatrial cardiac enlargement. Hepatobiliary: No focal liver abnormality is seen. A 0.9 cm hyperdensity is seen within the gallbladder with C-shaped configuration of the gallbladder body (series 3, images 24-25). No gallbladder wall thickening or pericholecystic fluid. No bile duct dilatation. Pancreas: Mild dilatation of the main pancreatic duct within the pancreatic head measuring up to 0.5 cm in diameter (series 3, image 29; series 6, image 30). The remainder of the pancreatic duct is normal caliber. No peripancreatic fat stranding or fluid collection. Spleen: Normal in size without focal abnormality. Adrenals/Urinary Tract: The adrenal glands are unremarkable. Focal hypoenhancement at the upper pole of the right kidney  (series 3, image 24) as well as at the posterior and lateral aspects of the middle third of the right kidney (series 3, image 29, 33). There is subtle fat stranding at the right hilum. Cortical scarring noted at the upper pole of the left kidney. A few simple cortical and parapelvic cysts are noted in the left kidney. No hydronephrosis or nephrolithiasis. Urinary bladder is unremarkable. Stomach/Bowel: Stomach is within normal limits. Appendix appears normal. Scattered diverticula of the transverse and descending colon. No evidence of bowel wall thickening, distention, or inflammatory changes. Vascular/Lymphatic: No significant vascular findings are present. An enlarged 1.0 cm short axis superior aortocaval lymph node is noted (series  3, image 20). No other enlarged lymph nodes in the abdomen or pelvis. 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 The adnexa are unremarkable. Other: No abdominal wall hernia or abnormality. No abdominopelvic ascites. Musculoskeletal: No acute or significant osseous findings. IMPRESSION: 1. Multifocal pyelonephritis in the right kidney. No perinephric fluid collection. 2. An enlarged superior aortocaval lymph node is noted, likely reactive. Recommend attention on follow-up imaging. 3. Mild enlargement of the main pancreatic duct within the pancreatic head. When the patient is clinically stable and able to follow directions and hold their breath (preferably as an outpatient) further evaluation with dedicated abdominal MRI should be considered. 4. A 0.9 cm hyperdensity at the gallbladder body with associated C-shaped configuration of the gallbladder is worrisome for possible gallbladder wall mass. This can also be further evaluated on abdominal MRI. Electronically Signed   By: Ileana Roup M.D.   On: 09/19/2021 14:09   US RENAL  Result Date: 09/19/2021 CLINICAL DATA:  Pyelonephritis EXAM: RENAL / URINARY TRACT ULTRASOUND COMPLETE COMPARISON:  None.  FINDINGS: Right Kidney: Renal measurements: 10.1 x 4.2 x 5.0 cm = volume: 110 mL. Echogenicity within normal limits. No mass or hydronephrosis visualized. No perinephric fluid collection identified. No intrarenal calcifications are seen. Left Kidney: Renal measurements: 11.6 x 5.1 x 5.2 cm = volume: 160 mL. Echogenicity within normal limits. No mass or hydronephrosis visualized. No intrarenal calcifications. No perinephric fluid collections. 2 cm simple cortical cyst noted within the upper pole. Bladder: Appears normal for degree of bladder distention. Other: None. IMPRESSION: Normal renal sonogram. Parenchymal changes noted on MRI examination are not well appreciated on this exam. Electronically Signed   By: Fidela Salisbury M.D.   On: 09/19/2021 22:45   DG CHEST PORT 1 VIEW  Result Date: 09/25/2021 CLINICAL DATA:  Shortness of breath. EXAM: PORTABLE CHEST 1 VIEW COMPARISON:  Chest radiograph dated 09/19/2021. FINDINGS: Left lung base reticular densities, likely atelectasis. Developing infiltrate is less likely. No focal consolidation, pleural effusion, pneumothorax. Mild cardiomegaly. Atherosclerotic calcification of the aorta. No acute osseous pathology. IMPRESSION: Left lung base atelectasis. Developing infiltrate is less likely. Electronically Signed   By: Anner Crete M.D.   On: 09/25/2021 20:22   DG Chest Port 1 View  Result Date: 09/19/2021 CLINICAL DATA:  Altered behavior. Nausea with 1 episode of vomiting. EXAM: PORTABLE CHEST 1 VIEW COMPARISON:  None. FINDINGS: Cardiac silhouette mildly enlarged.  No mediastinal or hilar masses. Clear lungs.  No pleural effusion.  No pneumothorax. Skeletal structures are grossly intact. IMPRESSION: No active disease. Electronically Signed   By: Lajean Manes M.D.   On: 09/19/2021 12:24   EEG adult  Result Date: 09/23/2021 Lora Havens, MD     09/23/2021 11:44 AM Patient Name: Sharyne Benedix MRN: [id number] Epilepsy Attending: Lora Havens Referring  Physician/Provider: Dr Donnetta Simpers Date: 09/23/2021 Duration: 23.57 mins Patient history: 65 year old female with seizure-like episode.  EEG to evaluate for seizure. Level of alertness: Awake, asleep AEDs during EEG study: LEV Technical aspects: This EEG study was done with scalp electrodes positioned according to the 10-20 International system of electrode placement. Electrical activity was acquired at a sampling rate of 500Hz  and reviewed with a high frequency filter of 70Hz  and a low frequency filter of 1Hz . EEG data were recorded continuously and digitally stored. Description: The posterior dominant rhythm consists of 8 Hz activity of moderate voltage (25-35 uV) seen predominantly in posterior head regions, asymmetric ( left<right) and reactive to eye opening and eye  closing. Sleep was characterized by vertex waves, sleep spindles (12 to 14 Hz), maximal frontocentral region.  EEG showed continuous 3 to 5 Hz theta Parenta slowing in left hemisphere.  Hyperventilation and photic stimulation were not performed.   ABNORMALITY -Continued slow, left hemisphere IMPRESSION: This study is suggestive of cortical dysfunction in left hemisphere, nonspecific etiology but could be secondary to underlying structural abnormality, postictal state.  No seizures or epileptiform discharges were seen throughout the recording. If suspicion for ictal-interictal activity persists, please consider prolonged EEG. Lora Havens   MR ABDOMEN MRCP W WO CONTAST  Result Date: 09/19/2021 CLINICAL DATA:  Gallbladder mass, pyelonephritis, right upper quadrant pain EXAM: MRI ABDOMEN WITHOUT AND WITH CONTRAST (INCLUDING MRCP) TECHNIQUE: Multiplanar multisequence MR imaging of the abdomen was performed both before and after the administration of intravenous contrast. Heavily T2-weighted images of the biliary and pancreatic ducts were obtained, and three-dimensional MRCP images were rendered by post processing. CONTRAST:  44mL GADAVIST  GADOBUTROL 1 MMOL/ML IV SOLN COMPARISON:  CT 09/19/2021 FINDINGS: Lower chest: Mild cardiomegaly.  Small hiatal hernia. Hepatobiliary: Hepatic parenchymal signal characteristics are within normal limits. No intrahepatic mass identified. No intra or extrahepatic biliary ductal dilation. No choledocholithiasis. The gallbladder is unremarkable. Previously noted possible gallbladder wall mass simply represents a mucosal fold secondary to a Phrygian cap. Pancreas:  Unremarkable.  No pancreatic divisum. Spleen:  Unremarkable Adrenals/Urinary Tract: The adrenal glands are unremarkable. The kidneys are incompletely included on this examination with exclusion of the lower poles bilaterally. Multiple simple cortical cysts are seen within the left kidney. There are segmental regions of decreased T2 signal intensity demonstrating relative hypoenhancement on postcontrast images in keeping with changes of pyelonephritis within the right kidney. Additionally, within the upper pole is a more focal region measuring 3.0 x 3.5 cm demonstrating a small amount of adjacent perinephric edema, intrinsic markedly decreased T2 signal intensity with marginal restricted diffusion and heterogeneous enhancement areas of central non enhancement most in keeping with a developing renal abscess. No hydronephrosis. No loculated perinephric fluid collections. Stomach/Bowel: Visualized portions within the abdomen are unremarkable. Vascular/Lymphatic: The abdominal vasculature is unremarkable. Shotty para caval lymph node is again seen measuring 8 mm in short axis diameter, possibly reactive in nature. No frankly pathologic adenopathy within the abdomen and pelvis. Other:  None. Musculoskeletal: No suspicious bone lesions identified. IMPRESSION: Normal examination of the gallbladder. Questionable mass simply represents mucosal fold. Heterogeneous enhancement of the right kidney in keeping with changes of pyelonephritis. Superimposed more focal 3.5 cm  collection within the upper pole the right kidney in keeping with a developing renal abscess. No discrete drainable fluid component identified at this time. Follow-up evaluation following conservative therapy is recommended to document resolution. Shotty pericaval lymph node, possibly reactive in nature. Mild cardiomegaly. Small hiatal hernia. Electronically Signed   By: Fidela Salisbury M.D.   On: 09/19/2021 21:57   ECHOCARDIOGRAM COMPLETE  Result Date: 09/23/2021    ECHOCARDIOGRAM REPORT   Patient Name:   [name] Date of Exam: 09/23/2021 Medical Rec #:  [id number]     Height:       64.0 in Accession #:    [id number]    Weight:       165.0 lb Date of Birth:  [birth year]     BSA:          1.803 m Patient Age:    69 years      BP:           143/71 mmHg Patient  Gender: F             HR:           86 bpm. Exam Location:  Inpatient Procedure: 2D Echo, Cardiac Doppler and Color Doppler Indications:    Stroke  History:        Patient has prior history of Echocardiogram examinations, most                 recent 04/22/2020. Stroke, Arrythmias:Atrial Fibrillation; Risk                 Factors:Hypertension.  Sonographer:    Vanetta Shawl Referring Phys: [id number] NISCHAL NARENDRA IMPRESSIONS  1. Left ventricular ejection fraction, by estimation, is 50 to 55%. The left ventricle has low normal function. The left ventricle has no regional wall motion abnormalities. Left ventricular diastolic function could not be evaluated.  2. Right ventricular systolic function is normal. The right ventricular size is normal. There is normal pulmonary artery systolic pressure.  3. Left atrial size was mild to moderately dilated.  4. The mitral valve is normal in structure. Trivial mitral valve regurgitation. No evidence of mitral stenosis.  5. Tricuspid valve regurgitation is mild to moderate.  6. The aortic valve is tricuspid. Aortic valve regurgitation is not visualized. No aortic stenosis is present.  7. The inferior vena cava is  normal in size with <50% respiratory variability, suggesting right atrial pressure of 8 mmHg. Comparison(s): Changes from prior study are noted. Low normal EF on current study, in afib. FINDINGS  Left Ventricle: Left ventricular ejection fraction, by estimation, is 50 to 55%. The left ventricle has low normal function. The left ventricle has no regional wall motion abnormalities. The left ventricular internal cavity size was normal in size. There is no left ventricular hypertrophy. Left ventricular diastolic function could not be evaluated due to atrial fibrillation. Left ventricular diastolic function could not be evaluated. Right Ventricle: The right ventricular size is normal. Right vetricular wall thickness was not well visualized. Right ventricular systolic function is normal. There is normal pulmonary artery systolic pressure. The tricuspid regurgitant velocity is 2.58 m/s, and with an assumed right atrial pressure of 8 mmHg, the estimated right ventricular systolic pressure is 34.6 mmHg. Left Atrium: Left atrial size was mild to moderately dilated. Right Atrium: Right atrial size was not well visualized. Pericardium: There is no evidence of pericardial effusion. Mitral Valve: The mitral valve is normal in structure. Mild mitral annular calcification. Trivial mitral valve regurgitation. No evidence of mitral valve stenosis. Tricuspid Valve: The tricuspid valve is normal in structure. Tricuspid valve regurgitation is mild to moderate. No evidence of tricuspid stenosis. Aortic Valve: The aortic valve is tricuspid. Aortic valve regurgitation is not visualized. No aortic stenosis is present. Aortic valve mean gradient measures 2.0 mmHg. Aortic valve peak gradient measures 3.3 mmHg. Aortic valve area, by VTI measures 2.08 cm. Pulmonic Valve: The pulmonic valve was grossly normal. Pulmonic valve regurgitation is not visualized. No evidence of pulmonic stenosis. Aorta: The aortic root, ascending aorta, aortic arch  and descending aorta are all structurally normal, with no evidence of dilitation or obstruction. Venous: The inferior vena cava is normal in size with less than 50% respiratory variability, suggesting right atrial pressure of 8 mmHg. IAS/Shunts: The interatrial septum was not well visualized.  LEFT VENTRICLE PLAX 2D LVIDd:         3.80 cm   Diastology LVIDs:         3.00 cm   LV e'  medial:    6.54 cm/s LV PW:         1.00 cm   LV E/e' medial:  16.8 LV IVS:        1.00 cm   LV e' lateral:   8.71 cm/s LVOT diam:     1.90 cm   LV E/e' lateral: 12.6 LV SV:         38 LV SV Index:   21 LVOT Area:     2.84 cm  RIGHT VENTRICLE            IVC RV S prime:     7.62 cm/s  IVC diam: 2.00 cm LEFT ATRIUM             Index LA diam:        4.10 cm 2.27 cm/m LA Vol (A2C):   41.9 ml 23.24 ml/m LA Vol (A4C):   45.3 ml 25.13 ml/m LA Biplane Vol: 44.1 ml 24.46 ml/m  AORTIC VALVE AV Area (Vmax):    2.13 cm AV Area (Vmean):   2.03 cm AV Area (VTI):     2.08 cm AV Vmax:           90.40 cm/s AV Vmean:          67.100 cm/s AV VTI:            0.184 m AV Peak Grad:      3.3 mmHg AV Mean Grad:      2.0 mmHg LVOT Vmax:         67.90 cm/s LVOT Vmean:        48.000 cm/s LVOT VTI:          0.135 m LVOT/AV VTI ratio: 0.73  AORTA Ao Root diam: 3.10 cm Ao Asc diam:  3.00 cm MITRAL VALVE                TRICUSPID VALVE MV Area (PHT): 4.03 cm     TR Peak grad:   26.6 mmHg MV Decel Time: 188 msec     TR Vmax:        258.00 cm/s MV E velocity: 109.67 cm/s                             SHUNTS                             Systemic VTI:  0.14 m                             Systemic Diam: 1.90 cm Buford Dresser MD Electronically signed by Buford Dresser MD Signature Date/Time: 09/23/2021/2:57:32 PM    Final     Labs:  Basic Metabolic Panel: Recent Labs  Lab 09/21/21 0221 09/23/21 0233 09/24/21 0203 09/25/21 2034 09/26/21 0741 09/26/21 1951  NA 139 134* 135 132* QUANTITY NOT SUFFICIENT, UNABLE TO PERFORM TEST 133*  K 3.8 3.9 3.5  4.1 QUANTITY NOT SUFFICIENT, UNABLE TO PERFORM TEST 4.2  CL 104 99 103 97* QUANTITY NOT SUFFICIENT, UNABLE TO PERFORM TEST 105  CO2 26 21* 24 21* QUANTITY NOT SUFFICIENT, UNABLE TO PERFORM TEST 18*  GLUCOSE 97 135* 112* 167* QUANTITY NOT SUFFICIENT, UNABLE TO PERFORM TEST 114*  BUN 6* 8 9 15  QUANTITY NOT SUFFICIENT, UNABLE TO PERFORM TEST 17  CREATININE 0.69 0.79 0.79 1.61* QUANTITY NOT SUFFICIENT, UNABLE TO  PERFORM TEST 1.69*  CALCIUM 9.3 9.5 9.0 9.6 QUANTITY NOT SUFFICIENT, UNABLE TO PERFORM TEST 9.0    CBC: Recent Labs  Lab 09/24/21 1234 09/25/21 2034 09/26/21 0741  WBC 5.0 26.6* 30.0*  HGB 11.3* 11.8* 13.0  HCT 33.2* 33.5* 38.8  MCV 86.0 83.8 86.8  PLT 433* 419* 377    CBG: Recent Labs  Lab 09/23/21 0108 09/26/21 2230  GLUCAP 121* 111*    Brief HPI:   Tyajah Biddick is a 65 y.o. female who presented to the emergency department with nausea, vomiting, diaphoresis and change in baseline mental status.  Her initial evaluation included CT scan of the abdomen pelvis which revealed multifocal pyelonephritis of the right kidney.  Urology revealed her imaging and recommended infectious disease consultation.  She was started on intravenous antibiotics.  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  On 12/21 she had worsening speech pattern and MRI of the brain was performed which revealed an acute moderate size left AICA cerebellar infarct.  The following day, she was found to be tachycardic with decreased responsiveness and seizure activity was considered.  Neurology evaluated her and placed her on Keppra load followed by oral therapy.  She was transitioned to oral Levaquin.  Her history includes atrial fibrillation and she maintained her Eliquis twice daily.  She stabilized and blood cultures were drawn.  Depending upon results, TEE was considered.   Hospital Course: Lettie Munn was admitted to rehab on 09/24/2021 for inpatient therapies  to consist of PT, ST and OT at least three hours five days a week. Past admission physiatrist, therapy team and rehab RN have worked together to provide customized collaborative inpatient rehab. She underwent evaluations the following day. She had an episode of vomiting, but was able to hold down PO intake. Vital signs were stable and she was afebrile.  On the evening of 12/24 she developed mild tachycardia and fever. Repeat white blood cell count revealed white count of 26,000.  Antibiotics were broadened to include vancomycin and Zosyn.  IV fluids started for volume support.  On 12/25, repeat CT of the abdomen pelvis were performed.  EKG revealed a heart rate of 139 with rapid ventricular response.  Lopressor intravenously was given.  Infectious disease service was contacted.  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  Findings and case were discussed with internal medicine and patient was transferred from inpatient rehab to acute care internal medicine service.   Rehab course: During patient's stay in rehab weekly team conferences were held to monitor patient's progress, set goals and discuss barriers to discharge. At admission, patient required mod A and total A for donning paper scrub pants. She performed supine to/from sit with mod A for trunk and R lower extremity management. SLP eval: Mild verbal cues needed for use of small bites/sips. Recommend patient continue current diet of regular textures and thin liquids but a downgrade of solids may be warranted to maximize safety if patient's overall impulsivity and use of strategies does not improve  Disposition: Discharged to acute care service  Discharge Medications: Addressed by attending service.  Diet: Dysphagia 3     Signed: Barbie Banner 09/27/2021, 7:56 AM

## 2021-09-27 NOTE — Plan of Care (Signed)

## 2021-09-27 NOTE — Progress Notes (Addendum)
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 The patient's Eliquis was discontinued and she was started on heparin. IR was contacted and they recommended no intervention based on the patient's impaired renal function. Her HR elevated to the 150's and she was started on a diltiazem drip.   Interim History: On interview this morning, the patient is more alert and communicative than the previous two days. She produces spontaneous dysarthric speech which is difficult to understand. She is able to relate that she was no pain and is not in any discomfort.   OBJECTIVE:  Vital Signs: Vitals:   09/27/21 0200 09/27/21 0327 09/27/21 0345 09/27/21 0800  BP:   110/62 130/82  Pulse:   98 90  Resp:   20 19  Temp: 97.7 F (36.5 C) 99 F (37.2 C)  97.9 F (36.6 C)  TempSrc: Oral Oral  Oral  SpO2:   100% 96%      Intake/Output Summary (Last 24 hours) at 09/27/2021 0908 Last data filed at 09/27/2021 0353 Gross per 24 hour  Intake 0 ml  Output 650 ml  Net -650 ml   Net IO Since Admission: -650 mL [09/27/21 0908]  Physical Exam: Physical Exam Vitals reviewed.  Constitutional:      General: She is not in acute distress. Eyes:     Extraocular Movements: Extraocular movements intact.  Cardiovascular:     Rate and Rhythm: Tachycardia present. Rhythm irregular.     Heart sounds: No murmur heard. Pulmonary:     Effort: Pulmonary effort is normal.     Breath sounds: Normal breath sounds.  Abdominal:     General: Bowel sounds are normal.      Palpations: Abdomen is soft.     Tenderness: There is no abdominal tenderness.  Neurological:     Mental Status: She is alert.     Comments: She produces spontaneous dysarthric speech which is difficult to understand EOM are intact Grip strength is fair, 4/5 strength in RUE, 3/5 strength in RLE     Patient Lines/Drains/Airways Status     Active Line/Drains/Airways     Name Placement date Placement time Site Days   Peripheral IV 09/25/21 22 G 1" Left;Posterior Forearm 09/25/21  2205  Forearm  2   Peripheral IV 09/26/21 22 G 1" Right Antecubital 09/26/21  2309  Antecubital  1   Peripheral IV 09/26/21 20 G 1.88" Left;Anterior Forearm 09/26/21  2334  Forearm  1   External Urinary Catheter 09/19/21  1201  --  8             ASSESSMENT/PLAN:  Assessment: The patient is a 65 year old female with a PMHx of 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   Plan: #Multifocal pyelonephritis of the R kidney #L renal artery thrombosis, with resulting infarction CT A/P showing L  renal artery thrombosis, with resulting infarction, also notable for improvement in R sided pyelo. No intervention from IR. Cr increasing to 1.74 this morning, expect potential continued increase given her parenchymal loss. The thrombosis/infarction and recent strokes are likely 2/2 afib but the patient has been anticoagulated with Eliquis. Suspect a hypercoagulable state is contributing to this most recent embolism, potentially an occult infection. CXR negative, Ucx negative, Bcx pending. The patient appears improved from previous days. She is s/p ~2.5 L of fluid over the past 24 hrs. -Switch from Vanc/zosyn back to cefazolin -Continue heparin gtt -IVF pending lactate, patient appears  euvolemic -F/u Bcx and Ucx -TTE to evaluate for thrombus formation   #Afib with RVR Likely driven by the factors above. Expect improvement with amelioration of the above. Since starting the dilt drip, her heart rate has improved to 98-106.  -Continue dilt drip -D/C home amlodipine  #Hx L MCA stroke with R sided hemiplegia and aphasia #New AICA cerebellar stroke and PICA infarcts #Potential seizure -Continue Keppra 500 mg q12hrs -heparin for anticoagulation -Continue ASA 81 mg daily -Atorvastatin 20 mg daily   FEN/GI: dys 3 diet, IVF as above VTE:  heparin Dispo: TBD Code Status: FULL  Signature: Carlyn Reichert, MD PGY-1 Pager: [phone]  Please contact the on call pager after 5 pm and on weekends at [phone].

## 2021-09-28 DIAGNOSIS — N280 Ischemia and infarction of kidney: Secondary | ICD-10-CM

## 2021-09-28 LAB — CBC
HCT: 28.8 % — ABNORMAL LOW (ref 36.0–46.0)
Hemoglobin: 9.9 g/dL — ABNORMAL LOW (ref 12.0–15.0)
MCH: 28.5 pg (ref 26.0–34.0)
MCHC: 34.4 g/dL (ref 30.0–36.0)
MCV: 83 fL (ref 80.0–100.0)
Platelets: 300 10*3/uL (ref 150–400)
RBC: 3.47 MIL/uL — ABNORMAL LOW (ref 3.87–5.11)
RDW: 15.1 % (ref 11.5–15.5)
WBC: 19.5 10*3/uL — ABNORMAL HIGH (ref 4.0–10.5)
nRBC: 0 % (ref 0.0–0.2)

## 2021-09-28 LAB — CULTURE, BLOOD (ROUTINE X 2)
Culture: NO GROWTH
Culture: NO GROWTH
Special Requests: ADEQUATE

## 2021-09-28 LAB — PROTIME-INR
INR: 1.9 — ABNORMAL HIGH (ref 0.8–1.2)
Prothrombin Time: 21.7 seconds — ABNORMAL HIGH (ref 11.4–15.2)

## 2021-09-28 LAB — BASIC METABOLIC PANEL
Anion gap: 10 (ref 5–15)
BUN: 14 mg/dL (ref 8–23)
CO2: 21 mmol/L — ABNORMAL LOW (ref 22–32)
Calcium: 8.7 mg/dL — ABNORMAL LOW (ref 8.9–10.3)
Chloride: 102 mmol/L (ref 98–111)
Creatinine, Ser: 1.33 mg/dL — ABNORMAL HIGH (ref 0.44–1.00)
GFR, Estimated: 44 mL/min — ABNORMAL LOW (ref >60)
Glucose, Bld: 113 mg/dL — ABNORMAL HIGH (ref 70–99)
Potassium: 3.7 mmol/L (ref 3.5–5.1)
Sodium: 133 mmol/L — ABNORMAL LOW (ref 135–145)

## 2021-09-28 LAB — GLUCOSE, CAPILLARY: Glucose-Capillary: 125 mg/dL — ABNORMAL HIGH (ref 70–99)

## 2021-09-28 LAB — HEPARIN LEVEL (UNFRACTIONATED): Heparin Unfractionated: >1.1 IU/mL — ABNORMAL HIGH (ref 0.30–0.70)

## 2021-09-28 LAB — APTT: aPTT: 78 seconds — ABNORMAL HIGH (ref 24–36)

## 2021-09-28 MED ORDER — METOPROLOL TARTRATE 50 MG PO TABS
50.0000 mg | ORAL_TABLET | Freq: Two times a day (BID) | ORAL | Status: DC
  Administered 2021-09-28 – 2021-10-01 (×7): 50 mg via ORAL
  Filled 2021-09-28 (×7): qty 1

## 2021-09-28 MED ORDER — POTASSIUM CHLORIDE 10 MEQ/100ML IV SOLN
10.0000 meq | INTRAVENOUS | Status: AC
  Administered 2021-09-28: 10:00:00 10 meq via INTRAVENOUS
  Filled 2021-09-28: qty 100

## 2021-09-28 MED ORDER — ENOXAPARIN SODIUM 80 MG/0.8ML IJ SOSY
65.0000 mg | PREFILLED_SYRINGE | Freq: Two times a day (BID) | INTRAMUSCULAR | Status: DC
  Administered 2021-09-28 – 2021-10-01 (×7): 65 mg via SUBCUTANEOUS
  Filled 2021-09-28 (×7): qty 0.8

## 2021-09-28 MED ORDER — POTASSIUM CHLORIDE 10 MEQ/100ML IV SOLN
INTRAVENOUS | Status: AC
  Administered 2021-09-28: 12:00:00 10 meq
  Filled 2021-09-28: qty 100

## 2021-09-28 MED ORDER — WARFARIN SODIUM 2 MG PO TABS
2.0000 mg | ORAL_TABLET | Freq: Once | ORAL | Status: AC
  Administered 2021-09-28: 17:00:00 2 mg via ORAL
  Filled 2021-09-28: qty 1

## 2021-09-28 MED ORDER — LACTATED RINGERS IV SOLN: INTRAVENOUS | Status: AC

## 2021-09-28 MED ORDER — WARFARIN - PHARMACIST DOSING INPATIENT: Freq: Every day | Status: DC

## 2021-09-28 NOTE — Progress Notes (Signed)
Inpatient Rehab Admissions Coordinator:   Per Dr. Jerrel Ivory, likely ready to return to CIR tomorrow.  PT eval pending, but spoke with PT who states likely recommend return to CIR. I placed a consult order so we can follow for possible readmit to CIR tomorrow pending bed availability.  I will be off but one of my colleagues will follow up.   Estill Dooms, PT, DPT Admissions Coordinator [phone] 09/28/21  1:08 PM

## 2021-09-28 NOTE — Progress Notes (Signed)
Heart rate dropped to 60's A-fib cardizem gtt rate changed to 5 ml/hr. Continue to monitor.

## 2021-09-28 NOTE — Care Management Important Message (Signed)
Important Message  Patient Details  Name: Lindsey [name] MRN: [id number] Date of Birth: April 09, 1956   Medicare Important Message Given:  Yes     Emmett Bracknell Stefan Church 09/28/2021, 3:16 PM

## 2021-09-28 NOTE — Progress Notes (Signed)
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   Overnight Events: NAEON  Interim History: On interview this morning, the patient is alert and communicative. She produces dysarthric speech when asked questions. She is able to relate that she is not having any pain and feels better than previous days.   OBJECTIVE:  Vital Signs: Vitals:   09/27/21 2320 09/28/21 0348 09/28/21 0800 09/28/21 1200  BP: (!) 142/93 133/84    Pulse: (!) 123 (!) 103 (!) 103   Resp: (!) 22 (!) 25    Temp: 98.7 F (37.1 C) 99.6 F (37.6 C) 98.4 F (36.9 C)   TempSrc: Oral Axillary Oral   SpO2: 95% 96% 97% 97%      Intake/Output Summary (Last 24 hours) at 09/28/2021 1207 Last data filed at 09/28/2021 1100 Gross per 24 hour  Intake 1595.35 ml  Output 1650 ml  Net -54.65 ml    Net IO Since Admission: -405.15 mL [09/28/21 1207]  Physical Exam: Physical Exam Vitals reviewed.  Constitutional:      General: She is not in acute distress. Eyes:     Extraocular Movements: Extraocular movements intact.  Cardiovascular:     Rate and Rhythm: Tachycardia present. Rhythm irregular.     Heart sounds: No murmur heard. Pulmonary:     Effort: Pulmonary effort is normal.     Breath sounds: Normal breath sounds.  Abdominal:     General: Bowel sounds are normal.     Palpations: Abdomen is soft.     Tenderness: There is no abdominal tenderness.  Neurological:     Mental Status: She is alert.     Comments: She produces spontaneous dysarthric speech which is difficult to understand     Patient Lines/Drains/Airways Status     Active Line/Drains/Airways     Name  Placement date Placement time Site Days   Peripheral IV 09/25/21 22 G 1" Left;Posterior Forearm 09/25/21  2205  Forearm  2   Peripheral IV 09/26/21 22 G 1" Right Antecubital 09/26/21  2309  Antecubital  1   Peripheral IV 09/26/21 20 G 1.88" Left;Anterior Forearm 09/26/21  2334  Forearm  1   External Urinary Catheter 09/19/21  1201  --  8             ASSESSMENT/PLAN:  Assessment: The patient is a 65 year old female with a PMHx of 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    Plan: #Hx L MCA stroke with R sided hemiplegia and aphasia #New AICA cerebellar stroke and PICA infarcts #L renal artery thrombosis, with resulting infarction Suspect patient's acute presentation (leukocytosis, fever, RVR) was secondary to L renal artery thrombosis and renal infarction. She is much better clinically, with improved participation on exam, downtrending leukocytosis. It is difficult to explain why the patient has had these recent embolic events despite receiving Eliquis. TTE revealed no evidence of thrombus. 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 Given her failure on Elqiuis, we will switch her to warfarin with a Lovenox bridge.  -Discontinue heparin -Start Lovenox and warfarin per pharmacy, appreciate assistance -Continue ASA 81 mg daily -Atorvastatin 20 mg daily -IVF at 100 mL/hr for 10 hrs today -Anticipate patient will be stable for discharge tomorrow -Will notify CIR  #Multifocal pyelonephritis of the R kidney CT A/P notable for improvement in R sided pyelo. Blood cultures are negative at 2d. Cr has gone down to 1.3 as of this morning. ID has now put in recommendations for cefazolin IV until  discharge and cefadroxil thereafter.  -Continue cefazolin IV -Consider switching patient to cefadroxil tomorrow -F/u Bcx  #Afib with RVR Likely driven by the factors above. Expect improvement with amelioration of the above. On dilt drip and home Lopressor 50 mg BID. HR improved, has been 67-100 over the past 12 hrs with stable BP.  -Continue dilt drip and Lopressor -Likely D/C dilt drip tomorrow -K>4, Mg 2 -Hold home amlodipine  #Potential seizure -Continue Keppra 500 mg q12hrs  FEN/GI: dys 3 diet, IVF as above VTE:  Lovenox and warfarin Dispo: CIR, possibly tomorrow Code Status: FULL  Signature: Corky Sox, MD PGY-1 Pager: [phone]  Please contact the on call pager after 5 pm and on weekends at [phone].

## 2021-09-28 NOTE — Progress Notes (Signed)
[facility]  Date of Admission:  09/26/2021           Reason for visit: Follow up on Klebsiella renal abscess  Current antibiotics: Cefazolin  ASSESSMENT:    65 y.o. female admitted with:  #Klebsiella pneumonia pyelonephritis and renal abscess -Was previously followed by ID and transitioned to oral levofloxacin on 12/21 prior to discharge to rehab on 12/23.  Tentative plan was 4 weeks antibiotics from 12/21 -Decompensated while in rehab with fever, tachycardia, leukocytosis.  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  #Leukocytosis - WBC improving down to 19.5 today  #Renal infarct  #Atrial fibrillation  #History of CVA  RECOMMENDATIONS:    Continue cefazolin IV while hospitalized At discharge, would transition to cefadroxil 1gm BID PO Tentative plan for 4 weeks of therapy ending 10/20/21 Has follow up that is scheduled with Dr Baxter Flattery on 10/18/21 @ 9:15am Will sign off, please call as needed   Principal Problem:   Pyelonephritis    MEDICATIONS:    Scheduled Meds:  aspirin EC  81 mg Oral Daily   atorvastatin  20 mg Oral q1800   metoprolol tartrate  50 mg Oral BID   Continuous Infusions:   ceFAZolin (ANCEF) IV 2 g (09/28/21 [id number])   diltiazem (CARDIZEM) infusion 12 mg/hr (09/28/21 [id number])   heparin 950 Units/hr (09/28/21 [id number])   levETIRAcetam 400 mL/hr at 09/28/21 0611   potassium chloride     PRN Meds:.acetaminophen **OR** acetaminophen, ondansetron, polyethylene glycol  SUBJECTIVE:   24 hour events:  No acute events noted overnight Afebrile, T-max 99.6 WBC improved  No new complaints.  Continues to have an expressive aphasia.  Denies fevers or new pain  Review of Systems  All other systems reviewed and are negative.    OBJECTIVE:   Blood pressure 133/84, pulse  (!) 103, temperature 99.6 F (37.6 C), temperature source Axillary, resp. rate (!) 25, SpO2 96 %. There is no height or weight on file to calculate BMI.  Physical Exam Constitutional:      General: She is not in acute distress. HENT:     Head: Normocephalic and atraumatic.  Eyes:     Extraocular Movements: Extraocular movements intact.     Conjunctiva/sclera: Conjunctivae normal.  Pulmonary:     Effort: Pulmonary effort is normal. No respiratory distress.  Abdominal:     General: There is no distension.     Palpations: Abdomen is soft.     Tenderness: There is no abdominal tenderness.  Neurological:     Mental Status: She is alert. Mental status is at baseline.     Comments: + Expressive aphasia  Psychiatric:        Mood and Affect: Mood normal.        Behavior: Behavior normal.     Lab Results: Lab Results  Component Value Date   WBC 19.5 (H) 09/28/2021   HGB 9.9 (L) 09/28/2021   HCT 28.8 (L) 09/28/2021   MCV 83.0 09/28/2021   PLT 300 09/28/2021    Lab Results  Component Value Date   NA 133 (L) 09/28/2021   K 3.7 09/28/2021   CO2 21 (L) 09/28/2021   GLUCOSE 113 (H) 09/28/2021   BUN 14 09/28/2021   CREATININE 1.33 (H) 09/28/2021   CALCIUM 8.7 (L) 09/28/2021   GFRNONAA 44 (L) 09/28/2021   GFRAA >  60 05/25/2020    Lab Results  Component Value Date   ALT 32 09/27/2021   AST 71 (H) 09/27/2021   GGT 374 (H) 08/17/2014   ALKPHOS 75 09/27/2021   BILITOT 1.1 09/27/2021    No results found for: CRP  No results found for: ESRSEDRATE   I have reviewed the micro and lab results in Epic.  Imaging: CT ABDOMEN PELVIS W CONTRAST  Result Date: 09/26/2021 CLINICAL DATA:  Nausea vomiting pyelonephritis EXAM: CT ABDOMEN AND PELVIS WITH CONTRAST TECHNIQUE: Multidetector CT imaging of the abdomen and pelvis was performed using the standard protocol following bolus administration of intravenous contrast. CONTRAST:  74mL OMNIPAQUE IOHEXOL 300 MG/ML  SOLN COMPARISON:  MRI  09/19/2021, CT 09/19/2021 FINDINGS: Lower chest: Lung bases demonstrate patchy atelectasis at the bases. No pleural effusion. Cardiomegaly with biatrial enlargement. Hepatobiliary: Multiple gallbladder folds. No calcified stones. No focal hepatic abnormality. No biliary dilatation. Pancreas: Unremarkable. No pancreatic ductal dilatation or surrounding inflammatory changes. Spleen: Normal in size without focal abnormality. Adrenals/Urinary Tract: Adrenal glands are within normal limits. Multifocal hypoenhancement within the right kidney, greatest involving the upper and midpole, consistent with pyelonephritis. More focal hypodense collection at the upper pole measuring 3.1 by 2.7 cm, suspect for phlegmon or renal abscess, this is mildly decreased from the prior CT at which time the seen area measures 3.4 x 3.3 cm. There are several cysts in the left kidney. Interim development of markedly abnormal appearance of left kidney with multifocal hypoenhancement involving large portion of the upper and midpole of left kidney with relative sparing of the lower pole. Some peripheral cortical enhancement is preserved at the midpole of the left kidney. Left perinephric fat stranding. No hydronephrosis or hydroureter. The bladder is unremarkable Stomach/Bowel: The stomach is nonenlarged. No dilated small bowel. No acute bowel wall thickening. Negative appendix. Vascular/Lymphatic: Nonaneurysmal aorta with mild atherosclerosis. New thrombus is present within the left main renal artery, series 3 image 32, and coronal series 6 image 36 through 40 on coronal views this is approximately 10 mm distal to the renal artery origin, thrombus extends to upper pole segmental vessels of the kidney. Renal veins appear patent. No suspicious adenopathy. Reproductive: Calcified uterine fibroid.  No adnexal mass. Other: Negative for pelvic effusion or free air. Musculoskeletal: No acute or significant osseous findings. IMPRESSION: 1. Interim  development of markedly abnormal appearance of the left kidney with extensive areas of hypoenhancement, relative sparing of the lower pole. New finding of thrombus within the left renal artery. Suspect that the hypoenhancement in the left kidney is largely due to renal infarct given the presence of thrombus in the artery, though there may be associated areas of pyelonephritis as well. 2. Residual patchy hypoenhancement at the right kidney, consistent with pyelonephritis. More focal hypodense collection at the upper pole right kidney is slightly smaller compared to the prior CT and may reflect phlegmon or resolving abscess (favored), with infarct also in the differential. 3. Calcified uterine fibroid Critical Value/emergent results were called by telephone at the time of interpretation on 09/26/2021 at 6:46 pm to provider Dr. Cicero Duck, Who verbally acknowledged these results. Electronically Signed   By: Donavan Foil M.D.   On: 09/26/2021 18:48   ECHOCARDIOGRAM LIMITED BUBBLE STUDY  Result Date: 09/27/2021    ECHOCARDIOGRAM LIMITED REPORT   Patient Name:   Lindsey [name] Date of Exam: 09/27/2021 Medical Rec #:  [id number]     Height:       64.2 in Accession #:  [id number]    Weight:       146.2 lb Date of Birth:  09/04/1956     BSA:          1.715 m Patient Age:    27 years      BP:           130/82 mmHg Patient Gender: F             HR:           95 bpm. Exam Location:  Inpatient Procedure: Limited Echo, Color Doppler, Cardiac Doppler and Saline Contrast            Bubble Study Indications:    Renal artery thrombosis  History:        Patient has prior history of Echocardiogram examinations, most                 recent 09/23/2021. Stroke; Risk Factors:Hypertension.  Sonographer:    Glo Herring Referring Phys: [place]  1. Left ventricular ejection fraction, by estimation, is 60 to 65%. The left ventricle has normal function. The left ventricle has no regional wall motion  abnormalities. Left ventricular diastolic function could not be evaluated.  2. Left atrial size was moderately dilated.  3. Right atrial size was mildly dilated.  4. Tricuspid valve regurgitation is moderate to severe.  5. The aortic valve is tricuspid. Aortic valve regurgitation is not visualized. No aortic stenosis is present.  6. There is moderately elevated pulmonary artery systolic pressure.  7. The inferior vena cava is normal in size with <50% respiratory variability, suggesting right atrial pressure of 8 mmHg.  8. Agitated saline contrast bubble study was negative, with no evidence of any interatrial shunt. FINDINGS  Left Ventricle: Left ventricular ejection fraction, by estimation, is 60 to 65%. The left ventricle has normal function. The left ventricle has no regional wall motion abnormalities. The left ventricular internal cavity size was normal in size. There is  no left ventricular hypertrophy. Left ventricular diastolic function could not be evaluated. Left ventricular diastolic function could not be evaluated due to atrial fibrillation. Right Ventricle: There is moderately elevated pulmonary artery systolic pressure. The tricuspid regurgitant velocity is 3.12 m/s, and with an assumed right atrial pressure of 8 mmHg, the estimated right ventricular systolic pressure is 0000000 mmHg. Left Atrium: Left atrial size was moderately dilated. Right Atrium: Right atrial size was mildly dilated. Pericardium: There is no evidence of pericardial effusion. Mitral Valve: Mild mitral annular calcification. Tricuspid Valve: Tricuspid valve regurgitation is moderate to severe. Aortic Valve: The aortic valve is tricuspid. Aortic valve regurgitation is not visualized. No aortic stenosis is present. Venous: The inferior vena cava is normal in size with less than 50% respiratory variability, suggesting right atrial pressure of 8 mmHg. IAS/Shunts: No atrial level shunt detected by color flow Doppler. Agitated saline contrast  was given intravenously to evaluate for intracardiac shunting. Agitated saline contrast bubble study was negative, with no evidence of any interatrial shunt. LEFT VENTRICLE PLAX 2D LVIDd:         4.50 cm LVIDs:         2.90 cm LV PW:         0.90 cm LV IVS:        0.90 cm  IVC IVC diam: 1.80 cm LEFT ATRIUM         Index LA diam:    4.80 cm 2.80 cm/m  TRICUSPID VALVE TR Peak grad:   38.9  mmHg TR Vmax:        312.00 cm/s Thurmon Fair MD Electronically signed by Thurmon Fair MD Signature Date/Time: 09/27/2021/2:20:30 PM    Final      Imaging independently reviewed in Epic.    [facility] Coffer for Infectious Disease [facility] [phone] pager 09/28/2021, 8:13 AM  I spent greater than 35 minutes with the patient including greater than 50% of time in face to face counsel of the patient and in coordination of their care.

## 2021-09-28 NOTE — PMR Pre-admission (Signed)
PMR Admission Coordinator Pre-Admission Assessment  Patient: Lindsey [name] is an 65 y.o., female MRN: [id number] DOB: [birth year] Height:   Weight:    Insurance Information HMO:     PPO:      PCP:      IPA:      80/20:      OTHER:  PRIMARY: Medicare A and B      Policy#: [id number]            Subscriber: pt CM Name:       Phone#:      Fax#:  Pre-Cert#: verified Civil engineer, contracting:  Benefits:  Phone #:      Name:  Eff. Date: A/B 03/03/21     Deduct: $1556 (2023 $1600)      Out of Pocket Max:       Life Max:  CIR: 1005      SNF: 20 full days Outpatient: 80%     Co-Ins: 20% Home Health: 100%      Co-Pay:  DME: 80%     Co-Ins: 20% Providers:  SECONDARY: Medicaid of       Policy#: [id number]     Phone#:   Financial Counselor:       Phone#:   The Actuary for patients in Inpatient Rehabilitation Facilities with attached Privacy Act [place] Records was provided and verbally reviewed with: Family  Emergency Contact Information Contact Information     Name Relation Home Work Mobile   [place] Daughter [phone]  [phone]   Claybon Jabs   [id number]      Current Medical History  Patient Admitting Diagnosis: CVA, pyelonephritis, renal infarct    History of Present Illness:  Lindsey [name] is a 65 year old female with a past medical history significant for 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  She presented to the emergency department on September 19, 2021 with new onset of nausea, vomiting diaphoresis as well as change in baseline mental status as reported by her family.  She had no report of loss of consciousness or seizure type activity.  No known fever or chills.  As part of her work-up, she underwent CT scan of the abdomen pelvis which revealed multifocal pyelonephritis of the right kidney.  Urology was contacted and Dr. Jeffie Pollock reviewed the CT scan and the patient's data.  He recommended infectious  disease consultation.  She was started on empiric antibiotics.  Her urine was positive for Klebsiella.  She was transitioned to oral Levaquin on 1219. On admission, CT scan of the head was also performed which was negative.  Due to worsening speech, MRI was performed on 12/21 which revealed an acute moderate sized left AICA cerebellar infarct.  Underlying extensive chronic left MCA territory encephalomalacia related to the 2021 infarct was noted.  Associated left brainstem wallerian degeneration since last year was noted.  Small superimposed chronic left PICA cerebellar infarcts were new since last year.  She developed elevated heart rate, decreased responsiveness and foaming at her mouth on 12/22 and rapid response was called.  Neurology evaluated and Keppra load of 1500 mg IV was given and she was started on Keppra 500 mg twice a day.  Aspirin 81 mg was added.  2D echo was completed and was negative.  Ejection fraction was estimated at 50 to 55%.   Patient was admitted to physical medicine and rehab inpatient services on September 24, 2021.  On the evening of 12/24, she developed mild tachycardia  and fever to 102.5.  Lab work was completed at her repeat white blood cell count revealed elevation to 26,000.  His antibiotics were broadened to include vancomycin and Zosyn.  IV fluids are started for volume support.  On 12/25, repeat CT of the abdomen pelvis were performed.  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  EKG revealed a heart rate of 139 with rapid ventricular response..  Lopressor was given intravenously.  These findings and the case were discussed with internal medicine and the patient was transferred from inpatient rehab to acute care internal medicine service.  She was started on diltiazem infusion. Eliquis was discontinued and she was started on heparin infusion.  As she was maintained  on Eliquis throughout her admission to CIR, concern for antiphospholipid antibody syndrome was considered.  Work-up with cardiolipin and beta-2 glycoprotein antibodies are pending.  Lupus anticoagulant is also pending however this may not be reliable since she was receiving heparin infusion.  She remains in A. fib on review of telemetry but her rates are overall improved.  She was started on Lovenox 65 mg every 12 hours on 12/27.  Her first dose of warfarin 2 mg was administered on 12/27 and 1645 hrs.   Infectious disease consultation was obtained by Dr. Carlyle Basques. Antibiotics were narrowed back to cefazolin since no new source of infection was found.  Her white blood cell count improved.  She has been afebrile for approximately 48 hours.   Laboratory work-up revealed acute kidney injury with increase in serum creatinine from 0.79 on 12/23 to 1.61 on 12/24.  Trended upward to 1.74 on 12/26.  Today, her serum creatinine is 1.32.  She had 1800 cc of urine output yesterday.   Complete NIHSS TOTAL: 16   Complete NIHSS TOTAL: 16  Patient's medical record from [facility] has been reviewed by the rehabilitation admission coordinator and physician.  Past Medical History  Past Medical History:  Diagnosis Date   Diverticulosis    Hypertension    Uterine prolapse    Has the patient had major surgery during 100 days prior to admission? No  Family History   family history is not on file.  Current Medications  Current Facility-Administered Medications:    acetaminophen (TYLENOL) tablet 650 mg, 650 mg, Oral, Q6H PRN **OR** acetaminophen (TYLENOL) suppository 650 mg, 650 mg, Rectal, Q6H PRN, Lacinda Axon, MD, 650 mg at 09/26/21 2245   aspirin EC tablet 81 mg, 81 mg, Oral, Daily, Virl Axe, MD, 81 mg at 10/01/21 0916   atorvastatin (LIPITOR) tablet 20 mg, 20 mg, Oral, q1800, Virl Axe, MD, 20 mg at 09/30/21 1753   ceFAZolin (ANCEF) IVPB 2g/100 mL premix, 2 g, Intravenous, Q8H,  Carlyle Basques, MD, Last Rate: 200 mL/hr at 10/01/21 0549, 2 g at 10/01/21 0549   diltiazem (CARDIZEM CD) 24 hr capsule 180 mg, 180 mg, Oral, Daily, Aslam, Sadia, MD, 180 mg at 10/01/21 0917   enoxaparin (LOVENOX) injection 65 mg, 65 mg, Subcutaneous, Q12H, Einar Grad, RPH, 65 mg at 10/01/21 [id number]   levETIRAcetam (KEPPRA) tablet 500 mg, 500 mg, Oral, BID, Aslam, Sadia, MD, 500 mg at 10/01/21 0917   metoprolol tartrate (LOPRESSOR) tablet 50 mg, 50 mg, Oral, BID, Virl Axe, MD, 50 mg at 10/01/21 0916   ondansetron (ZOFRAN) tablet 4 mg, 4 mg, Oral, Q12H PRN, Virl Axe, MD   polyethylene glycol (MIRALAX / GLYCOLAX) packet 17  g, 17 g, Oral, Daily PRN, Merrilyn Puma, MD   Warfarin - Pharmacist Dosing Inpatient, , Does not apply, q1600, [facility], Given at 09/29/21 1743  Patients Current Diet:  Diet Order             Diet - low sodium heart healthy           DIET DYS 3 Room service appropriate? Yes; Fluid consistency: Thin  Diet effective now                  Precautions / Restrictions Precautions Precautions: Fall, Other (comment) Precaution Comments: Baseline R-side hemiplegia; urine incontinence Other Brace: Baseline R AFO for ambulation Restrictions Weight Bearing Restrictions: No   Has the patient had 2 or more falls or a fall with injury in the past year? No  Prior Activity Level Household: able to ambulate household distances with [facility] and supervision, assit for ADLs from daughter  Prior Functional Level Self Care: Did the patient need help bathing, dressing, using the toilet or eating? Needed some help  Indoor Mobility: Did the patient need assistance with walking from room to room (with or without device)? Needed some help  Stairs: Did the patient need assistance with internal or external stairs (with or without device)? Dependent  Functional Cognition: Did the patient need help planning regular tasks such as shopping or remembering to take  medications? Needed some help  Patient Information Are you of Hispanic, Latino/a,or Spanish origin?: A. No, not of Hispanic, Latino/a, or Spanish origin, X. Patient unable to respond (daughter responded) What is your race?: B. Black or African American, X. Patient unable to respond (daughter responded) Do you need or want an interpreter to communicate with a doctor or health care staff?: 9. Unable to respond  Patient's Response To:  Health Literacy and Transportation Is the patient able to respond to health literacy and transportation needs?: No (daughter responded) Health Literacy - How often do you need to have someone help you when you read instructions, pamphlets, or other written material from your doctor or pharmacy?: Patient unable to respond In the past 12 months, has lack of transportation kept you from medical appointments or from getting medications?: No In the past 12 months, has lack of transportation kept you from meetings, work, or from getting things needed for daily living?: No  Journalist, newspaper / Equipment Home Assistive Devices/Equipment: None Home Equipment: Information systems manager, Wheelchair - manual, Medical laboratory scientific officer - single point, Agricultural consultant (2 wheels), Other (comment), Hospital bed  Prior Device Use: Indicate devices/aids used by the patient prior to current illness, exacerbation or injury? Walker and SPC  Current Functional Level Cognition  Overall Cognitive Status: Difficult to assess Difficult to assess due to: Impaired communication Current Attention Level: Selective Orientation Level: Oriented to place, Oriented to person, Disoriented to time, Disoriented to situation Following Commands: Follows one step commands with increased time Safety/Judgement: Decreased awareness of safety, Decreased awareness of deficits General Comments: pt with increased flat affect and minimal verbalizations compared to yesterday's session (seems to be related to pt wanting to d/c home instead  of CIR); becoming somewhat more interactive as session continued. requires frequent cues for sequencing with transfers. H/o dysarthria and aphasia    Extremity Assessment (includes Sensation/Coordination)  Upper Extremity Assessment: RUE deficits/detail RUE Deficits / Details: h/o hemiplegia from h/o CVA (prior to most recent CVA 08/2021), 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 though unable to specify wearing schedule RUE Coordination: decreased fine motor,  decreased gross motor  Lower Extremity Assessment: Defer to PT evaluation RLE Deficits / Details: R knee flex/ext and hip flex <3/5; baseline R ankle AFO due to minimal to no ankle DF/PF activation (from h/o stroke)    ADLs  Overall ADL's : Needs assistance/impaired Grooming: Minimal assistance, Sitting Upper Body Bathing: Moderate assistance, Sitting Upper Body Dressing : Moderate assistance, Sitting Lower Body Dressing: Maximal assistance, Sit to/from stand Lower Body Dressing Details (indicate cue type and reason): to don socks; has assist with this at baseline Toilet Transfer: Maximal assistance, Stand-pivot, BSC/3in1 Toilet Transfer Details (indicate cue type and reason): assist to advance R LE (pt reported only using AFO for mobility at home but did not need for transfers; unsure of accuracy)    Mobility  Overal bed mobility: Needs Assistance Bed Mobility: Supine to Sit Supine to sit: Mod assist, HOB elevated General bed mobility comments: Received sitting in recliner; pt declined transfer back to bed    Transfers  Overall transfer level: Needs assistance Equipment used: 1 person hand held assist Transfers: Sit to/from Stand Sit to Stand: Mod assist Bed to/from chair/wheelchair/BSC transfer type:: Stand pivot Stand pivot transfers: Max assist Step pivot transfers: Mod assist General transfer comment: Heavy modA for trunk elevation on R-side for 2x standing from recliner, repeated cues for LUE hand  placement; pt declined R AFO donned for standing, noted R foot sliding with knee hyperextension upon standing    Ambulation / Gait / Stairs / Office manager / Balance Dynamic Sitting Balance Sitting balance - Comments: able to maintain static sitting balance at edge of recliner without UE support Balance Overall balance assessment: Needs assistance Sitting-balance support: No upper extremity supported, Feet supported Sitting balance-Leahy Scale: Fair Sitting balance - Comments: able to maintain static sitting balance at edge of recliner without UE support Standing balance support: Single extremity supported, During functional activity Standing balance-Leahy Scale: Poor Standing balance comment: Heavy reliance on LUE support on RW or recliner armrest to maintain static standing; dependent for posterior pericare    Special needs/care consideration    Previous Home Environment  Living Arrangements: Children  Lives With: Daughter Available Help at Discharge: Family, Available PRN/intermittently Type of Home: House Home Layout: Multi-level, Able to live on main level with bedroom/bathroom, 1/2 bath on main level Alternate Level Stairs-Number of Steps: flight, chair lift Home Access: Level entry Bathroom Shower/Tub: Multimedia programmer: Standard Bathroom Accessibility: Yes How Accessible: Accessible via walker Home Care Services: No Additional Comments: Information taken from chart due to patient presenting with expressive aphasia and no family present at time of evaluation. pt also reports living with sister and cousin when asked  Discharge Living Setting Plans for Discharge Living Setting: Lives with (comment) Type of Home at Discharge: House Discharge Home Layout: Two level, 1/2 bath on main level (pt sleeps downstairs but bathes upstairs) Alternate Level Stairs-Rails:  (stair lift) Alternate Level Stairs-Number of Steps: full flight Discharge Home  Access: Stairs to enter, Ramped entrance Entrance Stairs-Rails: None Entrance Stairs-Number of Steps: 1 Discharge Bathroom Shower/Tub: Walk-in shower (threshold) Discharge Bathroom Toilet: Standard (BSC over toilet) Discharge Bathroom Accessibility: Yes How Accessible: Accessible via wheelchair Does the patient have any problems obtaining your medications?: No  Social/Family/Support Systems Anticipated Caregiver: pt's daughter, Lindsey [name] Anticipated Caregiver's Contact Information: [phone] Ability/Limitations of Caregiver: providing care for pt prior to admit Caregiver Availability: 24/7 Discharge Plan Discussed with Primary Caregiver: Yes Is Caregiver In Agreement with Plan?: Yes Does Caregiver/Family  have Issues with Lodging/Transportation while Pt is in Rehab?: No  Goals Patient/Family Goal for Rehab: PT/OT min assist, SLP min to mod assist Expected length of stay: 12-14 Pt/Family Agrees to Admission and willing to participate: Yes Program Orientation Provided & Reviewed with Pt/Caregiver Including Roles  & Responsibilities: Yes  Decrease burden of Care through IP rehab admission: n/a  Possible need for SNF placement upon discharge: not anticipated  Patient Condition: I have reviewed medical records from [facility], spoken with  [facility] team , and patient and daughter. I discussed via phone for inpatient rehabilitation assessment.  Patient will benefit from ongoing PT, OT, and SLP, can actively participate in 3 hours of therapy a day 5 days of the week, and can make measurable gains during the admission.  Patient will also benefit from the coordinated team approach during an Inpatient Acute Rehabilitation admission.  The patient will receive intensive therapy as well as Rehabilitation physician, nursing, social worker, and care management interventions.  Due to bladder management, bowel management, safety, skin/wound care, disease management, medication administration, pain management,  and patient education the patient requires 24 hour a day rehabilitation nursing.  The patient is currently mod assist overall with mobility and basic ADLs.  Discharge setting and therapy post discharge at home with home health is anticipated.  Patient has agreed to participate in the Acute Inpatient Rehabilitation Program and will admit today.  Preadmission Screen Completed By: Shann Medal, PT, DPT  Cleatrice Burke, 10/01/2021 9:36 AM ______________________________________________________________________   Discussed status with Dr. Naaman Plummer on 10/01/21 at [phone] and received approval for admission today.  Admission Coordinator: Shann Medal, PT, DPT and  Cleatrice Burke, RN, time [id number] Date 10/01/21   Assessment/Plan: Diagnosis: cva, pyelonephritis Does the need for close, 24 hr/day Medical supervision in concert with the patient's rehab needs make it unreasonable for this patient to be served in a less intensive setting? Yes Co-Morbidities requiring supervision/potential complications: a fib with RVR, aphasia Due to bladder management, bowel management, safety, skin/wound care, disease management, medication administration, pain management, and patient education, does the patient require 24 hr/day rehab nursing? Yes Does the patient require coordinated care of a physician, rehab nurse, PT, OT, and SLP to address physical and functional deficits in the context of the above medical diagnosis(es)? Yes Addressing deficits in the following areas: balance, endurance, locomotion, strength, transferring, bowel/bladder control, bathing, dressing, feeding, grooming, toileting, cognition, speech, language, swallowing, and psychosocial support Can the patient actively participate in an intensive therapy program of at least 3 hrs of therapy 5 days a week? Yes The potential for patient to make measurable gains while on inpatient rehab is excellent Anticipated functional outcomes upon discharge  from inpatient rehab: min assist PT, min assist OT, min assist and mod assist SLP Estimated rehab length of stay to reach the above functional goals is: 12-14 days Anticipated discharge destination: Home 10. Overall Rehab/Functional Prognosis: excellent   MD Signature: Meredith Staggers, MD, [place] Director Rehabilitation Services 10/01/2021

## 2021-09-28 NOTE — Progress Notes (Addendum)
ANTICOAGULATION CONSULT NOTE - Follow Up Consult  Pharmacy Consult for enoxaparin + warfarin Indication:  Renal artery thrombosis  No Known Allergies  Patient Measurements: Heparin Dosing Weight: 66 kg   Vital Signs: Temp: 99.6 F (37.6 C) (12/27 0348) Temp Source: Axillary (12/27 0348) BP: 133/84 (12/27 0348) Pulse Rate: 103 (12/27 0348)  Labs: Recent Labs    09/26/21 0741 09/26/21 1021 09/26/21 1951 09/27/21 0837 09/27/21 2008 09/28/21 0325  HGB 13.0  --   --  10.1*  --  9.9*  HCT 38.8  --   --  29.3*  --  28.8*  PLT 377  --   --  302  --  300  APTT  --   --   --  92* 50* 78*  LABPROT  --   --   --  24.7*  --   --   INR  --   --   --  2.2*  --   --   HEPARINUNFRC  --   --   --  >1.10*  --  >1.10*  CREATININE QUANTITY NOT SUFFICIENT, UNABLE TO PERFORM TEST  --  1.69* 1.74*  --  1.33*  CKTOTAL QUANTITY NOT SUFFICIENT, UNABLE TO PERFORM TEST  --   --   --   --   --   CKMB QUANTITY NOT SUFFICIENT, UNABLE TO PERFORM TEST  --   --   --   --   --   TROPONINIHS  --  39*  --   --   --   --     Estimated Creatinine Clearance: 39.7 mL/min (A) (by C-G formula based on SCr of 1.33 mg/dL (H)).   Medical History: Past Medical History:  Diagnosis Date   Diverticulosis    Hypertension    Uterine prolapse     Assessment: 59 yoF on apixaban for hx AFib and CVA, found to have acute renal artery thrombosis noted on CT abdomen 12/25. Pt has been on heparin infusion, now to transition to warfarin with enoxaparin bridge.   AKI has improved, CBC stable. INR noted to be 2.2 yesterday, repeat is 1.9 this morning - this likely represents slightly elevated from apixaban as well as   Goal of Therapy:  INR 2-3 Anti-Xa level 0.6-1 units/ml 4hrs after LMWH dose given Monitor platelets by anticoagulation protocol: Yes   Plan:  -Enoxaparin 1mg /kg SQ BID -Warfarin 2mg  PO x1 tonight -Daily INR -Will need to continue warfarin/enoxaparin overlap x5 days regardless of INR given active clot  and baseline elevated INR  , PharmD, BCPS, [facility] Clinical Pharmacist [phone] Please check AMION for all [facility] numbers 09/28/2021

## 2021-09-28 NOTE — Progress Notes (Signed)
ANTICOAGULATION CONSULT NOTE - Follow Up Consult  Pharmacy Consult for heparin Indication:  renal artery thrombosis  Labs: Recent Labs    09/26/21 0741 09/26/21 1021 09/26/21 1951 09/27/21 0837 09/27/21 2008 09/28/21 0325  HGB 13.0  --   --  10.1*  --  9.9*  HCT 38.8  --   --  29.3*  --  28.8*  PLT 377  --   --  302  --  300  APTT  --   --   --  92* 50* 78*  LABPROT  --   --   --  24.7*  --   --   INR  --   --   --  2.2*  --   --   HEPARINUNFRC  --   --   --  >1.10*  --  >1.10*  CREATININE QUANTITY NOT SUFFICIENT, UNABLE TO PERFORM TEST  --  1.69* 1.74*  --  1.33*  CKTOTAL QUANTITY NOT SUFFICIENT, UNABLE TO PERFORM TEST  --   --   --   --   --   CKMB QUANTITY NOT SUFFICIENT, UNABLE TO PERFORM TEST  --   --   --   --   --   TROPONINIHS  --  39*  --   --   --   --     Assessment/Plan:  65yo female therapeutic on heparin after rat change. Will continue infusion at current rate of 950 units/hr and confirm stable with additional level.   Vernard Gambles, PharmD, BCPS  09/28/2021,4:11 AM

## 2021-09-28 NOTE — Plan of Care (Signed)
  Problem: Clinical Measurements: Goal: Ability to maintain clinical measurements within normal limits will improve Outcome: Progressing Goal: Will remain free from infection Outcome: Progressing Goal: Diagnostic test results will improve Outcome: Progressing Goal: Respiratory complications will improve Outcome: Progressing Goal: Cardiovascular complication will be avoided Outcome: Progressing   Problem: Elimination: Goal: Will not experience complications related to bowel motility Outcome: Progressing Goal: Will not experience complications related to urinary retention Outcome: Progressing   Problem: Pain Managment: Goal: General experience of comfort will improve Outcome: Progressing   

## 2021-09-28 NOTE — Hospital Course (Addendum)
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  She was transferred back to IMTS secondary to acute decompensation. During this admission she was transitioned from Eliquis to warfarin with Lovenox bridge secondary to left renal artery thrombosis despite anticoagulation.  Further, antibodies for antiphospholipid syndrome were collected indicated a positive lupus anticoagulant, however these were collected while the patient was on heparin and will need to be repeated in 12 weeks.   #Multifocal pyelonephritis of R kidney Patient was transferred to CIR on 09/24/2021 on Levaquin per ID consultation.  While in CIR, she developed acute fever and septicemia, at which time she was transferred back to MTS and antibiotic coverage was expanded.  Due to concern of worsening of previously noted right kidney abscess, abdominal CT was obtained which showed slight improvement of abscess compared to previous imaging.  Blood cultures showed no growth.  She was treated with IV cefazolin and transitioned to oral cefadroxil at transfer back to CIR per recommendations from infectious disease.   #A. fib with RVR Patient was transferred from CIR due to clinical decline including new A. fib with RVR.  She was managed inpatient with Lopressor 50 mg twice daily and diltiazem drip.  Diltiazem drip was discontinued 12/28, however patient reverted back to RVR and was unable to return to CIR as result.  At this time she was transitioned to diltiazem 45 mg every 6 hours with appropriate rate control.  On discharge, this was changed to diltiazem 180 mg daily.   #Possible seizure Patient was continued on Keppra 500 mg twice daily for possible seizure activity.

## 2021-09-28 NOTE — Evaluation (Signed)
Physical Therapy Evaluation Patient Details Name: Lindsey [name] MRN: [id number] DOB: 11/30/1955 Today's Date: 09/28/2021  History of Present Illness  Pt is a 65 y.o. 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 PMH includes L MCA stroke (residual R-side hemiplegia, aphasia), afib, HTN.   Clinical Impression  Pt presents with an overall decrease in functional mobility secondary to above. Prior to initial admission, pt ambulatory with [facility] and intermittent assist, pt lives with daughter who assists with ADL/iADLs as needed. Today, pt tolerated transfer training with modA; pt limited by generalized weakness, R-side deficits, poor balance and impaired cognition. Pt pleasant and motivated to participate; hopeful for return to CIR for continued rehab. Pt remains an excellent candidate for intensive CIR-level therapies to maximize functional mobility and independence prior to d/c home.      Recommendations for follow up therapy are one component of a multi-disciplinary discharge planning process, led by the attending physician.  Recommendations may be updated based on patient status, additional functional criteria and insurance authorization.  Follow Up Recommendations Acute inpatient rehab (3hours/day)    Assistance Recommended at Discharge Frequent or constant Supervision/Assistance  Functional Status Assessment Patient has had a recent decline in their functional status and demonstrates the ability to make significant improvements in function in a reasonable and predictable amount of time.  Equipment Recommendations   (defer)    Recommendations for Other Services       Precautions / Restrictions Precautions Precautions:  Fall;Other (comment) Precaution Comments: Baseline R-side hemiplegia Required Braces or Orthoses: Other Brace;Splint/Cast Splint/Cast: Baseline RUE resting hand splint Other Brace: Baseline R AFO for ambulation Restrictions Weight Bearing Restrictions: No      Mobility  Bed Mobility Overal bed mobility: Needs Assistance Bed Mobility: Supine to Sit     Supine to sit: Mod assist;HOB elevated     General bed mobility comments: Pt initiating well with LUE/LLE, modA for RLE management and trunk elevation    Transfers Overall transfer level: Needs assistance Equipment used: 1 person hand held assist Transfers: Sit to/from Stand;Bed to chair/wheelchair/BSC Sit to Stand: Mod assist   Step pivot transfers: Mod assist       General transfer comment: ModA for trunk elevation standing from EOB, pivotal steps from bed to recliner pt reaching to armrests for LUE support in order to shift body weight, difficulty taking complete steps with RLE    Ambulation/Gait                  Stairs            Wheelchair Mobility    Modified Rankin (Stroke Patients Only) Modified Rankin (Stroke Patients Only) Pre-Morbid Rankin Score: Moderately severe disability Modified Rankin: Moderately severe disability     Balance Overall balance assessment: Needs assistance Sitting-balance support: No upper extremity supported;Feet supported Sitting balance-Leahy Scale: Fair     Standing balance support: Single extremity supported Standing balance-Leahy Scale: Poor                               Pertinent Vitals/Pain Pain Assessment: No/denies pain Pain Intervention(s): Monitored during session    Home Living Family/patient expects to be discharged to:: Inpatient rehab Living Arrangements: Children Available Help at Discharge: Family;Available PRN/intermittently  Type of Home: House                  Prior Function Prior Level of Function : Needs assist   Cognitive Assist : ADLs (cognitive)   ADLs (Cognitive): Intermittent cues Physical Assist : Mobility (physical);ADLs (physical) Mobility (physical): Gait;Stairs ADLs (physical): Grooming;Bathing;Dressing;Toileting;IADLs Mobility Comments: Baseline function, pt ambulatory with SPC and knee brace; requires assist on carpet due to reduced R foot clearance ADLs Comments: needs assistance with bathing, dressing, grooming, setup for feeding     Hand Dominance   Dominant Hand: Right    Extremity/Trunk Assessment   Upper Extremity Assessment Upper Extremity Assessment: Generalized weakness;RUE deficits/detail RUE Deficits / Details: h/o hemiplegia from h/o CVA (prior to most recent CVA 08/2021), 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    Lower Extremity Assessment Lower Extremity Assessment: Generalized weakness;RLE deficits/detail RLE Deficits / Details: R knee flex/ext and hip flex <3/5; baseline R ankle AFO due to minimal to no ankle DF/PF activation (from h/o stroke)       Communication   Communication: Expressive difficulties;Receptive difficulties  Cognition Arousal/Alertness: Awake/alert Behavior During Therapy: WFL for tasks assessed/performed;Flat affect Overall Cognitive Status: Difficult to assess Area of Impairment: Attention;Following commands;Awareness;Problem solving                   Current Attention Level: Selective   Following Commands: Follows one step commands with increased time   Awareness: Emergent Problem Solving: Slow processing;Decreased initiation;Requires verbal cues;Requires tactile cues General Comments: Pt with dysarthria and aphasia, answering yes/no questions appropriately, able to answer some simple questions when asked and given options (legs up or down), requires repetition with this. Pt with good awareness of transfer set-up, cues for sequencing and safety; pleasant and motivated for OOB        General Comments  General comments (skin integrity, edema, etc.): Pt's grandson Engineer, building services) present and supportive; pt and grandson hopeful for return to CIR although pt wants to go home    Exercises     Assessment/Plan    PT Assessment Patient needs continued PT services  PT Problem List Decreased strength;Decreased activity tolerance;Decreased balance;Decreased mobility;Decreased range of motion;Decreased cognition;Decreased knowledge of use of DME;Decreased safety awareness;Decreased knowledge of precautions;Impaired tone       PT Treatment Interventions DME instruction;Gait training;Functional mobility training;Therapeutic activities;Therapeutic exercise;Balance training;Patient/family education;Wheelchair mobility training;Neuromuscular re-education;Cognitive remediation    PT Goals (Current goals can be found in the Care Plan section)  Acute Rehab PT Goals Patient Stated Goal: Hopeful for return to CIR for rehab PT Goal Formulation: With patient/family Time For Goal Achievement: 10/12/21 Potential to Achieve Goals: Good    Frequency Min 4X/week   Barriers to discharge        Co-evaluation               AM-PAC PT "6 Clicks" Mobility  Outcome Measure Help needed turning from your back to your side while in a flat bed without using bedrails?: A Lot Help needed moving from lying on your back to sitting on the side of a flat bed without using bedrails?: A Lot Help needed moving to and from a bed to a chair (including a wheelchair)?: A Lot Help needed standing up from a chair using your arms (e.g., wheelchair or bedside chair)?: A Lot Help needed to walk in hospital room?: A Lot Help needed climbing 3-5 steps with a railing? : Total 6 Click Score: 11    End of Session Equipment Utilized During  Treatment: Gait belt Activity Tolerance: Patient tolerated treatment well Patient left: in chair;with call bell/phone within reach;with chair alarm set Nurse Communication: Mobility status PT Visit  Diagnosis: Other abnormalities of gait and mobility (R26.89);Muscle weakness (generalized) (M62.81);Other symptoms and signs involving the nervous system (R29.898);Hemiplegia and hemiparesis Hemiplegia - Right/Left: Right Hemiplegia - dominant/non-dominant: Dominant Hemiplegia - caused by: Cerebral infarction    Time: [id number] PT Time Calculation (min) (ACUTE ONLY): 32 min   Charges:   PT Evaluation $PT Eval Moderate Complexity: 1 Mod PT Treatments $Therapeutic Activity: 8-22 mins       Mabeline Caras, PT, DPT Acute Rehabilitation Services  Pager [phone] Office [place] 09/28/2021, 1:04 PM

## 2021-09-29 LAB — CBC
HCT: 29.6 % — ABNORMAL LOW (ref 36.0–46.0)
Hemoglobin: 10.6 g/dL — ABNORMAL LOW (ref 12.0–15.0)
MCH: 29.4 pg (ref 26.0–34.0)
MCHC: 35.8 g/dL (ref 30.0–36.0)
MCV: 82.2 fL (ref 80.0–100.0)
Platelets: 282 10*3/uL (ref 150–400)
RBC: 3.6 MIL/uL — ABNORMAL LOW (ref 3.87–5.11)
RDW: 15.1 % (ref 11.5–15.5)
WBC: 14.1 10*3/uL — ABNORMAL HIGH (ref 4.0–10.5)
nRBC: 0 % (ref 0.0–0.2)

## 2021-09-29 LAB — BETA-2-GLYCOPROTEIN I ABS, IGG/M/A
Beta-2 Glyco I IgG: <9 GPI IgG units (ref 0–20)
Beta-2-Glycoprotein I IgA: 22 GPI IgA units (ref 0–25)
Beta-2-Glycoprotein I IgM: <9 GPI IgM units (ref 0–32)

## 2021-09-29 LAB — COMPREHENSIVE METABOLIC PANEL
ALT: 24 U/L (ref 0–44)
AST: 69 U/L — ABNORMAL HIGH (ref 15–41)
Albumin: 2.2 g/dL — ABNORMAL LOW (ref 3.5–5.0)
Alkaline Phosphatase: 93 U/L (ref 38–126)
Anion gap: 10 (ref 5–15)
BUN: 13 mg/dL (ref 8–23)
CO2: 23 mmol/L (ref 22–32)
Calcium: 8.6 mg/dL — ABNORMAL LOW (ref 8.9–10.3)
Chloride: 98 mmol/L (ref 98–111)
Creatinine, Ser: 1.32 mg/dL — ABNORMAL HIGH (ref 0.44–1.00)
GFR, Estimated: 45 mL/min — ABNORMAL LOW (ref >60)
Glucose, Bld: 134 mg/dL — ABNORMAL HIGH (ref 70–99)
Potassium: 4.6 mmol/L (ref 3.5–5.1)
Sodium: 131 mmol/L — ABNORMAL LOW (ref 135–145)
Total Bilirubin: 1 mg/dL (ref 0.3–1.2)
Total Protein: 7 g/dL (ref 6.5–8.1)

## 2021-09-29 LAB — CARDIOLIPIN ANTIBODIES, IGG, IGM, IGA
Anticardiolipin IgA: 20 U/mL — ABNORMAL HIGH (ref 0–11)
Anticardiolipin IgG: <9 GPL U/mL (ref 0–14)
Anticardiolipin IgM: 10 [MPL'U]/mL (ref 0–12)

## 2021-09-29 LAB — PROTIME-INR
INR: 1.6 — ABNORMAL HIGH (ref 0.8–1.2)
Prothrombin Time: 19 seconds — ABNORMAL HIGH (ref 11.4–15.2)

## 2021-09-29 LAB — PTT-LA MIX: PTT-LA Mix: 63.9 s — ABNORMAL HIGH (ref 0.0–48.9)

## 2021-09-29 LAB — LUPUS ANTICOAGULANT
DRVVT: 148.2 s — ABNORMAL HIGH (ref 0.0–47.0)
PTT Lupus Anticoagulant: 72.4 s — ABNORMAL HIGH (ref 0.0–51.9)
Thrombin Time: 18.3 s (ref 0.0–23.0)
dPT Confirm Ratio: 1.05 Ratio (ref 0.00–1.34)
dPT: 50 s — ABNORMAL HIGH (ref 0.0–47.6)

## 2021-09-29 LAB — DRVVT MIX: dRVVT Mix: 66.6 s — ABNORMAL HIGH (ref 0.0–40.4)

## 2021-09-29 LAB — MAGNESIUM: Magnesium: 1.7 mg/dL (ref 1.7–2.4)

## 2021-09-29 LAB — GLUCOSE, CAPILLARY: Glucose-Capillary: 104 mg/dL — ABNORMAL HIGH (ref 70–99)

## 2021-09-29 LAB — DRVVT CONFIRM: dRVVT Confirm: 1.3 ratio — ABNORMAL HIGH (ref 0.8–1.2)

## 2021-09-29 LAB — HEXAGONAL PHASE PHOSPHOLIPID: Hexagonal Phase Phospholipid: 6 s (ref 0–11)

## 2021-09-29 MED ORDER — DILTIAZEM HCL 60 MG PO TABS: 30.0000 mg | ORAL_TABLET | Freq: Four times a day (QID) | ORAL | Status: DC

## 2021-09-29 MED ORDER — MAGNESIUM SULFATE 2 GM/50ML IV SOLN
2.0000 g | Freq: Once | INTRAVENOUS | Status: AC
  Administered 2021-09-29: 10:00:00 2 g via INTRAVENOUS
  Filled 2021-09-29: qty 50

## 2021-09-29 MED ORDER — DILTIAZEM HCL 90 MG PO TABS
45.0000 mg | ORAL_TABLET | Freq: Four times a day (QID) | ORAL | Status: DC
  Administered 2021-09-29 – 2021-09-30 (×4): 45 mg via ORAL
  Filled 2021-09-29 (×5): qty 0.5

## 2021-09-29 MED ORDER — WARFARIN SODIUM 4 MG PO TABS
4.0000 mg | ORAL_TABLET | Freq: Once | ORAL | Status: DC
  Administered 2021-09-29: 18:00:00 4 mg via ORAL
  Filled 2021-09-29: qty 1

## 2021-09-29 NOTE — Evaluation (Signed)
Occupational Therapy Evaluation Patient Details Name: Lindsey [name] MRN: [id number] DOB: 03/02/1956 Today's Date: 09/29/2021   History of Present Illness Pt is a 65 y.o. 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 PMH includes L MCA stroke (residual R-side hemiplegia, aphasia), afib, HTN.   Clinical Impression   Pt admitted from CIR due to diagnoses noted above with deficits noted in strength, balance, endurance along with baseline R hemiplegia. After CVA, pt typically ambulatory with cane in home, requires assist for LB ADLs primarily but reports typically able to toilet self. Pt reported desire to attempt using BSC with Max A needed for basic transfers. Pt requires overall Mod A for UB ADLs and Max A to Total A for LB ADLs. As pt is below functional baseline, recommend CIR to maximize safety with ADLs/mobility and decrease fall risk.    HR up to 152bpm, a fib   Recommendations for follow up therapy are one component of a multi-disciplinary discharge planning process, led by the attending physician.  Recommendations may be updated based on patient status, additional functional criteria and insurance authorization.   Follow Up Recommendations  Acute inpatient rehab (3hours/day)    Assistance Recommended at Discharge Frequent or constant Supervision/Assistance  Functional Status Assessment  Patient has had a recent decline in their functional status and demonstrates the ability to make significant improvements in function in a reasonable and predictable amount of time.  Equipment Recommendations  None recommended by OT    Recommendations for Other Services Rehab consult     Precautions / Restrictions  Precautions Precautions: Fall;Other (comment) Precaution Comments: Baseline R-side hemiplegia; watch HR Required Braces or Orthoses: Other Brace;Splint/Cast Splint/Cast: Baseline RUE resting hand splint Other Brace: Baseline R AFO for ambulation Restrictions Weight Bearing Restrictions: No      Mobility Bed Mobility Overal bed mobility: Needs Assistance Bed Mobility: Supine to Sit     Supine to sit: Mod assist;HOB elevated     General bed mobility comments: heavy assist to pull trunk forward, able to assist with BLE    Transfers Overall transfer level: Needs assistance Equipment used: 1 person hand held assist Transfers: Sit to/from Stand;Bed to chair/wheelchair/BSC Sit to Stand: Mod assist Stand pivot transfers: Max assist         General transfer comment: Mod A to power up with therapist in front of pt, pt reaching up to therapist's shoulder/neck but cued to hold behind elbow for safety. Max A for pivoting to [facility] and recliner with physical assist to advance R LE and maintain balance while pt manuevering L LE      Balance Overall balance assessment: Needs assistance Sitting-balance support: No upper extremity supported;Feet supported Sitting balance-Leahy Scale: Fair Sitting balance - Comments: initially poor sitting balance but able to gain balance after sitting EOB > 2 min   Standing balance support: Single extremity supported Standing balance-Leahy Scale: Poor                             ADL either performed or assessed with clinical judgement   ADL Overall ADL's : Needs assistance/impaired     Grooming: Minimal assistance;Sitting   Upper Body Bathing: Moderate assistance;Sitting       Upper Body Dressing : Moderate assistance;Sitting  Lower Body Dressing: Maximal assistance;Sit to/from stand Lower Body Dressing Details (indicate cue type and reason): to don socks; has assist with this at baseline Toilet Transfer: Maximal  assistance;Stand-pivot;BSC/3in1 Toilet Transfer Details (indicate cue type and reason): assist to advance R LE (pt reported only using AFO for mobility at home but did not need for transfers; unsure of accuracy)                 Vision Baseline Vision/History: 1 Wears glasses Ability to See in Adequate Light: 0 Adequate Patient Visual Report: No change from baseline Vision Assessment?: No apparent visual deficits     Perception     Praxis      Pertinent Vitals/Pain Pain Assessment: Faces Faces Pain Scale: Hurts a little bit Pain Location: bottom on BSC Pain Descriptors / Indicators: Grimacing;Guarding Pain Intervention(s): Monitored during session;Limited activity within patient's tolerance     Hand Dominance Right   Extremity/Trunk Assessment Upper Extremity Assessment Upper Extremity Assessment: RUE deficits/detail RUE Deficits / Details: h/o hemiplegia from h/o CVA (prior to most recent CVA 08/2021), 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 though unable to specify wearing schedule RUE Coordination: decreased fine motor;decreased gross motor   Lower Extremity Assessment Lower Extremity Assessment: Defer to PT evaluation   Cervical / Trunk Assessment Cervical / Trunk Assessment: Normal   Communication Communication Communication: Expressive difficulties;Receptive difficulties   Cognition Arousal/Alertness: Awake/alert Behavior During Therapy: WFL for tasks assessed/performed;Flat affect Overall Cognitive Status: Difficult to assess Area of Impairment: Attention;Following commands;Awareness;Problem solving;Memory;Safety/judgement                   Current Attention Level: Selective Memory: Decreased short-term memory Following Commands: Follows one step commands with increased time Safety/Judgement: Decreased awareness of safety;Decreased awareness of deficits Awareness: Emergent Problem Solving: Slow processing;Decreased  initiation;Requires verbal cues;Requires tactile cues General Comments: Pt with dysarthria and aphasia, answering yes/no questions appropriately, able to answer some simple questions when asked and given options, requires repetition with this. Requires safety cues (hand placement, etc)     General Comments  HR a fib  up to 152bpm, down to low 100s with rest break    Exercises     Shoulder Instructions      Home Living Family/patient expects to be discharged to:: Inpatient rehab Living Arrangements: Children Available Help at Discharge: Family;Available PRN/intermittently Type of Home: House Home Access: Level entry     Home Layout: Multi-level;Able to live on main level with bedroom/bathroom;1/2 bath on main level Alternate Level Stairs-Number of Steps: flight, chair lift   Bathroom Shower/Tub: Walk-in shower   Bathroom Toilet: Standard Bathroom Accessibility: Yes How Accessible: Accessible via walker Home Equipment: Shower seat;Wheelchair - manual;Cane - single Librarian, academic (2 wheels);Other (comment);Hospital bed   Additional Comments: Information taken from chart due to patient presenting with expressive aphasia and no family present at time of evaluation. pt also reports living with sister and cousin when asked      Prior Functioning/Environment Prior Level of Function : Needs assist  Cognitive Assist : ADLs (cognitive)   ADLs (Cognitive): Intermittent cues Physical Assist : Mobility (physical);ADLs (physical) Mobility (physical): Gait;Stairs ADLs (physical): Grooming;Bathing;Dressing;Toileting;IADLs Mobility Comments: Baseline function, pt ambulatory with SPC and knee brace; requires assist on carpet due to reduced R foot clearance ADLs Comments: requires assist for LB dressing/bathing, bimanual grooming tasks, and brace mgmt        OT Problem List: Decreased activity tolerance;Impaired balance (sitting and/or standing);Decreased safety awareness;Decreased  knowledge of use of  DME or AE      OT Treatment/Interventions: Self-care/ADL training;Therapeutic exercise;Energy conservation;DME and/or AE instruction;Therapeutic activities;Patient/family education;Balance training    OT Goals(Current goals can be found in the care plan section) Acute Rehab OT Goals Patient Stated Goal: go home OT Goal Formulation: With patient Time For Goal Achievement: 10/12/21 Potential to Achieve Goals: Good ADL Goals Pt Will Transfer to Toilet: with min guard assist;stand pivot transfer;bedside commode Pt Will Perform Toileting - Clothing Manipulation and hygiene: with min assist;sit to/from stand;sitting/lateral leans Additional ADL Goal #1: Pt to complete bed mobility at min guard level in prep for ADLs  OT Frequency: Min 2X/week   Barriers to D/C:            Co-evaluation              AM-PAC OT "6 Clicks" Daily Activity     Outcome Measure Help from another person eating meals?: A Little Help from another person taking care of personal grooming?: A Little Help from another person toileting, which includes using toliet, bedpan, or urinal?: Total Help from another person bathing (including washing, rinsing, drying)?: A Lot Help from another person to put on and taking off regular upper body clothing?: A Little Help from another person to put on and taking off regular lower body clothing?: A Lot 6 Click Score: 14   End of Session Equipment Utilized During Treatment: Gait belt Nurse Communication: Mobility status  Activity Tolerance: Patient tolerated treatment well Patient left: in chair;with call bell/phone within reach;with chair alarm set (only chair pad in room, unable to locate nurse call cord to plug in; RN aware)  OT Visit Diagnosis: Other abnormalities of gait and mobility (R26.89);Muscle weakness (generalized) (M62.81);Other symptoms and signs involving cognitive function                Time: [id number] OT Time Calculation (min): 33  min Charges:  OT General Charges $OT Visit: 1 Visit OT Evaluation $OT Eval Moderate Complexity: 1 Mod OT Treatments $Self Care/Home Management : 8-22 mins  Malachy Chamber, OTR/L Acute Rehab Services Office: [phone]   Layla Maw 09/29/2021, 9:36 AM

## 2021-09-29 NOTE — Progress Notes (Signed)
ANTICOAGULATION CONSULT NOTE - Follow Up Consult  Pharmacy Consult for enoxaparin + warfarin Indication:  Renal artery thrombosis  No Known Allergies  Patient Measurements: Heparin Dosing Weight: 66 kg   Vital Signs: Temp: 98.5 F (36.9 C) (12/28 0345) Temp Source: Oral (12/28 0345) BP: 137/77 (12/28 0345) Pulse Rate: 88 (12/28 0345)  Labs: Recent Labs    09/26/21 1021 09/26/21 1951 09/27/21 0837 09/27/21 2008 09/28/21 0325 09/29/21 0155  HGB  --    < > 10.1*  --  9.9* 10.6*  HCT  --   --  29.3*  --  28.8* 29.6*  PLT  --   --  302  --  300 282  APTT  --   --  92* 50* 78*  --   LABPROT  --   --  24.7*  --  21.7* 19.0*  INR  --   --  2.2*  --  1.9* 1.6*  HEPARINUNFRC  --   --  >1.10*  --  >1.10*  --   CREATININE  --    < > 1.74*  --  1.33* 1.32*  TROPONINIHS 39*  --   --   --   --   --    < > = values in this interval not displayed.    Estimated Creatinine Clearance: 40 mL/min (A) (by C-G formula based on SCr of 1.32 mg/dL (H)).   Medical History: Past Medical History:  Diagnosis Date   Diverticulosis    Hypertension    Uterine prolapse     Assessment: 42 yoF on apixaban for hx AFib and CVA, found to have acute renal artery thrombosis noted on CT abdomen 12/25. Pt has been on heparin infusion, now to transition to warfarin with enoxaparin bridge with concerns for underlying APS.  INR 1.9 yesterday prior to warfarin start likely due to influence of apixaban and underlying mild hepatic dysfunction. Warfarin started at low dose and INR down to 1.6. CBC remains stable on enoxaparin.  Goal of Therapy:  INR 2-3 Anti-Xa level 0.6-1 units/ml 4hrs after LMWH dose given Monitor platelets by anticoagulation protocol: Yes   Plan:  -Enoxaparin 1mg /kg SQ BID -Warfarin 4mg  PO x1 tonight -Daily INR   , PharmD, BCPS, [facility] Clinical Pharmacist [phone] Please check AMION for all [facility] Pharmacy numbers 09/29/2021

## 2021-09-29 NOTE — Plan of Care (Signed)

## 2021-09-29 NOTE — Progress Notes (Signed)
Physical Therapy Treatment Patient Details Name: Lindsey [name] MRN: [id number] DOB: [birth year] Today's Date: 09/29/2021   History of Present Illness Pt is a 65 y.o. 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 PMH includes L MCA stroke (residual R-side hemiplegia, aphasia), afib, HTN.   PT Comments    Pt progressing with mobility. Pt received unaware of urine incontinence (purewick malfunction), session focused on standing for washup, LE therex and repositioning to eat dinner. Pt requiring mod-maxA for transfers, dependent for standing ADL task. Pt remains limited by generalized weakness, R-side deficits, poor balance and impaired cognition; pt with increased flat affect this session. Continue to recommend return to CIR for intensive therapies to maximize functional mobility and independence prior to d/c home.  HR 70s-90s     Recommendations for follow up therapy are one component of a multi-disciplinary discharge planning process, led by the attending physician.  Recommendations may be updated based on patient status, additional functional criteria and insurance authorization.  Follow Up Recommendations  Acute inpatient rehab (3hours/day)     Assistance Recommended at Discharge Frequent or constant Supervision/Assistance  Equipment Recommendations   (defer)    Recommendations for Other Services       Precautions / Restrictions Precautions Precautions: Fall;Other (comment) Precaution Comments: Baseline R-side hemiplegia; urine incontinence Required Braces or Orthoses: Other Brace;Splint/Cast Splint/Cast: Baseline RUE resting hand splint Other Brace: Baseline R AFO for ambulation Restrictions Weight Bearing  Restrictions: No     Mobility  Bed Mobility               General bed mobility comments: Received sitting in recliner; pt declined transfer back to bed    Transfers Overall transfer level: Needs assistance Equipment used: 1 person hand held assist Transfers: Sit to/from Stand Sit to Stand: Mod assist           General transfer comment: Heavy modA for trunk elevation on R-side for 2x standing from recliner, repeated cues for LUE hand placement; pt declined R AFO donned for standing, noted R foot sliding with knee hyperextension upon standing    Ambulation/Gait                   Stairs             Wheelchair Mobility    Modified Rankin (Stroke Patients Only)       Balance Overall balance assessment: Needs assistance Sitting-balance support: No upper extremity supported;Feet supported Sitting balance-Leahy Scale: Fair Sitting balance - Comments: able to maintain static sitting balance at edge of recliner without UE support   Standing balance support: Single extremity supported;During functional activity   Standing balance comment: Heavy reliance on LUE support on RW or recliner armrest to maintain static standing; dependent for posterior pericare                            Cognition Arousal/Alertness: Awake/alert Behavior During Therapy: Flat affect Overall Cognitive Status: Difficult to assess                                 General Comments: pt with increased flat affect and minimal verbalizations compared to yesterday's session (seems to be  related to pt wanting to d/c home instead of CIR); becoming somewhat more interactive as session continued. requires frequent cues for sequencing with transfers. H/o dysarthria and aphasia        Exercises General Exercises - Lower Extremity Long Arc Quad: AAROM;Right;AROM;Left;Seated    General Comments General comments (skin integrity, edema, etc.): Pt reports unaware of  urine incontinence/feeling wet (Purewick not working fully); dependent for posterior pericare in standing, pt able to assist with anterior pericare but required cues to complete fully. Assist for meal set-up and repositioning to allow upright posture to eat. HR 70s-90s during session      Pertinent Vitals/Pain Pain Assessment: No/denies pain Pain Intervention(s): Monitored during session    Home Living                          Prior Function            PT Goals (current goals can now be found in the care plan section) Progress towards PT goals: Progressing toward goals    Frequency    Min 4X/week      PT Plan Current plan remains appropriate    Co-evaluation              AM-PAC PT "6 Clicks" Mobility   Outcome Measure  Help needed turning from your back to your side while in a flat bed without using bedrails?: A Lot Help needed moving from lying on your back to sitting on the side of a flat bed without using bedrails?: A Lot Help needed moving to and from a bed to a chair (including a wheelchair)?: A Lot Help needed standing up from a chair using your arms (e.g., wheelchair or bedside chair)?: A Lot Help needed to walk in hospital room?: A Lot Help needed climbing 3-5 steps with a railing? : Total 6 Click Score: 11    End of Session Equipment Utilized During Treatment: Gait belt Activity Tolerance: Patient tolerated treatment well;Patient limited by fatigue Patient left: in chair;with call bell/phone within reach;with chair alarm set Nurse Communication: Mobility status;Other (comment) (need for new purewick; pt washed up) PT Visit Diagnosis: Other abnormalities of gait and mobility (R26.89);Muscle weakness (generalized) (M62.81);Other symptoms and signs involving the nervous system (R29.898);Hemiplegia and hemiparesis Hemiplegia - Right/Left: Right Hemiplegia - dominant/non-dominant: Dominant Hemiplegia - caused by: Cerebral infarction     Time:  [id number] PT Time Calculation (min) (ACUTE ONLY): 31 min  Charges:  $Therapeutic Activity: 23-37 mins                     Mabeline Caras, PT, DPT Acute Rehabilitation Services  Pager [phone] Office [place] 09/29/2021, 5:26 PM

## 2021-09-29 NOTE — Progress Notes (Signed)
Inpatient Rehabilitation Admissions Coordinator   Heart rate up to 152 with OT activity this morning . I met with patient at bedside with her grandson and spoke with her daughter by phone. Patient not yet ready to readmit to CIR until hear rate managed with activity. I updated Dr Ranell Patrick this morning. Patient is hopeful for discharge home and daughter made aware that she is questioning readmit to CIR. I will follow up tomorrow.  Danne Baxter, RN, MSN Rehab Admissions Coordinator [phone] 09/29/2021 10:44 AM

## 2021-09-29 NOTE — H&P (Incomplete Revision)
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  She presented to the emergency department on September 19, 2021 with new onset of nausea, vomiting diaphoresis as well as change in baseline mental status as reported by her family.  She had no report of loss of consciousness or seizure type activity.  No known fever or chills.  As part of her work-up, she underwent CT scan of the abdomen pelvis which revealed multifocal pyelonephritis of the right kidney.  Urology was contacted and Dr. Jeffie Pollock reviewed the CT scan and the patient's data.  He recommended infectious disease consultation.  She was started on empiric antibiotics.  Her urine was positive for Klebsiella.  She was transitioned to oral Levaquin on 1219. On admission, CT scan of the head was also performed which was negative.  Due to worsening speech, MRI was performed on 12/21 which revealed an acute moderate sized left AICA cerebellar infarct.  Underlying extensive chronic left MCA territory encephalomalacia related to the 2021 infarct was noted.  Associated left brainstem wallerian degeneration since last year was noted.  Small superimposed chronic left PICA cerebellar infarcts were new since last year.  She developed elevated heart rate, decreased responsiveness and foaming at her mouth on 12/22 and rapid response was called.  Neurology evaluated and Keppra load of 1500 mg IV was given and she was started on Keppra 500 mg twice a day.  Aspirin 81 mg was added.  2D echo was completed and was negative.  Ejection fraction was estimated at 50 to 55%.  Patient was admitted to physical medicine and rehab inpatient services on September 24, 2021.  On the evening of 12/24, she developed mild tachycardia and fever to 102.5.  Lab work was completed at her  repeat white blood cell count revealed elevation to 26,000.  His antibiotics were broadened to include vancomycin and Zosyn.  IV fluids are started for volume support.  On 12/25, repeat CT of the abdomen pelvis were performed.  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  EKG revealed a heart rate of 139 with rapid ventricular response..  Lopressor was given intravenously.  These findings and the case were discussed with internal medicine and the patient was transferred from inpatient rehab to acute care internal medicine service.  She was started on diltiazem infusion. Eliquis was discontinued and she was started on heparin infusion.  As she was maintained on Eliquis throughout her admission to CIR, concern for antiphospholipid antibody syndrome was considered.  Work-up with cardiolipin and beta-2 glycoprotein antibodies are pending.  Lupus anticoagulant is also pending however this may not be reliable since she was receiving heparin infusion.  She remains in A. fib on review of telemetry but her rates are overall improved.  She was started on Lovenox 65 mg every 12 hours on 12/27.  Her first dose of warfarin 2 mg was administered on 12/27 and 1645 hrs. pharmacy continues to manage Lovenox and warfarin dosing.  Lovenox at 1 mg/kg twice a day we will continue and plan is to discontinue tomorrow 12/31 if her INR remains greater than 2.  Continue daily INR.  Infectious disease consultation was obtained by Dr. Carlyle Basques. Antibiotics were narrowed back to cefazolin since  no new source of infection was found.  Her white blood cell count improved.  She has been afebrile for approximately 48 hours.  Laboratory work-up revealed acute kidney injury with increase in serum creatinine from 0.79 on 12/23 to 1.61 on 12/24.  Trended upward to 1.74 on 12/26.  Today, her serum creatinine is 1.32.  She had 1800  cc of urine output yesterday.  Patient requires inpatient medicine and rehabilitation evaluations and services for ongoing dysfunction secondary to cerebellar stroke.  She lives at home with her daughter who provides care.   Review of Systems  Unable to perform ROS: Patient nonverbal  Past Medical History:  Diagnosis Date   Diverticulosis    Hypertension    Uterine prolapse    No past surgical history on file. Family History  Problem Relation Age of Onset   Breast cancer Neg Hx    Social History:  reports that she has never smoked. She has never used smokeless tobacco. She reports that she does not currently use alcohol. She reports that she does not use drugs. Allergies: No Known Allergies Medications Prior to Admission  Medication Sig Dispense Refill   acetaminophen (TYLENOL) 500 MG tablet Take 500 mg by mouth every 6 (six) hours as needed for moderate pain.     amLODipine (NORVASC) 5 MG tablet Take 1 tablet (5 mg total) by mouth daily. (Patient not taking: Reported on 09/19/2021) 30 tablet 3   apixaban (ELIQUIS) 5 MG TABS tablet Take 1 tablet (5 mg total) by mouth 2 (two) times daily. 180 tablet 0   aspirin EC 81 MG EC tablet Take 1 tablet (81 mg total) by mouth daily. Swallow whole. 30 tablet 0   atorvastatin (LIPITOR) 20 MG tablet Take 1 tablet (20 mg total) by mouth daily at 6 PM. 90 tablet 3   levETIRAcetam (KEPPRA) 500 MG tablet Take 1 tablet (500 mg total) by mouth every 12 (twelve) hours. 60 tablet 0   levofloxacin (LEVAQUIN) 750 MG tablet Take 1 tablet (750 mg total) by mouth daily for 27 days. 27 tablet 0   metoprolol tartrate (LOPRESSOR) 50 MG tablet Take 1 tablet (50 mg total) by mouth 2 (two) times daily. 60 tablet 0   Multiple Vitamins-Minerals (MULTIVITAMIN ADULTS 50+) TABS Take 1 tablet by mouth daily.     ondansetron (ZOFRAN) 4 MG tablet Take 1 tablet (4 mg total) by mouth every 12 (twelve) hours as needed for nausea or vomiting. 20 tablet 0    Drug Regimen  Review  Drug regimen was reviewed and remains appropriate with no significant issues identified  Home: Home Living Family/patient expects to be discharged to:: Inpatient rehab Living Arrangements: Children Available Help at Discharge: Family, Available PRN/intermittently Type of Home: House  Lives With: Daughter   Functional History: Prior Function Prior Level of Function : Needs assist  Cognitive Assist : ADLs (cognitive) ADLs (Cognitive): Intermittent cues Physical Assist : Mobility (physical), ADLs (physical) Mobility (physical): Gait, Stairs ADLs (physical): Grooming, Bathing, Dressing, Toileting, IADLs Mobility Comments: Baseline function, pt ambulatory with SPC and knee brace; requires assist on carpet due to reduced R foot clearance ADLs Comments: needs assistance with bathing, dressing, grooming, setup for feeding  Functional Status:  Mobility: Bed Mobility Overal bed mobility: Needs Assistance Bed Mobility: Supine to Sit Supine to sit: Mod assist, HOB elevated General bed mobility comments: Pt initiating well with LUE/LLE, modA for RLE management and trunk elevation Transfers Overall transfer level: Needs assistance Equipment used: 1 person hand held assist Transfers:  Sit to/from Stand, Bed to chair/wheelchair/BSC Sit to Stand: Mod assist Step pivot transfers: Mod assist General transfer comment: ModA for trunk elevation standing from EOB, pivotal steps from bed to recliner pt reaching to armrests for LUE support in order to shift body weight, difficulty taking complete steps with RLE      ADL:    Cognition: Cognition Overall Cognitive Status: Difficult to assess Orientation Level: Oriented to person, Oriented to place, Disoriented to time, Disoriented to situation Cognition Arousal/Alertness: Awake/alert Behavior During Therapy: WFL for tasks assessed/performed, Flat affect Overall Cognitive Status: Difficult to assess Area of Impairment: Attention,  Following commands, Awareness, Problem solving Current Attention Level: Selective Following Commands: Follows one step commands with increased time Awareness: Emergent Problem Solving: Slow processing, Decreased initiation, Requires verbal cues, Requires tactile cues General Comments: Pt with dysarthria and aphasia, answering yes/no questions appropriately, able to answer some simple questions when asked and given options (legs up or down), requires repetition with this. Pt with good awareness of transfer set-up, cues for sequencing and safety; pleasant and motivated for OOB Difficult to assess due to: Impaired communication  Physical Exam: Blood pressure 137/77, pulse 88, temperature 98.5 F (36.9 C), temperature source Oral, resp. rate (!) 26, SpO2 96 %. Physical Exam Constitutional:      General: She is not in acute distress.    Appearance: She is not ill-appearing.  HENT:     Head: Normocephalic.     Right Ear: External ear normal.     Left Ear: External ear normal.     Nose: Nose normal.     Mouth/Throat:     Mouth: Mucous membranes are moist.  Eyes:     General:        Right eye: No discharge.        Left eye: No discharge.  Cardiovascular:     Rate and Rhythm: Normal rate. Rhythm irregular.     Heart sounds: No murmur heard.   No gallop.  Pulmonary:     Effort: Pulmonary effort is normal. No respiratory distress.     Breath sounds: No wheezing.  Abdominal:     General: There is no distension.     Tenderness: There is no abdominal tenderness.  Musculoskeletal:     Cervical back: Normal range of motion.  Neurological:     Mental Status: She is alert.     Comments: Pt is alert. Follows simple basic commands. Nods head "yes/no" to simple questions. Exp>rec aphasia. Did not engage much however during exam due to mood. RUE tr/5. RLE 1-2/5 prox to 0-trace distally (poor effort). Moves left arm and leg freely. Decreased pain sense Right arm and leg. Does have resting flexor  tone right wrist and elbow.   Psychiatric:     Comments: Irritable, didn't want to make eye contact.     Results for orders placed or performed during the hospital encounter of 09/26/21 (from the past 48 hour(s))  Comprehensive metabolic panel     Status: Abnormal   Collection Time: 09/27/21  8:37 AM  Result Value Ref Range   Sodium 136 135 - 145 mmol/L   Potassium 3.8 3.5 - 5.1 mmol/L   Chloride 102 98 - 111 mmol/L   CO2 23 22 - 32 mmol/L   Glucose, Bld 101 (H) 70 - 99 mg/dL    Comment: Glucose reference range applies only to samples taken after fasting for at least 8 hours.   BUN 17 8 - 23 mg/dL   Creatinine, Ser  1.74 (H) 0.44 - 1.00 mg/dL   Calcium 8.9 8.9 - 10.3 mg/dL   Total Protein 7.7 6.5 - 8.1 g/dL   Albumin 2.4 (L) 3.5 - 5.0 g/dL   AST 71 (H) 15 - 41 U/L   ALT 32 0 - 44 U/L   Alkaline Phosphatase 75 38 - 126 U/L   Total Bilirubin 1.1 0.3 - 1.2 mg/dL   GFR, Estimated 32 (L) >60 mL/min    Comment: (NOTE) Calculated using the CKD-EPI Creatinine Equation (2021)    Anion gap 11 5 - 15    Comment: Performed at [facility] [address], [place], [place] [address]  Protime-INR     Status: Abnormal   Collection Time: 09/27/21  8:37 AM  Result Value Ref Range   Prothrombin Time 24.7 (H) 11.4 - 15.2 seconds   INR 2.2 (H) 0.8 - 1.2    Comment: (NOTE) INR goal varies based on device and disease states. Performed at [facility], [place] [address], [place], [place] [address]   APTT     Status: Abnormal   Collection Time: 09/27/21  8:37 AM  Result Value Ref Range   aPTT 92 (H) 24 - 36 seconds    Comment:        IF BASELINE aPTT IS ELEVATED, SUGGEST PATIENT RISK ASSESSMENT BE USED TO DETERMINE APPROPRIATE ANTICOAGULANT THERAPY. Performed at [facility], [place] [address] [place], [place] [address]   CBC     Status: Abnormal   Collection Time: 09/27/21  8:37 AM  Result Value Ref Range   WBC 26.3 (H) 4.0 - 10.5 K/uL   RBC 3.43 (L) 3.87 - 5.11 MIL/uL    Hemoglobin 10.1 (L) 12.0 - 15.0 g/dL   HCT 29.3 (L) 36.0 - 46.0 %   MCV 85.4 80.0 - 100.0 fL   MCH 29.4 26.0 - 34.0 pg   MCHC 34.5 30.0 - 36.0 g/dL   RDW 15.2 11.5 - 15.5 %   Platelets 302 150 - 400 K/uL   nRBC 0.0 0.0 - 0.2 %    Comment: Performed at [facility], [place] [address] [place], [place] [address]  Lactic acid, plasma     Status: Abnormal   Collection Time: 09/27/21  8:37 AM  Result Value Ref Range   Lactic Acid, Venous 2.0 (HH) 0.5 - 1.9 mmol/L    Comment: CRITICAL VALUE NOTED.  VALUE IS CONSISTENT WITH PREVIOUSLY REPORTED AND CALLED VALUE. Performed at [facility], [place] [address], [place], [place] [address]   Heparin level (unfractionated)     Status: Abnormal   Collection Time: 09/27/21  8:37 AM  Result Value Ref Range   Heparin Unfractionated >1.10 (H) 0.30 - 0.70 IU/mL    Comment: (NOTE) The clinical reportable range upper limit is being lowered to >1.10 to align with the FDA approved guidance for the current laboratory assay.  If heparin results are below expected values, and patient dosage has  been confirmed, suggest follow up testing of antithrombin III levels. Performed at [facility], [place] [address] [place],  [address]   Urinalysis, Routine w reflex microscopic     Status: Abnormal   Collection Time: 09/27/21  2:19 PM  Result Value Ref Range   Color, Urine YELLOW YELLOW   APPearance CLEAR CLEAR   Specific Gravity, Urine 1.025 1.005 - 1.030   pH 6.0 5.0 - 8.0   Glucose, UA NEGATIVE NEGATIVE mg/dL   Hgb urine  dipstick LARGE (A) NEGATIVE   Bilirubin Urine NEGATIVE NEGATIVE   Ketones, ur NEGATIVE NEGATIVE mg/dL   Protein, ur 100 (A) NEGATIVE mg/dL   Nitrite NEGATIVE NEGATIVE   Leukocytes,Ua NEGATIVE NEGATIVE    Comment: Performed at [facility] [address], [place], [place] [address]  Urinalysis, Microscopic (reflex)     Status: Abnormal   Collection Time: 09/27/21  2:19 PM  Result Value Ref Range   RBC / HPF >50  0 - 5 RBC/hpf   WBC, UA 0-5 0 - 5 WBC/hpf   Bacteria, UA FEW (A) NONE SEEN   Squamous Epithelial / LPF 0-5 0 - 5    Comment: Performed at [facility], [place] [address] [place], [place] [address]  APTT     Status: Abnormal   Collection Time: 09/27/21  8:08 PM  Result Value Ref Range   aPTT 50 (H) 24 - 36 seconds    Comment:        IF BASELINE aPTT IS ELEVATED, SUGGEST PATIENT RISK ASSESSMENT BE USED TO DETERMINE APPROPRIATE ANTICOAGULANT THERAPY. Performed at [facility], [place] [address], [place], [place] [id number]   Basic metabolic panel     Status: Abnormal   Collection Time: 09/28/21  3:25 AM  Result Value Ref Range   Sodium 133 (L) 135 - 145 mmol/L   Potassium 3.7 3.5 - 5.1 mmol/L   Chloride 102 98 - 111 mmol/L   CO2 21 (L) 22 - 32 mmol/L   Glucose, Bld 113 (H) 70 - 99 mg/dL    Comment: Glucose reference range applies only to samples taken after fasting for at least 8 hours.   BUN 14 8 - 23 mg/dL   Creatinine, Ser 1.33 (H) 0.44 - 1.00 mg/dL   Calcium 8.7 (L) 8.9 - 10.3 mg/dL   GFR, Estimated 44 (L) >60 mL/min    Comment: (NOTE) Calculated using the CKD-EPI Creatinine Equation (2021)    Anion gap 10 5 - 15    Comment: Performed at [facility] [address] [place], [place] [address]  APTT     Status: Abnormal   Collection Time: 09/28/21  3:25 AM  Result Value Ref Range   aPTT 78 (H) 24 - 36 seconds    Comment:        IF BASELINE aPTT IS ELEVATED, SUGGEST PATIENT RISK ASSESSMENT BE USED TO DETERMINE APPROPRIATE ANTICOAGULANT THERAPY. Performed at [facility], [place] [address] [place], [place] [address]   Heparin level (unfractionated)     Status: Abnormal   Collection Time: 09/28/21  3:25 AM  Result Value Ref Range   Heparin Unfractionated >1.10 (H) 0.30 - 0.70 IU/mL    Comment: (NOTE) The clinical reportable range upper limit is being lowered to >1.10 to align with the FDA approved guidance for the current laboratory assay.  If  heparin results are below expected values, and patient dosage has  been confirmed, suggest follow up testing of antithrombin III levels. Performed at [facility], [place] [address] [place] [address]   CBC     Status: Abnormal   Collection Time: 09/28/21  3:25 AM  Result Value Ref Range   WBC 19.5 (H) 4.0 - 10.5 K/uL   RBC 3.47 (L) 3.87 - 5.11 MIL/uL   Hemoglobin 9.9 (L) 12.0 - 15.0 g/dL   HCT 28.8 (L) 36.0 - 46.0 %   MCV 83.0 80.0 - 100.0 fL   MCH 28.5 26.0 -  34.0 pg   MCHC 34.4 30.0 - 36.0 g/dL   RDW 15.1 11.5 - 15.5 %   Platelets 300 150 - 400 K/uL   nRBC 0.0 0.0 - 0.2 %    Comment: Performed at [facility], [place] [address], [place], [place] [address]  Protime-INR     Status: Abnormal   Collection Time: 09/28/21  3:25 AM  Result Value Ref Range   Prothrombin Time 21.7 (H) 11.4 - 15.2 seconds   INR 1.9 (H) 0.8 - 1.2    Comment: (NOTE) INR goal varies based on device and disease states. Performed at [facility], [place] [address], [place], [place] [address]   Glucose, capillary     Status: Abnormal   Collection Time: 09/28/21  6:41 AM  Result Value Ref Range   Glucose-Capillary 125 (H) 70 - 99 mg/dL    Comment: Glucose reference range applies only to samples taken after fasting for at least 8 hours.   Comment 1 Notify RN    Comment 2 Document in Chart   CBC     Status: Abnormal   Collection Time: 09/29/21  1:55 AM  Result Value Ref Range   WBC 14.1 (H) 4.0 - 10.5 K/uL   RBC 3.60 (L) 3.87 - 5.11 MIL/uL   Hemoglobin 10.6 (L) 12.0 - 15.0 g/dL   HCT 29.6 (L) 36.0 - 46.0 %   MCV 82.2 80.0 - 100.0 fL   MCH 29.4 26.0 - 34.0 pg   MCHC 35.8 30.0 - 36.0 g/dL   RDW 15.1 11.5 - 15.5 %   Platelets 282 150 - 400 K/uL   nRBC 0.0 0.0 - 0.2 %    Comment: Performed at [facility], [place] [address], [place], [place] [address]  Magnesium     Status: None   Collection Time: 09/29/21  1:55 AM  Result Value Ref Range   Magnesium 1.7 1.7 - 2.4 mg/dL     Comment: Performed at [facility] [address] [place], [place] [address]  Protime-INR     Status: Abnormal   Collection Time: 09/29/21  1:55 AM  Result Value Ref Range   Prothrombin Time 19.0 (H) 11.4 - 15.2 seconds   INR 1.6 (H) 0.8 - 1.2    Comment: (NOTE) INR goal varies based on device and disease states. Performed at [facility], [place] [address] [place], [place] [address]   Comprehensive metabolic panel     Status: Abnormal   Collection Time: 09/29/21  1:55 AM  Result Value Ref Range   Sodium 131 (L) 135 - 145 mmol/L   Potassium 4.6 3.5 - 5.1 mmol/L    Comment: DELTA CHECK NOTED   Chloride 98 98 - 111 mmol/L   CO2 23 22 - 32 mmol/L   Glucose, Bld 134 (H) 70 - 99 mg/dL    Comment: Glucose reference range applies only to samples taken after fasting for at least 8 hours.   BUN 13 8 - 23 mg/dL   Creatinine, Ser 1.32 (H) 0.44 - 1.00 mg/dL   Calcium 8.6 (L) 8.9 - 10.3 mg/dL   Total Protein 7.0 6.5 - 8.1 g/dL   Albumin 2.2 (L) 3.5 - 5.0 g/dL   AST 69 (H) 15 - 41 U/L   ALT 24 0 - 44 U/L   Alkaline Phosphatase 93 38 - 126 U/L   Total Bilirubin 1.0 0.3 - 1.2 mg/dL   GFR, Estimated 45 (L) >60 mL/min  Comment: (NOTE) Calculated using the CKD-EPI Creatinine Equation (2021)    Anion gap 10 5 - 15    Comment: Performed at [facility], [place] [address], [place], [place] [address]  Glucose, capillary     Status: Abnormal   Collection Time: 09/29/21  6:30 AM  Result Value Ref Range   Glucose-Capillary 104 (H) 70 - 99 mg/dL    Comment: Glucose reference range applies only to samples taken after fasting for at least 8 hours.   ECHOCARDIOGRAM LIMITED BUBBLE STUDY  Result Date: 09/27/2021    ECHOCARDIOGRAM LIMITED REPORT   Patient Name:   MAEDEAN GURGANIOUS Date of Exam: 09/27/2021 Medical Rec #:  [id number]     Height:       64.2 in Accession #:    [id number]    Weight:       146.2 lb Date of Birth:  1956-04-27     BSA:          1.715 m Patient Age:    51 years       BP:           130/82 mmHg Patient Gender: F             HR:           95 bpm. Exam Location:  Inpatient Procedure: Limited Echo, Color Doppler, Cardiac Doppler and Saline Contrast            Bubble Study Indications:    Renal artery thrombosis  History:        Patient has prior history of Echocardiogram examinations, most                 recent 09/23/2021. Stroke; Risk Factors:Hypertension.  Sonographer:    Glo Herring Referring Phys: [place]  1. Left ventricular ejection fraction, by estimation, is 60 to 65%. The left ventricle has normal function. The left ventricle has no regional wall motion abnormalities. Left ventricular diastolic function could not be evaluated.  2. Left atrial size was moderately dilated.  3. Right atrial size was mildly dilated.  4. Tricuspid valve regurgitation is moderate to severe.  5. The aortic valve is tricuspid. Aortic valve regurgitation is not visualized. No aortic stenosis is present.  6. There is moderately elevated pulmonary artery systolic pressure.  7. The inferior vena cava is normal in size with <50% respiratory variability, suggesting right atrial pressure of 8 mmHg.  8. Agitated saline contrast bubble study was negative, with no evidence of any interatrial shunt. FINDINGS  Left Ventricle: Left ventricular ejection fraction, by estimation, is 60 to 65%. The left ventricle has normal function. The left ventricle has no regional wall motion abnormalities. The left ventricular internal cavity size was normal in size. There is  no left ventricular hypertrophy. Left ventricular diastolic function could not be evaluated. Left ventricular diastolic function could not be evaluated due to atrial fibrillation. Right Ventricle: There is moderately elevated pulmonary artery systolic pressure. The tricuspid regurgitant velocity is 3.12 m/s, and with an assumed right atrial pressure of 8 mmHg, the estimated right ventricular systolic pressure is 0000000  mmHg. Left Atrium: Left atrial size was moderately dilated. Right Atrium: Right atrial size was mildly dilated. Pericardium: There is no evidence of pericardial effusion. Mitral Valve: Mild mitral annular calcification. Tricuspid Valve: Tricuspid valve regurgitation is moderate to severe. Aortic Valve: The aortic valve is tricuspid. Aortic valve regurgitation is not visualized. No aortic stenosis is present. Venous: The inferior vena cava  is normal in size with less than 50% respiratory variability, suggesting right atrial pressure of 8 mmHg. IAS/Shunts: No atrial level shunt detected by color flow Doppler. Agitated saline contrast was given intravenously to evaluate for intracardiac shunting. Agitated saline contrast bubble study was negative, with no evidence of any interatrial shunt. LEFT VENTRICLE PLAX 2D LVIDd:         4.50 cm LVIDs:         2.90 cm LV PW:         0.90 cm LV IVS:        0.90 cm  IVC IVC diam: 1.80 cm LEFT ATRIUM         Index LA diam:    4.80 cm 2.80 cm/m  TRICUSPID VALVE TR Peak grad:   38.9 mmHg TR Vmax:        312.00 cm/s Rachelle Hora Croitoru MD Electronically signed by Thurmon Fair MD Signature Date/Time: 09/27/2021/2:20:30 PM    Final        Medical Problem List and Plan: 1. Functional deficits secondary to left anterior inferior cerebellar artery infarct and pyelonephritis  -patient may shower  -ELOS/Goals: 12-14 days, min assist goals with PT, OT and min-mod assist with SLP. She'll need some motivation at times to participate 2.  Antithrombotics: -DVT/anticoagulation:  INR 3.1 today, continue lovenox coverage with coumadin. If INR >2.0 12/31 can stop lovenox. Appreciate pharmacy help  -antiplatelet therapy: Aspirin 81 mg daily 3. Pain Management: Tylenol 4. Mood: LCSW to evaluate and provide emotional support  -antipsychotic agents: N/A  -daughter supportive. Pt is unhappy about still being in the hospital. Will need encouragement and positive reinforcement from team. 5.  Neuropsych: This patient is not capable of making decisions on her own behalf. 6. Skin/Wound Care: Routine skin care checks 7. Fluids/Electrolytes/Nutrition: Ins and outs and follow-up chemistries. 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  Pending hypercoagulable labs, primary care provider could consider transitioning back to NOAC if the work-up is negative.  Consider hematology referral as outpatient. 9.  Atrial fibrillation with RVR: amlodipine held. Diltiazem infusion weaned. HE improved -- metoprolol 50 mg BID -HR elevated to 150 this morning with PT. Observe for patterns.  -consider resuming amlodipine (home med) bp permitting 10: Klebsiella positive, multifocal pyelonephritis with abscess: Infectious disease recommend transition from intravenous cefazolin to cefadroxil twice a day on discharge to complete a 4-week treatment course.  Stop date of the 10/20/2021. 11: Possible seizure activity:  --Keppra 500 mg BID 12: Hypertension: stable (see #9 above) --metoprolol 50 mg BID -- Resume home amlodipine as needed 13: AKI: baseline Scr> 0.70. 1.74 on 12/26, 1.32 on 12/28. Monitor trend. 14.  Mild anemia: No indication for transfusion.  Monitor H&H.        Milinda Antis, PA-C 09/29/2021

## 2021-09-29 NOTE — H&P (Signed)
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  She presented to the emergency department on September 19, 2021 with new onset of nausea, vomiting diaphoresis as well as change in baseline mental status as reported by her family.  She had no report of loss of consciousness or seizure type activity.  No known fever or chills.  As part of her work-up, she underwent CT scan of the abdomen pelvis which revealed multifocal pyelonephritis of the right kidney.  Urology was contacted and Dr. Jeffie Pollock reviewed the CT scan and the patient's data.  He recommended infectious disease consultation.  She was started on empiric antibiotics.  Her urine was positive for Klebsiella.  She was transitioned to oral Levaquin on 1219. On admission, CT scan of the head was also performed which was negative.  Due to worsening speech, MRI was performed on 12/21 which revealed an acute moderate sized left AICA cerebellar infarct.  Underlying extensive chronic left MCA territory encephalomalacia related to the 2021 infarct was noted.  Associated left brainstem wallerian degeneration since last year was noted.  Small superimposed chronic left PICA cerebellar infarcts were new since last year.  She developed elevated heart rate, decreased responsiveness and foaming at her mouth on 12/22 and rapid response was called.  Neurology evaluated and Keppra load of 1500 mg IV was given and she was started on Keppra 500 mg twice a day.  Aspirin 81 mg was added.  2D echo was completed and was negative.  Ejection fraction was estimated at 50 to 55%.  Patient was admitted to physical medicine and rehab inpatient services on September 24, 2021.  On the evening of 12/24, she developed mild tachycardia and fever to 102.5.  Lab work was completed at her  repeat white blood cell count revealed elevation to 26,000.  His antibiotics were broadened to include vancomycin and Zosyn.  IV fluids are started for volume support.  On 12/25, repeat CT of the abdomen pelvis were performed.  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  EKG revealed a heart rate of 139 with rapid ventricular response..  Lopressor was given intravenously.  These findings and the case were discussed with internal medicine and the patient was transferred from inpatient rehab to acute care internal medicine service.  She was started on diltiazem infusion. Eliquis was discontinued and she was started on heparin infusion.  As she was maintained on Eliquis throughout her admission to CIR, concern for antiphospholipid antibody syndrome was considered.  Work-up with cardiolipin and beta-2 glycoprotein antibodies are pending.  Lupus anticoagulant is also pending however this may not be reliable since she was receiving heparin infusion.  She remains in A. fib on review of telemetry but her rates are overall improved.  She was started on Lovenox 65 mg every 12 hours on 12/27.  Her first dose of warfarin 2 mg was administered on 12/27 and 1645 hrs.  Infectious disease consultation was obtained by Dr. Carlyle Basques. Antibiotics were narrowed back to cefazolin since no new source of infection was found.  Her white blood cell count improved.  She has been afebrile for approximately 48 hours.  Laboratory work-up revealed acute kidney injury with increase in serum creatinine from 0.79  on 12/23 to 1.61 on 12/24.  Trended upward to 1.74 on 12/26.  Today, her serum creatinine is 1.32.  She had 1800 cc of urine output yesterday.  Patient requires inpatient medicine and rehabilitation evaluations and services for ongoing dysfunction secondary to cerebellar stroke.  She lives at home with her  daughter who provides care.   Review of Systems  Unable to perform ROS: Patient nonverbal  Past Medical History:  Diagnosis Date   Diverticulosis    Hypertension    Uterine prolapse    No past surgical history on file. Family History  Problem Relation Age of Onset   Breast cancer Neg Hx    Social History:  reports that she has never smoked. She has never used smokeless tobacco. She reports that she does not currently use alcohol. She reports that she does not use drugs. Allergies: No Known Allergies Medications Prior to Admission  Medication Sig Dispense Refill   acetaminophen (TYLENOL) 500 MG tablet Take 500 mg by mouth every 6 (six) hours as needed for moderate pain.     amLODipine (NORVASC) 5 MG tablet Take 1 tablet (5 mg total) by mouth daily. (Patient not taking: Reported on 09/19/2021) 30 tablet 3   apixaban (ELIQUIS) 5 MG TABS tablet Take 1 tablet (5 mg total) by mouth 2 (two) times daily. 180 tablet 0   aspirin EC 81 MG EC tablet Take 1 tablet (81 mg total) by mouth daily. Swallow whole. 30 tablet 0   atorvastatin (LIPITOR) 20 MG tablet Take 1 tablet (20 mg total) by mouth daily at 6 PM. 90 tablet 3   levETIRAcetam (KEPPRA) 500 MG tablet Take 1 tablet (500 mg total) by mouth every 12 (twelve) hours. 60 tablet 0   levofloxacin (LEVAQUIN) 750 MG tablet Take 1 tablet (750 mg total) by mouth daily for 27 days. 27 tablet 0   metoprolol tartrate (LOPRESSOR) 50 MG tablet Take 1 tablet (50 mg total) by mouth 2 (two) times daily. 60 tablet 0   Multiple Vitamins-Minerals (MULTIVITAMIN ADULTS 50+) TABS Take 1 tablet by mouth daily.     ondansetron (ZOFRAN) 4 MG tablet Take 1 tablet (4 mg total) by mouth every 12 (twelve) hours as needed for nausea or vomiting. 20 tablet 0    Drug Regimen Review  Drug regimen was reviewed and remains appropriate with no significant issues identified  Home: Home Living Family/patient expects to be discharged to:: Inpatient rehab Living Arrangements:  Children Available Help at Discharge: Family, Available PRN/intermittently Type of Home: House  Lives With: Daughter   Functional History: Prior Function Prior Level of Function : Needs assist  Cognitive Assist : ADLs (cognitive) ADLs (Cognitive): Intermittent cues Physical Assist : Mobility (physical), ADLs (physical) Mobility (physical): Gait, Stairs ADLs (physical): Grooming, Bathing, Dressing, Toileting, IADLs Mobility Comments: Baseline function, pt ambulatory with SPC and knee brace; requires assist on carpet due to reduced R foot clearance ADLs Comments: needs assistance with bathing, dressing, grooming, setup for feeding  Functional Status:  Mobility: Bed Mobility Overal bed mobility: Needs Assistance Bed Mobility: Supine to Sit Supine to sit: Mod assist, HOB elevated General bed mobility comments: Pt initiating well with LUE/LLE, modA for RLE management and trunk elevation Transfers Overall transfer level: Needs assistance Equipment used: 1 person hand held assist Transfers: Sit to/from Stand, Bed to chair/wheelchair/BSC Sit to Stand: Mod assist Step pivot transfers: Mod assist General transfer comment: ModA for trunk elevation standing from EOB, pivotal steps from bed to recliner pt reaching to armrests for  LUE support in order to shift body weight, difficulty taking complete steps with RLE      ADL:    Cognition: Cognition Overall Cognitive Status: Difficult to assess Orientation Level: Oriented to person, Oriented to place, Disoriented to time, Disoriented to situation Cognition Arousal/Alertness: Awake/alert Behavior During Therapy: WFL for tasks assessed/performed, Flat affect Overall Cognitive Status: Difficult to assess Area of Impairment: Attention, Following commands, Awareness, Problem solving Current Attention Level: Selective Following Commands: Follows one step commands with increased time Awareness: Emergent Problem Solving: Slow processing,  Decreased initiation, Requires verbal cues, Requires tactile cues General Comments: Pt with dysarthria and aphasia, answering yes/no questions appropriately, able to answer some simple questions when asked and given options (legs up or down), requires repetition with this. Pt with good awareness of transfer set-up, cues for sequencing and safety; pleasant and motivated for OOB Difficult to assess due to: Impaired communication  Physical Exam: Blood pressure 137/77, pulse 88, temperature 98.5 F (36.9 C), temperature source [place], resp. rate (!) 26, SpO2 96 %. Physical Exam Constitutional:      General: She is not in acute distress.    Appearance: She is not ill-appearing.  HENT:     Head: Normocephalic.     Right Ear: External ear normal.     Left Ear: External ear normal.     Nose: Nose normal.     Mouth/Throat:     Mouth: Mucous membranes are moist.  Eyes:     General:        Right eye: No discharge.        Left eye: No discharge.  Cardiovascular:     Rate and Rhythm: Normal rate. Rhythm irregular.     Heart sounds: No murmur heard.   No gallop.  Pulmonary:     Effort: Pulmonary effort is normal. No respiratory distress.     Breath sounds: No wheezing.  Abdominal:     General: There is no distension.     Tenderness: There is no abdominal tenderness.  Musculoskeletal:     Cervical back: Normal range of motion.  Neurological:     Mental Status: She is alert.     Comments: Pt is alert. Follows simple basic commands. Nods head "yes/no" to simple questions. Exp>rec aphasia. Did not engage much however during exam due to mood. RUE tr/5. RLE 1-2/5 prox to 0-trace distally (poor effort). Moves left arm and leg freely. Decreased pain sense Right arm and leg. Does have resting flexor tone right wrist and elbow.   Psychiatric:     Comments: Irritable, didn't want to make eye contact.     Results for orders placed or performed during the hospital encounter of 09/26/21 (from the past 48  hour(s))  Comprehensive metabolic panel     Status: Abnormal   Collection Time: 09/27/21  8:37 AM  Result Value Ref Range   Sodium 136 135 - 145 mmol/L   Potassium 3.8 3.5 - 5.1 mmol/L   Chloride 102 98 - 111 mmol/L   CO2 23 22 - 32 mmol/L   Glucose, Bld 101 (H) 70 - 99 mg/dL    Comment: Glucose reference range applies only to samples taken after fasting for at least 8 hours.   BUN 17 8 - 23 mg/dL   Creatinine, Ser 1.74 (H) 0.44 - 1.00 mg/dL   Calcium 8.9 8.9 - 10.3 mg/dL   Total Protein 7.7 6.5 - 8.1 g/dL   Albumin 2.4 (L) 3.5 - 5.0 g/dL   AST 71 (H)  15 - 41 U/L   ALT 32 0 - 44 U/L   Alkaline Phosphatase 75 38 - 126 U/L   Total Bilirubin 1.1 0.3 - 1.2 mg/dL   GFR, Estimated 32 (L) >60 mL/min    Comment: (NOTE) Calculated using the CKD-EPI Creatinine Equation (2021)    Anion gap 11 5 - 15    Comment: Performed at [facility] [address], [place], [place] [address]  Protime-INR     Status: Abnormal   Collection Time: 09/27/21  8:37 AM  Result Value Ref Range   Prothrombin Time 24.7 (H) 11.4 - 15.2 seconds   INR 2.2 (H) 0.8 - 1.2    Comment: (NOTE) INR goal varies based on device and disease states. Performed at [facility], [place] [address] [place], [place] [address]   APTT     Status: Abnormal   Collection Time: 09/27/21  8:37 AM  Result Value Ref Range   aPTT 92 (H) 24 - 36 seconds    Comment:        IF BASELINE aPTT IS ELEVATED, SUGGEST PATIENT RISK ASSESSMENT BE USED TO DETERMINE APPROPRIATE ANTICOAGULANT THERAPY. Performed at [facility], [place] [address], [place], [place] [address]   CBC     Status: Abnormal   Collection Time: 09/27/21  8:37 AM  Result Value Ref Range   WBC 26.3 (H) 4.0 - 10.5 K/uL   RBC 3.43 (L) 3.87 - 5.11 MIL/uL   Hemoglobin 10.1 (L) 12.0 - 15.0 g/dL   HCT 29.3 (L) 36.0 - 46.0 %   MCV 85.4 80.0 - 100.0 fL   MCH 29.4 26.0 - 34.0 pg   MCHC 34.5 30.0 - 36.0 g/dL   RDW 15.2 11.5 - 15.5 %   Platelets 302 150 - 400 K/uL    nRBC 0.0 0.0 - 0.2 %    Comment: Performed at [facility], [place] [address] [place], [place] [address]  Lactic acid, plasma     Status: Abnormal   Collection Time: 09/27/21  8:37 AM  Result Value Ref Range   Lactic Acid, Venous 2.0 (HH) 0.5 - 1.9 mmol/L    Comment: CRITICAL VALUE NOTED.  VALUE IS CONSISTENT WITH PREVIOUSLY REPORTED AND CALLED VALUE. Performed at [facility], [place] [address] [place], [place] [address]   Heparin level (unfractionated)     Status: Abnormal   Collection Time: 09/27/21  8:37 AM  Result Value Ref Range   Heparin Unfractionated >1.10 (H) 0.30 - 0.70 IU/mL    Comment: (NOTE) The clinical reportable range upper limit is being lowered to >1.10 to align with the FDA approved guidance for the current laboratory assay.  If heparin results are below expected values, and patient dosage has  been confirmed, suggest follow up testing of antithrombin III levels. Performed at [facility], [place] [address], [place],  [address]   Urinalysis, Routine w reflex microscopic     Status: Abnormal   Collection Time: 09/27/21  2:19 PM  Result Value Ref Range   Color, Urine YELLOW YELLOW   APPearance CLEAR CLEAR   Specific Gravity, Urine 1.025 1.005 - 1.030   pH 6.0 5.0 - 8.0   Glucose, UA NEGATIVE NEGATIVE mg/dL   Hgb urine dipstick LARGE (A) NEGATIVE   Bilirubin Urine NEGATIVE NEGATIVE   Ketones, ur NEGATIVE NEGATIVE mg/dL   Protein, ur 100 (A) NEGATIVE mg/dL   Nitrite NEGATIVE NEGATIVE   Leukocytes,Ua NEGATIVE NEGATIVE  Comment: Performed at [facility], [address] [place], [place] [address]  Urinalysis, Microscopic (reflex)     Status: Abnormal   Collection Time: 09/27/21  2:19 PM  Result Value Ref Range   RBC / HPF >50 0 - 5 RBC/hpf   WBC, UA 0-5 0 - 5 WBC/hpf   Bacteria, UA FEW (A) NONE SEEN   Squamous Epithelial / LPF 0-5 0 - 5    Comment: Performed at [facility], [address] [place] [address], [place], [place] [address]   APTT     Status: Abnormal   Collection Time: 09/27/21  8:08 PM  Result Value Ref Range   aPTT 50 (H) 24 - 36 seconds    Comment:        IF BASELINE aPTT IS ELEVATED, SUGGEST PATIENT RISK ASSESSMENT BE USED TO DETERMINE APPROPRIATE ANTICOAGULANT THERAPY. Performed at [facility], [address], [place], [place] [address]   Basic metabolic panel     Status: Abnormal   Collection Time: 09/28/21  3:25 AM  Result Value Ref Range   Sodium 133 (L) 135 - 145 mmol/L   Potassium 3.7 3.5 - 5.1 mmol/L   Chloride 102 98 - 111 mmol/L   CO2 21 (L) 22 - 32 mmol/L   Glucose, Bld 113 (H) 70 - 99 mg/dL    Comment: Glucose reference range applies only to samples taken after fasting for at least 8 hours.   BUN 14 8 - 23 mg/dL   Creatinine, Ser 1.96 (H) 0.44 - 1.00 mg/dL   Calcium 8.7 (L) 8.9 - 10.3 mg/dL   GFR, Estimated 44 (L) >60 mL/min    Comment: (NOTE) Calculated using the CKD-EPI Creatinine Equation (2021)    Anion gap 10 5 - 15    Comment: Performed at [facility], [address], [place], [place] [address]  APTT     Status: Abnormal   Collection Time: 09/28/21  3:25 AM  Result Value Ref Range   aPTT 78 (H) 24 - 36 seconds    Comment:        IF BASELINE aPTT IS ELEVATED, SUGGEST PATIENT RISK ASSESSMENT BE USED TO DETERMINE APPROPRIATE ANTICOAGULANT THERAPY. Performed at [facility], [address], [place], [place] [address]   Heparin level (unfractionated)     Status: Abnormal   Collection Time: 09/28/21  3:25 AM  Result Value Ref Range   Heparin Unfractionated >1.10 (H) 0.30 - 0.70 IU/mL    Comment: (NOTE) The clinical reportable range upper limit is being lowered to >1.10 to align with the FDA approved guidance for the current laboratory assay.  If heparin results are below expected values, and patient dosage has  been confirmed, suggest follow up testing of antithrombin III levels. Performed at [facility], [address] [place],  [place] [address]   CBC     Status: Abnormal   Collection Time: 09/28/21  3:25 AM  Result Value Ref Range   WBC 19.5 (H) 4.0 - 10.5 K/uL   RBC 3.47 (L) 3.87 - 5.11 MIL/uL   Hemoglobin 9.9 (L) 12.0 - 15.0 g/dL   HCT 40.8 (L) 14.4 - 81.8 %   MCV 83.0 80.0 - 100.0 fL   MCH 28.5 26.0 - 34.0 pg   MCHC 34.4 30.0 - 36.0 g/dL   RDW 56.3 14.9 - 70.2 %   Platelets 300 150 - 400 K/uL   nRBC 0.0 0.0 - 0.2 %  Comment: Performed at [facility], [place] [address], [place], [place] [address]  Protime-INR     Status: Abnormal   Collection Time: 09/28/21  3:25 AM  Result Value Ref Range   Prothrombin Time 21.7 (H) 11.4 - 15.2 seconds   INR 1.9 (H) 0.8 - 1.2    Comment: (NOTE) INR goal varies based on device and disease states. Performed at [facility], [place] [address] [place], [place] [address]   Glucose, capillary     Status: Abnormal   Collection Time: 09/28/21  6:41 AM  Result Value Ref Range   Glucose-Capillary 125 (H) 70 - 99 mg/dL    Comment: Glucose reference range applies only to samples taken after fasting for at least 8 hours.   Comment 1 Notify RN    Comment 2 Document in Chart   CBC     Status: Abnormal   Collection Time: 09/29/21  1:55 AM  Result Value Ref Range   WBC 14.1 (H) 4.0 - 10.5 K/uL   RBC 3.60 (L) 3.87 - 5.11 MIL/uL   Hemoglobin 10.6 (L) 12.0 - 15.0 g/dL   HCT 29.6 (L) 36.0 - 46.0 %   MCV 82.2 80.0 - 100.0 fL   MCH 29.4 26.0 - 34.0 pg   MCHC 35.8 30.0 - 36.0 g/dL   RDW 15.1 11.5 - 15.5 %   Platelets 282 150 - 400 K/uL   nRBC 0.0 0.0 - 0.2 %    Comment: Performed at [facility], [place] [address] [place], [place] [address]  Magnesium     Status: None   Collection Time: 09/29/21  1:55 AM  Result Value Ref Range   Magnesium 1.7 1.7 - 2.4 mg/dL    Comment: Performed at [facility] [address], [place],  [address]  Protime-INR     Status: Abnormal   Collection Time: 09/29/21  1:55 AM  Result Value Ref Range   Prothrombin Time 19.0 (H)  11.4 - 15.2 seconds   INR 1.6 (H) 0.8 - 1.2    Comment: (NOTE) INR goal varies based on device and disease states. Performed at [facility], [place] [address], [place],  [address]   Comprehensive metabolic panel     Status: Abnormal   Collection Time: 09/29/21  1:55 AM  Result Value Ref Range   Sodium 131 (L) 135 - 145 mmol/L   Potassium 4.6 3.5 - 5.1 mmol/L    Comment: DELTA CHECK NOTED   Chloride 98 98 - 111 mmol/L   CO2 23 22 - 32 mmol/L   Glucose, Bld 134 (H) 70 - 99 mg/dL    Comment: Glucose reference range applies only to samples taken after fasting for at least 8 hours.   BUN 13 8 - 23 mg/dL   Creatinine, Ser 1.32 (H) 0.44 - 1.00 mg/dL   Calcium 8.6 (L) 8.9 - 10.3 mg/dL   Total Protein 7.0 6.5 - 8.1 g/dL   Albumin 2.2 (L) 3.5 - 5.0 g/dL   AST 69 (H) 15 - 41 U/L   ALT 24 0 - 44 U/L   Alkaline Phosphatase 93 38 - 126 U/L   Total Bilirubin 1.0 0.3 - 1.2 mg/dL   GFR, Estimated 45 (L) >60 mL/min    Comment: (NOTE) Calculated using the CKD-EPI Creatinine Equation (2021)    Anion gap 10 5 - 15    Comment: Performed at [facility], [place] [address] [place],  [address]  Glucose, capillary     Status: Abnormal   Collection Time: 09/29/21  6:30 AM  Result Value Ref Range   Glucose-Capillary 104 (H) 70 - 99 mg/dL    Comment: Glucose reference range applies only to samples taken after fasting for at least 8 hours.   ECHOCARDIOGRAM LIMITED BUBBLE STUDY  Result Date: 09/27/2021    ECHOCARDIOGRAM LIMITED REPORT   Patient Name:   MUR[place] CITIZEN Date of Exam: 09/27/2021 Medical Rec #:  [id number]     Height:       64.2 in Accession #:    [id number]    Weight:       146.2 lb Date of Birth:  June 17, 1956     BSA:          1.715 m Patient Age:    43 years      BP:           130/82 mmHg Patient Gender: F             HR:           95 bpm. Exam Location:  Inpatient Procedure: Limited Echo, Color Doppler, Cardiac Doppler and Saline Contrast            Bubble Study  Indications:    Renal artery thrombosis  History:        Patient has prior history of Echocardiogram examinations, most                 recent 09/23/2021. Stroke; Risk Factors:Hypertension.  Sonographer:    Glo Herring Referring Phys: [place]  1. Left ventricular ejection fraction, by estimation, is 60 to 65%. The left ventricle has normal function. The left ventricle has no regional wall motion abnormalities. Left ventricular diastolic function could not be evaluated.  2. Left atrial size was moderately dilated.  3. Right atrial size was mildly dilated.  4. Tricuspid valve regurgitation is moderate to severe.  5. The aortic valve is tricuspid. Aortic valve regurgitation is not visualized. No aortic stenosis is present.  6. There is moderately elevated pulmonary artery systolic pressure.  7. The inferior vena cava is normal in size with <50% respiratory variability, suggesting right atrial pressure of 8 mmHg.  8. Agitated saline contrast bubble study was negative, with no evidence of any interatrial shunt. FINDINGS  Left Ventricle: Left ventricular ejection fraction, by estimation, is 60 to 65%. The left ventricle has normal function. The left ventricle has no regional wall motion abnormalities. The left ventricular internal cavity size was normal in size. There is  no left ventricular hypertrophy. Left ventricular diastolic function could not be evaluated. Left ventricular diastolic function could not be evaluated due to atrial fibrillation. Right Ventricle: There is moderately elevated pulmonary artery systolic pressure. The tricuspid regurgitant velocity is 3.12 m/s, and with an assumed right atrial pressure of 8 mmHg, the estimated right ventricular systolic pressure is 0000000 mmHg. Left Atrium: Left atrial size was moderately dilated. Right Atrium: Right atrial size was mildly dilated. Pericardium: There is no evidence of pericardial effusion. Mitral Valve: Mild mitral annular  calcification. Tricuspid Valve: Tricuspid valve regurgitation is moderate to severe. Aortic Valve: The aortic valve is tricuspid. Aortic valve regurgitation is not visualized. No aortic stenosis is present. Venous: The inferior vena cava is normal in size with less than 50% respiratory variability, suggesting right atrial pressure of 8 mmHg. IAS/Shunts: No atrial level shunt detected by color flow Doppler. Agitated saline contrast was given intravenously to evaluate for  intracardiac shunting. Agitated saline contrast bubble study was negative, with no evidence of any interatrial shunt. LEFT VENTRICLE PLAX 2D LVIDd:         4.50 cm LVIDs:         2.90 cm LV PW:         0.90 cm LV IVS:        0.90 cm  IVC IVC diam: 1.80 cm LEFT ATRIUM         Index LA diam:    4.80 cm 2.80 cm/m  TRICUSPID VALVE TR Peak grad:   38.9 mmHg TR Vmax:        312.00 cm/s Dani Gobble Croitoru MD Electronically signed by Sanda Klein MD Signature Date/Time: 09/27/2021/2:20:30 PM    Final        Medical Problem List and Plan: 1. Functional deficits secondary to left anterior inferior cerebellar artery infarct and pyelonephritis  -patient may shower  -ELOS/Goals: 12-14 days, min assist goals with PT, OT and min-mod assist with SLP. She'll need some motivation at times to participate 2.  Antithrombotics: -DVT/anticoagulation:  INR 3.1 today, continue lovenox coverage with coumadin. If INR >2.0 12/31 can stop lovenox. Appreciate pharmacy help  -antiplatelet therapy: Aspirin 81 mg daily 3. Pain Management: Tylenol 4. Mood: LCSW to evaluate and provide emotional support  -antipsychotic agents: N/A  -daughter supportive. Pt is unhappy about still being in the hospital. Will need encouragement and positive reinforcement from team. 5. Neuropsych: This patient is not capable of making decisions on her own behalf. 6. Skin/Wound Care: Routine skin care checks 7. Fluids/Electrolytes/Nutrition: Ins and outs and follow-up chemistries. 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  Pending hypercoagulable labs, primary care provider could consider transitioning back to NOAC if the work-up is negative.  Consider hematology referral as outpatient. 9.  Atrial fibrillation with RVR: amlodipine held. Diltiazem infusion weaned. HE improved -- metoprolol 50 mg BID -HR elevated to 150 this morning with PT. Observe for patterns.  -consider resuming amlodipine (home med) bp permitting 10: Klebsiella positive, multifocal pyelonephritis with abscess: Infectious disease recommend transition from intravenous cefazolin to cefadroxil twice a day on discharge to complete a 4-week treatment course.  Stop date of the 10/20/2021. 11: Possible seizure activity:  --Keppra 12: Hypertension: stable --metoprolol 50 mg BID -- Resume home amlodipine as needed 13: AKI: baseline Scr> 0.70. 1.74 on 12/26, 1.32 on 12/28. Monitor trend. 14.  Mild anemia: No indication for transfusion.  Monitor H&H.        Barbie Banner, PA-C 09/29/2021

## 2021-09-29 NOTE — Progress Notes (Signed)
HD#3 SUBJECTIVE:  Patient Summary: Lindsey [name] is a 65 y.o. 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 fib, seizure, multifocal pyonephritis, hypertension, chronic normocytic anemia, who presented with new fever, leukocytosis, and A. fib with RVR and admitted for L renal infarct.   Overnight Events: None  Interim History: Patient is resting comfortably in bed.  She responds appropriately to questions.  I explained to her that we were adjusting her medications to make sure her heart rate was at a safe level before she is able to go home, and she appeared excited at the mention of being able to go home.  OBJECTIVE:  Vital Signs: Vitals:   09/28/21 1922 09/28/21 2312 09/29/21 0345 09/29/21 0809  BP: 129/78 122/85 137/77 (!) 133/92  Pulse: 92 79 88 90  Resp: (!) 22 (!) 27 (!) 26 (!) 24  Temp: 98.6 F (37 C) 98.8 F (37.1 C) 98.5 F (36.9 C) 98.3 F (36.8 C)  TempSrc: Oral Oral Oral Oral  SpO2: 96% 97% 96% 98%   Supplemental O2: Room Air SpO2: 98 %  There were no vitals filed for this visit.   Intake/Output Summary (Last 24 hours) at 09/29/2021 0945 Last data filed at 09/29/2021 0816 Gross per 24 hour  Intake 995 ml  Output 2400 ml  Net -1405 ml   Net IO Since Admission: -1,070.15 mL [09/29/21 0945]  Physical Exam: Constitutional: Patient is resting calmly in bed, no acute distress noted. Cardio: Tachycardic with irregularly irregular rhythm.  No murmurs, rubs, gallops. Pulm: Clear to auscultation bilaterally.  Normal work of breathing on room air. Abdomen: Soft, nondistended.  Mild tenderness to palpation over suprapubic area. MSK: Negative for extremity edema. Skin: Skin is warm and dry. Neuro: Speech is dysarthric but spontaneous.  Patient Lines/Drains/Airways Status     Active Line/Drains/Airways     Name Placement date Placement time Site Days   Peripheral IV 09/25/21 22 G 1"  Left;Posterior Forearm 09/25/21  2205  Forearm  4   Peripheral IV 09/26/21 22 G 1" Right Antecubital 09/26/21  2309  Antecubital  3   Peripheral IV 09/26/21 20 G 1.88" Left;Anterior Forearm 09/26/21  2334  Forearm  3   External Urinary Catheter 09/19/21  1201  --  10             ASSESSMENT/PLAN:  Assessment: Principal Problem:   Pyelonephritis Active Problems:   Atrial fibrillation with RVR (HCC)   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HCC)  Lindsey [name] is a 65 y.o. 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 fib, seizure, multifocal pyonephritis, hypertension, chronic normocytic anemia, who presented with new fever, leukocytosis, and A. 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  Leukocytosis continues to trend downwards, 14.1 today from 19.5.  She has been on Eliquis, which was transitioned 12/27 to warfarin with Lovenox bridge given the development of this thrombus despite anticoagulant therapy.  Serology for antiphospholipid antibody syndrome was collected 12/27. -Continue Lovenox and warfarin per pharmacy, appreciate their assistance -Continue aspirin 81 mg daily -Continue atorvastatin 20 mg  daily -Consider outpatient hematology referral  #Multifocal pyelonephritis of R kidney Urine output of 1.8 L over the last 24 hours.  Blood cultures continue to show no growth to date.  Creatinine slightly improved to 1.32 from 1.33.  Patient is currently on cefazolin IV. -Infectious disease following, appreciate their assistance  -Continue cefazolin IV with transition to cefadroxil on discharge with stop date of  10/20/2021. -Follow blood cultures  #A. fib with RVR On presentation, patient was noted to have A. fib with RVR contributed to L renal artery thrombosis and resulting infarction.  She has been managed with Lopressor 50 mg twice daily and diltiazem drip with adequate rate control, however the diltiazem drip was discontinued this morning and resulted in borderline tachycardia at rest and tachycardia to the 150s with movement. -Unfortunately, the patient is not yet stable for transfer to CIR with persistent RVR when off of diltiazem drip. -We increase diltiazem to 45 mg every 6 hours, hopeful discharge to CIR tomorrow on long-acting diltiazem -K greater than 4, Mg greater than 2  #Possible seizure Continue Keppra 500 mg twice daily.  Best Practice: Diet: Dysphagia 3 IVF: None VTE:   Warfarin with Lovenox bridge Code: Full AB: Cefazolin Therapy Recs: CIR, DME: none DISPO: Anticipated discharge to  CIR  pending Medical stability.  Portions of this report may have been transcribed using voice recognition software. Every effort was made to ensure accuracy; however, inadvertent computerized transcription errors may be present.   Signature: Champ Mungo, D.O.  Internal Medicine Resident, PGY-1 [facility]  Pager: [phone] 9:45 AM, 09/29/2021   Please contact the on call pager after 5 pm and on weekends at [phone].

## 2021-09-30 DIAGNOSIS — N12 Tubulo-interstitial nephritis, not specified as acute or chronic: Secondary | ICD-10-CM | POA: Diagnosis not present

## 2021-09-30 DIAGNOSIS — I4891 Unspecified atrial fibrillation: Secondary | ICD-10-CM | POA: Diagnosis not present

## 2021-09-30 LAB — CBC
HCT: 29.7 % — ABNORMAL LOW (ref 36.0–46.0)
Hemoglobin: 10.6 g/dL — ABNORMAL LOW (ref 12.0–15.0)
MCH: 29.4 pg (ref 26.0–34.0)
MCHC: 35.7 g/dL (ref 30.0–36.0)
MCV: 82.3 fL (ref 80.0–100.0)
Platelets: 291 10*3/uL (ref 150–400)
RBC: 3.61 MIL/uL — ABNORMAL LOW (ref 3.87–5.11)
RDW: 15.1 % (ref 11.5–15.5)
WBC: 9.6 10*3/uL (ref 4.0–10.5)
nRBC: 0 % (ref 0.0–0.2)

## 2021-09-30 LAB — PROTIME-INR
INR: 3.5 — ABNORMAL HIGH (ref 0.8–1.2)
INR: 4.2 (ref 0.8–1.2)
Prothrombin Time: 35.1 seconds — ABNORMAL HIGH (ref 11.4–15.2)
Prothrombin Time: 40.7 seconds — ABNORMAL HIGH (ref 11.4–15.2)

## 2021-09-30 LAB — GLUCOSE, CAPILLARY: Glucose-Capillary: 105 mg/dL — ABNORMAL HIGH (ref 70–99)

## 2021-09-30 MED ORDER — ENOXAPARIN SODIUM 80 MG/0.8ML IJ SOSY: PREFILLED_SYRINGE | INTRAMUSCULAR | 0 refills | Status: DC

## 2021-09-30 MED ORDER — DILTIAZEM HCL ER COATED BEADS 180 MG PO CP24: 180.0000 mg | ORAL_CAPSULE | Freq: Every day | ORAL | 0 refills | Status: DC

## 2021-09-30 MED ORDER — CEFADROXIL 1 G PO TABS: 1.0000 g | ORAL_TABLET | Freq: Two times a day (BID) | ORAL | 0 refills | Status: DC

## 2021-09-30 MED ORDER — LEVETIRACETAM 500 MG PO TABS
500.0000 mg | ORAL_TABLET | Freq: Two times a day (BID) | ORAL | Status: DC
  Administered 2021-09-30 – 2021-10-01 (×2): 500 mg via ORAL
  Filled 2021-09-30 (×2): qty 1

## 2021-09-30 MED ORDER — DILTIAZEM HCL ER COATED BEADS 180 MG PO CP24
180.0000 mg | ORAL_CAPSULE | Freq: Every day | ORAL | Status: DC
  Administered 2021-09-30 – 2021-10-01 (×2): 180 mg via ORAL
  Filled 2021-09-30 (×2): qty 1

## 2021-09-30 NOTE — Progress Notes (Signed)
ANTICOAGULATION CONSULT NOTE - Follow Up Consult  Pharmacy Consult for enoxaparin + warfarin Indication:  Renal artery thrombosis , hx afib, possible antiphospholipid antibody syndrome  No Known Allergies  Patient Measurements: Heparin Dosing Weight: 66 kg   Vital Signs: Temp: 97.7 F (36.5 C) (12/29 1055) Temp Source: Oral (12/29 0321) BP: 135/67 (12/29 1055) Pulse Rate: 94 (12/29 0745)  Labs: Recent Labs    09/27/21 2008 09/28/21 0325 09/28/21 0325 09/29/21 0155 09/30/21 0540 09/30/21 1206  HGB  --  9.9*   < > 10.6* 10.6*  --   HCT  --  28.8*  --  29.6* 29.7*  --   PLT  --  300  --  282 291  --   APTT 50* 78*  --   --   --   --   LABPROT  --  21.7*   < > 19.0* 35.1* 40.7*  INR  --  1.9*   < > 1.6* 3.5* 4.2*  HEPARINUNFRC  --  >1.10*  --   --   --   --   CREATININE  --  1.33*  --  1.32*  --   --    < > = values in this interval not displayed.    Estimated Creatinine Clearance: 40 mL/min (A) (by C-G formula based on SCr of 1.32 mg/dL (H)).   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 Pt initially on heparin infusion, now to transition to warfarin with enoxaparin bridge given new clot while on apixaban.  Patient also undergoing evaluation for possible antiphospholipid antibody syndrome.  INR 1.9 prior to warfarin start likely due to influence of apixaban and underlying mild hepatic dysfunction. INR now increasing rapidly despite lower doses at initiation.  Given acute clot, would recommend continuing Lovenox despite elevated INR unless patient demonstrates signs of bleeding.    Goal of Therapy:  INR 2-3 Anti-Xa level 0.6-1 units/ml 4hrs after LMWH dose given Monitor platelets by anticoagulation protocol: Yes   Plan:  -Continue Enoxaparin 1mg /kg SQ BID -No Warfarin  tonight -Daily INR -Monitor for signs and symptoms of bleed  , Pharm.D., BCPS Clinical Pharmacist Clinical phone for 09/30/2021 from 7:30-3:00 is [phone].  **Pharmacist phone directory can be found on [email or web address] listed under [facility].  09/30/2021 1:32 PM

## 2021-09-30 NOTE — Progress Notes (Signed)
HD#4 SUBJECTIVE:  Patient Summary: Lindsey [name] is a 65 y.o. 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 fib, seizure, multifocal pyonephritis, hypertension, chronic normocytic anemia, who presented with new fever, leukocytosis, and A. fib with RVR and admitted for L renal infarct.   Overnight Events: None  Interim History: Patient is resting calmly bed on exam.  She responds to questions appropriately.  She is eager to be discharged.  OBJECTIVE:  Vital Signs: Vitals:   09/29/21 2307 09/30/21 0321 09/30/21 0745 09/30/21 1055  BP: 111/81 (!) 149/84 (!) 140/99 135/67  Pulse: 90 94 94   Resp: 20 19 19 18   Temp: 98.5 F (36.9 C) 98.5 F (36.9 C) 97.6 F (36.4 C) 97.7 F (36.5 C)  TempSrc: Oral Oral    SpO2: 100% 98% 100%    Supplemental O2: Room Air SpO2: 100 %  There were no vitals filed for this visit.   Intake/Output Summary (Last 24 hours) at 09/30/2021 1119 Last data filed at 09/30/2021 0559 Gross per 24 hour  Intake 939 ml  Output 800 ml  Net 139 ml   Net IO Since Admission: -931.15 mL [09/30/21 1119]  Physical Exam: Constitutional: Patient resting comfortably in bed, no acute distress noted. Cardio: Regular rate with irregularly irregular rhythm.  No murmurs, rubs, gallops. Pulm: Clear to auscultation bilaterally. Abdomen: Soft, nontender, nondistended. MSK: Negative for extremity edema. Skin: Skin is warm and dry. Neuro: Speech is dysarthric but appropriate.  Patient Lines/Drains/Airways Status     Active Line/Drains/Airways     Name Placement date Placement time Site Days   Peripheral IV 09/25/21 22 G 1" Left;Posterior Forearm 09/25/21  2205  Forearm  5   Peripheral IV 09/26/21 22 G 1" Right Antecubital 09/26/21  2309  Antecubital  4   Peripheral IV 09/26/21 20 G 1.88" Left;Anterior Forearm 09/26/21  2334  Forearm  4   External Urinary Catheter 09/19/21  1201  --  11              ASSESSMENT/PLAN:  Assessment: Principal Problem:   Pyelonephritis Active Problems:   Atrial fibrillation with RVR (HCC)   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HCC)   Plan: #History L MCA stroke with R sided hemiplegia and aphasia #AICA cerebellar stroke in PICA infarcts #Possible antiphospholipid antibody syndrome #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  Leukocytosis is now resolved.  She was recently on Eliquis, which was transitioned 12/27 to warfarin with Lovenox bridge given the development of this thrombus despite anticoagulant therapy.  Serology for antiphospholipid antibody syndrome resulted and indicated positive lupus anticoagulant, however the patient was on heparin when this was drawn so these labs will need to be reevaluated now that she is not on heparin therapy.   -Continue Lovenox and warfarin per pharmacy, appreciate their assistance -Continue aspirin 81 mg daily -Continue atorvastatin 20 mg daily -Consider outpatient hematology referral   #Multifocal pyelonephritis of R kidney Urine output of 1.4 L over the last 24 hours.  Blood cultures continue to show no growth to date.  Creatinine unchanged.  Patient is currently on cefazolin IV. -Infectious disease following, appreciate their assistance             -Continue cefazolin IV with transition to cefadroxil on discharge with stop date of 10/20/2021. -Follow blood  cultures   #A. fib with RVR On presentation, patient was noted to have A. fib with RVR contributed to L renal artery thrombosis and resulting infarction.  She has been managed with Lopressor 50 mg twice daily and diltiazem 45 mg every 6 hours with adequate rate control. -CR transfer pending patient/family discussions. -Transition to diltiazem 180 mg daily -K greater than 4, Mg greater than 2    #Possible seizure Continue Keppra 500 mg twice daily.  Best Practice: Diet: Dysphagia 3 IVF: Fluids: IV push only, no IV fluids VTE:   Warfarin with Lovenox bridge Code: Full AB: Cefazolin Therapy Recs: CIR, DME: none DISPO: Anticipated discharge to  CIR  pending Medical stability.  Signature: Champ Mungo, D.O.  Internal Medicine Resident, PGY-1 [facility]  Pager: [phone] 11:19 AM, 09/30/2021   Please contact the on call pager after 5 pm and on weekends at [phone].

## 2021-09-30 NOTE — Progress Notes (Signed)
Inpatient Rehabilitation Admissions Coordinator   I met with patient at bedside and spoke with her daughter by phone. Patient not in agreement to readmit to CIR today. Grandson to visit at 2 pm today and daughter will then also speak with her this afternoon. I will follow up Friday am for plans to admit to CIR pending patient agreement. Acute team and TOC made aware.  Danne Baxter, RN, MSN Rehab Admissions Coordinator [phone] 09/30/2021 11:18 AM

## 2021-10-01 ENCOUNTER — Inpatient Hospital Stay (HOSPITAL_COMMUNITY): Payer: Medicare Other

## 2021-10-01 ENCOUNTER — Encounter: Payer: Medicare Other | Admitting: Physical Medicine and Rehabilitation

## 2021-10-01 ENCOUNTER — Inpatient Hospital Stay (HOSPITAL_COMMUNITY)
Admission: RE | Admit: 2021-10-01 | Discharge: 2021-10-22 | DRG: 057 | Disposition: A | Discharge status: Home / Self Care | Payer: Medicare Other | Source: Intra-hospital | Attending: Physical Medicine and Rehabilitation | Admitting: Physical Medicine and Rehabilitation

## 2021-10-01 ENCOUNTER — Encounter (HOSPITAL_COMMUNITY): Payer: Self-pay | Admitting: Physical Medicine and Rehabilitation

## 2021-10-01 DIAGNOSIS — Z7982 Long term (current) use of aspirin: Secondary | ICD-10-CM | POA: Diagnosis not present

## 2021-10-01 DIAGNOSIS — I10 Essential (primary) hypertension: Secondary | ICD-10-CM | POA: Diagnosis present

## 2021-10-01 DIAGNOSIS — B9689 Other specified bacterial agents as the cause of diseases classified elsewhere: Secondary | ICD-10-CM

## 2021-10-01 DIAGNOSIS — Z79899 Other long term (current) drug therapy: Secondary | ICD-10-CM | POA: Diagnosis not present

## 2021-10-01 DIAGNOSIS — R1310 Dysphagia, unspecified: Secondary | ICD-10-CM | POA: Diagnosis present

## 2021-10-01 DIAGNOSIS — R001 Bradycardia, unspecified: Secondary | ICD-10-CM | POA: Diagnosis present

## 2021-10-01 DIAGNOSIS — R32 Unspecified urinary incontinence: Secondary | ICD-10-CM | POA: Diagnosis present

## 2021-10-01 DIAGNOSIS — D649 Anemia, unspecified: Secondary | ICD-10-CM | POA: Diagnosis present

## 2021-10-01 DIAGNOSIS — F4321 Adjustment disorder with depressed mood: Secondary | ICD-10-CM | POA: Diagnosis present

## 2021-10-01 DIAGNOSIS — I63512 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 middle cerebral artery: Secondary | ICD-10-CM

## 2021-10-01 DIAGNOSIS — I69320 Aphasia following cerebral infarction: Secondary | ICD-10-CM | POA: Diagnosis not present

## 2021-10-01 DIAGNOSIS — N179 Acute kidney failure, unspecified: Secondary | ICD-10-CM | POA: Diagnosis present

## 2021-10-01 DIAGNOSIS — Z7901 Long term (current) use of anticoagulants: Secondary | ICD-10-CM | POA: Diagnosis not present

## 2021-10-01 DIAGNOSIS — M79605 Pain in left leg: Secondary | ICD-10-CM

## 2021-10-01 DIAGNOSIS — R159 Full incontinence of feces: Secondary | ICD-10-CM | POA: Diagnosis present

## 2021-10-01 DIAGNOSIS — I4891 Unspecified atrial fibrillation: Secondary | ICD-10-CM | POA: Diagnosis present

## 2021-10-01 DIAGNOSIS — N280 Ischemia and infarction of kidney: Secondary | ICD-10-CM | POA: Diagnosis present

## 2021-10-01 DIAGNOSIS — N12 Tubulo-interstitial nephritis, not specified as acute or chronic: Secondary | ICD-10-CM | POA: Diagnosis present

## 2021-10-01 DIAGNOSIS — K5900 Constipation, unspecified: Secondary | ICD-10-CM | POA: Diagnosis present

## 2021-10-01 DIAGNOSIS — E559 Vitamin D deficiency, unspecified: Secondary | ICD-10-CM | POA: Diagnosis present

## 2021-10-01 DIAGNOSIS — I63542 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: Secondary | ICD-10-CM | POA: Diagnosis not present

## 2021-10-01 DIAGNOSIS — B961 Klebsiella pneumoniae [K. pneumoniae] as the cause of diseases classified elsewhere: Secondary | ICD-10-CM | POA: Diagnosis present

## 2021-10-01 DIAGNOSIS — G4089 Other seizures: Secondary | ICD-10-CM | POA: Diagnosis present

## 2021-10-01 DIAGNOSIS — I69351 Hemiplegia and hemiparesis following cerebral infarction affecting right dominant side: Principal | ICD-10-CM

## 2021-10-01 DIAGNOSIS — R531 Weakness: Secondary | ICD-10-CM | POA: Diagnosis present

## 2021-10-01 DIAGNOSIS — I639 Cerebral infarction, unspecified: Secondary | ICD-10-CM | POA: Diagnosis not present

## 2021-10-01 DIAGNOSIS — L02818 Cutaneous abscess of other sites: Secondary | ICD-10-CM | POA: Diagnosis present

## 2021-10-01 DIAGNOSIS — Z86718 Personal history of other venous thrombosis and embolism: Secondary | ICD-10-CM | POA: Diagnosis present

## 2021-10-01 DIAGNOSIS — R7401 Elevation of levels of liver transaminase levels: Secondary | ICD-10-CM | POA: Diagnosis not present

## 2021-10-01 DIAGNOSIS — N10 Acute tubulo-interstitial nephritis: Secondary | ICD-10-CM

## 2021-10-01 DIAGNOSIS — D6861 Antiphospholipid syndrome: Secondary | ICD-10-CM | POA: Diagnosis not present

## 2021-10-01 LAB — BASIC METABOLIC PANEL
Anion gap: 12 (ref 5–15)
BUN: 10 mg/dL (ref 8–23)
CO2: 22 mmol/L (ref 22–32)
Calcium: 8.7 mg/dL — ABNORMAL LOW (ref 8.9–10.3)
Chloride: 96 mmol/L — ABNORMAL LOW (ref 98–111)
Creatinine, Ser: 1.19 mg/dL — ABNORMAL HIGH (ref 0.44–1.00)
GFR, Estimated: 51 mL/min — ABNORMAL LOW (ref >60)
Glucose, Bld: 110 mg/dL — ABNORMAL HIGH (ref 70–99)
Potassium: 3.9 mmol/L (ref 3.5–5.1)
Sodium: 130 mmol/L — ABNORMAL LOW (ref 135–145)

## 2021-10-01 LAB — CULTURE, BLOOD (ROUTINE X 2)
Culture: NO GROWTH
Culture: NO GROWTH

## 2021-10-01 LAB — PROTIME-INR
INR: 3.1 — ABNORMAL HIGH (ref 0.8–1.2)
Prothrombin Time: 31.7 seconds — ABNORMAL HIGH (ref 11.4–15.2)

## 2021-10-01 LAB — CBC
HCT: 29.4 % — ABNORMAL LOW (ref 36.0–46.0)
Hemoglobin: 10.4 g/dL — ABNORMAL LOW (ref 12.0–15.0)
MCH: 29.1 pg (ref 26.0–34.0)
MCHC: 35.4 g/dL (ref 30.0–36.0)
MCV: 82.1 fL (ref 80.0–100.0)
Platelets: 299 10*3/uL (ref 150–400)
RBC: 3.58 MIL/uL — ABNORMAL LOW (ref 3.87–5.11)
RDW: 15.1 % (ref 11.5–15.5)
WBC: 7.4 10*3/uL (ref 4.0–10.5)
nRBC: 0 % (ref 0.0–0.2)

## 2021-10-01 MED ORDER — ALUM & MAG HYDROXIDE-SIMETH 200-200-20 MG/5ML PO SUSP: 30.0000 mL | ORAL | Status: DC | PRN

## 2021-10-01 MED ORDER — GUAIFENESIN-DM 100-10 MG/5ML PO SYRP: 5.0000 mL | ORAL_SOLUTION | Freq: Four times a day (QID) | ORAL | Status: DC | PRN

## 2021-10-01 MED ORDER — DILTIAZEM HCL ER COATED BEADS 180 MG PO CP24
180.0000 mg | ORAL_CAPSULE | Freq: Every day | ORAL | Status: DC
  Administered 2021-10-02 – 2021-10-22 (×21): 180 mg via ORAL
  Filled 2021-10-01 (×21): qty 1

## 2021-10-01 MED ORDER — LEVETIRACETAM 500 MG PO TABS
500.0000 mg | ORAL_TABLET | Freq: Two times a day (BID) | ORAL | Status: AC
  Administered 2021-10-01 – 2021-10-10 (×19): 500 mg via ORAL
  Filled 2021-10-01 (×19): qty 1

## 2021-10-01 MED ORDER — POLYETHYLENE GLYCOL 3350 17 G PO PACK: 17.0000 g | PACK | Freq: Every day | ORAL | Status: DC | PRN

## 2021-10-01 MED ORDER — PROCHLORPERAZINE 25 MG RE SUPP: 12.5000 mg | Freq: Four times a day (QID) | RECTAL | Status: DC | PRN

## 2021-10-01 MED ORDER — PROCHLORPERAZINE MALEATE 5 MG PO TABS: 5.0000 mg | ORAL_TABLET | Freq: Four times a day (QID) | ORAL | Status: DC | PRN

## 2021-10-01 MED ORDER — SORBITOL 70 % SOLN: 30.0000 mL | Freq: Every day | Status: DC | PRN

## 2021-10-01 MED ORDER — ASPIRIN EC 81 MG PO TBEC
81.0000 mg | DELAYED_RELEASE_TABLET | Freq: Every day | ORAL | Status: DC
  Administered 2021-10-01 – 2021-10-22 (×22): 81 mg via ORAL
  Filled 2021-10-01 (×22): qty 1

## 2021-10-01 MED ORDER — ACETAMINOPHEN 325 MG PO TABS
325.0000 mg | ORAL_TABLET | ORAL | Status: DC | PRN
  Administered 2021-10-19: 650 mg via ORAL
  Filled 2021-10-01 (×3): qty 2

## 2021-10-01 MED ORDER — SORBITOL 70 % SOLN
30.0000 mL | Freq: Every day | Status: DC | PRN
  Administered 2021-10-01: 19:00:00 30 mL via ORAL
  Filled 2021-10-01: qty 30

## 2021-10-01 MED ORDER — CEFADROXIL 500 MG PO CAPS
1000.0000 mg | ORAL_CAPSULE | Freq: Two times a day (BID) | ORAL | Status: AC
  Administered 2021-10-01 – 2021-10-21 (×39): 1000 mg via ORAL
  Filled 2021-10-01 (×43): qty 2

## 2021-10-01 MED ORDER — PROCHLORPERAZINE EDISYLATE 10 MG/2ML IJ SOLN: 5.0000 mg | Freq: Four times a day (QID) | INTRAMUSCULAR | Status: DC | PRN

## 2021-10-01 MED ORDER — METOPROLOL TARTRATE 50 MG PO TABS
50.0000 mg | ORAL_TABLET | Freq: Two times a day (BID) | ORAL | Status: DC
  Administered 2021-10-01 – 2021-10-07 (×12): 50 mg via ORAL
  Filled 2021-10-01 (×12): qty 1

## 2021-10-01 MED ORDER — DIPHENHYDRAMINE HCL 12.5 MG/5ML PO ELIX: 12.5000 mg | ORAL_SOLUTION | Freq: Four times a day (QID) | ORAL | Status: DC | PRN

## 2021-10-01 MED ORDER — FLEET ENEMA 7-19 GM/118ML RE ENEM: 1.0000 | ENEMA | Freq: Once | RECTAL | Status: DC | PRN

## 2021-10-01 MED ORDER — TRAZODONE HCL 50 MG PO TABS
25.0000 mg | ORAL_TABLET | Freq: Every evening | ORAL | Status: DC | PRN
Start: 2021-10-01 — End: 2021-10-22

## 2021-10-01 MED ORDER — WARFARIN SODIUM 1 MG PO TABS
1.0000 mg | ORAL_TABLET | Freq: Once | ORAL | Status: DC
  Filled 2021-10-01: qty 1

## 2021-10-01 MED ORDER — WARFARIN SODIUM 1 MG PO TABS
1.0000 mg | ORAL_TABLET | Freq: Once | ORAL | Status: AC
  Administered 2021-10-01: 18:00:00 1 mg via ORAL
  Filled 2021-10-01: qty 1

## 2021-10-01 MED ORDER — WARFARIN SODIUM 2 MG PO TABS: 4.0000 mg | ORAL_TABLET | Freq: Once | ORAL | Status: DC

## 2021-10-01 MED ORDER — TRAZODONE HCL 50 MG PO TABS: 25.0000 mg | ORAL_TABLET | Freq: Every evening | ORAL | Status: DC | PRN

## 2021-10-01 MED ORDER — POLYETHYLENE GLYCOL 3350 17 G PO PACK
17.0000 g | PACK | Freq: Every day | ORAL | Status: DC | PRN
  Filled 2021-10-01: qty 1

## 2021-10-01 MED ORDER — ATORVASTATIN CALCIUM 10 MG PO TABS
20.0000 mg | ORAL_TABLET | Freq: Every day | ORAL | Status: DC
  Administered 2021-10-01 – 2021-10-21 (×21): 20 mg via ORAL
  Filled 2021-10-01 (×23): qty 2

## 2021-10-01 MED ORDER — ENOXAPARIN SODIUM 80 MG/0.8ML IJ SOSY
65.0000 mg | PREFILLED_SYRINGE | Freq: Two times a day (BID) | INTRAMUSCULAR | Status: AC
  Administered 2021-10-01 – 2021-10-02 (×2): 65 mg via SUBCUTANEOUS
  Filled 2021-10-01 (×4): qty 0.65

## 2021-10-01 MED ORDER — WARFARIN - PHARMACIST DOSING INPATIENT: Freq: Every day | Status: DC

## 2021-10-01 NOTE — Progress Notes (Signed)
ANTICOAGULATION CONSULT NOTE - Follow Up Consult  Pharmacy Consult for enoxaparin + warfarin Indication:  Renal artery thrombosis , hx afib, possible antiphospholipid antibody syndrome  No Known Allergies  Patient Measurements: Heparin Dosing Weight: 66 kg   Vital Signs: Temp: 97.7 F (36.5 C) (12/30 0354) Temp Source: Axillary (12/30 0354) BP: 114/72 (12/30 0757) Pulse Rate: 82 (12/30 0757)  Labs: Recent Labs    09/29/21 0155 09/30/21 0540 09/30/21 1206 10/01/21 0045  HGB 10.6* 10.6*  --  10.4*  HCT 29.6* 29.7*  --  29.4*  PLT 282 291  --  299  LABPROT 19.0* 35.1* 40.7* 31.7*  INR 1.6* 3.5* 4.2* 3.1*  CREATININE 1.32*  --   --  1.19*    Estimated Creatinine Clearance: 44.3 mL/min (A) (by C-G formula based on SCr of 1.19 mg/dL (H)).   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 Pt initially on heparin infusion, now to transition to warfarin with enoxaparin bridge given new clot while on apixaban.  Patient also undergoing evaluation for possible antiphospholipid antibody syndrome.  INR 1.9 prior to warfarin start likely due to influence of apixaban and underlying mild hepatic dysfunction. INR now increasing rapidly despite lower doses at initiation.  Given acute clot, would recommend continuing Lovenox despite elevated INR unless patient demonstrates signs of bleeding.    INR has trended down to 3.1 today.  Will resume warfarin at low dose.  Goal of Therapy:  INR 2-3 Anti-Xa level 0.6-1 units/ml 4hrs after LMWH dose given Monitor platelets by anticoagulation protocol: Yes   Plan:  -Continue Enoxaparin 1mg /kg SQ BID - can stop 12/31 if INR remains >2 -Warfarin 1mg  PO x 1 dose -Daily INR -Monitor for signs and symptoms of bleed  1/32, Pharm.D.,  BCPS Clinical Pharmacist Clinical phone for 10/01/2021 from 7:30-3:00 is [phone].  **Pharmacist phone directory can be found on [email or web address] listed under [facility].  10/01/2021 11:38 AM

## 2021-10-01 NOTE — Progress Notes (Signed)
Inpatient Rehabilitation Admissions Coordinator   I spoke with patient's daughter by phone and she is in agreement to admit to CIR today. She will be in to visit her Mom this morning. I will updated acute team and TOC of plans to admit to CIR today.  Ottie Glazier, RN, MSN Rehab Admissions Coordinator [phone] 10/01/2021 9:01 AM

## 2021-10-01 NOTE — TOC Transition Note (Signed)
Transition of Care [facility]) - CM/SW Discharge Note   Patient Details  Name: Lindsey [name] MRN: [id number] Date of Birth: Mar 18, 1956  [facility] of Care [facility]) CM/SW Contact:  Kermit Balo, RN Phone Number: 10/01/2021, 12:39 PM   Clinical Narrative:    Patient is discharging to CIR today. CM signing off.    Final next level of care: IP Rehab Facility Barriers to Discharge: No Barriers Identified   Patient Goals and CMS Choice     Choice offered to / list presented to : Patient  Discharge Placement                       Discharge Plan and Services                                     Social Determinants of Health (SDOH) Interventions     Readmission Risk Interventions No flowsheet data found.

## 2021-10-01 NOTE — Progress Notes (Signed)
Received from 2central in bed with staff.  Patient appears alert with difficulty voicing words. Skin appears intact with no abrasion, ecchymosis, rash, or openings. Right side weakness noted. Incontinent to bowel and bowel.

## 2021-10-01 NOTE — Discharge Summary (Signed)
Name: Lindsey [name] MRN: [id number] DOB: 07/19/1956 65 y.o. PCP: Donnie Coffin, MD  Date of Admission: 09/26/2021  3:01 PM Date of Discharge:  10/01/2021 Attending Physician: Dr. Daryll Drown  DISCHARGE DIAGNOSIS:  Primary Problem: Pyelonephritis   Hospital Problems: Principal Problem:   Pyelonephritis Active Problems:   Atrial fibrillation with RVR (HCC)   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HCC)    DISCHARGE MEDICATIONS:   Allergies as of 10/01/2021   No Known Allergies      Medication List     STOP taking these medications    amLODipine 5 MG tablet Commonly known as: NORVASC   apixaban 5 MG Tabs tablet Commonly known as: ELIQUIS   levofloxacin 750 MG tablet Commonly known as: LEVAQUIN       TAKE these medications    acetaminophen 500 MG tablet Commonly known as: TYLENOL Take 500 mg by mouth every 6 (six) hours as needed for moderate pain.   aspirin 81 MG EC tablet Take 1 tablet (81 mg total) by mouth daily. Swallow whole.   atorvastatin 20 MG tablet Commonly known as: LIPITOR Take 1 tablet (20 mg total) by mouth daily at 6 PM.   cefadroxil 1 g tablet Commonly known as: DURICEF Take 1 tablet (1 g total) by mouth 2 (two) times daily for 20 days.   diltiazem 180 MG 24 hr capsule Commonly known as: CARDIZEM CD Take 1 capsule (180 mg total) by mouth daily.   enoxaparin 80 MG/0.8ML injection Commonly known as: LOVENOX Inject 65U every 12 hours into the skin until INR is therapeutic on warfarin therapy Do NOT expel air bubble from syringe before giving.   levETIRAcetam 500 MG tablet Commonly known as: KEPPRA Take 1 tablet (500 mg total) by mouth every 12 (twelve) hours.   metoprolol tartrate 50 MG tablet Commonly known as: LOPRESSOR Take 1 tablet (50 mg total) by mouth 2 (two) times daily.   Multivitamin Adults 50+ Tabs Take 1 tablet by mouth daily.   ondansetron 4 MG tablet Commonly known as: ZOFRAN Take 1 tablet (4 mg total) by mouth every 12 (twelve)  hours as needed for nausea or vomiting.        DISPOSITION AND FOLLOW-UP:  Ms.Teale Abadilla was discharged from [facility] in Stable condition. 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  She was transitioned to warfarin with a Lovenox bridge during her admission.  Antibodies for antiphospholipid syndrome were collected and indicated positive lupus anticoagulant, however these were drawn while patient was on heparin and will need to be repeated.   Multifocal pyelonephritis of R kidney Patient was treated inpatient with IV cefazolin, which was transitioned to oral cefadroxil on discharge.  Please ensure that she has completed this treatment as the stop date is 10/20/2021.   A. fib with RVR Managed with Lopressor 50 mg twice daily, diltiazem 180 mg daily.   Possible seizure Questionable seizure activity was reported and patient was managed with Keppra 500 mg twice daily.   Follow-up Recommendations: Consults: Infectious disease Labs: Basic Metabolic Profile and CBC, antiphospholipid syndrome antibodies Studies: CT abdomen/pelvis to assess resolution of multifocal pyelonephritis of right kidney Medications: Antibiotic course of cefadroxil with end date of 10/20/2021.  Follow-up Appointments:  Follow-up Information     Carlyle Basques, MD Follow up on 10/20/2021.   Specialty: Infectious Diseases Why: Appointment at 9:15am  Contact information: 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  She was transferred back to [facility] secondary to acute decompensation. During this admission she was transitioned from Eliquis to warfarin with Lovenox bridge secondary to left renal artery thrombosis despite anticoagulation.  Further, antibodies for antiphospholipid syndrome were collected indicated a positive lupus anticoagulant, however these were collected while the patient was on heparin and will need to be repeated in 12 weeks.   #Multifocal pyelonephritis of R kidney Patient was transferred to [facility] on 09/24/2021 on Levaquin per ID consultation.  While in CIR, she developed acute fever and septicemia, at which time she was transferred back to [facility] and antibiotic coverage was expanded.  Due to concern of worsening of previously noted right kidney abscess, abdominal CT was obtained which showed slight improvement of abscess compared to previous imaging.  Blood cultures showed no growth.  She was treated with IV cefazolin and transitioned to oral cefadroxil at transfer back to [facility] per recommendations from infectious disease.   #A. fib with RVR Patient was transferred from [facility] due to clinical decline including new A. fib with RVR.  She was managed inpatient with Lopressor 50 mg twice daily and diltiazem drip.  Diltiazem drip was discontinued 12/28, however patient reverted back to RVR and was unable to return to CIR as result.  At this time she was transitioned to diltiazem 45 mg every 6 hours with appropriate rate control.  On discharge, this was changed to diltiazem 180 mg daily.   #Possible seizure Patient was continued on Keppra 500 mg twice daily for possible seizure activity.     DISCHARGE INSTRUCTIONS:   Discharge Instructions     Call MD for:  difficulty breathing, headache or visual disturbances   Complete by: As directed    Call MD for:  extreme fatigue   Complete by: As directed    Call MD for:  hives   Complete by: As directed     Call MD for:  persistant dizziness or light-headedness   Complete by: As directed    Call MD for:  persistant nausea and vomiting   Complete by: As directed    Call MD for:  temperature >100.4   Complete by: As directed    Diet - low sodium heart healthy   Complete by: As directed    Increase activity slowly   Complete by: As directed    Increase activity slowly   Complete by: As directed        SUBJECTIVE:  Patient was evaluated at bedside on day of discharge.  She appeared well and was preparing to eat breakfast.  Discharge Vitals:   BP 114/72 (BP Location: Right Arm)    Pulse 82    Temp 97.7 F (36.5 C) (Axillary)    Resp 18    SpO2 98%   OBJECTIVE:  Constitutional: Patient is resting comfortably in bed, no acute distress noted. Cardio: Regular rate with irregularly irregular rhythm.  No murmurs, rubs, gallops. Pulm: Clear to auscultation bilaterally.  Normal work of breathing on room air. Abdomen: Soft, nontender, nondistended. MSK: Left lower extremity with increased  circumference and warmth as compared to right lower extremity.  Patient endorses pain to the left lower extremity as well. Skin: No rashes, abrasions, lesions. Neuro: Speech is dysarthric but appropriate.  Pertinent Labs, Studies, and Procedures:  CBC Latest Ref Rng & Units 10/01/2021 09/30/2021 09/29/2021  WBC 4.0 - 10.5 K/uL 7.4 9.6 14.1(H)  Hemoglobin 12.0 - 15.0 g/dL 10.4(L) 10.6(L) 10.6(L)  Hematocrit 36.0 - 46.0 % 29.4(L) 29.7(L) 29.6(L)  Platelets 150 - 400 K/uL 299 291 282    CMP Latest Ref Rng & Units 10/01/2021 09/29/2021 09/28/2021  Glucose 70 - 99 mg/dL 419(F) 790(W) 409(B)  BUN 8 - 23 mg/dL 10 13 14   Creatinine 0.44 - 1.00 mg/dL ) 3.53(G) 9.92(E)  Sodium 135 - 145 mmol/L 130(L) 131(L) 133(L)  Potassium 3.5 - 5.1 mmol/L 3.9 4.6 3.7  Chloride 98 - 111 mmol/L 96(L) 98 102  CO2 22 - 32 mmol/L 22 23 21(L)  Calcium 8.9 - 10.3 mg/dL 2.68(T) 4.1(D) 6.2(I)  Total Protein 6.5 - 8.1 g/dL -  7.0 -  Total Bilirubin 0.3 - 1.2 mg/dL - 1.0 -  Alkaline Phos 38 - 126 U/L - 93 -  AST 15 - 41 U/L - 69(H) -  ALT 0 - 44 U/L - 24 -    CT ABDOMEN PELVIS W CONTRAST  Result Date: 09/26/2021 CLINICAL DATA:  Nausea vomiting pyelonephritis EXAM: CT ABDOMEN AND PELVIS WITH CONTRAST TECHNIQUE: Multidetector CT imaging of the abdomen and pelvis was performed using the standard protocol following bolus administration of intravenous contrast. CONTRAST:  26mL OMNIPAQUE IOHEXOL 300 MG/ML  SOLN COMPARISON:  MRI 09/19/2021, CT 09/19/2021 FINDINGS: Lower chest: Lung bases demonstrate patchy atelectasis at the bases. No pleural effusion. Cardiomegaly with biatrial enlargement. Hepatobiliary: Multiple gallbladder folds. No calcified stones. No focal hepatic abnormality. No biliary dilatation. Pancreas: Unremarkable. No pancreatic ductal dilatation or surrounding inflammatory changes. Spleen: Normal in size without focal abnormality. Adrenals/Urinary Tract: Adrenal glands are within normal limits. Multifocal hypoenhancement within the right kidney, greatest involving the upper and midpole, consistent with pyelonephritis. More focal hypodense collection at the upper pole measuring 3.1 by 2.7 cm, suspect for phlegmon or renal abscess, this is mildly decreased from the prior CT at which time the seen area measures 3.4 x 3.3 cm. There are several cysts in the left kidney. Interim development of markedly abnormal appearance of left kidney with multifocal hypoenhancement involving large portion of the upper and midpole of left kidney with relative sparing of the lower pole. Some peripheral cortical enhancement is preserved at the midpole of the left kidney. Left perinephric fat stranding. No hydronephrosis or hydroureter. The bladder is unremarkable Stomach/Bowel: The stomach is nonenlarged. No dilated small bowel. No acute bowel wall thickening. Negative appendix. Vascular/Lymphatic: Nonaneurysmal aorta with mild  atherosclerosis. New thrombus is present within the left main renal artery, series 3 image 32, and coronal series 6 image 36 through 40 on coronal views this is approximately 10 mm distal to the renal artery origin, thrombus extends to upper pole segmental vessels of the kidney. Renal veins appear patent. No suspicious adenopathy. Reproductive: Calcified uterine fibroid.  No adnexal mass. Other: Negative for pelvic effusion or free air. Musculoskeletal: No acute or significant osseous findings. IMPRESSION: 1. Interim development of markedly abnormal appearance of the left kidney with extensive areas of hypoenhancement, relative sparing of the lower pole. New finding of thrombus within the left renal artery. Suspect that the hypoenhancement in the left kidney is largely due to renal infarct given the presence of thrombus  in the artery, though there may be associated areas of pyelonephritis as well. 2. Residual patchy hypoenhancement at the right kidney, consistent with pyelonephritis. More focal hypodense collection at the upper pole right kidney is slightly smaller compared to the prior CT and may reflect phlegmon or resolving abscess (favored), with infarct also in the differential. 3. Calcified uterine fibroid Critical Value/emergent results were called by telephone at the time of interpretation on 09/26/2021 at 6:46 pm to provider Dr. Cicero Duck, Who verbally acknowledged these results. Electronically Signed   By: Donavan Foil M.D.   On: 09/26/2021 18:48   ECHOCARDIOGRAM LIMITED BUBBLE STUDY  Result Date: 09/27/2021    ECHOCARDIOGRAM LIMITED REPORT   Patient Name:   DEICY HOLLAR Date of Exam: 09/27/2021 Medical Rec #:  [id number]     Height:       64.2 in Accession #:    [id number]    Weight:       146.2 lb Date of Birth:  16-Jan-1956     BSA:          1.715 m Patient Age:    50 years      BP:           130/82 mmHg Patient Gender: F             HR:           95 bpm. Exam Location:  Inpatient Procedure:  Limited Echo, Color Doppler, Cardiac Doppler and Saline Contrast            Bubble Study Indications:    Renal artery thrombosis  History:        Patient has prior history of Echocardiogram examinations, most                 recent 09/23/2021. Stroke; Risk Factors:Hypertension.  Sonographer:    Glo Herring Referring Phys: [place]  1. Left ventricular ejection fraction, by estimation, is 60 to 65%. The left ventricle has normal function. The left ventricle has no regional wall motion abnormalities. Left ventricular diastolic function could not be evaluated.  2. Left atrial size was moderately dilated.  3. Right atrial size was mildly dilated.  4. Tricuspid valve regurgitation is moderate to severe.  5. The aortic valve is tricuspid. Aortic valve regurgitation is not visualized. No aortic stenosis is present.  6. There is moderately elevated pulmonary artery systolic pressure.  7. The inferior vena cava is normal in size with <50% respiratory variability, suggesting right atrial pressure of 8 mmHg.  8. Agitated saline contrast bubble study was negative, with no evidence of any interatrial shunt. FINDINGS  Left Ventricle: Left ventricular ejection fraction, by estimation, is 60 to 65%. The left ventricle has normal function. The left ventricle has no regional wall motion abnormalities. The left ventricular internal cavity size was normal in size. There is  no left ventricular hypertrophy. Left ventricular diastolic function could not be evaluated. Left ventricular diastolic function could not be evaluated due to atrial fibrillation. Right Ventricle: There is moderately elevated pulmonary artery systolic pressure. The tricuspid regurgitant velocity is 3.12 m/s, and with an assumed right atrial pressure of 8 mmHg, the estimated right ventricular systolic pressure is 0000000 mmHg. Left Atrium: Left atrial size was moderately dilated. Right Atrium: Right atrial size was mildly dilated.  Pericardium: There is no evidence of pericardial effusion. Mitral Valve: Mild mitral annular calcification. Tricuspid Valve: Tricuspid valve regurgitation is moderate to severe. Aortic Valve: The aortic valve is tricuspid.  Aortic valve regurgitation is not visualized. No aortic stenosis is present. Venous: The inferior vena cava is normal in size with less than 50% respiratory variability, suggesting right atrial pressure of 8 mmHg. IAS/Shunts: No atrial level shunt detected by color flow Doppler. Agitated saline contrast was given intravenously to evaluate for intracardiac shunting. Agitated saline contrast bubble study was negative, with no evidence of any interatrial shunt. LEFT VENTRICLE PLAX 2D LVIDd:         4.50 cm LVIDs:         2.90 cm LV PW:         0.90 cm LV IVS:        0.90 cm  IVC IVC diam: 1.80 cm LEFT ATRIUM         Index LA diam:    4.80 cm 2.80 cm/m  TRICUSPID VALVE TR Peak grad:   38.9 mmHg TR Vmax:        312.00 cm/s Sanda Klein MD Electronically signed by Sanda Klein MD Signature Date/Time: 09/27/2021/2:20:30 PM    Final      Signed: Farrel Gordon, D.O.  Internal Medicine Resident, PGY-1 [facility]  Pager: [phone] 11:12 AM, 10/01/2021

## 2021-10-01 NOTE — H&P (Signed)
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  She presented to the emergency department on September 19, 2021 with new onset of nausea, vomiting diaphoresis as well as change in baseline mental status as reported by her family.  She had no report of loss of consciousness or seizure type activity.  No known fever or chills.  As part of her work-up, she underwent CT scan of the abdomen pelvis which revealed multifocal pyelonephritis of the right kidney.  Urology was contacted and Dr. Jeffie Pollock reviewed the CT scan and the patient's data.  He recommended infectious disease consultation.  She was started on empiric antibiotics.  Her urine was positive for Klebsiella.  She was transitioned to oral Levaquin on 1219. On admission, CT scan of the head was also performed which was negative.  Due to worsening speech, MRI was performed on 12/21 which revealed an acute moderate sized left AICA cerebellar infarct.  Underlying extensive chronic left MCA territory encephalomalacia related to the 2021 infarct was noted.  Associated left brainstem wallerian degeneration since last year was noted.  Small superimposed chronic left PICA cerebellar infarcts were new since last year.  She developed elevated heart rate, decreased responsiveness and foaming at her mouth on 12/22 and rapid response was called.  Neurology evaluated and Keppra load of 1500 mg IV was given and she was started on Keppra 500 mg twice a day.  Aspirin 81 mg was added.  2D echo was completed and was negative.  Ejection fraction was estimated at 50 to 55%.   Patient was admitted to physical medicine and rehab inpatient services on September 24, 2021.  On the evening of 12/24, she developed mild tachycardia and fever to 102.5.  Lab work was completed at  her repeat white blood cell count revealed elevation to 26,000.  His antibiotics were broadened to include vancomycin and Zosyn.  IV fluids are started for volume support.  On 12/25, repeat CT of the abdomen pelvis were performed.  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  EKG revealed a heart rate of 139 with rapid ventricular response..  Lopressor was given intravenously.  These findings and the case were discussed with internal medicine and the patient was transferred from inpatient rehab to acute care internal medicine service.  She was started on diltiazem infusion. Eliquis was discontinued and she was started on heparin infusion.  As she was maintained on Eliquis throughout her admission to CIR, concern for antiphospholipid antibody syndrome was considered.  Work-up with cardiolipin and beta-2 glycoprotein antibodies are pending.  Lupus anticoagulant is also pending however this may not be reliable since she was receiving heparin infusion.  She remains in A. fib on review of telemetry but her rates are overall improved.  She was started on Lovenox 65 mg every 12 hours on 12/27.  Her first dose of warfarin 2 mg was administered on 12/27 and 1645 hrs.   Infectious disease consultation was obtained by Dr. Carlyle Basques. Antibiotics were narrowed back to cefazolin since no new source of infection was found.  Her white blood cell count improved.  She has been afebrile for approximately 48 hours.   Laboratory work-up revealed acute kidney injury with increase  in serum creatinine from 0.79 on 12/23 to 1.61 on 12/24.  Trended upward to 1.74 on 12/26.  Today, her serum creatinine is 1.32.  She had 1800 cc of urine output yesterday.   Patient requires inpatient medicine and rehabilitation evaluations and services for ongoing dysfunction secondary to cerebellar stroke.  She lives at home with her  daughter who provides care.     Review of Systems  Unable to perform ROS: Patient nonverbal      Past Medical History:  Diagnosis Date   Diverticulosis     Hypertension     Uterine prolapse      No past surgical history on file.      Family History  Problem Relation Age of Onset   Breast cancer Neg Hx      Social History:  reports that she has never smoked. She has never used smokeless tobacco. She reports that she does not currently use alcohol. She reports that she does not use drugs. Allergies: No Known Allergies       Medications Prior to Admission  Medication Sig Dispense Refill   acetaminophen (TYLENOL) 500 MG tablet Take 500 mg by mouth every 6 (six) hours as needed for moderate pain.       amLODipine (NORVASC) 5 MG tablet Take 1 tablet (5 mg total) by mouth daily. (Patient not taking: Reported on 09/19/2021) 30 tablet 3   apixaban (ELIQUIS) 5 MG TABS tablet Take 1 tablet (5 mg total) by mouth 2 (two) times daily. 180 tablet 0   aspirin EC 81 MG EC tablet Take 1 tablet (81 mg total) by mouth daily. Swallow whole. 30 tablet 0   atorvastatin (LIPITOR) 20 MG tablet Take 1 tablet (20 mg total) by mouth daily at 6 PM. 90 tablet 3   levETIRAcetam (KEPPRA) 500 MG tablet Take 1 tablet (500 mg total) by mouth every 12 (twelve) hours. 60 tablet 0   levofloxacin (LEVAQUIN) 750 MG tablet Take 1 tablet (750 mg total) by mouth daily for 27 days. 27 tablet 0   metoprolol tartrate (LOPRESSOR) 50 MG tablet Take 1 tablet (50 mg total) by mouth 2 (two) times daily. 60 tablet 0   Multiple Vitamins-Minerals (MULTIVITAMIN ADULTS 50+) TABS Take 1 tablet by mouth daily.       ondansetron (ZOFRAN) 4 MG tablet Take 1 tablet (4 mg total) by mouth every 12 (twelve) hours as needed for nausea or vomiting. 20 tablet 0      Drug Regimen Review  Drug regimen was reviewed and remains appropriate with no significant issues identified   Home: Home Living Family/patient expects to be discharged to::  Inpatient rehab Living Arrangements: Children Available Help at Discharge: Family, Available PRN/intermittently Type of Home: House  Lives With: Daughter   Functional History: Prior Function Prior Level of Function : Needs assist  Cognitive Assist : ADLs (cognitive) ADLs (Cognitive): Intermittent cues Physical Assist : Mobility (physical), ADLs (physical) Mobility (physical): Gait, Stairs ADLs (physical): Grooming, Bathing, Dressing, Toileting, IADLs Mobility Comments: Baseline function, pt ambulatory with SPC and knee brace; requires assist on carpet due to reduced R foot clearance ADLs Comments: needs assistance with bathing, dressing, grooming, setup for feeding   Functional Status:  Mobility: Bed Mobility Overal bed mobility: Needs Assistance Bed Mobility: Supine to Sit Supine to sit: Mod assist, HOB elevated General bed mobility comments: Pt initiating well with LUE/LLE, modA for RLE management and trunk elevation Transfers Overall transfer level: Needs assistance Equipment used: 1 person hand held assist Transfers:  Sit to/from Stand, Bed to chair/wheelchair/BSC Sit to Stand: Mod assist Step pivot transfers: Mod assist General transfer comment: ModA for trunk elevation standing from EOB, pivotal steps from bed to recliner pt reaching to armrests for LUE support in order to shift body weight, difficulty taking complete steps with RLE   ADL:   Cognition: Cognition Overall Cognitive Status: Difficult to assess Orientation Level: Oriented to person, Oriented to place, Disoriented to time, Disoriented to situation Cognition Arousal/Alertness: Awake/alert Behavior During Therapy: WFL for tasks assessed/performed, Flat affect Overall Cognitive Status: Difficult to assess Area of Impairment: Attention, Following commands, Awareness, Problem solving Current Attention Level: Selective Following Commands: Follows one step commands with increased time Awareness:  Emergent Problem Solving: Slow processing, Decreased initiation, Requires verbal cues, Requires tactile cues General Comments: Pt with dysarthria and aphasia, answering yes/no questions appropriately, able to answer some simple questions when asked and given options (legs up or down), requires repetition with this. Pt with good awareness of transfer set-up, cues for sequencing and safety; pleasant and motivated for OOB Difficult to assess due to: Impaired communication   Physical Exam: Blood pressure 137/77, pulse 88, temperature 98.5 F (36.9 C), temperature source Oral, resp. rate (!) 26, SpO2 96 %. Physical Exam Constitutional:      General: She is not in acute distress.    Appearance: She is not ill-appearing.  HENT:     Head: Normocephalic.     Right Ear: External ear normal.     Left Ear: External ear normal.     Nose: Nose normal.     Mouth/Throat:     Mouth: Mucous membranes are moist.  Eyes:     General:        Right eye: No discharge.        Left eye: No discharge.  Cardiovascular:     Rate and Rhythm: Normal rate. Rhythm irregular.     Heart sounds: No murmur heard.   No gallop.  Pulmonary:     Effort: Pulmonary effort is normal. No respiratory distress.     Breath sounds: No wheezing.  Abdominal:     General: There is no distension.     Tenderness: There is no abdominal tenderness.  Musculoskeletal:     Cervical back: Normal range of motion.  Neurological:     Mental Status: She is alert.     Comments: Pt is alert. Follows simple basic commands. Nods head "yes/no" to simple questions. Exp>rec aphasia. Did not engage much however during exam due to mood. RUE tr/5. RLE 1-2/5 prox to 0-trace distally (poor effort). Moves left arm and leg freely. Decreased pain sense Right arm and leg. Does have resting flexor tone right wrist and elbow.   Psychiatric:     Comments: Irritable, didn't want to make eye contact.       Lab Results Last 48 Hours        Results for  orders placed or performed during the hospital encounter of 09/26/21 (from the past 48 hour(s))  Comprehensive metabolic panel     Status: Abnormal    Collection Time: 09/27/21  8:37 AM  Result Value Ref Range    Sodium 136 135 - 145 mmol/L    Potassium 3.8 3.5 - 5.1 mmol/L    Chloride 102 98 - 111 mmol/L    CO2 23 22 - 32 mmol/L    Glucose, Bld 101 (H) 70 - 99 mg/dL      Comment: Glucose reference range applies only to samples taken  after fasting for at least 8 hours.    BUN 17 8 - 23 mg/dL    Creatinine, Ser 1.74 (H) 0.44 - 1.00 mg/dL    Calcium 8.9 8.9 - 10.3 mg/dL    Total Protein 7.7 6.5 - 8.1 g/dL    Albumin 2.4 (L) 3.5 - 5.0 g/dL    AST 71 (H) 15 - 41 U/L    ALT 32 0 - 44 U/L    Alkaline Phosphatase 75 38 - 126 U/L    Total Bilirubin 1.1 0.3 - 1.2 mg/dL    GFR, Estimated 32 (L) >60 mL/min      Comment: (NOTE) Calculated using the CKD-EPI Creatinine Equation (2021)      Anion gap 11 5 - 15      Comment: Performed at [facility], [place] [address], [place], [place] [address]  Protime-INR     Status: Abnormal    Collection Time: 09/27/21  8:37 AM  Result Value Ref Range    Prothrombin Time 24.7 (H) 11.4 - 15.2 seconds    INR 2.2 (H) 0.8 - 1.2      Comment: (NOTE) INR goal varies based on device and disease states. Performed at [facility], [place] [address] [place], [place] [address]    APTT     Status: Abnormal    Collection Time: 09/27/21  8:37 AM  Result Value Ref Range    aPTT 92 (H) 24 - 36 seconds      Comment:        IF BASELINE aPTT IS ELEVATED, SUGGEST PATIENT RISK ASSESSMENT BE USED TO DETERMINE APPROPRIATE ANTICOAGULANT THERAPY. Performed at [facility], [place] [address], [place], [place] [address]    CBC     Status: Abnormal    Collection Time: 09/27/21  8:37 AM  Result Value Ref Range    WBC 26.3 (H) 4.0 - 10.5 K/uL    RBC 3.43 (L) 3.87 - 5.11 MIL/uL    Hemoglobin 10.1 (L) 12.0 - 15.0 g/dL    HCT 29.3 (L) 36.0 - 46.0 %    MCV 85.4  80.0 - 100.0 fL    MCH 29.4 26.0 - 34.0 pg    MCHC 34.5 30.0 - 36.0 g/dL    RDW 15.2 11.5 - 15.5 %    Platelets 302 150 - 400 K/uL    nRBC 0.0 0.0 - 0.2 %      Comment: Performed at [facility], [place] [address], [place], [place] [address]  Lactic acid, plasma     Status: Abnormal    Collection Time: 09/27/21  8:37 AM  Result Value Ref Range    Lactic Acid, Venous 2.0 (HH) 0.5 - 1.9 mmol/L      Comment: CRITICAL VALUE NOTED.  VALUE IS CONSISTENT WITH PREVIOUSLY REPORTED AND CALLED VALUE. Performed at [facility], [place] [address] [place], [place] [address]    Heparin level (unfractionated)     Status: Abnormal    Collection Time: 09/27/21  8:37 AM  Result Value Ref Range    Heparin Unfractionated >1.10 (H) 0.30 - 0.70 IU/mL      Comment: (NOTE) The clinical reportable range upper limit is being lowered to >1.10 to align with the FDA approved guidance for the current laboratory assay.   If heparin results are below expected values, and patient dosage has  been confirmed, suggest follow up testing of antithrombin III levels. Performed at [facility], [place]  [address] [place], [place] [address]    Urinalysis, Routine w reflex microscopic     Status: Abnormal    Collection Time: 09/27/21  2:19 PM  Result Value Ref Range    Color, Urine YELLOW YELLOW    APPearance CLEAR CLEAR    Specific Gravity, Urine 1.025 1.005 - 1.030    pH 6.0 5.0 - 8.0    Glucose, UA NEGATIVE NEGATIVE mg/dL    Hgb urine dipstick LARGE (A) NEGATIVE    Bilirubin Urine NEGATIVE NEGATIVE    Ketones, ur NEGATIVE NEGATIVE mg/dL    Protein, ur 245 (A) NEGATIVE mg/dL    Nitrite NEGATIVE NEGATIVE    Leukocytes,Ua NEGATIVE NEGATIVE      Comment: Performed at [facility], [address], [place], [place] [address]  Urinalysis, Microscopic (reflex)     Status: Abnormal    Collection Time: 09/27/21  2:19 PM  Result Value Ref Range    RBC / HPF >50 0 - 5 RBC/hpf    WBC, UA 0-5 0 - 5 WBC/hpf     Bacteria, UA FEW (A) NONE SEEN    Squamous Epithelial / LPF 0-5 0 - 5      Comment: Performed at [facility], [address], [place], [place] [address]  APTT     Status: Abnormal    Collection Time: 09/27/21  8:08 PM  Result Value Ref Range    aPTT 50 (H) 24 - 36 seconds      Comment:        IF BASELINE aPTT IS ELEVATED, SUGGEST PATIENT RISK ASSESSMENT BE USED TO DETERMINE APPROPRIATE ANTICOAGULANT THERAPY. Performed at [facility], [address] [place], [place] [address]    Basic metabolic panel     Status: Abnormal    Collection Time: 09/28/21  3:25 AM  Result Value Ref Range    Sodium 133 (L) 135 - 145 mmol/L    Potassium 3.7 3.5 - 5.1 mmol/L    Chloride 102 98 - 111 mmol/L    CO2 21 (L) 22 - 32 mmol/L    Glucose, Bld 113 (H) 70 - 99 mg/dL      Comment: Glucose reference range applies only to samples taken after fasting for at least 8 hours.    BUN 14 8 - 23 mg/dL    Creatinine, Ser 6.73 (H) 0.44 - 1.00 mg/dL    Calcium 8.7 (L) 8.9 - 10.3 mg/dL    GFR, Estimated 44 (L) >60 mL/min      Comment: (NOTE) Calculated using the CKD-EPI Creatinine Equation (2021)      Anion gap 10 5 - 15      Comment: Performed at [facility], [address] [place], [place] [address]  APTT     Status: Abnormal    Collection Time: 09/28/21  3:25 AM  Result Value Ref Range    aPTT 78 (H) 24 - 36 seconds      Comment:        IF BASELINE aPTT IS ELEVATED, SUGGEST PATIENT RISK ASSESSMENT BE USED TO DETERMINE APPROPRIATE ANTICOAGULANT THERAPY. Performed at [facility], [address], [place], [place] [address]    Heparin level (unfractionated)     Status: Abnormal    Collection Time: 09/28/21  3:25 AM  Result Value Ref Range    Heparin Unfractionated >1.10 (H) 0.30 - 0.70 IU/mL      Comment: (NOTE) The clinical reportable range upper limit is being  lowered to >1.10 to align with the FDA approved guidance for the current laboratory assay.   If heparin  results are below expected values, and patient dosage has  been confirmed, suggest follow up testing of antithrombin III levels. Performed at [facility], [place] [address] [place], [place] [address]    CBC     Status: Abnormal    Collection Time: 09/28/21  3:25 AM  Result Value Ref Range    WBC 19.5 (H) 4.0 - 10.5 K/uL    RBC 3.47 (L) 3.87 - 5.11 MIL/uL    Hemoglobin 9.9 (L) 12.0 - 15.0 g/dL    HCT 28.8 (L) 36.0 - 46.0 %    MCV 83.0 80.0 - 100.0 fL    MCH 28.5 26.0 - 34.0 pg    MCHC 34.4 30.0 - 36.0 g/dL    RDW 15.1 11.5 - 15.5 %    Platelets 300 150 - 400 K/uL    nRBC 0.0 0.0 - 0.2 %      Comment: Performed at [facility], [place] [address] [place], [place] [address]  Protime-INR     Status: Abnormal    Collection Time: 09/28/21  3:25 AM  Result Value Ref Range    Prothrombin Time 21.7 (H) 11.4 - 15.2 seconds    INR 1.9 (H) 0.8 - 1.2      Comment: (NOTE) INR goal varies based on device and disease states. Performed at [facility], [place] [address] [place], [place] [address]    Glucose, capillary     Status: Abnormal    Collection Time: 09/28/21  6:41 AM  Result Value Ref Range    Glucose-Capillary 125 (H) 70 - 99 mg/dL      Comment: Glucose reference range applies only to samples taken after fasting for at least 8 hours.    Comment 1 Notify RN      Comment 2 Document in Chart    CBC     Status: Abnormal    Collection Time: 09/29/21  1:55 AM  Result Value Ref Range    WBC 14.1 (H) 4.0 - 10.5 K/uL    RBC 3.60 (L) 3.87 - 5.11 MIL/uL    Hemoglobin 10.6 (L) 12.0 - 15.0 g/dL    HCT 29.6 (L) 36.0 - 46.0 %    MCV 82.2 80.0 - 100.0 fL    MCH 29.4 26.0 - 34.0 pg    MCHC 35.8 30.0 - 36.0 g/dL    RDW 15.1 11.5 - 15.5 %    Platelets 282 150 - 400 K/uL    nRBC 0.0 0.0 - 0.2 %      Comment: Performed at [facility], [place] [address], [place], [place] [address]  Magnesium     Status: None    Collection Time: 09/29/21  1:55 AM  Result Value Ref Range     Magnesium 1.7 1.7 - 2.4 mg/dL      Comment: Performed at [place] [address], [place], [place] [address]  Protime-INR     Status: Abnormal    Collection Time: 09/29/21  1:55 AM  Result Value Ref Range    Prothrombin Time 19.0 (H) 11.4 - 15.2 seconds    INR 1.6 (H) 0.8 - 1.2      Comment: (NOTE) INR goal varies based on device and disease states. Performed at [facility], [place] [address] [place], [place] [address]    Comprehensive metabolic panel  Status: Abnormal    Collection Time: 09/29/21  1:55 AM  Result Value Ref Range    Sodium 131 (L) 135 - 145 mmol/L    Potassium 4.6 3.5 - 5.1 mmol/L      Comment: DELTA CHECK NOTED    Chloride 98 98 - 111 mmol/L    CO2 23 22 - 32 mmol/L    Glucose, Bld 134 (H) 70 - 99 mg/dL      Comment: Glucose reference range applies only to samples taken after fasting for at least 8 hours.    BUN 13 8 - 23 mg/dL    Creatinine, Ser 1.32 (H) 0.44 - 1.00 mg/dL    Calcium 8.6 (L) 8.9 - 10.3 mg/dL    Total Protein 7.0 6.5 - 8.1 g/dL    Albumin 2.2 (L) 3.5 - 5.0 g/dL    AST 69 (H) 15 - 41 U/L    ALT 24 0 - 44 U/L    Alkaline Phosphatase 93 38 - 126 U/L    Total Bilirubin 1.0 0.3 - 1.2 mg/dL    GFR, Estimated 45 (L) >60 mL/min      Comment: (NOTE) Calculated using the CKD-EPI Creatinine Equation (2021)      Anion gap 10 5 - 15      Comment: Performed at [facility], [place] [address] [place], [place] [address]  Glucose, capillary     Status: Abnormal    Collection Time: 09/29/21  6:30 AM  Result Value Ref Range    Glucose-Capillary 104 (H) 70 - 99 mg/dL      Comment: Glucose reference range applies only to samples taken after fasting for at least 8 hours.       Imaging Results (Last 48 hours)  ECHOCARDIOGRAM LIMITED BUBBLE STUDY   Result Date: 09/27/2021    ECHOCARDIOGRAM LIMITED REPORT   Patient Name:   ZEINA TALAMANTES Date of Exam: 09/27/2021 Medical Rec #:  [id number]     Height:       64.2 in Accession #:     [id number]    Weight:       146.2 lb Date of Birth:  [birth year]     BSA:          1.715 m Patient Age:    70 years      BP:           130/82 mmHg Patient Gender: F             HR:           95 bpm. Exam Location:  Inpatient Procedure: Limited Echo, Color Doppler, Cardiac Doppler and Saline Contrast            Bubble Study Indications:    Renal artery thrombosis  History:        Patient has prior history of Echocardiogram examinations, most                 recent 09/23/2021. Stroke; Risk Factors:Hypertension.  Sonographer:    Glo Herring Referring Phys: [place]  1. Left ventricular ejection fraction, by estimation, is 60 to 65%. The left ventricle has normal function. The left ventricle has no regional wall motion abnormalities. Left ventricular diastolic function could not be evaluated.  2. Left atrial size was moderately dilated.  3. Right atrial size was mildly dilated.  4. Tricuspid valve regurgitation is moderate to severe.  5. The aortic valve is tricuspid. Aortic valve regurgitation is not visualized. No  aortic stenosis is present.  6. There is moderately elevated pulmonary artery systolic pressure.  7. The inferior vena cava is normal in size with <50% respiratory variability, suggesting right atrial pressure of 8 mmHg.  8. Agitated saline contrast bubble study was negative, with no evidence of any interatrial shunt. FINDINGS  Left Ventricle: Left ventricular ejection fraction, by estimation, is 60 to 65%. The left ventricle has normal function. The left ventricle has no regional wall motion abnormalities. The left ventricular internal cavity size was normal in size. There is  no left ventricular hypertrophy. Left ventricular diastolic function could not be evaluated. Left ventricular diastolic function could not be evaluated due to atrial fibrillation. Right Ventricle: There is moderately elevated pulmonary artery systolic pressure. The tricuspid regurgitant velocity is 3.12  m/s, and with an assumed right atrial pressure of 8 mmHg, the estimated right ventricular systolic pressure is 0000000 mmHg. Left Atrium: Left atrial size was moderately dilated. Right Atrium: Right atrial size was mildly dilated. Pericardium: There is no evidence of pericardial effusion. Mitral Valve: Mild mitral annular calcification. Tricuspid Valve: Tricuspid valve regurgitation is moderate to severe. Aortic Valve: The aortic valve is tricuspid. Aortic valve regurgitation is not visualized. No aortic stenosis is present. Venous: The inferior vena cava is normal in size with less than 50% respiratory variability, suggesting right atrial pressure of 8 mmHg. IAS/Shunts: No atrial level shunt detected by color flow Doppler. Agitated saline contrast was given intravenously to evaluate for intracardiac shunting. Agitated saline contrast bubble study was negative, with no evidence of any interatrial shunt. LEFT VENTRICLE PLAX 2D LVIDd:         4.50 cm LVIDs:         2.90 cm LV PW:         0.90 cm LV IVS:        0.90 cm  IVC IVC diam: 1.80 cm LEFT ATRIUM         Index LA diam:    4.80 cm 2.80 cm/m  TRICUSPID VALVE TR Peak grad:   38.9 mmHg TR Vmax:        312.00 cm/s Dani Gobble Croitoru MD Electronically signed by Sanda Klein MD Signature Date/Time: 09/27/2021/2:20:30 PM    Final              Medical Problem List and Plan: 1. 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 She'll need some motivation at times to participate 2.  Antithrombotics: -DVT/anticoagulation:  INR 3.1 today, continue lovenox coverage with coumadin. If INR >2.0 12/31 can stop lovenox. Appreciate pharmacy help             -antiplatelet therapy: Aspirin 81 mg daily 3. Pain Management: Tylenol 4. Mood: LCSW to evaluate and provide emotional support              -antipsychotic agents: N/A             -daughter supportive. Pt is unhappy about still being in the hospital. Will need encouragement and positive reinforcement from team. 5. Neuropsych: This patient is not capable of making decisions on her own behalf. 6. Skin/Wound Care: Routine skin care checks 7. Fluids/Electrolytes/Nutrition: Ins and outs and follow-up chemistries. 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  Pending hypercoagulable labs, primary care provider could consider transitioning back to NOAC if the work-up is negative.  Consider hematology referral as outpatient. 9.  Atrial fibrillation with RVR: amlodipine held. Diltiazem infusion weaned. HE improved -- metoprolol 50 mg BID -HR elevated to 150 this morning with PT. Observe for patterns.  -consider resuming amlodipine (home med) bp permitting 10: Klebsiella positive, multifocal pyelonephritis with abscess: Infectious disease recommend transition from intravenous cefazolin to cefadroxil twice a day on discharge to complete a 4-week treatment course.  Stop date of the 10/20/2021. 11: Possible seizure activity:  --Keppra 12: Hypertension: stable --metoprolol 50 mg BID -- Resume home amlodipine as needed 13: AKI: baseline Scr> 0.70. 1.74 on 12/26, 1.32 on 12/28. Monitor trend. 14.  Mild anemia: No indication for transfusion.  Monitor H&H.        Barbie Banner, PA-C 09/29/2021   I have personally performed a face to face diagnostic evaluation of this patient and formulated the key components of the plan.  Additionally, I have personally reviewed laboratory data, imaging studies, as well as relevant notes and concur with the physician assistant's documentation above.  The patient's status has not changed from the original H&P.  Any changes in documentation from the acute care chart have been noted above.  Meredith Staggers, MD, [facility]

## 2021-10-01 NOTE — Progress Notes (Signed)
Physical Therapy Treatment Patient Details Name: Lindsey [name] MRN: [id number] DOB: November 11, 1955 Today's Date: 10/01/2021   History of Present Illness Pt is a 65 y.o. 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 PMH includes L MCA stroke (residual R-side hemiplegia, aphasia), afib, HTN.    PT Comments    The pt was agreeable to session with focus on OOB mobility and transfers. The pt was able to complete sit-stand with modA of 2 consistently, but does require blocking of R knee and monitoring of HR with activity. The pt was able to maintain standing with HR mostly 120-140s, but had high of 152bpm with transfer. The pt was then assisted to pivot to recliner, but continues to require maxA to maintain balance while assisting with advancement of each LE. Continue to recommend return to acute inpatient rehab for therapy to maximize functional independence.     Recommendations for follow up therapy are one component of a multi-disciplinary discharge planning process, led by the attending physician.  Recommendations may be updated based on patient status, additional functional criteria and insurance authorization.  Follow Up Recommendations  Acute inpatient rehab (3hours/day)     Assistance Recommended at Discharge Frequent or constant Supervision/Assistance  Equipment Recommendations  None recommended by PT    Recommendations for Other Services       Precautions / Restrictions Precautions Precautions: Fall;Other (comment) Precaution Comments: Baseline R-side hemiplegia; urine incontinence Required Braces or Orthoses: Other Brace;Splint/Cast Splint/Cast: Baseline RUE resting hand splint Other Brace: Baseline R AFO for  ambulation Restrictions Weight Bearing Restrictions: No     Mobility  Bed Mobility Overal bed mobility: Needs Assistance Bed Mobility: Supine to Sit     Supine to sit: Mod assist;+2 for safety/equipment;HOB elevated     General bed mobility comments: Mod A x 1-2, pt able to assist bringing BLE to EOB with light assist, increased assist needed to lift trunk with cues to use L UE on bedrail    Transfers Overall transfer level: Needs assistance Equipment used: 2 person hand held assist Transfers: Bed to chair/wheelchair/BSC;Sit to/from Stand Sit to Stand: Mod assist;+2 safety/equipment Stand pivot transfers: Max assist;+2 physical assistance;+2 safety/equipment         General transfer comment: Mod A x 1-2 to stand from bedside, cues for hand placement of L UE; completed 3 trials to assess HR response and on 3rd trial, completed pivot to chair. Pt required assist to block R knee into extension and manual assist to advance L foot with difficulty noted. Pt able to assist in reaching to armrest with L UE and therapists to swing hips into chair    Ambulation/Gait               General Gait Details: pt unable to generate clearance with LLE, RLE buckling and needed blocking, pivot with hips to recliner    Modified Rankin (Stroke Patients Only) Modified Rankin (Stroke Patients Only) Pre-Morbid Rankin Score: Moderately severe disability Modified Rankin: Moderately severe disability     Balance Overall balance assessment: Needs assistance Sitting-balance support: No upper extremity supported;Feet supported Sitting balance-Leahy Scale: Poor Sitting balance - Comments: intermittent posterior LOB sitting EOB - with cues able to correct and lean forward   Standing balance support: Single extremity supported;During functional activity Standing balance-Leahy Scale: Poor  Cognition Arousal/Alertness: Awake/alert Behavior During Therapy:  Flat affect Overall Cognitive Status: Difficult to assess Area of Impairment: Attention;Following commands;Awareness;Problem solving;Memory;Safety/judgement                   Current Attention Level: Selective Memory: Decreased short-term memory Following Commands: Follows one step commands with increased time;Follows one step commands consistently Safety/Judgement: Decreased awareness of safety;Decreased awareness of deficits Awareness: Emergent Problem Solving: Slow processing;Decreased initiation;Requires verbal cues;Requires tactile cues General Comments: H/o dysarthria and aphasia; requires repetition of cues/questions for answers (even yes/no). Assisted pt to figure four position for doffing L sock with pt not initiating to follow these one step commands.        Exercises      General Comments General comments (skin integrity, edema, etc.): HR 95bpm on entry with pt at rest in bed, increases to 151-152bpm with activity and decreases to low 100s with seated rest break. Pt able to endorse feeling when HR goes up, reports "yes" to this making her head feel funny      Pertinent Vitals/Pain Pain Assessment: No/denies pain Pain Intervention(s): Monitored during session;Limited activity within patient's tolerance     PT Goals (current goals can now be found in the care plan section) Acute Rehab PT Goals Patient Stated Goal: Hopeful for return to CIR for rehab PT Goal Formulation: With patient/family Time For Goal Achievement: 10/12/21 Potential to Achieve Goals: Good Progress towards PT goals: Progressing toward goals    Frequency    Min 3X/week      PT Plan Current plan remains appropriate    Co-evaluation PT/OT/SLP Co-Evaluation/Treatment: Yes Reason for Co-Treatment: Complexity of the patient's impairments (multi-system involvement);Necessary to address cognition/behavior during functional activity;For patient/therapist safety;To address functional/ADL  transfers PT goals addressed during session: Balance;Mobility/safety with mobility;Strengthening/ROM OT goals addressed during session: ADL's and self-care      AM-PAC PT "6 Clicks" Mobility   Outcome Measure  Help needed turning from your back to your side while in a flat bed without using bedrails?: A Lot Help needed moving from lying on your back to sitting on the side of a flat bed without using bedrails?: A Lot Help needed moving to and from a bed to a chair (including a wheelchair)?: Total Help needed standing up from a chair using your arms (e.g., wheelchair or bedside chair)?: Total Help needed to walk in hospital room?: Total Help needed climbing 3-5 steps with a railing? : Total 6 Click Score: 8    End of Session Equipment Utilized During Treatment: Gait belt Activity Tolerance: Patient tolerated treatment well;Patient limited by fatigue Patient left: in chair;with call bell/phone within reach;with chair alarm set Nurse Communication: Mobility status;Other (comment) PT Visit Diagnosis: Other abnormalities of gait and mobility (R26.89);Muscle weakness (generalized) (M62.81);Other symptoms and signs involving the nervous system (R29.898);Hemiplegia and hemiparesis Hemiplegia - Right/Left: Right Hemiplegia - dominant/non-dominant: Dominant Hemiplegia - caused by: Cerebral infarction     Time: 1011-1036 PT Time Calculation (min) (ACUTE ONLY): 25 min  Charges:  $Therapeutic Activity: 8-22 mins                     Vickki Muff, PT, DPT   Acute Rehabilitation Department Pager #: [phone]   Ronnie Derby 10/01/2021, 11:19 AM

## 2021-10-01 NOTE — Progress Notes (Signed)
Occupational Therapy Treatment Patient Details Name: Lindsey [name] MRN: [id number] DOB: December 18, 1955 Today's Date: 10/01/2021   History of present illness Pt is a 65 y.o. 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 PMH includes L MCA stroke (residual R-side hemiplegia, aphasia), afib, HTN.   OT comments  Pt progressing gradually towards OT goals, limited by increasing HR (up to 151-152bpm) with activity and balance deficits requiring extensive assist for LB ADL completion and basic transfers. Pt requires assist to block R knee and assist in manuevering L foot for pivots, so +2 assist helpful for safety to decrease fall risk. DC recs remain appropriate.    Recommendations for follow up therapy are one component of a multi-disciplinary discharge planning process, led by the attending physician.  Recommendations may be updated based on patient status, additional functional criteria and insurance authorization.    Follow Up Recommendations  Acute inpatient rehab (3hours/day)    Assistance Recommended at Discharge Frequent or constant Supervision/Assistance  Equipment Recommendations  None recommended by OT    Recommendations for Other Services Rehab consult    Precautions / Restrictions Precautions Precautions: Fall;Other (comment) Precaution Comments: Baseline R-side hemiplegia; urine incontinence Required Braces or Orthoses: Other Brace;Splint/Cast Splint/Cast: Baseline RUE resting hand splint Other Brace: Baseline R AFO for ambulation Restrictions Weight Bearing Restrictions: No       Mobility Bed Mobility Overal bed mobility: Needs Assistance Bed Mobility: Supine to Sit     Supine to sit: Mod assist;+2 for  safety/equipment;HOB elevated     General bed mobility comments: Mod A x 1-2, pt able to assist bringing BLE to EOB with light assist, increased assist needed to lift trunk with cues to use L UE on bedrail    Transfers Overall transfer level: Needs assistance Equipment used: 2 person hand held assist Transfers: Bed to chair/wheelchair/BSC;Sit to/from Stand Sit to Stand: Mod assist;+2 safety/equipment Stand pivot transfers: Max assist;+2 physical assistance;+2 safety/equipment         General transfer comment: Mod A x 1-2 to stand from bedside, cues for hand placement of L UE; completed 3 trials to assess HR response and on 3rd trial, completed pivot to chair. Pt required assist to block R knee into extension and manual assist to advance L foot with difficulty noted. Pt able to assist in reaching to armrest with L UE and therapists to swing hips into chair     Balance Overall balance assessment: Needs assistance Sitting-balance support: No upper extremity supported;Feet supported Sitting balance-Leahy Scale: Poor Sitting balance - Comments: intermittent posterior LOB sitting EOB - with cues able to correct and lean forward   Standing balance support: Single extremity supported;During functional activity Standing balance-Leahy Scale: Poor                             ADL either performed or assessed with clinical judgement   ADL Overall ADL's : Needs assistance/impaired Eating/Feeding: Set up;Sitting Eating/Feeding Details (indicate cue type and reason): assist to prepare coffee, able to lift and bring to mouth to sip                 Lower Body Dressing: Total assistance;Sit to/from stand;Bed level Lower Body Dressing Details (indicate cue type and reason): Attempted to  initiate participation in doffing L sock with figure four position in bed with pt not initiating to follow these commands. 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 though unable to specify wearing schedule RUE Coordination: decreased fine motor;decreased gross motor   Lower Extremity Assessment Lower Extremity Assessment: Defer to PT evaluation        Vision   Vision Assessment?: No apparent visual deficits   Perception     Praxis      Cognition Arousal/Alertness: Awake/alert Behavior During Therapy: Flat affect Overall Cognitive Status: Difficult to assess Area of Impairment: Attention;Following commands;Awareness;Problem solving;Memory;Safety/judgement                   Current Attention Level: Selective Memory: Decreased short-term memory Following Commands: Follows one step commands with increased time;Follows one step commands consistently Safety/Judgement: Decreased awareness of safety;Decreased awareness of deficits Awareness: Emergent Problem Solving: Slow processing;Decreased initiation;Requires verbal cues;Requires tactile cues General Comments: H/o dysarthria and aphasia; requires repetition of cues/questions for answers (even yes/no). Assisted pt to figure four position for doffing L sock with pt not initiating to follow these one step commands.          Exercises     Shoulder Instructions       General Comments HR 95bpm on entry with pt at rest in bed, increases to 151-152bpm with activity and decreases to low 100s with seated rest break. Pt able to endorse feeling when HR goes up, reports "yes" to this making her head feel funny    Pertinent Vitals/ Pain        Pain Assessment: No/denies pain Pain Intervention(s): Monitored during session  Home Living                                          Prior Functioning/Environment              Frequency  Min 2X/week        Progress Toward Goals  OT Goals(current goals can now be found in the care plan section)  Progress towards OT goals: Progressing toward goals  Acute Rehab OT Goals Patient Stated Goal: get some warm coffee, be able to go home OT Goal Formulation: With patient Time For Goal Achievement: 10/12/21 Potential to Achieve Goals: Good ADL Goals Pt Will Perform Eating: with set-up;sitting Pt Will Perform Grooming: with set-up;sitting Pt Will Transfer to Toilet: with min guard assist;stand pivot transfer;bedside commode Pt Will Perform Toileting - Clothing Manipulation and hygiene: with min assist;sit to/from stand;sitting/lateral leans Additional ADL Goal #1: Pt to complete bed mobility at min guard level in prep for ADLs  Plan Discharge plan remains appropriate    Co-evaluation    PT/OT/SLP Co-Evaluation/Treatment: Yes Reason for Co-Treatment: To address functional/ADL transfers;For patient/therapist safety   OT goals addressed during session: ADL's and self-care      AM-PAC OT "6 Clicks" Daily Activity     Outcome Measure  Help from another person eating meals?: A Little Help from another person taking care of personal grooming?: A Little Help from another person toileting, which includes using toliet, bedpan, or urinal?: Total Help from another person bathing (including washing, rinsing, drying)?: A Lot Help from another person to put on and taking off regular upper body clothing?: A Little Help from another person to put on and taking off regular lower body clothing?: Total 6 Click Score: 13    End of Session Equipment Utilized During Treatment: Gait belt  OT Visit Diagnosis: Other abnormalities of gait and mobility (R26.89);Muscle weakness  (generalized) (M62.81);Other symptoms and signs involving cognitive function   Activity Tolerance Patient tolerated treatment well   Patient Left in chair;with call bell/phone within reach;with chair alarm set (only chair pad in room, unable to locate nurse call cord to plug in; notified nursing staff during last session)   Nurse Communication Mobility status        Time: 1003-1036 OT Time Calculation (min): 33 min  Charges: OT General Charges $OT Visit: 1 Visit OT Treatments $Self Care/Home Management : 8-22 mins  Malachy Chamber, OTR/L Acute Rehab Services Office: [phone]   Layla Maw 10/01/2021, 11:01 AM

## 2021-10-01 NOTE — Plan of Care (Signed)
°  Problem: Clinical Measurements: Goal: Will remain free from infection Outcome: Progressing Goal: Diagnostic test results will improve Outcome: Progressing   Problem: Education: Goal: Knowledge of General Education information will improve Description: Including pain rating scale, medication(s)/side effects and non-pharmacologic comfort measures Outcome: Not Progressing   Problem: Health Behavior/Discharge Planning: Goal: Ability to manage health-related needs will improve Outcome: Not Progressing   Problem: Activity: Goal: Risk for activity intolerance will decrease Outcome: Not Progressing   Problem: Nutrition: Goal: Adequate nutrition will be maintained Outcome: Not Progressing

## 2021-10-01 NOTE — Progress Notes (Signed)
Meredith Staggers, MD  Physician Physical Medicine and Rehabilitation PMR Pre-admission    Signed Date of Service:  09/28/2021  9:52 AM  Related encounter: Admission (Current) from 09/26/2021 in [facility]      Show:Clear all [x] Written[x] Templated[x] Copied  Added by: [x] Cristina Gong, RN[x] Meredith Staggers, MD[x] Michel Santee, PT  [] Hover for details                                                                                                                                                                                                                                                                                                                                                                                                                                                                                                       PMR Admission Coordinator Pre-Admission Assessment   Patient: Lindsey [name] is an 65 y.o., female MRN: [id number] DOB: Oct 19, 1955 Height:   Weight:     Insurance Information HMO:     PPO:      PCP:  IPA:      80/20:      OTHER:  PRIMARY: Medicare A and B      Policy#: [id number]            Subscriber: pt CM Name:       Phone#:      Fax#:  Pre-Cert#: verified Civil engineer, contracting:  Benefits:  Phone #:      Name:  Eff. Date: A/B 03/03/21     Deduct: $1556 (2023 $1600)      Out of Pocket Max:       Life Max:  CIR: 1005      SNF: 20 full days Outpatient: 80%     Co-Ins: 20% Home Health: 100%      Co-Pay:  DME: 80%     Co-Ins: 20% Providers:  SECONDARY: Medicaid of [place]      Policy#: [id number]     Phone#:    Financial Counselor:       Phone#:    The Actuary for patients in Inpatient Rehabilitation Facilities with attached  Privacy Act [place] Records was provided and verbally reviewed with: Family   Emergency Contact Information Contact Information       Name Relation Home Work Mobile    [place] Daughter [phone]   [phone]    Claybon Jabs     [id number]         Current Medical History  Patient Admitting Diagnosis: CVA, pyelonephritis, renal infarct    History of Present Illness:  Lindsey [name] is a 65 year old female with a past medical history significant for 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  She presented to the emergency department on September 19, 2021 with new onset of nausea, vomiting diaphoresis as well as change in baseline mental status as reported by her family.  She had no report of loss of consciousness or seizure type activity.  No known fever or chills.  As part of her work-up, she underwent CT scan of the abdomen pelvis which revealed multifocal pyelonephritis of the right kidney.  Urology was contacted and Dr. Jeffie Pollock reviewed the CT scan and the patient's data.  He recommended infectious disease consultation.  She was started on empiric antibiotics.  Her urine was positive for Klebsiella.  She was transitioned to oral Levaquin on 1219. On admission, CT scan of the head was also performed which was negative.  Due to worsening speech, MRI was performed on 12/21 which revealed an acute moderate sized left AICA cerebellar infarct.  Underlying extensive chronic left MCA territory encephalomalacia related to the 2021 infarct was noted.  Associated left brainstem wallerian degeneration since last year was noted.  Small superimposed chronic left PICA cerebellar infarcts were new since last year.  She developed elevated heart rate, decreased responsiveness and foaming at her mouth on 12/22 and rapid response was called.  Neurology evaluated and Keppra load of 1500 mg IV was given and she was started on Keppra 500 mg twice a day.  Aspirin 81 mg  was added.  2D echo was completed and was negative.  Ejection fraction was estimated at 50 to 55%.   Patient was admitted to physical medicine and rehab inpatient services on September 24, 2021.  On the evening of 12/24, she developed mild tachycardia and fever to 102.5.  Lab work was completed at her repeat white blood cell count revealed elevation to 26,000.  His antibiotics were broadened to include vancomycin and Zosyn.  IV fluids are started for volume support.  On 12/25, repeat CT of the abdomen pelvis were performed.  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  EKG revealed a heart rate of 139 with rapid ventricular response..  Lopressor was given intravenously.  These findings and the case were discussed with internal medicine and the patient was transferred from inpatient rehab to acute care internal medicine service.  She was started on diltiazem infusion. Eliquis was discontinued and she was started on heparin infusion.  As she was maintained on Eliquis throughout her admission to CIR, concern for antiphospholipid antibody syndrome was considered.  Work-up with cardiolipin and beta-2 glycoprotein antibodies are pending.  Lupus anticoagulant is also pending however this may not be reliable since she was receiving heparin infusion.  She remains in A. fib on review of telemetry but her rates are overall improved.  She was started on Lovenox 65 mg every 12 hours on 12/27.  Her first dose of warfarin 2 mg was administered on 12/27 and 1645 hrs.   Infectious disease consultation was obtained by Dr. Carlyle Basques. Antibiotics were narrowed back to cefazolin since no new source of infection was found.  Her white blood cell count improved.  She has been afebrile for approximately 48 hours.   Laboratory work-up revealed acute kidney injury with increase in serum creatinine from 0.79 on 12/23 to  1.61 on 12/24.  Trended upward to 1.74 on 12/26.  Today, her serum creatinine is 1.32.  She had 1800 cc of urine output yesterday.   Complete NIHSS TOTAL: 16   Complete NIHSS TOTAL: 16   Patient's medical record from [facility] has been reviewed by the rehabilitation admission coordinator and physician.   Past Medical History      Past Medical History:  Diagnosis Date   Diverticulosis     Hypertension     Uterine prolapse      Has the patient had major surgery during 100 days prior to admission? No   Family History   family history is not on file.   Current Medications   Current Facility-Administered Medications:    acetaminophen (TYLENOL) tablet 650 mg, 650 mg, Oral, Q6H PRN **OR** acetaminophen (TYLENOL) suppository 650 mg, 650 mg, Rectal, Q6H PRN, Lacinda Axon, MD, 650 mg at 09/26/21 2245   aspirin EC tablet 81 mg, 81 mg, Oral, Daily, Virl Axe, MD, 81 mg at 10/01/21 0916   atorvastatin (LIPITOR) tablet 20 mg, 20 mg, Oral, q1800, Virl Axe, MD, 20 mg at 09/30/21 1753   ceFAZolin (ANCEF) IVPB 2g/100 mL premix, 2 g, Intravenous, Q8H, Carlyle Basques, MD, Last Rate: 200 mL/hr at 10/01/21 0549, 2 g at 10/01/21 0549   diltiazem (CARDIZEM CD) 24 hr capsule 180 mg, 180 mg, Oral, Daily, Aslam, Sadia, MD, 180 mg at 10/01/21 0917   enoxaparin (LOVENOX) injection 65 mg, 65 mg, Subcutaneous, Q12H, Einar Grad, RPH, 65 mg at 10/01/21 [id number]   levETIRAcetam (KEPPRA) tablet 500 mg, 500 mg, Oral, BID, Aslam, Sadia, MD, 500 mg at 10/01/21 0917   metoprolol tartrate (LOPRESSOR) tablet 50 mg, 50 mg, Oral, BID, Virl Axe, MD, 50 mg at 10/01/21 0916   ondansetron (ZOFRAN) tablet 4 mg, 4 mg, Oral, Q12H PRN, Virl Axe, MD   polyethylene glycol (MIRALAX / GLYCOLAX) packet 17 g, 17 g, Oral, Daily PRN, Virl Axe, MD   Warfarin - Pharmacist Dosing Inpatient, , Does not  apply, q1600, Einar Grad [facility], Given at 09/29/21 1743   Patients Current Diet:  Diet Order                   Diet - low sodium heart healthy             DIET DYS 3 Room service appropriate? Yes; Fluid consistency: Thin  Diet effective now                       Precautions / Restrictions Precautions Precautions: Fall, Other (comment) Precaution Comments: Baseline R-side hemiplegia; urine incontinence Other Brace: Baseline R AFO for ambulation Restrictions Weight Bearing Restrictions: No    Has the patient had 2 or more falls or a fall with injury in the past year? No   Prior Activity Level Household: able to ambulate household distances with [facility] and supervision, assit for ADLs from daughter   Prior Functional Level Self Care: Did the patient need help bathing, dressing, using the toilet or eating? Needed some help   Indoor Mobility: Did the patient need assistance with walking from room to room (with or without device)? Needed some help   Stairs: Did the patient need assistance with internal or external stairs (with or without device)? Dependent   Functional Cognition: Did the patient need help planning regular tasks such as shopping or remembering to take medications? Needed some help   Patient Information Are you of Hispanic, Latino/a,or Spanish origin?: A. No, not of Hispanic, Latino/a, or Spanish origin, X. Patient unable to respond (daughter responded) What is your race?: B. Black or African American, X. Patient unable to respond (daughter responded) Do you need or want an interpreter to communicate with a doctor or health care staff?: 9. Unable to respond   Patient's Response To:  Health Literacy and Transportation Is the patient able to respond to health literacy and transportation needs?: No (daughter responded) Health Literacy - How often do you need to have someone help you when you read instructions, pamphlets, or other written material from your doctor or pharmacy?: Patient unable to respond In the past 12 months, has lack of transportation kept you  from medical appointments or from getting medications?: No In the past 12 months, has lack of transportation kept you from meetings, work, or from getting things needed for daily living?: No   Development worker, international aid / [place] Devices/Equipment: None Home Equipment: Civil engineer, contracting, Wheelchair - manual, Radio producer - single point, Conservation officer, nature (2 wheels), Other (comment), Hospital bed   Prior Device Use: Indicate devices/aids used by the patient prior to current illness, exacerbation or injury? Walker and SPC   Current Functional Level Cognition   Overall Cognitive Status: Difficult to assess Difficult to assess due to: Impaired communication Current Attention Level: Selective Orientation Level: Oriented to place, Oriented to person, Disoriented to time, Disoriented to situation Following Commands: Follows one step commands with increased time Safety/Judgement: Decreased awareness of safety, Decreased awareness of deficits General Comments: pt with increased flat affect and minimal verbalizations compared to yesterday's session (seems to be related to pt wanting to d/c home instead of CIR); becoming somewhat more interactive as session continued. requires frequent cues for sequencing with transfers. H/o dysarthria and aphasia    Extremity Assessment (includes Sensation/Coordination)   Upper Extremity Assessment: RUE deficits/detail RUE Deficits / Details: h/o hemiplegia from h/o CVA (prior to most recent CVA 08/2021), 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 though unable to specify  wearing schedule RUE Coordination: decreased fine motor, decreased gross motor  Lower Extremity Assessment: Defer to PT evaluation RLE Deficits / Details: R knee flex/ext and hip flex <3/5; baseline R ankle AFO due to minimal to no ankle DF/PF activation (from h/o stroke)     ADLs   Overall ADL's : Needs assistance/impaired Grooming: Minimal assistance, Sitting Upper Body Bathing:  Moderate assistance, Sitting Upper Body Dressing : Moderate assistance, Sitting Lower Body Dressing: Maximal assistance, Sit to/from stand Lower Body Dressing Details (indicate cue type and reason): to don socks; has assist with this at baseline Toilet Transfer: Maximal assistance, Stand-pivot, BSC/3in1 Toilet Transfer Details (indicate cue type and reason): assist to advance R LE (pt reported only using AFO for mobility at home but did not need for transfers; unsure of accuracy)     Mobility   Overal bed mobility: Needs Assistance Bed Mobility: Supine to Sit Supine to sit: Mod assist, HOB elevated General bed mobility comments: Received sitting in recliner; pt declined transfer back to bed     Transfers   Overall transfer level: Needs assistance Equipment used: 1 person hand held assist Transfers: Sit to/from Stand Sit to Stand: Mod assist Bed to/from chair/wheelchair/BSC transfer type:: Stand pivot Stand pivot transfers: Max assist Step pivot transfers: Mod assist General transfer comment: Heavy modA for trunk elevation on R-side for 2x standing from recliner, repeated cues for LUE hand placement; pt declined R AFO donned for standing, noted R foot sliding with knee hyperextension upon standing     Ambulation / Gait / Stairs / Proofreader / Balance Dynamic Sitting Balance Sitting balance - Comments: able to maintain static sitting balance at edge of recliner without UE support Balance Overall balance assessment: Needs assistance Sitting-balance support: No upper extremity supported, Feet supported Sitting balance-Leahy Scale: Fair Sitting balance - Comments: able to maintain static sitting balance at edge of recliner without UE support Standing balance support: Single extremity supported, During functional activity Standing balance-Leahy Scale: Poor Standing balance comment: Heavy reliance on LUE support on RW or recliner armrest to maintain static  standing; dependent for posterior pericare     Special needs/care consideration      Previous Home Environment  Living Arrangements: Children  Lives With: Daughter Available Help at Discharge: Family, Available PRN/intermittently Type of Home: House Home Layout: Multi-level, Able to live on main level with bedroom/bathroom, 1/2 bath on main level Alternate Level Stairs-Number of Steps: flight, chair lift Home Access: Level entry Bathroom Shower/Tub: Multimedia programmer: Standard Bathroom Accessibility: Yes How Accessible: Accessible via walker Home Care Services: No Additional Comments: Information taken from chart due to patient presenting with expressive aphasia and no family present at time of evaluation. pt also reports living with sister and cousin when asked   Discharge Living Setting Plans for Discharge Living Setting: Lives with (comment) Type of Home at Discharge: House Discharge Home Layout: Two level, 1/2 bath on main level (pt sleeps downstairs but bathes upstairs) Alternate Level Stairs-Rails:  (stair lift) Alternate Level Stairs-Number of Steps: full flight Discharge Home Access: Stairs to enter, Ramped entrance Entrance Stairs-Rails: None Entrance Stairs-Number of Steps: 1 Discharge Bathroom Shower/Tub: Walk-in shower (threshold) Discharge Bathroom Toilet: Standard (BSC over toilet) Discharge Bathroom Accessibility: Yes How Accessible: Accessible via wheelchair Does the patient have any problems obtaining your medications?: No   Social/Family/Support Systems Anticipated Caregiver: pt's daughter, [name] Anticipated Caregiver's Contact Information: [phone] Ability/Limitations of Caregiver: providing care for pt prior  to admit Caregiver Availability: 24/7 Discharge Plan Discussed with Primary Caregiver: Yes Is Caregiver In Agreement with Plan?: Yes Does Caregiver/Family have Issues with Lodging/Transportation while Pt is in Rehab?: No    Goals Patient/Family Goal for Rehab: PT/OT min assist, SLP min to mod assist Expected length of stay: 12-14 Pt/Family Agrees to Admission and willing to participate: Yes Program Orientation Provided & Reviewed with Pt/Caregiver Including Roles  & Responsibilities: Yes   Decrease burden of Care through IP rehab admission: n/a   Possible need for SNF placement upon discharge: not anticipated   Patient Condition: I have reviewed medical records from [facility], spoken with  [facility] team , and patient and daughter. I discussed via phone for inpatient rehabilitation assessment.  Patient will benefit from ongoing PT, OT, and SLP, can actively participate in 3 hours of therapy a day 5 days of the week, and can make measurable gains during the admission.  Patient will also benefit from the coordinated team approach during an Inpatient Acute Rehabilitation admission.  The patient will receive intensive therapy as well as Rehabilitation physician, nursing, social worker, and care management interventions.  Due to bladder management, bowel management, safety, skin/wound care, disease management, medication administration, pain management, and patient education the patient requires 24 hour a day rehabilitation nursing.  The patient is currently mod assist overall with mobility and basic ADLs.  Discharge setting and therapy post discharge at home with home health is anticipated.  Patient has agreed to participate in the Acute Inpatient Rehabilitation Program and will admit today.   Preadmission Screen Completed By: Shann Medal, PT, DPT  Cleatrice Burke, 10/01/2021 9:36 AM ______________________________________________________________________   Discussed status with Dr. Naaman Plummer on 10/01/21 at [phone] and received approval for admission today.   Admission Coordinator: Shann Medal, PT, DPT and  Cleatrice Burke, RN, time [id number] Date 10/01/21    Assessment/Plan: Diagnosis: cva, pyelonephritis Does  the need for close, 24 hr/day Medical supervision in concert with the patient's rehab needs make it unreasonable for this patient to be served in a less intensive setting? Yes Co-Morbidities requiring supervision/potential complications: a fib with RVR, aphasia Due to bladder management, bowel management, safety, skin/wound care, disease management, medication administration, pain management, and patient education, does the patient require 24 hr/day rehab nursing? Yes Does the patient require coordinated care of a physician, rehab nurse, PT, OT, and SLP to address physical and functional deficits in the context of the above medical diagnosis(es)? Yes Addressing deficits in the following areas: balance, endurance, locomotion, strength, transferring, bowel/bladder control, bathing, dressing, feeding, grooming, toileting, cognition, speech, language, swallowing, and psychosocial support Can the patient actively participate in an intensive therapy program of at least 3 hrs of therapy 5 days a week? Yes The potential for patient to make measurable gains while on inpatient rehab is excellent Anticipated functional outcomes upon discharge from inpatient rehab: min assist PT, min assist OT, min assist and mod assist SLP Estimated rehab length of stay to reach the above functional goals is: 12-14 days Anticipated discharge destination: Home 10. Overall Rehab/Functional Prognosis: excellent     MD Signature: Meredith Staggers, MD, [place] Director Rehabilitation Services 10/01/2021          Revision History  Note Details  Author Ranelle Oyster, MD File Time 10/01/2021  9:44 AM  Author Type Physician Status Signed  Last Editor Ranelle Oyster, MD Service Physical Medicine and Rehabilitation

## 2021-10-01 NOTE — Progress Notes (Signed)
ANTICOAGULATION CONSULT NOTE - Follow Up Consult  Pharmacy Consult for enoxaparin + warfarin Indication:  Renal artery thrombosis , hx afib, possible antiphospholipid antibody syndrome  No Known Allergies  Patient Measurements: Heparin Dosing Weight: 66 kg   Vital Signs: Temp: 99.1 F (37.3 C) (12/30 1427) Temp Source: Oral (12/30 1427) BP: 112/81 (12/30 1427) Pulse Rate: 91 (12/30 1427)  Labs: Recent Labs    09/29/21 0155 09/30/21 0540 09/30/21 1206 10/01/21 0045  HGB 10.6* 10.6*  --  10.4*  HCT 29.6* 29.7*  --  29.4*  PLT 282 291  --  299  LABPROT 19.0* 35.1* 40.7* 31.7*  INR 1.6* 3.5* 4.2* 3.1*  CREATININE 1.32*  --   --  1.19*    Estimated Creatinine Clearance: 44.3 mL/min (A) (by C-G formula based on SCr of 1.19 mg/dL (H)).   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 Pt initially on heparin infusion, now to transition to warfarin with enoxaparin bridge given new clot while on apixaban.  Patient also undergoing evaluation for possible antiphospholipid antibody syndrome.  INR 1.9 prior to warfarin start likely due to influence of apixaban and underlying mild hepatic dysfunction. INR now increasing rapidly despite lower doses at initiation.  Given acute clot, would recommend continuing Lovenox despite elevated INR unless patient demonstrates signs of bleeding.    INR has trended down to 3.1 today.  Will resume warfarin at low dose.  Goal of Therapy:  INR 2-3 Anti-Xa level 0.6-1 units/ml 4hrs after LMWH dose given Monitor platelets by anticoagulation protocol: Yes   Plan:  -Continue Enoxaparin 1mg /kg SQ BID - can stop 12/31 if INR remains >2 -Warfarin 1mg  PO x 1 dose -Daily INR -Monitor for signs and symptoms of bleed  Margree Gimbel BS, PharmD, BCPS Clinical  Pharmacist **Pharmacist phone directory can be found on [email or web address] listed under [facility]. 10/01/2021 2:52 PM

## 2021-10-01 NOTE — Progress Notes (Signed)
Inpatient Rehabilitation Admission Medication Review by a Pharmacist  A complete drug regimen review was completed for this patient to identify any potential clinically significant medication issues.  High Risk Drug Classes Is patient taking? Indication by Medication  Antipsychotic Yes Compazine- N/V  Anticoagulant Yes Lovenox/Warfarin- A. 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, renal artery stenosis  Antibiotic Yes Cefadroxil- pyelonephritis  Opioid No   Antiplatelet Yes Aspirin- CVA prophylaxis  Hypoglycemics/insulin No   Vasoactive Medication Yes Cardizem CD, Lopressor - rate control  Chemotherapy No   Other Yes Lipitor- HLD Keppra- Seizure     Type of Medication Issue Identified Description of Issue Recommendation(s)  Drug Interaction(s) (clinically significant)     Duplicate Therapy     Allergy     No Medication Administration End Date     Incorrect Dose     Additional Drug Therapy Needed     Significant med changes from prior encounter (inform family/care partners about these prior to discharge).    Other  PTA med: Norvasc Restart if clinically necessary during stay on CIR or at discharge    Clinically significant medication issues were identified that warrant physician communication and completion of prescribed/recommended actions by midnight of the next day:  NO   Time spent performing this drug regimen review (minutes):  45   Hazelyn Kallen BS, PharmD, BCPS Clinical Pharmacist 10/01/2021 2:18 PM

## 2021-10-02 LAB — COMPREHENSIVE METABOLIC PANEL
ALT: 20 U/L (ref 0–44)
AST: 69 U/L — ABNORMAL HIGH (ref 15–41)
Albumin: 2.3 g/dL — ABNORMAL LOW (ref 3.5–5.0)
Alkaline Phosphatase: 84 U/L (ref 38–126)
Anion gap: 12 (ref 5–15)
BUN: 11 mg/dL (ref 8–23)
CO2: 22 mmol/L (ref 22–32)
Calcium: 8.7 mg/dL — ABNORMAL LOW (ref 8.9–10.3)
Chloride: 99 mmol/L (ref 98–111)
Creatinine, Ser: 1.29 mg/dL — ABNORMAL HIGH (ref 0.44–1.00)
GFR, Estimated: 46 mL/min — ABNORMAL LOW (ref >60)
Glucose, Bld: 107 mg/dL — ABNORMAL HIGH (ref 70–99)
Potassium: 3.7 mmol/L (ref 3.5–5.1)
Sodium: 133 mmol/L — ABNORMAL LOW (ref 135–145)
Total Bilirubin: 0.7 mg/dL (ref 0.3–1.2)
Total Protein: 7.6 g/dL (ref 6.5–8.1)

## 2021-10-02 LAB — CBC WITH DIFFERENTIAL/PLATELET
Abs Immature Granulocytes: 0.14 10*3/uL — ABNORMAL HIGH (ref 0.00–0.07)
Basophils Absolute: 0 10*3/uL (ref 0.0–0.1)
Basophils Relative: 1 %
Eosinophils Absolute: 0.1 10*3/uL (ref 0.0–0.5)
Eosinophils Relative: 2 %
HCT: 29.1 % — ABNORMAL LOW (ref 36.0–46.0)
Hemoglobin: 10.3 g/dL — ABNORMAL LOW (ref 12.0–15.0)
Immature Granulocytes: 2 %
Lymphocytes Relative: 21 %
Lymphs Abs: 1.3 10*3/uL (ref 0.7–4.0)
MCH: 29.3 pg (ref 26.0–34.0)
MCHC: 35.4 g/dL (ref 30.0–36.0)
MCV: 82.9 fL (ref 80.0–100.0)
Monocytes Absolute: 1.3 10*3/uL — ABNORMAL HIGH (ref 0.1–1.0)
Monocytes Relative: 20 %
Neutro Abs: 3.5 10*3/uL (ref 1.7–7.7)
Neutrophils Relative %: 54 %
Platelets: 315 10*3/uL (ref 150–400)
RBC: 3.51 MIL/uL — ABNORMAL LOW (ref 3.87–5.11)
RDW: 15.3 % (ref 11.5–15.5)
WBC: 6.4 10*3/uL (ref 4.0–10.5)
nRBC: 0 % (ref 0.0–0.2)

## 2021-10-02 LAB — PROTIME-INR
INR: 3.1 — ABNORMAL HIGH (ref 0.8–1.2)
Prothrombin Time: 31.9 seconds — ABNORMAL HIGH (ref 11.4–15.2)

## 2021-10-02 MED ORDER — B COMPLEX-C PO TABS
1.0000 | ORAL_TABLET | Freq: Every day | ORAL | Status: DC
  Administered 2021-10-02 – 2021-10-22 (×21): 1 via ORAL
  Filled 2021-10-02 (×21): qty 1

## 2021-10-02 MED ORDER — WARFARIN SODIUM 1 MG PO TABS
1.0000 mg | ORAL_TABLET | Freq: Once | ORAL | Status: AC
  Administered 2021-10-02: 1 mg via ORAL
  Filled 2021-10-02: qty 1

## 2021-10-02 MED ORDER — MAGNESIUM GLUCONATE 500 MG PO TABS: 250.0000 mg | ORAL_TABLET | Freq: Every day | ORAL | Status: DC

## 2021-10-02 NOTE — Evaluation (Signed)
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 No pertinent surgical history.  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  She presented to the emergency department on September 19, 2021 with new onset of nausea, vomiting diaphoresis as well as change in baseline mental status as reported by her family.  She had no report of loss of consciousness or seizure type activity.  No known fever or chills.  As part of her work-up, she underwent CT scan of the abdomen pelvis which revealed multifocal pyelonephritis of the right kidney.  Urology was contacted and Dr. Jeffie Pollock reviewed the CT scan and the patient's data.  He recommended infectious disease consultation.  She was started on empiric antibiotics.  Her urine was positive for Klebsiella.  She was transitioned to oral Levaquin on 1219. On admission, CT scan of the head was also performed which was negative.  Due to worsening speech, MRI was performed on 12/21 which revealed an acute moderate sized left AICA cerebellar infarct.  Underlying extensive chronic left MCA territory encephalomalacia related to the 2021 infarct was noted.   Associated left brainstem wallerian degeneration since last year was noted.  Small superimposed chronic left PICA cerebellar infarcts were new since last year.  She developed elevated heart rate, decreased responsiveness and foaming at her mouth on 12/22 and rapid response was called.  Neurology evaluated and Keppra load of 1500 mg IV was given and she was started on Keppra 500 mg twice a day.  Aspirin 81 mg was added.  2D echo was completed and was negative.  Ejection fraction was estimated at 50 to 55%.   Patient was admitted to physical medicine and rehab inpatient services on September 24, 2021.  On the evening of 12/24, she developed mild tachycardia and fever to 102.5.  Lab work was completed at her repeat white blood cell count revealed elevation to 26,000.  His antibiotics were broadened to include vancomycin and Zosyn.  IV fluids are started for volume support.  On 12/25, repeat CT of the abdomen pelvis were performed.  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  EKG revealed a heart rate of 139 with rapid ventricular response..  Lopressor was given intravenously.  These findings and the case were discussed with internal medicine and the patient was transferred from inpatient rehab to acute care internal medicine service.  She was started on diltiazem infusion. Eliquis was discontinued and she was started on heparin infusion.  As she was maintained on Eliquis throughout her admission to CIR, concern for antiphospholipid antibody syndrome was considered.  Work-up with cardiolipin and beta-2 glycoprotein antibodies are pending.  Lupus anticoagulant is also pending however this may  not be reliable since she was receiving heparin infusion.  She remains in A. fib on review of telemetry but her rates are overall improved.  She was started on Lovenox 65 mg every 12 hours on 12/27.  Her first dose of  warfarin 2 mg was administered on 12/27 and 1645 hrs.   Infectious disease consultation was obtained by Dr. Carlyle Basques. Antibiotics were narrowed back to cefazolin since no new source of infection was found.  Her white blood cell count improved.  She has been afebrile for approximately 48 hours.   Laboratory work-up revealed acute kidney injury with increase in serum creatinine from 0.79 on 12/23 to 1.61 on 12/24.  Trended upward to 1.74 on 12/26.  Today, her serum creatinine is 1.32.  She had 1800 cc of urine output yesterday.   Patient requires inpatient medicine and rehabilitation evaluations and services for ongoing dysfunction secondary to cerebellar stroke..  Patient transferred to CIR on 10/01/2021 .   Patient currently requires max with mobility secondary to muscle weakness, muscle joint tightness, and muscle paralysis, decreased cardiorespiratoy endurance, abnormal tone, motor apraxia, decreased coordination, and decreased motor planning, decreased attention to right, decreased motor planning, and ideational apraxia, decreased initiation, decreased attention, decreased awareness, decreased problem solving, decreased memory, and delayed processing, and decreased sitting balance, decreased standing balance, decreased postural control, hemiplegia, and decreased balance strategies.  Prior to hospitalization, patient was min with mobility and lived with Daughter in a House home.  Home access is  Level entry.  Patient will benefit from skilled PT intervention to maximize safe functional mobility, minimize fall risk, and decrease caregiver burden for planned discharge home with 24 hour assist.  Anticipate patient will benefit from follow up [facility] at discharge.  PT - End of Session Activity Tolerance: Tolerates 10 - 20 min activity with multiple rests Endurance Deficit: Yes Endurance Deficit Description: Requires frequent rest breaks with minimal activity PT Assessment Rehab Potential (ACUTE/IP  ONLY): Good PT Barriers to Discharge: Other (comments);Decreased caregiver support PT Patient demonstrates impairments in the following area(s): Balance;Behavior;Edema;Endurance;Motor;Nutrition;Pain;Perception;Safety;Sensory;Skin Integrity PT Transfers Functional Problem(s): Bed Mobility;Bed to Chair;Car;Furniture;Floor PT Locomotion Functional Problem(s): Ambulation;Wheelchair Mobility;Stairs PT Plan PT Intensity: Minimum of 1-2 x/day ,45 to 90 minutes PT Frequency: 5 out of 7 days PT Duration Estimated Length of Stay: 10-14 days PT Treatment/Interventions: Ambulation/gait training;Cognitive remediation/compensation;Discharge planning;DME/adaptive equipment instruction;Functional mobility training;Pain management;Psychosocial support;Splinting/orthotics;Therapeutic Activities;UE/LE Strength taining/ROM;Visual/perceptual remediation/compensation;Wheelchair propulsion/positioning;UE/LE Coordination activities;Therapeutic Exercise;Skin care/wound management;Patient/family education;Neuromuscular re-education;Functional electrical stimulation;Disease management/prevention;Community reintegration;Balance/vestibular training PT Transfers Anticipated Outcome(s): min A with LRAD PT Locomotion Anticipated Outcome(s): Min A with with LRAD PT Recommendation Follow Up Recommendations: Home health PT Patient destination: Home Equipment Recommended: To be determined   PT Evaluation Precautions/Restrictions Precautions Precautions: Fall;Other (comment) Precaution Comments: Baseline R-side hemiplegia; global aphasia Required Braces or Orthoses: Other Brace;Splint/Cast Splint/Cast: Baseline RUE resting hand splint Other Brace: Baseline R AFO for ambulation Restrictions Weight Bearing Restrictions: No General   Vital SignsTherapy Vitals Temp: 98.5 F (36.9 C) Temp Source: Oral Pulse Rate: 76 Resp: 17 BP: 138/73 Patient Position (if appropriate): Lying Oxygen Therapy SpO2: 100 % O2 Device: Room  Air Pain Pain Assessment Pain Scale: 0-10 Pain Score: 0-No pain Pain Interference Pain Interference Pain Effect on Sleep: 0. Does not apply - I have not had any pain or hurting in the past 5 days Pain Interference with Therapy Activities: 1. Rarely or not at all Pain Interference with Day-to-Day Activities: 1. Rarely or not at all Home Living/Prior [place] Available Help at Discharge:  Family Type of Home: House Home Access: Level entry Home Layout: Multi-level;Able to live on main level with bedroom/bathroom;1/2 bath on main level Alternate Level Stairs-Number of Steps: flight, chair lift Bathroom Shower/Tub: Walk-in shower Bathroom Toilet: Standard Bathroom Accessibility: Yes Additional Comments: Information taken from chart due to patient presenting with expressive aphasia and no family present at time of evaluation  Lives With: Daughter Prior Function Level of Independence: Requires assistive device for independence;Needs assistance with ADLs;Needs assistance with gait Bath: Supervision/set-up Toileting: Supervision/set-up Dressing: Supervision/set-up Driving: No Vision/Perception  Vision - History Ability to See in Adequate Light: 0 Adequate (unable to fully assess 2/2 global aphasia) Vision - Assessment Eye Alignment: Impaired (comment) Ocular Range of Motion: Impaired-to be further tested in functional context Alignment/Gaze Preference: Gaze left Tracking/Visual Pursuits: Requires cues, head turns, or add eye shifts to track Saccades: Decreased speed of saccadic movement;Additional head turns occurred during testing Convergence: Impaired (comment) Perception Perception: Impaired Inattention/Neglect: Does not attend to right side of body Praxis Praxis: Impaired Praxis Impairment Details: Motor planning;Initiation;Ideomotor  Cognition Overall Cognitive Status: Difficult to assess (2' aphasia) Arousal/Alertness: Awake/alert Orientation Level: Oriented  to person;Oriented to place Year:  (Unable to state 2/2 global aphasia) Month:  (Unable to state 2/2 global aphasia) Day of Week:  (Unable to state 2/2 global aphasia) Attention: Focused;Sustained Focused Attention: Appears intact Sustained Attention: Impaired Sustained Attention Impairment: Verbal basic;Functional basic Memory: Impaired (Difficulty fully assessing 2/2 global aphasia) Immediate Memory Recall: Blue;Bed (perseverating on bed) Memory Recall Sock:  (Unable to state 2/2 global aphasia) Memory Recall Blue:  (Unable to state 2/2 global aphasia) Memory Recall Bed:  (Unable to state 2/2 global aphasia) Awareness: Impaired Problem Solving: Impaired Problem Solving Impairment: Functional basic Safety/Judgment: Impaired Sensation Sensation Light Touch: Impaired Detail Central sensation comments: R hemiperisis, non-responsive to light touch in UE and LE, Light Touch Impaired Details: Absent RUE;Impaired RLE Hot/Cold: Impaired by gross assessment Proprioception: Impaired by gross assessment Stereognosis: Impaired by gross assessment Additional Comments: difficulty fully assessing 2/2 global aphasia, dense R hemi RUE> RLE Coordination Gross Motor Movements are Fluid and Coordinated: No Fine Motor Movements are Fluid and Coordinated: No Coordination and Movement Description: R hemi UE>LE, decreased postural control and motor planning Finger Nose Finger Test: unable to complete on R Heel Shin Test: Unable on R Motor  Motor Motor: Hemiplegia;Abnormal postural alignment and control;Abnormal tone Motor - Skilled Clinical Observations: Right hemiparesis, with greater impairment in the UE compared to LE   Trunk/Postural Assessment  Cervical Assessment Cervical Assessment: Exceptions to [facility] (cervical flexion with head tilt to the left) Thoracic Assessment Thoracic Assessment: Exceptions to [facility] (thoracic rounding with right shoulder elevation) Lumbar Assessment Lumbar Assessment:  Exceptions to [facility] (posterior pelvic tilt) Postural Control Postural Control: Deficits on evaluation (posterior LOB onto bed in sitting)  Balance Balance Balance Assessed: Yes Static Sitting Balance Static Sitting - Balance Support: Left upper extremity supported;Feet supported Static Sitting - Level of Assistance: 5: Stand by assistance Dynamic Sitting Balance Dynamic Sitting - Balance Support: During functional activity Dynamic Sitting - Level of Assistance: 4: Min Insurance risk surveyor Standing - Balance Support: Left upper extremity supported Static Standing - Level of Assistance: 3: Mod assist Dynamic Standing Balance Dynamic Standing - Balance Support: During functional activity;Left upper extremity supported Dynamic Standing - Level of Assistance: 2: Max assist Extremity Assessment  RUE Assessment RUE Assessment: Exceptions to [facility] Passive Range of Motion (PROM) Comments: Limitations in shoulder flexion 0-110 degrees with increased tone.  Elbow flexion/extension, wrist  extension and digit extension also limited secondary increased tone. Active Range of Motion (AROM) Comments: No active movement noted General Strength Comments: increased tone in the pectoral, lats, elbow flexors, digit flexors RUE Body System: Neuro Brunstrum levels for arm and hand: Arm;Hand Brunstrum level for arm: Stage I Presynergy Brunstrum level for hand: Stage I Flaccidity LUE Assessment LUE Assessment: Within Functional Limits Active Range of Motion (AROM) Comments: able to brush teeth/self-feed with LUE General Strength Comments: Strength at least 3+/5 but not formally assessed secondary to cognition RLE Assessment RLE Assessment: Exceptions to [facility] Active Range of Motion (AROM) Comments: DF ROM to limited at neutral General Strength Comments: Grossly 2 to 3/5 throughout, unable to motor plan for formal strength testing LLE Assessment LLE Assessment: Within Functional Limits Active  Range of Motion (AROM) Comments: WFL for all functional mobility General Strength Comments: Grossly 4+ to 5/5 throughout  Care Tool Care Tool Bed Mobility Roll left and right activity   Roll left and right assist level: Maximal Assistance - Patient 25 - 49%    Sit to lying activity   Sit to lying assist level: Maximal Assistance - Patient 25 - 49%    Lying to sitting on side of bed activity   Lying to sitting on side of bed assist level: the ability to move from lying on the back to sitting on the side of the bed with no back support.: Moderate Assistance - Patient 50 - 74%     Care Tool Transfers Sit to stand transfer   Sit to stand assist level: Maximal Assistance - Patient 25 - 49%    Chair/bed transfer   Chair/bed transfer assist level: Maximal Assistance - Patient 25 - 49%     Toilet transfer   Assist Level: 2 Helpers (stedy)    Scientist, product/process development transfer activity did not occur: Safety/medical concerns        Care Tool Locomotion Ambulation Ambulation activity did not occur: Safety/medical concerns        Walk 10 feet activity Walk 10 feet activity did not occur: Safety/medical concerns       Walk 50 feet with 2 turns activity Walk 50 feet with 2 turns activity did not occur: Safety/medical concerns      Walk 150 feet activity Walk 150 feet activity did not occur: Safety/medical concerns      Walk 10 feet on uneven surfaces activity Walk 10 feet on uneven surfaces activity did not occur: Safety/medical concerns      Stairs Stair activity did not occur: Safety/medical concerns        Walk up/down 1 step activity Walk up/down 1 step or curb (drop down) activity did not occur: Safety/medical concerns      Walk up/down 4 steps activity Walk up/down 4 steps activity did not occur: Safety/medical concerns      Walk up/down 12 steps activity Walk up/down 12 steps activity did not occur: Safety/medical concerns      Pick up small objects from floor   Pick up  small object from the floor assist level: Total Assistance - Patient < 25%    Wheelchair Is the patient using a wheelchair?: Yes Type of Wheelchair: Manual   Wheelchair assist level: Minimal Assistance - Patient > 75% Max wheelchair distance: 150  Wheel 50 feet with 2 turns activity   Assist Level: Minimal Assistance - Patient > 75%  Wheel 150 feet activity   Assist Level: Minimal Assistance - Patient > 75%    Refer to  Care Plan for Long Term Goals  SHORT TERM GOAL WEEK 1 PT Short Term Goal 1 (Week 1): Patient will perform bed mobility with min A >50% of the time without use of hospital bed features. PT Short Term Goal 2 (Week 1): Patient will perform basic transfers with min A >50% of the time using LRAD. PT Short Term Goal 3 (Week 1): Patient will ambulate >50 feet with min A using LRAD. PT Short Term Goal 4 (Week 1): Patient will improved Berg Balance Scale by 7 points to meed MCD.  Recommendations for other services: None   Skilled Therapeutic Intervention Mobility Bed Mobility Bed Mobility: Supine to Sit Rolling Right: (P) Moderate Assistance - Patient 50-74% Rolling Left: (P) Maximal Assistance - Patient 25-49% Supine to Sit: Maximal Assistance - Patient - Patient 25-49% Transfers Transfers: Transfer via Geophysicist/field seismologist;Sit to Stand;Stand to Sit Sit to Stand: Dependent - mechanical lift;2 Helpers (min of 2 in stedy) Stand to Sit: Dependent - mechanical lift;2 Helpers (min of 2 in stedy) Transfer via Lift Equipment: Probation officer Ambulation: Yes Gait Assistance: Moderate Assistance - Patient 50-74% Gait Distance (Feet): 10 Feet Assistive device: Parallel bars Gait Gait: Yes Gait Pattern: Impaired Gait Pattern: Step-to pattern;Step-through pattern;Decreased step length - right;Decreased stance time - right;Decreased stride length;Decreased hip/knee flexion - right;Decreased weight shift to right;Decreased weight shift to left;Right foot flat Stairs /  Additional Locomotion Stairs: No Wheelchair Mobility Wheelchair Mobility: Yes Wheelchair Assistance: Minimal assistance - Patient >75% Wheelchair Propulsion: Left lower extremity Wheelchair Parts Management: Needs assistance Distance: 150  Pt received supine in bed and agreeable to PT. Supine>sit transfer with mod assist and cues for use of RUE/RLE. PT instructed patient in PT Evaluation and initiated treatment intervention; see above for results. PT educated patient in [place], rehab potential, rehab goals, and discharge recommendations along with recommendation for follow-up rehabilitation services. Gait training in parallel bars x 42f with mod assist with AFO on the RLE. Assist required to advance the RLE. WC mobility with LLE forward  [id number] then reverse with min assist for steering. Pt returned to room and performed stand pivot transfer to bed with UE support . Sit>supine completed with max assist for trunk and BLE control.  and left supine in bed with call bell in reach and all needs met.        Discharge Criteria: Patient will be discharged from PT if patient refuses treatment 3 consecutive times without medical reason, if treatment goals not met, if there is a change in medical status, if patient makes no progress towards goals or if patient is discharged from hospital.  The above assessment, treatment plan, treatment alternatives and goals were discussed and mutually agreed upon: by patient  AuLorie Phenix2/31/2022, 3:32 PM

## 2021-10-02 NOTE — Plan of Care (Signed)
Problem: RH Balance Goal: LTG Patient will maintain dynamic standing with ADLs (OT) Description: LTG:  Patient will maintain dynamic standing balance with assist during activities of daily living (OT)  Flowsheets (Taken 10/02/2021 1225) LTG: Pt will maintain dynamic standing balance during ADLs with: Minimal Assistance - Patient > 75%   Problem: Sit to Stand Goal: LTG:  Patient will perform sit to stand in prep for activites of daily living with assistance level (OT) Description: LTG:  Patient will perform sit to stand in prep for activites of daily living with assistance level (OT) Flowsheets (Taken 10/02/2021 1225) LTG: PT will perform sit to stand in prep for activites of daily living with assistance level: Contact Guard/Touching assist   Problem: RH Eating Goal: LTG Patient will perform eating w/assist, cues/equip (OT) Description: LTG: Patient will perform eating with assist, with/without cues using equipment (OT) Flowsheets (Taken 10/02/2021 1225) LTG: Pt will perform eating with assistance level of: Set up assist    Problem: RH Grooming Goal: LTG Patient will perform grooming w/assist,cues/equip (OT) Description: LTG: Patient will perform grooming with assist, with/without cues using equipment (OT) Flowsheets (Taken 10/02/2021 1225) LTG: Pt will perform grooming with assistance level of: Set up assist    Problem: RH Bathing Goal: LTG Patient will bathe all body parts with assist levels (OT) Description: LTG: Patient will bathe all body parts with assist levels (OT) Flowsheets (Taken 10/02/2021 1225) LTG: Pt will perform bathing with assistance level/cueing: Minimal Assistance - Patient > 75%   Problem: RH Dressing Goal: LTG Patient will perform upper body dressing (OT) Description: LTG Patient will perform upper body dressing with assist, with/without cues (OT). Flowsheets (Taken 10/02/2021 1225) LTG: Pt will perform upper body dressing with assistance level of: Minimal  Assistance - Patient > 75% Goal: LTG Patient will perform lower body dressing w/assist (OT) Description: LTG: Patient will perform lower body dressing with assist, with/without cues in positioning using equipment (OT) Flowsheets (Taken 10/02/2021 1225) LTG: Pt will perform lower body dressing with assistance level of: Minimal Assistance - Patient > 75%   Problem: RH Toileting Goal: LTG Patient will perform toileting task (3/3 steps) with assistance level (OT) Description: LTG: Patient will perform toileting task (3/3 steps) with assistance level (OT)  Flowsheets (Taken 10/02/2021 1225) LTG: Pt will perform toileting task (3/3 steps) with assistance level: Minimal Assistance - Patient > 75%   Problem: RH Vision Goal: RH LTG Vision Consulting civil engineer) Flowsheets (Taken 10/02/2021 1225) LTG: Vision Goals: Pt will independently incorporate visual scanning strategies to locate all ADL items during morning routine.   Problem: RH Functional Use of Upper Extremity Goal: LTG Patient will use RT/LT upper extremity as a (OT) Description: LTG: Patient will use right/left upper extremity as a stabilizer/gross assist/diminished/nondominant/dominant level with assist, with/without cues during functional activity (OT) Flowsheets (Taken 10/02/2021 1225) LTG: Use of upper extremity in functional activities: LUE as a stabilizer LTG: Pt will use upper extremity in functional activity with assistance level of: Minimal Assistance - Patient > 75%   Problem: RH Toilet Transfers Goal: LTG Patient will perform toilet transfers w/assist (OT) Description: LTG: Patient will perform toilet transfers with assist, with/without cues using equipment (OT) Flowsheets (Taken 10/02/2021 1225) LTG: Pt will perform toilet transfers with assistance level of: Minimal Assistance - Patient > 75%   Problem: RH Tub/Shower Transfers Goal: LTG Patient will perform tub/shower transfers w/assist (OT) Description: LTG: Patient will perform  tub/shower transfers with assist, with/without cues using equipment (OT) Flowsheets (Taken 10/02/2021 1225) LTG: Pt will perform  tub/shower stall transfers with assistance level of: Minimal Assistance - Patient > 75%   Problem: RH Attention Goal: LTG Patient will demonstrate this level of attention during functional activites (OT) Description: LTG:  Patient will demonstrate this level of attention during functional activites  (OT) Flowsheets (Taken 10/02/2021 1225) Patient will demonstrate this level of attention during functional activites: Sustained Patient will demonstrate above attention level in the following environment: Controlled LTG: Patient will demonstrate this level of attention during functional activites (OT): Supervision

## 2021-10-02 NOTE — Progress Notes (Signed)
PROGRESS NOTE   Subjective/Complaints: No complaints voiced this morning She appears depressed Recognizes me and smiles Severe aphasia  ROS: Unable to obtain due to aphasia   Objective:   VAS [place] LOWER EXTREMITY VENOUS (DVT)  Result Date: 10/01/2021  Lower Venous DVT Study Patient Name:  Lindsey [name]  Date of Exam:   10/01/2021 Medical Rec #: [id number]      Accession #:    [id number] Date of Birth: 1956-01-14      Patient Gender: F Patient Age:   65 years Exam Location:  [facility] Procedure:      VAS [place] LOWER EXTREMITY VENOUS (DVT) Referring Phys: EMILY MULLEN --------------------------------------------------------------------------------  Indications: Left leg pain.  Limitations: Suboptimal patient positioning. Comparison       04-22-2020 Bilateral lower extremity venous was negative for Study:           DVT. Performing Technologist: Darlin Coco RDMS, RVT  Examination Guidelines: A complete evaluation includes B-mode imaging, spectral Doppler, color Doppler, and power Doppler as needed of all accessible portions of each vessel. Bilateral testing is considered an integral part of a complete examination. Limited examinations for reoccurring indications may be performed as noted. The reflux portion of the exam is performed with the patient in reverse Trendelenburg.  +---------+---------------+---------+-----------+----------+--------------+  LEFT      Compressibility Phasicity Spontaneity Properties Thrombus Aging  +---------+---------------+---------+-----------+----------+--------------+  CFV       Full            Yes       Yes                                    +---------+---------------+---------+-----------+----------+--------------+  SFJ       Full                                                             +---------+---------------+---------+-----------+----------+--------------+  FV Prox   Full                                                              +---------+---------------+---------+-----------+----------+--------------+  FV Mid    Full                                                             +---------+---------------+---------+-----------+----------+--------------+  FV Distal Full                                                             +---------+---------------+---------+-----------+----------+--------------+  PFV       Full                                                             +---------+---------------+---------+-----------+----------+--------------+  POP       Full            Yes       Yes                                    +---------+---------------+---------+-----------+----------+--------------+  PTV       Full                                                             +---------+---------------+---------+-----------+----------+--------------+  PERO      Full                                                             +---------+---------------+---------+-----------+----------+--------------+     Summary: LEFT: - There is no evidence of deep vein thrombosis in the lower extremity.  - No cystic structure found in the popliteal fossa.  *See table(s) above for measurements and observations. Electronically signed by Sherald Hess MD on 10/01/2021 at 4:30:00 PM.    Final    Recent Labs    10/01/21 0045 10/02/21 0518  WBC 7.4 6.4  HGB 10.4* 10.3*  HCT 29.4* 29.1*  PLT 299 315   Recent Labs    10/01/21 0045 10/02/21 0518  NA 130* 133*  K 3.9 3.7  CL 96* 99  CO2 22 22  GLUCOSE 110* 107*  BUN 10 11  CREATININE 1.19* 1.29*  CALCIUM 8.7* 8.7*    Intake/Output Summary (Last 24 hours) at 10/02/2021 1738 Last data filed at 10/01/2021 1832 Gross per 24 hour  Intake 180 ml  Output --  Net 180 ml        Physical Exam: Vital Signs Blood pressure 138/73, pulse 76, temperature 98.5 F (36.9 C), temperature source Oral, resp. rate 17, SpO2 100 %. Gen: no distress, normal  appearing HEENT: oral mucosa pink and moist, NCAT Cardio: Reg rate Chest: normal effort, normal rate of breathing Abd: soft, non-distended Ext: no edema Psych: pleasant, recognized me and smiles, but mood is more depressed than usual Skin: intact    Mental Status: She is alert.     Comments: Pt is alert. Follows simple basic commands. Nods head "yes/no" to simple questions. Exp>rec aphasia. Did not engage much however during exam due to mood. RUE tr/5. RLE 1-2/5 prox to 0-trace distally (poor effort). Moves left arm and leg freely. Decreased pain sense Right arm and leg. Does have resting flexor tone right wrist and elbow.        Assessment/Plan: 1. Functional deficits which require 3+ hours per day of interdisciplinary therapy in a comprehensive inpatient rehab setting. Physiatrist is providing  close team supervision and 24 hour management of active medical problems listed below. Physiatrist and rehab team continue to assess barriers to discharge/monitor patient progress toward functional and medical goals  Care Tool:  Bathing    Body parts bathed by patient: Face, Left upper leg   Body parts bathed by helper: Buttocks, Front perineal area, Left lower leg, Right lower leg, Face, Abdomen, Chest, Right arm, Left arm     Bathing assist Assist Level: Total Assistance - Patient < 25%     Upper Body Dressing/Undressing Upper body dressing   What is the patient wearing?: Hospital gown only, Pull over shirt    Upper body assist Assist Level: Maximal Assistance - Patient 25 - 49%    Lower Body Dressing/Undressing Lower body dressing      What is the patient wearing?: Incontinence brief, Pants     Lower body assist Assist for lower body dressing: 2 Helpers     Toileting Toileting    Toileting assist Assist for toileting: 2 Helpers     Transfers Chair/bed transfer  Transfers assist     Chair/bed transfer assist level: Maximal Assistance - Patient 25 - 49%      Locomotion Ambulation   Ambulation assist   Ambulation activity did not occur: Safety/medical concerns          Walk 10 feet activity   Assist  Walk 10 feet activity did not occur: Safety/medical concerns        Walk 50 feet activity   Assist Walk 50 feet with 2 turns activity did not occur: Safety/medical concerns         Walk 150 feet activity   Assist Walk 150 feet activity did not occur: Safety/medical concerns         Walk 10 feet on uneven surface  activity   Assist Walk 10 feet on uneven surfaces activity did not occur: Safety/medical concerns         Wheelchair     Assist Is the patient using a wheelchair?: Yes Type of Wheelchair: Manual    Wheelchair assist level: Minimal Assistance - Patient > 75% Max wheelchair distance: 150    Wheelchair 50 feet with 2 turns activity    Assist        Assist Level: Minimal Assistance - Patient > 75%   Wheelchair 150 feet activity     Assist      Assist Level: Minimal Assistance - Patient > 75%   Blood pressure 138/73, pulse 76, temperature 98.5 F (36.9 C), temperature source Oral, resp. rate 17, SpO2 100 %.  Medical Problem List and Plan: 1. 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 She'll need some motivation at times to participate  Start B/C vitamin complex  Check Vitamin D level on Monday 2.  Antithrombotics: -DVT/anticoagulation:  INR 3.1 today, continue lovenox coverage with coumadin. If INR >2.0 12/31 can stop lovenox. Appreciate pharmacy help             -antiplatelet therapy: Aspirin 81 mg daily 3. Pain Management: Tylenol 4. Situational depression: LCSW to evaluate and provide emotional support             -antipsychotic agents: N/A             -  daughter supportive. Pt  is unhappy about still being in the hospital. Will need encouragement and positive reinforcement from team. Discuss with daughter whether she may benefit from antidepressant 5. Neuropsych: This patient is not capable of making decisions on her own behalf. 6. Skin/Wound Care: Routine skin care checks 7. Fluids/Electrolytes/Nutrition: Ins and outs and follow-up chemistries. 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  Pending hypercoagulable labs, primary care provider could consider transitioning back to NOAC if the work-up is negative.  Consider hematology referral as outpatient. 9.  Atrial fibrillation with RVR: amlodipine held. Diltiazem infusion weaned. HE improved -- metoprolol 50 mg BID -HR elevated to 150 this morning with PT. Observe for patterns.  -consider resuming amlodipine (home med) bp permitting -magnesium low but will not supplement given AKI 10: Klebsiella positive, multifocal pyelonephritis with abscess: Infectious disease recommend transition from intravenous cefazolin to cefadroxil twice a day on discharge to complete a 4-week treatment course.  Stop date of the 10/20/2021. 11: Possible seizure activity:  --continue Keppra 12: Hypertension: stable --metoprolol 50 mg BID -- Resume home amlodipine as needed 13: AKI: baseline Scr> 0.70. 1.74 on 12/26, 1.32 on 12/28. Monitor trend. 14.  Mild anemia: No indication for transfusion.  Monitor H&H.   LOS: 1 days A FACE TO FACE EVALUATION WAS PERFORMED  Lindsey [name] 10/02/2021, 5:38 PM

## 2021-10-02 NOTE — Evaluation (Signed)
Speech Language Pathology Assessment and Plan  Patient Details  Name: Lindsey [name] MRN: [id number] Date of Birth: [birth year]  SLP Diagnosis: Dysphagia;Apraxia;Aphasia;Cognitive Impairments  Rehab Potential: Good ELOS: 18-21 days   Today's Date: 10/02/2021 SLP Individual Time: 1300-1400 SLP Individual Time Calculation (min): 60 min  Hospital Problem: Principal Problem:   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 [facility])  Past Medical History:  Past Medical History:  Diagnosis Date   Diverticulosis    Hypertension    Uterine prolapse    Past Surgical History: History reviewed. No pertinent surgical history.  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  She presented to the emergency department on September 19, 2021 with new onset of nausea, vomiting diaphoresis as well as change in baseline mental status as reported by her family.  She had no report of loss of consciousness or seizure type activity.  No known fever or chills.  As part of her work-up, she underwent CT scan of the abdomen pelvis which revealed multifocal pyelonephritis of the right kidney.  Urology was contacted and Dr. Jeffie Pollock reviewed the CT scan and the patient's data.  He recommended infectious disease consultation.  She was started on empiric antibiotics.  Her urine was positive for Klebsiella.  She was transitioned to oral Levaquin on 1219.  On admission, CT scan of the head was also performed which was negative.  Due to worsening speech, MRI was performed on 12/21 which revealed an acute moderate sized left AICA cerebellar infarct.  Underlying extensive chronic left MCA territory encephalomalacia related to the 2021 infarct was noted.  Associated left brainstem wallerian degeneration since last year was noted.  Small superimposed  chronic left PICA cerebellar infarcts were new since last year.  She developed elevated heart rate, decreased responsiveness and foaming at her mouth on 12/22 and rapid response was called.  Neurology evaluated and Keppra load of 1500 mg IV was given and she was started on Keppra 500 mg twice a day.  Aspirin 81 mg was added.  2D echo was completed and was negative.  Ejection fraction was estimated at 50 to 55%.   Patient was admitted to physical medicine and rehab inpatient services on September 24, 2021.  On the evening of 12/24, she developed mild tachycardia and fever to 102.5.  On 12/25, repeat CT of the abdomen pelvis were performed.  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  EKG revealed a heart rate of 139 with rapid ventricular response.  Patient requires inpatient medicine and rehabilitation evaluations and services for ongoing dysfunction secondary to cerebellar stroke. Patient transferred to CIR on 10/01/2021.   Pt participating MAST with an expressive language score of 18/40 (adjusted to omit writing section) and receptive language score of 22/50. Pt has a history of aphasia and apraxia 2' previous CVA, was utilizing AAC device at home following extensive OPST treatment. Pt was admitted to CIR and evaluated by ST 12/24 before transfer to acute. Pt with current deficits in complex comprehension, basic verbal expression, and reading comprehension. Pt with strengths in repetition and automatic speech, inconsistent responses to simple and abstract yes/no questions. Pt able to initially name 2/5 objects, when becoming more alert during testing SLP having pt attempt naming portion again which she was able  to name 3/5 objects. Pt able to follow simple 1-step directions with 80% accuracy, unable to follow any 2-step directions. Daughter reports pt language is significantly different than it was  before most recent CVA. Pt continues to demonstrate motor planning deficits impacting initiation of language tasks. Pt unable to complete cognitive assessment at this time due to aphasia but attention to task is noted to be impaired.  As noted on previous ST BSE, oral-motor deficits are still prevalent which are pt's baseline. Pt currently on Dys 3 diet with thin liquids, observed with Dys 3 and regular solid trials. Pt takes large bites when note provided cues which results in prolonged mastication and needs for liquid assistance. Pt with mild stasis in R buccal area which she was able to clear with lingual sweep and liquid wash. Recommend pt continue Dys 3/thin diet at this time with full supervision, RN made aware of supervision changes. Pt will benefit from skilled ST to increase independence with functional communication during CIR stay and increase swallow function prior to discharge.    Skilled Therapeutic Interventions          Pt participating in Bedside Swallow Evaluation, MS Aphasia Screening Test as well as further non-standardized assessments of speech and language. Please see above.   SLP Assessment  Patient will need skilled Speech Lanaguage Pathology Services during CIR admission    Recommendations  SLP Diet Recommendations: Dysphagia 3 (Mech soft);Thin Liquid Administration via: Cup;Straw Medication Administration: Whole meds with puree Supervision: Patient able to self feed;Full supervision/cueing for compensatory strategies Compensations: Minimize environmental distractions;Slow rate;Small sips/bites;Lingual sweep for clearance of pocketing;Follow solids with liquid;Monitor for anterior loss Postural Changes and/or Swallow Maneuvers: Seated upright 90 degrees Oral Care Recommendations: Oral care BID Patient destination: Home Follow up Recommendations: Outpatient SLP;24 hour supervision/assistance;Home Health SLP Equipment Recommended: None recommended by SLP    SLP Frequency 3  to 5 out of 7 days   SLP Duration  SLP Intensity  SLP Treatment/Interventions 18-21 days  Minumum of 1-2 x/day, 30 to 90 minutes  Cognitive remediation/compensation;Dysphagia/aspiration precaution training;Internal/external aids;Speech/Language facilitation;Cueing hierarchy;Environmental controls;Therapeutic Activities;Functional tasks;Patient/family education    Pain Pain Assessment Pain Scale: 0-10 Pain Score: 0-No pain  Prior Functioning Cognitive/Linguistic Baseline: Baseline deficits Baseline deficit details: aphasia, apraxia Type of Home: House  Lives With: Daughter Available Help at Discharge: Family  SLP Evaluation Cognition Overall Cognitive Status: Difficult to assess (2' aphasia) Arousal/Alertness: Awake/alert Orientation Level: Oriented to person;Oriented to place Year:  (Unable to state 2/2 global aphasia) Month:  (Unable to state 2/2 global aphasia) Day of Week:  (Unable to state 2/2 global aphasia) Attention: Focused;Sustained Focused Attention: Appears intact Sustained Attention: Impaired Sustained Attention Impairment: Verbal basic;Functional basic Memory: Impaired (Difficulty fully assessing 2/2 global aphasia) Immediate Memory Recall: Blue;Bed (perseverating on bed) Memory Recall Sock:  (Unable to state 2/2 global aphasia) Memory Recall Blue:  (Unable to state 2/2 global aphasia) Memory Recall Bed:  (Unable to state 2/2 global aphasia) Awareness: Impaired Problem Solving: Impaired Problem Solving Impairment: Functional basic Safety/Judgment: Impaired  Comprehension Auditory Comprehension Overall Auditory Comprehension: Impaired Yes/No Questions: Impaired Basic Biographical Questions: 51-75% accurate Basic Immediate Environment Questions: 50-74% accurate Complex Questions: 0-24% accurate Commands: Impaired One Step Basic Commands: 25-49% accurate Conversation: Simple Interfering Components: Motor planning;Processing  speed;Attention EffectiveTechniques: Repetition;Extra processing time Visual Recognition/Discrimination Discrimination: Within Function Limits Reading Comprehension Reading Status: Impaired Expression Expression Primary Mode of Expression: Verbal Verbal Expression Overall Verbal Expression: Impaired Initiation: Impaired Automatic Speech: Social Response;Name Level of Generative/Spontaneous Verbalization: Word Repetition:  Impaired Level of Impairment: Word level Naming: Impairment Responsive: 26-50% accurate Confrontation: Impaired Verbal Errors: Neologisms;Perseveration;Not aware of errors Pragmatics: Impairment Impairments: Abnormal affect Written Expression Dominant Hand: Right Written Expression: Exceptions to [facility] Oral Motor Oral Motor/Sensory Function Overall Oral Motor/Sensory Function: Mild impairment Facial ROM: Within Functional Limits Facial Symmetry: Abnormal symmetry right;Suspected CN VII (facial) dysfunction Facial Strength: Reduced right Lingual ROM: Within Functional Limits Lingual Symmetry: Abnormal symmetry right Lingual Strength: Reduced Motor Speech Overall Motor Speech: Impaired Respiration: Within functional limits Phonation: Normal Resonance: Within functional limits Articulation: Impaired Intelligibility: Intelligibility reduced Motor Planning: Impaired Level of Impairment: Word Motor Speech Errors: Groping for words;Unaware;Consistent Effective Techniques: Increased vocal intensity  Care Tool Care Tool Cognition Ability to hear (with hearing aid or hearing appliances if normally used Ability to hear (with hearing aid or hearing appliances if normally used): 0. Adequate - no difficulty in normal conservation, social interaction, listening to TV   Expression of Ideas and Wants Expression of Ideas and Wants: 1. Rarely/Never expressess or very difficult - rarely/never expresses self or speech is very difficult to understand   Understanding  Verbal and Non-Verbal Content Understanding Verbal and Non-Verbal Content: 2. Sometimes understands - understands only basic conversations or simple, direct phrases. Frequently requires cues to understand  Memory/Recall Ability Memory/Recall Ability : None of the above were recalled   Intelligibility: Intelligibility reduced  Bedside Swallowing Assessment General Date of Onset: 09/19/21 Previous Swallow Assessment: see HPI Diet Prior to this Study: Dysphagia 3 (soft);Thin liquids Temperature Spikes Noted: No Respiratory Status: Room air History of Recent Intubation: No Behavior/Cognition: Alert;Requires cueing Oral Cavity - Dentition: Missing dentition Vision: Functional for self-feeding Patient Positioning: Upright in bed Baseline Vocal Quality: Low vocal intensity Volitional Cough: Strong Volitional Swallow: Unable to elicit  Oral Care Assessment Does patient have any of the following "high(er) risk" factors?: None of the above Does patient have any of the following "at risk" factors?: Other - dysphagia Patient is AT RISK: Order set for Adult Oral Care Protocol initiated -  "At Risk Patients" option selected (see row information) Patient is LOW RISK: Follow universal precautions (see row information) Ice Chips Ice chips: Not tested Thin Liquid Thin Liquid: Impaired Presentation: Cup;Self Fed;Straw Oral Phase Functional Implications: Right anterior spillage Nectar Thick Nectar Thick Liquid: Not tested Honey Thick Honey Thick Liquid: Not tested Puree Puree: Impaired Presentation: Self Fed;Spoon Oral Phase Functional Implications: Right anterior spillage Solid Solid: Impaired Presentation: Self Fed;Spoon Oral Phase Impairments: Impaired mastication Oral Phase Functional Implications: Right lateral sulci pocketing;Right anterior spillage BSE Assessment Risk for Aspiration Impact on safety and function: Mild aspiration risk Other Related Risk Factors: History of  dysphagia;Previous CVA  Short Term Goals: Week 1: SLP Short Term Goal 1 (Week 1): Patient will consume current diet with minimal overt s/s of aspiration with Min verbal cues for use of swallowing compensatory strategies. SLP Short Term Goal 2 (Week 1): Patient will demonstrate selective attention to a task in a mildly distracting enviornment for 20 minutes with Min verbal cues for redirection. SLP Short Term Goal 3 (Week 1): Patient will utilize multimodal communication to express wants/needs with Mod A multimodal cues. SLP Short Term Goal 4 (Week 1): Patient will answer mildly abstract yes/no questions with 75% accuracy and Mod verbal cues. SLP Short Term Goal 5 (Week 1): Patient will produce 3 words that are personally relevant/significant with 50% accuracy and Mod A multimodal cues.  Refer to Care Plan for Long Term Goals  Recommendations for other services: None  Discharge Criteria: Patient will be discharged from SLP if patient refuses treatment 3 consecutive times without medical reason, if treatment goals not met, if there is a change in medical status, if patient makes no progress towards goals or if patient is discharged from hospital.  The above assessment, treatment plan, treatment alternatives and goals were discussed and mutually agreed upon: by patient and by family  Dewaine Conger 10/02/2021, 2:00 PM

## 2021-10-02 NOTE — Progress Notes (Signed)
ANTICOAGULATION CONSULT NOTE - Follow Up Consult  Pharmacy Consult for enoxaparin + warfarin Indication:  Renal artery thrombosis , hx afib, possible antiphospholipid antibody syndrome  No Known Allergies  Patient Measurements: Heparin Dosing Weight: 66 kg   Vital Signs: Temp: 98 F (36.7 C) (12/31 0456) BP: 156/91 (12/31 0456) Pulse Rate: 75 (12/31 0456)  Labs: Recent Labs    09/30/21 0540 09/30/21 1206 10/01/21 0045 10/02/21 0518 10/02/21 0936  HGB 10.6*  --  10.4* 10.3*  --   HCT 29.7*  --  29.4* 29.1*  --   PLT 291  --  299 315  --   LABPROT 35.1* 40.7* 31.7*  --  31.9*  INR 3.5* 4.2* 3.1*  --  3.1*  CREATININE  --   --  1.19* 1.29*  --     Estimated Creatinine Clearance: 40.9 mL/min (A) (by C-G formula based on SCr of 1.29 mg/dL (H)).   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 Pt initially on heparin infusion, now to transition to warfarin with enoxaparin bridge given new clot while on apixaban.  Patient also undergoing evaluation for possible antiphospholipid antibody syndrome.   INR today continues at 3.1 and slightly above therapeutic goal. Will continue with low dose warfarin today. Will plan to discontinue Lovenox after today given elevated INR and end of 5-day bridge.   Goal of Therapy:  INR 2-3 Anti-Xa level 0.6-1 units/ml 4hrs after LMWH dose given Monitor platelets by anticoagulation protocol: Yes   Plan:  Discontinue Enoxaparin 1 mg/kg SQ BID after this evening's dose Warfarin 1 mg PO x 1 dose Daily INR Monitor for signs/symptoms of bleeding  Thank you for involving pharmacy in this patient's care.  Arnette Felts, PharmD PGY1 Ambulatory Care Pharmacy Resident 10/02/2021 12:01 PM  **Pharmacist phone directory can be found on [email or web address]  listed under [facility]**

## 2021-10-02 NOTE — Plan of Care (Signed)
°  Problem: RH Comprehension Communication Goal: LTG Patient will comprehend basic/complex auditory (SLP) Description: LTG: Patient will comprehend basic/complex auditory information with cues (SLP). Flowsheets (Taken 10/02/2021 1511) LTG: Patient will comprehend: Basic auditory information LTG: Patient will comprehend auditory information with cueing (SLP): Moderate Assistance - Patient 50 - 74%   Problem: RH Expression Communication Goal: LTG Patient will express needs/wants via multi-modal(SLP) Description: LTG:  Patient will express needs/wants via multi-modal communication (gestures/written, etc) with cues (SLP) Flowsheets (Taken 10/02/2021 1511) LTG: Patient will express needs/wants via multimodal communication (gestures/written, etc) with cueing (SLP): Moderate Assistance - Patient 50 - 74%   Problem: RH Attention Goal: LTG Patient will demonstrate this level of attention during functional activites (SLP) Description: LTG:  Patient will will demonstrate this level of attention during functional activites (SLP) Flowsheets (Taken 10/02/2021 1511) Patient will demonstrate during cognitive/linguistic activities the attention type of: Sustained Patient will demonstrate this level of attention during cognitive/linguistic activities in: Home LTG: Patient will demonstrate this level of attention during cognitive/linguistic activities with assistance of (SLP): Minimal Assistance - Patient > 75%   Problem: RH Swallowing Goal: LTG Patient will consume least restrictive diet using compensatory strategies with assistance (SLP) Description: LTG:  Patient will consume least restrictive diet using compensatory strategies with assistance (SLP) Flowsheets (Taken 10/02/2021 1513) LTG: Pt Patient will consume least restrictive diet using compensatory strategies with assistance of (SLP): Supervision   Problem: RH Expression Communication Goal: LTG Patient will verbally express basic/complex  needs(SLP) Description: LTG:  Patient will verbally express basic/complex needs, wants or ideas with cues  (SLP) Flowsheets (Taken 10/02/2021 1513) LTG: Patient will verbally express basic/complex needs, wants or ideas (SLP): Moderate Assistance - Patient 50 - 74%

## 2021-10-02 NOTE — Evaluation (Signed)
Occupational Therapy Assessment and Plan  Patient Details  Name: Lindsey [name] MRN: [id number] Date of Birth: 09/23/1956  OT Diagnosis: abnormal posture, apraxia, cognitive deficits, disturbance of vision, flaccid hemiplegia and hemiparesis, hemiplegia affecting dominant side, muscle weakness (generalized), and decreased postural control, activity tolerance, static/dynamic sitting/standing balance Rehab Potential: Rehab Potential (ACUTE ONLY): Fair ELOS: 18-21 days   Today's Date: 10/02/2021 OT Individual Time: 6015-6153 OT Individual Time Calculation (min): 69 min     Hospital Problem: Principal Problem:   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 No pertinent surgical history.  Assessment & Plan Clinical Impression: Patient is a 65 y.o. 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  She presented to the emergency department on September 19, 2021 with new onset of nausea, vomiting diaphoresis as well as change in baseline mental status as reported by her family.  She had no report of loss of consciousness or seizure type activity.  No known fever or chills.  As part of her work-up, she underwent CT scan of the abdomen pelvis which revealed multifocal pyelonephritis of the right kidney.  Urology was contacted and Dr. Jeffie Pollock reviewed the CT scan and the patient's data.  He recommended infectious disease consultation.  She was started on empiric antibiotics.  Her urine was positive for Klebsiella.  She was transitioned to oral Levaquin on 1219. On admission, CT scan of the head was also performed which was negative.  Due to worsening speech, MRI was performed on 12/21 which revealed an acute moderate sized left  AICA cerebellar infarct.  Underlying extensive chronic left MCA territory encephalomalacia related to the 2021 infarct was noted.  Associated left brainstem wallerian degeneration since last year was noted.  Small superimposed chronic left PICA cerebellar infarcts were new since last year.  She developed elevated heart rate, decreased responsiveness and foaming at her mouth on 12/22 and rapid response was called.  Neurology evaluated and Keppra load of 1500 mg IV was given and she was started on Keppra 500 mg twice a day.  Aspirin 81 mg was added.  2D echo was completed and was negative.  Ejection fraction was estimated at 50 to 55%.   Patient was admitted to physical medicine and rehab inpatient services on September 24, 2021.  On the evening of 12/24, she developed mild tachycardia and fever to 102.5.  Lab work was completed at her repeat white blood cell count revealed elevation to 26,000.  His antibiotics were broadened to include vancomycin and Zosyn.  IV fluids are started for volume support.  On 12/25, repeat CT of the abdomen pelvis were performed.  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  EKG revealed a heart rate of 139 with rapid ventricular response..  Lopressor was given intravenously.  These findings and the case were discussed with internal medicine and the patient was transferred from inpatient rehab to acute care internal medicine service.  She was started on diltiazem infusion. Eliquis was discontinued and she was started on heparin infusion.  As she was maintained on Eliquis throughout her admission to CIR, concern for antiphospholipid antibody syndrome was considered.  Work-up with  cardiolipin and beta-2 glycoprotein antibodies are pending.  Lupus anticoagulant is also pending however this may not be reliable since she was receiving heparin infusion.  She remains in A. fib on  review of telemetry but her rates are overall improved.  She was started on Lovenox 65 mg every 12 hours on 12/27.  Her first dose of warfarin 2 mg was administered on 12/27 and 1645 hrs.   Infectious disease consultation was obtained by Dr. Carlyle Basques. Antibiotics were narrowed back to cefazolin since no new source of infection was found.  Her white blood cell count improved.  She has been afebrile for approximately 48 hours.   Laboratory work-up revealed acute kidney injury with increase in serum creatinine from 0.79 on 12/23 to 1.61 on 12/24.  Trended upward to 1.74 on 12/26.  Today, her serum creatinine is 1.32.  She had 1800 cc of urine output yesterday.   Patient requires inpatient medicine and rehabilitation evaluations and services for ongoing dysfunction secondary to cerebellar stroke.  She lives at home with her daughter who provides care.  Patient transferred to CIR on 10/01/2021 .    Patient currently requires max with basic self-care skills secondary to muscle weakness, decreased cardiorespiratoy endurance, impaired timing and sequencing, abnormal tone, unbalanced muscle activation, motor apraxia, decreased coordination, and decreased motor planning, decreased visual motor skills, decreased attention to right and decreased motor planning, decreased initiation, decreased attention, decreased awareness, decreased problem solving, decreased safety awareness, decreased memory, delayed processing, and apraxia, and decreased sitting balance, decreased standing balance, decreased postural control, hemiplegia, and decreased balance strategies.  Prior to hospitalization per chart review, patient could complete BADL with supervision.  Patient will benefit from skilled intervention to decrease level of assist with basic self-care skills and increase independence with basic self-care skills prior to discharge home with care partner.  Anticipate patient will require 24 hour supervision and follow up  home health.  OT - End of Session Activity Tolerance: Tolerates 30+ min activity with multiple rests Endurance Deficit: Yes Endurance Deficit Description: Requires frequent rest breaks with minimal activity OT Assessment Rehab Potential (ACUTE ONLY): Fair OT Patient demonstrates impairments in the following area(s): Balance;Cognition;Endurance;Motor;Perception;Safety;Vision;Skin Integrity;Sensory OT Basic ADL's Functional Problem(s): Eating;Grooming;Bathing;Dressing;Toileting OT Transfers Functional Problem(s): Toilet;Tub/Shower OT Additional Impairment(s): Fuctional Use of Upper Extremity OT Plan OT Intensity: Minimum of 1-2 x/day, 45 to 90 minutes OT Frequency: 5 out of 7 days OT Duration/Estimated Length of Stay: 18-21 days OT Treatment/Interventions: Balance/vestibular training;Functional electrical stimulation;Pain management;Self Care/advanced ADL retraining;Therapeutic Activities;UE/LE Coordination activities;DME/adaptive equipment instruction;UE/LE Strength taining/ROM;Neuromuscular re-education;Wheelchair propulsion/positioning;Cognitive remediation/compensation;Discharge planning;Disease mangement/prevention;Patient/family education;Functional mobility training;Therapeutic Exercise;Visual/perceptual remediation/compensation;Community reintegration;Psychosocial support;Splinting/orthotics;Skin care/wound managment OT Self Feeding Anticipated Outcome(s): S OT Basic Self-Care Anticipated Outcome(s): min A OT Toileting Anticipated Outcome(s): min A OT Bathroom Transfers Anticipated Outcome(s): min A OT Recommendation Patient destination: Home Follow Up Recommendations: Outpatient OT;24 hour supervision/assistance;Home health OT Equipment Recommended: To be determined   OT Evaluation Precautions/Restrictions  Precautions Precautions: Fall;Other (comment) Precaution Comments: Baseline R-side hemiplegia; global aphasia Required Braces or Orthoses: Other  Brace;Splint/Cast Splint/Cast: Baseline RUE resting hand splint Other Brace: Baseline R AFO for ambulation Restrictions Weight Bearing Restrictions: No  Pain Pain Assessment Pain Scale: Faces Pain Score: 0-No pain Home Living/Prior Functioning Home Living Available Help at Discharge: Family Type of Home: House Home Access: Level entry Home Layout: Multi-level, Able to live on main level with bedroom/bathroom, 1/2 bath on main level Alternate Level Stairs-Number of Steps: flight, chair lift Bathroom Shower/Tub: Multimedia programmer: Standard Bathroom Accessibility: Yes  Additional Comments: Information taken from chart due to patient presenting with expressive aphasia and no family present at time of evaluation  Lives With: Daughter IADL History Homemaking Responsibilities: No Current License: No Prior Function Level of Independence: Requires assistive device for independence, Needs assistance with ADLs, Needs assistance with gait Bath: Supervision/set-up Toileting: Supervision/set-up Dressing: Supervision/set-up Driving: No Vision Baseline Vision/History: 1 Wears glasses Ability to See in Adequate Light: 0 Adequate (unable to fully assess 2/2 global aphasia) Patient Visual Report: No change from baseline Vision Assessment?: Yes Eye Alignment: Impaired (comment) Ocular Range of Motion: Impaired-to be further tested in functional context Alignment/Gaze Preference: Gaze left Tracking/Visual Pursuits: Requires cues, head turns, or add eye shifts to track Saccades: Decreased speed of saccadic movement;Additional head turns occurred during testing Convergence: Impaired (comment) Visual Fields: No apparent deficits Perception  Perception: Impaired Inattention/Neglect: Does not attend to right side of body Praxis Praxis: Impaired Praxis Impairment Details: Motor planning;Initiation;Ideomotor Cognition Overall Cognitive Status: Difficult to assess (unable to fully  assess 2/2 global aphasia) Arousal/Alertness: Awake/alert Orientation Level: Nonverbal/unable to assess (unable to fully assess 2/2 global aphasia) Year:  (Unable to state 2/2 global aphasia) Month:  (Unable to state 2/2 global aphasia) Day of Week:  (Unable to state 2/2 global aphasia) Memory: Impaired (Difficulty fully assessing 2/2 global aphasia) Immediate Memory Recall: Blue;Bed (perseverating on bed) Memory Recall Sock:  (Unable to state 2/2 global aphasia) Memory Recall Blue:  (Unable to state 2/2 global aphasia) Memory Recall Bed:  (Unable to state 2/2 global aphasia) Attention: Sustained Sustained Attention: Impaired Sustained Attention Impairment: Verbal basic;Functional basic Awareness: Impaired Problem Solving: Impaired Problem Solving Impairment: Functional basic Safety/Judgment: Impaired Sensation Sensation Light Touch: Impaired Detail Central sensation comments: R hemiperisis, non-responsive to light touch in UE and LE, Light Touch Impaired Details: Absent RUE;Impaired RLE Hot/Cold: Impaired by gross assessment Proprioception: Impaired by gross assessment Stereognosis: Impaired by gross assessment Additional Comments: difficulty fully assessing 2/2 global aphasia, dense R hemi RUE> RLE Coordination Gross Motor Movements are Fluid and Coordinated: No Fine Motor Movements are Fluid and Coordinated: No Coordination and Movement Description: R hemi UE>LE, decreased postural control and motor planning Finger Nose Finger Test: unable to complete on R Heel Shin Test: Unable on R Motor  Motor Motor: Hemiplegia;Abnormal postural alignment and control;Abnormal tone Motor - Skilled Clinical Observations: Right hemiparesis, with greater impairment in the UE compared to LE  Trunk/Postural Assessment  Cervical Assessment Cervical Assessment: Exceptions to [facility] (cervical flexion with head tilt to the left) Thoracic Assessment Thoracic Assessment: Exceptions to [facility] (thoracic  rounding with right shoulder elevation) Lumbar Assessment Lumbar Assessment: Exceptions to [facility] (posterior pelvic tilt) Postural Control Postural Control: Deficits on evaluation (posterior LOB onto bed in sitting)  Balance Balance Balance Assessed: Yes Static Sitting Balance Static Sitting - Balance Support: Left upper extremity supported;Feet supported Static Sitting - Level of Assistance: 4: Min assist Static Standing Balance Static Standing - Balance Support: Left upper extremity supported (in stedy) Static Standing - Level of Assistance: 4: Min assist;1: +2 Total assist (min of 2 in stedy) Extremity/Trunk Assessment RUE Assessment RUE Assessment: Exceptions to [facility] Passive Range of Motion (PROM) Comments: Limitations in shoulder flexion 0-110 degrees with increased tone.  Elbow flexion/extension, wrist extension and digit extension also limited secondary increased tone. Active Range of Motion (AROM) Comments: No active movement noted General Strength Comments: increased tone in the pectoral, lats, elbow flexors, digit flexors RUE Body System: Neuro Brunstrum levels for arm and hand: Arm;Hand Brunstrum level for arm: Stage  I Presynergy Brunstrum level for hand: Stage I Flaccidity LUE Assessment LUE Assessment: Within Functional Limits Active Range of Motion (AROM) Comments: able to brush teeth/self-feed with LUE General Strength Comments: Strength at least 3+/5 but not formally assessed secondary to cognition  Care Tool Care Tool Self Care Eating   Eating Assist Level: Minimal Assistance - Patient > 75%    Oral Care    Oral Care Assist Level: Moderate Assistance - Patient 50 - 74%    Bathing   Body parts bathed by patient: Face;Left upper leg Body parts bathed by helper: Buttocks;Front perineal area;Left lower leg;Right lower leg;Face;Abdomen;Chest;Right arm;Left arm   Assist Level: Total Assistance - Patient < 25%    Upper Body Dressing(including orthotics)   What is the  patient wearing?: Hospital gown only;Pull over shirt   Assist Level: Maximal Assistance - Patient 25 - 49%    Lower Body Dressing (excluding footwear)   What is the patient wearing?: Incontinence brief;Pants Assist for lower body dressing: 2 Helpers    Putting on/Taking off footwear   What is the patient wearing?: Non-skid slipper socks Assist for footwear: Total Assistance - Patient < 25%       Care Tool Toileting Toileting activity   Assist for toileting: 2 Helpers     Care Tool Bed Mobility Roll left and right activity        Sit to lying activity        Lying to sitting on side of bed activity   Lying to sitting on side of bed assist level: the ability to move from lying on the back to sitting on the side of the bed with no back support.: Maximal Assistance - Patient 25 - 49%     Care Tool Transfers Sit to stand transfer   Sit to stand assist level: 2 Helpers    Chair/bed transfer   Chair/bed transfer assist level: 2 Helpers (stedy)     Toilet transfer   Assist Level: 2 Helpers (stedy)     Care Tool Cognition  Expression of Ideas and Wants Expression of Ideas and Wants: 1. Rarely/Never expressess or very difficult - rarely/never expresses self or speech is very difficult to understand  Understanding Verbal and Non-Verbal Content Understanding Verbal and Non-Verbal Content: 2. Sometimes understands - understands only basic conversations or simple, direct phrases. Frequently requires cues to understand   Memory/Recall Ability Memory/Recall Ability : None of the above were recalled   Refer to Care Plan for Long Term Goals  SHORT TERM GOAL WEEK 1 OT Short Term Goal 1 (Week 1): Pt will complete sit to stand with no lift and max A in prep for standing ADL. OT Short Term Goal 2 (Week 1): Pt will complete grooming task at sink with no more than min VCs and S. OT Short Term Goal 3 (Week 1): Pt will don shirt with max A. OT Short Term Goal 4 (Week 1): Pt will bathe UB  with mod A.  Recommendations for other services: None    Skilled Therapeutic Intervention ADL ADL Eating: Minimal assistance Where Assessed-Eating: Wheelchair Grooming: Moderate assistance Where Assessed-Grooming: Sitting at sink Upper Body Bathing: Maximal assistance Where Assessed-Upper Body Bathing: Edge of bed Lower Body Bathing: Dependent Where Assessed-Lower Body Bathing: Edge of bed Upper Body Dressing: Maximal assistance Where Assessed-Upper Body Dressing: Edge of bed Lower Body Dressing: Dependent Where Assessed-Lower Body Dressing: Edge of bed Toileting: Dependent Where Assessed-Toileting: Bed level Toilet Transfer: Dependent Toilet Transfer Method: Other (comment) (stedy) Armed forces technical officer  Equipment: Radiographer, therapeutic: Not assessed Social research officer, government: Not assessed Mobility  Bed Mobility Bed Mobility: Supine to Sit Rolling Right: (P) Moderate Assistance - Patient 50-74% Rolling Left: (P) Maximal Assistance - Patient 25-49% Supine to Sit: Maximal Assistance - Patient - Patient 25-49% Transfers Sit to Stand: Dependent - mechanical lift;2 Helpers (min of 2 in stedy) Stand to Sit: Dependent - mechanical lift;2 Helpers (min of 2 in stedy) Session Note: Pt received semi-reclined in bed, opening eyes to therapist entry, no s/sx pain throughout and appears agreeable to OT eval. Reviewed role of CIR OT, evaluation process, ADL/func mobility retraining, goals for therapy, and safety plan. Evaluation completed as documented above. Came to sitting EOB with max A, pt able to follow commands to place LUE on bed rail to pull up of bed. Maintained static sitting balance EOB with consistent min A due to slow posterior LOB. Pt noted with poor initiation with ADL tasks. Doffed hospital gown with max A to untie, pt able to remove LUE. Washed face and L upper LE. But no initiation to bathe remaining body parts. Total A to don shirt, pt able to pull down over L trunk and  assist pulling over head to assist. Attempted sit to stand x3 but pt with no initiation, min A of 2 in stedy to power up and +2 present to change out saturated brief/pants. Stedy transfer of 1 > w/c. Seated at sink completed oral care with mod A for thoroughness and max cuing to clean out remaining toothpaste in mouth due to apraxic deficits. Pt able to self feed with overall min A to initiate using utensils. RUE noted with  increased tone at the elbow, shoulder, wrist, and digit flexors, pt intermittent adjusting positioning throughout, but no initiation to incorporate functionally. HR 55 to 82 bpm throughout session.  Pt left seated in w/c with safety belt alarm engaged, call bell in reach, and all immediate needs met.   Discharge Criteria: Patient will be discharged from OT if patient refuses treatment 3 consecutive times without medical reason, if treatment goals not met, if there is a change in medical status, if patient makes no progress towards goals or if patient is discharged from hospital.  The above assessment, treatment plan, treatment alternatives and goals were discussed and mutually agreed upon: No family available/patient unable  Volanda Napoleon MS, OTR/L  10/02/2021, 12:40 PM

## 2021-10-02 NOTE — Plan of Care (Signed)
°  Problem: RH Balance Goal: LTG Patient will maintain dynamic sitting balance (PT) Description: LTG:  Patient will maintain dynamic sitting balance with assistance during mobility activities (PT) Flowsheets (Taken 10/02/2021 1743) LTG: Pt will maintain dynamic sitting balance during mobility activities with:: Independent with assistive device  Goal: LTG Patient will maintain dynamic standing balance (PT) Description: LTG:  Patient will maintain dynamic standing balance with assistance during mobility activities (PT) Flowsheets (Taken 10/02/2021 1743) LTG: Pt will maintain dynamic standing balance during mobility activities with:: Minimal Assistance - Patient > 75%   Problem: RH Bed Mobility Goal: LTG Patient will perform bed mobility with assist (PT) Description: LTG: Patient will perform bed mobility with assistance, with/without cues (PT). Flowsheets (Taken 10/02/2021 1743) LTG: Pt will perform bed mobility with assistance level of: Minimal Assistance - Patient > 75%   Problem: RH Bed to Chair Transfers Goal: LTG Patient will perform bed/chair transfers w/assist (PT) Description: LTG: Patient will perform bed to chair transfers with assistance (PT). Flowsheets (Taken 10/02/2021 1743) LTG: Pt will perform Bed to Chair Transfers with assistance level: Minimal Assistance - Patient > 75%   Problem: RH Car Transfers Goal: LTG Patient will perform car transfers with assist (PT) Description: LTG: Patient will perform car transfers with assistance (PT). Flowsheets (Taken 10/02/2021 1743) LTG: Pt will perform car transfers with assist:: Moderate Assistance - Patient 50 - 74%   Problem: RH Ambulation Goal: LTG Patient will ambulate in controlled environment (PT) Description: LTG: Patient will ambulate in a controlled environment, # of feet with assistance (PT). Flowsheets (Taken 10/02/2021 1743) LTG: Pt will ambulate in controlled environ  assist needed:: Minimal Assistance - Patient >  75% LTG: Ambulation distance in controlled environment: 77ft with LRAD Goal: LTG Patient will ambulate in home environment (PT) Description: LTG: Patient will ambulate in home environment, # of feet with assistance (PT). Flowsheets (Taken 10/02/2021 1743) LTG: Pt will ambulate in home environ  assist needed:: Minimal Assistance - Patient > 75% LTG: Ambulation distance in home environment: 85ft with lRAD   Problem: RH Wheelchair Mobility Goal: LTG Patient will propel w/c in controlled environment (PT) Description: LTG: Patient will propel wheelchair in controlled environment, # of feet with assist (PT) Flowsheets (Taken 10/02/2021 1743) LTG: Pt will propel w/c in controlled environ  assist needed:: Supervision/Verbal cueing LTG: Propel w/c distance in controlled environment: 121ft with LRAD Goal: LTG Patient will propel w/c in home environment (PT) Description: LTG: Patient will propel wheelchair in home environment, # of feet with assistance (PT). Flowsheets (Taken 10/02/2021 1743) LTG: Pt will propel w/c in home environ  assist needed:: Supervision/Verbal cueing Distance: wheelchair distance in controlled environment: 25   Problem: RH Stairs Goal: LTG Patient will ambulate up and down stairs w/assist (PT) Description: LTG: Patient will ambulate up and down # of stairs with assistance (PT) Flowsheets (Taken 10/02/2021 1743) LTG: Pt will  ambulate up and down number of stairs: TBD

## 2021-10-03 LAB — PROTIME-INR
INR: 2.5 — ABNORMAL HIGH (ref 0.8–1.2)
Prothrombin Time: 27 seconds — ABNORMAL HIGH (ref 11.4–15.2)

## 2021-10-03 MED ORDER — WARFARIN SODIUM 2 MG PO TABS
2.0000 mg | ORAL_TABLET | Freq: Once | ORAL | Status: AC
  Administered 2021-10-03: 2 mg via ORAL
  Filled 2021-10-03: qty 1

## 2021-10-03 NOTE — Plan of Care (Signed)
°  Problem: Consults Goal: RH STROKE PATIENT EDUCATION Description: See Patient Education module for education specifics  Outcome: Progressing   Problem: RH BOWEL ELIMINATION Goal: RH STG MANAGE BOWEL WITH ASSISTANCE Description: STG Manage Bowel with  mod I Assistance. Outcome: Progressing Goal: RH STG MANAGE BOWEL W/MEDICATION W/ASSISTANCE Description: STG Manage Bowel with Medication with mod I Assistance. Outcome: Progressing   Problem: RH BLADDER ELIMINATION Goal: RH STG MANAGE BLADDER WITH ASSISTANCE Description: STG Manage Bladder Without Assistance Outcome: Progressing   Problem: RH SAFETY Goal: RH STG ADHERE TO SAFETY PRECAUTIONS W/ASSISTANCE/DEVICE Description: STG Adhere to Safety Precautions With cues Assistance/Device. Outcome: Progressing   Problem: RH KNOWLEDGE DEFICIT Goal: RH STG INCREASE KNOWLEDGE OF HYPERTENSION Description: Patient and daughter will be able to manage HTN with medications and dietary modifications using handouts and educational resources independently Outcome: Progressing Goal: RH STG INCREASE KNOWLEDGE OF DYSPHAGIA/FLUID INTAKE Description: Patient and daughter will be able to manage dysphagia ,  medications and dietary modifications using handouts and educational resources independently Outcome: Progressing Goal: RH STG INCREASE KNOWLEGDE OF HYPERLIPIDEMIA Description: Patient and daughter will be able to manage HLD with medications and dietary modifications using handouts and educational resources independently Outcome: Progressing Goal: RH STG INCREASE KNOWLEDGE OF STROKE PROPHYLAXIS Description: Patient and daughter will be able to manage secondary stroke risks with medications and dietary modifications using handouts and educational resources independently Outcome: Progressing   Problem: RH Vision Goal: RH LTG Vision (Specify) Outcome: Progressing

## 2021-10-03 NOTE — Progress Notes (Signed)
ANTICOAGULATION CONSULT NOTE - Follow Up Consult  Pharmacy Consult for enoxaparin + warfarin Indication:  Renal artery thrombosis , hx afib, possible antiphospholipid antibody syndrome  No Known Allergies  Patient Measurements: Heparin Dosing Weight: 66 kg   Vital Signs: Temp: 99.1 F (37.3 C) (01/01 0554) Temp Source: Oral (01/01 0554) BP: 156/83 (01/01 0554) Pulse Rate: 96 (01/01 0554)  Labs: Recent Labs    10/01/21 0045 10/02/21 0518 10/02/21 0936 10/03/21 0700  HGB 10.4* 10.3*  --   --   HCT 29.4* 29.1*  --   --   PLT 299 315  --   --   LABPROT 31.7*  --  31.9* 27.0*  INR 3.1*  --  3.1* 2.5*  CREATININE 1.19* 1.29*  --   --     Estimated Creatinine Clearance: 40.9 mL/min (A) (by C-G formula based on SCr of 1.29 mg/dL (H)).   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 Pt initially on heparin infusion, now to transition to warfarin with enoxaparin bridge given new clot while on apixaban.  Patient also undergoing evaluation for possible antiphospholipid antibody syndrome.   INR today 2.5 and at therapeutic goal. INR decrease likely from holding warfarin dose on 12/29; will increase dose today to maintain in range. Hemoglobin and platelets remaining stable. No signs of bleeding noted.   Goal of Therapy:  INR 2-3 Monitor platelets by anticoagulation protocol: Yes   Plan:  Warfarin 2 mg PO this evening Daily INR CBC tomorrow, then move to weekly on 1/4 Monitor for signs/symptoms of bleeding  Thank you for involving pharmacy in this patient's care.  Elita Quick, PharmD PGY1 Ambulatory Care Pharmacy Resident 10/03/2021 8:04 AM  **Pharmacist phone directory can be found on [email or web address] listed under [place]**

## 2021-10-03 NOTE — Progress Notes (Signed)
PROGRESS NOTE   Subjective/Complaints: No new complaints this morning.  INR therapeutic, lovenox d/ced, discussed with patient and she is happy about this, appreciate pharmacy management She expresses no complaints  ROS: Unable to obtain due to aphasia   Objective:   VAS [place] LOWER EXTREMITY VENOUS (DVT)  Result Date: 10/01/2021  Lower Venous DVT Study Patient Name:  ELVINA RAN  Date of Exam:   10/01/2021 Medical Rec #: [id number]      Accession #:    [id number] Date of Birth: June 21, 1956      Patient Gender: F Patient Age:   11 years Exam Location:  [facility] Procedure:      VAS [place] LOWER EXTREMITY VENOUS (DVT) Referring Phys: EMILY MULLEN --------------------------------------------------------------------------------  Indications: Left leg pain.  Limitations: Suboptimal patient positioning. Comparison       04-22-2020 Bilateral lower extremity venous was negative for Study:           DVT. Performing Technologist: Darlin Coco RDMS, RVT  Examination Guidelines: A complete evaluation includes B-mode imaging, spectral Doppler, color Doppler, and power Doppler as needed of all accessible portions of each vessel. Bilateral testing is considered an integral part of a complete examination. Limited examinations for reoccurring indications may be performed as noted. The reflux portion of the exam is performed with the patient in reverse Trendelenburg.  +---------+---------------+---------+-----------+----------+--------------+  LEFT      Compressibility Phasicity Spontaneity Properties Thrombus Aging  +---------+---------------+---------+-----------+----------+--------------+  CFV       Full            Yes       Yes                                    +---------+---------------+---------+-----------+----------+--------------+  SFJ       Full                                                              +---------+---------------+---------+-----------+----------+--------------+  FV Prox   Full                                                             +---------+---------------+---------+-----------+----------+--------------+  FV Mid    Full                                                             +---------+---------------+---------+-----------+----------+--------------+  FV Distal Full                                                             +---------+---------------+---------+-----------+----------+--------------+  PFV       Full                                                             +---------+---------------+---------+-----------+----------+--------------+  POP       Full            Yes       Yes                                    +---------+---------------+---------+-----------+----------+--------------+  PTV       Full                                                             +---------+---------------+---------+-----------+----------+--------------+  PERO      Full                                                             +---------+---------------+---------+-----------+----------+--------------+     Summary: LEFT: - There is no evidence of deep vein thrombosis in the lower extremity.  - No cystic structure found in the popliteal fossa.  *See table(s) above for measurements and observations. Electronically signed by Sherald Hess MD on 10/01/2021 at 4:30:00 PM.    Final    Recent Labs    10/01/21 0045 10/02/21 0518  WBC 7.4 6.4  HGB 10.4* 10.3*  HCT 29.4* 29.1*  PLT 299 315   Recent Labs    10/01/21 0045 10/02/21 0518  NA 130* 133*  K 3.9 3.7  CL 96* 99  CO2 22 22  GLUCOSE 110* 107*  BUN 10 11  CREATININE 1.19* 1.29*  CALCIUM 8.7* 8.7*    Intake/Output Summary (Last 24 hours) at 10/03/2021 1058 Last data filed at 10/03/2021 0700 Gross per 24 hour  Intake 120 ml  Output --  Net 120 ml        Physical Exam: Vital Signs Blood pressure (!) 156/83, pulse 96,  temperature 99.1 F (37.3 C), temperature source Oral, resp. rate 17, SpO2 100 %. Gen: no distress, normal appearing HEENT: oral mucosa pink and moist, NCAT Cardio: Reg rate Chest: normal effort, normal rate of breathing Abd: soft, non-distended Ext: no edema Psych: pleasant, recognized me and smiles, but mood is more depressed than usual Skin: intact    Mental Status: She is alert.     Comments: Pt is alert. Follows simple basic commands. Nods head "yes/no" to simple questions. Exp>rec aphasia. Did not engage much however during exam due to mood. RUE tr/5. RLE 1-2/5 prox to 0-trace distally (poor effort). Moves left arm and leg freely. Decreased pain sense Right arm and leg. Does have resting flexor tone right wrist and elbow.        Assessment/Plan: 1. Functional deficits which require 3+ hours per day of interdisciplinary therapy in a comprehensive inpatient rehab setting. Physiatrist is  providing close team supervision and 24 hour management of active medical problems listed below. Physiatrist and rehab team continue to assess barriers to discharge/monitor patient progress toward functional and medical goals  Care Tool:  Bathing    Body parts bathed by patient: Face, Left upper leg   Body parts bathed by helper: Buttocks, Front perineal area, Left lower leg, Right lower leg, Face, Abdomen, Chest, Right arm, Left arm     Bathing assist Assist Level: Total Assistance - Patient < 25%     Upper Body Dressing/Undressing Upper body dressing   What is the patient wearing?: Hospital gown only, Pull over shirt    Upper body assist Assist Level: Maximal Assistance - Patient 25 - 49%    Lower Body Dressing/Undressing Lower body dressing      What is the patient wearing?: Pants, Incontinence brief     Lower body assist Assist for lower body dressing: 2 Helpers     Toileting Toileting    Toileting assist Assist for toileting: 2 Helpers     Transfers Chair/bed  transfer  Transfers assist     Chair/bed transfer assist level: Maximal Assistance - Patient 25 - 49%     Locomotion Ambulation   Ambulation assist   Ambulation activity did not occur: Safety/medical concerns          Walk 10 feet activity   Assist  Walk 10 feet activity did not occur: Safety/medical concerns        Walk 50 feet activity   Assist Walk 50 feet with 2 turns activity did not occur: Safety/medical concerns         Walk 150 feet activity   Assist Walk 150 feet activity did not occur: Safety/medical concerns         Walk 10 feet on uneven surface  activity   Assist Walk 10 feet on uneven surfaces activity did not occur: Safety/medical concerns         Wheelchair     Assist Is the patient using a wheelchair?: Yes Type of Wheelchair: Manual    Wheelchair assist level: Minimal Assistance - Patient > 75% Max wheelchair distance: 150    Wheelchair 50 feet with 2 turns activity    Assist        Assist Level: Minimal Assistance - Patient > 75%   Wheelchair 150 feet activity     Assist      Assist Level: Minimal Assistance - Patient > 75%   Blood pressure (!) 156/83, pulse 96, temperature 99.1 F (37.3 C), temperature source Oral, resp. rate 17, SpO2 100 %.  Medical Problem List and Plan: 1. 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 She'll need some motivation at times to participate  Start B/C vitamin complex  Check Vitamin D level on Monday  Continue CIR 2.  Antithrombotics: -DVT/anticoagulation:  INR 2.5 today, warfarin 2mg  today             -antiplatelet therapy: Aspirin 81 mg daily 3. Pain Management: Tylenol 4. Situational depression: LCSW to evaluate and provide emotional support             -antipsychotic  agents: N/A             -daughter  supportive. Pt is unhappy about still being in the hospital. Will need encouragement and positive reinforcement from team. Discuss with daughter whether she may benefit from antidepressant 5. Neuropsych: This patient is not capable of making decisions on her own behalf. 6. Skin/Wound Care: Routine skin care checks 7. Fluids/Electrolytes/Nutrition: Ins and outs and follow-up chemistries. 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  Pending hypercoagulable labs, primary care provider could consider transitioning back to NOAC if the work-up is negative.  Consider hematology referral as outpatient. 9.  Atrial fibrillation with RVR: amlodipine held. Diltiazem infusion weaned. HE improved -- continue metoprolol 50 mg BID -HR elevated to 150 this morning with PT. Observe for patterns.  -consider resuming amlodipine (home med) bp permitting -magnesium low but will not supplement given AKI 10: Klebsiella positive, multifocal pyelonephritis with abscess: Infectious disease recommend transition from intravenous cefazolin to cefadroxil twice a day on discharge to complete a 4-week treatment course.  Stop date of the 10/20/2021. 11: Possible seizure activity:  --continue Keppra 12: Hypertension: stable --metoprolol 50 mg BID -- Resume home amlodipine as needed 13: AKI: baseline Scr> 0.70. 1.74 on 12/26, 1.32 on 12/28. Monitor trend. 14.  Mild anemia: No indication for transfusion.  Monitor H&H.   LOS: 2 days A FACE TO FACE EVALUATION WAS PERFORMED  Clide Deutscher Antonino Nienhuis 10/03/2021, 10:58 AM

## 2021-10-04 LAB — CBC
HCT: 28.3 % — ABNORMAL LOW (ref 36.0–46.0)
Hemoglobin: 9.7 g/dL — ABNORMAL LOW (ref 12.0–15.0)
MCH: 28.8 pg (ref 26.0–34.0)
MCHC: 34.3 g/dL (ref 30.0–36.0)
MCV: 84 fL (ref 80.0–100.0)
Platelets: 361 10*3/uL (ref 150–400)
RBC: 3.37 MIL/uL — ABNORMAL LOW (ref 3.87–5.11)
RDW: 15.5 % (ref 11.5–15.5)
WBC: 7.1 10*3/uL (ref 4.0–10.5)
nRBC: 0 % (ref 0.0–0.2)

## 2021-10-04 LAB — PROTIME-INR
INR: 2.3 — ABNORMAL HIGH (ref 0.8–1.2)
Prothrombin Time: 25 seconds — ABNORMAL HIGH (ref 11.4–15.2)

## 2021-10-04 LAB — VITAMIN D 25 HYDROXY (VIT D DEFICIENCY, FRACTURES): Vit D, 25-Hydroxy: 39.86 ng/mL (ref 30–100)

## 2021-10-04 MED ORDER — PATIENT'S GUIDE TO USING COUMADIN BOOK
Freq: Once | Status: AC
  Filled 2021-10-04: qty 1

## 2021-10-04 MED ORDER — BLOOD PRESSURE CONTROL BOOK
Freq: Once | Status: AC
  Filled 2021-10-04: qty 1

## 2021-10-04 MED ORDER — WARFARIN SODIUM 2 MG PO TABS
2.0000 mg | ORAL_TABLET | Freq: Once | ORAL | Status: AC
  Administered 2021-10-04: 2 mg via ORAL
  Filled 2021-10-04: qty 1

## 2021-10-04 MED ORDER — CITALOPRAM HYDROBROMIDE 10 MG PO TABS
20.0000 mg | ORAL_TABLET | Freq: Every day | ORAL | Status: DC
  Administered 2021-10-04 – 2021-10-06 (×3): 20 mg via ORAL
  Filled 2021-10-04 (×3): qty 2

## 2021-10-04 MED ORDER — OFF THE BEAT BOOK
Freq: Once | Status: AC
  Filled 2021-10-04: qty 1

## 2021-10-04 NOTE — Progress Notes (Signed)
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 No pertinent surgical history. Social History:  reports that she has never smoked. She has never used smokeless tobacco. She reports that she does not currently use alcohol. She reports that she does not use drugs.  Family / Support Systems Marital Status: Single Patient Roles: Parent Children: [name] [phone] Other Supports: Deborah-sister [phone] Anticipated Caregiver: [name] Ability/Limitations of Caregiver: has been providing care prior to admission after previous CVA in 03/2021 Caregiver Availability: 24/7 Family Dynamics: Close knit with daughter and sister, daughter and grandson lives with her, sister is not local. Daughter voiced frustration starting over again with her progress.  Social History Preferred language: English Religion: Christian Cultural Background: No issues Education: HS Health Literacy - How often do you need to have someone help you when you read instructions, pamphlets, or other written material from your doctor or pharmacy?: Patient unable to respond Writes: Yes Employment Status: Retired Marine scientist Issues: No issues Guardian/Conservator: None-according to MD pt is not fully capable of making her own decisions at this time. 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 Daughter reports she was doing well and had progressed to using a cane and now this. Pt has always been very independent until she started having strokes. Daughter is tearful when talking about Mom;s recent health issues Recent Psychosocial Issues: past CVA and still have deficits from this when this occurred Psychiatric History: has been depressed since CVA's MD has started on celexa and once appropriate for neuro-psych will ask for her to be seen while here Substance Abuse History: No issues  Patient / Family Perceptions, Expectations & Goals Pt/Family understanding of illness & functional limitations: Daughter can explain her strokes and deficits, she was so hopeful after last CVA and the progress she had made and now this. She talks with the MD and feels she has a good understanding of plan moving forward. She is hopeful Mom will do well again and glad she is here. Premorbid pt/family roles/activities: Mom, grandmother, retiree, church member, etc Anticipated changes in roles/activities/participation: resume Pt/family expectations/goals: Pt does nod to yes and no questions, seemed to understand this worker would call her daughter and speak with. She nodded yes to this. Daughter states: " It is very difficult she came so far and now she has to start all over again."  [employer]: Other (Comment) (Had finsined OP three weeks prior to this hospitalization) Premorbid Home Care/DME Agencies: Other (Comment) (hospital bed, wheelchair, rw, tub seat) Transportation available at discharge: Daughter was providing Is the patient able to  respond to transportation needs?:  Yes In the past 12 months, has lack of transportation kept you from medical appointments or from getting medications?: No In the past 12 months, has lack of transportation kept you from meetings, work, or from getting things needed for daily living?: No Resource referrals recommended: Neuropsychology, Support group (specify)  Discharge Planning Living Arrangements: Children, Other relatives Support Systems: Children, Social worker community, Other relatives, Friends/neighbors Type of Residence: Private residence Insurance Resources: [employer], [employer] (specify county) Pensions consultant: Fish farm manager, Family Support Financial Screen Referred: No Living Expenses: Own Money Management: Family Does the patient have any problems obtaining your medications?: No Home Management: Daughter and pt assisted with what she was able to Patient/Family Preliminary Plans: Daughter hopes she can return home with her and her 3 yo son-pt's grandson. Daughter reports she works in [place] and needs to continue this. Unsure who would provide 24/7 care, informed her pt will require this at discharge from CIR. She wants to wait and see she became tearful when discussing this. Will update once team conference on Wed 1/4. Care Coordinator Barriers to Discharge: Decreased caregiver support Care Coordinator Anticipated Follow Up Needs: HH/OP, SNF, Support Group  Clinical Impression Pleasant globally aphasic female who does nod her head to yes/no questions. She had been recovering from her previous stroke and was doing well ambulating with a cane when this occurred. Both pt and daughter somewhat depressed her recovery will start all over again. Daughter aware will need 24/7 care at discharge from here and is not sure what her plan is. Will work with daughter and pt on best plan for her. Neuro-psych to see once more appropriate with speech/language issues.  Elease Hashimoto 10/04/2021,  10:44 AM

## 2021-10-04 NOTE — Progress Notes (Signed)
Physical Therapy Session Note  Patient Details  Name: Lindsey [name] MRN: [id number] Date of Birth: 24-May-1956  Today's Date: 10/04/2021 PT Individual Time: 0800-0854  PT Individual Time Calculation (min): 54 min    Short Term Goals: Week 1:  PT Short Term Goal 1 (Week 1): Patient will perform bed mobility with min A >50% of the time without use of hospital bed features. PT Short Term Goal 2 (Week 1): Patient will perform basic transfers with min A >50% of the time using LRAD. PT Short Term Goal 3 (Week 1): Patient will ambulate >50 feet with min A using LRAD. PT Short Term Goal 4 (Week 1): Patient will improved Berg Balance Scale by 7 points to meed MCD.  Skilled Therapeutic Interventions/Progress Updates:      1st session: Pt supine in bed to start - appears agreeable to therapy session but significant expressive > receptive aphasia limiting her communication. Does not appear to be in any pain. Pt with very flat affect throughout session, appearing depressed. Pt requiring totalA for LB dressing at bed level - able to roll in bed towards her R side with modA and requires totalA for rolling L. Supine<>sit with HOB flat with use of bed rail, requiring maxA for both BLE and trunk management. Able to statically sit EOB with CGA but requires minA for dynamic sitting balance while completing UB dressing. Required maxA for UB dressing. Donned socks, R solid AFO, and shoes with totalA for time. Pt expressing desire to go home - ed on goals of rehab and purpose of returning home as soon as she's physically safe. Sit<>stand with modA and stand<>pivot with maxA to w/c. Transported in w/c to main rehab gym for time and wheeled inside // bars. Focused remainder of session on gait training within // bars. Sit<>stand with modA in bars (pulling herself up to stand with her LUE) and R knee blocked to prevent buckling. Able to take a few short steps, ~42ft + ~66ft + ~84ft with mod/maxA within bars. Demo's ?tremors in  LLE while attempting to step and requires totalA (pt25%) for advancing RLE in gait cycle. No evidence of true knee buckling observed. Standing balances requires minA during gait. Difficulty abducting her RUE onto bar due to tone and unable to grasp bar due to flaccidity. Pt returned to her room, remained seated in w/c with safety belt alarm on and all immediate needs within reach. Informed of upcoming therapy schedule.   2nd session: Pt sleeping in bed on arrival. Grandson at bedside. Pt awakens to voice but then shuts her eyes. Grandson attempted to assist encouraging her to participate in therapy but she refused, shaking her head 'no.' LPN notified and pt missed 30 minutes of therapy.   Therapy Documentation Precautions:  Precautions Precautions: Fall, Other (comment) Precaution Comments: Baseline R-side hemiplegia; global aphasia Required Braces or Orthoses: Other Brace, Splint/Cast Splint/Cast: Baseline RUE resting hand splint Other Brace: Baseline R AFO for ambulation Restrictions Weight Bearing Restrictions: No General:     Therapy/Group: Individual Therapy  Alger Simons 10/04/2021, 7:36 AM

## 2021-10-04 NOTE — Progress Notes (Signed)
Speech Language Pathology Daily Session Note  Patient Details  Name: Lindsey [name] MRN: [id number] Date of Birth: [birth year]  Today's Date: 10/04/2021 SLP Individual Time: [id number] SLP Individual Time Calculation (min): 43 min  Short Term Goals: Week 1: SLP Short Term Goal 1 (Week 1): Patient will consume current diet with minimal overt s/s of aspiration with Min verbal cues for use of swallowing compensatory strategies. SLP Short Term Goal 2 (Week 1): Patient will demonstrate selective attention to a task in a mildly distracting enviornment for 20 minutes with Min verbal cues for redirection. SLP Short Term Goal 3 (Week 1): Patient will utilize multimodal communication to express wants/needs with Mod A multimodal cues. SLP Short Term Goal 4 (Week 1): Patient will answer mildly abstract yes/no questions with 75% accuracy and Mod verbal cues. SLP Short Term Goal 5 (Week 1): Patient will produce 3 words that are personally relevant/significant with 50% accuracy and Mod A multimodal cues.  Skilled Therapeutic Interventions: 1st session: Pt seen for skilled ST with focus on speech/language goals, pt upright in the chair and agreeable to therapeutic tasks. Pt shaking head no to pain questions. Pt with increased accuracy of yes/no responses this date, did nod head "yes" when asked "are you sad?". SLP following up with "are you feeling anxious" which pt replied "to get out of here". Reviewed biographical information SLP asked daughter to provide in memory notebook, pt enjoyed hearing about her family/life. Pt did smile a few times during tx session but appears depressed, spoke with PT about this. SLP facilitating picture matching activity in field of 4 progressing to field of 8 with mod A fading to min A cues. When pt would make an error, SLP would show correct match and patient would say "Ooooh!". Pt counting 1-10 independently and clearly, singing Happy Birthday with ~80% accuracy/intelligibility. Pt  participating in sentence completion activity with mod A cues for 50% accuracy. Pt left in wheelchair with alarm belt present and all needs within reach.  2nd session: Pt in bed sleeping soundly when SLP entered, grandson present and states she has been sleeping heavily. Pt opens eyes with extra time but closes them quickly and falls back asleep. Pt politely declining participation in skilled ST at this time 2' fatigue. Will cont ST POC.   Pain Pain Assessment Pain Scale: 0-10 Pain Score: 0-No pain  Therapy/Group: Individual Therapy  Dewaine Conger 10/04/2021, 9:42 AM

## 2021-10-04 NOTE — IPOC Note (Signed)
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 [facility])     Functional Problem List: Nursing Bladder, Bowel, Medication Management, Safety, Endurance  PT Balance, Behavior, Edema, Endurance, Motor, Nutrition, Pain, Perception, Safety, Sensory, Skin Integrity  OT Balance, Cognition, Endurance, Motor, Perception, Safety, Vision, Skin Integrity, Sensory  SLP Cognition, Linguistic, Nutrition  TR         Basic ADLs: OT Eating, Grooming, Bathing, Dressing, Toileting     Advanced  ADLs: OT       Transfers: PT Bed Mobility, Bed to Chair, Car, [employer], Futures trader, Metallurgist: PT Ambulation, Emergency planning/management officer, Stairs     Additional Impairments: OT Fuctional Use of Upper Extremity  SLP Swallowing, Communication, Social Cognition comprehension, expression Attention, Awareness, Social Interaction  TR      Anticipated Outcomes Item Anticipated Outcome  Self Feeding S  Swallowing  Supervision   Basic self-care  min A  Toileting  min A   Bathroom Transfers min A  Bowel/Bladder  manage bowel with mod I and bladder without assist  Transfers  min A with LRAD  Locomotion  Min A with with LRAD  Communication  Mod A  Cognition  Min A  Pain  n/a  Safety/Judgment  maintain w cues   Therapy Plan: PT Intensity: Minimum of 1-2 x/day ,45 to 90 minutes PT Frequency: 5 out of 7 days PT Duration Estimated Length of Stay: 10-14 days OT Intensity: Minimum of 1-2 x/day, 45 to 90 minutes OT Frequency: 5 out of 7 days OT Duration/Estimated Length of Stay: 18-21 days SLP Intensity: Minumum of 1-2 x/day, 30 to 90 minutes SLP Frequency: 3 to 5 out of 7 days SLP  Duration/Estimated Length of Stay: 18-21 days   Due to the current state of emergency, patients may not be receiving their 3-hours of Medicare-mandated therapy.   Team Interventions: Nursing Interventions Bladder Management, Disease Management/Prevention, Medication Management, Discharge Planning, Bowel Management, Patient/Family Education  PT interventions Ambulation/gait training, Cognitive remediation/compensation, Discharge planning, DME/adaptive equipment instruction, Functional mobility training, Pain management, Psychosocial support, Splinting/orthotics, Therapeutic Activities, UE/LE Strength taining/ROM, Visual/perceptual remediation/compensation, Wheelchair propulsion/positioning, UE/LE Coordination activities, Therapeutic Exercise, Skin care/wound management, Patient/family education, Neuromuscular re-education, Functional electrical stimulation, Disease management/prevention, Academic librarian, Training and development officer  OT Interventions Training and development officer, Functional electrical stimulation, Pain management, Self Care/advanced ADL retraining, Therapeutic Activities, UE/LE Coordination activities, DME/adaptive equipment instruction, UE/LE Strength taining/ROM, Neuromuscular re-education, Wheelchair propulsion/positioning, Cognitive remediation/compensation, Discharge planning, Disease mangement/prevention, Patient/family education, Functional mobility training, Therapeutic Exercise, Visual/perceptual remediation/compensation, Community reintegration, Psychosocial support, Splinting/orthotics, Skin care/wound managment  SLP Interventions Cognitive remediation/compensation, Dysphagia/aspiration precaution training, Internal/external aids, Speech/Language facilitation, English as a second language teacher, Environmental controls, Therapeutic Activities, Functional tasks, Patient/family education  TR Interventions    SW/CM Interventions Discharge Planning, Psychosocial Support, Patient/Family Education    Barriers to Discharge MD  Medical stability, Home enviroment access/loayout, Incontinence, Neurogenic bowel and bladder, Lack of/limited family support, Behavior, and depression and on warfarin  Nursing Decreased caregiver support, Home environment access/layout Lives w daughter in 2 level ramped entry, stair lift upstairs, sleeps in bedroom on main but bathes upstairs; 1/2 bath on main  PT Other (comments), Decreased caregiver support    OT      SLP      SW  Team Discharge Planning: Destination: PT-Home ,OT- Home , SLP-Home Projected Follow-up: PT-Home health PT, OT-  Outpatient OT, 24 hour supervision/assistance, Home health OT, SLP-Outpatient SLP, 24 hour supervision/assistance, Home Health SLP Projected Equipment Needs: PT-To be determined, OT- To be determined, SLP-None recommended by SLP Equipment Details: PT- , OT-  Patient/family involved in discharge planning: PT- Patient,  OT-Patient unable/family or caregiver not available, SLP-Patient, Family member/caregiver  MD ELOS: 18-21 days Medical Rehab Prognosis:  Fair Assessment: Pt is a 66 yr old female with stroke and aphasia; as well as AKI, Afib with RVR prior; on Coumadin; HTN and anemia Also very depressed- will start Celexa for mood.   Goals- supervision to min A    See Team Conference Notes for weekly updates to the plan of care

## 2021-10-04 NOTE — Progress Notes (Signed)
ANTICOAGULATION CONSULT NOTE - Follow Up Consult  Pharmacy Consult for Warfarin Indication:  Renal artery thrombosis , hx afib, possible antiphospholipid antibody syndrome  No Known Allergies  Patient Measurements: Heparin Dosing Weight: 66 kg   Vital Signs: Temp: 98.5 F (36.9 C) (01/02 0331) Temp Source: Oral (01/02 0331) BP: 120/93 (01/02 0331) Pulse Rate: 82 (01/02 0331)  Labs: Recent Labs    10/02/21 0518 10/02/21 0936 10/03/21 0700 10/04/21 0521  HGB 10.3*  --   --  9.7*  HCT 29.1*  --   --  28.3*  PLT 315  --   --  361  LABPROT  --  31.9* 27.0* 25.0*  INR  --  3.1* 2.5* 2.3*  CREATININE 1.29*  --   --   --     Estimated Creatinine Clearance: 40.9 mL/min (A) (by C-G formula based on SCr of 1.29 mg/dL (H)).   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 Pt initially on heparin infusion, now to transition to warfarin with enoxaparin bridge given new clot while on apixaban.  Patient also undergoing evaluation for possible antiphospholipid antibody syndrome.  Lovenox now stopped  INR 2.3 today   Goal of Therapy:  INR 2-3 Monitor platelets by anticoagulation protocol: Yes   Plan:  Warfarin 2 mg PO this evening Daily INR  Thank you Okey Regal, PharmD  10/04/2021 8:04 AM  **Pharmacist phone directory can be found on [email or web address] listed under [facility]**

## 2021-10-04 NOTE — Progress Notes (Addendum)
Inpatient Rehabilitation Center Individual Statement of Services  Patient Name:  Lindsey [name]  Date:  10/04/2021  Welcome to the [facility].  Our goal is to provide you with an individualized program based on your diagnosis and situation, designed to meet your specific needs.  With this comprehensive rehabilitation program, you will be expected to participate in at least 3 hours of rehabilitation therapies Monday-Friday, with modified therapy programming on the weekends.  Your rehabilitation program will include the following services:  Physical Therapy (PT), Occupational Therapy (OT), Speech Therapy (ST), 24 hour per day rehabilitation nursing, Therapeutic Recreaction (TR), Care Coordinator, Rehabilitation Medicine, Nutrition Services,  Neuro-psych and Pharmacy Services  Weekly team conferences will be held on Wednesday to discuss your progress.  Your Inpatient Rehabilitation Care Coordinator will talk with you frequently to get your input and to update you on team discussions.  Team conferences with you and your family in attendance may also be held.  Expected length of stay: 14-20 days  Overall anticipated outcome: min assist level  Depending on your progress and recovery, your program may change. Your Inpatient Rehabilitation Care Coordinator will coordinate services and will keep you informed of any changes. Your Inpatient Rehabilitation Care Coordinator's name and contact numbers are listed  below.  The following services may also be recommended but are not provided by the [facility]:   [place] will be made to provide these services after discharge if needed.  Arrangements include referral to agencies that provide these services.  Your insurance has been verified to be:  Medicare and Medicaid Your primary doctor is:  Radiographer, therapeutic  Pertinent information will be shared  with your doctor and your insurance company.  Inpatient Rehabilitation Care Coordinator:  Ovidio Kin, [place] or Emilia Beck  Information discussed with and copy given to patient by: Elease Hashimoto, 10/04/2021, 9:05 AM

## 2021-10-04 NOTE — Progress Notes (Signed)
Occupational Therapy Session Note  Patient Details  Name: Lindsey [name] MRN: [id number] Date of Birth: 1956-05-20  Today's Date: 10/04/2021 OT Individual Time: 1030-1130 OT Individual Time Calculation (min): 60 min    Short Term Goals: Week 1:  OT Short Term Goal 1 (Week 1): Pt will complete sit to stand with no lift and max A in prep for standing ADL. OT Short Term Goal 2 (Week 1): Pt will complete grooming task at sink with no more than min VCs and S. OT Short Term Goal 3 (Week 1): Pt will don shirt with max A. OT Short Term Goal 4 (Week 1): Pt will bathe UB with mod A.  Skilled Therapeutic Interventions/Progress Updates:    Pt sitting up in w/c, appearing somewhat apprehensive to participating with OT, stating "yes" to fatigue.  No pain per pt.  Pt also stating "yes" to needing to use bathroom therefore pt agreeable to session with focus on toileting and transfer training.  Pt completed squat pivot w/c to toilet using grab bar with mod assist and step by step multimodal cues.  Sit to stand requiring mod assist for initial power up then min assist to maintain balance while pulling pants and brief down past hips.  Pt had continent episode of stool and urine (see flowsheet).  Pericare requiring min assist for balance and min assist for thoroughness.  Clothing management required mod assist to manage brief change and pulling over hip on right side. Pt required multiple seated rest breaks while performing toileting. Squat pivot to w/c with mod assist.    Pt transported to sink via w/c, and pt required min assist and step by step cues to initiate and sequence hand washing.  Pt noted with posterior pelvic tilt and therapist provided visual demo and multimodal cues to educate on weight shifting for repositioning pelvis.  Pt required total assist for follow through.  Pts grandson arriving and concerned about pt's food intake. Made nurse tech aware of pts grandson request for a snack to be offered.   Provided right half lap tray to improve RUE positioning and support. Assessed orthosis and grandson reports she has been wearing current custom RHS full time except when going out of the house.  Good skin integrity noted right wrist and hand.  Call bell in reach, seat alarm on, at end of session.    Motor planning and sequencing/initiation impairments prominent during this session limiting her independence during toileting and sinkside hygiene. Also noted limitations in standing endurance which impacted her as well. Will benefit from standing endurance training, neuro re-ed during functional mobility, as well as fading of cues to facilitate initiation and sequencing during functional tasks.     Therapy Documentation Precautions:  Precautions Precautions: Fall, Other (comment) Precaution Comments: Baseline R-side hemiplegia; global aphasia Required Braces or Orthoses: Other Brace, Splint/Cast Splint/Cast: Baseline RUE resting hand splint Other Brace: Baseline R AFO for ambulation Restrictions Weight Bearing Restrictions: No   Therapy/Group: Individual Therapy  Ezekiel Slocumb 10/04/2021, 1:18 PM

## 2021-10-04 NOTE — Progress Notes (Signed)
PROGRESS NOTE   Subjective/Complaints: No new complaints this morning.  Pt sleepy, but woke briefly- nodded yes and no appropriately- but denies any issues.    ROS: unable to assess due to aphasia   Objective:   No results found. Recent Labs    10/02/21 0518 10/04/21 0521  WBC 6.4 7.1  HGB 10.3* 9.7*  HCT 29.1* 28.3*  PLT 315 361   Recent Labs    10/02/21 0518  NA 133*  K 3.7  CL 99  CO2 22  GLUCOSE 107*  BUN 11  CREATININE 1.29*  CALCIUM 8.7*    Intake/Output Summary (Last 24 hours) at 10/04/2021 0859 Last data filed at 10/03/2021 2200 Gross per 24 hour  Intake 290 ml  Output --  Net 290 ml        Physical Exam: Vital Signs Blood pressure (!) 120/93, pulse 82, temperature 98.5 F (36.9 C), temperature source Oral, resp. rate 18, SpO2 100 %.   General: asleep- woke briefly; aphasic; NAD HENT: conjugate gaze; oropharynx moist CV: regular rate; no JVD Pulmonary: CTA B/L; no W/R/R- good air movement GI: soft, NT, ND, (+)BS Psychiatric: appropriate; very flat affect Neurological: aphasic; sleepy; mods head yes and no appropriately.  Skin: intact    Mental Status: She is alert.     Comments: Pt is alert. Follows simple basic commands. Nods head "yes/no" to simple questions. Exp>rec aphasia. Did not engage much however during exam due to mood. RUE tr/5. RLE 1-2/5 prox to 0-trace distally (poor effort). Moves left arm and leg freely. Decreased pain sense Right arm and leg. Does have resting flexor tone right wrist and elbow.        Assessment/Plan: 1. Functional deficits which require 3+ hours per day of interdisciplinary therapy in a comprehensive inpatient rehab setting. Physiatrist is providing close team supervision and 24 hour management of active medical problems listed below. Physiatrist and rehab team continue to assess barriers to discharge/monitor patient progress toward functional and medical  goals  Care Tool:  Bathing    Body parts bathed by patient: Face, Left upper leg   Body parts bathed by helper: Buttocks, Front perineal area, Left lower leg, Right lower leg, Face, Abdomen, Chest, Right arm, Left arm     Bathing assist Assist Level: Total Assistance - Patient < 25%     Upper Body Dressing/Undressing Upper body dressing   What is the patient wearing?: Hospital gown only, Pull over shirt    Upper body assist Assist Level: Maximal Assistance - Patient 25 - 49%    Lower Body Dressing/Undressing Lower body dressing      What is the patient wearing?: Pants, Incontinence brief     Lower body assist Assist for lower body dressing: 2 Helpers     Toileting Toileting    Toileting assist Assist for toileting: 2 Helpers     Transfers Chair/bed transfer  Transfers assist     Chair/bed transfer assist level: Maximal Assistance - Patient 25 - 49%     Locomotion Ambulation   Ambulation assist   Ambulation activity did not occur: Safety/medical concerns          Walk 10 feet activity  Assist  Walk 10 feet activity did not occur: Safety/medical concerns        Walk 50 feet activity   Assist Walk 50 feet with 2 turns activity did not occur: Safety/medical concerns         Walk 150 feet activity   Assist Walk 150 feet activity did not occur: Safety/medical concerns         Walk 10 feet on uneven surface  activity   Assist Walk 10 feet on uneven surfaces activity did not occur: Safety/medical concerns         Wheelchair     Assist Is the patient using a wheelchair?: Yes Type of Wheelchair: Manual    Wheelchair assist level: Minimal Assistance - Patient > 75% Max wheelchair distance: 150    Wheelchair 50 feet with 2 turns activity    Assist        Assist Level: Minimal Assistance - Patient > 75%   Wheelchair 150 feet activity     Assist      Assist Level: Minimal Assistance - Patient > 75%    Blood pressure (!) 120/93, pulse 82, temperature 98.5 F (36.9 C), temperature source Oral, resp. rate 18, SpO2 100 %.  Medical Problem List and Plan: 1. 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 She'll need some motivation at times to participate  Start B/C vitamin complex  Check Vitamin D level on Monday  Continue CIR- PT, OT and SLP 2.  Antithrombotics: -DVT/anticoagulation:  INR 2.5 today, warfarin 2mg  today  1/2- INR 2.3             -antiplatelet therapy: Aspirin 81 mg daily 3. Pain Management: Tylenol 4. Situational depression: LCSW to evaluate and provide emotional support             -antipsychotic agents: N/A             -daughter supportive. Pt is unhappy about still being in the hospital. Will need encouragement and positive reinforcement from team. Discuss with daughter whether she may benefit from antidepressant  1/2- Will try Celexa 20 mg daily. Very flat affect 5. Neuropsych: This patient is not capable of making decisions on her own behalf. 6. Skin/Wound Care: Routine skin care checks 7. Fluids/Electrolytes/Nutrition: Ins and outs and follow-up chemistries. 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  Pending hypercoagulable labs, primary care provider could consider transitioning back to NOAC if the work-up is negative.  Consider hematology referral as outpatient. 9.  Atrial fibrillation with RVR: amlodipine held. Diltiazem infusion weaned. HE improved -- continue metoprolol 50 mg BID -HR elevated to 150 this morning with PT. Observe for patterns.  -consider resuming amlodipine (home med) bp permitting -magnesium low but will not supplement given AKI 10: Klebsiella positive, multifocal pyelonephritis with abscess: Infectious disease recommend  transition from intravenous cefazolin to cefadroxil twice a day on discharge to complete a 4-week treatment course.  Stop date of the 10/20/2021. 11: Possible seizure activity:  --continue Keppra 12: Hypertension: stable --metoprolol 50 mg BID -- Resume home amlodipine as needed  1/2- BP 120/93 this AM- will con't regimen for now and monitor 13: AKI: baseline Scr> 0.70. 1.74 on 12/26,  1.32 on 12/28. Monitor trend.  1/2- Cr 1.29 yesterday-  14.  Mild anemia: No indication for transfusion.  Monitor H&H.  1/2- Hb dropped slightly to 9.7 from 10.3   LOS: 3 days A FACE TO FACE EVALUATION WAS PERFORMED  Lindsey [name] 10/04/2021, 8:59 AM

## 2021-10-04 NOTE — Progress Notes (Signed)
Inpatient Rehabilitation  Patient information reviewed and entered into eRehab system by Yarexi Pawlicki M. Rondal Vandevelde, M.A., CCC/SLP, PPS Coordinator.  Information including medical coding, functional ability and quality indicators will be reviewed and updated through discharge.    

## 2021-10-05 LAB — PROTIME-INR
INR: 3.3 — ABNORMAL HIGH (ref 0.8–1.2)
Prothrombin Time: 33.8 seconds — ABNORMAL HIGH (ref 11.4–15.2)

## 2021-10-05 MED ORDER — VITAMIN D (ERGOCALCIFEROL) 1.25 MG (50000 UNIT) PO CAPS
50000.0000 [IU] | ORAL_CAPSULE | ORAL | Status: DC
  Administered 2021-10-05 – 2021-10-19 (×3): 50000 [IU] via ORAL
  Filled 2021-10-05 (×3): qty 1

## 2021-10-05 NOTE — Progress Notes (Signed)
Speech Language Pathology Daily Session Note  Patient Details  Name: Averlee Redfern MRN: [id number] Date of Birth: 21-Dec-1955  Today's Date: 10/05/2021 SLP Individual Time: 1332-1400 SLP Individual Time Calculation (min): 28 min  Short Term Goals: Week 1: SLP Short Term Goal 1 (Week 1): Patient will consume current diet with minimal overt s/s of aspiration with Min verbal cues for use of swallowing compensatory strategies. SLP Short Term Goal 2 (Week 1): Patient will demonstrate selective attention to a task in a mildly distracting enviornment for 20 minutes with Min verbal cues for redirection. SLP Short Term Goal 3 (Week 1): Patient will utilize multimodal communication to express wants/needs with Mod A multimodal cues. SLP Short Term Goal 4 (Week 1): Patient will answer mildly abstract yes/no questions with 75% accuracy and Mod verbal cues. SLP Short Term Goal 5 (Week 1): Patient will produce 3 words that are personally relevant/significant with 50% accuracy and Mod A multimodal cues.  Skilled Therapeutic Interventions: Pt seen for skilled ST with focus on speech and language goals, pt in bed with grandson present. Pt agreeable to therapy with encouragement, continues to report depression and verbalizing "I want to go home!" SLP facilitating simple object naming task by providing max A cues for 40% accuracy. Pt able to complete automatic speech tasks with min A cues this date. Functional yes/no questions related to wants and needs benefiting from mod A cues for 75% accuracy. Pt left in bed with alarm set and grandson present for needs. Cont ST POC.   Pain Pain Assessment Pain Scale: 0-10 Pain Score: 0-No pain  Therapy/Group: Individual Therapy  Dewaine Conger 10/05/2021, 4:01 PM

## 2021-10-05 NOTE — Progress Notes (Signed)
Physical Therapy Session Note  Patient Details  Name: Lindsey [name] MRN: [id number] Date of Birth: [birth year]  Today's Date: 10/05/2021 PT Individual Time: [id number] PT Individual Time Calculation (min): 55 min   Short Term Goals: Week 1:  PT Short Term Goal 1 (Week 1): Patient will perform bed mobility with min A >50% of the time without use of hospital bed features. PT Short Term Goal 2 (Week 1): Patient will perform basic transfers with min A >50% of the time using LRAD. PT Short Term Goal 3 (Week 1): Patient will ambulate >50 feet with min A using LRAD. PT Short Term Goal 4 (Week 1): Patient will improved Berg Balance Scale by 7 points to meed MCD.  Skilled Therapeutic Interventions/Progress Updates:     Pt presents supine in bed with grandson at bedside. Pt does not appear to be in any pain, aphasia limiting communication throughout session. Pt awake but initially refusing therapy, benefiting from max encouragement to participate even with grandson encouraging. Pt able to complete supine<>sit with modA with trunk support and BLE management. Able to sit EOB with SBA while unsupported. Donned shoes and R solid AFO with totalA for time. Completed squat<>pivot transfer with mod/maxA from EOB to w/c, towards her stronger L side. Required assist for repositioning in w/c. Transported to main rehab gym to focus remainder of session on NMR for sit<>stand transfers, pre-gait training, and dynamic standing balance. Required modA for squat pivot transfer to mat table. Used Comptroller for visual feedback to assist with sit<>stands. Able to progress from modA to minA by 5th repetition of standing. Required minA for static standing balance while having LUE support to back of arm chair. Pre-gait tasks for forward/backward stepping on L and R with min/modA overall. Able to progress to gait training in hallway with L hand rail and +2 assist for w/c follow for safety. Ambulated ~40ft with modA, primarily for  lateral weight shifting and RLE advancement. Pt returned to room at conclusion of session, remained seated in w/c with safety belt alarm on, all needs within reach. Pt with flat affect throughout treatment session.  Therapy Documentation Precautions:  Precautions Precautions: Fall, Other (comment) Precaution Comments: Baseline R-side hemiplegia; global aphasia Required Braces or Orthoses: Other Brace, Splint/Cast Splint/Cast: Baseline RUE resting hand splint Other Brace: Baseline R AFO for ambulation Restrictions Weight Bearing Restrictions: No General:    Therapy/Group: Individual Therapy  Alger Simons 10/05/2021, 7:31 AM

## 2021-10-05 NOTE — Progress Notes (Signed)
PROGRESS NOTE   Subjective/Complaints: Anxious to go home Compares depressed compared to her usual smiling baseline Doing better with continence Discussed her daily INR check   ROS: Unable to assess due to aphasia   Objective:   No results found. Recent Labs    10/04/21 0521  WBC 7.1  HGB 9.7*  HCT 28.3*  PLT 361   No results for input(s): NA, K, CL, CO2, GLUCOSE, BUN, CREATININE, CALCIUM in the last 72 hours.   Intake/Output Summary (Last 24 hours) at 10/05/2021 1204 Last data filed at 10/04/2021 1900 Gross per 24 hour  Intake 120 ml  Output --  Net 120 ml        Physical Exam: Vital Signs Blood pressure 116/64, pulse 81, temperature 98.8 F (37.1 C), resp. rate 18, SpO2 99 %.   General: asleep- woke briefly; aphasic; NAD HENT: conjugate gaze; oropharynx moist CV: regular rate; no JVD Pulmonary: CTA B/L; no W/R/R- good air movement GI: soft, NT, ND, (+)BS Psychiatric: appropriate; very flat affect Neurological: aphasic; sleepy; mods head yes and no appropriately.  Skin: intact    Mental Status: She is alert.     Comments: Pt is alert. Follows simple basic commands. Nods head "yes/no" to simple questions. Exp>rec aphasia. Did not engage much however during exam due to mood. RUE tr/5. RLE 1-2/5 prox to 0-trace distally (poor effort). Moves left arm and leg freely. Decreased pain sense Right arm and leg. Does have resting flexor tone right wrist and elbow.   GU: improving continence      Assessment/Plan: 1. Functional deficits which require 3+ hours per day of interdisciplinary therapy in a comprehensive inpatient rehab setting. Physiatrist is providing close team supervision and 24 hour management of active medical problems listed below. Physiatrist and rehab team continue to assess barriers to discharge/monitor patient progress toward functional and medical goals  Care Tool:  Bathing    Body parts  bathed by patient: Face, Left upper leg   Body parts bathed by helper: Buttocks, Front perineal area, Left lower leg, Right lower leg, Face, Abdomen, Chest, Right arm, Left arm     Bathing assist Assist Level: Total Assistance - Patient < 25%     Upper Body Dressing/Undressing Upper body dressing   What is the patient wearing?: Hospital gown only, Pull over shirt    Upper body assist Assist Level: Maximal Assistance - Patient 25 - 49%    Lower Body Dressing/Undressing Lower body dressing      What is the patient wearing?: Pants, Incontinence brief     Lower body assist Assist for lower body dressing: 2 Helpers     Toileting Toileting    Toileting assist Assist for toileting: 2 Helpers     Transfers Chair/bed transfer  Transfers assist     Chair/bed transfer assist level: Maximal Assistance - Patient 25 - 49%     Locomotion Ambulation   Ambulation assist   Ambulation activity did not occur: Safety/medical concerns          Walk 10 feet activity   Assist  Walk 10 feet activity did not occur: Safety/medical concerns        Walk 50 feet  activity   Assist Walk 50 feet with 2 turns activity did not occur: Safety/medical concerns         Walk 150 feet activity   Assist Walk 150 feet activity did not occur: Safety/medical concerns         Walk 10 feet on uneven surface  activity   Assist Walk 10 feet on uneven surfaces activity did not occur: Safety/medical concerns         Wheelchair     Assist Is the patient using a wheelchair?: Yes Type of Wheelchair: Manual    Wheelchair assist level: Minimal Assistance - Patient > 75% Max wheelchair distance: 150    Wheelchair 50 feet with 2 turns activity    Assist        Assist Level: Minimal Assistance - Patient > 75%   Wheelchair 150 feet activity     Assist      Assist Level: Minimal Assistance - Patient > 75%   Blood pressure 116/64, pulse 81, temperature  98.8 F (37.1 C), resp. rate 18, SpO2 99 %.  Medical Problem List and Plan: 1. 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 She'll need some motivation at times to participate  Start B/C vitamin complex  Continue CIR- PT, OT and SLP  I have rescheduled her outpatient Botox for February.  2.  Antithrombotics: -DVT/anticoagulation:  INR 2.5 today, warfarin 2mg  today  1/2- INR 2.3             -antiplatelet therapy: Aspirin 81 mg daily 3. Pain Management: Tylenol 4. Situational depression: LCSW to evaluate and provide emotional support             -antipsychotic agents: N/A             -daughter supportive. Pt is unhappy about still being in the hospital. Will need encouragement and positive reinforcement from team. Discuss with daughter whether she may benefit from antidepressant  1/2- Will try Celexa 20 mg daily. Very flat affect 5. Neuropsych: This patient is not capable of making decisions on her own behalf. 6. Skin/Wound Care: Routine skin care checks 7. Fluids/Electrolytes/Nutrition: Ins and outs and follow-up chemistries. 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  Pending hypercoagulable labs, primary care provider could consider transitioning back to NOAC if the work-up is negative.  Consider hematology referral as outpatient. 9.  Atrial fibrillation with RVR: amlodipine held. Diltiazem infusion weaned. HE improved -- continue metoprolol 50 mg BID -HR elevated to 150 this morning with PT. Observe for patterns.  -consider resuming amlodipine (home med) bp permitting -magnesium low but will not supplement given AKI 10: Klebsiella positive, multifocal pyelonephritis with abscess: Infectious disease recommend transition from intravenous cefazolin to cefadroxil twice a  day on discharge to complete a 4-week treatment course.  Stop date of the 10/20/2021. 11: Possible seizure activity:  --continue Keppra 12: Hypertension: stable --metoprolol 50 mg BID -- Resume home amlodipine as needed  1/2- BP 120/93 this AM- will con't regimen for now and monitor 13: AKI: baseline Scr> 0.70. 1.74 on 12/26,  1.32 on 12/28. Monitor trend. 1/2- Cr 1.29 yesterday-  14.  Mild anemia: No indication for transfusion.  Monitor H&H.  1/2- Hb dropped slightly to  9.7 from 10.3 15. Suboptimal vitamin D: start ergocalciferol 50,000U once per week for 7 weeks. 16. Urinary incontinence: improving, continue bladder program    LOS: 4 days A FACE TO FACE EVALUATION WAS PERFORMED  Lindsey [name] 10/05/2021, 12:04 PM

## 2021-10-05 NOTE — Progress Notes (Signed)
Patient ID: Lindsey [name], female   DOB: June 30, 1956, 66 y.o.   MRN: [id number] Follow up with the patient regarding rehab stay, co-morbidities and plan of care. Patient is anxious to go home. She reports daughter Lindsey [name] will be the main caregiver. Patient reportedly is doing well with strategies taught for ADLs and is doing better with continence; prompted toileting.  Reviewed medications, and secondary risks including HTN, coumadin, lipitor and dietary modifications recommended. Handouts left at the beside for family. Continue to follow along to discharge to address educational needs. Collaborate with the team to facilitate preparation for discharge. Pamelia Hoit

## 2021-10-05 NOTE — Progress Notes (Signed)
ANTICOAGULATION CONSULT NOTE - Follow Up Consult  Pharmacy Consult for Warfarin Indication:  Renal artery thrombosis , hx afib, possible antiphospholipid antibody syndrome  No Known Allergies  Patient Measurements: Heparin Dosing Weight: 66 kg   Vital Signs: Temp: 98.8 F (37.1 C) (01/03 0318) BP: 116/64 (01/03 0318) Pulse Rate: 81 (01/03 0318)  Labs: Recent Labs    10/03/21 0700 10/04/21 0521 10/05/21 0531  HGB  --  9.7*  --   HCT  --  28.3*  --   PLT  --  361  --   LABPROT 27.0* 25.0* 33.8*  INR 2.5* 2.3* 3.3*    Estimated Creatinine Clearance: 40.9 mL/min (A) (by C-G formula based on SCr of 1.29 mg/dL (H)).   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 Pt initially on heparin infusion, now to transition to warfarin with enoxaparin bridge given new clot while on apixaban.  Patient also undergoing evaluation for possible antiphospholipid antibody syndrome.  Lovenox now stopped  INR up to 3.3 today   Goal of Therapy:  INR 2-3 Monitor platelets by anticoagulation protocol: Yes   Plan:  No warfarin today Daily INR  Thank you Anette Guarneri, PharmD  10/05/2021 8:18 AM  **Pharmacist phone directory can be found on [email or web address] listed under [place]**

## 2021-10-05 NOTE — Progress Notes (Signed)
Physical Therapy Session Note  Patient Details  Name: Lindsey [name] MRN: [id number] Date of Birth: 04/22/1956  Today's Date: 10/05/2021 PT Individual Time: 0901-0954 PT Individual Time Calculation (min): 53 min   Short Term Goals: Week 1:  PT Short Term Goal 1 (Week 1): Patient will perform bed mobility with min A >50% of the time without use of hospital bed features. PT Short Term Goal 2 (Week 1): Patient will perform basic transfers with min A >50% of the time using LRAD. PT Short Term Goal 3 (Week 1): Patient will ambulate >50 feet with min A using LRAD. PT Short Term Goal 4 (Week 1): Patient will improved Berg Balance Scale by 7 points to meed MCD.  Skilled Therapeutic Interventions/Progress Updates:  Pt received supine in bed, slouched over to L side. Pt seemingly frustrated by expressive aphasia, but shook her head "no" when asked about pain. Emphasis of session on R NMR, transfers and gait training. Pt reported urge to use bathroom, donned socks w/ total A in supine and pt performed supine <>sit EOB w/min A for trunk support. Pt required min A to maintain static sitting balance at EOB due to posterolateral lean to L side. Sit <> stand w/min A and pt transported to toilet via [place]. Stand <> sit on [facility] w/mod A for eccentric control and pt had continent BM, noted in flowsheets. Sit <>stand from [facility] to [facility] w/mod A due to low height and performed peri care w/total A. Donned clean brief w/total A, requiring multiple sit <>stands w/min A due to pt fatigue. Pt refused to don pants and was transferred to [facility] via [facility], stand <>sit w/max A due to fatigue and poor eccentric control. Pt began to grimace as if she were in pain and pointed to R knee, unable to obtain quantity for pain and pt declined pain medication and modalities. Remainder of session spent on RLE strength, as pt repeatedly shook her head "no" and refused to look at therapist. The following were performed w/max encouragement, mild  agitation from pt:  -LAQ x10 on RLE, able to achieve 40% full ROM  -Seated march x6 on RLE, able to achieve 20% full ROM  -Ankle DF x3, pt able to initiate DF (1/5 MMT) but became frustrated and refused further exercises   Pt was left seated in WC in room, towel draped over lap and RUE supported on tray, all needs in reach.   Therapy Documentation Precautions:  Precautions Precautions: Fall, Other (comment) Precaution Comments: Baseline R-side hemiplegia; global aphasia Required Braces or Orthoses: Other Brace, Splint/Cast Splint/Cast: Baseline RUE resting hand splint Other Brace: Baseline R AFO for ambulation Restrictions Weight Bearing Restrictions: No   Therapy/Group: Individual Therapy Cruzita Lederer Jean Skow, PT, DPT  10/05/2021, 7:46 AM

## 2021-10-05 NOTE — Progress Notes (Addendum)
Occupational Therapy Session Note  Patient Details  Name: Lindsey [name] MRN: [id number] Date of Birth: 1956/01/05  Today's Date: 10/05/2021 OT Individual Time: 1000-1100 OT Individual Time Calculation (min): 60 min    Short Term Goals: Week 1:  OT Short Term Goal 1 (Week 1): Pt will complete sit to stand with no lift and max A in prep for standing ADL. OT Short Term Goal 2 (Week 1): Pt will complete grooming task at sink with no more than min VCs and S. OT Short Term Goal 3 (Week 1): Pt will don shirt with max A. OT Short Term Goal 4 (Week 1): Pt will bathe UB with mod A.   Skilled Therapeutic Interventions/Progress Updates:    Pt sitting in w/c, appearing somewhat flat and apprehensive to participate with OT, but agreeable with frequent encouragment to complete sinkside oral hygiene and UB/LB dressing.  Shaking head "no" throughout session when asked if in any pain.  Pt intermittently discontinuing tasks throughout session needing multimodal cues to stay on task and sequence through.  Pt required min assist to apply toothpaste to toothbrush and turn on water mainly secondary to lack of initiation but then pt finished task without assist thereafter.  Pt doffed shirt with min assist and self initiating hemi technique (again needing cues to complete task due to stopping halfway through).  Pt nodding head indicating desire to doff current briefs provided by hospital and donn personal pull up briefs.  Pt doffed/doff brief with mod assist.  Donned pants with mod assist as well.  Upon standing to pull brief and pants up, pt abruptly sitting back in w/c without warning and needing mod assist to control descent.  Pt also needing mod assist for power up and initiating sit to stand to complete LB dressing.  Pt up in w/c, RUE supported with half lap tray, call bell in reach, seat alarm on.  Pt appearing apathetic towards participating during OT today, which is limiting her performance and independence during  basic self care tasks.    Therapy Documentation Precautions:  Precautions Precautions: Fall, Other (comment) Precaution Comments: Baseline R-side hemiplegia; global aphasia Required Braces or Orthoses: Other Brace, Splint/Cast Splint/Cast: Baseline RUE resting hand splint Other Brace: Baseline R AFO for ambulation Restrictions Weight Bearing Restrictions: No    Therapy/Group: Individual Therapy  Ezekiel Slocumb 10/05/2021, 12:01 PM

## 2021-10-06 LAB — BASIC METABOLIC PANEL
Anion gap: 11 (ref 5–15)
BUN: 9 mg/dL (ref 8–23)
CO2: 21 mmol/L — ABNORMAL LOW (ref 22–32)
Calcium: 8.7 mg/dL — ABNORMAL LOW (ref 8.9–10.3)
Chloride: 102 mmol/L (ref 98–111)
Creatinine, Ser: 1.08 mg/dL — ABNORMAL HIGH (ref 0.44–1.00)
GFR, Estimated: 57 mL/min — ABNORMAL LOW (ref >60)
Glucose, Bld: 96 mg/dL (ref 70–99)
Potassium: 4 mmol/L (ref 3.5–5.1)
Sodium: 134 mmol/L — ABNORMAL LOW (ref 135–145)

## 2021-10-06 LAB — CBC
HCT: 27.5 % — ABNORMAL LOW (ref 36.0–46.0)
Hemoglobin: 9.1 g/dL — ABNORMAL LOW (ref 12.0–15.0)
MCH: 28.4 pg (ref 26.0–34.0)
MCHC: 33.1 g/dL (ref 30.0–36.0)
MCV: 85.9 fL (ref 80.0–100.0)
Platelets: 462 10*3/uL — ABNORMAL HIGH (ref 150–400)
RBC: 3.2 MIL/uL — ABNORMAL LOW (ref 3.87–5.11)
RDW: 15.5 % (ref 11.5–15.5)
WBC: 5.1 10*3/uL (ref 4.0–10.5)
nRBC: 0 % (ref 0.0–0.2)

## 2021-10-06 LAB — PROTIME-INR
INR: 2.9 — ABNORMAL HIGH (ref 0.8–1.2)
Prothrombin Time: 29.9 seconds — ABNORMAL HIGH (ref 11.4–15.2)

## 2021-10-06 MED ORDER — WARFARIN 0.5 MG HALF TABLET
0.5000 mg | ORAL_TABLET | Freq: Once | ORAL | Status: AC
Start: 2021-10-06 — End: 2021-10-06
  Administered 2021-10-06: 0.5 mg via ORAL
  Filled 2021-10-06: qty 1

## 2021-10-06 NOTE — Progress Notes (Signed)
Patient moved from [facility] to [place].

## 2021-10-06 NOTE — Progress Notes (Signed)
Physical Therapy Session Note  Patient Details  Name: Lindsey [name] MRN: [id number] Date of Birth: [birth year]  Today's Date: 10/06/2021 PT Individual Time: 1000-1055  PT Individual Time Calculation (min): 55 min    Short Term Goals: Week 1:  PT Short Term Goal 1 (Week 1): Patient will perform bed mobility with min A >50% of the time without use of hospital bed features. PT Short Term Goal 2 (Week 1): Patient will perform basic transfers with min A >50% of the time using LRAD. PT Short Term Goal 3 (Week 1): Patient will ambulate >50 feet with min A using LRAD. PT Short Term Goal 4 (Week 1): Patient will improved Berg Balance Scale by 7 points to meed MCD.  Skilled Therapeutic Interventions/Progress Updates:      1st session: Pt presents supine in bed with her daughter at bedside. Lengthy conversation with daughter regarding PT Goals, PT POC, pt's current mobility status, and obtaining PLOF and social factors related to DC planning. Daughter confirms 2 lvl home with chair lift to 2nd floor for bathroom access; hospital bed on main floor with ramped entrance into home. Reports w/c accessible home but pt was ambulating with cane with CGA PTA. Daughter reports she has 2 teenage sons at home but she works 3rd shift, therefore will not have nighttime assistance. Discussed barriers to therapy with pt's lack of motivation and being apathetic towards participation and all therapy disciplines. Daughter provided encouragement to patient but pt continues to shake her head 'no' when this was offered. Able to convince her to participate in therapy session. Required totalA for LB dressing at bed level (able to roll in bed towards her R with minA and bed rail, requires totalA for rolling L). Supine<>sit with modA for BLE and trunk support. Able to sit EOB with SBA while completing UB dressing where she required mod/maxA. Donned shoes and R solid AFO for time management. Completed squat<>pivot transfer with modA from  EOB to w/c with facilitation for forward weight shift, lateral scooting, and placement of feet. Pt transported to main rehab gym for time. Pt assisted to mat table in similar manner as above. Retrieved wide based QC to work on pre-gait and gait training with familiar AD. Pt shaking head 'no' when instructed to stand - when she did attempt to stand, limited effort noted. Offered alternative therapeutic interventions but she continued to refuse and shake head 'no'. Pt assisted back to her w/c with modA stransfer and returned to her room where she remained seated in w/c with safety belt alarm on and all her immediate needs within reach.   2nd session: Pt supine in bed sleeping. She awakens to voice but then shuts her eyes. She shakes her head 'no' to therapy. Unable to convince and encourage her to participate. She missed 30 minutes of skilled therapy. Will re-attempt as schedule allows.   Therapy Documentation Precautions:  Precautions Precautions: Fall, Other (comment) Precaution Comments: Baseline R-side hemiplegia; global aphasia Required Braces or Orthoses: Other Brace, Splint/Cast Splint/Cast: Baseline RUE resting hand splint Other Brace: Baseline R AFO for ambulation Restrictions Weight Bearing Restrictions: No General: PT Amount of Missed Time (min): 30 Minutes PT Missed Treatment Reason: Patient unwilling to participate;Patient fatigue  Therapy/Group: Individual Therapy  Alger Simons 10/06/2021, 7:47 AM

## 2021-10-06 NOTE — Progress Notes (Signed)
Speech Language Pathology Daily Session Note  Patient Details  Name: Lindsey [name] MRN: [id number] Date of Birth: 07-Nov-1955  Today's Date: 10/06/2021 SLP Individual Time: [id number] SLP Individual Time Calculation (min): 55 min  Short Term Goals: Week 1: SLP Short Term Goal 1 (Week 1): Patient will consume current diet with minimal overt s/s of aspiration with Min verbal cues for use of swallowing compensatory strategies. SLP Short Term Goal 2 (Week 1): Patient will demonstrate selective attention to a task in a mildly distracting enviornment for 20 minutes with Min verbal cues for redirection. SLP Short Term Goal 3 (Week 1): Patient will utilize multimodal communication to express wants/needs with Mod A multimodal cues. SLP Short Term Goal 4 (Week 1): Patient will answer mildly abstract yes/no questions with 75% accuracy and Mod verbal cues. SLP Short Term Goal 5 (Week 1): Patient will produce 3 words that are personally relevant/significant with 50% accuracy and Mod A multimodal cues.  Skilled Therapeutic Interventions: Skilled treatment session focused on dysphagia and cognitive goals. SLP facilitated session by providing yes/no questions regarding wants/needs during tray set-up. Patient declined eating and all other options provided with the exception of eating one bite of pound cake that family provided. Patient participated in a basic language task that focused on error awareness while verbalizing names of important family members. Patient able to produce all names accurately with Mod A verbal and visual cues with Max verbal and visual cues needed to minimize perseveration. Patient's daughter present towards end of session and eventually encouraged patient to eat ~25% of her meal. Patient left upright in bed with alarm on and all needs within reach. Continue with current plan of care.      Pain No/Denies Pain   Therapy/Group: Individual Therapy  Shavette Shoaff 10/06/2021, 3:09 PM

## 2021-10-06 NOTE — Progress Notes (Signed)
ANTICOAGULATION CONSULT NOTE - Follow Up Consult  Pharmacy Consult for Warfarin Indication:  Renal artery thrombosis , hx afib, possible antiphospholipid antibody syndrome  No Known Allergies  Patient Measurements: Heparin Dosing Weight: 66 kg   Vital Signs: Temp: 98.3 F (36.8 C) (01/04 0430) Temp Source: Oral (01/04 0430) BP: 123/90 (01/04 0430) Pulse Rate: 66 (01/04 0430)  Labs: Recent Labs    10/04/21 0521 10/05/21 0531 10/06/21 0042 10/06/21 0542  HGB 9.7*  --  9.1*  --   HCT 28.3*  --  27.5*  --   PLT 361  --  462*  --   LABPROT 25.0* 33.8*  --  29.9*  INR 2.3* 3.3*  --  2.9*  CREATININE  --   --  1.08*  --     Estimated Creatinine Clearance: 48.9 mL/min (A) (by C-G formula based on SCr of 1.08 mg/dL (H)).   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 Pt initially on heparin infusion, now to transition to warfarin with enoxaparin bridge given new clot while on apixaban.  Patient also undergoing evaluation for possible antiphospholipid antibody syndrome.  Lovenox now stopped  INR 2.9 today   Goal of Therapy:  INR 2-3 Monitor platelets by anticoagulation protocol: Yes   Plan:  Warfarin 0.5 mg po x 1 Daily INR   Thank you Anette Guarneri, PharmD  10/06/2021 8:17 AM  **Pharmacist phone directory can be found on [email or web address] listed under [place]**

## 2021-10-06 NOTE — Progress Notes (Signed)
Patient ID: Chelsey Redondo, female   DOB: 1956/08/16, 66 y.o.   MRN: [id number]  Met with pt and daughter who is present in her room to update regarding team conference goals of min assist level and target discharge date of 1/13. Pt is quite frustrated with length of stay and having to start over with her recovery. Daughter is very encouraging to pt and is pushing her to work hard in therapies, will ask whole team including myself to encourage pt. Discussed with daughter and pt OP which they want again will make referral now so there is no gap in service once discharged from here. Has all equipment from past admission. Will provide daily encouragement and ask neuro-psych to see while here.

## 2021-10-06 NOTE — Patient Care Conference (Signed)
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 Leeroy Cha Social Worker Present: Ovidio Kin, LCSW Nurse Present: Dorien Chihuahua, RN PT Present: Ginnie Smart, PT OT Present: Clyda Greener, OT SLP Present: Lillie Columbia, SLP PPS Coordinator present : Gunnar Fusi, SLP     Current Status/Progress Goal Weekly Team Focus  Bowel/Bladder   incontinent of b/b. LBM 1/3  Regain continence  Toilet Q2-3hr.   Swallow/Nutrition/ Hydration   Dys 3/thin with supervision  regular/thin Supervision  regular trials, use of strategies   ADL's   mod assist UB/LB bathing, dressing, and toileting, min assist oral hygiene, mod assist functional transfers; apathetic towards therapy interventions.  min assist  self care training, functional transfer training, standing endurance and balance, sequencing and initation training   Mobility   maxA bed mobility, minA sitting balance, mod/maxA stand<>pivot transfers, maxA for taking a few steps within // bars. Very flat affect and ?depressed with limited motivation  minA short distance gait, supervision w/c mobility  bed mobility, dynamic sitting balance, functional transfers, pre-gait and gait training as able   Communication   max A expressive/receptive  mod A   communicating functional needs, yes/no questions   Safety/Cognition/ Behavioral Observations  mod A sustained attention  min A  sustained attention to functional tasks/language tasks   Pain   no c/o pain  Pain <3/10  Assess Qshift and prn   Skin   Skin intact  Maintain skin integrity  Assess Qshift and prn     Discharge Planning:  Pt had just recvoered from past CVA and was ambulating with cane when this happened. She is depressed having to start all over again. Plan home with daughter and grandson-18 yo unsure if can provide 24/7 care due to daughter works and used her leave with pt's other CVA   Team Discussion: Patient appears depressed; dis-engaged, apathetic with poor participation in therapies and declined medication for depression. Anxious to go home.  Patient on target to meet rehab goals: Currently needs mod - max assist for squat pivot transfers and mod assist for sit- stand. Needs mod asist for upper body bathing and dressing and toileting. Patient is currently incontinent of bowel and bladder with toileting protocol in place. Needs max assist for communication ; cues to maintain attention to tasks. Goals for discharge set for min assist overall with mod assist for communication.  *See Care Plan and progress notes for long and short-term goals.   Revisions to Treatment Plan:  Upgrade diet to reg thin for discharge is goal for SLP   Teaching Needs: Safety, transfers, toileting, medication management, secondary risk management, etc  Current Barriers to Discharge: Decreased caregiver support and Behavior  Possible Resolutions to Barriers: Family education with daughter     Medical Summary Current Status: situational depression, aphasia,  cerebral infarction due to occlusion or stenosis of left cerebral artery, vitals stable, post-stroke spasticity  Barriers to Discharge: Medical stability  Barriers to Discharge Comments: situational depression, aphasia, cerebral infarction  due to occlusion or stenosis of left cerebral artery, vitals stable, post-stroke spasticity Possible Resolutions to [employer] Focus: discussed antidepressant, role of exercise in improving mood, continue to monitor vitals, continue to monitor her spasticity, continue INR checks and warfarin dosing   Continued Need for Acute Rehabilitation Level of Care: The patient requires daily medical management by a physician with specialized training in physical medicine and rehabilitation for the following reasons: Direction of a multidisciplinary physical rehabilitation program to maximize functional independence : Yes Medical management of patient stability for increased activity during participation in an intensive rehabilitation regime.: Yes Analysis of laboratory values and/or radiology reports with any subsequent need for medication adjustment and/or medical intervention. : Yes   I attest that I was present, lead the team conference, and concur with the assessment and plan of the team.   Dorien Chihuahua B 10/06/2021, 12:04 PM

## 2021-10-06 NOTE — Progress Notes (Signed)
Physical Therapy Session Note  Patient Details  Name: Lindsey [name] MRN: [id number] Date of Birth: 1956-01-22  Today's Date: 10/06/2021 PT Individual Time: 1130-1215 PT Individual Time Calculation (min): 45 min   Short Term Goals: Week 1:  PT Short Term Goal 1 (Week 1): Patient will perform bed mobility with min A >50% of the time without use of hospital bed features. PT Short Term Goal 2 (Week 1): Patient will perform basic transfers with min A >50% of the time using LRAD. PT Short Term Goal 3 (Week 1): Patient will ambulate >50 feet with min A using LRAD. PT Short Term Goal 4 (Week 1): Patient will improved Berg Balance Scale by 7 points to meed MCD.  Skilled Therapeutic Interventions/Progress Updates: Pt presented in w/c with dgt present initially shaking head no to participation to therapy. With help from dgt, pt agreeable to participate. Pt transported to rehab gym and participated in pre-gait activities in parallel bars with pt performed Sit to stand with light minA. Pt performed weight shifting L/R then progressing to forward/backward steps with RLE then LLE. Pt required facilitation to shift weight to R to allow advancement of LLE. Pt then progressed to forward ambulation in parallel bars ~67ft with minA and light facilitation to R to advance LLE. Pt then refusing to stand when attempting again therefore taken to day room and participated in Cybex Kinetron 70cm/sec 4 bouts of 30 sec for forced use RLE and reciprocal activity. Pt with increased time between bouts and significant encouragement to complete tasks. Pt then transported back to room and dgt requesting if pt can be taken to bathroom as has not used bathroom since dgt's arrival. Performed Stedy transfer with MinA initially due to placement of Stedy and transferred to toilet. Total A for LB clothing management (+BM). PTA performed peri-care total A once completed and pulled pants/brief over hips total A. Pt returned to w/c at end of  session with lap tray placed, alarm belt in place, call bell within reach, and splint placed on pt's R hand.      Therapy Documentation Precautions:  Precautions Precautions: Fall, Other (comment) Precaution Comments: Baseline R-side hemiplegia; global aphasia Required Braces or Orthoses: Other Brace, Splint/Cast Splint/Cast: Baseline RUE resting hand splint Other Brace: Baseline R AFO for ambulation Restrictions Weight Bearing Restrictions: No General:   Vital Signs:   Pain:   Mobility:   Locomotion :    Trunk/Postural Assessment :    Balance:   Exercises:   Other Treatments:      Therapy/Group: Individual Therapy  Anaid Haney 10/06/2021, 12:51 PM

## 2021-10-06 NOTE — Progress Notes (Addendum)
PROGRESS NOTE   Subjective/Complaints: Very frustrated regarding her deficits Daughter asks why her speech has worsened  Team conference held today.  INR 2.9   ROS: unable to assess due to aphasia   Objective:   No results found. Recent Labs    10/04/21 0521 10/06/21 0042  WBC 7.1 5.1  HGB 9.7* 9.1*  HCT 28.3* 27.5*  PLT 361 462*   Recent Labs    10/06/21 0042  NA 134*  K 4.0  CL 102  CO2 21*  GLUCOSE 96  BUN 9  CREATININE 1.08*  CALCIUM 8.7*     Intake/Output Summary (Last 24 hours) at 10/06/2021 1110 Last data filed at 10/05/2021 1721 Gross per 24 hour  Intake 15 ml  Output --  Net 15 ml        Physical Exam: Vital Signs Blood pressure 123/90, pulse 66, temperature 98.3 F (36.8 C), temperature source Oral, resp. rate 18, SpO2 100 %. Gen: no distress, normal appearing HEENT: oral mucosa pink and moist, NCAT Cardio: Reg rate Chest: normal effort, normal rate of breathing Abd: soft, non-distended Ext: no edema Psych: very flat affect Neurological: aphasic; sleepy; mods head yes and no appropriately.  Skin: intact    Mental Status: She is alert.     Comments: Pt is alert. Follows simple basic commands. Nods head "yes/no" to simple questions. Exp>rec aphasia. Did not engage much however during exam due to mood. RUE tr/5. RLE 1-2/5 prox to 0-trace distally (poor effort). Moves left arm and leg freely. Decreased pain sense Right arm and leg. Does have resting flexor tone right wrist and elbow.   GU: improving continence      Assessment/Plan: 1. Functional deficits which require 3+ hours per day of interdisciplinary therapy in a comprehensive inpatient rehab setting. Physiatrist is providing close team supervision and 24 hour management of active medical problems listed below. Physiatrist and rehab team continue to assess barriers to discharge/monitor patient progress toward functional and medical  goals  Care Tool:  Bathing    Body parts bathed by patient: Face, Left upper leg   Body parts bathed by helper: Buttocks, Front perineal area, Left lower leg, Right lower leg, Face, Abdomen, Chest, Right arm, Left arm     Bathing assist Assist Level: Total Assistance - Patient < 25%     Upper Body Dressing/Undressing Upper body dressing   What is the patient wearing?: Hospital gown only, Pull over shirt    Upper body assist Assist Level: Maximal Assistance - Patient 25 - 49%    Lower Body Dressing/Undressing Lower body dressing      What is the patient wearing?: Pants, Incontinence brief     Lower body assist Assist for lower body dressing: 2 Helpers     Toileting Toileting    Toileting assist Assist for toileting: 2 Helpers     Transfers Chair/bed transfer  Transfers assist     Chair/bed transfer assist level: Maximal Assistance - Patient 25 - 49%     Locomotion Ambulation   Ambulation assist   Ambulation activity did not occur: Safety/medical concerns          Walk 10 feet activity   Assist  Walk 10 feet activity did not occur: Safety/medical concerns        Walk 50 feet activity   Assist Walk 50 feet with 2 turns activity did not occur: Safety/medical concerns         Walk 150 feet activity   Assist Walk 150 feet activity did not occur: Safety/medical concerns         Walk 10 feet on uneven surface  activity   Assist Walk 10 feet on uneven surfaces activity did not occur: Safety/medical concerns         Wheelchair     Assist Is the patient using a wheelchair?: Yes Type of Wheelchair: Manual    Wheelchair assist level: Minimal Assistance - Patient > 75% Max wheelchair distance: 150    Wheelchair 50 feet with 2 turns activity    Assist        Assist Level: Minimal Assistance - Patient > 75%   Wheelchair 150 feet activity     Assist      Assist Level: Minimal Assistance - Patient > 75%    Blood pressure 123/90, pulse 66, temperature 98.3 F (36.8 C), temperature source Oral, resp. rate 18, SpO2 100 %.  Medical Problem List and Plan: 1. 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 She'll need some motivation at times to participate  Start B/C vitamin complex  Continue CIR- PT, OT and SLP  I have rescheduled her outpatient Botox for February.  2.  Antithrombotics: -DVT/anticoagulation:  INR 2.5 today, warfarin 0.5mg  today  INR 2.9, discussed with patient and daughter.              -antiplatelet therapy: Aspirin 81 mg daily 3. Pain Management: Tylenol 4. Situational depression: LCSW to evaluate and provide emotional support             -antipsychotic agents: N/A             -daughter supportive. Pt is unhappy about still being in the hospital. Will need encouragement and positive reinforcement from team. Discuss with daughter whether she may benefit from antidepressant. She does not want an anti-depressant so I will stop the Celexa that was started for her 5. Neuropsych: This patient is not capable of making decisions on her own behalf. 6. Skin/Wound Care: Routine skin care checks 7. Fluids/Electrolytes/Nutrition: Ins and outs and follow-up chemistries. 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  Pending hypercoagulable labs, primary care provider could consider transitioning back to NOAC if the work-up is negative.  Consider hematology referral as outpatient. 9.  Atrial fibrillation with RVR: amlodipine held. Diltiazem infusion weaned. HE improved -- continue metoprolol 50 mg BID -HR elevated to 150 this morning with PT. Observe for patterns.  -consider resuming amlodipine (home med) bp permitting -magnesium low but will not supplement given AKI 10:  Klebsiella positive, multifocal pyelonephritis with abscess: Infectious disease recommend transition from intravenous cefazolin to cefadroxil twice a day on discharge to complete a 4-week treatment course.  Stop date of the 10/20/2021. 11: Possible seizure activity:  --continue Keppra 12: Hypertension: stable --metoprolol 50 mg BID -- Resume home amlodipine as needed  1/2- BP 120/93 this AM- will con't regimen for now and monitor 13: AKI: baseline  Scr> 0.70. 1.74 on 12/26,  1.32 on 12/28. Monitor trend. 1/2- Cr 1.29 yesterday-  14.  Mild anemia: No indication for transfusion.  Monitor H&H.  1/2- Hb dropped slightly to 9.7 from 10.3 15. Suboptimal vitamin D: start ergocalciferol 50,000U once per week for 7 weeks. 16. Urinary incontinence: improving, continue bladder program    LOS: 5 days A FACE TO FACE EVALUATION WAS PERFORMED  Nedda Gains P Sayla Golonka 10/06/2021, 11:10 AM

## 2021-10-06 NOTE — Progress Notes (Signed)
Occupational Therapy Session Note  Patient Details  Name: Lindsey [name] MRN: [id number] Date of Birth: 1956/03/25  Today's Date: 10/06/2021 OT Individual Time: [id number] OT Individual Time Calculation (min): 40 min  and Today's Date: 10/06/2021 OT Missed Time: 20 Minutes Missed Time Reason: Patient fatigue;Patient unwilling/refused to participate without medical reason   Short Term Goals: Week 1:  OT Short Term Goal 1 (Week 1): Pt will complete sit to stand with no lift and max A in prep for standing ADL. OT Short Term Goal 2 (Week 1): Pt will complete grooming task at sink with no more than min VCs and S. OT Short Term Goal 3 (Week 1): Pt will don shirt with max A. OT Short Term Goal 4 (Week 1): Pt will bathe UB with mod A.   Skilled Therapeutic Interventions/Progress Updates:    Pt greeted at time of session bed level, in her new room. Pt with very limited to no verbal communication throughout session, global aphasia limiting. Note pt did during session 2 times smile at appropriate times and said "no" when asked if she wanted something to drink. Otherwise, pt shaking/nodding head throughout session but unclear if accurate. Offered pt numerous tasks, ADL, OOB, out of room activity, and bed level activity with pt shaking head no and attempting to fall back asleep. OT applying lotion to both legs and arms with pt engaging in this activity by lifting LLE/LUE from bed surface when appropriate for therapist to apply lotion to front/back and to doff/don socks. Pt unable to assist and/or activate R side of body to assist but did allow therapist to apply lotion and sensory/tactile input to R side of body to promote return. Doffed RUE resting hand splint and skin in good condition, performed the following 1x10: shoulder flexion/extension, circles, external/internal rotation, elbow flexion/extension (as able 2/2 IV), sup/pro, wrist flexion/extension. Reapplied splint and elevated RUE. Pt declining all other  activity and left resting bed level alarm on call bell in reach. Missed 20 mins of OT 2/2 fatigue and refusal for further OT.  Therapy Documentation Precautions:  Precautions Precautions: Fall, Other (comment) Precaution Comments: Baseline R-side hemiplegia; global aphasia Required Braces or Orthoses: Other Brace, Splint/Cast Splint/Cast: Baseline RUE resting hand splint Other Brace: Baseline R AFO for ambulation Restrictions Weight Bearing Restrictions: No    Therapy/Group: Individual Therapy  Viona Gilmore 10/06/2021, 12:58 PM

## 2021-10-07 LAB — PROTIME-INR
INR: 2.5 — ABNORMAL HIGH (ref 0.8–1.2)
Prothrombin Time: 27.1 seconds — ABNORMAL HIGH (ref 11.4–15.2)

## 2021-10-07 MED ORDER — WARFARIN SODIUM 1 MG PO TABS
1.0000 mg | ORAL_TABLET | Freq: Once | ORAL | Status: AC
Start: 2021-10-07 — End: 2021-10-07
  Administered 2021-10-07: 1 mg via ORAL
  Filled 2021-10-07: qty 1

## 2021-10-07 MED ORDER — METOPROLOL TARTRATE 50 MG PO TABS
62.5000 mg | ORAL_TABLET | Freq: Two times a day (BID) | ORAL | Status: DC
  Administered 2021-10-07 – 2021-10-08 (×2): 62.5 mg via ORAL
  Filled 2021-10-07 (×2): qty 1

## 2021-10-07 NOTE — Progress Notes (Signed)
Patient ID: Lindsey [name], female   DOB: 09/26/1956, 65 y.o.   MRN: [id number]  Met with pt grandson was here visiting and pt seemed to like him being here. Asked how therapies were going she stated she is participating and trying her best. Pt aware of discharge on Friday the Thirteenth, she is aware she needs to get the the highest level before going home. Will continue to see her daily to provide encouragement. °

## 2021-10-07 NOTE — Discharge Summary (Signed)
Physician Discharge Summary  Patient ID: Lindsey BruinColleen [name] MRN: [id number] DOB/AGE: 12-17-1955 66 y.o.  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 FINDINGS: The bowel gas pattern is normal. No radio-opaque calculi or other significant radiographic abnormality are seen. IMPRESSION: Nonobstructive pattern of bowel gas. No obvious free air in the abdomen on single supine radiograph. Electronically Signed   By: Jearld LeschAlex D Bibbey M.D.   On: 09/24/2021 14:20   CT HEAD WO CONTRAST (5MM)  Result Date: 09/23/2021 CLINICAL DATA:  Stroke, possible hemorrhagic conversion. EXAM: CT HEAD WITHOUT CONTRAST TECHNIQUE: Contiguous axial images were obtained from the base of the skull through the vertex without intravenous contrast. COMPARISON:  09/22/2021. FINDINGS: Brain: No acute intracranial hemorrhage, midline shift or mass effect. No extra-axial fluid collection. There is redemonstration of hypodense attenuation and encephalomalacia region in the MCA territory on the left compatible with old infarct. A hypodense region is noted in the left cerebellar hemisphere, not significantly changed from the prior exam. Extensive subcortical and periventricular white matter  hypodensities are noted bilaterally. There is no hydrocephalus. Vascular: Atherosclerotic calcification of the carotid siphons. No hyperdense vessel. Skull: Normal. Negative for fracture or focal lesion. Sinuses/Orbits: There is partial opacification of the ethmoid air cells and maxillary sinuses bilaterally. The orbits are stable. Other: None. IMPRESSION: 1. Hypodense region in the left cerebellar hemisphere, compatible with known acute infarct. No acute hemorrhage is identified. 2. Stable old infarct in the MCA territory on the left. 3. Atrophy with chronic microvascular ischemic changes. Electronically Signed   By: Thornell SartoriusLaura  Taylor M.D.   On: 09/23/2021 02:06   CT ABDOMEN PELVIS W CONTRAST  Result Date: 09/26/2021 CLINICAL DATA:  Nausea vomiting pyelonephritis EXAM: CT ABDOMEN AND PELVIS WITH CONTRAST TECHNIQUE: Multidetector CT imaging of the abdomen and pelvis was performed using the standard protocol following bolus administration of intravenous contrast. CONTRAST:  80mL OMNIPAQUE IOHEXOL 300 MG/ML  SOLN COMPARISON:  MRI 09/19/2021, CT 09/19/2021 FINDINGS: Lower chest: Lung bases demonstrate patchy atelectasis at the bases. No pleural effusion. Cardiomegaly with biatrial enlargement. Hepatobiliary: Multiple gallbladder folds. No calcified stones. No focal hepatic abnormality. No biliary dilatation. Pancreas: Unremarkable. No pancreatic ductal dilatation or surrounding inflammatory changes. Spleen: Normal in size without focal abnormality. Adrenals/Urinary Tract: Adrenal glands are within normal limits. Multifocal hypoenhancement within the right kidney, greatest involving the upper and midpole, consistent with pyelonephritis. More focal hypodense collection at the upper pole measuring 3.1 by 2.7 cm, suspect for phlegmon or renal abscess, this is mildly decreased from the prior CT at which time the seen area measures 3.4 x 3.3 cm. There are several cysts in the left kidney. Interim development of markedly  abnormal appearance of left kidney with multifocal hypoenhancement involving large portion of the upper and midpole of left kidney with relative sparing of the lower pole. Some peripheral cortical enhancement is preserved at the midpole of the left kidney. Left perinephric fat stranding. No hydronephrosis or  hydroureter. The bladder is unremarkable Stomach/Bowel: The stomach is nonenlarged. No dilated small bowel. No acute bowel wall thickening. Negative appendix. Vascular/Lymphatic: Nonaneurysmal aorta with mild atherosclerosis. New thrombus is present within the left main renal artery, series 3 image 32, and coronal series 6 image 36 through 40 on coronal views this is approximately 10 mm distal to the renal artery origin, thrombus extends to upper pole segmental vessels of the kidney. Renal veins appear patent. No suspicious adenopathy. Reproductive: Calcified uterine fibroid.  No adnexal mass. Other: Negative for pelvic effusion or free air. Musculoskeletal: No acute or significant osseous findings. IMPRESSION: 1. Interim development of markedly abnormal appearance of the left kidney with extensive areas of hypoenhancement, relative sparing of the lower pole. New finding of thrombus within the left renal artery. Suspect that the hypoenhancement in the left kidney is largely due to renal infarct given the presence of thrombus in the artery, though there may be associated areas of pyelonephritis as well. 2. Residual patchy hypoenhancement at the right kidney, consistent with pyelonephritis. More focal hypodense collection at the upper pole right kidney is slightly smaller compared to the prior CT and may reflect phlegmon or resolving abscess (favored), with infarct also in the differential. 3. Calcified uterine fibroid Critical Value/emergent results were called by telephone at the time of interpretation on 09/26/2021 at 6:46 pm to provider Dr. Teola Bradley, Who verbally acknowledged these results. Electronically  Signed   By: Jasmine Pang M.D.   On: 09/26/2021 18:48   DG CHEST PORT 1 VIEW  Result Date: 09/25/2021 CLINICAL DATA:  Shortness of breath. EXAM: PORTABLE CHEST 1 VIEW COMPARISON:  Chest radiograph dated 09/19/2021. FINDINGS: Left lung base reticular densities, likely atelectasis. Developing infiltrate is less likely. No focal consolidation, pleural effusion, pneumothorax. Mild cardiomegaly. Atherosclerotic calcification of the aorta. No acute osseous pathology. IMPRESSION: Left lung base atelectasis. Developing infiltrate is less likely. Electronically Signed   By: Elgie Collard M.D.   On: 09/25/2021 20:22   EEG adult  Result Date: 09/23/2021 Charlsie Quest, MD     09/23/2021 11:44 AM Patient Name: Lindsey [name] MRN: [id number] Epilepsy Attending: Charlsie Quest Referring Physician/Provider: Dr Erick Blinks Date: 09/23/2021 Duration: 23.57 mins Patient history: 66 year old female with seizure-like episode.  EEG to evaluate for seizure. Level of alertness: Awake, asleep AEDs during EEG study: LEV Technical aspects: This EEG study was done with scalp electrodes positioned according to the 10-20 International system of electrode placement. Electrical activity was acquired at a sampling rate of 500Hz  and reviewed with a high frequency filter of 70Hz  and a low frequency filter of 1Hz . EEG data were recorded continuously and digitally stored. Description: The posterior dominant rhythm consists of 8 Hz activity of moderate voltage (25-35 uV) seen predominantly in posterior head regions, asymmetric ( left<right) and reactive to eye opening and eye closing. Sleep was characterized by vertex waves, sleep spindles (12 to 14 Hz), maximal frontocentral region.  EEG showed continuous 3 to 5 Hz theta Parenta slowing in left hemisphere.  Hyperventilation and photic stimulation were not performed.   ABNORMALITY -Continued slow, left hemisphere IMPRESSION: This study is suggestive of cortical dysfunction in  left hemisphere, nonspecific etiology but could be secondary to underlying structural abnormality, postictal state.  No seizures or epileptiform discharges were seen throughout the recording. If suspicion for ictal-interictal activity persists, please consider prolonged EEG. Charlsie Quest   ECHOCARDIOGRAM COMPLETE  Result Date: 09/23/2021    ECHOCARDIOGRAM REPORT   Patient Name:   [facility]  Date of Exam: 09/23/2021 Medical Rec #:  [id number]     Height:       64.0 in Accession #:    [id number]    Weight:       165.0 lb Date of Birth:  [birth year]     BSA:          1.803 m Patient Age:    65 years      BP:           143/71 mmHg Patient Gender: F             HR:           86 bpm. Exam Location:  Inpatient Procedure: 2D Echo, Cardiac Doppler and Color Doppler Indications:    Stroke  History:        Patient has prior history of Echocardiogram examinations, most                 recent 04/22/2020. Stroke, Arrythmias:Atrial Fibrillation; Risk                 Factors:Hypertension.  Sonographer:    Vanetta Shawl Referring Phys: [id number] NISCHAL NARENDRA IMPRESSIONS  1. Left ventricular ejection fraction, by estimation, is 50 to 55%. The left ventricle has low normal function. The left ventricle has no regional wall motion abnormalities. Left ventricular diastolic function could not be evaluated.  2. Right ventricular systolic function is normal. The right ventricular size is normal. There is normal pulmonary artery systolic pressure.  3. Left atrial size was mild to moderately dilated.  4. The mitral valve is normal in structure. Trivial mitral valve regurgitation. No evidence of mitral stenosis.  5. Tricuspid valve regurgitation is mild to moderate.  6. The aortic valve is tricuspid. Aortic valve regurgitation is not visualized. No aortic stenosis is present.  7. The inferior vena cava is normal in size with <50% respiratory variability, suggesting right atrial pressure of 8 mmHg. Comparison(s): Changes from prior  study are noted. Low normal EF on current study, in afib. FINDINGS  Left Ventricle: Left ventricular ejection fraction, by estimation, is 50 to 55%. The left ventricle has low normal function. The left ventricle has no regional wall motion abnormalities. The left ventricular internal cavity size was normal in size. There is no left ventricular hypertrophy. Left ventricular diastolic function could not be evaluated due to atrial fibrillation. Left ventricular diastolic function could not be evaluated. Right Ventricle: The right ventricular size is normal. Right vetricular wall thickness was not well visualized. Right ventricular systolic function is normal. There is normal pulmonary artery systolic pressure. The tricuspid regurgitant velocity is 2.58 m/s, and with an assumed right atrial pressure of 8 mmHg, the estimated right ventricular systolic pressure is 34.6 mmHg. Left Atrium: Left atrial size was mild to moderately dilated. Right Atrium: Right atrial size was not well visualized. Pericardium: There is no evidence of pericardial effusion. Mitral Valve: The mitral valve is normal in structure. Mild mitral annular calcification. Trivial mitral valve regurgitation. No evidence of mitral valve stenosis. Tricuspid Valve: The tricuspid valve is normal in structure. Tricuspid valve regurgitation is mild to moderate. No evidence of tricuspid stenosis. Aortic Valve: The aortic valve is tricuspid. Aortic valve regurgitation is not visualized. No aortic stenosis is present. Aortic valve mean gradient measures 2.0 mmHg. Aortic valve peak gradient measures 3.3 mmHg. Aortic valve area, by VTI measures 2.08 cm. Pulmonic Valve: The pulmonic valve was grossly normal. Pulmonic valve regurgitation is not visualized. No evidence of pulmonic stenosis.  Aorta: The aortic root, ascending aorta, aortic arch and descending aorta are all structurally normal, with no evidence of dilitation or obstruction. Venous: The inferior vena cava  is normal in size with less than 50% respiratory variability, suggesting right atrial pressure of 8 mmHg. IAS/Shunts: The interatrial septum was not well visualized.  LEFT VENTRICLE PLAX 2D LVIDd:         3.80 cm   Diastology LVIDs:         3.00 cm   LV e' medial:    6.54 cm/s LV PW:         1.00 cm   LV E/e' medial:  16.8 LV IVS:        1.00 cm   LV e' lateral:   8.71 cm/s LVOT diam:     1.90 cm   LV E/e' lateral: 12.6 LV SV:         38 LV SV Index:   21 LVOT Area:     2.84 cm  RIGHT VENTRICLE            IVC RV S prime:     7.62 cm/s  IVC diam: 2.00 cm LEFT ATRIUM             Index LA diam:        4.10 cm 2.27 cm/m LA Vol (A2C):   41.9 ml 23.24 ml/m LA Vol (A4C):   45.3 ml 25.13 ml/m LA Biplane Vol: 44.1 ml 24.46 ml/m  AORTIC VALVE AV Area (Vmax):    2.13 cm AV Area (Vmean):   2.03 cm AV Area (VTI):     2.08 cm AV Vmax:           90.40 cm/s AV Vmean:          67.100 cm/s AV VTI:            0.184 m AV Peak Grad:      3.3 mmHg AV Mean Grad:      2.0 mmHg LVOT Vmax:         67.90 cm/s LVOT Vmean:        48.000 cm/s LVOT VTI:          0.135 m LVOT/AV VTI ratio: 0.73  AORTA Ao Root diam: 3.10 cm Ao Asc diam:  3.00 cm MITRAL VALVE                TRICUSPID VALVE MV Area (PHT): 4.03 cm     TR Peak grad:   26.6 mmHg MV Decel Time: 188 msec     TR Vmax:        258.00 cm/s MV E velocity: 109.67 cm/s                             SHUNTS                             Systemic VTI:  0.14 m                             Systemic Diam: 1.90 cm Jodelle RedBridgette Christopher MD Electronically signed by Jodelle RedBridgette Christopher MD Signature Date/Time: 09/23/2021/2:57:32 PM    Final    ECHOCARDIOGRAM LIMITED BUBBLE STUDY  Result Date: 09/27/2021    ECHOCARDIOGRAM LIMITED REPORT   Patient Name:   Lindsey BruinOLLEEN Slivinski Date of Exam: 09/27/2021 Medical Rec #:  [id number]  Height:       64.2 in Accession #:    [id number]    Weight:       146.2 lb Date of Birth:  January 07, 1956     BSA:          1.715 m Patient Age:    65 years      BP:            130/82 mmHg Patient Gender: F             HR:           95 bpm. Exam Location:  Inpatient Procedure: Limited Echo, Color Doppler, Cardiac Doppler and Saline Contrast            Bubble Study Indications:    Renal artery thrombosis  History:        Patient has prior history of Echocardiogram examinations, most                 recent 09/23/2021. Stroke; Risk Factors:Hypertension.  Sonographer:    Vanetta Shawl Referring Phys: [id number] JULIE ANNE WILLIAMS IMPRESSIONS  1. Left ventricular ejection fraction, by estimation, is 60 to 65%. The left ventricle has normal function. The left ventricle has no regional wall motion abnormalities. Left ventricular diastolic function could not be evaluated.  2. Left atrial size was moderately dilated.  3. Right atrial size was mildly dilated.  4. Tricuspid valve regurgitation is moderate to severe.  5. The aortic valve is tricuspid. Aortic valve regurgitation is not visualized. No aortic stenosis is present.  6. There is moderately elevated pulmonary artery systolic pressure.  7. The inferior vena cava is normal in size with <50% respiratory variability, suggesting right atrial pressure of 8 mmHg.  8. Agitated saline contrast bubble study was negative, with no evidence of any interatrial shunt. FINDINGS  Left Ventricle: Left ventricular ejection fraction, by estimation, is 60 to 65%. The left ventricle has normal function. The left ventricle has no regional wall motion abnormalities. The left ventricular internal cavity size was normal in size. There is  no left ventricular hypertrophy. Left ventricular diastolic function could not be evaluated. Left ventricular diastolic function could not be evaluated due to atrial fibrillation. Right Ventricle: There is moderately elevated pulmonary artery systolic pressure. The tricuspid regurgitant velocity is 3.12 m/s, and with an assumed right atrial pressure of 8 mmHg, the estimated right ventricular systolic pressure is 46.9 mmHg. Left Atrium:  Left atrial size was moderately dilated. Right Atrium: Right atrial size was mildly dilated. Pericardium: There is no evidence of pericardial effusion. Mitral Valve: Mild mitral annular calcification. Tricuspid Valve: Tricuspid valve regurgitation is moderate to severe. Aortic Valve: The aortic valve is tricuspid. Aortic valve regurgitation is not visualized. No aortic stenosis is present. Venous: The inferior vena cava is normal in size with less than 50% respiratory variability, suggesting right atrial pressure of 8 mmHg. IAS/Shunts: No atrial level shunt detected by color flow Doppler. Agitated saline contrast was given intravenously to evaluate for intracardiac shunting. Agitated saline contrast bubble study was negative, with no evidence of any interatrial shunt. LEFT VENTRICLE PLAX 2D LVIDd:         4.50 cm LVIDs:         2.90 cm LV PW:         0.90 cm LV IVS:        0.90 cm  IVC IVC diam: 1.80 cm LEFT ATRIUM         Index LA diam:  4.80 cm 2.80 cm/m  TRICUSPID VALVE TR Peak grad:   38.9 mmHg TR Vmax:        312.00 cm/s Thurmon Fair MD Electronically signed by Thurmon Fair MD Signature Date/Time: 09/27/2021/2:20:30 PM    Final    VAS [place] LOWER EXTREMITY VENOUS (DVT)  Result Date: 10/01/2021  Lower Venous DVT Study Patient Name:  Lindsey [name]  Date of Exam:   10/01/2021 Medical Rec #: [id number]      Accession #:    [id number] Date of Birth: 01-04-1956      Patient Gender: F Patient Age:   61 years Exam Location:  [facility] Procedure:      VAS [place] LOWER EXTREMITY VENOUS (DVT) Referring Phys: EMILY MULLEN --------------------------------------------------------------------------------  Indications: Left leg pain.  Limitations: Suboptimal patient positioning. Comparison       04-22-2020 Bilateral lower extremity venous was negative for Study:           DVT. Performing Technologist: Jean Rosenthal RDMS, RVT  Examination Guidelines: A complete evaluation includes B-mode imaging, spectral Doppler,  color Doppler, and power Doppler as needed of all accessible portions of each vessel. Bilateral testing is considered an integral part of a complete examination. Limited examinations for reoccurring indications may be performed as noted. The reflux portion of the exam is performed with the patient in reverse Trendelenburg.  +---------+---------------+---------+-----------+----------+--------------+  LEFT      Compressibility Phasicity Spontaneity Properties Thrombus Aging  +---------+---------------+---------+-----------+----------+--------------+  CFV       Full            Yes       Yes                                    +---------+---------------+---------+-----------+----------+--------------+  SFJ       Full                                                             +---------+---------------+---------+-----------+----------+--------------+  FV Prox   Full                                                             +---------+---------------+---------+-----------+----------+--------------+  FV Mid    Full                                                             +---------+---------------+---------+-----------+----------+--------------+  FV Distal Full                                                             +---------+---------------+---------+-----------+----------+--------------+  PFV       Full                                                             +---------+---------------+---------+-----------+----------+--------------+  POP       Full            Yes       Yes                                    +---------+---------------+---------+-----------+----------+--------------+  PTV       Full                                                             +---------+---------------+---------+-----------+----------+--------------+  PERO      Full                                                             +---------+---------------+---------+-----------+----------+--------------+     Summary: LEFT: - There  is no evidence of deep vein thrombosis in the lower extremity.  - No cystic structure found in the popliteal fossa.  *See table(s) above for measurements and observations. Electronically signed by Sherald Hess MD on 10/01/2021 at 4:30:00 PM.    Final     Labs:  Basic Metabolic Panel: Recent Labs  Lab 10/20/21 0537  NA 136  K 3.8  CL 104  CO2 22  GLUCOSE 103*  BUN 8  CREATININE 0.98  CALCIUM 8.7*    CBC: Recent Labs  Lab 10/20/21 0537  WBC 2.6*  HGB 10.2*  HCT 30.0*  MCV 85.0  PLT 243    CBG: No results for input(s): GLUCAP in the last 168 hours.  Brief HPI:   Lindsey [name] is a 66 y.o. female admitted secondary to pyelonephritis who developed change in speech pattern and found to have new moderate size left AICA cerebellar infarct.  She eventually was admitted to inpatient rehab on initially on September 24, 2021.  On the following evening, she developed mild tachycardia and fever to 102.5.  Her white blood cell count was elevated to 26,000.  Antibiotics were broadened.  CT scan of the abdomen pelvis was performed remarkable interim development of markedly abnormal appearance of her left kidney and new findings of thrombus within the left renal artery.  She was then transferred from the inpatient rehabilitation unit to acute care on internal medicine service.  She was started on a diltiazem infusion.  Her Eliquis was discontinued and she was started on heparin infusion.  Work-up with cardiolipin and beta-2 glycoprotein antibodies as well as lupus anticoagulant were obtained.  She remained in atrial fibrillation and was started on Lovenox 65 mg every 12 hours.  On 12/27 warfarin 2 mg was initiated.  Her expressive, receptive aphasia continued. She was started on sertraline with good tolerance and this was continued at discharge.   Hospital Course: Lindsey [name] was admitted to rehab 10/01/2021 for inpatient therapies to consist of PT, ST and OT at least three hours five days a  week. Past admission physiatrist, therapy team and rehab RN have worked together to provide customized collaborative inpatient rehab.  She has made slow progress and requiring significant encouragement and positive reinforcement to cooperate with therapy sessions.  Celexa  was started on 1/2 for depressed mood, however she preferred not to continue this.  She was tolerating her diet.  Per infectious disease service, her intravenous cefazolin was changed to cefadroxil twice daily to complete a 4-week course.  Keppra was continued for seizure prophylaxis.  Vitamin D supplements were given for suboptimal vitamin D level.  Bowel and bladder incontinence programs continued.  She was noted to have some mild bradycardia and her Lopressor was decreased to 50 mg twice daily.  Diltiazem 180 mg daily was continued. Mild anemia improved.  She was noted to have some mild bradycardia and her Lopressor was decreased to 50 mg twice daily.  Blood pressures were monitored on TID basis and metoprolol was increased to 62.5 mg on 10/07/2021. She was placed back on Lopressor 50 mg daily at discharge.  Rehab course: During patient's stay in rehab weekly team conferences were held to monitor patient's progress, set goals and discuss barriers to discharge. At admission, patient required min A/mod A  She  has had improvement in activity tolerance, balance, postural control as well as ability to compensate for deficits. She has had some improvement in functional use RUE/LUE  and RLE/LLE as well as improvement in awareness   Disposition:  There are no questions and answers to display.         Diet: heart healthy  Special Instructions:  No driving, alcohol consumption or tobacco use.   30-35 minutes were spent on discharge planning and discharge summary.   Discharge Instructions     Ambulatory referral to Hematology / Oncology   Complete by: As directed    Hypercoagulable work-up per Dr. Carlis Abbott. Positive lupus  anticoagulant while hospitalized and patient on heparin.   Ambulatory referral to Neurology   Complete by: As directed    An appointment is requested in approximately: 4 weeks     No driving, alcohol consumption or tobacco use.   30-35 minutes were spent on discharge planning and discharge summary.   Allergies as of 10/22/2021   No Known Allergies      Medication List     STOP taking these medications    cefadroxil 1 g tablet Commonly known as: DURICEF   enoxaparin 80 MG/0.8ML injection Commonly known as: LOVENOX   ondansetron 4 MG tablet Commonly known as: ZOFRAN       TAKE these medications    acetaminophen 325 MG tablet Commonly known as: TYLENOL Take 1-2 tablets (325-650 mg total) by mouth every 4 (four) hours as needed for mild pain. What changed:  medication strength how much to take when to take this reasons to take this   aspirin 81 MG EC tablet Take 1 tablet (81 mg total) by mouth daily. Swallow whole.   atorvastatin 20 MG tablet Commonly known as: LIPITOR Take 1 tablet (20 mg total) by mouth daily at 6 PM.   B-complex with vitamin C tablet Take 1 tablet by mouth daily.   Baclofen 5 MG Tabs Take 1 tablet (5 mg) by mouth at bedtime.   diltiazem 180 MG 24 hr capsule Commonly known as: CARDIZEM CD Take 1 capsule (180 mg total) by mouth daily.   levETIRAcetam 500 MG tablet Commonly known as: KEPPRA Take 1 tablet (500 mg total) by mouth every 12 (twelve) hours.   metoprolol tartrate 50 MG tablet Commonly known as: LOPRESSOR Take 1 tablet (50 mg total) by mouth 2 (two) times daily. What changed: Another medication with the same name was added. Make sure you understand how  and when to take each.   metoprolol tartrate 50 MG tablet Commonly known as: LOPRESSOR Take 1 tablet (50 mg total) by mouth 2 (two) times daily. What changed: You were already taking a medication with the same name, and this prescription was added. Make sure you understand  how and when to take each. Notes to patient: Duplicate medication order>>disregard   Multivitamin Adults 50+ Tabs Take 1 tablet by mouth daily.   sertraline 25 MG tablet Commonly known as: ZOLOFT Take 1 tablet (25 mg total) by mouth at bedtime.   Vitamin D (Ergocalciferol) 1.25 MG (50000 UNIT) Caps capsule Commonly known as: DRISDOL Take 1 capsule (50,000 Units total) by mouth every 7 (seven) days. Each Tuesday Start taking on: October 26, 2021   warfarin 2.5 MG tablet Commonly known as: COUMADIN Take 1 tablet (2.5 mg total) by mouth daily at 4 PM.        Follow-up Information     [facility]. Call.   Why: for appointment for follow-up of hospitalization/stroke Contact information: [address]     [address] [place] [address] [phone]        Kathlynn Grate, DO. Go on 10/27/2021.   Specialties: Infectious Diseases, Internal Medicine Why: Appointment on October 27, 2021 at 2:30 pm Contact information: [address] [place] [address] [place] [address] [phone]         [facility]. Go on 10/28/2021.   Why: Follow-up with Dr. Lafonda Mosses on January 26th at 11:30 am Contact information: [address] [place] [address] [phone]        [facility]. Call in 1 day(s).   Specialty: Oncology Why: Call tomorrow to set up appointment post hospitalization Contact information: [address] [id number] [place] [address] [phone]        Gildardo Pounds, [name]. Go on 10/27/2021.   Specialty: Physician Assistant Why: Appointment scheduled for 10/27/2021 at 10:40 am, To check INR and possible dose adjustment. If you prefer to go to [facility], please call them at [phone]. Contact information: [address] [place] [address] [phone]         Horton Chin, MD Follow up.    Specialty: Physical Medicine and Rehabilitation Why: office will call you to arrange your appt (sent) Contact information: [address] [place] [address] [phone]                 Signed: Milinda Antis 10/22/2021, 2:01 PM

## 2021-10-07 NOTE — Progress Notes (Signed)
Physical Therapy Session Note  Patient Details  Name: Lindsey [name] MRN: [id number] Date of Birth: [birth year]  Today's Date: 10/07/2021 PT Individual Time: 1300-1335 PT Individual Time Calculation (min): 35 min  and Today's Date: 10/07/2021 PT Missed Time: 25 Minutes Missed Time Reason: Patient unwilling to participate;Patient fatigue  Short Term Goals: Week 1:  PT Short Term Goal 1 (Week 1): Patient will perform bed mobility with min A >50% of the time without use of hospital bed features. PT Short Term Goal 2 (Week 1): Patient will perform basic transfers with min A >50% of the time using LRAD. PT Short Term Goal 3 (Week 1): Patient will ambulate >50 feet with min A using LRAD. PT Short Term Goal 4 (Week 1): Patient will improved Berg Balance Scale by 7 points to meed MCD.  Skilled Therapeutic Interventions/Progress Updates:     Pt resting in recliner at start of session with grandson at bedside. Pt initially refusing PT treatment even with grandson encouraging. Grandson called pt's daughter via face-time to assist with motivating and convincing pt to participate in therapy sessions. Ultimately, pt was agreeable but reluctant. Assisted in squat<>pivot transfer from recliner to w/c with modA overall, requiring assist for foot placement and facilitation for forward weight shift. Transported in w/c to main rehab hallway to focus remainder of session on gait training using L hand rail and +2 assist for w/c follow for safety. She ambulated ~37ft + ~61ft (seated rest) with minA for balance and modA for facilitating gait on RLE. Facilitation primarily needed for lateral weight shifting and advancing RLE in swing phase. Pt responding better to tactile > verbal cueing for gait sequencing. After 2nd gait trial, pt refusing further PT intervention despite max encouragement. Returned to her room and assisted to bed via stand<>pivot transfer with modA and bed rail. Required modA for sit>Supine for BLE management  and trunk support. Pillows provided for LUE elevation and comfort. All needs in reach, bed alarm on at end of session. She missed 25 minutes of skilled therapy due to refusal.  Therapy Documentation Precautions:  Precautions Precautions: Fall, Other (comment) Precaution Comments: Baseline R-side hemiplegia; global aphasia Required Braces or Orthoses: Other Brace, Splint/Cast Splint/Cast: Baseline RUE resting hand splint Other Brace: Baseline R AFO for ambulation Restrictions Weight Bearing Restrictions: No General:    Therapy/Group: Individual Therapy  Orrin Brigham 10/07/2021, 7:54 AM

## 2021-10-07 NOTE — Progress Notes (Signed)
PROGRESS NOTE   Subjective/Complaints: No new complaints this morning. I discussed her d/c date and she was disappointed with this- I encouraged her to participate in therapy as best as she can   ROS: unable to assess due to aphasia   Objective:   No results found. Recent Labs    10/06/21 0042  WBC 5.1  HGB 9.1*  HCT 27.5*  PLT 462*   Recent Labs    10/06/21 0042  NA 134*  K 4.0  CL 102  CO2 21*  GLUCOSE 96  BUN 9  CREATININE 1.08*  CALCIUM 8.7*     Intake/Output Summary (Last 24 hours) at 10/07/2021 0940 Last data filed at 10/06/2021 1815 Gross per 24 hour  Intake 0 ml  Output --  Net 0 ml        Physical Exam: Vital Signs Blood pressure (!) 138/105, pulse 65, temperature 98.7 F (37.1 C), resp. rate 18, SpO2 100 %. .Gen: no distress, normal appearing HEENT: oral mucosa pink and moist, NCAT Cardio: Reg rate Chest: normal effort, normal rate of breathing Abd: soft, non-distended Ext: no edema Psych: very flat affect Neurological: aphasic; sleepy; mods head yes and no appropriately.  Skin: intact    Mental Status: She is alert.     Comments: Pt is alert. Follows simple basic commands. Nods head "yes/no" to simple questions. Exp>rec aphasia. Did not engage much however during exam due to mood. RUE tr/5. RLE 1-2/5 prox to 0-trace distally (poor effort). Moves left arm and leg freely. Decreased pain sense Right arm and leg. Does have resting flexor tone right wrist and elbow.   GU: improving continence      Assessment/Plan: 1. Functional deficits which require 3+ hours per day of interdisciplinary therapy in a comprehensive inpatient rehab setting. Physiatrist is providing close team supervision and 24 hour management of active medical problems listed below. Physiatrist and rehab team continue to assess barriers to discharge/monitor patient progress toward functional and medical goals  Care  Tool:  Bathing    Body parts bathed by patient: Face, Left upper leg   Body parts bathed by helper: Buttocks, Front perineal area, Left lower leg, Right lower leg, Face, Abdomen, Chest, Right arm, Left arm     Bathing assist Assist Level: Total Assistance - Patient < 25%     Upper Body Dressing/Undressing Upper body dressing   What is the patient wearing?: Hospital gown only, Pull over shirt    Upper body assist Assist Level: Maximal Assistance - Patient 25 - 49%    Lower Body Dressing/Undressing Lower body dressing      What is the patient wearing?: Pants, Incontinence brief     Lower body assist Assist for lower body dressing: 2 Helpers     Toileting Toileting    Toileting assist Assist for toileting: 2 Helpers     Transfers Chair/bed transfer  Transfers assist     Chair/bed transfer assist level: Maximal Assistance - Patient 25 - 49%     Locomotion Ambulation   Ambulation assist   Ambulation activity did not occur: Safety/medical concerns          Walk 10 feet activity   Assist  Walk 10 feet activity did not occur: Safety/medical concerns        Walk 50 feet activity   Assist Walk 50 feet with 2 turns activity did not occur: Safety/medical concerns         Walk 150 feet activity   Assist Walk 150 feet activity did not occur: Safety/medical concerns         Walk 10 feet on uneven surface  activity   Assist Walk 10 feet on uneven surfaces activity did not occur: Safety/medical concerns         Wheelchair     Assist Is the patient using a wheelchair?: Yes Type of Wheelchair: Manual    Wheelchair assist level: Minimal Assistance - Patient > 75% Max wheelchair distance: 150    Wheelchair 50 feet with 2 turns activity    Assist        Assist Level: Minimal Assistance - Patient > 75%   Wheelchair 150 feet activity     Assist      Assist Level: Minimal Assistance - Patient > 75%   Blood pressure  (!) 138/105, pulse 65, temperature 98.7 F (37.1 C), resp. rate 18, SpO2 100 %.  Medical Problem List and Plan: 1. 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 She'll need some motivation at times to participate  Start B/C vitamin complex  Continue CIR- PT, OT and SLP  I have rescheduled her outpatient Botox for February.  2.  Atrial fibrillation/acute renal artery thrombosis: -DVT/anticoagulation:  INR 2.5 today, give warfarin 0.1mg  today Pending hypercoagulable labs, primary care provider could consider transitioning back to NOAC if the work-up is negative.  Consider hematology referral as outpatient.             -antiplatelet therapy: Aspirin 81 mg daily 3. Pain Management: Tylenol 4. Situational depression: LCSW to evaluate and provide emotional support             -antipsychotic agents: N/A             -daughter supportive. Pt is unhappy about still being in the hospital. Will need encouragement and positive reinforcement from team. Discuss with daughter whether she may benefit from antidepressant. She does not want an anti-depressant so I will stop the Celexa that was started for her 5. Neuropsych: This patient is not capable of making decisions on her own behalf. 6. Skin/Wound Care: Routine skin care checks 7. Fluids/Electrolytes/Nutrition: Ins and outs and follow-up chemistries. Dys 3 diet 8.  9.  HTN: Increase metoprolol to 62.5 mg BID -HR elevated to 150 this morning with PT. Observe for patterns.  -consider resuming amlodipine (home med) bp permitting -magnesium low but will not supplement given AKI 10: Klebsiella positive, multifocal pyelonephritis with abscess: Infectious disease recommend transition from intravenous cefazolin to cefadroxil twice a day on discharge to complete a 4-week  treatment course.  Stop date of the 10/20/2021. 11: Possible seizure activity:  --continue Keppra 12: Constipation: continue prn miralax 13: AKI: baseline Scr> 0.70. 1.74 on 12/26,  1.32 on 12/28. Monitor trend. 1/2- Cr 1.29 yesterday-  14.  Mild anemia: No indication for transfusion.  Monitor H&H.  1/2- Hb dropped slightly to 9.7 from 10.3 15. Suboptimal vitamin D: start ergocalciferol 50,000U  once per week for 7 weeks. 16. Urinary incontinence: improving, continue bladder program    LOS: 6 days A FACE TO FACE EVALUATION WAS PERFORMED  Martha Clan P Wanetta Funderburke 10/07/2021, 9:40 AM

## 2021-10-07 NOTE — Progress Notes (Signed)
Speech Language Pathology Daily Session Note  Patient Details  Name: Opal Alvillar MRN: [id number] Date of Birth: 1956-10-02  Today's Date: 10/07/2021 SLP Individual Time: [id number] SLP Individual Time Calculation (min): 55 min  Short Term Goals: Week 1: SLP Short Term Goal 1 (Week 1): Patient will consume current diet with minimal overt s/s of aspiration with Min verbal cues for use of swallowing compensatory strategies. SLP Short Term Goal 2 (Week 1): Patient will demonstrate selective attention to a task in a mildly distracting enviornment for 20 minutes with Min verbal cues for redirection. SLP Short Term Goal 3 (Week 1): Patient will utilize multimodal communication to express wants/needs with Mod A multimodal cues. SLP Short Term Goal 4 (Week 1): Patient will answer mildly abstract yes/no questions with 75% accuracy and Mod verbal cues. SLP Short Term Goal 5 (Week 1): Patient will produce 3 words that are personally relevant/significant with 50% accuracy and Mod A multimodal cues.  Skilled Therapeutic Interventions:   Patient seen for skilled ST session with grandson present in room, and focus of session on aphasia goals. Patient was in bed and initially appeared to be tired and/or not wanting to participate, however when SLP started to adjust [facility] more upright and introduce materials on table in front of her, she was agreeable and participatory. She pointed to object pictures in field of 2 when named with 90% accuracy and pointed to objects in field of 3 with 75% accuracy. She performed automatic speech tasks of counting and saying alphabet with SLP providing written/visual and initial few words cue. She named 3 objects at word level when presented with phrase completion cue. For all others, she initially only could imitate at word level however after third trial, she was able to name with partial phrase and initial phoneme cues. Patient pointed to printed month in field of three choices,  pointed to day of week with 7 choices and pointed to year with three choices and was accurate for each. She was left in bed with grandson present in room. She continues to benefit from skilled SLP intervention to maximize language function prior to discharge.  Pain Pain Assessment Pain Scale: Faces Faces Pain Scale: No hurt  Therapy/Group: Individual Therapy  Sonia Baller, MA, CCC-SLP Speech Therapy

## 2021-10-07 NOTE — Progress Notes (Signed)
ANTICOAGULATION CONSULT NOTE - Follow Up Consult  Pharmacy Consult for Warfarin Indication:  Renal artery thrombosis , hx afib, possible antiphospholipid antibody syndrome  No Known Allergies  Patient Measurements: Heparin Dosing Weight: 66 kg   Vital Signs: Temp: 98.7 F (37.1 C) (01/05 0412) BP: 138/105 (01/05 0412) Pulse Rate: 65 (01/05 0412)  Labs: Recent Labs    10/05/21 0531 10/06/21 0042 10/06/21 0542 10/07/21 0532  HGB  --  9.1*  --   --   HCT  --  27.5*  --   --   PLT  --  462*  --   --   LABPROT 33.8*  --  29.9* 27.1*  INR 3.3*  --  2.9* 2.5*  CREATININE  --  1.08*  --   --     Estimated Creatinine Clearance: 48.9 mL/min (A) (by C-G formula based on SCr of 1.08 mg/dL (H)).   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 Pt initially on heparin infusion, now to transition to warfarin with enoxaparin bridge given new clot while on apixaban.  Patient also undergoing evaluation for possible antiphospholipid antibody syndrome.  Lovenox now stopped  INR 2.5 today   Goal of Therapy:  INR 2-3 Monitor platelets by anticoagulation protocol: Yes   Plan:  Warfarin 1 mg po x 1 Daily INR   Thank you Anette Guarneri, PharmD  10/07/2021 8:31 AM  **Pharmacist phone directory can be found on [email or web address] listed under [place]**

## 2021-10-07 NOTE — Progress Notes (Signed)
Occupational Therapy Session Note  Patient Details  Name: Lindsey [name] MRN: [id number] Date of Birth: [birth year]  Today's Date: 10/07/2021 OT Individual Time: 1115-1145 OT Individual Time Calculation (min): 30 min    Short Term Goals: Week 1:  OT Short Term Goal 1 (Week 1): Pt will complete sit to stand with no lift and max A in prep for standing ADL. OT Short Term Goal 2 (Week 1): Pt will complete grooming task at sink with no more than min VCs and S. OT Short Term Goal 3 (Week 1): Pt will don shirt with max A. OT Short Term Goal 4 (Week 1): Pt will bathe UB with mod A.   Skilled Therapeutic Interventions/Progress Updates:    Pt sitting up in recliner, shaking head no to pain.  Pt shaking head yes when asked if she wants to wash up and change clothes.  Required total assist to donn bilateral shoes. Completed sit to stand with mod assist and multimodal cues to shift weight anteriorly.  Stand pivot with mod assist and cues for sequencing of steps and weight shifting and to slow descent during stand to sit portion.  Pt transported to sink and required max cues to initiate turning water on.  Pt picked up washcloth and lathered in soap applied by therapist. Pt then pausing without initiating next step despite max cues and including question cues to encourage pt centered participation ("do you want to wash your face first" "or would you like to take your shirt off and wash your upper body?") Pt shaking head no and resisting therapists efforts to assist in doffing shirt.  When pt asked if she would like to do something else like go to the gym to work on balance/strengthening and then asked if she would like to go outside, pt shaking head no to each item. Therapist asked pt if she was in pain and shook her head no.  Pt stating verbally "yes" when asked if she is tired.  Pt also shaking head yes when asked if she wants to go back to recliner.  Stand pivot with mod assist w/c to recliner.  Call bell in  reach, seat alarm on.  Missed 30 minutes of treatment.  Therapy Documentation Precautions:  Precautions Precautions: Fall, Other (comment) Precaution Comments: Baseline R-side hemiplegia; global aphasia Required Braces or Orthoses: Other Brace, Splint/Cast Splint/Cast: Baseline RUE resting hand splint Other Brace: Baseline R AFO for ambulation Restrictions Weight Bearing Restrictions: No     Therapy/Group: Individual Therapy  Amie Critchley 10/07/2021, 2:21 PM

## 2021-10-08 DIAGNOSIS — I69320 Aphasia following cerebral infarction: Secondary | ICD-10-CM

## 2021-10-08 LAB — PROTIME-INR
INR: 2.6 — ABNORMAL HIGH (ref 0.8–1.2)
Prothrombin Time: 27.5 seconds — ABNORMAL HIGH (ref 11.4–15.2)

## 2021-10-08 MED ORDER — METOPROLOL TARTRATE 50 MG PO TABS
50.0000 mg | ORAL_TABLET | Freq: Two times a day (BID) | ORAL | Status: DC
  Administered 2021-10-08 – 2021-10-11 (×6): 50 mg via ORAL
  Filled 2021-10-08 (×6): qty 1

## 2021-10-08 MED ORDER — WARFARIN SODIUM 1 MG PO TABS
1.0000 mg | ORAL_TABLET | Freq: Once | ORAL | Status: AC
  Administered 2021-10-08: 1 mg via ORAL
  Filled 2021-10-08: qty 1

## 2021-10-08 NOTE — Progress Notes (Signed)
Occupational Therapy Session Note  Patient Details  Name: Lindsey [name] MRN: [id number] Date of Birth: 03/31/1956  Today's Date: 10/08/2021 OT Individual Time: 1300-1358 OT Individual Time Calculation (min): 58 min    Short Term Goals: Week 1:  OT Short Term Goal 1 (Week 1): Pt will complete sit to stand with no lift and max A in prep for standing ADL. OT Short Term Goal 2 (Week 1): Pt will complete grooming task at sink with no more than min VCs and S. OT Short Term Goal 3 (Week 1): Pt will don shirt with max A. OT Short Term Goal 4 (Week 1): Pt will bathe UB with mod A.  Skilled Therapeutic Interventions/Progress Updates:    Pt received sitting in the w/c with no indications of pain. Aphasia challenging throughout session but pt able to point and use single words to get points across. She was agreeable to toileting tasks. She completed a stand pivot transfer with mod A, requiring manual facilitation to pivot RLE and to release LUE from the grab bar. Max A for toileting tasks. Pt did have continent urine and BM void. She was able to assist with hand  Min A stand pivot back to w/c, toward the L.  She was able to stand with mod A and then required only min to remain standing while she performed peri hygiene with mod. Resting hand splint removed to perform hand hygiene. UB bathing with mod A. Mod cueing/facilitation for Hemi dressing technique- mod A overall to don shirt. She completed 5 sit <> stands at the sink with alternating LUE on the w/c and on the sink- min  A overall with cueing for midline orientation. Pt completed a stand pivot transfer toward the L back to bed with min A. She was left supine with all needs met, resting hand splint back on, bed alarm set.   Therapy Documentation Precautions:  Precautions Precautions: Fall, Other (comment) Precaution Comments: Baseline R-side hemiplegia; global aphasia Required Braces or Orthoses: Other Brace, Splint/Cast Splint/Cast: Baseline RUE  resting hand splint Other Brace: Baseline R AFO for ambulation Restrictions Weight Bearing Restrictions: No   Therapy/Group: Individual Therapy  Curtis Sites 10/08/2021, 6:27 AM

## 2021-10-08 NOTE — Progress Notes (Signed)
Speech Language Pathology Weekly Progress and Session Note  Patient Details  Name: Lindsey [name] MRN: [id number] Date of Birth: 08-01-1956  Beginning of progress report period: 10/02/2021 End of progress report period: 10/08/2021  Today's Date: 10/08/2021 SLP Individual Time: 1100-1135 SLP Individual Time Calculation (min): 35 min  Short Term Goals: Week 1: SLP Short Term Goal 1 (Week 1): Patient will consume current diet with minimal overt s/s of aspiration with Min verbal cues for use of swallowing compensatory strategies. SLP Short Term Goal 1 - Progress (Week 1): Met SLP Short Term Goal 2 (Week 1): Patient will demonstrate selective attention to a task in a mildly distracting enviornment for 20 minutes with Min verbal cues for redirection. SLP Short Term Goal 2 - Progress (Week 1): Progressing toward goal SLP Short Term Goal 3 (Week 1): Patient will utilize multimodal communication to express wants/needs with Mod A multimodal cues. SLP Short Term Goal 3 - Progress (Week 1): Not met SLP Short Term Goal 4 (Week 1): Patient will answer mildly abstract yes/no questions with 75% accuracy and Mod verbal cues. SLP Short Term Goal 4 - Progress (Week 1): Not met SLP Short Term Goal 5 (Week 1): Patient will produce 3 words that are personally relevant/significant with 50% accuracy and Mod A multimodal cues. SLP Short Term Goal 5 - Progress (Week 1): Progressing toward goal    New Short Term Goals: Week 2: SLP Short Term Goal 1 (Week 2): Patient will name common objects with phrase completion and printed word cues, with modA verbal cues for 75% accuracy SLP Short Term Goal 2 (Week 2): Patient will respond to mildly complex yes/no questions verbally or with head nod/shake with 80% accuracy and modA cues to respond. SLP Short Term Goal 3 (Week 2): Patient will consume trials of regular solids snacks with SLP with no more than mild amount of oral residuals and modA cues to clear. SLP Short Term Goal 4  (Week 2): Patient will imitate to produce functional phrases (help me, etc) with maxA verbal modeling cues to perform. SLP Short Term Goal 5 (Week 2): Patient will comment and/or request help either verbally or via gestures with modA verbal, gestural cues to initiate.  Weekly Progress Updates:  Patient met one goal (swallow function) and is making progress towards a couple of the goals she did not meet. Patient has not yet demonstrated consistent performance or participation in therapies and has often been fixated on wanting "to go home". When she is successful, she does seem to at least be somewhat encouraged/motivated by this success. She is now able to imitate at word level, name at word level with written word cue, object cue, partial phrase and initial phoneme cues. She is able to respond to general and wants/needs based yes/no questions with increasing accuracy. She is expected to continue to improve and expect participation to improve as well with continued skilled SLP intervention.   Intensity: Minumum of 1-2 x/day, 30 to 90 minutes Frequency: 3 to 5 out of 7 days Duration/Length of Stay: 18-21 days Treatment/Interventions: Cognitive remediation/compensation;Dysphagia/aspiration precaution training;Internal/external aids;Speech/Language facilitation;Cueing hierarchy;Environmental controls;Therapeutic Activities;Functional tasks;Patient/family education   Daily Session  Skilled Therapeutic Interventions: Patient seen for skilled ST session focused on aphasia goals. Patient in [facility], awake and alert but shaking head when SLP getting materials together for session. She was able to more quickly name familiar objects used in previous session and name them when shown object as well as printed name and SLP providing partial phrase. She did not  exhibit as many phonemic errors as she had in yesterday's session. She did require frequent redirection cues and words of encouragement as she would look away,  hold head down frequently. After 35 minutes of session, patient started looking more noticeably frustrated, shaking her head more frequently, saying, "want to go home". SLP acknowledged patient's feelings and frustrations but encouraged her to continue to work. She repeatedly shook her head and when asked if she wanted SLP to end session, she nodded head. She was left in [facility] with all needs in place. She continues to benefit from skilled SLP intervention to maximize cognitive-linguistic function and swallow function prior to discharge.     General    Pain Pain Assessment Pain Scale: Faces Pain Score: 0-No pain  Therapy/Group: Individual Therapy  Sonia Baller, MA, CCC-SLP Speech Therapy

## 2021-10-08 NOTE — Consult Note (Signed)
Neuropsychological Consultation   Patient:   Lindsey [name]   DOB:   Jul 04, 1956  MR Number:  [id number]  Location:  [facility] [id number] [place] [address] Dept: [place]: [phone]           Date of Service:   10/08/2021  Start Time:   10 AM End Time:   11 AM  Provider/Observer:  Ilean Skill, Psy.D.       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  Patient presented to emergency department on 09/19/2021 with new onset nausea and vomiting diaphoresis as well as change in baseline mental status.  Patient also medical work-up indicating significant infection.  CT scan of head was negative but MRI performed on 12/21 revealed an acute moderate sized left AICA cerebellar infarct.  There is also underlying extensive chronic left MCA territory encephalomalacia related to her 2021 infarct.  There was also associated left brainstem Lindsey [name] and degeneration since the previous year study.  After medical care addressing infection and kidney issues the patient was referred to physical medicine for CIR services on 12/23.  During her care patient also had worsening status regarding infection as well as kidney issues that were addressed on the unit.  Patient was referred for neuropsychological consultation as she has displayed increased frustration and coping issues.  Reason for Service:  Patient was referred for neuropsychological consultation for coping and adjustment issues in the setting of significant expressive aphasia.  Below is the HPI for the current admission.  HPI: Lindsey [name] is a 66 year old female with a past medical history significant for 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  She  presented to the emergency department on September 19, 2021 with new onset of nausea, vomiting diaphoresis as well as change in baseline mental status as reported by her family.  She had no report of loss of consciousness or seizure type activity.  No known fever or chills.  As part of her work-up, she underwent CT scan of the abdomen pelvis which revealed multifocal pyelonephritis of the right kidney.  Urology was contacted and Dr. Jeffie Pollock reviewed the CT scan and the patient's data.  He recommended infectious disease consultation.  She was started on empiric antibiotics.  Her urine was positive for Klebsiella.  She was transitioned to oral Levaquin on 1219. On admission, CT scan of the head was also performed which was negative.  Due to worsening speech, MRI was performed on 12/21 which revealed an acute moderate sized left AICA cerebellar infarct.  Underlying extensive chronic left MCA territory encephalomalacia related to the 2021 infarct was noted.  Associated left brainstem wallerian degeneration since last year was noted.  Small superimposed chronic left PICA cerebellar infarcts were new since last year.  She developed elevated heart rate, decreased responsiveness and foaming at her mouth on 12/22 and rapid response was called.  Neurology evaluated and Keppra load of 1500 mg IV was given and she was started on Keppra 500 mg twice a day.  Aspirin 81 mg was added.  2D echo was completed and was negative.  Ejection fraction was estimated at 50 to 55%.   Patient was admitted to physical medicine and rehab inpatient services on September 24, 2021.  On the evening of  12/24, she developed mild tachycardia and fever to 102.5.  Lab work was completed at her repeat white blood cell count revealed elevation to 26,000.  His antibiotics were broadened to include vancomycin and Zosyn.  IV fluids are started for volume support.  On 12/25, repeat CT of the abdomen pelvis were performed.  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  EKG revealed a heart rate of 139 with rapid ventricular response..  Lopressor was given intravenously.  These findings and the case were discussed with internal medicine and the patient was transferred from inpatient rehab to acute care internal medicine service.  She was started on diltiazem infusion. Eliquis was discontinued and she was started on heparin infusion.  As she was maintained on Eliquis throughout her admission to CIR, concern for antiphospholipid antibody syndrome was considered.  Work-up with cardiolipin and beta-2 glycoprotein antibodies are pending.  Lupus anticoagulant is also pending however this may not be reliable since she was receiving heparin infusion.  She remains in A. fib on review of telemetry but her rates are overall improved.  She was started on Lovenox 65 mg every 12 hours on 12/27.  Her first dose of warfarin 2 mg was administered on 12/27 and 1645 hrs.Infectious disease consultation was obtained by Dr. Judyann Munson. Antibiotics were narrowed back to cefazolin since no new source of infection was found.  Her white blood cell count improved.  She has been afebrile for approximately 48 hours.   Laboratory work-up revealed acute kidney injury with increase in serum creatinine from 0.79 on 12/23 to 1.61 on 12/24.  Trended upward to 1.74 on 12/26.  Today, her serum creatinine is 1.32.  She had 1800 cc of urine output yesterday.   Patient requires inpatient medicine and rehabilitation evaluations and services for ongoing dysfunction secondary to cerebellar stroke.  Current Status:  While the patient was awake and sitting up in her wheelchair when I entered the room there was significant limitations in ability to communicate.  The patient was clearly able to express her frustration and difficulties with expressive communication capacity.  The patient was able  to give yes no answers and clearly expressed her acute desire to go home.  However, beyond one-word expressions and communicating yes no responses there was a limited to her ability to express her frustrations further.  Behavioral Observation: Lindsey [name]  presents as a 66 y.o.-year-old Right handed African American Female who appeared her stated age. her dress was Appropriate and she was Well Groomed and her manners were Appropriate to the situation.  her participation was indicative of Redirectable behaviors.  There were physical disabilities noted.  she displayed an appropriate level of cooperation and motivation.     Interactions:    Minimal Redirectable  Attention:   abnormal and attention span appeared shorter than expected for age  Memory:   not examined;   Visuo-spatial:  not examined  Speech (Volume):  low  Speech:   non-fluent aphasia; non-fluent aphasia  Thought Process:  Circumstantial  Though Content:  Rumination; clear frustration with inability to express self effectively  Orientation:   person and place  Judgment:   Fair  Planning:   Poor  Affect:    Irritable  Mood:    Dysphoric  Insight:   Fair  Intelligence:   normal  Medical History:   Past Medical History:  Diagnosis Date   Diverticulosis  Hypertension    Uterine prolapse          Patient Active Problem List   Diagnosis Date Noted   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)    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HCC)    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 ([place]) 09/24/2021   Cerebral embolism with cerebral infarction 09/23/2021   Pyelonephritis 09/19/2021   Pain due to onychomycosis of toenails of both feet 11/19/2020   Anemia 05/31/2020   Acute ischemic left MCA stroke ([place]) 04/30/2020   Right hemiplegia ([place]) 04/30/2020   CVA (cerebral vascular accident) ([name]) 04/21/2020   Atrial fibrillation with  RVR ([place]) 04/21/2020   Essential hypertension 04/21/2020   Alcohol abuse 04/21/2020        Family Med/Psych History:  Family History  Problem Relation Age of Onset   Breast cancer Neg Hx    Impression/DX:  It was very difficult to get in-depth answers and ascertain how much I was explaining to her was being adequately understood although she did appear to have good receptive language capacity.  Significant motor deficits and expressive language were noted          Electronically Signed   _______________________ Ilean Skill, Psy.D. Clinical Neuropsychologist

## 2021-10-08 NOTE — Progress Notes (Signed)
Occupational Therapy Session Note  Patient Details  Name: Lindsey [name] MRN: [id number] Date of Birth: [birth year]  Today's Date: 10/08/2021 OT Individual Time: 0732-0802 OT Individual Time Calculation (min): 30 min    Short Term Goals: Week 1:  OT Short Term Goal 1 (Week 1): Pt will complete sit to stand with no lift and max A in prep for standing ADL. OT Short Term Goal 2 (Week 1): Pt will complete grooming task at sink with no more than min VCs and S. OT Short Term Goal 3 (Week 1): Pt will don shirt with max A. OT Short Term Goal 4 (Week 1): Pt will bathe UB with mod A.  Skilled Therapeutic Interventions/Progress Updates:  Patient met lying supine in bed in agreement with OT treatment session. No overt s/s of pain at rest or with activity. Patient poorly positioned requiring Mod A and HOB lowered for supine scoot toward HOB. Patient found to be incontinent of bladder requiring Total A for hygiene/clothing management at bed level. LB dressing at bed level with assist to thread BLE. Patient able to lift hips off bed surfaces with semi bridge to assist with hiking pants over hips. Max A for rolling to L and for side-lying to sitting at EOB. Sit to stand in stedy with Min guard and LUE x2 trials. Seated in wc at sink level, patient completed 2/3 grooming tasks with Min A overall. Able to make some needs known but continues to be limited in communication by global aphasia. Session concluded with patient seated in wc with call bell within reach, belt alarm activated and all needs met. NT present to assist with self-feeding.   Therapy Documentation Precautions:  Precautions Precautions: Fall, Other (comment) Precaution Comments: Baseline R-side hemiplegia; global aphasia Required Braces or Orthoses: Other Brace, Splint/Cast Splint/Cast: Baseline RUE resting hand splint Other Brace: Baseline R AFO for ambulation Restrictions Weight Bearing Restrictions: No General:    Therapy/Group:  Individual Therapy  Deveney Bayon R [place] 10/08/2021, 6:46 AM

## 2021-10-08 NOTE — Progress Notes (Addendum)
ANTICOAGULATION CONSULT NOTE - Follow Up Consult  Pharmacy Consult for Warfarin Indication:  Renal artery thrombosis , hx afib, possible antiphospholipid antibody syndrome  No Known Allergies  Patient Measurements: Heparin Dosing Weight: 66 kg   Vital Signs: Temp: 98.2 F (36.8 C) (01/06 0405) BP: 118/73 (01/06 0405) Pulse Rate: 58 (01/06 0405)  Labs: Recent Labs    10/06/21 0042 10/06/21 0542 10/07/21 0532 10/08/21 0521  HGB 9.1*  --   --   --   HCT 27.5*  --   --   --   PLT 462*  --   --   --   LABPROT  --  29.9* 27.1* 27.5*  INR  --  2.9* 2.5* 2.6*  CREATININE 1.08*  --   --   --     Estimated Creatinine Clearance: 48.9 mL/min (A) (by C-G formula based on SCr of 1.08 mg/dL (H)).   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 Pt initially on heparin infusion, now to transition to warfarin with enoxaparin bridge given new clot while on apixaban.  Patient also undergoing evaluation for possible antiphospholipid antibody syndrome.  Lovenox now stopped  INR 2.6 today   Goal of Therapy:  INR 2-3 Monitor platelets by anticoagulation protocol: Yes   Plan:  Warfarin 1 mg po x 1 Daily INR for now, change to MWF next week   Thank you Anette Guarneri, PharmD  10/08/2021 8:36 AM  **Pharmacist phone directory can be found on [email or web address] listed under [place]**

## 2021-10-08 NOTE — Progress Notes (Signed)
Physical Therapy Session Note  Patient Details  Name: Lindsey [name] MRN: [id number] Date of Birth: 12-29-1955  Today's Date: 10/08/2021 PT Individual Time: 0901-0932 PT Individual Time Calculation (min): 31 min   Today's Date: 10/08/2021 PT Missed Time: 29 Minutes Missed Time Reason: Patient unwilling to participate  Short Term Goals: Week 1:  PT Short Term Goal 1 (Week 1): Patient will perform bed mobility with min A >50% of the time without use of hospital bed features. PT Short Term Goal 2 (Week 1): Patient will perform basic transfers with min A >50% of the time using LRAD. PT Short Term Goal 3 (Week 1): Patient will ambulate >50 feet with min A using LRAD. PT Short Term Goal 4 (Week 1): Patient will improved Berg Balance Scale by 7 points to meed MCD.  Skilled Therapeutic Interventions/Progress Updates:   Received pt sitting in WC, pt initially shaking head "no" to therapy but with encouragement, allowed therapist to take her out of room. Pt did not appear to be in any pain but when therapist asked if pt was ok, pt stated "no" but unable to further communicate why due to aphasia. Pt transported to/from dayroom in [facility] total A and worked on seated BLE strengthening on Kinetron at 20 cm/sec for 30 seconds x 3 trials and 20 seconds x 1 trial with LUE support and emphasis on glute/quad strengthening. Pt frequently stopping early and shaking head "no" as if not wanting to continue. Transitioned to sit<>stands x 2 reps at table in dayroom with mod A and guarding R knee. Pt required cues and manual assist to scoot to edge of chair and for LUE placement on WC armrest. Once standing worked on pre-gait stepping with LLE x5 reps. Pt frequently plopping back down into WC when sitting, with difficulty flexing forward at hips despite cues. Pt then refused any further participation and insisted on returning to room. Concluded session with pt sitting in WC watching TV, needs within reach, and seatbelt alarm on. 29  minutes missed of skilled physical therapy due to unwillingness to participate any further.   Therapy Documentation Precautions:  Precautions Precautions: Fall, Other (comment) Precaution Comments: Baseline R-side hemiplegia; global aphasia Required Braces or Orthoses: Other Brace, Splint/Cast Splint/Cast: Baseline RUE resting hand splint Other Brace: Baseline R AFO for ambulation Restrictions Weight Bearing Restrictions: No  Therapy/Group: Individual Therapy Alfonse Alpers PT, DPT   10/08/2021, 7:28 AM

## 2021-10-08 NOTE — Progress Notes (Signed)
Occupational Therapy Weekly Progress Note  Patient Details  Name: Lindsey [name] MRN: [id number] Date of Birth: 07-05-1956  Beginning of progress report period: 10/02/21 End of progress report period: 10/08/21  Patient has met 4 of 4 short term goals.  Despite limited participation at times with OT and PT, [name] has made good progress from admission. She is able to complete UB ADLs with mod A overall. Toileting tasks require mod A when pt is motivated to complete them. Her LUE continues to have strong flexor tone with no active movement. Family education is to be scheduled by CSW with her daughter.   Patient continues to demonstrate the following deficits: muscle weakness and muscle joint tightness, impaired timing and sequencing, abnormal tone, unbalanced muscle activation, and decreased coordination, decreased initiation, decreased attention, decreased awareness, decreased problem solving, decreased safety awareness, decreased memory, and delayed processing, and decreased sitting balance, decreased standing balance, decreased postural control, hemiplegia, and decreased balance strategies and therefore will continue to benefit from skilled OT intervention to enhance overall performance with BADL.  Patient progressing toward long term goals..  Continue plan of care.  OT Short Term Goals Week 1:  OT Short Term Goal 1 (Week 1): Pt will complete sit to stand with no lift and max A in prep for standing ADL. OT Short Term Goal 1 - Progress (Week 1): Met OT Short Term Goal 2 (Week 1): Pt will complete grooming task at sink with no more than min VCs and S. OT Short Term Goal 2 - Progress (Week 1): Met OT Short Term Goal 3 (Week 1): Pt will don shirt with max A. OT Short Term Goal 3 - Progress (Week 1): Met OT Short Term Goal 4 (Week 1): Pt will bathe UB with mod A. OT Short Term Goal 4 - Progress (Week 1): Met Week 2:  OT Short Term Goal 1 (Week 2): STG= LTG d/t ELOS   Curtis Sites 10/08/2021,  1:42 PM

## 2021-10-08 NOTE — Progress Notes (Signed)
PROGRESS NOTE   Subjective/Complaints: No new complaints this morning INR 2.6, warfarin 1mg  ordered Willingness to participate is limiting therapy Often shaking head no   ROS: Unable to assess due to aphasia   Objective:   No results found. Recent Labs    10/06/21 0042  WBC 5.1  HGB 9.1*  HCT 27.5*  PLT 462*   Recent Labs    10/06/21 0042  NA 134*  K 4.0  CL 102  CO2 21*  GLUCOSE 96  BUN 9  CREATININE 1.08*  CALCIUM 8.7*     Intake/Output Summary (Last 24 hours) at 10/08/2021 1159 Last data filed at 10/07/2021 1900 Gross per 24 hour  Intake 120 ml  Output --  Net 120 ml        Physical Exam: Vital Signs Blood pressure 118/73, pulse 60, temperature 98.2 F (36.8 C), resp. rate 16, SpO2 100 %. .Gen: no distress, normal appearing HEENT: oral mucosa pink and moist, NCAT Cardio: Reg rate Chest: normal effort, normal rate of breathing Abd: soft, non-distended Ext: no edema Psych: very flat affect Skin: intact Neurological: often shaking head no.     Mental Status: She is alert.     Comments: Pt is alert. Follows simple basic commands. Nods head "yes/no" to simple questions. Exp>rec aphasia. Did not engage much however during exam due to mood. RUE tr/5. RLE 1-2/5 prox to 0-trace distally (poor effort). Moves left arm and leg freely. Decreased pain sense Right arm and leg. Does have resting flexor tone right wrist and elbow.   GU: improving continence      Assessment/Plan: 1. Functional deficits which require 3+ hours per day of interdisciplinary therapy in a comprehensive inpatient rehab setting. Physiatrist is providing close team supervision and 24 hour management of active medical problems listed below. Physiatrist and rehab team continue to assess barriers to discharge/monitor patient progress toward functional and medical goals  Care Tool:  Bathing    Body parts bathed by patient: Face,  Left upper leg   Body parts bathed by helper: Buttocks, Front perineal area, Left lower leg, Right lower leg, Face, Abdomen, Chest, Right arm, Left arm     Bathing assist Assist Level: Total Assistance - Patient < 25%     Upper Body Dressing/Undressing Upper body dressing   What is the patient wearing?: Hospital gown only, Pull over shirt    Upper body assist Assist Level: Maximal Assistance - Patient 25 - 49%    Lower Body Dressing/Undressing Lower body dressing      What is the patient wearing?: Pants, Incontinence brief     Lower body assist Assist for lower body dressing: Total Assistance - Patient < 25% (Bed level)     Toileting Toileting    Toileting assist Assist for toileting: Dependent - Patient 0%     Transfers Chair/bed transfer  Transfers assist     Chair/bed transfer assist level: Maximal Assistance - Patient 25 - 49%     Locomotion Ambulation   Ambulation assist   Ambulation activity did not occur: Safety/medical concerns          Walk 10 feet activity   Assist  Walk 10 feet activity  did not occur: Safety/medical concerns        Walk 50 feet activity   Assist Walk 50 feet with 2 turns activity did not occur: Safety/medical concerns         Walk 150 feet activity   Assist Walk 150 feet activity did not occur: Safety/medical concerns         Walk 10 feet on uneven surface  activity   Assist Walk 10 feet on uneven surfaces activity did not occur: Safety/medical concerns         Wheelchair     Assist Is the patient using a wheelchair?: Yes Type of Wheelchair: Manual    Wheelchair assist level: Minimal Assistance - Patient > 75% Max wheelchair distance: 150    Wheelchair 50 feet with 2 turns activity    Assist        Assist Level: Minimal Assistance - Patient > 75%   Wheelchair 150 feet activity     Assist      Assist Level: Minimal Assistance - Patient > 75%   Blood pressure 118/73,  pulse 60, temperature 98.2 F (36.8 C), resp. rate 16, SpO2 100 %.  Medical Problem List and Plan: 1. 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 She'll need some motivation at times to participate  Start B/C vitamin complex  Continue CIR- PT, OT and SLP  I have rescheduled her outpatient Botox for February.  2.  Atrial fibrillation/acute renal artery thrombosis: -DVT/anticoagulation:  INR 2.6 today, give warfarin 1mg  today Pending hypercoagulable labs, primary care provider could consider transitioning back to NOAC if the work-up is negative.  Consider hematology referral as outpatient.             -antiplatelet therapy: Aspirin 81 mg daily 3. Pain Management: Tylenol 4. Situational depression: LCSW to evaluate and provide emotional support             -antipsychotic agents: N/A             -daughter supportive. Pt is unhappy about still being in the hospital. Will need encouragement and positive reinforcement from team. Discuss with daughter whether she may benefit from antidepressant. She does not want an anti-depressant so I will stop the Celexa that was started for her 5. Neuropsych: This patient is not capable of making decisions on her own behalf. 6. Skin/Wound Care: Routine skin care checks 7. Fluids/Electrolytes/Nutrition: Ins and outs and follow-up chemistries. Dys 3 diet 8.  9.  HTN: Decrease lopressor to 50mg  BID -HR elevated to 150 this morning with PT. Observe for patterns.  -consider resuming amlodipine (home med) bp permitting -magnesium low but will not supplement given AKI 10: Klebsiella positive, multifocal pyelonephritis with abscess: Infectious disease recommend transition from intravenous cefazolin to cefadroxil twice a day on discharge to complete a 4-week treatment course.   Stop date of the 10/20/2021. 11: Possible seizure activity:  --continue Keppra 12: Constipation: continue prn miralax 13: AKI: baseline Scr> 0.70. 1.74 on 12/26,  1.32 on 12/28. Monitor trend. 1/2- Cr 1.29 yesterday-  14.  Mild anemia: No indication for transfusion.  Monitor H&H.  1/2- Hb dropped slightly to 9.7 from 10.3 15. Suboptimal vitamin D: start ergocalciferol 50,000U once per week for 7 weeks.  16. Urinary incontinence: improving, continue bladder program  17. Bradycardia: decrease lopressor to 50mg  BID   LOS: 7 days A FACE TO FACE EVALUATION WAS PERFORMED  Jariel Drost P Franceska Strahm 10/08/2021, 11:59 AM

## 2021-10-09 LAB — PROTIME-INR
INR: 2.8 — ABNORMAL HIGH (ref 0.8–1.2)
Prothrombin Time: 29.3 seconds — ABNORMAL HIGH (ref 11.4–15.2)

## 2021-10-09 MED ORDER — WARFARIN SODIUM 1 MG PO TABS
1.0000 mg | ORAL_TABLET | Freq: Once | ORAL | Status: AC
  Administered 2021-10-09: 1 mg via ORAL
  Filled 2021-10-09: qty 1

## 2021-10-09 NOTE — Progress Notes (Signed)
ANTICOAGULATION CONSULT NOTE - Follow Up Consult  Pharmacy Consult for Warfarin Indication: Renal artery thrombosis, hx Afib, possible antiphospholipid antibody syndrome  No Known Allergies  Patient Measurements: Weight: 65.4 kg (144 lb 2.9 oz) Height: 5' 4.17" (163 cm)  Vital Signs: Temp: 98.1 F (36.7 C) (01/07 0540) Temp Source: Oral (01/07 0540) BP: 127/75 (01/07 0540) Pulse Rate: 66 (01/07 0540)  Labs: Recent Labs    10/07/21 0532 10/08/21 0521 10/09/21 0616  LABPROT 27.1* 27.5* 29.3*  INR 2.5* 2.6* 2.8*    Estimated Creatinine Clearance: 45.2 mL/min (A) (by C-G formula based on SCr of 1.08 mg/dL (H)).   Medications:  Scheduled:   aspirin EC  81 mg Oral Daily   atorvastatin  20 mg Oral q1800   B-complex with vitamin C  1 tablet Oral Daily   cefadroxil  1,000 mg Oral BID   diltiazem  180 mg Oral Daily   levETIRAcetam  500 mg Oral BID   metoprolol tartrate  50 mg Oral BID   Vitamin D (Ergocalciferol)  50,000 Units Oral Q7 days   Warfarin - Pharmacist Dosing Inpatient   Does not apply q1600    Assessment: 66 yo F on apixaban PTA for hx AFib and CVA, found to have acute renal artery thrombosis noted on CT abdomen 12/25. Patient was initially on heparin infusion, then transition to warfarin on 12/30 with enoxaparin bridge (12/30 >> 12/31) given new clot while on apixaban. Patient also undergoing evaluation for possible antiphospholipid antibody syndrome. Pharmacy consulted for warfarin dosing.    INR today is therapeutic at 2.8. CBC stable. No signs/symptoms of bleeding noted per RN.    Goal of Therapy:  INR 2-3 Monitor platelets by anticoagulation protocol: Yes   Plan:  Warfarin 1 mg PO x 1 Daily INR for now, change to MWF next week Monitor CBC and for signs/symptoms of bleeding    Vance Peper, PharmD PGY1 Pharmacy Resident Phone [phone] 10/09/2021 8:59 AM   Please check AMION for all [place] phone numbers After 10:00 PM, call [place]  [phone]

## 2021-10-10 LAB — PROTIME-INR
INR: 2.8 — ABNORMAL HIGH (ref 0.8–1.2)
Prothrombin Time: 29.1 seconds — ABNORMAL HIGH (ref 11.4–15.2)

## 2021-10-10 MED ORDER — WARFARIN SODIUM 1 MG PO TABS
1.0000 mg | ORAL_TABLET | Freq: Once | ORAL | Status: AC
  Administered 2021-10-10: 1 mg via ORAL
  Filled 2021-10-10: qty 1

## 2021-10-10 MED ORDER — LEVETIRACETAM 100 MG/ML PO SOLN
500.0000 mg | Freq: Two times a day (BID) | ORAL | Status: DC
  Administered 2021-10-11 – 2021-10-22 (×22): 500 mg via ORAL
  Filled 2021-10-10 (×25): qty 5

## 2021-10-10 NOTE — Progress Notes (Signed)
PROGRESS NOTE   Subjective/Complaints:   Pt shook head very strongly no when asked if she's ok- however also shook head no when asked if having pain; said slept OK.   Appeared frustrated about stroke, which is understandable.    ROS: unable to assess due to aphasia Objective:   No results found. No results for input(s): WBC, HGB, HCT, PLT in the last 72 hours.  No results for input(s): NA, K, CL, CO2, GLUCOSE, BUN, CREATININE, CALCIUM in the last 72 hours.    Intake/Output Summary (Last 24 hours) at 10/10/2021 1234 Last data filed at 10/10/2021 0753 Gross per 24 hour  Intake 297 ml  Output --  Net 297 ml        Physical Exam: Vital Signs Blood pressure 126/64, pulse 63, temperature 98.3 F (36.8 C), temperature source Oral, resp. rate 14, weight 65.4 kg, SpO2 100 %.    General: awake, alert, laying supine in bed; NT in room; NAD HENT:  oropharynx moist CV: regular rate; no JVD Pulmonary: CTA B/L; no W/R/R- good air movement GI: soft, NT, ND, (+)BS Psychiatric: very flat Neurological: nonverbal shakes head no vehemently.  Skin: intact Neurological: often shaking head no.     Mental Status: She is alert.     Comments: Pt is alert. Follows simple basic commands. Nods head "yes/no" to simple questions. Exp>rec aphasia. Did not engage much however during exam due to mood. RUE tr/5. RLE 1-2/5 prox to 0-trace distally (poor effort). Moves left arm and leg freely. Decreased pain sense Right arm and leg. Does have resting flexor tone right wrist and elbow.   GU: improving continence      Assessment/Plan: 1. Functional deficits which require 3+ hours per day of interdisciplinary therapy in a comprehensive inpatient rehab setting. Physiatrist is providing close team supervision and 24 hour management of active medical problems listed below. Physiatrist and rehab team continue to assess barriers to discharge/monitor  patient progress toward functional and medical goals  Care Tool:  Bathing    Body parts bathed by patient: Face, Left upper leg   Body parts bathed by helper: Buttocks, Front perineal area, Left lower leg, Right lower leg, Face, Abdomen, Chest, Right arm, Left arm     Bathing assist Assist Level: Total Assistance - Patient < 25%     Upper Body Dressing/Undressing Upper body dressing   What is the patient wearing?: Hospital gown only, Pull over shirt    Upper body assist Assist Level: Maximal Assistance - Patient 25 - 49%    Lower Body Dressing/Undressing Lower body dressing      What is the patient wearing?: Pants, Incontinence brief     Lower body assist Assist for lower body dressing: Total Assistance - Patient < 25% (Bed level)     Toileting Toileting    Toileting assist Assist for toileting: Dependent - Patient 0%     Transfers Chair/bed transfer  Transfers assist     Chair/bed transfer assist level: Maximal Assistance - Patient 25 - 49%     Locomotion Ambulation   Ambulation assist   Ambulation activity did not occur: Safety/medical concerns  Walk 10 feet activity   Assist  Walk 10 feet activity did not occur: Safety/medical concerns        Walk 50 feet activity   Assist Walk 50 feet with 2 turns activity did not occur: Safety/medical concerns         Walk 150 feet activity   Assist Walk 150 feet activity did not occur: Safety/medical concerns         Walk 10 feet on uneven surface  activity   Assist Walk 10 feet on uneven surfaces activity did not occur: Safety/medical concerns         Wheelchair     Assist Is the patient using a wheelchair?: Yes Type of Wheelchair: Manual    Wheelchair assist level: Minimal Assistance - Patient > 75% Max wheelchair distance: 150    Wheelchair 50 feet with 2 turns activity    Assist        Assist Level: Minimal Assistance - Patient > 75%   Wheelchair  150 feet activity     Assist      Assist Level: Minimal Assistance - Patient > 75%   Blood pressure 126/64, pulse 63, temperature 98.3 F (36.8 C), temperature source Oral, resp. rate 14, weight 65.4 kg, SpO2 100 %.  Medical Problem List and Plan: 1. 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 She'll need some motivation at times to participate  Start B/C vitamin complex  Continue CIR- PT, OT and SLP  I have rescheduled her outpatient Botox for February.   Continue CIR- PT, OT and SLPcon't to encourage to participate 2.  Atrial fibrillation/acute renal artery thrombosis: -DVT/anticoagulation:  INR 2.6 today, give warfarin 1mg  today 1/8- INR 2.8- per pharmacy Pending hypercoagulable labs, primary care provider could consider transitioning back to NOAC if the work-up is negative.  Consider hematology referral as outpatient.             -antiplatelet therapy: Aspirin 81 mg daily 3. Pain Management: Tylenol  1/8- denies pain- con't regimen 4. Situational depression: LCSW to evaluate and provide emotional support             -antipsychotic agents: N/A             -daughter supportive. Pt is unhappy about still being in the hospital. Will need encouragement and positive reinforcement from team. Discuss with daughter whether she may benefit from antidepressant. She does not want an anti-depressant so I will stop the Celexa that was started for her 5. Neuropsych: This patient is not capable of making decisions on her own behalf. 6. Skin/Wound Care: Routine skin care checks 7. Fluids/Electrolytes/Nutrition: Ins and outs and follow-up chemistries. Dys 3 diet 8.  9.  HTN: Decrease lopressor to 50mg  BID -HR elevated to 150 this morning with PT. Observe for patterns.  -consider resuming amlodipine  (home med) bp permitting -magnesium low but will not supplement given AKI  1/8- BP controlled- con't regimen 10: Klebsiella positive, multifocal pyelonephritis with abscess: Infectious disease recommend transition from intravenous cefazolin to cefadroxil twice a day on discharge to complete a 4-week treatment course.  Stop date of the 10/20/2021. 11: Possible seizure activity:  --continue Keppra 12: Constipation: continue prn miralax 13: AKI: baseline Scr> 0.70. 1.74  on 12/26,  1.32 on 12/28. Monitor trend. 1/2- Cr 1.29 yesterday-  1/8- labs monday 14.  Mild anemia: No indication for transfusion.  Monitor H&H.  1/2- Hb dropped slightly to 9.7 from 10.3 15. Suboptimal vitamin D: start ergocalciferol 50,000U once per week for 7 weeks. 16. Urinary incontinence: improving, continue bladder program  17. Bradycardia: decrease lopressor to 50mg  BID  1/8- HR controlled-- con't regimen   LOS: 9 days A FACE TO FACE EVALUATION WAS PERFORMED  Rickey Farrier 10/10/2021, 12:34 PM

## 2021-10-10 NOTE — Progress Notes (Signed)
Occupational Therapy Session Note  Patient Details  Name: Lindsey [name] MRN: [id number] Date of Birth: 08-10-1956  Today's Date: 10/10/2021 OT Individual Time: 2395-3202 OT Individual Time Calculation (min): 70 min    Short Term Goals: Week 1:  OT Short Term Goal 1 (Week 1): Pt will complete sit to stand with no lift and max A in prep for standing ADL. OT Short Term Goal 1 - Progress (Week 1): Met OT Short Term Goal 2 (Week 1): Pt will complete grooming task at sink with no more than min VCs and S. OT Short Term Goal 2 - Progress (Week 1): Met OT Short Term Goal 3 (Week 1): Pt will don shirt with max A. OT Short Term Goal 3 - Progress (Week 1): Met OT Short Term Goal 4 (Week 1): Pt will bathe UB with mod A. OT Short Term Goal 4 - Progress (Week 1): Met  Skilled Therapeutic Interventions/Progress Updates:    Pt resting in w/c upon arrival. Grandson present. Pt shaking head "no" when informed that it was time for more therapy. Grandson encouraging. Although pt did shake her head "no" throughout session when asked yes not questions, she did not resist any interventions. Pt tolerated 1-2 mins of focused activity before becoming distracted (internally and externally). Pt frequently attended to fingernails after approx 1-2 mins of focused activity. Therapeutic activities included: BITS - scanning for dots and sequencing numbers (mod demonstration/verbal cues for scanning and encouragement to reach) Colored peg board-max demonstration/verbal cues to complete Following one step commands with emphasis on color recognition, handing bean bags to OTA-max demonstration/verbal cues  Pt returned to room and remained in w/c. RUE splint in place, half lap tray in place, and belt alarm activated. All needs within reach and grandson present.   Therapy Documentation Precautions:  Precautions Precautions: Fall, Other (comment) Precaution Comments: Baseline R-side hemiplegia; global aphasia Required Braces  or Orthoses: Other Brace, Splint/Cast Splint/Cast: Baseline RUE resting hand splint Other Brace: Baseline R AFO for ambulation Restrictions Weight Bearing Restrictions: No   Pain:     Therapy/Group: Individual Therapy  Leroy Libman 10/10/2021, 12:02 PM

## 2021-10-10 NOTE — Progress Notes (Addendum)
ANTICOAGULATION CONSULT NOTE - Follow Up Consult  Pharmacy Consult for Warfarin Indication: Renal artery thrombosis, hx Afib, possible antiphospholipid antibody syndrome  No Known Allergies  Patient Measurements: Weight: 65.4 kg (144 lb 2.9 oz) Height: 5' 4.17" (163 cm)  Vital Signs: Temp: 98.3 F (36.8 C) (01/08 0420) Temp Source: Oral (01/08 0420) BP: 126/64 (01/08 0420) Pulse Rate: 63 (01/08 0753)  Labs: Recent Labs    10/08/21 0521 10/09/21 0616 10/10/21 0549  LABPROT 27.5* 29.3* 29.1*  INR 2.6* 2.8* 2.8*     Estimated Creatinine Clearance: 45.2 mL/min (A) (by C-G formula based on SCr of 1.08 mg/dL (H)).   Medications:  Scheduled:   aspirin EC  81 mg Oral Daily   atorvastatin  20 mg Oral q1800   B-complex with vitamin C  1 tablet Oral Daily   cefadroxil  1,000 mg Oral BID   diltiazem  180 mg Oral Daily   levETIRAcetam  500 mg Oral BID   metoprolol tartrate  50 mg Oral BID   Vitamin D (Ergocalciferol)  50,000 Units Oral Q7 days   Warfarin - Pharmacist Dosing Inpatient   Does not apply q1600    Assessment: 66 yo F on apixaban PTA for hx AFib and CVA, found to have acute renal artery thrombosis noted on CT abdomen 12/25. Patient was initially on heparin infusion, then transition to warfarin on 12/30 with enoxaparin bridge (12/30 >> 12/31) given new clot while on apixaban. Patient also undergoing evaluation for possible antiphospholipid antibody syndrome. Pharmacy consulted for warfarin dosing.    INR today is therapeutic at 2.8. CBC stable. No signs/symptoms of bleeding noted per RN.    Goal of Therapy:  INR 2-3 Monitor platelets by anticoagulation protocol: Yes   Plan:  Warfarin 1 mg PO x 1 Daily INR for now, change to MWF next week Monitor CBC and for signs/symptoms of bleeding    Vance Peper, PharmD PGY1 Pharmacy Resident Phone [phone] 10/10/2021 9:00 AM   Please check AMION for all [facility] phone numbers After 10:00 PM, call [place]  [phone]

## 2021-10-10 NOTE — Progress Notes (Signed)
°   10/10/21 1702  Urine Characteristics  Bladder Scan Volume (mL) 119 mL

## 2021-10-10 NOTE — Progress Notes (Signed)
Physical Therapy Session Note  Patient Details  Name: Lindsey [name] MRN: [id number] Date of Birth: 02/22/1956  Today's Date: 10/10/2021 PT Individual Time: 0801-0904 PT Individual Time Calculation (min): 63 min   Short Term Goals: Week 1:  PT Short Term Goal 1 (Week 1): Patient will perform bed mobility with min A >50% of the time without use of hospital bed features. PT Short Term Goal 2 (Week 1): Patient will perform basic transfers with min A >50% of the time using LRAD. PT Short Term Goal 3 (Week 1): Patient will ambulate >50 feet with min A using LRAD. PT Short Term Goal 4 (Week 1): Patient will improved Berg Balance Scale by 7 points to meed MCD.      Skilled Therapeutic Interventions/Progress Updates:  Pt resting in bed.  She denied pain.  Initially pt declined participating, but with increasing rapport with PT, she participated.  neuromuscular re-education via demo and multimodal cues for supine- RLE mass flexion/extension, bil lower trunk rotation, R toe extension.  PROM R hip, knee and ankle. Seated EOB- R lateral leans.  Pt indicated that she was wet when PT attempted to don pants.  Rolling L using bed features with min assist, and rolling R with mod assist, to enable peri care.  Pt required mod assist for peri care with wet wash cloths.  Pt bridged and rolled to assist PT with donning clean brief.  Pt pulled pants over hips with max assist in bed, once threaded up over bil knees.  Supine> sit without use of rail, flat bed, min/mod assist. Squat pivot transfer to L bed> wc slightly downhill, mod assist.  Scooting back into wc with reciprocal scooting, max assist.  Pt combed hair in sitting, at sink, with max cues to attend to the R and back areas of her head. She donned L shoe with mod assist and L AFO and shoe with max assist.  Sit> stand using L hand to pull up on footboard of bed, min asssist.  Standing during wt shifting L><R x 1 minute, min assist.  Pt declined another  trial.  At end of session, pt seated in wc with needs at hand and seat belt alarm set     Therapy Documentation Precautions:  Precautions Precautions: Fall, Other (comment) Precaution Comments: Baseline R-side hemiplegia; global aphasia Required Braces or Orthoses: Other Brace, Splint/Cast Splint/Cast: Baseline RUE resting hand splint Other Brace: Baseline R AFO for ambulation Restrictions Weight Bearing Restrictions: No        Therapy/Group: Individual Therapy  Dahlia Nifong 10/10/2021, 10:29 AM

## 2021-10-11 LAB — PROTIME-INR
INR: 2.9 — ABNORMAL HIGH (ref 0.8–1.2)
Prothrombin Time: 30.3 seconds — ABNORMAL HIGH (ref 11.4–15.2)

## 2021-10-11 MED ORDER — METOPROLOL TARTRATE 50 MG PO TABS
62.5000 mg | ORAL_TABLET | Freq: Two times a day (BID) | ORAL | Status: DC
  Administered 2021-10-11 – 2021-10-12 (×2): 62.5 mg via ORAL
  Filled 2021-10-11 (×2): qty 1

## 2021-10-11 MED ORDER — WARFARIN SODIUM 1 MG PO TABS
1.0000 mg | ORAL_TABLET | Freq: Every day | ORAL | Status: DC
  Administered 2021-10-11: 1 mg via ORAL
  Filled 2021-10-11: qty 1

## 2021-10-11 NOTE — Progress Notes (Signed)
ANTICOAGULATION CONSULT NOTE - Follow Up Consult  Pharmacy Consult for Coumadin Indication:  CVA + Acute renal artery thrombosis   No Known Allergies  Patient Measurements: Weight: 65.4 kg (144 lb 2.9 oz)  Vital Signs: Temp: 98 F (36.7 C) (01/09 0437) Temp Source: Oral (01/09 0437) BP: 155/64 (01/09 0437) Pulse Rate: 64 (01/09 0437)  Labs: Recent Labs    10/09/21 0616 10/10/21 0549 10/11/21 0540  LABPROT 29.3* 29.1* 30.3*  INR 2.8* 2.8* 2.9*    Estimated Creatinine Clearance: 45.2 mL/min (A) (by C-G formula based on SCr of 1.08 mg/dL (H)).   Assessment: Anticoag: Recent CVA and h/o AFib > on apix PTA.  Acute renal artery thrombosis >> hep gtt while PTA apix held >> switching AC to warfarin w/ enox bridge in the setting of new clot while on apix and concern for APS >> warfarin - 12/26: Lupus anticoagulant elevated while on IV heparin. Results NOT valid for patient receiving IV heparin at that time). Anticardiolipin ab elevated (true result), Beta 2 glycoprotein negative - INR 2.9   Goal of Therapy:  INR 2-3 Monitor platelets by anticoagulation protocol: Yes   Plan:  -Warfarin 1 mg PO q1800 -Daily INR, CBC qWed   Alexsys Eskin S. Alford Highland, PharmD, BCPS Clinical Staff Pharmacist [email or web address]  Alford Highland, Hadia Minier Stillinger 10/11/2021,8:54 AM

## 2021-10-11 NOTE — Progress Notes (Signed)
Physical Therapy Session Note  Patient Details  Name: Lindsey [name] MRN: [id number] Date of Birth: 1956/01/19  Today's Date: 10/11/2021 PT Individual Time: 1000-1055 PT Individual Time Calculation (min): 55 min   Short Term Goals: Week 1:  PT Short Term Goal 1 (Week 1): Patient will perform bed mobility with min A >50% of the time without use of hospital bed features. PT Short Term Goal 2 (Week 1): Patient will perform basic transfers with min A >50% of the time using LRAD. PT Short Term Goal 3 (Week 1): Patient will ambulate >50 feet with min A using LRAD. PT Short Term Goal 4 (Week 1): Patient will improved Berg Balance Scale by 7 points to meed MCD.   Skilled Therapeutic Interventions/Progress Updates:     Patient in w/c with family and RN in the room upon PT arrival. Patient alert and agreeable to PT session. Patient denied pain during session. Utilized nodding yes/no for communication throughout session, did verbalize one word responses x3. RN preparing patient for toilet transfer and handed patient of to PT.   R AFO and tennis shoes donned prior to and throughout session for mobility.   Therapeutic Activity: Transfers: Patient performed stand pivot w/c<>BSC over the toilet without an AD with min-mod A to the R and in A to the L. She performed sit to/from stand holding therapist's shoulder from the [facility] x2 with min A for lower body clothing management and peri-care with total A. She performed sit to/from stand x1 using a L rail with min A-CGA. Provided verbal cues for R foot placement, forward weight shift, and use of L hand to reach for opposite arm rest during stand pivot from [facility].  Gait Training:  Patient ambulated 30 feet using L rail with min A, took 4 steps letting go of the rail and holding onto therapist's shoulder, but returned her hand to the rail for fear of falling. Patient completed 90 degree turn at the rail to attempt walking with support of therapist only back to her w/c  when PT noted blood dripping from patient's IV site. +2 assist for w/c to be brought up and patient returned to her room and RN notified. Patient ambulated with min facilitation for weight shift, max cues for attention to task, notable external distraction causing pausing and decreased gait speed throughout, patient able to clear L foot independently with facilitation of L weight shift. Provided verbal cues for erect posture, attention to task, weight shifting, and heel at initial contact for foot clearance and reciprocal stepping.  Upon returning to the room RN removed patient's IV and applied appropriate dressing. Patient denied pain with incident. Offered patient to change close, but only had option of paper scrubs and patient declined. Cleaned blood out of her pants with soap and water as PT discussed PLOF before this admission and progression with gait with OPPT after last admission. Patients family reports that the patient only use a RW in therapy and only began walking at home when she progressed to a quad cane. Discussed progression with quad cane or hemi-walker and patient in agreement to trials with these ADs. Lead PT made aware following session.  Patient in w/c with family in the room at end of session with breaks locked, seat belt alarm set, and all needs within reach.   Therapy Documentation Precautions:  Precautions Precautions: Fall, Other (comment) Precaution Comments: Baseline R-side hemiplegia; global aphasia Required Braces or Orthoses: Other Brace, Splint/Cast Splint/Cast: Baseline RUE resting hand splint Other Brace: Baseline R  AFO for ambulation Restrictions Weight Bearing Restrictions: No   Therapy/Group: Individual Therapy  Zhaire Locker L Koehn Salehi PT, DPT  10/11/2021, 4:56 PM

## 2021-10-11 NOTE — Progress Notes (Signed)
Physical Therapy Weekly Progress Note  Patient Details  Name: Lindsey [name] MRN: [id number] Date of Birth: April 24, 1956  Beginning of progress report period: October 02, 2021 End of progress report period: October 11, 2021  Today's Date: 10/11/2021 PT Individual Time: 1115-1155 + 1415-1440 PT Individual Time Calculation (min): 40 min + 25 min  Patient has met 0 of 4 short term goals. Pt making slow progress towards LTG, this reporting period. Overall, she required modA for bed mobility, min to modA for sit<>stand transfers, min to modA for squat<>pivot or stand<>pivot transfers, and is ambulating `~1ft with min/modA using L hand rail and +2 assist for w/c follow. Her assist level fluctuates due to apathy and lack of participation. With full attention and effort, she can be as good as minA but otherwise requires modA. Her primary deficits are baseline R sided weakness, L sided incoordination, significant expressive aphasia, and motor apraxia. Family has been made aware of her lack of participation during therapies and they have assisted with motivating/encouraging her but this has limited carryover.   Patient continues to demonstrate the following deficits muscle weakness and muscle joint tightness, decreased cardiorespiratoy endurance, abnormal tone, unbalanced muscle activation, motor apraxia, and decreased coordination, decreased motor planning, decreased initiation, decreased attention, decreased awareness, decreased problem solving, and delayed processing, and decreased sitting balance, decreased standing balance, decreased postural control, hemiplegia, and decreased balance strategies and therefore will continue to benefit from skilled PT intervention to increase functional independence with mobility.  Patient progressing toward long term goals..  Plan of care revisions: Downgraded LTG to minA overall, DC stair goal,  decreased gait and wheelchair distances. See POC note..  PT Short Term  Goals Week 2:  PT Short Term Goal 1 (Week 2): STG = LTG due to ELOS  Skilled Therapeutic Interventions/Progress Updates:      1st session: Pt sitting in w/c with daughter at bedside. Pt shaking her head 'no' to PT treatment but with daughter encouraging, pt was ulitmatelly agreeable. Focused session on family education to prepare for upcoming DC this Friday, 1/13. Had lengthy conversation regarding PT goals, PT POC, pt's current mobility status, anticipated level of assist at DC, barriers towards progress (apathy/motivation), 24/7 care with assist from grandson's at night while daughter works 3rd shift, etc. Pt wheeled to day room rehab gym for time. Practiced functional transfers from w/c to mat table, completed x2 with PT providing minA for transfering L and modA for transferring R. Daughter actively observing and completed x1 transfer with PT providing CGA with pt transferring to her L. Daughter does well in guarding and providing verbal cueing although pt had difficulty with taking "steps" to turn to sit. Ed on squat pivot vs stand pivot transfers. Pt then instructed in gait training while using wide based QC where she ambulated ~35ft with modA with +2 assist for w/c follow for safety. With gait, demo's step to pattern without evidence of knee buckling and PT primarily facilitating lateral weight shift with mod instructional cues for sequencing. Pt then instructed in hemi technique for w/c propulsion - she used her LLE only (baseline, per daughter), and could propel herself ~7ft with max encouragement for effort, on level tile. Pt returned to her room at end of session and remained seated in w/c with safety belt alarm on, all needs, met, daughter at bedside. Informed of upcoming therapy schedule and planned for family ed on Wednesday with daughter to review car transfers and more functional bed<>chair transfers.   2nd session: Pt sitting in  w.c to start session - shaking head 'no' to PT treatment but  agreeable with convincing. Focused session on functional gait training using wide based QC. She ambulated ~73f + ~730f+ `67f21f ~4ft41f~6ft 56fh modA and +2 assist for w/c follow. Required max instructional cues for gait sequencing "cane, R, L" and facilitation for lateral weight shift, initiating RLE swing, and initiating cane advancement. Pt with some motor planning deficits impacting sequencing, also inhibited by external distractions. She remains apathetic and requires max coaxing to participate throughout session. Concluded session seated in w/c with safety belt alarm on, RUE supported with lap tray, all needs within reach.    Therapy Documentation Precautions:  Precautions Precautions: Fall, Other (comment) Precaution Comments: Baseline R-side hemiplegia; global aphasia Required Braces or Orthoses: Other Brace, Splint/Cast Splint/Cast: Baseline RUE resting hand splint Other Brace: Baseline R AFO for ambulation Restrictions Weight Bearing Restrictions: No General:    Therapy/Group: Individual Therapy  Jael Kostick P Elizaveta Mattice PT 10/11/2021, 7:37 AM

## 2021-10-11 NOTE — Progress Notes (Signed)
Speech Language Pathology Daily Session Note  Patient Details  Name: Lindsey [name] MRN: [id number] Date of Birth: Jul 20, 1956  Today's Date: 10/11/2021 SLP Individual Time: 0916-1000 SLP Individual Time Calculation (min): 44 min  Short Term Goals: Week 2: SLP Short Term Goal 1 (Week 2): Patient will name common objects with phrase completion and printed word cues, with modA verbal cues for 75% accuracy SLP Short Term Goal 2 (Week 2): Patient will respond to mildly complex yes/no questions verbally or with head nod/shake with 80% accuracy and modA cues to respond. SLP Short Term Goal 3 (Week 2): Patient will consume trials of regular solids snacks with SLP with no more than mild amount of oral residuals and modA cues to clear. SLP Short Term Goal 4 (Week 2): Patient will imitate to produce functional phrases (help me, etc) with maxA verbal modeling cues to perform. SLP Short Term Goal 5 (Week 2): Patient will comment and/or request help either verbally or via gestures with modA verbal, gestural cues to initiate.  Skilled Therapeutic Interventions:Skilled ST services focused on education and language skills. Pt's daughter was present for treatment session. Pt demonstrated increase in skills level and participation as the session continued with mod A verbal encouragement. Pt's daughter supported pt using a lingraphica AAC device at home to express wants/needs with ability to select from more than 10 photos on a page. SLP requested daughter bring in device to assess functional communication on personal device with acute deficits. Pt was able to select photos of objects in a field of 2 then 3 with 80% accuracy and then select action photos in a field of 3 with 80% accuracy. Pt was able to name objects with cloze phrase in 8 out 9 opportunities with moderate phonetic distortions. Pt was able to imitate at word level object name in 10/10 opportunities with max phonetic errors. Pt was able to verbalize yes/no  to indicate object name with 70% accuracy given photo pf object. SLP began education with pt's daughter pertaining to use of yes/no response, use of cloze phrases, selecting wants/needs given photos and encouraging initiation of multimodal communication. Continued education is recommended. Pt was left in room with daughter, call bell within reach and chair alarm set. SLP recommends to continue skilled services.     Pain Pain Assessment Pain Score: 0-No pain  Therapy/Group: Individual Therapy  Jareli Highland  [facility] 10/11/2021, 3:39 PM

## 2021-10-11 NOTE — Discharge Instructions (Addendum)
Inpatient Rehab Discharge Instructions  Sonnia Lukowski Discharge date and time: 10/22/2021  Activities/Precautions/ Functional Status: Activity: no lifting, driving, or strenuous exercise for   until cleared by MD Diet: cardiac diet Wound Care: none needed Functional status:  ___ No restrictions     ___ Walk up steps independently __x_ 24/7 supervision/assistance   ___ Walk up steps with assistance ___ Intermittent supervision/assistance  ___ Bathe/dress independently ___ Walk with walker     __x_ Bathe/dress with assistance ___ Walk Independently    ___ Shower independently __x_ Walk with assistance    ___ Shower with assistance __x_ No alcohol     ___ Return to work/school ________  Special Instructions:  No driving, alcohol consumption or tobacco use.    COMMUNITY REFERRALS UPON DISCHARGE:     Outpatient: PT   OT    SP             Agency:[facility] [place] [address] Phone:[phone]              Appointment Date/Time:WILL CONTACT DAUGHTER TO ARRANGE APPOINTMENTS  Medical Equipment/Items Ordered: HAS ALL NEEDED EQUIPMENT FROM PAST ADMISSION                                                 Agency/Supplier:NA  PCS REFERRAL MADE FOR AIDE SERVICES WILL FOLLOW UP WITH DAUGHTER    STROKE/TIA DISCHARGE INSTRUCTIONS SMOKING Cigarette smoking nearly doubles your risk of having a stroke & is the single most alterable risk factor  If you smoke or have smoked in the last 12 months, you are advised to quit smoking for your health. Most of the excess cardiovascular risk related to smoking disappears within a year of stopping. Ask you doctor about anti-smoking medications [place] Quit Line: 1-800-QUIT NOW Free Smoking Cessation Classes [phone]  CHOLESTEROL Know your levels; limit fat & cholesterol in your diet  Lipid Panel     Component Value Date/Time   CHOL 113 09/23/2021 0233   CHOL 87 (L) 11/02/2020 0926   TRIG 49 09/23/2021 0233    HDL 42 09/23/2021 0233   HDL 41 11/02/2020 0926   CHOLHDL 2.7 09/23/2021 0233   VLDL 10 09/23/2021 0233   LDLCALC 61 09/23/2021 0233   LDLCALC 32 11/02/2020 0926     Many patients benefit from treatment even if their cholesterol is at goal. Goal: Total Cholesterol (CHOL) less than 160 Goal:  Triglycerides (TRIG) less than 150 Goal:  HDL greater than 40 Goal:  LDL (LDLCALC) less than 100   BLOOD PRESSURE American Stroke Association blood pressure target is less that 120/80 mm/Hg  Your discharge blood pressure is:  BP: (!) 115/58 Monitor your blood pressure Limit your salt and alcohol intake Many individuals will require more than one medication for high blood pressure  DIABETES (A1c is a blood sugar average for last 3 months) Goal HGBA1c is under 7% (HBGA1c is blood sugar average for last 3 months)  Diabetes: No known diagnosis of diabetes    Lab Results  Component Value Date   HGBA1C 5.7 (H) 09/23/2021    Your HGBA1c can be lowered with medications, healthy diet, and exercise. Check your blood sugar as directed by your physician Call your physician if you experience unexplained or low blood sugars.  PHYSICAL ACTIVITY/REHABILITATION Goal is 30 minutes at least 4  days per week  Activity: Walk with assistance, Therapies: Physical Therapy: Home Health, Occupational Therapy: Home Health, and Speech Therapy: Home Health Return to work: n/a Activity decreases your risk of heart attack and stroke and makes your heart stronger.  It helps control your weight and blood pressure; helps you relax and can improve your mood. Participate in a regular exercise program. Talk with your doctor about the best form of exercise for you (dancing, walking, swimming, cycling).  DIET/WEIGHT Goal is to maintain a healthy weight  Your discharge diet is:  Diet Order             DIET DYS 3 Room service appropriate? Yes; Fluid consistency: Thin  Diet effective now                  thin liquids Your  height is:    Your current weight is: Weight: 65.4 kg Your Body Mass Index (BMI) is:  BMI (Calculated): 24.62 Following the type of diet specifically designed for you will help prevent another stroke. Your goal weight range is:   Your goal Body Mass Index (BMI) is 19-24. Healthy food habits can help reduce 3 risk factors for stroke:  High cholesterol, hypertension, and excess weight.  RESOURCES Stroke/Support Group:  Call [phone]   STROKE EDUCATION PROVIDED/REVIEWED AND GIVEN TO PATIENT Stroke warning signs and symptoms How to activate emergency medical system (call 911). Medications prescribed at discharge. Need for follow-up after discharge. Personal risk factors for stroke. Pneumonia vaccine given: No Flu vaccine given: No My questions have been answered, the writing is legible, and I understand these instructions.  I will adhere to these goals & educational materials that have been provided to me after my discharge from the hospital.       My questions have been answered and I understand these instructions. I will adhere to these goals and the provided educational materials after my discharge from the hospital.  Patient/Caregiver Signature _______________________________ Date __________  Clinician Signature _______________________________________ Date __________  Please bring this form and your medication list with you to all your follow-up doctor's appointments.        Information on my medicine - Coumadin   (Warfarin)  Why was Coumadin prescribed for you? Coumadin was prescribed for you because you have a blood clot or a medical condition that can cause an increased risk of forming blood clots. Blood clots can cause serious health problems by blocking the flow of blood to the heart, lung, or brain. Coumadin can prevent harmful blood clots from forming. As a reminder your indication for Coumadin is:  Blood Clotting Disorder  What test will check on my response to  Coumadin? While on Coumadin (warfarin) you will need to have an INR test regularly to ensure that your dose is keeping you in the desired range. The INR (international normalized ratio) number is calculated from the result of the laboratory test called prothrombin time (PT).  If an INR APPOINTMENT HAS NOT ALREADY BEEN MADE FOR YOU please schedule an appointment to have this lab work done by your health care provider within 7 days. Your INR goal is usually a number between:  2 to 3 or your provider may give you a more narrow range like 2-2.5.  Ask your health care provider during an office visit what your goal INR is.  What  do you need to  know  About  COUMADIN? Take Coumadin (warfarin) exactly as prescribed by your healthcare provider about the same time each  day.  DO NOT stop taking without talking to the doctor who prescribed the medication.  Stopping without other blood clot prevention medication to take the place of Coumadin may increase your risk of developing a new clot or stroke.  Get refills before you run out.  What do you do if you miss a dose? If you miss a dose, take it as soon as you remember on the same day then continue your regularly scheduled regimen the next day.  Do not take two doses of Coumadin at the same time.  Important Safety Information A possible side effect of Coumadin (Warfarin) is an increased risk of bleeding. You should call your healthcare provider right away if you experience any of the following: Bleeding from an injury or your nose that does not stop. Unusual colored urine (red or dark brown) or unusual colored stools (red or black). Unusual bruising for unknown reasons. A serious fall or if you hit your head (even if there is no bleeding).  Some foods or medicines interact with Coumadin (warfarin) and might alter your response to warfarin. To help avoid this: Eat a balanced diet, maintaining a consistent amount of Vitamin K. Notify your provider about major  diet changes you plan to make. Avoid alcohol or limit your intake to 1 drink for women and 2 drinks for men per day. (1 drink is 5 oz. wine, 12 oz. beer, or 1.5 oz. liquor.)  Make sure that ANY health care provider who prescribes medication for you knows that you are taking Coumadin (warfarin).  Also make sure the healthcare provider who is monitoring your Coumadin knows when you have started a new medication including herbals and non-prescription products.  Coumadin (Warfarin)  Major Drug Interactions  Increased Warfarin Effect Decreased Warfarin Effect  Alcohol (large quantities) Antibiotics (esp. Septra/Bactrim, Flagyl, Cipro) Amiodarone (Cordarone) Aspirin (ASA) Cimetidine (Tagamet) Megestrol (Megace) NSAIDs (ibuprofen, naproxen, etc.) Piroxicam (Feldene) Propafenone (Rythmol SR) Propranolol (Inderal) Isoniazid (INH) Posaconazole (Noxafil) Barbiturates (Phenobarbital) Carbamazepine (Tegretol) Chlordiazepoxide (Librium) Cholestyramine (Questran) Griseofulvin Oral Contraceptives Rifampin Sucralfate (Carafate) Vitamin K   Coumadin (Warfarin) Major Herbal Interactions  Increased Warfarin Effect Decreased Warfarin Effect  Garlic Ginseng Ginkgo biloba Coenzyme Q10 Green tea St. Johns wort    Coumadin (Warfarin) FOOD Interactions  Eat a consistent number of servings per week of foods HIGH in Vitamin K (1 serving =  cup)  Collards (cooked, or boiled & drained) Kale (cooked, or boiled & drained) Mustard greens (cooked, or boiled & drained) Parsley *serving size only =  cup Spinach (cooked, or boiled & drained) Swiss chard (cooked, or boiled & drained) Turnip greens (cooked, or boiled & drained)  Eat a consistent number of servings per week of foods MEDIUM-HIGH in Vitamin K (1 serving = 1 cup)  Asparagus (cooked, or boiled & drained) Broccoli (cooked, boiled & drained, or raw & chopped) Brussel sprouts (cooked, or boiled & drained) *serving size only =   cup Lettuce, raw (green leaf, endive, romaine) Spinach, raw Turnip greens, raw & chopped   These websites have more information on Coumadin (warfarin):  FailFactory.se; VeganReport.com.au;

## 2021-10-11 NOTE — Progress Notes (Signed)
PROGRESS NOTE   Subjective/Complaints: She would like to go home as soon as possible. Discussed with her daughter at bedside and she is understanding of this. Discussed that with coumadin she will need follow-up at Coumadin clinic  ROS: unable to assess due to aphasia Objective:   No results found. No results for input(s): WBC, HGB, HCT, PLT in the last 72 hours.  No results for input(s): NA, K, CL, CO2, GLUCOSE, BUN, CREATININE, CALCIUM in the last 72 hours.    Intake/Output Summary (Last 24 hours) at 10/11/2021 1212 Last data filed at 10/11/2021 0900 Gross per 24 hour  Intake 448 ml  Output --  Net 448 ml        Physical Exam: Vital Signs Blood pressure (!) 155/64, pulse 64, temperature 98 F (36.7 C), temperature source Oral, resp. rate 16, weight 65.4 kg, SpO2 96 %. Gen: no distress, normal appearing HEENT: oral mucosa pink and moist, NCAT Cardio: Reg rate Chest: normal effort, normal rate of breathing Abd: soft, non-distended Ext: no edema Psychiatric: very flat Neurological: nonverbal shakes head no vehemently.  Skin: intact Neurological: often shaking head no.     Mental Status: She is alert.     Comments: Pt is alert. Follows simple basic commands. Nods head "yes/no" to simple questions. Exp>rec aphasia. Did not engage much however during exam due to mood. RUE tr/5. RLE 1-2/5 prox to 0-trace distally (poor effort). Moves left arm and leg freely. Decreased pain sense Right arm and leg. Does have resting flexor tone right wrist and elbow.   GU: improving continence      Assessment/Plan: 1. Functional deficits which require 3+ hours per day of interdisciplinary therapy in a comprehensive inpatient rehab setting. Physiatrist is providing close team supervision and 24 hour management of active medical problems listed below. Physiatrist and rehab team continue to assess barriers to discharge/monitor patient  progress toward functional and medical goals  Care Tool:  Bathing    Body parts bathed by patient: Face, Left upper leg   Body parts bathed by helper: Buttocks, Front perineal area, Left lower leg, Right lower leg, Face, Abdomen, Chest, Right arm, Left arm     Bathing assist Assist Level: Total Assistance - Patient < 25%     Upper Body Dressing/Undressing Upper body dressing   What is the patient wearing?: Pull over shirt    Upper body assist Assist Level: Moderate Assistance - Patient 50 - 74%    Lower Body Dressing/Undressing Lower body dressing      What is the patient wearing?: Pants     Lower body assist Assist for lower body dressing: Maximal Assistance - Patient 25 - 49%     Toileting Toileting    Toileting assist Assist for toileting: Dependent - Patient 0%     Transfers Chair/bed transfer  Transfers assist     Chair/bed transfer assist level: Moderate Assistance - Patient 50 - 74%     Locomotion Ambulation   Ambulation assist   Ambulation activity did not occur: Safety/medical concerns          Walk 10 feet activity   Assist  Walk 10 feet activity did not occur: Safety/medical concerns  Walk 50 feet activity   Assist Walk 50 feet with 2 turns activity did not occur: Safety/medical concerns         Walk 150 feet activity   Assist Walk 150 feet activity did not occur: Safety/medical concerns         Walk 10 feet on uneven surface  activity   Assist Walk 10 feet on uneven surfaces activity did not occur: Safety/medical concerns         Wheelchair     Assist Is the patient using a wheelchair?: Yes Type of Wheelchair: Manual    Wheelchair assist level: Minimal Assistance - Patient > 75% Max wheelchair distance: 150    Wheelchair 50 feet with 2 turns activity    Assist        Assist Level: Minimal Assistance - Patient > 75%   Wheelchair 150 feet activity     Assist      Assist  Level: Minimal Assistance - Patient > 75%   Blood pressure (!) 155/64, pulse 64, temperature 98 F (36.7 C), temperature source Oral, resp. rate 16, weight 65.4 kg, SpO2 96 %.  Medical Problem List and Plan: 1. 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 She'll need some motivation at times to participate  Start B/C vitamin complex  Continue CIR- PT, OT and SLP  I have rescheduled her outpatient Botox for February.   Continue CIR- PT, OT and SLPcon't to encourage to participate 2.  Atrial fibrillation/acute renal artery thrombosis: -DVT/anticoagulation:  INR 2.6 today, give warfarin 1mg  today Messaged Becky to set up appointment at Coumadin clinic Pending hypercoagulable labs, primary care provider could consider transitioning back to NOAC if the work-up is negative.  Consider hematology referral as outpatient.             -antiplatelet therapy: Aspirin 81 mg daily 3. Pain Management: Tylenol  1/8- denies pain- con't regimen 4. Situational depression: LCSW to evaluate and provide emotional support             -antipsychotic agents: N/A             -daughter supportive. Pt is unhappy about still being in the hospital. Will need encouragement and positive reinforcement from team. Discuss with daughter whether she may benefit from antidepressant. She does not want an anti-depressant so I will stop the Celexa that was started for her 5. Neuropsych: This patient is not capable of making decisions on her own behalf. 6. Skin/Wound Care: Routine skin care checks 7. Fluids/Electrolytes/Nutrition: Ins and outs and follow-up chemistries. Dys 3 diet 8.  9.  HTN: Increase lopressor to 62.5mg  BID -consider resuming amlodipine (home med) bp permitting -magnesium low but will not supplement given AKI 10:  Klebsiella positive, multifocal pyelonephritis with abscess: Infectious disease recommend transition from intravenous cefazolin to cefadroxil twice a day on discharge to complete a 4-week treatment course.  Stop date of the 10/20/2021. 11: Possible seizure activity:  --continue Keppra 12: Constipation: continue prn miralax 13: AKI: baseline Scr> 0.70. repeat 1/11 14.  Mild anemia: No indication for transfusion.  Monitor H&H.  1/2- Hb dropped slightly to 9.7 from 10.3 15. Suboptimal vitamin D: start ergocalciferol 50,000U once per week for 7 weeks. 16. Urinary incontinence:  improving, continue bladder program  17. Bradycardia: decrease lopressor to 50mg  BID  1/8- HR controlled-- con't regimen   LOS: 10 days A FACE TO FACE EVALUATION WAS PERFORMED  Lindsey [name] Lindsey [name] 10/11/2021, 12:12 PM

## 2021-10-11 NOTE — Plan of Care (Signed)
°  Problem: RH Stairs Goal: LTG Patient will ambulate up and down stairs w/assist (PT) Description: LTG: Patient will ambulate up and down # of stairs with assistance (PT) Outcome: Not Applicable Note: DC goal   Problem: RH Ambulation Goal: LTG Patient will ambulate in controlled environment (PT) Description: LTG: Patient will ambulate in a controlled environment, # of feet with assistance (PT). Flowsheets (Taken 10/11/2021 1223) LTG: Pt will ambulate in controlled environ  assist needed:: Moderate Assistance - Patient 50 - 74% LTG: Ambulation distance in controlled environment: 2ft Goal: LTG Patient will ambulate in home environment (PT) Description: LTG: Patient will ambulate in home environment, # of feet with assistance (PT). Flowsheets (Taken 10/11/2021 1223) LTG: Pt will ambulate in home environ  assist needed:: Moderate Assistance - Patient 50 - 74% LTG: Ambulation distance in home environment: 27ft   Problem: RH Wheelchair Mobility Goal: LTG Patient will propel w/c in controlled environment (PT) Description: LTG: Patient will propel wheelchair in controlled environment, # of feet with assist (PT) Flowsheets (Taken 10/11/2021 1223) LTG: Pt will propel w/c in controlled environ  assist needed:: Supervision/Verbal cueing LTG: Propel w/c distance in controlled environment: 36ft Goal: LTG Patient will propel w/c in home environment (PT) Description: LTG: Patient will propel wheelchair in home environment, # of feet with assistance (PT). Flowsheets (Taken 10/11/2021 1223) LTG: Pt will propel w/c in home environ  assist needed:: Supervision/Verbal cueing Distance: wheelchair distance in controlled environment: 10

## 2021-10-11 NOTE — Progress Notes (Signed)
Occupational Therapy Session Note  Patient Details  Name: Lindsey [name] MRN: [id number] Date of Birth: 09-06-1956  Today's Date: 10/11/2021 OT Individual Time: 0800-0908 OT Individual Time Calculation (min): 68 min    Short Term Goals: Week 2:  OT Short Term Goal 1 (Week 2): STG= LTG d/t ELOS  Skilled Therapeutic Interventions/Progress Updates:  Pt greeted supine in bed nodding "no" to therapy although pt also nodding "no" to most questions.  Session focus on BADL reeducation, functional mobility, attention, and decreasing overall caregiver burden.  Pt needed MOD A to exit to EOB to L side needing assist to manage BLEs and to elevate trunk. Set pt up for self feeding with pt taking 2 bites of grits and some sips of OJ with LUE. Pt donned new [place] shirt with MOD A and MAX A for LB dressing and total A to don shoes and R AFO. Pt able to stand from EOB with Rw with MIN A while OTA pulled up pts pants. Pt completed stand pivot transfer from EOB>w/c to L side with no AD and MOD A. Pt transported to sink with total A where pt washed her face with set- up assist and completed oral care with set- up assist but MAX cues for sequencing as pt initially putting brush to her forehead. Pt transported to gym with total A where remainder of session to focus on attention and vision. Pt completed seated therapeutic activity or Matching colored dots with bean bags, pt completed task with 100% accuracy. Pt completed colored Peg board tasks with verbal cues provided of "put all the green in a row." Pt completed task with 90% accuracy as pt not putting pegs in a row but did group like colors together. Pt able to Match dice to dots with numbers with 100% accuracy with verbal cue of "match number 4." Pt instructed to Put correct number of beads on numbers with MAX cues. Pt had difficulty locating bean bag on R side needing verbal cues to turn head to R to locate stimulus on R side. Worked on stating numbers with pt able to  state, "3 and 4" with no cues but unable to state "5" without cues. Pt was able to count to 4 with no cues. Pt transported back to room with total A where pt left up in w/c with alarm belt activated and all needs within reach.                      Therapy Documentation Precautions:  Precautions Precautions: Fall, Other (comment) Precaution Comments: Baseline R-side hemiplegia; global aphasia Required Braces or Orthoses: Other Brace, Splint/Cast Splint/Cast: Baseline RUE resting hand splint Other Brace: Baseline R AFO for ambulation Restrictions Weight Bearing Restrictions: No  Pain: no s/s of pain during session     Therapy/Group: Individual Therapy  Corinne Ports [facility] 10/11/2021, 9:09 AM

## 2021-10-11 NOTE — Progress Notes (Addendum)
Patient ID: Lindsey [name], female   DOB: [birth year], 66 y.o.   MRN: [id number] Spoke with daughter to set up family training for Wed from 1:00-4:00 pm. See on wed-daughter is aware pt wants to go home sooner than Friday

## 2021-10-12 LAB — PROTIME-INR
INR: 3.2 — ABNORMAL HIGH (ref 0.8–1.2)
Prothrombin Time: 33 seconds — ABNORMAL HIGH (ref 11.4–15.2)

## 2021-10-12 MED ORDER — METOPROLOL TARTRATE 50 MG PO TABS
50.0000 mg | ORAL_TABLET | Freq: Two times a day (BID) | ORAL | Status: DC
  Administered 2021-10-12 – 2021-10-13 (×2): 50 mg via ORAL
  Filled 2021-10-12 (×2): qty 1

## 2021-10-12 NOTE — Progress Notes (Signed)
Physical Therapy Session Note  Patient Details  Name: Lindsey [name] MRN: [id number] Date of Birth: [birth year]  Today's Date: 10/12/2021 PT Individual Time: 1100-1154 PT Individual Time Calculation (min): 54 min   Short Term Goals: Week 2:  PT Short Term Goal 1 (Week 2): STG = LTG due to ELOS  Skilled Therapeutic Interventions/Progress Updates:      NT assisting pt with toilet transfer via Stedy upon PT entrance. Grandson at bedside and while pt was toileting, we discussed DC planning and tailoring this session on family education/training to ensure safe transition home. Direct handoff of care from NT with pt sitting in w/c. Donned socks/AFO/shoes with totalA for time and then transported to main rehab gym. With grandson present, completed x2 stand<>Pivot transfers with minA and no AD with HHA on L - transfers completed in both L/R directions. Educated grandson on guarding, positioning, and cues to provide pt to assist with transfers. Grandson completed x4 transfers with PT providing CGA (fading to supervision). Pt with most difficulty motor planning pivoting and steps to turn during the transfer but grandson does well in guarding and showing ability to manage pt during transfer. Further education on home safety, f/u therapy recommendations, fall prevention strategies, etc. Pt instructed in gait training in hallways - ambulating ~60f with minA and QC with +2 assist for w/c follow for safety with grandson actively observing. Recommended to grandson to avoid ambulation at home until she becomes more balanced and gait quality improves - he voiced understanding. With gait, required facilitation for lateral weight shifting and TC on back of R leg to initiate swing phase. Gait distance limited primarily by fatigue/effort. Pt shaking head 'no' to further therapeutic interventions and she was returned to room in w/c where she remained seated with safety belt alarm on, all needs within reach, LUE supported with  1/2 lap tray. All needs met.    Therapy Documentation Precautions:  Precautions Precautions: Fall, Other (comment) Precaution Comments: Baseline R-side hemiplegia; global aphasia Required Braces or Orthoses: Other Brace, Splint/Cast Splint/Cast: Baseline RUE resting hand splint Other Brace: Baseline R AFO for ambulation Restrictions Weight Bearing Restrictions: No General:    Therapy/Group: Individual Therapy  Tracen Mahler P Ellouise Mcwhirter 10/12/2021, 7:26 AM

## 2021-10-12 NOTE — Progress Notes (Signed)
PROGRESS NOTE   Subjective/Complaints: Working with OT Shaking head no Wants to go home soon Bradycardic  ROS: Unable to assess due to aphasia Objective:   No results found. No results for input(s): WBC, HGB, HCT, PLT in the last 72 hours.  No results for input(s): NA, K, CL, CO2, GLUCOSE, BUN, CREATININE, CALCIUM in the last 72 hours.    Intake/Output Summary (Last 24 hours) at 10/12/2021 0949 Last data filed at 10/12/2021 0340 Gross per 24 hour  Intake 120 ml  Output 142 ml  Net -22 ml        Physical Exam: Vital Signs Blood pressure 99/66, pulse (!) 59, temperature 98 F (36.7 C), resp. rate 17, weight 65.4 kg, SpO2 100 %. Gen: no distress, normal appearing HEENT: oral mucosa pink and moist, NCAT Cardio: Bradycardic Chest: normal effort, normal rate of breathing Abd: soft, non-distended Ext: no edema Psychiatric: very flat Neurological: nonverbal shakes head no vehemently.  Skin: intact Neurological: often shaking head no.     Mental Status: She is alert.     Comments: Pt is alert. Follows simple basic commands. Nods head "yes/no" to simple questions. Exp>rec aphasia. Did not engage much however during exam due to mood. RUE tr/5. RLE 1-2/5 prox to 0-trace distally (poor effort). Moves left arm and leg freely. Decreased pain sense Right arm and leg. Does have resting flexor tone right wrist and elbow.   GU: improving continence      Assessment/Plan: 1. Functional deficits which require 3+ hours per day of interdisciplinary therapy in a comprehensive inpatient rehab setting. Physiatrist is providing close team supervision and 24 hour management of active medical problems listed below. Physiatrist and rehab team continue to assess barriers to discharge/monitor patient progress toward functional and medical goals  Care Tool:  Bathing    Body parts bathed by patient: Face, Left upper leg   Body parts  bathed by helper: Buttocks, Front perineal area, Left lower leg, Right lower leg, Face, Abdomen, Chest, Right arm, Left arm     Bathing assist Assist Level: Total Assistance - Patient < 25%     Upper Body Dressing/Undressing Upper body dressing   What is the patient wearing?: Pull over shirt    Upper body assist Assist Level: Moderate Assistance - Patient 50 - 74%    Lower Body Dressing/Undressing Lower body dressing      What is the patient wearing?: Pants     Lower body assist Assist for lower body dressing: Maximal Assistance - Patient 25 - 49%     Toileting Toileting    Toileting assist Assist for toileting: Dependent - Patient 0%     Transfers Chair/bed transfer  Transfers assist     Chair/bed transfer assist level: Moderate Assistance - Patient 50 - 74%     Locomotion Ambulation   Ambulation assist   Ambulation activity did not occur: Safety/medical concerns          Walk 10 feet activity   Assist  Walk 10 feet activity did not occur: Safety/medical concerns        Walk 50 feet activity   Assist Walk 50 feet with 2 turns activity did not occur:  Safety/medical concerns         Walk 150 feet activity   Assist Walk 150 feet activity did not occur: Safety/medical concerns         Walk 10 feet on uneven surface  activity   Assist Walk 10 feet on uneven surfaces activity did not occur: Safety/medical concerns         Wheelchair     Assist Is the patient using a wheelchair?: Yes Type of Wheelchair: Manual    Wheelchair assist level: Minimal Assistance - Patient > 75% Max wheelchair distance: 150    Wheelchair 50 feet with 2 turns activity    Assist        Assist Level: Minimal Assistance - Patient > 75%   Wheelchair 150 feet activity     Assist      Assist Level: Minimal Assistance - Patient > 75%   Blood pressure 99/66, pulse (!) 59, temperature 98 F (36.7 C), resp. rate 17, weight 65.4 kg,  SpO2 100 %.  Medical Problem List and Plan: 1. 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 She'll need some motivation at times to participate  Start B/C vitamin complex  Continue CIR- PT, OT and SLP  I have rescheduled her outpatient Botox for February.  2.  Atrial fibrillation/acute renal artery thrombosis: -DVT/anticoagulation:  INR 3.2 today, hold warfarin. Messaged Becky to set up appointment at Coumadin clinic Pending hypercoagulable labs, primary care provider could consider transitioning back to NOAC if the work-up is negative.  Consider hematology referral as outpatient.             -antiplatelet therapy: Aspirin 81 mg daily 3. Pain Management: N/A 4. Situational depression: LCSW to evaluate and provide emotional support             -antipsychotic agents: N/A             -daughter supportive. Pt is unhappy about still being in the hospital. Will need encouragement and positive reinforcement from team. Discuss with daughter whether she may benefit from antidepressant. She does not want an anti-depressant so I will stop the Celexa that was started for her 5. Neuropsych: This patient is not capable of making decisions on her own behalf. 6. Skin/Wound Care: Routine skin care checks 7. Fluids/Electrolytes/Nutrition: Ins and outs and follow-up chemistries. Dys 3 diet 8.  9.  HTN: Decrease lopressor to 50 mg BID -consider resuming amlodipine (home med) bp permitting -magnesium low but will not supplement given AKI 10: Klebsiella positive, multifocal pyelonephritis with abscess: Infectious disease recommend transition from intravenous cefazolin to cefadroxil twice a day on discharge to complete a 4-week treatment course.  Stop date of the 10/20/2021. 11: Possible seizure activity:  --continue  Keppra 12: Constipation: continue prn miralax 13: AKI: baseline Scr> 0.70. repeat 1/11 14.  Mild anemia: No indication for transfusion.  Monitor H&H.  1/2- Hb dropped slightly to 9.7 from 10.3 15. Suboptimal vitamin D: start ergocalciferol 50,000U once per week for 7 weeks. 16. Urinary incontinence: improving, continue bladder program  17. Bradycardia: decrease lopressor to 50mg  BID  1/8- HR controlled-- con't regimen   LOS: 11 days A FACE TO FACE EVALUATION WAS PERFORMED  Clide Deutscher Bridgitt Raggio 10/12/2021, 9:49 AM

## 2021-10-12 NOTE — Progress Notes (Signed)
Occupational Therapy Session Note  Patient Details  Name: Lindsey [name] MRN: [id number] Date of Birth: Aug 13, 1956  Today's Date: 10/12/2021 OT Individual Time: 0902-1002 OT Individual Time Calculation (min): 60 min    Short Term Goals: Week 1:  OT Short Term Goal 1 (Week 1): Pt will complete sit to stand with no lift and max A in prep for standing ADL. OT Short Term Goal 1 - Progress (Week 1): Met OT Short Term Goal 2 (Week 1): Pt will complete grooming task at sink with no more than min VCs and S. OT Short Term Goal 2 - Progress (Week 1): Met OT Short Term Goal 3 (Week 1): Pt will don shirt with max A. OT Short Term Goal 3 - Progress (Week 1): Met OT Short Term Goal 4 (Week 1): Pt will bathe UB with mod A. OT Short Term Goal 4 - Progress (Week 1): Met Week 2:  OT Short Term Goal 1 (Week 2): STG= LTG d/t ELOS Week 3:     Skilled Therapeutic Interventions/Progress Updates:    Patient seen this A.M. requiring a great deal of motivation in relation to functional outcome. Patient worked on [employer] to improve feedback in relation to incorporating the extremity during functional task performance.  The pt completed resistive exercises to improve communication of the joint for active engagement with trace movement.  The pt indicated that she was experiencing tingling of the RUE extremity. The pt went on to complete sit to stand in front of a mirror to improve anatomical position for greater balance during task performance.  The pt was able to wash her face and comb her hair with s/u, she was able to use the tootle to for oral care while standing with rest breaks as needed.  The pt returned to her  w/c with her alarm in place, the call light within reach and all additional needs addressed.  The pt's grandson came in for a visit and was educated on techniques to related to positioning, BUE strength to improve her functional outcome towards goal attainment. The pt had no report of pain this treatment  session.  Therapy Documentation Precautions:  Precautions Precautions: Fall, Other (comment) Precaution Comments: Baseline R-side hemiplegia; global aphasia Required Braces or Orthoses: Other Brace, Splint/Cast Splint/Cast: Baseline RUE resting hand splint Other Brace: Baseline R AFO for ambulation Restrictions Weight Bearing Restrictions: No General:   Vital Signs:  Pain: Pain Assessment Pain Score: 0-No pain A Vision   Perception    Praxis   Balance   Exercises:   Other Treatments:     Therapy/Group: Individual Therapy  Yvonne Kendall 10/12/2021, 12:49 PM

## 2021-10-12 NOTE — Progress Notes (Signed)
ANTICOAGULATION CONSULT NOTE - Follow Up Consult  Pharmacy Consult for Coumadin Indication:  CVA + Acute renal artery thrombosis   No Known Allergies  Patient Measurements: Weight: 65.4 kg (144 lb 2.9 oz)  Vital Signs: Temp: 98 F (36.7 C) (01/10 0316) BP: 99/66 (01/10 0316) Pulse Rate: 59 (01/10 0316)  Labs: Recent Labs    10/10/21 0549 10/11/21 0540 10/12/21 0519  LABPROT 29.1* 30.3* 33.0*  INR 2.8* 2.9* 3.2*     Estimated Creatinine Clearance: 45.2 mL/min (A) (by C-G formula based on SCr of 1.08 mg/dL (H)).   Assessment: Anticoag: Recent CVA and h/o AFib > on apix PTA.  Acute renal artery thrombosis >> hep gtt while PTA apix held >> switching AC to warfarin w/ enox bridge in the setting of new clot while on apix and concern for APS >> warfarin - 12/26: Lupus anticoagulant elevated while on IV heparin. Results NOT valid for patient receiving IV heparin at that time). Anticardiolipin ab elevated (true result), Beta 2 glycoprotein negative - INR 2.9>>3.2 today  Goal of Therapy:  INR 2-3 Monitor platelets by anticoagulation protocol: Yes   Plan:  Hold warfarin x 1 day. -Daily INR, CBC qWed   Muhammadali Ries S. Alford Highland, PharmD, BCPS Clinical Staff Pharmacist [email or web address]  Alford Highland, [employer] 10/12/2021,8:53 AM

## 2021-10-12 NOTE — Progress Notes (Signed)
Occupational Therapy Session Note  Patient Details  Name: Lindsey [name] MRN: [id number] Date of Birth: January 03, 1956  Today's Date: 10/12/2021 OT Individual Time: 4888-9169 OT Individual Time Calculation (min): 53 min    Short Term Goals: Week 1:  OT Short Term Goal 1 (Week 1): Pt will complete sit to stand with no lift and max A in prep for standing ADL. OT Short Term Goal 1 - Progress (Week 1): Met OT Short Term Goal 2 (Week 1): Pt will complete grooming task at sink with no more than min VCs and S. OT Short Term Goal 2 - Progress (Week 1): Met OT Short Term Goal 3 (Week 1): Pt will don shirt with max A. OT Short Term Goal 3 - Progress (Week 1): Met OT Short Term Goal 4 (Week 1): Pt will bathe UB with mod A. OT Short Term Goal 4 - Progress (Week 1): Met Week 2:  OT Short Term Goal 1 (Week 2): STG= LTG d/t ELOS  Skilled Therapeutic Interventions/Progress Updates:    Pt received seated in w/c, no s/sx pain and appears agreeable to therapy. Session focus on self-care retraining, activity tolerance, transfer retraining, functional cognition in prep for improved ADL/IADL/func mobility performance + decreased caregiver burden. Note left on table stating "change of clothes in black bag," no black bag found in room, but clean shirt. Pt doffed shirt with min A to pull over head and remove R hand, donned shirt with min A to thread RUE and cues to pull down over R trunk. Declined further need for ADL. Total A w/c transport to and from gym. Stood at high low table with min A to power up/block R knee, but pt with posterior bias and falling back into chair after several seconds. Declining further stands.   Seated, completed 2 level 1 difficulty lego designs with overall max A to select appropriate colors and for step-by-step visual cues for accurate build. Pt did state "yes" when asked if design was 100% accurate.   Stood x2 with min A and completed modified push-ups to promote RUE WB for increased tone  management.  Per PT, grandson requesting alternative to half lap tray for RUE therapeutic positioning, swapped out with arm trough, but pt unable to state which method she prefers. RUE in better positioning with trough.  Seated at [facility], completed the follow to target R visual scanning, sustained attention to task, and command follow:  -Single Target: 92% accuracy, 3.40 reaction time, 35 hits.   -Attempted Bell Cancellation task, but pt hitting all pictures and unable to follow commands to only select bells.  Stand-pivot back to bed with mod A for RLE management and initiation and LUE on bed rail. Mod A to return to supine to manage RLE.  Pt left semi-reclined in bed with bed alarm engaged, call bell in reach, and all immediate needs met.    Therapy Documentation Precautions:  Precautions Precautions: Fall, Other (comment) Precaution Comments: Baseline R-side hemiplegia; global aphasia Required Braces or Orthoses: Other Brace, Splint/Cast Splint/Cast: Baseline RUE resting hand splint Other Brace: Baseline R AFO for ambulation Restrictions Weight Bearing Restrictions: No  Pain:  No s/sx throughout session ADL: See Care Tool for more details.   Therapy/Group: Individual Therapy  Volanda Napoleon MS, OTR/L  10/12/2021, 6:52 AM

## 2021-10-12 NOTE — Progress Notes (Signed)
Speech Language Pathology Daily Session Note  Patient Details  Name: Lindsey [name] MRN: [id number] Date of Birth: 10/30/1955  Today's Date: 10/12/2021 SLP Individual Time: 1030-1100 SLP Individual Time Calculation (min): 30 min  Short Term Goals: Week 2: SLP Short Term Goal 1 (Week 2): Patient will name common objects with phrase completion and printed word cues, with modA verbal cues for 75% accuracy SLP Short Term Goal 2 (Week 2): Patient will respond to mildly complex yes/no questions verbally or with head nod/shake with 80% accuracy and modA cues to respond. SLP Short Term Goal 3 (Week 2): Patient will consume trials of regular solids snacks with SLP with no more than mild amount of oral residuals and modA cues to clear. SLP Short Term Goal 4 (Week 2): Patient will imitate to produce functional phrases (help me, etc) with maxA verbal modeling cues to perform. SLP Short Term Goal 5 (Week 2): Patient will comment and/or request help either verbally or via gestures with modA verbal, gestural cues to initiate.  Skilled Therapeutic Interventions:Skilled ST services focused on education and language skills. Pt's grandson, Lenon Curt, was present for education today and brought pt's AAC device, lingraphica. SLP utilized AAC device to facilitate multimodal communication, however the number of selections (5-15 icons), increase visual distraction and reduced ability to scan right of midline made the device only functional in response to yes/no questions at this time. Pt demonstrated 33% accuracy or less when using AAC device even with limiting choices to 3 icons. Pt demonstrated increase language comprehension utilizing simple black/white 6 field picture board. Pt was able to select requested ADL picture in 3/3 opportunities over 2 trials and in 5/6 opportunities. SLP provided education to pt and pt's grandson pertaining to utilizing black/white ADL picture board of 6 fields, with hopes of advancing to  lingraphica with HH ST and use of lingraphica with yes/no questions. Pt was left in room with family, call bell within reach and chair alarm set. SLP recommends to continue skilled services.     Pain Pain Assessment Pain Scale: 0-10 Pain Score: 0-No pain  Therapy/Group: Individual Therapy  Eliyah Bazzi  [facility] 10/12/2021, 11:14 AM

## 2021-10-13 LAB — CBC
HCT: 29.3 % — ABNORMAL LOW (ref 36.0–46.0)
Hemoglobin: 9.5 g/dL — ABNORMAL LOW (ref 12.0–15.0)
MCH: 28 pg (ref 26.0–34.0)
MCHC: 32.4 g/dL (ref 30.0–36.0)
MCV: 86.4 fL (ref 80.0–100.0)
Platelets: 500 10*3/uL — ABNORMAL HIGH (ref 150–400)
RBC: 3.39 MIL/uL — ABNORMAL LOW (ref 3.87–5.11)
RDW: 15.6 % — ABNORMAL HIGH (ref 11.5–15.5)
WBC: 3.9 10*3/uL — ABNORMAL LOW (ref 4.0–10.5)
nRBC: 0 % (ref 0.0–0.2)

## 2021-10-13 LAB — BASIC METABOLIC PANEL
Anion gap: 12 (ref 5–15)
BUN: 11 mg/dL (ref 8–23)
CO2: 21 mmol/L — ABNORMAL LOW (ref 22–32)
Calcium: 9.2 mg/dL (ref 8.9–10.3)
Chloride: 104 mmol/L (ref 98–111)
Creatinine, Ser: 1.16 mg/dL — ABNORMAL HIGH (ref 0.44–1.00)
GFR, Estimated: 52 mL/min — ABNORMAL LOW (ref >60)
Glucose, Bld: 96 mg/dL (ref 70–99)
Potassium: 3.8 mmol/L (ref 3.5–5.1)
Sodium: 137 mmol/L (ref 135–145)

## 2021-10-13 LAB — PROTIME-INR
INR: 2.9 — ABNORMAL HIGH (ref 0.8–1.2)
Prothrombin Time: 30.3 seconds — ABNORMAL HIGH (ref 11.4–15.2)

## 2021-10-13 MED ORDER — METOPROLOL TARTRATE 50 MG PO TABS
50.0000 mg | ORAL_TABLET | Freq: Every day | ORAL | Status: DC
  Administered 2021-10-14 – 2021-10-22 (×9): 50 mg via ORAL
  Filled 2021-10-13 (×9): qty 1

## 2021-10-13 MED ORDER — WARFARIN 0.5 MG HALF TABLET
0.5000 mg | ORAL_TABLET | ORAL | Status: DC
  Administered 2021-10-13: 0.5 mg via ORAL
  Filled 2021-10-13: qty 1

## 2021-10-13 MED ORDER — WARFARIN SODIUM 1 MG PO TABS
1.0000 mg | ORAL_TABLET | ORAL | Status: DC
  Administered 2021-10-14: 1 mg via ORAL
  Filled 2021-10-13: qty 1

## 2021-10-13 MED ORDER — METOPROLOL TARTRATE 50 MG PO TABS
62.5000 mg | ORAL_TABLET | Freq: Every day | ORAL | Status: DC
  Administered 2021-10-14 – 2021-10-21 (×8): 62.5 mg via ORAL
  Filled 2021-10-13 (×9): qty 1

## 2021-10-13 NOTE — Progress Notes (Addendum)
Patient ID: Lindsey [name], female   DOB: August 16, 1956, 66 y.o.   MRN: [id number] MD spoke with daughter regarding extension and both have decided upon one more week to get pt more consistently min assist level since currently she is requiring some mod assist. Daughter is also recovering from surgery herself. New discharge date is 1/20.  3;25 PM Touched base with pt and daughter aware of the extension and staying for another week 1/20. Pt did well with family here for education and pushed herself in therapies this afternoon.

## 2021-10-13 NOTE — Progress Notes (Addendum)
Pt refused all medications tonight. This nurse educated pt on what she was taking and why she was taking it and pt would shake her head NO and close her eyes. This nurse would wake up pt again and reorient her and pt closed her mouth tight and shook her head NO again. This nurse again educated pt on why she was taking medication and offered juice to drink after and pt still declined. Will try to offer meds again later. Pt declines any needs, bed in lowest position, call bell within reach.   Update* Patient was toileted HS and this nurse assisted NT with toilet transfer. This nurse offered meds again, explained what it is and why she is scheduled to take, Pt declined again. Pt was offered water to increase flid intake and patient declined that as well shaking her head no and clenching mouth shut. Pt was places back in bed, denies any needs, call light within reach.

## 2021-10-13 NOTE — Progress Notes (Signed)
Occupational Therapy Session Note  Patient Details  Name: Lindsey [name] MRN: [id number] Date of Birth: Sep 20, 1956  Today's Date: 10/13/2021 OT Individual Time: 3403-5248 OT Individual Time Calculation (min): 60 min    Short Term Goals: Week 2:  OT Short Term Goal 1 (Week 2): STG= LTG d/t ELOS  Skilled Therapeutic Interventions/Progress Updates:  Patient met lying supine in bed. Daughter and grandson present at bedside for family education session. No s/s of pain at rest or with activity. Patient requiring increased coaxing/encouragement from daughter for participation with session. Daughter reports Hx of L shoulder surgery with preference for stand-pivot transfers vs squat-pivot transfers. Eduction provided on patient current level of function requiring increased physical assistance and multimodal cueing. Family expressed verbal understanding. Daughter demonstrates ability to complete bed mobility and all functional tranfers from EOB > wc > simulated walk-in shower and BSC with good body mechanics and good multimodal cueing. Daughter reports patient "starting over at square 1" demonstrating decline in function compared to baseline. Family confirms presence of DME including QC, wc, R lap tray, hospital bed, BSC, and tub bench. Patient with some difficulty managing BLE requiring increased assistance from daughter. Education on sponge bathing upon return home initially until family feels more comfortable with walk-in shower transfers. +2 assist for safety and manual manipulation of RLE with simulated walk-in shower transfers. Upon return to hospital room, daughter assisted patient with toileting/hygiene/clothing management in sitting/standing. Patient able to manage clothing on L in standing with assist from daughter on R. Session concluded with patient seated in wc with call bell within reach, and all needs met. Family present at bedside in anticipation of PT family education session.   Therapy  Documentation Precautions:  Precautions Precautions: Fall, Other (comment) Precaution Comments: Baseline R-side hemiplegia; global aphasia Required Braces or Orthoses: Other Brace, Splint/Cast Splint/Cast: Baseline RUE resting hand splint Other Brace: Baseline R AFO for ambulation Restrictions Weight Bearing Restrictions: No General:    Therapy/Group: Individual Therapy  Preethi Scantlebury R Howerton-Davis 10/13/2021, 8:58 AM

## 2021-10-13 NOTE — Patient Care Conference (Signed)
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 Leeroy Cha Social Worker Present: Ovidio Kin, LCSW Nurse Present: Dorien Chihuahua, RN PT Present: Ginnie Smart, PT OT Present: Willeen Cass, OT SLP Present: Charolett Bumpers, SLP PPS Coordinator present : Gunnar Fusi, SLP     Current Status/Progress Goal Weekly Team Focus  Bowel/Bladder   Incontinent of B/B. LBM 10/12/21  Regain continence.      Swallow/Nutrition/ Hydration   dys 3 textures and thin liquids with supervision  regular/thin Supervision  regular trials and use of strategies   ADL's   MAX A for LB ADLs, MOD A for UB ADLs, MOD A for toilet transfers to [facility], MAX A for 3/3 toileting tasks  min assist  attention during ADLs, safety awareness, transfers, BADL reeducation, balanc and endurance   Mobility   modA bed mobility, SBA sitting balance, min to modA for sit<>stand and stand<>pivot transfers (depending on effort), ambulating ~7-6ft modA using QC. Continues to show apathy towards therapy without improvement despite family presence and encouragement.  minA short distance gait, supervision w/c mobility  Functional  transfers, gait training, DC planning, family education/training   Communication   max A verbalization, mod A multimodal  mod A  education, communication board, yes/no reponse, simple phrases   Safety/Cognition/ Behavioral Observations  min A  min A  sustained attention   Pain   Denies  Pain goal <3/10      Skin   Grossly Intact  Maintain Skin Integrity  Assess Q shift and PRN     Discharge Planning:  Pt wanting to go home earlier than DC date-Friday. Daughter coming in today for education and OP therapies set up. Daughter aware of pt's need for 24/7 physical care at DC. Neuro-psych saw and will recommned follow up counseling for her depression   Team Discussion: VSS after BP meds adjusted per MD. Incontinent of bowel and bladder with poor participation with therapy. Patient is anxious to go home but still requires min - mod assist. Working on use of a simplified communication system like the patient used PTA.   Patient on target to meet rehab goals: yes, currently min - mod assist overall. Requires min assist for sit- stand and stand pivot transfers but is not consistent and can need up to mod assist for transfers. Needs min assist for ambulation.  Goals for discharge set for min assist overall.  *See Care Plan and progress notes for long and short-term goals.   Revisions to Treatment Plan:  Extended ELOS to work on functional consistency   Teaching Needs: Safety, toileting, transfers, medications, secondary risk management, etc.  Current Barriers to Discharge: Decreased caregiver support, Home enviroment access/layout, and Incontinence  Possible  Resolutions to Barriers: Family education completed with daughter and grandson Referral to OP coumadin clinic OP follow up services scheduled     Medical Summary Current Status: bradycardic, BP well controlled, incontinent of bowel and bladder, impaired initation and motivation, post-stroke spasticity  Barriers to Discharge: Medical  stability  Barriers to Discharge Comments: bradycardic, BP well controlled, incontinent of bowel and bladder, impaired initation and motivation, post-stroke spasticity Possible Resolutions to [employer] Focus: adjust lopressor dose, continue to monitor vitals TID, consider medication to improve initiation but need to consider risk of seizures   Continued Need for Acute Rehabilitation Level of Care: The patient requires daily medical management by a physician with specialized training in physical medicine and rehabilitation for the following reasons: Direction of a multidisciplinary physical rehabilitation program to maximize functional independence : Yes Medical management of patient stability for increased activity during participation in an intensive rehabilitation regime.: Yes Analysis of laboratory values and/or radiology reports with any subsequent need for medication adjustment and/or medical intervention. : Yes   I attest that I was present, lead the team conference, and concur with the assessment and plan of the team.   Dorien Chihuahua B 10/13/2021, 2:21 PM

## 2021-10-13 NOTE — Progress Notes (Signed)
ANTICOAGULATION CONSULT NOTE - Follow Up Consult  Pharmacy Consult for Coumadin Indication:  CVA + Acute renal artery thrombosis   No Known Allergies  Patient Measurements: Weight: 65.4 kg (144 lb 2.9 oz)  Vital Signs: Temp: 98.4 F (36.9 C) (01/11 0445) Temp Source: Oral (01/11 0445) BP: 105/71 (01/11 0445) Pulse Rate: 56 (01/11 0445)  Labs: Recent Labs    10/11/21 0540 10/12/21 0519 10/13/21 0532  HGB  --   --  9.5*  HCT  --   --  29.3*  PLT  --   --  500*  LABPROT 30.3* 33.0* 30.3*  INR 2.9* 3.2* 2.9*  CREATININE  --   --  1.16*     Estimated Creatinine Clearance: 42.1 mL/min (A) (by C-G formula based on SCr of 1.16 mg/dL (H)).   Assessment: Anticoag: Recent CVA and h/o AFib > on apix PTA.  Acute renal artery thrombosis >> hep gtt while PTA apix held >> switching AC to warfarin w/ enox bridge in the setting of new clot while on apix and concern for APS >> warfarin - 12/26: Lupus anticoagulant elevated while on IV heparin. Results NOT valid for patient receiving IV heparin at that time). Anticardiolipin ab elevated (true result), Beta 2 glycoprotein negative - INR back to 2.9 today, Hgb 9.5 stable. Plts 500 elevated  Goal of Therapy:  INR 2-3 Monitor platelets by anticoagulation protocol: Yes   Plan:  -For discharge, try Warfarin 0.5mg  MWF and 1mg  TTSS -Daily INR, CBC qWed   Jalisia Puchalski S. , PharmD, BCPS Clinical Staff Pharmacist [email or web address]  Merilynn Finland, Lear Carstens Stillinger 10/13/2021,7:43 AM

## 2021-10-13 NOTE — Progress Notes (Signed)
Physical Therapy Session Note  Patient Details  Name: Lindsey [name] MRN: [id number] Date of Birth: [birth year]  Today's Date: 10/13/2021 PT Individual Time: 1430-1530  PT Individual Time Calculation (min): 60 min  Short Term Goals: Week 2:  PT Short Term Goal 1 (Week 2): STG = LTG due to ELOS  Skilled Therapeutic Interventions/Progress Updates:     Pt presents sitting in w/c with family (daughter and grandson) at the bedside. Focused session on scheduled family education and training. Family reporting completing transfer training with prior OT session and voiced understanding of technique and ability. Family inquiring on her L AFO fitting, reporting muscle atropthy of LLE resulting in poor fit of solid AFO. Will reach out to [facility] to assess. Focused session on car transfers and short distance gait training. Pt transported to ortho rehab gym to work on car transfers. She completed x2 transfers with PT assisting at [facility] level and daughter completed x2 transfers with PT providing CGA for safety. Pt requires minA for standing and then max instructional cues for sequencing stand<>pivot transfer. She often gets "frozen" in standing and has difficulty motor planning steps to transfer. Daughter does well in cueing and guarding, but would benefit from further practice. Pt then instructed in short distance gait training - ambulating ~11ft + ~32ft with min/modA and wide based quad cane. Primary gait deficits include step-to pattern, forward flexed trunk, decreased lateral weight shift, decreased ability to initiate RLE swing, and difficulty sequencing steps/cane - max instructional cues and PT facilitating lateral weight shift and advancing RLE in swing. Pt able to advance RLE ~10% of the time without assist, but this varies based on foot placement, R hip extension, and sustained/focused attention to task. Pt concluded session with short distance w/c propulsion, using LLE only she required modA for  propelling ~47ft on level surface - unable to navigate over very small thresholds without assistance and required VC for forward lean to assist with efficiency during LLE propulsion. Pt returned to her room at end of session in w/c and remained seated with safety belt alarm on, LUE in trough, all needs within reach. Family remaining at bedside.   Therapy Documentation Precautions:  Precautions Precautions: Fall, Other (comment) Precaution Comments: Baseline R-side hemiplegia; global aphasia Required Braces or Orthoses: Other Brace, Splint/Cast Splint/Cast: Baseline RUE resting hand splint Other Brace: Baseline R AFO for ambulation Restrictions Weight Bearing Restrictions: No General:    Therapy/Group: Individual Therapy  Sarrah Fiorenza P Kelci Petrella 10/13/2021, 7:25 AM

## 2021-10-13 NOTE — Progress Notes (Signed)
Speech Language Pathology Daily Session Note  Patient Details  Name: Lindsey [name] MRN: [id number] Date of Birth: 1956-02-11  Today's Date: 10/13/2021 SLP Individual Time: 1300-1330 SLP Individual Time Calculation (min): 30 min  Short Term Goals: Week 2: SLP Short Term Goal 1 (Week 2): Patient will name common objects with phrase completion and printed word cues, with modA verbal cues for 75% accuracy SLP Short Term Goal 2 (Week 2): Patient will respond to mildly complex yes/no questions verbally or with head nod/shake with 80% accuracy and modA cues to respond. SLP Short Term Goal 3 (Week 2): Patient will consume trials of regular solids snacks with SLP with no more than mild amount of oral residuals and modA cues to clear. SLP Short Term Goal 4 (Week 2): Patient will imitate to produce functional phrases (help me, etc) with maxA verbal modeling cues to perform. SLP Short Term Goal 5 (Week 2): Patient will comment and/or request help either verbally or via gestures with modA verbal, gestural cues to initiate.  Skilled Therapeutic Interventions: Skilled [employer] focused on education and language skills. SLP facilitated receptive language utilizing 6 cell black/white communication board, pt was able to select 4/6 increasing to 5/6 items when named given mod A semantic cues. Pt was able to imitate functional words from communication board in 4/6 opportunities with max A visual cues. Pt's daughter and grandson entered into room. SLP provided education pertaining to functional communication relying on yes/no communication ( verbal response is more accurate than gesture for pt), simple communication board with 3-5 choices at a time and using more complex AAC device with yes/no questions to further identify pt's wants/needs. Pt was able to select request object given 3 black white drawings in 3 trials and in 1 out 2 trials select items among a field of 4. Pt's daughter expressed concerns with  vision/tracking/scanning impacting ability to select items accurately. Pt's daughter confirmed she will adjust AAC device to a field of 3-5 at a time to limit distraction. All questions answered to satisfaction. Pt was left in room with family, bed alarm set. Recommend to continue ST services.     Pain Pain Assessment Pain Scale: Faces Pain Score: 0-No pain Faces Pain Scale: No hurt  Therapy/Group: Individual Therapy  Geniene List  [facility] 10/13/2021, 3:51 PM

## 2021-10-13 NOTE — Progress Notes (Signed)
Occupational Therapy Session Note  Patient Details  Name: Lindsey [name] MRN: [id number] Date of Birth: 04-Jan-1956  Today's Date: 10/13/2021 OT Individual Time: 0800-0900 OT Individual Time Calculation (min): 60 min    Short Term Goals: Week 2:  OT Short Term Goal 1 (Week 2): STG= LTG d/t ELOS  Skilled Therapeutic Interventions/Progress Updates:  Pt greeted supine in bed nodding "no" to therapy but did not resist any treatments during session. NT reported pt had not voided all night. Pt completed supine>sit with MOD A needing assist to maneuver RLE to EOB and assist to elevate trunk and scoot hips fully to EOB. MIN A for sit<>stand with HHA and MOD A to pivot to [facility] going to R side with HHA. Pt with +bladder void ( NT aware), set- up assist for pericare, doffed pants and brief with total A, MAX A to don new brief and pants from [facility] with MIN A to stand and MAX A to pull pants up to waist line in standing. Pt completed additonal stand pivot transfer from BSC>EOB with MIN A. Pt donned shirt with MOD A form EOB, total A to don shoes and AFO. MOD A for stand pivot back to wc with HHA. Pt transported to sink with total A where pt able to wash her face with set- up assist and completed oral care with set- up assist and MAX verbal cues for sequencing as pt noted to be both internally/externally distracted needing cues to focus on task at hand. Pt transported to gym with total A where pt worked on sustained attention to task with pt instructed to match playing cards to cards on board. Pt able to match 3/9 with no cues but needed MOD cues to complete the rest as pt would be able to match the number but would not notice mismatching the suit. Pt transported back to room with total A where pt completed stand pivot back to bed with MOD HHA. MAX A for sit>supine with pt left supine in bed with all needs within reach.                      Therapy Documentation Precautions:  Precautions Precautions: Fall, Other  (comment) Precaution Comments: Baseline R-side hemiplegia; global aphasia Required Braces or Orthoses: Other Brace, Splint/Cast Splint/Cast: Baseline RUE resting hand splint Other Brace: Baseline R AFO for ambulation Restrictions Weight Bearing Restrictions: No  Pain: no s/s of pain during session     Therapy/Group: Individual Therapy  Corinne Ports [facility] 10/13/2021, 9:13 AM

## 2021-10-13 NOTE — Progress Notes (Signed)
PROGRESS NOTE   Subjective/Complaints: No new complaints today INR therapeutic- discussed need for coumadin clinic upon d/c  Will update daughter regarding team's decision to keep Friday d/c  ROS: Unable to assess due to aphasia Objective:   No results found. Recent Labs    10/13/21 0532  WBC 3.9*  HGB 9.5*  HCT 29.3*  PLT 500*    Recent Labs    10/13/21 0532  NA 137  K 3.8  CL 104  CO2 21*  GLUCOSE 96  BUN 11  CREATININE 1.16*  CALCIUM 9.2      Intake/Output Summary (Last 24 hours) at 10/13/2021 1305 Last data filed at 10/12/2021 1835 Gross per 24 hour  Intake 100 ml  Output --  Net 100 ml        Physical Exam: Vital Signs Blood pressure 110/65, pulse 66, temperature 98 F (36.7 C), temperature source Oral, resp. rate 17, weight 65.4 kg, SpO2 100 %. Gen: no distress, normal appearing HEENT: oral mucosa pink and moist, NCAT Cardio: Reg rate Chest: normal effort, normal rate of breathing Abd: soft, non-distended Ext: no edema Psychiatric: very flat Neurological: nonverbal shakes head no vehemently.  Skin: intact Neurological: often shaking head no.     Mental Status: She is alert.     Comments: Pt is alert. Follows simple basic commands. Nods head "yes/no" to simple questions. Exp>rec aphasia. Did not engage much however during exam due to mood. RUE tr/5. RLE 1-2/5 prox to 0-trace distally (poor effort). Moves left arm and leg freely. Decreased pain sense Right arm and leg. Does have resting flexor tone right wrist and elbow.   GU: improving continence      Assessment/Plan: 1. Functional deficits which require 3+ hours per day of interdisciplinary therapy in a comprehensive inpatient rehab setting. Physiatrist is providing close team supervision and 24 hour management of active medical problems listed below. Physiatrist and rehab team continue to assess barriers to discharge/monitor patient  progress toward functional and medical goals  Care Tool:  Bathing    Body parts bathed by patient: Face, Left upper leg   Body parts bathed by helper: Buttocks, Front perineal area, Left lower leg, Right lower leg, Face, Abdomen, Chest, Right arm, Left arm     Bathing assist Assist Level: Total Assistance - Patient < 25%     Upper Body Dressing/Undressing Upper body dressing   What is the patient wearing?: Pull over shirt    Upper body assist Assist Level: Moderate Assistance - Patient 50 - 74%    Lower Body Dressing/Undressing Lower body dressing      What is the patient wearing?: Pants, Underwear/pull up     Lower body assist Assist for lower body dressing: Maximal Assistance - Patient 25 - 49%     Toileting Toileting    Toileting assist Assist for toileting: Maximal Assistance - Patient 25 - 49%     Transfers Chair/bed transfer  Transfers assist     Chair/bed transfer assist level: Moderate Assistance - Patient 50 - 74%     Locomotion Ambulation   Ambulation assist   Ambulation activity did not occur: Safety/medical concerns  Walk 10 feet activity   Assist  Walk 10 feet activity did not occur: Safety/medical concerns        Walk 50 feet activity   Assist Walk 50 feet with 2 turns activity did not occur: Safety/medical concerns         Walk 150 feet activity   Assist Walk 150 feet activity did not occur: Safety/medical concerns         Walk 10 feet on uneven surface  activity   Assist Walk 10 feet on uneven surfaces activity did not occur: Safety/medical concerns         Wheelchair     Assist Is the patient using a wheelchair?: Yes Type of Wheelchair: Manual    Wheelchair assist level: Minimal Assistance - Patient > 75% Max wheelchair distance: 150    Wheelchair 50 feet with 2 turns activity    Assist        Assist Level: Minimal Assistance - Patient > 75%   Wheelchair 150 feet activity      Assist      Assist Level: Minimal Assistance - Patient > 75%   Blood pressure 110/65, pulse 66, temperature 98 F (36.7 C), temperature source Oral, resp. rate 17, weight 65.4 kg, SpO2 100 %.  Medical Problem List and Plan: 1. 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 She'll need some motivation at times to participate  Start B/C vitamin complex  Continue CIR- PT, OT and SLP  I have rescheduled her outpatient Botox for February.  2.  Atrial fibrillation/acute renal artery thrombosis: -DVT/anticoagulation:  INR 2.9 today, dose warfarin. Messaged Becky to set up appointment at Coumadin clinic Pending hypercoagulable labs, primary care provider could consider transitioning back to NOAC if the work-up is negative.  Placed outpatient hematology referral.              -antiplatelet therapy: Aspirin 81 mg daily 3. Pain Management: N/A 4. Situational depression: LCSW to evaluate and provide emotional support             -antipsychotic agents: N/A             -daughter supportive. Pt is unhappy about still being in the hospital. Will need encouragement and positive reinforcement from team. Discuss with daughter whether she may benefit from antidepressant. She does not want an anti-depressant so I will stop the Celexa that was started for her 5. Neuropsych: This patient is not capable of making decisions on her own behalf. 6. Skin/Wound Care: Routine skin care checks 7. Fluids/Electrolytes/Nutrition: Ins and outs and follow-up chemistries. Dys 3 diet 8.  9.  HTN: Changed lopressor to 50mg  daily, 62.5mg  at night to avoid bradycardia -consider resuming amlodipine (home med) bp permitting -magnesium low but will not supplement given AKI 10: Klebsiella positive, multifocal pyelonephritis with  abscess: Infectious disease recommend transition from intravenous cefazolin to cefadroxil twice a day on discharge to complete a 4-week treatment course.  Stop date of the 10/20/2021. 11: Possible seizure activity:  -continue Keppra 12: Constipation: continue prn miralax 13: AKI: baseline Scr> 0.70. repeat 1/11 14.  Mild anemia: No indication for transfusion.  Monitor H&H.  1/2- Hb dropped slightly to 9.7 from 10.3 15. Suboptimal vitamin D: start ergocalciferol  50,000U once per week for 7 weeks. 16. Urinary incontinence: improving, continue bladder program  17. Bradycardia: decrease lopressor to 50mg  BID  1/8- HR controlled-- con't regimen   LOS: 12 days A FACE TO FACE EVALUATION WAS PERFORMED  Yareth Kearse P Jezabel Lecker 10/13/2021, 1:05 PM

## 2021-10-14 LAB — PROTIME-INR
INR: 2.3 — ABNORMAL HIGH (ref 0.8–1.2)
Prothrombin Time: 25.5 seconds — ABNORMAL HIGH (ref 11.4–15.2)

## 2021-10-14 NOTE — Progress Notes (Signed)
Speech Language Pathology Weekly Progress and Session Note  Patient Details  Name: Lindsey [name] MRN: [id number] Date of Birth: [birth year]  Beginning of progress report period: 10/08/2021 End of progress report period: 10/14/2021  Today's Date: 10/14/2021 SLP Individual Time: 0930-1015 SLP Individual Time Calculation (min): 45 min  Short Term Goals: Week 2: SLP Short Term Goal 1 (Week 2): Patient will name common objects with phrase completion and printed word cues, with modA verbal cues for 75% accuracy SLP Short Term Goal 1 - Progress (Week 2): Met SLP Short Term Goal 2 (Week 2): Patient will respond to mildly complex yes/no questions verbally or with head nod/shake with 80% accuracy and modA cues to respond. SLP Short Term Goal 2 - Progress (Week 2): Progressing toward goal SLP Short Term Goal 3 (Week 2): Patient will consume trials of regular solids snacks with SLP with no more than mild amount of oral residuals and modA cues to clear. SLP Short Term Goal 3 - Progress (Week 2): Progressing toward goal SLP Short Term Goal 4 (Week 2): Patient will imitate to produce functional phrases (help me, etc) with maxA verbal modeling cues to perform. SLP Short Term Goal 4 - Progress (Week 2): Progressing toward goal SLP Short Term Goal 5 (Week 2): Patient will comment and/or request help either verbally or via gestures with modA verbal, gestural cues to initiate. SLP Short Term Goal 5 - Progress (Week 2): Met    New Short Term Goals: Week 3: SLP Short Term Goal 1 (Week 3): STG=LTG (ELOS: discharge 1/20)  Weekly Progress Updates:  Patient met 2 of 5 STG's but is making some progress towards the goals she did not meet. She continues to require significant amount of cueing and encouragement to participate and respond but her overall accuracy with object naming, imitating, and non-verbal communication has improved. PO intake has been fairly limited and so have not completed regular texture solids  trials. Her CIR stay has been extended by one week in order to work towards more consistent functioning (minA for PT, OT).    Intensity: Minumum of 1-2 x/day, 30 to 90 minutes Frequency: 3 to 5 out of 7 days Duration/Length of Stay: 1/20 Treatment/Interventions: Cognitive remediation/compensation;Dysphagia/aspiration precaution training;Internal/external aids;Speech/Language facilitation;Cueing hierarchy;Environmental controls;Therapeutic Activities;Functional tasks;Patient/family education   Daily Session  Skilled Therapeutic Interventions: Patient seen for skilled ST session focused on aphasia goals. Patient in bed and alert but required mod-maxA verbal cues to participate fully. She did not engage in any attempts for imitating or object naming, but did participate in non-verbal tasks. She was 90% accurate for identifying verb/action photos in field of two, 100% accurate with identifying emotion photos in field of two and 100% accurate in identifying object photos in field of two. SLP setup her AAC device which she had been working on at home and OP SLP but patient refused to participate. She was left in bed with call button within reach. She continues to benefit from skilled SLP intervention to maximize language and swallow function prior to discharge.    General    Pain Pain Assessment Pain Scale: Faces Pain Score: 0-No pain  Therapy/Group: Individual Therapy  Sonia Baller, MA, CCC-SLP Speech Therapy

## 2021-10-14 NOTE — Progress Notes (Signed)
PROGRESS NOTE   Subjective/Complaints: Seems upset about extension of discharge date- spoke with daughter yesterday and daughter said she would discuss with her. Shakes head no. Explained to her goals of extension  ROS: Unable to assess due to aphasia  Objective:   No results found. Recent Labs    10/13/21 0532  WBC 3.9*  HGB 9.5*  HCT 29.3*  PLT 500*    Recent Labs    10/13/21 0532  NA 137  K 3.8  CL 104  CO2 21*  GLUCOSE 96  BUN 11  CREATININE 1.16*  CALCIUM 9.2      Intake/Output Summary (Last 24 hours) at 10/14/2021 1139 Last data filed at 10/14/2021 0855 Gross per 24 hour  Intake 380 ml  Output --  Net 380 ml        Physical Exam: Vital Signs Blood pressure 114/68, pulse 86, temperature 97.8 F (36.6 C), temperature source Oral, resp. rate 18, weight 65.4 kg, SpO2 100 %. Gen: no distress, normal appearing HEENT: oral mucosa pink and moist, NCAT Cardio: Reg rate Chest: normal effort, normal rate of breathing Abd: soft, non-distended Ext: no edema Psychiatric: very flat, shakes head no much of the time Neurological: nonverbal shakes head no vehemently.  Skin: intact Neurological: often shaking head no.     Mental Status: She is alert.     Comments: Pt is alert. Follows simple basic commands. Nods head "yes/no" to simple questions. Exp>rec aphasia. Did not engage much however during exam due to mood. RUE tr/5. RLE 1-2/5 prox to 0-trace distally (poor effort). Moves left arm and leg freely. Decreased pain sense Right arm and leg. Does have resting flexor tone right wrist and elbow.   GU: improving continence      Assessment/Plan: 1. Functional deficits which require 3+ hours per day of interdisciplinary therapy in a comprehensive inpatient rehab setting. Physiatrist is providing close team supervision and 24 hour management of active medical problems listed below. Physiatrist and rehab team  continue to assess barriers to discharge/monitor patient progress toward functional and medical goals  Care Tool:  Bathing    Body parts bathed by patient: Face   Body parts bathed by helper: Buttocks, Front perineal area, Left lower leg, Right lower leg, Face, Abdomen, Chest, Right arm, Left arm     Bathing assist Assist Level: Minimal Assistance - Patient > 75%     Upper Body Dressing/Undressing Upper body dressing   What is the patient wearing?: Pull over shirt    Upper body assist Assist Level: Moderate Assistance - Patient 50 - 74%    Lower Body Dressing/Undressing Lower body dressing      What is the patient wearing?: Pants     Lower body assist Assist for lower body dressing: Maximal Assistance - Patient 25 - 49%     Toileting Toileting    Toileting assist Assist for toileting: Maximal Assistance - Patient 25 - 49%     Transfers Chair/bed transfer  Transfers assist     Chair/bed transfer assist level: Moderate Assistance - Patient 50 - 74%     Locomotion Ambulation   Ambulation assist   Ambulation activity did not occur: Safety/medical concerns  Walk 10 feet activity   Assist  Walk 10 feet activity did not occur: Safety/medical concerns        Walk 50 feet activity   Assist Walk 50 feet with 2 turns activity did not occur: Safety/medical concerns         Walk 150 feet activity   Assist Walk 150 feet activity did not occur: Safety/medical concerns         Walk 10 feet on uneven surface  activity   Assist Walk 10 feet on uneven surfaces activity did not occur: Safety/medical concerns         Wheelchair     Assist Is the patient using a wheelchair?: Yes Type of Wheelchair: Manual    Wheelchair assist level: Minimal Assistance - Patient > 75% Max wheelchair distance: 150    Wheelchair 50 feet with 2 turns activity    Assist        Assist Level: Minimal Assistance - Patient > 75%    Wheelchair 150 feet activity     Assist      Assist Level: Minimal Assistance - Patient > 75%   Blood pressure 114/68, pulse 86, temperature 97.8 F (36.6 C), temperature source Oral, resp. rate 18, weight 65.4 kg, SpO2 100 %.  Medical Problem List and Plan: 1. 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 She'll need some motivation at times to participate  Start B/C vitamin complex  Continue CIR- PT, OT and SLP  I have rescheduled her outpatient Botox for February.  2.  Atrial fibrillation/acute renal artery thrombosis: -DVT/anticoagulation:  INR 2.3 today, dose warfarin. Messaged Becky to set up appointment at Coumadin clinic Pending hypercoagulable labs, primary care provider could consider transitioning back to NOAC if the work-up is negative.  Placed outpatient hematology referral.              -antiplatelet therapy: continue Aspirin 81 mg daily 3. Pain Management: N/A 4. Situational depression: LCSW to evaluate and provide emotional support             -antipsychotic agents: N/A             -daughter supportive. Pt is unhappy about still being in the hospital. Will need encouragement and positive reinforcement from team. Discuss with daughter whether she may benefit from antidepressant. She does not want an anti-depressant so I will stop the Celexa that was started for her 5. Neuropsych: This patient is not capable of making decisions on her own behalf. 6. Skin/Wound Care: Routine skin care checks 7. Fluids/Electrolytes/Nutrition: Ins and outs and follow-up chemistries. Dys 3 diet 8.  9.  HTN: Changed lopressor to 50mg  daily, 62.5mg  at night to avoid bradycardia, seems to be most effective dosing- continue -consider resuming amlodipine (home med) bp permitting -magnesium low but will  not supplement given AKI 10: Klebsiella positive, multifocal pyelonephritis with abscess: Infectious disease recommend transition from intravenous cefazolin to cefadroxil twice a day on discharge to complete a 4-week treatment course.  Stop date of the 10/20/2021. 11: Possible seizure activity:  -continue Keppra 12: Constipation: continue prn miralax 13: AKI: baseline Scr> 0.70. repeat 1/11 14.  Mild anemia: No indication for transfusion.  Monitor H&H.  1/2- Hb dropped slightly to 9.7  from 10.3 15. Suboptimal vitamin D: start ergocalciferol 50,000U once per week for 7 weeks. 16. Urinary incontinence: improving, continue bladder program  17. Bradycardia: see #9   LOS: 13 days A FACE TO FACE EVALUATION WAS PERFORMED  Clide Deutscher Shourya Macpherson 10/14/2021, 11:39 AM

## 2021-10-14 NOTE — Progress Notes (Signed)
ANTICOAGULATION CONSULT NOTE - Follow Up Consult  Pharmacy Consult for Coumadin Indication:  CVA + Acute renal artery thrombosis   No Known Allergies  Patient Measurements: Weight: 65.4 kg (144 lb 2.9 oz)  Vital Signs: Temp: 97.8 F (36.6 C) (01/12 0416) Temp Source: Oral (01/12 0416) BP: 114/68 (01/12 0416) Pulse Rate: 86 (01/12 0416)  Labs: Recent Labs    10/12/21 0519 10/13/21 0532 10/14/21 0510  HGB  --  9.5*  --   HCT  --  29.3*  --   PLT  --  500*  --   LABPROT 33.0* 30.3* 25.5*  INR 3.2* 2.9* 2.3*  CREATININE  --  1.16*  --      Estimated Creatinine Clearance: 42.1 mL/min (A) (by C-G formula based on SCr of 1.16 mg/dL (H)).   Assessment: Anticoag: Recent CVA and h/o AFib > on apix PTA.  Acute renal artery thrombosis >> hep gtt while PTA apix held >> switching AC to warfarin w/ enox bridge in the setting of new clot while on apix and concern for APS >> warfarin - 12/26: Lupus anticoagulant elevated while on IV heparin. Results NOT valid for patient receiving IV heparin at that time). Anticardiolipin ab elevated (true result), Beta 2 glycoprotein negative - INR 2.3, Hgb 9.5 stable. Plts 500 elevated  Goal of Therapy:  INR 2-3 Monitor platelets by anticoagulation protocol: Yes   Plan:  -Warfarin 0.5mg  MWF and 1mg  TTSS (refused meds 1/11 PM but warfarin is charted as given) -Daily INR, CBC qWed   Jahlon Baines S. Alford Highland, PharmD, BCPS Clinical Staff Pharmacist [email or web address]  Alford Highland, [place] 10/14/2021,9:04 AM

## 2021-10-14 NOTE — Progress Notes (Signed)
Physical Therapy Session Note  Patient Details  Name: Lindsey [name] MRN: [id number] Date of Birth: [birth year]  Today's Date: 10/14/2021 PT Individual Time: 4604-7998 PT Individual Time Calculation (min): 70 min   Short Term Goals: Week 2:  PT Short Term Goal 1 (Week 2): STG = LTG due to ELOS  Skilled Therapeutic Interventions/Progress Updates:      Pt presenting supine in bed to start session. Pt shaking head 'no' to PT session but agreeable with convincing. Supine<>sit completed with modA using bed features and heavy reliance of bedrail support (pt has hospital bed at home). Required modA for facilitation forward scooting at EOB to achieve feet flat. Donned R AFO and shoes with totalA for time. Completed stand<>pivot transfer with min/modA from EOB to w/c - she continues to get "frozen" in standing and has the most difficulty with taking small steps to turn, often trying to sit prior to fully being over sitting surface. Transported in w/c to main rehab gym for time and assisted to mat table in similar manner, providing alternative cueing (both visual vs verbal) to achieve small steps during transfer. Able to sit unsupported at mat table with supervision. Retrieved RW and RW splint to attempt to progress gait away from QC to improve confidence, balance ,and reduce need for cane sequencing during gait. Difficulty with placing her paretic RUE into splint due to tone in the wrist and digits. Once positioned, required min/modA +2 with RW for ambulating ~33f as pt unable to initiate stepping during gait. Returned to mat table and worked on pStandard Pacificsuch as weight shifting and forward/backward stepping with RLE. Weight shifting task incorporated hanging horse shoes from top of large mirror, placed on her L side, with min/modA for balance during task while using her stronger LUE to place - PT facilitating lateral weight shifting during reaching tasks. Forward/backward stepping with 1st trial to 2inch  block and 2nd trial to colored target while using back of arm chair for LUE support. She required maxA for facilitation on RLE with limiting hip/knee flexion in limb advancement. Pt assisted back to w/c with min/modA stand<>pivot transfer with similar cues as above and similar deficits related to lack of stepping during transfer. Returned to her room where she requested to return to bed due to fatigue. Stand<>pivot transfer to bed with min/modA and +2 dependent scoot in bed for repositioning. Pt concluded session supine in bed with all needs met, bed alarm on, made comfortable.   Therapy Documentation Precautions:  Precautions Precautions: Fall, Other (comment) Precaution Comments: Baseline R-side hemiplegia; global aphasia Required Braces or Orthoses: Other Brace, Splint/Cast Splint/Cast: Baseline RUE resting hand splint Other Brace: Baseline R AFO for ambulation Restrictions Weight Bearing Restrictions: No General:    Therapy/Group: Individual Therapy  CAlger Simons1/09/2022, 12:25 PM

## 2021-10-14 NOTE — Progress Notes (Signed)
Occupational Therapy Session Note  Patient Details  Name: Lindsey [name] MRN: [id number] Date of Birth: [birth year]  Today's Date: 10/14/2021 OT Individual Time: 8366-2947 OT Individual Time Calculation (min): 69 min    Short Term Goals: Week 2:  OT Short Term Goal 1 (Week 2): STG= LTG d/t ELOS  Skilled Therapeutic Interventions/Progress Updates:    Pt greeted semi-reclined in bed with nurse tech encouraging pt to eat breakfast, handoff to OT. Pt shook head "no" to getting out of bed to eat. Bed placed in trendelenburg and pt able to assist with pulling up in bed using L side. OT positioned pt into upright position in bed. OT encouraged pt to use words to communicate, but relies mostly on head nods. Pt able to communicate she wanted butter on her food and was able to state  "just pancakes." OT set-up breakfast with pt preferred condiments, then pt used utensil to stir food around, but then declined to take any bites. OT provided max encouragement, but she continued to refuse. With max encouragement, pt agreeable to get up and change clothes. Pt needed max A for bed mobility due to poor initiation and R hemiplegia. Stand-pivot bed >wc with mod A. Pt declined any bathing tasks despite encouragement and did not need to go to the bathroom. Worked on R visual scanning to locate items at the sink, hemi dressing technqiues, and sit<>stands within dressing tasks. Overall mod A for UB and max A for LB. Sit<>stands were mod A, and mod A for balance. OT facilitated weight bearing through R UE while standing at the sink for neuro re-ed. OT provided gentle stretching and ROM to R UE and placed kinesiotape at shoulder joint for support and pain management. Pt declined to stay up in wc despite encouragement. Min A squat-pivot back to bed using bed rails and to stronger L side. Mod A to then transition back to supine. Pt left semi-reclined in bed with bed alarm on, call bell in reach, and needs met.   Therapy  Documentation Precautions:  Precautions Precautions: Fall, Other (comment) Precaution Comments: Baseline R-side hemiplegia; global aphasia Required Braces or Orthoses: Other Brace, Splint/Cast Splint/Cast: Baseline RUE resting hand splint Other Brace: Baseline R AFO for ambulation Restrictions Weight Bearing Restrictions: No Pain:  Denies pain  Therapy/Group: Individual Therapy  Valma Cava 10/14/2021, 8:45 AM

## 2021-10-15 ENCOUNTER — Encounter: Payer: Self-pay | Admitting: *Deleted

## 2021-10-15 LAB — PROTIME-INR
INR: 2 — ABNORMAL HIGH (ref 0.8–1.2)
Prothrombin Time: 22.5 seconds — ABNORMAL HIGH (ref 11.4–15.2)

## 2021-10-15 MED ORDER — WARFARIN SODIUM 2 MG PO TABS
2.0000 mg | ORAL_TABLET | Freq: Once | ORAL | Status: AC
  Administered 2021-10-15: 2 mg via ORAL
  Filled 2021-10-15: qty 1

## 2021-10-15 NOTE — Progress Notes (Signed)
Occupational Therapy Session Note  Patient Details  Name: Lindsey [name] MRN: [id number] Date of Birth: Dec 29, 1955  Today's Date: 10/15/2021 OT Individual Time: 1015-1100 OT Individual Time Calculation (min): 45 min    Short Term Goals: Week 2:  OT Short Term Goal 1 (Week 2): STG= LTG d/t ELOS  Skilled Therapeutic Interventions/Progress Updates:  Pt greeted seated in w/c with grandson present( grandson present throughout session). Session focus on BADL reeducation, functional mobility, dynamic standing balance, caregiver education/training, cognition, functional sit<>stands and decreasing overall caregiver burden.  Pt transported to gym with total A. MOD A for stand pivot transfer from w/c>EOM with no AD and HHA. Pt completed simple color recognition task with pt asked to locate colored letters with 2 choices provided, pt completed task with ~ 30% accuracy but able to hand all letters to her grandson with one verbal cue. Pt unable to recognize letters when 2 choices provided. Worked on functional sit<>stands and dynamic standing balance with pt standing from EOM with HHA and MIN A to shoot ball into hoop with LUE. Pt completed 2 trials with pt shooting ball 10x each trial. Pts grandson demo'ed competency with helping pt back to chair via stand pivot transfer. Total A to transport pt back to room. Pt stated "yes" when asked about toileting needs, MOD A for stand pivot transfer from w/c<>BSC, -bladder void/ BM. MAX A for 3/3 toileting tasks. Pt completed additional stand pivot back to EOB with MOD A, MOD A sit>supine needing assist to elevate BLEs. pt left  supine in bed with bed alarm activated, RUE elevated and all needs within reach, grandson present.                    Therapy Documentation Precautions:  Precautions Precautions: Fall, Other (comment) Precaution Comments: Baseline R-side hemiplegia; global aphasia Required Braces or Orthoses: Other Brace, Splint/Cast Splint/Cast: Baseline RUE  resting hand splint Other Brace: Baseline R AFO for ambulation Restrictions Weight Bearing Restrictions: No  Pain: no s/s of pain reported during session     Therapy/Group: Individual Therapy  Precious Haws 10/15/2021, 12:29 PM

## 2021-10-15 NOTE — Progress Notes (Signed)
Physical Therapy Session Note  Patient Details  Name: Lindsey [name] MRN: [id number] Date of Birth: [birth year]  Today's Date: 10/15/2021 PT Individual Time: 0800-0856 PT Individual Time Calculation (min): 56 min   Short Term Goals: Week 2:  PT Short Term Goal 1 (Week 2): STG = LTG due to ELOS  Skilled Therapeutic Interventions/Progress Updates:     Pt supine in bed sleeping on arrival - awakens easily to voice and is agreeable to PT tx with convincing. Donned pants at bed level with totalA for time. Able to roll towards her R with minA and required modA for rolling L. Supine<>Sit with modA for rolling and trunk support. Donned AFO, shoes, and R hand brace with totalA for time. Stand<>pivot transfer with min/modA from EOB to w/c - facilitation primarily needed for forward weight shift during standing and lateral weight shifting during transfers - continues to be "frozen" in standing and struggles with motor planning to take steps during transfer. Transported to main rehab gym in w/c for time and assisted to mat table in similar manner. At [facility] table, worked on sit<>stands and pre-gait tasks. Pre-gait tasks included lateral stepping to target and lateral stepping over small (1inch) obstacle. Again, motor planning vs fear of falling impacting her ability to engage in these tasks. Provided visual targets for stepping which were inconsistent in assisting her in initiating gait. She was able to ambulate a total of ~45f + ~161f+ ~[id number] treatment session with modA and QC on L. Continues to require facilitation for lateral weight shift and advancing RLE to initiate swing. Pt was taken to quite, isolated hallway to practice gait training due to poor selective attention which had minimal improvement in gait initiation. Pt transported back to her room for time. Remained seated in w/c and setupA for breakfast tray - pt requires full supervision so direct hand off to NT for meal supervision. All needs met.    Therapy Documentation Precautions:  Precautions Precautions: Fall, Other (comment) Precaution Comments: Baseline R-side hemiplegia; global aphasia Required Braces or Orthoses: Other Brace, Splint/Cast Splint/Cast: Baseline RUE resting hand splint Other Brace: Baseline R AFO for ambulation Restrictions Weight Bearing Restrictions: No General:    Therapy/Group: Individual Therapy  Lindsey Simons13/2023, 7:37 AM

## 2021-10-15 NOTE — Progress Notes (Signed)
Speech Language Pathology Daily Session Note  Patient Details  Name: Lindsey [name] MRN: [id number] Date of Birth: 1956/03/08  Today's Date: 10/15/2021 SLP Individual Time: 1355-1420 SLP Individual Time Calculation (min): 25 min  Short Term Goals: Week 3: SLP Short Term Goal 1 (Week 3): STG=LTG (ELOS: discharge 1/20)  Skilled Therapeutic Interventions: Skilled treatment session focused on cognitive-linguistic goals. SLP facilitated session by providing specific scenarios in which patient had to identify the appropriate picture from her current communication board that contains 6 functional items. Patient completed task with 65% accuracy. Patient also read the names of family members with Mod verbal and visual cues to self-monitor and correct phonemic errors and perseveration. SLP also provided a calendar as a visual aid to maximize recall and carryover of discharge date. Patient left upright in wheelchair with alarm on and all needs within reach. Continue with current plan of care.      Pain No indications of pain   Therapy/Group: Individual Therapy  [name] 10/15/2021, 2:26 PM

## 2021-10-15 NOTE — Progress Notes (Signed)
ANTICOAGULATION CONSULT NOTE - Follow Up Consult  Pharmacy Consult for Coumadin Indication:  CVA + Acute renal artery thrombosis   No Known Allergies  Patient Measurements: Weight: 65.4 kg (144 lb 2.9 oz)  Vital Signs: Temp: 98.2 F (36.8 C) (01/13 0550) Temp Source: Oral (01/13 0550) BP: 110/66 (01/13 0550) Pulse Rate: 78 (01/13 0550)  Labs: Recent Labs    10/13/21 0532 10/14/21 0510 10/15/21 0505  HGB 9.5*  --   --   HCT 29.3*  --   --   PLT 500*  --   --   LABPROT 30.3* 25.5* 22.5*  INR 2.9* 2.3* 2.0*  CREATININE 1.16*  --   --      Estimated Creatinine Clearance: 42.1 mL/min (A) (by C-G formula based on SCr of 1.16 mg/dL (H)).   Assessment: Anticoag: Recent CVA and h/o AFib > on apix PTA.  Acute renal artery thrombosis >> hep gtt while PTA apix held >> switching AC to warfarin w/ enox bridge in the setting of new clot while on apix and concern for APS >> warfarin - 12/26: Lupus anticoagulant elevated while on IV heparin. Results NOT valid for patient receiving IV heparin at that time). Anticardiolipin ab elevated (true result), Beta 2 glycoprotein negative - INR down to 2, Hgb 9.5 stable. Plts 500 elevated  Goal of Therapy:  INR 2-3 Monitor platelets by anticoagulation protocol: Yes   Plan:  -Increase Coumadin to 2mg  po x 1 tonight. -Daily INR, CBC qWed   Mathilda Maguire S. Alford Highland, PharmD, BCPS Clinical Staff Pharmacist [email or web address]  Alford Highland, Rosabella Edgin Stillinger 10/15/2021,8:19 AM

## 2021-10-15 NOTE — Progress Notes (Signed)
Physical Therapy Session Note  Patient Details  Name: Lindsey [name] MRN: [id number] Date of Birth: 01/11/1956  Today's Date: 10/15/2021 PT Individual Time: 1300-1400 PT Individual Time Calculation (min): 60 min   Short Term Goals: Week 2:  PT Short Term Goal 1 (Week 2): STG = LTG due to ELOS  Skilled Therapeutic Interventions/Progress Updates:     Pt presents supine in bed with grandson at bedside. Pt shaking head 'no' to PT tx but was agreeable with convincing. Supine<>sit completed with minA for BLE management and trunk support, heavy reliance of LUE to bed rail but pt has hospital bed at home. Donned AFO and shoes with totalA for time and then assisted to w/c via stand<>pivot trasnfer with min/modA using no AD, continues to struggle with motor planning steps during transfer and has a "Frozen" period in standing. Transported in w/c to day room rehab gym for time and assisted to Nustep in similar manner. In [place], worked on coordination, Lexicographer, and BLE strengthening. Requires assist for setup due to R sided weakness. Completed 4 min + 4 min (seated rest) at workload 4, using BLE and LUE to assist. - provided TC to RLE to initiate hip extension and terminal knee extension. Pt assisted off of Nustep via stand<>pivot transfer with minA and no AD. Wheeled to quiet hallway to reduce distractions. Instructed in side stepping L<>R along hand rail, providing demonstration to ensure understanding. She's able to rise to stand with use of hand rail with CGA. Pt unable to take small steps sideways in either L or R direction. Required minA for balance for stepping `2-3 ft each direction. Had SPT assist with facilitation RLE management to initiate side stepping. Continues to "freeze" and lacks initiation during stepping. At this point of session, pt shaking head 'no' and giving limited effort in mobility tasks. Returned to room in w/c and remained seated with safety belt alarm on, all needs within  reach.  Therapy Documentation Precautions:  Precautions Precautions: Fall, Other (comment) Precaution Comments: Baseline R-side hemiplegia; global aphasia Required Braces or Orthoses: Other Brace, Splint/Cast Splint/Cast: Baseline RUE resting hand splint Other Brace: Baseline R AFO for ambulation Restrictions Weight Bearing Restrictions: No General:    Therapy/Group: Individual Therapy  Jennessy Sandridge P Meggin Ola 10/15/2021, 1:21 PM

## 2021-10-15 NOTE — Progress Notes (Signed)
PROGRESS NOTE   Subjective/Complaints: Working with therapy INR therapeutic PC elevated Coumadin increased to 2mg  tonight  ROS: Unable to assess due to aphasia  Objective:   No results found. Recent Labs    10/13/21 0532  WBC 3.9*  HGB 9.5*  HCT 29.3*  PLT 500*    Recent Labs    10/13/21 0532  NA 137  K 3.8  CL 104  CO2 21*  GLUCOSE 96  BUN 11  CREATININE 1.16*  CALCIUM 9.2      Intake/Output Summary (Last 24 hours) at 10/15/2021 1100 Last data filed at 10/14/2021 1812 Gross per 24 hour  Intake 300 ml  Output --  Net 300 ml        Physical Exam: Vital Signs Blood pressure 110/66, pulse 78, temperature 98.2 F (36.8 C), temperature source Oral, resp. rate 16, weight 65.4 kg, SpO2 100 %. Gen: no distress, normal appearing HEENT: oral mucosa pink and moist, NCAT Cardio: Reg rate Chest: normal effort, normal rate of breathing Abd: soft, non-distended Ext: no edema Psychiatric: very flat, shakes head no much of the time Neurological: nonverbal shakes head no vehemently.  Skin: intact Neurological: often shaking head no.     Mental Status: She is alert.     Comments: Pt is alert. Follows simple basic commands. Nods head "yes/no" to simple questions. Exp>rec aphasia. Did not engage much however during exam due to mood. RUE tr/5. RLE 1-2/5 prox to 0-trace distally (poor effort). Moves left arm and leg freely. Decreased pain sense Right arm and leg. Does have resting flexor tone right wrist and elbow.   GU: improving continence      Assessment/Plan: 1. Functional deficits which require 3+ hours per day of interdisciplinary therapy in a comprehensive inpatient rehab setting. Physiatrist is providing close team supervision and 24 hour management of active medical problems listed below. Physiatrist and rehab team continue to assess barriers to discharge/monitor patient progress toward functional and  medical goals  Care Tool:  Bathing    Body parts bathed by patient: Face   Body parts bathed by helper: Buttocks, Front perineal area, Left lower leg, Right lower leg, Face, Abdomen, Chest, Right arm, Left arm     Bathing assist Assist Level: Minimal Assistance - Patient > 75%     Upper Body Dressing/Undressing Upper body dressing   What is the patient wearing?: Pull over shirt    Upper body assist Assist Level: Moderate Assistance - Patient 50 - 74%    Lower Body Dressing/Undressing Lower body dressing      What is the patient wearing?: Pants     Lower body assist Assist for lower body dressing: Maximal Assistance - Patient 25 - 49%     Toileting Toileting    Toileting assist Assist for toileting: Maximal Assistance - Patient 25 - 49%     Transfers Chair/bed transfer  Transfers assist     Chair/bed transfer assist level: Moderate Assistance - Patient 50 - 74%     Locomotion Ambulation   Ambulation assist   Ambulation activity did not occur: Safety/medical concerns  Assist level: Moderate Assistance - Patient 50 - 74% Assistive device: Cane-quad Max distance: 45ft  Walk 10 feet activity   Assist  Walk 10 feet activity did not occur: Safety/medical concerns  Assist level: Moderate Assistance - Patient - 50 - 74% Assistive device: Cane-quad   Walk 50 feet activity   Assist Walk 50 feet with 2 turns activity did not occur: Safety/medical concerns         Walk 150 feet activity   Assist Walk 150 feet activity did not occur: Safety/medical concerns         Walk 10 feet on uneven surface  activity   Assist Walk 10 feet on uneven surfaces activity did not occur: Safety/medical concerns         Wheelchair     Assist Is the patient using a wheelchair?: Yes Type of Wheelchair: Manual    Wheelchair assist level: Minimal Assistance - Patient > 75% Max wheelchair distance: 48ft    Wheelchair 50 feet with 2 turns  activity    Assist        Assist Level: Total Assistance - Patient < 25%   Wheelchair 150 feet activity     Assist      Assist Level: Total Assistance - Patient < 25%   Blood pressure 110/66, pulse 78, temperature 98.2 F (36.8 C), temperature source Oral, resp. rate 16, weight 65.4 kg, SpO2 100 %.  Medical Problem List and Plan: 1. 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 She'll need some motivation at times to participate  Start B/C vitamin complex  Continue CIR- PT, OT and SLP  I have rescheduled her outpatient Botox for February.  2.  Atrial fibrillation/acute renal artery thrombosis: -DVT/anticoagulation:  INR 2.0 today, dose 2mg  warfarin. Messaged Becky to set up appointment at Coumadin clinic Pending hypercoagulable labs, primary care provider could consider transitioning back to NOAC if the work-up is negative.  Placed outpatient hematology referral.              -antiplatelet therapy: continue Aspirin 81 mg daily 3. Pain Management: N/A 4. Situational depression: LCSW to evaluate and provide emotional support             -antipsychotic agents: N/A             -daughter supportive. Pt is unhappy about still being in the hospital. Will need encouragement and positive reinforcement from team. Discuss with daughter whether she may benefit from antidepressant. She does not want an anti-depressant so I will stop the Celexa that was started for her 5. Neuropsych: This patient is not capable of making decisions on her own behalf. 6. Skin/Wound Care: Routine skin care checks 7. Dysphagia: Dys 3 diet. Continue SLP 8.  9.  HTN: Changed lopressor to 50mg  daily, 62.5mg  at night to avoid bradycardia, seems to be most effective dosing- continue -will not resume magnesium home med as  BP is stable -magnesium low but will not supplement given AKI 10: Klebsiella positive, multifocal pyelonephritis with abscess: Infectious disease recommend transition from intravenous cefazolin to cefadroxil twice a day on discharge to complete a 4-week treatment course.  Stop date of the 10/20/2021. 11: Possible seizure activity:  -continue Keppra 12: Constipation: continue prn miralax 13: AKI: baseline Scr> 0.70. repeat 1/11 14.  Mild anemia: No indication for transfusion.  Monitor H&H.  1/2- Hb dropped slightly to 9.7 from 10.3 15. Suboptimal vitamin D: continue ergocalciferol 50,000U once per week for 7 weeks. 16. Urinary incontinence: improving, continue bladder program  17. Bradycardia: see #9 19. Elevated platelet count: continue to monitor regularly.   LOS: 14 days A FACE TO FACE EVALUATION WAS PERFORMED  Ricardo Schubach P Tomy Khim 10/15/2021, 11:00 AM

## 2021-10-16 LAB — PROTIME-INR
INR: 1.9 — ABNORMAL HIGH (ref 0.8–1.2)
Prothrombin Time: 22 seconds — ABNORMAL HIGH (ref 11.4–15.2)

## 2021-10-16 MED ORDER — WARFARIN SODIUM 2 MG PO TABS
2.0000 mg | ORAL_TABLET | Freq: Once | ORAL | Status: AC
  Administered 2021-10-16: 2 mg via ORAL
  Filled 2021-10-16: qty 1

## 2021-10-16 NOTE — Progress Notes (Signed)
Speech Language Pathology Daily Session Note  Patient Details  Name: Lindsey [name] MRN: [id number] Date of Birth: [birth year]  Today's Date: 10/16/2021 SLP Individual Time: 1030-1110 SLP Individual Time Calculation (min): 40 min  Short Term Goals: Week 3: SLP Short Term Goal 1 (Week 3): STG=LTG (ELOS: discharge 1/20)  Skilled Therapeutic Interventions:   Patient seen for skilled ST session focused on expressive and receptive language goals. Patient sitting up in [facility] and was agreeable to therapy session. She was able to point to objects in field of three when function was named, with 75% accuracy and pointed to object pictures in field of 4 when function was named with 60% accuracy. She pointed to pictures on her 6-cell communication board (black and white line drawings) when asked function/purpose described and was 80% accurate. She was not able to identify alphabet letters in field of 4 or shapes field of 4 but identified basic level line drawings of objects (shoe, house, balloon, cup) when named. SLP reviewed calendar and discharge date with patient and she reacted as expected, sighing and looking discouraged about having had her discharge date extended by a week. When SLP starting to introduce another task, patient started shaking head and SLP used gestures ('finished') to confirm patient did not want to participate further. She was left in [facility] with all needs within reach. She continues to benefit from skilled SLP intervention to maximize language and swallow function goals prior to discharge.  Pain Pain Assessment Pain Scale: Faces Faces Pain Scale: No hurt  Therapy/Group: Individual Therapy  Angela Nevin, MA, CCC-SLP Speech Therapy

## 2021-10-16 NOTE — Progress Notes (Signed)
Physical Therapy Session Note ° °Patient Details  °Name: Lindsey [name] °MRN: [id number] °Date of Birth: 10/14/1955 ° °Today's Date: 10/16/2021 °PT Individual Time: 1535-1630 °PT Individual Time Calculation (min): 55 min  ° °Short Term Goals: °Week 1:  PT Short Term Goal 1 (Week 1): Patient will perform bed mobility with min A >50% of the time without use of hospital bed features. °PT Short Term Goal 2 (Week 1): Patient will perform basic transfers with min A >50% of the time using LRAD. °PT Short Term Goal 3 (Week 1): Patient will ambulate >50 feet with min A using LRAD. °PT Short Term Goal 4 (Week 1): Patient will improved Berg Balance Scale by 7 points to meed MCD. °Week 2:  PT Short Term Goal 1 (Week 2): STG = LTG due to ELOS ° °Skilled Therapeutic Interventions/Progress Updates:  ° °Pt received supine in bed and agreeable to PT. Supine>sit transfer with mod assist and max encouragement, as pt initially shaking head no when asked if she would be will ing to participate in PT treatment.  ° °Stand pivot tranfers with min assist from elevated bed to WC and mod assist from arm chair height. Cues for improved anterior weight shift and push from arm rest on the LUE.  ° °Sit<>stand with mod assust x 3 and min assist x 1 from WC with max cues for sequencing of movement and improved BLE position prior to initiation of transfer.Dynamic balance training to place and remove 5 clothes pins from horizonztal bar x 2 with min assist for balance with cues for midline and awareness of LOB to the R. .  ° °Gait training 2 x 15ft with RW and min-mod assist. Moderate cues for sequencing of step, weight shift to the R and awareness of need for direction change to prepare for transfer. Pt noted to sit at end of each bout for horizontal, regardless of instuction from pt for turn prior to transfer..  ° °Sustained attention task to performed wii bowling x 4 frames from sitting. Max cues for technique through all 4 rounds, as pt demonstrates  no evidence of learning technique for novel task.  ° °Pt returned to room and performed ambulatory  transfer to bed as listed above. Sit>supine completed with min assist on the RLE, and left supine in bed with call bell in reach and all needs met.  ° ° °   ° °Therapy Documentation °Precautions:  °Precautions °Precautions: Fall, Other (comment) °Precaution Comments: Baseline R-side hemiplegia; global aphasia °Required Braces or Orthoses: Other Brace, Splint/Cast °Splint/Cast: Baseline RUE resting hand splint °Other Brace: Baseline R AFO for ambulation °Restrictions °Weight Bearing Restrictions: No ° °  °Vital Signs: °Therapy Vitals °Temp: 98.4 °F (36.9 °C) °Temp Source: Oral °Pulse Rate: 83 °Resp: 17 °BP: 103/65 °Patient Position (if appropriate): Sitting °Oxygen Therapy °SpO2: 98 % °O2 Device: Room Air °Pain: °Pain Assessment °Pain Scale: Faces °Faces Pain Scale: No hurt ° ° ° °Therapy/Group: Individual Therapy ° °Austin E Tucker °10/16/2021, 4:35 PM  °

## 2021-10-16 NOTE — Progress Notes (Signed)
PROGRESS NOTE   Subjective/Complaints:  Non verbal discussed with SLP, aphasia is consistent with location of 2021 CVA and not her recent cerebellar infarct   ROS: Unable to assess due to aphasia  Objective:   No results found. No results for input(s): WBC, HGB, HCT, PLT in the last 72 hours.   No results for input(s): NA, K, CL, CO2, GLUCOSE, BUN, CREATININE, CALCIUM in the last 72 hours.     Intake/Output Summary (Last 24 hours) at 10/16/2021 1032 Last data filed at 10/16/2021 0803 Gross per 24 hour  Intake 480 ml  Output --  Net 480 ml         Physical Exam: Vital Signs Blood pressure 114/81, pulse 82, temperature 98.5 F (36.9 C), resp. rate 18, weight 65.4 kg, SpO2 98 %.  General: No acute distress Mood and affect are appropriate Heart: Regular rate and rhythm no rubs murmurs or extra sounds Lungs: Clear to auscultation, breathing unlabored, no rales or wheezes Abdomen: Positive bowel sounds, soft nontender to palpation, nondistended Extremities: No clubbing, cyanosis, or edema Skin: No evidence of breakdown, no evidence of rash  Neurological: often shaking head no.     Mental Status: She is alert.     Comments: Pt is alert. Follows simple basic commands. Nods head "yes/no" to simple questions. Exp>rec aphasia. Did not engage much however during exam due to mood. RUE tr/5. RLE 1-2/5 prox to 0-trace distally (poor effort). Moves left arm and leg freely. Decreased pain sense Right arm and leg. Does have resting flexor tone right wrist and elbow.   GU: improving continence      Assessment/Plan: 1. Functional deficits which require 3+ hours per day of interdisciplinary therapy in a comprehensive inpatient rehab setting. Physiatrist is providing close team supervision and 24 hour management of active medical problems listed below. Physiatrist and rehab team continue to assess barriers to discharge/monitor  patient progress toward functional and medical goals  Care Tool:  Bathing    Body parts bathed by patient: Face   Body parts bathed by helper: Buttocks, Front perineal area, Left lower leg, Right lower leg, Face, Abdomen, Chest, Right arm, Left arm     Bathing assist Assist Level: Minimal Assistance - Patient > 75%     Upper Body Dressing/Undressing Upper body dressing   What is the patient wearing?: Pull over shirt    Upper body assist Assist Level: Moderate Assistance - Patient 50 - 74%    Lower Body Dressing/Undressing Lower body dressing      What is the patient wearing?: Pants     Lower body assist Assist for lower body dressing: Maximal Assistance - Patient 25 - 49%     Toileting Toileting    Toileting assist Assist for toileting: Maximal Assistance - Patient 25 - 49%     Transfers Chair/bed transfer  Transfers assist     Chair/bed transfer assist level: Moderate Assistance - Patient 50 - 74%     Locomotion Ambulation   Ambulation assist   Ambulation activity did not occur: Safety/medical concerns  Assist level: Moderate Assistance - Patient 50 - 74% Assistive device: Cane-quad Max distance: 64ft   Walk 10 feet activity  Assist  Walk 10 feet activity did not occur: Safety/medical concerns  Assist level: Moderate Assistance - Patient - 50 - 74% Assistive device: Cane-quad   Walk 50 feet activity   Assist Walk 50 feet with 2 turns activity did not occur: Safety/medical concerns         Walk 150 feet activity   Assist Walk 150 feet activity did not occur: Safety/medical concerns         Walk 10 feet on uneven surface  activity   Assist Walk 10 feet on uneven surfaces activity did not occur: Safety/medical concerns         Wheelchair     Assist Is the patient using a wheelchair?: Yes Type of Wheelchair: Manual    Wheelchair assist level: Minimal Assistance - Patient > 75% Max wheelchair distance: 26ft     Wheelchair 50 feet with 2 turns activity    Assist        Assist Level: Total Assistance - Patient < 25%   Wheelchair 150 feet activity     Assist      Assist Level: Total Assistance - Patient < 25%   Blood pressure 114/81, pulse 82, temperature 98.5 F (36.9 C), resp. rate 18, weight 65.4 kg, SpO2 98 %.  Medical Problem List and Plan: 1. 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 She'll need some motivation at times to participate  Start B/C vitamin complex  Continue CIR- PT, OT and SLP  I have rescheduled her outpatient Botox for February.  2.  Atrial fibrillation/acute renal artery thrombosis: -DVT/anticoagulation:  INR 2.0 today, dose 2mg  warfarin. Messaged Becky to set up appointment at Coumadin clinic Pending hypercoagulable labs, primary care provider could consider transitioning back to NOAC if the work-up is negative.  Placed outpatient hematology referral.              -antiplatelet therapy: continue Aspirin 81 mg daily 3. Pain Management: N/A 4. Situational depression: LCSW to evaluate and provide emotional support             -antipsychotic agents: N/A             -daughter supportive. Pt is unhappy about still being in the hospital. Will need encouragement and positive reinforcement from team. Discuss with daughter whether she may benefit from antidepressant. She does not want an anti-depressant so I will stop the Celexa that was started for her 5. Neuropsych: This patient is not capable of making decisions on her own behalf. 6. Skin/Wound Care: Routine skin care checks 7. Dysphagia: Dys 3 diet. Continue SLP 8.  9.  HTN: Vitals:   10/15/21  2125 10/16/21 0417  BP: 111/83 114/81  Pulse: 64 82  Resp: 17 18  Temp:  98.5 F (36.9 C)  SpO2:  98%    Changed lopressor to 50mg  daily, 62.5mg  at night to avoid bradycardia, seems to be most effective dosing- continue -will not resume magnesium home med as BP is stable -magnesium low but will not supplement given AKI 10: Klebsiella positive, multifocal pyelonephritis with abscess: Infectious disease recommend transition from intravenous cefazolin to cefadroxil twice a day on  discharge to complete a 4-week treatment course.  Stop date of the 10/20/2021. 11: Possible seizure activity:  -continue Keppra 12: Constipation: continue prn miralax 13: AKI: baseline Scr> 0.70. repeat 1/11 14.  Mild anemia: No indication for transfusion.  Monitor H&H.  1/2- Hb dropped slightly to 9.7 from 10.3 15. Suboptimal vitamin D: continue ergocalciferol 50,000U once per week for 7 weeks. 16. Urinary incontinence: improving, continue bladder program  17. Bradycardia: see #9 19. Elevated platelet count: continue to monitor regularly.   LOS: 15 days A FACE TO FACE EVALUATION WAS PERFORMED  Charlett Blake 10/16/2021, 10:32 AM

## 2021-10-16 NOTE — Progress Notes (Signed)
ANTICOAGULATION CONSULT NOTE - Follow Up Consult  Pharmacy Consult for Coumadin Indication:  CVA + Acute renal artery thrombosis   No Known Allergies  Patient Measurements: Weight: 65.4 kg (144 lb 2.9 oz)  Vital Signs: Temp: 98.5 F (36.9 C) (01/14 0417) Temp Source: Oral (01/13 1942) BP: 114/81 (01/14 0417) Pulse Rate: 82 (01/14 0417)  Labs: Recent Labs    10/14/21 0510 10/15/21 0505 10/16/21 0456  LABPROT 25.5* 22.5* 22.0*  INR 2.3* 2.0* 1.9*     Estimated Creatinine Clearance: 42.1 mL/min (A) (by C-G formula based on SCr of 1.16 mg/dL (H)).   Assessment: Anticoag: Recent CVA and h/o AFib > on apix PTA.  Acute renal artery thrombosis >> hep gtt while PTA apix held >> switching AC to warfarin w/ enox bridge in the setting of new clot while on apix and concern for APS >> warfarin - 12/26: Lupus anticoagulant elevated while on IV heparin. Results NOT valid for patient receiving IV heparin at that time). Anticardiolipin ab elevated (true result), Beta 2 glycoprotein negative - INR down to 1.9, Hgb 9.5 stable. Plts 500 elevated. Expect INR should start to increase by tomorrow. If not, then will increase dose at that time.   Goal of Therapy:  INR 2-3 Monitor platelets by anticoagulation protocol: Yes   Plan:  -Coumadin 2mg  po x 1 tonight. -Daily INR, CBC qWed  , Pharm.D. PGY-1 Pharmacy Resident Phone:[phone] 10/16/2021 7:17 AM

## 2021-10-17 LAB — PROTIME-INR
INR: 1.8 — ABNORMAL HIGH (ref 0.8–1.2)
Prothrombin Time: 21 seconds — ABNORMAL HIGH (ref 11.4–15.2)

## 2021-10-17 MED ORDER — WARFARIN SODIUM 3 MG PO TABS
3.0000 mg | ORAL_TABLET | Freq: Once | ORAL | Status: AC
  Administered 2021-10-17: 3 mg via ORAL
  Filled 2021-10-17: qty 1

## 2021-10-17 NOTE — Progress Notes (Signed)
ANTICOAGULATION CONSULT NOTE - Follow Up Consult  Pharmacy Consult for Coumadin Indication:  CVA + Acute renal artery thrombosis   No Known Allergies  Patient Measurements: Weight: 65.4 kg (144 lb 2.9 oz)  Vital Signs: Temp: 99.1 F (37.3 C) (01/15 0458) Temp Source: Oral (01/15 0458) BP: 110/77 (01/15 0458) Pulse Rate: 59 (01/15 0458)  Labs: Recent Labs    10/15/21 0505 10/16/21 0456 10/17/21 0659  LABPROT 22.5* 22.0* 21.0*  INR 2.0* 1.9* 1.8*     Estimated Creatinine Clearance: 42.1 mL/min (A) (by C-G formula based on SCr of 1.16 mg/dL (H)).   Assessment: Anticoag: Recent CVA and h/o AFib > on apix PTA.  Acute renal artery thrombosis >> hep gtt while PTA apix held >> switching AC to warfarin w/ enox bridge in the setting of new clot while on apix and concern for APS >> warfarin - 12/26: Lupus anticoagulant elevated while on IV heparin. Results NOT valid for patient receiving IV heparin at that time). Anticardiolipin ab elevated (true result), Beta 2 glycoprotein negative  -INR continues to trend down to 1.8 today, CBC on 1/11 was stable. No s/sx of bleeding noted. Will increase dose.   Goal of Therapy:  INR 2-3 Monitor platelets by anticoagulation protocol: Yes   Plan:  -Coumadin 3mg  po x 1 tonight. -Daily INR, CBC qWed  Joseph Art, Pharm.D. PGY-1 Pharmacy Resident [id number] 10/17/2021 8:00 AM

## 2021-10-17 NOTE — Progress Notes (Signed)
Occupational Therapy Session Note  Patient Details  Name: Lindsey [name] MRN: [id number] Date of Birth: 05-09-1956  Today's Date: 10/17/2021 OT Individual Time: 1306-1400 OT Individual Time Calculation (min): 54 min    Short Term Goals: Week 1:  OT Short Term Goal 1 (Week 1): Pt will complete sit to stand with no lift and max A in prep for standing ADL. OT Short Term Goal 1 - Progress (Week 1): Met OT Short Term Goal 2 (Week 1): Pt will complete grooming task at sink with no more than min VCs and S. OT Short Term Goal 2 - Progress (Week 1): Met OT Short Term Goal 3 (Week 1): Pt will don shirt with max A. OT Short Term Goal 3 - Progress (Week 1): Met OT Short Term Goal 4 (Week 1): Pt will bathe UB with mod A. OT Short Term Goal 4 - Progress (Week 1): Met Week 2:  OT Short Term Goal 1 (Week 2): STG= LTG d/t ELOS   Skilled Therapeutic Interventions/Progress Updates:  Patient met lying supine in bed. Increased coaxing/encouragement for participation. Mild facial grimacing with deep pressure to RLE. Total A to don footwear and Min A for supine to EOB with increased time/effort. Stand-pivot to wc on R with Mod A and max multimodal cues 2/2 decreased motor planning. Patient then transported to bathroom for stand-pivot to [facility] over standard height toilet in same manner as indicated above. Max A for 3/3 parts of toileting task. Patient then completed hand hygiene seated at sink level in wc with min questions cues and assist for management of RUE. Mod-Max A for UB bathing/dressing and Mod-Max A for LB bathing/dressing in sitting standing. Patient with difficulty making needs known and responding to therapists questions 2/2 aphasia. She with say "yes" but shake her head no at the same time. Return to supine with assist to elevated BLE from EOB to bed surface. Session concluded with patient lying supine in bed with call bell within reach, bed alarm activated and all needs met.   Therapy  Documentation Precautions:  Precautions Precautions: Fall, Other (comment) Precaution Comments: Baseline R-side hemiplegia; global aphasia Required Braces or Orthoses: Other Brace, Splint/Cast Splint/Cast: Baseline RUE resting hand splint Other Brace: Baseline R AFO for ambulation Restrictions Weight Bearing Restrictions: No General:    Therapy/Group: Individual Therapy  [name] 10/17/2021, 1:27 PM

## 2021-10-17 NOTE — Progress Notes (Addendum)
SLP Cancellation Note  Patient Details Name: Lindsey [name] MRN: [id number] DOB: September 04, 1956   Cancelled treatment:            Declined ST today. Pt appeared to be sad. SLP attempted to engage her about family and pt continuously shook her head no for 10 min. When asked if she wanted SLP to leave, she said, "yes." SLP attempted to clarify; however, communication and body language (turning back to, looking away) were consistent with verbalizations. SLP left pt in room, bed alarm activated, and all immediate needs within reach.                                                                                       [employer] MS, CCC-SLP, CBIS  10/17/2021, 10:19 AM

## 2021-10-17 NOTE — Progress Notes (Deleted)
Declined ST today. Pt appeared to be sad. SLP attempted to engage her about family and pt continuously shook her head no for 10 min. When asked if she wanted SLP to leave, she said, "yes." SLP attempted to clarify; however, communication and body language (turning back to, looking away) were consistent with verbalizations.

## 2021-10-18 ENCOUNTER — Inpatient Hospital Stay: Payer: Medicare Other | Admitting: Internal Medicine

## 2021-10-18 MED ORDER — SERTRALINE HCL 50 MG PO TABS
25.0000 mg | ORAL_TABLET | Freq: Every day | ORAL | Status: DC
  Administered 2021-10-18 – 2021-10-21 (×4): 25 mg via ORAL
  Filled 2021-10-18 (×6): qty 1

## 2021-10-18 MED ORDER — BACLOFEN 5 MG HALF TABLET
5.0000 mg | ORAL_TABLET | Freq: Every day | ORAL | Status: DC
  Administered 2021-10-18 – 2021-10-21 (×4): 5 mg via ORAL
  Filled 2021-10-18 (×4): qty 1

## 2021-10-18 MED ORDER — WARFARIN SODIUM 2.5 MG PO TABS
2.5000 mg | ORAL_TABLET | Freq: Every day | ORAL | Status: DC
  Administered 2021-10-18 – 2021-10-21 (×4): 2.5 mg via ORAL
  Filled 2021-10-18 (×4): qty 1

## 2021-10-18 NOTE — Plan of Care (Deleted)
Problem: RH Dressing Goal: LTG Patient will perform upper body dressing (OT) Description: LTG Patient will perform upper body dressing with assist, with/without cues (OT). Outcome: Completed/Met

## 2021-10-18 NOTE — Progress Notes (Signed)
ANTICOAGULATION CONSULT NOTE - Follow Up Consult  Pharmacy Consult for Coumadin Indication:  CVA + Acute renal artery thrombosis   No Known Allergies  Patient Measurements: Weight: 65.4 kg (144 lb 2.9 oz)  Vital Signs: Temp: 99.1 F (37.3 C) (01/16 0531) BP: 111/66 (01/16 0531) Pulse Rate: 81 (01/16 0531)  Labs: Recent Labs    10/16/21 0456 10/17/21 0659  LABPROT 22.0* 21.0*  INR 1.9* 1.8*     Estimated Creatinine Clearance: 42.1 mL/min (A) (by C-G formula based on SCr of 1.16 mg/dL (H)).   Assessment: Anticoag: Recent CVA and h/o AFib > on apix PTA.  Acute renal artery thrombosis >> hep gtt while PTA apix held >> switching AC to warfarin w/ enox bridge in the setting of new clot while on apix and concern for APS >> warfarin   Goal of Therapy:  INR 2-3 Monitor platelets by anticoagulation protocol: Yes   Plan:  -Try Coumadin 2.5 mg po daily -Daily INR, CBC qWed  Thank you Anette Guarneri, PharmD 10/18/2021 8:01 AM

## 2021-10-18 NOTE — Progress Notes (Signed)
Speech Language Pathology Daily Session Note  Patient Details  Name: Lindsey [name] MRN: [id number] Date of Birth: January 27, 1956  Today's Date: 10/18/2021 SLP Individual Time: 1035-1100 SLP Individual Time Calculation (min): 25 min  Short Term Goals: Week 3: SLP Short Term Goal 1 (Week 3): STG=LTG (ELOS: discharge 1/20)  Skilled Therapeutic Interventions:Skilled ST services focused on language skills. SLP facilitated receptive language skills in identify actions in black/white drawings, pt demonstrated 5/10 accuracy. Pt was able to increase accuracy with max A semantic cues to 3/5 accuracy in 2nd trial. Pt was able to identify 5/6 pictures when named on 6 celled ADL communication board. Pt required min A verbal cues to clarify yes/no response, with verbal response appearing to be more accurate than gesture. Pt was left in room with call bell within reach and chair alarm set. SLP recommends to continue skilled services.     Pain Pain Assessment Pain Score: 0-No pain  Therapy/Group: Individual Therapy  [name]  [facility] 10/18/2021, 12:48 PM

## 2021-10-18 NOTE — Progress Notes (Signed)
Occupational Therapy Weekly Progress Note  Patient Details  Name: Lindsey [name] MRN: [id number] Date of Birth: 11-03-1955  Beginning of progress report period: October 08, 2021 End of progress report period: October 18, 2021  Today's Date: 10/18/2021 OT Individual Time: 1315-1400 OT Individual Time Calculation (min): 45 min    Patient has met 4 of 13 long term goals.  Pt is making slow progress with primary limiting factors being significant apathy and poor participation during therapy sessions.  Pt is also limited by poor attention, significant right sided neglect, hypertonicity of RUE, and apparent fear of falling.  Pt currently requires min assist for UB dressing needing max cues.  She needs mod assist for UB bathing and mod/max assist for LB bathing, dressing, and toileting.  She also needs mod assist for toilet transfers at stand pivot level.  Pts daughter present during todays session and encouraging her mom throughout to increase participation, however with little carryover demonstrated by pt.  Pt will need 24 hour supervision and physical assist at time of discharge.    Patient continues to demonstrate the following deficits: muscle weakness, muscle joint tightness, and muscle paralysis, decreased cardiorespiratoy endurance, impaired timing and sequencing, abnormal tone, unbalanced muscle activation, motor apraxia, decreased coordination, and decreased motor planning, decreased attention to right, right side neglect, and decreased motor planning, decreased initiation, decreased attention, decreased awareness, decreased problem solving, decreased safety awareness, decreased memory, and delayed processing, and decreased sitting balance, decreased standing balance, decreased postural control, hemiplegia, and decreased balance strategies and therefore will continue to benefit from skilled OT intervention to enhance overall performance with BADL.  Patient not progressing toward long term goals.   See goal revision. LTG downgraded. See POC note for details.   OT Short Term GoalsWeek 3:  OT Short Term Goal 1 (Week 3): STGs=LTGs due to ELOS  Skilled Therapeutic Interventions/Progress Updates:    First session: Pt sitting up in w/c, shaking head repeatedly no when OT providing various options for today session.  No visible signs of pain.  Pt continuing to shake head no despite OT educating pt on benefits and importance of participating with OT and encouragement to find pt centered treatment.  Discontinued session per pt refusal.  Call bell in reach, seat alarm on. Second session: Pt sitting up in w/c, daughter now present for session.  Again pt shaking her head no to participating with OT.  Daughter encouraged pt and pleaded for her to participate. Daughter asking OT to notify MD of request for medication to address pts depression issues. Noted RUE with increased hypertonicity and spasticity in shoulder. Gentle PROM within hemi shoulder precautions performed briefly prior to completing self care. Pt reluctantly agreed to sinkside self care.  Pt doffed shirt with max cues using hemi technique and min assist.  Pt bathed UB with mod assist including manual positioning of RUE away from body so pt could wash under arm and also providing hand over hand of right to bathe left side of body.  Will benefit from training on long handled sponge to increase independence with bathing UB and LB.  Pt donned shirt needing max repetitive cues to utilize hemi strategy of threading RUE first and overall min assist to complete secondary to significant right sided neglect.  Daughter reports she will try to be here for tomorrows sessions as well to encourage pt to participate.  Call bell in reach, seat alarm on at end of session.    Therapy Documentation Precautions:  Precautions Precautions: Fall, Other (  comment) Precaution Comments: Baseline R-side hemiplegia; global aphasia Required Braces or Orthoses: Other Brace,  Splint/Cast Splint/Cast: Baseline RUE resting hand splint Other Brace: Baseline R AFO for ambulation Restrictions Weight Bearing Restrictions: No    Therapy/Group: Individual Therapy  Ezekiel Slocumb 10/18/2021, 3:06 PM

## 2021-10-18 NOTE — Plan of Care (Signed)
°  Problem: RH Ambulation Goal: LTG Patient will ambulate in home environment (PT) Description: LTG: Patient will ambulate in home environment, # of feet with assistance (PT). Outcome: Not Applicable Note: D/c goal, pt will not be functional ambulatory in home at time of d/c   Problem: RH Wheelchair Mobility Goal: LTG Patient will propel w/c in controlled environment (PT) Description: LTG: Patient will propel wheelchair in controlled environment, # of feet with assist (PT) Flowsheets (Taken 10/18/2021 1431) LTG: Pt will propel w/c in controlled environ  assist needed:: Minimal Assistance - Patient > 75% LTG: Propel w/c distance in controlled environment: 68ft Note: Downgraded due to lack of progress Goal: LTG Patient will propel w/c in home environment (PT) Description: LTG: Patient will propel wheelchair in home environment, # of feet with assistance (PT). Flowsheets (Taken 10/18/2021 1431) LTG: Pt will propel w/c in home environ  assist needed:: Minimal Assistance - Patient > 75% Distance: wheelchair distance in controlled environment: 10 Note: Downgraded due to lack of progress

## 2021-10-18 NOTE — Progress Notes (Signed)
Physical Therapy Weekly Progress Note  Patient Details  Name: Lindsey [name] MRN: [id number] Date of Birth: Dec 13, 1955  Beginning of progress report period: October 11, 2021 End of progress report period: October 18, 2021  Today's Date: 10/18/2021 PT Individual Time: 0800-0910 PT Individual Time Calculation (min): 70 min   Patient has met 8 of 10 long term goals. Pt making slow progress towards LTGs this reporting period. Progress is limited by poor participation, apathy towards therapy, and high fear of falling. Overall, pt requires minA for bed mobility, minA for sit<>stand, modA stand pivot transfers due to fear of stepping w/ R foot. Able to ambulate ~29f w/ L hand rail minA but modA ambulation ~571fw/ AD. Pt demonstrates ability to weight shift appropriately w/ minA for balance, but does not carry over to functional ambulation w/ AD.  Patient continues to demonstrate the following deficits decreased motor planning and decreased initiation, decreased attention, decreased problem solving, decreased safety awareness, and delayed processing and therefore will continue to benefit from skilled PT intervention to increase functional independence with mobility.  Patient progressing toward long term goals..  Continue plan of care. LTG downgraded. See POC note for details.  PT Short Term Goals Week 2:  PT Short Term Goal 1 (Week 2): STG = LTG due to ELOS  Skilled Therapeutic Interventions/Progress Updates:    Pt met in bed at start of therapy session. After a little bit of persuading, pt agreed to therapy. Pt donned pants in supine w/ attempted bridge but unable to clear hips to pull pants over hips, AFO on R, and shoes in sitting w/ total assist for time. Bed mobility minA and S for EOB balance. LPN present for morning meds. Stand pivot w/c transfer modA and counting 1,2,3 w/ PT prior to sit<>stand. Pt taken to [id number] gym in w/c for time. Completed standing reaching across body to horseshoes on R  of table at counter height to put on top of mirror on L, minA for static balance and weight shift. Forward weight shift to grab checkers, CGA for static balance w/ counter support for hips. Pt demonstrated ability to step 1 step forward from w/c to table and weight shift w/ minA for balance. Pt taken to hallway for gait trial w/ RW and QC. Pt modA w/ RW and QC and only took a few steps due to severe fear of falling even w/ +2 for balance. Pt ambulated ~35 ft w/ L hand rail minA for balance demonstrating ability to weight shift and clear R foot w/o assistance. Pt instructed to propel w/c w/ LLE ~25 ft but only completed ~7ft33ffore stopping and refusing to continue. Pt transported back to room in w/c and left sitting in chair w/ alarm belt and call button, all needs met. [facility], minA for sit<>stand, modA stand pivot transfers due to fear of stepping w/ R foot.  Therapy Documentation Precautions:  Precautions Precautions: Fall, Other (comment) Precaution Comments: Baseline R-side hemiplegia; global aphasia Required Braces or Orthoses: Other Brace, Splint/Cast Splint/Cast: Baseline RUE resting hand splint Other Brace: Baseline R AFO for ambulation Restrictions Weight Bearing Restrictions: No General:       Therapy/Group: Individual Therapy  ConnLucile ShuttersT  10/18/2021, 8:42 AM

## 2021-10-18 NOTE — Progress Notes (Signed)
PROGRESS NOTE   Subjective/Complaints: Nonverbal Shakes head no Warfarin 2.5 daily Eager to d/c home  ROS: Unable to assess due to aphasia  Objective:   No results found. No results for input(s): WBC, HGB, HCT, PLT in the last 72 hours.   No results for input(s): NA, K, CL, CO2, GLUCOSE, BUN, CREATININE, CALCIUM in the last 72 hours.     Intake/Output Summary (Last 24 hours) at 10/18/2021 1023 Last data filed at 10/18/2021 0749 Gross per 24 hour  Intake 593 ml  Output --  Net 593 ml        Physical Exam: Vital Signs Blood pressure 122/67, pulse 80, temperature 99.1 F (37.3 C), resp. rate 16, weight 65.4 kg, SpO2 99 %. Gen: no distress, normal appearing HEENT: oral mucosa pink and moist, NCAT Cardio: Reg rate Chest: normal effort, normal rate of breathing Abd: soft, non-distended Ext: no edema Psych: pleasant, normal affect Skin: intact  Neurological: often shaking head no.     Mental Status: She is alert.     Comments: Pt is alert. Follows simple basic commands. Nods head "yes/no" to simple questions. Exp>rec aphasia. Did not engage much however during exam due to mood. RUE tr/5. RLE 1-2/5 prox to 0-trace distally (poor effort). Moves left arm and leg freely. Decreased pain sense Right arm and leg. Does have resting flexor tone right wrist and elbow.   GU: improving continence      Assessment/Plan: 1. Functional deficits which require 3+ hours per day of interdisciplinary therapy in a comprehensive inpatient rehab setting. Physiatrist is providing close team supervision and 24 hour management of active medical problems listed below. Physiatrist and rehab team continue to assess barriers to discharge/monitor patient progress toward functional and medical goals  Care Tool:  Bathing    Body parts bathed by patient: Right arm, Chest, Abdomen, Right upper leg, Left upper leg, Face   Body parts bathed by  helper: Left arm, Buttocks, Front perineal area, Right lower leg, Left lower leg     Bathing assist Assist Level: Moderate Assistance - Patient 50 - 74%     Upper Body Dressing/Undressing Upper body dressing   What is the patient wearing?: Pull over shirt    Upper body assist Assist Level: Moderate Assistance - Patient 50 - 74%    Lower Body Dressing/Undressing Lower body dressing      What is the patient wearing?: Pants, Underwear/pull up     Lower body assist Assist for lower body dressing: Maximal Assistance - Patient 25 - 49%     Toileting Toileting    Toileting assist Assist for toileting: Maximal Assistance - Patient 25 - 49%     Transfers Chair/bed transfer  Transfers assist     Chair/bed transfer assist level: Moderate Assistance - Patient 50 - 74%     Locomotion Ambulation   Ambulation assist   Ambulation activity did not occur: Safety/medical concerns  Assist level: Moderate Assistance - Patient 50 - 74% Assistive device: Cane-quad Max distance: 81ft   Walk 10 feet activity   Assist  Walk 10 feet activity did not occur: Safety/medical concerns  Assist level: Moderate Assistance - Patient - 50 - 74%  Assistive device: Cane-quad   Walk 50 feet activity   Assist Walk 50 feet with 2 turns activity did not occur: Safety/medical concerns         Walk 150 feet activity   Assist Walk 150 feet activity did not occur: Safety/medical concerns         Walk 10 feet on uneven surface  activity   Assist Walk 10 feet on uneven surfaces activity did not occur: Safety/medical concerns         Wheelchair     Assist Is the patient using a wheelchair?: Yes Type of Wheelchair: Manual    Wheelchair assist level: Minimal Assistance - Patient > 75% Max wheelchair distance: 34ft    Wheelchair 50 feet with 2 turns activity    Assist        Assist Level: Total Assistance - Patient < 25%   Wheelchair 150 feet activity      Assist      Assist Level: Total Assistance - Patient < 25%   Blood pressure 122/67, pulse 80, temperature 99.1 F (37.3 C), resp. rate 16, weight 65.4 kg, SpO2 99 %.  Medical Problem List and Plan: 1. 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 She'll need some motivation at times to participate  Start B/C vitamin complex  Continue CIR- PT, OT and SLP  I have rescheduled her outpatient Botox for February.  2.  Atrial fibrillation/acute renal artery thrombosis: -DVT/anticoagulation:  INR 1.8 1/15, dose 2.5mg  warfarin. Messaged Becky to set up appointment at Coumadin clinic Pending hypercoagulable labs, primary care provider could consider transitioning back to NOAC if the work-up is negative.  Placed outpatient hematology referral.              -antiplatelet therapy: continue Aspirin 81 mg daily 3. Pain Management: N/A 4. Situational depression: LCSW to evaluate and provide emotional support             -antipsychotic agents: N/A             -daughter supportive. Pt is unhappy about still being in the hospital. Will need encouragement and positive reinforcement from team. Discuss with daughter whether she may benefit from antidepressant. She does not want an anti-depressant so I will stop the Celexa that was started for her 5. Neuropsych: This patient is not capable of making decisions on her own behalf. 6. Skin/Wound Care: Routine skin care checks 7. Dysphagia: Dys 3 diet. Continue SLP 8.  9.  HTN: Vitals:   10/18/21 0531 10/18/21 0821  BP: 111/66 122/67  Pulse: 81 80  Resp: 16   Temp: 99.1 F (37.3 C)   SpO2: 99%     Continue lopressor to 50mg  daily, 62.5mg   at night to avoid bradycardia, seems to be most effective dosing- continue -will not resume magnesium home med as BP is stable -magnesium low but will not supplement given AKI 10: Klebsiella positive, multifocal pyelonephritis with abscess: Infectious disease recommend transition from intravenous cefazolin to cefadroxil twice a day on discharge to complete a 4-week treatment course.  Stop date of the 10/20/2021. 11: Possible seizure activity:  -continue Keppra 12: Constipation:  continue prn miralax 13: AKI: baseline Scr> 0.70. repeat 1/11 14.  Mild anemia: No indication for transfusion.  Monitor H&H.  1/2- Hb dropped slightly to 9.7 from 10.3 15. Suboptimal vitamin D: continue ergocalciferol 50,000U once per week for 7 weeks. 16. Urinary incontinence: improving, continue bladder program  17. Bradycardia: see #9 19. Elevated platelet count: continue to monitor regularly.   LOS: 17 days A FACE TO FACE EVALUATION WAS PERFORMED  Martha Clan P Amy Gothard 10/18/2021, 10:23 AM

## 2021-10-18 NOTE — Plan of Care (Signed)
Problem: RH Dressing Goal: LTG Patient will perform upper body dressing (OT) Description: LTG Patient will perform upper body dressing with assist, with/without cues (OT). Outcome: Completed/Met   Problem: RH Functional Use of Upper Extremity Goal: LTG Patient will use RT/LT upper extremity as a (OT) Description: LTG: Patient will use right/left upper extremity as a stabilizer/gross assist/diminished/nondominant/dominant level with assist, with/without cues during functional activity (OT) Outcome: Not Applicable Flowsheets (Taken 10/18/2021 1500) LTG: Use of upper extremity in functional activities: (Pt unable to use RUE functional at baseline due to spasticity and hypertonicity. Discontinue goal.) --   Problem: Sit to Stand Goal: LTG:  Patient will perform sit to stand in prep for activites of daily living with assistance level (OT) Description: LTG:  Patient will perform sit to stand in prep for activites of daily living with assistance level (OT) Flowsheets (Taken 10/18/2021 1500) LTG: PT will perform sit to stand in prep for activites of daily living with assistance level: (downgraded due to lack of progress) Minimal Assistance - Patient > 75%   Problem: RH Eating Goal: LTG Patient will perform eating w/assist, cues/equip (OT) Description: LTG: Patient will perform eating with assist, with/without cues using equipment (OT) Flowsheets (Taken 10/18/2021 1500) LTG: Pt will perform eating with assistance level of: (downgraded due to lack of progress) Supervision/Verbal cueing   Problem: RH Grooming Goal: LTG Patient will perform grooming w/assist,cues/equip (OT) Description: LTG: Patient will perform grooming with assist, with/without cues using equipment (OT) Flowsheets (Taken 10/18/2021 1500) LTG: Pt will perform grooming with assistance level of: (downgraded due to lack of progress) Minimal Assistance - Patient > 75%   Problem: RH Dressing Goal: LTG Patient will perform lower body  dressing w/assist (OT) Description: LTG: Patient will perform lower body dressing with assist, with/without cues in positioning using equipment (OT) Flowsheets (Taken 10/18/2021 1500) LTG: Pt will perform lower body dressing with assistance level of: (downgraded due to lack of progress) Moderate Assistance - Patient 50 - 74%   Problem: RH Toileting Goal: LTG Patient will perform toileting task (3/3 steps) with assistance level (OT) Description: LTG: Patient will perform toileting task (3/3 steps) with assistance level (OT)  Flowsheets (Taken 10/18/2021 1500) LTG: Pt will perform toileting task (3/3 steps) with assistance level: (downgraded due to lack of progress) Moderate Assistance - Patient 50 - 74%   Problem: RH Toilet Transfers Goal: LTG Patient will perform toilet transfers w/assist (OT) Description: LTG: Patient will perform toilet transfers with assist, with/without cues using equipment (OT) Flowsheets (Taken 10/18/2021 1500) LTG: Pt will perform toilet transfers with assistance level of: (downgraded due to lack of progress) Moderate Assistance - Patient 50 - 74%

## 2021-10-19 LAB — PROTIME-INR
INR: 2 — ABNORMAL HIGH (ref 0.8–1.2)
Prothrombin Time: 22.2 seconds — ABNORMAL HIGH (ref 11.4–15.2)

## 2021-10-19 NOTE — Progress Notes (Signed)
Speech Language Pathology Daily Session Note  Patient Details  Name: Lindsey [name] MRN: [id number] Date of Birth: 11-11-1955  Today's Date: 10/19/2021 SLP Individual Time: 1500-1530 SLP Individual Time Calculation (min): 30 min  Short Term Goals: Week 3: SLP Short Term Goal 1 (Week 3): STG=LTG (ELOS: discharge 1/20)  Skilled Therapeutic Interventions: Skilled ST treatment focused on swallowing and communication goals. Patient was greeted supine in bed and accompanied by her grandson. Patient responded to yes/no questions with min A for clarification. Patient most consistently nodded her head yes/no and occasionally gave verbal responses with whisper. Pt agreeable to regular texture PO trials. Pt consumed hard cookie with mildly prolonged yet effective mastication and mild oral stasis requiring liquid rinse to clear. Pt aware of trace anterior spillage on right side and cleared with lingual sweep. No overt s/sx of aspiration noted with both regular solids and thin liquids. Recommend additional trials of regular solids prior to consideration of diet advancement. Educated grandson and patient on plan for additional trials tomorrow. Pt expressed "yes" when asked "would that be okay with you?" Patient was left in bed with alarm activated and immediate needs within reach at end of session. Continue per current plan of care.      Pain Pain Assessment Pain Scale: 0-10 Pain Score: 0-No pain  Therapy/Group: Individual Therapy  [name] 10/19/2021, 3:28 PM

## 2021-10-19 NOTE — Progress Notes (Signed)
Physical Therapy Session Note  Patient Details  Name: Lindsey [name] MRN: [id number] Date of Birth: Jul 19, 1956  Today's Date: 10/19/2021 PT Individual Time: 0915-1010 PT Individual Time Calculation (min): 55 min   Short Term Goals: Week 3:  PT Short Term Goal 1 (Week 3): STG = LTG due to ELOS  Skilled Therapeutic Interventions/Progress Updates:     Pt presents supine in bed to start - shaking head 'no' to PT tx but agreeable with direct cues and coaxing. Pt with inconsistent use of yes/no communication board. Donned socks at bed level with totalA for time. Supine<>sit with minA for RLE management and heavy reliance of bed rail to support trunk (pt has hospital bed at home). Donned R AFO and tennis shoes with totalA for time. Completed stand<>pivot transfer with minA from EOB to w/c with pt improving ability to weight shift and initiate steps during transfer. Transported in w/c to main rehab gym for time and wheeled in front of smaller, 3inch steps. Worked on unilateral toe taps to 3inch step using LUE to hand rail for support and minA for balance - 2x5 + 2x5 (seated rest) on L and 2x5 + 2x5 (seated rest) on R (required AAROM for placing R foot onto step due to insufficient hip flexion). Worked on lateral weight shift during taps which she did well with and improved confidence with repetitions. Transitioned to gait training to assess carryover into functional tasks. Pt completed sit<>stand to QC with minA with VC needed for RUE placement to armrests - ambulated ~39ft + ~38ft with minA and QC - pt frequently attempted to sit and "give up" during gait, requiring max cues for encouragement and effort. Demonstrates inconsistent ability to initiate RLE swing with step-to gait pattern, also distracted from external environment. Pt returned to room in w/c due to fatigue - requesting to use bathroom and communicating this via communication board. LPN notified with direct handoff of care with pt sitting in w/c at  end of session.   Therapy Documentation Precautions:  Precautions Precautions: Fall, Other (comment) Precaution Comments: Baseline R-side hemiplegia; global aphasia Required Braces or Orthoses: Other Brace, Splint/Cast Splint/Cast: Baseline RUE resting hand splint Other Brace: Baseline R AFO for ambulation Restrictions Weight Bearing Restrictions: No General:    Therapy/Group: Individual Therapy  Raahim Shartzer P Taji Sather 10/19/2021, 7:59 AM

## 2021-10-19 NOTE — Progress Notes (Signed)
Speech Language Pathology Daily Session Note  Patient Details  Name: Lindsey [name] MRN: [id number] Date of Birth: [birth year]  Today's Date: 10/19/2021 SLP Individual Time: [id number] SLP Individual Time Calculation (min): 25 min  Short Term Goals: Week 3: SLP Short Term Goal 1 (Week 3): STG=LTG (ELOS: discharge 1/20)  Skilled Therapeutic Interventions:Skilled ST services focused on language skills. SLP facilitated expressing thought in written responses. Pt was able to write both first and last name mod I, however perseverated on this for the rest of the task. Pt was only able to trace new requested letter/word (pen, cup, bed) with hand over hand assist. Pt's gestural and verbal response to yes/no questions appeared contradictory with 60% accuracy when given questions about wants/needs. Pt's response accuracy increased with use of paper communication board, selecting yes/no to support verbal/gestural response when asking questions about family member names increased to 80% accuracy. Pt was left in room with call bell within reach and bed alarm set. SLP recommends to continue skilled services.     Pain Pain Assessment Pain Score: 0-No pain  Therapy/Group: Individual Therapy  Valeria Boza  [facility] 10/19/2021, 12:43 PM

## 2021-10-19 NOTE — Progress Notes (Signed)
ANTICOAGULATION CONSULT NOTE - Follow Up Consult  Pharmacy Consult for Coumadin Indication:  CVA + Acute renal artery thrombosis   No Known Allergies  Patient Measurements: Weight: 65.4 kg (144 lb 2.9 oz)  Vital Signs: Temp: 98.4 F (36.9 C) (01/17 0622) BP: 101/64 (01/17 0815) Pulse Rate: 69 (01/17 0815)  Labs: Recent Labs    10/17/21 0659 10/19/21 0537  LABPROT 21.0* 22.2*  INR 1.8* 2.0*     Estimated Creatinine Clearance: 42.1 mL/min (A) (by C-G formula based on SCr of 1.16 mg/dL (H)).   Assessment: Anticoag: Recent CVA and h/o AFib > on apix PTA.  Acute renal artery thrombosis >> hep gtt while PTA apix held >> switching AC to warfarin w/ enox bridge in the setting of new clot while on apix and concern for APS >> warfarin  INR came back therapeutic today. We will continue with the current daily dose.  Goal of Therapy:  INR 2-3 Monitor platelets by anticoagulation protocol: Yes   Plan:  -Coumadin 2.5 mg po daily -Daily INR, CBC qWed  Ulyses Southward, PharmD, [place], AAHIVP, CPP Infectious Disease Pharmacist 10/19/2021 9:55 AM

## 2021-10-19 NOTE — Progress Notes (Signed)
Occupational Therapy Session Note  Patient Details  Name: Lindsey [name] MRN: [id number] Date of Birth: 04/05/1956  Today's Date: 10/19/2021 OT Individual Time: 1300-1415 OT Individual Time Calculation (min): 75 min    Short Term Goals: Week 3:  OT Short Term Goal 1 (Week 3): STGs=LTGs due to ELOS  Skilled Therapeutic Interventions/Progress Updates:    Pt sitting up in w/c, grandson present for session.  Pt with no visible signs of pain.  Pt at first slightly hesitant to shower however with encouragement, pt stating "yes" in agreement.  Sit to stand with min assist using quad cane and ambulated approximately 10 feet to bathroom with min-mod assist for weight shifting and cues for step sequencing. Shower bench transfer with mod assist.  Pt doffed shirt with min assist and pants/brief with mod assist at sit<>stand level. Doffed socks, AFO, and shoes with max assist. Pt bathed UB with min assist to manually elevate RUE due to spasticity. Pt bathed LB in seated position not including buttocks with min assist for bilateral feet.  Pt dried off then completed stand pivot with mod assist using grab bar.  Pt transported to sink and donned shirt with min assist, donned brief and pants with mod assist at sit<>stand level. Socks donned with total assist for time mgt. Pt ambulated approximately 5 feet from w/c to EOB using quad cane with mod assist.  Sit to supine with min assist for LLE support.  Call bell in reach, bed alarm on at end of session.  Therapy Documentation Precautions:  Precautions Precautions: Fall, Other (comment) Precaution Comments: Baseline R-side hemiplegia; global aphasia Required Braces or Orthoses: Other Brace, Splint/Cast Splint/Cast: Baseline RUE resting hand splint Other Brace: Baseline R AFO for ambulation Restrictions Weight Bearing Restrictions: No    Therapy/Group: Individual Therapy  Ezekiel Slocumb 10/19/2021, 3:31 PM

## 2021-10-19 NOTE — Progress Notes (Signed)
PROGRESS NOTE   Subjective/Complaints: No new complaints this morning Coumadin 2.5mg  daily INR therapeutic Daily INRs  ROS: Unable to assess due to aphasia  Objective:   No results found. No results for input(s): WBC, HGB, HCT, PLT in the last 72 hours.   No results for input(s): NA, K, CL, CO2, GLUCOSE, BUN, CREATININE, CALCIUM in the last 72 hours.     Intake/Output Summary (Last 24 hours) at 10/19/2021 1212 Last data filed at 10/19/2021 [id number] Gross per 24 hour  Intake 240 ml  Output --  Net 240 ml        Physical Exam: Vital Signs Blood pressure 101/64, pulse 69, temperature 98.4 F (36.9 C), resp. rate 16, weight 65.4 kg, SpO2 100 %. Gen: no distress, normal appearing HEENT: oral mucosa pink and moist, NCAT Cardio: Reg rate Chest: normal effort, normal rate of breathing Abd: soft, non-distended Ext: no edema Psych: pleasant, normal affect Skin: intact  Neurological: often shaking head no.     Mental Status: She is alert.     Comments: Pt is alert. Follows simple basic commands. Nods head "yes/no" to simple questions. Exp>rec aphasia. Did not engage much however during exam due to mood. RUE tr/5. RLE 1-2/5 prox to 0-trace distally (poor effort). Moves left arm and leg freely. Decreased pain sense Right arm and leg. Does have resting flexor tone right wrist and elbow.   GU: improving continence      Assessment/Plan: 1. Functional deficits which require 3+ hours per day of interdisciplinary therapy in a comprehensive inpatient rehab setting. Physiatrist is providing close team supervision and 24 hour management of active medical problems listed below. Physiatrist and rehab team continue to assess barriers to discharge/monitor patient progress toward functional and medical goals  Care Tool:  Bathing    Body parts bathed by patient: Right arm, Chest, Abdomen, Right upper leg, Left upper leg, Face    Body parts bathed by helper: Left arm, Buttocks, Front perineal area, Right lower leg, Left lower leg     Bathing assist Assist Level: Moderate Assistance - Patient 50 - 74%     Upper Body Dressing/Undressing Upper body dressing   What is the patient wearing?: Pull over shirt    Upper body assist Assist Level: Moderate Assistance - Patient 50 - 74%    Lower Body Dressing/Undressing Lower body dressing      What is the patient wearing?: Pants, Underwear/pull up     Lower body assist Assist for lower body dressing: Maximal Assistance - Patient 25 - 49%     Toileting Toileting    Toileting assist Assist for toileting: Maximal Assistance - Patient 25 - 49%     Transfers Chair/bed transfer  Transfers assist     Chair/bed transfer assist level: Moderate Assistance - Patient 50 - 74%     Locomotion Ambulation   Ambulation assist   Ambulation activity did not occur: Safety/medical concerns  Assist level: Moderate Assistance - Patient 50 - 74% Assistive device: Cane-quad Max distance: 75ft   Walk 10 feet activity   Assist  Walk 10 feet activity did not occur: Safety/medical concerns  Assist level: Moderate Assistance - Patient - 77 -  74% Assistive device: Cane-quad   Walk 50 feet activity   Assist Walk 50 feet with 2 turns activity did not occur: Safety/medical concerns         Walk 150 feet activity   Assist Walk 150 feet activity did not occur: Safety/medical concerns         Walk 10 feet on uneven surface  activity   Assist Walk 10 feet on uneven surfaces activity did not occur: Safety/medical concerns         Wheelchair     Assist Is the patient using a wheelchair?: Yes Type of Wheelchair: Manual    Wheelchair assist level: Minimal Assistance - Patient > 75% Max wheelchair distance: 63ft    Wheelchair 50 feet with 2 turns activity    Assist        Assist Level: Total Assistance - Patient < 25%   Wheelchair 150  feet activity     Assist      Assist Level: Total Assistance - Patient < 25%   Blood pressure 101/64, pulse 69, temperature 98.4 F (36.9 C), resp. rate 16, weight 65.4 kg, SpO2 100 %.  Medical Problem List and Plan: 1. 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 She'll need some motivation at times to participate  Start B/C vitamin complex  Continue CIR- PT, OT and SLP  I have rescheduled her outpatient Botox for February.  2.  Atrial fibrillation/acute renal artery thrombosis: -DVT/anticoagulation:  INR 1.8 1/15, dose 2.5mg  warfarin. Messaged Becky to set up appointment at Coumadin clinic Pending hypercoagulable labs, primary care provider could consider transitioning back to NOAC if the work-up is negative.  Placed outpatient hematology referral.              -antiplatelet therapy: continue Aspirin 81 mg daily 3. Pain Management: N/A 4. Situational depression: LCSW to evaluate and provide emotional support             -antipsychotic agents: N/A             -daughter supportive. Pt is unhappy about still being in the hospital. Will need encouragement and positive reinforcement from team. Discuss with daughter whether she may benefit from antidepressant. She does not want an anti-depressant so I will stop the Celexa that was started for her 5. Neuropsych: This patient is not capable of making decisions on her own behalf. 6. Skin/Wound Care: Routine skin care checks 7. Dysphagia: Dys 3 diet. Continue SLP 8.  9.  HTN: Vitals:   10/19/21 0623 10/19/21 0815  BP:  101/64  Pulse: 85 69  Resp:    Temp:    SpO2: 100%     Continue lopressor to 50mg  daily, 62.5mg  at night  to avoid bradycardia, seems to be most effective dosing- continue -will not resume magnesium home med as BP is stable -magnesium low but will not supplement given AKI 10: Klebsiella positive, multifocal pyelonephritis with abscess: Infectious disease recommend transition from intravenous cefazolin to cefadroxil twice a day on discharge to complete a 4-week treatment course.  Stop date of the 10/20/2021. 11: Possible seizure activity:  -continue Keppra 12: Constipation: continue prn  miralax 13: AKI: baseline Scr> 0.70. repeat 1/11 14.  Mild anemia: No indication for transfusion.  Monitor H&H.  1/2- Hb dropped slightly to 9.7 from 10.3 15. Suboptimal vitamin D: continue ergocalciferol 50,000U once per week for 7 weeks. 16. Urinary incontinence: improving, continue bladder program  17. Bradycardia: see #9 19. Elevated platelet count: continue to monitor regularly.   LOS: 18 days A FACE TO FACE EVALUATION WAS PERFORMED  Lissete Maestas P Makyia Erxleben 10/19/2021, 12:12 PM

## 2021-10-20 LAB — PROTIME-INR
INR: 2.1 — ABNORMAL HIGH (ref 0.8–1.2)
Prothrombin Time: 23.1 seconds — ABNORMAL HIGH (ref 11.4–15.2)

## 2021-10-20 LAB — BASIC METABOLIC PANEL
Anion gap: 10 (ref 5–15)
BUN: 8 mg/dL (ref 8–23)
CO2: 22 mmol/L (ref 22–32)
Calcium: 8.7 mg/dL — ABNORMAL LOW (ref 8.9–10.3)
Chloride: 104 mmol/L (ref 98–111)
Creatinine, Ser: 0.98 mg/dL (ref 0.44–1.00)
GFR, Estimated: >60 mL/min (ref >60)
Glucose, Bld: 103 mg/dL — ABNORMAL HIGH (ref 70–99)
Potassium: 3.8 mmol/L (ref 3.5–5.1)
Sodium: 136 mmol/L (ref 135–145)

## 2021-10-20 LAB — CBC
HCT: 30 % — ABNORMAL LOW (ref 36.0–46.0)
Hemoglobin: 10.2 g/dL — ABNORMAL LOW (ref 12.0–15.0)
MCH: 28.9 pg (ref 26.0–34.0)
MCHC: 34 g/dL (ref 30.0–36.0)
MCV: 85 fL (ref 80.0–100.0)
Platelets: 243 10*3/uL (ref 150–400)
RBC: 3.53 MIL/uL — ABNORMAL LOW (ref 3.87–5.11)
RDW: 15.4 % (ref 11.5–15.5)
WBC: 2.6 10*3/uL — ABNORMAL LOW (ref 4.0–10.5)
nRBC: 0 % (ref 0.0–0.2)

## 2021-10-20 MED ORDER — POTASSIUM CHLORIDE 20 MEQ PO PACK
20.0000 meq | PACK | Freq: Once | ORAL | Status: AC
  Administered 2021-10-20: 20 meq via ORAL
  Filled 2021-10-20: qty 1

## 2021-10-20 NOTE — Progress Notes (Signed)
Physical Therapy Session Note  Patient Details  Name: Lindsey [name] MRN: [id number] Date of Birth: 02/01/1956  Today's Date: 10/20/2021 PT Individual Time: 0800-0824 PT Individual Time Calculation (min): 24 min   Short Term Goals: Week 3:  PT Short Term Goal 1 (Week 3): STG = LTG due to ELOS  Skilled Therapeutic Interventions/Progress Updates:     Pt supine in bed with all lights off at start of session. Pt making eye contact but then immediately shaking head 'no' to PT treatment. She appears more sad and flat this session compared to previous sessions. Attempted to use communication board and offer soft encouragement but she continued to shake head 'no'. Her breakfast tray arrived and able to coax pt to get OOB to chair to eat. Supine<>sit with minA with use of hospital bed features. Donned shoes and R AFO with totalA for time. Sit<>stand with minA and then transferred with minA via stand<>pivot to her w/c - improved ability to initiate R stepping during transfer. Pt made comfortable in chair with 1/2 lap tray supporting RUE, safety belt alarm on, and all immediate needs within reach. Direct handoff of care to NT who entered room to assist pt with her breakfast. Pt missed 35 minutes of skilled therapy due to fatigue and breakfast.   Therapy Documentation Precautions:  Precautions Precautions: Fall, Other (comment) Precaution Comments: Baseline R-side hemiplegia; global aphasia Required Braces or Orthoses: Other Brace, Splint/Cast Splint/Cast: Baseline RUE resting hand splint Other Brace: Baseline R AFO for ambulation Restrictions Weight Bearing Restrictions: No General:    Therapy/Group: Individual Therapy  Greydis Stlouis P Ellison Rieth PT 10/20/2021, 7:23 AM

## 2021-10-20 NOTE — Progress Notes (Addendum)
Speech Language Pathology Daily Session Note  Patient Details  Name: Lindsey [name] MRN: [id number] Date of Birth: 1956-08-26  Today's Date: 10/20/2021 SLP Individual Time: 0930-1000 and 1220-1230 SLP Individual Time Calculation (min): 30 min ad 30 minutes  Short Term Goals: Week 3: SLP Short Term Goal 1 (Week 3): STG=LTG (ELOS: discharge 1/20)  Skilled Therapeutic Interventions:   Session 1 (0930-1000): Skilled ST treatment focused on cognitive-communication goals. Pt greeted in wheelchair on arrival and pointed to sink/bathroom area of room. SLP asked patient if she needed to go to the bathroom and she shook her head "no." Patient continued to point to this area so SLP brought patient over to sink where she denied needs at sink by shaking her heads and hands but pointed again to bathroom. When asked again she now verbalized "yes" to request bathroom. SLP provided sup A for sit-to-stand using Stedy. Patient had urine continence and required total A for wiping and donning pants. Patient transferred [name] to wheelchair using Stedy with CGA to sit. SLP requested if patient would be agreeable to regular solid PO trials to which patient both verbalized and shook her head "no". When asked if she would be agreeable to regular texture trial tray at lunch time she expressed "okay." Plan to assess tolerance of regular solids at lunch time. Patient was left in wheelchair with alarm activated and immediate needs within reach at end of session. Continue per current plan of care.    Session 2 (12:20-12:30): Patient seen for skilled assessment of regular texture PO trials. Pt only consumed a few bites of spaghetti w/ meat sauce and presented with functional mastication, complete oral clearance and without overt s/s of aspiration. Patient would not consume additional intake including breadstick and mixed steamed vegetables and has been reported to have had poor intake this admission. Pt appears appropriate for diet  advancement to regular textures/thin liquids and continuation of full supervision during meals. Patient was left in wheelchair with alarm activated and immediate needs within reach at end of session. Continue per current plan of care.      Pain Pain Assessment Pain Scale: 0-10 Pain Score: 0-No pain  Therapy/Group: Individual Therapy  Chennel Olivos T Chemika Nightengale 10/20/2021, 11:15 AM

## 2021-10-20 NOTE — Progress Notes (Signed)
Occupational Therapy Session Note  Patient Details  Name: Lindsey [name] MRN: [id number] Date of Birth: 09/28/1956  Today's Date: 10/20/2021 OT Individual Time: 1100-1154 OT Individual Time Calculation (min): 54 min    Short Term Goals: Week 1:  OT Short Term Goal 1 (Week 1): Pt will complete sit to stand with no lift and max A in prep for standing ADL. OT Short Term Goal 1 - Progress (Week 1): Met OT Short Term Goal 2 (Week 1): Pt will complete grooming task at sink with no more than min VCs and S. OT Short Term Goal 2 - Progress (Week 1): Met OT Short Term Goal 3 (Week 1): Pt will don shirt with max A. OT Short Term Goal 3 - Progress (Week 1): Met OT Short Term Goal 4 (Week 1): Pt will bathe UB with mod A. OT Short Term Goal 4 - Progress (Week 1): Met Week 2:  OT Short Term Goal 1 (Week 2): STG= LTG d/t ELOS Week 3:  OT Short Term Goal 1 (Week 3): STGs=LTGs due to ELOS   Skilled Therapeutic Interventions/Progress Updates:    Pt greeted at time of session sitting up in wheelchair and relayed to the pt time for OT session. Pt shaking head "no" for all forms of communication during session, with saying "yes" at end of session when asked if wanted AAC device. Pt transported to bathroom and performed stand pivot wheelchair > BSC over toilet with heavy Mod A with quad cane and max facilitation of RLE, AFO already on from previous session. In standing, able to assist with clothing management with LUE but needing assist on R side for up/down. Pt did not void and transferred back to wheelchair same manner. Transported to day room and stand pivot wheelchair > mat toward L side Mod A to assist with RLE management. Pt performing several dynamic seated tasks crossing midline to reach for R side for cones on ground level and various colored/numbers dots at seat height with pt able to identify colors easier than numbers. Removed resting hand splint for skin inspection and to weight bear on large therapy  ball, 1x10 weight bearing pressed and 1x10 ball roll outs with Max A to facilitate. Back in room, pt motioning for AAC, provided to the pt but did not communicate, turned off for battery. Pt up in chair RUE elevated and abducted with towel roll, alarm on call bell in reach.   Therapy Documentation Precautions:  Precautions Precautions: Fall, Other (comment) Precaution Comments: Baseline R-side hemiplegia; global aphasia Required Braces or Orthoses: Other Brace, Splint/Cast Splint/Cast: Baseline RUE resting hand splint Other Brace: Baseline R AFO for ambulation Restrictions Weight Bearing Restrictions: No     Therapy/Group: Individual Therapy  Viona Gilmore 10/20/2021, 7:29 AM

## 2021-10-20 NOTE — Progress Notes (Signed)
Occupational Therapy Session Note  Patient Details  Name: Lindsey [name] MRN: [id number] Date of Birth: 03-25-1956  Today's Date: 10/20/2021 OT Individual Time: 1310-1410 OT Individual Time Calculation (min): 60 min    Short Term Goals: Week 3:  OT Short Term Goal 1 (Week 3): STGs=LTGs due to ELOS  Skilled Therapeutic Interventions/Progress Updates:    Pt sitting up in w/c shaking head no but verbally stating yes when asked if she has any pain.  Nurse arrived and offered tylenol however pt shaking head no and appearing to refuse.  Pt stating yes when OT asked if right foot hurt. Total assist to doff right shoe, AFO, and sock.  Noted indentation of skin at distal end of right big toe.  Changed into standard sock with smaller seam (pt was wearing grip sock with large seam which may have been cause of pressure point).  Total assist to donn AFO and shoe as well.  Pt doffed/donned sock and shoe on left with supervision.    Pt stating yes when asked if she needs to use restroom.  Sit to stand with increased time for initiation and needing min assist to shift weight fully over center of gravity.  Pt ambulated with min assist using quad cane to bathroom and completed stand pivot to commode over toilet with mod assist.  Clothing management and pericare completed with min assist.  Continent of bowel (see flowsheet).  Ambulated back to w/c (refusing hand hygiene despite encouragement) with min assist using quad cane and mod assist for stand pivot sit to w/c.    Assessed pts skin RUE after doffing orthosis.  Noted good skin integrity with no signs of pressure areas.  Noted strapping in need of replacing due to ripping in half and providing less support.  Cleaned orthosis and provided new straps.  Pt grimacing and shaking head no when therapist attempted to return lap tray onto w/c.  Educated pt on importance of slighlty abducting shoulder at rest to prevent shoulder contracture and pain.  Pt agreeable to placing  pillow for positioning RUE instead.  Call bell in reach, seat alarm on at end of session.  Therapy Documentation Precautions:  Precautions Precautions: Fall, Other (comment) Precaution Comments: Baseline R-side hemiplegia; global aphasia Required Braces or Orthoses: Other Brace, Splint/Cast Splint/Cast: Baseline RUE resting hand splint Other Brace: Baseline R AFO for ambulation Restrictions Weight Bearing Restrictions: No    Therapy/Group: Individual Therapy  Ezekiel Slocumb 10/20/2021, 2:26 PM

## 2021-10-20 NOTE — Progress Notes (Signed)
PROGRESS NOTE   Subjective/Complaints: No new complaints  INR therapeutic at 2.1, on coumadin 2.5mg  daily Suboptimal potassium today- will given 29meq klor Other labs stable  ROS: Unable to assess due to aphasia  Objective:   No results found. Recent Labs    10/20/21 0537  WBC 2.6*  HGB 10.2*  HCT 30.0*  PLT 243     Recent Labs    10/20/21 0537  NA 136  K 3.8  CL 104  CO2 22  GLUCOSE 103*  BUN 8  CREATININE 0.98  CALCIUM 8.7*       Intake/Output Summary (Last 24 hours) at 10/20/2021 1012 Last data filed at 10/19/2021 1748 Gross per 24 hour  Intake 240 ml  Output --  Net 240 ml        Physical Exam: Vital Signs Blood pressure 102/66, pulse 69, temperature 98.6 F (37 C), temperature source Oral, resp. rate 16, weight 62.6 kg, SpO2 100 %. Gen: no distress, normal appearing HEENT: oral mucosa pink and moist, NCAT Cardio: Reg rate Chest: normal effort, normal rate of breathing Abd: soft, non-distended Ext: no edema Psych: pleasant, normal affect Skin: intact  Neurological: often shaking head no.     Mental Status: She is alert.     Comments: Pt is alert. Follows simple basic commands. Nods head "yes/no" to simple questions. Exp>rec aphasia. Did not engage much however during exam due to mood. RUE tr/5. RLE 1-2/5 prox to 0-trace distally (poor effort). Moves left arm and leg freely. Decreased pain sense Right arm and leg. Does have resting flexor tone right wrist and elbow MAS 2/5 GU: improving continence      Assessment/Plan: 1. Functional deficits which require 3+ hours per day of interdisciplinary therapy in a comprehensive inpatient rehab setting. Physiatrist is providing close team supervision and 24 hour management of active medical problems listed below. Physiatrist and rehab team continue to assess barriers to discharge/monitor patient progress toward functional and medical  goals  Care Tool:  Bathing    Body parts bathed by patient: Right arm, Chest, Abdomen, Right upper leg, Left upper leg, Face   Body parts bathed by helper: Left arm, Buttocks, Front perineal area, Right lower leg, Left lower leg     Bathing assist Assist Level: Moderate Assistance - Patient 50 - 74%     Upper Body Dressing/Undressing Upper body dressing   What is the patient wearing?: Pull over shirt    Upper body assist Assist Level: Moderate Assistance - Patient 50 - 74%    Lower Body Dressing/Undressing Lower body dressing      What is the patient wearing?: Pants, Underwear/pull up     Lower body assist Assist for lower body dressing: Maximal Assistance - Patient 25 - 49%     Toileting Toileting    Toileting assist Assist for toileting: Maximal Assistance - Patient 25 - 49%     Transfers Chair/bed transfer  Transfers assist     Chair/bed transfer assist level: Moderate Assistance - Patient 50 - 74%     Locomotion Ambulation   Ambulation assist   Ambulation activity did not occur: Safety/medical concerns  Assist level: Moderate Assistance - Patient 50 -  74% Assistive device: Cane-quad Max distance: 88ft   Walk 10 feet activity   Assist  Walk 10 feet activity did not occur: Safety/medical concerns  Assist level: Moderate Assistance - Patient - 50 - 74% Assistive device: Cane-quad   Walk 50 feet activity   Assist Walk 50 feet with 2 turns activity did not occur: Safety/medical concerns         Walk 150 feet activity   Assist Walk 150 feet activity did not occur: Safety/medical concerns         Walk 10 feet on uneven surface  activity   Assist Walk 10 feet on uneven surfaces activity did not occur: Safety/medical concerns         Wheelchair     Assist Is the patient using a wheelchair?: Yes Type of Wheelchair: Manual    Wheelchair assist level: Minimal Assistance - Patient > 75% Max wheelchair distance: 44ft     Wheelchair 50 feet with 2 turns activity    Assist        Assist Level: Total Assistance - Patient < 25%   Wheelchair 150 feet activity     Assist      Assist Level: Total Assistance - Patient < 25%   Blood pressure 102/66, pulse 69, temperature 98.6 F (37 C), temperature source Oral, resp. rate 16, weight 62.6 kg, SpO2 100 %.  Medical Problem List and Plan: 1. 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 She'll need some motivation at times to participate  Start B/C vitamin complex  Continue CIR- PT, OT and SLP  I have rescheduled her outpatient Botox for February.   -Interdisciplinary Team Conference today   2.  Atrial fibrillation/acute renal artery thrombosis: -DVT/anticoagulation:  INR 1.8 1/15, dose 2.5mg  warfarin. Messaged Becky to set up appointment at Coumadin clinic Pending hypercoagulable labs, primary care provider could consider transitioning back to NOAC if the work-up is negative.  Placed outpatient hematology referral.              -antiplatelet therapy: continue Aspirin 81 mg daily 3. Pain Management: N/A 4. Situational depression: LCSW to evaluate and provide emotional support             -antipsychotic agents: N/A             -daughter supportive. Pt is unhappy about still being in the hospital. Will need encouragement and positive reinforcement from team. Discuss with daughter whether she may benefit from antidepressant. She does not want an anti-depressant so I will stop the Celexa that was started for her 5. Neuropsych: This patient is not capable of making decisions on her own behalf. 6. Skin/Wound Care: Routine skin care checks 7.  Dysphagia: Dys 3 diet. Continue SLP 8.  9.  HTN: Vitals:   10/19/21 2153 10/20/21 0533  BP: 107/74 102/66  Pulse: 77 69  Resp:  16  Temp: 99.3 F (37.4 C) 98.6 F (37 C)  SpO2: 99% 100%    Supplement 76meq klor.  Continue lopressor to 50mg  daily, 62.5mg  at night to avoid bradycardia, seems to be most effective dosing- continue -will not resume magnesium home med  as BP is stable -magnesium low but will not supplement given AKI 10: Klebsiella positive, multifocal pyelonephritis with abscess: Infectious disease recommend transition from intravenous cefazolin to cefadroxil twice a day on discharge to complete a 4-week treatment course.  Stop date of the 10/20/2021. 11: Possible seizure activity:  -continue Keppra 12: Constipation: continue prn miralax 13: AKI: baseline Scr> 0.70. repeat 1/11 14.  Mild anemia: No indication for transfusion.  Monitor H&H.  1/2- Hb dropped slightly to 9.7 from 10.3 15. Suboptimal vitamin D: continue ergocalciferol 50,000U once per week for 7 weeks. 16. Urinary incontinence: improving, continue bladder program  17. Bradycardia: see #9 19. Elevated platelet count: resolved, monitor as needed.  20. Suboptimal potassium: 70meq klor ordered x1 1/18   LOS: 19 days A FACE TO [place] 10/20/2021, 10:12 AM

## 2021-10-20 NOTE — Patient Care Conference (Signed)
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 Leeroy Cha Social Worker Present: Ovidio Kin, LCSW Nurse Present: Dorien Chihuahua, RN PT Present: Ginnie Smart, PT OT Present: Leretha Pol, OT SLP Present: Sherren Kerns, SLP PPS Coordinator present : Gunnar Fusi, SLP     Current Status/Progress Goal Weekly Team Focus  Bowel/Bladder     Incontinent of bowel and bladder   Incontinence managed   Timed toileting protocol  Swallow/Nutrition/ Hydration   dys 3 textures and thin liquids  regular/thin Supervision  regular trials and use of strategies   ADL's   mod assist functional transfers, max assist LB dressing, min-mod assist UB self care and LB bathing; mod-max assist toileting  min-mod assist; recently downgraded goals due to lack of progress  self care training and hemi techniques, functional transfer training, neuro re-ed and balance, pt and family collaboration and education   Mobility   minA bed mobility, SBA sitting balance, min to modA for stand<>pivot transfers (fluctuates and inconsistent based on effort). Ambulating 5-39ft with skilled minA and QC. Working on weight  shifting, reducing fear of falling, funtional transfers, standing balance  minA short distance gait, supervision w/c mobility. Goals downgraded on 1/17  Participation and effort with therapies, functional transfers, gait training, DC planning   Communication   mod A  mod A  education and use of communication board as simple phrases, yes/no questions   Safety/Cognition/ Behavioral Observations  min A  min A  sustained attention   Pain     Generalized   Pain managed with prn meds   MD added HS baclofen  Skin     N/A          Discharge Planning:  Daughter and grandsons were in for education last week and aware of pt's care needs. Plan on going to OP therapies and referral made. Pt still reluctant to participate at times   Team Discussion: Patient is still very sad, down with minimal participation in therapy. MD addressed hyperglycemia. Progress limited by reluctance to participate in therapy sessions, poor initiation, poor attention, fear of falling and inability to weight shift and aphasia. Patient on target to meet rehab goals: LOS extended to work on consistency of function. Patient still needs min - mod assist for transfers and mod- max assist for toileting. Requires max assist for lower body care and mod - max assist for toileting.  Needs mod assist for communication; has a communication device at home but will need assistance to use it.  *See Care Plan and progress notes for long and short-term goals.   Revisions to Treatment Plan:  Downgraded OT goals to mod assist Downgraded PT goals Trial regular consistency diet in prep  for regular thin liquid diet for d/c Lexapro added per MD for depression Referral to OP coumadin clinic  Teaching Needs: Safety, transfers, toileting, medication management, secondary risk management, etc.  Current Barriers to Discharge: Decreased caregiver support and Incontinence  Possible Resolutions to Barriers: Family education completed with family OP  follow up services Request for PCS/aide MD schedules OP appointment for Botox injection      Medical Summary Current Status: INR now therapeutic on 2.5mg  coumadin per day, suboptimal potassium, hyperglycemia, depression, acute kidney injury, worsening spasticity  Barriers to Discharge: Medical stability  Barriers to Discharge Comments: INR now therapeutic on 2.5mg  coumadin per day, suboptimal potassium, hyperglycemia, depression, acute kidney injury, worsening spasticity Possible Resolutions to [employer]: continue coumading 2.5mg  daily with daily INR checks, need to set up with coumadin clinic outpatient, start on lexapro, start on baclofen, monitor labs   Continued Need for Acute Rehabilitation Level of Care: The patient requires daily medical management by a physician with specialized training in physical medicine and rehabilitation for the following reasons: Direction of a multidisciplinary physical rehabilitation program to maximize functional independence : Yes Medical management of patient stability for increased activity during participation in an intensive rehabilitation regime.: Yes Analysis of laboratory values and/or radiology reports with any subsequent need for medication adjustment and/or medical intervention. : Yes   I attest that I was present, lead the team conference, and concur with the assessment and plan of the team.   Dorien Chihuahua B 10/20/2021, 8:13 PM

## 2021-10-20 NOTE — Progress Notes (Signed)
ANTICOAGULATION CONSULT NOTE - Follow Up Consult  Pharmacy Consult for Coumadin Indication:  CVA + Acute renal artery thrombosis   No Known Allergies  Patient Measurements: Weight: 62.6 kg (138 lb 0.1 oz)  Vital Signs: Temp: 98.6 F (37 C) (01/18 0533) Temp Source: Oral (01/18 0533) BP: 102/66 (01/18 0533) Pulse Rate: 69 (01/18 0533)  Labs: Recent Labs    10/19/21 0537 10/20/21 0537  HGB  --  10.2*  HCT  --  30.0*  PLT  --  243  LABPROT 22.2* 23.1*  INR 2.0* 2.1*  CREATININE  --  0.98     Estimated Creatinine Clearance: 49.8 mL/min (by C-G formula based on SCr of 0.98 mg/dL).   Assessment: Anticoag: Recent CVA and h/o AFib > on apix PTA.  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 We will change INR to MWF  Goal of Therapy:  INR 2-3 Monitor platelets by anticoagulation protocol: Yes   Plan:  -Coumadin 2.5 mg po daily -INR MWF, CBC qWed  Onnie Boer, PharmD, [place], AAHIVP, CPP Infectious Disease Pharmacist 10/20/2021 9:18 AM

## 2021-10-21 ENCOUNTER — Ambulatory Visit: Payer: Medicare Other | Admitting: Urology

## 2021-10-21 ENCOUNTER — Other Ambulatory Visit (HOSPITAL_COMMUNITY): Payer: Self-pay

## 2021-10-21 LAB — PROTIME-INR
INR: 2.2 — ABNORMAL HIGH (ref 0.8–1.2)
Prothrombin Time: 24.7 seconds — ABNORMAL HIGH (ref 11.4–15.2)

## 2021-10-21 MED ORDER — SERTRALINE HCL 25 MG PO TABS
25.0000 mg | ORAL_TABLET | Freq: Every day | ORAL | 0 refills | Status: DC
  Filled 2021-10-21: qty 30, 30d supply, fill #0

## 2021-10-21 MED ORDER — B COMPLEX-C PO TABS: 1.0000 | ORAL_TABLET | Freq: Every day | ORAL | Status: DC

## 2021-10-21 MED ORDER — BACLOFEN 5 MG PO TABS
5.0000 mg | ORAL_TABLET | Freq: Every day | ORAL | 0 refills | Status: DC
  Filled 2021-10-21: qty 30, 30d supply, fill #0

## 2021-10-21 MED ORDER — METOPROLOL TARTRATE 50 MG PO TABS
50.0000 mg | ORAL_TABLET | Freq: Two times a day (BID) | ORAL | 0 refills | Status: DC
  Filled 2021-10-21: qty 60, 30d supply, fill #0

## 2021-10-21 MED ORDER — LEVETIRACETAM 500 MG PO TABS
500.0000 mg | ORAL_TABLET | Freq: Two times a day (BID) | ORAL | 0 refills | Status: DC
  Filled 2021-10-21: qty 60, 30d supply, fill #0

## 2021-10-21 MED ORDER — VITAMIN D (ERGOCALCIFEROL) 1.25 MG (50000 UNIT) PO CAPS: 50000.0000 [IU] | ORAL_CAPSULE | ORAL | Status: DC

## 2021-10-21 MED ORDER — WARFARIN SODIUM 2.5 MG PO TABS
2.5000 mg | ORAL_TABLET | Freq: Every day | ORAL | 0 refills | Status: DC
  Filled 2021-10-21: qty 30, 30d supply, fill #0

## 2021-10-21 MED ORDER — ACETAMINOPHEN 325 MG PO TABS: 325.0000 mg | ORAL_TABLET | ORAL | Status: AC | PRN

## 2021-10-21 NOTE — Progress Notes (Addendum)
Patient ID: Lindsey [name], female   DOB: [birth year], 66 y.o.   MRN: [id number] Daughter and pt aware of team conference yesterday and update. Preparing for discharge tomorrow. Working on [employer] referral so pt can get some home health aide services. OP referral made and will contact daughter to set up follow up appointments. Daughter wants to leave at latest at 10:30 due to has an appointment at [facility] at 12:30. Have let PA and MD know of this.

## 2021-10-21 NOTE — Progress Notes (Signed)
Inpatient Rehabilitation Care Coordinator Discharge Note   Patient Details  Name: Lindsey [name] MRN: [id number] Date of Birth: [birth year]   Discharge location: [place]  Length of Stay:  21 DAYS  Discharge activity level: MIN [place]  Home/community participation: ACITVE  Patient response [id number] Literacy - How often do you need to have someone help you when you read instructions, pamphlets, or other written material from your doctor or pharmacy?: Often  Patient response [id number] Isolation - How often do you feel lonely or isolated from those around you?: Patient unable to respond  Services provided included: MD, RD, PT, OT, SLP, RN, CM, TR, Pharmacy, Neuropsych, SW  Financial Services:  Financial Services Utilized: Medicare    Choices offered to/list presented to: pt and daughter  Follow-up services arranged:  Outpatient    Outpatient Servicies: [facility]- PT OT SP HAS ALL EQUIPMENT FROM PREVIOUS ADMISSION. REFERRAL MADE FOR PCS SERVICES.      Patient response to transportation need: Is the patient able to respond to transportation needs?: Yes In the past 12 months, has lack of transportation kept you from medical appointments or from getting medications?: No In the past 12 months, has lack of transportation kept you from meetings, work, or from getting things needed for daily living?: No    Comments (or additional information): PT MADE PROGRESS BUT AT [facility] PUSHED TO PARTICIPATE DUE TO DEPRESSION FROM HAVING ANOTHER CVA AND STARTING ALL OVER AGAIN WITH HER THERAPIES. DAUGHTER HERE DAILY ALONG WITH GRANDSON;S AND FEEL COMFORTABLE WITH CARE NEEDS.  Patient/Family verbalized understanding of follow-up arrangements:  Yes  Individual responsible for coordination of the follow-up plan: [facility] [phone]  Confirmed correct DME delivered: Lindsey [name] 10/21/2021    Lindsey [name], [place]

## 2021-10-21 NOTE — Progress Notes (Signed)
Speech Language Pathology Discharge Summary  Patient Details  Name: Lindsey [name] MRN: [id number] Date of Birth: [birth year]  Today's Date: 10/21/2021 SLP Missed Time: 68 Minutes Missed Time Reason: Patient unwilling to participate   Skilled Therapeutic Interventions:  Pt refusing to participate in skilled ST this date, shaking head "no" continually despite cues and encouragement to brief ST session.   Patient has met 5 of 5 long term goals.  Patient to discharge at overall Supervision;Min;Mod level.   Clinical Impression/Discharge Summary:   Pt has made functional progress during CIR stay and has met 5 out of 5 long term goals. Pt is discharging at overall mod A for expressive/receptive communication, min A for functional attention and Supervision A for swallow function. Pt's progress has been slow but consistent as a result of poor participation. Pt diet has been upgraded to regular/thin which is patient's baseline, however PO intake remains minimal. Pt is able to communicate basic wants/needs via multimodal communication methods with mod A cues for clarification. Pt is able to sustain attention during functional tasks when motivated. Pt appears to be at baseline function following previous CVA. Pt's daughter and grandsons have been present for tx sessions with education provided on communication recommendations, compensatory strategies and safe swallow strategies for regular/thin diet. All have verbalized understanding with recommendations which include 24/7 supervision at home (talks of bringing aide in) as well as ongoing ST services in outpatient setting.   Care Partner:  Caregiver Able to Provide Assistance: Yes  Type of Caregiver Assistance: Physical;Cognitive  Recommendation:  Outpatient SLP;24 hour supervision/assistance;Home Health SLP  Rationale for SLP Follow Up: Maximize functional communication;Reduce caregiver burden;Maximize swallowing safety;Maximize cognitive function and  independence   Reasons for discharge: Discharged from hospital;Treatment goals met   Patient/Family Agrees with Progress Made and Goals Achieved: Yes    Dewaine Conger 10/21/2021, 3:50 PM

## 2021-10-21 NOTE — Progress Notes (Addendum)
Inpatient Rehabilitation Discharge Medication Review by a Pharmacist  A complete drug regimen review was completed for this patient to identify any potential clinically significant medication issues.  High Risk Drug Classes Is patient taking? Indication by Medication  Antipsychotic No   Anticoagulant Yes Warfarin- A. 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, renal artery stenosis  Antibiotic No   Opioid No   Antiplatelet Yes Aspirin- CVA prophylaxis  Hypoglycemics/insulin No   Vasoactive Medication Yes Cardizem CD, Lopressor - rate control  Chemotherapy No   Other Yes Lipitor- HLD Keppra- Seizure Zoloft - depression Baclofen - spasm     Type of Medication Issue Identified Description of Issue Recommendation(s)  Drug Interaction(s) (clinically significant)     Duplicate Therapy     Allergy     No Medication Administration End Date     Incorrect Dose     Additional Drug Therapy Needed     Significant med changes from prior encounter (inform family/care partners about these prior to discharge).    Other       Clinically significant medication issues were identified that warrant physician communication and completion of prescribed/recommended actions by midnight of the next day:  NO   Time spent performing this drug regimen review (minutes):  [address] PharmD, [place], AAHIVP, CPP Infectious Disease Pharmacist 10/21/2021 9:22 AM

## 2021-10-21 NOTE — Progress Notes (Signed)
PROGRESS NOTE   Subjective/Complaints: No new complaints this morning Team reported depressed mood yesterday. Discussed Zoloft was started for depression as per daughter's request.   ROS: Unable to assess due to aphasia, +depressed mood as per team  Objective:   No results found. Recent Labs    10/20/21 0537  WBC 2.6*  HGB 10.2*  HCT 30.0*  PLT 243     Recent Labs    10/20/21 0537  NA 136  K 3.8  CL 104  CO2 22  GLUCOSE 103*  BUN 8  CREATININE 0.98  CALCIUM 8.7*       Intake/Output Summary (Last 24 hours) at 10/21/2021 1100 Last data filed at 10/20/2021 1735 Gross per 24 hour  Intake 160 ml  Output --  Net 160 ml        Physical Exam: Vital Signs Blood pressure 103/67, pulse 67, temperature 98.5 F (36.9 C), temperature source Oral, resp. rate 16, weight 62.6 kg, SpO2 100 %. Gen: no distress, normal appearing HEENT: oral mucosa pink and moist, NCAT Cardio: Reg rate Chest: normal effort, normal rate of breathing Abd: soft, non-distended Ext: no edema Psych: depressed mood Skin: intact  Neurological: often shaking head no.     Mental Status: She is alert.     Comments: Pt is alert. Follows simple basic commands. Nods head "yes/no" to simple questions. Exp>rec aphasia. Did not engage much however during exam due to mood. RUE tr/5. RLE 1-2/5 prox to 0-trace distally (poor effort). Moves left arm and leg freely. Decreased pain sense Right arm and leg. Does have resting flexor tone right wrist and elbow MAS 2/5 GU: improving continence      Assessment/Plan: 1. Functional deficits which require 3+ hours per day of interdisciplinary therapy in a comprehensive inpatient rehab setting. Physiatrist is providing close team supervision and 24 hour management of active medical problems listed below. Physiatrist and rehab team continue to assess barriers to discharge/monitor patient progress toward  functional and medical goals  Care Tool:  Bathing    Body parts bathed by patient: Right arm, Chest, Abdomen, Right upper leg, Left upper leg, Face   Body parts bathed by helper: Left arm, Buttocks, Front perineal area, Right lower leg, Left lower leg     Bathing assist Assist Level: Moderate Assistance - Patient 50 - 74%     Upper Body Dressing/Undressing Upper body dressing   What is the patient wearing?: Pull over shirt    Upper body assist Assist Level: Moderate Assistance - Patient 50 - 74%    Lower Body Dressing/Undressing Lower body dressing      What is the patient wearing?: Pants, Underwear/pull up     Lower body assist Assist for lower body dressing: Maximal Assistance - Patient 25 - 49%     Toileting Toileting    Toileting assist Assist for toileting: Maximal Assistance - Patient 25 - 49%     Transfers Chair/bed transfer  Transfers assist     Chair/bed transfer assist level: Moderate Assistance - Patient 50 - 74%     Locomotion Ambulation   Ambulation assist   Ambulation activity did not occur: Safety/medical concerns  Assist level: Moderate Assistance -  Patient 50 - 74% Assistive device: Cane-quad Max distance: 56ft   Walk 10 feet activity   Assist  Walk 10 feet activity did not occur: Safety/medical concerns  Assist level: Moderate Assistance - Patient - 50 - 74% Assistive device: Cane-quad   Walk 50 feet activity   Assist Walk 50 feet with 2 turns activity did not occur: Safety/medical concerns         Walk 150 feet activity   Assist Walk 150 feet activity did not occur: Safety/medical concerns         Walk 10 feet on uneven surface  activity   Assist Walk 10 feet on uneven surfaces activity did not occur: Safety/medical concerns         Wheelchair     Assist Is the patient using a wheelchair?: Yes Type of Wheelchair: Manual    Wheelchair assist level: Minimal Assistance - Patient > 75% Max  wheelchair distance: 62ft    Wheelchair 50 feet with 2 turns activity    Assist        Assist Level: Total Assistance - Patient < 25%   Wheelchair 150 feet activity     Assist      Assist Level: Total Assistance - Patient < 25%   Blood pressure 103/67, pulse 67, temperature 98.5 F (36.9 C), temperature source Oral, resp. rate 16, weight 62.6 kg, SpO2 100 %.  Medical Problem List and Plan: 1. 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 She'll need some motivation at times to participate  Start B/C vitamin complex  Continue CIR- PT, OT and SLP  I have rescheduled her outpatient Botox for February.   -Interdisciplinary Team Conference today   2.  Atrial fibrillation/acute renal artery thrombosis: -DVT/anticoagulation:  INR 1.8 1/15, dose 2.5mg  warfarin. Change INR checks to M/W/F Messaged Becky to set up appointment at Coumadin clinic Pending hypercoagulable labs, primary care provider could consider transitioning back to NOAC if the work-up is negative.  Placed outpatient hematology referral.              -antiplatelet therapy: continue Aspirin 81 mg daily 3. Pain Management: N/A 4. Situational depression: LCSW to evaluate and provide emotional support. Zoloft 25mg  ordered             -antipsychotic agents: N/A             -daughter supportive. Pt is unhappy about still being in the hospital. Will need encouragement and positive reinforcement from team.  5. Neuropsych: This patient is not capable of making decisions on her own behalf. 6. Skin/Wound Care: Routine skin care checks 7. Dysphagia: Dys 3 diet. Continue SLP 8.  9.  HTN: Vitals:   10/20/21 2013  10/21/21 0528  BP: 115/80 103/67  Pulse: 72 67  Resp: 17 16  Temp: 98.4 F (36.9 C) 98.5 F (36.9 C)  SpO2: 100% 100%    Supplement 8meq klor.  Continue lopressor to 50mg  daily, 62.5mg  at night to avoid bradycardia, seems to be most effective dosing- continue -will not resume magnesium home med as BP is stable -magnesium low but will not supplement given AKI 10: Klebsiella  positive, multifocal pyelonephritis with abscess: Infectious disease recommend transition from intravenous cefazolin to cefadroxil twice a day on discharge to complete a 4-week treatment course.  Stop date of the 10/20/2021. 11: Possible seizure activity:  -continue Keppra 12: Constipation: continue prn miralax 13: AKI: baseline Scr> 0.70. repeat 1/11 14.  Mild anemia: No indication for transfusion.  Monitor H&H.  1/2- Hb dropped slightly to 9.7 from 10.3 15. Suboptimal vitamin D: continue ergocalciferol 50,000U once per week for 7 weeks. 16. Urinary incontinence: improving, continue bladder program  17. Bradycardia: see #9 19. Elevated platelet count: resolved, monitor as needed.  20. Suboptimal potassium: 65meq klor ordered x1 1/18   LOS: 20 days A FACE TO [place] 10/21/2021, 11:00 AM

## 2021-10-21 NOTE — Progress Notes (Signed)
Occupational Therapy Discharge Summary  Patient Details  Name: Lindsey [name] MRN: [id number] Date of Birth: Jul 21, 1956  Today's Date: 10/21/2021 OT Missed Time: 73 Minutes Missed Time Reason: Patient unwilling/refused to participate without medical reason (Pt shaking head "no" despite encouragement and pt centered treatment options offered by therapist.)      Patient has met 12 of 13 long term goals due to improved activity tolerance, improved balance, postural control, ability to compensate for deficits, improved attention, improved awareness, and improved coordination.  Patient to discharge at overall Mod Assist level.  Patient's care partner requires assistance to provide the necessary physical and cognitive assistance at discharge.  Plan is to receive several hours of assistance per day through an aide service.  Reasons goals not met: Pt requires moderate assist for tub transfer secondary to continued difficulty with turn pivot motor planning and sequencing of steps; pt limited by apparent fear of falling as well. Fair to poor participation with low frustration tolerance during skilled OT also hindered progress.  Recommendation:  Patient will benefit from ongoing skilled OT services in outpatient setting to continue to advance functional skills in the area of BADL, functional transfer training, and RUE tone/PROM management to prevent joint contracture/skin breakdown.  Equipment: No equipment provided  Reasons for discharge: lack of progress toward goals and discharge from hospital  Patient/family agrees with progress made and goals achieved: Yes   OT Discharge Precautions/Restrictions  Precautions Precautions: Fall;Other (comment) Precaution Comments: Baseline R sided weakness, delayed initiation, poor participation in therapies Required Braces or Orthoses: Other Brace;Splint/Cast Splint/Cast: Baseline RUE resting hand splint and solid AFO Other Brace: Baseline R AFO for  ambulation Restrictions Weight Bearing Restrictions: No Pain Pain Assessment Pain Scale: Faces Faces Pain Scale: No hurt ADL ADL Eating: Supervision/safety Where Assessed-Eating: Wheelchair Grooming: Minimal assistance Where Assessed-Grooming: Sitting at sink Upper Body Bathing: Minimal assistance Where Assessed-Upper Body Bathing: Shower Lower Body Bathing: Minimal assistance Where Assessed-Lower Body Bathing: Shower Upper Body Dressing: Minimal assistance Where Assessed-Upper Body Dressing: Wheelchair Lower Body Dressing: Moderate cueing Where Assessed-Lower Body Dressing: Wheelchair Toileting: Moderate assistance Where Assessed-Toileting: Bedside Commode Toilet Transfer: Moderate assistance Toilet Transfer Method: Ambulating, Stand pivot Toilet Transfer Equipment: Bedside commode Tub/Shower Transfer: Not assessed Social research officer, government: Not assessed Vision Baseline Vision/History: 1 Wears glasses Patient Visual Report: No change from baseline Vision Assessment?: Yes Ocular Range of Motion: Impaired-to be further tested in functional context Alignment/Gaze Preference: Gaze left (mild) Tracking/Visual Pursuits: Requires cues, head turns, or add eye shifts to track Saccades: Decreased speed of saccadic movement;Additional head turns occurred during testing Perception  Inattention/Neglect: Does not attend to right side of body (improved since IE) Praxis Praxis: Impaired Praxis Impairment Details: Motor planning;Initiation Cognition Overall Cognitive Status: Difficult to assess (secondary to aphasia) Arousal/Alertness: Awake/alert Orientation Level: Other (comment) (difficult to assess d/t aphasia) Year: Other (Comment) (unable to assess d/t aphasia) Month:  (unable to assess d/t aphasia) Day of Week: Other (Comment) (unable to assess d/t aphasia) Attention: Focused;Sustained;Selective;Alternating Focused Attention: Appears intact Sustained Attention:  Impaired Sustained Attention Impairment: Verbal basic;Functional basic Selective Attention: Impaired Selective Attention Impairment: Verbal basic;Functional basic Alternating Attention: Impaired Alternating Attention Impairment: Verbal basic;Functional basic Memory: Impaired (unable to assess d/t aphasia) Immediate Memory Recall:  (unable to assess d/t aphasia) Memory Recall Sock:  (unable to assess d/t aphasia) Memory Recall Blue:  (unable to assess d/t aphasia) Memory Recall Bed:  (unable to assess d/t aphasia) Awareness: Impaired Awareness Impairment: Anticipatory impairment;Emergent impairment (difficult to assess d/t aphasia) Problem Solving: Impaired  Problem Solving Impairment: Functional basic;Verbal basic Behaviors: Poor frustration tolerance Safety/Judgment: Appears intact Sensation Sensation Light Touch: Impaired by gross assessment (unable to assess d/t aphasia) Light Touch Impaired Details: Impaired RLE Hot/Cold: Impaired by gross assessment Proprioception: Impaired by gross assessment Stereognosis: Not tested Additional Comments: difficulty fully assessing 2/2 global aphasia, dense R hemi RUE> RLE Coordination Gross Motor Movements are Fluid and Coordinated: No Fine Motor Movements are Fluid and Coordinated: No Coordination and Movement Description: R hemi UE>LE, decreased initiation, postural control and motor planning Finger Nose Finger Test: unable to complete on R Heel Shin Test: Unable on R Motor  Motor Motor: Hemiplegia;Abnormal postural alignment and control;Abnormal tone Motor - Discharge Observations: R hemi without significant change since date of eval (RUE > RLE weakness). Increase in RUE tone. Mobility  Bed Mobility Bed Mobility: Supine to Sit;Rolling Right;Rolling Left;Sit to Supine Rolling Right: Minimal Assistance - Patient > 75% Rolling Left: Minimal Assistance - Patient > 75% Supine to Sit: Minimal Assistance - Patient > 75% Sit to Supine:  Minimal Assistance - Patient > 75% Transfers Sit to Stand: Minimal Assistance - Patient > 75% Stand to Sit: Minimal Assistance - Patient > 75%  Trunk/Postural Assessment  Cervical Assessment Cervical Assessment: Exceptions to [facility] (forward head) Thoracic Assessment Thoracic Assessment: Exceptions to [facility] (rounded shoulders with right excess scapular elevation) Lumbar Assessment Lumbar Assessment: Exceptions to [facility] (posterior pelvic tilt) Postural Control Postural Control: Deficits on evaluation (posterior bias needing min assist to correct)  Balance Balance Balance Assessed: Yes Static Sitting Balance Static Sitting - Balance Support: Feet supported Static Sitting - Level of Assistance: 7: Independent Dynamic Sitting Balance Dynamic Sitting - Balance Support: Feet supported;During functional activity Dynamic Sitting - Level of Assistance: 4: Min assist Static Standing Balance Static Standing - Balance Support: Left upper extremity supported Static Standing - Level of Assistance: 4: Min assist Dynamic Standing Balance Dynamic Standing - Balance Support: Left upper extremity supported;During functional activity Dynamic Standing - Level of Assistance: 3: Mod assist;4: Min assist Extremity/Trunk Assessment RUE Assessment RUE Assessment: Exceptions to [facility] Passive Range of Motion (PROM) Comments: Limitations in shoulder flexion 0-110 degrees with increased tone.  Elbow flexion/extension, wrist extension and digit extension also limited secondary increased tone but able to achieve full. Active Range of Motion (AROM) Comments: No active movement noted General Strength Comments: increased tone in the pectoral, lats, elbow flexors, digit flexors RUE Body System: Neuro Brunstrum level for arm: Stage I Presynergy (Modified Ashworth: 2) Brunstrum level for hand: Stage I Flaccidity (Modified Ashworth: 2) LUE Assessment LUE Assessment: Within Functional Limits   Bonnie L Elsdon 10/21/2021,  1:12 PM

## 2021-10-21 NOTE — Progress Notes (Signed)
ANTICOAGULATION CONSULT NOTE - Follow Up Consult  Pharmacy Consult for Coumadin Indication:  CVA + Acute renal artery thrombosis   No Known Allergies  Patient Measurements: Weight: 62.6 kg (138 lb 0.1 oz)  Vital Signs: Temp: 98.5 F (36.9 C) (01/19 0528) Temp Source: Oral (01/19 0528) BP: 103/67 (01/19 0528) Pulse Rate: 67 (01/19 0528)  Labs: Recent Labs    10/19/21 0537 10/20/21 0537 10/21/21 0548  HGB  --  10.2*  --   HCT  --  30.0*  --   PLT  --  243  --   LABPROT 22.2* 23.1* 24.7*  INR 2.0* 2.1* 2.2*  CREATININE  --  0.98  --      Estimated Creatinine Clearance: 49.8 mL/min (by C-G formula based on SCr of 0.98 mg/dL).   Assessment: Anticoag: Recent CVA and h/o AFib > on apix PTA.  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 We will change INR to MWF  Goal of Therapy:  INR 2-3 Monitor platelets by anticoagulation protocol: Yes   Plan:  -Coumadin 2.5 mg po daily -INR MWF, CBC qWed  Ulyses Southward, PharmD, [place], AAHIVP, CPP Infectious Disease Pharmacist 10/21/2021 9:17 AM

## 2021-10-21 NOTE — Plan of Care (Signed)
Problem: RH Comprehension Communication Goal: LTG Patient will comprehend basic/complex auditory (SLP) Description: LTG: Patient will comprehend basic/complex auditory information with cues (SLP). Outcome: Completed/Met   Problem: RH Expression Communication Goal: LTG Patient will express needs/wants via multi-modal(SLP) Description: LTG:  Patient will express needs/wants via multi-modal communication (gestures/written, etc) with cues (SLP) Outcome: Completed/Met   Problem: RH Attention Goal: LTG Patient will demonstrate this level of attention during functional activites (SLP) Description: LTG:  Patient will will demonstrate this level of attention during functional activites (SLP) Outcome: Completed/Met   Problem: RH Swallowing Goal: LTG Patient will consume least restrictive diet using compensatory strategies with assistance (SLP) Description: LTG:  Patient will consume least restrictive diet using compensatory strategies with assistance (SLP) Outcome: Completed/Met   Problem: RH Expression Communication Goal: LTG Patient will verbally express basic/complex needs(SLP) Description: LTG:  Patient will verbally express basic/complex needs, wants or ideas with cues  (SLP) Outcome: Completed/Met

## 2021-10-21 NOTE — Progress Notes (Signed)
Physical Therapy Session Note  Patient Details  Name: Lindsey [name] MRN: [id number] Date of Birth: Sep 27, 1956  Today's Date: 10/21/2021 PT Individual Time: 1100-1155  PT Individual Time Calculation (min): 55 min    Short Term Goals: Week 3:  PT Short Term Goal 1 (Week 3): STG = LTG due to ELOS  Skilled Therapeutic Interventions/Progress Updates:     1st session: Pt sitting in w/c to start session. Shakes her head 'no' to PT tx but agreeable with coaxing. She denies pain. Completed sensory, motor, and visual assessment (see DC summary for details). Difficult to fully assess sensory and vision due to aphasia but she appears to have R sided sensory deficits (distal > proximal) as well as decreased oculomotor at end range in both eyes. Transported her to ortho rehab gym to practice car transfers with car height simulating [place]. Completed car transfer at [facility] level with assist needed for stand<>pivot as well as managing her weaker RLE into and out of the car. Pt with improvement in ability to initiate stepping during stand<>pivot transfers, as before she would be "frozen" with inability to initiate on R>L side. Pt then instructed in w/c propulsion where she required minA for propelling 29ft - distance limited by fatigue and minA needed for steering and powering - attempted to instruct her in hemi technique but due to motor apraxia, unable to isolate LUE and LLE movements. Pt then instructed in gait training with QC - ambulating 32ft + 27ft with minA with max encouragement for effort. Pt transported back to her room for time and daughter was at the bedside where she was updated on pt's progress, her mobility, and general DC planning. Daughter with questions regarding her AFO brace fitting - will provide her with [facility] contact for further needs. Pt in w/c with safety belt alarm on, all needs within reach.   2nd session: Pt supine in bed at start of session. Pt shaking head 'no' to PT tx.  Unable to convince or encourage her to participate. Missed 30 minutes of skilled therapy due to refusal.    Therapy Documentation Precautions:  Precautions Precautions: Fall, Other (comment) Precaution Comments: Baseline R-side hemiplegia; global aphasia Required Braces or Orthoses: Other Brace, Splint/Cast Splint/Cast: Baseline RUE resting hand splint Other Brace: Baseline R AFO for ambulation Restrictions Weight Bearing Restrictions: No General:    Therapy/Group: Individual Therapy  Mckinleigh Schuchart P Mirranda Monrroy PT 10/21/2021, 7:39 AM

## 2021-10-21 NOTE — Discharge Summary (Signed)
Physical Therapy Discharge Summary  Patient Details  Name: Lindsey [name] MRN: [id number] Date of Birth: 1956/05/04  Patient has met 7 of 8 long term goals due to improved activity tolerance, improved balance, improved postural control, ability to compensate for deficits, functional use of  right lower extremity, improved attention, and improved awareness.  Patient to discharge at a wheelchair level [place].   Patient's care partner is independent to provide the necessary physical and cognitive assistance at discharge. Extensive family education has been completed with both daughter and grandson during her rehab stay. Both family members have received hands on training and feel prepared for DC. Education completed on fall prevention, home safety, caregiver training, and to avoid ambulation at home until pt becomes more steady and indep which they voiced understanding.  Reasons goals not met: Stand<>pivot transfers fluctuated b/w min to modA depending on effort, fatigue, and sustained attention. Family education has been completed with both daughter and grandson and they have demonstrated safe and adequate ability to transfer her from bed<>chair.  Recommendation:  Patient will benefit from ongoing skilled PT services in outpatient setting to continue to advance safe functional mobility, address ongoing impairments in RLE weakness, functional transfers, w/c mobility, gait training, caregiver training, and minimize fall risk.  Equipment: No equipment provided. Pt owns all needed DME.  Reasons for discharge: treatment goals met and discharge from hospital  Patient/family agrees with progress made and goals achieved: Yes  PT Discharge Precautions/Restrictions Precautions Precautions: Fall;Other (comment) Precaution Comments: Baseline R sided weakness, delayed initiation, poor participation in therapies Required Braces or Orthoses: Other Brace;Splint/Cast Splint/Cast: Baseline RUE resting hand  splint and solid AFO Restrictions Weight Bearing Restrictions: No Pain Interference Pain Interference Pain Effect on Sleep: 8. Unable to answer Pain Interference with Therapy Activities: 8. Unable to answer Pain Interference with Day-to-Day Activities: 8. Unable to answer Vision/Perception  Vision - History Ability to See in Adequate Light: 0 Adequate  Cognition Overall Cognitive Status: Difficult to assess (d/t aphasia) Arousal/Alertness: Awake/alert Orientation Level: Disoriented to person;Oriented to situation;Oriented to place;Other (comment) (aphasia limiting) Attention: Focused;Sustained;Selective;Alternating Focused Attention: Appears intact Sustained Attention: Impaired Sustained Attention Impairment: Verbal basic;Functional basic Selective Attention: Impaired Selective Attention Impairment: Verbal basic;Functional basic Alternating Attention: Impaired Alternating Attention Impairment: Verbal basic;Functional basic Awareness: Impaired Awareness Impairment: Anticipatory impairment;Emergent impairment (difficult to asess due to aphasia) Problem Solving: Impaired Problem Solving Impairment: Functional basic;Verbal basic Behaviors: Poor frustration tolerance Safety/Judgment: Appears intact Sensation Sensation Light Touch: Impaired by gross assessment (difficult to assess due to aphasia) Light Touch Impaired Details: Impaired RLE Hot/Cold: Impaired by gross assessment Proprioception: Impaired by gross assessment Stereognosis: Impaired by gross assessment Additional Comments: difficulty fully assessing 2/2 global aphasia, dense R hemi RUE> RLE Coordination Gross Motor Movements are Fluid and Coordinated: No Coordination and Movement Description: R hemi UE>LE, decreased initiation, postural control and motor planning Heel Shin Test: Unable on R Motor  Motor Motor: Hemiplegia;Abnormal postural alignment and control;Abnormal tone Motor - Discharge Observations: R hemi  without significant change since date of eval (RUE > RLE weakness). Increase in RUE tone (see OT note for details)  Mobility Bed Mobility Bed Mobility: Supine to Sit;Rolling Right;Rolling Left;Sit to Supine Rolling Right: Minimal Assistance - Patient > 75% Rolling Left: Minimal Assistance - Patient > 75% Supine to Sit: Minimal Assistance - Patient > 75% Sit to Supine: Minimal Assistance - Patient > 75% Transfers Transfers: Sit to Stand;Stand to Sit;Stand Pivot Transfers Sit to Stand: Minimal Assistance - Patient > 75% Stand to Sit: Minimal  Assistance - Patient > 75% Stand Pivot Transfers: Minimal Assistance - Patient > 75%;Moderate Assistance - Patient 50 - 74% (can fluctuate depending on effort, fatigue, and attention) Stand Pivot Transfer Details: Tactile cues for weight shifting;Tactile cues for sequencing;Tactile cues for initiation;Tactile cues for posture;Verbal cues for sequencing;Verbal cues for technique;Verbal cues for precautions/safety;Verbal cues for gait pattern;Manual facilitation for weight shifting;Manual facilitation for placement Transfer (Assistive device): None Locomotion  Gait Ambulation: Yes Gait Assistance: Minimal Assistance - Patient > 75%;Moderate Assistance - Patient 50-74% (fluctuates) Gait Distance (Feet): 35 Feet Assistive device: Other (Comment) (L hand rail) Gait Assistance Details: Tactile cues for initiation;Tactile cues for sequencing;Tactile cues for weight shifting;Tactile cues for posture;Verbal cues for sequencing;Verbal cues for technique;Verbal cues for precautions/safety;Verbal cues for gait pattern;Verbal cues for safe use of DME/AE;Manual facilitation for weight shifting;Manual facilitation for placement Gait Assistance Details: Able to ambulate 26f with handrail on L and minA.  Requires modA for ambulating 5-12fwith QC. Gait Gait: Yes Gait Pattern: Impaired Gait Pattern: Step-to pattern;Decreased step length - left;Decreased step length -  right;Decreased stance time - right;Decreased hip/knee flexion - right;Decreased dorsiflexion - right;Decreased weight shift to right;Trendelenburg;Shuffle;Trunk flexed;Narrow base of support;Poor foot clearance - right;Poor foot clearance - left Gait velocity: decreased High Level Ambulation High Level Ambulation: Side stepping Side Stepping: modA while using hand rail Stairs / Additional Locomotion Stairs: No Wheelchair Mobility Wheelchair Mobility: Yes Wheelchair Propulsion: Left lower extremity Wheelchair Parts Management: Needs assistance Distance: [id number]/Postural Assessment  Cervical Assessment Cervical Assessment: Exceptions to [facility]FL[facility] Centerorward head) Thoracic Assessment Thoracic Assessment: Exceptions to WFL[facility] Sacramentoounded shoulders with R>L scapular elevation) Lumbar Assessment Lumbar Assessment: Exceptions to WFLDrexel Center For Digestive Healthosteroir pelvic tlit) Postural Control Postural Control: Deficits on evaluation (Moderate posterior bias in standing - limited by fear of falling)  Balance Balance Balance Assessed: Yes Static Sitting Balance Static Sitting - Balance Support: Feet supported Static Sitting - Level of Assistance: 7: Independent Dynamic Sitting Balance Dynamic Sitting - Balance Support: Feet supported;During functional activity Dynamic Sitting - Level of Assistance: 4: Min assInsurance risk surveyoranding - Balance Support: Left upper extremity supported Static Standing - Level of Assistance: 4: Min assist Dynamic Standing Balance Dynamic Standing - Balance Support: Left upper extremity supported;During functional activity Dynamic Standing - Level of Assistance: 3: Mod assist;4: Min assist Extremity Assessment  RLE Assessment RLE Assessment: Exceptions to WFLLucile Salter Packard Children'S Hosp. At Stanfordtive Range of Motion (AROM) Comments: DF ROM to limited at neutral General Strength Comments: Hip flex 2/5, hip abd 3/5, hip add 3/4, knee ext 3+/5, knee flex 2-/5 LLE Assessment LLE Assessment: Within  Functional Limits General Strength Comments: Grossly 4+ to 5/5 throughout    [place] 10/21/2021, 7:59 AM

## 2021-10-22 ENCOUNTER — Inpatient Hospital Stay: Payer: Medicare Other | Attending: Oncology | Admitting: Oncology

## 2021-10-22 ENCOUNTER — Other Ambulatory Visit: Payer: Self-pay

## 2021-10-22 ENCOUNTER — Encounter: Payer: Self-pay | Admitting: Oncology

## 2021-10-22 ENCOUNTER — Inpatient Hospital Stay: Payer: Medicare Other

## 2021-10-22 VITALS — BP 114/78 | HR 123 | Temp 97.9°F | Wt 131.0 lb

## 2021-10-22 DIAGNOSIS — D649 Anemia, unspecified: Secondary | ICD-10-CM

## 2021-10-22 DIAGNOSIS — N280 Ischemia and infarction of kidney: Secondary | ICD-10-CM | POA: Diagnosis not present

## 2021-10-22 DIAGNOSIS — I639 Cerebral infarction, unspecified: Secondary | ICD-10-CM

## 2021-10-22 DIAGNOSIS — R760 Raised antibody titer: Secondary | ICD-10-CM

## 2021-10-22 DIAGNOSIS — R7401 Elevation of levels of liver transaminase levels: Secondary | ICD-10-CM

## 2021-10-22 DIAGNOSIS — I6389 Other cerebral infarction: Secondary | ICD-10-CM

## 2021-10-22 DIAGNOSIS — Z86718 Personal history of other venous thrombosis and embolism: Secondary | ICD-10-CM

## 2021-10-22 DIAGNOSIS — D6861 Antiphospholipid syndrome: Secondary | ICD-10-CM

## 2021-10-22 DIAGNOSIS — I4891 Unspecified atrial fibrillation: Secondary | ICD-10-CM

## 2021-10-22 DIAGNOSIS — Z7901 Long term (current) use of anticoagulants: Secondary | ICD-10-CM

## 2021-10-22 LAB — CBC WITH DIFFERENTIAL/PLATELET
Abs Immature Granulocytes: 0.01 10*3/uL (ref 0.00–0.07)
Basophils Absolute: 0 10*3/uL (ref 0.0–0.1)
Basophils Relative: 1 %
Eosinophils Absolute: 0.2 10*3/uL (ref 0.0–0.5)
Eosinophils Relative: 6 %
HCT: 30.2 % — ABNORMAL LOW (ref 36.0–46.0)
Hemoglobin: 10 g/dL — ABNORMAL LOW (ref 12.0–15.0)
Immature Granulocytes: 0 %
Lymphocytes Relative: 39 %
Lymphs Abs: 1.5 10*3/uL (ref 0.7–4.0)
MCH: 28.2 pg (ref 26.0–34.0)
MCHC: 33.1 g/dL (ref 30.0–36.0)
MCV: 85.3 fL (ref 80.0–100.0)
Monocytes Absolute: 0.3 10*3/uL (ref 0.1–1.0)
Monocytes Relative: 9 %
Neutro Abs: 1.7 10*3/uL (ref 1.7–7.7)
Neutrophils Relative %: 45 %
Platelets: 298 10*3/uL (ref 150–400)
RBC: 3.54 MIL/uL — ABNORMAL LOW (ref 3.87–5.11)
RDW: 15.8 % — ABNORMAL HIGH (ref 11.5–15.5)
Smear Review: NORMAL
WBC: 3.9 10*3/uL — ABNORMAL LOW (ref 4.0–10.5)
nRBC: 0 % (ref 0.0–0.2)

## 2021-10-22 LAB — RETIC PANEL
Immature Retic Fract: 11.8 % (ref 2.3–15.9)
RBC.: 3.52 MIL/uL — ABNORMAL LOW (ref 3.87–5.11)
Retic Count, Absolute: 48.2 10*3/uL (ref 19.0–186.0)
Retic Ct Pct: 1.4 % (ref 0.4–3.1)
Reticulocyte Hemoglobin: 30.3 pg (ref >27.9)

## 2021-10-22 LAB — TECHNOLOGIST SMEAR REVIEW

## 2021-10-22 LAB — PROTIME-INR
INR: 2.5 — ABNORMAL HIGH (ref 0.8–1.2)
Prothrombin Time: 27.3 seconds — ABNORMAL HIGH (ref 11.4–15.2)

## 2021-10-22 LAB — IRON AND TIBC
Iron: 43 ug/dL (ref 28–170)
Saturation Ratios: 19 % (ref 10.4–31.8)
TIBC: 230 ug/dL — ABNORMAL LOW (ref 250–450)
UIBC: 187 ug/dL

## 2021-10-22 LAB — FERRITIN: Ferritin: 1206 ng/mL — ABNORMAL HIGH (ref 11–307)

## 2021-10-22 NOTE — Progress Notes (Signed)
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  Patient initially presented to emergency room for evaluation of nausea, vomiting, diaphoresis, change of mental status.  Her initial evaluation included CT scan of abdomen pelvis which revealed multifocal pyelonephritis of the right kidney.  Patient was treated with intravenous antibiotics.  At baseline, patient has no history of prior left MCA stroke and right hemiplegia and aphasia.  Patient also has history of atrial fibrillation along has been on Eliquis 5 mg twice daily. 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  There was also concern about seizure activities due to decreased responsiveness and neurology started patient on Keppra.  Patient later was transitioned to Levaquin and discharged to inpatient rehab on 09/24/2021.  There he developed leukocytosis, tachycardia and a fever again.  09/26/2021 repeat CT scan of the abdomen pelvis showed interim development of left renal artery thrombus.  Patient was transferred back to acute care internal medicine.  Antibodies for antiphospholipid syndrome were collected while patient was on heparin. 09/28/2021  lupus anticoagulant positive Anticardiolipin IgG, IgM negative, IgA 20 Beta-2 glycoprotein  IgG, IgM, IgA negative Patient was transitioned to Coumadin, currently on 2.5 mg daily.  Patient currently is in rehab and is going to be discharged. Patient was referred to establish care with hematology.   Review of Systems  Unable to perform ROS: Other (Aphasia)  Constitutional:  Negative for appetite change.  Respiratory:  Negative for shortness of breath.   Cardiovascular:  Negative for chest pain.  Gastrointestinal:  Negative for abdominal pain.  Musculoskeletal:  Negative for arthralgias.  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 No pertinent surgical history.  SOCIAL HISTORY: Social History   Socioeconomic History   Marital status: Single    Spouse name: Not on file   Number of children: Not on file   Years of education: Not on file   Highest education level: Not on file  Occupational History   Not on file  Tobacco Use   Smoking status: Never    Passive exposure: Never   Smokeless tobacco: Never  Vaping Use   Vaping Use: Never used  Substance and Sexual Activity   Alcohol use: Not Currently    Comment: states once a wine/beer occassionally    Drug use: No   Sexual activity: Not  Currently  Other Topics Concern   Not on file  Social History Narrative   Not on file   Social Determinants of Health   Financial Resource Strain: Not on file  Food Insecurity: Not on file  Transportation Needs: Not on file  Physical Activity: Not on file  Stress: Not on file  Social Connections: Not on file  Intimate Partner Violence: Not on file     FAMILY HISTORY: Family History  Problem Relation Age of Onset   Breast cancer Neg Hx     ALLERGIES:  has No Known Allergies.  MEDICATIONS:  Current Outpatient Medications  Medication Sig Dispense Refill   acetaminophen (TYLENOL) 325 MG tablet Take 1-2 tablets (325-650 mg total) by mouth every 4 (four) hours as needed for mild pain.     aspirin EC 81 MG EC tablet Take 1 tablet (81 mg total) by mouth daily. Swallow whole. 30 tablet 0   atorvastatin (LIPITOR) 20 MG tablet Take 1 tablet (20 mg total) by mouth daily at 6 PM. 90 tablet 3   B Complex-C (B-COMPLEX WITH VITAMIN C) tablet Take 1 tablet by mouth daily.     Baclofen 5 MG TABS Take 1 tablet (5 mg) by mouth at bedtime. 30 tablet 0   diltiazem (CARDIZEM CD) 180 MG 24 hr capsule Take 1 capsule (180 mg total) by mouth daily. 30 capsule 0   levETIRAcetam (KEPPRA) 500 MG tablet Take 1 tablet (500 mg total) by mouth every 12 (twelve) hours. 60 tablet 0   metoprolol tartrate (LOPRESSOR) 50 MG tablet Take 1 tablet (50 mg total) by mouth 2 (two) times daily. 60 tablet 0   metoprolol tartrate (LOPRESSOR) 50 MG tablet Take 1 tablet (50 mg total) by mouth 2 (two) times daily. 60 tablet 0   Multiple Vitamins-Minerals (MULTIVITAMIN ADULTS 50+) TABS Take 1 tablet by mouth daily.     sertraline (ZOLOFT) 25 MG tablet Take 1 tablet (25 mg total) by mouth at bedtime. 30 tablet 0   [START ON 10/26/2021] Vitamin D, Ergocalciferol, (DRISDOL) 1.25 MG (50000 UNIT) CAPS capsule Take 1 capsule (50,000 Units total) by mouth every 7 (seven) days. Each Tuesday 5 capsule    warfarin (COUMADIN) 2.5 MG tablet Take 1 tablet (2.5 mg total) by mouth daily at 4 PM. 30 tablet 0   No current facility-administered medications for this visit.     PHYSICAL EXAMINATION: ECOG PERFORMANCE STATUS: 3 - Symptomatic, >50% confined to bed Vitals:   10/22/21 1218  BP: 114/78  Pulse: (!) 123  Temp: 97.9 F (36.6 C)   Filed Weights   10/22/21 1218  Weight: 131 lb (59.4  kg)    Physical Exam Constitutional:      General: She is not in acute distress.    Comments: Patient sits in the wheelchair  HENT:     Head: Normocephalic and atraumatic.  Eyes:     General: No scleral icterus. Cardiovascular:     Rate and Rhythm: Normal rate.     Heart sounds: Normal heart sounds.  Pulmonary:     Effort: Pulmonary effort is normal. No respiratory distress.     Breath sounds: No wheezing.  Abdominal:     General: Bowel sounds are normal. There is no distension.     Palpations: Abdomen is soft.  Musculoskeletal:        General: Normal range of motion.     Cervical back: Normal range of motion and neck supple.     Right lower leg: No edema.  Skin:    General: Skin is warm and dry.     Findings: No erythema or rash.  Neurological:     Mental Status: She is alert. Mental status is at baseline.     Comments: Aphasia  Right hemiplegia    LABORATORY DATA:  I have reviewed the data as listed Lab Results  Component Value Date   WBC 3.9 (L) 10/22/2021   HGB 10.0 (L) 10/22/2021   HCT 30.2 (L) 10/22/2021   MCV 85.3 10/22/2021   PLT 298 10/22/2021   Recent Labs    09/27/21 0837 09/28/21 0325 09/29/21 0155 10/01/21 0045 10/02/21 0518 10/06/21 0042 10/13/21 0532 10/20/21 0537  NA 136   < > 131*   < > 133* 134* 137 136  K 3.8   < > 4.6   < > 3.7 4.0 3.8 3.8  CL 102   < > 98   < > 99 102 104 104  CO2 23   < > 23   < > 22 21* 21* 22  GLUCOSE 101*   < > 134*   < > 107* 96 96 103*  BUN 17   < > 13   < > 11 9 11 8   CREATININE 1.74*   < > 1.32*   < > 1.29* 1.08* 1.16* 0.98  CALCIUM 8.9   < > 8.6*   < > 8.7* 8.7* 9.2 8.7*  GFRNONAA 32*   < > 45*   < > 46* 57* 52* >60  PROT 7.7  --  7.0  --  7.6  --   --   --   ALBUMIN 2.4*  --  2.2*  --  2.3*  --   --   --   AST 71*  --  69*  --  69*  --   --   --   ALT 32  --  24  --  20  --   --   --   ALKPHOS 75  --  93  --  84  --   --   --   BILITOT 1.1  --  1.0  --  0.7  --   --   --    < > = values in this  interval not displayed.   Iron/TIBC/Ferritin/ %Sat    Component Value Date/Time   IRON 43 10/22/2021 1316   TIBC 230 (L) 10/22/2021 1316   FERRITIN 1,206 (H) 10/22/2021 1316   IRONPCTSAT 19 10/22/2021 1316      RADIOGRAPHIC STUDIES: I have personally reviewed the radiological images as listed and agreed with the findings in the report. DG Abd 1 View  Result Date: 09/24/2021 CLINICAL DATA:  Abdominal pain, vomiting EXAM: ABDOMEN - 1 VIEW COMPARISON:  None. FINDINGS: The bowel gas pattern is normal. No radio-opaque calculi or other significant radiographic abnormality are seen. IMPRESSION: Nonobstructive pattern of bowel gas. No obvious free air in the abdomen on single supine radiograph. Electronically Signed   By: Delanna Ahmadi M.D.   On: 09/24/2021 14:20   CT HEAD WO CONTRAST (5MM)  Result Date: 09/23/2021 CLINICAL DATA:  Stroke, possible hemorrhagic conversion. EXAM: CT HEAD WITHOUT CONTRAST TECHNIQUE: Contiguous axial images were obtained from the base of the skull through the vertex without intravenous contrast. COMPARISON:  09/22/2021. FINDINGS: Brain: No acute intracranial hemorrhage, midline shift or mass effect. No extra-axial fluid collection. There is redemonstration of hypodense attenuation and encephalomalacia region in the MCA territory on the left compatible with old infarct. A  hypodense region is noted in the left cerebellar hemisphere, not significantly changed from the prior exam. Extensive subcortical and periventricular white matter hypodensities are noted bilaterally. There is no hydrocephalus. Vascular: Atherosclerotic calcification of the carotid siphons. No hyperdense vessel. Skull: Normal. Negative for fracture or focal lesion. Sinuses/Orbits: There is partial opacification of the ethmoid air cells and maxillary sinuses bilaterally. The orbits are stable. Other: None. IMPRESSION: 1. Hypodense region in the left cerebellar hemisphere, compatible with known acute infarct.  No acute hemorrhage is identified. 2. Stable old infarct in the MCA territory on the left. 3. Atrophy with chronic microvascular ischemic changes. Electronically Signed   By: Brett Fairy M.D.   On: 09/23/2021 02:06   CT ABDOMEN PELVIS W CONTRAST  Result Date: 09/26/2021 CLINICAL DATA:  Nausea vomiting pyelonephritis EXAM: CT ABDOMEN AND PELVIS WITH CONTRAST TECHNIQUE: Multidetector CT imaging of the abdomen and pelvis was performed using the standard protocol following bolus administration of intravenous contrast. CONTRAST:  17m OMNIPAQUE IOHEXOL 300 MG/ML  SOLN COMPARISON:  MRI 09/19/2021, CT 09/19/2021 FINDINGS: Lower chest: Lung bases demonstrate patchy atelectasis at the bases. No pleural effusion. Cardiomegaly with biatrial enlargement. Hepatobiliary: Multiple gallbladder folds. No calcified stones. No focal hepatic abnormality. No biliary dilatation. Pancreas: Unremarkable. No pancreatic ductal dilatation or surrounding inflammatory changes. Spleen: Normal in size without focal abnormality. Adrenals/Urinary Tract: Adrenal glands are within normal limits. Multifocal hypoenhancement within the right kidney, greatest involving the upper and midpole, consistent with pyelonephritis. More focal hypodense collection at the upper pole measuring 3.1 by 2.7 cm, suspect for phlegmon or renal abscess, this is mildly decreased from the prior CT at which time the seen area measures 3.4 x 3.3 cm. There are several cysts in the left kidney. Interim development of markedly abnormal appearance of left kidney with multifocal hypoenhancement involving large portion of the upper and midpole of left kidney with relative sparing of the lower pole. Some peripheral cortical enhancement is preserved at the midpole of the left kidney. Left perinephric fat stranding. No hydronephrosis or hydroureter. The bladder is unremarkable Stomach/Bowel: The stomach is nonenlarged. No dilated small bowel. No acute bowel wall thickening.  Negative appendix. Vascular/Lymphatic: Nonaneurysmal aorta with mild atherosclerosis. New thrombus is present within the left main renal artery, series 3 image 32, and coronal series 6 image 36 through 40 on coronal views this is approximately 10 mm distal to the renal artery origin, thrombus extends to upper pole segmental vessels of the kidney. Renal veins appear patent. No suspicious adenopathy. Reproductive: Calcified uterine fibroid.  No adnexal mass. Other: Negative for pelvic effusion or free air. Musculoskeletal: No acute or significant osseous findings. IMPRESSION: 1. Interim development of markedly abnormal appearance of the left kidney with extensive areas of hypoenhancement, relative sparing of the lower pole. New finding of thrombus within the left renal artery. Suspect that the hypoenhancement in the left kidney is largely due to renal infarct given the presence of thrombus in the artery, though there may be associated areas of pyelonephritis as well. 2. Residual patchy hypoenhancement at the right kidney, consistent with pyelonephritis. More focal hypodense collection at the upper pole right kidney is slightly smaller compared to the prior CT and may reflect phlegmon or resolving abscess (favored), with infarct also in the differential. 3. Calcified uterine fibroid Critical Value/emergent results were called by telephone at the time of interpretation on 09/26/2021 at 6:46 pm to provider Dr. NCicero Duck Who verbally acknowledged these results. Electronically Signed   By: KMadie RenoD.  On: 09/26/2021 18:48   DG CHEST PORT 1 VIEW  Result Date: 09/25/2021 CLINICAL DATA:  Shortness of breath. EXAM: PORTABLE CHEST 1 VIEW COMPARISON:  Chest radiograph dated 09/19/2021. FINDINGS: Left lung base reticular densities, likely atelectasis. Developing infiltrate is less likely. No focal consolidation, pleural effusion, pneumothorax. Mild cardiomegaly. Atherosclerotic calcification of the aorta. No  acute osseous pathology. IMPRESSION: Left lung base atelectasis. Developing infiltrate is less likely. Electronically Signed   By: Anner Crete M.D.   On: 09/25/2021 20:22   EEG adult  Result Date: 09/23/2021 Lora Havens, MD     09/23/2021 11:44 AM Patient Name: Jasenia Weilbacher MRN: [id number] Epilepsy Attending: Lora Havens Referring Physician/Provider: Dr Donnetta Simpers Date: 09/23/2021 Duration: 23.57 mins Patient history: 66 year old female with seizure-like episode.  EEG to evaluate for seizure. Level of alertness: Awake, asleep AEDs during EEG study: LEV Technical aspects: This EEG study was done with scalp electrodes positioned according to the 10-20 International system of electrode placement. Electrical activity was acquired at a sampling rate of 500Hz  and reviewed with a high frequency filter of 70Hz  and a low frequency filter of 1Hz . EEG data were recorded continuously and digitally stored. Description: The posterior dominant rhythm consists of 8 Hz activity of moderate voltage (25-35 uV) seen predominantly in posterior head regions, asymmetric ( left<right) and reactive to eye opening and eye closing. Sleep was characterized by vertex waves, sleep spindles (12 to 14 Hz), maximal frontocentral region.  EEG showed continuous 3 to 5 Hz theta Parenta slowing in left hemisphere.  Hyperventilation and photic stimulation were not performed.   ABNORMALITY -Continued slow, left hemisphere IMPRESSION: This study is suggestive of cortical dysfunction in left hemisphere, nonspecific etiology but could be secondary to underlying structural abnormality, postictal state.  No seizures or epileptiform discharges were seen throughout the recording. If suspicion for ictal-interictal activity persists, please consider prolonged EEG. Lora Havens   ECHOCARDIOGRAM COMPLETE  Result Date: 09/23/2021    ECHOCARDIOGRAM REPORT   Patient Name:   [name] Date of Exam: 09/23/2021 Medical Rec #:   [id number]     Height:       64.0 in Accession #:    [id number]    Weight:       165.0 lb Date of Birth:  28-Oct-1955     BSA:          1.803 m Patient Age:    66 years      BP:           143/71 mmHg Patient Gender: F             HR:           86 bpm. Exam Location:  Inpatient Procedure: 2D Echo, Cardiac Doppler and Color Doppler Indications:    Stroke  History:        Patient has prior history of Echocardiogram examinations, most                 recent 04/22/2020. Stroke, Arrythmias:Atrial Fibrillation; Risk                 Factors:Hypertension.  Sonographer:    Glo Herring Referring Phys: [id number] [place]  1. Left ventricular ejection fraction, by estimation, is 50 to 55%. The left ventricle has low normal function. The left ventricle has no regional wall motion abnormalities. Left ventricular diastolic function could not be evaluated.  2. Right ventricular systolic function is normal. The right ventricular size is normal. There is  normal pulmonary artery systolic pressure.  3. Left atrial size was mild to moderately dilated.  4. The mitral valve is normal in structure. Trivial mitral valve regurgitation. No evidence of mitral stenosis.  5. Tricuspid valve regurgitation is mild to moderate.  6. The aortic valve is tricuspid. Aortic valve regurgitation is not visualized. No aortic stenosis is present.  7. The inferior vena cava is normal in size with <50% respiratory variability, suggesting right atrial pressure of 8 mmHg. Comparison(s): Changes from prior study are noted. Low normal EF on current study, in afib. FINDINGS  Left Ventricle: Left ventricular ejection fraction, by estimation, is 50 to 55%. The left ventricle has low normal function. The left ventricle has no regional wall motion abnormalities. The left ventricular internal cavity size was normal in size. There is no left ventricular hypertrophy. Left ventricular diastolic function could not be evaluated due to atrial fibrillation.  Left ventricular diastolic function could not be evaluated. Right Ventricle: The right ventricular size is normal. Right vetricular wall thickness was not well visualized. Right ventricular systolic function is normal. There is normal pulmonary artery systolic pressure. The tricuspid regurgitant velocity is 2.58 m/s, and with an assumed right atrial pressure of 8 mmHg, the estimated right ventricular systolic pressure is 83.6 mmHg. Left Atrium: Left atrial size was mild to moderately dilated. Right Atrium: Right atrial size was not well visualized. Pericardium: There is no evidence of pericardial effusion. Mitral Valve: The mitral valve is normal in structure. Mild mitral annular calcification. Trivial mitral valve regurgitation. No evidence of mitral valve stenosis. Tricuspid Valve: The tricuspid valve is normal in structure. Tricuspid valve regurgitation is mild to moderate. No evidence of tricuspid stenosis. Aortic Valve: The aortic valve is tricuspid. Aortic valve regurgitation is not visualized. No aortic stenosis is present. Aortic valve mean gradient measures 2.0 mmHg. Aortic valve peak gradient measures 3.3 mmHg. Aortic valve area, by VTI measures 2.08 cm. Pulmonic Valve: The pulmonic valve was grossly normal. Pulmonic valve regurgitation is not visualized. No evidence of pulmonic stenosis. Aorta: The aortic root, ascending aorta, aortic arch and descending aorta are all structurally normal, with no evidence of dilitation or obstruction. Venous: The inferior vena cava is normal in size with less than 50% respiratory variability, suggesting right atrial pressure of 8 mmHg. IAS/Shunts: The interatrial septum was not well visualized.  LEFT VENTRICLE PLAX 2D LVIDd:         3.80 cm   Diastology LVIDs:         3.00 cm   LV e' medial:    6.54 cm/s LV PW:         1.00 cm   LV E/e' medial:  16.8 LV IVS:        1.00 cm   LV e' lateral:   8.71 cm/s LVOT diam:     1.90 cm   LV E/e' lateral: 12.6 LV SV:         38 LV SV  Index:   21 LVOT Area:     2.84 cm  RIGHT VENTRICLE            IVC RV S prime:     7.62 cm/s  IVC diam: 2.00 cm LEFT ATRIUM             Index LA diam:        4.10 cm 2.27 cm/m LA Vol (A2C):   41.9 ml 23.24 ml/m LA Vol (A4C):   45.3 ml 25.13 ml/m LA Biplane Vol: 44.1 ml 24.46  ml/m  AORTIC VALVE AV Area (Vmax):    2.13 cm AV Area (Vmean):   2.03 cm AV Area (VTI):     2.08 cm AV Vmax:           90.40 cm/s AV Vmean:          67.100 cm/s AV VTI:            0.184 m AV Peak Grad:      3.3 mmHg AV Mean Grad:      2.0 mmHg LVOT Vmax:         67.90 cm/s LVOT Vmean:        48.000 cm/s LVOT VTI:          0.135 m LVOT/AV VTI ratio: 0.73  AORTA Ao Root diam: 3.10 cm Ao Asc diam:  3.00 cm MITRAL VALVE                TRICUSPID VALVE MV Area (PHT): 4.03 cm     TR Peak grad:   26.6 mmHg MV Decel Time: 188 msec     TR Vmax:        258.00 cm/s MV E velocity: 109.67 cm/s                             SHUNTS                             Systemic VTI:  0.14 m                             Systemic Diam: 1.90 cm Buford Dresser MD Electronically signed by Buford Dresser MD Signature Date/Time: 09/23/2021/2:57:32 PM    Final    ECHOCARDIOGRAM LIMITED BUBBLE STUDY  Result Date: 09/27/2021    ECHOCARDIOGRAM LIMITED REPORT   Patient Name:   [name] Date of Exam: 09/27/2021 Medical Rec #:  [id number]     Height:       64.2 in Accession #:    [id number]    Weight:       146.2 lb Date of Birth:  30-Aug-1956     BSA:          1.715 m Patient Age:    66 years      BP:           130/82 mmHg Patient Gender: F             HR:           95 bpm. Exam Location:  Inpatient Procedure: Limited Echo, Color Doppler, Cardiac Doppler and Saline Contrast            Bubble Study Indications:    Renal artery thrombosis  History:        Patient has prior history of Echocardiogram examinations, most                 recent 09/23/2021. Stroke; Risk Factors:Hypertension.  Sonographer:    Glo Herring Referring Phys: [place]  1. Left ventricular ejection fraction, by estimation, is 60 to 65%. The left ventricle has normal function. The left ventricle has no regional wall motion abnormalities. Left ventricular diastolic function could not be evaluated.  2. Left atrial size was moderately dilated.  3. Right atrial size was mildly dilated.  4. Tricuspid valve regurgitation is moderate to severe.  5. The aortic valve is tricuspid. Aortic valve regurgitation is not visualized. No aortic stenosis is present.  6. There is moderately elevated pulmonary artery systolic pressure.  7. The inferior vena cava is normal in size with <50% respiratory variability, suggesting right atrial pressure of 8 mmHg.  8. Agitated saline contrast bubble study was negative, with no evidence of any interatrial shunt. FINDINGS  Left Ventricle: Left ventricular ejection fraction, by estimation, is 60 to 65%. The left ventricle has normal function. The left ventricle has no regional wall motion abnormalities. The left ventricular internal cavity size was normal in size. There is  no left ventricular hypertrophy. Left ventricular diastolic function could not be evaluated. Left ventricular diastolic function could not be evaluated due to atrial fibrillation. Right Ventricle: There is moderately elevated pulmonary artery systolic pressure. The tricuspid regurgitant velocity is 3.12 m/s, and with an assumed right atrial pressure of 8 mmHg, the estimated right ventricular systolic pressure is 14.4 mmHg. Left Atrium: Left atrial size was moderately dilated. Right Atrium: Right atrial size was mildly dilated. Pericardium: There is no evidence of pericardial effusion. Mitral Valve: Mild mitral annular calcification. Tricuspid Valve: Tricuspid valve regurgitation is moderate to severe. Aortic Valve: The aortic valve is tricuspid. Aortic valve regurgitation is not visualized. No aortic stenosis is present. Venous: The inferior vena cava is normal in size with less  than 50% respiratory variability, suggesting right atrial pressure of 8 mmHg. IAS/Shunts: No atrial level shunt detected by color flow Doppler. Agitated saline contrast was given intravenously to evaluate for intracardiac shunting. Agitated saline contrast bubble study was negative, with no evidence of any interatrial shunt. LEFT VENTRICLE PLAX 2D LVIDd:         4.50 cm LVIDs:         2.90 cm LV PW:         0.90 cm LV IVS:        0.90 cm  IVC IVC diam: 1.80 cm LEFT ATRIUM         Index LA diam:    4.80 cm 2.80 cm/m  TRICUSPID VALVE TR Peak grad:   38.9 mmHg TR Vmax:        312.00 cm/s Dani Gobble Croitoru MD Electronically signed by Sanda Klein MD Signature Date/Time: 09/27/2021/2:20:30 PM    Final    VAS [place] LOWER EXTREMITY VENOUS (DVT)  Result Date: 10/01/2021  Lower Venous DVT Study Patient Name:  MARKIAH JANEWAY  Date of Exam:   10/01/2021 Medical Rec #: [id number]      Accession #:    [id number] Date of Birth: [birth year]      Patient Gender: F Patient Age:   51 years Exam Location:  [facility] Procedure:      VAS [place] LOWER EXTREMITY VENOUS (DVT) Referring Phys: EMILY MULLEN --------------------------------------------------------------------------------  Indications: Left leg pain.  Limitations: Suboptimal patient positioning. Comparison       04-22-2020 Bilateral lower extremity venous was negative for Study:           DVT. Performing Technologist: Darlin Coco RDMS, RVT  Examination Guidelines: A complete evaluation includes B-mode imaging, spectral Doppler, color Doppler, and power Doppler as needed of all accessible portions of each vessel. Bilateral testing is considered an integral part of a complete examination. Limited examinations for reoccurring indications may be performed as noted. The reflux portion of the exam is performed with the patient in reverse Trendelenburg.  +---------+---------------+---------+-----------+----------+--------------+  LEFT       Compressibility Phasicity Spontaneity Properties Thrombus Aging  +---------+---------------+---------+-----------+----------+--------------+  CFV       Full            Yes       Yes                                    +---------+---------------+---------+-----------+----------+--------------+  SFJ       Full                                                             +---------+---------------+---------+-----------+----------+--------------+  FV Prox   Full                                                             +---------+---------------+---------+-----------+----------+--------------+  FV Mid    Full                                                             +---------+---------------+---------+-----------+----------+--------------+  FV Distal Full                                                             +---------+---------------+---------+-----------+----------+--------------+  PFV       Full                                                             +---------+---------------+---------+-----------+----------+--------------+  POP       Full            Yes       Yes                                    +---------+---------------+---------+-----------+----------+--------------+  PTV       Full                                                             +---------+---------------+---------+-----------+----------+--------------+  PERO      Full                                                             +---------+---------------+---------+-----------+----------+--------------+  Summary: LEFT: - There is no evidence of deep vein thrombosis in the lower extremity.  - No cystic structure found in the popliteal fossa.  *See table(s) above for measurements and observations. Electronically signed by Monica Martinez MD on 10/01/2021 at 4:30:00 PM.    Final       ASSESSMENT & PLAN:  1. Lupus anticoagulant positive   2. Normocytic anemia   3. 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HCC)   4. Elevated AST (SGOT)   5.  Cerebrovascular accident (CVA) due to other mechanism ([place])    #Lupus anticoagulant positive, renal artery thrombosis and recurrent stroke Antiphospholipid syndrome I agree with long term anticoagulation with coumadin with goal of INR between 2-3. Recommend patient to follow up with PCP for INR checks and coumadin dosing.  Repeat lupus anticoagulant antibody in 3 months.  Check other hypercoagulable work up: Factor V, prothrombin gene mutation.  Check ANAA  # Normocytic anemia Check cbc,retic panel, myeloma panel, light chain ratio, iron TIBC ferritin,   Orders Placed This Encounter  Procedures   CBC with Differential/Platelet    Standing Status:   Future    Number of Occurrences:   1    Standing Expiration Date:   10/22/2022   Retic Panel    Standing Status:   Future    Number of Occurrences:   1    Standing Expiration Date:   10/22/2022   Technologist smear review    Standing Status:   Future    Number of Occurrences:   1    Standing Expiration Date:   10/22/2022   ANA, IFA (with reflex)    Standing Status:   Future    Number of Occurrences:   1    Standing Expiration Date:   10/22/2022   Ferritin    Standing Status:   Future    Number of Occurrences:   1    Standing Expiration Date:   04/21/2022   Iron and TIBC([facility])    Standing Status:   Future    Number of Occurrences:   1    Standing Expiration Date:   10/22/2022   Factor 5 leiden    Standing Status:   Future    Number of Occurrences:   1    Standing Expiration Date:   10/22/2022   Prothrombin gene mutation    Standing Status:   Future    Number of Occurrences:   1    Standing Expiration Date:   10/22/2022   Hepatitis panel, acute    Standing Status:   Future    Number of Occurrences:   1    Standing Expiration Date:   10/22/2022   Multiple Myeloma Panel (SPEP&IFE w/QIG)    Standing Status:   Future    Number of Occurrences:   1    Standing Expiration Date:   10/22/2022   Kappa/lambda light chains     Standing Status:   Future    Number of Occurrences:   1    Standing Expiration Date:   10/22/2022    All questions were answered. The patient knows to call the clinic with any problems questions or concerns.  cc Barbie Banner, PA-C    Return of visit: 3 months.  Thank you for this kind referral and the opportunity to participate in the care of this patient. A copy of today's note is routed to referring provider   Earlie Server, MD, PhD [facility] Hematology Oncology 10/22/2021

## 2021-10-22 NOTE — Progress Notes (Signed)
ANTICOAGULATION CONSULT NOTE - Follow Up Consult  Pharmacy Consult for Coumadin Indication:  CVA + Acute renal artery thrombosis   No Known Allergies  Patient Measurements: Weight: 62.6 kg (138 lb 0.1 oz)  Vital Signs: Temp: 97.4 F (36.3 C) (01/20 0518) Temp Source: Oral (01/20 0518) BP: 100/73 (01/20 0518) Pulse Rate: 90 (01/20 0518)  Labs: Recent Labs    10/20/21 0537 10/21/21 0548 10/22/21 0526  HGB 10.2*  --   --   HCT 30.0*  --   --   PLT 243  --   --   LABPROT 23.1* 24.7* 27.3*  INR 2.1* 2.2* 2.5*  CREATININE 0.98  --   --      Estimated Creatinine Clearance: 49.8 mL/min (by C-G formula based on SCr of 0.98 mg/dL).   Assessment: Anticoag: Recent CVA and h/o AFib > on apix PTA.  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 Plan for discharge today. Dose seems to be stable.   Goal of Therapy:  INR 2-3 Monitor platelets by anticoagulation protocol: Yes   Plan:  -Coumadin 2.5 mg po daily -INR check next week  Onnie Boer, PharmD, BCIDP, AAHIVP, CPP Infectious Disease Pharmacist 10/22/2021 9:30 AM

## 2021-10-22 NOTE — Progress Notes (Signed)
PROGRESS NOTE   Subjective/Complaints: No new complaints this morning In good mood regarding discharge! Daughter plans to take patient to other appointment today so planning for early discharge  ROS: Unable to assess due to aphasia, +depressed mood as per team  Objective:   No results found. Recent Labs    10/20/21 0537  WBC 2.6*  HGB 10.2*  HCT 30.0*  PLT 243     Recent Labs    10/20/21 0537  NA 136  K 3.8  CL 104  CO2 22  GLUCOSE 103*  BUN 8  CREATININE 0.98  CALCIUM 8.7*       Intake/Output Summary (Last 24 hours) at 10/22/2021 0857 Last data filed at 10/22/2021 0700 Gross per 24 hour  Intake 360 ml  Output --  Net 360 ml        Physical Exam: Vital Signs Blood pressure 100/73, pulse 90, temperature (!) 97.4 F (36.3 C), temperature source Oral, resp. rate 14, weight 62.6 kg, SpO2 100 %. Gen: no distress, normal appearing HEENT: oral mucosa pink and moist, NCAT Cardio: Reg rate Chest: normal effort, normal rate of breathing Abd: soft, non-distended Ext: no edema Psych: pleasant, normal affect Skin: intact  Neurological: often shaking head no.     Mental Status: She is alert.     Comments: Pt is alert. Follows simple basic commands. Nods head "yes/no" to simple questions. Exp>rec aphasia. Did not engage much however during exam due to mood. RUE tr/5. RLE 1-2/5 prox to 0-trace distally (poor effort). Moves left arm and leg freely. Decreased pain sense Right arm and leg. Does have resting flexor tone right wrist and elbow MAS 2/5 GU: improving continence      Assessment/Plan: 1. Functional deficits which require 3+ hours per day of interdisciplinary therapy in a comprehensive inpatient rehab setting. Physiatrist is providing close team supervision and 24 hour management of active medical problems listed below. Physiatrist and rehab team continue to assess barriers to discharge/monitor  patient progress toward functional and medical goals  Care Tool:  Bathing    Body parts bathed by patient: Right arm, Chest, Abdomen, Right upper leg, Left upper leg, Face   Body parts bathed by helper: Left arm, Buttocks, Front perineal area, Right lower leg, Left lower leg     Bathing assist Assist Level: Minimal Assistance - Patient > 75%     Upper Body Dressing/Undressing Upper body dressing   What is the patient wearing?: Pull over shirt    Upper body assist Assist Level: Minimal Assistance - Patient > 75%    Lower Body Dressing/Undressing Lower body dressing      What is the patient wearing?: Pants, Underwear/pull up     Lower body assist Assist for lower body dressing: Moderate Assistance - Patient 50 - 74%     Toileting Toileting    Toileting assist Assist for toileting: Moderate Assistance - Patient 50 - 74%     Transfers Chair/bed transfer  Transfers assist     Chair/bed transfer assist level: Minimal Assistance - Patient > 75%     Locomotion Ambulation   Ambulation assist   Ambulation activity did not occur: Safety/medical concerns  Assist level:  Minimal Assistance - Patient > 75% Assistive device: Cane-quad Max distance: 66ft   Walk 10 feet activity   Assist  Walk 10 feet activity did not occur: Safety/medical concerns  Assist level: Minimal Assistance - Patient > 75% Assistive device: Cane-quad   Walk 50 feet activity   Assist Walk 50 feet with 2 turns activity did not occur: Safety/medical concerns         Walk 150 feet activity   Assist Walk 150 feet activity did not occur: Safety/medical concerns         Walk 10 feet on uneven surface  activity   Assist Walk 10 feet on uneven surfaces activity did not occur: Safety/medical concerns         Wheelchair     Assist Is the patient using a wheelchair?: Yes Type of Wheelchair: Manual    Wheelchair assist level: Minimal Assistance - Patient > 75% Max  wheelchair distance: 55ft    Wheelchair 50 feet with 2 turns activity    Assist        Assist Level: Minimal Assistance - Patient > 75%   Wheelchair 150 feet activity     Assist      Assist Level: Maximal Assistance - Patient 25 - 49%   Blood pressure 100/73, pulse 90, temperature (!) 97.4 F (36.3 C), temperature source Oral, resp. rate 14, weight 62.6 kg, SpO2 100 %.  Medical Problem List and Plan: 1. 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 She'll need some motivation at times to participate  Start B/C vitamin complex  Continue CIR- PT, OT and SLP  I have rescheduled her outpatient Botox for February.   -Interdisciplinary Team Conference today   2.  Atrial fibrillation/acute renal artery thrombosis: -DVT/anticoagulation:  INR 1.8 1/15, dose 2.5mg  warfarin. Change INR checks to M/W/F, continue this outpatient.  Messaged Becky to set up appointment at Coumadin clinic Pending hypercoagulable labs, primary care provider could consider transitioning back to NOAC if the work-up is negative.  Placed outpatient hematology referral.              -antiplatelet therapy: continue Aspirin 81 mg daily 3. Pain Management: N/A 4. Situational depression: LCSW to evaluate and provide emotional support. Zoloft 25mg  ordered, discussed that it will take at least two weeks to show effects.              -antipsychotic agents: N/A             -daughter supportive. Pt is unhappy about still being in the hospital. Will need encouragement and positive reinforcement from team.  5. Neuropsych: This patient is not capable of making decisions on her own behalf. 6. Skin/Wound Care:  Routine skin care checks 7. Dysphagia: Dys 3 diet. Continue SLP 8.  9.  HTN: Vitals:   10/21/21 1941 10/22/21 0518  BP: 126/77 100/73  Pulse: 66 90  Resp: 18 14  Temp: 98.4 F (36.9 C) (!) 97.4 F (36.3 C)  SpO2: 100% 100%    Supplement 76meq klor. Continue to monitor potassium outpatient Continue lopressor to 50mg  daily, 62.5mg  at night to avoid bradycardia, seems to be  most effective dosing- continue -will not resume magnesium home med as BP is stable -magnesium low but will not supplement given AKI 10: Klebsiella positive, multifocal pyelonephritis with abscess: Infectious disease recommend transition from intravenous cefazolin to cefadroxil twice a day on discharge to complete a 4-week treatment course.  Stop date of the 10/20/2021. 11: Possible seizure activity:  -continue Keppra 12: Constipation: continue prn miralax 13: AKI: baseline Scr> 0.70. repeat 1/11 14.  Mild anemia: No indication for transfusion.  Monitor H&H.  1/2- Hb dropped slightly to 9.7 from 10.3 15. Suboptimal vitamin D: continue ergocalciferol 50,000U once per week for 7 weeks. 16. Urinary incontinence: improving, continue bladder program  17. Bradycardia: see #9 19. Elevated platelet count: resolved, monitor as needed.  20. Suboptimal potassium: 82meq klor ordered x1 1/18   LOS: 21 days A FACE TO FACE EVALUATION WAS PERFORMED  Clide Deutscher Deangleo Passage 10/22/2021, 8:57 AM

## 2021-10-22 NOTE — Progress Notes (Signed)
INPATIENT REHABILITATION DISCHARGE NOTE   Discharge instructions by: Dois Davenport PA  Verbalized understanding: by family   Skin care/Wound care healing? No wounds  Pain: no pain  IV's: removed  Tubes/Drains: none  O2:room air   Safety instructions: given to family   Patient belongings: sent with family   Discharged to: home  Discharged via: Wheelchair   Notes:

## 2021-10-23 LAB — HEPATITIS PANEL, ACUTE
HCV Ab: NONREACTIVE
Hep A IgM: NONREACTIVE
Hep B C IgM: NONREACTIVE
Hepatitis B Surface Ag: NONREACTIVE

## 2021-10-25 ENCOUNTER — Other Ambulatory Visit (HOSPITAL_COMMUNITY): Payer: Self-pay

## 2021-10-25 ENCOUNTER — Telehealth: Payer: Self-pay | Admitting: *Deleted

## 2021-10-25 ENCOUNTER — Ambulatory Visit: Payer: Medicare Other | Admitting: Occupational Therapy

## 2021-10-25 ENCOUNTER — Ambulatory Visit: Payer: Medicare Other | Admitting: Speech Pathology

## 2021-10-25 LAB — KAPPA/LAMBDA LIGHT CHAINS
Kappa free light chain: 112.8 mg/L — ABNORMAL HIGH (ref 3.3–19.4)
Kappa, lambda light chain ratio: 1.96 — ABNORMAL HIGH (ref 0.26–1.65)
Lambda free light chains: 57.6 mg/L — ABNORMAL HIGH (ref 5.7–26.3)

## 2021-10-25 MED ORDER — VITAMIN D (ERGOCALCIFEROL) 1.25 MG (50000 UNIT) PO CAPS: 50000.0000 [IU] | ORAL_CAPSULE | ORAL | 0 refills | Status: DC

## 2021-10-25 NOTE — Telephone Encounter (Signed)
Transitional Care call--Daughter [name]    Are you/is patient experiencing any problems since coming home? Are there any questions regarding any aspect of care? NO Are there any questions regarding medications administration/dosing? Are meds being taken as prescribed? Patient should review meds with caller to confirm Patient does not have the vit d ordered at discharge. Per rx it says OTC but that dose (50,000) is not OTC. Sent in to the pharmacy. Have there been any falls? NO Has Home Health been to the house and/or have they contacted you? If not, have you tried to contact them? Can we help you contact them?OUTPT REHAB and appts already set up Are bowels and bladder emptying properly? Are there any unexpected incontinence issues? If applicable, is patient following bowel/bladder programs? NO Any fevers, problems with breathing, unexpected pain? NO Are there any skin problems or new areas of breakdown? NO Has the patient/family member arranged specialty MD follow up (ie cardiology/neurology/renal/surgical/etc)?  Can we help arrange?NO Does the patient need any other services or support that we can help arrange? NO Are caregivers following through as expected in assisting the patient?YES Has the patient quit smoking, drinking alcohol, or using drugs as recommended? N/A  Appointment time, 11/15/21 @10 :40 arrive by 10:20 to see Dr , will be receiving botox at this appt. [address]

## 2021-10-26 ENCOUNTER — Telehealth: Payer: Self-pay

## 2021-10-26 LAB — MULTIPLE MYELOMA PANEL, SERUM
Albumin SerPl Elph-Mcnc: 3.2 g/dL (ref 2.9–4.4)
Albumin/Glob SerPl: 0.7 (ref 0.7–1.7)
Alpha 1: 0.4 g/dL (ref 0.0–0.4)
Alpha2 Glob SerPl Elph-Mcnc: 1 g/dL (ref 0.4–1.0)
B-Globulin SerPl Elph-Mcnc: 2.1 g/dL — ABNORMAL HIGH (ref 0.7–1.3)
Gamma Glob SerPl Elph-Mcnc: 1.5 g/dL (ref 0.4–1.8)
Globulin, Total: 5 g/dL — ABNORMAL HIGH (ref 2.2–3.9)
IgA: 1244 mg/dL — ABNORMAL HIGH (ref 87–352)
IgG (Immunoglobin G), Serum: 1549 mg/dL (ref 586–1602)
IgM (Immunoglobulin M), Srm: 107 mg/dL (ref 26–217)
Total Protein ELP: 8.2 g/dL (ref 6.0–8.5)

## 2021-10-26 LAB — ANTINUCLEAR ANTIBODIES, IFA: ANA Ab, IFA: NEGATIVE

## 2021-10-26 NOTE — Telephone Encounter (Signed)
°  Please advise or prescribe:  Rx Diltiazem 180 mg 24hr caps are not at the pharmacy.  The script was prescribed by Harvie Heck while she was in the hospital. Should she continue the Rx at home?  Will you send in a refill?    Call back phone [phone]. Family has also been advised to call PCP for advice.

## 2021-10-27 ENCOUNTER — Other Ambulatory Visit: Payer: Self-pay

## 2021-10-27 ENCOUNTER — Ambulatory Visit (INDEPENDENT_AMBULATORY_CARE_PROVIDER_SITE_OTHER): Payer: Medicare Other | Admitting: Internal Medicine

## 2021-10-27 ENCOUNTER — Other Ambulatory Visit: Payer: Self-pay | Admitting: Physical Medicine and Rehabilitation

## 2021-10-27 ENCOUNTER — Encounter: Payer: Self-pay | Admitting: Internal Medicine

## 2021-10-27 VITALS — BP 95/61 | HR 100 | Temp 97.9°F

## 2021-10-27 DIAGNOSIS — N12 Tubulo-interstitial nephritis, not specified as acute or chronic: Secondary | ICD-10-CM

## 2021-10-27 DIAGNOSIS — N151 Renal and perinephric abscess: Secondary | ICD-10-CM | POA: Diagnosis not present

## 2021-10-27 DIAGNOSIS — I6389 Other cerebral infarction: Secondary | ICD-10-CM

## 2021-10-27 DIAGNOSIS — A498 Other bacterial infections of unspecified site: Secondary | ICD-10-CM | POA: Diagnosis not present

## 2021-10-27 LAB — FACTOR 5 LEIDEN

## 2021-10-27 LAB — PROTHROMBIN GENE MUTATION

## 2021-10-27 MED ORDER — DILTIAZEM HCL ER COATED BEADS 180 MG PO CP24: 180.0000 mg | ORAL_CAPSULE | Freq: Every day | ORAL | 0 refills | Status: DC

## 2021-10-27 NOTE — Patient Instructions (Signed)
Thank you for coming to see me today. It was a pleasure seeing you.  To Do: Labs today Follow up based on results Contact urology office to confirm appointment for tomorrow  [facility] Thursday Oct 28, 2021 Follow-up with Dr. Cain Sieve on January 26th at 11:30 am [place] [id number] [phone]  If you have any questions or concerns, please do not hesitate to call the office at [phone].  Take Care,   Jule Ser

## 2021-10-27 NOTE — Progress Notes (Signed)
[place] for Infectious Disease  CHIEF COMPLAINT:    Follow up for Klebsiella pneumonia pyelonephritis and renal abscess  Chief Complaint  Patient presents with   Hospitalization Follow-up    Klebsiella pneumonia pyelonephritis and renal abscess     SUBJECTIVE:    Lindsey [name] is a 66 y.o. female with PMHx as below who presents to the clinic for Klebsiella pneumonia pyelonephritis and renal abscess.   Patient was admitted recently at [facility] for this issue.  During the early course of her admission she was followed by infectious disease and transitioned to levofloxacin by Dr. Gale Journey on 09/22/2021.  The plan was to continue antibiotics for approximately 4 weeks starting 09/22/2021.  Unfortunately, she was readmitted after being discharged to rehab and was found to have new renal infarct.  She was continued on cefazolin while hospitalized and ultimately was discharged to rehab on 10/01/2021 where she was continued on cefadroxil 1 g twice daily for 4 weeks.  The end date for this antibiotic was 10/20/2021.  Patient remained inpatient at [facility] until discharge on 10/22/2021.  At time of discharge her WBC was 3.9.  She has had no further fevers or chills since stopping antibiotics approximately 1 week ago.  Her last imaging was on 12/25 which showed a right-sided small resolving abscess measuring approximately 3.1 x 2.7 cm.  Her daughter is here with her and reports no fevers, chills.  No abdominal pain or flank pain.  No concerns for recurrent infection.  She says her mom does not have much appetite right now.     Please see A&P for the details of today's visit and status of the patient's medical problems.   Patient's Medications  New Prescriptions   No medications on file  Previous Medications   ACETAMINOPHEN (TYLENOL) 325 MG TABLET    Take 1-2 tablets (325-650 mg total) by mouth every 4 (four) hours as needed for mild pain.   ATORVASTATIN (LIPITOR) 20 MG TABLET    Take 1  tablet (20 mg total) by mouth daily at 6 PM.   B COMPLEX-C (B-COMPLEX WITH VITAMIN C) TABLET    Take 1 tablet by mouth daily.   BACLOFEN 5 MG TABS    Take 1 tablet (5 mg) by mouth at bedtime.   DILTIAZEM (CARDIZEM CD) 180 MG 24 HR CAPSULE    Take 1 capsule (180 mg total) by mouth daily.   LEVETIRACETAM (KEPPRA) 500 MG TABLET    Take 1 tablet (500 mg total) by mouth every 12 (twelve) hours.   METOPROLOL TARTRATE (LOPRESSOR) 50 MG TABLET    Take 1 tablet (50 mg total) by mouth 2 (two) times daily.   METOPROLOL TARTRATE (LOPRESSOR) 50 MG TABLET    Take 1 tablet (50 mg total) by mouth 2 (two) times daily.   MULTIPLE VITAMINS-MINERALS (MULTIVITAMIN ADULTS 50+) TABS    Take 1 tablet by mouth daily.   SERTRALINE (ZOLOFT) 25 MG TABLET    Take 1 tablet (25 mg total) by mouth at bedtime.   VITAMIN D, ERGOCALCIFEROL, (DRISDOL) 1.25 MG (50000 UNIT) CAPS CAPSULE    Take 1 capsule (50,000 Units total) by mouth every 7 (seven) days. Each Tuesday   WARFARIN (COUMADIN) 2.5 MG TABLET    Take 1 tablet (2.5 mg total) by mouth daily at 4 PM.  Modified Medications   No medications on file  Discontinued Medications   No medications on file      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 (HCC)    CVA (cerebral vascular accident) ([place])    Diverticulosis    History of ischemic left MCA stroke    Hypertension    Pain due to onychomycosis of toenails of both feet    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HCC)    Uterine prolapse     Social History   Tobacco Use   Smoking status: Never    Passive exposure: Never   Smokeless tobacco: Never  Vaping Use   Vaping Use: Never used  Substance Use Topics   Alcohol use: Not Currently    Comment: states once a wine/beer occassionally    Drug use: No    Family History  Problem Relation Age of Onset   Breast cancer Neg Hx     No Known Allergies  Review of Systems  Unable to  perform ROS: Mental acuity  Constitutional:        Her daughter provides collateral information and confirms questions.     OBJECTIVE:    Vitals:   10/27/21 1428  Pulse: 100  Temp: 97.9 F (36.6 C)  TempSrc: Temporal  SpO2: 92%   There is no height or weight on file to calculate BMI.  Physical Exam Constitutional:      General: She is not in acute distress.    Appearance: Normal appearance.  HENT:     Head: Normocephalic and atraumatic.  Eyes:     Extraocular Movements: Extraocular movements intact.     Conjunctiva/sclera: Conjunctivae normal.  Pulmonary:     Effort: Pulmonary effort is normal. No respiratory distress.  Abdominal:     General: There is no distension.     Palpations: Abdomen is soft.     Tenderness: There is no abdominal tenderness.  Musculoskeletal:        General: Normal range of motion.     Cervical back: Normal range of motion and neck supple.  Skin:    General: Skin is warm and dry.     Findings: No rash.  Neurological:     General: No focal deficit present.     Mental Status: She is alert. Mental status is at baseline.  Psychiatric:        Mood and Affect: Mood normal.        Behavior: Behavior normal.     Labs and Microbiology: CBC Latest Ref Rng & Units 10/22/2021 10/20/2021 10/13/2021  WBC 4.0 - 10.5 K/uL 3.9(L) 2.6(L) 3.9(L)  Hemoglobin 12.0 - 15.0 g/dL 10.0(L) 10.2(L) 9.5(L)  Hematocrit 36.0 - 46.0 % 30.2(L) 30.0(L) 29.3(L)  Platelets 150 - 400 K/uL 298 243 500(H)   CMP Latest Ref Rng & Units 10/20/2021 10/13/2021 10/06/2021  Glucose 70 - 99 mg/dL 103(H) 96 96  BUN 8 - 23 mg/dL 8 11 9   Creatinine 0.44 - 1.00 mg/dL 0.98 1.16(H) 1.08(H)  Sodium 135 - 145 mmol/L 136 137 134(L)  Potassium 3.5 - 5.1 mmol/L 3.8 3.8 4.0  Chloride 98 - 111 mmol/L 104 104 102  CO2 22 - 32 mmol/L 22 21(L) 21(L)  Calcium 8.9 - 10.3 mg/dL 8.7(L) 9.2 8.7(L)  Total Protein 6.5 - 8.1 g/dL - - -  Total Bilirubin 0.3 - 1.2 mg/dL - - -  Alkaline Phos 38 - 126 U/L - -  -  AST 15 - 41 U/L - - -  ALT 0 - 44 U/L - - -  ASSESSMENT & PLAN:    1. Pyelonephritis  2. Renal abscess, right  3. Klebsiella pneumoniae infection  Patient has been treated with a little over 4 weeks of effective antibiotics for pyelonephritis and small renal abscess due to Klebsiella pneumoniae that was resolving based on prior imaging 09/26/2021.  She has been without recurrent infectious symptoms since stopping antibiotics 1 week ago.  Her most recent WBC was 3.9.  We will continue to monitor her off antibiotics.  Given her clinical improvement at this time we will not pursue further imaging.  Will check ESR, CRP today.  If normal this will be further reassuring.  If abnormal, will plan for renal ultrasound for follow-up.  She also has urology follow up tomorrow.    Raynelle Highland for Infectious Disease [facility] 10/27/2021, 2:33 PM

## 2021-10-28 ENCOUNTER — Encounter: Payer: Self-pay | Admitting: Occupational Therapy

## 2021-10-28 ENCOUNTER — Ambulatory Visit: Payer: Medicare Other | Attending: Physical Medicine and Rehabilitation | Admitting: Speech Pathology

## 2021-10-28 ENCOUNTER — Ambulatory Visit: Payer: Medicare Other | Admitting: Occupational Therapy

## 2021-10-28 DIAGNOSIS — R482 Apraxia: Secondary | ICD-10-CM | POA: Diagnosis present

## 2021-10-28 DIAGNOSIS — R4701 Aphasia: Secondary | ICD-10-CM | POA: Insufficient documentation

## 2021-10-28 DIAGNOSIS — I63542 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: Secondary | ICD-10-CM | POA: Diagnosis present

## 2021-10-28 DIAGNOSIS — M6281 Muscle weakness (generalized): Secondary | ICD-10-CM | POA: Diagnosis present

## 2021-10-28 LAB — SEDIMENTATION RATE: Sed Rate: 65 mm/h — ABNORMAL HIGH (ref 0–30)

## 2021-10-28 LAB — C-REACTIVE PROTEIN: CRP: 7.7 mg/L (ref <8.0)

## 2021-10-28 NOTE — Therapy (Signed)
[place] MAIN [facility] SERVICES [address] [place], [place], [address] Phone: [phone]   Fax:  [phone]  Speech Language Pathology Evaluation  Patient Details  Name: Lindsey [name] MRN: [id number] Date of Birth: Feb 01, 1956 Referring Provider (SLP): Dr. 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  There were no vitals filed for this visit.   Subjective Assessment - 10/28/21 1538     Subjective "Lindsey [name]"    Patient is accompained by: Family member   daughter [name]   Currently in Pain? No/denies                SLP Evaluation [facility] - 10/28/21 1538       SLP Visit Information   SLP Received On 10/28/21    Referring Provider (SLP) Dr. Ranell Patrick    Onset Date 09/19/21    Medical Diagnosis CVA      General Information   HPI Patient is a 66 y.o. female with prior history of CVA in 2021 with residual aphasia (using Lingraphica AAC device to assist with communication as of 08/2021) referred by Dr. Ranell Patrick after recent hospital admission when she was found to have new L AICA infarct complicated by pyelonephritis. Admitted to [facility] and then CIR 12/18-12/30/22. MRI 09/22/21 showed acute moderate size L AICA cerebellar infarct, underlying  extensive chronic left MCA encephalomalacia related to 2021 infarct, left brainstem Wallerian degeneration and chronic left PICA cerebellar infarcts.    Behavioral/Cognition alert, pleasant, averts eye contact at times    Mobility Status uses a wheelchair      Prior Functional Status   Cognitive/Linguistic Baseline Baseline deficits    Baseline deficit details aphasia, apraxia    Type of Home House     Lives With Daughter    Available Support Family      Cognition   Overall Cognitive Status Difficult to assess    Difficult to assess due to Impaired communication    Attention Sustained    Sustained Attention --   adequate for language testing     Auditory Comprehension   Overall Auditory Comprehension Impaired    Yes/No Questions Impaired    Basic Biographical Questions 51-75% accurate    Basic Immediate Environment Questions 50-74% accurate    Complex Questions 25-49% accurate    Commands Impaired    One Step Basic Commands 0-24% accurate   severe apraxia noted   Interfering Components Motor planning;Processing speed    EffectiveTechniques Repetition;Extra processing time;Visual/Gestural cues;Stressing words      Visual Recognition/Discrimination   Discrimination Exceptions to  WFL    Pictures Able in field of 3   75%     Reading Comprehension   Reading Status Impaired    Word level Other (comment)   word level written cues helpful for object ID ~70% of the time     Expression   Primary Mode of Expression Verbal      Verbal Expression   Overall Verbal Expression Impaired    Initiation Impaired    Automatic Speech Social Response;Name;Counting;Day of week   Counting 1-10, days of the week with cues for initiation, written cues   Level of Generative/Spontaneous Verbalization Word    Repetition Impaired    Level of Impairment Word level    Naming --   0/6     Written Expression   Dominant Hand Right    Written Expression Not tested      Oral Motor/Sensory Function    Overall Oral Motor/Sensory Function --   Limited due to significant apraxia     Motor Speech   Overall Motor Speech Impaired    Respiration Within functional limits    Phonation Low vocal intensity;Hoarse;Other (comment)   gravelly, low pitch   Resonance Within functional limits    Articulation Impaired    Intelligibility Intelligibility reduced    Motor Planning Impaired    Level of Impairment Word    Motor Speech Errors Groping for words;Unaware;Consistent;Aware    Effective Techniques --   Visual/articulatory cues helpful for imitation     Standardized Assessments   Standardized Assessments  Other Assessment   Quick Aphasia Battery   Other Assessment QAB: severe 1.64                             SLP Education - 10/28/21 1554     Education Details proposed plan of care/therapy goals    Person(s) Educated Patient;Child(ren)    Methods Explanation    Comprehension Verbalized understanding              SLP Short Term Goals - 10/28/21 1739       SLP SHORT TERM GOAL #1   Title Pt will answer personally relevant Y/N questions in context using multimodal communication or AAC if necessary, 75% accuracy.    Time 10    Period --   sessions   Status New      SLP SHORT TERM GOAL #2   Title Given verbal or visual prompt, pt will correctly ID written word/object/icon in field of 4-6 to communicate wants/needs with 75% accuracy given occasional mod A over 2 sessions    Time 10    Period --   visits   Status New      SLP SHORT TERM GOAL #3   Title Pt will verbally approximate 3 significant words for 5/10 trials given occasional fading to rare mod A over 2 sessions    Time 10    Period --   sessions   Status New              SLP Long Term Goals - 10/28/21 1742       SLP LONG TERM GOAL #1   Title Pt will use multimodal communication system with mod assist to express wants/needs given >2 choices over 2 sessions for 4/5 opportunities    Time 12     Period Weeks    Status New    Target Date 01/26/22      SLP LONG TERM GOAL #  2   Title Caregiver will demonstrate appropriate use of supported conversation and use of multimodal supports to aid pt's comprehension of personally relevant topics, over 4 sessions.    Time 12    Period Weeks    Status New    Target Date 01/26/22      SLP LONG TERM GOAL #3   Title Caregiver will appropriately cue patient when apraxic/aphasic errors occurs for 4/5 opportunities over 2 sessions    Time 12    Period Weeks    Status New    Target Date 01/26/22              Plan - 10/28/21 1721     Clinical Impression Statement Lindsey [name] presents with severe aphasia and severe verbal apraxia, chronic deficits exacerbated by new CVA in December 2022. Prior to this, pt was using Lingraphica to communicate wants/needs with family assistance. Pt now presenting with severe global aphasia, although auditory comprehension is slightly better than verbal expression. Patient scored in the severe range on form 2 of the Quick Aphasia Battery. Unable to establish reliable Y/N; significant verbal, oral, and ideomotor apraxia noted. Pt able to state her first name and some automatic speech (counting 1-10, days of the week with max cues), and approximate words of 1-2 syllables ~70% accuracy with max cues. Auditory comprehension at word level improves with written cue, ~60% accuracy. Daughter reports via CETI (Communication Effectiveness Index a severe decline in Lindsey [name]'s abilities in all 16 communication situations. She has been unable to use Lingraphica with patient and requests assistance in how to modify/simplify language system to use more effectively with patient. I recommend skilled ST with focus on functional communication to improve ability to communicate wants/needs, reduce frustration/social isolation and improve quality of life.    Speech Therapy Frequency 2x / week    Duration 12 weeks    Treatment/Interventions  Compensatory strategies;Patient/family education;Functional tasks;Cueing hierarchy;Environmental controls;Cognitive reorganization;Multimodal communcation approach;Language facilitation;Compensatory techniques;Internal/external aids;SLP instruction and feedback;Other (comment)    Potential to Achieve Goals Fair    Potential Considerations Severity of impairments;Previous level of function    SLP Home Exercise Plan bring Lingraphica to next session    Consulted and Agree with Plan of Care Patient;Family member/caregiver    Family Member Consulted daughter             Patient will benefit from skilled therapeutic intervention in order to improve the following deficits and impairments:   Aphasia  Verbal apraxia  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 [facility])    Problem List Patient Active Problem List   Diagnosis Date Noted   Elevated AST (SGOT) 10/22/2021   Lupus anticoagulant positive 10/22/2021   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)    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[place])    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 ([place]) 09/24/2021   Cerebral embolism with cerebral infarction 09/23/2021   Pyelonephritis 09/19/2021   Pain due to onychomycosis of toenails of both feet 11/19/2020   Normocytic anemia 05/31/2020   Acute ischemic left MCA stroke ([place]) 04/30/2020   Right hemiplegia ([place]) 04/30/2020   Cerebrovascular accident ([place]) 04/21/2020   Atrial fibrillation with RVR ([place]) 04/21/2020   Essential hypertension 04/21/2020   Alcohol abuse 04/21/2020   Deneise Lever, [place], [place], [place] 10/28/2021, 5:46 PM  [place]  [place], [place], [address] Phone: [phone]   Fax:  [phone]  Name: Lindsey [name] MRN:  [id number] Date of Birth: November 29, 1955

## 2021-10-28 NOTE — Telephone Encounter (Signed)
Called and left for pharmacy to call back to see if they received prescription

## 2021-10-28 NOTE — Telephone Encounter (Signed)
Called in Diltiazem 180 mg as Dr. Ranell Patrick prescriptions is no print. She tried sending it yesterday.

## 2021-10-28 NOTE — Therapy (Signed)
[facility] [address] [place], [place], [id number] Phone: [phone]   Fax:  [phone]  Occupational Therapy Evaluation  Patient Details  Name: Lindsey BruinColleen [name] MRN: [id number] Date of Birth: [birth year] Referring Provider (OT): Dr. 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 No pertinent surgical history.  There were no vitals filed for this visit.   Subjective Assessment - 10/28/21 1024     Subjective  Pt. was presesnt with her daughter    Patient is accompanied by: Family member    Pertinent History Pt. is a 66 y.o. 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 Pt. has Right sided weakness, and receptive, and expressive aphasia. Pt. has had a previous CVA with right sided weakness in July 2021. PMHx includes: AFib, Renal Artery thrombosis, situational depression, HTN, Pyelnephritis, Seizure activity, COnstipation, AKI. Vitamin D deficiency, and urinary incontinence/    Limitations Right side weakness, reptive, and expressive ahasia    Patient Stated Goals To be able  to dress herself.    Currently in Pain? No/denies               [facility] - 10/28/21 0838       Assessment   Medical Diagnosis CVA    Referring Provider (OT) Dr. Carlis Abbottaulkar    Onset Date/Surgical Date 09/19/22    Hand Dominance Right    Next MD Visit 11/05/2021    Prior Therapy OT/PT      Precautions   Precautions None      Restrictions   Weight Bearing Restrictions No      Home  Environment   Family/patient expects to be discharged to: Private residence    Living Arrangements Children    Available Help at Discharge Family    Type of Home House    Home Access Stairs    Home Layout Two level    Bathroom Shower/Tub Walk-in Shower    Bathroom Toilet Standard    Home Equipment [place] - single point;Bedside commode;Shower seat;Wheelchair - manual;Grab bars - tub/shower;Hand held Paramedicshower head   Stair chair lift, lift chair   Lives With Family      Prior Function   Level of Independence Independent    Vocation Part time employment    Engineer, manufacturingVocation Requirements Lobby at [employer]    Leisure [employer]      ADL   Eating/Feeding Independent  WIth the left hand   Grooming Set up    Lower Body Bathing Moderate assistance   Daughter assists with Left arm and back   Upper Body Dressing Maximal assistance    Lower Body Dressing Maximal assistance    Toilet Transfer Maximal assistance    Toileting - Clothing Manipulation Maximal assistance    Toileting -  Hygiene Maximal assistance    Tub/Shower Transfer Maximal assistance      IADL   Prior Level of Function Shopping MinA    Shopping Completely unable to shop    Prior Level of Function Light Housekeeping ModA    Light Housekeeping Needs help with all home maintenance tasks    Prior Level of Function Meal Prep ModA    Meal Prep Needs to have meals prepared and served    [employer] on family or friends for transportation    Prior Level of Function Medication Managment Dependent    Medication  Management Is not capable of dispensing or managing own medication    Prior Level of Function Financial Management Dependent    Financial Management Dependent      Written Expression   Dominant Hand Right    Handwriting Not legible      Vision - History   Baseline Vision Wears glasses all the time      Vision Assessment   Comment Continue ongoing assessment.      Cognition   Overall Cognitive Status History of cognitive impairments - at baseline    Cognition Comments Expressive, and recerptive Aphasia      Coordination   Gross Motor Movements are Fluid and Coordinated No    Fine Motor Movements are Fluid and Coordinated No      PROM   Overall PROM Comments Right Shoulder flexion 0/(95), abduction: 0(65), elbow 0(0-112), wrist flexion 0(75), wrist extension: 0(45)      Strength   Overall Strength Comments RUE: 0/5                              OT Education - 10/28/21 1042     Education Details OT services, goals, POC    Person(s) Educated Patient    Methods Explanation    Comprehension Verbalized understanding              OT Short Term Goals - 10/28/21 1054       OT SHORT TERM GOAL #1   Title Pt and caregiver will be independent with HEP for RUE    Baseline To be provided    Time 6    Period Weeks    Status New    Target Date 12/09/21               OT Long Term Goals - 10/28/21 1055       OT LONG TERM GOAL #1   Title Pt. will engage engage the RUE as a gross assist/atabilizer during ADLs 100% of the time    Baseline Eval: pt. does not currently engage her RUE during ADLs    Time 12    Period Weeks    Status New    Target Date 01/20/22      OT LONG TERM GOAL #2   Title Pt. will perform UE dressing using one armed dressing techniques    Baseline Eval: MaxA    Time 12    Period Weeks    Status New    Target Date  01/20/22      OT LONG TERM GOAL #3   Title Pt. will require minA to stand and hike pants    Baseline Eval:  MaxA    Time 12    Period Weeks    Status New    Target Date 01/20/22      OT LONG TERM GOAL #4   Title Pt. will perform LE dressing with minA    Baseline MaxA    Time 12    Period Weeks    Status New    Target Date 01/20/22      OT LONG TERM GOAL #5   Title Pt. will improve FOTO score by 2 pt. to reflect funcational change    Baseline Eval: FOTO score: 12    Time 12    Period Weeks    Status New    Target Date 01/20/22      Long Term Additional Goals   Additional Long Term Goals Yes                   Plan - 10/28/21 1044     Clinical Impression Statement Pt. is a 66 y.o. female who was diagnosed with a CVA secondary to stenosis of the left cerebellar artery. Pt. had receptive, and expressive Aphasia. Pt. has RUE hemiparesis. Pt. has a history of a CVA in July 2021 with right sided weakness. Pt. presents with impaired dominant RUE ROM which limits her ability to engage in basic ADL, and IADL care tasks. Pt. will benefit from OT skilled services to work on facilitating active, volitional, and functional movement in order to engage the RUE as a gross stabilizer during ADL tasks, improve self dressing skills using one armed dress techniques, and to improve functional bed mobility, and transfers.    OT Occupational Profile and History Detailed Assessment- Review of Records and additional review of physical, cognitive, psychosocial history related to current functional performance    Occupational performance deficits (Please refer to evaluation for details): Leisure;IADL's;ADL's    Body Structure / Function / Physical Skills ADL;IADL;FMC;ROM;UE functional use;Strength;Decreased knowledge of use of DME;Coordination    Rehab Potential Fair    Clinical Decision Making Several treatment options, min-mod task modification necessary    Comorbidities Affecting Occupational Performance: May have comorbidities impacting occupational performance    Modification or Assistance to Complete  Evaluation  Min-Moderate modification of tasks or assist with assess necessary to complete eval    OT Frequency 2x / week    OT Duration 12 weeks    OT Treatment/Interventions Self-care/ADL training;Neuromuscular education;Electrical Stimulation;Patient/family education;Therapeutic activities;Passive range of motion;Therapist, nutritional;Therapeutic exercise;DME and/or AE instruction;Moist Heat    Consulted and Agree with Plan of Care Patient             Patient will benefit from skilled therapeutic intervention in order to improve the following deficits and impairments:   Body Structure / Function / Physical Skills: ADL, IADL, FMC, ROM, UE functional use, Strength, Decreased knowledge of use of DME, Coordination       Visit Diagnosis: Muscle weakness (generalized)    Problem List Patient Active Problem List   Diagnosis Date Noted   Elevated AST (SGOT) 10/22/2021   Lupus anticoagulant positive 10/22/2021   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)    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[place])    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 [place], [place], [address] Phone: [phone]   Fax:  [phone]  Name: Brandylee Bertelli MRN: [id number] Date of Birth: 08-17-1956

## 2021-10-29 ENCOUNTER — Telehealth: Payer: Self-pay | Admitting: Internal Medicine

## 2021-10-29 NOTE — Telephone Encounter (Signed)
Called patient and discussed results with her daughter.  CRP is normal, ESR is elevated.  Relatively non-specific finding given overall clinical picture.  Will hold off further imaging given clinical improvement and stability off antibiotics for greater than 1 week.  Additionally, her prior imaging 09/26/21 showed resolving small right kidney abscess and patient received almost 4 weeks of additional antibiotics until 10/20/21.  Return precautions discussed with patients daughter.    Lindsey [name] for Infectious Disease [facility] 10/29/2021, 11:32 AM

## 2021-11-02 ENCOUNTER — Ambulatory Visit: Payer: Medicare Other | Admitting: Occupational Therapy

## 2021-11-02 ENCOUNTER — Encounter: Payer: Self-pay | Admitting: Occupational Therapy

## 2021-11-02 ENCOUNTER — Other Ambulatory Visit: Payer: Self-pay

## 2021-11-02 DIAGNOSIS — R4701 Aphasia: Secondary | ICD-10-CM | POA: Diagnosis not present

## 2021-11-02 DIAGNOSIS — M6281 Muscle weakness (generalized): Secondary | ICD-10-CM

## 2021-11-02 NOTE — Therapy (Signed)
[place] MAIN [facility] SERVICES [address] [place], [place], [address] Phone: [phone]   Fax:  [phone]  Occupational Therapy Treatment  Patient Details  Name: Lindsey [name] MRN: [id number] Date of Birth: Dec 29, 1955 Referring Provider (OT): Dr. 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 No pertinent surgical history.  There were no vitals filed for this visit.   Subjective Assessment - 11/02/21 1622     Subjective  Pt. was present at therapy with her grandson.    Patient is accompanied by: Family member    Pertinent History Pt. is a 66 y.o. 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 Pt. has Right sided weakness, and receptive, and expressive aphasia. Pt. has had a previous CVA with right sided weakness in July 2021. PMHx includes: AFib, Renal Artery thrombosis, situational depression, HTN, Pyelnephritis, Seizure activity, COnstipation, AKI. Vitamin D deficiency, and urinary incontinence/    Currently in Pain? No/denies            OT TREATMENT    Therapeutic  Exercise:  Pt. tolerated PROM in all joint ranges of the RUE. Pt. tolerated shoulder flexion, abduction, horizontal abduction, elbow flexion, and extension, forearm supination, and pronation, wrist extension, digit MP, PIP, and DIP flexion, extension, and thumb radial, and palmar abduction. Pt. performed self-ROM with verbal cues, and cues for visual demonstration. Pt. Worked on performing hand to face patterns with hand over hand assist with the RUE.  Manual Therapy:  Pt. Tolerated scapular mobilizations through elevation, depression, abduction/rotation to normalize tone. Pt. Tolerated soft tissue mobilizations radial on ulna for supination, and metacarpal spread stretches. Manual therapy was performed independent of, and in preparation for there. Ex.  Pt. tolerated ROM well, and required hand-over hand assist for all hand to face attempts with the right in preparation for self grooming skills. Pt. required maxA to donn her jacket with step-by step cues. Pt. continues to work on normalizing tone, and facilitating functional volitional movement in the RUE in order to assist with self dressing, ADLs, and transfers.                       OT Education - 11/02/21 1623     Education Details RUE rom    Person(s) Educated Patient    Methods Explanation    Comprehension Verbalized understanding  OT Short Term Goals - 10/28/21 1054       OT SHORT TERM GOAL #1   Title Pt and caregiver will be independent with HEP for RUE    Baseline To be provided    Time 6    Period Weeks    Status New    Target Date 12/09/21               OT Long Term Goals - 10/28/21 1055       OT LONG TERM GOAL #1   Title Pt. will engage engage the RUE as a gross assist/atabilizer during ADLs 100% of the time    Baseline Eval: pt. does not currently engage her RUE during ADLs    Time 12    Period Weeks    Status New    Target Date 01/20/22      OT LONG TERM GOAL #2   Title  Pt. will perform UE dressing using one armed dressing techniques    Baseline Eval: MaxA    Time 12    Period Weeks    Status New    Target Date 01/20/22      OT LONG TERM GOAL #3   Title Pt. will require minA to stand and hike pants    Baseline Eval: MaxA    Time 12    Period Weeks    Status New    Target Date 01/20/22      OT LONG TERM GOAL #4   Title Pt. will perform LE dressing with minA    Baseline MaxA    Time 12    Period Weeks    Status New    Target Date 01/20/22      OT LONG TERM GOAL #5   Title Pt. will improve FOTO score by 2 pt. to reflect funcational change    Baseline Eval: FOTO score: 12    Time 12    Period Weeks    Status New    Target Date 01/20/22      Long Term Additional Goals   Additional Long Term Goals Yes                   Plan - 11/02/21 1624     Clinical Impression Statement Pt. tolerated ROM well, and required hand-over hand assist for all hand to face attempts with the right in preparation for self grooming skills. Pt. required maxA to donn her jacket with step-by step cues. Pt. continues to work on normalizing tone, and facilitating functional volitional movement in the RUE in order to assist with self dressing, ADLs, and transfers.        OT Occupational Profile and History Detailed Assessment- Review of Records and additional review of physical, cognitive, psychosocial history related to current functional performance    Occupational performance deficits (Please refer to evaluation for details): Leisure;IADL's;ADL's    Body Structure / Function / Physical Skills ADL;IADL;FMC;ROM;UE functional use;Strength;Decreased knowledge of use of DME;Coordination    Rehab Potential Fair    Clinical Decision Making Several treatment options, min-mod task modification necessary    Comorbidities Affecting Occupational Performance: May have comorbidities impacting occupational performance    Modification or Assistance to Complete Evaluation   Min-Moderate modification of tasks or assist with assess necessary to complete eval    OT Frequency 2x / week    OT Duration 12 weeks    OT Treatment/Interventions Self-care/ADL training;Neuromuscular education;Electrical Stimulation;Patient/family education;Therapeutic activities;Passive range of motion;Therapist, nutritional;Therapeutic exercise;DME and/or AE  instruction;Moist Heat    Consulted and Agree with Plan of Care Patient             Patient will benefit from skilled therapeutic intervention in order to improve the following deficits and impairments:   Body Structure / Function / Physical Skills: ADL, IADL, FMC, ROM, UE functional use, Strength, Decreased knowledge of use of DME, Coordination       Visit Diagnosis: Muscle weakness (generalized)    Problem List Patient Active Problem List   Diagnosis Date Noted   Elevated AST (SGOT) 10/22/2021   Lupus anticoagulant positive 10/22/2021   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)    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[place])    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 [place], [place], [address] Phone: [phone]   Fax:  [phone]  Name: Lindsey [name] MRN: [id number] Date of Birth: 08-29-1956

## 2021-11-04 ENCOUNTER — Ambulatory Visit: Payer: Medicare Other | Attending: Physical Medicine and Rehabilitation | Admitting: Occupational Therapy

## 2021-11-04 ENCOUNTER — Ambulatory Visit: Payer: Medicare Other | Admitting: Speech Pathology

## 2021-11-04 ENCOUNTER — Other Ambulatory Visit: Payer: Self-pay

## 2021-11-04 ENCOUNTER — Encounter: Payer: Self-pay | Admitting: Occupational Therapy

## 2021-11-04 DIAGNOSIS — I63512 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 middle cerebral artery: Secondary | ICD-10-CM | POA: Insufficient documentation

## 2021-11-04 DIAGNOSIS — I63542 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: Secondary | ICD-10-CM | POA: Diagnosis present

## 2021-11-04 DIAGNOSIS — R262 Difficulty in walking, not elsewhere classified: Secondary | ICD-10-CM | POA: Insufficient documentation

## 2021-11-04 DIAGNOSIS — R269 Unspecified abnormalities of gait and mobility: Secondary | ICD-10-CM | POA: Diagnosis present

## 2021-11-04 DIAGNOSIS — R482 Apraxia: Secondary | ICD-10-CM | POA: Insufficient documentation

## 2021-11-04 DIAGNOSIS — M6281 Muscle weakness (generalized): Secondary | ICD-10-CM | POA: Diagnosis not present

## 2021-11-04 DIAGNOSIS — R278 Other lack of coordination: Secondary | ICD-10-CM | POA: Diagnosis present

## 2021-11-04 DIAGNOSIS — R4701 Aphasia: Secondary | ICD-10-CM | POA: Diagnosis present

## 2021-11-04 DIAGNOSIS — R2689 Other abnormalities of gait and mobility: Secondary | ICD-10-CM | POA: Diagnosis present

## 2021-11-04 NOTE — Therapy (Signed)
[place] MAIN [facility] SERVICES [address] [place], [place], [address] Phone: [phone]   Fax:  [phone]  Speech Language Pathology Treatment  Patient Details  Name: Lindsey [name] MRN: [id number] Date of Birth: 1956-05-15 Referring Provider (SLP): Dr. 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  There were no vitals filed for this visit.   Subjective Assessment - 11/04/21 1814     Subjective Daughter brought Lingraphica device    Patient is accompained by: Family member   daughter Lindsey [name]   Currently in Pain? No/denies                   ADULT SLP TREATMENT - 11/04/21 1815       General Information   Behavior/Cognition Alert;Cooperative;Pleasant mood    HPI Patient is a 66 y.o. female with prior history of CVA in 2021 with residual aphasia (using Lingraphica AAC device to assist with communication as of 08/2021) referred by Dr. Ranell Patrick after recent hospital admission when she was found to have new L AICA infarct complicated by pyelonephritis. Admitted to [facility] and then CIR 12/18-12/30/22. MRI 09/22/21 showed acute moderate size L AICA cerebellar infarct, underlying extensive chronic left MCA encephalomalacia related to 2021  infarct, left brainstem Wallerian degeneration and chronic left PICA cerebellar infarcts.      Treatment Provided   Treatment provided Cognitive-Linquistic      Cognitive-Linquistic Treatment   Treatment focused on Aphasia;Apraxia;Patient/family/caregiver education    Skilled Treatment Worked with pt/daughter to simplify Lingraphica icons to limit choices on screen from F:10 to F:3. Functional imitation/repetition: Pt imitated 3/4 names accurately with moderate cues (visual, written, imitation). Encouraged daughter to take photos of grandchildren to add to icons. Removed un-used icons and elicited functional communication needs. Daughter reported pt making breakfast choices, as well as making requests for help are highest priority. Temporarily programmed these using images from the device, but encouraged daughter to take photos of pt's own bed, toilet, clothing, and glasses to reduce number of abstract images on pt's device. Pt able to indicate what she had for snack today (cupcake) as well as what she wants for snack this afternoon (chips) with min cues to select vs point to icons. Y/N in context (food likes/dislikes): pt used speech paired with gesture 70% accuracy to answer Y/N re: likes/dislikes with visual supports. Demonstrated Therapy Mode on the device; pt able to imitate at word level 60% accuracy. Imitates carrier phrase "I want" with approximation of target after selecting icon 50% accuracy.      Assessment /  Recommendations / Plan   Plan Continue with current plan of care      Progression Toward Goals   Progression toward goals Progressing toward goals              SLP Education - 11/04/21 1829     Education Details actual photos maybe more salient for pt    Person(s) Educated Patient;Child(ren)    Methods Explanation    Comprehension Verbalized understanding              SLP Short Term Goals - 10/28/21 1739       SLP SHORT TERM GOAL #1   Title Pt will answer  personally relevant Y/N questions in context using multimodal communication or AAC if necessary, 75% accuracy.    Time 10    Period --   sessions   Status New      SLP SHORT TERM GOAL #2   Title Given verbal or visual prompt, pt will correctly ID written word/object/icon in field of 4-6 to communicate wants/needs with 75% accuracy given occasional mod A over 2 sessions    Time 10    Period --   visits   Status New      SLP SHORT TERM GOAL #3   Title Pt will verbally approximate 3 significant words for 5/10 trials given occasional fading to rare mod A over 2 sessions    Time 10    Period --   sessions   Status New              SLP Long Term Goals - 10/28/21 1742       SLP LONG TERM GOAL #1   Title Pt will use multimodal communication system with mod assist to express wants/needs given >2 choices over 2 sessions for 4/5 opportunities    Time 12    Period Weeks    Status New    Target Date 01/26/22      SLP LONG TERM GOAL #2   Title Caregiver will demonstrate appropriate use of supported conversation and use of multimodal supports to aid pt's comprehension of personally relevant topics, over 4 sessions.    Time 12    Period Weeks    Status New    Target Date 01/26/22      SLP LONG TERM GOAL #3   Title Caregiver will appropriately cue patient when apraxic/aphasic errors occurs for 4/5 opportunities over 2 sessions    Time 12    Period Weeks    Status New    Target Date 01/26/22              Plan - 11/04/21 1830     Clinical Impression Statement Lindsey [name] presents with severe aphasia and severe verbal apraxia, chronic deficits exacerbated by new CVA in December 2022. Prior to this, pt was using Lingraphica to communicate wants/needs with family assistance. Pt now presenting with severe global aphasia, although auditory comprehension is slightly better than verbal expression. Patient was engaged in session today and appeared to enjoy imitating/repeating personally  relevant words (family, foods, needs) using her Lingraphica once this was modified to reduce level of complexity. I recommend skilled ST with focus on functional communication to improve ability to communicate wants/needs, reduce frustration/social isolation and improve quality of life.    Speech Therapy Frequency 2x / week    Duration 12 weeks    Treatment/Interventions Compensatory strategies;Patient/family education;Functional tasks;Cueing hierarchy;Environmental controls;Cognitive reorganization;Multimodal communcation approach;Language facilitation;Compensatory techniques;Internal/external aids;SLP instruction and feedback;Other (comment)    Potential  to Achieve Goals Fair    Potential Considerations Severity of impairments;Previous level of function    SLP Home Exercise Plan bring Lingraphica to next session    Consulted and Agree with Plan of Care Patient;Family member/caregiver    Family Member Consulted daughter             Patient will benefit from skilled therapeutic intervention in order to improve the following deficits and impairments:   Aphasia  Verbal apraxia    Problem List Patient Active Problem List   Diagnosis Date Noted   Elevated AST (SGOT) 10/22/2021   Lupus anticoagulant positive 10/22/2021   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)    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[place])    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 ([place]) 09/24/2021   Cerebral embolism with cerebral infarction 09/23/2021   Pyelonephritis 09/19/2021   Pain due to onychomycosis of toenails of both feet 11/19/2020   Normocytic anemia 05/31/2020   Acute ischemic left MCA stroke ([place]) 04/30/2020   Right hemiplegia ([place]) 04/30/2020   Cerebrovascular accident ([place]) 04/21/2020   Atrial fibrillation with RVR ([place]) 04/21/2020   Essential hypertension 04/21/2020   Alcohol abuse 04/21/2020   Deneise Lever,  [place], CCC-SLP Speech-Language Pathologist   Aliene Altes, [place] 11/04/2021, 6:32 PM  [place] MAIN [place] [address] [place], [place], [address] Phone: [phone]   Fax:  [phone]   Name: Lindsey [name] MRN: [id number] Date of Birth: 28-May-1956

## 2021-11-04 NOTE — Therapy (Signed)
[place] MAIN [facility] SERVICES [address] [place], [place], [address] Phone: [phone]   Fax:  [phone]  Occupational Therapy Treatment  Patient Details  Name: Lindsey [name] MRN: [id number] Date of Birth: May 03, 1956 Referring Provider (OT): Dr. 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 No pertinent surgical history.  There were no vitals filed for this visit.   Subjective Assessment - 11/04/21 1804     Subjective  Pt. was present at therapy with her daughter.    Patient is accompanied by: Family member    Pertinent History Pt. is a 66 y.o. 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 Pt. has Right sided weakness, and receptive, and expressive aphasia. Pt. has had a previous CVA with right sided weakness in July 2021. PMHx includes: AFib, Renal Artery thrombosis, situational depression, HTN, Pyelnephritis, Seizure activity, COnstipation, AKI. Vitamin D deficiency, and urinary incontinence/    Currently in Pain? No/denies            OT TREATMENT     Therapeutic Exercise:    Pt. tolerated PROM in all joint ranges of the RUE. Pt. tolerated shoulder flexion, abduction, horizontal abduction, elbow flexion, and extension, forearm supination, and pronation, wrist extension, digit MP, PIP, and DIP flexion, extension, and thumb radial, and palmar abduction. Pt. Attempted to use use to UBE for the reverse motion only for a few reps with an ACE wrap, and RUE support. Pt. performed self-ROM with verbal cues, and cues for visual demonstration. Pt. Worked on performing hand to face patterns with hand over hand assist with the RUE.    Manual Therapy:   Pt. Tolerated scapular mobilizations through elevation, depression, abduction/rotation to normalize tone. Pt. Tolerated soft tissue mobilizations radial on ulna for supination, and metacarpal spread stretches. Manual therapy was performed independent of, and in preparation for there. Ex.   Pt. Tolerated moist heat to the right shoulder in conjunction with hand in preparation for range of motion, and manual therapy. Pt. required ModA transferring from the transport chair with moderate cues. Pt. Required heavy cues for motor planning, and to weightbear through her RLE when attempting to step and turn with the left.  Pt. Required minA to transfer from the mat back to the chair, requiring fewer cues for RLE weightbearing during the transfer. Pt. Required assist with her RLE when transitioning from sit to supine. Fewer cues were required for the RLE  during supine to sit. Pt. Continues to present with tightness proximally in the RUE, and works toward being able to position her RUE, and hand in a position for weightbearing. Pt. continues to work on normalizing tone, and facilitating functional volitional movement in the RUE in order to assist with self dressing, ADLs, and transfers.                           OT Education - 11/04/21 1804     Education Details RUE rom    Person(s) Educated Patient    Methods Explanation     Comprehension Verbalized understanding              OT Short Term Goals - 10/28/21 1054       OT SHORT TERM GOAL #1   Title Pt and caregiver will be independent with HEP for RUE    Baseline To be provided    Time 6    Period Weeks    Status New    Target Date 12/09/21               OT Long Term Goals - 10/28/21 1055       OT LONG TERM GOAL #1   Title Pt. will engage engage the RUE as a gross assist/atabilizer during ADLs 100% of the time    Baseline Eval: pt. does not currently engage her RUE during ADLs    Time 12    Period Weeks    Status New    Target Date 01/20/22      OT LONG TERM GOAL #2   Title Pt. will perform UE dressing using one armed dressing techniques    Baseline Eval: MaxA    Time 12    Period Weeks    Status New    Target Date 01/20/22      OT LONG TERM GOAL #3   Title Pt. will require minA to stand and hike pants    Baseline Eval: MaxA    Time 12    Period Weeks    Status New    Target Date 01/20/22      OT LONG TERM GOAL #4   Title Pt. will perform LE dressing with minA    Baseline MaxA    Time 12    Period Weeks    Status New    Target Date 01/20/22      OT LONG TERM GOAL #5   Title Pt. will improve FOTO score by 2 pt. to reflect funcational change    Baseline Eval: FOTO score: 12    Time 12    Period Weeks    Status New    Target Date 01/20/22      Long Term Additional Goals   Additional Long Term Goals Yes                   Plan - 11/04/21 1804     Clinical Impression Statement Pt. Tolerated moist heat to the right shoulder in conjunction with hand in preparation for range of motion, and manual therapy. Pt. required ModA transferring from the transport chair with moderate cues. Pt. Required heavy cues for motor planning, and to weightbear through her RLE when attempting to step and turn with the left.  Pt. Required minA to transfer from the mat back to the chair, requiring fewer cues for RLE weightbearing during  the transfer. Pt. Required assist with her RLE when transitioning from  sit to supine. Fewer cues were required for the RLE during supine to sit. Pt. Continues to present with tightness proximally in the RUE, and works toward being able to position her RUE, and hand in a position for weightbearing. Pt. continues to work on normalizing tone, and facilitating functional volitional movement in the RUE in order to assist with self dressing, ADLs, and transfers.         OT Occupational Profile and History Detailed Assessment- Review of Records and additional review of physical, cognitive, psychosocial history related to current functional performance    Occupational performance deficits (Please refer to evaluation for details): Leisure;IADL's;ADL's    Body Structure / Function / Physical Skills ADL;IADL;FMC;ROM;UE functional use;Strength;Decreased knowledge of use of DME;Coordination    Rehab Potential Fair    Clinical Decision Making Several treatment options, min-mod task modification necessary    Comorbidities Affecting Occupational Performance: May have comorbidities impacting occupational performance    Modification or Assistance to Complete Evaluation  Min-Moderate modification of tasks or assist with assess necessary to complete eval    OT Frequency 2x / week    OT Duration 12 weeks    OT Treatment/Interventions Self-care/ADL training;Neuromuscular education;Electrical Stimulation;Patient/family education;Therapeutic activities;Passive range of motion;Therapist, nutritional;Therapeutic exercise;DME and/or AE instruction;Moist Heat    Consulted and Agree with Plan of Care Patient             Patient will benefit from skilled therapeutic intervention in order to improve the following deficits and impairments:   Body Structure / Function / Physical Skills: ADL, IADL, FMC, ROM, UE functional use, Strength, Decreased knowledge of use of DME, Coordination       Visit Diagnosis: Muscle  weakness (generalized)  Other lack of coordination    Problem List Patient Active Problem List   Diagnosis Date Noted   Elevated AST (SGOT) 10/22/2021   Lupus anticoagulant positive 10/22/2021   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)    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[place])    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 [place], [place], [address] Phone: [phone]   Fax:  [phone]  Name: Lindsey [name] MRN: [id number] Date of Birth: Jan 19, 1956

## 2021-11-05 ENCOUNTER — Ambulatory Visit (INDEPENDENT_AMBULATORY_CARE_PROVIDER_SITE_OTHER): Payer: Medicare Other | Admitting: Urology

## 2021-11-05 ENCOUNTER — Encounter: Payer: Self-pay | Admitting: Urology

## 2021-11-05 ENCOUNTER — Other Ambulatory Visit: Payer: Self-pay

## 2021-11-05 VITALS — BP 130/82 | HR 80 | Ht 67.0 in | Wt 130.0 lb

## 2021-11-05 DIAGNOSIS — Z87448 Personal history of other diseases of urinary system: Secondary | ICD-10-CM

## 2021-11-05 DIAGNOSIS — N280 Ischemia and infarction of kidney: Secondary | ICD-10-CM

## 2021-11-05 NOTE — Progress Notes (Signed)
11/05/2021 12:25 PM   Kara Drilling [birth year] [id number]  Referring provider: Milinda Antis, PA-C [address] [place],  [place] [address]  Chief Complaint  Patient presents with   Other    HPI: Lindsey [name] is a 66 y.o. female who presents in follow-up of recent hospitalization complicated by pyelonephritis and renal artery thrombosis.  She presents today with her daughter.  Hospitalized [facility] 09/19/2021 with nausea, vomiting, diaphoresis and slurred speech CT abdomen and pelvis performed with radiographic findings consistent with multifocal right pyelonephritis.  Had a negative urine culture and no leukocytosis or fever Abdominal MRI was felt to show a possible developing right renal abscess measuring 3.5 cm Follow-up CT 09/26/2021 showed decreased size of the developing abscess/phlegmon and multifocal hypoenhancement felt consistent with pyelonephritis There was a new finding of markedly abnormal appearance of the left kidney with multifocal hypoenhancement involving a large portion of the upper and mid pole of the left kidney and a thrombus of the left main renal artery No follow-up imaging since that time She denies fever, chills, flank or abdominal pain Last creatinine 10/20/2021 was 0.98 Prior to that hospitalization denied previous history of recurrent UTI or pyelonephritis During that hospitalization she was also found to have cerebellar infarcts  PMH: 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  Home Medications:  Allergies as of 11/05/2021   No Known Allergies       Medication List        Accurate as of November 05, 2021 12:25 PM. If you have any questions, ask your nurse or doctor.          acetaminophen 325 MG tablet Commonly known as: TYLENOL Take 1-2 tablets (325-650 mg total) by mouth every 4 (four) hours as needed for mild pain.   atorvastatin 20 MG tablet Commonly known as: LIPITOR Take 1 tablet (20 mg total) by mouth daily at 6 PM.   B-complex with vitamin C tablet Take 1 tablet by mouth daily.   Baclofen 5 MG Tabs Take 1 tablet (5 mg) by mouth at bedtime.   diltiazem 180 MG 24 hr capsule Commonly known as: CARDIZEM CD Take 1 capsule (180 mg total) by mouth daily.   levETIRAcetam 500 MG tablet Commonly known as: KEPPRA Take 1 tablet (500 mg total) by mouth every 12 (twelve) hours.   metoprolol tartrate 50 MG tablet Commonly known as: LOPRESSOR Take 1 tablet (50 mg total) by mouth 2 (two) times daily.   metoprolol tartrate 50 MG tablet Commonly known as: LOPRESSOR Take 1 tablet (50 mg total) by mouth 2 (two) times daily.   Multivitamin Adults 50+ Tabs Take 1 tablet by mouth daily.   sertraline 25 MG tablet Commonly known as: ZOLOFT Take 1 tablet (25 mg total) by mouth at bedtime.   Vitamin D (Ergocalciferol) 1.25 MG (50000 UNIT) Caps capsule Commonly known as: DRISDOL Take 1 capsule (50,000 Units total) by mouth every 7 (seven) days. Each Tuesday   warfarin 2.5 MG tablet Commonly known as: COUMADIN Take 1 tablet (  2.5 mg total) by mouth daily at 4 PM.        Allergies: No Known Allergies  Family History: Family History  Problem Relation Age of Onset   Breast cancer Neg Hx     Social History:  reports that she has never smoked. She has never been exposed to tobacco smoke. She has never used smokeless tobacco. She reports that she does not currently use alcohol. She reports that she does not use drugs.   Physical Exam: BP 130/82    Pulse 80    Ht 5\' 7"  (1.702 m)    Wt 130 lb (59 kg)    BMI 20.36 kg/m    Constitutional:  Alert, no acute distress. HEENT: [place] AT, moist mucus membranes.  Trachea midline, no masses. Cardiovascular: No clubbing, cyanosis, or edema. Respiratory: Normal respiratory effort, no increased work of breathing.   Pertinent Imaging: CT images December 2022 were personally reviewed and interpreted   Assessment & Plan:   66 y.o. female with CT findings consistent with multifocal right pyelonephritis and 3 cm developing abscess versus phlegmon Follow-up CT showed a left renal artery thrombus with evidence of partial renal infarct Schedule follow-up CT abdomen with contrast   Abbie Sons, MD  [place] [address] [place], [place] [address] [phone]

## 2021-11-06 ENCOUNTER — Encounter: Payer: Self-pay | Admitting: Urology

## 2021-11-08 ENCOUNTER — Encounter: Payer: Self-pay | Admitting: *Deleted

## 2021-11-09 ENCOUNTER — Ambulatory Visit: Payer: Medicare Other | Admitting: Speech Pathology

## 2021-11-09 ENCOUNTER — Other Ambulatory Visit: Payer: Self-pay

## 2021-11-09 ENCOUNTER — Ambulatory Visit: Payer: Medicare Other

## 2021-11-09 DIAGNOSIS — M6281 Muscle weakness (generalized): Secondary | ICD-10-CM | POA: Diagnosis not present

## 2021-11-09 DIAGNOSIS — I63512 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 middle cerebral artery: Secondary | ICD-10-CM

## 2021-11-09 DIAGNOSIS — R482 Apraxia: Secondary | ICD-10-CM

## 2021-11-09 DIAGNOSIS — R278 Other lack of coordination: Secondary | ICD-10-CM

## 2021-11-09 DIAGNOSIS — R4701 Aphasia: Secondary | ICD-10-CM

## 2021-11-09 NOTE — Patient Instructions (Signed)
Continue practice with AAC device at home with family

## 2021-11-09 NOTE — Therapy (Signed)
[name] MAIN [facility] SERVICES [address] [place], [place], [address] Phone: [phone]   Fax:  [phone]  Speech Language Pathology Treatment  Patient Details  Name: Lindsey [name] MRN: [id number] Date of Birth: 1956-05-20 Referring Provider (SLP): Dr. 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  There were no vitals filed for this visit.   Subjective Assessment - 11/09/21 1145     Subjective Patient presents in [facility], motivated and pleasantly engaged for session; [name] with patient    Currently in Pain? No/denies    Pain Score 0-No pain                   ADULT SLP TREATMENT - 11/09/21 0001       General Information   Behavior/Cognition Alert;Cooperative;Pleasant mood    HPI Patient is a 66 y.o. female with prior history of CVA in 2021 with residual aphasia (using Lingraphica AAC device to assist with communication as of 08/2021) referred by Dr. Ranell Patrick after recent hospital admission when she was found to have new L AICA infarct complicated by pyelonephritis. Admitted to [facility] and then CIR 12/18-12/30/22. 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      Treatment Provided   Treatment provided Cognitive-Linquistic      Cognitive-Linquistic Treatment   Treatment focused on Aphasia;Apraxia    Skilled Treatment Skilled intervention targeted increased accuracy of y/n for wants/needs. Patient demonstrated verbal y/n for simple personal information with 100% accuracy x5. Increased challenge for environmental simple information with 70% accuracy x10. Occasionally patient with different verbal/gesture and written selection for the same question: patient demonstrated 40% accuracy x5 verbal responses, 40% accuracy x5 written selections, though 100% accuracy x5 gestural head nodding responses for y/n (the above data was for the same x5 simple y/n questions. Recommended family/patient utilize gestural head nodding for confirmation of responses with continued practice of verbal and written selection of y/n. Utilized AAC device lingraphica with newly formatted F3 from last session. [place] dmeonstrated accurate selecition in F3 with 87% acc x15--errors on help items, improved to100% with max assistance via errorless learning repetitive training. Patient challenged by continued alternating categories with 60% accuracy x10 for previoiusly trialed f:3 responses though again x4 errors within the help category. Patient demonstrated accurate selection of categories without expicit verbal prompt (ex: asked for drinks which requires seleciton of food main category) completed with 40% accuracy  x5 improved to 100% accuracy with mod-max assist via field selection reduced. Targeted verbal speech at word level for important x5 names completed with visual (look at lips) and verbal demo.      Assessment / Recommendations / Plan   Plan Continue with current plan of care      Progression Toward Goals   Progression toward goals Progressing toward goals               SLP Education - 11/09/21 1200     Education Details AAC selection items and increased choices with continued practice    Person(s) Educated Patient;Child(ren);Caregiver(s)    Methods Explanation;Demonstration    Comprehension Verbalized understanding              SLP Short Term Goals - 10/28/21 1739       SLP SHORT TERM GOAL #1   Title Pt will answer personally relevant Y/N questions in context using multimodal communication or AAC if necessary, 75% accuracy.    Time 10    Period --   sessions   Status New      SLP SHORT TERM GOAL #2   Title Given verbal or visual prompt, pt will correctly ID written word/object/icon in field of 4-6 to communicate wants/needs with 75% accuracy given occasional mod A over 2 sessions    Time 10    Period --   visits   Status New      SLP SHORT TERM GOAL #3   Title Pt will verbally approximate 3 significant words for 5/10 trials given occasional fading to rare mod A over 2 sessions    Time 10    Period --   sessions   Status New              SLP Long Term Goals - 10/28/21 1742       SLP LONG TERM GOAL #1   Title Pt will use multimodal communication system with mod assist to express wants/needs given >2 choices over 2 sessions for 4/5 opportunities    Time 12    Period Weeks    Status New    Target Date 01/26/22      SLP LONG TERM GOAL #2   Title Caregiver will demonstrate appropriate use of supported conversation and use of multimodal supports to aid pt's comprehension of personally relevant topics, over 4 sessions.    Time 12    Period Weeks    Status New    Target Date 01/26/22      SLP LONG TERM GOAL #3   Title Caregiver will appropriately cue patient when apraxic/aphasic errors occurs for 4/5 opportunities over 2 sessions    Time 12    Period Weeks    Status New    Target Date 01/26/22              Plan - 11/09/21 1201     Clinical Impression Statement Bra[place] presents with severe aphasia and  severe verbal apraxia, chronic deficits exacerbated by new CVA in December 2022. Prior to this, pt was using Lingraphica to communicate wants/needs with family assistance. Pt now presenting with severe global aphasia, although auditory comprehension is slightly better than verbal expression. Patient was engaged in session today and demonstrated improved AAC use via [place] with modified level of complexity established last week, patient continues to benefit from training for more challenging-verb selections within help category. I recommend skilled ST with focus on functional communication to improve ability to communicate wants/needs, reduce frustration/social  isolation and improve quality of life.    Speech Therapy Frequency 2x / week    Duration 12 weeks    Treatment/Interventions Compensatory strategies;Patient/family education;Functional tasks;Cueing hierarchy;Environmental controls;Cognitive reorganization;Multimodal communcation approach;Language facilitation;Compensatory techniques;Internal/external aids;SLP instruction and feedback;Other (comment)    Potential to Achieve Goals Fair    Potential Considerations Severity of impairments;Previous level of function    SLP Home Exercise Plan bring Lingraphica to next session    Consulted and Agree with Plan of Care Patient;Family member/caregiver    Family Member Consulted daughter             Patient will benefit from skilled therapeutic intervention in order to improve the following deficits and impairments:   Aphasia  Verbal apraxia  Acute ischemic left MCA stroke [place])    Problem List Patient Active Problem List   Diagnosis Date Noted   Elevated AST (SGOT) 10/22/2021   Lupus anticoagulant positive 10/22/2021   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)    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 Briar Witherspoon, M.A. [place] [phone]  [place], [place] 11/09/2021, 12:04 PM  [place] MAIN [facility] SERVICES [address] [place], [place], [address] Phone: [phone]   Fax:  [phone]   Name: Tyhesha Hooper MRN: [id number] Date of Birth: 11-12-1955

## 2021-11-10 NOTE — Therapy (Signed)
[place] MAIN [facility] SERVICES [address] [place], [place], [address] Phone: [phone]   Fax:  [phone]  Occupational Therapy Treatment  Patient Details  Name: Lindsey [name] MRN: [id number] Date of Birth: 07/03/1956 Referring Provider (OT): Dr. 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  There were no vitals filed for this visit.   Subjective Assessment - 11/09/21 0808     Subjective  Grandson present today with pt and able to answer questions as needed.    Patient is accompanied by: Family member    Pertinent History Pt. is a 66 y.o. 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 Pt. has Right sided weakness, and receptive, and expressive aphasia. Pt. has had a previous CVA with right sided weakness in July 2021. PMHx includes: AFib, Renal Artery thrombosis, situational depression, HTN, Pyelnephritis, Seizure activity, COnstipation, AKI. Vitamin D deficiency, and urinary incontinence/    Limitations Right side weakness, reptive, and expressive ahasia    Patient Stated Goals To  be able to dress herself.    Currently in Pain? No/denies    Pain Score 0-No pain            Occupational Therapy Treatment: Therapeutic Exercise: Performed passive stretching throughout RUE, shoulder flex/abd/horiz abd/add, retraction/depression of scapula, elbow ext, forearm pron/sup, wrist and digit ext to reduce tone in prep for functional transfer training.  Good tolerance to all.    Self Care: Participated in fxl transfer training wc<>mat table with practice transferring toward affected and non-affected side, mod A with vc and tactile cues for forward weight shifting and hand placement.  OT assisted with placement of R foot and stabilized R foot and knee during transfer.  Practiced sit to stand with focus on forward weight shifting, then once standing practiced lateral weight shifting required for standing ADLs.  Min-mod A for sit to stand, with max sequencing cues, cues for hand placement, and placed chair in front of pt for reaching target to promote forward weight shifting during ascent to stand.  Simulated hiking and lowering waist of pants with theraband around [place].  Pt hiked and lowered theraband x3 in standing with min A for stability, wide BOS.    Response to Treatment: See Plan/clinical impression below.    OT Education - 11/10/21 0810     Education Details fxl transfers    Person(s) Educated Patient;Other (comment)  Methods Explanation;Demonstration;Tactile cues    Comprehension Returned demonstration;Verbal cues required;Need further instruction;Tactile cues required              OT Short Term Goals - 10/28/21 1054       OT SHORT TERM GOAL #1   Title Pt and caregiver will be independent with HEP for RUE    Baseline To be provided    Time 6    Period Weeks    Status New    Target Date 12/09/21               OT Long Term Goals - 10/28/21 1055       OT LONG TERM GOAL #1   Title Pt. will engage engage the RUE as a gross assist/atabilizer during  ADLs 100% of the time    Baseline Eval: pt. does not currently engage her RUE during ADLs    Time 12    Period Weeks    Status New    Target Date 01/20/22      OT LONG TERM GOAL #2   Title Pt. will perform UE dressing using one armed dressing techniques    Baseline Eval: MaxA    Time 12    Period Weeks    Status New    Target Date 01/20/22      OT LONG TERM GOAL #3   Title Pt. will require minA to stand and hike pants    Baseline Eval: MaxA    Time 12    Period Weeks    Status New    Target Date 01/20/22      OT LONG TERM GOAL #4   Title Pt. will perform LE dressing with minA    Baseline MaxA    Time 12    Period Weeks    Status New    Target Date 01/20/22      OT LONG TERM GOAL #5   Title Pt. will improve FOTO score by 2 pt. to reflect funcational change    Baseline Eval: FOTO score: 12    Time 12    Period Weeks    Status New    Target Date 01/20/22      Long Term Additional Goals   Additional Long Term Goals Yes               Plan - 11/09/21 0823     Clinical Impression Statement Good tolerance to PROM throughout RUE this day.  Fxl transfers performed with min-mod A, cues for sequencing, hand placement, foot placement, forward weight shifting during transfer.  Min A to stabilize for activity that simulated hiking/lowering pants in standing.  Pt will continue to benefit from skilled OT to work towards improving functional transfers, ADL performance, and dynamic standing in order to maximize indep with ADLs and reduce physical burden of care on family.    OT Occupational Profile and History Detailed Assessment- Review of Records and additional review of physical, cognitive, psychosocial history related to current functional performance    Occupational performance deficits (Please refer to evaluation for details): Leisure;IADL's;ADL's    Body Structure / Function / Physical Skills ADL;IADL;FMC;ROM;UE functional use;Strength;Decreased knowledge of use of  DME;Coordination    Rehab Potential Fair    Clinical Decision Making Several treatment options, min-mod task modification necessary    Comorbidities Affecting Occupational Performance: May have comorbidities impacting occupational performance    Modification or Assistance to Complete Evaluation  Min-Moderate modification of tasks or assist with assess necessary to complete  eval    OT Frequency 2x / week    OT Duration 12 weeks    OT Treatment/Interventions Self-care/ADL training;Neuromuscular education;Electrical Stimulation;Patient/family education;Therapeutic activities;Passive range of motion;Therapist, nutritional;Therapeutic exercise;DME and/or AE instruction;Moist Heat    Consulted and Agree with Plan of Care Patient             Patient will benefit from skilled therapeutic intervention in order to improve the following deficits and impairments:   Body Structure / Function / Physical Skills: ADL, IADL, FMC, ROM, UE functional use, Strength, Decreased knowledge of use of DME, Coordination       Visit Diagnosis: Apraxia  Muscle weakness (generalized)  Other lack of coordination    Problem List Patient Active Problem List   Diagnosis Date Noted   Elevated AST (SGOT) 10/22/2021   Lupus anticoagulant positive 10/22/2021   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)    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[place])    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 [place], [place], [address] Phone: [phone]   Fax:  [phone]  Name: Lindsey [name] MRN: [id number] Date of Birth: [birth year]

## 2021-11-11 ENCOUNTER — Ambulatory Visit: Payer: Medicare Other | Admitting: Speech Pathology

## 2021-11-11 ENCOUNTER — Ambulatory Visit: Payer: Medicare Other | Admitting: Occupational Therapy

## 2021-11-11 ENCOUNTER — Other Ambulatory Visit: Payer: Self-pay

## 2021-11-11 ENCOUNTER — Encounter: Payer: Self-pay | Admitting: Occupational Therapy

## 2021-11-11 DIAGNOSIS — R4701 Aphasia: Secondary | ICD-10-CM

## 2021-11-11 DIAGNOSIS — R278 Other lack of coordination: Secondary | ICD-10-CM

## 2021-11-11 DIAGNOSIS — R482 Apraxia: Secondary | ICD-10-CM

## 2021-11-11 DIAGNOSIS — M6281 Muscle weakness (generalized): Secondary | ICD-10-CM

## 2021-11-11 DIAGNOSIS — I63512 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 middle cerebral artery: Secondary | ICD-10-CM

## 2021-11-11 NOTE — Patient Instructions (Signed)
Continue use of SGD at home for improved selection and increased expression wants/needs in home environment.

## 2021-11-11 NOTE — Therapy (Signed)
[place] MAIN [facility] SERVICES [address] [place], [place], [address] Phone: [phone]   Fax:  [phone]  Occupational Therapy Treatment  Patient Details  Name: Lindsey [name] MRN: [id number] Date of Birth: [birth year] Referring Provider (OT): Dr. 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 No pertinent surgical history.  There were no vitals filed for this visit.   Subjective Assessment - 11/11/21 1628     Subjective  Grandson present today with pt and able to answer questions as needed.    Patient is accompanied by: Family member    Pertinent History Pt. is a 66 y.o. 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 Pt. has Right sided weakness, and receptive, and expressive aphasia. Pt. has had a previous CVA with right sided weakness in July 2021. PMHx includes: AFib, Renal Artery thrombosis, situational depression, HTN, Pyelnephritis, Seizure activity, COnstipation, AKI. Vitamin D deficiency, and urinary incontinence/    Currently in Pain? No/denies            OT TREATMENT    Manual  Therapy:   Pt. Tolerated right scapular mobilizations through elevation, depression, abduction/rotation to normalize tone. Pt. Tolerated soft tissue mobilizations radial on ulna for supination, and metacarpal spread stretches. Manual therapy was performed independent of, and in preparation for there. Ex.  Neuro muscular re-education:  Pt. performed weightshifting  right to left in standing. Pt. worked on standing balance skills while reaching with the LUE for [place] rings, and moving them through 4 horizontal rungs of  progressively increasing heights. Pt. tolerated bilateral UE alternating rowing with step-by-step verbal cues to normalize tone, and prepare the RUE for ROM.  Therapeutic Exercise:   Pt. tolerated PROM in all joint ranges of the RUE. Pt. tolerated shoulder flexion, abduction, horizontal abduction, elbow flexion, and extension, forearm supination, and pronation, wrist extension, digit MP, PIP, and DIP flexion, extension, and thumb radial, and palmar abduction. Pt. performed self-ROM with verbal cues, and cues for visual demonstration. Pt. Worked on performing hand to face patterns with hand over hand assist with the RUE  Selfcare:  Pt. worked on Advertising account executive tasks in standing. Pt. Worked on performing, and resistive hiking motion with left UE. Pt. required MinA for standing balance, verbal cues, and cues for visual demonstration.  Pt. worked on reaching down to simulate bringing clothing over her feet with modA over the RLE.   Pt. has her communication  board present today. Pt. is making progress with standing tasks. Pt. is improving with simulated clothing negotiation tasks, and simulated LE dressing. Pt. requires verbal cues, and cues for visual demonstration of each step of each task, and each step during transfers. Pt. continues to work on improving RUE ROM, and increase engagement of the RUE during ADL, and IADL tasks.                       OT  Education - 11/11/21 1629     Education Details fxl transfers    Person(s) Educated Patient;Other (comment)    Methods Explanation;Demonstration;Tactile cues    Comprehension Returned demonstration;Verbal cues required;Need further instruction;Tactile cues required              OT Short Term Goals - 10/28/21 1054       OT SHORT TERM GOAL #1   Title Pt and caregiver will be independent with HEP for RUE    Baseline To be provided    Time 6    Period Weeks    Status New    Target Date 12/09/21               OT Long Term Goals - 10/28/21 1055       OT LONG TERM GOAL #1   Title Pt. will engage engage the RUE as a gross assist/atabilizer during ADLs 100% of the time    Baseline Eval: pt. does not currently engage her RUE during ADLs    Time 12    Period Weeks    Status New    Target Date 01/20/22      OT LONG TERM GOAL #2   Title Pt. will perform UE dressing using one armed dressing techniques    Baseline Eval: MaxA    Time 12    Period Weeks    Status New    Target Date 01/20/22      OT LONG TERM GOAL #3   Title Pt. will require minA to stand and hike pants    Baseline Eval: MaxA    Time 12    Period Weeks    Status New    Target Date 01/20/22      OT LONG TERM GOAL #4   Title Pt. will perform LE dressing with minA    Baseline MaxA    Time 12    Period Weeks    Status New    Target Date 01/20/22      OT LONG TERM GOAL #5   Title Pt. will improve FOTO score by 2 pt. to reflect funcational change    Baseline Eval: FOTO score: 12    Time 12    Period Weeks    Status New    Target Date 01/20/22      Long Term Additional Goals   Additional Long Term Goals Yes                   Plan - 11/11/21 1629     Clinical Impression Statement Pt. has her communication board present today. Pt. is making progress with standing tasks. Pt. is improving with simulated clothing negotiation tasks, and simulated LE dressing. Pt. requires verbal cues, and  cues for visual demonstration of each step of each task, and each step during transfers. Pt. continues to work on improving RUE ROM, and increase engagement of the RUE during ADL, and IADL tasks.     OT Occupational Profile and History Detailed  Assessment- Review of Records and additional review of physical, cognitive, psychosocial history related to current functional performance    Occupational performance deficits (Please refer to evaluation for details): Leisure;IADL's;ADL's    Body Structure / Function / Physical Skills ADL;IADL;FMC;ROM;UE functional use;Strength;Decreased knowledge of use of DME;Coordination    Rehab Potential Fair    Clinical Decision Making Several treatment options, min-mod task modification necessary    Comorbidities Affecting Occupational Performance: May have comorbidities impacting occupational performance    Modification or Assistance to Complete Evaluation  Min-Moderate modification of tasks or assist with assess necessary to complete eval    OT Frequency 2x / week    OT Duration 12 weeks    OT Treatment/Interventions Self-care/ADL training;Neuromuscular education;Electrical Stimulation;Patient/family education;Therapeutic activities;Passive range of motion;Therapist, nutritional;Therapeutic exercise;DME and/or AE instruction;Moist Heat    Consulted and Agree with Plan of Care Patient             Patient will benefit from skilled therapeutic intervention in order to improve the following deficits and impairments:   Body Structure / Function / Physical Skills: ADL, IADL, FMC, ROM, UE functional use, Strength, Decreased knowledge of use of DME, Coordination       Visit Diagnosis: Muscle weakness (generalized)  Other lack of coordination    Problem List Patient Active Problem List   Diagnosis Date Noted   Elevated AST (SGOT) 10/22/2021   Lupus anticoagulant positive 10/22/2021   Combined receptive and expressive aphasia as late effect of  cerebrovascular accident (CVA)    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[place])    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 ([place]) 09/24/2021   Cerebral embolism with cerebral infarction 09/23/2021   Pyelonephritis 09/19/2021   Pain due to onychomycosis of toenails of both feet 11/19/2020   Normocytic anemia 05/31/2020   Acute ischemic left MCA stroke ([place]) 04/30/2020   Right hemiplegia ([place]) 04/30/2020   Cerebrovascular accident ([place]) 04/21/2020   Atrial fibrillation with RVR ([place]) 04/21/2020   Essential hypertension 04/21/2020   Alcohol abuse 04/21/2020   Harrel Carina, MS, OTR/L   Harrel Carina, OT 11/11/2021, 4:33 PM  [place] MAIN [facility] SERVICES [address] [place], [place], [address] Phone: [phone]   Fax:  [phone]  Name: Lindsey [name] MRN: [id number] Date of Birth: October 02, 1956

## 2021-11-11 NOTE — Therapy (Signed)
[place] MAIN [facility] SERVICES [address] [place], [place], [address] Phone: [phone]   Fax:  [phone]  Speech Language Pathology Treatment  Patient Details  Name: Lindsey [name] MRN: [id number] Date of Birth: Apr 17, 1956 Referring Provider (SLP): Dr. 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  There were no vitals filed for this visit.   Subjective Assessment - 11/11/21 1112     Subjective Patient presents in [facility], motivated and pleasantly engaged for session; [name] with patient    Currently in Pain? No/denies                   ADULT SLP TREATMENT - 11/11/21 0001       General Information   Behavior/Cognition Alert;Cooperative;Pleasant mood    HPI Patient is a 66 y.o. female with prior history of CVA in 2021 with residual aphasia (using Lingraphica AAC device to assist with communication as of 08/2021) referred by Dr. Ranell Patrick after recent hospital admission when she was found to have new L AICA infarct complicated by pyelonephritis. Admitted to [facility] and then CIR 12/18-12/30/22. MRI 09/22/21 showed acute moderate size L AICA cerebellar infarct, underlying extensive chronic left MCA encephalomalacia related to 2021  infarct, left brainstem Wallerian degeneration and chronic left PICA cerebellar infarcts.      Treatment Provided   Treatment provided Cognitive-Linquistic      Cognitive-Linquistic Treatment   Treatment focused on Aphasia;Apraxia    Skilled Treatment Skilled intervention targeted object identification, written word identification, and auditory comprehension with appropriate icon selection for increased multimodal communication via speech generating device (lingraphica). Patient required hand over hand assistance for page scrolling on device for new F3 icons faded to min verbal assist with questions. Patient scanned and selected SLP requested item F;3 with 80% accuracy x10, improved to 100% with f2. Patient continued to improve with subsequent selection of items from a question format with 100% accuracy x5. Patient scanned and selected home icon to return to the main page for increased indpendence navigating SGD with 80% accuracy x5 though notably error was initial attempt only indicating improved recall of icon funciton with continued practice of task. Patient demonstrated word level auditory comprehension with selection of a picture in F3 with 70% accuracy x10 improved to 90% accuracy when narrowed to f2. Utilized same objects for written and auditory comprehension with selection of written word in F3 with 90% acc x10 IND. Again utilizng same x5 words from prior activities ([place] kit notecards), patient demonstrated verbal approximation of single syllable words x5 each  with simultaneous production faded to minimal visual cue via lips/mouthing.      Assessment / Recommendations / Plan   Plan Continue with current plan of care              SLP Education - 11/11/21 1128     Education Details Progress! Confidence with device, doing well with modified fields    Person(s) Educated Patient    Methods Explanation;Demonstration    Comprehension Verbalized understanding;Need further instruction               SLP Short Term Goals - 10/28/21 1739       SLP SHORT TERM GOAL #1   Title Pt will answer personally relevant Y/N questions in context using multimodal communication or AAC if necessary, 75% accuracy.    Time 10    Period --   sessions   Status New      SLP SHORT TERM GOAL #2   Title Given verbal or visual prompt, pt will correctly ID written word/object/icon in field of 4-6 to communicate wants/needs with 75% accuracy given occasional mod A over 2 sessions    Time 10    Period --   visits   Status New      SLP SHORT TERM GOAL #3   Title Pt will verbally approximate 3 significant words for 5/10 trials given occasional fading to rare mod A over 2 sessions    Time 10    Period --   sessions   Status New              SLP Long Term Goals - 10/28/21 1742       SLP LONG TERM GOAL #1   Title Pt will use multimodal communication system with mod assist to express wants/needs given >2 choices over 2 sessions for 4/5 opportunities    Time 12    Period Weeks    Status New    Target Date 01/26/22      SLP LONG TERM GOAL #2   Title Caregiver will demonstrate appropriate use of supported conversation and use of multimodal supports to aid pt's comprehension of personally relevant topics, over 4 sessions.    Time 12    Period Weeks    Status New    Target Date 01/26/22      SLP LONG TERM GOAL #3   Title Caregiver will appropriately cue patient when apraxic/aphasic errors occurs for 4/5 opportunities over 2 sessions    Time 12    Period Weeks    Status New    Target Date 01/26/22              Plan - 11/11/21 1129     Clinical Impression Statement Lindsey [name] presents with severe aphasia and severe verbal apraxia, chronic deficits exacerbated by new CVA in December 2022. Prior to this, pt was using Lingraphica to communicate wants/needs with family assistance. Pt now presenting with severe apraxia and nonfluent aphasia, although auditory comprehension is better than  verbal expression. Patient was engaged in session today and demonstrated improved AAC use via Lingraphica with modified level of complexity established last week. Patient demonstrated improved navigation through device this session and good approximation of single words with focused simultaneous production faded to visual cues only though patient remains challenged by alternating words/names. I continue to recommend skilled ST with focus on functional communication to improve ability to communicate wants/needs, reduce frustration/social isolation and improve quality of life.    Speech Therapy Frequency 2x / week  Duration 12 weeks    Treatment/Interventions Compensatory strategies;Patient/family education;Functional tasks;Cueing hierarchy;Environmental controls;Cognitive reorganization;Multimodal communcation approach;Language facilitation;Compensatory techniques;Internal/external aids;SLP instruction and feedback;Other (comment)    Potential to Achieve Goals Fair    Potential Considerations Severity of impairments;Previous level of function    SLP Home Exercise Plan bring Lingraphica to next session    Consulted and Agree with Plan of Care Patient;Family member/caregiver    Family Member Consulted daughter             Patient will benefit from skilled therapeutic intervention in order to improve the following deficits and impairments:   Apraxia  Aphasia  Acute ischemic left MCA stroke [facility])    Problem List Patient Active Problem List   Diagnosis Date Noted   Elevated AST (SGOT) 10/22/2021   Lupus anticoagulant positive 10/22/2021   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)    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 Kortny Lirette, M.A. [place] [phone]  [place], [place] 11/11/2021, 11:34 AM  [place] MAIN [facility] SERVICES [address] [place], [place], [address] Phone: [phone]   Fax:  [phone]   Name: Emilianna Barlowe MRN: [id number] Date of Birth: 24-Jun-1956

## 2021-11-15 ENCOUNTER — Other Ambulatory Visit: Payer: Self-pay

## 2021-11-15 ENCOUNTER — Encounter: Payer: Self-pay | Admitting: Physical Medicine and Rehabilitation

## 2021-11-15 ENCOUNTER — Encounter
Payer: Medicare Other | Attending: Physical Medicine and Rehabilitation | Admitting: Physical Medicine and Rehabilitation

## 2021-11-15 VITALS — BP 130/82 | HR 80 | Temp 98.2°F | Ht 67.0 in | Wt 130.0 lb

## 2021-11-15 DIAGNOSIS — R252 Cramp and spasm: Secondary | ICD-10-CM | POA: Diagnosis not present

## 2021-11-15 DIAGNOSIS — I69398 Other sequelae of cerebral infarction: Secondary | ICD-10-CM | POA: Insufficient documentation

## 2021-11-15 MED ORDER — MEGESTROL ACETATE 400 MG/10ML PO SUSP: 400.0000 mg | Freq: Every day | ORAL | 0 refills | Status: DC

## 2021-11-15 NOTE — Progress Notes (Signed)
Botox Injection for spasticity, palpation and activation approach used for localization since ultrasound machine was not working   Dilution: 2:1 Indication: Severe spasticity which interferes with ADL,mobility and/or  hygiene and is unresponsive to medication management and other conservative care   Informed consent was obtained after describing risks and benefits of the procedure with the patient. This includes bleeding, bruising, infection, excessive weakness, or medication side effects. A REMS form is on file and signed.  1) Pectoralis: 50U 2) Flexor digitorum profundus: 50U in 1 site 3) Flexor carpi radialis: 50U U in 1 site 4) Flexor digitorum superficialis: 50U in 1 site 5) Pronator quadratus: 50U 6) Pronator teres: 50U     All injections were done after palpation and patient activation of the muscle confirmed the accurate location, and after negative drawback for blood. The patient tolerated the procedure well. Post procedure instructions were given. A followup appointment was made.

## 2021-11-16 ENCOUNTER — Encounter: Payer: Self-pay | Admitting: Physical Therapy

## 2021-11-16 ENCOUNTER — Encounter: Payer: Self-pay | Admitting: Occupational Therapy

## 2021-11-16 ENCOUNTER — Ambulatory Visit: Payer: Medicare Other | Admitting: Physical Therapy

## 2021-11-16 ENCOUNTER — Ambulatory Visit: Payer: Medicare Other | Admitting: Occupational Therapy

## 2021-11-16 DIAGNOSIS — M6281 Muscle weakness (generalized): Secondary | ICD-10-CM | POA: Diagnosis not present

## 2021-11-16 DIAGNOSIS — R269 Unspecified abnormalities of gait and mobility: Secondary | ICD-10-CM

## 2021-11-16 DIAGNOSIS — R262 Difficulty in walking, not elsewhere classified: Secondary | ICD-10-CM

## 2021-11-16 DIAGNOSIS — R278 Other lack of coordination: Secondary | ICD-10-CM

## 2021-11-16 DIAGNOSIS — R2689 Other abnormalities of gait and mobility: Secondary | ICD-10-CM

## 2021-11-16 NOTE — Therapy (Signed)
[place] MAIN [facility] SERVICES [address] [place], [place], [address] Phone: [phone]   Fax:  [phone]  Occupational Therapy Treatment  Patient Details  Name: Lindsey [name] MRN: [id number] Date of Birth: 07/14/1956 Referring Provider (OT): Dr. 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 No pertinent surgical history.  There were no vitals filed for this visit.   Subjective Assessment - 11/16/21 1149     Subjective  Pt. was present with her daughter    Patient is accompanied by: Family member    Pertinent History Pt. is a 66 y.o. 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 Pt. has Right sided weakness, and receptive, and expressive aphasia. Pt. has had a previous CVA with right sided weakness in July 2021. PMHx includes: AFib, Renal Artery thrombosis, situational depression, HTN, Pyelnephritis, Seizure activity, COnstipation, AKI. Vitamin D deficiency, and urinary incontinence/    Currently in Pain? No/denies            OT TREATMENT     Therapeutic Exercise:   Pt. tolerated PROM in  all joint ranges of the RUE. Pt. tolerated shoulder flexion, abduction, horizontal abduction, elbow flexion, and extension, forearm supination, and pronation, wrist extension, digit MP, PIP, and DIP flexion, extension, and thumb radial, and palmar abduction. Pt. performed self-ROM with verbal cues, and cues for visual demonstration. Pt. Worked on performing hand to face patterns with hand over hand assist with the RUE.   Manual Therapy:   Pt. tolerated scapular mobilizations through elevation, depression, abduction/rotation to normalize tone. Manual therapy was performed independent of, and in preparation for therapeutic Ex., and ROM.   Pt. had Botox injections to the RUE yesterday. Pt. tolerated ROM well. The right resting hand splint fit was assessed.  The old padding was removed, and new padding was applied. Pt. Tolerated the fit assessment well afte all the padding was replaced. Pt. continues to work on normalizing tone, and facilitating functional volitional movement in the RUE in order to assist with self dressing, ADLs, and transfers. Pt. Continues to work on improving overall function, and maximizing independence with ADLs, and IADL functioning.                       OT Education - 11/16/21 1149     Education Details splint care, and RUE functioning    Person(s) Educated Patient;Other (comment)    Methods Explanation;Demonstration;Tactile  cues    Comprehension Returned demonstration;Verbal cues required;Need further instruction;Tactile cues required              OT Short Term Goals - 10/28/21 1054       OT SHORT TERM GOAL #1   Title Pt and caregiver will be independent with HEP for RUE    Baseline To be provided    Time 6    Period Weeks    Status New    Target Date 12/09/21               OT Long Term Goals - 10/28/21 1055       OT LONG TERM GOAL #1   Title Pt. will engage engage the RUE as a gross assist/atabilizer during ADLs 100% of the time     Baseline Eval: pt. does not currently engage her RUE during ADLs    Time 12    Period Weeks    Status New    Target Date 01/20/22      OT LONG TERM GOAL #2   Title Pt. will perform UE dressing using one armed dressing techniques    Baseline Eval: MaxA    Time 12    Period Weeks    Status New    Target Date 01/20/22      OT LONG TERM GOAL #3   Title Pt. will require minA to stand and hike pants    Baseline Eval: MaxA    Time 12    Period Weeks    Status New    Target Date 01/20/22      OT LONG TERM GOAL #4   Title Pt. will perform LE dressing with minA    Baseline MaxA    Time 12    Period Weeks    Status New    Target Date 01/20/22      OT LONG TERM GOAL #5   Title Pt. will improve FOTO score by 2 pt. to reflect funcational change    Baseline Eval: FOTO score: 12    Time 12    Period Weeks    Status New    Target Date 01/20/22      Long Term Additional Goals   Additional Long Term Goals Yes                   Plan - 11/16/21 1150     Clinical Impression Statement Pt. had Botox injections to the RUE yesterday. Pt. tolerated ROM well. The right resting hand splint fit was assessed.  The old padding was removed, and new padding was applied. Pt. Tolerated the fit assessment well afte all the padding was replaced. Pt. continues to work on normalizing tone, and facilitating functional volitional movement in the RUE in order to assist with self dressing, ADLs, and transfers. Pt. Continues to work on improving overall function, and maximizing independence with ADLs, and IADL functioning.     OT Occupational Profile and History Detailed Assessment- Review of Records and additional review of physical, cognitive, psychosocial history related to current functional performance    Occupational performance deficits (Please refer to evaluation for details): Leisure;IADL's;ADL's    Body Structure / Function / Physical Skills ADL;IADL;FMC;ROM;UE functional  use;Strength;Decreased knowledge of use of DME;Coordination    Rehab Potential Fair    Clinical Decision Making Several treatment options, min-mod task modification necessary    Comorbidities Affecting Occupational Performance: May have comorbidities impacting occupational performance    Modification or Assistance to Complete Evaluation  Min-Moderate modification of tasks or assist with assess necessary to complete eval    OT Frequency 2x / week    OT Duration 12 weeks    OT Treatment/Interventions Self-care/ADL training;Neuromuscular education;Electrical Stimulation;Patient/family education;Therapeutic activities;Passive range of motion;Therapist, nutritional;Therapeutic exercise;DME and/or AE instruction;Moist Heat    Consulted and Agree with Plan of Care Patient             Patient will benefit from skilled therapeutic intervention in order to improve the following deficits and impairments:   Body Structure / Function / Physical Skills: ADL, IADL, FMC, ROM, UE functional use, Strength, Decreased knowledge of use of DME, Coordination       Visit Diagnosis: Muscle weakness (generalized)  Other lack of coordination    Problem List Patient Active Problem List   Diagnosis Date Noted   Elevated AST (SGOT) 10/22/2021   Lupus anticoagulant positive 10/22/2021   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)    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[place])    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 ([place]) 09/24/2021   Cerebral embolism with cerebral infarction 09/23/2021   Pyelonephritis 09/19/2021   Pain due to onychomycosis of toenails of both feet 11/19/2020   Normocytic anemia 05/31/2020   Acute ischemic left MCA stroke ([place]) 04/30/2020   Right hemiplegia ([place]) 04/30/2020   Cerebrovascular accident ([place]) 04/21/2020   Atrial fibrillation with RVR ([place]) 04/21/2020   Essential hypertension  04/21/2020   Alcohol abuse 04/21/2020   Harrel Carina, MS, OTR/L   Harrel Carina, OT 11/16/2021, 11:52 AM  [place] [address] [place], [place], [address] Phone: [phone]   Fax:  [phone]  Name: Brehan Mcgivney MRN: [id number] Date of Birth: [birth year]

## 2021-11-16 NOTE — Therapy (Signed)
[place] MAIN [facility] SERVICES [place], [place], [address] Phone: [phone]   Fax:  [phone]  Physical Therapy Treatment  Patient Details  Name: Lindsey [name] MRN: [id number] Date of Birth: Sep 28, 1956 Referring Provider (PT): Ranell Patrick Clide Deutscher, MD   Encounter Date: 11/16/2021   PT End of Session - 11/16/21 1156     Visit Number 1    Number of Visits 24    Date for PT Re-Evaluation 02/08/22    PT Start Time 0932    PT Stop Time 1030    PT Time Calculation (min) 58 min    Equipment Utilized During Treatment Gait belt    Activity Tolerance Patient tolerated treatment well    Behavior During Therapy WFL for tasks assessed/performed             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 No pertinent surgical history.  There were no vitals filed for this visit.   Subjective Assessment - 11/16/21 0936     Subjective Pt was doing very well and was walking short distances wqith a cane prior to second stroke in december. At this point she is limited to wheelchair as her main means of mobility. Pt caregiver reports all detailed information. Pt has hesitation with the left lower extremity and significant weakness on the right lower extremity. Pt will take 2-3 steps when transfering. Pt caregiver assists with all transfers.Pt has shower with ledge to steps over ledge when entering and utilizes bedside commode. Pt has 2 level home with chair lift for upstairs access. Pt would like to improve ransfers, improve neglect on her R side and improve her overall strength and mobility.     Patient is accompained by: Family member   [name] ( DTR)   Pertinent History Pt was doing very well and was walking short distances wqith a cane prior to second stroke in december. At this point she is limited to wheelchair as her main means of mobility. Pt caregiver reports all detailed information. Pt has hesitation with the left lower extremity and significant weakness on the right lower extremity. Pt will take 2-3 steps when transfering. Pt caregiver assists with all transfers.Pt has shower with ledge to steps over ledge when entering and utilizes bedside commode. Pt has 2 level home with chair lift for upstairs access. Pt would like to improve ransfers, improve neglect on her R side and improve her overall strength and mobility.Pt. is a 66 y.o. 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 Pt. has Right sided weakness, and receptive, and expressive aphasia. Pt. has had a previous CVA with right sided weakness in July 2021. PMHx includes: AFib, Renal Artery thrombosis, situational depression, HTN, Pyelnephritis, Seizure activity, COnstipation, AKI. Vitamin D deficiency, and urinary incontinence.    Limitations Standing;Walking;House hold activities    How long can you sit comfortably? n/a    How long can you stand comfortably? 2 min    How long can you walk comfortably? 3-4 steps  Patient Stated Goals Improve balance and functional movements    Currently in Pain? No/denies    Pain Score 0-No pain                [facility] PT Assessment - 11/16/21 0001       Assessment   Medical Diagnosis CVA    Referring Provider (PT) Ranell Patrick, Clide Deutscher, MD    Prior Therapy YEs      Precautions   Precautions Fall      Balance Screen   Has the patient fallen in the past 6 months No    Has the patient had a decrease in activity level because of a fear of falling?  Yes    Is the patient reluctant to leave their home because of a  fear of falling?  No      Home Environment   Living Environment Private residence    Living Arrangements Children    Type of [place] entrance    [place] - 2 wheels;Shower seat;Adaptive equipment;Hospital bed;Wheelchair - manual;Bedside commode;Cane - quad      Prior Function   Level of Independence Independent    Vocation Part time employment;Retired    Futures trader, Catering manager Assessment   Standardized Balance Assessment Runner, broadcasting/film/video   Sit to Stand Able to stand using hands after several tries    Standing Unsupported Needs several tries to stand 30 seconds unsupported    Sitting with Back Unsupported but Feet Supported on Floor or Stool Able to sit safely and securely 2 minutes    Stand to Sit Uses backs of legs against chair to control descent    Transfers Needs one person to assist    Standing Unsupported with Eyes Closed Needs help to keep from falling    Standing Unsupported with Feet Together Needs help to attain position and unable to hold for 15 seconds    From Standing, Reach Forward with Outstretched Arm Reaches forward but needs supervision    From Standing Position, Pick up Object from Floor Unable to try/needs assist to keep balance    From Standing Position, Turn to Look Behind Over each Shoulder Needs supervision when turning    Turn 360 Degrees Needs assistance while turning    Standing Unsupported, Alternately Place Feet on Step/Stool Needs assistance to keep from falling or unable to try    Standing Unsupported, One Foot in [employer] balance while stepping or standing    Standing on One Leg Unable to try or needs assist to prevent fall    Total Score 12               STRENGTH:  Graded on a 0-5 scale Muscle Group Left Right  Shoulder flex    Shoulder Abd    Shoulder Ext    Shoulder IR/ER    Elbow    Wrist/hand    Hip Flex 3 4+   Hip Abd 4- 4+  Hip Add 3+ 4+  Hip Ext    Hip IR/ER    Knee Flex 3+ 4  Knee Ext 4- 4  Ankle DF  5  Ankle PF  5       COORDINATION:         Heel Shin Slide Test:, mild coordination deficits, unable to perform through full ROM  BALANCE: Static Sitting Balance  Normal Able to maintain balance against maximal resistance x  Good Able to maintain balance against moderate resistance   Good-/Fair+ Accepts minimal resistance   Fair Able to sit unsupported without balance loss and without UE support   Poor+ Able to maintain with Minimal assistance from individual or chair   Poor Unable to maintain balance-requires mod/max support from individual or chair    Static Standing Balance  Normal Able to maintain standing balance against maximal resistance   Good Able to maintain standing balance against moderate resistance   Good-/Fair+ Able to maintain standing balance against minimal resistance   Fair Able to stand unsupported without UE support and without LOB for 1-2 min   Fair- Requires Min A and UE support to maintain standing without loss of balance x  Poor+ Requires mod A and UE support to maintain standing without loss of balance   Poor Requires max A and UE support to maintain standing balance without loss     GAIT:  OUTCOME MEASURES: TEST Outcome Interpretation  5 times sit<>stand 58.16sec >36 yo, >15 sec indicates increased risk for falls              Berg Balance Assessment 12 <36/56 (100% risk for falls), 37-45 (80% risk for falls); 46-51 (>50% risk for falls); 52-55 (lower risk <25% of falls)  FOTO 17                            PT Education - 11/16/21 1203     Education Details POC    Person(s) Educated Patient;Child(ren)    Methods Explanation    Comprehension Verbalized understanding;Verbal cues required              PT Short Term Goals - 11/16/21 1207       PT SHORT TERM GOAL #1   Title Patient will be independent in home  exercise program to improve strength/mobility for better functional independence with ADLs.    Baseline no formal HEp for LE/balance at this time    Time 4    Period Weeks    Status New    Target Date 12/14/21               PT Long Term Goals - 11/16/21 1207       PT LONG TERM GOAL #1   Title Patient will increase FOTO score to equal to or greater than   41  to demonstrate statistically significant improvement in mobility and quality of life.    Baseline 16 on 2/14    Time 12    Period Weeks    Status New    Target Date 02/08/22      PT LONG TERM GOAL #3   Title Patient  will complete five times sit to stand test in < 30 seconds with UE assistance indicating an increased LE strength and improved balance.    Baseline 57 s with UE assist and with A from PT for R LE positioning    Time 12    Period Weeks    Status New    Target Date 02/08/22      PT LONG TERM GOAL #4   Title Patient will increase Berg Balance score by > 6 points to demonstrate decreased fall risk during functional activities.    Baseline 12 on 2/14    Time 12    Period Weeks    Status New  Target Date 02/08/22      PT LONG TERM GOAL #5   Title Pt will transfer from seated position on table/chair to/from transport chair/WC in order to indicate improved transfer ability.    Baseline Requires Min A for LE positioning and to prevent LOB.    Time 12    Period Weeks    Status New    Target Date 02/08/22                   Plan - 11/16/21 0947     Clinical Impression Statement Patient is a 67 year old female with history of 2 cerebrovascular accidents resulting in significant right upper and lower extremity weakness in addition to both expressive and receptive aphasia.  Patient presents with significant deficits in her mobility as evidenced by 5 times sit to stand test, lower extremity strength testing, and Berg balance test completed during today's session.  In addition patient has some noted  neglect on the right side specially when attempting to perform standing activities and transfers.  Patient is currently receiving occupational therapy and speech and language pathologist related treatment at this time and presents today for first physical therapy evaluation since her second stroke which occurred in December 2022.  Patient will benefit from skilled physical therapy intervention in order to improve her lower extremity strength, balance, mobility, and improve ability to perform transfers and reduce the burden of her caregivers.    Personal Factors and Comorbidities Age;Comorbidity 1;Comorbidity 2;Comorbidity 3+;Time since onset of injury/illness/exacerbation;Other    Comorbidities AFib, Renal Artery thrombosis, situational depression, HTN, Pyelnephritis, Seizure activity, COnstipation, AKI    Examination-Activity Limitations Bathing;Bed Mobility;Carry;Locomotion Level;Dressing;Sit;Squat;Stairs;Stand;Toileting;Transfers    Examination-Participation Restrictions Church;Community Activity;Laundry    Stability/Clinical Decision Making Evolving/Moderate complexity    Clinical Decision Making Moderate    Rehab Potential Fair    PT Frequency 2x / week    PT Duration 12 weeks    PT Treatment/Interventions ADLs/Self Care Home Management;Gait training;Stair training;Therapeutic activities;Therapeutic exercise;Balance training;Neuromuscular re-education;Patient/family education;Wheelchair mobility training;Manual techniques;Energy conservation;Dry needling;Passive range of motion;Taping;Spinal Manipulations;Joint Manipulations    PT Next Visit Plan HEP    PT Home Exercise Plan To establish next session    Recommended Other Services OT, SLP ( already receiving this care)    Consulted and Agree with Plan of Care Patient             Patient will benefit from skilled therapeutic intervention in order to improve the following deficits and impairments:  Abnormal gait, Decreased activity tolerance,  Decreased endurance, Decreased knowledge of use of DME, Decreased range of motion, Decreased strength, Hypomobility, Impaired flexibility, Difficulty walking, Decreased balance, Decreased coordination, Decreased knowledge of precautions, Decreased mobility, Decreased safety awareness  Visit Diagnosis: Abnormality of gait and mobility  Difficulty in walking, not elsewhere classified  Other abnormalities of gait and mobility     Problem List Patient Active Problem List   Diagnosis Date Noted   Elevated AST (SGOT) 10/22/2021   Lupus anticoagulant positive 10/22/2021   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)    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HCC)    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 ([place]) 09/24/2021   Cerebral embolism with cerebral infarction 09/23/2021   Pyelonephritis 09/19/2021   Pain due to onychomycosis of toenails of both feet 11/19/2020   Normocytic anemia 05/31/2020   Acute ischemic left MCA stroke ([place]) 04/30/2020   Right hemiplegia ([place]) 04/30/2020   Cerebrovascular accident ([place]) 04/21/2020  Atrial fibrillation with RVR ([place]) 04/21/2020   Essential hypertension 04/21/2020   Alcohol abuse 04/21/2020    Particia Lather, PT 11/16/2021, 12:14 PM  [place] MAIN [facility] SERVICES [address] [place], [place], [address] Phone: [phone]   Fax:  [phone]  Name: Shannell Maricle MRN: [id number] Date of Birth: 1956/04/05

## 2021-11-18 ENCOUNTER — Encounter: Payer: Self-pay | Admitting: Physical Therapy

## 2021-11-18 ENCOUNTER — Other Ambulatory Visit: Payer: Self-pay

## 2021-11-18 ENCOUNTER — Ambulatory Visit: Payer: Medicare Other

## 2021-11-18 ENCOUNTER — Other Ambulatory Visit (HOSPITAL_COMMUNITY): Payer: Self-pay

## 2021-11-18 ENCOUNTER — Ambulatory Visit: Payer: Medicare Other | Admitting: Speech Pathology

## 2021-11-18 ENCOUNTER — Ambulatory Visit: Payer: Medicare Other | Admitting: Physical Therapy

## 2021-11-18 DIAGNOSIS — R278 Other lack of coordination: Secondary | ICD-10-CM

## 2021-11-18 DIAGNOSIS — M6281 Muscle weakness (generalized): Secondary | ICD-10-CM

## 2021-11-18 DIAGNOSIS — I63512 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 middle cerebral artery: Secondary | ICD-10-CM

## 2021-11-18 DIAGNOSIS — R262 Difficulty in walking, not elsewhere classified: Secondary | ICD-10-CM

## 2021-11-18 DIAGNOSIS — I63542 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: Secondary | ICD-10-CM

## 2021-11-18 DIAGNOSIS — R269 Unspecified abnormalities of gait and mobility: Secondary | ICD-10-CM

## 2021-11-18 DIAGNOSIS — R482 Apraxia: Secondary | ICD-10-CM

## 2021-11-18 DIAGNOSIS — R4701 Aphasia: Secondary | ICD-10-CM

## 2021-11-18 DIAGNOSIS — R2689 Other abnormalities of gait and mobility: Secondary | ICD-10-CM

## 2021-11-18 NOTE — Therapy (Signed)
[place] MAIN [facility] SERVICES [place], [place], [address] Phone: [phone]   Fax:  [phone]  Physical Therapy Treatment  Patient Details  Name: Lindsey [name] MRN: [id number] Date of Birth: [birth year] Referring Provider (PT): Ranell Patrick Clide Deutscher, MD   Encounter Date: 11/18/2021   PT End of Session - 11/18/21 1453     Visit Number 2    Number of Visits 24    Date for PT Re-Evaluation 02/08/22    PT Start Time 1432    PT Stop Time 1515    PT Time Calculation (min) 43 min    Equipment Utilized During Treatment Gait belt    Activity Tolerance Patient tolerated treatment well    Behavior During Therapy WFL for tasks assessed/performed             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 No pertinent surgical history.  There were no vitals filed for this visit.   Subjective Assessment - 11/18/21 1445     Subjective Pt states she is doing well today. Reports no LOB/falls since evaluation. Denies pain.    Patient is accompained by: Family member   [name] ( DTR)   Pertinent History Pt was doing very well and was walking short distances wqith a cane prior to second stroke in december. At this point she is limited to wheelchair as her main means of mobility. Pt caregiver reports all detailed information. Pt has hesitation with the left lower extremity and significant weakness on the right lower extremity. Pt will take 2-3 steps when transfering. Pt caregiver assists with all transfers.Pt has shower with ledge to steps over ledge when entering and utilizes bedside commode. Pt has  2 level home with chair lift for upstairs access. Pt would like to improve ransfers, improve neglect on her R side and improve her overall strength and mobility.Pt. is a 66 y.o. 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 Pt. has Right sided weakness, and receptive, and expressive aphasia. Pt. has had a previous CVA with right sided weakness in July 2021. PMHx includes: AFib, Renal Artery thrombosis, situational depression, HTN, Pyelnephritis, Seizure activity, COnstipation, AKI. Vitamin D deficiency, and urinary incontinence.    Limitations Standing;Walking;House hold activities    How long can you sit comfortably? n/a    How long can you stand comfortably? 2 min    How long can you walk comfortably? 3-4 steps    Patient Stated Goals Improve balance and functional movements    Currently in Pain? No/denies                INTERVENTIONS  Review and performed HEP, 2 x 10 reps each exercise:  Seated march, multimodal cuing for technique; LAQ with 3 second hold; Hip adduction, performed SL at a time; Heel/toe lifts, alternating; Hip abduction performed BLE; Scapular retraction, multimodal cueing for technique; Patient and grandson verbalize and demonstrate understanding.   Standing at support bar, CGA at all times for safety: - Static standing, 60  seconds. - Marching, alternating with SUE support x2 minutes. Left knee block performed to prevent buckling. Difficulty lifting RLE due to weak hip flexion and difficulty coordinating hip and knee flexion.  - Lateral stepping, alternating 1 step at a time, SUE support 2 x 2 minutes. Increased difficulty stepping left compared to right. Left knee block provided during SLS phase of step and assistance provided at posterior right knee to encourage hip/knee flexion and assist with foot clearance.   STS x5 reps; frequent VC on sequencing, hand placement and posture. CGA at  all times with HHA during standing phase. Right foot block for improved RLE engagement.   MIN-MOD A for stand pivot transfer transport chair<>EOM and transport chair<>standard chair.    Pt educated throughout session about proper posture and technique with exercises. Improved exercise technique, movement at target joints, use of target muscles after min to mod verbal, visual, tactile cues.   Clinical Impression: Pt is pleasant and motivated throughout session. PT encourages seated rest breaks as pt is willing to continue trying however does not verbalize fatigue until asked. She has fair static standing balance on firm surface. Difficulty with weight shifting, especially into LLE due to frequent knee buckling. Difficulty with RLE foot clearance due to difficulty with left SLS, weakness in right hip flexors and impaired coordination. Pt wears solid AFO on RLE. She demonstrates ability to perform HEP although she did have some difficulty with recruiting specific muscle groups such as right hip adductors/abductors and bilateral scapular retractors. Patient will benefit from skilled physical therapy intervention in order to improve her lower extremity strength, balance, mobility, and improve ability to perform transfers and reduce the burden of her caregivers.          PT Short Term Goals - 11/16/21 1207       PT SHORT TERM GOAL #1   Title Patient will be independent in home exercise program to improve strength/mobility for better functional independence with ADLs.    Baseline no formal HEp for LE/balance at this time    Time 4    Period Weeks    Status New    Target Date 12/14/21               PT Long Term Goals - 11/16/21 1207       PT LONG TERM GOAL #1   Title Patient will increase FOTO score to equal to or greater than   41  to demonstrate statistically significant improvement in mobility and quality of life.    Baseline 16 on 2/14    Time 12    Period Weeks    Status New     Target Date 02/08/22      PT LONG TERM GOAL #3   Title Patient  will complete five times sit to stand test in < 30 seconds with UE assistance indicating an increased LE strength and improved balance.    Baseline 58.47 s with UE assist and with A from PT for R LE positioning    Time 12    Period Weeks    Status New    Target Date 02/08/22      PT LONG TERM GOAL #4   Title Patient will increase Berg Balance score by > 6 points to demonstrate decreased fall risk during functional activities.    Baseline 12 on 2/14    Time 12    Period Weeks    Status New    Target Date 02/08/22  PT LONG TERM GOAL #5   Title Pt will transfer from seated position on table/chair to/from transport chair/WC in order to indicate improved transfer ability.    Baseline Requires Min A for LE positioning and to prevent LOB.    Time 12    Period Weeks    Status New    Target Date 02/08/22                   Plan - 11/18/21 1636     Clinical Impression Statement Pt is pleasant and motivated throughout session. PT encourages seated rest breaks as pt is willing to continue trying however does not verbalize fatigue until asked. She has fair static standing balance on firm surface. Difficulty with weight shifting, especially into LLE due to frequent knee buckling. Difficulty with RLE foot clearance due to difficulty with left SLS, weakness in right hip flexors and impaired coordination. Pt wears solid AFO on RLE. She demonstrates ability to perform HEP although she did have some difficulty with recruiting specific muscle groups such as right hip adductors/abductors and bilateral scapular retractors. Patient will benefit from skilled physical therapy intervention in order to improve her lower extremity strength, balance, mobility, and improve ability to perform transfers and reduce the burden of her caregivers.    Personal Factors and Comorbidities Age;Comorbidity 1;Comorbidity 2;Comorbidity 3+;Time since  onset of injury/illness/exacerbation;Other    Comorbidities AFib, Renal Artery thrombosis, situational depression, HTN, Pyelnephritis, Seizure activity, COnstipation, AKI    Examination-Activity Limitations Bathing;Bed Mobility;Carry;Locomotion Level;Dressing;Sit;Squat;Stairs;Stand;Toileting;Transfers    Examination-Participation Restrictions Church;Community Activity;Laundry    Stability/Clinical Decision Making Evolving/Moderate complexity    Rehab Potential Fair    PT Frequency 2x / week    PT Duration 12 weeks    PT Treatment/Interventions ADLs/Self Care Home Management;Gait training;Stair training;Therapeutic activities;Therapeutic exercise;Balance training;Neuromuscular re-education;Patient/family education;Wheelchair mobility training;Manual techniques;Energy conservation;Dry needling;Passive range of motion;Taping;Spinal Manipulations;Joint Manipulations    PT Next Visit Plan HEP    PT Home Exercise Plan HEP: general LE and postural exercises    Consulted and Agree with Plan of Care Patient             Patient will benefit from skilled therapeutic intervention in order to improve the following deficits and impairments:  Abnormal gait, Decreased activity tolerance, Decreased endurance, Decreased knowledge of use of DME, Decreased range of motion, Decreased strength, Hypomobility, Impaired flexibility, Difficulty walking, Decreased balance, Decreased coordination, Decreased knowledge of precautions, Decreased mobility, Decreased safety awareness  Visit Diagnosis: Difficulty in walking, not elsewhere classified  Other abnormalities of gait and mobility  Abnormality of gait and mobility  Apraxia  Other lack of coordination  Muscle weakness (generalized)  Acute ischemic left MCA stroke (HCC)  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 [facility])     Problem List Patient Active Problem List   Diagnosis Date Noted   Elevated AST  (SGOT) 10/22/2021   Lupus anticoagulant positive 10/22/2021   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)    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[place])    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 ([place]) 09/24/2021   Cerebral embolism with cerebral infarction 09/23/2021   Pyelonephritis 09/19/2021   Pain due to onychomycosis of toenails of both feet 11/19/2020   Normocytic anemia 05/31/2020   Acute ischemic left MCA stroke ([place]) 04/30/2020   Right hemiplegia ([place]) 04/30/2020   Cerebrovascular accident ([place]) 04/21/2020   Atrial fibrillation with RVR ([place]) 04/21/2020   Essential  hypertension 04/21/2020   Alcohol abuse 04/21/2020    Patrina Levering PT, DPT   [place] MAIN [facility] SERVICES [address] [place], [place], [address] Phone: [phone]   Fax:  [phone]  Name: Lindsey [name] MRN: [id number] Date of Birth: 16-Nov-1955

## 2021-11-18 NOTE — Therapy (Signed)
[place] MAIN [facility] SERVICES [place], [place], [address] Phone: [phone]   Fax:  [phone]  Speech Language Pathology Treatment  Patient Details  Name: Lindsey [name] MRN: [id number] Date of Birth: [birth year] Referring Provider (SLP): Dr. 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  There were no vitals filed for this visit.   Subjective Assessment - 11/18/21 1510     Subjective Arrives with grandson    Patient is accompained by: Family member   grandson Lindsey [name]   Currently in Pain? No/denies                   ADULT SLP TREATMENT - 11/18/21 1511       General Information   Behavior/Cognition Alert;Cooperative;Pleasant mood    HPI Patient is a 66 y.o. female with prior history of CVA in 2021 with residual aphasia (using Lingraphica AAC device to assist with communication as of 08/2021) referred by Dr. Ranell Patrick after recent hospital admission when she was found to have new L AICA infarct complicated by pyelonephritis. Admitted to [facility] and then CIR 12/18-12/30/22. MRI 09/22/21 showed acute moderate size L AICA cerebellar infarct, underlying extensive chronic left MCA encephalomalacia related to 2021  infarct, left brainstem Wallerian degeneration and chronic left PICA cerebellar infarcts.      Treatment Provided   Treatment provided Cognitive-Linquistic      Cognitive-Linquistic Treatment   Treatment focused on Aphasia;Apraxia    Skilled Treatment Targeted object ID, auditory comprehension using AAC device. Pt answered simple personal "[place]" questions after familiarization with icons, correctly choosing response from F:3 90% accuracy. Usual tactile assist/demonstration to scroll and activate vs just pointing to targets. Y/N questions 60% accuracy, improved to 80% with simultaneous selection of Y/N on AAC device. SLP provided customization of pt's device with personally relevant images of family members and favorite tv shows; educated pt's grandson on opportunities to use communication device with his grandmother. Pt ID'd picture: spoken word from F:2 90% accuracy. Pt ID'd picture to sentence/simple description from F:3 70% accuracy. Pt repeated answers aloud, approximating 1 syllable words 70% accuracy with written keyword/imitation.      Assessment / Recommendations / Plan   Plan Continue with current plan of care      Progression Toward Goals   Progression toward goals Progressing toward goals              SLP Education - 11/18/21 1628     Education Details when pt points or gestures, encourage her to make her  request using device    Person(s) Educated Patient;Other (comment)   grandson   Methods Explanation    Comprehension Verbalized understanding              SLP Short Term Goals - 10/28/21 1739       SLP SHORT TERM GOAL #1   Title Pt will answer personally relevant Y/N questions in context using multimodal communication or AAC if necessary, 75% accuracy.    Time 10    Period --   sessions   Status New      SLP SHORT TERM GOAL #2   Title Given verbal or visual prompt, pt will correctly ID written word/object/icon in field of 4-6 to communicate wants/needs with 75%  accuracy given occasional mod A over 2 sessions    Time 10    Period --   visits   Status New      SLP SHORT TERM GOAL #3   Title Pt will verbally approximate 3 significant words for 5/10 trials given occasional fading to rare mod A over 2 sessions    Time 10    Period --   sessions   Status New              SLP Long Term Goals - 10/28/21 1742       SLP LONG TERM GOAL #1   Title Pt will use multimodal communication system with mod assist to express wants/needs given >2 choices over 2 sessions for 4/5 opportunities    Time 12    Period Weeks    Status New    Target Date 01/26/22      SLP LONG TERM GOAL #2   Title Caregiver will demonstrate appropriate use of supported conversation and use of multimodal supports to aid pt's comprehension of personally relevant topics, over 4 sessions.    Time 12    Period Weeks    Status New    Target Date 01/26/22      SLP LONG TERM GOAL #3   Title Caregiver will appropriately cue patient when apraxic/aphasic errors occurs for 4/5 opportunities over 2 sessions    Time 12    Period Weeks    Status New    Target Date 01/26/22              Plan - 11/18/21 1630     Clinical Impression Statement Lindsey [name] presents with severe aphasia and severe verbal apraxia, chronic deficits exacerbated by new CVA in December 2022. Prior to this, pt was using Lingraphica to communicate wants/needs with family assistance. Pt now presenting with severe apraxia and nonfluent aphasia, although auditory comprehension is better than verbal expression. Patient remains engaged in sessions and demonstrating ability to answer personal questions using AAC from F:3 today, with cues necessary for activating vs pointing to icons. I continue to recommend skilled ST with focus on functional communication to improve ability to communicate wants/needs, reduce frustration/social isolation and improve quality of life.    Speech Therapy Frequency 2x / week    Duration 12  weeks    Treatment/Interventions Compensatory strategies;Patient/family education;Functional tasks;Cueing hierarchy;Environmental controls;Cognitive reorganization;Multimodal communcation approach;Language facilitation;Compensatory techniques;Internal/external aids;SLP instruction and feedback;Other (comment)    Potential to Achieve Goals Fair    Potential Considerations Severity of impairments;Previous level of function    SLP Home Exercise Plan bring Lingraphica to next session    Consulted and Agree with Plan of Care Patient;Family member/caregiver    Family Member Consulted daughter  Patient will benefit from skilled therapeutic intervention in order to improve the following deficits and impairments:   Aphasia  Verbal apraxia    Problem List Patient Active Problem List   Diagnosis Date Noted   Elevated AST (SGOT) 10/22/2021   Lupus anticoagulant positive 10/22/2021   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)    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[place])    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 ([place]) 09/24/2021   Cerebral embolism with cerebral infarction 09/23/2021   Pyelonephritis 09/19/2021   Pain due to onychomycosis of toenails of both feet 11/19/2020   Normocytic anemia 05/31/2020   Acute ischemic left MCA stroke ([place]) 04/30/2020   Right hemiplegia ([place]) 04/30/2020   Cerebrovascular accident ([place]) 04/21/2020   Atrial fibrillation with RVR ([place]) 04/21/2020   Essential hypertension 04/21/2020   Alcohol abuse 04/21/2020   Deneise Lever, [place], CCC-SLP Speech-Language Pathologist  Aliene Altes, [place] 11/18/2021, 4:32 PM  [place] [address] [place], [place], [address] Phone: [phone]   Fax:  [phone]   Name: Lindsey [name] MRN: [id number] Date of Birth: [birth year]

## 2021-11-18 NOTE — Patient Instructions (Addendum)
[name],   I would like to put a link to [place] on Lindsey [name]'s tablet. If [name] brings her next time, will you write down the password for him so I can put it on her device. Let me know if you don't know it and I can set her up with a new account, but we'll need to use a different email address.  Thanks!  [facility]

## 2021-11-19 ENCOUNTER — Ambulatory Visit: Payer: Medicare Other

## 2021-11-19 MED ORDER — BACLOFEN 5 MG PO TABS: 5.0000 mg | ORAL_TABLET | Freq: Every day | ORAL | 0 refills | Status: DC

## 2021-11-19 MED ORDER — LEVETIRACETAM 500 MG PO TABS: 500.0000 mg | ORAL_TABLET | Freq: Two times a day (BID) | ORAL | 0 refills | Status: DC

## 2021-11-19 MED ORDER — WARFARIN SODIUM 2.5 MG PO TABS
2.5000 mg | ORAL_TABLET | Freq: Every day | ORAL | 0 refills | Status: DC
Start: 2021-11-19 — End: 2022-02-14

## 2021-11-19 MED ORDER — VITAMIN D (ERGOCALCIFEROL) 1.25 MG (50000 UNIT) PO CAPS: 50000.0000 [IU] | ORAL_CAPSULE | ORAL | 0 refills | Status: AC

## 2021-11-19 MED ORDER — SERTRALINE HCL 25 MG PO TABS: 25.0000 mg | ORAL_TABLET | Freq: Every day | ORAL | 0 refills | Status: DC

## 2021-11-19 MED ORDER — METOPROLOL TARTRATE 50 MG PO TABS: 50.0000 mg | ORAL_TABLET | Freq: Two times a day (BID) | ORAL | 0 refills | Status: DC

## 2021-11-19 NOTE — Therapy (Signed)
[place] MAIN [facility] SERVICES [address] [place], [place], [address] Phone: [phone]   Fax:  [phone]  Occupational Therapy Treatment  Patient Details  Name: Lindsey [name] MRN: [id number] Date of Birth: 05/21/1956 Referring Provider (OT): Dr. 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  There were no vitals filed for this visit.   Subjective Assessment - 11/18/21 1253     Subjective  Pt present with grandson today.    Patient is accompanied by: Family member    Pertinent History Pt. is a 66 y.o. 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 Pt. has Right sided weakness, and receptive, and expressive aphasia. Pt. has had a previous CVA with right sided weakness in July 2021. PMHx includes: AFib, Renal Artery thrombosis, situational depression, HTN, Pyelnephritis, Seizure activity, COnstipation, AKI. Vitamin D deficiency, and urinary incontinence/    Limitations Right side weakness, reptive, and expressive ahasia    Patient  Stated Goals To be able to dress herself.    Currently in Pain? No/denies    Pain Score 0-No pain            Occupational Therapy Treatment: Therapeutic Exercise: Functional step pivot transfer completed with mod A today, pt requiring tactile and vc for hand/foot placement and forward lean during ascent.  Pt participated in R shoulder AAROM with utilization of UE ranger with max A for flex/ext, horiz abd/add, and shoulder ER/IR.  Performed passive stretching throughout RUE, including shoulder/elbow/wrist/hand all planes to promote muscle elongation following recent botox injections.  Performed passive shoulder retraction with manual scapular gliding during protraction.  ROM working to increase flexibility throughout RUE to ease ADL performance and reduce contracture risk.   Neuro re-ed: In supine, provided facilitation techniques for promoting muscle activation at shoulder and elbow.  Noted trace movement proximally, but not elbow.  Required min vc to watch RUE during attempts at active movement, and to minimize compensatory shifting from trunk.  Response to Treatment: See Plan/clinical impression below.    OT Education - 11/18/21 1253     Education Details functional transfer technique    Person(s) Educated Patient    Methods Explanation;Demonstration;Tactile cues;Verbal cues    Comprehension Returned demonstration;Verbal cues required;Need further instruction;Tactile cues required              OT Short Term Goals -  10/28/21 1054       OT SHORT TERM GOAL #1   Title Pt and caregiver will be independent with HEP for RUE    Baseline To be provided    Time 6    Period Weeks    Status New    Target Date 12/09/21               OT Long Term Goals - 10/28/21 1055       OT LONG TERM GOAL #1   Title Pt. will engage engage the RUE as a gross assist/atabilizer during ADLs 100% of the time    Baseline Eval: pt. does not currently engage her RUE during ADLs    Time 12     Period Weeks    Status New    Target Date 01/20/22      OT LONG TERM GOAL #2   Title Pt. will perform UE dressing using one armed dressing techniques    Baseline Eval: MaxA    Time 12    Period Weeks    Status New    Target Date 01/20/22      OT LONG TERM GOAL #3   Title Pt. will require minA to stand and hike pants    Baseline Eval: MaxA    Time 12    Period Weeks    Status New    Target Date 01/20/22      OT LONG TERM GOAL #4   Title Pt. will perform LE dressing with minA    Baseline MaxA    Time 12    Period Weeks    Status New    Target Date 01/20/22      OT LONG TERM GOAL #5   Title Pt. will improve FOTO score by 2 pt. to reflect funcational change    Baseline Eval: FOTO score: 12    Time 12    Period Weeks    Status New    Target Date 01/20/22      Long Term Additional Goals   Additional Long Term Goals Yes                   Plan - 11/18/21 1308     Clinical Impression Statement Pt tolerated all exercises well today.  Distal tone appears to have improved with recent botox.  R shoulder abd and ER remain tight and pt will continue to benefit from skilled OT for reducing stiffness in R shoulder joint in order to ease caregiver assist with UB ADLs and reduce potential for pain.    OT Occupational Profile and History Detailed Assessment- Review of Records and additional review of physical, cognitive, psychosocial history related to current functional performance    Occupational performance deficits (Please refer to evaluation for details): Leisure;IADL's;ADL's    Body Structure / Function / Physical Skills ADL;IADL;FMC;ROM;UE functional use;Strength;Decreased knowledge of use of DME;Coordination    Rehab Potential Fair    Clinical Decision Making Several treatment options, min-mod task modification necessary    Comorbidities Affecting Occupational Performance: May have comorbidities impacting occupational performance    Modification or Assistance to  Complete Evaluation  Min-Moderate modification of tasks or assist with assess necessary to complete eval    OT Frequency 2x / week    OT Duration 12 weeks    OT Treatment/Interventions Self-care/ADL training;Neuromuscular education;Electrical Stimulation;Patient/family education;Therapeutic activities;Passive range of motion;Therapist, nutritional;Therapeutic exercise;DME and/or AE instruction;Moist Heat    Consulted and Agree with Plan of Care Patient  Patient will benefit from skilled therapeutic intervention in order to improve the following deficits and impairments:   Body Structure / Function / Physical Skills: ADL, IADL, FMC, ROM, UE functional use, Strength, Decreased knowledge of use of DME, Coordination       Visit Diagnosis: Muscle weakness (generalized)  Apraxia  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 [facility])    Problem List Patient Active Problem List   Diagnosis Date Noted   Elevated AST (SGOT) 10/22/2021   Lupus anticoagulant positive 10/22/2021   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)    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[place])    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 ([place]) 09/24/2021   Cerebral embolism with cerebral infarction 09/23/2021   Pyelonephritis 09/19/2021   Pain due to onychomycosis of toenails of both feet 11/19/2020   Normocytic anemia 05/31/2020   Acute ischemic left MCA stroke ([place]) 04/30/2020   Right hemiplegia ([place]) 04/30/2020   Cerebrovascular accident ([place]) 04/21/2020   Atrial fibrillation with RVR ([place]) 04/21/2020   Essential hypertension 04/21/2020   Alcohol abuse 04/21/2020   Leta Speller, MS, OTR/L  Darleene Cleaver, OT 11/19/2021, 1:08 PM  [place] MAIN [facility] SERVICES [address] [place], [place], [address] Phone: [phone]   Fax:   [phone]  Name: Lindsey [name] MRN: [id number] Date of Birth: 12/28/1955

## 2021-11-19 NOTE — Telephone Encounter (Signed)
Patient's daughter called and stated the patient needs refills on Vitamin D, Baclofen, Keppra, Metoprolol, Sertraline, and Warfarin. She stated she would like a 90 day supply of these medications sent to [facility].

## 2021-11-22 ENCOUNTER — Ambulatory Visit: Payer: Medicare Other | Admitting: Speech Pathology

## 2021-11-22 ENCOUNTER — Other Ambulatory Visit: Payer: Self-pay

## 2021-11-22 ENCOUNTER — Encounter: Payer: Self-pay | Admitting: Occupational Therapy

## 2021-11-22 ENCOUNTER — Ambulatory Visit: Payer: Medicare Other | Admitting: Occupational Therapy

## 2021-11-22 DIAGNOSIS — R482 Apraxia: Secondary | ICD-10-CM

## 2021-11-22 DIAGNOSIS — R4701 Aphasia: Secondary | ICD-10-CM

## 2021-11-22 DIAGNOSIS — R278 Other lack of coordination: Secondary | ICD-10-CM

## 2021-11-22 DIAGNOSIS — M6281 Muscle weakness (generalized): Secondary | ICD-10-CM | POA: Diagnosis not present

## 2021-11-22 NOTE — Therapy (Signed)
[place] MAIN [facility] SERVICES [address] [place], [place], [address] Phone: [phone]   Fax:  [phone]  Occupational Therapy Treatment  Patient Details  Name: Lindsey [name] MRN: [id number] Date of Birth: 07/13/1956 Referring Provider (OT): Dr. 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 No pertinent surgical history.  There were no vitals filed for this visit.   Subjective Assessment - 11/22/21 1022     Subjective  Pt. was late for the session today    Patient is accompanied by: Family member    Pertinent History Pt. is a 66 y.o. 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 Pt. has Right sided weakness, and receptive, and expressive aphasia. Pt. has had a previous CVA with right sided weakness in July 2021. PMHx includes: AFib, Renal Artery thrombosis, situational depression, HTN, Pyelnephritis, Seizure activity, COnstipation, AKI. Vitamin D deficiency, and urinary incontinence/    Currently in Pain? No/denies            OT TREATMENT     Therapeutic Exercise:   Pt. tolerated PROM in  all joint ranges of the RUE. Pt. Tolerated Passive  scapular elevation, depression, abd/adduction/rotation, shoulder flexion, abduction, horizontal abduction, elbow flexion, and extension, forearm supination, and pronation, wrist extension, digit MP, PIP, and DIP flexion, extension, and thumb radial, and palmar abduction. Pt. performed self-ROM with verbal cues, and cues for visual demonstration. Pt. worked on performing hand to face patterns with hand over hand assist with the RUE.   Pt. was late for the session today. Pt. reports that the splint modification/adjustments that were made to the splint padding are comfortable. Pt. continues to present with increased right hand flexion tone, and tightness. Pt. continues to work on normalizing tone, and facilitating functional volitional movement in the RUE in order to assist with self dressing, ADLs, and transfers.                         OT Education - 11/22/21 1022     Education Details RUE ROM    Person(s) Educated Patient    Methods Explanation;Demonstration;Tactile cues;Verbal cues    Comprehension Returned demonstration;Verbal cues required;Need further instruction;Tactile cues required;Verbalized understanding              OT Short Term Goals - 10/28/21 1054       OT SHORT TERM GOAL #1   Title Pt and  caregiver will be independent with HEP for RUE    Baseline To be provided    Time 6    Period Weeks    Status New    Target Date 12/09/21               OT Long Term Goals - 10/28/21 1055       OT LONG TERM GOAL #1   Title Pt. will engage engage the RUE as a gross assist/atabilizer during ADLs 100% of the time    Baseline Eval: pt. does not currently engage her RUE during ADLs    Time 12    Period Weeks    Status New    Target Date 01/20/22      OT LONG TERM GOAL #2   Title Pt. will perform UE dressing using one armed dressing techniques    Baseline Eval: MaxA    Time 12    Period Weeks    Status  New    Target Date 01/20/22      OT LONG TERM GOAL #3   Title Pt. will require minA to stand and hike pants    Baseline Eval: MaxA    Time 12    Period Weeks    Status New    Target Date 01/20/22      OT LONG TERM GOAL #4   Title Pt. will perform LE dressing with minA    Baseline MaxA    Time 12    Period Weeks    Status New    Target Date 01/20/22      OT LONG TERM GOAL #5   Title Pt. will improve FOTO score by 2 pt. to reflect funcational change    Baseline Eval: FOTO score: 12    Time 12    Period Weeks    Status New    Target Date 01/20/22      Long Term Additional Goals   Additional Long Term Goals Yes                   Plan - 11/22/21 1025     Clinical Impression Statement Pt. was late for the session today. Pt. reports that the splint modification/adjustments that were made to the splint padding are comfortable. Pt. continues to present with increased right hand flexion tone, and tightness. Pt. continues to work on normalizing tone, and facilitating functional volitional movement in the RUE in order to assist with self dressing, ADLs, and transfers.    OT Occupational Profile and History Detailed Assessment- Review of Records and additional review of physical, cognitive, psychosocial history related to current functional performance    Occupational performance deficits (Please refer to evaluation for details): Leisure;IADL's;ADL's    Body Structure / Function / Physical Skills ADL;IADL;FMC;ROM;UE functional use;Strength;Decreased knowledge of use of DME;Coordination    Rehab Potential Fair    Clinical Decision Making Several treatment options, min-mod task modification necessary    Comorbidities Affecting Occupational Performance: May have comorbidities impacting occupational performance    Modification or Assistance to Complete Evaluation  Min-Moderate modification of tasks or assist with assess necessary to complete eval    OT Frequency 2x / week    OT  Duration 12 weeks    OT Treatment/Interventions Self-care/ADL training;Neuromuscular education;Electrical Stimulation;Patient/family education;Therapeutic activities;Passive range of motion;Therapist, nutritional;Therapeutic exercise;DME and/or AE instruction;Moist Heat    Consulted and Agree with Plan of Care Patient             Patient will benefit  from skilled therapeutic intervention in order to improve the following deficits and impairments:   Body Structure / Function / Physical Skills: ADL, IADL, FMC, ROM, UE functional use, Strength, Decreased knowledge of use of DME, Coordination       Visit Diagnosis: Other lack of coordination    Problem List Patient Active Problem List   Diagnosis Date Noted   Elevated AST (SGOT) 10/22/2021   Lupus anticoagulant positive 10/22/2021   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)    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[place])    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 ([place]) 09/24/2021   Cerebral embolism with cerebral infarction 09/23/2021   Pyelonephritis 09/19/2021   Pain due to onychomycosis of toenails of both feet 11/19/2020   Normocytic anemia 05/31/2020   Acute ischemic left MCA stroke ([place]) 04/30/2020   Right hemiplegia ([place]) 04/30/2020   Cerebrovascular accident ([place]) 04/21/2020   Atrial fibrillation with RVR ([place]) 04/21/2020   Essential hypertension 04/21/2020   Alcohol abuse 04/21/2020   Harrel Carina, MS, OTR/L   Harrel Carina, OT 11/22/2021, 10:26 AM  [place] MAIN [facility] SERVICES [address] [place], [place], [address] Phone: [phone]   Fax:  [phone]  Name: Lindsey [name] MRN: [id number] Date of Birth: September 29, 1956

## 2021-11-23 ENCOUNTER — Encounter: Payer: Medicare Other | Admitting: Occupational Therapy

## 2021-11-23 ENCOUNTER — Ambulatory Visit: Payer: Medicare Other | Admitting: Physical Therapy

## 2021-11-23 NOTE — Therapy (Signed)
[place] MAIN [facility] SERVICES [place], [place], [address] Phone: [phone]   Fax:  [phone]  Speech Language Pathology Treatment  Patient Details  Name: Lindsey [name] MRN: [id number] Date of Birth: 1956-01-12 Referring Provider (SLP): Dr. 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  There were no vitals filed for this visit.   Subjective Assessment - 11/22/21 1741     Subjective "Yes," pt smiling.    Patient is accompained by: Family member   Daughter [name]   Currently in Pain? No/denies                   ADULT SLP TREATMENT - 11/22/21 1741       General Information   Behavior/Cognition Alert;Cooperative;Pleasant mood    HPI Patient is a 66 y.o. female with prior history of CVA in 2021 with residual aphasia (using Lingraphica AAC device to assist with communication as of 08/2021) referred by Dr. Ranell Patrick after recent hospital admission when she was found to have new L AICA infarct complicated by pyelonephritis. Admitted to [facility] and then CIR 12/18-12/30/22. MRI 09/22/21 showed acute moderate size L AICA cerebellar infarct, underlying extensive chronic left MCA encephalomalacia related to 2021  infarct, left brainstem Wallerian degeneration and chronic left PICA cerebellar infarcts.      Treatment Provided   Treatment provided Cognitive-Linquistic      Cognitive-Linquistic Treatment   Treatment focused on Aphasia;Apraxia    Skilled Treatment Pt's daughter brought iPad with [employer] account, wanting to know appropriate tasks to try with pt at home. Demonstrated functional repetition (word level) and how to cue pt if her attempts are incorrect ("watch me," "say it with me," "say it by yourself,"). Pt accuracy on first attempt 80%, 100% with simultaneous production. Word ID from F:2 60% accuracy with auditory production, increases to 100% with written word. Phrase completion from F:2 80% accuracy with written phrase. Daughter reports wanting pt to initiate use of her device more. SLP explained that using a communication device is an acquired skill and that repeated, consistent use in structured settings to make daily choices/requests is necessary for pt to begin to generalize using this means more independently to make requests. Modeled how to assist pt in making choices at mealtimes; repeated role play x 3; pt required max assist for navigating between categories but made choices from F:3 with min cues.      Assessment / Recommendations / Plan   Plan Continue with current plan of care  Progression Toward Goals   Progression toward goals Progressing toward goals              SLP Education - 11/23/21 0851     Education Details consistent practice with AAC device is necessary in structured settings to train this as a means of making requests/needs known    Person(s) Educated Patient;Child(ren)    Methods Explanation;Demonstration    Comprehension Verbalized understanding              SLP Short Term Goals - 10/28/21 1739       SLP SHORT TERM GOAL #1   Title Pt will answer personally relevant Y/N questions in context using multimodal communication or AAC if  necessary, 75% accuracy.    Time 10    Period --   sessions   Status New      SLP SHORT TERM GOAL #2   Title Given verbal or visual prompt, pt will correctly ID written word/object/icon in field of 4-6 to communicate wants/needs with 75% accuracy given occasional mod A over 2 sessions    Time 10    Period --   visits   Status New      SLP SHORT TERM GOAL #3   Title Pt will verbally approximate 3 significant words for 5/10 trials given occasional fading to rare mod A over 2 sessions    Time 10    Period --   sessions   Status New              SLP Long Term Goals - 10/28/21 1742       SLP LONG TERM GOAL #1   Title Pt will use multimodal communication system with mod assist to express wants/needs given >2 choices over 2 sessions for 4/5 opportunities    Time 12    Period Weeks    Status New    Target Date 01/26/22      SLP LONG TERM GOAL #2   Title Caregiver will demonstrate appropriate use of supported conversation and use of multimodal supports to aid pt's comprehension of personally relevant topics, over 4 sessions.    Time 12    Period Weeks    Status New    Target Date 01/26/22      SLP LONG TERM GOAL #3   Title Caregiver will appropriately cue patient when apraxic/aphasic errors occurs for 4/5 opportunities over 2 sessions    Time 12    Period Weeks    Status New    Target Date 01/26/22              Plan - 11/23/21 0852     Clinical Impression Statement [name] presents with severe aphasia and severe verbal apraxia, chronic deficits exacerbated by new CVA in December 2022. Patient demonstrating improved ability to repeat/imitate functional words and some phrases today, particularly with simultaneous production. Pt demonstrates potential for improving ability to make requests/choices using her AAC device. Demonstrated/role played interactions for mealtime requests and encouraged pt's daughter to schedule and practice choicemaking using AAC daily to promote  generalization. I continue to recommend skilled ST with focus on functional communication to improve ability to communicate wants/needs, reduce frustration/social isolation and improve quality of life.    Speech Therapy Frequency 2x / week    Duration 12 weeks    Treatment/Interventions Compensatory strategies;Patient/family education;Functional tasks;Cueing hierarchy;Environmental controls;Cognitive reorganization;Multimodal communcation approach;Language facilitation;Compensatory techniques;Internal/external aids;SLP instruction and feedback;Other (comment)    Potential to Achieve Goals Fair    Potential Considerations Severity  of impairments;Previous level of function    SLP Home Exercise Plan bring Lingraphica to next session    Consulted and Agree with Plan of Care Patient;Family member/caregiver    Family Member Consulted daughter             Patient will benefit from skilled therapeutic intervention in order to improve the following deficits and impairments:   Aphasia  Verbal apraxia    Problem List Patient Active Problem List   Diagnosis Date Noted   Elevated AST (SGOT) 10/22/2021   Lupus anticoagulant positive 10/22/2021   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)    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[place])    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 [place], [place], [address] Phone: [phone]   Fax:  [phone]   Name: Lindsey [name] MRN: [id number] Date of Birth: 02/27/1956

## 2021-11-25 ENCOUNTER — Ambulatory Visit: Payer: Medicare Other | Admitting: Occupational Therapy

## 2021-11-25 ENCOUNTER — Ambulatory Visit: Payer: Medicare Other

## 2021-11-25 ENCOUNTER — Other Ambulatory Visit: Payer: Self-pay

## 2021-11-25 ENCOUNTER — Ambulatory Visit: Payer: Medicare Other | Admitting: Speech Pathology

## 2021-11-25 ENCOUNTER — Encounter: Payer: Self-pay | Admitting: Occupational Therapy

## 2021-11-25 DIAGNOSIS — M6281 Muscle weakness (generalized): Secondary | ICD-10-CM | POA: Diagnosis not present

## 2021-11-25 DIAGNOSIS — R278 Other lack of coordination: Secondary | ICD-10-CM

## 2021-11-25 DIAGNOSIS — R4701 Aphasia: Secondary | ICD-10-CM

## 2021-11-25 DIAGNOSIS — R482 Apraxia: Secondary | ICD-10-CM

## 2021-11-25 NOTE — Therapy (Signed)
[place] MAIN [facility] SERVICES [place], [place], [address] Phone: [phone]   Fax:  [phone]  Speech Language Pathology Treatment  Patient Details  Name: Lindsey [name] MRN: [id number] Date of Birth: May 03, 1956 Referring Provider (SLP): Dr. 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  There were no vitals filed for this visit.   Subjective Assessment - 11/25/21 1619     Subjective Pt reaches for Lingraphica from grandson    Patient is accompained by: Family member   [name]   Currently in Pain? No/denies                   ADULT SLP TREATMENT - 11/25/21 1622       General Information   Behavior/Cognition Alert;Cooperative;Pleasant mood    HPI Patient is a 66 y.o. female with prior history of CVA in 2021 with residual aphasia (using Lingraphica AAC device to assist with communication as of 08/2021) referred by Dr. Ranell Patrick after recent hospital admission when she was found to have new L AICA infarct complicated by pyelonephritis. Admitted to [facility] and then CIR 12/18-12/30/22. 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      Treatment Provided   Treatment provided Cognitive-Linquistic      Cognitive-Linquistic Treatment   Treatment focused on Aphasia;Apraxia    Skilled Treatment Pt initiating use of Lingraphica device today to select and then repeat functional words and phrases. Pt repeated 100% of her breakfast preferences without simultaneous production necessary "I want eggs/sausage/bacon/pancakes/grits" after activating icon on her device. Min verbal and occasional gestural cues to navigate between sections of her device. Pt activated icons and repeated personal information in response to simple [place] questions (birthday, address, name), with an approximation 60% of the time, improved accuracy with fading cues for apraxia to 90%. Usual mod cues for phone number. Pt participated in selecting images for device customization (favorite music, favorite freezer meals).      Assessment / Recommendations / Plan   Plan Continue with current plan of care      Progression Toward Goals   Progression toward goals Progressing toward goals              SLP Education - 11/25/21 1620     Education Details choose lunch meal each day with AAC    Person(s) Educated Patient;Other (comment)   grandson   Methods  Explanation    Comprehension Verbalized understanding              SLP Short Term Goals - 10/28/21 1739       SLP SHORT TERM GOAL #1   Title Pt will answer personally relevant Y/N questions in context using multimodal communication or AAC if necessary, 75% accuracy.    Time 10    Period --   sessions   Status New      SLP SHORT TERM GOAL #2   Title Given verbal or visual prompt, pt will correctly ID written word/object/icon in field of 4-6 to communicate wants/needs with 75% accuracy given occasional mod A over 2 sessions    Time 10    Period --   visits   Status New      SLP  SHORT TERM GOAL #3   Title Pt will verbally approximate 3 significant words for 5/10 trials given occasional fading to rare mod A over 2 sessions    Time 10    Period --   sessions   Status New              SLP Long Term Goals - 10/28/21 1742       SLP LONG TERM GOAL #1   Title Pt will use multimodal communication system with mod assist to express wants/needs given >2 choices over 2 sessions for 4/5 opportunities    Time 12    Period Weeks    Status New    Target Date 01/26/22      SLP LONG TERM GOAL #2   Title Caregiver will demonstrate appropriate use of supported conversation and use of multimodal supports to aid pt's comprehension of personally relevant topics, over 4 sessions.    Time 12    Period Weeks    Status New    Target Date 01/26/22      SLP LONG TERM GOAL #3   Title Caregiver will appropriately cue patient when apraxic/aphasic errors occurs for 4/5 opportunities over 2 sessions    Time 12    Period Weeks    Status New    Target Date 01/26/22              Plan - 11/25/21 1621     Clinical Impression Statement Lindsey [name] presents with severe aphasia and severe verbal apraxia, chronic deficits exacerbated by new CVA in December 2022. Patient demonstrating improved ability to repeat/imitate today; consistently repeating functional information at phrase level. Pt able to indicate favorite frozen meals by pointing to images on ipad, which were added to her communication device to aid choicemaking. Pt with some responses today at phrase level that were fluent/appropriate (overlearned phrases). I continue to recommend skilled ST with focus on functional communication to improve ability to communicate wants/needs, reduce frustration/social isolation and improve quality of life.    Speech Therapy Frequency 2x / week    Duration 12 weeks    Treatment/Interventions Compensatory strategies;Patient/family education;Functional tasks;Cueing hierarchy;Environmental  controls;Cognitive reorganization;Multimodal communcation approach;Language facilitation;Compensatory techniques;Internal/external aids;SLP instruction and feedback;Other (comment)    Potential to Achieve Goals Fair    Potential Considerations Severity of impairments;Previous level of function    SLP Home Exercise Plan bring Lingraphica to next session    Consulted and Agree with Plan of Care Patient;Family member/caregiver    Family Member Consulted daughter             Patient will benefit from skilled therapeutic intervention in order to improve the following deficits and impairments:  Aphasia  Verbal apraxia    Problem List Patient Active Problem List   Diagnosis Date Noted   Elevated AST (SGOT) 10/22/2021   Lupus anticoagulant positive 10/22/2021   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)    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[place])    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 ([place]) 09/24/2021   Cerebral embolism with cerebral infarction 09/23/2021   Pyelonephritis 09/19/2021   Pain due to onychomycosis of toenails of both feet 11/19/2020   Normocytic anemia 05/31/2020   Acute ischemic left MCA stroke ([place]) 04/30/2020   Right hemiplegia ([place]) 04/30/2020   Cerebrovascular accident ([place]) 04/21/2020   Atrial fibrillation with RVR ([place]) 04/21/2020   Essential hypertension 04/21/2020   Alcohol abuse 04/21/2020   Deneise Lever, MS, CCC-SLP Speech-Language Pathologist [phone]  Aliene Altes, [place] 11/25/2021, 4:31 PM  [place] MAIN [facility] SERVICES [address] [place], [place], [address] Phone: [phone]   Fax:  [phone]   Name: Colita Fenech MRN: [id number] Date of Birth: [birth year]

## 2021-11-25 NOTE — Therapy (Signed)
[place] MAIN [facility] SERVICES [address] [place], [place], [address] Phone: [phone]   Fax:  [phone]  Occupational Therapy Treatment  Patient Details  Name: Lindsey [name] MRN: [id number] Date of Birth: March 22, 1956 Referring Provider (OT): Dr. 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 No pertinent surgical history.  There were no vitals filed for this visit.   Subjective Assessment - 11/25/21 1453     Subjective  Pt. has new items on her communication board.    Patient is accompanied by: Family member    Pertinent History Pt. is a 66 y.o. 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 Pt. has Right sided weakness, and receptive, and expressive aphasia. Pt. has had a previous CVA with right sided weakness in July 2021. PMHx includes: AFib, Renal Artery thrombosis, situational depression, HTN, Pyelnephritis, Seizure activity, COnstipation, AKI. Vitamin D deficiency, and urinary incontinence/    Currently in Pain? No/denies             OT TREATMENT    Self-care:   Pt. worked on simulated LE dressing skills, hiking pants. Pt. Was able to donn/doff the band over her left foot with CGA while sitting, minA to donn/doff it over the right. Pt. required minA to stand, and was able to perform the hiking motion with the left hand with cues for the right side.  Neuromuscular re-education:  Pt. Worked on weightbearing, and proprioception through the right forearm in sidesitting with the addition of slow rocking to normalize tone, and prepare the RUE for ROM. Pt. Worked on bilateral alternating UE rowing to prepare the trunk, and UEs for ROM.    Therapeutic Exercise:   Pt. tolerated PROM in all joint ranges of the RUE. Pt. tolerated shoulder flexion, abduction, horizontal abduction, elbow flexion, and extension, forearm supination, and pronation, wrist extension, digit MP, PIP, and DIP flexion, extension, and thumb radial, and palmar abduction.  Reviewed self-ROM with the pt. For the right hand.   Manual Therapy:   Pt. Tolerated scapular mobilizations through elevation, depression, abduction/rotation to normalize tone. Pt. Tolerated soft tissue mobilizations radial on ulna for supination, and metacarpal spread stretches. Manual therapy was performed independent of, and in preparation for there. Ex.   Pt. was provided with new forearm 2" straps for her hand splint. Pt. is making progress with formulating the movements needed for hiking  LE clothing. Pt. is improving with standing, and transfers requiring less cuing. Pt. required increased verbal, and tactile cues for performing bilateral alternating UE rowing, however was unable to motor plan through the addition of the rocking component. Pt. Continues to work on improving UE strength, and [facility] skills in order to work towards improving, and maximizing independence with ADLs, and IADL tasks.                          OT Education - 11/25/21 1455     Education Details  RUE ROM    Person(s) Educated Patient    Methods Explanation;Demonstration;Tactile cues;Verbal cues    Comprehension Returned demonstration;Verbal cues required;Need further instruction;Tactile cues required;Verbalized understanding              OT Short Term Goals - 10/28/21 1054       OT SHORT TERM GOAL #1   Title Pt and caregiver will be independent with HEP for RUE    Baseline To be provided    Time 6    Period Weeks    Status New    Target Date 12/09/21               OT Long Term Goals - 10/28/21 1055       OT LONG TERM GOAL #1   Title Pt. will engage engage the RUE as a gross assist/atabilizer during ADLs 100% of the time    Baseline Eval: pt. does not currently engage her RUE during ADLs    Time 12    Period Weeks    Status New    Target Date 01/20/22      OT LONG TERM GOAL #2   Title Pt. will perform UE dressing using one armed dressing techniques    Baseline Eval: MaxA    Time 12    Period Weeks    Status New    Target Date 01/20/22      OT LONG TERM GOAL #3   Title Pt. will require minA to stand and hike pants    Baseline Eval: MaxA    Time 12    Period Weeks    Status New    Target Date 01/20/22      OT LONG TERM GOAL #4   Title Pt. will perform LE dressing with minA    Baseline MaxA    Time 12    Period Weeks    Status New    Target Date 01/20/22      OT LONG TERM GOAL #5   Title Pt. will improve FOTO score by 2 pt. to reflect funcational change    Baseline Eval: FOTO score: 12    Time 12    Period Weeks    Status New    Target Date 01/20/22      Long Term Additional Goals   Additional Long Term Goals Yes                   Plan - 11/25/21 1456     Clinical Impression Statement Pt. was provided with new forearm 2" straps for her hand splint. Pt. is making progress with formulating the movements needed for hiking LE clothing. Pt. is improving with standing, and transfers requiring less cuing. Pt. required increased  verbal, and tactile cues for performing bilateral alternating UE rowing, however was unable to motor plan through the addition of the rocking component. Pt. Continues to work on improving UE strength,  and [facility] skills in order to work towards improving, and maximizing independence with ADLs, and IADL tasks.             OT Occupational Profile and History Detailed Assessment- Review of Records and additional review of physical, cognitive, psychosocial history related to current functional performance    Occupational performance deficits (Please refer to evaluation for details): Leisure;IADL's;ADL's    Body Structure / Function / Physical Skills ADL;IADL;FMC;ROM;UE functional use;Strength;Decreased knowledge of use of DME;Coordination    Rehab Potential Fair    Clinical Decision Making Several treatment options, min-mod task modification necessary    Comorbidities Affecting Occupational Performance: May have comorbidities impacting occupational performance    Modification or Assistance to Complete Evaluation  Min-Moderate modification of tasks or assist with assess necessary to complete eval    OT Frequency 2x / week    OT Duration 12 weeks    OT Treatment/Interventions Self-care/ADL training;Neuromuscular education;Electrical Stimulation;Patient/family education;Therapeutic activities;Passive range of motion;Therapist, nutritional;Therapeutic exercise;DME and/or AE instruction;Moist Heat    Consulted and Agree with Plan of Care Patient             Patient will benefit from skilled therapeutic intervention in order to improve the following deficits and impairments:   Body Structure / Function / Physical Skills: ADL, IADL, FMC, ROM, UE functional use, Strength, Decreased knowledge of use of DME, Coordination       Visit Diagnosis: Muscle weakness (generalized)  Other lack of coordination    Problem List Patient Active Problem List   Diagnosis Date Noted   Elevated AST (SGOT)  10/22/2021   Lupus anticoagulant positive 10/22/2021   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)    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[place])    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 ([place]) 09/24/2021   Cerebral embolism with cerebral infarction 09/23/2021   Pyelonephritis 09/19/2021   Pain due to onychomycosis of toenails of both feet 11/19/2020   Normocytic anemia 05/31/2020   Acute ischemic left MCA stroke ([place]) 04/30/2020   Right hemiplegia ([place]) 04/30/2020   Cerebrovascular accident ([place]) 04/21/2020   Atrial fibrillation with RVR ([place]) 04/21/2020   Essential hypertension 04/21/2020   Alcohol abuse 04/21/2020   Harrel Carina, MS, OTR/L   Harrel Carina, OT 11/25/2021, 3:00 PM  [place] MAIN [facility] SERVICES [address] [place], [place], [address] Phone: [phone]   Fax:  [phone]  Name: Lindsey [name] MRN: [id number] Date of Birth: 12/29/1955

## 2021-11-30 ENCOUNTER — Ambulatory Visit: Payer: Medicare Other | Admitting: Physical Therapy

## 2021-11-30 ENCOUNTER — Ambulatory Visit: Payer: Medicare Other | Admitting: Occupational Therapy

## 2021-11-30 ENCOUNTER — Other Ambulatory Visit: Payer: Self-pay

## 2021-11-30 ENCOUNTER — Encounter: Payer: Self-pay | Admitting: Occupational Therapy

## 2021-11-30 DIAGNOSIS — R2689 Other abnormalities of gait and mobility: Secondary | ICD-10-CM

## 2021-11-30 DIAGNOSIS — R262 Difficulty in walking, not elsewhere classified: Secondary | ICD-10-CM

## 2021-11-30 DIAGNOSIS — R269 Unspecified abnormalities of gait and mobility: Secondary | ICD-10-CM

## 2021-11-30 DIAGNOSIS — M6281 Muscle weakness (generalized): Secondary | ICD-10-CM

## 2021-11-30 NOTE — Therapy (Addendum)
[place] MAIN [facility] SERVICES [address] [place], [place], [address] Phone: [phone]   Fax:  [phone]  Occupational Therapy Progress Note  Dates of reporting period  10/28/2021   to   11/30/2021   Patient Details  Name: Lindsey [name] MRN: [id number] Date of Birth: [birth year] Referring Provider (OT): Dr. 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 No pertinent surgical history.  There were no vitals filed for this visit.   Subjective Assessment - 11/30/21 0855     Subjective  Pt reports no pain today    Patient is accompanied by: Family member    Pertinent History Pt. is a 66 y.o. 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 Pt. has Right sided weakness, and receptive, and expressive aphasia. Pt. has had a previous CVA with right sided weakness in July 2021. PMHx includes: AFib, Renal Artery thrombosis, situational depression, HTN, Pyelnephritis, Seizure activity, COnstipation, AKI. Vitamin D deficiency, and urinary incontinence/    Currently in Pain? No/denies                 Measurements were obtained, and goals were reviewed with the pt. Pt. has progressed with PROM for right shoulder flexion, abduction, elbow flexion, and wrist extension. Pt. Has progressed with donning a shirt, and hiking pants requiring less assist than at the initial evaluation. Pt. continues to require extensive assistance for UE, and LE ADL tasks, and MaxA donning the AFO brace. FOTO score improved from 12 at the initial assessment to 27. Pt. continues to work on improving ROM, and improving her ability to engage her RUE as a gross assist to the left UE during daily ADLs, and IADL tasks. Pt. continues to require work towards improving ADL, and IADL tasks.        [facility] OT Assessment - 11/30/21 1338       PROM   Overall PROM Comments Right shoulder flexion: 0(97), abduction: 0(84), elbow flexion: 0(0-124), wrist flexion: 0(75), wrist extension: 0(50)                              OT Education - 11/30/21 0855     Education Details RUE ROM, measurements, goals    Person(s) Educated Patient    Methods Explanation;Demonstration;Tactile cues;Verbal cues    Comprehension Returned demonstration;Verbal cues required;Need further instruction;Tactile cues required;Verbalized understanding  OT Short Term Goals - 11/30/21 0905       OT SHORT TERM GOAL #1   Title Pt and caregiver will be independent with HEP for RUE    Baseline Eval: No current program. 10th visit: Caregivers  report being comfortable with exercises at home.    Time 6    Period Weeks    Status On-going    Target Date 12/09/21               OT Long Term Goals - 11/30/21 0908       OT LONG TERM GOAL #1   Title Pt. will engage engage the RUE as a gross assist/atabilizer during ADLs 100% of the time    Baseline Eval: pt. does not currently engage her RUE during ADLs. 10th visit: Pt. continues to need to work towards using her RUE as a gross stabilizer.    Time 12    Period Weeks     Status On-going    Target Date 01/20/22      OT LONG TERM GOAL #2   Title Pt. will perform UE dressing using one armed dressing techniques    Baseline Eval: MaxA, 10th visit: ModA    Time 12    Period Weeks    Status On-going    Target Date 01/20/22      OT LONG TERM GOAL #3   Title Pt. will require minA to stand and hike pants    Baseline Eval: MaxA, 10th visit: ModA    Time 12    Period Weeks    Status On-going    Target Date 01/20/22      OT LONG TERM GOAL #4   Title Pt. will perform LE dressing with minA    Baseline Eval: MaxA 10th visit: Pt. requires MaxA for donning her brace, shoes, and socks. ModA with pants.    Time 12    Period Weeks    Status On-going    Target Date 01/21/22      OT LONG TERM GOAL #5   Title Pt. will improve FOTO score by 2 pt. to reflect funcational change    Baseline Eval: FOTO score: 12, 10th visit: FOTO score 27    Time 12    Period Weeks    Status On-going    Target Date 01/20/22      OT LONG TERM GOAL #6   Title Patient will transfer to bedside commode or toilet with supervision.                   Plan - 11/30/21 1336     Clinical Impression Statement Measurements were obtained, and goals were reviewed with the pt. Pt. has progressed with PROM for right shoulder flexion, abduction, elbow flexion, and wrist extension. Pt. Has progressed with donning a shirt, and hiking pants requiring less assist than at the initial evaluation. Pt. continues to require extensive assistance for UE, and LE ADL tasks, and MaxA donning the AFO brace. FOTO score improved from 12 at the initial assessment to 27. Pt. continues to work on improving ROM, and improving her ability to engage her RUE as a gross assist to the left UE during daily ADLs, and IADL tasks. Pt. continues to require work towards improving ADL, and IADL tasks.    OT Occupational Profile and History Detailed Assessment- Review of Records and additional review of physical, cognitive,  psychosocial history related to current functional performance    Occupational performance deficits (Please refer to  evaluation for details): Leisure;IADL's;ADL's    Body Structure / Function / Physical Skills ADL;IADL;FMC;ROM;UE functional use;Strength;Decreased knowledge of use of DME;Coordination    Rehab Potential Fair    Clinical Decision Making Several treatment options, min-mod task modification necessary    Comorbidities Affecting Occupational Performance: May have comorbidities impacting occupational performance    Modification or Assistance to Complete Evaluation  Min-Moderate modification of tasks or assist with assess necessary to complete eval    OT Frequency 2x / week    OT Duration 12 weeks    OT Treatment/Interventions Self-care/ADL training;Neuromuscular education;Electrical Stimulation;Patient/family education;Therapeutic activities;Passive range of motion;Therapist, nutritional;Therapeutic exercise;DME and/or AE instruction;Moist Heat    Consulted and Agree with Plan of Care Patient             Patient will benefit from skilled therapeutic intervention in order to improve the following deficits and impairments:   Body Structure / Function / Physical Skills: ADL, IADL, FMC, ROM, UE functional use, Strength, Decreased knowledge of use of DME, Coordination       Visit Diagnosis: Muscle weakness (generalized)    Problem List Patient Active Problem List   Diagnosis Date Noted   Elevated AST (SGOT) 10/22/2021   Lupus anticoagulant positive 10/22/2021   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)    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[place])    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 ([place]) 09/24/2021   Cerebral embolism with cerebral infarction 09/23/2021   Pyelonephritis 09/19/2021   Pain due to onychomycosis of toenails of both feet 11/19/2020   Normocytic anemia 05/31/2020    Acute ischemic left MCA stroke ([place]) 04/30/2020   Right hemiplegia ([place]) 04/30/2020   Cerebrovascular accident ([place]) 04/21/2020   Atrial fibrillation with RVR ([place]) 04/21/2020   Essential hypertension 04/21/2020   Alcohol abuse 04/21/2020   Harrel Carina, MS, OTR/L   Harrel Carina, OT 11/30/2021, 1:40 PM  [place] MAIN [facility] SERVICES [place], [place], [address] Phone: [phone]   Fax:  [phone]  Name: Quayla Lubitz MRN: [id number] Date of Birth: 04/23/1956

## 2021-11-30 NOTE — Therapy (Signed)
[place] [facility] SERVICES [place], [place], [address] Phone: [phone]   Fax:  [phone]  Physical Therapy Treatment  Patient Details  Name: Lindsey [name] MRN: [id number] Date of Birth: 1956-01-31 Referring Provider (PT): Izora Ribas, MD   Encounter Date: 11/30/2021   PT End of Session - 11/30/21 0916     Visit Number 3    Number of Visits 24    Date for PT Re-Evaluation 02/08/22    Authorization Time Period Cert through [id number]    Progress Note Due on Visit 10    PT Start Time 0920    PT Stop Time 1005    PT Time Calculation (min) 45 min    Equipment Utilized During Treatment Gait belt    Activity Tolerance Patient tolerated treatment well    Behavior During Therapy WFL for tasks assessed/performed             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  There were no vitals filed for this visit.   Subjective Assessment - 11/30/21 0918     Subjective Pt states she is doing well today. Reports no LOB/falls since evaluation.    Patient is accompained by: Family member   Lindsey [name] ( DTR)   Pertinent History Pt was doing very well and was walking short distances wqith a cane prior to second stroke in december. At this point she is limited to wheelchair as her main means of mobility. Pt caregiver reports all detailed information. Pt has hesitation with the left lower extremity and significant weakness on the right lower extremity. Pt will take 2-3 steps when transfering. Pt caregiver assists with all transfers.Pt has shower with ledge to steps over  ledge when entering and utilizes bedside commode. Pt has 2 level home with chair lift for upstairs access. Pt would like to improve ransfers, improve neglect on her R side and improve her overall strength and mobility.Pt. is a 66 y.o. 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 Pt. has Right sided weakness, and receptive, and expressive aphasia. Pt. has had a previous CVA with right sided weakness in July 2021. PMHx includes: AFib, Renal Artery thrombosis, situational depression, HTN, Pyelnephritis, Seizure activity, COnstipation, AKI. Vitamin D deficiency, and urinary incontinence.    Limitations Standing;Walking;House hold activities    How long can you sit comfortably? n/a    How long can you stand comfortably? 2 min    How long can you walk comfortably? 3-4 steps    Patient Stated Goals Improve balance and functional movements             Exercise/Activity Sets/Reps/Time/ Resistance Assistance Unless otherwise stated, CGA was provided and gait belt donned in order to ensure pt safety  Charge type Comments Unless otherwise stated, CGA was provided and gait belt donned in order to ensure pt safety'     MIN-MOD A for stand pivot transfer transport chair<>standard chair     There act  Improved  efficacy following interventions this date   Marchnig  2 x 10 on ea LE   Therapeutic exercise Hands over knees for external cue for movement goal  Increased difficulty on R ( involved) LE   LAQ 2 * 10  Therapeutic External cues for proper movement pattern  HR/ Toe Raise  2x10  Therapeutic exercise Increase difficulty on the right lower extremity  STS With static hold between sets, 5# AW donned on R LE for increased proprioception and to improve weightbearing with standing and transition .  Also utilizing mirror for improved knowledge of movements. X 10 trials with attempts to weight shift each transition.   Therapeutic activity  Added mirror for feedback and utilized hand lateral to pt hips for external cue for weight shifting at the hips  Cues for UE support, utilized hemiwalker on the left side for standing support.   Standing attempts at right lower extremity step up X5 attempts  Therapeutic activity Unable to step up on 4 inch step able to left lower extremity approximately 2 inches.  Will modify neck session appropriately as patient cannot complete task effectively.  Hip adduction with rainbow ball.  2 x 10 x 5 sec hold   Therapeutic exercise Cues for hold times and assistance for maintaining ball, positioning                          Treatment Provided this session   Pt educated throughout session about proper posture and technique with exercises. Improved exercise technique, movement at target joints, use of target muscles after min to mod verbal, visual, tactile cues. Note: Portions of this document were prepared using Dragon voice recognition software and although reviewed may contain unintentional dictation errors in syntax, grammar, or spelling.                             PT Education - 11/30/21 0915     Education Details Exercise forma nd technique    Person(s) Educated Patient    Methods Explanation    Comprehension Verbalized understanding              PT Short Term Goals - 11/16/21 1207       PT SHORT TERM GOAL #1   Title Patient will be independent in home exercise program to improve strength/mobility for better functional independence with ADLs.    Baseline no formal HEp for LE/balance at this time    Time 4    Period Weeks    Status New    Target Date 12/14/21               PT Long Term Goals - 11/16/21 1207       PT LONG TERM GOAL #1   Title Patient will increase FOTO score to equal to or greater than   41  to demonstrate statistically significant improvement in mobility and quality of life.    Baseline 16 on 2/14    Time 12    Period Weeks     Status New    Target Date 02/08/22      PT LONG TERM GOAL #3   Title Patient  will complete five times sit to stand test in < 30 seconds with UE assistance indicating an increased LE strength and improved balance.    Baseline 58.47 s with UE assist and with A from PT for R LE positioning    Time  12    Period Weeks    Status New    Target Date 02/08/22      PT LONG TERM GOAL #4   Title Patient will increase Berg Balance score by > 6 points to demonstrate decreased fall risk during functional activities.    Baseline 12 on 2/14    Time 12    Period Weeks    Status New    Target Date 02/08/22      PT LONG TERM GOAL #5   Title Pt will transfer from seated position on table/chair to/from transport chair/WC in order to indicate improved transfer ability.    Baseline Requires Min A for LE positioning and to prevent LOB.    Time 12    Period Weeks    Status New    Target Date 02/08/22                   Plan - 11/30/21 0917     Clinical Impression Statement Pt was highlymotivated for completion of PT session and exercises. Pt required multimodal cueing and responded well to external cues and mirror feedback for many exercsies and tasks. Pt had difficulty with shifting weight to involved side for transfer from transport chair to standard chair and following interventions this transfer was much easier for patient. Pt worked on shifting weight to involved side and required external cues for proper movement pattern. Pt unable to perform step up to 4 inch step on involved side but was able to lift R LE approximately 2 inches. Will continue to work on this next session.  May also consider adding hamstring curl exercise in order to improve patient's knee flexion when attempting marching activities in standing.  Patient will continue to benefit from skilled physical therapy intervention in order to improve lower extremity strength, mobility, transfers, and improve her ability to complete ADLs  with increased independence.    Personal Factors and Comorbidities Age;Comorbidity 1;Comorbidity 2;Comorbidity 3+;Time since onset of injury/illness/exacerbation;Other    Comorbidities AFib, Renal Artery thrombosis, situational depression, HTN, Pyelnephritis, Seizure activity, COnstipation, AKI    Examination-Activity Limitations Bathing;Bed Mobility;Carry;Locomotion Level;Dressing;Sit;Squat;Stairs;Stand;Toileting;Transfers    Examination-Participation Restrictions Church;Community Activity;Laundry    Stability/Clinical Decision Making Evolving/Moderate complexity    Rehab Potential Fair    PT Frequency 2x / week    PT Duration 12 weeks    PT Treatment/Interventions ADLs/Self Care Home Management;Gait training;Stair training;Therapeutic activities;Therapeutic exercise;Balance training;Neuromuscular re-education;Patient/family education;Wheelchair mobility training;Manual techniques;Energy conservation;Dry needling;Passive range of motion;Taping;Spinal Manipulations;Joint Manipulations    PT Next Visit Plan HEP    PT Home Exercise Plan HEP: general LE and postural exercises    Consulted and Agree with Plan of Care Patient             Patient will benefit from skilled therapeutic intervention in order to improve the following deficits and impairments:  Abnormal gait, Decreased activity tolerance, Decreased endurance, Decreased knowledge of use of DME, Decreased range of motion, Decreased strength, Hypomobility, Impaired flexibility, Difficulty walking, Decreased balance, Decreased coordination, Decreased knowledge of precautions, Decreased mobility, Decreased safety awareness  Visit Diagnosis: Difficulty in walking, not elsewhere classified  Other abnormalities of gait and mobility  Abnormality of gait and mobility     Problem List Patient Active Problem List   Diagnosis Date Noted   Elevated AST (SGOT) 10/22/2021   Lupus anticoagulant positive 10/22/2021   Combined receptive and  expressive aphasia as late effect of cerebrovascular accident (CVA)    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HCC)    Cerebral infarction  due to unspecified occlusion or stenosis of left cerebellar artery (HCC) 09/24/2021   Cerebral embolism with cerebral infarction 09/23/2021   Pyelonephritis 09/19/2021   Pain due to onychomycosis of toenails of both feet 11/19/2020   Normocytic anemia 05/31/2020   Acute ischemic left MCA stroke ([place]) 04/30/2020   Right hemiplegia ([place]) 04/30/2020   Cerebrovascular accident ([place]) 04/21/2020   Atrial fibrillation with RVR ([place]) 04/21/2020   Essential hypertension 04/21/2020   Alcohol abuse 04/21/2020    Particia Lather, PT 11/30/2021, 11:57 AM  [place], [place], [address] Phone: [phone]   Fax:  [phone]  Name: Teaundra Drach MRN: [id number] Date of Birth: 08/06/1956

## 2021-12-02 ENCOUNTER — Encounter: Payer: Self-pay | Admitting: Physical Therapy

## 2021-12-02 ENCOUNTER — Ambulatory Visit: Payer: Medicare Other | Admitting: Physical Therapy

## 2021-12-02 ENCOUNTER — Ambulatory Visit: Payer: Medicare Other | Admitting: Occupational Therapy

## 2021-12-02 ENCOUNTER — Encounter: Payer: Self-pay | Admitting: Occupational Therapy

## 2021-12-02 ENCOUNTER — Other Ambulatory Visit: Payer: Self-pay

## 2021-12-02 ENCOUNTER — Ambulatory Visit: Payer: Medicare Other | Attending: Physical Medicine and Rehabilitation | Admitting: Speech Pathology

## 2021-12-02 ENCOUNTER — Ambulatory Visit: Payer: Medicare Other

## 2021-12-02 DIAGNOSIS — R2689 Other abnormalities of gait and mobility: Secondary | ICD-10-CM | POA: Insufficient documentation

## 2021-12-02 DIAGNOSIS — R4701 Aphasia: Secondary | ICD-10-CM | POA: Diagnosis present

## 2021-12-02 DIAGNOSIS — R278 Other lack of coordination: Secondary | ICD-10-CM | POA: Insufficient documentation

## 2021-12-02 DIAGNOSIS — R482 Apraxia: Secondary | ICD-10-CM | POA: Insufficient documentation

## 2021-12-02 DIAGNOSIS — R262 Difficulty in walking, not elsewhere classified: Secondary | ICD-10-CM | POA: Insufficient documentation

## 2021-12-02 DIAGNOSIS — R269 Unspecified abnormalities of gait and mobility: Secondary | ICD-10-CM | POA: Insufficient documentation

## 2021-12-02 DIAGNOSIS — I63512 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 middle cerebral artery: Secondary | ICD-10-CM | POA: Diagnosis present

## 2021-12-02 DIAGNOSIS — M6281 Muscle weakness (generalized): Secondary | ICD-10-CM | POA: Insufficient documentation

## 2021-12-02 DIAGNOSIS — R2681 Unsteadiness on feet: Secondary | ICD-10-CM | POA: Diagnosis present

## 2021-12-02 DIAGNOSIS — I63542 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: Secondary | ICD-10-CM | POA: Insufficient documentation

## 2021-12-02 NOTE — Therapy (Signed)
[place] [facility] [facility] ?[place], [place], [address] ?Phone: [phone]   Fax:  [phone] ? ?Speech Language Pathology Treatment ? ?Patient Details  ?Name: Lindsey [name] ?MRN: [id number] ?Date of Birth: 04-30-1956 ?Referring Provider (SLP): Dr. Ranell Patrick ? ? ?Encounter Date: 12/02/2021 ? ? End of Session - 12/02/21 1618   ? ? Visit Number 8   ? Number of Visits 25   ? Date for SLP Re-Evaluation 01/26/22   ? Authorization Type Medicare/ medicaid secondary   ? SLP Start Time 1412   ? SLP Stop Time  1502   ? SLP Time Calculation (min) 50 min   ? Activity Tolerance Patient tolerated treatment well   ? ?  ?  ? ?  ? ? ?Past Medical History:  ?Diagnosis Date  ? Aphasia   ? 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 [facility] (cerebral vascular accident[facility])   ? Diverticulosis   ? History of ischemic left MCA stroke   ? Hypertension   ? Pain due to onychomycosis of toenails of both feet   ? 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 Uterine prolapse   ? ? ?No past surgical history on file. ? ?There were no vitals filed for this visit. ? ? Subjective Assessment - 12/02/21 1536   ? ? Subjective Pt chose "I'm fine" on Lingraphica   ? Patient is accompained by: Family member   [name]  ? Currently in Pain? No/denies   ? Pain Score 0-No pain   ? ?  ?  ? ?  ? ? ? ? ? ? ? ? ADULT SLP TREATMENT - 12/02/21 1542   ? ?  ? General Information  ? Behavior/Cognition Alert;Cooperative;Pleasant mood   ? HPI Patient is a 66 y.o. female with prior history of CVA in 2021 with residual aphasia (using Lingraphica AAC device to assist with communication as of 08/2021) referred by Dr. Ranell Patrick after recent hospital admission when she was found to have new L AICA infarct complicated by pyelonephritis. Admitted to [facility] and then CIR 12/18-12/30/22. MRI 09/22/21 showed acute moderate size L AICA cerebellar infarct, underlying extensive chronic left  MCA encephalomalacia related to 2021 infarct, left brainstem Wallerian degeneration and chronic left PICA cerebellar infarcts.   ?  ? Treatment Provided  ? Treatment provided Cognitive-Linquistic   ?  ? Cognitive-Linquistic Treatment  ? Treatment focused on Aphasia;Apraxia   ? Skilled Treatment Patient participated in device customization of adding images for "feelings" category. SLP used images of pt's expressions to create more concrete imagery for pt.  Created folders for therapy appointments (OT, ST, PT). Role-played using device to answer and ask questions about feelings and pain ( ie "How are you" "I'm fine" "No pain"). With min-mod gestural cues, pt activated icons to ask and answer "how are you?" During repetition of functional phrases, pt repeated phrases with ~60% accuracy, improved to 80% with cues for apraxia ("watch me", "say it with me"). With min cues, pt used device to communicate what she had for breakfast and to make selection for lunch. SLP/graduate clinician demonstrated to grandson how to assist pt to use device in upcoming PT appointment.   ?  ? Assessment / Recommendations / Plan  ? Plan Continue with current plan of care   ?  ? Progression Toward Goals  ? Progression toward goals Progressing toward goals   ? ?  ?  ? ?  ? ? ?  SLP Education - 12/02/21 1538   ? ? Education Details Use AAC to express how she is feeling, Use AAC to ask therapists "how are you?"   ? Person(s) Educated Patient;Other (comment)   [name]  ? Methods Explanation;Demonstration   ? Comprehension Verbalized understanding   ? ?  ?  ? ?  ? ? ? SLP Short Term Goals - 10/28/21 1739   ? ?  ? SLP SHORT TERM GOAL #1  ? Title Pt will answer personally relevant Y/N questions in context using multimodal communication or AAC if necessary, 75% accuracy.   ? Time 10   ? Period --   sessions  ? Status New   ?  ? SLP SHORT TERM GOAL #2  ? Title Given verbal or visual prompt, pt will correctly ID written word/object/icon in field  of 4-6 to communicate wants/needs with 75% accuracy given occasional mod A over 2 sessions   ? Time 10   ? Period --   visits  ? Status New   ?  ? SLP SHORT TERM GOAL #3  ? Title Pt will verbally approximate 3 significant words for 5/10 trials given occasional fading to rare mod A over 2 sessions   ? Time 10   ? Period --   sessions  ? Status New   ? ?  ?  ? ?  ? ? ? SLP Long Term Goals - 10/28/21 1742   ? ?  ? SLP LONG TERM GOAL #1  ? Title Pt will use multimodal communication system with mod assist to express wants/needs given >2 choices over 2 sessions for 4/5 opportunities   ? Time 12   ? Period Weeks   ? Status New   ? Target Date 01/26/22   ?  ? SLP LONG TERM GOAL #2  ? Title Caregiver will demonstrate appropriate use of supported conversation and use of multimodal supports to aid pt's comprehension of personally relevant topics, over 4 sessions.   ? Time 12   ? Period Weeks   ? Status New   ? Target Date 01/26/22   ?  ? SLP LONG TERM GOAL #3  ? Title Caregiver will appropriately cue patient when apraxic/aphasic errors occurs for 4/5 opportunities over 2 sessions   ? Time 12   ? Period Weeks   ? Status New   ? Target Date 01/26/22   ? ?  ?  ? ?  ? ? ? Plan - 12/02/21 1620   ? ? Clinical Impression Statement [name] presents with severe aphasia and severe verbal apraxia, chronic deficits exacerbated by new CVA in December 2022. Pt appears to comprehend concrete vs abstract images, and demonstrated increased skills in communicating feelings when concrete images added to her communication device to aid choicemaking. Pt with some responses today at phrase level that were fluent/appropriate (overlearned phrases). After model, pt demonstrated using device to answer and use greeting. I continue to recommend skilled ST with focus on functional communication to improve ability to communicate wants/needs, reduce frustration/social isolation and improve quality of life.   ? Speech Therapy Frequency 2x / week   ? Duration  12 weeks   ? Treatment/Interventions Compensatory strategies;Patient/family education;Functional tasks;Cueing hierarchy;Environmental controls;Cognitive reorganization;Multimodal communcation approach;Language facilitation;Compensatory techniques;Internal/external aids;SLP instruction and feedback;Other (comment)   ? Potential to [place]   ? Potential Considerations Severity of impairments;Previous level of function   ? SLP Home Exercise Plan bring Lingraphica to next session   ? Consulted and Agree with  Plan of Care Patient;Family member/caregiver   ? Family Member Consulted daughter   ? ?  ?  ? ?  ? ? ?Patient will benefit from skilled therapeutic intervention in order to improve the following deficits and impairments:   ?Aphasia ? ?Verbal apraxia ? ? ? ?Problem List ?Patient Active Problem List  ? Diagnosis Date Noted  ? Elevated AST (SGOT) 10/22/2021  ? 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 Cerebral embolism with cerebral infarction 09/23/2021  ? Pyelonephritis 09/19/2021  ? Pain due to onychomycosis of toenails of both feet 11/19/2020  ? Normocytic anemia 05/31/2020  ? Acute ischemic left MCA stroke ([place]) 04/30/2020  ? Right hemiplegia ([place]) 04/30/2020  ? Cerebrovascular accident [facility]) 04/21/2020  ? Atrial fibrillation with RVR ([place]) 04/21/2020  ? Essential hypertension 04/21/2020  ? Alcohol abuse 04/21/2020  ? ?Deneise Lever, MS, CCC-SLP ?Speech-Language Pathologist ?[phone] ? ? ?Aliene Altes, CCC-SLP ?12/02/2021, 6:33 PM ? ? ?[facility] ?[place], [place], [address] ?Phone: [phone]   Fax:  [phone] ? ? ?Name: Johnise Odoms ?MRN: [id number] ?Date of Birth: 1956/01/02 ? ?

## 2021-12-02 NOTE — Therapy (Signed)
[place] MAIN [facility] SERVICES [address] [place], [place], [address] Phone: [phone]   Fax:  [phone]  Physical Therapy Treatment  Patient Details  Name: Lindsey [name] MRN: [id number] Date of Birth: 1956/06/30 Referring Provider (PT): Ranell Patrick, Clide Deutscher, MD   Encounter Date: 12/02/2021   PT End of Session - 12/02/21 1715     Visit Number 4    Number of Visits 24    Date for PT Re-Evaluation 02/08/22    Authorization Time Period Cert through [id number]    Progress Note Due on Visit 10    PT Start Time 1515    PT Stop Time 1555    PT Time Calculation (min) 40 min    Equipment Utilized During Treatment Gait belt    Activity Tolerance Patient tolerated treatment well;Patient limited by fatigue;Treatment limited secondary to agitation;Patient limited by lethargy    Behavior During Therapy [employer] for tasks assessed/performed;Restless;Agitated             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 No pertinent surgical history.  There were no vitals filed for this visit.   Subjective Assessment - 12/02/21 1714     Subjective Pt patient presents to physical therapy with speech aid device from speech and language pathologist.  Utilizing this device patient reported she was doing well and had no pain..    Patient is accompained by: Family member   [name] ( DTR)   Pertinent History Pt was doing very well and was walking short distances wqith a cane prior to second stroke in december. At this point she is limited to wheelchair as her main means of mobility. Pt caregiver reports all detailed  information. Pt has hesitation with the left lower extremity and significant weakness on the right lower extremity. Pt will take 2-3 steps when transfering. Pt caregiver assists with all transfers.Pt has shower with ledge to steps over ledge when entering and utilizes bedside commode. Pt has 2 level home with chair lift for upstairs access. Pt would like to improve ransfers, improve neglect on her R side and improve her overall strength and mobility.Pt. is a 66 y.o. 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 Pt. has Right sided weakness, and receptive, and expressive aphasia. Pt. has had a previous CVA with right sided weakness in July 2021. PMHx includes: AFib, Renal Artery thrombosis, situational depression, HTN, Pyelnephritis, Seizure activity, COnstipation, AKI. Vitamin D deficiency, and urinary incontinence.    Limitations Standing;Walking;House hold activities    How long can you sit comfortably? n/a    How long can you stand comfortably? 2 min    How long can you walk comfortably? 3-4 steps    Patient Stated Goals Improve balance and functional movements    Currently in Pain? No/denies             Exercise/Activity Sets/Reps/Time/ Resistance Assistance Unless otherwise stated, CGA was provided and gait belt donned in order to ensure pt safety  Charge type Comments Unless otherwise stated,  CGA was provided and gait belt donned in order to ensure pt safety'     Mod A for balance and transitions for stand pivot transfer transport chair<>standard chair     There act  Improved efficacy following interventions this date   Marchnig  2 x 10 on ea LE   Therapeutic exercise Hands over knees for external cue for movement goal  Increased difficulty on R ( involved) LE   LAQ 2 * 10  Therapeutic External cues for proper movement pattern  HR/ Toe Raise  2x10  Therapeutic exercise Increase difficulty on the right lower  extremity  STS With static hold between sets, 5# AW donned on R LE for increased proprioception and to improve weightbearing with standing and transition .  Also utilizing mirror for improved knowledge of movements. X 5 trials with attempts to weight shift each transition.   Therapeutic activity Added mirror for feedback and utilized hand lateral to pt hips for external cue for weight shifting at the hips  Cues for UE support, utilized hemiwalker on the left side for standing support.   Standing R LE step taps to green theraband pad -see comments X10 attempts  Therapeutic activity Patient had some frustration with this activity but did perform better compared to previous session with forward step.  May hold this activity in future sessions in order to improve patient's confidence with tasks.  Hip adduction with rainbow ball.  2 x 10 x 5 sec hold   Therapeutic exercise Cues for hold times and assistance for maintaining ball, positioning  LAQ 2 x 10 bilaterally    " Not hard" but unable to reach adequate height with all reps for the right side.                     Treatment Provided this session   Pt educated throughout session about proper posture and technique with exercises. Improved exercise technique, movement at target joints, use of target muscles after min to mod verbal, visual, tactile cues. Note: Portions of this document were prepared using Dragon voice recognition software and although reviewed may contain unintentional dictation errors in syntax, grammar, or spelling.                            PT Education - 12/02/21 1715     Education Details Exercise form and technique    Person(s) Educated Patient    Methods Explanation    Comprehension Tactile cues required;Verbalized understanding;Returned demonstration;Verbal cues required;Need further instruction              PT Short Term Goals - 11/16/21 1207       PT SHORT TERM GOAL #1   Title Patient  will be independent in home exercise program to improve strength/mobility for better functional independence with ADLs.    Baseline no formal HEp for LE/balance at this time    Time 4    Period Weeks    Status New    Target Date 12/14/21               PT Long Term Goals - 11/16/21 1207       PT LONG TERM GOAL #1   Title Patient will increase FOTO score to equal to or greater than   41  to demonstrate statistically significant improvement in mobility and quality of life.    Baseline 16 on 2/14    Time 12  Period Weeks    Status New    Target Date 02/08/22      PT LONG TERM GOAL #3   Title Patient  will complete five times sit to stand test in < 30 seconds with UE assistance indicating an increased LE strength and improved balance.    Baseline 58.47 s with UE assist and with A from PT for R LE positioning    Time 12    Period Weeks    Status New    Target Date 02/08/22      PT LONG TERM GOAL #4   Title Patient will increase Berg Balance score by > 6 points to demonstrate decreased fall risk during functional activities.    Baseline 12 on 2/14    Time 12    Period Weeks    Status New    Target Date 02/08/22      PT LONG TERM GOAL #5   Title Pt will transfer from seated position on table/chair to/from transport chair/WC in order to indicate improved transfer ability.    Baseline Requires Min A for LE positioning and to prevent LOB.    Time 12    Period Weeks    Status New    Target Date 02/08/22                   Plan - 12/02/21 1716     Clinical Impression Statement Patient demonstrated fair motivation for completion of physical therapy exercises this session.  Patient continues to require multimodal cueing and responds well to external cues and mirror feedback for exercises.  Patient did demonstrate improved ability to perform transfers from transport chair to standard chair this session compared with previous session.  Patient did have significant  difficulty with right lower extremity stepping this session during this activity patient demonstrated some frustration and agitation with exercise difficulty.  Following this patient was less motivated to pursue completion of physical therapy activities this date.  Ended session a few minutes early as patient reported she was "ready to go home ".  Patient will continue to benefit from skilled physical therapy interventions in order to improve her strength, mobility, transfers, her ability to complete ADLs with increased independence.    Personal Factors and Comorbidities Age;Comorbidity 1;Comorbidity 2;Comorbidity 3+;Time since onset of injury/illness/exacerbation;Other    Comorbidities AFib, Renal Artery thrombosis, situational depression, HTN, Pyelnephritis, Seizure activity, COnstipation, AKI    Examination-Activity Limitations Bathing;Bed Mobility;Carry;Locomotion Level;Dressing;Sit;Squat;Stairs;Stand;Toileting;Transfers    Examination-Participation Restrictions Church;Community Activity;Laundry    Stability/Clinical Decision Making Evolving/Moderate complexity    Rehab Potential Fair    PT Frequency 2x / week    PT Duration 12 weeks    PT Treatment/Interventions ADLs/Self Care Home Management;Gait training;Stair training;Therapeutic activities;Therapeutic exercise;Balance training;Neuromuscular re-education;Patient/family education;Wheelchair mobility training;Manual techniques;Energy conservation;Dry needling;Passive range of motion;Taping;Spinal Manipulations;Joint Manipulations    PT Next Visit Plan HEP    PT Home Exercise Plan HEP: general LE and postural exercises    Consulted and Agree with Plan of Care Patient             Patient will benefit from skilled therapeutic intervention in order to improve the following deficits and impairments:  Abnormal gait, Decreased activity tolerance, Decreased endurance, Decreased knowledge of use of DME, Decreased range of motion, Decreased strength,  Hypomobility, Impaired flexibility, Difficulty walking, Decreased balance, Decreased coordination, Decreased knowledge of precautions, Decreased mobility, Decreased safety awareness  Visit Diagnosis: Difficulty in walking, not elsewhere classified  Other abnormalities of gait and mobility  Abnormality of  gait and mobility     Problem List Patient Active Problem List   Diagnosis Date Noted   Elevated AST (SGOT) 10/22/2021   Lupus anticoagulant positive 10/22/2021   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)    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[place])    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 ([place]) 09/24/2021   Cerebral embolism with cerebral infarction 09/23/2021   Pyelonephritis 09/19/2021   Pain due to onychomycosis of toenails of both feet 11/19/2020   Normocytic anemia 05/31/2020   Acute ischemic left MCA stroke ([place]) 04/30/2020   Right hemiplegia ([place]) 04/30/2020   Cerebrovascular accident ([place]) 04/21/2020   Atrial fibrillation with RVR ([place]) 04/21/2020   Essential hypertension 04/21/2020   Alcohol abuse 04/21/2020    Particia Lather, PT 12/02/2021, 5:18 PM  [place], [place], [address] Phone: [phone]   Fax:  [phone]  Name: Esme Negro MRN: [id number] Date of Birth: 1956/06/02

## 2021-12-02 NOTE — Therapy (Signed)
[place] MAIN [facility] SERVICES [address] [place], [place], [address] Phone: [phone]   Fax:  [phone]  Occupational Therapy Treatment  Patient Details  Name: Lindsey [name] MRN: [id number] Date of Birth: December 31, 1955 Referring Provider (OT): Dr. 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 No pertinent surgical history.  There were no vitals filed for this visit.   Subjective Assessment - 12/02/21 1457     Subjective  Pt reports no pain today    Patient is accompanied by: Family member    Pertinent History Pt. is a 66 y.o. 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 Pt. has Right sided weakness, and receptive, and expressive aphasia. Pt. has had a previous CVA with right sided weakness in July 2021. PMHx includes: AFib, Renal Artery thrombosis, situational depression, HTN, Pyelnephritis, Seizure activity, COnstipation, AKI. Vitamin D deficiency, and urinary incontinence/    Limitations Right side weakness, reptive, and expressive ahasia    Currently in Pain? No/denies            OT  TREATMENT     Self-care:    Pt. worked on simulated LE dressing skills, hiking pants. Pt. was able to donn/doff the band over her left foot with CGA while sitting, minA to donn/doff it over the right. Pt. required CGA to stand, and was able to perform the hiking motion with the left hand with cues for the right side.   Neuromuscular re-education:   Pt. worked on weightbearing, and proprioception through the right forearm in sidesitting with the addition of slow rocking to normalize tone, and prepare the RUE for ROM. Pt. Worked on bilateral alternating UE rowing to prepare the trunk, and UEs for ROM.    Therapeutic Exercise:   Pt. tolerated PROM in all joint ranges of the RUE. Pt. tolerated shoulder flexion, abduction, horizontal abduction, elbow flexion, and extension, forearm supination, and pronation, wrist extension, digit MP, PIP, and DIP flexion, extension, and thumb radial, and palmar abduction.  Reviewed self-ROM with the pt. For the right hand. Pt. worked on standing, crossing midline, and reaching with the RUE to place them over the vertical dowels.    Manual Therapy:   Pt. Tolerated scapular mobilizations through elevation, depression, abduction/rotation to normalize tone. Pt. Tolerated soft tissue mobilizations radial on ulna for supination, and metacarpal spread stretches. Manual therapy was performed independent of, and in preparation for there. Ex.   Pt.  Continues to make progress with formulating the movements needed for hiking LE clothing. Pt. Continues to improve with standing, and transfers requiring less cuing. Pt. required increased verbal, and tactile cues for performing bilateral alternating UE rowing, requiring cues to motor plan through the addition of the rocking component. Pt. Requires visual, and verbal, and tactile cues for hand placement, and directions. Pt. Continues to work on improving UE strength, and [facility] skills in order to work towards improving, and maximizing  independence with ADLs, and IADL tasks.                         OT Education - 12/02/21 1457     Education Details RUE ROM, measurements, goals    Person(s) Educated Patient    Methods Explanation;Demonstration;Tactile cues;Verbal cues    Comprehension Returned demonstration;Verbal cues required;Need further instruction;Tactile cues required;Verbalized understanding              OT Short Term Goals - 11/30/21 0905       OT SHORT TERM GOAL #1   Title Pt and caregiver will be independent with HEP for RUE    Baseline Eval: No current program. 10th visit: Caregivers  report being comfortable with exercises at home.    Time 6    Period Weeks    Status On-going    Target Date 12/09/21               OT Long Term Goals - 11/30/21 0908       OT LONG TERM GOAL #1   Title Pt. will engage engage the RUE as a gross assist/atabilizer during ADLs 100% of the time    Baseline Eval: pt. does not currently engage her RUE during ADLs. 10th visit: Pt. continues to need to work towards using her RUE as a gross stabilizer.    Time 12    Period Weeks    Status On-going    Target Date 01/20/22      OT LONG TERM GOAL #2   Title Pt. will perform UE dressing using one armed dressing techniques    Baseline Eval: MaxA, 10th visit: ModA    Time 12    Period Weeks    Status On-going    Target Date 01/20/22      OT LONG TERM GOAL #3   Title Pt. will require minA to stand and hike pants    Baseline Eval: MaxA, 10th visit: ModA    Time 12    Period Weeks    Status On-going    Target Date 01/20/22      OT LONG TERM GOAL #4   Title Pt. will perform LE dressing with minA    Baseline Eval: MaxA 10th visit: Pt. requires MaxA for donning her brace, shoes, and socks. ModA with pants.    Time 12    Period Weeks    Status On-going    Target Date 01/21/22      OT LONG TERM GOAL #5   Title Pt. will improve FOTO score by 2 pt. to reflect funcational change    Baseline  Eval: FOTO score: 12, 10th visit: FOTO score 27    Time 12    Period Weeks    Status On-going    Target Date 01/20/22      OT LONG TERM GOAL #6   Title Patient will transfer to bedside commode or toilet with supervision.  Plan - 12/02/21 1458     Clinical Impression Statement Pt. Continues to make progress with formulating the movements needed for hiking LE clothing. Pt. Continues to improve with standing, and transfers requiring less cuing. Pt. required increased verbal, and tactile cues for performing bilateral alternating UE rowing, requiring cues to motor plan through the addition of the rocking component. Pt. Requires visual, and verbal, and tactile cues for hand placement, and directions. Pt. Continues to work on improving UE strength, and [facility] skills in order to work towards improving, and maximizing independence with ADLs, and IADL tasks.      OT Occupational Profile and History Detailed Assessment- Review of Records and additional review of physical, cognitive, psychosocial history related to current functional performance    Occupational performance deficits (Please refer to evaluation for details): Leisure;IADL's;ADL's    Body Structure / Function / Physical Skills ADL;IADL;FMC;ROM;UE functional use;Strength;Decreased knowledge of use of DME;Coordination    Rehab Potential Fair    Clinical Decision Making Several treatment options, min-mod task modification necessary    Comorbidities Affecting Occupational Performance: May have comorbidities impacting occupational performance    Modification or Assistance to Complete Evaluation  Min-Moderate modification of tasks or assist with assess necessary to complete eval    OT Frequency 2x / week    OT Duration 12 weeks    OT Treatment/Interventions Self-care/ADL training;Neuromuscular education;Electrical Stimulation;Patient/family education;Therapeutic activities;Passive range of motion;Wellsite geologist;Therapeutic exercise;DME and/or AE instruction;Moist Heat    Consulted and Agree with Plan of Care Patient             Patient will benefit from skilled therapeutic intervention in order to improve the following deficits and impairments:   Body Structure / Function / Physical Skills: ADL, IADL, FMC, ROM, UE functional use, Strength, Decreased knowledge of use of DME, Coordination       Visit Diagnosis: Muscle weakness (generalized)  Other lack of coordination    Problem List Patient Active Problem List   Diagnosis Date Noted   Elevated AST (SGOT) 10/22/2021   Lupus anticoagulant positive 10/22/2021   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)    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[place])    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 ([place]) 09/24/2021   Cerebral embolism with cerebral infarction 09/23/2021   Pyelonephritis 09/19/2021   Pain due to onychomycosis of toenails of both feet 11/19/2020   Normocytic anemia 05/31/2020   Acute ischemic left MCA stroke ([place]) 04/30/2020   Right hemiplegia ([place]) 04/30/2020   Cerebrovascular accident ([place]) 04/21/2020   Atrial fibrillation with RVR ([place]) 04/21/2020   Essential hypertension 04/21/2020   Alcohol abuse 04/21/2020   Harrel Carina, MS, OTR/L   Harrel Carina, OT 12/02/2021, 3:00 PM  [place] MAIN [facility] SERVICES [address] [place], [place], [address] Phone: [phone]   Fax:  [phone]  Name: Lindsey [name] MRN: [id number] Date of Birth: 1956-01-16

## 2021-12-03 ENCOUNTER — Ambulatory Visit: Admission: RE | Admit: 2021-12-03 | Payer: Medicare Other | Source: Ambulatory Visit

## 2021-12-07 ENCOUNTER — Other Ambulatory Visit: Payer: Self-pay

## 2021-12-07 ENCOUNTER — Ambulatory Visit: Payer: Medicare Other | Admitting: Speech Pathology

## 2021-12-07 ENCOUNTER — Ambulatory Visit: Payer: Medicare Other

## 2021-12-07 DIAGNOSIS — R482 Apraxia: Secondary | ICD-10-CM

## 2021-12-07 DIAGNOSIS — R4701 Aphasia: Secondary | ICD-10-CM | POA: Diagnosis not present

## 2021-12-07 DIAGNOSIS — R278 Other lack of coordination: Secondary | ICD-10-CM

## 2021-12-07 DIAGNOSIS — M6281 Muscle weakness (generalized): Secondary | ICD-10-CM

## 2021-12-07 NOTE — Therapy (Signed)
[place] MAIN [facility] SERVICES [address] [place], [place], [address] Phone: [phone]   Fax:  [phone]  Occupational Therapy Treatment  Patient Details  Name: Lindsey [name] MRN: [id number] Date of Birth: 1956-05-27 Referring Provider (OT): Dr. 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  There were no vitals filed for this visit.   Subjective Assessment - 12/07/21 1425     Subjective  Pt able to verbalize that she was feeling well today with intermittent use of her communication device.    Patient is accompanied by: Family member    Pertinent History Pt. is a 66 y.o. 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 Pt. has Right sided weakness, and receptive, and expressive aphasia. Pt. has had a previous CVA with right sided weakness in July 2021. PMHx includes: AFib, Renal Artery thrombosis, situational depression, HTN, Pyelnephritis, Seizure activity, COnstipation, AKI. Vitamin D deficiency, and urinary incontinence/     Limitations Right side weakness, reptive, and expressive ahasia    Patient Stated Goals To be able to dress herself.    Currently in Pain? No/denies    Pain Score 0-No pain            Occupational Therapy Treatment: Therapeutic Exercise: Performed PROM to R shoulder for flex/abd/horiz abd/add, gentle ER, elbow flex/ext, forearm pron/sup, and wrist and digit flex/ext, working to reduce joint stiffness and tone throughout RUE in prep for functional tranfers and self care.    Self Care: OT noted yeast/odor to R hand, with yeast present between digits.  OT reviewed hand hygiene techniques and importance of soap/water several times throughout day with thorough drying between digits before splint is donned.  Pt able to demo thorough hand hygiene with set up of hand washing supplies, and pt was independent to identify when hand was not quite dry and needed another dry washcloth to finish drying.  Instructed pt in sit to stand transfers and stand pivot transfers from mat table slightly elevated, then lowered to lowest level after several repetitions, with step pivot to chair at 90 degrees from mat table to simulate bed<>BSC transfer using hemi walker for support.  Pt required initial sequencing cues for placing feet beneath knees and L hand on arm rest of chair "commode," and tactile cue for anterior lean during ascent to standing.  Practiced erect standing  with min guard-close supv with L hand on hemi walker for stability to enable improved pt and caregiver ease for managing clothing in standing.  Vc/tactile cues for tucking buttocks and forward chin for erect standing posture.  Pt required min A and vc to control descent.    Response to Treatment: Pt able to step pivot with hemi walker and min A, occasional min guard, to complete stand pivot and step pivot transfers to simulate BSC transfers in the home stepping toward and away from hemiparetic side.  Pt required repeated tactile and vc for anterior lean  to enable a sit to stand with min guard.  Good ability to step pivot with support of hemi walker and for static standing; pt required min A to control descent to chair and cues to avoid sitting too early.  Pt tolerated RUE ROM well and demonstrated indep to don resting hand splint following hand hygiene, extra time needed for indep donning.  Pt will continue to benefit from skilled OT for reducing stiffness in R shoulder joint in order to ease caregiver assist with UB ADLs, ADL training, tone reduction strategies, and functional transfer training in order to maximize indep with daily tasks and reduce caregiver burden in the home.      OT Education - 12/07/21 1426     Education Details hand hygiene on R hemiparetic side, sit to stand transfer technique.    Person(s) Educated Patient    Methods Explanation;Demonstration;Tactile cues;Verbal cues    Comprehension Returned demonstration;Verbal cues required;Need further instruction;Tactile cues required;Verbalized understanding              OT Short Term Goals - 11/30/21 0905       OT SHORT TERM GOAL #1   Title Pt and caregiver will be independent with HEP for RUE    Baseline Eval: No current program. 10th visit: Caregivers  report being comfortable with exercises at home.    Time 6    Period Weeks    Status On-going    Target Date 12/09/21               OT Long Term Goals - 11/30/21 0908       OT LONG TERM GOAL #1   Title Pt. will engage engage the RUE as a gross assist/atabilizer during ADLs 100% of the time    Baseline Eval: pt. does not currently engage her RUE during ADLs. 10th visit: Pt. continues to need to work towards using her RUE as a gross stabilizer.    Time 12    Period Weeks    Status On-going    Target Date 01/20/22      OT LONG TERM GOAL #2   Title Pt. will perform UE dressing using one armed dressing techniques    Baseline Eval: MaxA, 10th visit: ModA    Time 12    Period Weeks    Status On-going     Target Date 01/20/22      OT LONG TERM GOAL #3   Title Pt. will require minA to stand and hike pants    Baseline Eval: MaxA, 10th visit: ModA    Time 12    Period Weeks    Status On-going    Target Date 01/20/22      OT LONG TERM GOAL #4   Title Pt. will perform LE dressing with minA    Baseline Eval: MaxA 10th visit: Pt. requires MaxA for donning her brace, shoes, and socks. ModA with pants.  Time 12    Period Weeks    Status On-going    Target Date 01/21/22      OT LONG TERM GOAL #5   Title Pt. will improve FOTO score by 2 pt. to reflect funcational change    Baseline Eval: FOTO score: 12, 10th visit: FOTO score 27    Time 12    Period Weeks    Status On-going    Target Date 01/20/22      OT LONG TERM GOAL #6   Title Patient will transfer to bedside commode or toilet with supervision.              Plan - 12/07/21 1447     Clinical Impression Statement Pt able to step pivot with hemi walker and min A, occasional min guard, to complete stand pivot and step pivot transfers to simulate BSC transfers in the home stepping toward and away from hemiparetic side.  Pt required repeated tactile and vc for anterior lean to enable a sit to stand with min guard.  Good ability to step pivot with support of hemi walker and for static standing; pt required min A to control descent to chair and cues to avoid sitting too early.  Pt tolerated RUE ROM well and demonstrated indep to don resting hand splint following hand hygiene, extra time needed for indep donning.  Pt will continue to benefit from skilled OT for reducing stiffness in R shoulder joint in order to ease caregiver assist with UB ADLs, ADL training, tone reduction strategies, and functional transfer training in order to maximize indep with daily tasks and reduce caregiver burden in the home.    OT Occupational Profile and History Detailed Assessment- Review of Records and additional review of physical, cognitive, psychosocial  history related to current functional performance    Occupational performance deficits (Please refer to evaluation for details): Leisure;IADL's;ADL's    Body Structure / Function / Physical Skills ADL;IADL;FMC;ROM;UE functional use;Strength;Decreased knowledge of use of DME;Coordination    Rehab Potential Fair    Clinical Decision Making Several treatment options, min-mod task modification necessary    Comorbidities Affecting Occupational Performance: May have comorbidities impacting occupational performance    Modification or Assistance to Complete Evaluation  Min-Moderate modification of tasks or assist with assess necessary to complete eval    OT Frequency 2x / week    OT Duration 12 weeks    OT Treatment/Interventions Self-care/ADL training;Neuromuscular education;Electrical Stimulation;Patient/family education;Therapeutic activities;Passive range of motion;Therapist, nutritional;Therapeutic exercise;DME and/or AE instruction;Moist Heat    Consulted and Agree with Plan of Care Patient             Patient will benefit from skilled therapeutic intervention in order to improve the following deficits and impairments:   Body Structure / Function / Physical Skills: ADL, IADL, FMC, ROM, UE functional use, Strength, Decreased knowledge of use of DME, Coordination       Visit Diagnosis: Muscle weakness (generalized)  Other lack of coordination    Problem List Patient Active Problem List   Diagnosis Date Noted   Elevated AST (SGOT) 10/22/2021   Lupus anticoagulant positive 10/22/2021   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)    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[place])    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 ([place]) 09/24/2021   Cerebral embolism with cerebral infarction 09/23/2021   Pyelonephritis 09/19/2021   Pain due to onychomycosis of toenails of both feet 11/19/2020   Normocytic  anemia 05/31/2020   Acute ischemic left MCA stroke ([place]) 04/30/2020   Right hemiplegia ([place]) 04/30/2020   Cerebrovascular accident ([place]) 04/21/2020   Atrial fibrillation with RVR ([place]) 04/21/2020   Essential hypertension 04/21/2020   Alcohol abuse 04/21/2020   Leta Speller, MS, OTR/L  Darleene Cleaver, OT 12/07/2021, 2:47 PM  [place] MAIN [facility] SERVICES [address] [place], [place], [address] Phone: [phone]   Fax:  [phone]  Name: Lindsey [name] MRN: [id number] Date of Birth: 1955/11/18

## 2021-12-07 NOTE — Therapy (Signed)
[place] [facility] [facility] ?[place], [place], [address] ?Phone: [phone]   Fax:  [phone] ? ?Speech Language Pathology Treatment ? ?Patient Details  ?Name: Lindsey [name] ?MRN: [id number] ?Date of Birth: 1956/07/21 ?Referring Provider (SLP): Dr. Ranell Patrick ? ? ?Encounter Date: 12/07/2021 ? ? End of Session - 12/07/21 1049   ? ? Visit Number 9   ? Number of Visits 25   ? Date for SLP Re-Evaluation 01/26/22   ? Authorization Type Medicare/ medicaid secondary   ? SLP Start Time [phone]   ? SLP Stop Time  1010   ? SLP Time Calculation (min) 67 min   ? Activity Tolerance Patient tolerated treatment well   ? ?  ?  ? ?  ? ? ?Past Medical History:  ?Diagnosis Date  ? Aphasia   ? 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 CVA (cerebral vascular accident) [facility])   ? Diverticulosis   ? History of ischemic left MCA stroke   ? Hypertension   ? Pain due to onychomycosis of toenails of both feet   ? 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 Uterine prolapse   ? ? ?No past surgical history on file. ? ?There were no vitals filed for this visit. ? ? Subjective Assessment - 12/07/21 1026   ? ? Subjective "I'm happy" on Lingraphica device   ? Patient is accompained by: Family member   Grandson Lindsey [name]  ? Currently in Pain? No/denies   ? Pain Score 0-No pain   ? ?  ?  ? ?  ? ? ? ? ? ? ? ? ADULT SLP TREATMENT - 12/07/21 1027   ? ?  ? General Information  ? Behavior/Cognition Alert;Cooperative;Pleasant mood   ? HPI Patient is a 66 y.o. female with prior history of CVA in 2021 with residual aphasia (using Lingraphica AAC device to assist with communication as of 08/2021) referred by Dr. Ranell Patrick after recent hospital admission when she was found to have new L AICA infarct complicated by pyelonephritis. Admitted to [facility] and then CIR 12/18-12/30/22. MRI 09/22/21 showed acute moderate size L AICA cerebellar infarct, underlying extensive chronic left  MCA encephalomalacia related to 2021 infarct, left brainstem Wallerian degeneration and chronic left PICA cerebellar infarcts.   ?  ? Treatment Provided  ? Treatment provided Cognitive-Linquistic   ?  ? Cognitive-Linquistic Treatment  ? Treatment focused on Aphasia;Apraxia   ? Skilled Treatment With min-mod gestural cues, pt activated icons to answer "I'm happy" and to ask SLP/graduate clinician "how are you." Utilized family support/feedback to create more functional communication opportunities using the device by providing alternatives such as rejecting a choice or requesting "something else." Targeted pt's yes/no responses while simultaneously customizing food items in expanded categories by eliminating non-preferred foods. During this task, with min gestural cues pt provided yes/no responses with ~70% accuracy for likes/dislikes. Pt required min-mod verbal and gestural cues to navigate sections of device. Facilitated using device to answer and ask questions about feelings and pain as she began OT appointment. With min cues, pt used device with occupational therapist to respond "I'm happy" and "no pain". Pt verbally asked therapist "How do you feel?"   ?  ? Assessment / Recommendations / Plan  ? Plan Continue with current plan of care   ?  ? Progression Toward Goals  ? Progression toward goals Progressing toward goals   ? ?  ?  ? ?  ? ? ?  SLP Education - 12/07/21 1046   ? ? Education Details Using device to express feelings, and to make food selections   ? Person(s) Educated Patient;Other (comment)   Grandson Lindsey [name]  ? Methods Explanation;Demonstration   ? Comprehension Verbalized understanding   ? ?  ?  ? ?  ? ? ? SLP Short Term Goals - 10/28/21 1739   ? ?  ? SLP SHORT TERM GOAL #1  ? Title Pt will answer personally relevant Y/N questions in context using multimodal communication or AAC if necessary, 75% accuracy.   ? Time 10   ? Period --   sessions  ? Status New   ?  ? SLP SHORT TERM GOAL #2  ? Title Given  verbal or visual prompt, pt will correctly ID written word/object/icon in field of 4-6 to communicate wants/needs with 75% accuracy given occasional mod A over 2 sessions   ? Time 10   ? Period --   visits  ? Status New   ?  ? SLP SHORT TERM GOAL #3  ? Title Pt will verbally approximate 3 significant words for 5/10 trials given occasional fading to rare mod A over 2 sessions   ? Time 10   ? Period --   sessions  ? Status New   ? ?  ?  ? ?  ? ? ? SLP Long Term Goals - 10/28/21 1742   ? ?  ? SLP LONG TERM GOAL #1  ? Title Pt will use multimodal communication system with mod assist to express wants/needs given >2 choices over 2 sessions for 4/5 opportunities   ? Time 12   ? Period Weeks   ? Status New   ? Target Date 01/26/22   ?  ? SLP LONG TERM GOAL #2  ? Title Caregiver will demonstrate appropriate use of supported conversation and use of multimodal supports to aid pt's comprehension of personally relevant topics, over 4 sessions.   ? Time 12   ? Period Weeks   ? Status New   ? Target Date 01/26/22   ?  ? SLP LONG TERM GOAL #3  ? Title Caregiver will appropriately cue patient when apraxic/aphasic errors occurs for 4/5 opportunities over 2 sessions   ? Time 12   ? Period Weeks   ? Status New   ? Target Date 01/26/22   ? ?  ?  ? ?  ? ? ? Plan - 12/07/21 1051   ? ? Clinical Impression Statement Lindsey [name] with severe aphasia and severe verbal apraxia, chronic deficits exacerbated by new CVA in December 2022. Pt was able to express feelings and pain by selecting icons on device. Pt participated in adding food choices to device to aid in choice making. Pt with some responses today at phrase level that were fluent/appropriate (overlearned phrases). Pt demonstrated using device to answer and use greeting in OT appointment. I continue to recommend skilled ST with focus on functional communication to improve ability to communicate wants/needs, reduce frustration/social isolation and improve quality of life.   ? Speech  Therapy Frequency 2x / week   ? Duration 12 weeks   ? Treatment/Interventions Compensatory strategies;Patient/family education;Functional tasks;Cueing hierarchy;Environmental controls;Cognitive reorganization;Multimodal communcation approach;Language facilitation;Compensatory techniques;Internal/external aids;SLP instruction and feedback;Other (comment)   ? Potential to [place]   ? Potential Considerations Severity of impairments;Previous level of function   ? SLP Home Exercise Plan bring Lingraphica to next session   ? Consulted and Agree with Plan of Care Patient;Family member/caregiver   ?  Family Member Consulted daughter   ? ?  ?  ? ?  ? ? ?Patient will benefit from skilled therapeutic intervention in order to improve the following deficits and impairments:   ?Aphasia ? ?Verbal apraxia ? ? ? ?Problem List ?Patient Active Problem List  ? Diagnosis Date Noted  ? Elevated AST (SGOT) 10/22/2021  ? 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 Cerebral embolism with cerebral infarction 09/23/2021  ? Pyelonephritis 09/19/2021  ? Pain due to onychomycosis of toenails of both feet 11/19/2020  ? Normocytic anemia 05/31/2020  ? Acute ischemic left MCA stroke ([place]) 04/30/2020  ? Right hemiplegia ([place]) 04/30/2020  ? Cerebrovascular accident [facility]) 04/21/2020  ? Atrial fibrillation with RVR ([place]) 04/21/2020  ? Essential hypertension 04/21/2020  ? Alcohol abuse 04/21/2020  ?Caryl Comes Smith-Sneed SLP Student   ? ?[place] [facility], [place] ?12/07/2021, 11:00 AM ? ?[place] ?[place] [facility] ?[place], [place], [address] ?Phone: [phone]   Fax:  [phone] ? ? ?Name: Lindsey [name] ?MRN: [id number] ?Date of Birth: September 19, 1956 ? ?

## 2021-12-09 ENCOUNTER — Ambulatory Visit: Payer: Medicare Other | Admitting: Occupational Therapy

## 2021-12-09 ENCOUNTER — Ambulatory Visit: Payer: Medicare Other

## 2021-12-09 ENCOUNTER — Other Ambulatory Visit: Payer: Self-pay

## 2021-12-09 ENCOUNTER — Encounter: Payer: Self-pay | Admitting: Occupational Therapy

## 2021-12-09 ENCOUNTER — Ambulatory Visit: Payer: Medicare Other | Admitting: Speech Pathology

## 2021-12-09 DIAGNOSIS — R2681 Unsteadiness on feet: Secondary | ICD-10-CM

## 2021-12-09 DIAGNOSIS — M6281 Muscle weakness (generalized): Secondary | ICD-10-CM

## 2021-12-09 DIAGNOSIS — R4701 Aphasia: Secondary | ICD-10-CM

## 2021-12-09 DIAGNOSIS — R269 Unspecified abnormalities of gait and mobility: Secondary | ICD-10-CM

## 2021-12-09 DIAGNOSIS — R482 Apraxia: Secondary | ICD-10-CM

## 2021-12-09 DIAGNOSIS — R262 Difficulty in walking, not elsewhere classified: Secondary | ICD-10-CM

## 2021-12-09 NOTE — Therapy (Signed)
[name] [facility] SERVICES [address] [place], [place], [address] Phone: [phone]   Fax:  [phone]  Physical Therapy Treatment  Patient Details  Name: Lindsey [name] MRN: [id number] Date of Birth: Oct 21, 1955 Referring Provider (PT): Ranell Patrick Clide Deutscher, MD   Encounter Date: 12/09/2021   PT End of Session - 12/10/21 0001     Visit Number 5    Number of Visits 24    Date for PT Re-Evaluation 02/08/22    Authorization Time Period Cert through [id number]    Progress Note Due on Visit 10    PT Start Time 1515    PT Stop Time 1559    PT Time Calculation (min) 44 min    Activity Tolerance Patient tolerated treatment well    Behavior During Therapy [facility] for tasks assessed/performed             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 No pertinent surgical history.  There were no vitals filed for this visit.   Subjective Assessment - 12/09/21 1513     Subjective Patient reports via Communication board that she is happy today.    Patient is accompained by: Family member   Lindsey [name] ( DTR)   Pertinent History Pt was doing very well and was walking short distances wqith a cane prior to second stroke in december. At this point she is limited to wheelchair as her main means of mobility. Pt caregiver reports all detailed information. Pt has hesitation with the left lower extremity and significant weakness on the right lower extremity. Pt will take 2-3 steps when transfering. Pt caregiver assists with all transfers.Pt has shower with ledge to steps over ledge when entering and utilizes bedside  commode. Pt has 2 level home with chair lift for upstairs access. Pt would like to improve ransfers, improve neglect on her R side and improve her overall strength and mobility.Pt. is a 66 y.o. 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 Pt. has Right sided weakness, and receptive, and expressive aphasia. Pt. has had a previous CVA with right sided weakness in July 2021. PMHx includes: AFib, Renal Artery thrombosis, situational depression, HTN, Pyelnephritis, Seizure activity, COnstipation, AKI. Vitamin D deficiency, and urinary incontinence.    Limitations Standing;Walking;House hold activities    How long can you sit comfortably? n/a    How long can you stand comfortably? 2 min    How long can you walk comfortably? 3-4 steps    Patient Stated Goals Improve balance and functional movements    Currently in Pain? No/denies            INTERVENTIONS:   Gait training:  - patient ambulated approx 30 feet with hemiwalker L, RAFO and CGA/Min A using gait belt. PT provided verbal cues (repetitive) for gait sequencing including placement of walker and step length. Patient exhibited very short step to, slow cadence with difficulty with right LE placement - scissoring over midline. Patient also presented with difficulty with right UE - sublux/dangling without  support PT provided more cues and visual demonstration for correct sequencing and patient ambulated another 45 feet with improved ability to walk with less scissoring.    Transfer training:   Stand pivot transfer- Min to Mod A to stand with VC for hand and feet placement as well as to lean forward x multiple attempts. Patient did improve with foot placement with practice.   Therex:   Seated hip march alt LE x 10 reps (AAROM right LE)  Seated hip add with ball squeeze x 5 sec- 2 sets of 10 reps Seated Knee ext (AAROM on right) Alt LE - 2 sets of 10 reps  Education  provided throughout session via VC/TC and demonstration to facilitate movement at target joints and correct muscle activation for all testing and exercises performed.                       PT Education - 12/09/21 1514     Education Details Exercise technique    Person(s) Educated Patient    Methods Explanation;Demonstration;Tactile cues;Verbal cues    Comprehension Verbalized understanding;Returned demonstration;Verbal cues required;Tactile cues required;Need further instruction              PT Short Term Goals - 11/16/21 1207       PT SHORT TERM GOAL #1   Title Patient will be independent in home exercise program to improve strength/mobility for better functional independence with ADLs.    Baseline no formal HEp for LE/balance at this time    Time 4    Period Weeks    Status New    Target Date 12/14/21               PT Long Term Goals - 11/16/21 1207       PT LONG TERM GOAL #1   Title Patient will increase FOTO score to equal to or greater than   41  to demonstrate statistically significant improvement in mobility and quality of life.    Baseline 16 on 2/14    Time 12    Period Weeks    Status New    Target Date 02/08/22      PT LONG TERM GOAL #3   Title Patient  will complete five times sit to stand test in < 30 seconds with UE assistance indicating an increased LE strength and improved balance.    Baseline 58.47 s with UE assist and with A from PT for R LE positioning    Time 12    Period Weeks    Status New    Target Date 02/08/22      PT LONG TERM GOAL #4   Title Patient will increase Berg Balance score by > 6 points to demonstrate decreased fall risk during functional activities.    Baseline 12 on 2/14    Time 12    Period Weeks    Status New    Target Date 02/08/22      PT LONG TERM GOAL #5   Title Pt will transfer from seated position on table/chair to/from transport chair/WC in order to indicate improved transfer ability.     Baseline Requires Min A for LE positioning and to prevent LOB.    Time 12    Period Weeks    Status New    Target Date 02/08/22                   Plan - 12/09/21 1233     Clinical  Impression Statement Patient arrived today with good motivation- Had communication board but did use much without prompting. She was able to walk today with use of Hemiwalker with physical assist. Will need to discuss with OT if any bracing warranted for right arm with gait. She responded well to [facility] and physical assist today and was pleased to be up walking. She was limited by fatigue overall today. Patient will continue to benefit from skilled physical therapy interventions in order to improve her strength, mobility, transfers, her ability to complete ADLs with increased independence    Personal Factors and Comorbidities Age;Comorbidity 1;Comorbidity 2;Comorbidity 3+;Time since onset of injury/illness/exacerbation;Other    Comorbidities AFib, Renal Artery thrombosis, situational depression, HTN, Pyelnephritis, Seizure activity, COnstipation, AKI    Examination-Activity Limitations Bathing;Bed Mobility;Carry;Locomotion Level;Dressing;Sit;Squat;Stairs;Stand;Toileting;Transfers    Examination-Participation Restrictions Church;Community Activity;Laundry    Stability/Clinical Decision Making Evolving/Moderate complexity    Rehab Potential Fair    PT Frequency 2x / week    PT Duration 12 weeks    PT Treatment/Interventions ADLs/Self Care Home Management;Gait training;Stair training;Therapeutic activities;Therapeutic exercise;Balance training;Neuromuscular re-education;Patient/family education;Wheelchair mobility training;Manual techniques;Energy conservation;Dry needling;Passive range of motion;Taping;Spinal Manipulations;Joint Manipulations    PT Next Visit Plan Gait training, Transfer training, LE strengthening as appropriate.    PT Home Exercise Plan HEP: general LE and postural exercises    Consulted and Agree  with Plan of Care Patient             Patient will benefit from skilled therapeutic intervention in order to improve the following deficits and impairments:  Abnormal gait, Decreased activity tolerance, Decreased endurance, Decreased knowledge of use of DME, Decreased range of motion, Decreased strength, Hypomobility, Impaired flexibility, Difficulty walking, Decreased balance, Decreased coordination, Decreased knowledge of precautions, Decreased mobility, Decreased safety awareness  Visit Diagnosis: Abnormality of gait and mobility  Difficulty in walking, not elsewhere classified  Muscle weakness (generalized)  Unsteadiness on feet     Problem List Patient Active Problem List   Diagnosis Date Noted   Elevated AST (SGOT) 10/22/2021   Lupus anticoagulant positive 10/22/2021   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)    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[place])    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 [place], [place], [address] Phone: [phone]   Fax:  [phone]  Name: Lindsey [name] MRN: [id number] Date of Birth: April 22, 1956

## 2021-12-09 NOTE — Patient Instructions (Addendum)
Try to take some pictures with Ariane's device: ? ?Her bed ?Her shower ?Her bathroom/toilet ?Where she watches TV ? ?I finally got access to her TalkPath Therapy account. I added some new tasks.  ?

## 2021-12-09 NOTE — Therapy (Addendum)
[place] [facility] ?[place], [place], [address] ?Phone: [phone]   Fax:  [phone] ? ?Speech Language Pathology Treatment and Progress Note ? ?Patient Details  ?Name: Lindsey [name] ?MRN: [id number] ?Date of Birth: [birth year] ?Referring Provider (SLP): Dr. Ranell Patrick ? ?Speech Therapy Progress Note ? ?Dates of Reporting Period: 10/28/2021 to 12/09/2021 ? ?Objective: Patient has been seen for 10 speech therapy sessions this reporting period targeting aphasia and AAC. Patient is making progress toward LTGs and met 2/3 STGs this reporting period. See skilled intervention, clinical impressions, and goals below for details. ? ?Encounter Date: 12/09/2021 ? ? End of Session - 12/09/21 1720   ? ? Visit Number 10   ? Number of Visits 25   ? Date for SLP Re-Evaluation 01/26/22   ? Authorization Type Medicare/ medicaid secondary   ? SLP Start Time 1600   ? SLP Stop Time  1700   ? SLP Time Calculation (min) 60 min   ? Activity Tolerance Patient tolerated treatment well   ? ?  ?  ? ?  ? ? ?Past Medical History:  ?Diagnosis Date  ? Aphasia   ? 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 CVA (cerebral vascular accident) [facility])   ? Diverticulosis   ? History of ischemic left MCA stroke   ? Hypertension   ? Pain due to onychomycosis of toenails of both feet   ? 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 Uterine prolapse   ? ? ?No past surgical history on file. ? ?There were no vitals filed for this visit. ? ? Subjective Assessment - 12/09/21 1716   ? ? Subjective Engages with graduate clinician using Lingraphica device   ? Patient is accompained by: Family member   [name]  ? Currently in Pain? No/denies   ? ?  ?  ? ?  ? ? ? ? ? ? ? ? ADULT SLP TREATMENT - 12/09/21 1717   ? ?  ? General Information  ? Behavior/Cognition Alert;Cooperative;Pleasant mood   ? HPI Patient is a 66 y.o. female with prior history of CVA in 2021 with residual  aphasia (using Lingraphica AAC device to assist with communication as of 08/2021) referred by Dr. Ranell Patrick after recent hospital admission when she was found to have new L AICA infarct complicated by pyelonephritis. Admitted to [facility] and then CIR 12/18-12/30/22. MRI 09/22/21 showed acute moderate size L AICA cerebellar infarct, underlying extensive chronic left MCA encephalomalacia related to 2021 infarct, left brainstem Wallerian degeneration and chronic left PICA cerebellar infarcts.   ?  ? Cognitive-Linquistic Treatment  ? Treatment focused on Aphasia;Apraxia   ? Skilled Treatment Patient used icons in Speech therapy section on Lingraphica to interact with graduate student clinician with min-mod cues. [name] reports pt used with OT/PT as well (therapists guiding). Functional repetition of phrases 80% accuracy with mod cues. Added new icons for pt to interact and make comments around preferred tv shows. While watching from the Price is Right and Let's Make a Deal, pt used "wow" "Higher" "Lower" to express her opinions with mod cues.   ?  ? Assessment / Recommendations / Plan  ? Plan Continue with current plan of care   ?  ? Progression Toward Goals  ? Progression toward goals Progressing toward goals   ? ?  ?  ? ?  ? ? ? SLP Education - 12/09/21 1720   ? ? Education Details updated  tasks on talkpath therapy   ? Person(s) Educated Patient;Other (comment)   [name]  ? Methods Explanation   ? Comprehension Verbalized understanding   ? ?  ?  ? ?  ? ? SLP Short Term Goals - 12/13/21 0924   ? ?  ? SLP SHORT TERM GOAL #1  ? Title Pt will answer personally relevant Y/N questions in context using multimodal communication or AAC if necessary, 75% accuracy.   ? Time 10   ? Period --   sessions  ? Status Achieved   ?  ? SLP SHORT TERM GOAL #2  ? Title Given verbal or visual prompt, pt will correctly ID written word/object/icon in field of 4-6 to communicate wants/needs with 75% accuracy given occasional mod A over 2  sessions   ? Baseline F:3 12/09/21   ? Time 10   ? Period --   visits  ? Status Not Met   ?  ? SLP SHORT TERM GOAL #3  ? Title Pt will verbally approximate 3 significant words for 5/10 trials given occasional fading to rare mod A over 2 sessions   ? Time 10   ? Period --   sessions  ? Status Achieved   ? ?  ?  ? ?  ? ? SLP Long Term Goals - 12/13/21 0930   ? ?  ? SLP LONG TERM GOAL #1  ? Title Pt will use multimodal communication system with mod assist to express wants/needs given >2 choices over 2 sessions for 4/5 opportunities   ? Time 12   ? Period Weeks   ? Status On-going   ?  ? SLP LONG TERM GOAL #2  ? Title Caregiver will demonstrate appropriate use of supported conversation and use of multimodal supports to aid pt's comprehension of personally relevant topics, over 4 sessions.   ? Time 12   ? Period Weeks   ? Status On-going   ?  ? SLP LONG TERM GOAL #3  ? Title Caregiver will appropriately cue patient when apraxic/aphasic errors occurs for 4/5 opportunities over 2 sessions   ? Time 12   ? Period Weeks   ? Status On-going   ? ?  ?  ? ?  ? ? ? ? ? ? ? ? Plan - 12/09/21 1721   ? ? Clinical Impression Statement Lindsey [name] presents with severe aphasia and severe verbal apraxia, chronic deficits exacerbated by new CVA in December 2022. Pt was able to express feelings and pain by selecting icons on device.. Pt used device to answer questions and use greeting in OT and PT appointments. Able to make some comments/express opinions when watching clips of favorite TV show with mod cues. I continue to recommend skilled ST with focus on functional communication to improve ability to communicate wants/needs, reduce frustration/social isolation and improve quality of life.   ? Speech Therapy Frequency 2x / week   ? Duration 12 weeks   ? Treatment/Interventions Compensatory strategies;Patient/family education;Functional tasks;Cueing hierarchy;Environmental controls;Cognitive reorganization;Multimodal communcation  approach;Language facilitation;Compensatory techniques;Internal/external aids;SLP instruction and feedback;Other (comment)   ? Potential to [place]   ? Potential Considerations Severity of impairments;Previous level of function   ? SLP Home Exercise Plan bring Lingraphica to next session   ? Consulted and Agree with Plan of Care Patient;Family member/caregiver   ? Family Member Consulted daughter   ? ?  ?  ? ?  ? ? ?Patient will benefit from skilled therapeutic intervention in order to improve the following deficits  and impairments:   ?Aphasia ? ?Verbal apraxia ? ? ? ?Problem List ?Patient Active Problem List  ? Diagnosis Date Noted  ? Elevated AST (SGOT) 10/22/2021  ? 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 Cerebral embolism with cerebral infarction 09/23/2021  ? Pyelonephritis 09/19/2021  ? Pain due to onychomycosis of toenails of both feet 11/19/2020  ? Normocytic anemia 05/31/2020  ? Acute ischemic left MCA stroke ([place]) 04/30/2020  ? Right hemiplegia ([place]) 04/30/2020  ? Cerebrovascular accident [facility]) 04/21/2020  ? Atrial fibrillation with RVR ([place]) 04/21/2020  ? Essential hypertension 04/21/2020  ? Alcohol abuse 04/21/2020  ? ?Deneise Lever, MS, CCC-SLP ?Speech-Language Pathologist ?([phone] ? ?Aliene Altes, CCC-SLP ?12/09/2021, 5:22 PM ? ?[place] ?[facility] ?[place], [place], [address] ?Phone: [phone]   Fax:  [phone] ? ? ?Name: Lindsey [name] ?MRN: [id number] ?Date of Birth: Apr 20, 1956 ? ?

## 2021-12-09 NOTE — Therapy (Signed)
[place] [facility] ?[place], [place], [address] ?Phone: [phone]   Fax:  [phone] ? ?Occupational Therapy Treatment ? ?Patient Details  ?Name: Lindsey [name] ?MRN: [id number] ?Date of Birth: 1955/12/24 ?Referring Provider (OT): Dr. Ranell Patrick ? ? ?Encounter Date: 12/09/2021 ? ? OT End of Session - 12/09/21 1555   ? ? Visit Number 13   ? Number of Visits 24   ? Date for OT Re-Evaluation 01/20/22   ? Authorization Type Progress report period starting 12/02/2021   ? OT Start Time 1435   ? OT Stop Time 1515   ? OT Time Calculation (min) 40 min   ? Activity Tolerance Patient tolerated treatment well   ? Behavior During Therapy [facility] for tasks assessed/performed   ? ?  ?  ? ?  ? ? ?Past Medical History:  ?Diagnosis Date  ? Aphasia   ? 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 CVA (cerebral vascular accident) [facility])   ? Diverticulosis   ? History of ischemic left MCA stroke   ? Hypertension   ? Pain due to onychomycosis of toenails of both feet   ? 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 Uterine prolapse   ? ? ? ?OT TREATMENT   ?  ?Therapeutic Exercise: ?  ?Pt. tolerated PROM in all joint ranges of the RUE. Pt. Tolerated Passive  scapular elevation, depression, abd/adduction/rotation, shoulder flexion, abduction, horizontal abduction, elbow flexion, and extension, forearm supination, and pronation, wrist extension, digit MP, PIP, and DIP flexion, extension, and thumb radial, and palmar abduction.   ? ?Orthotic fitting: ? ?Pt. was measured for, and provided with additional splint straps for the digits, and thumb. Pt. presented with good alignment of the right hand in the splint.  ?  ?Pt. Tolerated splint strap adjustments to the right hand splint. Pt. presents with increased shoulder subluxation in the right shoulder today. Education was provided to pt.'s son about shoulder stabilization braces, support of the RUE in  sitting, and during transfers. Plan to measure pt. for a shoulder harness brace. Pt. continues to present with increased right hand flexor tone, and tightness. Pt. continues to work on normalizing tone, and facilitating functional volitional movement in the RUE in order to assist with self dressing, ADLs, and transfers.  ?  ?  ?  ? ? ? ? ? ? ? ? ? ? ? ? ? ? ? ? ? ? ? ? ? OT Education - 12/09/21 1554   ? ? Education Details hand hygiene on R hemiparetic side, sit to stand transfer technique.   ? Person(s) Educated Patient   ? Methods Explanation;Demonstration;Tactile cues;Verbal cues   ? Comprehension Returned demonstration;Verbal cues required;Need further instruction;Tactile cues required;Verbalized understanding   ? ?  ?  ? ?  ? ? ? OT Short Term Goals - 11/30/21 0905   ? ?  ? OT SHORT TERM GOAL #1  ? Title Pt and caregiver will be independent with HEP for RUE   ? Baseline Eval: No current program. 10th visit: Caregivers  report being comfortable with exercises at home.   ? Time 6   ? Period Weeks   ? Status On-going   ? Target Date 12/09/21   ? ?  ?  ? ?  ? ? ? ? OT Long Term Goals - 11/30/21 0908   ? ?  ? OT LONG TERM GOAL #1  ? Title Pt. will  engage engage the RUE as a gross assist/atabilizer during ADLs 100% of the time   ? Baseline Eval: pt. does not currently engage her RUE during ADLs. 10th visit: Pt. continues to need to work towards using her RUE as a gross stabilizer.   ? Time 12   ? Period Weeks   ? Status On-going   ? Target Date 01/20/22   ?  ? OT LONG TERM GOAL #2  ? Title Pt. will perform UE dressing using one armed dressing techniques   ? Baseline Eval: MaxA, 10th visit: ModA   ? Time 12   ? Period Weeks   ? Status On-going   ? Target Date 01/20/22   ?  ? OT LONG TERM GOAL #3  ? Title Pt. will require minA to stand and hike pants   ? Baseline Eval: MaxA, 10th visit: ModA   ? Time 12   ? Period Weeks   ? Status On-going   ? Target Date 01/20/22   ?  ? OT LONG TERM GOAL #4  ? Title Pt. will perform  LE dressing with minA   ? Baseline Eval: MaxA 10th visit: Pt. requires MaxA for donning her brace, shoes, and socks. ModA with pants.   ? Time 12   ? Period Weeks   ? Status On-going   ? Target Date 01/21/22   ?  ? OT LONG TERM GOAL #5  ? Title Pt. will improve FOTO score by 2 pt. to reflect funcational change   ? Baseline Eval: FOTO score: 12, 10th visit: FOTO score 27   ? Time 12   ? Period Weeks   ? Status On-going   ? Target Date 01/20/22   ?  ? OT LONG TERM GOAL #6  ? Title Patient will transfer to bedside commode or toilet with supervision.   ? ?  ?  ? ?  ? ? ? ? ? ? ? ? Plan - 12/09/21 1557   ? ? Clinical Impression Statement Pt. Tolerated splint strap adjustments to the right hand splint. Pt. presents with increased shoulder subluxation in the right shoulder today. Education was provided to pt.'s son about shoulder stabilization braces, support of the RUE in sitting, and during transfers. Plan to measure pt. for a shoulder harness brace. Pt. continues to present with increased right hand flexor tone, and tightness. Pt. continues to work on normalizing tone, and facilitating functional volitional movement in the RUE in order to assist with self dressing, ADLs, and transfers.  ?   ? OT Occupational Profile and History Detailed Assessment- Review of Records and additional review of physical, cognitive, psychosocial history related to current functional performance   ? Occupational performance deficits (Please refer to evaluation for details): Leisure;IADL's;ADL's   ? Body Structure / Function / Physical Skills ADL;IADL;FMC;ROM;UE functional use;Strength;Decreased knowledge of use of DME;Coordination   ? Rehab Potential Fair   ? Clinical Decision Making Several treatment options, min-mod task modification necessary   ? Comorbidities Affecting Occupational Performance: May have comorbidities impacting occupational performance   ? Modification or Assistance to Complete Evaluation  Min-Moderate modification of  tasks or assist with assess necessary to complete eval   ? OT Frequency 2x / week   ? OT Duration 12 weeks   ? OT Treatment/Interventions Self-care/ADL training;Neuromuscular education;Electrical Stimulation;Patient/family education;Therapeutic activities;Passive range of motion;Therapist, nutritional;Therapeutic exercise;DME and/or AE instruction;Moist Heat   ? Consulted and Agree with Plan of Care Patient   ? ?  ?  ? ?  ? ? ?  Patient will benefit from skilled therapeutic intervention in order to improve the following deficits and impairments:   ?Body Structure / Function / Physical Skills: ADL, IADL, FMC, ROM, UE functional use, Strength, Decreased knowledge of use of DME, Coordination ?  ?  ? ? ?Visit Diagnosis: ?Muscle weakness (generalized) ? ? ? ?Problem List ?Patient Active Problem List  ? Diagnosis Date Noted  ? Elevated AST (SGOT) 10/22/2021  ? 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 Cerebral embolism with cerebral infarction 09/23/2021  ? Pyelonephritis 09/19/2021  ? Pain due to onychomycosis of toenails of both feet 11/19/2020  ? Normocytic anemia 05/31/2020  ? Acute ischemic left MCA stroke ([place]) 04/30/2020  ? Right hemiplegia ([place]) 04/30/2020  ? Cerebrovascular accident [facility]) 04/21/2020  ? Atrial fibrillation with RVR ([place]) 04/21/2020  ? Essential hypertension 04/21/2020  ? Alcohol abuse 04/21/2020  ? ?Harrel Carina, MS, OTR/L  ? ?Harrel Carina, OT ?12/09/2021, 3:59 PM ? ?[place] [facility] ?[place], [place], [address] ?Phone: [phone]   Fax:  [phone] ? ?Name: Lindsey [name] ?MRN: [id number] ?Date of Birth: 1956/02/17 ? ?

## 2021-12-14 ENCOUNTER — Other Ambulatory Visit: Payer: Self-pay

## 2021-12-14 ENCOUNTER — Ambulatory Visit: Payer: Medicare Other

## 2021-12-14 DIAGNOSIS — R278 Other lack of coordination: Secondary | ICD-10-CM

## 2021-12-14 DIAGNOSIS — R2689 Other abnormalities of gait and mobility: Secondary | ICD-10-CM

## 2021-12-14 DIAGNOSIS — R2681 Unsteadiness on feet: Secondary | ICD-10-CM

## 2021-12-14 DIAGNOSIS — R4701 Aphasia: Secondary | ICD-10-CM | POA: Diagnosis not present

## 2021-12-14 DIAGNOSIS — M6281 Muscle weakness (generalized): Secondary | ICD-10-CM

## 2021-12-14 NOTE — Therapy (Signed)
[place] [facility] ?[place], [place], [address] ?Phone: [phone]   Fax:  [phone] ? ?Physical Therapy Treatment ? ?Patient Details  ?Name: Lindsey [name] ?MRN: [id number] ?Date of Birth: 1956-07-09 ?Referring Provider (PT): Raulkar, Clide Deutscher, MD ? ? ?Encounter Date: 12/14/2021 ? ? PT End of Session - 12/14/21 1131   ? ? Visit Number 6   ? Number of Visits 24   ? Date for PT Re-Evaluation 02/08/22   ? Authorization Time Period Cert through [id number]   ? Progress Note Due on Visit 10   ? PT Start Time [id number]   ? PT Stop Time 1000   ? PT Time Calculation (min) 28 min   ? Equipment Utilized During Treatment Gait belt   ? Activity Tolerance Patient tolerated treatment well   ? Behavior During Therapy [facility] for tasks assessed/performed   ? ?  ?  ? ?  ? ? ?Past Medical History:  ?Diagnosis Date  ? Aphasia   ? 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 CVA (cerebral vascular accident) [facility])   ? Diverticulosis   ? History of ischemic left MCA stroke   ? Hypertension   ? Pain due to onychomycosis of toenails of both feet   ? 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 Uterine prolapse   ? ? ?History reviewed. No pertinent surgical history. ? ?There were no vitals filed for this visit. ? ? Subjective Assessment - 12/14/21 0934   ? ? Subjective Pt reports no pain currently. Pt reports she has done her HEP.   ? Patient is accompained by: Family member   Holiday representative ( DTR)  ? Pertinent History Pt was doing very well and was walking short distances wqith a cane prior to second stroke in december. At this point she is limited to wheelchair as her main means of mobility. Pt caregiver reports all detailed information. Pt has hesitation with the left lower extremity and significant weakness on the right lower extremity. Pt will take 2-3 steps when transfering. Pt caregiver assists with all transfers.Pt has shower with ledge to steps  over ledge when entering and utilizes bedside commode. Pt has 2 level home with chair lift for upstairs access. Pt would like to improve ransfers, improve neglect on her R side and improve her overall strength and mobility.Pt. is a 66 y.o. 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 Pt. has Right sided weakness, and receptive, and expressive aphasia. Pt. has had a previous CVA with right sided weakness in July 2021. PMHx includes: AFib, Renal Artery thrombosis, situational depression, HTN, Pyelnephritis, Seizure activity, COnstipation, AKI. Vitamin D deficiency, and urinary incontinence.   ? Limitations Standing;Walking;House hold activities   ? How long can you sit comfortably? n/a   ? How long can you stand comfortably? 2 min   ? How long can you walk comfortably? 3-4 steps   ? Patient Stated Goals Improve balance and functional movements   ? Currently in Pain? No/denies   ? ?  ?  ? ?  ? ? ?INTERVENTIONS:  ?  ?  ?Transfer training:  ?  ?Stand pivot transfer transport chair <> standard chair- mod a to mod a +2  with VC for hand and feet placement as well as to lean forward x2.  ?  ?Therex:  ?Seated hip march alt LE 2x 15 reps (AAROM  right LE); Pt rates easy on L side and medium on R side  ?LAQ 15x each LE ?--progressed LLE to 2.5# 15x by 2 sets; Pt also performed 2nd set RLE no weight 15x, and then 3rd set on RLE with 2.5# (intermittent assist) 15x ?STS 7x with mod assist. VC/TC for LE and UE placement.  ? ? ?Education provided throughout session via VC/TC and demonstration to facilitate movement at target joints and correct muscle activation for all testing and exercises performed.  ? ? PT Short Term Goals - 11/16/21 1207   ? ?  ? PT SHORT TERM GOAL #1  ? Title Patient will be independent in home exercise program to improve strength/mobility for better functional independence with ADLs.   ? Baseline no formal HEp for LE/balance at this time    ? Time 4   ? Period Weeks   ? Status New   ? Target Date 12/14/21   ? ?  ?  ? ?  ? ? ? ? PT Long Term Goals - 11/16/21 1207   ? ?  ? PT LONG TERM GOAL #1  ? Title Patient will increase FOTO score to equal to or greater than   41  to demonstrate statistically significant improvement in mobility and quality of life.   ? Baseline 16 on 2/14   ? Time 12   ? Period Weeks   ? Status New   ? Target Date 02/08/22   ?  ? PT LONG TERM GOAL #3  ? Title Patient  will complete five times sit to stand test in < 30 seconds with UE assistance indicating an increased LE strength and improved balance.   ? Baseline 58.47 s with UE assist and with A from PT for R LE positioning   ? Time 12   ? Period Weeks   ? Status New   ? Target Date 02/08/22   ?  ? PT LONG TERM GOAL #4  ? Title Patient will increase Berg Balance score by > 6 points to demonstrate decreased fall risk during functional activities.   ? Baseline 12 on 2/14   ? Time 12   ? Period Weeks   ? Status New   ? Target Date 02/08/22   ?  ? PT LONG TERM GOAL #5  ? Title Pt will transfer from seated position on table/chair to/from transport chair/WC in order to indicate improved transfer ability.   ? Baseline Requires Min A for LE positioning and to prevent LOB.   ? Time 12   ? Period Weeks   ? Status New   ? Target Date 02/08/22   ? ?  ?  ? ?  ? ? ? ? ? ? ? ? Plan - 12/14/21 1132   ? ? Clinical Impression Statement Session limited secondary to pt late arrival. However, pt highly motivated to participate in treatment. She tolerated session well without pain or significant fatigue. Pt was able to advance LE exercises by performing them with increased weights. Pt also able to perform multiple STS with mod assist. The pt will benefit from further skilled PT intervention to improve strength, mobility, and transfers to increase ease with ADLs.   ? Personal Factors and Comorbidities Age;Comorbidity 1;Comorbidity 2;Comorbidity 3+;Time since onset of injury/illness/exacerbation;Other    ? Comorbidities AFib, Renal Artery thrombosis, situational depression, HTN, Pyelnephritis, Seizure activity, COnstipation, AKI   ? Examination-Activity Limitations Bathing;Bed Mobility;Carry;Locomotion Level;Dressing;Sit;Squat;Stairs;Stand;Toileting;Transfers   ? Examination-Participation Restrictions Church;Community Activity;Laundry   ? Stability/Clinical Decision Making Evolving/Moderate  complexity   ? Rehab Potential Fair   ? PT Frequency 2x / week   ? PT Duration 12 weeks   ? PT Treatment/Interventions ADLs/Self Care Home Management;Gait training;Stair training;Therapeutic activities;Therapeutic exercise;Balance training;Neuromuscular re-education;Patient/family education;Wheelchair mobility training;Manual techniques;Energy conservation;Dry needling;Passive range of motion;Taping;Spinal Manipulations;Joint Manipulations   ? PT Next Visit Plan Gait training, Transfer training, LE strengthening as appropriate.   ? PT Home Exercise Plan HEP: general LE and postural exercises   ? Consulted and Agree with Plan of Care Patient   ? ?  ?  ? ?  ? ? ?Patient will benefit from skilled therapeutic intervention in order to improve the following deficits and impairments:  Abnormal gait, Decreased activity tolerance, Decreased endurance, Decreased knowledge of use of DME, Decreased range of motion, Decreased strength, Hypomobility, Impaired flexibility, Difficulty walking, Decreased balance, Decreased coordination, Decreased knowledge of precautions, Decreased mobility, Decreased safety awareness ? ?Visit Diagnosis: ?Muscle weakness (generalized) ? ?Other abnormalities of gait and mobility ? ?Other lack of coordination ? ?Unsteadiness on feet ? ? ? ? ?Problem List ?Patient Active Problem List  ? Diagnosis Date Noted  ? Elevated AST (SGOT) 10/22/2021  ? 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 Cerebral embolism with cerebral infarction 09/23/2021  ? Pyelonephritis 09/19/2021  ? Pain due t

## 2021-12-15 NOTE — Therapy (Signed)
[place] ?[facility] ?[place], [place], [address] ?Phone: [phone]   Fax:  [phone] ? ?Occupational Therapy Treatment ? ?Lindsey [name] Details  ?Name: Lindsey [name] ?MRN: [id number] ?Date of Birth: 08-05-1956 ?Referring Provider (OT): Dr. Ranell Patrick ? ? ?Encounter Date: 12/14/2021 ? ? OT End of Session - 12/15/21 0840   ? ? Visit Number 14   ? Number of Visits 24   ? Date for OT Re-Evaluation 01/20/22   ? Authorization Type Progress report period starting 12/02/2021   ? OT Start Time 1015   ? OT Stop Time 1100   ? OT Time Calculation (min) 45 min   ? Equipment Utilized During Treatment transport chair   ? Activity Tolerance Lindsey [name] tolerated treatment well   ? Behavior During Therapy [facility] for tasks assessed/performed   ? ?  ?  ? ?  ? ? ?Past Medical History:  ?Diagnosis Date  ? Aphasia   ? 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 CVA (cerebral vascular accident) [facility])   ? Diverticulosis   ? History of ischemic left MCA stroke   ? Hypertension   ? Pain due to onychomycosis of toenails of both feet   ? 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 Uterine prolapse   ? ? ?No past surgical history on file. ? ?There were no vitals filed for this visit. ? ? Subjective Assessment - 12/14/21 0839   ? ? Subjective  Lindsey [name] present with daughter today.   ? Lindsey [name] is accompanied by: Family member   ? Pertinent History Lindsey [name]. is a 66 y.o. 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 Lindsey [name]. has Right sided weakness, and receptive, and expressive aphasia. Lindsey [name]. has had a previous CVA with right sided weakness in July 2021. PMHx includes: AFib, Renal Artery thrombosis, situational depression, HTN, Pyelnephritis, Seizure activity, COnstipation, AKI. Vitamin D deficiency, and urinary incontinence/   ? Limitations Right side weakness, reptive, and expressive ahasia   ? Lindsey [name]  Stated Goals To be able to dress herself.   ? Currently in Pain? No/denies   ? Pain Score 0-No pain   ? Multiple Pain Sites No   ? ?  ?  ? ?  ? ?Occupational Therapy Treatment: ?Self Care: ?Practiced sit to stand from chair with arm rests and mat table, multiple trials all with min guard only and hemiwalker.  OT provided support to RUE during transfer, and provided intermittent tactile cues for anterior lean during sit to stand.  Practiced step pivot transfers mat<>chair to simulate BSC transfers in the home; multiple trials completed all with min guard.  Used theraband tied around BLEs to simulate hiking/lowering waist of pants for managing clothes during dressing and toileting tasks.  Lindsey [name] able to maintain dynamic standing balance with min A and intermittent support on hemiwalker during this task simulation.     ? ?Orthotic fit training: ?Educated Lindsey [name]/daughter on benefits of R hemi sling to support subluxed shoulder, helping to prevent painful shoulder and worsening sublux during mobility.  OT educated on options to obtain and measured Lindsey [name] for appropriate sling.  Daughter plans to order.   ? ?Response to Treatment: ?See Plan/clinical impression below. ? ? ? ? OT Education - 12/14/21 0840   ? ? Education Details R shoulder sling for sublux to use when ambulating   ? Person(s) Educated Lindsey [name];Child(ren)   ? Methods Explanation;Verbal  cues   ? Comprehension Verbal cues required;Need further instruction;Verbalized understanding   ? ?  ?  ? ?  ? ? ? OT Short Term Goals - 11/30/21 0905   ? ?  ? OT SHORT TERM GOAL #1  ? Title Lindsey [name] and caregiver will be independent with HEP for RUE   ? Baseline Eval: No current program. 10th visit: Caregivers  report being comfortable with exercises at home.   ? Time 6   ? Period Weeks   ? Status On-going   ? Target Date 12/09/21   ? ?  ?  ? ?  ? ? ? ? OT Long Term Goals - 11/30/21 0908   ? ?  ? OT LONG TERM GOAL #1  ? Title Lindsey [name]. will engage engage the RUE as a gross assist/atabilizer during  ADLs 100% of the time   ? Baseline Eval: Lindsey [name]. does not currently engage her RUE during ADLs. 10th visit: Lindsey [name]. continues to need to work towards using her RUE as a gross stabilizer.   ? Time 12   ? Period Weeks   ? Status On-going   ? Target Date 01/20/22   ?  ? OT LONG TERM GOAL #2  ? Title Lindsey [name]. will perform UE dressing using one armed dressing techniques   ? Baseline Eval: MaxA, 10th visit: ModA   ? Time 12   ? Period Weeks   ? Status On-going   ? Target Date 01/20/22   ?  ? OT LONG TERM GOAL #3  ? Title Lindsey [name]. will require minA to stand and hike pants   ? Baseline Eval: MaxA, 10th visit: ModA   ? Time 12   ? Period Weeks   ? Status On-going   ? Target Date 01/20/22   ?  ? OT LONG TERM GOAL #4  ? Title Lindsey [name]. will perform LE dressing with minA   ? Baseline Eval: MaxA 10th visit: Lindsey [name]. requires MaxA for donning her brace, shoes, and socks. ModA with pants.   ? Time 12   ? Period Weeks   ? Status On-going   ? Target Date 01/21/22   ?  ? OT LONG TERM GOAL #5  ? Title Lindsey [name]. will improve FOTO score by 2 Lindsey [name]. to reflect funcational change   ? Baseline Eval: FOTO score: 12, 10th visit: FOTO score 27   ? Time 12   ? Period Weeks   ? Status On-going   ? Target Date 01/20/22   ?  ? OT LONG TERM GOAL #6  ? Title Lindsey [name] will transfer to bedside commode or toilet with supervision.   ? ?  ?  ? ?  ? ? ? ? Plan - 12/14/21 0852   ? ? Clinical Impression Statement Lindsey [name] making steady progress with sit to stand and step pivot transfers to simulate commode transfers at home; performed all transfers today with min guard and hemiwalker with less tactile cuing for forward lean during ascent from sitting as compared to last session.  Lindsey [name] continues to progress with dynamic standing tasks, requiring min guard-min A to maintain balance during standing ADLs.  Measured for RUE sling for sublux today; daughter plans to order.  Lindsey [name] will continue to benefit from OT for reducing stiffness in R shoulder joint in order to ease caregiver assist with UB ADLs, ADL  training, tone reduction strategies, and functional transfer training and increasing dynamic standing balance in order to maximize indep with daily tasks and reduce caregiver burden in the home.   ?  OT Occupational Profile and History Detailed Assessment- Review of Records and additional review of physical, cognitive, psychosocial history related to current functional performance   ? Occupational performance deficits (Please refer to evaluation for details): Leisure;IADL's;ADL's   ? Body Structure / Function / Physical Skills ADL;IADL;FMC;ROM;UE functional use;Strength;Decreased knowledge of use of DME;Coordination   ? Rehab Potential Fair   ? Clinical Decision Making Several treatment options, min-mod task modification necessary   ? Comorbidities Affecting Occupational Performance: May have comorbidities impacting occupational performance   ? Modification or Assistance to Complete Evaluation  Min-Moderate modification of tasks or assist with assess necessary to complete eval   ? OT Frequency 2x / week   ? OT Duration 12 weeks   ? OT Treatment/Interventions Self-care/ADL training;Neuromuscular education;Electrical Stimulation;Lindsey [name]/family education;Therapeutic activities;Passive range of motion;Therapist, nutritional;Therapeutic exercise;DME and/or AE instruction;Moist Heat   ? Consulted and Agree with Plan of Care Lindsey [name]   ? ?  ?  ? ?  ? ? ?Lindsey [name] will benefit from skilled therapeutic intervention in order to improve the following deficits and impairments:   ?Body Structure / Function / Physical Skills: ADL, IADL, FMC, ROM, UE functional use, Strength, Decreased knowledge of use of DME, Coordination ?  ?  ? ? ?Visit Diagnosis: ?Muscle weakness (generalized) ? ?Other lack of coordination ? ? ? ?Problem List ?Lindsey [name] Active Problem List  ? Diagnosis Date Noted  ? Elevated AST (SGOT) 10/22/2021  ? 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 Cerebral embolism with cerebral infarct

## 2021-12-16 ENCOUNTER — Ambulatory Visit: Payer: Medicare Other | Admitting: Speech Pathology

## 2021-12-16 ENCOUNTER — Encounter: Payer: Self-pay | Admitting: Occupational Therapy

## 2021-12-16 ENCOUNTER — Ambulatory Visit: Payer: Medicare Other

## 2021-12-16 ENCOUNTER — Other Ambulatory Visit: Payer: Self-pay

## 2021-12-16 ENCOUNTER — Ambulatory Visit: Payer: Medicare Other | Admitting: Occupational Therapy

## 2021-12-16 DIAGNOSIS — R4701 Aphasia: Secondary | ICD-10-CM | POA: Diagnosis not present

## 2021-12-16 NOTE — Therapy (Signed)
[place] ?[facility] ?[place], [place], [address] ?Phone: [phone]   Fax:  [phone] ? ?Speech Language Pathology Treatment ? ?Patient Details  ?Name: Lindsey [name] ?MRN: [id number] ?Date of Birth: [birth year] ?Referring Provider (SLP): Dr. Ranell Patrick ? ? ?Encounter Date: 12/16/2021 ? ? End of Session - 12/16/21 1344   ? ? Visit Number 11   ? Number of Visits 25   ? Date for SLP Re-Evaluation 01/26/22   ? Authorization Type Medicare/ medicaid secondary   ? SLP Start Time 1000   ? SLP Stop Time  1100   ? SLP Time Calculation (min) 60 min   ? Activity Tolerance Patient tolerated treatment well   ? ?  ?  ? ?  ? ? ?Past Medical History:  ?Diagnosis Date  ? Aphasia   ? 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 CVA (cerebral vascular accident) [facility])   ? Diverticulosis   ? History of ischemic left MCA stroke   ? Hypertension   ? Pain due to onychomycosis of toenails of both feet   ? 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 Uterine prolapse   ? ? ?No past surgical history on file. ? ?There were no vitals filed for this visit. ? ? Subjective Assessment - 12/16/21 1326   ? ? Subjective Reports she is "fine" today using Lingraphica   ? Currently in Pain? No/denies   ? ?  ?  ? ?  ? ? ? ? ? ? ? ? ADULT SLP TREATMENT - 12/16/21 1327   ? ?  ? General Information  ? Behavior/Cognition Alert;Cooperative;Pleasant mood   ? HPI Patient is a 66 y.o. female with prior history of CVA in 2021 with residual aphasia (using Lingraphica AAC device to assist with communication as of 08/2021) referred by Dr. Ranell Patrick after recent hospital admission when she was found to have new L AICA infarct complicated by pyelonephritis. Admitted to [facility] and then CIR 12/18-12/30/22. MRI 09/22/21 showed acute moderate size L AICA cerebellar infarct, underlying extensive chronic left MCA encephalomalacia related to 2021 infarct, left brainstem Wallerian  degeneration and chronic left PICA cerebellar infarcts.   ?  ? Cognitive-Linquistic Treatment  ? Treatment focused on Aphasia;Apraxia   ? Skilled Treatment With min-mod cues, patient selected icons to answer questions re: feelings and pain, and inquire about therapist. Personalized several existing icons using photos taken on pt's device to promote recognition (requests for bathroom, bed, shower, as well as family members). Continue to educate  grandson that using real images vs abstract icons will support pt in making requests/needs known. Patient answered simple personal questions re: family members, food choices with mod A (navigating categories/back button). Functional imitation: Pt imitated 1-2 syllable names which were recorded on her device for Mom and daughter Natividad Brood).   ?  ? Assessment / Recommendations / Plan  ? Plan Continue with current plan of care   ?  ? Progression Toward Goals  ? Progression toward goals Progressing toward goals   ? ?  ?  ? ?  ? ? ? SLP Education - 12/16/21 1343   ? ? Education Details take photos of pt's environment to help reduce amount of abstract images on her device   ? Person(s) Educated Patient;Other (comment)   grandson  ? Methods Explanation   ? Comprehension Verbalized understanding   ? ?  ?  ? ?  ? ? ? SLP Short  Term Goals - 12/13/21 0924   ? ?  ? SLP SHORT TERM GOAL #1  ? Title Pt will answer personally relevant Y/N questions in context using multimodal communication or AAC if necessary, 75% accuracy.   ? Time 10   ? Period --   sessions  ? Status Achieved   ?  ? SLP SHORT TERM GOAL #2  ? Title Given verbal or visual prompt, pt will correctly ID written word/object/icon in field of 4-6 to communicate wants/needs with 75% accuracy given occasional mod A over 2 sessions   ? Baseline F:3 12/09/21   ? Time 10   ? Period --   visits  ? Status Not Met   ?  ? SLP SHORT TERM GOAL #3  ? Title Pt will verbally approximate 3 significant words for 5/10 trials given occasional fading to  rare mod A over 2 sessions   ? Time 10   ? Period --   sessions  ? Status Achieved   ? ?  ?  ? ?  ? ? ? SLP Long Term Goals - 12/13/21 0930   ? ?  ? SLP LONG TERM GOAL #1  ? Title Pt will use multimodal communication system with mod assist to express wants/needs given >2 choices over 2 sessions for 4/5 opportunities   ? Time 12   ? Period Weeks   ? Status On-going   ?  ? SLP LONG TERM GOAL #2  ? Title Caregiver will demonstrate appropriate use of supported conversation and use of multimodal supports to aid pt's comprehension of personally relevant topics, over 4 sessions.   ? Time 12   ? Period Weeks   ? Status On-going   ?  ? SLP LONG TERM GOAL #3  ? Title Caregiver will appropriately cue patient when apraxic/aphasic errors occurs for 4/5 opportunities over 2 sessions   ? Time 12   ? Period Weeks   ? Status On-going   ? ?  ?  ? ?  ? ? ? Plan - 12/16/21 1344   ? ? Clinical Impression Statement Lindsey [name] presents with severe aphasia and severe verbal apraxia, chronic deficits exacerbated by new CVA in December 2022. Pt continues to improve ability to express feelings and pain in structured settings (with therapists assisting) by selecting icons on device. Patient scrolling/selecting icons more indepedently but requires ongoing training/cues for navigation between categories. I continue to recommend skilled ST with focus on functional communication to improve ability to communicate wants/needs, reduce frustration/social isolation and improve quality of life.   ? Speech Therapy Frequency 2x / week   ? Duration 12 weeks   ? Treatment/Interventions Compensatory strategies;Patient/family education;Functional tasks;Cueing hierarchy;Environmental controls;Cognitive reorganization;Multimodal communcation approach;Language facilitation;Compensatory techniques;Internal/external aids;SLP instruction and feedback;Other (comment)   ? Potential to [place]   ? Potential Considerations Severity of impairments;Previous level  of function   ? SLP Home Exercise Plan bring Lingraphica to next session   ? Consulted and Agree with Plan of Care Patient;Family member/caregiver   ? Family Member Consulted daughter   ? ?  ?  ? ?  ? ? ?Patient will benefit from skilled therapeutic intervention in order to improve the following deficits and impairments:   ?Aphasia ? ?Verbal apraxia ? ? ? ?Problem List ?Patient Active Problem List  ? Diagnosis Date Noted  ? Elevated AST (SGOT) 10/22/2021  ? 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 Renal  artery thrombosis ([place])   ? 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 Cerebral embolism with cerebral infarction 09/23/2021  ? Pyelonephritis 09/19/2021  ? Pain due to onychomycosis of toenails of both feet 11/19/2020  ? Normocytic anemia 05/31/2020  ? Acute ischemic left MCA stroke ([place]) 04/30/2020  ? Right hemiplegia ([place]) 04/30/2020  ? Cerebrovascular accident [facility]) 04/21/2020  ? Atrial fibrillation with RVR ([place]) 04/21/2020  ? Essential hypertension 04/21/2020  ? Alcohol abuse 04/21/2020  ? ?Deneise Lever, MS, CCC-SLP ?Speech-Language Pathologist ?[phone] ? ?Aliene Altes, CCC-SLP ?12/16/2021, 1:47 PM ? ?[place] ?[facility] ?[place], [place], [address] ?Phone: [phone]   Fax:  [phone] ? ? ?Name: Lindsey [name] ?MRN: [id number] ?Date of Birth: January 03, 1956 ? ?

## 2021-12-16 NOTE — Therapy (Signed)
[place] ?[facility] ?[place], [place], [address] ?Phone: [phone]   Fax:  [phone] ? ?Occupational Therapy Treatment ? ?Patient Details  ?Name: Lindsey [name] ?MRN: [id number] ?Date of Birth: [birth year] ?Referring Provider (OT): Dr. Ranell Patrick ? ? ?Encounter Date: 12/16/2021 ? ? OT End of Session - 12/16/21 0922   ? ? Visit Number 15   ? Number of Visits 24   ? Date for OT Re-Evaluation 01/20/22   ? Authorization Type Progress report period starting 12/02/2021   ? OT Start Time 0840   ? OT Stop Time 0915   ? OT Time Calculation (min) 35 min   ? Activity Tolerance Patient tolerated treatment well   ? Behavior During Therapy [facility] for tasks assessed/performed   ? ?  ?  ? ?  ? ? ?Past Medical History:  ?Diagnosis Date  ? Aphasia   ? 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 CVA (cerebral vascular accident) [facility])   ? Diverticulosis   ? History of ischemic left MCA stroke   ? Hypertension   ? Pain due to onychomycosis of toenails of both feet   ? 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 Uterine prolapse   ? ? ?History reviewed. No pertinent surgical history. ? ?There were no vitals filed for this visit. ? ? Subjective Assessment - 12/16/21 0922   ? ? Subjective  Pt present with daughter today.   ? Patient is accompanied by: Family member   ? Pertinent History Pt. is a 66 y.o. 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 Pt. has Right sided weakness, and receptive, and expressive aphasia. Pt. has had a previous CVA with right sided weakness in July 2021. PMHx includes: AFib, Renal Artery thrombosis, situational depression, HTN, Pyelnephritis, Seizure activity, COnstipation, AKI. Vitamin D deficiency, and urinary incontinence/   ? Currently in Pain? No/denies   ? ?  ?  ? ?  ? ?OT TREATMENT   ?  ?Therapeutic Exercise: ?  ?Pt. tolerated PROM in  all joint ranges of the RUE. Pt. Tolerated Passive  scapular elevation, depression, abd/adduction/rotation, shoulder flexion, abduction, horizontal abduction, elbow flexion, and extension, forearm supination, and pronation, wrist extension, digit MP, PIP, and DIP flexion, extension, and thumb radial, and palmar abduction. Reviewed self-ROM with the pt. ? ?Self-care: ? ?Pt. Worked on toileting transfers using a hemi walker.  ?  ?Pt. was late for the session this morning. Pt. required cues, and minA to stand, and minA for turning. Pt required increased assist,a nd cues to back up to the toilet. Pt. continues to present with increased RUE, and hand flexor tone, and tightness. Pt. continues to work on normalizing tone, and facilitating functional volitional movement in the RUE in order to assist with self dressing, ADLs, and transfers.  ?  ? ?  ? ? ? ? ? ? ? ? ? ? ? ? ? ? ? ? ? ? ? ? OT Education - 12/16/21 0922   ? ? Education Details toileting transfers   ? Person(s) Educated Patient;Child(ren)   ? Methods Explanation;Verbal cues   ? Comprehension Verbal cues required;Need further instruction;Verbalized understanding   ? ?  ?  ? ?  ? ? ? OT Short Term Goals - 11/30/21 0905   ? ?  ? OT SHORT TERM GOAL #1  ? Title Pt and caregiver will be  independent with HEP for RUE   ? Baseline Eval: No current program. 10th visit: Caregivers  report being comfortable with exercises at home.   ? Time 6   ? Period Weeks   ? Status On-going   ? Target Date 12/09/21   ? ?  ?  ? ?  ? ? ? ? OT Long Term Goals - 11/30/21 0908   ? ?  ? OT LONG TERM GOAL #1  ? Title Pt. will engage engage the RUE as a gross assist/atabilizer during ADLs 100% of the time   ? Baseline Eval: pt. does not currently engage her RUE during ADLs. 10th visit: Pt. continues to need to work towards using her RUE as a gross stabilizer.   ? Time 12   ? Period Weeks   ? Status On-going   ? Target Date 01/20/22   ?  ? OT LONG TERM GOAL #2  ? Title Pt. will perform UE  dressing using one armed dressing techniques   ? Baseline Eval: MaxA, 10th visit: ModA   ? Time 12   ? Period Weeks   ? Status On-going   ? Target Date 01/20/22   ?  ? OT LONG TERM GOAL #3  ? Title Pt. will require minA to stand and hike pants   ? Baseline Eval: MaxA, 10th visit: ModA   ? Time 12   ? Period Weeks   ? Status On-going   ? Target Date 01/20/22   ?  ? OT LONG TERM GOAL #4  ? Title Pt. will perform LE dressing with minA   ? Baseline Eval: MaxA 10th visit: Pt. requires MaxA for donning her brace, shoes, and socks. ModA with pants.   ? Time 12   ? Period Weeks   ? Status On-going   ? Target Date 01/21/22   ?  ? OT LONG TERM GOAL #5  ? Title Pt. will improve FOTO score by 2 pt. to reflect funcational change   ? Baseline Eval: FOTO score: 12, 10th visit: FOTO score 27   ? Time 12   ? Period Weeks   ? Status On-going   ? Target Date 01/20/22   ?  ? OT LONG TERM GOAL #6  ? Title Patient will transfer to bedside commode or toilet with supervision.   ? ?  ?  ? ?  ? ? ? ? ? ? ? ? Plan - 12/16/21 0923   ? ? Clinical Impression Statement Pt. was late for the session this morning. Pt. required cues, and minA to stand, and minA for turning. Pt required increased assist,a nd cues to back up to the toilet. Pt. continues to present with increased RUE, and hand flexor tone, and tightness. Pt. continues to work on normalizing tone, and facilitating functional volitional movement in the RUE in order to assist with self dressing, ADLs, and transfers.  ?   ? OT Occupational Profile and History Detailed Assessment- Review of Records and additional review of physical, cognitive, psychosocial history related to current functional performance   ? Occupational performance deficits (Please refer to evaluation for details): Leisure;IADL's;ADL's   ? Body Structure / Function / Physical Skills ADL;IADL;FMC;ROM;UE functional use;Strength;Decreased knowledge of use of DME;Coordination   ? Rehab Potential Fair   ? Clinical Decision  Making Several treatment options, min-mod task modification necessary   ? Comorbidities Affecting Occupational Performance: May have comorbidities impacting occupational performance   ? Modification or Assistance to Complete Evaluation  Min-Moderate modification of  tasks or assist with assess necessary to complete eval   ? OT Frequency 2x / week   ? OT Duration 12 weeks   ? OT Treatment/Interventions Self-care/ADL training;Neuromuscular education;Electrical Stimulation;Patient/family education;Therapeutic activities;Passive range of motion;Therapist, nutritional;Therapeutic exercise;DME and/or AE instruction;Moist Heat   ? Consulted and Agree with Plan of Care Patient   ? ?  ?  ? ?  ? ? ?Patient will benefit from skilled therapeutic intervention in order to improve the following deficits and impairments:   ?Body Structure / Function / Physical Skills: ADL, IADL, FMC, ROM, UE functional use, Strength, Decreased knowledge of use of DME, Coordination ?  ?  ? ? ?Visit Diagnosis: ?Muscle weakness (generalized) ? ? ? ?Problem List ?Patient Active Problem List  ? Diagnosis Date Noted  ? Elevated AST (SGOT) 10/22/2021  ? 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 Cerebral embolism with cerebral infarction 09/23/2021  ? Pyelonephritis 09/19/2021  ? Pain due to onychomycosis of toenails of both feet 11/19/2020  ? Normocytic anemia 05/31/2020  ? Acute ischemic left MCA stroke ([place]) 04/30/2020  ? Right hemiplegia ([place]) 04/30/2020  ? Cerebrovascular accident [facility]) 04/21/2020  ? Atrial fibrillation with RVR ([place]) 04/21/2020  ? Essential hypertension 04/21/2020  ? Alcohol abuse 04/21/2020  ? ?Harrel Carina, MS, OTR/L  ? ? Harrel Carina, OT ?12/16/2021, 10:22 AM ? ?[place] ?[facility] ?[place], [place], [address] ?Phone: [phone]   Fax:  [phone] ? ?Name: Lindsey [name] ?MRN: [id number] ?Date of Birth: 1956-03-25 ? ?

## 2021-12-16 NOTE — Therapy (Signed)
[place] ?[facility] ?[place], [place], [address] ?Phone: [phone]   Fax:  [phone] ? ?Physical Therapy Treatment ? ?Patient Details  ?Name: Lindsey [name] ?MRN: [id number] ?Date of Birth: [birth year] ?Referring Provider (PT): Raulkar, Clide Deutscher, MD ? ? ?Encounter Date: 12/16/2021 ? ? PT End of Session - 12/16/21 1203   ? ? Visit Number 7   ? Number of Visits 24   ? Date for PT Re-Evaluation 02/08/22   ? Authorization Time Period Cert through [id number]   ? Progress Note Due on Visit 10   ? PT Start Time [phone]   ? PT Stop Time 1000   ? PT Time Calculation (min) 44 min   ? Equipment Utilized During Treatment Gait belt   ? Activity Tolerance Patient tolerated treatment well   ? Behavior During Therapy [facility] for tasks assessed/performed   ? ?  ?  ? ?  ? ? ?Past Medical History:  ?Diagnosis Date  ? Aphasia   ? 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 CVA (cerebral vascular accident) [facility])   ? Diverticulosis   ? History of ischemic left MCA stroke   ? Hypertension   ? Pain due to onychomycosis of toenails of both feet   ? 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 Uterine prolapse   ? ? ?History reviewed. No pertinent surgical history. ? ?There were no vitals filed for this visit. ? ? Subjective Assessment - 12/16/21 0915   ? ? Subjective Pt reports no pain currently. Reports no falls. Pt reports HEP is going well.   ? Patient is accompained by: Family member   [name]  ? Pertinent History Pt was doing very well and was walking short distances wqith a cane prior to second stroke in december. At this point she is limited to wheelchair as her main means of mobility. Pt caregiver reports all detailed information. Pt has hesitation with the left lower extremity and significant weakness on the right lower extremity. Pt will take 2-3 steps when transfering. Pt caregiver assists with all transfers.Pt has shower with ledge to  steps over ledge when entering and utilizes bedside commode. Pt has 2 level home with chair lift for upstairs access. Pt would like to improve ransfers, improve neglect on her R side and improve her overall strength and mobility.Pt. is a 66 y.o. 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 Pt. has Right sided weakness, and receptive, and expressive aphasia. Pt. has had a previous CVA with right sided weakness in July 2021. PMHx includes: AFib, Renal Artery thrombosis, situational depression, HTN, Pyelnephritis, Seizure activity, COnstipation, AKI. Vitamin D deficiency, and urinary incontinence.   ? Limitations Standing;Walking;House hold activities   ? How long can you sit comfortably? n/a   ? How long can you stand comfortably? 2 min   ? How long can you walk comfortably? 3-4 steps   ? Patient Stated Goals Improve balance and functional movements   ? Currently in Pain? No/denies   ? ?  ?  ? ?  ? ? ?INTERVENTIONS:  ?  ?  ?Transfer training:  ?  ?Stand pivot transfer transport chair <> standard chair- mod a to mod a +2. Continued VC/TC for UE/LE positioning. PT blocking R knee. Pt with mild instability.  ?  ?Therex:  ?LAQ with 3# LLE and no weight RLE 2x15, with PT  providing TC and VC throughout. ? ? ?Seated hip march alt LE 2x 15 reps (AAROM right LE) with pt wearing 3# weight on LLE; PT continues to provide frequent cuing. ? ?STS 5x to [facility] with CGA-min a, cuing for weight-shift to RLE, PT blocking R knee. ?  ?Adductor squeezes with therapy ball - 10x with 3 sec holds B;  ?Adductor squeezes with therapy ball RLE only, PT providing resistance from L to R 2x10. ? ?Core strengthening in chair, PT providing manual resistance for 3-5 sec holds in the following directions: right and left sides, anterior-posterior, posterior-anterior and providing turning force B. ?  ?Heel slides with towel RLE 20x with PT assist, and and BTB hamstring curls LLE  2x15 ? ?  ?Education provided throughout session via VC/TC and demonstration to facilitate movement at target joints and correct muscle activation for all testing and exercises performed.  ? ? ? ? PT Education - 12/16/21 1202   ? ? Education Details exercise technique, body mechanics   ? Person(s) Educated Patient   ? Methods Explanation;Tactile cues;Demonstration;Verbal cues   ? Comprehension Verbalized understanding;Returned demonstration;Verbal cues required;Tactile cues required;Need further instruction   ? ?  ?  ? ?  ? ? ? PT Short Term Goals - 11/16/21 1207   ? ?  ? PT SHORT TERM GOAL #1  ? Title Patient will be independent in home exercise program to improve strength/mobility for better functional independence with ADLs.   ? Baseline no formal HEp for LE/balance at this time   ? Time 4   ? Period Weeks   ? Status New   ? Target Date 12/14/21   ? ?  ?  ? ?  ? ? ? ? PT Long Term Goals - 11/16/21 1207   ? ?  ? PT LONG TERM GOAL #1  ? Title Patient will increase FOTO score to equal to or greater than   41  to demonstrate statistically significant improvement in mobility and quality of life.   ? Baseline 16 on 2/14   ? Time 12   ? Period Weeks   ? Status New   ? Target Date 02/08/22   ?  ? PT LONG TERM GOAL #3  ? Title Patient  will complete five times sit to stand test in < 30 seconds with UE assistance indicating an increased LE strength and improved balance.   ? Baseline 58.47 s with UE assist and with A from PT for R LE positioning   ? Time 12   ? Period Weeks   ? Status New   ? Target Date 02/08/22   ?  ? PT LONG TERM GOAL #4  ? Title Patient will increase Berg Balance score by > 6 points to demonstrate decreased fall risk during functional activities.   ? Baseline 12 on 2/14   ? Time 12   ? Period Weeks   ? Status New   ? Target Date 02/08/22   ?  ? PT LONG TERM GOAL #5  ? Title Pt will transfer from seated position on table/chair to/from transport chair/WC in order to indicate improved transfer ability.   ?  Baseline Requires Min A for LE positioning and to prevent LOB.   ? Time 12   ? Period Weeks   ? Status New   ? Target Date 02/08/22   ? ?  ?  ? ?  ? ? ? ? ? ? ? ? Plan - 12/16/21 1209   ? ?  Clinical Impression Statement Pt with excellent motivation to participate in session. She tolerates interventions well without significant fatigue and without pain. PT continues POC to focus on LE strengthening and technique for transfers, sit to stands. Pt requires up to mod a x 2, with PT blocking pt's R knee in standing position. Although pt requires R knee block, she shows improved postural stability in standing compared to previous session (able to stand with upright posture without LOB). The pt will benefit from further skilled PT to improve strength, mobility and balance to increase ease and safety with transfers and ADLs.   ? Personal Factors and Comorbidities Age;Comorbidity 1;Comorbidity 2;Comorbidity 3+;Time since onset of injury/illness/exacerbation;Other   ? Comorbidities AFib, Renal Artery thrombosis, situational depression, HTN, Pyelnephritis, Seizure activity, COnstipation, AKI   ? Examination-Activity Limitations Bathing;Bed Mobility;Carry;Locomotion Level;Dressing;Sit;Squat;Stairs;Stand;Toileting;Transfers   ? Examination-Participation Restrictions Church;Community Activity;Laundry   ? Stability/Clinical Decision Making Evolving/Moderate complexity   ? Rehab Potential Fair   ? PT Frequency 2x / week   ? PT Duration 12 weeks   ? PT Treatment/Interventions ADLs/Self Care Home Management;Gait training;Stair training;Therapeutic activities;Therapeutic exercise;Balance training;Neuromuscular re-education;Patient/family education;Wheelchair mobility training;Manual techniques;Energy conservation;Dry needling;Passive range of motion;Taping;Spinal Manipulations;Joint Manipulations   ? PT Next Visit Plan Gait training, Transfer training, LE strengthening as appropriate.   ? PT Home Exercise Plan HEP: general LE and  postural exercises   ? Consulted and Agree with Plan of Care Patient   ? ?  ?  ? ?  ? ? ?Patient will benefit from skilled therapeutic intervention in order to improve the following deficits and impairments:  A

## 2021-12-20 ENCOUNTER — Ambulatory Visit: Payer: Medicare Other | Admitting: Speech Pathology

## 2021-12-20 ENCOUNTER — Ambulatory Visit: Payer: Medicare Other | Admitting: Occupational Therapy

## 2021-12-23 ENCOUNTER — Ambulatory Visit: Payer: Medicare Other

## 2021-12-23 ENCOUNTER — Other Ambulatory Visit: Payer: Self-pay

## 2021-12-23 ENCOUNTER — Ambulatory Visit: Payer: Medicare Other | Admitting: Speech Pathology

## 2021-12-23 ENCOUNTER — Ambulatory Visit: Payer: Medicaid Other | Admitting: Podiatry

## 2021-12-23 DIAGNOSIS — R2681 Unsteadiness on feet: Secondary | ICD-10-CM

## 2021-12-23 DIAGNOSIS — M6281 Muscle weakness (generalized): Secondary | ICD-10-CM

## 2021-12-23 DIAGNOSIS — R278 Other lack of coordination: Secondary | ICD-10-CM

## 2021-12-23 DIAGNOSIS — R482 Apraxia: Secondary | ICD-10-CM

## 2021-12-23 DIAGNOSIS — R4701 Aphasia: Secondary | ICD-10-CM | POA: Diagnosis not present

## 2021-12-23 DIAGNOSIS — R262 Difficulty in walking, not elsewhere classified: Secondary | ICD-10-CM

## 2021-12-23 DIAGNOSIS — R269 Unspecified abnormalities of gait and mobility: Secondary | ICD-10-CM

## 2021-12-23 NOTE — Therapy (Signed)
[place] ?[facility] ?[place], [place], [address] ?Phone: [phone]   Fax:  [phone] ? ?Speech Language Pathology Treatment ? ?Patient Details  ?Name: Lindsey [name] ?MRN: [id number] ?Date of Birth: 19-Nov-1955 ?Referring Provider (SLP): Dr. Ranell Patrick ? ? ?Encounter Date: 12/23/2021 ? ? End of Session - 12/23/21 1458   ? ? Visit Number 12   ? Number of Visits 25   ? Date for SLP Re-Evaluation 01/26/22   ? Authorization Type Medicare/ medicaid secondary   ? SLP Start Time 1002   ? SLP Stop Time  1100   ? SLP Time Calculation (min) 58 min   ? Activity Tolerance Patient tolerated treatment well   ? ?  ?  ? ?  ? ? ?Past Medical History:  ?Diagnosis Date  ? Aphasia   ? 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 CVA (cerebral vascular accident) [facility])   ? Diverticulosis   ? History of ischemic left MCA stroke   ? Hypertension   ? Pain due to onychomycosis of toenails of both feet   ? 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 Uterine prolapse   ? ? ?No past surgical history on file. ? ?There were no vitals filed for this visit. ? ? Subjective Assessment - 12/23/21 1329   ? ? Subjective Used Lingraphica to ask graduate clinician "How are you"   ? Patient is accompained by: Family member   [name]  ? Currently in Pain? No/denies   ? Pain Score 0-No pain   ? ?  ?  ? ?  ? ? ? ? ? ? ? ? ADULT SLP TREATMENT - 12/23/21 1333   ? ?  ? General Information  ? Behavior/Cognition Alert;Cooperative;Pleasant mood   ? HPI Patient is a 66 y.o. female with prior history of CVA in 2021 with residual aphasia (using Lingraphica AAC device to assist with communication as of 08/2021) referred by Dr. Ranell Patrick after recent hospital admission when she was found to have new L AICA infarct complicated by pyelonephritis. Admitted to [facility] and then CIR 12/18-12/30/22. MRI 09/22/21 showed acute moderate size L AICA cerebellar infarct,  underlying extensive chronic left MCA encephalomalacia related to 2021 infarct, left brainstem Wallerian degeneration and chronic left PICA cerebellar infarcts.   ?  ? Cognitive-Linquistic Treatment  ? Treatment focused on Aphasia;Apraxia   ? Skilled Treatment Pt used Lingraphica to express "I'm fine." With min-mod cues pt navigated device to select icons related to her night routine with her granddaughter. Pt participated in recording her voice for personalization of existing icons (family member's names, greeting for therapist). During task, pt repeated names of family members with ~60% accuracy, improved to ~85% acc with mod cues for apraxia ("watch me"). Pt educated on selecting an icon to hear it when she is having difficulty saying it. With min-mod cues, pt answered questions about wants/needs (shower, brushing hair, going to bed).   ?  ? Assessment / Recommendations / Plan  ? Plan Continue with current plan of care   ?  ? Progression Toward Goals  ? Progression toward goals Progressing toward goals   ? ?  ?  ? ?  ? ? ? SLP Education - 12/23/21 1415   ? ? Education Details Apraxia compensations, using device to express wants/needs   ? Person(s) Educated Patient;Other (comment)   Grandson  ? Methods Explanation   ? Comprehension Verbalized understanding   ? ?  ?  ? ?  ? ? ?  SLP Short Term Goals - 12/13/21 0924   ? ?  ? SLP SHORT TERM GOAL #1  ? Title Pt will answer personally relevant Y/N questions in context using multimodal communication or AAC if necessary, 75% accuracy.   ? Time 10   ? Period --   sessions  ? Status Achieved   ?  ? SLP SHORT TERM GOAL #2  ? Title Given verbal or visual prompt, pt will correctly ID written word/object/icon in field of 4-6 to communicate wants/needs with 75% accuracy given occasional mod A over 2 sessions   ? Baseline F:3 12/09/21   ? Time 10   ? Period --   visits  ? Status Not Met   ?  ? SLP SHORT TERM GOAL #3  ? Title Pt will verbally approximate 3 significant words for 5/10  trials given occasional fading to rare mod A over 2 sessions   ? Time 10   ? Period --   sessions  ? Status Achieved   ? ?  ?  ? ?  ? ? ? SLP Long Term Goals - 12/13/21 0930   ? ?  ? SLP LONG TERM GOAL #1  ? Title Pt will use multimodal communication system with mod assist to express wants/needs given >2 choices over 2 sessions for 4/5 opportunities   ? Time 12   ? Period Weeks   ? Status On-going   ?  ? SLP LONG TERM GOAL #2  ? Title Caregiver will demonstrate appropriate use of supported conversation and use of multimodal supports to aid pt's comprehension of personally relevant topics, over 4 sessions.   ? Time 12   ? Period Weeks   ? Status On-going   ?  ? SLP LONG TERM GOAL #3  ? Title Caregiver will appropriately cue patient when apraxic/aphasic errors occurs for 4/5 opportunities over 2 sessions   ? Time 12   ? Period Weeks   ? Status On-going   ? ?  ?  ? ?  ? ? ? Plan - 12/23/21 1459   ? ? Clinical Impression Statement Malaak presents with severe aphasia and severe verbal apraxia, chronic deficits exacerbated by new CVA in December 2022. Pt engaged and participating in personalization of device. Appears highly motivated and smiles when hearing her own voice paired with images on her device; this in turn is noted to increase spontaneous output. Pt continues to benefit from training and gestural cues to navigate between categories. With assistance, pt increasing use of device to answer simple personal questions in therapy setting. I continue to recommend skilled ST with focus on functional communication to improve ability to communicate wants/needs, reduce frustration/social isolation and improve quality of life.   ? Speech Therapy Frequency 2x / week   ? Duration 12 weeks   ? Treatment/Interventions Compensatory strategies;Patient/family education;Functional tasks;Cueing hierarchy;Environmental controls;Cognitive reorganization;Multimodal communcation approach;Language facilitation;Compensatory  techniques;Internal/external aids;SLP instruction and feedback;Other (comment)   ? Potential to [place]   ? Potential Considerations Severity of impairments;Previous level of function   ? SLP Home Exercise Plan bring Lingraphica to next session   ? Consulted and Agree with Plan of Care Patient;Family member/caregiver   ? Family Member Consulted daughter   ? ?  ?  ? ?  ? ? ?Patient will benefit from skilled therapeutic intervention in order to improve the following deficits and impairments:   ?Aphasia ? ?Verbal apraxia ? ? ? ?Problem List ?Patient Active Problem List  ? Diagnosis Date Noted  ?  Elevated AST (SGOT) 10/22/2021  ? 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 Cerebral embolism with cerebral infarction 09/23/2021  ? Pyelonephritis 09/19/2021  ? Pain due to onychomycosis of toenails of both feet 11/19/2020  ? Normocytic anemia 05/31/2020  ? Acute ischemic left MCA stroke ([place]) 04/30/2020  ? Right hemiplegia ([place]) 04/30/2020  ? Cerebrovascular accident [facility]) 04/21/2020  ? Atrial fibrillation with RVR ([place]) 04/21/2020  ? Essential hypertension 04/21/2020  ? Alcohol abuse 04/21/2020  ?Caryl Comes [facility] SLP Student   ? ?[facility], [place] ?12/23/2021, 3:01 PM ? ?[place] ?[facility] ?[place], [place], [address] ?Phone: [phone]   Fax:  [phone] ? ? ?Name: Yocheved Depner ?MRN: [id number] ?Date of Birth: 10/03/1956 ? ?

## 2021-12-23 NOTE — Therapy (Signed)
[place] ?[facility] ?[address] ?[place], [place], [address] ?Phone: [phone]   Fax:  [phone] ? ?Physical Therapy Treatment ? ?Patient Details  ?Name: Lindsey [name] ?MRN: [id number] ?Date of Birth: May 16, 1956 ?Referring Provider (PT): Raulkar, Drema Pry, MD ? ? ?Encounter Date: 12/23/2021 ? ? PT End of Session - 12/23/21 1544   ? ? Visit Number 8   ? Number of Visits 24   ? Date for PT Re-Evaluation 02/08/22   ? Authorization Time Period Cert through 02/08/22   ? Progress Note Due on Visit 10   ? PT Start Time 1145   ? PT Stop Time 1227   ? PT Time Calculation (min) 42 min   ? Equipment Utilized During Treatment Gait belt   ? Activity Tolerance Patient tolerated treatment well   ? Behavior During Therapy [facility] for tasks assessed/performed   ? ?  ?  ? ?  ? ? ?Past Medical History:  ?Diagnosis Date  ? Aphasia   ? 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 CVA (cerebral vascular accident) [facility])   ? Diverticulosis   ? History of ischemic left MCA stroke   ? Hypertension   ? Pain due to onychomycosis of toenails of both feet   ? 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HCC)   ? Uterine prolapse   ? ? ?History reviewed. No pertinent surgical history. ? ?There were no vitals filed for this visit. ? ? Subjective Assessment - 12/23/21 1543   ? ? Subjective Patient reports fatigued after working with OT on transfers using her Right arm.   ? Patient is accompained by: Family member   [name]  ? Pertinent History Pt was doing very well and was walking short distances wqith a cane prior to second stroke in december. At this point she is limited to wheelchair as her main means of mobility. Pt caregiver reports all detailed information. Pt has hesitation with the left lower extremity and significant weakness on the right lower extremity. Pt will take 2-3 steps when transfering. Pt caregiver assists with all transfers.Pt has shower with ledge  to steps over ledge when entering and utilizes bedside commode. Pt has 2 level home with chair lift for upstairs access. Pt would like to improve ransfers, improve neglect on her R side and improve her overall strength and mobility.Pt. is a 66 y.o. 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 Pt. has Right sided weakness, and receptive, and expressive aphasia. Pt. has had a previous CVA with right sided weakness in July 2021. PMHx includes: AFib, Renal Artery thrombosis, situational depression, HTN, Pyelnephritis, Seizure activity, COnstipation, AKI. Vitamin D deficiency, and urinary incontinence.   ? Limitations Standing;Walking;House hold activities   ? How long can you sit comfortably? n/a   ? How long can you stand comfortably? 2 min   ? How long can you walk comfortably? 3-4 steps   ? Patient Stated Goals Improve balance and functional movements   ? Currently in Pain? No/denies   ? ?  ?  ? ?  ?Gait training: Patient ambulated around 35 feet and later 25 feet using hemiwalker on left, min assist for trunk support and max VC for step cadence. Patient demonstrated short step to gait with some difficulty advancing right LE.  ? ?Therex:  ?LAQ with 3# LLE and no weight RLE 2x15, with PT providing TC and VC  throughout. ?  ?  ?Seated hip march alt LE 2x 15 reps (AAROM right LE) with pt wearing 3# weight on LLE; PT continues to provide frequent cuing. ?  ?  ?Adductor squeezes with therapy ball - 2 sets of 10x with 3 sec holds B  ? ?  ?  ?RTB hamstring curls BLE x15 reps.  ? ?Education provided throughout session via VC/TC and demonstration to facilitate movement at target joints and correct muscle activation for all testing and exercises performed.  ?  ? ? ? ? ? ? ? ? ? ? ? ? ? ? ? ? ? ? ? ? ? ? ? ? ? ? ? ? PT Education - 12/23/21 1543   ? ? Education Details Exercise/transfer/gait technique   ? Person(s) Educated Patient   ? Methods  Explanation;Demonstration;Tactile cues;Verbal cues   ? Comprehension Verbalized understanding;Returned demonstration;Verbal cues required;Tactile cues required;Need further instruction   ? ?  ?  ? ?  ? ? ? PT Short Term Goals - 11/16/21 1207   ? ?  ? PT SHORT TERM GOAL #1  ? Title Patient will be independent in home exercise program to improve strength/mobility for better functional independence with ADLs.   ? Baseline no formal HEp for LE/balance at this time   ? Time 4   ? Period Weeks   ? Status New   ? Target Date 12/14/21   ? ?  ?  ? ?  ? ? ? ? PT Long Term Goals - 11/16/21 1207   ? ?  ? PT LONG TERM GOAL #1  ? Title Patient will increase FOTO score to equal to or greater than   41  to demonstrate statistically significant improvement in mobility and quality of life.   ? Baseline 16 on 2/14   ? Time 12   ? Period Weeks   ? Status New   ? Target Date 02/08/22   ?  ? PT LONG TERM GOAL #3  ? Title Patient  will complete five times sit to stand test in < 30 seconds with UE assistance indicating an increased LE strength and improved balance.   ? Baseline 58.47 s with UE assist and with A from PT for R LE positioning   ? Time 12   ? Period Weeks   ? Status New   ? Target Date 02/08/22   ?  ? PT LONG TERM GOAL #4  ? Title Patient will increase Berg Balance score by > 6 points to demonstrate decreased fall risk during functional activities.   ? Baseline 12 on 2/14   ? Time 12   ? Period Weeks   ? Status New   ? Target Date 02/08/22   ?  ? PT LONG TERM GOAL #5  ? Title Pt will transfer from seated position on table/chair to/from transport chair/WC in order to indicate improved transfer ability.   ? Baseline Requires Min A for LE positioning and to prevent LOB.   ? Time 12   ? Period Weeks   ? Status New   ? Target Date 02/08/22   ? ?  ?  ? ?  ? ? ? ? ? ? ? ? Plan - 12/23/21 1544   ? ? Clinical Impression Statement Patient presented with good motivation today and responded well to gait cues for improved functional  mobility. She demonstrated fatigue as limiting factor and required significant physical assist and VC for safe mobility. The pt will benefit from further  skilled PT to improve strength, mobility and balance to increase ease and safety with transfers and ADLs   ? Personal Factors and Comorbidities Age;Comorbidity 1;Comorbidity 2;Comorbidity 3+;Time since onset of injury/illness/exacerbation;Other   ? Comorbidities AFib, Renal Artery thrombosis, situational depression, HTN, Pyelnephritis, Seizure activity, COnstipation, AKI   ? Examination-Activity Limitations Bathing;Bed Mobility;Carry;Locomotion Level;Dressing;Sit;Squat;Stairs;Stand;Toileting;Transfers   ? Examination-Participation Restrictions Church;Community Activity;Laundry   ? Stability/Clinical Decision Making Evolving/Moderate complexity   ? Rehab Potential Fair   ? PT Frequency 2x / week   ? PT Duration 12 weeks   ? PT Treatment/Interventions ADLs/Self Care Home Management;Gait training;Stair training;Therapeutic activities;Therapeutic exercise;Balance training;Neuromuscular re-education;Patient/family education;Wheelchair mobility training;Manual techniques;Energy conservation;Dry needling;Passive range of motion;Taping;Spinal Manipulations;Joint Manipulations   ? PT Next Visit Plan Gait training, Transfer training, LE strengthening as appropriate.   ? PT Home Exercise Plan HEP: general LE and postural exercises   ? Consulted and Agree with Plan of Care Patient   ? ?  ?  ? ?  ? ? ?Patient will benefit from skilled therapeutic intervention in order to improve the following deficits and impairments:  Abnormal gait, Decreased activity tolerance, Decreased endurance, Decreased knowledge of use of DME, Decreased range of motion, Decreased strength, Hypomobility, Impaired flexibility, Difficulty walking, Decreased balance, Decreased coordination, Decreased knowledge of precautions, Decreased mobility, Decreased safety awareness ? ?Visit Diagnosis: ?Abnormality of  gait and mobility ? ?Difficulty in walking, not elsewhere classified ? ?Muscle weakness (generalized) ? ?Other lack of coordination ? ?Unsteadiness on feet ? ? ? ? ?Problem List ?Patient Active Problem List  ? Diagnosis

## 2021-12-24 NOTE — Therapy (Signed)
[place] ?[place], [place], [address] ?Phone: [phone]   Fax:  [phone] ? ?Occupational Therapy Treatment ? ?Patient Details  ?Name: Lindsey [name] ?MRN: [id number] ?Date of Birth: [birth year] ?Referring Provider (OT): Dr. Ranell Patrick ? ? ?Encounter Date: 12/23/2021 ? ? OT End of Session - 12/24/21 1646   ? ? Visit Number 16   ? Number of Visits 24   ? Date for OT Re-Evaluation 01/20/22   ? Authorization Type Progress report period starting 12/02/2021   ? OT Start Time 1100   ? OT Stop Time 1145   ? OT Time Calculation (min) 45 min   ? Equipment Utilized During Treatment transport chair   ? Activity Tolerance Patient tolerated treatment well   ? Behavior During Therapy [facility] for tasks assessed/performed   ? ?  ?  ? ?  ? ? ?Past Medical History:  ?Diagnosis Date  ? Aphasia   ? 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 CVA (cerebral vascular accident) [facility])   ? Diverticulosis   ? History of ischemic left MCA stroke   ? Hypertension   ? Pain due to onychomycosis of toenails of both feet   ? 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 Uterine prolapse   ? ? ?No past surgical history on file. ? ?There were no vitals filed for this visit. ? ? Subjective Assessment - 12/23/21 1207   ? ? Subjective  Pt able to say hello and verbalize OT's name unprompted today without using her communication device.   ? Patient is accompanied by: Family member   ? Pertinent History Pt. is a 66 y.o. 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 Pt. has Right sided weakness, and receptive, and expressive aphasia. Pt. has had a previous CVA with right sided weakness in July 2021. PMHx includes: AFib, Renal Artery thrombosis, situational depression, HTN, Pyelnephritis, Seizure activity, COnstipation, AKI. Vitamin D deficiency, and urinary incontinence/   ?  Limitations Right side weakness, reptive, and expressive ahasia   ? Patient Stated Goals To be able to dress herself.   ? ?  ?  ? ?  ?Occupational Therapy Treatment: ?Therapeutic Exercise: ?Performed PROM to R shoulder, elbow, forearm, wrist, hand, all planes, working to reduce tone and decrease stiffness in prep for functional transfers.  ? ?Self Care: ?Practiced sit to stand from chair with arm rests and mat table, multiple trials.  Initial min A with tactile cues for forward lean, progressed to min guard from chair and supv from mat table.  OT providing set up of R foot placement in prep for transfer.  Practiced step pivot transfers mat<>chair to simulate BSC transfers in the home; multiple trials completed all with min guard.  Incorporated WB to R hand for 4 transfer trials, with OT providing hand over hand assist to maintain positioning during sit to stand transfers.       ? ?Response to Treatment: ?See Plan/clinical impression below. ? ? ? OT Education - 12/23/21 1646   ? ? Education Details sit to stand transfer technique   ? Person(s) Educated Patient;Child(ren)   ? Methods Explanation;Verbal cues;Demonstration;Tactile cues   ? Comprehension Verbal cues required;Need further instruction;Returned demonstration;Tactile cues required   ? ?  ?  ? ?  ? ? ? OT Short Term Goals - 11/30/21 0905   ? ?  ? OT  SHORT TERM GOAL #1  ? Title Pt and caregiver will be independent with HEP for RUE   ? Baseline Eval: No current program. 10th visit: Caregivers  report being comfortable with exercises at home.   ? Time 6   ? Period Weeks   ? Status On-going   ? Target Date 12/09/21   ? ?  ?  ? ?  ? ? ? ? OT Long Term Goals - 11/30/21 0908   ? ?  ? OT LONG TERM GOAL #1  ? Title Pt. will engage engage the RUE as a gross assist/atabilizer during ADLs 100% of the time   ? Baseline Eval: pt. does not currently engage her RUE during ADLs. 10th visit: Pt. continues to need to work towards using her RUE as a gross stabilizer.   ? Time 12    ? Period Weeks   ? Status On-going   ? Target Date 01/20/22   ?  ? OT LONG TERM GOAL #2  ? Title Pt. will perform UE dressing using one armed dressing techniques   ? Baseline Eval: MaxA, 10th visit: ModA   ? Time 12   ? Period Weeks   ? Status On-going   ? Target Date 01/20/22   ?  ? OT LONG TERM GOAL #3  ? Title Pt. will require minA to stand and hike pants   ? Baseline Eval: MaxA, 10th visit: ModA   ? Time 12   ? Period Weeks   ? Status On-going   ? Target Date 01/20/22   ?  ? OT LONG TERM GOAL #4  ? Title Pt. will perform LE dressing with minA   ? Baseline Eval: MaxA 10th visit: Pt. requires MaxA for donning her brace, shoes, and socks. ModA with pants.   ? Time 12   ? Period Weeks   ? Status On-going   ? Target Date 01/21/22   ?  ? OT LONG TERM GOAL #5  ? Title Pt. will improve FOTO score by 2 pt. to reflect funcational change   ? Baseline Eval: FOTO score: 12, 10th visit: FOTO score 27   ? Time 12   ? Period Weeks   ? Status On-going   ? Target Date 01/20/22   ?  ? OT LONG TERM GOAL #6  ? Title Patient will transfer to bedside commode or toilet with supervision.   ? ?  ?  ? ?  ? ? ? Plan - 12/23/21 1656   ? ? Clinical Impression Statement Pt making steady gains with functional transfers, requiring fewer cues for anterior lean during ascent.  Pt tolerated WB to R hand during 4 sit to stand transfers this day, but requires hand over hand assist to keep hand positioned.    Pt will continue to benefit from OT for reducing stiffness in R shoulder joint in order to ease caregiver assist with UB ADLs, ADL training, tone reduction strategies, and functional transfer training and increasing dynamic standing balance in order to maximize indep with daily tasks and reduce caregiver burden in the home.   ? OT Occupational Profile and History Detailed Assessment- Review of Records and additional review of physical, cognitive, psychosocial history related to current functional performance   ? Occupational performance  deficits (Please refer to evaluation for details): Leisure;IADL's;ADL's   ? Body Structure / Function / Physical Skills ADL;IADL;FMC;ROM;UE functional use;Strength;Decreased knowledge of use of DME;Coordination   ? Rehab Potential Fair   ? Clinical Decision Making Several treatment  options, min-mod task modification necessary   ? Comorbidities Affecting Occupational Performance: May have comorbidities impacting occupational performance   ? Modification or Assistance to Complete Evaluation  Min-Moderate modification of tasks or assist with assess necessary to complete eval   ? OT Frequency 2x / week   ? OT Duration 12 weeks   ? OT Treatment/Interventions Self-care/ADL training;Neuromuscular education;Electrical Stimulation;Patient/family education;Therapeutic activities;Passive range of motion;Therapist, nutritional;Therapeutic exercise;DME and/or AE instruction;Moist Heat   ? Consulted and Agree with Plan of Care Patient   ? ?  ?  ? ?  ? ? ?Patient will benefit from skilled therapeutic intervention in order to improve the following deficits and impairments:   ?Body Structure / Function / Physical Skills: ADL, IADL, FMC, ROM, UE functional use, Strength, Decreased knowledge of use of DME, Coordination ?  ?  ? ? ?Visit Diagnosis: ?Apraxia ? ?Muscle weakness (generalized) ? ?Other lack of coordination ? ? ? ?Problem List ?Patient Active Problem List  ? Diagnosis Date Noted  ? Elevated AST (SGOT) 10/22/2021  ? 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 Cerebral embolism with cerebral infarction 09/23/2021  ? Pyelonephritis 09/19/2021  ? Pain due to onychomycosis of toenails of both feet 11/19/2020  ? Normocytic anemia 05/31/2020  ? Acute ischemic left MCA stroke ([place]) 04/30/2020  ?  Right hemiplegia ([place]) 04/30/2020  ? Cerebrovascular accident [facility]) 04/21/2020  ? Atrial fibrillation with RVR ([place]) 04/21/2020  ? Essential hypertension 04/21/2020  ? Alcohol abuse 04/21/2020  ? ?Emelda Brothers

## 2021-12-27 ENCOUNTER — Encounter: Payer: Self-pay | Admitting: Podiatry

## 2021-12-27 ENCOUNTER — Ambulatory Visit (INDEPENDENT_AMBULATORY_CARE_PROVIDER_SITE_OTHER): Payer: Medicare Other | Admitting: Podiatry

## 2021-12-27 ENCOUNTER — Other Ambulatory Visit: Payer: Self-pay

## 2021-12-27 DIAGNOSIS — D689 Coagulation defect, unspecified: Secondary | ICD-10-CM

## 2021-12-27 DIAGNOSIS — B351 Tinea unguium: Secondary | ICD-10-CM

## 2021-12-27 DIAGNOSIS — M79674 Pain in right toe(s): Secondary | ICD-10-CM | POA: Diagnosis not present

## 2021-12-27 DIAGNOSIS — M79675 Pain in left toe(s): Secondary | ICD-10-CM | POA: Diagnosis not present

## 2021-12-27 NOTE — Progress Notes (Signed)
This patient returns to my office for at risk foot care.  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  These nails are painful walking and wearing shoes.  This patient presents for at risk foot care today. She presents to the office in a wheelchair with a caregiver. ? ?General Appearance  Alert, conversant and in no acute stress. ? ?Vascular  Dorsalis pedis and posterior tibial  pulses are weakly  palpable  left.  Dorsalis pedis and posterior tibial pulses are absent right.  Capillary return is within normal limits  bilaterally. Cold swollen right foot. ? ?Neurologic  Senn-Weinstein monofilament wire test within normal limits  left. Muscle power within normal limits left. ? ?Nails Thick disfigured discolored nails with subungual debris  from hallux to fifth toes bilaterally. No evidence of bacterial infection or drainage bilaterally. ? ?Orthopedic  No limitations of motion  feet .  No crepitus or effusions noted.  No bony pathology or digital deformities noted. ? ?Skin  normotropic skin with no porokeratosis noted bilaterally.  No signs of infections or ulcers noted.    ? ?Onychomycosis  Pain in right toes  Pain in left toes ? ?Consent was obtained for treatment procedures.   Mechanical debridement of nails 1-5  bilaterally performed with a nail nipper.  Filed with dremel without incident.  ? ? ?Return office visit   3 months                   Told patient to return for periodic foot care and evaluation due to potential at risk complications. ? ? ?Gardiner Barefoot DPM   ?

## 2021-12-28 ENCOUNTER — Encounter: Payer: Self-pay | Admitting: Occupational Therapy

## 2021-12-28 ENCOUNTER — Ambulatory Visit: Payer: Medicare Other | Admitting: Speech Pathology

## 2021-12-28 ENCOUNTER — Ambulatory Visit: Payer: Medicare Other | Admitting: Occupational Therapy

## 2021-12-28 DIAGNOSIS — R4701 Aphasia: Secondary | ICD-10-CM

## 2021-12-28 DIAGNOSIS — M6281 Muscle weakness (generalized): Secondary | ICD-10-CM

## 2021-12-28 DIAGNOSIS — R482 Apraxia: Secondary | ICD-10-CM

## 2021-12-28 DIAGNOSIS — R278 Other lack of coordination: Secondary | ICD-10-CM

## 2021-12-28 NOTE — Therapy (Signed)
[place] [facility] ?[place], [place], [address] ?Phone: [phone]   Fax:  [phone] ? ?Occupational Therapy Treatment ? ?Patient Details  ?Name: Lindsey [name] ?MRN: [id number] ?Date of Birth: 01/18/1956 ?Referring Provider (OT): Dr. Ranell Patrick ? ? ?Encounter Date: 12/28/2021 ? ? OT End of Session - 12/28/21 1414   ? ? Visit Number 17   ? Number of Visits 24   ? Date for OT Re-Evaluation 01/20/22   ? Authorization Type Progress report period starting 12/02/2021   ? OT Start Time 1015   ? OT Stop Time 1100   ? OT Time Calculation (min) 45 min   ? Activity Tolerance Patient tolerated treatment well   ? Behavior During Therapy [facility] for tasks assessed/performed   ? ?  ?  ? ?  ? ? ?Past Medical History:  ?Diagnosis Date  ? Aphasia   ? 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 CVA (cerebral vascular accident) [facility])   ? Diverticulosis   ? History of ischemic left MCA stroke   ? Hypertension   ? Pain due to onychomycosis of toenails of both feet   ? 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 Uterine prolapse   ? ? ?History reviewed. No pertinent surgical history. ? ?There were no vitals filed for this visit. ? ? Subjective Assessment - 12/28/21 1413   ? ? Subjective  Pt. was present with her daughter today.   ? Patient is accompanied by: Family member   ? Pertinent History Pt. is a 66 y.o. 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 Pt. has Right sided weakness, and receptive, and expressive aphasia. Pt. has had a previous CVA with right sided weakness in July 2021. PMHx includes: AFib, Renal Artery thrombosis, situational depression, HTN, Pyelnephritis, Seizure activity, COnstipation, AKI. Vitamin D deficiency, and urinary incontinence/   ? Currently in Pain? No/denies   ? ?  ?  ? ?  ? ? ?OT TREATMENT   ? ?Self-care:  ? ?Pt. worked on transfers,  and functional mobility using a hemiwalker  in small spaces to simulate bathroom spaces in preparation for toileting transfers.   ? ?Therapeutic Exercise: ? ?Pt. tolerated PROM through all joint ranges of the RUE, and hand. Pt. Performed self-ROM to the right MPs, PIPs, and DIPs.  ? ?Pt.'s daughter reports that the pt.'s hand splint was left in the hot car, and became misshapen. Pt. Could benefit from an [employer] SoftPro resting hand splint for the right hand.  Pt./caregiver education was provided about the proper shoulder harness brace application secondary to right shoulder subluxation. Pt. Is improving with transfers. Pt. continues to require cues, and assist to advance her RLE, with increased assist required when turning to back up. Pt. Continues to work towards improving functional transfers for toileting. Plan to measure the pt. for a SoftPro resting hand splint. ? ? ? ? ? ? ? ? ? ? ? ? ? ? ? ? ? ? ? ? OT Education - 12/28/21 1413   ? ? Education Details sit to stand transfer technique   ? Person(s) Educated Patient;Child(ren)   ? Methods Explanation;Verbal cues;Demonstration;Tactile cues   ? Comprehension Verbal cues required;Need further instruction;Returned demonstration;Tactile cues required   ? ?  ?  ? ?  ? ? ? OT Short Term Goals - 11/30/21 0905   ? ?  ?  OT SHORT TERM GOAL #1  ? Title Pt and caregiver will be independent with HEP for RUE   ? Baseline Eval: No current program. 10th visit: Caregivers  report being comfortable with exercises at home.   ? Time 6   ? Period Weeks   ? Status On-going   ? Target Date 12/09/21   ? ?  ?  ? ?  ? ? ? ? OT Long Term Goals - 11/30/21 0908   ? ?  ? OT LONG TERM GOAL #1  ? Title Pt. will engage engage the RUE as a gross assist/atabilizer during ADLs 100% of the time   ? Baseline Eval: pt. does not currently engage her RUE during ADLs. 10th visit: Pt. continues to need to work towards using her RUE as a gross stabilizer.   ? Time 12   ? Period Weeks   ? Status On-going    ? Target Date 01/20/22   ?  ? OT LONG TERM GOAL #2  ? Title Pt. will perform UE dressing using one armed dressing techniques   ? Baseline Eval: MaxA, 10th visit: ModA   ? Time 12   ? Period Weeks   ? Status On-going   ? Target Date 01/20/22   ?  ? OT LONG TERM GOAL #3  ? Title Pt. will require minA to stand and hike pants   ? Baseline Eval: MaxA, 10th visit: ModA   ? Time 12   ? Period Weeks   ? Status On-going   ? Target Date 01/20/22   ?  ? OT LONG TERM GOAL #4  ? Title Pt. will perform LE dressing with minA   ? Baseline Eval: MaxA 10th visit: Pt. requires MaxA for donning her brace, shoes, and socks. ModA with pants.   ? Time 12   ? Period Weeks   ? Status On-going   ? Target Date 01/21/22   ?  ? OT LONG TERM GOAL #5  ? Title Pt. will improve FOTO score by 2 pt. to reflect funcational change   ? Baseline Eval: FOTO score: 12, 10th visit: FOTO score 27   ? Time 12   ? Period Weeks   ? Status On-going   ? Target Date 01/20/22   ?  ? OT LONG TERM GOAL #6  ? Title Patient will transfer to bedside commode or toilet with supervision.   ? ?  ?  ? ?  ? ? ? ? ? ? ? ? Plan - 12/28/21 1414   ? ? Clinical Impression Statement Pt.'s daughter reports that the pt.'s hand splint was left in the hot car, and became misshapen. Pt. Could benefit from an [employer]Med SoftPro resting hand splint for the right hand.  Pt./caregiver education was provided about the proper shoulder harness brace application secondary to right shoulder subluxation. Pt. Is improving with transfers. Pt. continues to require cues, and assist to advance her RLE, with increased assist required when turning to back up. Pt. Continues to work towards improving functional transfers for toileting. Plan to measure the pt. for a SoftPro resting hand splint. ? ?  ? OT Occupational Profile and History Detailed Assessment- Review of Records and additional review of physical, cognitive, psychosocial history related to current functional performance   ? Occupational  performance deficits (Please refer to evaluation for details): Leisure;IADL's;ADL's   ? Body Structure / Function / Physical Skills ADL;IADL;FMC;ROM;UE functional use;Strength;Decreased knowledge of use of DME;Coordination   ? Rehab Potential Fair   ?  Clinical Decision Making Several treatment options, min-mod task modification necessary   ? Comorbidities Affecting Occupational Performance: May have comorbidities impacting occupational performance   ? Modification or Assistance to Complete Evaluation  Min-Moderate modification of tasks or assist with assess necessary to complete eval   ? OT Frequency 2x / week   ? OT Duration 12 weeks   ? OT Treatment/Interventions Self-care/ADL training;Neuromuscular education;Electrical Stimulation;Patient/family education;Therapeutic activities;Passive range of motion;Therapist, nutritional;Therapeutic exercise;DME and/or AE instruction;Moist Heat   ? Consulted and Agree with Plan of Care Patient   ? ?  ?  ? ?  ? ? ?Patient will benefit from skilled therapeutic intervention in order to improve the following deficits and impairments:   ?Body Structure / Function / Physical Skills: ADL, IADL, FMC, ROM, UE functional use, Strength, Decreased knowledge of use of DME, Coordination ?  ?  ? ? ?Visit Diagnosis: ?Muscle weakness (generalized) ? ?Other lack of coordination ? ? ? ?Problem List ?Patient Active Problem List  ? Diagnosis Date Noted  ? Blood clotting disorder ([place]) 12/27/2021  ? Elevated AST (SGOT) 10/22/2021  ? 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 Cerebral embolism with cerebral infarction 09/23/2021  ? Pyelonephritis 09/19/2021  ? Pain due to onychomycosis of toenails of both feet 11/19/2020  ? Normocytic anemia 05/31/2020  ? Acute  ischemic left MCA stroke ([place]) 04/30/2020  ? Right hemiplegia ([place]) 04/30/2020  ? Cerebrovascular accident [facility]) 04/21/2020  ? Atrial fibrillation with RVR ([place]) 04/21/2020  ? Essential hypertension 04/21/2020

## 2021-12-28 NOTE — Therapy (Signed)
?Lindsey [name] ?[place], [place], [address] ?Phone: [phone]   Fax:  [phone] ? ?Speech Language Pathology Treatment ? ?Patient Details  ?Name: Lindsey [name] ?MRN: [id number] ?Date of Birth: March 11, 1956 ?Referring Provider (SLP): Dr. Ranell Patrick ? ? ?Encounter Date: 12/28/2021 ? ? End of Session - 12/28/21 1141   ? ? Visit Number 13   ? Number of Visits 25   ? Date for SLP Re-Evaluation 01/26/22   ? Authorization Type Medicare/ medicaid secondary   ? SLP Start Time 0900   ? SLP Stop Time  1000   ? SLP Time Calculation (min) 60 min   ? Activity Tolerance Patient tolerated treatment well   ? ?  ?  ? ?  ? ? ?Past Medical History:  ?Diagnosis Date  ? Aphasia   ? 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 CVA (cerebral vascular accident) [name])   ? Diverticulosis   ? History of ischemic left MCA stroke   ? Hypertension   ? Pain due to onychomycosis of toenails of both feet   ? 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 Uterine prolapse   ? ? ?No past surgical history on file. ? ?There were no vitals filed for this visit. ? ? Subjective Assessment - 12/28/21 1237   ? ? Subjective "I'm fine" on device   ? Patient is accompained by: Family member   Lindsey [name]  ? Currently in Pain? No/denies   ? Pain Score 0-No pain   ? ?  ?  ? ?  ? ? ? ? ? ? ? ? ADULT SLP TREATMENT - 12/28/21 1145   ? ?  ? General Information  ? Behavior/Cognition Alert;Cooperative;Pleasant mood   ? HPI Patient is a 66 y.o. female with prior history of CVA in 2021 with residual aphasia (using Lingraphica AAC device to assist with communication as of 08/2021) referred by Dr. Ranell Patrick after recent hospital admission when she was found to have new L AICA infarct complicated by pyelonephritis. Ad[facility] to [name] and then CIR 12/18-12/30/22. 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   ?  ? Cognitive-Linquistic Treatment  ? Treatment focused on Aphasia;Apraxia   ? Skilled Treatment Lindsey [name] Lindsey [name] reports pt has difficulty at home navigating device to request wants (watching tv). Modified home page layout for easier navigation of folders and icons. Added new icons for pt to discuss and request personally relevant activities (playing cards).  SLP demonstrated pacing strategies (pacing board, tapping on table) to help produce phrase "How are you Lindsey [name]." Pt educated on selecting icons on device when having difficulty saying it. Pt participated in recording her voice for personalization of icon to greet therapist (OT). With mod cues, pt navigated device and selected icons to answer questions about wants/needs from F:3-6.   ?  ? Assessment / Recommendations / Plan  ? Plan Continue with current plan of care   ? ?  ?  ? ?  ? ? ? SLP Education - 12/28/21 1210   ? ? Education Details Using AAC to make requests, Apraxia strategies   ? Person(s) Educated Patient   Lindsey [name] present for half of session (30 mins)  ? Methods Explanation   ? Comprehension Verbalized understanding   ? ?  ?  ? ?  ? ? ?  SLP Short Term Goals - 12/13/21 0924   ? ?  ? SLP SHORT TERM GOAL #1  ? Title Pt will answer personally relevant Y/N questions in context using multimodal communication or AAC if necessary, 75% accuracy.   ? Time 10   ? Period --   sessions  ? Status Achieved   ?  ? SLP SHORT TERM GOAL #2  ? Title Given verbal or visual prompt, pt will correctly ID written word/object/icon in field of 4-6 to communicate wants/needs with 75% accuracy given occasional mod A over 2 sessions   ? Baseline F:3 12/09/21   ? Time 10   ? Period --   visits  ? Status Not Met   ?  ? SLP SHORT TERM GOAL #3  ? Title Pt will verbally approximate 3 significant words for 5/10 trials given occasional fading to rare mod A  over 2 sessions   ? Time 10   ? Period --   sessions  ? Status Achieved   ? ?  ?  ? ?  ? ? ? SLP Long Term Goals - 12/13/21 0930   ? ?  ? SLP LONG TERM GOAL #1  ? Title Pt will use multimodal communication system with mod assist to express wants/needs given >2 choices over 2 sessions for 4/5 opportunities   ? Time 12   ? Period Weeks   ? Status On-going   ?  ? SLP LONG TERM GOAL #2  ? Title Caregiver will demonstrate appropriate use of supported conversation and use of multimodal supports to aid pt's comprehension of personally relevant topics, over 4 sessions.   ? Time 12   ? Period Weeks   ? Status On-going   ?  ? SLP LONG TERM GOAL #3  ? Title Caregiver will appropriately cue patient when apraxic/aphasic errors occurs for 4/5 opportunities over 2 sessions   ? Time 12   ? Period Weeks   ? Status On-going   ? ?  ?  ? ?  ? ? ? Plan - 12/28/21 1212   ? ? Clinical Impression Statement Lindsey [name] presents with severe aphasia and severe verbal apraxia, chronic deficits exacerbated by new CVA in December 2022. Pt continues to appear highly motivated during personalization of device. Pt gradually increasing navigation of device with assistance. Pt would benefit from continued training to increase independence in locating icons to communicate wants/needs and functional information. I continue to recommend skilled ST with focus on functional communication to improve ability to communicate wants/needs, reduce frustration/social isolation and improve quality of life.   ? Speech Therapy Frequency 2x / week   ? Duration 12 weeks   ? Treatment/Interventions Compensatory strategies;Patient/family education;Functional tasks;Cueing hierarchy;Environmental controls;Cognitive reorganization;Multimodal communcation approach;Language facilitation;Compensatory techniques;Internal/external aids;SLP instruction and feedback;Other (comment)   ? Potential to [place]   ? Potential Considerations Severity of impairments;Previous level  of function   ? SLP Home Exercise Plan bring Lingraphica to next session   ? Consulted and Agree with Plan of Care Patient;Family member/caregiver   ? Family Member Consulted daughter   ? ?  ?  ? ?  ? ? ?Patient will benefit from skilled therapeutic intervention in order to improve the following deficits and impairments:   ?Aphasia ? ?Verbal apraxia ? ? ? ?Problem List ?Patient Active Problem List  ? Diagnosis Date Noted  ? Blood clotting disorder ([place]) 12/27/2021  ? Elevated AST (SGOT) 10/22/2021  ? 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 Cerebral embolism with cerebral infarction 09/23/2021  ? Pyelonephritis 09/19/2021  ? Pain due to onychomycosis of toenails of both feet 11/19/2020  ? Normocytic anemia 05/31/2020  ? Acute ischemic left MCA stroke ([place]) 04/30/2020  ? Right hemiplegia ([place]) 04/30/2020  ? Cerebrovascular accident [facility]) 04/21/2020  ? Atrial fibrillation with RVR ([place]) 04/21/2020  ? Essential hypertension 04/21/2020  ? Alcohol abuse 04/21/2020  ? ? ? ?[place] [facility], [place] ?12/28/2021, 12:38 PM ? ?[address] ?[name] ?[place], [place], [address] ?Phone: [phone]   Fax:  [phone] ? ? ?Name: Deina Lipsey ?MRN: [id number] ?Date of Birth: 05-18-1956 ? ?

## 2021-12-30 ENCOUNTER — Ambulatory Visit: Payer: Medicare Other

## 2021-12-30 ENCOUNTER — Ambulatory Visit: Payer: Medicare Other | Admitting: Speech Pathology

## 2021-12-30 DIAGNOSIS — M6281 Muscle weakness (generalized): Secondary | ICD-10-CM

## 2021-12-30 DIAGNOSIS — I63512 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 middle cerebral artery: Secondary | ICD-10-CM

## 2021-12-30 DIAGNOSIS — I63542 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: Secondary | ICD-10-CM

## 2021-12-30 DIAGNOSIS — R278 Other lack of coordination: Secondary | ICD-10-CM

## 2021-12-30 DIAGNOSIS — R4701 Aphasia: Secondary | ICD-10-CM

## 2021-12-30 DIAGNOSIS — R262 Difficulty in walking, not elsewhere classified: Secondary | ICD-10-CM

## 2021-12-30 DIAGNOSIS — R2681 Unsteadiness on feet: Secondary | ICD-10-CM

## 2021-12-30 DIAGNOSIS — R269 Unspecified abnormalities of gait and mobility: Secondary | ICD-10-CM

## 2021-12-30 DIAGNOSIS — R482 Apraxia: Secondary | ICD-10-CM

## 2021-12-30 NOTE — Therapy (Signed)
[place] ?[facility] ?[place], [place], [address] ?Phone: [phone]   Fax:  [phone] ? ?Physical Therapy Treatment ? ?Patient Details  ?Name: Lindsey [name] ?MRN: [id number] ?Date of Birth: March 19, 1956 ?Referring Provider (PT): Raulkar, Clide Deutscher, MD ? ? ?Encounter Date: 12/30/2021 ? ? PT End of Session - 12/30/21 1145   ? ? Visit Number 9   ? Number of Visits 24   ? Date for PT Re-Evaluation 02/08/22   ? Authorization Time Period Cert through [id number]   ? Progress Note Due on Visit 10   ? PT Start Time 1145   ? PT Stop Time 1228   ? PT Time Calculation (min) 43 min   ? Equipment Utilized During Treatment Gait belt   ? Activity Tolerance Patient tolerated treatment well   ? Behavior During Therapy [facility] for tasks assessed/performed   ? ?  ?  ? ?  ? ? ?Past Medical History:  ?Diagnosis Date  ? Aphasia   ? 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 CVA (cerebral vascular accident) [facility])   ? Diverticulosis   ? History of ischemic left MCA stroke   ? Hypertension   ? Pain due to onychomycosis of toenails of both feet   ? 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 Uterine prolapse   ? ? ?History reviewed. No pertinent surgical history. ? ?There were no vitals filed for this visit. ? ? Subjective Assessment - 12/30/21 1144   ? ? Subjective Patient reports doing okay - denies any falls and states no pain.   ? Patient is accompained by: Family member   [name]  ? Pertinent History Pt was doing very well and was walking short distances wqith a cane prior to second stroke in december. At this point she is limited to wheelchair as her main means of mobility. Pt caregiver reports all detailed information. Pt has hesitation with the left lower extremity and significant weakness on the right lower extremity. Pt will take 2-3 steps when transfering. Pt caregiver assists with all transfers.Pt has shower with ledge to steps over  ledge when entering and utilizes bedside commode. Pt has 2 level home with chair lift for upstairs access. Pt would like to improve ransfers, improve neglect on her R side and improve her overall strength and mobility.Pt. is a 66 y.o. 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 Pt. has Right sided weakness, and receptive, and expressive aphasia. Pt. has had a previous CVA with right sided weakness in July 2021. PMHx includes: AFib, Renal Artery thrombosis, situational depression, HTN, Pyelnephritis, Seizure activity, COnstipation, AKI. Vitamin D deficiency, and urinary incontinence.   ? Limitations Standing;Walking;House hold activities   ? How long can you sit comfortably? n/a   ? How long can you stand comfortably? 2 min   ? How long can you walk comfortably? 3-4 steps   ? Patient Stated Goals Improve balance and functional movements   ? Currently in Pain? No/denies   ? ?  ?  ? ?  ? ? ? ?Interventions:  ? ?[place] [facility] ?[place], [place], [address] ?Phone: [phone]   Fax:  [phone] ? ?Physical Therapy Treatment ? ?Patient Details  ?Name: Lindsey [name] ?MRN: [id number] ?Date of Birth: 28-Nov-1955 ?Referring Provider (PT): Raulkar, Clide Deutscher, MD ? ? ?Encounter Date: 12/30/2021 ? ?  PT End of Session - 12/30/21 1145   ? ? Visit Number 9   ? Number of Visits 24   ? Date for PT Re-Evaluation 02/08/22   ? Authorization Time Period Cert through [id number]   ? Progress Note Due on Visit 10   ? PT Start Time 1145   ? PT Stop Time 1228   ? PT Time Calculation (min) 43 min   ? Equipment Utilized During Treatment Gait belt   ? Activity Tolerance Patient tolerated treatment well   ? Behavior During Therapy [facility] for tasks assessed/performed   ? ?  ?  ? ?  ? ? ?Past Medical History:  ?Diagnosis Date  ? Aphasia   ? 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  cerebellar artery (HCC)   ? CVA (cerebral vascular accident) [facility])   ? Diverticulosis   ? History of ischemic left MCA stroke   ? Hypertension   ? Pain due to onychomycosis of toenails of both feet   ? 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 Uterine prolapse   ? ? ?History reviewed. No pertinent surgical history. ? ?There were no vitals filed for this visit. ? ? Subjective Assessment - 12/30/21 1144   ? ? Subjective Patient reports doing okay - denies any falls and states no pain.   ? Patient is accompained by: Family member   [name]  ? Pertinent History Pt was doing very well and was walking short distances wqith a cane prior to second stroke in december. At this point she is limited to wheelchair as her main means of mobility. Pt caregiver reports all detailed information. Pt has hesitation with the left lower extremity and significant weakness on the right lower extremity. Pt will take 2-3 steps when transfering. Pt caregiver assists with all transfers.Pt has shower with ledge to steps over ledge when entering and utilizes bedside commode. Pt has 2 level home with chair lift for upstairs access. Pt would like to improve ransfers, improve neglect on her R side and improve her overall strength and mobility.Pt. is a 66 y.o. 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 Pt. has Right sided weakness, and receptive, and expressive aphasia. Pt. has had a previous CVA with right sided weakness in July 2021. PMHx includes: AFib, Renal Artery thrombosis, situational depression, HTN, Pyelnephritis, Seizure activity, COnstipation, AKI. Vitamin D deficiency, and urinary incontinence.   ? Limitations Standing;Walking;House hold activities   ? How long can you sit comfortably? n/a   ? How long can you stand comfortably? 2 min   ? How long can you walk comfortably? 3-4 steps   ? Patient Stated Goals Improve balance and functional movements   ? Currently in Pain?  No/denies   ? ?  ?  ? ?  ?Gait training: Patient ambulated around 35 feet and later 85 feet using hemiwalker on left, min assist for trunk support and max VC for step cadence. Patient demonstrated short step to gait with some difficulty advancing right LE.  ? ?Therex:  ?LAQ with 4# LLE and no weight RLE 2x15, with PT providing TC and VC throughout. ?  ?  ?Seated hip march alt LE 2x 10 reps (AAROM right LE) with pt wearing 4# weight on LLE; PT continues to provide frequent cuing. She fatigued quickly with both legs.  ?  ?  ?Adductor squeezes with therapy ball - 2 sets of 12 reps with 3 sec  holds B  ? ?  ?  ?RTB hamstring curls BLE x15 reps. VC for right LE with minimal ability to perform ? ?Education provided throughout session via VC/TC and demonstration to facilitate movement at target joints and correct muscle activation for all testing and exercises performed.  ?  ? ? ? ? ? ? ? ? ? ? ? ? ? ? ? ? ? ? ? ? ? ? ? ? ? ? ? ? PT Education - 12/30/21 1145   ? ? Education Details Exercise technique; gait ed   ? Person(s) Educated Patient   ? Methods Explanation;Demonstration;Tactile cues;Verbal cues   ? Comprehension Verbalized understanding;Returned demonstration;Verbal cues required;Tactile cues required;Need further instruction   ? ?  ?  ? ?  ? ? ? PT Short Term Goals - 11/16/21 1207   ? ?  ? PT SHORT TERM GOAL #1  ? Title Patient will be independent in home exercise program to improve strength/mobility for better functional independence with ADLs.   ? Baseline no formal HEp for LE/balance at this time   ? Time 4   ? Period Weeks   ? Status New   ? Target Date 12/14/21   ? ?  ?  ? ?  ? ? ? ? PT Long Term Goals - 11/16/21 1207   ? ?  ? PT LONG TERM GOAL #1  ? Title Patient will increase FOTO score to equal to or greater than   41  to demonstrate statistically significant improvement in mobility and quality of life.   ? Baseline 16 on 2/14   ? Time 12   ? Period Weeks   ? Status New   ? Target Date 02/08/22   ?  ? PT  LONG TERM GOAL #3  ? Title Patient  will complete five times sit to stand test in < 30 seconds with UE assistance indicating an increased LE strength and improved balance.   ? Baseline 58.47 s with UE assist and w

## 2021-12-30 NOTE — Therapy (Signed)
[place] ?[facility] ?[place], [place], [address] ?Phone: [phone]   Fax:  [phone] ? ?Speech Language Pathology Treatment ? ?Patient Details  ?Name: Lindsey [name] ?MRN: [id number] ?Date of Birth: 1956-04-26 ?Referring Provider (SLP): Dr. Ranell Patrick ? ? ?Encounter Date: 12/30/2021 ? ? End of Session - 12/30/21 1342   ? ? Visit Number 14   ? Number of Visits 25   ? Date for SLP Re-Evaluation 01/26/22   ? Authorization Type Medicare/ medicaid secondary   ? SLP Start Time 1233   ? SLP Stop Time  1330   ? SLP Time Calculation (min) 57 min   ? Activity Tolerance Patient tolerated treatment well   ? ?  ?  ? ?  ? ? ?Past Medical History:  ?Diagnosis Date  ? Aphasia   ? 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 CVA (cerebral vascular accident) [facility])   ? Diverticulosis   ? History of ischemic left MCA stroke   ? Hypertension   ? Pain due to onychomycosis of toenails of both feet   ? 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 Uterine prolapse   ? ? ?No past surgical history on file. ? ?There were no vitals filed for this visit. ? ? Subjective Assessment - 12/30/21 1340   ? ? Subjective Pt reports "I'm fine" and "I'm tired" on device   ? Patient is accompained by: Family member   Daughter Lindsey [name] attended session  ? Currently in Pain? No/denies   ? Pain Score 0-No pain   ? ?  ?  ? ?  ? ? ? ? ? ? ? ? ADULT SLP TREATMENT - 12/30/21 1343   ? ?  ? General Information  ? Behavior/Cognition Alert;Cooperative;Pleasant mood   ? HPI Patient is a 66 y.o. female with prior history of CVA in 2021 with residual aphasia (using Lingraphica AAC device to assist with communication as of 08/2021) referred by Dr. Ranell Patrick after recent hospital admission when she was found to have new L AICA infarct complicated by pyelonephritis. Admitted to [facility] and then CIR 12/18-12/30/22. MRI 09/22/21 showed acute moderate size L AICA cerebellar infarct,  underlying extensive chronic left MCA encephalomalacia related to 2021 infarct, left brainstem Wallerian degeneration and chronic left PICA cerebellar infarcts.   ?  ? Treatment Provided  ? Treatment provided Cognitive-Linquistic   ?  ? Cognitive-Linquistic Treatment  ? Treatment focused on Aphasia;Apraxia   ? Skilled Treatment Patient selected icons to inquire about therapist and answer questions about feelings and pain with min-mod cues. Educated daughter Lindsey [name] on using real images vs abstract icons to support pt using device for requests/wants/needs. Showed pt's daughter how to take and add pictures to edit icons on device.  Pt participated in taking images to personalize and add icons to device (arm brace, tissue). Pt navigated device to answer simple personal questions with mod A (using back button, selecting categories). Educated patient on using device to speak and not just for speech practice. Added new icons to leisure folder to make comments during activity of interest (card game). During card game, pt selected icons on device to ask questions and make comments (my turn, your turn, will you shuffle?) with mod cues.   ?  ? Assessment / Recommendations / Plan  ? Plan Continue with current plan of care   ?  ? Progression Toward Goals  ? Progression toward goals Progressing toward goals   ? ?  ?  ? ?  ? ? ?  SLP Education - 12/30/21 1411   ? ? Education Details Opportunities to use AAC device, Using AAC to communicate   ? Person(s) Educated Patient;Child(ren)   Daughter Lindsey [name]  ? Methods Explanation   ? Comprehension Verbalized understanding   ? ?  ?  ? ?  ? ? ? SLP Short Term Goals - 12/13/21 0924   ? ?  ? SLP SHORT TERM GOAL #1  ? Title Pt will answer personally relevant Y/N questions in context using multimodal communication or AAC if necessary, 75% accuracy.   ? Time 10   ? Period --   sessions  ? Status Achieved   ?  ? SLP SHORT TERM GOAL #2  ? Title Given verbal or visual prompt, pt will correctly ID  written word/object/icon in field of 4-6 to communicate wants/needs with 75% accuracy given occasional mod A over 2 sessions   ? Baseline F:3 12/09/21   ? Time 10   ? Period --   visits  ? Status Not Met   ?  ? SLP SHORT TERM GOAL #3  ? Title Pt will verbally approximate 3 significant words for 5/10 trials given occasional fading to rare mod A over 2 sessions   ? Time 10   ? Period --   sessions  ? Status Achieved   ? ?  ?  ? ?  ? ? ? SLP Long Term Goals - 12/13/21 0930   ? ?  ? SLP LONG TERM GOAL #1  ? Title Pt will use multimodal communication system with mod assist to express wants/needs given >2 choices over 2 sessions for 4/5 opportunities   ? Time 12   ? Period Weeks   ? Status On-going   ?  ? SLP LONG TERM GOAL #2  ? Title Caregiver will demonstrate appropriate use of supported conversation and use of multimodal supports to aid pt's comprehension of personally relevant topics, over 4 sessions.   ? Time 12   ? Period Weeks   ? Status On-going   ?  ? SLP LONG TERM GOAL #3  ? Title Caregiver will appropriately cue patient when apraxic/aphasic errors occurs for 4/5 opportunities over 2 sessions   ? Time 12   ? Period Weeks   ? Status On-going   ? ?  ?  ? ?  ? ? ? Plan - 12/30/21 1357   ? ? Clinical Impression Statement [name] presents with severe aphasia and severe verbal apraxia, chronic deficits exacerbated by new CVA in December 2022. Pt highly motivated and receptive to using device in activities of interest. Continue to educate pt on opportunities to use device (making requests, expressing wants, activities of leisure). Pt navigating device more, but requires assistance.  Plan to continue providing training to increase independence in locating icons to communicate. Caregiver receptive to implementing strategies to help increase pt's independent use of device. I continue to recommend skilled ST with focus on functional communication to improve ability to communicate wants/needs, reduce frustration/social  isolation and improve quality of life.   ? Speech Therapy Frequency 2x / week   ? Duration 12 weeks   ? Treatment/Interventions Compensatory strategies;Patient/family education;Functional tasks;Cueing hierarchy;Environmental controls;Cognitive reorganization;Multimodal communcation approach;Language facilitation;Compensatory techniques;Internal/external aids;SLP instruction and feedback;Other (comment)   ? Potential to [place]   ? Potential Considerations Severity of impairments;Previous level of function   ? SLP Home Exercise Plan bring Lingraphica to next session   ? Consulted and Agree with Plan of Care Patient;Family member/caregiver   ?  Family Member Consulted daughter   ? ?  ?  ? ?  ? ? ?Patient will benefit from skilled therapeutic intervention in order to improve the following deficits and impairments:   ?Aphasia ? ?Verbal apraxia ? ? ? ?Problem List ?Patient Active Problem List  ? Diagnosis Date Noted  ? Blood clotting disorder ([place]) 12/27/2021  ? Elevated AST (SGOT) 10/22/2021  ? 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 Cerebral embolism with cerebral infarction 09/23/2021  ? Pyelonephritis 09/19/2021  ? Pain due to onychomycosis of toenails of both feet 11/19/2020  ? Normocytic anemia 05/31/2020  ? Acute ischemic left MCA stroke ([place]) 04/30/2020  ? Right hemiplegia ([place]) 04/30/2020  ? Cerebrovascular accident [facility]) 04/21/2020  ? Atrial fibrillation with RVR ([place]) 04/21/2020  ? Essential hypertension 04/21/2020  ? Alcohol abuse 04/21/2020  ?Caryl Comes [facility]-S[facility] SLP Student   ? ?[place] [facility], [place] ?12/30/2021, 2:12 PM ? ?[place] ?[facility] ?[place], [place], [address] ?Phone: [phone]    Fax:  [phone] ? ? ?Name: Clotilda Hafer ?MRN: [id number] ?Date of Birth: 01/06/1956 ? ?

## 2021-12-31 NOTE — Therapy (Signed)
[place] ?[facility] ?[place], [place], [address] ?Phone: [phone]   Fax:  [phone] ? ?Occupational Therapy Treatment ? ?Patient Details  ?Name: Lindsey [name] ?MRN: [id number] ?Date of Birth: [birth year] ?Referring Provider (OT): Dr. Ranell Patrick ? ? ?Encounter Date: 12/30/2021 ? ? OT End of Session - 12/31/21 0828   ? ? Visit Number 18   ? Number of Visits 24   ? Date for OT Re-Evaluation 01/20/22   ? Authorization Type Progress report period starting 12/02/2021   ? OT Start Time 1100   ? OT Stop Time 1145   ? OT Time Calculation (min) 45 min   ? Equipment Utilized During Treatment transport chair   ? Activity Tolerance Patient tolerated treatment well   ? Behavior During Therapy [facility] for tasks assessed/performed   ? ?  ?  ? ?  ? ? ?Past Medical History:  ?Diagnosis Date  ? Aphasia   ? 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 CVA (cerebral vascular accident) [facility])   ? Diverticulosis   ? History of ischemic left MCA stroke   ? Hypertension   ? Pain due to onychomycosis of toenails of both feet   ? 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 Uterine prolapse   ? ? ?No past surgical history on file. ? ?There were no vitals filed for this visit. ? ? Subjective Assessment - 12/30/21 0826   ? ? Subjective  Daughter reports she's not sure if she had pt's sling on correctly today and asked OT to check.   ? Patient is accompanied by: Family member   ? Pertinent History Pt. is a 66 y.o. 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 Pt. has Right sided weakness, and receptive, and expressive aphasia. Pt. has had a previous CVA with right sided weakness in July 2021. PMHx includes: AFib, Renal Artery thrombosis, situational depression, HTN, Pyelnephritis, Seizure activity, COnstipation, AKI. Vitamin D deficiency, and urinary incontinence/   ? Limitations  Right side weakness, reptive, and expressive ahasia   ? Patient Stated Goals To be able to dress herself.   ? Currently in Pain? No/denies   ? Pain Score 0-No pain   ? Multiple Pain Sites No   ? ?  ?  ? ?  ?Occupational Therapy Treatment: ?Orthotic Fitting: ?Adjusted straps slightly on pt's hemi-sling, but noting good fit and daughter had donned correctly.  Pt reports no issues and R arm to be comfortable.  OT measured R hand for soft pro resting functional hand splint, as pt's custom splint was left accidentally in a hot car and became misshapen.  Recommending medium size for this brand and relayed this recommendation to daughter.  ? ?Therapeutic Exercise: ?Performed PROM to R shoulder, elbow, forearm, wrist, hand, all planes, working to reduce tone and decrease stiffness in prep for functional transfers.  ? ?Self Care: ?Practiced sit to stand from chair with arm rests and mat table, multiple trials.  Initial min A with tactile cues for forward lean, progressed to min guard from chair and mat table.  OT providing set up of R foot placement in prep for transfer.  Practiced step pivot transfers mat<>chair to simulate BSC transfers in the home; multiple trials completed all with min guard.  Performed functional mobility with hemi-walker into bathroom in prep for toilet transfer.  Pt performing short distance functional mobility (  inside bathroom) with mod A, specifically requiring stabilizing with directional changes d/t posterior and lateral swaying.  Pt performing toilet transfer with mod A; difficulty d/t grab bar on hemiparetic side, but pt crossed over with L hand to utilize grab bar and OT instructed pt to turn slightly at an angle toward bar for better use of grab bar during transfer.  Pt stood at sink with min A to complete hand hygiene, vc for positioning self with hips up against sink for better stability.   ? ?Response to Treatment: ?Pt consistent with min guard today for sit to stand and stand pivot  transfers, but each transfer requires repeated attempts and vc for forward weight shifting.  Pt appears comfortable in her new hemi sling and daughter demonstrated good carry over with donning technique.  Measured for soft pro functional resting hand splint today; daughter notified of recommended sizing.  Pt will continue to benefit from OT for reducing stiffness in R shoulder joint in order to ease caregiver assist with UB ADLs, ADL training, tone reduction strategies, and functional transfer training and increasing dynamic standing balance in order to maximize indep with daily tasks and reduce caregiver burden in the home.  ? ? ? OT Education - 12/30/21 0827   ? ? Education Details toilet transfer technique   ? Person(s) Educated Patient   ? Methods Explanation;Verbal cues;Tactile cues   ? Comprehension Verbal cues required;Need further instruction;Returned demonstration;Tactile cues required   ? ?  ?  ? ?  ? ? ? OT Short Term Goals - 11/30/21 0905   ? ?  ? OT SHORT TERM GOAL #1  ? Title Pt and caregiver will be independent with HEP for RUE   ? Baseline Eval: No current program. 10th visit: Caregivers  report being comfortable with exercises at home.   ? Time 6   ? Period Weeks   ? Status On-going   ? Target Date 12/09/21   ? ?  ?  ? ?  ? ? ? ? OT Long Term Goals - 11/30/21 0908   ? ?  ? OT LONG TERM GOAL #1  ? Title Pt. will engage engage the RUE as a gross assist/atabilizer during ADLs 100% of the time   ? Baseline Eval: pt. does not currently engage her RUE during ADLs. 10th visit: Pt. continues to need to work towards using her RUE as a gross stabilizer.   ? Time 12   ? Period Weeks   ? Status On-going   ? Target Date 01/20/22   ?  ? OT LONG TERM GOAL #2  ? Title Pt. will perform UE dressing using one armed dressing techniques   ? Baseline Eval: MaxA, 10th visit: ModA   ? Time 12   ? Period Weeks   ? Status On-going   ? Target Date 01/20/22   ?  ? OT LONG TERM GOAL #3  ? Title Pt. will require minA to stand  and hike pants   ? Baseline Eval: MaxA, 10th visit: ModA   ? Time 12   ? Period Weeks   ? Status On-going   ? Target Date 01/20/22   ?  ? OT LONG TERM GOAL #4  ? Title Pt. will perform LE dressing with minA   ? Baseline Eval: MaxA 10th visit: Pt. requires MaxA for donning her brace, shoes, and socks. ModA with pants.   ? Time 12   ? Period Weeks   ? Status On-going   ? Target  Date 01/21/22   ?  ? OT LONG TERM GOAL #5  ? Title Pt. will improve FOTO score by 2 pt. to reflect funcational change   ? Baseline Eval: FOTO score: 12, 10th visit: FOTO score 27   ? Time 12   ? Period Weeks   ? Status On-going   ? Target Date 01/20/22   ?  ? OT LONG TERM GOAL #6  ? Title Patient will transfer to bedside commode or toilet with supervision.   ? ?  ?  ? ?  ? ? ? ? Plan - 12/30/21 0845   ? ? Clinical Impression Statement Pt consistent with min guard today for sit to stand and stand pivot transfers, but each transfer requires repeated attempts and vc for forward weight shifting.  Pt appears comfortable in her new hemi sling and daughter demonstrated good carry over with donning technique.  Measured for soft pro functional resting hand splint today; daughter notified of recommended sizing.  Pt will continue to benefit from OT for reducing stiffness in R shoulder joint in order to ease caregiver assist with UB ADLs, ADL training, tone reduction strategies, and functional transfer training and increasing dynamic standing balance in order to maximize indep with daily tasks and reduce caregiver burden in the home.   ? OT Occupational Profile and History Detailed Assessment- Review of Records and additional review of physical, cognitive, psychosocial history related to current functional performance   ? Occupational performance deficits (Please refer to evaluation for details): Leisure;IADL's;ADL's   ? Body Structure / Function / Physical Skills ADL;IADL;FMC;ROM;UE functional use;Strength;Decreased knowledge of use of DME;Coordination    ? Rehab Potential Fair   ? Clinical Decision Making Several treatment options, min-mod task modification necessary   ? Comorbidities Affecting Occupational Performance: May have comorbidities impacting occupa

## 2022-01-04 ENCOUNTER — Ambulatory Visit: Payer: Medicare Other | Admitting: Speech Pathology

## 2022-01-04 ENCOUNTER — Ambulatory Visit: Payer: Medicare Other | Attending: Physical Medicine and Rehabilitation | Admitting: Occupational Therapy

## 2022-01-04 ENCOUNTER — Encounter: Payer: Self-pay | Admitting: Occupational Therapy

## 2022-01-04 DIAGNOSIS — R2681 Unsteadiness on feet: Secondary | ICD-10-CM | POA: Diagnosis present

## 2022-01-04 DIAGNOSIS — R2689 Other abnormalities of gait and mobility: Secondary | ICD-10-CM | POA: Diagnosis present

## 2022-01-04 DIAGNOSIS — M6281 Muscle weakness (generalized): Secondary | ICD-10-CM | POA: Insufficient documentation

## 2022-01-04 DIAGNOSIS — R262 Difficulty in walking, not elsewhere classified: Secondary | ICD-10-CM | POA: Insufficient documentation

## 2022-01-04 DIAGNOSIS — R482 Apraxia: Secondary | ICD-10-CM

## 2022-01-04 DIAGNOSIS — I63542 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: Secondary | ICD-10-CM | POA: Diagnosis present

## 2022-01-04 DIAGNOSIS — I63512 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 middle cerebral artery: Secondary | ICD-10-CM | POA: Insufficient documentation

## 2022-01-04 DIAGNOSIS — R278 Other lack of coordination: Secondary | ICD-10-CM | POA: Diagnosis present

## 2022-01-04 DIAGNOSIS — R4701 Aphasia: Secondary | ICD-10-CM

## 2022-01-04 DIAGNOSIS — R269 Unspecified abnormalities of gait and mobility: Secondary | ICD-10-CM | POA: Insufficient documentation

## 2022-01-04 NOTE — Therapy (Signed)
[place] ?[facility] ?[place], [place], [address] ?Phone: [phone]   Fax:  [phone] ? ?Speech Language Pathology Treatment ? ?Patient Details  ?Name: Lindsey [name] ?MRN: [id number] ?Date of Birth: May 07, 1956 ?Referring Provider (SLP): Dr. Ranell Patrick ? ? ?Encounter Date: 01/04/2022 ? ? End of Session - 01/04/22 1528   ? ? Visit Number 15   ? Number of Visits 25   ? Date for SLP Re-Evaluation 01/26/22   ? Authorization Type Medicare/ medicaid secondary   ? SLP Start Time 1104   ? SLP Stop Time  1158   ? SLP Time Calculation (min) 54 min   ? Activity Tolerance Patient tolerated treatment well   ? ?  ?  ? ?  ? ? ?Past Medical History:  ?Diagnosis Date  ? Aphasia   ? 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 CVA (cerebral vascular accident) [facility])   ? Diverticulosis   ? History of ischemic left MCA stroke   ? Hypertension   ? Pain due to onychomycosis of toenails of both feet   ? 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)   ? Uterine prolapse   ? ? ?No past surgical history on file. ? ?There were no vitals filed for this visit. ? ? Subjective Assessment - 01/04/22 1501   ? ? Subjective Responded "not bad" when graduate clinican asked how are you   ? Patient is accompained by: Family member   Yolanda Bonine [name] present for session  ? Currently in Pain? No/denies   ? Pain Score 0-No pain   ? Multiple Pain Sites No   ? ?  ?  ? ?  ? ? ? ? ? ? ? ? ADULT SLP TREATMENT - 01/04/22 1503   ? ?  ? General Information  ? Behavior/Cognition Alert;Cooperative;Pleasant mood   ? HPI Patient is a 66 y.o. female with prior history of CVA in 2021 with residual aphasia (using Lingraphica AAC device to assist with communication as of 08/2021) referred by Dr. Ranell Patrick after recent hospital admission when she was found to have new L AICA infarct complicated by pyelonephritis. Admitted to [facility] and then CIR 12/18-12/30/22. MRI 09/22/21 showed acute  moderate size L AICA cerebellar infarct, underlying extensive chronic left MCA encephalomalacia related to 2021 infarct, left brainstem Wallerian degeneration and chronic left PICA cerebellar infarcts.   ?  ? Treatment Provided  ? Treatment provided Cognitive-Linquistic   ?  ? Cognitive-Linquistic Treatment  ? Treatment focused on Aphasia;Apraxia   ? Skilled Treatment With min question cues, pt asked clinician and graduate clinician "How are you doing". When asked about pain, pt responded "no pain." Pt participated in adding new icons to leisure folder of pt's interest (tv shows, sitting outside). Pt navigated device with min-mod assist to answer simple questions; making requests for wants/needs from F:3-F:6. With min-mod A, pt navigated device to recall which therapist she saw for OT this morning, and what she had for breakfast. When referring to her progress with speech and her arm strength, pt stated "it's all getting better".   ?  ? Assessment / Recommendations / Plan  ? Plan Continue with current plan of care   ?  ? Progression Toward Goals  ? Progression toward goals Progressing toward goals   ? ?  ?  ? ?  ? ? ? SLP Education - 01/05/22 0946   ? ? Education Details frequency of practice, repetition at home  help her make improvements   ? Person(s) Educated Patient   grandson  ? Methods Explanation   ? Comprehension Verbalized understanding   ? ?  ?  ? ?  ? ? ? SLP Short Term Goals - 12/13/21 0924   ? ?  ? SLP SHORT TERM GOAL #1  ? Title Pt will answer personally relevant Y/N questions in context using multimodal communication or AAC if necessary, 75% accuracy.   ? Time 10   ? Period --   sessions  ? Status Achieved   ?  ? SLP SHORT TERM GOAL #2  ? Title Given verbal or visual prompt, pt will correctly ID written word/object/icon in field of 4-6 to communicate wants/needs with 75% accuracy given occasional mod A over 2 sessions   ? Baseline F:3 12/09/21   ? Time 10   ? Period --   visits  ? Status Not Met   ?  ?  SLP SHORT TERM GOAL #3  ? Title Pt will verbally approximate 3 significant words for 5/10 trials given occasional fading to rare mod A over 2 sessions   ? Time 10   ? Period --   sessions  ? Status Achieved   ? ?  ?  ? ?  ? ? ? SLP Long Term Goals - 12/13/21 0930   ? ?  ? SLP LONG TERM GOAL #1  ? Title Pt will use multimodal communication system with mod assist to express wants/needs given >2 choices over 2 sessions for 4/5 opportunities   ? Time 12   ? Period Weeks   ? Status On-going   ?  ? SLP LONG TERM GOAL #2  ? Title Caregiver will demonstrate appropriate use of supported conversation and use of multimodal supports to aid pt's comprehension of personally relevant topics, over 4 sessions.   ? Time 12   ? Period Weeks   ? Status On-going   ?  ? SLP LONG TERM GOAL #3  ? Title Caregiver will appropriately cue patient when apraxic/aphasic errors occurs for 4/5 opportunities over 2 sessions   ? Time 12   ? Period Weeks   ? Status On-going   ? ?  ?  ? ?  ? ? ? Plan - 01/04/22 1529   ? ? Clinical Impression Statement Lindsey [name] presents with severe aphasia and severe verbal apraxia, chronic deficits exacerbated by new CVA in December 2022. Noted increased production of spontaneous speech today. Pt gradually increasing independence with navigation of device; reduced need for cuing this session. Pt continues to benefit from gestural cues to navigate the device (folders, categories, back/home button). Plan to continue providing training to increase independent use/initiation with device to communicate. Recommend skilled ST with focus on functional communication to improve ability to communicate wants/needs, reduce frustration/social isolation and improve quality of life.   ? Speech Therapy Frequency 2x / week   ? Duration 12 weeks   ? Treatment/Interventions Compensatory strategies;Patient/family education;Functional tasks;Cueing hierarchy;Environmental controls;Cognitive reorganization;Multimodal communcation  approach;Language facilitation;Compensatory techniques;Internal/external aids;SLP instruction and feedback;Other (comment)   ? Potential to [place]   ? Potential Considerations Severity of impairments;Previous level of function   ? SLP Home Exercise Plan bring Lingraphica to next session   ? Consulted and Agree with Plan of Care Patient;Family member/caregiver   ? Family Member Consulted daughter   ? ?  ?  ? ?  ? ? ?Patient will benefit from skilled therapeutic intervention in order to improve the following deficits and impairments:   ?  Aphasia ? ?Verbal apraxia ? ? ? ?Problem List ?Patient Active Problem List  ? Diagnosis Date Noted  ? Blood clotting disorder ([place]) 12/27/2021  ? Elevated AST (SGOT) 10/22/2021  ? 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 Cerebral embolism with cerebral infarction 09/23/2021  ? Pyelonephritis 09/19/2021  ? Pain due to onychomycosis of toenails of both feet 11/19/2020  ? Normocytic anemia 05/31/2020  ? Acute ischemic left MCA stroke ([place]) 04/30/2020  ? Right hemiplegia ([place]) 04/30/2020  ? Cerebrovascular accident [facility]) 04/21/2020  ? Atrial fibrillation with RVR ([place]) 04/21/2020  ? Essential hypertension 04/21/2020  ? Alcohol abuse 04/21/2020  ?Caryl Comes Smith-Sneed SLP Student   ? ?Aliene Altes, CCC-SLP ?01/05/2022, 9:48 AM ? ?[place] ?[facility] ?[place], [place], [address] ?Phone: [phone]   Fax:  [phone] ? ? ?Name: Lindsey [name] ?MRN: [id number] ?Date of Birth: [birth year] ? ?

## 2022-01-04 NOTE — Therapy (Signed)
?[facility] ?[place], [place], [address] ?Phone: [phone]   Fax:  [phone] ? ?Occupational Therapy Treatment ? ?Patient Details  ?Name: Lindsey [name] ?MRN: [id number] ?Date of Birth: 13-Aug-1956 ?Referring Provider (OT): Dr. Ranell Patrick ? ? ?Encounter Date: 01/04/2022 ? ? OT End of Session - 01/04/22 1226   ? ? Visit Number 19   ? Number of Visits 24   ? Date for OT Re-Evaluation 01/20/22   ? Authorization Type Progress report period starting 12/02/2021   ? OT Start Time 1019   ? OT Stop Time 1100   ? OT Time Calculation (min) 41 min   ? Activity Tolerance Patient tolerated treatment well   ? Behavior During Therapy [facility] for tasks assessed/performed   ? ?  ?  ? ?  ? ? ?Past Medical History:  ?Diagnosis Date  ? Aphasia   ? 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 CVA (cerebral vascular accident) [facility])   ? Diverticulosis   ? History of ischemic left MCA stroke   ? Hypertension   ? Pain due to onychomycosis of toenails of both feet   ? 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 Uterine prolapse   ? ? ?History reviewed. No pertinent surgical history. ? ?There were no vitals filed for this visit. ? ? Subjective Assessment - 01/04/22 1225   ? ? Subjective  Reviewed the right shoulder harness brace application with her grandson, [name].   ? Patient is accompanied by: Family member   ? Pertinent History Pt. is a 66 y.o. 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 Pt. has Right sided weakness, and receptive, and expressive aphasia. Pt. has had a previous CVA with right sided weakness in July 2021. PMHx includes: AFib, Renal Artery thrombosis, situational depression, HTN, Pyelnephritis, Seizure activity, COnstipation, AKI. Vitamin D deficiency, and urinary incontinence/   ? Currently in Pain? No/denies   ? ?  ?  ? ?  ? ? ?OT TREATMENT   ?   ?Self-care:  ?  ?Pt. worked on transfers, and functional mobility using a hemiwalker  in small spaces to simulate bathroom spaces in preparation for toileting transfers.   ?  ?Therapeutic Exercise: ?  ?Pt. tolerated PROM through all joint ranges of the RUE, and hand. Pt. Performed self-ROM to the right MPs, PIPs, and DIPs. Pt. Worked on BUE strengthening, and reciprocal motion using the UBE while seated for 8 min. with no resistance. Constant monitoring was provided, support at the right elbow, and an ACE wrap was used to secure her right hand onto the handle. ?  ?Education was provided to the patient's grandson about the proper shoulder harness brace application secondary to right shoulder subluxation. Pt. Continues to improve with transfers. Pt. continues to require cues, and assist to advance her RLE, with increased assist required when turning to back up. Pt. Continues to work towards improving functional transfers for toileting. ?  ?  ?  ? ? ? ? ? ? ? ? ? ? ? ? ? ? ? ? ? ? ? ? OT Education - 01/04/22 1226   ? ? Education Details toilet transfer technique   ? Person(s) Educated Patient   ? Methods Explanation;Verbal cues;Tactile cues   ? Comprehension Verbal cues required;Need further instruction;Returned demonstration;Tactile cues required   ? ?  ?  ? ?  ? ? ?  OT Short Term Goals - 11/30/21 0905   ? ?  ? OT SHORT TERM GOAL #1  ? Title Pt and caregiver will be independent with HEP for RUE   ? Baseline Eval: No current program. 10th visit: Caregivers  report being comfortable with exercises at home.   ? Time 6   ? Period Weeks   ? Status On-going   ? Target Date 12/09/21   ? ?  ?  ? ?  ? ? ? ? OT Long Term Goals - 11/30/21 0908   ? ?  ? OT LONG TERM GOAL #1  ? Title Pt. will engage engage the RUE as a gross assist/atabilizer during ADLs 100% of the time   ? Baseline Eval: pt. does not currently engage her RUE during ADLs. 10th visit: Pt. continues to need to work towards using her RUE as a gross stabilizer.   ?  Time 12   ? Period Weeks   ? Status On-going   ? Target Date 01/20/22   ?  ? OT LONG TERM GOAL #2  ? Title Pt. will perform UE dressing using one armed dressing techniques   ? Baseline Eval: MaxA, 10th visit: ModA   ? Time 12   ? Period Weeks   ? Status On-going   ? Target Date 01/20/22   ?  ? OT LONG TERM GOAL #3  ? Title Pt. will require minA to stand and hike pants   ? Baseline Eval: MaxA, 10th visit: ModA   ? Time 12   ? Period Weeks   ? Status On-going   ? Target Date 01/20/22   ?  ? OT LONG TERM GOAL #4  ? Title Pt. will perform LE dressing with minA   ? Baseline Eval: MaxA 10th visit: Pt. requires MaxA for donning her brace, shoes, and socks. ModA with pants.   ? Time 12   ? Period Weeks   ? Status On-going   ? Target Date 01/21/22   ?  ? OT LONG TERM GOAL #5  ? Title Pt. will improve FOTO score by 2 pt. to reflect funcational change   ? Baseline Eval: FOTO score: 12, 10th visit: FOTO score 27   ? Time 12   ? Period Weeks   ? Status On-going   ? Target Date 01/20/22   ?  ? OT LONG TERM GOAL #6  ? Title Patient will transfer to bedside commode or toilet with supervision.   ? ?  ?  ? ?  ? ? ? ? ? ? ? ? Plan - 01/04/22 1228   ? ? Clinical Impression Statement Education was provided to the patient's grandson about the proper shoulder harness brace application secondary to right shoulder subluxation. Pt. Continues to improve with transfers. Pt. continues to require cues, and assist to advance her RLE, with increased assist required when turning to back up. Pt. Continues to work towards improving functional transfers for toileting. ?   ? OT Occupational Profile and History Detailed Assessment- Review of Records and additional review of physical, cognitive, psychosocial history related to current functional performance   ? Occupational performance deficits (Please refer to evaluation for details): Leisure;IADL's;ADL's   ? Body Structure / Function / Physical Skills ADL;IADL;FMC;ROM;UE functional  use;Strength;Decreased knowledge of use of DME;Coordination   ? Rehab Potential Fair   ? Clinical Decision Making Several treatment options, min-mod task modification necessary   ? Comorbidities Affecting Occupational Performance: May have comorbidities impacting occupational performance   ?  Modification or Assistance to Complete Evaluation  Min-Moderate modification of tasks or assist with assess necessary to complete eval   ? OT Frequency 2x / week   ? OT Duration 12 weeks   ? OT Treatment/Interventions Self-care/ADL training;Neuromuscular education;Electrical Stimulation;Patient/family education;Therapeutic activities;Passive range of motion;Therapist, nutritional;Therapeutic exercise;DME and/or AE instruction;Moist Heat   ? Consulted and Agree with Plan of Care Patient   ? ?  ?  ? ?  ? ? ?Patient will benefit from skilled therapeutic intervention in order to improve the following deficits and impairments:   ?Body Structure / Function / Physical Skills: ADL, IADL, FMC, ROM, UE functional use, Strength, Decreased knowledge of use of DME, Coordination ?  ?  ? ? ?Visit Diagnosis: ?Muscle weakness (generalized) ? ? ? ?Problem List ?Patient Active Problem List  ? Diagnosis Date Noted  ? Blood clotting disorder ([place]) 12/27/2021  ? Elevated AST (SGOT) 10/22/2021  ? 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 Cerebral embolism with cerebral infarction 09/23/2021  ? Pyelonephritis 09/19/2021  ? Pain due to onychomycosis of toenails of both feet 11/19/2020  ? Normocytic anemia 05/31/2020  ? Acute ischemic left MCA stroke ([place]) 04/30/2020  ? Right hemiplegia ([place]) 04/30/2020  ? Cerebrovascular accident [facility]) 04/21/2020  ? Atrial fibrillation with RVR ([place]) 04/21/2020  ? Essential  hypertension 04/21/2020  ? Alcohol abuse 04/21/2020  ? ?Harrel Carina, MS, OTR/L  ? ?Harrel Carina, OT ?01/04/2022, 12:38 PM ? ?[place] ?[facility] ?[place]

## 2022-01-06 ENCOUNTER — Encounter: Payer: Self-pay | Admitting: Occupational Therapy

## 2022-01-06 ENCOUNTER — Ambulatory Visit: Payer: Medicare Other

## 2022-01-06 ENCOUNTER — Ambulatory Visit: Payer: Medicare Other | Admitting: Speech Pathology

## 2022-01-06 ENCOUNTER — Ambulatory Visit: Payer: Medicare Other | Admitting: Occupational Therapy

## 2022-01-06 DIAGNOSIS — R2681 Unsteadiness on feet: Secondary | ICD-10-CM

## 2022-01-06 DIAGNOSIS — R482 Apraxia: Secondary | ICD-10-CM

## 2022-01-06 DIAGNOSIS — R262 Difficulty in walking, not elsewhere classified: Secondary | ICD-10-CM

## 2022-01-06 DIAGNOSIS — R269 Unspecified abnormalities of gait and mobility: Secondary | ICD-10-CM

## 2022-01-06 DIAGNOSIS — I63542 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: Secondary | ICD-10-CM

## 2022-01-06 DIAGNOSIS — R4701 Aphasia: Secondary | ICD-10-CM

## 2022-01-06 DIAGNOSIS — R278 Other lack of coordination: Secondary | ICD-10-CM

## 2022-01-06 DIAGNOSIS — M6281 Muscle weakness (generalized): Secondary | ICD-10-CM

## 2022-01-06 DIAGNOSIS — I63512 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 middle cerebral artery: Secondary | ICD-10-CM

## 2022-01-06 NOTE — Therapy (Signed)
[place] [facility] ?[place], [place], [address] ?Phone: [phone]   Fax:  [phone] ? ?Physical Therapy Treatment/Physical Therapy Progress Note ? ? ?Dates of reporting period  11/16/2021 to   01/06/2022 ? ?Patient Details  ?Name: Lindsey [name] ?MRN: [id number] ?Date of Birth: May 14, 1956 ?Referring Provider (PT): Raulkar, Clide Deutscher, MD ? ? ?Encounter Date: 01/06/2022 ? ? PT End of Session - 01/06/22 1115   ? ? Visit Number 10   ? Number of Visits 24   ? Date for PT Re-Evaluation 02/08/22   ? Authorization Time Period Cert through [id number]   ? Progress Note Due on Visit 20   ? PT Start Time 1146   ? PT Stop Time 1227   ? PT Time Calculation (min) 41 min   ? Equipment Utilized During Treatment Gait belt   ? Activity Tolerance Patient tolerated treatment well   ? Behavior During Therapy [facility] for tasks assessed/performed   ? ?  ?  ? ?  ? ? ?Past Medical History:  ?Diagnosis Date  ? Aphasia   ? 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 CVA (cerebral vascular accident) [facility])   ? Diverticulosis   ? History of ischemic left MCA stroke   ? Hypertension   ? Pain due to onychomycosis of toenails of both feet   ? 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 Uterine prolapse   ? ? ?History reviewed. No pertinent surgical history. ? ?There were no vitals filed for this visit. ? ? Subjective Assessment - 01/06/22 1113   ? ? Subjective Patient reports no pain and states she has been doing some of her seated exercises.   ? Patient is accompained by: Family member   [name]  ? Pertinent History Pt was doing very well and was walking short distances wqith a cane prior to second stroke in december. At this point she is limited to wheelchair as her main means of mobility. Pt caregiver reports all detailed information. Pt has hesitation with the left lower extremity and significant weakness on the right lower extremity. Pt will take 2-3  steps when transfering. Pt caregiver assists with all transfers.Pt has shower with ledge to steps over ledge when entering and utilizes bedside commode. Pt has 2 level home with chair lift for upstairs access. Pt would like to improve ransfers, improve neglect on her R side and improve her overall strength and mobility.Pt. is a 66 y.o. 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 Pt. has Right sided weakness, and receptive, and expressive aphasia. Pt. has had a previous CVA with right sided weakness in July 2021. PMHx includes: AFib, Renal Artery thrombosis, situational depression, HTN, Pyelnephritis, Seizure activity, COnstipation, AKI. Vitamin D deficiency, and urinary incontinence.   ? Limitations Standing;Walking;House hold activities   ? How long can you sit comfortably? n/a   ? How long can you stand comfortably? 2 min   ? How long can you walk comfortably? 3-4 steps   ? Patient Stated Goals Improve balance and functional movements   ? Currently in Pain? No/denies   ? ?  ?  ? ?  ? ? ? ?INTERVENTIONS:  ? ?Reassessed goals for progress note:  ? ? ?STG= HEP- Patient able to demonstrate - Seated kicks, marching  ? ?LTG= ? ?FOTO- 25 (progressing)  ? ?5x STS= 48 sec with CGA and  using Left UE support- Max VC and using back of leg. (Progressing)  ? ?BERG= Will reassess next visit ? ?Transfer= Patient performed stand pivot transfer  with min physical assist to stand then CGA to pivot with max VC to move Right LE and for reaching back with left hand.  (Progressing)  ? ?Gait- patient ambulated around 70 feet with hemiwalker, Min assist with use of gait belt and close w/c follow. Patient required constant VC for gait sequencing today ? ? ? ? ? ? ? ? ? ? ? ? ? ? ? ? ? ? PT Education - 01/06/22 1113   ? ? Education Details Exercise technique: safety with gait   ? Person(s) Educated Patient   ? Methods Explanation;Demonstration;Tactile cues;Verbal  cues   ? Comprehension Verbalized understanding;Verbal cues required;Tactile cues required;Returned demonstration;Need further instruction   ? ?  ?  ? ?  ? ? ? PT Short Term Goals - 01/06/22 1120   ? ?  ? PT SHORT TERM GOAL #1  ? Title Patient will be independent in home exercise program to improve strength/mobility for better functional independence with ADLs.   ? Baseline no formal HEp for LE/balance at this time   ? Time 4   ? Period Weeks   ? Status New   ? Target Date 12/14/21   ? ?  ?  ? ?  ? ? ? ? PT Long Term Goals - 01/06/22 1122   ? ?  ? PT LONG TERM GOAL #1  ? Title Patient will increase FOTO score to equal to or greater than   41  to demonstrate statistically significant improvement in mobility and quality of life.   ? Baseline 16 on 2/14; 01/06/2022=   ? Time 12   ? Period Weeks   ? Status New   ? Target Date 02/08/22   ?  ? PT LONG TERM GOAL #3  ? Title Patient  will complete five times sit to stand test in < 30 seconds with UE assistance indicating an increased LE strength and improved balance.   ? Baseline 58.47 s with UE assist and with A from PT for R LE positioning; 01/06/2022=   ? Time 12   ? Period Weeks   ? Status New   ? Target Date 02/08/22   ?  ? PT LONG TERM GOAL #4  ? Title Patient will increase Berg Balance score by > 6 points to demonstrate decreased fall risk during functional activities.   ? Baseline 12 on 2/14   ? Time 12   ? Period Weeks   ? Status New   ? Target Date 02/08/22   ?  ? PT LONG TERM GOAL #5  ? Title Pt will transfer from seated position on table/chair to/from transport chair/WC in order to indicate improved transfer ability.   ? Baseline Requires Min A for LE positioning and to prevent LOB.   ? Time 12   ? Period Weeks   ? Status New   ? Target Date 02/08/22   ? ?  ?  ? ?  ? ? ? ? ? ? ? ? Plan - 01/06/22 1117   ? ? Clinical Impression Statement Patient presents with good motivation  for today's session. She presented with improving stats today including her FOTO score  indicating an improvement in her self perceived ability. She also demonstrated improved functional Strength as seen by improved 5 time Sit to Stand. She demo less overall physical assist with transfer  and now incorporating more walking in her sessions. Patient's condition has the potential to improve in response to therapy. Maximum improvement is yet to be obtained. The anticipated improvement is attainable and reasonable in a generally predictable time.   The pt will benefit from further skilled PT to improve strength, mobility and balance to increase ease and safety with transfers and ADLs   ? Personal Factors and Comorbidities Age;Comorbidity 1;Comorbidity 2;Comorbidity 3+;Time since onset of injury/illness/exacerbation;Other   ? Comorbidities AFib, Renal Artery thrombosis, situational depression, HTN, Pyelnephritis, Seizure activity, COnstipation, AKI   ? Examination-Activity Limitations Bathing;Bed Mobility;Carry;Locomotion Level;Dressing;Sit;Squat;Stairs;Stand;Toileting;Transfers   ? Examination-Participation Restrictions Church;Community Activity;Laundry   ? Stability/Clinical Decision Making Evolving/Moderate complexity   ? Rehab Potential Fair   ? PT Frequency 2x / week   ? PT Duration 12 weeks   ? PT Treatment/Interventions ADLs/Self Care Home Management;Gait training;Stair training;Therapeutic activities;Therapeutic exercise;Balance training;Neuromuscular re-education;Patient/family education;Wheelchair mobility training;Manual techniques;Energy conservation;Dry needling;Passive range of motion;Taping;Spinal Manipulations;Joint Manipulations   ? PT Next Visit Plan Gait training, Transfer training, LE strengthening as appropriate.   ? PT Home Exercise Plan HEP: general LE and postural exercises   ? Consulted and Agree with Plan of Care Patient   ? ?  ?  ? ?  ? ? ?Patient will benefit from skilled therapeutic intervention in order to improve the following deficits and impairments:  Abnormal gait, Decreased  activity tolerance, Decreased endurance, Decreased knowledge of use of DME, Decreased range of motion, Decreased strength, Hypomobility, Impaired flexibility, Difficulty walking, Decreased balance, Decreased

## 2022-01-06 NOTE — Therapy (Signed)
[place] ?[facility] ?[place], [place], [address] ?Phone: [phone]   Fax:  [phone] ? ?Occupational Therapy Treatment/Recertification ?Occupational Therapy Progress Note ? ?Dates of reporting period 302/2023   to   01/06/2022  ?Patient Details  ?Name: Lindsey [name] ?MRN: [id number] ?Date of Birth: Mar 09, 1956 ?Referring Provider (OT): Dr. Ranell Patrick ? ? ?Encounter Date: 01/06/2022 ? ? OT End of Session - 01/06/22 1059   ? ? Visit Number 20   ? Number of Visits 24   ? Date for OT Re-Evaluation 03/31/22   ? Authorization Type Progress report period starting 12/02/2021   ? OT Start Time 1100   ? OT Stop Time 1145   ? OT Time Calculation (min) 45 min   ? Activity Tolerance Patient tolerated treatment well   ? Behavior During Therapy [facility] for tasks assessed/performed   ? ?  ?  ? ?  ? ? ?Past Medical History:  ?Diagnosis Date  ? Aphasia   ? 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 CVA (cerebral vascular accident) [facility])   ? Diverticulosis   ? History of ischemic left MCA stroke   ? Hypertension   ? Pain due to onychomycosis of toenails of both feet   ? 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 Uterine prolapse   ? ? ?History reviewed. No pertinent surgical history. ? ?There were no vitals filed for this visit. ? ? Subjective Assessment - 01/06/22 1059   ? ? Subjective  Reviewed the right shoulder harness brace application with her grandson, Lindsey [name].   ? Patient is accompanied by: Family member   ? Pertinent History Pt. is a 66 y.o. 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 Pt. has Right sided weakness, and receptive, and expressive aphasia. Pt. has had a previous CVA with right sided weakness in July 2021. PMHx includes: AFib, Renal Artery thrombosis, situational depression, HTN, Pyelnephritis, Seizure activity, COnstipation, AKI. Vitamin D  deficiency, and urinary incontinence/   ? Currently in Pain? No/denies   ? ?  ?  ? ?  ? ? ? ? ? [facility] OT Assessment - 01/06/22 1113   ? ?  ? PROM  ? Overall PROM Comments Right shoulder flexion 0(100), abduction 0(94), elbow flexion 0(0-136), wrist flexion 0(78), wrist extension 0(72)   ?  ? Strength  ? Overall Strength Comments RUE: 0/5; trace activation of elbow flexors with hand to mouth patterns.   ? ?  ?  ? ?  ? ?Therapeutic ex.: ? ?Pt. Tolerated slow gentle PROM in all joint ranges of the RUE, and hand to decrease tone, and tightness, and prepare the UE for ROM. Pt. Worked on BUE strengthening, and reciprocal motion using the UBE while seated for 8 min. with no resistance. Constant monitoring was provided. Pt. Required support at the right elbow,  and an ACE wrap in place to secure the right hand onto the handle. ? ?Measurements were obtained, and goals were reviewed with the pt. Pt. has progressed, and increased ROM for the right shoulder, elbow, and wrist motions. Pt. is making progress with self-dressing, and transfers, however continues to require assist from family. Pt.'s FOTO score is 27. Pt. continues to work on improving RUE functioning in order to improve overall ADL, and IADL functioning including: self-dressing, and safe toileting transfers. ? ? ? ? ? ? ? ? ? ? ? ? ? ? ? ? ?  OT Education - 01/06/22 1059   ? ? Education Details goals, UE functioning.   ? Person(s) Educated Patient   ? Methods Explanation;Verbal cues;Tactile cues   ? Comprehension Verbal cues required;Need further instruction;Returned demonstration;Tactile cues required   ? ?  ?  ? ?  ? ? ? OT Short Term Goals - 01/06/22 1117   ? ?  ? OT SHORT TERM GOAL #1  ? Title Pt and caregiver will be independent with HEP for RUE   ? Baseline Eval: No current program. 10th visit: Caregivers  report being comfortable with exercises at home. 01/06/2022: Continue ongoing HEPs for UE   ? Time 6   ? Period Weeks   ? Status On-going   ? Target Date  02/17/22   ? ?  ?  ? ?  ? ? ? ? OT Long Term Goals - 01/06/22 1120   ? ?  ? OT LONG TERM GOAL #1  ? Title Pt. will engage engage the RUE as a gross assist/atabilizer during ADLs 100% of the time   ? Baseline 01/06/2022: Pt. is starting to engage her RUE as a gross stabiliizer with increased cues. Eval: pt. does not currently engage her RUE during ADLs. 10th visit: Pt. continues to need to work towards using her RUE as a gross stabilizer.   ? Time 12   ? Period Weeks   ? Status On-going   ? Target Date 03/31/22   ?  ? OT LONG TERM GOAL #2  ? Title Pt. will perform UE dressing using one armed dressing techniques   ? Baseline Eval: MaxA, 10th visit: ModA 01/06/2022: min-modA   ? Time 12   ? Period Weeks   ? Status On-going   ? Target Date 03/31/22   ?  ? OT LONG TERM GOAL #3  ? Title Pt. will require minA to stand and hike pants   ? Baseline Eval: MaxA, 10th visit: ModA 01/06/2022: ModA   ? Time 12   ? Period Weeks   ? Target Date 03/31/22   ?  ? OT LONG TERM GOAL #4  ? Title Pt. will perform LE dressing with minA   ? Baseline Eval: MaxA 10th visit: Pt. requires MaxA for donning her brace, shoes, and socks. ModA with pants. 01/06/2022: ModA pants, MaxA shoes, socks, and brace   ? Time 12   ? Period Weeks   ? Status On-going   ? Target Date 01/21/22   ?  ? OT LONG TERM GOAL #5  ? Title Pt. will improve FOTO score by 2 pt. to reflect funcational change   ? Baseline Eval: FOTO score: 12, 10th visit: FOTO score 27 01/06/2022: 27   ? Time 12   ? Period Weeks   ? Status On-going   ? Target Date 03/31/22   ?  ? OT LONG TERM GOAL #6  ? Title Patient will transfer to bedside commode or toilet with supervision.   ? Baseline 01/06/2022: min-ModA   ? Time 4   ? Period Weeks   ? Status On-going   ? Target Date 03/31/22   ? ?  ?  ? ?  ? ? ? ? ? ? ? ? Plan - 01/06/22 1102   ? ? Clinical Impression Statement Measurements were obtained, and goals were reviewed with the pt. Pt. has progressed, and increased ROM for the right shoulder, elbow,  and wrist motions. Pt. Is making progress with self-dressing, and transfers, however continues to require assist from  family. Pt.'s FOTO score is 27. Pt. continues to work on improving RUE functioning in order to improve overall ADL, and IADL functioning including self-dressing, and safe toileting transfers. ?  ? OT Occupational Profile and History Detailed Assessment- Review of Records and additional review of physical, cognitive, psychosocial history related to current functional performance   ? Occupational performance deficits (Please refer to evaluation for details): Leisure;IADL's;ADL's   ? Body Structure / Function / Physical Skills ADL;IADL;FMC;ROM;UE functional use;Strength;Decreased knowledge of use of DME;Coordination   ? Rehab Potential Fair   ? Clinical Decision Making Several treatment options, min-mod task modification necessary   ? Comorbidities Affecting Occupational Performance: May have comorbidities impacting occupational performance   ? Modification or Assistance to Complete Evaluation  Min-Moderate modification of tasks or assist with assess necessary to complete eval   ? OT Frequency 2x / week   ? OT Duration 12 weeks   ? OT Treatment/Interventions Self-care/ADL training;Neuromuscular education;Electrical Stimulation;Patient/family education;Therapeutic activities;Passive range of motion;Therapist, nutritional;Therapeutic exercise;DME and/or AE instruction;Moist Heat   ? Consulted and Agree with Plan of Care Patient   ? ?  ?  ? ?  ? ? ?Patient will benefit from skilled therapeutic intervention in order to improve the following deficits and impairments:   ?Body Structure / Function / Physical Skills: ADL, IADL, FMC, ROM, UE functional use, Strength, Decreased knowledge of use of DME, Coordination ?  ?  ? ? ?Visit Diagnosis: ?Muscle weakness (generalized) ? ? ? ?Problem List ?Patient Active Problem List  ? Diagnosis Date Noted  ? Blood clotting disorder ([place]) 12/27/2021  ? Elevated AST  (SGOT) 10/22/2021  ? 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)   ? Cerebral infarction

## 2022-01-07 NOTE — Therapy (Signed)
[place] ?[facility] ?[place], [place], [address] ?Phone: [phone]   Fax:  [phone] ? ?Speech Language Pathology Treatment ? ?Patient Details  ?Name: Lindsey [name] ?MRN: [id number] ?Date of Birth: Oct 17, 1955 ?Referring Provider (SLP): Dr. Ranell Patrick ? ? ?Encounter Date: 01/06/2022 ? ? End of Session - 01/07/22 1444   ? ? Visit Number 16   ? Number of Visits 25   ? Date for SLP Re-Evaluation 01/26/22   ? Authorization Type Medicare/ medicaid secondary   ? SLP Start Time 1000   ? SLP Stop Time  1100   ? SLP Time Calculation (min) 60 min   ? Activity Tolerance Patient tolerated treatment well   ? ?  ?  ? ?  ? ? ?Past Medical History:  ?Diagnosis Date  ? Aphasia   ? 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 CVA (cerebral vascular accident) [facility])   ? Diverticulosis   ? History of ischemic left MCA stroke   ? Hypertension   ? Pain due to onychomycosis of toenails of both feet   ? 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 Uterine prolapse   ? ? ?No past surgical history on file. ? ?There were no vitals filed for this visit. ? ? Subjective Assessment - 01/07/22 1437   ? ? Subjective Pt friendly, eager to participate in session with new therapist, pt attended session by herself   ? Currently in Pain? No/denies   ? ?  ?  ? ?  ? ? ? ? ? ? ? ? ADULT SLP TREATMENT - 01/07/22 0001   ? ?  ? Treatment Provided  ? Treatment provided Cognitive-Linquistic   ?  ? Cognitive-Linquistic Treatment  ? Treatment focused on Aphasia;Apraxia   ? Skilled Treatment Pt attended session alone and this Probation officer is unfamiliar with pt. Therefore uncertain of the validity of some responses when using her SGD.  ? ?When asked: ? ?"WHAT DID YOU EAT FOR BREAKFAST?" pt independently located the breakfast folder and selected all 3 items listed.  ? ?"WHAT DO YOU WANT FOR LUNCH?" pt located lunch folder and selected hamburger.  ? ?"CAN YOU FIND FRUITS?"  pt required maximal assistance and depsite providing repetition, pt was not able to locate folder  ? ?Pt required max multimodal cues to answer the following questions with her device: ?"HOW WOULD YOU ASK TO TO THE BATHROOM?" ?"BRUSH YOUR HAIR?" ?"ASK FOR A TISSUE?" ?"ASK FOR SOME VASELINE?" ? ? ?SLP further facilitated session by adding a new folder describing today's session for pt to use when her primary therapist returns. With moderate verbal and visual cues, pt able to produce novel sentences to be recorded on device with 90% speech intelligibility.   ? ?  ?  ? ?  ? ? ? SLP Education - 01/07/22 1444   ? ? Education Details using device with OT and PT   ? Person(s) Educated Patient   ? Methods Explanation;Demonstration   ? Comprehension Need further instruction   ? ?  ?  ? ?  ? ? ? SLP Short Term Goals - 12/13/21 0924   ? ?  ? SLP SHORT TERM GOAL #1  ? Title Pt will answer personally relevant Y/N questions in context using multimodal communication or AAC if necessary, 75% accuracy.   ? Time 10   ? Period --   sessions  ? Status Achieved   ?  ? SLP  SHORT TERM GOAL #2  ? Title Given verbal or visual prompt, pt will correctly ID written word/object/icon in field of 4-6 to communicate wants/needs with 75% accuracy given occasional mod A over 2 sessions   ? Baseline F:3 12/09/21   ? Time 10   ? Period --   visits  ? Status Not Met   ?  ? SLP SHORT TERM GOAL #3  ? Title Pt will verbally approximate 3 significant words for 5/10 trials given occasional fading to rare mod A over 2 sessions   ? Time 10   ? Period --   sessions  ? Status Achieved   ? ?  ?  ? ?  ? ? ? SLP Long Term Goals - 12/13/21 0930   ? ?  ? SLP LONG TERM GOAL #1  ? Title Pt will use multimodal communication system with mod assist to express wants/needs given >2 choices over 2 sessions for 4/5 opportunities   ? Time 12   ? Period Weeks   ? Status On-going   ?  ? SLP LONG TERM GOAL #2  ? Title Caregiver will demonstrate appropriate use of supported  conversation and use of multimodal supports to aid pt's comprehension of personally relevant topics, over 4 sessions.   ? Time 12   ? Period Weeks   ? Status On-going   ?  ? SLP LONG TERM GOAL #3  ? Title Caregiver will appropriately cue patient when apraxic/aphasic errors occurs for 4/5 opportunities over 2 sessions   ? Time 12   ? Period Weeks   ? Status On-going   ? ?  ?  ? ?  ? ? ? Plan - 01/07/22 1444   ? ? Clinical Impression Statement Dimple presents with severe aphasia and severe verbal apraxia, chronic deficits exacerbated by new CVA in December 2022. Pt demonstrated some independence with device navigation but required consistent cues to locate requested icons as well as scan for icons vertically. Plan to continue providing training to increase independent use/initiation with device to communicate. Recommend skilled ST with focus on functional communication to improve ability to communicate wants/needs, reduce frustration/social isolation and improve quality of life.   ? Speech Therapy Frequency 2x / week   ? Duration 12 weeks   ? Treatment/Interventions Compensatory strategies;Patient/family education;Functional tasks;Cueing hierarchy;Environmental controls;Cognitive reorganization;Multimodal communcation approach;Language facilitation;Compensatory techniques;Internal/external aids;SLP instruction and feedback;Other (comment)   ? Potential to [place]   ? Potential Considerations Severity of impairments;Previous level of function   ? Consulted and Agree with Plan of Care Patient   ? ?  ?  ? ?  ? ? ?Patient will benefit from skilled therapeutic intervention in order to improve the following deficits and impairments:   ?Aphasia ? ?Apraxia ? ? ? ?Problem List ?Patient Active Problem List  ? Diagnosis Date Noted  ? Blood clotting disorder ([place]) 12/27/2021  ? Elevated AST (SGOT) 10/22/2021  ? 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 Cerebral embolism with cerebral infarction 09/23/2021  ? Pyelonephritis 09/19/2021  ? Pain due to onychomycosis of toenails of both feet 11/19/2020  ? Normocytic anemia 05/31/2020  ? Acute ischemic left MCA stroke ([place]) 04/30/2020  ? Right hemiplegia ([place]) 04/30/2020  ? Cerebrovascular accident [facility]) 04/21/2020  ? Atrial fibrillation with RVR (  [place]) 04/21/2020  ? Essential hypertension 04/21/2020  ? Alcohol abuse 04/21/2020  ? ?Masayo Fera B. Rutherford Nail, M.S., CCC-SLP, CBIS ?Speech-Language Pathologist ?Rehabilitation Services ?Office [phone] ? ?[place], Mary [place] ?01/07/2022, 2:47 PM ? ?[place] [facility] ?[place], [place], [address] ?Phone: [phone]   Fax:  [phone] ? ? ?Name: Mya Suell ?MRN: [id number] ?Date of Birth: 1956/02/25 ? ?

## 2022-01-10 ENCOUNTER — Ambulatory Visit: Payer: Medicare Other | Admitting: Physical Medicine and Rehabilitation

## 2022-01-11 ENCOUNTER — Ambulatory Visit: Payer: Medicare Other

## 2022-01-11 ENCOUNTER — Encounter: Payer: Self-pay | Admitting: Occupational Therapy

## 2022-01-11 ENCOUNTER — Ambulatory Visit: Payer: Medicare Other | Admitting: Occupational Therapy

## 2022-01-11 ENCOUNTER — Ambulatory Visit: Payer: Medicare Other | Admitting: Speech Pathology

## 2022-01-11 DIAGNOSIS — M6281 Muscle weakness (generalized): Secondary | ICD-10-CM | POA: Diagnosis not present

## 2022-01-11 DIAGNOSIS — I63542 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: Secondary | ICD-10-CM

## 2022-01-11 DIAGNOSIS — I63512 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 middle cerebral artery: Secondary | ICD-10-CM

## 2022-01-11 DIAGNOSIS — R262 Difficulty in walking, not elsewhere classified: Secondary | ICD-10-CM

## 2022-01-11 DIAGNOSIS — R278 Other lack of coordination: Secondary | ICD-10-CM

## 2022-01-11 DIAGNOSIS — R269 Unspecified abnormalities of gait and mobility: Secondary | ICD-10-CM

## 2022-01-11 DIAGNOSIS — R482 Apraxia: Secondary | ICD-10-CM

## 2022-01-11 DIAGNOSIS — R4701 Aphasia: Secondary | ICD-10-CM

## 2022-01-11 DIAGNOSIS — R2681 Unsteadiness on feet: Secondary | ICD-10-CM

## 2022-01-11 NOTE — Therapy (Signed)
[place] [facility] ?[place], [place], [address] ?Phone: [phone]   Fax:  [phone] ? ?Physical Therapy Treatment ? ?Patient Details  ?Name: Lindsey [name] ?MRN: [id number] ?Date of Birth: 1956-07-18 ?Referring Provider (PT): Raulkar, Clide Deutscher, MD ? ? ?Encounter Date: 01/11/2022 ? ? PT End of Session - 01/11/22 0941   ? ? Visit Number 11   ? Number of Visits 24   ? Date for PT Re-Evaluation 02/08/22   ? Authorization Time Period Cert through [id number]   ? Progress Note Due on Visit 20   ? PT Start Time [phone]   ? PT Stop Time 1014   ? PT Time Calculation (min) 40 min   ? Equipment Utilized During Treatment Gait belt   ? Activity Tolerance Patient tolerated treatment well   ? Behavior During Therapy [facility] for tasks assessed/performed   ? ?  ?  ? ?  ? ? ?Past Medical History:  ?Diagnosis Date  ? Aphasia   ? 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 CVA (cerebral vascular accident) [facility])   ? Diverticulosis   ? History of ischemic left MCA stroke   ? Hypertension   ? Pain due to onychomycosis of toenails of both feet   ? 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 Uterine prolapse   ? ? ?History reviewed. No pertinent surgical history. ? ?There were no vitals filed for this visit. ? ? Subjective Assessment - 01/11/22 0940   ? ? Subjective Patient reports having a good Easter weekend. Reports no falls and no pain today.   ? Patient is accompained by: Family member   [name]  ? Pertinent History Pt was doing very well and was walking short distances wqith a cane prior to second stroke in december. At this point she is limited to wheelchair as her main means of mobility. Pt caregiver reports all detailed information. Pt has hesitation with the left lower extremity and significant weakness on the right lower extremity. Pt will take 2-3 steps when transfering. Pt caregiver assists with all transfers.Pt has shower with ledge  to steps over ledge when entering and utilizes bedside commode. Pt has 2 level home with chair lift for upstairs access. Pt would like to improve ransfers, improve neglect on her R side and improve her overall strength and mobility.Pt. is a 66 y.o. 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 Pt. has Right sided weakness, and receptive, and expressive aphasia. Pt. has had a previous CVA with right sided weakness in July 2021. PMHx includes: AFib, Renal Artery thrombosis, situational depression, HTN, Pyelnephritis, Seizure activity, COnstipation, AKI. Vitamin D deficiency, and urinary incontinence.   ? Limitations Standing;Walking;House hold activities   ? How long can you sit comfortably? n/a   ? How long can you stand comfortably? 2 min   ? How long can you walk comfortably? 3-4 steps   ? Patient Stated Goals Improve balance and functional movements   ? Currently in Pain? No/denies   ? ?  ?  ? ?  ? ? ? ?INTERVENTIONS  ? ?Therex:  ? ?Seated hamstring curl with RTB 2 sets x 12 reps ?Standing Hip march BLE x 12 reps each ?Standing hip ABD BLE x 12 reps each  ?Static stand x 2 min focusing on more 50% weight bearing. ? ? ?Therapeutic Activities ? ?Sit to stand- Max  VC, TC and visual demo for correct technique x 10 reps in total- Patient continues to have difficulty with right LE placement and reaching for walker or support bar with left UE rather than pushing up. She did improve with practice- Yet max cues to reach back from stand to sit for left UE.  ? ?Gait training:  ? ?Patient ambulated approx 80 feet today with hemiwalker on left side, CGA with use of gait belt. Max VC for gait sequencing with patient exhibiting very narrow BOS with difficulty with right LE placement and placement of walker. She was able to improve with multi modal cues and able to progress to short reciprocal steps.  ? ?Education provided throughout session via VC/TC and  demonstration to facilitate movement at target joints and correct muscle activation for all testing and exercises performed.  ? ? ? ? ? ? ? ? ? ? ? ? ? ? ? ? ? ? ? ? ? ? ? PT Education - 01/11/22 0941   ? ? Education Details Exercise technique   ? Person(s) Educated Patient   ? Methods Explanation;Demonstration;Tactile cues;Verbal cues   ? Comprehension Verbalized understanding;Returned demonstration;Verbal cues required;Tactile cues required;Need further instruction   ? ?  ?  ? ?  ? ? ? PT Short Term Goals - 01/06/22 1120   ? ?  ? PT SHORT TERM GOAL #1  ? Title Patient will be independent in home exercise program to improve strength/mobility for better functional independence with ADLs.   ? Baseline no formal HEp for LE/balance at this time   ? Time 4   ? Period Weeks   ? Status New   ? Target Date 12/14/21   ? ?  ?  ? ?  ? ? ? ? PT Long Term Goals - 01/06/22 1122   ? ?  ? PT LONG TERM GOAL #1  ? Title Patient will increase FOTO score to equal to or greater than   41  to demonstrate statistically significant improvement in mobility and quality of life.   ? Baseline 16 on 2/14; 01/06/2022=   ? Time 12   ? Period Weeks   ? Status New   ? Target Date 02/08/22   ?  ? PT LONG TERM GOAL #3  ? Title Patient  will complete five times sit to stand test in < 30 seconds with UE assistance indicating an increased LE strength and improved balance.   ? Baseline 58.47 s with UE assist and with A from PT for R LE positioning; 01/06/2022=   ? Time 12   ? Period Weeks   ? Status New   ? Target Date 02/08/22   ?  ? PT LONG TERM GOAL #4  ? Title Patient will increase Berg Balance score by > 6 points to demonstrate decreased fall risk during functional activities.   ? Baseline 12 on 2/14   ? Time 12   ? Period Weeks   ? Status New   ? Target Date 02/08/22   ?  ? PT LONG TERM GOAL #5  ? Title Pt will transfer from seated position on table/chair to/from transport chair/WC in order to indicate improved transfer ability.   ? Baseline Requires  Min A for LE positioning and to prevent LOB.   ? Time 12   ? Period Weeks   ? Status New   ? Target Date 02/08/22   ? ?  ?  ? ?  ? ? ? ? ? ? ? ?  Plan - 01/11/22 0941   ? ? Clinical Impression Statement Patient presents with good motivation for today's interventions. She was able to participate in therex including increased standing LE strengthening exercises today. She continues to struggle with safe transfers - lack of control with stand to sit - falling back into chair at times but does respond to max VC for correct technique. The pt will benefit from further skilled PT to improve strength, mobility and balance to increase ease and safety with transfers and ADLs   ? Personal Factors and Comorbidities Age;Comorbidity 1;Comorbidity 2;Comorbidity 3+;Time since onset of injury/illness/exacerbation;Other   ? Comorbidities AFib, Renal Artery thrombosis, situational depression, HTN, Pyelnephritis, Seizure activity, COnstipation, AKI   ? Examination-Activity Limitations Bathing;Bed Mobility;Carry;Locomotion Level;Dressing;Sit;Squat;Stairs;Stand;Toileting;Transfers   ? Examination-Participation Restrictions Church;Community Activity;Laundry   ? Stability/Clinical Decision Making Evolving/Moderate complexity   ? Rehab Potential Fair   ? PT Frequency 2x / week   ? PT Duration 12 weeks   ? PT Treatment/Interventions ADLs/Self Care Home Management;Gait training;Stair training;Therapeutic activities;Therapeutic exercise;Balance training;Neuromuscular re-education;Patient/family education;Wheelchair mobility training;Manual techniques;Energy conservation;Dry needling;Passive range of motion;Taping;Spinal Manipulations;Joint Manipulations   ? PT Next Visit Plan Gait training, Transfer training, LE strengthening as appropriate.   ? PT Home Exercise Plan HEP: general LE and postural exercises   ? Consulted and Agree with Plan of Care Patient   ? ?  ?  ? ?  ? ? ?Patient will benefit from skilled therapeutic intervention in order to  improve the following deficits and impairments:  Abnormal gait, Decreased activity tolerance, Decreased endurance, Decreased knowledge of use of DME, Decreased range of motion, Decreased strength, Hypo

## 2022-01-11 NOTE — Therapy (Deleted)
[place] [facility] ?[place], [place], [address] ?Phone: [phone]   Fax:  [phone] ? ?Speech Language Pathology Treatment ? ?Patient Details  ?Name: Lindsey [name] ?MRN: [id number] ?Date of Birth: 12-20-1955 ?Referring Provider (SLP): Dr. Ranell [name] ? ? ?Encounter Date: 01/11/2022 ? ? End of Session - 01/11/22 1512   ? ? Visit Number 17   ? Number of Visits 25   ? Date for SLP Re-Evaluation 01/26/22   ? Authorization Type Medicare/ medicaid secondary   ? SLP Start Time 1100   ? SLP Stop Time  1158   ? SLP Time Calculation (min) 58 min   ? Activity Tolerance Patient tolerated treatment well   ? ?  ?  ? ?  ? ? ?Past Medical History:  ?Diagnosis Date  ? Aphasia   ? 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 CVA (cerebral vascular accident) [facility])   ? Diverticulosis   ? History of ischemic left MCA stroke   ? Hypertension   ? Pain due to onychomycosis of toenails of both feet   ? 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 Uterine prolapse   ? ? ?No past surgical history on file. ? ?There were no vitals filed for this visit. ? ? Subjective Assessment - 01/11/22 1231   ? ? Subjective "I'm doing alright"   ? Patient is accompained by: Family member   Lindsey [name] present for beginning of session  ? Currently in Pain? No/denies   ? Pain Score 0-No pain   ? ?  ?  ? ?  ? ? ? ? ? ? ? ? ADULT SLP TREATMENT - 01/11/22 1240   ? ?  ? General Information  ? Behavior/Cognition Alert;Cooperative;Pleasant mood   ? HPI Patient is a 66 y.o. female with prior history of CVA in 2021 with residual aphasia (using Lingraphica AAC device to assist with communication as of 08/2021) referred by Dr. Ranell [name] after recent hospital admission when she was found to have new L AICA infarct complicated by pyelonephritis. Admitted to [facility] and then CIR 12/18-12/30/22. MRI 09/22/21 showed acute moderate size L AICA cerebellar infarct, underlying  extensive chronic left MCA encephalomalacia related to 2021 infarct, left brainstem Wallerian degeneration and chronic left PICA cerebellar infarcts.   ?  ? Cognitive-Linquistic Treatment  ? Treatment focused on Aphasia;Apraxia   ? Skilled Treatment Pt used device to identify family members she saw for Easter from F:3-F:6 with ~70% acc with mod cues. With min-mod gestural cues, pt navigated her device to select icons about her last therapy session with another therapist (I missed you, how was your day off?). Pt selected icons to answer simple personal questions with mod A for navigating the home and back button. Pt participated in adding icons to leisure folder to make comments during an [employer] game. Demonstrated opportunities to use these icons during the game. While playing UNO, pt selected icons on 11 opportunities with mod A. Noted towards end of game, pt gesturing towards icons independently, but required cues to select it. Throughout therapy tasks, pt with frequent occurences of spontaneous speech.   ?  ? Assessment / Recommendations / Plan  ? Plan Continue with current plan of care   ?  ? Progression Toward Goals  ? Progression toward goals Progressing toward goals   ? ?  ?  ? ?  ? ? ? ? ? SLP Short Term Goals - 12/13/21  0924   ? ?  ? SLP SHORT TERM GOAL #1  ? Title Pt will answer personally relevant Y/N questions in context using multimodal communication or AAC if necessary, 75% accuracy.   ? Time 10   ? Period --   sessions  ? Status Achieved   ?  ? SLP SHORT TERM GOAL #2  ? Title Given verbal or visual prompt, pt will correctly ID written word/object/icon in field of 4-6 to communicate wants/needs with 75% accuracy given occasional mod A over 2 sessions   ? Baseline F:3 12/09/21   ? Time 10   ? Period --   visits  ? Status Not Met   ?  ? SLP SHORT TERM GOAL #3  ? Title Pt will verbally approximate 3 significant words for 5/10 trials given occasional fading to rare mod A over 2 sessions   ? Time 10   ? Period --    sessions  ? Status Achieved   ? ?  ?  ? ?  ? ? ? SLP Long Term Goals - 12/13/21 0930   ? ?  ? SLP LONG TERM GOAL #1  ? Title Pt will use multimodal communication system with mod assist to express wants/needs given >2 choices over 2 sessions for 4/5 opportunities   ? Time 12   ? Period Weeks   ? Status On-going   ?  ? SLP LONG TERM GOAL #2  ? Title Caregiver will demonstrate appropriate use of supported conversation and use of multimodal supports to aid pt's comprehension of personally relevant topics, over 4 sessions.   ? Time 12   ? Period Weeks   ? Status On-going   ?  ? SLP LONG TERM GOAL #3  ? Title Caregiver will appropriately cue patient when apraxic/aphasic errors occurs for 4/5 opportunities over 2 sessions   ? Time 12   ? Period Weeks   ? Status On-going   ? ?  ?  ? ?  ? ? ? Plan - 01/11/22 1519   ? ? Clinical Impression Statement Lindsey [name] presents with severe aphasia and severe verbal apraxia, chronic deficits exacerbated by new CVA in December 2022.  Pt navigating the device with assistance and benefits from gestural cues to locate icons. Pt continues to increase productions of spontaneous speech. Continue to provide opportunities for pt to use multimodal communication. Pt would benefit from further training to increase independent use/initiation with device to communicate. Recommend skilled ST with focus on functional communication to improve ability to communicate wants/needs, reduce frustration/social isolation and improve quality of life.   ? Speech Therapy Frequency 2x / week   ? Duration 12 weeks   ? Treatment/Interventions Compensatory strategies;Patient/family education;Functional tasks;Cueing hierarchy;Environmental controls;Cognitive reorganization;Multimodal communcation approach;Language facilitation;Compensatory techniques;Internal/external aids;SLP instruction and feedback;Other (comment)   ? Potential to [place]   ? Potential Considerations Severity of impairments;Previous level  of function   ? SLP Home Exercise Plan bring Lingraphica to next session   ? Consulted and Agree with Plan of Care Patient;Family member/caregiver   ? Family Member Consulted daughter   ? ?  ?  ? ?  ? ? ?Patient will benefit from skilled therapeutic intervention in order to improve the following deficits and impairments:   ?Aphasia ? ?Verbal apraxia ? ? ? ?Problem List ?Patient Active Problem List  ? Diagnosis Date Noted  ? Blood clotting disorder ([place]) 12/27/2021  ? Elevated AST (SGOT) 10/22/2021  ? 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 Cerebral embolism with cerebral infarction 09/23/2021  ? Pyelonephritis 09/19/2021  ? Pain due to onychomycosis of toenails of both feet 11/19/2020  ? Normocytic anemia 05/31/2020  ? Acute ischemic left MCA stroke ([place]) 04/30/2020  ? Right hemiplegia ([place]) 04/30/2020  ? Cerebrovascular accident [facility]) 04/21/2020  ? Atrial fibrillation with RVR ([place]) 04/21/2020  ? Essential hypertension 04/21/2020  ? Alcohol abuse 04/21/2020  ?Caryl Comes Smith-Sneed SLP Student   ? ?[place] Smith-Sneed, [place] ?01/11/2022, 3:38 PM ? ?[place] [facility] ?[place], [place], [address] ?Phone: [phone]   Fax:  [phone] ? ? ?Name: Lindsey [name] ?MRN: [id number] ?Date of Birth: 28-Oct-1955 ? ?

## 2022-01-11 NOTE — Therapy (Signed)
[place] ?[facility] ?[place], [place], [address] ?Phone: [phone]   Fax:  [phone] ? ?Occupational Therapy Treatment ? ?Patient Details  ?Name: Lindsey [name] ?MRN: [id number] ?Date of Birth: 1955-11-03 ?Referring Provider (OT): Dr. Ranell Patrick ? ? ?Encounter Date: 01/11/2022 ? ? OT End of Session - 01/11/22 1157   ? ? Visit Number 21   ? Number of Visits 24   ? Date for OT Re-Evaluation 03/31/22   ? OT Start Time 1015   ? OT Stop Time 1100   ? OT Time Calculation (min) 45 min   ? Activity Tolerance Patient tolerated treatment well   ? Behavior During Therapy [facility] for tasks assessed/performed   ? ?  ?  ? ?  ? ? ?Past Medical History:  ?Diagnosis Date  ? Aphasia   ? 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 CVA (cerebral vascular accident) [facility])   ? Diverticulosis   ? History of ischemic left MCA stroke   ? Hypertension   ? Pain due to onychomycosis of toenails of both feet   ? 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 Uterine prolapse   ? ? ?History reviewed. No pertinent surgical history. ? ?There were no vitals filed for this visit. ? ? Subjective Assessment - 01/11/22 1157   ? ? Subjective  Reviewed the right shoulder harness brace application with her grandson,Lindsey [name].  ? Patient is accompanied by: Family member   ? Pertinent History Pt. is a 66 y.o. 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 Pt. has Right sided weakness, and receptive, and expressive aphasia. Pt. has had a previous CVA with right sided weakness in July 2021. PMHx includes: AFib, Renal Artery thrombosis, situational depression, HTN, Pyelnephritis, Seizure activity, COnstipation, AKI. Vitamin D deficiency, and urinary incontinence/   ? Currently in Pain? No/denies   ? ?  ?  ? ?  ? ?OT TREATMENT   ? ?Manual Therapy: ? ?Pt. Tolerated soft tissue massage to the right  scapular musculature. Pt. tolerated scapular mobilizations in elevation, depression, abduction, and rotation following moist heat modality to the right shoulder. Pt. Tolerated soft tissue mobilizations for radius on ulna, and metacarpal spread stretches to decrease tightness, and prepare the UE, and hand for ROM and therapeutic Ex. ?  ?Therapeutic Exercise: ?  ?Pt. tolerated PROM through all joint ranges of the RUE, and hand. Pt. performed self-ROM to the right MPs, PIPs, and DIPs. Pt. worked on [employer], and reciprocal motion using the UBE while seated for 8 min. with no resistance. Constant monitoring was provided, support at the right elbow, and an ACE wrap was used to secure her right hand onto the handle. ? ?Pt. presented with increased tone, and tightness throughout the RUE, and hand.  Pt. required verbal, tactile, and visual cues using a mirror during ROM for the RUE. Pt. responded well to the session with less tone, and tightness noted proximally following ROM, moist heat, and manual therapy. Pt. continues to work on normalizing tone, and facilitating active volitional movement in the RUE, and hand to improve engagement of the RUE during ADLs, as well as improve transfers, and functional mobility for toileting.  ?  ?  ? ? ? ? ? ? ? ? ? ? ? ? ? ? ? ? ? ? ? ? ? OT Education - 01/11/22 1157   ? ?  Education Details goals, UE functioning.   ? Person(s) Educated Patient   ? Methods Explanation;Verbal cues;Tactile cues   ? Comprehension Verbal cues required;Need further instruction;Returned demonstration;Tactile cues required   ? ?  ?  ? ?  ? ? ? OT Short Term Goals - 01/06/22 1117   ? ?  ? OT SHORT TERM GOAL #1  ? Title Pt and caregiver will be independent with HEP for RUE   ? Baseline Eval: No current program. 10th visit: Caregivers  report being comfortable with exercises at home. 01/06/2022: Continue ongoing HEPs for UE   ? Time 6   ? Period Weeks   ? Status On-going   ? Target Date 02/17/22   ? ?  ?   ? ?  ? ? ? ? OT Long Term Goals - 01/06/22 1120   ? ?  ? OT LONG TERM GOAL #1  ? Title Pt. will engage engage the RUE as a gross assist/atabilizer during ADLs 100% of the time   ? Baseline 01/06/2022: Pt. is starting to engage her RUE as a gross stabiliizer with increased cues. Eval: pt. does not currently engage her RUE during ADLs. 10th visit: Pt. continues to need to work towards using her RUE as a gross stabilizer.   ? Time 12   ? Period Weeks   ? Status On-going   ? Target Date 03/31/22   ?  ? OT LONG TERM GOAL #2  ? Title Pt. will perform UE dressing using one armed dressing techniques   ? Baseline Eval: MaxA, 10th visit: ModA 01/06/2022: min-modA   ? Time 12   ? Period Weeks   ? Status On-going   ? Target Date 03/31/22   ?  ? OT LONG TERM GOAL #3  ? Title Pt. will require minA to stand and hike pants   ? Baseline Eval: MaxA, 10th visit: ModA 01/06/2022: ModA   ? Time 12   ? Period Weeks   ? Target Date 03/31/22   ?  ? OT LONG TERM GOAL #4  ? Title Pt. will perform LE dressing with minA   ? Baseline Eval: MaxA 10th visit: Pt. requires MaxA for donning her brace, shoes, and socks. ModA with pants. 01/06/2022: ModA pants, MaxA shoes, socks, and brace   ? Time 12   ? Period Weeks   ? Status On-going   ? Target Date 01/21/22   ?  ? OT LONG TERM GOAL #5  ? Title Pt. will improve FOTO score by 2 pt. to reflect funcational change   ? Baseline Eval: FOTO score: 12, 10th visit: FOTO score 27 01/06/2022: 27   ? Time 12   ? Period Weeks   ? Status On-going   ? Target Date 03/31/22   ?  ? OT LONG TERM GOAL #6  ? Title Patient will transfer to bedside commode or toilet with supervision.   ? Baseline 01/06/2022: min-ModA   ? Time 4   ? Period Weeks   ? Status On-going   ? Target Date 03/31/22   ? ?  ?  ? ?  ? ? ? ? ? ? ? ? Plan - 01/11/22 1158   ? ? Clinical Impression Statement Pt. presented with increased tone, and tightness throughout the RUE, and hand.  Pt. required verbal, tactile, and visual cues using a mirror during ROM  for the RUE. Pt. responded well to the session with less tone, and tightness noted proximally following ROM, moist heat, and manual  therapy. Pt. continues to work on normalizing tone, and facilitating active volitional movement in the RUE, and hand to improve engagement of the RUE during ADLs, as well as improve transfers, and functional mobility for toileting. ?   ? OT Occupational Profile and History Detailed Assessment- Review of Records and additional review of physical, cognitive, psychosocial history related to current functional performance   ? Occupational performance deficits (Please refer to evaluation for details): Leisure;IADL's;ADL's   ? Body Structure / Function / Physical Skills ADL;IADL;FMC;ROM;UE functional use;Strength;Decreased knowledge of use of DME;Coordination   ? Rehab Potential Fair   ? Clinical Decision Making Several treatment options, min-mod task modification necessary   ? Comorbidities Affecting Occupational Performance: May have comorbidities impacting occupational performance   ? Modification or Assistance to Complete Evaluation  Min-Moderate modification of tasks or assist with assess necessary to complete eval   ? OT Frequency 2x / week   ? OT Duration 12 weeks   ? OT Treatment/Interventions Self-care/ADL training;Neuromuscular education;Electrical Stimulation;Patient/family education;Therapeutic activities;Passive range of motion;Therapist, nutritional;Therapeutic exercise;DME and/or AE instruction;Moist Heat   ? Consulted and Agree with Plan of Care Patient   ? ?  ?  ? ?  ? ? ?Patient will benefit from skilled therapeutic intervention in order to improve the following deficits and impairments:   ?Body Structure / Function / Physical Skills: ADL, IADL, FMC, ROM, UE functional use, Strength, Decreased knowledge of use of DME, Coordination ?  ?  ? ? ?Visit Diagnosis: ?Muscle weakness (generalized) ? ? ? ?Problem List ?Patient Active Problem List  ? Diagnosis Date Noted  ? Blood  clotting disorder ([place]) 12/27/2021  ? Elevated AST (SGOT) 10/22/2021  ? 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)

## 2022-01-11 NOTE — Therapy (Signed)
[facility] ?[facility] ?[place], [place], [address] ?Phone: [phone]   Fax:  [phone] ? ?Speech Language Pathology Treatment ? ?Patient Details  ?Name: Lindsey [name] ?MRN: [id number] ?Date of Birth: [birth year] ?Referring Provider (SLP): Dr. Ranell Patrick ? ? ?Encounter Date: 01/11/2022 ? ? End of Session - 01/11/22 1512   ? ? Visit Number 17   ? Number of Visits 25   ? Date for SLP Re-Evaluation 01/26/22   ? Authorization Type Medicare/ medicaid secondary   ? SLP Start Time 1100   ? SLP Stop Time  1158   ? SLP Time Calculation (min) 58 min   ? Activity Tolerance Patient tolerated treatment well   ? ?  ?  ? ?  ? ? ?Past Medical History:  ?Diagnosis Date  ? Aphasia   ? 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 CVA (cerebral vascular accident) [facility])   ? Diverticulosis   ? History of ischemic left MCA stroke   ? Hypertension   ? Pain due to onychomycosis of toenails of both feet   ? 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 Uterine prolapse   ? ? ?No past surgical history on file. ? ?There were no vitals filed for this visit. ? ? Subjective Assessment - 01/11/22 1231   ? ? Subjective "I'm doing alright"   ? Patient is accompained by: Family member   Lindsey [name] present for beginning of session  ? Currently in Pain? No/denies   ? Pain Score 0-No pain   ? ?  ?  ? ?  ? ? ? ? ? ? ? ? ADULT SLP TREATMENT - 01/11/22 1240   ? ?  ? General Information  ? Behavior/Cognition Alert;Cooperative;Pleasant mood   ? HPI Patient is a 66 y.o. female with prior history of CVA in 2021 with residual aphasia (using Lingraphica AAC device to assist with communication as of 08/2021) referred by Dr. Ranell Patrick after recent hospital admission when she was found to have new L AICA infarct complicated by pyelonephritis. Admitted to [facility] and then CIR 12/18-12/30/22. MRI 09/22/21 showed acute moderate size L AICA cerebellar infarct, underlying  extensive chronic left MCA encephalomalacia related to 2021 infarct, left brainstem Wallerian degeneration and chronic left PICA cerebellar infarcts.   ?  ? Cognitive-Linquistic Treatment  ? Treatment focused on Aphasia;Apraxia   ? Skilled Treatment Pt used device to identify family members she saw for Easter from F:3-F:6 with ~70% acc with mod cues. With min-mod gestural cues, pt navigated her device to select icons about her last therapy session with another therapist (I missed you, how was your day off?). Pt selected icons to answer simple personal questions with mod A for navigating the home and back button. Pt participated in adding icons to leisure folder to make comments during an [place] game. Demonstrated opportunities to use these icons during the game. While playing UN[employer], pt selected icons on 11 opportunities with mod A. Noted towards end of game, pt gesturing towards icons independently, but required cues to select it. Throughout therapy tasks, pt with frequent occurences of spontaneous speech.   ?  ? Assessment / Recommendations / Plan  ? Plan Continue with current plan of care   ?  ? Progression Toward Goals  ? Progression toward goals Progressing toward goals   ? ?  ?  ? ?  ? ? ? SLP Education - 01/11/22 1545   ? ?  Education Details Using device in daily activities   ? Person(s) Educated Patient   ? Methods Explanation   ? Comprehension Verbalized understanding   ? ?  ?  ? ?  ? ? ? SLP Short Term Goals - 12/13/21 0924   ? ?  ? SLP SHORT TERM GOAL #1  ? Title Pt will answer personally relevant Y/N questions in context using multimodal communication or AAC if necessary, 75% accuracy.   ? Time 10   ? Period --   sessions  ? Status Achieved   ?  ? SLP SHORT TERM GOAL #2  ? Title Given verbal or visual prompt, pt will correctly ID written word/object/icon in field of 4-6 to communicate wants/needs with 75% accuracy given occasional mod A over 2 sessions   ? Baseline F:3 12/09/21   ? Time 10   ? Period --   visits   ? Status Not Met   ?  ? SLP SHORT TERM GOAL #3  ? Title Pt will verbally approximate 3 significant words for 5/10 trials given occasional fading to rare mod A over 2 sessions   ? Time 10   ? Period --   sessions  ? Status Achieved   ? ?  ?  ? ?  ? ? ? SLP Long Term Goals - 12/13/21 0930   ? ?  ? SLP LONG TERM GOAL #1  ? Title Pt will use multimodal communication system with mod assist to express wants/needs given >2 choices over 2 sessions for 4/5 opportunities   ? Time 12   ? Period Weeks   ? Status On-going   ?  ? SLP LONG TERM GOAL #2  ? Title Caregiver will demonstrate appropriate use of supported conversation and use of multimodal supports to aid pt's comprehension of personally relevant topics, over 4 sessions.   ? Time 12   ? Period Weeks   ? Status On-going   ?  ? SLP LONG TERM GOAL #3  ? Title Caregiver will appropriately cue patient when apraxic/aphasic errors occurs for 4/5 opportunities over 2 sessions   ? Time 12   ? Period Weeks   ? Status On-going   ? ?  ?  ? ?  ? ? ? Plan - 01/11/22 1519   ? ? Clinical Impression Statement Lindsey [name] presents with severe aphasia and severe verbal apraxia, chronic deficits exacerbated by new CVA in December 2022.  Pt navigating the device with assistance and benefits from gestural cues to locate icons. Pt continues to increase productions of spontaneous speech. Continue to provide opportunities for pt to use multimodal communication. Pt would benefit from further training to increase independent use/initiation with device to communicate. Recommend skilled ST with focus on functional communication to improve ability to communicate wants/needs, reduce frustration/social isolation and improve quality of life.   ? Speech Therapy Frequency 2x / week   ? Duration 12 weeks   ? Treatment/Interventions Compensatory strategies;Patient/family education;Functional tasks;Cueing hierarchy;Environmental controls;Cognitive reorganization;Multimodal communcation approach;Language  facilitation;Compensatory techniques;Internal/external aids;SLP instruction and feedback;Other (comment)   ? Potential to [place]   ? Potential Considerations Severity of impairments;Previous level of function   ? SLP Home Exercise Plan bring Lingraphica to next session   ? Consulted and Agree with Plan of Care Patient;Family member/caregiver   ? Family Member Consulted daughter   ? ?  ?  ? ?  ? ? ?Patient will benefit from skilled therapeutic intervention in order to improve the following deficits and impairments:   ?  Aphasia ? ?Verbal apraxia ? ? ? ?Problem List ?Patient Active Problem List  ? Diagnosis Date Noted  ? Blood clotting disorder ([place]) 12/27/2021  ? Elevated AST (SGOT) 10/22/2021  ? 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 Cerebral embolism with cerebral infarction 09/23/2021  ? Pyelonephritis 09/19/2021  ? Pain due to onychomycosis of toenails of both feet 11/19/2020  ? Normocytic anemia 05/31/2020  ? Acute ischemic left MCA stroke ([place]) 04/30/2020  ? Right hemiplegia ([place]) 04/30/2020  ? Cerebrovascular accident [facility]) 04/21/2020  ? Atrial fibrillation with RVR ([place]) 04/21/2020  ? Essential hypertension 04/21/2020  ? Alcohol abuse 04/21/2020  ?Lindsey [name] SLP Student   ? ?Killeen [name], Adin ?01/11/2022, 3:48 PM ? ?[facility] ?[facility] ?[place], [place], [address] ?Phone: [phone]   Fax:  [phone] ? ? ?Name: Marki Frede ?MRN: [id number] ?Date of Birth: 1955-12-05 ? ?

## 2022-01-13 ENCOUNTER — Ambulatory Visit: Payer: Medicare Other

## 2022-01-13 ENCOUNTER — Ambulatory Visit: Payer: Medicare Other | Admitting: Occupational Therapy

## 2022-01-13 ENCOUNTER — Encounter: Payer: Self-pay | Admitting: Occupational Therapy

## 2022-01-13 ENCOUNTER — Ambulatory Visit: Payer: Medicare Other | Admitting: Speech Pathology

## 2022-01-13 DIAGNOSIS — R262 Difficulty in walking, not elsewhere classified: Secondary | ICD-10-CM

## 2022-01-13 DIAGNOSIS — M6281 Muscle weakness (generalized): Secondary | ICD-10-CM | POA: Diagnosis not present

## 2022-01-13 DIAGNOSIS — R482 Apraxia: Secondary | ICD-10-CM

## 2022-01-13 DIAGNOSIS — R269 Unspecified abnormalities of gait and mobility: Secondary | ICD-10-CM

## 2022-01-13 DIAGNOSIS — R4701 Aphasia: Secondary | ICD-10-CM

## 2022-01-13 DIAGNOSIS — R2681 Unsteadiness on feet: Secondary | ICD-10-CM

## 2022-01-13 DIAGNOSIS — I63512 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 middle cerebral artery: Secondary | ICD-10-CM

## 2022-01-13 DIAGNOSIS — R278 Other lack of coordination: Secondary | ICD-10-CM

## 2022-01-13 NOTE — Therapy (Signed)
[facility] ?[facility] ?[place], [place], [address] ?Phone: [phone]   Fax:  [phone] ? ?Physical Therapy Treatment ? ?Patient Details  ?Name: Lindsey [name] ?MRN: [id number] ?Date of Birth: November 10, 1955 ?Referring Provider (PT): Raulkar, Clide Deutscher, MD ? ? ?Encounter Date: 01/13/2022 ? ? PT End of Session - 01/13/22 0945   ? ? Visit Number 12   ? Number of Visits 24   ? Date for PT Re-Evaluation 02/08/22   ? Authorization Time Period Cert through [id number]   ? Progress Note Due on Visit 20   ? PT Start Time 0930   ? PT Stop Time 1012   ? PT Time Calculation (min) 42 min   ? Equipment Utilized During Treatment Gait belt   ? Activity Tolerance Patient limited by fatigue   ? Behavior During Therapy [facility] for tasks assessed/performed   ? ?  ?  ? ?  ? ? ?Past Medical History:  ?Diagnosis Date  ? Aphasia   ? 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 CVA (cerebral vascular accident) [facility])   ? Diverticulosis   ? History of ischemic left MCA stroke   ? Hypertension   ? Pain due to onychomycosis of toenails of both feet   ? 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 Uterine prolapse   ? ? ?History reviewed. No pertinent surgical history. ? ?There were no vitals filed for this visit. ? ? Subjective Assessment - 01/13/22 0944   ? ? Subjective Patietn denies any pain. States doing okay. Presents with new wrist brace/splint today.   ? Patient is accompained by: Family member   [name]  ? Pertinent History Pt was doing very well and was walking short distances wqith a cane prior to second stroke in december. At this point she is limited to wheelchair as her main means of mobility. Pt caregiver reports all detailed information. Pt has hesitation with the left lower extremity and significant weakness on the right lower extremity. Pt will take 2-3 steps when transfering. Pt caregiver assists with all transfers.Pt has shower with ledge  to steps over ledge when entering and utilizes bedside commode. Pt has 2 level home with chair lift for upstairs access. Pt would like to improve ransfers, improve neglect on her R side and improve her overall strength and mobility.Pt. is a 66 y.o. 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 Pt. has Right sided weakness, and receptive, and expressive aphasia. Pt. has had a previous CVA with right sided weakness in July 2021. PMHx includes: AFib, Renal Artery thrombosis, situational depression, HTN, Pyelnephritis, Seizure activity, COnstipation, AKI. Vitamin D deficiency, and urinary incontinence.   ? Limitations Standing;Walking;House hold activities   ? How long can you sit comfortably? n/a   ? How long can you stand comfortably? 2 min   ? How long can you walk comfortably? 3-4 steps   ? Patient Stated Goals Improve balance and functional movements   ? ?  ?  ? ?  ? ? ?INTERVENTIONS:  ? ? ? ? ? ?[place]:  ?  ?Seated hamstring curl with GTB 2 sets x 10 reps ?Seated  Hip march BLE  (3lb AW on left) and AAROM on right x 15 reps each ?Seated knee ext (3lb on left and no weight on right) x 15 reps each  ?Sit to stand from edge of mat  with Left UE support and right knee flexed to approx 80 deg - 2 sets of 10 reps. Patient presents with some use of back of legs to assist up but did improve to legs not touch.  ? ?Gait training - Patient ambulated approx 50 feet today with hemiwalker on left side, CGA with use of gait belt. Max VC for gait sequencing with patient exhibiting unsteadiness and posterior lean at times requiring max assist to stay standing erect using hemi walker. Gait distance limited by fatigue today.  ? ?Education provided throughout session via VC/TC and demonstration to facilitate movement at target joints and correct muscle activation for all testing and exercises performed.   ? ? ? ? ? ? ? ? ? ? ? ? ? ? ? ? ? ? ? ? PT Education -  01/13/22 0944   ? ? Education Details Exercise technique   ? Person(s) Educated Patient   ? Methods Explanation;Demonstration;Tactile cues;Verbal cues   ? Comprehension Verbalized understanding;Returned demonstration;Verbal cues required;Tactile cues required;Need further instruction   ? ?  ?  ? ?  ? ? ? PT Short Term Goals - 01/06/22 1120   ? ?  ? PT SHORT TERM GOAL #1  ? Title Patient will be independent in home exercise program to improve strength/mobility for better functional independence with ADLs.   ? Baseline no formal HEp for LE/balance at this time   ? Time 4   ? Period Weeks   ? Status New   ? Target Date 12/14/21   ? ?  ?  ? ?  ? ? ? ? PT Long Term Goals - 01/06/22 1122   ? ?  ? PT LONG TERM GOAL #1  ? Title Patient will increase FOTO score to equal to or greater than   41  to demonstrate statistically significant improvement in mobility and quality of life.   ? Baseline 16 on 2/14; 01/06/2022=   ? Time 12   ? Period Weeks   ? Status New   ? Target Date 02/08/22   ?  ? PT LONG TERM GOAL #3  ? Title Patient  will complete five times sit to stand test in < 30 seconds with UE assistance indicating an increased LE strength and improved balance.   ? Baseline 58.47 s with UE assist and with A from PT for R LE positioning; 01/06/2022=   ? Time 12   ? Period Weeks   ? Status New   ? Target Date 02/08/22   ?  ? PT LONG TERM GOAL #4  ? Title Patient will increase Berg Balance score by > 6 points to demonstrate decreased fall risk during functional activities.   ? Baseline 12 on 2/14   ? Time 12   ? Period Weeks   ? Status New   ? Target Date 02/08/22   ?  ? PT LONG TERM GOAL #5  ? Title Pt will transfer from seated position on table/chair to/from transport chair/WC in order to indicate improved transfer ability.   ? Baseline Requires Min A for LE positioning and to prevent LOB.   ? Time 12   ? Period Weeks   ? Status New   ? Target Date 02/08/22   ? ?  ?  ? ?  ? ? ? ? ? ? ? ? Plan - 01/13/22 0945   ? ? Clinical  Impression Statement Patient arrived today with good motivation for today's session. She performed much better overall with sit  to stand with less overall verbal cues for hand placement. She did fatigue quicker today with gait and more difficulty with gait sequencing today. Patient unable to continue due to increased overall fatigue. The pt will benefit from further skilled PT to improve strength, mobility and balance to increase ease and safety with transfers and ADLs   ? Personal Factors and Comorbidities Age;Comorbidity 1;Comorbidity 2;Comorbidity 3+;Time since onset of injury/illness/exacerbation;Other   ? Comorbidities AFib, Renal Artery thrombosis, situational depression, HTN, Pyelnephritis, Seizure activity, COnstipation, AKI   ? Examination-Activity Limitations Bathing;Bed Mobility;Carry;Locomotion Level;Dressing;Sit;Squat;Stairs;Stand;Toileting;Transfers   ? Examination-Participation Restrictions Church;Community Activity;Laundry   ? Stability/Clinical Decision Making Evolving/Moderate complexity   ? Rehab Potential Fair   ? PT Frequency 2x / week   ? PT Duration 12 weeks   ? PT Treatment/Interventions ADLs/Self Care Home Management;Gait training;Stair training;Therapeutic activities;Therapeutic exercise;Balance training;Neuromuscular re-education;Patient/family education;Wheelchair mobility training;Manual techniques;Energy conservation;Dry needling;Passive range of motion;Taping;Spinal Manipulations;Joint Manipulations   ? PT Next Visit Plan Gait training, Transfer training, LE strengthening as appropriate.   ? PT Home Exercise Plan HEP: general LE and postural exercises   ? Consulted and Agree with Plan of Care Patient   ? ?  ?  ? ?  ? ? ?Patient will benefit from skilled therapeutic intervention in order to improve the following deficits and impairments:  Abnormal gait, Decreased activity tolerance, Decreased endurance, Decreased knowledge of use of DME, Decreased range of motion, Decreased strength,  Hypomobility, Impaired flexibility, Difficulty walking, Decreased balance, Decreased coordination, Decreased knowledge of precautions, Decreased mobility, Decreased safety awareness ? ?Visit Diagnosis: ?Muscle

## 2022-01-13 NOTE — Therapy (Signed)
Visit arrived- no charge. Shortly after beginning session, pt appeared frustrated and disengaged. Offered choices of preferred activities however pt shook head. SLP affirmed pt's facial expressions and pt agreed that she felt sad and frustrated. Offered written choice if pt would like to continue session or go home, and pt chose "go home." Called pt's daughter to discuss; she reported pt struggling more with apraxia this morning, combing her hair instead of trying to put on her shirt, and was frustrated by this. Pt's frustration with session began after similar difficulty with apraxic movement when trying to make selections on her communication device.  ? ?Lindsey Lever, MS, CCC-SLP ?Speech-Language Pathologist ?[phone] ? ? ? ?[facility] ?[facility] ?[place], [place], [address] ?Phone: [phone]   Fax:  [phone] ? ?Patient Details  ?Name: Lindsey [name] ?MRN: [id number] ?Date of Birth: Aug 13, 1956 ?Referring Provider:  Izora Ribas, MD ? ?Encounter Date: 01/13/2022 ? ? ?Aliene Altes, CCC-SLP ?01/13/2022, 11:43 AM ? ?[facility] ?[facility] ?[place], [place], [address] ?Phone: [phone]   Fax:  [phone] ?

## 2022-01-13 NOTE — Therapy (Signed)
[place] ?[facility] ?[address] ?[place], [place], [address] ?Phone: [phone]   Fax:  [phone] ? ?Occupational Therapy Treatment ? ?Patient Details  ?Name: Guerry BruinColleen Tuminello ?MRN: [id number] ?Date of Birth: 12/18/1955 ?Referring Provider (OT): Dr. Carlis Abbottaulkar ? ? ?Encounter Date: 01/13/2022 ? ? OT End of Session - 01/13/22 1252   ? ? Visit Number 22   ? Number of Visits 48   ? Date for OT Re-Evaluation 03/31/22   ? Authorization Type Progress report period starting 12/02/2021   ? OT Start Time 1019   ? OT Stop Time 1100   ? OT Time Calculation (min) 41 min   ? Activity Tolerance Patient tolerated treatment well   ? Behavior [facility] tasks assessed/performed   ? ?  ?  ? ?  ? ? ?Past Medical History:  ?Diagnosis Date  ? Aphasia   ? 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 CVA (cerebral vascular accident) [facility](HCC)   ? Diverticulosis   ? History of ischemic left MCA stroke   ? Hypertension   ? Pain due to onychomycosis of toenails of both feet   ? 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HCC)   ? Uterine prolapse   ? ? ?History reviewed. No pertinent surgical history. ? ?There were no vitals filed for this visit. ? ? Subjective Assessment - 01/13/22 1248   ? ? Subjective  Reviewed the right shoulder harness brace application with her grandson, [name].   ? Patient is accompanied by: Family member   ? Pertinent History Pt. is a 66 y.o. 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 Pt. has Right sided weakness, and receptive, and expressive aphasia. Pt. has had a previous CVA with right sided weakness in July 2021. PMHx includes: AFib, Renal Artery thrombosis, situational depression, HTN, Pyelnephritis, Seizure activity, COnstipation, AKI. Vitamin D deficiency, and urinary incontinence/   ? Currently in Pain? Yes   ? Pain Score 9    ? Pain Location Arm   At  the end range of motion for shoulder abduction  ? Pain Orientation Right   ? Pain Descriptors / Indicators --   unable to describe  ? Multiple Pain Sites No   ? ?  ?  ? ?  ? ?OT TREATMENT   ?  ?Manual Therapy: ?  ?Pt. Tolerated soft tissue massage to the right scapular musculature. Pt. tolerated scapular mobilizations in elevation, depression, abduction, and rotation following moist heat modality to the right shoulder. Pt. Tolerated soft tissue mobilizations for radius on ulna, and metacarpal spread stretches to decrease tightness, and prepare the UE, and hand for ROM and therapeutic Ex. ?  ?Therapeutic Exercise: ?  ?Pt. tolerated PROM through all joint ranges of the RUE, and hand. Pt. performed self-ROM to the right MPs, PIPs, and DIPs.   ?  ?Pt. Has received her new SoftPro resting hand splint. Pt. presented with increased tone, and tightness throughout the RUE, and hand. Pt. Presents with 9/10 pain in the right shoulder with abduction. Pt. responded well to the session with less tone, and tightness noted proximally following ROM, and manual therapy. An adjustment was made manually to the SoftPro splint to decrease the thumb webspace stretch for a more proper fit. Pt./caregiver education was provided about the resting hand splint application. Pt. continues to work on normalizing tone, and facilitating active volitional movement in the  RUE, and hand to improve engagement of the RUE during ADLs, as well as improve transfers, and functional mobility for toileting  ?  ? ?  ? ? ? ? ? ? ? ? ? ? ? ? ? ? ? ? ? ? ? ? ? OT Education - 01/13/22 1251   ? ? Education Details UE functioning, RUE ROM   ? Person(s) Educated Patient   ? Methods Explanation;Verbal cues;Tactile cues   ? Comprehension Verbal cues required;Need further instruction;Returned demonstration;Tactile cues required   ? ?  ?  ? ?  ? ? ? OT Short Term Goals - 01/06/22 1117   ? ?  ? OT SHORT TERM GOAL #1  ? Title Pt and caregiver will be independent with HEP  for RUE   ? Baseline Eval: No current program. 10th visit: Caregivers  report being comfortable with exercises at home. 01/06/2022: Continue ongoing HEPs for UE   ? Time 6   ? Period Weeks   ? Status On-going   ? Target Date 02/17/22   ? ?  ?  ? ?  ? ? ? ? OT Long Term Goals - 01/06/22 1120   ? ?  ? OT LONG TERM GOAL #1  ? Title Pt. will engage engage the RUE as a gross assist/atabilizer during ADLs 100% of the time   ? Baseline 01/06/2022: Pt. is starting to engage her RUE as a gross stabiliizer with increased cues. Eval: pt. does not currently engage her RUE during ADLs. 10th visit: Pt. continues to need to work towards using her RUE as a gross stabilizer.   ? Time 12   ? Period Weeks   ? Status On-going   ? Target Date 03/31/22   ?  ? OT LONG TERM GOAL #2  ? Title Pt. will perform UE dressing using one armed dressing techniques   ? Baseline Eval: MaxA, 10th visit: ModA 01/06/2022: min-modA   ? Time 12   ? Period Weeks   ? Status On-going   ? Target Date 03/31/22   ?  ? OT LONG TERM GOAL #3  ? Title Pt. will require minA to stand and hike pants   ? Baseline Eval: MaxA, 10th visit: ModA 01/06/2022: ModA   ? Time 12   ? Period Weeks   ? Target Date 03/31/22   ?  ? OT LONG TERM GOAL #4  ? Title Pt. will perform LE dressing with minA   ? Baseline Eval: MaxA 10th visit: Pt. requires MaxA for donning her brace, shoes, and socks. ModA with pants. 01/06/2022: ModA pants, MaxA shoes, socks, and brace   ? Time 12   ? Period Weeks   ? Status On-going   ? Target Date 01/21/22   ?  ? OT LONG TERM GOAL #5  ? Title Pt. will improve FOTO score by 2 pt. to reflect funcational change   ? Baseline Eval: FOTO score: 12, 10th visit: FOTO score 27 01/06/2022: 27   ? Time 12   ? Period Weeks   ? Status On-going   ? Target Date 03/31/22   ?  ? OT LONG TERM GOAL #6  ? Title Patient will transfer to bedside commode or toilet with supervision.   ? Baseline 01/06/2022: min-ModA   ? Time 4   ? Period Weeks   ? Status On-going   ? Target Date  03/31/22   ? ?  ?  ? ?  ? ? ? ? ? ? ? ?  Plan - 01/13/22 1252   ? ? Clinical Impression Statement Pt. Has received her new SoftPro resting hand splint. Pt. presented with increased tone, and tightness throughout the RUE, and hand. Pt. Presents with 9/10 pain in the right shoulder with abduction. Pt. responded well to the session with less tone, and tightness noted proximally following ROM, and manual therapy. An adjustment was made manually to the SoftPro splint to decrease the thumb webspace stretch for a more proper fit. Pt./caregiver education was provided about the resting hand splint application. Pt. continues to work on normalizing tone, and facilitating active volitional movement in the RUE, and hand to improve engagement of the RUE during ADLs, as well as improve transfers, and functional mobility for toileting ?   ? OT Occupational Profile and History Detailed Assessment- Review of Records and additional review of physical, cognitive, psychosocial history related to current functional performance   ? Occupational performance deficits (Please refer to evaluation for details): Leisure;IADL's;ADL's   ? Body Structure / Function / Physical Skills ADL;IADL;FMC;ROM;UE functional use;Strength;Decreased knowledge of use of DME;Coordination   ? Rehab Potential Fair   ? Clinical Decision Making Several treatment options, min-mod task modification necessary   ? Comorbidities Affecting Occupational Performance: May have comorbidities impacting occupational performance   ? Modification or Assistance to Complete Evaluation  Min-Moderate modification of tasks or assist with assess necessary to complete eval   ? OT Frequency 2x / week   ? OT Duration 12 weeks   ? OT Treatment/Interventions Self-care/ADL training;Neuromuscular education;Electrical Stimulation;Patient/family education;Therapeutic activities;Passive range of motion;Therapist, nutritional;Therapeutic exercise;DME and/or AE instruction;Moist Heat   ?  Consulted and Agree with Plan of Care Patient   ? ?  ?  ? ?  ? ? ?Patient will benefit from skilled therapeutic intervention in order to improve the following deficits and impairments:   ?Body Structure / Function

## 2022-01-14 ENCOUNTER — Ambulatory Visit: Payer: Medicare Other | Admitting: Physical Medicine and Rehabilitation

## 2022-01-17 ENCOUNTER — Ambulatory Visit: Payer: Medicare Other

## 2022-01-17 ENCOUNTER — Ambulatory Visit: Payer: Medicare Other | Admitting: Physical Therapy

## 2022-01-17 ENCOUNTER — Ambulatory Visit: Payer: Medicare Other | Admitting: Speech Pathology

## 2022-01-19 ENCOUNTER — Encounter: Payer: Self-pay | Admitting: Physical Therapy

## 2022-01-19 ENCOUNTER — Ambulatory Visit: Payer: Medicare Other | Admitting: Speech Pathology

## 2022-01-19 ENCOUNTER — Ambulatory Visit: Payer: Medicare Other

## 2022-01-19 ENCOUNTER — Ambulatory Visit: Payer: Medicare Other | Admitting: Physical Therapy

## 2022-01-19 DIAGNOSIS — M6281 Muscle weakness (generalized): Secondary | ICD-10-CM

## 2022-01-19 DIAGNOSIS — I63542 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: Secondary | ICD-10-CM

## 2022-01-19 DIAGNOSIS — R4701 Aphasia: Secondary | ICD-10-CM

## 2022-01-19 DIAGNOSIS — R269 Unspecified abnormalities of gait and mobility: Secondary | ICD-10-CM

## 2022-01-19 DIAGNOSIS — R2681 Unsteadiness on feet: Secondary | ICD-10-CM

## 2022-01-19 DIAGNOSIS — R482 Apraxia: Secondary | ICD-10-CM

## 2022-01-19 DIAGNOSIS — R262 Difficulty in walking, not elsewhere classified: Secondary | ICD-10-CM

## 2022-01-19 NOTE — Therapy (Signed)
[place] ?[facility] ?[place], [place], [address] ?Phone: [phone]   Fax:  [phone] ? ?Speech Language Pathology Treatment ? ?Patient Details  ?Name: Lindsey [name] ?MRN: [id number] ?Date of Birth: 1956/08/21 ?Referring Provider (SLP): Dr. Ranell Patrick ? ? ?Encounter Date: 01/19/2022 ? ? End of Session - 01/19/22 1229   ? ? Visit Number 18   ? Number of Visits 25   ? Date for SLP Re-Evaluation 01/26/22   ? Authorization Type Medicare/ medicaid secondary   ? SLP Start Time 1102   ? SLP Stop Time  1200   ? SLP Time Calculation (min) 58 min   ? Activity Tolerance Patient tolerated treatment well   ? ?  ?  ? ?  ? ? ?Past Medical History:  ?Diagnosis Date  ? Aphasia   ? 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 CVA (cerebral vascular accident) [facility])   ? Diverticulosis   ? History of ischemic left MCA stroke   ? Hypertension   ? Pain due to onychomycosis of toenails of both feet   ? 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[place])   ? Uterine prolapse   ? ? ?No past surgical history on file. ? ?There were no vitals filed for this visit. ? ? Subjective Assessment - 01/19/22 1224   ? ? Subjective Reports wanting to do her best today (verbal approximation)   ? Patient is accompained by: self  ? Currently in Pain? No/denies   ? Pain Score 0-No pain   ? ?  ?  ? ?  ? ? ? ? ? ? ? ? ADULT SLP TREATMENT - 01/19/22 1230   ? ?  ? General Information  ? Behavior/Cognition Alert;Cooperative;Pleasant mood   ? HPI Patient is a 66 y.o. female with prior history of CVA in 2021 with residual aphasia (using Lingraphica AAC device to assist with communication as of 08/2021) referred by Dr. Ranell Patrick after recent hospital admission when she was found to have new L AICA infarct complicated by pyelonephritis. Admitted to [facility] and then CIR 12/18-12/30/22. MRI 09/22/21 showed acute moderate size L AICA cerebellar infarct, underlying extensive chronic left  MCA encephalomalacia related to 2021 infarct, left brainstem Wallerian degeneration and chronic left PICA cerebellar infarcts.   ?  ? Treatment Provided  ? Treatment provided Cognitive-Linquistic   ?  ? Cognitive-Linquistic Treatment  ? Treatment focused on Aphasia;Apraxia   ? Skilled Treatment Added new icon for pt to tell her daughter she walked in PT today. With min gestural cues, pt identified speech therapy folder and asked graduate clincian and clinician "how are you". Provided pt with choices of what to work on for therapy today; practicing speech, playing cards, or practicing using her speech generating device. Pt independently chose practicing speech and practicing using her device. With mod gestural cues, pt navigated folders (food, needs) to discuss likes/dislikes and wants/needs. Pt participated in recording her voice for personalization of existing icons in her needs folder (going to bed, changing her clothes, cleaning her glasses, and going to the bathroom). Targeted apraxia with common conversation phrases, pt was able to repeat phrases with ~80% accuracy, increased to 90% acc with min cues. During task, pt with occurrences of perservation. Educated pt on aphasia and apraxia and discussed taking a break when she gets stuck to reduce frustration. Showed pt how to access whiteboard section on her device. Pt able to copy her name; however perseverated motions  when attempting to write numbers and then again when trying to make icon selections (apraxic).   ?  ? Assessment / Recommendations / Plan  ? Plan Continue with current plan of care   ?  ? Progression Toward Goals  ? Progression toward goals Progressing toward goals   ? ?  ?  ? ?  ? ? ? SLP Education - 01/19/22 1228   ? ? Education Details Aphasia and Apraxia   ? Person(s) Educated Patient   ? Methods Explanation   ? Comprehension Verbalized understanding   ? ?  ?  ? ?  ? ? ? SLP Short Term Goals - 12/13/21 0924   ? ?  ? SLP SHORT TERM GOAL #1  ?  Title Pt will answer personally relevant Y/N questions in context using multimodal communication or AAC if necessary, 75% accuracy.   ? Time 10   ? Period --   sessions  ? Status Achieved   ?  ? SLP SHORT TERM GOAL #2  ? Title Given verbal or visual prompt, pt will correctly ID written word/object/icon in field of 4-6 to communicate wants/needs with 75% accuracy given occasional mod A over 2 sessions   ? Baseline F:3 12/09/21   ? Time 10   ? Period --   visits  ? Status Not Met   ?  ? SLP SHORT TERM GOAL #3  ? Title Pt will verbally approximate 3 significant words for 5/10 trials given occasional fading to rare mod A over 2 sessions   ? Time 10   ? Period --   sessions  ? Status Achieved   ? ?  ?  ? ?  ? ? ? SLP Long Term Goals - 12/13/21 0930   ? ?  ? SLP LONG TERM GOAL #1  ? Title Pt will use multimodal communication system with mod assist to express wants/needs given >2 choices over 2 sessions for 4/5 opportunities   ? Time 12   ? Period Weeks   ? Status On-going   ?  ? SLP LONG TERM GOAL #2  ? Title Caregiver will demonstrate appropriate use of supported conversation and use of multimodal supports to aid pt's comprehension of personally relevant topics, over 4 sessions.   ? Time 12   ? Period Weeks   ? Status On-going   ?  ? SLP LONG TERM GOAL #3  ? Title Caregiver will appropriately cue patient when apraxic/aphasic errors occurs for 4/5 opportunities over 2 sessions   ? Time 12   ? Period Weeks   ? Status On-going   ? ?  ?  ? ?  ? ? ? Plan - 01/19/22 1257   ? ? Clinical Impression Statement Bannie presents with severe aphasia and severe verbal apraxia, chronic deficits exacerbated by new CVA in December 2022. Pt appears highly motivated during personalization of device; smiling and laughing when hearing her voice on the device. Pt increasing spontaneous production of speech. Pt continues to benefit from cues for apraxic errors. Pt would benefit from continued training to increase locating icons to communicate  wants/needs and functional information. I continue to recommend skilled ST with focus on functional communication to improve ability to communicate wants/needs, reduce frustration/social isolation and improve quality of life.   ? Speech Therapy Frequency 2x / week   ? Duration 12 weeks   ? Treatment/Interventions Compensatory strategies;Patient/family education;Functional tasks;Cueing hierarchy;Environmental controls;Cognitive reorganization;Multimodal communcation approach;Language facilitation;Compensatory techniques;Internal/external aids;SLP instruction and feedback;Other (comment)   ? Potential  to [place]   ? Potential Considerations Severity of impairments;Previous level of function   ? SLP Home Exercise Plan bring Lingraphica to next session   ? Consulted and Agree with Plan of Care Patient;Family member/caregiver   ? Family Member Consulted daughter   ? ?  ?  ? ?  ? ? ?Patient will benefit from skilled therapeutic intervention in order to improve the following deficits and impairments:   ?Verbal apraxia ? ?Aphasia ? ? ? ?Problem List ?Patient Active Problem List  ? Diagnosis Date Noted  ? Blood clotting disorder ([place]) 12/27/2021  ? Elevated AST (SGOT) 10/22/2021  ? 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[place])   ? 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 Cerebral embolism with cerebral infarction 09/23/2021  ? Pyelonephritis 09/19/2021  ? Pain due to onychomycosis of toenails of both feet 11/19/2020  ? Normocytic anemia 05/31/2020  ? Acute ischemic left MCA stroke ([place]) 04/30/2020  ? Right hemiplegia ([place]) 04/30/2020  ? Cerebrovascular accident [facility]) 04/21/2020  ? Atrial fibrillation with RVR ([place]) 04/21/2020  ? Essential hypertension 04/21/2020  ? Alcohol abuse 04/21/2020  ? ?Caryl Comes [facility] SLP Student   ? ?[place] [facility], [place] ?01/19/2022, 1:19 PM ? ?[place] ?[facility] ?[place], [place], [address] ?Phone: [phone]   Fax:  [phone]

## 2022-01-19 NOTE — Therapy (Signed)
[place] [facility] ?[place], [place], [address] ?Phone: [phone]   Fax:  [phone] ? ?Physical Therapy Treatment ? ?Patient Details  ?Name: Lindsey [name] ?MRN: [id number] ?Date of Birth: 05-Nov-1955 ?Referring Provider (PT): Raulkar, Clide Deutscher, MD ? ? ?Encounter Date: 01/19/2022 ? ? PT End of Session - 01/19/22 1058   ? ? Visit Number 13   ? Number of Visits 24   ? Date for PT Re-Evaluation 02/08/22   ? Authorization Time Period Cert through [id number]   ? Progress Note Due on Visit 20   ? PT Start Time [phone]   ? PT Stop Time 1015   ? PT Time Calculation (min) 39 min   ? Equipment Utilized During Treatment Gait belt   ? Activity Tolerance Patient limited by fatigue   ? Behavior During Therapy [facility] for tasks assessed/performed   ? ?  ?  ? ?  ? ? ?Past Medical History:  ?Diagnosis Date  ? Aphasia   ? 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 CVA (cerebral vascular accident) [facility])   ? Diverticulosis   ? History of ischemic left MCA stroke   ? Hypertension   ? Pain due to onychomycosis of toenails of both feet   ? 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 Uterine prolapse   ? ? ?History reviewed. No pertinent surgical history. ? ?There were no vitals filed for this visit. ? ? Subjective Assessment - 01/19/22 0944   ? ? Subjective Pt reports she has been doing well. No reports of pain at this time.   ? Patient is accompained by: Family member   [name]  ? Pertinent History Pt was doing very well and was walking short distances wqith a cane prior to second stroke in december. At this point she is limited to wheelchair as her main means of mobility. Pt caregiver reports all detailed information. Pt has hesitation with the left lower extremity and significant weakness on the right lower extremity. Pt will take 2-3 steps when transfering. Pt caregiver assists with all transfers.Pt has shower with ledge to steps over  ledge when entering and utilizes bedside commode. Pt has 2 level home with chair lift for upstairs access. Pt would like to improve ransfers, improve neglect on her R side and improve her overall strength and mobility.Pt. is a 66 y.o. 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 Pt. has Right sided weakness, and receptive, and expressive aphasia. Pt. has had a previous CVA with right sided weakness in July 2021. PMHx includes: AFib, Renal Artery thrombosis, situational depression, HTN, Pyelnephritis, Seizure activity, COnstipation, AKI. Vitamin D deficiency, and urinary incontinence.   ? Limitations Standing;Walking;House hold activities   ? How long can you sit comfortably? n/a   ? How long can you stand comfortably? 2 min   ? How long can you walk comfortably? 3-4 steps   ? Patient Stated Goals Improve balance and functional movements   ? Currently in Pain? No/denies   ? ?  ?  ? ?  ? ? ?INTERVENTIONS:  ? ? ?Pt transfers wit hmod A from pt from transport chair to standard arm chair  ? ? ?[place]:  ?  ?Seated hamstring curl with GTB 2 sets x 10 reps, decreased ROM on R compared to L ?Seated  Hip march BLE  (3lb AW on left) and AAROM  on right 2x10 reps each ?-external cue of PT hand superior to knee to improve target movement ?Seated knee ext (3lb on left and no weight on right) x 15 reps each  ?Sit to stand from standard chair with airex pad in seat with Left UE support and right knee flexed to approx 80 deg - 2 sets of 8 reps. Pt requires min A for transition without airex under cushion ? ?Gait training - Patient ambulated from PT treatment area to OT treatment area today with hemiwalker on left side, CGA with use of gait belt. Mod VC for gait sequencing with patient exhibiting unsteadiness and posterior lean at times requiring max assist to stay standing erectto maintain balanceusing hemi walker. Able to transfer with cues for reaching for  chair to standard chair for OT session.  ? ?Education provided throughout session via VC/TC and demonstration to facilitate movement at target joints and correct muscle activation for all testing and exercises performed.   ? ? ? ? ? ? ? ? ? ? ? ? ? ? ? ? ? ? ? ? ? ? ? ? ? ? ? ? PT Education - 01/19/22 0944   ? ? Education Details exercise technique   ? Person(s) Educated Patient   ? Methods Explanation;Demonstration   ? Comprehension Verbalized understanding   ? ?  ?  ? ?  ? ? ? PT Short Term Goals - 01/06/22 1120   ? ?  ? PT SHORT TERM GOAL #1  ? Title Patient will be independent in home exercise program to improve strength/mobility for better functional independence with ADLs.   ? Baseline no formal HEp for LE/balance at this time   ? Time 4   ? Period Weeks   ? Status New   ? Target Date 12/14/21   ? ?  ?  ? ?  ? ? ? ? PT Long Term Goals - 01/06/22 1122   ? ?  ? PT LONG TERM GOAL #1  ? Title Patient will increase FOTO score to equal to or greater than   41  to demonstrate statistically significant improvement in mobility and quality of life.   ? Baseline 16 on 2/14; 01/06/2022=   ? Time 12   ? Period Weeks   ? Status New   ? Target Date 02/08/22   ?  ? PT LONG TERM GOAL #3  ? Title Patient  will complete five times sit to stand test in < 30 seconds with UE assistance indicating an increased LE strength and improved balance.   ? Baseline 58.47 s with UE assist and with A from PT for R LE positioning; 01/06/2022=   ? Time 12   ? Period Weeks   ? Status New   ? Target Date 02/08/22   ?  ? PT LONG TERM GOAL #4  ? Title Patient will increase Berg Balance score by > 6 points to demonstrate decreased fall risk during functional activities.   ? Baseline 12 on 2/14   ? Time 12   ? Period Weeks   ? Status New   ? Target Date 02/08/22   ?  ? PT LONG TERM GOAL #5  ? Title Pt will transfer from seated position on table/chair to/from transport chair/WC in order to indicate improved transfer ability.   ? Baseline Requires Min A for  LE positioning and to prevent LOB.   ? Time 12   ? Period Weeks   ? Status New   ?  Target Date 02/08/22   ? ?  ?  ? ?  ? ? ? ? ? ? ? ? Plan - 01/19/22 1242   ? ? Clinical Impression Statement Patient arrives with excellent motivation for completion of physical therapy program.  Patient continues to progress with sit to stand but is unable to perform without assistance from standard chair.  Patient did progress with ability to ambulate with fewer verbal cues but still requires assistance to prevent loss of balance when ambulating prolonged distances.  Patient will continue to benefit from skilled PT in order to improve strength, mobility, ambulatory capacity, balance, and improve safety with transfers and ADLs as well as improve her quality of life.   ? Personal Factors and Comorbidities Age;Comorbidity 1;Comorbidity 2;Comorbidity 3+;Time since onset of injury/illness/exacerbation;Other   ? Comorbidities AFib, Renal Artery thrombosis, situational depression, HTN, Pyelnephritis, Seizure activity, COnstipation, AKI   ? Examination-Activity Limitations Bathing;Bed Mobility;Carry;Locomotion Level;Dressing;Sit;Squat;Stairs;Stand;Toileting;Transfers   ? Examination-Participation Restrictions Church;Community Activity;Laundry   ? Stability/Clinical Decision Making Evolving/Moderate complexity   ? Rehab Potential Fair   ? PT Frequency 2x / week   ? PT Duration 12 weeks   ? PT Treatment/Interventions ADLs/Self Care Home Management;Gait training;Stair training;Therapeutic activities;Therapeutic exercise;Balance training;Neuromuscular re-education;Patient/family education;Wheelchair mobility training;Manual techniques;Energy conservation;Dry needling;Passive range of motion;Taping;Spinal Manipulations;Joint Manipulations   ? PT Next Visit Plan Gait training, Transfer training, LE strengthening as appropriate.   ? PT Home Exercise Plan HEP: general LE and postural exercises   ? Consulted and Agree with Plan of Care Patient   ? ?   ?  ? ?  ? ? ?Patient will benefit from skilled therapeutic intervention in order to improve the following deficits and impairments:  Abnormal gait, Decreased activity tolerance, Decreased endurance, Decr

## 2022-01-20 ENCOUNTER — Inpatient Hospital Stay: Payer: Medicare Other

## 2022-01-20 NOTE — Therapy (Signed)
[place] [facility] ?[place], [place], [address] ?Phone: [phone]   Fax:  [phone] ? ?Occupational Therapy Treatment ? ?Patient Details  ?Name: Lindsey [name] ?MRN: [id number] ?Date of Birth: 24-Aug-1956 ?Referring Provider (OT): Dr. Ranell Patrick ? ? ?Encounter Date: 01/19/2022 ? ? OT End of Session - 01/20/22 [id number]   ? ? Visit Number 23   ? Number of Visits 48   ? Date for OT Re-Evaluation 03/31/22   ? Authorization Type Progress report period starting 12/02/2021   ? OT Start Time 1015   ? OT Stop Time 1100   ? OT Time Calculation (min) 45 min   ? Equipment Utilized During Treatment transport chair   ? Activity Tolerance Patient tolerated treatment well   ? Behavior During Therapy [name] for tasks assessed/performed   ? ?  ?  ? ?  ? ? ?Past Medical History:  ?Diagnosis Date  ? Aphasia   ? 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 CVA (cerebral vascular accident) [facility])   ? Diverticulosis   ? History of ischemic left MCA stroke   ? Hypertension   ? Pain due to onychomycosis of toenails of both feet   ? 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 Uterine prolapse   ? ? ?No past surgical history on file. ? ?There were no vitals filed for this visit. ? ? Subjective Assessment - 01/19/22 0913   ? ? Subjective  Pt uses communication device to indicate she's feeling fine today.   ? Patient is accompanied by: Family member   ? Pertinent History Pt. is a 66 y.o. 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 Pt. has Right sided weakness, and receptive, and expressive aphasia. Pt. has had a previous CVA with right sided weakness in July 2021. PMHx includes: AFib, Renal Artery thrombosis, situational depression, HTN, Pyelnephritis, Seizure activity, COnstipation, AKI. Vitamin D deficiency, and urinary incontinence/   ? Limitations Right side weakness,  reptive, and expressive ahasia   ? Patient Stated Goals To be able to dress herself.   ? Currently in Pain? Yes   ? Pain Score --   Faces: 4/10  ? Pain Location Shoulder   ? Pain Orientation Right   ? Pain Descriptors / Indicators --   unable to describe  ? Pain Type Chronic pain   ? Pain Radiating Towards R upper arm   ? Pain Onset More than a month ago   ? Aggravating Factors  shoulder ROM   ? Pain Relieving Factors rest, positioning, gentle stretch   ? Effect of Pain on Daily Activities Pain near end range in R shoulder during UB ADLs   ? Multiple Pain Sites No   ? ?  ?  ? ?  ? ?Occupational Therapy Treatment: ?Therapeutic Exercise: ?Performed AAROM for R scapula for elevation/depression/protraction/retraction.  Performed manual scapular gliding all planes.  Performed gentle, slow R shoulder flex/abd/horiz abd/add/ER/IR, R elbow flex/ext, R wrist flex/ext, R hand digits flex/ext, and metacarpal spread stretches, working to reduce joint stiffness and flexor tone in prep for ADL training.  Performed AAROM for R shoulder horiz abd/add x10 reps. ? ?Self Care: ?Practiced using RUE as a stabilizer; pt used L hand to place easy open/twist off pill bottles into R hand and used L hand to twist cap on/off.  Pt required initial hand over hand cueing to initiate  and sequence this task d/t global aphasia.  Performed SPT from chair to transport chair with mod A, max vc for hand and foot placement. ? ?Response to Treatment: ?See Plan/clinical impression below. ? ? ? ? ? OT Education - 01/19/22 0918   ? ? Education Details UE functioning, RUE ROM   ? Person(s) Educated Patient   ? Methods Explanation;Verbal cues;Tactile cues   ? Comprehension Verbal cues required;Need further instruction;Returned demonstration;Tactile cues required   ? ?  ?  ? ?  ? ? ? OT Short Term Goals - 01/06/22 1117   ? ?  ? OT SHORT TERM GOAL #1  ? Title Pt and caregiver will be independent with HEP for RUE   ? Baseline Eval: No current program. 10th  visit: Caregivers  report being comfortable with exercises at home. 01/06/2022: Continue ongoing HEPs for UE   ? Time 6   ? Period Weeks   ? Status On-going   ? Target Date 02/17/22   ? ?  ?  ? ?  ? ? ? ? OT Long Term Goals - 01/06/22 1120   ? ?  ? OT LONG TERM GOAL #1  ? Title Pt. will engage engage the RUE as a gross assist/atabilizer during ADLs 100% of the time   ? Baseline 01/06/2022: Pt. is starting to engage her RUE as a gross stabiliizer with increased cues. Eval: pt. does not currently engage her RUE during ADLs. 10th visit: Pt. continues to need to work towards using her RUE as a gross stabilizer.   ? Time 12   ? Period Weeks   ? Status On-going   ? Target Date 03/31/22   ?  ? OT LONG TERM GOAL #2  ? Title Pt. will perform UE dressing using one armed dressing techniques   ? Baseline Eval: MaxA, 10th visit: ModA 01/06/2022: min-modA   ? Time 12   ? Period Weeks   ? Status On-going   ? Target Date 03/31/22   ?  ? OT LONG TERM GOAL #3  ? Title Pt. will require minA to stand and hike pants   ? Baseline Eval: MaxA, 10th visit: ModA 01/06/2022: ModA   ? Time 12   ? Period Weeks   ? Target Date 03/31/22   ?  ? OT LONG TERM GOAL #4  ? Title Pt. will perform LE dressing with minA   ? Baseline Eval: MaxA 10th visit: Pt. requires MaxA for donning her brace, shoes, and socks. ModA with pants. 01/06/2022: ModA pants, MaxA shoes, socks, and brace   ? Time 12   ? Period Weeks   ? Status On-going   ? Target Date 01/21/22   ?  ? OT LONG TERM GOAL #5  ? Title Pt. will improve FOTO score by 2 pt. to reflect funcational change   ? Baseline Eval: FOTO score: 12, 10th visit: FOTO score 27 01/06/2022: 27   ? Time 12   ? Period Weeks   ? Status On-going   ? Target Date 03/31/22   ?  ? OT LONG TERM GOAL #6  ? Title Patient will transfer to bedside commode or toilet with supervision.   ? Baseline 01/06/2022: min-ModA   ? Time 4   ? Period Weeks   ? Status On-going   ? Target Date 03/31/22   ? ?  ?  ? ?  ? ? ? Plan - 01/19/22 0930   ? ?  Clinical Impression Statement Pt came in with  her new Softpro resting functional hand splint donned; good fit and comfort noted.  Moist heat applied to R shoulder during completion of ROM to RUE for pain management/muscle relaxation with good tolerance.  Good tolerance to PROM/AAROM to RUE this day.  Practiced using RUE as a stabilizer; pt used L hand to place easy open/twist off pill bottles into R hand and used L hand to twist cap on/off.  Pt required initial hand over hand cueing to initiate and sequence this task d/t global aphasia.  Pt will continue to benefit from skilled OT for reducing stiffness in R shoulder joint in order to ease caregiver assist with UB ADLs, ADL training, tone reduction strategies, functional transfer training, and increasing dynamic standing balance in order to maximize indep with daily tasks and reduce caregiver burden in the home.   ? OT Occupational Profile and History Detailed Assessment- Review of Records and additional review of physical, cognitive, psychosocial history related to current functional performance   ? Occupational performance deficits (Please refer to evaluation for details): Leisure;IADL's;ADL's   ? Body Structure / Function / Physical Skills ADL;IADL;FMC;ROM;UE functional use;Strength;Decreased knowledge of use of DME;Coordination   ? Rehab Potential Fair   ? Clinical Decision Making Several treatment options, min-mod task modification necessary   ? Comorbidities Affecting Occupational Performance: May have comorbidities impacting occupational performance   ? Modification or Assistance to Complete Evaluation  Min-Moderate modification of tasks or assist with assess necessary to complete eval   ? OT Frequency 2x / week   ? OT Duration 12 weeks   ? OT Treatment/Interventions Self-care/ADL training;Neuromuscular education;Electrical Stimulation;Patient/family education;Therapeutic activities;Passive range of motion;Therapist, nutritional;Therapeutic exercise;DME  and/or AE instruction;Moist Heat   ? Consulted and Agree with Plan of Care Patient   ? ?  ?  ? ?  ? ? ?Patient will benefit from skilled therapeutic intervention in order to improve the following deficits and impa

## 2022-01-24 ENCOUNTER — Ambulatory Visit: Payer: Medicare Other

## 2022-01-24 ENCOUNTER — Ambulatory Visit: Payer: Medicare Other | Admitting: Speech Pathology

## 2022-01-24 ENCOUNTER — Ambulatory Visit: Payer: Medicare Other | Admitting: Physical Therapy

## 2022-01-24 DIAGNOSIS — R278 Other lack of coordination: Secondary | ICD-10-CM

## 2022-01-24 DIAGNOSIS — R482 Apraxia: Secondary | ICD-10-CM

## 2022-01-24 DIAGNOSIS — R4701 Aphasia: Secondary | ICD-10-CM

## 2022-01-24 DIAGNOSIS — R2681 Unsteadiness on feet: Secondary | ICD-10-CM

## 2022-01-24 DIAGNOSIS — R262 Difficulty in walking, not elsewhere classified: Secondary | ICD-10-CM

## 2022-01-24 DIAGNOSIS — M6281 Muscle weakness (generalized): Secondary | ICD-10-CM

## 2022-01-24 DIAGNOSIS — R2689 Other abnormalities of gait and mobility: Secondary | ICD-10-CM

## 2022-01-24 DIAGNOSIS — R269 Unspecified abnormalities of gait and mobility: Secondary | ICD-10-CM

## 2022-01-24 NOTE — Therapy (Signed)
[facility] ?[place], [place], [address] ?Phone: [phone]   Fax:  [phone] ? ?Speech Language Pathology Treatment ? ?Patient Details  ?Name: Lindsey [name] ?MRN: [id number] ?Date of Birth: 08/03/1956 ?Referring Provider (SLP): Dr. Ranell Patrick ? ? ?Encounter Date: 01/24/2022 ? ? End of Session - 01/24/22 1328   ? ? Visit Number 19   ? Number of Visits 25   ? Date for SLP Re-Evaluation 01/26/22   ? Authorization Type Medicare/ medicaid secondary   ? SLP Start Time 1105   ? SLP Stop Time  1158   ? SLP Time Calculation (min) 53 min   ? Activity Tolerance Patient tolerated treatment well   ? ?  ?  ? ?  ? ? ?Past Medical History:  ?Diagnosis Date  ? Aphasia   ? 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 CVA (cerebral vascular accident) [facility])   ? Diverticulosis   ? History of ischemic left MCA stroke   ? Hypertension   ? Pain due to onychomycosis of toenails of both feet   ? 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 Uterine prolapse   ? ? ?No past surgical history on file. ? ?There were no vitals filed for this visit. ? ? Subjective Assessment - 01/24/22 1305   ? ? Subjective "I'm fine" using device   ? Patient is accompained by: Family member   Yolanda Bonine Lindsey [name] present for session  ? Currently in Pain? No/denies   ? Pain Score 0-No pain   ? ?  ?  ? ?  ? ? ? ? ? ? ? ? ADULT SLP TREATMENT - 01/24/22 1306   ? ?  ? General Information  ? Behavior/Cognition Alert;Cooperative;Pleasant mood   ? HPI Patient is a 66 y.o. female with prior history of CVA in 2021 with residual aphasia (using Lingraphica AAC device to assist with communication as of 08/2021) referred by Dr. Ranell Patrick after recent hospital admission when she was found to have new L AICA infarct complicated by pyelonephritis. Admitted to [facility] and then CIR 12/18-12/30/22. MRI 09/22/21 showed acute moderate size L AICA cerebellar infarct, underlying extensive  chronic left MCA encephalomalacia related to 2021 infarct, left brainstem Wallerian degeneration and chronic left PICA cerebellar infarcts.   ?  ? Treatment Provided  ? Treatment provided Cognitive-Linquistic   ?  ? Cognitive-Linquistic Treatment  ? Treatment focused on Aphasia;Apraxia   ? Skilled Treatment With min-mod gestural cues, pt navigated device to express "I'm fine". With min cues pt asked graduate clinician and clinician "how are you," noted pt turning toward speaker and waiting for a response before selecting another icon. Provided pt with choices of what to work on today; pt selected practicing her speech. Pt navigated folders with min-mod cues and selected items from F:3 to talk about what she ate for breakfast/lunch, snacks, and her family members. With use of device and spontaneous speech, pt discussed her grandson's upcoming graduation. Pt participated in recording her voice for personalization of existing icons. During task, pt with apraxic errors when producing /ch/ in "change my clothes." Targeted apraxia with /ch/ words in practice folder on her device (ie channel, chicken,). Provided written cue of /ch/ vs /cl/.  Pt educated on apraxia and slowing down. Encouraged pt to use device when having difficulty speaking. Returned to personalizing device, pt able to produce phrases with ~70% acc with mod cues for apraxia.   ?  ?  Assessment / Recommendations / Plan  ? Plan Continue with current plan of care   ?  ? Progression Toward Goals  ? Progression toward goals Progressing toward goals   ? ?  ?  ? ?  ? ? ? SLP Education - 01/24/22 1326   ? ? Education Details Apraxia, Using AAC to communicate   ? Person(s) Educated Patient   ? Methods Explanation   ? Comprehension Verbalized understanding   ? ?  ?  ? ?  ? ? ? SLP Short Term Goals - 12/13/21 0924   ? ?  ? SLP SHORT TERM GOAL #1  ? Title Pt will answer personally relevant Y/N questions in context using multimodal communication or AAC if necessary, 75%  accuracy.   ? Time 10   ? Period --   sessions  ? Status Achieved   ?  ? SLP SHORT TERM GOAL #2  ? Title Given verbal or visual prompt, pt will correctly ID written word/object/icon in field of 4-6 to communicate wants/needs with 75% accuracy given occasional mod A over 2 sessions   ? Baseline F:3 12/09/21   ? Time 10   ? Period --   visits  ? Status Not Met   ?  ? SLP SHORT TERM GOAL #3  ? Title Pt will verbally approximate 3 significant words for 5/10 trials given occasional fading to rare mod A over 2 sessions   ? Time 10   ? Period --   sessions  ? Status Achieved   ? ?  ?  ? ?  ? ? ? SLP Long Term Goals - 12/13/21 0930   ? ?  ? SLP LONG TERM GOAL #1  ? Title Pt will use multimodal communication system with mod assist to express wants/needs given >2 choices over 2 sessions for 4/5 opportunities   ? Time 12   ? Period Weeks   ? Status On-going   ?  ? SLP LONG TERM GOAL #2  ? Title Caregiver will demonstrate appropriate use of supported conversation and use of multimodal supports to aid pt's comprehension of personally relevant topics, over 4 sessions.   ? Time 12   ? Period Weeks   ? Status On-going   ?  ? SLP LONG TERM GOAL #3  ? Title Caregiver will appropriately cue patient when apraxic/aphasic errors occurs for 4/5 opportunities over 2 sessions   ? Time 12   ? Period Weeks   ? Status On-going   ? ?  ?  ? ?  ? ? ? Plan - 01/24/22 1330   ? ? Clinical Impression Statement [name] presents with severe aphasia and severe verbal apraxia, chronic deficits exacerbated by new CVA in December 2022. Pt engaged in personalizing her device today. Noted pt using device to engage in simple conversation with graduate clinician and clinician. Pt navigating device with assistance; benefits from gestural cues to navigate the device (folders, categories, back/home button). Pt continues to benefit from cues for apraxic errors. Plan to continue providing training to increase pt's ability to locate icons to communicate functional  information and wants/needs. Continue to recommend skilled ST with focus on functional communication to improve ability to communicate wants/needs, reduce frustration/social isolation and improve quality of life.   ? Speech Therapy Frequency 2x / week   ? Duration 12 weeks   ? Treatment/Interventions Compensatory strategies;Patient/family education;Functional tasks;Cueing hierarchy;Environmental controls;Cognitive reorganization;Multimodal communcation approach;Language facilitation;Compensatory techniques;Internal/external aids;SLP instruction and feedback;Other (comment)   ? Potential to Achieve Goals  Fair   ? Potential Considerations Severity of impairments;Previous level of function   ? SLP Home Exercise Plan bring Lingraphica to next session   ? Consulted and Agree with Plan of Care Patient;Family member/caregiver   ? Family Member Consulted daughter   ? ?  ?  ? ?  ? ? ?Patient will benefit from skilled therapeutic intervention in order to improve the following deficits and impairments:   ?Aphasia ? ?Verbal apraxia ? ? ? ?Problem List ?Patient Active Problem List  ? Diagnosis Date Noted  ? Blood clotting disorder ([place]) 12/27/2021  ? Elevated AST (SGOT) 10/22/2021  ? 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 Cerebral embolism with cerebral infarction 09/23/2021  ? Pyelonephritis 09/19/2021  ? Pain due to onychomycosis of toenails of both feet 11/19/2020  ? Normocytic anemia 05/31/2020  ? Acute ischemic left MCA stroke ([place]) 04/30/2020  ? Right hemiplegia ([place]) 04/30/2020  ? Cerebrovascular accident [facility]) 04/21/2020  ? Atrial fibrillation with RVR ([place]) 04/21/2020  ? Essential hypertension 04/21/2020  ? Alcohol abuse 04/21/2020  ? ?Caryl Comes [facility] SLP Student   ? ?[place]  [facility] [place] ?01/24/2022, 1:50 PM ? ?[place] [facility] ?[place], [place], [address] ?Phone: [phone]   Fax:  [phone] ? ? ?[place]

## 2022-01-24 NOTE — Therapy (Signed)
?[facility] ?[place], [place], [address] ?Phone: [phone]   Fax:  [phone] ? ?Physical Therapy Treatment ? ?Patient Details  ?Name: Lorynn Alavi ?MRN: [id number] ?Date of Birth: 1956/06/01 ?Referring Provider (PT): Raulkar, Clide Deutscher, MD ? ? ?Encounter Date: 01/24/2022 ? ? PT End of Session - 01/24/22 1417   ? ? Visit Number 14   ? Number of Visits 24   ? Date for PT Re-Evaluation 02/08/22   ? Authorization Time Period Cert through [id number]   ? Progress Note Due on Visit 20   ? PT Start Time 1146   ? PT Stop Time [id number]   ? PT Time Calculation (min) 43 min   ? Equipment Utilized During Treatment Gait belt   ? Activity Tolerance Patient limited by fatigue   ? Behavior During Therapy [facility] for tasks assessed/performed   ? ?  ?  ? ?  ? ? ?Past Medical History:  ?Diagnosis Date  ? Aphasia   ? 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 CVA (cerebral vascular accident) [facility])   ? Diverticulosis   ? History of ischemic left MCA stroke   ? Hypertension   ? Pain due to onychomycosis of toenails of both feet   ? 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 Uterine prolapse   ? ? ?No past surgical history on file. ? ?There were no vitals filed for this visit. ? ? Subjective Assessment - 01/24/22 1415   ? ? Subjective Patient utilizes communication board from speech therapy in order to communicate she is doing well, is having no pain, and she is ready to practice some walking today.   ? Patient is accompained by: Family member   [name]  ? Pertinent History Pt was doing very well and was walking short distances wqith a cane prior to second stroke in december. At this point she is limited to wheelchair as her main means of mobility. Pt caregiver reports all detailed information. Pt has hesitation with the left lower extremity and significant weakness on the right lower extremity. Pt will take 2-3 steps when transfering.  Pt caregiver assists with all transfers.Pt has shower with ledge to steps over ledge when entering and utilizes bedside commode. Pt has 2 level home with chair lift for upstairs access. Pt would like to improve ransfers, improve neglect on her R side and improve her overall strength and mobility.Pt. is a 66 y.o. 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 Pt. has Right sided weakness, and receptive, and expressive aphasia. Pt. has had a previous CVA with right sided weakness in July 2021. PMHx includes: AFib, Renal Artery thrombosis, situational depression, HTN, Pyelnephritis, Seizure activity, COnstipation, AKI. Vitamin D deficiency, and urinary incontinence.   ? Limitations Standing;Walking;House hold activities   ? How long can you sit comfortably? n/a   ? How long can you stand comfortably? 2 min   ? How long can you walk comfortably? 3-4 steps   ? Patient Stated Goals Improve balance and functional movements   ? Currently in Pain? No/denies   ? ?  ?  ? ?  ? ? ? ?INTERVENTIONS:  ? ? ?Pt transfers wit hmod A from pt from transport chair to standard arm chair  ? ? ?[place]:  ?  ?Seated hamstring curl with GTB 2 sets x 10 reps, decreased ROM on R  compared to L ? ?Seated  Hip march BLE  (3lb AW on left) and AAROM on right 2x10 reps each ?-external cue of PT hand superior to knee to improve target movement ? ?Seated knee ext (3lb on left and no weight on right)  2*10 reps each  ?-ext cue for proper range of motion  ? ?Sit to stand from standard chair with airex pad in seat with Left UE support and right knee flexed to approx 80 deg - 1 sets of 8 reps. Pt requires min A for transition without airex under cushion ? ?Standing at [facility] walker  ? ?R hip abduction to target ( 3-4 inches from target) x 5 reps (seated rest between and STS transition)  ?-R hip flexion to target x 10 reps, difficulty with returning LE to starting position ( hip extension)   ? ?Gait training and transfer training - Patient ambulated from PT treatment area to OT treatment area today with hemiwalker on left side, CGA with use of gait belt. Able to transfer with cues for reaching for chair to standard chair for OT session.  ?Ambulates approximately 60 feet with difficulty with turning provided multiple opportunities for turning as well as transfer at end of ambulatory bout.  1 loss of balance noted corrected via min assist from PT for steadying.  Cues throughout for large steps as well as sequencing with hemiwalker ? ?Education provided throughout session via VC/TC and demonstration to facilitate movement at target joints and correct muscle activation for all testing and exercises performed.   ? ? ? ? ? ? ? ? ? ? ? ? ? ? ? ? ? ? ? ? ? ? ? ? ? ? ? ? ? ? PT Education - 01/24/22 1416   ? ? Education Details walking technique   ? Person(s) Educated Patient   ? Methods Explanation   ? Comprehension Verbalized understanding   ? ?  ?  ? ?  ? ? ? PT Short Term Goals - 01/06/22 1120   ? ?  ? PT SHORT TERM GOAL #1  ? Title Patient will be independent in home exercise program to improve strength/mobility for better functional independence with ADLs.   ? Baseline no formal HEp for LE/balance at this time   ? Time 4   ? Period Weeks   ? Status New   ? Target Date 12/14/21   ? ?  ?  ? ?  ? ? ? ? PT Long Term Goals - 01/06/22 1122   ? ?  ? PT LONG TERM GOAL #1  ? Title Patient will increase FOTO score to equal to or greater than   41  to demonstrate statistically significant improvement in mobility and quality of life.   ? Baseline 16 on 2/14; 01/06/2022=   ? Time 12   ? Period Weeks   ? Status New   ? Target Date 02/08/22   ?  ? PT LONG TERM GOAL #3  ? Title Patient  will complete five times sit to stand test in < 30 seconds with UE assistance indicating an increased LE strength and improved balance.   ? Baseline 58.47 s with UE assist and with A from PT for R LE positioning; 01/06/2022=   ? Time 12   ?  Period Weeks   ? Status New   ? Target Date 02/08/22   ?  ? PT LONG TERM GOAL #4  ? Title Patient will increase Berg Balance score by > 6  points to demonstrate decreased fall risk during functional activities.   ? Baseline 12 on 2/14   ? Time 12   ? Period Weeks   ? Status New   ? Target Date 02/08/22   ?  ? PT LONG TERM GOAL #5  ? Title Pt will transfer from seated position on table/chair to/from transport chair/WC in order to indicate improved transfer ability.   ? Baseline Requires Min A for LE positioning and to prevent LOB.   ? Time 12   ? Period Weeks   ? Status New   ? Target Date 02/08/22   ? ?  ?  ? ?  ? ? ? ? ? ? ? ? Plan - 01/24/22 1417   ? ? Clinical Impression Statement Patient is pleasant and arrives to physical therapy session with excellent motivation for completion of activities.  Patient continues to progress with sit to stand activities this session completed with increased efficacy as evidenced by less frequent cues from physical therapist for proper upper extremity positioning for assistance.  Patient also practiced with ambulation task with hemiwalker was able to demonstrate improved tolerance for this activity with decreased incidence of loss of balance compared to previous sessions.  Patient still has difficulty taking large steps and did have some fatigue noted during end of ambulatory bout.  Patient will continue to benefit from skilled physical therapy intervention in order to improve her lower extremity strength, ambulatory capacity, balance, transfer ability, to improve her overall quality of life.   ? Personal Factors and Comorbidities Age;Comorbidity 1;Comorbidity 2;Comorbidity 3+;Time since onset of injury/illness/exacerbation;Other   ? Comorbidities AFib, Renal Artery thrombosis, situational depression, HTN, Pyelnephritis, Seizure activity, COnstipation, AKI   ? Examination-Activity Limitations Bathing;Bed Mobility;Carry;Locomotion  Level;Dressing;Sit;Squat;Stairs;Stand;Toileting;Transfers   ? Examination-Participation Restrictions Church;Community Activity;Laundry   ? Stability/Clinical Decision Making Evolving/Moderate complexity   ? Rehab Potential Fair   ? PT Frequency 2x / we

## 2022-01-25 NOTE — Therapy (Signed)
[facility] ?[place], [place], [address] ?Phone: [phone]   Fax:  [phone] ? ?Occupational Therapy Treatment ? ?Patient Details  ?Name: Lindsey [name] ?MRN: [id number] ?Date of Birth: May 08, 1956 ?Referring Provider (OT): Dr. Ranell Patrick ? ? ?Encounter Date: 01/24/2022 ? ? OT End of Session - 01/25/22 0731   ? ? Visit Number 24   ? Number of Visits 48   ? Date for OT Re-Evaluation 03/31/22   ? Authorization Type Progress report period starting 12/02/2021   ? OT Start Time 1100   ? OT Stop Time 1145   ? OT Time Calculation (min) 45 min   ? Equipment Utilized During Treatment transport chair   ? Activity Tolerance Patient tolerated treatment well   ? Behavior During Therapy [facility] for tasks assessed/performed   ? ?  ?  ? ?  ? ? ?Past Medical History:  ?Diagnosis Date  ? Aphasia   ? 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 CVA (cerebral vascular accident) [facility])   ? Diverticulosis   ? History of ischemic left MCA stroke   ? Hypertension   ? Pain due to onychomycosis of toenails of both feet   ? 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 Uterine prolapse   ? ? ?No past surgical history on file. ? ?There were no vitals filed for this visit. ? ? Subjective Assessment - 01/24/22 0730   ? ? Subjective  Pt present today with grandson.   ? Patient is accompanied by: Family member   ? Pertinent History Pt. is a 66 y.o. 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 Pt. has Right sided weakness, and receptive, and expressive aphasia. Pt. has had a previous CVA with right sided weakness in July 2021. PMHx includes: AFib, Renal Artery thrombosis, situational depression, HTN, Pyelnephritis, Seizure activity, COnstipation, AKI. Vitamin D deficiency, and urinary incontinence/   ? Limitations Right side weakness, reptive, and expressive ahasia   ? Patient  Stated Goals To be able to dress herself.   ? Currently in Pain? No/denies   ? Pain Score 0-No pain   ? Pain Onset More than a month ago   ? ?  ?  ? ?  ? ?Occupational Therapy Treatment: ?Therapeutic Exercise: ?Performed manual R scapular gliding all planes.  Performed gentle, slow R shoulder flex/abd/horiz abd/add/ER/IR, R elbow flex/ext, R wrist flex/ext, R hand digits flex/ext, working to reduce joint stiffness and flexor tone throughout RUE for better tolerance to ADLs and mobility.  Performed grip squeezes for R hand x7 reps, alternating squeezes and passive digit and wrist extension for tone reduction after each rep. ? ?Neuro re-ed: ?In supine, performed bilat 1# dowel exercises for chest press, shoulder flex 0-90, and horiz abd/add within pain free range; R hand secured to dowel with ACE wrap and therapist providing manual assist to RUE for control.  Pt was cued throughout each exercise to maintain vision of RUE and to visualize RUE helping the LUE; dowel used to achieve mirroring.  OT assisted with slow rocking near end ranges for reducing tone throughout RUE.  ? ?Response to Treatment: ?Good tolerance to therapeutic exercises and neuro re-ed for RUE.  Supine dowel exercises with mod A for supporting RUE, but effective for mirroring and tone reduction throughout RUE.  Pt will continue to benefit from skilled OT for reducing stiffness in R  shoulder joint in order to ease caregiver assist with UB ADLs, ADL training, tone reduction strategies, functional transfer training, and increasing dynamic standing balance in order to maximize indep with daily tasks and reduce caregiver burden in the home.  ? ? ? OT Education - 01/24/22 0730   ? ? Education Details UE functioning, RUE ROM   ? Person(s) Educated Patient   ? Methods Explanation;Verbal cues;Tactile cues   ? Comprehension Verbal cues required;Need further instruction;Returned demonstration;Tactile cues required   ? ?  ?  ? ?  ? ? ? OT Short Term Goals -  01/06/22 1117   ? ?  ? OT SHORT TERM GOAL #1  ? Title Pt and caregiver will be independent with HEP for RUE   ? Baseline Eval: No current program. 10th visit: Caregivers  report being comfortable with exercises at home. 01/06/2022: Continue ongoing HEPs for UE   ? Time 6   ? Period Weeks   ? Status On-going   ? Target Date 02/17/22   ? ?  ?  ? ?  ? ? ? ? OT Long Term Goals - 01/06/22 1120   ? ?  ? OT LONG TERM GOAL #1  ? Title Pt. will engage engage the RUE as a gross assist/atabilizer during ADLs 100% of the time   ? Baseline 01/06/2022: Pt. is starting to engage her RUE as a gross stabiliizer with increased cues. Eval: pt. does not currently engage her RUE during ADLs. 10th visit: Pt. continues to need to work towards using her RUE as a gross stabilizer.   ? Time 12   ? Period Weeks   ? Status On-going   ? Target Date 03/31/22   ?  ? OT LONG TERM GOAL #2  ? Title Pt. will perform UE dressing using one armed dressing techniques   ? Baseline Eval: MaxA, 10th visit: ModA 01/06/2022: min-modA   ? Time 12   ? Period Weeks   ? Status On-going   ? Target Date 03/31/22   ?  ? OT LONG TERM GOAL #3  ? Title Pt. will require minA to stand and hike pants   ? Baseline Eval: MaxA, 10th visit: ModA 01/06/2022: ModA   ? Time 12   ? Period Weeks   ? Target Date 03/31/22   ?  ? OT LONG TERM GOAL #4  ? Title Pt. will perform LE dressing with minA   ? Baseline Eval: MaxA 10th visit: Pt. requires MaxA for donning her brace, shoes, and socks. ModA with pants. 01/06/2022: ModA pants, MaxA shoes, socks, and brace   ? Time 12   ? Period Weeks   ? Status On-going   ? Target Date 01/21/22   ?  ? OT LONG TERM GOAL #5  ? Title Pt. will improve FOTO score by 2 pt. to reflect funcational change   ? Baseline Eval: FOTO score: 12, 10th visit: FOTO score 27 01/06/2022: 27   ? Time 12   ? Period Weeks   ? Status On-going   ? Target Date 03/31/22   ?  ? OT LONG TERM GOAL #6  ? Title Patient will transfer to bedside commode or toilet with supervision.   ?  Baseline 01/06/2022: min-ModA   ? Time 4   ? Period Weeks   ? Status On-going   ? Target Date 03/31/22   ? ?  ?  ? ?  ? ? Plan - 01/24/22 0743   ? ? Clinical Impression Statement  Good tolerance to therapeutic exercises and neuro re-ed for RUE.  Supine dowel exercises with mod A for supporting RUE, but effective for mirroring and tone reduction throughout RUE.  Pt will continue to benefit from skilled OT for reducing stiffness in R shoulder joint in order to ease caregiver assist with UB ADLs, ADL training, tone reduction strategies, functional transfer training, and increasing dynamic standing balance in order to maximize indep with daily tasks and reduce caregiver burden in the home.   ? OT Occupational Profile and History Detailed Assessment- Review of Records and additional review of physical, cognitive, psychosocial history related to current functional performance   ? Occupational performance deficits (Please refer to evaluation for details): Leisure;IADL's;ADL's   ? Body Structure / Function / Physical Skills ADL;IADL;FMC;ROM;UE functional use;Strength;Decreased knowledge of use of DME;Coordination   ? Rehab Potential Fair   ? Clinical Decision Making Several treatment options, min-mod task modification necessary   ? Comorbidities Affecting Occupational Performance: May have comorbidities impacting occupational performance   ? Modification or Assistance to Complete Evaluation  Min-Moderate modification of tasks or assist with assess necessary to complete eval   ? OT Frequency 2x / week   ? OT Duration 12 weeks   ? OT Treatment/Interventions Self-care/ADL training;Neuromuscular education;Electrical Stimulation;Patient/family education;Therapeutic activities;Passive range of motion;Therapist, nutritional;Therapeutic exercise;DME and/or AE instruction;Moist Heat   ? Consulted and Agree with Plan of Care Patient   ? ?  ?  ? ?  ? ? ?Patient will benefit from skilled therapeutic intervention in order to improve  the following deficits and impairments:   ?Body Structure / Function / Physical Skills: ADL, IADL, FMC, ROM, UE functional use, Strength, Decreased knowledge of use of DME, Coordination ?  ?  ? ? ?Visit

## 2022-01-26 ENCOUNTER — Ambulatory Visit: Payer: Medicare Other | Admitting: Speech Pathology

## 2022-01-26 ENCOUNTER — Encounter: Payer: Self-pay | Admitting: Physical Therapy

## 2022-01-26 ENCOUNTER — Ambulatory Visit: Payer: Medicare Other | Admitting: Occupational Therapy

## 2022-01-26 ENCOUNTER — Ambulatory Visit: Payer: Medicare Other | Admitting: Physical Therapy

## 2022-01-26 ENCOUNTER — Ambulatory Visit: Payer: Medicare Other

## 2022-01-26 DIAGNOSIS — R482 Apraxia: Secondary | ICD-10-CM

## 2022-01-26 DIAGNOSIS — M6281 Muscle weakness (generalized): Secondary | ICD-10-CM | POA: Diagnosis not present

## 2022-01-26 DIAGNOSIS — R269 Unspecified abnormalities of gait and mobility: Secondary | ICD-10-CM

## 2022-01-26 DIAGNOSIS — R262 Difficulty in walking, not elsewhere classified: Secondary | ICD-10-CM

## 2022-01-26 DIAGNOSIS — R2689 Other abnormalities of gait and mobility: Secondary | ICD-10-CM

## 2022-01-26 DIAGNOSIS — R2681 Unsteadiness on feet: Secondary | ICD-10-CM

## 2022-01-26 DIAGNOSIS — I63542 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: Secondary | ICD-10-CM

## 2022-01-26 DIAGNOSIS — R278 Other lack of coordination: Secondary | ICD-10-CM

## 2022-01-26 DIAGNOSIS — R4701 Aphasia: Secondary | ICD-10-CM

## 2022-01-26 NOTE — Therapy (Signed)
[place] [facility] ?[place], [place], [address] ?Phone: [phone]   Fax:  [phone] ? ?Occupational Therapy Treatment ? ?Patient Details  ?Name: Lindsey [name] ?MRN: [id number] ?Date of Birth: 03-21-1956 ?Referring Provider (OT): Dr. Ranell Patrick ? ? ?Encounter Date: 01/26/2022 ? ? OT End of Session - 01/26/22 1706   ? ? Visit Number 25   ? Number of Visits 48   ? Date for OT Re-Evaluation 03/31/22   ? Authorization Type Progress report period starting 12/02/2021   ? OT Start Time 1015   ? OT Stop Time 1100   ? OT Time Calculation (min) 45 min   ? Equipment Utilized During Treatment transport chair   ? Activity Tolerance Patient tolerated treatment well   ? Behavior During Therapy [facility] for tasks assessed/performed   ? ?  ?  ? ?  ? ? ?Past Medical History:  ?Diagnosis Date  ? Aphasia   ? 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 CVA (cerebral vascular accident) [facility])   ? Diverticulosis   ? History of ischemic left MCA stroke   ? Hypertension   ? Pain due to onychomycosis of toenails of both feet   ? 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 Uterine prolapse   ? ? ?No past surgical history on file. ? ?There were no vitals filed for this visit. ? ? Subjective Assessment - 01/26/22 1705   ? ? Subjective  Pt reports she's feeling fine today.   ? Patient is accompanied by: Family member   ? Pertinent History Pt. is a 66 y.o. 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 Pt. has Right sided weakness, and receptive, and expressive aphasia. Pt. has had a previous CVA with right sided weakness in July 2021. PMHx includes: AFib, Renal Artery thrombosis, situational depression, HTN, Pyelnephritis, Seizure activity, COnstipation, AKI. Vitamin D deficiency, and urinary incontinence/   ? Limitations Right side weakness, reptive, and expressive ahasia   ?  Patient Stated Goals To be able to dress herself.   ? Currently in Pain? No/denies   ? Pain Score 0-No pain   ? Pain Onset More than a month ago   ? ?  ?  ? ?  ? ?Occupational Therapy Treatment: ?Therapeutic Exercise: ?Performed passive R shoulder retraction/protraction and shoulder flex, elbow flex/ext, wrist pron/sup, and wrist flex/ext in prep for UBE.  Pt completed UBE x2.5 min forward, 2.5 min reverse rotation, working to facilitate reciprocal motion and tone reduction throughout RUE.  Used ACE wrap to secure R hand to pedal and OT provided support to elbow and wrist throughout rotations forward and backward.  Performed sit to stand from lowered mat table x4 trials with min guard-min A, cues for L hand placement and forward lean during ascent.  ? ?Neuro re-ed: ?In supine, performed bilat 1# dowel exercises for chest press, shoulder flex 0-90, and horiz abd/add within pain free range; R hand secured to dowel with ACE wrap and therapist providing manual assist to RUE for control.  Pt was cued throughout each exercise to maintain vision of RUE and to visualize RUE helping the LUE; dowel used to achieve mirroring.  Practiced place and hold with BUEs on dowel in 90 degrees shoulder flex in supine; mod A to maintain positioning.  Performed WB through R hand during sit to stand trials noted above.  OT providing  hand over hand assist for placement and follow through during push for ascent.  Practiced WB through R hand in standing holding to back of transport chair; performed gentle rocking to facilitate WS and WB throughout RUE/RLE in standing.   ? ?Response to Treatment: ?Good tone reduction throughout RUE with WB activities, UBE, and supine BUE dowel exercises.  Pt will continue to benefit from skilled OT for reducing stiffness in R shoulder joint in order to ease caregiver assist with UB ADLs, ADL training, tone reduction strategies, functional transfer training, and increasing dynamic standing balance in order to  maximize indep with daily tasks and reduce caregiver burden in the home.  ? ? ? OT Education - 01/26/22 1706   ? ? Education Details sit to stand transfer technique, weight shifting in standing, and WB through RUE during sit to stand from mat table   ? Person(s) Educated Patient   ? Methods Explanation;Verbal cues;Tactile cues;Demonstration   ? Comprehension Verbal cues required;Need further instruction;Returned demonstration;Tactile cues required;Verbalized understanding   ? ?  ?  ? ?  ? ? ? OT Short Term Goals - 01/06/22 1117   ? ?  ? OT SHORT TERM GOAL #1  ? Title Pt and caregiver will be independent with HEP for RUE   ? Baseline Eval: No current program. 10th visit: Caregivers  report being comfortable with exercises at home. 01/06/2022: Continue ongoing HEPs for UE   ? Time 6   ? Period Weeks   ? Status On-going   ? Target Date 02/17/22   ? ?  ?  ? ?  ? ? ? ? OT Long Term Goals - 01/06/22 1120   ? ?  ? OT LONG TERM GOAL #1  ? Title Pt. will engage engage the RUE as a gross assist/atabilizer during ADLs 100% of the time   ? Baseline 01/06/2022: Pt. is starting to engage her RUE as a gross stabiliizer with increased cues. Eval: pt. does not currently engage her RUE during ADLs. 10th visit: Pt. continues to need to work towards using her RUE as a gross stabilizer.   ? Time 12   ? Period Weeks   ? Status On-going   ? Target Date 03/31/22   ?  ? OT LONG TERM GOAL #2  ? Title Pt. will perform UE dressing using one armed dressing techniques   ? Baseline Eval: MaxA, 10th visit: ModA 01/06/2022: min-modA   ? Time 12   ? Period Weeks   ? Status On-going   ? Target Date 03/31/22   ?  ? OT LONG TERM GOAL #3  ? Title Pt. will require minA to stand and hike pants   ? Baseline Eval: MaxA, 10th visit: ModA 01/06/2022: ModA   ? Time 12   ? Period Weeks   ? Target Date 03/31/22   ?  ? OT LONG TERM GOAL #4  ? Title Pt. will perform LE dressing with minA   ? Baseline Eval: MaxA 10th visit: Pt. requires MaxA for donning her brace,  shoes, and socks. ModA with pants. 01/06/2022: ModA pants, MaxA shoes, socks, and brace   ? Time 12   ? Period Weeks   ? Status On-going   ? Target Date 01/21/22   ?  ? OT LONG TERM GOAL #5  ? Title Pt. will improve FOTO score by 2 pt. to reflect funcational change   ? Baseline Eval: FOTO score: 12, 10th visit: FOTO score 27 01/06/2022: 27   ? Time  12   ? Period Weeks   ? Status On-going   ? Target Date 03/31/22   ?  ? OT LONG TERM GOAL #6  ? Title Patient will transfer to bedside commode or toilet with supervision.   ? Baseline 01/06/2022: min-ModA   ? Time 4   ? Period Weeks   ? Status On-going   ? Target Date 03/31/22   ? ?  ?  ? ?  ? ? ? ? Plan - 01/26/22 1724   ? ? Clinical Impression Statement Good tone reduction throughout RUE with WB activities, UBE, and supine BUE dowel exercises.  Pt will continue to benefit from skilled OT for reducing stiffness in R shoulder joint in order to ease caregiver assist with UB ADLs, ADL training, tone reduction strategies, functional transfer training, and increasing dynamic standing balance in order to maximize indep with daily tasks and reduce caregiver burden in the home.   ? OT Occupational Profile and History Detailed Assessment- Review of Records and additional review of physical, cognitive, psychosocial history related to current functional performance   ? Occupational performance deficits (Please refer to evaluation for details): Leisure;IADL's;ADL's   ? Body Structure / Function / Physical Skills ADL;IADL;FMC;ROM;UE functional use;Strength;Decreased knowledge of use of DME;Coordination   ? Rehab Potential Fair   ? Clinical Decision Making Several treatment options, min-mod task modification necessary   ? Comorbidities Affecting Occupational Performance: May have comorbidities impacting occupational performance   ? Modification or Assistance to Complete Evaluation  Min-Moderate modification of tasks or assist with assess necessary to complete eval   ? OT Frequency 2x /  week   ? OT Duration 12 weeks   ? OT Treatment/Interventions Self-care/ADL training;Neuromuscular education;Electrical Stimulation;Patient/family education;Therapeutic activities;Passive range of motion;Funct

## 2022-01-26 NOTE — Therapy (Signed)
[facility] ?[place], [place], [address] ?Phone: [phone]   Fax:  [phone] ? ?Speech Language Pathology Treatment, Progress Note and Recertification ? ?Patient Details  ?Name: Lindsey [name] ?MRN: [id number] ?Date of Birth: March 07, 1956 ?Referring Provider (SLP): Dr. Ranell Patrick ? ?Speech Therapy Progress Note ? ?Dates of Reporting Period: 12/09/2021 to 01/26/2022 ? ?Objective: Patient has been seen for 10 speech therapy sessions this reporting period targeting apraxia, aphasia, and AAC. Patient is making progress toward LTGs; met 1/3 active LTGs this reporting period. Goals updated and new goal added to increase caregiver skills in modifying pt's communication device to meet her changing expressive and receptive needs. Pt demonstrates improvement with ability to use her communication device, but is anticipated to continue to require caregiver support for this given severity of ideomotor apraxia. Will decrease frequency to 1x week, anticipate ~6 weeks sufficient to meet remaining LTGs. Pt's daughter in agreement with plan (discussed over the phone as pt alone for session). Requested family member present for remaining sessions as caregiver training now primary focus of remaining goals. Recertification completed today. See skilled intervention, clinical impressions, and goals below for details. ? ? ? ?Encounter Date: 01/26/2022 ? ? End of Session - 01/26/22 1330   ? ? Visit Number 20   ? Number of Visits 25   ? Date for SLP Re-Evaluation 03/10/22   ? Authorization Type Medicare/ medicaid secondary   ? SLP Start Time 1103   ? SLP Stop Time  1150   ? SLP Time Calculation (min) 47 min   ? Activity Tolerance Patient tolerated treatment well   ? ?  ?  ? ?  ? ? ?Past Medical History:  ?Diagnosis Date  ? Aphasia   ? 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 CVA (cerebral vascular accident) [facility])   ?  Diverticulosis   ? History of ischemic left MCA stroke   ? Hypertension   ? Pain due to onychomycosis of toenails of both feet   ? 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 Uterine prolapse   ? ? ?No past surgical history on file. ? ?There were no vitals filed for this visit. ? ? Subjective Assessment - 01/26/22 1248   ? ? Subjective "I'm good"   ? Currently in Pain? No/denies   ? Pain Score 0-No pain   ? ?  ?  ? ?  ? ? ? ? ? ? ? ? ADULT SLP TREATMENT - 01/26/22 1251   ? ?  ? General Information  ? Behavior/Cognition Alert;Cooperative;Pleasant mood   ? HPI Patient is a 66 y.o. female with prior history of CVA in 2021 with residual aphasia (using Lingraphica AAC device to assist with communication as of 08/2021) referred by Dr. Ranell Patrick after recent hospital admission when she was found to have new L AICA infarct complicated by pyelonephritis. Admitted to [facility] and then CIR 12/18-12/30/22. MRI 09/22/21 showed acute moderate size L AICA cerebellar infarct, underlying extensive chronic left MCA encephalomalacia related to 2021 infarct, left brainstem Wallerian degeneration and chronic left PICA cerebellar infarcts.   ?  ? Treatment Provided  ? Treatment provided Cognitive-Linquistic   ?  ? Cognitive-Linquistic Treatment  ? Treatment focused on Aphasia;Apraxia   ? Skilled Treatment With use of calendar, discussed pt's updated therapy schedule. With min assist, pt navigated device to select her "food" folder to discuss what she ate for breakfast. Pt participated in customizing  device to add more breakfast options to food folder. Providing demonstration of using device to make food preferences (ie "I don't want that", "I want something else"). When discussing what she ate for lunch, pt chose usual icon "cheeseburger", with spontaneous speech she expressed "one more time." With min-mod gestural cues, pt navigated device to select family folder. Pt chose family members to talk about from F:3-F:6. Using the device and spontaneous  speech, pt discussed where she grew up and which family members still lived in [place]. Pt participated in adding icon "I'm from [place], [place]" to Psychologist, counselling.   ?  ? Assessment / Recommendations / Plan  ? Plan Continue with current plan of care   ? ?  ?  ? ?  ? ? ? SLP Education - 01/26/22 1327   ? ? Education Details Using device to express wants/needs   ? Person(s) Educated Patient   ? Methods Explanation   ? Comprehension Verbalized understanding   ? ?  ?  ? ?  ? ? ? SLP Short Term Goals - 12/13/21 0924   ? ?  ? SLP SHORT TERM GOAL #1  ? Title Pt will answer personally relevant Y/N questions in context using multimodal communication or AAC if necessary, 75% accuracy.   ? Time 10   ? Period --   sessions  ? Status Achieved   ?  ? SLP SHORT TERM GOAL #2  ? Title Given verbal or visual prompt, pt will correctly ID written word/object/icon in field of 4-6 to communicate wants/needs with 75% accuracy given occasional mod A over 2 sessions   ? Baseline F:3 12/09/21   ? Time 10   ? Period --   visits  ? Status Not Met   ?  ? SLP SHORT TERM GOAL #3  ? Title Pt will verbally approximate 3 significant words for 5/10 trials given occasional fading to rare mod A over 2 sessions   ? Time 10   ? Period --   sessions  ? Status Achieved   ? ?  ?  ? ?  ? ? ? SLP Long Term Goals - 01/27/22 0836   ? ?  ? SLP LONG TERM GOAL #1  ? Title Pt will use multimodal communication system with mod assist to express wants/needs given >2 choices over 2 sessions for 4/5 opportunities   ? Time 12   ? Period Weeks   ? Status Achieved   ?  ? SLP LONG TERM GOAL #2  ? Title Caregiver will demonstrate appropriate use of supported conversation and multimodal supports to aid pt's comprehension and expression of personally relevant topics, over 4 sessions.   ? Time 6   ? Period Weeks   ? Status Revised   ? Target Date 03/10/22   ?  ? SLP LONG TERM GOAL #3  ? Title Caregiver will appropriately cue patient when apraxic/aphasic errors occurs for 4/5  opportunities over 2 sessions   ? Time 6   ? Period Weeks   ? Status On-going   ? Target Date 03/10/22   ?  ? SLP LONG TERM GOAL #4  ? Title Caregiver will demonstrate ability to modify Lingraphica device with modified independence over 3 sessions.   ? Time 6   ? Period Weeks   ? Status New   ? Target Date 03/10/22   ? ?  ?  ? ?  ? ? ? Plan - 01/26/22 1335   ? ? Clinical Impression Statement  Chanelle presents with severe aphasia and severe verbal apraxia, chronic deficits exacerbated by new CVA in December 2022. Pt engaged in personalizing her device again  today. Pt continues to increase usuage of spontaneous speech; noted pt using spontaneous speech for simple responses throughout session today. Pt increasing navigation of device; less cueing required today.Continue to recommend skilled ST with focus on functional communication to improve ability to communicate wants/needs, reduce frustration/social isolation and improve quality of life.   ? Speech Therapy Frequency 2x / week   ? Duration 12 weeks   ? Treatment/Interventions Compensatory strategies;Patient/family education;Functional tasks;Cueing hierarchy;Environmental controls;Cognitive reorganization;Multimodal communcation approach;Language facilitation;Compensatory techniques;Internal/external aids;SLP instruction and feedback;Other (comment)   ? Potential to [place]   ? Potential Considerations Severity of impairments;Previous level of function   ? SLP Home Exercise Plan bring Lingraphica to next session   ? Consulted and Agree with Plan of Care Patient;Family member/caregiver   ? Family Member Consulted daughter   ? ?  ?  ? ?  ? ? ?Patient will benefit from skilled therapeutic intervention in order to improve the following deficits and impairments:   ?Verbal apraxia ? ?Aphasia ? ? ? ?Problem List ?Patient Active Problem List  ? Diagnosis Date Noted  ? Blood clotting disorder ([place]) 12/27/2021  ? Elevated AST (SGOT) 10/22/2021  ? Lupus anticoagulant  positive 10/22/2021  ? 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 Cerebral infarction due to unspecified occlusion or stenos

## 2022-01-26 NOTE — Therapy (Signed)
[place] ?[place], [place], [address] ?Phone: [phone]   Fax:  [phone] ? ?Physical Therapy Treatment ? ?Patient Details  ?Name: Lindsey [name] ?MRN: [id number] ?Date of Birth: 1955/11/19 ?Referring Provider (PT): Raulkar, Clide Deutscher, MD ? ? ?Encounter Date: 01/26/2022 ? ? PT End of Session - 01/26/22 1202   ? ? Visit Number 15   ? Number of Visits 24   ? Date for PT Re-Evaluation 02/08/22   ? Authorization Time Period Cert through [id number]   ? Progress Note Due on Visit 20   ? PT Start Time 1150   ? PT Stop Time 1230   ? PT Time Calculation (min) 40 min   ? Equipment Utilized During Treatment Gait belt   ? Activity Tolerance Patient limited by fatigue   ? Behavior During Therapy [facility] for tasks assessed/performed   ? ?  ?  ? ?  ? ? ?Past Medical History:  ?Diagnosis Date  ? Aphasia   ? 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 CVA (cerebral vascular accident) [facility])   ? Diverticulosis   ? History of ischemic left MCA stroke   ? Hypertension   ? Pain due to onychomycosis of toenails of both feet   ? 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 Uterine prolapse   ? ? ?History reviewed. No pertinent surgical history. ? ?There were no vitals filed for this visit. ? ? Subjective Assessment - 01/26/22 1201   ? ? Subjective Patient utilizes communication board from speech therapy in order to communicate she is doing well, is having no pain.   ? Patient is accompained by: Family member   [name]  ? Pertinent History Pt was doing very well and was walking short distances wqith a cane prior to second stroke in december. At this point she is limited to wheelchair as her main means of mobility. Pt caregiver reports all detailed information. Pt has hesitation with the left lower extremity and significant weakness on the right lower extremity. Pt will take 2-3 steps when transfering. Pt caregiver assists with all  transfers.Pt has shower with ledge to steps over ledge when entering and utilizes bedside commode. Pt has 2 level home with chair lift for upstairs access. Pt would like to improve ransfers, improve neglect on her R side and improve her overall strength and mobility.Pt. is a 66 y.o. 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 Pt. has Right sided weakness, and receptive, and expressive aphasia. Pt. has had a previous CVA with right sided weakness in July 2021. PMHx includes: AFib, Renal Artery thrombosis, situational depression, HTN, Pyelnephritis, Seizure activity, COnstipation, AKI. Vitamin D deficiency, and urinary incontinence.   ? Limitations Standing;Walking;House hold activities   ? How long can you sit comfortably? n/a   ? How long can you stand comfortably? 2 min   ? How long can you walk comfortably? 3-4 steps   ? Patient Stated Goals Improve balance and functional movements   ? Currently in Pain? No/denies   ? ?  ?  ? ?  ? ? ? ? ?Export:  ?  ?Patient transferred wheelchair to Nustep with hemi walker, Min A for safety; ?PT positioned patient on Nustep with LUE arm attachement (#8) seat #8, with RLE leg attachment to prevent hip abduction/ER for better positioning, level 2 x4 min, pt frustrated that RLE  foot did not stay flat on machine but rather often pushed down into PF due to tone/AFO; ?Reports increased fatigue after exercise; ? ?Seated hamstring curl with GTB 2 sets x 10 reps, decreased ROM on R compared to L ?Required increased time on RLE to isolate hamstring activation and improve erect posture; ?  ?Seated  Hip march BLE  (3lb AW on left) and AAROM on right x15 reps each ?-external cue of PT hand superior to knee to improve target movement ?Also required increased time to isolate hip flexion on RLE and avoid co-contraction of quad on RLE; ?  ?   ?Gait training and transfer training -  ?Following Nustep, Pt ambulated from  Nustep to chair approximately 25 feet with hemiwalker in LUE, min-mod A from therapy with step to gait pattern and increased lateral/posterior loss of balance requiring assistance to avoid fall;  ? ?After exercise,  ?Patient ambulated approximately 75 feet with hemiwalker on left side, CGA-min A with use of gait belt. Able to transfer with cues for reaching for chair for safety; ?  Cues throughout for large steps as well as sequencing with hemiwalker ?  ?Education provided throughout session via VC/TC and demonstration to facilitate movement at target joints and correct muscle activation for all testing and exercises performed.   ?  ?Pt reports increased fatigue at end of session;  ? ? ? ? ? ? ? ? ? ? ? ? ? ? ? ? ? ? ? ? ? ? ? PT Education - 01/26/22 1201   ? ? Education Details exercise technique/positioning;   ? Person(s) Educated Patient   ? Methods Explanation;Verbal cues   ? Comprehension Verbalized understanding;Returned demonstration;Verbal cues required;Need further instruction   ? ?  ?  ? ?  ? ? ? PT Short Term Goals - 01/06/22 1120   ? ?  ? PT SHORT TERM GOAL #1  ? Title Patient will be independent in home exercise program to improve strength/mobility for better functional independence with ADLs.   ? Baseline no formal HEp for LE/balance at this time   ? Time 4   ? Period Weeks   ? Status New   ? Target Date 12/14/21   ? ?  ?  ? ?  ? ? ? ? PT Long Term Goals - 01/06/22 1122   ? ?  ? PT LONG TERM GOAL #1  ? Title Patient will increase FOTO score to equal to or greater than   41  to demonstrate statistically significant improvement in mobility and quality of life.   ? Baseline 16 on 2/14; 01/06/2022=   ? Time 12   ? Period Weeks   ? Status New   ? Target Date 02/08/22   ?  ? PT LONG TERM GOAL #3  ? Title Patient  will complete five times sit to stand test in < 30 seconds with UE assistance indicating an increased LE strength and improved balance.   ? Baseline 58.47 s with UE assist and with A from PT for R LE  positioning; 01/06/2022=   ? Time 12   ? Period Weeks   ? Status New   ? Target Date 02/08/22   ?  ? PT LONG TERM GOAL #4  ? Title Patient will increase Berg Balance score by > 6 points to demonstrate decreased fall risk during functional activities.   ? Baseline 12 on 2/14   ? Time 12   ? Period Weeks   ? Status New   ?  Target Date 02/08/22   ?  ? PT LONG TERM GOAL #5  ? Title Pt will transfer from seated position on table/chair to/from transport chair/WC in order to indicate improved transfer ability.   ? Baseline Requires Min A for LE positioning and to prevent LOB.   ? Time 12   ? Period Weeks   ? Status New   ? Target Date 02/08/22   ? ?  ?  ? ?  ? ? ? ? ? ? ? ? Plan - 01/26/22 1254   ? ? Clinical Impression Statement Patient motivated and participated well within session. She was a few minutes late as Speech therapy ran over. Patient utilized Equities trader to communicate during session as needed. PT initiated exercise on Nustep utilizing leg attachment to help prevent hip abduction/ER. Patient reports increased fatigue  but denies any increase in pain. She ambulated to chair short distance with hemiwalker, requiring min-mod A with significant unsteadiness. Patient instructed in LE ROM/strengthening exercise. She does require AAROM and increased time to isolate correct exercise technique; Following exercise, she was able to exhibit better reciprocal gait pattern and improved weight shift/positioning. Patient does require increased time and min A for safety. She reports increased fatigue at end of session. She would benefit from additional skilled PT intervention to improve strength, balance and mobility;   ? Personal Factors and Comorbidities Age;Comorbidity 1;Comorbidity 2;Comorbidity 3+;Time since onset of injury/illness/exacerbation;Other   ? Comorbidities AFib, Renal Artery thrombosis, situational depression, HTN, Pyelnephritis, Seizure activity, COnstipation, AKI   ? Examination-Activity Limitations  Bathing;Bed Mobility;Carry;Locomotion Level;Dressing;Sit;Squat;Stairs;Stand;Toileting;Transfers   ? Examination-Participation Restrictions Church;Community Activity;Laundry   ? Stability/Clinical Decision Making [employer]

## 2022-01-27 ENCOUNTER — Ambulatory Visit: Payer: Medicare Other | Admitting: Oncology

## 2022-01-28 ENCOUNTER — Inpatient Hospital Stay: Payer: Medicare Other | Attending: Oncology

## 2022-01-28 DIAGNOSIS — R760 Raised antibody titer: Secondary | ICD-10-CM

## 2022-01-28 DIAGNOSIS — Z86718 Personal history of other venous thrombosis and embolism: Secondary | ICD-10-CM | POA: Diagnosis present

## 2022-01-28 DIAGNOSIS — I6389 Other cerebral infarction: Secondary | ICD-10-CM

## 2022-01-28 DIAGNOSIS — D649 Anemia, unspecified: Secondary | ICD-10-CM

## 2022-01-28 DIAGNOSIS — N280 Ischemia and infarction of kidney: Secondary | ICD-10-CM | POA: Insufficient documentation

## 2022-01-28 LAB — CBC WITH DIFFERENTIAL/PLATELET
Abs Immature Granulocytes: 0.01 10*3/uL (ref 0.00–0.07)
Basophils Absolute: 0 10*3/uL (ref 0.0–0.1)
Basophils Relative: 0 %
Eosinophils Absolute: 0.1 10*3/uL (ref 0.0–0.5)
Eosinophils Relative: 3 %
HCT: 33.1 % — ABNORMAL LOW (ref 36.0–46.0)
Hemoglobin: 10.7 g/dL — ABNORMAL LOW (ref 12.0–15.0)
Immature Granulocytes: 0 %
Lymphocytes Relative: 39 %
Lymphs Abs: 1.4 10*3/uL (ref 0.7–4.0)
MCH: 27.8 pg (ref 26.0–34.0)
MCHC: 32.3 g/dL (ref 30.0–36.0)
MCV: 86 fL (ref 80.0–100.0)
Monocytes Absolute: 0.3 10*3/uL (ref 0.1–1.0)
Monocytes Relative: 8 %
Neutro Abs: 1.8 10*3/uL (ref 1.7–7.7)
Neutrophils Relative %: 50 %
Platelets: 235 10*3/uL (ref 150–400)
RBC: 3.85 MIL/uL — ABNORMAL LOW (ref 3.87–5.11)
RDW: 14.9 % (ref 11.5–15.5)
WBC: 3.6 10*3/uL — ABNORMAL LOW (ref 4.0–10.5)
nRBC: 0 % (ref 0.0–0.2)

## 2022-01-28 LAB — COMPREHENSIVE METABOLIC PANEL
ALT: 22 U/L (ref 0–44)
AST: 35 U/L (ref 15–41)
Albumin: 3.8 g/dL (ref 3.5–5.0)
Alkaline Phosphatase: 79 U/L (ref 38–126)
Anion gap: 8 (ref 5–15)
BUN: 12 mg/dL (ref 8–23)
CO2: 26 mmol/L (ref 22–32)
Calcium: 9.3 mg/dL (ref 8.9–10.3)
Chloride: 103 mmol/L (ref 98–111)
Creatinine, Ser: 0.92 mg/dL (ref 0.44–1.00)
GFR, Estimated: >60 mL/min (ref >60)
Glucose, Bld: 105 mg/dL — ABNORMAL HIGH (ref 70–99)
Potassium: 3.9 mmol/L (ref 3.5–5.1)
Sodium: 137 mmol/L (ref 135–145)
Total Bilirubin: 1 mg/dL (ref 0.3–1.2)
Total Protein: 8.6 g/dL — ABNORMAL HIGH (ref 6.5–8.1)

## 2022-02-01 ENCOUNTER — Ambulatory Visit: Payer: Medicare Other | Admitting: Occupational Therapy

## 2022-02-01 ENCOUNTER — Ambulatory Visit: Payer: Medicare Other | Admitting: Speech Pathology

## 2022-02-01 ENCOUNTER — Encounter: Payer: Self-pay | Admitting: Occupational Therapy

## 2022-02-01 ENCOUNTER — Ambulatory Visit: Payer: Medicare Other | Attending: Physical Medicine and Rehabilitation

## 2022-02-01 DIAGNOSIS — I63512 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 middle cerebral artery: Secondary | ICD-10-CM | POA: Insufficient documentation

## 2022-02-01 DIAGNOSIS — R278 Other lack of coordination: Secondary | ICD-10-CM | POA: Insufficient documentation

## 2022-02-01 DIAGNOSIS — R2689 Other abnormalities of gait and mobility: Secondary | ICD-10-CM | POA: Diagnosis present

## 2022-02-01 DIAGNOSIS — R4701 Aphasia: Secondary | ICD-10-CM | POA: Diagnosis present

## 2022-02-01 DIAGNOSIS — I63542 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: Secondary | ICD-10-CM | POA: Insufficient documentation

## 2022-02-01 DIAGNOSIS — M6281 Muscle weakness (generalized): Secondary | ICD-10-CM

## 2022-02-01 DIAGNOSIS — R262 Difficulty in walking, not elsewhere classified: Secondary | ICD-10-CM | POA: Insufficient documentation

## 2022-02-01 DIAGNOSIS — R2681 Unsteadiness on feet: Secondary | ICD-10-CM | POA: Diagnosis present

## 2022-02-01 DIAGNOSIS — R269 Unspecified abnormalities of gait and mobility: Secondary | ICD-10-CM | POA: Diagnosis not present

## 2022-02-01 DIAGNOSIS — R482 Apraxia: Secondary | ICD-10-CM | POA: Insufficient documentation

## 2022-02-01 NOTE — Therapy (Signed)
[facility] ?[place], [place], [address] ?Phone: [phone]   Fax:  [phone] ? ?Occupational Therapy Treatment ? ?Patient Details  ?Name: Lindsey [name] ?MRN: [id number] ?Date of Birth: Jul 07, 1956 ?Referring Provider (OT): Dr. Ranell Patrick ? ? ?Encounter Date: 02/01/2022 ? ? OT End of Session - 02/01/22 1250   ? ? Visit Number 26   ? Number of Visits 48   ? Date for OT Re-Evaluation 03/31/22   ? Authorization Type Progress report period starting 12/02/2021   ? OT Start Time 1015   ? OT Stop Time 1100   ? OT Time Calculation (min) 45 min   ? Equipment Utilized During Treatment transport chair   ? Activity Tolerance Patient tolerated treatment well   ? Behavior During Therapy [facility] for tasks assessed/performed   ? ?  ?  ? ?  ? ? ?Past Medical History:  ?Diagnosis Date  ? Aphasia   ? 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 CVA (cerebral vascular accident) [facility])   ? Diverticulosis   ? History of ischemic left MCA stroke   ? Hypertension   ? Pain due to onychomycosis of toenails of both feet   ? 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 Uterine prolapse   ? ? ?History reviewed. No pertinent surgical history. ? ?There were no vitals filed for this visit. ? ? Subjective Assessment - 02/01/22 1250   ? ? Subjective  Pt reports she's feeling fine today.   ? Patient is accompanied by: Family member   ? Pertinent History Pt. is a 66 y.o. 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 Pt. has Right sided weakness, and receptive, and expressive aphasia. Pt. has had a previous CVA with right sided weakness in July 2021. PMHx includes: AFib, Renal Artery thrombosis, situational depression, HTN, Pyelnephritis, Seizure activity, COnstipation, AKI. Vitamin D deficiency, and urinary incontinence/   ? Currently in Pain? No/denies   ? ?  ?  ? ?  ? ?OT  TREATMENT   ?  ?Manual Therapy: ?  ?Pt. Tolerated soft tissue massage to the right scapular musculature. Pt. tolerated scapular mobilizations in elevation, depression, abduction, and rotation following moist heat modality to the right shoulder. Pt. Tolerated soft tissue mobilizations for radius on ulna, and metacarpal spread stretches to decrease tightness, and prepare the UE, and hand for ROM and therapeutic Ex. Pt. Worked on BUE strengthening, and reciprocal motion using the UBE while seated for 8 min. with no resistance. Constant monitoring was provided, along with support, and an ACE wrap in place to the right hand. ?  ?Therapeutic Exercise: ?  ?Pt. tolerated PROM through all joint ranges of the RUE, and hand. Pt. performed self-ROM to the right MPs, PIPs, and DIPs.   ?  ?Pt. presents with flexor tone, and tightness. Pt. responded well to the session with less tone, and tightness noted proximally following ROM, and manual therapy. Pt. continues to work on normalizing tone, and facilitating active volitional movement in the RUE, and hand to improve engagement of the RUE during ADLs, as well as improve transfers, and functional mobility for toileting  ?  ? ? ? ? ? ? ? ? ? ? ? ? ? ? ? ? ? ? ? ? ? OT Education - 02/01/22 1250   ? ? Education Details RUE functioning   ? Person(s) Educated  Patient   ? Methods Explanation;Verbal cues;Tactile cues;Demonstration   ? Comprehension Verbal cues required;Need further instruction;Returned demonstration;Tactile cues required;Verbalized understanding   ? ?  ?  ? ?  ? ? ? OT Short Term Goals - 01/06/22 1117   ? ?  ? OT SHORT TERM GOAL #1  ? Title Pt and caregiver will be independent with HEP for RUE   ? Baseline Eval: No current program. 10th visit: Caregivers  report being comfortable with exercises at home. 01/06/2022: Continue ongoing HEPs for UE   ? Time 6   ? Period Weeks   ? Status On-going   ? Target Date 02/17/22   ? ?  ?  ? ?  ? ? ? ? OT Long Term Goals - 01/06/22 1120    ? ?  ? OT LONG TERM GOAL #1  ? Title Pt. will engage engage the RUE as a gross assist/atabilizer during ADLs 100% of the time   ? Baseline 01/06/2022: Pt. is starting to engage her RUE as a gross stabiliizer with increased cues. Eval: pt. does not currently engage her RUE during ADLs. 10th visit: Pt. continues to need to work towards using her RUE as a gross stabilizer.   ? Time 12   ? Period Weeks   ? Status On-going   ? Target Date 03/31/22   ?  ? OT LONG TERM GOAL #2  ? Title Pt. will perform UE dressing using one armed dressing techniques   ? Baseline Eval: MaxA, 10th visit: ModA 01/06/2022: min-modA   ? Time 12   ? Period Weeks   ? Status On-going   ? Target Date 03/31/22   ?  ? OT LONG TERM GOAL #3  ? Title Pt. will require minA to stand and hike pants   ? Baseline Eval: MaxA, 10th visit: ModA 01/06/2022: ModA   ? Time 12   ? Period Weeks   ? Target Date 03/31/22   ?  ? OT LONG TERM GOAL #4  ? Title Pt. will perform LE dressing with minA   ? Baseline Eval: MaxA 10th visit: Pt. requires MaxA for donning her brace, shoes, and socks. ModA with pants. 01/06/2022: ModA pants, MaxA shoes, socks, and brace   ? Time 12   ? Period Weeks   ? Status On-going   ? Target Date 01/21/22   ?  ? OT LONG TERM GOAL #5  ? Title Pt. will improve FOTO score by 2 pt. to reflect funcational change   ? Baseline Eval: FOTO score: 12, 10th visit: FOTO score 27 01/06/2022: 27   ? Time 12   ? Period Weeks   ? Status On-going   ? Target Date 03/31/22   ?  ? OT LONG TERM GOAL #6  ? Title Patient will transfer to bedside commode or toilet with supervision.   ? Baseline 01/06/2022: min-ModA   ? Time 4   ? Period Weeks   ? Status On-going   ? Target Date 03/31/22   ? ?  ?  ? ?  ? ? ? ? ? ? ? ? Plan - 02/01/22 1251   ? ? Clinical Impression Statement Pt. presents with flexor tone, and tightness. Pt. responded well to the session with less tone, and tightness noted proximally following ROM, and manual therapy. Pt. continues to work on normalizing  tone, and facilitating active volitional movement in the RUE, and hand to improve engagement of the RUE during ADLs, as well as improve transfers, and  functional mobility for toileting. ?   ? OT Occupational Profile and History Detailed Assessment- Review of Records and additional review of physical, cognitive, psychosocial history related to current functional performance   ? Occupational performance deficits (Please refer to evaluation for details): Leisure;IADL's;ADL's   ? Body Structure / Function / Physical Skills ADL;IADL;FMC;ROM;UE functional use;Strength;Decreased knowledge of use of DME;Coordination   ? Rehab Potential Fair   ? Clinical Decision Making Several treatment options, min-mod task modification necessary   ? Comorbidities Affecting Occupational Performance: May have comorbidities impacting occupational performance   ? Modification or Assistance to Complete Evaluation  Min-Moderate modification of tasks or assist with assess necessary to complete eval   ? OT Frequency 2x / week   ? OT Duration 12 weeks   ? OT Treatment/Interventions Self-care/ADL training;Neuromuscular education;Electrical Stimulation;Patient/family education;Therapeutic activities;Passive range of motion;Therapist, nutritional;Therapeutic exercise;DME and/or AE instruction;Moist Heat   ? Consulted and Agree with Plan of Care Patient   ? ?  ?  ? ?  ? ? ?Patient will benefit from skilled therapeutic intervention in order to improve the following deficits and impairments:   ?Body Structure / Function / Physical Skills: ADL, IADL, FMC, ROM, UE functional use, Strength, Decreased knowledge of use of DME, Coordination ?  ?  ? ? ?Visit Diagnosis: ?Muscle weakness (generalized) ? ? ? ?Problem List ?Patient Active Problem List  ? Diagnosis Date Noted  ? Blood clotting disorder ([place]) 12/27/2021  ? Elevated AST (SGOT) 10/22/2021  ? 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 Cerebral embolism with cerebral inf

## 2022-02-01 NOTE — Therapy (Signed)
[place] ?[facility] ?[place], [place], [address] ?Phone: [phone]   Fax:  [phone] ? ?Physical Therapy Treatment ? ?Patient Details  ?Name: Lindsey [name] ?MRN: [id number] ?Date of Birth: 10-25-1955 ?Referring Provider (PT): Raulkar, Clide Deutscher, MD ? ? ?Encounter Date: 02/01/2022 ? ? PT End of Session - 02/01/22 [id number]   ? ? Visit Number 16   ? Number of Visits 24   ? Date for PT Re-Evaluation 02/08/22   ? Authorization Time Period Cert through [id number]   ? Progress Note Due on Visit 20   ? PT Start Time [phone]   ? PT Stop Time 1013   ? PT Time Calculation (min) 45 min   ? Equipment Utilized During Treatment Gait belt   ? Activity Tolerance Patient limited by fatigue   ? Behavior During Therapy [facility] for tasks assessed/performed   ? ?  ?  ? ?  ? ? ?Past Medical History:  ?Diagnosis Date  ? Aphasia   ? 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 CVA (cerebral vascular accident) [facility])   ? Diverticulosis   ? History of ischemic left MCA stroke   ? Hypertension   ? Pain due to onychomycosis of toenails of both feet   ? 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 Uterine prolapse   ? ? ?History reviewed. No pertinent surgical history. ? ?There were no vitals filed for this visit. ? ? Subjective Assessment - 02/01/22 0927   ? ? Subjective Patient reports doing well without complaints   ? Patient is accompained by: Family member   [name]  ? Pertinent History Pt was doing very well and was walking short distances wqith a cane prior to second stroke in december. At this point she is limited to wheelchair as her main means of mobility. Pt caregiver reports all detailed information. Pt has hesitation with the left lower extremity and significant weakness on the right lower extremity. Pt will take 2-3 steps when transfering. Pt caregiver assists with all transfers.Pt has shower with ledge to steps over ledge when entering and  utilizes bedside commode. Pt has 2 level home with chair lift for upstairs access. Pt would like to improve ransfers, improve neglect on her R side and improve her overall strength and mobility.Pt. is a 66 y.o. 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 Pt. has Right sided weakness, and receptive, and expressive aphasia. Pt. has had a previous CVA with right sided weakness in July 2021. PMHx includes: AFib, Renal Artery thrombosis, situational depression, HTN, Pyelnephritis, Seizure activity, COnstipation, AKI. Vitamin D deficiency, and urinary incontinence.   ? Limitations Standing;Walking;House hold activities   ? How long can you sit comfortably? n/a   ? How long can you stand comfortably? 2 min   ? How long can you walk comfortably? 3-4 steps   ? Patient Stated Goals Improve balance and functional movements   ? Currently in Pain? No/denies   ? ?  ?  ? ?  ? ? ?INTERVENTIONS: ? ?Therapeutic Exercises:  ? ? ? ?Seated LE strengthening: ?- Assisted seated march on right/AROM on Left x 15 reps  ?- Assisted seated knee ext on right/AROM on left x 15 reps ?- Heel slide (pillowcase under Right heel) x 15 reps - AROM ? ?Standing LE strengthening:  ? ?- Standing hip march BLE x 12 reps ?-  Standing hip ABD BLE x 12 reps ?-Sit to stand with mod Assist x 10 reps using 1 UE support ?Education provided throughout session via VC/TC and demonstration to facilitate movement at target joints and correct muscle activation for all testing and exercises performed.  ? ?Gait training:  ? ?Patient ambulated approx 75 feet using hemiwalker on left side, CGA with max VC for gait sequencing- Step to gait with minimal step length and difficulty sequencing. Patient limited by decreased functional endurance.  ? ? ? ?  ? ? ? ? ? ? ? ? ? ? ? ? ? ? ? ? ? ? ? ? PT Education - 02/01/22 0928   ? ? Education Details Gait ed; exercise technique   ? Person(s) Educated Patient    ? Methods Explanation;Demonstration;Tactile cues;Verbal cues   ? Comprehension Verbalized understanding;Returned demonstration;Verbal cues required;Tactile cues required;Need further instruction   ? ?  ?  ? ?  ? ? ? PT Short Term Goals - 01/06/22 1120   ? ?  ? PT SHORT TERM GOAL #1  ? Title Patient will be independent in home exercise program to improve strength/mobility for better functional independence with ADLs.   ? Baseline no formal HEp for LE/balance at this time   ? Time 4   ? Period Weeks   ? Status New   ? Target Date 12/14/21   ? ?  ?  ? ?  ? ? ? ? PT Long Term Goals - 01/06/22 1122   ? ?  ? PT LONG TERM GOAL #1  ? Title Patient will increase FOTO score to equal to or greater than   41  to demonstrate statistically significant improvement in mobility and quality of life.   ? Baseline 16 on 2/14; 01/06/2022=   ? Time 12   ? Period Weeks   ? Status New   ? Target Date 02/08/22   ?  ? PT LONG TERM GOAL #3  ? Title Patient  will complete five times sit to stand test in < 30 seconds with UE assistance indicating an increased LE strength and improved balance.   ? Baseline 58.47 s with UE assist and with A from PT for R LE positioning; 01/06/2022=   ? Time 12   ? Period Weeks   ? Status New   ? Target Date 02/08/22   ?  ? PT LONG TERM GOAL #4  ? Title Patient will increase Berg Balance score by > 6 points to demonstrate decreased fall risk during functional activities.   ? Baseline 12 on 2/14   ? Time 12   ? Period Weeks   ? Status New   ? Target Date 02/08/22   ?  ? PT LONG TERM GOAL #5  ? Title Pt will transfer from seated position on table/chair to/from transport chair/WC in order to indicate improved transfer ability.   ? Baseline Requires Min A for LE positioning and to prevent LOB.   ? Time 12   ? Period Weeks   ? Status New   ? Target Date 02/08/22   ? ?  ?  ? ?  ? ? ? ? ? ? ? ? Plan - 02/01/22 0928   ? ? Clinical Impression Statement Patient continues to be well motivated and participated well with therex  and gait training. She was able to complete combo of seated and standing therex exhibiting increased right LE fatigue and weakness requiring some physical assist. She was able to perform transfers  and walking but continues to fatigue quickly.  She would benefit from additional skilled PT intervention to improve strength, balance and mobility.   ? Personal Factors and Comorbidities Age;Comorbidity 1;Comorbidity 2;Comorbidity 3+;Time since onset of injury/illness/exacerbation;Other   ? Comorbidities AFib, Renal Artery thrombosis, situational depression, HTN, Pyelnephritis, Seizure activity, COnstipation, AKI   ? Examination-Activity Limitations Bathing;Bed Mobility;Carry;Locomotion Level;Dressing;Sit;Squat;Stairs;Stand;Toileting;Transfers   ? Examination-Participation Restrictions Church;Community Activity;Laundry   ? Stability/Clinical Decision Making Evolving/Moderate complexity   ? Rehab Potential Fair   ? PT Frequency 2x / week   ? PT Duration 12 weeks   ? PT Treatment/Interventions ADLs/Self Care Home Management;Gait training;Stair training;Therapeutic activities;Therapeutic exercise;Balance training;Neuromuscular re-education;Patient/family education;Wheelchair mobility training;Manual techniques;Energy conservation;Dry needling;Passive range of motion;Taping;Spinal Manipulations;Joint Manipulations   ? PT Next Visit Plan Gait training, Transfer training, LE strengthening as appropriate.   ? PT Home Exercise Plan HEP: general LE and postural exercises   ? Consulted and Agree with Plan of Care Patient   ? ?  ?  ? ?  ? ? ?Patient will benefit from skilled therapeutic intervention in order to improve the following deficits and impairments:  Abnormal gait, Decreased activity tolerance, Decreased endurance, Decreased knowledge of use of DME, Decreased range of motion, Decreased strength, Hypomobility, Impaired flexibility, Difficulty walking, Decreased balance, Decreased coordination, Decreased knowledge of  precautions, Decreased mobility, Decreased safety awareness ? ?Visit Diagnosis: ?Abnormality of gait and mobility ? ?Unsteadiness on feet ? ?Other abnormalities of gait and mobility ? ?Difficulty in walking, not el

## 2022-02-01 NOTE — Therapy (Signed)
[facility] ?[facility] ?[place], [place], [address] ?Phone: [phone]   Fax:  [phone] ? ?Speech Language Pathology Treatment ? ?Patient Details  ?Name: Lindsey [name] ?MRN: [id number] ?Date of Birth: 1956-06-15 ?Referring Provider (SLP): Dr. Ranell Patrick ? ? ?Encounter Date: 02/01/2022 ? ? End of Session - 02/01/22 1338   ? ? Visit Number 21   ? Number of Visits 25   ? Date for SLP Re-Evaluation 03/10/22   ? Authorization Type Medicare/ medicaid secondary   ? SLP Start Time 1100   ? SLP Stop Time  1200   ? SLP Time Calculation (min) 60 min   ? Activity Tolerance Patient tolerated treatment well   ? ?  ?  ? ?  ? ? ?Past Medical History:  ?Diagnosis Date  ? Aphasia   ? 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 CVA (cerebral vascular accident) [facility])   ? Diverticulosis   ? History of ischemic left MCA stroke   ? Hypertension   ? Pain due to onychomycosis of toenails of both feet   ? 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 Uterine prolapse   ? ? ?No past surgical history on file. ? ?There were no vitals filed for this visit. ? ? Subjective Assessment - 02/01/22 1327   ? ? Subjective Pt arrives with daughter today   ? Currently in Pain? No/denies   ? ?  ?  ? ?  ? ? ? ? ? ? ? ? ADULT SLP TREATMENT - 02/01/22 1327   ? ?  ? General Information  ? Behavior/Cognition Alert;Cooperative;Pleasant mood   ? HPI Patient is a 66 y.o. female with prior history of CVA in 2021 with residual aphasia (using Lingraphica AAC device to assist with communication as of 08/2021) referred by Dr. Ranell Patrick after recent hospital admission when she was found to have new L AICA infarct complicated by pyelonephritis. Admitted to [facility] and then CIR 12/18-12/30/22. MRI 09/22/21 showed acute moderate size L AICA cerebellar infarct, underlying extensive chronic left MCA encephalomalacia related to 2021 infarct, left brainstem Wallerian degeneration and  chronic left PICA cerebellar infarcts.   ?  ? Cognitive-Linquistic Treatment  ? Treatment focused on Aphasia;Apraxia   ? Skilled Treatment Pt selected "I'm happy" when asked how she was feeling today (device already on "feelings" page). Daughter present and participating in session; provided training in device functionality including adding new icons, editing icons (change photo, add voice recording), search feature and moving icons to different locations. Able to return demonstration for each function. Daughter to add additional icons at home and made notes of several icons to change before next session. Demonstrated appropriate cuing for verbal apraxia with phrase level tasks; recorded pt productions of simple requests (I need my hand brace, open the front door); pt completed 90% accuracy with min-mod cues.   ?  ? Assessment / Recommendations / Plan  ? Plan Continue with current plan of care   ?  ? Progression Toward Goals  ? Progression toward goals Progressing toward goals   ? ?  ?  ? ?  ? ? ? ? ? SLP Short Term Goals - 12/13/21 0924   ? ?  ? SLP SHORT TERM GOAL #1  ? Title Pt will answer personally relevant Y/N questions in context using multimodal communication or AAC if necessary, 75% accuracy.   ? Time 10   ? Period --  sessions  ? Status Achieved   ?  ? SLP SHORT TERM GOAL #2  ? Title Given verbal or visual prompt, pt will correctly ID written word/object/icon in field of 4-6 to communicate wants/needs with 75% accuracy given occasional mod A over 2 sessions   ? Baseline F:3 12/09/21   ? Time 10   ? Period --   visits  ? Status Not Met   ?  ? SLP SHORT TERM GOAL #3  ? Title Pt will verbally approximate 3 significant words for 5/10 trials given occasional fading to rare mod A over 2 sessions   ? Time 10   ? Period --   sessions  ? Status Achieved   ? ?  ?  ? ?  ? ? ? SLP Long Term Goals - 01/27/22 0836   ? ?  ? SLP LONG TERM GOAL #1  ? Title Pt will use multimodal communication system with mod assist to  express wants/needs given >2 choices over 2 sessions for 4/5 opportunities   ? Time 12   ? Period Weeks   ? Status Achieved   ?  ? SLP LONG TERM GOAL #2  ? Title Caregiver will demonstrate appropriate use of supported conversation and multimodal supports to aid pt's comprehension and expression of personally relevant topics, over 4 sessions.   ? Time 6   ? Period Weeks   ? Status Revised   ? Target Date 03/10/22   ?  ? SLP LONG TERM GOAL #3  ? Title Caregiver will appropriately cue patient when apraxic/aphasic errors occurs for 4/5 opportunities over 2 sessions   ? Time 6   ? Period Weeks   ? Status On-going   ? Target Date 03/10/22   ?  ? SLP LONG TERM GOAL #4  ? Title Caregiver will demonstrate ability to modify Lingraphica device with modified independence over 3 sessions.   ? Time 6   ? Period Weeks   ? Status New   ? Target Date 03/10/22   ? ?  ?  ? ?  ? ? ? Plan - 02/01/22 1339   ? ? Clinical Impression Statement Lindsey [name] presents with severe aphasia and severe verbal apraxia, chronic deficits exacerbated by new CVA in December 2022. Pt engaged in personalizing her device again today. Pt continues to increase usuage of spontaneous speech; noted pt using spontaneous speech for simple responses throughout session today. Daughter took active role in programming icons and demonstrated ability to adjust device to pt needs with min cues today. Continue to recommend skilled ST with focus on functional communication to improve ability to communicate wants/needs, reduce frustration/social isolation and improve quality of life.   ? Speech Therapy Frequency 2x / week   ? Duration 12 weeks   ? Treatment/Interventions Compensatory strategies;Patient/family education;Functional tasks;Cueing hierarchy;Environmental controls;Cognitive reorganization;Multimodal communcation approach;Language facilitation;Compensatory techniques;Internal/external aids;SLP instruction and feedback;Other (comment)   ? Potential to [place]   ? Potential Considerations Severity of impairments;Previous level of function   ? SLP Home Exercise Plan bring Lingraphica to next session   ? Consulted and Agree with Plan of Care Patient;Family member/caregiver   ? Family Member Consulted daughter   ? ?  ?  ? ?  ? ? ?Patient will benefit from skilled therapeutic intervention in order to improve the following deficits and impairments:   ?Verbal apraxia ? ?Aphasia ? ?Acute ischemic left MCA stroke ([place]) ? ? ? ?Problem List ?Patient Active Problem List  ? Diagnosis Date Noted  ?  Blood clotting disorder ([employer]) 12/27/2021  ? Elevated AST (SGOT) 10/22/2021  ? 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 Cerebral embolism with cerebral infarction 09/23/2021  ? Pyelonephritis 09/19/2021  ? Pain due to onychomycosis of toenails of both feet 11/19/2020  ? Normocytic anemia 05/31/2020  ? Acute ischemic left MCA stroke ([place]) 04/30/2020  ? Right hemiplegia ([place]) 04/30/2020  ? Cerebrovascular accident [facility]) 04/21/2020  ? Atrial fibrillation with RVR ([place]) 04/21/2020  ? Essential hypertension 04/21/2020  ? Alcohol abuse 04/21/2020  ? ?Lindsey Lever, Lindsey [name], Lindsey [name] ?Speech-Language Pathologist ?([phone] ? ?Lindsey [name], Lindsey [name] ?02/01/2022, 1:42 PM ? ?[place] ?[facility] ?[place], [place], [address] ?Phone: [phone]   Fax:  [phone] ? ? ?Name: Lindsey [name] ?MRN: [id number] ?Date of Birth: [birth year] ? ?

## 2022-02-02 LAB — LUPUS ANTICOAGULANT PANEL
DRVVT: 42.5 s (ref 0.0–47.0)
PTT Lupus Anticoagulant: 40.2 s (ref 0.0–43.5)

## 2022-02-03 ENCOUNTER — Ambulatory Visit: Payer: Medicare Other | Admitting: Occupational Therapy

## 2022-02-03 ENCOUNTER — Ambulatory Visit: Payer: Medicare Other | Admitting: Speech Pathology

## 2022-02-03 ENCOUNTER — Ambulatory Visit: Payer: Medicare Other

## 2022-02-04 ENCOUNTER — Encounter: Payer: Self-pay | Admitting: Oncology

## 2022-02-04 ENCOUNTER — Inpatient Hospital Stay: Payer: Medicare Other | Attending: Oncology | Admitting: Oncology

## 2022-02-04 DIAGNOSIS — N280 Ischemia and infarction of kidney: Secondary | ICD-10-CM | POA: Diagnosis not present

## 2022-02-04 DIAGNOSIS — D649 Anemia, unspecified: Secondary | ICD-10-CM

## 2022-02-04 DIAGNOSIS — I6389 Other cerebral infarction: Secondary | ICD-10-CM | POA: Diagnosis not present

## 2022-02-04 NOTE — Progress Notes (Signed)
HEMATOLOGY-ONCOLOGY TeleHEALTH VISIT PROGRESS NOTE  ?I connected with Lindsey [name] on 02/04/22  at 12:00 PM EDT by video enabled telemedicine visit and verified that I am speaking with the correct person using two identifiers. ?I discussed the limitations, risks, security and privacy concerns of performing an evaluation and management service by telemedicine and the availability of in-person appointments. The patient expressed understanding and agreed to proceed.  ? ?Other persons participating in the visit and their role in the encounter:  ?None ? ?Patient's location: Home  ?Provider's location: office ?Chief Complaint: recurrent stroke and left renal thrombosis ? ? ?INTERVAL HISTORY ?Lindsey [name] is a 65 y.o. female who has above history reviewed by me today presents for follow up visit for recurrent stroke and left renal thrombosis ?patient's daughter called to switch patient's in person visit to virtual visit.  ?Currently patient is on coumadin 2.5mg  M-T, 5mg  F-S  ?No bleeding events.  ?Left side residual weakness.  ?She has physical therapy.  ? ? ?Review of Systems  ?Constitutional:  Negative for appetite change, chills, fatigue and fever.  ?HENT:   Negative for hearing loss and voice change.   ?Eyes:  Negative for eye problems.  ?Respiratory:  Negative for chest tightness and cough.   ?Cardiovascular:  Negative for chest pain.  ?Gastrointestinal:  Negative for abdominal distention, abdominal pain and blood in stool.  ?Endocrine: Negative for hot flashes.  ?Genitourinary:  Negative for difficulty urinating and frequency.   ?Musculoskeletal:  Negative for arthralgias.  ?Skin:  Negative for itching and rash.  ?Neurological:  Positive for extremity weakness.  ?Hematological:  Negative for adenopathy.  ?Psychiatric/Behavioral:  Negative for confusion.    ?Past Medical History:  ?Diagnosis Date  ? Aphasia   ? 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 CVA  (cerebral vascular accident) [facility])   ? Diverticulosis   ? History of ischemic left MCA stroke   ? Hypertension   ? Pain due to onychomycosis of toenails of both feet   ? 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 Uterine prolapse   ? ?History reviewed. No pertinent surgical history.  ?Family History  ?Problem Relation Age of Onset  ? Breast cancer Neg Hx   ?  ?Social History  ? ?Socioeconomic History  ? Marital status: Single  ?  Spouse name: Not on file  ? Number of children: Not on file  ? Years of education: Not on file  ? Highest education level: Not on file  ?Occupational History  ? Not on file  ?Tobacco Use  ? Smoking status: Never  ?  Passive exposure: Never  ? Smokeless tobacco: Never  ?Vaping Use  ? Vaping Use: Never used  ?Substance and Sexual Activity  ? Alcohol use: Not Currently  ?  Comment: states once a wine/beer occassionally   ? Drug use: No  ? Sexual activity: Not Currently  ?Other Topics Concern  ? Not on file  ?Social History Narrative  ? Not on file  ? ?Social Determinants of Health  ? ?Financial Resource Strain: Not on file  ?Food Insecurity: Not on file  ?Transportation Needs: Not on file  ?Physical Activity: Not on file  ?Stress: Not on file  ?Social Connections: Not on file  ?Intimate Partner Violence: Not on file  ?  ?Current Outpatient Medications on File Prior to Visit  ?Medication Sig Dispense Refill  ? acetaminophen (TYLENOL) 325 MG tablet Take 1-2 tablets (325-650 mg total) by mouth every 4 (  four) hours as needed for mild pain.    ? aspirin 81 MG chewable tablet daily.    ? atorvastatin (LIPITOR) 20 MG tablet Take 1 tablet (20 mg total) by mouth daily at 6 PM. 90 tablet 3  ? B Complex-C (B COMPLEX-VITAMIN C) CAPS daily.    ? Baclofen 5 MG TABS Take 1 tablet (5 mg) by mouth at bedtime. 60 tablet 0  ? diltiazem (TIAZAC) 180 MG 24 hr capsule Take 180 mg by mouth daily.    ? levETIRAcetam (KEPPRA) 500 MG tablet Take 1 tablet (500 mg total) by mouth every 12 (twelve) hours. 180 tablet 0  ? megestrol  (MEGACE) 400 MG/10ML suspension Take 10 mLs (400 mg total) by mouth daily. 240 mL 0  ? metoprolol tartrate (LOPRESSOR) 50 MG tablet Take 1 tablet (50 mg total) by mouth 2 (two) times daily. 180 tablet 0  ? Multiple Vitamins-Minerals (MULTIVITAMIN ADULTS 50+) TABS Take 1 tablet by mouth daily.    ? sertraline (ZOLOFT) 25 MG tablet Take 1 tablet (25 mg total) by mouth at bedtime. 90 tablet 0  ? Vitamin D, Ergocalciferol, (DRISDOL) 1.25 MG (50000 UNIT) CAPS capsule Take 1 capsule (50,000 Units total) by mouth every 7 (seven) days. Each Tuesday 12 capsule 0  ? warfarin (COUMADIN) 2.5 MG tablet Take 1 tablet (2.5 mg total) by mouth daily at 4 PM. (Patient taking differently: Take 2.5 mg by mouth daily at 4 PM. 2.5 mg Mond - thurs, 5 mg Friday - sun) 90 tablet 0  ? ?No current facility-administered medications on file prior to visit.  ?  ?No Known Allergies  ? ?  ?Observations/Objective: ?There were no vitals filed for this visit. ?There is no height or weight on file to calculate BMI.  ?Physical Exam ?Neurological:  ?   Mental Status: She is alert.  ? ? ?CBC ?   ?Component Value Date/Time  ? WBC 3.6 (L) 01/28/2022 1356  ? RBC 3.85 (L) 01/28/2022 1356  ? HGB 10.7 (L) 01/28/2022 1356  ? HGB 10.5 (L) 08/17/2014 1806  ? HCT 33.1 (L) 01/28/2022 1356  ? HCT 30.9 (L) 08/17/2014 1806  ? PLT 235 01/28/2022 1356  ? PLT 461 (H) 08/17/2014 1806  ? MCV 86.0 01/28/2022 1356  ? MCV 99 08/17/2014 1806  ? MCH 27.8 01/28/2022 1356  ? MCHC 32.3 01/28/2022 1356  ? RDW 14.9 01/28/2022 1356  ? RDW 16.3 (H) 08/17/2014 1806  ? LYMPHSABS 1.4 01/28/2022 1356  ? MONOABS 0.3 01/28/2022 1356  ? EOSABS 0.1 01/28/2022 1356  ? BASOSABS 0.0 01/28/2022 1356  ?  ?CMP  ?   ?Component Value Date/Time  ? NA 137 01/28/2022 1356  ? NA 141 08/17/2014 1806  ? K 3.9 01/28/2022 1356  ? K 3.2 (L) 08/17/2014 1806  ? CL 103 01/28/2022 1356  ? CL 102 08/17/2014 1806  ? CO2 26 01/28/2022 1356  ? CO2 33 (H) 08/17/2014 1806  ? GLUCOSE 105 (H) 01/28/2022 1356  ? GLUCOSE  92 08/17/2014 1806  ? BUN 12 01/28/2022 1356  ? BUN 6 (L) 08/17/2014 1806  ? CREATININE 0.92 01/28/2022 1356  ? CREATININE 0.57 (L) 08/17/2014 1806  ? CALCIUM 9.3 01/28/2022 1356  ? CALCIUM 8.1 (L) 08/17/2014 1806  ? PROT 8.6 (H) 01/28/2022 1356  ? PROT 7.7 08/17/2014 1806  ? ALBUMIN 3.8 01/28/2022 1356  ? ALBUMIN 3.3 (L) 08/17/2014 1806  ? AST 35 01/28/2022 1356  ? AST 122 (H) 08/17/2014 1806  ? ALT 22 01/28/2022 1356  ?  ALT 183 (H) 08/17/2014 1806  ? ALKPHOS 79 01/28/2022 1356  ? ALKPHOS 107 08/17/2014 1806  ? BILITOT 1.0 01/28/2022 1356  ? BILITOT 0.4 08/17/2014 1806  ? GFRNONAA >60 01/28/2022 1356  ? GFRNONAA >60 08/17/2014 1806  ? GFRAA >60 05/25/2020 0508  ? GFRAA >60 08/17/2014 1806  ?  ? ?Assessment and Plan: ?1. 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 Normocytic anemia   ?3. Cerebrovascular accident (CVA) due to other mechanism [facility])   ?  ?# Recurrent CVA and renal artery thrombosis.  ?Repeat lupus anticoagulant is negative.  ?She is doing well currently with coumadin and I recommend her to continue coumadin with INR 2-3  ? ?#normocytic anemia, likely due to chronic disease.  ?Hemoglobin is stable.  ? ?Follow Up Instructions: ?6 months.  ? ? ?I discussed the assessment and treatment plan with the patient. The patient was provided an opportunity to ask questions and all were answered. The patient agreed with the plan and demonstrated an understanding of the instructions.  ?The patient was advised to call back or seek an in-person evaluation if the symptoms worsen or if the condition fails to improve as anticipated.  ? ?Earlie Server, MD 02/04/2022 11:11 PM  ? ?

## 2022-02-04 NOTE — Progress Notes (Signed)
Chart reviewed and updated with pt's daughter [name].  ?

## 2022-02-08 ENCOUNTER — Ambulatory Visit: Payer: Medicare Other

## 2022-02-08 ENCOUNTER — Ambulatory Visit: Payer: Medicare Other | Admitting: Occupational Therapy

## 2022-02-08 ENCOUNTER — Ambulatory Visit: Payer: Medicare Other | Admitting: Speech Pathology

## 2022-02-08 ENCOUNTER — Encounter: Payer: Self-pay | Admitting: Occupational Therapy

## 2022-02-08 ENCOUNTER — Other Ambulatory Visit: Payer: Self-pay | Admitting: Physical Medicine and Rehabilitation

## 2022-02-08 DIAGNOSIS — R482 Apraxia: Secondary | ICD-10-CM

## 2022-02-08 DIAGNOSIS — R2681 Unsteadiness on feet: Secondary | ICD-10-CM

## 2022-02-08 DIAGNOSIS — R262 Difficulty in walking, not elsewhere classified: Secondary | ICD-10-CM

## 2022-02-08 DIAGNOSIS — R269 Unspecified abnormalities of gait and mobility: Secondary | ICD-10-CM

## 2022-02-08 DIAGNOSIS — I63512 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 middle cerebral artery: Secondary | ICD-10-CM

## 2022-02-08 DIAGNOSIS — M6281 Muscle weakness (generalized): Secondary | ICD-10-CM

## 2022-02-08 DIAGNOSIS — R2689 Other abnormalities of gait and mobility: Secondary | ICD-10-CM

## 2022-02-08 DIAGNOSIS — R4701 Aphasia: Secondary | ICD-10-CM

## 2022-02-08 NOTE — Therapy (Signed)
[facility] ?[place], [place], [address] ?Phone: [phone]   Fax:  [phone] ? ?Physical Therapy Treatment/Recertification for dates 02/08/2022-05/03/2022 ? ?Patient Details  ?Name: Lindsey [name] ?MRN: [id number] ?Date of Birth: Apr 27, 1956 ?Referring Provider (PT): Raulkar, Clide Deutscher, MD ? ? ?Encounter Date: 02/08/2022 ? ? PT End of Session - 02/08/22 0936   ? ? Visit Number 17   ? Number of Visits 24   ? Date for PT Re-Evaluation 05/03/22   ? Authorization Time Period Cert through [id number]; Recert [id number]   ? Progress Note Due on Visit 20   ? PT Start Time 0930   ? PT Stop Time 1014   ? PT Time Calculation (min) 44 min   ? Equipment Utilized During Treatment Gait belt   ? Activity Tolerance Patient limited by fatigue   ? Behavior During Therapy [facility] for tasks assessed/performed   ? ?  ?  ? ?  ? ? ?Past Medical History:  ?Diagnosis Date  ? Aphasia   ? 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 CVA (cerebral vascular accident) [facility])   ? Diverticulosis   ? History of ischemic left MCA stroke   ? Hypertension   ? Pain due to onychomycosis of toenails of both feet   ? 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 Uterine prolapse   ? ? ?History reviewed. No pertinent surgical history. ? ?There were no vitals filed for this visit. ? ? Subjective Assessment - 02/08/22 0935   ? ? Subjective Patient denies any pain today and states feeling okay.   ? Patient is accompained by: Family member   [name]  ? Pertinent History Pt was doing very well and was walking short distances wqith a cane prior to second stroke in december. At this point she is limited to wheelchair as her main means of mobility. Pt caregiver reports all detailed information. Pt has hesitation with the left lower extremity and significant weakness on the right lower extremity. Pt will take 2-3 steps when transfering. Pt caregiver assists with  all transfers.Pt has shower with ledge to steps over ledge when entering and utilizes bedside commode. Pt has 2 level home with chair lift for upstairs access. Pt would like to improve ransfers, improve neglect on her R side and improve her overall strength and mobility.Pt. is a 66 y.o. 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 Pt. has Right sided weakness, and receptive, and expressive aphasia. Pt. has had a previous CVA with right sided weakness in July 2021. PMHx includes: AFib, Renal Artery thrombosis, situational depression, HTN, Pyelnephritis, Seizure activity, COnstipation, AKI. Vitamin D deficiency, and urinary incontinence.   ? Limitations Standing;Walking;House hold activities   ? How long can you sit comfortably? n/a   ? How long can you stand comfortably? 2 min   ? How long can you walk comfortably? 3-4 steps   ? Patient Stated Goals Improve balance and functional movements   ? Currently in Pain? No/denies   ? ?  ?  ? ?  ? ? ? ? ? [facility] PT Assessment - 02/08/22 0956   ? ?  ? Berg Balance Test  ? Sit to Stand Needs minimal aid to stand or to stabilize   ? Standing Unsupported Able to stand 30 seconds unsupported   ? Sitting with Back Unsupported but Feet Supported on Floor  or Stool Able to sit safely and securely 2 minutes   ? Stand to Sit Uses backs of legs against chair to control descent   ? Transfers Needs one person to assist   ? Standing Unsupported with Eyes Closed Able to stand 3 seconds   ? Standing Unsupported with Feet Together Needs help to attain position and unable to hold for 15 seconds   ? From Standing, Reach Forward with Outstretched Arm Reaches forward but needs supervision   ? From Standing Position, Pick up Object from Floor Unable to try/needs assist to keep balance   ? From Standing Position, Turn to Look Behind Over each Shoulder Needs assist to keep from losing balance and falling   ? Turn 360 Degrees  Needs assistance while turning   ? Standing Unsupported, Alternately Place Feet on Step/Stool Needs assistance to keep from falling or unable to try   ? Standing Unsupported, One Foot in [employer] balance while stepping or standing   ? Standing on One Leg Unable to try or needs assist to prevent fall   ? Total Score 13   ? ?  ?  ? ?  ? ? ? ?INTERVENTIONS:  ? ? ?Reassessed all goals for Recert visit: ? ?HEP: Patient continues to require VC for reminders and assist with hip march/knee ext (seated for strengthening); Reminders to continue to work on static stand at home.  ? ?FOTO: Patient unable to complete today- No family present but will attempt again next visit available with family present to assist in completion.  ? ?5XSTS: 54 sec with use of L UE support and Min physical assist to position right LE.  ? ?BERG: 13/56 ? ?Transfer: SPT observed - Patient attempted to perform on her own- Unable to push herself up from chair- Very min assist to position right LE into more flexed position and VC for hand placement. Patient able to stand with min Assist with use of gait belt and VC to lean  ? ?Add gait distance (total today = 40 feet)  and 10MWT= 0.05 m/s (202 sec using hemiwalker) goals ? ? ? ? ? ? ? ? ? ? ? ? ? ? PT Education - 02/08/22 0936   ? ? Education Details Gait sequencing education; exercise technique   ? Person(s) Educated Patient   ? Methods Explanation;Demonstration;Tactile cues;Verbal cues   ? Comprehension Verbalized understanding;Returned demonstration;Verbal cues required;Tactile cues required;Need further instruction   ? ?  ?  ? ?  ? ? ? PT Short Term Goals - 02/08/22 2210   ? ?  ? PT SHORT TERM GOAL #1  ? Title Patient will be independent in home exercise program to improve strength/mobility for better functional independence with ADLs.   ? Baseline no formal HEp for LE/balance at this time; 02/08/2022=Patient continues to require VC for reminders and assist with hip march/knee ext (seated for  strengthening); Reminders to continue to work on static stand at home.   ? Time 4   ? Period Weeks   ? Status New   ? Target Date 12/14/21   ? ?  ?  ? ?  ? ? ? ? PT Long Term Goals - 02/08/22 2212   ? ?  ? PT LONG TERM GOAL #1  ? Title Patient will increase FOTO score to equal to or greater than   41  to demonstrate statistically significant improvement in mobility and quality of life.   ? Baseline 16 on 2/14; 02/08/2022=Patient unable to complete today-  No family present but will attempt again next visit available with family present to assist in completion.   ? Time 12   ? Period Weeks   ? Status On-going   ? Target Date 05/03/22   ?  ? PT LONG TERM GOAL #3  ? Title Patient  will complete five times sit to stand test in < 30 seconds with UE assistance indicating an increased LE strength and improved balance.   ? Baseline 58.47 s with UE assist and with A from PT for R LE positioning; 02/08/2022=54 sec with use of L UE support and Min physical assist to position right LE.   ? Time 12   ? Period Weeks   ? Status New   ? Target Date 05/03/22   ?  ? PT LONG TERM GOAL #4  ? Title Patient will increase Berg Balance score by > 6 points to demonstrate decreased fall risk during functional activities.   ? Baseline 12 on 2/14; 02/08/2022= 13/56   ? Time 12   ? Period Weeks   ? Status On-going   ? Target Date 05/03/22   ?  ? PT LONG TERM GOAL #5  ? Title Pt will transfer from seated position on table/chair to/from transport chair/WC in order to indicate improved transfer ability.   ? Baseline Requires Min A for LE positioning and to prevent LOB. 02/08/2022=Patient attempted to perform on her own- Unable to push herself up from chair- Very min assist to position right LE into more flexed position and VC for hand placement. Patient able to stand with min Assist with use of gait belt and VC to lean   ? Time 12   ? Period Weeks   ? Status On-going   ? Target Date 05/03/22   ?  ? Additional Long Term Goals  ? Additional Long Term Goals  Yes   ?  ? PT LONG TERM GOAL #6  ? Title Patient will ambulate > 150 feet with CGA using Hemiwalker on level surfaces for improved household and short community distances.   ? Baseline 02/08/2022= 40 feet with use o

## 2022-02-08 NOTE — Therapy (Signed)
[facility] ?[place], [place], [address] ?Phone: [phone]   Fax:  [phone] ? ?Occupational Therapy Treatment ? ?Patient Details  ?Name: Lindsey [name] ?MRN: [id number] ?Date of Birth: Jun 28, 1956 ?Referring Provider (OT): Dr. Ranell Patrick ? ? ?Encounter Date: 02/08/2022 ? ? OT End of Session - 02/08/22 1431   ? ? Visit Number 27   ? Number of Visits 48   ? Date for OT Re-Evaluation 03/31/22   ? Authorization Type Progress report period starting 12/02/2021   ? OT Start Time 1020   ? OT Stop Time 1100   ? OT Time Calculation (min) 40 min   ? Activity Tolerance Patient tolerated treatment well   ? Behavior During Therapy [facility] for tasks assessed/performed   ? ?  ?  ? ?  ? ? ?Past Medical History:  ?Diagnosis Date  ? Aphasia   ? 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 CVA (cerebral vascular accident) [facility])   ? Diverticulosis   ? History of ischemic left MCA stroke   ? Hypertension   ? Pain due to onychomycosis of toenails of both feet   ? 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 Uterine prolapse   ? ? ?History reviewed. No pertinent surgical history. ? ?There were no vitals filed for this visit. ? ? Subjective Assessment - 02/08/22 1431   ? ? Subjective  Pt reports she's feeling fine today.   ? Patient is accompanied by: Family member   ? Pertinent History Pt. is a 66 y.o. 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 Pt. has Right sided weakness, and receptive, and expressive aphasia. Pt. has had a previous CVA with right sided weakness in July 2021. PMHx includes: AFib, Renal Artery thrombosis, situational depression, HTN, Pyelnephritis, Seizure activity, COnstipation, AKI. Vitamin D deficiency, and urinary incontinence/   ? Currently in Pain? No/denies   ? ?  ?  ? ?  ? ? ?  ?OT TREATMENT   ?  ?Manual Therapy: ?  ?Pt. Tolerated soft  tissue massage to the right scapular musculature. Pt. tolerated scapular mobilizations in elevation, depression, abduction, and rotation following moist heat modality to the right shoulder. Pt. Tolerated soft tissue mobilizations for radius on ulna, and metacarpal spread stretches to decrease tightness, and prepare the UE, and hand for ROM and therapeutic Ex. Pt. Worked on BUE strengthening, and reciprocal motion using the UBE while seated for 8 min. with no resistance. Constant monitoring was provided, along with support, and an ACE wrap in place to the right hand. ?  ?Therapeutic Exercise: ?  ?Pt. tolerated PROM through all joint ranges of the RUE, and hand. Pt. performed self-ROM to the right MPs, PIPs, and DIPs.  Pt. Worked on BUE strengthening, and reciprocal motion using the UBE while seated for 5 min. with no resistance. Constant monitoring was provided, along with support, and an ACE wrap in place to the right hand. ?  ?  ?Pt. presents with flexor tone, and tightness. Pt. Continues to respond well to the session with less tone, and tightness noted proximally following ROM, and manual therapy. Pt. Presented with increased tightness with right shoulder abduction. Pt. continues to work on normalizing tone, and facilitating active volitional movement in the RUE, and hand to improve engagement of the RUE during ADLs, as well as improve transfers, and functional mobility for toileting  ?  ? ? ? ? ? ? ? ? ? ? ? ? ? ? ? ? ? ? ? ?  OT Education - 02/08/22 1431   ? ? Education Details RUE functioning   ? Person(s) Educated Patient   ? Methods Explanation;Verbal cues;Tactile cues;Demonstration   ? Comprehension Verbal cues required;Need further instruction;Returned demonstration;Tactile cues required;Verbalized understanding   ? ?  ?  ? ?  ? ? ? OT Short Term Goals - 01/06/22 1117   ? ?  ? OT SHORT TERM GOAL #1  ? Title Pt and caregiver will be independent with HEP for RUE   ? Baseline Eval: No current program. 10th  visit: Caregivers  report being comfortable with exercises at home. 01/06/2022: Continue ongoing HEPs for UE   ? Time 6   ? Period Weeks   ? Status On-going   ? Target Date 02/17/22   ? ?  ?  ? ?  ? ? ? ? OT Long Term Goals - 01/06/22 1120   ? ?  ? OT LONG TERM GOAL #1  ? Title Pt. will engage engage the RUE as a gross assist/atabilizer during ADLs 100% of the time   ? Baseline 01/06/2022: Pt. is starting to engage her RUE as a gross stabiliizer with increased cues. Eval: pt. does not currently engage her RUE during ADLs. 10th visit: Pt. continues to need to work towards using her RUE as a gross stabilizer.   ? Time 12   ? Period Weeks   ? Status On-going   ? Target Date 03/31/22   ?  ? OT LONG TERM GOAL #2  ? Title Pt. will perform UE dressing using one armed dressing techniques   ? Baseline Eval: MaxA, 10th visit: ModA 01/06/2022: min-modA   ? Time 12   ? Period Weeks   ? Status On-going   ? Target Date 03/31/22   ?  ? OT LONG TERM GOAL #3  ? Title Pt. will require minA to stand and hike pants   ? Baseline Eval: MaxA, 10th visit: ModA 01/06/2022: ModA   ? Time 12   ? Period Weeks   ? Target Date 03/31/22   ?  ? OT LONG TERM GOAL #4  ? Title Pt. will perform LE dressing with minA   ? Baseline Eval: MaxA 10th visit: Pt. requires MaxA for donning her brace, shoes, and socks. ModA with pants. 01/06/2022: ModA pants, MaxA shoes, socks, and brace   ? Time 12   ? Period Weeks   ? Status On-going   ? Target Date 01/21/22   ?  ? OT LONG TERM GOAL #5  ? Title Pt. will improve FOTO score by 2 pt. to reflect funcational change   ? Baseline Eval: FOTO score: 12, 10th visit: FOTO score 27 01/06/2022: 27   ? Time 12   ? Period Weeks   ? Status On-going   ? Target Date 03/31/22   ?  ? OT LONG TERM GOAL #6  ? Title Patient will transfer to bedside commode or toilet with supervision.   ? Baseline 01/06/2022: min-ModA   ? Time 4   ? Period Weeks   ? Status On-going   ? Target Date 03/31/22   ? ?  ?  ? ?  ? ? ? ? ? ? ? ? Plan - 02/08/22  1432   ? ? Clinical Impression Statement Pt. presents with flexor tone, and tightness. Pt. Continues to respond well to the session with less tone, and tightness noted proximally following ROM, and manual therapy. Pt. Presented with increased tightness with right shoulder abduction. Pt. continues  to work on normalizing tone, and facilitating active volitional movement in the RUE, and hand to improve engagement of the RUE during ADLs, as well as improve transfers, and functional mobility for toileting  ?   ? OT Occupational Profile and History Detailed Assessment- Review of Records and additional review of physical, cognitive, psychosocial history related to current functional performance   ? Occupational performance deficits (Please refer to evaluation for details): Leisure;IADL's;ADL's   ? Body Structure / Function / Physical Skills ADL;IADL;FMC;ROM;UE functional use;Strength;Decreased knowledge of use of DME;Coordination   ? Rehab Potential Fair   ? Clinical Decision Making Several treatment options, min-mod task modification necessary   ? Comorbidities Affecting Occupational Performance: May have comorbidities impacting occupational performance   ? Modification or Assistance to Complete Evaluation  Min-Moderate modification of tasks or assist with assess necessary to complete eval   ? OT Frequency 2x / week   ? OT Duration 12 weeks   ? OT Treatment/Interventions Self-care/ADL training;Neuromuscular education;Electrical Stimulation;Patient/family education;Therapeutic activities;Passive range of motion;Therapist, nutritional;Therapeutic exercise;DME and/or AE instruction;Moist Heat   ? Consulted and Agree with Plan of Care Patient   ? ?  ?  ? ?  ? ? ?Patient will benefit from skilled therapeutic intervention in order to improve the following deficits and impairments:   ?Body Structure / Function / Physical Skills: ADL, IADL, FMC, ROM, UE functional use, Strength, Decreased knowledge of use of DME,  Coordination ?  ?  ? ? ?Visit Diagnosis: ?Muscle weakness (generalized) ? ? ? ?Problem List ?Patient Active Problem List  ? Diagnosis Date Noted  ? Blood clotting disorder ([place]) 12/27/2021  ? Elevated AST ([facility]) 01/20/

## 2022-02-09 NOTE — Therapy (Signed)
[place] ?[facility] ?[place], [place], [address] ?Phone: [phone]   Fax:  [phone] ? ?Speech Language Pathology Treatment ? ?Patient Details  ?Name: Lindsey [name] ?MRN: [id number] ?Date of Birth: Nov 25, 1955 ?Referring Provider (SLP): Dr. Ranell Patrick ? ? ?Encounter Date: 02/08/2022 ? ? End of Session - 02/09/22 1911   ? ? Visit Number 22   ? Number of Visits 25   ? Date for SLP Re-Evaluation 03/10/22   ? Authorization Type Medicare/ medicaid secondary   ? SLP Start Time 1100   ? SLP Stop Time  1200   ? SLP Time Calculation (min) 60 min   ? Activity Tolerance Patient tolerated treatment well   ? ?  ?  ? ?  ? ? ?Past Medical History:  ?Diagnosis Date  ? Aphasia   ? 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 CVA (cerebral vascular accident) [facility])   ? Diverticulosis   ? History of ischemic left MCA stroke   ? Hypertension   ? Pain due to onychomycosis of toenails of both feet   ? 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 Uterine prolapse   ? ? ?No past surgical history on file. ? ?There were no vitals filed for this visit. ? ? Subjective Assessment - 02/08/22 2246   ? ? Subjective pt arrived to therapy by herself   ? Currently in Pain? No/denies   ? ?  ?  ? ?  ? ? ? ? ? ? ? ? ADULT SLP TREATMENT - 02/09/22 0001   ? ?  ? Treatment Provided  ? Treatment provided Cognitive-Linquistic   ?  ? Cognitive-Linquistic Treatment  ? Treatment focused on Aphasia;Apraxia;Patient/family/caregiver education   ? Skilled Treatment Pt reported that daughter was in "the car." SLP reached out to pt's daughter to traget caregiver training. Pt agreeable to joining session. Pt's daughter reported that she added the requested info and pictures that were given as homework from previous session. She reports increased comfort level with adding new icons but reports an overall comfort level of 5 when using the device as a whole. She reports  difficulty knowing where icons/information is located on the device. Education provided on having pt use the device more frequently to increase familiarity with device as well as promote pt's language abilities and reduce communication breakdowns that results in pt frustration. Pt and daughter would benefit from further discussion provided by pt's primary therapist. SLP further facilitated session by introducing "search" as a quick method to add icons. To give pt additional info to communicate URL link was added under Keight Sweat to link his song "Make It Last." Finally, SLP asked pt to locate various icons in device. Unfortunately, pt required maximal multimodal assistance to locate icons/information.   ? ?  ?  ? ?  ? ? ? SLP Education - 02/09/22 1910   ? ? Education Details using device to express wants/needs   ? Person(s) Educated Patient;Child(ren)   ? Methods Explanation;Demonstration;Verbal cues   ? Comprehension Verbalized understanding   ? ?  ?  ? ?  ? ? ? SLP Short Term Goals - 12/13/21 0924   ? ?  ? SLP SHORT TERM GOAL #1  ? Title Pt will answer personally relevant Y/N questions in context using multimodal communication or AAC if necessary, 75% accuracy.   ? Time 10   ? Period --   sessions  ? Status Achieved   ?  ?  SLP SHORT TERM GOAL #2  ? Title Given verbal or visual prompt, pt will correctly ID written word/object/icon in field of 4-6 to communicate wants/needs with 75% accuracy given occasional mod A over 2 sessions   ? Baseline F:3 12/09/21   ? Time 10   ? Period --   visits  ? Status Not Met   ?  ? SLP SHORT TERM GOAL #3  ? Title Pt will verbally approximate 3 significant words for 5/10 trials given occasional fading to rare mod A over 2 sessions   ? Time 10   ? Period --   sessions  ? Status Achieved   ? ?  ?  ? ?  ? ? ? SLP Long Term Goals - 01/27/22 0836   ? ?  ? SLP LONG TERM GOAL #1  ? Title Pt will use multimodal communication system with mod assist to express wants/needs given >2 choices over  2 sessions for 4/5 opportunities   ? Time 12   ? Period Weeks   ? Status Achieved   ?  ? SLP LONG TERM GOAL #2  ? Title Caregiver will demonstrate appropriate use of supported conversation and multimodal supports to aid pt's comprehension and expression of personally relevant topics, over 4 sessions.   ? Time 6   ? Period Weeks   ? Status Revised   ? Target Date 03/10/22   ?  ? SLP LONG TERM GOAL #3  ? Title Caregiver will appropriately cue patient when apraxic/aphasic errors occurs for 4/5 opportunities over 2 sessions   ? Time 6   ? Period Weeks   ? Status On-going   ? Target Date 03/10/22   ?  ? SLP LONG TERM GOAL #4  ? Title Caregiver will demonstrate ability to modify Lingraph[employer] device with modified independence over 3 sessions.   ? Time 6   ? Period Weeks   ? Status New   ? Target Date 03/10/22   ? ?  ?  ? ?  ? ? ? Plan - 02/09/22 1911   ? ? Clinical Impression Statement Cleaster presents with severe aphasia and severe verbal apraxia, chronic deficits exacerbated by new CVA in December 2022. Pt engaged in personalizing her device again today. Pt continues to increase usuage of spontaneous speech; noted pt using spontaneous speech for simple responses throughout session today. Daughter took active role in programming icons and demonstrated ability to adjust device to pt needs with min cues today. Continue to recommend skilled ST with focus on functional communication to improve ability to communicate wants/needs, reduce frustration/social isolation and improve quality of life.   ? Speech Therapy Frequency 2x / week   ? Duration 12 weeks   ? Treatment/Interventions Compensatory strategies;Patient/family education;Functional tasks;Cueing hierarchy;Environmental controls;Cognitive reorganization;Multimodal communcation approach;Language facilitation;Compensatory techniques;Internal/external aids;SLP instruction and feedback;Other (comment)   ? Potential to [place]   ? Potential Considerations Severity  of impairments;Previous level of function   ? Consulted and Agree with Plan of Care Patient;Family member/caregiver   ? Family Member Consulted daughter   ? ?  ?  ? ?  ? ? ?Patient will benefit from skilled therapeutic intervention in order to improve the following deficits and impairments:   ?Aphasia ? ?Apraxia ? ? ? ?Problem List ?Patient Active Problem List  ? Diagnosis Date Noted  ? Blood clotting disorder ([place]) 12/27/2021  ? Elevated AST (SGOT) 10/22/2021  ? 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 Cerebral embolism with cerebral infarction 09/23/2021  ? Pyelonephritis 09/19/2021  ? Pain due to onychomycosis of toenails of both feet 11/19/2020  ? Normocytic anemia 05/31/2020  ? Acute ischemic left MCA stroke ([place]) 04/30/2020  ? Right hemiplegia ([place]) 04/30/2020  ? Cerebrovascular accident [facility]) 04/21/2020  ? Atrial fibrillation with RVR ([place]) 04/21/2020  ? Essential hypertension 04/21/2020  ? Alcohol abuse 04/21/2020  ? ?Eily Louvier B. Rutherford Nail, M.S., CCC-SLP, CBIS ?Speech-Language Pathologist ?Rehabilitation Services ?Office [phone] ? ?[place], CCC-SLP ?02/09/2022, 7:13 PM ? ?[place] ?[facility] ?[place], [place], [address] ?Phone: [phone]   Fax:  [phone] ? ? ?Name: Cally Nygard ?MRN: [id number] ?Date of Birth: Nov 13, 1955 ? ?

## 2022-02-10 ENCOUNTER — Ambulatory Visit: Payer: Medicare Other | Admitting: Speech Pathology

## 2022-02-10 ENCOUNTER — Ambulatory Visit: Payer: Medicare Other | Admitting: Occupational Therapy

## 2022-02-10 ENCOUNTER — Ambulatory Visit: Payer: Medicare Other

## 2022-02-10 ENCOUNTER — Encounter: Payer: Self-pay | Admitting: Occupational Therapy

## 2022-02-10 DIAGNOSIS — R262 Difficulty in walking, not elsewhere classified: Secondary | ICD-10-CM

## 2022-02-10 DIAGNOSIS — M6281 Muscle weakness (generalized): Secondary | ICD-10-CM

## 2022-02-10 DIAGNOSIS — R269 Unspecified abnormalities of gait and mobility: Secondary | ICD-10-CM

## 2022-02-10 DIAGNOSIS — R2689 Other abnormalities of gait and mobility: Secondary | ICD-10-CM

## 2022-02-10 DIAGNOSIS — R2681 Unsteadiness on feet: Secondary | ICD-10-CM

## 2022-02-10 DIAGNOSIS — I63512 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 middle cerebral artery: Secondary | ICD-10-CM

## 2022-02-10 NOTE — Therapy (Signed)
[place] ?[facility] ?[place], [place], [address] ?Phone: [phone]   Fax:  [phone] ? ?Physical Therapy Treatment ? ?Patient Details  ?Name: Lindsey [name] ?MRN: [id number] ?Date of Birth: Sep 21, 1956 ?Referring Provider (PT): Raulkar, Clide Deutscher, MD ? ? ?Encounter Date: 02/10/2022 ? ? PT End of Session - 02/10/22 0950   ? ? Visit Number 18   ? Number of Visits 24   ? Date for PT Re-Evaluation 05/03/22   ? Authorization Time Period Cert through [id number]; Recert [id number]   ? Progress Note Due on Visit 20   ? PT Start Time [phone]   ? PT Stop Time 1014   ? PT Time Calculation (min) 35 min   ? Equipment Utilized During Treatment Gait belt   ? Activity Tolerance Patient limited by fatigue   ? Behavior During Therapy [facility] for tasks assessed/performed   ? ?  ?  ? ?  ? ? ?Past Medical History:  ?Diagnosis Date  ? Aphasia   ? 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 CVA (cerebral vascular accident) [facility])   ? Diverticulosis   ? History of ischemic left MCA stroke   ? Hypertension   ? Pain due to onychomycosis of toenails of both feet   ? 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 Uterine prolapse   ? ? ?History reviewed. No pertinent surgical history. ? ?There were no vitals filed for this visit. ? ? Subjective Assessment - 02/10/22 0947   ? ? Subjective Patient reports having some issues with her splint this morning and requesting assistance.   ? Patient is accompained by: Family member   [name]  ? Pertinent History Pt was doing very well and was walking short distances wqith a cane prior to second stroke in december. At this point she is limited to wheelchair as her main means of mobility. Pt caregiver reports all detailed information. Pt has hesitation with the left lower extremity and significant weakness on the right lower extremity. Pt will take 2-3 steps when transfering. Pt caregiver assists with all  transfers.Pt has shower with ledge to steps over ledge when entering and utilizes bedside commode. Pt has 2 level home with chair lift for upstairs access. Pt would like to improve ransfers, improve neglect on her R side and improve her overall strength and mobility.Pt. is a 66 y.o. 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 Pt. has Right sided weakness, and receptive, and expressive aphasia. Pt. has had a previous CVA with right sided weakness in July 2021. PMHx includes: AFib, Renal Artery thrombosis, situational depression, HTN, Pyelnephritis, Seizure activity, COnstipation, AKI. Vitamin D deficiency, and urinary incontinence.   ? Limitations Standing;Walking;House hold activities   ? How long can you sit comfortably? n/a   ? How long can you stand comfortably? 2 min   ? How long can you walk comfortably? 3-4 steps   ? Patient Stated Goals Improve balance and functional movements   ? Currently in Pain? No/denies   ? ?  ?  ? ?  ? ? ?INTERVENTIONS ? ?Seated LE strengthening: ?- Assisted seated march on right/AROM on Left x 15 reps  ?- Assisted seated knee ext on right/AROM on left x 15 reps ?- Heel slide (pillowcase under Right heel) x 15 reps - AROM ?- Seated hip add with ball - 5 sec hold x 12  reps ? ? ?Sit to stand- Physical assist to flex right knee and keep right foot in position using LUE for push up x 10 reps.  ? ?Heel to toe sequencing exercise- placed 2 cones on floor and instructed patient to advance Leg forward for heel strike then back into Toe off - x 15 reps on Right then 15 more reps on left.  ? ? ?Gait training:  ?  ?Patient ambulated approx 75 feet using hemiwalker on left side, CGA with max VC for gait sequencing- Step to gait with minimal step length and difficulty sequencing. Patient limited by decreased functional endurance.  ?  ? ?Education provided throughout session via VC/TC and demonstration to facilitate movement  at target joints and correct muscle activation for all testing and exercises performed.  ? ? ? ? ? ? ? ? ? ? ? ? ? ? ? ? ? ? ? ? ? PT Education - 02/10/22 0949   ? ? Education Details Exercise technique   ? Person(s) Educated Patient   ? Methods Explanation;Demonstration;Tactile cues;Verbal cues   ? Comprehension Verbalized understanding;Returned demonstration;Verbal cues required;Tactile cues required;Need further instruction   ? ?  ?  ? ?  ? ? ? PT Short Term Goals - 02/08/22 2210   ? ?  ? PT SHORT TERM GOAL #1  ? Title Patient will be independent in home exercise program to improve strength/mobility for better functional independence with ADLs.   ? Baseline no formal HEp for LE/balance at this time; 02/08/2022=Patient continues to require VC for reminders and assist with hip march/knee ext (seated for strengthening); Reminders to continue to work on static stand at home.   ? Time 4   ? Period Weeks   ? Status New   ? Target Date 12/14/21   ? ?  ?  ? ?  ? ? ? ? PT Long Term Goals - 02/08/22 2212   ? ?  ? PT LONG TERM GOAL #1  ? Title Patient will increase FOTO score to equal to or greater than   41  to demonstrate statistically significant improvement in mobility and quality of life.   ? Baseline 16 on 2/14; 02/08/2022=Patient unable to complete today- No family present but will attempt again next visit available with family present to assist in completion.   ? Time 12   ? Period Weeks   ? Status On-going   ? Target Date 05/03/22   ?  ? PT LONG TERM GOAL #3  ? Title Patient  will complete five times sit to stand test in < 30 seconds with UE assistance indicating an increased LE strength and improved balance.   ? Baseline 58.47 s with UE assist and with A from PT for R LE positioning; 02/08/2022=54 sec with use of L UE support and Min physical assist to position right LE.   ? Time 12   ? Period Weeks   ? Status New   ? Target Date 05/03/22   ?  ? PT LONG TERM GOAL #4  ? Title Patient will increase Berg Balance score by >  6 points to demonstrate decreased fall risk during functional activities.   ? Baseline 12 on 2/14; 02/08/2022= 13/56   ? Time 12   ? Period Weeks   ? Status On-going   ? Target Date 05/03/22   ?  ? PT LONG TERM GOAL #5  ? Title Pt will transfer from seated position on table/chair to/from transport chair/WC in order to indicate  improved transfer ability.   ? Baseline Requires Min A for LE positioning and to prevent LOB. 02/08/2022=Patient attempted to perform on her own- Unable to push herself up from chair- Very min assist to position right LE into more flexed position and VC for hand placement. Patient able to stand with min Assist with use of gait belt and VC to lean   ? Time 12   ? Period Weeks   ? Status On-going   ? Target Date 05/03/22   ?  ? Additional Long Term Goals  ? Additional Long Term Goals Yes   ?  ? PT LONG TERM GOAL #6  ? Title Patient will ambulate > 150 feet with CGA using Hemiwalker on level surfaces for improved household and short community distances.   ? Baseline 02/08/2022= 40 feet with use of HW, heavy CGA with max VC for gait sequencing and w/c follow   ? Time 12   ? Period Weeks   ? Status New   ? Target Date 05/03/22   ?  ? PT LONG TERM GOAL #7  ? Title Pt will increase 10MWT by at least 0.13 m/s in order to demonstrate clinically significant improvement in community ambulation.   ? Baseline 02/08/2022= 0.05 m/s using HW   ? Time 12   ? Period Weeks   ? Status New   ? Target Date 05/03/22   ? ?  ?  ? ?  ? ? ? ? ? ? ? ? Plan - 02/10/22 0951   ? ? Clinical Impression Statement Patient presented with good motivation today and participated well in seated and standing activities. She was able to stand with less overall VC and physical assistance today and demo much improved overall gait quality with faster cadence and slightly increased distance. She would benefit from additional skilled PT intervention to improve strength, balance and mobility   ? Personal Factors and Comorbidities Age;Comorbidity  1;Comorbidity 2;Comorbidity 3+;Time since onset of injury/illness/exacerbation;Other   ? Comorbidities AFib, Renal Artery thrombosis, situational depression, HTN, Pyelnephritis, Seizure activity, COnstipati

## 2022-02-10 NOTE — Therapy (Signed)
[place] ?[facility] ?[address] ?[place], [place], [address] ?Phone: [phone]   Fax:  [phone] ? ?Occupational Therapy Treatment ? ?Patient Details  ?Name: Lindsey [name] ?MRN: [id number] ?Date of Birth: 22-Mar-1956 ?Referring Provider (OT): Dr. Carlis Abbott ? ? ?Encounter Date: 02/10/2022 ? ? OT End of Session - 02/10/22 1735   ? ? Visit Number 28   ? Number of Visits 48   ? Date for OT Re-Evaluation 03/31/22   ? Authorization Type Progress report period starting 12/02/2021   ? OT Start Time 1015   ? OT Stop Time 1100   ? OT Time Calculation (min) 45 min   ? Activity Tolerance Patient tolerated treatment well   ? Behavior During Therapy [facility] for tasks assessed/performed   ? ?  ?  ? ?  ? ? ?Past Medical History:  ?Diagnosis Date  ? Aphasia   ? 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 CVA (cerebral vascular accident) [facility])   ? Diverticulosis   ? History of ischemic left MCA stroke   ? Hypertension   ? Pain due to onychomycosis of toenails of both feet   ? 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HCC)   ? Uterine prolapse   ? ? ?History reviewed. No pertinent surgical history. ? ?There were no vitals filed for this visit. ? ? Subjective Assessment - 02/10/22 1735   ? ? Subjective  Pt reports she's feeling fine today.   ? Pertinent History Pt. is a 66 y.o. 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 Pt. has Right sided weakness, and receptive, and expressive aphasia. Pt. has had a previous CVA with right sided weakness in July 2021. PMHx includes: AFib, Renal Artery thrombosis, situational depression, HTN, Pyelnephritis, Seizure activity, COnstipation, AKI. Vitamin D deficiency, and urinary incontinence/   ? Currently in Pain? No/denies   ? ?  ?  ? ?  ? ?OT TREATMENT   ?  ?Manual Therapy: ?  ?Pt. Tolerated soft tissue massage to the right scapular musculature.  Pt. tolerated scapular mobilizations in elevation, depression, abduction, and rotation following moist heat modality to the right shoulder. Pt. Tolerated soft tissue mobilizations for radius on ulna, and metacarpal spread stretches to decrease tightness, and prepare the UE, and hand for ROM and therapeutic Ex. Pt. Worked on BUE strengthening, and reciprocal motion using the UBE while seated for 8 min. with no resistance. Constant monitoring was provided, along with support, and an ACE wrap in place to the right hand. ?  ?Therapeutic Exercise: ?  ?Pt. tolerated PROM through all joint ranges of the RUE, and hand. Pt. performed self-ROM to the right MPs, PIPs, and DIPs.  Pt. worked on [employer], and reciprocal motion using the UBE while seated for 5 min. with no resistance. Constant monitoring was provided, along with support, and an ACE wrap in place to the right hand. ?  ?Pt. presents with flexor tone, and tightness. Pt. Continues to respond well to the session with less tone, and tightness noted proximally following ROM, and manual therapy. Pt. presented with increased tightness with right shoulder abduction initially, however tolerated increased right shoulder abduction today. Reviewed positioning for increased shoulder abduction in sitting with the pt. And her grandson.  Pt. continues to work on normalizing tone, and facilitating active volitional movement in the RUE, and hand to improve engagement of the RUE during ADLs, as  well as improve transfers, and functional mobility for toileting  ?  ?  ? ? ? ? ? ? ? ? ? ? ? ? ? ? ? ? ? ? ? ? ? OT Education - 02/10/22 1735   ? ? Education Details RUE functioning   ? Person(s) Educated Patient   ? Methods Explanation;Verbal cues;Tactile cues;Demonstration   ? Comprehension Verbal cues required;Need further instruction;Returned demonstration;Tactile cues required;Verbalized understanding   ? ?  ?  ? ?  ? ? ? OT Short Term Goals - 01/06/22 1117   ? ?  ? OT SHORT TERM  GOAL #1  ? Title Pt and caregiver will be independent with HEP for RUE   ? Baseline Eval: No current program. 10th visit: Caregivers  report being comfortable with exercises at home. 01/06/2022: Continue ongoing HEPs for UE   ? Time 6   ? Period Weeks   ? Status On-going   ? Target Date 02/17/22   ? ?  ?  ? ?  ? ? ? ? OT Long Term Goals - 01/06/22 1120   ? ?  ? OT LONG TERM GOAL #1  ? Title Pt. will engage engage the RUE as a gross assist/atabilizer during ADLs 100% of the time   ? Baseline 01/06/2022: Pt. is starting to engage her RUE as a gross stabiliizer with increased cues. Eval: pt. does not currently engage her RUE during ADLs. 10th visit: Pt. continues to need to work towards using her RUE as a gross stabilizer.   ? Time 12   ? Period Weeks   ? Status On-going   ? Target Date 03/31/22   ?  ? OT LONG TERM GOAL #2  ? Title Pt. will perform UE dressing using one armed dressing techniques   ? Baseline Eval: MaxA, 10th visit: ModA 01/06/2022: min-modA   ? Time 12   ? Period Weeks   ? Status On-going   ? Target Date 03/31/22   ?  ? OT LONG TERM GOAL #3  ? Title Pt. will require minA to stand and hike pants   ? Baseline Eval: MaxA, 10th visit: ModA 01/06/2022: ModA   ? Time 12   ? Period Weeks   ? Target Date 03/31/22   ?  ? OT LONG TERM GOAL #4  ? Title Pt. will perform LE dressing with minA   ? Baseline Eval: MaxA 10th visit: Pt. requires MaxA for donning her brace, shoes, and socks. ModA with pants. 01/06/2022: ModA pants, MaxA shoes, socks, and brace   ? Time 12   ? Period Weeks   ? Status On-going   ? Target Date 01/21/22   ?  ? OT LONG TERM GOAL #5  ? Title Pt. will improve FOTO score by 2 pt. to reflect funcational change   ? Baseline Eval: FOTO score: 12, 10th visit: FOTO score 27 01/06/2022: 27   ? Time 12   ? Period Weeks   ? Status On-going   ? Target Date 03/31/22   ?  ? OT LONG TERM GOAL #6  ? Title Patient will transfer to bedside commode or toilet with supervision.   ? Baseline 01/06/2022: min-ModA   ? Time  4   ? Period Weeks   ? Status On-going   ? Target Date 03/31/22   ? ?  ?  ? ?  ? ? ? ? ? ? ? ? Plan - 02/10/22 1736   ? ? Clinical Impression Statement Pt. presents with  flexor tone, and tightness. Pt. Continues to respond well to the session with less tone, and tightness noted proximally following ROM, and manual therapy. Pt. presented with increased tightness with right shoulder abduction initially, however tolerated increased right shoulder abduction today. Reviewed positioning for increased shoulder abduction in sitting with the pt. And her grandson.  Pt. continues to work on normalizing tone, and facilitating active volitional movement in the RUE, and hand to improve engagement of the RUE during ADLs, as well as improve transfers, and functional mobility for toileting  ?  ?   ? OT Occupational Profile and History Detailed Assessment- Review of Records and additional review of physical, cognitive, psychosocial history related to current functional performance   ? Occupational performance deficits (Please refer to evaluation for details): Leisure;IADL's;ADL's   ? Body Structure / Function / Physical Skills ADL;IADL;FMC;ROM;UE functional use;Strength;Decreased knowledge of use of DME;Coordination   ? Rehab Potential Fair   ? Clinical Decision Making Several treatment options, min-mod task modification necessary   ? Comorbidities Affecting Occupational Performance: May have comorbidities impacting occupational performance   ? Modification or Assistance to Complete Evaluation  Min-Moderate modification of tasks or assist with assess necessary to complete eval   ? OT Frequency 2x / week   ? OT Duration 12 weeks   ? OT Treatment/Interventions Self-care/ADL training;Neuromuscular education;Electrical Stimulation;Patient/family education;Therapeutic activities;Passive range of motion;Therapist, nutritional;Therapeutic exercise;DME and/or AE instruction;Moist Heat   ? Consulted and Agree with Plan of Care Patient    ? ?  ?  ? ?  ? ? ?Patient will benefit from skilled therapeutic intervention in order to improve the following deficits and impairments:   ?Body Structure / Function / Physical Skills: ADL, IADL, FM

## 2022-02-14 ENCOUNTER — Other Ambulatory Visit: Payer: Self-pay | Admitting: Physical Medicine and Rehabilitation

## 2022-02-15 ENCOUNTER — Encounter: Payer: Self-pay | Admitting: Occupational Therapy

## 2022-02-15 ENCOUNTER — Ambulatory Visit: Payer: Medicare Other | Admitting: Speech Pathology

## 2022-02-15 ENCOUNTER — Encounter: Payer: Self-pay | Admitting: Physical Therapy

## 2022-02-15 ENCOUNTER — Ambulatory Visit: Payer: Medicare Other | Admitting: Physical Therapy

## 2022-02-15 ENCOUNTER — Ambulatory Visit: Payer: Medicare Other | Admitting: Occupational Therapy

## 2022-02-15 DIAGNOSIS — R4701 Aphasia: Secondary | ICD-10-CM

## 2022-02-15 DIAGNOSIS — R269 Unspecified abnormalities of gait and mobility: Secondary | ICD-10-CM

## 2022-02-15 DIAGNOSIS — R482 Apraxia: Secondary | ICD-10-CM

## 2022-02-15 DIAGNOSIS — R2689 Other abnormalities of gait and mobility: Secondary | ICD-10-CM

## 2022-02-15 DIAGNOSIS — R278 Other lack of coordination: Secondary | ICD-10-CM

## 2022-02-15 DIAGNOSIS — M6281 Muscle weakness (generalized): Secondary | ICD-10-CM

## 2022-02-15 DIAGNOSIS — R2681 Unsteadiness on feet: Secondary | ICD-10-CM

## 2022-02-15 NOTE — Therapy (Signed)
[facility] ?[place], [place], [address] ?Phone: [phone]   Fax:  [phone] ? ?Physical Therapy Treatment ? ?Patient Details  ?Name: Lindsey [name] ?MRN: [id number] ?Date of Birth: 09/07/1956 ?Referring Provider (PT): Raulkar, Clide Deutscher, MD ? ? ?Encounter Date: 02/15/2022 ? ? PT End of Session - 02/15/22 0927   ? ? Visit Number 19   ? Number of Visits 24   ? Date for PT Re-Evaluation 05/03/22   ? Authorization Time Period Cert through [id number]; Recert [id number]   ? Progress Note Due on Visit 20   ? PT Start Time [id number]   ? PT Stop Time 1014   ? PT Time Calculation (min) 41 min   ? Equipment Utilized During Treatment Gait belt   ? Activity Tolerance Patient limited by fatigue   ? Behavior During Therapy [facility] for tasks assessed/performed   ? ?  ?  ? ?  ? ? ?Past Medical History:  ?Diagnosis Date  ? Aphasia   ? 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 CVA (cerebral vascular accident) [facility])   ? Diverticulosis   ? History of ischemic left MCA stroke   ? Hypertension   ? Pain due to onychomycosis of toenails of both feet   ? 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 Uterine prolapse   ? ? ?History reviewed. No pertinent surgical history. ? ?There were no vitals filed for this visit. ? ? Subjective Assessment - 02/15/22 0927   ? ? Subjective Pt reports she is doing well, is not tired or fatigued.   ? Patient is accompained by: Family member   [name]  ? Pertinent History Pt was doing very well and was walking short distances wqith a cane prior to second stroke in december. At this point she is limited to wheelchair as her main means of mobility. Pt caregiver reports all detailed information. Pt has hesitation with the left lower extremity and significant weakness on the right lower extremity. Pt will take 2-3 steps when transfering. Pt caregiver assists with all transfers.Pt has shower with ledge to  steps over ledge when entering and utilizes bedside commode. Pt has 2 level home with chair lift for upstairs access. Pt would like to improve ransfers, improve neglect on her R side and improve her overall strength and mobility.Pt. is a 66 y.o. 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 Pt. has Right sided weakness, and receptive, and expressive aphasia. Pt. has had a previous CVA with right sided weakness in July 2021. PMHx includes: AFib, Renal Artery thrombosis, situational depression, HTN, Pyelnephritis, Seizure activity, COnstipation, AKI. Vitamin D deficiency, and urinary incontinence.   ? Limitations Standing;Walking;House hold activities   ? How long can you sit comfortably? n/a   ? How long can you stand comfortably? 2 min   ? How long can you walk comfortably? 3-4 steps   ? Patient Stated Goals Improve balance and functional movements   ? ?  ?  ? ?  ? ?INTERVENTIONS ?Therex  ?Seated LE strengthening: ?- Assisted seated march on right/AROM on Left x 15 reps  ?- Assisted seated knee ext on right/AROM on left x 15 reps ?- Seated hip add with ball - 5 sec hold x 12 reps ? ? ?Sit to stand- Physical assist to flex right knee and keep right foot in position using LUE for  push up x 10 reps.  ? ?Heel to toe sequencing exercise- placed 2 cones on floor and instructed patient to advance Leg forward for heel strike then back into Toe off - x 15 reps on Right then 15 more reps on left.  ? ? ?Gait training:  ? ?Ambulation in parallel bars x 1 lap, cues for step pattern but pt had difficulty controlling step width whicm made balance and progressing involved LE on following steps.  ?Practiced with taking step to green theraband pad for external cue for adequate step length   ?2*5 reps  ? ?Ambulated // bars following last activity and pt demonstrated improved foot palcement and weight shift to unload involved LE to improve step length.  ? ?Patient  ambulated approx 760 feet using hemiwalker on left side, navigating obstacles in gym.  CGA with max VC for gait sequencing- Step to gait with minimal step length and difficulty sequencing.  ?  ? ?Education provided throughout session via VC/TC and demonstration to facilitate movement at target joints and correct muscle activation for all testing and exercises performed.  ? ?Pt educated throughout session about proper posture and technique with exercises. Improved exercise technique, movement at target joints, use of target muscles after min to mod verbal, visual, tactile cues. ? ? ? ? ? ? ? ? ? ? ? ? ? ? ? ? ? ? ? ? ? ? ? ? ? ? ? ? ? ? PT Education - 02/15/22 1242   ? ? Education Details exercise technique   ? Person(s) Educated Patient   ? Methods Explanation;Demonstration   ? Comprehension Verbalized understanding;Returned demonstration   ? ?  ?  ? ?  ? ? ? PT Short Term Goals - 02/08/22 2210   ? ?  ? PT SHORT TERM GOAL #1  ? Title Patient will be independent in home exercise program to improve strength/mobility for better functional independence with ADLs.   ? Baseline no formal HEp for LE/balance at this time; 02/08/2022=Patient continues to require VC for reminders and assist with hip march/knee ext (seated for strengthening); Reminders to continue to work on static stand at home.   ? Time 4   ? Period Weeks   ? Status New   ? Target Date 12/14/21   ? ?  ?  ? ?  ? ? ? ? PT Long Term Goals - 02/08/22 2212   ? ?  ? PT LONG TERM GOAL #1  ? Title Patient will increase FOTO score to equal to or greater than   41  to demonstrate statistically significant improvement in mobility and quality of life.   ? Baseline 16 on 2/14; 02/08/2022=Patient unable to complete today- No family present but will attempt again next visit available with family present to assist in completion.   ? Time 12   ? Period Weeks   ? Status On-going   ? Target Date 05/03/22   ?  ? PT LONG TERM GOAL #3  ? Title Patient  will complete five times sit to  stand test in < 30 seconds with UE assistance indicating an increased LE strength and improved balance.   ? Baseline 58.47 s with UE assist and with A from PT for R LE positioning; 02/08/2022=54 sec with use of L UE support and Min physical assist to position right LE.   ? Time 12   ? Period Weeks   ? Status New   ? Target Date 05/03/22   ?  ?  PT LONG TERM GOAL #4  ? Title Patient will increase Berg Balance score by > 6 points to demonstrate decreased fall risk during functional activities.   ? Baseline 12 on 2/14; 02/08/2022= 13/56   ? Time 12   ? Period Weeks   ? Status On-going   ? Target Date 05/03/22   ?  ? PT LONG TERM GOAL #5  ? Title Pt will transfer from seated position on table/chair to/from transport chair/WC in order to indicate improved transfer ability.   ? Baseline Requires Min A for LE positioning and to prevent LOB. 02/08/2022=Patient attempted to perform on her own- Unable to push herself up from chair- Very min assist to position right LE into more flexed position and VC for hand placement. Patient able to stand with min Assist with use of gait belt and VC to lean   ? Time 12   ? Period Weeks   ? Status On-going   ? Target Date 05/03/22   ?  ? Additional Long Term Goals  ? Additional Long Term Goals Yes   ?  ? PT LONG TERM GOAL #6  ? Title Patient will ambulate > 150 feet with CGA using Hemiwalker on level surfaces for improved household and short community distances.   ? Baseline 02/08/2022= 40 feet with use of HW, heavy CGA with max VC for gait sequencing and w/c follow   ? Time 12   ? Period Weeks   ? Status New   ? Target Date 05/03/22   ?  ? PT LONG TERM GOAL #7  ? Title Pt will increase 10MWT by at least 0.13 m/s in order to demonstrate clinically significant improvement in community ambulation.   ? Baseline 02/08/2022= 0.05 m/s using HW   ? Time 12   ? Period Weeks   ? Status New   ? Target Date 05/03/22   ? ?  ?  ? ?  ? ? ? ? ? ? ? ? Plan - 02/15/22 1247   ? ? Clinical Impression Statement Patient  presents with good motivation for completion of physical therapy activities.  Patient was able to progress with ambulation in parallel bars with improved step pattern following gait training activities

## 2022-02-15 NOTE — Therapy (Signed)
[place] [facility] ?[place], [place], [address] ?Phone: [phone]   Fax:  [phone] ? ?Occupational Therapy Treatment ? ?Patient Details  ?Name: Lindsey [name] ?MRN: [id number] ?Date of Birth: 06/08/1956 ?Referring Provider (OT): Dr. Ranell Patrick ? ? ?Encounter Date: 02/15/2022 ? ? OT End of Session - 02/15/22 1126   ? ? Visit Number 29   ? Number of Visits 48   ? Date for OT Re-Evaluation 03/31/22   ? Authorization Type Progress report period starting 12/02/2021   ? OT Start Time 1015   ? OT Stop Time 1100   ? OT Time Calculation (min) 45 min   ? Activity Tolerance Patient tolerated treatment well   ? Behavior During Therapy [facility] for tasks assessed/performed   ? ?  ?  ? ?  ? ? ?Past Medical History:  ?Diagnosis Date  ? Aphasia   ? 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 CVA (cerebral vascular accident) [facility])   ? Diverticulosis   ? History of ischemic left MCA stroke   ? Hypertension   ? Pain due to onychomycosis of toenails of both feet   ? 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 Uterine prolapse   ? ? ?History reviewed. No pertinent surgical history. ? ?There were no vitals filed for this visit. ? ? Subjective Assessment - 02/15/22 1126   ? ? Subjective  Pt reports she's feeling fine today.   ? Patient is accompanied by: Family member   ? Pertinent History Pt. is a 66 y.o. 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 Pt. has Right sided weakness, and receptive, and expressive aphasia. Pt. has had a previous CVA with right sided weakness in July 2021. PMHx includes: AFib, Renal Artery thrombosis, situational depression, HTN, Pyelnephritis, Seizure activity, COnstipation, AKI. Vitamin D deficiency, and urinary incontinence/   ? Currently in Pain? No/denies   ? ?  ?  ? ?  ? ? ?OT TREATMENT   ?  ?Manual Therapy: ?  ?Pt. Tolerated soft  tissue massage to the right scapular musculature. Pt. tolerated scapular mobilizations in elevation, depression, abduction, and rotation following moist heat modality to the right shoulder. Pt. Tolerated soft tissue mobilizations for radius on ulna, and metacarpal spread stretches to decrease tightness, and prepare the UE, and hand for ROM and therapeutic Ex. Pt. Worked on BUE strengthening, and reciprocal motion using the UBE while seated for 8 min. with no resistance. Constant monitoring was provided, along with support, and an ACE wrap in place to the right hand. ?  ?Therapeutic Exercise: ?  ?Pt. tolerated PROM through all joint ranges of the RUE, and hand. Pt. performed self-ROM to the right MPs, PIPs, and DIPs. Pt. Worked on AAROM for right shoulder flexion at the tabletop with left hand over hand assist. Pt. worked on [employer], and reciprocal motion using the UBE while seated for 5 min. with no resistance. Constant monitoring was provided, along with support, and an ACE wrap in place to the right hand. ?  ?Pt. presents with flexor tone, and tightness. Pt. Continues to respond well to the session with less tone, and tightness noted proximally following ROM, and manual therapy. Pt. presented with increased tightness with right shoulder abduction, and flexion. Pt. continues to work on normalizing tone, and facilitating active volitional movement in the RUE, and hand to improve engagement of  the RUE during ADLs, as well as improve transfers, and functional mobility for toileting  ?  ?  ? ? ? ? ? ? ? ? ? ? ? ? ? ? ? ? ? ? ? ? OT Education - 02/15/22 1126   ? ? Education Details RUE functioning   ? Person(s) Educated Patient   ? Methods Explanation;Verbal cues;Tactile cues;Demonstration   ? Comprehension Verbal cues required;Need further instruction;Returned demonstration;Tactile cues required;Verbalized understanding   ? ?  ?  ? ?  ? ? ? OT Short Term Goals - 01/06/22 1117   ? ?  ? OT SHORT TERM GOAL #1  ?  Title Pt and caregiver will be independent with HEP for RUE   ? Baseline Eval: No current program. 10th visit: Caregivers  report being comfortable with exercises at home. 01/06/2022: Continue ongoing HEPs for UE   ? Time 6   ? Period Weeks   ? Status On-going   ? Target Date 02/17/22   ? ?  ?  ? ?  ? ? ? ? OT Long Term Goals - 01/06/22 1120   ? ?  ? OT LONG TERM GOAL #1  ? Title Pt. will engage engage the RUE as a gross assist/atabilizer during ADLs 100% of the time   ? Baseline 01/06/2022: Pt. is starting to engage her RUE as a gross stabiliizer with increased cues. Eval: pt. does not currently engage her RUE during ADLs. 10th visit: Pt. continues to need to work towards using her RUE as a gross stabilizer.   ? Time 12   ? Period Weeks   ? Status On-going   ? Target Date 03/31/22   ?  ? OT LONG TERM GOAL #2  ? Title Pt. will perform UE dressing using one armed dressing techniques   ? Baseline Eval: MaxA, 10th visit: ModA 01/06/2022: min-modA   ? Time 12   ? Period Weeks   ? Status On-going   ? Target Date 03/31/22   ?  ? OT LONG TERM GOAL #3  ? Title Pt. will require minA to stand and hike pants   ? Baseline Eval: MaxA, 10th visit: ModA 01/06/2022: ModA   ? Time 12   ? Period Weeks   ? Target Date 03/31/22   ?  ? OT LONG TERM GOAL #4  ? Title Pt. will perform LE dressing with minA   ? Baseline Eval: MaxA 10th visit: Pt. requires MaxA for donning her brace, shoes, and socks. ModA with pants. 01/06/2022: ModA pants, MaxA shoes, socks, and brace   ? Time 12   ? Period Weeks   ? Status On-going   ? Target Date 01/21/22   ?  ? OT LONG TERM GOAL #5  ? Title Pt. will improve FOTO score by 2 pt. to reflect funcational change   ? Baseline Eval: FOTO score: 12, 10th visit: FOTO score 27 01/06/2022: 27   ? Time 12   ? Period Weeks   ? Status On-going   ? Target Date 03/31/22   ?  ? OT LONG TERM GOAL #6  ? Title Patient will transfer to bedside commode or toilet with supervision.   ? Baseline 01/06/2022: min-ModA   ? Time 4   ?  Period Weeks   ? Status On-going   ? Target Date 03/31/22   ? ?  ?  ? ?  ? ? ? ? ? ? ? ? Plan - 02/15/22 1127   ? ? Clinical Impression  Statement Pt. presents with flexor tone, and tightness. Pt. Continues to respond well to the session with less tone, and tightness noted proximally following ROM, and manual therapy. Pt. presented with increased tightness with right shoulder abduction, and flexion. Pt. continues to work on normalizing tone, and facilitating active volitional movement in the RUE, and hand to improve engagement of the RUE during ADLs, as well as improve transfers, and functional mobility for toileting.  ? OT Occupational Profile and History Detailed Assessment- Review of Records and additional review of physical, cognitive, psychosocial history related to current functional performance   ? Occupational performance deficits (Please refer to evaluation for details): Leisure;IADL's;ADL's   ? Body Structure / Function / Physical Skills ADL;IADL;FMC;ROM;UE functional use;Strength;Decreased knowledge of use of DME;Coordination   ? Rehab Potential Fair   ? Clinical Decision Making Several treatment options, min-mod task modification necessary   ? Comorbidities Affecting Occupational Performance: May have comorbidities impacting occupational performance   ? Modification or Assistance to Complete Evaluation  Min-Moderate modification of tasks or assist with assess necessary to complete eval   ? OT Frequency 2x / week   ? OT Duration 12 weeks   ? OT Treatment/Interventions Self-care/ADL training;Neuromuscular education;Electrical Stimulation;Patient/family education;Therapeutic activities;Passive range of motion;Therapist, nutritional;Therapeutic exercise;DME and/or AE instruction;Moist Heat   ? Consulted and Agree with Plan of Care Patient   ? ?  ?  ? ?  ? ? ?Patient will benefit from skilled therapeutic intervention in order to improve the following deficits and impairments:   ?Body Structure / Function  / Physical Skills: ADL, IADL, FMC, ROM, UE functional use, Strength, Decreased knowledge of use of DME, Coordination ?  ?  ? ? ?Visit Diagnosis: ?Muscle weakness (generalized) ? ?Other lack of coordination

## 2022-02-15 NOTE — Therapy (Signed)
[facility] ?[place], [place], [address] ?Phone: [phone]   Fax:  [phone] ? ?Speech Language Pathology Treatment ? ?Patient Details  ?Name: Lindsey [name] ?MRN: [id number] ?Date of Birth: Jul 11, 1956 ?Referring Provider (SLP): Dr. Ranell Patrick ? ? ?Encounter Date: 02/15/2022 ? ? End of Session - 02/15/22 1209   ? ? Visit Number 23   ? Number of Visits 25   ? Date for SLP Re-Evaluation 03/10/22   ? Authorization Type Medicare/ medicaid secondary   ? SLP Start Time 1100   ? SLP Stop Time  1155   ? SLP Time Calculation (min) 55 min   ? Activity Tolerance Patient tolerated treatment well   ? ?  ?  ? ?  ? ? ?Past Medical History:  ?Diagnosis Date  ? Aphasia   ? 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 CVA (cerebral vascular accident) [facility])   ? Diverticulosis   ? History of ischemic left MCA stroke   ? Hypertension   ? Pain due to onychomycosis of toenails of both feet   ? 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 Uterine prolapse   ? ? ?No past surgical history on file. ? ?There were no vitals filed for this visit. ? ? Subjective Assessment - 02/15/22 1200   ? ? Subjective Pt arrives with [name].   ? Patient is accompained by: Family member   [name]  ? Currently in Pain? No/denies   ? ?  ?  ? ?  ? ? ? ? ? ? ? ? ADULT SLP TREATMENT - 02/15/22 1201   ? ?  ? General Information  ? Behavior/Cognition Alert;Cooperative;Pleasant mood   ? HPI Patient is a 66 y.o. female with prior history of CVA in 2021 with residual aphasia (using Lingraphica AAC device to assist with communication as of 08/2021) referred by Dr. Ranell Patrick after recent hospital admission when she was found to have new L AICA infarct complicated by pyelonephritis. Admitted to [facility] and then CIR 12/18-12/30/22. MRI 09/22/21 showed acute moderate size L AICA cerebellar infarct, underlying extensive chronic left MCA encephalomalacia related to  2021 infarct, left brainstem Wallerian degeneration and chronic left PICA cerebellar infarcts.   ?  ? Treatment Provided  ? Treatment provided Cognitive-Linquistic   ?  ? Cognitive-Linquistic Treatment  ? Treatment focused on Aphasia;Apraxia   ? Skilled Treatment Skilled ST session focused on LTGs of caregiver training. Yolanda Bonine [name] present with pt and participated in session. [name] reports using device with pt, "Prett much if we don't understand something she says." SLP explained rationale for using instances when he DOES understand patient as opportunities for training with the device. Encouraged to reveal competence (I understand what you mean. Did you know there is a way for you to show me on your device?).  Demonstrated partner-assisted scanning with common scenarios (pt wanting to change clothes, sit outside, clean glasses, etc).  Demonstrated how to edit icons (images, text, audio recording) and caregiver able to return demonstration with verbal instructions. Demonstrated use of supported conversation with visual aid (feelings handout). Pt selected "Thumbs up" for general feelings and with partner-assisted scanning, selected "Happy" from a list of "thumbs up" feelings.   ?  ? Assessment / Recommendations / Plan  ? Plan Continue with current plan of care   ?  ? Progression Toward Goals  ? Progression toward goals Progressing toward goals   ? ?  ?  ? ?  ? ? ?  SLP Education - 02/15/22 1207   ? ? Education Details partner-assisted scanning; practice using the device in absence of a communication breakdown to familiarize use as a communication tool   ? Person(s) Educated Patient;Other (comment)   grandson  ? Methods Explanation;Demonstration   ? Comprehension Verbalized understanding   ? ?  ?  ? ?  ? ? ? SLP Short Term Goals - 12/13/21 0924   ? ?  ? SLP SHORT TERM GOAL #1  ? Title Pt will answer personally relevant Y/N questions in context using multimodal communication or AAC if necessary, 75% accuracy.   ?  Time 10   ? Period --   sessions  ? Status Achieved   ?  ? SLP SHORT TERM GOAL #2  ? Title Given verbal or visual prompt, pt will correctly ID written word/object/icon in field of 4-6 to communicate wants/needs with 75% accuracy given occasional mod A over 2 sessions   ? Baseline F:3 12/09/21   ? Time 10   ? Period --   visits  ? Status Not Met   ?  ? SLP SHORT TERM GOAL #3  ? Title Pt will verbally approximate 3 significant words for 5/10 trials given occasional fading to rare mod A over 2 sessions   ? Time 10   ? Period --   sessions  ? Status Achieved   ? ?  ?  ? ?  ? ? ? SLP Long Term Goals - 01/27/22 0836   ? ?  ? SLP LONG TERM GOAL #1  ? Title Pt will use multimodal communication system with mod assist to express wants/needs given >2 choices over 2 sessions for 4/5 opportunities   ? Time 12   ? Period Weeks   ? Status Achieved   ?  ? SLP LONG TERM GOAL #2  ? Title Caregiver will demonstrate appropriate use of supported conversation and multimodal supports to aid pt's comprehension and expression of personally relevant topics, over 4 sessions.   ? Time 6   ? Period Weeks   ? Status Revised   ? Target Date 03/10/22   ?  ? SLP LONG TERM GOAL #3  ? Title Caregiver will appropriately cue patient when apraxic/aphasic errors occurs for 4/5 opportunities over 2 sessions   ? Time 6   ? Period Weeks   ? Status On-going   ? Target Date 03/10/22   ?  ? SLP LONG TERM GOAL #4  ? Title Caregiver will demonstrate ability to modify Lingraphica device with modified independence over 3 sessions.   ? Time 6   ? Period Weeks   ? Status New   ? Target Date 03/10/22   ? ?  ?  ? ?  ? ? ? Plan - 02/15/22 1209   ? ? Clinical Impression Statement Lindsey [name] presents with severe aphasia and severe verbal apraxia, chronic deficits exacerbated by new CVA in December 2022. Pt continues to increase usuage of spontaneous speech; noted pt using spontaneous speech for simple responses throughout session today.Grandson took active role in programming  icons and demonstrated ability to adjust device to pt needs with min cues today. Pt accuracy with making selections in role-play scenarios improves with use of partner assisted scanning, which was trained today. Continue skilled ST with focus on functional communication and caregiver training to improve ability to communicate wants/needs, reduce frustration/social isolation and improve quality of life. Anticipate d/c in next 2-3 sessions   ? Speech Therapy Frequency 2x / week   ?  Duration 12 weeks   ? Treatment/Interventions Compensatory strategies;Patient/family education;Functional tasks;Cueing hierarchy;Environmental controls;Cognitive reorganization;Multimodal communcation approach;Language facilitation;Compensatory techniques;Internal/external aids;SLP instruction and feedback;Other (comment)   ? Potential to [place]   ? Potential Considerations Severity of impairments;Previous level of function   ? Consulted and Agree with Plan of Care Patient;Family member/caregiver   ? Family Member Consulted daughter   ? ?  ?  ? ?  ? ? ?Patient will benefit from skilled therapeutic intervention in order to improve the following deficits and impairments:   ?Aphasia ? ?Verbal apraxia ? ? ? ?Problem List ?Patient Active Problem List  ? Diagnosis Date Noted  ? Blood clotting disorder ([place]) 12/27/2021  ? Elevated AST (SGOT) 10/22/2021  ? 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 Cerebral embolism with cerebral infarction 09/23/2021  ? Pyelonephritis 09/19/2021  ? Pain due to onychomycosis of toenails of both feet 11/19/2020  ? Normocytic anemia 05/31/2020  ? Acute ischemic left MCA stroke ([place]) 04/30/2020  ? Right hemiplegia ([place]) 04/30/2020  ? Cerebrovascular accident [facility]) 04/21/2020   ? Atrial fibrillation with RVR ([place]) 04/21/2020  ? Essential hypertension 04/21/2020  ? Alcohol abuse 04/21/2020  ? ?Deneise Lever, MS, CCC-SLP ?Speech-Language Pathologist ?[phone] ? ?[facility]

## 2022-02-15 NOTE — Patient Instructions (Addendum)
Hi [name], ? ?Thank you for taking all of those photos! They work really well on the device. A picture of Kaelei's clothes or her opened closet might be a good picture to take for [place] to change the icon for "Change my clothes." ? ?Our last couple of sessions we will continue to focus on making sure you guys feel comfortable locating things on the device and helping Jules scan for and make selections. Our last scheduled appointment is 6/6.  ? ?Today I showed [name] how to do "partner assisted scanning." This means that if Malaiyah is having trouble locating something on her device, you can scroll for her and read each option out loud and give her the opportunity to answer yes or no. I also reviewed with him how to edit icons.  ? ?We talked today about how to give Kanita a little more practice using the device during times when she is not frustrated. For example, if she points or gestures and you are able to "guess" what she needs, you can then use that opportunity to say, "Oh! I know what you are talking about. Did you know there is a way you can show me that on your device?" And then help her to find that selection. This will help both of you gain familiarity and practice with using the device as a communication tool.  ? ?I am sending a handout that can be used to talk about feelings. If you think something similar would be helpful on Daily's device I can work on this.  ? ?Feel free to send me an email with any questions or issues you would like me to address at our next session.  ? ?Thanks for all your work! ? ?Shon Hale ?

## 2022-02-17 ENCOUNTER — Encounter: Payer: Self-pay | Admitting: Occupational Therapy

## 2022-02-17 ENCOUNTER — Ambulatory Visit: Payer: Medicare Other | Admitting: Occupational Therapy

## 2022-02-17 ENCOUNTER — Ambulatory Visit: Payer: Medicare Other | Admitting: Speech Pathology

## 2022-02-17 ENCOUNTER — Ambulatory Visit: Payer: Medicare Other

## 2022-02-17 DIAGNOSIS — R269 Unspecified abnormalities of gait and mobility: Secondary | ICD-10-CM

## 2022-02-17 DIAGNOSIS — R2681 Unsteadiness on feet: Secondary | ICD-10-CM

## 2022-02-17 DIAGNOSIS — M6281 Muscle weakness (generalized): Secondary | ICD-10-CM

## 2022-02-17 DIAGNOSIS — R278 Other lack of coordination: Secondary | ICD-10-CM

## 2022-02-17 DIAGNOSIS — R2689 Other abnormalities of gait and mobility: Secondary | ICD-10-CM

## 2022-02-17 NOTE — Therapy (Signed)
[facility] [address] [place], [place], [address] Phone: [phone]   Fax:  [phone]  Occupational Therapy Progress Note  Dates of reporting period  01/06/2022   to   02/17/2022   Patient Details  Name: Lindsey [name] MRN: [id number] Date of Birth: Mar 31, 1956 Referring Provider (OT): Dr. 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 No pertinent surgical history.  There were no vitals filed for this visit.   Subjective Assessment - 02/17/22 1030     Subjective  Pt. left the communication device at home    Patient is accompanied by: Family member    Pertinent History Pt. is a 66 y.o. 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 Pt. has Right sided weakness, and receptive, and expressive aphasia. Pt. has had a previous CVA with right sided weakness in July 2021. PMHx includes: AFib, Renal Artery thrombosis, situational depression, HTN, Pyelnephritis, Seizure activity, COnstipation, AKI. Vitamin D deficiency, and urinary incontinence/    Currently in Pain? No/denies                 [facility] OT Assessment - 02/17/22 1035       PROM   Overall PROM Comments Right shoulder flexion 0(105), abduction: 0(102), Elbow 0(0-136), wrist flexion: 0(78), wrist extension -40(72)                              OT Education - 02/17/22 1031     Education Details RUE functioning    Person(s) Educated Patient    Methods Explanation;Verbal cues;Tactile cues;Demonstration    Comprehension Verbal cues required;Need further instruction;Returned demonstration;Tactile cues required;Verbalized understanding              OT Short Term Goals - 02/17/22 1032       OT SHORT TERM GOAL #1   Title Pt and caregiver will be independent with HEP for RUE    Baseline Eval: No current program. 10th visit: Caregivers  report being comfortable with exercises at home. 01/06/2022: Continue ongoing HEPs for UE. 30th visit: Pt./caregivers to continue with ROM    Time 6    Period Weeks    Status On-going    Target Date 03/31/22               OT Long Term Goals - 02/17/22 1046  OT LONG TERM GOAL #1   Title Pt. will engage engage the RUE as a gross assist/atabilizer during ADLs 100% of the time    Baseline 30th visit: Pt. is initiating using her RUE as a gross stabilizer with maxA 01/06/2022: Pt. is starting to engage her RUE as a gross stabiliizer with increased cues. Eval: pt. does not currently engage her RUE during ADLs. 10th visit: Pt. continues to need to work towards using her RUE as a gross stabilizer.    Time 12    Status On-going    Target Date 03/31/22      OT LONG TERM GOAL #2   Title Pt. will perform UE dressing using one armed dressing techniques    Baseline Eval: MaxA, 10th visit: [place] 01/06/2022: min-modA 30th visit; min0-modA    Time 12    Period Weeks    Status On-going    Target Date 03/31/22      OT LONG TERM GOAL #3   Title Pt. will require minA to stand and hike pants    Baseline Eval: MaxA, 10th visit: [place] 01/06/2022: [place] 30th visit:  ModA    Time 12    Period Weeks    Status On-going    Target Date 03/31/22      OT LONG TERM GOAL #4   Title Pt. will perform LE dressing with minA    Baseline Eval: MaxA 10th visit: Pt. requires MaxA for donning her brace, shoes, and socks. ModA with pants. 01/06/2022: ModA pants, MaxA shoes, socks, and brace 30th visit: ModA pants, MaxA shoes, socks, and brace.    Time 12    Period Weeks    Status On-going    Target Date 03/31/22      OT LONG TERM GOAL #5   Title Pt. will improve FOTO score by 2 pt. to reflect funcational change    Baseline Eval: FOTO score: 12, 10th visit: FOTO score 27 01/06/2022: 27 30th visit TBA at next visit secondary to no family available during the session.    Time 12    Period Weeks    Status On-going    Target Date 03/31/22      OT LONG TERM GOAL #6   Title Patient will transfer to bedside commode or toilet with supervision.    Baseline 30th visit: min-modA. 01/06/2022: min-ModA    Time 4    Period Weeks    Status On-going    Target Date 03/31/22           OT TREATMENT     Manual Therapy:   Pt. Tolerated soft tissue massage to the right scapular musculature. Pt. tolerated scapular mobilizations in elevation, depression, abduction, and rotation following moist heat modality to the right shoulder. Pt. Tolerated soft tissue mobilizations for radius on ulna, and metacarpal spread stretches to decrease tightness, and prepare the UE, and hand for ROM and therapeutic Ex.   Therapeutic Exercise:   Pt. tolerated PROM through all joint ranges of the RUE, and hand. Pt. performed self-ROM to the right MPs, PIPs, and DIPs. Pt. Worked on AAROM for right shoulder flexion at the tabletop with left hand over hand assist.    Measurements were obtained, and goals were reviewed with the pt. FOTO assessment to be administered next visit when family is available. Pt. Has made progress with PROM for right shoulder flexion, and abduction, and AROM in wrist extension.  Pt. Continues to progress with daily self care skills requiring less assistance than at the initial  eval. Pt. presents with flexor tone, and tightness. Pt. Continues to respond well to the session with less tone, and tightness noted proximally following ROM, and manual therapy. Pt. presented with increased tightness with right shoulder abduction, and flexion. Pt. continues to work on normalizing tone, and facilitating active volitional movement in the RUE, and hand to improve engagement of the RUE during ADLs, as well as improve transfers, and functional mobility for toileting.          Plan - 02/17/22 1032     Clinical Impression Statement Measurements were obtained, and goals were reviewed with the pt. FOTO assessment to be administered next visit when family is available. Pt. Has made progress with PROM for right shoulder flexion, and abduction, and AROM in wrist extension. Pt. Continues to progress with daily self care skills requiring less assistance than at the initial eval. Pt. presents with flexor tone, and tightness. Pt. Continues to respond well to the session with less tone, and tightness noted proximally following ROM, and manual therapy. Pt. presented with increased tightness with right shoulder abduction, and flexion. Pt. continues to work on normalizing tone, and facilitating active volitional movement in the RUE, and hand to improve engagement of the RUE during ADLs, as well as improve transfers, and functional mobility for toileting.      OT Occupational Profile and History Detailed Assessment- Review of Records and additional review of physical, cognitive, psychosocial history related to current functional performance    Occupational performance deficits (Please refer to evaluation for details): Leisure;IADL's;ADL's    Body Structure / Function / Physical Skills ADL;IADL;FMC;ROM;UE functional use;Strength;Decreased knowledge of use of DME;Coordination    Rehab Potential Fair     Clinical Decision Making Several treatment options, min-mod task modification necessary    Comorbidities Affecting Occupational Performance: May have comorbidities impacting occupational performance    Modification or Assistance to Complete Evaluation  Min-Moderate modification of tasks or assist with assess necessary to complete eval    OT Frequency 2x / week    OT Duration 12 weeks    OT Treatment/Interventions Self-care/ADL training;Neuromuscular education;Electrical Stimulation;Patient/family education;Therapeutic activities;Passive range of motion;Therapist, nutritional;Therapeutic exercise;DME and/or AE instruction;Moist Heat    Consulted and Agree with Plan of Care Patient             Patient will benefit from skilled therapeutic intervention in order to improve the following deficits and impairments:   Body Structure / Function / Physical Skills: ADL, IADL, FMC, ROM, UE functional use, Strength, Decreased knowledge of use of DME, Coordination       Visit Diagnosis: Muscle weakness (generalized)    Problem List Patient Active Problem List   Diagnosis Date Noted   Blood clotting disorder ([place]) 12/27/2021   Elevated AST (SGOT) 10/22/2021   Lupus anticoagulant positive 10/22/2021   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)    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[place])    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 ([place]) 09/24/2021   Cerebral embolism with cerebral infarction 09/23/2021   Pyelonephritis 09/19/2021   Pain due to onychomycosis of toenails of both feet 11/19/2020   Normocytic anemia 05/31/2020   Acute ischemic left MCA stroke ([place]) 04/30/2020   Right hemiplegia ([place]) 04/30/2020   Cerebrovascular accident ([place]) 04/21/2020   Atrial fibrillation with RVR ([place]) 04/21/2020   Essential hypertension 04/21/2020   Alcohol abuse 04/21/2020   Harrel Carina, MS, OTR/L  Harrel Carina, OT 02/17/2022, 10:52 AM  [place] [facility] [address] [place], [place], [address] Phone: [phone]   Fax:  [phone]  Name: Mccayla Kellough MRN: [id number] Date of Birth: 1956-05-20

## 2022-02-17 NOTE — Therapy (Signed)
[place] MAIN [facility] SERVICES [address] [place], [place], [address] Phone: [phone]   Fax:  [phone]  Physical Therapy Treatment/Physical Therapy Progress Note   Dates of reporting period  01/06/2022 to   02/17/2022  Patient Details  Name: Lindsey [name] MRN: [id number] Date of Birth: Oct 05, 1955 Referring Provider (PT): Ranell Patrick Clide Deutscher, MD   Encounter Date: 02/17/2022   PT End of Session - 02/17/22 0932     Visit Number 20    Number of Visits 24    Date for PT Re-Evaluation 05/03/22    Authorization Time Period Cert through [id number]; Recert [id number]    Progress Note Due on Visit 20    PT Start Time 0931    PT Stop Time 1012    PT Time Calculation (min) 41 min    Equipment Utilized During Treatment Gait belt    Activity Tolerance Patient limited by fatigue    Behavior During Therapy WFL for tasks assessed/performed             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 No pertinent surgical history.  There were no vitals filed for this visit.   Subjective Assessment - 02/17/22 2108     Subjective Pt reports no changes stating she is trying to do more at home    Patient is accompained by: Family member   Lindsey [name]   Pertinent History Pt was doing very well and was walking short distances wqith a cane prior to second stroke in december. At this point she is limited to wheelchair as her main means of mobility. Pt caregiver reports all detailed information. Pt has hesitation with the left lower extremity and significant weakness on the right lower extremity. Pt will take 2-3  steps when transfering. Pt caregiver assists with all transfers.Pt has shower with ledge to steps over ledge when entering and utilizes bedside commode. Pt has 2 level home with chair lift for upstairs access. Pt would like to improve ransfers, improve neglect on her R side and improve her overall strength and mobility.Pt. is a 66 y.o. 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 Pt. has Right sided weakness, and receptive, and expressive aphasia. Pt. has had a previous CVA with right sided weakness in July 2021. PMHx includes: AFib, Renal Artery thrombosis, situational depression, HTN, Pyelnephritis, Seizure activity, COnstipation, AKI. Vitamin D deficiency, and urinary incontinence.    Limitations Standing;Walking;House hold activities    How long can you sit comfortably? n/a    How long can you stand comfortably? 2 min    How long can you walk comfortably? 3-4 steps    Patient Stated Goals Improve balance and functional movements    Currently in Pain? No/denies    Pain Onset More than a month ago             INTERVETNIONS:   Reassessed goals for progress note visit   FOTO= 40  Transfer assessment: Patient was able to transfer from transport w/c with some physical assist  with Right to swing to pivot to side of chair. She was able to sit to stand today with only CGA and then stand pivot with right arm holding onto arm of chair. Only difficulty was stand to sit- Max VC not to plop into chair.   Gait training = 85 feet with use of HW, Close CGA - Patient amb mostly with step to gait and better overall sequencing with less VC. She continues to require extensive time to complete due to slow cadence.                          PT Education - 02/17/22 2109     Education Details safety edu with gait.    Person(s) Educated Patient    Methods Explanation;Demonstration;Tactile cues;Verbal cues     Comprehension Verbalized understanding;Returned demonstration;Verbal cues required;Tactile cues required;Need further instruction              PT Short Term Goals - 02/08/22 2210       PT SHORT TERM GOAL #1   Title Patient will be independent in home exercise program to improve strength/mobility for better functional independence with ADLs.    Baseline no formal HEp for LE/balance at this time; 02/08/2022=Patient continues to require VC for reminders and assist with hip march/knee ext (seated for strengthening); Reminders to continue to work on static stand at home.    Time 4    Period Weeks    Status New    Target Date 12/14/21               PT Long Term Goals - 02/17/22 0943       PT LONG TERM GOAL #1   Title Patient will increase FOTO score to equal to or greater than   41  to demonstrate statistically significant improvement in mobility and quality of life.    Baseline 16 on 2/14; 02/08/2022=Patient unable to complete today- No family present but will attempt again next visit available with family present to assist in completion; 02/17/2022= 40    Time 12    Period Weeks    Status On-going    Target Date 05/03/22      PT LONG TERM GOAL #3   Title Patient  will complete five times sit to stand test in < 30 seconds with UE assistance indicating an increased LE strength and improved balance.    Baseline 58.47 s with UE assist and with A from PT for R LE positioning; 02/08/2022=54 sec with use of L UE support and Min physical assist to position right LE.    Time 12    Period Weeks    Status On-going    Target Date 05/03/22      PT LONG TERM GOAL #4   Title Patient will increase Berg Balance score by > 6 points to demonstrate decreased fall risk during functional activities.    Baseline 12 on 2/14; 02/08/2022= 13/56    Time 12    Period Weeks    Status On-going    Target Date 05/03/22      PT LONG TERM GOAL #5   Title Pt will transfer from seated position on table/chair  to/from transport chair/WC in order to indicate improved transfer ability.    Baseline Requires Min A for LE positioning and to prevent LOB. 02/08/2022=Patient attempted to perform on her own- Unable to push herself up from chair- Very min assist to position right LE into more flexed  position and VC for hand placement. Patient able to stand with min Assist with use of gait belt and VC to lean; 02/17/2022=Patient was able to transfer from transport w/c with some physical assist with Right to swing to pivot to side of chair. She was able to sit to stand today with only CGA and then stand pivot with right arm holding onto arm of chair. Only difficulty was stand to sit- Max VC not to plop into chair.    Time 12    Period Weeks    Status On-going    Target Date 05/03/22      PT LONG TERM GOAL #6   Title Patient will ambulate > 150 feet with CGA using Hemiwalker on level surfaces for improved household and short community distances.    Baseline 02/08/2022= 40 feet with use of HW, heavy CGA with max VC for gait sequencing and w/c follow. 5/18 = 85 feet with use of HW, close CGA    Time 12    Period Weeks    Status On-going    Target Date 05/03/22      PT LONG TERM GOAL #7   Title Pt will increase 10MWT by at least 0.13 m/s in order to demonstrate clinically significant improvement in community ambulation.    Baseline 02/08/2022= 0.05 m/s using HW    Time 12    Period Weeks    Status New    Target Date 05/03/22                   Plan - 02/17/22 [id number]     Clinical Impression Statement Goals were just assessed last week but patient presents today for progress note visit. She did demonstrate improved overall self-perceived ability as seen by FOTO score. She demonstrated less overall assist with transfers and able to walk further with decreased overall cues. She continues to present with weakness and impaired mobility/balance with increased fall risk. Patient's condition has the potential to improve  in response to therapy. Maximum improvement is yet to be obtained. The anticipated improvement is attainable and reasonable in a generally predictable time.    Personal Factors and Comorbidities Age;Comorbidity 1;Comorbidity 2;Comorbidity 3+;Time since onset of injury/illness/exacerbation;Other    Comorbidities AFib, Renal Artery thrombosis, situational depression, HTN, Pyelnephritis, Seizure activity, COnstipation, AKI    Examination-Activity Limitations Bathing;Bed Mobility;Carry;Locomotion Level;Dressing;Sit;Squat;Stairs;Stand;Toileting;Transfers    Examination-Participation Restrictions Church;Community Activity;Laundry    Stability/Clinical Decision Making Evolving/Moderate complexity    Rehab Potential Fair    PT Frequency 2x / week    PT Duration 12 weeks    PT Treatment/Interventions ADLs/Self Care Home Management;Gait training;Stair training;Therapeutic activities;Therapeutic exercise;Balance training;Neuromuscular re-education;Patient/family education;Wheelchair mobility training;Manual techniques;Energy conservation;Dry needling;Passive range of motion;Taping;Spinal Manipulations;Joint Manipulations    PT Next Visit Plan Gait training, Transfer training, LE strengthening as appropriate.    PT Home Exercise Plan HEP: general LE and postural exercises    Consulted and Agree with Plan of Care Patient             Patient will benefit from skilled therapeutic intervention in order to improve the following deficits and impairments:  Abnormal gait, Decreased activity tolerance, Decreased endurance, Decreased knowledge of use of DME, Decreased range of motion, Decreased strength, Hypomobility, Impaired flexibility, Difficulty walking, Decreased balance, Decreased coordination, Decreased knowledge of precautions, Decreased mobility, Decreased safety awareness  Visit Diagnosis: Muscle weakness (generalized)  Other lack of coordination  Abnormality of gait and mobility  Unsteadiness on  feet  Other abnormalities of gait and mobility  Problem List Patient Active Problem List   Diagnosis Date Noted   Blood clotting disorder ([place]) 12/27/2021   Elevated AST (SGOT) 10/22/2021   Lupus anticoagulant positive 10/22/2021   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)    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[place])    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 ([place]) 09/24/2021   Cerebral embolism with cerebral infarction 09/23/2021   Pyelonephritis 09/19/2021   Pain due to onychomycosis of toenails of both feet 11/19/2020   Normocytic anemia 05/31/2020   Acute ischemic left MCA stroke ([place]) 04/30/2020   Right hemiplegia ([place]) 04/30/2020   Cerebrovascular accident ([place]) 04/21/2020   Atrial fibrillation with RVR ([place]) 04/21/2020   Essential hypertension 04/21/2020   Alcohol abuse 04/21/2020    Lewis Moccasin, PT 02/17/2022, 9:10 PM  [place] MAIN [facility] SERVICES [place], [place], [address] Phone: [phone]   Fax:  [phone]  Name: Lindsey [name] MRN: [id number] Date of Birth: 07-07-1956

## 2022-02-22 ENCOUNTER — Ambulatory Visit: Payer: Medicare Other | Admitting: Speech Pathology

## 2022-02-22 ENCOUNTER — Ambulatory Visit: Payer: Medicare Other | Admitting: Occupational Therapy

## 2022-02-22 ENCOUNTER — Ambulatory Visit: Payer: Medicare Other

## 2022-02-22 ENCOUNTER — Encounter
Payer: Medicare Other | Attending: Physical Medicine and Rehabilitation | Admitting: Physical Medicine and Rehabilitation

## 2022-02-22 VITALS — BP 150/91 | HR 73

## 2022-02-22 DIAGNOSIS — R252 Cramp and spasm: Secondary | ICD-10-CM | POA: Diagnosis present

## 2022-02-22 DIAGNOSIS — I69398 Other sequelae of cerebral infarction: Secondary | ICD-10-CM | POA: Diagnosis present

## 2022-02-22 NOTE — Progress Notes (Signed)
Botox Injection for spasticity, palpation and activation approach used for localization since ultrasound machine was not working   Dilution: 1:1 Indication: Severe spasticity which interferes with ADL,mobility and/or  hygiene and is unresponsive to medication management and other conservative care   Informed consent was obtained after describing risks and benefits of the procedure with the patient. This includes bleeding, bruising, infection, excessive weakness, or medication side effects. A REMS form is on file and signed.  1) Pectoralis: 50U 2) Flexor digitorum profundus: 50U in 1 site 3) Flexor carpi radialis: 50U U in 1 site 4) Flexor digitorum superficialis: 50U in 1 site 5) Pronator quadratus: 50U 6) Pronator teres: 50U     All injections were done after palpation and patient activation of the muscle confirmed the accurate location, and after negative drawback for blood. The patient tolerated the procedure well. Post procedure instructions were given. A followup appointment was made.  

## 2022-02-24 ENCOUNTER — Ambulatory Visit: Payer: Medicare Other

## 2022-02-24 ENCOUNTER — Ambulatory Visit: Payer: Medicare Other | Admitting: Speech Pathology

## 2022-02-24 ENCOUNTER — Ambulatory Visit: Payer: Medicare Other | Admitting: Occupational Therapy

## 2022-02-24 DIAGNOSIS — R269 Unspecified abnormalities of gait and mobility: Secondary | ICD-10-CM | POA: Diagnosis not present

## 2022-02-24 DIAGNOSIS — R278 Other lack of coordination: Secondary | ICD-10-CM

## 2022-02-24 DIAGNOSIS — R2681 Unsteadiness on feet: Secondary | ICD-10-CM

## 2022-02-24 DIAGNOSIS — R2689 Other abnormalities of gait and mobility: Secondary | ICD-10-CM

## 2022-02-24 DIAGNOSIS — M6281 Muscle weakness (generalized): Secondary | ICD-10-CM

## 2022-02-24 NOTE — Therapy (Signed)
[place] MAIN [facility] SERVICES [place], [place], [address] Phone: [phone]   Fax:  [phone]  Occupational Therapy Treatment  Patient Details  Name: Lindsey [name] MRN: [id number] Date of Birth: [birth year] Referring Provider (OT): Dr. 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  There were no vitals filed for this visit.   Subjective Assessment - 02/24/22 1218     Subjective  Pt. left the communication device at home    Patient is accompanied by: Family member    Pertinent History Pt. is a 66 y.o. 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 Pt. has Right sided weakness, and receptive, and expressive aphasia. Pt. has had a previous CVA with right sided weakness in July 2021. PMHx includes: AFib, Renal Artery thrombosis, situational depression, HTN, Pyelnephritis, Seizure activity, COnstipation, AKI. Vitamin D deficiency, and urinary incontinence/    Currently in Pain? No/denies            Rationale for  Evaluation and Treatment Rehabilitation  OT TREATMENT     Manual Therapy:   Pt. tolerated soft tissue massage to the right scapular musculature. Pt. tolerated scapular mobilizations in elevation, depression, abduction, and rotation following moist heat modality to the right shoulder. Pt. Tolerated soft tissue mobilizations for radius on ulna, and metacarpal spread stretches to decrease tightness, and prepare the UE, and hand for ROM and therapeutic Ex.Manual therapy was performed independent of, and in preparation for there. Ex.    Therapeutic Exercise:   Pt. tolerated PROM through all joint ranges of the RUE, and hand following moist heat modality, and manual therapy. Pt. performed self-ROM to the right MPs, PIPs, and DIPs.  Pt. worked on [employer], and reciprocal motion using the UBE while seated for 8 min. With no resistance initilaly and increased to minimal resistance 3/4's of the way through. Constant monitoring was provided, along with support, and an ACE wrap in place to the right hand.   Pt. presents with flexor tone, and tightness. Pt. Continues to respond well to the session with less tone, tightness, and increased ROM noted thrughout the RUE following ROM, and manual therapy. Pt. presented with increased tightness with right shoulder abduction, and flexion. Pt. continues to work on normalizing tone, and facilitating active volitional movement in the RUE, and hand to improve engagement of the RUE  during ADLs, as well as improve transfers, and functional mobility for toileting.                         OT Education - 02/24/22 1218     Education Details RUE functioning    Person(s) Educated Patient    Methods Explanation;Verbal cues;Tactile cues;Demonstration    Comprehension Verbal cues required;Need further instruction;Returned demonstration;Tactile cues required;Verbalized understanding              OT Short Term Goals - 02/17/22 1032       OT SHORT  TERM GOAL #1   Title Pt and caregiver will be independent with HEP for RUE    Baseline Eval: No current program. 10th visit: Caregivers  report being comfortable with exercises at home. 01/06/2022: Continue ongoing HEPs for UE. 30th visit: Pt./caregivers to continue with ROM    Time 6    Period Weeks    Status On-going    Target Date 03/31/22               OT Long Term Goals - 02/17/22 1046       OT LONG TERM GOAL #1   Title Pt. will engage engage the RUE as a gross assist/atabilizer during ADLs 100% of the time    Baseline 30th visit: Pt. is initiating using her RUE as a gross stabilizer with maxA 01/06/2022: Pt. is starting to engage her RUE as a gross stabiliizer with increased cues. Eval: pt. does not currently engage her RUE during ADLs. 10th visit: Pt. continues to need to work towards using her RUE as a gross stabilizer.    Time 12    Status On-going    Target Date 03/31/22      OT LONG TERM GOAL #2   Title Pt. will perform UE dressing using one armed dressing techniques    Baseline Eval: MaxA, 10th visit: [place] 01/06/2022: min-modA 30th visit; min0-modA    Time 12    Period Weeks    Status On-going    Target Date 03/31/22      OT LONG TERM GOAL #3   Title Pt. will require minA to stand and hike pants    Baseline Eval: MaxA, 10th visit: [place] 01/06/2022: [place] 30th visit: ModA    Time 12    Period Weeks    Status On-going    Target Date 03/31/22      OT LONG TERM GOAL #4   Title Pt. will perform LE dressing with minA    Baseline Eval: MaxA 10th visit: Pt. requires MaxA for donning her brace, shoes, and socks. ModA with pants. 01/06/2022: ModA pants, MaxA shoes, socks, and brace 30th visit: ModA pants, MaxA shoes, socks, and brace.    Time 12    Period Weeks    Status On-going    Target Date 03/31/22      OT LONG TERM GOAL #5   Title Pt. will improve FOTO score by 2 pt. to reflect funcational change    Baseline Eval: FOTO score: 12, 10th visit: FOTO score 27  01/06/2022: 27 30th visit TBA at next visit secondary to no family available during the session.    Time 12    Period Weeks    Status On-going    Target Date 03/31/22      OT LONG TERM GOAL #6   Title Patient will transfer to bedside commode or toilet with supervision.    Baseline 30th visit: min-modA. 01/06/2022:  min-ModA    Time 4    Period Weeks    Status On-going    Target Date 03/31/22                   Plan - 02/24/22 1219     Clinical Impression Statement Pt. presents with flexor tone, and tightness. Pt. Continues to respond well to the session with less tone, tightness, and increased ROM noted thrughout the RUE following ROM, and manual therapy. Pt. presented with increased tightness with right shoulder abduction, and flexion. Pt. continues to work on normalizing tone, and facilitating active volitional movement in the RUE, and hand to improve engagement of the RUE during ADLs, as well as improve transfers, and functional mobility for toileting.      OT Occupational Profile and History Detailed Assessment- Review of Records and additional review of physical, cognitive, psychosocial history related to current functional performance    Occupational performance deficits (Please refer to evaluation for details): Leisure;IADL's;ADL's    Body Structure / Function / Physical Skills ADL;IADL;FMC;ROM;UE functional use;Strength;Decreased knowledge of use of DME;Coordination    Rehab Potential Fair    Clinical Decision Making Several treatment options, min-mod task modification necessary    Comorbidities Affecting Occupational Performance: May have comorbidities impacting occupational performance    Modification or Assistance to Complete Evaluation  Min-Moderate modification of tasks or assist with assess necessary to complete eval    OT Frequency 2x / week    OT Duration 12 weeks    OT Treatment/Interventions Self-care/ADL training;Neuromuscular education;Electrical  Stimulation;Patient/family education;Therapeutic activities;Passive range of motion;Therapist, nutritional;Therapeutic exercise;DME and/or AE instruction;Moist Heat    Consulted and Agree with Plan of Care Patient             Patient will benefit from skilled therapeutic intervention in order to improve the following deficits and impairments:   Body Structure / Function / Physical Skills: ADL, IADL, FMC, ROM, UE functional use, Strength, Decreased knowledge of use of DME, Coordination       Visit Diagnosis: Muscle weakness (generalized)    Problem List Patient Active Problem List   Diagnosis Date Noted   Blood clotting disorder ([place]) 12/27/2021   Elevated AST (SGOT) 10/22/2021   Lupus anticoagulant positive 10/22/2021   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)    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[place])    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 [place], [place], [address] Phone: [phone]   Fax:  [phone]  Name: Lindsey [name] MRN: [id number] Date of Birth: Oct 12, 1955

## 2022-02-24 NOTE — Therapy (Signed)
[place] MAIN [facility] SERVICES [address] [place], [place], [address] Phone: [phone]   Fax:  [phone]  Physical Therapy Treatment  Patient Details  Name: Lindsey [name] MRN: [id number] Date of Birth: 01/25/1956 Referring Provider (PT): Ranell Patrick Clide Deutscher, MD   Encounter Date: 02/24/2022   PT End of Session - 02/24/22 1241     Visit Number 21    Number of Visits 24    Date for PT Re-Evaluation 05/03/22    Authorization Time Period Cert through [id number]; Recert [id number]    Progress Note Due on Visit 20    PT Start Time 0934    PT Stop Time 1013    PT Time Calculation (min) 39 min    Equipment Utilized During Treatment Gait belt    Activity Tolerance Patient limited by fatigue    Behavior During Therapy WFL for tasks assessed/performed             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 No pertinent surgical history.  There were no vitals filed for this visit.   Subjective Assessment - 02/24/22 0940     Subjective Patient reports doing okay- Nothing new per her report- no pain or falls.    Patient is accompained by: Family member   Lindsey [name]   Pertinent History Pt was doing very well and was walking short distances wqith a cane prior to second stroke in december. At this point she is limited to wheelchair as her main means of mobility. Pt caregiver reports all detailed information. Pt has hesitation with the left lower extremity and significant weakness on the right lower extremity. Pt will take 2-3 steps when transfering. Pt caregiver assists with all transfers.Pt has  shower with ledge to steps over ledge when entering and utilizes bedside commode. Pt has 2 level home with chair lift for upstairs access. Pt would like to improve ransfers, improve neglect on her R side and improve her overall strength and mobility.Pt. is a 66 y.o. 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 Pt. has Right sided weakness, and receptive, and expressive aphasia. Pt. has had a previous CVA with right sided weakness in July 2021. PMHx includes: AFib, Renal Artery thrombosis, situational depression, HTN, Pyelnephritis, Seizure activity, COnstipation, AKI. Vitamin D deficiency, and urinary incontinence.    Limitations Standing;Walking;House hold activities    How long can you sit comfortably? n/a    How long can you stand comfortably? 2 min    How long can you walk comfortably? 3-4 steps    Patient Stated Goals Improve balance and functional movements    Currently in Pain? No/denies    Pain Onset More than a month ago              Manual therapy: Manual stretch to right hamstring in sitting- Hold 30 sec x 4; knee flex - hold 30 sec x 4 (measured at 97 deg) and knee ext after stretching =8deg    Therex:  LAQ- 10 reps  on  right (partial ROM)  AAROM ham curls on right x 15 reps Step taps- weight shifting to right x 10 steps with left (patient required VC and min physical assist to shift weight to right side)    Gait training:   Heel to toe gait sequencing activity- placed 2 cones in // bars and patient performed heel to toe x 20 reps each LE.   Gait in // bars- forward and backward - focusing on reciprocal steps and taking safe steps backward (to assist with backing up with transfers) - down and back x 6 trials. Patient fatigued and increased difficulty with retro gait   Sit to stand x 15 reps with Left UE support - VC for scooting/ min assist to flex right knee and reminders to push up from  armrest   Education provided throughout session via VC/TC and demonstration to facilitate movement at target joints and correct muscle activation for all testing and exercises performed.                   PT Education - 02/24/22 1243     Education Details safety cues with transfers    Person(s) Educated Patient    Methods Explanation;Demonstration;Tactile cues;Verbal cues    Comprehension Verbalized understanding;Returned demonstration;Verbal cues required;Tactile cues required;Need further instruction              PT Short Term Goals - 02/08/22 2210       PT SHORT TERM GOAL #1   Title Patient will be independent in home exercise program to improve strength/mobility for better functional independence with ADLs.    Baseline no formal HEp for LE/balance at this time; 02/08/2022=Patient continues to require VC for reminders and assist with hip march/knee ext (seated for strengthening); Reminders to continue to work on static stand at home.    Time 4    Period Weeks    Status New    Target Date 12/14/21               PT Long Term Goals - 02/17/22 0943       PT LONG TERM GOAL #1   Title Patient will increase FOTO score to equal to or greater than   41  to demonstrate statistically significant improvement in mobility and quality of life.    Baseline 16 on 2/14; 02/08/2022=Patient unable to complete today- No family present but will attempt again next visit available with family present to assist in completion; 02/17/2022= 40    Time 12    Period Weeks    Status On-going    Target Date 05/03/22      PT LONG TERM GOAL #3   Title Patient  will complete five times sit to stand test in < 30 seconds with UE assistance indicating an increased LE strength and improved balance.    Baseline 58.47 s with UE assist and with A from PT for R LE positioning; 02/08/2022=54 sec with use of L UE support and Min physical assist to position right LE.    Time 12    Period Weeks     Status On-going    Target Date 05/03/22      PT LONG TERM GOAL #4   Title Patient will increase Berg Balance score by > 6 points to demonstrate decreased fall risk during functional activities.    Baseline 12 on 2/14; 02/08/2022= 13/56    Time 12    Period Weeks    Status On-going    Target Date 05/03/22  PT LONG TERM GOAL #5   Title Pt will transfer from seated position on table/chair to/from transport chair/WC in order to indicate improved transfer ability.    Baseline Requires Min A for LE positioning and to prevent LOB. 02/08/2022=Patient attempted to perform on her own- Unable to push herself up from chair- Very min assist to position right LE into more flexed position and VC for hand placement. Patient able to stand with min Assist with use of gait belt and VC to lean; 02/17/2022=Patient was able to transfer from transport w/c with some physical assist with Right to swing to pivot to side of chair. She was able to sit to stand today with only CGA and then stand pivot with right arm holding onto arm of chair. Only difficulty was stand to sit- Max VC not to plop into chair.    Time 12    Period Weeks    Status On-going    Target Date 05/03/22      PT LONG TERM GOAL #6   Title Patient will ambulate > 150 feet with CGA using Hemiwalker on level surfaces for improved household and short community distances.    Baseline 02/08/2022= 40 feet with use of HW, heavy CGA with max VC for gait sequencing and w/c follow. 5/18 = 85 feet with use of HW, close CGA    Time 12    Period Weeks    Status On-going    Target Date 05/03/22      PT LONG TERM GOAL #7   Title Pt will increase 10MWT by at least 0.13 m/s in order to demonstrate clinically significant improvement in community ambulation.    Baseline 02/08/2022= 0.05 m/s using HW    Time 12    Period Weeks    Status New    Target Date 05/03/22                   Plan - 02/24/22 1240     Clinical Impression Statement Patient performed  well after manual stretching with improved ability to flex right knee and responded well to all cues for safety with standing and later gait cues. She was able to take reciprocal steps in // bars and even backward some today. She was limited by fatigue and continues to require same cues for transfer safety. She would benefit from additional skilled PT intervention to improve strength, balance and mobility    Personal Factors and Comorbidities Age;Comorbidity 1;Comorbidity 2;Comorbidity 3+;Time since onset of injury/illness/exacerbation;Other    Comorbidities AFib, Renal Artery thrombosis, situational depression, HTN, Pyelnephritis, Seizure activity, COnstipation, AKI    Examination-Activity Limitations Bathing;Bed Mobility;Carry;Locomotion Level;Dressing;Sit;Squat;Stairs;Stand;Toileting;Transfers    Examination-Participation Restrictions Church;Community Activity;Laundry    Stability/Clinical Decision Making Evolving/Moderate complexity    Rehab Potential Fair    PT Frequency 2x / week    PT Duration 12 weeks    PT Treatment/Interventions ADLs/Self Care Home Management;Gait training;Stair training;Therapeutic activities;Therapeutic exercise;Balance training;Neuromuscular re-education;Patient/family education;Wheelchair mobility training;Manual techniques;Energy conservation;Dry needling;Passive range of motion;Taping;Spinal Manipulations;Joint Manipulations    PT Next Visit Plan Gait training, Transfer training, LE strengthening as appropriate.    PT Home Exercise Plan HEP: general LE and postural exercises    Consulted and Agree with Plan of Care Patient             Patient will benefit from skilled therapeutic intervention in order to improve the following deficits and impairments:  Abnormal gait, Decreased activity tolerance, Decreased endurance, Decreased knowledge of use of DME, Decreased range of motion, Decreased  strength, Hypomobility, Impaired flexibility, Difficulty walking, Decreased  balance, Decreased coordination, Decreased knowledge of precautions, Decreased mobility, Decreased safety awareness  Visit Diagnosis: Muscle weakness (generalized)  Other lack of coordination  Abnormality of gait and mobility  Unsteadiness on feet  Other abnormalities of gait and mobility     Problem List Patient Active Problem List   Diagnosis Date Noted   Blood clotting disorder ([place]) 12/27/2021   Elevated AST (SGOT) 10/22/2021   Lupus anticoagulant positive 10/22/2021   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)    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[place])    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 ([place]) 09/24/2021   Cerebral embolism with cerebral infarction 09/23/2021   Pyelonephritis 09/19/2021   Pain due to onychomycosis of toenails of both feet 11/19/2020   Normocytic anemia 05/31/2020   Acute ischemic left MCA stroke ([place]) 04/30/2020   Right hemiplegia ([place]) 04/30/2020   Cerebrovascular accident ([place]) 04/21/2020   Atrial fibrillation with RVR ([place]) 04/21/2020   Essential hypertension 04/21/2020   Alcohol abuse 04/21/2020    Lewis Moccasin, PT 02/24/2022, 12:43 PM  [place] MAIN [facility] SERVICES [place], [place], [address] Phone: [phone]   Fax:  [phone]  Name: Lindsey [name] MRN: [id number] Date of Birth: 15-Sep-1956

## 2022-03-01 ENCOUNTER — Ambulatory Visit: Payer: Medicare Other | Admitting: Speech Pathology

## 2022-03-01 ENCOUNTER — Ambulatory Visit: Payer: Medicare Other

## 2022-03-01 ENCOUNTER — Encounter: Payer: Self-pay | Admitting: Occupational Therapy

## 2022-03-01 ENCOUNTER — Ambulatory Visit: Payer: Medicare Other | Admitting: Occupational Therapy

## 2022-03-01 DIAGNOSIS — I63512 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 middle cerebral artery: Secondary | ICD-10-CM

## 2022-03-01 DIAGNOSIS — R269 Unspecified abnormalities of gait and mobility: Secondary | ICD-10-CM | POA: Diagnosis not present

## 2022-03-01 DIAGNOSIS — R4701 Aphasia: Secondary | ICD-10-CM

## 2022-03-01 DIAGNOSIS — R2689 Other abnormalities of gait and mobility: Secondary | ICD-10-CM

## 2022-03-01 DIAGNOSIS — R278 Other lack of coordination: Secondary | ICD-10-CM

## 2022-03-01 DIAGNOSIS — R482 Apraxia: Secondary | ICD-10-CM

## 2022-03-01 DIAGNOSIS — M6281 Muscle weakness (generalized): Secondary | ICD-10-CM

## 2022-03-01 DIAGNOSIS — R2681 Unsteadiness on feet: Secondary | ICD-10-CM

## 2022-03-01 NOTE — Therapy (Signed)
[facility]IN [facility] [address] [place], [place], [address] Phone: [phone]   Fax:  [phone]  Physical Therapy Treatment  Patient Details  Name: Lindsey [name] MRN: [id number] Date of Birth: 10-01-1956 Referring Provider (PT): Carlis Abbottaulkar, Drema PryKrutika P, MD   Encounter Date: 03/01/2022   PT End of Session - 03/01/22 0947     Visit Number 22    Number of Visits 41    Date for PT Re-Evaluation 05/03/22    Authorization Time Period Cert through 02/08/22; Recert 02/08/2022-05/03/2022    Progress Note Due on Visit 20    PT Start Time 0930    PT Stop Time 1012    PT Time Calculation (min) 42 min    Equipment Utilized During Treatment Gait belt    Activity Tolerance Patient limited by fatigue    Behavior During Therapy WFL for tasks assessed/performed             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 No pertinent surgical history.  There were no vitals filed for this visit.   Subjective Assessment - 03/01/22 0946     Subjective Patient reports no changes- Reports didn't do much over the weekend.    Patient is accompained by: Family member   Lindsey [name]   Pertinent History Pt was doing very well and was walking short distances wqith a cane prior to second stroke in december. At this point she is limited to wheelchair as her main means of mobility. Pt caregiver reports all detailed information. Pt has hesitation with the left lower extremity and significant weakness on the right lower extremity. Pt will take 2-3 steps when transfering. Pt caregiver assists with all transfers.Pt has shower  with ledge to steps over ledge when entering and utilizes bedside commode. Pt has 2 level home with chair lift for upstairs access. Pt would like to improve ransfers, improve neglect on her R side and improve her overall strength and mobility.Pt. is a 66 y.o. 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 Pt. has Right sided weakness, and receptive, and expressive aphasia. Pt. has had a previous CVA with right sided weakness in July 2021. PMHx includes: AFib, Renal Artery thrombosis, situational depression, HTN, Pyelnephritis, Seizure activity, COnstipation, AKI. Vitamin D deficiency, and urinary incontinence.    Limitations Standing;Walking;House hold activities    How long can you sit comfortably? n/a    How long can you stand comfortably? 2 min    How long can you walk comfortably? 3-4 steps    Patient Stated Goals Improve balance and functional movements    Currently in Pain? No/denies    Pain Onset More than a month ago             Manual therapy: Manual stretch to right hamstring in sitting- Hold 30 sec x 4; knee flex - hold 30 sec x 4 (measured at 106 deg)     Therex:  LAQ- 12 reps  on right (partial ROM) ; 12 reps on left  with 3lb AW x 2 sets AAROM ham curls on right x 12 reps/AROM on Left LE with 3lb AW x 2 sets Hip march - AAROM on right and 3lb on Left (AROM) - x 12 reps.  Sit to stand x 15 reps with Left UE support - VC for scooting/ min assist to flex right knee and reminders to push up from armrest   Gait training:    Heel to toe gait sequencing activity- placed 2 cones in // bars and patient performed heel to toe x 20 reps each LE.    Gait in clinic on firm surface at 150 feet today using hemiwalker, close CGA and w/c follow- Patient with less VC for sequencing today.       Education provided throughout session via VC/TC and demonstration to facilitate movement at target joints and correct muscle  activation for all testing and exercises performed                           PT Education - 03/01/22 0947     Education Details Exercise technique    Person(s) Educated Patient    Methods Explanation;Demonstration;Tactile cues;Verbal cues    Comprehension Verbalized understanding;Returned demonstration;Verbal cues required;Tactile cues required;Need further instruction              PT Short Term Goals - 02/08/22 2210       PT SHORT TERM GOAL #1   Title Patient will be independent in home exercise program to improve strength/mobility for better functional independence with ADLs.    Baseline no formal HEp for LE/balance at this time; 02/08/2022=Patient continues to require VC for reminders and assist with hip march/knee ext (seated for strengthening); Reminders to continue to work on static stand at home.    Time 4    Period Weeks    Status New    Target Date 12/14/21               PT Long Term Goals - 02/17/22 0943       PT LONG TERM GOAL #1   Title Patient will increase FOTO score to equal to or greater than   41  to demonstrate statistically significant improvement in mobility and quality of life.    Baseline 16 on 2/14; 02/08/2022=Patient unable to complete today- No family present but will attempt again next visit available with family present to assist in completion; 02/17/2022= 40    Time 12    Period Weeks    Status On-going    Target Date 05/03/22      PT LONG TERM GOAL #3   Title Patient  will complete five times sit to stand test in < 30 seconds with UE assistance indicating an increased LE strength and improved balance.    Baseline 58.47 s with UE assist and with A from PT for R LE positioning; 02/08/2022=54 sec with use of L UE support and Min physical assist to position right LE.    Time 12    Period Weeks    Status On-going    Target Date 05/03/22      PT LONG TERM GOAL #4   Title Patient will increase Berg Balance score by > 6 points  to demonstrate decreased fall risk during functional activities.    Baseline 12 on 2/14; 02/08/2022= 13/56    Time 12    Period Weeks    Status On-going    Target Date 05/03/22  PT LONG TERM GOAL #5   Title Pt will transfer from seated position on table/chair to/from transport chair/WC in order to indicate improved transfer ability.    Baseline Requires Min A for LE positioning and to prevent LOB. 02/08/2022=Patient attempted to perform on her own- Unable to push herself up from chair- Very min assist to position right LE into more flexed position and VC for hand placement. Patient able to stand with min Assist with use of gait belt and VC to lean; 02/17/2022=Patient was able to transfer from transport w/c with some physical assist with Right to swing to pivot to side of chair. She was able to sit to stand today with only CGA and then stand pivot with right arm holding onto arm of chair. Only difficulty was stand to sit- Max VC not to plop into chair.    Time 12    Period Weeks    Status On-going    Target Date 05/03/22      PT LONG TERM GOAL #6   Title Patient will ambulate > 150 feet with CGA using Hemiwalker on level surfaces for improved household and short community distances.    Baseline 02/08/2022= 40 feet with use of HW, heavy CGA with max VC for gait sequencing and w/c follow. 5/18 = 85 feet with use of HW, close CGA    Time 12    Period Weeks    Status On-going    Target Date 05/03/22      PT LONG TERM GOAL #7   Title Pt will increase by at least 0.13 m/s in order to demonstrate clinically significant improvement in community ambulation.    Baseline 02/08/2022= 0.05 m/s using HW    Time 12    Period Weeks    Status New    Target Date 05/03/22                   Plan - 03/01/22 0949     Clinical Impression Statement Patient continues to demo improved overall right knee mobility and less overall VC with transfers and gait today. She demonstrated one of her best  overall days to date with gait distance and quality. She was very fatigued at end of session but demonstrated progress overall today. She would benefit from additional skilled PT intervention to improve strength, balance and mobility    Personal Factors and Comorbidities Age;Comorbidity 1;Comorbidity 2;Comorbidity 3+;Time since onset of injury/illness/exacerbation;Other    Comorbidities AFib, Renal Artery thrombosis, situational depression, HTN, Pyelnephritis, Seizure activity, COnstipation, AKI    Examination-Activity Limitations Bathing;Bed Mobility;Carry;Locomotion Level;Dressing;Sit;Squat;Stairs;Stand;Toileting;Transfers    Examination-Participation Restrictions Church;Community Activity;Laundry    Stability/Clinical Decision Making Evolving/Moderate complexity    Rehab Potential Fair    PT Frequency 2x / week    PT Duration 12 weeks    PT Treatment/Interventions ADLs/Self Care Home Management;Gait training;Stair training;Therapeutic activities;Therapeutic exercise;Balance training;Neuromuscular re-education;Patient/family education;Wheelchair mobility training;Manual techniques;Energy conservation;Dry needling;Passive range of motion;Taping;Spinal Manipulations;Joint Manipulations    PT Next Visit Plan Gait training, Transfer training, LE strengthening as appropriate.    PT Home Exercise Plan HEP: general LE and postural exercises    Consulted and Agree with Plan of Care Patient             Patient will benefit from skilled therapeutic intervention in order to improve the following deficits and impairments:  Abnormal gait, Decreased activity tolerance, Decreased endurance, Decreased knowledge of use of DME, Decreased range of motion, Decreased strength, Hypomobility, Impaired flexibility, Difficulty walking, Decreased balance, Decreased coordination,  Decreased knowledge of precautions, Decreased mobility, Decreased safety awareness  Visit Diagnosis: Muscle weakness (generalized)  Other  lack of coordination  Abnormality of gait and mobility  Unsteadiness on feet  Other abnormalities of gait and mobility     Problem List 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 [place], [place], [address] Phone: [phone]   Fax:  [phone]  Name: Lindsey [name] MRN: [id number] Date of Birth: October 06, 1955

## 2022-03-01 NOTE — Therapy (Signed)
[place] MAIN [facility] SERVICES [place], [place], [address] Phone: [phone]   Fax:  [phone]  Occupational Therapy Treatment  Patient Details  Name: Lindsey [name] MRN: [id number] Date of Birth: 18-Dec-1955 Referring Provider (OT): Dr. 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 No pertinent surgical history.  There were no vitals filed for this visit.   Subjective Assessment - 03/01/22 1213     Subjective  Pt. left the communication device at home    Patient is accompanied by: Family member    Pertinent History Pt. is a 66 y.o. 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 Pt. has Right sided weakness, and receptive, and expressive aphasia. Pt. has had a previous CVA with right sided weakness in July 2021. PMHx includes: AFib, Renal Artery thrombosis, situational depression, HTN, Pyelnephritis, Seizure activity, COnstipation, AKI. Vitamin D deficiency, and urinary incontinence/    Currently in Pain? No/denies            Rationale for Evaluation and Treatment Rehabilitation  OT  TREATMENT     Manual Therapy:   Pt. tolerated soft tissue massage to the right scapular musculature. Pt. tolerated scapular mobilizations in elevation, depression, abduction, and rotation following moist heat modality to the right shoulder. Pt. tolerated soft tissue mobilizations for radius on ulna, and metacarpal spread stretches to decrease tightness, and prepare the UE, and hand for ROM and therapeutic Ex.Manual therapy was performed independent of, and in preparation for there. Ex.    Therapeutic Exercise:   Pt. tolerated PROM through all joint ranges of the RUE, and hand following moist heat modality, and manual therapy. Pt. performed self-ROM to the right MPs, PIPs, and DIPs.  Pt. worked on [employer], and reciprocal motion using the UBE while seated for 8 min. With no resistance initilaly and increased to minimal resistance 3/4's of the way through. Constant monitoring was provided, along with support, and an ACE wrap in place to the right hand.   Pt.'s daughter reports that they ran out of time this morning, and were not able to put on her right shoulder harness brace. Pt. presents with flexor tone, and tightness. Pt. continues to respond well to the session with less tone, tightness, and increased ROM noted thrughout the RUE following ROM, and manual therapy. Pt. presented with increased tightness with right shoulder abduction, and flexion. Pt. continues to work on normalizing tone, and facilitating  active volitional movement in the RUE, and hand to improve engagement of the RUE during ADLs, as well as improve transfers, and functional mobility for toileting.                                  OT Education - 03/01/22 1213     Education Details RUE functioning    Person(s) Educated Patient    Methods Explanation;Verbal cues;Tactile cues;Demonstration    Comprehension Verbal cues required;Need further instruction;Returned demonstration;Tactile cues  required;Verbalized understanding              OT Short Term Goals - 02/17/22 1032       OT SHORT TERM GOAL #1   Title Pt and caregiver will be independent with HEP for RUE    Baseline Eval: No current program. 10th visit: Caregivers  report being comfortable with exercises at home. 01/06/2022: Continue ongoing HEPs for UE. 30th visit: Pt./caregivers to continue with ROM    Time 6    Period Weeks    Status On-going    Target Date 03/31/22               OT Long Term Goals - 02/17/22 1046       OT LONG TERM GOAL #1   Title Pt. will engage engage the RUE as a gross assist/atabilizer during ADLs 100% of the time    Baseline 30th visit: Pt. is initiating using her RUE as a gross stabilizer with maxA 01/06/2022: Pt. is starting to engage her RUE as a gross stabiliizer with increased cues. Eval: pt. does not currently engage her RUE during ADLs. 10th visit: Pt. continues to need to work towards using her RUE as a gross stabilizer.    Time 12    Status On-going    Target Date 03/31/22      OT LONG TERM GOAL #2   Title Pt. will perform UE dressing using one armed dressing techniques    Baseline Eval: MaxA, 10th visit: [place] 01/06/2022: min-modA 30th visit; min0-modA    Time 12    Period Weeks    Status On-going    Target Date 03/31/22      OT LONG TERM GOAL #3   Title Pt. will require minA to stand and hike pants    Baseline Eval: MaxA, 10th visit: [place] 01/06/2022: [place] 30th visit: ModA    Time 12    Period Weeks    Status On-going    Target Date 03/31/22      OT LONG TERM GOAL #4   Title Pt. will perform LE dressing with minA    Baseline Eval: MaxA 10th visit: Pt. requires MaxA for donning her brace, shoes, and socks. ModA with pants. 01/06/2022: ModA pants, MaxA shoes, socks, and brace 30th visit: ModA pants, MaxA shoes, socks, and brace.    Time 12    Period Weeks    Status On-going    Target Date 03/31/22      OT LONG TERM GOAL #5   Title Pt. will improve FOTO score  by 2 pt. to reflect funcational change    Baseline Eval: FOTO score: 12, 10th visit: FOTO score 27 01/06/2022: 27 30th visit TBA at next visit secondary to no family available during the session.    Time 12    Period Weeks    Status On-going    Target Date 03/31/22      OT LONG TERM  GOAL #6   Title Patient will transfer to bedside commode or toilet with supervision.    Baseline 30th visit: min-modA. 01/06/2022: min-ModA    Time 4    Period Weeks    Status On-going    Target Date 03/31/22                   Plan - 03/01/22 1216     Clinical Impression Statement Pt.'s daughter reports that they ran out of time this morning, and were not able to put on her right shoulder harness brace. Pt. presents with flexor tone, and tightness. Pt. continues to respond well to the session with less tone, tightness, and increased ROM noted thrughout the RUE following ROM, and manual therapy. Pt. presented with increased tightness with right shoulder abduction, and flexion. Pt. continues to work on normalizing tone, and facilitating active volitional movement in the RUE, and hand to improve engagement of the RUE during ADLs, as well as improve transfers, and functional mobility for toileting.            OT Occupational Profile and History Detailed Assessment- Review of Records and additional review of physical, cognitive, psychosocial history related to current functional performance    Occupational performance deficits (Please refer to evaluation for details): Leisure;IADL's;ADL's    Body Structure / Function / Physical Skills ADL;IADL;FMC;ROM;UE functional use;Strength;Decreased knowledge of use of DME;Coordination    Rehab Potential Fair    Clinical Decision Making Several treatment options, min-mod task modification necessary    Comorbidities Affecting Occupational Performance: May have comorbidities impacting occupational performance    Modification or Assistance to Complete Evaluation   Min-Moderate modification of tasks or assist with assess necessary to complete eval    OT Frequency 2x / week    OT Duration 12 weeks    OT Treatment/Interventions Self-care/ADL training;Neuromuscular education;Electrical Stimulation;Patient/family education;Therapeutic activities;Passive range of motion;Therapist, nutritional;Therapeutic exercise;DME and/or AE instruction;Moist Heat    Consulted and Agree with Plan of Care Patient             Patient will benefit from skilled therapeutic intervention in order to improve the following deficits and impairments:   Body Structure / Function / Physical Skills: ADL, IADL, FMC, ROM, UE functional use, Strength, Decreased knowledge of use of DME, Coordination       Visit Diagnosis: Muscle weakness (generalized)  Other lack of coordination    Problem List Patient Active Problem List   Diagnosis Date Noted   Blood clotting disorder ([place]) 12/27/2021   Elevated AST (SGOT) 10/22/2021   Lupus anticoagulant positive 10/22/2021   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)    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[place])    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 ([place]) 09/24/2021   Cerebral embolism with cerebral infarction 09/23/2021   Pyelonephritis 09/19/2021   Pain due to onychomycosis of toenails of both feet 11/19/2020   Normocytic anemia 05/31/2020   Acute ischemic left MCA stroke ([place]) 04/30/2020   Right hemiplegia ([place]) 04/30/2020   Cerebrovascular accident ([place]) 04/21/2020   Atrial fibrillation with RVR ([place]) 04/21/2020   Essential hypertension 04/21/2020   Alcohol abuse 04/21/2020   Harrel Carina, MS, OTR/L   Harrel Carina, OT 03/01/2022, 12:17 PM  [place] MAIN [facility] SERVICES [address] [place], [place], [address] Phone: [phone]   Fax:  [phone]  Name: Lindsey [name] MRN:  [id number] Date of Birth: [birth year]

## 2022-03-01 NOTE — Therapy (Signed)
[place] MAIN [facility] SERVICES [place], [place], [address] Phone: [phone]   Fax:  [phone]  Speech Language Pathology Treatment  Patient Details  Name: Lindsey [name] MRN: [id number] Date of Birth: 1956-02-25 Referring Provider (SLP): Dr. 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  There were no vitals filed for this visit.   Subjective Assessment - 03/01/22 1251     Subjective Pt selected, "I'm fine." on her device with mod cues.    Patient is accompained by: Family member   daughter [name]   Currently in Pain? No/denies                   ADULT SLP TREATMENT - 03/01/22 1252       General Information   Behavior/Cognition Alert;Cooperative;Pleasant mood    HPI Patient is a 66 y.o. female with prior history of CVA in 2021 with residual aphasia (using Lingraphica AAC device to assist with communication as of 08/2021) referred by Dr. Ranell Patrick after recent hospital admission when she was found to have new L AICA infarct complicated by pyelonephritis. Admitted to [facility] and then CIR 12/18-12/30/22. MRI 09/22/21 showed acute moderate size L AICA cerebellar infarct, underlying extensive chronic left  MCA encephalomalacia related to 2021 infarct, left brainstem Wallerian degeneration and chronic left PICA cerebellar infarcts.      Treatment Provided   Treatment provided Cognitive-Linquistic      Cognitive-Linquistic Treatment   Treatment focused on Aphasia;Apraxia    Skilled Treatment Continued caregiver training on modifying/adding content to [place] device using search and edit features, daughter able to return demonstration x3. Continued education and modelling partner-assisted scanning. Pt answered questions and introduced self to graduate clinician with usual mod A for navigating device and occasional verbal/gestural partner assisted scanning.      Assessment / Recommendations / Plan   Plan Continue with current plan of care      Progression Toward Goals   Progression toward goals Progressing toward goals              SLP Education - 03/01/22 1756     Education Details use of search feature, edit menu to locate/modify/create icons              SLP Short Term Goals - 12/13/21 0924       SLP SHORT TERM GOAL #1   Title Pt will answer personally relevant Y/N questions in context using multimodal communication or AAC if necessary, 75% accuracy.    Time 10  Period --   sessions   Status Achieved      SLP SHORT TERM GOAL #2   Title Given verbal or visual prompt, pt will correctly ID written word/object/icon in field of 4-6 to communicate wants/needs with 75% accuracy given occasional mod A over 2 sessions    Baseline F:3 12/09/21    Time 10    Period --   visits   Status Not Met      SLP SHORT TERM GOAL #3   Title Pt will verbally approximate 3 significant words for 5/10 trials given occasional fading to rare mod A over 2 sessions    Time 10    Period --   sessions   Status Achieved              SLP Long Term Goals - 01/27/22 0836       SLP LONG TERM GOAL #1   Title Pt will use multimodal communication system with mod assist to express wants/needs  given >2 choices over 2 sessions for 4/5 opportunities    Time 12    Period Weeks    Status Achieved      SLP LONG TERM GOAL #2   Title Caregiver will demonstrate appropriate use of supported conversation and multimodal supports to aid pt's comprehension and expression of personally relevant topics, over 4 sessions.    Time 6    Period Weeks    Status Revised    Target Date 03/10/22      SLP LONG TERM GOAL #3   Title Caregiver will appropriately cue patient when apraxic/aphasic errors occurs for 4/5 opportunities over 2 sessions    Time 6    Period Weeks    Status On-going    Target Date 03/10/22      SLP LONG TERM GOAL #4   Title Caregiver will demonstrate ability to modify [employer]raph[employer] device with modified independence over 3 sessions.    Time 6    Period Weeks    Status New    Target Date 03/10/22              Plan - 03/01/22 1253     Clinical Impression Statement Rielynn presents with severe aphasia and severe verbal apraxia, chronic deficits exacerbated by new CVA in December 2022. Marland Kitchen Pt accuracy with making selections in role-play scenarios improves with use of partner assisted scanning. Daughter demonstrates ability to modify and move icons today with teachback. Continue skilled ST with focus on functional communication and caregiver training to improve ability to communicate wants/needs, reduce frustration/social isolation and improve quality of life. Plan for d/c next visit.    Speech Therapy Frequency 2x / week    Duration 12 weeks    Treatment/Interventions Compensatory strategies;Patient/family education;Functional tasks;Cueing hierarchy;Environmental controls;Cognitive reorganization;Multimodal communcation approach;Language facilitation;Compensatory techniques;Internal/external aids;SLP instruction and feedback;Other (comment)    Potential to Achieve Goals Fair    Potential Considerations Severity of impairments;Previous level of function    Consulted and Agree  with Plan of Care Patient;Family member/caregiver    Family Member Consulted daughter             Patient will benefit from skilled therapeutic intervention in order to improve the following deficits and impairments:   Aphasia  Verbal apraxia  Acute ischemic left MCA stroke [facility])    Problem List Patient Active Problem List   Diagnosis Date Noted   Blood clotting disorder ([place]) 12/27/2021   Elevated AST (SGOT) 10/22/2021   Lupus anticoagulant positive 10/22/2021   Combined  receptive and expressive aphasia as late effect of cerebrovascular accident (CVA)    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[place])    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 ([place]) 09/24/2021   Cerebral embolism with cerebral infarction 09/23/2021   Pyelonephritis 09/19/2021   Pain due to onychomycosis of toenails of both feet 11/19/2020   Normocytic anemia 05/31/2020   Acute ischemic left MCA stroke ([place]) 04/30/2020   Right hemiplegia ([place]) 04/30/2020   Cerebrovascular accident ([place]) 04/21/2020   Atrial fibrillation with RVR ([place]) 04/21/2020   Essential hypertension 04/21/2020   Alcohol abuse 04/21/2020   Deneise Lever, [place], CCC-SLP Speech-Language Pathologist [phone]  Aliene Altes, [place] 03/01/2022, 5:57 PM  [place] MAIN [facility] SERVICES [address] [place], [place], [address] Phone: [phone]   Fax:  [phone]   Name: Natilee Gauer MRN: [id number] Date of Birth: 15-Sep-1956

## 2022-03-07 ENCOUNTER — Encounter: Payer: Medicare Other | Admitting: Speech Pathology

## 2022-03-08 ENCOUNTER — Ambulatory Visit: Payer: Medicare Other | Attending: Physical Medicine and Rehabilitation | Admitting: Speech Pathology

## 2022-03-08 ENCOUNTER — Encounter: Payer: Self-pay | Admitting: Occupational Therapy

## 2022-03-08 ENCOUNTER — Ambulatory Visit: Payer: Medicare Other | Admitting: Occupational Therapy

## 2022-03-08 ENCOUNTER — Ambulatory Visit: Payer: Medicare Other

## 2022-03-08 DIAGNOSIS — M6281 Muscle weakness (generalized): Secondary | ICD-10-CM | POA: Diagnosis present

## 2022-03-08 DIAGNOSIS — I63542 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: Secondary | ICD-10-CM | POA: Insufficient documentation

## 2022-03-08 DIAGNOSIS — R482 Apraxia: Secondary | ICD-10-CM | POA: Insufficient documentation

## 2022-03-08 DIAGNOSIS — I63512 Cerebral infarction due to unspecified occlusion or stenosis of left middle cerebral artery: Secondary | ICD-10-CM | POA: Diagnosis present

## 2022-03-08 DIAGNOSIS — R278 Other lack of coordination: Secondary | ICD-10-CM | POA: Diagnosis present

## 2022-03-08 DIAGNOSIS — R4701 Aphasia: Secondary | ICD-10-CM | POA: Insufficient documentation

## 2022-03-08 DIAGNOSIS — R2689 Other abnormalities of gait and mobility: Secondary | ICD-10-CM

## 2022-03-08 DIAGNOSIS — R2681 Unsteadiness on feet: Secondary | ICD-10-CM

## 2022-03-08 DIAGNOSIS — R269 Unspecified abnormalities of gait and mobility: Secondary | ICD-10-CM | POA: Diagnosis present

## 2022-03-08 DIAGNOSIS — R262 Difficulty in walking, not elsewhere classified: Secondary | ICD-10-CM

## 2022-03-08 NOTE — Patient Instructions (Signed)
Aphasia Groups  [facility]: GroundTransfer.at They have groups that meet remotely over [place]. [facility] [place] with Dr. Leo Rod at [facility] - email [email or web address]  Virtual Connections on [place] website:  Virtual Connections (PaidProducts.be)  Adella Hare  I contacted [employer] and they helped me turn the access on for using [employer] Therapy on the web browser on [place]'s device. They are working on developing an app for [employer]. (If you have trouble with this in the future, tap the 3 dots in the corner of the screen and select "Desktop mode.") If you have additional issues you can contact their tech support: Monday to Friday, 8:30 a.m. to 7:00 p.m. (ET).  Call toll free at [phone] Call direct at [phone]   If you notice Amreen is "stuck" repeating a word, phrase or action: Take a break. "Watch me" Have her watch what you do/say without trying to do it. "Say it with me" See if she can say the words along with you slowly. "Say it by yourself." If she can repeat the words with you, see if she can say it by herself.

## 2022-03-08 NOTE — Therapy (Signed)
[place] MAIN [facility] SERVICES [address] [place], [place], [address] Phone: [phone]   Fax:  [phone]  Speech Language Pathology Treatment and Discharge Summary  Patient Details  Name: Lindsey [name] MRN: [id number] Date of Birth: [birth year] Referring Provider (SLP): Dr. Ranell Patrick  SPEECH THERAPY DISCHARGE SUMMARY  Visits from Start of Care: 25  Current functional level related to goals / functional outcomes: Pt met 3/3 STGs and 1/4 LTGs. 3/4 LTGs partially met due to limited caregiver availability.   Remaining deficits: Chronic severe verbal apraxia and aphasia.    Education / Equipment: Aphasia community resources, [place] support, activities for home, how to support pt in communicating   Patient agrees to discharge. Patient goals were partially met. Patient is being discharged due to maximized rehab potential. .    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  There were no vitals filed for this visit.   Subjective Assessment - 03/08/22 1244     Subjective Pt unaccompanied to session.    Patient is accompained by: --   Lupita Raider dropped pt off   Currently in Pain?  No/denies                   ADULT SLP TREATMENT - 03/08/22 1245       General Information   Behavior/Cognition Alert;Cooperative;Pleasant mood    HPI Patient is a 66 y.o. female with prior history of CVA in 2021 with residual aphasia (using Lingraphica AAC device to assist with communication as of 08/2021) referred by Dr. Ranell Patrick after recent hospital admission when she was found to have new L AICA infarct complicated by pyelonephritis. Admitted to [facility] and then CIR 12/18-12/30/22. MRI 09/22/21 showed acute moderate size L AICA cerebellar infarct, underlying extensive chronic left MCA encephalomalacia related to 2021 infarct, left brainstem Wallerian degeneration and chronic left PICA cerebellar infarcts.      Cognitive-Linquistic Treatment   Treatment focused on Aphasia;Apraxia    Skilled Treatment No caregiver present to complete caregiver training. Resolved issue with pt's communication device access to TalkPath Therapy exercises. Pt selected some tasks to complete. Noted reduced frustration tolerance with errors, even with modification of task for errorless responses. Reduced demands, used visuals to support conversation re: pt's granddaughter and added content to pt's device for her to ask granddaughter about a movie she might enjoy. Pt made choices for lunch on her device; when attempting to facilitate/model for pt to make restaurant outing request, pt appeared frustrated with using her communication device.  Provided therapeutic break and generated handout with follow-up recommendations to share with family (aphasia resources ([employer] and other groups, [employer] support, and tips for cuing for apraxia). Provided handout to grandson who returned at end of pt's session.      Assessment / Recommendations / Plan   Plan Continue with current plan of care      Progression Toward Goals   Progression toward goals --   Goals partially met, pt d/c with max rehab potential  reached.             SLP Education - 03/08/22 1252     Education Details cuing for apraxia, aphasia resources    Person(s) Educated Patient;Other (comment)   grandson   Methods Handout    Comprehension Verbalized understanding              SLP Short Term Goals - 12/13/21 0924       SLP SHORT TERM GOAL #1   Title Pt will answer personally relevant Y/N questions in context using multimodal communication or AAC if necessary, 75% accuracy.    Time 10    Period --   sessions   Status Achieved      SLP SHORT TERM GOAL #2   Title Given verbal or visual prompt, pt will correctly ID written word/object/icon in field of 4-6 to communicate wants/needs with 75% accuracy given occasional mod A over 2 sessions    Baseline F:3 12/09/21    Time 10    Period --   visits   Status Not Met      SLP SHORT TERM GOAL #3   Title Pt will verbally approximate 3 significant words for 5/10 trials given occasional fading to rare mod A over 2 sessions    Time 10    Period --   sessions   Status Achieved              SLP Long Term Goals - 03/08/22 1805       SLP LONG TERM GOAL #1   Title Pt will use multimodal communication system with mod assist to express wants/needs given >2 choices over 2 sessions for 4/5 opportunities    Time 12    Period Weeks    Status Achieved      SLP LONG TERM GOAL #2   Title Caregiver will demonstrate appropriate use of supported conversation and multimodal supports to aid pt's comprehension and expression of personally relevant topics, over 4 sessions.    Time 6    Period Weeks    Status Partially Met      SLP LONG TERM GOAL #3   Title Caregiver will appropriately cue patient when apraxic/aphasic errors occurs for 4/5 opportunities over 2 sessions    Time 6    Period Weeks    Status Not Met      SLP LONG TERM GOAL #4   Title Caregiver will demonstrate ability to modify Lingraphica device with modified independence over 3 sessions.    Time 6     Period Weeks    Status Partially Met              Plan - 03/08/22 1253     Clinical Impression Statement Lindsey [name] presents with severe aphasia and severe verbal apraxia, chronic deficits exacerbated by new CVA in December 2022. Skilled ST sessions have focused on increasing pt's functional communication by using her AAC device to make simple requests (food, help, comfort) and communicate personal information (feelings, leisure choices,  pain). With mod-max cuing due to verbal apraxia, able to record device output in patient's voice for many icons. Use of device has also supportedPatient's ideomotor apraxia and perseveration interfere with her ability to use her device at times, however selection accuracy improves with partner-assisted scanning. Anticipate that patient will continue to require partner support on an ongoing basis to use her communication device effectively. Have focused most recent sessions and LTGs on caregiver training. Goals were partially met as caregiver did not attend session this date. Patient is discharged today with max rehab potential achieved.    Speech Therapy Frequency --   d/c   Duration --   d/c   Treatment/Interventions Compensatory strategies;Patient/family education;Functional tasks;Cueing hierarchy;Environmental controls;Cognitive reorganization;Multimodal communcation approach;Language facilitation;Compensatory techniques;Internal/external aids;SLP instruction and feedback;Other (comment)    Potential to Achieve Goals Fair    Potential Considerations Severity of impairments;Previous level of function    Consulted and Agree with Plan of Care Patient;Family member/caregiver    Family Member Consulted daughter             Patient will benefit from skilled therapeutic intervention in order to improve the following deficits and impairments:   Aphasia  Verbal apraxia    Problem List Patient Active Problem List   Diagnosis Date Noted   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[place]) 12/27/2021   Elevated AST (SGOT) 10/22/2021   Lupus anticoagulant positive 10/22/2021   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)    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[place])    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 ([place]) 09/24/2021   Cerebral embolism with cerebral infarction 09/23/2021   Pyelonephritis 09/19/2021   Pain due to onychomycosis of toenails of both feet 11/19/2020   Normocytic anemia 05/31/2020   Acute ischemic left MCA stroke ([place]) 04/30/2020   Right hemiplegia ([place]) 04/30/2020   Cerebrovascular accident ([place]) 04/21/2020   Atrial fibrillation with RVR ([place]) 04/21/2020   Essential hypertension 04/21/2020   Alcohol abuse 04/21/2020   Deneise Lever, [place], CCC-SLP Speech-Language Pathologist [phone]  Aliene Altes, [place] 03/08/2022, 6:06 PM  [place] MAIN [facility] SERVICES [address] [place], [place], [address] Phone: [phone]   Fax:  [phone]   Name: Lindsey [name] MRN: [id number] Date of Birth: [birth year]

## 2022-03-08 NOTE — Therapy (Signed)
[place] MAIN [facility] SERVICES [address] [place], [place], [address] Phone: [phone]   Fax:  [phone]  Occupational Therapy Treatment  Patient Details  Name: Lindsey [name] MRN: [id number] Date of Birth: Feb 21, 1956 Referring Provider (OT): Dr. 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 No pertinent surgical history.  There were no vitals filed for this visit.   Subjective Assessment - 03/08/22 1218     Subjective  Pt. did not wear the grace this morning.    Patient is accompanied by: Family member    Pertinent History Pt. is a 66 y.o. 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 Pt. has Right sided weakness, and receptive, and expressive aphasia. Pt. has had a previous CVA with right sided weakness in July 2021. PMHx includes: AFib, Renal Artery thrombosis, situational depression, HTN, Pyelnephritis, Seizure activity, COnstipation, AKI. Vitamin D deficiency, and urinary incontinence/    Currently in Pain? No/denies            Rationale for Evaluation and Treatment Rehabilitation  OT  TREATMENT     Manual Therapy:   Pt. tolerated soft tissue massage to the right scapular musculature. Pt. tolerated scapular mobilizations in elevation, depression, abduction, and rotation following moist heat modality to the right shoulder. Pt. tolerated soft tissue mobilizations for radius on ulna, and metacarpal spread stretches to decrease tightness, and prepare the UE, and hand for ROM and therapeutic Ex.Manual therapy was performed independent of, and in preparation for there. Ex.    Therapeutic Exercise:   Pt. tolerated PROM through all joint ranges of the RUE, and hand following moist heat modality, and manual therapy. Pt. performed self-ROM to the right MPs, PIPs, and DIPs.     Pt. was present without her shoulder harness brace today. Pt. presents with flexor tone, and tightness in the right shoulder. Pt. continues to respond well to the session with less tone, tightness, and increased ROM noted thrughout the RUE following ROM, and manual therapy. Pt. presented with increased tightness with right shoulder abduction, and flexion. Pt. continues to work on normalizing tone, and facilitating active volitional movement in the RUE, and hand to improve engagement of the RUE during ADLs, as well as improve transfers, and functional mobility for toileting.                          OT Education - 03/08/22  1219     Education Details RUE functioning    Person(s) Educated Patient    Methods Explanation;Verbal cues;Tactile cues;Demonstration    Comprehension Verbal cues required;Need further instruction;Returned demonstration;Tactile cues required;Verbalized understanding              OT Short Term Goals - 02/17/22 1032       OT SHORT TERM GOAL #1   Title Pt and caregiver will be independent with HEP for RUE    Baseline Eval: No current program. 10th visit: Caregivers  report being comfortable with exercises at home. 01/06/2022: Continue ongoing HEPs for UE. 30th visit:  Pt./caregivers to continue with ROM    Time 6    Period Weeks    Status On-going    Target Date 03/31/22               OT Long Term Goals - 02/17/22 1046       OT LONG TERM GOAL #1   Title Pt. will engage engage the RUE as a gross assist/atabilizer during ADLs 100% of the time    Baseline 30th visit: Pt. is initiating using her RUE as a gross stabilizer with maxA 01/06/2022: Pt. is starting to engage her RUE as a gross stabiliizer with increased cues. Eval: pt. does not currently engage her RUE during ADLs. 10th visit: Pt. continues to need to work towards using her RUE as a gross stabilizer.    Time 12    Status On-going    Target Date 03/31/22      OT LONG TERM GOAL #2   Title Pt. will perform UE dressing using one armed dressing techniques    Baseline Eval: MaxA, 10th visit: [place] 01/06/2022: min-modA 30th visit; min0-modA    Time 12    Period Weeks    Status On-going    Target Date 03/31/22      OT LONG TERM GOAL #3   Title Pt. will require minA to stand and hike pants    Baseline Eval: MaxA, 10th visit: [place] 01/06/2022: [place] 30th visit: ModA    Time 12    Period Weeks    Status On-going    Target Date 03/31/22      OT LONG TERM GOAL #4   Title Pt. will perform LE dressing with minA    Baseline Eval: MaxA 10th visit: Pt. requires MaxA for donning her brace, shoes, and socks. ModA with pants. 01/06/2022: ModA pants, MaxA shoes, socks, and brace 30th visit: ModA pants, MaxA shoes, socks, and brace.    Time 12    Period Weeks    Status On-going    Target Date 03/31/22      OT LONG TERM GOAL #5   Title Pt. will improve FOTO score by 2 pt. to reflect funcational change    Baseline Eval: FOTO score: 12, 10th visit: FOTO score 27 01/06/2022: 27 30th visit TBA at next visit secondary to no family available during the session.    Time 12    Period Weeks    Status On-going    Target Date 03/31/22      OT LONG TERM GOAL #6   Title Patient will transfer to bedside commode or  toilet with supervision.    Baseline 30th visit: min-modA. 01/06/2022: min-ModA    Time 4    Period Weeks    Status On-going    Target Date 03/31/22  Plan - 03/08/22 1221     Clinical Impression Statement Pt. was present without her shoulder harness brace today. Pt. presents with flexor tone, and tightness in the right shoulder. Pt. continues to respond well to the session with less tone, tightness, and increased ROM noted thrughout the RUE following ROM, and manual therapy. Pt. presented with increased tightness with right shoulder abduction, and flexion. Pt. continues to work on normalizing tone, and facilitating active volitional movement in the RUE, and hand to improve engagement of the RUE during ADLs, as well as improve transfers, and functional mobility for toileting.      OT Occupational Profile and History Detailed Assessment- Review of Records and additional review of physical, cognitive, psychosocial history related to current functional performance    Occupational performance deficits (Please refer to evaluation for details): Leisure;IADL's;ADL's    Body Structure / Function / Physical Skills ADL;IADL;FMC;ROM;UE functional use;Strength;Decreased knowledge of use of DME;Coordination    Rehab Potential Fair    Clinical Decision Making Several treatment options, min-mod task modification necessary    Comorbidities Affecting Occupational Performance: May have comorbidities impacting occupational performance    Modification or Assistance to Complete Evaluation  Min-Moderate modification of tasks or assist with assess necessary to complete eval    OT Frequency 2x / week    OT Duration 12 weeks    OT Treatment/Interventions Self-care/ADL training;Neuromuscular education;Electrical Stimulation;Patient/family education;Therapeutic activities;Passive range of motion;Therapist, nutritional;Therapeutic exercise;DME and/or AE instruction;Moist Heat    Consulted  and Agree with Plan of Care Patient             Patient will benefit from skilled therapeutic intervention in order to improve the following deficits and impairments:   Body Structure / Function / Physical Skills: ADL, IADL, FMC, ROM, UE functional use, Strength, Decreased knowledge of use of DME, Coordination       Visit Diagnosis: Muscle weakness (generalized)    Problem List Patient Active Problem List   Diagnosis Date Noted   Blood clotting disorder ([place]) 12/27/2021   Elevated AST (SGOT) 10/22/2021   Lupus anticoagulant positive 10/22/2021   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)    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[place])    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 ([place]) 09/24/2021   Cerebral embolism with cerebral infarction 09/23/2021   Pyelonephritis 09/19/2021   Pain due to onychomycosis of toenails of both feet 11/19/2020   Normocytic anemia 05/31/2020   Acute ischemic left MCA stroke ([place]) 04/30/2020   Right hemiplegia ([place]) 04/30/2020   Cerebrovascular accident ([place]) 04/21/2020   Atrial fibrillation with RVR ([place]) 04/21/2020   Essential hypertension 04/21/2020   Alcohol abuse 04/21/2020   Harrel Carina, MS, OTR/L  Harrel Carina, OT 03/08/2022, 12:25 PM  [place] MAIN [facility] SERVICES [address] [place], [place], [address] Phone: [phone]   Fax:  [phone]  Name: Lindsey [name] MRN: [id number] Date of Birth: [birth year]

## 2022-03-08 NOTE — Therapy (Signed)
[place] [facility] SERVICES [address] [place], [place], [address] Phone: [phone]   Fax:  [phone]  Physical Therapy Treatment  Patient Details  Name: Lindsey [name] MRN: [id number] Date of Birth: Dec 07, 1955 Referring Provider (PT): Ranell Patrick Clide Deutscher, MD   Encounter Date: 03/08/2022   PT End of Session - 03/08/22 0942     Visit Number 23    Number of Visits 41    Date for PT Re-Evaluation 05/03/22    Authorization Time Period Cert through [id number]; Recert [id number]    Progress Note Due on Visit 20    PT Start Time 1146    PT Stop Time 1229    PT Time Calculation (min) 43 min    Equipment Utilized During Treatment Gait belt    Activity Tolerance Patient limited by fatigue    Behavior During Therapy WFL for tasks assessed/performed             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 No pertinent surgical history.  There were no vitals filed for this visit.   Subjective Assessment - 03/08/22 0942     Subjective Patient reports no pain- States she was pleased with her performance- walking last visit.    Patient is accompained by: Family member   [name]   Pertinent History Pt was doing very well and was walking short distances wqith a cane prior to second stroke in december. At this point she is limited to wheelchair as her main means of mobility. Pt caregiver reports all detailed information. Pt has hesitation with the left lower extremity and significant weakness on the right lower extremity. Pt will take 2-3 steps when transfering. Pt caregiver assists with all  transfers.Pt has shower with ledge to steps over ledge when entering and utilizes bedside commode. Pt has 2 level home with chair lift for upstairs access. Pt would like to improve ransfers, improve neglect on her R side and improve her overall strength and mobility.Pt. is a 66 y.o. 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 Pt. has Right sided weakness, and receptive, and expressive aphasia. Pt. has had a previous CVA with right sided weakness in July 2021. PMHx includes: AFib, Renal Artery thrombosis, situational depression, HTN, Pyelnephritis, Seizure activity, COnstipation, AKI. Vitamin D deficiency, and urinary incontinence.    Limitations Standing;Walking;House hold activities    How long can you sit comfortably? n/a    How long can you stand comfortably? 2 min    How long can you walk comfortably? 3-4 steps    Patient Stated Goals Improve balance and functional movements    Currently in Pain? No/denies             INTERVENTIONS:    Manual therapy: Manual stretch to right hamstring in sitting (LE propped up on PT leg- Hold 30 sec x 4 knee flex stretch - hold 30 sec x 4 (measured at 110 deg)     Therapeutic Activities:   Sit to stand transfer training with eccentric  Stand to sit- Moderate verbal cues, tactile cues, and visual demonstration - including cues to scoot out to edge of chair, Min assist intermittent to position right LE, Reminders to push up using left arm rather than pulling up. Patient performed 15 reps in total with some brief intermittent rest. By the 12th-15th reps- Patient was able to stand with only SBA and less overall cues.   Static stand - trying to focus on weight shifting to right LE- Placing an airex pad under left LE and no UE support - Static stand x 3 trials- approx 1 min each trial.   Dynamic step tap onto 6" block x 12 reps each Leg.   Gait training:   Patient ambulated in  clinic on firm surface for approx 85 feet today using hemiwalker, close CGA and w/c follow- Utilizing mostly step to gait with some difficulty sequencing and 2-3 LOB requiring CGA to maintain balance.    Education provided throughout session via VC/TC and demonstration to facilitate movement at target joints and correct muscle activation for all testing and exercises performed. Rationale for Evaluation and Treatment Rehabilitation                    PT Education - 03/09/22 0818     Education Details Exercise technique    Person(s) Educated Patient    Methods Explanation;Demonstration;Tactile cues;Verbal cues    Comprehension Verbalized understanding;Returned demonstration;Verbal cues required;Tactile cues required;Need further instruction              PT Short Term Goals - 02/08/22 2210       PT SHORT TERM GOAL #1   Title Patient will be independent in home exercise program to improve strength/mobility for better functional independence with ADLs.    Baseline no formal HEp for LE/balance at this time; 02/08/2022=Patient continues to require VC for reminders and assist with hip march/knee ext (seated for strengthening); Reminders to continue to work on static stand at home.    Time 4    Period Weeks    Status New    Target Date 12/14/21               PT Long Term Goals - 02/17/22 0943       PT LONG TERM GOAL #1   Title Patient will increase FOTO score to equal to or greater than   41  to demonstrate statistically significant improvement in mobility and quality of life.    Baseline 16 on 2/14; 02/08/2022=Patient unable to complete today- No family present but will attempt again next visit available with family present to assist in completion; 02/17/2022= 40    Time 12    Period Weeks    Status On-going    Target Date 05/03/22      PT LONG TERM GOAL #3   Title Patient  will complete five times sit to stand test in < 30 seconds with UE assistance indicating an  increased LE strength and improved balance.    Baseline 58.47 s with UE assist and with A from PT for R LE positioning; 02/08/2022=54 sec with use of L UE support and Min physical assist to position right LE.    Time 12    Period Weeks    Status On-going    Target Date 05/03/22      PT LONG TERM GOAL #4   Title Patient will increase Berg Balance score by > 6 points to demonstrate decreased fall risk during functional activities.  Baseline 12 on 2/14; 02/08/2022= 13/56    Time 12    Period Weeks    Status On-going    Target Date 05/03/22      PT LONG TERM GOAL #5   Title Pt will transfer from seated position on table/chair to/from transport chair/WC in order to indicate improved transfer ability.    Baseline Requires Min A for LE positioning and to prevent LOB. 02/08/2022=Patient attempted to perform on her own- Unable to push herself up from chair- Very min assist to position right LE into more flexed position and VC for hand placement. Patient able to stand with min Assist with use of gait belt and VC to lean; 02/17/2022=Patient was able to transfer from transport w/c with some physical assist with Right to swing to pivot to side of chair. She was able to sit to stand today with only CGA and then stand pivot with right arm holding onto arm of chair. Only difficulty was stand to sit- Max VC not to plop into chair.    Time 12    Period Weeks    Status On-going    Target Date 05/03/22      PT LONG TERM GOAL #6   Title Patient will ambulate > 150 feet with CGA using Hemiwalker on level surfaces for improved household and short community distances.    Baseline 02/08/2022= 40 feet with use of HW, heavy CGA with max VC for gait sequencing and w/c follow. 5/18 = 85 feet with use of HW, close CGA    Time 12    Period Weeks    Status On-going    Target Date 05/03/22      PT LONG TERM GOAL #7   Title Pt will increase 10MWT by at least 0.13 m/s in order to demonstrate clinically significant improvement  in community ambulation.    Baseline 02/08/2022= 0.05 m/s using HW    Time 12    Period Weeks    Status New    Target Date 05/03/22                   Plan - 03/08/22 0943     Clinical Impression Statement Patient presents with good motivation for today's session focusing on safety training with transfers. Patient improved during visit initially requiring max cueing for safe transfer but with practice and repetition improved to able to perform tasks with SBA. She was more fatigued with gait today and unable to walk the distance during previous visit. She would benefit from additional skilled PT intervention to improve strength, balance and mobility    Personal Factors and Comorbidities Age;Comorbidity 1;Comorbidity 2;Comorbidity 3+;Time since onset of injury/illness/exacerbation;Other    Comorbidities AFib, Renal Artery thrombosis, situational depression, HTN, Pyelnephritis, Seizure activity, COnstipation, AKI    Examination-Activity Limitations Bathing;Bed Mobility;Carry;Locomotion Level;Dressing;Sit;Squat;Stairs;Stand;Toileting;Transfers    Examination-Participation Restrictions Church;Community Activity;Laundry    Stability/Clinical Decision Making Evolving/Moderate complexity    Rehab Potential Fair    PT Frequency 2x / week    PT Duration 12 weeks    PT Treatment/Interventions ADLs/Self Care Home Management;Gait training;Stair training;Therapeutic activities;Therapeutic exercise;Balance training;Neuromuscular re-education;Patient/family education;Wheelchair mobility training;Manual techniques;Energy conservation;Dry needling;Passive range of motion;Taping;Spinal Manipulations;Joint Manipulations    PT Next Visit Plan Gait training, Transfer training, LE strengthening as appropriate.    PT Home Exercise Plan HEP: general LE and postural exercises    Consulted and Agree with Plan of Care Patient             Patient will benefit from skilled  therapeutic intervention in order to  improve the following deficits and impairments:  Abnormal gait, Decreased activity tolerance, Decreased endurance, Decreased knowledge of use of DME, Decreased range of motion, Decreased strength, Hypomobility, Impaired flexibility, Difficulty walking, Decreased balance, Decreased coordination, Decreased knowledge of precautions, Decreased mobility, Decreased safety awareness  Visit Diagnosis: Muscle weakness (generalized)  Other lack of coordination  Abnormality of gait and mobility  Unsteadiness on feet  Other abnormalities of gait and mobility  Difficulty in walking, not elsewhere classified  Acute ischemic left MCA stroke [facility])     Problem List Patient Active Problem List   Diagnosis Date Noted   Blood clotting disorder ([place]) 12/27/2021   Elevated AST (SGOT) 10/22/2021   Lupus anticoagulant positive 10/22/2021   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)    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[place])    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 ([place]) 09/24/2021   Cerebral embolism with cerebral infarction 09/23/2021   Pyelonephritis 09/19/2021   Pain due to onychomycosis of toenails of both feet 11/19/2020   Normocytic anemia 05/31/2020   Acute ischemic left MCA stroke ([place]) 04/30/2020   Right hemiplegia ([place]) 04/30/2020   Cerebrovascular accident ([place]) 04/21/2020   Atrial fibrillation with RVR ([place]) 04/21/2020   Essential hypertension 04/21/2020   Alcohol abuse 04/21/2020    Lewis Moccasin, PT 03/09/2022, 8:44 AM  [place] MAIN [facility] SERVICES [place], [place], [address] Phone: [phone]   Fax:  [phone]  Name: Lindsey [name] MRN: [id number] Date of Birth: 1955-12-17

## 2022-03-10 ENCOUNTER — Ambulatory Visit: Payer: Medicare Other

## 2022-03-10 ENCOUNTER — Ambulatory Visit: Payer: Medicare Other | Admitting: Occupational Therapy

## 2022-03-10 ENCOUNTER — Encounter: Payer: Self-pay | Admitting: Occupational Therapy

## 2022-03-10 ENCOUNTER — Encounter: Payer: Medicare Other | Admitting: Speech Pathology

## 2022-03-10 DIAGNOSIS — R4701 Aphasia: Secondary | ICD-10-CM | POA: Diagnosis not present

## 2022-03-10 DIAGNOSIS — R2689 Other abnormalities of gait and mobility: Secondary | ICD-10-CM

## 2022-03-10 DIAGNOSIS — I63512 Cerebral infarction due to unspecified occlusion or stenosis of left middle cerebral artery: Secondary | ICD-10-CM

## 2022-03-10 DIAGNOSIS — R269 Unspecified abnormalities of gait and mobility: Secondary | ICD-10-CM

## 2022-03-10 DIAGNOSIS — R262 Difficulty in walking, not elsewhere classified: Secondary | ICD-10-CM

## 2022-03-10 DIAGNOSIS — R278 Other lack of coordination: Secondary | ICD-10-CM

## 2022-03-10 DIAGNOSIS — M6281 Muscle weakness (generalized): Secondary | ICD-10-CM

## 2022-03-10 DIAGNOSIS — R2681 Unsteadiness on feet: Secondary | ICD-10-CM

## 2022-03-10 NOTE — Therapy (Signed)
[place] MAIN [facility] SERVICES [address] [place], [place], [address] Phone: [phone]   Fax:  [phone]  Physical Therapy Treatment  Patient Details  Name: Lindsey [name] MRN: [id number] Date of Birth: Dec 17, 1955 Referring Provider (PT): Ranell Patrick Clide Deutscher, MD   Encounter Date: 03/10/2022   PT End of Session - 03/10/22 0936     Visit Number 24    Number of Visits 41    Date for PT Re-Evaluation 05/03/22    Authorization Time Period Cert through [id number]; Recert [id number]    Progress Note Due on Visit 20    PT Start Time 0930    PT Stop Time 1014    PT Time Calculation (min) 44 min    Equipment Utilized During Treatment Gait belt    Activity Tolerance Patient limited by fatigue    Behavior During Therapy WFL for tasks assessed/performed             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 No pertinent surgical history.  There were no vitals filed for this visit.   Subjective Assessment - 03/10/22 0935     Subjective Patient reports doing okay with no new concerns- States no falls and no pain.    Patient is accompained by: Family member   [name]   Pertinent History Pt was doing very well and was walking short distances wqith a cane prior to second stroke in december. At this point she is limited to wheelchair as her main means of mobility. Pt caregiver reports all detailed information. Pt has hesitation with the left lower extremity and significant weakness on the right lower extremity. Pt will take 2-3 steps when transfering. Pt caregiver assists with all transfers.Pt has  shower with ledge to steps over ledge when entering and utilizes bedside commode. Pt has 2 level home with chair lift for upstairs access. Pt would like to improve ransfers, improve neglect on her R side and improve her overall strength and mobility.Pt. is a 66 y.o. 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 Pt. has Right sided weakness, and receptive, and expressive aphasia. Pt. has had a previous CVA with right sided weakness in July 2021. PMHx includes: AFib, Renal Artery thrombosis, situational depression, HTN, Pyelnephritis, Seizure activity, COnstipation, AKI. Vitamin D deficiency, and urinary incontinence.    Limitations Standing;Walking;House hold activities    How long can you sit comfortably? n/a    How long can you stand comfortably? 2 min    How long can you walk comfortably? 3-4 steps    Patient Stated Goals Improve balance and functional movements    Currently in Pain? No/denies             INTERVENTIONS:    Therex:  -LAQ (3lb on left LE) and no weight on right- 2 sets of 15 reps -Hamstring curl- blue TB on left; Yellow TB on right 2 sets of 12 reps -Hip add with ball squeeze (2 sets of 10 reps - 5 sec hold)  Therapeutic activity:   Stand pivot transfer-Foscuing on each step process- - scoot out to edge - position feet (using left LE over top of right ankle to assist in bending knee into correct position)  - Hand position - Leaning forward while looking straight ahead - Pushing up without excessive pushing back with legs into chair  Patient performed several SPT X 8 or so with inconsistent results- ranging from max VC and some physical assist to very minimal VC with only CGA.   Gait training:   Patient ambulated in clinic on firm surface for approx 70 feet today using hemiwalker, close CGA and w/c follow- step to gait with minimal to no cues and no LOB today.       Education provided  throughout session via VC/TC and demonstration to facilitate movement at target joints and correct muscle activation for all testing and exercises performed. Rationale for Evaluation and Treatment Rehabilitation                               PT Education - 03/10/22 0936     Education Details Exercise technique    Person(s) Educated Patient    Methods Explanation;Demonstration;Tactile cues;Verbal cues    Comprehension Verbalized understanding;Returned demonstration;Verbal cues required;Need further instruction;Tactile cues required              PT Short Term Goals - 02/08/22 2210       PT SHORT TERM GOAL #1   Title Patient will be independent in home exercise program to improve strength/mobility for better functional independence with ADLs.    Baseline no formal HEp for LE/balance at this time; 02/08/2022=Patient continues to require VC for reminders and assist with hip march/knee ext (seated for strengthening); Reminders to continue to work on static stand at home.    Time 4    Period Weeks    Status New    Target Date 12/14/21               PT Long Term Goals - 02/17/22 0943       PT LONG TERM GOAL #1   Title Patient will increase FOTO score to equal to or greater than   41  to demonstrate statistically significant improvement in mobility and quality of life.    Baseline 16 on 2/14; 02/08/2022=Patient unable to complete today- No family present but will attempt again next visit available with family present to assist in completion; 02/17/2022= 40    Time 12    Period Weeks    Status On-going    Target Date 05/03/22      PT LONG TERM GOAL #3   Title Patient  will complete five times sit to stand test in < 30 seconds with UE assistance indicating an increased LE strength and improved balance.    Baseline 58.47 s with UE assist and with A from PT for R LE positioning; 02/08/2022=54 sec with use of L UE support and Min physical assist to position right LE.     Time 12    Period Weeks    Status On-going    Target Date 05/03/22      PT LONG TERM GOAL #4   Title Patient will increase Berg Balance score by > 6 points to demonstrate decreased fall risk during functional activities.    Baseline 12 on 2/14; 02/08/2022= 13/56    Time 12    Period Weeks    Status On-going  Target Date 05/03/22      PT LONG TERM GOAL #5   Title Pt will transfer from seated position on table/chair to/from transport chair/WC in order to indicate improved transfer ability.    Baseline Requires Min A for LE positioning and to prevent LOB. 02/08/2022=Patient attempted to perform on her own- Unable to push herself up from chair- Very min assist to position right LE into more flexed position and VC for hand placement. Patient able to stand with min Assist with use of gait belt and VC to lean; 02/17/2022=Patient was able to transfer from transport w/c with some physical assist with Right to swing to pivot to side of chair. She was able to sit to stand today with only CGA and then stand pivot with right arm holding onto arm of chair. Only difficulty was stand to sit- Max VC not to plop into chair.    Time 12    Period Weeks    Status On-going    Target Date 05/03/22      PT LONG TERM GOAL #6   Title Patient will ambulate > 150 feet with CGA using Hemiwalker on level surfaces for improved household and short community distances.    Baseline 02/08/2022= 40 feet with use of HW, heavy CGA with max VC for gait sequencing and w/c follow. 5/18 = 85 feet with use of HW, close CGA    Time 12    Period Weeks    Status On-going    Target Date 05/03/22      PT LONG TERM GOAL #7   Title Pt will increase by at least 0.13 m/s in order to demonstrate clinically significant improvement in community ambulation.    Baseline 02/08/2022= 0.05 m/s using HW    Time 12    Period Weeks    Status New    Target Date 05/03/22                   Plan - 03/10/22 0940     Clinical  Impression Statement Patient performed well in session today- Improving overall with transfer ability with decreased physical assist yet continues to require cues for safe sit to stand transfers yet once she is up - able to take several small steps using hemiwalker today to make a perpendicular transfer. She also required less overall gait cues for sequencing today. Patient would benefit from additional skilled PT intervention to improve strength, balance and mobility for improved quality of life and decreased risk of falling.    Personal Factors and Comorbidities Age;Comorbidity 1;Comorbidity 2;Comorbidity 3+;Time since onset of injury/illness/exacerbation;Other    Comorbidities AFib, Renal Artery thrombosis, situational depression, HTN, Pyelnephritis, Seizure activity, COnstipation, AKI    Examination-Activity Limitations Bathing;Bed Mobility;Carry;Locomotion Level;Dressing;Sit;Squat;Stairs;Stand;Toileting;Transfers    Examination-Participation Restrictions Church;Community Activity;Laundry    Stability/Clinical Decision Making Evolving/Moderate complexity    Rehab Potential Fair    PT Frequency 2x / week    PT Duration 12 weeks    PT Treatment/Interventions ADLs/Self Care Home Management;Gait training;Stair training;Therapeutic activities;Therapeutic exercise;Balance training;Neuromuscular re-education;Patient/family education;Wheelchair mobility training;Manual techniques;Energy conservation;Dry needling;Passive range of motion;Taping;Spinal Manipulations;Joint Manipulations    PT Next Visit Plan Gait training, Transfer training, LE strengthening as appropriate.    PT Home Exercise Plan HEP: general LE and postural exercises    Consulted and Agree with Plan of Care Patient             Patient will benefit from skilled therapeutic intervention in order to improve the following deficits and impairments:  Abnormal gait, Decreased activity tolerance, Decreased endurance, Decreased knowledge of  use of DME, Decreased range of motion, Decreased strength, Hypomobility, Impaired flexibility, Difficulty walking, Decreased balance, Decreased coordination, Decreased knowledge of precautions, Decreased mobility, Decreased safety awareness  Visit Diagnosis: Muscle weakness (generalized)  Other lack of coordination  Abnormality of gait and mobility  Unsteadiness on feet  Other abnormalities of gait and mobility  Difficulty in walking, not elsewhere classified  Acute ischemic left MCA stroke [facility])     Problem List Patient Active Problem List   Diagnosis Date Noted   Blood clotting disorder ([place]) 12/27/2021   Elevated AST (SGOT) 10/22/2021   Lupus anticoagulant positive 10/22/2021   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)    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[place])    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 [place], [place], [address] Phone: [phone]   Fax:  [phone]  Name: Lindsey [name] MRN: [id number] Date of Birth: May 17, 1956

## 2022-03-10 NOTE — Therapy (Signed)
[place] MAIN [facility] SERVICES [place], [place], [address] Phone: [phone]   Fax:  [phone]  Occupational Therapy Treatment  Patient Details  Name: Lindsey [name] MRN: [id number] Date of Birth: November 18, 1955 Referring Provider (OT): Dr. 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 No pertinent surgical history.  There were no vitals filed for this visit.   Subjective Assessment - 03/10/22 1249     Subjective  Pt. brought the shoulder harness brace tangled up in her bag.    Patient is accompanied by: Family member    Pertinent History Pt. is a 66 y.o. 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 Pt. has Right sided weakness, and receptive, and expressive aphasia. Pt. has had a previous CVA with right sided weakness in July 2021. PMHx includes: AFib, Renal Artery thrombosis, situational depression, HTN, Pyelnephritis, Seizure activity, COnstipation, AKI. Vitamin D deficiency, and urinary incontinence/    Currently in Pain? No/denies             Rationale for Evaluation and Treatment Rehabilitation  OT TREATMENT     Manual Therapy:   Pt. tolerated soft tissue massage to the right scapular musculature. Pt. tolerated scapular mobilizations in elevation, depression, abduction, and rotation following moist heat modality to the right shoulder. Pt. tolerated soft tissue mobilizations for radius on ulna, and metacarpal spread stretches to decrease tightness, and prepare the UE, and hand for ROM and therapeutic Ex.Manual therapy was performed independent of, and in preparation for there. Ex.    Therapeutic Exercise:   Pt. tolerated PROM through all joint ranges of the RUE, and hand following moist heat modality, and manual therapy. Pt. education was provided about self-ROM to the right MPs, PIPs, and DIPs. Pt. Required hand over hand assist to perform self-ROM.  Pt. worked on [employer], and reciprocal motion using the UBE while seated for 3 min. With no resistance. Constant monitoring was provided, along with support, and an ACE wrap in place to the right hand.   Pt. presents with flexor tone, and tightness. Pt. continues to respond well to the session with less tone, tightness, and increased ROM noted thrughout the RUE following ROM, and manual therapy. Pt. had limited tolerance for the UBE today. Pt. presented with increased tightness with right shoulder abduction, and flexion. Pt. continues to work on normalizing tone, and facilitating active  volitional movement in the RUE, and hand to improve engagement of the RUE during ADLs, as well as improve transfers, and functional mobility for toileting.                           OT Education - 03/10/22 1250     Education Details RUE functioning    Person(s) Educated Patient    Methods Explanation;Verbal cues;Tactile cues;Demonstration    Comprehension Verbal cues required;Need further instruction;Returned demonstration;Tactile cues required;Verbalized  understanding              OT Short Term Goals - 02/17/22 1032       OT SHORT TERM GOAL #1   Title Pt and caregiver will be independent with HEP for RUE    Baseline Eval: No current program. 10th visit: Caregivers  report being comfortable with exercises at home. 01/06/2022: Continue ongoing HEPs for UE. 30th visit: Pt./caregivers to continue with ROM    Time 6    Period Weeks    Status On-going    Target Date 03/31/22               OT Long Term Goals - 02/17/22 1046       OT LONG TERM GOAL #1   Title Pt. will engage engage the RUE as a gross assist/atabilizer during ADLs 100% of the time    Baseline 30th visit: Pt. is initiating using her RUE as a gross stabilizer with maxA 01/06/2022: Pt. is starting to engage her RUE as a gross stabiliizer with increased cues. Eval: pt. does not currently engage her RUE during ADLs. 10th visit: Pt. continues to need to work towards using her RUE as a gross stabilizer.    Time 12    Status On-going    Target Date 03/31/22      OT LONG TERM GOAL #2   Title Pt. will perform UE dressing using one armed dressing techniques    Baseline Eval: MaxA, 10th visit: [place] 01/06/2022: min-modA 30th visit; min0-modA    Time 12    Period Weeks    Status On-going    Target Date 03/31/22      OT LONG TERM GOAL #3   Title Pt. will require minA to stand and hike pants    Baseline Eval: MaxA, 10th visit: [place] 01/06/2022: [place] 30th visit: ModA    Time 12    Period Weeks    Status On-going    Target Date 03/31/22      OT LONG TERM GOAL #4   Title Pt. will perform LE dressing with minA    Baseline Eval: MaxA 10th visit: Pt. requires MaxA for donning her brace, shoes, and socks. ModA with pants. 01/06/2022: ModA pants, MaxA shoes, socks, and brace 30th visit: ModA pants, MaxA shoes, socks, and brace.    Time 12    Period Weeks    Status On-going    Target Date 03/31/22      OT LONG TERM GOAL #5   Title Pt. will improve FOTO score by 2 pt. to reflect  funcational change    Baseline Eval: FOTO score: 12, 10th visit: FOTO score 27 01/06/2022: 27 30th visit TBA at next visit secondary to no family available during the session.    Time 12    Period Weeks    Status On-going    Target Date 03/31/22      OT LONG TERM GOAL #6   Title Patient will transfer  to bedside commode or toilet with supervision.    Baseline 30th visit: min-modA. 01/06/2022: min-ModA    Time 4    Period Weeks    Status On-going    Target Date 03/31/22                   Plan - 03/10/22 1251     Clinical Impression Statement Pt. presents with flexor tone, and tightness. Pt. continues to respond well to the session with less tone, tightness, and increased ROM noted thrughout the RUE following ROM, and manual therapy. Pt. had limited tolerance for the UBE today. Pt. presented with increased tightness with right shoulder abduction, and flexion. Pt. continues to work on normalizing tone, and facilitating active volitional movement in the RUE, and hand to improve engagement of the RUE during ADLs, as well as improve transfers, and functional mobility for toileting.      OT Occupational Profile and History Detailed Assessment- Review of Records and additional review of physical, cognitive, psychosocial history related to current functional performance    Occupational performance deficits (Please refer to evaluation for details): Leisure;IADL's;ADL's    Body Structure / Function / Physical Skills ADL;IADL;FMC;ROM;UE functional use;Strength;Decreased knowledge of use of DME;Coordination    Rehab Potential Fair    Clinical Decision Making Several treatment options, min-mod task modification necessary    Comorbidities Affecting Occupational Performance: May have comorbidities impacting occupational performance    Modification or Assistance to Complete Evaluation  Min-Moderate modification of tasks or assist with assess necessary to complete eval    OT Frequency 2x / week     OT Duration 12 weeks    OT Treatment/Interventions Self-care/ADL training;Neuromuscular education;Electrical Stimulation;Patient/family education;Therapeutic activities;Passive range of motion;Therapist, nutritional;Therapeutic exercise;DME and/or AE instruction;Moist Heat    Consulted and Agree with Plan of Care Patient             Patient will benefit from skilled therapeutic intervention in order to improve the following deficits and impairments:   Body Structure / Function / Physical Skills: ADL, IADL, FMC, ROM, UE functional use, Strength, Decreased knowledge of use of DME, Coordination       Visit Diagnosis: Muscle weakness (generalized)    Problem List Patient Active Problem List   Diagnosis Date Noted   Blood clotting disorder ([place]) 12/27/2021   Elevated AST (SGOT) 10/22/2021   Lupus anticoagulant positive 10/22/2021   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)    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[place])    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 ([place]) 09/24/2021   Cerebral embolism with cerebral infarction 09/23/2021   Pyelonephritis 09/19/2021   Pain due to onychomycosis of toenails of both feet 11/19/2020   Normocytic anemia 05/31/2020   Acute ischemic left MCA stroke ([place]) 04/30/2020   Right hemiplegia ([place]) 04/30/2020   Cerebrovascular accident ([place]) 04/21/2020   Atrial fibrillation with RVR ([place]) 04/21/2020   Essential hypertension 04/21/2020   Alcohol abuse 04/21/2020   Harrel Carina, MS, OTR/L  Harrel Carina, OT 03/10/2022, 12:59 PM  [place] MAIN [facility] SERVICES [address] [place], [place], [address] Phone: [phone]   Fax:  [phone]  Name: Lindsey [name] MRN: [id number] Date of Birth: 12-Jul-1956

## 2022-03-15 ENCOUNTER — Encounter: Payer: Medicare Other | Admitting: Speech Pathology

## 2022-03-15 ENCOUNTER — Ambulatory Visit: Payer: Medicare Other | Admitting: Occupational Therapy

## 2022-03-15 DIAGNOSIS — R262 Difficulty in walking, not elsewhere classified: Secondary | ICD-10-CM

## 2022-03-15 DIAGNOSIS — R4701 Aphasia: Secondary | ICD-10-CM | POA: Diagnosis not present

## 2022-03-15 DIAGNOSIS — M6281 Muscle weakness (generalized): Secondary | ICD-10-CM

## 2022-03-15 NOTE — Therapy (Signed)
[place] [facility] MAIN [facility] SERVICES [address] [place], [place], [address] Phone: [phone]   Fax:  [phone]  Occupational Therapy Treatment  Patient Details  Name: Lindsey [name] MRN: [id number] Date of Birth: 1956/09/24 Referring Provider (OT): Dr. 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  There were no vitals filed for this visit.   Subjective Assessment - 03/15/22 1018     Subjective  Pt reports she forgot her brace today.    Patient is accompanied by: Family member    Pertinent History Pt. is a 66 y.o. 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 Pt. has Right sided weakness, and receptive, and expressive aphasia. Pt. has had a previous CVA with right sided weakness in July 2021. PMHx includes: AFib, Renal Artery thrombosis, situational depression, HTN, Pyelnephritis, Seizure activity, COnstipation, AKI. Vitamin D deficiency, and urinary incontinence/    Limitations Right side weakness, reptive, and expressive ahasia     Patient Stated Goals To be able to dress herself.    Currently in Pain? No/denies    Pain Onset More than a month ago             Manual Therapy: Pt tolerated soft tissue massage to the right scapular musculature. Pt. tolerated scapular mobilizations in elevation, depression, abduction, and rotation following moist heat modality to the right shoulder. Manual therapy was performed independent of, and in preparation for there. Ex.    Therapeutic Exercise: Pt tolerated PROM through all joint ranges of the RUE, and hand following moist heat modality, and manual therapy. Pt. Required hand over hand assist to perform self-ROM, instructed not to pick up R arm by pulling on wrist. Pt worked on BUE strengthening and reciprocal motion using the UBE while seated for 8 min, alternating forward and backward with no resistance. Constant monitoring was provided, along with support, and an ACE wrap in place to the right hand.              OT Education - 03/15/22 1018     Education Details self-ROM HEP    Person(s) Educated Patient    Methods Explanation;Verbal cues;Tactile cues;Demonstration    Comprehension Verbal cues required;Need further instruction;Returned demonstration;Tactile cues required;Verbalized understanding              OT Short Term Goals - 02/17/22 1032  OT SHORT TERM GOAL #1   Title Pt and caregiver will be independent with HEP for RUE    Baseline Eval: No current program. 10th visit: Caregivers  report being comfortable with exercises at home. 01/06/2022: Continue ongoing HEPs for UE. 30th visit: Pt./caregivers to continue with ROM    Time 6    Period Weeks    Status On-going    Target Date 03/31/22               OT Long Term Goals - 02/17/22 1046       OT LONG TERM GOAL #1   Title Pt. will engage engage the RUE as a gross assist/atabilizer during ADLs 100% of the time    Baseline 30th visit: Pt. is initiating using her RUE as a gross stabilizer  with maxA 01/06/2022: Pt. is starting to engage her RUE as a gross stabiliizer with increased cues. Eval: pt. does not currently engage her RUE during ADLs. 10th visit: Pt. continues to need to work towards using her RUE as a gross stabilizer.    Time 12    Status On-going    Target Date 03/31/22      OT LONG TERM GOAL #2   Title Pt. will perform UE dressing using one armed dressing techniques    Baseline Eval: MaxA, 10th visit: [place] 01/06/2022: min-modA 30th visit; min0-modA    Time 12    Period Weeks    Status On-going    Target Date 03/31/22      OT LONG TERM GOAL #3   Title Pt. will require minA to stand and hike pants    Baseline Eval: MaxA, 10th visit: [place] 01/06/2022: [place] 30th visit: ModA    Time 12    Period Weeks    Status On-going    Target Date 03/31/22      OT LONG TERM GOAL #4   Title Pt. will perform LE dressing with minA    Baseline Eval: MaxA 10th visit: Pt. requires MaxA for donning her brace, shoes, and socks. ModA with pants. 01/06/2022: ModA pants, MaxA shoes, socks, and brace 30th visit: ModA pants, MaxA shoes, socks, and brace.    Time 12    Period Weeks    Status On-going    Target Date 03/31/22      OT LONG TERM GOAL #5   Title Pt. will improve FOTO score by 2 pt. to reflect funcational change    Baseline Eval: FOTO score: 12, 10th visit: FOTO score 27 01/06/2022: 27 30th visit TBA at next visit secondary to no family available during the session.    Time 12    Period Weeks    Status On-going    Target Date 03/31/22      OT LONG TERM GOAL #6   Title Patient will transfer to bedside commode or toilet with supervision.    Baseline 30th visit: min-modA. 01/06/2022: min-ModA    Time 4    Period Weeks    Status On-going    Target Date 03/31/22                   Plan - 03/15/22 1019     Clinical Impression Statement Pt arrived late and forgot her brace today - reeducated pt and caregiver on importance of wearing and frequency. Pt. presents with  flexor tone, and tightness. Pt. continues to respond well to the session with less tone, tightness, and increased ROM noted thrughout the RUE following ROM, and manual therapy.  Pt. had increased tolerance for the UBE today. Pt. continues to work on normalizing tone, and facilitating active volitional movement in the RUE, and hand to improve engagement of the RUE during ADLs, as well as improve transfers, and functional mobility for toileting.    OT Occupational Profile and History Detailed Assessment- Review of Records and additional review of physical, cognitive, psychosocial history related to current functional performance    Occupational performance deficits (Please refer to evaluation for details): Leisure;IADL's;ADL's    Body Structure / Function / Physical Skills ADL;IADL;FMC;ROM;UE functional use;Strength;Decreased knowledge of use of DME;Coordination    Rehab Potential Fair    Clinical Decision Making Several treatment options, min-mod task modification necessary    Comorbidities Affecting Occupational Performance: May have comorbidities impacting occupational performance    Modification or Assistance to Complete Evaluation  Min-Moderate modification of tasks or assist with assess necessary to complete eval    OT Frequency 2x / week    OT Duration 12 weeks    OT Treatment/Interventions Self-care/ADL training;Neuromuscular education;Electrical Stimulation;Patient/family education;Therapeutic activities;Passive range of motion;Therapist, nutritional;Therapeutic exercise;DME and/or AE instruction;Moist Heat    Consulted and Agree with Plan of Care Patient             Patient will benefit from skilled therapeutic intervention in order to improve the following deficits and impairments:   Body Structure / Function / Physical Skills: ADL, IADL, FMC, ROM, UE functional use, Strength, Decreased knowledge of use of DME, Coordination       Visit Diagnosis: Muscle weakness  (generalized)  Difficulty in walking, not elsewhere classified    Problem List Patient Active Problem List   Diagnosis Date Noted   Blood clotting disorder ([place]) 12/27/2021   Elevated AST (SGOT) 10/22/2021   Lupus anticoagulant positive 10/22/2021   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)    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 OTR/L  03/15/22, 12:16 PM  ascom [phone]'   [place], [place], [address] Phone: [phone]   Fax:  [phone]  Name: Lajla Hilbun MRN: [id number] Date of Birth: [birth year]

## 2022-03-17 ENCOUNTER — Encounter: Payer: Self-pay | Admitting: Occupational Therapy

## 2022-03-17 ENCOUNTER — Encounter: Payer: Self-pay | Admitting: Physical Therapy

## 2022-03-17 ENCOUNTER — Ambulatory Visit: Payer: Medicare Other | Admitting: Occupational Therapy

## 2022-03-17 ENCOUNTER — Ambulatory Visit: Payer: Medicare Other | Admitting: Physical Therapy

## 2022-03-17 ENCOUNTER — Encounter: Payer: Medicare Other | Admitting: Speech Pathology

## 2022-03-17 DIAGNOSIS — M6281 Muscle weakness (generalized): Secondary | ICD-10-CM

## 2022-03-17 DIAGNOSIS — R4701 Aphasia: Secondary | ICD-10-CM | POA: Diagnosis not present

## 2022-03-17 DIAGNOSIS — R262 Difficulty in walking, not elsewhere classified: Secondary | ICD-10-CM

## 2022-03-17 DIAGNOSIS — R278 Other lack of coordination: Secondary | ICD-10-CM

## 2022-03-17 DIAGNOSIS — R269 Unspecified abnormalities of gait and mobility: Secondary | ICD-10-CM

## 2022-03-17 DIAGNOSIS — I63542 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: Secondary | ICD-10-CM

## 2022-03-17 DIAGNOSIS — R2681 Unsteadiness on feet: Secondary | ICD-10-CM

## 2022-03-17 DIAGNOSIS — R2689 Other abnormalities of gait and mobility: Secondary | ICD-10-CM

## 2022-03-17 NOTE — Therapy (Signed)
[place] MAIN [facility] SERVICES [place], [place], [address] Phone: [phone]   Fax:  [phone]  Occupational Therapy Treatment  Patient Details  Name: Lindsey [name] MRN: [id number] Date of Birth: [birth year] Referring Provider (OT): Dr. 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 No pertinent surgical history.  There were no vitals filed for this visit.   Subjective Assessment - 03/17/22 1005     Patient is accompanied by: Family member    Pertinent History Pt. is a 66 y.o. 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 Pt. has Right sided weakness, and receptive, and expressive aphasia. Pt. has had a previous CVA with right sided weakness in July 2021. PMHx includes: AFib, Renal Artery thrombosis, situational depression, HTN, Pyelnephritis, Seizure activity, COnstipation, AKI. Vitamin D deficiency, and urinary incontinence/    Limitations Right side weakness, reptive, and expressive ahasia    Patient Stated Goals To be able to dress  herself.    Pain Onset More than a month ago               Therapeutic Exercise Pt tolerated PROM through all joint ranges of the RUE, and hand. Pt. Required hand over hand assist to perform self-ROM, instructed not to pick up R arm by pulling on wrist. Good recall of hand /wrist self-ROM. Pt worked on [employer] and reciprocal motion using the UBE while seated for 15 min, alternating forward and backward 5 min + 5 min with no resistance, last 5 min level 2 resistance. Constant monitoring was provided, along with support, and an ACE wrap in place to the right hand.       OT Education - 03/17/22 1005     Education Details brace education    Person(s) Educated Patient    Methods Explanation;Verbal cues;Tactile cues;Demonstration    Comprehension Verbal cues required;Need further instruction;Returned demonstration;Tactile cues required;Verbalized understanding              OT Short Term Goals - 02/17/22 1032       OT SHORT TERM GOAL #1   Title Pt and caregiver will be independent with HEP for RUE    Baseline Eval: No current program. 10th visit: Caregivers  report being comfortable with exercises at home. 01/06/2022: Continue ongoing HEPs for UE. 30th visit: Pt./caregivers to continue with ROM    Time 6  Period Weeks    Status On-going    Target Date 03/31/22               OT Long Term Goals - 02/17/22 1046       OT LONG TERM GOAL #1   Title Pt. will engage engage the RUE as a gross assist/atabilizer during ADLs 100% of the time    Baseline 30th visit: Pt. is initiating using her RUE as a gross stabilizer with maxA 01/06/2022: Pt. is starting to engage her RUE as a gross stabiliizer with increased cues. Eval: pt. does not currently engage her RUE during ADLs. 10th visit: Pt. continues to need to work towards using her RUE as a gross stabilizer.    Time 12    Status On-going    Target Date 03/31/22      OT LONG TERM GOAL #2   Title Pt. will perform UE  dressing using one armed dressing techniques    Baseline Eval: MaxA, 10th visit: ModA 01/06/2022: min-modA 30th visit; min0-modA    Time 12    Period Weeks    Status On-going    Target Date 03/31/22      OT LONG TERM GOAL #3   Title Pt. will require minA to stand and hike pants    Baseline Eval: MaxA, 10th visit: ModA 01/06/2022: ModA 30th visit: ModA    Time 12    Period Weeks    Status On-going    Target Date 03/31/22      OT LONG TERM GOAL #4   Title Pt. will perform LE dressing with minA    Baseline Eval: MaxA 10th visit: Pt. requires MaxA for donning her brace, shoes, and socks. ModA with pants. 01/06/2022: ModA pants, MaxA shoes, socks, and brace 30th visit: ModA pants, MaxA shoes, socks, and brace.    Time 12    Period Weeks    Status On-going    Target Date 03/31/22      OT LONG TERM GOAL #5   Title Pt. will improve FOTO score by 2 pt. to reflect funcational change    Baseline Eval: FOTO score: 12, 10th visit: FOTO score 27 01/06/2022: 27 30th visit TBA at next visit secondary to no family available during the session.    Time 12    Period Weeks    Status On-going    Target Date 03/31/22      OT LONG TERM GOAL #6   Title Patient will transfer to bedside commode or toilet with supervision.    Baseline 30th visit: min-modA. 01/06/2022: min-ModA    Time 4    Period Weeks    Status On-going    Target Date 03/31/22                   Plan - 03/17/22 1010     Clinical Impression Statement Pt tolerates increased resistance and time on UBE. Deomstrates self-ROM of hand, cues for proper shoulder ROM.  Pt. continues to work on normalizing tone, and facilitating active volitional movement in the RUE, and hand to improve engagement of the RUE during ADLs, as well as improve transfers, and functional mobility for toileting.    OT Occupational Profile and History Detailed Assessment- Review of Records and additional review of physical, cognitive, psychosocial history related  to current functional performance    Occupational performance deficits (Please refer to evaluation for details): Leisure;IADL's;ADL's    Body Structure / Function / Physical Skills ADL;IADL;FMC;ROM;UE functional use;Strength;Decreased knowledge of use  of DME;Coordination    Rehab Potential Fair    Clinical Decision Making Several treatment options, min-mod task modification necessary    Comorbidities Affecting Occupational Performance: May have comorbidities impacting occupational performance    Modification or Assistance to Complete Evaluation  Min-Moderate modification of tasks or assist with assess necessary to complete eval    OT Frequency 2x / week    OT Duration 12 weeks    OT Treatment/Interventions Self-care/ADL training;Neuromuscular education;Electrical Stimulation;Patient/family education;Therapeutic activities;Passive range of motion;Building services engineer;Therapeutic exercise;DME and/or AE instruction;Moist Heat    Consulted and Agree with Plan of Care Patient             Patient will benefit from skilled therapeutic intervention in order to improve the following deficits and impairments:   Body Structure / Function / Physical Skills: ADL, IADL, FMC, ROM, UE functional use, Strength, Decreased knowledge of use of DME, Coordination       Visit Diagnosis: Muscle weakness (generalized)  Other lack of coordination  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 [facility])    Problem List 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 OTR/L  03/17/22, 11:09 AM  ascom [phone]  [facility] MAIN [facility] SERVICES [address] [place], [place], [address] Phone: [phone]   Fax:  [phone]  Name: Lindsey [name] MRN: [id number] Date of Birth: Jan 14, 1956

## 2022-03-17 NOTE — Therapy (Signed)
[place] MAIN [facility] SERVICES [address] [place], [place], [address] Phone: [phone]   Fax:  [phone]  Physical Therapy Treatment  Patient Details  Name: Lindsey [name] MRN: [id number] Date of Birth: 05/05/1956 Referring Provider (PT): Ranell Patrick Clide Deutscher, MD   Encounter Date: 03/17/2022   PT End of Session - 03/17/22 0942     Visit Number 25    Number of Visits 41    Date for PT Re-Evaluation 05/03/22    Authorization Time Period Cert through [id number]; Recert [id number]    Progress Note Due on Visit 20    PT Start Time 0933    PT Stop Time 1015    PT Time Calculation (min) 42 min    Equipment Utilized During Treatment Gait belt    Activity Tolerance Patient limited by fatigue    Behavior During Therapy WFL for tasks assessed/performed             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 No pertinent surgical history.  There were no vitals filed for this visit.   Subjective Assessment - 03/17/22 0940     Subjective Patient reports doing okay. No pain reported. She denies any new falls. She reports doing exercises at home.    Patient is accompained by: Family member   [name]   Pertinent History Pt was doing very well and was walking short distances wqith a cane prior to second stroke in december. At this point she is limited to wheelchair as her main means of mobility. Pt caregiver reports all detailed information. Pt has hesitation with the left lower extremity and significant weakness on the right lower extremity. Pt will take 2-3 steps when transfering. Pt caregiver  assists with all transfers.Pt has shower with ledge to steps over ledge when entering and utilizes bedside commode. Pt has 2 level home with chair lift for upstairs access. Pt would like to improve ransfers, improve neglect on her R side and improve her overall strength and mobility.Pt. is a 66 y.o. 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 Pt. has Right sided weakness, and receptive, and expressive aphasia. Pt. has had a previous CVA with right sided weakness in July 2021. PMHx includes: AFib, Renal Artery thrombosis, situational depression, HTN, Pyelnephritis, Seizure activity, COnstipation, AKI. Vitamin D deficiency, and urinary incontinence.    Limitations Standing;Walking;House hold activities    How long can you sit comfortably? n/a    How long can you stand comfortably? 2 min    How long can you walk comfortably? 3-4 steps    Patient Stated Goals Improve balance and functional movements    Currently in Pain? No/denies                   INTERVENTIONS:   Pt transferred wheelchair to regular chair with hemiwalker in LUE x1 reps, stand pivot, min-mod A with uncontrolled sit down, falling back in chair;   Therex:   -LAQ (3lb on left LE) and no weight on right-  2 sets of 10 reps, 3 sec hold with cues to improve erect posture and increase terminal knee extension;  -Hamstring curl- blue TB on left; Yellow TB on right 2 sets of 12 reps -Hip add with ball squeeze x15 reps with cues to increase RLE activation -hip flexion march x10 reps x2 sets, with 3# ankle weight on LLE, cues for positioning including to avoid flexed posture and avoid compensating with posterior trunk lean; Pt required AAROM on RLE to avoid knee extension and focus on increasing hip flexion for better hip strengthening;   Pt required verbal cues for correct exercise technique. She often compensates for LE weakness with increased trunk movement  to facilitate increased LE ROM. She also is challenged with increased extensor tone in RLE having difficulty achieving knee flexion on RLE for proper positioning;     Therapeutic activity:    Stand pivot transfer-Focusing on each step process- - scoot out to edge - position feet (using left LE over top of right ankle to assist in bending knee into correct position)  - Hand position - Leaning forward while looking straight ahead - Pushing up without excessive pushing back with legs into chair X2 reps    Patient performed sit<>Stand from regular chair X 5 reps, min A  with cues for LE positioning and increase forward weight shift for safety. Pt often abducts/ER RLE with minimal weight bearing in foot. PT provided lateral support to RLE for 3 reps with better weight shift into LE with subsequent reps;    Gait training:    Patient ambulated in clinic on firm surface for approx 100 feet today using hemiwalker, close CGA, step to gait with minimal  cues and no LOB today.  She does ambulate with slower gait speed and increased forward flexed posture with increased fatigue; required cues for increased step length and erect posture;      Education provided throughout session via VC/TC and demonstration to facilitate movement at target joints and correct muscle activation for all testing and exercises performed.   Rationale for Evaluation and Treatment Rehabilitation                         PT Education - 03/17/22 0942     Education Details exercise technique/positioning;    Person(s) Educated Patient    Methods Explanation;Verbal cues    Comprehension Verbalized understanding;Returned demonstration;Verbal cues required;Need further instruction              PT Short Term Goals - 02/08/22 2210       PT SHORT TERM GOAL #1   Title Patient will be independent in home exercise program to improve strength/mobility for better functional independence with ADLs.    Baseline  no formal HEp for LE/balance at this time; 02/08/2022=Patient continues to require VC for reminders and assist with hip march/knee ext (seated for strengthening); Reminders to continue to work on static stand at home.    Time 4    Period Weeks    Status New    Target Date 12/14/21               PT Long Term Goals - 02/17/22 0943       PT LONG TERM GOAL #1   Title Patient will increase FOTO score to equal to or greater than   41  to demonstrate statistically significant improvement in mobility and quality of life.    Baseline 16 on 2/14; 02/08/2022=Patient unable to complete  today- No family present but will attempt again next visit available with family present to assist in completion; 02/17/2022= 40    Time 12    Period Weeks    Status On-going    Target Date 05/03/22      PT LONG TERM GOAL #3   Title Patient  will complete five times sit to stand test in < 30 seconds with UE assistance indicating an increased LE strength and improved balance.    Baseline 58.47 s with UE assist and with A from PT for R LE positioning; 02/08/2022=54 sec with use of L UE support and Min physical assist to position right LE.    Time 12    Period Weeks    Status On-going    Target Date 05/03/22      PT LONG TERM GOAL #4   Title Patient will increase Berg Balance score by > 6 points to demonstrate decreased fall risk during functional activities.    Baseline 12 on 2/14; 02/08/2022= 13/56    Time 12    Period Weeks    Status On-going    Target Date 05/03/22      PT LONG TERM GOAL #5   Title Pt will transfer from seated position on table/chair to/from transport chair/WC in order to indicate improved transfer ability.    Baseline Requires Min A for LE positioning and to prevent LOB. 02/08/2022=Patient attempted to perform on her own- Unable to push herself up from chair- Very min assist to position right LE into more flexed position and VC for hand placement. Patient able to stand with min Assist with use of  gait belt and VC to lean; 02/17/2022=Patient was able to transfer from transport w/c with some physical assist with Right to swing to pivot to side of chair. She was able to sit to stand today with only CGA and then stand pivot with right arm holding onto arm of chair. Only difficulty was stand to sit- Max VC not to plop into chair.    Time 12    Period Weeks    Status On-going    Target Date 05/03/22      PT LONG TERM GOAL #6   Title Patient will ambulate > 150 feet with CGA using Hemiwalker on level surfaces for improved household and short community distances.    Baseline 02/08/2022= 40 feet with use of HW, heavy CGA with max VC for gait sequencing and w/c follow. 5/18 = 85 feet with use of HW, close CGA    Time 12    Period Weeks    Status On-going    Target Date 05/03/22      PT LONG TERM GOAL #7   Title Pt will increase by at least 0.13 m/s in order to demonstrate clinically significant improvement in community ambulation.    Baseline 02/08/2022= 0.05 m/s using HW    Time 12    Period Weeks    Status New    Target Date 05/03/22                   Plan - 03/17/22 1055     Clinical Impression Statement Patient motivated and participated well within session. She continues to have weakness in BLE (R>L) with increased extensor tone in RLE. Pt does require mod VCs for proper positioning and exercise technique. She often compensates with increased trunk movement to try and increase AROM in LE hip with strengthening. She requires increased cues for proper sequencing and  positioning for transfers for safety. Pt often falls back in chair with decreased safety awareness of hand placement and LE positioning. She ambulated approximately 100 feet with slower gait speed due to fatigue. She would benefit from additional skilled PT intervention to improve strength, balance and mobility;    Personal Factors and Comorbidities Age;Comorbidity 1;Comorbidity 2;Comorbidity 3+;Time since onset of  injury/illness/exacerbation;Other    Comorbidities AFib, Renal Artery thrombosis, situational depression, HTN, Pyelnephritis, Seizure activity, COnstipation, AKI    Examination-Activity Limitations Bathing;Bed Mobility;Carry;Locomotion Level;Dressing;Sit;Squat;Stairs;Stand;Toileting;Transfers    Examination-Participation Restrictions Church;Community Activity;Laundry    Stability/Clinical Decision Making Evolving/Moderate complexity    Rehab Potential Fair    PT Frequency 2x / week    PT Duration 12 weeks    PT Treatment/Interventions ADLs/Self Care Home Management;Gait training;Stair training;Therapeutic activities;Therapeutic exercise;Balance training;Neuromuscular re-education;Patient/family education;Wheelchair mobility training;Manual techniques;Energy conservation;Dry needling;Passive range of motion;Taping;Spinal Manipulations;Joint Manipulations    PT Next Visit Plan Gait training, Transfer training, LE strengthening as appropriate.    PT Home Exercise Plan HEP: general LE and postural exercises    Consulted and Agree with Plan of Care Patient             Patient will benefit from skilled therapeutic intervention in order to improve the following deficits and impairments:  Abnormal gait, Decreased activity tolerance, Decreased endurance, Decreased knowledge of use of DME, Decreased range of motion, Decreased strength, Hypomobility, Impaired flexibility, Difficulty walking, Decreased balance, Decreased coordination, Decreased knowledge of precautions, Decreased mobility, Decreased safety awareness  Visit Diagnosis: Muscle weakness (generalized)  Difficulty in walking, not elsewhere classified  Other lack of coordination  Abnormality of gait and mobility  Unsteadiness on feet  Other abnormalities of gait and mobility     Problem List Patient Active Problem List   Diagnosis Date Noted   Blood clotting disorder ([place]) 12/27/2021   Elevated AST (SGOT) 10/22/2021   Lupus  anticoagulant positive 10/22/2021   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)    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[place])    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 ([place]) 09/24/2021   Cerebral embolism with cerebral infarction 09/23/2021   Pyelonephritis 09/19/2021   Pain due to onychomycosis of toenails of both feet 11/19/2020   Normocytic anemia 05/31/2020   Acute ischemic left MCA stroke ([place]) 04/30/2020   Right hemiplegia ([place]) 04/30/2020   Cerebrovascular accident ([place]) 04/21/2020   Atrial fibrillation with RVR ([place]) 04/21/2020   Essential hypertension 04/21/2020   Alcohol abuse 04/21/2020    Jearlene Bridwell, PT, DPT 03/17/2022, 11:01 AM  [place], [place], [address] Phone: [phone]   Fax:  [phone]  Name: Lindsey [name] MRN: [id number] Date of Birth: 06-22-1956

## 2022-03-22 ENCOUNTER — Ambulatory Visit: Payer: Medicare Other | Admitting: Physical Therapy

## 2022-03-22 ENCOUNTER — Ambulatory Visit: Payer: Medicare Other | Admitting: Occupational Therapy

## 2022-03-22 ENCOUNTER — Encounter: Payer: Medicare Other | Admitting: Speech Pathology

## 2022-03-22 DIAGNOSIS — R269 Unspecified abnormalities of gait and mobility: Secondary | ICD-10-CM

## 2022-03-22 DIAGNOSIS — R4701 Aphasia: Secondary | ICD-10-CM | POA: Diagnosis not present

## 2022-03-22 DIAGNOSIS — R262 Difficulty in walking, not elsewhere classified: Secondary | ICD-10-CM

## 2022-03-22 DIAGNOSIS — R2681 Unsteadiness on feet: Secondary | ICD-10-CM

## 2022-03-22 NOTE — Therapy (Signed)
OUTPATIENT PHYSICAL THERAPY TREATMENT NOTE   Patient Name: Lindsey [name] MRN: [id number] DOB:11/02/1955, 66 y.o., female Today's Date: 03/22/2022  PCP:  Donnie Coffin, MD    REFERRING PROVIDER: Izora Ribas, MD   PT End of Session - 03/22/22 [phone]     Visit Number 26    Number of Visits 41    Date for PT Re-Evaluation 05/03/22    Authorization Time Period Cert through [id number]; Recert [id number]    Progress Note Due on Visit 20    PT Start Time 0931    PT Stop Time 1015    PT Time Calculation (min) 44 min    Equipment Utilized During Treatment Gait belt    Activity Tolerance Patient limited by fatigue    Behavior During Therapy WFL for tasks assessed/performed              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 ([place]) 09/24/2021   Cerebral embolism with cerebral infarction 09/23/2021   Pyelonephritis 09/19/2021   Pain due to onychomycosis of toenails of both feet 11/19/2020   Normocytic anemia 05/31/2020   Acute ischemic left MCA stroke ([place]) 04/30/2020   Right hemiplegia ([place]) 04/30/2020   Cerebrovascular accident ([place]) 04/21/2020   Atrial fibrillation with RVR ([place]) 04/21/2020   Essential hypertension  04/21/2020   Alcohol abuse 04/21/2020    REFERRING DIAG: [id number] (ICD-10-CM) - 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 At this point she is limited to wheelchair as her main means of mobility. Pt caregiver reports all detailed information. Pt has hesitation with the left lower extremity and significant weakness on the right lower extremity. Pt will take 2-3 steps when transfering. Pt caregiver assists with all transfers.Pt has shower with ledge to steps over ledge when entering and utilizes bedside commode. Pt has 2 level home with chair lift for upstairs access. Pt would like to improve ransfers, improve neglect on her R side and improve her overall strength and mobility.Pt. is a 66 y.o. 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 Pt. has Right sided weakness, and receptive, and expressive aphasia. Pt. has had a previous CVA with right sided weakness in July 2021. PMHx includes: AFib, Renal Artery thrombosis, situational depression, HTN, Pyelnephritis, Seizure activity, COnstipation, AKI. Vitamin D deficiency, and urinary incontinence.  PRECAUTIONS: Fall  SUBJECTIVE: Pt reports no significant changes from last session. No falls, LOB or pain reported at this time.   PAIN:  Are you having pain? No     TODAY'S TREATMENT:   Exercise/Activity Sets/Reps/Time/ Resistance Assistance Charge type Comments  STS with 5 # AW on R for increased proprioception and to maintain R LE positioning  X 5    X 5 with hold at top without UE A for balance.  UE, CGA, Min A upon standing for  balance, Min A for maintenance of proper R LE positioning NMR Cues for forward trunk lean, with standing wihtout hemiwalker pt had L LE on back of chair for balance and proprioception   HS curl  X 10 BTB L LE X 10 YTB R LE    therex Cues for slow eccentric control on both Les, cues for full knee extension each rep with the R LE             Lateral seated step over small PVC pipe  2 x 10 R LE    There act  For part to whol practice for increased independence with transfers. Pole as external cue for step length, unable to clear on first few attempts but was able to clear pipe, same response on set 2   Ambulation around clinic  *50 feet  Hemiwalker,min A for balance at time, cga    there act  Cues for step length, cues for upright posture, difficulty with navigating turns around gym equipment                                                                    Treatment Provided this session    Rationale for Evaluation and Treatment Rehabilitation   Pt educated throughout session about proper posture and technique with exercises. Improved exercise technique, movement at target joints, use of target muscles after min to mod verbal, visual, tactile cues. Note: Portions of this document were prepared using Dragon voice recognition software and although reviewed may contain unintentional dictation errors in syntax, grammar, or spelling.    PATIENT EDUCATION: Education details: Tax inspector   Person educated: Patient Education method: Explanation, Demonstration, Tactile cues, and Verbal cues Education comprehension: returned demonstration, tactile cues required, and needs further education   HOME EXERCISE PROGRAM: No updates this session    PT Short Term Goals -       PT SHORT TERM GOAL #1   Title Patient will be independent in home exercise program to improve strength/mobility for better functional independence with ADLs.    Baseline no formal HEp for LE/balance at this time;  02/08/2022=Patient continues to require VC for reminders and assist with hip march/knee ext (seated for strengthening); Reminders to continue to work on static stand at home.    Time 4    Period Weeks    Status New    Target Date 12/14/21              PT Long Term Goals -       PT LONG TERM GOAL #1   Title Patient will increase FOTO score to equal to or greater than   41  to demonstrate statistically significant improvement in mobility and quality of life.    Baseline 16  on 2/14; 02/08/2022=Patient unable to complete today- No family present but will attempt again next visit available with family present to assist in completion; 02/17/2022= 40    Time 12    Period Weeks    Status On-going    Target Date 05/03/22      PT LONG TERM GOAL #3   Title Patient  will complete five times sit to stand test in < 30 seconds with UE assistance indicating an increased LE strength and improved balance.    Baseline 58.47 s with UE assist and with A from PT for R LE positioning; 02/08/2022=54 sec with use of L UE support and Min physical assist to position right LE.    Time 12    Period Weeks    Status On-going    Target Date 05/03/22      PT LONG TERM GOAL #4   Title Patient will increase Berg Balance score by > 6 points to demonstrate decreased fall risk during functional activities.    Baseline 12 on 2/14; 02/08/2022= 13/56    Time 12    Period Weeks    Status On-going    Target Date 05/03/22      PT LONG TERM GOAL #5   Title Pt will transfer from seated position on table/chair to/from transport chair/WC in order to indicate improved transfer ability.    Baseline Requires Min A for LE positioning and to prevent LOB. 02/08/2022=Patient attempted to perform on her own- Unable to push herself up from chair- Very min assist to position right LE into more flexed position and VC for hand placement. Patient able to stand with min Assist with use of gait belt and VC to lean; 02/17/2022=Patient was able to  transfer from transport w/c with some physical assist with Right to swing to pivot to side of chair. She was able to sit to stand today with only CGA and then stand pivot with right arm holding onto arm of chair. Only difficulty was stand to sit- Max VC not to plop into chair.    Time 12    Period Weeks    Status On-going    Target Date 05/03/22      PT LONG TERM GOAL #6   Title Patient will ambulate > 150 feet with CGA using Hemiwalker on level surfaces for improved household and short community distances.    Baseline 02/08/2022= 40 feet with use of HW, heavy CGA with max VC for gait sequencing and w/c follow. 5/18 = 85 feet with use of HW, close CGA    Time 12    Period Weeks    Status On-going    Target Date 05/03/22      PT LONG TERM GOAL #7   Title Pt will increase by at least 0.13 m/s in order to demonstrate clinically significant improvement in community ambulation.    Baseline 02/08/2022= 0.05 m/s using HW    Time 12    Period Weeks    Status New    Target Date 05/03/22              Plan -    Clinical Impression statement: Continued with current plan of care as laid out in evaluation and recent prior sessions. Pt remains motivated to advance progress toward goals in order to maximize independence and safety at home. Pt requires high level assistance and cuing for completion of exercises in order to provide adequate level of stimulation and perturbation. Author allows pt as much opportunity  as possible to perform independent righting strategies, only stepping in when pt is unable to prevent falling to floor. Pt requires significant cueing for proper sit to and from standing for sequencing of UE as well as maintenance of balance.  Pt continues to demonstrate progress toward goals AEB progression of some interventions this date either in volume or intensity.   Personal Factors and Comorbidities Age;Comorbidity 1;Comorbidity 2;Comorbidity 3+;Time since onset of  injury/illness/exacerbation;Other    Comorbidities AFib, Renal Artery thrombosis, situational depression, HTN, Pyelnephritis, Seizure activity, COnstipation, AKI    Examination-Activity Limitations Bathing;Bed Mobility;Carry;Locomotion Level;Dressing;Sit;Squat;Stairs;Stand;Toileting;Transfers    Examination-Participation Restrictions Church;Community Activity;Laundry    Stability/Clinical Decision Making Evolving/Moderate complexity    Rehab Potential Fair    PT Frequency 2x / week    PT Duration 12 weeks    PT Treatment/Interventions ADLs/Self Care Home Management;Gait training;Stair training;Therapeutic activities;Therapeutic exercise;Balance training;Neuromuscular re-education;Patient/family education;Wheelchair mobility training;Manual techniques;Energy conservation;Dry needling;Passive range of motion;Taping;Spinal Manipulations;Joint Manipulations    PT Next Visit Plan Gait training, Transfer training, LE strengthening as appropriate.    PT Home Exercise Plan HEP: general LE and postural exercises    Consulted and Agree with Plan of Care Patient               Norman Herrlich, PT 03/22/2022, 2:01 PM

## 2022-03-24 ENCOUNTER — Encounter: Payer: Self-pay | Admitting: Occupational Therapy

## 2022-03-24 ENCOUNTER — Ambulatory Visit: Payer: Medicare Other | Admitting: Occupational Therapy

## 2022-03-24 ENCOUNTER — Encounter: Payer: Medicare Other | Admitting: Speech Pathology

## 2022-03-24 ENCOUNTER — Encounter: Payer: Self-pay | Admitting: Physical Therapy

## 2022-03-24 ENCOUNTER — Ambulatory Visit: Payer: Medicare Other | Admitting: Physical Therapy

## 2022-03-24 DIAGNOSIS — M6281 Muscle weakness (generalized): Secondary | ICD-10-CM

## 2022-03-24 DIAGNOSIS — R4701 Aphasia: Secondary | ICD-10-CM | POA: Diagnosis not present

## 2022-03-24 DIAGNOSIS — R2689 Other abnormalities of gait and mobility: Secondary | ICD-10-CM

## 2022-03-24 DIAGNOSIS — R262 Difficulty in walking, not elsewhere classified: Secondary | ICD-10-CM

## 2022-03-24 DIAGNOSIS — R269 Unspecified abnormalities of gait and mobility: Secondary | ICD-10-CM

## 2022-03-24 DIAGNOSIS — R278 Other lack of coordination: Secondary | ICD-10-CM

## 2022-03-24 DIAGNOSIS — R2681 Unsteadiness on feet: Secondary | ICD-10-CM

## 2022-03-24 NOTE — Therapy (Signed)
OUTPATIENT PHYSICAL THERAPY TREATMENT NOTE   Patient Name: Jeshia [name] MRN: [id number] DOB:03-04-1956, 66 y.o., female Today's Date: 03/24/2022  PCP: Dr. 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 No pertinent surgical history. 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 ([name]) 09/24/2021   Cerebral embolism with cerebral infarction 09/23/2021   Pyelonephritis 09/19/2021   Pain due to onychomycosis of toenails of both feet 11/19/2020   Normocytic anemia 05/31/2020   Acute ischemic left MCA stroke ([name]) 04/30/2020   Right hemiplegia ([name]) 04/30/2020   Cerebrovascular accident ([name]) 04/21/2020   Atrial fibrillation with RVR ([name]) 04/21/2020   Essential hypertension  04/21/2020   Alcohol abuse 04/21/2020    REFERRING DIAG: s/p CVA  THERAPY DIAG:  Difficulty in walking, not elsewhere classified  Abnormality of gait and mobility  Unsteadiness on feet  Muscle weakness (generalized)  Other lack of coordination  Other abnormalities of gait and mobility  Rationale for Evaluation and Treatment Rehabilitation  PERTINENT HISTORY: Pt was doing very well and was walking short distances wqith a cane prior to second stroke in december. At this point she is limited to wheelchair as her main means of mobility. Pt caregiver reports all detailed information. Pt has hesitation with the left lower extremity and significant weakness on the right lower extremity. Pt will take 2-3 steps when transfering. Pt caregiver assists with all transfers.Pt has shower with ledge to steps over ledge when entering and utilizes bedside commode. Pt has 2 level home with chair lift for upstairs access. Pt would like to improve ransfers, improve neglect on her R side and improve her overall strength and mobility.Pt. is a 66 y.o. 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 Pt. has Right sided weakness, and receptive, and expressive aphasia. Pt. has had a previous CVA with right sided weakness in July 2021. PMHx includes: AFib, Renal Artery thrombosis, situational depression, HTN, Pyelnephritis, Seizure activity, COnstipation, AKI. Vitamin D deficiency, and urinary incontinence.  PRECAUTIONS: fall risk, wearing RLE AFO  SUBJECTIVE: Pt reports she is still having difficulty with transfers at home. She will still plop down from time to time. She reports doing exercises every day.   PAIN:  Are you having pain? No     TODAY'S TREATMENT:    INTERVENTIONS:   Pt transferred wheelchair to regular chair with hemiwalker in LUE x1 reps, stand pivot, min-mod A with mod VCs for hand placement/walker positioning to control  sit down for better safety and mobility;    Therex:   -LAQ (3lb on left LE) and no weight on right- 1 sets of 10 reps, 3 sec hold with cues to improve erect posture and increase terminal knee extension;    Progressed with ball between feet, BLE LAQ x10 reps with compensation of trunk extension holding onto chair to help lift LE;  -Hamstring curl- blue TB on left; Yellow TB on right 2 sets of 15 reps -Hip add with ball squeeze x15 reps with cues to increase RLE activation -hip flexion march x10 reps x1 sets, with 3# ankle weight on LLE, cues for positioning including to avoid flexed posture and avoid compensating with posterior trunk lean; Pt required AAROM on RLE to avoid knee extension and focus on increasing hip flexion for better hip strengthening;    Pt required verbal cues for correct exercise technique. She often compensates for LE weakness with increased trunk movement to facilitate increased LE ROM. She also is challenged with increased extensor tone in RLE having difficulty achieving knee flexion on RLE for proper positioning;   Pt more confused this session requiring increased cues for correct exercise technique;  She tolerated exercise well reporting less stiffness after exercise.    Therapeutic activity:    Stand pivot transfer-Focusing on each step process- - scoot out to edge - position feet (using left LE over top of right ankle to assist in bending knee into correct position)  - Hand position - Leaning forward while looking straight ahead - Pushing up without excessive pushing back with legs into chair X2 reps    Patient performed sit<>Stand from regular chair X 5 reps, min A  with cues for LE positioning and increase forward weight shift for safety. Pt often abducts/ER RLE with minimal weight bearing in foot. PT provided lateral support to RLE for 3 reps with better weight shift into LE with subsequent reps;    Gait training:    Patient ambulated in clinic on firm surface  for approx 70 feet today using hemiwalker, Min A , step to gait with minimal verbal cues and no LOB today.  She does ambulate with slower gait speed and increased forward flexed posture with increased fatigue; required cues for increased step length and erect posture;      Education provided throughout session via VC/TC and demonstration to facilitate movement at target joints and correct muscle activation for all testing and exercises performed.      PATIENT EDUCATION: Education details: positioning/exercise IT trainer Person educated: Patient Education method: Solicitor, and Verbal cues Education comprehension: verbalized understanding, returned demonstration, and needs further education   HOME EXERCISE PROGRAM: Continue as previously given;    PT Short Term Goals - 02/08/22 2210       PT SHORT TERM GOAL #1   Title Patient will be independent in home exercise program to improve strength/mobility for better functional independence with ADLs.    Baseline no formal HEp for LE/balance at this time; 02/08/2022=Patient continues to require VC for reminders and assist with hip  march/knee ext (seated for strengthening); Reminders to continue to work on static stand at home.    Time 4    Period Weeks    Status New    Target Date 12/14/21              PT Long Term Goals - 02/17/22 0943       PT LONG TERM GOAL #1   Title Patient will increase FOTO score to equal to or greater than   41  to demonstrate statistically significant improvement in mobility and quality of life.    Baseline 16 on 2/14; 02/08/2022=Patient unable to complete today- No family present but will attempt again next visit available with family present to assist in completion; 02/17/2022= 40    Time 12    Period Weeks    Status On-going    Target Date 05/03/22      PT LONG TERM GOAL #3   Title Patient  will complete five times sit to stand test in < 30 seconds with UE assistance indicating  an increased LE strength and improved balance.    Baseline 58.47 s with UE assist and with A from PT for R LE positioning; 02/08/2022=54 sec with use of L UE support and Min physical assist to position right LE.    Time 12    Period Weeks    Status On-going    Target Date 05/03/22      PT LONG TERM GOAL #4   Title Patient will increase Berg Balance score by > 6 points to demonstrate decreased fall risk during functional activities.    Baseline 12 on 2/14; 02/08/2022= 13/56    Time 12    Period Weeks    Status On-going    Target Date 05/03/22      PT LONG TERM GOAL #5   Title Pt will transfer from seated position on table/chair to/from transport chair/WC in order to indicate improved transfer ability.    Baseline Requires Min A for LE positioning and to prevent LOB. 02/08/2022=Patient attempted to perform on her own- Unable to push herself up from chair- Very min assist to position right LE into more flexed position and VC for hand placement. Patient able to stand with min Assist with use of gait belt and VC to lean; 02/17/2022=Patient was able to transfer from transport w/c with some physical assist with Right to swing to pivot to side of chair. She was able to sit to stand today with only CGA and then stand pivot with right arm holding onto arm of chair. Only difficulty was stand to sit- Max VC not to plop into chair.    Time 12    Period Weeks    Status On-going    Target Date 05/03/22      PT LONG TERM GOAL #6   Title Patient will ambulate > 150 feet with CGA using Hemiwalker on level surfaces for improved household and short community distances.    Baseline 02/08/2022= 40 feet with use of HW, heavy CGA with max VC for gait sequencing and w/c follow. 5/18 = 85 feet with use of HW, close CGA    Time 12    Period Weeks    Status On-going    Target Date 05/03/22      PT LONG TERM GOAL #7   Title Pt will increase by at least 0.13 m/s in order to demonstrate clinically significant  improvement in community ambulation.    Baseline 02/08/2022= 0.05 m/s using  HW    Time 12    Period Weeks    Status New    Target Date 05/03/22              Plan - 03/24/22 1002     Clinical Impression Statement Patient motivated and participated well within session. She continues to have weakness in BLE (R>L) with increased extensor tone in RLE. Pt does require mod VCs for proper positioning and exercise technique. She often compensates with increased trunk movement to try and increase AROM in LE hip with strengthening. She requires increased cues for proper sequencing and positioning for transfers for safety. Pt does require increased cues for hand placement for transfer but was able to control descent better this session.  She ambulated approximately 70 feet with slower gait speed due to fatigue. Pt appeared more confused this session requiring increased cues for correct exercise technique, exhibiting increased apraxic movement. She would benefit from additional skilled PT intervention to improve strength, balance and mobility;    Personal Factors and Comorbidities Age;Comorbidity 1;Comorbidity 2;Comorbidity 3+;Time since onset of injury/illness/exacerbation;Other    Comorbidities AFib, Renal Artery thrombosis, situational depression, HTN, Pyelnephritis, Seizure activity, COnstipation, AKI    Examination-Activity Limitations Bathing;Bed Mobility;Carry;Locomotion Level;Dressing;Sit;Squat;Stairs;Stand;Toileting;Transfers    Examination-Participation Restrictions Church;Community Activity;Laundry    Stability/Clinical Decision Making Evolving/Moderate complexity    Rehab Potential Fair    PT Frequency 2x / week    PT Duration 12 weeks    PT Treatment/Interventions ADLs/Self Care Home Management;Gait training;Stair training;Therapeutic activities;Therapeutic exercise;Balance training;Neuromuscular re-education;Patient/family education;Wheelchair mobility training;Manual techniques;Energy  conservation;Dry needling;Passive range of motion;Taping;Spinal Manipulations;Joint Manipulations    PT Next Visit Plan Gait training, Transfer training, LE strengthening as appropriate.    PT Home Exercise Plan HEP: general LE and postural exercises    Consulted and Agree with Plan of Care Patient               Oluwasemilore Bahl, PT, DPT 03/24/2022, 10:40 AM

## 2022-03-24 NOTE — Therapy (Signed)
OUTPATIENT OCCUPATIONAL THERAPY TREATMENT NOTE   Patient Name: Lindsey [name] MRN: [id number] DOB:05/13/1956, 66 y.o., female Today's Date: 03/24/2022   REFERRING PROVIDER: Damaris Schooner, MD   OT End of Session - 03/24/22 1427     Visit Number 37    Number of Visits 48    Date for OT Re-Evaluation 03/31/22    Authorization Type Progress report period starting 12/02/2021    OT Start Time 1015    OT Stop Time 1100    OT Time Calculation (min) 45 min    Activity Tolerance Patient tolerated treatment well    Behavior During Therapy WFL for tasks assessed/performed             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 No pertinent surgical history. 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 is a 66 y.o. 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 Pt. has Right sided weakness, and receptive, and expressive aphasia. Pt. has had a previous CVA with right sided weakness in July 2021. PMHx includes: AFib, Renal Artery thrombosis, situational depression, HTN, Pyelnephritis, Seizure activity, COnstipation, AKI. Vitamin D deficiency, and urinary incontinence/  PRECAUTIONS:   SUBJECTIVE:   Pt. Arrived for OT alone today.  PAIN:  Are you having pain? No     OBJECTIVE:   TODAY'S TREATMENT:   Rationale for Evaluation and Treatment Rehabilitation  OT TREATMENT     Manual Therapy:   Pt. tolerated soft tissue massage to the right scapular musculature. Pt. tolerated scapular mobilizations in elevation, depression, abduction, and rotation following moist heat modality to the right shoulder. Pt. tolerated soft tissue mobilizations for radius on ulna, and metacarpal spread stretches to decrease tightness, and prepare the UE, and hand for ROM and therapeutic Ex.Manual therapy was performed independent of, and in preparation for there. Ex.    Therapeutic Exercise:   Pt. tolerated PROM through all joint ranges of the RUE, and hand following moist heat modality, and manual therapy. Pt. education was provided about self-ROM to the right MPs, PIPs, and DIPs. Pt. Required hand over hand assist to perform self-ROM.  Pt. worked on [employer], and  reciprocal motion using the UBE while seated for 5 min. With no resistance. Constant monitoring was provided, along with support, and an ACE wrap in place to the right hand.   Pt. presents with flexor tone, and tightness through the RUE, wrist, and digits. Pt. continues to respond well to the session with less tone, tightness, and increased ROM noted throughout the RUE following ROM,  and manual therapy. Pt. had limited tolerance for the UBE today. Pt. presented with increased tightness with right shoulder abduction, and flexion. Pt. continues to work on normalizing tone, and facilitating active volitional movement in the RUE, and hand to improve engagement of the RUE during ADLs, as well as improve transfers, and functional mobility for toileting.        PATIENT EDUCATION: Education details: RUE functioning Person educated: Patient Education method: Customer service manager Education comprehension: verbalized understanding, returned demonstration, verbal cues required, and tactile cues required   [place] with ongoing HEPs for ROM to the RUE, hand, and digits.    OT Short Term Goals - 02/17/22 1032       OT SHORT TERM GOAL #1   Title Pt and caregiver will be independent with HEP for RUE    Baseline Eval: No current program. 10th visit: Caregivers  report being comfortable with exercises at home. 01/06/2022: Continue ongoing HEPs for UE. 30th visit: Pt./caregivers to continue with ROM    Time 6    Period Weeks    Status On-going    Target Date 03/31/22              OT Long Term Goals - 02/17/22 1046       OT LONG TERM GOAL #1   Title Pt. will engage engage the RUE as a gross assist/atabilizer during ADLs 100% of the time    Baseline 30th visit: Pt. is initiating using her RUE as a gross stabilizer with maxA 01/06/2022: Pt. is starting to engage her RUE as a gross stabiliizer with increased cues. Eval: pt. does not currently engage her RUE during ADLs. 10th visit: Pt. continues to need to work towards using her RUE as a gross stabilizer.    Time 12    Status On-going    Target Date 03/31/22      OT LONG TERM GOAL #2   Title Pt. will perform UE dressing using one armed dressing techniques    Baseline Eval: MaxA, 10th visit: [place] 01/06/2022: min-modA 30th visit; min0-modA    Time 12    Period Weeks    Status On-going    Target  Date 03/31/22      OT LONG TERM GOAL #3   Title Pt. will require minA to stand and hike pants    Baseline Eval: MaxA, 10th visit: [place] 01/06/2022: [place] 30th visit: ModA    Time 12    Period Weeks    Status On-going    Target Date 03/31/22      OT LONG TERM GOAL #4   Title Pt. will perform LE dressing with minA    Baseline Eval: MaxA 10th visit: Pt. requires MaxA for donning her brace, shoes, and socks. ModA with pants. 01/06/2022: ModA pants, MaxA shoes, socks, and brace 30th visit: ModA pants, MaxA shoes, socks, and brace.    Time 12    Period Weeks    Status On-going    Target Date 03/31/22      OT LONG TERM GOAL #5   Title Pt. will improve  FOTO score by 2 pt. to reflect funcational change    Baseline Eval: FOTO score: 12, 10th visit: FOTO score 27 01/06/2022: 27 30th visit TBA at next visit secondary to no family available during the session.    Time 12    Period Weeks    Status On-going    Target Date 03/31/22      OT LONG TERM GOAL #6   Title Patient will transfer to bedside commode or toilet with supervision.    Baseline 30th visit: min-modA. 01/06/2022: min-ModA    Time 4    Period Weeks    Status On-going    Target Date 03/31/22              Plan - 03/24/22 1429     Clinical Impression Statement Pt. presents with flexor tone, and tightness through the RUE, wrist, and digits. Pt. continues to respond well to the session with less tone, tightness, and increased ROM noted throughout the RUE following ROM, and manual therapy. Pt. had limited tolerance for the UBE today. Pt. presented with increased tightness with right shoulder abduction, and flexion. Pt. continues to work on normalizing tone, and facilitating active volitional movement in the RUE, and hand to improve engagement of the RUE during ADLs, as well as improve transfers, and functional mobility for toileting.      OT Occupational Profile and History Detailed Assessment- Review of Records and additional review of  physical, cognitive, psychosocial history related to current functional performance    Occupational performance deficits (Please refer to evaluation for details): Leisure;IADL's;ADL's    Body Structure / Function / Physical Skills ADL;IADL;FMC;ROM;UE functional use;Strength;Decreased knowledge of use of DME;Coordination    Rehab Potential Fair    Clinical Decision Making Several treatment options, min-mod task modification necessary    Comorbidities Affecting Occupational Performance: May have comorbidities impacting occupational performance    Modification or Assistance to Complete Evaluation  Min-Moderate modification of tasks or assist with assess necessary to complete eval    OT Frequency 2x / week    OT Duration 12 weeks    OT Treatment/Interventions Self-care/ADL training;Neuromuscular education;Electrical Stimulation;Patient/family education;Therapeutic activities;Passive range of motion;Building services engineer;Therapeutic exercise;DME and/or AE instruction;Moist Heat    Consulted and Agree with Plan of Care Patient            Olegario Messier, MS, OTR/L   Olegario Messier, OT 03/24/2022, 2:55 PM

## 2022-03-28 ENCOUNTER — Ambulatory Visit: Payer: Medicare Other | Admitting: Occupational Therapy

## 2022-03-28 ENCOUNTER — Encounter: Payer: Medicare Other | Admitting: Speech Pathology

## 2022-03-28 ENCOUNTER — Encounter: Payer: Self-pay | Admitting: Physical Therapy

## 2022-03-28 ENCOUNTER — Ambulatory Visit: Payer: Medicare Other | Admitting: Physical Therapy

## 2022-03-28 DIAGNOSIS — M6281 Muscle weakness (generalized): Secondary | ICD-10-CM

## 2022-03-28 DIAGNOSIS — R262 Difficulty in walking, not elsewhere classified: Secondary | ICD-10-CM

## 2022-03-28 DIAGNOSIS — R269 Unspecified abnormalities of gait and mobility: Secondary | ICD-10-CM

## 2022-03-28 DIAGNOSIS — R4701 Aphasia: Secondary | ICD-10-CM | POA: Diagnosis not present

## 2022-03-28 DIAGNOSIS — R2681 Unsteadiness on feet: Secondary | ICD-10-CM

## 2022-03-30 ENCOUNTER — Encounter: Payer: Self-pay | Admitting: Physical Therapy

## 2022-03-30 ENCOUNTER — Encounter: Payer: Medicare Other | Admitting: Speech Pathology

## 2022-03-30 ENCOUNTER — Ambulatory Visit: Payer: Medicare Other | Admitting: Physical Therapy

## 2022-03-30 ENCOUNTER — Ambulatory Visit: Payer: Medicare Other | Admitting: Occupational Therapy

## 2022-03-30 DIAGNOSIS — R2689 Other abnormalities of gait and mobility: Secondary | ICD-10-CM

## 2022-03-30 DIAGNOSIS — R4701 Aphasia: Secondary | ICD-10-CM | POA: Diagnosis not present

## 2022-03-30 DIAGNOSIS — R262 Difficulty in walking, not elsewhere classified: Secondary | ICD-10-CM

## 2022-03-30 DIAGNOSIS — R278 Other lack of coordination: Secondary | ICD-10-CM

## 2022-03-30 DIAGNOSIS — R2681 Unsteadiness on feet: Secondary | ICD-10-CM

## 2022-03-30 DIAGNOSIS — M6281 Muscle weakness (generalized): Secondary | ICD-10-CM

## 2022-03-30 DIAGNOSIS — R269 Unspecified abnormalities of gait and mobility: Secondary | ICD-10-CM

## 2022-03-30 NOTE — Therapy (Signed)
OUTPATIENT PHYSICAL THERAPY TREATMENT NOTE   Patient Name: Lindsey [name] MRN: [id number] DOB:[birth year], 66 y.o., female Today's Date: 03/31/2022  PCP: Dr. 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 No pertinent surgical history. 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) 09/24/2021   Cerebral embolism with cerebral infarction 09/23/2021   Pyelonephritis 09/19/2021   Pain due to onychomycosis of toenails of both feet 11/19/2020   Normocytic anemia 05/31/2020   Acute ischemic left MCA stroke (HCC) 04/30/2020   Right hemiplegia (HCC) 04/30/2020   Cerebrovascular accident (HCC) 04/21/2020   Atrial fibrillation with RVR (HCC) 04/21/2020   Essential hypertension  04/21/2020   Alcohol abuse 04/21/2020    REFERRING DIAG: s/p CVA  THERAPY DIAG:  Muscle weakness (generalized)  Difficulty in walking, not elsewhere classified  Abnormality of gait and mobility  Unsteadiness on feet  Other lack of coordination  Other abnormalities of gait and mobility  Rationale for Evaluation and Treatment Rehabilitation  PERTINENT HISTORY: Pt was doing very well and was walking short distances wqith a cane prior to second stroke in december. At this point she is limited to wheelchair as her main means of mobility. Pt caregiver reports all detailed information. Pt has hesitation with the left lower extremity and significant weakness on the right lower extremity. Pt will take 2-3 steps when transfering. Pt caregiver assists with all transfers.Pt has shower with ledge to steps over ledge when entering and utilizes bedside commode. Pt has 2 level home with chair lift for upstairs access. Pt would like to improve ransfers, improve neglect on her R side and improve her overall strength and mobility.Pt. is a 66 y.o. 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 Pt. has Right sided weakness, and receptive, and expressive aphasia. Pt. has had a previous CVA with right sided weakness in July 2021. PMHx includes: AFib, Renal Artery thrombosis, situational depression, HTN, Pyelnephritis, Seizure activity, COnstipation, AKI. Vitamin D deficiency, and urinary incontinence.  PRECAUTIONS: fall risk, wearing RLE  AFO  SUBJECTIVE: Pt reports doing well. She states HEP is going good. No new concerns. No new falls. Daughter reports patient does a little walking but not much because they aren't confident with her stability;   Daughter present at treatment session;   PAIN:  Are you having pain? No     TODAY'S TREATMENT:    INTERVENTIONS:   Pt transferred wheelchair to regular chair with hemiwalker in LUE x1  reps, stand pivot, min A with min VCs for hand placement/walker positioning, pt does require increased time but was able to exhibit better control;    Therex: Seated    -LAQ (3lb on left LE) and no weight on right- 1 sets of 10 reps, 3 sec hold   -BLE LAQ with small ball between feet x15 reps                                                                                                                                                                                                                                                                                                                                                                                                                                                                                                                                                                                                                                                                                                                                                                                                                                                                                                                                                                                                                                                                                                                                                                                                                                                                                                                                                                                                                                                                                                                                                                                                                                                                                                                                                                                                                                                                                                                                                                                                                                                                                                                                                                                                                                                                                                                                                                                                                                                                                                                                                                                                                                                                                                                                                                                                                                                                                                                                                                                                                                                                                                                                                                                                                                                                                                                                                                                                                                                                                                                                                                                                                                                                                                                                                                                                                                                                                                                                                                                                                                                                                                                                                                                                                                                                                                                                                                                                                                                                                                                                                                                                                                                                                                                                                                                                                                                                                                                                                                                                                                                                                                                                                                                                                                                                                                                                                                                                                                                                                                                                                                                                                                                                                                                                                                                                                                                                                                                                                                                                                                                                                                                                                                                                                                                                                                                                                                                                                                                                                                                                                                                                                                                                                                                                                                                                                                                                                                                                                                                                                                                                                                                                                                                                                                                                                                                                                                                                                                                                                                                                                                                                                                                                                                                                                                                                                                                                                                                                                                                                                                                                                                                                                                                                                                                                                                                                                                                                                                                                                                                                                                                                                                                                                                                                                                                                                                                                                                                                                                                                                                                                                                                                                                                                                                                                                                                                                                                                                                                                                                                                                                                                                                                                                                                                                                                                                                                                                                                                                                                                                                                                                                                                                                                                                                                                                                                                                                                                                                                                                                                                                                                                                                                                                                                                                                                                                                                                                                                                                                                                                                                                                                                                                                                                                                                                                                                                                                                                                                                                                                                                                                                                                                                                                                                                                                                                                                                                                                                                                                                                                                                                                                                                                                                                                                                                                                                                                -  Hip add with ball squeeze x15 reps R LE only with PT holding ball in place of L LE. Cues for proper performance and good adductor contraction on approximately 25% of attempts. Pt attempts tp compensate with trunk lateral movements  -hip flexion march  x 10 reps each LE, 3# LLE, AAROM on RLE with min A for proper positioning     Pt required verbal cues for correct exercise technique. She often compensates for LE weakness with increased trunk movement to facilitate increased LE ROM.  Therapeutic activity:     Patient performed sit<>Stand from regular chair  X 5 reps, with hemi walker placed in front of pt for assistance with balance.  - scoot out to edge - position feet (using left LE over top of right ankle to assist in bending knee into correct position)  - Hand position - Leaning forward while looking straight ahead - Pushing up without excessive pushing back with legs into chair - pt provided constant feedback of performance via " yes and no" commands for as she continues to show hesitancy with where to put arm.   Gait training:    Patient ambulated in clinic on firm surface for approx 70 feet today using hemiwalker, Min A , step to gait with minimal verbal cues and no LOB today.  She does ambulate with slower gait speed and increased forward flexed posture with increased fatigue; cues for hemiwalker placement with turns; Required cues for step through gait pattern.      Education provided throughout session via VC/TC and demonstration to facilitate  movement at target joints and correct muscle activation for all testing and exercises performed.      PATIENT EDUCATION: Education details: positioning/exercise Therapist, sports Person educated: Patient Education method: Education officer, environmental, and Verbal cues Education comprehension: verbalized understanding, returned demonstration, and needs further education   HOME EXERCISE PROGRAM: Continue as previously given;    PT Short Term Goals - 02/08/22 2210       PT SHORT TERM GOAL #1   Title Patient will be independent in home exercise program to improve strength/mobility for better functional independence with ADLs.    Baseline no formal HEp for LE/balance at this time; 02/08/2022=Patient continues to require VC for reminders and assist with hip march/knee ext (seated for strengthening); Reminders to continue to work on static stand at home.    Time 4    Period Weeks    Status New    Target Date 12/14/21              PT Long Term Goals - 02/17/22 0943       PT LONG TERM GOAL #1   Title Patient will increase FOTO score to equal to or greater than   41  to demonstrate statistically significant improvement in mobility and quality of life.    Baseline 16 on 2/14; 02/08/2022=Patient unable to complete today- No family present but will attempt again next visit available with family present to assist in completion; 02/17/2022= 40    Time 12    Period Weeks    Status On-going    Target Date 05/03/22      PT LONG TERM GOAL #3   Title Patient  will complete five times sit to stand test in < 30 seconds with UE assistance indicating an increased LE strength and improved balance.    Baseline 58.47 s with UE assist and with A from PT for R LE positioning; 02/08/2022=54 sec with use of L UE support  and Min physical assist to position right LE.    Time 12    Period Weeks    Status On-going    Target Date 05/03/22      PT LONG TERM GOAL #4   Title Patient will increase Berg Balance  score by > 6 points to demonstrate decreased fall risk during functional activities.    Baseline 12 on 2/14; 02/08/2022= 13/56    Time 12    Period Weeks    Status On-going    Target Date 05/03/22      PT LONG TERM GOAL #5   Title Pt will transfer from seated position on table/chair to/from transport chair/WC in order to indicate improved transfer ability.    Baseline Requires Min A for LE positioning and to prevent LOB. 02/08/2022=Patient attempted to perform on her own- Unable to push herself up from chair- Very min assist to position right LE into more flexed position and VC for hand placement. Patient able to stand with min Assist with use of gait belt and VC to lean; 02/17/2022=Patient was able to transfer from transport w/c with some physical assist with Right to swing to pivot to side of chair. She was able to sit to stand today with only CGA and then stand pivot with right arm holding onto arm of chair. Only difficulty was stand to sit- Max VC not to plop into chair.    Time 12    Period Weeks    Status On-going    Target Date 05/03/22      PT LONG TERM GOAL #6   Title Patient will ambulate > 150 feet with CGA using Hemiwalker on level surfaces for improved household and short community distances.    Baseline 02/08/2022= 40 feet with use of HW, heavy CGA with max VC for gait sequencing and w/c follow. 5/18 = 85 feet with use of HW, close CGA    Time 12    Period Weeks    Status On-going    Target Date 05/03/22      PT LONG TERM GOAL #7   Title Pt will increase by at least 0.13 m/s in order to demonstrate clinically significant improvement in community ambulation.    Baseline 02/08/2022= 0.05 m/s using HW    Time 12    Period Weeks    Status New    Target Date 05/03/22              Plan - 03/24/22 1002     Clinical Impression Statement Continued with current plan of care as laid out in evaluation and recent prior sessions. Pt remains motivated to advance progress toward  goals in order to maximize independence and safety at home. Pt requires high level assistance and cuing for completion of exercises in order to provide adequate level of stimulation and perturbation.  Pt continues to have difficulty with transfer movements and functional activities like sit to and from stand safely and in controlled fashion.  She was able to exhibit some improvement in safety awareness requiring fewer cues for sequencing. Pt was more upset this session after having a hard time verbalizing some words.  Patient would benefit from additional skilled PT intervention to improve strength, balance and gait safety;     Personal Factors and Comorbidities Age;Comorbidity 1;Comorbidity 2;Comorbidity 3+;Time since onset of injury/illness/exacerbation;Other    Comorbidities AFib, Renal Artery thrombosis, situational depression, HTN, Pyelnephritis, Seizure activity, COnstipation, AKI    Examination-Activity Limitations Bathing;Bed Mobility;Carry;Locomotion Level;Dressing;Sit;Squat;Stairs;Stand;Toileting;Transfers    Examination-Participation  Restrictions Church;Community Activity;Laundry    Stability/Clinical Decision Making Evolving/Moderate complexity    Rehab Potential Fair    PT Frequency 2x / week    PT Duration 12 weeks    PT Treatment/Interventions ADLs/Self Care Home Management;Gait training;Stair training;Therapeutic activities;Therapeutic exercise;Balance training;Neuromuscular re-education;Patient/family education;Wheelchair mobility training;Manual techniques;Energy conservation;Dry needling;Passive range of motion;Taping;Spinal Manipulations;Joint Manipulations    PT Next Visit Plan Gait training, Transfer training, LE strengthening as appropriate.    PT Home Exercise Plan HEP: general LE and postural exercises    Consulted and Agree with Plan of Care Patient               Natika Geyer, PT, DPT 03/31/2022, 1:03 PM

## 2022-03-31 ENCOUNTER — Ambulatory Visit: Payer: Medicare Other | Admitting: Podiatry

## 2022-03-31 LAB — PROTIME-INR: INR: 1.5 — AB (ref 0.80–1.20)

## 2022-04-04 ENCOUNTER — Ambulatory Visit: Payer: Medicare Other | Attending: Physical Medicine and Rehabilitation | Admitting: Occupational Therapy

## 2022-04-04 ENCOUNTER — Ambulatory Visit: Payer: Medicare Other | Admitting: Physical Therapy

## 2022-04-04 ENCOUNTER — Encounter: Payer: Medicare Other | Admitting: Speech Pathology

## 2022-04-04 ENCOUNTER — Encounter: Payer: Self-pay | Admitting: Occupational Therapy

## 2022-04-04 DIAGNOSIS — I63542 Cerebral infarction due to unspecified occlusion or stenosis of left cerebellar artery: Secondary | ICD-10-CM | POA: Diagnosis present

## 2022-04-04 DIAGNOSIS — R262 Difficulty in walking, not elsewhere classified: Secondary | ICD-10-CM | POA: Diagnosis present

## 2022-04-04 DIAGNOSIS — R278 Other lack of coordination: Secondary | ICD-10-CM | POA: Insufficient documentation

## 2022-04-04 DIAGNOSIS — R269 Unspecified abnormalities of gait and mobility: Secondary | ICD-10-CM | POA: Diagnosis present

## 2022-04-04 DIAGNOSIS — R2689 Other abnormalities of gait and mobility: Secondary | ICD-10-CM | POA: Diagnosis present

## 2022-04-04 DIAGNOSIS — R2681 Unsteadiness on feet: Secondary | ICD-10-CM | POA: Diagnosis present

## 2022-04-04 DIAGNOSIS — M6281 Muscle weakness (generalized): Secondary | ICD-10-CM | POA: Diagnosis present

## 2022-04-04 NOTE — Therapy (Signed)
OUTPATIENT OCCUPATIONAL THERAPY TREATMENT/Recertification NOTE   Patient Name: Lindsey [name] MRN: [id number] DOB:[birth year], 66 y.o., female Today's Date: 04/04/2022   REFERRING PROVIDER: Karie Fetch, MD   OT End of Session     Visit Number 39   Number of Visits 48    Date for OT Re-Evaluation 06/27/22    Authorization Type Progress report period starting 12/02/2021    OT Start Time 1145    OT Stop Time 1230    OT Time Calculation (min) 45 min    Activity Tolerance Patient tolerated treatment well    Behavior During Therapy WFL for tasks assessed/performed             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 No pertinent surgical history. 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) 09/24/2021   Cerebral embolism with cerebral infarction 09/23/2021   Pyelonephritis 09/19/2021   Pain due to onychomycosis of toenails of both feet 11/19/2020   Normocytic anemia 05/31/2020   Acute ischemic left MCA stroke (HCC) 04/30/2020   Right hemiplegia (HCC) 04/30/2020   Cerebrovascular accident (HCC) 04/21/2020   Atrial fibrillation with RVR (HCC) 04/21/2020   Essential hypertension 04/21/2020   Alcohol abuse 04/21/2020    RUE Measurements:   PROM Shoulder flexion:  7/03: 0(108)  5/18:  0(105)  PROM Shoulder abduction: 7/03: 9(562)  5/18: 0(102)  PROM Elbow:   7/03: 0(0-140)  5/18: 0(0-136)  AROM Wrist extension:  7/03: -13(08)  5/18: -40(72)  REFERRING DIAG: CVA  THERAPY DIAG:  Muscle weakness (generalized)  Rationale for Evaluation and Treatment Rehabilitation  PERTINENT HISTORY: Pt. is a 66 y.o. 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 Pt. has Right sided weakness, and receptive, and expressive aphasia. Pt. has had a previous CVA with right sided weakness in July 2021. PMHx includes: AFib, Renal Artery thrombosis, situational depression, HTN, Pyelnephritis, Seizure activity, COnstipation, AKI. Vitamin D deficiency, and urinary incontinence/  PRECAUTIONS:   SUBJECTIVE: Pt.'s grandson reports that the family came home from their trip to [place] to celebrate her birthday with her.   PAIN:  Are you having pain? No     OBJECTIVE:   TODAY'S TREATMENT:   Rationale for Evaluation and Treatment Rehabilitation  OT TREATMENT        Measurements were obtained, and goals were reviewed with the pt. Pt.'s FOTO score has progressed to 40. Pt. Has progressed with PROM for right shoulder flexion by 3 degrees, abduction by 2 degrees, elbow flexion by 4 degrees, and AROM wrist extension by 12 degrees. Pt. is progressing with UE dressing, LE dressing, and toileting skills. Pt. continues to work on normalizing tone, and facilitating active volitional movement in the RUE, and  hand to improve engagement of the RUE during ADLs, as well as improve transfers, and functional mobility for toileting.        PATIENT EDUCATION: Education details: RUE functioning Person educated: Patient Education method: Medical illustrator Education comprehension: verbalized understanding, returned demonstration, verbal cues required, and tactile cues required   HOME EXERCISE PROGRAM   Continue with  ongoing HEPs for ROM to the RUE, hand, and digits.    OT Short Term Goals - 02/17/22 1032       OT SHORT TERM GOAL #1   Title Pt and caregiver will be independent with HEP for RUE    Baseline Eval: No current program. 10th visit: Caregivers  report being comfortable with exercises at home. 01/06/2022: Continue ongoing HEPs for UE. 30th visit: Pt./caregivers to continue with ROM    Time 6   Period Weeks    Status On-going    Target Date 05/16/2022             OT Long Term Goals - 02/17/22 1046       OT LONG TERM GOAL #1   Title Pt. will engage engage the RUE as a gross assist/atabilizer during ADLs 100% of the time    Baseline 04/04/2022: Pt. Is using her right hand to hold the loofa, and stabilize items.30th visit: Pt. is initiating using her RUE as a gross stabilizer with maxA 01/06/2022: Pt. is starting to engage her RUE as a gross stabiliizer with increased cues. Eval: pt. does not currently engage her RUE during ADLs. 10th visit: Pt. continues to need to work towards using her RUE as a gross stabilizer.    Time 12    Status On-going    Target Date 06/27/22      OT LONG TERM GOAL #2   Title Pt. will perform UE dressing using one armed dressing techniques    Baseline Eval: MaxA, 10th visit: ModA 01/06/2022: min-modA 30th visit; min0-modA, 04/04/2022: minA donning loose shirts, MaxA tighter items/sports bra, independent doffing a shirt and bra    Time 12    Period Weeks    Status On-going    Target Date 06/27/22      OT LONG TERM GOAL #3   Title Pt. will require minA to stand and hike pants    Baseline Eval: MaxA, 10th visit: ModA 01/06/2022: ModA 30th visit: ModA 04/04/2022: independent with elastic pants if above the knees. MaxA if they fall below the knee.    Time 12    Period Weeks    Status On-going    Target Date 06/27/22      OT LONG TERM GOAL #4   Title Pt. will perform LE dressing with minA    Baseline Eval: MaxA 10th visit: Pt. requires MaxA for donning her brace,  shoes, and socks. ModA with pants. 01/06/2022: ModA pants, MaxA shoes, socks, and brace 30th visit: ModA pants, MaxA shoes, socks, and brace. 04/04/2022: MaxA   Time 12    Period Weeks    Status On-going    Target Date 06/27/22      OT LONG TERM GOAL #5   Title Pt. will improve FOTO score by 2 pt. to reflect funcational change    Baseline Eval: FOTO score: 12, 10th visit: FOTO score 27 01/06/2022: 27 30th visit TBA at next visit secondary to no family available during the session.    Time 12    Period Weeks    Status On-going    Target Date 06/27/22  OT LONG TERM GOAL #6   Title Patient will transfer to bedside commode or toilet with supervision.    Baseline 30th visit: min-modA. 01/06/2022: min-ModA 04/04/2022: Min-ModA   Time 4    Period Weeks    Status On-going    Target Date 06/27/22        OT LONG TERM GOAL #7                                                      Title: Pt. Will improve AROM wrist extension by 5 degrees in preparation for improving functional  reaching.                                                                                  Baseline:       7/03: -28(72)            Time: 4           Period: Weeks           Status: Ongoing           Target Date: 06/27/2022   Plan - 03/24/22 1429     Clinical Impression Statement Measurements were obtained, and goals were reviewed with the pt. Pt.'s FOTO score has progressed to 40. Pt. Has progressed with PROM for right shoulder flexion by 3 degrees, abduction by 2 degrees, elbow flexion by 4 degrees, and AROM wrist extension by 12 degrees. Pt. is progressing with UE dressing, LE dressing, and toileting skills. Pt. continues to work on normalizing tone, and facilitating active volitional movement in the RUE, and hand to improve engagement of the RUE during ADLs, as well as improve transfers, and functional mobility for toileting        OT Occupational Profile and History Detailed Assessment- Review of Records and  additional review of physical, cognitive, psychosocial history related to current functional performance    Occupational performance deficits (Please refer to evaluation for details): Leisure;IADL's;ADL's    Body Structure / Function / Physical Skills ADL;IADL;FMC;ROM;UE functional use;Strength;Decreased knowledge of use of DME;Coordination    Rehab Potential Fair    Clinical Decision Making Several treatment options, min-mod task modification necessary    Comorbidities Affecting Occupational Performance: May have comorbidities impacting occupational performance    Modification or Assistance to Complete Evaluation  Min-Moderate modification of tasks or assist with assess necessary to complete eval    OT Frequency 2x / week    OT Duration 12 weeks    OT Treatment/Interventions Self-care/ADL training;Neuromuscular education;Electrical Stimulation;Patient/family education;Therapeutic activities;Passive range of motion;Building services engineer;Therapeutic exercise;DME and/or AE instruction;Moist Heat    Consulted and Agree with Plan of Care Patient            Olegario Messier, MS, OTR/L   Olegario Messier, OT 04/04/2022, 11:51 AM

## 2022-04-07 ENCOUNTER — Ambulatory Visit: Payer: Medicare Other | Admitting: Physical Therapy

## 2022-04-07 ENCOUNTER — Ambulatory Visit: Payer: Medicare Other

## 2022-04-07 ENCOUNTER — Encounter: Payer: Self-pay | Admitting: Physical Therapy

## 2022-04-07 ENCOUNTER — Encounter: Payer: Medicare Other | Admitting: Speech Pathology

## 2022-04-07 DIAGNOSIS — R269 Unspecified abnormalities of gait and mobility: Secondary | ICD-10-CM

## 2022-04-07 DIAGNOSIS — R262 Difficulty in walking, not elsewhere classified: Secondary | ICD-10-CM

## 2022-04-07 DIAGNOSIS — I63542 Cerebral infarction due to unspecified occlusion or stenosis of left cerebellar artery: Secondary | ICD-10-CM

## 2022-04-07 DIAGNOSIS — R2681 Unsteadiness on feet: Secondary | ICD-10-CM

## 2022-04-07 DIAGNOSIS — R278 Other lack of coordination: Secondary | ICD-10-CM

## 2022-04-07 DIAGNOSIS — M6281 Muscle weakness (generalized): Secondary | ICD-10-CM

## 2022-04-07 NOTE — Therapy (Addendum)
OUTPATIENT PHYSICAL THERAPY TREATMENT NOTE/ Physical Therapy Progress Note   Dates of reporting period  02/17/22   to   04/07/22    Patient Name: Lindsey [name] MRN: [id number] DOB:[birth year], 66 y.o., female Today's Date: 04/07/2022  PCP: Dr. 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 No pertinent surgical history. 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 ([place]) 09/24/2021   Cerebral embolism with cerebral infarction 09/23/2021   Pyelonephritis 09/19/2021   Pain due to onychomycosis of toenails of both feet 11/19/2020   Normocytic anemia 05/31/2020   Acute ischemic left MCA stroke ([place]) 04/30/2020   Right hemiplegia ([place]) 04/30/2020   Cerebrovascular accident ([place]) 04/21/2020   Atrial fibrillation with RVR ([place]) 04/21/2020   Essential  hypertension 04/21/2020   Alcohol abuse 04/21/2020    REFERRING DIAG: s/p CVA  THERAPY DIAG:  Abnormality of gait and mobility  Difficulty in walking, not elsewhere classified  Muscle weakness (generalized)  Unsteadiness on feet  Rationale for Evaluation and Treatment Rehabilitation  PERTINENT HISTORY: Pt was doing very well and was walking short distances wqith a cane prior to second stroke in december. At this point she is limited to wheelchair as her main means of mobility. Pt caregiver reports all detailed information. Pt has hesitation with the left lower extremity and significant weakness on the right lower extremity. Pt will take 2-3 steps when transfering. Pt caregiver assists with all transfers.Pt has shower with ledge to steps over ledge when entering and utilizes bedside commode. Pt has 2 level home with chair lift for upstairs access. Pt would like to improve ransfers, improve neglect on her R side and improve her overall strength and mobility.Pt. is a 66 y.o. 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 Pt. has Right sided weakness, and receptive, and expressive aphasia. Pt. has had a previous CVA with right sided weakness in July 2021. PMHx includes: AFib, Renal Artery thrombosis, situational depression, HTN, Pyelnephritis, Seizure activity, COnstipation, AKI. Vitamin D deficiency, and urinary incontinence.  PRECAUTIONS: fall risk, wearing RLE AFO  SUBJECTIVE: Pt caregiver repots her birthday was over the weekend and she had a  good birthday.   PAIN:  Are you having pain? No     TODAY'S TREATMENT: Physical therapy treatment session today consisted of completing assessment of goals and administration of testing as demonstrated in flow sheet and in goals section of this exam. Addition treatments may be found below.       [facility] PT Assessment -        Berg Balance Test   Sit to Stand Able to stand  using hands after several tries    Standing Unsupported Able to stand 2 minutes with supervision    Sitting with Back Unsupported but Feet Supported on Floor or Stool Able to sit safely and securely 2 minutes    Stand to Sit Controls descent by using hands    Transfers Able to transfer with verbal cueing and /or supervision    Standing Unsupported with Eyes Closed Needs help to keep from falling    Standing Unsupported with Feet Together Needs help to attain position and unable to hold for 15 seconds    From Standing, Reach Forward with Outstretched Arm Reaches forward but needs supervision    From Standing Position, Pick up Object from Floor Unable to try/needs assist to keep balance    From Standing Position, Turn to Look Behind Over each Shoulder Needs supervision when turning    Turn 360 Degrees Needs assistance while turning   requires hemiwalker, prolonged time, cues for object navigation and increased space   Standing Unsupported, Alternately Place Feet on Step/Stool Needs assistance to keep from falling or unable to try    Standing Unsupported, One Foot in Front Needs help to step but can hold 15 seconds   this is her self selected position for standing   Standing on One Leg Unable to try or needs assist to prevent fall    Total Score 17    Berg comment: lots of difficulty with maintaining standing balance wihtout support            PATIENT EDUCATION: Education details: positioning/exercise Therapist, sports Person educated: Patient Education method: Consulting civil engineer, Demonstration, and Verbal cues Education comprehension: verbalized understanding, returned demonstration, and needs further education   HOME EXERCISE PROGRAM: Continue as previously given    PT Short Term Goals -       PT SHORT TERM GOAL #1   Title Patient will be independent in home exercise program to improve strength/mobility for better functional independence with ADLs.    Baseline no formal HEp for  LE/balance at this time; 02/08/2022=Patient continues to require VC for reminders and assist with hip march/knee ext (seated for strengthening); Reminders to continue to work on static stand at home.    Time 4    Period Weeks    Status Partially Met  04/07/22: complete somewhat regularly     Target Date 12/14/21              PT Long Term Goals -       PT LONG TERM GOAL #1   Title Patient will increase FOTO score to equal to or greater than   41  to demonstrate statistically significant improvement in mobility and quality of life.    Baseline 16 on 2/14; 02/08/2022=Patient unable to complete today- No family present but will attempt again next visit available with family present to assist in completion; 02/17/2022= 40    Time 12    Period Weeks    Status On-going    Target Date 05/03/22      PT  LONG TERM GOAL #3   Title Patient  will complete five times sit to stand test in < 30 seconds with UE assistance indicating an increased LE strength and improved balance.    Baseline 58.47 s with UE assist and with A from PT for R LE positioning; 02/08/2022=54 sec with use of L UE support and Min physical assist to position right LE.  7/6: 30.5 sec, min A for LE positioning, some attempts did not come to full erect posture, did not let go with L UE from armchair with any attempt    Time 12    Period Weeks    Status On-going    Target Date 05/03/22      PT LONG TERM GOAL #4   Title Patient will increase Berg Balance score by > 6 points to demonstrate decreased fall risk during functional activities.    Baseline 12 on 2/14; 02/08/2022= 13/56  04/07/22: 17     Time 12    Period Weeks    Status On-going    Target Date 05/03/22      PT LONG TERM GOAL #5   Title Pt will transfer from seated position on table/chair to/from transport chair/WC in order to indicate improved transfer ability.    Baseline Requires Min A for LE positioning and to prevent LOB. 02/08/2022=Patient attempted to perform on her  own- Unable to push herself up from chair- Very min assist to position right LE into more flexed position and VC for hand placement. Patient able to stand with min Assist with use of gait belt and VC to lean; 02/17/2022=Patient was able to transfer from transport w/c with some physical assist with Right to swing to pivot to side of chair. She was able to sit to stand today with only CGA and then stand pivot with right arm holding onto arm of chair. Only difficulty was stand to sit- Max VC not to plop into chair. 04/07/22: Pt performed movement of B LE to side of chair independently with chair transfer. Pt required min A with R LE foot placement of floor. Pt requires increased time to complete but complete with CGA only for safety and no cues or A to prevent falling/ plopping into armchair.     Time 12    Period Weeks    Status On-going    Target Date 05/03/22      PT LONG TERM GOAL #6   Title Patient will ambulate > 150 feet with CGA using Hemiwalker on level surfaces for improved household and short community distances.    Baseline 02/08/2022= 40 feet with use of HW, heavy CGA with max VC for gait sequencing and w/c follow. 5/18 = 85 feet with use of HW, close CGA  7/6: 45 feet, heavy use of hemiwalker    Time 12    Period Weeks    Status On-going    Target Date 05/03/22      PT LONG TERM GOAL #7   Title Pt will increase 10MWT by at least 0.13 m/s in order to demonstrate clinically significant improvement in community ambulation.    Baseline 02/08/2022= 0.05 m/s using HW 7/6: .055ms with heavy ue use of hemiwalker and CGA for balance    Time 12    Period Weeks    Status New    Target Date 05/03/22              Plan - 03/24/22 1002     Clinical Impression Statement Physical therapy treatment session  today consisted of completing assessment of goals and administration of testing as demonstrated in flow sheets and goals section of this exam. Pt has made significant progress with her BERG  balance test, transfer ability and 5XSTS test, however, she is still at high risk for falls based on her test results. Pt has made some progress with her ambulation but progress this date is minimal, pt did ambulate significant farther during previous progress note and her limited ambulatory distance may have been secondary to fatigue from other testing. Pt will continue to benefit from skilled physical therapy intervention to address his impairments, improve his QOL, and attain his therapy goals.  Patient's condition has the potential to improve in response to therapy. Maximum improvement is yet to be obtained. The anticipated improvement is attainable and reasonable in a generally predictable time.       Personal Factors and Comorbidities Age;Comorbidity 1;Comorbidity 2;Comorbidity 3+;Time since onset of injury/illness/exacerbation;Other    Comorbidities AFib, Renal Artery thrombosis, situational depression, HTN, Pyelnephritis, Seizure activity, COnstipation, AKI    Examination-Activity Limitations Bathing;Bed Mobility;Carry;Locomotion Level;Dressing;Sit;Squat;Stairs;Stand;Toileting;Transfers    Examination-Participation Restrictions Church;Community Activity;Laundry    Stability/Clinical Decision Making Evolving/Moderate complexity    Rehab Potential Fair    PT Frequency 2x / week    PT Duration 12 weeks    PT Treatment/Interventions ADLs/Self Care Home Management;Gait training;Stair training;Therapeutic activities;Therapeutic exercise;Balance training;Neuromuscular re-education;Patient/family education;Wheelchair mobility training;Manual techniques;Energy conservation;Dry needling;Passive range of motion;Taping;Spinal Manipulations;Joint Manipulations    PT Next Visit Plan Gait training, Transfer training, LE strengthening as appropriate.    PT Home Exercise Plan HEP: general LE and postural exercises    Consulted and Agree with Plan of Care Patient               Particia Lather, PT,  DPT 04/07/2022, 1:22 PM

## 2022-04-08 NOTE — Therapy (Signed)
OUTPATIENT OCCUPATIONAL THERAPY TREATMENT/PROGRESS NOTE   Patient Name: Lindsey [name] MRN: [id number] DOB:1956-08-09, 66 y.o., female Today's Date: 04/08/2022   REFERRING PROVIDER: Karie Fetch, MD   OT End of Session     Visit Number 40   Number of Visits 48    Date for OT Re-Evaluation 06/27/22    Authorization Type Progress report period starting 12/02/2021    OT Start Time 0938   OT Stop Time 1016    OT Time Calculation (min) 38 min    Activity Tolerance Patient tolerated treatment well    Behavior During Therapy WFL for tasks assessed/performed             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) 09/24/2021   Cerebral embolism with cerebral infarction 09/23/2021   Pyelonephritis 09/19/2021   Pain due to onychomycosis of toenails of both feet 11/19/2020   Normocytic anemia 05/31/2020   Acute ischemic left MCA stroke (HCC) 04/30/2020   Right hemiplegia (HCC) 04/30/2020   Cerebrovascular accident (HCC) 04/21/2020   Atrial fibrillation with RVR (HCC) 04/21/2020   Essential hypertension 04/21/2020   Alcohol abuse 04/21/2020    RUE Measurements (Taken 04/04/22):   PROM Shoulder flexion:  7/03: 0(108)  5/18:  0(105)  PROM Shoulder abduction: 7/03: 4(854)  5/18: 0(102)  PROM Elbow:   7/03: 0(0-140)  5/18: 0(0-136)  AROM Wrist extension:  7/03: -62(70)  5/18: -40(72)  REFERRING DIAG: CVA  THERAPY DIAG:  Muscle weakness (generalized)  Other lack of coordination  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 is a 66 y.o. 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 Pt. has Right sided weakness, and receptive, and expressive aphasia. Pt. has had a previous CVA with right sided weakness in July 2021. PMHx includes: AFib, Renal Artery thrombosis, situational depression, HTN, Pyelnephritis, Seizure activity, COnstipation, AKI. Vitamin D deficiency, and urinary incontinence/  PRECAUTIONS: Fall   SUBJECTIVE: Pt arrived with her son's Mother-in-law today.    PAIN:  Are you having pain? No     OBJECTIVE:   TODAY'S TREATMENT:   Rationale for Evaluation and Treatment Rehabilitation  OT TREATMENT     Therapeutic Exercise: Performed PROM throughout R shoulder, elbow, wrist, and hand all planes to reduce stiffness in prep for ADLs and transfers.  Self Care: Practiced LB dressing strategies with and without AE to don/doff sock and shoe on L foot.  Pt initially struggled without long handled devices, so OT provided instruction in long shoe horn and reacher, but then pt worked through reaching  to feet and crossing L foot over R to don/doff without equipment after understanding directions for task.  Practiced sit to stand from arm chair and reaching below knee level to simulate when pants drop and require hiking after toileting.  Pt able to maintain balance with min guard-min A on 3 sets of 3 trials of reaching.  Pt performed sit to stand transfers with min guard-min A, and tactile cues for hand  placement on arm rest of chair in prep for push up to standing.       PATIENT EDUCATION: Education details: dressing techniques Person educated: Patient Education method: Customer service manager Education comprehension: verbalized understanding, returned demonstration, verbal cues required, and tactile cues required   Eldred with ongoing HEPs for ROM to the RUE, hand, and digits.    OT Short Term Goals -       OT SHORT TERM GOAL #1   Title Pt and caregiver will be independent with HEP for RUE    Baseline Eval: No current program. 10th visit: Caregivers  report being comfortable with exercises at home. 01/06/2022: Continue ongoing HEPs for UE. 30th visit: Pt./caregivers to continue with ROM; 40th: pt/caregivers to continue with PROM throughout RUE   Time 6   Period Weeks    Status On-going    Target Date 05/16/2022             OT Long Term Goals -       OT LONG TERM GOAL #1   Title Pt. will engage the RUE as a gross assist/atabilizer during ADLs 100% of the time    Baseline 04/04/2022: Pt. Is using her right hand to hold the loofa, and stabilize items.30th visit: Pt. is initiating using her RUE as a gross stabilizer with maxA 01/06/2022: Pt. is starting to engage her RUE as a gross stabiliizer with increased cues. Eval: pt. does not currently engage her RUE during ADLs. 10th visit: Pt. continues to need to work towards using her RUE as a gross stabilizer.    Time 12    Status On-going    Target Date 06/27/22      OT LONG TERM GOAL #2   Title Pt. will perform UE dressing using one armed dressing techniques    Baseline Eval: MaxA, 10th visit: [place] 01/06/2022: min-modA 30th visit; min0-modA, 04/04/2022: minA donning loose shirts, MaxA tighter items/sports bra, independent doffing a shirt and bra    Time 12    Period Weeks    Status On-going    Target Date 06/27/22      OT LONG TERM GOAL #3   Title Pt. will require minA to stand and hike pants     Baseline Eval: MaxA, 10th visit: [place] 01/06/2022: [employer] 30th visit: [place] 04/04/2022: independent with elastic pants if above the knees. MaxA if they fall below the knee; 04/07/22: min guard-min A to reach below knees to simulate hiking pants   Time 12    Period Weeks    Status On-going    Target Date 06/27/22      OT LONG TERM GOAL #4   Title Pt. will perform LE dressing with minA    Baseline Eval: MaxA 10th visit: Pt. requires MaxA for donning her brace, shoes, and socks. ModA with pants. 01/06/2022: ModA pants, MaxA shoes, socks, and brace 30th visit: ModA pants, MaxA shoes, socks, and brace. 04/04/2022: MaxA; 04/07/22: pt can don/doff L sock and shoe with supv, dep for RLE; see above for hiking  pants   Time 12    Period Weeks    Status On-going    Target Date 06/27/22      OT LONG TERM GOAL #5   Title Pt. will improve FOTO score by 2 pt. to reflect funcational change    Baseline Eval: FOTO score: 12, 10th visit: FOTO score 27 01/06/2022: 27 30th visit TBA at next visit secondary to no family available during the session; 04/04/22: FOTO 40   Time 12    Period Weeks    Status On-going    Target Date 06/27/22      OT LONG TERM GOAL #6   Title Patient will transfer to bedside commode or toilet with supervision.    Baseline 30th visit: min-modA. 01/06/2022: min-ModA 04/04/2022: Min-ModA   Time 4    Period Weeks    Status On-going    Target Date 06/27/22        OT LONG TERM GOAL #7                                                      Title: Pt. Will improve AROM wrist extension by 5 degrees in preparation for improving functional  reaching.                                                                                  Baseline:       7/03: -28(72)            Time: 4           Period: Weeks           Status: Ongoing           Target Date: 06/27/2022   Plan - 03/24/22 1429     Clinical Impression Statement Pt continues to progress with UE dressing, LE dressing, and toileting skills.  Pt  performed sit to stand transfers today consistently with min guard-min A in prep for Adls.  Pt demonstrated ability to reach below knees on 3 sets of 3 reps with min guard-min A for balance to simulate hiking pants. Pt. continues to work on normalizing tone, and facilitating active volitional movement in the RUE, and hand to improve engagement of the RUE during ADLs, as well as improve transfers, and functional mobility for toileting        OT Occupational Profile and History Detailed Assessment- Review of Records and additional review of physical, cognitive, psychosocial history related to current functional performance    Occupational performance deficits (Please refer to evaluation for details): Leisure;IADL's;ADL's    Body Structure / Function / Physical Skills ADL;IADL;FMC;ROM;UE functional use;Strength;Decreased knowledge of use of DME;Coordination    Rehab Potential Fair    Clinical Decision Making Several treatment options, min-mod task modification necessary    Comorbidities Affecting Occupational Performance: May have comorbidities impacting occupational performance    Modification or Assistance to Complete Evaluation  Min-Moderate modification of tasks or assist with assess necessary to complete eval    OT Frequency 2x / week  OT Duration 12 weeks    OT Treatment/Interventions Self-care/ADL training;Neuromuscular education;Electrical Stimulation;Patient/family education;Therapeutic activities;Passive range of motion;Building services engineer;Therapeutic exercise;DME and/or AE instruction;Moist Heat    Consulted and Agree with Plan of Care Patient            Danelle Earthly, MS, OTR/L   Otis Dials, OT 04/08/2022, 8:15 PM

## 2022-04-11 ENCOUNTER — Encounter: Payer: Medicare Other | Admitting: Speech Pathology

## 2022-04-11 ENCOUNTER — Encounter: Payer: Self-pay | Admitting: Physical Therapy

## 2022-04-11 ENCOUNTER — Ambulatory Visit: Payer: Medicare Other

## 2022-04-11 ENCOUNTER — Ambulatory Visit: Payer: Medicare Other | Admitting: Physical Therapy

## 2022-04-11 DIAGNOSIS — M6281 Muscle weakness (generalized): Secondary | ICD-10-CM

## 2022-04-11 DIAGNOSIS — R262 Difficulty in walking, not elsewhere classified: Secondary | ICD-10-CM

## 2022-04-11 DIAGNOSIS — R2681 Unsteadiness on feet: Secondary | ICD-10-CM

## 2022-04-11 DIAGNOSIS — R269 Unspecified abnormalities of gait and mobility: Secondary | ICD-10-CM

## 2022-04-11 DIAGNOSIS — I63542 Cerebral infarction due to unspecified occlusion or stenosis of left cerebellar artery: Secondary | ICD-10-CM

## 2022-04-11 DIAGNOSIS — R278 Other lack of coordination: Secondary | ICD-10-CM

## 2022-04-11 NOTE — Therapy (Signed)
OUTPATIENT OCCUPATIONAL THERAPY TREATMENT NOTE   Patient Name: Lindsey [name] MRN: [id number] DOB:July 09, 1956, 65 y.o., female Today's Date: 04/11/2022   REFERRING PROVIDER: Karie Fetch, MD   OT End of Session     Visit Number 41   Number of Visits 48    Date for OT Re-Evaluation 06/27/22    Authorization Type Progress report period starting 12/02/2021    OT Start Time 1145   OT Stop Time 1230   OT Time Calculation (min) 45 min    Activity Tolerance Patient tolerated treatment well    Behavior During Therapy WFL for tasks assessed/performed             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 No pertinent surgical history. 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) 09/24/2021   Cerebral embolism with cerebral infarction 09/23/2021   Pyelonephritis 09/19/2021   Pain due to onychomycosis of toenails of both feet 11/19/2020   Normocytic anemia 05/31/2020   Acute ischemic left MCA stroke (HCC) 04/30/2020   Right hemiplegia (HCC) 04/30/2020   Cerebrovascular accident (HCC) 04/21/2020   Atrial fibrillation with RVR (HCC) 04/21/2020   Essential hypertension 04/21/2020   Alcohol abuse 04/21/2020    RUE Measurements (Taken 04/04/22):   PROM Shoulder flexion:  7/03: 0(108)  5/18:  0(105)  PROM Shoulder abduction: 7/03: 2(025)  5/18: 0(102)  PROM Elbow:   7/03: 0(0-140)  5/18: 0(0-136)  AROM Wrist extension:  7/03: -42(70)  5/18: -40(72)  REFERRING DIAG: CVA  THERAPY DIAG:  Muscle weakness (generalized)  Other lack of coordination  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 is a 66 y.o. 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 Pt. has Right sided weakness, and receptive, and expressive aphasia. Pt. has had a previous CVA with right sided weakness in July 2021. PMHx includes: AFib, Renal Artery thrombosis, situational depression, HTN, Pyelnephritis, Seizure activity, COnstipation, AKI. Vitamin D deficiency, and urinary incontinence/  PRECAUTIONS: Fall   SUBJECTIVE: Pt was very motivated to participate and reports no soreness today.    PAIN:  Are you having pain? No     OBJECTIVE:   TODAY'S TREATMENT:   Rationale for Evaluation and Treatment Rehabilitation  OT TREATMENT    04/11/22 Therapeutic Exercise: Performed PROM throughout R shoulder, elbow, wrist, and hand all planes to reduce stiffness in prep for ADLs and transfers. Completed x20 scapular retraction sets in sitting with min cues for technique. Pt performed AAROM seated using the UE Ranger for shoulder protraction, retraction, external rotation, elbow flexion, and extension. Utilizes L hand to assist with  repositioning R hand and to complete full ROM in ranger.   Self Care:  Pt performed x2 stand pivot transfers with MOD A - improved safety awareness, gets R foot in good position prior to pivoting. MIN A x4 sit<>stand transfers, braces B knees against chair. Decreases to MOD A to hike pants up in standing using L hand - assist for balance without UE support.    04/04/22  Therapeutic Exercise: Performed PROM throughout R shoulder, elbow, wrist, and hand all planes to reduce stiffness in prep for ADLs and transfers.  Self Care: Practiced LB dressing strategies with and without AE to don/doff sock and shoe on L foot.  Pt initially struggled without long handled devices, so OT provided instruction in long shoe horn and reacher, but then pt worked through reaching to feet and crossing L foot over R to don/doff without equipment after understanding directions for task.  Practiced sit to stand from arm chair and reaching below knee level to simulate when pants drop and require hiking after toileting.  Pt able to maintain balance with min guard-min A on 3 sets of 3 trials of reaching.  Pt performed sit to stand transfers with min guard-min A, and tactile cues for hand placement on arm rest of chair in prep for push up to standing.       PATIENT EDUCATION: Education details: dressing techniques, HEP Person educated: Patient Education method: Medical illustrator Education comprehension: verbalized understanding, returned demonstration, verbal cues required, and tactile cues required   HOME EXERCISE PROGRAM   Continue with ongoing HEPs for ROM to the RUE, hand, and digits.    OT Short Term Goals -       OT SHORT TERM GOAL #1   Title Pt and caregiver will be independent with HEP for RUE    Baseline Eval: No current program. 10th visit: Caregivers  report being comfortable with exercises at home. 01/06/2022: Continue ongoing HEPs for UE. 30th visit: Pt./caregivers to continue with ROM; 40th: pt/caregivers to continue with PROM throughout RUE   Time 6   Period Weeks    Status On-going    Target Date 05/16/2022             OT Long Term Goals -       OT LONG TERM GOAL #1   Title Pt. will engage the RUE as a gross assist/atabilizer during ADLs 100% of the time    Baseline 04/04/2022: Pt. Is using her right hand to hold the loofa, and  stabilize items.30th visit: Pt. is initiating using her RUE as a gross stabilizer with maxA 01/06/2022: Pt. is starting to engage her RUE as a gross stabiliizer with increased cues. Eval: pt. does not currently engage her RUE during ADLs. 10th visit: Pt. continues to need to work towards using her RUE as a gross stabilizer.    Time 12    Status On-going    Target Date 06/27/22      OT LONG TERM GOAL #2   Title Pt. will perform UE dressing using one armed dressing techniques    Baseline Eval: MaxA, 10th visit: ModA 01/06/2022: min-modA 30th visit; min0-modA, 04/04/2022: minA donning loose shirts, MaxA tighter items/sports bra, independent doffing a shirt and bra    Time 12    Period Weeks    Status On-going    Target Date 06/27/22      OT LONG TERM GOAL #3   Title Pt. will require minA to stand and hike pants  Baseline Eval: MaxA, 10th visit: ModA 01/06/2022: ModA 30th visit: ModA 04/04/2022: independent with elastic pants if above the knees. MaxA if they fall below the knee; 04/07/22: min guard-min A to reach below knees to simulate hiking pants   Time 12    Period Weeks    Status On-going    Target Date 06/27/22      OT LONG TERM GOAL #4   Title Pt. will perform LE dressing with minA    Baseline Eval: MaxA 10th visit: Pt. requires MaxA for donning her brace, shoes, and socks. ModA with pants. 01/06/2022: ModA pants, MaxA shoes, socks, and brace 30th visit: ModA pants, MaxA shoes, socks, and brace. 04/04/2022: MaxA; 04/07/22: pt can don/doff L sock and shoe with supv, dep for RLE; see above for hiking pants   Time 12    Period Weeks    Status On-going    Target Date 06/27/22      OT LONG TERM GOAL #5   Title Pt. will improve FOTO score by 2 pt. to reflect funcational change    Baseline Eval: FOTO score: 12, 10th visit: FOTO score 27 01/06/2022: 27 30th visit TBA at next visit secondary to no family available during the session; 04/04/22: FOTO 40   Time 12    Period Weeks    Status On-going     Target Date 06/27/22      OT LONG TERM GOAL #6   Title Patient will transfer to bedside commode or toilet with supervision.    Baseline 30th visit: min-modA. 01/06/2022: min-ModA 04/04/2022: Min-ModA   Time 4    Period Weeks    Status On-going    Target Date 06/27/22        OT LONG TERM GOAL #7                                                      Title: Pt. Will improve AROM wrist extension by 5 degrees in preparation for improving functional  reaching.                                                                                  Baseline:       7/03: -28(72)            Time: 4           Period: Weeks           Status: Ongoing           Target Date: 06/27/2022   Plan - 03/24/22 1429     Clinical Impression Statement Pt performed x2 stand pivot transfers with MOD A - improved safety awareness, gets R foot in good position prior to pivoting. MIN A x4 sit<>stand transfers, braces B knees against chair. Good technique and motivation to use UE Ranger for ROM exercises. Pt. continues to work on normalizing tone, and facilitating active volitional movement in the RUE, and hand to improve engagement of the RUE during ADLs, as well as improve transfers, and functional mobility  for toileting        OT Occupational Profile and History Detailed Assessment- Review of Records and additional review of physical, cognitive, psychosocial history related to current functional performance    Occupational performance deficits (Please refer to evaluation for details): Leisure;IADL's;ADL's    Body Structure / Function / Physical Skills ADL;IADL;FMC;ROM;UE functional use;Strength;Decreased knowledge of use of DME;Coordination    Rehab Potential Fair    Clinical Decision Making Several treatment options, min-mod task modification necessary    Comorbidities Affecting Occupational Performance: May have comorbidities impacting occupational performance    Modification or Assistance to Complete Evaluation   Min-Moderate modification of tasks or assist with assess necessary to complete eval    OT Frequency 2x / week    OT Duration 12 weeks    OT Treatment/Interventions Self-care/ADL training;Neuromuscular education;Electrical Stimulation;Patient/family education;Therapeutic activities;Passive range of motion;Therapist, nutritional;Therapeutic exercise;DME and/or AE instruction;Moist Heat    Consulted and Agree with Plan of Care Patient            Dessie Coma, M.S. OTR/L  04/11/22, 12:14 PM  ascom [phone]    Leonides Cave, OT 04/11/2022, 12:14 PM

## 2022-04-11 NOTE — Therapy (Signed)
OUTPATIENT PHYSICAL THERAPY TREATMENT NOTE   Patient Name: Lindsey [name] MRN: [id number] DOB:[birth year], 66 y.o., female Today's Date: 04/11/2022  PCP: Dr. 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 No pertinent surgical history. 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 ([place]) 09/24/2021   Cerebral embolism with cerebral infarction 09/23/2021   Pyelonephritis 09/19/2021   Pain due to onychomycosis of toenails of both feet 11/19/2020   Normocytic anemia 05/31/2020   Acute ischemic left MCA stroke ([place]) 04/30/2020   Right hemiplegia ([place]) 04/30/2020   Cerebrovascular accident ([place]) 04/21/2020   Atrial fibrillation with RVR ([place]) 04/21/2020   Essential hypertension  04/21/2020   Alcohol abuse 04/21/2020    REFERRING DIAG: s/p CVA  THERAPY DIAG:  Abnormality of gait and mobility  Difficulty in walking, not elsewhere classified  Muscle weakness (generalized)  Unsteadiness on feet  Rationale for Evaluation and Treatment Rehabilitation  PERTINENT HISTORY: Pt was doing very well and was walking short distances wqith a cane prior to second stroke in december. At this point she is limited to wheelchair as her main means of mobility. Pt caregiver reports all detailed information. Pt has hesitation with the left lower extremity and significant weakness on the right lower extremity. Pt will take 2-3 steps when transfering. Pt caregiver assists with all transfers.Pt has shower with ledge to steps over ledge when entering and utilizes bedside commode. Pt has 2 level home with chair lift for upstairs access. Pt would like to improve ransfers, improve neglect on her R side and improve her overall strength and mobility.Pt. is a 66 y.o. 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 Pt. has Right sided weakness, and receptive, and expressive aphasia. Pt. has had a previous CVA with right sided weakness in July 2021. PMHx includes: AFib, Renal Artery thrombosis, situational depression, HTN, Pyelnephritis, Seizure activity, COnstipation, AKI. Vitamin D deficiency, and urinary incontinence.  PRECAUTIONS: fall risk, wearing RLE AFO  SUBJECTIVE: Pt reports doing well. She states HEP is going  good. No new concerns. No new falls. Daughter reports patient does a little walking but not much because they aren't confident with her stability;   Daughter present at treatment session;   PAIN:  Are you having pain? No     TODAY'S TREATMENT:    INTERVENTIONS:   Pt transferred wheelchair to nustep, stand pivot, min A with min VCs for hand placement and positioning, pt does require increased time but was  able to exhibit good R LE control with less A this session   Therex:  Nustep with R LE in hip stabilizer to prevent hip ER. NO UE use to maximize R LE use. Level 1, seat level 9. 2 x 3 minute with 1:30 rest break between sets  -30 SPM average   Seated   Hamstring curl RTB R side only x 10   Heel slide with slider R  LE x 10, Min A for end range and cues for rep count   Pt required verbal cues for correct exercise technique. She often compensates for LE weakness with increased trunk movement to facilitate increased LE ROM.  Therapeutic activity:   Performed STS from nustep with hemiwalker, ambulated to // bars and performed stand to sit transfer. Completed safely but with poor eccentric control.   R LE step to theraband pad for external cue for step length and target. Difficulty with proper placment and with return to start position. 2 sets x 5 reps each    Patient performed sit<>Stand from regular chair  X 5 reps, with // bars for balance       Ambulated in // bars x 5 laps with turns at each end. On lst trip through pt transitioned to hemiwalker and ambulates to transport chair. In // bars pt demonstrates improved step length and stability compared to hemiwalker.      Education provided throughout session via VC/TC and demonstration to facilitate movement at target joints and correct muscle activation for all testing and exercises performed.      PATIENT EDUCATION: Education details: positioning/exercise Therapist, sports Person educated: Patient Education method: Education officer, environmental, and Verbal cues Education comprehension: verbalized understanding, returned demonstration, and needs further education   HOME EXERCISE PROGRAM: Continue as previously given;    PT Short Term Goals -       PT SHORT TERM GOAL #1   Title Patient will be independent in home exercise program to improve strength/mobility for better functional independence with ADLs.    Baseline no  formal HEp for LE/balance at this time; 02/08/2022=Patient continues to require VC for reminders and assist with hip march/knee ext (seated for strengthening); Reminders to continue to work on static stand at home.    Time 4    Period Weeks    Status Partially Met  04/07/22: complete somewhat regularly     Target Date 12/14/21              PT Long Term Goals -       PT LONG TERM GOAL #1   Title Patient will increase FOTO score to equal to or greater than   41  to demonstrate statistically significant improvement in mobility and quality of life.    Baseline 16 on 2/14; 02/08/2022=Patient unable to complete today- No family present but will attempt again next visit available with family present to assist in completion; 02/17/2022= 40    Time 12    Period Weeks    Status On-going    Target Date 05/03/22  PT LONG TERM GOAL #3   Title Patient  will complete five times sit to stand test in < 30 seconds with UE assistance indicating an increased LE strength and improved balance.    Baseline 58.47 s with UE assist and with A from PT for R LE positioning; 02/08/2022=54 sec with use of L UE support and Min physical assist to position right LE.  7/6: 30.5 sec, min A for LE positioning, some attempts did not come to full erect posture, did not let go with L UE from armchair with any attempt    Time 12    Period Weeks    Status On-going    Target Date 05/03/22      PT LONG TERM GOAL #4   Title Patient will increase Berg Balance score by > 6 points to demonstrate decreased fall risk during functional activities.    Baseline 12 on 2/14; 02/08/2022= 13/56  04/07/22: 17     Time 12    Period Weeks    Status On-going    Target Date 05/03/22      PT LONG TERM GOAL #5   Title Pt will transfer from seated position on table/chair to/from transport chair/WC in order to indicate improved transfer ability.    Baseline Requires Min A for LE positioning and to prevent LOB. 02/08/2022=Patient attempted to  perform on her own- Unable to push herself up from chair- Very min assist to position right LE into more flexed position and VC for hand placement. Patient able to stand with min Assist with use of gait belt and VC to lean; 02/17/2022=Patient was able to transfer from transport w/c with some physical assist with Right to swing to pivot to side of chair. She was able to sit to stand today with only CGA and then stand pivot with right arm holding onto arm of chair. Only difficulty was stand to sit- Max VC not to plop into chair. 04/07/22: Pt performed movement of B LE to side of chair independently with chair transfer. Pt required min A with R LE foot placement of floor. Pt requires increased time to complete but complete with CGA only for safety and no cues or A to prevent falling/ plopping into armchair.     Time 12    Period Weeks    Status On-going    Target Date 05/03/22      PT LONG TERM GOAL #6   Title Patient will ambulate > 150 feet with CGA using Hemiwalker on level surfaces for improved household and short community distances.    Baseline 02/08/2022= 40 feet with use of HW, heavy CGA with max VC for gait sequencing and w/c follow. 5/18 = 85 feet with use of HW, close CGA  7/6: 45 feet, heavy use of hemiwalker    Time 12    Period Weeks    Status On-going    Target Date 05/03/22      PT LONG TERM GOAL #7   Title Pt will increase 10MWT by at least 0.13 m/s in order to demonstrate clinically significant improvement in community ambulation.    Baseline 02/08/2022= 0.05 m/s using HW 7/6: .072ms with heavy ue use of hemiwalker and CGA for balance    Time 12    Period Weeks    Status Ongoing     Target Date 05/03/22             Plan -     Clinical Impression Statement Continued with current plan of  care as laid out in evaluation and recent prior sessions. Pt remains motivated to advance progress toward goals in order to maximize independence and safety at home. Pt requires high level  assistance and cuing for completion of exercises in order to provide adequate level of stimulation and perturbation.  Pt continues to have difficulty with transfer movements and functional activities like sit to and from stand safely and in controlled fashion. Pt was better able to control involved LE with gait activities in // bars this session compared to ambulating with hemiwalker. Pt will continue to benefit from skilled physical therapy intervention to address impairments, improve QOL, and attain therapy goals.       Personal Factors and Comorbidities Age;Comorbidity 1;Comorbidity 2;Comorbidity 3+;Time since onset of injury/illness/exacerbation;Other    Comorbidities AFib, Renal Artery thrombosis, situational depression, HTN, Pyelnephritis, Seizure activity, COnstipation, AKI    Examination-Activity Limitations Bathing;Bed Mobility;Carry;Locomotion Level;Dressing;Sit;Squat;Stairs;Stand;Toileting;Transfers    Examination-Participation Restrictions Church;Community Activity;Laundry    Stability/Clinical Decision Making Evolving/Moderate complexity    Rehab Potential Fair    PT Frequency 2x / week    PT Duration 12 weeks    PT Treatment/Interventions ADLs/Self Care Home Management;Gait training;Stair training;Therapeutic activities;Therapeutic exercise;Balance training;Neuromuscular re-education;Patient/family education;Wheelchair mobility training;Manual techniques;Energy conservation;Dry needling;Passive range of motion;Taping;Spinal Manipulations;Joint Manipulations    PT Next Visit Plan Gait training, Transfer training, LE strengthening as appropriate.    PT Home Exercise Plan HEP: general LE and postural exercises    Consulted and Agree with Plan of Care Patient               Particia Lather, PT, DPT 04/11/2022, 12:27 PM

## 2022-04-14 ENCOUNTER — Encounter: Payer: Medicare Other | Admitting: Speech Pathology

## 2022-04-14 ENCOUNTER — Ambulatory Visit: Payer: Medicare Other | Admitting: Occupational Therapy

## 2022-04-14 ENCOUNTER — Encounter: Payer: Self-pay | Admitting: Physical Therapy

## 2022-04-14 ENCOUNTER — Ambulatory Visit: Payer: Medicare Other | Admitting: Physical Therapy

## 2022-04-14 DIAGNOSIS — M6281 Muscle weakness (generalized): Secondary | ICD-10-CM

## 2022-04-14 DIAGNOSIS — R269 Unspecified abnormalities of gait and mobility: Secondary | ICD-10-CM

## 2022-04-14 DIAGNOSIS — R2681 Unsteadiness on feet: Secondary | ICD-10-CM

## 2022-04-14 DIAGNOSIS — R262 Difficulty in walking, not elsewhere classified: Secondary | ICD-10-CM

## 2022-04-14 NOTE — Therapy (Signed)
OUTPATIENT PHYSICAL THERAPY TREATMENT NOTE   Patient Name: Lindsey [name] MRN: [id number] DOB:07/05/1956, 66 y.o., female Today's Date: 04/14/2022  PCP: Dr. 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 No pertinent surgical history. 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 ([place]) 09/24/2021   Cerebral embolism with cerebral infarction 09/23/2021   Pyelonephritis 09/19/2021   Pain due to onychomycosis of toenails of both feet 11/19/2020   Normocytic anemia 05/31/2020   Acute ischemic left MCA stroke ([place]) 04/30/2020   Right hemiplegia ([place]) 04/30/2020   Cerebrovascular accident ([place]) 04/21/2020   Atrial fibrillation with RVR ([place]) 04/21/2020   Essential hypertension  04/21/2020   Alcohol abuse 04/21/2020    REFERRING DIAG: s/p CVA  THERAPY DIAG:  Abnormality of gait and mobility  Difficulty in walking, not elsewhere classified  Muscle weakness (generalized)  Unsteadiness on feet  Rationale for Evaluation and Treatment Rehabilitation  PERTINENT HISTORY: Pt was doing very well and was walking short distances wqith a cane prior to second stroke in december. At this point she is limited to wheelchair as her main means of mobility. Pt caregiver reports all detailed information. Pt has hesitation with the left lower extremity and significant weakness on the right lower extremity. Pt will take 2-3 steps when transfering. Pt caregiver assists with all transfers.Pt has shower with ledge to steps over ledge when entering and utilizes bedside commode. Pt has 2 level home with chair lift for upstairs access. Pt would like to improve ransfers, improve neglect on her R side and improve her overall strength and mobility.Pt. is a 66 y.o. 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 Pt. has Right sided weakness, and receptive, and expressive aphasia. Pt. has had a previous CVA with right sided weakness in July 2021. PMHx includes: AFib, Renal Artery thrombosis, situational depression, HTN, Pyelnephritis, Seizure activity, COnstipation, AKI. Vitamin D deficiency, and urinary incontinence.  PRECAUTIONS: fall risk, wearing RLE AFO  SUBJECTIVE: Pt reports doing well. She states HEP is  going good. No new concerns. No new falls. Daughter reports patient does a little walking but not much because they aren't confident with her stability;   Daughter present at treatment session;   PAIN:  Are you having pain? No     TODAY'S TREATMENT:    INTERVENTIONS:   Pt transferred wheelchair to nustep, stand pivot, min A with min VCs for hand placement and positioning, pt does require increased time but was  able to exhibit good R LE control with less A this session TE Seated   Hamstring curl RTB R side only x 10   Heel slide with slider R  LE x 10, Mod A for end range and cues for rep count   Pt required verbal cues for correct exercise technique. She often compensates for LE weakness with increased trunk movement to facilitate increased LE ROM.   Therapeutic activity:   Performed STS from armchair with UE assist via // bar on L side. Cues for slow eccentric control. X 5 repetitions.     -Ambulated in // bars x 4 laps with turns at each end.  Difficulty with turn sequencing. Cues fr keeping LE at appropriate step width with external focus of line in middle of parallel bars.    R LE step up to 2 inch step x 10 reps, cues for step length, unable to get foot fully on step with each rep.    Pt continues to have difficulty with sequencing of tasks such as sit to stand and turning while standing   Education provided throughout session via VC/TC and demonstration to facilitate movement at target joints and correct muscle activation for all testing and exercises performed.    Pt requested to leave early from appointment due to another engagement so treatment session cut short as a result.    PATIENT EDUCATION: Education details: positioning/exercise Therapist, sports Person educated: Patient Education method: Education officer, environmental, and Verbal cues Education comprehension: verbalized understanding, returned demonstration, and needs further education   HOME EXERCISE PROGRAM: Continue as previously given;    PT Short Term Goals -       PT SHORT TERM GOAL #1   Title Patient will be independent in home exercise program to improve strength/mobility for better functional independence with ADLs.    Baseline no formal HEp for LE/balance at this time; 02/08/2022=Patient continues to require VC for reminders and assist with hip march/knee ext (seated for strengthening); Reminders to  continue to work on static stand at home.    Time 4    Period Weeks    Status Partially Met  04/07/22: complete somewhat regularly     Target Date 12/14/21              PT Long Term Goals -       PT LONG TERM GOAL #1   Title Patient will increase FOTO score to equal to or greater than   41  to demonstrate statistically significant improvement in mobility and quality of life.    Baseline 16 on 2/14; 02/08/2022=Patient unable to complete today- No family present but will attempt again next visit available with family present to assist in completion; 02/17/2022= 40    Time 12    Period Weeks    Status On-going    Target Date 05/03/22      PT LONG TERM GOAL #3   Title Patient  will complete five times sit to stand test in < 30 seconds with UE assistance indicating an increased LE  strength and improved balance.    Baseline 58.47 s with UE assist and with A from PT for R LE positioning; 02/08/2022=54 sec with use of L UE support and Min physical assist to position right LE.  7/6: 30.5 sec, min A for LE positioning, some attempts did not come to full erect posture, did not let go with L UE from armchair with any attempt    Time 12    Period Weeks    Status On-going    Target Date 05/03/22      PT LONG TERM GOAL #4   Title Patient will increase Berg Balance score by > 6 points to demonstrate decreased fall risk during functional activities.    Baseline 12 on 2/14; 02/08/2022= 13/56  04/07/22: 17     Time 12    Period Weeks    Status On-going    Target Date 05/03/22      PT LONG TERM GOAL #5   Title Pt will transfer from seated position on table/chair to/from transport chair/WC in order to indicate improved transfer ability.    Baseline Requires Min A for LE positioning and to prevent LOB. 02/08/2022=Patient attempted to perform on her own- Unable to push herself up from chair- Very min assist to position right LE into more flexed position and VC for hand placement. Patient able to stand  with min Assist with use of gait belt and VC to lean; 02/17/2022=Patient was able to transfer from transport w/c with some physical assist with Right to swing to pivot to side of chair. She was able to sit to stand today with only CGA and then stand pivot with right arm holding onto arm of chair. Only difficulty was stand to sit- Max VC not to plop into chair. 04/07/22: Pt performed movement of B LE to side of chair independently with chair transfer. Pt required min A with R LE foot placement of floor. Pt requires increased time to complete but complete with CGA only for safety and no cues or A to prevent falling/ plopping into armchair.     Time 12    Period Weeks    Status On-going    Target Date 05/03/22      PT LONG TERM GOAL #6   Title Patient will ambulate > 150 feet with CGA using Hemiwalker on level surfaces for improved household and short community distances.    Baseline 02/08/2022= 40 feet with use of HW, heavy CGA with max VC for gait sequencing and w/c follow. 5/18 = 85 feet with use of HW, close CGA  7/6: 45 feet, heavy use of hemiwalker    Time 12    Period Weeks    Status On-going    Target Date 05/03/22      PT LONG TERM GOAL #7   Title Pt will increase 10MWT by at least 0.13 m/s in order to demonstrate clinically significant improvement in community ambulation.    Baseline 02/08/2022= 0.05 m/s using HW 7/6: .08ms with heavy ue use of hemiwalker and CGA for balance    Time 12    Period Weeks    Status Ongoing     Target Date 05/03/22             Plan -     Clinical Impression Statement Continued with current plan of care as laid out in evaluation and recent prior sessions. Pt remains motivated to advance progress toward goals in order to maximize independence and safety at home. Pt requires  high level assistance and cuing for completion of exercises in order to provide adequate level of stimulation and perturbation.  Pt continues to have difficulty with transfer  movements and functional activities like sit to and from stand safely and in controlled fashion. Pt was better able to control involved LE with gait activities in // bars this session compared to ambulating with hemiwalker. Pt still having increased difficulty with turns in // bars and with transfers.  Pt will continue to benefit from skilled physical therapy intervention to address impairments, improve QOL, and attain therapy goals.       Personal Factors and Comorbidities Age;Comorbidity 1;Comorbidity 2;Comorbidity 3+;Time since onset of injury/illness/exacerbation;Other    Comorbidities AFib, Renal Artery thrombosis, situational depression, HTN, Pyelnephritis, Seizure activity, COnstipation, AKI    Examination-Activity Limitations Bathing;Bed Mobility;Carry;Locomotion Level;Dressing;Sit;Squat;Stairs;Stand;Toileting;Transfers    Examination-Participation Restrictions Church;Community Activity;Laundry    Stability/Clinical Decision Making Evolving/Moderate complexity    Rehab Potential Fair    PT Frequency 2x / week    PT Duration 12 weeks    PT Treatment/Interventions ADLs/Self Care Home Management;Gait training;Stair training;Therapeutic activities;Therapeutic exercise;Balance training;Neuromuscular re-education;Patient/family education;Wheelchair mobility training;Manual techniques;Energy conservation;Dry needling;Passive range of motion;Taping;Spinal Manipulations;Joint Manipulations    PT Next Visit Plan Gait training, Transfer training, LE strengthening as appropriate.    PT Home Exercise Plan HEP: general LE and postural exercises    Consulted and Agree with Plan of Care Patient               Particia Lather, PT, DPT 04/14/2022, 1:26 PM

## 2022-04-14 NOTE — Therapy (Signed)
OUTPATIENT OCCUPATIONAL THERAPY TREATMENT NOTE   Patient Name: Lindsey [name] MRN: [id number] DOB:12/02/1955, 66 y.o., female, female Today's Date: 04/14/2022   REFERRING PROVIDER: Karie [name], Ngwe, MD   OT End of Session     Visit Number 42   Number of Visits 48    Date for OT Re-Evaluation 06/27/22    Authorization Type Progress report period starting 12/02/2021    OT Start Time 1105   OT Stop Time 1145   OT Time Calculation (min) 45 min    Activity Tolerance Patient tolerated treatment well    Behavior During Therapy WFL for tasks assessed/performed             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) 09/24/2021   Cerebral embolism with cerebral infarction 09/23/2021   Pyelonephritis 09/19/2021   Pain due to onychomycosis of toenails of both feet 11/19/2020   Normocytic anemia 05/31/2020   Acute ischemic left MCA stroke (HCC) 04/30/2020   Right hemiplegia (HCC) 04/30/2020   Cerebrovascular accident (HCC) 04/21/2020   Atrial fibrillation with RVR (HCC) 04/21/2020   Essential hypertension 04/21/2020   Alcohol abuse 04/21/2020    RUE Measurements (Taken 04/04/22):   PROM Shoulder flexion:  7/03: 0(108)  5/18: 0(105)  PROM  Shoulder abduction: 7/03: 4(0980(104)  5/18: 0(102)  PROM Elbow:   7/03: 0(0-140)  5/18: 0(0-136)  AROM Wrist extension:  7/03: -28(72)  5/18: -40(72)  REFERRING DIAG: CVA  THERAPY DIAG:  No diagnosis found.  Rationale for Evaluation and Treatment Rehabilitation  PERTINENT HISTORY: Pt. is a 66 y.o. 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 Pt. has Right sided weakness, and receptive, and expressive aphasia. Pt. has had a previous CVA with right sided weakness in July 2021. PMHx includes: AFib, Renal Artery thrombosis, situational depression, HTN, Pyelnephritis, Seizure activity, COnstipation, AKI. Vitamin D deficiency, and urinary incontinence/  PRECAUTIONS: Fall   SUBJECTIVE: Pt was very motivated to participate and reports no soreness today.    PAIN:  Are you having pain? No     OBJECTIVE:   TODAY'S TREATMENT:   Rationale for Evaluation and Treatment Rehabilitation  OT TREATMENT    Manual Therapy:   Pt. tolerated soft tissue massage to the right scapular musculature. Pt. tolerated scapular mobilizations in elevation, depression, abduction, and rotation following moist heat modality to the right shoulder. Pt. tolerated soft tissue mobilizations for radius on ulna, and metacarpal spread stretches to decrease tightness, and prepare the UE, and hand for ROM and therapeutic Ex.Manual therapy was performed independent of, and in preparation for there. Ex.    Therapeutic Exercise:   Pt. tolerated PROM through all  joint ranges of the RUE, and hand following moist heat modality, and manual therapy. Pt. education was provided about self-ROM to the right MPs, PIPs, and DIPs. Pt. Required hand over hand assist to perform self-ROM.   Pt. Continues to present with flexor tone, and tightness through the RUE, wrist, and digits. Pt. continues to respond well to the session with less tone, tightness, and increased  ROM noted throughout the RUE following ROM, and manual therapy. Pt. was able to tolerate the UBE, however was distracted by the inside chain on the UBE. Pt. presented with increased tightness with right shoulder abduction, and flexion. Pt. continues to work on normalizing tone, and facilitating active volitional movement in the RUE, and hand to improve engagement of the RUE during ADLs, as well as improve transfers, and functional mobility for toileting.         PATIENT EDUCATION: Education details: dressing techniques, HEP Person educated: Patient Education method: Medical illustrator Education comprehension: verbalized understanding, returned demonstration, verbal cues required, and tactile cues required   HOME EXERCISE PROGRAM   Continue with ongoing HEPs for ROM to the RUE, hand, and digits.    OT Short Term Goals -       OT SHORT TERM GOAL #1   Title Pt and caregiver will be independent with HEP for RUE    Baseline Eval: No current program. 10th visit: Caregivers  report being comfortable with exercises at home. 01/06/2022: Continue ongoing HEPs for UE. 30th visit: Pt./caregivers to continue with ROM; 40th: pt/caregivers to continue with PROM throughout RUE   Time 6   Period Weeks    Status On-going    Target Date 05/16/2022             OT Long Term Goals -       OT LONG TERM GOAL #1   Title Pt. will engage the RUE as a gross assist/atabilizer during ADLs 100% of the time    Baseline 04/04/2022: Pt. Is using her right hand to hold the loofa, and stabilize items.30th visit: Pt. is initiating using her RUE as a gross stabilizer with maxA 01/06/2022: Pt. is starting to engage her RUE as a gross stabiliizer with increased cues. Eval: pt. does not currently engage her RUE during ADLs. 10th visit: Pt. continues to need to work towards using her RUE as a gross stabilizer.    Time 12    Status On-going    Target Date 06/27/22      OT LONG TERM GOAL #2   Title Pt. will  perform UE dressing using one armed dressing techniques    Baseline Eval: MaxA, 10th visit: ModA 01/06/2022: min-modA 30th visit; min0-modA, 04/04/2022: minA donning loose shirts, MaxA tighter items/sports bra, independent doffing a shirt and bra    Time 12    Period Weeks    Status On-going    Target Date 06/27/22      OT LONG TERM GOAL #3   Title Pt. will require minA to stand and hike pants    Baseline Eval: MaxA, 10th visit: ModA 01/06/2022: ModA 30th visit: ModA 04/04/2022: independent with elastic pants if above the knees. MaxA if they fall below the knee; 04/07/22: min guard-min A to reach below knees to simulate hiking pants   Time 12    Period Weeks    Status On-going    Target Date 06/27/22      OT LONG TERM GOAL #4   Title Pt. will perform LE dressing with minA  Baseline Eval: MaxA 10th visit: Pt. requires MaxA for donning her brace, shoes, and socks. ModA with pants. 01/06/2022: ModA pants, MaxA shoes, socks, and brace 30th visit: ModA pants, MaxA shoes, socks, and brace. 04/04/2022: MaxA; 04/07/22: pt can don/doff L sock and shoe with supv, dep for RLE; see above for hiking pants   Time 12    Period Weeks    Status On-going    Target Date 06/27/22      OT LONG TERM GOAL #5   Title Pt. will improve FOTO score by 2 pt. to reflect funcational change    Baseline Eval: FOTO score: 12, 10th visit: FOTO score 27 01/06/2022: 27 30th visit TBA at next visit secondary to no family available during the session; 04/04/22: FOTO 40   Time 12    Period Weeks    Status On-going    Target Date 06/27/22      OT LONG TERM GOAL #6   Title Patient will transfer to bedside commode or toilet with supervision.    Baseline 30th visit: min-modA. 01/06/2022: min-ModA 04/04/2022: Min-ModA   Time 4    Period Weeks    Status On-going    Target Date 06/27/22        OT LONG TERM GOAL #7                                                      Title: Pt. Will improve AROM wrist extension by 5 degrees in  preparation for improving functional  reaching.                                                                                  Baseline:       7/03: -28(72)            Time: 4           Period: Weeks           Status: Ongoing           Target Date: 06/27/2022   Plan - 03/24/22 1429     Clinical Impression Statement Pt. Continues to present with flexor tone, and tightness through the RUE, wrist, and digits. Pt. continues to respond well to the session with less tone, tightness, and increased ROM noted throughout the RUE following ROM, and manual therapy. Pt. was able to tolerate the UBE, however was distracted by the inside chain on the UBE. Pt. presented with increased tightness with right shoulder abduction, and flexion. Pt. continues to work on normalizing tone, and facilitating active volitional movement in the RUE, and hand to improve engagement of the RUE during ADLs, as well as improve transfers, and functional mobility for toileting.            OT Occupational Profile and History Detailed Assessment- Review of Records and additional review of physical, cognitive, psychosocial history related to current functional performance    Occupational performance deficits (Please refer to evaluation for details): Leisure;IADL's;ADL's    Body Structure / Function / Physical  Skills ADL;IADL;FMC;ROM;UE functional use;Strength;Decreased knowledge of use of DME;Coordination    Rehab Potential Fair    Clinical Decision Making Several treatment options, min-mod task modification necessary    Comorbidities Affecting Occupational Performance: May have comorbidities impacting occupational performance    Modification or Assistance to Complete Evaluation  Min-Moderate modification of tasks or assist with assess necessary to complete eval    OT Frequency 2x / week    OT Duration 12 weeks    OT Treatment/Interventions Self-care/ADL training;Neuromuscular education;Electrical Stimulation;Patient/family  education;Therapeutic activities;Passive range of motion;Building services engineer;Therapeutic exercise;DME and/or AE instruction;Moist Heat    Consulted and Agree with Plan of Care Patient             Olegario Messier, MS, OTR/L   Olegario Messier, OT 04/14/2022, 12:07 PM

## 2022-04-15 ENCOUNTER — Telehealth: Payer: Self-pay | Admitting: Oncology

## 2022-04-15 LAB — PROTIME-INR: INR: 2 — AB (ref 0.80–1.20)

## 2022-04-15 NOTE — Telephone Encounter (Signed)
Pt daughter [name] called and stated that there was a referral sent in for a hematology appt. I let her know that she was already scheduled for an established appt with Dr. Cathie Hoops and that I would reach out to the team to see if anything appt to should be added or just keep the appt that is already scheduled.

## 2022-04-15 NOTE — Telephone Encounter (Addendum)
An appt was added for 8/1. Please just let her know to keep that appt.

## 2022-04-18 ENCOUNTER — Encounter: Payer: Self-pay | Admitting: Occupational Therapy

## 2022-04-18 ENCOUNTER — Ambulatory Visit: Payer: Medicare Other | Admitting: Physical Therapy

## 2022-04-18 ENCOUNTER — Ambulatory Visit: Payer: Medicare Other | Admitting: Occupational Therapy

## 2022-04-18 ENCOUNTER — Encounter: Payer: Medicare Other | Admitting: Speech Pathology

## 2022-04-18 ENCOUNTER — Encounter: Payer: Self-pay | Admitting: Physical Therapy

## 2022-04-18 DIAGNOSIS — R2681 Unsteadiness on feet: Secondary | ICD-10-CM

## 2022-04-18 DIAGNOSIS — R262 Difficulty in walking, not elsewhere classified: Secondary | ICD-10-CM

## 2022-04-18 DIAGNOSIS — R278 Other lack of coordination: Secondary | ICD-10-CM

## 2022-04-18 DIAGNOSIS — M6281 Muscle weakness (generalized): Secondary | ICD-10-CM

## 2022-04-18 DIAGNOSIS — R269 Unspecified abnormalities of gait and mobility: Secondary | ICD-10-CM

## 2022-04-18 NOTE — Therapy (Signed)
OUTPATIENT PHYSICAL THERAPY TREATMENT NOTE   Patient Name: Lindsey [name] MRN: [id number] DOB:May 29, 1956, 66 y.o., female Today's Date: 04/18/2022  PCP: Dr. 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 ([place]) 09/24/2021   Cerebral embolism with cerebral infarction 09/23/2021   Pyelonephritis 09/19/2021   Pain due to onychomycosis of toenails of both feet 11/19/2020   Normocytic anemia 05/31/2020   Acute ischemic left MCA stroke ([place]) 04/30/2020   Right hemiplegia ([place]) 04/30/2020   Cerebrovascular accident ([place]) 04/21/2020   Atrial fibrillation with RVR ([place]) 04/21/2020   Essential hypertension 04/21/2020    Alcohol abuse 04/21/2020    REFERRING DIAG: s/p CVA  THERAPY DIAG:  Abnormality of gait and mobility  Difficulty in walking, not elsewhere classified  Muscle weakness (generalized)  Unsteadiness on feet  Rationale for Evaluation and Treatment Rehabilitation  PERTINENT HISTORY: Pt was doing very well and was walking short distances wqith a cane prior to second stroke in december. At this point she is limited to wheelchair as her main means of mobility. Pt caregiver reports all detailed information. Pt has hesitation with the left lower extremity and significant weakness on the right lower extremity. Pt will take 2-3 steps when transfering. Pt caregiver assists with all transfers.Pt has shower with ledge to steps over ledge when entering and utilizes bedside commode. Pt has 2 level home with chair lift for upstairs access. Pt would like to improve ransfers, improve neglect on her R side and improve her overall strength and mobility.Pt. is a 65 y.o. 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 Pt. has Right sided weakness, and receptive, and expressive aphasia. Pt. has had a previous CVA with right sided weakness in July 2021. PMHx includes: AFib, Renal Artery thrombosis, situational depression, HTN, Pyelnephritis, Seizure activity, COnstipation, AKI. Vitamin D deficiency, and urinary incontinence.  PRECAUTIONS: fall risk, wearing RLE AFO  SUBJECTIVE: Pt reports doing well. No significant changes since  last session. Pt has some frustration with her R LE function, particularly when challenged.   Lindsey [name]) present at treatment session;   PAIN:  Are you having pain? No     TODAY'S TREATMENT:    INTERVENTIONS:   Pt transferred wheelchair to nustep, stand pivot, min A with min VCs for hand placement and positioning, pt does require increased time but was able to exhibit good R LE control with less A this  session TE   Nustep 2 x 3 min level 1, LE only, nustep R LE brace to keep hip in neutral alignment   Seated   Hamstring curl RTB R side only x 10   Heel slide with slider R  LE x 10, Mod A for end range and cues for rep count   R LE adduction and abduction to PT hand for external cue x 10 ea   Pt required verbal cues for correct exercise technique. She often compensates for LE weakness with increased trunk movement to facilitate increased LE ROM.   Therapeutic activity:   Performed STS from armchair with UE assist via // bar on L side. Cues for slow eccentric control. X 5 repetitions.     -Ambulated in // bars x 3 laps with turns at each end.  Difficulty with turn sequencing. Cues fr keeping LE at appropriate step width with external focus of line in middle of parallel bars.    R LE step up t1/2 foam roller x 10 reps, cues for step length, better ability to get foot on target this session    Pt continues to have difficulty with sequencing of tasks such as sit to stand and turning while standing   Education provided throughout session via VC/TC and demonstration to facilitate movement at target joints and correct muscle activation for all testing and exercises performed.    Pt requested to leave early from appointment due to another engagement so treatment session cut short as a result.    PATIENT EDUCATION: Education details: positioning/exercise Therapist, sports Person educated: Patient Education method: Education officer, environmental, and Verbal cues Education comprehension: verbalized understanding, returned demonstration, and needs further education   HOME EXERCISE PROGRAM: Continue as previously given;    PT Short Term Goals -       PT SHORT TERM GOAL #1   Title Patient will be independent in home exercise program to improve strength/mobility for better functional independence with ADLs.    Baseline no formal HEp for LE/balance at this time; 02/08/2022=Patient  continues to require VC for reminders and assist with hip march/knee ext (seated for strengthening); Reminders to continue to work on static stand at home.    Time 4    Period Weeks    Status Partially Met  04/07/22: complete somewhat regularly     Target Date 12/14/21              PT Long Term Goals -       PT LONG TERM GOAL #1   Title Patient will increase FOTO score to equal to or greater than   41  to demonstrate statistically significant improvement in mobility and quality of life.    Baseline 16 on 2/14; 02/08/2022=Patient unable to complete today- No family present but will attempt again next visit available with family present to assist in completion; 02/17/2022= 40    Time 12    Period Weeks    Status On-going    Target Date 05/03/22      PT  LONG TERM GOAL #3   Title Patient  will complete five times sit to stand test in < 30 seconds with UE assistance indicating an increased LE strength and improved balance.    Baseline 58.47 s with UE assist and with A from PT for R LE positioning; 02/08/2022=54 sec with use of L UE support and Min physical assist to position right LE.  7/6: 30.5 sec, min A for LE positioning, some attempts did not come to full erect posture, did not let go with L UE from armchair with any attempt    Time 12    Period Weeks    Status On-going    Target Date 05/03/22      PT LONG TERM GOAL #4   Title Patient will increase Berg Balance score by > 6 points to demonstrate decreased fall risk during functional activities.    Baseline 12 on 2/14; 02/08/2022= 13/56  04/07/22: 17     Time 12    Period Weeks    Status On-going    Target Date 05/03/22      PT LONG TERM GOAL #5   Title Pt will transfer from seated position on table/chair to/from transport chair/WC in order to indicate improved transfer ability.    Baseline Requires Min A for LE positioning and to prevent LOB. 02/08/2022=Patient attempted to perform on her own- Unable to push herself up from chair-  Very min assist to position right LE into more flexed position and VC for hand placement. Patient able to stand with min Assist with use of gait belt and VC to lean; 02/17/2022=Patient was able to transfer from transport w/c with some physical assist with Right to swing to pivot to side of chair. She was able to sit to stand today with only CGA and then stand pivot with right arm holding onto arm of chair. Only difficulty was stand to sit- Max VC not to plop into chair. 04/07/22: Pt performed movement of B LE to side of chair independently with chair transfer. Pt required min A with R LE foot placement of floor. Pt requires increased time to complete but complete with CGA only for safety and no cues or A to prevent falling/ plopping into armchair.     Time 12    Period Weeks    Status On-going    Target Date 05/03/22      PT LONG TERM GOAL #6   Title Patient will ambulate > 150 feet with CGA using Hemiwalker on level surfaces for improved household and short community distances.    Baseline 02/08/2022= 40 feet with use of HW, heavy CGA with max VC for gait sequencing and w/c follow. 5/18 = 85 feet with use of HW, close CGA  7/6: 45 feet, heavy use of hemiwalker    Time 12    Period Weeks    Status On-going    Target Date 05/03/22      PT LONG TERM GOAL #7   Title Pt will increase 10MWT by at least 0.13 m/s in order to demonstrate clinically significant improvement in community ambulation.    Baseline 02/08/2022= 0.05 m/s using HW 7/6: .047ms with heavy ue use of hemiwalker and CGA for balance    Time 12    Period Weeks    Status Ongoing     Target Date 05/03/22             Plan -     Clinical Impression Statement Continued with current plan of care  as laid out in evaluation and recent prior sessions. Pt remains motivated to advance progress toward goals in order to maximize independence and safety at home. Pt requires high level assistance and cuing for completion of exercises in order to  provide adequate level of stimulation and perturbation.  Pt continues to have difficulty with transfer movements and functional activities like sit to and from stand safely and in controlled fashion.Pt has some non-verbalized frustration when challenged with certain tasks but is not able to communicate these frustration effectively, resulting in perpetuated frustration.  Pt still having increased difficulty with turns in // bars and with transfers, pt does get frustrated with frequent cues.  Pt will continue to benefit from skilled physical therapy intervention to address impairments, improve QOL, and attain therapy goals.       Personal Factors and Comorbidities Age;Comorbidity 1;Comorbidity 2;Comorbidity 3+;Time since onset of injury/illness/exacerbation;Other    Comorbidities AFib, Renal Artery thrombosis, situational depression, HTN, Pyelnephritis, Seizure activity, COnstipation, AKI    Examination-Activity Limitations Bathing;Bed Mobility;Carry;Locomotion Level;Dressing;Sit;Squat;Stairs;Stand;Toileting;Transfers    Examination-Participation Restrictions Church;Community Activity;Laundry    Stability/Clinical Decision Making Evolving/Moderate complexity    Rehab Potential Fair    PT Frequency 2x / week    PT Duration 12 weeks    PT Treatment/Interventions ADLs/Self Care Home Management;Gait training;Stair training;Therapeutic activities;Therapeutic exercise;Balance training;Neuromuscular re-education;Patient/family education;Wheelchair mobility training;Manual techniques;Energy conservation;Dry needling;Passive range of motion;Taping;Spinal Manipulations;Joint Manipulations    PT Next Visit Plan Gait training, Transfer training, LE strengthening as appropriate.    PT Home Exercise Plan HEP: general LE and postural exercises    Consulted and Agree with Plan of Care Patient               Particia Lather, PT, DPT 04/18/2022, 11:17 AM

## 2022-04-18 NOTE — Therapy (Addendum)
OUTPATIENT OCCUPATIONAL THERAPY TREATMENT NOTE   Patient Name: Lindsey [name] MRN: [id number] DOB:[birth year], 66 y.o., female Today's Date: 04/18/2022   REFERRING PROVIDER: Karie Fetch, MD   OT End of Session     Visit Number 43   Number of Visits 48    Date for OT Re-Evaluation 06/27/22    Authorization Type Progress report period starting 12/02/2021    OT Start Time 1145   OT Stop Time 1230   OT Time Calculation (min) 45 min    Activity Tolerance Patient tolerated treatment well    Behavior During Therapy WFL for tasks assessed/performed             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 No pertinent surgical history. 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) 09/24/2021   Cerebral embolism with cerebral infarction 09/23/2021   Pyelonephritis 09/19/2021   Pain due to onychomycosis of toenails of both feet 11/19/2020   Normocytic anemia 05/31/2020   Acute ischemic left MCA stroke (HCC) 04/30/2020   Right hemiplegia (HCC) 04/30/2020   Cerebrovascular accident (HCC) 04/21/2020   Atrial fibrillation with RVR (HCC) 04/21/2020   Essential hypertension 04/21/2020   Alcohol abuse 04/21/2020    RUE Measurements (Taken 04/04/22):   PROM Shoulder flexion:  7/03: 0(108)  5/18:  0(105)  PROM Shoulder abduction: 7/03: 6(759)  5/18: 0(102)  PROM Elbow:   7/03: 0(0-140)  5/18: 0(0-136)  AROM Wrist extension:  7/03: -16(38)  5/18: -40(72)  REFERRING DIAG: CVA  THERAPY DIAG:  Muscle weakness (generalized)  Other lack of coordination  Rationale for Evaluation and Treatment Rehabilitation  PERTINENT HISTORY: Pt. is a 66 y.o. 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 Pt. has Right sided weakness, and receptive, and expressive aphasia. Pt. has had a previous CVA with right sided weakness in July 2021. PMHx includes: AFib, Renal Artery thrombosis, situational depression, HTN, Pyelnephritis, Seizure activity, COnstipation, AKI. Vitamin D deficiency, and urinary incontinence/  PRECAUTIONS: Fall   SUBJECTIVE: Pt. Arrived  to OT tearful following PT. Pt.'s grandson reports that she was frustrated with how she did  in PT today.    PAIN:  Are you having pain? No     OBJECTIVE:   TODAY'S TREATMENT:   Rationale for Evaluation and Treatment Rehabilitation  OT TREATMENT    Manual Therapy:   Pt. tolerated soft tissue massage to the right scapular musculature. Pt. tolerated scapular mobilizations in elevation, depression, abduction, and rotation following moist heat modality to the right shoulder. Pt. tolerated soft tissue mobilizations for radius on ulna, and metacarpal spread stretches to decrease tightness, and prepare the UE, and hand for ROM and therapeutic Ex.Manual therapy was performed independent of, and  in preparation for there. Ex.    Therapeutic Exercise:   Pt. tolerated PROM through all joint ranges of the RUE, and hand following moist heat modality, and manual therapy. Pt. education was provided about self-ROM to the right MPs, PIPs, and DIPs. Pt. Required hand over hand assist to perform self-ROM.   Pt. Education was provided to the Pt., and her grandson, Thurston Pounds. Pt.  continues to present with flexor tone, and tightness through the RUE, wrist, and digits. Pt. continues to respond well to the session with less tone, tightness, and increased ROM noted throughout the RUE following ROM, and manual therapy. Pt. presented with increased tightness with right shoulder abduction, and flexion. Pt. continues to work on normalizing tone, and facilitating active volitional movement in the RUE, and hand to improve engagement of the RUE during ADLs, as well as improve transfers, and functional mobility for toileting.         PATIENT EDUCATION: Education details: dressing techniques, HEP Person educated: Patient Education method: Medical illustrator Education comprehension: verbalized understanding, returned demonstration, verbal cues required, and tactile cues required   HOME EXERCISE PROGRAM   Continue with ongoing HEPs for ROM to the RUE, hand, and digits.    OT Short Term Goals -       OT SHORT TERM GOAL #1   Title Pt and caregiver will be independent with HEP for RUE    Baseline Eval: No current program. 10th visit: Caregivers  report being comfortable with exercises at home. 01/06/2022: Continue ongoing HEPs for UE. 30th visit: Pt./caregivers to continue with ROM; 40th: pt/caregivers to continue with PROM throughout RUE   Time 6   Period Weeks    Status On-going    Target Date 05/16/2022             OT Long Term Goals -       OT LONG TERM GOAL #1   Title Pt. will engage the RUE as a gross assist/atabilizer during ADLs 100% of the time    Baseline 04/04/2022: Pt. Is using her right hand to hold the loofa, and stabilize items.30th visit: Pt. is initiating using her RUE as a gross stabilizer with maxA 01/06/2022: Pt. is starting to engage her RUE as a gross stabiliizer with increased cues. Eval: pt. does not currently engage her RUE during ADLs. 10th visit: Pt. continues to need to work towards using her RUE as a gross stabilizer.    Time 12     Status On-going    Target Date 06/27/22      OT LONG TERM GOAL #2   Title Pt. will perform UE dressing using one armed dressing techniques    Baseline Eval: MaxA, 10th visit: ModA 01/06/2022: min-modA 30th visit; min0-modA, 04/04/2022: minA donning loose shirts, MaxA tighter items/sports bra, independent doffing a shirt and bra    Time 12    Period Weeks    Status On-going    Target Date 06/27/22      OT LONG TERM GOAL #3   Title Pt. will require minA to stand and hike pants    Baseline Eval: MaxA, 10th visit: ModA 01/06/2022: ModA 30th visit: ModA 04/04/2022: independent with elastic pants if above the knees. MaxA if they fall below the knee; 04/07/22: min guard-min A to reach below knees to simulate hiking pants   Time 12    Period Weeks    Status On-going    Target Date 06/27/22      OT LONG TERM GOAL #4  Title Pt. will perform LE dressing with minA    Baseline Eval: MaxA 10th visit: Pt. requires MaxA for donning her brace, shoes, and socks. ModA with pants. 01/06/2022: ModA pants, MaxA shoes, socks, and brace 30th visit: ModA pants, MaxA shoes, socks, and brace. 04/04/2022: MaxA; 04/07/22: pt can don/doff L sock and shoe with supv, dep for RLE; see above for hiking pants   Time 12    Period Weeks    Status On-going    Target Date 06/27/22      OT LONG TERM GOAL #5   Title Pt. will improve FOTO score by 2 pt. to reflect funcational change    Baseline Eval: FOTO score: 12, 10th visit: FOTO score 27 01/06/2022: 27 30th visit TBA at next visit secondary to no family available during the session; 04/04/22: FOTO 40   Time 12    Period Weeks    Status On-going    Target Date 06/27/22      OT LONG TERM GOAL #6   Title Patient will transfer to bedside commode or toilet with supervision.    Baseline 30th visit: min-modA. 01/06/2022: min-ModA 04/04/2022: Min-ModA   Time 4    Period Weeks    Status On-going    Target Date 06/27/22        OT LONG TERM GOAL #7                                                       Title: Pt. Will improve AROM wrist extension by 5 degrees in preparation for improving functional  reaching.                                                                                  Baseline:       7/03: -28(72)            Time: 4           Period: Weeks           Status: Ongoing           Target Date: 06/27/2022   Plan - 03/24/22 1429     Clinical Impression Statement Pt. Education was provided to the Pt., and her grandson, Thurston Pounds. Pt. continues to present with flexor tone, and tightness through the RUE, wrist, and digits. Pt. continues to respond well to the session with less tone, tightness, and increased ROM noted throughout the RUE following ROM, and manual therapy. Pt. presented with increased tightness with right shoulder abduction, and flexion. Pt. continues to work on normalizing tone, and facilitating active volitional movement in the RUE, and hand to improve engagement of the RUE during ADLs, as well as improve transfers, and functional mobility for toileting.    .                OT Occupational Profile and History Detailed Assessment- Review of Records and additional review of physical, cognitive, psychosocial history related to current functional performance    Occupational performance deficits (Please refer to  evaluation for details): Leisure;IADL's;ADL's    Body Structure / Function / Physical Skills ADL;IADL;FMC;ROM;UE functional use;Strength;Decreased knowledge of use of DME;Coordination    Rehab Potential Fair    Clinical Decision Making Several treatment options, min-mod task modification necessary    Comorbidities Affecting Occupational Performance: May have comorbidities impacting occupational performance    Modification or Assistance to Complete Evaluation  Min-Moderate modification of tasks or assist with assess necessary to complete eval    OT Frequency 2x / week    OT Duration 12 weeks    OT Treatment/Interventions Self-care/ADL  training;Neuromuscular education;Electrical Stimulation;Patient/family education;Therapeutic activities;Passive range of motion;Building services engineer;Therapeutic exercise;DME and/or AE instruction;Moist Heat    Consulted and Agree with Plan of Care Patient             Olegario Messier, MS, OTR/L   Olegario Messier, OT 04/18/2022, 4:42 PM

## 2022-04-21 ENCOUNTER — Ambulatory Visit: Payer: Medicare Other

## 2022-04-21 ENCOUNTER — Encounter: Payer: Medicare Other | Admitting: Speech Pathology

## 2022-04-21 ENCOUNTER — Ambulatory Visit: Payer: Medicare Other | Admitting: Occupational Therapy

## 2022-04-21 DIAGNOSIS — R2681 Unsteadiness on feet: Secondary | ICD-10-CM

## 2022-04-21 DIAGNOSIS — R262 Difficulty in walking, not elsewhere classified: Secondary | ICD-10-CM

## 2022-04-21 DIAGNOSIS — I63542 Cerebral infarction due to unspecified occlusion or stenosis of left cerebellar artery: Secondary | ICD-10-CM

## 2022-04-21 DIAGNOSIS — M6281 Muscle weakness (generalized): Secondary | ICD-10-CM | POA: Diagnosis not present

## 2022-04-21 DIAGNOSIS — R269 Unspecified abnormalities of gait and mobility: Secondary | ICD-10-CM

## 2022-04-21 DIAGNOSIS — R278 Other lack of coordination: Secondary | ICD-10-CM

## 2022-04-21 NOTE — Therapy (Signed)
OUTPATIENT OCCUPATIONAL THERAPY TREATMENT NOTE   Patient Name: Lindsey [name] MRN: [id number] DOB:[birth year], 66 y.o., female Today's Date: 04/21/2022   REFERRING PROVIDER: Tomasa Hose, MD   OT End of Session - 04/21/22 1403     Visit Number 56    Number of Visits 22    Date for OT Re-Evaluation 06/27/22    Authorization Type Progress report period starting 02/17/22    OT Start Time 1103    OT Stop Time 1145    OT Time Calculation (min) 42 min    Equipment Utilized During Treatment transport chair    Activity Tolerance Patient tolerated treatment well    Behavior During Therapy WFL for tasks assessed/performed               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 ([place]) 09/24/2021   Cerebral embolism with cerebral infarction 09/23/2021   Pyelonephritis 09/19/2021   Pain due to onychomycosis of toenails of both feet 11/19/2020   Normocytic anemia 05/31/2020   Acute ischemic left MCA stroke ([place]) 04/30/2020   Right hemiplegia ([place]) 04/30/2020   Cerebrovascular accident ([place]) 04/21/2020   Atrial fibrillation with RVR ([place]) 04/21/2020   Essential hypertension 04/21/2020   Alcohol abuse 04/21/2020    RUE Measurements (Taken  04/04/22):   PROM Shoulder flexion:  7/03: 0(108)  5/18: 0(105)  PROM Shoulder abduction: 7/03: [id number]  5/18: 0(102)  PROM Elbow:   7/03: 0(0-140)  5/18: 0(0-136)  AROM Wrist extension:  7/03: [id number]  5/18: -40(72)  REFERRING DIAG: CVA  THERAPY DIAG:  Muscle weakness (generalized)  Rationale for Evaluation and Treatment Rehabilitation  PERTINENT HISTORY: Pt. is a 66 y.o. 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 Pt. has Right sided weakness, and receptive, and expressive aphasia. Pt. has had a previous CVA with right sided weakness in July 2021. PMHx includes: AFib, Renal Artery thrombosis, situational depression, HTN, Pyelnephritis, Seizure activity, COnstipation, AKI. Vitamin D deficiency, and urinary incontinence/  PRECAUTIONS: Fall   SUBJECTIVE: Pt. Arrived  to OT tearful following PT. Pt.'s grandson reports that she was frustrated with how she did  in PT today.    PAIN:  Are you having pain? No     OBJECTIVE:   TODAY'S TREATMENT:   Rationale for Evaluation and Treatment Rehabilitation  OT TREATMENT    Manual Therapy:   Pt. tolerated soft tissue massage to the right scapular musculature. Pt. tolerated scapular mobilizations in elevation, depression, abduction, and rotation following moist heat modality to the right shoulder. Pt. tolerated soft tissue mobilizations for radius on ulna, and metacarpal spread stretches to decrease tightness, and prepare the UE, and  hand for ROM and therapeutic Ex.Manual therapy was performed independent of, and in preparation for there. Ex.    Therapeutic Exercise:   Pt. tolerated PROM through all joint ranges of the RUE, and hand following moist heat modality, and manual therapy. Pt. education was provided about self-ROM to the right MPs, PIPs, and DIPs. Pt. Required hand over hand assist to perform self-ROM.   Pt. continues to present with flexor tone, and  tightness through the RUE, wrist, and digits, however was able able to achieve increased PROM in the right shoulder, and active ROM for reps of wrist extension. Pt. continues to respond well to the session with less tone, tightness, and increased ROM noted throughout the RUE following ROM, and manual therapy. Pt. presented with increased tightness with right shoulder abduction, and flexion. Pt. continues to work on normalizing tone, and facilitating active volitional movement in the RUE, and hand to improve engagement of the RUE during ADLs, as well as improve transfers, and functional mobility for toileting.         PATIENT EDUCATION: Education details: dressing techniques, HEP Person educated: Patient Education method: Medical illustrator Education comprehension: verbalized understanding, returned demonstration, verbal cues required, and tactile cues required   HOME EXERCISE PROGRAM   Continue with ongoing HEPs for ROM to the RUE, hand, and digits.    OT Short Term Goals -       OT SHORT TERM GOAL #1   Title Pt and caregiver will be independent with HEP for RUE    Baseline Eval: No current program. 10th visit: Caregivers  report being comfortable with exercises at home. 01/06/2022: Continue ongoing HEPs for UE. 30th visit: Pt./caregivers to continue with ROM; 40th: pt/caregivers to continue with PROM throughout RUE   Time 6   Period Weeks    Status On-going    Target Date 05/16/2022             OT Long Term Goals -       OT LONG TERM GOAL #1   Title Pt. will engage the RUE as a gross assist/atabilizer during ADLs 100% of the time    Baseline 04/04/2022: Pt. Is using her right hand to hold the loofa, and stabilize items.30th visit: Pt. is initiating using her RUE as a gross stabilizer with maxA 01/06/2022: Pt. is starting to engage her RUE as a gross stabiliizer with increased cues. Eval: pt. does not currently engage her RUE during ADLs. 10th visit: Pt. continues to need  to work towards using her RUE as a gross stabilizer.    Time 12    Status On-going    Target Date 06/27/22      OT LONG TERM GOAL #2   Title Pt. will perform UE dressing using one armed dressing techniques    Baseline Eval: MaxA, 10th visit: ModA 01/06/2022: min-modA 30th visit; min0-modA, 04/04/2022: minA donning loose shirts, MaxA tighter items/sports bra, independent doffing a shirt and bra    Time 12    Period Weeks    Status On-going    Target Date 06/27/22      OT LONG TERM GOAL #3   Title Pt. will require minA to stand and hike pants    Baseline Eval: MaxA, 10th visit: ModA 01/06/2022: ModA 30th visit: ModA 04/04/2022: independent with elastic pants if above the knees. MaxA if they fall below the knee; 04/07/22: min guard-min A to reach below knees to simulate hiking pants   Time 12    Period Weeks  Status On-going    Target Date 06/27/22      OT LONG TERM GOAL #4   Title Pt. will perform LE dressing with minA    Baseline Eval: MaxA 10th visit: Pt. requires MaxA for donning her brace, shoes, and socks. ModA with pants. 01/06/2022: ModA pants, MaxA shoes, socks, and brace 30th visit: ModA pants, MaxA shoes, socks, and brace. 04/04/2022: MaxA; 04/07/22: pt can don/doff L sock and shoe with supv, dep for RLE; see above for hiking pants   Time 12    Period Weeks    Status On-going    Target Date 06/27/22      OT LONG TERM GOAL #5   Title Pt. will improve FOTO score by 2 pt. to reflect funcational change    Baseline Eval: FOTO score: 12, 10th visit: FOTO score 27 01/06/2022: 27 30th visit TBA at next visit secondary to no family available during the session; 04/04/22: FOTO 40   Time 12    Period Weeks    Status On-going    Target Date 06/27/22      OT LONG TERM GOAL #6   Title Patient will transfer to bedside commode or toilet with supervision.    Baseline 30th visit: min-modA. 01/06/2022: min-ModA 04/04/2022: Min-ModA   Time 4    Period Weeks    Status On-going    Target Date 06/27/22         OT LONG TERM GOAL #7                                                      Title: Pt. Will improve AROM wrist extension by 5 degrees in preparation for improving functional  reaching.                                                                                  Baseline:       7/03: -28(72)            Time: 4           Period: Weeks           Status: Ongoing           Target Date: 06/27/2022   Plan - 03/24/22 1429     Clinical Impression Statement Pt. Education was provided to the Pt., and her grandson, Lytle Michaels. Pt. continues to present with flexor tone, and tightness through the RUE, wrist, and digits. Pt. continues to respond well to the session with less tone, tightness, and increased ROM noted throughout the RUE following ROM, and manual therapy. Pt. presented with increased tightness with right shoulder abduction, and flexion. Pt. continues to work on normalizing tone, and facilitating active volitional movement in the RUE, and hand to improve engagement of the RUE during ADLs, as well as improve transfers, and functional mobility for toileting.    .                OT Occupational Profile and History Detailed Assessment- Review of Records and additional  review of physical, cognitive, psychosocial history related to current functional performance    Occupational performance deficits (Please refer to evaluation for details): Leisure;IADL's;ADL's    Body Structure / Function / Physical Skills ADL;IADL;FMC;ROM;UE functional use;Strength;Decreased knowledge of use of DME;Coordination    Rehab Potential Fair    Clinical Decision Making Several treatment options, min-mod task modification necessary    Comorbidities Affecting Occupational Performance: May have comorbidities impacting occupational performance    Modification or Assistance to Complete Evaluation  Min-Moderate modification of tasks or assist with assess necessary to complete eval    OT Frequency 2x / week    OT Duration 12  weeks    OT Treatment/Interventions Self-care/ADL training;Neuromuscular education;Electrical Stimulation;Patient/family education;Therapeutic activities;Passive range of motion;Building services engineer;Therapeutic exercise;DME and/or AE instruction;Moist Heat    Consulted and Agree with Plan of Care Patient             Olegario Messier, MS, OTR/L   Olegario Messier, OT 04/21/2022, 2:06 PM

## 2022-04-21 NOTE — Therapy (Signed)
OUTPATIENT PHYSICAL THERAPY TREATMENT NOTE   Patient Name: Lindsey [name] MRN: [id number] DOB:31-Dec-1955, 66 y.o., female Today's Date: 04/21/2022  PCP: Dr. Tomasa Hose MD REFERRING PROVIDER: Leeroy Cha MD   PT End of Session - 04/21/22 1200     Visit Number 34    Number of Visits 41    Date for PT Re-Evaluation 05/03/22    Authorization Type Medicare    Authorization Time Period Cert through 05/09/56; Recert 06/10/2819-6/0/1561    Progress Note Due on Visit 40    PT Start Time 1149    PT Stop Time 1229    PT Time Calculation (min) 40 min    Equipment Utilized During Treatment Gait belt    Activity Tolerance Patient limited by fatigue;No increased pain    Behavior During Therapy WFL for tasks assessed/performed               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 ([place]) 09/24/2021   Cerebral embolism with cerebral infarction 09/23/2021   Pyelonephritis 09/19/2021   Pain due to onychomycosis of toenails of both feet 11/19/2020   Normocytic anemia 05/31/2020   Acute ischemic left MCA stroke ([place]) 04/30/2020   Right hemiplegia ([place]) 04/30/2020   Cerebrovascular accident ([place]) 04/21/2020   Atrial fibrillation with RVR ([place])  04/21/2020   Essential hypertension 04/21/2020   Alcohol abuse 04/21/2020    REFERRING DIAG: s/p CVA  THERAPY DIAG:  Abnormality of gait and mobility  Difficulty in walking, not elsewhere classified  Muscle weakness (generalized)  Unsteadiness on feet  Other lack of coordination  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 At this point she is limited to wheelchair as her main means of mobility. Pt caregiver reports all detailed information. Pt has hesitation with the left lower extremity and significant weakness on the right lower extremity. Pt will take 2-3 steps when transfering. Pt caregiver assists with all transfers.Pt has shower with ledge to steps over ledge when entering and utilizes bedside commode. Pt has 2 level home with chair lift for upstairs access. Pt would like to improve ransfers, improve neglect on her R side and improve her overall strength and mobility.Pt. is a 66 y.o. 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 Pt. has Right sided weakness, and receptive, and expressive aphasia. Pt. has had a previous CVA with right sided weakness in July 2021. PMHx includes: AFib, Renal Artery thrombosis, situational depression, HTN, Pyelnephritis,  Seizure activity, COnstipation, AKI. Vitamin D deficiency, and urinary incontinence.  PRECAUTIONS: fall risk, wearing RLE AFO  SUBJECTIVE: Pt reports doing well. No significant changes since last session. No pain today, no medication updates.    PAIN:  Are you having pain? No     TODAY'S TREATMENT:    INTERVENTIONS:   Pt transferred wheelchair to nustep, stand pivot, min A with min VCs for hand placement and positioning, pt does  require increased time but was able to exhibit good R LE control with less A this session Nustep x6 minutes, level 1, LUE only and legs, nustep R LE brace to keep hip in neutral alignment;   -STS training 1x5, supervision to minA for poor consistency in balance acquisition   (Rt AFO, Left HW, airex booster on WC seat) -RLE Hamstring curl RTB + LAQ AA/ROM 1x15 each  -seated LUE reach to floor x5 to practice forward lean needed to STS transfers  -STS training 1x5, supervision to minA for poor consistency in balance acquisition  -AMB overground c LUE HW, Rt AFO, 3-point gait, 29fet (poor balance capacity, HW too laterally placed, inadequately anterior, frequently steps [facility] creates a retropulsive LOB)    PATIENT EDUCATION: Education details: positioning/exercise tTherapist, sportsPerson educated: Patient Education method: EConsulting civil engineer Demonstration, and Verbal cues Education comprehension: verbalized understanding, returned demonstration, and needs further education   HOME EXERCISE PROGRAM: Continue as previously given;    PT Short Term Goals -       PT SHORT TERM GOAL #1   Title Patient will be independent in home exercise program to improve strength/mobility for better functional independence with ADLs.    Baseline no formal HEp for LE/balance at this time; 02/08/2022=Patient continues to require VC for reminders and assist with hip march/knee ext (seated for strengthening); Reminders to continue to work on static stand at home.    Time 4    Period Weeks    Status Partially Met  04/07/22: complete somewhat regularly     Target Date 12/14/21              PT Long Term Goals -       PT LONG TERM GOAL #1   Title Patient will increase FOTO score to equal to or greater than   41  to demonstrate statistically significant improvement in mobility and quality of life.    Baseline 16 on 2/14; 02/08/2022=Patient unable to complete today- No family present but will attempt  again next visit available with family present to assist in completion; 02/17/2022= 40    Time 12    Period Weeks    Status On-going    Target Date 05/03/22      PT LONG TERM GOAL #3   Title Patient  will complete five times sit to stand test in < 30 seconds with UE assistance indicating an increased LE strength and improved balance.    Baseline 58.47 s with UE assist and with A from PT for R LE positioning; 02/08/2022=54 sec with use of L UE support and Min physical assist to position right LE.  7/6: 30.5 sec, min A for LE positioning, some attempts did not come to full erect posture, did not let go with L UE from armchair with any attempt    Time 12    Period Weeks    Status On-going    Target Date 05/03/22      PT LONG TERM GOAL #4   Title Patient will increase [employer]  score by > 6 points to demonstrate decreased fall risk during functional activities.    Baseline 12 on 2/14; 02/08/2022= 13/56  04/07/22: 17     Time 12    Period Weeks    Status On-going    Target Date 05/03/22      PT LONG TERM GOAL #5   Title Pt will transfer from seated position on table/chair to/from transport chair/WC in order to indicate improved transfer ability.    Baseline Requires Min A for LE positioning and to prevent LOB. 02/08/2022=Patient attempted to perform on her own- Unable to push herself up from chair- Very min assist to position right LE into more flexed position and VC for hand placement. Patient able to stand with min Assist with use of gait belt and VC to lean; 02/17/2022=Patient was able to transfer from transport w/c with some physical assist with Right to swing to pivot to side of chair. She was able to sit to stand today with only CGA and then stand pivot with right arm holding onto arm of chair. Only difficulty was stand to sit- Max VC not to plop into chair. 04/07/22: Pt performed movement of B LE to side of chair independently with chair transfer. Pt required min A with R LE foot placement of  floor. Pt requires increased time to complete but complete with CGA only for safety and no cues or A to prevent falling/ plopping into armchair.     Time 12    Period Weeks    Status On-going    Target Date 05/03/22      PT LONG TERM GOAL #6   Title Patient will ambulate > 150 feet with CGA using Hemiwalker on level surfaces for improved household and short community distances.    Baseline 02/08/2022= 40 feet with use of HW, heavy CGA with max VC for gait sequencing and w/c follow. 5/18 = 85 feet with use of HW, close CGA  7/6: 45 feet, heavy use of hemiwalker    Time 12    Period Weeks    Status On-going    Target Date 05/03/22      PT LONG TERM GOAL #7   Title Pt will increase 10MWT by at least 0.13 m/s in order to demonstrate clinically significant improvement in community ambulation.    Baseline 02/08/2022= 0.05 m/s using HW 7/6: .060ms with heavy ue use of hemiwalker and CGA for balance    Time 12    Period Weeks    Status Ongoing     Target Date 05/03/22             Plan -     Clinical Impression Statement Continued with current plan of care as laid out in evaluation and recent prior sessions. Pt remains motivated to advance progress toward goals in order to maximize independence and safety at home. Pt requires high level assistance and cuing for completion of mobility training today due to somewhat more limited strength and steadiness today. Pt continues to have difficulty with transfer movements and functional activities like sit to and from stand safely and in controlled fashion. Several significant LOB in session requiring maxA for recovery today. Heavy emphasis and practice with improving her anterolateral weight shift in transfers and standing. Pt will continue to benefit from skilled physical therapy intervention to address impairments, improve QOL, and attain therapy goals.     Personal Factors and Comorbidities Age;Comorbidity 1;Comorbidity 2;Comorbidity 3+;Time since  onset of injury/illness/exacerbation;Other    Comorbidities AFib,  Renal Artery thrombosis, situational depression, HTN, Pyelnephritis, Seizure activity, COnstipation, AKI    Examination-Activity Limitations Bathing;Bed Mobility;Carry;Locomotion Level;Dressing;Sit;Squat;Stairs;Stand;Toileting;Transfers    Examination-Participation Restrictions Church;Community Activity;Laundry    Stability/Clinical Decision Making Evolving/Moderate complexity    Rehab Potential Fair    PT Frequency 2x / week    PT Duration 12 weeks    PT Treatment/Interventions ADLs/Self Care Home Management;Gait training;Stair training;Therapeutic activities;Therapeutic exercise;Balance training;Neuromuscular re-education;Patient/family education;Wheelchair mobility training;Manual techniques;Energy conservation;Dry needling;Passive range of motion;Taping;Spinal Manipulations;Joint Manipulations    PT Next Visit Plan Gait training, Transfer training, LE strengthening as appropriate.    PT Home Exercise Plan HEP: general LE and postural exercises    Consulted and Agree with Plan of Care Patient             1:09 PM, 04/21/22 Etta Grandchild, PT, DPT Physical Therapist - [facility]  Outpatient Physical Therapy- [place] [phone]      [place] C, PT, DPT 04/21/2022, 1:09 PM

## 2022-04-25 ENCOUNTER — Encounter: Payer: Self-pay | Admitting: Physical Therapy

## 2022-04-25 ENCOUNTER — Encounter: Payer: Medicare Other | Admitting: Speech Pathology

## 2022-04-25 ENCOUNTER — Ambulatory Visit: Payer: Medicare Other | Admitting: Physical Medicine and Rehabilitation

## 2022-04-25 ENCOUNTER — Ambulatory Visit: Payer: Medicare Other | Admitting: Physical Therapy

## 2022-04-25 ENCOUNTER — Ambulatory Visit: Payer: Medicare Other

## 2022-04-25 DIAGNOSIS — R269 Unspecified abnormalities of gait and mobility: Secondary | ICD-10-CM

## 2022-04-25 DIAGNOSIS — M6281 Muscle weakness (generalized): Secondary | ICD-10-CM

## 2022-04-25 DIAGNOSIS — R2681 Unsteadiness on feet: Secondary | ICD-10-CM

## 2022-04-25 DIAGNOSIS — I63542 Cerebral infarction due to unspecified occlusion or stenosis of left cerebellar artery: Secondary | ICD-10-CM

## 2022-04-25 DIAGNOSIS — R262 Difficulty in walking, not elsewhere classified: Secondary | ICD-10-CM

## 2022-04-25 DIAGNOSIS — R278 Other lack of coordination: Secondary | ICD-10-CM

## 2022-04-25 NOTE — Therapy (Signed)
OUTPATIENT OCCUPATIONAL THERAPY TREATMENT NOTE   Patient Name: Lindsey [name] MRN: [id number] DOB:[birth year], 66 y.o., female Today's Date: 04/25/2022   REFERRING PROVIDER: Karie Fetch, MD   OT End of Session - 04/25/22 1428     Visit Number 45    Number of Visits 72    Date for OT Re-Evaluation 06/27/22    Authorization Type Progress report period starting 02/17/22    OT Start Time 1300    OT Stop Time 1345    OT Time Calculation (min) 45 min    Equipment Utilized During Treatment transport chair    Activity Tolerance Patient tolerated treatment well    Behavior During Therapy WFL for tasks assessed/performed               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) 09/24/2021   Cerebral embolism with cerebral infarction 09/23/2021   Pyelonephritis 09/19/2021   Pain due to onychomycosis of toenails of both feet 11/19/2020   Normocytic anemia 05/31/2020   Acute ischemic left MCA stroke (HCC) 04/30/2020   Right hemiplegia (HCC) 04/30/2020   Cerebrovascular accident (HCC) 04/21/2020   Atrial fibrillation with RVR (HCC) 04/21/2020   Essential hypertension 04/21/2020   Alcohol abuse 04/21/2020    RUE Measurements (Taken  04/04/22):   PROM Shoulder flexion:  7/03: 0(108)  5/18: 0(105)  PROM Shoulder abduction: 7/03: 8(416)  5/18: 0(102)  PROM Elbow:   7/03: 0(0-140)  5/18: 0(0-136)  AROM Wrist extension:  7/03: -60(63)  5/18: -40(72)  REFERRING DIAG: CVA  THERAPY DIAG:  Muscle weakness (generalized)  Other lack of coordination  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 is a 66 y.o. 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 Pt. has Right sided weakness, and receptive, and expressive aphasia. Pt. has had a previous CVA with right sided weakness in July 2021. PMHx includes: AFib, Renal Artery thrombosis, situational depression, HTN, Pyelnephritis, Seizure activity, COnstipation, AKI. Vitamin D deficiency, and urinary incontinence/  PRECAUTIONS: Fall   SUBJECTIVE: Pt arrived with daughter.  Daughter reports pt has been walking more at home.  OT communicated this gain to PT and praised pt for her improvement.   PAIN:  Are you having pain? No   OBJECTIVE:   TODAY'S TREATMENT:   Rationale for Evaluation and Treatment Rehabilitation  OT TREATMENT     Therapeutic Exercise: Pt. tolerated PROM through all joint ranges of the RUE in supine position.  Pt returned demo of forearm pron and shoulder ER with elbow at side.  In supine,  performed 1# dowel exercises to facilitate AAROM with BUEs, OT providing constant support to the RUE to guide through smooth ROM arcs and facilitate tone reduction at elbow and forearm.  Pt performed chest press, shoulder flex 0-90, horiz abd/add, and elbow flex/ext x10 each.  Self Care: SPT transport chair<>mat table with mod A this date.  Tactile, visual, and verbal cues for hand/foot placement in prep for transfer.  In sitting, pt practiced use of R hand as a  stabilizer to open medication bottles with initial max vc and demo.  OT guided pt to place bottles in R hand and use L hand to twist caps.  Pt required min A for push and turn bottles, but was able to manage easy twist off bottles with supv and extra time.  Made attempts at volitional grasping with hand around pill bottle while L hand twisted cap, but pt was unable to manage these combination movements, and lacked tight enough squeeze to keep pill bottle from turning in hand.     PATIENT EDUCATION: Education details: HEP, using RUE as a stabilizer during ADLs Person educated: Patient Education method: Explanation and Demonstration Education comprehension: verbalized understanding, returned demonstration, verbal cues required, and tactile cues required   HOME EXERCISE PROGRAM   Continue with ongoing HEPs for ROM to the RUE, hand, and digits.    OT Short Term Goals -       OT SHORT TERM GOAL #1   Title Pt and caregiver will be independent with HEP for RUE    Baseline Eval: No current program. 10th visit: Caregivers  report being comfortable with exercises at home. 01/06/2022: Continue ongoing HEPs for UE. 30th visit: Pt./caregivers to continue with ROM; 40th: pt/caregivers to continue with PROM throughout RUE   Time 6   Period Weeks    Status On-going    Target Date 05/16/2022             OT Long Term Goals -       OT LONG TERM GOAL #1   Title Pt. will engage the RUE as a gross assist/atabilizer during ADLs 100% of the time    Baseline 04/04/2022: Pt. Is using her right hand to hold the loofa, and stabilize items.30th visit: Pt. is initiating using her RUE as a gross stabilizer with maxA 01/06/2022: Pt. is starting to engage her RUE as a gross stabiliizer with increased cues. Eval: pt. does not currently engage her RUE during ADLs. 10th visit: Pt. continues to need to work towards using her RUE as a gross stabilizer.    Time 12    Status On-going    Target Date 06/27/22      OT  LONG TERM GOAL #2   Title Pt. will perform UE dressing using one armed dressing techniques    Baseline Eval: MaxA, 10th visit: ModA 01/06/2022: min-modA 30th visit; min0-modA, 04/04/2022: minA donning loose shirts, MaxA tighter items/sports bra, independent doffing a shirt and bra    Time 12    Period Weeks    Status On-going    Target Date 06/27/22      OT LONG TERM GOAL #3   Title Pt. will require minA to stand and hike pants    Baseline Eval: MaxA, 10th visit: ModA 01/06/2022: ModA 30th visit: ModA 04/04/2022: independent with elastic pants if above the knees. MaxA if they fall below the knee; 04/07/22: min guard-min A to reach below knees to simulate hiking pants   Time 12    Period Weeks  Status On-going    Target Date 06/27/22      OT LONG TERM GOAL #4   Title Pt. will perform LE dressing with minA    Baseline Eval: MaxA 10th visit: Pt. requires MaxA for donning her brace, shoes, and socks. ModA with pants. 01/06/2022: ModA pants, MaxA shoes, socks, and brace 30th visit: ModA pants, MaxA shoes, socks, and brace. 04/04/2022: MaxA; 04/07/22: pt can don/doff L sock and shoe with supv, dep for RLE; see above for hiking pants   Time 12    Period Weeks    Status On-going    Target Date 06/27/22      OT LONG TERM GOAL #5   Title Pt. will improve FOTO score by 2 pt. to reflect funcational change    Baseline Eval: FOTO score: 12, 10th visit: FOTO score 27 01/06/2022: 27 30th visit TBA at next visit secondary to no family available during the session; 04/04/22: FOTO 40   Time 12    Period Weeks    Status On-going    Target Date 06/27/22      OT LONG TERM GOAL #6   Title Patient will transfer to bedside commode or toilet with supervision.    Baseline 30th visit: min-modA. 01/06/2022: min-ModA 04/04/2022: Min-ModA   Time 4    Period Weeks    Status On-going    Target Date 06/27/22        OT LONG TERM GOAL #7                                                      Title: Pt. Will improve AROM wrist  extension by 5 degrees in preparation for improving functional  reaching.                                                                                  Baseline:       7/03: -28(72)            Time: 4           Period: Weeks           Status: Ongoing           Target Date: 06/27/2022   Plan - 03/24/22 1429     Clinical Impression Statement Pt. continues to present with flexor tone, and tightness through the RUE, wrist, and digits, however was able able to achieve increased PROM in the right shoulder and demonstrated good tolerance with AAROM for BUEs using 1# dowel.  Pt practiced use of R hand as a stabilizer to open medication bottles with initial max vc and demo.  Pt required min A for push and turn bottles, but was able to manage easy twist off bottles with supv and extra time.  Made attempts at volitional grasping with hand around pill bottle while L hand twisted cap, but pt was unable to manage these combination movements, and lacked tight enough squeeze to keep pill bottle from turning in hand.  Pt. continues to work on normalizing  tone, and facilitating active volitional movement in the RUE, and hand to improve engagement of the RUE during ADLs, as well as improve transfers, and functional mobility for toileting.     OT Occupational Profile and History Detailed Assessment- Review of Records and additional review of physical, cognitive, psychosocial history related to current functional performance    Occupational performance deficits (Please refer to evaluation for details): Leisure;IADL's;ADL's    Body Structure / Function / Physical Skills ADL;IADL;FMC;ROM;UE functional use;Strength;Decreased knowledge of use of DME;Coordination    Rehab Potential Fair    Clinical Decision Making Several treatment options, min-mod task modification necessary    Comorbidities Affecting Occupational Performance: May have comorbidities impacting occupational performance    Modification or Assistance to  Complete Evaluation  Min-Moderate modification of tasks or assist with assess necessary to complete eval    OT Frequency 2x / week    OT Duration 12 weeks    OT Treatment/Interventions Self-care/ADL training;Neuromuscular education;Electrical Stimulation;Patient/family education;Therapeutic activities;Passive range of motion;Building services engineer;Therapeutic exercise;DME and/or AE instruction;Moist Heat    Consulted and Agree with Plan of Care Patient            Danelle Earthly, MS, OTR/L  [employer], OT 04/25/2022, 2:30 PM

## 2022-04-25 NOTE — Therapy (Unsigned)
OUTPATIENT PHYSICAL THERAPY TREATMENT NOTE   Patient Name: Lindsey [name] MRN: [id number] DOB:[birth year], 66 y.o., female Today's Date: 04/26/2022  PCP: Dr. Tomasa Hose MD REFERRING PROVIDER: Leeroy Cha MD   PT End of Session - 04/25/22 1351     Visit Number 35    Number of Visits 41    Date for PT Re-Evaluation 05/03/22    Authorization Type Medicare    Authorization Time Period Cert through 02/09/92; Recert 02/07/176-06/05/9029    Progress Note Due on Visit 40    PT Start Time 1346    PT Stop Time 1430    PT Time Calculation (min) 44 min    Equipment Utilized During Treatment Gait belt    Activity Tolerance Patient limited by fatigue;No increased pain    Behavior During Therapy WFL for tasks assessed/performed               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 No pertinent surgical history. 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 ([place]) 09/24/2021   Cerebral embolism with cerebral infarction 09/23/2021   Pyelonephritis 09/19/2021   Pain due to onychomycosis of toenails of both feet 11/19/2020   Normocytic anemia 05/31/2020   Acute ischemic left MCA stroke ([place]) 04/30/2020   Right hemiplegia ([place]) 04/30/2020   Cerebrovascular accident ([place]) 04/21/2020   Atrial fibrillation  with RVR ([place]) 04/21/2020   Essential hypertension 04/21/2020   Alcohol abuse 04/21/2020    REFERRING DIAG: s/p CVA  THERAPY DIAG:  Abnormality of gait and mobility  Difficulty in walking, not elsewhere classified  Unsteadiness on feet  Rationale for Evaluation and Treatment Rehabilitation  PERTINENT HISTORY: Pt was doing very well and was walking short distances wqith a cane prior to second stroke in december. At this point she is limited to wheelchair as her main means of mobility. Pt caregiver reports all detailed information. Pt has hesitation with the left lower extremity and significant weakness on the right lower extremity. Pt will take 2-3 steps when transfering. Pt caregiver assists with all transfers.Pt has shower with ledge to steps over ledge when entering and utilizes bedside commode. Pt has 2 level home with chair lift for upstairs access. Pt would like to improve ransfers, improve neglect on her R side and improve her overall strength and mobility.Pt. is a 67 y.o. 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 Pt. has Right sided weakness, and receptive, and expressive aphasia. Pt. has had a previous CVA with right sided weakness in July 2021. PMHx includes: AFib, Renal Artery thrombosis, situational depression, HTN, Pyelnephritis, Seizure activity, COnstipation, AKI. Vitamin D deficiency, and urinary incontinence.  PRECAUTIONS: fall risk, wearing RLE AFO  SUBJECTIVE: Pt reports doing well.  No significant changes since last session. No pain today, no medication updates.    PAIN:  Are you having pain? No     TODAY'S TREATMENT:  TODAY'S TREATMENT:    INTERVENTIONS:   Pt transferred wheelchair to nustep, stand pivot, min A with min VCs for hand placement and positioning, pt does require increased time but was able to exhibit good R LE control with less A this session TE   Nustep 2 x 3 min level 1,  LE only, nustep R LE brace to keep hip in neutral alignment   Seated   Hamstring curl RTB  x 10 with AAROM to full R knee extension   R LE adduction and abduction to PT hand for external cue x 10 ea   Pt required verbal cues for correct exercise technique. She often compensates for LE weakness with increased trunk movement to facilitate increased LE ROM.   Therapeutic activity:   Performed STS from armchair with UE assist via // bar on L side. Cues for slow eccentric control. X 5 repetitions.     -Ambulated in // bars x 3 laps with turns at each end.  Difficulty with turn sequencing. Cues for keeping LE at appropriate step width with external focus of line in middle of parallel bars.    - Ambulates through parallel bars, transitions to    Pt continues to have difficulty with sequencing of tasks such as sit to stand and turning while standing   Education provided throughout session via VC/TC and demonstration to facilitate movement at target joints and correct muscle activation for all testing and exercises performed.    Pt requested to leave early from appointment due to another engagement so treatment session cut short as a result.    PATIENT EDUCATION: Education details: positioning/exercise Therapist, sports Person educated: Patient Education method: Education officer, environmental, and Verbal cues Education comprehension: verbalized understanding, returned demonstration, and needs further education   HOME EXERCISE PROGRAM: Continue as previously given;    PT Short Term Goals -       PT SHORT TERM GOAL #1   Title Patient will be independent in home exercise program to improve strength/mobility for better functional independence with ADLs.    Baseline no formal HEp for LE/balance at this time; 02/08/2022=Patient continues to require VC for reminders and assist with hip march/knee ext (seated for strengthening); Reminders to continue to work on static stand at home.    Time 4     Period Weeks    Status Partially Met  04/07/22: complete somewhat regularly     Target Date 12/14/21              PT Long Term Goals -       PT LONG TERM GOAL #1   Title Patient will increase FOTO score to equal to or greater than   41  to demonstrate statistically significant improvement in mobility and quality of life.    Baseline 16 on 2/14; 02/08/2022=Patient unable to complete today- No family present but will attempt again next visit available with family present to assist in completion; 02/17/2022= 40    Time 12    Period Weeks    Status On-going    Target Date 05/03/22      PT LONG TERM GOAL #3   Title Patient  will complete five times sit to stand test in < 30 seconds with UE assistance indicating an increased LE strength and improved balance.    Baseline 58.47 s with  UE assist and with A from PT for R LE positioning; 02/08/2022=54 sec with use of L UE support and Min physical assist to position right LE.  7/6: 30.5 sec, min A for LE positioning, some attempts did not come to full erect posture, did not let go with L UE from armchair with any attempt    Time 12    Period Weeks    Status On-going    Target Date 05/03/22      PT LONG TERM GOAL #4   Title Patient will increase Berg Balance score by > 6 points to demonstrate decreased fall risk during functional activities.    Baseline 12 on 2/14; 02/08/2022= 13/56  04/07/22: 17     Time 12    Period Weeks    Status On-going    Target Date 05/03/22      PT LONG TERM GOAL #5   Title Pt will transfer from seated position on table/chair to/from transport chair/WC in order to indicate improved transfer ability.    Baseline Requires Min A for LE positioning and to prevent LOB. 02/08/2022=Patient attempted to perform on her own- Unable to push herself up from chair- Very min assist to position right LE into more flexed position and VC for hand placement. Patient able to stand with min Assist with use of gait belt and VC to lean;  02/17/2022=Patient was able to transfer from transport w/c with some physical assist with Right to swing to pivot to side of chair. She was able to sit to stand today with only CGA and then stand pivot with right arm holding onto arm of chair. Only difficulty was stand to sit- Max VC not to plop into chair. 04/07/22: Pt performed movement of B LE to side of chair independently with chair transfer. Pt required min A with R LE foot placement of floor. Pt requires increased time to complete but complete with CGA only for safety and no cues or A to prevent falling/ plopping into armchair.     Time 12    Period Weeks    Status On-going    Target Date 05/03/22      PT LONG TERM GOAL #6   Title Patient will ambulate > 150 feet with CGA using Hemiwalker on level surfaces for improved household and short community distances.    Baseline 02/08/2022= 40 feet with use of HW, heavy CGA with max VC for gait sequencing and w/c follow. 5/18 = 85 feet with use of HW, close CGA  7/6: 45 feet, heavy use of hemiwalker    Time 12    Period Weeks    Status On-going    Target Date 05/03/22      PT LONG TERM GOAL #7   Title Pt will increase 10MWT by at least 0.13 m/s in order to demonstrate clinically significant improvement in community ambulation.    Baseline 02/08/2022= 0.05 m/s using HW 7/6: .070ms with heavy ue use of hemiwalker and CGA for balance    Time 12    Period Weeks    Status Ongoing     Target Date 05/03/22             Plan -     Clinical Impression Statement Continued with current plan of care as laid out in evaluation and recent prior sessions. Pt remains motivated to advance progress toward goals in order to maximize independence and safety at home. Pt requires high level assistance and cuing for completion of mobility training today  due to somewhat more limited strength and steadiness today. Pt continues to have difficulty with transfer movements and functional activities like sit to and from  stand safely and in controlled fashion. Continued with emphasis and practice with improving her anterolateral weight shift in transfers and standing. Pt will continue to benefit from skilled physical therapy intervention to address impairments, improve QOL, and attain therapy goals.     Personal Factors and Comorbidities Age;Comorbidity 1;Comorbidity 2;Comorbidity 3+;Time since onset of injury/illness/exacerbation;Other    Comorbidities AFib, Renal Artery thrombosis, situational depression, HTN, Pyelnephritis, Seizure activity, COnstipation, AKI    Examination-Activity Limitations Bathing;Bed Mobility;Carry;Locomotion Level;Dressing;Sit;Squat;Stairs;Stand;Toileting;Transfers    Examination-Participation Restrictions Church;Community Activity;Laundry    Stability/Clinical Decision Making Evolving/Moderate complexity    Rehab Potential Fair    PT Frequency 2x / week    PT Duration 12 weeks    PT Treatment/Interventions ADLs/Self Care Home Management;Gait training;Stair training;Therapeutic activities;Therapeutic exercise;Balance training;Neuromuscular re-education;Patient/family education;Wheelchair mobility training;Manual techniques;Energy conservation;Dry needling;Passive range of motion;Taping;Spinal Manipulations;Joint Manipulations    PT Next Visit Plan Gait training, Transfer training, LE strengthening as appropriate.    PT Home Exercise Plan HEP: general LE and postural exercises    Consulted and Agree with Plan of Care Patient             12:09 PM, 04/26/22      Particia Lather, PT, DPT 04/26/2022, 12:09 PM

## 2022-04-28 ENCOUNTER — Ambulatory Visit: Payer: Medicare Other | Admitting: Occupational Therapy

## 2022-04-28 ENCOUNTER — Encounter: Payer: Self-pay | Admitting: Occupational Therapy

## 2022-04-28 ENCOUNTER — Encounter: Payer: Medicare Other | Admitting: Speech Pathology

## 2022-04-28 ENCOUNTER — Encounter: Payer: Self-pay | Admitting: Physical Therapy

## 2022-04-28 ENCOUNTER — Ambulatory Visit: Payer: Medicare Other | Admitting: Physical Therapy

## 2022-04-28 DIAGNOSIS — M6281 Muscle weakness (generalized): Secondary | ICD-10-CM

## 2022-04-28 DIAGNOSIS — R2689 Other abnormalities of gait and mobility: Secondary | ICD-10-CM

## 2022-04-28 DIAGNOSIS — R2681 Unsteadiness on feet: Secondary | ICD-10-CM

## 2022-04-28 DIAGNOSIS — R269 Unspecified abnormalities of gait and mobility: Secondary | ICD-10-CM

## 2022-04-28 DIAGNOSIS — R278 Other lack of coordination: Secondary | ICD-10-CM

## 2022-04-28 DIAGNOSIS — R262 Difficulty in walking, not elsewhere classified: Secondary | ICD-10-CM

## 2022-04-28 NOTE — Therapy (Signed)
OUTPATIENT OCCUPATIONAL THERAPY TREATMENT NOTE   Patient Name: Lindsey [name] MRN: [id number] DOB:10-15-1955, 66 y.o., female Today's Date: 04/28/2022   REFERRING PROVIDER: Tomasa Hose, MD   OT End of Session - 04/28/22 1206     Visit Number 64    Number of Visits 67    Date for OT Re-Evaluation 06/27/22    Authorization Type Progress report period starting 02/17/22    OT Start Time 35    OT Stop Time 1145    OT Time Calculation (min) 45 min    Equipment Utilized During Treatment transport chair    Activity Tolerance Patient tolerated treatment well    Behavior During Therapy WFL for tasks assessed/performed               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 No pertinent surgical history. 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 ([place]) 09/24/2021   Cerebral embolism with cerebral infarction 09/23/2021   Pyelonephritis 09/19/2021   Pain due to onychomycosis of toenails of both feet 11/19/2020   Normocytic anemia 05/31/2020   Acute ischemic left MCA stroke ([place]) 04/30/2020   Right hemiplegia ([place]) 04/30/2020   Cerebrovascular accident ([place]) 04/21/2020   Atrial fibrillation with RVR ([place]) 04/21/2020   Essential hypertension 04/21/2020   Alcohol abuse 04/21/2020    RUE  Measurements (Taken 04/04/22):   PROM Shoulder flexion:  7/03: 0(108)  5/18: 0(105)  PROM Shoulder abduction: 7/03: [id number]  5/18: 0(102)  PROM Elbow:   7/03: 0(0-140)  5/18: 0(0-136)  AROM Wrist extension:  7/03: [id number])  5/18: -40(72)  REFERRING DIAG: CVA  THERAPY DIAG:  Muscle weakness (generalized)  Other lack of coordination  Rationale for Evaluation and Treatment Rehabilitation  PERTINENT HISTORY: Pt. is a 66 y.o. 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 Pt. has Right sided weakness, and receptive, and expressive aphasia. Pt. has had a previous CVA with right sided weakness in July 2021. PMHx includes: AFib, Renal Artery thrombosis, situational depression, HTN, Pyelnephritis, Seizure activity, COnstipation, AKI. Vitamin D deficiency, and urinary incontinence/  PRECAUTIONS: Fall   SUBJECTIVE: Pt.  Reports no pain.    PAIN:  Are you having pain? No     OBJECTIVE:   TODAY'S TREATMENT:   Rationale for Evaluation and Treatment Rehabilitation  OT TREATMENT    Manual Therapy:   Pt. tolerated soft tissue massage to the right scapular musculature. Pt. tolerated scapular mobilizations in elevation, depression, abduction, and rotation following moist heat modality to the right shoulder. Pt. tolerated soft tissue mobilizations for radius on ulna, and metacarpal spread stretches to decrease tightness, and prepare the UE, and hand for ROM and therapeutic Ex.Manual therapy was performed independent of, and in  preparation for there. Ex.    Therapeutic Exercise:   Pt. tolerated PROM through all joint ranges of the RUE, and hand following moist heat modality, and manual therapy. Pt. worked on reps of active right wrist extension, and right supination with support proximally. Pt. Then performed reps of wrist extension with the forearm supported at the tabletop surface. Pt. education was provided about  self-ROM to the right MPs, PIPs, and DIPs. Pt. required hand over hand assist to perform self-ROM. Pt. Education was provided about opportunities to engage the RUE during daily tasks.    Pt. education was provided about the shoulder harness brace. The brace was adjusted for comfort, and to achieve the proper alignment. Pt. continues to present with flexor tone, and tightness through the RUE, wrist, and digits, however was able able to achieve increased PROM in the right shoulder, and active ROM for reps of wrist extension. Pt. continues to respond well to the session with less tone, tightness, and increased ROM noted throughout the RUE following ROM, and manual therapy. Pt. presented with increased tightness with right shoulder abduction, and flexion. Pt. continues to work on normalizing tone, and facilitating active volitional movement in the RUE, and hand to improve engagement of the RUE during ADLs, as well as improve transfers, and functional mobility for toileting.         PATIENT EDUCATION: Education details: dressing techniques, HEP Person educated: Patient Education method: Medical illustrator Education comprehension: verbalized understanding, returned demonstration, verbal cues required, and tactile cues required   HOME EXERCISE PROGRAM   Continue with ongoing HEPs for ROM to the RUE, hand, and digits.    OT Short Term Goals -       OT SHORT TERM GOAL #1   Title Pt and caregiver will be independent with HEP for RUE    Baseline Eval: No current program. 10th visit: Caregivers  report being comfortable with exercises at home. 01/06/2022: Continue ongoing HEPs for UE. 30th visit: Pt./caregivers to continue with ROM; 40th: pt/caregivers to continue with PROM throughout RUE   Time 6   Period Weeks    Status On-going    Target Date 05/16/2022             OT Long Term Goals -       OT LONG TERM GOAL #1   Title Pt. will engage the RUE as a gross assist/atabilizer during  ADLs 100% of the time    Baseline 04/04/2022: Pt. Is using her right hand to hold the loofa, and stabilize items.30th visit: Pt. is initiating using her RUE as a gross stabilizer with maxA 01/06/2022: Pt. is starting to engage her RUE as a gross stabiliizer with increased cues. Eval: pt. does not currently engage her RUE during ADLs. 10th visit: Pt. continues to need to work towards using her RUE as a gross stabilizer.    Time 12    Status On-going    Target Date 06/27/22      OT LONG TERM GOAL #2   Title Pt. will perform UE dressing using one armed dressing techniques    Baseline Eval: MaxA, 10th visit: ModA 01/06/2022: min-modA 30th visit; min0-modA, 04/04/2022: minA donning loose shirts, MaxA tighter items/sports bra, independent doffing a shirt and bra    Time 12    Period Weeks    Status On-going    Target Date 06/27/22      OT LONG TERM GOAL #3   Title Pt. will require minA to stand and hike  pants    Baseline Eval: MaxA, 10th visit: [place] 01/06/2022: [place] 30th visit: [place] 04/04/2022: independent with elastic pants if above the knees. MaxA if they fall below the knee; 04/07/22: min guard-min A to reach below knees to simulate hiking pants   Time 12    Period Weeks    Status On-going    Target Date 06/27/22      OT LONG TERM GOAL #4   Title Pt. will perform LE dressing with minA    Baseline Eval: MaxA 10th visit: Pt. requires MaxA for donning her brace, shoes, and socks. ModA with pants. 01/06/2022: ModA pants, MaxA shoes, socks, and brace 30th visit: ModA pants, MaxA shoes, socks, and brace. 04/04/2022: MaxA; 04/07/22: pt can don/doff L sock and shoe with supv, dep for RLE; see above for hiking pants   Time 12    Period Weeks    Status On-going    Target Date 06/27/22      OT LONG TERM GOAL #5   Title Pt. will improve FOTO score by 2 pt. to reflect funcational change    Baseline Eval: FOTO score: 12, 10th visit: FOTO score 27 01/06/2022: 27 30th visit TBA at next visit secondary to no family  available during the session; 04/04/22: FOTO 40   Time 12    Period Weeks    Status On-going    Target Date 06/27/22      OT LONG TERM GOAL #6   Title Patient will transfer to bedside commode or toilet with supervision.    Baseline 30th visit: min-modA. 01/06/2022: min-ModA 04/04/2022: Min-ModA   Time 4    Period Weeks    Status On-going    Target Date 06/27/22        OT LONG TERM GOAL #7                                                      Title: Pt. Will improve AROM wrist extension by 5 degrees in preparation for improving functional  reaching.                                                                                  Baseline:       7/03: -28(72)            Time: 4           Period: Weeks           Status: Ongoing           Target Date: 06/27/2022   Plan - 03/24/22 1429     Clinical Impression Statement Pt. education was provided about the shoulder harness brace. The brace was adjusted for comfort, and to achieve the proper alignment. Pt. continues to present with flexor tone, and tightness through the RUE, wrist, and digits, however was able able to achieve increased PROM in the right shoulder, and active ROM for reps of wrist extension. Pt. continues to respond well to the session with less tone, tightness, and increased ROM noted  throughout the RUE following ROM, and manual therapy. Pt. presented with increased tightness with right shoulder abduction, and flexion. Pt. continues to work on normalizing tone, and facilitating active volitional movement in the RUE, and hand to improve engagement of the RUE during ADLs, as well as improve transfers, and functional mobility for toileting.                  OT Occupational Profile and History Detailed Assessment- Review of Records and additional review of physical, cognitive, psychosocial history related to current functional performance    Occupational performance deficits (Please refer to evaluation for details):  Leisure;IADL's;ADL's    Body Structure / Function / Physical Skills ADL;IADL;FMC;ROM;UE functional use;Strength;Decreased knowledge of use of DME;Coordination    Rehab Potential Fair    Clinical Decision Making Several treatment options, min-mod task modification necessary    Comorbidities Affecting Occupational Performance: May have comorbidities impacting occupational performance    Modification or Assistance to Complete Evaluation  Min-Moderate modification of tasks or assist with assess necessary to complete eval    OT Frequency 2x / week    OT Duration 12 weeks    OT Treatment/Interventions Self-care/ADL training;Neuromuscular education;Electrical Stimulation;Patient/family education;Therapeutic activities;Passive range of motion;Building services engineer;Therapeutic exercise;DME and/or AE instruction;Moist Heat    Consulted and Agree with Plan of Care Patient             Olegario Messier, MS, OTR/L   Olegario Messier, OT 04/28/2022, 12:13 PM

## 2022-04-28 NOTE — Therapy (Signed)
OUTPATIENT PHYSICAL THERAPY TREATMENT NOTE   Patient Name: Lindsey [name] MRN: [id number] DOB:06-18-1956, 66 y.o., female Today's Date: 04/28/2022  PCP: Dr. Tomasa Hose MD REFERRING PROVIDER: Leeroy Cha MD   PT End of Session - 04/28/22 1354     Visit Number 36    Number of Visits 41    Date for PT Re-Evaluation 05/03/22    Authorization Type Medicare    Authorization Time Period Cert through 4/0/10; Recert 11/09/2534-03/06/4033    Progress Note Due on Visit 40    PT Start Time 1302    PT Stop Time 1345    PT Time Calculation (min) 43 min    Equipment Utilized During Treatment Gait belt    Activity Tolerance Patient limited by fatigue;No increased pain    Behavior During Therapy WFL for tasks assessed/performed                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 No pertinent surgical history. 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 ([place]) 09/24/2021   Cerebral embolism with cerebral infarction 09/23/2021   Pyelonephritis 09/19/2021   Pain due to onychomycosis of toenails of both feet 11/19/2020   Normocytic anemia 05/31/2020   Acute ischemic left MCA stroke ([place]) 04/30/2020   Right hemiplegia ([place]) 04/30/2020   Cerebrovascular accident ([place]) 04/21/2020   Atrial fibrillation  with RVR ([place]) 04/21/2020   Essential hypertension 04/21/2020   Alcohol abuse 04/21/2020    REFERRING DIAG: s/p CVA  THERAPY DIAG:  Unsteadiness on feet  Abnormality of gait and mobility  Difficulty in walking, not elsewhere classified  Other abnormalities of gait and mobility  Rationale for Evaluation and Treatment Rehabilitation  PERTINENT HISTORY: Pt was doing very well and was walking short distances wqith a cane prior to second stroke in december. At this point she is limited to wheelchair as her main means of mobility. Pt caregiver reports all detailed information. Pt has hesitation with the left lower extremity and significant weakness on the right lower extremity. Pt will take 2-3 steps when transfering. Pt caregiver assists with all transfers.Pt has shower with ledge to steps over ledge when entering and utilizes bedside commode. Pt has 2 level home with chair lift for upstairs access. Pt would like to improve ransfers, improve neglect on her R side and improve her overall strength and mobility.Pt. is a 66 y.o. 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 Pt. has Right sided weakness, and receptive, and expressive aphasia. Pt. has had a previous CVA with right sided weakness in July 2021. PMHx includes: AFib, Renal Artery thrombosis, situational depression, HTN, Pyelnephritis, Seizure activity, COnstipation, AKI. Vitamin D deficiency, and urinary incontinence.  PRECAUTIONS: fall risk, wearing  RLE AFO  SUBJECTIVE: Pt reports doing well. No significant changes since last session. No pain today, no medication updates.    PAIN:  Are you having pain? No     TODAY'S TREATMENT:  TODAY'S TREATMENT:    INTERVENTIONS:   Pt transferred wheelchair to nustep, stand pivot, min A with min VCs for hand placement and positioning, pt does require increased time but was able to exhibit good R LE control with less A  this session TE   Nustep x 3 min level 1, x 3 min level 2, able to complete but with reduced SPM, LE only, nustep R LE brace to keep hip in neutral alignment   Seated   Hamstring curl RTB  x 10 with AAROM to full R knee extension   R LE adduction and abduction to PT hand for external cue x 10 ea   Pt required verbal cues for correct exercise technique. She often compensates for LE weakness with increased trunk movement to facilitate increased LE ROM.   Therapeutic activity:   Performed STS and pivot transfer to another arm chair x 4 to each side ( 8 total)  - pt utilized PT for support with her L UE and requires cues for reaching for armchair on several reps.     -Ambulated in // bars x 2 laps with turns at each end.  Difficulty with turn sequencing. Cues for keeping LE at appropriate step width with external focus of line in middle of parallel bars.   -at end of 3rd length of parallel bars pt challenged to move R LE into abduction to ipmrove efficacy with turns.   Ambulated through parallel bars and transitioned to hemiwalker then ambulated 10 feet to transport chair to end session. Still has difficulty with sequencing turns for stand to sit transfer.    Pt continues to have difficulty with sequencing of tasks such as sit to stand and turning while standing, working on specific interventions to target parts of this movement.   Education provided throughout session via VC/TC and demonstration to facilitate movement at target joints and correct muscle activation for all testing and exercises performed.     PATIENT EDUCATION: Education details: positioning/exercise Therapist, sports Person educated: Patient Education method: Education officer, environmental, and Verbal cues Education comprehension: verbalized understanding, returned demonstration, and needs further education   HOME EXERCISE PROGRAM: Continue as previously given;    PT Short Term Goals -       PT SHORT TERM GOAL  #1   Title Patient will be independent in home exercise program to improve strength/mobility for better functional independence with ADLs.    Baseline no formal HEp for LE/balance at this time; 02/08/2022=Patient continues to require VC for reminders and assist with hip march/knee ext (seated for strengthening); Reminders to continue to work on static stand at home.    Time 4    Period Weeks    Status Partially Met  04/07/22: complete somewhat regularly     Target Date 12/14/21              PT Long Term Goals -       PT LONG TERM GOAL #1   Title Patient will increase FOTO score to equal to or greater than   41  to demonstrate statistically significant improvement in mobility and quality of life.    Baseline 16 on 2/14; 02/08/2022=Patient unable to complete today- No family present but will attempt again next visit available with family present to assist in  completion; 02/17/2022= 40    Time 12    Period Weeks    Status On-going    Target Date 05/03/22      PT LONG TERM GOAL #3   Title Patient  will complete five times sit to stand test in < 30 seconds with UE assistance indicating an increased LE strength and improved balance.    Baseline 58.47 s with UE assist and with A from PT for R LE positioning; 02/08/2022=54 sec with use of L UE support and Min physical assist to position right LE.  7/6: 30.5 sec, min A for LE positioning, some attempts did not come to full erect posture, did not let go with L UE from armchair with any attempt    Time 12    Period Weeks    Status On-going    Target Date 05/03/22      PT LONG TERM GOAL #4   Title Patient will increase Berg Balance score by > 6 points to demonstrate decreased fall risk during functional activities.    Baseline 12 on 2/14; 02/08/2022= 13/56  04/07/22: 17     Time 12    Period Weeks    Status On-going    Target Date 05/03/22      PT LONG TERM GOAL #5   Title Pt will transfer from seated position on table/chair to/from transport  chair/WC in order to indicate improved transfer ability.    Baseline Requires Min A for LE positioning and to prevent LOB. 02/08/2022=Patient attempted to perform on her own- Unable to push herself up from chair- Very min assist to position right LE into more flexed position and VC for hand placement. Patient able to stand with min Assist with use of gait belt and VC to lean; 02/17/2022=Patient was able to transfer from transport w/c with some physical assist with Right to swing to pivot to side of chair. She was able to sit to stand today with only CGA and then stand pivot with right arm holding onto arm of chair. Only difficulty was stand to sit- Max VC not to plop into chair. 04/07/22: Pt performed movement of B LE to side of chair independently with chair transfer. Pt required min A with R LE foot placement of floor. Pt requires increased time to complete but complete with CGA only for safety and no cues or A to prevent falling/ plopping into armchair.     Time 12    Period Weeks    Status On-going    Target Date 05/03/22      PT LONG TERM GOAL #6   Title Patient will ambulate > 150 feet with CGA using Hemiwalker on level surfaces for improved household and short community distances.    Baseline 02/08/2022= 40 feet with use of HW, heavy CGA with max VC for gait sequencing and w/c follow. 5/18 = 85 feet with use of HW, close CGA  7/6: 45 feet, heavy use of hemiwalker    Time 12    Period Weeks    Status On-going    Target Date 05/03/22      PT LONG TERM GOAL #7   Title Pt will increase 10MWT by at least 0.13 m/s in order to demonstrate clinically significant improvement in community ambulation.    Baseline 02/08/2022= 0.05 m/s using HW 7/6: .03ms with heavy ue use of hemiwalker and CGA for balance    Time 12    Period Weeks    Status Ongoing  Target Date 05/03/22             Plan -     Clinical Impression Statement Continued with current plan of care as laid out in evaluation and  recent prior sessions. Pt remains motivated to advance progress toward goals in order to maximize independence and safety at home. Pt requires high level assistance and cuing for completion of mobility training today due to somewhat more limited strength and steadiness today. Pt continues to have difficulty with transfer movements and functional activities like sit to and from stand safely and in controlled fashion, practiced this extensively in therapy today and with practice pt did improve in efficacy but not consistently. Continued with emphasis and practice with improving her anterolateral weight shift in transfers and standing. Pt will continue to benefit from skilled physical therapy intervention to address impairments, improve QOL, and attain therapy goals.     Personal Factors and Comorbidities Age;Comorbidity 1;Comorbidity 2;Comorbidity 3+;Time since onset of injury/illness/exacerbation;Other    Comorbidities AFib, Renal Artery thrombosis, situational depression, HTN, Pyelnephritis, Seizure activity, COnstipation, AKI    Examination-Activity Limitations Bathing;Bed Mobility;Carry;Locomotion Level;Dressing;Sit;Squat;Stairs;Stand;Toileting;Transfers    Examination-Participation Restrictions Church;Community Activity;Laundry    Stability/Clinical Decision Making Evolving/Moderate complexity    Rehab Potential Fair    PT Frequency 2x / week    PT Duration 12 weeks    PT Treatment/Interventions ADLs/Self Care Home Management;Gait training;Stair training;Therapeutic activities;Therapeutic exercise;Balance training;Neuromuscular re-education;Patient/family education;Wheelchair mobility training;Manual techniques;Energy conservation;Dry needling;Passive range of motion;Taping;Spinal Manipulations;Joint Manipulations    PT Next Visit Plan Gait training, Transfer training, LE strengthening as appropriate.    PT Home Exercise Plan HEP: general LE and postural exercises    Consulted and Agree with Plan of  Care Patient             1:56 PM, 04/28/22      Particia Lather, PT, DPT 04/28/2022, 1:56 PM

## 2022-05-02 ENCOUNTER — Ambulatory Visit: Payer: Medicare Other | Admitting: Physical Therapy

## 2022-05-02 ENCOUNTER — Encounter: Payer: Medicare Other | Admitting: Speech Pathology

## 2022-05-02 ENCOUNTER — Encounter: Payer: Self-pay | Admitting: Physical Therapy

## 2022-05-02 ENCOUNTER — Ambulatory Visit: Payer: Medicare Other

## 2022-05-02 DIAGNOSIS — R278 Other lack of coordination: Secondary | ICD-10-CM

## 2022-05-02 DIAGNOSIS — R2689 Other abnormalities of gait and mobility: Secondary | ICD-10-CM

## 2022-05-02 DIAGNOSIS — M6281 Muscle weakness (generalized): Secondary | ICD-10-CM | POA: Diagnosis not present

## 2022-05-02 DIAGNOSIS — R2681 Unsteadiness on feet: Secondary | ICD-10-CM

## 2022-05-02 DIAGNOSIS — I63542 Cerebral infarction due to unspecified occlusion or stenosis of left cerebellar artery: Secondary | ICD-10-CM

## 2022-05-02 DIAGNOSIS — R269 Unspecified abnormalities of gait and mobility: Secondary | ICD-10-CM

## 2022-05-02 DIAGNOSIS — R262 Difficulty in walking, not elsewhere classified: Secondary | ICD-10-CM

## 2022-05-02 NOTE — Therapy (Signed)
OUTPATIENT OCCUPATIONAL THERAPY TREATMENT NOTE   Patient Name: Lindsey [name] MRN: [id number] DOB:15-Jun-1956, 66 y.o., female Today's Date: 05/02/2022   REFERRING PROVIDER: Karie Fetch, MD   OT End of Session - 05/02/22 1438     Visit Number 47    Number of Visits 72    Date for OT Re-Evaluation 06/27/22    Authorization Type Progress report period starting 02/17/22    OT Start Time 1106    OT Stop Time 1145    OT Time Calculation (min) 39 min    Equipment Utilized During Treatment transport chair    Activity Tolerance Patient tolerated treatment well    Behavior During Therapy WFL for tasks assessed/performed               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) 09/24/2021   Cerebral embolism with cerebral infarction 09/23/2021   Pyelonephritis 09/19/2021   Pain due to onychomycosis of toenails of both feet 11/19/2020   Normocytic anemia 05/31/2020   Acute ischemic left MCA stroke (HCC) 04/30/2020   Right hemiplegia (HCC) 04/30/2020   Cerebrovascular accident (HCC) 04/21/2020   Atrial fibrillation with RVR (HCC) 04/21/2020   Essential hypertension 04/21/2020   Alcohol abuse 04/21/2020    RUE Measurements (Taken  04/04/22):   PROM Shoulder flexion:  7/03: 0(108)  5/18: 0(105)  PROM Shoulder abduction: 7/03: 6(629)  5/18: 0(102)  PROM Elbow:   7/03: 0(0-140)  5/18: 0(0-136)  AROM Wrist extension:  7/03: -47(65)  5/18: -40(72)  REFERRING DIAG: CVA  THERAPY DIAG:  Muscle weakness (generalized)  Other lack of coordination  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 is a 66 y.o. 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 Pt. has Right sided weakness, and receptive, and expressive aphasia. Pt. has had a previous CVA with right sided weakness in July 2021. PMHx includes: AFib, Renal Artery thrombosis, situational depression, HTN, Pyelnephritis, Seizure activity, COnstipation, AKI. Vitamin D deficiency, and urinary incontinence/  PRECAUTIONS: Fall   SUBJECTIVE: Pt slightly more somber today.  Grandson stated "That's just been her mood today."  Though mood seemed improved by end of OT session   PAIN:  Are you having pain? No   OBJECTIVE:   TODAY'S TREATMENT:   Rationale for Evaluation and Treatment Rehabilitation  OT TREATMENT     Therapeutic Exercise:  Pt. tolerated passive shoulder retraction and depression to the R shoulder, and PROM through all joint ranges of the RUE and hand with simultaneous moist heat modality, working to increase flexibility in  prep for neuro re-ed and functional transfers.  Performed sit to stand transfers x5 trials with min guard.  Once in standing, practiced head turns R/L to challenge dynamic standing for standing ADLs, with pt able to maintain balance with min guard and use of hemi walker.    Neuro re-ed: Performed muscle tapping to forearm flexors to facilitate R hand grip squeezes x10, and tapping to the bicep for elbow flexion, working towards increased  muscle activation and strength to utilize RUE to hold/stabilize light ADL supplies.  Engaged R hand into WB positions with total assist on R knee and arm rest of chair during sit to stand transfers.  OT stabilized R wrist and provided support to maintain forearm pronation for optimal WB position of the R arm during sit to stand transfers.     PATIENT EDUCATION: Education details: WB through RUE during sit to stand transfers Person educated: Patient Education method: Customer service manager Education comprehension: verbalized understanding, returned demonstration, verbal cues required, and tactile cues required   [place] with ongoing HEPs for ROM to the RUE, hand, and digits.    OT Short Term Goals -       OT SHORT TERM GOAL #1   Title Pt and caregiver will be independent with HEP for RUE    Baseline Eval: No current program. 10th visit: Caregivers  report being comfortable with exercises at home. 01/06/2022: Continue ongoing HEPs for UE. 30th visit: Pt./caregivers to continue with ROM; 40th: pt/caregivers to continue with PROM throughout RUE   Time 6   Period Weeks    Status On-going    Target Date 05/16/2022             OT Long Term Goals -       OT LONG TERM GOAL #1   Title Pt. will engage the RUE as a gross assist/atabilizer during ADLs 100% of the time    Baseline 04/04/2022: Pt. Is using her right hand to hold the loofa, and stabilize items.30th visit: Pt. is initiating using her RUE as a gross stabilizer with maxA 01/06/2022: Pt. is starting to engage her RUE as a gross stabiliizer with increased cues. Eval: pt. does not currently engage her RUE during ADLs. 10th visit: Pt. continues to need to work towards using her RUE as a gross stabilizer.    Time 12    Status On-going    Target Date 06/27/22      OT LONG TERM GOAL #2   Title Pt. will perform UE dressing using one armed dressing techniques    Baseline Eval: MaxA, 10th visit: [place] 01/06/2022:  min-modA 30th visit; min0-modA, 04/04/2022: minA donning loose shirts, MaxA tighter items/sports bra, independent doffing a shirt and bra    Time 12    Period Weeks    Status On-going    Target Date 06/27/22      OT LONG TERM GOAL #3   Title Pt. will require minA to stand and hike pants    Baseline Eval: MaxA, 10th visit: [place] 01/06/2022: [place] 30th visit: [place] 04/04/2022: independent with elastic pants if above the knees. MaxA if they fall below the knee; 04/07/22: min guard-min A to reach below knees to simulate hiking pants   Time 12    Period Weeks    Status On-going    Target Date 06/27/22      OT LONG TERM GOAL #4   Title Pt. will perform LE dressing with minA    Baseline Eval: MaxA  10th visit: Pt. requires MaxA for donning her brace, shoes, and socks. ModA with pants. 01/06/2022: ModA pants, MaxA shoes, socks, and brace 30th visit: ModA pants, MaxA shoes, socks, and brace. 04/04/2022: MaxA; 04/07/22: pt can don/doff L sock and shoe with supv, dep for RLE; see above for hiking pants   Time 12    Period Weeks    Status On-going    Target Date 06/27/22      OT LONG TERM GOAL #5   Title Pt. will improve FOTO score by 2 pt. to reflect funcational change    Baseline Eval: FOTO score: 12, 10th visit: FOTO score 27 01/06/2022: 27 30th visit TBA at next visit secondary to no family available during the session; 04/04/22: FOTO 40   Time 12    Period Weeks    Status On-going    Target Date 06/27/22      OT LONG TERM GOAL #6   Title Patient will transfer to bedside commode or toilet with supervision.    Baseline 30th visit: min-modA. 01/06/2022: min-ModA 04/04/2022: Min-ModA   Time 4    Period Weeks    Status On-going    Target Date 06/27/22        OT LONG TERM GOAL #7                                                      Title: Pt. Will improve AROM wrist extension by 5 degrees in preparation for improving functional  reaching.                                                                                   Baseline:       7/03: -28(72)            Time: 4           Period: Weeks           Status: Ongoing           Target Date: 06/27/2022   Plan - 03/24/22 1429     Clinical Impression Statement Pt responded well to PROM and neuro re-ed activities this date throughout [place].  Total assist to engage R hand into WB position during a sit to stand transfer, and pt requires OT to stabilize wrist and forearm to avoid pain during WB and transfer.  Overall, pt is requiring fewer cues for her sit to stand transfer technique and was able to perform head turns L/R in standing with min guard and support of hemi walker, working to increase dynamic standing balance for ADLs.  Pt continues to work on normalizing tone, and facilitating active volitional movement in the RUE, and hand to improve engagement of the RUE during ADLs, as well as improve transfers, and functional mobility for toileting.     OT Occupational Profile and History Detailed Assessment- Review of Records and additional review of physical, cognitive, psychosocial history related to current functional performance    Occupational performance deficits (Please refer to evaluation for details): Leisure;IADL's;ADL's  Body Structure / Function / Physical Skills ADL;IADL;FMC;ROM;UE functional use;Strength;Decreased knowledge of use of DME;Coordination    Rehab Potential Fair    Clinical Decision Making Several treatment options, min-mod task modification necessary    Comorbidities Affecting Occupational Performance: May have comorbidities impacting occupational performance    Modification or Assistance to Complete Evaluation  Min-Moderate modification of tasks or assist with assess necessary to complete eval    OT Frequency 2x / week    OT Duration 12 weeks    OT Treatment/Interventions Self-care/ADL training;Neuromuscular education;Electrical Stimulation;Patient/family education;Therapeutic activities;Passive range of motion;Marine scientist;Therapeutic exercise;DME and/or AE instruction;Moist Heat    Consulted and Agree with Plan of Care Patient            Danelle Earthly, MS, OTR/L  [employer], OT 05/02/2022, 2:41 PM

## 2022-05-02 NOTE — Therapy (Signed)
OUTPATIENT PHYSICAL THERAPY TREATMENT NOTE   Patient Name: Lindsey [name] MRN: [id number] DOB:[birth year], 66 y.o., female Today's Date: 05/02/2022  PCP: Dr. Tomasa Hose MD REFERRING PROVIDER: Leeroy Cha MD   PT End of Session - 05/02/22 1149     Visit Number 37    Number of Visits 41    Date for PT Re-Evaluation 05/03/22    Authorization Type Medicare    Authorization Time Period Cert through 0/9/32; Recert 03/09/1244-8/0/9983    Progress Note Due on Visit 40    PT Start Time 1148    PT Stop Time 1230    PT Time Calculation (min) 42 min    Equipment Utilized During Treatment Gait belt    Activity Tolerance Patient limited by fatigue;No increased pain    Behavior During Therapy WFL for tasks assessed/performed                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 No pertinent surgical history. 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 ([place]) 09/24/2021   Cerebral embolism with cerebral infarction 09/23/2021   Pyelonephritis 09/19/2021   Pain due to onychomycosis of toenails of both feet 11/19/2020   Normocytic anemia 05/31/2020   Acute ischemic left MCA stroke ([place]) 04/30/2020   Right hemiplegia ([place]) 04/30/2020   Cerebrovascular accident ([place]) 04/21/2020   Atrial fibrillation  with RVR ([place]) 04/21/2020   Essential hypertension 04/21/2020   Alcohol abuse 04/21/2020    REFERRING DIAG: s/p CVA  THERAPY DIAG:  Unsteadiness on feet  Abnormality of gait and mobility  Difficulty in walking, not elsewhere classified  Other abnormalities of gait and mobility  Muscle weakness (generalized)  Other lack of coordination  Rationale for Evaluation and Treatment Rehabilitation  PERTINENT HISTORY: Pt was doing very well and was walking short distances wqith a cane prior to second stroke in december. At this point she is limited to wheelchair as her main means of mobility. Pt caregiver reports all detailed information. Pt has hesitation with the left lower extremity and significant weakness on the right lower extremity. Pt will take 2-3 steps when transfering. Pt caregiver assists with all transfers.Pt has shower with ledge to steps over ledge when entering and utilizes bedside commode. Pt has 2 level home with chair lift for upstairs access. Pt would like to improve ransfers, improve neglect on her R side and improve her overall strength and mobility.Pt. is a 66 y.o. 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 Pt. has Right sided weakness, and receptive, and expressive aphasia. Pt. has had a previous CVA with right sided weakness in July 2021. PMHx includes: AFib, Renal Artery thrombosis, situational depression, HTN, Pyelnephritis, Seizure activity, COnstipation, AKI. Vitamin D  deficiency, and urinary incontinence.  PRECAUTIONS: fall risk, wearing RLE AFO  SUBJECTIVE: Pt reports doing well. No significant changes since last session. No pain today, no medication updates.    PAIN:  Are you having pain? No     TODAY'S TREATMENT:  TODAY'S TREATMENT:    INTERVENTIONS:  TE   Seated   Hamstring curl RTB  2x 10 with AAROM to full R knee extension   Red tband around BLE: hip abduction/adduction  with therapist providing visual cues for increased AROM in RLE x15 reps BLE;   Pt required verbal cues for correct exercise technique. She often compensates for LE weakness with increased trunk movement to facilitate increased LE ROM. She reports moderate fatigue after exercise;    Therapeutic activity:      Instructed patient in stand pivot transfer mat table to arm chair x1 set with min A and mod VCs for hand placement;   Performed STS  from arm chair x 5 - Pt required min A and mod VCs for positioning/sequencing;   Pt ambulated 50 feet with hemiwalker in LUE, requiring min A for safety; She ambulates with narrow base of support, uneven step length with step to gait pattern; She required mod VCs for walker placement/positioning, mod VCs to increase base of support of LE with increasing RLE hip abduction, increasing step length for better gait safety;  When she went to sit on mat table, pt required max VCS for sequencing for safety including LE positioning and to reach back to mat table with UE;    Pt continues to have difficulty with sequencing of tasks such as sit to stand and turning while standing, working on specific interventions to target parts of this movement.   Education provided throughout session via VC/TC and demonstration to facilitate movement at target joints and correct muscle activation for all testing and exercises performed.     PATIENT EDUCATION: Education details: positioning/exercise Therapist, sports Person educated: Patient Education method: Education officer, environmental, and Verbal cues Education comprehension: verbalized understanding, returned demonstration, and needs further education   HOME EXERCISE PROGRAM: Continue as previously given;    PT Short Term Goals -       PT SHORT TERM GOAL #1   Title Patient will be independent in home exercise program to improve strength/mobility for better functional independence with ADLs.    Baseline no formal HEp  for LE/balance at this time; 02/08/2022=Patient continues to require VC for reminders and assist with hip march/knee ext (seated for strengthening); Reminders to continue to work on static stand at home.    Time 4    Period Weeks    Status Partially Met  04/07/22: complete somewhat regularly     Target Date 12/14/21              PT Long Term Goals -       PT LONG TERM GOAL #1   Title Patient will increase FOTO score to equal to or greater than   41  to demonstrate statistically significant improvement in mobility and quality of life.    Baseline 16 on 2/14; 02/08/2022=Patient unable to complete today- No family present but will attempt again next visit available with family present to assist in completion; 02/17/2022= 40    Time 12    Period Weeks    Status On-going    Target Date 05/03/22      PT LONG TERM GOAL #3   Title Patient  will complete five times sit to stand test  in < 30 seconds with UE assistance indicating an increased LE strength and improved balance.    Baseline 58.47 s with UE assist and with A from PT for R LE positioning; 02/08/2022=54 sec with use of L UE support and Min physical assist to position right LE.  7/6: 30.5 sec, min A for LE positioning, some attempts did not come to full erect posture, did not let go with L UE from armchair with any attempt    Time 12    Period Weeks    Status On-going    Target Date 05/03/22      PT LONG TERM GOAL #4   Title Patient will increase Berg Balance score by > 6 points to demonstrate decreased fall risk during functional activities.    Baseline 12 on 2/14; 02/08/2022= 13/56  04/07/22: 17     Time 12    Period Weeks    Status On-going    Target Date 05/03/22      PT LONG TERM GOAL #5   Title Pt will transfer from seated position on table/chair to/from transport chair/WC in order to indicate improved transfer ability.    Baseline Requires Min A for LE positioning and to prevent LOB. 02/08/2022=Patient attempted to perform on  her own- Unable to push herself up from chair- Very min assist to position right LE into more flexed position and VC for hand placement. Patient able to stand with min Assist with use of gait belt and VC to lean; 02/17/2022=Patient was able to transfer from transport w/c with some physical assist with Right to swing to pivot to side of chair. She was able to sit to stand today with only CGA and then stand pivot with right arm holding onto arm of chair. Only difficulty was stand to sit- Max VC not to plop into chair. 04/07/22: Pt performed movement of B LE to side of chair independently with chair transfer. Pt required min A with R LE foot placement of floor. Pt requires increased time to complete but complete with CGA only for safety and no cues or A to prevent falling/ plopping into armchair.     Time 12    Period Weeks    Status On-going    Target Date 05/03/22      PT LONG TERM GOAL #6   Title Patient will ambulate > 150 feet with CGA using Hemiwalker on level surfaces for improved household and short community distances.    Baseline 02/08/2022= 40 feet with use of HW, heavy CGA with max VC for gait sequencing and w/c follow. 5/18 = 85 feet with use of HW, close CGA  7/6: 45 feet, heavy use of hemiwalker    Time 12    Period Weeks    Status On-going    Target Date 05/03/22      PT LONG TERM GOAL #7   Title Pt will increase 10MWT by at least 0.13 m/s in order to demonstrate clinically significant improvement in community ambulation.    Baseline 02/08/2022= 0.05 m/s using HW 7/6: .034ms with heavy ue use of hemiwalker and CGA for balance    Time 12    Period Weeks    Status Ongoing     Target Date 05/03/22             Plan -     Clinical Impression Statement Patient motivated and participated well within session. She was heavily fatigued at end of session. She was instructed in advanced LE strengthening. She  required min VCS for correct positioning/exercise technique. She does require  verbal/visual cues to increase AROM in RLE. Patient continues to require cues for safety/sequencing with transfers and gait safety. She initially exhibits slight buckling of RLE with initial gait, but this improved with increased distance. However with increased distance she does exhibit narrow base of support with decreased RLE hip abduction and slight scissoring. She was heavily fatigued at end of session; Patient would benefit from additional skilled PT intervention to improve strength, balance and gait safety; Plan to address goals next visit;    Personal Factors and Comorbidities Age;Comorbidity 1;Comorbidity 2;Comorbidity 3+;Time since onset of injury/illness/exacerbation;Other    Comorbidities AFib, Renal Artery thrombosis, situational depression, HTN, Pyelnephritis, Seizure activity, COnstipation, AKI    Examination-Activity Limitations Bathing;Bed Mobility;Carry;Locomotion Level;Dressing;Sit;Squat;Stairs;Stand;Toileting;Transfers    Examination-Participation Restrictions Church;Community Activity;Laundry    Stability/Clinical Decision Making Evolving/Moderate complexity    Rehab Potential Fair    PT Frequency 2x / week    PT Duration 12 weeks    PT Treatment/Interventions ADLs/Self Care Home Management;Gait training;Stair training;Therapeutic activities;Therapeutic exercise;Balance training;Neuromuscular re-education;Patient/family education;Wheelchair mobility training;Manual techniques;Energy conservation;Dry needling;Passive range of motion;Taping;Spinal Manipulations;Joint Manipulations    PT Next Visit Plan Gait training, Transfer training, LE strengthening as appropriate.    PT Home Exercise Plan HEP: general LE and postural exercises    Consulted and Agree with Plan of Care Patient             12:30 PM, 05/02/22      Kennita Pavlovich, PT, DPT 05/02/2022, 12:30 PM

## 2022-05-03 ENCOUNTER — Encounter: Payer: Self-pay | Admitting: Oncology

## 2022-05-03 ENCOUNTER — Encounter: Payer: Self-pay | Admitting: Family Medicine

## 2022-05-03 ENCOUNTER — Inpatient Hospital Stay: Payer: Medicare Other | Attending: Oncology | Admitting: Oncology

## 2022-05-03 VITALS — BP 165/105 | HR 57 | Temp 98.6°F | Ht 67.0 in | Wt 146.0 lb

## 2022-05-03 DIAGNOSIS — N280 Ischemia and infarction of kidney: Secondary | ICD-10-CM | POA: Insufficient documentation

## 2022-05-03 DIAGNOSIS — Z7901 Long term (current) use of anticoagulants: Secondary | ICD-10-CM | POA: Insufficient documentation

## 2022-05-03 DIAGNOSIS — R61 Generalized hyperhidrosis: Secondary | ICD-10-CM | POA: Diagnosis not present

## 2022-05-03 DIAGNOSIS — I6389 Other cerebral infarction: Secondary | ICD-10-CM | POA: Diagnosis not present

## 2022-05-03 DIAGNOSIS — I4891 Unspecified atrial fibrillation: Secondary | ICD-10-CM | POA: Insufficient documentation

## 2022-05-03 DIAGNOSIS — I639 Cerebral infarction, unspecified: Secondary | ICD-10-CM | POA: Insufficient documentation

## 2022-05-03 DIAGNOSIS — R112 Nausea with vomiting, unspecified: Secondary | ICD-10-CM | POA: Insufficient documentation

## 2022-05-03 DIAGNOSIS — D6862 Lupus anticoagulant syndrome: Secondary | ICD-10-CM | POA: Diagnosis not present

## 2022-05-03 DIAGNOSIS — D649 Anemia, unspecified: Secondary | ICD-10-CM | POA: Diagnosis present

## 2022-05-03 DIAGNOSIS — R4182 Altered mental status, unspecified: Secondary | ICD-10-CM | POA: Diagnosis not present

## 2022-05-03 NOTE — Progress Notes (Signed)
Hematology/Oncology Progress note [phone] [phone]         Patient Care Team: Donnie Coffin, MD as PCP - General (Family Medicine) Rico Junker, RN as Registered Nurse Theodore Demark, RN (Inactive) as Registered Nurse Earlie Server, MD as Consulting Physician (Hematology and Oncology)  REFERRING PROVIDER: Donnie Coffin, MD  CHIEF COMPLAINTS/REASON FOR VISIT:   renal artery thrombosis  HISTORY OF PRESENTING ILLNESS:   Lindsey [name] is a  66 y.o.  female with PMH listed below was seen in consultation at the request of  Donnie Coffin, MD  for evaluation of renal artery thrombosis.  Patient initially presented to emergency room for evaluation of nausea, vomiting, diaphoresis, change of mental status.  Her initial evaluation included CT scan of abdomen pelvis which revealed multifocal pyelonephritis of the right kidney.  Patient was treated with intravenous antibiotics.  At baseline, patient has no history of prior left MCA stroke and right hemiplegia and aphasia.  Patient also has history of atrial fibrillation along has been on Eliquis 5 mg twice daily. 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  There was also concern about seizure activities due to decreased responsiveness and neurology started patient on Keppra.  Patient later was transitioned to Levaquin and discharged to inpatient rehab on 09/24/2021.  There he developed leukocytosis, tachycardia and a fever again.  09/26/2021 repeat CT scan of the abdomen pelvis showed interim development of left renal artery thrombus.  Patient was transferred back to acute care internal medicine.  Antibodies for antiphospholipid syndrome were collected while patient was on heparin. 09/28/2021  lupus anticoagulant positive Anticardiolipin IgG, IgM negative, IgA 20 Beta-2 glycoprotein IgG,  IgM, IgA negative Patient was transitioned to Coumadin, currently on 2.5 mg daily.  Patient currently is in rehab and is going to be discharged. Patient was referred to establish care with hematology.   INTERVAL HISTORY Lindsey [name] is a 66 y.o. female who has above history reviewed by me today presents for follow up visit for renal artery thrombosis/CVA Patient is accompanied by her daughter.  She is currently on anticoagulation with Coumadin.  She takes 7.5 mg Tuesdays and Saturdays, 5 mg daily for most of the week.  Coumadin dosing managed by PCP with goal of 2-3 INR has been therapeutic most of the time. Denies any acute bleeding.  Patient has no new complaints today.  Review of Systems  Unable to perform ROS: Other (Aphasia)  Constitutional:  Negative for appetite change.  Respiratory:  Negative for shortness of breath.   Cardiovascular:  Negative for chest pain.  Gastrointestinal:  Negative for abdominal pain.  Musculoskeletal:  Negative for arthralgias.  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 No pertinent surgical history.  SOCIAL HISTORY: Social History   Socioeconomic History   Marital status: Single    Spouse name: Not on file  Number of children: Not on file   Years of education: Not on file   Highest education level: Not on file  Occupational History   Not on file  Tobacco Use   Smoking status: Never    Passive exposure: Never   Smokeless tobacco: Never  Vaping Use   Vaping Use: Never used  Substance and Sexual Activity    Alcohol use: Not Currently    Comment: states once a wine/beer occassionally    Drug use: No   Sexual activity: Not Currently  Other Topics Concern   Not on file  Social History Narrative   Not on file   Social Determinants of Health   Financial Resource Strain: Not on file  Food Insecurity: Not on file  Transportation Needs: Not on file  Physical Activity: Not on file  Stress: Not on file  Social Connections: Not on file  Intimate Partner Violence: Not on file    FAMILY HISTORY: Family History  Problem Relation Age of Onset   Breast cancer Neg Hx     ALLERGIES:  has No Known Allergies.  MEDICATIONS:  Current Outpatient Medications  Medication Sig Dispense Refill   acetaminophen (TYLENOL) 325 MG tablet Take 1-2 tablets (325-650 mg total) by mouth every 4 (four) hours as needed for mild pain.     aspirin 81 MG chewable tablet daily.     atorvastatin (LIPITOR) 20 MG tablet Take 1 tablet (20 mg total) by mouth daily at 6 PM. 90 tablet 3   B Complex-C (B COMPLEX-VITAMIN C) CAPS daily.     Baclofen 5 MG TABS TAKE 1 TABLET BY MOUTH AT BEDTIME 60 tablet 2   diltiazem (TIAZAC) 180 MG 24 hr capsule Take 180 mg by mouth daily.     gabapentin (NEURONTIN) 100 MG capsule Take by mouth.     levETIRAcetam (KEPPRA) 500 MG tablet TAKE 1 TABLET BY MOUTH EVERY 12 HOURS. 180 tablet 2   megestrol (MEGACE) 400 MG/10ML suspension Take 10 mLs (400 mg total) by mouth daily. 240 mL 0   metoprolol tartrate (LOPRESSOR) 50 MG tablet Take 1 tablet (50 mg total) by mouth 2 (two) times daily. 180 tablet 0   Multiple Vitamins-Minerals (MULTIVITAMIN ADULTS 50+) TABS Take 1 tablet by mouth daily.     sertraline (ZOLOFT) 25 MG tablet TAKE 1 TABLET BY MOUTH AT BEDTIME 90 tablet 2   Vitamin D, Ergocalciferol, (DRISDOL) 1.25 MG (50000 UNIT) CAPS capsule Take 1 capsule (50,000 Units total) by mouth every 7 (seven) days. Each Tuesday 12 capsule 0   warfarin (COUMADIN) 2.5 MG tablet TAKE 1 TABLET BY MOUTH ONCE  DAILY AT 4:00 P.M. 90 tablet 2   No current facility-administered medications for this visit.     PHYSICAL EXAMINATION: ECOG PERFORMANCE STATUS: 3 - Symptomatic, >50% confined to bed Vitals:   05/03/22 0959  BP: (!) 165/105  Pulse: (!) 57  Temp: 98.6 F (37 C)   Filed Weights   05/03/22 0959  Weight: 146 lb (66.2 kg)    Physical Exam Constitutional:      General: She is not in acute distress.    Comments: Patient sits in the wheelchair  HENT:     Head: Normocephalic and atraumatic.  Eyes:     General: No scleral icterus. Cardiovascular:     Rate and Rhythm: Normal rate.     Heart sounds: Normal heart sounds.  Pulmonary:     Effort: Pulmonary effort is normal. No respiratory distress.     Breath sounds:  No wheezing.  Abdominal:     General: Bowel sounds are normal. There is no distension.     Palpations: Abdomen is soft.  Musculoskeletal:        General: Normal range of motion.     Cervical back: Normal range of motion and neck supple.     Right lower leg: No edema.  Skin:    General: Skin is warm and dry.     Findings: No erythema or rash.  Neurological:     Mental Status: She is alert. Mental status is at baseline.     Comments: Aphasia  Right hemiplegia     LABORATORY DATA:  I have reviewed the data as listed Lab Results  Component Value Date   WBC 3.6 (L) 01/28/2022   HGB 10.7 (L) 01/28/2022   HCT 33.1 (L) 01/28/2022   MCV 86.0 01/28/2022   PLT 235 01/28/2022   Recent Labs    09/29/21 0155 10/01/21 0045 10/02/21 0518 10/06/21 0042 10/13/21 0532 10/20/21 0537 01/28/22 1356  NA 131*   < > 133*   < > 137 136 137  K 4.6   < > 3.7   < > 3.8 3.8 3.9  CL 98   < > 99   < > 104 104 103  CO2 23   < > 22   < > 21* 22 26  GLUCOSE 134*   < > 107*   < > 96 103* 105*  BUN 13   < > 11   < > 11 8 12   CREATININE 1.32*   < > 1.29*   < > 1.16* 0.98 0.92  CALCIUM 8.6*   < > 8.7*   < > 9.2 8.7* 9.3  GFRNONAA 45*   < > 46*   < > 52* >60 >60  PROT 7.0  --   7.6  --   --   --  8.6*  ALBUMIN 2.2*  --  2.3*  --   --   --  3.8  AST 69*  --  69*  --   --   --  35  ALT 24  --  20  --   --   --  22  ALKPHOS 93  --  84  --   --   --  79  BILITOT 1.0  --  0.7  --   --   --  1.0   < > = values in this interval not displayed.    Iron/TIBC/Ferritin/ %Sat    Component Value Date/Time   IRON 43 10/22/2021 1316   TIBC 230 (L) 10/22/2021 1316   FERRITIN 1,206 (H) 10/22/2021 1316   IRONPCTSAT 19 10/22/2021 1316      RADIOGRAPHIC STUDIES: I have personally reviewed the radiological images as listed and agreed with the findings in the report. No results found.    ASSESSMENT & PLAN:  1. Normocytic anemia   2. Renal artery thrombosis ([place])   3. Cerebrovascular accident (CVA) due to other mechanism [employer])    #renal artery thrombosis and recurrent stroke 01/28/2022 repeat lupus anticoagulant was negative.  Patient does not have antiphospholipid syndrome. Negative prothrombin gene mutation, negative factor V Leiden mutation.  ANCA is negative. Clinically he is doing very well on Coumadin.  Recommend patient to continue Coumadin, goal of INR 2-3 continue follow-up with primary care provider. Other options include DOACs.  # Normocytic anemia Anemia, 01/28/2022 hemoglobin is stable.  Probably due to chronic diseases.  Observation.  Orders Placed This  Encounter  Procedures   CBC with Differential/Platelet    Standing Status:   Future    Standing Expiration Date:   05/04/2023   Comprehensive metabolic panel    Standing Status:   Future    Standing Expiration Date:   05/04/2023   Iron and TIBC    Standing Status:   Future    Standing Expiration Date:   05/04/2023   Ferritin    Standing Status:   Future    Standing Expiration Date:   05/04/2023   Retic Panel    Standing Status:   Future    Standing Expiration Date:   05/04/2023    All questions were answered. The patient knows to call the clinic with any problems questions or concerns.  cc Emogene Morgan, MD    Return of visit: 1 year  Rickard Patience, MD, PhD [facility] Hematology Oncology 05/03/2022

## 2022-05-05 ENCOUNTER — Ambulatory Visit: Payer: Medicare Other | Admitting: Physical Therapy

## 2022-05-05 ENCOUNTER — Encounter: Payer: Self-pay | Admitting: Physical Therapy

## 2022-05-05 ENCOUNTER — Encounter: Payer: Medicare Other | Admitting: Speech Pathology

## 2022-05-05 ENCOUNTER — Ambulatory Visit: Payer: Medicare Other | Attending: Physical Medicine and Rehabilitation | Admitting: Occupational Therapy

## 2022-05-05 ENCOUNTER — Encounter: Payer: Self-pay | Admitting: Occupational Therapy

## 2022-05-05 ENCOUNTER — Ambulatory Visit: Payer: Medicare Other | Admitting: Physical Medicine and Rehabilitation

## 2022-05-05 DIAGNOSIS — R2689 Other abnormalities of gait and mobility: Secondary | ICD-10-CM | POA: Diagnosis present

## 2022-05-05 DIAGNOSIS — R2681 Unsteadiness on feet: Secondary | ICD-10-CM | POA: Diagnosis present

## 2022-05-05 DIAGNOSIS — R278 Other lack of coordination: Secondary | ICD-10-CM | POA: Insufficient documentation

## 2022-05-05 DIAGNOSIS — R269 Unspecified abnormalities of gait and mobility: Secondary | ICD-10-CM | POA: Insufficient documentation

## 2022-05-05 DIAGNOSIS — M6281 Muscle weakness (generalized): Secondary | ICD-10-CM | POA: Insufficient documentation

## 2022-05-05 DIAGNOSIS — I63512 Cerebral infarction due to unspecified occlusion or stenosis of left middle cerebral artery: Secondary | ICD-10-CM | POA: Diagnosis present

## 2022-05-05 DIAGNOSIS — I63542 Cerebral infarction due to unspecified occlusion or stenosis of left cerebellar artery: Secondary | ICD-10-CM | POA: Insufficient documentation

## 2022-05-05 DIAGNOSIS — R262 Difficulty in walking, not elsewhere classified: Secondary | ICD-10-CM | POA: Insufficient documentation

## 2022-05-05 NOTE — Therapy (Signed)
OUTPATIENT OCCUPATIONAL THERAPY TREATMENT NOTE   Patient Name: Lindsey [name] MRN: [id number] DOB:1956-09-07, 66 y.o., female Today's Date: 05/05/2022   REFERRING PROVIDER: Tomasa Hose, MD   OT End of Session - 05/05/22 1401     Visit Number 63    Number of Visits 96    Date for OT Re-Evaluation 06/27/22    Authorization Type Progress report period starting 02/17/22    OT Start Time 1107    OT Stop Time 1145    OT Time Calculation (min) 38 min    Activity Tolerance Patient tolerated treatment well    Behavior During Therapy WFL for tasks assessed/performed               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 No pertinent surgical history. 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 ([place]) 09/24/2021   Cerebral embolism with cerebral infarction 09/23/2021   Pyelonephritis 09/19/2021   Pain due to onychomycosis of toenails of both feet 11/19/2020   Normocytic anemia 05/31/2020   Acute ischemic left MCA stroke ([place]) 04/30/2020   Right hemiplegia ([place]) 04/30/2020   Cerebrovascular accident ([place]) 04/21/2020   Atrial fibrillation with RVR ([place]) 04/21/2020   Essential hypertension 04/21/2020   Alcohol abuse 04/21/2020    RUE Measurements (Taken 04/04/22):   PROM Shoulder flexion:  7/03:  0(108)  5/18: 0(105)  PROM Shoulder abduction: 7/03: [id number]  5/18: 0(102)  PROM Elbow:   7/03: 0(0-140)  5/18: 0(0-136)  AROM Wrist extension:  7/03: [id number]  5/18: -40(72)  REFERRING DIAG: CVA  THERAPY DIAG:  Muscle weakness (generalized)  Rationale for Evaluation and Treatment Rehabilitation  PERTINENT HISTORY: Pt. is a 66 y.o. 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 Pt. has Right sided weakness, and receptive, and expressive aphasia. Pt. has had a previous CVA with right sided weakness in July 2021. PMHx includes: AFib, Renal Artery thrombosis, situational depression, HTN, Pyelnephritis, Seizure activity, COnstipation, AKI. Vitamin D deficiency, and urinary incontinence/  PRECAUTIONS: Fall   SUBJECTIVE: Pt.  Reports no pain today.    PAIN:  Are you having pain? No     OBJECTIVE:   TODAY'S TREATMENT:   Rationale for Evaluation and Treatment Rehabilitation  OT TREATMENT    Manual Therapy:   Pt. tolerated soft tissue massage to the right scapular musculature. Pt. tolerated scapular mobilizations in elevation, depression, abduction, and rotation following moist heat modality to the right shoulder. Pt. tolerated soft tissue mobilizations for radius on ulna, and metacarpal spread stretches to decrease tightness, and prepare the UE, and hand for ROM and therapeutic Ex.Manual therapy was performed independent of, and in preparation for there. Ex.    Therapeutic Exercise:   Pt. tolerated  PROM through all joint ranges of the RUE, and hand following moist heat modality, and manual therapy. Pt. worked on reps of active right wrist extension, and right supination with support proximally. Pt. education was provided about opportunities to engage the RUE during daily tasks.   Self-care:  Pt. performed UE dressing skills with ModA initiating doffing a jacket off the LUE, and MinA donning an open front  jacket using one-armed dressing techniques. Pt. required assist, and cues for using her LUE to reach around the back of her neck to grasp the jacket collar. Pt. worked on stabilizing bottles with the right hand, while opening them with the left hand at the tabletop.   Pt. required ModA and cues to doff a fitted jacket, and minA donning it. Pt. required the most assist doffing the jacket over the LUE, as well as when donning the jacket around her back. Pt. was able to place, and stabilize thinner bottles in the right hand while opening the bottles with the left hand. Pt. continues to present with flexor tone, and tightness through the RUE, wrist, and digits, however was able able to achieve increased PROM in the right shoulder, and active ROM for reps of wrist extension. Pt. continues to respond well to the session with less tone, tightness, and increased ROM noted throughout the RUE following ROM, and manual therapy. Pt. presented with increased tightness with right shoulder abduction, and flexion. Pt. continues to work on normalizing tone, and facilitating active volitional movement in the RUE, and hand to improve engagement of the RUE during ADLs, as well as improve transfers, and functional mobility for toileting.         PATIENT EDUCATION: Education details: dressing techniques, HEP Person educated: Patient Education method: Medical illustrator Education comprehension: verbalized understanding, returned demonstration, verbal cues required, and tactile cues required   HOME EXERCISE PROGRAM   Continue with ongoing HEPs for ROM to the RUE, hand, and digits.    OT Short Term Goals -       OT SHORT TERM GOAL #1   Title Pt and caregiver will be independent with HEP for RUE    Baseline Eval: No current program. 10th visit: Caregivers  report being comfortable with exercises at home. 01/06/2022: Continue ongoing HEPs for UE. 30th visit: Pt./caregivers to continue with ROM; 40th:  pt/caregivers to continue with PROM throughout RUE   Time 6   Period Weeks    Status On-going    Target Date 05/16/2022             OT Long Term Goals -       OT LONG TERM GOAL #1   Title Pt. will engage the RUE as a gross assist/atabilizer during ADLs 100% of the time    Baseline 04/04/2022: Pt. Is using her right hand to hold the loofa, and stabilize items.30th visit: Pt. is initiating using her RUE as a gross stabilizer with maxA 01/06/2022: Pt. is starting to engage her RUE as a gross stabiliizer with increased cues. Eval: pt. does not currently engage her RUE during ADLs. 10th visit: Pt. continues to need to work towards using her RUE as a gross stabilizer.    Time 12    Status On-going    Target Date 06/27/22      OT LONG TERM GOAL #2   Title Pt. will perform UE dressing using one armed dressing techniques    Baseline Eval: MaxA, 10th visit: ModA 01/06/2022: min-modA 30th visit; min0-modA, 04/04/2022: minA donning  loose shirts, MaxA tighter items/sports bra, independent doffing a shirt and bra    Time 12    Period Weeks    Status On-going    Target Date 06/27/22      OT LONG TERM GOAL #3   Title Pt. will require minA to stand and hike pants    Baseline Eval: MaxA, 10th visit: [place] 01/06/2022: [place] 30th visit: [place] 04/04/2022: independent with elastic pants if above the knees. MaxA if they fall below the knee; 04/07/22: min guard-min A to reach below knees to simulate hiking pants   Time 12    Period Weeks    Status On-going    Target Date 06/27/22      OT LONG TERM GOAL #4   Title Pt. will perform LE dressing with minA    Baseline Eval: MaxA 10th visit: Pt. requires MaxA for donning her brace, shoes, and socks. ModA with pants. 01/06/2022: ModA pants, MaxA shoes, socks, and brace 30th visit: ModA pants, MaxA shoes, socks, and brace. 04/04/2022: MaxA; 04/07/22: pt can don/doff L sock and shoe with supv, dep for RLE; see above for hiking pants   Time 12    Period Weeks    Status  On-going    Target Date 06/27/22      OT LONG TERM GOAL #5   Title Pt. will improve FOTO score by 2 pt. to reflect funcational change    Baseline Eval: FOTO score: 12, 10th visit: FOTO score 27 01/06/2022: 27 30th visit TBA at next visit secondary to no family available during the session; 04/04/22: FOTO 40   Time 12    Period Weeks    Status On-going    Target Date 06/27/22      OT LONG TERM GOAL #6   Title Patient will transfer to bedside commode or toilet with supervision.    Baseline 30th visit: min-modA. 01/06/2022: min-ModA 04/04/2022: Min-ModA   Time 4    Period Weeks    Status On-going    Target Date 06/27/22        OT LONG TERM GOAL #7                                                      Title: Pt. Will improve AROM wrist extension by 5 degrees in preparation for improving functional  reaching.                                                                                  Baseline:       7/03: -28(72)            Time: 4           Period: Weeks           Status: Ongoing           Target Date: 06/27/2022   Plan - 03/24/22 1429     Clinical Impression Statement Pt. required ModA and cues to doff a fitted jacket, and minA donning it. Pt. required the most  assist doffing the jacket over the LUE, as well as when donning the jacket around her back. Pt. was able to place, and stabilize thinner bottles in the right hand while opening the bottles with the left hand. Pt. continues to present with flexor tone, and tightness through the RUE, wrist, and digits, however was able able to achieve increased PROM in the right shoulder, and active ROM for reps of wrist extension. Pt. continues to respond well to the session with less tone, tightness, and increased ROM noted throughout the RUE following ROM, and manual therapy. Pt. presented with increased tightness with right shoulder abduction, and flexion. Pt. continues to work on normalizing tone, and facilitating active volitional movement in the  RUE, and hand to improve engagement of the RUE during ADLs, as well as improve transfers, and functional mobility for toileting.                     OT Occupational Profile and History Detailed Assessment- Review of Records and additional review of physical, cognitive, psychosocial history related to current functional performance    Occupational performance deficits (Please refer to evaluation for details): Leisure;IADL's;ADL's    Body Structure / Function / Physical Skills ADL;IADL;FMC;ROM;UE functional use;Strength;Decreased knowledge of use of DME;Coordination    Rehab Potential Fair    Clinical Decision Making Several treatment options, min-mod task modification necessary    Comorbidities Affecting Occupational Performance: May have comorbidities impacting occupational performance    Modification or Assistance to Complete Evaluation  Min-Moderate modification of tasks or assist with assess necessary to complete eval    OT Frequency 2x / week    OT Duration 12 weeks    OT Treatment/Interventions Self-care/ADL training;Neuromuscular education;Electrical Stimulation;Patient/family education;Therapeutic activities;Passive range of motion;Building services engineer;Therapeutic exercise;DME and/or AE instruction;Moist Heat    Consulted and Agree with Plan of Care Patient             Olegario Messier, MS, OTR/L   Olegario Messier, OT 05/05/2022, 2:05 PM

## 2022-05-05 NOTE — Addendum Note (Signed)
Addended by: Thresa Ross B on: 05/05/2022 01:45 PM   Modules accepted: Orders

## 2022-05-05 NOTE — Therapy (Signed)
OUTPATIENT PHYSICAL THERAPY TREATMENT NOTE/ RECERT    Patient Name: Lindsey [name] MRN: [id number] DOB:Oct 25, 1955, 66 y.o., female Today's Date: 05/05/2022  PCP: Dr. Tomasa Hose MD REFERRING PROVIDER: Leeroy Cha MD   PT End of Session - 05/05/22 1145     Visit Number 38    Number of Visits 41    Date for PT Re-Evaluation 05/03/22    Authorization Type Medicare    Authorization Time Period Cert through 12/08/16; Recert 11/11/[id number]/11/6788    Progress Note Due on Visit 40    PT Start Time 1146    PT Stop Time 1225    PT Time Calculation (min) 39 min    Equipment Utilized During Treatment Gait belt    Activity Tolerance Patient limited by fatigue;No increased pain    Behavior During Therapy WFL for tasks assessed/performed                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 No pertinent surgical history. 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 ([place]) 09/24/2021   Cerebral embolism with cerebral infarction 09/23/2021   Pyelonephritis 09/19/2021   Pain due to onychomycosis of toenails of both feet 11/19/2020   Normocytic anemia 05/31/2020   Acute ischemic left MCA stroke ([place]) 04/30/2020   Right hemiplegia ([place]) 04/30/2020   Cerebrovascular accident ([place]) 04/21/2020   Atrial  fibrillation with RVR ([place]) 04/21/2020   Essential hypertension 04/21/2020   Alcohol abuse 04/21/2020    REFERRING DIAG: s/p CVA  THERAPY DIAG:  Unsteadiness on feet  Abnormality of gait and mobility  Difficulty in walking, not elsewhere classified  Other abnormalities of gait and mobility  Rationale for Evaluation and Treatment Rehabilitation  PERTINENT HISTORY: Pt was doing very well and was walking short distances wqith a cane prior to second stroke in december. At this point she is limited to wheelchair as her main means of mobility. Pt caregiver reports all detailed information. Pt has hesitation with the left lower extremity and significant weakness on the right lower extremity. Pt will take 2-3 steps when transfering. Pt caregiver assists with all transfers.Pt has shower with ledge to steps over ledge when entering and utilizes bedside commode. Pt has 2 level home with chair lift for upstairs access. Pt would like to improve ransfers, improve neglect on her R side and improve her overall strength and mobility.Pt. is a 66 y.o. 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 Pt. has Right sided weakness, and receptive, and expressive aphasia. Pt. has had a previous CVA with right sided weakness in July 2021. PMHx includes: AFib, Renal Artery thrombosis, situational depression, HTN, Pyelnephritis, Seizure activity, COnstipation, AKI. Vitamin D deficiency, and urinary incontinence.  PRECAUTIONS: fall  risk, wearing RLE AFO  SUBJECTIVE: Pt reports doing well. No significant changes since last session. No pain today, no medication updates.    PAIN:  Are you having pain? No      TODAY'S TREATMENT:  05/05/22 Physical therapy treatment session today consisted of completing assessment of goals and administration of testing as demonstrated in flow sheet and goals section of this exam. Addition treatments may be found  below.     INTERVENTIONS:  5XSTS 52.45 sec with controled 5X STS with full erect posture each rep, able to let go of chair and obtain full erect posture and let go of chair arm and maintain balance on a few reps.    [facility] PT Assessment - 05/05/22 0001       Berg Balance Test   Sit to Stand Able to stand using hands after several tries (P)     Standing Unsupported Able to stand 2 minutes with supervision (P)     Sitting with Back Unsupported but Feet Supported on Floor or Stool Able to sit safely and securely 2 minutes (P)     Stand to Sit Controls descent by using hands (P)     Transfers Needs one person to assist (P)     Standing Unsupported with Eyes Closed Able to stand 10 seconds with supervision (P)     Standing Unsupported with Feet Together Needs help to attain position and unable to hold for 15 seconds (P)     From Standing, Reach Forward with Outstretched Arm Reaches forward but needs supervision (P)     From Standing Position, Pick up Object from Floor Unable to try/needs assist to keep balance (P)     From Standing Position, Turn to Look Behind Over each Shoulder Needs supervision when turning (P)     Turn 360 Degrees Needs assistance while turning (P)    ale to copmlte without UE assit but min A from PT for balance throughout and cues for sequencing   Standing Unsupported, Alternately Place Feet on Step/Stool Needs assistance to keep from falling or unable to try (P)     Standing Unsupported, One Foot in Front Needs help to step but can hold 15 seconds (P)     Standing on One Leg Unable to try or needs assist to prevent fall (P)     Total Score 19 (P)                Education provided throughout session via VC/TC and demonstration to facilitate movement at target joints and correct muscle activation for all testing and exercises performed.     PATIENT EDUCATION: Education details: Pt educated throughout session about proper posture and technique with exercises.  Improved exercise technique, movement at target joints, use of target muscles after min to mod verbal, visual, tactile cues.  Person educated: Patient Education method: Explanation, Demonstration, and Verbal cues Education comprehension: verbalized understanding, returned demonstration, and needs further education   HOME EXERCISE PROGRAM: Continue as previously given;    PT Short Term Goals -       PT SHORT TERM GOAL #1   Title Patient will be independent in home exercise program to improve strength/mobility for better functional independence with ADLs.    Baseline no formal HEp for LE/balance at this time; 02/08/2022=Patient continues to require VC for reminders and assist with hip march/knee ext (seated for strengthening); Reminders to continue to work on static stand at home.    Time 4    Period Weeks  Status Partially Met  04/07/22: complete somewhat regularly     Target Date 12/14/21              PT Long Term Goals -       PT LONG TERM GOAL #1   Title Patient will increase FOTO score to equal to or greater than   41  to demonstrate statistically significant improvement in mobility and quality of life.    Baseline 16 on 2/14; 02/08/2022=Patient unable to complete today- No family present but will attempt again next visit available with family present to assist in completion; 02/17/2022= 40    Time 12    Period Weeks    Status On-going    Target Date 07/28/2022       PT LONG TERM GOAL #3   Title Patient  will complete five times sit to stand test in < 30 seconds with UE assistance indicating an increased LE strength and improved balance.    Baseline 58.47 s with UE assist and with A from PT for R LE positioning; 02/08/2022=54 sec with use of L UE support and Min physical assist to position right LE.  7/6: 30.5 sec, min A for LE positioning, some attempts did not come to full erect posture, did not let go with L UE from armchair with any attempt 05/05/22: 26.65 sec    Time 12     Period Weeks    Status Met    Target Date 05/03/22      PT LONG TERM GOAL #4   Title Patient will increase Berg Balance score by > 6 points to demonstrate decreased fall risk during functional activities.    Baseline 12 on 2/14; 02/08/2022= 13/56  04/07/22: 17  8/3: 19    Time 12    Period Weeks    Status Met    Target Date 05/03/22      PT LONG TERM GOAL #5   Title Pt will transfer from seated position on table/chair to/from transport chair/WC in order to indicate improved transfer ability.    Baseline Requires Min A for LE positioning and to prevent LOB. 02/08/2022=Patient attempted to perform on her own- Unable to push herself up from chair- Very min assist to position right LE into more flexed position and VC for hand placement. Patient able to stand with min Assist with use of gait belt and VC to lean; 02/17/2022=Patient was able to transfer from transport w/c with some physical assist with Right to swing to pivot to side of chair. She was able to sit to stand today with only CGA and then stand pivot with right arm holding onto arm of chair. Only difficulty was stand to sit- Max VC not to plop into chair. 04/07/22: Pt performed movement of B LE to side of chair independently with chair transfer. Pt required min A with R LE foot placement of floor. Pt requires increased time to complete but complete with CGA only for safety and no cues or A to prevent falling/ plopping into armchair.  8/3: Pt requires min A for maintenance of standig balance, able to navigate from transport chairr to arm chair with hemiwalker, does not controll descent with ahands still " flops" into chair    Time 12    Period Weeks    Status Partially met    Target Date 07/28/2022       PT LONG TERM GOAL #6   Title Patient will ambulate > 150 feet with CGA using Hemiwalker on level  surfaces for improved household and short community distances.    Baseline 02/08/2022= 40 feet with use of HW, heavy CGA with max VC for gait  sequencing and w/c follow. 5/18 = 85 feet with use of HW, close CGA  7/6: 45 feet, heavy use of hemiwalker 8/3: 85 feet with heavy hemi walker use, no Lob throughout, cues for appropriate weight distribution    Time 12    Period Weeks    Status On-going    Target Date 07/28/2022        PT LONG TERM GOAL #7   Title Pt will increase 10MWT by at least 0.13 m/s in order to demonstrate clinically significant improvement in community ambulation.    Baseline 02/08/2022= 0.05 m/s using HW 7/6: .029ms with heavy ue use of hemiwalker and CGA for balance 8/3: .0537m   Time 12    Period Weeks    Status Ongoing     Target Date 07/28/2022      8.  Pt will improve 5X STS with full knee extension in standing each rep and with appropriate eccentric control in order to indicate improved transfer efficacy and improved LE strength  Baseline: 52.45 sec with UE support Goal status: INITIAL  9.  Patient will increase Berg Balance score by > 6 points to demonstrate decreased fall risk during functional activities Baseline: 19 Goal status: INITIAL           Plan -     Clinical Impression Statement Pt presents to PT for recert note this date. Pt shows improvement in ambulation distance indicating improvement in mobility but still requiring heavy UE A with hemiwalker and cues from PT for appropriate LE weight distribution. Also, pt distance and independence is still not appropriate for independent household ambulation. Pt also shows improvement in 5X STS meeting initial goal for this activity but pt shows significant unsteadiness with task due to attempting to rush through for time. Pt tested for quality 5X STS and new goal created based on new criteria to improve safety and control with sit to stand transfers. Pt making progress toward her goals at this time. Pt will continue to benefit from skilled physical therapy intervention to address impairments, improve QOL, and attain therapy goals. Patient's  condition has the potential to improve in response to therapy. Maximum improvement is yet to be obtained. The anticipated improvement is attainable and reasonable in a generally predictable time.      Personal Factors and Comorbidities Age;Comorbidity 1;Comorbidity 2;Comorbidity 3+;Time since onset of injury/illness/exacerbation;Other    Comorbidities AFib, Renal Artery thrombosis, situational depression, HTN, Pyelnephritis, Seizure activity, COnstipation, AKI    Examination-Activity Limitations Bathing;Bed Mobility;Carry;Locomotion Level;Dressing;Sit;Squat;Stairs;Stand;Toileting;Transfers    Examination-Participation Restrictions Church;Community Activity;Laundry    Stability/Clinical Decision Making Evolving/Moderate complexity    Rehab Potential Fair    PT Frequency 2x / week    PT Duration 12 weeks    PT Treatment/Interventions ADLs/Self Care Home Management;Gait training;Stair training;Therapeutic activities;Therapeutic exercise;Balance training;Neuromuscular re-education;Patient/family education;Wheelchair mobility training;Manual techniques;Energy conservation;Dry needling;Passive range of motion;Taping;Spinal Manipulations;Joint Manipulations    PT Next Visit Plan Gait training, Transfer training, LE strengthening as appropriate.    PT Home Exercise Plan HEP: general LE and postural exercises    Consulted and Agree with Plan of Care Patient            ChParticia LatherT 12:54 PM, 05/05/22

## 2022-05-09 ENCOUNTER — Ambulatory Visit: Payer: Medicare Other

## 2022-05-09 ENCOUNTER — Ambulatory Visit: Payer: Medicare Other | Admitting: Occupational Therapy

## 2022-05-12 ENCOUNTER — Ambulatory Visit: Payer: Medicare Other | Admitting: Occupational Therapy

## 2022-05-12 ENCOUNTER — Ambulatory Visit: Payer: Medicare Other

## 2022-05-12 ENCOUNTER — Ambulatory Visit: Payer: Medicare Other | Admitting: Podiatry

## 2022-05-12 DIAGNOSIS — M6281 Muscle weakness (generalized): Secondary | ICD-10-CM | POA: Diagnosis not present

## 2022-05-12 DIAGNOSIS — R269 Unspecified abnormalities of gait and mobility: Secondary | ICD-10-CM

## 2022-05-12 DIAGNOSIS — R262 Difficulty in walking, not elsewhere classified: Secondary | ICD-10-CM

## 2022-05-12 DIAGNOSIS — R2681 Unsteadiness on feet: Secondary | ICD-10-CM

## 2022-05-12 DIAGNOSIS — R2689 Other abnormalities of gait and mobility: Secondary | ICD-10-CM

## 2022-05-12 NOTE — Therapy (Signed)
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 ([place]) 09/24/2021   Cerebral embolism with cerebral infarction 09/23/2021   Pyelonephritis 09/19/2021   Pain due to onychomycosis of toenails of both feet 11/19/2020   Normocytic anemia 05/31/2020   Acute ischemic left MCA stroke ([place]) 04/30/2020   Right hemiplegia ([place]) 04/30/2020   Cerebrovascular accident ([place]) 04/21/2020   Atrial fibrillation with RVR ([place]) 04/21/2020   Essential hypertension 04/21/2020   Alcohol abuse 04/21/2020    RUE Measurements (Taken 04/04/22):   PROM Shoulder flexion:  7/03: 0(108)  5/18: 0(105)  PROM Shoulder abduction: 7/03: [id number]  5/18:  0(102)  PROM Elbow:   7/03: 0(0-140)  5/18: 0(0-136)  AROM Wrist extension:  7/03: [id number])  5/18: -40(72)  REFERRING DIAG: CVA  THERAPY DIAG:  Muscle weakness (generalized)  Rationale for Evaluation and Treatment Rehabilitation  PERTINENT HISTORY: Pt. is a 66 y.o. 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 Pt. has Right sided weakness, and receptive, and expressive aphasia. Pt. has had a previous CVA with right sided weakness in July 2021. PMHx includes: AFib, Renal Artery thrombosis, situational depression, HTN, Pyelnephritis, Seizure activity, COnstipation, AKI. Vitamin D deficiency, and urinary incontinence/  PRECAUTIONS: Fall   SUBJECTIVE: Pt.  Reports no pain today.    PAIN:  Are you having pain? No     OBJECTIVE:   TODAY'S TREATMENT:   Rationale for Evaluation and Treatment Rehabilitation  OT TREATMENT    Manual Therapy:   Pt. tolerated soft tissue massage to the right scapular musculature. Pt. tolerated scapular mobilizations in elevation, depression, abduction, and rotation following moist heat modality to the right shoulder. Pt. tolerated soft tissue mobilizations for radius on ulna, and metacarpal spread stretches to decrease tightness, and prepare the UE, and hand for ROM and therapeutic Ex.Manual therapy was performed independent of, and in preparation for there. Ex.    Therapeutic Exercise:   Pt. tolerated PROM through all joint ranges of the RUE, and hand  following moist heat modality, and manual therapy. Pt. worked on reps of active right wrist extension, and right supination with support proximally. Pt. education was provided about opportunities to engage the RUE during daily tasks.    Pt. education was provided about soft tissue massage to the right forearm. Pt. continues to present with flexor tone, and tightness through the RUE, wrist, and digits, however was able able to  achieve increased PROM in the right shoulder, and active ROM for reps of wrist extension. Pt. continues to respond well to the session with less tone, tightness, and increased ROM noted throughout the RUE following ROM, and manual therapy. Pt. presented with increased tightness with right shoulder abduction, and flexion. Pt. continues to work on normalizing tone, and facilitating active volitional movement in the RUE, and hand to improve engagement of the RUE during ADLs, as well as improve transfers, and functional mobility for toileting.         PATIENT EDUCATION: Education details: dressing techniques, HEP Person educated: Patient Education method: Medical illustrator Education comprehension: verbalized understanding, returned demonstration, verbal cues required, and tactile cues required   HOME EXERCISE PROGRAM   Continue with ongoing HEPs for ROM to the RUE, hand, and digits.    OT Short Term Goals -       OT SHORT TERM GOAL #1   Title Pt and caregiver will be independent with HEP for RUE    Baseline Eval: No current program. 10th visit: Caregivers  report being comfortable with exercises at home. 01/06/2022: Continue ongoing HEPs for UE. 30th visit: Pt./caregivers to continue with ROM; 40th: pt/caregivers to continue with PROM throughout RUE   Time 6   Period Weeks    Status On-going    Target Date 05/16/2022             OT Long Term Goals -       OT LONG TERM GOAL #1   Title Pt. will engage the RUE as a gross assist/atabilizer during ADLs 100% of the time    Baseline 04/04/2022: Pt. Is using her right hand to hold the loofa, and stabilize items.30th visit: Pt. is initiating using her RUE as a gross stabilizer with maxA 01/06/2022: Pt. is starting to engage her RUE as a gross stabiliizer with increased cues. Eval: pt. does not currently engage her RUE during ADLs. 10th visit: Pt. continues to need to work towards using her RUE as a gross stabilizer.    Time 12     Status On-going    Target Date 06/27/22      OT LONG TERM GOAL #2   Title Pt. will perform UE dressing using one armed dressing techniques    Baseline Eval: MaxA, 10th visit: ModA 01/06/2022: min-modA 30th visit; min0-modA, 04/04/2022: minA donning loose shirts, MaxA tighter items/sports bra, independent doffing a shirt and bra    Time 12    Period Weeks    Status On-going    Target Date 06/27/22      OT LONG TERM GOAL #3   Title Pt. will require minA to stand and hike pants    Baseline Eval: MaxA, 10th visit: ModA 01/06/2022: ModA 30th visit: ModA 04/04/2022: independent with elastic pants if above the knees. MaxA if they fall below the knee; 04/07/22: min guard-min A to reach below knees to simulate hiking pants   Time 12    Period Weeks    Status On-going    Target Date 06/27/22      OT  LONG TERM GOAL #4   Title Pt. will perform LE dressing with minA    Baseline Eval: MaxA 10th visit: Pt. requires MaxA for donning her brace, shoes, and socks. ModA with pants. 01/06/2022: ModA pants, MaxA shoes, socks, and brace 30th visit: ModA pants, MaxA shoes, socks, and brace. 04/04/2022: MaxA; 04/07/22: pt can don/doff L sock and shoe with supv, dep for RLE; see above for hiking pants   Time 12    Period Weeks    Status On-going    Target Date 06/27/22      OT LONG TERM GOAL #5   Title Pt. will improve FOTO score by 2 pt. to reflect funcational change    Baseline Eval: FOTO score: 12, 10th visit: FOTO score 27 01/06/2022: 27 30th visit TBA at next visit secondary to no family available during the session; 04/04/22: FOTO 40   Time 12    Period Weeks    Status On-going    Target Date 06/27/22      OT LONG TERM GOAL #6   Title Patient will transfer to bedside commode or toilet with supervision.    Baseline 30th visit: min-modA. 01/06/2022: min-ModA 04/04/2022: Min-ModA   Time 4    Period Weeks    Status On-going    Target Date 06/27/22        OT LONG TERM GOAL #7                                                       Title: Pt. Will improve AROM wrist extension by 5 degrees in preparation for improving functional  reaching.                                                                                  Baseline:       7/03: -28(72)            Time: 4           Period: Weeks           Status: Ongoing           Target Date: 06/27/2022   Plan - 03/24/22 1429     Clinical Impression Statement Pt. education was provided about soft tissue massage to the right forearm. Pt. continues to present with flexor tone, and tightness through the RUE, wrist, and digits, however was able able to achieve increased PROM in the right shoulder, and active ROM for reps of wrist extension. Pt. continues to respond well to the session with less tone, tightness, and increased ROM noted throughout the RUE following ROM, and manual therapy. Pt. presented with increased tightness with right shoulder abduction, and flexion. Pt. continues to work on normalizing tone, and facilitating active volitional movement in the RUE, and hand to improve engagement of the RUE during ADLs, as well as improve transfers, and functional mobility for toileting.                    OT Occupational Profile and History  Detailed Assessment- Review of Records and additional review of physical, cognitive, psychosocial history related to current functional performance    Occupational performance deficits (Please refer to evaluation for details): Leisure;IADL's;ADL's    Body Structure / Function / Physical Skills ADL;IADL;FMC;ROM;UE functional use;Strength;Decreased knowledge of use of DME;Coordination    Rehab Potential Fair    Clinical Decision Making Several treatment options, min-mod task modification necessary    Comorbidities Affecting Occupational Performance: May have comorbidities impacting occupational performance    Modification or Assistance to Complete Evaluation  Min-Moderate modification of tasks or assist with assess necessary to  complete eval    OT Frequency 2x / week    OT Duration 12 weeks    OT Treatment/Interventions Self-care/ADL training;Neuromuscular education;Electrical Stimulation;Patient/family education;Therapeutic activities;Passive range of motion;Building services engineer;Therapeutic exercise;DME and/or AE instruction;Moist Heat    Consulted and Agree with Plan of Care Patient             Olegario Messier, MS, OTR/L   Olegario Messier, OT 05/12/2022, 7:06 PM

## 2022-05-12 NOTE — Therapy (Signed)
OUTPATIENT PHYSICAL THERAPY TREATMENT NOTE/ RECERT    Patient Name: Lindsey [name] MRN: [id number] DOB:[birth year], 66 y.o., female Today's Date: 05/12/2022  PCP: Dr. Tomasa Hose MD REFERRING PROVIDER: Leeroy Cha MD   PT End of Session - 05/12/22 1018     Visit Number 39    Number of Visits 41    Date for PT Re-Evaluation 07/28/22    Authorization Type Medicare    Authorization Time Period Cert through 11/11/39; Recert 12/03/4399-0/11/7251    Progress Note Due on Visit 40    PT Start Time 1018    PT Stop Time 1100    PT Time Calculation (min) 42 min    Equipment Utilized During Treatment Gait belt    Activity Tolerance Patient limited by fatigue;No increased pain    Behavior During Therapy WFL for tasks assessed/performed                 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 No pertinent surgical history. 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 ([place]) 09/24/2021   Cerebral embolism with cerebral infarction 09/23/2021   Pyelonephritis 09/19/2021   Pain due to onychomycosis of toenails of both feet 11/19/2020   Normocytic anemia 05/31/2020   Acute ischemic left MCA stroke ([place]) 04/30/2020   Right hemiplegia ([place]) 04/30/2020   Cerebrovascular accident ([place]) 04/21/2020   Atrial  fibrillation with RVR ([place]) 04/21/2020   Essential hypertension 04/21/2020   Alcohol abuse 04/21/2020    REFERRING DIAG: s/p CVA  THERAPY DIAG:  Difficulty in walking, not elsewhere classified  Muscle weakness (generalized)  Unsteadiness on feet  Abnormality of gait and mobility  Other abnormalities of gait and mobility  Rationale for Evaluation and Treatment Rehabilitation  PERTINENT HISTORY: Pt was doing very well and was walking short distances wqith a cane prior to second stroke in december. At this point she is limited to wheelchair as her main means of mobility. Pt caregiver reports all detailed information. Pt has hesitation with the left lower extremity and significant weakness on the right lower extremity. Pt will take 2-3 steps when transfering. Pt caregiver assists with all transfers.Pt has shower with ledge to steps over ledge when entering and utilizes bedside commode. Pt has 2 level home with chair lift for upstairs access. Pt would like to improve ransfers, improve neglect on her R side and improve her overall strength and mobility.Pt. is a 66 y.o. 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 Pt. has Right sided weakness, and receptive, and expressive aphasia. Pt. has had a previous CVA with right sided weakness in July 2021. PMHx includes: AFib, Renal Artery thrombosis, situational depression, HTN, Pyelnephritis, Seizure activity, COnstipation, AKI. Vitamin D deficiency, and  urinary incontinence.   PRECAUTIONS: fall risk, wearing RLE AFO  SUBJECTIVE: No falls reported since last visit.  Pt notes she has nothing planned for this upcoming weekend as well.   PAIN:  Are you having pain? No      TODAY'S TREATMENT:  05/12/22   INTERVENTIONS:  Therapeutic Exercise:  Seated    Hamstring curl RTB  2x 10 with AAROM to full R knee extension    Red tband around BLE: hip abduction with therapist  providing visual cues for increased AROM in RLE x15 reps BLE;   Rainbow ball hip adduction x15 reps BLE   Pt required verbal cues for correct exercise technique. She often compensates for LE weakness with increased trunk movement to facilitate increased LE ROM. She reports moderate fatigue after exercise;     Therapeutic activity:   Instructed patient in stand pivot transfer mat table to arm chair x3 sets with min A and mod VCs for hand placement;    Performed STS from arm chair x 5 - Pt required min A and mod VCs for positioning/sequencing;    Pt requires max VCS for sequencing for safety including LE positioning and to reach back to chair table with UE;    Pt participated in performing turns in standing in order assist with transfers and sequencing  Gait Training:  Pt ambulated 64 feet with hemiwalker in LUE, requiring min A for safety; She ambulates with narrow base of support, uneven step length with step to gait pattern; She required mod VCs for walker placement/positioning, mod VCs to increase base of support of LE with increasing RLE hip abduction, increasing step length for better gait safety;    Pt continues to have difficulty with sequencing of tasks such as sit to stand and turning while standing, working on specific interventions to target parts of this movement.    Education provided throughout session via VC/TC and demonstration to facilitate movement at target joints and correct muscle activation for all testing and exercises performed.     PATIENT EDUCATION: Education details: Pt educated throughout session about proper posture and technique with exercises. Improved exercise technique, movement at target joints, use of target muscles after min to mod verbal, visual, tactile cues.  Person educated: Patient Education method: Explanation, Demonstration, and Verbal cues Education comprehension: verbalized understanding, returned demonstration, and needs further  education   HOME EXERCISE PROGRAM: Continue as previously given;    PT Short Term Goals -       PT SHORT TERM GOAL #1   Title Patient will be independent in home exercise program to improve strength/mobility for better functional independence with ADLs.    Baseline no formal HEp for LE/balance at this time; 02/08/2022=Patient continues to require VC for reminders and assist with hip march/knee ext (seated for strengthening); Reminders to continue to work on static stand at home.    Time 4    Period Weeks    Status Partially Met  04/07/22: complete somewhat regularly     Target Date 12/14/21              PT Long Term Goals -       PT LONG TERM GOAL #1   Title Patient will increase FOTO score to equal to or greater than   41  to demonstrate statistically significant improvement in mobility and quality of life.    Baseline 16 on 2/14; 02/08/2022=Patient unable to complete today- No family present but will attempt again next visit available with family  present to assist in completion; 02/17/2022= 40    Time 12    Period Weeks    Status On-going    Target Date 07/28/2022       PT LONG TERM GOAL #3   Title Patient  will complete five times sit to stand test in < 30 seconds with UE assistance indicating an increased LE strength and improved balance.    Baseline 58.47 s with UE assist and with A from PT for R LE positioning; 02/08/2022=54 sec with use of L UE support and Min physical assist to position right LE.  7/6: 30.5 sec, min A for LE positioning, some attempts did not come to full erect posture, did not let go with L UE from armchair with any attempt 05/05/22: 26.65 sec    Time 12    Period Weeks    Status Met    Target Date 05/03/22      PT LONG TERM GOAL #4   Title Patient will increase Berg Balance score by > 6 points to demonstrate decreased fall risk during functional activities.    Baseline 12 on 2/14; 02/08/2022= 13/56  04/07/22: 17  8/3: 19    Time 12    Period Weeks     Status Met    Target Date 05/03/22      PT LONG TERM GOAL #5   Title Pt will transfer from seated position on table/chair to/from transport chair/WC in order to indicate improved transfer ability.    Baseline Requires Min A for LE positioning and to prevent LOB. 02/08/2022=Patient attempted to perform on her own- Unable to push herself up from chair- Very min assist to position right LE into more flexed position and VC for hand placement. Patient able to stand with min Assist with use of gait belt and VC to lean; 02/17/2022=Patient was able to transfer from transport w/c with some physical assist with Right to swing to pivot to side of chair. She was able to sit to stand today with only CGA and then stand pivot with right arm holding onto arm of chair. Only difficulty was stand to sit- Max VC not to plop into chair. 04/07/22: Pt performed movement of B LE to side of chair independently with chair transfer. Pt required min A with R LE foot placement of floor. Pt requires increased time to complete but complete with CGA only for safety and no cues or A to prevent falling/ plopping into armchair.  8/3: Pt requires min A for maintenance of standig balance, able to navigate from transport chairr to arm chair with hemiwalker, does not controll descent with ahands still " flops" into chair    Time 12    Period Weeks    Status Partially met    Target Date 07/28/2022       PT LONG TERM GOAL #6   Title Patient will ambulate > 150 feet with CGA using Hemiwalker on level surfaces for improved household and short community distances.    Baseline 02/08/2022= 40 feet with use of HW, heavy CGA with max VC for gait sequencing and w/c follow. 5/18 = 85 feet with use of HW, close CGA  7/6: 45 feet, heavy use of hemiwalker 8/3: 85 feet with heavy hemi walker use, no Lob throughout, cues for appropriate weight distribution    Time 12    Period Weeks    Status On-going    Target Date 07/28/2022        PT LONG TERM  GOAL #  7   Title Pt will increase 10MWT by at least 0.13 m/s in order to demonstrate clinically significant improvement in community ambulation.    Baseline 02/08/2022= 0.05 m/s using HW 7/6: .059ms with heavy ue use of hemiwalker and CGA for balance 8/3: .0562m   Time 12    Period Weeks    Status Ongoing     Target Date 07/28/2022      8.  Pt will improve 5X STS with full knee extension in standing each rep and with appropriate eccentric control in order to indicate improved transfer efficacy and improved LE strength  Baseline: 52.45 sec with UE support Goal status: INITIAL  9.  Patient will increase Berg Balance score by > 6 points to demonstrate decreased fall risk during functional activities Baseline: 19 Goal status: INITIAL           Plan -     Clinical Impression Statement Pt put forth good effort throughout treatment session, however needed more assistance with mobility than she has in the past treatment sessions.  Pt able to ambulate more, but has continued to struggle with turns and eccentric lowering at chair.  Pt tends to "plop" down and runs the risk of falling with unsafe transfer to sitting.  Pt will continue to benefit from skilled physical therapy intervention to address impairments, improve QOL, and attain therapy goals. Patient's condition has the potential to improve in response to therapy. Maximum improvement is yet to be obtained. The anticipated improvement is attainable and reasonable in a generally predictable time.      Personal Factors and Comorbidities Age;Comorbidity 1;Comorbidity 2;Comorbidity 3+;Time since onset of injury/illness/exacerbation;Other    Comorbidities AFib, Renal Artery thrombosis, situational depression, HTN, Pyelnephritis, Seizure activity, COnstipation, AKI    Examination-Activity Limitations Bathing;Bed Mobility;Carry;Locomotion Level;Dressing;Sit;Squat;Stairs;Stand;Toileting;Transfers    Examination-Participation Restrictions  Church;Community Activity;Laundry    Stability/Clinical Decision Making Evolving/Moderate complexity    Rehab Potential Fair    PT Frequency 2x / week    PT Duration 12 weeks    PT Treatment/Interventions ADLs/Self Care Home Management;Gait training;Stair training;Therapeutic activities;Therapeutic exercise;Balance training;Neuromuscular re-education;Patient/family education;Wheelchair mobility training;Manual techniques;Energy conservation;Dry needling;Passive range of motion;Taping;Spinal Manipulations;Joint Manipulations    PT Next Visit Plan Gait training, Transfer training, LE strengthening as appropriate.    PT Home Exercise Plan HEP: general LE and postural exercises    Consulted and Agree with Plan of Care Patient            JoGwenlyn SaranPT, DPT 05/12/22, 11:07 AM

## 2022-05-16 ENCOUNTER — Ambulatory Visit: Payer: Medicare Other | Admitting: Occupational Therapy

## 2022-05-16 ENCOUNTER — Ambulatory Visit: Payer: Medicare Other

## 2022-05-16 DIAGNOSIS — M6281 Muscle weakness (generalized): Secondary | ICD-10-CM

## 2022-05-16 DIAGNOSIS — R262 Difficulty in walking, not elsewhere classified: Secondary | ICD-10-CM

## 2022-05-16 DIAGNOSIS — R2689 Other abnormalities of gait and mobility: Secondary | ICD-10-CM

## 2022-05-16 DIAGNOSIS — R269 Unspecified abnormalities of gait and mobility: Secondary | ICD-10-CM

## 2022-05-16 DIAGNOSIS — I63542 Cerebral infarction due to unspecified occlusion or stenosis of left cerebellar artery: Secondary | ICD-10-CM

## 2022-05-16 DIAGNOSIS — R278 Other lack of coordination: Secondary | ICD-10-CM

## 2022-05-16 DIAGNOSIS — R2681 Unsteadiness on feet: Secondary | ICD-10-CM

## 2022-05-16 NOTE — Therapy (Signed)
OUTPATIENT PHYSICAL THERAPY TREATMENT NOTE/Physical Therapy Progress Note   Dates of reporting period  04/07/2022   to   05/16/2022    Patient Name: Lindsey [name] MRN: [id number] DOB:1955-12-12, 66 y.o., female Today's Date: 05/16/2022  PCP: Dr. Tomasa Hose MD REFERRING PROVIDER: Leeroy Cha MD   PT End of Session - 05/16/22 1117     Visit Number 40    Number of Visits 61    Date for PT Re-Evaluation 07/28/22    Authorization Type Medicare    Authorization Time Period Cert through 04/08/79; Recert 05/10/1102-10/07/9456; Recert 02/09/2923-46/28    Progress Note Due on Visit 50    PT Start Time 0932    PT Stop Time 1015    PT Time Calculation (min) 43 min    Equipment Utilized During Treatment Gait belt    Activity Tolerance Patient limited by fatigue;No increased pain    Behavior During Therapy WFL for tasks assessed/performed                  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 No pertinent surgical history. 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 ([place]) 09/24/2021   Cerebral embolism with cerebral infarction 09/23/2021   Pyelonephritis 09/19/2021   Pain due to onychomycosis of toenails of both feet 11/19/2020   Normocytic anemia 05/31/2020   Acute ischemic left MCA stroke  ([place]) 04/30/2020   Right hemiplegia ([place]) 04/30/2020   Cerebrovascular accident ([place]) 04/21/2020   Atrial fibrillation with RVR ([place]) 04/21/2020   Essential hypertension 04/21/2020   Alcohol abuse 04/21/2020    REFERRING DIAG: s/p CVA  THERAPY DIAG:  Muscle weakness (generalized)  Difficulty in walking, not elsewhere classified  Unsteadiness on feet  Abnormality of gait and mobility  Other abnormalities of gait and mobility  Other lack of coordination  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 At this point she is limited to wheelchair as her main means of mobility. Pt caregiver reports all detailed information. Pt has hesitation with the left lower extremity and significant weakness on the right lower extremity. Pt will take 2-3 steps when transfering. Pt caregiver assists with all transfers.Pt has shower with ledge to steps over ledge when entering and utilizes bedside commode. Pt has 2 level home with chair lift for upstairs access. Pt would like to improve ransfers, improve neglect on her R side and improve her overall strength and mobility.Pt. is a 66 y.o. 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 Pt. has Right  sided weakness, and receptive, and expressive aphasia. Pt. has had a previous CVA with right sided weakness in July 2021. PMHx includes: AFib, Renal Artery thrombosis, situational depression, HTN, Pyelnephritis, Seizure activity, COnstipation, AKI. Vitamin D deficiency, and urinary incontinence.   PRECAUTIONS: fall risk, wearing RLE AFO  SUBJECTIVE: Patient reports doing okay today- arrives with her sister from out of state who is visiting. She denies any pain or falls.     PAIN:  Are you having pain? No      TODAY'S TREATMENT:  05/16/22   INTERVENTIONS:   Progress note for visit #40 due: All goals just assessed and updated on 05/05/2022 during recert but did reassess gait distance and gait speed goal:    Therapeutic Exercise:  Seated:     Heel slides R   2x 12 using glider under foot for less traction to facilitate hamstring contraction.    Hip abd/add BLE with visual cues for feedback to initial more active motion of Right LE 2 sets of 10 reps   Hip march AAROM on right and 4lb. AW on left 2 sets of 12 reps  Pt required verbal cues for correct exercise technique. She often compensates for LE weakness with increased trunk movement and knee ext to facilitate increased Right hip flex LE ROM. She reports moderate fatigue after exercise;     Therapeutic activity:   Instructed patient in STS from arm chair x 12 reps. min A and mod VCs for hand placement;     Gait Training:  Pt ambulated 95 feet with hemiwalker in LUE, requiring min A and use of right AFO for safety; She continues to ambulate with narrow base of support-scissoring gait at times and Intermittent LOB- Uneven and inconsistent step length- ranging from step to gait to some level of reciprocal. Gait speed measured at 0.06 m/s as she required mod VCs for walker placement/positioning, mod VCs to increase base of support of LE while widening position of hemi-walker.      Education provided throughout session via VC/TC and demonstration to facilitate movement at target joints and correct muscle activation for all testing and exercises performed.     PATIENT EDUCATION: Education details: Pt educated throughout session about proper posture and technique with exercises. Improved exercise technique, movement at target joints, use of target muscles after min to mod verbal, visual, tactile cues.  Person educated: Patient Education method: Explanation, Demonstration, and Verbal  cues Education comprehension: verbalized understanding, returned demonstration, and needs further education   HOME EXERCISE PROGRAM: Continue as previously given;    PT Short Term Goals -       PT SHORT TERM GOAL #1   Title Patient will be independent in home exercise program to improve strength/mobility for better functional independence with ADLs.    Baseline no formal HEp for LE/balance at this time; 02/08/2022=Patient continues to require VC for reminders and assist with hip march/knee ext (seated for strengthening); Reminders to continue to work on static stand at home.    Time 4    Period Weeks    Status Partially Met  04/07/22: complete somewhat regularly     Target Date 12/14/21              PT Long Term Goals -       PT LONG TERM GOAL #1   Title Patient will increase FOTO score to equal to or greater than   41  to demonstrate statistically significant improvement in mobility and quality of life.  Baseline 16 on 2/14; 02/08/2022=Patient unable to complete today- No family present but will attempt again next visit available with family present to assist in completion; 02/17/2022= 40    Time 12    Period Weeks    Status On-going    Target Date 07/28/2022       PT LONG TERM GOAL #3   Title Patient  will complete five times sit to stand test in < 30 seconds with UE assistance indicating an increased LE strength and improved balance.    Baseline 58.47 s with UE assist and with A from PT for R LE positioning; 02/08/2022=54 sec with use of L UE support and Min physical assist to position right LE.  7/6: 30.5 sec, min A for LE positioning, some attempts did not come to full erect posture, did not let go with L UE from armchair with any attempt 05/05/22: 26.65 sec    Time 12    Period Weeks    Status Met    Target Date 05/03/22      PT LONG TERM GOAL #4   Title Patient will increase Berg Balance score by > 6 points to demonstrate decreased fall risk during functional  activities.    Baseline 12 on 2/14; 02/08/2022= 13/56  04/07/22: 17  8/3: 19    Time 12    Period Weeks    Status Met    Target Date 05/03/22      PT LONG TERM GOAL #5   Title Pt will transfer from seated position on table/chair to/from transport chair/WC in order to indicate improved transfer ability.    Baseline Requires Min A for LE positioning and to prevent LOB. 02/08/2022=Patient attempted to perform on her own- Unable to push herself up from chair- Very min assist to position right LE into more flexed position and VC for hand placement. Patient able to stand with min Assist with use of gait belt and VC to lean; 02/17/2022=Patient was able to transfer from transport w/c with some physical assist with Right to swing to pivot to side of chair. She was able to sit to stand today with only CGA and then stand pivot with right arm holding onto arm of chair. Only difficulty was stand to sit- Max VC not to plop into chair. 04/07/22: Pt performed movement of B LE to side of chair independently with chair transfer. Pt required min A with R LE foot placement of floor. Pt requires increased time to complete but complete with CGA only for safety and no cues or A to prevent falling/ plopping into armchair.  8/3: Pt requires min A for maintenance of standig balance, able to navigate from transport chairr to arm chair with hemiwalker, does not controll descent with ahands still " flops" into chair    Time 12    Period Weeks    Status Partially met    Target Date 07/28/2022       PT LONG TERM GOAL #6   Title Patient will ambulate > 150 feet with CGA using Hemiwalker on level surfaces for improved household and short community distances.    Baseline 02/08/2022= 40 feet with use of HW, heavy CGA with max VC for gait sequencing and w/c follow. 5/18 = 85 feet with use of HW, close CGA  7/6: 45 feet, heavy use of hemiwalker 8/3: 85 feet with heavy hemi walker use, no Lob throughout, cues for appropriate weight  distribution    Time 12    Period Weeks  Status On-going    Target Date 07/28/2022        PT LONG TERM GOAL #7   Title Pt will increase 10MWT by at least 0.13 m/s in order to demonstrate clinically significant improvement in community ambulation.    Baseline 02/08/2022= 0.05 m/s using HW 7/6: .01ms with heavy ue use of hemiwalker and CGA for balance 8/3: .0538m   Time 12    Period Weeks    Status Ongoing     Target Date 07/28/2022      8.  Pt will improve 5X STS with full knee extension in standing each rep and with appropriate eccentric control in order to indicate improved transfer efficacy and improved LE strength  Baseline: 52.45 sec with UE support Goal status: INITIAL  9.  Patient will increase Berg Balance score by > 6 points to demonstrate decreased fall risk during functional activities Baseline: 19 Goal status: INITIAL           Plan -     Clinical Impression Statement Continued treatment as laid out in Plan of care and in previous sessions. Patient did demonstrate improved overall gait distance and speed yet remains inconsistent with effort requiring min/mod VC and some physical assist to keep from losing balance or falling. She was able to respond with improved sit to stand with repetitive cues. Pt will continue to benefit from skilled physical therapy intervention to address impairments, improve QOL, and attain therapy goals. Patient's condition has the potential to improve in response to therapy. Maximum improvement is yet to be obtained. The anticipated improvement is attainable and reasonable in a generally predictable time.      Personal Factors and Comorbidities Age;Comorbidity 1;Comorbidity 2;Comorbidity 3+;Time since onset of injury/illness/exacerbation;Other    Comorbidities AFib, Renal Artery thrombosis, situational depression, HTN, Pyelnephritis, Seizure activity, COnstipation, AKI    Examination-Activity Limitations Bathing;Bed  Mobility;Carry;Locomotion Level;Dressing;Sit;Squat;Stairs;Stand;Toileting;Transfers    Examination-Participation Restrictions Church;Community Activity;Laundry    Stability/Clinical Decision Making Evolving/Moderate complexity    Rehab Potential Fair    PT Frequency 2x / week    PT Duration 12 weeks    PT Treatment/Interventions ADLs/Self Care Home Management;Gait training;Stair training;Therapeutic activities;Therapeutic exercise;Balance training;Neuromuscular re-education;Patient/family education;Wheelchair mobility training;Manual techniques;Energy conservation;Dry needling;Passive range of motion;Taping;Spinal Manipulations;Joint Manipulations    PT Next Visit Plan Gait training, Transfer training, LE strengthening as appropriate.    PT Home Exercise Plan HEP: general LE and postural exercises    Consulted and Agree with Plan of Care Patient            JeOllen BowlPT 05/16/22, 11:39 AM

## 2022-05-16 NOTE — Therapy (Signed)
Occupational Therapy Progress Note  Dates of reporting period  04/04/2022   to   05/16/2022    Patient Name: Lindsey [name] MRN: [id number] DOB:Aug 05, 1956, 66 y.o., female Today's Date: 05/16/2022   REFERRING PROVIDER: Tomasa Hose, MD   OT End of Session - 05/16/22 1026     Visit Number 39    Number of Visits 33    Date for OT Re-Evaluation 06/27/22    Authorization Type Progress report period starting 02/17/22    OT Start Time 1015    OT Stop Time 1100    OT Time Calculation (min) 45 min    Equipment Utilized During Treatment transport chair    Activity Tolerance Patient tolerated treatment well    Behavior During Therapy WFL for tasks assessed/performed               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 ([place]) 09/24/2021   Cerebral embolism with cerebral infarction 09/23/2021   Pyelonephritis 09/19/2021   Pain due to onychomycosis of toenails of both feet 11/19/2020   Normocytic anemia 05/31/2020   Acute ischemic left MCA stroke ([place]) 04/30/2020   Right hemiplegia ([place]) 04/30/2020   Cerebrovascular accident ([place]) 04/21/2020   Atrial fibrillation with RVR ([place]) 04/21/2020   Essential hypertension 04/21/2020   Alcohol  abuse 04/21/2020    RUE Measurements (Taken 04/04/22):   PROM Shoulder flexion:  7/03: 0(108)  5/18: 0(105)  PROM Shoulder abduction: 7/03: [id number]  5/18: 0(102)  PROM Elbow:   7/03: 0(0-140)  5/18: 0(0-136)  AROM Wrist extension:  7/03: [id number])  5/18: -40(72)  REFERRING DIAG: CVA  THERAPY DIAG:  Muscle weakness (generalized)  Rationale for Evaluation and Treatment Rehabilitation  PERTINENT HISTORY: Pt. is a 66 y.o. 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 Pt. has Right sided weakness, and receptive, and expressive aphasia. Pt. has had a previous CVA with right sided weakness in July 2021. PMHx includes: AFib, Renal Artery thrombosis, situational depression, HTN, Pyelnephritis, Seizure activity, COnstipation, AKI. Vitamin D deficiency, and urinary incontinence/  PRECAUTIONS: Fall   SUBJECTIVE: Pt.  Reports no pain today.    PAIN:  Are you having pain? No     OBJECTIVE:   TODAY'S TREATMENT:   Rationale for Evaluation and Treatment Rehabilitation  OT TREATMENT    Manual Therapy:   Pt. tolerated soft tissue massage to the right scapular musculature. Pt. tolerated scapular mobilizations in elevation, depression, abduction, and rotation following moist heat modality to the right shoulder. Pt. tolerated soft tissue mobilizations for radius on ulna, and metacarpal spread stretches to decrease tightness, and prepare the UE, and hand for ROM and  therapeutic Ex.Manual therapy was performed independent of, and in preparation for there. Ex.    Therapeutic Exercise:   Pt. tolerated PROM through all joint ranges of the RUE, and hand following moist heat modality, and manual therapy. Pt. worked on reps of active right wrist extension, and right supination with support proximally. Pt. education was provided about opportunities to engage the RUE during daily tasks.    Pt. was accompanied by her sister who  is visiting from [place]. Measurements were obtained, and goals were reviewed with the Pt.  Pt. Is making stedy progress. Pt. Is now using her right hand as a gross stabilizer more at home. Pt. Has improved overall with UE dressing, and LLLE dressing, and hikeing clothing. Pt. Is now able to reach lower below the knees to hike her pants. Pt. continues to present with limited RUE ROM, with increased tightness in the shoulder ranges, scapular region, wrist, and digits. Pt. Also presents with a right shoulder subluxation, and is tolerating a shoulder harness brace. Pt. continues to work on improving RUE functioning in order improve,a and maximize independence with ADLs, and IADL tasks.        PATIENT EDUCATION: Education details: dressing techniques, HEP Person educated: Patient Education method: Medical illustrator Education comprehension: verbalized understanding, returned demonstration, verbal cues required, and tactile cues required   HOME EXERCISE PROGRAM   Continue with ongoing HEPs for ROM to the RUE, hand, and digits.    OT Short Term Goals -       OT SHORT TERM GOAL #1   Title Pt and caregiver will be independent with HEP for RUE    Baseline Eval: No current program. 10th visit: Caregivers  report being comfortable with exercises at home. 01/06/2022: Continue ongoing HEPs for UE. 30th visit: Pt./caregivers to continue with ROM; 40th: pt/caregivers to continue with PROM throughout RUE 05/16/2022:  Continue ongoing pt./caregiver education about HEPs for the RUE.   Time 6   Period Weeks    Status On-going    Target Date 05/16/2022             OT Long Term Goals -       OT LONG TERM GOAL #1   Title Pt. will engage the RUE as a gross assist/atabilizer during ADLs 100% of the time    Baseline 05/16/2022: Pt. Is able to place items, in her right hand, and use her right hand  to stabilize various sizes of bottles while using her left hand to open them. 04/04/2022: Pt. Is using  her right hand to hold the loofa, and stabilize items. 30th visit: Pt. is initiating using her RUE as a gross stabilizer with maxA 01/06/2022: Pt. is starting to engage her RUE as a gross stabiliizer with increased cues. Eval: pt. does not currently engage her RUE during ADLs. 10th visit: Pt. continues to need to work towards using her RUE as a gross stabilizer.    Time 12    Status On-going    Target Date 06/27/22      OT LONG TERM GOAL #2   Title Pt. will perform UE dressing using one armed dressing techniques    Baseline Eval: MaxA, 10th visit: ModA 01/06/2022: min-modA 30th visit; min0-modA, 04/04/2022: minA donning loose shirts, MaxA tighter items/sports bra, independent doffing a shirt and bra 05/16/2022: minA donning loose shirts, MaxA tighter items/sports bra, independent doffing a shirt and bra   Time 12    Period Weeks    Status On-going  Target Date 06/27/22      OT LONG TERM GOAL #3   Title Pt. will require minA to stand and hike pants    Baseline Eval: MaxA, 10th visit: [place] 01/06/2022: [place] 30th visit: ModA 04/04/2022: independent with elastic pants if above the knees. MaxA if they fall below the knee; 04/07/22: min guard-min A to reach below knees to simulate hiking pants 05/16/2022: Min guard-MinA reaching below the knee to hike pants.   Time 12    Period Weeks    Status On-going    Target Date 06/27/22      OT LONG TERM GOAL #4   Title Pt. will perform LE dressing with minA    Baseline Eval: MaxA 10th visit: Pt. requires MaxA for donning her brace, shoes, and socks. ModA with pants. 01/06/2022: ModA pants, MaxA shoes, socks, and brace 30th visit: ModA pants, MaxA shoes, socks, and brace. 04/04/2022: MaxA; 04/07/22: pt can don/doff L sock and shoe with supv, dep for RLE; see above for hiking pants 05/16/2022:  Donning and doffing the Left sock and shoe with supervision. Total Assist for the RLE.   Time 12    Period Weeks    Status On-going    Target Date 06/27/22      OT LONG TERM  GOAL #5   Title Pt. will improve FOTO score by 2 pt. to reflect funcational change    Baseline Eval: FOTO score: 12, 10th visit: FOTO score 27 01/06/2022: 27 30th visit TBA at next visit secondary to no family available during the session; 04/04/22: FOTO 40 05/16/2022: 27   Time 12    Period Weeks    Status On-going    Target Date 06/27/22      OT LONG TERM GOAL #6   Title Patient will transfer to bedside commode or toilet with supervision.    Baseline 30th visit: min-modA. 01/06/2022: min-ModA 04/04/2022: Min-ModA 05/16/2022: min-modA   Time 4    Period Weeks    Status On-going    Target Date 06/27/22        OT LONG TERM GOAL #7                                                      Title: Pt. Will improve AROM wrist extension by 5 degrees in preparation for improving functional  reaching.                                                                                  Baseline:       7/03: -28(72)            Time: 4           Period: Weeks           Status: Ongoing           Target Date: 06/27/2022   Plan - 03/24/22 1429     Clinical Impression Statement Pt. was accompanied by her sister who is visiting from [place]. Measurements were obtained, and goals were  reviewed with the Pt.  Pt. Is making stedy progress. Pt. Is now using her right hand as a gross stabilizer more at home. Pt. Has improved overall with UE dressing, and LLLE dressing, and hikeing clothing. Pt. Is now able to reach lower below the knees to hike her pants. Pt. continues to present with limited RUE ROM, with increased tightness in the shoulder ranges, scapular region, wrist, and digits. Pt. Also presents with a right shoulder subluxation, and is tolerating a shoulder harness brace. Pt. continues to work on improving RUE functioning in order improve,a and maximize independence with ADLs, and IADL tasks.                      OT Occupational Profile and History Detailed Assessment- Review of Records and additional review of  physical, cognitive, psychosocial history related to current functional performance    Occupational performance deficits (Please refer to evaluation for details): Leisure;IADL's;ADL's    Body Structure / Function / Physical Skills ADL;IADL;FMC;ROM;UE functional use;Strength;Decreased knowledge of use of DME;Coordination    Rehab Potential Fair    Clinical Decision Making Several treatment options, min-mod task modification necessary    Comorbidities Affecting Occupational Performance: May have comorbidities impacting occupational performance    Modification or Assistance to Complete Evaluation  Min-Moderate modification of tasks or assist with assess necessary to complete eval    OT Frequency 2x / week    OT Duration 12 weeks    OT Treatment/Interventions Self-care/ADL training;Neuromuscular education;Electrical Stimulation;Patient/family education;Therapeutic activities;Passive range of motion;Building services engineer;Therapeutic exercise;DME and/or AE instruction;Moist Heat    Consulted and Agree with Plan of Care Patient             Olegario Messier, MS, OTR/L   Olegario Messier, OT 05/16/2022, 10:32 AM

## 2022-05-19 ENCOUNTER — Ambulatory Visit: Payer: Medicare Other | Admitting: Occupational Therapy

## 2022-05-19 ENCOUNTER — Ambulatory Visit: Payer: Medicare Other

## 2022-05-19 ENCOUNTER — Encounter: Payer: Self-pay | Admitting: Occupational Therapy

## 2022-05-19 DIAGNOSIS — M6281 Muscle weakness (generalized): Secondary | ICD-10-CM | POA: Diagnosis not present

## 2022-05-19 DIAGNOSIS — R262 Difficulty in walking, not elsewhere classified: Secondary | ICD-10-CM

## 2022-05-19 DIAGNOSIS — R2689 Other abnormalities of gait and mobility: Secondary | ICD-10-CM

## 2022-05-19 DIAGNOSIS — R2681 Unsteadiness on feet: Secondary | ICD-10-CM

## 2022-05-19 DIAGNOSIS — R269 Unspecified abnormalities of gait and mobility: Secondary | ICD-10-CM

## 2022-05-19 DIAGNOSIS — I63542 Cerebral infarction due to unspecified occlusion or stenosis of left cerebellar artery: Secondary | ICD-10-CM

## 2022-05-19 DIAGNOSIS — R278 Other lack of coordination: Secondary | ICD-10-CM

## 2022-05-19 NOTE — Therapy (Signed)
OUTPATIENT PHYSICAL THERAPY TREATMENT NOTE   Patient Name: Lindsey [name] MRN: [id number] DOB:1955-12-31, 66 y.o., female Today's Date: 05/19/2022  PCP: Dr. Tomasa Hose MD REFERRING PROVIDER: Leeroy Cha MD   PT End of Session - 05/19/22 1027     Visit Number 41    Number of Visits 70    Date for PT Re-Evaluation 07/28/22    Authorization Type Medicare    Authorization Time Period Cert through 06/07/61; Recert 10/06/863-04/10/4695; Recert 11/11/5282-13/24    Progress Note Due on Visit 50    PT Start Time 1016    PT Stop Time 1059    PT Time Calculation (min) 43 min    Equipment Utilized During Treatment Gait belt    Activity Tolerance Patient limited by fatigue;No increased pain    Behavior During Therapy WFL for tasks assessed/performed                  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 No pertinent surgical history. Patient Active Problem List   Diagnosis Date Noted   Blood clotting disorder (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 ([place]) 09/24/2021   Cerebral embolism with cerebral infarction 09/23/2021   Pyelonephritis 09/19/2021   Pain due to onychomycosis of toenails of both feet 11/19/2020   Normocytic anemia 05/31/2020   Acute ischemic left MCA stroke ([place]) 04/30/2020   Right hemiplegia ([place]) 04/30/2020   Cerebrovascular accident ([place])  04/21/2020   Atrial fibrillation with RVR ([place]) 04/21/2020   Essential hypertension 04/21/2020   Alcohol abuse 04/21/2020    REFERRING DIAG: s/p CVA  THERAPY DIAG:  Muscle weakness (generalized)  Difficulty in walking, not elsewhere classified  Unsteadiness on feet  Abnormality of gait and mobility  Other abnormalities of gait and mobility  Other lack of coordination  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 At this point she is limited to wheelchair as her main means of mobility. Pt caregiver reports all detailed information. Pt has hesitation with the left lower extremity and significant weakness on the right lower extremity. Pt will take 2-3 steps when transfering. Pt caregiver assists with all transfers.Pt has shower with ledge to steps over ledge when entering and utilizes bedside commode. Pt has 2 level home with chair lift for upstairs access. Pt would like to improve ransfers, improve neglect on her R side and improve her overall strength and mobility.Pt. is a 66 y.o. 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 Pt. has Right sided weakness, and receptive, and expressive aphasia. Pt. has had a previous CVA with right sided weakness in  July 2021. PMHx includes: AFib, Renal Artery thrombosis, situational depression, HTN, Pyelnephritis, Seizure activity, COnstipation, AKI. Vitamin D deficiency, and urinary incontinence.   PRECAUTIONS: fall risk, wearing RLE AFO  SUBJECTIVE: Patient reports some leg soreness after last visit but feeling fine and no pain today.   PAIN:  Are you having pain? No      TODAY'S TREATMENT:  05/19/22   INTERVENTIONS:      Therapeutic  Exercise:  Seated:     Hamstring curl (AROM) with leg elevated on right and BTB on right 2 sets of 12 reps Seated knee ext 3# AW on left -2sets x 12 reps Hip abd/add BLE with visual cues for feedback to initial more active motion of Right LE 2 sets of 10 reps  Hip march AAROM on right and 4lb. AW on left 2 sets of 12 reps  Pt required verbal cues for correct exercise technique. She often compensates for LE weakness with increased trunk movement and knee ext to facilitate increased Right hip flex LE ROM. She reports moderate fatigue after exercise;     Therapeutic activity:   Instructed patient in STS from arm chair x 15 reps. min A and mod VCs for hand placement; More difficult requiring increased VC today.  Kick the soccer ball with right LE- Focusing on forward/backward hip swing x several min > 30 total kicks with 1 seated rest break Standing hip swings Forward/backward - LUE support x 20 reps each LE focusing on heel strike and extending LE into foot neutral as she was unable to achieve toe off with right AFO       Education provided throughout session via VC/TC and demonstration to facilitate movement at target joints and correct muscle activation for all testing and exercises performed.     PATIENT EDUCATION: Education details: Pt educated throughout session about proper posture and technique with exercises. Improved exercise technique, movement at target joints, use of target muscles after min to mod verbal, visual, tactile cues.  Person educated: Patient Education method: Explanation, Demonstration, and Verbal cues Education comprehension: verbalized understanding, returned demonstration, and needs further education   HOME EXERCISE PROGRAM: Continue as previously given;    PT Short Term Goals -       PT SHORT TERM GOAL #1   Title Patient will be independent in home exercise program to improve strength/mobility for better functional independence with ADLs.     Baseline no formal HEp for LE/balance at this time; 02/08/2022=Patient continues to require VC for reminders and assist with hip march/knee ext (seated for strengthening); Reminders to continue to work on static stand at home.    Time 4    Period Weeks    Status Partially Met  04/07/22: complete somewhat regularly     Target Date 12/14/21              PT Long Term Goals -       PT LONG TERM GOAL #1   Title Patient will increase FOTO score to equal to or greater than   41  to demonstrate statistically significant improvement in mobility and quality of life.    Baseline 16 on 2/14; 02/08/2022=Patient unable to complete today- No family present but will attempt again next visit available with family present to assist in completion; 02/17/2022= 40    Time 12    Period Weeks    Status On-going    Target Date 07/28/2022       PT LONG TERM GOAL #3  Title Patient  will complete five times sit to stand test in < 30 seconds with UE assistance indicating an increased LE strength and improved balance.    Baseline 58.47 s with UE assist and with A from PT for R LE positioning; 02/08/2022=54 sec with use of L UE support and Min physical assist to position right LE.  7/6: 30.5 sec, min A for LE positioning, some attempts did not come to full erect posture, did not let go with L UE from armchair with any attempt 05/05/22: 26.65 sec    Time 12    Period Weeks    Status Met    Target Date 05/03/22      PT LONG TERM GOAL #4   Title Patient will increase Berg Balance score by > 6 points to demonstrate decreased fall risk during functional activities.    Baseline 12 on 2/14; 02/08/2022= 13/56  04/07/22: 17  8/3: 19    Time 12    Period Weeks    Status Met    Target Date 05/03/22      PT LONG TERM GOAL #5   Title Pt will transfer from seated position on table/chair to/from transport chair/WC in order to indicate improved transfer ability.    Baseline Requires Min A for LE positioning and to prevent LOB.  02/08/2022=Patient attempted to perform on her own- Unable to push herself up from chair- Very min assist to position right LE into more flexed position and VC for hand placement. Patient able to stand with min Assist with use of gait belt and VC to lean; 02/17/2022=Patient was able to transfer from transport w/c with some physical assist with Right to swing to pivot to side of chair. She was able to sit to stand today with only CGA and then stand pivot with right arm holding onto arm of chair. Only difficulty was stand to sit- Max VC not to plop into chair. 04/07/22: Pt performed movement of B LE to side of chair independently with chair transfer. Pt required min A with R LE foot placement of floor. Pt requires increased time to complete but complete with CGA only for safety and no cues or A to prevent falling/ plopping into armchair.  8/3: Pt requires min A for maintenance of standig balance, able to navigate from transport chairr to arm chair with hemiwalker, does not controll descent with ahands still " flops" into chair    Time 12    Period Weeks    Status Partially met    Target Date 07/28/2022       PT LONG TERM GOAL #6   Title Patient will ambulate > 150 feet with CGA using Hemiwalker on level surfaces for improved household and short community distances.    Baseline 02/08/2022= 40 feet with use of HW, heavy CGA with max VC for gait sequencing and w/c follow. 5/18 = 85 feet with use of HW, close CGA  7/6: 45 feet, heavy use of hemiwalker 8/3: 85 feet with heavy hemi walker use, no Lob throughout, cues for appropriate weight distribution    Time 12    Period Weeks    Status On-going    Target Date 07/28/2022        PT LONG TERM GOAL #7   Title Pt will increase 10MWT by at least 0.13 m/s in order to demonstrate clinically significant improvement in community ambulation.    Baseline 02/08/2022= 0.05 m/s using HW 7/6: .040ms with heavy ue use of hemiwalker and CGA  for balance 8/3: .047ms   Time  12    Period Weeks    Status Ongoing     Target Date 07/28/2022      8.  Pt will improve 5X STS with full knee extension in standing each rep and with appropriate eccentric control in order to indicate improved transfer efficacy and improved LE strength  Baseline: 52.45 sec with UE support Goal status: INITIAL  9.  Patient will increase Berg Balance score by > 6 points to demonstrate decreased fall risk during functional activities Baseline: 19 Goal status: INITIAL           Plan -     Clinical Impression Statement Continued treatment as laid out in Plan of care and in previous sessions. Patient continues to present with inconsistent ability with sit to stand. Requiring VC at times for hand placement, scooting, leaning forward. She also struggles with bringing her leg back (extending LE) after kicking ball but did better with VC during subsequent hip swing activity.  Pt will continue to benefit from skilled physical therapy intervention to address impairments, improve QOL, and attain therapy goals.       Personal Factors and Comorbidities Age;Comorbidity 1;Comorbidity 2;Comorbidity 3+;Time since onset of injury/illness/exacerbation;Other    Comorbidities AFib, Renal Artery thrombosis, situational depression, HTN, Pyelnephritis, Seizure activity, COnstipation, AKI    Examination-Activity Limitations Bathing;Bed Mobility;Carry;Locomotion Level;Dressing;Sit;Squat;Stairs;Stand;Toileting;Transfers    Examination-Participation Restrictions Church;Community Activity;Laundry    Stability/Clinical Decision Making Evolving/Moderate complexity    Rehab Potential Fair    PT Frequency 2x / week    PT Duration 12 weeks    PT Treatment/Interventions ADLs/Self Care Home Management;Gait training;Stair training;Therapeutic activities;Therapeutic exercise;Balance training;Neuromuscular re-education;Patient/family education;Wheelchair mobility training;Manual techniques;Energy conservation;Dry  needling;Passive range of motion;Taping;Spinal Manipulations;Joint Manipulations    PT Next Visit Plan Gait training, Transfer training, LE strengthening as appropriate.    PT Home Exercise Plan HEP: general LE and postural exercises    Consulted and Agree with Plan of Care Patient            JOllen Bowl PT 05/19/22, 11:26 AM

## 2022-05-19 NOTE — Therapy (Addendum)
OUTPATIENT OCCUPATIONAL THERAPY TREATMENT NOTE   Patient Name: Lindsey [name] MRN: [id number] DOB:10-01-1956, 66 y.o., female Today's Date: 05/19/2022   REFERRING PROVIDER: Karie Fetch, MD   OT End of Session - 05/19/22 1153     Visit Number 51    Number of Visits 72    Date for OT Re-Evaluation 06/27/22    Authorization Type Progress report period starting 02/17/22    OT Start Time 1100    OT Stop Time 1145    OT Time Calculation (min) 45 min    Activity Tolerance Patient tolerated treatment well    Behavior During Therapy WFL for tasks assessed/performed               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 No pertinent surgical history. 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) 09/24/2021   Cerebral embolism with cerebral infarction 09/23/2021   Pyelonephritis 09/19/2021   Pain due to onychomycosis of toenails of both feet 11/19/2020   Normocytic anemia 05/31/2020   Acute ischemic left MCA stroke (HCC) 04/30/2020   Right hemiplegia (HCC) 04/30/2020   Cerebrovascular accident (HCC) 04/21/2020   Atrial fibrillation with RVR (HCC) 04/21/2020   Essential hypertension 04/21/2020   Alcohol abuse 04/21/2020    RUE Measurements (Taken 04/04/22):   PROM Shoulder flexion:  7/03:  0(108)  5/18: 0(105)  PROM Shoulder abduction: 7/03: 7(341)  5/18: 0(102)  PROM Elbow:   7/03: 0(0-140)  5/18: 0(0-136)  AROM Wrist extension:  7/03: -93(79)  5/18: -40(72)  REFERRING DIAG: CVA  THERAPY DIAG:  Muscle weakness (generalized)  Rationale for Evaluation and Treatment Rehabilitation  PERTINENT HISTORY: Pt. is a 66 y.o. 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 Pt. has Right sided weakness, and receptive, and expressive aphasia. Pt. has had a previous CVA with right sided weakness in July 2021. PMHx includes: AFib, Renal Artery thrombosis, situational depression, HTN, Pyelnephritis, Seizure activity, COnstipation, AKI. Vitamin D deficiency, and urinary incontinence/  PRECAUTIONS: Fall   SUBJECTIVE: Pt.  Reports no pain today.    PAIN:  Are you having pain? No     OBJECTIVE:   TODAY'S TREATMENT:   Rationale for Evaluation and Treatment Rehabilitation  OT TREATMENT    Manual Therapy:   Pt. tolerated soft tissue massage to the right scapular musculature. Pt. tolerated scapular mobilizations in elevation, depression, abduction, and rotation following moist heat modality to the right shoulder, wrist, and hand. Pt. tolerated soft tissue mobilizations for radius on ulna, and metacarpal spread stretches to decrease tightness, and prepare the UE, and hand for ROM and therapeutic Ex. Manual therapy was performed independent of, and in preparation for there. Ex. Pt. education was provided about  soft tissue massage to the right forearm.   Therapeutic Exercise:   Pt. tolerated PROM through all joint ranges of the RUE, and hand following moist heat modality, and manual therapy. Pt. worked on reps of active right wrist extension, and right supination with support proximally. Pt. education was provided about opportunities to engage the RUE during daily tasks.   Pt required increased verbal, and  tactile cues to perform self-ROM to the right shoulder. Pt. Required fewer cues for ROM to the right wrist. Pt. Required tactile, and verbal cues for soft tissue massage to the right forearm. Pt. continues to present with flexor tone, and tightness through the RUE, wrist, and digits, however was able to achieve increased PROM in the right shoulder, and active ROM for reps of wrist extension. Pt. continues to respond well to the session with less tone, tightness, and increased ROM noted throughout the RUE following ROM, and manual therapy. Pt. presented with increased tightness with right shoulder abduction, and flexion. Pt. continues to work on normalizing tone, and facilitating active volitional movement in the RUE, and hand to improve engagement of the RUE during ADLs, as well as improve transfers, and functional mobility for toileting.         PATIENT EDUCATION: Education details: dressing techniques, HEP Person educated: Patient Education method: Medical illustrator Education comprehension: verbalized understanding, returned demonstration, verbal cues required, and tactile cues required   HOME EXERCISE PROGRAM   Continue with ongoing HEPs for ROM to the RUE, hand, and digits.    OT Short Term Goals -       OT SHORT TERM GOAL #1   Title Pt and caregiver will be independent with HEP for RUE    Baseline Eval: No current program. 10th visit: Caregivers  report being comfortable with exercises at home. 01/06/2022: Continue ongoing HEPs for UE. 30th visit: Pt./caregivers to continue with ROM; 40th: pt/caregivers to continue with PROM throughout RUE   Time 6   Period Weeks    Status On-going    Target Date 05/16/2022             OT Long Term Goals -       OT LONG TERM GOAL #1   Title Pt. will engage the RUE as a gross assist/atabilizer during ADLs 100% of the time    Baseline 04/04/2022: Pt. Is using her right hand to hold the loofa, and stabilize items.30th visit: Pt. is  initiating using her RUE as a gross stabilizer with maxA 01/06/2022: Pt. is starting to engage her RUE as a gross stabiliizer with increased cues. Eval: pt. does not currently engage her RUE during ADLs. 10th visit: Pt. continues to need to work towards using her RUE as a gross stabilizer.    Time 12    Status On-going    Target Date 06/27/22      OT LONG TERM GOAL #2   Title Pt. will perform UE dressing using one armed dressing techniques    Baseline Eval: MaxA, 10th visit: ModA 01/06/2022: min-modA 30th visit; min0-modA, 04/04/2022: minA donning loose shirts, MaxA tighter items/sports bra, independent doffing a shirt and bra    Time 12    Period Weeks    Status On-going    Target Date 06/27/22      OT LONG TERM GOAL #3   Title Pt. will require minA to stand and hike pants    Baseline Eval: MaxA, 10th visit: ModA 01/06/2022: ModA 30th visit: ModA 04/04/2022: independent with elastic pants  if above the knees. MaxA if they fall below the knee; 04/07/22: min guard-min A to reach below knees to simulate hiking pants   Time 12    Period Weeks    Status On-going    Target Date 06/27/22      OT LONG TERM GOAL #4   Title Pt. will perform LE dressing with minA    Baseline Eval: MaxA 10th visit: Pt. requires MaxA for donning her brace, shoes, and socks. ModA with pants. 01/06/2022: ModA pants, MaxA shoes, socks, and brace 30th visit: ModA pants, MaxA shoes, socks, and brace. 04/04/2022: MaxA; 04/07/22: pt can don/doff L sock and shoe with supv, dep for RLE; see above for hiking pants   Time 12    Period Weeks    Status On-going    Target Date 06/27/22      OT LONG TERM GOAL #5   Title Pt. will improve FOTO score by 2 pt. to reflect funcational change    Baseline Eval: FOTO score: 12, 10th visit: FOTO score 27 01/06/2022: 27 30th visit TBA at next visit secondary to no family available during the session; 04/04/22: FOTO 40   Time 12    Period Weeks    Status On-going    Target Date 06/27/22      OT LONG  TERM GOAL #6   Title Patient will transfer to bedside commode or toilet with supervision.    Baseline 30th visit: min-modA. 01/06/2022: min-ModA 04/04/2022: Min-ModA   Time 4    Period Weeks    Status On-going    Target Date 06/27/22        OT LONG TERM GOAL #7                                                      Title: Pt. Will improve AROM wrist extension by 5 degrees in preparation for improving functional  reaching.                                                                                  Baseline:       7/03: -28(72)            Time: 4           Period: Weeks           Status: Ongoing           Target Date: 06/27/2022   Plan - 03/24/22 1429     Clinical Impression Statement Pt required increased verbal, and tactile cues to perform self-ROM to the right shoulder. Pt. Required fewer cues for ROM to the right wrist. Pt. Required tactile, and verbal cues for soft tissue massage to the right forearm. Pt. continues to present with flexor tone, and tightness through the RUE, wrist, and digits, however was able to achieve increased PROM in the right shoulder, and active ROM for reps of wrist extension. Pt. continues to respond well to the session with less tone, tightness, and increased ROM noted throughout the RUE following  ROM, and manual therapy. Pt. presented with increased tightness with right shoulder abduction, and flexion. Pt. continues to work on normalizing tone, and facilitating active volitional movement in the RUE, and hand to improve engagement of the RUE during ADLs, as well as improve transfers, and functional mobility for toileting.                         OT Occupational Profile and History Detailed Assessment- Review of Records and additional review of physical, cognitive, psychosocial history related to current functional performance    Occupational performance deficits (Please refer to evaluation for details): Leisure;IADL's;ADL's    Body Structure / Function /  Physical Skills ADL;IADL;FMC;ROM;UE functional use;Strength;Decreased knowledge of use of DME;Coordination    Rehab Potential Fair    Clinical Decision Making Several treatment options, min-mod task modification necessary    Comorbidities Affecting Occupational Performance: May have comorbidities impacting occupational performance    Modification or Assistance to Complete Evaluation  Min-Moderate modification of tasks or assist with assess necessary to complete eval    OT Frequency 2x / week    OT Duration 12 weeks    OT Treatment/Interventions Self-care/ADL training;Neuromuscular education;Electrical Stimulation;Patient/family education;Therapeutic activities;Passive range of motion;Therapist, nutritional;Therapeutic exercise;DME and/or AE instruction;Moist Heat    Consulted and Agree with Plan of Care Patient             Harrel Carina, MS, OTR/L   Harrel Carina, OT 05/19/2022, 11:55 AM

## 2022-05-23 ENCOUNTER — Ambulatory Visit: Payer: Medicare Other

## 2022-05-23 ENCOUNTER — Ambulatory Visit: Payer: Medicare Other | Admitting: Occupational Therapy

## 2022-05-23 DIAGNOSIS — M6281 Muscle weakness (generalized): Secondary | ICD-10-CM

## 2022-05-23 DIAGNOSIS — R2689 Other abnormalities of gait and mobility: Secondary | ICD-10-CM

## 2022-05-23 DIAGNOSIS — R269 Unspecified abnormalities of gait and mobility: Secondary | ICD-10-CM

## 2022-05-23 DIAGNOSIS — R262 Difficulty in walking, not elsewhere classified: Secondary | ICD-10-CM

## 2022-05-23 DIAGNOSIS — R278 Other lack of coordination: Secondary | ICD-10-CM

## 2022-05-23 DIAGNOSIS — I63542 Cerebral infarction due to unspecified occlusion or stenosis of left cerebellar artery: Secondary | ICD-10-CM

## 2022-05-23 DIAGNOSIS — I63512 Cerebral infarction due to unspecified occlusion or stenosis of left middle cerebral artery: Secondary | ICD-10-CM

## 2022-05-23 DIAGNOSIS — R2681 Unsteadiness on feet: Secondary | ICD-10-CM

## 2022-05-23 NOTE — Therapy (Signed)
OUTPATIENT PHYSICAL THERAPY TREATMENT NOTE   Patient Name: Lindsey [name] MRN: [id number] DOB:09/01/1956, 66 y.o., female Today's Date: 05/23/2022  PCP: Dr. Tomasa Hose MD REFERRING PROVIDER: Leeroy Cha MD   PT End of Session - 05/23/22 1031     Visit Number 42    Number of Visits 51    Date for PT Re-Evaluation 07/28/22    Authorization Type Medicare    Authorization Time Period Cert through 10/08/36; Recert 01/06/6598-12/06/7015; Recert 04/10/3902-00/92    Progress Note Due on Visit 50    PT Start Time 0933    PT Stop Time 1015    PT Time Calculation (min) 42 min    Equipment Utilized During Treatment Gait belt    Activity Tolerance Patient limited by fatigue;No increased pain    Behavior During Therapy WFL for tasks assessed/performed                   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)    Diverticulosis    History of ischemic left MCA stroke    Hypertension    Pain due to onychomycosis of toenails of both feet    Renal artery thrombosis (HCC)    Uterine prolapse    History reviewed. No pertinent surgical history. 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 ([place]) 09/24/2021   Cerebral embolism with cerebral infarction 09/23/2021   Pyelonephritis 09/19/2021   Pain due to onychomycosis of toenails of both feet 11/19/2020   Normocytic anemia 05/31/2020   Acute ischemic left MCA stroke ([place]) 04/30/2020   Right hemiplegia ([place]) 04/30/2020   Cerebrovascular accident ([place])  04/21/2020   Atrial fibrillation with RVR ([place]) 04/21/2020   Essential hypertension 04/21/2020   Alcohol abuse 04/21/2020    REFERRING DIAG: s/p CVA  THERAPY DIAG:  Muscle weakness (generalized)  Difficulty in walking, not elsewhere classified  Unsteadiness on feet  Abnormality of gait and mobility  Other abnormalities of gait and mobility  Other lack of coordination  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 At this point she is limited to wheelchair as her main means of mobility. Pt caregiver reports all detailed information. Pt has hesitation with the left lower extremity and significant weakness on the right lower extremity. Pt will take 2-3 steps when transfering. Pt caregiver assists with all transfers.Pt has shower with ledge to steps over ledge when entering and utilizes bedside commode. Pt has 2 level home with chair lift for upstairs access. Pt would like to improve ransfers, improve neglect on her R side and improve her overall strength and mobility.Pt. is a 66 y.o. 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 Pt. has Right sided weakness, and receptive, and expressive aphasia. Pt. has had  a previous CVA with right sided weakness in July 2021. PMHx includes: AFib, Renal Artery thrombosis, situational depression, HTN, Pyelnephritis, Seizure activity, COnstipation, AKI. Vitamin D deficiency, and urinary incontinence.   PRECAUTIONS: fall risk, wearing RLE AFO  SUBJECTIVE:  Patient reports doing well with no new issues. Denies any pain or falls.  PAIN:  Are you having pain? No      TODAY'S TREATMENT:  05/23/22   INTERVENTIONS:       Therapeutic Exercise:  Seated:     Hamstring curl  BTB  2 sets of 12 reps BLE Hip march AROM alt LE - 2 set of 12 reps Seated knee ext - AROM- 2 set of 12 reps Step tap - focusing on right LE strength and weight bearing -2 sets of 10 reps each LE PROM to right LE- hamstring/hip ER/IR  Gait training:   Right LE Swings- Focusing on forward/backward hip swing by placing cone at heel strike position and the other cone at toe off x several min > 30 total swings with 1 seated rest break then switched and perform the same with left LE to focus on right LE weightbearing. Patient exhibited initial right knee buckling but did improve with practice.  Gait in // bars - forward and backward down and back x 2 without rest. Patient was able to take short reciprocal steps forward- but more difficulty with extending right LE -unable to flex right knee enough well to lift Right LE       Education provided throughout session via VC/TC and demonstration to facilitate movement at target joints and correct muscle activation for all testing and exercises performed.     PATIENT EDUCATION: Education details: Pt educated throughout session about proper posture and technique with exercises. Improved exercise technique, movement at target joints, use of target muscles after min to mod verbal, visual, tactile cues.  Person educated: Patient Education method: Explanation, Demonstration, and Verbal cues Education comprehension: verbalized understanding, returned demonstration, and needs further education   HOME EXERCISE PROGRAM: Continue as previously given;    PT Short Term Goals -       PT SHORT TERM GOAL #1   Title Patient will be independent in home exercise program to improve strength/mobility for better functional independence with ADLs.    Baseline no formal HEp for LE/balance at this time; 02/08/2022=Patient continues to require VC for reminders and assist with hip march/knee ext (seated for  strengthening); Reminders to continue to work on static stand at home.    Time 4    Period Weeks    Status Partially Met  04/07/22: complete somewhat regularly     Target Date 12/14/21              PT Long Term Goals -       PT LONG TERM GOAL #1   Title Patient will increase FOTO score to equal to or greater than   41  to demonstrate statistically significant improvement in mobility and quality of life.    Baseline 16 on 2/14; 02/08/2022=Patient unable to complete today- No family present but will attempt again next visit available with family present to assist in completion; 02/17/2022= 40    Time 12    Period Weeks    Status On-going    Target Date 07/28/2022       PT LONG TERM GOAL #3   Title Patient  will complete five times sit to stand test in < 30 seconds with UE assistance indicating an increased  LE strength and improved balance.    Baseline 58.47 s with UE assist and with A from PT for R LE positioning; 02/08/2022=54 sec with use of L UE support and Min physical assist to position right LE.  7/6: 30.5 sec, min A for LE positioning, some attempts did not come to full erect posture, did not let go with L UE from armchair with any attempt 05/05/22: 26.65 sec    Time 12    Period Weeks    Status Met    Target Date 05/03/22      PT LONG TERM GOAL #4   Title Patient will increase Berg Balance score by > 6 points to demonstrate decreased fall risk during functional activities.    Baseline 12 on 2/14; 02/08/2022= 13/56  04/07/22: 17  8/3: 19    Time 12    Period Weeks    Status Met    Target Date 05/03/22      PT LONG TERM GOAL #5   Title Pt will transfer from seated position on table/chair to/from transport chair/WC in order to indicate improved transfer ability.    Baseline Requires Min A for LE positioning and to prevent LOB. 02/08/2022=Patient attempted to perform on her own- Unable to push herself up from chair- Very min assist to position right LE into more flexed position  and VC for hand placement. Patient able to stand with min Assist with use of gait belt and VC to lean; 02/17/2022=Patient was able to transfer from transport w/c with some physical assist with Right to swing to pivot to side of chair. She was able to sit to stand today with only CGA and then stand pivot with right arm holding onto arm of chair. Only difficulty was stand to sit- Max VC not to plop into chair. 04/07/22: Pt performed movement of B LE to side of chair independently with chair transfer. Pt required min A with R LE foot placement of floor. Pt requires increased time to complete but complete with CGA only for safety and no cues or A to prevent falling/ plopping into armchair.  8/3: Pt requires min A for maintenance of standig balance, able to navigate from transport chairr to arm chair with hemiwalker, does not controll descent with ahands still " flops" into chair    Time 12    Period Weeks    Status Partially met    Target Date 07/28/2022       PT LONG TERM GOAL #6   Title Patient will ambulate > 150 feet with CGA using Hemiwalker on level surfaces for improved household and short community distances.    Baseline 02/08/2022= 40 feet with use of HW, heavy CGA with max VC for gait sequencing and w/c follow. 5/18 = 85 feet with use of HW, close CGA  7/6: 45 feet, heavy use of hemiwalker 8/3: 85 feet with heavy hemi walker use, no Lob throughout, cues for appropriate weight distribution    Time 12    Period Weeks    Status On-going    Target Date 07/28/2022        PT LONG TERM GOAL #7   Title Pt will increase 10MWT by at least 0.13 m/s in order to demonstrate clinically significant improvement in community ambulation.    Baseline 02/08/2022= 0.05 m/s using HW 7/6: .079ms with heavy ue use of hemiwalker and CGA for balance 8/3: .0570m   Time 12    Period Weeks    Status Ongoing  Target Date 07/28/2022      8.  Pt will improve 5X STS with full knee extension in standing each rep  and with appropriate eccentric control in order to indicate improved transfer efficacy and improved LE strength  Baseline: 52.45 sec with UE support Goal status: INITIAL  9.  Patient will increase Berg Balance score by > 6 points to demonstrate decreased fall risk during functional activities Baseline: 19 Goal status: INITIAL           Plan -     Clinical Impression Statement Continued treatment as laid out in Plan of care and in previous sessions. Patient made some improvement within therapy session- adapting to swing right LE more consistently and improved right LE strength with more active vs. AAROM exercises today.   Pt will continue to benefit from skilled physical therapy intervention to address impairments, improve QOL, and attain therapy goals.       Personal Factors and Comorbidities Age;Comorbidity 1;Comorbidity 2;Comorbidity 3+;Time since onset of injury/illness/exacerbation;Other    Comorbidities AFib, Renal Artery thrombosis, situational depression, HTN, Pyelnephritis, Seizure activity, COnstipation, AKI    Examination-Activity Limitations Bathing;Bed Mobility;Carry;Locomotion Level;Dressing;Sit;Squat;Stairs;Stand;Toileting;Transfers    Examination-Participation Restrictions Church;Community Activity;Laundry    Stability/Clinical Decision Making Evolving/Moderate complexity    Rehab Potential Fair    PT Frequency 2x / week    PT Duration 12 weeks    PT Treatment/Interventions ADLs/Self Care Home Management;Gait training;Stair training;Therapeutic activities;Therapeutic exercise;Balance training;Neuromuscular re-education;Patient/family education;Wheelchair mobility training;Manual techniques;Energy conservation;Dry needling;Passive range of motion;Taping;Spinal Manipulations;Joint Manipulations    PT Next Visit Plan Gait training, Transfer training, LE strengthening as appropriate.    PT Home Exercise Plan HEP: general LE and postural exercises    Consulted and Agree with  Plan of Care Patient            Ollen Bowl, PT 05/23/22, 10:44 AM

## 2022-05-23 NOTE — Therapy (Signed)
OUTPATIENT OCCUPATIONAL THERAPY TREATMENT NOTE   Patient Name: Lindsey [name] MRN: [id number] DOB:Dec 27, 1955, 66 y.o., female Today's Date: 05/23/2022   REFERRING PROVIDER: Tomasa Hose, MD   OT End of Session - 05/23/22 1205     Visit Number 71    Number of Visits 59    Date for OT Re-Evaluation 06/27/22    Authorization Type Progress report period starting 02/17/22    OT Start Time 1020    OT Stop Time 1100    OT Time Calculation (min) 40 min    Activity Tolerance Patient tolerated treatment well    Behavior During Therapy WFL for tasks assessed/performed               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 ([place]) 09/24/2021   Cerebral embolism with cerebral infarction 09/23/2021   Pyelonephritis 09/19/2021   Pain due to onychomycosis of toenails of both feet 11/19/2020   Normocytic anemia 05/31/2020   Acute ischemic left MCA stroke ([place]) 04/30/2020   Right hemiplegia ([place]) 04/30/2020   Cerebrovascular accident ([place]) 04/21/2020   Atrial fibrillation with RVR ([place]) 04/21/2020   Essential hypertension 04/21/2020   Alcohol abuse 04/21/2020    RUE Measurements (Taken 04/04/22):   PROM Shoulder flexion:  7/03: 0(108)  5/18:  0(105)  PROM Shoulder abduction: 7/03: [id number]  5/18: 0(102)  PROM Elbow:   7/03: 0(0-140)  5/18: 0(0-136)  AROM Wrist extension:  7/03: [id number]  5/18: -40(72)  REFERRING DIAG: CVA  THERAPY DIAG:  Muscle weakness (generalized)  Other lack of coordination  Rationale for Evaluation and Treatment Rehabilitation  PERTINENT HISTORY: Pt. is a 66 y.o. 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 Pt. has Right sided weakness, and receptive, and expressive aphasia. Pt. has had a previous CVA with right sided weakness in July 2021. PMHx includes: AFib, Renal Artery thrombosis, situational depression, HTN, Pyelnephritis, Seizure activity, COnstipation, AKI. Vitamin D deficiency, and urinary incontinence/  PRECAUTIONS: Fall   SUBJECTIVE: Pt.  Reports no pain today.    PAIN:  Are you having pain? No     OBJECTIVE:   TODAY'S TREATMENT:   Rationale for Evaluation and Treatment Rehabilitation  OT TREATMENT    Manual Therapy:   Pt. tolerated soft tissue massage to the right scapular musculature. Pt. tolerated scapular mobilizations in elevation, depression, abduction, and rotation following moist heat modality to the right shoulder, wrist, and hand. Pt. tolerated soft tissue mobilizations for radius on ulna, and metacarpal spread stretches to decrease tightness, and prepare the UE, and hand for ROM and therapeutic Ex. Manual therapy was performed independent of, and in preparation for there. Ex.  Pt. education was provided about soft tissue massage to the right forearm.   Therapeutic Exercise:   Pt. tolerated PROM through all joint ranges of the RUE, and hand following moist heat modality, and manual therapy. Pt. worked on reps of active right wrist extension, and right supination with support proximally. Pt. education was provided about opportunities to engage the RUE during daily tasks.   Pt. continues to  require increased verbal, and tactile cues to perform self-ROM to the right shoulder. Pt. Required fewer cues for ROM to the right wrist. Pt. Required tactile, and verbal cues for soft tissue massage to the right forearm. Pt. continues to present with flexor tone, and tightness through the RUE, wrist, and digits, however was able to achieve increased PROM in the right shoulder, and active ROM for reps of wrist extension. Pt. continues to respond well to the session with less tone, tightness, and increased ROM noted throughout the RUE following ROM, and manual therapy. Pt. presented with increased tightness with right shoulder abduction, and flexion. Pt. continues to work on normalizing tone, and facilitating active volitional movement in the RUE, and hand to improve engagement of the RUE during ADLs, as well as improve transfers, and functional mobility for toileting.         PATIENT EDUCATION: Education details: dressing techniques, HEP Person educated: Patient Education method: Medical illustrator Education comprehension: verbalized understanding, returned demonstration, verbal cues required, and tactile cues required   HOME EXERCISE PROGRAM   Continue with ongoing HEPs for ROM to the RUE, hand, and digits.    OT Short Term Goals -       OT SHORT TERM GOAL #1   Title Pt and caregiver will be independent with HEP for RUE    Baseline Eval: No current program. 10th visit: Caregivers  report being comfortable with exercises at home. 01/06/2022: Continue ongoing HEPs for UE. 30th visit: Pt./caregivers to continue with ROM; 40th: pt/caregivers to continue with PROM throughout RUE   Time 6   Period Weeks    Status On-going    Target Date 05/16/2022             OT Long Term Goals -       OT LONG TERM GOAL #1   Title Pt. will engage the RUE as a gross assist/atabilizer during ADLs 100% of the time    Baseline 04/04/2022: Pt. Is using her right hand to hold the loofa, and  stabilize items.30th visit: Pt. is initiating using her RUE as a gross stabilizer with maxA 01/06/2022: Pt. is starting to engage her RUE as a gross stabiliizer with increased cues. Eval: pt. does not currently engage her RUE during ADLs. 10th visit: Pt. continues to need to work towards using her RUE as a gross stabilizer.    Time 12    Status On-going    Target Date 06/27/22      OT LONG TERM GOAL #2   Title Pt. will perform UE dressing using one armed dressing techniques    Baseline Eval: MaxA, 10th visit: ModA 01/06/2022: min-modA 30th visit; min0-modA, 04/04/2022: minA donning loose shirts, MaxA tighter items/sports bra, independent doffing a shirt and bra    Time 12    Period Weeks    Status On-going    Target Date 06/27/22      OT LONG TERM GOAL #3   Title Pt. will require minA to stand and hike pants    Baseline Eval: MaxA, 10th visit: ModA 01/06/2022: ModA 30th  visit: ModA 04/04/2022: independent with elastic pants if above the knees. MaxA if they fall below the knee; 04/07/22: min guard-min A to reach below knees to simulate hiking pants   Time 12    Period Weeks    Status On-going    Target Date 06/27/22      OT LONG TERM GOAL #4   Title Pt. will perform LE dressing with minA    Baseline Eval: MaxA 10th visit: Pt. requires MaxA for donning her brace, shoes, and socks. ModA with pants. 01/06/2022: ModA pants, MaxA shoes, socks, and brace 30th visit: ModA pants, MaxA shoes, socks, and brace. 04/04/2022: MaxA; 04/07/22: pt can don/doff L sock and shoe with supv, dep for RLE; see above for hiking pants   Time 12    Period Weeks    Status On-going    Target Date 06/27/22      OT LONG TERM GOAL #5   Title Pt. will improve FOTO score by 2 pt. to reflect funcational change    Baseline Eval: FOTO score: 12, 10th visit: FOTO score 27 01/06/2022: 27 30th visit TBA at next visit secondary to no family available during the session; 04/04/22: FOTO 40   Time 12    Period Weeks    Status On-going     Target Date 06/27/22      OT LONG TERM GOAL #6   Title Patient will transfer to bedside commode or toilet with supervision.    Baseline 30th visit: min-modA. 01/06/2022: min-ModA 04/04/2022: Min-ModA   Time 4    Period Weeks    Status On-going    Target Date 06/27/22        OT LONG TERM GOAL #7                                                      Title: Pt. Will improve AROM wrist extension by 5 degrees in preparation for improving functional  reaching.                                                                                  Baseline:       7/03: -28(72)            Time: 4           Period: Weeks           Status: Ongoing           Target Date: 06/27/2022   Plan - 03/24/22 1429     Clinical Impression Statement Pt. continues to require increased verbal, and tactile cues to perform self-ROM to the right shoulder. Pt. Required fewer cues for ROM to the right wrist. Pt. Required tactile, and verbal cues for soft tissue massage to the right forearm. Pt. continues to present with flexor tone, and tightness through the RUE, wrist, and digits, however was able to achieve increased PROM in the right shoulder, and active ROM for reps of wrist extension. Pt. continues to respond well to the session with less tone,  tightness, and increased ROM noted throughout the RUE following ROM, and manual therapy. Pt. presented with increased tightness with right shoulder abduction, and flexion. Pt. continues to work on normalizing tone, and facilitating active volitional movement in the RUE, and hand to improve engagement of the RUE during ADLs, as well as improve transfers, and functional mobility for toileting.                             OT Occupational Profile and History Detailed Assessment- Review of Records and additional review of physical, cognitive, psychosocial history related to current functional performance    Occupational performance deficits (Please refer to evaluation for details):  Leisure;IADL's;ADL's    Body Structure / Function / Physical Skills ADL;IADL;FMC;ROM;UE functional use;Strength;Decreased knowledge of use of DME;Coordination    Rehab Potential Fair    Clinical Decision Making Several treatment options, min-mod task modification necessary    Comorbidities Affecting Occupational Performance: May have comorbidities impacting occupational performance    Modification or Assistance to Complete Evaluation  Min-Moderate modification of tasks or assist with assess necessary to complete eval    OT Frequency 2x / week    OT Duration 12 weeks    OT Treatment/Interventions Self-care/ADL training;Neuromuscular education;Electrical Stimulation;Patient/family education;Therapeutic activities;Passive range of motion;Therapist, nutritional;Therapeutic exercise;DME and/or AE instruction;Moist Heat    Consulted and Agree with Plan of Care Patient             Harrel Carina, MS, OTR/L   Harrel Carina, OT 05/23/2022, 12:06 PM

## 2022-05-26 ENCOUNTER — Ambulatory Visit: Payer: Medicare Other | Admitting: Occupational Therapy

## 2022-05-26 ENCOUNTER — Ambulatory Visit: Payer: Medicare Other

## 2022-05-26 DIAGNOSIS — R2681 Unsteadiness on feet: Secondary | ICD-10-CM

## 2022-05-26 DIAGNOSIS — M6281 Muscle weakness (generalized): Secondary | ICD-10-CM | POA: Diagnosis not present

## 2022-05-26 DIAGNOSIS — R278 Other lack of coordination: Secondary | ICD-10-CM

## 2022-05-26 NOTE — Therapy (Signed)
OUTPATIENT OCCUPATIONAL THERAPY TREATMENT NOTE   Patient Name: Reniyah Burgo MRN: [id number] DOB:Aug 26, 1956, 66 y.o., female Today's Date: 05/26/2022   REFERRING PROVIDER: Karie Fetch, MD   OT End of Session - 05/26/22 1249     Visit Number 53    Number of Visits 72    Date for OT Re-Evaluation 06/27/22    Authorization Type Progress report period starting 02/17/22    OT Start Time 1100    OT Stop Time 1145    OT Time Calculation (min) 45 min    Activity Tolerance Patient tolerated treatment well    Behavior During Therapy WFL for tasks assessed/performed               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) 09/24/2021   Cerebral embolism with cerebral infarction 09/23/2021   Pyelonephritis 09/19/2021   Pain due to onychomycosis of toenails of both feet 11/19/2020   Normocytic anemia 05/31/2020   Acute ischemic left MCA stroke (HCC) 04/30/2020   Right hemiplegia (HCC) 04/30/2020   Cerebrovascular accident (HCC) 04/21/2020   Atrial fibrillation with RVR (HCC) 04/21/2020   Essential hypertension 04/21/2020   Alcohol abuse 04/21/2020    RUE Measurements (Taken 04/04/22):   PROM Shoulder flexion:  7/03: 0(108)  5/18:  0(105)  PROM Shoulder abduction: 7/03: 9(935)  5/18: 0(102)  PROM Elbow:   7/03: 0(0-140)  5/18: 0(0-136)  AROM Wrist extension:  7/03: -70(17)  5/18: -40(72)  REFERRING DIAG: CVA  THERAPY DIAG:  Muscle weakness (generalized)  Rationale for Evaluation and Treatment Rehabilitation  PERTINENT HISTORY: Pt. is a 66 y.o. 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 Pt. has Right sided weakness, and receptive, and expressive aphasia. Pt. has had a previous CVA with right sided weakness in July 2021. PMHx includes: AFib, Renal Artery thrombosis, situational depression, HTN, Pyelnephritis, Seizure activity, COnstipation, AKI. Vitamin D deficiency, and urinary incontinence/  PRECAUTIONS: Fall   SUBJECTIVE: Pt.  Reports no pain today.    PAIN:  Are you having pain? No     OBJECTIVE:   TODAY'S TREATMENT:   Rationale for Evaluation and Treatment Rehabilitation  OT TREATMENT    Self-Care:  Pt. Worked on  one armed UE dressing donning a button down shirt. EMphasis was placed on threading her RUE through the shirt pulling it up to her right shoulder, and reaching around the back of her neck for the collar to prepare for donning it over her left arm.   Manual Therapy:   Pt. tolerated soft tissue massage to the right scapular musculature. Pt. tolerated scapular mobilizations in elevation, depression, abduction, and rotation following  moist heat modality to the right shoulder, wrist, and hand. Pt. tolerated soft tissue mobilizations for radius on ulna, and metacarpal spread stretches to decrease tightness, and prepare the UE, and hand for ROM and therapeutic Ex. Manual therapy was performed independent of, and in preparation for there. Ex. Pt. education was provided about soft tissue massage to the right forearm.   Therapeutic Exercise:   Pt. tolerated PROM through all joint ranges of the RUE, and hand following  moist heat modality, and manual therapy. Pt. worked on reps of active right wrist extension, and right supination with support proximally. Pt. education was provided about opportunities to engage the RUE during daily tasks.   Pt. Was able to consistently thread her RUE through the shirt using her LUE to assist. Pt. Required increased time, cues to reach around the back of her neck for the shirt collar to bring it to the left shoulder. Pt. Improved with each trial. Pt. continues to require increased verbal, and tactile cues to perform self-ROM to the right shoulder. Pt. Required fewer cues for ROM to the right wrist. Pt. Required tactile, and verbal cues for soft tissue massage to the right forearm. Pt. continues to present with flexor tone, and tightness through the RUE, wrist, and digits, however was able to achieve increased PROM in the right shoulder, and active ROM for reps of wrist extension. Pt. continues to respond well to the session with less tone, tightness, and increased ROM noted throughout the RUE following ROM, and manual therapy. Pt. presented with increased tightness with right shoulder abduction, and flexion. Pt. continues to work on normalizing tone, and facilitating active volitional movement in the RUE, and hand to improve engagement of the RUE during ADLs, as well as improve transfers, and functional mobility for toileting.         PATIENT EDUCATION: Education details: dressing techniques, HEP Person educated: Patient Education method: Customer service manager Education comprehension: verbalized understanding, returned demonstration, verbal cues required, and tactile cues required   [place] with ongoing HEPs for ROM to the RUE, hand, and digits.    OT Short Term Goals -       OT SHORT TERM GOAL #1   Title Pt and caregiver will be independent with HEP for RUE    Baseline Eval: No current program. 10th visit: Caregivers  report being comfortable  with exercises at home. 01/06/2022: Continue ongoing HEPs for UE. 30th visit: Pt./caregivers to continue with ROM; 40th: pt/caregivers to continue with PROM throughout RUE   Time 6   Period Weeks    Status On-going    Target Date 05/16/2022             OT Long Term Goals -       OT LONG TERM GOAL #1   Title Pt. will engage the RUE as a gross assist/atabilizer during ADLs 100% of the time    Baseline 04/04/2022: Pt. Is using her right hand to hold the loofa, and stabilize items.30th visit: Pt. is initiating using her RUE as a gross stabilizer with maxA 01/06/2022: Pt. is starting to engage her RUE as a gross stabiliizer with increased cues. Eval: pt. does not currently engage her RUE during ADLs. 10th visit: Pt. continues to need to work towards using her RUE as a gross stabilizer.    Time 12    Status On-going    Target Date 06/27/22      OT LONG TERM GOAL #2   Title  Pt. will perform UE dressing using one armed dressing techniques    Baseline Eval: MaxA, 10th visit: ModA 01/06/2022: min-modA 30th visit; min0-modA, 04/04/2022: minA donning loose shirts, MaxA tighter items/sports bra, independent doffing a shirt and bra    Time 12    Period Weeks    Status On-going    Target Date 06/27/22      OT LONG TERM GOAL #3   Title Pt. will require minA to stand and hike pants    Baseline Eval: MaxA, 10th visit: ModA 01/06/2022: ModA 30th visit: ModA 04/04/2022: independent with elastic pants if above the knees. MaxA if they fall below the knee; 04/07/22: min guard-min A to reach below knees to simulate hiking pants   Time 12    Period Weeks    Status On-going    Target Date 06/27/22      OT LONG TERM GOAL #4   Title Pt. will perform LE dressing with minA    Baseline Eval: MaxA 10th visit: Pt. requires MaxA for donning her brace, shoes, and socks. ModA with pants. 01/06/2022: ModA pants, MaxA shoes, socks, and brace 30th visit: ModA pants, MaxA shoes, socks, and brace. 04/04/2022: MaxA; 04/07/22: pt can  don/doff L sock and shoe with supv, dep for RLE; see above for hiking pants   Time 12    Period Weeks    Status On-going    Target Date 06/27/22      OT LONG TERM GOAL #5   Title Pt. will improve FOTO score by 2 pt. to reflect funcational change    Baseline Eval: FOTO score: 12, 10th visit: FOTO score 27 01/06/2022: 27 30th visit TBA at next visit secondary to no family available during the session; 04/04/22: FOTO 40   Time 12    Period Weeks    Status On-going    Target Date 06/27/22      OT LONG TERM GOAL #6   Title Patient will transfer to bedside commode or toilet with supervision.    Baseline 30th visit: min-modA. 01/06/2022: min-ModA 04/04/2022: Min-ModA   Time 4    Period Weeks    Status On-going    Target Date 06/27/22        OT LONG TERM GOAL #7                                                      Title: Pt. Will improve AROM wrist extension by 5 degrees in preparation for improving functional  reaching.                                                                                  Baseline:       7/03: -28(72)            Time: 4           Period: Weeks           Status: Ongoing           Target Date: 06/27/2022   Plan -  03/24/22 1429     Clinical Impression Statement Pt. Was able to consistently thread her RUE through the shirt using her LUE to assist. Pt. Required increased time, cues to reach around the back of her neck for the shirt collar to bring it to the left shoulder. Pt. Improved with each trial. Pt. continues to require increased verbal, and tactile cues to perform self-ROM to the right shoulder. Pt. Required fewer cues for ROM to the right wrist. Pt. Required tactile, and verbal cues for soft tissue massage to the right forearm. Pt. continues to present with flexor tone, and tightness through the RUE, wrist, and digits, however was able to achieve increased PROM in the right shoulder, and active ROM for reps of wrist extension. Pt. continues to respond well to the  session with less tone, tightness, and increased ROM noted throughout the RUE following ROM, and manual therapy. Pt. presented with increased tightness with right shoulder abduction, and flexion. Pt. continues to work on normalizing tone, and facilitating active volitional movement in the RUE, and hand to improve engagement of the RUE during ADLs, as well as improve transfers, and functional mobility for toileting.                        OT Occupational Profile and History Detailed Assessment- Review of Records and additional review of physical, cognitive, psychosocial history related to current functional performance    Occupational performance deficits (Please refer to evaluation for details): Leisure;IADL's;ADL's    Body Structure / Function / Physical Skills ADL;IADL;FMC;ROM;UE functional use;Strength;Decreased knowledge of use of DME;Coordination    Rehab Potential Fair    Clinical Decision Making Several treatment options, min-mod task modification necessary    Comorbidities Affecting Occupational Performance: May have comorbidities impacting occupational performance    Modification or Assistance to Complete Evaluation  Min-Moderate modification of tasks or assist with assess necessary to complete eval    OT Frequency 2x / week    OT Duration 12 weeks    OT Treatment/Interventions Self-care/ADL training;Neuromuscular education;Electrical Stimulation;Patient/family education;Therapeutic activities;Passive range of motion;Building services engineer;Therapeutic exercise;DME and/or AE instruction;Moist Heat    Consulted and Agree with Plan of Care Patient             Olegario Messier, MS, OTR/L   Olegario Messier, OT 05/26/2022, 12:52 PM

## 2022-05-26 NOTE — Therapy (Signed)
OUTPATIENT PHYSICAL THERAPY TREATMENT NOTE   Patient Name: Lindsey [name] MRN: [id number] DOB:08/08/1956, 66 y.o., female Today's Date: 05/26/2022  PCP: Dr. Tomasa Hose MD REFERRING PROVIDER: Leeroy Cha MD   PT End of Session - 05/26/22 1400     Visit Number 43    Number of Visits 25    Date for PT Re-Evaluation 07/28/22    Authorization Type Medicare    Authorization Time Period Cert through 12/07/62; Recert 4/0/3474-11/07/9561; Recert 05/09/5642-32/95    Progress Note Due on Visit 50    PT Start Time 1032    PT Stop Time 1100    PT Time Calculation (min) 28 min    Equipment Utilized During Treatment Gait belt    Activity Tolerance No increased pain;Patient tolerated treatment well    Behavior During Therapy [facility] for tasks assessed/performed                   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 No pertinent surgical history. 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 ([place]) 09/24/2021   Cerebral embolism with cerebral infarction 09/23/2021   Pyelonephritis 09/19/2021   Pain due to onychomycosis of toenails of both feet 11/19/2020   Normocytic anemia 05/31/2020   Acute ischemic left MCA stroke ([place]) 04/30/2020   Right hemiplegia ([place]) 04/30/2020   Cerebrovascular accident  ([place]) 04/21/2020   Atrial fibrillation with RVR ([place]) 04/21/2020   Essential hypertension 04/21/2020   Alcohol abuse 04/21/2020    REFERRING DIAG: s/p CVA  THERAPY DIAG:  Unsteadiness on feet  Other lack of coordination  Muscle weakness (generalized)  Rationale for Evaluation and Treatment Rehabilitation  PERTINENT HISTORY: Pt was doing very well and was walking short distances wqith a cane prior to second stroke in december. At this point she is limited to wheelchair as her main means of mobility. Pt caregiver reports all detailed information. Pt has hesitation with the left lower extremity and significant weakness on the right lower extremity. Pt will take 2-3 steps when transfering. Pt caregiver assists with all transfers.Pt has shower with ledge to steps over ledge when entering and utilizes bedside commode. Pt has 2 level home with chair lift for upstairs access. Pt would like to improve ransfers, improve neglect on her R side and improve her overall strength and mobility.Pt. is a 66 y.o. 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 Pt. has Right sided weakness, and receptive, and expressive aphasia. Pt. has had a previous CVA with right sided weakness in July 2021. PMHx includes: AFib, Renal Artery thrombosis, situational depression, HTN, Pyelnephritis, Seizure activity, COnstipation, AKI. Vitamin D deficiency, and urinary incontinence.   PRECAUTIONS: fall risk, wearing RLE AFO  SUBJECTIVE: Patient  late to session. Reports no aches/pains, reports no stumbles/falls and no other concerns.    PAIN:  Are you having pain? No      TODAY'S TREATMENT:  05/26/22   INTERVENTIONS:    Therapeutic Exercise:   Amb 8 ft with hemi-walker to chair - mod assist to correct for LOB due to pt attempting lateral approach to sit in chair, very unsteady. Requires further instruction for safe STS technique  STS for safe  technique (UE and LE Positioning) 3x. Cuing throughout, does exhibit improvement by end of session with sit>stand portion, likely requires further education for stand>sit.   Seated: Hamstring curls GTB 2x12, 1x15 LLE only. Reports last set slightly more difficult  Fwd slide and Hamstring curl with pillowcase under foot RLE 2x12 Seated knee ext, 3# AW on left and no weight on R, 2 sets x 12 reps each LE   PATIENT EDUCATION: Education details: Pt educated throughout session about proper posture and technique with exercises. Improved exercise technique, movement at target joints, use of target muscles after min to mod verbal, visual, tactile cues. Continued education on technique with STS.  Person educated: Patient Education method: Explanation, Demonstration, and Verbal cues Education comprehension: verbalized understanding, returned demonstration, and needs further education   HOME EXERCISE PROGRAM: Continue as previously given;    PT Short Term Goals -       PT SHORT TERM GOAL #1   Title Patient will be independent in home exercise program to improve strength/mobility for better functional independence with ADLs.    Baseline no formal HEp for LE/balance at this time; 02/08/2022=Patient continues to require VC for reminders and assist with hip march/knee ext (seated for strengthening); Reminders to continue to work on static stand at home.    Time 4    Period Weeks    Status Partially Met  04/07/22: complete somewhat regularly     Target Date 12/14/21              PT Long Term Goals -       PT LONG TERM GOAL #1   Title Patient will increase FOTO score to equal to or greater than   41  to demonstrate statistically significant improvement in mobility and quality of life.    Baseline 16 on 2/14; 02/08/2022=Patient unable to complete today- No family present but will attempt again next visit available with family present to assist in completion; 02/17/2022= 40    Time 12    Period  Weeks    Status On-going    Target Date 07/28/2022       PT LONG TERM GOAL #3   Title Patient  will complete five times sit to stand test in < 30 seconds with UE assistance indicating an increased LE strength and improved balance.    Baseline 58.47 s with UE assist and with A from PT for R LE positioning; 02/08/2022=54 sec with use of L UE support and Min physical assist to position right LE.  7/6: 30.5 sec, min A for LE positioning, some attempts did not come to full erect posture, did not let go with L UE from armchair with any attempt 05/05/22: 26.65 sec    Time 12    Period Weeks    Status Met    Target Date 05/03/22      PT LONG TERM GOAL #4   Title Patient will increase Berg Balance score by > 6 points to demonstrate decreased fall risk during functional activities.  Baseline 12 on 2/14; 02/08/2022= 13/56  04/07/22: 17  8/3: 19    Time 12    Period Weeks    Status Met    Target Date 05/03/22      PT LONG TERM GOAL #5   Title Pt will transfer from seated position on table/chair to/from transport chair/WC in order to indicate improved transfer ability.    Baseline Requires Min A for LE positioning and to prevent LOB. 02/08/2022=Patient attempted to perform on her own- Unable to push herself up from chair- Very min assist to position right LE into more flexed position and VC for hand placement. Patient able to stand with min Assist with use of gait belt and VC to lean; 02/17/2022=Patient was able to transfer from transport w/c with some physical assist with Right to swing to pivot to side of chair. She was able to sit to stand today with only CGA and then stand pivot with right arm holding onto arm of chair. Only difficulty was stand to sit- Max VC not to plop into chair. 04/07/22: Pt performed movement of B LE to side of chair independently with chair transfer. Pt required min A with R LE foot placement of floor. Pt requires increased time to complete but complete with CGA only for safety and  no cues or A to prevent falling/ plopping into armchair.  8/3: Pt requires min A for maintenance of standig balance, able to navigate from transport chairr to arm chair with hemiwalker, does not controll descent with ahands still " flops" into chair    Time 12    Period Weeks    Status Partially met    Target Date 07/28/2022       PT LONG TERM GOAL #6   Title Patient will ambulate > 150 feet with CGA using Hemiwalker on level surfaces for improved household and short community distances.    Baseline 02/08/2022= 40 feet with use of HW, heavy CGA with max VC for gait sequencing and w/c follow. 5/18 = 85 feet with use of HW, close CGA  7/6: 45 feet, heavy use of hemiwalker 8/3: 85 feet with heavy hemi walker use, no Lob throughout, cues for appropriate weight distribution    Time 12    Period Weeks    Status On-going    Target Date 07/28/2022        PT LONG TERM GOAL #7   Title Pt will increase 10MWT by at least 0.13 m/s in order to demonstrate clinically significant improvement in community ambulation.    Baseline 02/08/2022= 0.05 m/s using HW 7/6: .028ms with heavy ue use of hemiwalker and CGA for balance 8/3: .0569m   Time 12    Period Weeks    Status Ongoing     Target Date 07/28/2022      8.  Pt will improve 5X STS with full knee extension in standing each rep and with appropriate eccentric control in order to indicate improved transfer efficacy and improved LE strength  Baseline: 52.45 sec with UE support Goal status: INITIAL  9.  Patient will increase Berg Balance score by > 6 points to demonstrate decreased fall risk during functional activities Baseline: 19 Goal status: INITIAL           Plan -     Clinical Impression Statement Session limited secondary to pt late arrival. However, pt highly motivated to participate. Pt still exhibits unsteadiness and incorrect technique when attempting STS. She did require mod assist to correct for LOB  at beginning of session when  pt attempted lateral approach into chair. She did improve STS technique within session following instruction, but will likely require further review. Pt will continue to benefit from skilled physical therapy intervention to address impairments, improve QOL, and attain therapy goals.       Personal Factors and Comorbidities Age;Comorbidity 1;Comorbidity 2;Comorbidity 3+;Time since onset of injury/illness/exacerbation;Other    Comorbidities AFib, Renal Artery thrombosis, situational depression, HTN, Pyelnephritis, Seizure activity, COnstipation, AKI    Examination-Activity Limitations Bathing;Bed Mobility;Carry;Locomotion Level;Dressing;Sit;Squat;Stairs;Stand;Toileting;Transfers    Examination-Participation Restrictions Church;Community Activity;Laundry    Stability/Clinical Decision Making Evolving/Moderate complexity    Rehab Potential Fair    PT Frequency 2x / week    PT Duration 12 weeks    PT Treatment/Interventions ADLs/Self Care Home Management;Gait training;Stair training;Therapeutic activities;Therapeutic exercise;Balance training;Neuromuscular re-education;Patient/family education;Wheelchair mobility training;Manual techniques;Energy conservation;Dry needling;Passive range of motion;Taping;Spinal Manipulations;Joint Manipulations    PT Next Visit Plan Gait training, Transfer training, LE strengthening as appropriate.    PT Home Exercise Plan HEP: general LE and postural exercises    Consulted and Agree with Plan of Care Patient            Ricard Dillon PT, DPT  05/26/22, 2:11 PM

## 2022-05-30 ENCOUNTER — Encounter: Payer: Self-pay | Admitting: Occupational Therapy

## 2022-05-30 ENCOUNTER — Ambulatory Visit: Payer: Medicare Other | Admitting: Occupational Therapy

## 2022-05-30 ENCOUNTER — Ambulatory Visit: Payer: Medicare Other

## 2022-05-30 DIAGNOSIS — R262 Difficulty in walking, not elsewhere classified: Secondary | ICD-10-CM

## 2022-05-30 DIAGNOSIS — R2681 Unsteadiness on feet: Secondary | ICD-10-CM

## 2022-05-30 DIAGNOSIS — M6281 Muscle weakness (generalized): Secondary | ICD-10-CM | POA: Diagnosis not present

## 2022-05-30 DIAGNOSIS — R2689 Other abnormalities of gait and mobility: Secondary | ICD-10-CM

## 2022-05-30 DIAGNOSIS — R269 Unspecified abnormalities of gait and mobility: Secondary | ICD-10-CM

## 2022-05-30 DIAGNOSIS — R278 Other lack of coordination: Secondary | ICD-10-CM

## 2022-05-30 DIAGNOSIS — I63542 Cerebral infarction due to unspecified occlusion or stenosis of left cerebellar artery: Secondary | ICD-10-CM

## 2022-05-30 NOTE — Therapy (Signed)
OUTPATIENT PHYSICAL THERAPY TREATMENT NOTE   Patient Name: Lindsey [name] MRN: [id number] DOB:04-08-1956, 66 y.o., female Today's Date: 05/30/2022  PCP: Dr. Tomasa Hose MD REFERRING PROVIDER: Leeroy Cha MD   PT End of Session - 05/30/22 0940     Visit Number 44    Number of Visits 24    Date for PT Re-Evaluation 07/28/22    Authorization Type Medicare    Authorization Time Period Cert through 03/06/32; Recert 11/11/5186-01/01/6605; Recert 3/0/1601-09/32    Progress Note Due on Visit 50    PT Start Time 0930    PT Stop Time 1012    PT Time Calculation (min) 42 min    Equipment Utilized During Treatment Gait belt    Activity Tolerance No increased pain;Patient tolerated treatment well    Behavior During Therapy [facility] for tasks assessed/performed                   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 No pertinent surgical history. 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 ([place]) 09/24/2021   Cerebral embolism with cerebral infarction 09/23/2021   Pyelonephritis 09/19/2021   Pain due to onychomycosis of toenails of both feet 11/19/2020   Normocytic anemia 05/31/2020   Acute ischemic left MCA stroke ([place]) 04/30/2020   Right hemiplegia ([place]) 04/30/2020   Cerebrovascular accident  ([place]) 04/21/2020   Atrial fibrillation with RVR ([place]) 04/21/2020   Essential hypertension 04/21/2020   Alcohol abuse 04/21/2020    REFERRING DIAG: s/p CVA  THERAPY DIAG:  Unsteadiness on feet  Muscle weakness (generalized)  Other lack of coordination  Difficulty in walking, not elsewhere classified  Abnormality of gait and mobility  Other abnormalities of gait and mobility  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 At this point she is limited to wheelchair as her main means of mobility. Pt caregiver reports all detailed information. Pt has hesitation with the left lower extremity and significant weakness on the right lower extremity. Pt will take 2-3 steps when transfering. Pt caregiver assists with all transfers.Pt has shower with ledge to steps over ledge when entering and utilizes bedside commode. Pt has 2 level home with chair lift for upstairs access. Pt would like to improve ransfers, improve neglect on her R side and improve her overall strength and mobility.Pt. is a 66 y.o. 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 Pt. has Right sided weakness, and receptive, and expressive aphasia. Pt. has had a previous CVA with right sided weakness  in July 2021. PMHx includes: AFib, Renal Artery thrombosis, situational depression, HTN, Pyelnephritis, Seizure activity, COnstipation, AKI. Vitamin D deficiency, and urinary incontinence.   PRECAUTIONS: fall risk, wearing RLE AFO  SUBJECTIVE: Patient reports having a good and restful weekend  PAIN:  Are you having pain? No      TODAY'S TREATMENT:  05/30/22   INTERVENTIONS:    Therapeutic Exercise:   Amb  in // bars - down and  back x 3 with left UE on railing, CGA and VC to stand erect and look forward. Patient performed well with walking yet struggled with turning and advancing right LE- continued VC for instruction for safe STS technique  STS for safe technique (UE and LE Positioning) x 10. Initially max VC- Patient did attempt to scoot without prompting but continued VC to not reach for bars and push up from arm rest- Did improve with practice but point of focus for safety.   Static stand on right LE in // bars while step tap with left LE (and Left UE support) x 20 reps  Static stand on left LE in // bars while attempting to step tap with right LE x 10 (increased difficulty requiring increased time but did complete)    Seated: Seated knee ext- 1 set x 12 reps each LE   PATIENT EDUCATION: Education details: Pt educated throughout session about proper posture and technique with exercises. Improved exercise technique, movement at target joints, use of target muscles after min to mod verbal, visual, tactile cues. Continued education on technique with STS.  Person educated: Patient Education method: Explanation, Demonstration, and Verbal cues Education comprehension: verbalized understanding, returned demonstration, and needs further education   HOME EXERCISE PROGRAM: Continue as previously given;    PT Short Term Goals -       PT SHORT TERM GOAL #1   Title Patient will be independent in home exercise program to improve strength/mobility for better functional independence with ADLs.    Baseline no formal HEp for LE/balance at this time; 02/08/2022=Patient continues to require VC for reminders and assist with hip march/knee ext (seated for strengthening); Reminders to continue to work on static stand at home.    Time 4    Period Weeks    Status Partially Met  04/07/22: complete somewhat regularly     Target Date 12/14/21              PT Long Term Goals -       PT LONG TERM GOAL #1   Title Patient  will increase FOTO score to equal to or greater than   41  to demonstrate statistically significant improvement in mobility and quality of life.    Baseline 16 on 2/14; 02/08/2022=Patient unable to complete today- No family present but will attempt again next visit available with family present to assist in completion; 02/17/2022= 40    Time 12    Period Weeks    Status On-going    Target Date 07/28/2022       PT LONG TERM GOAL #3   Title Patient  will complete five times sit to stand test in < 30 seconds with UE assistance indicating an increased LE strength and improved balance.    Baseline 58.47 s with UE assist and with A from PT for R LE positioning; 02/08/2022=54 sec with use of L UE support and Min physical assist to position right LE.  7/6: 30.5 sec, min A for LE positioning, some attempts did not come to  full erect posture, did not let go with L UE from armchair with any attempt 05/05/22: 26.65 sec    Time 12    Period Weeks    Status Met    Target Date 05/03/22      PT LONG TERM GOAL #4   Title Patient will increase Berg Balance score by > 6 points to demonstrate decreased fall risk during functional activities.    Baseline 12 on 2/14; 02/08/2022= 13/56  04/07/22: 17  8/3: 19    Time 12    Period Weeks    Status Met    Target Date 05/03/22      PT LONG TERM GOAL #5   Title Pt will transfer from seated position on table/chair to/from transport chair/WC in order to indicate improved transfer ability.    Baseline Requires Min A for LE positioning and to prevent LOB. 02/08/2022=Patient attempted to perform on her own- Unable to push herself up from chair- Very min assist to position right LE into more flexed position and VC for hand placement. Patient able to stand with min Assist with use of gait belt and VC to lean; 02/17/2022=Patient was able to transfer from transport w/c with some physical assist with Right to swing to pivot to side of chair. She was able to sit to stand today with only  CGA and then stand pivot with right arm holding onto arm of chair. Only difficulty was stand to sit- Max VC not to plop into chair. 04/07/22: Pt performed movement of B LE to side of chair independently with chair transfer. Pt required min A with R LE foot placement of floor. Pt requires increased time to complete but complete with CGA only for safety and no cues or A to prevent falling/ plopping into armchair.  8/3: Pt requires min A for maintenance of standig balance, able to navigate from transport chairr to arm chair with hemiwalker, does not controll descent with ahands still " flops" into chair    Time 12    Period Weeks    Status Partially met    Target Date 07/28/2022       PT LONG TERM GOAL #6   Title Patient will ambulate > 150 feet with CGA using Hemiwalker on level surfaces for improved household and short community distances.    Baseline 02/08/2022= 40 feet with use of HW, heavy CGA with max VC for gait sequencing and w/c follow. 5/18 = 85 feet with use of HW, close CGA  7/6: 45 feet, heavy use of hemiwalker 8/3: 85 feet with heavy hemi walker use, no Lob throughout, cues for appropriate weight distribution    Time 12    Period Weeks    Status On-going    Target Date 07/28/2022        PT LONG TERM GOAL #7   Title Pt will increase 10MWT by at least 0.13 m/s in order to demonstrate clinically significant improvement in community ambulation.    Baseline 02/08/2022= 0.05 m/s using HW 7/6: .080ms with heavy ue use of hemiwalker and CGA for balance 8/3: .0579m   Time 12    Period Weeks    Status Ongoing     Target Date 07/28/2022      8.  Pt will improve 5X STS with full knee extension in standing each rep and with appropriate eccentric control in order to indicate improved transfer efficacy and improved LE strength  Baseline: 52.45 sec with UE support Goal status: INITIAL  9.  Patient  will increase Berg Balance score by > 6 points to demonstrate decreased fall risk during  functional activities Baseline: 19 Goal status: INITIAL           Plan -     Clinical Impression Statement Patient continues to be inconsistent with ability to sit to stand - did improve with VC and some physical assist with right LE (positioning). At times, Patient able to scoot, lean and position left UE correctly then at the last second reach for something to pull up rather than push. She will benefit from STS training with nothing in front to remind patient to push up from chair. She also had more difficulty turning in // bars with some difficulty advancing/controlling right LE today.  Pt will continue to benefit from skilled physical therapy intervention to address impairments, improve QOL, and attain therapy goals.       Personal Factors and Comorbidities Age;Comorbidity 1;Comorbidity 2;Comorbidity 3+;Time since onset of injury/illness/exacerbation;Other    Comorbidities AFib, Renal Artery thrombosis, situational depression, HTN, Pyelnephritis, Seizure activity, COnstipation, AKI    Examination-Activity Limitations Bathing;Bed Mobility;Carry;Locomotion Level;Dressing;Sit;Squat;Stairs;Stand;Toileting;Transfers    Examination-Participation Restrictions Church;Community Activity;Laundry    Stability/Clinical Decision Making Evolving/Moderate complexity    Rehab Potential Fair    PT Frequency 2x / week    PT Duration 12 weeks    PT Treatment/Interventions ADLs/Self Care Home Management;Gait training;Stair training;Therapeutic activities;Therapeutic exercise;Balance training;Neuromuscular re-education;Patient/family education;Wheelchair mobility training;Manual techniques;Energy conservation;Dry needling;Passive range of motion;Taping;Spinal Manipulations;Joint Manipulations    PT Next Visit Plan Gait training, Transfer training, LE strengthening as appropriate.    PT Home Exercise Plan HEP: general LE and postural exercises    Consulted and Agree with Plan of Care Patient             Ollen Bowl, PT  05/30/22, 2:47 PM

## 2022-05-30 NOTE — Therapy (Addendum)
OUTPATIENT OCCUPATIONAL THERAPY TREATMENT NOTE   Patient Name: Lindsey [name] MRN: [id number] DOB:1956/01/26, 66 y.o., female Today's Date: 05/30/2022   REFERRING PROVIDER: Karie Fetch, MD   OT End of Session - 05/30/22 1310     Visit Number 54    Number of Visits 72    Date for OT Re-Evaluation 06/27/22    Authorization Type Progress report period starting 02/17/22    OT Start Time 1015    OT Stop Time 1100    OT Time Calculation (min) 45 min    Equipment Utilized During Treatment transport chair    Activity Tolerance Patient tolerated treatment well    Behavior During Therapy WFL for tasks assessed/performed               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 No pertinent surgical history. 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) 09/24/2021   Cerebral embolism with cerebral infarction 09/23/2021   Pyelonephritis 09/19/2021   Pain due to onychomycosis of toenails of both feet 11/19/2020   Normocytic anemia 05/31/2020   Acute ischemic left MCA stroke (HCC) 04/30/2020   Right hemiplegia (HCC) 04/30/2020   Cerebrovascular accident (HCC) 04/21/2020   Atrial fibrillation with RVR (HCC) 04/21/2020   Essential hypertension 04/21/2020   Alcohol abuse 04/21/2020    RUE  Measurements (Taken 04/04/22):   PROM Shoulder flexion:  7/03: 0(108)  5/18: 0(105)  PROM Shoulder abduction: 7/03: 5(462)  5/18: 0(102)  PROM Elbow:   7/03: 0(0-140)  5/18: 0(0-136)  AROM Wrist extension:  7/03: -70(35)  5/18: -40(72)  REFERRING DIAG: CVA  THERAPY DIAG:  Muscle weakness (generalized)  Rationale for Evaluation and Treatment Rehabilitation  PERTINENT HISTORY: Pt. is a 66 y.o. 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 Pt. has Right sided weakness, and receptive, and expressive aphasia. Pt. has had a previous CVA with right sided weakness in July 2021. PMHx includes: AFib, Renal Artery thrombosis, situational depression, HTN, Pyelnephritis, Seizure activity, COnstipation, AKI. Vitamin D deficiency, and urinary incontinence/  PRECAUTIONS: Fall   SUBJECTIVE: Pt. Reports doing well today.   PAIN:  Are you having pain? No     OBJECTIVE:   TODAY'S TREATMENT:   Rationale for Evaluation and Treatment Rehabilitation   OT TREATMENT    Self-Care:  Pt. Worked on  one armed UE dressing donning a button down shirt. Emphasis was placed on threading her RUE through the shirt pulling it up to her right shoulder, and reaching around the back of her neck for the collar to prepare for donning it over her left arm.   Manual Therapy:   Pt. tolerated soft tissue massage to the right scapular musculature. Pt. tolerated scapular  mobilizations in elevation, depression, abduction, and rotation following moist heat modality to the right shoulder, wrist, and hand. Pt. tolerated soft tissue mobilizations for radius on ulna, and metacarpal spread stretches to decrease tightness, and prepare the UE, and hand for ROM and therapeutic Ex. Manual therapy was performed independent of, and in preparation for there. Ex. Pt. education was provided about soft tissue massage to the right forearm.   Therapeutic  Exercise:   Pt. tolerated PROM through all joint ranges of the RUE, and hand following moist heat modality, and manual therapy. Pt. worked on reps of active right wrist extension, and right supination with support proximally. Pt. education was provided about opportunities to engage the RUE during daily tasks.   Pt. Required assist for set-up, and to initiate threading her RUE through the shirt using her LUE to assist. Pt. required cues to reach around the back of her neck for the shirt collar to bring it to the left shoulder, however required less time to complete. Pt. Improved with each trial. Pt. continues to require increased verbal, and tactile cues to perform self-ROM to the right shoulder. Pt. Required fewer cues for ROM to the right wrist. Pt. Required tactile, and verbal cues for soft tissue massage to the right forearm. Pt. continues to present with flexor tone, and tightness through the RUE, wrist, and digits, however was able to achieve increased PROM in the right shoulder, and active ROM for reps of wrist extension. Pt. continues to respond well to the session with less tone, tightness, and increased ROM noted throughout the RUE following ROM, and manual therapy. Pt. presented with increased tightness with right shoulder abduction, and flexion. Pt. continues to work on normalizing tone, and facilitating active volitional movement in the RUE, and hand to improve engagement of the RUE during ADLs, as well as improve transfers, and functional mobility for toileting.      PATIENT EDUCATION: Education details: dressing techniques, HEP Person educated: Patient Education method: Medical illustrator Education comprehension: verbalized understanding, returned demonstration, verbal cues required, and tactile cues required   HOME EXERCISE PROGRAM   Continue with ongoing HEPs for ROM to the RUE, hand, and digits.    OT Short Term Goals -       OT SHORT TERM GOAL #1   Title Pt and  caregiver will be independent with HEP for RUE    Baseline Eval: No current program. 10th visit: Caregivers  report being comfortable with exercises at home. 01/06/2022: Continue ongoing HEPs for UE. 30th visit: Pt./caregivers to continue with ROM; 40th: pt/caregivers to continue with PROM throughout RUE   Time 6   Period Weeks    Status On-going    Target Date 05/16/2022             OT Long Term Goals -       OT LONG TERM GOAL #1   Title Pt. will engage the RUE as a gross assist/atabilizer during ADLs 100% of the time    Baseline 04/04/2022: Pt. Is using her right hand to hold the loofa, and stabilize items.30th visit: Pt. is initiating using her RUE as a gross stabilizer with maxA 01/06/2022: Pt. is starting to engage her RUE as a gross stabiliizer with increased cues. Eval: pt. does not currently engage her RUE during ADLs. 10th visit: Pt. continues to need to work towards using her RUE as a gross stabilizer.    Time 12    Status On-going    Target Date 06/27/22  OT LONG TERM GOAL #2   Title Pt. will perform UE dressing using one armed dressing techniques    Baseline Eval: MaxA, 10th visit: [place] 01/06/2022: min-modA 30th visit; min0-modA, 04/04/2022: minA donning loose shirts, MaxA tighter items/sports bra, independent doffing a shirt and bra    Time 12    Period Weeks    Status On-going    Target Date 06/27/22      OT LONG TERM GOAL #3   Title Pt. will require minA to stand and hike pants    Baseline Eval: MaxA, 10th visit: [place] 01/06/2022: [place] 30th visit: [place] 04/04/2022: independent with elastic pants if above the knees. MaxA if they fall below the knee; 04/07/22: min guard-min A to reach below knees to simulate hiking pants   Time 12    Period Weeks    Status On-going    Target Date 06/27/22      OT LONG TERM GOAL #4   Title Pt. will perform LE dressing with minA    Baseline Eval: MaxA 10th visit: Pt. requires MaxA for donning her brace, shoes, and socks. ModA with pants.  01/06/2022: ModA pants, MaxA shoes, socks, and brace 30th visit: ModA pants, MaxA shoes, socks, and brace. 04/04/2022: MaxA; 04/07/22: pt can don/doff L sock and shoe with supv, dep for RLE; see above for hiking pants   Time 12    Period Weeks    Status On-going    Target Date 06/27/22      OT LONG TERM GOAL #5   Title Pt. will improve FOTO score by 2 pt. to reflect funcational change    Baseline Eval: FOTO score: 12, 10th visit: FOTO score 27 01/06/2022: 27 30th visit TBA at next visit secondary to no family available during the session; 04/04/22: FOTO 40   Time 12    Period Weeks    Status On-going    Target Date 06/27/22      OT LONG TERM GOAL #6   Title Patient will transfer to bedside commode or toilet with supervision.    Baseline 30th visit: min-modA. 01/06/2022: min-ModA 04/04/2022: Min-ModA   Time 4    Period Weeks    Status On-going    Target Date 06/27/22        OT LONG TERM GOAL #7                                                      Title: Pt. Will improve AROM wrist extension by 5 degrees in preparation for improving functional  reaching.                                                                                  Baseline:       7/03: -28(72)            Time: 4           Period: Weeks           Status: Ongoing  Target Date: 06/27/2022   Plan - 03/24/22 1429     Clinical Impression Statement Pt. Required assist for set-up, and to initiate threading her RUE through the shirt using her LUE to assist. Pt. required cues to reach around the back of her neck for the shirt collar to bring it to the left shoulder, however required less time to complete. Pt. Improved with each trial. Pt. continues to require increased verbal, and tactile cues to perform self-ROM to the right shoulder. Pt. Required fewer cues for ROM to the right wrist. Pt. Required tactile, and verbal cues for soft tissue massage to the right forearm. Pt. continues to present with flexor tone, and tightness  through the RUE, wrist, and digits, however was able to achieve increased PROM in the right shoulder, and active ROM for reps of wrist extension. Pt. continues to respond well to the session with less tone, tightness, and increased ROM noted throughout the RUE following ROM, and manual therapy. Pt. presented with increased tightness with right shoulder abduction, and flexion. Pt. continues to work on normalizing tone, and facilitating active volitional movement in the RUE, and hand to improve engagement of the RUE during ADLs, as well as improve transfers, and functional mobility for toileting.                           OT Occupational Profile and History Detailed Assessment- Review of Records and additional review of physical, cognitive, psychosocial history related to current functional performance    Occupational performance deficits (Please refer to evaluation for details): Leisure;IADL's;ADL's    Body Structure / Function / Physical Skills ADL;IADL;FMC;ROM;UE functional use;Strength;Decreased knowledge of use of DME;Coordination    Rehab Potential Fair    Clinical Decision Making Several treatment options, min-mod task modification necessary    Comorbidities Affecting Occupational Performance: May have comorbidities impacting occupational performance    Modification or Assistance to Complete Evaluation  Min-Moderate modification of tasks or assist with assess necessary to complete eval    OT Frequency 2x / week    OT Duration 12 weeks    OT Treatment/Interventions Self-care/ADL training;Neuromuscular education;Electrical Stimulation;Patient/family education;Therapeutic activities;Passive range of motion;Therapist, nutritional;Therapeutic exercise;DME and/or AE instruction;Moist Heat    Consulted and Agree with Plan of Care Patient             Harrel Carina, MS, OTR/L   Harrel Carina, OT 05/30/2022, 1:15 PM

## 2022-06-02 ENCOUNTER — Encounter: Payer: Self-pay | Admitting: Physical Therapy

## 2022-06-02 ENCOUNTER — Encounter
Payer: Medicare Other | Attending: Physical Medicine and Rehabilitation | Admitting: Physical Medicine and Rehabilitation

## 2022-06-02 ENCOUNTER — Ambulatory Visit: Payer: Medicare Other | Admitting: Occupational Therapy

## 2022-06-02 ENCOUNTER — Ambulatory Visit: Payer: Medicare Other | Admitting: Physical Therapy

## 2022-06-02 VITALS — BP 162/110 | HR 77

## 2022-06-02 DIAGNOSIS — R2681 Unsteadiness on feet: Secondary | ICD-10-CM

## 2022-06-02 DIAGNOSIS — R269 Unspecified abnormalities of gait and mobility: Secondary | ICD-10-CM

## 2022-06-02 DIAGNOSIS — M6281 Muscle weakness (generalized): Secondary | ICD-10-CM | POA: Diagnosis not present

## 2022-06-02 DIAGNOSIS — I69398 Other sequelae of cerebral infarction: Secondary | ICD-10-CM | POA: Insufficient documentation

## 2022-06-02 DIAGNOSIS — R252 Cramp and spasm: Secondary | ICD-10-CM | POA: Insufficient documentation

## 2022-06-02 DIAGNOSIS — I10 Essential (primary) hypertension: Secondary | ICD-10-CM | POA: Insufficient documentation

## 2022-06-02 DIAGNOSIS — R262 Difficulty in walking, not elsewhere classified: Secondary | ICD-10-CM

## 2022-06-02 MED ORDER — VALSARTAN 40 MG PO TABS: 40.0000 mg | ORAL_TABLET | Freq: Every day | ORAL | 3 refills | Status: DC

## 2022-06-02 NOTE — Therapy (Signed)
OUTPATIENT PHYSICAL THERAPY TREATMENT NOTE   Patient Name: Lindsey [name] MRN: [id number] DOB:Nov 21, 1955, 66 y.o., female Today's Date: 06/02/2022  PCP: Dr. Tomasa Hose MD REFERRING PROVIDER: Leeroy Cha MD   PT End of Session - 06/02/22 0938     Visit Number 45    Number of Visits 65    Date for PT Re-Evaluation 07/28/22    Authorization Type Medicare    Authorization Time Period Cert through 06/05/80; Recert 05/04/9936-10/08/9676; Recert 06/05/8100-75/10    Progress Note Due on Visit 50    PT Start Time 0933    PT Stop Time 1015    PT Time Calculation (min) 42 min    Equipment Utilized During Treatment Gait belt    Activity Tolerance No increased pain;Patient tolerated treatment well    Behavior During Therapy [facility] for tasks assessed/performed                    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 No pertinent surgical history. 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 ([place]) 09/24/2021   Cerebral embolism with cerebral infarction 09/23/2021   Pyelonephritis 09/19/2021   Pain due to onychomycosis of toenails of both feet 11/19/2020   Normocytic anemia 05/31/2020   Acute ischemic left MCA stroke ([place]) 04/30/2020   Right hemiplegia ([place]) 04/30/2020   Cerebrovascular accident  ([place]) 04/21/2020   Atrial fibrillation with RVR ([place]) 04/21/2020   Essential hypertension 04/21/2020   Alcohol abuse 04/21/2020    REFERRING DIAG: s/p CVA  THERAPY DIAG:  Muscle weakness (generalized)  Unsteadiness on feet  Difficulty in walking, not elsewhere classified  Abnormality of gait and mobility  Rationale for Evaluation and Treatment Rehabilitation  PERTINENT HISTORY: Pt was doing very well and was walking short distances wqith a cane prior to second stroke in december. At this point she is limited to wheelchair as her main means of mobility. Pt caregiver reports all detailed information. Pt has hesitation with the left lower extremity and significant weakness on the right lower extremity. Pt will take 2-3 steps when transfering. Pt caregiver assists with all transfers.Pt has shower with ledge to steps over ledge when entering and utilizes bedside commode. Pt has 2 level home with chair lift for upstairs access. Pt would like to improve ransfers, improve neglect on her R side and improve her overall strength and mobility.Pt. is a 66 y.o. 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 Pt. has Right sided weakness, and receptive, and expressive aphasia. Pt. has had a previous CVA with right sided weakness in July 2021. PMHx includes: AFib, Renal Artery thrombosis, situational depression, HTN, Pyelnephritis, Seizure activity, COnstipation, AKI. Vitamin D deficiency, and urinary incontinence.  PRECAUTIONS: fall risk, wearing RLE AFO  SUBJECTIVE: Patient reports having a good and restful weekend  PAIN:  Are you having pain? No      TODAY'S TREATMENT:  06/02/22   INTERVENTIONS:    Therapeutic Exercise:   Amb  in // bars - down and back x 3 lengths of // bars with left UE on railing, CGA and VC to stand erect and look forward. Patient performed well with walking yet struggled with turning and  advancing right LE- continued VC for instruction for safe STS technique - completed last length of // bars with retro ambulation, pt able to progress R LE to neutral and makes most of progress with retro step to gait pattern.   STS for safe technique (UE and LE Positioning) x 10 with airex pad under seat an dL . Continued with max VC for hand placement and for using weight shift for momentum rather than to grab and pull on closest object.   Standing lateral step tap with mod A from PT to place foot on step. X 10 R LE stepping   1 lap lateral stepping x 20 to right and 10 to left ( shorter side step length to right compared to left) .    Seated: Seated knee ext- 1 set x 12 reps RLE  Seated x10 reps with mod to max assist with right lower extremity hip flexion.  Physical therapist also had to block patient right lower extremity from attempting long arc quad activity.  With this block patient did begin to have some hip flexor muscle activation independent of quad activation.   PATIENT EDUCATION: Education details: Pt educated throughout session about proper posture and technique with exercises. Improved exercise technique, movement at target joints, use of target muscles after min to mod verbal, visual, tactile cues. Continued education on technique with STS.  Person educated: Patient Education method: Explanation, Demonstration, and Verbal cues Education comprehension: verbalized understanding, returned demonstration, and needs further education   HOME EXERCISE PROGRAM: Continue as previously given;    PT Short Term Goals -       PT SHORT TERM GOAL #1   Title Patient will be independent in home exercise program to improve strength/mobility for better functional independence with ADLs.    Baseline no formal HEp for LE/balance at this time; 02/08/2022=Patient continues to require VC for reminders and assist with hip march/knee ext (seated for strengthening); Reminders to continue to work on  static stand at home.    Time 4    Period Weeks    Status Partially Met  04/07/22: complete somewhat regularly     Target Date 12/14/21              PT Long Term Goals -       PT LONG TERM GOAL #1   Title Patient will increase FOTO score to equal to or greater than   41  to demonstrate statistically significant improvement in mobility and quality of life.    Baseline 16 on 2/14; 02/08/2022=Patient unable to complete today- No family present but will attempt again next visit available with family present to assist in completion; 02/17/2022= 40    Time 12    Period Weeks    Status On-going    Target Date 07/28/2022       PT LONG TERM GOAL #3   Title Patient  will complete five times sit to stand test in < 30 seconds with UE assistance indicating an increased LE strength and  improved balance.    Baseline 58.47 s with UE assist and with A from PT for R LE positioning; 02/08/2022=54 sec with use of L UE support and Min physical assist to position right LE.  7/6: 30.5 sec, min A for LE positioning, some attempts did not come to full erect posture, did not let go with L UE from armchair with any attempt 05/05/22: 26.65 sec    Time 12    Period Weeks    Status Met    Target Date 05/03/22      PT LONG TERM GOAL #4   Title Patient will increase Berg Balance score by > 6 points to demonstrate decreased fall risk during functional activities.    Baseline 12 on 2/14; 02/08/2022= 13/56  04/07/22: 17  8/3: 19    Time 12    Period Weeks    Status Met    Target Date 05/03/22      PT LONG TERM GOAL #5   Title Pt will transfer from seated position on table/chair to/from transport chair/WC in order to indicate improved transfer ability.    Baseline Requires Min A for LE positioning and to prevent LOB. 02/08/2022=Patient attempted to perform on her own- Unable to push herself up from chair- Very min assist to position right LE into more flexed position and VC for hand placement. Patient able to stand  with min Assist with use of gait belt and VC to lean; 02/17/2022=Patient was able to transfer from transport w/c with some physical assist with Right to swing to pivot to side of chair. She was able to sit to stand today with only CGA and then stand pivot with right arm holding onto arm of chair. Only difficulty was stand to sit- Max VC not to plop into chair. 04/07/22: Pt performed movement of B LE to side of chair independently with chair transfer. Pt required min A with R LE foot placement of floor. Pt requires increased time to complete but complete with CGA only for safety and no cues or A to prevent falling/ plopping into armchair.  8/3: Pt requires min A for maintenance of standig balance, able to navigate from transport chairr to arm chair with hemiwalker, does not controll descent with ahands still " flops" into chair    Time 12    Period Weeks    Status Partially met    Target Date 07/28/2022       PT LONG TERM GOAL #6   Title Patient will ambulate > 150 feet with CGA using Hemiwalker on level surfaces for improved household and short community distances.    Baseline 02/08/2022= 40 feet with use of HW, heavy CGA with max VC for gait sequencing and w/c follow. 5/18 = 85 feet with use of HW, close CGA  7/6: 45 feet, heavy use of hemiwalker 8/3: 85 feet with heavy hemi walker use, no Lob throughout, cues for appropriate weight distribution    Time 12    Period Weeks    Status On-going    Target Date 07/28/2022        PT LONG TERM GOAL #7   Title Pt will increase 10MWT by at least 0.13 m/s in order to demonstrate clinically significant improvement in community ambulation.    Baseline 02/08/2022= 0.05 m/s using HW 7/6: .075ms with heavy ue use of hemiwalker and CGA for balance 8/3: .0573m   Time 12    Period Weeks    Status Ongoing     Target Date  07/28/2022      8.  Pt will improve 5X STS with full knee extension in standing each rep and with appropriate eccentric control in order to  indicate improved transfer efficacy and improved LE strength  Baseline: 52.45 sec with UE support Goal status: INITIAL  9.  Patient will increase Berg Balance score by > 6 points to demonstrate decreased fall risk during functional activities Baseline: 19 Goal status: INITIAL           Plan -     Clinical Impression Statement Patient presents with good motivation for completion of physical therapy activities.  Patient continues to have significant difficulty with independent right lower extremity muscle activation particularly in her hip flexors.  Patient did have improve muscle activation in her quads and hamstring musculature on the involved side this session with ability to   perform long arc quad as well as returning right lower extremity to flexed position.  Patient continues to progress with lower extremity strengthening and ambulation and will continue to benefit from skilled physical therapy to improve her lower extremity strength, balance, improve her transfer ability, and improve her quality of life.    Personal Factors and Comorbidities Age;Comorbidity 1;Comorbidity 2;Comorbidity 3+;Time since onset of injury/illness/exacerbation;Other    Comorbidities AFib, Renal Artery thrombosis, situational depression, HTN, Pyelnephritis, Seizure activity, COnstipation, AKI    Examination-Activity Limitations Bathing;Bed Mobility;Carry;Locomotion Level;Dressing;Sit;Squat;Stairs;Stand;Toileting;Transfers    Examination-Participation Restrictions Church;Community Activity;Laundry    Stability/Clinical Decision Making Evolving/Moderate complexity    Rehab Potential Fair    PT Frequency 2x / week    PT Duration 12 weeks    PT Treatment/Interventions ADLs/Self Care Home Management;Gait training;Stair training;Therapeutic activities;Therapeutic exercise;Balance training;Neuromuscular re-education;Patient/family education;Wheelchair mobility training;Manual techniques;Energy conservation;Dry  needling;Passive range of motion;Taping;Spinal Manipulations;Joint Manipulations    PT Next Visit Plan Gait training, Transfer training, LE strengthening as appropriate.    PT Home Exercise Plan HEP: general LE and postural exercises    Consulted and Agree with Plan of Care Patient            Particia Lather PT   06/02/22, 1:55 PM

## 2022-06-03 NOTE — Progress Notes (Signed)
Subjective:    Patient ID: Lindsey [name], female    DOB: 1955/12/26, 66 y.o.   MRN: [id number]  HPI: Lindsey [name] is a 66 y.o. female who is here for f/u of her ischemic left MCA stroke, Right Hemiplegia and Essential Hypertension. Lindsey [name] was brought to [facility] ED on 07/202/2021 with right sided weakness and slurred speech.   1) HTN: -Her BP today is very elevated at 162/110.  -She continues to take the amlodipine and lopressor.  -All medications reviewed and she does not require refills.  2) Right sided hemiplegia secondary to left MCA stroke: -she had excellent improvement with Botox without side effects!  -she would like to have this repeated as soon as possible  3)Aphasia  -improving -did not benefit from aphasia support group, but is interested in our [facility] support group  Eating well.   Sleeping well.   Using Mirtazepine as needed.   Prior history: She has been working on balance in therapy. She had her SLP evaluation and next week has her OT eval next week. She has been ambulating. She needs assistance to walk and her daughter does not feel comfortable walking with her on her own- right now she ambulates mostly with physical therapy.   She his using the bedside commode. Her daughter has been helping her. She works evening.   Lindsey [name] says things have been going well at home.   Moving bowels regularly.   Her BP is low in office to 100/69. Her daughter has ordered a BP machine.   She does not need any refills this visit.   Denies pain at this time. Has been taking the Gabapentin BID  Denies depression but does have sadness  Has been eating well.    CT Head WO Contrast:  IMPRESSION: 1. There is a large hypodensity of the anterior left MCA territory, findings consistent with acute to subacute left MCA territory infarction. ASPECTS 4.   2.  No evidence of hemorrhage.   3. There is mild mass effect on the left lateral ventricle  with approximately 3 mm left right midline shift.  CT ANGIO Head/Neck:  IMPRESSION: 1. 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 Acute occlusion inferior division left M2  . 3. No other intracranial stenosis 4. No extracranial stenosis in the carotid or vertebral arteries. 5. These results were called by telephone at the time of interpretation on 04/21/2020 at 6:59 pm to provider JOSHUA LONG , who verbally acknowledged these results.  MRI Brain:  IMPRESSION: Acute infarct left MCA territory there is mild hemorrhage in the infarct which is not seen on recent CT earlier today.   Lindsey [name] was admitted to Inpatient Rehabilitation on 04/30/2020 and discharged home on 05/29/2020. She is receiving Home Health Therapy with [facility].  Lindsey [name] was ale to answer yes and no questions, her speech noted to be Language of confusion at times. This provider spoke with Lindsey [name] daughter Lindsey [name], after assessment, she answered all provider questions and provider answered her questions. LindseyOrozco was unable to be in room with her mother Lindsey [name] due to being febrile, she was encouraged to F/U with her doctor as well, she verbalizes understanding.   Pain Inventory Average Pain 5 Pain Right Now 0 My pain is intermittent, sharp, burning, tingling and aching  LOCATION OF PAIN    BOWEL Number of stools per week: 35  Oral laxative use No  Type of laxative  Enema or suppository use No  History of colostomy No  Incontinent No   BLADDER Pads In and out cath, frequency  Able to self cath No  Bladder incontinence Yes  Frequent urination No  Leakage with coughing No  Difficulty starting stream No  Incomplete bladder emptying No    Mobility ability to climb steps?  no do you drive?  no use a wheelchair needs help with transfers - Function disabled: date disabled 04/2020 I need assistance with the following:  feeding, dressing, bathing, toileting, meal prep, household duties and  shopping  Neuro/Psych trouble walking confusion depression  Prior Studies Any changes since last visit?  no  Physicians involved in your care Any changes since last visit?  no   Family History  Problem Relation Age of Onset   Breast cancer Neg Hx    Social History   Socioeconomic History   Marital status: Single    Spouse name: Not on file   Number of children: Not on file   Years of education: Not on file   Highest education level: Not on file  Occupational History   Not on file  Tobacco Use   Smoking status: Never    Passive exposure: Never   Smokeless tobacco: Never  Vaping Use   Vaping Use: Never used  Substance and Sexual Activity   Alcohol use: Not Currently    Comment: states once a wine/beer occassionally    Drug use: No   Sexual activity: Not Currently  Other Topics Concern   Not on file  Social History Narrative   Not on file   Social Determinants of Health   Financial Resource Strain: Not on file  Food Insecurity: Not on file  Transportation Needs: Not on file  Physical Activity: Not on file  Stress: Not on file  Social Connections: Not on file   No past surgical history on file. 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 (HCC)    CVA (cerebral vascular accident) ([place])    Diverticulosis    History of ischemic left MCA stroke    Hypertension    Pain due to onychomycosis of toenails of both feet    Renal artery thrombosis (HCC)    Uterine prolapse    BP (!) 162/110   Pulse 77   SpO2 98%   Opioid Risk Score:   Fall Risk Score:  `1  Depression screen [facility] 2/9     11/15/2021   10:42 AM 10/27/2021    2:29 PM 06/29/2021   10:16 AM 08/25/2020   11:15 AM 06/15/2020   11:20 AM  Depression screen PHQ 2/9  Decreased Interest 1 1 0 0 1  Down, Depressed, Hopeless 1 1 0 0 1  PHQ - 2 Score 2 2 0 0 2  Altered sleeping     0  Tired, decreased energy      1  Change in appetite     0  Feeling bad or failure about yourself      1  Trouble concentrating     0  Moving slowly or fidgety/restless     3  Suicidal thoughts     0  PHQ-9 Score     7  Difficult doing work/chores     Somewhat difficult   Review of Systems  Constitutional: Negative.   HENT: Negative.    Eyes: Negative.   Respiratory: Negative.    Cardiovascular: Negative.   Gastrointestinal: Negative.  Endocrine: Negative.   Genitourinary: Negative.   Musculoskeletal:  Positive for gait problem.       Spasms  Skin: Negative.   Allergic/Immunologic: Negative.   Neurological:  Positive for weakness.  Psychiatric/Behavioral:  Positive for confusion and dysphoric mood.   All other systems reviewed and are negative.      Objective:   Gen: no distress, normal appearing, BP 162/110 HEENT: oral mucosa pink and moist, NCAT Cardio: Reg rate Chest: normal effort, normal rate of breathing Abd: soft, non-distended Ext: no edema Psych: pleasant, normal affect Skin: intact Neuro: Arrived in wheelchair  Musculoskeletal: Normal Muscle Bulk and Muscle Testing Reveals:  Upper Extremities: Right Upper Extremity: 0/5 strength.  Left Lower Extremity: Full ROM and Muscle Strength 5/5  Lower Extremities: Right Lower Extremity: 4-/5 HF, KE, 0/5 DF and PF.  Left Lower Extremity: Full ROM and Muscle Strength 5/5 Right sided MAS 2 in elbow flexors, wrist flexors, and finger flexors Severe expressive >receptive aphasia Psych: Pt's affect is appropriate. Pt is cooperative    Assessment & Plan:  1. Acute Ischemic Left MCA Stroke:Right Hemiplegia: Continue outpatient OT, PT, and SLP. She has had a HFU appointment with Neurology. Discussed that I can provide new scripts next month if needed. Communicated with her providers regarding positive reinforcement. She has made excellent gains in strength as well as cognition and speech!  -Provided with new disability placard.   2. Essential  Hypertension: BP is currently very elevated and discussed with patient that it would be unsafe for her to receive injections today as the injections can temporarily increase her pain, which can increase her blood pressure, and put her at risk for stroke. Discussed starting the medication Valsartan 40mg  daily and logging blood pressures daily at home and she is agreeable. Medications reviewed and she is taking Amlodipine 5mg  and lopressor.  Provided with a list of foods that will help lower her BP.  -BP is 162/110today.  -Advised checking BP daily at home and logging results to bring into follow-up appointment with PCP and myself. -Reviewed BP meds today.  -Advised regarding healthy foods that can help lower blood pressure and provided with a list: 1) citrus foods- high in vitamins and minerals 2) salmon and other fatty fish - reduces inflammation and oxylipins 3) swiss chard (leafy green)- high level of nitrates 4) pumpkin seeds- one of the best natural sources of magnesium 5) Beans and lentils- high in fiber, magnesium, and potassium 6) Berries- high in flavonoids 7) Amaranth (whole grain, can be cooked similarly to rice and oats)- high in magnesium and fiber 8) Pistachios- even more effective at reducing BP than other nuts 9) Carrots- high in phenolic compounds that relax blood vessels and reduce inflammation 10) Celery- contain phthalides that relax tissues of arterial walls 11) Tomatoes- can also improve cholesterol and reduce risk of heart disease 12) Broccoli- good source of magnesium, calcium, and potassium 13) Greek yogurt: high in potassium and calcium 14) Herbs and spices: Celery seed, cilantro, saffron, lemongrass, black cumin, ginseng, cinnamon, cardamom, sweet basil, and ginger 15) Chia and flax seeds- also help to lower cholesterol and blood sugar 16) Beets- high levels of nitrates that relax blood vessels  17) spinach and bananas- high in potassium  -Provided lise of supplements  that can help with hypertension:  1) magnesium: one high quality brand is Bioptemizers since it contains all 7 types of magnesium, otherwise over the counter magnesium gluconate 400mg  is a good option 2) B vitamins 3) vitamin D 4)  potassium 5) CoQ10 6) L-arginine 7) Vitamin C 8) Beetroot -Educated that goal BP is 120/80. -Made goal to incorporate some of the above foods into diet.    3. Prevention of pressure injury:  -recommended offloading every hour.  -Lying prone and supine in bed at times to offload her sacrum.   4. Appetite:  -improved.  -Encouraged eating nutritious foods and discussed recommendations.  -May stop Mirtazepine.  -Provided with turmeric to add to meals. -Turmeric to reduce inflammation--can be used in cooking or taken as a supplement.  Benefits of turmeric:  -Highly anti-inflammatory  -Increases antioxidants  -Improves memory, attention, brain disease  -Lowers risk of heart disease  -May help prevent cancer  -Decreases pain  -Alleviates depression  -Delays aging and decreases risk of chronic disease  -Consume with black pepper to increase absorption   Turmeric Milk Recipe:  1 cup milk  1 tsp turmeric  1 tsp cinnamon  1 tsp grated ginger (optional)  Black pepper (boosts the anti-inflammatory properties of turmeric).  1 tsp honey  5) Depression: -Improved, can stop Mirtazepine -Counseled regarding making time for the activities she loves every day.  6) Pain well controlled: may use Gabapentin as needed.   7) Post-stroke spasticity: will reschedule Botox to next available appointment, continue baclofen  7) HLD: advised wild caught salmon once per week and walnuts every day. On 40mg  statin daily.   All questions were encouraged and answered.  F/U in February.

## 2022-06-07 ENCOUNTER — Ambulatory Visit: Payer: Medicare Other

## 2022-06-07 ENCOUNTER — Encounter: Payer: Medicare Other | Admitting: Speech Pathology

## 2022-06-07 ENCOUNTER — Encounter: Payer: Self-pay | Admitting: Occupational Therapy

## 2022-06-07 ENCOUNTER — Ambulatory Visit: Payer: Medicare Other | Attending: Physical Medicine and Rehabilitation | Admitting: Occupational Therapy

## 2022-06-07 DIAGNOSIS — M6281 Muscle weakness (generalized): Secondary | ICD-10-CM | POA: Insufficient documentation

## 2022-06-07 DIAGNOSIS — R2681 Unsteadiness on feet: Secondary | ICD-10-CM | POA: Insufficient documentation

## 2022-06-07 DIAGNOSIS — R269 Unspecified abnormalities of gait and mobility: Secondary | ICD-10-CM | POA: Insufficient documentation

## 2022-06-07 DIAGNOSIS — I63512 Cerebral infarction due to unspecified occlusion or stenosis of left middle cerebral artery: Secondary | ICD-10-CM | POA: Insufficient documentation

## 2022-06-07 DIAGNOSIS — I63542 Cerebral infarction due to unspecified occlusion or stenosis of left cerebellar artery: Secondary | ICD-10-CM | POA: Insufficient documentation

## 2022-06-07 DIAGNOSIS — R482 Apraxia: Secondary | ICD-10-CM | POA: Insufficient documentation

## 2022-06-07 DIAGNOSIS — R262 Difficulty in walking, not elsewhere classified: Secondary | ICD-10-CM | POA: Insufficient documentation

## 2022-06-07 DIAGNOSIS — R278 Other lack of coordination: Secondary | ICD-10-CM | POA: Insufficient documentation

## 2022-06-07 DIAGNOSIS — R2689 Other abnormalities of gait and mobility: Secondary | ICD-10-CM | POA: Insufficient documentation

## 2022-06-07 NOTE — Therapy (Signed)
[facility] MAIN [facility] SERVICES [address] [place], [place], [address] Phone: [phone]   Fax:  [phone]  Patient Details  Name: Lindsey [name] MRN: [id number] Date of Birth: [birth year] Referring Provider:  No ref. provider found  Encounter Date: 06/07/2022  Pt. did not arrived to her therapy sessions today. A follow-up call was placed, and a message was left via voicemail with the contact number to our clinic.   Olegario Messier, MS, OTR/L   Olegario Messier, OT 06/07/2022, 2:22 PM  [facility] MAIN [facility] SERVICES [address] [place], [place], [address] Phone: [phone]   Fax:  [phone]

## 2022-06-09 ENCOUNTER — Encounter: Payer: Medicare Other | Admitting: Speech Pathology

## 2022-06-09 ENCOUNTER — Ambulatory Visit: Payer: Medicare Other | Admitting: Occupational Therapy

## 2022-06-09 ENCOUNTER — Ambulatory Visit: Payer: Medicare Other

## 2022-06-09 DIAGNOSIS — R2681 Unsteadiness on feet: Secondary | ICD-10-CM | POA: Diagnosis present

## 2022-06-09 DIAGNOSIS — M6281 Muscle weakness (generalized): Secondary | ICD-10-CM | POA: Diagnosis present

## 2022-06-09 DIAGNOSIS — R269 Unspecified abnormalities of gait and mobility: Secondary | ICD-10-CM | POA: Diagnosis present

## 2022-06-09 DIAGNOSIS — I63512 Cerebral infarction due to unspecified occlusion or stenosis of left middle cerebral artery: Secondary | ICD-10-CM | POA: Diagnosis present

## 2022-06-09 DIAGNOSIS — R262 Difficulty in walking, not elsewhere classified: Secondary | ICD-10-CM | POA: Diagnosis present

## 2022-06-09 DIAGNOSIS — R278 Other lack of coordination: Secondary | ICD-10-CM | POA: Diagnosis present

## 2022-06-09 DIAGNOSIS — R482 Apraxia: Secondary | ICD-10-CM | POA: Diagnosis present

## 2022-06-09 DIAGNOSIS — I63542 Cerebral infarction due to unspecified occlusion or stenosis of left cerebellar artery: Secondary | ICD-10-CM

## 2022-06-09 DIAGNOSIS — R2689 Other abnormalities of gait and mobility: Secondary | ICD-10-CM | POA: Diagnosis present

## 2022-06-09 NOTE — Therapy (Signed)
OUTPATIENT PHYSICAL THERAPY TREATMENT NOTE   Patient Name: Lindsey [name] MRN: [id number] DOB:[birth year], 66 y.o., female Today's Date: 06/10/2022  PCP: Dr. Tomasa Hose MD REFERRING PROVIDER: Leeroy Cha MD   PT End of Session - 06/09/22 1109     Visit Number 46    Number of Visits 20    Date for PT Re-Evaluation 07/28/22    Authorization Type Medicare    Authorization Time Period Cert through 01/07/81; Recert 06/07/6212-0/05/6577; Recert 01/07/9628-52/84    Progress Note Due on Visit 50    PT Start Time 1100    PT Stop Time 1142    PT Time Calculation (min) 42 min    Equipment Utilized During Treatment Gait belt    Activity Tolerance No increased pain;Patient tolerated treatment well    Behavior During Therapy [facility] for tasks assessed/performed                    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 No pertinent surgical history. 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 ([place]) 09/24/2021   Cerebral embolism with cerebral infarction 09/23/2021   Pyelonephritis 09/19/2021   Pain due to onychomycosis of toenails of both feet 11/19/2020   Normocytic anemia 05/31/2020   Acute ischemic left MCA stroke ([place]) 04/30/2020   Right hemiplegia ([place]) 04/30/2020   Cerebrovascular accident  ([place]) 04/21/2020   Atrial fibrillation with RVR ([place]) 04/21/2020   Essential hypertension 04/21/2020   Alcohol abuse 04/21/2020    REFERRING DIAG: s/p CVA  THERAPY DIAG:  Muscle weakness (generalized)  Unsteadiness on feet  Difficulty in walking, not elsewhere classified  Abnormality of gait and mobility  Other lack of coordination  Other abnormalities of gait and mobility  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 At this point she is limited to wheelchair as her main means of mobility. Pt caregiver reports all detailed information. Pt has hesitation with the left lower extremity and significant weakness on the right lower extremity. Pt will take 2-3 steps when transfering. Pt caregiver assists with all transfers.Pt has shower with ledge to steps over ledge when entering and utilizes bedside commode. Pt has 2 level home with chair lift for upstairs access. Pt would like to improve ransfers, improve neglect on her R side and improve her overall strength and mobility.Pt. is a 66 y.o. 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 Pt. has Right sided weakness, and receptive, and expressive aphasia. Pt. has had a previous CVA with right sided  weakness in July 2021. PMHx includes: AFib, Renal Artery thrombosis, situational depression, HTN, Pyelnephritis, Seizure activity, COnstipation, AKI. Vitamin D deficiency, and urinary incontinence.   PRECAUTIONS: fall risk, wearing RLE AFO  SUBJECTIVE: Patient reports no new changes since last visit.    PAIN:  Are you having pain? No      TODAY'S TREATMENT:  06/09/2022   INTERVENTIONS:    Therapeutic Exercise:  Seated ham curl with use of  sliding disc 2 sets of 15 with AAROM for max ROM- Patient able to initiate but only flex approx 30 deg.  Seated hip march (limited ability on right with weak hip flexors and continued to perform more of a LAQ- PT blocked her right foot with PT Foot to prevent excessive knee ext)   Seated hip abd- AAROM on right and AROM on Left 2 sets of 15 reps Seated LAQ- AROM 2 sets of 15 reps  Sit to stand: with mod VC for hand placement and occasional positioning of right foot.  X 10 reps. VC for forward lean as well.  Manual therapy:   Right Hamstring - 30 sec hold x 3 Massage stick roller to posteior thigh/knee x 3 min       GAIT TRAINING:   Patient ambulated 70 feet with hemiwalker and CGA exhibiting fair ability to weight shift toward affected side and max VC for sequencing. Narrowed BOS with constant cues to widen stance.     PATIENT EDUCATION: Education details: Pt educated throughout session about proper posture and technique with exercises. Improved exercise technique, movement at target joints, use of target muscles after min to mod verbal, visual, tactile cues. Continued education on technique with STS.  Person educated: Patient Education method: Explanation, Demonstration, and Verbal cues Education comprehension: verbalized understanding, returned demonstration, and needs further education   HOME EXERCISE PROGRAM: Continue as previously given;    PT Short Term Goals -       PT SHORT TERM GOAL #1   Title Patient will be independent in home exercise program to improve strength/mobility for better functional independence with ADLs.    Baseline no formal HEp for LE/balance at this time; 02/08/2022=Patient continues to require VC for reminders and assist with hip march/knee ext (seated for strengthening); Reminders to continue to work on static stand at home.    Time 4    Period Weeks    Status Partially Met  04/07/22: complete somewhat regularly     Target Date 12/14/21               PT Long Term Goals -       PT LONG TERM GOAL #1   Title Patient will increase FOTO score to equal to or greater than   41  to demonstrate statistically significant improvement in mobility and quality of life.    Baseline 16 on 2/14; 02/08/2022=Patient unable to complete today- No family present but will attempt again next visit available with family present to assist in completion; 02/17/2022= 40    Time 12    Period Weeks    Status On-going    Target Date 07/28/2022       PT LONG TERM GOAL #3   Title Patient  will complete five times sit to stand test in < 30 seconds with UE assistance indicating an increased LE strength and improved balance.    Baseline 58.47 s with UE assist and with A from PT for R LE positioning; 02/08/2022=54 sec with use of L UE support and  Min physical assist to position right LE.  7/6: 30.5 sec, min A for LE positioning, some attempts did not come to full erect posture, did not let go with L UE from armchair with any attempt 05/05/22: 26.65 sec    Time 12    Period Weeks    Status Met    Target Date 05/03/22      PT LONG TERM GOAL #4   Title Patient will increase Berg Balance score by > 6 points to demonstrate decreased fall risk during functional activities.    Baseline 12 on 2/14; 02/08/2022= 13/56  04/07/22: 17  8/3: 19    Time 12    Period Weeks    Status Met    Target Date 05/03/22      PT LONG TERM GOAL #5   Title Pt will transfer from seated position on table/chair to/from transport chair/WC in order to indicate improved transfer ability.    Baseline Requires Min A for LE positioning and to prevent LOB. 02/08/2022=Patient attempted to perform on her own- Unable to push herself up from chair- Very min assist to position right LE into more flexed position and VC for hand placement. Patient able to stand with min Assist with use of gait belt and VC to lean; 02/17/2022=Patient was able to transfer from transport w/c with some physical assist with Right to  swing to pivot to side of chair. She was able to sit to stand today with only CGA and then stand pivot with right arm holding onto arm of chair. Only difficulty was stand to sit- Max VC not to plop into chair. 04/07/22: Pt performed movement of B LE to side of chair independently with chair transfer. Pt required min A with R LE foot placement of floor. Pt requires increased time to complete but complete with CGA only for safety and no cues or A to prevent falling/ plopping into armchair.  8/3: Pt requires min A for maintenance of standig balance, able to navigate from transport chairr to arm chair with hemiwalker, does not controll descent with ahands still " flops" into chair    Time 12    Period Weeks    Status Partially met    Target Date 07/28/2022       PT LONG TERM GOAL #6   Title Patient will ambulate > 150 feet with CGA using Hemiwalker on level surfaces for improved household and short community distances.    Baseline 02/08/2022= 40 feet with use of HW, heavy CGA with max VC for gait sequencing and w/c follow. 5/18 = 85 feet with use of HW, close CGA  7/6: 45 feet, heavy use of hemiwalker 8/3: 85 feet with heavy hemi walker use, no Lob throughout, cues for appropriate weight distribution    Time 12    Period Weeks    Status On-going    Target Date 07/28/2022        PT LONG TERM GOAL #7   Title Pt will increase 10MWT by at least 0.13 m/s in order to demonstrate clinically significant improvement in community ambulation.    Baseline 02/08/2022= 0.05 m/s using HW 7/6: .013ms with heavy ue use of hemiwalker and CGA for balance 8/3: .0564m   Time 12    Period Weeks    Status Ongoing     Target Date 07/28/2022      8.  Pt will improve 5X STS with full knee extension in standing each rep and with appropriate eccentric control in order to  indicate improved transfer efficacy and improved LE strength  Baseline: 52.45 sec with UE support Goal status: INITIAL  9.  Patient will increase  Berg Balance score by > 6 points to demonstrate decreased fall risk during functional activities Baseline: 19 Goal status: INITIAL           Plan -     Clinical Impression Statement Patient presents with good motivation for completion of physical therapy activities.  Patient presents with overall increased tightness in right LE and poor overall muscle activation with difficulty positioning leg in transfers and walking. Patient will continue to benefit from skilled physical therapy to improve her lower extremity strength, balance, improve her transfer ability, and improve her quality of life.    Personal Factors and Comorbidities Age;Comorbidity 1;Comorbidity 2;Comorbidity 3+;Time since onset of injury/illness/exacerbation;Other    Comorbidities AFib, Renal Artery thrombosis, situational depression, HTN, Pyelnephritis, Seizure activity, COnstipation, AKI    Examination-Activity Limitations Bathing;Bed Mobility;Carry;Locomotion Level;Dressing;Sit;Squat;Stairs;Stand;Toileting;Transfers    Examination-Participation Restrictions Church;Community Activity;Laundry    Stability/Clinical Decision Making Evolving/Moderate complexity    Rehab Potential Fair    PT Frequency 2x / week    PT Duration 12 weeks    PT Treatment/Interventions ADLs/Self Care Home Management;Gait training;Stair training;Therapeutic activities;Therapeutic exercise;Balance training;Neuromuscular re-education;Patient/family education;Wheelchair mobility training;Manual techniques;Energy conservation;Dry needling;Passive range of motion;Taping;Spinal Manipulations;Joint Manipulations    PT Next Visit Plan Gait training, Transfer training, LE strengthening as appropriate.    PT Home Exercise Plan HEP: general LE and postural exercises    Consulted and Agree with Plan of Care Patient            Lewis Moccasin PT   06/10/22, 3:05 PM

## 2022-06-09 NOTE — Therapy (Addendum)
OUTPATIENT OCCUPATIONAL THERAPY TREATMENT NOTE   Patient Name: Lindsey [name] MRN: [id number] DOB:August 12, 1956, 66 y.o., female Today's Date: 06/09/2022   REFERRING PROVIDER: Karie Fetch, MD   OT End of Session - 06/09/22 1434     Visit Number 55    Number of Visits 72    Date for OT Re-Evaluation 06/27/22    Authorization Type Progress report period starting 02/17/22    OT Start Time 1150    OT Stop Time 1230    OT Time Calculation (min) 40 min    Activity Tolerance Patient tolerated treatment well    Behavior During Therapy WFL for tasks assessed/performed               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) 09/24/2021   Cerebral embolism with cerebral infarction 09/23/2021   Pyelonephritis 09/19/2021   Pain due to onychomycosis of toenails of both feet 11/19/2020   Normocytic anemia 05/31/2020   Acute ischemic left MCA stroke (HCC) 04/30/2020   Right hemiplegia (HCC) 04/30/2020   Cerebrovascular accident (HCC) 04/21/2020   Atrial fibrillation with RVR (HCC) 04/21/2020   Essential hypertension 04/21/2020   Alcohol abuse 04/21/2020    RUE Measurements (Taken 04/04/22):   PROM Shoulder flexion:  7/03: 0(108)  5/18:  0(105)  PROM Shoulder abduction: 7/03: 6(270)  5/18: 0(102)  PROM Elbow:   7/03: 0(0-140)  5/18: 0(0-136)  AROM Wrist extension:  7/03: -28(72)  5/18: -40(72)  REFERRING DIAG: CVA  THERAPY DIAG:  No diagnosis found.  Rationale for Evaluation and Treatment Rehabilitation  PERTINENT HISTORY: Pt. is a 66 y.o. 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 Pt. has Right sided weakness, and receptive, and expressive aphasia. Pt. has had a previous CVA with right sided weakness in July 2021. PMHx includes: AFib, Renal Artery thrombosis, situational depression, HTN, Pyelnephritis, Seizure activity, COnstipation, AKI. Vitamin D deficiency, and urinary incontinence/  PRECAUTIONS: Fall   SUBJECTIVE: Pt.  Reports no pain today.    PAIN:  Are you having pain? No     OBJECTIVE:   TODAY'S TREATMENT:   Rationale for Evaluation and Treatment Rehabilitation  OT TREATMENT    Self-Care:  Pt. Worked on one armed UE dressing donning a button down shirt. Emphasis was placed on threading her RUE through the shirt pulling it up to her right shoulder in preparation for bringin gthne collar around the back of the neck before threading the LUE sleeve through.  Manual Therapy:   Pt. tolerated soft tissue massage to the right scapular musculature. Pt. tolerated scapular mobilizations in elevation, depression, abduction, and rotation following moist heat modality to  the right shoulder, wrist, and hand. Pt. tolerated soft tissue mobilizations for radius on ulna, and metacarpal spread stretches to decrease tightness, and prepare the UE, and hand for ROM and therapeutic Ex. Manual therapy was performed independent of, and in preparation for there. Ex. Pt. education was provided about soft tissue massage to the right forearm.   Therapeutic Exercise:   Pt. tolerated PROM through all joint ranges of the RUE, and hand following  moist heat modality, and manual therapy. Pt. worked on reps of active right wrist extension, and right supination with support proximally. Pt. education was provided about opportunities to engage the RUE during daily tasks.   Pt.'s BP 128/79 HR 56 bpms. Pt. is improving with using one armed dressing techniques for donning a button down shirt. Pt. was able to consistently thread her right hand, and forearm through the sleeve. Pt. required assist to thread the right sleeve around her elbow, and pull the sleeve up from the elbow to the shoulder. Pt. has improved with efficiently reaching around the back of her neck for the shirt collar, and donn her left shoulder through. Pt. continues to Improve with each trial. Pt. continues to require increased verbal, and tactile cues to perform self-ROM to the right shoulder. Pt. required fewer cues for ROM to the right wrist. Pt. required tactile, and verbal cues for soft tissue massage to the right forearm. Pt. continues to present with flexor tone, and tightness through the RUE, wrist, and digits, however was able to achieve increased PROM in the right shoulder, and active ROM for reps of wrist extension. Pt. continues to respond well to the session with less tone, tightness, and increased ROM noted throughout the RUE following ROM, and manual therapy. Pt. presented with increased tightness with right shoulder abduction, and flexion. Pt. continues to work on normalizing tone, and facilitating active volitional movement in the RUE, and hand to improve engagement of the RUE during ADLs, as well as improve transfers, and functional mobility for toileting.         PATIENT EDUCATION: Education details: dressing techniques, HEP Person educated: Patient Education method: Medical illustrator Education comprehension: verbalized understanding, returned demonstration, verbal cues required, and tactile cues required   HOME EXERCISE PROGRAM   Continue with ongoing  HEPs for ROM to the RUE, hand, and digits.    OT Short Term Goals -       OT SHORT TERM GOAL #1   Title Pt and caregiver will be independent with HEP for RUE    Baseline Eval: No current program. 10th visit: Caregivers  report being comfortable with exercises at home. 01/06/2022: Continue ongoing HEPs for UE. 30th visit: Pt./caregivers to continue with ROM; 40th: pt/caregivers to continue with PROM throughout RUE   Time 6   Period Weeks    Status On-going    Target Date 05/16/2022             OT Long Term Goals -       OT LONG TERM GOAL #1   Title Pt. will engage the RUE as a gross assist/atabilizer during ADLs 100% of the time    Baseline 04/04/2022: Pt. Is using her right hand to hold the loofa, and stabilize items.30th visit: Pt. is initiating using her RUE as a gross stabilizer with maxA 01/06/2022: Pt. is starting to engage her RUE as a gross stabiliizer with increased cues. Eval: pt. does not currently engage her RUE during ADLs. 10th visit: Pt. continues to need to work  towards using her RUE as a gross stabilizer.    Time 12    Status On-going    Target Date 06/27/22      OT LONG TERM GOAL #2   Title Pt. will perform UE dressing using one armed dressing techniques    Baseline Eval: MaxA, 10th visit: ModA 01/06/2022: min-modA 30th visit; min0-modA, 04/04/2022: minA donning loose shirts, MaxA tighter items/sports bra, independent doffing a shirt and bra    Time 12    Period Weeks    Status On-going    Target Date 06/27/22      OT LONG TERM GOAL #3   Title Pt. will require minA to stand and hike pants    Baseline Eval: MaxA, 10th visit: ModA 01/06/2022: ModA 30th visit: ModA 04/04/2022: independent with elastic pants if above the knees. MaxA if they fall below the knee; 04/07/22: min guard-min A to reach below knees to simulate hiking pants   Time 12    Period Weeks    Status On-going    Target Date 06/27/22      OT LONG TERM GOAL #4   Title Pt. will perform LE dressing with  minA    Baseline Eval: MaxA 10th visit: Pt. requires MaxA for donning her brace, shoes, and socks. ModA with pants. 01/06/2022: ModA pants, MaxA shoes, socks, and brace 30th visit: ModA pants, MaxA shoes, socks, and brace. 04/04/2022: MaxA; 04/07/22: pt can don/doff L sock and shoe with supv, dep for RLE; see above for hiking pants   Time 12    Period Weeks    Status On-going    Target Date 06/27/22      OT LONG TERM GOAL #5   Title Pt. will improve FOTO score by 2 pt. to reflect funcational change    Baseline Eval: FOTO score: 12, 10th visit: FOTO score 27 01/06/2022: 27 30th visit TBA at next visit secondary to no family available during the session; 04/04/22: FOTO 40   Time 12    Period Weeks    Status On-going    Target Date 06/27/22      OT LONG TERM GOAL #6   Title Patient will transfer to bedside commode or toilet with supervision.    Baseline 30th visit: min-modA. 01/06/2022: min-ModA 04/04/2022: Min-ModA   Time 4    Period Weeks    Status On-going    Target Date 06/27/22        OT LONG TERM GOAL #7                                                      Title: Pt. Will improve AROM wrist extension by 5 degrees in preparation for improving functional  reaching.                                                                                  Baseline:       7/03: -64(40)            Time: 4  Period: Weeks           Status: Ongoing           Target Date: 06/27/2022   Plan - 03/24/22 1429     Clinical Impression Statement Pt.'s BP 128/79 HR 56 bpms. Pt. is improving with using one armed dressing techniques for donning a button down shirt. Pt. was able to consistently thread her right hand, and forearm through the sleeve. Pt. required assist to thread the right sleeve around her elbow, and pull the sleeve up from the elbow to the shoulder. Pt. has improved with efficiently reaching around the back of her neck for the shirt collar, and donn her left shoulder through. Pt. continues to  Improve with each trial. Pt. continues to require increased verbal, and tactile cues to perform self-ROM to the right shoulder. Pt. required fewer cues for ROM to the right wrist. Pt. required tactile, and verbal cues for soft tissue massage to the right forearm. Pt. continues to present with flexor tone, and tightness through the RUE, wrist, and digits, however was able to achieve increased PROM in the right shoulder, and active ROM for reps of wrist extension. Pt. continues to respond well to the session with less tone, tightness, and increased ROM noted throughout the RUE following ROM, and manual therapy. Pt. presented with increased tightness with right shoulder abduction, and flexion. Pt. continues to work on normalizing tone, and facilitating active volitional movement in the RUE, and hand to improve engagement of the RUE during ADLs, as well as improve transfers, and functional mobility for toileting.                            OT Occupational Profile and History Detailed Assessment- Review of Records and additional review of physical, cognitive, psychosocial history related to current functional performance    Occupational performance deficits (Please refer to evaluation for details): Leisure;IADL's;ADL's    Body Structure / Function / Physical Skills ADL;IADL;FMC;ROM;UE functional use;Strength;Decreased knowledge of use of DME;Coordination    Rehab Potential Fair    Clinical Decision Making Several treatment options, min-mod task modification necessary    Comorbidities Affecting Occupational Performance: May have comorbidities impacting occupational performance    Modification or Assistance to Complete Evaluation  Min-Moderate modification of tasks or assist with assess necessary to complete eval    OT Frequency 2x / week    OT Duration 12 weeks    OT Treatment/Interventions Self-care/ADL training;Neuromuscular education;Electrical Stimulation;Patient/family education;Therapeutic  activities;Passive range of motion;Building services engineer;Therapeutic exercise;DME and/or AE instruction;Moist Heat    Consulted and Agree with Plan of Care Patient             Olegario Messier, MS, OTR/L   Olegario Messier, OT 06/09/2022, 3:24 PM

## 2022-06-14 ENCOUNTER — Encounter: Payer: Medicare Other | Admitting: Speech Pathology

## 2022-06-14 ENCOUNTER — Ambulatory Visit: Payer: Medicare Other | Admitting: Occupational Therapy

## 2022-06-16 ENCOUNTER — Ambulatory Visit: Payer: Medicare Other | Admitting: Occupational Therapy

## 2022-06-16 ENCOUNTER — Encounter: Payer: Self-pay | Admitting: Occupational Therapy

## 2022-06-16 ENCOUNTER — Ambulatory Visit: Payer: Medicare Other

## 2022-06-16 ENCOUNTER — Encounter: Payer: Medicare Other | Admitting: Speech Pathology

## 2022-06-16 DIAGNOSIS — R482 Apraxia: Secondary | ICD-10-CM

## 2022-06-16 DIAGNOSIS — I63512 Cerebral infarction due to unspecified occlusion or stenosis of left middle cerebral artery: Secondary | ICD-10-CM

## 2022-06-16 DIAGNOSIS — R262 Difficulty in walking, not elsewhere classified: Secondary | ICD-10-CM

## 2022-06-16 DIAGNOSIS — R278 Other lack of coordination: Secondary | ICD-10-CM

## 2022-06-16 DIAGNOSIS — R2681 Unsteadiness on feet: Secondary | ICD-10-CM

## 2022-06-16 DIAGNOSIS — M6281 Muscle weakness (generalized): Secondary | ICD-10-CM

## 2022-06-16 DIAGNOSIS — R269 Unspecified abnormalities of gait and mobility: Secondary | ICD-10-CM

## 2022-06-16 NOTE — Therapy (Signed)
OUTPATIENT PHYSICAL THERAPY TREATMENT NOTE   Patient Name: Lindsey [name] MRN: [id number] DOB:April 24, 1956, 66 y.o., female Today's Date: 06/16/2022  PCP: Dr. Tomasa Hose MD REFERRING PROVIDER: Leeroy Cha MD   PT End of Session - 06/16/22 0955     Visit Number 47    Number of Visits 72    Date for PT Re-Evaluation 07/28/22    Authorization Type Medicare    Authorization Time Period Cert through 11/06/56; Recert 0/06/9832-05/04/5052; Recert 06/09/6733-19/37    Progress Note Due on Visit 50    PT Start Time 1100    PT Stop Time 1144    PT Time Calculation (min) 44 min    Equipment Utilized During Treatment Gait belt    Activity Tolerance No increased pain;Patient tolerated treatment well    Behavior During Therapy [facility] for tasks assessed/performed                    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 No pertinent surgical history. 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 ([place]) 09/24/2021   Cerebral embolism with cerebral infarction 09/23/2021   Pyelonephritis 09/19/2021   Pain due to onychomycosis of toenails of both feet 11/19/2020   Normocytic anemia 05/31/2020   Acute ischemic left MCA stroke ([place]) 04/30/2020   Right hemiplegia ([place]) 04/30/2020   Cerebrovascular accident  ([place]) 04/21/2020   Atrial fibrillation with RVR ([place]) 04/21/2020   Essential hypertension 04/21/2020   Alcohol abuse 04/21/2020    REFERRING DIAG: s/p CVA  THERAPY DIAG:  Muscle weakness (generalized)  Unsteadiness on feet  Difficulty in walking, not elsewhere classified  Abnormality of gait and mobility  Rationale for Evaluation and Treatment Rehabilitation  PERTINENT HISTORY: Pt was doing very well and was walking short distances wqith a cane prior to second stroke in december. At this point she is limited to wheelchair as her main means of mobility. Pt caregiver reports all detailed information. Pt has hesitation with the left lower extremity and significant weakness on the right lower extremity. Pt will take 2-3 steps when transfering. Pt caregiver assists with all transfers.Pt has shower with ledge to steps over ledge when entering and utilizes bedside commode. Pt has 2 level home with chair lift for upstairs access. Pt would like to improve ransfers, improve neglect on her R side and improve her overall strength and mobility.Pt. is a 66 y.o. 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 Pt. has Right sided weakness, and receptive, and expressive aphasia. Pt. has had a previous CVA with right sided weakness in July 2021. PMHx includes: AFib, Renal Artery thrombosis, situational depression, HTN, Pyelnephritis, Seizure activity, COnstipation, AKI. Vitamin D deficiency, and urinary incontinence.  PRECAUTIONS: fall risk, wearing RLE AFO  SUBJECTIVE: Patient reports no falls or LOB, reports no pain today.   PAIN:  Are you having pain? No      TODAY'S TREATMENT:  06/16/22   INTERVENTIONS:     Amb  in // bars - down and back x 3 lengths of // bars with left UE on railing, CGA and VC to stand erect and look forward. Patient performed well with walking yet struggled with turning and advancing right LE-  continued VC for instruction for safe STS technique  March into RTB across // bars 10x each LE stabilization to RLE provided.  Lateral stepping 2x length of // bars  Static stand throw balls into basketball hoop x15, reaching accros body with LUE   Hedgehog taps alternating x 10 each LE   Seated on airex pad Seated knee extension 10x each LE Seated hip flexion 10x each LE with AAROM RLE Seated contract/relax 10x each side  Seated lateral step 10x each side AAROM RLE Soccer ball kicks for coordination and reaction timing seated x 4 minutes   PATIENT EDUCATION: Education details: Pt educated throughout session about proper posture and technique with exercises. Improved exercise technique, movement at target joints, use of target muscles after min to mod verbal, visual, tactile cues. Continued education on technique with STS.  Person educated: Patient Education method: Explanation, Demonstration, and Verbal cues Education comprehension: verbalized understanding, returned demonstration, and needs further education   HOME EXERCISE PROGRAM: Continue as previously given;    PT Short Term Goals -       PT SHORT TERM GOAL #1   Title Patient will be independent in home exercise program to improve strength/mobility for better functional independence with ADLs.    Baseline no formal HEp for LE/balance at this time; 02/08/2022=Patient continues to require VC for reminders and assist with hip march/knee ext (seated for strengthening); Reminders to continue to work on static stand at home.    Time 4    Period Weeks    Status Partially Met  04/07/22: complete somewhat regularly     Target Date 12/14/21              PT Long Term Goals -       PT LONG TERM GOAL #1   Title Patient will increase FOTO score to equal to or greater than   41  to demonstrate statistically significant improvement in mobility and quality of life.    Baseline 16 on 2/14; 02/08/2022=Patient unable to complete  today- No family present but will attempt again next visit available with family present to assist in completion; 02/17/2022= 40    Time 12    Period Weeks    Status On-going    Target Date 07/28/2022       PT LONG TERM GOAL #3   Title Patient  will complete five times sit to stand test in < 30 seconds with UE assistance indicating an increased LE strength and improved balance.    Baseline 58.47 s with UE assist and with A from PT for R LE positioning; 02/08/2022=54 sec with use of L UE support and Min physical assist to position right LE.  7/6: 30.5 sec, min A for LE positioning, some attempts did not come to full erect posture, did not let go with L UE from armchair with any attempt 05/05/22: 26.65 sec    Time 12    Period Weeks    Status Met    Target Date  05/03/22      PT LONG TERM GOAL #4   Title Patient will increase Berg Balance score by > 6 points to demonstrate decreased fall risk during functional activities.    Baseline 12 on 2/14; 02/08/2022= 13/56  04/07/22: 17  8/3: 19    Time 12    Period Weeks    Status Met    Target Date 05/03/22      PT LONG TERM GOAL #5   Title Pt will transfer from seated position on table/chair to/from transport chair/WC in order to indicate improved transfer ability.    Baseline Requires Min A for LE positioning and to prevent LOB. 02/08/2022=Patient attempted to perform on her own- Unable to push herself up from chair- Very min assist to position right LE into more flexed position and VC for hand placement. Patient able to stand with min Assist with use of gait belt and VC to lean; 02/17/2022=Patient was able to transfer from transport w/c with some physical assist with Right to swing to pivot to side of chair. She was able to sit to stand today with only CGA and then stand pivot with right arm holding onto arm of chair. Only difficulty was stand to sit- Max VC not to plop into chair. 04/07/22: Pt performed movement of B LE to side of chair independently with  chair transfer. Pt required min A with R LE foot placement of floor. Pt requires increased time to complete but complete with CGA only for safety and no cues or A to prevent falling/ plopping into armchair.  8/3: Pt requires min A for maintenance of standig balance, able to navigate from transport chairr to arm chair with hemiwalker, does not controll descent with ahands still " flops" into chair    Time 12    Period Weeks    Status Partially met    Target Date 07/28/2022       PT LONG TERM GOAL #6   Title Patient will ambulate > 150 feet with CGA using Hemiwalker on level surfaces for improved household and short community distances.    Baseline 02/08/2022= 40 feet with use of HW, heavy CGA with max VC for gait sequencing and w/c follow. 5/18 = 85 feet with use of HW, close CGA  7/6: 45 feet, heavy use of hemiwalker 8/3: 85 feet with heavy hemi walker use, no Lob throughout, cues for appropriate weight distribution    Time 12    Period Weeks    Status On-going    Target Date 07/28/2022        PT LONG TERM GOAL #7   Title Pt will increase 10MWT by at least 0.13 m/s in order to demonstrate clinically significant improvement in community ambulation.    Baseline 02/08/2022= 0.05 m/s using HW 7/6: .037ms with heavy ue use of hemiwalker and CGA for balance 8/3: .0581m   Time 12    Period Weeks    Status Ongoing     Target Date 07/28/2022      8.  Pt will improve 5X STS with full knee extension in standing each rep and with appropriate eccentric control in order to indicate improved transfer efficacy and improved LE strength  Baseline: 52.45 sec with UE support Goal status: INITIAL  9.  Patient will increase Berg Balance score by > 6 points to demonstrate decreased fall risk during functional activities Baseline: 19 Goal status: INITIAL           Plan -  Clinical Impression Statement Patient presents with excellent motivation throughout physical therapy session. She does  continue to exhibit challenges with eccentric control sitting down into chair but improves with cueing. She is able to reach cross body and remain static standing without loss of balance.   Patient continues to progress with lower extremity strengthening and ambulation and will continue to benefit from skilled physical therapy to improve her lower extremity strength, balance, improve her transfer ability, and improve her quality of life.    Personal Factors and Comorbidities Age;Comorbidity 1;Comorbidity 2;Comorbidity 3+;Time since onset of injury/illness/exacerbation;Other    Comorbidities AFib, Renal Artery thrombosis, situational depression, HTN, Pyelnephritis, Seizure activity, COnstipation, AKI    Examination-Activity Limitations Bathing;Bed Mobility;Carry;Locomotion Level;Dressing;Sit;Squat;Stairs;Stand;Toileting;Transfers    Examination-Participation Restrictions Church;Community Activity;Laundry    Stability/Clinical Decision Making Evolving/Moderate complexity    Rehab Potential Fair    PT Frequency 2x / week    PT Duration 12 weeks    PT Treatment/Interventions ADLs/Self Care Home Management;Gait training;Stair training;Therapeutic activities;Therapeutic exercise;Balance training;Neuromuscular re-education;Patient/family education;Wheelchair mobility training;Manual techniques;Energy conservation;Dry needling;Passive range of motion;Taping;Spinal Manipulations;Joint Manipulations    PT Next Visit Plan Gait training, Transfer training, LE strengthening as appropriate.    PT Home Exercise Plan HEP: general LE and postural exercises    Consulted and Agree with Plan of Care Patient            Janna Arch PT   06/16/22, 12:14 PM

## 2022-06-16 NOTE — Therapy (Signed)
OUTPATIENT OCCUPATIONAL THERAPY TREATMENT NOTE   Patient Name: Lindsey [name] MRN: [id number] DOB:1956/09/17, 66 y.o., female Today's Date: 06/16/2022   REFERRING PROVIDER: Karie Fetch, MD   OT End of Session - 06/16/22 0944     Visit Number 56    Number of Visits 72    Date for OT Re-Evaluation 06/27/22    Authorization Type Progress report period starting 02/17/22    OT Start Time 0945    OT Stop Time 1030    OT Time Calculation (min) 45 min    Equipment Utilized During Treatment transport chair    Activity Tolerance Patient tolerated treatment well    Behavior During Therapy WFL for tasks assessed/performed               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 No pertinent surgical history. 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) 09/24/2021   Cerebral embolism with cerebral infarction 09/23/2021   Pyelonephritis 09/19/2021   Pain due to onychomycosis of toenails of both feet 11/19/2020   Normocytic anemia 05/31/2020   Acute ischemic left MCA stroke (HCC) 04/30/2020   Right hemiplegia (HCC) 04/30/2020   Cerebrovascular accident (HCC) 04/21/2020   Atrial fibrillation with RVR (HCC) 04/21/2020   Essential hypertension 04/21/2020   Alcohol abuse 04/21/2020    RUE  Measurements (Taken 04/04/22):   PROM Shoulder flexion:  7/03: 0(108)  5/18: 0(105)  PROM Shoulder abduction: 7/03: 4(259)  5/18: 0(102)  PROM Elbow:   7/03: 0(0-140)  5/18: 0(0-136)  AROM Wrist extension:  7/03: -56(38)  5/18: -40(72)  REFERRING DIAG: CVA  THERAPY DIAG:  Other lack of coordination  Acute ischemic left MCA stroke (HCC)  Apraxia  Muscle weakness (generalized)  Rationale for Evaluation and Treatment Rehabilitation  PERTINENT HISTORY: Pt. is a 66 y.o. 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 Pt. has Right sided weakness, and receptive, and expressive aphasia. Pt. has had a previous CVA with right sided weakness in July 2021. PMHx includes: AFib, Renal Artery thrombosis, situational depression, HTN, Pyelnephritis, Seizure activity, COnstipation, AKI. Vitamin D deficiency, and urinary incontinence/  PRECAUTIONS: Fall   SUBJECTIVE: Pt reports she works on stretching her hand every day   PAIN:  Are you having pain? No   OBJECTIVE:   TODAY'S TREATMENT:   Rationale for Evaluation and Treatment Rehabilitation  OT TREATMENT     Neuromuscular Re-education: Pt worked on gravity eliminated elbow flexion and shoulder flexion using sheet to decrease friction across tabletop and across lap. Pt completes full arc across tabletop with tapping facilitation to activate muscles. Completed active RUE supination with support proximally, cues to prevent LUE from assisting.   Therapeutic Exercise: Pt tolerated PROM  through all joint ranges of the RUE and hand. Pt education was provided about opportunities to engage the RUE during daily tasks. Completed chair<>mat SPT x2 trials with MIN A + cues for RLE positioning.       PATIENT EDUCATION: Education details: dressing techniques, HEP Person educated: Patient Education method: Medical illustrator Education comprehension: verbalized  understanding, returned demonstration, verbal cues required, and tactile cues required   HOME EXERCISE PROGRAM   Continue with ongoing HEPs for ROM to the RUE, hand, and digits.    OT Short Term Goals -       OT SHORT TERM GOAL #1   Title Pt and caregiver will be independent with HEP for RUE    Baseline Eval: No current program. 10th visit: Caregivers  report being comfortable with exercises at home. 01/06/2022: Continue ongoing HEPs for UE. 30th visit: Pt./caregivers to continue with ROM; 40th: pt/caregivers to continue with PROM throughout RUE   Time 6   Period Weeks    Status On-going    Target Date 05/16/2022             OT Long Term Goals -       OT LONG TERM GOAL #1   Title Pt. will engage the RUE as a gross assist/atabilizer during ADLs 100% of the time    Baseline 04/04/2022: Pt. Is using her right hand to hold the loofa, and stabilize items.30th visit: Pt. is initiating using her RUE as a gross stabilizer with maxA 01/06/2022: Pt. is starting to engage her RUE as a gross stabiliizer with increased cues. Eval: pt. does not currently engage her RUE during ADLs. 10th visit: Pt. continues to need to work towards using her RUE as a gross stabilizer.    Time 12    Status On-going    Target Date 06/27/22      OT LONG TERM GOAL #2   Title Pt. will perform UE dressing using one armed dressing techniques    Baseline Eval: MaxA, 10th visit: ModA 01/06/2022: min-modA 30th visit; min0-modA, 04/04/2022: minA donning loose shirts, MaxA tighter items/sports bra, independent doffing a shirt and bra    Time 12    Period Weeks    Status On-going    Target Date 06/27/22      OT LONG TERM GOAL #3   Title Pt. will require minA to stand and hike pants    Baseline Eval: MaxA, 10th visit: ModA 01/06/2022: ModA 30th visit: ModA 04/04/2022: independent with elastic pants if above the knees. MaxA if they fall below the knee; 04/07/22: min guard-min A to reach below knees to simulate hiking pants    Time 12    Period Weeks    Status On-going    Target Date 06/27/22      OT LONG TERM GOAL #4   Title Pt. will perform LE dressing with minA    Baseline Eval: MaxA 10th visit: Pt. requires MaxA for donning her brace, shoes, and socks. ModA with pants. 01/06/2022: ModA pants, MaxA shoes, socks, and brace 30th visit: ModA pants, MaxA shoes, socks, and brace. 04/04/2022: MaxA; 04/07/22: pt can don/doff L sock and shoe with supv, dep for RLE; see above for hiking pants   Time 12    Period Weeks    Status On-going    Target Date 06/27/22      OT LONG TERM GOAL #5   Title Pt. will improve FOTO score by 2 pt. to reflect funcational change    Baseline Eval:  FOTO score: 12, 10th visit: FOTO score 27 01/06/2022: 27 30th visit TBA at next visit secondary to no family available during the session; 04/04/22: FOTO 40   Time 12    Period Weeks    Status On-going    Target Date 06/27/22      OT LONG TERM GOAL #6   Title Patient will transfer to bedside commode or toilet with supervision.    Baseline 30th visit: min-modA. 01/06/2022: min-ModA 04/04/2022: Min-ModA   Time 4    Period Weeks    Status On-going    Target Date 06/27/22        OT LONG TERM GOAL #7                                                      Title: Pt. Will improve AROM wrist extension by 5 degrees in preparation for improving functional  reaching.                                                                                  Baseline:       7/03: -28(72)            Time: 4           Period: Weeks           Status: Ongoing           Target Date: 06/27/2022   Plan - 03/24/22 1429     Clinical Impression Statement Pt. continues to require increased verbal, and tactile cues to perform self-ROM to the right shoulder. Pt. continues to present with flexor tone, and tightness through the RUE, wrist, and digits, however was able to achieve increased PROM in the right shoulder, and active ROM for R forearm supination and elbow flexion in  gravity eliminated plane - facilitated with tapping. MIN A for SPT chair<>mat, min cues for foot placement. Pt. continues to respond well to the session with less tone, tightness, and increased ROM noted throughout the RUE following ROM. Pt. continues to work on normalizing tone, and facilitating active volitional movement in the RUE, and hand to improve engagement of the RUE during ADLs, as well as improve transfers, and functional mobility for toileting.         OT Occupational Profile and History Detailed Assessment- Review of Records and additional review of physical, cognitive, psychosocial history related to current functional performance    Occupational performance deficits (Please refer to evaluation for details): Leisure;IADL's;ADL's    Body Structure / Function / Physical Skills ADL;IADL;FMC;ROM;UE functional use;Strength;Decreased knowledge of use of DME;Coordination    Rehab Potential Fair    Clinical Decision Making Several treatment options, min-mod task modification necessary    Comorbidities Affecting Occupational Performance: May have comorbidities impacting occupational performance    Modification or Assistance to Complete Evaluation  Min-Moderate modification of tasks or assist with assess necessary to complete eval    OT Frequency 2x / week    OT Duration 12 weeks    OT Treatment/Interventions Self-care/ADL  training;Neuromuscular education;Electrical Stimulation;Patient/family education;Therapeutic activities;Passive range of motion;Therapist, nutritional;Therapeutic exercise;DME and/or AE instruction;Moist Heat    Consulted and Agree with Plan of Care Patient             Dessie Coma, M.S. OTR/L  06/16/22, 10:34 AM  ascom [phone]   Leonides Cave, OT 06/16/2022, 10:34 AM

## 2022-06-20 ENCOUNTER — Other Ambulatory Visit: Payer: Self-pay | Admitting: Physical Medicine and Rehabilitation

## 2022-06-21 ENCOUNTER — Ambulatory Visit: Payer: Medicare Other | Admitting: Occupational Therapy

## 2022-06-21 ENCOUNTER — Encounter: Payer: Medicare Other | Admitting: Speech Pathology

## 2022-06-21 ENCOUNTER — Ambulatory Visit: Payer: Medicare Other

## 2022-06-21 ENCOUNTER — Encounter: Payer: Self-pay | Admitting: Occupational Therapy

## 2022-06-21 DIAGNOSIS — I63542 Cerebral infarction due to unspecified occlusion or stenosis of left cerebellar artery: Secondary | ICD-10-CM

## 2022-06-21 DIAGNOSIS — I63512 Cerebral infarction due to unspecified occlusion or stenosis of left middle cerebral artery: Secondary | ICD-10-CM

## 2022-06-21 DIAGNOSIS — M6281 Muscle weakness (generalized): Secondary | ICD-10-CM

## 2022-06-21 DIAGNOSIS — R262 Difficulty in walking, not elsewhere classified: Secondary | ICD-10-CM

## 2022-06-21 DIAGNOSIS — R2681 Unsteadiness on feet: Secondary | ICD-10-CM

## 2022-06-21 DIAGNOSIS — R278 Other lack of coordination: Secondary | ICD-10-CM

## 2022-06-21 DIAGNOSIS — R2689 Other abnormalities of gait and mobility: Secondary | ICD-10-CM

## 2022-06-21 DIAGNOSIS — R269 Unspecified abnormalities of gait and mobility: Secondary | ICD-10-CM

## 2022-06-21 DIAGNOSIS — R482 Apraxia: Secondary | ICD-10-CM

## 2022-06-21 NOTE — Therapy (Signed)
OUTPATIENT PHYSICAL THERAPY TREATMENT NOTE   Patient Name: Lindsey [name] MRN: [id number] DOB:Nov 16, 1955, 66 y.o., female Today's Date: 06/22/2022  PCP: Dr. Tomasa Hose MD REFERRING PROVIDER: Leeroy Cha MD   PT End of Session - 06/21/22 1106     Visit Number 48    Number of Visits 31    Date for PT Re-Evaluation 07/28/22    Authorization Type Medicare    Authorization Time Period Cert through 0/9/23; Recert 3/0/0762-11/08/3333; Recert 01/06/6255-38/93    Progress Note Due on Visit 50    PT Start Time 1101    PT Stop Time 1144    PT Time Calculation (min) 43 min    Equipment Utilized During Treatment Gait belt    Activity Tolerance No increased pain;Patient tolerated treatment well    Behavior During Therapy [facility] for tasks assessed/performed                     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 No pertinent surgical history. 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 ([place]) 09/24/2021   Cerebral embolism with cerebral infarction 09/23/2021   Pyelonephritis 09/19/2021   Pain due to onychomycosis of toenails of both feet 11/19/2020   Normocytic anemia 05/31/2020   Acute ischemic left MCA stroke ([place]) 04/30/2020   Right hemiplegia ([place]) 04/30/2020   Cerebrovascular accident  ([place]) 04/21/2020   Atrial fibrillation with RVR ([place]) 04/21/2020   Essential hypertension 04/21/2020   Alcohol abuse 04/21/2020    REFERRING DIAG: s/p CVA  THERAPY DIAG:  Acute ischemic left MCA stroke (HCC)  Other lack of coordination  Muscle weakness (generalized)  Unsteadiness on feet  Difficulty in walking, not elsewhere classified  Abnormality of gait and mobility  Other abnormalities of gait and mobility  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 At this point she is limited to wheelchair as her main means of mobility. Pt caregiver reports all detailed information. Pt has hesitation with the left lower extremity and significant weakness on the right lower extremity. Pt will take 2-3 steps when transfering. Pt caregiver assists with all transfers.Pt has shower with ledge to steps over ledge when entering and utilizes bedside commode. Pt has 2 level home with chair lift for upstairs access. Pt would like to improve ransfers, improve neglect on her R side and improve her overall strength and mobility.Pt. is a 66 y.o. 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 Pt. has Right sided weakness, and receptive, and expressive aphasia. Pt.  has had a previous CVA with right sided weakness in July 2021. PMHx includes: AFib, Renal Artery thrombosis, situational depression, HTN, Pyelnephritis, Seizure activity, COnstipation, AKI. Vitamin D deficiency, and urinary incontinence.   PRECAUTIONS: fall risk, wearing RLE AFO  SUBJECTIVE: Patient reports doing okay today. Reports frustrated with hand today and the fact she is struggling to keep it stretched out- Denies any falls  PAIN:  Are you  having pain? No      TODAY'S TREATMENT:  06/21/22   INTERVENTIONS:   Therex:  Standing Hip March into RTB across // bars 10x each LE stabilization to RLE provided. Standing Hip abd (AROM) in // bars x10 reps each LE with CGA- Difficulty stabilizing on right LE   Amb  in // bars - down and back x 5 lengths of // bars with left UE on railing, CGA and VC to stand erect and look forward. Patient exhibited improved overall step symmetry yet difficulty keeping right LE on right side (using old tape line for feedback) - continued VC for instruction for safe STS technique and to stand erect and look in mirror to maintain posture.   Lateral stepping 2x length of // bars Seated on airex pad Seated knee extension 10x each LE Seated hip flexion 10x each LE with AAROM RLE Seated lateral step up and over 2X4 board 12x each side AAROM RLE Soccer ball kicks for coordination and reaction timing seated x 4 minutes   PATIENT EDUCATION: Education details: Pt educated throughout session about proper posture and technique with exercises. Improved exercise technique, movement at target joints, use of target muscles after min to mod verbal, visual, tactile cues. Continued education on technique with STS.  Person educated: Patient Education method: Explanation, Demonstration, and Verbal cues Education comprehension: verbalized understanding, returned demonstration, and needs further education   HOME EXERCISE PROGRAM: Continue as previously given;    PT Short Term Goals -       PT SHORT TERM GOAL #1   Title Patient will be independent in home exercise program to improve strength/mobility for better functional independence with ADLs.    Baseline no formal HEp for LE/balance at this time; 02/08/2022=Patient continues to require VC for reminders and assist with hip march/knee ext (seated for strengthening); Reminders to continue to work on static stand at home.    Time 4    Period Weeks    Status  Partially Met  04/07/22: complete somewhat regularly     Target Date 12/14/21              PT Long Term Goals -       PT LONG TERM GOAL #1   Title Patient will increase FOTO score to equal to or greater than   41  to demonstrate statistically significant improvement in mobility and quality of life.    Baseline 16 on 2/14; 02/08/2022=Patient unable to complete today- No family present but will attempt again next visit available with family present to assist in completion; 02/17/2022= 40    Time 12    Period Weeks    Status On-going    Target Date 07/28/2022       PT LONG TERM GOAL #3   Title Patient  will complete five times sit to stand test in < 30 seconds with UE assistance indicating an increased LE strength and improved balance.    Baseline 58.47 s with UE assist and with A from PT for R LE positioning; 02/08/2022=54 sec with use of L UE  support and Min physical assist to position right LE.  7/6: 30.5 sec, min A for LE positioning, some attempts did not come to full erect posture, did not let go with L UE from armchair with any attempt 05/05/22: 26.65 sec    Time 12    Period Weeks    Status Met    Target Date 05/03/22      PT LONG TERM GOAL #4   Title Patient will increase Berg Balance score by > 6 points to demonstrate decreased fall risk during functional activities.    Baseline 12 on 2/14; 02/08/2022= 13/56  04/07/22: 17  8/3: 19    Time 12    Period Weeks    Status Met    Target Date 05/03/22      PT LONG TERM GOAL #5   Title Pt will transfer from seated position on table/chair to/from transport chair/WC in order to indicate improved transfer ability.    Baseline Requires Min A for LE positioning and to prevent LOB. 02/08/2022=Patient attempted to perform on her own- Unable to push herself up from chair- Very min assist to position right LE into more flexed position and VC for hand placement. Patient able to stand with min Assist with use of gait belt and VC to lean;  02/17/2022=Patient was able to transfer from transport w/c with some physical assist with Right to swing to pivot to side of chair. She was able to sit to stand today with only CGA and then stand pivot with right arm holding onto arm of chair. Only difficulty was stand to sit- Max VC not to plop into chair. 04/07/22: Pt performed movement of B LE to side of chair independently with chair transfer. Pt required min A with R LE foot placement of floor. Pt requires increased time to complete but complete with CGA only for safety and no cues or A to prevent falling/ plopping into armchair.  8/3: Pt requires min A for maintenance of standig balance, able to navigate from transport chairr to arm chair with hemiwalker, does not controll descent with ahands still " flops" into chair    Time 12    Period Weeks    Status Partially met    Target Date 07/28/2022       PT LONG TERM GOAL #6   Title Patient will ambulate > 150 feet with CGA using Hemiwalker on level surfaces for improved household and short community distances.    Baseline 02/08/2022= 40 feet with use of HW, heavy CGA with max VC for gait sequencing and w/c follow. 5/18 = 85 feet with use of HW, close CGA  7/6: 45 feet, heavy use of hemiwalker 8/3: 85 feet with heavy hemi walker use, no Lob throughout, cues for appropriate weight distribution    Time 12    Period Weeks    Status On-going    Target Date 07/28/2022        PT LONG TERM GOAL #7   Title Pt will increase 10MWT by at least 0.13 m/s in order to demonstrate clinically significant improvement in community ambulation.    Baseline 02/08/2022= 0.05 m/s using HW 7/6: .096ms with heavy ue use of hemiwalker and CGA for balance 8/3: .0576m   Time 12    Period Weeks    Status Ongoing     Target Date 07/28/2022      8.  Pt will improve 5X STS with full knee extension in standing each rep and with appropriate eccentric control in  order to indicate improved transfer efficacy and improved LE  strength  Baseline: 52.45 sec with UE support Goal status: INITIAL  9.  Patient will increase Berg Balance score by > 6 points to demonstrate decreased fall risk during functional activities Baseline: 19 Goal status: INITIAL           Plan -     Clinical Impression Statement Patient presents with ongoing difficulty with right LE tightness making it difficulty for sit to stand transfers. She was able to complete most therex with just VC - Difficulty with soccer kick with poor overall reaction and functional use of right LE and she will continue to benefit from skilled physical therapy to improve her lower extremity strength, balance, improve her transfer ability, and improve her quality of life.    Personal Factors and Comorbidities Age;Comorbidity 1;Comorbidity 2;Comorbidity 3+;Time since onset of injury/illness/exacerbation;Other    Comorbidities AFib, Renal Artery thrombosis, situational depression, HTN, Pyelnephritis, Seizure activity, COnstipation, AKI    Examination-Activity Limitations Bathing;Bed Mobility;Carry;Locomotion Level;Dressing;Sit;Squat;Stairs;Stand;Toileting;Transfers    Examination-Participation Restrictions Church;Community Activity;Laundry    Stability/Clinical Decision Making Evolving/Moderate complexity    Rehab Potential Fair    PT Frequency 2x / week    PT Duration 12 weeks    PT Treatment/Interventions ADLs/Self Care Home Management;Gait training;Stair training;Therapeutic activities;Therapeutic exercise;Balance training;Neuromuscular re-education;Patient/family education;Wheelchair mobility training;Manual techniques;Energy conservation;Dry needling;Passive range of motion;Taping;Spinal Manipulations;Joint Manipulations    PT Next Visit Plan Gait training, Transfer training, LE strengthening as appropriate.    PT Home Exercise Plan HEP: general LE and postural exercises    Consulted and Agree with Plan of Care Patient            Lewis Moccasin  PT   06/22/22, 10:23 AM

## 2022-06-21 NOTE — Therapy (Signed)
OUTPATIENT OCCUPATIONAL THERAPY TREATMENT NOTE   Patient Name: Lindsey [name] MRN: [id number] DOB:10/23/1955, 66 y.o., female Today's Date: 06/21/2022   REFERRING PROVIDER: Karie Fetch, MD   OT End of Session - 06/21/22 1005     Visit Number 57    Number of Visits 72    Date for OT Re-Evaluation 06/27/22    Authorization Type Progress report period starting 02/17/22    OT Start Time 1000    OT Stop Time 1045    OT Time Calculation (min) 45 min    Equipment Utilized During Treatment transport chair    Activity Tolerance Patient tolerated treatment well    Behavior During Therapy WFL for tasks assessed/performed               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 No pertinent surgical history. 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) 09/24/2021   Cerebral embolism with cerebral infarction 09/23/2021   Pyelonephritis 09/19/2021   Pain due to onychomycosis of toenails of both feet 11/19/2020   Normocytic anemia 05/31/2020   Acute ischemic left MCA stroke (HCC) 04/30/2020   Right hemiplegia (HCC) 04/30/2020   Cerebrovascular accident (HCC) 04/21/2020   Atrial fibrillation with RVR (HCC) 04/21/2020   Essential hypertension 04/21/2020   Alcohol abuse 04/21/2020    RUE  Measurements (Taken 04/04/22):   PROM Shoulder flexion:  7/03: 0(108)  5/18: 0(105)  PROM Shoulder abduction: 7/03: 3(614)  5/18: 0(102)  PROM Elbow:   7/03: 0(0-140)  5/18: 0(0-136)  AROM Wrist extension:  7/03: -43(15)  5/18: -40(72)  REFERRING DIAG: CVA  THERAPY DIAG:  Acute ischemic left MCA stroke (HCC)  Apraxia  Other lack of coordination  Muscle weakness (generalized)  Rationale for Evaluation and Treatment Rehabilitation  PERTINENT HISTORY: Pt. is a 66 y.o. 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 Pt. has Right sided weakness, and receptive, and expressive aphasia. Pt. has had a previous CVA with right sided weakness in July 2021. PMHx includes: AFib, Renal Artery thrombosis, situational depression, HTN, Pyelnephritis, Seizure activity, COnstipation, AKI. Vitamin D deficiency, and urinary incontinence/  PRECAUTIONS: Fall   SUBJECTIVE: Pt reports she works on stretching her hand every day   PAIN:  Are you having pain? No   OBJECTIVE:   TODAY'S TREATMENT:   Rationale for Evaluation and Treatment Rehabilitation  OT TREATMENT     Neuromuscular Re-education: Pt worked on gravity eliminated elbow flexion and shoulder flexion using sheet to decrease friction across tabletop and across lap. Pt completes finger flexion with assist for digit isolation and with wrist in neutral achieves ~75% of full fist, tapping facilitation to activate muscles.   Therapeutic Exercise: Pt tolerated PROM through  all joint ranges of the RUE and hand. Completed SPT with MIN A chair<>chair and sit<>stand at chair for functional balance tasks reaching inside BOS. Tolerates ~3 min standing.      PATIENT EDUCATION: Education details: dressing techniques, HEP Person educated: Patient Education method: Customer service manager Education comprehension: verbalized understanding, returned demonstration,  verbal cues required, and tactile cues required   [place] with ongoing HEPs for ROM to the RUE, hand, and digits.    OT Short Term Goals -       OT SHORT TERM GOAL #1   Title Pt and caregiver will be independent with HEP for RUE    Baseline Eval: No current program. 10th visit: Caregivers  report being comfortable with exercises at home. 01/06/2022: Continue ongoing HEPs for UE. 30th visit: Pt./caregivers to continue with ROM; 40th: pt/caregivers to continue with PROM throughout RUE   Time 6   Period Weeks    Status On-going    Target Date 05/16/2022             OT Long Term Goals -       OT LONG TERM GOAL #1   Title Pt. will engage the RUE as a gross assist/atabilizer during ADLs 100% of the time    Baseline 04/04/2022: Pt. Is using her right hand to hold the loofa, and stabilize items.30th visit: Pt. is initiating using her RUE as a gross stabilizer with maxA 01/06/2022: Pt. is starting to engage her RUE as a gross stabiliizer with increased cues. Eval: pt. does not currently engage her RUE during ADLs. 10th visit: Pt. continues to need to work towards using her RUE as a gross stabilizer.    Time 12    Status On-going    Target Date 06/27/22      OT LONG TERM GOAL #2   Title Pt. will perform UE dressing using one armed dressing techniques    Baseline Eval: MaxA, 10th visit: [place] 01/06/2022: min-modA 30th visit; min0-modA, 04/04/2022: minA donning loose shirts, MaxA tighter items/sports bra, independent doffing a shirt and bra    Time 12    Period Weeks    Status On-going    Target Date 06/27/22      OT LONG TERM GOAL #3   Title Pt. will require minA to stand and hike pants    Baseline Eval: MaxA, 10th visit: [place] 01/06/2022: [place] 30th visit: [place] 04/04/2022: independent with elastic pants if above the knees. MaxA if they fall below the knee; 04/07/22: min guard-min A to reach below knees to simulate hiking pants   Time 12    Period Weeks    Status  On-going    Target Date 06/27/22      OT LONG TERM GOAL #4   Title Pt. will perform LE dressing with minA    Baseline Eval: MaxA 10th visit: Pt. requires MaxA for donning her brace, shoes, and socks. ModA with pants. 01/06/2022: ModA pants, MaxA shoes, socks, and brace 30th visit: ModA pants, MaxA shoes, socks, and brace. 04/04/2022: MaxA; 04/07/22: pt can don/doff L sock and shoe with supv, dep for RLE; see above for hiking pants   Time 12    Period Weeks    Status On-going    Target Date 06/27/22      OT LONG TERM GOAL #5   Title Pt. will improve FOTO score by 2 pt. to reflect funcational change    Baseline Eval: FOTO score: 12, 10th visit: FOTO score  27 01/06/2022: 27 30th visit TBA at next visit secondary to no family available during the session; 04/04/22: FOTO 40   Time 12    Period Weeks    Status On-going    Target Date 06/27/22      OT LONG TERM GOAL #6   Title Patient will transfer to bedside commode or toilet with supervision.    Baseline 30th visit: min-modA. 01/06/2022: min-ModA 04/04/2022: Min-ModA   Time 4    Period Weeks    Status On-going    Target Date 06/27/22        OT LONG TERM GOAL #7                                                      Title: Pt. Will improve AROM wrist extension by 5 degrees in preparation for improving functional  reaching.                                                                                  Baseline:       7/03: -28(72)            Time: 4           Period: Weeks           Status: Ongoing           Target Date: 06/27/2022   Plan - 03/24/22 1429     Clinical Impression Statement Pt requires increased verbal, and tactile cues to perform A/AROM vs self ROM during session. Pt continues to present with flexor tone, and tightness through the RUE, wrist, and digits, however was able to achieve increased PROM in the right shoulder, and AROM for digit flexion - achieves ~75% of full fist. Pt continues to respond well to the session with less  tone, tightness, and increased ROM noted throughout the RUE following ROM. Pt. continues to work on normalizing tone, and facilitating active volitional movement in the RUE, and hand to improve engagement of the RUE during ADLs, as well as improve transfers, and functional mobility for toileting.         OT Occupational Profile and History Detailed Assessment- Review of Records and additional review of physical, cognitive, psychosocial history related to current functional performance    Occupational performance deficits (Please refer to evaluation for details): Leisure;IADL's;ADL's    Body Structure / Function / Physical Skills ADL;IADL;FMC;ROM;UE functional use;Strength;Decreased knowledge of use of DME;Coordination    Rehab Potential Fair    Clinical Decision Making Several treatment options, min-mod task modification necessary    Comorbidities Affecting Occupational Performance: May have comorbidities impacting occupational performance    Modification or Assistance to Complete Evaluation  Min-Moderate modification of tasks or assist with assess necessary to complete eval    OT Frequency 2x / week    OT Duration 12 weeks    OT Treatment/Interventions Self-care/ADL training;Neuromuscular education;Electrical Stimulation;Patient/family education;Therapeutic activities;Passive range of motion;Building services engineer;Therapeutic exercise;DME and/or AE instruction;Moist Heat    Consulted and Agree with Plan of Care  Patient             Kathie Dikeevin Beacher Every, M.S. OTR/L  06/21/22, 10:06 AM  ascom [phone]   Presley Raddleevin J Taj Nevins, OT 06/21/2022, 10:06 AM

## 2022-06-23 ENCOUNTER — Ambulatory Visit: Payer: Medicare Other | Admitting: Occupational Therapy

## 2022-06-23 ENCOUNTER — Encounter: Payer: Medicare Other | Admitting: Speech Pathology

## 2022-06-23 ENCOUNTER — Ambulatory Visit: Payer: Medicare Other

## 2022-06-23 DIAGNOSIS — R269 Unspecified abnormalities of gait and mobility: Secondary | ICD-10-CM

## 2022-06-23 DIAGNOSIS — R262 Difficulty in walking, not elsewhere classified: Secondary | ICD-10-CM

## 2022-06-23 DIAGNOSIS — M6281 Muscle weakness (generalized): Secondary | ICD-10-CM | POA: Diagnosis not present

## 2022-06-23 DIAGNOSIS — R278 Other lack of coordination: Secondary | ICD-10-CM

## 2022-06-23 DIAGNOSIS — I63512 Cerebral infarction due to unspecified occlusion or stenosis of left middle cerebral artery: Secondary | ICD-10-CM

## 2022-06-23 DIAGNOSIS — R2689 Other abnormalities of gait and mobility: Secondary | ICD-10-CM

## 2022-06-23 DIAGNOSIS — R2681 Unsteadiness on feet: Secondary | ICD-10-CM

## 2022-06-23 NOTE — Therapy (Signed)
OUTPATIENT PHYSICAL THERAPY TREATMENT NOTE   Patient Name: Lindsey [name] MRN: [id number] DOB:Dec 03, 1955, 66 y.o., female Today's Date: 06/24/2022  PCP: Dr. Tomasa Hose MD REFERRING PROVIDER: Leeroy Cha MD   PT End of Session - 06/23/22 1107     Visit Number 49    Number of Visits 65    Date for PT Re-Evaluation 07/28/22    Authorization Type Medicare    Authorization Time Period Cert through 01/04/30; Recert 02/03/85-04/07/1949; Recert 06/05/2670-24/58    Progress Note Due on Visit 50    PT Start Time 1102    PT Stop Time 1144    PT Time Calculation (min) 42 min    Equipment Utilized During Treatment Gait belt    Activity Tolerance No increased pain;Patient tolerated treatment well    Behavior During Therapy [facility] for tasks assessed/performed                     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 No pertinent surgical history. 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 ([place]) 09/24/2021   Cerebral embolism with cerebral infarction 09/23/2021   Pyelonephritis 09/19/2021   Pain due to onychomycosis of toenails of both feet 11/19/2020   Normocytic anemia 05/31/2020   Acute ischemic left MCA stroke ([place]) 04/30/2020   Right hemiplegia ([place]) 04/30/2020   Cerebrovascular accident  ([place]) 04/21/2020   Atrial fibrillation with RVR ([place]) 04/21/2020   Essential hypertension 04/21/2020   Alcohol abuse 04/21/2020    REFERRING DIAG: s/p CVA  THERAPY DIAG:  Acute ischemic left MCA stroke (HCC)  Other lack of coordination  Muscle weakness (generalized)  Unsteadiness on feet  Abnormality of gait and mobility  Difficulty in walking, not elsewhere classified  Other abnormalities of gait and mobility  Rationale for Evaluation and Treatment Rehabilitation  PERTINENT HISTORY: Pt was doing very well and was walking short distances wqith a cane prior to second stroke in december. At this point she is limited to wheelchair as her main means of mobility. Pt caregiver reports all detailed information. Pt has hesitation with the left lower extremity and significant weakness on the right lower extremity. Pt will take 2-3 steps when transfering. Pt caregiver assists with all transfers.Pt has shower with ledge to steps over ledge when entering and utilizes bedside commode. Pt has 2 level home with chair lift for upstairs access. Pt would like to improve ransfers, improve neglect on her R side and improve her overall strength and mobility.Pt. is a 66 y.o. 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 Pt. has Right sided weakness, and receptive, and expressive aphasia. Pt. has had a previous CVA with right sided weakness in July 2021. PMHx includes:  AFib, Renal Artery thrombosis, situational depression, HTN, Pyelnephritis, Seizure activity, COnstipation, AKI. Vitamin D deficiency, and urinary incontinence.   PRECAUTIONS: fall risk, wearing RLE AFO  SUBJECTIVE: Patient reports slightly irritated due to the fact that her caregiver brought her 1 hour early  but states doing okay otherwise. Denies any falls at home.   PAIN:  Are you having pain? No      TODAY'S TREATMENT:  06/23/22   INTERVENTIONS:     Manual therapy:   Manual stretching to right hip ER as patient leg presents externally rotated- Very tight rotating into HIP IR- hold 30 sec x 4 Right hamstring stretching- Seated resting on PT's leg- hold 60 sec with light manual overpressure x 4 sets  PROM into right knee flex (initially patient was at 87 deg improved to 112 deg)  PROM to right hip (hip circles - CW and CCW x 20 reps each)   Therex: Quad sets with 5 sec hold right LE (max VC to perform correctly)  Seated leg kick then back into ham curl (AAROM for ham cur) 2 sets  10 reps Seated hip flex/abd up and over hedgehog  Seated knee extension 10x each LE Seated hip flexion 10x each LE with AAROM RLE Seated lateral step up and over 2X4 board 12x each side AAROM RLE  Gait training:   Patient ambulated approx 175 feet today with use of Hemi-walker, CGA, right AFO- Utilizing mostly step to gait -sequencing but occasionally reciprocal steps. She presented with 3-4 episodes of unsteadiness with post LOB requiring Mod assist to catch but otherwise CGA.   PATIENT EDUCATION: Education details: Pt educated throughout session about proper posture and technique with exercises. Improved exercise technique, movement at target joints, use of target muscles after min to mod verbal, visual, tactile cues. Continued education on technique with STS.  Person educated: Patient Education method: Explanation, Demonstration, and Verbal cues Education comprehension: verbalized understanding, returned demonstration, and needs further education   HOME EXERCISE PROGRAM: Continue as previously given;    PT Short Term Goals -       PT SHORT TERM GOAL #1   Title Patient will be independent in home exercise program to improve strength/mobility for better functional independence with ADLs.    Baseline no formal HEp for LE/balance at this time; 02/08/2022=Patient continues to require VC for reminders and assist with hip march/knee ext (seated for  strengthening); Reminders to continue to work on static stand at home.    Time 4    Period Weeks    Status Partially Met  04/07/22: complete somewhat regularly     Target Date 12/14/21              PT Long Term Goals -       PT LONG TERM GOAL #1   Title Patient will increase FOTO score to equal to or greater than   41  to demonstrate statistically significant improvement in mobility and quality of life.    Baseline 16 on 2/14; 02/08/2022=Patient unable to complete today- No family present but will attempt again next visit available with family present to assist in completion; 02/17/2022= 40    Time 12    Period Weeks    Status On-going    Target Date 07/28/2022       PT LONG TERM GOAL #3   Title Patient  will complete five times sit to stand test in < 30 seconds with UE assistance indicating an increased LE strength and improved balance.  Baseline 58.47 s with UE assist and with A from PT for R LE positioning; 02/08/2022=54 sec with use of L UE support and Min physical assist to position right LE.  7/6: 30.5 sec, min A for LE positioning, some attempts did not come to full erect posture, did not let go with L UE from armchair with any attempt 05/05/22: 26.65 sec    Time 12    Period Weeks    Status Met    Target Date 05/03/22      PT LONG TERM GOAL #4   Title Patient will increase Berg Balance score by > 6 points to demonstrate decreased fall risk during functional activities.    Baseline 12 on 2/14; 02/08/2022= 13/56  04/07/22: 17  8/3: 19    Time 12    Period Weeks    Status Met    Target Date 05/03/22      PT LONG TERM GOAL #5   Title Pt will transfer from seated position on table/chair to/from transport chair/WC in order to indicate improved transfer ability.    Baseline Requires Min A for LE positioning and to prevent LOB. 02/08/2022=Patient attempted to perform on her own- Unable to push herself up from chair- Very min assist to position right LE into more flexed position  and VC for hand placement. Patient able to stand with min Assist with use of gait belt and VC to lean; 02/17/2022=Patient was able to transfer from transport w/c with some physical assist with Right to swing to pivot to side of chair. She was able to sit to stand today with only CGA and then stand pivot with right arm holding onto arm of chair. Only difficulty was stand to sit- Max VC not to plop into chair. 04/07/22: Pt performed movement of B LE to side of chair independently with chair transfer. Pt required min A with R LE foot placement of floor. Pt requires increased time to complete but complete with CGA only for safety and no cues or A to prevent falling/ plopping into armchair.  8/3: Pt requires min A for maintenance of standig balance, able to navigate from transport chairr to arm chair with hemiwalker, does not controll descent with ahands still " flops" into chair    Time 12    Period Weeks    Status Partially met    Target Date 07/28/2022       PT LONG TERM GOAL #6   Title Patient will ambulate > 150 feet with CGA using Hemiwalker on level surfaces for improved household and short community distances.    Baseline 02/08/2022= 40 feet with use of HW, heavy CGA with max VC for gait sequencing and w/c follow. 5/18 = 85 feet with use of HW, close CGA  7/6: 45 feet, heavy use of hemiwalker 8/3: 85 feet with heavy hemi walker use, no Lob throughout, cues for appropriate weight distribution    Time 12    Period Weeks    Status On-going    Target Date 07/28/2022        PT LONG TERM GOAL #7   Title Pt will increase 10MWT by at least 0.13 m/s in order to demonstrate clinically significant improvement in community ambulation.    Baseline 02/08/2022= 0.05 m/s using HW 7/6: .047ms with heavy ue use of hemiwalker and CGA for balance 8/3: .0586m   Time 12    Period Weeks    Status Ongoing     Target Date 07/28/2022  8.  Pt will improve 5X STS with full knee extension in standing each rep  and with appropriate eccentric control in order to indicate improved transfer efficacy and improved LE strength  Baseline: 52.45 sec with UE support Goal status: INITIAL  9.  Patient will increase Berg Balance score by > 6 points to demonstrate decreased fall risk during functional activities Baseline: 19 Goal status: INITIAL           Plan -     Clinical Impression Statement Patient presented with good motivation for today's session. Treatment focused early on with manual stretching to loosen up right LE to prepare for some gait training. She was initially very tight with right hip and knee but improved with stretching. She later experienced one of her best gait sessions in recent memory. She was able to complete 175 feet without rest stop. She will continued to need lots of cues and assist with form/technique but vastly improved with her overall endurance today. Patient will continue to benefit from skilled physical therapy to improve her lower extremity strength, balance, improve her transfer ability, and improve her quality of life.    Personal Factors and Comorbidities Age;Comorbidity 1;Comorbidity 2;Comorbidity 3+;Time since onset of injury/illness/exacerbation;Other    Comorbidities AFib, Renal Artery thrombosis, situational depression, HTN, Pyelnephritis, Seizure activity, COnstipation, AKI    Examination-Activity Limitations Bathing;Bed Mobility;Carry;Locomotion Level;Dressing;Sit;Squat;Stairs;Stand;Toileting;Transfers    Examination-Participation Restrictions Church;Community Activity;Laundry    Stability/Clinical Decision Making Evolving/Moderate complexity    Rehab Potential Fair    PT Frequency 2x / week    PT Duration 12 weeks    PT Treatment/Interventions ADLs/Self Care Home Management;Gait training;Stair training;Therapeutic activities;Therapeutic exercise;Balance training;Neuromuscular re-education;Patient/family education;Wheelchair mobility training;Manual  techniques;Energy conservation;Dry needling;Passive range of motion;Taping;Spinal Manipulations;Joint Manipulations    PT Next Visit Plan Gait training, Transfer training, LE strengthening as appropriate.    PT Home Exercise Plan HEP: general LE and postural exercises    Consulted and Agree with Plan of Care Patient            Lewis Moccasin PT   06/24/22, 8:30 AM

## 2022-06-28 ENCOUNTER — Ambulatory Visit: Payer: Medicare Other

## 2022-06-28 ENCOUNTER — Encounter: Payer: Self-pay | Admitting: Occupational Therapy

## 2022-06-28 ENCOUNTER — Encounter: Payer: Medicare Other | Admitting: Speech Pathology

## 2022-06-28 ENCOUNTER — Ambulatory Visit: Payer: Medicare Other | Admitting: Occupational Therapy

## 2022-06-28 DIAGNOSIS — M6281 Muscle weakness (generalized): Secondary | ICD-10-CM

## 2022-06-28 DIAGNOSIS — R262 Difficulty in walking, not elsewhere classified: Secondary | ICD-10-CM

## 2022-06-28 DIAGNOSIS — I63512 Cerebral infarction due to unspecified occlusion or stenosis of left middle cerebral artery: Secondary | ICD-10-CM

## 2022-06-28 DIAGNOSIS — R278 Other lack of coordination: Secondary | ICD-10-CM

## 2022-06-28 DIAGNOSIS — R2689 Other abnormalities of gait and mobility: Secondary | ICD-10-CM

## 2022-06-28 DIAGNOSIS — R2681 Unsteadiness on feet: Secondary | ICD-10-CM

## 2022-06-28 DIAGNOSIS — R269 Unspecified abnormalities of gait and mobility: Secondary | ICD-10-CM

## 2022-06-28 NOTE — Therapy (Signed)
OUTPATIENT PHYSICAL THERAPY TREATMENT NOTE/Physical Therapy Progress Note   Dates of reporting period  05/16/2022   to   06/28/2022   Patient Name: Lindsey [name] MRN: [id number] DOB:28-May-1956, 66 y.o., female Today's Date: 06/29/2022  PCP: Dr. Tomasa Hose MD REFERRING PROVIDER: Leeroy Cha MD   PT End of Session - 06/28/22 1057     Visit Number 50    Number of Visits 1    Date for PT Re-Evaluation 07/28/22    Authorization Type Medicare    Authorization Time Period Cert through 04/03/52; Recert 03/08/4402-01/07/4258; Recert 02/05/3874-64/33    Progress Note Due on Visit 50    PT Start Time 1058    PT Stop Time 1145    PT Time Calculation (min) 47 min    Equipment Utilized During Treatment Gait belt    Activity Tolerance No increased pain;Patient tolerated treatment well    Behavior During Therapy [facility] for tasks assessed/performed                     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 No pertinent surgical history. 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 (Lindsey [name]) 09/24/2021   Cerebral embolism with cerebral infarction 09/23/2021   Pyelonephritis 09/19/2021   Pain due to onychomycosis of toenails of both feet 11/19/2020   Normocytic anemia 05/31/2020   Acute ischemic left MCA  stroke (Lindsey [name]) 04/30/2020   Right hemiplegia (Lindsey [name]) 04/30/2020   Cerebrovascular accident ([place]) 04/21/2020   Atrial fibrillation with RVR (Lindsey [name]) 04/21/2020   Essential hypertension 04/21/2020   Alcohol abuse 04/21/2020    REFERRING DIAG: s/p CVA  THERAPY DIAG:  Muscle weakness (generalized)  Acute ischemic left MCA stroke (Lindsey [name])  Other lack of coordination  Unsteadiness on feet  Abnormality of gait and mobility  Difficulty in walking, not elsewhere classified  Other abnormalities of gait and mobility  Rationale for Evaluation and Treatment Rehabilitation  PERTINENT HISTORY: Pt was doing very well and was walking short distances wqith a cane prior to second stroke in december. At this point she is limited to wheelchair as her main means of mobility. Pt caregiver reports all detailed information. Pt has hesitation with the left lower extremity and significant weakness on the right lower extremity. Pt will take 2-3 steps when transfering. Pt caregiver assists with all transfers.Pt has shower with ledge to steps over ledge when entering and utilizes bedside commode. Pt has 2 level home with chair lift for upstairs access. Pt would like to improve ransfers, improve neglect on her R side and improve her overall strength and mobility.Pt. is a 66 y.o. 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 Pt. has Right sided weakness, and receptive, and  expressive aphasia. Pt. has had a previous CVA with right sided weakness in July 2021. PMHx includes: AFib, Renal Artery thrombosis, situational depression, HTN, Pyelnephritis, Seizure activity, COnstipation, AKI. Vitamin D deficiency, and urinary incontinence.   PRECAUTIONS: fall risk, wearing RLE AFO  SUBJECTIVE: Patient reports happy about her efforts with walking last visit and denies any pain or falls.   PAIN:  Are you having pain? No      TODAY'S TREATMENT:   06/23/22    [facility] PT Assessment - 06/28/22 1113       Berg Balance Test   Sit to Stand Needs minimal aid to stand or to stabilize    Standing Unsupported Able to stand 2 minutes with supervision    Sitting with Back Unsupported but Feet Supported on Floor or Stool Able to sit safely and securely 2 minutes    Stand to Sit Controls descent by using hands    Transfers Needs one person to assist    Standing Unsupported with Eyes Closed Able to stand 10 seconds with supervision    Standing Unsupported with Feet Together Needs help to attain position but able to stand for 30 seconds with feet together    From Standing, Reach Forward with Outstretched Arm Reaches forward but needs supervision    From Standing Position, Pick up Object from Floor Unable to try/needs assist to keep balance    From Standing Position, Turn to Look Behind Over each Shoulder Needs supervision when turning    Turn 360 Degrees Needs assistance while turning    Standing Unsupported, Alternately Place Feet on Step/Stool Needs assistance to keep from falling or unable to try    Standing Unsupported, One Foot in Front Needs help to step but can hold 15 seconds    Standing on One Leg Unable to try or needs assist to prevent fall    Total Score 19           INTERVENTIONS:   See goal section for today's reassessment of goals for progress note #50.   Manual therapy:   Manual stretching to right hip ER as patient leg presents externally rotated- Very tight rotating into HIP IR- hold 30 sec x 4 Right hamstring stretching- Seated resting on PT's leg- hold 60 sec with light manual overpressure x 4 sets   Gait training:   Patient ambulated approx 205 feet today with use of Hemi-walker, CGA, right AFO- Utilizing mostly step to gait -sequencing but occasionally reciprocal steps. She presented with 2-3 episodes of unsteadiness with post LOB requiring Mod assist to maintain upright balance but otherwise CGA.   PATIENT  EDUCATION: Education details: Pt educated throughout session about proper posture and technique with exercises. Improved exercise technique, movement at target joints, use of target muscles after min to mod verbal, visual, tactile cues. Continued education on technique with STS.  Person educated: Patient Education method: Explanation, Demonstration, and Verbal cues Education comprehension: verbalized understanding, returned demonstration, and needs further education   HOME EXERCISE PROGRAM: Continue as previously given;    PT Short Term Goals -       PT SHORT TERM GOAL #1   Title Patient will be independent in home exercise program to improve strength/mobility for better functional independence with ADLs.    Baseline no formal HEp for LE/balance at this time; 02/08/2022=Patient continues to require VC for reminders and assist with hip march/knee ext (seated for strengthening); Reminders to continue to work on static stand at home.    Time 4  Period Weeks    Status Partially Met  04/07/22: complete somewhat regularly     Target Date 12/14/21              PT Long Term Goals -       PT LONG TERM GOAL #1   Title Patient will increase FOTO score to equal to or greater than   41  to demonstrate statistically significant improvement in mobility and quality of life.    Baseline 16 on 2/14; 02/08/2022=Patient unable to complete today- No family present but will attempt again next visit available with family present to assist in completion; 02/17/2022= 40; 06/28/2022- No caregiver present in PT session to assist in answering questions.    Time 12    Period Weeks    Status On-going    Target Date 07/28/2022       PT LONG TERM GOAL #3   Title Patient  will complete five times sit to stand test in < 30 seconds with UE assistance indicating an increased LE strength and improved balance.    Baseline 58.47 s with UE assist and with A from PT for R LE positioning; 02/08/2022=54 sec with use of L  UE support and Min physical assist to position right LE.  7/6: 30.5 sec, min A for LE positioning, some attempts did not come to full erect posture, did not let go with L UE from armchair with any attempt 05/05/22: 26.65 sec    Time 12    Period Weeks    Status Met    Target Date 05/03/22      PT LONG TERM GOAL #4   Title Patient will increase Berg Balance score by > 6 points to demonstrate decreased fall risk during functional activities.    Baseline 12 on 2/14; 02/08/2022= 13/56  04/07/22: 17  8/3: 19    Time 12    Period Weeks    Status Met    Target Date 05/03/22      PT LONG TERM GOAL #5   Title Pt will transfer from seated position on table/chair to/from transport chair/WC in order to indicate improved transfer ability.    Baseline Requires Min A for LE positioning and to prevent LOB. 02/08/2022=Patient attempted to perform on her own- Unable to push herself up from chair- Very min assist to position right LE into more flexed position and VC for hand placement. Patient able to stand with min Assist with use of gait belt and VC to lean; 02/17/2022=Patient was able to transfer from transport w/c with some physical assist with Right to swing to pivot to side of chair. She was able to sit to stand today with only CGA and then stand pivot with right arm holding onto arm of chair. Only difficulty was stand to sit- Max VC not to plop into chair. 04/07/22: Pt performed movement of B LE to side of chair independently with chair transfer. Pt required min A with R LE foot placement of floor. Pt requires increased time to complete but complete with CGA only for safety and no cues or A to prevent falling/ plopping into armchair.  8/3: Pt requires min A for maintenance of standig balance, able to navigate from transport chairr to arm chair with hemiwalker, does not controll descent with ahands still " flops" into chair    Time 12    Period Weeks    Status Partially met    Target Date 07/28/2022       PT  LONG  TERM GOAL #6   Title Patient will ambulate > 150 feet with CGA using Hemiwalker on level surfaces for improved household and short community distances.    Baseline 02/08/2022= 40 feet with use of HW, heavy CGA with max VC for gait sequencing and w/c follow. 5/18 = 85 feet with use of HW, close CGA  7/6: 45 feet, heavy use of hemiwalker 8/3: 85 feet with heavy hemi walker use, no Lob throughout, cues for appropriate weight distribution; 06/28/2022 - 205 feet with use of hemiwalker, CGA   Time 12    Period Weeks    Status On-going    Target Date 07/28/2022        PT LONG TERM GOAL #7   Title Pt will increase 10MWT by at least 0.13 m/s in order to demonstrate clinically significant improvement in community ambulation.    Baseline 02/08/2022= 0.05 m/s using HW 7/6: .073ms with heavy ue use of hemiwalker and CGA for balance 8/3: .0540m 06/28/2022= 0.1 m/s (will keep goal active to ensure she is consistent)    Time 12    Period Weeks    Status Ongoing     Target Date 07/28/2022      8.  Pt will improve 5X STS with full knee extension in standing each rep and with appropriate eccentric control in order to indicate improved transfer efficacy and improved LE strength  Baseline: 52.45 sec with UE support; 06/28/2022= 36.03 sec with UE support (VC for erect standing- patient continues to press up using back of legs against chair to stand) Goal status: ONGOING  9.  Patient will increase Berg Balance score by > 6 points to demonstrate decreased fall risk during functional activities Baseline: 19; 06/28/2022= 19/56 Goal status: ONGOING           Plan -     Clinical Impression Statement Patient presents with good motivation for today's visit and agreeable to reassess goals for progress note. Patient did continue to demo improved overall gait distance and speed with only a couple of episodes of LOB increasing her risk of falling. She was also able to demo improvement with her 5x STS significantly  today but continues to push up using the back of her legs to pull herself up. She scored the same with BERG balance as last measured but overall her mobility has improved since last tested. Pt will continue to benefit from skilled physical therapy intervention to address impairments, improve QOL, and attain therapy goals. Patient's condition has the potential to improve in response to therapy. Maximum improvement is yet to be obtained. The anticipated improvement is attainable and reasonable in a generally predictable time.      Personal Factors and Comorbidities Age;Comorbidity 1;Comorbidity 2;Comorbidity 3+;Time since onset of injury/illness/exacerbation;Other    Comorbidities AFib, Renal Artery thrombosis, situational depression, HTN, Pyelnephritis, Seizure activity, COnstipation, AKI    Examination-Activity Limitations Bathing;Bed Mobility;Carry;Locomotion Level;Dressing;Sit;Squat;Stairs;Stand;Toileting;Transfers    Examination-Participation Restrictions Church;Community Activity;Laundry    Stability/Clinical Decision Making Evolving/Moderate complexity    Rehab Potential Fair    PT Frequency 2x / week    PT Duration 12 weeks    PT Treatment/Interventions ADLs/Self Care Home Management;Gait training;Stair training;Therapeutic activities;Therapeutic exercise;Balance training;Neuromuscular re-education;Patient/family education;Wheelchair mobility training;Manual techniques;Energy conservation;Dry needling;Passive range of motion;Taping;Spinal Manipulations;Joint Manipulations    PT Next Visit Plan Gait training, Transfer training, LE strengthening as appropriate.    PT Home Exercise Plan HEP: general LE and postural exercises    Consulted and Agree with Plan of Care Patient  Kathlee Nations Veldon Wager PT   06/29/22, 8:41 AM

## 2022-06-28 NOTE — Therapy (Addendum)
OUTPATIENT OCCUPATIONAL THERAPY TREATMENT/RECERTIFICATION NOTE   Patient Name: Lindsey [name] MRN: [id number] DOB:[birth year], 66 y.o., female Today's Date: 06/28/2022   REFERRING PROVIDER: Karie Fetch, MD   OT End of Session - 06/28/22 1031     Visit Number 58    Number of Visits 96    Date for OT Re-Evaluation 09/20/22    Authorization Type Progress report period starting 02/17/22    OT Start Time 1004    OT Stop Time 1045    OT Time Calculation (min) 41 min    Activity Tolerance Patient tolerated treatment well    Behavior During Therapy WFL for tasks assessed/performed               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 No pertinent surgical history. 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) 09/24/2021   Cerebral embolism with cerebral infarction 09/23/2021   Pyelonephritis 09/19/2021   Pain due to onychomycosis of toenails of both feet 11/19/2020   Normocytic anemia 05/31/2020   Acute ischemic left MCA stroke (HCC) 04/30/2020   Right hemiplegia (HCC) 04/30/2020   Cerebrovascular accident (HCC) 04/21/2020   Atrial fibrillation with RVR (HCC) 04/21/2020   Essential hypertension 04/21/2020   Alcohol abuse 04/21/2020    RUE Measurements (Taken 04/04/22):   PROM Shoulder  flexion:  7/03: 0(108)  5/18: 0(105)  PROM Shoulder abduction: 7/03: 1(740)  5/18: 0(102)  PROM Elbow:   06/28/2022: 0(0-140) 7/03: 0(0-140)  5/18: 0(0-136)  AROM Wrist extension:  06/28/2022: -18/(72) 7/03: -28(72)  5/18: -40(72)  REFERRING DIAG: CVA  THERAPY DIAG:  Muscle weakness (generalized)  Rationale for Evaluation and Treatment Rehabilitation  PERTINENT HISTORY: Pt. is a 66 y.o. 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 Pt. has Right sided weakness, and receptive, and expressive aphasia. Pt. has had a previous CVA with right sided weakness in July 2021. PMHx includes: AFib, Renal Artery thrombosis, situational depression, HTN, Pyelnephritis, Seizure activity, COnstipation, AKI. Vitamin D deficiency, and urinary incontinence/  PRECAUTIONS: Fall   SUBJECTIVE: Pt reports she works on stretching her hand every day   PAIN:  Are you having pain? Yes, Right shoulder with ROM. Faces: 8/10   OBJECTIVE:    Manual Therapy:   Pt. tolerated soft tissue massage to the right scapular musculature. Pt. tolerated scapular mobilizations in elevation, depression, abduction, and rotation following moist heat modality to the right shoulder, wrist, and hand. Pt. tolerated soft tissue mobilizations for radius on ulna, and metacarpal spread stretches to decrease tightness, and prepare the UE, and hand for ROM and therapeutic Ex. Manual therapy was performed independent of, and in preparation for there. Ex. Pt. education was provided about soft tissue massage to  the right forearm.   Therapeutic Exercise:   Pt. tolerated PROM through all joint ranges of the RUE, and hand following moist heat modality, and manual therapy. Pt. worked on reps of active right wrist extension, and right supination with support proximally. Pt. education was provided about opportunities to engage the RUE during daily tasks.    Measurements  were obtained, and goals were reviewed with the Pt. Pt. has improved overall with ADLs since the initial evaluation. Pt. Has progressed with donning a pullover shirt, and using one armed dressing techniques for donning a button down shirt. Pt. is consistently able to thread her right hand, and forearm through the sleeve. Pt. now requires less assist to thread the right sleeve around her elbow, and pull the sleeve up from the elbow to the shoulder. Pt. has improved with efficiently reaching around the back of her neck for the shirt collar, and donn her left shoulder through. Pt. has difficulty donning LE clothing, and brace over the RLE. Pt. continues to require increased verbal, and tactile cues to perform self-ROM to the right UE. Pt. Presents with pain in the right shoulder during ROM today. Pt. Has improved with right wrist extension. Pt. continues to work on normalizing tone, and facilitating active volitional movement in the RUE, and hand to improve engagement of the RUE during ADLs, as well as improve transfers, and functional mobility for toileting.         PATIENT EDUCATION: Education details: dressing techniques, HEP Person educated: Patient Education method: Medical illustrator Education comprehension: verbalized understanding, returned demonstration, verbal cues required, and tactile cues required   HOME EXERCISE PROGRAM   Continue with ongoing HEPs for ROM to the RUE, hand, and digits.    OT Short Term Goals -       OT SHORT TERM GOAL #1   Title Pt and caregiver will be independent with HEP for RUE    Baseline Eval: No current program. 10th visit: Caregivers  report being comfortable with exercises at home. 01/06/2022: Continue ongoing HEPs for UE. 30th visit: Pt./caregivers to continue with ROM; 40th: pt/caregivers to continue with PROM throughout RUE 06/28/2022: Pt. Reports consistency has been limited.   Time 6   Period Weeks    Status On-going    Target Date 08/09/2022                OT Long Term Goals -       OT LONG TERM GOAL #1   Title Pt. will engage the RUE as a gross assist/atabilizer during ADLs 100% of the time    Baseline 06/28/2022: Pt. Continues to try to engage her RUE as a gross stabilizer. Continues to stabilize the loofa. 04/04/2022: Pt. Is using her right hand to hold the loofa, and stabilize items.30th visit: Pt. is initiating using her RUE as a gross stabilizer with maxA 01/06/2022: Pt. is starting to engage her RUE as a gross stabiliizer with increased cues. Eval: pt. does not currently engage her RUE during ADLs. 10th visit: Pt. continues to need to work towards using her RUE as a gross stabilizer.    Time 12    Status On-going    Target Date 09/20/22      OT LONG TERM GOAL #2   Title Pt. will perform UE dressing using one armed dressing techniques    Baseline Eval: MaxA, 10th visit: ModA 01/06/2022: min-modA 30th visit; min0-modA, 04/04/2022: minA donning loose shirts, MaxA tighter items/sports bra, independent doffing a shirt and bra  06/28/2022: minA donning, and doffing loose T-shirts.    Time 12    Period Weeks    Status On-going    Target Date 09/20/22      OT LONG TERM GOAL #3   Title Pt. will require minA to stand and hike pants    Baseline Eval: MaxA, 10th visit: [place] 01/06/2022: [place] 30th visit: ModA 04/04/2022: independent with elastic pants if above the knees. MaxA if they fall below the knee; 04/07/22: min guard-min A to reach below knees to simulate hiking pants. 06/28/2022: Min-CGA reaching to hike pants below the knees.   Time 12    Period Weeks    Status On-going    Target Date 09/20/22      OT LONG TERM GOAL #4   Title Pt. will perform LE dressing with minA    Baseline Eval: MaxA 10th visit: Pt. requires MaxA for donning her brace, shoes, and socks. ModA with pants. 01/06/2022: ModA pants, MaxA shoes, socks, and brace 30th visit: ModA pants, MaxA shoes, socks, and brace. 04/04/2022: MaxA; 04/07/22: pt can don/doff L sock and  shoe with supv, dep for RLE; see above for hiking pants 06/28/2022: Supervision donning left shoe, and sock, Dep. For RLE.   Time 12    Period Weeks    Status On-going    Target Date 09/20/22      OT LONG TERM GOAL #5   Title Pt. will improve FOTO score by 2 pt. to reflect funcational change    Baseline Eval: FOTO score: 12, 10th visit: FOTO score 27 01/06/2022: 27 30th visit TBA at next visit secondary to no family available during the session; 04/04/22: FOTO 40   Time 12    Period Weeks    Status On-going    Target Date 09/20/2022/23      OT LONG TERM GOAL #6   Title Patient will transfer to bedside commode or toilet with supervision.    Baseline 30th visit: min-modA. 01/06/2022: min-ModA 04/04/2022: Min-ModA 06/28/2022: min-ModA   Time 4    Period Weeks    Status On-going    Target Date 09/20/22        OT LONG TERM GOAL #7                                                      Title: Pt. Will improve AROM wrist extension by 5 degrees in preparation for improving functional  reaching.                                                                                  Baseline:       7/03: -28(72) 06/28/2022: -18(72)            Time: 4           Period: Weeks           Status: Ongoing           Target Date: 09/20/2022   Plan - 03/24/22 1429     Clinical Impression Statement  Measurements were obtained, and goals were reviewed with the Pt. Pt. has improved overall with ADLs since the initial evaluation. Pt. Has progressed with donning a pullover shirt, and using one armed dressing techniques for donning a button down shirt. Pt. is consistently able to thread her right hand, and forearm through the sleeve. Pt. now requires less assist to thread the right sleeve around her elbow, and pull the sleeve up from the elbow to the shoulder. Pt. has improved with efficiently reaching around the back of her neck for the shirt collar, and donn her left shoulder through. Pt. has difficulty donning LE clothing,  and brace over the RLE. Pt. continues to require increased verbal, and tactile cues to perform self-ROM to the right UE. Pt. Presents with pain in the right shoulder during ROM today. Pt. Has improved with right wrist extension. Pt. continues to work on normalizing tone, and facilitating active volitional movement in the RUE, and hand to improve engagement of the RUE during ADLs, as well as improve transfers, and functional mobility for toileting.                    OT Occupational Profile and History Detailed Assessment- Review of Records and additional review of physical, cognitive, psychosocial history related to current functional performance    Occupational performance deficits (Please refer to evaluation for details): Leisure;IADL's;ADL's    Body Structure / Function / Physical Skills ADL;IADL;FMC;ROM;UE functional use;Strength;Decreased knowledge of use of DME;Coordination    Rehab Potential Fair    Clinical Decision Making Several treatment options, min-mod task modification necessary    Comorbidities Affecting Occupational Performance: May have comorbidities impacting occupational performance    Modification or Assistance to Complete Evaluation  Min-Moderate modification of tasks or assist with assess necessary to complete eval    OT Frequency 2x / week    OT Duration 12 weeks    OT Treatment/Interventions Self-care/ADL training;Neuromuscular education;Electrical Stimulation;Patient/family education;Therapeutic activities;Passive range of motion;Building services engineer;Therapeutic exercise;DME and/or AE instruction;Moist Heat    Consulted and Agree with Plan of Care Patient             Olegario Messier, MS, OTR/L  Olegario Messier, OT 06/28/2022, 10:33 AM

## 2022-06-30 ENCOUNTER — Encounter: Payer: Self-pay | Admitting: Physical Medicine and Rehabilitation

## 2022-06-30 ENCOUNTER — Encounter: Payer: Medicare Other | Admitting: Speech Pathology

## 2022-06-30 ENCOUNTER — Encounter
Payer: Medicare Other | Attending: Physical Medicine and Rehabilitation | Admitting: Physical Medicine and Rehabilitation

## 2022-06-30 ENCOUNTER — Ambulatory Visit: Payer: Medicare Other

## 2022-06-30 ENCOUNTER — Ambulatory Visit: Payer: Medicare Other | Admitting: Occupational Therapy

## 2022-06-30 VITALS — BP 139/79 | HR 66 | Ht 67.0 in

## 2022-06-30 DIAGNOSIS — M6281 Muscle weakness (generalized): Secondary | ICD-10-CM | POA: Diagnosis not present

## 2022-06-30 DIAGNOSIS — R278 Other lack of coordination: Secondary | ICD-10-CM

## 2022-06-30 DIAGNOSIS — R252 Cramp and spasm: Secondary | ICD-10-CM

## 2022-06-30 DIAGNOSIS — I69351 Hemiplegia and hemiparesis following cerebral infarction affecting right dominant side: Secondary | ICD-10-CM | POA: Diagnosis present

## 2022-06-30 DIAGNOSIS — R2681 Unsteadiness on feet: Secondary | ICD-10-CM

## 2022-06-30 DIAGNOSIS — G8191 Hemiplegia, unspecified affecting right dominant side: Secondary | ICD-10-CM | POA: Diagnosis present

## 2022-06-30 DIAGNOSIS — I63542 Cerebral infarction due to unspecified occlusion or stenosis of left cerebellar artery: Secondary | ICD-10-CM

## 2022-06-30 DIAGNOSIS — R262 Difficulty in walking, not elsewhere classified: Secondary | ICD-10-CM

## 2022-06-30 DIAGNOSIS — R269 Unspecified abnormalities of gait and mobility: Secondary | ICD-10-CM

## 2022-06-30 DIAGNOSIS — I63512 Cerebral infarction due to unspecified occlusion or stenosis of left middle cerebral artery: Secondary | ICD-10-CM

## 2022-06-30 DIAGNOSIS — I69398 Other sequelae of cerebral infarction: Secondary | ICD-10-CM | POA: Insufficient documentation

## 2022-06-30 DIAGNOSIS — R2689 Other abnormalities of gait and mobility: Secondary | ICD-10-CM

## 2022-06-30 MED ORDER — ONABOTULINUMTOXINA 100 UNITS IJ SOLR
300.0000 [IU] | Freq: Once | INTRAMUSCULAR | Status: AC
  Administered 2022-06-30: 300 [IU] via INTRAMUSCULAR

## 2022-06-30 NOTE — Therapy (Signed)
OUTPATIENT PHYSICAL THERAPY TREATMENT NOTE  Patient Name: Lindsey [name] MRN: [id number] DOB:[birth year], 66 y.o., female Today's Date: 07/01/2022  PCP: Dr. Tomasa Hose MD REFERRING PROVIDER: Leeroy Cha MD   PT End of Session - 06/30/22 1105     Visit Number 51    Number of Visits 44    Date for PT Re-Evaluation 07/28/22    Authorization Type Medicare    Authorization Time Period Cert through 0/8/14; Recert 01/09/1855-12/01/4968; Recert 11/09/3783-88/50    Progress Note Due on Visit 50    PT Start Time 1100    PT Stop Time 1141    PT Time Calculation (min) 41 min    Equipment Utilized During Treatment Gait belt    Activity Tolerance No increased pain;Patient tolerated treatment well    Behavior During Therapy [facility] for tasks assessed/performed                     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 No pertinent surgical history. 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 At this point she is limited to wheelchair as her main means of mobility. Pt caregiver reports all detailed information. Pt has hesitation with the left lower extremity and significant weakness on the right lower extremity. Pt will take 2-3 steps when transfering. Pt caregiver assists with all transfers.Pt has shower with ledge to steps over ledge when entering and utilizes bedside commode. Pt has 2 level home with chair lift for upstairs access. Pt would like to improve ransfers, improve neglect on her R side and improve her overall strength and mobility.Pt. is a 66 y.o. 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 Pt. has Right sided weakness, and receptive, and expressive aphasia. Pt. has  had a previous CVA with right sided weakness in July 2021. PMHx includes: AFib, Renal Artery thrombosis, situational depression, HTN, Pyelnephritis, Seizure activity, COnstipation, AKI. Vitamin D deficiency, and urinary incontinence.   PRECAUTIONS: fall risk, wearing RLE AFO  SUBJECTIVE: Patient reports just coming from appointment where she received botox injection. States sore arm today after procedure.    PAIN:  Are you having pain?  No      TODAY'S TREATMENT:  06/30/22    INTERVENTIONS:   Manual therapy:   Manual stretching to right hip ER as patient leg presents externally rotated- Very tight rotating into HIP IR- hold 30 sec x 4 Right hamstring stretching- Seated resting on PT's leg- hold 60 sec with light manual overpressure x 4 sets   Therex:   Seated Hamstring curls with GTB 2 sets of 15 reps Seated knee ext (right Leg resting on top of Author's knee) -2 sets of 12 (attempting as full of ROM as possible)  Seated Hip march (AAROM on right LE) and Resistive - GTB on left LE Seated hip flex/abd/add up over yoga block x 10 reps BLE  Sit to stand x 15 reps - focusing on using left UE support and leaning forward.  PATIENT EDUCATION: Education details: Pt educated throughout session about proper posture and technique with exercises. Improved exercise technique, movement at target joints, use of target muscles after min to mod verbal, visual, tactile cues. Continued education on technique with STS.  Person educated: Patient Education method: Explanation, Demonstration, and Verbal cues Education comprehension: verbalized understanding, returned demonstration, and needs further education   HOME EXERCISE PROGRAM: Continue as previously given;    PT Short Term Goals -       PT SHORT TERM GOAL #1   Title Patient will be independent in home exercise program to improve strength/mobility for better functional independence with ADLs.    Baseline no formal HEp for LE/balance at this time; 02/08/2022=Patient continues to require VC for reminders and assist with hip march/knee ext (seated for strengthening); Reminders to continue to work on static stand at home.    Time 4    Period Weeks    Status Partially Met  04/07/22: complete somewhat regularly     Target Date 12/14/21              PT Long Term Goals -       PT LONG TERM GOAL #1   Title Patient will increase FOTO score to equal to or greater than    41  to demonstrate statistically significant improvement in mobility and quality of life.    Baseline 16 on 2/14; 02/08/2022=Patient unable to complete today- No family present but will attempt again next visit available with family present to assist in completion; 02/17/2022= 40; 06/28/2022- No caregiver present in PT session to assist in answering questions.    Time 12    Period Weeks    Status On-going    Target Date 07/28/2022       PT LONG TERM GOAL #3   Title Patient  will complete five times sit to stand test in < 30 seconds with UE assistance indicating an increased LE strength and improved balance.    Baseline 58.47 s with UE assist and with A from PT for R LE positioning; 02/08/2022=54 sec with use of L UE support and Min physical assist to position right LE.  7/6: 30.5 sec, min A for LE positioning, some attempts did not come to full erect posture, did  not let go with L UE from armchair with any attempt 05/05/22: 26.65 sec    Time 12    Period Weeks    Status Met    Target Date 05/03/22      PT LONG TERM GOAL #4   Title Patient will increase Berg Balance score by > 6 points to demonstrate decreased fall risk during functional activities.    Baseline 12 on 2/14; 02/08/2022= 13/56  04/07/22: 17  8/3: 19    Time 12    Period Weeks    Status Met    Target Date 05/03/22      PT LONG TERM GOAL #5   Title Pt will transfer from seated position on table/chair to/from transport chair/WC in order to indicate improved transfer ability.    Baseline Requires Min A for LE positioning and to prevent LOB. 02/08/2022=Patient attempted to perform on her own- Unable to push herself up from chair- Very min assist to position right LE into more flexed position and VC for hand placement. Patient able to stand with min Assist with use of gait belt and VC to lean; 02/17/2022=Patient was able to transfer from transport w/c with some physical assist with Right to swing to pivot to side of chair. She was able to sit  to stand today with only CGA and then stand pivot with right arm holding onto arm of chair. Only difficulty was stand to sit- Max VC not to plop into chair. 04/07/22: Pt performed movement of B LE to side of chair independently with chair transfer. Pt required min A with R LE foot placement of floor. Pt requires increased time to complete but complete with CGA only for safety and no cues or A to prevent falling/ plopping into armchair.  8/3: Pt requires min A for maintenance of standig balance, able to navigate from transport chairr to arm chair with hemiwalker, does not controll descent with ahands still " flops" into chair    Time 12    Period Weeks    Status Partially met    Target Date 07/28/2022       PT LONG TERM GOAL #6   Title Patient will ambulate > 150 feet with CGA using Hemiwalker on level surfaces for improved household and short community distances.    Baseline 02/08/2022= 40 feet with use of HW, heavy CGA with max VC for gait sequencing and w/c follow. 5/18 = 85 feet with use of HW, close CGA  7/6: 45 feet, heavy use of hemiwalker 8/3: 85 feet with heavy hemi walker use, no Lob throughout, cues for appropriate weight distribution; 06/28/2022 - 205 feet with use of hemiwalker, CGA   Time 12    Period Weeks    Status On-going    Target Date 07/28/2022        PT LONG TERM GOAL #7   Title Pt will increase 10MWT by at least 0.13 m/s in order to demonstrate clinically significant improvement in community ambulation.    Baseline 02/08/2022= 0.05 m/s using HW 7/6: .046ms with heavy ue use of hemiwalker and CGA for balance 8/3: .0511m 06/28/2022= 0.1 m/s (will keep goal active to ensure she is consistent)    Time 12    Period Weeks    Status Ongoing     Target Date 07/28/2022      8.  Pt will improve 5X STS with full knee extension in standing each rep and with appropriate eccentric control in order to indicate improved transfer efficacy and  improved LE strength  Baseline: 52.45 sec  with UE support; 06/28/2022= 36.03 sec with UE support (VC for erect standing- patient continues to press up using back of legs against chair to stand) Goal status: ONGOING  9.  Patient will increase Berg Balance score by > 6 points to demonstrate decreased fall risk during functional activities Baseline: 19; 06/28/2022= 19/56 Goal status: ONGOING           Plan -     Clinical Impression Statement Patient presented with soreness in right arm/hand after receiving botox so treatment focused on flexibility and strength of right LE today. She performed well overall able to improve with increased knee flex and more muscle activation with specific VC and TC to keep her engaged. She was to sit to stand better today overall as well.  Pt will continue to benefit from skilled physical therapy intervention to address impairments, improve QOL, and attain therapy goals.    Personal Factors and Comorbidities Age;Comorbidity 1;Comorbidity 2;Comorbidity 3+;Time since onset of injury/illness/exacerbation;Other    Comorbidities AFib, Renal Artery thrombosis, situational depression, HTN, Pyelnephritis, Seizure activity, COnstipation, AKI    Examination-Activity Limitations Bathing;Bed Mobility;Carry;Locomotion Level;Dressing;Sit;Squat;Stairs;Stand;Toileting;Transfers    Examination-Participation Restrictions Church;Community Activity;Laundry    Stability/Clinical Decision Making Evolving/Moderate complexity    Rehab Potential Fair    PT Frequency 2x / week    PT Duration 12 weeks    PT Treatment/Interventions ADLs/Self Care Home Management;Gait training;Stair training;Therapeutic activities;Therapeutic exercise;Balance training;Neuromuscular re-education;Patient/family education;Wheelchair mobility training;Manual techniques;Energy conservation;Dry needling;Passive range of motion;Taping;Spinal Manipulations;Joint Manipulations    PT Next Visit Plan Gait training, Transfer training, LE strengthening as  appropriate.    PT Home Exercise Plan HEP: general LE and postural exercises    Consulted and Agree with Plan of Care Patient            Lewis Moccasin PT   07/01/22, 12:11 PM

## 2022-06-30 NOTE — Progress Notes (Signed)
Botox Injection for spasticity, palpation and activation approach used for localization since ultrasound machine was not working   Dilution: 1:1 Indication: Severe spasticity which interferes with ADL,mobility and/or  hygiene and is unresponsive to medication management and other conservative care   Informed consent was obtained after describing risks and benefits of the procedure with the patient. This includes bleeding, bruising, infection, excessive weakness, or medication side effects. A REMS form is on file and signed.  1) Pectoralis: 50U 2) Flexor digitorum profundus: 50U in 1 site 3) Flexor carpi radialis: 50U U in 1 site 4) Flexor digitorum superficialis: 50U in 1 site 5) Pronator quadratus: 50U 6) Pronator teres: 50U     All injections were done after palpation and patient activation of the muscle confirmed the accurate location, and after negative drawback for blood. The patient tolerated the procedure well. Post procedure instructions were given. A followup appointment was made.  

## 2022-07-05 ENCOUNTER — Ambulatory Visit: Payer: Medicare Other | Admitting: Occupational Therapy

## 2022-07-05 ENCOUNTER — Encounter: Payer: Medicare Other | Admitting: Speech Pathology

## 2022-07-05 ENCOUNTER — Ambulatory Visit: Payer: Medicare Other

## 2022-07-06 ENCOUNTER — Ambulatory Visit: Payer: Medicare Other | Admitting: Occupational Therapy

## 2022-07-07 ENCOUNTER — Encounter: Payer: Medicare Other | Admitting: Speech Pathology

## 2022-07-07 ENCOUNTER — Ambulatory Visit: Payer: Medicare Other | Attending: Physical Medicine and Rehabilitation | Admitting: Occupational Therapy

## 2022-07-07 ENCOUNTER — Ambulatory Visit: Payer: Medicare Other

## 2022-07-07 DIAGNOSIS — R2681 Unsteadiness on feet: Secondary | ICD-10-CM | POA: Insufficient documentation

## 2022-07-07 DIAGNOSIS — I63512 Cerebral infarction due to unspecified occlusion or stenosis of left middle cerebral artery: Secondary | ICD-10-CM | POA: Diagnosis present

## 2022-07-07 DIAGNOSIS — R269 Unspecified abnormalities of gait and mobility: Secondary | ICD-10-CM | POA: Diagnosis present

## 2022-07-07 DIAGNOSIS — R2689 Other abnormalities of gait and mobility: Secondary | ICD-10-CM

## 2022-07-07 DIAGNOSIS — R278 Other lack of coordination: Secondary | ICD-10-CM

## 2022-07-07 DIAGNOSIS — M6281 Muscle weakness (generalized): Secondary | ICD-10-CM

## 2022-07-07 DIAGNOSIS — R262 Difficulty in walking, not elsewhere classified: Secondary | ICD-10-CM | POA: Insufficient documentation

## 2022-07-07 DIAGNOSIS — I63542 Cerebral infarction due to unspecified occlusion or stenosis of left cerebellar artery: Secondary | ICD-10-CM | POA: Diagnosis present

## 2022-07-07 NOTE — Therapy (Addendum)
OUTPATIENT OCCUPATIONAL THERAPY TREATMENT NOTE   Patient Name: Lindsey [name] MRN: [id number] DOB:[birth year], 66 y.o., female Today's Date: 07/07/2022   REFERRING PROVIDER: Tomasa Hose, MD   OT End of Session - 07/07/22 1011     Visit Number 56    Number of Visits 12    Date for OT Re-Evaluation 09/20/22    Authorization Type Progress report period starting 02/17/22    OT Start Time 1005    OT Stop Time 1045    OT Time Calculation (min) 40 min    Activity Tolerance Patient tolerated treatment well    Behavior During Therapy WFL for tasks assessed/performed               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) 04/21/2020   Atrial fibrillation with RVR ([place]) 04/21/2020   Essential hypertension 04/21/2020   Alcohol abuse 04/21/2020    RUE Measurements (Taken 04/04/22):   PROM Shoulder flexion:  7/03: 0(108)  5/18:  0(105)  PROM Shoulder abduction: 7/03: [id number])  5/18: 0(102)  PROM Elbow:   06/28/2022: 0(0-140) 7/03: 0(0-140)  5/18: 0(0-136)  AROM Wrist extension:  06/28/2022: -18/(72) 7/03: -28(72)  5/18: -40(72)  REFERRING DIAG: CVA  THERAPY DIAG:  Muscle weakness (generalized)  Rationale for Evaluation and Treatment Rehabilitation  PERTINENT HISTORY: Pt. is a 66 y.o. 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 Pt. has Right sided weakness, and receptive, and expressive aphasia. Pt. has had a previous CVA with right sided weakness in July 2021. PMHx includes: AFib, Renal Artery thrombosis, situational depression, HTN, Pyelnephritis, Seizure activity, COnstipation, AKI. Vitamin D deficiency, and urinary incontinence/  PRECAUTIONS: Fall   SUBJECTIVE: Pt reports she works on stretching her hand every day   PAIN:  Are you having pain? No   OBJECTIVE:    Manual Therapy:   Pt. tolerated soft tissue massage to the right scapular musculature. Pt. tolerated scapular mobilizations in elevation, depression, abduction, and rotation following moist heat modality to the right shoulder, wrist, and hand. Pt. tolerated soft tissue mobilizations for radius on ulna, and metacarpal spread stretches to decrease tightness, and prepare the UE, and hand for ROM and therapeutic Ex. Manual therapy was performed independent of, and in preparation for there. Ex. Pt. education was provided about soft tissue massage to the right forearm.   Therapeutic  Exercise:   Pt. tolerated PROM through all joint ranges of the RUE, and hand following moist heat modality, and manual therapy. Pt. worked on reps of active right wrist extension, and right supination with support proximally. Pt. education was provided about opportunities to engage the RUE during daily tasks.    Pt. Tolerated ther. Ex, and manual therapy well today however requires increased  assist for self-ROM. Pt. continues to require increased verbal, and tactile cues for self-ROM technique to the right UE. Pt. Presents with pain in the right shoulder during ROM today. Pt. continues to work on normalizing tone, and facilitating active volitional movement in the RUE, and hand to improve engagement of the RUE during ADLs, as well as improve transfers, and functional mobility for toileting.            PATIENT EDUCATION: Education details: dressing techniques, HEP Person educated: Patient Education method: Customer service manager Education comprehension: verbalized understanding, returned demonstration, verbal cues required, and tactile cues required   [place] with ongoing HEPs for ROM to the RUE, hand, and digits.   OT SHORT TERM GOAL #1     Title Pt and caregiver will be independent with HEP for RUE     Baseline Eval: No current program. 10th visit: Caregivers  report being comfortable with exercises at home. 01/06/2022: Continue ongoing HEPs for UE. 30th visit: Pt./caregivers to continue with ROM; 40th: pt/caregivers to continue with PROM throughout RUE 05/16/2022:  Continue ongoing pt./caregiver education about HEPs for the RUE.06/28/2022: Pt. Reports consistency has been limited.    Time 6    Period Weeks     Status On-going     Target Date 08/09/2022                    OT Long Term Goals -                OT LONG TERM GOAL #1    Title Pt. will engage the RUE as a gross assist/atabilizer during ADLs 100% of the time     Baseline 06/28/2022: Pt. Continues to try to engage her RUE as a gross stabilizer. Continues to stabilize the loofa 05/16/2022: Pt. Is able to place items, in her right hand, and use her right hand  to stabilize various sizes of bottles while using her left hand to open them. 04/04/2022: Pt. Is using her right hand to hold the loofa, and stabilize items. 30th visit: Pt. is initiating using her RUE as a gross stabilizer with  maxA 01/06/2022: Pt. is starting to engage her RUE as a gross stabiliizer with increased cues. Eval: pt. does not currently engage her RUE during ADLs. 10th visit: Pt. continues to need to work towards using her RUE as a gross stabilizer.     Time 12     Status On-going     Target Date 06/27/22          OT LONG TERM GOAL #2    Title Pt. will perform UE dressing using one armed dressing techniques     Baseline Eval: MaxA, 10th visit: [place] 01/06/2022: min-modA 30th visit; min0-modA, 04/04/2022: minA donning loose shirts, MaxA tighter items/sports bra, independent doffing a shirt and bra 05/16/2022: minA donning loose shirts, MaxA tighter items/sports bra, independent doffing a shirt and bra. 06/28/2022: minA donning, and doffing loose T-shirts.     Time 12     Period Weeks     Status On-going  Target Date 09/20/22          OT LONG TERM GOAL #3    Title Pt. will require minA to stand and hike pants     Baseline Eval: MaxA, 10th visit: [place] 01/06/2022: [place] 30th visit: ModA 04/04/2022: independent with elastic pants if above the knees. MaxA if they fall below the knee; 04/07/22: min guard-min A to reach below knees to simulate hiking pants 05/16/2022: Min guard-MinA reaching below the knee to hike pants.06/28/2022: Min-CGA reaching to hike pants below the knees.    Time 12     Period Weeks     Status On-going     Target Date 09/20/22          OT LONG TERM GOAL #4    Title Pt. will perform LE dressing with minA     Baseline Eval: MaxA 10th visit: Pt. requires MaxA for donning her brace, shoes, and socks. ModA with pants. 01/06/2022: ModA pants, MaxA shoes, socks, and brace 30th visit: ModA pants, MaxA shoes, socks, and brace. 04/04/2022: MaxA; 04/07/22: pt can don/doff L sock and shoe with supv, dep for RLE; see above for hiking pants 05/16/2022:  Donning and doffing the Left sock and shoe with supervision. Total Assist for the RLE.06/28/2022: Supervision donning left shoe, and sock, Dep. For RLE.    Time 12      Period Weeks     Status On-going     Target Date 09/20/22          OT LONG TERM GOAL #5    Title Pt. will improve FOTO score by 2 pt. to reflect funcational change     Baseline Eval: FOTO score: 12, 10th visit: FOTO score 27 01/06/2022: 27 30th visit TBA at next visit secondary to no family available during the session; 04/04/22: FOTO 40 05/16/2022: 27    Time 12     Period Weeks     Status On-going     Target Date 09/20/22          OT LONG TERM GOAL #6    Title Patient will transfer to bedside commode or toilet with supervision.     Baseline 30th visit: min-modA. 01/06/2022: min-ModA 04/04/2022: Min-ModA 05/16/2022: min-modA 06/28/2022: min-ModA    Time 4     Period Weeks     Status On-going     Target Date 09/20/22         OT LONG TERM GOAL #7                                                      Title: Pt. Will improve AROM wrist extension by 5 degrees in preparation for improving functional  reaching.                                                                                  Baseline:       7/03: -28(72) 06/28/2022: -18(72)            Time: 4  Period: Weeks           Status: Ongoing           Target Date: 09/20/2022   Plan - 03/24/22 1429     Clinical Impression Statement Pt. Tolerated ther. Ex, and manual therapy well today however requires increased assist for self-ROM. Pt. continues to require increased verbal, and tactile cues for self-ROM technique to the right UE. Pt. Presents with pain in the right shoulder during ROM today. Pt. continues to work on normalizing tone, and facilitating active volitional movement in the RUE, and hand to improve engagement of the RUE during ADLs, as well as improve transfers, and functional mobility for toileting.                   OT Occupational Profile and History Detailed Assessment- Review of Records and additional review of physical, cognitive, psychosocial history related to current functional performance    Occupational  performance deficits (Please refer to evaluation for details): Leisure;IADL's;ADL's    Body Structure / Function / Physical Skills ADL;IADL;FMC;ROM;UE functional use;Strength;Decreased knowledge of use of DME;Coordination    Rehab Potential Fair    Clinical Decision Making Several treatment options, min-mod task modification necessary    Comorbidities Affecting Occupational Performance: May have comorbidities impacting occupational performance    Modification or Assistance to Complete Evaluation  Min-Moderate modification of tasks or assist with assess necessary to complete eval    OT Frequency 2x / week    OT Duration 12 weeks    OT Treatment/Interventions Self-care/ADL training;Neuromuscular education;Electrical Stimulation;Patient/family education;Therapeutic activities;Passive range of motion;Therapist, nutritional;Therapeutic exercise;DME and/or AE instruction;Moist Heat    Consulted and Agree with Plan of Care Patient             Harrel Carina, MS, OTR/L  Harrel Carina, OT 07/07/2022, 10:14 AM

## 2022-07-07 NOTE — Therapy (Signed)
OUTPATIENT PHYSICAL THERAPY TREATMENT NOTE  Patient Name: Lindsey [name] MRN: [id number] DOB:August 22, 1956, 66 y.o., female Today's Date: 07/07/2022  PCP: Dr. Tomasa Hose MD REFERRING PROVIDER: Leeroy Cha MD   PT End of Session - 07/07/22 1051     Visit Number 52    Number of Visits 35    Date for PT Re-Evaluation 07/28/22    Authorization Type Medicare    Authorization Time Period Cert through 03/08/05; Recert 3/0/1601-0/06/3234; Recert 02/07/3219-25/42    Progress Note Due on Visit 60    PT Start Time 1048    PT Stop Time 1130    PT Time Calculation (min) 42 min    Equipment Utilized During Treatment Gait belt    Activity Tolerance No increased pain;Patient tolerated treatment well    Behavior During Therapy [facility] for tasks assessed/performed                     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 No pertinent surgical history. 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 ([place]) 09/24/2021   Cerebral embolism with cerebral infarction 09/23/2021   Pyelonephritis 09/19/2021   Pain due to onychomycosis of toenails of both feet 11/19/2020   Normocytic anemia 05/31/2020   Acute ischemic left MCA stroke ([place]) 04/30/2020   Right hemiplegia ([place]) 04/30/2020   Cerebrovascular accident  ([place]) 04/21/2020   Atrial fibrillation with RVR ([place]) 04/21/2020   Essential hypertension 04/21/2020   Alcohol abuse 04/21/2020    REFERRING DIAG: s/p CVA  THERAPY DIAG:  Muscle weakness (generalized)  Acute ischemic left MCA stroke (HCC)  Other lack of coordination  Unsteadiness on feet  Abnormality of gait and mobility  Difficulty in walking, not elsewhere classified  Other abnormalities of gait and mobility  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 At this point she is limited to wheelchair as her main means of mobility. Pt caregiver reports all detailed information. Pt has hesitation with the left lower extremity and significant weakness on the right lower extremity. Pt will take 2-3 steps when transfering. Pt caregiver assists with all transfers.Pt has shower with ledge to steps over ledge when entering and utilizes bedside commode. Pt has 2 level home with chair lift for upstairs access. Pt would like to improve ransfers, improve neglect on her R side and improve her overall strength and mobility.Pt. is a 66 y.o. 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 Pt. has Right sided weakness, and receptive, and expressive aphasia. Pt. has  had a previous CVA with right sided weakness in July 2021. PMHx includes: AFib, Renal Artery thrombosis, situational depression, HTN, Pyelnephritis, Seizure activity, COnstipation, AKI. Vitamin D deficiency, and urinary incontinence.   PRECAUTIONS: fall risk, wearing RLE AFO  SUBJECTIVE: Patient reports doing well- Voices no new changes. States she wonders if her right LE will ever be able to bend well.    PAIN:  Are you having pain?  No      TODAY'S TREATMENT:  07/07/22    INTERVENTIONS:   Manual therapy:   Manual stretching to right hamstring hold 30 sec  x 4 sets  PROM to right knee - working on progressing into increased right knee flex - approx 105 deg (Patient reported very tight)   Therex:   Seated Hamstring curls with GTB 2 sets of 15 reps Seated knee ext  -2 sets of 12 (attempting as full of ROM as possible on right and 3# AW on left)  Seated Hip march (AROM on right LE) and Resistive - 3# AW on left LE Seated hip flex/abd/add up over hedgeghog x 10 reps BLE  Active right hamstring curl- trying to bend right knee back as much as possible.  Gait training: Patient ambulated approx 80 feet and later 120 feet using hemiwalker, CGA with close w/c follow on 2nd trial- Patient with mostly step to gait with some difficulty with right LE placement yet overall much improved quality and cadence today.   PATIENT EDUCATION: Education details: Pt educated throughout session about proper posture and technique with exercises. Improved exercise technique, movement at target joints, use of target muscles after min to mod verbal, visual, tactile cues. Continued education on technique with STS.  Person educated: Patient Education method: Explanation, Demonstration, and Verbal cues Education comprehension: verbalized understanding, returned demonstration, and needs further education   HOME EXERCISE PROGRAM: Continue as previously given;    PT Short Term Goals -       PT SHORT TERM GOAL #1   Title Patient will be independent in home exercise program to improve strength/mobility for better functional independence with ADLs.    Baseline no formal HEp for LE/balance at this time; 02/08/2022=Patient continues to require VC for reminders and assist with hip march/knee ext (seated for strengthening); Reminders to continue to work on static stand at home.    Time 4    Period Weeks    Status Partially Met  04/07/22: complete  somewhat regularly     Target Date 12/14/21              PT Long Term Goals -       PT LONG TERM GOAL #1   Title Patient will increase FOTO score to equal to or greater than   41  to demonstrate statistically significant improvement in mobility and quality of life.    Baseline 16 on 2/14; 02/08/2022=Patient unable to complete today- No family present but will attempt again next visit available with family present to assist in completion; 02/17/2022= 40; 06/28/2022- No caregiver present in PT session to assist in answering questions.    Time 12    Period Weeks    Status On-going    Target Date 07/28/2022       PT LONG TERM GOAL #3   Title Patient  will complete five times sit to stand test in < 30 seconds with UE assistance indicating an increased LE strength and improved balance.    Baseline 58.47 s with UE assist and with A from  PT for R LE positioning; 02/08/2022=54 sec with use of L UE support and Min physical assist to position right LE.  7/6: 30.5 sec, min A for LE positioning, some attempts did not come to full erect posture, did not let go with L UE from armchair with any attempt 05/05/22: 26.65 sec    Time 12    Period Weeks    Status Met    Target Date 05/03/22      PT LONG TERM GOAL #4   Title Patient will increase Berg Balance score by > 6 points to demonstrate decreased fall risk during functional activities.    Baseline 12 on 2/14; 02/08/2022= 13/56  04/07/22: 17  8/3: 19    Time 12    Period Weeks    Status Met    Target Date 05/03/22      PT LONG TERM GOAL #5   Title Pt will transfer from seated position on table/chair to/from transport chair/WC in order to indicate improved transfer ability.    Baseline Requires Min A for LE positioning and to prevent LOB. 02/08/2022=Patient attempted to perform on her own- Unable to push herself up from chair- Very min assist to position right LE into more flexed position and VC for hand placement. Patient able to stand with min Assist  with use of gait belt and VC to lean; 02/17/2022=Patient was able to transfer from transport w/c with some physical assist with Right to swing to pivot to side of chair. She was able to sit to stand today with only CGA and then stand pivot with right arm holding onto arm of chair. Only difficulty was stand to sit- Max VC not to plop into chair. 04/07/22: Pt performed movement of B LE to side of chair independently with chair transfer. Pt required min A with R LE foot placement of floor. Pt requires increased time to complete but complete with CGA only for safety and no cues or A to prevent falling/ plopping into armchair.  8/3: Pt requires min A for maintenance of standig balance, able to navigate from transport chairr to arm chair with hemiwalker, does not controll descent with ahands still " flops" into chair    Time 12    Period Weeks    Status Partially met    Target Date 07/28/2022       PT LONG TERM GOAL #6   Title Patient will ambulate > 150 feet with CGA using Hemiwalker on level surfaces for improved household and short community distances.    Baseline 02/08/2022= 40 feet with use of HW, heavy CGA with max VC for gait sequencing and w/c follow. 5/18 = 85 feet with use of HW, close CGA  7/6: 45 feet, heavy use of hemiwalker 8/3: 85 feet with heavy hemi walker use, no Lob throughout, cues for appropriate weight distribution; 06/28/2022 - 205 feet with use of hemiwalker, CGA   Time 12    Period Weeks    Status On-going    Target Date 07/28/2022        PT LONG TERM GOAL #7   Title Pt will increase 10MWT by at least 0.13 m/s in order to demonstrate clinically significant improvement in community ambulation.    Baseline 02/08/2022= 0.05 m/s using HW 7/6: .060ms with heavy ue use of hemiwalker and CGA for balance 8/3: .0534m 06/28/2022= 0.1 m/s (will keep goal active to ensure she is consistent)    Time 12    Period Weeks    Status Ongoing  Target Date 07/28/2022      8.  Pt will  improve 5X STS with full knee extension in standing each rep and with appropriate eccentric control in order to indicate improved transfer efficacy and improved LE strength  Baseline: 52.45 sec with UE support; 06/28/2022= 36.03 sec with UE support (VC for erect standing- patient continues to press up using back of legs against chair to stand) Goal status: ONGOING  9.  Patient will increase Berg Balance score by > 6 points to demonstrate decreased fall risk during functional activities Baseline: 19; 06/28/2022= 19/56 Goal status: ONGOING           Plan -     Clinical Impression Statement Patient continues to have difficulty coordinating right LE exercises exhibiting tone and weakness as limiting factors. She did some better with practice but still remains very limited with knee flex and hip flex strength. She was abel to walk more without as many cues for gait sequencing and less stoppage with only 2-3 episodes of unsteadiness due to narrowed gait.   Pt will continue to benefit from skilled physical therapy intervention to address impairments, improve QOL, and attain therapy goals.    Personal Factors and Comorbidities Age;Comorbidity 1;Comorbidity 2;Comorbidity 3+;Time since onset of injury/illness/exacerbation;Other    Comorbidities AFib, Renal Artery thrombosis, situational depression, HTN, Pyelnephritis, Seizure activity, COnstipation, AKI    Examination-Activity Limitations Bathing;Bed Mobility;Carry;Locomotion Level;Dressing;Sit;Squat;Stairs;Stand;Toileting;Transfers    Examination-Participation Restrictions Church;Community Activity;Laundry    Stability/Clinical Decision Making Evolving/Moderate complexity    Rehab Potential Fair    PT Frequency 2x / week    PT Duration 12 weeks    PT Treatment/Interventions ADLs/Self Care Home Management;Gait training;Stair training;Therapeutic activities;Therapeutic exercise;Balance training;Neuromuscular re-education;Patient/family  education;Wheelchair mobility training;Manual techniques;Energy conservation;Dry needling;Passive range of motion;Taping;Spinal Manipulations;Joint Manipulations    PT Next Visit Plan Gait training, Transfer training, LE strengthening as appropriate.    PT Home Exercise Plan HEP: general LE and postural exercises    Consulted and Agree with Plan of Care Patient            Lewis Moccasin PT   07/07/22, 3:03 PM

## 2022-07-11 ENCOUNTER — Telehealth: Payer: Self-pay | Admitting: *Deleted

## 2022-07-11 ENCOUNTER — Encounter: Payer: Self-pay | Admitting: *Deleted

## 2022-07-11 NOTE — Patient Outreach (Signed)
  Care Coordination   07/11/2022 Name: Lindsey [name] MRN: [id number] DOB: 07/21/1956   Care Coordination Outreach Attempts:  An unsuccessful telephone outreach was attempted today to offer the patient information about available care coordination services as a benefit of their health plan.   Follow Up Plan:  Additional outreach attempts will be made to offer the patient care coordination information and services.   Encounter Outcome:  No Answer  Care Coordination Interventions Activated:  No   Care Coordination Interventions:  No, not indicated    Jacqlyn Larsen [facility], [place] RN Care Coordinator [phone]

## 2022-07-11 NOTE — Patient Outreach (Signed)
  Care Coordination   Initial Visit Note   07/11/2022 Name: Britzy Graul MRN: [id number] DOB: 07-11-1956  Lindsey [name] is a 66 y.o. year old female who sees Raulkar, Clide Deutscher, MD and Dr. Clide Deutscher for primary care. I spoke with  Lindsey [name] by phone today.  What matters to the patients health and wellness today?  "To continue doing well and doing physical therapy"    Goals Addressed               This Visit's Progress     COMPLETED: "to continue doing well and doing physical therapy" (pt-stated)        Care Coordination Interventions: Patient interviewed about adult health maintenance status including  importance of Annual Wellness Visit, patient's daughter reports pt sees Dr. Clide Deutscher as primary care provider, also sees Dr. Ranell Patrick for care, reports doctor completes AWV. Provided education about taking medication as prescribed, continuing to attend outpatient PT/ OT for spasticity, post CVA, daughter states the PT/ OT visits are helping and pt has been going for awhile. Daughter reports pt has medicare and medicaid and is able to afford medications, has adequate transportation and oversight for medications, etc., has had no falls, has a walker. Care coordination program explained, patient and daughter appreciative and agreeable to today's outreach but decline any further outreach.          SDOH assessments and interventions completed:  Yes  SDOH Interventions Today    Flowsheet Row Most Recent Value  SDOH Interventions   Food Insecurity Interventions Intervention Not Indicated  Transportation Interventions Intervention Not Indicated        Care Coordination Interventions Activated:  Yes  Care Coordination Interventions:  Yes, provided   Follow up plan: No further intervention required.   Encounter Outcome:  Pt. Visit Completed   Jacqlyn Larsen [facility], BSN [facility] RN Care Coordinator [phone]

## 2022-07-12 ENCOUNTER — Ambulatory Visit: Payer: Medicare Other | Admitting: Occupational Therapy

## 2022-07-12 ENCOUNTER — Ambulatory Visit: Payer: Medicare Other

## 2022-07-12 ENCOUNTER — Encounter: Payer: Medicare Other | Admitting: Speech Pathology

## 2022-07-14 ENCOUNTER — Ambulatory Visit: Payer: Medicare Other

## 2022-07-14 ENCOUNTER — Ambulatory Visit: Payer: Medicare Other | Admitting: Occupational Therapy

## 2022-07-14 ENCOUNTER — Encounter: Payer: Medicare Other | Admitting: Speech Pathology

## 2022-07-14 DIAGNOSIS — I63512 Cerebral infarction due to unspecified occlusion or stenosis of left middle cerebral artery: Secondary | ICD-10-CM

## 2022-07-14 DIAGNOSIS — R2681 Unsteadiness on feet: Secondary | ICD-10-CM

## 2022-07-14 DIAGNOSIS — R278 Other lack of coordination: Secondary | ICD-10-CM

## 2022-07-14 DIAGNOSIS — R2689 Other abnormalities of gait and mobility: Secondary | ICD-10-CM

## 2022-07-14 DIAGNOSIS — M6281 Muscle weakness (generalized): Secondary | ICD-10-CM

## 2022-07-14 DIAGNOSIS — R262 Difficulty in walking, not elsewhere classified: Secondary | ICD-10-CM

## 2022-07-14 DIAGNOSIS — R269 Unspecified abnormalities of gait and mobility: Secondary | ICD-10-CM

## 2022-07-14 NOTE — Therapy (Signed)
OUTPATIENT PHYSICAL THERAPY TREATMENT NOTE  Patient Name: Lindsey [name] MRN: [id number] DOB:Oct 11, 1955, 66 y.o., female Today's Date: 07/15/2022  PCP: Dr. Tomasa Hose MD REFERRING PROVIDER: Leeroy Cha MD   PT End of Session - 07/14/22 1105     Visit Number 46    Number of Visits 69    Date for PT Re-Evaluation 07/28/22    Authorization Type Medicare    Authorization Time Period Cert through 12/02/52; Recert 06/10/2640-02/07/3093; Recert 0/04/6807-81/10    Progress Note Due on Visit 60    PT Start Time 1103    PT Stop Time 1143    PT Time Calculation (min) 40 min    Equipment Utilized During Treatment Gait belt    Activity Tolerance No increased pain;Patient tolerated treatment well    Behavior During Therapy [facility] for tasks assessed/performed                      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 No pertinent surgical history. 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 ([place]) 09/24/2021   Cerebral embolism with cerebral infarction 09/23/2021   Pyelonephritis 09/19/2021   Pain due to onychomycosis of toenails of both feet 11/19/2020   Normocytic anemia 05/31/2020   Acute ischemic left MCA stroke ([place]) 04/30/2020   Right hemiplegia ([place]) 04/30/2020   Cerebrovascular accident  ([place]) 04/21/2020   Atrial fibrillation with RVR ([place]) 04/21/2020   Essential hypertension 04/21/2020   Alcohol abuse 04/21/2020    REFERRING DIAG: s/p CVA  THERAPY DIAG:  Muscle weakness (generalized)  Acute ischemic left MCA stroke ([place])  Other lack of coordination  Unsteadiness on feet  Abnormality of gait and mobility  Difficulty in walking, not elsewhere classified  Other abnormalities of gait and mobility  Rationale for Evaluation and Treatment Rehabilitation  PERTINENT HISTORY: Pt was doing very well and was walking short distances wqith a cane prior to second stroke in december. At this point she is limited to wheelchair as her main means of mobility. Pt caregiver reports all detailed information. Pt has hesitation with the left lower extremity and significant weakness on the right lower extremity. Pt will take 2-3 steps when transfering. Pt caregiver assists with all transfers.Pt has shower with ledge to steps over ledge when entering and utilizes bedside commode. Pt has 2 level home with chair lift for upstairs access. Pt would like to improve ransfers, improve neglect on her R side and improve her overall strength and mobility.Pt. is a 66 y.o. 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 Pt. has Right sided weakness, and receptive, and expressive aphasia. Pt. has had a previous CVA with right sided weakness in July 2021. PMHx includes:  AFib, Renal Artery thrombosis, situational depression, HTN, Pyelnephritis, Seizure activity, COnstipation, AKI. Vitamin D deficiency, and urinary incontinence.   PRECAUTIONS: fall risk, wearing RLE AFO  SUBJECTIVE:  Patient reports fatigued after last visit and states doing well today. Admits to not walking as much at home or consistently performing her exercises.   PAIN:  Are you having pain? No      TODAY'S TREATMENT:  07/07/22    INTERVENTIONS:   Manual  therapy:   Manual stretching to right hamstring hold 30 sec  x 4 sets seated PROM to right knee - - approx 108 deg today  Therex:   Seated Hamstring curls BLE  with GTB 2 sets of 15 reps (partial ROM on right)  Seated knee ext - 2 sets of 15 (near  full of ROM as possible on right and 3# AW on left)  Seated Hip march (AAROM on right LE- essential AROM up as high as she could raise her knee then min assist for full ROM) and Resistive - 3# AW on left LE - 2sets of 15 reps Seated hip flex/abd/add up over hedgeghog x 15 reps BLE  Gait training: Patient ambulated approx 80 feet x 2 trials using hemiwalker, CGA with close w/c follow (more difficulty with sequencing and 2 LOB with turning)  Sit to stand x 12 reps focusing on UE placement consistently (patient approx 75 % without VC for placement)    PATIENT EDUCATION: Education details: Pt educated throughout session about proper posture and technique with exercises. Improved exercise technique, movement at target joints, use of target muscles after min to mod verbal, visual, tactile cues. Continued education on technique with STS.  Person educated: Patient Education method: Explanation, Demonstration, and Verbal cues Education comprehension: verbalized understanding, returned demonstration, and needs further education   HOME EXERCISE PROGRAM: Continue as previously given;    PT Short Term Goals -       PT SHORT TERM GOAL #1   Title Patient will be independent in home exercise program to improve strength/mobility for better functional independence with ADLs.    Baseline no formal HEp for LE/balance at this time; 02/08/2022=Patient continues to require VC for reminders and assist with hip march/knee ext (seated for strengthening); Reminders to continue to work on static stand at home.    Time 4    Period Weeks    Status Partially Met  04/07/22: complete somewhat regularly     Target Date 12/14/21              PT Long Term Goals -        PT LONG TERM GOAL #1   Title Patient will increase FOTO score to equal to or greater than   41  to demonstrate statistically significant improvement in mobility and quality of life.    Baseline 16 on 2/14; 02/08/2022=Patient unable to complete today- No family present but will attempt again next visit available with family present to assist in completion; 02/17/2022= 40; 06/28/2022- No caregiver present in PT session to assist in answering questions.    Time 12    Period Weeks    Status On-going    Target Date 07/28/2022       PT LONG TERM GOAL #3   Title Patient  will complete five times sit to stand test in < 30 seconds with UE assistance indicating an increased LE strength and improved balance.    Baseline 58.47 s with UE assist and with A from PT for R LE  positioning; 02/08/2022=54 sec with use of L UE support and Min physical assist to position right LE.  7/6: 30.5 sec, min A for LE positioning, some attempts did not come to full erect posture, did not let go with L UE from armchair with any attempt 05/05/22: 26.65 sec    Time 12    Period Weeks    Status Met    Target Date 05/03/22      PT LONG TERM GOAL #4   Title Patient will increase Berg Balance score by > 6 points to demonstrate decreased fall risk during functional activities.    Baseline 12 on 2/14; 02/08/2022= 13/56  04/07/22: 17  8/3: 19    Time 12    Period Weeks    Status Met    Target Date 05/03/22      PT LONG TERM GOAL #5   Title Pt will transfer from seated position on table/chair to/from transport chair/WC in order to indicate improved transfer ability.    Baseline Requires Min A for LE positioning and to prevent LOB. 02/08/2022=Patient attempted to perform on her own- Unable to push herself up from chair- Very min assist to position right LE into more flexed position and VC for hand placement. Patient able to stand with min Assist with use of gait belt and VC to lean; 02/17/2022=Patient was able to transfer from  transport w/c with some physical assist with Right to swing to pivot to side of chair. She was able to sit to stand today with only CGA and then stand pivot with right arm holding onto arm of chair. Only difficulty was stand to sit- Max VC not to plop into chair. 04/07/22: Pt performed movement of B LE to side of chair independently with chair transfer. Pt required min A with R LE foot placement of floor. Pt requires increased time to complete but complete with CGA only for safety and no cues or A to prevent falling/ plopping into armchair.  8/3: Pt requires min A for maintenance of standig balance, able to navigate from transport chairr to arm chair with hemiwalker, does not controll descent with ahands still " flops" into chair    Time 12    Period Weeks    Status Partially met    Target Date 07/28/2022       PT LONG TERM GOAL #6   Title Patient will ambulate > 150 feet with CGA using Hemiwalker on level surfaces for improved household and short community distances.    Baseline 02/08/2022= 40 feet with use of HW, heavy CGA with max VC for gait sequencing and w/c follow. 5/18 = 85 feet with use of HW, close CGA  7/6: 45 feet, heavy use of hemiwalker 8/3: 85 feet with heavy hemi walker use, no Lob throughout, cues for appropriate weight distribution; 06/28/2022 - 205 feet with use of hemiwalker, CGA   Time 12    Period Weeks    Status On-going    Target Date 07/28/2022        PT LONG TERM GOAL #7   Title Pt will increase 10MWT by at least 0.13 m/s in order to demonstrate clinically significant improvement in community ambulation.    Baseline 02/08/2022= 0.05 m/s using HW 7/6: .038ms with heavy ue use of hemiwalker and CGA for balance 8/3: .0565m 06/28/2022= 0.1 m/s (will keep goal active to ensure she is consistent)    Time 12    Period Weeks    Status Ongoing  Target Date 07/28/2022      8.  Pt will improve 5X STS with full knee extension in standing each rep and with appropriate  eccentric control in order to indicate improved transfer efficacy and improved LE strength  Baseline: 52.45 sec with UE support; 06/28/2022= 36.03 sec with UE support (VC for erect standing- patient continues to press up using back of legs against chair to stand) Goal status: ONGOING  9.  Patient will increase Berg Balance score by > 6 points to demonstrate decreased fall risk during functional activities Baseline: 19; 06/28/2022= 19/56 Goal status: ONGOING           Plan -     Clinical Impression Statement Patient continues with right LE tone/tightness with difficulty flexing right knee. She did improve with functional strength and safety with transfers requiring less VC overall today. She was more fatigued initially with 1st round of walking today but performed better on 2nd trial with improved gait sequencing and less VC.  Pt will continue to benefit from skilled physical therapy intervention to address impairments, improve QOL, and attain therapy goals.    Personal Factors and Comorbidities Age;Comorbidity 1;Comorbidity 2;Comorbidity 3+;Time since onset of injury/illness/exacerbation;Other    Comorbidities AFib, Renal Artery thrombosis, situational depression, HTN, Pyelnephritis, Seizure activity, COnstipation, AKI    Examination-Activity Limitations Bathing;Bed Mobility;Carry;Locomotion Level;Dressing;Sit;Squat;Stairs;Stand;Toileting;Transfers    Examination-Participation Restrictions Church;Community Activity;Laundry    Stability/Clinical Decision Making Evolving/Moderate complexity    Rehab Potential Fair    PT Frequency 2x / week    PT Duration 12 weeks    PT Treatment/Interventions ADLs/Self Care Home Management;Gait training;Stair training;Therapeutic activities;Therapeutic exercise;Balance training;Neuromuscular re-education;Patient/family education;Wheelchair mobility training;Manual techniques;Energy conservation;Dry needling;Passive range of motion;Taping;Spinal  Manipulations;Joint Manipulations    PT Next Visit Plan Gait training, Transfer training, LE strengthening as appropriate.    PT Home Exercise Plan HEP: general LE and postural exercises    Consulted and Agree with Plan of Care Patient            Lewis Moccasin PT   07/15/22, 10:04 AM

## 2022-07-14 NOTE — Therapy (Addendum)
Occupational Therapy Progress Note  Dates of reporting period  05/16/2022   to   07/14/2022    Patient Name: Lindsey [name] MRN: [id number] DOB:1956-07-30, 66 y.o., female Today's Date: 07/14/2022   REFERRING PROVIDER: Karie Fetch, MD   OT End of Session - 07/14/22 1050     Visit Number 60    Number of Visits 96    Date for OT Re-Evaluation 09/20/22    Authorization Type Progress report period starting 02/17/22    OT Start Time 1045    OT Stop Time 1100    OT Time Calculation (min) 15 min    Activity Tolerance Patient tolerated treatment well    Behavior During Therapy WFL for tasks assessed/performed               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) 09/24/2021   Cerebral embolism with cerebral infarction 09/23/2021   Pyelonephritis 09/19/2021   Pain due to onychomycosis of toenails of both feet 11/19/2020   Normocytic anemia 05/31/2020   Acute ischemic left MCA stroke (HCC) 04/30/2020   Right hemiplegia (HCC) 04/30/2020   Cerebrovascular accident (HCC) 04/21/2020   Atrial fibrillation with RVR (HCC) 04/21/2020   Essential hypertension 04/21/2020   Alcohol abuse 04/21/2020    RUE Measurements (Taken 04/04/22):    PROM Shoulder flexion:  7/03: 0(108)  5/18: 0(105)  PROM Shoulder abduction: 7/03: 2(446)  5/18: 0(102)  PROM Elbow:   06/28/2022: 0(0-140) 7/03: 0(0-140)  5/18: 0(0-136)  AROM Wrist extension:  06/28/2022: -18/(72) 7/03: -28(72)  5/18: -40(72)  REFERRING DIAG: CVA  THERAPY DIAG:  Muscle weakness (generalized)  Rationale for Evaluation and Treatment Rehabilitation  PERTINENT HISTORY: Pt. is a 66 y.o. 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 Pt. has Right sided weakness, and receptive, and expressive aphasia. Pt. has had a previous CVA with right sided weakness in July 2021. PMHx includes: AFib, Renal Artery thrombosis, situational depression, HTN, Pyelnephritis, Seizure activity, COnstipation, AKI. Vitamin D deficiency, and urinary incontinence/  PRECAUTIONS: Fall   SUBJECTIVE: Pt reports she works on stretching her hand every day   PAIN:  Are you having pain? No   OBJECTIVE:    Measurements were obtained, and goals were reviewed with the Pt. FOTO score 25.  Pt. continues to work on normalizing tone, and facilitating active volitional movement in the RUE, and hand to improve engagement of the RUE during ADLs, as well as improve transfers, and functional mobility for toileting. Pt. Progress with self-care and right upper extremity range of motion has been limited this progress reporting period. Pt. Continues to present with limited ROM in the  RUE, and hand. Pt. Requires consistent cues and assist to perform range of motion and engage her right upper extremity as a gross assist during daily tasks.  patient continues to work on improving, and normalizing tone in order to improve the right upper extremity functioning to increase engagement in ADLs and IADL tasks.  PATIENT EDUCATION: Education details: dressing techniques, HEP Person educated: Patient Education method: Holiday representative Education comprehension: verbalized understanding, returned demonstration, verbal cues required, and tactile cues required   [place] with ongoing HEPs for ROM to the RUE, hand, and digits.   OT SHORT TERM GOAL #1     Title Pt and caregiver will be independent with HEP for RUE     Baseline Eval: No current program. 10th visit: Caregivers  report being comfortable with exercises at home. 01/06/2022: Continue ongoing HEPs for UE. 30th visit: Pt./caregivers to continue with ROM; 40th: pt/caregivers to continue with PROM throughout RUE 05/16/2022:  Continue ongoing pt./caregiver education about HEPs for the RUE.06/28/2022: Pt. Reports consistency has been limited. 07/14/2022: Consistency with UE ROM at home has been limited.    Time 6    Period Weeks     Status On-going     Target Date 08/09/2022                    OT Long Term Goals -                OT LONG TERM GOAL #1    Title Pt. will engage the RUE as a gross assist/atabilizer during ADLs 100% of the time     Baseline 07/14/2022: Pt. Continues to try to engage her RUE as a gross stabilizer. 06/28/2022: Pt. continues to try to engage her RUE as a gross stabilizer. Continues to stabilize the loofa 05/16/2022: Pt. Is able to place items, in her right hand, and use her right hand  to stabilize various sizes of bottles while using her left hand to open them. 04/04/2022: Pt. Is using her right hand to hold the loofa, and stabilize items. 30th visit: Pt. is initiating using her RUE as a gross stabilizer with maxA 01/06/2022: Pt. is starting to engage her RUE as a gross stabiliizer with increased cues. Eval: pt. does not currently engage her RUE during ADLs. 10th visit: Pt. continues to need to work towards using her RUE as a gross stabilizer.     Time 12     Status On-going     Target Date 09/20/2022         OT LONG TERM GOAL #2    Title Pt. will perform UE dressing using one armed dressing techniques      Baseline Eval: MaxA, 10th visit: [place] 01/06/2022: min-modA 30th visit; min0-modA, 04/04/2022: minA donning loose shirts, MaxA tighter items/sports bra, independent doffing a shirt and bra 05/16/2022: minA donning loose shirts, MaxA tighter items/sports bra, independent doffing a shirt and bra. 06/28/2022: minA donning, and doffing loose T-shirts. 07/14/2022: minA donning loose shirts, and maxA tighter fitting clothing.     Time 12     Period Weeks     Status On-going     Target Date 09/20/2022         OT LONG TERM GOAL #3    Title Pt. will require minA to stand and hike pants     Baseline Eval: MaxA, 10th visit: [place] 01/06/2022: [place] 30th visit: ModA 04/04/2022: independent with elastic pants if above the knees. MaxA  if they fall below the knee; 04/07/22: min guard-min A to reach below knees to simulate hiking pants 05/16/2022: Min guard-MinA reaching below the knee to hike pants.06/28/2022: Min-CGA reaching to hike pants below the knees. 07/14/2022:    Time 12     Period Weeks     Status On-going     Target Date 09/30/2022         OT LONG TERM GOAL #4    Title Pt. will perform LE dressing with minA     Baseline Eval: MaxA 10th visit: Pt. requires MaxA for donning her brace, shoes, and socks. ModA with pants. 01/06/2022: ModA pants, MaxA shoes, socks, and brace 30th visit: ModA pants, MaxA shoes, socks, and brace. 04/04/2022: MaxA; 04/07/22: pt can don/doff L sock and shoe with supv, dep for RLE; see above for hiking pants 05/16/2022:  Donning and doffing the Left sock and shoe with supervision. Total Assist for the RLE.06/28/2022: Supervision donning left shoe, and sock, Dep. For RLE. 07/14/2022: Supervision donning left shoe and  sock, and Dependent for RLE.    Time 12     Period Weeks     Status On-going     Target Date 09/20/22          OT LONG TERM GOAL #5    Title Pt. will improve FOTO score by 2 pt.s to reflect funcational change     Baseline Eval: FOTO score: 12, 10th visit: FOTO score 27 01/06/2022:  27 30th visit TBA at next visit secondary to no family available during the session; 04/04/22: FOTO 40 05/16/2022: 27 07/14/2022: 25    Time 12     Period Weeks     Status On-going     Target Date 09/20/22          OT LONG TERM GOAL #6    Title Patient will transfer to bedside commode or toilet with supervision.     Baseline 30th visit: min-modA. 01/06/2022: min-ModA 04/04/2022: Min-ModA 05/16/2022: min-modA 06/28/2022: min-ModA 07/14/2022: min-modA    Time 4     Period Weeks     Status On-going     Target Date 09/20/22         OT LONG TERM GOAL #7                                                      Title: Pt. Will improve AROM wrist extension by 5 degrees in preparation for improving functional  reaching.                                                                                  Baseline:       7/03: -28(72) 06/28/2022: -18(72) 07/14/2022: -18(58)            Time: 4           Period: Weeks           Status: Ongoing           Target Date: 09/20/2022   Plan - 03/24/22  1429     Clinical Impression Statement Measurements were obtained, and goals were reviewed with the Pt. FOTO score 25.  Pt. continues to work on normalizing tone, and facilitating active volitional movement in the RUE, and hand to improve engagement of the RUE during ADLs, as well as improve transfers, and functional mobility for toileting. Pt. Progress with self-care and right upper extremity range of motion has been limited this progress reporting period. Pt. Continues to present with limited ROM in the RUE, and hand. Pt. Requires consistent cues and assist to perform range of motion and engage her right upper extremity as a gross assist during daily tasks.  patient continues to work on improving, and normalizing tone in order to improve the right upper extremity functioning to increase engagement in ADLs and IADL tasks.                    OT Occupational Profile and History Detailed Assessment- Review of Records and  additional review of physical, cognitive, psychosocial history related to current functional performance    Occupational performance deficits (Please refer to evaluation for details): Leisure;IADL's;ADL's    Body Structure / Function / Physical Skills ADL;IADL;FMC;ROM;UE functional use;Strength;Decreased knowledge of use of DME;Coordination    Rehab Potential Fair    Clinical Decision Making Several treatment options, min-mod task modification necessary    Comorbidities Affecting Occupational Performance: May have comorbidities impacting occupational performance    Modification or Assistance to Complete Evaluation  Min-Moderate modification of tasks or assist with assess necessary to complete eval    OT Frequency 2x / week    OT Duration 12 weeks    OT Treatment/Interventions Self-care/ADL training;Neuromuscular education;Electrical Stimulation;Patient/family education;Therapeutic activities;Passive range of motion;Therapist, nutritional;Therapeutic exercise;DME and/or AE instruction;Moist Heat    Consulted and Agree with Plan of Care Patient             Harrel Carina, MS, OTR/L  Harrel Carina, OT 07/14/2022, 10:51 AM

## 2022-07-19 ENCOUNTER — Ambulatory Visit: Payer: Medicare Other

## 2022-07-19 ENCOUNTER — Encounter: Payer: Medicare Other | Admitting: Speech Pathology

## 2022-07-19 ENCOUNTER — Ambulatory Visit: Payer: Medicare Other | Admitting: Occupational Therapy

## 2022-07-21 ENCOUNTER — Ambulatory Visit: Payer: Medicare Other | Admitting: Occupational Therapy

## 2022-07-21 ENCOUNTER — Encounter: Payer: Medicare Other | Admitting: Speech Pathology

## 2022-07-21 ENCOUNTER — Ambulatory Visit: Payer: Medicare Other

## 2022-07-21 DIAGNOSIS — R278 Other lack of coordination: Secondary | ICD-10-CM

## 2022-07-21 DIAGNOSIS — I63512 Cerebral infarction due to unspecified occlusion or stenosis of left middle cerebral artery: Secondary | ICD-10-CM

## 2022-07-21 DIAGNOSIS — M6281 Muscle weakness (generalized): Secondary | ICD-10-CM

## 2022-07-21 DIAGNOSIS — R269 Unspecified abnormalities of gait and mobility: Secondary | ICD-10-CM

## 2022-07-21 DIAGNOSIS — I63542 Cerebral infarction due to unspecified occlusion or stenosis of left cerebellar artery: Secondary | ICD-10-CM

## 2022-07-21 DIAGNOSIS — R2681 Unsteadiness on feet: Secondary | ICD-10-CM

## 2022-07-21 DIAGNOSIS — R262 Difficulty in walking, not elsewhere classified: Secondary | ICD-10-CM

## 2022-07-21 DIAGNOSIS — R2689 Other abnormalities of gait and mobility: Secondary | ICD-10-CM

## 2022-07-21 NOTE — Therapy (Signed)
OCCUPATIONAL THERAPY TREATMENT NOTE   Patient Name: Johnye Ostroff MRN: [id number] DOB:[birth year], 66 y.o., female Today's Date: 07/21/2022   REFERRING PROVIDER: Karie Fetch, MD   OT End of Session - 07/21/22 1034     Visit Number 61    Number of Visits 96    Date for OT Re-Evaluation 09/20/22    Authorization Type Progress report period starting 02/17/22    OT Start Time 1030    OT Stop Time 1100    OT Time Calculation (min) 30 min    Activity Tolerance Patient tolerated treatment well    Behavior During Therapy WFL for tasks assessed/performed               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) 09/24/2021   Cerebral embolism with cerebral infarction 09/23/2021   Pyelonephritis 09/19/2021   Pain due to onychomycosis of toenails of both feet 11/19/2020   Normocytic anemia 05/31/2020   Acute ischemic left MCA stroke (HCC) 04/30/2020   Right hemiplegia (HCC) 04/30/2020   Cerebrovascular accident (HCC) 04/21/2020   Atrial fibrillation with RVR (HCC) 04/21/2020   Essential hypertension 04/21/2020   Alcohol abuse 04/21/2020    RUE Measurements (Taken 04/04/22):   PROM Shoulder flexion:  7/03: 0(108)  5/18:  0(105)  PROM Shoulder abduction: 7/03: 3(614)  5/18: 0(102)  PROM Elbow:   06/28/2022: 0(0-140) 7/03: 0(0-140)  5/18: 0(0-136)  AROM Wrist extension:  06/28/2022: -18/(72) 7/03: -28(72)  5/18: -40(72)  REFERRING DIAG: CVA  THERAPY DIAG:  Muscle weakness (generalized)  Rationale for Evaluation and Treatment Rehabilitation  PERTINENT HISTORY: Pt. is a 66 y.o. 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 Pt. has Right sided weakness, and receptive, and expressive aphasia. Pt. has had a previous CVA with right sided weakness in July 2021. PMHx includes: AFib, Renal Artery thrombosis, situational depression, HTN, Pyelnephritis, Seizure activity, COnstipation, AKI. Vitamin D deficiency, and urinary incontinence/  PRECAUTIONS: Fall   SUBJECTIVE:  Pt.'s  family reports that she has a long day of appointments.    PAIN:  Are you having pain? No   OBJECTIVE:    Manual Therapy:   Pt. tolerated soft tissue massage to the right scapular musculature. Pt. tolerated scapular mobilizations in elevation, depression, abduction, and rotation following moist heat modality to the right shoulder. Manual therapy was performed independent of, and in preparation for there. Ex.   Therapeutic Exercise:   Pt. tolerated PROM through all joint ranges of the RUE, and hand following moist heat modality, and manual therapy. Pt. worked on reps of active right wrist extension, and right supination with support proximally. Pt. education  was provided about opportunities to engage the RUE during daily tasks.    Pt. Was late for the therapy session this morning. Pt. Tolerated ther. Ex, and manual therapy well today however requires increased assist for self-ROM especially at the elbow. Pt. continues to require increased verbal, and tactile cues for self-ROM technique to the right UE. Pt. Presents with no pain in the right shoulder during ROM  today. Less tightness noted. Pt. continues to work on normalizing tone, and facilitating active volitional movement in the RUE, and hand to improve engagement of the RUE during ADLs, as well as improve transfers, and functional mobility for toileting.  PATIENT EDUCATION: Education details: dressing techniques, HEP Person educated: Patient Education method: Customer service manager Education comprehension: verbalized understanding, returned demonstration, verbal cues required, and tactile cues required   [place] with ongoing HEPs for ROM to the RUE, hand, and digits.   OT SHORT TERM GOAL #1     Title Pt and caregiver will be independent with HEP for RUE     Baseline Eval: No current program. 10th visit: Caregivers  report being comfortable with exercises at home. 01/06/2022: Continue ongoing HEPs for UE. 30th visit: Pt./caregivers to continue with ROM; 40th: pt/caregivers to continue with PROM throughout RUE 05/16/2022:  Continue ongoing pt./caregiver education about HEPs for the RUE.06/28/2022: Pt. Reports consistency has been limited. 07/14/2022: Consistency with UE ROM at home has been limited.    Time 6    Period Weeks     Status On-going     Target Date 08/09/2022                    OT Long Term Goals -                OT LONG TERM GOAL #1    Title Pt. will engage the RUE as a gross assist/atabilizer during ADLs 100% of the time     Baseline 07/14/2022: Pt. Continues to try to engage her RUE as a gross stabilizer. 06/28/2022: Pt. continues to try to engage her RUE as a gross stabilizer. Continues to stabilize the loofa 05/16/2022: Pt. Is able to place items, in her right hand, and use her right hand  to stabilize various sizes of bottles while using her left hand to open them. 04/04/2022: Pt. Is using her right hand to hold the loofa, and stabilize items. 30th visit: Pt. is initiating using her RUE as a gross stabilizer with maxA 01/06/2022: Pt. is starting  to engage her RUE as a gross stabiliizer with increased cues. Eval: pt. does not currently engage her RUE during ADLs. 10th visit: Pt. continues to need to work towards using her RUE as a gross stabilizer.     Time 12     Status On-going     Target Date 09/20/2022         OT LONG TERM GOAL #2    Title Pt. will perform UE dressing using one armed dressing techniques     Baseline Eval: MaxA, 10th visit: [place] 01/06/2022: min-modA 30th visit; min0-modA, 04/04/2022: minA donning loose shirts, MaxA tighter items/sports bra, independent doffing a shirt and bra 05/16/2022: minA donning loose shirts, MaxA tighter items/sports bra, independent doffing a shirt and bra. 06/28/2022: minA donning, and doffing loose T-shirts. 07/14/2022: minA donning loose shirts, and maxA tighter fitting clothing.     Time 12     Period Weeks     Status On-going  Target Date 09/20/2022         OT LONG TERM GOAL #3    Title Pt. will require minA to stand and hike pants     Baseline Eval: MaxA, 10th visit: [place] 01/06/2022: [place] 30th visit: ModA 04/04/2022: independent with elastic pants if above the knees. MaxA if they fall below the knee; 04/07/22: min guard-min A to reach below knees to simulate hiking pants 05/16/2022: Min guard-MinA reaching below the knee to hike pants.06/28/2022: Min-CGA reaching to hike pants below the knees. 07/14/2022:    Time 12     Period Weeks     Status On-going     Target Date 09/30/2022         OT LONG TERM GOAL #4    Title Pt. will perform LE dressing with minA     Baseline Eval: MaxA 10th visit: Pt. requires MaxA for donning her brace, shoes, and socks. ModA with pants. 01/06/2022: ModA pants, MaxA shoes, socks, and brace 30th visit: ModA pants, MaxA shoes, socks, and brace. 04/04/2022: MaxA; 04/07/22: pt can don/doff L sock and shoe with supv, dep for RLE; see above for hiking pants 05/16/2022:  Donning and doffing the Left sock and shoe with supervision. Total Assist for the RLE.06/28/2022: Supervision  donning left shoe, and sock, Dep. For RLE. 07/14/2022: Supervision donning left shoe and  sock, and Dependent for RLE.    Time 12     Period Weeks     Status On-going     Target Date 09/20/22          OT LONG TERM GOAL #5    Title Pt. will improve FOTO score by 2 pt.s to reflect funcational change     Baseline Eval: FOTO score: 12, 10th visit: FOTO score 27 01/06/2022: 27 30th visit TBA at next visit secondary to no family available during the session; 04/04/22: FOTO 40 05/16/2022: 27 07/14/2022: 25    Time 12     Period Weeks     Status On-going     Target Date 09/20/22          OT LONG TERM GOAL #6    Title Patient will transfer to bedside commode or toilet with supervision.     Baseline 30th visit: min-modA. 01/06/2022: min-ModA 04/04/2022: Min-ModA 05/16/2022: min-modA 06/28/2022: min-ModA 07/14/2022: min-modA    Time 4     Period Weeks     Status On-going     Target Date 09/20/22         OT LONG TERM GOAL #7                                                      Title: Pt. Will improve AROM wrist extension by 5 degrees in preparation for improving functional  reaching.                                                                                  Baseline:       7/03: -28(72) 06/28/2022: -18(72) 07/14/2022: -18(58)  Time: 4           Period: Weeks           Status: Ongoing           Target Date: 09/20/2022   Plan - 03/24/22 1429     Clinical Impression Statement Pt. Was late for the therapy session this morning. Pt. Tolerated ther. Ex, and manual therapy well today however requires increased assist for self-ROM especially at the elbow. Pt. continues to require increased verbal, and tactile cues for self-ROM technique to the right UE. Pt. Presents with no pain in the right shoulder during ROM today. Less tightness noted. Pt. continues to work on normalizing tone, and facilitating active volitional movement in the RUE, and hand to improve engagement of the RUE during ADLs, as  well as improve transfers, and functional mobility for toileting.               OT Occupational Profile and History Detailed Assessment- Review of Records and additional review of physical, cognitive, psychosocial history related to current functional performance    Occupational performance deficits (Please refer to evaluation for details): Leisure;IADL's;ADL's    Body Structure / Function / Physical Skills ADL;IADL;FMC;ROM;UE functional use;Strength;Decreased knowledge of use of DME;Coordination    Rehab Potential Fair    Clinical Decision Making Several treatment options, min-mod task modification necessary    Comorbidities Affecting Occupational Performance: May have comorbidities impacting occupational performance    Modification or Assistance to Complete Evaluation  Min-Moderate modification of tasks or assist with assess necessary to complete eval    OT Frequency 2x / week    OT Duration 12 weeks    OT Treatment/Interventions Self-care/ADL training;Neuromuscular education;Electrical Stimulation;Patient/family education;Therapeutic activities;Passive range of motion;Therapist, nutritional;Therapeutic exercise;DME and/or AE instruction;Moist Heat    Consulted and Agree with Plan of Care Patient             Harrel Carina, MS, OTR/L  Harrel Carina, OT 07/21/2022, 10:35 AM

## 2022-07-21 NOTE — Therapy (Signed)
OUTPATIENT PHYSICAL THERAPY TREATMENT NOTE  Patient Name: Lindsey [name] MRN: [id number] DOB:[birth year], 66 y.o., female Today's Date: 07/22/2022  PCP: Dr. Tomasa Hose MD REFERRING PROVIDER: Leeroy Cha MD   PT End of Session - 07/21/22 1057     Visit Number 54    Number of Visits 90    Date for PT Re-Evaluation 07/28/22    Authorization Type Medicare    Authorization Time Period Cert through 03/04/36; Recert 03/04/8314-10/09/6158; Recert 04/04/7105-26/94    Progress Note Due on Visit 60    PT Start Time 1100    PT Stop Time 1130    PT Time Calculation (min) 30 min    Equipment Utilized During Treatment Gait belt    Activity Tolerance No increased pain;Patient tolerated treatment well    Behavior During Therapy [facility] for tasks assessed/performed                      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 No pertinent surgical history. 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 ([place]) 09/24/2021   Cerebral embolism with cerebral infarction 09/23/2021   Pyelonephritis 09/19/2021   Pain due to onychomycosis of toenails of both feet 11/19/2020   Normocytic anemia 05/31/2020   Acute ischemic left MCA stroke ([place]) 04/30/2020   Right hemiplegia ([place]) 04/30/2020   Cerebrovascular accident  ([place]) 04/21/2020   Atrial fibrillation with RVR ([place]) 04/21/2020   Essential hypertension 04/21/2020   Alcohol abuse 04/21/2020    REFERRING DIAG: s/p CVA  THERAPY DIAG:  Muscle weakness (generalized)  Acute ischemic left MCA stroke (HCC)  Other lack of coordination  Unsteadiness on feet  Abnormality of gait and mobility  Difficulty in walking, not elsewhere classified  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 At this point she is limited to wheelchair as her main means of mobility. Pt caregiver reports all detailed information. Pt has hesitation with the left lower extremity and significant weakness on the right lower extremity. Pt will take 2-3 steps when transfering. Pt caregiver assists with all transfers.Pt has shower with ledge to steps over ledge when entering and utilizes bedside commode. Pt has 2 level home with chair lift for upstairs access. Pt would like to improve ransfers, improve neglect on her R side and improve her overall strength and mobility.Pt. is a 66 y.o. 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 Pt. has Right sided weakness, and receptive, and expressive aphasia. Pt.  has had a previous CVA with right sided weakness in July 2021. PMHx includes: AFib, Renal Artery thrombosis, situational depression, HTN, Pyelnephritis, Seizure activity, COnstipation, AKI. Vitamin D deficiency, and urinary incontinence.   PRECAUTIONS: fall risk, wearing RLE AFO  SUBJECTIVE:  Patient reports doing well but has to leave early as she has several more appointments including Dentist immediately following her PT visit.   PAIN:  Are you  having pain? No      TODAY'S TREATMENT:  07/21/22    INTERVENTIONS:   Therapeutic Activity:   Sit to stand - hand placement x 5 Turning 180 deg and backing up Ambulation with HW, CGA, gait belt x 4 trials: Focusing on sequencing with hemiwalker placement and gait cadence 1) 3 min 23 sec (very unsteady initially walking and turning)  2) 2 min 19 sec (increased VC for gait sequencing- placement of Hemi walker)  3) 2 min 0 sec  4) 1 min 24 sec (min to no VC on walk back)   Therex:   Seated Hamstring curls BLE  with RTB 2 sets of 15 reps (partial ROM on right)  Seated knee ext - 2 sets of 15 RIGHT LE (Due to time constraints)  Seated Hip march (AAROM on right LE- essential AROM up as high as she could raise her knee then min assist for full ROM) x 15 reps   PATIENT EDUCATION: Education details: Pt educated throughout session about proper posture and technique with exercises. Improved exercise technique, movement at target joints, use of target muscles after min to mod verbal, visual, tactile cues. Continued education on technique with STS.  Person educated: Patient Education method: Explanation, Demonstration, and Verbal cues Education comprehension: verbalized understanding, returned demonstration, and needs further education   HOME EXERCISE PROGRAM: Continue as previously given;    PT Short Term Goals -       PT SHORT TERM GOAL #1   Title Patient will be independent in home exercise program to improve strength/mobility for better functional independence with ADLs.    Baseline no formal HEp for LE/balance at this time; 02/08/2022=Patient continues to require VC for reminders and assist with hip march/knee ext (seated for strengthening); Reminders to continue to work on static stand at home.    Time 4    Period Weeks    Status Partially Met  04/07/22: complete somewhat regularly     Target Date 12/14/21              PT Long Term Goals -       PT LONG TERM GOAL #1    Title Patient will increase FOTO score to equal to or greater than   41  to demonstrate statistically significant improvement in mobility and quality of life.    Baseline 16 on 2/14; 02/08/2022=Patient unable to complete today- No family present but will attempt again next visit available with family present to assist in completion; 02/17/2022= 40; 06/28/2022- No caregiver present in PT session to assist in answering questions.    Time 12    Period Weeks    Status On-going    Target Date 07/28/2022       PT LONG TERM GOAL #3   Title Patient  will complete five times sit to stand test in < 30 seconds with UE assistance indicating an increased LE strength and improved balance.    Baseline 58.47 s with UE assist and with A from PT for R LE positioning; 02/08/2022=54 sec with use of L UE support  and Min physical assist to position right LE.  7/6: 30.5 sec, min A for LE positioning, some attempts did not come to full erect posture, did not let go with L UE from armchair with any attempt 05/05/22: 26.65 sec    Time 12    Period Weeks    Status Met    Target Date 05/03/22      PT LONG TERM GOAL #4   Title Patient will increase Berg Balance score by > 6 points to demonstrate decreased fall risk during functional activities.    Baseline 12 on 2/14; 02/08/2022= 13/56  04/07/22: 17  8/3: 19    Time 12    Period Weeks    Status Met    Target Date 05/03/22      PT LONG TERM GOAL #5   Title Pt will transfer from seated position on table/chair to/from transport chair/WC in order to indicate improved transfer ability.    Baseline Requires Min A for LE positioning and to prevent LOB. 02/08/2022=Patient attempted to perform on her own- Unable to push herself up from chair- Very min assist to position right LE into more flexed position and VC for hand placement. Patient able to stand with min Assist with use of gait belt and VC to lean; 02/17/2022=Patient was able to transfer from transport w/c with some physical  assist with Right to swing to pivot to side of chair. She was able to sit to stand today with only CGA and then stand pivot with right arm holding onto arm of chair. Only difficulty was stand to sit- Max VC not to plop into chair. 04/07/22: Pt performed movement of B LE to side of chair independently with chair transfer. Pt required min A with R LE foot placement of floor. Pt requires increased time to complete but complete with CGA only for safety and no cues or A to prevent falling/ plopping into armchair.  8/3: Pt requires min A for maintenance of standig balance, able to navigate from transport chairr to arm chair with hemiwalker, does not controll descent with ahands still " flops" into chair    Time 12    Period Weeks    Status Partially met    Target Date 07/28/2022       PT LONG TERM GOAL #6   Title Patient will ambulate > 150 feet with CGA using Hemiwalker on level surfaces for improved household and short community distances.    Baseline 02/08/2022= 40 feet with use of HW, heavy CGA with max VC for gait sequencing and w/c follow. 5/18 = 85 feet with use of HW, close CGA  7/6: 45 feet, heavy use of hemiwalker 8/3: 85 feet with heavy hemi walker use, no Lob throughout, cues for appropriate weight distribution; 06/28/2022 - 205 feet with use of hemiwalker, CGA   Time 12    Period Weeks    Status On-going    Target Date 07/28/2022        PT LONG TERM GOAL #7   Title Pt will increase 10MWT by at least 0.13 m/s in order to demonstrate clinically significant improvement in community ambulation.    Baseline 02/08/2022= 0.05 m/s using HW 7/6: .065ms with heavy ue use of hemiwalker and CGA for balance 8/3: .0531m 06/28/2022= 0.1 m/s (will keep goal active to ensure she is consistent)    Time 12    Period Weeks    Status Ongoing     Target Date 07/28/2022      8.  Pt will improve 5X STS with full knee extension in standing each rep and with appropriate eccentric control in order to indicate  improved transfer efficacy and improved LE strength  Baseline: 52.45 sec with UE support; 06/28/2022= 36.03 sec with UE support (VC for erect standing- patient continues to press up using back of legs against chair to stand) Goal status: ONGOING  9.  Patient will increase Berg Balance score by > 6 points to demonstrate decreased fall risk during functional activities Baseline: 19; 06/28/2022= 19/56 Goal status: ONGOING           Plan -     Clinical Impression Statement Treatment limited secondary to fact that patient needed to leave 15 min early to make it to another appointment. She initially struggled more with standing and walking requiring more VC yet she did vastly improve throughout session- exhibiting improving gait sequencing and cadence with VC/TC. She was twice as fast with last walk than she was on first covering same distance.  Pt will continue to benefit from skilled physical therapy intervention to address impairments, improve QOL, and attain therapy goals.    Personal Factors and Comorbidities Age;Comorbidity 1;Comorbidity 2;Comorbidity 3+;Time since onset of injury/illness/exacerbation;Other    Comorbidities AFib, Renal Artery thrombosis, situational depression, HTN, Pyelnephritis, Seizure activity, COnstipation, AKI    Examination-Activity Limitations Bathing;Bed Mobility;Carry;Locomotion Level;Dressing;Sit;Squat;Stairs;Stand;Toileting;Transfers    Examination-Participation Restrictions Church;Community Activity;Laundry    Stability/Clinical Decision Making Evolving/Moderate complexity    Rehab Potential Fair    PT Frequency 2x / week    PT Duration 12 weeks    PT Treatment/Interventions ADLs/Self Care Home Management;Gait training;Stair training;Therapeutic activities;Therapeutic exercise;Balance training;Neuromuscular re-education;Patient/family education;Wheelchair mobility training;Manual techniques;Energy conservation;Dry needling;Passive range of motion;Taping;Spinal  Manipulations;Joint Manipulations    PT Next Visit Plan Gait training, Transfer training, LE strengthening as appropriate.    PT Home Exercise Plan HEP: general LE and postural exercises    Consulted and Agree with Plan of Care Patient            Lewis Moccasin PT   07/22/22, 7:37 AM

## 2022-07-26 ENCOUNTER — Ambulatory Visit: Payer: Medicare Other | Admitting: Occupational Therapy

## 2022-07-26 ENCOUNTER — Encounter: Payer: Medicare Other | Admitting: Speech Pathology

## 2022-07-26 ENCOUNTER — Ambulatory Visit: Payer: Medicare Other

## 2022-07-26 ENCOUNTER — Encounter: Payer: Self-pay | Admitting: Occupational Therapy

## 2022-07-26 NOTE — Therapy (Signed)
[place] MAIN [facility] SERVICES [address] [place], [place], [address] Phone: [phone]   Fax:  [phone]  Patient Details  Name: Lindsey [name] MRN: [id number] Date of Birth: [birth year] Referring Provider:  No ref. provider found  Encounter Date: 07/26/2022    Pt. Did not arrive for her OT appointment this morning. An attempt was made to follow-up, and reach out to the Pt. via telephone. A message was left on voicemail with the return phone number to our clinic.   Harrel Carina, MS, OTR/L   Harrel Carina, OT 07/26/2022, 10:36 AM  [place] MAIN [facility] SERVICES [address] [place], [place], [address] Phone: [phone]   Fax:  [phone]

## 2022-07-26 NOTE — Therapy (Incomplete)
OUTPATIENT PHYSICAL THERAPY TREATMENT NOTE  Patient Name: Lindsey [name] MRN: [id number] DOB:[birth year], 66 y.o., female Today's Date: 07/26/2022  PCP: Dr. 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  Rationale for Evaluation and Treatment Rehabilitation  PERTINENT HISTORY: Pt was doing very well and was walking short distances wqith a cane prior to second stroke in december. At this point she is limited to wheelchair as her main means of mobility. Pt caregiver reports all detailed information. Pt has hesitation with the left lower extremity and significant weakness on  the right lower extremity. Pt will take 2-3 steps when transfering. Pt caregiver assists with all transfers.Pt has shower with ledge to steps over ledge when entering and utilizes bedside commode. Pt has 2 level home with chair lift for upstairs access. Pt would like to improve ransfers, improve neglect on her R side and improve her overall strength and mobility.Pt. is a 66 y.o. 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 Pt. has Right sided weakness, and receptive, and expressive aphasia. Pt. has had a previous CVA with right sided weakness in July 2021. PMHx includes: AFib, Renal Artery thrombosis, situational depression, HTN, Pyelnephritis, Seizure activity, COnstipation, AKI. Vitamin D deficiency, and urinary incontinence.   PRECAUTIONS: fall risk, wearing RLE AFO  SUBJECTIVE:  Patient reports doing well but has to leave early as she has several more appointments including Dentist immediately following her PT visit.   PAIN:  Are you having pain? No      TODAY'S TREATMENT:  07/21/22    INTERVENTIONS:   Therapeutic Activity:   Sit to stand - hand placement x 5 Turning 180 deg and backing up Ambulation with HW, CGA, gait belt x 4 trials: Focusing on sequencing with hemiwalker placement and gait cadence 1) 3 min 23 sec (very unsteady initially walking and turning)  2) 2 min 19 sec (increased VC for gait sequencing- placement of Hemi walker)  3) 2 min 0 sec  4) 1 min 24 sec (min to no VC on walk back)  Therex:   Seated Hamstring curls BLE  with RTB 2 sets of 15 reps (partial ROM on right)  Seated knee ext - 2 sets of 15 RIGHT LE (Due to time constraints)  Seated Hip march (AAROM on right LE- essential AROM up as high as she could raise her knee then min assist for full ROM) x 15 reps   PATIENT EDUCATION: Education details: Pt educated throughout session about proper posture and technique with exercises.  Improved exercise technique, movement at target joints, use of target muscles after min to mod verbal, visual, tactile cues. Continued education on technique with STS.  Person educated: Patient Education method: Explanation, Demonstration, and Verbal cues Education comprehension: verbalized understanding, returned demonstration, and needs further education   HOME EXERCISE PROGRAM: Continue as previously given;    PT Short Term Goals -       PT SHORT TERM GOAL #1   Title Patient will be independent in home exercise program to improve strength/mobility for better functional independence with ADLs.    Baseline no formal HEp for LE/balance at this time; 02/08/2022=Patient continues to require VC for reminders and assist with hip march/knee ext (seated for strengthening); Reminders to continue to work on static stand at home.    Time 4    Period Weeks    Status Partially Met  04/07/22: complete somewhat regularly     Target Date 12/14/21              PT Long Term Goals -       PT LONG TERM GOAL #1   Title Patient will increase FOTO score to equal to or greater than   41  to demonstrate statistically significant improvement in mobility and quality of life.    Baseline 16 on 2/14; 02/08/2022=Patient unable to complete today- No family present but will attempt again next visit available with family present to assist in completion; 02/17/2022= 40; 06/28/2022- No caregiver present in PT session to assist in answering questions.    Time 12    Period Weeks    Status On-going    Target Date 07/28/2022       PT LONG TERM GOAL #3   Title Patient  will complete five times sit to stand test in < 30 seconds with UE assistance indicating an increased LE strength and improved balance.    Baseline 58.47 s with UE assist and with A from PT for R LE positioning; 02/08/2022=54 sec with use of L UE support and Min physical assist to position right LE.  7/6: 30.5 sec, min A for LE positioning, some attempts  did not come to full erect posture, did not let go with L UE from armchair with any attempt 05/05/22: 26.65 sec    Time 12    Period Weeks    Status Met    Target Date 05/03/22      PT LONG TERM GOAL #4   Title Patient will increase Berg Balance score by > 6 points to demonstrate decreased fall risk during functional activities.    Baseline 12 on 2/14; 02/08/2022= 13/56  04/07/22: 17  8/3: 19    Time 12    Period Weeks    Status Met    Target Date 05/03/22      PT LONG TERM GOAL #5   Title Pt will transfer from seated position on table/chair to/from transport chair/WC in order to indicate improved transfer ability.    Baseline Requires Min A for LE positioning and to  prevent LOB. 02/08/2022=Patient attempted to perform on her own- Unable to push herself up from chair- Very min assist to position right LE into more flexed position and VC for hand placement. Patient able to stand with min Assist with use of gait belt and VC to lean; 02/17/2022=Patient was able to transfer from transport w/c with some physical assist with Right to swing to pivot to side of chair. She was able to sit to stand today with only CGA and then stand pivot with right arm holding onto arm of chair. Only difficulty was stand to sit- Max VC not to plop into chair. 04/07/22: Pt performed movement of B LE to side of chair independently with chair transfer. Pt required min A with R LE foot placement of floor. Pt requires increased time to complete but complete with CGA only for safety and no cues or A to prevent falling/ plopping into armchair.  8/3: Pt requires min A for maintenance of standig balance, able to navigate from transport chairr to arm chair with hemiwalker, does not controll descent with ahands still " flops" into chair    Time 12    Period Weeks    Status Partially met    Target Date 07/28/2022       PT LONG TERM GOAL #6   Title Patient will ambulate > 150 feet with CGA using Hemiwalker on level surfaces for  improved household and short community distances.    Baseline 02/08/2022= 40 feet with use of HW, heavy CGA with max VC for gait sequencing and w/c follow. 5/18 = 85 feet with use of HW, close CGA  7/6: 45 feet, heavy use of hemiwalker 8/3: 85 feet with heavy hemi walker use, no Lob throughout, cues for appropriate weight distribution; 06/28/2022 - 205 feet with use of hemiwalker, CGA   Time 12    Period Weeks    Status On-going    Target Date 07/28/2022        PT LONG TERM GOAL #7   Title Pt will increase 10MWT by at least 0.13 m/s in order to demonstrate clinically significant improvement in community ambulation.    Baseline 02/08/2022= 0.05 m/s using HW 7/6: .030ms with heavy ue use of hemiwalker and CGA for balance 8/3: .05108m 06/28/2022= 0.1 m/s (will keep goal active to ensure she is consistent)    Time 12    Period Weeks    Status Ongoing     Target Date 07/28/2022      8.  Pt will improve 5X STS with full knee extension in standing each rep and with appropriate eccentric control in order to indicate improved transfer efficacy and improved LE strength  Baseline: 52.45 sec with UE support; 06/28/2022= 36.03 sec with UE support (VC for erect standing- patient continues to press up using back of legs against chair to stand) Goal status: ONGOING  9.  Patient will increase Berg Balance score by > 6 points to demonstrate decreased fall risk during functional activities Baseline: 19; 06/28/2022= 19/56 Goal status: ONGOING           Plan -     Clinical Impression Statement Treatment limited secondary to fact that patient needed to leave 15 min early to make it to another appointment. She initially struggled more with standing and walking requiring more VC yet she did vastly improve throughout session- exhibiting improving gait sequencing and cadence with VC/TC. She was twice as fast with last walk than she was on first covering same distance.  Pt will continue to benefit from skilled  physical therapy intervention to address impairments, improve QOL, and attain therapy goals.    Personal Factors and Comorbidities Age;Comorbidity 1;Comorbidity 2;Comorbidity 3+;Time since onset of injury/illness/exacerbation;Other    Comorbidities AFib, Renal Artery thrombosis, situational depression, HTN, Pyelnephritis, Seizure activity, COnstipation, AKI    Examination-Activity Limitations Bathing;Bed Mobility;Carry;Locomotion Level;Dressing;Sit;Squat;Stairs;Stand;Toileting;Transfers    Examination-Participation Restrictions Church;Community Activity;Laundry    Stability/Clinical Decision Making Evolving/Moderate complexity    Rehab Potential Fair    PT Frequency 2x / week    PT Duration 12 weeks    PT Treatment/Interventions ADLs/Self Care Home Management;Gait training;Stair training;Therapeutic activities;Therapeutic exercise;Balance training;Neuromuscular re-education;Patient/family education;Wheelchair mobility training;Manual techniques;Energy conservation;Dry needling;Passive range of motion;Taping;Spinal Manipulations;Joint Manipulations    PT Next Visit Plan Gait training, Transfer training, LE strengthening as appropriate.    PT Home Exercise Plan HEP: general LE and postural exercises    Consulted and Agree with Plan of Care Patient            Lewis Moccasin PT   07/26/22, 8:42 AM

## 2022-07-28 ENCOUNTER — Ambulatory Visit: Payer: Medicare Other

## 2022-07-28 ENCOUNTER — Ambulatory Visit: Payer: Medicare Other | Admitting: Physical Medicine and Rehabilitation

## 2022-07-28 ENCOUNTER — Ambulatory Visit: Payer: Medicare Other | Admitting: Occupational Therapy

## 2022-07-28 ENCOUNTER — Encounter: Payer: Medicare Other | Admitting: Speech Pathology

## 2022-07-29 ENCOUNTER — Other Ambulatory Visit: Payer: Self-pay | Admitting: *Deleted

## 2022-07-29 NOTE — Patient Outreach (Signed)
Ms. Antosh resides in [facility]. Screening for [facility] care coordination services as benefit of insurance plan.   [facility] social worker previously reported Ms. Tibbetts is a long term resident at [facility]  No identifiable [facility] care coordination services.   Marthenia Rolling, MSN, RN,BSN [place] Acute Care Coordinator [phone] (Direct dial)

## 2022-08-02 ENCOUNTER — Encounter: Payer: Medicare Other | Admitting: Speech Pathology

## 2022-08-02 ENCOUNTER — Ambulatory Visit: Payer: Medicare Other

## 2022-08-02 ENCOUNTER — Other Ambulatory Visit: Payer: Self-pay

## 2022-08-02 ENCOUNTER — Ambulatory Visit: Payer: Medicare Other | Admitting: Occupational Therapy

## 2022-08-02 ENCOUNTER — Encounter: Payer: Self-pay | Admitting: Emergency Medicine

## 2022-08-02 ENCOUNTER — Emergency Department
Admission: EM | Admit: 2022-08-02 | Discharge: 2022-08-02 | Disposition: A | Discharge status: Home / Self Care | Payer: Medicare Other | Attending: Emergency Medicine | Admitting: Emergency Medicine

## 2022-08-02 DIAGNOSIS — R799 Abnormal finding of blood chemistry, unspecified: Secondary | ICD-10-CM | POA: Insufficient documentation

## 2022-08-02 DIAGNOSIS — R791 Abnormal coagulation profile: Secondary | ICD-10-CM | POA: Insufficient documentation

## 2022-08-02 LAB — CBC WITH DIFFERENTIAL/PLATELET
Abs Immature Granulocytes: 0.01 10*3/uL (ref 0.00–0.07)
Basophils Absolute: 0 10*3/uL (ref 0.0–0.1)
Basophils Relative: 0 %
Eosinophils Absolute: 0.1 10*3/uL (ref 0.0–0.5)
Eosinophils Relative: 2 %
HCT: 30.3 % — ABNORMAL LOW (ref 36.0–46.0)
Hemoglobin: 10.3 g/dL — ABNORMAL LOW (ref 12.0–15.0)
Immature Granulocytes: 0 %
Lymphocytes Relative: 30 %
Lymphs Abs: 1.4 10*3/uL (ref 0.7–4.0)
MCH: 27.8 pg (ref 26.0–34.0)
MCHC: 34 g/dL (ref 30.0–36.0)
MCV: 81.9 fL (ref 80.0–100.0)
Monocytes Absolute: 0.7 10*3/uL (ref 0.1–1.0)
Monocytes Relative: 15 %
Neutro Abs: 2.5 10*3/uL (ref 1.7–7.7)
Neutrophils Relative %: 53 %
Platelets: 341 10*3/uL (ref 150–400)
RBC: 3.7 MIL/uL — ABNORMAL LOW (ref 3.87–5.11)
RDW: 16.4 % — ABNORMAL HIGH (ref 11.5–15.5)
WBC: 4.6 10*3/uL (ref 4.0–10.5)
nRBC: 0 % (ref 0.0–0.2)

## 2022-08-02 LAB — COMPREHENSIVE METABOLIC PANEL
ALT: 21 U/L (ref 0–44)
AST: 24 U/L (ref 15–41)
Albumin: 3.3 g/dL — ABNORMAL LOW (ref 3.5–5.0)
Alkaline Phosphatase: 58 U/L (ref 38–126)
Anion gap: 8 (ref 5–15)
BUN: 15 mg/dL (ref 8–23)
CO2: 19 mmol/L — ABNORMAL LOW (ref 22–32)
Calcium: 9.2 mg/dL (ref 8.9–10.3)
Chloride: 114 mmol/L — ABNORMAL HIGH (ref 98–111)
Creatinine, Ser: 1.05 mg/dL — ABNORMAL HIGH (ref 0.44–1.00)
GFR, Estimated: 59 mL/min — ABNORMAL LOW (ref >60)
Glucose, Bld: 89 mg/dL (ref 70–99)
Potassium: 3.7 mmol/L (ref 3.5–5.1)
Sodium: 141 mmol/L (ref 135–145)
Total Bilirubin: 0.2 mg/dL — ABNORMAL LOW (ref 0.3–1.2)
Total Protein: 7.8 g/dL (ref 6.5–8.1)

## 2022-08-02 LAB — APTT: aPTT: 60 seconds — ABNORMAL HIGH (ref 24–36)

## 2022-08-02 LAB — PROTIME-INR
INR: 4.7 (ref 0.8–1.2)
Prothrombin Time: 44.2 seconds — ABNORMAL HIGH (ref 11.4–15.2)

## 2022-08-02 NOTE — ED Provider Notes (Signed)
Patient signed out to me pending an INR and possible vitamin K if significantly elevated.  Patient's repeat INR here is 4.7.  She is not having any bleeding.  Given this we will defer vitamin K and continue to hold the warfarin.  She will continue to have her INR checked at her facility.   Rada Hay, MD 08/02/22 [phone]

## 2022-08-02 NOTE — ED Provider Triage Note (Signed)
  Emergency Medicine Provider Triage Evaluation Note  Lindsey [name] , a 66 y.o.female,  was evaluated in triage.  Pt complains of abnormal labs.  She presents via EMS from [facility] due to elevated INR that was reportedly 8.  She is currently on Coumadin, however she has not had any Coumadin for the past 3 days.  However, her INR is progressively worsening as the days go by.  She is currently asymptomatic.   Review of Systems  Positive: N/A. Negative: Denies fever, chest pain, vomiting  Physical Exam   Vitals:   08/02/22 1344  BP: 120/88  Pulse: 81  Resp: 16  Temp: 98.2 F (36.8 C)  SpO2: 100%   Gen:   Awake, no distress   Resp:  Normal effort  MSK:   Moves extremities without difficulty  Other:    Medical Decision Making  Given the patient's initial medical screening exam, the following diagnostic evaluation has been ordered. The patient will be placed in the appropriate treatment space, once one is available, to complete the evaluation and treatment. I have discussed the plan of care with the patient and I have advised the patient that an ED physician or mid-level practitioner will reevaluate their condition after the test results have been received, as the results may give them additional insight into the type of treatment they may need.    Diagnostics: Labs  Treatments: none immediately   Teodoro Spray, [place] 08/02/22 1349

## 2022-08-02 NOTE — ED Provider Notes (Signed)
[facility] Provider Note    Event Date/Time   First MD Initiated Contact with Patient 08/02/22 1424     (approximate)   History   Abnormal Lab   HPI  Lindsey [name] is a 66 y.o. female with a history of a CVA on Coumadin sent in by facility for elevated INR.  No reports of bleeding.  Patient has no physical complaints at this time.  Apparently Coumadin has been held for 3 days and today was checked and was found to be 8.     Physical Exam   Triage Vital Signs: ED Triage Vitals [08/02/22 1344]  Enc Vitals Group     BP 120/88     Pulse Rate 81     Resp 16     Temp 98.2 F (36.8 C)     Temp Source Oral     SpO2 100 %     Weight 63.5 kg (140 lb)     Height 1.651 m (5\' 5" )     Head Circumference      Peak Flow      Pain Score      Pain Loc      Pain Edu?      Excl. in [place]?     Most recent vital signs: Vitals:   08/02/22 1344  BP: 120/88  Pulse: 81  Resp: 16  Temp: 98.2 F (36.8 C)  SpO2: 100%     General: Awake, no distress.  CV:  Good peripheral perfusion.  Resp:  Normal effort.  Abd:  No distention.  Other:     ED Results / Procedures / Treatments   Labs (all labs ordered are listed, but only abnormal results are displayed) Labs Reviewed  CBC WITH DIFFERENTIAL/PLATELET - Abnormal; Notable for the following components:      Result Value   RBC 3.70 (*)    Hemoglobin 10.3 (*)    HCT 30.3 (*)    RDW 16.4 (*)    All other components within normal limits  COMPREHENSIVE METABOLIC PANEL - Abnormal; Notable for the following components:   Chloride 114 (*)    CO2 19 (*)    Creatinine, Ser 1.05 (*)    Albumin 3.3 (*)    Total Bilirubin 0.2 (*)    GFR, Estimated 59 (*)    All other components within normal limits  PROTIME-INR - Abnormal; Notable for the following components:   Prothrombin Time 44.2 (*)    INR 4.7 (*)    All other components within normal limits  APTT - Abnormal; Notable for the following components:   aPTT  60 (*)    All other components within normal limits     EKG     RADIOLOGY     PROCEDURES:  Critical Care performed:   Procedures   MEDICATIONS ORDERED IN ED: Medications - No data to display   IMPRESSION / MDM / [place] / ED COURSE  I reviewed the triage vital signs and the nursing notes. Patient's presentation is most consistent with exacerbation of chronic illness.   Patient sent in for elevated INR of 8 secondary to Coumadin.  Coumadin has been held but has not returned to therapeutic levels.  We will recheck INR here, if between 5 and 10 will give 2.5 mg of p.o. vitamin K.  Patient has no bleeding   Patient's INR returned at 4.7, she has no bleeding, no indication for p.o. vitamin K at this time.  FINAL CLINICAL IMPRESSION(S) / ED DIAGNOSES   Final diagnoses:  Supratherapeutic INR     Rx / DC Orders   ED Discharge Orders     None        Note:  This document was prepared using Dragon voice recognition software and may include unintentional dictation errors.   Lavonia Drafts, MD 08/03/22 [phone]

## 2022-08-02 NOTE — Discharge Instructions (Addendum)
Your INR was 4.7 today.  Please let your facility know of this.  You should hold the warfarin until it is back down to the normal range.  You do not need any extra medication to reverse the warfarin today.  Return to the emergency department if you develop any bleeding.

## 2022-08-02 NOTE — ED Triage Notes (Signed)
Ems report : pt ems from [facility] fpr elevated INR (8). Pt on coumadin and it has been held x 3 days. Pt s/p stroke with right hemiplegia and dysphagia. Daughter with pt.

## 2022-08-03 ENCOUNTER — Other Ambulatory Visit: Payer: Self-pay | Admitting: Physical Medicine and Rehabilitation

## 2022-08-04 ENCOUNTER — Ambulatory Visit: Payer: Medicare Other | Attending: Physical Medicine and Rehabilitation | Admitting: Occupational Therapy

## 2022-08-04 ENCOUNTER — Ambulatory Visit: Payer: Medicare Other

## 2022-08-04 ENCOUNTER — Encounter: Payer: Medicare Other | Admitting: Speech Pathology

## 2022-08-04 DIAGNOSIS — M6281 Muscle weakness (generalized): Secondary | ICD-10-CM

## 2022-08-04 DIAGNOSIS — R269 Unspecified abnormalities of gait and mobility: Secondary | ICD-10-CM | POA: Diagnosis present

## 2022-08-04 DIAGNOSIS — R2681 Unsteadiness on feet: Secondary | ICD-10-CM | POA: Insufficient documentation

## 2022-08-04 DIAGNOSIS — I63542 Cerebral infarction due to unspecified occlusion or stenosis of left cerebellar artery: Secondary | ICD-10-CM | POA: Insufficient documentation

## 2022-08-04 DIAGNOSIS — R262 Difficulty in walking, not elsewhere classified: Secondary | ICD-10-CM

## 2022-08-04 DIAGNOSIS — R278 Other lack of coordination: Secondary | ICD-10-CM | POA: Insufficient documentation

## 2022-08-04 DIAGNOSIS — I63512 Cerebral infarction due to unspecified occlusion or stenosis of left middle cerebral artery: Secondary | ICD-10-CM

## 2022-08-04 DIAGNOSIS — R2689 Other abnormalities of gait and mobility: Secondary | ICD-10-CM

## 2022-08-04 NOTE — Therapy (Signed)
OCCUPATIONAL THERAPY TREATMENT NOTE   Patient Name: Lindsey [name] MRN: [id number] DOB:April 27, 1956, 66 y.o., female Today's Date: 08/04/2022   REFERRING PROVIDER: Tomasa Hose, MD   OT End of Session - 08/04/22 1650     Visit Number 72    Number of Visits 83    Date for OT Re-Evaluation 09/20/22    Authorization Type Progress report period starting 02/17/22    OT Start Time 1020    OT Stop Time 1100    OT Time Calculation (min) 40 min    Activity Tolerance Patient tolerated treatment well    Behavior During Therapy WFL for tasks assessed/performed               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 ([place]) 09/24/2021   Cerebral embolism with cerebral infarction 09/23/2021   Pyelonephritis 09/19/2021   Pain due to onychomycosis of toenails of both feet 11/19/2020   Normocytic anemia 05/31/2020   Acute ischemic left MCA stroke ([place]) 04/30/2020   Right hemiplegia ([place]) 04/30/2020   Cerebrovascular accident ([place]) 04/21/2020   Atrial fibrillation with RVR ([place]) 04/21/2020   Essential hypertension 04/21/2020   Alcohol abuse 04/21/2020    RUE Measurements (Taken 04/04/22):   PROM Shoulder flexion:  7/03: 0(108)  5/18:  0(105)  PROM Shoulder abduction: 7/03: [id number])  5/18: 0(102)  PROM Elbow:   06/28/2022: 0(0-140) 7/03: 0(0-140)  5/18: 0(0-136)  AROM Wrist extension:  06/28/2022: -18/(72) 7/03: -28(72)  5/18: -40(72)  REFERRING DIAG: CVA  THERAPY DIAG:  Muscle weakness (generalized)  Rationale for Evaluation and Treatment Rehabilitation  PERTINENT HISTORY: Pt. is a 66 y.o. 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 Pt. has Right sided weakness, and receptive, and expressive aphasia. Pt. has had a previous CVA with right sided weakness in July 2021. PMHx includes: AFib, Renal Artery thrombosis, situational depression, HTN, Pyelnephritis, Seizure activity, COnstipation, AKI. Vitamin D deficiency, and urinary incontinence/  PRECAUTIONS: Fall   SUBJECTIVE:  Pt.'s  family reports that she has a long day of appointments.    PAIN:  Are you having pain? No   OBJECTIVE:   OT TREATMENT   Self-care:   Pt. worked on using a Secondary school teacher with the left hand to off her left shoe and sock with min assist, patient worked on donning her left sock with a sock aid with mod assist. Patient required mod assist to don her shoe max assist to tie her shoe.  Patient worked on negotiating the straps right AFO.   Manual Therapy:   Pt. tolerated soft tissue massage to the right scapular musculature. Pt. tolerated scapular mobilizations in elevation, depression,  abduction, and rotation following moist heat modality to the right shoulder. Manual therapy was performed independent of, and in preparation for there. Ex.   Therapeutic Exercise:   Pt. tolerated PROM through all joint ranges of the RUE, and hand following moist heat modality, and manual therapy. Pt. worked on reps of active right wrist extension, and right supination with support proximally. Pt. education was provided about opportunities to engage the RUE during daily tasks.    Pt. was able  to use the reacher to doff her shoe and sock with cues initially, however required extensive cues for using a sock aid to assist with left lower extremity dressing. Pt. tolerated ther. Ex, and manual therapy well, and continues to require increased assist for self-ROM especially at the elbow. Pt. continues to require increased verbal, and tactile cues for self-ROM technique to the right UE. Pt. Presents with no pain in the right shoulder during ROM today. Less tightness noted. Pt. continues to work on normalizing tone, and facilitating active volitional movement in the RUE, and hand to improve engagement of the RUE during ADLs, as well as improve transfers, and functional mobility for toileting.  PATIENT EDUCATION: Education details: dressing techniques, HEP Person educated: Patient Education method: Customer service manager Education comprehension: verbalized understanding, returned demonstration, verbal cues required, and tactile cues required   [place] with ongoing HEPs for ROM to the RUE, hand, and digits.   OT SHORT TERM GOAL #1     Title Pt and caregiver will be independent with HEP for RUE     Baseline Eval: No current program. 10th visit: Caregivers  report being comfortable with exercises at home. 01/06/2022: Continue ongoing HEPs for UE. 30th visit: Pt./caregivers to continue with ROM; 40th: pt/caregivers to continue with PROM throughout RUE 05/16/2022:  Continue ongoing pt./caregiver education about HEPs for the RUE.06/28/2022: Pt. Reports consistency has been limited. 07/14/2022: Consistency with UE ROM at home has been limited.    Time 6    Period Weeks     Status On-going     Target Date 08/09/2022                    OT Long Term Goals -                OT LONG TERM GOAL #1    Title Pt. will engage the RUE as a gross assist/atabilizer during ADLs 100% of the time     Baseline 07/14/2022: Pt. Continues to try to engage her RUE as a gross  stabilizer. 06/28/2022: Pt. continues to try to engage her RUE as a gross stabilizer. Continues to stabilize the loofa 05/16/2022: Pt. Is able to place items, in her right hand, and use her right hand  to stabilize various sizes of bottles while using her left hand to open them. 04/04/2022: Pt. Is using her right hand to hold the loofa, and stabilize items. 30th visit: Pt. is initiating using her RUE as a gross stabilizer with maxA 01/06/2022: Pt. is starting to engage her RUE as a gross stabiliizer with increased cues. Eval: pt. does not currently engage her RUE during ADLs. 10th visit: Pt. continues to need to work towards using her RUE as a gross stabilizer.     Time 12     Status On-going     Target Date 09/20/2022         OT LONG TERM GOAL #2    Title Pt. will perform UE dressing  using one armed dressing techniques     Baseline Eval: MaxA, 10th visit: [place] 01/06/2022: min-modA 30th visit; min0-modA, 04/04/2022: minA donning loose shirts, MaxA tighter items/sports bra, independent doffing a shirt and bra 05/16/2022: minA donning loose shirts, MaxA tighter items/sports bra, independent doffing a shirt and bra. 06/28/2022: minA donning, and doffing loose T-shirts. 07/14/2022: minA donning loose shirts, and maxA tighter fitting clothing.     Time 12     Period Weeks     Status On-going     Target Date 09/20/2022         OT LONG TERM GOAL #3    Title Pt. will require minA to stand and hike pants     Baseline Eval: MaxA, 10th visit: [place] 01/06/2022: [place] 30th visit: ModA 04/04/2022: independent with elastic pants if above the knees. MaxA if they fall below the knee; 04/07/22: min guard-min A to reach below knees to simulate hiking pants 05/16/2022: Min guard-MinA reaching below the knee to hike pants.06/28/2022: Min-CGA reaching to hike pants below the knees. 07/14/2022:    Time 12     Period Weeks     Status On-going     Target Date 09/30/2022         OT LONG TERM GOAL #4    Title Pt. will perform LE dressing  with minA     Baseline Eval: MaxA 10th visit: Pt. requires MaxA for donning her brace, shoes, and socks. ModA with pants. 01/06/2022: ModA pants, MaxA shoes, socks, and brace 30th visit: ModA pants, MaxA shoes, socks, and brace. 04/04/2022: MaxA; 04/07/22: pt can don/doff L sock and shoe with supv, dep for RLE; see above for hiking pants 05/16/2022:  Donning and doffing the Left sock and shoe with supervision. Total Assist for the RLE.06/28/2022: Supervision donning left shoe, and sock, Dep. For RLE. 07/14/2022: Supervision donning left shoe and  sock, and Dependent for RLE.    Time 12     Period Weeks     Status On-going     Target Date 09/20/22          OT LONG TERM GOAL #5    Title Pt. will improve FOTO score by 2 Lindsey Orozcos to reflect funcational change     Baseline Eval: FOTO score: 12, 10th visit: FOTO score 27 01/06/2022: 27 30th visit TBA at next visit secondary to no family available during the session; 04/04/22: FOTO 40 05/16/2022: 27 07/14/2022: 25    Time 12     Period Weeks     Status On-going     Target Date 09/20/22          OT LONG TERM GOAL #6    Title Patient will transfer to bedside commode or toilet with supervision.     Baseline 30th visit: min-modA. 01/06/2022: min-ModA 04/04/2022: Min-ModA 05/16/2022: min-modA 06/28/2022: min-ModA 07/14/2022: min-modA    Time 4     Period Weeks     Status On-going     Target Date 09/20/22         OT LONG TERM GOAL #7                                                      Title: Pt. Will improve AROM wrist extension by 5 degrees in preparation for improving functional  reaching.  Baseline:       7/03: -28(72) 06/28/2022: -18(72) 07/14/2022: -18(58)            Time: 4           Period: Weeks           Status: Ongoing           Target Date: 09/20/2022   Plan - 03/24/22 1429     Clinical Impression Statement Pt. was able to use the reacher to doff her shoe and sock with cues  initially, however required extensive cues for using a sock aid to assist with left lower extremity dressing. Pt. tolerated ther. Ex, and manual therapy well, and continues to require increased assist for self-ROM especially at the elbow. Pt. continues to require increased verbal, and tactile cues for self-ROM technique to the right UE. Pt. Presents with no pain in the right shoulder during ROM today. Less tightness noted. Pt. continues to work on normalizing tone, and facilitating active volitional movement in the RUE, and hand to improve engagement of the RUE during ADLs, as well as improve transfers, and functional mobility for toileting.               OT Occupational Profile and History Detailed Assessment- Review of Records and additional review of physical, cognitive, psychosocial history related to current functional performance    Occupational performance deficits (Please refer to evaluation for details): Leisure;IADL's;ADL's    Body Structure / Function / Physical Skills ADL;IADL;FMC;ROM;UE functional use;Strength;Decreased knowledge of use of DME;Coordination    Rehab Potential Fair    Clinical Decision Making Several treatment options, min-mod task modification necessary    Comorbidities Affecting Occupational Performance: May have comorbidities impacting occupational performance    Modification or Assistance to Complete Evaluation  Min-Moderate modification of tasks or assist with assess necessary to complete eval    OT Frequency 2x / week    OT Duration 12 weeks    OT Treatment/Interventions Self-care/ADL training;Neuromuscular education;Electrical Stimulation;Patient/family education;Therapeutic activities;Passive range of motion;Therapist, nutritional;Therapeutic exercise;DME and/or AE instruction;Moist Heat    Consulted and Agree with Plan of Care Patient             Harrel Carina, MS, OTR/L  Harrel Carina, OT 08/04/2022, 4:53 PM

## 2022-08-04 NOTE — Therapy (Signed)
OUTPATIENT PHYSICAL THERAPY TREATMENT NOTE/RECERTIFICATION  Patient Name: Lindsey [name] MRN: [id number] DOB:[birth year], 66 y.o., female Today's Date: 08/06/2022  PCP: Dr. Tomasa Hose MD REFERRING PROVIDER: Leeroy Cha MD   PT End of Session - 08/06/22 0940     Visit Number 55    Number of Visits 65    Date for PT Re-Evaluation 10/27/22    Authorization Type Medicare    Authorization Time Period Cert through 03/10/02; Recert 11/03/2246-11/08/35; Recert 0/01/8888-16/94; Recert 50/12/8880-8/00/3491    Progress Note Due on Visit 55    PT Start Time 1103    PT Stop Time 1144    PT Time Calculation (min) 41 min    Equipment Utilized During Treatment Gait belt    Activity Tolerance Patient tolerated treatment well    Behavior During Therapy WFL for tasks assessed/performed                       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 No pertinent surgical history. 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 ([place]) 09/24/2021   Cerebral embolism with cerebral infarction 09/23/2021   Pyelonephritis 09/19/2021   Pain due to onychomycosis of toenails of both feet 11/19/2020   Normocytic anemia 05/31/2020   Acute ischemic left MCA stroke ([place]) 04/30/2020   Right hemiplegia ([place]) 04/30/2020    Cerebrovascular accident ([place]) 04/21/2020   Atrial fibrillation with RVR ([place]) 04/21/2020   Essential hypertension 04/21/2020   Alcohol abuse 04/21/2020    REFERRING DIAG: s/p CVA  THERAPY DIAG:  Muscle weakness (generalized) - Plan: PT plan of care cert/re-cert  Acute ischemic left MCA stroke ([place]) - Plan: PT plan of care cert/re-cert  Unsteadiness on feet - Plan: PT plan of care cert/re-cert  Other lack of coordination - Plan: PT plan of care cert/re-cert  Abnormality of gait and mobility - Plan: PT plan of care cert/re-cert  Difficulty in walking, not elsewhere classified - Plan: PT plan of care cert/re-cert  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 At this point she is limited to wheelchair as her main means of mobility. Pt caregiver reports all detailed information. Pt has hesitation with the left lower extremity and significant weakness on the right lower extremity. Pt will take 2-3 steps when transfering. Pt caregiver assists with all transfers.Pt has shower with ledge to steps over ledge when entering and utilizes bedside commode. Pt has 2 level home with  chair lift for upstairs access. Pt would like to improve ransfers, improve neglect on her R side and improve her overall strength and mobility.Pt. is a 66 y.o. 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 Pt. has Right sided weakness, and receptive, and expressive aphasia. Pt. has had a previous CVA with right sided weakness in July 2021. PMHx includes: AFib, Renal Artery thrombosis, situational depression, HTN,  Pyelnephritis, Seizure activity, COnstipation, AKI. Vitamin D deficiency, and urinary incontinence.   PRECAUTIONS: fall risk, wearing RLE AFO  SUBJECTIVE:  Patient reports doing okay with no new major issues. States cold today as weather has really cooled off   PAIN:  Are you having pain? No      TODAY'S TREATMENT:  08/04/22    INTERVENTIONS:   Therapeutic Activity:   Sit to stand - focusing on technique- Patient required more VC today and less responsive requiring more verbal cues.  Ambulation with HW, CGA, gait belt x 1 trial 160 feet total: Focusing on sequencing with hemiwalker placement and gait cadence  Reassessed goals today for recert- Patient was much more fatigued and not as mobile as recent past visits.    PATIENT EDUCATION: Education details: Pt educated throughout session about proper posture and technique with exercises. Improved exercise technique, movement at target joints, use of target muscles after min to mod verbal, visual, tactile cues. Continued education on technique with STS.  Person educated: Patient Education method: Explanation, Demonstration, and Verbal cues Education comprehension: verbalized understanding, returned demonstration, and needs further education   HOME EXERCISE PROGRAM: Continue as previously given;    PT Short Term Goals -       PT SHORT TERM GOAL #1   Title Patient will be independent in home exercise program to improve strength/mobility for better functional independence with ADLs.    Baseline no formal HEp for LE/balance at this time; 02/08/2022=Patient continues to require VC for reminders and assist with hip march/knee ext (seated for strengthening); Reminders to continue to work on static stand at home.    Time 4    Period Weeks    Status Partially Met  04/07/22: complete somewhat regularly     Target Date 12/14/21              PT Long Term Goals -       PT LONG TERM GOAL #1   Title Patient will increase FOTO  score to equal to or greater than   41  to demonstrate statistically significant improvement in mobility and quality of life.    Baseline 16 on 2/14; 02/08/2022=Patient unable to complete today- No family present but will attempt again next visit available with family present to assist in completion; 02/17/2022= 40; 06/28/2022- No caregiver present in PT session to assist in answering questions. 08/04/2022= 40    Time 12    Period Weeks    Status On-going    Target Date 10/27/2022       PT LONG TERM GOAL #3   Title Patient  will complete five times sit to stand test in < 30 seconds with UE assistance indicating an increased LE strength and improved balance.    Baseline 58.47 s with UE assist and with A from PT for R LE positioning; 02/08/2022=54 sec with use of L UE support and Min physical assist to position right LE.  7/6: 30.5 sec, min A for LE positioning, some attempts did not come to full erect  posture, did not let go with L UE from armchair with any attempt 05/05/22: 26.65 sec   Time 12    Period Weeks    Status Met    Target Date 05/03/22      PT LONG TERM GOAL #4   Title Patient will increase Berg Balance score by > 6 points to demonstrate decreased fall risk during functional activities.    Baseline 12 on 2/14; 02/08/2022= 13/56  04/07/22: 17  8/3: 19    Time 12    Period Weeks    Status Met    Target Date 05/03/22      PT LONG TERM GOAL #5   Title Pt will transfer from seated position on table/chair to/from transport chair/WC in order to indicate improved transfer ability.    Baseline Requires Min A for LE positioning and to prevent LOB. 02/08/2022=Patient attempted to perform on her own- Unable to push herself up from chair- Very min assist to position right LE into more flexed position and VC for hand placement. Patient able to stand with min Assist with use of gait belt and VC to lean; 02/17/2022=Patient was able to transfer from transport w/c with some physical assist with Right to swing  to pivot to side of chair. She was able to sit to stand today with only CGA and then stand pivot with right arm holding onto arm of chair. Only difficulty was stand to sit- Max VC not to plop into chair. 04/07/22: Pt performed movement of B LE to side of chair independently with chair transfer. Pt required min A with R LE foot placement of floor. Pt requires increased time to complete but complete with CGA only for safety and no cues or A to prevent falling/ plopping into armchair.  8/3: Pt requires min A for maintenance of standig balance, able to navigate from transport chairr to arm chair with hemiwalker, does not controll descent with ahands still " flops" into chair; 08/04/2022- Patient continues to be inconsistent in her transfers- at times only CGA and other more min Assist and repetitive VC. Unclear due to some ongoing cognitive issues if patient will achieve this goal but still presents with potential.    Time 12    Period Weeks    Status Partially met    Target Date 10/27/2022       PT LONG TERM GOAL #6   Title Patient will ambulate > 150 feet with CGA using Hemiwalker on level surfaces for improved household and short community distances.    Baseline 02/08/2022= 40 feet with use of HW, heavy CGA with max VC for gait sequencing and w/c follow. 5/18 = 85 feet with use of HW, close CGA  7/6: 45 feet, heavy use of hemiwalker 8/3: 85 feet with heavy hemi walker use, no Lob throughout, cues for appropriate weight distribution; 06/28/2022 - 205 feet with use of hemiwalker, CGA; 08/04/2022- Patient presents with inconsistent gait ability - able to walk 150 feet with max effort and increased time using hemiwalker and CGA.    Time 12    Period Weeks    Status On-going    Target Date 10/27/2022        PT LONG TERM GOAL #7   Title Pt will increase 10MWT by at least 0.13 m/s in order to demonstrate clinically significant improvement in community ambulation.    Baseline 02/08/2022= 0.05 m/s using HW 7/6:  .030ms with heavy ue use of hemiwalker and CGA for balance 8/3: .0574m 06/28/2022= 0.1  m/s (will keep goal active to ensure she is consistent); 08/04/2022- 0.09 m/s)   Time 12    Period Weeks    Status Ongoing     Target Date 10/27/2022      8.  Pt will improve 5X STS with full knee extension in standing each rep and with appropriate eccentric control in order to indicate improved transfer efficacy and improved LE strength  Baseline: 52.45 sec with UE support; 06/28/2022= 36.03 sec with UE support (VC for erect standing- patient continues to press up using back of legs against chair to stand); 08/04/2022- Patient required 45 sec using left UE and max VC (flopping at times with sitting)  Goal status: ONGOING TARGET date: 10/27/2022  9.  Patient will increase Berg Balance score by > 6 points to demonstrate decreased fall risk during functional activities Baseline: 19; 06/28/2022= 19/56; 08/04/2022- Deferred secondary to patient too fatigued to attempt after walking today. Will reassess next visit. Goal status: ONGOING Target Date: 10/27/2022           Plan -     Clinical Impression Statement Patient presents back to PT since missing last week. She states she has been walking more at home but today presented with decreased mobility than most recent visits. She struggled more with standing and with walking and has been inconsistent this cert. She has potential to improve her safety but limited with cognition. Patient's condition has the potential to improve in response to therapy. Maximum improvement is yet to be obtained. The anticipated improvement is attainable and reasonable in a generally predictable time but if not improvement over this cert may be appropriate for future discharge.  Pt will continue to benefit from skilled physical therapy intervention to address impairments, improve QOL, and attain therapy goals.    Personal Factors and Comorbidities Age;Comorbidity 1;Comorbidity 2;Comorbidity  3+;Time since onset of injury/illness/exacerbation;Other    Comorbidities AFib, Renal Artery thrombosis, situational depression, HTN, Pyelnephritis, Seizure activity, COnstipation, AKI    Examination-Activity Limitations Bathing;Bed Mobility;Carry;Locomotion Level;Dressing;Sit;Squat;Stairs;Stand;Toileting;Transfers    Examination-Participation Restrictions Church;Community Activity;Laundry    Stability/Clinical Decision Making Evolving/Moderate complexity    Rehab Potential Fair    PT Frequency 2x / week    PT Duration 12 weeks    PT Treatment/Interventions ADLs/Self Care Home Management;Gait training;Stair training;Therapeutic activities;Therapeutic exercise;Balance training;Neuromuscular re-education;Patient/family education;Wheelchair mobility training;Manual techniques;Energy conservation;Dry needling;Passive range of motion;Taping;Spinal Manipulations;Joint Manipulations    PT Next Visit Plan Gait training, Transfer training, LE strengthening as appropriate.    PT Home Exercise Plan HEP: general LE and postural exercises    Consulted and Agree with Plan of Care Patient            Lewis Moccasin PT   08/06/22, 10:28 AM

## 2022-08-08 ENCOUNTER — Other Ambulatory Visit: Payer: Medicare Other

## 2022-08-09 ENCOUNTER — Ambulatory Visit: Payer: Medicare Other

## 2022-08-09 ENCOUNTER — Ambulatory Visit: Payer: Medicare Other | Admitting: Occupational Therapy

## 2022-08-09 ENCOUNTER — Encounter: Payer: Medicare Other | Admitting: Speech Pathology

## 2022-08-10 ENCOUNTER — Inpatient Hospital Stay: Payer: Medicare Other

## 2022-08-10 ENCOUNTER — Inpatient Hospital Stay: Payer: Medicare Other | Attending: Oncology | Admitting: Oncology

## 2022-08-10 ENCOUNTER — Encounter: Payer: Self-pay | Admitting: Oncology

## 2022-08-10 VITALS — BP 152/79 | HR 77 | Temp 97.7°F | Wt 140.0 lb

## 2022-08-10 DIAGNOSIS — D649 Anemia, unspecified: Secondary | ICD-10-CM | POA: Insufficient documentation

## 2022-08-10 DIAGNOSIS — I4891 Unspecified atrial fibrillation: Secondary | ICD-10-CM | POA: Diagnosis not present

## 2022-08-10 DIAGNOSIS — N280 Ischemia and infarction of kidney: Secondary | ICD-10-CM | POA: Insufficient documentation

## 2022-08-10 DIAGNOSIS — Z7901 Long term (current) use of anticoagulants: Secondary | ICD-10-CM | POA: Insufficient documentation

## 2022-08-10 LAB — IRON AND TIBC
Iron: 43 ug/dL (ref 28–170)
Saturation Ratios: 16 % (ref 10.4–31.8)
TIBC: 272 ug/dL (ref 250–450)
UIBC: 229 ug/dL

## 2022-08-10 LAB — FERRITIN: Ferritin: 262 ng/mL (ref 11–307)

## 2022-08-10 NOTE — Assessment & Plan Note (Signed)
#  renal artery thrombosis and recurrent stroke 01/28/2022 repeat lupus anticoagulant was negative.  Patient does not have antiphospholipid syndrome. Negative prothrombin gene mutation, negative factor V Leiden mutation.  ANCA is negative. Clinically he is doing very well on Coumadin.  Recommend patient to continue Coumadin, goal of INR 2-3 continue follow-up with primary care provider. Other options include DOACs.

## 2022-08-10 NOTE — Assessment & Plan Note (Signed)
Check iron panel - WNL.  Likely anemia of chronic disease. Observation.

## 2022-08-10 NOTE — Progress Notes (Signed)
Hematology/Oncology Progress note [phone] [phone]         Patient Care Team: Donnie Coffin, MD as PCP - General (Family Medicine) Rico Junker, RN as Registered Nurse Theodore Demark, RN (Inactive) as Registered Nurse Earlie Server, MD as Consulting Physician (Hematology and Oncology)  REFERRING PROVIDER: Donnie Coffin, MD  CHIEF COMPLAINTS/REASON FOR VISIT:   renal artery thrombosis  HISTORY OF PRESENTING ILLNESS:   Lindsey [name] is a  66 y.o.  female with PMH listed below was seen in consultation at the request of  Donnie Coffin, MD  for evaluation of renal artery thrombosis.  Patient initially presented to emergency room for evaluation of nausea, vomiting, diaphoresis, change of mental status.  Her initial evaluation included CT scan of abdomen pelvis which revealed multifocal pyelonephritis of the right kidney.  Patient was treated with intravenous antibiotics.  At baseline, patient has no history of prior left MCA stroke and right hemiplegia and aphasia.  Patient also has history of atrial fibrillation along has been on Eliquis 5 mg twice daily. 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  There was also concern about seizure activities due to decreased responsiveness and neurology started patient on Keppra.  Patient later was transitioned to Levaquin and discharged to inpatient rehab on 09/24/2021.  There he developed leukocytosis, tachycardia and a fever again.  09/26/2021 repeat CT scan of the abdomen pelvis showed interim development of left renal artery thrombus.  Patient was transferred back to acute care internal medicine.  Antibodies for antiphospholipid syndrome were collected while patient was on heparin. 09/28/2021  lupus anticoagulant positive Anticardiolipin IgG, IgM negative, IgA 20 Beta-2 glycoprotein IgG,  IgM, IgA negative  01/28/2022 repeat lupus anticoagulant was negative.  Patient is on chronic anticoagulation with Coumadin with INR goal 2-3.   INTERVAL HISTORY Lindsey [name] is a 66 y.o. female who has above history reviewed by me today presents for follow up visit for renal artery thrombosis/CVA Patient is accompanied by her daughter.  She is currently on anticoagulation with Coumadin. Coumadin dosing managed by PCP with goal of 2-3 Denies any acute bleeding.  Patient has no new complaints today.  Review of Systems  Unable to perform ROS: Other (Aphasia)  Constitutional:  Negative for appetite change.  Respiratory:  Negative for shortness of breath.   Cardiovascular:  Negative for chest pain.  Gastrointestinal:  Negative for abdominal pain.  Musculoskeletal:  Negative for arthralgias.  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 No pertinent surgical history.  SOCIAL HISTORY: Social History   Socioeconomic History   Marital status: Single    Spouse name: Not on file   Number of children: Not on file   Years of education: Not on file   Highest education level: Not on file  Occupational History   Not on file  Tobacco Use  Smoking status: Never    Passive exposure: Never   Smokeless tobacco: Never  Vaping Use   Vaping Use: Never used  Substance and Sexual Activity   Alcohol use: Not Currently    Comment: states once a wine/beer occassionally    Drug use: No   Sexual activity: Not Currently  Other Topics Concern   Not on file  Social History  Narrative   Not on file   Social Determinants of Health   Financial Resource Strain: Not on file  Food Insecurity: No Food Insecurity (07/11/2022)   Hunger Vital Sign    Worried About Running Out of Food in the Last Year: Never true    Ran Out of Food in the Last Year: Never true  Transportation Needs: No Transportation Needs (07/11/2022)   PRAPARE - Hydrologist (Medical): No    Lack of Transportation (Non-Medical): No  Physical Activity: Not on file  Stress: Not on file  Social Connections: Not on file  Intimate Partner Violence: Not on file    FAMILY HISTORY: Family History  Problem Relation Age of Onset   Breast cancer Neg Hx     ALLERGIES:  has No Known Allergies.  MEDICATIONS:  Current Outpatient Medications  Medication Sig Dispense Refill   acetaminophen (TYLENOL) 325 MG tablet Take 1-2 tablets (325-650 mg total) by mouth every 4 (four) hours as needed for mild pain.     aspirin 81 MG chewable tablet daily.     atorvastatin (LIPITOR) 20 MG tablet Take 1 tablet (20 mg total) by mouth daily at 6 PM. 90 tablet 3   B Complex-C (B COMPLEX-VITAMIN C) CAPS daily.     baclofen (LIORESAL) 10 MG tablet Take by mouth.     Baclofen 5 MG TABS TAKE 1 TABLET BY MOUTH AT BEDTIME 60 tablet 2   diltiazem (TIAZAC) 180 MG 24 hr capsule Take 180 mg by mouth daily.     gabapentin (NEURONTIN) 100 MG capsule Take by mouth.     levETIRAcetam (KEPPRA) 500 MG tablet TAKE 1 TABLET BY MOUTH EVERY 12 HOURS. 180 tablet 2   megestrol (MEGACE) 400 MG/10ML suspension Take 10 mLs (400 mg total) by mouth daily. 240 mL 0   metoprolol succinate (TOPROL-XL) 50 MG 24 hr tablet Take 50 mg by mouth 2 (two) times daily.     Multiple Vitamins-Minerals (MULTIVITAMIN ADULTS 50+) TABS Take 1 tablet by mouth daily.     sertraline (ZOLOFT) 25 MG tablet TAKE 1 TABLET BY MOUTH AT BEDTIME 90 tablet 2   Vitamin D, Ergocalciferol, (DRISDOL) 1.25 MG (50000 UNIT) CAPS capsule Take 1 capsule  (50,000 Units total) by mouth every 7 (seven) days. Each Tuesday 12 capsule 0   warfarin (COUMADIN) 2.5 MG tablet TAKE 1 TABLET BY MOUTH ONCE DAILY AT 4:00 P.M. 90 tablet 2   valsartan (DIOVAN) 40 MG tablet Take 1 tablet (40 mg total) by mouth daily. (Patient not taking: Reported on 08/10/2022) 90 tablet 3   No current facility-administered medications for this visit.     PHYSICAL EXAMINATION: ECOG PERFORMANCE STATUS: 3 - Symptomatic, >50% confined to bed Vitals:   08/10/22 1310  BP: (!) 152/79  Pulse: 77  Temp: 97.7 F (36.5 C)  SpO2: 100%   Filed Weights   08/10/22 1310  Weight: 140 lb (63.5 kg)    Physical Exam Constitutional:      General: She is not in acute distress.    Comments: Patient sits in the wheelchair  HENT:     Head: Normocephalic and atraumatic.  Eyes:     General: No scleral icterus. Cardiovascular:     Rate and Rhythm: Normal rate.     Heart sounds: Normal heart sounds.  Pulmonary:     Effort: Pulmonary effort is normal. No respiratory distress.     Breath sounds: No wheezing.  Abdominal:     General: Bowel sounds are normal. There is no distension.     Palpations: Abdomen is soft.  Musculoskeletal:        General: Normal range of motion.     Cervical back: Normal range of motion and neck supple.     Right lower leg: No edema.  Skin:    General: Skin is warm and dry.     Findings: No erythema or rash.  Neurological:     Mental Status: She is alert. Mental status is at baseline.     Comments: Aphasia  Right hemiplegia     LABORATORY DATA:  I have reviewed the data as listed Lab Results  Component Value Date   WBC 4.6 08/02/2022   HGB 10.3 (L) 08/02/2022   HCT 30.3 (L) 08/02/2022   MCV 81.9 08/02/2022   PLT 341 08/02/2022   Recent Labs    10/02/21 0518 10/06/21 0042 10/20/21 0537 01/28/22 1356 08/02/22 1414  NA 133*   < > 136 137 141  K 3.7   < > 3.8 3.9 3.7  CL 99   < > 104 103 114*  CO2 22   < > 22 26 19*  GLUCOSE 107*   < >  103* 105* 89  BUN 11   < > 8 12 15   CREATININE 1.29*   < > 0.98 0.92 1.05*  CALCIUM 8.7*   < > 8.7* 9.3 9.2  GFRNONAA 46*   < > >60 >60 59*  PROT 7.6  --   --  8.6* 7.8  ALBUMIN 2.3*  --   --  3.8 3.3*  AST 69*  --   --  35 24  ALT 20  --   --  22 21  ALKPHOS 84  --   --  79 58  BILITOT 0.7  --   --  1.0 0.2*   < > = values in this interval not displayed.    Iron/TIBC/Ferritin/ %Sat    Component Value Date/Time   IRON 43 08/10/2022 1412   TIBC 272 08/10/2022 1412   FERRITIN 262 08/10/2022 1412   IRONPCTSAT 16 08/10/2022 1412      RADIOGRAPHIC STUDIES: I have personally reviewed the radiological images as listed and agreed with the findings in the report. No results found.

## 2022-08-11 ENCOUNTER — Encounter: Payer: Medicare Other | Admitting: Speech Pathology

## 2022-08-11 ENCOUNTER — Ambulatory Visit: Payer: Medicare Other | Admitting: Occupational Therapy

## 2022-08-11 ENCOUNTER — Ambulatory Visit: Payer: Medicare Other

## 2022-08-11 DIAGNOSIS — R269 Unspecified abnormalities of gait and mobility: Secondary | ICD-10-CM

## 2022-08-11 DIAGNOSIS — R262 Difficulty in walking, not elsewhere classified: Secondary | ICD-10-CM

## 2022-08-11 DIAGNOSIS — M6281 Muscle weakness (generalized): Secondary | ICD-10-CM

## 2022-08-11 DIAGNOSIS — I63542 Cerebral infarction due to unspecified occlusion or stenosis of left cerebellar artery: Secondary | ICD-10-CM

## 2022-08-11 DIAGNOSIS — R2689 Other abnormalities of gait and mobility: Secondary | ICD-10-CM

## 2022-08-11 DIAGNOSIS — I63512 Cerebral infarction due to unspecified occlusion or stenosis of left middle cerebral artery: Secondary | ICD-10-CM

## 2022-08-11 DIAGNOSIS — R278 Other lack of coordination: Secondary | ICD-10-CM

## 2022-08-11 DIAGNOSIS — R2681 Unsteadiness on feet: Secondary | ICD-10-CM

## 2022-08-11 NOTE — Therapy (Signed)
OUTPATIENT PHYSICAL THERAPY TREATMENT NOTE  Patient Name: Lindsey [name] MRN: [id number] DOB:06-14-1956, 66 y.o., female Today's Date: 08/11/2022  PCP: Dr. Tomasa Hose MD REFERRING PROVIDER: Leeroy Cha MD   PT End of Session - 08/11/22 1108     Visit Number 56    Number of Visits 67    Date for PT Re-Evaluation 10/27/22    Authorization Type Medicare    Authorization Time Period Cert through 01/07/95; Recert 11/11/5282-10/05/2438; Recert 1/0/2725-36/64; Recert 40/12/4740-5/95/6387    Progress Note Due on Visit 23    PT Start Time 1100    PT Stop Time 1143    PT Time Calculation (min) 43 min    Equipment Utilized During Treatment Gait belt    Activity Tolerance Patient tolerated treatment well    Behavior During Therapy WFL for tasks assessed/performed                       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 No pertinent surgical history. 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 ([place]) 09/24/2021   Cerebral embolism with cerebral infarction 09/23/2021   Pyelonephritis 09/19/2021   Pain due to onychomycosis of toenails of both feet 11/19/2020   Normocytic anemia 05/31/2020   Acute ischemic left MCA stroke ([place]) 04/30/2020   Right hemiplegia ([place]) 04/30/2020    Cerebrovascular accident ([place]) 04/21/2020   Atrial fibrillation with RVR ([place]) 04/21/2020   Essential hypertension 04/21/2020   Alcohol abuse 04/21/2020    REFERRING DIAG: s/p CVA  THERAPY DIAG:  Muscle weakness (generalized)  Acute ischemic left MCA stroke (HCC)  Unsteadiness on feet  Other lack of coordination  Abnormality of gait and mobility  Difficulty in walking, not elsewhere classified  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 At this point she is limited to wheelchair as her main means of mobility. Pt caregiver reports all detailed information. Pt has hesitation with the left lower extremity and significant weakness on the right lower extremity. Pt will take 2-3 steps when transfering. Pt caregiver assists with all transfers.Pt has shower with ledge to steps over ledge when entering and utilizes bedside commode. Pt has 2 level home with chair lift for upstairs access. Pt would like to improve ransfers, improve neglect on her R side and improve her overall strength and mobility.Pt. is a 66 y.o. 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 Pt. has Right sided weakness, and receptive, and expressive aphasia.  Pt. has had a previous CVA with right sided weakness in July 2021. PMHx includes: AFib, Renal Artery thrombosis, situational depression, HTN, Pyelnephritis, Seizure activity, COnstipation, AKI. Vitamin D deficiency, and urinary incontinence.   PRECAUTIONS: fall risk, wearing RLE AFO  SUBJECTIVE:  Patient reports feeling stiff today. No other new complaints   PAIN:  Are you having pain? No      TODAY'S TREATMENT:   08/04/22    INTERVENTIONS:   THEREX:  PROM to right knee into flex/ext Self stretch to right Ham- hold 3 min with leg propped on stool Self strech with right knee flex as much as possible- hold 2 min  Pyramid strengthening for R quad performing seated LAQ: (assist to push leg back due to weak hamstrings for more ROM/Power of quads)  12 reps- 0#  10 reps - 1.5# AW 8 reps- 2.5 # AW 6 reps- 3# AW then continued for 4 more-10 reps total at 3#  Left knee LAQ- 4 sets of 12  reps with 3# AW   Ham curl (seated) pyramid with theraband each LE Yellow- 1 set of 12 reps Red - 1 sets of 10 reps Green - 1 set of 8 reps Blue- 1 set of 6 reps (VC to pull her right foot as far back as possible)    Ambulation with HW, CGA, gait belt x 1 trial 120 feet total: Focusing on gait sequencing with hemiwalker placement and gait cadence. Patient was able to coordinate better with hemi walker.    PATIENT EDUCATION: Education details: Pt educated throughout session about proper posture and technique with exercises. Improved exercise technique, movement at target joints, use of target muscles after min to mod verbal, visual, tactile cues. Continued education on technique with STS.  Person educated: Patient Education method: Explanation, Demonstration, and Verbal cues Education comprehension: verbalized understanding, returned demonstration, and needs further education   HOME EXERCISE PROGRAM: Continue as previously given;    PT Short Term Goals -       PT SHORT TERM GOAL #1   Title Patient will be independent in home exercise program to improve strength/mobility for better functional independence with ADLs.    Baseline no formal HEp for LE/balance at this time; 02/08/2022=Patient continues to require VC for reminders and assist with hip march/knee ext (seated for strengthening); Reminders to continue to work on static stand at home.    Time 4    Period Weeks    Status Partially Met  04/07/22: complete  somewhat regularly     Target Date 12/14/21              PT Long Term Goals -       PT LONG TERM GOAL #1   Title Patient will increase FOTO score to equal to or greater than   41  to demonstrate statistically significant improvement in mobility and quality of life.    Baseline 16 on 2/14; 02/08/2022=Patient unable to complete today- No family present but will attempt again next visit available with family present to assist in completion; 02/17/2022= 40; 06/28/2022- No caregiver present in PT session to assist in answering questions. 08/04/2022= 40    Time 12    Period Weeks    Status On-going    Target Date 10/27/2022       PT LONG TERM GOAL #3   Title Patient  will complete five times sit to stand test in < 30 seconds with UE assistance indicating an increased LE strength and improved balance.  Baseline 58.47 s with UE assist and with A from PT for R LE positioning; 02/08/2022=54 sec with use of L UE support and Min physical assist to position right LE.  7/6: 30.5 sec, min A for LE positioning, some attempts did not come to full erect posture, did not let go with L UE from armchair with any attempt 05/05/22: 26.65 sec   Time 12    Period Weeks    Status Met    Target Date 05/03/22      PT LONG TERM GOAL #4   Title Patient will increase Berg Balance score by > 6 points to demonstrate decreased fall risk during functional activities.    Baseline 12 on 2/14; 02/08/2022= 13/56  04/07/22: 17  8/3: 19    Time 12    Period Weeks    Status Met    Target Date 05/03/22      PT LONG TERM GOAL #5   Title Pt will transfer from seated position on table/chair to/from transport chair/WC in order to indicate improved transfer ability.    Baseline Requires Min A for LE positioning and to prevent LOB. 02/08/2022=Patient attempted to perform on her own- Unable to push herself up from chair- Very min assist to position right LE into more flexed position and VC for hand placement. Patient able to stand with  min Assist with use of gait belt and VC to lean; 02/17/2022=Patient was able to transfer from transport w/c with some physical assist with Right to swing to pivot to side of chair. She was able to sit to stand today with only CGA and then stand pivot with right arm holding onto arm of chair. Only difficulty was stand to sit- Max VC not to plop into chair. 04/07/22: Pt performed movement of B LE to side of chair independently with chair transfer. Pt required min A with R LE foot placement of floor. Pt requires increased time to complete but complete with CGA only for safety and no cues or A to prevent falling/ plopping into armchair.  8/3: Pt requires min A for maintenance of standig balance, able to navigate from transport chairr to arm chair with hemiwalker, does not controll descent with ahands still " flops" into chair; 08/04/2022- Patient continues to be inconsistent in her transfers- at times only CGA and other more min Assist and repetitive VC. Unclear due to some ongoing cognitive issues if patient will achieve this goal but still presents with potential.    Time 12    Period Weeks    Status Partially met    Target Date 10/27/2022       PT LONG TERM GOAL #6   Title Patient will ambulate > 150 feet with CGA using Hemiwalker on level surfaces for improved household and short community distances.    Baseline 02/08/2022= 40 feet with use of HW, heavy CGA with max VC for gait sequencing and w/c follow. 5/18 = 85 feet with use of HW, close CGA  7/6: 45 feet, heavy use of hemiwalker 8/3: 85 feet with heavy hemi walker use, no Lob throughout, cues for appropriate weight distribution; 06/28/2022 - 205 feet with use of hemiwalker, CGA; 08/04/2022- Patient presents with inconsistent gait ability - able to walk 150 feet with max effort and increased time using hemiwalker and CGA.    Time 12    Period Weeks    Status On-going    Target Date 10/27/2022        PT LONG TERM GOAL #7  Title Pt will increase 10MWT  by at least 0.13 m/s in order to demonstrate clinically significant improvement in community ambulation.    Baseline 02/08/2022= 0.05 m/s using HW 7/6: .051ms with heavy ue use of hemiwalker and CGA for balance 8/3: .052m 06/28/2022= 0.1 m/s (will keep goal active to ensure she is consistent); 08/04/2022- 0.09 m/s)   Time 12    Period Weeks    Status Ongoing     Target Date 10/27/2022      8.  Pt will improve 5X STS with full knee extension in standing each rep and with appropriate eccentric control in order to indicate improved transfer efficacy and improved LE strength  Baseline: 52.45 sec with UE support; 06/28/2022= 36.03 sec with UE support (VC for erect standing- patient continues to press up using back of legs against chair to stand); 08/04/2022- Patient required 45 sec using left UE and max VC (flopping at times with sitting)  Goal status: ONGOING TARGET date: 10/27/2022  9.  Patient will increase Berg Balance score by > 6 points to demonstrate decreased fall risk during functional activities Baseline: 19; 06/28/2022= 19/56; 08/04/2022- Deferred secondary to patient too fatigued to attempt after walking today. Will reassess next visit. Goal status: ONGOING Target Date: 10/27/2022           Plan -     Clinical Impression Statement Patient presents with good motivation for today's session. She performed better with dynamic warm up with right knee flex/ext. She was able to demo improved gait sequencing vs. Some previous visits- just fatigued from LE strengthening.   Pt will continue to benefit from skilled physical therapy intervention to address impairments, improve QOL, and attain therapy goals.    Personal Factors and Comorbidities Age;Comorbidity 1;Comorbidity 2;Comorbidity 3+;Time since onset of injury/illness/exacerbation;Other    Comorbidities AFib, Renal Artery thrombosis, situational depression, HTN, Pyelnephritis, Seizure activity, COnstipation, AKI    Examination-Activity  Limitations Bathing;Bed Mobility;Carry;Locomotion Level;Dressing;Sit;Squat;Stairs;Stand;Toileting;Transfers    Examination-Participation Restrictions Church;Community Activity;Laundry    Stability/Clinical Decision Making Evolving/Moderate complexity    Rehab Potential Fair    PT Frequency 2x / week    PT Duration 12 weeks    PT Treatment/Interventions ADLs/Self Care Home Management;Gait training;Stair training;Therapeutic activities;Therapeutic exercise;Balance training;Neuromuscular re-education;Patient/family education;Wheelchair mobility training;Manual techniques;Energy conservation;Dry needling;Passive range of motion;Taping;Spinal Manipulations;Joint Manipulations    PT Next Visit Plan Gait training, Transfer training, LE strengthening as appropriate.    PT Home Exercise Plan HEP: general LE and postural exercises    Consulted and Agree with Plan of Care Patient            JeLewis MoccasinT   08/11/22, 12:53 PM

## 2022-08-11 NOTE — Therapy (Signed)
OCCUPATIONAL THERAPY TREATMENT NOTE   Patient Name: Lindsey [name] MRN: [id number] DOB:06-25-1956, 66 y.o., female Today's Date: 08/11/2022   REFERRING PROVIDER: Tomasa Hose, MD   OT End of Session - 08/11/22 1339     Visit Number 6    Number of Visits 65    Date for OT Re-Evaluation 09/20/22    Authorization Type Progress report period starting 02/17/22    OT Start Time 1015    OT Stop Time 1100    OT Time Calculation (min) 45 min    Activity Tolerance Patient tolerated treatment well    Behavior During Therapy WFL for tasks assessed/performed               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 ([place]) 09/24/2021   Cerebral embolism with cerebral infarction 09/23/2021   Pyelonephritis 09/19/2021   Pain due to onychomycosis of toenails of both feet 11/19/2020   Normocytic anemia 05/31/2020   Acute ischemic left MCA stroke ([place]) 04/30/2020   Right hemiplegia ([place]) 04/30/2020   Cerebrovascular accident ([place]) 04/21/2020   Atrial fibrillation with RVR ([place]) 04/21/2020   Essential hypertension 04/21/2020   Alcohol abuse 04/21/2020    RUE Measurements (Taken 04/04/22):   PROM Shoulder flexion:  7/03: 0(108)  5/18:  0(105)  PROM Shoulder abduction: 7/03: [id number])  5/18: 0(102)  PROM Elbow:   06/28/2022: 0(0-140) 7/03: 0(0-140)  5/18: 0(0-136)  AROM Wrist extension:  06/28/2022: -18/(72) 7/03: -28(72)  5/18: -40(72)  REFERRING DIAG: CVA  THERAPY DIAG:  Muscle weakness (generalized)  Rationale for Evaluation and Treatment Rehabilitation  PERTINENT HISTORY: Pt. is a 66 y.o. 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 Pt. has Right sided weakness, and receptive, and expressive aphasia. Pt. has had a previous CVA with right sided weakness in July 2021. PMHx includes: AFib, Renal Artery thrombosis, situational depression, HTN, Pyelnephritis, Seizure activity, COnstipation, AKI. Vitamin D deficiency, and urinary incontinence/  PRECAUTIONS: Fall   SUBJECTIVE:  Pt.'s  family reports that she has a long day of appointments.    PAIN:  Are you having pain? No   OBJECTIVE:   OT TREATMENT  Manual Therapy:   Pt. tolerated soft tissue massage to the right scapular musculature. Pt. tolerated scapular mobilizations in elevation, depression, abduction, and rotation following moist heat modality to the right shoulder. Manual therapy was performed independent of, and in preparation for there. Ex. Pt. education was provided about soft tissue massage to the volar, and dorsal aspect of the forearm.    Therapeutic Exercise:   Pt. tolerated PROM through all joint ranges of the RUE, and hand following moist heat modality,  and manual therapy. Pt. worked on reps of active right wrist extension, and right supination with support proximally. Pt. education was provided about opportunities to engage the RUE during daily tasks, and IADL tasks.    Pt. presents with 2/10 pain in the RUE upon arrival, using the Wong-Baker Facial Grimace Scale. Pt. Continues to require cues for soft tissue massage to the volar, dorsal surface of the forearm. Pt. Continues  to present with increased tone, and tightness in the RUE. Pt. continues to work on normalizing tone, and facilitating active volitional movement in the RUE, and hand to improve engagement of the RUE during ADLs, as well as improve transfers, and functional mobility for toileting.    PATIENT EDUCATION: Education details: dressing techniques, HEP Person educated: Patient Education method: Medical illustrator Education comprehension: verbalized understanding, returned demonstration, verbal cues required, and tactile cues required   HOME EXERCISE PROGRAM   Continue with ongoing HEPs for ROM to the RUE, hand, and digits.   OT SHORT TERM GOAL #1     Title Pt and caregiver will be independent with HEP for RUE     Baseline Eval: No current program. 10th visit: Caregivers  report being comfortable with exercises at home. 01/06/2022: Continue ongoing HEPs for UE. 30th visit: Pt./caregivers to continue with ROM; 40th: pt/caregivers to continue with PROM throughout RUE 05/16/2022:  Continue ongoing pt./caregiver education about HEPs for the RUE.06/28/2022: Pt. Reports consistency has been limited. 07/14/2022: Consistency with UE ROM at home has been limited.    Time 6    Period Weeks     Status On-going     Target Date 08/09/2022                    OT Long Term Goals -                OT LONG TERM GOAL #1    Title Pt. will engage the RUE as a gross assist/atabilizer during ADLs 100% of the time     Baseline 07/14/2022: Pt. Continues to try to engage her RUE as a gross stabilizer. 06/28/2022: Pt. continues to try to engage her RUE as a gross stabilizer. Continues to stabilize the loofa 05/16/2022: Pt. Is able to place items, in her right hand, and use her right hand  to stabilize various sizes of bottles while using her left hand to open them. 04/04/2022: Pt. Is using her right hand to hold the loofa, and stabilize items. 30th visit: Pt. is initiating using her RUE as a gross stabilizer with  maxA 01/06/2022: Pt. is starting to engage her RUE as a gross stabiliizer with increased cues. Eval: pt. does not currently engage her RUE during ADLs. 10th visit: Pt. continues to need to work towards using her RUE as a gross stabilizer.     Time 12     Status On-going     Target Date 09/20/2022         OT LONG TERM GOAL #2    Title Pt. will perform UE dressing using one armed dressing techniques     Baseline Eval: MaxA, 10th visit: ModA 01/06/2022: min-modA 30th visit; min0-modA, 04/04/2022: minA donning loose shirts, MaxA tighter items/sports bra, independent doffing a shirt and bra 05/16/2022: minA donning loose shirts, MaxA tighter items/sports bra, independent doffing a shirt and bra. 06/28/2022: minA donning, and doffing loose T-shirts. 07/14/2022: minA donning loose shirts, and maxA tighter fitting clothing.     Time 12     Period  Weeks     Status On-going     Target Date 09/20/2022         OT LONG TERM GOAL #3    Title Pt. will require minA to stand and hike pants     Baseline Eval: MaxA, 10th visit: [place] 01/06/2022: [place] 30th visit: ModA 04/04/2022: independent with elastic pants if above the knees. MaxA if they fall below the knee; 04/07/22: min guard-min A to reach below knees to simulate hiking pants 05/16/2022: Min guard-MinA reaching below the knee to hike pants.06/28/2022: Min-CGA reaching to hike pants below the knees. 07/14/2022:    Time 12     Period Weeks     Status On-going     Target Date 09/30/2022         OT LONG TERM GOAL #4    Title Pt. will perform LE dressing with minA     Baseline Eval: MaxA 10th visit: Pt. requires MaxA for donning her brace, shoes, and socks. ModA with pants. 01/06/2022: ModA pants, MaxA shoes, socks, and brace 30th visit: ModA pants, MaxA shoes, socks, and brace. 04/04/2022: MaxA; 04/07/22: pt can don/doff L sock and shoe with supv, dep for RLE; see above for hiking pants 05/16/2022:  Donning and doffing the Left sock and shoe with supervision. Total Assist for  the RLE.06/28/2022: Supervision donning left shoe, and sock, Dep. For RLE. 07/14/2022: Supervision donning left shoe and  sock, and Dependent for RLE.    Time 12     Period Weeks     Status On-going     Target Date 09/20/22          OT LONG TERM GOAL #5    Title Pt. will improve FOTO score by 2 pt.s to reflect funcational change     Baseline Eval: FOTO score: 12, 10th visit: FOTO score 27 01/06/2022: 27 30th visit TBA at next visit secondary to no family available during the session; 04/04/22: FOTO 40 05/16/2022: 27 07/14/2022: 25    Time 12     Period Weeks     Status On-going     Target Date 09/20/22          OT LONG TERM GOAL #6    Title Patient will transfer to bedside commode or toilet with supervision.     Baseline 30th visit: min-modA. 01/06/2022: min-ModA 04/04/2022: Min-ModA 05/16/2022: min-modA 06/28/2022: min-ModA 07/14/2022: min-modA    Time 4     Period Weeks     Status On-going     Target Date 09/20/22         OT LONG TERM GOAL #7                                                      Title: Pt. Will improve AROM wrist extension by 5 degrees in preparation for improving functional  reaching.                                                                                  Baseline:  7/03: -28(72) 06/28/2022: -18(72) 07/14/2022: -18(58)            Time: 4           Period: Weeks           Status: Ongoing           Target Date: 09/20/2022   Plan - 03/24/22 1429     Clinical Impression Statement Pt. presents with 2/10 pain in the RUE upon arrival, using the Wong-Baker Facial Grimace Scale. Pt. Continues to require cues for soft tissue massage to the volar, dorsal surface of the forearm. Pt. Continues to present with increased tone, and tightness in the RUE. Pt. continues to work on normalizing tone, and facilitating active volitional movement in the RUE, and hand to improve engagement of the RUE during ADLs, as well as improve transfers, and functional mobility for toileting.                  OT Occupational Profile and History Detailed Assessment- Review of Records and additional review of physical, cognitive, psychosocial history related to current functional performance    Occupational performance deficits (Please refer to evaluation for details): Leisure;IADL's;ADL's    Body Structure / Function / Physical Skills ADL;IADL;FMC;ROM;UE functional use;Strength;Decreased knowledge of use of DME;Coordination    Rehab Potential Fair    Clinical Decision Making Several treatment options, min-mod task modification necessary    Comorbidities Affecting Occupational Performance: May have comorbidities impacting occupational performance    Modification or Assistance to Complete Evaluation  Min-Moderate modification of tasks or assist with assess necessary to complete eval    OT Frequency 2x / week    OT Duration 12 weeks    OT Treatment/Interventions Self-care/ADL training;Neuromuscular education;Electrical Stimulation;Patient/family education;Therapeutic activities;Passive range of motion;Therapist, nutritional;Therapeutic exercise;DME and/or AE instruction;Moist Heat    Consulted and Agree with Plan of Care Patient             Harrel Carina, MS, OTR/L  Harrel Carina, OT 08/11/2022, 3:34 PM

## 2022-08-16 ENCOUNTER — Ambulatory Visit: Payer: Medicare Other

## 2022-08-16 ENCOUNTER — Ambulatory Visit: Payer: Medicare Other | Admitting: Occupational Therapy

## 2022-08-16 ENCOUNTER — Encounter: Payer: Medicare Other | Admitting: Speech Pathology

## 2022-08-16 DIAGNOSIS — M6281 Muscle weakness (generalized): Secondary | ICD-10-CM | POA: Diagnosis not present

## 2022-08-16 DIAGNOSIS — R269 Unspecified abnormalities of gait and mobility: Secondary | ICD-10-CM

## 2022-08-16 DIAGNOSIS — R2681 Unsteadiness on feet: Secondary | ICD-10-CM

## 2022-08-16 DIAGNOSIS — I63512 Cerebral infarction due to unspecified occlusion or stenosis of left middle cerebral artery: Secondary | ICD-10-CM

## 2022-08-16 DIAGNOSIS — R262 Difficulty in walking, not elsewhere classified: Secondary | ICD-10-CM

## 2022-08-16 DIAGNOSIS — R278 Other lack of coordination: Secondary | ICD-10-CM

## 2022-08-16 NOTE — Therapy (Signed)
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) 09/24/2021   Cerebral embolism with cerebral infarction 09/23/2021   Pyelonephritis 09/19/2021   Pain due to onychomycosis of toenails of both feet 11/19/2020   Normocytic anemia 05/31/2020   Acute ischemic left MCA stroke (HCC) 04/30/2020   Right hemiplegia (HCC) 04/30/2020   Cerebrovascular accident (HCC) 04/21/2020   Atrial fibrillation with RVR (HCC) 04/21/2020   Essential hypertension 04/21/2020   Alcohol abuse 04/21/2020    RUE Measurements (Taken 04/04/22):   PROM Shoulder flexion:  7/03: 0(108)  5/18: 0(105)  PROM Shoulder  abduction: 7/03: 0(947)  5/18: 0(102)  PROM Elbow:   06/28/2022: 0(0-140) 7/03: 0(0-140)  5/18: 0(0-136)  AROM Wrist extension:  06/28/2022: -18/(72) 7/03: -28(72)  5/18: -40(72)  REFERRING DIAG: CVA  THERAPY DIAG:  Muscle weakness (generalized)  Rationale for Evaluation and Treatment Rehabilitation  PERTINENT HISTORY: Pt. is a 66 y.o. 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 Pt. has Right sided weakness, and receptive, and expressive aphasia. Pt. has had a previous CVA with right sided weakness in July 2021. PMHx includes: AFib, Renal Artery thrombosis, situational depression, HTN, Pyelnephritis, Seizure activity, COnstipation, AKI. Vitamin D deficiency, and urinary incontinence/  PRECAUTIONS: Fall   SUBJECTIVE:  Pt. reports that her daughter brought her to therapy today.   PAIN:  Are you having pain? No   OBJECTIVE:   OT TREATMENT  Manual Therapy:   Pt. tolerated soft tissue massage to the right scapular musculature. Pt. tolerated scapular mobilizations in elevation, depression, abduction, and rotation following moist heat modality to the right shoulder. Manual therapy was performed independent of, and in preparation for there. Ex. Pt. education was provided about soft tissue massage to the volar, and dorsal aspect of the forearm.    Therapeutic Exercise:   Pt. tolerated PROM through all joint ranges of the RUE, and hand following moist heat modality, and manual therapy. Pt. worked on reps of active right  wrist extension, and right supination with support proximally. Pt. education was provided about opportunities to engage the RUE during daily tasks, and IADL tasks.    Pt. presents with 0/10 pain today, using the Wong-Baker Facial Grimace Scale. Pt. Continues to require cues, and assist for techniques during soft tissue massage to the volar, dorsal surface of the forearm. Pt. Requires cues to perform  the task slowly. Pt. continues to present with increased tone, and tightness in the RUE. Pt. continues to work on normalizing tone, and facilitating active volitional movement in the RUE, and hand to improve engagement of the RUE during ADLs, as well as improve transfers, and functional mobility for toileting.    PATIENT EDUCATION: Education details: dressing techniques, HEP Person educated: Patient Education method: Customer service manager Education comprehension: verbalized understanding, returned demonstration, verbal cues required, and tactile cues required   Wright with ongoing HEPs for ROM to the RUE, hand, and digits.   OT SHORT TERM GOAL #1     Title Pt and caregiver will be independent with HEP for RUE     Baseline Eval: No current program. 10th visit: Caregivers  report being comfortable with exercises at home. 01/06/2022: Continue ongoing HEPs for UE. 30th visit: Pt./caregivers to continue with ROM; 40th: pt/caregivers to continue with PROM throughout RUE 05/16/2022:  Continue ongoing pt./caregiver education about HEPs for the RUE.06/28/2022: Pt. Reports consistency has been limited. 07/14/2022: Consistency with UE ROM at home has been limited.    Time 6    Period Weeks     Status On-going     Target Date 08/09/2022                    OT Long Term Goals -                OT LONG TERM GOAL #1    Title Pt. will engage the RUE as a gross assist/atabilizer during ADLs 100% of the time     Baseline 07/14/2022: Pt. Continues to try to engage her RUE as a gross stabilizer. 06/28/2022: Pt. continues to try to engage her RUE as a gross stabilizer. Continues to stabilize the loofa 05/16/2022: Pt. Is able to place items, in her right hand, and use her right hand  to stabilize various sizes of bottles while using her left hand to open them. 04/04/2022: Pt. Is using her right hand to hold the loofa, and stabilize items. 30th visit: Pt. is initiating using her  RUE as a gross stabilizer with maxA 01/06/2022: Pt. is starting to engage her RUE as a gross stabiliizer with increased cues. Eval: pt. does not currently engage her RUE during ADLs. 10th visit: Pt. continues to need to work towards using her RUE as a gross stabilizer.     Time 12     Status On-going     Target Date 09/20/2022         OT LONG TERM GOAL #2    Title Pt. will perform UE dressing using one armed dressing techniques     Baseline Eval: MaxA, 10th visit: [place] 01/06/2022: min-modA 30th visit; min0-modA, 04/04/2022: minA donning loose shirts, MaxA tighter items/sports bra, independent doffing a shirt and bra 05/16/2022: minA donning loose shirts, MaxA tighter items/sports bra, independent doffing a shirt and bra. 06/28/2022: minA donning, and doffing loose T-shirts. 07/14/2022: minA donning loose shirts, and maxA tighter fitting clothing.     Time 12     Period Weeks  Status On-going     Target Date 09/20/2022         OT LONG TERM GOAL #3    Title Pt. will require minA to stand and hike pants     Baseline Eval: MaxA, 10th visit: [place] 01/06/2022: [place] 30th visit: ModA 04/04/2022: independent with elastic pants if above the knees. MaxA if they fall below the knee; 04/07/22: min guard-min A to reach below knees to simulate hiking pants 05/16/2022: Min guard-MinA reaching below the knee to hike pants.06/28/2022: Min-CGA reaching to hike pants below the knees. 07/14/2022:    Time 12     Period Weeks     Status On-going     Target Date 09/30/2022         OT LONG TERM GOAL #4    Title Pt. will perform LE dressing with minA     Baseline Eval: MaxA 10th visit: Pt. requires MaxA for donning her brace, shoes, and socks. ModA with pants. 01/06/2022: ModA pants, MaxA shoes, socks, and brace 30th visit: ModA pants, MaxA shoes, socks, and brace. 04/04/2022: MaxA; 04/07/22: pt can don/doff L sock and shoe with supv, dep for RLE; see above for hiking pants 05/16/2022:  Donning and doffing the Left sock and shoe with  supervision. Total Assist for the RLE.06/28/2022: Supervision donning left shoe, and sock, Dep. For RLE. 07/14/2022: Supervision donning left shoe and  sock, and Dependent for RLE.    Time 12     Period Weeks     Status On-going     Target Date 09/20/22          OT LONG TERM GOAL #5    Title Pt. will improve FOTO score by 2 pt.s to reflect funcational change     Baseline Eval: FOTO score: 12, 10th visit: FOTO score 27 01/06/2022: 27 30th visit TBA at next visit secondary to no family available during the session; 04/04/22: FOTO 40 05/16/2022: 27 07/14/2022: 25    Time 12     Period Weeks     Status On-going     Target Date 09/20/22          OT LONG TERM GOAL #6    Title Patient will transfer to bedside commode or toilet with supervision.     Baseline 30th visit: min-modA. 01/06/2022: min-ModA 04/04/2022: Min-ModA 05/16/2022: min-modA 06/28/2022: min-ModA 07/14/2022: min-modA    Time 4     Period Weeks     Status On-going     Target Date 09/20/22         OT LONG TERM GOAL #7                                                      Title: Pt. Will improve AROM wrist extension by 5 degrees in preparation for improving functional  reaching.                                                                                  Baseline:       7/03: -  28(72) 06/28/2022: -18(72) 07/14/2022: -18(58)            Time: 4           Period: Weeks           Status: Ongoing           Target Date: 09/20/2022   Plan - 03/24/22 1429     Clinical Impression Statement Pt. presents with 0/10 pain today, using the Wong-Baker Facial Grimace Scale. Pt. Continues to require cues, and assist for techniques during soft tissue massage to the volar, dorsal surface of the forearm. Pt. Requires cues to perform the task slowly. Pt. continues to present with increased tone, and tightness in the RUE. Pt. continues to work on normalizing tone, and facilitating active volitional movement in the RUE, and hand to improve engagement of the  RUE during ADLs, as well as improve transfers, and functional mobility for toileting.                 OT Occupational Profile and History Detailed Assessment- Review of Records and additional review of physical, cognitive, psychosocial history related to current functional performance    Occupational performance deficits (Please refer to evaluation for details): Leisure;IADL's;ADL's    Body Structure / Function / Physical Skills ADL;IADL;FMC;ROM;UE functional use;Strength;Decreased knowledge of use of DME;Coordination    Rehab Potential Fair    Clinical Decision Making Several treatment options, min-mod task modification necessary    Comorbidities Affecting Occupational Performance: May have comorbidities impacting occupational performance    Modification or Assistance to Complete Evaluation  Min-Moderate modification of tasks or assist with assess necessary to complete eval    OT Frequency 2x / week    OT Duration 12 weeks    OT Treatment/Interventions Self-care/ADL training;Neuromuscular education;Electrical Stimulation;Patient/family education;Therapeutic activities;Passive range of motion;Therapist, nutritional;Therapeutic exercise;DME and/or AE instruction;Moist Heat    Consulted and Agree with Plan of Care Patient             Harrel Carina, MS, OTR/L  Harrel Carina, OT 08/16/2022, 10:28 PM

## 2022-08-16 NOTE — Therapy (Signed)
OUTPATIENT PHYSICAL THERAPY TREATMENT NOTE  Patient Name: Lindsey [name] MRN: [id number] DOB:December 20, 1955, 66 y.o., female Today's Date: 08/16/2022  PCP: Dr. Tomasa Hose MD REFERRING PROVIDER: Leeroy Cha MD   PT End of Session - 08/16/22 1102     Visit Number 48    Number of Visits 87    Date for PT Re-Evaluation 10/27/22    Authorization Type Medicare    Authorization Time Period Cert through 12/04/98; Recert 04/07/2262-12/04/5454; Recert 11/08/6387-37/34; Recert 28/04/6810-5/72/6203    Progress Note Due on Visit 36    PT Start Time 1058    PT Stop Time 1140    PT Time Calculation (min) 42 min    Equipment Utilized During Treatment Gait belt    Activity Tolerance Patient tolerated treatment well    Behavior During Therapy WFL for tasks assessed/performed                       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 No pertinent surgical history. 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 ([place]) 09/24/2021   Cerebral embolism with cerebral infarction 09/23/2021   Pyelonephritis 09/19/2021   Pain due to onychomycosis of toenails of both feet 11/19/2020   Normocytic anemia 05/31/2020   Acute ischemic left MCA stroke ([place]) 04/30/2020   Right hemiplegia ([place]) 04/30/2020    Cerebrovascular accident ([place]) 04/21/2020   Atrial fibrillation with RVR ([place]) 04/21/2020   Essential hypertension 04/21/2020   Alcohol abuse 04/21/2020    REFERRING DIAG: s/p CVA  THERAPY DIAG:  Muscle weakness (generalized)  Acute ischemic left MCA stroke (HCC)  Unsteadiness on feet  Other lack of coordination  Abnormality of gait and mobility  Difficulty in walking, not elsewhere classified  Rationale for Evaluation and Treatment Rehabilitation  PERTINENT HISTORY: Pt was doing very well and was walking short distances wqith a cane prior to second stroke in december. At this point she is limited to wheelchair as her main means of mobility. Pt caregiver reports all detailed information. Pt has hesitation with the left lower extremity and significant weakness on the right lower extremity. Pt will take 2-3 steps when transfering. Pt caregiver assists with all transfers.Pt has shower with ledge to steps over ledge when entering and utilizes bedside commode. Pt has 2 level home with chair lift for upstairs access. Pt would like to improve ransfers, improve neglect on her R side and improve her overall strength and mobility.Pt. is a 66 y.o. 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 Pt. has Right sided weakness, and receptive, and expressive aphasia. Pt. has had a previous CVA with right sided weakness in July 2021. PMHx includes: AFib, Renal Artery thrombosis, situational depression,  HTN, Pyelnephritis, Seizure activity, COnstipation, AKI. Vitamin D deficiency, and urinary incontinence.   PRECAUTIONS: fall risk, wearing RLE AFO  SUBJECTIVE:  Patient reports doing okay. Stated no pain and no recent falls.    PAIN:  Are you having pain? No      TODAY'S TREATMENT:  08/16/22    INTERVENTIONS:   THEREX:  PROM to right knee into flex/ext x 25 reps each AROM hip march on left x 20 reps (VC to perform  slowly with improved motor control)  AAROM hip march on right x 20 reps  Sit to stand with CGA- VC for scoot forward and to lean trunk forward x 5 reps. Less cues with each repetition.   Pyramid strengthening for B quad performing seated LAQ: (assist to push leg back due to weak hamstrings for more ROM/Power of quads)  12 reps- 0#  10 reps - 2# AW 8 reps- 2.5 # AW 6 reps- 3# AW then continued for 4 more-10 reps total at 3#     Ham curl (seated) pyramid with theraband each LE Yellow- 1 set of 12 reps Red - 1 sets of 10 reps Green - 1 set of 8 reps Blue- 1 set of 6 reps (VC to pull her right foot as far back as possible)   Pyramid strengthening for Hip ABDuctors: Yellow- each LE 1 set of 12 reps Red- each LE 1 set of 10 reps Red - right LE x 8 reps and Left LE - green x 8 reps Red- Right LE x 6 reps and Left LE - Blue x 6 reps     PATIENT EDUCATION: Education details: Pt educated throughout session about proper posture and technique with exercises. Improved exercise technique, movement at target joints, use of target muscles after min to mod verbal, visual, tactile cues. Continued education on technique with STS.  Person educated: Patient Education method: Explanation, Demonstration, and Verbal cues Education comprehension: verbalized understanding, returned demonstration, and needs further education   HOME EXERCISE PROGRAM: Continue as previously given;    PT Short Term Goals -       PT SHORT TERM GOAL #1   Title Patient will be independent in home exercise program to improve strength/mobility for better functional independence with ADLs.    Baseline no formal HEp for LE/balance at this time; 02/08/2022=Patient continues to require VC for reminders and assist with hip march/knee ext (seated for strengthening); Reminders to continue to work on static stand at home.    Time 4    Period Weeks    Status Partially Met  04/07/22: complete somewhat regularly     Target Date  12/14/21              PT Long Term Goals -       PT LONG TERM GOAL #1   Title Patient will increase FOTO score to equal to or greater than   41  to demonstrate statistically significant improvement in mobility and quality of life.    Baseline 16 on 2/14; 02/08/2022=Patient unable to complete today- No family present but will attempt again next visit available with family present to assist in completion; 02/17/2022= 40; 06/28/2022- No caregiver present in PT session to assist in answering questions. 08/04/2022= 40    Time 12    Period Weeks    Status On-going    Target Date 10/27/2022       PT LONG TERM GOAL #3   Title Patient  will complete five times sit to stand  test in < 30 seconds with UE assistance indicating an increased LE strength and improved balance.    Baseline 58.47 s with UE assist and with A from PT for R LE positioning; 02/08/2022=54 sec with use of L UE support and Min physical assist to position right LE.  7/6: 30.5 sec, min A for LE positioning, some attempts did not come to full erect posture, did not let go with L UE from armchair with any attempt 05/05/22: 26.65 sec   Time 12    Period Weeks    Status Met    Target Date 05/03/22      PT LONG TERM GOAL #4   Title Patient will increase Berg Balance score by > 6 points to demonstrate decreased fall risk during functional activities.    Baseline 12 on 2/14; 02/08/2022= 13/56  04/07/22: 17  8/3: 19    Time 12    Period Weeks    Status Met    Target Date 05/03/22      PT LONG TERM GOAL #5   Title Pt will transfer from seated position on table/chair to/from transport chair/WC in order to indicate improved transfer ability.    Baseline Requires Min A for LE positioning and to prevent LOB. 02/08/2022=Patient attempted to perform on her own- Unable to push herself up from chair- Very min assist to position right LE into more flexed position and VC for hand placement. Patient able to stand with min Assist with use of gait belt and  VC to lean; 02/17/2022=Patient was able to transfer from transport w/c with some physical assist with Right to swing to pivot to side of chair. She was able to sit to stand today with only CGA and then stand pivot with right arm holding onto arm of chair. Only difficulty was stand to sit- Max VC not to plop into chair. 04/07/22: Pt performed movement of B LE to side of chair independently with chair transfer. Pt required min A with R LE foot placement of floor. Pt requires increased time to complete but complete with CGA only for safety and no cues or A to prevent falling/ plopping into armchair.  8/3: Pt requires min A for maintenance of standig balance, able to navigate from transport chairr to arm chair with hemiwalker, does not controll descent with ahands still " flops" into chair; 08/04/2022- Patient continues to be inconsistent in her transfers- at times only CGA and other more min Assist and repetitive VC. Unclear due to some ongoing cognitive issues if patient will achieve this goal but still presents with potential.    Time 12    Period Weeks    Status Partially met    Target Date 10/27/2022       PT LONG TERM GOAL #6   Title Patient will ambulate > 150 feet with CGA using Hemiwalker on level surfaces for improved household and short community distances.    Baseline 02/08/2022= 40 feet with use of HW, heavy CGA with max VC for gait sequencing and w/c follow. 5/18 = 85 feet with use of HW, close CGA  7/6: 45 feet, heavy use of hemiwalker 8/3: 85 feet with heavy hemi walker use, no Lob throughout, cues for appropriate weight distribution; 06/28/2022 - 205 feet with use of hemiwalker, CGA; 08/04/2022- Patient presents with inconsistent gait ability - able to walk 150 feet with max effort and increased time using hemiwalker and CGA.    Time 12    Period Weeks    Status  On-going    Target Date 10/27/2022        PT LONG TERM GOAL #7   Title Pt will increase 10MWT by at least 0.13 m/s in order to  demonstrate clinically significant improvement in community ambulation.    Baseline 02/08/2022= 0.05 m/s using HW 7/6: .061ms with heavy ue use of hemiwalker and CGA for balance 8/3: .0573m 06/28/2022= 0.1 m/s (will keep goal active to ensure she is consistent); 08/04/2022- 0.09 m/s)   Time 12    Period Weeks    Status Ongoing     Target Date 10/27/2022      8.  Pt will improve 5X STS with full knee extension in standing each rep and with appropriate eccentric control in order to indicate improved transfer efficacy and improved LE strength  Baseline: 52.45 sec with UE support; 06/28/2022= 36.03 sec with UE support (VC for erect standing- patient continues to press up using back of legs against chair to stand); 08/04/2022- Patient required 45 sec using left UE and max VC (flopping at times with sitting)  Goal status: ONGOING TARGET date: 10/27/2022  9.  Patient will increase Berg Balance score by > 6 points to demonstrate decreased fall risk during functional activities Baseline: 19; 06/28/2022= 19/56; 08/04/2022- Deferred secondary to patient too fatigued to attempt after walking today. Will reassess next visit. Goal status: ONGOING Target Date: 10/27/2022           Plan -     Clinical Impression Statement Patient presents with good motivation for today's session. Patient able to continue with pyramid LE strength training and able to increase overall weight performed on left LE. She was fatigued yet able to perform using correct muscle - helped to perform left LE then right for coordination.  Pt will continue to benefit from skilled physical therapy intervention to address impairments, improve QOL, and attain therapy goals.    Personal Factors and Comorbidities Age;Comorbidity 1;Comorbidity 2;Comorbidity 3+;Time since onset of injury/illness/exacerbation;Other    Comorbidities AFib, Renal Artery thrombosis, situational depression, HTN, Pyelnephritis, Seizure activity, COnstipation, AKI     Examination-Activity Limitations Bathing;Bed Mobility;Carry;Locomotion Level;Dressing;Sit;Squat;Stairs;Stand;Toileting;Transfers    Examination-Participation Restrictions Church;Community Activity;Laundry    Stability/Clinical Decision Making Evolving/Moderate complexity    Rehab Potential Fair    PT Frequency 2x / week    PT Duration 12 weeks    PT Treatment/Interventions ADLs/Self Care Home Management;Gait training;Stair training;Therapeutic activities;Therapeutic exercise;Balance training;Neuromuscular re-education;Patient/family education;Wheelchair mobility training;Manual techniques;Energy conservation;Dry needling;Passive range of motion;Taping;Spinal Manipulations;Joint Manipulations    PT Next Visit Plan Gait training, Transfer training, LE strengthening as appropriate.    PT Home Exercise Plan HEP: general LE and postural exercises    Consulted and Agree with Plan of Care Patient            JeLewis MoccasinT   08/16/22, 11:46 AM

## 2022-08-18 ENCOUNTER — Ambulatory Visit: Payer: Medicare Other | Admitting: Occupational Therapy

## 2022-08-18 ENCOUNTER — Ambulatory Visit: Payer: Medicare Other

## 2022-08-18 ENCOUNTER — Encounter: Payer: Medicare Other | Admitting: Speech Pathology

## 2022-08-18 DIAGNOSIS — M6281 Muscle weakness (generalized): Secondary | ICD-10-CM | POA: Diagnosis not present

## 2022-08-18 DIAGNOSIS — R278 Other lack of coordination: Secondary | ICD-10-CM

## 2022-08-18 DIAGNOSIS — R262 Difficulty in walking, not elsewhere classified: Secondary | ICD-10-CM

## 2022-08-18 DIAGNOSIS — R2681 Unsteadiness on feet: Secondary | ICD-10-CM

## 2022-08-18 DIAGNOSIS — I63542 Cerebral infarction due to unspecified occlusion or stenosis of left cerebellar artery: Secondary | ICD-10-CM

## 2022-08-18 DIAGNOSIS — R269 Unspecified abnormalities of gait and mobility: Secondary | ICD-10-CM

## 2022-08-18 DIAGNOSIS — R2689 Other abnormalities of gait and mobility: Secondary | ICD-10-CM

## 2022-08-18 DIAGNOSIS — I63512 Cerebral infarction due to unspecified occlusion or stenosis of left middle cerebral artery: Secondary | ICD-10-CM

## 2022-08-18 NOTE — Therapy (Signed)
OUTPATIENT PHYSICAL THERAPY TREATMENT NOTE  Patient Name: Lindsey [name] MRN: [id number] DOB:1956/02/20, 66 y.o., female Today's Date: 08/19/2022  PCP: Dr. Tomasa Hose MD REFERRING PROVIDER: Leeroy Cha MD   PT End of Session - 08/18/22 1105     Visit Number 37    Number of Visits 82    Date for PT Re-Evaluation 10/27/22    Authorization Type Medicare    Authorization Time Period Cert through 11/11/39; Recert 12/03/4399-0/11/7251; Recert 03/08/4402-47/42; Recert 59/01/6386-5/64/3329    Progress Note Due on Visit 78    PT Start Time 1102    PT Stop Time 1140    PT Time Calculation (min) 38 min    Equipment Utilized During Treatment Gait belt    Activity Tolerance Patient tolerated treatment well    Behavior During Therapy WFL for tasks assessed/performed                        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 No pertinent surgical history. 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 ([place]) 09/24/2021   Cerebral embolism with cerebral infarction 09/23/2021   Pyelonephritis 09/19/2021   Pain due to onychomycosis of toenails of both feet 11/19/2020   Normocytic anemia 05/31/2020   Acute ischemic left MCA stroke ([place]) 04/30/2020   Right hemiplegia ([place]) 04/30/2020    Cerebrovascular accident ([place]) 04/21/2020   Atrial fibrillation with RVR ([place]) 04/21/2020   Essential hypertension 04/21/2020   Alcohol abuse 04/21/2020    REFERRING DIAG: s/p CVA  THERAPY DIAG:  Muscle weakness (generalized)  Acute ischemic left MCA stroke (HCC)  Unsteadiness on feet  Other lack of coordination  Abnormality of gait and mobility  Difficulty in walking, not elsewhere classified  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 At this point she is limited to wheelchair as her main means of mobility. Pt caregiver reports all detailed information. Pt has hesitation with the left lower extremity and significant weakness on the right lower extremity. Pt will take 2-3 steps when transfering. Pt caregiver assists with all transfers.Pt has shower with ledge to steps over ledge when entering and utilizes bedside commode. Pt has 2 level home with chair lift for upstairs access. Pt would like to improve ransfers, improve neglect on her R side and improve her overall strength and mobility.Pt. is a 66 y.o. 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 Pt. has Right sided weakness, and receptive, and expressive  aphasia. Pt. has had a previous CVA with right sided weakness in July 2021. PMHx includes: AFib, Renal Artery thrombosis, situational depression, HTN, Pyelnephritis, Seizure activity, COnstipation, AKI. Vitamin D deficiency, and urinary incontinence.   PRECAUTIONS: fall risk, wearing RLE AFO  SUBJECTIVE:  Patient reports some soreness after last visit but doing okay.    PAIN:  Are you having pain? No      TODAY'S TREATMENT:   08/18/22    INTERVENTIONS:   THEREX:  PROM to right knee into flex/ext x  AROM hip march on left x 20 reps (VC to perform slowly with improved motor control)  AAROM hip march on right x 20 reps  Sit to stand with CGA- VC for scoot forward and to lean trunk forward x 5 reps. Less cues with each repetition.   Patient ambulated in and around clinic with use of hemiwalker and CGA- Min A approx 180 feet today - which required approx 23 min to complete- Intermittent stoppage to regroup if she became off sequence. She continues to exhibit very intermittent LOB at times- mostly with turning  today.    PATIENT EDUCATION: Education details: Pt educated throughout session about proper posture and technique with exercises. Improved exercise technique, movement at target joints, use of target muscles after min to mod verbal, visual, tactile cues. Continued education on technique with STS.  Person educated: Patient Education method: Explanation, Demonstration, and Verbal cues Education comprehension: verbalized understanding, returned demonstration, and needs further education   HOME EXERCISE PROGRAM: Continue as previously given;    PT Short Term Goals -       PT SHORT TERM GOAL #1   Title Patient will be independent in home exercise program to improve strength/mobility for better functional independence with ADLs.    Baseline no formal HEp for LE/balance at this time; 02/08/2022=Patient continues to require VC for reminders and assist with hip march/knee ext (seated for strengthening); Reminders to continue to work on static stand at home.    Time 4    Period Weeks    Status Partially Met  04/07/22: complete somewhat regularly     Target Date 12/14/21              PT Long Term Goals -       PT LONG TERM GOAL #1   Title Patient will increase FOTO score to equal to or greater than   41  to demonstrate statistically significant improvement in mobility and quality of life.    Baseline  16 on 2/14; 02/08/2022=Patient unable to complete today- No family present but will attempt again next visit available with family present to assist in completion; 02/17/2022= 40; 06/28/2022- No caregiver present in PT session to assist in answering questions. 08/04/2022= 40    Time 12    Period Weeks    Status On-going    Target Date 10/27/2022       PT LONG TERM GOAL #3   Title Patient  will complete five times sit to stand test in < 30 seconds with UE assistance indicating an increased LE strength and improved balance.    Baseline 58.47 s with UE assist and with A from PT for R LE positioning; 02/08/2022=54 sec with use of L UE support and Min physical assist to position right LE.  7/6: 30.5 sec, min A for LE positioning, some attempts did not come to full erect posture, did not let go with L UE from armchair with any attempt 05/05/22: 26.65 sec  Time 12    Period Weeks    Status Met    Target Date 05/03/22      PT LONG TERM GOAL #4   Title Patient will increase Berg Balance score by > 6 points to demonstrate decreased fall risk during functional activities.    Baseline 12 on 2/14; 02/08/2022= 13/56  04/07/22: 17  8/3: 19    Time 12    Period Weeks    Status Met    Target Date 05/03/22      PT LONG TERM GOAL #5   Title Pt will transfer from seated position on table/chair to/from transport chair/WC in order to indicate improved transfer ability.    Baseline Requires Min A for LE positioning and to prevent LOB. 02/08/2022=Patient attempted to perform on her own- Unable to push herself up from chair- Very min assist to position right LE into more flexed position and VC for hand placement. Patient able to stand with min Assist with use of gait belt and VC to lean; 02/17/2022=Patient was able to transfer from transport w/c with some physical assist with Right to swing to pivot to side of chair. She was able to sit to stand today with only CGA and then stand pivot with right arm holding onto arm of chair.  Only difficulty was stand to sit- Max VC not to plop into chair. 04/07/22: Pt performed movement of B LE to side of chair independently with chair transfer. Pt required min A with R LE foot placement of floor. Pt requires increased time to complete but complete with CGA only for safety and no cues or A to prevent falling/ plopping into armchair.  8/3: Pt requires min A for maintenance of standig balance, able to navigate from transport chairr to arm chair with hemiwalker, does not controll descent with ahands still " flops" into chair; 08/04/2022- Patient continues to be inconsistent in her transfers- at times only CGA and other more min Assist and repetitive VC. Unclear due to some ongoing cognitive issues if patient will achieve this goal but still presents with potential.    Time 12    Period Weeks    Status Partially met    Target Date 10/27/2022       PT LONG TERM GOAL #6   Title Patient will ambulate > 150 feet with CGA using Hemiwalker on level surfaces for improved household and short community distances.    Baseline 02/08/2022= 40 feet with use of HW, heavy CGA with max VC for gait sequencing and w/c follow. 5/18 = 85 feet with use of HW, close CGA  7/6: 45 feet, heavy use of hemiwalker 8/3: 85 feet with heavy hemi walker use, no Lob throughout, cues for appropriate weight distribution; 06/28/2022 - 205 feet with use of hemiwalker, CGA; 08/04/2022- Patient presents with inconsistent gait ability - able to walk 150 feet with max effort and increased time using hemiwalker and CGA.    Time 12    Period Weeks    Status On-going    Target Date 10/27/2022        PT LONG TERM GOAL #7   Title Pt will increase 10MWT by at least 0.13 m/s in order to demonstrate clinically significant improvement in community ambulation.    Baseline 02/08/2022= 0.05 m/s using HW 7/6: .096ms with heavy ue use of hemiwalker and CGA for balance 8/3: .0571m 06/28/2022= 0.1 m/s (will keep goal active to ensure she is  consistent); 08/04/2022- 0.09 m/s)   Time 12  Period Weeks    Status Ongoing     Target Date 10/27/2022      8.  Pt will improve 5X STS with full knee extension in standing each rep and with appropriate eccentric control in order to indicate improved transfer efficacy and improved LE strength  Baseline: 52.45 sec with UE support; 06/28/2022= 36.03 sec with UE support (VC for erect standing- patient continues to press up using back of legs against chair to stand); 08/04/2022- Patient required 45 sec using left UE and max VC (flopping at times with sitting)  Goal status: ONGOING TARGET date: 10/27/2022  9.  Patient will increase Berg Balance score by > 6 points to demonstrate decreased fall risk during functional activities Baseline: 19; 06/28/2022= 19/56; 08/04/2022- Deferred secondary to patient too fatigued to attempt after walking today. Will reassess next visit. Goal status: ONGOING Target Date: 10/27/2022           Plan -     Clinical Impression Statement Patient presents with good motivation for today's session. Treatment focused on walking today after stretching. Patient starts off slow requiring multiple VC/Tactile VC for proper sequencing. She did improve on the straight long pathways yet if distracted requiring more cues. She continues to ambulate slowly. Emphasized importance of walking at home to achieve max rehab potential.   Pt will continue to benefit from skilled physical therapy intervention to address impairments, improve QOL, and attain therapy goals.    Personal Factors and Comorbidities Age;Comorbidity 1;Comorbidity 2;Comorbidity 3+;Time since onset of injury/illness/exacerbation;Other    Comorbidities AFib, Renal Artery thrombosis, situational depression, HTN, Pyelnephritis, Seizure activity, COnstipation, AKI    Examination-Activity Limitations Bathing;Bed Mobility;Carry;Locomotion Level;Dressing;Sit;Squat;Stairs;Stand;Toileting;Transfers    Examination-Participation  Restrictions Church;Community Activity;Laundry    Stability/Clinical Decision Making Evolving/Moderate complexity    Rehab Potential Fair    PT Frequency 2x / week    PT Duration 12 weeks    PT Treatment/Interventions ADLs/Self Care Home Management;Gait training;Stair training;Therapeutic activities;Therapeutic exercise;Balance training;Neuromuscular re-education;Patient/family education;Wheelchair mobility training;Manual techniques;Energy conservation;Dry needling;Passive range of motion;Taping;Spinal Manipulations;Joint Manipulations    PT Next Visit Plan Gait training, Transfer training, LE strengthening as appropriate.    PT Home Exercise Plan HEP: general LE and postural exercises    Consulted and Agree with Plan of Care Patient            Lewis Moccasin PT   08/19/22, 7:57 AM

## 2022-08-18 NOTE — Therapy (Addendum)
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 ([name]) 09/24/2021   Cerebral embolism with cerebral infarction 09/23/2021   Pyelonephritis 09/19/2021   Pain due to onychomycosis of toenails of both feet 11/19/2020   Normocytic anemia 05/31/2020   Acute ischemic left MCA stroke ([name]) 04/30/2020   Right hemiplegia ([name]) 04/30/2020   Cerebrovascular accident ([address]) 04/21/2020   Atrial fibrillation with RVR ([place]) 04/21/2020   Essential hypertension 04/21/2020   Alcohol abuse 04/21/2020    RUE Measurements (Taken 04/04/22):   PROM Shoulder flexion:  7/03: 0(108)  5/18: 0(105)  PROM Shoulder  abduction: 7/03: [id number])  5/18: 0(102)  PROM Elbow:   06/28/2022: 0(0-140) 7/03: 0(0-140)  5/18: 0(0-136)  AROM Wrist extension:  06/28/2022: -18/(72) 7/03: -28(72)  5/18: -40(72)  REFERRING DIAG: CVA  THERAPY DIAG:  Muscle weakness (generalized)  Other lack of coordination  Rationale for Evaluation and Treatment Rehabilitation  PERTINENT HISTORY: Pt. is a 66 y.o. 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 Pt. has Right sided weakness, and receptive, and expressive aphasia. Pt. has had a previous CVA with right sided weakness in July 2021. PMHx includes: AFib, Renal Artery thrombosis, situational depression, HTN, Pyelnephritis, Seizure activity, COnstipation, AKI. Vitamin D deficiency, and urinary incontinence/  PRECAUTIONS: Fall   SUBJECTIVE:  Pt. reports that her daughter brought her to therapy today.   PAIN:  Are you having pain? No   OBJECTIVE:   OT TREATMENT  Manual Therapy:   Pt. tolerated soft tissue massage to the right scapular musculature. Pt. tolerated scapular mobilizations in elevation, depression, abduction, and rotation following moist heat modality to the right shoulder. Manual therapy was performed independent of, and in preparation for there. Ex. Pt. education was provided about soft tissue massage to the volar, and dorsal aspect of the forearm.    Therapeutic Exercise:   Pt. tolerated PROM through all joint ranges of the RUE, and hand following moist heat modality, and manual therapy. Pt. worked on  reps of active right wrist extension, and right supination with support proximally. Pt. education was provided about opportunities to engage the RUE during daily tasks, and IADL tasks.     Pt. was late for therapy this morning. Pt. continues to require cues, and assist for techniques during soft tissue massage to the volar, dorsal surface of the forearm. Pt. continues to present with  increased tone, and tightness in the RUE. Pt. Reports limited UE ROM at home. Pt. continues to work on normalizing tone, and facilitating active volitional movement in the RUE, and hand to improve engagement of the RUE during ADLs, as well as improve transfers, and functional mobility for toileting.    PATIENT EDUCATION: Education details: dressing techniques, HEP Person educated: Patient Education method: Medical illustrator Education comprehension: verbalized understanding, returned demonstration, verbal cues required, and tactile cues required   HOME EXERCISE PROGRAM   Continue with ongoing HEPs for ROM to the RUE, hand, and digits.   OT SHORT TERM GOAL #1     Title Pt and caregiver will be independent with HEP for RUE     Baseline Eval: No current program. 10th visit: Caregivers  report being comfortable with exercises at home. 01/06/2022: Continue ongoing HEPs for UE. 30th visit: Pt./caregivers to continue with ROM; 40th: pt/caregivers to continue with PROM throughout RUE 05/16/2022:  Continue ongoing pt./caregiver education about HEPs for the RUE.06/28/2022: Pt. Reports consistency has been limited. 07/14/2022: Consistency with UE ROM at home has been limited.    Time 6    Period Weeks     Status On-going     Target Date 08/09/2022                    OT Long Term Goals -                OT LONG TERM GOAL #1    Title Pt. will engage the RUE as a gross assist/atabilizer during ADLs 100% of the time     Baseline 07/14/2022: Pt. Continues to try to engage her RUE as a gross stabilizer. 06/28/2022: Pt. continues to try to engage her RUE as a gross stabilizer. Continues to stabilize the loofa 05/16/2022: Pt. Is able to place items, in her right hand, and use her right hand  to stabilize various sizes of bottles while using her left hand to open them. 04/04/2022: Pt. Is using her right hand to hold the loofa, and stabilize items. 30th visit: Pt. is initiating using her RUE as a  gross stabilizer with maxA 01/06/2022: Pt. is starting to engage her RUE as a gross stabiliizer with increased cues. Eval: pt. does not currently engage her RUE during ADLs. 10th visit: Pt. continues to need to work towards using her RUE as a gross stabilizer.     Time 12     Status On-going     Target Date 09/20/2022         OT LONG TERM GOAL #2    Title Pt. will perform UE dressing using one armed dressing techniques     Baseline Eval: MaxA, 10th visit: ModA 01/06/2022: min-modA 30th visit; min0-modA, 04/04/2022: minA donning loose shirts, MaxA tighter items/sports bra, independent doffing a shirt and bra 05/16/2022: minA donning loose shirts, MaxA tighter items/sports bra, independent doffing a shirt and bra. 06/28/2022: minA donning, and doffing loose T-shirts. 07/14/2022: minA donning loose shirts, and maxA tighter fitting clothing.     Time 12     Period Weeks  Status On-going     Target Date 09/20/2022         OT LONG TERM GOAL #3    Title Pt. will require minA to stand and hike pants     Baseline Eval: MaxA, 10th visit: [place] 01/06/2022: [place] 30th visit: ModA 04/04/2022: independent with elastic pants if above the knees. MaxA if they fall below the knee; 04/07/22: min guard-min A to reach below knees to simulate hiking pants 05/16/2022: Min guard-MinA reaching below the knee to hike pants.06/28/2022: Min-CGA reaching to hike pants below the knees. 07/14/2022:    Time 12     Period Weeks     Status On-going     Target Date 09/30/2022         OT LONG TERM GOAL #4    Title Pt. will perform LE dressing with minA     Baseline Eval: MaxA 10th visit: Pt. requires MaxA for donning her brace, shoes, and socks. ModA with pants. 01/06/2022: ModA pants, MaxA shoes, socks, and brace 30th visit: ModA pants, MaxA shoes, socks, and brace. 04/04/2022: MaxA; 04/07/22: pt can don/doff L sock and shoe with supv, dep for RLE; see above for hiking pants 05/16/2022:  Donning and doffing the Left sock and shoe with  supervision. Total Assist for the RLE.06/28/2022: Supervision donning left shoe, and sock, Dep. For RLE. 07/14/2022: Supervision donning left shoe and  sock, and Dependent for RLE.    Time 12     Period Weeks     Status On-going     Target Date 09/20/22          OT LONG TERM GOAL #5    Title Pt. will improve FOTO score by 2 pt.s to reflect funcational change     Baseline Eval: FOTO score: 12, 10th visit: FOTO score 27 01/06/2022: 27 30th visit TBA at next visit secondary to no family available during the session; 04/04/22: FOTO 40 05/16/2022: 27 07/14/2022: 25    Time 12     Period Weeks     Status On-going     Target Date 09/20/22          OT LONG TERM GOAL #6    Title Patient will transfer to bedside commode or toilet with supervision.     Baseline 30th visit: min-modA. 01/06/2022: min-ModA 04/04/2022: Min-ModA 05/16/2022: min-modA 06/28/2022: min-ModA 07/14/2022: min-modA    Time 4     Period Weeks     Status On-going     Target Date 09/20/22         OT LONG TERM GOAL #7                                                      Title: Pt. Will improve AROM wrist extension by 5 degrees in preparation for improving functional  reaching.                                                                                  Baseline:       7/03: -  28(72) 06/28/2022: -18(72) 07/14/2022: -18(58)            Time: 4           Period: Weeks           Status: Ongoing           Target Date: 09/20/2022   Plan - 03/24/22 1429     Clinical Impression Statement Pt. was late for therapy this morning. Pt. continues to require cues, and assist for techniques during soft tissue massage to the volar, dorsal surface of the forearm. Pt. continues to present with increased tone, and tightness in the RUE. Pt. Reports limited UE ROM at home. Pt. continues to work on normalizing tone, and facilitating active volitional movement in the RUE, and hand to improve engagement of the RUE during ADLs, as well as improve transfers, and  functional mobility for toileting.                 OT Occupational Profile and History Detailed Assessment- Review of Records and additional review of physical, cognitive, psychosocial history related to current functional performance    Occupational performance deficits (Please refer to evaluation for details): Leisure;IADL's;ADL's    Body Structure / Function / Physical Skills ADL;IADL;FMC;ROM;UE functional use;Strength;Decreased knowledge of use of DME;Coordination    Rehab Potential Fair    Clinical Decision Making Several treatment options, min-mod task modification necessary    Comorbidities Affecting Occupational Performance: May have comorbidities impacting occupational performance    Modification or Assistance to Complete Evaluation  Min-Moderate modification of tasks or assist with assess necessary to complete eval    OT Frequency 2x / week    OT Duration 12 weeks    OT Treatment/Interventions Self-care/ADL training;Neuromuscular education;Electrical Stimulation;Patient/family education;Therapeutic activities;Passive range of motion;Building services engineer;Therapeutic exercise;DME and/or AE instruction;Moist Heat    Consulted and Agree with Plan of Care Patient             Olegario Messier, MS, OTR/L  Olegario Messier, OT 08/18/2022, 11:17 AM

## 2022-08-23 ENCOUNTER — Ambulatory Visit: Payer: Medicare Other | Admitting: Occupational Therapy

## 2022-08-23 ENCOUNTER — Encounter: Payer: Medicare Other | Admitting: Speech Pathology

## 2022-08-23 ENCOUNTER — Ambulatory Visit: Payer: Medicare Other

## 2022-08-30 ENCOUNTER — Ambulatory Visit: Payer: Medicare Other | Admitting: Occupational Therapy

## 2022-08-30 ENCOUNTER — Encounter: Payer: Medicare Other | Admitting: Speech Pathology

## 2022-08-30 ENCOUNTER — Ambulatory Visit: Payer: Medicare Other

## 2022-08-31 NOTE — Therapy (Incomplete)
OUTPATIENT PHYSICAL THERAPY TREATMENT NOTE  Patient Name: Lindsey [name] MRN: [id number] DOB:01/06/1956, 66 y.o., female Today's Date: 09/01/2022  PCP: Dr. 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  Rationale for Evaluation and Treatment Rehabilitation  PERTINENT HISTORY: Pt was doing very well and was walking short distances wqith a cane prior to second stroke in december. At this point she is limited to wheelchair as her main means of mobility. Pt caregiver reports all detailed information. Pt has hesitation with the left lower extremity and significant weakness  on the right lower extremity. Pt will take 2-3 steps when transfering. Pt caregiver assists with all transfers.Pt has shower with ledge to steps over ledge when entering and utilizes bedside commode. Pt has 2 level home with chair lift for upstairs access. Pt would like to improve ransfers, improve neglect on her R side and improve her overall strength and mobility.Pt. is a 66 y.o. 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 Pt. has Right sided weakness, and receptive, and expressive aphasia. Pt. has had a previous CVA with right sided weakness in July 2021. PMHx includes: AFib, Renal Artery thrombosis, situational depression, HTN, Pyelnephritis, Seizure activity, COnstipation, AKI. Vitamin D deficiency, and urinary incontinence.   PRECAUTIONS: fall risk, wearing RLE AFO  SUBJECTIVE:     PAIN:  Are you having pain? No      TODAY'S TREATMENT:  08/18/22    INTERVENTIONS:   THEREX:  PROM to right knee into flex/ext x  AROM hip march on left x 20 reps (VC to perform slowly with improved motor control)  AAROM hip march on right x 20 reps  Sit to stand with CGA- VC for scoot forward and to lean trunk forward x 5 reps. Less cues with each repetition.   Patient ambulated in and around clinic with use of hemiwalker and CGA- Min A approx 180 feet today - which required approx 23 min to complete- Intermittent stoppage to regroup if she became off sequence. She continues to exhibit very  intermittent LOB at times- mostly with turning  today.    PATIENT EDUCATION: Education details: Pt educated throughout session about proper posture and technique with exercises. Improved exercise technique, movement at target joints, use of target muscles after min to mod verbal, visual, tactile cues. Continued education on technique with STS.  Person educated: Patient Education method: Explanation, Demonstration, and Verbal  cues Education comprehension: verbalized understanding, returned demonstration, and needs further education   HOME EXERCISE PROGRAM: Continue as previously given;    PT Short Term Goals -       PT SHORT TERM GOAL #1   Title Patient will be independent in home exercise program to improve strength/mobility for better functional independence with ADLs.    Baseline no formal HEp for LE/balance at this time; 02/08/2022=Patient continues to require VC for reminders and assist with hip march/knee ext (seated for strengthening); Reminders to continue to work on static stand at home.    Time 4    Period Weeks    Status Partially Met  04/07/22: complete somewhat regularly     Target Date 12/14/21              PT Long Term Goals -       PT LONG TERM GOAL #1   Title Patient will increase FOTO score to equal to or greater than   41  to demonstrate statistically significant improvement in mobility and quality of life.    Baseline 16 on 2/14; 02/08/2022=Patient unable to complete today- No family present but will attempt again next visit available with family present to assist in completion; 02/17/2022= 40; 06/28/2022- No caregiver present in PT session to assist in answering questions. 08/04/2022= 40    Time 12    Period Weeks    Status On-going    Target Date 10/27/2022       PT LONG TERM GOAL #3   Title Patient  will complete five times sit to stand test in < 30 seconds with UE assistance indicating an increased LE strength and improved balance.    Baseline 58.47 s with UE assist and with A from PT for R LE positioning; 02/08/2022=54 sec with use of L UE support and Min physical assist to position right LE.  7/6: 30.5 sec, min A for LE positioning, some attempts did not come to full erect posture, did not let go with L UE from armchair with any attempt 05/05/22: 26.65 sec   Time 12    Period Weeks    Status Met    Target Date 05/03/22      PT LONG TERM GOAL #4   Title Patient will increase  Berg Balance score by > 6 points to demonstrate decreased fall risk during functional activities.    Baseline 12 on 2/14; 02/08/2022= 13/56  04/07/22: 17  8/3: 19    Time 12    Period Weeks    Status Met    Target Date 05/03/22      PT LONG TERM GOAL #5   Title Pt will transfer from seated position on table/chair to/from transport chair/WC in order to indicate improved transfer ability.    Baseline Requires Min A for LE positioning and to prevent LOB. 02/08/2022=Patient attempted to perform on her own- Unable to push herself up from chair- Very min assist to position right LE into more flexed position and VC for hand placement. Patient able to stand with min Assist with use of gait belt and VC to lean; 02/17/2022=Patient was able  to transfer from transport w/c with some physical assist with Right to swing to pivot to side of chair. She was able to sit to stand today with only CGA and then stand pivot with right arm holding onto arm of chair. Only difficulty was stand to sit- Max VC not to plop into chair. 04/07/22: Pt performed movement of B LE to side of chair independently with chair transfer. Pt required min A with R LE foot placement of floor. Pt requires increased time to complete but complete with CGA only for safety and no cues or A to prevent falling/ plopping into armchair.  8/3: Pt requires min A for maintenance of standig balance, able to navigate from transport chairr to arm chair with hemiwalker, does not controll descent with ahands still " flops" into chair; 08/04/2022- Patient continues to be inconsistent in her transfers- at times only CGA and other more min Assist and repetitive VC. Unclear due to some ongoing cognitive issues if patient will achieve this goal but still presents with potential.    Time 12    Period Weeks    Status Partially met    Target Date 10/27/2022       PT LONG TERM GOAL #6   Title Patient will ambulate > 150 feet with CGA using Hemiwalker on level surfaces for  improved household and short community distances.    Baseline 02/08/2022= 40 feet with use of HW, heavy CGA with max VC for gait sequencing and w/c follow. 5/18 = 85 feet with use of HW, close CGA  7/6: 45 feet, heavy use of hemiwalker 8/3: 85 feet with heavy hemi walker use, no Lob throughout, cues for appropriate weight distribution; 06/28/2022 - 205 feet with use of hemiwalker, CGA; 08/04/2022- Patient presents with inconsistent gait ability - able to walk 150 feet with max effort and increased time using hemiwalker and CGA.    Time 12    Period Weeks    Status On-going    Target Date 10/27/2022        PT LONG TERM GOAL #7   Title Pt will increase 10MWT by at least 0.13 m/s in order to demonstrate clinically significant improvement in community ambulation.    Baseline 02/08/2022= 0.05 m/s using HW 7/6: .077ms with heavy ue use of hemiwalker and CGA for balance 8/3: .0582m 06/28/2022= 0.1 m/s (will keep goal active to ensure she is consistent); 08/04/2022- 0.09 m/s)   Time 12    Period Weeks    Status Ongoing     Target Date 10/27/2022      8.  Pt will improve 5X STS with full knee extension in standing each rep and with appropriate eccentric control in order to indicate improved transfer efficacy and improved LE strength  Baseline: 52.45 sec with UE support; 06/28/2022= 36.03 sec with UE support (VC for erect standing- patient continues to press up using back of legs against chair to stand); 08/04/2022- Patient required 45 sec using left UE and max VC (flopping at times with sitting)  Goal status: ONGOING TARGET date: 10/27/2022  9.  Patient will increase Berg Balance score by > 6 points to demonstrate decreased fall risk during functional activities Baseline: 19; 06/28/2022= 19/56; 08/04/2022- Deferred secondary to patient too fatigued to attempt after walking today. Will reassess next visit. Goal status: ONGOING Target Date: 10/27/2022           Plan -     Clinical Impression Statement  Patient presents with good motivation for today's session.  Treatment focused on walking today after stretching. Patient starts off slow requiring multiple VC/Tactile VC for proper sequencing. She did improve on the straight long pathways yet if distracted requiring more cues. She continues to ambulate slowly. Emphasized importance of walking at home to achieve max rehab potential.   Pt will continue to benefit from skilled physical therapy intervention to address impairments, improve QOL, and attain therapy goals.    Personal Factors and Comorbidities Age;Comorbidity 1;Comorbidity 2;Comorbidity 3+;Time since onset of injury/illness/exacerbation;Other    Comorbidities AFib, Renal Artery thrombosis, situational depression, HTN, Pyelnephritis, Seizure activity, COnstipation, AKI    Examination-Activity Limitations Bathing;Bed Mobility;Carry;Locomotion Level;Dressing;Sit;Squat;Stairs;Stand;Toileting;Transfers    Examination-Participation Restrictions Church;Community Activity;Laundry    Stability/Clinical Decision Making Evolving/Moderate complexity    Rehab Potential Fair    PT Frequency 2x / week    PT Duration 12 weeks    PT Treatment/Interventions ADLs/Self Care Home Management;Gait training;Stair training;Therapeutic activities;Therapeutic exercise;Balance training;Neuromuscular re-education;Patient/family education;Wheelchair mobility training;Manual techniques;Energy conservation;Dry needling;Passive range of motion;Taping;Spinal Manipulations;Joint Manipulations    PT Next Visit Plan Gait training, Transfer training, LE strengthening as appropriate.    PT Home Exercise Plan HEP: general LE and postural exercises    Consulted and Agree with Plan of Care Patient            Lewis Moccasin PT   09/01/22, 9:22 AM

## 2022-09-01 ENCOUNTER — Encounter: Payer: Medicare Other | Admitting: Speech Pathology

## 2022-09-01 ENCOUNTER — Ambulatory Visit: Payer: Medicare Other

## 2022-09-01 ENCOUNTER — Ambulatory Visit: Payer: Medicare Other | Admitting: Occupational Therapy

## 2022-09-01 NOTE — Telephone Encounter (Signed)
Pt. Did not arrive this morning for her therapy appointments. An attempt was made to reach out to the Pt. Spoke with the Pt.'s daughter who expressed confusion about the Pt.'s missed appointment, and coordination of the appointments with Pt.'s caregiver.

## 2022-09-05 ENCOUNTER — Ambulatory Visit: Payer: Medicare Other | Admitting: Physical Medicine and Rehabilitation

## 2022-09-06 ENCOUNTER — Encounter: Payer: Medicare Other | Admitting: Speech Pathology

## 2022-09-06 ENCOUNTER — Ambulatory Visit: Payer: Medicare Other | Attending: Physical Medicine and Rehabilitation | Admitting: Occupational Therapy

## 2022-09-06 ENCOUNTER — Ambulatory Visit: Payer: Medicare Other | Admitting: Physical Therapy

## 2022-09-06 DIAGNOSIS — I63542 Cerebral infarction due to unspecified occlusion or stenosis of left cerebellar artery: Secondary | ICD-10-CM | POA: Diagnosis present

## 2022-09-06 DIAGNOSIS — I63512 Cerebral infarction due to unspecified occlusion or stenosis of left middle cerebral artery: Secondary | ICD-10-CM | POA: Diagnosis present

## 2022-09-06 DIAGNOSIS — R278 Other lack of coordination: Secondary | ICD-10-CM | POA: Insufficient documentation

## 2022-09-06 DIAGNOSIS — R269 Unspecified abnormalities of gait and mobility: Secondary | ICD-10-CM | POA: Diagnosis present

## 2022-09-06 DIAGNOSIS — M6281 Muscle weakness (generalized): Secondary | ICD-10-CM | POA: Insufficient documentation

## 2022-09-06 DIAGNOSIS — R2689 Other abnormalities of gait and mobility: Secondary | ICD-10-CM | POA: Insufficient documentation

## 2022-09-06 DIAGNOSIS — R2681 Unsteadiness on feet: Secondary | ICD-10-CM

## 2022-09-06 DIAGNOSIS — R482 Apraxia: Secondary | ICD-10-CM | POA: Insufficient documentation

## 2022-09-06 DIAGNOSIS — R262 Difficulty in walking, not elsewhere classified: Secondary | ICD-10-CM

## 2022-09-06 NOTE — Therapy (Signed)
OUTPATIENT PHYSICAL THERAPY TREATMENT NOTE  Patient Name: Lindsey [name] MRN: [id number] DOB:03-07-1956, 66 y.o., female Today's Date: 09/06/2022  PCP: Dr. Tomasa Hose MD REFERRING PROVIDER: Leeroy Cha MD   PT End of Session - 09/06/22 1237     Visit Number 83    Number of Visits 77    Date for PT Re-Evaluation 10/27/22    Authorization Type Medicare    Authorization Time Period Cert through 02/07/31; Recert 2/0/2542-7/0/6237; Recert 03/04/8314-17/61; Recert 60/04/3709-03/28/9484    Progress Note Due on Visit 21    PT Start Time 1052    PT Stop Time 1135    PT Time Calculation (min) 43 min    Equipment Utilized During Treatment Gait belt    Activity Tolerance Patient tolerated treatment well    Behavior During Therapy WFL for tasks assessed/performed                         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 ([place]) 09/24/2021   Cerebral embolism with cerebral infarction 09/23/2021   Pyelonephritis 09/19/2021   Pain due to onychomycosis of toenails of both feet 11/19/2020   Normocytic anemia 05/31/2020   Acute ischemic left MCA stroke ([place]) 04/30/2020   Right hemiplegia ([place]) 04/30/2020   Cerebrovascular accident  ([place]) 04/21/2020   Atrial fibrillation with RVR ([place]) 04/21/2020   Essential hypertension 04/21/2020   Alcohol abuse 04/21/2020    REFERRING DIAG: s/p CVA  THERAPY DIAG:  Muscle weakness (generalized)  Unsteadiness on feet  Abnormality of gait and mobility  Difficulty in walking, not elsewhere classified  Rationale for Evaluation and Treatment Rehabilitation  PERTINENT HISTORY: Pt was doing very well and was walking short distances wqith a cane prior to second stroke in december. At this point she is limited to wheelchair as her main means of mobility. Pt caregiver reports all detailed information. Pt has hesitation with the left lower extremity and significant weakness on the right lower extremity. Pt will take 2-3 steps when transfering. Pt caregiver assists with all transfers.Pt has shower with ledge to steps over ledge when entering and utilizes bedside commode. Pt has 2 level home with chair lift for upstairs access. Pt would like to improve ransfers, improve neglect on her R side and improve her overall strength and mobility.Pt. is a 66 y.o. 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 Pt. has Right sided weakness, and receptive, and expressive aphasia. Pt. has had a previous CVA with right sided weakness in July 2021. PMHx includes: AFib, Renal Artery thrombosis, situational depression, HTN, Pyelnephritis, Seizure activity, COnstipation, AKI. Vitamin D deficiency, and  urinary incontinence.   PRECAUTIONS: fall risk, wearing RLE AFO  SUBJECTIVE:  Patient reports she is doing well with no significant changes since last session.   PAIN:  Are you having pain? No      TODAY'S TREATMENT:  09/06/22     INTERVENTIONS:   THEREX:   STS transfer from transport chair to standard arm chair with mod A-max A from PT for safety and for LE movement and control   PROM to right knee into flex/ext 2*10 AROM on L  2 x 10  AROM hip march on left x 20 reps (VC to perform slowly with improved motor control)  AAROM hip march on right x 20 reps    Ham curl (seated) pyramid with theraband each LE Yellow- 1 set of 12 reps Red - 1 sets of 10 reps Green - 1 set of 8 reps Blue- 1 set of 6 reps (VC to pull her right foot as far back as possible)    Yellow- each LE 1 set of 12 reps Red- each LE 1 set of 10 reps Red - right LE 3*8 reps ea LE   Sit to stand with CGA- VC for scoot forward and to lean trunk forward x 5 reps. Less cues with each repetition.   Ball adductor squeeze, Max A with limiting L LE movement in order to isolate R LE adduction  3 x 10 reps   STS transfer from chair to transport chair with mod A-max A from PT for safety  PATIENT EDUCATION: Education details: Pt educated throughout session about proper posture and technique with exercises. Improved exercise technique, movement at target joints, use of target muscles after min to mod verbal, visual, tactile cues. Continued education on technique with STS.  Person educated: Patient Education method: Explanation, Demonstration, and Verbal cues Education comprehension: verbalized understanding, returned demonstration, and needs further education   HOME EXERCISE PROGRAM: Continue as previously given;    PT Short Term Goals -       PT SHORT TERM GOAL #1   Title Patient will be independent in home exercise program to improve strength/mobility for better functional independence with ADLs.    Baseline no formal HEp for LE/balance at this time; 02/08/2022=Patient continues to require VC for reminders and assist with hip march/knee ext (seated for strengthening); Reminders to continue to work on static stand at home.    Time 4    Period Weeks    Status Partially Met  04/07/22: complete somewhat regularly     Target Date 12/14/21              PT Long Term Goals -       PT LONG TERM GOAL #1   Title Patient will increase FOTO score  to equal to or greater than   41  to demonstrate statistically significant improvement in mobility and quality of life.    Baseline 16 on 2/14; 02/08/2022=Patient unable to complete today- No family present but will attempt again next visit available with family present to assist in completion; 02/17/2022= 40; 06/28/2022- No caregiver present in PT session to assist in answering questions. 08/04/2022= 40    Time 12    Period Weeks    Status On-going    Target Date 10/27/2022       PT LONG TERM GOAL #3   Title Patient  will complete five times sit to stand test in < 30 seconds with UE assistance indicating an increased LE strength and improved balance.  Baseline 58.47 s with UE assist and with A from PT for R LE positioning; 02/08/2022=54 sec with use of L UE support and Min physical assist to position right LE.  7/6: 30.5 sec, min A for LE positioning, some attempts did not come to full erect posture, did not let go with L UE from armchair with any attempt 05/05/22: 26.65 sec   Time 12    Period Weeks    Status Met    Target Date 05/03/22      PT LONG TERM GOAL #4   Title Patient will increase Berg Balance score by > 6 points to demonstrate decreased fall risk during functional activities.    Baseline 12 on 2/14; 02/08/2022= 13/56  04/07/22: 17  8/3: 19    Time 12    Period Weeks    Status Met    Target Date 05/03/22      PT LONG TERM GOAL #5   Title Pt will transfer from seated position on table/chair to/from transport chair/WC in order to indicate improved transfer ability.    Baseline Requires Min A for LE positioning and to prevent LOB. 02/08/2022=Patient attempted to perform on her own- Unable to push herself up from chair- Very min assist to position right LE into more flexed position and VC for hand placement. Patient able to stand with min Assist with use of gait belt and VC to lean; 02/17/2022=Patient was able to transfer from transport w/c with some physical assist with Right to swing to  pivot to side of chair. She was able to sit to stand today with only CGA and then stand pivot with right arm holding onto arm of chair. Only difficulty was stand to sit- Max VC not to plop into chair. 04/07/22: Pt performed movement of B LE to side of chair independently with chair transfer. Pt required min A with R LE foot placement of floor. Pt requires increased time to complete but complete with CGA only for safety and no cues or A to prevent falling/ plopping into armchair.  8/3: Pt requires min A for maintenance of standig balance, able to navigate from transport chairr to arm chair with hemiwalker, does not controll descent with ahands still " flops" into chair; 08/04/2022- Patient continues to be inconsistent in her transfers- at times only CGA and other more min Assist and repetitive VC. Unclear due to some ongoing cognitive issues if patient will achieve this goal but still presents with potential.    Time 12    Period Weeks    Status Partially met    Target Date 10/27/2022       PT LONG TERM GOAL #6   Title Patient will ambulate > 150 feet with CGA using Hemiwalker on level surfaces for improved household and short community distances.    Baseline 02/08/2022= 40 feet with use of HW, heavy CGA with max VC for gait sequencing and w/c follow. 5/18 = 85 feet with use of HW, close CGA  7/6: 45 feet, heavy use of hemiwalker 8/3: 85 feet with heavy hemi walker use, no Lob throughout, cues for appropriate weight distribution; 06/28/2022 - 205 feet with use of hemiwalker, CGA; 08/04/2022- Patient presents with inconsistent gait ability - able to walk 150 feet with max effort and increased time using hemiwalker and CGA.    Time 12    Period Weeks    Status On-going    Target Date 10/27/2022        PT LONG TERM GOAL #7  Title Pt will increase 10MWT by at least 0.13 m/s in order to demonstrate clinically significant improvement in community ambulation.    Baseline 02/08/2022= 0.05 m/s using HW 7/6:  .044ms with heavy ue use of hemiwalker and CGA for balance 8/3: .0589m 06/28/2022= 0.1 m/s (will keep goal active to ensure she is consistent); 08/04/2022- 0.09 m/s)   Time 12    Period Weeks    Status Ongoing     Target Date 10/27/2022      8.  Pt will improve 5X STS with full knee extension in standing each rep and with appropriate eccentric control in order to indicate improved transfer efficacy and improved LE strength  Baseline: 52.45 sec with UE support; 06/28/2022= 36.03 sec with UE support (VC for erect standing- patient continues to press up using back of legs against chair to stand); 08/04/2022- Patient required 45 sec using left UE and max VC (flopping at times with sitting)  Goal status: ONGOING TARGET date: 10/27/2022  9.  Patient will increase Berg Balance score by > 6 points to demonstrate decreased fall risk during functional activities Baseline: 19; 06/28/2022= 19/56; 08/04/2022- Deferred secondary to patient too fatigued to attempt after walking today. Will reassess next visit. Goal status: ONGOING Target Date: 10/27/2022           Plan -     Clinical Impression Statement Patient presents with good motivation for today's session. Continued with current plan of care as laid out in evaluation and recent prior sessions. Pt remains motivated to advance progress toward goals in order to maximize independence and safety at home. Pt requires high level assistance and cuing for completion of exercises in order to provide adequate level of stimulation challenge while minimizing pain and discomfort when possible.  Pt continues to demonstrate progress toward goals AEB progression of interventions this date either in volume or intensity.    Personal Factors and Comorbidities Age;Comorbidity 1;Comorbidity 2;Comorbidity 3+;Time since onset of injury/illness/exacerbation;Other    Comorbidities AFib, Renal Artery thrombosis, situational depression, HTN, Pyelnephritis, Seizure activity,  COnstipation, AKI    Examination-Activity Limitations Bathing;Bed Mobility;Carry;Locomotion Level;Dressing;Sit;Squat;Stairs;Stand;Toileting;Transfers    Examination-Participation Restrictions Church;Community Activity;Laundry    Stability/Clinical Decision Making Evolving/Moderate complexity    Rehab Potential Fair    PT Frequency 2x / week    PT Duration 12 weeks    PT Treatment/Interventions ADLs/Self Care Home Management;Gait training;Stair training;Therapeutic activities;Therapeutic exercise;Balance training;Neuromuscular re-education;Patient/family education;Wheelchair mobility training;Manual techniques;Energy conservation;Dry needling;Passive range of motion;Taping;Spinal Manipulations;Joint Manipulations    PT Next Visit Plan Gait training, Transfer training, LE strengthening as appropriate.    PT Home Exercise Plan HEP: general LE and postural exercises    Consulted and Agree with Plan of Care Patient            ChParticia LatherT   09/06/22, 12:39 PM

## 2022-09-06 NOTE — Therapy (Signed)
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 ([place]) 09/24/2021   Cerebral embolism with cerebral infarction 09/23/2021   Pyelonephritis 09/19/2021   Pain due to onychomycosis of toenails of both feet 11/19/2020   Normocytic anemia 05/31/2020   Acute ischemic left MCA stroke ([place]) 04/30/2020   Right hemiplegia ([place]) 04/30/2020   Cerebrovascular accident ([place]) 04/21/2020   Atrial fibrillation with RVR ([place]) 04/21/2020   Essential hypertension 04/21/2020   Alcohol abuse 04/21/2020    RUE Measurements (Taken 04/04/22):   PROM Shoulder flexion:  7/03: 0(108)  5/18: 0(105)  PROM Shoulder  abduction: 7/03: [id number])  5/18: 0(102)  PROM Elbow:   06/28/2022: 0(0-140) 7/03: 0(0-140)  5/18: 0(0-136)  AROM Wrist extension:  06/28/2022: -18/(72) 7/03: -28(72)  5/18: -40(72)  REFERRING DIAG: CVA  THERAPY DIAG:  Muscle weakness (generalized)  Other lack of coordination  Rationale for Evaluation and Treatment Rehabilitation  PERTINENT HISTORY: Pt. is a 66 y.o. 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 Pt. has Right sided weakness, and receptive, and expressive aphasia. Pt. has had a previous CVA with right sided weakness in July 2021. PMHx includes: AFib, Renal Artery thrombosis, situational depression, HTN, Pyelnephritis, Seizure activity, COnstipation, AKI. Vitamin D deficiency, and urinary incontinence/  PRECAUTIONS: Fall   SUBJECTIVE:  Pt. reports that her grandson brought her to therapy today.   PAIN:  Are you having pain? No   OBJECTIVE:   OT TREATMENT  Manual Therapy:   Pt. tolerated soft tissue massage to the right scapular musculature. Pt. tolerated scapular mobilizations in elevation, depression, abduction, and rotation following moist heat modality to the right shoulder. Manual therapy was performed independent of, and in preparation for there. Ex. Pt. education was provided about soft tissue massage to the volar, and dorsal aspect of the forearm.    Therapeutic Exercise:   Pt. tolerated PROM through all joint ranges of the RUE, and hand following moist heat modality, and manual therapy. Pt. worked on  reps of active right wrist extension, and right supination with support proximally. Pt. education was provided about opportunities to engage the RUE during daily tasks, and IADL tasks.     Pt. was late for therapy this morning. Pt. continues to require cues, and assist for techniques during soft tissue massage to the volar, dorsal surface of the forearm. Pt. continues to present with  increased tone, and tightness in the RUE. Pt. reports limited UE ROM at home. Pt. continues to work on normalizing tone, and facilitating active volitional movement in the RUE, and hand to improve engagement of the RUE during ADLs, as well as improve transfers, and functional mobility for toileting.    PATIENT EDUCATION: Education details: dressing techniques, HEP Person educated: Patient Education method: Medical illustrator Education comprehension: verbalized understanding, returned demonstration, verbal cues required, and tactile cues required   HOME EXERCISE PROGRAM   Continue with ongoing HEPs for ROM to the RUE, hand, and digits.   OT SHORT TERM GOAL #1     Title Pt and caregiver will be independent with HEP for RUE     Baseline Eval: No current program. 10th visit: Caregivers  report being comfortable with exercises at home. 01/06/2022: Continue ongoing HEPs for UE. 30th visit: Pt./caregivers to continue with ROM; 40th: pt/caregivers to continue with PROM throughout RUE 05/16/2022:  Continue ongoing pt./caregiver education about HEPs for the RUE.06/28/2022: Pt. Reports consistency has been limited. 07/14/2022: Consistency with UE ROM at home has been limited.    Time 6    Period Weeks     Status On-going     Target Date 08/09/2022                    OT Long Term Goals -                OT LONG TERM GOAL #1    Title Pt. will engage the RUE as a gross assist/atabilizer during ADLs 100% of the time     Baseline 07/14/2022: Pt. Continues to try to engage her RUE as a gross stabilizer. 06/28/2022: Pt. continues to try to engage her RUE as a gross stabilizer. Continues to stabilize the loofa 05/16/2022: Pt. Is able to place items, in her right hand, and use her right hand  to stabilize various sizes of bottles while using her left hand to open them. 04/04/2022: Pt. Is using her right hand to hold the loofa, and stabilize items. 30th visit: Pt. is initiating using her RUE as a  gross stabilizer with maxA 01/06/2022: Pt. is starting to engage her RUE as a gross stabiliizer with increased cues. Eval: pt. does not currently engage her RUE during ADLs. 10th visit: Pt. continues to need to work towards using her RUE as a gross stabilizer.     Time 12     Status On-going     Target Date 09/20/2022         OT LONG TERM GOAL #2    Title Pt. will perform UE dressing using one armed dressing techniques     Baseline Eval: MaxA, 10th visit: ModA 01/06/2022: min-modA 30th visit; min0-modA, 04/04/2022: minA donning loose shirts, MaxA tighter items/sports bra, independent doffing a shirt and bra 05/16/2022: minA donning loose shirts, MaxA tighter items/sports bra, independent doffing a shirt and bra. 06/28/2022: minA donning, and doffing loose T-shirts. 07/14/2022: minA donning loose shirts, and maxA tighter fitting clothing.     Time 12     Period Weeks  Status On-going     Target Date 09/20/2022         OT LONG TERM GOAL #3    Title Pt. will require minA to stand and hike pants     Baseline Eval: MaxA, 10th visit: [place] 01/06/2022: [place] 30th visit: ModA 04/04/2022: independent with elastic pants if above the knees. MaxA if they fall below the knee; 04/07/22: min guard-min A to reach below knees to simulate hiking pants 05/16/2022: Min guard-MinA reaching below the knee to hike pants.06/28/2022: Min-CGA reaching to hike pants below the knees. 07/14/2022:    Time 12     Period Weeks     Status On-going     Target Date 09/30/2022         OT LONG TERM GOAL #4    Title Pt. will perform LE dressing with minA     Baseline Eval: MaxA 10th visit: Pt. requires MaxA for donning her brace, shoes, and socks. ModA with pants. 01/06/2022: ModA pants, MaxA shoes, socks, and brace 30th visit: ModA pants, MaxA shoes, socks, and brace. 04/04/2022: MaxA; 04/07/22: pt can don/doff L sock and shoe with supv, dep for RLE; see above for hiking pants 05/16/2022:  Donning and doffing the Left sock and shoe with  supervision. Total Assist for the RLE.06/28/2022: Supervision donning left shoe, and sock, Dep. For RLE. 07/14/2022: Supervision donning left shoe and  sock, and Dependent for RLE.    Time 12     Period Weeks     Status On-going     Target Date 09/20/22          OT LONG TERM GOAL #5    Title Pt. will improve FOTO score by 2 pt.s to reflect funcational change     Baseline Eval: FOTO score: 12, 10th visit: FOTO score 27 01/06/2022: 27 30th visit TBA at next visit secondary to no family available during the session; 04/04/22: FOTO 40 05/16/2022: 27 07/14/2022: 25    Time 12     Period Weeks     Status On-going     Target Date 09/20/22          OT LONG TERM GOAL #6    Title Patient will transfer to bedside commode or toilet with supervision.     Baseline 30th visit: min-modA. 01/06/2022: min-ModA 04/04/2022: Min-ModA 05/16/2022: min-modA 06/28/2022: min-ModA 07/14/2022: min-modA    Time 4     Period Weeks     Status On-going     Target Date 09/20/22         OT LONG TERM GOAL #7                                                      Title: Pt. Will improve AROM wrist extension by 5 degrees in preparation for improving functional  reaching.                                                                                  Baseline:       7/03: -  28(72) 06/28/2022: -18(72) 07/14/2022: -18(58)            Time: 4           Period: Weeks           Status: Ongoing           Target Date: 09/20/2022   Plan - 03/24/22 1429     Clinical Impression Statement Pt. was late for Pt. was late for therapy this morning. Pt. continues to require cues, and assist for techniques during soft tissue massage to the volar, dorsal surface of the forearm. Pt. continues to present with increased tone, and tightness in the RUE. Pt. reports limited UE ROM at home. Pt. continues to work on normalizing tone, and facilitating active volitional movement in the RUE, and hand to improve engagement of the RUE during ADLs, as well as  improve transfers, and functional mobility for toileting.                 OT Occupational Profile and History Detailed Assessment- Review of Records and additional review of physical, cognitive, psychosocial history related to current functional performance    Occupational performance deficits (Please refer to evaluation for details): Leisure;IADL's;ADL's    Body Structure / Function / Physical Skills ADL;IADL;FMC;ROM;UE functional use;Strength;Decreased knowledge of use of DME;Coordination    Rehab Potential Fair    Clinical Decision Making Several treatment options, min-mod task modification necessary    Comorbidities Affecting Occupational Performance: May have comorbidities impacting occupational performance    Modification or Assistance to Complete Evaluation  Min-Moderate modification of tasks or assist with assess necessary to complete eval    OT Frequency 2x / week    OT Duration 12 weeks    OT Treatment/Interventions Self-care/ADL training;Neuromuscular education;Electrical Stimulation;Patient/family education;Therapeutic activities;Passive range of motion;Therapist, nutritional;Therapeutic exercise;DME and/or AE instruction;Moist Heat    Consulted and Agree with Plan of Care Patient             Harrel Carina, MS, OTR/L  Harrel Carina, OT 08/18/2022, 11:17 AM

## 2022-09-08 ENCOUNTER — Ambulatory Visit: Payer: Medicare Other | Admitting: Occupational Therapy

## 2022-09-08 ENCOUNTER — Ambulatory Visit: Payer: Medicare Other

## 2022-09-08 ENCOUNTER — Encounter: Payer: Medicare Other | Admitting: Speech Pathology

## 2022-09-08 DIAGNOSIS — R2689 Other abnormalities of gait and mobility: Secondary | ICD-10-CM

## 2022-09-08 DIAGNOSIS — M6281 Muscle weakness (generalized): Secondary | ICD-10-CM

## 2022-09-08 DIAGNOSIS — R262 Difficulty in walking, not elsewhere classified: Secondary | ICD-10-CM

## 2022-09-08 DIAGNOSIS — R2681 Unsteadiness on feet: Secondary | ICD-10-CM

## 2022-09-08 DIAGNOSIS — I63542 Cerebral infarction due to unspecified occlusion or stenosis of left cerebellar artery: Secondary | ICD-10-CM

## 2022-09-08 DIAGNOSIS — R278 Other lack of coordination: Secondary | ICD-10-CM

## 2022-09-08 DIAGNOSIS — R269 Unspecified abnormalities of gait and mobility: Secondary | ICD-10-CM

## 2022-09-08 DIAGNOSIS — I63512 Cerebral infarction due to unspecified occlusion or stenosis of left middle cerebral artery: Secondary | ICD-10-CM

## 2022-09-08 NOTE — Therapy (Addendum)
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) 09/24/2021   Cerebral embolism with cerebral infarction 09/23/2021   Pyelonephritis 09/19/2021   Pain due to onychomycosis of toenails of both feet 11/19/2020   Normocytic anemia 05/31/2020   Acute ischemic left MCA stroke (HCC) 04/30/2020   Right hemiplegia (HCC) 04/30/2020   Cerebrovascular accident (HCC) 04/21/2020   Atrial fibrillation with RVR (HCC) 04/21/2020   Essential hypertension 04/21/2020   Alcohol abuse 04/21/2020    RUE Measurements (Taken 04/04/22):   PROM Shoulder flexion:  7/03: 0(108)  5/18: 0(105)  PROM Shoulder  abduction: 7/03: 8(657)  5/18: 0(102)  PROM Elbow:   06/28/2022: 0(0-140) 7/03: 0(0-140)  5/18: 0(0-136)  AROM Wrist extension:  06/28/2022: -18/(72) 7/03: -28(72)  5/18: -40(72)  REFERRING DIAG: CVA  THERAPY DIAG:  Muscle weakness (generalized)  Rationale for Evaluation and Treatment Rehabilitation  PERTINENT HISTORY: Pt. is a 66 y.o. 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 Pt. has Right sided weakness, and receptive, and expressive aphasia. Pt. has had a previous CVA with right sided weakness in July 2021. PMHx includes: AFib, Renal Artery thrombosis, situational depression, HTN, Pyelnephritis, Seizure activity, COnstipation, AKI. Vitamin D deficiency, and urinary incontinence/  PRECAUTIONS: Fall   SUBJECTIVE:  Pt. Reports feeling okay today.   PAIN:  Are you having pain? No   OBJECTIVE:   OT TREATMENT  Manual Therapy:   Pt. tolerated soft tissue massage to the right scapular musculature. Pt. tolerated scapular mobilizations in elevation, depression, abduction, and rotation following moist heat modality to the right shoulder. Manual therapy was performed independent of, and in preparation for there. Ex. Pt. education was provided about soft tissue massage to the volar, and dorsal aspect of the forearm.    Therapeutic Exercise:   Pt. tolerated PROM through all joint ranges of the RUE, and hand following moist heat modality, and  manual therapy. Pt. worked on reps of active right wrist extension, and right supination with support proximally. Pt. education was provided about opportunities to engage the RUE during daily tasks, and IADL tasks.      Pt. Continues to present with no consistent activation of volitional movement in the right hand secondary to increased tone, and tightness in the RUE. Pt. reports limited UE ROM at home. Pt. Requires maxA donning a brace. Pt. continues to work on normalizing  tone, and facilitating active volitional movement in the RUE, and hand to improve engagement of the RUE during ADLs, as well as improve transfers, and functional mobility for toileting.    PATIENT EDUCATION: Education details: dressing techniques, HEP Person educated: Patient Education method: Customer service manager Education comprehension: verbalized understanding, returned demonstration, verbal cues required, and tactile cues required   [place] with ongoing HEPs for ROM to the RUE, hand, and digits.   OT SHORT TERM GOAL #1     Title Pt and caregiver will be independent with HEP for RUE     Baseline Eval: No current program. 10th visit: Caregivers  report being comfortable with exercises at home. 01/06/2022: Continue ongoing HEPs for UE. 30th visit: Pt./caregivers to continue with ROM; 40th: pt/caregivers to continue with PROM throughout RUE 05/16/2022:  Continue ongoing pt./caregiver education about HEPs for the RUE.06/28/2022: Pt. Reports consistency has been limited. 07/14/2022: Consistency with UE ROM at home has been limited.    Time 6    Period Weeks     Status On-going     Target Date 08/09/2022                    OT Long Term Goals -                OT LONG TERM GOAL #1    Title Pt. will engage the RUE as a gross assist/atabilizer during ADLs 100% of the time     Baseline 07/14/2022: Pt. Continues to try to engage her RUE as a gross stabilizer. 06/28/2022: Pt. continues to try to engage her RUE as a gross stabilizer. Continues to stabilize the loofa 05/16/2022: Pt. Is able to place items, in her right hand, and use her right hand  to stabilize various sizes of bottles while using her left hand to open them. 04/04/2022: Pt. Is using her right hand to hold the loofa, and stabilize items. 30th visit: Pt. is initiating using her RUE as a gross stabilizer with maxA 01/06/2022: Pt. is starting to engage her RUE as a gross stabiliizer with increased cues.  Eval: pt. does not currently engage her RUE during ADLs. 10th visit: Pt. continues to need to work towards using her RUE as a gross stabilizer.     Time 12     Status On-going     Target Date 09/20/2022         OT LONG TERM GOAL #2    Title Pt. will perform UE dressing using one armed dressing techniques     Baseline Eval: MaxA, 10th visit: [place] 01/06/2022: min-modA 30th visit; min0-modA, 04/04/2022: minA donning loose shirts, MaxA tighter items/sports bra, independent doffing a shirt and bra 05/16/2022: minA donning loose shirts, MaxA tighter items/sports bra, independent doffing a shirt and bra. 06/28/2022: minA donning, and doffing loose T-shirts. 07/14/2022: minA donning loose shirts, and maxA tighter fitting clothing.     Time 12     Period Weeks     Status On-going  Target Date 09/20/2022         OT LONG TERM GOAL #3    Title Pt. will require minA to stand and hike pants     Baseline Eval: MaxA, 10th visit: [place] 01/06/2022: [place] 30th visit: ModA 04/04/2022: independent with elastic pants if above the knees. MaxA if they fall below the knee; 04/07/22: min guard-min A to reach below knees to simulate hiking pants 05/16/2022: Min guard-MinA reaching below the knee to hike pants.06/28/2022: Min-CGA reaching to hike pants below the knees. 07/14/2022:    Time 12     Period Weeks     Status On-going     Target Date 09/30/2022         OT LONG TERM GOAL #4    Title Pt. will perform LE dressing with minA     Baseline Eval: MaxA 10th visit: Pt. requires MaxA for donning her brace, shoes, and socks. ModA with pants. 01/06/2022: ModA pants, MaxA shoes, socks, and brace 30th visit: ModA pants, MaxA shoes, socks, and brace. 04/04/2022: MaxA; 04/07/22: pt can don/doff L sock and shoe with supv, dep for RLE; see above for hiking pants 05/16/2022:  Donning and doffing the Left sock and shoe with supervision. Total Assist for the RLE.06/28/2022: Supervision donning left shoe, and sock, Dep. For RLE. 07/14/2022:  Supervision donning left shoe and  sock, and Dependent for RLE.    Time 12     Period Weeks     Status On-going     Target Date 09/20/22          OT LONG TERM GOAL #5    Title Pt. will improve FOTO score by 2 pt.s to reflect funcational change     Baseline Eval: FOTO score: 12, 10th visit: FOTO score 27 01/06/2022: 27 30th visit TBA at next visit secondary to no family available during the session; 04/04/22: FOTO 40 05/16/2022: 27 07/14/2022: 25    Time 12     Period Weeks     Status On-going     Target Date 09/20/22          OT LONG TERM GOAL #6    Title Patient will transfer to bedside commode or toilet with supervision.     Baseline 30th visit: min-modA. 01/06/2022: min-ModA 04/04/2022: Min-ModA 05/16/2022: min-modA 06/28/2022: min-ModA 07/14/2022: min-modA    Time 4     Period Weeks     Status On-going     Target Date 09/20/22         OT LONG TERM GOAL #7                                                      Title: Pt. Will improve AROM wrist extension by 5 degrees in preparation for improving functional  reaching.                                                                                  Baseline:       7/03: -28(72) 06/28/2022: -18(72) 07/14/2022: -18(58)  Time: 4           Period: Weeks           Status: Ongoing           Target Date: 09/20/2022   Plan - 03/24/22 1429     Clinical Impression Statement Pt. continues to present with no consistent activation of volitional movement in the right hand secondary to increased tone, and tightness in the RUE. Pt. reports limited UE ROM at home. Pt. Requires maxA donning a brace. Pt. continues to work on normalizing tone, and facilitating active volitional movement in the RUE, and hand to improve engagement of the RUE during ADLs, as well as improve transfers, and functional mobility for toileting.                 OT Occupational Profile and History Detailed Assessment- Review of Records and additional review of physical,  cognitive, psychosocial history related to current functional performance    Occupational performance deficits (Please refer to evaluation for details): Leisure;IADL's;ADL's    Body Structure / Function / Physical Skills ADL;IADL;FMC;ROM;UE functional use;Strength;Decreased knowledge of use of DME;Coordination    Rehab Potential Fair    Clinical Decision Making Several treatment options, min-mod task modification necessary    Comorbidities Affecting Occupational Performance: May have comorbidities impacting occupational performance    Modification or Assistance to Complete Evaluation  Min-Moderate modification of tasks or assist with assess necessary to complete eval    OT Frequency 2x / week    OT Duration 12 weeks    OT Treatment/Interventions Self-care/ADL training;Neuromuscular education;Electrical Stimulation;Patient/family education;Therapeutic activities;Passive range of motion;Therapist, nutritional;Therapeutic exercise;DME and/or AE instruction;Moist Heat    Consulted and Agree with Plan of Care Patient             Harrel Carina, MS, OTR/L  Harrel Carina, OT 09/08/2022, 1:38 PM

## 2022-09-08 NOTE — Therapy (Signed)
OUTPATIENT PHYSICAL THERAPY TREATMENT NOTE/Physical Therapy Progress Note   Dates of reporting period  06/28/2022   to   09/08/2022  Patient Name: Lindsey [name] MRN: [id number] DOB:[birth year], 66 y.o., female Today's Date: 09/09/2022  PCP: Dr. Tomasa Hose MD REFERRING PROVIDER: Leeroy Cha MD   PT End of Session - 09/08/22 0748     Visit Number 60    Number of Visits 58    Date for PT Re-Evaluation 10/27/22    Authorization Type Medicare    Authorization Time Period Cert through 05/05/40; Recert 06/08/2228-04/10/8920; Recert 10/11/4172-08/14; Recert 48/10/8561-1/49/7026    Progress Note Due on Visit 54    PT Start Time 1100    PT Stop Time 1142    PT Time Calculation (min) 42 min    Equipment Utilized During Treatment Gait belt    Activity Tolerance Patient tolerated treatment well    Behavior During Therapy WFL for tasks assessed/performed                          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 No pertinent surgical history. 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 ([place]) 09/24/2021   Cerebral embolism with cerebral infarction 09/23/2021   Pyelonephritis 09/19/2021   Pain due to onychomycosis of toenails of both feet 11/19/2020   Normocytic anemia 05/31/2020   Acute  ischemic left MCA stroke ([place]) 04/30/2020   Right hemiplegia ([place]) 04/30/2020   Cerebrovascular accident ([place]) 04/21/2020   Atrial fibrillation with RVR ([place]) 04/21/2020   Essential hypertension 04/21/2020   Alcohol abuse 04/21/2020    REFERRING DIAG: s/p CVA  THERAPY DIAG:  Muscle weakness (generalized)  Unsteadiness on feet  Abnormality of gait and mobility  Difficulty in walking, not elsewhere classified  Acute ischemic left MCA stroke (HCC)  Other lack of coordination  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 At this point she is limited to wheelchair as her main means of mobility. Pt caregiver reports all detailed information. Pt has hesitation with the left lower extremity and significant weakness on the right lower extremity. Pt will take 2-3 steps when transfering. Pt caregiver assists with all transfers.Pt has shower with ledge to steps over ledge when entering and utilizes bedside commode. Pt has 2 level home with chair lift for upstairs access. Pt would like to improve ransfers, improve neglect on her R side and improve her overall strength and mobility.Pt. is a 66 y.o. female who was diagnosed with a Cerebral Infarction  secondary to stenosis of the left cerebellar artery on 09/19/2021. Pt. has Right sided weakness, and receptive, and expressive aphasia. Pt. has had a previous CVA with right sided weakness in July 2021. PMHx includes: AFib, Renal Artery thrombosis, situational depression, HTN, Pyelnephritis, Seizure activity, COnstipation, AKI. Vitamin D deficiency, and urinary incontinence.   PRECAUTIONS: fall risk, wearing RLE AFO  SUBJECTIVE:  Patient reports no changes since last visit. Reports  tired from OT session just prior to PT visit.   PAIN:  Are you having pain? No      TODAY'S TREATMENT:  09/09/22     INTERVENTIONS:   THEREX:   STS transfer from transport chair to standard arm chair with mod A-max A from PT for safety and for LE movement and control   PROM to right knee into flex/ext 2*10 AROM on L 2 x 10  AROM hip march on left x 20 reps (VC to perform slowly with improved motor control)  AAROM hip march on right x 20 reps Pyramid quad strengthening-  No weight on right and 2# on left x 12 reps 2# right LE and 2.5 # on left LE x 10 reps 2.5 # Right LE and 3# on left LE x 8 reps 3# Right LE and 5# on right Lx 6 reps       Sit to stand with CGA- VC for scoot forward and to lean trunk forward x 5 reps. Less cues with each repetition.   Ambulation 80 feet with (3# on right and 5# on left) using hemiwalker.   Ball adductor squeeze, Max A with limiting L LE movement in order to isolate R LE adduction  3 x 10 reps   STS transfer from chair to transport chair with mod A from PT for safety  PATIENT EDUCATION: Education details: Pt educated throughout session about proper posture and technique with exercises. Improved exercise technique, movement at target joints, use of target muscles after min to mod verbal, visual, tactile cues. Continued education on technique with STS.  Person educated: Patient Education method: Explanation, Demonstration, and Verbal cues Education comprehension: verbalized understanding, returned demonstration, and needs further education   HOME EXERCISE PROGRAM: Continue as previously given;    PT Short Term Goals -       PT SHORT TERM GOAL #1   Title Patient will be independent in home exercise program to improve strength/mobility for better functional independence with ADLs.    Baseline no formal HEp for LE/balance at this time; 02/08/2022=Patient continues to require VC for reminders and assist with hip march/knee ext  (seated for strengthening); Reminders to continue to work on static stand at home.    Time 4    Period Weeks    Status Partially Met  04/07/22: complete somewhat regularly     Target Date 12/14/21              PT Long Term Goals -       PT LONG TERM GOAL #1   Title Patient will increase FOTO score to equal to or greater than   41  to demonstrate statistically significant improvement in mobility and quality of life.    Baseline 16 on 2/14; 02/08/2022=Patient unable to complete today- No family present but will attempt again next visit available with family present to assist in completion; 02/17/2022= 40; 06/28/2022- No caregiver present in PT session to assist in answering questions. 08/04/2022= 40    Time 12    Period Weeks    Status  On-going    Target Date 10/27/2022       PT LONG TERM GOAL #3   Title Patient  will complete five times sit to stand test in < 30 seconds with UE assistance indicating an increased LE strength and improved balance.    Baseline 58.47 s with UE assist and with A from PT for R LE positioning; 02/08/2022=54 sec with use of L UE support and Min physical assist to position right LE.  7/6: 30.5 sec, min A for LE positioning, some attempts did not come to full erect posture, did not let go with L UE from armchair with any attempt 05/05/22: 26.65 sec   Time 12    Period Weeks    Status Met    Target Date 05/03/22      PT LONG TERM GOAL #4   Title Patient will increase Berg Balance score by > 6 points to demonstrate decreased fall risk during functional activities.    Baseline 12 on 2/14; 02/08/2022= 13/56  04/07/22: 17  8/3: 19    Time 12    Period Weeks    Status Met    Target Date 05/03/22      PT LONG TERM GOAL #5   Title Pt will transfer from seated position on table/chair to/from transport chair/WC in order to indicate improved transfer ability.    Baseline Requires Min A for LE positioning and to prevent LOB. 02/08/2022=Patient attempted to perform on her  own- Unable to push herself up from chair- Very min assist to position right LE into more flexed position and VC for hand placement. Patient able to stand with min Assist with use of gait belt and VC to lean; 02/17/2022=Patient was able to transfer from transport w/c with some physical assist with Right to swing to pivot to side of chair. She was able to sit to stand today with only CGA and then stand pivot with right arm holding onto arm of chair. Only difficulty was stand to sit- Max VC not to plop into chair. 04/07/22: Pt performed movement of B LE to side of chair independently with chair transfer. Pt required min A with R LE foot placement of floor. Pt requires increased time to complete but complete with CGA only for safety and no cues or A to prevent falling/ plopping into armchair.  8/3: Pt requires min A for maintenance of standig balance, able to navigate from transport chairr to arm chair with hemiwalker, does not controll descent with ahands still " flops" into chair; 08/04/2022- Patient continues to be inconsistent in her transfers- at times only CGA and other more min Assist and repetitive VC. Unclear due to some ongoing cognitive issues if patient will achieve this goal but still presents with potential. 09/08/2022- Patient able to stand with CGA yet continues to require initial VC for proper sequencing.   Time 12    Period Weeks    Status Partially met    Target Date 10/27/2022       PT LONG TERM GOAL #6   Title Patient will ambulate > 150 feet with CGA using Hemiwalker on level surfaces for improved household and short community distances.    Baseline 02/08/2022= 40 feet with use of HW, heavy CGA with max VC for gait sequencing and w/c follow. 5/18 = 85 feet with use of HW, close CGA  7/6: 45 feet, heavy use of hemiwalker 8/3: 85 feet with heavy hemi walker use, no Lob throughout, cues for appropriate weight distribution; 06/28/2022 -  205 feet with use of hemiwalker, CGA; 08/04/2022- Patient  presents with inconsistent gait ability - able to walk 150 feet with max effort and increased time using hemiwalker and CGA.    Time 12    Period Weeks    Status On-going    Target Date 10/27/2022        PT LONG TERM GOAL #7   Title Pt will increase 10MWT by at least 0.13 m/s in order to demonstrate clinically significant improvement in community ambulation.    Baseline 02/08/2022= 0.05 m/s using HW 7/6: .050ms with heavy ue use of hemiwalker and CGA for balance 8/3: .0552m 06/28/2022= 0.1 m/s (will keep goal active to ensure she is consistent); 08/04/2022- 0.09 m/s)   Time 12    Period Weeks    Status Ongoing     Target Date 10/27/2022      8.  Pt will improve 5X STS with full knee extension in standing each rep and with appropriate eccentric control in order to indicate improved transfer efficacy and improved LE strength  Baseline: 52.45 sec with UE support; 06/28/2022= 36.03 sec with UE support (VC for erect standing- patient continues to press up using back of legs against chair to stand); 08/04/2022- Patient required 45 sec using left UE and max VC (flopping at times with sitting)  Goal status: ONGOING TARGET date: 10/27/2022  9.  Patient will increase Berg Balance score by > 6 points to demonstrate decreased fall risk during functional activities Baseline: 19; 06/28/2022= 19/56; 08/04/2022- Deferred secondary to patient too fatigued to attempt after walking today. Will reassess next visit. Goal status: ONGOING Target Date: 10/27/2022           Plan -     Clinical Impression Statement Patient presents with good motivation for progress visit. Continuing to follow POC- for LE strengthening, safe transfers, and gait/mobility. She has made progress- able to swing her right leg better with ambulation and able to perform some resistive walking today. She is still inconsistent with transfer ability but overall less physical assist and less VC for proper sequencing. Patient's condition has the  potential to improve in response to therapy. Maximum improvement is yet to be obtained. The anticipated improvement is attainable and reasonable in a generally predictable time.  Pt continues to demonstrate progress toward goals AEB progression of interventions this date either in volume or intensity.    Personal Factors and Comorbidities Age;Comorbidity 1;Comorbidity 2;Comorbidity 3+;Time since onset of injury/illness/exacerbation;Other    Comorbidities AFib, Renal Artery thrombosis, situational depression, HTN, Pyelnephritis, Seizure activity, COnstipation, AKI    Examination-Activity Limitations Bathing;Bed Mobility;Carry;Locomotion Level;Dressing;Sit;Squat;Stairs;Stand;Toileting;Transfers    Examination-Participation Restrictions Church;Community Activity;Laundry    Stability/Clinical Decision Making Evolving/Moderate complexity    Rehab Potential Fair    PT Frequency 2x / week    PT Duration 12 weeks    PT Treatment/Interventions ADLs/Self Care Home Management;Gait training;Stair training;Therapeutic activities;Therapeutic exercise;Balance training;Neuromuscular re-education;Patient/family education;Wheelchair mobility training;Manual techniques;Energy conservation;Dry needling;Passive range of motion;Taping;Spinal Manipulations;Joint Manipulations    PT Next Visit Plan Gait training, Transfer training, LE strengthening as appropriate.    PT Home Exercise Plan HEP: general LE and postural exercises    Consulted and Agree with Plan of Care Patient            JeLewis MoccasinT   09/09/22, 7:50 AM

## 2022-09-13 ENCOUNTER — Encounter: Payer: Medicare Other | Admitting: Speech Pathology

## 2022-09-13 ENCOUNTER — Encounter: Payer: Self-pay | Admitting: Occupational Therapy

## 2022-09-13 ENCOUNTER — Ambulatory Visit: Payer: Medicare Other

## 2022-09-13 ENCOUNTER — Ambulatory Visit: Payer: Medicare Other | Admitting: Occupational Therapy

## 2022-09-13 DIAGNOSIS — I63542 Cerebral infarction due to unspecified occlusion or stenosis of left cerebellar artery: Secondary | ICD-10-CM

## 2022-09-13 DIAGNOSIS — I63512 Cerebral infarction due to unspecified occlusion or stenosis of left middle cerebral artery: Secondary | ICD-10-CM

## 2022-09-13 DIAGNOSIS — R2681 Unsteadiness on feet: Secondary | ICD-10-CM

## 2022-09-13 DIAGNOSIS — R262 Difficulty in walking, not elsewhere classified: Secondary | ICD-10-CM

## 2022-09-13 DIAGNOSIS — M6281 Muscle weakness (generalized): Secondary | ICD-10-CM

## 2022-09-13 DIAGNOSIS — R278 Other lack of coordination: Secondary | ICD-10-CM

## 2022-09-13 DIAGNOSIS — R2689 Other abnormalities of gait and mobility: Secondary | ICD-10-CM

## 2022-09-13 DIAGNOSIS — R269 Unspecified abnormalities of gait and mobility: Secondary | ICD-10-CM

## 2022-09-13 NOTE — Therapy (Signed)
OUTPATIENT PHYSICAL THERAPY TREATMENT NOTE Patient Name: Lindsey [name] MRN: [id number] DOB:29-Feb-1956, 66 y.o., female Today's Date: 09/13/2022  PCP: Dr. Tomasa Hose MD REFERRING PROVIDER: Leeroy Cha MD   PT End of Session - 09/13/22 1052     Visit Number 61    Number of Visits 6    Date for PT Re-Evaluation 10/27/22    Authorization Type Medicare    Authorization Time Period Cert through 10/05/43; Recert 8/0/9983-12/09/2503; Recert 12/10/7671-41/93; Recert 79/0/2409-7/35/3299    Progress Note Due on Visit 46    PT Start Time 1054    PT Stop Time 1134    PT Time Calculation (min) 40 min    Equipment Utilized During Treatment Gait belt    Activity Tolerance Patient tolerated treatment well    Behavior During Therapy WFL for tasks assessed/performed                          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 No pertinent surgical history. 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 ([place]) 09/24/2021   Cerebral embolism with cerebral infarction 09/23/2021   Pyelonephritis 09/19/2021   Pain due to onychomycosis of toenails of both feet 11/19/2020   Normocytic anemia 05/31/2020   Acute ischemic left MCA stroke ([place]) 04/30/2020   Right hemiplegia ([place]) 04/30/2020    Cerebrovascular accident ([place]) 04/21/2020   Atrial fibrillation with RVR ([place]) 04/21/2020   Essential hypertension 04/21/2020   Alcohol abuse 04/21/2020    REFERRING DIAG: s/p CVA  THERAPY DIAG:  Muscle weakness (generalized)  Unsteadiness on feet  Abnormality of gait and mobility  Difficulty in walking, not elsewhere classified  Acute ischemic left MCA stroke (HCC)  Other lack of coordination  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 At this point she is limited to wheelchair as her main means of mobility. Pt caregiver reports all detailed information. Pt has hesitation with the left lower extremity and significant weakness on the right lower extremity. Pt will take 2-3 steps when transfering. Pt caregiver assists with all transfers.Pt has shower with ledge to steps over ledge when entering and utilizes bedside commode. Pt has 2 level home with chair lift for upstairs access. Pt would like to improve ransfers, improve neglect on her R side and improve her overall strength and mobility.Pt. is a 66 y.o. 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 Pt. has Right sided weakness, and receptive, and  expressive aphasia. Pt. has had a previous CVA with right sided weakness in July 2021. PMHx includes: AFib, Renal Artery thrombosis, situational depression, HTN, Pyelnephritis, Seizure activity, COnstipation, AKI. Vitamin D deficiency, and urinary incontinence.   PRECAUTIONS: fall risk, wearing RLE AFO  SUBJECTIVE:  Patient reports very sore in right LE from walking in clinic last and some at home. Defers walking pointing to her lateral thigh as  painful- did not rate  PAIN:  Are you having pain? No      TODAY'S TREATMENT:  09/13/22     INTERVENTIONS:   THEREX:   STS transfer from transport chair to standard arm chair with mod A-max A from PT for safety and for LE movement and control   PROM to right knee into flex/ext 2*10 AROM on L 2 x 10  AROM hip march on left x 12 reps following by pyramid Strengthening - 10 reps with 2.5#; 8 reps with 3# (VC to perform slowly with improved motor control)  AAROM hip march on right x 12 reps x 3 sets Pyramid quad strengthening-  No weight on right and 2# on left x 12 reps 2# right LE and 2.5 # on left LE x 10 reps 2.5 # Right LE and 3# on left LE x 8 reps 3# Right LE and 5# on right Lx 6 reps  Hip flex/abd up over hedgehog x 12 reps x 2 sets each LE  Patient attempted stand pivot transfer with increased overall difficulty coordinating movement and moving right LE- Requiring increased overall time to perform.         PATIENT EDUCATION: Education details: Pt educated throughout session about proper posture and technique with exercises. Improved exercise technique, movement at target joints, use of target muscles after min to mod verbal, visual, tactile cues. Continued education on technique with STS.  Person educated: Patient Education method: Explanation, Demonstration, and Verbal cues Education comprehension: verbalized understanding, returned demonstration, and needs further education   HOME EXERCISE PROGRAM: Continue as previously given;    PT Short Term Goals -       PT SHORT TERM GOAL #1   Title Patient will be independent in home exercise program to improve strength/mobility for better functional independence with ADLs.    Baseline no formal HEp for LE/balance at this time; 02/08/2022=Patient continues to require VC for reminders and assist with hip march/knee ext (seated for strengthening); Reminders to continue to work on static stand at home.    Time 4     Period Weeks    Status Partially Met  04/07/22: complete somewhat regularly     Target Date 12/14/21              PT Long Term Goals -       PT LONG TERM GOAL #1   Title Patient will increase FOTO score to equal to or greater than   41  to demonstrate statistically significant improvement in mobility and quality of life.    Baseline 16 on 2/14; 02/08/2022=Patient unable to complete today- No family present but will attempt again next visit available with family present to assist in completion; 02/17/2022= 40; 06/28/2022- No caregiver present in PT session to assist in answering questions. 08/04/2022= 40    Time 12    Period Weeks    Status On-going    Target Date 10/27/2022       PT LONG TERM GOAL #3   Title Patient  will complete five times sit to stand test in <  30 seconds with UE assistance indicating an increased LE strength and improved balance.    Baseline 58.47 s with UE assist and with A from PT for R LE positioning; 02/08/2022=54 sec with use of L UE support and Min physical assist to position right LE.  7/6: 30.5 sec, min A for LE positioning, some attempts did not come to full erect posture, did not let go with L UE from armchair with any attempt 05/05/22: 26.65 sec   Time 12    Period Weeks    Status Met    Target Date 05/03/22      PT LONG TERM GOAL #4   Title Patient will increase Berg Balance score by > 6 points to demonstrate decreased fall risk during functional activities.    Baseline 12 on 2/14; 02/08/2022= 13/56  04/07/22: 17  8/3: 19    Time 12    Period Weeks    Status Met    Target Date 05/03/22      PT LONG TERM GOAL #5   Title Pt will transfer from seated position on table/chair to/from transport chair/WC in order to indicate improved transfer ability.    Baseline Requires Min A for LE positioning and to prevent LOB. 02/08/2022=Patient attempted to perform on her own- Unable to push herself up from chair- Very min assist to position right LE into more flexed  position and VC for hand placement. Patient able to stand with min Assist with use of gait belt and VC to lean; 02/17/2022=Patient was able to transfer from transport w/c with some physical assist with Right to swing to pivot to side of chair. She was able to sit to stand today with only CGA and then stand pivot with right arm holding onto arm of chair. Only difficulty was stand to sit- Max VC not to plop into chair. 04/07/22: Pt performed movement of B LE to side of chair independently with chair transfer. Pt required min A with R LE foot placement of floor. Pt requires increased time to complete but complete with CGA only for safety and no cues or A to prevent falling/ plopping into armchair.  8/3: Pt requires min A for maintenance of standig balance, able to navigate from transport chairr to arm chair with hemiwalker, does not controll descent with ahands still " flops" into chair; 08/04/2022- Patient continues to be inconsistent in her transfers- at times only CGA and other more min Assist and repetitive VC. Unclear due to some ongoing cognitive issues if patient will achieve this goal but still presents with potential. 09/08/2022- Patient able to stand with CGA yet continues to require initial VC for proper sequencing.   Time 12    Period Weeks    Status Partially met    Target Date 10/27/2022       PT LONG TERM GOAL #6   Title Patient will ambulate > 150 feet with CGA using Hemiwalker on level surfaces for improved household and short community distances.    Baseline 02/08/2022= 40 feet with use of HW, heavy CGA with max VC for gait sequencing and w/c follow. 5/18 = 85 feet with use of HW, close CGA  7/6: 45 feet, heavy use of hemiwalker 8/3: 85 feet with heavy hemi walker use, no Lob throughout, cues for appropriate weight distribution; 06/28/2022 - 205 feet with use of hemiwalker, CGA; 08/04/2022- Patient presents with inconsistent gait ability - able to walk 150 feet with max effort and increased time  using hemiwalker and CGA.  Time 12    Period Weeks    Status On-going    Target Date 10/27/2022        PT LONG TERM GOAL #7   Title Pt will increase 10MWT by at least 0.13 m/s in order to demonstrate clinically significant improvement in community ambulation.    Baseline 02/08/2022= 0.05 m/s using HW 7/6: .035ms with heavy ue use of hemiwalker and CGA for balance 8/3: .0534m 06/28/2022= 0.1 m/s (will keep goal active to ensure she is consistent); 08/04/2022- 0.09 m/s)   Time 12    Period Weeks    Status Ongoing     Target Date 10/27/2022      8.  Pt will improve 5X STS with full knee extension in standing each rep and with appropriate eccentric control in order to indicate improved transfer efficacy and improved LE strength  Baseline: 52.45 sec with UE support; 06/28/2022= 36.03 sec with UE support (VC for erect standing- patient continues to press up using back of legs against chair to stand); 08/04/2022- Patient required 45 sec using left UE and max VC (flopping at times with sitting)  Goal status: ONGOING TARGET date: 10/27/2022  9.  Patient will increase Berg Balance score by > 6 points to demonstrate decreased fall risk during functional activities Baseline: 19; 06/28/2022= 19/56; 08/04/2022- Deferred secondary to patient too fatigued to attempt after walking today. Will reassess next visit. Goal status: ONGOING Target Date: 10/27/2022           Plan -     Clinical Impression Statement Treatment limited to LE strengthening secondary to patient declined to walk stating she was too sore but agreeable to exercises. Patient experienced increased difficulty overall with stand pivot transfers today. Pt continues to demonstrate progress toward goals AEB progression of interventions this date either in volume or intensity.    Personal Factors and Comorbidities Age;Comorbidity 1;Comorbidity 2;Comorbidity 3+;Time since onset of injury/illness/exacerbation;Other    Comorbidities AFib, Renal  Artery thrombosis, situational depression, HTN, Pyelnephritis, Seizure activity, COnstipation, AKI    Examination-Activity Limitations Bathing;Bed Mobility;Carry;Locomotion Level;Dressing;Sit;Squat;Stairs;Stand;Toileting;Transfers    Examination-Participation Restrictions Church;Community Activity;Laundry    Stability/Clinical Decision Making Evolving/Moderate complexity    Rehab Potential Fair    PT Frequency 2x / week    PT Duration 12 weeks    PT Treatment/Interventions ADLs/Self Care Home Management;Gait training;Stair training;Therapeutic activities;Therapeutic exercise;Balance training;Neuromuscular re-education;Patient/family education;Wheelchair mobility training;Manual techniques;Energy conservation;Dry needling;Passive range of motion;Taping;Spinal Manipulations;Joint Manipulations    PT Next Visit Plan Gait training, Transfer training, LE strengthening as appropriate.    PT Home Exercise Plan HEP: general LE and postural exercises    Consulted and Agree with Plan of Care Patient            JeLewis MoccasinT   09/13/22, 11:56 AM

## 2022-09-13 NOTE — Therapy (Signed)
OCCUPATIONAL THERAPY TREATMENT NOTE   Patient Name: Lindsey [name] MRN: [id number] DOB:[birth year], 66 y.o., female Today's Date: 09/13/2022   REFERRING PROVIDER: Tomasa Hose, MD    OT End of Session - 09/13/22 1012     Visit Number 54    Number of Visits 74    Date for OT Re-Evaluation 09/20/22    Authorization Type Progress report period starting 02/17/22    OT Start Time 1018    OT Stop Time 1100    OT Time Calculation (min) 42 min    Activity Tolerance Patient tolerated treatment well    Behavior During Therapy WFL for tasks assessed/performed                         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 No pertinent surgical history. 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 ([place]) 09/24/2021   Cerebral embolism with cerebral infarction 09/23/2021   Pyelonephritis 09/19/2021   Pain due to onychomycosis of toenails of both feet 11/19/2020   Normocytic anemia 05/31/2020   Acute ischemic left MCA stroke ([place]) 04/30/2020   Right hemiplegia ([place]) 04/30/2020   Cerebrovascular accident ([place]) 04/21/2020   Atrial fibrillation with RVR ([place]) 04/21/2020   Essential hypertension 04/21/2020   Alcohol abuse 04/21/2020    RUE Measurements (Taken 04/04/22):   PROM Shoulder flexion:   7/03: 0(108)  5/18: 0(105)  PROM Shoulder abduction: 7/03: [id number])  5/18: 0(102)  PROM Elbow:   06/28/2022: 0(0-140) 7/03: 0(0-140)  5/18: 0(0-136)  AROM Wrist extension:  06/28/2022: -18/(72) 7/03: -28(72)  5/18: -40(72)  REFERRING DIAG: CVA  THERAPY DIAG:  Muscle weakness (generalized)  Rationale for Evaluation and Treatment Rehabilitation  PERTINENT HISTORY: Pt. is a 66 y.o. 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 Pt. has Right sided weakness, and receptive, and expressive aphasia. Pt. has had a previous CVA with right sided weakness in July 2021. PMHx includes: AFib, Renal Artery thrombosis, situational depression, HTN, Pyelnephritis, Seizure activity, COnstipation, AKI. Vitamin D deficiency, and urinary incontinence/  PRECAUTIONS: Fall   SUBJECTIVE:  Pt. Reports feeling okay today.   PAIN:  Are you having pain? No   OBJECTIVE:   OT TREATMENT  Manual Therapy:   Pt. tolerated soft tissue massage to the right scapular musculature. Pt. tolerated scapular mobilizations in elevation, depression, abduction, and rotation following moist heat modality to the right shoulder. Manual therapy was performed independent of, and in preparation for there. Ex. Pt. education was provided about soft tissue massage to the radial volar, and dorsal aspect of the right forearm.    Therapeutic Exercise:   Pt. tolerated PROM through all joint ranges of the RUE, and  hand following moist heat modality, and manual therapy. Pt. worked on reps of active right wrist extension, and right supination with support proximally. Pt. education was provided about opportunities to engage the RUE during daily tasks, and IADL tasks.      Pt. continues to present with no consistent activation of volitional movement in the right hand secondary to increased tone, and tightness in the RUE. Pt. reports limited UE ROM at home. Pt. requires maxA  donning a brace. Pt. continues to work on normalizing tone, and facilitating active volitional movement in the RUE, and hand to improve engagement of the RUE during ADLs, as well as improve transfers, and functional mobility for toileting.    PATIENT EDUCATION: Education details: dressing techniques, HEP Person educated: Patient Education method: Medical illustrator Education comprehension: verbalized understanding, returned demonstration, verbal cues required, and tactile cues required   HOME EXERCISE PROGRAM   Continue with ongoing HEPs for ROM to the RUE, hand, and digits.   OT SHORT TERM GOAL #1     Title Pt and caregiver will be independent with HEP for RUE     Baseline Eval: No current program. 10th visit: Caregivers  report being comfortable with exercises at home. 01/06/2022: Continue ongoing HEPs for UE. 30th visit: Pt./caregivers to continue with ROM; 40th: pt/caregivers to continue with PROM throughout RUE 05/16/2022:  Continue ongoing pt./caregiver education about HEPs for the RUE.06/28/2022: Pt. Reports consistency has been limited. 07/14/2022: Consistency with UE ROM at home has been limited.    Time 6    Period Weeks     Status On-going     Target Date 08/09/2022                    OT Long Term Goals -                OT LONG TERM GOAL #1    Title Pt. will engage the RUE as a gross assist/atabilizer during ADLs 100% of the time     Baseline 07/14/2022: Pt. Continues to try to engage her RUE as a gross stabilizer. 06/28/2022: Pt. continues to try to engage her RUE as a gross stabilizer. Continues to stabilize the loofa 05/16/2022: Pt. Is able to place items, in her right hand, and use her right hand  to stabilize various sizes of bottles while using her left hand to open them. 04/04/2022: Pt. Is using her right hand to hold the loofa, and stabilize items. 30th visit: Pt. is initiating using her RUE as a gross stabilizer with maxA 01/06/2022: Pt. is starting to engage  her RUE as a gross stabiliizer with increased cues. Eval: pt. does not currently engage her RUE during ADLs. 10th visit: Pt. continues to need to work towards using her RUE as a gross stabilizer.     Time 12     Status On-going     Target Date 09/20/2022         OT LONG TERM GOAL #2    Title Pt. will perform UE dressing using one armed dressing techniques     Baseline Eval: MaxA, 10th visit: ModA 01/06/2022: min-modA 30th visit; min0-modA, 04/04/2022: minA donning loose shirts, MaxA tighter items/sports bra, independent doffing a shirt and bra 05/16/2022: minA donning loose shirts, MaxA tighter items/sports bra, independent doffing a shirt and bra. 06/28/2022: minA donning, and doffing loose T-shirts. 07/14/2022: minA donning loose shirts, and maxA tighter fitting clothing.     Time 12     Period Weeks  Status On-going     Target Date 09/20/2022         OT LONG TERM GOAL #3    Title Pt. will require minA to stand and hike pants     Baseline Eval: MaxA, 10th visit: [place] 01/06/2022: [place] 30th visit: ModA 04/04/2022: independent with elastic pants if above the knees. MaxA if they fall below the knee; 04/07/22: min guard-min A to reach below knees to simulate hiking pants 05/16/2022: Min guard-MinA reaching below the knee to hike pants.06/28/2022: Min-CGA reaching to hike pants below the knees. 07/14/2022:    Time 12     Period Weeks     Status On-going     Target Date 09/30/2022         OT LONG TERM GOAL #4    Title Pt. will perform LE dressing with minA     Baseline Eval: MaxA 10th visit: Pt. requires MaxA for donning her brace, shoes, and socks. ModA with pants. 01/06/2022: ModA pants, MaxA shoes, socks, and brace 30th visit: ModA pants, MaxA shoes, socks, and brace. 04/04/2022: MaxA; 04/07/22: pt can don/doff L sock and shoe with supv, dep for RLE; see above for hiking pants 05/16/2022:  Donning and doffing the Left sock and shoe with supervision. Total Assist for the RLE.06/28/2022: Supervision donning  left shoe, and sock, Dep. For RLE. 07/14/2022: Supervision donning left shoe and  sock, and Dependent for RLE.    Time 12     Period Weeks     Status On-going     Target Date 09/20/22          OT LONG TERM GOAL #5    Title Pt. will improve FOTO score by 2 pt.s to reflect funcational change     Baseline Eval: FOTO score: 12, 10th visit: FOTO score 27 01/06/2022: 27 30th visit TBA at next visit secondary to no family available during the session; 04/04/22: FOTO 40 05/16/2022: 27 07/14/2022: 25    Time 12     Period Weeks     Status On-going     Target Date 09/20/22          OT LONG TERM GOAL #6    Title Patient will transfer to bedside commode or toilet with supervision.     Baseline 30th visit: min-modA. 01/06/2022: min-ModA 04/04/2022: Min-ModA 05/16/2022: min-modA 06/28/2022: min-ModA 07/14/2022: min-modA    Time 4     Period Weeks     Status On-going     Target Date 09/20/22         OT LONG TERM GOAL #7                                                      Title: Pt. Will improve AROM wrist extension by 5 degrees in preparation for improving functional  reaching.                                                                                  Baseline:       7/03: -  28(72) 06/28/2022: -18(72) 07/14/2022: -18(58)            Time: 4           Period: Weeks           Status: Ongoing           Target Date: 09/20/2022   Plan - 03/24/22 1429     Clinical Impression Statement Pt. continues to present with no consistent activation of volitional movement in the right hand secondary to increased tone, and tightness in the RUE. Pt. reports limited UE ROM at home. Pt. requires maxA donning a brace. Pt. continues to work on normalizing tone, and facilitating active volitional movement in the RUE, and hand to improve engagement of the RUE during ADLs, as well as improve transfers, and functional mobility for toileting.                OT Occupational Profile and History Detailed Assessment- Review of  Records and additional review of physical, cognitive, psychosocial history related to current functional performance    Occupational performance deficits (Please refer to evaluation for details): Leisure;IADL's;ADL's    Body Structure / Function / Physical Skills ADL;IADL;FMC;ROM;UE functional use;Strength;Decreased knowledge of use of DME;Coordination    Rehab Potential Fair    Clinical Decision Making Several treatment options, min-mod task modification necessary    Comorbidities Affecting Occupational Performance: May have comorbidities impacting occupational performance    Modification or Assistance to Complete Evaluation  Min-Moderate modification of tasks or assist with assess necessary to complete eval    OT Frequency 2x / week    OT Duration 12 weeks    OT Treatment/Interventions Self-care/ADL training;Neuromuscular education;Electrical Stimulation;Patient/family education;Therapeutic activities;Passive range of motion;Therapist, nutritional;Therapeutic exercise;DME and/or AE instruction;Moist Heat    Consulted and Agree with Plan of Care Patient             Harrel Carina, MS, OTR/L  Harrel Carina, OT 09/13/2022, 10:51 AM

## 2022-09-15 ENCOUNTER — Ambulatory Visit: Payer: Medicare Other | Admitting: Occupational Therapy

## 2022-09-15 ENCOUNTER — Ambulatory Visit: Payer: Medicare Other

## 2022-09-15 ENCOUNTER — Encounter: Payer: Medicare Other | Admitting: Speech Pathology

## 2022-09-15 DIAGNOSIS — R269 Unspecified abnormalities of gait and mobility: Secondary | ICD-10-CM

## 2022-09-15 DIAGNOSIS — M6281 Muscle weakness (generalized): Secondary | ICD-10-CM

## 2022-09-15 DIAGNOSIS — R262 Difficulty in walking, not elsewhere classified: Secondary | ICD-10-CM

## 2022-09-15 DIAGNOSIS — R278 Other lack of coordination: Secondary | ICD-10-CM

## 2022-09-15 DIAGNOSIS — R2681 Unsteadiness on feet: Secondary | ICD-10-CM

## 2022-09-15 DIAGNOSIS — I63542 Cerebral infarction due to unspecified occlusion or stenosis of left cerebellar artery: Secondary | ICD-10-CM

## 2022-09-15 DIAGNOSIS — I63512 Cerebral infarction due to unspecified occlusion or stenosis of left middle cerebral artery: Secondary | ICD-10-CM

## 2022-09-15 DIAGNOSIS — R2689 Other abnormalities of gait and mobility: Secondary | ICD-10-CM

## 2022-09-15 NOTE — Therapy (Signed)
OCCUPATIONAL THERAPY TREATMENT NOTE   Patient Name: Lindsey [name] MRN: [id number] DOB:1955-11-28, 66 y.o., female Today's Date: 09/15/2022   REFERRING PROVIDER: Tomasa Hose, MD    OT End of Session - 09/15/22 1102     Visit Number 68    Number of Visits 31    Date for OT Re-Evaluation 09/20/22    Authorization Type Progress report period starting 02/17/22    OT Start Time 1020    OT Stop Time 1100    OT Time Calculation (min) 40 min    Activity Tolerance Patient tolerated treatment well    Behavior During Therapy WFL for tasks assessed/performed                         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 ([place]) 09/24/2021   Cerebral embolism with cerebral infarction 09/23/2021   Pyelonephritis 09/19/2021   Pain due to onychomycosis of toenails of both feet 11/19/2020   Normocytic anemia 05/31/2020   Acute ischemic left MCA stroke ([place]) 04/30/2020   Right hemiplegia ([place]) 04/30/2020   Cerebrovascular accident ([place]) 04/21/2020   Atrial fibrillation with RVR ([place]) 04/21/2020   Essential hypertension 04/21/2020   Alcohol abuse 04/21/2020    RUE Measurements (Taken 04/04/22):   PROM Shoulder flexion:  7/03: 0(108)   5/18: 0(105)  PROM Shoulder abduction: 7/03: [id number])  5/18: 0(102)  PROM Elbow:   06/28/2022: 0(0-140) 7/03: 0(0-140)  5/18: 0(0-136)  AROM Wrist extension:  06/28/2022: -18/(72) 7/03: -28(72)  5/18: -40(72)  REFERRING DIAG: CVA  THERAPY DIAG:  Muscle weakness (generalized)  Other lack of coordination  Rationale for Evaluation and Treatment Rehabilitation  PERTINENT HISTORY: Pt. is a 66 y.o. 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 Pt. has Right sided weakness, and receptive, and expressive aphasia. Pt. has had a previous CVA with right sided weakness in July 2021. PMHx includes: AFib, Renal Artery thrombosis, situational depression, HTN, Pyelnephritis, Seizure activity, COnstipation, AKI. Vitamin D deficiency, and urinary incontinence/  PRECAUTIONS: Fall   SUBJECTIVE:  Pt. Reports feeling okay today.   PAIN:  Are you having pain? No   OBJECTIVE:   OT TREATMENT  Manual Therapy:   Pt. tolerated soft tissue massage to the right scapular musculature. Pt. tolerated scapular mobilizations in elevation, depression, abduction, and rotation following moist heat modality to the right shoulder. Manual therapy was performed independent of, and in preparation for there. Ex. Pt. education was provided about soft tissue massage to the radial volar, and dorsal aspect of the right forearm.    Therapeutic Exercise:   Pt. tolerated PROM through all joint  ranges of the RUE, and hand following moist heat modality, and manual therapy. Pt. worked on reps of active right wrist extension, and right supination with support proximally. Pt. education was provided about opportunities to engage the RUE during daily tasks, and IADL tasks.      Pt. continues to present with no consistent activation of volitional movement in the right hand secondary to increased tone, and tightness in the RUE. Pt. reports limited UE ROM at home.  Pt. requires maxA donning the shoulder harness brace. Pt. continues to work on normalizing tone, and facilitating active volitional movement in the RUE, and hand to improve engagement of the RUE during ADLs, as well as improve transfers, and functional mobility for toileting. Plan for discharge from OT services  on 09/20/22.    PATIENT EDUCATION: Education details: dressing techniques, HEP Person educated: Patient Education method: Customer service manager Education comprehension: verbalized understanding, returned demonstration, verbal cues required, and tactile cues required   [place] with ongoing HEPs for ROM to the RUE, hand, and digits.   OT SHORT TERM GOAL #1     Title Pt and caregiver will be independent with HEP for RUE     Baseline Eval: No current program. 10th visit: Caregivers  report being comfortable with exercises at home. 01/06/2022: Continue ongoing HEPs for UE. 30th visit: Pt./caregivers to continue with ROM; 40th: pt/caregivers to continue with PROM throughout RUE 05/16/2022:  Continue ongoing pt./caregiver education about HEPs for the RUE.06/28/2022: Pt. Reports consistency has been limited. 07/14/2022: Consistency with UE ROM at home has been limited.    Time 6    Period Weeks     Status On-going     Target Date 08/09/2022                    OT Long Term Goals -                OT LONG TERM GOAL #1    Title Pt. will engage the RUE as a gross assist/atabilizer during ADLs 100% of the time     Baseline 07/14/2022: Pt. Continues to try to engage her RUE as a gross stabilizer. 06/28/2022: Pt. continues to try to engage her RUE as a gross stabilizer. Continues to stabilize the loofa 05/16/2022: Pt. Is able to place items, in her right hand, and use her right hand  to stabilize various sizes of bottles while using her left hand to open them. 04/04/2022: Pt. Is using her right hand to hold the loofa, and stabilize items. 30th visit: Pt. is  initiating using her RUE as a gross stabilizer with maxA 01/06/2022: Pt. is starting to engage her RUE as a gross stabiliizer with increased cues. Eval: pt. does not currently engage her RUE during ADLs. 10th visit: Pt. continues to need to work towards using her RUE as a gross stabilizer.     Time 12     Status On-going     Target Date 09/20/2022         OT LONG TERM GOAL #2    Title Pt. will perform UE dressing using one armed dressing techniques     Baseline Eval: MaxA, 10th visit: [place] 01/06/2022: min-modA 30th visit; min0-modA, 04/04/2022: minA donning loose shirts, MaxA tighter items/sports bra, independent doffing a shirt and bra 05/16/2022: minA donning loose shirts, MaxA tighter items/sports bra, independent doffing a shirt and bra. 06/28/2022: minA donning, and doffing loose T-shirts. 07/14/2022: minA donning loose shirts, and  maxA tighter fitting clothing.     Time 12     Period Weeks     Status On-going     Target Date 09/20/2022         OT LONG TERM GOAL #3    Title Pt. will require minA to stand and hike pants     Baseline Eval: MaxA, 10th visit: [place] 01/06/2022: [place] 30th visit: ModA 04/04/2022: independent with elastic pants if above the knees. MaxA if they fall below the knee; 04/07/22: min guard-min A to reach below knees to simulate hiking pants 05/16/2022: Min guard-MinA reaching below the knee to hike pants.06/28/2022: Min-CGA reaching to hike pants below the knees. 07/14/2022:    Time 12     Period Weeks     Status On-going     Target Date 09/30/2022         OT LONG TERM GOAL #4    Title Pt. will perform LE dressing with minA     Baseline Eval: MaxA 10th visit: Pt. requires MaxA for donning her brace, shoes, and socks. ModA with pants. 01/06/2022: ModA pants, MaxA shoes, socks, and brace 30th visit: ModA pants, MaxA shoes, socks, and brace. 04/04/2022: MaxA; 04/07/22: pt can don/doff L sock and shoe with supv, dep for RLE; see above for hiking pants 05/16/2022:  Donning and doffing the  Left sock and shoe with supervision. Total Assist for the RLE.06/28/2022: Supervision donning left shoe, and sock, Dep. For RLE. 07/14/2022: Supervision donning left shoe and  sock, and Dependent for RLE.    Time 12     Period Weeks     Status On-going     Target Date 09/20/22          OT LONG TERM GOAL #5    Title Pt. will improve FOTO score by 2 pt.s to reflect funcational change     Baseline Eval: FOTO score: 12, 10th visit: FOTO score 27 01/06/2022: 27 30th visit TBA at next visit secondary to no family available during the session; 04/04/22: FOTO 40 05/16/2022: 27 07/14/2022: 25    Time 12     Period Weeks     Status On-going     Target Date 09/20/22          OT LONG TERM GOAL #6    Title Patient will transfer to bedside commode or toilet with supervision.     Baseline 30th visit: min-modA. 01/06/2022: min-ModA 04/04/2022: Min-ModA 05/16/2022: min-modA 06/28/2022: min-ModA 07/14/2022: min-modA    Time 4     Period Weeks     Status On-going     Target Date 09/20/22         OT LONG TERM GOAL #7                                                      Title: Pt. Will improve AROM wrist extension by 5 degrees in preparation for improving functional  reaching.  Baseline:       7/03: -28(72) 06/28/2022: -18(72) 07/14/2022: -18(58)            Time: 4           Period: Weeks           Status: Ongoing           Target Date: 09/20/2022   Plan - 03/24/22 1429     Clinical Impression Statement Pt. continues to present with no consistent activation of volitional movement in the right hand secondary to increased tone, and tightness in the RUE. Pt. reports limited UE ROM at home. Pt. requires maxA donning the shoulder harness brace. Pt. continues to work on normalizing tone, and facilitating active volitional movement in the RUE, and hand to improve engagement of the RUE during ADLs, as well as improve transfers, and functional  mobility for toileting.               OT Occupational Profile and History Detailed Assessment- Review of Records and additional review of physical, cognitive, psychosocial history related to current functional performance    Occupational performance deficits (Please refer to evaluation for details): Leisure;IADL's;ADL's    Body Structure / Function / Physical Skills ADL;IADL;FMC;ROM;UE functional use;Strength;Decreased knowledge of use of DME;Coordination    Rehab Potential Fair    Clinical Decision Making Several treatment options, min-mod task modification necessary    Comorbidities Affecting Occupational Performance: May have comorbidities impacting occupational performance    Modification or Assistance to Complete Evaluation  Min-Moderate modification of tasks or assist with assess necessary to complete eval    OT Frequency 2x / week    OT Duration 12 weeks    OT Treatment/Interventions Self-care/ADL training;Neuromuscular education;Electrical Stimulation;Patient/family education;Therapeutic activities;Passive range of motion;Building services engineer;Therapeutic exercise;DME and/or AE instruction;Moist Heat    Consulted and Agree with Plan of Care Patient             Olegario Messier, MS, OTR/L  Olegario Messier, OT 09/15/2022, 11:05 AM

## 2022-09-15 NOTE — Therapy (Addendum)
OUTPATIENT PHYSICAL THERAPY TREATMENT NOTE Patient Name: Lindsey [name] MRN: [id number] DOB:1956-03-05, 66 y.o., female Today's Date: 09/15/2022  PCP: Dr. Tomasa Hose MD REFERRING PROVIDER: Leeroy Cha MD   PT End of Session - 09/15/22 1119     Visit Number 34    Number of Visits 16    Date for PT Re-Evaluation 10/27/22    Authorization Type Medicare    Authorization Time Period Cert through 05/05/65; Recert 11/12/4763-01/06/5034; Recert 01/07/5680-27/51; Recert 70/0/1749-4/49/6759    Progress Note Due on Visit 62    PT Start Time 1103    PT Stop Time 1145    PT Time Calculation (min) 42 min    Equipment Utilized During Treatment Gait belt    Activity Tolerance Patient tolerated treatment well    Behavior During Therapy WFL for tasks assessed/performed                          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 No pertinent surgical history. 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 ([place]) 09/24/2021   Cerebral embolism with cerebral infarction 09/23/2021   Pyelonephritis 09/19/2021   Pain due to onychomycosis of toenails of both feet 11/19/2020   Normocytic anemia 05/31/2020   Acute ischemic left MCA stroke ([place]) 04/30/2020   Right hemiplegia ([place]) 04/30/2020    Cerebrovascular accident ([place]) 04/21/2020   Atrial fibrillation with RVR ([place]) 04/21/2020   Essential hypertension 04/21/2020   Alcohol abuse 04/21/2020    REFERRING DIAG: s/p CVA  THERAPY DIAG:  Muscle weakness (generalized)  Other lack of coordination  Unsteadiness on feet  Abnormality of gait and mobility  Difficulty in walking, not elsewhere classified  Acute ischemic left MCA stroke (HCC)  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 At this point she is limited to wheelchair as her main means of mobility. Pt caregiver reports all detailed information. Pt has hesitation with the left lower extremity and significant weakness on the right lower extremity. Pt will take 2-3 steps when transfering. Pt caregiver assists with all transfers.Pt has shower with ledge to steps over ledge when entering and utilizes bedside commode. Pt has 2 level home with chair lift for upstairs access. Pt would like to improve ransfers, improve neglect on her R side and improve her overall strength and mobility.Pt. is a 66 y.o. 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 Pt. has Right sided weakness, and receptive, and  expressive aphasia. Pt. has had a previous CVA with right sided weakness in July 2021. PMHx includes: AFib, Renal Artery thrombosis, situational depression, HTN, Pyelnephritis, Seizure activity, COnstipation, AKI. Vitamin D deficiency, and urinary incontinence.   PRECAUTIONS: fall risk, wearing RLE AFO  SUBJECTIVE:  Patient reports feeling better and denies any further right LE pain.    PAIN:  Are you having pain? No      TODAY'S TREATMENT:   09/15/22     INTERVENTIONS:     THEREX:   STS transfer from transport chair to standard arm chair with min- mod A from PT for safety and for LE movement and control x 10 reps  PROM to right knee into flex/ext 2*10 AROM on L 2 x 10  AROM hip march on left x 12 reps x 2 sets AROM hip march on right x 12 reps x 2 sets     NMR:   Static stand on right LE with dynamic step tap onto 2" block x 15 reps. (Min assist as patient presents with poor initial ability to shift weight and maintain on right LE)  Static stand at support bar with Left UE support x 2 min then 30 sec rest than another 2 min Static stand at support bar without left UE support x 1 min 25 sec; 43 sec; and 48 sec Static stand at support bar with green therapad under left foot to facilitate weight bearing on right LE with left UE support x 2 min x 2 trials.  Dynamic weight shift - Left to right with left foot on green therapad x 30 reps with Left UE Support Dynamic weight shift - Left to right with left foot on green therapad x 30 reps without Left UE Support     PATIENT EDUCATION: Education details: Pt educated throughout session about proper posture and technique with exercises. Improved exercise technique, movement at target joints, use of target muscles after min to mod verbal, visual, tactile cues. Continued education on technique with STS.  Person educated: Patient Education method: Explanation, Demonstration, and Verbal cues Education comprehension: verbalized understanding, returned demonstration, and needs further education   HOME EXERCISE PROGRAM: Continue as previously given;    PT Short Term Goals -       PT SHORT TERM GOAL #1   Title Patient will be independent in home exercise program to improve strength/mobility for better functional independence with ADLs.    Baseline no formal HEp for LE/balance at this time; 02/08/2022=Patient continues to require VC for reminders and assist with hip march/knee  ext (seated for strengthening); Reminders to continue to work on static stand at home.    Time 4    Period Weeks    Status Partially Met  04/07/22: complete somewhat regularly     Target Date 12/14/21              PT Long Term Goals -       PT LONG TERM GOAL #1   Title Patient will increase FOTO score to equal to or greater than   41  to demonstrate statistically significant improvement in mobility and quality of life.    Baseline 16 on 2/14; 02/08/2022=Patient unable to complete today- No family present but will attempt again next visit available with family present to assist in completion; 02/17/2022= 40; 06/28/2022- No caregiver present in PT session to assist in answering questions. 08/04/2022= 40    Time 12    Period Weeks    Status On-going  Target Date 10/27/2022       PT LONG TERM GOAL #3   Title Patient  will complete five times sit to stand test in < 30 seconds with UE assistance indicating an increased LE strength and improved balance.    Baseline 58.47 s with UE assist and with A from PT for R LE positioning; 02/08/2022=54 sec with use of L UE support and Min physical assist to position right LE.  7/6: 30.5 sec, min A for LE positioning, some attempts did not come to full erect posture, did not let go with L UE from armchair with any attempt 05/05/22: 26.65 sec   Time 12    Period Weeks    Status Met    Target Date 05/03/22      PT LONG TERM GOAL #4   Title Patient will increase Berg Balance score by > 6 points to demonstrate decreased fall risk during functional activities.    Baseline 12 on 2/14; 02/08/2022= 13/56  04/07/22: 17  8/3: 19    Time 12    Period Weeks    Status Met    Target Date 05/03/22      PT LONG TERM GOAL #5   Title Pt will transfer from seated position on table/chair to/from transport chair/WC in order to indicate improved transfer ability.    Baseline Requires Min A for LE positioning and to prevent LOB. 02/08/2022=Patient attempted to perform on her  own- Unable to push herself up from chair- Very min assist to position right LE into more flexed position and VC for hand placement. Patient able to stand with min Assist with use of gait belt and VC to lean; 02/17/2022=Patient was able to transfer from transport w/c with some physical assist with Right to swing to pivot to side of chair. She was able to sit to stand today with only CGA and then stand pivot with right arm holding onto arm of chair. Only difficulty was stand to sit- Max VC not to plop into chair. 04/07/22: Pt performed movement of B LE to side of chair independently with chair transfer. Pt required min A with R LE foot placement of floor. Pt requires increased time to complete but complete with CGA only for safety and no cues or A to prevent falling/ plopping into armchair.  8/3: Pt requires min A for maintenance of standig balance, able to navigate from transport chairr to arm chair with hemiwalker, does not controll descent with ahands still " flops" into chair; 08/04/2022- Patient continues to be inconsistent in her transfers- at times only CGA and other more min Assist and repetitive VC. Unclear due to some ongoing cognitive issues if patient will achieve this goal but still presents with potential. 09/08/2022- Patient able to stand with CGA yet continues to require initial VC for proper sequencing.   Time 12    Period Weeks    Status Partially met    Target Date 10/27/2022       PT LONG TERM GOAL #6   Title Patient will ambulate > 150 feet with CGA using Hemiwalker on level surfaces for improved household and short community distances.    Baseline 02/08/2022= 40 feet with use of HW, heavy CGA with max VC for gait sequencing and w/c follow. 5/18 = 85 feet with use of HW, close CGA  7/6: 45 feet, heavy use of hemiwalker 8/3: 85 feet with heavy hemi walker use, no Lob throughout, cues for appropriate weight distribution; 06/28/2022 - 205 feet with  use of hemiwalker, CGA; 08/04/2022- Patient  presents with inconsistent gait ability - able to walk 150 feet with max effort and increased time using hemiwalker and CGA.    Time 12    Period Weeks    Status On-going    Target Date 10/27/2022        PT LONG TERM GOAL #7   Title Pt will increase 10MWT by at least 0.13 m/s in order to demonstrate clinically significant improvement in community ambulation.    Baseline 02/08/2022= 0.05 m/s using HW 7/6: .060ms with heavy ue use of hemiwalker and CGA for balance 8/3: .0592m 06/28/2022= 0.1 m/s (will keep goal active to ensure she is consistent); 08/04/2022- 0.09 m/s)   Time 12    Period Weeks    Status Ongoing     Target Date 10/27/2022      8.  Pt will improve 5X STS with full knee extension in standing each rep and with appropriate eccentric control in order to indicate improved transfer efficacy and improved LE strength  Baseline: 52.45 sec with UE support; 06/28/2022= 36.03 sec with UE support (VC for erect standing- patient continues to press up using back of legs against chair to stand); 08/04/2022- Patient required 45 sec using left UE and max VC (flopping at times with sitting)  Goal status: ONGOING TARGET date: 10/27/2022  9.  Patient will increase Berg Balance score by > 6 points to demonstrate decreased fall risk during functional activities Baseline: 19; 06/28/2022= 19/56; 08/04/2022- Deferred secondary to patient too fatigued to attempt after walking today. Will reassess next visit. Goal status: ONGOING Target Date: 10/27/2022           Plan -     Clinical Impression Statement Treatment focused on weight shifting to right side today with good results. She initially presented with pushing toward affected side but was able to bear weight through right LE with some effort  today. Pt continues to demonstrate progress toward goals AEB progression of interventions this date either in volume or intensity.    Personal Factors and Comorbidities Age;Comorbidity 1;Comorbidity  2;Comorbidity 3+;Time since onset of injury/illness/exacerbation;Other    Comorbidities AFib, Renal Artery thrombosis, situational depression, HTN, Pyelnephritis, Seizure activity, COnstipation, AKI    Examination-Activity Limitations Bathing;Bed Mobility;Carry;Locomotion Level;Dressing;Sit;Squat;Stairs;Stand;Toileting;Transfers    Examination-Participation Restrictions Church;Community Activity;Laundry    Stability/Clinical Decision Making Evolving/Moderate complexity    Rehab Potential Fair    PT Frequency 2x / week    PT Duration 12 weeks    PT Treatment/Interventions ADLs/Self Care Home Management;Gait training;Stair training;Therapeutic activities;Therapeutic exercise;Balance training;Neuromuscular re-education;Patient/family education;Wheelchair mobility training;Manual techniques;Energy conservation;Dry needling;Passive range of motion;Taping;Spinal Manipulations;Joint Manipulations    PT Next Visit Plan Gait training, Transfer training, LE strengthening as appropriate.    PT Home Exercise Plan HEP: general LE and postural exercises    Consulted and Agree with Plan of Care Patient            JeLewis MoccasinT   09/15/22, 11:59 AM

## 2022-09-20 ENCOUNTER — Ambulatory Visit: Payer: Medicare Other

## 2022-09-20 ENCOUNTER — Ambulatory Visit: Payer: Medicare Other | Admitting: Occupational Therapy

## 2022-09-20 ENCOUNTER — Encounter: Payer: Medicare Other | Admitting: Speech Pathology

## 2022-09-20 DIAGNOSIS — R278 Other lack of coordination: Secondary | ICD-10-CM

## 2022-09-20 DIAGNOSIS — R2681 Unsteadiness on feet: Secondary | ICD-10-CM

## 2022-09-20 DIAGNOSIS — R262 Difficulty in walking, not elsewhere classified: Secondary | ICD-10-CM

## 2022-09-20 DIAGNOSIS — M6281 Muscle weakness (generalized): Secondary | ICD-10-CM | POA: Diagnosis not present

## 2022-09-20 DIAGNOSIS — I63542 Cerebral infarction due to unspecified occlusion or stenosis of left cerebellar artery: Secondary | ICD-10-CM

## 2022-09-20 DIAGNOSIS — I63512 Cerebral infarction due to unspecified occlusion or stenosis of left middle cerebral artery: Secondary | ICD-10-CM

## 2022-09-20 DIAGNOSIS — R2689 Other abnormalities of gait and mobility: Secondary | ICD-10-CM

## 2022-09-20 DIAGNOSIS — R269 Unspecified abnormalities of gait and mobility: Secondary | ICD-10-CM

## 2022-09-20 NOTE — Therapy (Signed)
OUTPATIENT PHYSICAL THERAPY TREATMENT NOTE Patient Name: Lindsey [name] MRN: [id number] DOB:[birth year], 66 y.o., female Today's Date: 09/20/2022  PCP: Dr. Tomasa Hose MD REFERRING PROVIDER: Leeroy Cha MD   Lindsey [name] End of Session - 09/20/22 1057     Visit Number 62    Number of Visits 62    Date for Lindsey [name] Re-Evaluation 10/27/22    Authorization Type Medicare    Authorization Time Period Cert through 03/04/21; Recert 06/10/9891-10/04/9415; Recert 4/0/8144-81/85; Recert 63/10/4968-2/63/7858    Progress Note Due on Visit 51    Lindsey [name] Start Time 1055    Lindsey [name] Stop Time 1139    Lindsey [name] Time Calculation (min) 44 min    Equipment Utilized During Treatment Gait belt    Activity Tolerance Patient tolerated treatment well    Behavior During Therapy WFL for tasks assessed/performed                           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 No pertinent surgical history. 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 ([place]) 09/24/2021   Cerebral embolism with cerebral infarction 09/23/2021   Pyelonephritis 09/19/2021   Pain due to onychomycosis of toenails of both feet 11/19/2020   Normocytic anemia 05/31/2020   Acute ischemic left MCA stroke ([place]) 04/30/2020   Right hemiplegia ([place]) 04/30/2020    Cerebrovascular accident ([place]) 04/21/2020   Atrial fibrillation with RVR ([place]) 04/21/2020   Essential hypertension 04/21/2020   Alcohol abuse 04/21/2020    REFERRING DIAG: s/p CVA  THERAPY DIAG:  Muscle weakness (generalized)  Other lack of coordination  Unsteadiness on feet  Abnormality of gait and mobility  Difficulty in walking, not elsewhere classified  Acute ischemic left MCA stroke (HCC)  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 At this point she is limited to wheelchair as her main means of mobility. Lindsey [name] caregiver reports all detailed information. Lindsey [name] has hesitation with the left lower extremity and significant weakness on the right lower extremity. Lindsey [name] will take 2-3 steps when transfering. Lindsey [name] caregiver assists with all transfers.Lindsey [name] has shower with ledge to steps over ledge when entering and utilizes bedside commode. Lindsey [name] has 2 level home with chair lift for upstairs access. Lindsey [name] would like to improve ransfers, improve neglect on her R side and improve her overall strength and mobility.Lindsey [name]. is a 66 y.o. 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 Lindsey [name]. has Right sided weakness, and receptive,  and expressive aphasia. Lindsey [name]. has had a previous CVA with right sided weakness in July 2021. PMHx includes: AFib, Renal Artery thrombosis, situational depression, HTN, Pyelnephritis, Seizure activity, COnstipation, AKI. Vitamin D deficiency, and urinary incontinence.   PRECAUTIONS: fall risk, wearing RLE AFO  SUBJECTIVE:    Patient reports doing okay- Denies any further pain or falls  PAIN:  Are you having pain? No      TODAY'S TREATMENT:   09/20/22     INTERVENTIONS:     THEREX:   STS transfer from transport chair to standard arm chair with min- min A from Lindsey [name] for safety and less VC for hand placement x 10 reps  PROM to right knee into flex/ext 2*10 AROM on L 2 x 10       NMR:   At steps:  Dynamic tapping onto 1st step with left LE (faciliating WB through Right LE) - CGA and patient holding onto railing with left arm x 10 reps Dynamic step tap onto 1/2 foam positioned directly in front of patient- with Right LE x 10 reps  Blaze pod activity:   Activity Description: Step tap with LE- The Blaze Pod Random setting was chosen to enhance cognitive processing and agility, providing an unpredictable environment to simulate real-world scenarios, and fostering quick reactions and adaptability.   Activity Setting:  random Number of Pods:  2 Cycles/Sets:  3 Duration (Time or Hit Count):  10 hits (left LE) - facilitate standing on right LE  Patient Stats  Reaction Time:  1) 1 min 29 sec 2) 1 min 20 sec 3) 58.72 sec with Left arm on rail     Gait training:  Patient ambulated approx 50 feet with hemiwalker, CGA with intermittent VC for gait sequencing- patient exhibiting difficulty with      PATIENT EDUCATION: Education details: Lindsey [name] educated throughout session about proper posture and technique with exercises. Improved exercise technique, movement at target joints, use of target muscles after min to mod verbal, visual, tactile cues. Continued education on technique with STS.  Person educated: Patient Education method: Explanation, Demonstration, and Verbal cues Education comprehension: verbalized understanding, returned demonstration, and needs further education   HOME EXERCISE PROGRAM: Continue as previously given;    Lindsey [name] Short Term Goals -       Lindsey [name] SHORT TERM GOAL #1   Title Patient will be independent in home exercise program to improve strength/mobility for better functional independence with ADLs.     Baseline no formal HEp for LE/balance at this time; 02/08/2022=Patient continues to require VC for reminders and assist with hip march/knee ext (seated for strengthening); Reminders to continue to work on static stand at home.    Time 4    Period Weeks    Status Partially Met  04/07/22: complete somewhat regularly     Target Date 12/14/21              Lindsey [name] Long Term Goals -       Lindsey [name] LONG TERM GOAL #1   Title Patient will increase FOTO score to equal to or greater than   41  to demonstrate statistically significant improvement in mobility and quality of life.    Baseline 16 on 2/14; 02/08/2022=Patient unable to complete today- No family present but will attempt again next visit available with family present to assist in completion; 02/17/2022= 40; 06/28/2022- No caregiver present in Lindsey [name] session to assist in answering questions. 08/04/2022= 40    Time 12    Period Weeks  Status On-going    Target Date 10/27/2022       Lindsey [name] LONG TERM GOAL #3   Title Patient  will complete five times sit to stand test in < 30 seconds with UE assistance indicating an increased LE strength and improved balance.    Baseline 58.47 s with UE assist and with A from Lindsey [name] for R LE positioning; 02/08/2022=54 sec with use of L UE support and Min physical assist to position right LE.  7/6: 30.5 sec, min A for LE positioning, some attempts did not come to full erect posture, did not let go with L UE from armchair with any attempt 05/05/22: 26.65 sec   Time 12    Period Weeks    Status Met    Target Date 05/03/22      Lindsey [name] LONG TERM GOAL #4   Title Patient will increase Berg Balance score by > 6 points to demonstrate decreased fall risk during functional activities.    Baseline 12 on 2/14; 02/08/2022= 13/56  04/07/22: 17  8/3: 19    Time 12    Period Weeks    Status Met    Target Date 05/03/22      Lindsey [name] LONG TERM GOAL #5   Title Lindsey [name] will transfer from seated position on table/chair to/from transport chair/WC in order to indicate  improved transfer ability.    Baseline Requires Min A for LE positioning and to prevent LOB. 02/08/2022=Patient attempted to perform on her own- Unable to push herself up from chair- Very min assist to position right LE into more flexed position and VC for hand placement. Patient able to stand with min Assist with use of gait belt and VC to lean; 02/17/2022=Patient was able to transfer from transport w/c with some physical assist with Right to swing to pivot to side of chair. She was able to sit to stand today with only CGA and then stand pivot with right arm holding onto arm of chair. Only difficulty was stand to sit- Max VC not to plop into chair. 04/07/22: Lindsey [name] performed movement of B LE to side of chair independently with chair transfer. Lindsey [name] required min A with R LE foot placement of floor. Lindsey [name] requires increased time to complete but complete with CGA only for safety and no cues or A to prevent falling/ plopping into armchair.  8/3: Lindsey [name] requires min A for maintenance of standig balance, able to navigate from transport chairr to arm chair with hemiwalker, does not controll descent with ahands still " flops" into chair; 08/04/2022- Patient continues to be inconsistent in her transfers- at times only CGA and other more min Assist and repetitive VC. Unclear due to some ongoing cognitive issues if patient will achieve this goal but still presents with potential. 09/08/2022- Patient able to stand with CGA yet continues to require initial VC for proper sequencing.   Time 12    Period Weeks    Status Partially met    Target Date 10/27/2022       Lindsey [name] LONG TERM GOAL #6   Title Patient will ambulate > 150 feet with CGA using Hemiwalker on level surfaces for improved household and short community distances.    Baseline 02/08/2022= 40 feet with use of HW, heavy CGA with max VC for gait sequencing and w/c follow. 5/18 = 85 feet with use of HW, close CGA  7/6: 45 feet, heavy use of hemiwalker 8/3: 85 feet with heavy hemi walker  use, no Lob throughout, cues for appropriate weight  distribution; 06/28/2022 - 205 feet with use of hemiwalker, CGA; 08/04/2022- Patient presents with inconsistent gait ability - able to walk 150 feet with max effort and increased time using hemiwalker and CGA.    Time 12    Period Weeks    Status On-going    Target Date 10/27/2022        Lindsey [name] LONG TERM GOAL #7   Title Lindsey [name] will increase 10MWT by at least 0.13 m/s in order to demonstrate clinically significant improvement in community ambulation.    Baseline 02/08/2022= 0.05 m/s using HW 7/6: .087ms with heavy ue use of hemiwalker and CGA for balance 8/3: .0540m 06/28/2022= 0.1 m/s (will keep goal active to ensure she is consistent); 08/04/2022- 0.09 m/s)   Time 12    Period Weeks    Status Ongoing     Target Date 10/27/2022      8.  Lindsey [name] will improve 5X STS with full knee extension in standing each rep and with appropriate eccentric control in order to indicate improved transfer efficacy and improved LE strength  Baseline: 52.45 sec with UE support; 06/28/2022= 36.03 sec with UE support (VC for erect standing- patient continues to press up using back of legs against chair to stand); 08/04/2022- Patient required 45 sec using left UE and max VC (flopping at times with sitting)  Goal status: ONGOING TARGET date: 10/27/2022  9.  Patient will increase Berg Balance score by > 6 points to demonstrate decreased fall risk during functional activities Baseline: 19; 06/28/2022= 19/56; 08/04/2022- Deferred secondary to patient too fatigued to attempt after walking today. Will reassess next visit. Goal status: ONGOING Target Date: 10/27/2022           Plan -     Clinical Impression Statement Patient arrived without report of any further right LE pain and motivated to stand and workout. The patient demonstrated some progress while utilizing [employer]asing improved coordination, balance, and cognitive function. The incorporation of dual-tasking  technology with color recognition and association with specific movements in Blaze Pods was strategically chosen to provide a dynamic training environment, enabling the patient to engage in simultaneous physical and cognitive tasks. This unique approach enhances not only their physical abilities but also fosters increased neural connectivity and mental awareness, contributing to a well-rounded and effective rehabilitation and training experience.  Patient ambulated shorter distance than some previous sessions yet less overall VC and no physical assist with gait sequencing today.  Lindsey [name] will continue to benefit from skilled physical therapy intervention to address impairments, improve QOL, and attain therapy goals.    Personal Factors and Comorbidities Age;Comorbidity 1;Comorbidity 2;Comorbidity 3+;Time since onset of injury/illness/exacerbation;Other    Comorbidities AFib, Renal Artery thrombosis, situational depression, HTN, Pyelnephritis, Seizure activity, COnstipation, AKI    Examination-Activity Limitations Bathing;Bed Mobility;Carry;Locomotion Level;Dressing;Sit;Squat;Stairs;Stand;Toileting;Transfers    Examination-Participation Restrictions Church;Community Activity;Laundry    Stability/Clinical Decision Making Evolving/Moderate complexity    Rehab Potential Fair    Lindsey [name] Frequency 2x / week    Lindsey [name] Duration 12 weeks    Lindsey [name] Treatment/Interventions ADLs/Self Care Home Management;Gait training;Stair training;Therapeutic activities;Therapeutic exercise;Balance training;Neuromuscular re-education;Patient/family education;Wheelchair mobility training;Manual techniques;Energy conservation;Dry needling;Passive range of motion;Taping;Spinal Manipulations;Joint Manipulations    Lindsey [name] Next Visit Plan Gait training, Transfer training, LE strengthening as appropriate.    Lindsey [name] Home Exercise Plan HEP: general LE and postural exercises    Consulted and Agree with Plan of Care Patient            JeLewis MoccasinPT   09/20/22, 5:01  PM

## 2022-09-20 NOTE — Therapy (Signed)
Occupational Therapy Progress Note/Discharge Note  Dates of reporting period  07/14/2022   to   09/20/2022    Patient Name: Lindsey [name] MRN: [id number] DOB:1956/09/08, 66 y.o., female Today's Date: 09/20/2022   REFERRING PROVIDER: Tomasa Hose, MD    OT End of Session - 09/20/22 1034     Visit Number 57    Number of Visits 16    Date for OT Re-Evaluation 09/20/22    OT Start Time 1012    OT Stop Time 1045    OT Time Calculation (min) 33 min    Activity Tolerance Patient tolerated treatment well    Behavior During Therapy WFL for tasks assessed/performed                         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 ([place]) 09/24/2021   Cerebral embolism with cerebral infarction 09/23/2021   Pyelonephritis 09/19/2021   Pain due to onychomycosis of toenails of both feet 11/19/2020   Normocytic anemia 05/31/2020   Acute ischemic left MCA stroke ([place]) 04/30/2020   Right hemiplegia ([place]) 04/30/2020   Cerebrovascular accident ([place]) 04/21/2020   Atrial fibrillation with RVR ([place]) 04/21/2020   Essential hypertension 04/21/2020   Alcohol abuse 04/21/2020    RUE Measurements (Taken 04/04/22):   PROM Shoulder flexion:   7/03: 0(108)  5/18: 0(105)  PROM Shoulder abduction: 7/03: 8(502)  5/18: 0(102)  PROM Elbow:   06/28/2022: 0(0-140) 7/03: 0(0-140)  5/18: 0(0-136)  AROM Wrist extension:  06/28/2022: -18/(72) 7/03: -28(72)  5/18: -40(72)  REFERRING DIAG: CVA  THERAPY DIAG:  Muscle weakness (generalized)  Other lack of coordination  Rationale for Evaluation and Treatment Rehabilitation  PERTINENT HISTORY: Pt. is a 66 y.o. 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 Pt. has Right sided weakness, and receptive, and expressive aphasia. Pt. has had a previous CVA with right sided weakness in July 2021. PMHx includes: AFib, Renal Artery thrombosis, situational depression, HTN, Pyelnephritis, Seizure activity, COnstipation, AKI. Vitamin D deficiency, and urinary incontinence/  PRECAUTIONS: Fall   SUBJECTIVE:  Pt. Reports feeling okay today.   PAIN:  Are you having pain? No   OBJECTIVE:   OT TREATMENT  Manual Therapy:   Pt. tolerated soft tissue massage to the right scapular musculature. Pt. tolerated scapular mobilizations in elevation, depression, abduction, and rotation following moist heat modality to the right shoulder. Manual therapy was performed independent of, and in preparation for there. Ex. Pt. education was provided about soft tissue massage to the radial volar, and dorsal aspect of the right forearm.    Therapeutic Exercise:   Pt.  tolerated PROM through all joint ranges of the RUE, and hand following moist heat modality, and manual therapy. Pt. worked on reps of active right wrist extension, and right supination with support proximally. Pt. education was provided about opportunities to engage the RUE during daily tasks, and IADL tasks.      Measurements were obtained, and goals were reviewed with the PT. Pt. has made progress since the initial OT evaluation, however has reached a plateau at this time. Pt.  Education was provided about RUE ROM, soft tissue massage to the radial aspect of the forearm. Caregivers are aware of Pt.'s ROM, ADL, splinting , and bracing needs and recommendations.OT services are discharged at this time.    PATIENT EDUCATION: Education details: dressing techniques, HEP Person educated: Patient Education method: Customer service manager Education comprehension: verbalized understanding, returned demonstration, verbal cues required, and tactile cues required   [place] with ongoing HEPs for ROM to the RUE, hand, and digits.   OT SHORT TERM GOAL #1     Title Pt and caregiver will be independent with HEP for RUE     Baseline Eval: No current program. 10th visit: Caregivers  report being comfortable with exercises at home. 01/06/2022: Continue ongoing HEPs for UE. 30th visit: Pt./caregivers to continue with ROM; 40th: pt/caregivers to continue with PROM throughout RUE 05/16/2022:  Continue ongoing pt./caregiver education about HEPs for the RUE.06/28/2022: Pt. Reports consistency has been limited. 07/14/2022: Consistency with UE ROM at home has been limited. 09/20/2022: Pt requires assist from her daughter for ROM. Pt. Is able to engage  during the task, however requires cues, and hand over hand assist.    Time 6    Period Weeks     Status Not met    Target Date                     OT Long Term Goals -                OT LONG TERM GOAL #1    Title Pt. will engage the RUE as a gross assist/stabilizer during ADLs 100% of the time     Baseline 09/20/2022: Pt. Is engaging her right hand as a gross stabilizer, however requires assist 07/14/2022: Pt. Continues to try to engage her RUE as a gross stabilizer. 06/28/2022: Pt. continues to try to engage her RUE as a gross stabilizer. Continues to stabilize the loofa 05/16/2022: Pt. Is able to place items, in her right hand, and use her right hand  to stabilize various sizes of bottles while using her  left hand to open them. 04/04/2022: Pt. Is using her right hand to hold the loofa, and stabilize items. 30th visit: Pt. is initiating using her RUE as a gross stabilizer with maxA 01/06/2022: Pt. is starting to engage her RUE as a gross stabiliizer with increased cues. Eval: pt. does not currently engage her RUE during ADLs. 10th visit: Pt. continues to need to work towards using her RUE as a gross stabilizer.     Time 12     Status  Partially met     Target Date          OT LONG TERM GOAL #2    Title Pt. will perform UE dressing using one armed dressing techniques     Baseline Eval: MaxA, 10th visit: [place] 01/06/2022: min-modA 30th visit; min0-modA, 04/04/2022: minA donning loose shirts, MaxA tighter items/sports bra, independent doffing a shirt and bra  05/16/2022: minA donning loose shirts, MaxA tighter items/sports bra, independent doffing a shirt and bra. 06/28/2022: minA donning, and doffing loose T-shirts. 07/14/2022: minA donning loose shirts, and maxA tighter fitting clothing. 09/20/2022:minA donning loose shirts, and maxA tighter fitting clothing     Time 12     Period Weeks     Status Partially met    Target Date          OT LONG TERM GOAL #3    Title Pt. will require minA to stand and hike pants     Baseline Eval: MaxA, 10th visit: [place] 01/06/2022: [place] 30th visit: [place] 04/04/2022: independent with elastic pants if above the knees. MaxA if they fall below the knee; 04/07/22: min guard-min A to reach below knees to simulate hiking pants 05/16/2022: Min guard-MinA reaching below the knee to hike pants.06/28/2022: Min-CGA reaching to hike pants below the knees. 07/14/2022:Min-CGA reaching to hike pants below the knees. 09/20/2022: Min-CGA reaching to hike pants below the knees.     Time 12     Period Weeks     Status Met    Target Date          OT LONG TERM GOAL #4    Title Pt. will perform LE dressing with minA     Baseline Eval: MaxA 10th visit: Pt. requires MaxA for donning her brace, shoes, and  socks. ModA with pants. 01/06/2022: ModA pants, MaxA shoes, socks, and brace 30th visit: ModA pants, MaxA shoes, socks, and brace. 04/04/2022: MaxA; 04/07/22: pt can don/doff L sock and shoe with supv, dep for RLE; see above for hiking pants 05/16/2022:  Donning and doffing the Left sock and shoe with supervision. Total Assist for the RLE.06/28/2022: Supervision donning left shoe, and sock, Dep. For RLE. 07/14/2022: Supervision donning left shoe and  sock, and Dependent for RLE. 09/20/2022: Dependent for the RLE    Time 12     Period Weeks     Status Not met    Target Date          OT LONG TERM GOAL #5    Title Pt. will improve FOTO score by 2 pt.s to reflect funcational change     Baseline Eval: FOTO score: 12, 10th visit: FOTO score 27 01/06/2022: 27 30th visit TBA at next visit secondary to no family available during the session; 04/04/22: FOTO 40 05/16/2022: 27 07/14/2022: 25    Time 12     Period Weeks     Status On-going     Target Date 09/20/22          OT LONG TERM GOAL #6    Title Patient will transfer to bedside commode or toilet with supervision.     Baseline 30th visit: min-modA. 01/06/2022: min-ModA 04/04/2022: Min-ModA 05/16/2022: min-modA 06/28/2022: min-ModA 07/14/2022: min-modA 09/20/2022: min-modA    Time 4     Period Weeks     Status Not met    Target Date         OT LONG TERM GOAL #7                                                      Title: Pt. Will improve AROM wrist extension by 5 degrees in preparation for improving functional  reaching.  Baseline:       7/03: -28(72) 06/28/2022: -18(72) 07/14/2022: -18(58) 09/20/2022: -62(-62)            Time: 4           Period: Weeks           Status: Not met           Target Date:    Plan - 03/24/22 1429     Clinical Impression Statement  Measurements were obtained, and goals were reviewed with the PT. Pt. has made progress since the initial OT evaluation,  however has reached a plateau at this time. Pt. Education was provided about RUE ROM, soft tissue massage to the radial aspect of the forearm. Caregivers are aware of Pt.'s ROM, ADL, splinting , and bracing needs and recommendations.OT services are discharged at this time.               OT Occupational Profile and History Detailed Assessment- Review of Records and additional review of physical, cognitive, psychosocial history related to current functional performance    Occupational performance deficits (Please refer to evaluation for details): Leisure;IADL's;ADL's    Body Structure / Function / Physical Skills ADL;IADL;FMC;ROM;UE functional use;Strength;Decreased knowledge of use of DME;Coordination    Rehab Potential Fair    Clinical Decision Making Several treatment options, min-mod task modification necessary    Comorbidities Affecting Occupational Performance: May have comorbidities impacting occupational performance    Modification or Assistance to Complete Evaluation  Min-Moderate modification of tasks or assist with assess necessary to complete eval    OT Frequency 2x / week    OT Duration 12 weeks    OT Treatment/Interventions Self-care/ADL training;Neuromuscular education;Electrical Stimulation;Patient/family education;Therapeutic activities;Passive range of motion;Therapist, nutritional;Therapeutic exercise;DME and/or AE instruction;Moist Heat    Consulted and Agree with Plan of Care Patient             Harrel Carina, MS, OTR/L  Harrel Carina, OT 09/20/2022, 12:49 PM

## 2022-09-22 ENCOUNTER — Ambulatory Visit: Payer: Medicare Other

## 2022-09-22 ENCOUNTER — Encounter: Payer: Medicare Other | Admitting: Speech Pathology

## 2022-09-22 ENCOUNTER — Ambulatory Visit: Payer: Medicare Other | Admitting: Occupational Therapy

## 2022-09-22 DIAGNOSIS — M6281 Muscle weakness (generalized): Secondary | ICD-10-CM

## 2022-09-22 DIAGNOSIS — I63512 Cerebral infarction due to unspecified occlusion or stenosis of left middle cerebral artery: Secondary | ICD-10-CM

## 2022-09-22 DIAGNOSIS — R278 Other lack of coordination: Secondary | ICD-10-CM

## 2022-09-22 DIAGNOSIS — R262 Difficulty in walking, not elsewhere classified: Secondary | ICD-10-CM

## 2022-09-22 DIAGNOSIS — R269 Unspecified abnormalities of gait and mobility: Secondary | ICD-10-CM

## 2022-09-22 DIAGNOSIS — R2681 Unsteadiness on feet: Secondary | ICD-10-CM

## 2022-09-22 NOTE — Therapy (Signed)
OUTPATIENT PHYSICAL THERAPY TREATMENT NOTE Patient Name: Lindsey [name] MRN: [id number] DOB:1956-03-15, 66 y.o., female Today's Date: 09/22/2022  PCP: Dr. Tomasa Hose MD REFERRING PROVIDER: Leeroy Cha MD   PT End of Session - 09/22/22 1102     Visit Number 20    Number of Visits 56    Date for PT Re-Evaluation 10/27/22    Authorization Type Medicare    Authorization Time Period Cert through 4/0/81; Recert 01/05/8184-03/05/1496; Recert 0/11/6376-58/85; Recert 11/09/7410-8/78/6767    Progress Note Due on Visit 52    PT Start Time 1101    PT Stop Time 1131    PT Time Calculation (min) 30 min    Equipment Utilized During Treatment Gait belt    Activity Tolerance Patient tolerated treatment well    Behavior During Therapy WFL for tasks assessed/performed                           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 No pertinent surgical history. 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 ([place]) 09/24/2021   Cerebral embolism with cerebral infarction 09/23/2021   Pyelonephritis 09/19/2021   Pain due to onychomycosis of toenails of both feet 11/19/2020   Normocytic anemia 05/31/2020   Acute ischemic left MCA stroke ([place]) 04/30/2020   Right hemiplegia ([place]) 04/30/2020    Cerebrovascular accident ([place]) 04/21/2020   Atrial fibrillation with RVR ([place]) 04/21/2020   Essential hypertension 04/21/2020   Alcohol abuse 04/21/2020    REFERRING DIAG: s/p CVA  THERAPY DIAG:  Other lack of coordination  Unsteadiness on feet  Abnormality of gait and mobility  Difficulty in walking, not elsewhere classified  Acute ischemic left MCA stroke (HCC)  Muscle weakness (generalized)  Rationale for Evaluation and Treatment Rehabilitation  PERTINENT HISTORY: Pt was doing very well and was walking short distances wqith a cane prior to second stroke in december. At this point she is limited to wheelchair as her main means of mobility. Pt caregiver reports all detailed information. Pt has hesitation with the left lower extremity and significant weakness on the right lower extremity. Pt will take 2-3 steps when transfering. Pt caregiver assists with all transfers.Pt has shower with ledge to steps over ledge when entering and utilizes bedside commode. Pt has 2 level home with chair lift for upstairs access. Pt would like to improve ransfers, improve neglect on her R side and improve her overall strength and mobility.Pt. is a 66 y.o. 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 Pt. has Right sided weakness, and receptive, and expressive aphasia. Pt. has had a previous CVA with right sided weakness in July 2021. PMHx includes: AFib, Renal Artery  thrombosis, situational depression, HTN, Pyelnephritis, Seizure activity, COnstipation, AKI. Vitamin D deficiency, and urinary incontinence.   PRECAUTIONS: fall risk, wearing RLE AFO  SUBJECTIVE:    Pt reports no new concerns or complaints. Denies pain this date.   PAIN:  Are you having pain? No      TODAY'S TREATMENT:  09/22/22  INTERVENTIONS:    NMR:     PROM to right knee into flex/ext 1x10 AROM on L 1x10     X2 Stand pivot transfers min to modA  with LUE support. Requiring assist in positioning RLE prior to transfer from transport chair <> standard chair. Then standard chair <> another standard chair.   Provided hemi walker for LUE support. Stands minA. Multiple STS efforts with pt demonstrating posterior lean and L Lateral lean attempting to progress RLE. Requiring manual facilitation with RLE during swing phase and placing knee into extension in stance phase. Heavy posterior lean in standing requiring MinA behind pt throughout standing. Pt reporting discomfort from gait belt. Appearing to become frustrated or agitated. Unable to express due to her aphasia but does not even attempt to elaborate. Pt returned to sitting in manual W/c. Pt does not elaborate the problem requesting to end session. Author asking pt's grand son if he is aware of pt's source of frustration or agitation leading to pt requesting to end session with grandson stating "sometimes she doesn't feel like doing much". Author anticipates pt most likely frustrated with author's techniques/mobility attempts. Pt assisted back to transfer chair assisting in donning coat.     PATIENT EDUCATION: Education details: Pt educated throughout session about proper posture and technique with exercises. Improved exercise technique, movement at target joints, use of target muscles after min to mod verbal, visual, tactile cues. Continued education on technique with STS.  Person educated: Patient Education method: Explanation, Demonstration, and Verbal cues Education comprehension: verbalized understanding, returned demonstration, and needs further education   HOME EXERCISE PROGRAM: Continue as previously given;    PT Short Term Goals -       PT SHORT TERM GOAL #1   Title Patient will be independent in home exercise program to improve strength/mobility for better functional independence with ADLs.    Baseline no formal HEp for LE/balance at this time; 02/08/2022=Patient continues to  require VC for reminders and assist with hip march/knee ext (seated for strengthening); Reminders to continue to work on static stand at home.    Time 4    Period Weeks    Status Partially Met  04/07/22: complete somewhat regularly     Target Date 12/14/21              PT Long Term Goals -       PT LONG TERM GOAL #1   Title Patient will increase FOTO score to equal to or greater than   41  to demonstrate statistically significant improvement in mobility and quality of life.    Baseline 16 on 2/14; 02/08/2022=Patient unable to complete today- No family present but will attempt again next visit available with family present to assist in completion; 02/17/2022= 40; 06/28/2022- No caregiver present in PT session to assist in answering questions. 08/04/2022= 40    Time 12    Period Weeks    Status On-going    Target Date 10/27/2022       PT LONG TERM GOAL #3   Title Patient  will complete five times sit to stand test in < 30 seconds with UE assistance indicating an  increased LE strength and improved balance.    Baseline 58.47 s with UE assist and with A from PT for R LE positioning; 02/08/2022=54 sec with use of L UE support and Min physical assist to position right LE.  7/6: 30.5 sec, min A for LE positioning, some attempts did not come to full erect posture, did not let go with L UE from armchair with any attempt 05/05/22: 26.65 sec   Time 12    Period Weeks    Status Met    Target Date 05/03/22      PT LONG TERM GOAL #4   Title Patient will increase Berg Balance score by > 6 points to demonstrate decreased fall risk during functional activities.    Baseline 12 on 2/14; 02/08/2022= 13/56  04/07/22: 17  8/3: 19    Time 12    Period Weeks    Status Met    Target Date 05/03/22      PT LONG TERM GOAL #5   Title Pt will transfer from seated position on table/chair to/from transport chair/WC in order to indicate improved transfer ability.    Baseline Requires Min A for LE positioning and to  prevent LOB. 02/08/2022=Patient attempted to perform on her own- Unable to push herself up from chair- Very min assist to position right LE into more flexed position and VC for hand placement. Patient able to stand with min Assist with use of gait belt and VC to lean; 02/17/2022=Patient was able to transfer from transport w/c with some physical assist with Right to swing to pivot to side of chair. She was able to sit to stand today with only CGA and then stand pivot with right arm holding onto arm of chair. Only difficulty was stand to sit- Max VC not to plop into chair. 04/07/22: Pt performed movement of B LE to side of chair independently with chair transfer. Pt required min A with R LE foot placement of floor. Pt requires increased time to complete but complete with CGA only for safety and no cues or A to prevent falling/ plopping into armchair.  8/3: Pt requires min A for maintenance of standig balance, able to navigate from transport chairr to arm chair with hemiwalker, does not controll descent with ahands still " flops" into chair; 08/04/2022- Patient continues to be inconsistent in her transfers- at times only CGA and other more min Assist and repetitive VC. Unclear due to some ongoing cognitive issues if patient will achieve this goal but still presents with potential. 09/08/2022- Patient able to stand with CGA yet continues to require initial VC for proper sequencing.   Time 12    Period Weeks    Status Partially met    Target Date 10/27/2022       PT LONG TERM GOAL #6   Title Patient will ambulate > 150 feet with CGA using Hemiwalker on level surfaces for improved household and short community distances.    Baseline 02/08/2022= 40 feet with use of HW, heavy CGA with max VC for gait sequencing and w/c follow. 5/18 = 85 feet with use of HW, close CGA  7/6: 45 feet, heavy use of hemiwalker 8/3: 85 feet with heavy hemi walker use, no Lob throughout, cues for appropriate weight distribution; 06/28/2022 -  205 feet with use of hemiwalker, CGA; 08/04/2022- Patient presents with inconsistent gait ability - able to walk 150 feet with max effort and increased time using hemiwalker and CGA.    Time 12  Period Weeks    Status On-going    Target Date 10/27/2022        PT LONG TERM GOAL #7   Title Pt will increase 10MWT by at least 0.13 m/s in order to demonstrate clinically significant improvement in community ambulation.    Baseline 02/08/2022= 0.05 m/s using HW 7/6: .029ms with heavy ue use of hemiwalker and CGA for balance 8/3: .0567m 06/28/2022= 0.1 m/s (will keep goal active to ensure she is consistent); 08/04/2022- 0.09 m/s)   Time 12    Period Weeks    Status Ongoing     Target Date 10/27/2022      8.  Pt will improve 5X STS with full knee extension in standing each rep and with appropriate eccentric control in order to indicate improved transfer efficacy and improved LE strength  Baseline: 52.45 sec with UE support; 06/28/2022= 36.03 sec with UE support (VC for erect standing- patient continues to press up using back of legs against chair to stand); 08/04/2022- Patient required 45 sec using left UE and max VC (flopping at times with sitting)  Goal status: ONGOING TARGET date: 10/27/2022  9.  Patient will increase Berg Balance score by > 6 points to demonstrate decreased fall risk during functional activities Baseline: 19; 06/28/2022= 19/56; 08/04/2022- Deferred secondary to patient too fatigued to attempt after walking today. Will reassess next visit. Goal status: ONGOING Target Date: 10/27/2022           Plan -     Clinical Impression Statement Pt arriving to session in normal state of health. Some difficulties with transfers this date and gait attempts. Pt becomes frustrated/agitated. Unsure if with self or author. This leads to pt requesting to end session early. Attempts made to discuss with pt but limited due to her aphasia. But pt does not attempt to try and elaborate source to  author. AuChief Strategy Officerpologizing if source was from PT this date. Will continue POC as able to address pt's R sided deficits and optimize gait.   Personal Factors and Comorbidities Age;Comorbidity 1;Comorbidity 2;Comorbidity 3+;Time since onset of injury/illness/exacerbation;Other    Comorbidities AFib, Renal Artery thrombosis, situational depression, HTN, Pyelnephritis, Seizure activity, COnstipation, AKI    Examination-Activity Limitations Bathing;Bed Mobility;Carry;Locomotion Level;Dressing;Sit;Squat;Stairs;Stand;Toileting;Transfers    Examination-Participation Restrictions Church;Community Activity;Laundry    Stability/Clinical Decision Making Evolving/Moderate complexity    Rehab Potential Fair    PT Frequency 2x / week    PT Duration 12 weeks    PT Treatment/Interventions ADLs/Self Care Home Management;Gait training;Stair training;Therapeutic activities;Therapeutic exercise;Balance training;Neuromuscular re-education;Patient/family education;Wheelchair mobility training;Manual techniques;Energy conservation;Dry needling;Passive range of motion;Taping;Spinal Manipulations;Joint Manipulations    PT Next Visit Plan Gait training, Transfer training, LE strengthening as appropriate.    PT Home Exercise Plan HEP: general LE and postural exercises    Consulted and Agree with Plan of Care Patient            MiSalem CasterFairly IV, PT, DPT Physical Therapist- [facility]12/21/23, 12:45 PM

## 2022-09-24 ENCOUNTER — Emergency Department (HOSPITAL_COMMUNITY): Payer: Medicare Other

## 2022-09-24 ENCOUNTER — Other Ambulatory Visit: Payer: Self-pay

## 2022-09-24 ENCOUNTER — Emergency Department (HOSPITAL_COMMUNITY)
Admission: EM | Admit: 2022-09-24 | Discharge: 2022-09-24 | Disposition: A | Discharge status: Home / Self Care | Payer: Medicare Other | Attending: Emergency Medicine | Admitting: Emergency Medicine

## 2022-09-24 DIAGNOSIS — Z7982 Long term (current) use of aspirin: Secondary | ICD-10-CM | POA: Diagnosis not present

## 2022-09-24 DIAGNOSIS — W19XXXA Unspecified fall, initial encounter: Secondary | ICD-10-CM

## 2022-09-24 DIAGNOSIS — Z7901 Long term (current) use of anticoagulants: Secondary | ICD-10-CM | POA: Insufficient documentation

## 2022-09-24 DIAGNOSIS — W01198A Fall on same level from slipping, tripping and stumbling with subsequent striking against other object, initial encounter: Secondary | ICD-10-CM | POA: Insufficient documentation

## 2022-09-24 DIAGNOSIS — R4701 Aphasia: Secondary | ICD-10-CM | POA: Insufficient documentation

## 2022-09-24 LAB — COMPREHENSIVE METABOLIC PANEL
ALT: 13 U/L (ref 0–44)
AST: 21 U/L (ref 15–41)
Albumin: 3 g/dL — ABNORMAL LOW (ref 3.5–5.0)
Alkaline Phosphatase: 53 U/L (ref 38–126)
Anion gap: 11 (ref 5–15)
BUN: 13 mg/dL (ref 8–23)
CO2: 19 mmol/L — ABNORMAL LOW (ref 22–32)
Calcium: 9.1 mg/dL (ref 8.9–10.3)
Chloride: 111 mmol/L (ref 98–111)
Creatinine, Ser: 1 mg/dL (ref 0.44–1.00)
GFR, Estimated: >60 mL/min (ref >60)
Glucose, Bld: 82 mg/dL (ref 70–99)
Potassium: 4.3 mmol/L (ref 3.5–5.1)
Sodium: 141 mmol/L (ref 135–145)
Total Bilirubin: 0.5 mg/dL (ref 0.3–1.2)
Total Protein: 7.1 g/dL (ref 6.5–8.1)

## 2022-09-24 LAB — CBC WITH DIFFERENTIAL/PLATELET
Abs Immature Granulocytes: 0.02 10*3/uL (ref 0.00–0.07)
Basophils Absolute: 0 10*3/uL (ref 0.0–0.1)
Basophils Relative: 0 %
Eosinophils Absolute: 0.1 10*3/uL (ref 0.0–0.5)
Eosinophils Relative: 2 %
HCT: 30.3 % — ABNORMAL LOW (ref 36.0–46.0)
Hemoglobin: 10 g/dL — ABNORMAL LOW (ref 12.0–15.0)
Immature Granulocytes: 0 %
Lymphocytes Relative: 29 %
Lymphs Abs: 1.6 10*3/uL (ref 0.7–4.0)
MCH: 29.6 pg (ref 26.0–34.0)
MCHC: 33 g/dL (ref 30.0–36.0)
MCV: 89.6 fL (ref 80.0–100.0)
Monocytes Absolute: 0.8 10*3/uL (ref 0.1–1.0)
Monocytes Relative: 15 %
Neutro Abs: 3.1 10*3/uL (ref 1.7–7.7)
Neutrophils Relative %: 54 %
Platelets: 278 10*3/uL (ref 150–400)
RBC: 3.38 MIL/uL — ABNORMAL LOW (ref 3.87–5.11)
RDW: 16.4 % — ABNORMAL HIGH (ref 11.5–15.5)
WBC: 5.7 10*3/uL (ref 4.0–10.5)
nRBC: 0 % (ref 0.0–0.2)

## 2022-09-24 LAB — PROTIME-INR
INR: 2.5 — ABNORMAL HIGH (ref 0.8–1.2)
Prothrombin Time: 27.1 seconds — ABNORMAL HIGH (ref 11.4–15.2)

## 2022-09-24 NOTE — ED Notes (Signed)
Patient transported to CT 

## 2022-09-24 NOTE — ED Notes (Addendum)
Unable to place PIV for labs x2. US guided PIV RN will place one when able to.

## 2022-09-24 NOTE — ED Notes (Signed)
Pt placed on bedpan to void. Pt & bed cleaned, and place into clean gown and bed sheets. AND noted at this time.

## 2022-09-24 NOTE — ED Provider Notes (Signed)
  Physical Exam  BP (!) 180/140 (BP Location: Left Arm)   Pulse 77   Temp 99 F (37.2 C) (Oral)   Resp 18   Ht 5\' 5"  (1.651 m)   Wt 63.5 kg   SpO2 100%   BMI 23.30 kg/m    Procedures  Procedures  ED Course / MDM    Medical Decision Making Amount and/or Complexity of Data Reviewed Labs: ordered. Radiology: ordered.   58F fell on [facility], is on coumadin, needs labs, imaging negative. Pt initial CBC and BMP reassuring. INR pending. IV consult placed.       [facility], MD 09/24/22 [phone]

## 2022-09-24 NOTE — ED Provider Notes (Signed)
[facility] EMERGENCY DEPARTMENT Provider Note   CSN: [id number] Arrival date & time: 09/24/22  3846     History  Chief Complaint  Patient presents with   Fall    From standing, on face kitchen counter & shoulder to floor, on blood thinners    Lindsey [name] is a 66 y.o. female.  The history is provided by the EMS personnel and medical records.  Lindsey [name] is a 66 y.o. female who presents to the Emergency Department complaining of fall.  She presents to the ED as a level II fall on thinners.  She is [facility] [facility] and takes warfarin.  Had a fall (unclear if witnessed).  Pt on warfarin, has a hx/o prior CVA with right hemiparesis and aphasia.  No reports of recent illnesses.  Pt without acute complaints.       Home Medications Prior to Admission medications   Medication Sig Start Date End Date Taking? Authorizing Provider  acetaminophen (TYLENOL) 325 MG tablet Take 1-2 tablets (325-650 mg total) by mouth every 4 (four) hours as needed for mild pain. Patient taking differently: Take 650 mg by mouth every 4 (four) hours as needed for mild pain. 10/21/21  Yes Setzer, Lynnell Jude, PA-C  aspirin 81 MG chewable tablet Chew 81 mg by mouth daily as needed for moderate pain. 10/25/21  Yes [provider]  Baclofen 5 MG TABS TAKE 1 TABLET BY MOUTH AT BEDTIME Patient taking differently: Take 5 mg by mouth at bedtime. 02/09/22  Yes Raulkar, Drema Pry, MD  gabapentin (NEURONTIN) 100 MG capsule Take 100 mg by mouth daily. 02/25/22  Yes [provider]  levETIRAcetam (KEPPRA) 500 MG tablet TAKE 1 TABLET BY MOUTH EVERY 12 HOURS. Patient taking differently: Take 500 mg by mouth 2 (two) times daily. 02/09/22  Yes Raulkar, Drema Pry, MD  megestrol (MEGACE) 400 MG/10ML suspension Take 10 mLs (400 mg total) by mouth daily. Patient taking differently: Take 400 mg by mouth at bedtime. 11/15/21  Yes Raulkar, Drema Pry, MD  metoprolol succinate (TOPROL-XL) 50 MG 24 hr  tablet Take 50 mg by mouth 2 (two) times daily. 08/08/22  Yes [provider]  Multiple Vitamins-Minerals (MULTIVITAMIN ADULTS 50+) TABS Take 1 tablet by mouth daily.   Yes [provider]  nystatin (MYCOSTATIN/NYSTOP) powder Apply 1 Application topically 2 (two) times daily. 09/15/22  Yes [provider]  sertraline (ZOLOFT) 25 MG tablet TAKE 1 TABLET BY MOUTH AT BEDTIME Patient taking differently: Take 50 mg by mouth daily. 02/09/22  Yes Raulkar, Drema Pry, MD  Vitamin D, Ergocalciferol, (DRISDOL) 1.25 MG (50000 UNIT) CAPS capsule Take 1 capsule (50,000 Units total) by mouth every 7 (seven) days. Each Tuesday Patient taking differently: Take 50,000 Units by mouth every 7 (seven) days. Each Monday 11/19/21  Yes Raulkar, Drema Pry, MD  warfarin (COUMADIN) 2.5 MG tablet TAKE 1 TABLET BY MOUTH ONCE DAILY AT 4:00 P.M. Patient taking differently: Take 7.5 mg by mouth See admin instructions. On Sun, Tue, Thu, Fri, Sat 02/14/22  Yes Raulkar, Drema Pry, MD  atorvastatin (LIPITOR) 20 MG tablet Take 1 tablet (20 mg total) by mouth daily at 6 PM. Patient not taking: Reported on 09/24/2022 06/29/21   Horton Chin, MD  valsartan (DIOVAN) 40 MG tablet Take 1 tablet (40 mg total) by mouth daily. Patient not taking: Reported on 08/10/2022 06/02/22   Horton Chin, MD      Allergies    Patient has no known allergies.  Review of Systems   Review of Systems  All other systems reviewed and are negative.   Physical Exam Updated Vital Signs BP (!) 179/102   Pulse 69   Temp 99 F (37.2 C) (Oral)   Resp 17   Ht 5\' 5"  (1.651 m)   Wt 63.5 kg   SpO2 100%   BMI 23.30 kg/m  Physical Exam Vitals and nursing note reviewed.  Constitutional:      Appearance: She is well-developed.  HENT:     Head: Normocephalic and atraumatic.  Cardiovascular:     Rate and Rhythm: Normal rate and regular rhythm.     Heart sounds: No murmur heard. Pulmonary:     Effort: Pulmonary effort  is normal. No respiratory distress.     Breath sounds: Normal breath sounds.  Abdominal:     Palpations: Abdomen is soft.     Tenderness: There is no abdominal tenderness. There is no guarding or rebound.  Musculoskeletal:        General: No swelling or tenderness.     Comments: No tenderness to arms/legs/hips.    Skin:    General: Skin is warm and dry.  Neurological:     Mental Status: She is alert.     Comments: Dense aphasia.  Nods/states yes or no.  Right hemiparesis.  Alert and follows simple commands.    Psychiatric:        Behavior: Behavior normal.     ED Results / Procedures / Treatments   Labs (all labs ordered are listed, but only abnormal results are displayed) Labs Reviewed  COMPREHENSIVE METABOLIC PANEL  CBC WITH DIFFERENTIAL/PLATELET  PROTIME-INR    EKG None  Radiology CT Head Wo Contrast  Result Date: 09/24/2022 CLINICAL DATA:  Head trauma, minor (Age >= 65y) EXAM: CT HEAD WITHOUT CONTRAST TECHNIQUE: Contiguous axial images were obtained from the base of the skull through the vertex without intravenous contrast. RADIATION DOSE REDUCTION: This exam was performed according to the departmental dose-optimization program which includes automated exposure control, adjustment of the mA and/or kV according to patient size and/or use of iterative reconstruction technique. COMPARISON:  09/23/2021. FINDINGS: Brain: There is periventricular white matter decreased attenuation consistent with small vessel ischemic changes. Ventricles, sulci and cisterns are prominent consistent with age related involutional changes. No acute intracranial hemorrhage, mass effect or shift. No hydrocephalus. Areas of encephalomalacia identified consistent with chronic left cerebellar enlarged MCA CVAs. Vascular: No hyperdense vessel or unexpected calcification. Skull: Normal. Negative for fracture or focal lesion. Sinuses/Orbits: No acute finding. IMPRESSION: Atrophy and chronic small vessel ischemic  changes. Chronic left-sided CVAs. No acute intracranial process identified. Electronically Signed   By: Sammie Bench M.D.   On: 09/24/2022 10:43   CT Cervical Spine Wo Contrast  Result Date: 09/24/2022 CLINICAL DATA:  Neck trauma EXAM: CT CERVICAL SPINE WITHOUT CONTRAST TECHNIQUE: Multidetector CT imaging of the cervical spine was performed without intravenous contrast. Multiplanar CT image reconstructions were also generated. RADIATION DOSE REDUCTION: This exam was performed according to the departmental dose-optimization program which includes automated exposure control, adjustment of the mA and/or kV according to patient size and/or use of iterative reconstruction technique. COMPARISON:  None Available. FINDINGS: Alignment: Normal. Skull base and vertebrae: No acute fracture. No primary bone lesion or focal pathologic process. Soft tissues and spinal canal: No prevertebral fluid or swelling. No visible canal hematoma. Disc levels: Disc space narrowing with marginal osteophyte formation identified C3-4 through C6-7. 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 Upper chest: Negative. Other: None. IMPRESSION: Multilevel  degenerative changes.  No acute traumatic abnormalities. Electronically Signed   By: Layla Maw M.D.   On: 09/24/2022 10:41   DG Pelvis Portable  Result Date: 09/24/2022 CLINICAL DATA:  Pain after fall EXAM: PORTABLE PELVIS 1-2 VIEWS COMPARISON:  None Available. FINDINGS: There is no evidence of pelvic fracture or diastasis. No pelvic bone lesions are seen. IMPRESSION: Negative. Electronically Signed   By: Gerome Sam III M.D.   On: 09/24/2022 10:08   DG Chest Port 1 View  Result Date: 09/24/2022 CLINICAL DATA:  Fall. EXAM: PORTABLE CHEST 1 VIEW COMPARISON:  None Available. FINDINGS: Stable cardiomegaly. The hila and mediastinum are unchanged. No pneumothorax. Elevation the right hemidiaphragm remains. No nodule, mass, or focal infiltrate. No overt edema. IMPRESSION: No active disease.  Electronically Signed   By: Gerome Sam III M.D.   On: 09/24/2022 10:07    Procedures Procedures    Medications Ordered in ED Medications - No data to display  ED Course/ Medical Decision Making/ A&P                           Medical Decision Making Amount and/or Complexity of Data Reviewed Labs: ordered. Radiology: ordered.   Pt here for evaluation of injuries following a fall.  She is awake and alert but has aphasia, which limits history taking.  No clear external evidence of trauma on exam.  Neurologic exam is at baseline per records.  CT head/cspine are negative for acute abnormality.  Given anticoagulation and patient's aphasia recommend labs to r/o coagulopathy or severe electrolyte derangement.  Pt care transferred pending labs.         Final Clinical Impression(s) / ED Diagnoses Final diagnoses:  None    Rx / DC Orders ED Discharge Orders     None         Tilden Fossa, MD 09/24/22 1236

## 2022-09-24 NOTE — ED Triage Notes (Signed)
Pt BIB EMS from [facility] after falling from standing without LOC. Pt's face made contact with kitchen counter, and right shoulder made contact with floor. Pt on blood thinners. HX of CVA with baseline right sided deficit. Pt on blood thinner- warfarin. PT presents with aphasia.

## 2022-09-24 NOTE — ED Notes (Signed)
X-ray at bedside

## 2022-09-24 NOTE — Progress Notes (Signed)
Orthopedic Tech Progress Note Patient Details:  Lindsey [name] [birth year] [id number]  Patient ID: Lindsey [name], female   DOB: 1956/08/09, 66 y.o.   MRN: [id number] Level II; not currently needed. Lindsey [name] 09/24/2022, 10:07 AM

## 2022-09-24 NOTE — ED Notes (Signed)
This RN reviewed discharge instructions with patient and family member. Daughter verbalized understanding and denied any further questions. PIV removed. Pt well appearing and transported to car in wheelchair.

## 2022-09-24 NOTE — Discharge Instructions (Signed)
CT scans and x-rays do not show any significant traumatic injury.  Take your medications as prescribed and follow-up with your doctor.  Return to the ED with new or worsening symptoms.

## 2022-09-24 NOTE — ED Provider Notes (Signed)
Care signed out to me pending INR.  INR is therapeutic at 2.5.  Traumatic imaging was negative.  Electrolytes are reassuring.  Family at bedside states she is at baseline.   Glynn Octave, MD 09/24/22 1655

## 2022-09-27 ENCOUNTER — Ambulatory Visit: Payer: Medicare Other

## 2022-09-27 ENCOUNTER — Encounter: Payer: Medicare Other | Admitting: Occupational Therapy

## 2022-09-27 ENCOUNTER — Ambulatory Visit: Payer: Medicare Other | Admitting: Physical Therapy

## 2022-09-27 ENCOUNTER — Encounter: Payer: Medicare Other | Admitting: Speech Pathology

## 2022-09-27 ENCOUNTER — Ambulatory Visit: Payer: Medicare Other | Admitting: Occupational Therapy

## 2022-09-27 DIAGNOSIS — R269 Unspecified abnormalities of gait and mobility: Secondary | ICD-10-CM

## 2022-09-27 DIAGNOSIS — R482 Apraxia: Secondary | ICD-10-CM

## 2022-09-27 DIAGNOSIS — I63542 Cerebral infarction due to unspecified occlusion or stenosis of left cerebellar artery: Secondary | ICD-10-CM

## 2022-09-27 DIAGNOSIS — R262 Difficulty in walking, not elsewhere classified: Secondary | ICD-10-CM

## 2022-09-27 DIAGNOSIS — I63512 Cerebral infarction due to unspecified occlusion or stenosis of left middle cerebral artery: Secondary | ICD-10-CM

## 2022-09-27 DIAGNOSIS — M6281 Muscle weakness (generalized): Secondary | ICD-10-CM

## 2022-09-27 DIAGNOSIS — R2681 Unsteadiness on feet: Secondary | ICD-10-CM

## 2022-09-27 DIAGNOSIS — R278 Other lack of coordination: Secondary | ICD-10-CM

## 2022-09-27 DIAGNOSIS — R2689 Other abnormalities of gait and mobility: Secondary | ICD-10-CM

## 2022-09-27 NOTE — Therapy (Signed)
OUTPATIENT PHYSICAL THERAPY TREATMENT NOTE Patient Name: Lindsey [name] MRN: [id number] DOB:[birth year], 66 y.o., female Today's Date: 09/27/2022  PCP: Dr. Tomasa Hose MD REFERRING PROVIDER: Leeroy Cha MD   PT End of Session - 09/27/22 1113     Visit Number 53    Number of Visits 49    Date for PT Re-Evaluation 10/27/22    Authorization Type Medicare    Authorization Time Period Cert through 03/05/88; Recert 12/08/3426-04/07/8114; Recert 04/03/6202-55/97; Recert 41/03/3844-3/64/6803    Progress Note Due on Visit 38    PT Start Time 1105    PT Stop Time 1148    PT Time Calculation (min) 43 min    Equipment Utilized During Treatment Gait belt    Activity Tolerance Patient tolerated treatment well    Behavior During Therapy WFL for tasks assessed/performed                           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 No pertinent surgical history. 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 ([place]) 09/24/2021   Cerebral embolism with cerebral infarction 09/23/2021   Pyelonephritis 09/19/2021   Pain due to onychomycosis of toenails of both feet 11/19/2020   Normocytic anemia 05/31/2020   Acute ischemic left MCA stroke ([place]) 04/30/2020   Right hemiplegia ([place]) 04/30/2020    Cerebrovascular accident ([place]) 04/21/2020   Atrial fibrillation with RVR ([place]) 04/21/2020   Essential hypertension 04/21/2020   Alcohol abuse 04/21/2020    REFERRING DIAG: s/p CVA  THERAPY DIAG:  Other lack of coordination  Unsteadiness on feet  Abnormality of gait and mobility  Difficulty in walking, not elsewhere classified  Acute ischemic left MCA stroke (HCC)  Muscle weakness (generalized)  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 At this point she is limited to wheelchair as her main means of mobility. Pt caregiver reports all detailed information. Pt has hesitation with the left lower extremity and significant weakness on the right lower extremity. Pt will take 2-3 steps when transfering. Pt caregiver assists with all transfers.Pt has shower with ledge to steps over ledge when entering and utilizes bedside commode. Pt has 2 level home with chair lift for upstairs access. Pt would like to improve ransfers, improve neglect on her R side and improve her overall strength and mobility.Pt. is a 66 y.o. 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 Pt. has Right sided weakness,  and receptive, and expressive aphasia. Pt. has had a previous CVA with right sided weakness in July 2021. PMHx includes: AFib, Renal Artery thrombosis, situational depression, HTN, Pyelnephritis, Seizure activity, COnstipation, AKI. Vitamin D deficiency, and urinary incontinence.   PRECAUTIONS: fall risk, wearing RLE AFO  SUBJECTIVE:    Pt daughter did report patient had a fall at her residence at [facility] and went to ED for precaution- No injuries-  Basically went because she is on a blood thinner. Reports she thinks she was up transfering.   PAIN:  Are you having pain? No      TODAY'S TREATMENT:  09/27/22  INTERVENTIONS:    NMR:     PROM to right knee into flex/ext 1x10 AROM on L 1x10     Patient performed sit to stand from straight back chair with left UE support (VC to position right LE) x 6 trials- VC to look ahead and keep left UE on armrest.   Patient performed standing in // bars attempting to equal weight shift with emphasis to shift more toward affected right side - static stand x several min while talking about Christmas and home life and later performed overhead left UE - waving and raises x 15 reps.   Patient ambulated down and back in // bars several times with multiple turns  with some difficulty advancing Right LE today. VC to look up and remain upright.   Activity Description: step tap  with left foot to facilitate weight bearing on right LE in // bars with LUE support Activity Setting:  The Blaze Pod Random setting was chosen to enhance cognitive processing and agility, providing an unpredictable environment to simulate real-world scenarios, and fostering quick reactions and adaptability.   Number of Pods:  3 Cycles/Sets:  6 Duration (Time or Hit Count):  10 hit count  Patient Stats  Reaction Time:  1) 13.49 sec 2) 11.43 sec 3) 12.12 sec 4) 9.69 sec 5) 15.56 sec (yet fatigued with difficulty standing on right LE)  and 6) 18.99 sec    PATIENT EDUCATION: Education details: Pt educated throughout session about proper posture and technique with exercises. Improved exercise technique, movement at target joints, use of target muscles after min to mod verbal, visual, tactile cues. Continued education on technique with STS.  Person educated: Patient Education method: Explanation, Demonstration, and Verbal cues Education comprehension: verbalized understanding, returned demonstration, and needs further  education   HOME EXERCISE PROGRAM: Continue as previously given;    PT Short Term Goals -       PT SHORT TERM GOAL #1   Title Patient will be independent in home exercise program to improve strength/mobility for better functional independence with ADLs.    Baseline no formal HEp for LE/balance at this time; 02/08/2022=Patient continues to require VC for reminders and assist with hip march/knee ext (seated for strengthening); Reminders to continue to work on static stand at home.    Time 4    Period Weeks    Status Partially Met  04/07/22: complete somewhat regularly     Target Date 12/14/21              PT Long Term Goals -       PT LONG TERM GOAL #1   Title Patient will increase FOTO score to equal to or greater than   41  to demonstrate statistically significant improvement in mobility and quality of life.    Baseline 16 on 2/14; 02/08/2022=Patient unable to complete today- No family  present but will attempt again next visit available with family present to assist in completion; 02/17/2022= 40; 06/28/2022- No caregiver present in PT session to assist in answering questions. 08/04/2022= 40    Time 12    Period Weeks    Status On-going    Target Date 10/27/2022       PT LONG TERM GOAL #3   Title Patient  will complete five times sit to stand test in < 30 seconds with UE assistance indicating an increased LE strength and improved balance.    Baseline 58.47 s with UE assist and with A from PT for R LE positioning; 02/08/2022=54 sec with use of L UE support and Min physical assist to position right LE.  7/6: 30.5 sec, min A for LE positioning, some attempts did not come to full erect posture, did not let go with L UE from armchair with any attempt 05/05/22: 26.65 sec   Time 12    Period Weeks    Status Met    Target Date 05/03/22      PT LONG TERM GOAL #4   Title Patient will increase Berg Balance score by > 6 points to demonstrate decreased fall risk during functional activities.     Baseline 12 on 2/14; 02/08/2022= 13/56  04/07/22: 17  8/3: 19    Time 12    Period Weeks    Status Met    Target Date 05/03/22      PT LONG TERM GOAL #5   Title Pt will transfer from seated position on table/chair to/from transport chair/WC in order to indicate improved transfer ability.    Baseline Requires Min A for LE positioning and to prevent LOB. 02/08/2022=Patient attempted to perform on her own- Unable to push herself up from chair- Very min assist to position right LE into more flexed position and VC for hand placement. Patient able to stand with min Assist with use of gait belt and VC to lean; 02/17/2022=Patient was able to transfer from transport w/c with some physical assist with Right to swing to pivot to side of chair. She was able to sit to stand today with only CGA and then stand pivot with right arm holding onto arm of chair. Only difficulty was stand to sit- Max VC not to plop into chair. 04/07/22: Pt performed movement of B LE to side of chair independently with chair transfer. Pt required min A with R LE foot placement of floor. Pt requires increased time to complete but complete with CGA only for safety and no cues or A to prevent falling/ plopping into armchair.  8/3: Pt requires min A for maintenance of standig balance, able to navigate from transport chairr to arm chair with hemiwalker, does not controll descent with ahands still " flops" into chair; 08/04/2022- Patient continues to be inconsistent in her transfers- at times only CGA and other more min Assist and repetitive VC. Unclear due to some ongoing cognitive issues if patient will achieve this goal but still presents with potential. 09/08/2022- Patient able to stand with CGA yet continues to require initial VC for proper sequencing.   Time 12    Period Weeks    Status Partially met    Target Date 10/27/2022       PT LONG TERM GOAL #6   Title Patient will ambulate > 150 feet with CGA using Hemiwalker on level surfaces for  improved household and short community distances.    Baseline 02/08/2022= 40 feet with use of HW,  heavy CGA with max VC for gait sequencing and w/c follow. 5/18 = 85 feet with use of HW, close CGA  7/6: 45 feet, heavy use of hemiwalker 8/3: 85 feet with heavy hemi walker use, no Lob throughout, cues for appropriate weight distribution; 06/28/2022 - 205 feet with use of hemiwalker, CGA; 08/04/2022- Patient presents with inconsistent gait ability - able to walk 150 feet with max effort and increased time using hemiwalker and CGA.    Time 12    Period Weeks    Status On-going    Target Date 10/27/2022        PT LONG TERM GOAL #7   Title Pt will increase 10MWT by at least 0.13 m/s in order to demonstrate clinically significant improvement in community ambulation.    Baseline 02/08/2022= 0.05 m/s using HW 7/6: .057ms with heavy ue use of hemiwalker and CGA for balance 8/3: .0582m 06/28/2022= 0.1 m/s (will keep goal active to ensure she is consistent); 08/04/2022- 0.09 m/s)   Time 12    Period Weeks    Status Ongoing     Target Date 10/27/2022      8.  Pt will improve 5X STS with full knee extension in standing each rep and with appropriate eccentric control in order to indicate improved transfer efficacy and improved LE strength  Baseline: 52.45 sec with UE support; 06/28/2022= 36.03 sec with UE support (VC for erect standing- patient continues to press up using back of legs against chair to stand); 08/04/2022- Patient required 45 sec using left UE and max VC (flopping at times with sitting)  Goal status: ONGOING TARGET date: 10/27/2022  9.  Patient will increase Berg Balance score by > 6 points to demonstrate decreased fall risk during functional activities Baseline: 19; 06/28/2022= 19/56; 08/04/2022- Deferred secondary to patient too fatigued to attempt after walking today. Will reassess next visit. Goal status: ONGOING Target Date: 10/27/2022           Plan -     Clinical Impression Statement  Patient presented with daughter today and no complaints after reporting fall over the weekend. Patient continues to struggle more than usual with advancing Right LE with walking even utilizing // bars today. She did demonstrate some learning with improved reaction time with balance pods yet struggle to shift weight onto affected right side and fatigued quickly. Will continue POC as able to address pt's R sided deficits and optimize gait.   Personal Factors and Comorbidities Age;Comorbidity 1;Comorbidity 2;Comorbidity 3+;Time since onset of injury/illness/exacerbation;Other    Comorbidities AFib, Renal Artery thrombosis, situational depression, HTN, Pyelnephritis, Seizure activity, COnstipation, AKI    Examination-Activity Limitations Bathing;Bed Mobility;Carry;Locomotion Level;Dressing;Sit;Squat;Stairs;Stand;Toileting;Transfers    Examination-Participation Restrictions Church;Community Activity;Laundry    Stability/Clinical Decision Making Evolving/Moderate complexity    Rehab Potential Fair    PT Frequency 2x / week    PT Duration 12 weeks    PT Treatment/Interventions ADLs/Self Care Home Management;Gait training;Stair training;Therapeutic activities;Therapeutic exercise;Balance training;Neuromuscular re-education;Patient/family education;Wheelchair mobility training;Manual techniques;Energy conservation;Dry needling;Passive range of motion;Taping;Spinal Manipulations;Joint Manipulations    PT Next Visit Plan Gait training, Transfer training, LE strengthening as appropriate.    PT Home Exercise Plan HEP: general LE and postural exercises    Consulted and Agree with Plan of Care Patient             JeOllen BowlPT Physical Therapist- CoTripler Army Medical Center12/26/23, 12:54 PM

## 2022-09-29 ENCOUNTER — Ambulatory Visit: Payer: Medicare Other | Admitting: Physical Therapy

## 2022-09-29 ENCOUNTER — Encounter: Payer: Medicare Other | Admitting: Occupational Therapy

## 2022-09-29 ENCOUNTER — Ambulatory Visit: Payer: Medicare Other | Admitting: Occupational Therapy

## 2022-09-29 ENCOUNTER — Encounter: Payer: Medicare Other | Admitting: Speech Pathology

## 2022-10-04 ENCOUNTER — Ambulatory Visit: Payer: Medicare Other | Attending: Physical Medicine and Rehabilitation

## 2022-10-04 ENCOUNTER — Ambulatory Visit: Payer: Medicare Other | Admitting: Physical Therapy

## 2022-10-04 ENCOUNTER — Encounter: Payer: Medicare Other | Admitting: Speech Pathology

## 2022-10-04 ENCOUNTER — Encounter: Payer: Medicare Other | Admitting: Occupational Therapy

## 2022-10-04 ENCOUNTER — Ambulatory Visit: Payer: Medicare Other | Admitting: Occupational Therapy

## 2022-10-04 DIAGNOSIS — R269 Unspecified abnormalities of gait and mobility: Secondary | ICD-10-CM | POA: Diagnosis present

## 2022-10-04 DIAGNOSIS — I63512 Cerebral infarction due to unspecified occlusion or stenosis of left middle cerebral artery: Secondary | ICD-10-CM | POA: Diagnosis present

## 2022-10-04 DIAGNOSIS — R2689 Other abnormalities of gait and mobility: Secondary | ICD-10-CM | POA: Diagnosis present

## 2022-10-04 DIAGNOSIS — M6281 Muscle weakness (generalized): Secondary | ICD-10-CM | POA: Diagnosis present

## 2022-10-04 DIAGNOSIS — R262 Difficulty in walking, not elsewhere classified: Secondary | ICD-10-CM

## 2022-10-04 DIAGNOSIS — R2681 Unsteadiness on feet: Secondary | ICD-10-CM

## 2022-10-04 DIAGNOSIS — I63542 Cerebral infarction due to unspecified occlusion or stenosis of left cerebellar artery: Secondary | ICD-10-CM | POA: Insufficient documentation

## 2022-10-04 DIAGNOSIS — R278 Other lack of coordination: Secondary | ICD-10-CM | POA: Diagnosis present

## 2022-10-04 NOTE — Therapy (Signed)
OUTPATIENT PHYSICAL THERAPY TREATMENT NOTE Patient Name: Lindsey [name] MRN: [id number] DOB:Oct 16, 1955, 67 y.o., female Today's Date: 10/04/2022  PCP: Dr. Tomasa Hose MD REFERRING PROVIDER: Leeroy Cha MD   PT End of Session - 10/04/22 1109     Visit Number 86    Number of Visits 66    Date for PT Re-Evaluation 10/27/22    Authorization Type Medicare    Authorization Time Period Cert through 12/07/88; Recert 11/07/971-02/03/2991; Recert 01/02/6833-19/62; Recert 22/06/7988-11/14/9415    Progress Note Due on Visit 79    PT Start Time 1107    PT Stop Time 1145    PT Time Calculation (min) 38 min    Equipment Utilized During Treatment Gait belt    Activity Tolerance Patient tolerated treatment well    Behavior During Therapy WFL for tasks assessed/performed                            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 No pertinent surgical history. 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 ([place]) 09/24/2021   Cerebral embolism with cerebral infarction 09/23/2021   Pyelonephritis 09/19/2021   Pain due to onychomycosis of toenails of both feet 11/19/2020   Normocytic anemia 05/31/2020   Acute ischemic left MCA stroke ([place]) 04/30/2020   Right hemiplegia ([place]) 04/30/2020    Cerebrovascular accident ([place]) 04/21/2020   Atrial fibrillation with RVR ([place]) 04/21/2020   Essential hypertension 04/21/2020   Alcohol abuse 04/21/2020    REFERRING DIAG: s/p CVA  THERAPY DIAG:  Other lack of coordination  Unsteadiness on feet  Abnormality of gait and mobility  Difficulty in walking, not elsewhere classified  Rationale for Evaluation and Treatment Rehabilitation  PERTINENT HISTORY: Pt was doing very well and was walking short distances wqith a cane prior to second stroke in december. At this point she is limited to wheelchair as her main means of mobility. Pt caregiver reports all detailed information. Pt has hesitation with the left lower extremity and significant weakness on the right lower extremity. Pt will take 2-3 steps when transfering. Pt caregiver assists with all transfers.Pt has shower with ledge to steps over ledge when entering and utilizes bedside commode. Pt has 2 level home with chair lift for upstairs access. Pt would like to improve ransfers, improve neglect on her R side and improve her overall strength and mobility.Pt. is a 67 y.o. 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 Pt. has Right sided weakness, and receptive, and expressive aphasia. Pt. has had a previous CVA with right sided weakness in July 2021. PMHx includes: AFib, Renal Artery thrombosis, situational depression, HTN, Pyelnephritis, Seizure activity, COnstipation, AKI. Vitamin  D deficiency, and urinary incontinence.   PRECAUTIONS: fall risk, wearing RLE AFO  SUBJECTIVE:   Patient arrives late to PT session. No falls or LOB since last session.   PAIN:  Are you having pain? No      TODAY'S TREATMENT:  09/27/22  INTERVENTIONS:   Patient performed sit to stand from straight back chair with left UE support (VC to position right LE) x 6 trials- VC to look ahead and keep left UE on armrest. ; x 3 sets; with  airex pad under bottom to help with eccentric control   Patient performed standing in // bars attempting to equal weight shift with emphasis to shift more toward affected right side - static stand x several min while talking about Christmas and home life and later performed overhead left UE - waving and raises x 15 reps.   Patient ambulated forward/backwards in // bars 4x length with one seated rest break.   Seated: RLE flexion AAROM 10x RLE abduction isometric 10x 5 seconds RLE adduction AAROM 10x RLE pf isometric 10x RLE hamstring isometric 10x    PATIENT EDUCATION: Education details: Pt educated throughout session about proper posture and technique with exercises. Improved exercise technique, movement at target joints, use of target muscles after min to mod verbal, visual, tactile cues. Continued education on technique with STS.  Person educated: Patient Education method: Explanation, Demonstration, and Verbal cues Education comprehension: verbalized understanding, returned demonstration, and needs further education   HOME EXERCISE PROGRAM: Continue as previously given;    PT Short Term Goals -       PT SHORT TERM GOAL #1   Title Patient will be independent in home exercise program to improve strength/mobility for better functional independence with ADLs.    Baseline no formal HEp for LE/balance at this time; 02/08/2022=Patient continues to require VC for reminders and assist with hip march/knee ext (seated for strengthening); Reminders to continue to work on static stand at home.    Time 4    Period Weeks    Status Partially Met  04/07/22: complete somewhat regularly     Target Date 12/14/21              PT Long Term Goals -       PT LONG TERM GOAL #1   Title Patient will increase FOTO score to equal to or greater than   41  to demonstrate statistically significant improvement in mobility and quality of life.    Baseline 16 on 2/14; 02/08/2022=Patient unable to  complete today- No family present but will attempt again next visit available with family present to assist in completion; 02/17/2022= 40; 06/28/2022- No caregiver present in PT session to assist in answering questions. 08/04/2022= 40    Time 12    Period Weeks    Status On-going    Target Date 10/27/2022       PT LONG TERM GOAL #3   Title Patient  will complete five times sit to stand test in < 30 seconds with UE assistance indicating an increased LE strength and improved balance.    Baseline 58.47 s with UE assist and with A from PT for R LE positioning; 02/08/2022=54 sec with use of L UE support and Min physical assist to position right LE.  7/6: 30.5 sec, min A for LE positioning, some attempts did not come to full erect posture, did not let go with L UE from armchair with any attempt 05/05/22: 26.65 sec   Time 12  Period Weeks    Status Met    Target Date 05/03/22      PT LONG TERM GOAL #4   Title Patient will increase Berg Balance score by > 6 points to demonstrate decreased fall risk during functional activities.    Baseline 12 on 2/14; 02/08/2022= 13/56  04/07/22: 17  8/3: 19    Time 12    Period Weeks    Status Met    Target Date 05/03/22      PT LONG TERM GOAL #5   Title Pt will transfer from seated position on table/chair to/from transport chair/WC in order to indicate improved transfer ability.    Baseline Requires Min A for LE positioning and to prevent LOB. 02/08/2022=Patient attempted to perform on her own- Unable to push herself up from chair- Very min assist to position right LE into more flexed position and VC for hand placement. Patient able to stand with min Assist with use of gait belt and VC to lean; 02/17/2022=Patient was able to transfer from transport w/c with some physical assist with Right to swing to pivot to side of chair. She was able to sit to stand today with only CGA and then stand pivot with right arm holding onto arm of chair. Only difficulty was stand to sit- Max  VC not to plop into chair. 04/07/22: Pt performed movement of B LE to side of chair independently with chair transfer. Pt required min A with R LE foot placement of floor. Pt requires increased time to complete but complete with CGA only for safety and no cues or A to prevent falling/ plopping into armchair.  8/3: Pt requires min A for maintenance of standig balance, able to navigate from transport chairr to arm chair with hemiwalker, does not controll descent with ahands still " flops" into chair; 08/04/2022- Patient continues to be inconsistent in her transfers- at times only CGA and other more min Assist and repetitive VC. Unclear due to some ongoing cognitive issues if patient will achieve this goal but still presents with potential. 09/08/2022- Patient able to stand with CGA yet continues to require initial VC for proper sequencing.   Time 12    Period Weeks    Status Partially met    Target Date 10/27/2022       PT LONG TERM GOAL #6   Title Patient will ambulate > 150 feet with CGA using Hemiwalker on level surfaces for improved household and short community distances.    Baseline 02/08/2022= 40 feet with use of HW, heavy CGA with max VC for gait sequencing and w/c follow. 5/18 = 85 feet with use of HW, close CGA  7/6: 45 feet, heavy use of hemiwalker 8/3: 85 feet with heavy hemi walker use, no Lob throughout, cues for appropriate weight distribution; 06/28/2022 - 205 feet with use of hemiwalker, CGA; 08/04/2022- Patient presents with inconsistent gait ability - able to walk 150 feet with max effort and increased time using hemiwalker and CGA.    Time 12    Period Weeks    Status On-going    Target Date 10/27/2022        PT LONG TERM GOAL #7   Title Pt will increase 10MWT by at least 0.13 m/s in order to demonstrate clinically significant improvement in community ambulation.    Baseline 02/08/2022= 0.05 m/s using HW 7/6: .042ms with heavy ue use of hemiwalker and CGA for balance 8/3: .0563m  06/28/2022= 0.1 m/s (will keep goal active to ensure she  is consistent); 08/04/2022- 0.09 m/s)   Time 12    Period Weeks    Status Ongoing     Target Date 10/27/2022      8.  Pt will improve 5X STS with full knee extension in standing each rep and with appropriate eccentric control in order to indicate improved transfer efficacy and improved LE strength  Baseline: 52.45 sec with UE support; 06/28/2022= 36.03 sec with UE support (VC for erect standing- patient continues to press up using back of legs against chair to stand); 08/04/2022- Patient required 45 sec using left UE and max VC (flopping at times with sitting)  Goal status: ONGOING TARGET date: 10/27/2022  9.  Patient will increase Berg Balance score by > 6 points to demonstrate decreased fall risk during functional activities Baseline: 19; 06/28/2022= 19/56; 08/04/2022- Deferred secondary to patient too fatigued to attempt after walking today. Will reassess next visit. Goal status: ONGOING Target Date: 10/27/2022           Plan -     Clinical Impression Statement Patient is challenged with eccentric control of sit to stand. She is challenged with RLE muscle activation with intermittent ability to perform active contraction requiring AAROM for majority of interventions. Will continue POC as able to address pt's R sided deficits and optimize gait.   Personal Factors and Comorbidities Age;Comorbidity 1;Comorbidity 2;Comorbidity 3+;Time since onset of injury/illness/exacerbation;Other    Comorbidities AFib, Renal Artery thrombosis, situational depression, HTN, Pyelnephritis, Seizure activity, COnstipation, AKI    Examination-Activity Limitations Bathing;Bed Mobility;Carry;Locomotion Level;Dressing;Sit;Squat;Stairs;Stand;Toileting;Transfers    Examination-Participation Restrictions Church;Community Activity;Laundry    Stability/Clinical Decision Making Evolving/Moderate complexity    Rehab Potential Fair    PT Frequency 2x / week    PT  Duration 12 weeks    PT Treatment/Interventions ADLs/Self Care Home Management;Gait training;Stair training;Therapeutic activities;Therapeutic exercise;Balance training;Neuromuscular re-education;Patient/family education;Wheelchair mobility training;Manual techniques;Energy conservation;Dry needling;Passive range of motion;Taping;Spinal Manipulations;Joint Manipulations    PT Next Visit Plan Gait training, Transfer training, LE strengthening as appropriate.    PT Home Exercise Plan HEP: general LE and postural exercises    Consulted and Agree with Plan of Care Patient            Janna Arch PT  Physical Therapist- [facility]  10/04/22, 12:23 PM

## 2022-10-06 ENCOUNTER — Encounter: Payer: Medicare Other | Admitting: Occupational Therapy

## 2022-10-06 ENCOUNTER — Ambulatory Visit: Payer: Medicare Other | Admitting: Physical Therapy

## 2022-10-06 ENCOUNTER — Ambulatory Visit: Payer: Medicare Other

## 2022-10-06 ENCOUNTER — Encounter: Payer: Medicare Other | Admitting: Speech Pathology

## 2022-10-06 DIAGNOSIS — M6281 Muscle weakness (generalized): Secondary | ICD-10-CM

## 2022-10-06 DIAGNOSIS — R278 Other lack of coordination: Secondary | ICD-10-CM | POA: Diagnosis not present

## 2022-10-06 DIAGNOSIS — R262 Difficulty in walking, not elsewhere classified: Secondary | ICD-10-CM

## 2022-10-06 DIAGNOSIS — R269 Unspecified abnormalities of gait and mobility: Secondary | ICD-10-CM

## 2022-10-06 DIAGNOSIS — I63512 Cerebral infarction due to unspecified occlusion or stenosis of left middle cerebral artery: Secondary | ICD-10-CM

## 2022-10-06 DIAGNOSIS — R2681 Unsteadiness on feet: Secondary | ICD-10-CM

## 2022-10-06 NOTE — Therapy (Signed)
OUTPATIENT PHYSICAL THERAPY TREATMENT NOTE Patient Name: Lindsey [name] MRN: [id number] DOB:December 21, 1955, 67 y.o., female Today's Date: 10/06/2022  PCP: Dr. Tomasa Hose MD REFERRING PROVIDER: Leeroy Cha MD   PT End of Session - 10/06/22 1118     Visit Number 90    Number of Visits 66    Date for PT Re-Evaluation 10/27/22    Authorization Type Medicare    Authorization Time Period Cert through 0/2/77; Recert 01/01/2877-03/10/6719; Recert 06/07/7095-28/36; Recert 62/06/4764-4/65/0354    Progress Note Due on Visit 45    PT Start Time Hood During Treatment Gait belt    Activity Tolerance Patient tolerated treatment well    Behavior During Therapy WFL for tasks assessed/performed                            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 No pertinent surgical history. 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 ([place]) 09/24/2021   Cerebral embolism with cerebral infarction 09/23/2021   Pyelonephritis 09/19/2021   Pain due to onychomycosis of toenails of both feet 11/19/2020   Normocytic anemia 05/31/2020   Acute ischemic left MCA stroke ([place]) 04/30/2020   Right hemiplegia ([place]) 04/30/2020   Cerebrovascular accident ([place]) 04/21/2020   Atrial fibrillation  with RVR ([place]) 04/21/2020   Essential hypertension 04/21/2020   Alcohol abuse 04/21/2020    REFERRING DIAG: s/p CVA  THERAPY DIAG:  Other lack of coordination  Unsteadiness on feet  Abnormality of gait and mobility  Difficulty in walking, not elsewhere classified  Acute ischemic left MCA stroke (HCC)  Muscle weakness (generalized)  Rationale for Evaluation and Treatment Rehabilitation  PERTINENT HISTORY: Pt was doing very well and was walking short distances wqith a cane prior to second stroke in december. At this point she is limited to wheelchair as her main means of mobility. Pt caregiver reports all detailed information. Pt has hesitation with the left lower extremity and significant weakness on the right lower extremity. Pt will take 2-3 steps when transfering. Pt caregiver assists with all transfers.Pt has shower with ledge to steps over ledge when entering and utilizes bedside commode. Pt has 2 level home with chair lift for upstairs access. Pt would like to improve ransfers, improve neglect on her R side and improve her overall strength and mobility.Pt. is a 67 y.o. 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 Pt. has Right sided weakness, and receptive, and expressive aphasia. Pt. has had a previous CVA with right sided weakness in July 2021. PMHx includes: AFib, Renal Artery thrombosis, situational depression, HTN, Pyelnephritis, Seizure activity, COnstipation, AKI. Vitamin D deficiency, and urinary incontinence.  PRECAUTIONS: fall risk, wearing RLE AFO  SUBJECTIVE:   Patient arrives late to PT session. No falls or LOB since last session.   PAIN:  Are you having pain? No      TODAY'S TREATMENT:  10/06/2022  INTERVENTIONS:   Theract:  Patient performed sit to stand from edge of mat table with left UE support (VC to position Left UE) x 10 trials- VC to look ahead and keep left UE on  armrest.  Patient performed  static standing at edge of mat- focusing on equal weight shift with emphasis to shift more toward affected right side - static stand x 3 trials: 1) 2 min 2) 1 min 50 sec and 3) 2 min Patient performed standing with dynamic marching x 10 reps each LE. (VC to weight shift over to right LE)  Stand pivot transfer from transport w/c to mat table- (VC for hand placement) - Patient frozen in standing at times requiring some verbal and tactile cues to move LE to pivot and turn. (Constant reminders to reach back with left UE support.)  THEREX:  Seated: (at edge of mat without back support - 1 UE support)   BLE knee ext 2 sets of 12 reps  RLE knee flex- YTB - 2 sets of 12 reps LLE Knee flex- BTB- 2 sets of 12 reps RLE flexion AAROM 10x RLE abduction isometric 10x 5 seconds RLE adduction AAROM 10x  PATIENT EDUCATION: Education details: Pt educated throughout session about proper posture and technique with exercises. Improved exercise technique, movement at target joints, use of target muscles after min to mod verbal, visual, tactile cues. Continued education on technique with STS.  Person educated: Patient Education method: Explanation, Demonstration, and Verbal cues Education comprehension: verbalized understanding, returned demonstration, and needs further education   HOME EXERCISE PROGRAM: Continue as previously given;    PT Short Term Goals -       PT SHORT TERM GOAL #1   Title Patient will be independent in home exercise program to improve strength/mobility for better functional independence with ADLs.    Baseline no formal HEp for LE/balance at this time; 02/08/2022=Patient continues to require VC for reminders and assist with hip march/knee ext (seated for strengthening); Reminders to continue to work on static stand at home.    Time 4    Period Weeks    Status Partially Met  04/07/22: complete somewhat regularly     Target Date 12/14/21               PT Long Term Goals -       PT LONG TERM GOAL #1   Title Patient will increase FOTO score to equal to or greater than   41  to demonstrate statistically significant improvement in mobility and quality of life.    Baseline 16 on 2/14; 02/08/2022=Patient unable to complete today- No family present but will attempt again next visit available with family present to assist in completion; 02/17/2022= 40; 06/28/2022- No caregiver present in PT session to assist in answering questions. 08/04/2022= 40    Time 12    Period Weeks    Status On-going    Target Date 10/27/2022       PT LONG TERM GOAL #3   Title Patient  will complete five times sit to stand test in < 30 seconds with UE assistance indicating an increased LE strength and improved balance.    Baseline 58.47 s with UE assist and with A from PT for R LE  positioning; 02/08/2022=54 sec with use of L UE support and Min physical assist to position right LE.  7/6: 30.5 sec, min A for LE positioning, some attempts did not come to full erect posture, did not let go with L UE from armchair with any attempt 05/05/22: 26.65 sec   Time 12    Period Weeks    Status Met    Target Date 05/03/22      PT LONG TERM GOAL #4   Title Patient will increase Berg Balance score by > 6 points to demonstrate decreased fall risk during functional activities.    Baseline 12 on 2/14; 02/08/2022= 13/56  04/07/22: 17  8/3: 19    Time 12    Period Weeks    Status Met    Target Date 05/03/22      PT LONG TERM GOAL #5   Title Pt will transfer from seated position on table/chair to/from transport chair/WC in order to indicate improved transfer ability.    Baseline Requires Min A for LE positioning and to prevent LOB. 02/08/2022=Patient attempted to perform on her own- Unable to push herself up from chair- Very min assist to position right LE into more flexed position and VC for hand placement. Patient able to stand with min Assist with use of gait belt and VC to lean;  02/17/2022=Patient was able to transfer from transport w/c with some physical assist with Right to swing to pivot to side of chair. She was able to sit to stand today with only CGA and then stand pivot with right arm holding onto arm of chair. Only difficulty was stand to sit- Max VC not to plop into chair. 04/07/22: Pt performed movement of B LE to side of chair independently with chair transfer. Pt required min A with R LE foot placement of floor. Pt requires increased time to complete but complete with CGA only for safety and no cues or A to prevent falling/ plopping into armchair.  8/3: Pt requires min A for maintenance of standig balance, able to navigate from transport chairr to arm chair with hemiwalker, does not controll descent with ahands still " flops" into chair; 08/04/2022- Patient continues to be inconsistent in her transfers- at times only CGA and other more min Assist and repetitive VC. Unclear due to some ongoing cognitive issues if patient will achieve this goal but still presents with potential. 09/08/2022- Patient able to stand with CGA yet continues to require initial VC for proper sequencing.   Time 12    Period Weeks    Status Partially met    Target Date 10/27/2022       PT LONG TERM GOAL #6   Title Patient will ambulate > 150 feet with CGA using Hemiwalker on level surfaces for improved household and short community distances.    Baseline 02/08/2022= 40 feet with use of HW, heavy CGA with max VC for gait sequencing and w/c follow. 5/18 = 85 feet with use of HW, close CGA  7/6: 45 feet, heavy use of hemiwalker 8/3: 85 feet with heavy hemi walker use, no Lob throughout, cues for appropriate weight distribution; 06/28/2022 - 205 feet with use of hemiwalker, CGA; 08/04/2022- Patient presents with inconsistent gait ability - able to walk 150 feet with max effort and increased time using hemiwalker and CGA.    Time 12    Period Weeks    Status On-going    Target Date 10/27/2022         PT LONG TERM  GOAL #7   Title Pt will increase 10MWT by at least 0.13 m/s in order to demonstrate clinically significant improvement in community ambulation.    Baseline 02/08/2022= 0.05 m/s using HW 7/6: .020ms with heavy ue use of hemiwalker and CGA for balance 8/3: .0579m 06/28/2022= 0.1 m/s (will keep goal active to ensure she is consistent); 08/04/2022- 0.09 m/s)   Time 12    Period Weeks    Status Ongoing     Target Date 10/27/2022      8.  Pt will improve 5X STS with full knee extension in standing each rep and with appropriate eccentric control in order to indicate improved transfer efficacy and improved LE strength  Baseline: 52.45 sec with UE support; 06/28/2022= 36.03 sec with UE support (VC for erect standing- patient continues to press up using back of legs against chair to stand); 08/04/2022- Patient required 45 sec using left UE and max VC (flopping at times with sitting)  Goal status: ONGOING TARGET date: 10/27/2022  9.  Patient will increase Berg Balance score by > 6 points to demonstrate decreased fall risk during functional activities Baseline: 19; 06/28/2022= 19/56; 08/04/2022- Deferred secondary to patient too fatigued to attempt after walking today. Will reassess next visit. Goal status: ONGOING Target Date: 10/27/2022           Plan -     Clinical Impression Statement Patient continues to struggle some with safe transfers. At times she can stand with very minimal VC but struggled with turning today. She was able to stand well with some dynamic weight shift - improved toward right side with practice. Will continue POC as able to address pt's R sided deficits and optimize gait.   Personal Factors and Comorbidities Age;Comorbidity 1;Comorbidity 2;Comorbidity 3+;Time since onset of injury/illness/exacerbation;Other    Comorbidities AFib, Renal Artery thrombosis, situational depression, HTN, Pyelnephritis, Seizure activity, COnstipation, AKI    Examination-Activity Limitations  Bathing;Bed Mobility;Carry;Locomotion Level;Dressing;Sit;Squat;Stairs;Stand;Toileting;Transfers    Examination-Participation Restrictions Church;Community Activity;Laundry    Stability/Clinical Decision Making Evolving/Moderate complexity    Rehab Potential Fair    PT Frequency 2x / week    PT Duration 12 weeks    PT Treatment/Interventions ADLs/Self Care Home Management;Gait training;Stair training;Therapeutic activities;Therapeutic exercise;Balance training;Neuromuscular re-education;Patient/family education;Wheelchair mobility training;Manual techniques;Energy conservation;Dry needling;Passive range of motion;Taping;Spinal Manipulations;Joint Manipulations    PT Next Visit Plan Gait training, Transfer training, LE strengthening as appropriate.    PT Home Exercise Plan HEP: general LE and postural exercises    Consulted and Agree with Plan of Care Patient            JeLewis MoccasinT  Physical Therapist- [facility]01/04/24, 11:19 AM

## 2022-10-11 ENCOUNTER — Ambulatory Visit: Payer: Medicare Other

## 2022-10-11 ENCOUNTER — Encounter: Payer: Medicare Other | Admitting: Occupational Therapy

## 2022-10-11 ENCOUNTER — Ambulatory Visit: Payer: Medicare Other | Admitting: Physical Therapy

## 2022-10-11 ENCOUNTER — Encounter: Payer: Medicare Other | Admitting: Speech Pathology

## 2022-10-11 ENCOUNTER — Ambulatory Visit: Payer: Medicare Other | Admitting: Occupational Therapy

## 2022-10-11 DIAGNOSIS — R278 Other lack of coordination: Secondary | ICD-10-CM | POA: Diagnosis not present

## 2022-10-11 DIAGNOSIS — R262 Difficulty in walking, not elsewhere classified: Secondary | ICD-10-CM

## 2022-10-11 DIAGNOSIS — R269 Unspecified abnormalities of gait and mobility: Secondary | ICD-10-CM

## 2022-10-11 DIAGNOSIS — R2681 Unsteadiness on feet: Secondary | ICD-10-CM

## 2022-10-11 DIAGNOSIS — M6281 Muscle weakness (generalized): Secondary | ICD-10-CM

## 2022-10-11 DIAGNOSIS — I63512 Cerebral infarction due to unspecified occlusion or stenosis of left middle cerebral artery: Secondary | ICD-10-CM

## 2022-10-11 NOTE — Therapy (Signed)
OUTPATIENT PHYSICAL THERAPY TREATMENT NOTE Patient Name: Lindsey [name] MRN: [id number] DOB:[birth year], 67 y.o., female Today's Date: 10/11/2022  PCP: Dr. Karie Fetch MD REFERRING PROVIDER: Sula Soda MD   PT End of Session - 10/11/22 1108     Visit Number 68    Number of Visits 79    Date for PT Re-Evaluation 10/27/22    Authorization Type Medicare    Authorization Time Period Cert through 02/08/22; Recert 02/08/2022-05/03/2022; Recert 05/05/2022-10/26; Recert 08/04/2022-10/27/2022    Progress Note Due on Visit 70    PT Start Time 1105    PT Stop Time 1145    PT Time Calculation (min) 40 min    Equipment Utilized During Treatment Gait belt    Activity Tolerance Patient tolerated treatment well    Behavior During Therapy WFL for tasks assessed/performed                             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 No pertinent surgical history. 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) 09/24/2021   Cerebral embolism with cerebral infarction 09/23/2021   Pyelonephritis 09/19/2021   Pain due to onychomycosis of toenails of both feet 11/19/2020   Normocytic anemia 05/31/2020   Acute ischemic left MCA stroke (HCC) 04/30/2020   Right hemiplegia (HCC) 04/30/2020    Cerebrovascular accident (HCC) 04/21/2020   Atrial fibrillation with RVR (HCC) 04/21/2020   Essential hypertension 04/21/2020   Alcohol abuse 04/21/2020    REFERRING DIAG: s/p CVA  THERAPY DIAG:  Other lack of coordination  Unsteadiness on feet  Abnormality of gait and mobility  Difficulty in walking, not elsewhere classified  Acute ischemic left MCA stroke (HCC)  Muscle weakness (generalized)  Rationale for Evaluation and Treatment Rehabilitation  PERTINENT HISTORY: Pt was doing very well and was walking short distances wqith a cane prior to second stroke in december. At this point she is limited to wheelchair as her main means of mobility. Pt caregiver reports all detailed information. Pt has hesitation with the left lower extremity and significant weakness on the right lower extremity. Pt will take 2-3 steps when transfering. Pt caregiver assists with all transfers.Pt has shower with ledge to steps over ledge when entering and utilizes bedside commode. Pt has 2 level home with chair lift for upstairs access. Pt would like to improve ransfers, improve neglect on her R side and improve her overall strength and mobility.Pt. is a 67 y.o. 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 Pt. has Right sided weakness, and receptive, and expressive aphasia. Pt. has had a previous CVA with right sided weakness in July 2021. PMHx includes: AFib,  Renal Artery thrombosis, situational depression, HTN, Pyelnephritis, Seizure activity, COnstipation, AKI. Vitamin D deficiency, and urinary incontinence.   PRECAUTIONS: fall risk, wearing RLE AFO  SUBJECTIVE:   Patient mostly non-verbal but stated no further falls and no complaints today.    PAIN:  Are you having pain? No      TODAY'S TREATMENT:  10/11/2022  INTERVENTIONS:   Theract:   W/C mobility: 10 feet forward and backward x 2 then progressed to w/c mobility going  forward with LE only x 80 feet focusing on heel strike with right heels   Patient performed  static standing at // bars- focusing on equal weight shift with emphasis to shift more toward affected right side - static stand x 5 trials - patient with tendency to lean forward and unable to correct on her own without some VC Patient performed standing with dynamic marching x 10 reps x 2 sets each LE. (VC to weight shift over to right LE)   Gait in // bars x multiple trials - down and back x 2 with constant cues to stand more erect and advance right LE. She continues to present with decreased weight shifting over to affected right side    PATIENT EDUCATION: Education details: Pt educated throughout session about proper posture and technique with exercises. Improved exercise technique, movement at target joints, use of target muscles after min to mod verbal, visual, tactile cues. Continued education on technique with STS.  Person educated: Patient Education method: Explanation, Demonstration, and Verbal cues Education comprehension: verbalized understanding, returned demonstration, and needs further education   HOME EXERCISE PROGRAM: Continue as previously given;    PT Short Term Goals -       PT SHORT TERM GOAL #1   Title Patient will be independent in home exercise program to improve strength/mobility for better functional independence with ADLs.    Baseline no formal HEP for LE/balance at this time; 02/08/2022=Patient continues to require VC for reminders and assist with hip march/knee ext (seated for strengthening); Reminders to continue to work on static stand at home.    Time 4    Period Weeks    Status Partially Met  04/07/22: complete somewhat regularly     Target Date 12/14/21              PT Long Term Goals -       PT LONG TERM GOAL #1   Title Patient will increase FOTO score to equal to or greater than   41  to demonstrate statistically significant improvement in mobility  and quality of life.    Baseline 16 on 2/14; 02/08/2022=Patient unable to complete today- No family present but will attempt again next visit available with family present to assist in completion; 02/17/2022= 40; 06/28/2022- No caregiver present in PT session to assist in answering questions. 08/04/2022= 40    Time 12    Period Weeks    Status On-going    Target Date 10/27/2022       PT LONG TERM GOAL #3   Title Patient  will complete five times sit to stand test in < 30 seconds with UE assistance indicating an increased LE strength and improved balance.    Baseline 58.47 s with UE assist and with A from PT for R LE positioning; 02/08/2022=54 sec with use of L UE support and Min physical assist to position right LE.  7/6: 30.5 sec, min A for LE positioning, some attempts did not come to full erect posture, did  not let go with L UE from armchair with any attempt 05/05/22: 26.65 sec   Time 12    Period Weeks    Status Met    Target Date 05/03/22      PT LONG TERM GOAL #4   Title Patient will increase Berg Balance score by > 6 points to demonstrate decreased fall risk during functional activities.    Baseline 12 on 2/14; 02/08/2022= 13/56  04/07/22: 17  8/3: 19    Time 12    Period Weeks    Status Met    Target Date 05/03/22      PT LONG TERM GOAL #5   Title Pt will transfer from seated position on table/chair to/from transport chair/WC in order to indicate improved transfer ability.    Baseline Requires Min A for LE positioning and to prevent LOB. 02/08/2022=Patient attempted to perform on her own- Unable to push herself up from chair- Very min assist to position right LE into more flexed position and VC for hand placement. Patient able to stand with min Assist with use of gait belt and VC to lean; 02/17/2022=Patient was able to transfer from transport w/c with some physical assist with Right to swing to pivot to side of chair. She was able to sit to stand today with only CGA and then stand pivot with  right arm holding onto arm of chair. Only difficulty was stand to sit- Max VC not to plop into chair. 04/07/22: Pt performed movement of B LE to side of chair independently with chair transfer. Pt required min A with R LE foot placement of floor. Pt requires increased time to complete but complete with CGA only for safety and no cues or A to prevent falling/ plopping into armchair.  8/3: Pt requires min A for maintenance of standig balance, able to navigate from transport chairr to arm chair with hemiwalker, does not controll descent with ahands still " flops" into chair; 08/04/2022- Patient continues to be inconsistent in her transfers- at times only CGA and other more min Assist and repetitive VC. Unclear due to some ongoing cognitive issues if patient will achieve this goal but still presents with potential. 09/08/2022- Patient able to stand with CGA yet continues to require initial VC for proper sequencing.   Time 12    Period Weeks    Status Partially met    Target Date 10/27/2022       PT LONG TERM GOAL #6   Title Patient will ambulate > 150 feet with CGA using Hemiwalker on level surfaces for improved household and short community distances.    Baseline 02/08/2022= 40 feet with use of HW, heavy CGA with max VC for gait sequencing and w/c follow. 5/18 = 85 feet with use of HW, close CGA  7/6: 45 feet, heavy use of hemiwalker 8/3: 85 feet with heavy hemi walker use, no Lob throughout, cues for appropriate weight distribution; 06/28/2022 - 205 feet with use of hemiwalker, CGA; 08/04/2022- Patient presents with inconsistent gait ability - able to walk 150 feet with max effort and increased time using hemiwalker and CGA.    Time 12    Period Weeks    Status On-going    Target Date 10/27/2022        PT LONG TERM GOAL #7   Title Pt will increase 10MWT by at least 0.13 m/s in order to demonstrate clinically significant improvement in community ambulation.    Baseline 02/08/2022= 0.05 m/s using HW 7/6:  .027m/s with  heavy ue use of hemiwalker and CGA for balance 8/3: .084m/s 06/28/2022= 0.1 m/s (will keep goal active to ensure she is consistent); 08/04/2022- 0.09 m/s)   Time 12    Period Weeks    Status Ongoing     Target Date 10/27/2022      8.  Pt will improve 5X STS with full knee extension in standing each rep and with appropriate eccentric control in order to indicate improved transfer efficacy and improved LE strength  Baseline: 52.45 sec with UE support; 06/28/2022= 36.03 sec with UE support (VC for erect standing- patient continues to press up using back of legs against chair to stand); 08/04/2022- Patient required 45 sec using left UE and max VC (flopping at times with sitting)  Goal status: ONGOING TARGET date: 10/27/2022  9.  Patient will increase Berg Balance score by > 6 points to demonstrate decreased fall risk during functional activities Baseline: 19; 06/28/2022= 19/56; 08/04/2022- Deferred secondary to patient too fatigued to attempt after walking today. Will reassess next visit. Goal status: ONGOING Target Date: 10/27/2022           Plan -     Clinical Impression Statement Patient had a good day with w/c mobility and able to incorporate more right LE activity including more quad and hamstring activity more consistently. She was able to walk in // bars with increased speed and confidence vs hemiwalker.  Will continue POC as able to address pt's R sided deficits and optimize gait.   Personal Factors and Comorbidities Age;Comorbidity 1;Comorbidity 2;Comorbidity 3+;Time since onset of injury/illness/exacerbation;Other    Comorbidities AFib, Renal Artery thrombosis, situational depression, HTN, Pyelnephritis, Seizure activity, COnstipation, AKI    Examination-Activity Limitations Bathing;Bed Mobility;Carry;Locomotion Level;Dressing;Sit;Squat;Stairs;Stand;Toileting;Transfers    Examination-Participation Restrictions Church;Community Activity;Laundry    Stability/Clinical Decision  Making Evolving/Moderate complexity    Rehab Potential Fair    PT Frequency 2x / week    PT Duration 12 weeks    PT Treatment/Interventions ADLs/Self Care Home Management;Gait training;Stair training;Therapeutic activities;Therapeutic exercise;Balance training;Neuromuscular re-education;Patient/family education;Wheelchair mobility training;Manual techniques;Energy conservation;Dry needling;Passive range of motion;Taping;Spinal Manipulations;Joint Manipulations    PT Next Visit Plan Gait training, Transfer training, LE strengthening as appropriate.    PT Home Exercise Plan HEP: general LE and postural exercises    Consulted and Agree with Plan of Care Patient            Lenda Kelp PT  Physical Therapist- [facility]  10/11/22, 12:13 PM

## 2022-10-13 ENCOUNTER — Encounter: Payer: Medicare Other | Admitting: Speech Pathology

## 2022-10-13 ENCOUNTER — Encounter: Payer: Medicare Other | Admitting: Occupational Therapy

## 2022-10-13 ENCOUNTER — Encounter: Payer: Self-pay | Admitting: Physical Medicine and Rehabilitation

## 2022-10-13 ENCOUNTER — Encounter
Payer: Medicare Other | Attending: Physical Medicine and Rehabilitation | Admitting: Physical Medicine and Rehabilitation

## 2022-10-13 ENCOUNTER — Ambulatory Visit: Payer: Medicare Other

## 2022-10-13 ENCOUNTER — Ambulatory Visit: Payer: Medicare Other | Admitting: Physical Therapy

## 2022-10-13 VITALS — BP 155/97 | HR 81 | Ht 65.0 in | Wt 160.0 lb

## 2022-10-13 DIAGNOSIS — R252 Cramp and spasm: Secondary | ICD-10-CM | POA: Diagnosis present

## 2022-10-13 DIAGNOSIS — R2681 Unsteadiness on feet: Secondary | ICD-10-CM

## 2022-10-13 DIAGNOSIS — R278 Other lack of coordination: Secondary | ICD-10-CM

## 2022-10-13 DIAGNOSIS — M6281 Muscle weakness (generalized): Secondary | ICD-10-CM

## 2022-10-13 DIAGNOSIS — R269 Unspecified abnormalities of gait and mobility: Secondary | ICD-10-CM

## 2022-10-13 DIAGNOSIS — I69351 Hemiplegia and hemiparesis following cerebral infarction affecting right dominant side: Secondary | ICD-10-CM | POA: Diagnosis not present

## 2022-10-13 DIAGNOSIS — R262 Difficulty in walking, not elsewhere classified: Secondary | ICD-10-CM

## 2022-10-13 DIAGNOSIS — I69398 Other sequelae of cerebral infarction: Secondary | ICD-10-CM | POA: Diagnosis present

## 2022-10-13 DIAGNOSIS — I63512 Cerebral infarction due to unspecified occlusion or stenosis of left middle cerebral artery: Secondary | ICD-10-CM

## 2022-10-13 MED ORDER — BACLOFEN 5 MG PO TABS: 1.0000 | ORAL_TABLET | Freq: Every day | ORAL | 2 refills | Status: AC

## 2022-10-13 MED ORDER — ONABOTULINUMTOXINA 100 UNITS IJ SOLR
300.0000 [IU] | Freq: Once | INTRAMUSCULAR | Status: AC
  Administered 2022-10-13: 300 [IU] via INTRAMUSCULAR

## 2022-10-13 NOTE — Therapy (Signed)
OUTPATIENT PHYSICAL THERAPY TREATMENT NOTE Patient Name: Lindsey [name] MRN: [id number] DOB:04-Jul-1956, 67 y.o., female Today's Date: 10/13/2022  PCP: Dr. Karie Fetch MD REFERRING PROVIDER: Sula Soda MD   PT End of Session - 10/13/22 1119     Visit Number 69    Number of Visits 79    Date for PT Re-Evaluation 10/27/22    Authorization Type Medicare    Authorization Time Period Cert through 02/08/22; Recert 02/08/2022-05/03/2022; Recert 05/05/2022-10/26; Recert 08/04/2022-10/27/2022    Progress Note Due on Visit 70    PT Start Time 1102    PT Stop Time 1143    PT Time Calculation (min) 41 min    Equipment Utilized During Treatment Gait belt    Activity Tolerance Patient tolerated treatment well    Behavior During Therapy WFL for tasks assessed/performed                              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 No pertinent surgical history. 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) 09/24/2021   Cerebral embolism with cerebral infarction 09/23/2021   Pyelonephritis 09/19/2021   Pain due to onychomycosis of toenails of both feet 11/19/2020   Normocytic anemia 05/31/2020   Acute ischemic left MCA stroke (HCC) 04/30/2020   Right hemiplegia (HCC) 04/30/2020    Cerebrovascular accident (HCC) 04/21/2020   Atrial fibrillation with RVR (HCC) 04/21/2020   Essential hypertension 04/21/2020   Alcohol abuse 04/21/2020    REFERRING DIAG: s/p CVA  THERAPY DIAG:  Other lack of coordination  Unsteadiness on feet  Abnormality of gait and mobility  Difficulty in walking, not elsewhere classified  Acute ischemic left MCA stroke (HCC)  Muscle weakness (generalized)  Rationale for Evaluation and Treatment Rehabilitation  PERTINENT HISTORY: Pt was doing very well and was walking short distances wqith a cane prior to second stroke in december. At this point she is limited to wheelchair as her main means of mobility. Pt caregiver reports all detailed information. Pt has hesitation with the left lower extremity and significant weakness on the right lower extremity. Pt will take 2-3 steps when transfering. Pt caregiver assists with all transfers.Pt has shower with ledge to steps over ledge when entering and utilizes bedside commode. Pt has 2 level home with chair lift for upstairs access. Pt would like to improve ransfers, improve neglect on her R side and improve her overall strength and mobility.Pt. is a 67 y.o. 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 Pt. has Right sided weakness, and receptive, and expressive aphasia. Pt. has had a previous CVA with right sided weakness in July 2021. PMHx includes:  AFib, Renal Artery thrombosis, situational depression, HTN, Pyelnephritis, Seizure activity, COnstipation, AKI. Vitamin D deficiency, and urinary incontinence.   PRECAUTIONS: fall risk, wearing RLE AFO  SUBJECTIVE:   Patient caregiver reports they have been busy today and already been to MD. Caregiver states patient having a slow day moving her right LE.     PAIN:  Are you having pain? No      TODAY'S TREATMENT:  10/13/2022  INTERVENTIONS:   Therex:   AAROM Right hip march;  AROM left hip march 2 sets of 12 reps AROM BLE knee ext - 2 sets of 12 reps Ham curl: BTB- 2 sets of 12 reps  Seated resistive Hip abd with BTB x 12 reps x 2 sets Seated hip add squeeze with 5 sec hold Standing hip abd each LE x 10 reps x 2 sets  Standing SLR each LE x 10 reps x 2 sets  Theract:    Patient performed standing with dynamic marching x 10 reps x 2 sets each LE. (VC to weight shift over to right LE)   Stand pivot transfers: Patient able to stand with min Assist yet increased difficulty advancing, pivot, turning right LE which visibly upset patient. Attempted 3x with improved ability to move Right LE with increased time.   PATIENT EDUCATION: Education details: Pt educated throughout session about proper posture and technique with exercises. Improved exercise technique, movement at target joints, use of target muscles after min to mod verbal, visual, tactile cues. Continued education on technique with STS.  Person educated: Patient Education method: Explanation, Demonstration, and Verbal cues Education comprehension: verbalized understanding, returned demonstration, and needs further education   HOME EXERCISE PROGRAM: Continue as previously given;    PT Short Term Goals -       PT SHORT TERM GOAL #1   Title Patient will be independent in home exercise program to improve strength/mobility for better functional independence with ADLs.    Baseline no formal HEP for LE/balance at this time; 02/08/2022=Patient continues to require VC for reminders and assist with hip march/knee ext (seated for strengthening); Reminders to continue to work on static stand at home.    Time 4    Period Weeks    Status Partially Met  04/07/22: complete somewhat regularly     Target Date 12/14/21              PT Long Term Goals -       PT LONG TERM GOAL #1   Title Patient will increase FOTO score to equal to or greater than   41  to demonstrate statistically significant improvement in  mobility and quality of life.    Baseline 16 on 2/14; 02/08/2022=Patient unable to complete today- No family present but will attempt again next visit available with family present to assist in completion; 02/17/2022= 40; 06/28/2022- No caregiver present in PT session to assist in answering questions. 08/04/2022= 40    Time 12    Period Weeks    Status On-going    Target Date 10/27/2022       PT LONG TERM GOAL #3   Title Patient  will complete five times sit to stand test in < 30 seconds with UE assistance indicating an increased LE strength and improved balance.    Baseline 58.47 s with UE assist and with A from PT for R LE positioning; 02/08/2022=54 sec with use of L UE support and Min physical assist to position right LE.  7/6: 30.5 sec, min A  for LE positioning, some attempts did not come to full erect posture, did not let go with L UE from armchair with any attempt 05/05/22: 26.65 sec   Time 12    Period Weeks    Status Met    Target Date 05/03/22      PT LONG TERM GOAL #4   Title Patient will increase Berg Balance score by > 6 points to demonstrate decreased fall risk during functional activities.    Baseline 12 on 2/14; 02/08/2022= 13/56  04/07/22: 17  8/3: 19    Time 12    Period Weeks    Status Met    Target Date 05/03/22      PT LONG TERM GOAL #5   Title Pt will transfer from seated position on table/chair to/from transport chair/WC in order to indicate improved transfer ability.    Baseline Requires Min A for LE positioning and to prevent LOB. 02/08/2022=Patient attempted to perform on her own- Unable to push herself up from chair- Very min assist to position right LE into more flexed position and VC for hand placement. Patient able to stand with min Assist with use of gait belt and VC to lean; 02/17/2022=Patient was able to transfer from transport w/c with some physical assist with Right to swing to pivot to side of chair. She was able to sit to stand today with only CGA and then stand pivot  with right arm holding onto arm of chair. Only difficulty was stand to sit- Max VC not to plop into chair. 04/07/22: Pt performed movement of B LE to side of chair independently with chair transfer. Pt required min A with R LE foot placement of floor. Pt requires increased time to complete but complete with CGA only for safety and no cues or A to prevent falling/ plopping into armchair.  8/3: Pt requires min A for maintenance of standig balance, able to navigate from transport chairr to arm chair with hemiwalker, does not controll descent with ahands still " flops" into chair; 08/04/2022- Patient continues to be inconsistent in her transfers- at times only CGA and other more min Assist and repetitive VC. Unclear due to some ongoing cognitive issues if patient will achieve this goal but still presents with potential. 09/08/2022- Patient able to stand with CGA yet continues to require initial VC for proper sequencing.   Time 12    Period Weeks    Status Partially met    Target Date 10/27/2022       PT LONG TERM GOAL #6   Title Patient will ambulate > 150 feet with CGA using Hemiwalker on level surfaces for improved household and short community distances.    Baseline 02/08/2022= 40 feet with use of HW, heavy CGA with max VC for gait sequencing and w/c follow. 5/18 = 85 feet with use of HW, close CGA  7/6: 45 feet, heavy use of hemiwalker 8/3: 85 feet with heavy hemi walker use, no Lob throughout, cues for appropriate weight distribution; 06/28/2022 - 205 feet with use of hemiwalker, CGA; 08/04/2022- Patient presents with inconsistent gait ability - able to walk 150 feet with max effort and increased time using hemiwalker and CGA.    Time 12    Period Weeks    Status On-going    Target Date 10/27/2022        PT LONG TERM GOAL #7   Title Pt will increase by at least 0.13 m/s in order to demonstrate clinically significant improvement in community ambulation.  Baseline 02/08/2022= 0.05 m/s using HW  7/6: .069m/s with heavy ue use of hemiwalker and CGA for balance 8/3: .031m/s 06/28/2022= 0.1 m/s (will keep goal active to ensure she is consistent); 08/04/2022- 0.09 m/s)   Time 12    Period Weeks    Status Ongoing     Target Date 10/27/2022      8.  Pt will improve 5X STS with full knee extension in standing each rep and with appropriate eccentric control in order to indicate improved transfer efficacy and improved LE strength  Baseline: 52.45 sec with UE support; 06/28/2022= 36.03 sec with UE support (VC for erect standing- patient continues to press up using back of legs against chair to stand); 08/04/2022- Patient required 45 sec using left UE and max VC (flopping at times with sitting)  Goal status: ONGOING TARGET date: 10/27/2022  9.  Patient will increase Berg Balance score by > 6 points to demonstrate decreased fall risk during functional activities Baseline: 19; 06/28/2022= 19/56; 08/04/2022- Deferred secondary to patient too fatigued to attempt after walking today. Will reassess next visit. Goal status: ONGOING Target Date: 10/27/2022           Plan -     Clinical Impression Statement Patient presented with good motivation yet overall- had initial difficulty moving right LE today. She did improve some during visit but remained more weak overall with increased difficulty with transfers overall despite constant verbal cues and practice. Patient will continue to benefit from skilled PT services to assist with promoting increased LE strength, functional mobility, and more independent function.     Personal Factors and Comorbidities Age;Comorbidity 1;Comorbidity 2;Comorbidity 3+;Time since onset of injury/illness/exacerbation;Other    Comorbidities AFib, Renal Artery thrombosis, situational depression, HTN, Pyelnephritis, Seizure activity, COnstipation, AKI    Examination-Activity Limitations Bathing;Bed Mobility;Carry;Locomotion Level;Dressing;Sit;Squat;Stairs;Stand;Toileting;Transfers     Examination-Participation Restrictions Church;Community Activity;Laundry    Stability/Clinical Decision Making Evolving/Moderate complexity    Rehab Potential Fair    PT Frequency 2x / week    PT Duration 12 weeks    PT Treatment/Interventions ADLs/Self Care Home Management;Gait training;Stair training;Therapeutic activities;Therapeutic exercise;Balance training;Neuromuscular re-education;Patient/family education;Wheelchair mobility training;Manual techniques;Energy conservation;Dry needling;Passive range of motion;Taping;Spinal Manipulations;Joint Manipulations    PT Next Visit Plan Gait training, Transfer training, LE strengthening as appropriate.    PT Home Exercise Plan HEP: general LE and postural exercises    Consulted and Agree with Plan of Care Patient            Lewis Moccasin PT  Physical Therapist- [facility]  10/13/22, 9:29 PM

## 2022-10-14 NOTE — Progress Notes (Signed)
Botox Injection for spasticity, palpation and activation approach used for localization since ultrasound machine was not working   Dilution: 1:1 Indication: Severe spasticity which interferes with ADL,mobility and/or  hygiene and is unresponsive to medication management and other conservative care   Informed consent was obtained after describing risks and benefits of the procedure with the patient. This includes bleeding, bruising, infection, excessive weakness, or medication side effects. A REMS form is on file and signed.  1) Pectoralis: 50U 2) Flexor digitorum profundus: 50U in 1 site 3) Flexor carpi radialis: 50U U in 1 site 4) Flexor digitorum superficialis: 50U in 1 site 5) Pronator quadratus: 50U 6) Pronator teres: 50U     All injections were done after palpation and patient activation of the muscle confirmed the accurate location, and after negative drawback for blood. The patient tolerated the procedure well. Post procedure instructions were given. A followup appointment was made.

## 2022-10-18 ENCOUNTER — Encounter: Payer: Medicare Other | Admitting: Occupational Therapy

## 2022-10-18 ENCOUNTER — Encounter: Payer: Medicare Other | Admitting: Speech Pathology

## 2022-10-18 ENCOUNTER — Ambulatory Visit: Payer: Medicare Other | Admitting: Physical Therapy

## 2022-10-18 ENCOUNTER — Ambulatory Visit: Payer: Medicare Other

## 2022-10-18 ENCOUNTER — Ambulatory Visit: Payer: Medicare Other | Admitting: Occupational Therapy

## 2022-10-18 DIAGNOSIS — I63512 Cerebral infarction due to unspecified occlusion or stenosis of left middle cerebral artery: Secondary | ICD-10-CM

## 2022-10-18 DIAGNOSIS — R269 Unspecified abnormalities of gait and mobility: Secondary | ICD-10-CM

## 2022-10-18 DIAGNOSIS — R2681 Unsteadiness on feet: Secondary | ICD-10-CM

## 2022-10-18 DIAGNOSIS — R278 Other lack of coordination: Secondary | ICD-10-CM

## 2022-10-18 DIAGNOSIS — I63542 Cerebral infarction due to unspecified occlusion or stenosis of left cerebellar artery: Secondary | ICD-10-CM

## 2022-10-18 DIAGNOSIS — R2689 Other abnormalities of gait and mobility: Secondary | ICD-10-CM

## 2022-10-18 DIAGNOSIS — M6281 Muscle weakness (generalized): Secondary | ICD-10-CM

## 2022-10-18 DIAGNOSIS — R262 Difficulty in walking, not elsewhere classified: Secondary | ICD-10-CM

## 2022-10-18 NOTE — Therapy (Signed)
OUTPATIENT PHYSICAL THERAPY TREATMENT NOTE/Physical Therapy Progress Note   Dates of reporting period  09/08/2022   to   10/18/2022 Patient Name: Lindsey [name] MRN: [id number] DOB:17-Sep-1956, 67 y.o., female Today's Date: 10/19/2022  PCP: Dr. Tomasa Hose MD REFERRING PROVIDER: Leeroy Cha MD   PT End of Session - 10/18/22 1109     Visit Number 70    Number of Visits 37    Date for PT Re-Evaluation 10/27/22    Authorization Type Medicare    Authorization Time Period Cert through 1/0/25; Recert 05/07/2777-11/07/2351; Recert 03/03/4430-54/00; Recert 86/04/6194-0/93/2671    Progress Note Due on Visit 15    PT Start Time 1103    PT Stop Time 1142    PT Time Calculation (min) 39 min    Equipment Utilized During Treatment Gait belt    Activity Tolerance Patient tolerated treatment well    Behavior During Therapy WFL for tasks assessed/performed                              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 No pertinent surgical history. 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 ([place]) 09/24/2021   Cerebral embolism with cerebral infarction 09/23/2021   Pyelonephritis 09/19/2021   Pain due to onychomycosis of toenails of both feet 11/19/2020   Normocytic anemia 05/31/2020    Acute ischemic left MCA stroke ([place]) 04/30/2020   Right hemiplegia ([place]) 04/30/2020   Cerebrovascular accident ([place]) 04/21/2020   Atrial fibrillation with RVR ([place]) 04/21/2020   Essential hypertension 04/21/2020   Alcohol abuse 04/21/2020    REFERRING DIAG: s/p CVA  THERAPY DIAG:  Other lack of coordination  Unsteadiness on feet  Abnormality of gait and mobility  Difficulty in walking, not elsewhere classified  Acute ischemic left MCA stroke (HCC)  Muscle weakness (generalized)  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 At this point she is limited to wheelchair as her main means of mobility. Pt caregiver reports all detailed information. Pt has hesitation with the left lower extremity and significant weakness on the right lower extremity. Pt will take 2-3 steps when transfering. Pt caregiver assists with all transfers.Pt has shower with ledge to steps over ledge when entering and utilizes bedside commode. Pt has 2 level home with chair lift for upstairs access. Pt would like to improve ransfers, improve neglect on her R side and improve her overall strength and mobility.Pt. is a 67 y.o. female who was diagnosed with  a Cerebral Infarction secondary to stenosis of the left cerebellar artery on 09/19/2021. Pt. has Right sided weakness, and receptive, and expressive aphasia. Pt. has had a previous CVA with right sided weakness in July 2021. PMHx includes: AFib, Renal Artery thrombosis, situational depression, HTN, Pyelnephritis, Seizure activity, COnstipation, AKI. Vitamin D deficiency, and urinary incontinence.   PRECAUTIONS: fall risk, wearing RLE AFO  SUBJECTIVE:   Patient reports no new issues. States feeling okay  without any pain.   PAIN:  Are you having pain? No      TODAY'S TREATMENT:  10/13/2022  [facility] PT Assessment - 10/18/22 1125       Berg Balance Test   Sit to Stand Needs minimal aid to stand or to stabilize    Standing Unsupported Able to stand 2 minutes with supervision    Sitting with Back Unsupported but Feet Supported on Floor or Stool Able to sit safely and securely 2 minutes    Stand to Sit Uses backs of legs against chair to control descent    Transfers Needs one person to assist    Standing Unsupported with Eyes Closed Able to stand 10 seconds with supervision    Standing Unsupported with Feet Together Able to place feet together independently and stand for 1 minute with supervision    From Standing, Reach Forward with Outstretched Arm Reaches forward but needs supervision    From Standing Position, Pick up Object from Floor Unable to try/needs assist to keep balance    From Standing Position, Turn to Look Behind Over each Shoulder Needs supervision when turning    Turn 360 Degrees Needs assistance while turning    Standing Unsupported, Alternately Place Feet on Step/Stool Able to complete >2 steps/needs minimal assist    Standing Unsupported, One Foot in Front Needs help to step but can hold 15 seconds    Standing on One Leg Unable to try or needs assist to prevent fall    Total Score 21            INTERVENTIONS:   Therex:   AAROM Right hip march; AROM left hip march 2 sets of 12 reps AROM BLE knee ext - 2 sets of 12 reps Standing hip abd each LE x 10 reps x 2 sets  Standing SLR each LE x 10 reps x 2 sets Sit to stand x 10 with 1 UE support and min assist   Theract:   Reassessed some goals today for progress note including BERG, 5 x STS, gait distance- See goal section for today's details.      PATIENT EDUCATION: Education details: Pt educated throughout session about proper posture and technique with exercises. Improved exercise technique, movement at  target joints, use of target muscles after min to mod verbal, visual, tactile cues. Continued education on technique with STS.  Person educated: Patient Education method: Explanation, Demonstration, and Verbal cues Education comprehension: verbalized understanding, returned demonstration, and needs further education   HOME EXERCISE PROGRAM: Continue as previously given;    PT Short Term Goals -       PT SHORT TERM GOAL #1   Title Patient will be independent in home exercise program to improve strength/mobility for better functional independence with ADLs.    Baseline no formal HEP for LE/balance at this time; 02/08/2022=Patient continues to require VC for reminders and assist with hip march/knee ext (seated for strengthening); Reminders to continue to work on static stand at home.    Time 4    Period Weeks  Status Partially Met  04/07/22: complete somewhat regularly     Target Date 12/14/21              PT Long Term Goals -       PT LONG TERM GOAL #1   Title Patient will increase FOTO score to equal to or greater than   41  to demonstrate statistically significant improvement in mobility and quality of life.    Baseline 16 on 2/14; 02/08/2022=Patient unable to complete today- No family present but will attempt again next visit available with family present to assist in completion; 02/17/2022= 40; 06/28/2022- No caregiver present in PT session to assist in answering questions. 08/04/2022= 40    Time 12    Period Weeks    Status On-going    Target Date 10/27/2022       PT LONG TERM GOAL #3   Title Patient  will complete five times sit to stand test in < 30 seconds with UE assistance indicating an increased LE strength and improved balance.    Baseline 58.47 s with UE assist and with A from PT for R LE positioning; 02/08/2022=54 sec with use of L UE support and Min physical assist to position right LE.  7/6: 30.5 sec, min A for LE positioning, some attempts did not come to full erect  posture, did not let go with L UE from armchair with any attempt 05/05/22: 26.65 sec   Time 12    Period Weeks    Status Met    Target Date 05/03/22      PT LONG TERM GOAL #4   Title Patient will increase Berg Balance score by > 6 points to demonstrate decreased fall risk during functional activities.    Baseline 12 on 2/14; 02/08/2022= 13/56  04/07/22: 17  8/3: 19    Time 12    Period Weeks    Status Met    Target Date 05/03/22      PT LONG TERM GOAL #5   Title Pt will transfer from seated position on table/chair to/from transport chair/WC in order to indicate improved transfer ability.    Baseline Requires Min A for LE positioning and to prevent LOB. 02/08/2022=Patient attempted to perform on her own- Unable to push herself up from chair- Very min assist to position right LE into more flexed position and VC for hand placement. Patient able to stand with min Assist with use of gait belt and VC to lean; 02/17/2022=Patient was able to transfer from transport w/c with some physical assist with Right to swing to pivot to side of chair. She was able to sit to stand today with only CGA and then stand pivot with right arm holding onto arm of chair. Only difficulty was stand to sit- Max VC not to plop into chair. 04/07/22: Pt performed movement of B LE to side of chair independently with chair transfer. Pt required min A with R LE foot placement of floor. Pt requires increased time to complete but complete with CGA only for safety and no cues or A to prevent falling/ plopping into armchair.  8/3: Pt requires min A for maintenance of standig balance, able to navigate from transport chairr to arm chair with hemiwalker, does not controll descent with ahands still " flops" into chair; 08/04/2022- Patient continues to be inconsistent in her transfers- at times only CGA and other more min Assist and repetitive VC. Unclear due to some ongoing cognitive issues if patient will achieve this goal but  still presents with  potential. 09/08/2022- Patient able to stand with CGA yet continues to require initial VC for proper sequencing.   Time 12    Period Weeks    Status Partially met    Target Date 10/27/2022       PT LONG TERM GOAL #6   Title Patient will ambulate > 150 feet with CGA using Hemiwalker on level surfaces for improved household and short community distances.    Baseline 02/08/2022= 40 feet with use of HW, heavy CGA with max VC for gait sequencing and w/c follow. 5/18 = 85 feet with use of HW, close CGA  7/6: 45 feet, heavy use of hemiwalker 8/3: 85 feet with heavy hemi walker use, no Lob throughout, cues for appropriate weight distribution; 06/28/2022 - 205 feet with use of hemiwalker, CGA; 08/04/2022- Patient presents with inconsistent gait ability - able to walk 150 feet with max effort and increased time using hemiwalker and CGA. 10/18/2022= Patient ambulated around 45 feet at end of session and complained of being too fatigued to continue- very slow deliberate steps- difficulty advancing right LE   Time 12    Period Weeks    Status On-going    Target Date 10/27/2022        PT LONG TERM GOAL #7   Title Pt will increase 10MWT by at least 0.13 m/s in order to demonstrate clinically significant improvement in community ambulation.    Baseline 02/08/2022= 0.05 m/s using HW 7/6: .055m/s with heavy ue use of hemiwalker and CGA for balance 8/3: .037m/s 06/28/2022= 0.1 m/s (will keep goal active to ensure she is consistent); 08/04/2022- 0.09 m/s); 10/18/2021= 0.07 m/s using hemiwalker   Time 12    Period Weeks    Status Ongoing     Target Date 10/27/2022      8.  Pt will improve 5X STS with full knee extension in standing each rep and with appropriate eccentric control in order to indicate improved transfer efficacy and improved LE strength  Baseline: 52.45 sec with UE support; 06/28/2022= 36.03 sec with UE support (VC for erect standing- patient continues to press up using back of legs against chair to stand);  08/04/2022- Patient required 45 sec using left UE and max VC (flopping at times with sitting); 10/18/2022 = 48 sec with Left UE and continued VC to lean forward and stand erect without falling back into chair- some improved eccentric control with last 2 reps. Goal status: ONGOING TARGET date: 10/27/2022  9.  Patient will increase Berg Balance score by > 6 points to demonstrate decreased fall risk during functional activities Baseline: 19; 06/28/2022= 19/56; 08/04/2022- Deferred secondary to patient too fatigued to attempt after walking today. Will reassess next visit. 10/18/2022=21  Goal status: ONGOING Target Date: 10/27/2022           Plan -     Clinical Impression Statement Patient presents with mixed results during today's progress note visit. She was able to demo some improvement with BERG balance although still very low score indicative of high risk of falling. She also presents with ability to walk using hemiwalker- close CGA and w/c follow but continues to struggle at times to advance right LE and fatigue is still a major issue. Encouraged patient to perform more standing and walking in home setting. Patient's condition has the potential to improve in response to therapy. Maximum improvement is yet to be obtained. The anticipated improvement is attainable and reasonable in a generally predictable time.  Patient will  continue to benefit from skilled PT services to assist with promoting increased LE strength, functional mobility, and more independent function.     Personal Factors and Comorbidities Age;Comorbidity 1;Comorbidity 2;Comorbidity 3+;Time since onset of injury/illness/exacerbation;Other    Comorbidities AFib, Renal Artery thrombosis, situational depression, HTN, Pyelnephritis, Seizure activity, COnstipation, AKI    Examination-Activity Limitations Bathing;Bed Mobility;Carry;Locomotion Level;Dressing;Sit;Squat;Stairs;Stand;Toileting;Transfers    Examination-Participation Restrictions  Church;Community Activity;Laundry    Stability/Clinical Decision Making Evolving/Moderate complexity    Rehab Potential Fair    PT Frequency 2x / week    PT Duration 12 weeks    PT Treatment/Interventions ADLs/Self Care Home Management;Gait training;Stair training;Therapeutic activities;Therapeutic exercise;Balance training;Neuromuscular re-education;Patient/family education;Wheelchair mobility training;Manual techniques;Energy conservation;Dry needling;Passive range of motion;Taping;Spinal Manipulations;Joint Manipulations    PT Next Visit Plan Gait training, Transfer training, LE strengthening as appropriate.    PT Home Exercise Plan HEP: general LE and postural exercises    Consulted and Agree with Plan of Care Patient            Lenda Kelp PT  Physical Therapist- [facility]  [facility]  10/19/22, 7:32 AM

## 2022-10-20 ENCOUNTER — Ambulatory Visit: Payer: Medicare Other | Admitting: Physical Therapy

## 2022-10-20 ENCOUNTER — Encounter: Payer: Medicare Other | Admitting: Occupational Therapy

## 2022-10-20 ENCOUNTER — Encounter: Payer: Medicare Other | Admitting: Speech Pathology

## 2022-10-20 ENCOUNTER — Ambulatory Visit: Payer: Medicare Other

## 2022-10-25 ENCOUNTER — Ambulatory Visit: Payer: Medicare Other | Admitting: Occupational Therapy

## 2022-10-25 ENCOUNTER — Ambulatory Visit: Payer: Medicare Other | Admitting: Physical Therapy

## 2022-10-25 ENCOUNTER — Encounter: Payer: Medicare Other | Admitting: Speech Pathology

## 2022-10-25 ENCOUNTER — Ambulatory Visit: Payer: Medicare Other

## 2022-10-25 ENCOUNTER — Encounter: Payer: Medicare Other | Admitting: Occupational Therapy

## 2022-10-25 DIAGNOSIS — R278 Other lack of coordination: Secondary | ICD-10-CM

## 2022-10-25 DIAGNOSIS — I63512 Cerebral infarction due to unspecified occlusion or stenosis of left middle cerebral artery: Secondary | ICD-10-CM

## 2022-10-25 DIAGNOSIS — R262 Difficulty in walking, not elsewhere classified: Secondary | ICD-10-CM

## 2022-10-25 DIAGNOSIS — R269 Unspecified abnormalities of gait and mobility: Secondary | ICD-10-CM

## 2022-10-25 DIAGNOSIS — M6281 Muscle weakness (generalized): Secondary | ICD-10-CM

## 2022-10-25 DIAGNOSIS — R2681 Unsteadiness on feet: Secondary | ICD-10-CM

## 2022-10-25 NOTE — Therapy (Signed)
OUTPATIENT PHYSICAL THERAPY TREATMENT NOTE  Patient Name: Lindsey [name] MRN: [id number] DOB:[birth year], 67 y.o., female Today's Date: 10/25/2022  PCP: Dr. Tomasa Hose MD REFERRING PROVIDER: Leeroy Cha MD   PT End of Session - 10/25/22 1113     Visit Number 63    Number of Visits 13    Date for PT Re-Evaluation 10/27/22    Authorization Type Medicare    Authorization Time Period Cert through 01/09/69; Recert 11/09/3783-05/10/5026; Recert 04/05/1286-86/76; Recert 72/0/9470-9/62/8366    Progress Note Due on Visit 46    PT Start Time 1112    PT Stop Time 1145    PT Time Calculation (min) 33 min    Equipment Utilized During Treatment Gait belt    Activity Tolerance Patient tolerated treatment well    Behavior During Therapy WFL for tasks assessed/performed                              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 No pertinent surgical history. 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 ([place]) 09/24/2021   Cerebral embolism with cerebral infarction 09/23/2021   Pyelonephritis 09/19/2021   Pain due to onychomycosis of toenails of both feet 11/19/2020   Normocytic anemia 05/31/2020   Acute ischemic left MCA stroke ([place]) 04/30/2020   Right hemiplegia ([place]) 04/30/2020    Cerebrovascular accident ([place]) 04/21/2020   Atrial fibrillation with RVR ([place]) 04/21/2020   Essential hypertension 04/21/2020   Alcohol abuse 04/21/2020    REFERRING DIAG: s/p CVA  THERAPY DIAG:  Other lack of coordination  Unsteadiness on feet  Abnormality of gait and mobility  Difficulty in walking, not elsewhere classified  Acute ischemic left MCA stroke (HCC)  Muscle weakness (generalized)  Rationale for Evaluation and Treatment Rehabilitation  PERTINENT HISTORY: Pt was doing very well and was walking short distances wqith a cane prior to second stroke in december. At this point she is limited to wheelchair as her main means of mobility. Pt caregiver reports all detailed information. Pt has hesitation with the left lower extremity and significant weakness on the right lower extremity. Pt will take 2-3 steps when transfering. Pt caregiver assists with all transfers.Pt has shower with ledge to steps over ledge when entering and utilizes bedside commode. Pt has 2 level home with chair lift for upstairs access. Pt would like to improve ransfers, improve neglect on her R side and improve her overall strength and mobility.Pt. is a 67 y.o. 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 Pt. has Right sided weakness, and receptive, and expressive aphasia. Pt. has had a previous CVA with right sided weakness in July 2021. PMHx  includes: AFib, Renal Artery thrombosis, situational depression, HTN, Pyelnephritis, Seizure activity, COnstipation, AKI. Vitamin D deficiency, and urinary incontinence.   PRECAUTIONS: fall risk, wearing RLE AFO  SUBJECTIVE:   Patient reports no falls. No pain. Daughter reports pt's transfers are going fine at home once her RLE warms up. RLE does not move as well with this cold weather. Arriving late to session.  PAIN:  Are you having pain? No      TODAY'S TREATMENT:   10/25/2022   INTERVENTIONS:   Therex:  AAROM Right hip march; AROM left hip march 2 sets of 12 reps  AROM BLE knee ext - 1x12 reps  STS x5 with LUE support on // bar to assist. Very limited eccentric control with descent needing minA from PT.   Neuro Re-Ed:   Pt transferred stand pivot from transport chair to standard chair dependent for RLE positioning, min to modA at hips for set up prior to transfer. Pt able to assist with L side of body. modA with transfer at torso and HHA for safe transfer to chair.     Gait in // bars x4 laps with seated rest b/t. minA needed with each STS before gait trials. Use of mirror as visual feedback for upright posture, L lateral weight shift to assist in RLE in swing phase. Pt also reliant on min to mod multimodal cues and minA on RLE for posture, L weight shift in stance phase and minA on RLE. Towards 3rd and 4th lap pt able to progress RLE with multimodal cuing. As pt fatigues she presents with anterior trunk lean and crouched posture needing mod to max VC's with fair carryover to maintain.   PATIENT EDUCATION: Education details: Pt educated throughout session about proper posture and technique with exercises. Improved exercise technique, movement at target joints, use of target muscles after min to mod verbal, visual, tactile cues. Continued education on technique with STS.  Person educated: Patient Education method: Explanation, Demonstration, and Verbal cues Education comprehension: verbalized understanding, returned demonstration, and needs further education   HOME EXERCISE PROGRAM: Continue as previously given;    PT Short Term Goals -       PT SHORT TERM GOAL #1   Title Patient will be independent in home exercise program to improve strength/mobility for better functional independence with ADLs.    Baseline no formal HEP for LE/balance at this time; 02/08/2022=Patient continues to require VC for reminders and assist with hip march/knee ext  (seated for strengthening); Reminders to continue to work on static stand at home.    Time 4    Period Weeks    Status Partially Met  04/07/22: complete somewhat regularly     Target Date 12/14/21              PT Long Term Goals -       PT LONG TERM GOAL #1   Title Patient will increase FOTO score to equal to or greater than   41  to demonstrate statistically significant improvement in mobility and quality of life.    Baseline 16 on 2/14; 02/08/2022=Patient unable to complete today- No family present but will attempt again next visit available with family present to assist in completion; 02/17/2022= 40; 06/28/2022- No caregiver present in PT session to assist in answering questions. 08/04/2022= 40    Time 12    Period Weeks    Status On-going    Target Date 10/27/2022       PT LONG TERM GOAL #3  Title Patient  will complete five times sit to stand test in < 30 seconds with UE assistance indicating an increased LE strength and improved balance.    Baseline 58.47 s with UE assist and with A from PT for R LE positioning; 02/08/2022=54 sec with use of L UE support and Min physical assist to position right LE.  7/6: 30.5 sec, min A for LE positioning, some attempts did not come to full erect posture, did not let go with L UE from armchair with any attempt 05/05/22: 26.65 sec   Time 12    Period Weeks    Status Met    Target Date 05/03/22      PT LONG TERM GOAL #4   Title Patient will increase Berg Balance score by > 6 points to demonstrate decreased fall risk during functional activities.    Baseline 12 on 2/14; 02/08/2022= 13/56  04/07/22: 17  8/3: 19    Time 12    Period Weeks    Status Met    Target Date 05/03/22      PT LONG TERM GOAL #5   Title Pt will transfer from seated position on table/chair to/from transport chair/WC in order to indicate improved transfer ability.    Baseline Requires Min A for LE positioning and to prevent LOB. 02/08/2022=Patient attempted to perform on her  own- Unable to push herself up from chair- Very min assist to position right LE into more flexed position and VC for hand placement. Patient able to stand with min Assist with use of gait belt and VC to lean; 02/17/2022=Patient was able to transfer from transport w/c with some physical assist with Right to swing to pivot to side of chair. She was able to sit to stand today with only CGA and then stand pivot with right arm holding onto arm of chair. Only difficulty was stand to sit- Max VC not to plop into chair. 04/07/22: Pt performed movement of B LE to side of chair independently with chair transfer. Pt required min A with R LE foot placement of floor. Pt requires increased time to complete but complete with CGA only for safety and no cues or A to prevent falling/ plopping into armchair.  8/3: Pt requires min A for maintenance of standig balance, able to navigate from transport chairr to arm chair with hemiwalker, does not controll descent with ahands still " flops" into chair; 08/04/2022- Patient continues to be inconsistent in her transfers- at times only CGA and other more min Assist and repetitive VC. Unclear due to some ongoing cognitive issues if patient will achieve this goal but still presents with potential. 09/08/2022- Patient able to stand with CGA yet continues to require initial VC for proper sequencing.   Time 12    Period Weeks    Status Partially met    Target Date 10/27/2022       PT LONG TERM GOAL #6   Title Patient will ambulate > 150 feet with CGA using Hemiwalker on level surfaces for improved household and short community distances.    Baseline 02/08/2022= 40 feet with use of HW, heavy CGA with max VC for gait sequencing and w/c follow. 5/18 = 85 feet with use of HW, close CGA  7/6: 45 feet, heavy use of hemiwalker 8/3: 85 feet with heavy hemi walker use, no Lob throughout, cues for appropriate weight distribution; 06/28/2022 - 205 feet with use of hemiwalker, CGA; 08/04/2022- Patient  presents with inconsistent gait ability - able to walk  150 feet with max effort and increased time using hemiwalker and CGA. 10/18/2022= Patient ambulated around 45 feet at end of session and complained of being too fatigued to continue- very slow deliberate steps- difficulty advancing right LE   Time 12    Period Weeks    Status On-going    Target Date 10/27/2022        PT LONG TERM GOAL #7   Title Pt will increase by at least 0.13 m/s in order to demonstrate clinically significant improvement in community ambulation.    Baseline 02/08/2022= 0.05 m/s using HW 7/6: .071m/s with heavy ue use of hemiwalker and CGA for balance 8/3: .033m/s 06/28/2022= 0.1 m/s (will keep goal active to ensure she is consistent); 08/04/2022- 0.09 m/s); 10/18/2021= 0.07 m/s using hemiwalker   Time 12    Period Weeks    Status Ongoing     Target Date 10/27/2022      8.  Pt will improve 5X STS with full knee extension in standing each rep and with appropriate eccentric control in order to indicate improved transfer efficacy and improved LE strength  Baseline: 52.45 sec with UE support; 06/28/2022= 36.03 sec with UE support (VC for erect standing- patient continues to press up using back of legs against chair to stand); 08/04/2022- Patient required 45 sec using left UE and max VC (flopping at times with sitting); 10/18/2022 = 48 sec with Left UE and continued VC to lean forward and stand erect without falling back into chair- some improved eccentric control with last 2 reps. Goal status: ONGOING TARGET date: 10/27/2022  9.  Patient will increase Berg Balance score by > 6 points to demonstrate decreased fall risk during functional activities Baseline: 19; 06/28/2022= 19/56; 08/04/2022- Deferred secondary to patient too fatigued to attempt after walking today. Will reassess next visit. 10/18/2022=21  Goal status: ONGOING Target Date: 10/27/2022           Plan -     Clinical Impression Statement Pt presents in good  spirits and highly motivated. She was able to perform multiple gait bouts in // bars using mirror feed back and multimodal cuing with gait training needing frequent cuing and assist as listed above for progressing RLE with pt showing in session improvement in completing task without needing physical assist from PT but still needing multimodal cues. Session limited as pt late to session but does demonstrate improved tolerance for transfers and gait compared to prior sessions. Pt remains a high falls risk needing hands on skilled care for safe gait progression. Patient will continue to benefit from skilled PT services to assist with promoting increased LE strength, functional mobility, and more independent function.      Personal Factors and Comorbidities Age;Comorbidity 1;Comorbidity 2;Comorbidity 3+;Time since onset of injury/illness/exacerbation;Other    Comorbidities AFib, Renal Artery thrombosis, situational depression, HTN, Pyelnephritis, Seizure activity, COnstipation, AKI    Examination-Activity Limitations Bathing;Bed Mobility;Carry;Locomotion Level;Dressing;Sit;Squat;Stairs;Stand;Toileting;Transfers    Examination-Participation Restrictions Church;Community Activity;Laundry    Stability/Clinical Decision Making Evolving/Moderate complexity    Rehab Potential Fair    PT Frequency 2x / week    PT Duration 12 weeks    PT Treatment/Interventions ADLs/Self Care Home Management;Gait training;Stair training;Therapeutic activities;Therapeutic exercise;Balance training;Neuromuscular re-education;Patient/family education;Wheelchair mobility training;Manual techniques;Energy conservation;Dry needling;Passive range of motion;Taping;Spinal Manipulations;Joint Manipulations    PT Next Visit Plan Gait training, Transfer training, LE strengthening as appropriate.    PT Home Exercise Plan HEP: general LE and postural exercises    Consulted and Agree with Plan of Care  Patient            Salem Caster. Fairly  IV, PT, DPT Physical Therapist- [facility]  10/25/22, 1:52 PM

## 2022-10-27 ENCOUNTER — Encounter: Payer: Medicare Other | Admitting: Occupational Therapy

## 2022-10-27 ENCOUNTER — Encounter: Payer: Medicare Other | Admitting: Speech Pathology

## 2022-10-27 ENCOUNTER — Ambulatory Visit: Payer: Medicare Other

## 2022-10-27 ENCOUNTER — Ambulatory Visit: Payer: Medicare Other | Admitting: Physical Therapy

## 2022-10-27 DIAGNOSIS — R2681 Unsteadiness on feet: Secondary | ICD-10-CM

## 2022-10-27 DIAGNOSIS — M6281 Muscle weakness (generalized): Secondary | ICD-10-CM

## 2022-10-27 DIAGNOSIS — R269 Unspecified abnormalities of gait and mobility: Secondary | ICD-10-CM

## 2022-10-27 DIAGNOSIS — R278 Other lack of coordination: Secondary | ICD-10-CM

## 2022-10-27 DIAGNOSIS — R262 Difficulty in walking, not elsewhere classified: Secondary | ICD-10-CM

## 2022-10-27 DIAGNOSIS — I63512 Cerebral infarction due to unspecified occlusion or stenosis of left middle cerebral artery: Secondary | ICD-10-CM

## 2022-10-27 NOTE — Therapy (Signed)
OUTPATIENT PHYSICAL THERAPY TREATMENT NOTE  Patient Name: Lindsey [name] MRN: [id number] DOB:04/24/1956, 67 y.o., female Today's Date: 10/27/2022  PCP: Dr. Tomasa Hose MD REFERRING PROVIDER: Leeroy Cha MD   PT End of Session - 10/27/22 1058     Visit Number 72    Number of Visits 28    Date for PT Re-Evaluation 10/27/22    Authorization Type Medicare    Authorization Time Period Cert through 01/07/81; Recert 06/07/6212-0/05/6577; Recert 01/07/9628-52/84; Recert 13/11/4399-0/27/2536    Progress Note Due on Visit 26    PT Start Time 1056    PT Stop Time 1140    PT Time Calculation (min) 44 min    Equipment Utilized During Treatment Gait belt    Activity Tolerance Patient tolerated treatment well    Behavior During Therapy WFL for tasks assessed/performed                              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 No pertinent surgical history. 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 ([place]) 09/24/2021   Cerebral embolism with cerebral infarction 09/23/2021   Pyelonephritis 09/19/2021   Pain due to onychomycosis of toenails of both feet 11/19/2020   Normocytic anemia 05/31/2020   Acute ischemic left MCA stroke ([place]) 04/30/2020   Right hemiplegia ([place]) 04/30/2020    Cerebrovascular accident ([place]) 04/21/2020   Atrial fibrillation with RVR ([place]) 04/21/2020   Essential hypertension 04/21/2020   Alcohol abuse 04/21/2020    REFERRING DIAG: s/p CVA  THERAPY DIAG:  Other lack of coordination  Unsteadiness on feet  Abnormality of gait and mobility  Difficulty in walking, not elsewhere classified  Acute ischemic left MCA stroke (HCC)  Muscle weakness (generalized)  Rationale for Evaluation and Treatment Rehabilitation  PERTINENT HISTORY: Pt was doing very well and was walking short distances wqith a cane prior to second stroke in december. At this point she is limited to wheelchair as her main means of mobility. Pt caregiver reports all detailed information. Pt has hesitation with the left lower extremity and significant weakness on the right lower extremity. Pt will take 2-3 steps when transfering. Pt caregiver assists with all transfers.Pt has shower with ledge to steps over ledge when entering and utilizes bedside commode. Pt has 2 level home with chair lift for upstairs access. Pt would like to improve ransfers, improve neglect on her R side and improve her overall strength and mobility.Pt. is a 67 y.o. 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 Pt. has Right sided weakness, and receptive, and expressive aphasia. Pt. has had a previous CVA with right sided weakness in July 2021. PMHx  includes: AFib, Renal Artery thrombosis, situational depression, HTN, Pyelnephritis, Seizure activity, COnstipation, AKI. Vitamin D deficiency, and urinary incontinence.   PRECAUTIONS: fall risk, wearing RLE AFO  SUBJECTIVE:   Patient reports no falls. No other concerns or complaints at this time. No pain.   PAIN:  Are you having pain? No      TODAY'S TREATMENT:  10/27/2022   INTERVENTIONS:   Therex:  AAROM Right hip march; AROM left hip march 2 sets of 12 reps  AROM BLE knee ext -  1x12 reps  Seated blue TB hip abduction/ER: 2x12, some AAROM needed for carryover in form/technique. Able to push with RLE into band.   Neuro RE-ED:   With mirror for visual feedback. X2 STS with 10 R lateral weight shifts. Min to mod multimodal cuing at pelvis for forward and R lateral weight shifts with excellent carryover. minA for STS from lowered mat table with bouts of momentum    Stand pivot minA to CGA for standing, modA with initiating pivot to <> from W/c and mat table.    Gait in // bars x2 laps with seated rest b/t. Improved ability to L weight shift and independently progress RLE during swing phase. Second bout pt requires varying TC's to minA on RLE to progress limb. Does need modA with pivot turns at end of // bars for RLE. modA for eccentric descent into W/c pt visibly fatigued and verbally endorsing fatigue.    PATIENT EDUCATION: Education details: Pt educated throughout session about proper posture and technique with exercises. Improved exercise technique, movement at target joints, use of target muscles after min to mod verbal, visual, tactile cues. Continued education on technique with STS.  Person educated: Patient Education method: Explanation, Demonstration, and Verbal cues Education comprehension: verbalized understanding, returned demonstration, and needs further education   HOME EXERCISE PROGRAM: Continue as previously given;    PT Short Term Goals -       PT SHORT TERM GOAL #1   Title Patient will be independent in home exercise program to improve strength/mobility for better functional independence with ADLs.    Baseline no formal HEP for LE/balance at this time; 02/08/2022=Patient continues to require VC for reminders and assist with hip march/knee ext (seated for strengthening); Reminders to continue to work on static stand at home.    Time 4    Period Weeks    Status Partially Met  04/07/22: complete somewhat regularly     Target Date 12/14/21               PT Long Term Goals -       PT LONG TERM GOAL #1   Title Patient will increase FOTO score to equal to or greater than   41  to demonstrate statistically significant improvement in mobility and quality of life.    Baseline 16 on 2/14; 02/08/2022=Patient unable to complete today- No family present but will attempt again next visit available with family present to assist in completion; 02/17/2022= 40; 06/28/2022- No caregiver present in PT session to assist in answering questions. 08/04/2022= 40    Time 12    Period Weeks    Status On-going    Target Date 10/27/2022       PT LONG TERM GOAL #3   Title Patient  will complete five times sit to stand test in < 30 seconds with UE assistance indicating an increased LE strength and improved balance.    Baseline 58.47 s with UE assist and with A  from PT for R LE positioning; 02/08/2022=54 sec with use of L UE support and Min physical assist to position right LE.  7/6: 30.5 sec, min A for LE positioning, some attempts did not come to full erect posture, did not let go with L UE from armchair with any attempt 05/05/22: 26.65 sec   Time 12    Period Weeks    Status Met    Target Date 05/03/22      PT LONG TERM GOAL #4   Title Patient will increase Berg Balance score by > 6 points to demonstrate decreased fall risk during functional activities.    Baseline 12 on 2/14; 02/08/2022= 13/56  04/07/22: 17  8/3: 19    Time 12    Period Weeks    Status Met    Target Date 05/03/22      PT LONG TERM GOAL #5   Title Pt will transfer from seated position on table/chair to/from transport chair/WC in order to indicate improved transfer ability.    Baseline Requires Min A for LE positioning and to prevent LOB. 02/08/2022=Patient attempted to perform on her own- Unable to push herself up from chair- Very min assist to position right LE into more flexed position and VC for hand placement. Patient able to stand with min Assist with use of gait belt and VC to lean;  02/17/2022=Patient was able to transfer from transport w/c with some physical assist with Right to swing to pivot to side of chair. She was able to sit to stand today with only CGA and then stand pivot with right arm holding onto arm of chair. Only difficulty was stand to sit- Max VC not to plop into chair. 04/07/22: Pt performed movement of B LE to side of chair independently with chair transfer. Pt required min A with R LE foot placement of floor. Pt requires increased time to complete but complete with CGA only for safety and no cues or A to prevent falling/ plopping into armchair.  8/3: Pt requires min A for maintenance of standig balance, able to navigate from transport chairr to arm chair with hemiwalker, does not controll descent with ahands still " flops" into chair; 08/04/2022- Patient continues to be inconsistent in her transfers- at times only CGA and other more min Assist and repetitive VC. Unclear due to some ongoing cognitive issues if patient will achieve this goal but still presents with potential. 09/08/2022- Patient able to stand with CGA yet continues to require initial VC for proper sequencing.   Time 12    Period Weeks    Status Partially met    Target Date 10/27/2022       PT LONG TERM GOAL #6   Title Patient will ambulate > 150 feet with CGA using Hemiwalker on level surfaces for improved household and short community distances.    Baseline 02/08/2022= 40 feet with use of HW, heavy CGA with max VC for gait sequencing and w/c follow. 5/18 = 85 feet with use of HW, close CGA  7/6: 45 feet, heavy use of hemiwalker 8/3: 85 feet with heavy hemi walker use, no Lob throughout, cues for appropriate weight distribution; 06/28/2022 - 205 feet with use of hemiwalker, CGA; 08/04/2022- Patient presents with inconsistent gait ability - able to walk 150 feet with max effort and increased time using hemiwalker and CGA. 10/18/2022= Patient ambulated around 45 feet at end of session and complained of being  too fatigued to continue- very slow deliberate steps- difficulty advancing right LE  Time 12    Period Weeks    Status On-going    Target Date 10/27/2022        PT LONG TERM GOAL #7   Title Pt will increase 10MWT by at least 0.13 m/s in order to demonstrate clinically significant improvement in community ambulation.    Baseline 02/08/2022= 0.05 m/s using HW 7/6: .075m/s with heavy ue use of hemiwalker and CGA for balance 8/3: .072m/s 06/28/2022= 0.1 m/s (will keep goal active to ensure she is consistent); 08/04/2022- 0.09 m/s); 10/18/2021= 0.07 m/s using hemiwalker   Time 12    Period Weeks    Status Ongoing     Target Date 10/27/2022      8.  Pt will improve 5X STS with full knee extension in standing each rep and with appropriate eccentric control in order to indicate improved transfer efficacy and improved LE strength  Baseline: 52.45 sec with UE support; 06/28/2022= 36.03 sec with UE support (VC for erect standing- patient continues to press up using back of legs against chair to stand); 08/04/2022- Patient required 45 sec using left UE and max VC (flopping at times with sitting); 10/18/2022 = 48 sec with Left UE and continued VC to lean forward and stand erect without falling back into chair- some improved eccentric control with last 2 reps. Goal status: ONGOING TARGET date: 10/27/2022  9.  Patient will increase Berg Balance score by > 6 points to demonstrate decreased fall risk during functional activities Baseline: 19; 06/28/2022= 19/56; 08/04/2022- Deferred secondary to patient too fatigued to attempt after walking today. Will reassess next visit. 10/18/2022=21  Goal status: ONGOING Target Date: 10/27/2022           Plan -     Clinical Impression Statement Pt continuing PT POC with good motivation. Pt at end of POC and will need goals reassessment next visit for d/c or recert. Deferred today for primary PT to reassess and make clinical decision. Pt understanding. Pt demonstrates  improved tolerance for gait today with good carryover in motor control from previous session abel to recall and perform L lateral weight shift and independently clear RLE but does need minA at end of gait today due to fatigue. Pt also still reliant on modA once standing for safe completion of stand pivot transfers due to her RLE hemiplegia. Pt overall had an excellent session today with her gait. Pt does remain a high falls risk with transfers and gait currently. Will perform RECERT next session to determine further POC.    Personal Factors and Comorbidities Age;Comorbidity 1;Comorbidity 2;Comorbidity 3+;Time since onset of injury/illness/exacerbation;Other    Comorbidities AFib, Renal Artery thrombosis, situational depression, HTN, Pyelnephritis, Seizure activity, COnstipation, AKI    Examination-Activity Limitations Bathing;Bed Mobility;Carry;Locomotion Level;Dressing;Sit;Squat;Stairs;Stand;Toileting;Transfers    Examination-Participation Restrictions Church;Community Activity;Laundry    Stability/Clinical Decision Making Evolving/Moderate complexity    Rehab Potential Fair    PT Frequency 2x / week    PT Duration 12 weeks    PT Treatment/Interventions ADLs/Self Care Home Management;Gait training;Stair training;Therapeutic activities;Therapeutic exercise;Balance training;Neuromuscular re-education;Patient/family education;Wheelchair mobility training;Manual techniques;Energy conservation;Dry needling;Passive range of motion;Taping;Spinal Manipulations;Joint Manipulations    PT Next Visit Plan Gait training, Transfer training, LE strengthening as appropriate.    PT Home Exercise Plan HEP: general LE and postural exercises    Consulted and Agree with Plan of Care Patient            Salem Caster. Fairly IV, PT, DPT Physical Therapist- [facility]  10/27/22, 1:52 PM

## 2022-10-31 NOTE — Therapy (Signed)
OUTPATIENT PHYSICAL THERAPY TREATMENT NOTE/RECERTIFICATION  Patient Name: Lindsey [name] MRN: [id number] DOB:Sep 04, 1956, 67 y.o., female Today's Date: 11/01/2022  PCP: Dr. Tomasa Hose MD REFERRING PROVIDER: Leeroy Cha MD   PT End of Session - 11/01/22 1101     Visit Number 41    Number of Visits 31    Date for PT Re-Evaluation 01/24/23    Authorization Type Medicare    Authorization Time Period Recert 04/10/6282- 6/62/9476    Progress Note Due on Visit 80    PT Start Time 1101    PT Stop Time 1144    PT Time Calculation (min) 43 min    Equipment Utilized During Treatment Gait belt    Activity Tolerance Patient tolerated treatment well    Behavior During Therapy WFL for tasks assessed/performed                               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 No pertinent surgical history. 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 ([place]) 09/24/2021   Cerebral embolism with cerebral infarction 09/23/2021   Pyelonephritis 09/19/2021   Pain due to onychomycosis of toenails of both feet 11/19/2020   Normocytic anemia 05/31/2020   Acute ischemic left MCA stroke ([place]) 04/30/2020   Right hemiplegia ([place]) 04/30/2020   Cerebrovascular accident ([place]) 04/21/2020    Atrial fibrillation with RVR ([place]) 04/21/2020   Essential hypertension 04/21/2020   Alcohol abuse 04/21/2020    REFERRING DIAG: s/p CVA  THERAPY DIAG:  Other lack of coordination  Unsteadiness on feet  Abnormality of gait and mobility  Difficulty in walking, not elsewhere classified  Acute ischemic left MCA stroke (HCC)  Muscle weakness (generalized)  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 At this point she is limited to wheelchair as her main means of mobility. Pt caregiver reports all detailed information. Pt has hesitation with the left lower extremity and significant weakness on the right lower extremity. Pt will take 2-3 steps when transfering. Pt caregiver assists with all transfers.Pt has shower with ledge to steps over ledge when entering and utilizes bedside commode. Pt has 2 level home with chair lift for upstairs access. Pt would like to improve ransfers, improve neglect on her R side and improve her overall strength and mobility.Pt. is a 67 y.o. 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 Pt. has Right sided weakness, and receptive, and  expressive aphasia. Pt. has had a previous CVA with right sided weakness in July 2021. PMHx includes: AFib, Renal Artery thrombosis, situational depression, HTN, Pyelnephritis, Seizure activity, COnstipation, AKI. Vitamin D deficiency, and urinary incontinence.   PRECAUTIONS: fall risk, wearing RLE AFO  SUBJECTIVE:   Patient states doing okay today without any new issues. Accompanied by her daughter.   PAIN:  Are you having pain? No      TODAY'S TREATMENT:  11/01/2022   INTERVENTIONS:    Therapeutic activities:  Reassessed goals for recert today- please refer to goal section for updates and details.     PATIENT EDUCATION: Education details: Pt educated throughout session about proper posture and technique with exercises. Improved exercise technique, movement at target joints, use of target muscles after min to mod verbal, visual, tactile cues. Continued education on technique with STS.  Person educated: Patient Education method: Explanation, Demonstration, and Verbal cues Education comprehension: verbalized understanding, returned demonstration, and needs further education   HOME EXERCISE PROGRAM: Continue as previously given;    PT Short Term Goals -       PT SHORT TERM GOAL #1   Title Patient will be independent in home exercise program to improve strength/mobility for better functional independence with ADLs.    Baseline no formal HEP for LE/balance at this time; 02/08/2022=Patient continues to require VC for reminders and assist with hip march/knee ext (seated for strengthening); Reminders to continue to work on static stand at home.    Time 4    Period Weeks    Status Partially Met  04/07/22: complete somewhat regularly     Target Date 12/14/21              PT Long Term Goals -       PT LONG TERM GOAL #1   Title Patient will increase FOTO score to equal to or greater than   41  to demonstrate statistically significant improvement in mobility and quality of life.    Baseline 16 on 2/14; 02/08/2022=Patient unable to complete today- No family present but will attempt again next visit available with family present to assist in completion; 02/17/2022= 40; 06/28/2022- No caregiver present in PT session to assist in answering questions. 08/04/2022= 40; 11/01/2022= unable to complete- caregiver received a call and not in room to assist patient today.    Time 12    Period Weeks    Status On-going    Target Date 01/24/2023       PT LONG TERM GOAL #3   Title  Patient  will complete five times sit to stand test in < 30 seconds with UE assistance indicating an increased LE strength and improved balance.    Baseline 58.47 s with UE assist and with A from PT for R LE positioning; 02/08/2022=54 sec with use of L UE support and Min physical assist to position right LE.  7/6: 30.5 sec, min A for LE positioning, some attempts did not come to full erect posture, did not let go with L UE from armchair with any attempt 05/05/22: 26.65 sec   Time 12    Period Weeks    Status Met    Target Date 05/03/22      PT LONG TERM GOAL #4   Title Patient will increase Berg Balance score by > 6 points to demonstrate decreased fall risk during functional activities.    Baseline 12 on 2/14; 02/08/2022= 13/56  04/07/22: 17  8/3: 19    Time 12  Period Weeks    Status Met    Target Date 05/03/22      PT LONG TERM GOAL #5   Title Pt will transfer from seated position on table/chair to/from transport chair/WC in order to indicate improved transfer ability.    Baseline Requires Min A for LE positioning and to prevent LOB. 02/08/2022=Patient attempted to perform on her own- Unable to push herself up from chair- Very min assist to position right LE into more flexed position and VC for hand placement. Patient able to stand with min Assist with use of gait belt and VC to lean; 02/17/2022=Patient was able to transfer from transport w/c with some physical assist with Right to swing to pivot to side of chair. She was able to sit to stand today with only CGA and then stand pivot with right arm holding onto arm of chair. Only difficulty was stand to sit- Max VC not to plop into chair. 04/07/22: Pt performed movement of B LE to side of chair independently with chair transfer. Pt required min A with R LE foot placement of floor. Pt requires increased time to complete but complete with CGA only for safety and no cues or A to prevent falling/ plopping into armchair.  8/3: Pt requires min A for  maintenance of standig balance, able to navigate from transport chairr to arm chair with hemiwalker, does not controll descent with ahands still " flops" into chair; 08/04/2022- Patient continues to be inconsistent in her transfers- at times only CGA and other more min Assist and repetitive VC. Unclear due to some ongoing cognitive issues if patient will achieve this goal but still presents with potential. 09/08/2022- Patient able to stand with CGA yet continues to require initial VC for proper sequencing.   Time 12    Period Weeks    Status Partially met    Target Date 10/27/2022       PT LONG TERM GOAL #6   Title Patient will ambulate > 150 feet with CGA using Hemiwalker on level surfaces for improved household and short community distances.    Baseline 02/08/2022= 40 feet with use of HW, heavy CGA with max VC for gait sequencing and w/c follow. 5/18 = 85 feet with use of HW, close CGA  7/6: 45 feet, heavy use of hemiwalker 8/3: 85 feet with heavy hemi walker use, no Lob throughout, cues for appropriate weight distribution; 06/28/2022 - 205 feet with use of hemiwalker, CGA; 08/04/2022- Patient presents with inconsistent gait ability - able to walk 150 feet with max effort and increased time using hemiwalker and CGA. 10/18/2022= Patient ambulated around 45 feet at end of session and complained of being too fatigued to continue- very slow deliberate steps- difficulty advancing right LE; 11/01/2022- Patient ambulated approx 50 feet total today- using hemiwalker with step to gait- Mod VC for gait sequencing and close w/c follow- Decreased step height/length on affected right side.    Time 12    Period Weeks    Status On-going    Target Date 01/24/2023        PT LONG TERM GOAL #7   Title Pt will increase 10MWT by at least 0.13 m/s in order to demonstrate clinically significant improvement in community ambulation.    Baseline 02/08/2022= 0.05 m/s using HW 7/6: .059m/s with heavy ue use of hemiwalker and CGA for  balance 8/3: .020m/s 06/28/2022= 0.1 m/s (will keep goal active to ensure she is consistent); 08/04/2022- 0.09 m/s); 10/18/2021= 0.07 m/s using hemiwalker; 11/01/2022=  0.07 m/s   Time 12    Period Weeks    Status Ongoing     Target Date 01/24/2023      8.  Pt will improve 5X STS with full knee extension in standing each rep and with appropriate eccentric control in order to indicate improved transfer efficacy and improved LE strength  Baseline: 52.45 sec with UE support; 06/28/2022= 36.03 sec with UE support (VC for erect standing- patient continues to press up using back of legs against chair to stand); 08/04/2022- Patient required 45 sec using left UE and max VC (flopping at times with sitting); 10/18/2022 = 48 sec with Left UE and continued VC to lean forward and stand erect without falling back into chair- some improved eccentric control with last 2 reps. 11/01/2022= 39.7 sec with LE UE and CGA with VC to lean forward.  Goal status: ONGOING TARGET date: 01/24/2023  9.  Patient will increase Berg Balance score by > 6 points to demonstrate decreased fall risk during functional activities Baseline: 19; 06/28/2022= 19/56; 08/04/2022- Deferred secondary to patient too fatigued to attempt after walking today. Will reassess next visit. 10/18/2022=21  Goal status: ONGOING Target Date: 01/24/2023           Plan -     Clinical Impression Statement Pt continuing PT POC with recert visit performed today. Overall, patient has struggled more this cert- may be due to moving from home to long term care facility. She remains mostly non-verbal with difficulty following cues consistently. She at times presents with ability to stand with minimal VC and CGA yet others requires more physical assist. Her gait distances have decreased overall but over past few visit back to improving. No significant improvement in gait speed tested today and she does remain at high risk for falling with transfers and gait. Will continue on  another recert to see if patient able to progress her independence with mobility. Patient's condition has the potential to improve in response to therapy. Maximum improvement is yet to be obtained. The anticipated improvement is attainable and reasonable in a generally predictable time.  Patient will continue to benefit from skilled PT services to assist with promoting increased LE strength, functional mobility, and more independent function.    Personal Factors and Comorbidities Age;Comorbidity 1;Comorbidity 2;Comorbidity 3+;Time since onset of injury/illness/exacerbation;Other    Comorbidities AFib, Renal Artery thrombosis, situational depression, HTN, Pyelnephritis, Seizure activity, COnstipation, AKI    Examination-Activity Limitations Bathing;Bed Mobility;Carry;Locomotion Level;Dressing;Sit;Squat;Stairs;Stand;Toileting;Transfers    Examination-Participation Restrictions Church;Community Activity;Laundry    Stability/Clinical Decision Making Evolving/Moderate complexity    Rehab Potential Fair    PT Frequency 1-2x / week    PT Duration 12 weeks    PT Treatment/Interventions ADLs/Self Care Home Management;Gait training;Stair training;Therapeutic activities;Therapeutic exercise;Balance training;Neuromuscular re-education;Patient/family education;Wheelchair mobility training;Manual techniques;Energy conservation;Dry needling;Passive range of motion;Taping;Spinal Manipulations;Joint Manipulations    PT Next Visit Plan Gait training, Transfer training, LE strengthening as appropriate.    PT Home Exercise Plan HEP: general LE and postural exercises    Consulted and Agree with Plan of Care Patient            Ollen Bowl, PT Physical Therapist- [facility]  11/01/22, 3:37 PM

## 2022-11-01 ENCOUNTER — Ambulatory Visit: Payer: Medicare Other | Admitting: Physical Therapy

## 2022-11-01 ENCOUNTER — Ambulatory Visit: Payer: Medicare Other

## 2022-11-01 ENCOUNTER — Encounter: Payer: Medicare Other | Admitting: Occupational Therapy

## 2022-11-01 ENCOUNTER — Ambulatory Visit: Payer: Medicare Other | Admitting: Occupational Therapy

## 2022-11-01 ENCOUNTER — Encounter: Payer: Medicare Other | Admitting: Speech Pathology

## 2022-11-01 DIAGNOSIS — R278 Other lack of coordination: Secondary | ICD-10-CM | POA: Diagnosis not present

## 2022-11-01 DIAGNOSIS — R269 Unspecified abnormalities of gait and mobility: Secondary | ICD-10-CM

## 2022-11-01 DIAGNOSIS — R2681 Unsteadiness on feet: Secondary | ICD-10-CM

## 2022-11-01 DIAGNOSIS — I63512 Cerebral infarction due to unspecified occlusion or stenosis of left middle cerebral artery: Secondary | ICD-10-CM

## 2022-11-01 DIAGNOSIS — R2689 Other abnormalities of gait and mobility: Secondary | ICD-10-CM

## 2022-11-01 DIAGNOSIS — R262 Difficulty in walking, not elsewhere classified: Secondary | ICD-10-CM

## 2022-11-01 DIAGNOSIS — I63542 Cerebral infarction due to unspecified occlusion or stenosis of left cerebellar artery: Secondary | ICD-10-CM

## 2022-11-01 DIAGNOSIS — M6281 Muscle weakness (generalized): Secondary | ICD-10-CM

## 2022-11-02 NOTE — Therapy (Incomplete)
OUTPATIENT PHYSICAL THERAPY TREATMENT NOTE/RECERTIFICATION  Patient Name: Lindsey [name] MRN: [id number] DOB:Sep 04, 1956, 67 y.o., female Today's Date: 11/01/2022  PCP: Dr. Tomasa Hose MD REFERRING PROVIDER: Leeroy Cha MD   PT End of Session - 11/01/22 1101     Visit Number 41    Number of Visits 31    Date for PT Re-Evaluation 01/24/23    Authorization Type Medicare    Authorization Time Period Recert 04/10/6282- 6/62/9476    Progress Note Due on Visit 80    PT Start Time 1101    PT Stop Time 1144    PT Time Calculation (min) 43 min    Equipment Utilized During Treatment Gait belt    Activity Tolerance Patient tolerated treatment well    Behavior During Therapy WFL for tasks assessed/performed                               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 No pertinent surgical history. 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 ([place]) 09/24/2021   Cerebral embolism with cerebral infarction 09/23/2021   Pyelonephritis 09/19/2021   Pain due to onychomycosis of toenails of both feet 11/19/2020   Normocytic anemia 05/31/2020   Acute ischemic left MCA stroke ([place]) 04/30/2020   Right hemiplegia ([place]) 04/30/2020   Cerebrovascular accident ([place]) 04/21/2020    Atrial fibrillation with RVR ([place]) 04/21/2020   Essential hypertension 04/21/2020   Alcohol abuse 04/21/2020    REFERRING DIAG: s/p CVA  THERAPY DIAG:  Other lack of coordination  Unsteadiness on feet  Abnormality of gait and mobility  Difficulty in walking, not elsewhere classified  Acute ischemic left MCA stroke (HCC)  Muscle weakness (generalized)  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 At this point she is limited to wheelchair as her main means of mobility. Pt caregiver reports all detailed information. Pt has hesitation with the left lower extremity and significant weakness on the right lower extremity. Pt will take 2-3 steps when transfering. Pt caregiver assists with all transfers.Pt has shower with ledge to steps over ledge when entering and utilizes bedside commode. Pt has 2 level home with chair lift for upstairs access. Pt would like to improve ransfers, improve neglect on her R side and improve her overall strength and mobility.Pt. is a 67 y.o. 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 Pt. has Right sided weakness, and receptive, and  expressive aphasia. Pt. has had a previous CVA with right sided weakness in July 2021. PMHx includes: AFib, Renal Artery thrombosis, situational depression, HTN, Pyelnephritis, Seizure activity, COnstipation, AKI. Vitamin D deficiency, and urinary incontinence.   PRECAUTIONS: fall risk, wearing RLE AFO  SUBJECTIVE:   Patient states doing okay today without any new issues. Accompanied by her daughter.   PAIN:  Are you having pain? No      TODAY'S TREATMENT:  11/01/2022   INTERVENTIONS:    Therapeutic activities:  Reassessed goals for recert today- please refer to goal section for updates and details.     PATIENT EDUCATION: Education details: Pt educated throughout session about proper posture and technique with exercises. Improved exercise technique, movement at target joints, use of target muscles after min to mod verbal, visual, tactile cues. Continued education on technique with STS.  Person educated: Patient Education method: Explanation, Demonstration, and Verbal cues Education comprehension: verbalized understanding, returned demonstration, and needs further education   HOME EXERCISE PROGRAM: Continue as previously given;    PT Short Term Goals -       PT SHORT TERM GOAL #1   Title Patient will be independent in home exercise program to improve strength/mobility for better functional independence with ADLs.    Baseline no formal HEP for LE/balance at this time; 02/08/2022=Patient continues to require VC for reminders and assist with hip march/knee ext (seated for strengthening); Reminders to continue to work on static stand at home.    Time 4    Period Weeks    Status Partially Met  04/07/22: complete somewhat regularly     Target Date 12/14/21              PT Long Term Goals -       PT LONG TERM GOAL #1   Title Patient will increase FOTO score to equal to or greater than   41  to demonstrate statistically significant improvement in mobility and quality of life.    Baseline 16 on 2/14; 02/08/2022=Patient unable to complete today- No family present but will attempt again next visit available with family present to assist in completion; 02/17/2022= 40; 06/28/2022- No caregiver present in PT session to assist in answering questions. 08/04/2022= 40; 11/01/2022= unable to complete- caregiver received a call and not in room to assist patient today.    Time 12    Period Weeks    Status On-going    Target Date 01/24/2023       PT LONG TERM GOAL #3   Title  Patient  will complete five times sit to stand test in < 30 seconds with UE assistance indicating an increased LE strength and improved balance.    Baseline 58.47 s with UE assist and with A from PT for R LE positioning; 02/08/2022=54 sec with use of L UE support and Min physical assist to position right LE.  7/6: 30.5 sec, min A for LE positioning, some attempts did not come to full erect posture, did not let go with L UE from armchair with any attempt 05/05/22: 26.65 sec   Time 12    Period Weeks    Status Met    Target Date 05/03/22      PT LONG TERM GOAL #4   Title Patient will increase Berg Balance score by > 6 points to demonstrate decreased fall risk during functional activities.    Baseline 12 on 2/14; 02/08/2022= 13/56  04/07/22: 17  8/3: 19    Time 12  Period Weeks    Status Met    Target Date 05/03/22      PT LONG TERM GOAL #5   Title Pt will transfer from seated position on table/chair to/from transport chair/WC in order to indicate improved transfer ability.    Baseline Requires Min A for LE positioning and to prevent LOB. 02/08/2022=Patient attempted to perform on her own- Unable to push herself up from chair- Very min assist to position right LE into more flexed position and VC for hand placement. Patient able to stand with min Assist with use of gait belt and VC to lean; 02/17/2022=Patient was able to transfer from transport w/c with some physical assist with Right to swing to pivot to side of chair. She was able to sit to stand today with only CGA and then stand pivot with right arm holding onto arm of chair. Only difficulty was stand to sit- Max VC not to plop into chair. 04/07/22: Pt performed movement of B LE to side of chair independently with chair transfer. Pt required min A with R LE foot placement of floor. Pt requires increased time to complete but complete with CGA only for safety and no cues or A to prevent falling/ plopping into armchair.  8/3: Pt requires min A for  maintenance of standig balance, able to navigate from transport chairr to arm chair with hemiwalker, does not controll descent with ahands still " flops" into chair; 08/04/2022- Patient continues to be inconsistent in her transfers- at times only CGA and other more min Assist and repetitive VC. Unclear due to some ongoing cognitive issues if patient will achieve this goal but still presents with potential. 09/08/2022- Patient able to stand with CGA yet continues to require initial VC for proper sequencing.   Time 12    Period Weeks    Status Partially met    Target Date 10/27/2022       PT LONG TERM GOAL #6   Title Patient will ambulate > 150 feet with CGA using Hemiwalker on level surfaces for improved household and short community distances.    Baseline 02/08/2022= 40 feet with use of HW, heavy CGA with max VC for gait sequencing and w/c follow. 5/18 = 85 feet with use of HW, close CGA  7/6: 45 feet, heavy use of hemiwalker 8/3: 85 feet with heavy hemi walker use, no Lob throughout, cues for appropriate weight distribution; 06/28/2022 - 205 feet with use of hemiwalker, CGA; 08/04/2022- Patient presents with inconsistent gait ability - able to walk 150 feet with max effort and increased time using hemiwalker and CGA. 10/18/2022= Patient ambulated around 45 feet at end of session and complained of being too fatigued to continue- very slow deliberate steps- difficulty advancing right LE; 11/01/2022- Patient ambulated approx 50 feet total today- using hemiwalker with step to gait- Mod VC for gait sequencing and close w/c follow- Decreased step height/length on affected right side.    Time 12    Period Weeks    Status On-going    Target Date 01/24/2023        PT LONG TERM GOAL #7   Title Pt will increase 10MWT by at least 0.13 m/s in order to demonstrate clinically significant improvement in community ambulation.    Baseline 02/08/2022= 0.05 m/s using HW 7/6: .030m/s with heavy ue use of hemiwalker and CGA for  balance 8/3: .070m/s 06/28/2022= 0.1 m/s (will keep goal active to ensure she is consistent); 08/04/2022- 0.09 m/s); 10/18/2021= 0.07 m/s using hemiwalker; 11/01/2022=  0.07 m/s   Time 12    Period Weeks    Status Ongoing     Target Date 01/24/2023      8.  Pt will improve 5X STS with full knee extension in standing each rep and with appropriate eccentric control in order to indicate improved transfer efficacy and improved LE strength  Baseline: 52.45 sec with UE support; 06/28/2022= 36.03 sec with UE support (VC for erect standing- patient continues to press up using back of legs against chair to stand); 08/04/2022- Patient required 45 sec using left UE and max VC (flopping at times with sitting); 10/18/2022 = 48 sec with Left UE and continued VC to lean forward and stand erect without falling back into chair- some improved eccentric control with last 2 reps. 11/01/2022= 39.7 sec with LE UE and CGA with VC to lean forward.  Goal status: ONGOING TARGET date: 01/24/2023  9.  Patient will increase Berg Balance score by > 6 points to demonstrate decreased fall risk during functional activities Baseline: 19; 06/28/2022= 19/56; 08/04/2022- Deferred secondary to patient too fatigued to attempt after walking today. Will reassess next visit. 10/18/2022=21  Goal status: ONGOING Target Date: 01/24/2023           Plan -     Clinical Impression Statement Pt continuing PT POC with recert visit performed today. Overall, patient has struggled more this cert- may be due to moving from home to long term care facility. She remains mostly non-verbal with difficulty following cues consistently. She at times presents with ability to stand with minimal VC and CGA yet others requires more physical assist. Her gait distances have decreased overall but over past few visit back to improving. No significant improvement in gait speed tested today and she does remain at high risk for falling with transfers and gait. Will continue on  another recert to see if patient able to progress her independence with mobility. Patient's condition has the potential to improve in response to therapy. Maximum improvement is yet to be obtained. The anticipated improvement is attainable and reasonable in a generally predictable time.  Patient will continue to benefit from skilled PT services to assist with promoting increased LE strength, functional mobility, and more independent function.    Personal Factors and Comorbidities Age;Comorbidity 1;Comorbidity 2;Comorbidity 3+;Time since onset of injury/illness/exacerbation;Other    Comorbidities AFib, Renal Artery thrombosis, situational depression, HTN, Pyelnephritis, Seizure activity, COnstipation, AKI    Examination-Activity Limitations Bathing;Bed Mobility;Carry;Locomotion Level;Dressing;Sit;Squat;Stairs;Stand;Toileting;Transfers    Examination-Participation Restrictions Church;Community Activity;Laundry    Stability/Clinical Decision Making Evolving/Moderate complexity    Rehab Potential Fair    PT Frequency 1-2x / week    PT Duration 12 weeks    PT Treatment/Interventions ADLs/Self Care Home Management;Gait training;Stair training;Therapeutic activities;Therapeutic exercise;Balance training;Neuromuscular re-education;Patient/family education;Wheelchair mobility training;Manual techniques;Energy conservation;Dry needling;Passive range of motion;Taping;Spinal Manipulations;Joint Manipulations    PT Next Visit Plan Gait training, Transfer training, LE strengthening as appropriate.    PT Home Exercise Plan HEP: general LE and postural exercises    Consulted and Agree with Plan of Care Patient            Ollen Bowl, PT Physical Therapist- [facility]  11/01/22, 3:37 PM

## 2022-11-03 ENCOUNTER — Ambulatory Visit: Payer: Medicare Other

## 2022-11-03 ENCOUNTER — Encounter: Payer: Medicare Other | Admitting: Occupational Therapy

## 2022-11-03 ENCOUNTER — Ambulatory Visit: Payer: Medicare Other | Admitting: Physical Therapy

## 2022-11-03 ENCOUNTER — Encounter: Payer: Medicare Other | Admitting: Speech Pathology

## 2022-11-08 ENCOUNTER — Ambulatory Visit: Payer: Medicare Other | Admitting: Physical Therapy

## 2022-11-08 ENCOUNTER — Ambulatory Visit: Payer: Medicare Other | Attending: Physical Medicine and Rehabilitation

## 2022-11-08 ENCOUNTER — Encounter: Payer: Medicare Other | Admitting: Occupational Therapy

## 2022-11-08 ENCOUNTER — Ambulatory Visit: Payer: Medicare Other | Admitting: Occupational Therapy

## 2022-11-08 ENCOUNTER — Encounter: Payer: Medicare Other | Admitting: Speech Pathology

## 2022-11-08 DIAGNOSIS — R278 Other lack of coordination: Secondary | ICD-10-CM | POA: Diagnosis present

## 2022-11-08 DIAGNOSIS — I63512 Cerebral infarction due to unspecified occlusion or stenosis of left middle cerebral artery: Secondary | ICD-10-CM | POA: Insufficient documentation

## 2022-11-08 DIAGNOSIS — I63542 Cerebral infarction due to unspecified occlusion or stenosis of left cerebellar artery: Secondary | ICD-10-CM | POA: Diagnosis present

## 2022-11-08 DIAGNOSIS — M6281 Muscle weakness (generalized): Secondary | ICD-10-CM | POA: Diagnosis present

## 2022-11-08 DIAGNOSIS — R262 Difficulty in walking, not elsewhere classified: Secondary | ICD-10-CM

## 2022-11-08 DIAGNOSIS — R2689 Other abnormalities of gait and mobility: Secondary | ICD-10-CM | POA: Insufficient documentation

## 2022-11-08 DIAGNOSIS — R269 Unspecified abnormalities of gait and mobility: Secondary | ICD-10-CM | POA: Diagnosis present

## 2022-11-08 DIAGNOSIS — R2681 Unsteadiness on feet: Secondary | ICD-10-CM | POA: Insufficient documentation

## 2022-11-08 NOTE — Therapy (Signed)
OUTPATIENT PHYSICAL THERAPY TREATMENT NOTE  Patient Name: Lindsey [name] MRN: [id number] DOB:[birth year], 67 y.o., female Today's Date: 11/08/2022  PCP: Dr. Tomasa Hose MD REFERRING PROVIDER: Leeroy Cha MD   PT End of Session - 11/08/22 1132     Visit Number 60    Number of Visits 94    Date for PT Re-Evaluation 01/24/23    Authorization Type Medicare    Authorization Time Period Recert 01/25/9562- 8/75/6433    Progress Note Due on Visit 80    PT Start Time 1116    PT Stop Time 1144    PT Time Calculation (min) 28 min    Equipment Utilized During Treatment Gait belt    Activity Tolerance Patient tolerated treatment well;No increased pain    Behavior During Therapy WFL for tasks assessed/performed                               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 Patient Active Problem List   Diagnosis Date Noted   Blood clotting disorder (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 ([place]) 09/24/2021   Cerebral embolism with cerebral infarction 09/23/2021   Pyelonephritis 09/19/2021   Pain due to onychomycosis of toenails of both feet 11/19/2020   Normocytic anemia 05/31/2020   Acute ischemic left MCA stroke ([place]) 04/30/2020   Right hemiplegia ([place]) 04/30/2020   Cerebrovascular accident ([place]) 04/21/2020   Atrial fibrillation  with RVR ([place]) 04/21/2020   Essential hypertension 04/21/2020   Alcohol abuse 04/21/2020    REFERRING DIAG: s/p CVA  THERAPY DIAG:  Other lack of coordination  Unsteadiness on feet  Abnormality of gait and mobility  Difficulty in walking, not elsewhere classified  Rationale for Evaluation and Treatment Rehabilitation  PERTINENT HISTORY: PTA was a short distance AMB c SPC prior to recent stroke in December 2022. Pt since using WC for primary mobility. Pt has hesitation with LLE, significantly weak RLE. Caregiver assists with all transfers. Pt has 2 level home, chair lift for 2nd level. Pt would like to improve transfers, improve Rt neglect, and progress general strength. 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 Primary impairment includes Rt hemiplegia and global aphasia. H/o prior CVA c Rt hemiplegia July 2021. PMH: AF, Renal Artery thrombosis, HTN, pyelonephritis, seizure activity, constipation, AKI, Vitamin D deficiency, and urinary incontinence.    PRECAUTIONS: fall risk, wearing RLE AFO  SUBJECTIVE:   Patient states doing okay today without any new issues. Accompanied by her daughter. She denies any pain today.   PAIN:  Are you having pain? No   TODAY'S TREATMENT:  11/08/22  INTERVENTIONS:  Seated AAROM Right hip march 1x12 (mostly p/ROM, no activation felt)  Seated AROM left hip march 1x12 Seated AROM  knee ext - 1x12 bilat LUE reach to floor then overhead 10 LUE medial reach to lateral reach x10, cues for far effort, concurrent trunk rotation  Functional load transfer (5 free weights under 3 lbs) from left of seat to right of seat twice, then from side to top of yoga block on floor STS from elevated plinth, Left HW, Rt AFO, wash cloth under forefoot, 3x with mina lift from bilat hips FWD steptraining x4 ft (~6 minutes)     PATIENT EDUCATION: Education details: Pt educated throughout session about proper posture and technique with  exercises. Improved exercise technique, movement at target joints, use of target muscles after min to mod verbal, visual, tactile cues. Continued education on technique with STS.  Person educated: Patient Education method: Explanation, Demonstration, and Verbal cues Education comprehension: verbalized understanding, returned demonstration, and needs further education   HOME EXERCISE PROGRAM: Continue as previously given;    PT Short Term Goals -       PT SHORT TERM GOAL #1   Title Patient will be independent in home exercise program to improve strength/mobility for better functional independence with ADLs.    Baseline no formal HEP for LE/balance at this time; 02/08/2022=Patient continues to require VC for reminders and assist with hip march/knee ext (seated for strengthening); Reminders to continue to work on static stand at home.    Time 4    Period Weeks    Status Partially Met  04/07/22: complete somewhat regularly     Target Date 12/14/21              PT Long Term Goals -       PT LONG TERM GOAL #1   Title Patient will increase FOTO score to equal to or greater than   41  to demonstrate statistically significant improvement in mobility and quality of life.    Baseline 16 on 2/14; 02/08/2022=Patient unable to complete today- No family present but will attempt again next visit available with family present to assist in completion; 02/17/2022= 40; 06/28/2022- No caregiver present in PT session to assist in answering questions. 08/04/2022= 40; 11/01/2022= unable to complete- caregiver received a call and not in room to assist patient today.    Time 12    Period Weeks    Status On-going    Target Date 01/24/2023       PT LONG TERM GOAL #3   Title Patient  will complete five times sit to stand test in < 30 seconds with UE assistance indicating an increased LE strength and improved balance.    Baseline 58.47 s with UE assist and with A from PT for R LE positioning; 02/08/2022=54 sec  with use of L UE support and Min physical assist to position right LE.  7/6: 30.5 sec, min A for LE positioning, some attempts did not come to full erect posture, did not let go with L UE from armchair with any attempt 05/05/22: 26.65 sec   Time 12    Period Weeks    Status Met    Target Date 05/03/22      PT LONG TERM GOAL #4   Title Patient will increase Berg Balance score by > 6 points to demonstrate decreased fall risk during functional activities.    Baseline 12 on 2/14; 02/08/2022= 13/56  04/07/22: 17  8/3: 19    Time 12    Period Weeks    Status Met    Target Date 05/03/22      PT  LONG TERM GOAL #5   Title Pt will transfer from seated position on table/chair to/from transport chair/WC in order to indicate improved transfer ability.    Baseline Requires Min A for LE positioning and to prevent LOB. 02/08/2022=Patient attempted to perform on her own- Unable to push herself up from chair- Very min assist to position right LE into more flexed position and VC for hand placement. Patient able to stand with min Assist with use of gait belt and VC to lean; 02/17/2022=Patient was able to transfer from transport w/c with some physical assist with Right to swing to pivot to side of chair. She was able to sit to stand today with only CGA and then stand pivot with right arm holding onto arm of chair. Only difficulty was stand to sit- Max VC not to plop into chair. 04/07/22: Pt performed movement of B LE to side of chair independently with chair transfer. Pt required min A with R LE foot placement of floor. Pt requires increased time to complete but complete with CGA only for safety and no cues or A to prevent falling/ plopping into armchair.  8/3: Pt requires min A for maintenance of standig balance, able to navigate from transport chairr to arm chair with hemiwalker, does not controll descent with ahands still " flops" into chair; 08/04/2022- Patient continues to be inconsistent in her transfers- at times only  CGA and other more min Assist and repetitive VC. Unclear due to some ongoing cognitive issues if patient will achieve this goal but still presents with potential. 09/08/2022- Patient able to stand with CGA yet continues to require initial VC for proper sequencing.   Time 12    Period Weeks    Status Partially met    Target Date 10/27/2022       PT LONG TERM GOAL #6   Title Patient will ambulate > 150 feet with CGA using Hemiwalker on level surfaces for improved household and short community distances.    Baseline 02/08/2022= 40 feet with use of HW, heavy CGA with max VC for gait sequencing and w/c follow. 5/18 = 85 feet with use of HW, close CGA  7/6: 45 feet, heavy use of hemiwalker 8/3: 85 feet with heavy hemi walker use, no Lob throughout, cues for appropriate weight distribution; 06/28/2022 - 205 feet with use of hemiwalker, CGA; 08/04/2022- Patient presents with inconsistent gait ability - able to walk 150 feet with max effort and increased time using hemiwalker and CGA. 10/18/2022= Patient ambulated around 45 feet at end of session and complained of being too fatigued to continue- very slow deliberate steps- difficulty advancing right LE; 11/01/2022- Patient ambulated approx 50 feet total today- using hemiwalker with step to gait- Mod VC for gait sequencing and close w/c follow- Decreased step height/length on affected right side.    Time 12    Period Weeks    Status On-going    Target Date 01/24/2023        PT LONG TERM GOAL #7   Title Pt will increase by at least 0.13 m/s in order to demonstrate clinically significant improvement in community ambulation.    Baseline 02/08/2022= 0.05 m/s using HW 7/6: .069m/s with heavy ue use of hemiwalker and CGA for balance 8/3: .067m/s 06/28/2022= 0.1 m/s (will keep goal active to ensure she is consistent); 08/04/2022- 0.09 m/s); 10/18/2021= 0.07 m/s using hemiwalker; 11/01/2022= 0.07 m/s   Time 12    Period Weeks    Status Ongoing  Target Date  01/24/2023      8.  Pt will improve 5X STS with full knee extension in standing each rep and with appropriate eccentric control in order to indicate improved transfer efficacy and improved LE strength  Baseline: 52.45 sec with UE support; 06/28/2022= 36.03 sec with UE support (VC for erect standing- patient continues to press up using back of legs against chair to stand); 08/04/2022- Patient required 45 sec using left UE and max VC (flopping at times with sitting); 10/18/2022 = 48 sec with Left UE and continued VC to lean forward and stand erect without falling back into chair- some improved eccentric control with last 2 reps. 11/01/2022= 39.7 sec with LE UE and CGA with VC to lean forward.  Goal status: ONGOING TARGET date: 01/24/2023  9.  Patient will increase Berg Balance score by > 6 points to demonstrate decreased fall risk during functional activities Baseline: 19; 06/28/2022= 19/56; 08/04/2022- Deferred secondary to patient too fatigued to attempt after walking today. Will reassess next visit. 10/18/2022=21  Goal status: ONGOING Target Date: 01/24/2023           Plan -     Clinical Impression Statement Lots of time working on strength, gross motor control, fine motor control of trunk in sitting, finishing with standing balance and step strategies. Friction gradient from her [employer] shoe Right is too great which makes the ability to initiate swing phase very limited. She has correct timing of Left weight shift, but weakness precludes a much greater amplitude weight shift that would be needed to achieve task. Author places a Oncologist under forefoot of shoe which dramatically improves stepping capacity., albeit it creates som static stance control deficits with slow lateral progression of abducted RLE. Continued with current plan of care as laid out in evaluation and recent prior sessions. All interventions tolerated as expected by author. Mobility and strength continue to progress as anticipated.  Recovery intervals given as needed based on signs of exertion and/or pt request. Pt educated on best technique for each intervention- author uses verbal, visual, tactile cues to optimize learning. Author takes steps to maximize patient independence when appropriate. Pt remains highly motivated. Pt closely monitored throughout session for safe activity response, as well as to maximize patient safety during interventions. The patient's therapy prognosis indicates continued potential for improvement, anticipate that future progress is attainable in a reasonable/predictable timeframe. Maximum improvement is within reach. Pt will continue to benefit from skilled PT services to address deficits and impairment identified in evaluation in order to maximize independence and safety in basic mobility required for performance of ADL, IADL, and leisure.    Personal Factors and Comorbidities Age;Comorbidity 1;Comorbidity 2;Comorbidity 3+;Time since onset of injury/illness/exacerbation;Other    Comorbidities AFib, Renal Artery thrombosis, situational depression, HTN, Pyelnephritis, Seizure activity, COnstipation, AKI    Examination-Activity Limitations Bathing;Bed Mobility;Carry;Locomotion Level;Dressing;Sit;Squat;Stairs;Stand;Toileting;Transfers    Examination-Participation Restrictions Church;Community Activity;Laundry    Stability/Clinical Decision Making Evolving/Moderate complexity    Rehab Potential Fair    PT Frequency 1-2x / week    PT Duration 12 weeks    PT Treatment/Interventions ADLs/Self Care Home Management;Gait training;Stair training;Therapeutic activities;Therapeutic exercise;Balance training;Neuromuscular re-education;Patient/family education;Wheelchair mobility training;Manual techniques;Energy conservation;Dry needling;Passive range of motion;Taping;Spinal Manipulations;Joint Manipulations    PT Next Visit Plan Gait training, Transfer training, LE strengthening as appropriate.    PT Home Exercise  Plan HEP: general LE and postural exercises    Consulted and Agree with Plan of Care Patient  12:00 PM, 11/08/22 Etta Grandchild, PT, DPT Physical Therapist - [place] [phone]    11/08/22, 12:00 PM

## 2022-11-10 ENCOUNTER — Encounter: Payer: Medicare Other | Admitting: Speech Pathology

## 2022-11-10 ENCOUNTER — Encounter: Payer: Medicare Other | Admitting: Occupational Therapy

## 2022-11-10 ENCOUNTER — Ambulatory Visit: Payer: Medicare Other | Admitting: Physical Therapy

## 2022-11-10 ENCOUNTER — Ambulatory Visit: Payer: Medicare Other

## 2022-11-15 ENCOUNTER — Ambulatory Visit: Payer: Medicare Other | Admitting: Occupational Therapy

## 2022-11-15 ENCOUNTER — Ambulatory Visit: Payer: Medicare Other | Admitting: Physical Therapy

## 2022-11-15 ENCOUNTER — Encounter: Payer: Self-pay | Admitting: Physical Therapy

## 2022-11-15 ENCOUNTER — Encounter: Payer: Medicare Other | Admitting: Speech Pathology

## 2022-11-15 ENCOUNTER — Encounter: Payer: Medicare Other | Admitting: Occupational Therapy

## 2022-11-15 DIAGNOSIS — M6281 Muscle weakness (generalized): Secondary | ICD-10-CM

## 2022-11-15 DIAGNOSIS — R278 Other lack of coordination: Secondary | ICD-10-CM | POA: Diagnosis not present

## 2022-11-15 DIAGNOSIS — R2681 Unsteadiness on feet: Secondary | ICD-10-CM

## 2022-11-15 DIAGNOSIS — R262 Difficulty in walking, not elsewhere classified: Secondary | ICD-10-CM

## 2022-11-15 DIAGNOSIS — R269 Unspecified abnormalities of gait and mobility: Secondary | ICD-10-CM

## 2022-11-15 NOTE — Therapy (Signed)
OUTPATIENT PHYSICAL THERAPY TREATMENT NOTE  Patient Name: Lindsey [name] MRN: [id number] DOB:03/02/1956, 67 y.o., female Today's Date: 11/15/2022  PCP: Dr. Tomasa Hose MD REFERRING PROVIDER: Leeroy Cha MD   PT End of Session - 11/15/22 1059     Visit Number 101    Number of Visits 59    Date for PT Re-Evaluation 01/24/23    Authorization Type Medicare    Authorization Time Period Recert [id number]    Progress Note Due on Visit 80    PT Start Time 1102    PT Stop Time 1144    PT Time Calculation (min) 42 min    Equipment Utilized During Treatment Gait belt    Activity Tolerance Patient tolerated treatment well;No increased pain    Behavior During Therapy WFL for tasks assessed/performed                               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 No pertinent surgical history. 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 ([place]) 09/24/2021   Cerebral embolism with cerebral infarction 09/23/2021   Pyelonephritis 09/19/2021   Pain due to onychomycosis of toenails of both feet 11/19/2020   Normocytic anemia 05/31/2020   Acute ischemic left MCA stroke ([place]) 04/30/2020   Right hemiplegia ([place]) 04/30/2020   Cerebrovascular accident ([place]) 04/21/2020    Atrial fibrillation with RVR ([place]) 04/21/2020   Essential hypertension 04/21/2020   Alcohol abuse 04/21/2020    REFERRING DIAG: s/p CVA  THERAPY DIAG:  Abnormality of gait and mobility  Difficulty in walking, not elsewhere classified  Muscle weakness (generalized)  Unsteadiness on feet  Other lack of coordination  Rationale for Evaluation and Treatment Rehabilitation  PERTINENT HISTORY: PTA was a short distance AMB c SPC prior to recent stroke in December 2022. Pt since using WC for primary mobility. Pt has hesitation with LLE, significantly weak RLE. Caregiver assists with all transfers. Pt has 2 level home, chair lift for 2nd level. Pt would like to improve transfers, improve Rt neglect, and progress general strength. 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 Primary impairment includes Rt hemiplegia and global aphasia. H/o prior CVA c Rt hemiplegia July 2021. PMH: AF, Renal Artery thrombosis, HTN, pyelonephritis, seizure activity, constipation, AKI, Vitamin D deficiency, and urinary incontinence.    PRECAUTIONS: fall risk, wearing RLE AFO  SUBJECTIVE:   Patient states doing okay today without any new issues. No falls since last time.   PAIN:  Are you having pain? No   TODAY'S TREATMENT:  11/15/22  INTERVENTIONS:  Stand pivot transfer to the left to plinth table that is adjustable.   TE Seated AAROM Right hip march  1x12 (mostly p/ROM, no activation felt)  Seated AROM left hip march 1x12 Seated AROM knee ext - 1x12 bilat  TA Seated R LE hip abduction and side step AAROM with PT assistance for car and other transfer part to whole practice  LUE reach anterior to mirror for velcro dart placement 2 x 10 with focus on anterior and right lateral weight shift  - x 10 with forward reach to floor to pick up each ball and then with forward and lateral reach to dart board, pt rates hard for all.  STS from elevated plinth, Left HW, Rt AFO, PT cues for  adequate weight shift to  side for improved balance. Copmlete 10 reps with 30 second prolnged standing each rep with cues for taking small step with LLE when combined with loading R LE. Difficulty with sequencing of this task   Stand pivot transfer to the right ( increased time and difficulty) with min to mod A from PT, from elevated plinth.    PATIENT EDUCATION: Education details: Pt educated throughout session about proper posture and technique with exercises. Improved exercise technique, movement at target joints, use of target muscles after min to mod verbal, visual, tactile cues. Continued education on technique with STS.  Person educated: Patient Education method: Explanation, Demonstration, and Verbal cues Education comprehension: verbalized understanding, returned demonstration, and needs further education   HOME EXERCISE PROGRAM: Continue as previously given;    PT Short Term Goals -       PT SHORT TERM GOAL #1   Title Patient will be independent in home exercise program to improve strength/mobility for better functional independence with ADLs.    Baseline no formal HEP for LE/balance at this time; 02/08/2022=Patient continues to require VC for reminders and assist with hip march/knee ext (seated for strengthening); Reminders to continue to work on static stand at home.    Time 4    Period Weeks    Status Partially Met  04/07/22: complete somewhat regularly     Target Date 12/14/21              PT Long Term Goals -       PT LONG TERM GOAL #1   Title Patient will increase FOTO score to equal to or greater than   41  to demonstrate statistically significant improvement in mobility and quality of life.    Baseline 16 on 2/14; 02/08/2022=Patient unable to complete today- No family present but will attempt again next visit available with family present to assist in completion; 02/17/2022= 40; 06/28/2022- No caregiver present in PT session to assist in answering questions.  08/04/2022= 40; 11/01/2022= unable to complete- caregiver received a call and not in room to assist patient today.    Time 12    Period Weeks    Status On-going    Target Date 01/24/2023       PT LONG TERM GOAL #3   Title Patient  will complete five times sit to stand test in < 30 seconds with UE assistance indicating an increased LE strength and improved balance.    Baseline 58.47 s with UE assist and with A from PT for R LE positioning; 02/08/2022=54 sec with use of L UE support and Min physical assist to position right LE.  7/6: 30.5 sec, min A for LE positioning, some attempts did not come to full erect posture, did not let go with L UE from armchair with any attempt 05/05/22: 26.65 sec   Time 12  Period Weeks    Status Met    Target Date 05/03/22      PT LONG TERM GOAL #4   Title Patient will increase Berg Balance score by > 6 points to demonstrate decreased fall risk during functional activities.    Baseline 12 on 2/14; 02/08/2022= 13/56  04/07/22: 17  8/3: 19    Time 12    Period Weeks    Status Met    Target Date 05/03/22      PT LONG TERM GOAL #5   Title Pt will transfer from seated position on table/chair to/from transport chair/WC in order to indicate improved transfer ability.    Baseline Requires Min A for LE positioning and to prevent LOB. 02/08/2022=Patient attempted to perform on her own- Unable to push herself up from chair- Very min assist to position right LE into more flexed position and VC for hand placement. Patient able to stand with min Assist with use of gait belt and VC to lean; 02/17/2022=Patient was able to transfer from transport w/c with some physical assist with Right to swing to pivot to side of chair. She was able to sit to stand today with only CGA and then stand pivot with right arm holding onto arm of chair. Only difficulty was stand to sit- Max VC not to plop into chair. 04/07/22: Pt performed movement of B LE to side of chair independently with chair transfer.  Pt required min A with R LE foot placement of floor. Pt requires increased time to complete but complete with CGA only for safety and no cues or A to prevent falling/ plopping into armchair.  8/3: Pt requires min A for maintenance of standig balance, able to navigate from transport chairr to arm chair with hemiwalker, does not controll descent with ahands still " flops" into chair; 08/04/2022- Patient continues to be inconsistent in her transfers- at times only CGA and other more min Assist and repetitive VC. Unclear due to some ongoing cognitive issues if patient will achieve this goal but still presents with potential. 09/08/2022- Patient able to stand with CGA yet continues to require initial VC for proper sequencing.   Time 12    Period Weeks    Status Partially met    Target Date 10/27/2022       PT LONG TERM GOAL #6   Title Patient will ambulate > 150 feet with CGA using Hemiwalker on level surfaces for improved household and short community distances.    Baseline 02/08/2022= 40 feet with use of HW, heavy CGA with max VC for gait sequencing and w/c follow. 5/18 = 85 feet with use of HW, close CGA  7/6: 45 feet, heavy use of hemiwalker 8/3: 85 feet with heavy hemi walker use, no Lob throughout, cues for appropriate weight distribution; 06/28/2022 - 205 feet with use of hemiwalker, CGA; 08/04/2022- Patient presents with inconsistent gait ability - able to walk 150 feet with max effort and increased time using hemiwalker and CGA. 10/18/2022= Patient ambulated around 45 feet at end of session and complained of being too fatigued to continue- very slow deliberate steps- difficulty advancing right LE; 11/01/2022- Patient ambulated approx 50 feet total today- using hemiwalker with step to gait- Mod VC for gait sequencing and close w/c follow- Decreased step height/length on affected right side.    Time 12    Period Weeks    Status On-going    Target Date 01/24/2023        PT LONG TERM GOAL #7  Title Pt  will increase 10MWT by at least 0.13 m/s in order to demonstrate clinically significant improvement in community ambulation.    Baseline 02/08/2022= 0.05 m/s using HW 7/6: .029ms with heavy ue use of hemiwalker and CGA for balance 8/3: .0562m 06/28/2022= 0.1 m/s (will keep goal active to ensure she is consistent); 08/04/2022- 0.09 m/s); 10/18/2021= 0.07 m/s using hemiwalker; 11/01/2022= 0.07 m/s   Time 12    Period Weeks    Status Ongoing     Target Date 01/24/2023      8.  Pt will improve 5X STS with full knee extension in standing each rep and with appropriate eccentric control in order to indicate improved transfer efficacy and improved LE strength  Baseline: 52.45 sec with UE support; 06/28/2022= 36.03 sec with UE support (VC for erect standing- patient continues to press up using back of legs against chair to stand); 08/04/2022- Patient required 45 sec using left UE and max VC (flopping at times with sitting); 10/18/2022 = 48 sec with Left UE and continued VC to lean forward and stand erect without falling back into chair- some improved eccentric control with last 2 reps. 11/01/2022= 39.7 sec with LE UE and CGA with VC to lean forward.  Goal status: ONGOING TARGET date: 01/24/2023  9.  Patient will increase Berg Balance score by > 6 points to demonstrate decreased fall risk during functional activities Baseline: 19; 06/28/2022= 19/56; 08/04/2022- Deferred secondary to patient too fatigued to attempt after walking today. Will reassess next visit. 10/18/2022=21  Goal status: ONGOING Target Date: 01/24/2023           Plan -     Clinical Impression Statement Patient came presents with good motivation for completion of physical therapy activities.  Physical therapist continues with plan of care over previous sessions with focus on functional transfer ability and lower extremity strengthening.  Patient does show continued difficulty with transfers particularly with sequencing and transfers without upper  extremity assist.  Patient has performed answers safely but they do require increased time as well as with min assist from therapist for steadying support.Pt will continue to benefit from skilled physical therapy intervention to address impairments, improve QOL, and attain therapy goals.     Personal Factors and Comorbidities Age;Comorbidity 1;Comorbidity 2;Comorbidity 3+;Time since onset of injury/illness/exacerbation;Other    Comorbidities AFib, Renal Artery thrombosis, situational depression, HTN, Pyelnephritis, Seizure activity, COnstipation, AKI    Examination-Activity Limitations Bathing;Bed Mobility;Carry;Locomotion Level;Dressing;Sit;Squat;Stairs;Stand;Toileting;Transfers    Examination-Participation Restrictions Church;Community Activity;Laundry    Stability/Clinical Decision Making Evolving/Moderate complexity    Rehab Potential Fair    PT Frequency 1-2x / week    PT Duration 12 weeks    PT Treatment/Interventions ADLs/Self Care Home Management;Gait training;Stair training;Therapeutic activities;Therapeutic exercise;Balance training;Neuromuscular re-education;Patient/family education;Wheelchair mobility training;Manual techniques;Energy conservation;Dry needling;Passive range of motion;Taping;Spinal Manipulations;Joint Manipulations    PT Next Visit Plan Gait training, Transfer training, LE strengthening as appropriate.    PT Home Exercise Plan HEP: general LE and postural exercises    Consulted and Agree with Plan of Care Patient            12:09 PM, 11/15/22 ChParticia LatherT ,DPT Physical Therapist- [facility]  11/15/22, 12:09 PM

## 2022-11-17 ENCOUNTER — Ambulatory Visit: Payer: Medicare Other | Admitting: Physical Therapy

## 2022-11-17 ENCOUNTER — Encounter: Payer: Medicare Other | Admitting: Occupational Therapy

## 2022-11-17 ENCOUNTER — Ambulatory Visit: Payer: Medicare Other

## 2022-11-17 ENCOUNTER — Encounter: Payer: Medicare Other | Admitting: Speech Pathology

## 2022-11-17 DIAGNOSIS — R269 Unspecified abnormalities of gait and mobility: Secondary | ICD-10-CM

## 2022-11-17 DIAGNOSIS — M6281 Muscle weakness (generalized): Secondary | ICD-10-CM

## 2022-11-17 DIAGNOSIS — I63512 Cerebral infarction due to unspecified occlusion or stenosis of left middle cerebral artery: Secondary | ICD-10-CM

## 2022-11-17 DIAGNOSIS — R262 Difficulty in walking, not elsewhere classified: Secondary | ICD-10-CM

## 2022-11-17 DIAGNOSIS — R278 Other lack of coordination: Secondary | ICD-10-CM | POA: Diagnosis not present

## 2022-11-17 DIAGNOSIS — R2681 Unsteadiness on feet: Secondary | ICD-10-CM

## 2022-11-17 NOTE — Therapy (Signed)
OUTPATIENT PHYSICAL THERAPY TREATMENT NOTE  Patient Name: Lindsey [name] MRN: [id number] DOB:20-Mar-1956, 67 y.o., female Today's Date: 11/18/2022  PCP: Dr. Tomasa Hose MD REFERRING PROVIDER: Leeroy Cha MD   PT End of Session - 11/17/22 1058     Visit Number 68    Number of Visits 69    Date for PT Re-Evaluation 01/24/23    Authorization Type Medicare    Authorization Time Period Recert [id number]    Progress Note Due on Visit 80    PT Start Time 1100    PT Stop Time 1142    PT Time Calculation (min) 42 min    Equipment Utilized During Treatment Gait belt    Activity Tolerance Patient tolerated treatment well;No increased pain    Behavior During Therapy WFL for tasks assessed/performed                               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 No pertinent surgical history. 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 ([place]) 09/24/2021   Cerebral embolism with cerebral infarction 09/23/2021   Pyelonephritis 09/19/2021   Pain due to onychomycosis of toenails of both feet 11/19/2020   Normocytic anemia 05/31/2020   Acute ischemic left MCA stroke ([place]) 04/30/2020   Right hemiplegia ([place]) 04/30/2020   Cerebrovascular accident ([place]) 04/21/2020    Atrial fibrillation with RVR ([place]) 04/21/2020   Essential hypertension 04/21/2020   Alcohol abuse 04/21/2020    REFERRING DIAG: s/p CVA  THERAPY DIAG:  Abnormality of gait and mobility  Difficulty in walking, not elsewhere classified  Muscle weakness (generalized)  Unsteadiness on feet  Other lack of coordination  Acute ischemic left MCA stroke [facility])  Rationale for Evaluation and Treatment Rehabilitation  PERTINENT HISTORY: PTA was a short distance AMB c SPC prior to recent stroke in December 2022. Pt since using WC for primary mobility. Pt has hesitation with LLE, significantly weak RLE. Caregiver assists with all transfers. Pt has 2 level home, chair lift for 2nd level. Pt would like to improve transfers, improve Rt neglect, and progress general strength. 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 Primary impairment includes Rt hemiplegia and global aphasia. H/o prior CVA c Rt hemiplegia July 2021. PMH: AF, Renal Artery thrombosis, HTN, pyelonephritis, seizure activity, constipation, AKI, Vitamin D deficiency, and urinary incontinence.    PRECAUTIONS: fall risk, wearing RLE AFO  SUBJECTIVE:   Patient states no issues since last visit. Denies any falls  PAIN:  Are you having pain? No   TODAY'S TREATMENT:  11/18/22  INTERVENTIONS:  Stand pivot transfer to Nustep from transport chair with min assist +1.   Therex:   Nustep  with use of Leg attachment to keep right LE from abducting x 5 min then rest for 2 min- Patient spend first 3 min using left UE as she was unable to facilitate RLE press yet after 3 min she spontaneously was able to use RLE and began propelling back and forth with just LE's  Patient completed 10 min today with 1 seated rest break after 5 min. O2 sat and HR after 5 min= 97%; HR=82 bpm and VC to keep SPM > 30   Seated AAROM Right hip march 1x12 reps Seated AROM left hip march 1x12 reps Seated AROM knee ext - 1x12 bilateral  LE Seated AROM- Right ham curl x 12 reps   TA .  STS from straight back chair with armrest, Left HW, Rt AFO, PT cues for adequate weight shift to R side for improved balance. Copmlete 10 reps with 30 second prolonged standing. In Standing- instructed to weight shift to right LE -  taking small march step with LLE - Difficulty with sequencing of this task - improved with max VC  Stand pivot transfer to the right ( increased time and difficulty) with min to mod A from PT, from standard chair to transport chair    PATIENT EDUCATION: Education details: Pt educated throughout session about proper posture and technique with exercises. Improved exercise technique, movement at target joints, use of target muscles after min to mod verbal, visual, tactile cues. Continued education on technique with STS.  Person educated: Patient Education method: Explanation, Demonstration, and Verbal cues Education comprehension: verbalized understanding, returned demonstration, and needs further education   HOME EXERCISE PROGRAM: Continue as previously given;    PT Short Term Goals -       PT SHORT TERM GOAL #1   Title Patient will be independent in home exercise program to improve strength/mobility for better functional independence with ADLs.    Baseline no formal HEP for LE/balance at this time; 02/08/2022=Patient continues to require VC for reminders and assist with hip march/knee ext (seated for strengthening); Reminders to continue to work on static stand at home.    Time 4    Period Weeks    Status Partially Met  04/07/22: complete somewhat regularly     Target Date 12/14/21              PT Long Term Goals -       PT LONG TERM GOAL #1   Title Patient will increase FOTO score to equal to or greater than   41  to demonstrate statistically significant improvement in mobility and quality of life.    Baseline 16 on 2/14; 02/08/2022=Patient unable to complete today- No family present but will  attempt again next visit available with family present to assist in completion; 02/17/2022= 40; 06/28/2022- No caregiver present in PT session to assist in answering questions. 08/04/2022= 40; 11/01/2022= unable to complete- caregiver received a call and not in room to assist patient today.    Time 12    Period Weeks    Status On-going    Target Date 01/24/2023       PT LONG TERM GOAL #3   Title Patient  will complete five times sit to stand test in < 30 seconds with UE assistance indicating an increased LE strength and improved balance.    Baseline 58.47 s with UE assist and with A from PT for R LE positioning; 02/08/2022=54 sec with use of L UE support and Min physical assist to position right  LE.  7/6: 30.5 sec, min A for LE positioning, some attempts did not come to full erect posture, did not let go with L UE from armchair with any attempt 05/05/22: 26.65 sec   Time 12    Period Weeks    Status Met    Target Date 05/03/22      PT LONG TERM GOAL #4   Title Patient will increase Berg Balance score by > 6 points to demonstrate decreased fall risk during functional activities.    Baseline 12 on 2/14; 02/08/2022= 13/56  04/07/22: 17  8/3: 19    Time 12    Period Weeks    Status Met    Target Date 05/03/22      PT LONG TERM GOAL #5   Title Pt will transfer from seated position on table/chair to/from transport chair/WC in order to indicate improved transfer ability.    Baseline Requires Min A for LE positioning and to prevent LOB. 02/08/2022=Patient attempted to perform on her own- Unable to push herself up from chair- Very min assist to position right LE into more flexed position and VC for hand placement. Patient able to stand with min Assist with use of gait belt and VC to lean; 02/17/2022=Patient was able to transfer from transport w/c with some physical assist with Right to swing to pivot to side of chair. She was able to sit to stand today with only CGA and then stand pivot with right arm holding  onto arm of chair. Only difficulty was stand to sit- Max VC not to plop into chair. 04/07/22: Pt performed movement of B LE to side of chair independently with chair transfer. Pt required min A with R LE foot placement of floor. Pt requires increased time to complete but complete with CGA only for safety and no cues or A to prevent falling/ plopping into armchair.  8/3: Pt requires min A for maintenance of standig balance, able to navigate from transport chairr to arm chair with hemiwalker, does not controll descent with ahands still " flops" into chair; 08/04/2022- Patient continues to be inconsistent in her transfers- at times only CGA and other more min Assist and repetitive VC. Unclear due to some ongoing cognitive issues if patient will achieve this goal but still presents with potential. 09/08/2022- Patient able to stand with CGA yet continues to require initial VC for proper sequencing.   Time 12    Period Weeks    Status Partially met    Target Date 10/27/2022       PT LONG TERM GOAL #6   Title Patient will ambulate > 150 feet with CGA using Hemiwalker on level surfaces for improved household and short community distances.    Baseline 02/08/2022= 40 feet with use of HW, heavy CGA with max VC for gait sequencing and w/c follow. 5/18 = 85 feet with use of HW, close CGA  7/6: 45 feet, heavy use of hemiwalker 8/3: 85 feet with heavy hemi walker use, no Lob throughout, cues for appropriate weight distribution; 06/28/2022 - 205 feet with use of hemiwalker, CGA; 08/04/2022- Patient presents with inconsistent gait ability - able to walk 150 feet with max effort and increased time using hemiwalker and CGA. 10/18/2022= Patient ambulated around 45 feet at end of session and complained of being too fatigued to continue- very slow deliberate steps- difficulty advancing right LE; 11/01/2022- Patient ambulated approx 50 feet total today- using hemiwalker with step to gait- Mod VC for gait sequencing and close w/c  follow- Decreased step height/length on affected right side.    Time 12    Period Weeks    Status On-going    Target Date 01/24/2023        PT LONG TERM GOAL #7   Title Pt will increase 10MWT by at least 0.13 m/s in order to demonstrate clinically significant improvement in community ambulation.    Baseline 02/08/2022= 0.05 m/s using HW 7/6: .05ms with heavy ue use of hemiwalker and CGA for balance 8/3: .0534m 06/28/2022= 0.1 m/s (will keep goal active to ensure she is consistent); 08/04/2022- 0.09 m/s); 10/18/2021= 0.07 m/s using hemiwalker; 11/01/2022= 0.07 m/s   Time 12    Period Weeks    Status Ongoing     Target Date 01/24/2023      8.  Pt will improve 5X STS with full knee extension in standing each rep and with appropriate eccentric control in order to indicate improved transfer efficacy and improved LE strength  Baseline: 52.45 sec with UE support; 06/28/2022= 36.03 sec with UE support (VC for erect standing- patient continues to press up using back of legs against chair to stand); 08/04/2022- Patient required 45 sec using left UE and max VC (flopping at times with sitting); 10/18/2022 = 48 sec with Left UE and continued VC to lean forward and stand erect without falling back into chair- some improved eccentric control with last 2 reps. 11/01/2022= 39.7 sec with LE UE and CGA with VC to lean forward.  Goal status: ONGOING TARGET date: 01/24/2023  9.  Patient will increase Berg Balance score by > 6 points to demonstrate decreased fall risk during functional activities Baseline: 19; 06/28/2022= 19/56; 08/04/2022- Deferred secondary to patient too fatigued to attempt after walking today. Will reassess next visit. 10/18/2022=21  Goal status: ONGOING Target Date: 01/24/2023           Plan -     Clinical Impression Statement Treatment continue per plan of care with focus on functional transfers and LE strengthening.  Patient continues to be limited by impaired cognition requiring repetitive  VC and some Physical assist to safely perform transfers. She performed well with Nustep and attaching to keep right LE adducted and was then able to negotiate Nustep with just LE's and able to press with right LE facilitating more quad activation. She was very fatigued with some difficulty with static standing most likely due to quad fatigue from performing on Nustep earlier in session. Pt will continue to benefit from skilled physical therapy intervention to address impairments, improve QOL, and attain therapy goals.     Personal Factors and Comorbidities Age;Comorbidity 1;Comorbidity 2;Comorbidity 3+;Time since onset of injury/illness/exacerbation;Other    Comorbidities AFib, Renal Artery thrombosis, situational depression, HTN, Pyelnephritis, Seizure activity, COnstipation, AKI    Examination-Activity Limitations Bathing;Bed Mobility;Carry;Locomotion Level;Dressing;Sit;Squat;Stairs;Stand;Toileting;Transfers    Examination-Participation Restrictions Church;Community Activity;Laundry    Stability/Clinical Decision Making Evolving/Moderate complexity    Rehab Potential Fair    PT Frequency 1-2x / week    PT Duration 12 weeks    PT Treatment/Interventions ADLs/Self Care Home Management;Gait training;Stair training;Therapeutic activities;Therapeutic exercise;Balance training;Neuromuscular re-education;Patient/family education;Wheelchair mobility training;Manual techniques;Energy conservation;Dry needling;Passive range of motion;Taping;Spinal Manipulations;Joint Manipulations    PT Next Visit Plan Gait training, Transfer training, LE strengthening as appropriate.    PT Home Exercise Plan HEP: general LE and postural exercises    Consulted and Agree with Plan of Care Patient            9:08 AM, 11/18/22 JeLewis MoccasinT  Physical  Therapist- [facility]    11/18/22, 9:08 AM

## 2022-11-22 ENCOUNTER — Ambulatory Visit: Payer: Medicare Other | Admitting: Physical Therapy

## 2022-11-22 ENCOUNTER — Ambulatory Visit: Payer: Medicare Other

## 2022-11-22 ENCOUNTER — Encounter: Payer: Medicare Other | Admitting: Occupational Therapy

## 2022-11-22 ENCOUNTER — Ambulatory Visit: Payer: Medicare Other | Admitting: Occupational Therapy

## 2022-11-22 ENCOUNTER — Encounter: Payer: Medicare Other | Admitting: Speech Pathology

## 2022-11-22 DIAGNOSIS — R278 Other lack of coordination: Secondary | ICD-10-CM

## 2022-11-22 DIAGNOSIS — I63512 Cerebral infarction due to unspecified occlusion or stenosis of left middle cerebral artery: Secondary | ICD-10-CM

## 2022-11-22 DIAGNOSIS — R269 Unspecified abnormalities of gait and mobility: Secondary | ICD-10-CM

## 2022-11-22 DIAGNOSIS — M6281 Muscle weakness (generalized): Secondary | ICD-10-CM

## 2022-11-22 DIAGNOSIS — R2681 Unsteadiness on feet: Secondary | ICD-10-CM

## 2022-11-22 DIAGNOSIS — R262 Difficulty in walking, not elsewhere classified: Secondary | ICD-10-CM

## 2022-11-22 NOTE — Therapy (Signed)
OUTPATIENT PHYSICAL THERAPY TREATMENT NOTE  Patient Name: Lindsey [name] MRN: [id number] DOB:1956/08/21, 67 y.o., female Today's Date: 11/23/2022  PCP: Dr. Tomasa Hose MD REFERRING PROVIDER: Leeroy Cha MD   PT End of Session - 11/22/22 1058     Visit Number 10    Number of Visits 72    Date for PT Re-Evaluation 01/24/23    Authorization Type Medicare    Authorization Time Period Recert [id number]    Progress Note Due on Visit 80    PT Start Time 1058    PT Stop Time 1141    PT Time Calculation (min) 43 min    Equipment Utilized During Treatment Gait belt    Activity Tolerance Patient tolerated treatment well;No increased pain    Behavior During Therapy WFL for tasks assessed/performed                               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 No pertinent surgical history. 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 ([place]) 09/24/2021   Cerebral embolism with cerebral infarction 09/23/2021   Pyelonephritis 09/19/2021   Pain due to onychomycosis of toenails of both feet 11/19/2020   Normocytic anemia 05/31/2020   Acute ischemic left MCA stroke ([place]) 04/30/2020   Right hemiplegia ([place]) 04/30/2020   Cerebrovascular accident ([place]) 04/21/2020    Atrial fibrillation with RVR ([place]) 04/21/2020   Essential hypertension 04/21/2020   Alcohol abuse 04/21/2020    REFERRING DIAG: s/p CVA  THERAPY DIAG:  Abnormality of gait and mobility  Difficulty in walking, not elsewhere classified  Muscle weakness (generalized)  Unsteadiness on feet  Other lack of coordination  Acute ischemic left MCA stroke [facility])  Rationale for Evaluation and Treatment Rehabilitation  PERTINENT HISTORY: PTA was a short distance AMB c SPC prior to recent stroke in December 2022. Pt since using WC for primary mobility. Pt has hesitation with LLE, significantly weak RLE. Caregiver assists with all transfers. Pt has 2 level home, chair lift for 2nd level. Pt would like to improve transfers, improve Rt neglect, and progress general strength. 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 Primary impairment includes Rt hemiplegia and global aphasia. H/o prior CVA c Rt hemiplegia July 2021. PMH: AF, Renal Artery thrombosis, HTN, pyelonephritis, seizure activity, constipation, AKI, Vitamin D deficiency, and urinary incontinence.    PRECAUTIONS: fall risk, wearing RLE AFO  SUBJECTIVE:   Patient states no issues since last visit. Denies any falls  PAIN:  Are you having pain? No   TODAY'S TREATMENT:  11/23/22  INTERVENTIONS:   Therex:    Seated AAROM Right hip march 2x15 reps Seated AROM left hip march 2x15  reps Seated AROM knee ext - 2x12 bilateral LE Seated AROM- Right ham curl x 12 reps   TA .  STS from straight back chair with armrest, Left HW, Rt AFO, PT cues for adequate weight shift to R side for improved balance. Complete 10 reps.  In Standing- instructed to weight shift to right LE -  taking small march step with LLE - Difficulty with sequencing of this task - improved with max VC  Stand pivot transfer to the right ( increased time and difficulty) with min to mod A from PT, from standard chair to transport chair    Standing at support bar without UE support x 60 sec x 3   PATIENT EDUCATION: Education details: Pt educated throughout session about proper posture and technique with exercises. Improved exercise technique, movement at target joints, use of target muscles after min to mod verbal, visual, tactile cues. Continued education on technique with STS.  Person educated: Patient Education method: Explanation, Demonstration, and Verbal cues Education comprehension: verbalized understanding, returned demonstration, and needs further education   HOME EXERCISE PROGRAM: Continue as previously given;    PT Short Term Goals -       PT SHORT TERM GOAL #1   Title Patient will be independent in home exercise program to improve strength/mobility for better functional independence with ADLs.    Baseline no formal HEP for LE/balance at this time; 02/08/2022=Patient continues to require VC for reminders and assist with hip march/knee ext (seated for strengthening); Reminders to continue to work on static stand at home.    Time 4    Period Weeks    Status Partially Met  04/07/22: complete somewhat regularly     Target Date 12/14/21              PT Long Term Goals -       PT LONG TERM GOAL #1   Title Patient will increase FOTO score to equal to or greater than   41  to demonstrate statistically significant improvement in mobility and quality of life.    Baseline 16 on 2/14; 02/08/2022=Patient unable to complete today- No family present but will attempt again next visit available with family present to assist in completion; 02/17/2022= 40; 06/28/2022- No caregiver present in PT session to assist in answering questions. 08/04/2022= 40; 11/01/2022= unable to complete- caregiver received a call and not in room to assist patient today.    Time 12    Period Weeks    Status On-going    Target Date 01/24/2023       PT LONG TERM GOAL #3   Title Patient  will complete five times sit to stand test in < 30  seconds with UE assistance indicating an increased LE strength and improved balance.    Baseline 58.47 s with UE assist and with A from PT for R LE positioning; 02/08/2022=54 sec with use of L UE support and Min physical assist to position right LE.  7/6: 30.5 sec, min A for LE positioning, some attempts did not come to full erect posture, did not let go with L UE from armchair with any attempt 05/05/22: 26.65 sec   Time 12    Period Weeks    Status Met    Target Date 05/03/22      PT LONG TERM GOAL #4   Title Patient will increase Berg Balance score by > 6 points to demonstrate decreased fall risk during functional activities.    Baseline 12 on 2/14;  02/08/2022= 13/56  04/07/22: 17  8/3: 19    Time 12    Period Weeks    Status Met    Target Date 05/03/22      PT LONG TERM GOAL #5   Title Pt will transfer from seated position on table/chair to/from transport chair/WC in order to indicate improved transfer ability.    Baseline Requires Min A for LE positioning and to prevent LOB. 02/08/2022=Patient attempted to perform on her own- Unable to push herself up from chair- Very min assist to position right LE into more flexed position and VC for hand placement. Patient able to stand with min Assist with use of gait belt and VC to lean; 02/17/2022=Patient was able to transfer from transport w/c with some physical assist with Right to swing to pivot to side of chair. She was able to sit to stand today with only CGA and then stand pivot with right arm holding onto arm of chair. Only difficulty was stand to sit- Max VC not to plop into chair. 04/07/22: Pt performed movement of B LE to side of chair independently with chair transfer. Pt required min A with R LE foot placement of floor. Pt requires increased time to complete but complete with CGA only for safety and no cues or A to prevent falling/ plopping into armchair.  8/3: Pt requires min A for maintenance of standig balance, able to navigate from transport  chairr to arm chair with hemiwalker, does not controll descent with ahands still " flops" into chair; 08/04/2022- Patient continues to be inconsistent in her transfers- at times only CGA and other more min Assist and repetitive VC. Unclear due to some ongoing cognitive issues if patient will achieve this goal but still presents with potential. 09/08/2022- Patient able to stand with CGA yet continues to require initial VC for proper sequencing.   Time 12    Period Weeks    Status Partially met    Target Date 10/27/2022       PT LONG TERM GOAL #6   Title Patient will ambulate > 150 feet with CGA using Hemiwalker on level surfaces for improved household and short community distances.    Baseline 02/08/2022= 40 feet with use of HW, heavy CGA with max VC for gait sequencing and w/c follow. 5/18 = 85 feet with use of HW, close CGA  7/6: 45 feet, heavy use of hemiwalker 8/3: 85 feet with heavy hemi walker use, no Lob throughout, cues for appropriate weight distribution; 06/28/2022 - 205 feet with use of hemiwalker, CGA; 08/04/2022- Patient presents with inconsistent gait ability - able to walk 150 feet with max effort and increased time using hemiwalker and CGA. 10/18/2022= Patient ambulated around 45 feet at end of session and complained of being too fatigued to continue- very slow deliberate steps- difficulty advancing right LE; 11/01/2022- Patient ambulated approx 50 feet total today- using hemiwalker with step to gait- Mod VC for gait sequencing and close w/c follow- Decreased step height/length on affected right side.    Time 12    Period Weeks    Status On-going    Target Date 01/24/2023        PT LONG TERM GOAL #7   Title Pt will increase 10MWT by at least 0.13 m/s in order to demonstrate clinically significant improvement in community ambulation.    Baseline 02/08/2022= 0.05 m/s using HW 7/6: .064ms with heavy ue use of hemiwalker and CGA for balance 8/3: .0546m 06/28/2022= 0.1 m/s (will keep goal  active  to ensure she is consistent); 08/04/2022- 0.09 m/s); 10/18/2021= 0.07 m/s using hemiwalker; 11/01/2022= 0.07 m/s   Time 12    Period Weeks    Status Ongoing     Target Date 01/24/2023      8.  Pt will improve 5X STS with full knee extension in standing each rep and with appropriate eccentric control in order to indicate improved transfer efficacy and improved LE strength  Baseline: 52.45 sec with UE support; 06/28/2022= 36.03 sec with UE support (VC for erect standing- patient continues to press up using back of legs against chair to stand); 08/04/2022- Patient required 45 sec using left UE and max VC (flopping at times with sitting); 10/18/2022 = 48 sec with Left UE and continued VC to lean forward and stand erect without falling back into chair- some improved eccentric control with last 2 reps. 11/01/2022= 39.7 sec with LE UE and CGA with VC to lean forward.  Goal status: ONGOING TARGET date: 01/24/2023  9.  Patient will increase Berg Balance score by > 6 points to demonstrate decreased fall risk during functional activities Baseline: 19; 06/28/2022= 19/56; 08/04/2022- Deferred secondary to patient too fatigued to attempt after walking today. Will reassess next visit. 10/18/2022=21  Goal status: ONGOING Target Date: 01/24/2023           Plan -     Clinical Impression Statement Patient continues to require increased/repetitive VC yet less overall physical assist- only CGA with several reps with standing. She was able to demo progress with standing overall today- able to stand without UE Support and no LOB - able to facilitate more equal weight bearing- did exhibit some right LE knee hyperextension yet no buckling today. Pt will continue to benefit from skilled physical therapy intervention to address impairments, improve QOL, and attain therapy goals.     Personal Factors and Comorbidities Age;Comorbidity 1;Comorbidity 2;Comorbidity 3+;Time since onset of injury/illness/exacerbation;Other     Comorbidities AFib, Renal Artery thrombosis, situational depression, HTN, Pyelnephritis, Seizure activity, COnstipation, AKI    Examination-Activity Limitations Bathing;Bed Mobility;Carry;Locomotion Level;Dressing;Sit;Squat;Stairs;Stand;Toileting;Transfers    Examination-Participation Restrictions Church;Community Activity;Laundry    Stability/Clinical Decision Making Evolving/Moderate complexity    Rehab Potential Fair    PT Frequency 1-2x / week    PT Duration 12 weeks    PT Treatment/Interventions ADLs/Self Care Home Management;Gait training;Stair training;Therapeutic activities;Therapeutic exercise;Balance training;Neuromuscular re-education;Patient/family education;Wheelchair mobility training;Manual techniques;Energy conservation;Dry needling;Passive range of motion;Taping;Spinal Manipulations;Joint Manipulations    PT Next Visit Plan Gait training, Transfer training, LE strengthening as appropriate.    PT Home Exercise Plan HEP: general LE and postural exercises    Consulted and Agree with Plan of Care Patient            2:53 PM, 11/23/22 Lewis Moccasin PT  Physical Therapist- [facility]    11/23/22, 2:53 PM

## 2022-11-24 ENCOUNTER — Encounter: Payer: Medicare Other | Admitting: Speech Pathology

## 2022-11-24 ENCOUNTER — Encounter: Payer: Medicare Other | Admitting: Occupational Therapy

## 2022-11-24 ENCOUNTER — Ambulatory Visit: Payer: Medicare Other

## 2022-11-24 ENCOUNTER — Ambulatory Visit: Payer: Medicare Other | Admitting: Physical Therapy

## 2022-11-24 DIAGNOSIS — R278 Other lack of coordination: Secondary | ICD-10-CM

## 2022-11-24 DIAGNOSIS — M6281 Muscle weakness (generalized): Secondary | ICD-10-CM

## 2022-11-24 DIAGNOSIS — R262 Difficulty in walking, not elsewhere classified: Secondary | ICD-10-CM

## 2022-11-24 DIAGNOSIS — R269 Unspecified abnormalities of gait and mobility: Secondary | ICD-10-CM

## 2022-11-24 DIAGNOSIS — R2689 Other abnormalities of gait and mobility: Secondary | ICD-10-CM

## 2022-11-24 DIAGNOSIS — R2681 Unsteadiness on feet: Secondary | ICD-10-CM

## 2022-11-24 DIAGNOSIS — I63542 Cerebral infarction due to unspecified occlusion or stenosis of left cerebellar artery: Secondary | ICD-10-CM

## 2022-11-24 NOTE — Therapy (Signed)
OUTPATIENT PHYSICAL THERAPY TREATMENT NOTE  Patient Name: Marlon Over MRN: [id number] DOB:[birth year], 67 y.o., female Today's Date: 11/24/2022  PCP: Dr. Tomasa Hose MD REFERRING PROVIDER: Leeroy Cha MD   PT End of Session - 11/24/22 1059     Visit Number 74    Number of Visits 62    Date for PT Re-Evaluation 01/24/23    Authorization Type Medicare    Authorization Time Period Recert [id number]    Progress Note Due on Visit 80    PT Start Time 1059    PT Stop Time 1140    PT Time Calculation (min) 41 min    Equipment Utilized During Treatment Gait belt    Activity Tolerance Patient tolerated treatment well;No increased pain    Behavior During Therapy WFL for tasks assessed/performed                               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 No pertinent surgical history. 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 ([place]) 09/24/2021   Cerebral embolism with cerebral infarction 09/23/2021   Pyelonephritis 09/19/2021   Pain due to onychomycosis of toenails of both feet 11/19/2020   Normocytic anemia 05/31/2020   Acute ischemic left MCA stroke ([place]) 04/30/2020   Right hemiplegia ([place]) 04/30/2020   Cerebrovascular accident ([place]) 04/21/2020    Atrial fibrillation with RVR ([place]) 04/21/2020   Essential hypertension 04/21/2020   Alcohol abuse 04/21/2020    REFERRING DIAG: s/p CVA  THERAPY DIAG:  Abnormality of gait and mobility  Difficulty in walking, not elsewhere classified  Muscle weakness (generalized)  Unsteadiness on feet  Other lack of coordination  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 Pt since using WC for primary mobility. Pt has hesitation with LLE, significantly weak RLE. Caregiver assists with all transfers. Pt has 2 level home, chair lift for 2nd level. Pt would like to improve transfers, improve Rt neglect, and progress general strength. 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 Primary impairment includes Rt hemiplegia and global aphasia. H/o prior CVA c Rt hemiplegia July 2021. PMH: AF, Renal Artery thrombosis, HTN, pyelonephritis, seizure activity, constipation, AKI, Vitamin D deficiency, and urinary incontinence.    PRECAUTIONS: fall risk, wearing RLE AFO  SUBJECTIVE:   Patient states feeling pretty good today. States fatigued after last session.    PAIN:  Are you having pain? No   TODAY'S TREATMENT:  11/24/22  INTERVENTIONS:  Therex:  PROM to BLE with Knee ext/Flex x 30 sec x 3 each LE AROM to right knee flex/ext x 20 reps   TA: .  STS from straight back chair with armrest, Left HW, Rt AFO, PT cues for adequate weight shift to R side for improved balance. Complete 10 reps.  Gait training:   Patient ambulated approx 20 feet x 2 trials, left Hemiwalker,  CGA, gait belt and close w/c follow.Exhibiting very short step to gait with increased VC for step sequencing.  Patient rested x 3 min then  Patient ambulated  another 40 feet with use of hemiwalker- upon completion patient reported exhausted   PATIENT EDUCATION: Education details: Pt educated throughout session about proper posture and technique with exercises. Improved exercise technique, movement at target joints, use of target muscles after min to mod verbal, visual, tactile cues. Continued education on technique with STS.  Person educated: Patient Education method: Explanation, Demonstration, and Verbal cues Education comprehension: verbalized understanding, returned demonstration, and needs further education   HOME EXERCISE PROGRAM: Continue as previously given;    PT Short Term Goals -       PT SHORT TERM GOAL #1   Title Patient will be independent in home exercise program to improve strength/mobility for better functional independence with ADLs.    Baseline no formal HEP for LE/balance at this time; 02/08/2022=Patient continues to require VC for reminders and assist with hip march/knee ext (seated for strengthening); Reminders to continue to work on static stand at home.    Time 4    Period Weeks    Status Partially Met  04/07/22: complete somewhat regularly     Target Date 12/14/21              PT Long Term Goals -       PT LONG TERM GOAL #1   Title Patient will increase FOTO score to equal to or greater than   41  to demonstrate statistically significant improvement in mobility and quality of life.    Baseline 16 on 2/14; 02/08/2022=Patient unable to complete today- No family present but will attempt again next visit available with family present to assist in completion; 02/17/2022= 40; 06/28/2022- No caregiver present in PT session to assist in answering questions. 08/04/2022= 40; 11/01/2022= unable to complete- caregiver received a call and not in room to assist patient today.    Time 12    Period Weeks    Status On-going    Target Date 01/24/2023       PT LONG TERM GOAL #3   Title Patient  will complete five times sit to  stand test in < 30 seconds with UE assistance indicating an increased LE strength and improved balance.    Baseline 58.47 s with UE assist and with A from PT for R LE positioning; 02/08/2022=54 sec with use of L UE support and Min physical assist to position right LE.  7/6: 30.5 sec, min A for LE positioning, some attempts did not come to full erect posture, did not let go with L UE from armchair with any attempt 05/05/22: 26.65 sec   Time 12    Period Weeks    Status Met    Target Date 05/03/22      PT LONG TERM GOAL #4   Title Patient will increase Berg Balance score by > 6 points to demonstrate decreased fall risk during functional activities.    Baseline 12 on 2/14; 02/08/2022= 13/56  04/07/22: 17  8/3:  19    Time 12    Period Weeks    Status Met    Target Date 05/03/22      PT LONG TERM GOAL #5   Title Pt will transfer from seated position on table/chair to/from transport chair/WC in order to indicate improved transfer ability.    Baseline Requires Min A for LE positioning and to prevent LOB. 02/08/2022=Patient attempted to perform on her own- Unable to push herself up from chair- Very min assist to position right LE into more flexed position and VC for hand placement. Patient able to stand with min Assist with use of gait belt and VC to lean; 02/17/2022=Patient was able to transfer from transport w/c with some physical assist with Right to swing to pivot to side of chair. She was able to sit to stand today with only CGA and then stand pivot with right arm holding onto arm of chair. Only difficulty was stand to sit- Max VC not to plop into chair. 04/07/22: Pt performed movement of B LE to side of chair independently with chair transfer. Pt required min A with R LE foot placement of floor. Pt requires increased time to complete but complete with CGA only for safety and no cues or A to prevent falling/ plopping into armchair.  8/3: Pt requires min A for maintenance of standig balance, able to navigate  from transport chairr to arm chair with hemiwalker, does not controll descent with ahands still " flops" into chair; 08/04/2022- Patient continues to be inconsistent in her transfers- at times only CGA and other more min Assist and repetitive VC. Unclear due to some ongoing cognitive issues if patient will achieve this goal but still presents with potential. 09/08/2022- Patient able to stand with CGA yet continues to require initial VC for proper sequencing.   Time 12    Period Weeks    Status Partially met    Target Date 10/27/2022       PT LONG TERM GOAL #6   Title Patient will ambulate > 150 feet with CGA using Hemiwalker on level surfaces for improved household and short community distances.    Baseline 02/08/2022= 40 feet with use of HW, heavy CGA with max VC for gait sequencing and w/c follow. 5/18 = 85 feet with use of HW, close CGA  7/6: 45 feet, heavy use of hemiwalker 8/3: 85 feet with heavy hemi walker use, no Lob throughout, cues for appropriate weight distribution; 06/28/2022 - 205 feet with use of hemiwalker, CGA; 08/04/2022- Patient presents with inconsistent gait ability - able to walk 150 feet with max effort and increased time using hemiwalker and CGA. 10/18/2022= Patient ambulated around 45 feet at end of session and complained of being too fatigued to continue- very slow deliberate steps- difficulty advancing right LE; 11/01/2022- Patient ambulated approx 50 feet total today- using hemiwalker with step to gait- Mod VC for gait sequencing and close w/c follow- Decreased step height/length on affected right side.    Time 12    Period Weeks    Status On-going    Target Date 01/24/2023        PT LONG TERM GOAL #7   Title Pt will increase 10MWT by at least 0.13 m/s in order to demonstrate clinically significant improvement in community ambulation.    Baseline 02/08/2022= 0.05 m/s using HW 7/6: .032ms with heavy ue use of hemiwalker and CGA for balance 8/3: .0543m 06/28/2022= 0.1 m/s (will  keep goal active to ensure she is consistent);  08/04/2022- 0.09 m/s); 10/18/2021= 0.07 m/s using hemiwalker; 11/01/2022= 0.07 m/s   Time 12    Period Weeks    Status Ongoing     Target Date 01/24/2023      8.  Pt will improve 5X STS with full knee extension in standing each rep and with appropriate eccentric control in order to indicate improved transfer efficacy and improved LE strength  Baseline: 52.45 sec with UE support; 06/28/2022= 36.03 sec with UE support (VC for erect standing- patient continues to press up using back of legs against chair to stand); 08/04/2022- Patient required 45 sec using left UE and max VC (flopping at times with sitting); 10/18/2022 = 48 sec with Left UE and continued VC to lean forward and stand erect without falling back into chair- some improved eccentric control with last 2 reps. 11/01/2022= 39.7 sec with LE UE and CGA with VC to lean forward.  Goal status: ONGOING TARGET date: 01/24/2023  9.  Patient will increase Berg Balance score by > 6 points to demonstrate decreased fall risk during functional activities Baseline: 19; 06/28/2022= 19/56; 08/04/2022- Deferred secondary to patient too fatigued to attempt after walking today. Will reassess next visit. 10/18/2022=21  Goal status: ONGOING Target Date: 01/24/2023           Plan -     Clinical Impression Statement Treatment focused today more on walking and patient struggled but was able to progress with gait distance today. She was receptive to [facility] today to weight shift yet really struggled advancing right LE forward.  Pt will continue to benefit from skilled physical therapy intervention to address impairments, improve QOL, and attain therapy goals.     Personal Factors and Comorbidities Age;Comorbidity 1;Comorbidity 2;Comorbidity 3+;Time since onset of injury/illness/exacerbation;Other    Comorbidities AFib, Renal Artery thrombosis, situational depression, HTN, Pyelnephritis, Seizure activity, COnstipation, AKI     Examination-Activity Limitations Bathing;Bed Mobility;Carry;Locomotion Level;Dressing;Sit;Squat;Stairs;Stand;Toileting;Transfers    Examination-Participation Restrictions Church;Community Activity;Laundry    Stability/Clinical Decision Making Evolving/Moderate complexity    Rehab Potential Fair    PT Frequency 1-2x / week    PT Duration 12 weeks    PT Treatment/Interventions ADLs/Self Care Home Management;Gait training;Stair training;Therapeutic activities;Therapeutic exercise;Balance training;Neuromuscular re-education;Patient/family education;Wheelchair mobility training;Manual techniques;Energy conservation;Dry needling;Passive range of motion;Taping;Spinal Manipulations;Joint Manipulations    PT Next Visit Plan Gait training, Transfer training, LE strengthening as appropriate.    PT Home Exercise Plan HEP: general LE and postural exercises    Consulted and Agree with Plan of Care Patient            3:52 PM, 11/24/22 Lewis Moccasin PT  Physical Therapist- [facility]    11/24/22, 3:52 PM

## 2022-11-28 NOTE — Therapy (Signed)
OUTPATIENT PHYSICAL THERAPY TREATMENT NOTE  Patient Name: Lindsey [name] MRN: [id number] DOB:1956/06/18, 67 y.o., female Today's Date: 11/29/2022  PCP: Dr. Tomasa Hose MD REFERRING PROVIDER: Leeroy Cha MD   PT End of Session - 11/29/22 1111     Visit Number 18    Number of Visits 42    Date for PT Re-Evaluation 01/24/23    Authorization Type Medicare    Authorization Time Period Recert [id number]    Progress Note Due on Visit 80    PT Start Time 1103    PT Stop Time 1142    PT Time Calculation (min) 39 min    Equipment Utilized During Treatment Gait belt    Activity Tolerance Patient tolerated treatment well;No increased pain    Behavior During Therapy WFL for tasks assessed/performed                                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 No pertinent surgical history. 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 ([place]) 09/24/2021   Cerebral embolism with cerebral infarction 09/23/2021   Pyelonephritis 09/19/2021   Pain due to onychomycosis of toenails of both feet 11/19/2020   Normocytic anemia 05/31/2020   Acute ischemic left MCA stroke ([place]) 04/30/2020   Right hemiplegia ([place]) 04/30/2020   Cerebrovascular accident ([place]) 04/21/2020    Atrial fibrillation with RVR ([place]) 04/21/2020   Essential hypertension 04/21/2020   Alcohol abuse 04/21/2020    REFERRING DIAG: s/p CVA  THERAPY DIAG:  Abnormality of gait and mobility  Difficulty in walking, not elsewhere classified  Muscle weakness (generalized)  Unsteadiness on feet  Other lack of coordination  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 Pt since using WC for primary mobility. Pt has hesitation with LLE, significantly weak RLE. Caregiver assists with all transfers. Pt has 2 level home, chair lift for 2nd level. Pt would like to improve transfers, improve Rt neglect, and progress general strength. 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 Primary impairment includes Rt hemiplegia and global aphasia. H/o prior CVA c Rt hemiplegia July 2021. PMH: AF, Renal Artery thrombosis, HTN, pyelonephritis, seizure activity, constipation, AKI, Vitamin D deficiency, and urinary incontinence.    PRECAUTIONS: fall risk, wearing RLE AFO  SUBJECTIVE:   Patient states She was really tired after last session from walking.    PAIN:  Are you having pain? No   TODAY'S TREATMENT:  11/29/2022  INTERVENTIONS:   Therex:  PROM to BLE with  Knee ext/Flex x 30 sec x 3 each LE AROM to right knee flex (heel on sliding disc) x 20 reps (minimal ability to ext or flex more than 20 deg AROM- Standing Right forward leg swing and back to neutral x 10 reps   TA: .  STS from straight back chair with allowing patient to pull up from // bar x 10 reps- SBA only today. VC to stand erect.  Static stand in // bars- using mirror for feedback- trying to evenly distribute her weight- VC to shift over to right affected side- several attempts- Patient with difficulty standing  erect today requiring increased VC to perform correctly Attempted forward/retro steps in // bars x 6 feet forward and backward- focusing on increased right LE step length and activation- min assist at times to perform correctly. Patient attempted several more trials but able to walk > 4 feet due to increased right LE weakness/fatigue.   PATIENT EDUCATION: Education details: Pt educated throughout session about proper posture and technique with exercises. Improved exercise technique, movement at target joints, use of target muscles after min to mod verbal, visual, tactile cues. Continued education on technique with STS.  Person educated: Patient Education method: Explanation, Demonstration, and Verbal cues Education comprehension: verbalized understanding, returned demonstration, and needs further education   HOME EXERCISE PROGRAM: Continue as previously given;    PT Short Term Goals -       PT SHORT TERM GOAL #1   Title Patient will be independent in home exercise program to improve strength/mobility for better functional independence with ADLs.    Baseline no formal HEP for LE/balance at this time; 02/08/2022=Patient continues to require VC for reminders and assist with hip march/knee ext (seated for strengthening); Reminders to continue to work on static stand at home.    Time 4    Period Weeks    Status Partially Met  04/07/22: complete somewhat regularly     Target Date 12/14/21              PT Long Term Goals -       PT LONG TERM GOAL #1   Title Patient will increase FOTO score to equal to or greater than   41  to demonstrate statistically significant improvement in mobility and quality of life.    Baseline 16 on 2/14; 02/08/2022=Patient unable to complete today- No family present but will attempt again next visit available with family present to assist in completion; 02/17/2022= 40; 06/28/2022- No caregiver present in PT session to assist in answering questions. 08/04/2022= 40;  11/01/2022= unable to complete- caregiver received a call and not in room to assist patient today.    Time 12    Period Weeks    Status On-going    Target Date 01/24/2023       PT LONG TERM GOAL #3   Title Patient  will complete five times sit to stand test in < 30 seconds with UE assistance indicating an increased LE strength and improved balance.    Baseline 58.47 s with UE assist and with A from PT for R LE positioning; 02/08/2022=54 sec with use of L UE support and Min physical assist to position right LE.  7/6: 30.5 sec, min A for LE positioning, some attempts did not come to full erect posture, did not let go with L UE from armchair with any attempt 05/05/22: 26.65 sec   Time 12    Period Weeks    Status Met    Target  Date 05/03/22      PT LONG TERM GOAL #4   Title Patient will increase Berg Balance score by > 6 points to demonstrate decreased fall risk during functional activities.    Baseline 12 on 2/14; 02/08/2022= 13/56  04/07/22: 17  8/3: 19    Time 12    Period Weeks    Status Met    Target Date 05/03/22      PT LONG TERM GOAL #5   Title Pt will transfer from seated position on table/chair to/from transport chair/WC in order to indicate improved transfer ability.    Baseline Requires Min A for LE positioning and to prevent LOB. 02/08/2022=Patient attempted to perform on her own- Unable to push herself up from chair- Very min assist to position right LE into more flexed position and VC for hand placement. Patient able to stand with min Assist with use of gait belt and VC to lean; 02/17/2022=Patient was able to transfer from transport w/c with some physical assist with Right to swing to pivot to side of chair. She was able to sit to stand today with only CGA and then stand pivot with right arm holding onto arm of chair. Only difficulty was stand to sit- Max VC not to plop into chair. 04/07/22: Pt performed movement of B LE to side of chair independently with chair transfer. Pt required min  A with R LE foot placement of floor. Pt requires increased time to complete but complete with CGA only for safety and no cues or A to prevent falling/ plopping into armchair.  8/3: Pt requires min A for maintenance of standig balance, able to navigate from transport chairr to arm chair with hemiwalker, does not controll descent with ahands still " flops" into chair; 08/04/2022- Patient continues to be inconsistent in her transfers- at times only CGA and other more min Assist and repetitive VC. Unclear due to some ongoing cognitive issues if patient will achieve this goal but still presents with potential. 09/08/2022- Patient able to stand with CGA yet continues to require initial VC for proper sequencing.   Time 12    Period Weeks    Status Partially met    Target Date 10/27/2022       PT LONG TERM GOAL #6   Title Patient will ambulate > 150 feet with CGA using Hemiwalker on level surfaces for improved household and short community distances.    Baseline 02/08/2022= 40 feet with use of HW, heavy CGA with max VC for gait sequencing and w/c follow. 5/18 = 85 feet with use of HW, close CGA  7/6: 45 feet, heavy use of hemiwalker 8/3: 85 feet with heavy hemi walker use, no Lob throughout, cues for appropriate weight distribution; 06/28/2022 - 205 feet with use of hemiwalker, CGA; 08/04/2022- Patient presents with inconsistent gait ability - able to walk 150 feet with max effort and increased time using hemiwalker and CGA. 10/18/2022= Patient ambulated around 45 feet at end of session and complained of being too fatigued to continue- very slow deliberate steps- difficulty advancing right LE; 11/01/2022- Patient ambulated approx 50 feet total today- using hemiwalker with step to gait- Mod VC for gait sequencing and close w/c follow- Decreased step height/length on affected right side.    Time 12    Period Weeks    Status On-going    Target Date 01/24/2023        PT LONG TERM GOAL #7   Title Pt will increase  10MWT by at least  0.13 m/s in order to demonstrate clinically significant improvement in community ambulation.    Baseline 02/08/2022= 0.05 m/s using HW 7/6: .077ms with heavy ue use of hemiwalker and CGA for balance 8/3: .053m 06/28/2022= 0.1 m/s (will keep goal active to ensure she is consistent); 08/04/2022- 0.09 m/s); 10/18/2021= 0.07 m/s using hemiwalker; 11/01/2022= 0.07 m/s   Time 12    Period Weeks    Status Ongoing     Target Date 01/24/2023      8.  Pt will improve 5X STS with full knee extension in standing each rep and with appropriate eccentric control in order to indicate improved transfer efficacy and improved LE strength  Baseline: 52.45 sec with UE support; 06/28/2022= 36.03 sec with UE support (VC for erect standing- patient continues to press up using back of legs against chair to stand); 08/04/2022- Patient required 45 sec using left UE and max VC (flopping at times with sitting); 10/18/2022 = 48 sec with Left UE and continued VC to lean forward and stand erect without falling back into chair- some improved eccentric control with last 2 reps. 11/01/2022= 39.7 sec with LE UE and CGA with VC to lean forward.  Goal status: ONGOING TARGET date: 01/24/2023  9.  Patient will increase Berg Balance score by > 6 points to demonstrate decreased fall risk during functional activities Baseline: 19; 06/28/2022= 19/56; 08/04/2022- Deferred secondary to patient too fatigued to attempt after walking today. Will reassess next visit. 10/18/2022=21  Goal status: ONGOING Target Date: 01/24/2023           Plan -     Clinical Impression Statement Continued per plan of care with LE strengthening- less Right LE activation with therex and later standing/walking in // bars. Max VC and patient with increased difficulty advancing Right LE - appeared to be limited by fatigue versus any difficulty coordinating movement. Patient will continue to benefit from skilled physical therapy intervention to address  impairments, improve QOL, and attain therapy goals.     Personal Factors and Comorbidities Age;Comorbidity 1;Comorbidity 2;Comorbidity 3+;Time since onset of injury/illness/exacerbation;Other    Comorbidities AFib, Renal Artery thrombosis, situational depression, HTN, Pyelnephritis, Seizure activity, COnstipation, AKI    Examination-Activity Limitations Bathing;Bed Mobility;Carry;Locomotion Level;Dressing;Sit;Squat;Stairs;Stand;Toileting;Transfers    Examination-Participation Restrictions Church;Community Activity;Laundry    Stability/Clinical Decision Making Evolving/Moderate complexity    Rehab Potential Fair    PT Frequency 1-2x / week    PT Duration 12 weeks    PT Treatment/Interventions ADLs/Self Care Home Management;Gait training;Stair training;Therapeutic activities;Therapeutic exercise;Balance training;Neuromuscular re-education;Patient/family education;Wheelchair mobility training;Manual techniques;Energy conservation;Dry needling;Passive range of motion;Taping;Spinal Manipulations;Joint Manipulations    PT Next Visit Plan Gait training, Transfer training, LE strengthening as appropriate.    PT Home Exercise Plan HEP: general LE and postural exercises    Consulted and Agree with Plan of Care Patient            2:08 PM, 11/29/22 JeLewis MoccasinT  Physical Therapist- [facility]  11/29/22, 2:08 PM

## 2022-11-29 ENCOUNTER — Ambulatory Visit: Payer: Medicare Other

## 2022-11-29 ENCOUNTER — Encounter: Payer: Medicare Other | Admitting: Occupational Therapy

## 2022-11-29 ENCOUNTER — Encounter: Payer: Medicare Other | Admitting: Speech Pathology

## 2022-11-29 ENCOUNTER — Ambulatory Visit: Payer: Medicare Other | Admitting: Occupational Therapy

## 2022-11-29 ENCOUNTER — Ambulatory Visit: Payer: Medicare Other | Admitting: Physical Therapy

## 2022-11-29 DIAGNOSIS — R278 Other lack of coordination: Secondary | ICD-10-CM | POA: Diagnosis not present

## 2022-11-29 DIAGNOSIS — R2681 Unsteadiness on feet: Secondary | ICD-10-CM

## 2022-11-29 DIAGNOSIS — R262 Difficulty in walking, not elsewhere classified: Secondary | ICD-10-CM

## 2022-11-29 DIAGNOSIS — M6281 Muscle weakness (generalized): Secondary | ICD-10-CM

## 2022-11-29 DIAGNOSIS — I63542 Cerebral infarction due to unspecified occlusion or stenosis of left cerebellar artery: Secondary | ICD-10-CM

## 2022-11-29 DIAGNOSIS — R269 Unspecified abnormalities of gait and mobility: Secondary | ICD-10-CM

## 2022-12-01 ENCOUNTER — Ambulatory Visit: Payer: Medicare Other | Admitting: Physical Therapy

## 2022-12-01 ENCOUNTER — Ambulatory Visit: Payer: Medicare Other

## 2022-12-01 ENCOUNTER — Encounter: Payer: Medicare Other | Admitting: Occupational Therapy

## 2022-12-01 ENCOUNTER — Encounter: Payer: Medicare Other | Admitting: Speech Pathology

## 2022-12-01 DIAGNOSIS — R269 Unspecified abnormalities of gait and mobility: Secondary | ICD-10-CM

## 2022-12-01 DIAGNOSIS — R262 Difficulty in walking, not elsewhere classified: Secondary | ICD-10-CM

## 2022-12-01 DIAGNOSIS — R2681 Unsteadiness on feet: Secondary | ICD-10-CM

## 2022-12-01 DIAGNOSIS — M6281 Muscle weakness (generalized): Secondary | ICD-10-CM

## 2022-12-01 DIAGNOSIS — I63542 Cerebral infarction due to unspecified occlusion or stenosis of left cerebellar artery: Secondary | ICD-10-CM

## 2022-12-01 DIAGNOSIS — I63512 Cerebral infarction due to unspecified occlusion or stenosis of left middle cerebral artery: Secondary | ICD-10-CM

## 2022-12-01 DIAGNOSIS — R278 Other lack of coordination: Secondary | ICD-10-CM

## 2022-12-01 DIAGNOSIS — R2689 Other abnormalities of gait and mobility: Secondary | ICD-10-CM

## 2022-12-01 NOTE — Therapy (Signed)
OUTPATIENT PHYSICAL THERAPY TREATMENT NOTE/Physical Therapy Progress Note   Dates of reporting period  10/18/2022   to   12/01/2022  Patient Name: Lindsey [name] MRN: [id number] DOB:Feb 15, 1956, 67 y.o., female Today's Date: 12/01/2022  PCP: Dr. Tomasa Hose MD REFERRING PROVIDER: Leeroy Cha MD   PT End of Session - 12/01/22 1103     Visit Number 53    Number of Visits 37    Date for PT Re-Evaluation 01/24/23    Authorization Type Medicare    Authorization Time Period Recert [id number]    Progress Note Due on Visit 80    PT Start Time 1103    PT Stop Time 1143    PT Time Calculation (min) 40 min    Equipment Utilized During Treatment Gait belt    Activity Tolerance Patient tolerated treatment well;No increased pain    Behavior During Therapy WFL for tasks assessed/performed                                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 No pertinent surgical history. 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 ([place]) 09/24/2021   Cerebral embolism with cerebral infarction 09/23/2021   Pyelonephritis 09/19/2021   Pain due to onychomycosis of toenails of both feet 11/19/2020   Normocytic anemia 05/31/2020   Acute ischemic left MCA stroke ([place])  04/30/2020   Right hemiplegia ([place]) 04/30/2020   Cerebrovascular accident ([place]) 04/21/2020   Atrial fibrillation with RVR ([place]) 04/21/2020   Essential hypertension 04/21/2020   Alcohol abuse 04/21/2020    REFERRING DIAG: s/p CVA  THERAPY DIAG:  Abnormality of gait and mobility  Difficulty in walking, not elsewhere classified  Muscle weakness (generalized)  Unsteadiness on feet  Other lack of coordination  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 Pt since using WC for primary mobility. Pt has hesitation with LLE, significantly weak RLE. Caregiver assists with all transfers. Pt has 2 level home, chair lift for 2nd level. Pt would like to improve transfers, improve Rt neglect, and progress general strength. 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 Primary impairment includes Rt hemiplegia and global aphasia. H/o prior CVA c Rt hemiplegia July 2021. PMH: AF, Renal Artery thrombosis, HTN, pyelonephritis, seizure activity, constipation, AKI, Vitamin D deficiency, and urinary incontinence.    PRECAUTIONS: fall risk, wearing RLE AFO  SUBJECTIVE:   Patient reports no new issues- caregiver  states she has done better with transfers- car and switching from one chair to the other.     PAIN:  Are you having pain? No   TODAY'S TREATMENT:  12/01/2022  INTERVENTIONS:    Therex:   AROM to right knee flex (heel on sliding disc) x 20 reps PROM to BLE with Knee ext/Flex x 30 sec x 3 each LE Seated AAROM Right hip flex march- (min mm activation)    AROM- Standing Right forward leg swing and back to neutral x 10 reps Standing march at support bar- attempting to alt LE -difficulty  with lifting right LE x 10 reps each LE   TA: .  SPT from  transport chair to straight back chair - Min assist to maneuver to side of chair then required  min/mod assist to SPT with chair positioned perpendicular to another chair- Min assist to stand and mod VC to stand erect to pivot- able to pivot to right and later to left - able to move right LE with increased effort and time.   Static stand at support bar with min UE support and CGA - patient required VC to stand erect - tendency to forward flex    See goal section for reassessment visit.   PATIENT EDUCATION: Education details: Pt educated throughout session about proper posture and technique with exercises. Improved exercise technique, movement at target joints, use of target muscles after min to mod verbal, visual, tactile cues. Continued education on technique with STS.  Person educated: Patient Education method: Explanation, Demonstration, and Verbal cues Education comprehension: verbalized understanding, returned demonstration, and needs further education   HOME EXERCISE PROGRAM: Continue as previously given;    PT Short Term Goals -       PT SHORT TERM GOAL #1   Title Patient will be independent in home exercise program to improve strength/mobility for better functional independence with ADLs.    Baseline no formal HEP for LE/balance at this time; 02/08/2022=Patient continues to require VC for reminders and assist with hip march/knee ext (seated for strengthening); Reminders to continue to work on static stand at home.    Time 4    Period Weeks    Status Partially Met  04/07/22: complete somewhat regularly     Target Date 12/14/21              PT Long Term Goals -       PT LONG TERM GOAL #1   Title Patient will increase FOTO score to equal to or greater than   41  to demonstrate statistically significant improvement in mobility and quality of life.    Baseline 16 on 2/14; 02/08/2022=Patient unable to complete  today- No family present but will attempt again next visit available with family present to assist in completion; 02/17/2022= 40; 06/28/2022- No caregiver present in PT session to assist in answering questions. 08/04/2022= 40; 11/01/2022= unable to complete- caregiver received a call and not in room to assist patient today. 11/22/2022= 40   Time 12    Period Weeks    Status On-going    Target Date 01/24/2023       PT LONG TERM GOAL #3   Title Patient  will complete five times sit to stand test in < 30 seconds with UE assistance indicating an increased LE strength and improved balance.    Baseline 58.47 s with UE assist and with A from PT for R LE positioning; 02/08/2022=54 sec with use of L UE support and Min  physical assist to position right LE.  7/6: 30.5 sec, min A for LE positioning, some attempts did not come to full erect posture, did not let go with L UE from armchair with any attempt 05/05/22: 26.65 sec   Time 12    Period Weeks    Status Met    Target Date 05/03/22      PT LONG TERM GOAL #4   Title Patient will increase Berg Balance score by > 6 points to demonstrate decreased fall risk during functional activities.    Baseline 12 on 2/14; 02/08/2022= 13/56  04/07/22: 17  8/3: 19    Time 12    Period Weeks    Status Met    Target Date 05/03/22      PT LONG TERM GOAL #5   Title Pt will transfer from seated position on table/chair to/from transport chair/WC in order to indicate improved transfer ability.    Baseline Requires Min A for LE positioning and to prevent LOB. 02/08/2022=Patient attempted to perform on her own- Unable to push herself up from chair- Very min assist to position right LE into more flexed position and VC for hand placement. Patient able to stand with min Assist with use of gait belt and VC to lean; 02/17/2022=Patient was able to transfer from transport w/c with some physical assist with Right to swing to pivot to side of chair. She was able to sit to stand today with only CGA  and then stand pivot with right arm holding onto arm of chair. Only difficulty was stand to sit- Max VC not to plop into chair. 04/07/22: Pt performed movement of B LE to side of chair independently with chair transfer. Pt required min A with R LE foot placement of floor. Pt requires increased time to complete but complete with CGA only for safety and no cues or A to prevent falling/ plopping into armchair.  8/3: Pt requires min A for maintenance of standig balance, able to navigate from transport chairr to arm chair with hemiwalker, does not controll descent with ahands still " flops" into chair; 08/04/2022- Patient continues to be inconsistent in her transfers- at times only CGA and other more min Assist and repetitive VC. Unclear due to some ongoing cognitive issues if patient will achieve this goal but still presents with potential. 09/08/2022- Patient able to stand with CGA yet continues to require initial VC for proper sequencing. 12/01/2022= Patient continues to present with inconsistent ability to stand pivot transfer - requiring min to mod assist at times   Time 12    Period Weeks    Status Partially met    Target Date 10/27/2022       PT LONG TERM GOAL #6   Title Patient will ambulate > 150 feet with CGA using Hemiwalker on level surfaces for improved household and short community distances.    Baseline 02/08/2022= 40 feet with use of HW, heavy CGA with max VC for gait sequencing and w/c follow. 5/18 = 85 feet with use of HW, close CGA  7/6: 45 feet, heavy use of hemiwalker 8/3: 85 feet with heavy hemi walker use, no Lob throughout, cues for appropriate weight distribution; 06/28/2022 - 205 feet with use of hemiwalker, CGA; 08/04/2022- Patient presents with inconsistent gait ability - able to walk 150 feet with max effort and increased time using hemiwalker and CGA. 10/18/2022= Patient ambulated around 45 feet at end of session and complained of being too fatigued to continue- very slow deliberate  steps-  difficulty advancing right LE; 11/01/2022- Patient ambulated approx 50 feet total today- using hemiwalker with step to gait- Mod VC for gait sequencing and close w/c follow- Decreased step height/length on affected right side. 12/01/2022- Not tested today but patient last amb 45 feet last during last    Time 12    Period Weeks    Status On-going    Target Date 01/24/2023        PT LONG TERM GOAL #7   Title Pt will increase 10MWT by at least 0.13 m/s in order to demonstrate clinically significant improvement in community ambulation.    Baseline 02/08/2022= 0.05 m/s using HW 7/6: .078ms with heavy ue use of hemiwalker and CGA for balance 8/3: .0530m 06/28/2022= 0.1 m/s (will keep goal active to ensure she is consistent); 08/04/2022- 0.09 m/s); 10/18/2021= 0.07 m/s using hemiwalker; 11/01/2022= 0.07 m/s; 12/01/2022= Not attempted today will assess next visit   Time 12    Period Weeks    Status Ongoing     Target Date 01/24/2023      8.  Pt will improve 5X STS with full knee extension in standing each rep and with appropriate eccentric control in order to indicate improved transfer efficacy and improved LE strength  Baseline: 52.45 sec with UE support; 06/28/2022= 36.03 sec with UE support (VC for erect standing- patient continues to press up using back of legs against chair to stand); 08/04/2022- Patient required 45 sec using left UE and max VC (flopping at times with sitting); 10/18/2022 = 48 sec with Left UE and continued VC to lean forward and stand erect without falling back into chair- some improved eccentric control with last 2 reps. 11/01/2022= 39.7 sec with LE UE and CGA with VC to lean forward. 12/01/2022= attempted yet patient required min/mod assist  to recover.  Goal status: ONGOING TARGET date: 01/24/2023  9.  Patient will increase Berg Balance score by > 6 points to demonstrate decreased fall risk during functional activities Baseline: 19; 06/28/2022= 19/56; 08/04/2022- Deferred secondary to  patient too fatigued to attempt after walking today. Will reassess next visit. 10/18/2022=21  Goal status: ONGOING Target Date: 01/24/2023           Plan -     Clinical Impression Statement Continued per plan of care with reassessment visit for progress note. Patient continues to be inconsistent with transfer ability and demo poor Right LE activation overall today. She required more assist today with transfers and unable to complete the 5x STS without assist. Patient did stand better at support bar with less overall cues. Patient's condition has the potential to improve in response to therapy. Maximum improvement is yet to be obtained. The anticipated improvement is attainable and reasonable in a generally predictable time.  Patient will continue to benefit from skilled physical therapy intervention to address impairments, improve QOL, and attain therapy goals.     Personal Factors and Comorbidities Age;Comorbidity 1;Comorbidity 2;Comorbidity 3+;Time since onset of injury/illness/exacerbation;Other    Comorbidities AFib, Renal Artery thrombosis, situational depression, HTN, Pyelnephritis, Seizure activity, COnstipation, AKI    Examination-Activity Limitations Bathing;Bed Mobility;Carry;Locomotion Level;Dressing;Sit;Squat;Stairs;Stand;Toileting;Transfers    Examination-Participation Restrictions Church;Community Activity;Laundry    Stability/Clinical Decision Making Evolving/Moderate complexity    Rehab Potential Fair    PT Frequency 1-2x / week    PT Duration 12 weeks    PT Treatment/Interventions ADLs/Self Care Home Management;Gait training;Stair training;Therapeutic activities;Therapeutic exercise;Balance training;Neuromuscular re-education;Patient/family education;Wheelchair mobility training;Manual techniques;Energy conservation;Dry needling;Passive range of motion;Taping;Spinal Manipulations;Joint Manipulations    PT Next Visit Plan  Gait training, Transfer training, LE strengthening as  appropriate.    PT Home Exercise Plan HEP: general LE and postural exercises    Consulted and Agree with Plan of Care Patient            1:00 PM, 12/01/22 Lewis Moccasin PT  Physical Therapist- [facility]    12/01/22, 1:00 PM

## 2022-12-05 NOTE — Therapy (Incomplete)
OUTPATIENT PHYSICAL THERAPY TREATMENT NOTE   Patient Name: Lindsey [name] MRN: [id number] DOB:[birth year], 67 y.o., female Today's Date: 12/01/2022  PCP: Dr. Tomasa Hose MD REFERRING PROVIDER: Leeroy Cha MD   PT End of Session - 12/01/22 1103     Visit Number 37    Number of Visits 48    Date for PT Re-Evaluation 01/24/23    Authorization Type Medicare    Authorization Time Period Recert [id number]    Progress Note Due on Visit 80    PT Start Time 1103    PT Stop Time 1143    PT Time Calculation (min) 40 min    Equipment Utilized During Treatment Gait belt    Activity Tolerance Patient tolerated treatment well;No increased pain    Behavior During Therapy WFL for tasks assessed/performed                                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 No pertinent surgical history. 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 ([place]) 09/24/2021   Cerebral embolism with cerebral infarction 09/23/2021   Pyelonephritis 09/19/2021   Pain due to onychomycosis of toenails of both feet 11/19/2020   Normocytic anemia 05/31/2020   Acute ischemic left MCA stroke ([place]) 04/30/2020   Right hemiplegia ([place]) 04/30/2020   Cerebrovascular accident ([place]) 04/21/2020    Atrial fibrillation with RVR ([place]) 04/21/2020   Essential hypertension 04/21/2020   Alcohol abuse 04/21/2020    REFERRING DIAG: s/p CVA  THERAPY DIAG:  Abnormality of gait and mobility  Difficulty in walking, not elsewhere classified  Muscle weakness (generalized)  Unsteadiness on feet  Other lack of coordination  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 Pt since using WC for primary mobility. Pt has hesitation with LLE, significantly weak RLE. Caregiver assists with all transfers. Pt has 2 level home, chair lift for 2nd level. Pt would like to improve transfers, improve Rt neglect, and progress general strength. 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 Primary impairment includes Rt hemiplegia and global aphasia. H/o prior CVA c Rt hemiplegia July 2021. PMH: AF, Renal Artery thrombosis, HTN, pyelonephritis, seizure activity, constipation, AKI, Vitamin D deficiency, and urinary incontinence.    PRECAUTIONS: fall risk, wearing RLE AFO  SUBJECTIVE:  ***   PAIN:  Are you having pain? No   TODAY'S TREATMENT:  12/01/2022  INTERVENTIONS:    Therex:  AROM to right knee flex (heel on sliding disc) x 20 reps PROM to BLE with Knee ext/Flex x 30 sec x 3 each LE Seated AAROM Right hip flex march- (min mm activation)    AROM- Standing Right forward leg swing and back to neutral x 10 reps Standing march at support bar- attempting to alt LE -difficulty with lifting right LE x 10 reps each LE   TA: .  SPT from  transport chair to straight back chair - Min assist to maneuver to side of chair then required  min/mod assist to SPT with chair positioned perpendicular to  another chair- Min assist to stand and mod VC to stand erect to pivot- able to pivot to right and later to left - able to move right LE with increased effort and time.   Static stand at support bar with min UE support and CGA - patient required VC to stand erect - tendency to forward flex    See goal section for reassessment visit.   PATIENT EDUCATION: Education details: Pt educated throughout session about proper posture and technique with exercises. Improved exercise technique, movement at target joints, use of target muscles after min to mod verbal, visual, tactile cues. Continued education on technique with STS.  Person educated: Patient Education method: Explanation, Demonstration, and Verbal cues Education comprehension: verbalized understanding, returned demonstration, and needs further education   HOME EXERCISE PROGRAM: Continue as previously given;    PT Short Term Goals -       PT SHORT TERM GOAL #1   Title Patient will be independent in home exercise program to improve strength/mobility for better functional independence with ADLs.    Baseline no formal HEP for LE/balance at this time; 02/08/2022=Patient continues to require VC for reminders and assist with hip march/knee ext (seated for strengthening); Reminders to continue to work on static stand at home.    Time 4    Period Weeks    Status Partially Met  04/07/22: complete somewhat regularly     Target Date 12/14/21              PT Long Term Goals -       PT LONG TERM GOAL #1   Title Patient will increase FOTO score to equal to or greater than   41  to demonstrate statistically significant improvement in mobility and quality of life.    Baseline 16 on 2/14; 02/08/2022=Patient unable to complete today- No family present but will attempt again next visit available with family present to assist in completion; 02/17/2022= 40; 06/28/2022- No caregiver present in PT session to assist in answering questions. 08/04/2022= 40;  11/01/2022= unable to complete- caregiver received a call and not in room to assist patient today. 11/22/2022= 40   Time 12    Period Weeks    Status On-going    Target Date 01/24/2023       PT LONG TERM GOAL #3   Title Patient  will complete five times sit to stand test in < 30 seconds with UE assistance indicating an increased LE strength and improved balance.    Baseline 58.47 s with UE assist and with A from PT for R LE positioning; 02/08/2022=54 sec with use of L UE support and Min physical assist to position right LE.  7/6: 30.5 sec, min A for LE positioning, some attempts did not come to full erect posture, did not let go with L UE from armchair with any attempt 05/05/22: 26.65 sec  Time 12    Period Weeks    Status Met    Target Date 05/03/22      PT LONG TERM GOAL #4   Title Patient will increase Berg Balance score by > 6 points to demonstrate decreased fall risk during functional activities.    Baseline 12 on 2/14; 02/08/2022= 13/56  04/07/22: 17  8/3: 19    Time 12    Period Weeks    Status Met    Target Date 05/03/22      PT LONG TERM GOAL #5   Title Pt will transfer from seated position on table/chair to/from transport chair/WC in order to indicate improved transfer ability.    Baseline Requires Min A for LE positioning and to prevent LOB. 02/08/2022=Patient attempted to perform on her own- Unable to push herself up from chair- Very min assist to position right LE into more flexed position and VC for hand placement. Patient able to stand with min Assist with use of gait belt and VC to lean; 02/17/2022=Patient was able to transfer from transport w/c with some physical assist with Right to swing to pivot to side of chair. She was able to sit to stand today with only CGA and then stand pivot with right arm holding onto arm of chair. Only difficulty was stand to sit- Max VC not to plop into chair. 04/07/22: Pt performed movement of B LE to side of chair independently with chair transfer. Pt  required min A with R LE foot placement of floor. Pt requires increased time to complete but complete with CGA only for safety and no cues or A to prevent falling/ plopping into armchair.  8/3: Pt requires min A for maintenance of standig balance, able to navigate from transport chairr to arm chair with hemiwalker, does not controll descent with ahands still " flops" into chair; 08/04/2022- Patient continues to be inconsistent in her transfers- at times only CGA and other more min Assist and repetitive VC. Unclear due to some ongoing cognitive issues if patient will achieve this goal but still presents with potential. 09/08/2022- Patient able to stand with CGA yet continues to require initial VC for proper sequencing. 12/01/2022= Patient continues to present with inconsistent ability to stand pivot transfer - requiring min to mod assist at times   Time 12    Period Weeks    Status Partially met    Target Date 10/27/2022       PT LONG TERM GOAL #6   Title Patient will ambulate > 150 feet with CGA using Hemiwalker on level surfaces for improved household and short community distances.    Baseline 02/08/2022= 40 feet with use of HW, heavy CGA with max VC for gait sequencing and w/c follow. 5/18 = 85 feet with use of HW, close CGA  7/6: 45 feet, heavy use of hemiwalker 8/3: 85 feet with heavy hemi walker use, no Lob throughout, cues for appropriate weight distribution; 06/28/2022 - 205 feet with use of hemiwalker, CGA; 08/04/2022- Patient presents with inconsistent gait ability - able to walk 150 feet with max effort and increased time using hemiwalker and CGA. 10/18/2022= Patient ambulated around 45 feet at end of session and complained of being too fatigued to continue- very slow deliberate steps- difficulty advancing right LE; 11/01/2022- Patient ambulated approx 50 feet total today- using hemiwalker with step to gait- Mod VC for gait sequencing and close w/c follow- Decreased step height/length on affected right  side. 12/01/2022- Not tested today but patient last  amb 45 feet last during last    Time 12    Period Weeks    Status On-going    Target Date 01/24/2023        PT LONG TERM GOAL #7   Title Pt will increase 10MWT by at least 0.13 m/s in order to demonstrate clinically significant improvement in community ambulation.    Baseline 02/08/2022= 0.05 m/s using HW 7/6: .075ms with heavy ue use of hemiwalker and CGA for balance 8/3: .0527m 06/28/2022= 0.1 m/s (will keep goal active to ensure she is consistent); 08/04/2022- 0.09 m/s); 10/18/2021= 0.07 m/s using hemiwalker; 11/01/2022= 0.07 m/s; 12/01/2022= Not attempted today will assess next visit   Time 12    Period Weeks    Status Ongoing     Target Date 01/24/2023      8.  Pt will improve 5X STS with full knee extension in standing each rep and with appropriate eccentric control in order to indicate improved transfer efficacy and improved LE strength  Baseline: 52.45 sec with UE support; 06/28/2022= 36.03 sec with UE support (VC for erect standing- patient continues to press up using back of legs against chair to stand); 08/04/2022- Patient required 45 sec using left UE and max VC (flopping at times with sitting); 10/18/2022 = 48 sec with Left UE and continued VC to lean forward and stand erect without falling back into chair- some improved eccentric control with last 2 reps. 11/01/2022= 39.7 sec with LE UE and CGA with VC to lean forward. 12/01/2022= attempted yet patient required min/mod assist  to recover.  Goal status: ONGOING TARGET date: 01/24/2023  9.  Patient will increase Berg Balance score by > 6 points to demonstrate decreased fall risk during functional activities Baseline: 19; 06/28/2022= 19/56; 08/04/2022- Deferred secondary to patient too fatigued to attempt after walking today. Will reassess next visit. 10/18/2022=21  Goal status: ONGOING Target Date: 01/24/2023           Plan -     Clinical Impression Statement Continued per plan of care  with reassessment visit for progress note. Patient continues to be inconsistent with transfer ability and demo poor Right LE activation overall today. She required more assist today with transfers and unable to complete the 5x STS without assist. Patient did stand better at support bar with less overall cues. Patient's condition has the potential to improve in response to therapy. Maximum improvement is yet to be obtained. The anticipated improvement is attainable and reasonable in a generally predictable time.  Patient will continue to benefit from skilled physical therapy intervention to address impairments, improve QOL, and attain therapy goals.     Personal Factors and Comorbidities Age;Comorbidity 1;Comorbidity 2;Comorbidity 3+;Time since onset of injury/illness/exacerbation;Other    Comorbidities AFib, Renal Artery thrombosis, situational depression, HTN, Pyelnephritis, Seizure activity, COnstipation, AKI    Examination-Activity Limitations Bathing;Bed Mobility;Carry;Locomotion Level;Dressing;Sit;Squat;Stairs;Stand;Toileting;Transfers    Examination-Participation Restrictions Church;Community Activity;Laundry    Stability/Clinical Decision Making Evolving/Moderate complexity    Rehab Potential Fair    PT Frequency 1-2x / week    PT Duration 12 weeks    PT Treatment/Interventions ADLs/Self Care Home Management;Gait training;Stair training;Therapeutic activities;Therapeutic exercise;Balance training;Neuromuscular re-education;Patient/family education;Wheelchair mobility training;Manual techniques;Energy conservation;Dry needling;Passive range of motion;Taping;Spinal Manipulations;Joint Manipulations    PT Next Visit Plan Gait training, Transfer training, LE strengthening as appropriate.    PT Home Exercise Plan HEP: general LE and postural exercises    Consulted and Agree with Plan of Care Patient  1:00 PM, 12/01/22 Lewis Moccasin PT  Physical Therapist- [facility]    12/01/22, 1:00 PM

## 2022-12-06 ENCOUNTER — Ambulatory Visit: Payer: Medicare Other | Admitting: Occupational Therapy

## 2022-12-06 ENCOUNTER — Encounter: Payer: Medicare Other | Admitting: Occupational Therapy

## 2022-12-06 ENCOUNTER — Encounter: Payer: Medicare Other | Admitting: Speech Pathology

## 2022-12-06 ENCOUNTER — Ambulatory Visit: Payer: Medicare Other | Admitting: Physical Therapy

## 2022-12-06 ENCOUNTER — Ambulatory Visit: Payer: Medicare Other | Attending: Physical Medicine and Rehabilitation | Admitting: Physical Therapy

## 2022-12-06 DIAGNOSIS — R278 Other lack of coordination: Secondary | ICD-10-CM | POA: Diagnosis present

## 2022-12-06 DIAGNOSIS — R2689 Other abnormalities of gait and mobility: Secondary | ICD-10-CM | POA: Insufficient documentation

## 2022-12-06 DIAGNOSIS — R2681 Unsteadiness on feet: Secondary | ICD-10-CM | POA: Diagnosis present

## 2022-12-06 DIAGNOSIS — R269 Unspecified abnormalities of gait and mobility: Secondary | ICD-10-CM

## 2022-12-06 DIAGNOSIS — I63542 Cerebral infarction due to unspecified occlusion or stenosis of left cerebellar artery: Secondary | ICD-10-CM | POA: Insufficient documentation

## 2022-12-06 DIAGNOSIS — R262 Difficulty in walking, not elsewhere classified: Secondary | ICD-10-CM

## 2022-12-06 DIAGNOSIS — M6281 Muscle weakness (generalized): Secondary | ICD-10-CM | POA: Diagnosis present

## 2022-12-06 DIAGNOSIS — I63512 Cerebral infarction due to unspecified occlusion or stenosis of left middle cerebral artery: Secondary | ICD-10-CM | POA: Insufficient documentation

## 2022-12-06 NOTE — Therapy (Signed)
OUTPATIENT PHYSICAL THERAPY TREATMENT NOTE   Patient Name: Lindsey [name] MRN: [id number] DOB:March 13, 1956, 67 y.o., female Today's Date: 12/06/2022  PCP: Dr. Tomasa Hose MD REFERRING PROVIDER: Leeroy Cha MD   PT End of Session - 12/06/22 1051     Visit Number [id number]    Number of Visits 63    Date for PT Re-Evaluation 01/24/23    Authorization Type Medicare    Authorization Time Period Recert [id number]    Progress Note Due on Visit 90    PT Start Time 1103    PT Stop Time 1143    PT Time Calculation (min) 40 min    Equipment Utilized During Treatment Gait belt    Activity Tolerance Patient tolerated treatment well;No increased pain    Behavior During Therapy WFL for tasks assessed/performed                                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 ([place]) 09/24/2021   Cerebral embolism with cerebral infarction 09/23/2021   Pyelonephritis 09/19/2021   Pain due to onychomycosis of toenails of both feet 11/19/2020   Normocytic anemia 05/31/2020   Acute ischemic left MCA stroke ([place]) 04/30/2020   Right hemiplegia ([place]) 04/30/2020   Cerebrovascular accident ([place]) 04/21/2020   Atrial  fibrillation with RVR ([place]) 04/21/2020   Essential hypertension 04/21/2020   Alcohol abuse 04/21/2020    REFERRING DIAG: s/p CVA  THERAPY DIAG:  Abnormality of gait and mobility  Difficulty in walking, not elsewhere classified  Muscle weakness (generalized)  Unsteadiness on feet  Rationale for Evaluation and Treatment Rehabilitation  PERTINENT HISTORY: PTA was a short distance AMB c SPC prior to recent stroke in December 2022. Pt since using WC for primary mobility. Pt has hesitation with LLE, significantly weak RLE. Caregiver assists with all transfers. Pt has 2 level home, chair lift for 2nd level. Pt would like to improve transfers, improve Rt neglect, and progress general strength. 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 Primary impairment includes Rt hemiplegia and global aphasia. H/o prior CVA c Rt hemiplegia July 2021. PMH: AF, Renal Artery thrombosis, HTN, pyelonephritis, seizure activity, constipation, AKI, Vitamin D deficiency, and urinary incontinence.    PRECAUTIONS: fall risk, wearing RLE AFO  SUBJECTIVE:   Patient reports no new issues she did not do anything exciting over the weekend.    PAIN:  Are you having pain? No   TODAY'S TREATMENT:  12/01/2022  INTERVENTIONS:    Therex:   AROM to right knee flex (heel on towel) x 20 reps PROM to BLE with Knee ext/Flex  x 30 sec x 3 each LE Seated hip abduction and adduction with knee extended and physical therapist assisting with this.  Performs 2 sets of 10 repetitions with this activity Seated AAROM Right hip flex march- (min mm activation)    AROM- Standing Right forward leg swing and back to neutral x 4 reps tried to additional reps with patient to fatigue to continue and needs to sit down -Another round or seated rest break and patient still unable to complete  Stand and ambulation across parallel bars to other side and then patient performs 180 degree turn to transition to  transport chair for end of session -Patient requires assistance with right lower extremity forward motion is throughout she is unable to properly shift weight to left side and propel right lower extremity for ambulation in parallel bars.  Unless otherwise stated, min A was provided and gait belt donned in order to ensure pt safety     PATIENT EDUCATION: Education details: Pt educated throughout session about proper posture and technique with exercises. Improved exercise technique, movement at target joints, use of target muscles after min to mod verbal, visual, tactile cues. Continued education on technique with STS.  Person educated: Patient Education method: Explanation, Demonstration, and Verbal cues Education comprehension: verbalized understanding, returned demonstration, and needs further education   HOME EXERCISE PROGRAM: Continue as previously given;    PT Short Term Goals -       PT SHORT TERM GOAL #1   Title Patient will be independent in home exercise program to improve strength/mobility for better functional independence with ADLs.    Baseline no formal HEP for LE/balance at this time; 02/08/2022=Patient continues to require VC for reminders and assist with hip march/knee ext (seated for strengthening); Reminders to continue to work on static stand at home.    Time 4    Period Weeks    Status Partially Met  04/07/22: complete somewhat regularly     Target Date 12/14/21              PT Long Term Goals -       PT LONG TERM GOAL #1   Title Patient will increase FOTO score to equal to or greater than   41  to demonstrate statistically significant improvement in mobility and quality of life.    Baseline 16 on 2/14; 02/08/2022=Patient unable to complete today- No family present but will attempt again next visit available with family present to assist in completion; 02/17/2022= 40; 06/28/2022- No caregiver present in PT session to assist in answering questions. 08/04/2022= 40;  11/01/2022= unable to complete- caregiver received a call and not in room to assist patient today. 11/22/2022= 40   Time 12    Period Weeks    Status On-going    Target Date 01/24/2023       PT LONG TERM GOAL #3   Title Patient  will complete five times sit to stand test in < 30 seconds with UE assistance indicating an increased LE strength and improved balance.    Baseline 58.47 s with UE assist and with A from PT for R LE positioning; 02/08/2022=54 sec with use of L UE support and Min physical assist to position right LE.  7/6: 30.5 sec, min A for LE positioning, some attempts did not come to full erect posture, did not let go with L UE from armchair with any attempt 05/05/22: 26.65 sec   Time 12    Period Weeks    Status Met  Target Date 05/03/22      PT LONG TERM GOAL #4   Title Patient will increase Berg Balance score by > 6 points to demonstrate decreased fall risk during functional activities.    Baseline 12 on 2/14; 02/08/2022= 13/56  04/07/22: 17  8/3: 19    Time 12    Period Weeks    Status Met    Target Date 05/03/22      PT LONG TERM GOAL #5   Title Pt will transfer from seated position on table/chair to/from transport chair/WC in order to indicate improved transfer ability.    Baseline Requires Min A for LE positioning and to prevent LOB. 02/08/2022=Patient attempted to perform on her own- Unable to push herself up from chair- Very min assist to position right LE into more flexed position and VC for hand placement. Patient able to stand with min Assist with use of gait belt and VC to lean; 02/17/2022=Patient was able to transfer from transport w/c with some physical assist with Right to swing to pivot to side of chair. She was able to sit to stand today with only CGA and then stand pivot with right arm holding onto arm of chair. Only difficulty was stand to sit- Max VC not to plop into chair. 04/07/22: Pt performed movement of B LE to side of chair independently with chair transfer. Pt  required min A with R LE foot placement of floor. Pt requires increased time to complete but complete with CGA only for safety and no cues or A to prevent falling/ plopping into armchair.  8/3: Pt requires min A for maintenance of standig balance, able to navigate from transport chairr to arm chair with hemiwalker, does not controll descent with ahands still " flops" into chair; 08/04/2022- Patient continues to be inconsistent in her transfers- at times only CGA and other more min Assist and repetitive VC. Unclear due to some ongoing cognitive issues if patient will achieve this goal but still presents with potential. 09/08/2022- Patient able to stand with CGA yet continues to require initial VC for proper sequencing. 12/01/2022= Patient continues to present with inconsistent ability to stand pivot transfer - requiring min to mod assist at times   Time 12    Period Weeks    Status Partially met    Target Date 10/27/2022       PT LONG TERM GOAL #6   Title Patient will ambulate > 150 feet with CGA using Hemiwalker on level surfaces for improved household and short community distances.    Baseline 02/08/2022= 40 feet with use of HW, heavy CGA with max VC for gait sequencing and w/c follow. 5/18 = 85 feet with use of HW, close CGA  7/6: 45 feet, heavy use of hemiwalker 8/3: 85 feet with heavy hemi walker use, no Lob throughout, cues for appropriate weight distribution; 06/28/2022 - 205 feet with use of hemiwalker, CGA; 08/04/2022- Patient presents with inconsistent gait ability - able to walk 150 feet with max effort and increased time using hemiwalker and CGA. 10/18/2022= Patient ambulated around 45 feet at end of session and complained of being too fatigued to continue- very slow deliberate steps- difficulty advancing right LE; 11/01/2022- Patient ambulated approx 50 feet total today- using hemiwalker with step to gait- Mod VC for gait sequencing and close w/c follow- Decreased step height/length on affected right  side. 12/01/2022- Not tested today but patient last amb 45 feet last during last    Time 12    Period  Weeks    Status On-going    Target Date 01/24/2023        PT LONG TERM GOAL #7   Title Pt will increase 10MWT by at least 0.13 m/s in order to demonstrate clinically significant improvement in community ambulation.    Baseline 02/08/2022= 0.05 m/s using HW 7/6: .047ms with heavy ue use of hemiwalker and CGA for balance 8/3: .0513m 06/28/2022= 0.1 m/s (will keep goal active to ensure she is consistent); 08/04/2022- 0.09 m/s); 10/18/2021= 0.07 m/s using hemiwalker; 11/01/2022= 0.07 m/s; 12/01/2022= Not attempted today will assess next visit   Time 12    Period Weeks    Status Ongoing     Target Date 01/24/2023      8.  Pt will improve 5X STS with full knee extension in standing each rep and with appropriate eccentric control in order to indicate improved transfer efficacy and improved LE strength  Baseline: 52.45 sec with UE support; 06/28/2022= 36.03 sec with UE support (VC for erect standing- patient continues to press up using back of legs against chair to stand); 08/04/2022- Patient required 45 sec using left UE and max VC (flopping at times with sitting); 10/18/2022 = 48 sec with Left UE and continued VC to lean forward and stand erect without falling back into chair- some improved eccentric control with last 2 reps. 11/01/2022= 39.7 sec with LE UE and CGA with VC to lean forward. 12/01/2022= attempted yet patient required min/mod assist  to recover.  Goal status: ONGOING TARGET date: 01/24/2023  9.  Patient will increase Berg Balance score by > 6 points to demonstrate decreased fall risk during functional activities Baseline: 19; 06/28/2022= 19/56; 08/04/2022- Deferred secondary to patient too fatigued to attempt after walking today. Will reassess next visit. 10/18/2022=21  Goal status: ONGOING Target Date: 01/24/2023           Plan -     Clinical Impression Statement Continue with plan of care  as laid out in evaluation and recent prior sessions.  Patient continues to have significant difficulty with lower right lower extremity muscle activation piquantly for functional activities such as transfers and standing.  Patient is motivated to work hard with physical therapist unsure of carryover to her current setting at home for completion of home related activities.  Will continue to focus on lower extremity strengthening and functional ambulation training to improve patient's transfer safety and improve her overall function.   Personal Factors and Comorbidities Age;Comorbidity 1;Comorbidity 2;Comorbidity 3+;Time since onset of injury/illness/exacerbation;Other    Comorbidities AFib, Renal Artery thrombosis, situational depression, HTN, Pyelnephritis, Seizure activity, COnstipation, AKI    Examination-Activity Limitations Bathing;Bed Mobility;Carry;Locomotion Level;Dressing;Sit;Squat;Stairs;Stand;Toileting;Transfers    Examination-Participation Restrictions Church;Community Activity;Laundry    Stability/Clinical Decision Making Evolving/Moderate complexity    Rehab Potential Fair    PT Frequency 1-2x / week    PT Duration 12 weeks    PT Treatment/Interventions ADLs/Self Care Home Management;Gait training;Stair training;Therapeutic activities;Therapeutic exercise;Balance training;Neuromuscular re-education;Patient/family education;Wheelchair mobility training;Manual techniques;Energy conservation;Dry needling;Passive range of motion;Taping;Spinal Manipulations;Joint Manipulations    PT Next Visit Plan Gait training, Transfer training, LE strengthening as appropriate.    PT Home Exercise Plan HEP: general LE and postural exercises    Consulted and Agree with Plan of Care Patient            12:15 PM, 12[facility]06[facility]22 [facility]  12/06/22, 12:15 PM

## 2022-12-08 ENCOUNTER — Encounter: Payer: Medicare Other | Admitting: Speech Pathology

## 2022-12-08 ENCOUNTER — Ambulatory Visit: Payer: Medicare Other

## 2022-12-08 ENCOUNTER — Ambulatory Visit: Payer: Medicare Other | Admitting: Physical Therapy

## 2022-12-08 ENCOUNTER — Encounter: Payer: Medicare Other | Admitting: Occupational Therapy

## 2022-12-08 DIAGNOSIS — R269 Unspecified abnormalities of gait and mobility: Secondary | ICD-10-CM | POA: Diagnosis not present

## 2022-12-08 DIAGNOSIS — M6281 Muscle weakness (generalized): Secondary | ICD-10-CM

## 2022-12-08 DIAGNOSIS — R2681 Unsteadiness on feet: Secondary | ICD-10-CM

## 2022-12-08 DIAGNOSIS — R262 Difficulty in walking, not elsewhere classified: Secondary | ICD-10-CM

## 2022-12-08 NOTE — Therapy (Signed)
OUTPATIENT PHYSICAL THERAPY TREATMENT NOTE   Patient Name: Lindsey [name] MRN: [id number] DOB:Sep 05, 1956, 67 y.o., female Today's Date: 12/08/2022  PCP: Dr. Tomasa Hose MD REFERRING PROVIDER: Leeroy Cha MD   PT End of Session - 12/08/22 1134     Visit Number 48    Number of Visits 63    Date for PT Re-Evaluation 01/24/23    Authorization Type Medicare    Authorization Time Period Recert [id number]    Progress Note Due on Visit 90    PT Start Time 1104    PT Stop Time 1142    PT Time Calculation (min) 38 min    Equipment Utilized During Treatment Gait belt    Activity Tolerance Patient tolerated treatment well;No increased pain    Behavior During Therapy WFL for tasks assessed/performed                                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 ([place]) 09/24/2021   Cerebral embolism with cerebral infarction 09/23/2021   Pyelonephritis 09/19/2021   Pain due to onychomycosis of toenails of both feet 11/19/2020   Normocytic anemia 05/31/2020   Acute ischemic left MCA stroke ([place]) 04/30/2020   Right hemiplegia ([place]) 04/30/2020   Cerebrovascular accident ([place]) 04/21/2020   Atrial  fibrillation with RVR ([place]) 04/21/2020   Essential hypertension 04/21/2020   Alcohol abuse 04/21/2020    REFERRING DIAG: s/p CVA  THERAPY DIAG:  Abnormality of gait and mobility  Difficulty in walking, not elsewhere classified  Muscle weakness (generalized)  Unsteadiness on feet  Rationale for Evaluation and Treatment Rehabilitation  PERTINENT HISTORY: PTA was a short distance AMB c SPC prior to recent stroke in December 2022. Pt since using WC for primary mobility. Pt has hesitation with LLE, significantly weak RLE. Caregiver assists with all transfers. Pt has 2 level home, chair lift for 2nd level. Pt would like to improve transfers, improve Rt neglect, and progress general strength. 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 Primary impairment includes Rt hemiplegia and global aphasia. H/o prior CVA c Rt hemiplegia July 2021. PMH: AF, Renal Artery thrombosis, HTN, pyelonephritis, seizure activity, constipation, AKI, Vitamin D deficiency, and urinary incontinence.    PRECAUTIONS: fall risk, wearing RLE AFO  SUBJECTIVE:  Pt doing well today, no falls, no medical updates. No pain today.     PAIN:  Are you having pain? No   TODAY'S TREATMENT:  12/01/2022  -Step pivot transfer transport chair to plinth, no device available -SPT plinth to WC -SPT WC to plinth c HW -180 degrees step pivot transfer plinth  to WC, freeze and fatigue before ready to sit, maxA needed to return to plinth -STS from plinth c LUE HW 3x2 c minA lift off, intermittent recovery due to LOB  -SPT plinth to transport chair with HW LUE modA weight shift for LLE swing phase in stepping     PATIENT EDUCATION: Education details: Pt educated throughout session about proper posture and technique with exercises. Improved exercise technique, movement at target joints, use of target muscles after min to mod verbal, visual, tactile cues. Continued education on technique with STS.  Person  educated: Patient Education method: Explanation, Demonstration, and Verbal cues Education comprehension: verbalized understanding, returned demonstration, and needs further education   HOME EXERCISE PROGRAM: Continue as previously given;    PT Short Term Goals -       PT SHORT TERM GOAL #1   Title Patient will be independent in home exercise program to improve strength/mobility for better functional independence with ADLs.    Baseline no formal HEP for LE/balance at this time; 02/08/2022=Patient continues to require VC for reminders and assist with hip march/knee ext (seated for strengthening); Reminders to continue to work on static stand at home.    Time 4    Period Weeks    Status Partially Met  04/07/22: complete somewhat regularly     Target Date 12/14/21              PT Long Term Goals -       PT LONG TERM GOAL #1   Title Patient will increase FOTO score to equal to or greater than   41  to demonstrate statistically significant improvement in mobility and quality of life.    Baseline 16 on 2/14; 02/08/2022=Patient unable to complete today- No family present but will attempt again next visit available with family present to assist in completion; 02/17/2022= 40; 06/28/2022- No caregiver present in PT session to assist in answering questions. 08/04/2022= 40; 11/01/2022= unable to complete- caregiver received a call and not in room to assist patient today. 11/22/2022= 40   Time 12    Period Weeks    Status On-going    Target Date 01/24/2023       PT LONG TERM GOAL #3   Title Patient  will complete five times sit to stand test in < 30 seconds with UE assistance indicating an increased LE strength and improved balance.    Baseline 58.47 s with UE assist and with A from PT for R LE positioning; 02/08/2022=54 sec with use of L UE support and Min physical assist to position right LE.  7/6: 30.5 sec, min A for LE positioning, some attempts did not come to full erect posture, did not let go  with L UE from armchair with any attempt 05/05/22: 26.65 sec   Time 12    Period Weeks    Status Met    Target Date 05/03/22      PT LONG TERM GOAL #4   Title Patient will increase Berg Balance score by > 6 points to demonstrate decreased fall risk during functional activities.    Baseline 12 on 2/14; 02/08/2022= 13/56  04/07/22: 17  8/3: 19    Time 12    Period Weeks    Status Met    Target Date 05/03/22      PT LONG TERM GOAL #5   Title Pt will transfer from seated position on table/chair to/from transport chair/WC in order to indicate improved transfer ability.    Baseline Requires  Min A for LE positioning and to prevent LOB. 02/08/2022=Patient attempted to perform on her own- Unable to push herself up from chair- Very min assist to position right LE into more flexed position and VC for hand placement. Patient able to stand with min Assist with use of gait belt and VC to lean; 02/17/2022=Patient was able to transfer from transport w/c with some physical assist with Right to swing to pivot to side of chair. She was able to sit to stand today with only CGA and then stand pivot with right arm holding onto arm of chair. Only difficulty was stand to sit- Max VC not to plop into chair. 04/07/22: Pt performed movement of B LE to side of chair independently with chair transfer. Pt required min A with R LE foot placement of floor. Pt requires increased time to complete but complete with CGA only for safety and no cues or A to prevent falling/ plopping into armchair.  8/3: Pt requires min A for maintenance of standig balance, able to navigate from transport chairr to arm chair with hemiwalker, does not controll descent with ahands still " flops" into chair; 08/04/2022- Patient continues to be inconsistent in her transfers- at times only CGA and other more min Assist and repetitive VC. Unclear due to some ongoing cognitive issues if patient will achieve this goal but still presents with potential. 09/08/2022-  Patient able to stand with CGA yet continues to require initial VC for proper sequencing. 12/01/2022= Patient continues to present with inconsistent ability to stand pivot transfer - requiring min to mod assist at times   Time 12    Period Weeks    Status Partially met    Target Date 10/27/2022       PT LONG TERM GOAL #6   Title Patient will ambulate > 150 feet with CGA using Hemiwalker on level surfaces for improved household and short community distances.    Baseline 02/08/2022= 40 feet with use of HW, heavy CGA with max VC for gait sequencing and w/c follow. 5/18 = 85 feet with use of HW, close CGA  7/6: 45 feet, heavy use of hemiwalker 8/3: 85 feet with heavy hemi walker use, no Lob throughout, cues for appropriate weight distribution; 06/28/2022 - 205 feet with use of hemiwalker, CGA; 08/04/2022- Patient presents with inconsistent gait ability - able to walk 150 feet with max effort and increased time using hemiwalker and CGA. 10/18/2022= Patient ambulated around 45 feet at end of session and complained of being too fatigued to continue- very slow deliberate steps- difficulty advancing right LE; 11/01/2022- Patient ambulated approx 50 feet total today- using hemiwalker with step to gait- Mod VC for gait sequencing and close w/c follow- Decreased step height/length on affected right side. 12/01/2022- Not tested today but patient last amb 45 feet last during last    Time 12    Period Weeks    Status On-going    Target Date 01/24/2023        PT LONG TERM GOAL #7   Title Pt will increase 10MWT by at least 0.13 m/s in order to demonstrate clinically significant improvement in community ambulation.    Baseline 02/08/2022= 0.05 m/s using HW 7/6: .014ms with heavy ue use of hemiwalker and CGA for balance 8/3: .0518m 06/28/2022= 0.1 m/s (will keep goal active to ensure she is consistent); 08/04/2022- 0.09 m/s); 10/18/2021= 0.07 m/s using hemiwalker; 11/01/2022= 0.07 m/s; 12/01/2022= Not attempted today will assess  next visit   Time 12  Period Weeks    Status Ongoing     Target Date 01/24/2023      8.  Pt will improve 5X STS with full knee extension in standing each rep and with appropriate eccentric control in order to indicate improved transfer efficacy and improved LE strength  Baseline: 52.45 sec with UE support; 06/28/2022= 36.03 sec with UE support (VC for erect standing- patient continues to press up using back of legs against chair to stand); 08/04/2022- Patient required 45 sec using left UE and max VC (flopping at times with sitting); 10/18/2022 = 48 sec with Left UE and continued VC to lean forward and stand erect without falling back into chair- some improved eccentric control with last 2 reps. 11/01/2022= 39.7 sec with LE UE and CGA with VC to lean forward. 12/01/2022= attempted yet patient required min/mod assist  to recover.  Goal status: ONGOING TARGET date: 01/24/2023  9.  Patient will increase Berg Balance score by > 6 points to demonstrate decreased fall risk during functional activities Baseline: 19; 06/28/2022= 19/56; 08/04/2022- Deferred secondary to patient too fatigued to attempt after walking today. Will reassess next visit. 10/18/2022=21  Goal status: ONGOING Target Date: 01/24/2023           Plan -     Clinical Impression Statement Heavy emphasis on step pivot transfers today, technique, variability, problem solving, stamina. Pt gets stuck at times, difficult to problem solve feet and HW, requires maxA+2 for safe return to sitting. Pt follows cues well for HW placement, management of RLE in stance and stepping remain extraordinarily limited by poor LLE+LUE supported balance with limited dynacism of weight shift. Pt remains motivated, but needs frequent rest breaks.    Personal Factors and Comorbidities Age;Comorbidity 1;Comorbidity 2;Comorbidity 3+;Time since onset of injury/illness/exacerbation;Other    Comorbidities AFib, Renal Artery thrombosis, situational depression, HTN,  Pyelnephritis, Seizure activity, COnstipation, AKI    Examination-Activity Limitations Bathing;Bed Mobility;Carry;Locomotion Level;Dressing;Sit;Squat;Stairs;Stand;Toileting;Transfers    Examination-Participation Restrictions Church;Community Activity;Laundry    Stability/Clinical Decision Making Evolving/Moderate complexity    Rehab Potential Fair    PT Frequency 1-2x / week    PT Duration 12 weeks    PT Treatment/Interventions ADLs/Self Care Home Management;Gait training;Stair training;Therapeutic activities;Therapeutic exercise;Balance training;Neuromuscular re-education;Patient/family education;Wheelchair mobility training;Manual techniques;Energy conservation;Dry needling;Passive range of motion;Taping;Spinal Manipulations;Joint Manipulations    PT Next Visit Plan Gait training, Transfer training, LE strengthening as appropriate.    PT Home Exercise Plan HEP: general LE and postural exercises    Consulted and Agree with Plan of Care Patient            11:46 AM, 12/08/22 Etta Grandchild PT  Physical Therapist- [facility]    12/08/22, 11:46 AM    11:46 AM, 12/08/22 Etta Grandchild, PT, DPT Physical Therapist - [place] [phone]

## 2022-12-13 ENCOUNTER — Ambulatory Visit: Payer: Medicare Other | Admitting: Physical Therapy

## 2022-12-13 ENCOUNTER — Encounter: Payer: Medicare Other | Admitting: Occupational Therapy

## 2022-12-13 ENCOUNTER — Encounter: Payer: Medicare Other | Admitting: Speech Pathology

## 2022-12-13 ENCOUNTER — Ambulatory Visit: Payer: Medicare Other

## 2022-12-13 ENCOUNTER — Ambulatory Visit: Payer: Medicare Other | Admitting: Occupational Therapy

## 2022-12-13 DIAGNOSIS — R262 Difficulty in walking, not elsewhere classified: Secondary | ICD-10-CM

## 2022-12-13 DIAGNOSIS — R269 Unspecified abnormalities of gait and mobility: Secondary | ICD-10-CM

## 2022-12-13 DIAGNOSIS — M6281 Muscle weakness (generalized): Secondary | ICD-10-CM

## 2022-12-13 DIAGNOSIS — R2681 Unsteadiness on feet: Secondary | ICD-10-CM

## 2022-12-13 NOTE — Therapy (Signed)
OUTPATIENT PHYSICAL THERAPY TREATMENT NOTE   Patient Name: Lindsey [name] MRN: [id number] DOB:08-14-1956, 67 y.o., female Today's Date: 12/13/2022  PCP: Dr. Tomasa Hose MD REFERRING PROVIDER: Leeroy Cha MD   PT End of Session - 12/13/22 1057     Visit Number 66    Number of Visits 77    Date for PT Re-Evaluation 01/24/23    Authorization Type Medicare    Authorization Time Period Recert [id number]    Progress Note Due on Visit 90    PT Start Time 1101    PT Stop Time 1139    PT Time Calculation (min) 38 min    Equipment Utilized During Treatment Gait belt    Activity Tolerance Patient tolerated treatment well;No increased pain    Behavior During Therapy WFL for tasks assessed/performed                                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 No pertinent surgical history. 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 ([place]) 09/24/2021   Cerebral embolism with cerebral infarction 09/23/2021   Pyelonephritis 09/19/2021   Pain due to onychomycosis of toenails of both feet 11/19/2020   Normocytic anemia 05/31/2020   Acute ischemic left MCA stroke ([place]) 04/30/2020   Right hemiplegia ([place]) 04/30/2020   Cerebrovascular accident ([place]) 04/21/2020    Atrial fibrillation with RVR ([place]) 04/21/2020   Essential hypertension 04/21/2020   Alcohol abuse 04/21/2020    REFERRING DIAG: s/p CVA  THERAPY DIAG:  Abnormality of gait and mobility  Difficulty in walking, not elsewhere classified  Muscle weakness (generalized)  Unsteadiness on feet  Rationale for Evaluation and Treatment Rehabilitation  PERTINENT HISTORY: PTA was a short distance AMB c SPC prior to recent stroke in December 2022. Pt since using WC for primary mobility. Pt has hesitation with LLE, significantly weak RLE. Caregiver assists with all transfers. Pt has 2 level home, chair lift for 2nd level. Pt would like to improve transfers, improve Rt neglect, and progress general strength. 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 Primary impairment includes Rt hemiplegia and global aphasia. H/o prior CVA c Rt hemiplegia July 2021. PMH: AF, Renal Artery thrombosis, HTN, pyelonephritis, seizure activity, constipation, AKI, Vitamin D deficiency, and urinary incontinence.    PRECAUTIONS: fall risk, wearing RLE AFO  SUBJECTIVE: Pt denies falls. Reports no new concerns or complaints.    PAIN:  Are you having pain? No      TODAY'S TREATMENT:  12/13/2022  Neuro Re-Ed:  Focus of session on improving safe transfers. Pt remains regressing in gait reliant on // bars for safe mobilization needing max multimodal cuing for RLE  progression also with transfers.   Step pivot from transport chair to mat table to the R using HW on LUE. Unable to properly position feet for pivoting needing modA at RLE to pivot.   STS with HW in LUE: 1x5, 1x3 CGA. Frequent VC's for positioning LE's and RUE.  Endorses fatigue on second set requesting rest.   PT demo and review foot positioning to assist in safe SPT's with HW from mat table to chair. Performed with Chair on R side multiple reps (3-4). Min to mod multimodal cuing for correct foot positioning and safe LUE positioning to  assist in standing. With VC's pt able to position body correctly but needing minA at pelvis and maxA with RLE. In standing pt remains unable to really pivot R foot without physical assist. Needing min to modA frequently throughout.    SPT with HW in LUE mat to chair (on L side). 2 reps. Remains needing similar physical assist. Reports being complete with PT session due to fatigue. SPT from chair to transport chair without HW. HHA+1 on LUE.   Pt with reduced assistance transferring to her L side as this is her strong side. Either direction pt remains significantly limited in pivoting RLE safely without min to mod support from PT for safety.     PATIENT EDUCATION: Education details: Pt educated throughout session about proper posture and technique with exercises. Improved exercise technique, movement at target joints, use of target muscles after min to mod verbal, visual, tactile cues. Continued education on technique with STS.  Person educated: Patient Education method: Explanation, Demonstration, and Verbal cues Education comprehension: verbalized understanding, returned demonstration, and needs further education   HOME EXERCISE PROGRAM: Continue as previously given;    PT Short Term Goals -       PT SHORT TERM GOAL #1   Title Patient will be independent in home exercise program to improve strength/mobility for better functional independence with ADLs.    Baseline no formal HEP for LE/balance at this time; 02/08/2022=Patient continues to require VC for reminders and assist with hip march/knee ext (seated for strengthening); Reminders to continue to work on static stand at home.    Time 4    Period Weeks    Status Partially Met  04/07/22: complete somewhat regularly     Target Date 12/14/21              PT Long Term Goals -       PT LONG TERM GOAL #1   Title Patient will increase FOTO score to equal to or greater than   41  to demonstrate statistically significant improvement  in mobility and quality of life.    Baseline 16 on 2/14; 02/08/2022=Patient unable to complete today- No family present but will attempt again next visit available with family present to assist in completion; 02/17/2022= 40; 06/28/2022- No caregiver present in PT session to assist in answering questions. 08/04/2022= 40; 11/01/2022= unable to complete- caregiver received a call and not in room to assist patient today. 11/22/2022= 40   Time 12    Period Weeks    Status On-going    Target Date 01/24/2023       PT LONG TERM GOAL #3   Title Patient  will complete five times sit to stand test in < 30 seconds with UE assistance indicating an increased LE strength and improved balance.    Baseline 58.47 s with UE assist and with A from PT for R LE positioning; 02/08/2022=54 sec with  use of L UE support and Min physical assist to position right LE.  7/6: 30.5 sec, min A for LE positioning, some attempts did not come to full erect posture, did not let go with L UE from armchair with any attempt 05/05/22: 26.65 sec   Time 12    Period Weeks    Status Met    Target Date 05/03/22      PT LONG TERM GOAL #4   Title Patient will increase Berg Balance score by > 6 points to demonstrate decreased fall risk during functional activities.    Baseline 12 on 2/14; 02/08/2022= 13/56  04/07/22: 17  8/3: 19    Time 12    Period Weeks    Status Met    Target Date 05/03/22      PT LONG TERM GOAL #5   Title Pt will transfer from seated position on table/chair to/from transport chair/WC in order to indicate improved transfer ability.    Baseline Requires Min A for LE positioning and to prevent LOB. 02/08/2022=Patient attempted to perform on her own- Unable to push herself up from chair- Very min assist to position right LE into more flexed position and VC for hand placement. Patient able to stand with min Assist with use of gait belt and VC to lean; 02/17/2022=Patient was able to transfer from transport w/c with some physical assist  with Right to swing to pivot to side of chair. She was able to sit to stand today with only CGA and then stand pivot with right arm holding onto arm of chair. Only difficulty was stand to sit- Max VC not to plop into chair. 04/07/22: Pt performed movement of B LE to side of chair independently with chair transfer. Pt required min A with R LE foot placement of floor. Pt requires increased time to complete but complete with CGA only for safety and no cues or A to prevent falling/ plopping into armchair.  8/3: Pt requires min A for maintenance of standig balance, able to navigate from transport chairr to arm chair with hemiwalker, does not controll descent with ahands still " flops" into chair; 08/04/2022- Patient continues to be inconsistent in her transfers- at times only CGA and other more min Assist and repetitive VC. Unclear due to some ongoing cognitive issues if patient will achieve this goal but still presents with potential. 09/08/2022- Patient able to stand with CGA yet continues to require initial VC for proper sequencing. 12/01/2022= Patient continues to present with inconsistent ability to stand pivot transfer - requiring min to mod assist at times   Time 12    Period Weeks    Status Partially met    Target Date 10/27/2022       PT LONG TERM GOAL #6   Title Patient will ambulate > 150 feet with CGA using Hemiwalker on level surfaces for improved household and short community distances.    Baseline 02/08/2022= 40 feet with use of HW, heavy CGA with max VC for gait sequencing and w/c follow. 5/18 = 85 feet with use of HW, close CGA  7/6: 45 feet, heavy use of hemiwalker 8/3: 85 feet with heavy hemi walker use, no Lob throughout, cues for appropriate weight distribution; 06/28/2022 - 205 feet with use of hemiwalker, CGA; 08/04/2022- Patient presents with inconsistent gait ability - able to walk 150 feet with max effort and increased time using hemiwalker and CGA. 10/18/2022= Patient ambulated around 45  feet at end of session and complained of being too  fatigued to continue- very slow deliberate steps- difficulty advancing right LE; 11/01/2022- Patient ambulated approx 50 feet total today- using hemiwalker with step to gait- Mod VC for gait sequencing and close w/c follow- Decreased step height/length on affected right side. 12/01/2022- Not tested today but patient last amb 45 feet last during last    Time 12    Period Weeks    Status On-going    Target Date 01/24/2023        PT LONG TERM GOAL #7   Title Pt will increase 10MWT by at least 0.13 m/s in order to demonstrate clinically significant improvement in community ambulation.    Baseline 02/08/2022= 0.05 m/s using HW 7/6: .052ms with heavy ue use of hemiwalker and CGA for balance 8/3: .0549m 06/28/2022= 0.1 m/s (will keep goal active to ensure she is consistent); 08/04/2022- 0.09 m/s); 10/18/2021= 0.07 m/s using hemiwalker; 11/01/2022= 0.07 m/s; 12/01/2022= Not attempted today will assess next visit   Time 12    Period Weeks    Status Ongoing     Target Date 01/24/2023      8.  Pt will improve 5X STS with full knee extension in standing each rep and with appropriate eccentric control in order to indicate improved transfer efficacy and improved LE strength  Baseline: 52.45 sec with UE support; 06/28/2022= 36.03 sec with UE support (VC for erect standing- patient continues to press up using back of legs against chair to stand); 08/04/2022- Patient required 45 sec using left UE and max VC (flopping at times with sitting); 10/18/2022 = 48 sec with Left UE and continued VC to lean forward and stand erect without falling back into chair- some improved eccentric control with last 2 reps. 11/01/2022= 39.7 sec with LE UE and CGA with VC to lean forward. 12/01/2022= attempted yet patient required min/mod assist  to recover.  Goal status: ONGOING TARGET date: 01/24/2023  9.  Patient will increase Berg Balance score by > 6 points to demonstrate decreased fall risk  during functional activities Baseline: 19; 06/28/2022= 19/56; 08/04/2022- Deferred secondary to patient too fatigued to attempt after walking today. Will reassess next visit. 10/18/2022=21  Goal status: ONGOING Target Date: 01/24/2023           Plan -     Clinical Impression Statement Continuing PT POC with focus on SPT transfers. Deferred gait in // bars as pt with significant difficulty today with safe transfer techniques so continued heavy focus on improving transfers with mass practice to attempt to improve cognitive understanding. Pt needing mod to max VC's throughout session for positioning of all limbs in prep for safe transfers with fair to poor carryover without cuing. She needs step by step cuing to perform correctly. As described prior session, significant difficulty with pivoting and/or stepping to complete safe transfers in both directions due to her L sided hemiplegia needing min to modA with HW to safely complete. Unsure of pt's potential to improve safe SPT's. Primary PT's may want to consider slide board transfers to optimize safety and independence for facility setting. Pt will continue to benefit from skilled PT to address pt's mobility deficits to optimize safety/functional mobility to reduce falls risk and care giver burden.     Personal Factors and Comorbidities Age;Comorbidity 1;Comorbidity 2;Comorbidity 3+;Time since onset of injury/illness/exacerbation;Other    Comorbidities AFib, Renal Artery thrombosis, situational depression, HTN, Pyelnephritis, Seizure activity, COnstipation, AKI    Examination-Activity Limitations Bathing;Bed Mobility;Carry;Locomotion Level;Dressing;Sit;Squat;Stairs;Stand;Toileting;Transfers    Examination-Participation Restrictions Church;Community Activity;Laundry    Stability/Clinical  Decision Making Evolving/Moderate complexity    Rehab Potential Fair    PT Frequency 1-2x / week    PT Duration 12 weeks    PT Treatment/Interventions ADLs/Self Care  Home Management;Gait training;Stair training;Therapeutic activities;Therapeutic exercise;Balance training;Neuromuscular re-education;Patient/family education;Wheelchair mobility training;Manual techniques;Energy conservation;Dry needling;Passive range of motion;Taping;Spinal Manipulations;Joint Manipulations    PT Next Visit Plan Gait training, Transfer training, LE strengthening as appropriate.    PT Home Exercise Plan HEP: general LE and postural exercises    Consulted and Agree with Plan of Care Patient            Salem Caster. Fairly IV, PT, DPT Physical Therapist- [facility]  12/13/22, 11:58 AM

## 2022-12-15 ENCOUNTER — Encounter: Payer: Medicare Other | Admitting: Speech Pathology

## 2022-12-15 ENCOUNTER — Ambulatory Visit: Payer: Medicare Other

## 2022-12-15 ENCOUNTER — Encounter: Payer: Medicare Other | Admitting: Occupational Therapy

## 2022-12-15 ENCOUNTER — Ambulatory Visit: Payer: Medicare Other | Admitting: Physical Therapy

## 2022-12-15 DIAGNOSIS — R2681 Unsteadiness on feet: Secondary | ICD-10-CM

## 2022-12-15 DIAGNOSIS — R278 Other lack of coordination: Secondary | ICD-10-CM

## 2022-12-15 DIAGNOSIS — I63542 Cerebral infarction due to unspecified occlusion or stenosis of left cerebellar artery: Secondary | ICD-10-CM

## 2022-12-15 DIAGNOSIS — R269 Unspecified abnormalities of gait and mobility: Secondary | ICD-10-CM

## 2022-12-15 DIAGNOSIS — R262 Difficulty in walking, not elsewhere classified: Secondary | ICD-10-CM

## 2022-12-15 DIAGNOSIS — R2689 Other abnormalities of gait and mobility: Secondary | ICD-10-CM

## 2022-12-15 DIAGNOSIS — M6281 Muscle weakness (generalized): Secondary | ICD-10-CM

## 2022-12-15 NOTE — Therapy (Signed)
OUTPATIENT PHYSICAL THERAPY TREATMENT NOTE   Patient Name: Lindsey [name] MRN: [id number] DOB:07/29/1956, 67 y.o., female Today's Date: 12/15/2022  PCP: Dr. Tomasa Hose MD REFERRING PROVIDER: Leeroy Cha MD   PT End of Session - 12/15/22 1109     Visit Number 59    Number of Visits 9    Date for PT Re-Evaluation 01/24/23    Authorization Type Medicare    Authorization Time Period Recert [id number]    Progress Note Due on Visit 90    PT Start Time 1102    PT Stop Time 1143    PT Time Calculation (min) 41 min    Equipment Utilized During Treatment Gait belt    Activity Tolerance Patient tolerated treatment well;No increased pain    Behavior During Therapy WFL for tasks assessed/performed                                 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 No pertinent surgical history. 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 ([place]) 09/24/2021   Cerebral embolism with cerebral infarction 09/23/2021   Pyelonephritis 09/19/2021   Pain due to onychomycosis of toenails of both feet 11/19/2020   Normocytic anemia 05/31/2020   Acute ischemic left MCA stroke ([place]) 04/30/2020   Right hemiplegia ([place]) 04/30/2020   Cerebrovascular accident ([place]) 04/21/2020    Atrial fibrillation with RVR ([place]) 04/21/2020   Essential hypertension 04/21/2020   Alcohol abuse 04/21/2020    REFERRING DIAG: s/p CVA  THERAPY DIAG:  Abnormality of gait and mobility  Difficulty in walking, not elsewhere classified  Muscle weakness (generalized)  Unsteadiness on feet  Other lack of coordination  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 Pt since using WC for primary mobility. Pt has hesitation with LLE, significantly weak RLE. Caregiver assists with all transfers. Pt has 2 level home, chair lift for 2nd level. Pt would like to improve transfers, improve Rt neglect, and progress general strength. 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 Primary impairment includes Rt hemiplegia and global aphasia. H/o prior CVA c Rt hemiplegia July 2021. PMH: AF, Renal Artery thrombosis, HTN, pyelonephritis, seizure activity, constipation, AKI, Vitamin D deficiency, and urinary incontinence.    PRECAUTIONS: fall risk, wearing RLE AFO  SUBJECTIVE: Patient reports doing well without any significant issues.   PAIN:  Are you having pain? No      TODAY'S TREATMENT:  12/15/2022  THEREX:  Passive  hamstring and knee flex stretching - x several min  AAROM- Right hip flex- 2 sets of 15 reps- physical assist and max VC to try not to just extend right knee but lift knee up toward ceiling.   AROM- LAQ- 2 sets of 15 reps (VC to raise leg as straight as possible)   AAROM- Hip abd in seated position with leg straight- 2 sets of 15 reps  Therapeutic activities:   Sit to stand from bench with Left UE support ( Constant reminders for arm placement) - 2 sets of 10 reps- Ranging from [place] down to CGA-  inconsistent with physical effort  Stand pivot transfer from transport chair to bench to the R using HW on LUE. Unable to properly position feet for pivoting needing modA at RLE to pivot x 3 trials      SPT with HW in LUE mat to chair (on L side). 2 reps. Mod assist for each .      PATIENT EDUCATION: Education details: Pt educated throughout session about proper posture and technique with exercises. Improved exercise technique, movement at target joints, use of target muscles after min to mod verbal, visual, tactile cues. Continued education on technique with STS.  Person educated: Patient Education method: Explanation, Demonstration, and Verbal cues Education comprehension: verbalized understanding, returned demonstration, and needs further education   HOME EXERCISE PROGRAM: Continue as previously given;    PT Short Term Goals -       PT SHORT TERM GOAL #1   Title Patient will be independent in home exercise program to improve strength/mobility for better functional independence with ADLs.    Baseline no formal HEP for LE/balance at this time; 02/08/2022=Patient continues to require VC for reminders and assist with hip march/knee ext (seated for strengthening); Reminders to continue to work on static stand at home.    Time 4    Period Weeks    Status Partially Met  04/07/22: complete somewhat regularly     Target Date 12/14/21              PT Long Term Goals -       PT LONG TERM GOAL #1   Title Patient will increase FOTO score to equal to or greater than   41  to demonstrate statistically significant improvement in mobility and quality of life.    Baseline 16 on 2/14; 02/08/2022=Patient unable to complete today- No family present but will attempt again next visit available with family present to assist in completion; 02/17/2022= 40; 06/28/2022- No caregiver present in PT session to assist in answering questions. 08/04/2022= 40; 11/01/2022= unable to complete- caregiver received  a call and not in room to assist patient today. 11/22/2022= 40   Time 12    Period Weeks    Status On-going    Target Date 01/24/2023       PT LONG TERM GOAL #3   Title Patient  will complete five times sit to stand test in < 30 seconds with UE assistance indicating an increased LE strength and improved balance.    Baseline 58.47 s with UE assist and with A from PT for R LE positioning; 02/08/2022=54 sec with use of L UE support and Min physical assist to position right LE.  7/6: 30.5 sec, min A for LE positioning, some attempts did not come to full erect posture, did not let go with L UE from armchair with any attempt 05/05/22: 26.65 sec   Time 12    Period  Weeks    Status Met    Target Date 05/03/22      PT LONG TERM GOAL #4   Title Patient will increase Berg Balance score by > 6 points to demonstrate decreased fall risk during functional activities.    Baseline 12 on 2/14; 02/08/2022= 13/56  04/07/22: 17  8/3: 19    Time 12    Period Weeks    Status Met    Target Date 05/03/22      PT LONG TERM GOAL #5   Title Pt will transfer from seated position on table/chair to/from transport chair/WC in order to indicate improved transfer ability.    Baseline Requires Min A for LE positioning and to prevent LOB. 02/08/2022=Patient attempted to perform on her own- Unable to push herself up from chair- Very min assist to position right LE into more flexed position and VC for hand placement. Patient able to stand with min Assist with use of gait belt and VC to lean; 02/17/2022=Patient was able to transfer from transport w/c with some physical assist with Right to swing to pivot to side of chair. She was able to sit to stand today with only CGA and then stand pivot with right arm holding onto arm of chair. Only difficulty was stand to sit- Max VC not to plop into chair. 04/07/22: Pt performed movement of B LE to side of chair independently with chair transfer. Pt required min A with R LE foot placement of floor.  Pt requires increased time to complete but complete with CGA only for safety and no cues or A to prevent falling/ plopping into armchair.  8/3: Pt requires min A for maintenance of standig balance, able to navigate from transport chairr to arm chair with hemiwalker, does not controll descent with ahands still " flops" into chair; 08/04/2022- Patient continues to be inconsistent in her transfers- at times only CGA and other more min Assist and repetitive VC. Unclear due to some ongoing cognitive issues if patient will achieve this goal but still presents with potential. 09/08/2022- Patient able to stand with CGA yet continues to require initial VC for proper sequencing. 12/01/2022= Patient continues to present with inconsistent ability to stand pivot transfer - requiring min to mod assist at times   Time 12    Period Weeks    Status Partially met    Target Date 10/27/2022       PT LONG TERM GOAL #6   Title Patient will ambulate > 150 feet with CGA using Hemiwalker on level surfaces for improved household and short community distances.    Baseline 02/08/2022= 40 feet with use of HW, heavy CGA with max VC for gait sequencing and w/c follow. 5/18 = 85 feet with use of HW, close CGA  7/6: 45 feet, heavy use of hemiwalker 8/3: 85 feet with heavy hemi walker use, no Lob throughout, cues for appropriate weight distribution; 06/28/2022 - 205 feet with use of hemiwalker, CGA; 08/04/2022- Patient presents with inconsistent gait ability - able to walk 150 feet with max effort and increased time using hemiwalker and CGA. 10/18/2022= Patient ambulated around 45 feet at end of session and complained of being too fatigued to continue- very slow deliberate steps- difficulty advancing right LE; 11/01/2022- Patient ambulated approx 50 feet total today- using hemiwalker with step to gait- Mod VC for gait sequencing and close w/c follow- Decreased step height/length on affected right side. 12/01/2022- Not tested today but patient last  amb 45 feet last during last  Time 12    Period Weeks    Status On-going    Target Date 01/24/2023        PT LONG TERM GOAL #7   Title Pt will increase 10MWT by at least 0.13 m/s in order to demonstrate clinically significant improvement in community ambulation.    Baseline 02/08/2022= 0.05 m/s using HW 7/6: .048ms with heavy ue use of hemiwalker and CGA for balance 8/3: .0539m 06/28/2022= 0.1 m/s (will keep goal active to ensure she is consistent); 08/04/2022- 0.09 m/s); 10/18/2021= 0.07 m/s using hemiwalker; 11/01/2022= 0.07 m/s; 12/01/2022= Not attempted today will assess next visit   Time 12    Period Weeks    Status Ongoing     Target Date 01/24/2023      8.  Pt will improve 5X STS with full knee extension in standing each rep and with appropriate eccentric control in order to indicate improved transfer efficacy and improved LE strength  Baseline: 52.45 sec with UE support; 06/28/2022= 36.03 sec with UE support (VC for erect standing- patient continues to press up using back of legs against chair to stand); 08/04/2022- Patient required 45 sec using left UE and max VC (flopping at times with sitting); 10/18/2022 = 48 sec with Left UE and continued VC to lean forward and stand erect without falling back into chair- some improved eccentric control with last 2 reps. 11/01/2022= 39.7 sec with LE UE and CGA with VC to lean forward. 12/01/2022= attempted yet patient required min/mod assist  to recover.  Goal status: ONGOING TARGET date: 01/24/2023  9.  Patient will increase Berg Balance score by > 6 points to demonstrate decreased fall risk during functional activities Baseline: 19; 06/28/2022= 19/56; 08/04/2022- Deferred secondary to patient too fatigued to attempt after walking today. Will reassess next visit. 10/18/2022=21  Goal status: ONGOING Target Date: 01/24/2023           Plan -     Clinical Impression Statement Patient presents with more difficulty overall in past several visits with  right LE- presenting with usual impaired cognition with constant VC and more constant physical assist with sit to stand and transfers- patient continues to present with poor carryover with safety with transfers . She needs step by step cuing to perform correctly still from 1 rep to the next. Pt will continue to benefit from skilled PT to address pt's mobility deficits to optimize safety/functional mobility to reduce falls risk and care giver burden.     Personal Factors and Comorbidities Age;Comorbidity 1;Comorbidity 2;Comorbidity 3+;Time since onset of injury/illness/exacerbation;Other    Comorbidities AFib, Renal Artery thrombosis, situational depression, HTN, Pyelnephritis, Seizure activity, COnstipation, AKI    Examination-Activity Limitations Bathing;Bed Mobility;Carry;Locomotion Level;Dressing;Sit;Squat;Stairs;Stand;Toileting;Transfers    Examination-Participation Restrictions Church;Community Activity;Laundry    Stability/Clinical Decision Making Evolving/Moderate complexity    Rehab Potential Fair    PT Frequency 1-2x / week    PT Duration 12 weeks    PT Treatment/Interventions ADLs/Self Care Home Management;Gait training;Stair training;Therapeutic activities;Therapeutic exercise;Balance training;Neuromuscular re-education;Patient/family education;Wheelchair mobility training;Manual techniques;Energy conservation;Dry needling;Passive range of motion;Taping;Spinal Manipulations;Joint Manipulations    PT Next Visit Plan Gait training, Transfer training, LE strengthening as appropriate.    PT Home Exercise Plan HEP: general LE and postural exercises    Consulted and Agree with Plan of Care Patient            JeOllen BowlPT Physical Therapist- [facility]03/14/24, 11:53 AM

## 2022-12-19 NOTE — Therapy (Signed)
OUTPATIENT PHYSICAL THERAPY TREATMENT NOTE   Patient Name: Lindsey [name] MRN: [id number] DOB:03/04/1956, 67 y.o., female Today's Date: 12/20/2022  PCP: Dr. Tomasa Hose MD REFERRING PROVIDER: Leeroy Cha MD   PT End of Session - 12/20/22 1131     Visit Number 50    Number of Visits 44    Date for PT Re-Evaluation 01/24/23    Authorization Type Medicare    Authorization Time Period Recert [id number]    Progress Note Due on Visit 90    PT Start Time 1106    PT Stop Time 1150    PT Time Calculation (min) 44 min    Equipment Utilized During Treatment Gait belt    Activity Tolerance Patient tolerated treatment well;No increased pain    Behavior During Therapy WFL for tasks assessed/performed                                  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 No pertinent surgical history. 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 ([place]) 09/24/2021   Cerebral embolism with cerebral infarction 09/23/2021   Pyelonephritis 09/19/2021   Pain due to onychomycosis of toenails of both feet 11/19/2020   Normocytic anemia 05/31/2020   Acute ischemic left MCA stroke ([place]) 04/30/2020   Right hemiplegia ([place]) 04/30/2020   Cerebrovascular accident ([place])  04/21/2020   Atrial fibrillation with RVR ([place]) 04/21/2020   Essential hypertension 04/21/2020   Alcohol abuse 04/21/2020    REFERRING DIAG: s/p CVA  THERAPY DIAG:  Abnormality of gait and mobility  Difficulty in walking, not elsewhere classified  Muscle weakness (generalized)  Unsteadiness on feet  Other lack of coordination  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 Pt since using WC for primary mobility. Pt has hesitation with LLE, significantly weak RLE. Caregiver assists with all transfers. Pt has 2 level home, chair lift for 2nd level. Pt would like to improve transfers, improve Rt neglect, and progress general strength. 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 Primary impairment includes Rt hemiplegia and global aphasia. H/o prior CVA c Rt hemiplegia July 2021. PMH: AF, Renal Artery thrombosis, HTN, pyelonephritis, seizure activity, constipation, AKI, Vitamin D deficiency, and urinary incontinence.    PRECAUTIONS: fall risk, wearing RLE AFO  SUBJECTIVE: Patient denies any new issues. Denies any falls.    PAIN:  Are you having pain? No  TODAY'S TREATMENT:  12/20/2022  THEREX:   Supine since patient experiencing ongoing increased right LE weakness - quad sets - hold 3 sec x 10 reps -Gluteal set -Hold 3 sec x 10 reps -active R Hip IR (straight leg) as patient rest with right LE totally ext rotated  x 10 reps (minimal activation -AAROM R Hip IR (leg straight) - 2 sets of 10 Passive R hamstring, hip flex  knee flex stretching - x several min - AAROM Right LE Hip abd and AROM using slide board - 2 sets of 10 reps -AAROM R LE heel slide- 2 sets of 10  reps - Light manual resistance with R LE leg - 2 sets of 10 reps     Therapeutic activities:   Sit to stand from transport chair with Left UE support ( Constant reminders for arm placement) - Mod assist   SPT with HW in LUE mat to chair (on L side). 2 reps. Mod assist for each .      PATIENT EDUCATION: Education details: Pt educated throughout session about proper posture and technique with exercises. Improved exercise technique, movement at target joints, use of target muscles after min to mod verbal, visual, tactile cues. Continued education on technique with STS.  Person educated: Patient Education method: Explanation, Demonstration, and Verbal cues Education comprehension: verbalized understanding, returned demonstration, and needs further education   HOME EXERCISE PROGRAM: Continue as previously given;    PT Short Term Goals -       PT SHORT TERM GOAL #1   Title Patient will be independent in home exercise program to improve strength/mobility for better functional independence with ADLs.    Baseline no formal HEP for LE/balance at this time; 02/08/2022=Patient continues to require VC for reminders and assist with hip march/knee ext (seated for strengthening); Reminders to continue to work on static stand at home.    Time 4    Period Weeks    Status Partially Met  04/07/22: complete somewhat regularly     Target Date 12/14/21              PT Long Term Goals -       PT LONG TERM GOAL #1   Title Patient will increase FOTO score to equal to or greater than   41  to demonstrate statistically significant improvement in mobility and quality of life.    Baseline 16 on 2/14; 02/08/2022=Patient unable to complete today- No family present but will attempt again next visit available with family present to assist in completion; 02/17/2022= 40; 06/28/2022- No caregiver present in PT session to assist in answering questions. 08/04/2022= 40; 11/01/2022= unable to complete- caregiver  received a call and not in room to assist patient today. 11/22/2022= 40   Time 12    Period Weeks    Status On-going    Target Date 01/24/2023       PT LONG TERM GOAL #3   Title Patient  will complete five times sit to stand test in < 30 seconds with UE assistance indicating an increased LE strength and improved balance.    Baseline 58.47 s with UE assist and with A from PT for R LE positioning; 02/08/2022=54 sec with use of L UE support and Min physical assist to position right LE.  7/6: 30.5 sec, min A for LE positioning, some attempts did not come to full erect posture, did not let go with L UE from armchair with any attempt 05/05/22: 26.65 sec   Time  12    Period Weeks    Status Met    Target Date 05/03/22      PT LONG TERM GOAL #4   Title Patient will increase Berg Balance score by > 6 points to demonstrate decreased fall risk during functional activities.    Baseline 12 on 2/14; 02/08/2022= 13/56  04/07/22: 17  8/3: 19    Time 12    Period Weeks    Status Met    Target Date 05/03/22      PT LONG TERM GOAL #5   Title Pt will transfer from seated position on table/chair to/from transport chair/WC in order to indicate improved transfer ability.    Baseline Requires Min A for LE positioning and to prevent LOB. 02/08/2022=Patient attempted to perform on her own- Unable to push herself up from chair- Very min assist to position right LE into more flexed position and VC for hand placement. Patient able to stand with min Assist with use of gait belt and VC to lean; 02/17/2022=Patient was able to transfer from transport w/c with some physical assist with Right to swing to pivot to side of chair. She was able to sit to stand today with only CGA and then stand pivot with right arm holding onto arm of chair. Only difficulty was stand to sit- Max VC not to plop into chair. 04/07/22: Pt performed movement of B LE to side of chair independently with chair transfer. Pt required min A with R LE foot placement  of floor. Pt requires increased time to complete but complete with CGA only for safety and no cues or A to prevent falling/ plopping into armchair.  8/3: Pt requires min A for maintenance of standig balance, able to navigate from transport chairr to arm chair with hemiwalker, does not controll descent with ahands still " flops" into chair; 08/04/2022- Patient continues to be inconsistent in her transfers- at times only CGA and other more min Assist and repetitive VC. Unclear due to some ongoing cognitive issues if patient will achieve this goal but still presents with potential. 09/08/2022- Patient able to stand with CGA yet continues to require initial VC for proper sequencing. 12/01/2022= Patient continues to present with inconsistent ability to stand pivot transfer - requiring min to mod assist at times   Time 12    Period Weeks    Status Partially met    Target Date 10/27/2022       PT LONG TERM GOAL #6   Title Patient will ambulate > 150 feet with CGA using Hemiwalker on level surfaces for improved household and short community distances.    Baseline 02/08/2022= 40 feet with use of HW, heavy CGA with max VC for gait sequencing and w/c follow. 5/18 = 85 feet with use of HW, close CGA  7/6: 45 feet, heavy use of hemiwalker 8/3: 85 feet with heavy hemi walker use, no Lob throughout, cues for appropriate weight distribution; 06/28/2022 - 205 feet with use of hemiwalker, CGA; 08/04/2022- Patient presents with inconsistent gait ability - able to walk 150 feet with max effort and increased time using hemiwalker and CGA. 10/18/2022= Patient ambulated around 45 feet at end of session and complained of being too fatigued to continue- very slow deliberate steps- difficulty advancing right LE; 11/01/2022- Patient ambulated approx 50 feet total today- using hemiwalker with step to gait- Mod VC for gait sequencing and close w/c follow- Decreased step height/length on affected right side. 12/01/2022- Not tested today but  patient last amb  45 feet last during last    Time 12    Period Weeks    Status On-going    Target Date 01/24/2023        PT LONG TERM GOAL #7   Title Pt will increase 10MWT by at least 0.13 m/s in order to demonstrate clinically significant improvement in community ambulation.    Baseline 02/08/2022= 0.05 m/s using HW 7/6: .036m/s with heavy ue use of hemiwalker and CGA for balance 8/3: .045m/s 06/28/2022= 0.1 m/s (will keep goal active to ensure she is consistent); 08/04/2022- 0.09 m/s); 10/18/2021= 0.07 m/s using hemiwalker; 11/01/2022= 0.07 m/s; 12/01/2022= Not attempted today will assess next visit   Time 12    Period Weeks    Status Ongoing     Target Date 01/24/2023      8.  Pt will improve 5X STS with full knee extension in standing each rep and with appropriate eccentric control in order to indicate improved transfer efficacy and improved LE strength  Baseline: 52.45 sec with UE support; 06/28/2022= 36.03 sec with UE support (VC for erect standing- patient continues to press up using back of legs against chair to stand); 08/04/2022- Patient required 45 sec using left UE and max VC (flopping at times with sitting); 10/18/2022 = 48 sec with Left UE and continued VC to lean forward and stand erect without falling back into chair- some improved eccentric control with last 2 reps. 11/01/2022= 39.7 sec with LE UE and CGA with VC to lean forward. 12/01/2022= attempted yet patient required min/mod assist  to recover.  Goal status: ONGOING TARGET date: 01/24/2023  9.  Patient will increase Berg Balance score by > 6 points to demonstrate decreased fall risk during functional activities Baseline: 19; 06/28/2022= 19/56; 08/04/2022- Deferred secondary to patient too fatigued to attempt after walking today. Will reassess next visit. 10/18/2022=21  Goal status: ONGOING Target Date: 01/24/2023           Plan -     Clinical Impression Statement Patient continues to present with right LE weakness with  difficulty with transfers. Focused today on basic supine therex as patient has regressed with overall right LE strength. She required more physical assist with exercises that she used to perform with more active mobility. She appeared frustrated today but did not verbalize any concerns other than fatigue.  Pt will continue to benefit from skilled PT to address pt's mobility deficits to optimize safety/functional mobility to reduce falls risk and care giver burden.     Personal Factors and Comorbidities Age;Comorbidity 1;Comorbidity 2;Comorbidity 3+;Time since onset of injury/illness/exacerbation;Other    Comorbidities AFib, Renal Artery thrombosis, situational depression, HTN, Pyelnephritis, Seizure activity, COnstipation, AKI    Examination-Activity Limitations Bathing;Bed Mobility;Carry;Locomotion Level;Dressing;Sit;Squat;Stairs;Stand;Toileting;Transfers    Examination-Participation Restrictions Church;Community Activity;Laundry    Stability/Clinical Decision Making Evolving/Moderate complexity    Rehab Potential Fair    PT Frequency 1-2x / week    PT Duration 12 weeks    PT Treatment/Interventions ADLs/Self Care Home Management;Gait training;Stair training;Therapeutic activities;Therapeutic exercise;Balance training;Neuromuscular re-education;Patient/family education;Wheelchair mobility training;Manual techniques;Energy conservation;Dry needling;Passive range of motion;Taping;Spinal Manipulations;Joint Manipulations    PT Next Visit Plan Gait training, Transfer training, LE strengthening as appropriate.    PT Home Exercise Plan HEP: general LE and postural exercises    Consulted and Agree with Plan of Care Patient            Ollen Bowl, PT Physical Therapist- [facility]  12/20/22, 4:35 PM

## 2022-12-20 ENCOUNTER — Ambulatory Visit: Payer: Medicare Other | Admitting: Occupational Therapy

## 2022-12-20 ENCOUNTER — Ambulatory Visit: Payer: Medicare Other

## 2022-12-20 DIAGNOSIS — R278 Other lack of coordination: Secondary | ICD-10-CM

## 2022-12-20 DIAGNOSIS — I63542 Cerebral infarction due to unspecified occlusion or stenosis of left cerebellar artery: Secondary | ICD-10-CM

## 2022-12-20 DIAGNOSIS — I63512 Cerebral infarction due to unspecified occlusion or stenosis of left middle cerebral artery: Secondary | ICD-10-CM

## 2022-12-20 DIAGNOSIS — R269 Unspecified abnormalities of gait and mobility: Secondary | ICD-10-CM

## 2022-12-20 DIAGNOSIS — R2681 Unsteadiness on feet: Secondary | ICD-10-CM

## 2022-12-20 DIAGNOSIS — R2689 Other abnormalities of gait and mobility: Secondary | ICD-10-CM

## 2022-12-20 DIAGNOSIS — R262 Difficulty in walking, not elsewhere classified: Secondary | ICD-10-CM

## 2022-12-20 DIAGNOSIS — M6281 Muscle weakness (generalized): Secondary | ICD-10-CM

## 2022-12-27 ENCOUNTER — Ambulatory Visit: Payer: Medicare Other

## 2022-12-27 DIAGNOSIS — R2681 Unsteadiness on feet: Secondary | ICD-10-CM

## 2022-12-27 DIAGNOSIS — R269 Unspecified abnormalities of gait and mobility: Secondary | ICD-10-CM | POA: Diagnosis not present

## 2022-12-27 DIAGNOSIS — R278 Other lack of coordination: Secondary | ICD-10-CM

## 2022-12-27 DIAGNOSIS — R2689 Other abnormalities of gait and mobility: Secondary | ICD-10-CM

## 2022-12-27 DIAGNOSIS — M6281 Muscle weakness (generalized): Secondary | ICD-10-CM

## 2022-12-27 DIAGNOSIS — I63542 Cerebral infarction due to unspecified occlusion or stenosis of left cerebellar artery: Secondary | ICD-10-CM

## 2022-12-27 DIAGNOSIS — R262 Difficulty in walking, not elsewhere classified: Secondary | ICD-10-CM

## 2022-12-27 NOTE — Therapy (Signed)
OUTPATIENT PHYSICAL THERAPY TREATMENT NOTE   Patient Name: Lindsey [name] MRN: [id number] DOB:[birth year], 67 y.o., female Today's Date: 12/27/2022  PCP: Dr. Tomasa Hose MD REFERRING PROVIDER: Leeroy Cha MD   PT End of Session - 12/27/22 1106     Visit Number 52    Number of Visits 68    Date for PT Re-Evaluation 01/24/23    Authorization Type Medicare    Authorization Time Period Recert [id number]    Progress Note Due on Visit 90    PT Start Time 1103    PT Stop Time 1143    PT Time Calculation (min) 40 min    Equipment Utilized During Treatment Gait belt    Activity Tolerance Patient tolerated treatment well;No increased pain    Behavior During Therapy WFL for tasks assessed/performed                                  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 No pertinent surgical history. 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 ([place]) 09/24/2021   Cerebral embolism with cerebral infarction 09/23/2021   Pyelonephritis 09/19/2021   Pain due to onychomycosis of toenails of both feet 11/19/2020   Normocytic anemia 05/31/2020   Acute ischemic left MCA stroke ([place]) 04/30/2020   Right hemiplegia ([place]) 04/30/2020   Cerebrovascular accident ([place])  04/21/2020   Atrial fibrillation with RVR ([place]) 04/21/2020   Essential hypertension 04/21/2020   Alcohol abuse 04/21/2020    REFERRING DIAG: s/p CVA  THERAPY DIAG:  Abnormality of gait and mobility  Difficulty in walking, not elsewhere classified  Muscle weakness (generalized)  Unsteadiness on feet  Other lack of coordination  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 Pt since using WC for primary mobility. Pt has hesitation with LLE, significantly weak RLE. Caregiver assists with all transfers. Pt has 2 level home, chair lift for 2nd level. Pt would like to improve transfers, improve Rt neglect, and progress general strength. 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 Primary impairment includes Rt hemiplegia and global aphasia. H/o prior CVA c Rt hemiplegia July 2021. PMH: AF, Renal Artery thrombosis, HTN, pyelonephritis, seizure activity, constipation, AKI, Vitamin D deficiency, and urinary incontinence.    PRECAUTIONS: fall risk, wearing RLE AFO  SUBJECTIVE: Patient reports her right hand is sore and no brace today but states otherwise doing okay- No falls   PAIN:  Are you having pain? No  TODAY'S TREATMENT:  12/27/2022  Therapeutic activities: Performed at support bar today - CGA to min assist and use of gait belt for safety.   Sit to stand from  chair with Left UE support ( Constant reminders for arm placement) - Mod assist x 10   Static standing at support bar with left UE support  x 5 min attempting to place 50% WB through each LE  Dynamic lateral weight shifting left to right with some hyperextension going through Right LE  Dynamic lateral walk from one end of support bar to the left  initally then to the right- with 1 seated rest break  Dynamic marching in place with left LE while standing on right   Static stand without Left UE support x multiple attempts between 2-9 sec - difficulty standing erect.       THEREX:   Supine since patient experiencing ongoing increased right LE weakness - quad sets - hold 3 sec x 10 reps -Gluteal set -Hold 3 sec x 10 reps -active R Hip IR (straight leg) as patient rest with right LE totally ext rotated  x 10 reps (minimal activation -AAROM R Hip IR (leg straight) - 2 sets of 10 Passive R hamstring, hip flex  knee flex stretching - x several min - AAROM Right LE Hip abd and AROM using slide board - 2 sets of 10 reps -AAROM R LE heel slide- 2 sets of 10 reps - Light manual resistance with R LE leg - 2 sets of 10 reps        PATIENT EDUCATION: Education details: Pt educated throughout session about proper posture and technique with exercises. Improved exercise technique, movement at target joints, use of target muscles after min to mod verbal, visual, tactile cues. Continued education on technique with STS.  Person educated: Patient Education method: Explanation, Demonstration, and Verbal cues Education comprehension: verbalized understanding, returned demonstration, and needs further education   HOME EXERCISE PROGRAM: Continue as previously given;    PT Short Term Goals -       PT SHORT TERM GOAL #1   Title Patient will be independent in home exercise program to improve strength/mobility for better functional independence with ADLs.    Baseline no formal HEP for LE/balance at this time; 02/08/2022=Patient continues to require VC for reminders and assist with hip march/knee ext (seated for strengthening); Reminders to continue to work on static stand at home.    Time 4    Period Weeks    Status Partially Met  04/07/22: complete somewhat regularly     Target Date 12/14/21              PT Long Term Goals -        PT LONG TERM GOAL #1   Title Patient will increase FOTO score to equal to or greater than   41  to demonstrate statistically significant improvement in mobility and quality of life.    Baseline 16 on 2/14; 02/08/2022=Patient unable to complete today- No family present but will attempt again next visit available with family present to assist in completion; 02/17/2022= 40; 06/28/2022- No caregiver present in PT session to assist in answering questions. 08/04/2022= 40; 11/01/2022= unable to complete- caregiver received a call and not in room to assist patient today. 11/22/2022= 40   Time 12    Period Weeks    Status On-going    Target Date 01/24/2023       PT LONG TERM GOAL #3   Title Patient  will complete five times sit to stand test in < 30 seconds with UE assistance indicating an increased LE strength and improved balance.    Baseline 58.47 s with UE assist and with A from PT for R LE positioning; 02/08/2022=54 sec with use of L UE support and Min physical assist to position right LE.  7/6: 30.5 sec, min A for LE positioning, some attempts did not come to full erect posture, did not let go with L UE from armchair with any attempt 05/05/22: 26.65 sec   Time 12    Period Weeks    Status Met    Target Date 05/03/22      PT LONG TERM GOAL #4   Title Patient will increase Berg Balance score by > 6 points to demonstrate decreased fall risk during functional activities.    Baseline 12 on 2/14; 02/08/2022= 13/56  04/07/22: 17  8/3: 19    Time 12    Period Weeks    Status Met    Target Date 05/03/22      PT LONG TERM GOAL #5   Title Pt will transfer from seated position on table/chair to/from transport chair/WC in order to indicate improved transfer ability.    Baseline Requires Min A for LE positioning and to prevent LOB. 02/08/2022=Patient attempted to perform on her own- Unable to push herself up from chair- Very min assist to position right LE into more flexed position and VC for hand placement. Patient  able to stand with min Assist with use of gait belt and VC to lean; 02/17/2022=Patient was able to transfer from transport w/c with some physical assist with Right to swing to pivot to side of chair. She was able to sit to stand today with only CGA and then stand pivot with right arm holding onto arm of chair. Only difficulty was stand to sit- Max VC not to plop into chair. 04/07/22: Pt performed movement of B LE to side of chair independently with chair transfer. Pt required min A with R LE foot placement of floor. Pt requires increased time to complete but complete with CGA only for safety and no cues or A to prevent falling/ plopping into armchair.  8/3: Pt requires min A for maintenance of standig balance, able to navigate from transport chairr to arm chair with hemiwalker, does not controll descent with ahands still " flops" into chair; 08/04/2022- Patient continues to be inconsistent in her transfers- at times only CGA and other more min Assist and repetitive VC. Unclear due to some ongoing cognitive issues if patient will achieve this goal but still presents with potential. 09/08/2022- Patient able to stand with CGA yet continues to require initial VC for proper sequencing. 12/01/2022= Patient continues to present with inconsistent ability to stand pivot transfer - requiring min to mod assist at times   Time 12    Period Weeks    Status Partially met    Target Date 10/27/2022       PT LONG TERM GOAL #6   Title Patient will ambulate > 150 feet with CGA using Hemiwalker on level surfaces for improved household and short community distances.    Baseline 02/08/2022= 40 feet with use of HW, heavy CGA with max VC for gait sequencing and w/c follow. 5/18 = 85 feet with use of HW, close CGA  7/6: 45 feet, heavy use of hemiwalker 8/3: 85 feet with heavy hemi walker use, no Lob throughout, cues for appropriate weight distribution; 06/28/2022 - 205 feet  with use of hemiwalker, CGA; 08/04/2022- Patient presents with  inconsistent gait ability - able to walk 150 feet with max effort and increased time using hemiwalker and CGA. 10/18/2022= Patient ambulated around 45 feet at end of session and complained of being too fatigued to continue- very slow deliberate steps- difficulty advancing right LE; 11/01/2022- Patient ambulated approx 50 feet total today- using hemiwalker with step to gait- Mod VC for gait sequencing and close w/c follow- Decreased step height/length on affected right side. 12/01/2022- Not tested today but patient last amb 45 feet last during last    Time 12    Period Weeks    Status On-going    Target Date 01/24/2023        PT LONG TERM GOAL #7   Title Pt will increase 10MWT by at least 0.13 m/s in order to demonstrate clinically significant improvement in community ambulation.    Baseline 02/08/2022= 0.05 m/s using HW 7/6: .049m/s with heavy ue use of hemiwalker and CGA for balance 8/3: .012m/s 06/28/2022= 0.1 m/s (will keep goal active to ensure she is consistent); 08/04/2022- 0.09 m/s); 10/18/2021= 0.07 m/s using hemiwalker; 11/01/2022= 0.07 m/s; 12/01/2022= Not attempted today will assess next visit   Time 12    Period Weeks    Status Ongoing     Target Date 01/24/2023      8.  Pt will improve 5X STS with full knee extension in standing each rep and with appropriate eccentric control in order to indicate improved transfer efficacy and improved LE strength  Baseline: 52.45 sec with UE support; 06/28/2022= 36.03 sec with UE support (VC for erect standing- patient continues to press up using back of legs against chair to stand); 08/04/2022- Patient required 45 sec using left UE and max VC (flopping at times with sitting); 10/18/2022 = 48 sec with Left UE and continued VC to lean forward and stand erect without falling back into chair- some improved eccentric control with last 2 reps. 11/01/2022= 39.7 sec with LE UE and CGA with VC to lean forward. 12/01/2022= attempted yet patient required min/mod assist  to  recover.  Goal status: ONGOING TARGET date: 01/24/2023  9.  Patient will increase Berg Balance score by > 6 points to demonstrate decreased fall risk during functional activities Baseline: 19; 06/28/2022= 19/56; 08/04/2022- Deferred secondary to patient too fatigued to attempt after walking today. Will reassess next visit. 10/18/2022=21  Goal status: ONGOING Target Date: 01/24/2023           Plan -     Clinical Impression Statement Treatment focused on functional standing today and patient struggled to stand erect - unable to stand without left UE support > 10 sec at a time. She exhibited some right LE hyperextension when she was able to shift her weight over. She fatigued quickly with multiple stand and continues to be inconsistent due to some impaired cognition. Pt will continue to benefit from skilled PT to address pt's mobility deficits to optimize safety/functional mobility to reduce falls risk and care giver burden.     Personal Factors and Comorbidities Age;Comorbidity 1;Comorbidity 2;Comorbidity 3+;Time since onset of injury/illness/exacerbation;Other    Comorbidities AFib, Renal Artery thrombosis, situational depression, HTN, Pyelnephritis, Seizure activity, COnstipation, AKI    Examination-Activity Limitations Bathing;Bed Mobility;Carry;Locomotion Level;Dressing;Sit;Squat;Stairs;Stand;Toileting;Transfers    Examination-Participation Restrictions Church;Community Activity;Laundry    Stability/Clinical Decision Making Evolving/Moderate complexity    Rehab Potential Fair    PT Frequency 1-2x / week    PT Duration 12 weeks    PT  Treatment/Interventions ADLs/Self Care Home Management;Gait training;Stair training;Therapeutic activities;Therapeutic exercise;Balance training;Neuromuscular re-education;Patient/family education;Wheelchair mobility training;Manual techniques;Energy conservation;Dry needling;Passive range of motion;Taping;Spinal Manipulations;Joint Manipulations    PT Next Visit  Plan Gait training, Transfer training, LE strengthening as appropriate.    PT Home Exercise Plan HEP: general LE and postural exercises    Consulted and Agree with Plan of Care Patient            Ollen Bowl, PT Physical Therapist- [facility]  12/27/22, 3:00 PM

## 2022-12-29 ENCOUNTER — Ambulatory Visit: Payer: Medicare Other

## 2023-01-02 ENCOUNTER — Emergency Department: Payer: Medicare Other

## 2023-01-02 ENCOUNTER — Emergency Department
Admission: EM | Admit: 2023-01-02 | Discharge: 2023-01-02 | Disposition: A | Discharge status: Skilled Nursing Facility | Payer: Medicare Other | Attending: Emergency Medicine | Admitting: Emergency Medicine

## 2023-01-02 ENCOUNTER — Encounter: Payer: Self-pay | Admitting: Intensive Care

## 2023-01-02 ENCOUNTER — Other Ambulatory Visit: Payer: Self-pay

## 2023-01-02 DIAGNOSIS — W01198A Fall on same level from slipping, tripping and stumbling with subsequent striking against other object, initial encounter: Secondary | ICD-10-CM | POA: Diagnosis not present

## 2023-01-02 DIAGNOSIS — S0083XA Contusion of other part of head, initial encounter: Secondary | ICD-10-CM | POA: Insufficient documentation

## 2023-01-02 DIAGNOSIS — I10 Essential (primary) hypertension: Secondary | ICD-10-CM | POA: Insufficient documentation

## 2023-01-02 DIAGNOSIS — Z8673 Personal history of transient ischemic attack (TIA), and cerebral infarction without residual deficits: Secondary | ICD-10-CM | POA: Insufficient documentation

## 2023-01-02 DIAGNOSIS — Z7901 Long term (current) use of anticoagulants: Secondary | ICD-10-CM | POA: Insufficient documentation

## 2023-01-02 NOTE — ED Provider Notes (Signed)
[facility] Provider Note    Event Date/Time   First MD Initiated Contact with Patient 01/02/23 1548     (approximate)   History   Fall   HPI  Lindsey [name] is a 67 y.o. female with past medical history of left MCA stroke on Coumadin who presents with a fall.  3 days ago patient was at her nursing facility when apparently she lost balance and they are transferring her and she did fall and hit her head.  Patient has difficulty describing the event.  Tells me she fell yesterday but family ember is with her says that they were with her yesterday and she did not fall it happened on Friday.  Yesterday they noticed the right side of her face was swollen so they are here today to have her evaluated.  Patient does endorse some right-sided pain on her face denies any neck pain vision change or any new neurologic symptoms.  She has right hemiparesis at baseline.  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 09/24/2021   Cerebral embolism with cerebral infarction 09/23/2021   Pyelonephritis 09/19/2021   Pain due to onychomycosis of toenails of both feet 11/19/2020   Normocytic anemia 05/31/2020   Acute ischemic left MCA stroke  04/30/2020   Right hemiplegia 04/30/2020   Cerebrovascular accident 04/21/2020   Atrial fibrillation with RVR 04/21/2020   Essential hypertension 04/21/2020   Alcohol abuse 04/21/2020     Physical Exam  Triage Vital Signs: ED Triage Vitals  Enc Vitals Group     BP 01/02/23 1523 (!) 148/95     Pulse Rate 01/02/23 1523 72     Resp 01/02/23 1523 16     Temp 01/02/23 1523 98.2 F (36.8 C)     Temp Source 01/02/23 1523 Oral     SpO2 01/02/23 1523 100 %     Weight 01/02/23 1527 155 lb (70.3 kg)     Height 01/02/23 1527 5\' 7"  (1.702 m)     Head Circumference --      Peak Flow --      Pain Score 01/02/23 1525 0     Pain Loc --      Pain Edu? --      Excl. in [place]? --     Most recent vital signs: Vitals:   01/02/23 1523 01/02/23 1831  BP: (!) 148/95   Pulse: 72   Resp: 16 18  Temp: 98.2 F (36.8 C)   SpO2: 100% 100%     General: Awake, no distress.  CV:  Good peripheral perfusion.  Resp:  Normal effort.  Abd:  No distention.  Neuro:  Awake, Alert, Oriented x 3  Other:  Tenderness to palpation over the right cheek with some swelling, no trismus EOMI, pupils equal round reactive No C-spine tenderness   ED Results / Procedures / Treatments  Labs (all labs ordered are listed, but only abnormal results are displayed) Labs Reviewed - No data to display    EKG     RADIOLOGY    PROCEDURES:  Critical Care performed: No  Procedures   MEDICATIONS ORDERED IN ED: Medications - No data to display   IMPRESSION / MDM / [place] / ED COURSE  I reviewed the triage vital signs and the nursing notes.                              Patient's presentation is most consistent with acute presentation with potential threat to life or bodily function.  Differential diagnosis includes, but is not limited to, intracranial hemorrhage, subdural, facial bone fracture, concussion, soft tissue swelling  The patient is a 67 year old female on Coumadin  history of left MCA stroke who presents with facial swelling after a fall.  3 days ago she had a fall when she was being transferred at her nursing facility.  Facility did let family know about this fall, which he was not evaluated in the healthcare setting.  Yesterday family noticed the right side of her face seem to be swollen which is why they take her to the ED today.  Patient is denying headache but does endorse facial pain.  Denies neck pain or any new neurologic symptoms.  Patient is mildly hypertensive vitals are otherwise reassuring.  She has right-sided hemiparesis which is her baseline.  Does have some tenderness over the right cheekbone with some swelling extraocular movements are full pupils are reactive no trismus.  No C-spine tenderness.  CT head was obtained from triage.  I also added on a CT max face.  I had ordered labs including an INR given she is on Coumadin.  She was a difficult stick and family asked if we could avoid the labs.  If all has been today I would have wanted the INR to assess for risk of delayed bleeding however because a CT is 72 hours after the fall I think we can safely avoid checking the INR.  CT max face shows just a contusion over the right side of the face but no facial fracture.  Given his workup without any other acute symptoms she can safely be discharged.       FINAL CLINICAL IMPRESSION(S) / ED DIAGNOSES   Final diagnoses:  Contusion of face, initial encounter     Rx / DC Orders   ED Discharge Orders     None        Note:  This document was prepared using Dragon voice recognition software and may include unintentional dictation errors.   Rada Hay, MD 01/02/23 [id number]

## 2023-01-02 NOTE — ED Notes (Signed)
[facility] notified of d/c. Home by grandson's car.

## 2023-01-02 NOTE — Discharge Instructions (Signed)
The CAT scan of your head and face did not show any injury to your brain or bleeding or broken bones.  You have swelling on the side of the face.  If you develop any new symptoms that are concerning to you you can return to emergency department.

## 2023-01-02 NOTE — ED Notes (Signed)
Unsuccessful attempt at blood draw. Float RN called to attempt

## 2023-01-02 NOTE — ED Triage Notes (Signed)
Patient arrived by [facility] from [facility]. Friday had a fall, hitting forehead and was not evaluated. Family wanted her to be seen today for fall.   Takes warfarin daily.  Normal baseline A&O x2  History of stroke with deficits to right side  EMS vitals: 158/80 b/p 80HR 99% RA 148CBG 97.7 oral

## 2023-01-03 ENCOUNTER — Ambulatory Visit: Payer: Medicare Other | Attending: Physical Medicine and Rehabilitation

## 2023-01-03 DIAGNOSIS — I63542 Cerebral infarction due to unspecified occlusion or stenosis of left cerebellar artery: Secondary | ICD-10-CM

## 2023-01-03 DIAGNOSIS — R278 Other lack of coordination: Secondary | ICD-10-CM | POA: Diagnosis present

## 2023-01-03 DIAGNOSIS — R269 Unspecified abnormalities of gait and mobility: Secondary | ICD-10-CM

## 2023-01-03 DIAGNOSIS — M6281 Muscle weakness (generalized): Secondary | ICD-10-CM

## 2023-01-03 DIAGNOSIS — R2681 Unsteadiness on feet: Secondary | ICD-10-CM

## 2023-01-03 DIAGNOSIS — R482 Apraxia: Secondary | ICD-10-CM | POA: Diagnosis present

## 2023-01-03 DIAGNOSIS — R2689 Other abnormalities of gait and mobility: Secondary | ICD-10-CM | POA: Diagnosis present

## 2023-01-03 DIAGNOSIS — I63512 Cerebral infarction due to unspecified occlusion or stenosis of left middle cerebral artery: Secondary | ICD-10-CM | POA: Diagnosis present

## 2023-01-03 DIAGNOSIS — R262 Difficulty in walking, not elsewhere classified: Secondary | ICD-10-CM

## 2023-01-03 NOTE — Therapy (Signed)
OUTPATIENT PHYSICAL THERAPY TREATMENT NOTE   Patient Name: Lindsey [name] MRN: [id number] DOB:04-Apr-1956, 67 y.o., female Today's Date: 01/03/2023  PCP: Dr. Tomasa Hose MD REFERRING PROVIDER: Leeroy Cha MD   PT End of Session - 01/03/23 1112     Visit Number 53    Number of Visits 47    Date for PT Re-Evaluation 01/24/23    Authorization Type Medicare    Authorization Time Period Recert [id number]    Progress Note Due on Visit 90    PT Start Time 1057    PT Stop Time 1140    PT Time Calculation (min) 43 min    Equipment Utilized During Treatment Gait belt    Activity Tolerance Patient tolerated treatment well;No increased pain    Behavior During Therapy WFL for tasks assessed/performed                                  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 No pertinent surgical history. 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 09/24/2021   Cerebral embolism with cerebral infarction 09/23/2021   Pyelonephritis 09/19/2021   Pain due to onychomycosis of toenails of both feet 11/19/2020   Normocytic anemia 05/31/2020   Acute ischemic left MCA stroke 04/30/2020   Right hemiplegia 04/30/2020   Cerebrovascular accident 04/21/2020   Atrial fibrillation with RVR 04/21/2020    Essential hypertension 04/21/2020   Alcohol abuse 04/21/2020    REFERRING DIAG: s/p CVA  THERAPY DIAG:  Abnormality of gait and mobility  Difficulty in walking, not elsewhere classified  Muscle weakness (generalized)  Unsteadiness on feet  Other lack of coordination  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 Pt since using WC for primary mobility. Pt has hesitation with LLE, significantly weak RLE. Caregiver assists with all transfers. Pt has 2 level home, chair lift for 2nd level. Pt would like to improve transfers, improve Rt neglect, and progress general strength. 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 Primary impairment includes Rt hemiplegia and global aphasia. H/o prior CVA c Rt hemiplegia July 2021. PMH: AF, Renal Artery thrombosis, HTN, pyelonephritis, seizure activity, constipation, AKI, Vitamin D deficiency, and urinary incontinence.    PRECAUTIONS: fall risk, wearing RLE AFO  SUBJECTIVE: Patient states no complaints today- No caregiver present in waiting room or during visit.   PAIN:  Are you having pain? No      TODAY'S TREATMENT:  01/03/2023  Therex:   Seated PROM to right knee and ext x 6 min total  Seated AAROM Right knee flexion 2 sets of 12 reps Seated AROM Right knee ext - 2 sets of 12 reps - (Partial ROM Seated resistive ham curl on left (RTB) 2 sets of 12 reps Seated resistive knee ext on left (2.5#) AW- 2 sets of 12 reps        Therapeutic activities: CGA to mod assist  with activities and use of gait belt for safety.   Sit to stand from  chair with Left UE support ( Constant reminders for arm placement) - Mod assist x 5.    Static standing from armed chair holding onto hemiwalker with left UE support  x approx 3 min  attempting to place 50% WB through each LE and VC to stand erect. Patient able to return demonstration well and maintain good weight bearing through Right LE  Dynamic lateral weight shifting left to right x 25 reps   GAIT:   Patient ambulated 8 feet with hemiwalker, CGA with repetitive VC for gait sequencing  Another round of dynamic standing x 3 min using hemiwalker focusing on her posture and weight bearing on Right LE          PATIENT EDUCATION: Education details: Pt educated throughout session about proper posture and technique with exercises. Improved exercise technique, movement at target joints, use of target muscles after min to mod verbal, visual, tactile cues. Continued education on technique with STS.  Person educated: Patient Education method: Explanation, Demonstration, and Verbal cues Education comprehension: verbalized understanding, returned demonstration, and needs further education   HOME EXERCISE PROGRAM: Continue as previously given;    PT Short Term Goals -       PT SHORT TERM GOAL #1   Title Patient will be independent in home exercise program to improve strength/mobility for better functional independence with ADLs.    Baseline no formal HEP for LE/balance at this time; 02/08/2022=Patient continues to require VC for reminders and assist with hip march/knee ext (seated for strengthening); Reminders to continue to work on static stand at home.    Time 4    Period Weeks    Status Partially Met  04/07/22: complete somewhat regularly     Target Date 12/14/21              PT Long Term Goals -       PT LONG TERM GOAL #1   Title Patient will increase FOTO score to equal to or greater than   41  to demonstrate statistically significant improvement in mobility and quality of life.    Baseline 16 on 2/14; 02/08/2022=Patient unable to complete today- No family present but will attempt again next visit available with family present to assist in completion;  02/17/2022= 40; 06/28/2022- No caregiver present in PT session to assist in answering questions. 08/04/2022= 40; 11/01/2022= unable to complete- caregiver received a call and not in room to assist patient today. 11/22/2022= 40   Time 12    Period Weeks    Status On-going    Target Date 01/24/2023       PT LONG TERM GOAL #3   Title Patient  will complete five times sit to stand test in < 30 seconds with UE assistance indicating an increased LE strength and improved balance.    Baseline 58.47 s with UE assist and with A from PT for R LE positioning; 02/08/2022=54 sec with use of L UE support and Min physical assist to position right LE.  7/6: 30.5 sec, min A for LE  positioning, some attempts did not come to full erect posture, did not let go with L UE from armchair with any attempt 05/05/22: 26.65 sec   Time 12    Period Weeks    Status Met    Target Date 05/03/22      PT LONG TERM GOAL #4   Title Patient will increase Berg Balance score by > 6 points to demonstrate decreased fall risk during functional activities.    Baseline 12 on 2/14; 02/08/2022= 13/56  04/07/22: 17  8/3: 19    Time 12    Period Weeks    Status Met    Target Date 05/03/22      PT LONG TERM GOAL #5   Title Pt will transfer from seated position on table/chair to/from transport chair/WC in order to indicate improved transfer ability.    Baseline Requires Min A for LE positioning and to prevent LOB. 02/08/2022=Patient attempted to perform on her own- Unable to push herself up from chair- Very min assist to position right LE into more flexed position and VC for hand placement. Patient able to stand with min Assist with use of gait belt and VC to lean; 02/17/2022=Patient was able to transfer from transport w/c with some physical assist with Right to swing to pivot to side of chair. She was able to sit to stand today with only CGA and then stand pivot with right arm holding onto arm of chair. Only difficulty was stand to sit- Max VC not to  plop into chair. 04/07/22: Pt performed movement of B LE to side of chair independently with chair transfer. Pt required min A with R LE foot placement of floor. Pt requires increased time to complete but complete with CGA only for safety and no cues or A to prevent falling/ plopping into armchair.  8/3: Pt requires min A for maintenance of standig balance, able to navigate from transport chairr to arm chair with hemiwalker, does not controll descent with ahands still " flops" into chair; 08/04/2022- Patient continues to be inconsistent in her transfers- at times only CGA and other more min Assist and repetitive VC. Unclear due to some ongoing cognitive issues if patient will achieve this goal but still presents with potential. 09/08/2022- Patient able to stand with CGA yet continues to require initial VC for proper sequencing. 12/01/2022= Patient continues to present with inconsistent ability to stand pivot transfer - requiring min to mod assist at times   Time 12    Period Weeks    Status Partially met    Target Date 10/27/2022       PT LONG TERM GOAL #6   Title Patient will ambulate > 150 feet with CGA using Hemiwalker on level surfaces for improved household and short community distances.    Baseline 02/08/2022= 40 feet with use of HW, heavy CGA with max VC for gait sequencing and w/c follow. 5/18 = 85 feet with use of HW, close CGA  7/6: 45 feet, heavy use of hemiwalker 8/3: 85 feet with heavy hemi walker use, no Lob throughout, cues for appropriate weight distribution; 06/28/2022 - 205 feet with use of hemiwalker, CGA; 08/04/2022- Patient presents with inconsistent gait ability - able to walk 150 feet with max effort and increased time using hemiwalker and CGA. 10/18/2022= Patient ambulated around 45 feet at end of session and complained of being too fatigued to continue- very slow deliberate steps- difficulty advancing right LE; 11/01/2022- Patient ambulated approx 50 feet total today- using hemiwalker with  step to gait- Mod VC for gait sequencing and close w/c follow- Decreased step height/length on affected right side. 12/01/2022- Not tested today but patient last amb 45 feet last during last    Time 12    Period Weeks    Status On-going    Target Date 01/24/2023        PT LONG TERM GOAL #7   Title Pt will increase 10MWT by at least 0.13 m/s in order to demonstrate clinically significant improvement in community ambulation.    Baseline 02/08/2022= 0.05 m/s using HW 7/6: .043m/s with heavy ue use of hemiwalker and CGA for balance 8/3: .021m/s 06/28/2022= 0.1 m/s (will keep goal active to ensure she is consistent); 08/04/2022- 0.09 m/s); 10/18/2021= 0.07 m/s using hemiwalker; 11/01/2022= 0.07 m/s; 12/01/2022= Not attempted today will assess next visit   Time 12    Period Weeks    Status Ongoing     Target Date 01/24/2023      8.  Pt will improve 5X STS with full knee extension in standing each rep and with appropriate eccentric control in order to indicate improved transfer efficacy and improved LE strength  Baseline: 52.45 sec with UE support; 06/28/2022= 36.03 sec with UE support (VC for erect standing- patient continues to press up using back of legs against chair to stand); 08/04/2022- Patient required 45 sec using left UE and max VC (flopping at times with sitting); 10/18/2022 = 48 sec with Left UE and continued VC to lean forward and stand erect without falling back into chair- some improved eccentric control with last 2 reps. 11/01/2022= 39.7 sec with LE UE and CGA with VC to lean forward. 12/01/2022= attempted yet patient required min/mod assist  to recover.  Goal status: ONGOING TARGET date: 01/24/2023  9.  Patient will increase Berg Balance score by > 6 points to demonstrate decreased fall risk during functional activities Baseline: 19; 06/28/2022= 19/56; 08/04/2022- Deferred secondary to patient too fatigued to attempt after walking today. Will reassess next visit. 10/18/2022=21  Goal status:  ONGOING Target Date: 01/24/2023           Plan -     Clinical Impression Statement Patient had a much improved performance vs. Last several visits. She continues to present with profound right sided weakness yet able to transfer better and even able to walk some today with difficulty advancing right LE yet able if increased time.  Pt will continue to benefit from skilled PT to address pt's mobility deficits to optimize safety/functional mobility to reduce falls risk and care giver burden.     Personal Factors and Comorbidities Age;Comorbidity 1;Comorbidity 2;Comorbidity 3+;Time since onset of injury/illness/exacerbation;Other    Comorbidities AFib, Renal Artery thrombosis, situational depression, HTN, Pyelnephritis, Seizure activity, COnstipation, AKI    Examination-Activity Limitations Bathing;Bed Mobility;Carry;Locomotion Level;Dressing;Sit;Squat;Stairs;Stand;Toileting;Transfers    Examination-Participation Restrictions Church;Community Activity;Laundry    Stability/Clinical Decision Making Evolving/Moderate complexity    Rehab Potential Fair    PT Frequency 1-2x / week    PT Duration 12 weeks    PT Treatment/Interventions ADLs/Self Care Home Management;Gait training;Stair training;Therapeutic activities;Therapeutic exercise;Balance training;Neuromuscular re-education;Patient/family education;Wheelchair mobility training;Manual techniques;Energy conservation;Dry needling;Passive range of motion;Taping;Spinal Manipulations;Joint Manipulations    PT Next Visit Plan Gait training, Transfer training, LE strengthening as appropriate.    PT Home Exercise Plan HEP: general LE and postural exercises    Consulted and Agree with Plan of Care Patient            Ollen Bowl, PT Physical Therapist- [place]  [facility]  01/03/23, 9:34 PM

## 2023-01-05 ENCOUNTER — Ambulatory Visit: Payer: Medicare Other

## 2023-01-05 DIAGNOSIS — R269 Unspecified abnormalities of gait and mobility: Secondary | ICD-10-CM | POA: Diagnosis not present

## 2023-01-05 DIAGNOSIS — I63542 Cerebral infarction due to unspecified occlusion or stenosis of left cerebellar artery: Secondary | ICD-10-CM

## 2023-01-05 DIAGNOSIS — R262 Difficulty in walking, not elsewhere classified: Secondary | ICD-10-CM

## 2023-01-05 DIAGNOSIS — R2681 Unsteadiness on feet: Secondary | ICD-10-CM

## 2023-01-05 DIAGNOSIS — M6281 Muscle weakness (generalized): Secondary | ICD-10-CM

## 2023-01-05 DIAGNOSIS — R278 Other lack of coordination: Secondary | ICD-10-CM

## 2023-01-05 DIAGNOSIS — I63512 Cerebral infarction due to unspecified occlusion or stenosis of left middle cerebral artery: Secondary | ICD-10-CM

## 2023-01-05 DIAGNOSIS — R2689 Other abnormalities of gait and mobility: Secondary | ICD-10-CM

## 2023-01-05 NOTE — Therapy (Signed)
OUTPATIENT PHYSICAL THERAPY TREATMENT NOTE   Patient Name: Lindsey [name] MRN: [id number] DOB:[birth year], 67 y.o., female Today's Date: 01/05/2023  PCP: Dr. Tomasa Hose MD REFERRING PROVIDER: Leeroy Cha MD   PT End of Session - 01/05/23 1049     Visit Number 27    Number of Visits 12    Date for PT Re-Evaluation 01/24/23    Authorization Type Medicare    Authorization Time Period Recert [id number]    Progress Note Due on Visit 90    PT Start Time 1102    PT Stop Time 1143    PT Time Calculation (min) 41 min    Equipment Utilized During Treatment Gait belt    Activity Tolerance Patient tolerated treatment well;No increased pain    Behavior During Therapy WFL for tasks assessed/performed                                  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 No pertinent surgical history. 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 09/24/2021   Cerebral embolism with cerebral infarction 09/23/2021   Pyelonephritis 09/19/2021   Pain due to onychomycosis of toenails of both feet 11/19/2020   Normocytic anemia 05/31/2020   Acute ischemic left MCA stroke 04/30/2020   Right hemiplegia 04/30/2020   Cerebrovascular accident 04/21/2020   Atrial fibrillation with RVR 04/21/2020    Essential hypertension 04/21/2020   Alcohol abuse 04/21/2020    REFERRING DIAG: s/p CVA  THERAPY DIAG:  Abnormality of gait and mobility  Difficulty in walking, not elsewhere classified  Muscle weakness (generalized)  Unsteadiness on feet  Other lack of coordination  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 Pt since using WC for primary mobility. Pt has hesitation with LLE, significantly weak RLE. Caregiver assists with all transfers. Pt has 2 level home, chair lift for 2nd level. Pt would like to improve transfers, improve Rt neglect, and progress general strength. 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 Primary impairment includes Rt hemiplegia and global aphasia. H/o prior CVA c Rt hemiplegia July 2021. PMH: AF, Renal Artery thrombosis, HTN, pyelonephritis, seizure activity, constipation, AKI, Vitamin D deficiency, and urinary incontinence.    PRECAUTIONS: fall risk, wearing RLE AFO  SUBJECTIVE: Patient states no complaints today- No caregiver present in waiting room or during visit.   PAIN:  Are you having pain? No      TODAY'S TREATMENT:  01/05/2023  SPT from transfer chair to gym chair with arm rest- Mod physical assist to stand and pivot prior to beginning below therex and therapeutic activities.    Therex:   Seated PROM to right knee and ext x 6 min total Seated AAROM Right knee flexion 2 sets of 12 reps  Increased time spent focusing on right LE LAQ without assist and PT positioned right knee into 90/90 position- Patient lacked enough hip flex/quad strength to pick up leg efficiently and kick leg- Was able to perform 8 reps til failure/fatigue- max difficulty  requiring increased time to perform  Seated AROM Right knee ext - 2 sets of 12 reps - (Partial ROM) Seated AROM (attempted on right knee flex) - patient unable to lift LE up enough to actively flex knee- requiring min assist to lift leg then successfully able to flex LE back- 2 sets of 12 Reps (extended time to complete).    Therapeutic activities: CGA to mod assist  with activities and use of gait belt for safety.   Sit to stand from  chair with Left UE support ( Constant reminders for arm placement) - initially max  assist did improve to mod  x 5 with right LE abducting and unable to provide much power- mostly left LE and PT assist.     Static standing from armed chair holding onto hemiwalker with left UE support  x approx 2 min x 4 trials attempting to place 50% WB through each LE and VC to stand erect. Patient able to return demonstration well and maintain good weight bearing through Right LE.  *patient fatigued with Right LE- transferred from armed chair back to transport chair - max assist and unable to pivot on Right LE             PATIENT EDUCATION: Education details: Pt educated throughout session about proper posture and technique with exercises. Improved exercise technique, movement at target joints, use of target muscles after min to mod verbal, visual, tactile cues. Continued education on technique with STS.  Person educated: Patient Education method: Explanation, Demonstration, and Verbal cues Education comprehension: verbalized understanding, returned demonstration, and needs further education   HOME EXERCISE PROGRAM: Continue as previously given;    PT Short Term Goals -       PT SHORT TERM GOAL #1   Title Patient will be independent in home exercise program to improve strength/mobility for better functional independence with ADLs.    Baseline no formal HEP for LE/balance at this time; 02/08/2022=Patient continues to require VC for reminders and assist with hip  march/knee ext (seated for strengthening); Reminders to continue to work on static stand at home.    Time 4    Period Weeks    Status Partially Met  04/07/22: complete somewhat regularly     Target Date 12/14/21              PT Long Term Goals -       PT LONG TERM GOAL #1   Title Patient will increase FOTO score to equal to or greater than   41  to demonstrate statistically significant improvement in mobility and quality of life.    Baseline 16 on 2/14; 02/08/2022=Patient unable to complete today- No family present but will attempt again next visit available with family present to assist in completion; 02/17/2022= 40; 06/28/2022- No caregiver present in PT session to assist in answering questions. 08/04/2022= 40; 11/01/2022= unable to complete- caregiver received a call and not in room  to assist patient today. 11/22/2022= 40   Time 12    Period Weeks    Status On-going    Target Date 01/24/2023       PT LONG TERM GOAL #3   Title Patient  will complete five times sit to stand test in < 30 seconds with UE assistance indicating an increased LE strength and improved balance.    Baseline 58.47 s with UE assist and with A from PT for R LE positioning; 02/08/2022=54 sec with use of L UE support and Min physical assist to position right LE.  7/6: 30.5 sec, min A for LE positioning, some attempts did not come to full erect posture, did not let go with L UE from armchair with any attempt 05/05/22: 26.65 sec   Time 12    Period Weeks    Status Met    Target Date 05/03/22      PT LONG TERM GOAL #4   Title Patient will increase Berg Balance score by > 6 points to demonstrate decreased fall risk during functional activities.    Baseline 12 on 2/14; 02/08/2022= 13/56  04/07/22: 17  8/3: 19    Time 12    Period Weeks    Status Met    Target Date 05/03/22      PT LONG TERM GOAL #5   Title Pt will transfer from seated position on table/chair to/from transport chair/WC in order to indicate improved  transfer ability.    Baseline Requires Min A for LE positioning and to prevent LOB. 02/08/2022=Patient attempted to perform on her own- Unable to push herself up from chair- Very min assist to position right LE into more flexed position and VC for hand placement. Patient able to stand with min Assist with use of gait belt and VC to lean; 02/17/2022=Patient was able to transfer from transport w/c with some physical assist with Right to swing to pivot to side of chair. She was able to sit to stand today with only CGA and then stand pivot with right arm holding onto arm of chair. Only difficulty was stand to sit- Max VC not to plop into chair. 04/07/22: Pt performed movement of B LE to side of chair independently with chair transfer. Pt required min A with R LE foot placement of floor. Pt requires increased time to complete but complete with CGA only for safety and no cues or A to prevent falling/ plopping into armchair.  8/3: Pt requires min A for maintenance of standig balance, able to navigate from transport chairr to arm chair with hemiwalker, does not controll descent with ahands still " flops" into chair; 08/04/2022- Patient continues to be inconsistent in her transfers- at times only CGA and other more min Assist and repetitive VC. Unclear due to some ongoing cognitive issues if patient will achieve this goal but still presents with potential. 09/08/2022- Patient able to stand with CGA yet continues to require initial VC for proper sequencing. 12/01/2022= Patient continues to present with inconsistent ability to stand pivot transfer - requiring min to mod assist at times   Time 12    Period Weeks    Status Partially met    Target Date 10/27/2022       PT LONG TERM GOAL #6   Title Patient will ambulate > 150 feet with CGA using Hemiwalker on level surfaces for improved household and short community distances.    Baseline 02/08/2022= 40 feet with use of HW, heavy CGA with max VC for gait  sequencing and w/c  follow. 5/18 = 85 feet with use of HW, close CGA  7/6: 45 feet, heavy use of hemiwalker 8/3: 85 feet with heavy hemi walker use, no Lob throughout, cues for appropriate weight distribution; 06/28/2022 - 205 feet with use of hemiwalker, CGA; 08/04/2022- Patient presents with inconsistent gait ability - able to walk 150 feet with max effort and increased time using hemiwalker and CGA. 10/18/2022= Patient ambulated around 45 feet at end of session and complained of being too fatigued to continue- very slow deliberate steps- difficulty advancing right LE; 11/01/2022- Patient ambulated approx 50 feet total today- using hemiwalker with step to gait- Mod VC for gait sequencing and close w/c follow- Decreased step height/length on affected right side. 12/01/2022- Not tested today but patient last amb 45 feet last during last    Time 12    Period Weeks    Status On-going    Target Date 01/24/2023        PT LONG TERM GOAL #7   Title Pt will increase 10MWT by at least 0.13 m/s in order to demonstrate clinically significant improvement in community ambulation.    Baseline 02/08/2022= 0.05 m/s using HW 7/6: .052m/s with heavy ue use of hemiwalker and CGA for balance 8/3: .070m/s 06/28/2022= 0.1 m/s (will keep goal active to ensure she is consistent); 08/04/2022- 0.09 m/s); 10/18/2021= 0.07 m/s using hemiwalker; 11/01/2022= 0.07 m/s; 12/01/2022= Not attempted today will assess next visit   Time 12    Period Weeks    Status Ongoing     Target Date 01/24/2023      8.  Pt will improve 5X STS with full knee extension in standing each rep and with appropriate eccentric control in order to indicate improved transfer efficacy and improved LE strength  Baseline: 52.45 sec with UE support; 06/28/2022= 36.03 sec with UE support (VC for erect standing- patient continues to press up using back of legs against chair to stand); 08/04/2022- Patient required 45 sec using left UE and max VC (flopping at times with sitting); 10/18/2022 = 48 sec  with Left UE and continued VC to lean forward and stand erect without falling back into chair- some improved eccentric control with last 2 reps. 11/01/2022= 39.7 sec with LE UE and CGA with VC to lean forward. 12/01/2022= attempted yet patient required min/mod assist  to recover.  Goal status: ONGOING TARGET date: 01/24/2023  9.  Patient will increase Berg Balance score by > 6 points to demonstrate decreased fall risk during functional activities Baseline: 19; 06/28/2022= 19/56; 08/04/2022- Deferred secondary to patient too fatigued to attempt after walking today. Will reassess next visit. 10/18/2022=21  Goal status: ONGOING Target Date: 01/24/2023           Plan -     Clinical Impression Statement Patient exhibited increased difficulty with all right LE activities- from more difficulty and unable to perform any LAQ without raising foot off floor to unable to turn to pivot with transfers today. Discussed with caregiver Diane about recent increase right sided weakness and she reported that the patient has been having more difficulty with transfers in/out of car. Discussed possible need to transition to home PT due to her recent decline and lack of progress and if the trips are a taxing effort for patient and unsafe with caregiver. Diane stated she would talk to patient daughter and  will further discuss in clinic next week.  Pt may continue to benefit from skilled PT to address pt's mobility deficits  to optimize safety/functional mobility to reduce falls risk and care giver burden.     Personal Factors and Comorbidities Age;Comorbidity 1;Comorbidity 2;Comorbidity 3+;Time since onset of injury/illness/exacerbation;Other    Comorbidities AFib, Renal Artery thrombosis, situational depression, HTN, Pyelnephritis, Seizure activity, COnstipation, AKI    Examination-Activity Limitations Bathing;Bed Mobility;Carry;Locomotion Level;Dressing;Sit;Squat;Stairs;Stand;Toileting;Transfers    Examination-Participation  Restrictions Church;Community Activity;Laundry    Stability/Clinical Decision Making Evolving/Moderate complexity    Rehab Potential Fair    PT Frequency 1-2x / week    PT Duration 12 weeks    PT Treatment/Interventions ADLs/Self Care Home Management;Gait training;Stair training;Therapeutic activities;Therapeutic exercise;Balance training;Neuromuscular re-education;Patient/family education;Wheelchair mobility training;Manual techniques;Energy conservation;Dry needling;Passive range of motion;Taping;Spinal Manipulations;Joint Manipulations    PT Next Visit Plan Gait training, Transfer training, LE strengthening as appropriate.    PT Home Exercise Plan HEP: general LE and postural exercises    Consulted and Agree with Plan of Care Patient            Ollen Bowl, PT Physical Therapist- [facility]  01/05/23, 2:42 PM

## 2023-01-10 ENCOUNTER — Ambulatory Visit: Payer: Medicare Other

## 2023-01-12 ENCOUNTER — Ambulatory Visit: Payer: Medicare Other

## 2023-01-17 ENCOUNTER — Ambulatory Visit: Payer: Medicare Other

## 2023-01-17 DIAGNOSIS — R278 Other lack of coordination: Secondary | ICD-10-CM

## 2023-01-17 DIAGNOSIS — R2689 Other abnormalities of gait and mobility: Secondary | ICD-10-CM

## 2023-01-17 DIAGNOSIS — R2681 Unsteadiness on feet: Secondary | ICD-10-CM

## 2023-01-17 DIAGNOSIS — R482 Apraxia: Secondary | ICD-10-CM

## 2023-01-17 DIAGNOSIS — I63542 Cerebral infarction due to unspecified occlusion or stenosis of left cerebellar artery: Secondary | ICD-10-CM

## 2023-01-17 DIAGNOSIS — I63512 Cerebral infarction due to unspecified occlusion or stenosis of left middle cerebral artery: Secondary | ICD-10-CM

## 2023-01-17 DIAGNOSIS — R269 Unspecified abnormalities of gait and mobility: Secondary | ICD-10-CM

## 2023-01-17 DIAGNOSIS — R262 Difficulty in walking, not elsewhere classified: Secondary | ICD-10-CM

## 2023-01-17 DIAGNOSIS — M6281 Muscle weakness (generalized): Secondary | ICD-10-CM

## 2023-01-17 NOTE — Therapy (Addendum)
OUTPATIENT PHYSICAL THERAPY TREATMENT/DISCHARGE NOTE   Patient Name: Lindsey [name] MRN: [id number] DOB:04-Jul-1956, 67 y.o., female Today's Date: 01/17/2023  PCP: Dr. Karie Fetch MD REFERRING PROVIDER: Sula Soda MD   PT End of Session - 01/17/23 0900     Visit Number 89    Number of Visits 97    Date for PT Re-Evaluation 01/24/23    Authorization Type Medicare    Authorization Time Period Recert 11/01/2022- 01/24/2023    Progress Note Due on Visit 90    PT Start Time 0845    PT Stop Time 0915    PT Time Calculation (min) 30 min    Equipment Utilized During Treatment Gait belt    Activity Tolerance Patient tolerated treatment well;Treatment limited secondary to medical complications (Comment)   more cognitive limitations today, more frustration, unclear whats going on   Behavior During Therapy WFL for tasks assessed/performed                 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 09/24/2021   Cerebral embolism with cerebral infarction 09/23/2021   Pyelonephritis 09/19/2021   Pain due to onychomycosis of toenails of both feet 11/19/2020   Normocytic anemia 05/31/2020   Acute ischemic left MCA stroke 04/30/2020   Right hemiplegia 04/30/2020    Cerebrovascular accident 04/21/2020   Atrial fibrillation with RVR 04/21/2020   Essential hypertension 04/21/2020   Alcohol abuse 04/21/2020    REFERRING DIAG: s/p CVA  THERAPY DIAG:  Abnormality of gait and mobility  Difficulty in walking, not elsewhere classified  Muscle weakness (generalized)  Unsteadiness on feet  Other lack of coordination  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 Pt since using WC for primary mobility. Pt has hesitation with LLE, significantly weak RLE. Caregiver assists with all transfers. Pt has 2 level home, chair lift for 2nd level. Pt would like to improve transfers, improve Rt neglect, and progress general strength. 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 Primary impairment includes Rt hemiplegia and global aphasia. H/o prior CVA c Rt hemiplegia July 2021. PMH: AF, Renal Artery thrombosis, HTN, pyelonephritis, seizure activity, constipation, AKI, Vitamin D deficiency, and urinary incontinence.    PRECAUTIONS: fall risk, wearing RLE AFO  SUBJECTIVE: Patient states no complaints today- DTR says they were in a hurry and forgot her sling.    PAIN:  Are you having pain? No  TODAY'S TREATMENT:  01/17/23  -maxA pivot  transfer from transport chair to plinth   -STS from elevated plinth, HW LUE x5 *cognitive struggle to follow cues for modification, min-modA to rise *difficulty problem solving with balance acquisition, unable -STS from plinth with min-modA facilitation for reps x5 -LUE seated RDL, red ball to Rt foot, then back to upright 2x8 *needs extensive cues to perform as directed, cognitive barriers, pt appears more frustrated   -maxA  pivot transfer from plinth to transport chair   Post session, met wth patient and DTR in conference room to discuss DC and recs for activity going forward, HHPT, etc. Pt/DTR agreeable with plan, encouraged to contact clinic with any additional questions.    PATIENT EDUCATION: Education details: Pt educated throughout session about proper posture and technique with exercises. Improved exercise technique, movement at target joints, use of target muscles after min to mod verbal, visual, tactile cues. Continued education on technique with STS.  Person educated: Patient Education method: Explanation, Demonstration, and Verbal cues Education comprehension: verbalized understanding, returned demonstration, and needs further education   HOME EXERCISE PROGRAM: Continue as previously given;    PT Short Term Goals -       PT SHORT TERM GOAL #1   Title Patient will be independent in home exercise program to improve strength/mobility for better functional independence with ADLs.    Baseline no formal HEP for LE/balance at this time; 02/08/2022=Patient continues to require VC for reminders and assist with hip march/knee ext (seated for strengthening); Reminders to continue to work on static stand at home.    Time 4    Period Weeks    Status Partially Met  04/07/22: complete somewhat regularly     Target Date 12/14/21              PT Long Term Goals -       PT LONG TERM GOAL #1   Title Patient will increase FOTO score to equal to or greater than   41  to demonstrate statistically significant improvement in mobility and quality of life.    Baseline 16 on 2/14; 02/08/2022=Patient unable to complete today- No family present but will attempt again next visit available with family present to assist in completion; 02/17/2022= 40; 06/28/2022- No caregiver present in PT session to assist in answering questions. 08/04/2022= 40; 11/01/2022= unable to complete- caregiver received a call and not in room to  assist patient today. 11/22/2022= 40   Time 12    Period Weeks    Status On-going    Target Date 01/24/2023       PT LONG TERM GOAL #3   Title Patient  will complete five times sit to stand test in < 30 seconds with UE assistance indicating an increased LE strength and improved balance.    Baseline 58.47 s with UE assist and with A from PT for R LE positioning; 02/08/2022=54 sec with use of L UE support and Min physical assist to position right LE.  7/6: 30.5 sec, min A for LE positioning, some attempts did not come to full erect posture, did not let go with L UE from armchair with any attempt 05/05/22: 26.65 sec   Time 12    Period Weeks    Status Met    Target Date 05/03/22      PT LONG TERM GOAL #4   Title Patient will increase Berg Balance score by > 6 points to demonstrate decreased fall risk during functional activities.    Baseline 12 on 2/14; 02/08/2022= 13/56  04/07/22: 17  8/3: 19    Time 12    Period Weeks    Status Met    Target Date 05/03/22      PT LONG TERM GOAL #5   Title Pt will transfer from seated position on table/chair to/from transport chair/WC in order to indicate improved transfer ability.    Baseline Requires Min A for LE positioning and to prevent LOB. 02/08/2022=Patient attempted to perform on her own- Unable to push herself up from chair- Very min assist to position right LE into more flexed position and VC for hand placement. Patient able to stand with min Assist with use of gait belt and VC to lean; 02/17/2022=Patient was able to transfer from transport w/c with some physical assist with Right to swing to pivot to side of chair. She was able to sit to stand today with only CGA and then stand pivot with right arm holding onto arm of chair. Only difficulty was stand to sit- Max VC not to plop into chair. 04/07/22: Pt performed movement of B LE to side of chair independently with chair transfer. Pt required min A with R LE foot placement of floor. Pt requires increased time  to complete but complete with CGA only for safety and no cues or A to prevent falling/ plopping into armchair.  8/3: Pt requires min A for maintenance of standig balance, able to navigate from transport chairr to arm chair with hemiwalker, does not controll descent with ahands still " flops" into chair; 08/04/2022- Patient continues to be inconsistent in her transfers- at times only CGA and other more min Assist and repetitive VC. Unclear due to some ongoing cognitive issues if patient will achieve this goal but still presents with potential. 09/08/2022- Patient able to stand with CGA yet continues to require initial VC for proper sequencing. 12/01/2022= Patient continues to present with inconsistent ability to stand pivot transfer - requiring min to mod assist at times   Time 12    Period Weeks    Status Partially met    Target Date 10/27/2022       PT LONG TERM GOAL #6   Title Patient will ambulate > 150 feet with CGA using Hemiwalker on level surfaces for improved household and short community distances.    Baseline 02/08/2022= 40 feet with use of HW, heavy CGA with max VC for gait sequencing and w/c follow. 5/18 = 85 feet with use of HW, close CGA  7/6: 45 feet, heavy use of hemiwalker 8/3: 85 feet with heavy hemi walker use, no Lob throughout, cues for appropriate weight distribution; 06/28/2022 - 205 feet with use of hemiwalker, CGA; 08/04/2022- Patient presents with inconsistent gait ability - able to walk 150 feet with max effort and increased time using hemiwalker and CGA. 10/18/2022= Patient ambulated around 45 feet at end of session and complained of being too fatigued to continue- very slow deliberate steps- difficulty advancing right LE; 11/01/2022- Patient ambulated approx 50 feet total today- using hemiwalker with step to gait- Mod VC for gait sequencing and close w/c follow- Decreased step height/length on affected right side. 12/01/2022- Not tested today but patient last amb 45 feet last during  last    Time 12    Period Weeks    Status On-going    Target Date 01/24/2023        PT LONG TERM GOAL #7   Title Pt will increase by at least 0.13 m/s in order to demonstrate clinically significant improvement in  community ambulation.    Baseline 02/08/2022= 0.05 m/s using HW 7/6: .081m/s with heavy ue use of hemiwalker and CGA for balance 8/3: .099m/s 06/28/2022= 0.1 m/s (will keep goal active to ensure she is consistent); 08/04/2022- 0.09 m/s); 10/18/2021= 0.07 m/s using hemiwalker; 11/01/2022= 0.07 m/s; 12/01/2022= Not attempted today will assess next visit   Time 12    Period Weeks    Status Ongoing     Target Date 01/24/2023      8.  Pt will improve 5X STS with full knee extension in standing each rep and with appropriate eccentric control in order to indicate improved transfer efficacy and improved LE strength  Baseline: 52.45 sec with UE support; 06/28/2022= 36.03 sec with UE support (VC for erect standing- patient continues to press up using back of legs against chair to stand); 08/04/2022- Patient required 45 sec using left UE and max VC (flopping at times with sitting); 10/18/2022 = 48 sec with Left UE and continued VC to lean forward and stand erect without falling back into chair- some improved eccentric control with last 2 reps. 11/01/2022= 39.7 sec with LE UE and CGA with VC to lean forward. 12/01/2022= attempted yet patient required min/mod assist  to recover.  Goal status: ONGOING TARGET date: 01/24/2023  9.  Patient will increase Berg Balance score by > 6 points to demonstrate decreased fall risk during functional activities Baseline: 19; 06/28/2022= 19/56; 08/04/2022- Deferred secondary to patient too fatigued to attempt after walking today. Will reassess next visit. 10/18/2022=21  Goal status: ONGOING Target Date: 01/24/2023           Plan -     Clinical Impression Statement Pt having difficulty with cues today, decreased problem solving, pt appears more frustrated  disappointed in her performance, atypical for her presentation- aphasia precludes more detailed report. Session ended after 30 minutes. Decided to make today pt's last session and transition to HHPT services for continued activty. Discussed with DTR recent plateau in funcitonal progress and concerns of burden for getting to clinic. Will DC at this time and coordinate with PCP for HHPT referral.     Personal Factors and Comorbidities Age;Comorbidity 1;Comorbidity 2;Comorbidity 3+;Time since onset of injury/illness/exacerbation;Other    Comorbidities AFib, Renal Artery thrombosis, situational depression, HTN, Pyelnephritis, Seizure activity, COnstipation, AKI    Examination-Activity Limitations Bathing;Bed Mobility;Carry;Locomotion Level;Dressing;Sit;Squat;Stairs;Stand;Toileting;Transfers    Examination-Participation Restrictions Church;Community Activity;Laundry    Stability/Clinical Decision Making Evolving/Moderate complexity    Rehab Potential Fair    PT Frequency 1-2x / week    PT Duration 12 weeks    PT Treatment/Interventions ADLs/Self Care Home Management;Gait training;Stair training;Therapeutic activities;Therapeutic exercise;Balance training;Neuromuscular re-education;Patient/family education;Wheelchair mobility training;Manual techniques;Energy conservation;Dry needling;Passive range of motion;Taping;Spinal Manipulations;Joint Manipulations    PT Next Visit Plan Gait training, Transfer training, LE strengthening as appropriate.    PT Home Exercise Plan HEP: general LE and postural exercises    Consulted and Agree with Plan of Care Patient            9:08 AM, 01/17/23 Rosamaria Lints, PT, DPT Physical Therapist - [facility]  [phone] (ASCOM)    01/17/23, 9:08 AM

## 2023-01-19 ENCOUNTER — Ambulatory Visit: Payer: Medicare Other

## 2023-01-19 ENCOUNTER — Encounter
Payer: Medicare Other | Attending: Physical Medicine and Rehabilitation | Admitting: Physical Medicine and Rehabilitation

## 2023-01-19 ENCOUNTER — Encounter: Payer: Self-pay | Admitting: Physical Medicine and Rehabilitation

## 2023-01-19 VITALS — BP 139/78 | HR 79 | Ht 67.0 in | Wt 160.0 lb

## 2023-01-19 DIAGNOSIS — I69351 Hemiplegia and hemiparesis following cerebral infarction affecting right dominant side: Secondary | ICD-10-CM | POA: Insufficient documentation

## 2023-01-19 DIAGNOSIS — I69398 Other sequelae of cerebral infarction: Secondary | ICD-10-CM | POA: Insufficient documentation

## 2023-01-19 DIAGNOSIS — R252 Cramp and spasm: Secondary | ICD-10-CM | POA: Diagnosis not present

## 2023-01-19 MED ORDER — ONABOTULINUMTOXINA 100 UNITS IJ SOLR
300.0000 [IU] | Freq: Once | INTRAMUSCULAR | Status: AC
Start: 2023-01-19 — End: 2023-01-19
  Administered 2023-01-19: 300 [IU] via INTRAMUSCULAR

## 2023-01-19 MED ORDER — SODIUM CHLORIDE (PF) 0.9 % IJ SOLN
3.0000 mL | Freq: Once | INTRAMUSCULAR | Status: AC
Start: 2023-01-19 — End: 2023-01-19
  Administered 2023-01-19: 3 mL

## 2023-01-19 NOTE — Progress Notes (Signed)
Botox Injection for spasticity, palpation and activation approach used for localization since ultrasound machine was not working   Dilution: 1:1 Indication: Severe spasticity which interferes with ADL,mobility and/or  hygiene and is unresponsive to medication management and other conservative care   Informed consent was obtained after describing risks and benefits of the procedure with the patient. This includes bleeding, bruising, infection, excessive weakness, or medication side effects. A REMS form is on file and signed.  1) Pectoralis: 50U 2) Flexor digitorum profundus: 50U in 1 site 3) Flexor carpi radialis: 50U U in 1 site 4) Flexor digitorum superficialis: 50U in 1 site 5) Pronator quadratus: 50U 6) Pronator teres: 50U     All injections were done after palpation and patient activation of the muscle confirmed the accurate location, and after negative drawback for blood. The patient tolerated the procedure well. Post procedure instructions were given. A followup appointment was made.  

## 2023-01-24 ENCOUNTER — Ambulatory Visit: Payer: Medicare Other

## 2023-01-26 ENCOUNTER — Ambulatory Visit: Payer: Medicare Other

## 2023-01-31 ENCOUNTER — Ambulatory Visit: Payer: Medicare Other

## 2023-02-02 ENCOUNTER — Ambulatory Visit: Payer: Medicare Other

## 2023-02-07 ENCOUNTER — Ambulatory Visit: Payer: Medicare Other

## 2023-02-08 ENCOUNTER — Other Ambulatory Visit: Payer: Self-pay

## 2023-02-08 ENCOUNTER — Other Ambulatory Visit: Payer: Medicare Other

## 2023-02-08 DIAGNOSIS — N280 Ischemia and infarction of kidney: Secondary | ICD-10-CM

## 2023-02-08 DIAGNOSIS — D649 Anemia, unspecified: Secondary | ICD-10-CM

## 2023-02-09 ENCOUNTER — Inpatient Hospital Stay: Payer: Medicare Other | Attending: Family Medicine

## 2023-02-09 ENCOUNTER — Ambulatory Visit: Payer: Medicare Other

## 2023-02-13 ENCOUNTER — Ambulatory Visit: Payer: Medicare Other | Admitting: Oncology

## 2023-02-14 ENCOUNTER — Ambulatory Visit: Payer: Medicare Other

## 2023-02-14 ENCOUNTER — Inpatient Hospital Stay: Payer: Medicare Other | Admitting: Oncology

## 2023-02-14 NOTE — Assessment & Plan Note (Deleted)
#  renal artery thrombosis and recurrent stroke 01/28/2022 repeat lupus anticoagulant was negative.  Patient does not have antiphospholipid syndrome. Negative prothrombin gene mutation, negative factor V Leiden mutation.  ANCA is negative. Clinically he is doing very well on Coumadin.  Recommend patient to continue Coumadin, goal of INR 2-3 continue follow-up with primary care provider. Other options include DOACs. 

## 2023-02-14 NOTE — Assessment & Plan Note (Deleted)
Check iron panel - WNL.  Likely anemia of chronic disease. Observation.  

## 2023-02-16 ENCOUNTER — Ambulatory Visit: Payer: Medicare Other

## 2023-02-21 ENCOUNTER — Ambulatory Visit: Payer: Medicare Other

## 2023-02-23 ENCOUNTER — Ambulatory Visit: Payer: Medicare Other

## 2023-03-01 ENCOUNTER — Ambulatory Visit: Payer: Medicare Other

## 2023-03-07 ENCOUNTER — Ambulatory Visit: Payer: Medicare Other

## 2023-03-09 ENCOUNTER — Ambulatory Visit: Payer: Medicare Other

## 2023-03-14 ENCOUNTER — Ambulatory Visit: Payer: Medicare Other

## 2023-03-16 ENCOUNTER — Ambulatory Visit: Payer: Medicare Other

## 2023-03-21 ENCOUNTER — Ambulatory Visit: Payer: Medicare Other

## 2023-03-23 ENCOUNTER — Ambulatory Visit: Payer: Medicare Other

## 2023-03-28 ENCOUNTER — Ambulatory Visit: Payer: Medicare Other

## 2023-03-30 ENCOUNTER — Ambulatory Visit: Payer: Medicare Other

## 2023-04-04 ENCOUNTER — Ambulatory Visit: Payer: Medicare Other

## 2023-04-11 ENCOUNTER — Ambulatory Visit: Payer: Medicare Other

## 2023-04-13 ENCOUNTER — Ambulatory Visit: Payer: Medicare Other

## 2023-04-18 ENCOUNTER — Ambulatory Visit: Payer: Medicare Other | Admitting: Physical Therapy

## 2023-04-20 ENCOUNTER — Ambulatory Visit: Payer: Medicare Other

## 2023-04-25 ENCOUNTER — Encounter
Payer: Medicare Other | Attending: Physical Medicine and Rehabilitation | Admitting: Physical Medicine and Rehabilitation

## 2023-04-25 ENCOUNTER — Encounter: Payer: Self-pay | Admitting: Physical Medicine and Rehabilitation

## 2023-04-25 ENCOUNTER — Ambulatory Visit: Payer: Medicare Other

## 2023-04-25 VITALS — BP 170/95 | HR 72 | Temp 99.0°F | Ht 67.0 in

## 2023-04-25 DIAGNOSIS — R252 Cramp and spasm: Secondary | ICD-10-CM | POA: Diagnosis present

## 2023-04-25 DIAGNOSIS — M62838 Other muscle spasm: Secondary | ICD-10-CM | POA: Diagnosis not present

## 2023-04-25 DIAGNOSIS — I69398 Other sequelae of cerebral infarction: Secondary | ICD-10-CM | POA: Diagnosis not present

## 2023-04-25 MED ORDER — SODIUM CHLORIDE (PF) 0.9 % IJ SOLN
3.0000 mL | Freq: Once | INTRAMUSCULAR | Status: AC
Start: 2023-04-25 — End: 2023-04-25
  Administered 2023-04-25: 3 mL via INTRAVENOUS

## 2023-04-25 MED ORDER — ONABOTULINUMTOXINA 100 UNITS IJ SOLR
300.0000 [IU] | Freq: Once | INTRAMUSCULAR | Status: AC
Start: 2023-04-25 — End: 2023-04-25
  Administered 2023-04-25: 300 [IU] via INTRAMUSCULAR

## 2023-04-25 NOTE — Progress Notes (Signed)
Botox Injection for spasticity, palpation and activation approach used for localization since ultrasound machine was not working   Dilution: 1:1 Indication: Severe spasticity which interferes with ADL,mobility and/or  hygiene and is unresponsive to medication management and other conservative care   Informed consent was obtained after describing risks and benefits of the procedure with the patient. This includes bleeding, bruising, infection, excessive weakness, or medication side effects. A REMS form is on file and signed.  1) Opponens pollicis: 25U 1 site 2) Flexor digitorum profundus: 50U in 1 site 3) Flexor carpi radialis: 50U U in 1 site 4) Flexor digitorum superficialis: 50U in 1 site 5) Pronator quadratus: 50U 6) Pronator teres: 50U 7) Abductor pollicis: 25U 1 site     All injections were done after palpation and patient activation of the muscle confirmed the accurate location, and after negative drawback for blood. The patient tolerated the procedure well. Post procedure instructions were given. A followup appointment was made.

## 2023-04-27 ENCOUNTER — Ambulatory Visit: Payer: Medicare Other

## 2023-05-02 ENCOUNTER — Ambulatory Visit: Payer: Medicare Other

## 2023-05-04 ENCOUNTER — Ambulatory Visit: Payer: Medicare Other

## 2023-05-05 ENCOUNTER — Inpatient Hospital Stay: Payer: Medicare Other | Attending: Family Medicine

## 2023-05-05 ENCOUNTER — Encounter: Payer: Self-pay | Admitting: Oncology

## 2023-05-05 ENCOUNTER — Inpatient Hospital Stay (HOSPITAL_BASED_OUTPATIENT_CLINIC_OR_DEPARTMENT_OTHER): Payer: Medicare Other | Admitting: Oncology

## 2023-05-05 VITALS — BP 176/89 | HR 67 | Temp 96.7°F | Resp 18 | Wt 164.0 lb

## 2023-05-05 DIAGNOSIS — I4891 Unspecified atrial fibrillation: Secondary | ICD-10-CM | POA: Insufficient documentation

## 2023-05-05 DIAGNOSIS — Z8673 Personal history of transient ischemic attack (TIA), and cerebral infarction without residual deficits: Secondary | ICD-10-CM | POA: Insufficient documentation

## 2023-05-05 DIAGNOSIS — D649 Anemia, unspecified: Secondary | ICD-10-CM | POA: Diagnosis not present

## 2023-05-05 DIAGNOSIS — N280 Ischemia and infarction of kidney: Secondary | ICD-10-CM

## 2023-05-05 DIAGNOSIS — R509 Fever, unspecified: Secondary | ICD-10-CM | POA: Insufficient documentation

## 2023-05-05 DIAGNOSIS — Z7901 Long term (current) use of anticoagulants: Secondary | ICD-10-CM | POA: Insufficient documentation

## 2023-05-05 DIAGNOSIS — R000 Tachycardia, unspecified: Secondary | ICD-10-CM | POA: Diagnosis not present

## 2023-05-05 LAB — CMP (CANCER CENTER ONLY)
ALT: 21 U/L (ref 0–44)
AST: 27 U/L (ref 15–41)
Albumin: 3.7 g/dL (ref 3.5–5.0)
Alkaline Phosphatase: 85 U/L (ref 38–126)
Anion gap: 11 (ref 5–15)
BUN: 19 mg/dL (ref 8–23)
CO2: 24 mmol/L (ref 22–32)
Calcium: 9.3 mg/dL (ref 8.9–10.3)
Chloride: 103 mmol/L (ref 98–111)
Creatinine: 0.9 mg/dL (ref 0.44–1.00)
GFR, Estimated: >60 mL/min (ref >60)
Glucose, Bld: 82 mg/dL (ref 70–99)
Potassium: 3.8 mmol/L (ref 3.5–5.1)
Sodium: 138 mmol/L (ref 135–145)
Total Bilirubin: 0.2 mg/dL — ABNORMAL LOW (ref 0.3–1.2)
Total Protein: 8.3 g/dL — ABNORMAL HIGH (ref 6.5–8.1)

## 2023-05-05 LAB — RETIC PANEL
Immature Retic Fract: 13.6 % (ref 2.3–15.9)
RBC.: 3.86 MIL/uL — ABNORMAL LOW (ref 3.87–5.11)
Retic Count, Absolute: 44.8 10*3/uL (ref 19.0–186.0)
Retic Ct Pct: 1.2 % (ref 0.4–3.1)
Reticulocyte Hemoglobin: 31.8 pg (ref >27.9)

## 2023-05-05 LAB — CBC WITH DIFFERENTIAL (CANCER CENTER ONLY)
Abs Immature Granulocytes: 0.01 10*3/uL (ref 0.00–0.07)
Basophils Absolute: 0 10*3/uL (ref 0.0–0.1)
Basophils Relative: 1 %
Eosinophils Absolute: 0.1 10*3/uL (ref 0.0–0.5)
Eosinophils Relative: 4 %
HCT: 33.4 % — ABNORMAL LOW (ref 36.0–46.0)
Hemoglobin: 11.2 g/dL — ABNORMAL LOW (ref 12.0–15.0)
Immature Granulocytes: 0 %
Lymphocytes Relative: 33 %
Lymphs Abs: 1.2 10*3/uL (ref 0.7–4.0)
MCH: 28.4 pg (ref 26.0–34.0)
MCHC: 33.5 g/dL (ref 30.0–36.0)
MCV: 84.6 fL (ref 80.0–100.0)
Monocytes Absolute: 0.5 10*3/uL (ref 0.1–1.0)
Monocytes Relative: 13 %
Neutro Abs: 1.7 10*3/uL (ref 1.7–7.7)
Neutrophils Relative %: 49 %
Platelet Count: 277 10*3/uL (ref 150–400)
RBC: 3.95 MIL/uL (ref 3.87–5.11)
RDW: 14.1 % (ref 11.5–15.5)
WBC Count: 3.6 10*3/uL — ABNORMAL LOW (ref 4.0–10.5)
nRBC: 0 % (ref 0.0–0.2)

## 2023-05-05 LAB — IRON AND TIBC
Iron: 45 ug/dL (ref 28–170)
Saturation Ratios: 15 % (ref 10.4–31.8)
TIBC: 302 ug/dL (ref 250–450)
UIBC: 257 ug/dL

## 2023-05-05 LAB — FERRITIN: Ferritin: 219 ng/mL (ref 11–307)

## 2023-05-05 NOTE — Assessment & Plan Note (Addendum)
iron panel - WNL.  Previous workup showed negative M protein on SPEP, slightly increased light chain ratio, nonspecific. Likely anemia of chronic disease.  Hemoglobin has improved. Observation.

## 2023-05-05 NOTE — Assessment & Plan Note (Addendum)
#  renal artery thrombosis and recurrent stroke 01/28/2022 repeat lupus anticoagulant was negative.  Patient does not have antiphospholipid syndrome. Negative prothrombin gene mutation, negative factor V Leiden mutation.  ANCA is negative. Clinically he is doing very well on Coumadin.  Recommend patient to continue Coumadin, goal of INR 2-3 Per patient and family, INR was checked at nursing home, and Coumadin dosing was managed by primary care provider. Other options include DOACs.

## 2023-05-05 NOTE — Progress Notes (Signed)
Hematology/Oncology Progress note [phone] [phone]         Patient Care Team: Emogene Morgan, MD as PCP - General (Family Medicine) Jim Like, RN as Registered Nurse Scarlett Presto, RN (Inactive) as Registered Nurse Rickard Patience, MD as Consulting Physician (Hematology and Oncology)  CHIEF COMPLAINTS/REASON FOR VISIT:   renal artery thrombosis, chronic anemia.   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  Patient does not have antiphospholipid syndrome. Negative prothrombin gene mutation, negative factor V Leiden mutation.  ANCA is negative. Clinically he is doing very well on Coumadin.  Recommend patient to continue Coumadin, goal of INR 2-3 Per patient and family, INR was checked at nursing home, and Coumadin dosing was managed by primary care provider. Other options include DOACs.  Normocytic anemia iron panel - WNL.  Likely anemia of chronic disease.  Hemoglobin has improved. Observation.   Orders Placed This Encounter  Procedures   CMP (Cancer Center only)    Standing Status:   Future    Standing Expiration Date:   05/04/2024   CBC with Differential (Cancer Center Only)    Standing Status:   Future    Standing Expiration Date:   05/04/2024   Iron and TIBC    Standing Status:   Future    Standing Expiration Date:   05/04/2024   Ferritin    Standing Status:   Future    Standing Expiration Date:   05/04/2024   Retic Panel    Standing Status:   Future    Standing Expiration Date:   05/04/2024   Follow-up in 87-month All questions were answered. The patient knows to call the clinic with any problems, questions or concerns.  Rickard Patience, MD, PhD [facility] Hematology Oncology 05/05/2023    HISTORY OF PRESENTING ILLNESS:   Lindsey [name] is a  67 y.o.  female with PMH listed below was seen in consultation at the request of  Aycock, Ngwe A,  MD  for evaluation of renal artery thrombosis.  Patient initially presented to emergency room for evaluation of nausea, vomiting, diaphoresis, change of mental status.  Her initial evaluation included CT scan of abdomen pelvis which revealed multifocal pyelonephritis of the right kidney.  Patient was treated with intravenous antibiotics.  At baseline, patient has no history of prior left MCA stroke and right hemiplegia and aphasia.  Patient also has history of atrial fibrillation along has been on Eliquis 5 mg twice daily. 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  There was also concern about seizure activities due to decreased responsiveness and neurology started patient on Keppra.  Patient later was transitioned to Levaquin and discharged to inpatient rehab on 09/24/2021.  There he developed leukocytosis, tachycardia and a fever again.  09/26/2021 repeat CT scan of the abdomen pelvis showed interim development of left renal artery thrombus.  Patient was transferred back to acute care internal medicine.  Antibodies for antiphospholipid syndrome were collected while patient was on heparin. 09/28/2021  lupus anticoagulant positive Anticardiolipin IgG, IgM negative, IgA 20 Beta-2 glycoprotein IgG, IgM, IgA negative  01/28/2022 repeat lupus anticoagulant was negative.  Patient is on chronic anticoagulation with Coumadin with INR goal 2-3.   INTERVAL HISTORY Lindsey [name] is a 67 y.o. female who has above history reviewed by me today presents for  follow up visit for renal artery thrombosis/CVA Patient is accompanied by her daughter.  She is currently on anticoagulation with Coumadin. Coumadin dosing managed by PCP with goal of 2-3 Denies any acute bleeding.  Patient has no new complaints today.  Review of Systems  Unable to perform ROS: Other (Aphasia)  Constitutional:   Negative for appetite change.  Respiratory:  Negative for shortness of breath.   Cardiovascular:  Negative for chest pain.  Gastrointestinal:  Negative for abdominal pain.  Musculoskeletal:  Negative for arthralgias.  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  SOCIAL HISTORY: Social History   Socioeconomic History   Marital status: Single    Spouse name: Not on file   Number of children: Not on file   Years of education: Not on file   Highest education level: Not on file  Occupational History   Not on file  Tobacco Use   Smoking status: Never    Passive exposure: Never   Smokeless tobacco: Never  Vaping Use   Vaping status: Never Used  Substance and Sexual Activity   Alcohol use: Not Currently    Comment: states once a wine/beer occassionally    Drug use: No   Sexual activity: Not Currently  Other Topics Concern   Not on file  Social History Narrative   Not on file   Social Determinants of Health   Financial Resource Strain: Low Risk  (03/30/2018)   Received from [facility], [facility]   Overall Financial Resource Strain (CARDIA)    Difficulty of Paying Living Expenses: Not hard at all  Food Insecurity: No Food Insecurity (07/11/2022)   Hunger Vital Sign    Worried About Running Out of Food in the Last Year: Never true    Ran Out of Food in the Last Year: Never true  Transportation Needs: No Transportation Needs (07/11/2022)   PRAPARE - Administrator, Civil Service (Medical): No    Lack of  Transportation (Non-Medical): No  Physical Activity: Not on file  Stress: Not on file  Social Connections: Unknown (03/30/2018)   Received from [facility], [facility]   Social Connection and Isolation Panel [NHANES]    Frequency of Communication with Friends and Family: Not on file    Frequency of Social Gatherings with Friends and Family: Not on file    Attends Religious Services: Not on file    Active Member of Clubs or Organizations: Not on file    Attends Banker Meetings: Not on file    Marital Status: Never married  Intimate Partner Violence: Not At Risk (03/30/2018)   Received from [facility], [facility]   Humiliation, Afraid, Rape, and Kick questionnaire    Fear of Current or Ex-Partner: No    Emotionally Abused: No    Physically Abused: No    Sexually Abused: No    FAMILY HISTORY: Family History  Problem Relation  Age of Onset   Breast cancer Neg Hx     ALLERGIES:  has No Known Allergies.  MEDICATIONS:  Current Outpatient Medications  Medication Sig Dispense Refill   acetaminophen (TYLENOL) 325 MG tablet Take 1-2 tablets (325-650 mg total) by mouth every 4 (four) hours as needed for mild pain. (Patient taking differently: Take 650 mg by mouth every 4 (four) hours as needed for mild pain.)     aspirin 81 MG chewable tablet Chew 81 mg by mouth daily as needed for moderate pain.     atorvastatin (LIPITOR) 20 MG tablet Take 1 tablet (20 mg total) by mouth daily at 6 PM. 90 tablet 3   Baclofen 5 MG TABS Take 1 tablet by mouth at bedtime. 60 tablet 2   gabapentin (NEURONTIN) 100 MG capsule Take 100 mg by mouth daily.     levETIRAcetam (KEPPRA) 500 MG tablet TAKE 1 TABLET BY MOUTH EVERY 12 HOURS. (Patient taking differently: Take 500 mg by mouth 2 (two) times daily.) 180 tablet 2   megestrol (MEGACE) 400 MG/10ML suspension Take 10 mLs (400 mg total) by mouth daily. (Patient taking differently: Take 400 mg by mouth at bedtime.) 240 mL 0    metoprolol succinate (TOPROL-XL) 50 MG 24 hr tablet Take 50 mg by mouth 2 (two) times daily.     Multiple Vitamins-Minerals (MULTIVITAMIN ADULTS 50+) TABS Take 1 tablet by mouth daily.     nystatin (MYCOSTATIN/NYSTOP) powder Apply 1 Application topically 2 (two) times daily.     sertraline (ZOLOFT) 25 MG tablet TAKE 1 TABLET BY MOUTH AT BEDTIME (Patient taking differently: Take 50 mg by mouth daily.) 90 tablet 2   valsartan (DIOVAN) 40 MG tablet Take 1 tablet (40 mg total) by mouth daily. 90 tablet 3   Vitamin D, Ergocalciferol, (DRISDOL) 1.25 MG (50000 UNIT) CAPS capsule Take 1 capsule (50,000 Units total) by mouth every 7 (seven) days. Each Tuesday (Patient taking differently: Take 50,000 Units by mouth every 7 (seven) days. Each Monday) 12 capsule 0   warfarin (COUMADIN) 2.5 MG tablet TAKE 1 TABLET BY MOUTH ONCE DAILY AT 4:00 P.M. (Patient taking differently: Take 7.5 mg by mouth See admin instructions. On Sun, Tue, Thu, Fri, Sat) 90 tablet 2   No current facility-administered medications for this visit.     PHYSICAL EXAMINATION: ECOG PERFORMANCE STATUS: 3 - Symptomatic, >50% confined to bed Vitals:   05/05/23 1042  BP: (!) 176/89  Pulse: 67  Resp: 18  Temp: (!) 96.7 F (35.9 C)  SpO2: 98%   Filed Weights   05/05/23 1042  Weight: 164 lb (74.4 kg)    Physical Exam Constitutional:      General: She is not in acute distress.    Comments: Patient sits in the wheelchair  HENT:     Head: Normocephalic and atraumatic.  Eyes:     General: No scleral icterus. Cardiovascular:     Rate and Rhythm: Normal rate.     Heart sounds: Normal heart sounds.  Pulmonary:     Effort: Pulmonary effort is normal. No respiratory distress.  Abdominal:     General: Bowel sounds are normal.     Palpations: Abdomen is soft.  Musculoskeletal:        General: Normal range of motion.     Cervical back: Normal range of motion and neck supple.     Right lower leg: No edema.  Skin:    General: Skin  is warm and dry.     Findings: No  rash.  Neurological:     Mental Status: She is alert. Mental status is at baseline.     Comments: Aphasia  Right hemiplegia     LABORATORY DATA:  I have reviewed the data as listed Lab Results  Component Value Date   WBC 3.6 (L) 05/05/2023   HGB 11.2 (L) 05/05/2023   HCT 33.4 (L) 05/05/2023   MCV 84.6 05/05/2023   PLT 277 05/05/2023   Recent Labs    08/02/22 1414 09/24/22 1220 05/05/23 1015  NA 141 141 138  K 3.7 4.3 3.8  CL 114* 111 103  CO2 19* 19* 24  GLUCOSE 89 82 82  BUN 15 13 19   CREATININE 1.05* 1.00 0.90  CALCIUM 9.2 9.1 9.3  GFRNONAA 59* >60 >60  PROT 7.8 7.1 8.3*  ALBUMIN 3.3* 3.0* 3.7  AST 24 21 27   ALT 21 13 21   ALKPHOS 58 53 85  BILITOT 0.2* 0.5 0.2*   Iron/TIBC/Ferritin/ %Sat    Component Value Date/Time   IRON 45 05/05/2023 1014   TIBC 302 05/05/2023 1014   FERRITIN 219 05/05/2023 1014   IRONPCTSAT 15 05/05/2023 1014      RADIOGRAPHIC STUDIES: I have personally reviewed the radiological images as listed and agreed with the findings in the report. No results found.

## 2023-05-09 ENCOUNTER — Ambulatory Visit: Payer: Medicare Other

## 2023-05-11 ENCOUNTER — Ambulatory Visit: Payer: Medicare Other

## 2023-05-16 ENCOUNTER — Ambulatory Visit: Payer: Medicare Other

## 2023-05-18 ENCOUNTER — Ambulatory Visit: Payer: Medicare Other

## 2023-05-23 ENCOUNTER — Ambulatory Visit: Payer: Medicare Other

## 2023-05-25 ENCOUNTER — Ambulatory Visit: Payer: Medicare Other

## 2023-05-30 ENCOUNTER — Ambulatory Visit: Payer: Medicare Other

## 2023-06-01 ENCOUNTER — Ambulatory Visit: Payer: Medicare Other

## 2023-06-06 ENCOUNTER — Ambulatory Visit: Payer: Medicare Other

## 2023-06-08 ENCOUNTER — Ambulatory Visit: Payer: Medicare Other

## 2023-06-13 ENCOUNTER — Ambulatory Visit: Payer: Medicare Other

## 2023-06-15 ENCOUNTER — Ambulatory Visit: Payer: Medicare Other

## 2023-06-20 ENCOUNTER — Ambulatory Visit: Payer: Medicare Other

## 2023-06-22 ENCOUNTER — Ambulatory Visit: Payer: Medicare Other

## 2023-06-27 ENCOUNTER — Ambulatory Visit: Payer: Medicare Other

## 2023-06-29 ENCOUNTER — Ambulatory Visit: Payer: Medicare Other

## 2023-08-01 ENCOUNTER — Encounter: Payer: Self-pay | Admitting: Physical Medicine and Rehabilitation

## 2023-08-01 ENCOUNTER — Encounter
Payer: Medicare Other | Attending: Physical Medicine and Rehabilitation | Admitting: Physical Medicine and Rehabilitation

## 2023-08-01 VITALS — BP 166/83 | HR 113 | Ht 67.0 in

## 2023-08-01 DIAGNOSIS — I69398 Other sequelae of cerebral infarction: Secondary | ICD-10-CM | POA: Diagnosis present

## 2023-08-01 DIAGNOSIS — R252 Cramp and spasm: Secondary | ICD-10-CM

## 2023-08-01 DIAGNOSIS — I69351 Hemiplegia and hemiparesis following cerebral infarction affecting right dominant side: Secondary | ICD-10-CM

## 2023-08-01 MED ORDER — ONABOTULINUMTOXINA 100 UNITS IJ SOLR
300.0000 [IU] | Freq: Once | INTRAMUSCULAR | Status: AC
  Administered 2023-08-01: 300 [IU] via INTRAMUSCULAR

## 2023-08-01 NOTE — Patient Instructions (Signed)
HTN: -BP is ___today.  -Advised checking BP daily at home and logging results to bring into follow-up appointment with PCP and myself. -Reviewed BP meds today.  -Advised regarding healthy foods that can help lower blood pressure and provided with a list: 1) citrus foods- high in vitamins and minerals 2) salmon and other fatty fish - reduces inflammation and oxylipins 3) swiss chard (leafy green)- high level of nitrates 4) pumpkin seeds- one of the best natural sources of magnesium 5) Beans and lentils- high in fiber, magnesium, and potassium 6) Berries- high in flavonoids 7) Amaranth (whole grain, can be cooked similarly to rice and oats)- high in magnesium and fiber 8) Pistachios- even more effective at reducing BP than other nuts 9) Carrots- high in phenolic compounds that relax blood vessels and reduce inflammation 10) Celery- contain phthalides that relax tissues of arterial walls 11) Tomatoes- can also improve cholesterol and reduce risk of heart disease 12) Broccoli- good source of magnesium, calcium, and potassium 13) Greek yogurt: high in potassium and calcium 14) Herbs and spices: Celery seed, cilantro, saffron, lemongrass, black cumin, ginseng, cinnamon, cardamom, sweet basil, and ginger 15) Chia and flax seeds- also help to lower cholesterol and blood sugar 16) Beets- high levels of nitrates that relax blood vessels  17) spinach and bananas- high in potassium  -Provided lise of supplements that can help with hypertension:  1) magnesium: one high quality brand is Bioptemizers since it contains all 7 types of magnesium, otherwise over the counter magnesium gluconate 400mg is a good option 2) B vitamins 3) vitamin D 4) potassium 5) CoQ10 6) L-arginine 7) Vitamin C 8) Beetroot -Educated that goal BP is 120/80. -Made goal to incorporate some of the above foods into diet.   

## 2023-08-01 NOTE — Progress Notes (Signed)
Botox Injection for spasticity, palpation and activation approach used for localization since ultrasound machine was not working   Dilution: 1:1 Indication: Severe spasticity which interferes with ADL,mobility and/or  hygiene and is unresponsive to medication management and other conservative care   Informed consent was obtained after describing risks and benefits of the procedure with the patient. This includes bleeding, bruising, infection, excessive weakness, or medication side effects. A REMS form is on file and signed.  1) Flexor digitorum profundus: 50U in 1 site 2) Flexor carpi radialis: 50U U in 1 site 3) Flexor digitorum superficialis: 50U in 1 site 4) Pronator quadratus: 50U 5) Pronator teres: 50U 6) Biceps: 50U     All injections were done after palpation and patient activation of the muscle confirmed the accurate location, and after negative drawback for blood. The patient tolerated the procedure well. Post procedure instructions were given. A followup appointment was made.

## 2023-08-30 ENCOUNTER — Other Ambulatory Visit: Payer: Self-pay

## 2023-08-30 ENCOUNTER — Emergency Department (HOSPITAL_COMMUNITY): Payer: Medicare Other

## 2023-08-30 ENCOUNTER — Emergency Department (HOSPITAL_COMMUNITY)
Admission: EM | Admit: 2023-08-30 | Discharge: 2023-08-30 | Disposition: A | Discharge status: Home / Self Care | Payer: Medicare Other | Attending: Emergency Medicine | Admitting: Emergency Medicine

## 2023-08-30 DIAGNOSIS — R531 Weakness: Secondary | ICD-10-CM | POA: Insufficient documentation

## 2023-08-30 DIAGNOSIS — S299XXA Unspecified injury of thorax, initial encounter: Secondary | ICD-10-CM | POA: Diagnosis not present

## 2023-08-30 DIAGNOSIS — W19XXXA Unspecified fall, initial encounter: Secondary | ICD-10-CM

## 2023-08-30 DIAGNOSIS — S3991XA Unspecified injury of abdomen, initial encounter: Secondary | ICD-10-CM | POA: Insufficient documentation

## 2023-08-30 DIAGNOSIS — Z7901 Long term (current) use of anticoagulants: Secondary | ICD-10-CM | POA: Diagnosis not present

## 2023-08-30 DIAGNOSIS — S0990XA Unspecified injury of head, initial encounter: Secondary | ICD-10-CM | POA: Diagnosis present

## 2023-08-30 DIAGNOSIS — S0083XA Contusion of other part of head, initial encounter: Secondary | ICD-10-CM | POA: Diagnosis not present

## 2023-08-30 DIAGNOSIS — W050XXA Fall from non-moving wheelchair, initial encounter: Secondary | ICD-10-CM | POA: Insufficient documentation

## 2023-08-30 DIAGNOSIS — R4701 Aphasia: Secondary | ICD-10-CM | POA: Diagnosis not present

## 2023-08-30 LAB — COMPREHENSIVE METABOLIC PANEL
ALT: 20 U/L (ref 0–44)
AST: 25 U/L (ref 15–41)
Albumin: 3.1 g/dL — ABNORMAL LOW (ref 3.5–5.0)
Alkaline Phosphatase: 81 U/L (ref 38–126)
Anion gap: 9 (ref 5–15)
BUN: 13 mg/dL (ref 8–23)
CO2: 25 mmol/L (ref 22–32)
Calcium: 9.2 mg/dL (ref 8.9–10.3)
Chloride: 102 mmol/L (ref 98–111)
Creatinine, Ser: 0.86 mg/dL (ref 0.44–1.00)
GFR, Estimated: >60 mL/min (ref >60)
Glucose, Bld: 99 mg/dL (ref 70–99)
Potassium: 4.3 mmol/L (ref 3.5–5.1)
Sodium: 136 mmol/L (ref 135–145)
Total Bilirubin: 0.4 mg/dL (ref <1.2)
Total Protein: 8.7 g/dL — ABNORMAL HIGH (ref 6.5–8.1)

## 2023-08-30 LAB — I-STAT CHEM 8, ED
BUN: 18 mg/dL (ref 8–23)
Calcium, Ion: 1.19 mmol/L (ref 1.15–1.40)
Chloride: 103 mmol/L (ref 98–111)
Creatinine, Ser: 1 mg/dL (ref 0.44–1.00)
Glucose, Bld: 98 mg/dL (ref 70–99)
HCT: 30 % — ABNORMAL LOW (ref 36.0–46.0)
Hemoglobin: 10.2 g/dL — ABNORMAL LOW (ref 12.0–15.0)
Potassium: 4.8 mmol/L (ref 3.5–5.1)
Sodium: 140 mmol/L (ref 135–145)
TCO2: 27 mmol/L (ref 22–32)

## 2023-08-30 LAB — PROTIME-INR
INR: 3.9 — ABNORMAL HIGH (ref 0.8–1.2)
Prothrombin Time: 38.1 s — ABNORMAL HIGH (ref 11.4–15.2)

## 2023-08-30 LAB — CBC
HCT: 30.6 % — ABNORMAL LOW (ref 36.0–46.0)
Hemoglobin: 10 g/dL — ABNORMAL LOW (ref 12.0–15.0)
MCH: 28.5 pg (ref 26.0–34.0)
MCHC: 32.7 g/dL (ref 30.0–36.0)
MCV: 87.2 fL (ref 80.0–100.0)
Platelets: 280 10*3/uL (ref 150–400)
RBC: 3.51 MIL/uL — ABNORMAL LOW (ref 3.87–5.11)
RDW: 14.9 % (ref 11.5–15.5)
WBC: 5.3 10*3/uL (ref 4.0–10.5)
nRBC: 0 % (ref 0.0–0.2)

## 2023-08-30 LAB — SAMPLE TO BLOOD BANK

## 2023-08-30 MED ORDER — IOHEXOL 350 MG/ML SOLN
75.0000 mL | Freq: Once | INTRAVENOUS | Status: AC | PRN
  Administered 2023-08-30: 75 mL via INTRAVENOUS

## 2023-08-30 NOTE — ED Notes (Addendum)
Patient presents via EMS from [facility] for unwitnessed fall. Per EMS, the staff assume that the patient fell out of her wheelchair. Per EMS, patient denied complaints. Per EMS, patient has hematoma to right forehead. Per EMS, the staff at the facility stated that patient is at her baseline (alert and oriented to self only). Per EMS, patient has right-sided deficits and aphasia from a previous stroke.

## 2023-08-30 NOTE — ED Provider Notes (Signed)
[place] EMERGENCY DEPARTMENT AT [facility] Provider Note   CSN: [id number] Arrival date & time: 08/30/23  1546     History  Chief Complaint  Patient presents with   Fall   Trauma    Level 2    Kalene Abdo is a 67 y.o. female.  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  Per EMS she was found on the ground and had fallen out of her wheelchair.  Unclear how long she had been there.  EMS states facility states that she was at her baseline with right-sided weakness and aphasia.  She has small hematoma to the right forehead.  Otherwise was seen medically stable did not note any pain.  When I talk with the patient, she is able to tell me her name.  She tries to answer other questions but comes confused is limited by her aphasia.  She says no when I asked her if she has any pain.     Home Medications Prior to Admission medications   Medication Sig Start Date End Date Taking? Authorizing Provider  acetaminophen (TYLENOL) 325 MG tablet Take 1-2 tablets (325-650 mg total) by mouth every 4 (four) hours as needed for mild pain. Patient taking differently: Take 650 mg by mouth every 4 (four) hours as needed for mild pain (pain score 1-3). 10/21/21  Yes Setzer, Lynnell Jude, PA-C  Baclofen 5 MG TABS Take 1 tablet by mouth at bedtime. 10/13/22  Yes Raulkar, Drema Pry, MD  cloNIDine (CATAPRES) 0.1 MG tablet Take by mouth daily. 06/26/23  Yes [provider]  gabapentin (NEURONTIN) 100 MG capsule Take 100 mg by mouth daily. 02/25/22  Yes [provider]  levETIRAcetam (KEPPRA) 500 MG tablet TAKE 1 TABLET BY MOUTH EVERY 12 HOURS. Patient taking differently: Take 500 mg by mouth 2 (two) times daily. 02/09/22  Yes Raulkar, Drema Pry, MD  losartan (COZAAR) 50 MG tablet Take 50 mg by mouth daily. 07/31/23  Yes [provider]  metoprolol succinate (TOPROL-XL) 50  MG 24 hr tablet Take 50 mg by mouth 2 (two) times daily. 08/08/22  Yes [provider]  Multiple Vitamins-Minerals (MULTIVITAMIN ADULTS 50+) TABS Take 1 tablet by mouth daily.   Yes [provider]  sertraline (ZOLOFT) 50 MG tablet Take 50 mg by mouth daily. 02/03/23  Yes [provider]  Vitamin D, Ergocalciferol, (DRISDOL) 1.25 MG (50000 UNIT) CAPS capsule Take 1 capsule (50,000 Units total) by mouth every 7 (seven) days. Each Tuesday Patient taking differently: Take 50,000 Units by mouth every 7 (seven) days. Each Monday 11/19/21  Yes Raulkar, Drema Pry, MD  warfarin (COUMADIN) 1 MG tablet Take 0.5 mg by mouth every evening. In addition to 4mg  tablet every day   Yes [provider]  warfarin (COUMADIN) 4 MG tablet Take 4 mg by mouth every evening. With 0.5 mg to equal 4.5 mg daily 07/26/23  Yes [provider]  aspirin 81 MG chewable tablet Chew 81 mg by mouth daily as needed for moderate pain. Patient not taking: Reported on 08/30/2023 10/25/21   [provider]      Allergies    Patient has no known allergies.    Review of Systems   Review of Systems Review of systems completed and notable as per HPI.  ROS otherwise negative.   Physical Exam Updated Vital Signs BP (!) 140/75 (BP Location: Left Arm)   Pulse 69  Temp 98.9 F (37.2 C) (Oral)   Resp 18   Ht 5\' 7"  (1.702 m)   Wt 74.8 kg   SpO2 98%   BMI 25.84 kg/m  Physical Exam Vitals and nursing note reviewed.  Constitutional:      General: She is not in acute distress.    Appearance: She is well-developed.  HENT:     Head: Normocephalic.     Comments: Small hematoma to the right forehead    Nose: Nose normal.     Mouth/Throat:     Mouth: Mucous membranes are moist.     Pharynx: Oropharynx is clear.  Eyes:     Extraocular Movements: Extraocular movements intact.     Conjunctiva/sclera: Conjunctivae normal.     Pupils: Pupils are equal, round, and reactive to light.   Cardiovascular:     Rate and Rhythm: Normal rate and regular rhythm.     Pulses: Normal pulses.     Heart sounds: Normal heart sounds. No murmur heard. Pulmonary:     Effort: Pulmonary effort is normal. No respiratory distress.     Breath sounds: Normal breath sounds.  Abdominal:     Palpations: Abdomen is soft.     Tenderness: There is no abdominal tenderness.  Musculoskeletal:        General: No swelling or tenderness.     Cervical back: Normal range of motion and neck supple. No rigidity or tenderness.     Right lower leg: No edema.     Left lower leg: No edema.     Comments: Patient palpated head to toe.  She is mildly tender in the midline thoracic spine.  No step-offs or skin changes.  She has no other focal tenderness.  Able to move all of her extremities without pain distal pulses are intact.  Skin:    General: Skin is warm and dry.     Capillary Refill: Capillary refill takes less than 2 seconds.  Neurological:     Mental Status: She is alert.     Comments: Patient is awake and alert.  She has baseline aphasia but is able to state her name and answer yes or no questions.  She has significant weakness in the right upper greater than right lower extremity but is able to wiggle her toes on the right.  She has full strength in her left upper and left lower extremity.  Difficult to assess sensation.  She has no cranial nerve deficits, symmetric smile, tongue project symmetrically.  Extraocular movements are intact.  No visual field cuts.  Psychiatric:        Mood and Affect: Mood normal.     ED Results / Procedures / Treatments   Labs (all labs ordered are listed, but only abnormal results are displayed) Labs Reviewed  COMPREHENSIVE METABOLIC PANEL - Abnormal; Notable for the following components:      Result Value   Total Protein 8.7 (*)    Albumin 3.1 (*)    All other components within normal limits  CBC - Abnormal; Notable for the following components:   RBC 3.51 (*)     Hemoglobin 10.0 (*)    HCT 30.6 (*)    All other components within normal limits  PROTIME-INR - Abnormal; Notable for the following components:   Prothrombin Time 38.1 (*)    INR 3.9 (*)    All other components within normal limits  I-STAT CHEM 8, ED - Abnormal; Notable for the following components:   Hemoglobin 10.2 (*)  HCT 30.0 (*)    All other components within normal limits  URINALYSIS, ROUTINE W REFLEX MICROSCOPIC  SAMPLE TO BLOOD BANK    EKG EKG Interpretation Date/Time:  Wednesday August 30 2023 16:19:54 EST Ventricular Rate:  68 PR Interval:  202 QRS Duration:  98 QT Interval:  407 QTC Calculation: 433 R Axis:   35  Text Interpretation: Sinus rhythm Atrial premature complex Abnormal R-wave progression, early transition No significant change since last tracing Confirmed by Fulton Reek [phone]) on 08/30/2023 4:22:59 PM  Radiology CT L-SPINE NO CHARGE  Result Date: 08/30/2023 CLINICAL DATA:  Fall, trauma EXAM: CT THORACIC AND LUMBAR SPINE WITHOUT CONTRAST TECHNIQUE: Multidetector CT imaging of the thoracic and lumbar spine was performed without contrast. Multiplanar CT image reconstructions were also generated. RADIATION DOSE REDUCTION: This exam was performed according to the departmental dose-optimization program which includes automated exposure control, adjustment of the mA and/or kV according to patient size and/or use of iterative reconstruction technique. COMPARISON:  None Available. FINDINGS: CT THORACIC SPINE FINDINGS Segmentation: 12 thoracic type vertebral bodies with hypoplastic twelfth ribs. Alignment: Normal. Vertebrae: Diffusely decreased bone density. Multilevel mild degenerative changes spine. No acute fracture or focal pathologic process. Paraspinal and other soft tissues: Negative. Disc levels: Maintained. CT LUMBAR SPINE FINDINGS Segmentation: 4 lumbar type vertebrae. Alignment: Grade 1 anterolisthesis of L3 on L4. Vertebrae: Diffusely decreased bone  density. Multilevel mild degenerative changes of the spine. No acute fracture or focal pathologic process. Paraspinal and other soft tissues: Negative. Disc levels: Maintained. IMPRESSION: CT THORACIC SPINE IMPRESSION No acute displaced fracture or traumatic listhesis of the thoracic spine. CT LUMBAR SPINE IMPRESSION No acute displaced fracture or traumatic listhesis of the lumbar spine. Diffusely decreased bone density. Electronically Signed   By: Tish Frederickson M.D.   On: 08/30/2023 20:18   CT T-SPINE NO CHARGE  Result Date: 08/30/2023 CLINICAL DATA:  Fall, trauma EXAM: CT THORACIC AND LUMBAR SPINE WITHOUT CONTRAST TECHNIQUE: Multidetector CT imaging of the thoracic and lumbar spine was performed without contrast. Multiplanar CT image reconstructions were also generated. RADIATION DOSE REDUCTION: This exam was performed according to the departmental dose-optimization program which includes automated exposure control, adjustment of the mA and/or kV according to patient size and/or use of iterative reconstruction technique. COMPARISON:  None Available. FINDINGS: CT THORACIC SPINE FINDINGS Segmentation: 12 thoracic type vertebral bodies with hypoplastic twelfth ribs. Alignment: Normal. Vertebrae: Diffusely decreased bone density. Multilevel mild degenerative changes spine. No acute fracture or focal pathologic process. Paraspinal and other soft tissues: Negative. Disc levels: Maintained. CT LUMBAR SPINE FINDINGS Segmentation: 4 lumbar type vertebrae. Alignment: Grade 1 anterolisthesis of L3 on L4. Vertebrae: Diffusely decreased bone density. Multilevel mild degenerative changes of the spine. No acute fracture or focal pathologic process. Paraspinal and other soft tissues: Negative. Disc levels: Maintained. IMPRESSION: CT THORACIC SPINE IMPRESSION No acute displaced fracture or traumatic listhesis of the thoracic spine. CT LUMBAR SPINE IMPRESSION No acute displaced fracture or traumatic listhesis of the lumbar  spine. Diffusely decreased bone density. Electronically Signed   By: Tish Frederickson M.D.   On: 08/30/2023 20:18   CT HEAD WO CONTRAST  Addendum Date: 08/30/2023   ADDENDUM REPORT: 08/30/2023 19:02 ADDENDUM: Bilateral C7 cervical ribs. Electronically Signed   By: Tish Frederickson M.D.   On: 08/30/2023 19:02   Result Date: 08/30/2023 CLINICAL DATA:  Head trauma, moderate-severe; Polytrauma, blunt EXAM: CT HEAD WITHOUT CONTRAST CT CERVICAL SPINE WITHOUT CONTRAST TECHNIQUE: Multidetector CT imaging of the head and cervical spine was performed following  the standard protocol without intravenous contrast. Multiplanar CT image reconstructions of the cervical spine were also generated. RADIATION DOSE REDUCTION: This exam was performed according to the departmental dose-optimization program which includes automated exposure control, adjustment of the mA and/or kV according to patient size and/or use of iterative reconstruction technique. COMPARISON:  CT head and C-spine 01/02/2023 FINDINGS: CT HEAD FINDINGS Brain: Cerebral ventricle sizes are concordant with the degree of cerebral volume loss. Patchy and confluent areas of decreased attenuation are noted throughout the deep and periventricular white matter of the cerebral hemispheres bilaterally, compatible with chronic microvascular ischemic disease. Stable left cerebral encephalomalacia involving the MCA territory. Stable left cerebellar encephalomalacia. No evidence of large-territorial acute infarction. No parenchymal hemorrhage. No mass lesion. No extra-axial collection. No mass effect or midline shift. No hydrocephalus. Basilar cisterns are patent. Vascular: No hyperdense vessel. Atherosclerotic calcifications are present within the cavernous internal carotid arteries. Skull: No acute fracture or focal lesion. Sinuses/Orbits: Paranasal sinuses and mastoid air cells are clear. The orbits are unremarkable. Other: None. CT CERVICAL SPINE FINDINGS Alignment: Mild  retrolisthesis of C3 on C4. Mild retrolisthesis of C4 on C5. Grade 1 anterolisthesis of C6 on C7. Skull base and vertebrae: Multilevel moderate degenerative changes of the spine. No associated severe osseous neural foraminal stenosis. No acute fracture. No aggressive appearing focal osseous lesion or focal pathologic process. Soft tissues and spinal canal: No prevertebral fluid or swelling. No visible canal hematoma. Upper chest: Please see separately dictated CT chest 08/30/2023. No pneumothorax. Other: Retropharyngeal carotid arteries within the neck. IMPRESSION: 1. No acute intracranial abnormality. 2. No acute displaced fracture or traumatic listhesis of the cervical spine. Electronically Signed: By: Tish Frederickson M.D. On: 08/30/2023 17:50   CT CERVICAL SPINE WO CONTRAST  Addendum Date: 08/30/2023   ADDENDUM REPORT: 08/30/2023 19:02 ADDENDUM: Bilateral C7 cervical ribs. Electronically Signed   By: Tish Frederickson M.D.   On: 08/30/2023 19:02   Result Date: 08/30/2023 CLINICAL DATA:  Head trauma, moderate-severe; Polytrauma, blunt EXAM: CT HEAD WITHOUT CONTRAST CT CERVICAL SPINE WITHOUT CONTRAST TECHNIQUE: Multidetector CT imaging of the head and cervical spine was performed following the standard protocol without intravenous contrast. Multiplanar CT image reconstructions of the cervical spine were also generated. RADIATION DOSE REDUCTION: This exam was performed according to the departmental dose-optimization program which includes automated exposure control, adjustment of the mA and/or kV according to patient size and/or use of iterative reconstruction technique. COMPARISON:  CT head and C-spine 01/02/2023 FINDINGS: CT HEAD FINDINGS Brain: Cerebral ventricle sizes are concordant with the degree of cerebral volume loss. Patchy and confluent areas of decreased attenuation are noted throughout the deep and periventricular white matter of the cerebral hemispheres bilaterally, compatible with chronic  microvascular ischemic disease. Stable left cerebral encephalomalacia involving the MCA territory. Stable left cerebellar encephalomalacia. No evidence of large-territorial acute infarction. No parenchymal hemorrhage. No mass lesion. No extra-axial collection. No mass effect or midline shift. No hydrocephalus. Basilar cisterns are patent. Vascular: No hyperdense vessel. Atherosclerotic calcifications are present within the cavernous internal carotid arteries. Skull: No acute fracture or focal lesion. Sinuses/Orbits: Paranasal sinuses and mastoid air cells are clear. The orbits are unremarkable. Other: None. CT CERVICAL SPINE FINDINGS Alignment: Mild retrolisthesis of C3 on C4. Mild retrolisthesis of C4 on C5. Grade 1 anterolisthesis of C6 on C7. Skull base and vertebrae: Multilevel moderate degenerative changes of the spine. No associated severe osseous neural foraminal stenosis. No acute fracture. No aggressive appearing focal osseous lesion or focal pathologic process. Soft tissues  and spinal canal: No prevertebral fluid or swelling. No visible canal hematoma. Upper chest: Please see separately dictated CT chest 08/30/2023. No pneumothorax. Other: Retropharyngeal carotid arteries within the neck. IMPRESSION: 1. No acute intracranial abnormality. 2. No acute displaced fracture or traumatic listhesis of the cervical spine. Electronically Signed: By: Tish Frederickson M.D. On: 08/30/2023 17:50   CT CHEST ABDOMEN PELVIS W CONTRAST  Result Date: 08/30/2023 CLINICAL DATA:  Polytrauma, blunt.  Fall EXAM: CT CHEST, ABDOMEN, AND PELVIS WITH CONTRAST TECHNIQUE: Multidetector CT imaging of the chest, abdomen and pelvis was performed following the standard protocol during bolus administration of intravenous contrast. RADIATION DOSE REDUCTION: This exam was performed according to the departmental dose-optimization program which includes automated exposure control, adjustment of the mA and/or kV according to patient size  and/or use of iterative reconstruction technique. CONTRAST:  75mL OMNIPAQUE IOHEXOL 350 MG/ML SOLN COMPARISON:  CT abdomen pelvis 09/26/2021 FINDINGS: CHEST: Cardiovascular: No aortic injury. The thoracic aorta is normal in caliber. The heart is enlarged in size. No significant pericardial effusion. Mild atherosclerotic plaque. Main pulmonary artery normal in caliber. Mediastinum/Nodes: No pneumomediastinum. No mediastinal hematoma. The esophagus is unremarkable.  Tiny hiatal hernia. The thyroid is unremarkable. The central airways are patent. No mediastinal, hilar, or axillary lymphadenopathy. Lungs/Pleura: Expiratory phase of respiration. Bilateral lower lobe atelectasis. Mild bronchiectasis and possible mucous plugging of the lower lobes. No focal consolidation. No pulmonary nodule. No pulmonary mass. No pulmonary contusion or laceration. No pneumatocele formation. No pleural effusion. No pneumothorax. No hemothorax. Musculoskeletal/Chest wall: No chest wall mass. No acute rib or sternal fracture. Please see separately dictated CT thoracolumbar spine. Degenerative changes of the right shoulder. ABDOMEN / PELVIS: Hepatobiliary: Not enlarged. No focal lesion. No laceration or subcapsular hematoma. The gallbladder is contracted. Calcified stones within the gallbladder lumen. Calcified stone possibly within the cystic duct. No definite choledocholithiasis associated with the common bile duct. No bowel thickening or pericholecystic fluid. No biliary ductal dilatation. Pancreas: Normal pancreatic contour. No main pancreatic duct dilatation. Spleen: Not enlarged. No focal lesion. No laceration, subcapsular hematoma, or vascular injury. Adrenals/Urinary Tract: No nodularity bilaterally. Bilateral renal cortical scarring. Suggestion of partial left nephrectomy. Ill-defined 2.2 x 1.9 cm left superior renal pole lesion with a density of 43 Hounsfield units. Bilateral kidneys enhance symmetrically. No hydronephrosis. No  contusion, laceration, or subcapsular hematoma. No injury to the vascular structures or collecting systems. No hydroureter. The urinary bladder is unremarkable. On delayed imaging, there is no urothelial wall thickening and there are no filling defects in the opacified portions of the bilateral collecting systems or ureters. Stomach/Bowel: No small or large bowel wall thickening or dilatation. Few scattered colonic diverticula. The appendix is unremarkable. Vasculature/Lymphatics: Mild atherosclerotic plaque. Abdominal aorta or iliac aneurysm. No active contrast extravasation or pseudoaneurysm. No abdominal, pelvic, inguinal lymphadenopathy. Reproductive: Pericentimeter calcified lesion within the left uterine wall likely degenerative uterine fibroid. Otherwise uterus and bilateral adnexal regions are otherwise unremarkable. Other: No simple free fluid ascites. No pneumoperitoneum. No hemoperitoneum. No mesenteric hematoma identified. No organized fluid collection. Musculoskeletal: No significant soft tissue hematoma.  Diastasis rectus noted. No acute pelvic fracture. Please see separately dictated CT thoracolumbar spine. Ports and Devices: None. IMPRESSION: 1. No acute intrathoracic, intra-abdominal, intrapelvic traumatic injury. 2. Please see separately dictated CT thoracolumbar spine 08/30/2023. Other imaging findings of potential clinical significance: 1. Suggestion of partial left nephrectomy with indeterminate ill-defined 2.2 cm left superior renal pole lesion-consider MRI renal protocol. When the patient is clinically stable and able to follow  directions and hold their breath (preferably as an outpatient) further evaluation with dedicated MRI should be considered. 2. Mild bronchiectasis and possible mucous plugging of the lower lobes. 3. Cardiomegaly. 4. Cholelithiasis with limited evaluation due to contracted gallbladder. 5. Colonic diverticulosis with no acute diverticulitis. 6. Degenerative uterine  fibroid. 7. Aortic Atherosclerosis (ICD10-I70.0). Electronically Signed   By: Tish Frederickson M.D.   On: 08/30/2023 18:09   DG Chest Port 1 View  Result Date: 08/30/2023 CLINICAL DATA:  Trauma. EXAM: PORTABLE CHEST 1 VIEW COMPARISON:  09/24/2022. FINDINGS: Bilateral lung fields are clear. Bilateral costophrenic angles are clear. Stable cardio-mediastinal silhouette. No acute osseous abnormalities.  No acute displaced rib fracture. The soft tissues are within normal limits. IMPRESSION: *No active disease.  No acute displaced rib fracture. Electronically Signed   By: Jules Schick M.D.   On: 08/30/2023 16:42   DG Pelvis Portable  Result Date: 08/30/2023 CLINICAL DATA:  Trauma.  Fall.  Patient is taking blood thinners. EXAM: PORTABLE PELVIS 1-2 VIEWS COMPARISON:  09/24/2022. FINDINGS: Pelvis is intact with normal and symmetric sacroiliac joints. No acute fracture or dislocation. No aggressive osseous lesion. Visualized sacral arcuate lines are unremarkable. Unremarkable symphysis pubis. There are mild degenerative changes of bilateral hip joints with mild joint space narrowing and osteophytosis of the superior acetabulum. No radiopaque foreign bodies. IMPRESSION: *No acute osseous abnormality of the pelvis. Electronically Signed   By: Jules Schick M.D.   On: 08/30/2023 16:38    Procedures Procedures    Medications Ordered in ED Medications  iohexol (OMNIPAQUE) 350 MG/ML injection 75 mL (75 mLs Intravenous Contrast Given 08/30/23 1654)    ED Course/ Medical Decision Making/ A&P Clinical Course as of 08/30/23 2350  Wed Aug 30, 2023  2229 Pharmacy: hold x 2 days, INR Friday before restarting [JD]    Clinical Course User Index [JD] Laurence Spates, MD                                 Medical Decision Making Amount and/or Complexity of Data Reviewed Labs: ordered. Radiology: ordered.  Risk Prescription drug management.   Medical Decision Making:   Liala Seales is a 67 y.o.  female who presented to the ED today with unwitnessed fall.  She arrives hemodynamically stable.  She has baseline significant aphasia and right-sided weakness which appears unchanged from prior stroke.  This greatly limits her history.  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  She is somewhat tender in her thoracic spine, given limited history will obtain CT scans for evaluation of traumatic injury.   Patient placed on continuous vitals and telemetry monitoring while in ED which was reviewed periodically.  Reviewed and confirmed nursing documentation for past medical history, family history, social history.  Reassessment and Plan:   Daughter at bedside states patient is at her baseline.  I reviewed her CT imaging, she has renal abnormality which I discussed with daughter she will need an MRI to follow-up for.  No other traumatic injury.  Her labs are notable for hemoglobin similar to prior.  Her INR is supratherapeutic.  Daughter denies any recent change in diet or medications for the patient.  I talked with pharmacy.  They reviewed and discussed with family.  They recommend holding her Coumadin today and tomorrow and having INR rechecked at her facility on Friday.  I discussed  this with the daughter, and put this in her paperwork so the facility can follow-up on Friday to recheck INR before restarting her medication.  Daughter was comfortable this plan.  Discharged in stable condition with return precautions.   Patient's presentation is most consistent with acute complicated illness / injury requiring diagnostic workup.           Final Clinical Impression(s) / ED Diagnoses Final diagnoses:  Fall, initial encounter    Rx / DC Orders ED Discharge Orders     None         Laurence Spates, MD 08/30/23 2350

## 2023-08-30 NOTE — ED Notes (Signed)
ED provider at bedside.

## 2023-08-30 NOTE — ED Notes (Signed)
Primary RN and phlebotomy unable to obtain bloodwork or PIV access; trauma RN, Katy, aware and at bedside attempting US-guided PIV access. ED attending physician, Dr. Earlene Plater, made aware via face-to-face conversation.

## 2023-08-30 NOTE — Progress Notes (Signed)
Orthopedic Tech Progress Note Patient Details:  Caoilinn Vertucci Feb 22, 1956 [id number] Level 2 Trauma  Patient ID: Guerry Bruin, female   DOB: April 09, 1956, 67 y.o.   MRN: [id number]  Smitty Pluck 08/30/2023, 4:16 PM

## 2023-08-30 NOTE — ED Notes (Signed)
Communicated with Rehab facility that pt. Is returning home with her daughter from the ER tonight. Facility confirms understanding and will follow up with daughter tomorrow.

## 2023-08-30 NOTE — Discharge Instructions (Addendum)
The CT scans did not show any injuries.  There was an abnormal finding near her kidney that we will need follow-up as below with MRI.  She is to follow-up with her doctor about this.  Her blood work showed her INR was 3.9 which is too high.  She should not take her Coumadin today or tomorrow and needs to have her INR rechecked at her facility on Friday so they can determine what dose she needs to restart at.  She should return for any new or worsening symptoms.   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 When the patient is clinically stable and able to  follow directions and hold their breath (preferably as an  outpatient) further evaluation with dedicated MRI should be  considered.

## 2023-08-30 NOTE — ED Notes (Signed)
Family member approached RN frustrated stating nothing has been done in over 5 hours. This RN informed family that although the scans have recently resulted, the provider may not have had a chance to review them. Family member continued to escalate. This RN stepped aware and informed provider results are in and family is requesting updates. Provider states he will look a the results and talk with them shortly. This RN relayed this information to family.

## 2023-08-30 NOTE — ED Notes (Signed)
Patient to CT on continuous cardiac and pulse oximetry monitoring with RN.

## 2023-08-30 NOTE — ED Notes (Signed)
Pt. Urinated in brief. Peri care provided, brief changed, and new chux placed.

## 2023-09-12 ENCOUNTER — Telehealth: Payer: Self-pay | Admitting: Oncology

## 2023-09-12 NOTE — Telephone Encounter (Signed)
Pt was scheduled for feb 7th 2025. MD will not be here this day. Appts have been r/s to feb 11th 2025. I called and left vm for pt and mailed out new AVS.

## 2023-10-02 LAB — PROTIME-INR: INR: 2.38 — AB (ref 0.80–1.20)

## 2023-10-16 LAB — PROTIME-INR: INR: 2.41 — AB (ref 0.80–1.20)

## 2023-11-03 LAB — PROTIME-INR: INR: 2.88 — AB (ref 0.80–1.20)

## 2023-11-07 ENCOUNTER — Encounter
Payer: Medicare Other | Attending: Physical Medicine and Rehabilitation | Admitting: Physical Medicine and Rehabilitation

## 2023-11-07 VITALS — BP 136/82 | HR 90

## 2023-11-07 DIAGNOSIS — G8191 Hemiplegia, unspecified affecting right dominant side: Secondary | ICD-10-CM | POA: Insufficient documentation

## 2023-11-07 DIAGNOSIS — I69398 Other sequelae of cerebral infarction: Secondary | ICD-10-CM | POA: Insufficient documentation

## 2023-11-07 DIAGNOSIS — I69351 Hemiplegia and hemiparesis following cerebral infarction affecting right dominant side: Secondary | ICD-10-CM | POA: Diagnosis present

## 2023-11-07 DIAGNOSIS — G894 Chronic pain syndrome: Secondary | ICD-10-CM | POA: Diagnosis present

## 2023-11-07 DIAGNOSIS — M62838 Other muscle spasm: Secondary | ICD-10-CM | POA: Diagnosis not present

## 2023-11-07 DIAGNOSIS — I10 Essential (primary) hypertension: Secondary | ICD-10-CM | POA: Diagnosis present

## 2023-11-07 DIAGNOSIS — R4701 Aphasia: Secondary | ICD-10-CM | POA: Insufficient documentation

## 2023-11-07 DIAGNOSIS — Z79891 Long term (current) use of opiate analgesic: Secondary | ICD-10-CM | POA: Diagnosis present

## 2023-11-07 DIAGNOSIS — Z5181 Encounter for therapeutic drug level monitoring: Secondary | ICD-10-CM | POA: Insufficient documentation

## 2023-11-07 DIAGNOSIS — R252 Cramp and spasm: Secondary | ICD-10-CM | POA: Diagnosis present

## 2023-11-07 MED ORDER — ONABOTULINUMTOXINA 100 UNITS IJ SOLR
300.0000 [IU] | Freq: Once | INTRAMUSCULAR | Status: AC
  Administered 2023-11-07: 300 [IU] via INTRAMUSCULAR

## 2023-11-07 MED ORDER — SODIUM CHLORIDE (PF) 0.9 % IJ SOLN
3.0000 mL | Freq: Once | INTRAMUSCULAR | Status: AC
  Administered 2023-11-07: 3 mL via INTRAVENOUS

## 2023-11-07 NOTE — Addendum Note (Signed)
Addended by: Silas Sacramento T on: 11/07/2023 03:03 PM   Modules accepted: Orders

## 2023-11-07 NOTE — Addendum Note (Signed)
Addended by: Horton Chin on: 11/07/2023 02:41 PM   Modules accepted: Level of Service

## 2023-11-07 NOTE — Progress Notes (Signed)
 Subjective:    Patient ID: Lindsey [name], female    DOB: 01-10-1956, 68 y.o.   MRN: [id number]  HPI: Lindsey [name] is a 68 y.o. female who is here for f/u of her ischemic left MCA stroke, Right Hemiplegia and Essential Hypertension. Lindsey [name] was brought to [facility] ED on 07/202/2021 with right sided weakness and slurred speech.   1) HTN: -Her BP is improved today at 136/82 -She continues to take the amlodipine  and lopressor  and clonidine -All medications reviewed and she does not require refills.  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  -she would like to have this repeated as soon as possible -her daughter would like her to do more PT and OT  3)Aphasia  -improving -did not benefit from aphasia support group, but is interested in our [facility] support group -her daughter would like for her to do SLP agan  Eating well.   Sleeping well.   Using Mirtazepine as needed.   Prior history: She has been working on balance in therapy. She had her SLP evaluation and next week has her OT eval next week. She has been ambulating. She needs assistance to walk and her daughter does not feel comfortable walking with her on her own- right now she ambulates mostly with physical therapy.   She his using the bedside commode. Her daughter has been helping her. She works evening.   Lindsey [name] says things have been going well at home.   Moving bowels regularly.   Her BP is low in office to 100/69. Her daughter has ordered a BP machine.   She does not need any refills this visit.   Denies pain at this time. Has been taking the Gabapentin  BID  Denies depression but does have sadness  Has been eating well.    CT Head WO Contrast:  IMPRESSION: 1. There is a large hypodensity of the anterior left MCA territory, findings consistent with acute to subacute left MCA territory infarction. ASPECTS 4.   2.  No  evidence of hemorrhage.   3. There is mild mass effect on the left lateral ventricle with approximately 3 mm left right midline shift.  CT ANGIO Head/Neck:  IMPRESSION: 1. 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 Acute occlusion inferior division left M2  . 3. No other intracranial stenosis 4. No extracranial stenosis in the carotid or vertebral arteries. 5. These results were called by telephone at the time of interpretation on 04/21/2020 at 6:59 pm to provider JOSHUA LONG , who verbally acknowledged these results.  MRI Brain:  IMPRESSION: Acute infarct left MCA territory there is mild hemorrhage in the infarct which is not seen on recent CT earlier today.   Lindsey [name] was admitted to Inpatient Rehabilitation on 04/30/2020 and discharged home on 05/29/2020. She is receiving Home Health Therapy with [facility].  Lindsey [name] was ale to answer yes and no questions, her speech noted to be Language of confusion at times. This provider spoke with Lindsey [name] daughter Lindsey [name], after assessment, she answered all provider questions and provider answered her questions. [name] was unable to be in room with her mother Lindsey [name] due to being febrile, she was encouraged to F/U with her doctor as well, she verbalizes understanding.   Pain Inventory Average Pain 5 Pain Right Now 0 My pain is intermittent, sharp, burning, tingling and aching  LOCATION OF PAIN    BOWEL Number  of stools per week: 35  Oral laxative use No  Type of laxative  Enema or suppository use No  History of colostomy No  Incontinent No   BLADDER Pads In and out cath, frequency  Able to self cath No  Bladder incontinence Yes  Frequent urination No  Leakage with coughing No  Difficulty starting stream No  Incomplete bladder emptying No    Mobility ability to climb steps?  no do you drive?  no use a wheelchair needs help with transfers - Function disabled: date disabled 04/2020 I need assistance with  the following:  feeding, dressing, bathing, toileting, meal prep, household duties and shopping  Neuro/Psych trouble walking confusion depression  Prior Studies Any changes since last visit?  no  Physicians involved in your care Any changes since last visit?  no   Family History  Problem Relation Age of Onset   Breast cancer Neg Hx    Social History   Socioeconomic History   Marital status: Single    Spouse name: Not on file   Number of children: Not on file   Years of education: Not on file   Highest education level: Not on file  Occupational History   Not on file  Tobacco Use   Smoking status: Never    Passive exposure: Never   Smokeless tobacco: Never  Vaping Use   Vaping status: Never Used  Substance and Sexual Activity   Alcohol use: Not Currently    Comment: states once a wine/beer occassionally    Drug use: No   Sexual activity: Not Currently  Other Topics Concern   Not on file  Social History Narrative   Not on file   Social Drivers of Health   Financial Resource Strain: Low Risk  (03/30/2018)   Received from [facility], [facility]   Overall Financial Resource Strain (CARDIA)    Difficulty of Paying Living Expenses: Not hard at all  Food Insecurity: No Food Insecurity (07/11/2022)   Hunger Vital Sign    Worried About Running Out of Food in the Last Year: Never true    Ran Out of Food in the Last Year: Never true  Transportation Needs: No Transportation Needs (07/11/2022)   PRAPARE - Administrator, Civil Service (Medical): No    Lack of Transportation (Non-Medical): No  Physical Activity: Not on file  Stress: Not on file  Social Connections: Unknown (03/30/2018)   Received from [facility], [facility]   Social Connection and Isolation Panel [NHANES]    Frequency of Communication with Friends and Family: Not on file    Frequency of Social Gatherings with Friends and Family: Not on file    Attends Religious Services: Not  on file    Active Member of Clubs or Organizations: Not on file    Attends Banker Meetings: Not on file    Marital Status: Never married   No past surgical history on file. 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 (HCC)    CVA (cerebral vascular accident) (HCC)    Diverticulosis    History of ischemic left MCA stroke    Hypertension    Pain due to onychomycosis of toenails of both feet    Renal artery thrombosis (HCC)    Uterine prolapse    BP 136/82   Pulse 90   SpO2 96%   Opioid  Risk Score:   Fall Risk Score:  `1  Depression screen [facility] 2/9     08/01/2023    9:47 AM 04/25/2023    9:50 AM 04/25/2023    9:41 AM 01/19/2023    9:55 AM 10/13/2022    9:36 AM 06/30/2022    9:45 AM 11/15/2021   10:42 AM  Depression screen PHQ 2/9  Decreased Interest 0 0 0 0 0 0 1  Down, Depressed, Hopeless 0 0 0 0 0 0 1  PHQ - 2 Score 0 0 0 0 0 0 2   Review of Systems  Constitutional: Negative.   HENT: Negative.    Eyes: Negative.   Respiratory: Negative.    Cardiovascular: Negative.   Gastrointestinal: Negative.   Endocrine: Negative.   Genitourinary: Negative.   Musculoskeletal:  Positive for gait problem.       Spasms  Skin: Negative.   Allergic/Immunologic: Negative.   Neurological:  Positive for weakness.  Psychiatric/Behavioral:  Positive for confusion and dysphoric mood.   All other systems reviewed and are negative.      Objective:   Gen: no distress, normal appearing, BP 162/110 HEENT: oral mucosa pink and moist, NCAT Cardio: Reg rate Chest: normal effort, normal rate of breathing Abd: soft, non-distended Ext: no edema Psych: pleasant, normal affect Skin: intact Neuro: Arrived in wheelchair  Musculoskeletal: Normal Muscle Bulk and Muscle Testing Reveals:  Upper Extremities: Right Upper Extremity: 0/5 strength.  Left Lower Extremity: Full ROM and Muscle  Strength 5/5  Lower Extremities: Right Lower Extremity: 4-/5 HF, KE, 0/5 DF and PF.  Left Lower Extremity: Full ROM and Muscle Strength 5/5 Right sided MAS 2 in elbow flexors, wrist flexors, and finger flexors Severe expressive >receptive aphasia Psych: Pt's affect is appropriate. Pt is cooperative, stable 2/4    Assessment & Plan:  1. Acute Ischemic Left MCA Stroke:Right Hemiplegia: Ordered outpatient OT, PT, and SLP. She has had a HFU appointment with Neurology. Discussed that I can provide new scripts next month if needed. Communicated with her providers regarding positive reinforcement. She has made excellent gains in strength as well as cognition and speech!  -Provided with new disability placard.   2. Essential Hypertension: BP is currently very elevated and discussed with patient that it would be unsafe for her to receive injections today as the injections can temporarily increase her pain, which can increase her blood pressure, and put her at risk for stroke. Discussed starting the medication Valsartan  40mg  daily and logging blood pressures daily at home and she is agreeable. Medications reviewed and she is taking Amlodipine  5mg  and lopressor .  Provided with a list of foods that will help lower her BP.  BP reviewed and improved at 136/82 -continue clonidine -Advised checking BP daily at home and logging results to bring into follow-up appointment with PCP and myself. -Reviewed BP meds today.  -Advised regarding healthy foods that can help lower blood pressure and provided with a list: 1) citrus foods- high in vitamins and minerals 2) salmon and other fatty fish - reduces inflammation and oxylipins 3) swiss chard (leafy green)- high level of nitrates 4) pumpkin seeds- one of the best natural sources of magnesium  5) Beans and lentils- high in fiber, magnesium , and potassium 6) Berries- high in flavonoids 7) Amaranth (whole grain, can be cooked similarly to rice and oats)- high in  magnesium  and fiber 8) Pistachios- even more effective at reducing BP than other nuts 9) Carrots- high in phenolic compounds that relax blood vessels and  reduce inflammation 10) Celery- contain phthalides that relax tissues of arterial walls 11) Tomatoes- can also improve cholesterol and reduce risk of heart disease 12) Broccoli- good source of magnesium , calcium , and potassium 13) Greek yogurt: high in potassium and calcium  14) Herbs and spices: Celery seed, cilantro, saffron, lemongrass, black cumin, ginseng, cinnamon, cardamom, sweet basil, and ginger 15) Chia and flax seeds- also help to lower cholesterol and blood sugar 16) Beets- high levels of nitrates that relax blood vessels  17) spinach and bananas- high in potassium  -Provided lise of supplements that can help with hypertension:  1) magnesium : one high quality brand is Bioptemizers since it contains all 7 types of magnesium , otherwise over the counter magnesium  gluconate 400mg  is a good option 2) B vitamins 3) vitamin D  4) potassium 5) CoQ10 6) L-arginine 7) Vitamin C 8) Beetroot -Educated that goal BP is 120/80. -Made goal to incorporate some of the above foods into diet.    3. Prevention of pressure injury:  -recommended offloading every hour.  -Lying prone and supine in bed at times to offload her sacrum.   4. Appetite:  -improved.  -Encouraged eating nutritious foods and discussed recommendations.  -May stop Mirtazepine.  -Provided with turmeric to add to meals. -Turmeric to reduce inflammation--can be used in cooking or taken as a supplement.  Benefits of turmeric:  -Highly anti-inflammatory  -Increases antioxidants  -Improves memory, attention, brain disease  -Lowers risk of heart disease  -May help prevent cancer  -Decreases pain  -Alleviates depression  -Delays aging and decreases risk of chronic disease  -Consume with black pepper to increase absorption   Turmeric Milk Recipe:  1 cup  milk  1 tsp turmeric  1 tsp cinnamon  1 tsp grated ginger (optional)  Black pepper (boosts the anti-inflammatory properties of turmeric).  1 tsp honey  5) Depression: -Improved, can stop Mirtazepine -Counseled regarding making time for the activities she loves every day.  6) Pain well controlled: may use Gabapentin  as needed.   7) Post-stroke spasticity: will reschedule Botox  to next available appointment, continue baclofen   8)  HLD: advised wild caught salmon once per week and walnuts every day. On 40mg  statin daily.   9) Aphasia: -SLP ordered  All questions were encouraged and answered.  F/U in February.

## 2023-11-07 NOTE — Progress Notes (Signed)
Botox Injection for spasticity, palpation and activation approach used for localization since ultrasound machine was not working   Dilution: 1:1 Indication: Severe spasticity which interferes with ADL,mobility and/or  hygiene and is unresponsive to medication management and other conservative care   Informed consent was obtained after describing risks and benefits of the procedure with the patient. This includes bleeding, bruising, infection, excessive weakness, or medication side effects. A REMS form is on file and signed.  1) Flexor digitorum profundus: 50U in 1 site 2) Flexor carpi radialis: 50U U in 1 site 3) Flexor digitorum superficialis: 50U in 1 site 4) Pronator quadratus: 50U 5) Pronator teres: 50U 6) Biceps: 50U     All injections were done after palpation and patient activation of the muscle confirmed the accurate location, and after negative drawback for blood. The patient tolerated the procedure well. Post procedure instructions were given. A followup appointment was made.

## 2023-11-10 ENCOUNTER — Ambulatory Visit: Payer: Medicare Other | Admitting: Oncology

## 2023-11-10 ENCOUNTER — Other Ambulatory Visit: Payer: Medicare Other

## 2023-11-13 NOTE — Assessment & Plan Note (Addendum)
#  renal artery thrombosis and recurrent stroke 01/28/2022 repeat lupus anticoagulant was negative.  Patient does not have antiphospholipid syndrome. Negative prothrombin gene mutation, negative factor V Leiden mutation.  ANCA is negative.  Per patient and family, INR was checked at nursing home, and Coumadin dosing was managed by primary care provider. Clinically he is doing very well on Coumadin.  Recommend patient to continue Coumadin, goal of INR 2-3

## 2023-11-14 ENCOUNTER — Encounter: Payer: Self-pay | Admitting: Emergency Medicine

## 2023-11-14 ENCOUNTER — Ambulatory Visit: Payer: Medicare Other | Admitting: Physical Therapy

## 2023-11-14 ENCOUNTER — Encounter: Payer: Self-pay | Admitting: Oncology

## 2023-11-14 ENCOUNTER — Telehealth: Payer: Self-pay

## 2023-11-14 ENCOUNTER — Ambulatory Visit: Payer: Medicare Other | Attending: Physical Medicine and Rehabilitation | Admitting: Occupational Therapy

## 2023-11-14 ENCOUNTER — Inpatient Hospital Stay: Payer: Medicare Other | Attending: Oncology

## 2023-11-14 ENCOUNTER — Inpatient Hospital Stay (HOSPITAL_BASED_OUTPATIENT_CLINIC_OR_DEPARTMENT_OTHER): Payer: Medicare Other | Admitting: Oncology

## 2023-11-14 VITALS — BP 170/84 | HR 68 | Temp 99.1°F | Resp 18

## 2023-11-14 DIAGNOSIS — N280 Ischemia and infarction of kidney: Secondary | ICD-10-CM | POA: Diagnosis not present

## 2023-11-14 DIAGNOSIS — M6281 Muscle weakness (generalized): Secondary | ICD-10-CM

## 2023-11-14 DIAGNOSIS — R482 Apraxia: Secondary | ICD-10-CM

## 2023-11-14 DIAGNOSIS — Z7901 Long term (current) use of anticoagulants: Secondary | ICD-10-CM | POA: Diagnosis not present

## 2023-11-14 DIAGNOSIS — R2681 Unsteadiness on feet: Secondary | ICD-10-CM

## 2023-11-14 DIAGNOSIS — R41841 Cognitive communication deficit: Secondary | ICD-10-CM | POA: Insufficient documentation

## 2023-11-14 DIAGNOSIS — R278 Other lack of coordination: Secondary | ICD-10-CM | POA: Insufficient documentation

## 2023-11-14 DIAGNOSIS — R4701 Aphasia: Secondary | ICD-10-CM | POA: Insufficient documentation

## 2023-11-14 DIAGNOSIS — I69398 Other sequelae of cerebral infarction: Secondary | ICD-10-CM | POA: Insufficient documentation

## 2023-11-14 DIAGNOSIS — R269 Unspecified abnormalities of gait and mobility: Secondary | ICD-10-CM | POA: Diagnosis present

## 2023-11-14 DIAGNOSIS — I63512 Cerebral infarction due to unspecified occlusion or stenosis of left middle cerebral artery: Secondary | ICD-10-CM | POA: Insufficient documentation

## 2023-11-14 DIAGNOSIS — R262 Difficulty in walking, not elsewhere classified: Secondary | ICD-10-CM | POA: Insufficient documentation

## 2023-11-14 DIAGNOSIS — D649 Anemia, unspecified: Secondary | ICD-10-CM

## 2023-11-14 DIAGNOSIS — R2689 Other abnormalities of gait and mobility: Secondary | ICD-10-CM | POA: Insufficient documentation

## 2023-11-14 DIAGNOSIS — I63542 Cerebral infarction due to unspecified occlusion or stenosis of left cerebellar artery: Secondary | ICD-10-CM | POA: Diagnosis present

## 2023-11-14 DIAGNOSIS — R252 Cramp and spasm: Secondary | ICD-10-CM | POA: Diagnosis not present

## 2023-11-14 LAB — CBC WITH DIFFERENTIAL (CANCER CENTER ONLY)
Abs Immature Granulocytes: 0.02 10*3/uL (ref 0.00–0.07)
Basophils Absolute: 0 10*3/uL (ref 0.0–0.1)
Basophils Relative: 0 %
Eosinophils Absolute: 0.1 10*3/uL (ref 0.0–0.5)
Eosinophils Relative: 1 %
HCT: 33 % — ABNORMAL LOW (ref 36.0–46.0)
Hemoglobin: 10.7 g/dL — ABNORMAL LOW (ref 12.0–15.0)
Immature Granulocytes: 0 %
Lymphocytes Relative: 25 %
Lymphs Abs: 1.6 10*3/uL (ref 0.7–4.0)
MCH: 27.9 pg (ref 26.0–34.0)
MCHC: 32.4 g/dL (ref 30.0–36.0)
MCV: 85.9 fL (ref 80.0–100.0)
Monocytes Absolute: 0.7 10*3/uL (ref 0.1–1.0)
Monocytes Relative: 11 %
Neutro Abs: 3.9 10*3/uL (ref 1.7–7.7)
Neutrophils Relative %: 63 %
Platelet Count: 294 10*3/uL (ref 150–400)
RBC: 3.84 MIL/uL — ABNORMAL LOW (ref 3.87–5.11)
RDW: 14.5 % (ref 11.5–15.5)
WBC Count: 6.2 10*3/uL (ref 4.0–10.5)
nRBC: 0 % (ref 0.0–0.2)

## 2023-11-14 LAB — CMP (CANCER CENTER ONLY)
ALT: 23 U/L (ref 0–44)
AST: 28 U/L (ref 15–41)
Albumin: 3.6 g/dL (ref 3.5–5.0)
Alkaline Phosphatase: 92 U/L (ref 38–126)
Anion gap: 10 (ref 5–15)
BUN: 17 mg/dL (ref 8–23)
CO2: 26 mmol/L (ref 22–32)
Calcium: 9.4 mg/dL (ref 8.9–10.3)
Chloride: 101 mmol/L (ref 98–111)
Creatinine: 0.81 mg/dL (ref 0.44–1.00)
GFR, Estimated: >60 mL/min (ref >60)
Glucose, Bld: 86 mg/dL (ref 70–99)
Potassium: 4.5 mmol/L (ref 3.5–5.1)
Sodium: 137 mmol/L (ref 135–145)
Total Bilirubin: 0.5 mg/dL (ref 0.0–1.2)
Total Protein: 8.6 g/dL — ABNORMAL HIGH (ref 6.5–8.1)

## 2023-11-14 LAB — RETIC PANEL
Immature Retic Fract: 8.5 % (ref 2.3–15.9)
RBC.: 3.83 MIL/uL — ABNORMAL LOW (ref 3.87–5.11)
Retic Count, Absolute: 47.5 10*3/uL (ref 19.0–186.0)
Retic Ct Pct: 1.2 % (ref 0.4–3.1)
Reticulocyte Hemoglobin: 30 pg (ref >27.9)

## 2023-11-14 LAB — IRON AND TIBC
Iron: 29 ug/dL (ref 28–170)
Saturation Ratios: 10 % — ABNORMAL LOW (ref 10.4–31.8)
TIBC: 302 ug/dL (ref 250–450)
UIBC: 273 ug/dL

## 2023-11-14 LAB — FERRITIN: Ferritin: 236 ng/mL (ref 11–307)

## 2023-11-14 NOTE — Progress Notes (Signed)
Hematology/Oncology Progress note [phone] [phone]         Patient Care Team: Emogene Morgan, MD as PCP - General (Family Medicine) Jim Like, RN as Registered Nurse Scarlett Presto, RN (Inactive) as Registered Nurse Rickard Patience, MD as Consulting Physician (Hematology and Oncology)  CHIEF COMPLAINTS/REASON FOR VISIT:   renal artery thrombosis, chronic anemia.   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  Patient does not have antiphospholipid syndrome. Negative prothrombin gene mutation, negative factor V Leiden mutation.  ANCA is negative.  Per patient and family, INR was checked at nursing home, and Coumadin dosing was managed by primary care provider. Clinically he is doing very well on Coumadin.  Recommend patient to continue Coumadin, goal of INR 2-3   Normocytic anemia Previous workup showed negative M protein on SPEP, slightly increased light chain ratio, nonspecific. Likely anemia of chronic disease.  Hemoglobin is stable. concurrent combination of high serum ferritin and low iron saturation ratio- likely due to chronic inflammation Observation.   Orders Placed This Encounter  Procedures   CBC with Differential (Cancer Center Only)    Standing Status:   Future    Expected Date:   05/13/2024    Expiration Date:   11/13/2024   Iron and TIBC    Standing Status:   Future    Expected Date:   05/13/2024    Expiration Date:   11/13/2024   Ferritin    Standing Status:   Future    Expected Date:   05/13/2024    Expiration Date:   11/13/2024   Follow-up in 76-month All questions were answered. The patient knows to call the clinic with any problems, questions or concerns.  Rickard Patience, MD, PhD [facility] Hematology Oncology 11/14/2023    HISTORY OF PRESENTING ILLNESS:   Lindsey [name] is a  68 y.o.  female with PMH listed below was seen  in consultation at the request of  Aycock, Ngwe A, MD  for evaluation of renal artery thrombosis.  Patient initially presented to emergency room for evaluation of nausea, vomiting, diaphoresis, change of mental status.  Her initial evaluation included CT scan of abdomen pelvis which revealed multifocal pyelonephritis of the right kidney.  Patient was treated with intravenous antibiotics.  At baseline, patient has no history of prior left MCA stroke and right hemiplegia and aphasia.  Patient also has history of atrial fibrillation along has been on Eliquis 5 mg twice daily. 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  There was also concern about seizure activities due to decreased responsiveness and neurology started patient on Keppra.  Patient later was transitioned to Levaquin and discharged to inpatient rehab on 09/24/2021.  There he developed leukocytosis, tachycardia and a fever again.  09/26/2021 repeat CT scan of the abdomen pelvis showed interim development of left renal artery thrombus.  Patient was transferred back to acute care internal medicine.  Antibodies for antiphospholipid syndrome were collected while patient was on heparin. 09/28/2021  lupus anticoagulant positive Anticardiolipin IgG, IgM negative, IgA 20 Beta-2 glycoprotein IgG, IgM, IgA negative  01/28/2022 repeat lupus anticoagulant was negative.  Patient is on chronic anticoagulation with Coumadin with INR goal 2-3.   INTERVAL HISTORY Lindsey [name] is a 68 y.o. female who has above history reviewed by me today presents for  follow up visit for renal artery thrombosis/CVA Patient is accompanied by her daughter.  She is currently on anticoagulation with Coumadin. Coumadin dosing managed by PCP with goal of 2-3 Denies any acute bleeding.  Patient has no new complaints today.  Review of Systems   Unable to perform ROS: Other (Aphasia)  Constitutional:  Negative for appetite change.  Respiratory:  Negative for shortness of breath.   Cardiovascular:  Negative for chest pain.  Gastrointestinal:  Negative for abdominal pain.  Musculoskeletal:  Negative for arthralgias.  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 No pertinent surgical history.  SOCIAL HISTORY: Social History   Socioeconomic History   Marital status: Single    Spouse name: Not on file   Number of children: Not on file   Years of education: Not on file   Highest education level: Not on file  Occupational History   Not on file  Tobacco Use   Smoking status: Never    Passive exposure: Never   Smokeless tobacco: Never  Vaping Use   Vaping status: Never Used  Substance and Sexual Activity   Alcohol use: Not Currently    Comment: states once a wine/beer occassionally    Drug use: No   Sexual activity: Not Currently  Other Topics Concern   Not on file  Social History Narrative   Not on file   Social Drivers of Health   Financial Resource Strain: Low Risk  (03/30/2018)   Received from [facility], [facility]   Overall Financial Resource Strain (CARDIA)    Difficulty of Paying Living Expenses: Not hard at all  Food Insecurity: No Food Insecurity (07/11/2022)   Hunger Vital Sign    Worried About Running Out of Food in the Last Year: Never true    Ran Out of Food in the Last Year: Never true  Transportation Needs: No Transportation Needs (07/11/2022)   PRAPARE -  Administrator, Civil Service (Medical): No    Lack of Transportation (Non-Medical): No  Physical Activity: Not on file  Stress: Not on file  Social Connections: Unknown (03/30/2018)   Received from [facility], [facility]   Social Connection and Isolation Panel [NHANES]    Frequency of Communication with Friends and Family: Not on file    Frequency of Social Gatherings with Friends and Family: Not on file    Attends Religious Services: Not on file    Active Member of Clubs or Organizations: Not on file    Attends Banker Meetings: Not on file    Marital Status: Never married  Intimate Partner Violence: Not At Risk (03/30/2018)   Received from [facility], [facility]   Humiliation, Afraid, Rape, and Kick questionnaire    Fear of Current or Ex-Partner: No    Emotionally Abused: No    Physically Abused: No    Sexually Abused: No    FAMILY HISTORY: Family History  Problem Relation  Age of Onset   Breast cancer Neg Hx     ALLERGIES:  has no known allergies.  MEDICATIONS:  Current Outpatient Medications  Medication Sig Dispense Refill   acetaminophen (TYLENOL) 325 MG tablet Take 1-2 tablets (325-650 mg total) by mouth every 4 (four) hours as needed for mild pain. (Patient taking differently: Take 650 mg by mouth every 4 (four) hours as needed for mild pain (pain score 1-3).)     Baclofen 5 MG TABS Take 1 tablet by mouth at bedtime. 60 tablet 2   cloNIDine (CATAPRES) 0.1 MG tablet Take by mouth daily.     gabapentin (NEURONTIN) 100 MG capsule Take 100 mg by mouth daily.     levETIRAcetam (KEPPRA) 500 MG tablet TAKE 1 TABLET BY MOUTH EVERY 12 HOURS. (Patient taking differently: Take 500 mg by mouth 2 (two) times daily.) 180 tablet 2   losartan (COZAAR) 50 MG tablet Take 50 mg by mouth daily.     metoprolol succinate (TOPROL-XL) 50 MG 24 hr tablet Take 50 mg by mouth 2 (two) times daily.     Multiple Vitamins-Minerals (MULTIVITAMIN ADULTS 50+)  TABS Take 1 tablet by mouth daily.     sertraline (ZOLOFT) 50 MG tablet Take 50 mg by mouth daily.     Vitamin D, Ergocalciferol, (DRISDOL) 1.25 MG (50000 UNIT) CAPS capsule Take 1 capsule (50,000 Units total) by mouth every 7 (seven) days. Each Tuesday (Patient taking differently: Take 50,000 Units by mouth every 7 (seven) days. Each Monday) 12 capsule 0   warfarin (COUMADIN) 1 MG tablet Take 0.5 mg by mouth every evening. In addition to 4mg  tablet every day     warfarin (COUMADIN) 4 MG tablet Take 4 mg by mouth every evening. With 0.5 mg to equal 4.5 mg daily     No current facility-administered medications for this visit.     PHYSICAL EXAMINATION: ECOG PERFORMANCE STATUS: 3 - Symptomatic, >50% confined to bed Vitals:   11/14/23 1126  BP: (!) 170/84  Pulse: 68  Resp: 18  Temp: 99.1 F (37.3 C)   Filed Weights    Physical Exam Constitutional:      General: She is not in acute distress.    Comments: Patient sits in the wheelchair  HENT:     Head: Normocephalic and atraumatic.  Eyes:     General: No scleral icterus. Cardiovascular:     Rate and Rhythm: Normal rate.     Heart sounds: Normal heart sounds.  Pulmonary:     Effort: Pulmonary effort is normal. No respiratory distress.  Abdominal:     General: Bowel sounds are normal.     Palpations: Abdomen is soft.  Musculoskeletal:        General: Normal range of motion.     Cervical back: Normal range of motion and neck supple.     Right lower leg: No edema.  Skin:    General: Skin is warm and dry.     Findings: No rash.  Neurological:     Mental Status: She is alert. Mental status is at baseline.     Comments: Aphasia  Right hemiplegia     LABORATORY DATA:  I have reviewed the data as listed Lab Results  Component Value Date   WBC 6.2 11/14/2023   HGB 10.7 (L) 11/14/2023   HCT 33.0 (L) 11/14/2023   MCV 85.9 11/14/2023   PLT 294 11/14/2023   Recent Labs    05/05/23 1015 08/30/23 1606 08/30/23 1636  11/14/23 1115  NA 138 136 140 137  K 3.8 4.3 4.8 4.5  CL 103 102 103 101  CO2 24 25  --  26  GLUCOSE 82 99 98 86  BUN 19 13 18 17   CREATININE 0.90 0.86 1.00 0.81  CALCIUM 9.3 9.2  --  9.4  GFRNONAA >60 >60  --  >60  PROT 8.3* 8.7*  --  8.6*  ALBUMIN 3.7 3.1*  --  3.6  AST 27 25  --  28  ALT 21 20  --  23  ALKPHOS 85 81  --  92  BILITOT 0.2* 0.4  --  0.5   Iron/TIBC/Ferritin/ %Sat    Component Value Date/Time   IRON 29 11/14/2023 1115   TIBC 302 11/14/2023 1115   FERRITIN 236 11/14/2023 1115   IRONPCTSAT 10 (L) 11/14/2023 1115      RADIOGRAPHIC STUDIES: I have personally reviewed the radiological images as listed and agreed with the findings in the report. No results found.

## 2023-11-14 NOTE — Assessment & Plan Note (Signed)
Previous workup showed negative M protein on SPEP, slightly increased light chain ratio, nonspecific. Likely anemia of chronic disease.  Hemoglobin is stable. concurrent combination of high serum ferritin and low iron saturation ratio- likely due to chronic inflammation Observation.

## 2023-11-14 NOTE — Therapy (Unsigned)
OUTPATIENT PHYSICAL THERAPY NEURO EVALUATION   Patient Name: Lindsey [name] MRN: [id number] DOB:[birth year], 68 y.o., female Today's Date: 11/14/2023   PCP: Emogene Morgan, MD  REFERRING PROVIDER: Horton Chin, MD   END OF SESSION:  PT End of Session - 11/14/23 1607     Visit Number 1    Number of Visits 24    Date for PT Re-Evaluation 02/06/24    PT Start Time 1615    PT Stop Time 1700    PT Time Calculation (min) 45 min    Equipment Utilized During Treatment Gait belt    Activity Tolerance Patient tolerated treatment well    Behavior During Therapy WFL for tasks assessed/performed             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   REFERRING DIAG:   Diagnosis  I69.398,R25.2 (ICD-10-CM) - Spasticity as late effect of cerebrovascular accident (CVA)    THERAPY DIAG:  Abnormality of gait and mobility  Difficulty in walking, not elsewhere classified  Muscle weakness (generalized)  Unsteadiness on feet  Other lack of coordination  Other abnormalities of gait and mobility  Rationale for Evaluation and Treatment: Rehabilitation  SUBJECTIVE:  SUBJECTIVE STATEMENT: Daughter present for Evaluation to provide hx. Pt is familiar to this clinic and has been seen for multiple bouts PT since initial CVA in 2021. Pt d/c'ed from PT serviced in April of 2024 with plans to receive PT services through [facility]. Daughter reports that she was only receiving minimal therapy in facility. Reports at least 1 fall in the last 6 months and daughter asked staff to transfer pt with lift since fall.  Daughter would want pt to demonstrate improve safety with transfers to allow bil transfers with reduced use of rail to allow pt to return home for assisted living.  Unable to discern whether pt in SNF or assisted living care from hx.  Pt accompanied by: family member [name]   PERTINENT HISTORY:  1. Acute Ischemic Left MCA Stroke:Right Hemiplegia: Ordered outpatient OT, PT, and SLP. She has had a HFU appointment with Neurology. Discussed that I can provide new scripts next month if needed. Communicated with her providers regarding positive reinforcement. She has made excellent gains in strength as well as cognition and speech!   -Provided with new disability placard.    2. Essential Hypertension: BP is currently very elevated and discussed with patient that it would be unsafe for her to receive injections today as the injections can temporarily increase her pain, which can  increase her blood pressure, and put her at risk for stroke. Discussed starting the medication Valsartan 40mg  daily and logging blood pressures daily at home and she is agreeable. Medications reviewed and she is taking Amlodipine 5mg  and lopressor.  Provided with a list of foods that will help lower her BP.  BP reviewed and improved at 136/82 -continue clonidine -Advised checking BP daily at home and logging results to bring into follow-up appointment with PCP and myself. -Reviewed BP meds today.  -Advised regarding healthy foods that can help lower blood pressure and provided with a list:  PAIN:  Are you having pain? No  PRECAUTIONS: Fall  RED FLAGS: None   WEIGHT BEARING RESTRICTIONS: No  FALLS: Has patient fallen in last 6 months? Yes. Number of falls 1  LIVING ENVIRONMENT: Lives with: lives with their family and lives in a skilled nursing facility Lives in: Other SNF  Stairs: No Has following equipment at home: Hemi walker and Wheelchair (manual)  PLOF: Requires assistive device for independence, Needs assistance with ADLs, Needs assistance with homemaking, and WC level currently   PATIENT GOALS: move better - walking or transferring with hemi walking   OBJECTIVE:  Note: Objective measures were completed at Evaluation unless otherwise noted.  DIAGNOSTIC FINDINGS:  Head CT:  IMPRESSION: 1. No acute intracranial abnormality. 2. No acute displaced fracture or traumatic listhesis of the cervical spine.  Cervical CT:  MPRESSION: 1. No acute intracranial abnormality. 2. No acute displaced fracture or traumatic listhesis of the cervical spine.  COGNITION: Overall cognitive status: History of cognitive impairments - at baseline   SENSATION: Light touch: Impaired  Proprioception: Impaired  Able to detect depe pressure   COORDINATION: Increased tone on the RLE   EDEMA:  Mild RLE distal edema   MUSCLE TONE: RLE: Mild and Moderate  MUSCLE LENGTH: Hamstrings:  grossly limited    POSTURE: rounded shoulders, forward head, and posterior bias   LOWER EXTREMITY ROM:     Active  Right Eval Left Eval  Hip flexion  Limited to 5 deg beyond 90 in sitting  Knee flexion  90deg in sitting  Knee extension  Lacking 10 deg full  extension  Ankle dorsiflexion  none  Ankle plantarflexion  none   (Blank rows = not tested)  LOWER EXTREMITY MMT:    MMT Right Eval Left Eval  Hip flexion  2+  Hip extension    Hip abduction  2  Hip adduction  3  Hip internal rotation    Hip external rotation    Knee flexion  2+  Knee extension  3+  Ankle dorsiflexion  0  Ankle plantarflexion  0  Ankle inversion    Ankle eversion    (Blank rows = not tested)  BED MOBILITY:  Sit to supine Mod A Supine to sit Mod A Rolling to Right Min A Rolling to Left Min A  TRANSFERS: Assistive device utilized: Hemi walker  Sit to stand: Mod A Stand to sit: Mod A Chair to chair: Mod A Floor:  unable to perform     CURB:  Level of Assistance: Total A Assistive device utilized:  rail Curb Comments: unable to perform   STAIRS: Unable to perform at eval   GAIT: Gait pattern:  non-functional constant posterior bias , step to pattern, decreased stance time- Right, Right hip hike, lateral hip instability, and lateral lean- Left Distance walked: 84ft Assistive device utilized: Hemi walker Level of assistance: Mod A Comments: moderate cues for sequencing of HW and gait pattern in turns   FUNCTIONAL TESTS:  5 times sit to stand: 48 sec with mod Assist from PT  Timed up and go (TUG): unable to perform  10 meter walk test: 6 ft with min-mod assist and HW.  FIST:  Function In Sitting Test (FIST)  (1/2 femur on surface; hips/knees flexed to 90deg)   - indicate bed or mat table / step stool if used  SCORING KEY: 4 = Independent (completes task independently & successfully) 3 = Verbal Cues/Increased Time (completes task independently & successfully and only needs more  time/cues) 2 = Upper Extremity Support (must use UE for support or assistance to complete successfully) 1 = Needs Assistance (unable to complete w/o physical assist; DOCUMENT LEVEL: min, mod, max) 0 = Dependent (requires complete physical assist; unable to complete successfully even w/ physical assist)  Randomly Administer Once Throughout Exam  4 - Anterior Nudge (superior sternum)  4 - Posterior Nudge (between scapular spines)  4 - Lateral Nudge (to dominant side at acromion)     4 - Static sitting (30 seconds)  4 - Sitting, shake 'no' (left and right)  4 - Sitting, eyes closed (30 seconds)   3 - Sitting, lift foot (dominant side, lift foot 1 inch twice)    4 - Pick up object from behind (object at midline, hands breadth posterior)  1 mod assist - Forward reach (use dominant arm, must complete full motion) 1 mod assist - Lateral reach (use dominant arm, clear opposite ischial tuberosity) 1 mod assist  - Pick up object from floor (from between feet)   1 mod assist - Posterior scooting (move backwards 2 inches)  1 min assist  - Anterior scooting (move forward 2 inches)  1 min assist  - Lateral scooting (move to dominant side 2 inches)    TOTAL = 37/56  Notes/comments: apraxia limiting success of scooting and reacting tasks.  MCD > 5 points MCID for IP REHAB > 6 points      PATIENT SURVEYS:  To be completed  TREATMENT DATE: 11/15/2023 Eval only     PATIENT EDUCATION: Education details: POC.  Person educated: Patient and Child(ren) Education method: Explanation Education comprehension: verbalized understanding  HOME EXERCISE PROGRAM: To be provided on visit 2.   GOALS: Goals reviewed with patient? Yes  SHORT TERM GOALS: Target date: 12/13/2023    Patient will be independent in home exercise program to improve strength/mobility for better  functional independence with ADLs. Baseline: to be provided on visit 2  Goal status: INITIAL   LONG TERM GOALS: Target date: 02/07/2024     2.  Patient (> 6 years old) will complete five times sit to stand test by 15 seconds and only min assist indicating an increased LE strength and improved balance. Baseline: 48 with mod assist from PT  Goal status: INITIAL  3.  Patient will increase FIST score by > 6 points to demonstrate decreased fall risk during functional activities Baseline: 37 Goal status: INITIAL  4.  Patient will complete 10 meter walk test to with HW in less than 1 minute with min assist as to improve gait speed for better community ambulation and to reduce fall risk. Baseline: 6 ft with HW and mod assist  Goal status: INITIAL  5.  Patient will perform stand pivot transfer to and from [facility] with HW and CGA for safety to allow safe transfer to toilet with daughter without use of rail on the wall. Baseline: mod assist and heavy use of rail.  Goal status: INITIAL  6.  Patient will improve bed mobility to min assist only to reduce care giver burden and improve safety of transfers  Baseline: mod-max assist Goal status: INITIAL  ASSESSMENT:  CLINICAL IMPRESSION: Patient is a 68 y.o. female who was seen today for physical therapy evaluation and treatment for residual deficits from chronic CVA. Daughter reports that pt has been limited to bed and WC at assisted living and was receiving only minimal skilled PT to address balance and strength deficits. States that she hope to see pt improve safety with transfers and ambulating short distances to allow return home. Pt demonstrates severe balance impairments with FIST 37/56. Mod-max assist sit<>stand and stand pivot transfers with constant posterior LOB. Limited to ambulation of 6 ft only on this day with mod-max assist to prevent posterior LOB< but able to advance the RLE. Pt will benefit from skilled PT to address strength and balance  deficits to improve safety for transfers, reduce care giver burden, and allow return to PLOF and possible return home from Assisted living.   OBJECTIVE IMPAIRMENTS: Abnormal gait, cardiopulmonary status limiting activity, decreased activity tolerance, decreased balance, decreased cognition, decreased coordination, decreased endurance, decreased knowledge of use of DME, decreased mobility, difficulty walking, decreased ROM, decreased strength, hypomobility, increased muscle spasms, impaired flexibility, impaired sensation, impaired tone, impaired UE functional use, improper body mechanics, and postural dysfunction.   ACTIVITY LIMITATIONS: carrying, lifting, bending, standing, squatting, stairs, transfers, bed mobility, continence, bathing, toileting, dressing, reach over head, hygiene/grooming, and locomotion level  PARTICIPATION LIMITATIONS: cleaning, interpersonal relationship, shopping, and community activity  PERSONAL FACTORS: 3+ comorbidities: HTN, chronic CVA, Afib, Lupus   are also affecting patient's functional outcome.   REHAB POTENTIAL: Fair chronic CVA with poor progress in prior interventions with this clinic  CLINICAL DECISION MAKING: Unstable/unpredictable  EVALUATION COMPLEXITY: High  PLAN:  PT FREQUENCY: 1-2x/week  PT DURATION: 12 weeks  PLANNED INTERVENTIONS: 97110-Therapeutic exercises, 97530- Therapeutic activity, [id number]- Neuromuscular re-education, 97535- Self Care, 54098- Manual therapy, [id number]- Gait training, [id number]- Orthotic Fit/training, [id number]-  Aquatic Therapy, [id number]- Splinting, [id number]- Electrical stimulation (unattended), [phone]- Electrical stimulation (manual), Patient/Family education, Balance training, Stair training, Joint mobilization, Vestibular training, Visual/preceptual remediation/compensation, Cognitive remediation, DME instructions, Wheelchair mobility training, Cryotherapy, and Moist heat  PLAN FOR NEXT SESSION:  Provide HEP.  Standing tolerance.    Golden Pop, PT 11/14/2023, 4:10 PM

## 2023-11-14 NOTE — Telephone Encounter (Signed)
Called and spoke to pt's nurse, Jon Gills, at [facility]. Requested for last 3 INR results to be faxed over.

## 2023-11-14 NOTE — Therapy (Signed)
OUTPATIENT OCCUPATIONAL THERAPY NEURO EVALUATION  Patient Name: Lindsey [name] MRN: [id number] DOB:September 06, 1956, 68 y.o., female Today's Date: 11/15/2023  PCP: Emogene Morgan, MD REFERRING PROVIDER: Sula Soda, MD  END OF SESSION:  OT End of Session - 11/14/23 1540     Visit Number 1    Number of Visits 24    Date for OT Re-Evaluation 02/06/24    OT Start Time 1530    OT Stop Time 1615    OT Time Calculation (min) 45 min    Activity Tolerance Patient tolerated treatment well    Behavior During Therapy WFL for tasks assessed/performed             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) 09/24/2021   Cerebral embolism with cerebral infarction 09/23/2021   Pyelonephritis 09/19/2021   Pain due to onychomycosis of toenails of both feet 11/19/2020   Normocytic anemia 05/31/2020   Acute ischemic left MCA stroke (HCC) 04/30/2020   Right hemiplegia (HCC) 04/30/2020   Cerebrovascular accident (HCC) 04/21/2020   Atrial fibrillation with RVR (HCC) 04/21/2020   Essential hypertension 04/21/2020   Alcohol abuse 04/21/2020    ONSET DATE: 09/19/21  REFERRING DIAG: CVA  THERAPY DIAG:  Muscle weakness (generalized)  Rationale  for Evaluation and Treatment: Rehabilitation  SUBJECTIVE:   SUBJECTIVE STATEMENT: Pt. Is motivated to start therapy Pt accompanied by: self, daughter  PERTINENT HISTORY: Pt. is a 68 y.o. 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 Pt. has Right sided weakness, and receptive, and expressive aphasia. Pt. has had a previous CVA with right sided weakness in July 2021. PMHx includes: AFib, Renal Artery thrombosis, situational depression, HTN, Pyelnephritis, Seizure activity, COnstipation, AKI. Vitamin D deficiency, and urinary incontinence   PRECAUTIONS: None  WEIGHT BEARING RESTRICTIONS: No  PAIN:  Are you having pain? 2/10 faces scale, LEs  FALLS: Has patient fallen in last 6 months? Yes. Number of falls 1-ER visit  LIVING ENVIRONMENT: Pt. resides at [facility]. Pt. has very supportive family Stairs: One level Has following equipment at home:  Equipment provided through [employer]  PLOF: Needs assistance with ADLs  PATIENT GOALS:  To improve ROM/flexibility in the RUE 2/2 increased tightness, and to improve ADL functioning in order to towards returning home.  OBJECTIVE:  Note: Objective measures were completed at Evaluation unless otherwise noted.  HAND DOMINANCE: Right  ADLs: Overall ADLs: Assist is provided from the staff at [facility]. Transfers/ambulation related to ADLs: Eating: independent, with set-up using the left hand Grooming: Independent with set-up using the left hand UB Dressing: Increased assist required, Mod-MaxA doffing jacket LB Dressing: Increased assist  required Toileting:  Staff Assists with toileting transfers, and toilet hygiene care. Bathing: Staff assists with bathing Tub Shower transfers: Requires staff assist Equipment: TBD  IADLs: Shopping: N/A Light housekeeping: Provided by staff Meal Prep: 3 Meals a day provided. Pt. Eats in the main dining room when family  visits, otherwise Pt. Eats in her room.  Community mobility: Relies on family for transportation to therapy. Per daughter- Transportation is provided for services not offered at the facility. Medication management: Provided by the facility Financial management: Daughter manages Handwriting: TBD  MOBILITY STATUS: Hx of falls    FUNCTIONAL OUTCOME MEASURES:   UPPER EXTREMITY ROM:    Passive ROM Right Eval  Left Eval AROM WFL  Shoulder flexion 0(46)   Shoulder abduction 0(64)   Shoulder adduction    Shoulder extension    Shoulder internal rotation    Shoulder external rotation    Elbow flexion 0(98)   Elbow extension 0(full passive)   Wrist flexion 0(56)   Wrist extension -56(30)   Wrist ulnar deviation    Wrist radial deviation    Wrist pronation    Wrist supination    (Blank rows = not tested)  UPPER EXTREMITY MMT:     MMT Right eval Left Eval 5/5  Shoulder flexion 0/5   Shoulder abduction 0/5   Shoulder adduction    Shoulder extension    Shoulder internal rotation    Shoulder external rotation    Middle trapezius    Lower trapezius    Elbow flexion 0/5   Elbow extension 0/5   Wrist flexion    Wrist extension 0/5   Wrist ulnar deviation    Wrist radial deviation    Wrist pronation 0/5   Wrist supination 0/5   (Blank rows = not tested)  HAND FUNCTION: N/A  COORDINATION: N/A  SENSATION: Diminished  EDEMA: No edema noted in the right hand today  MUSCLE TONE: impaired  COGNITION: Overall cognitive status: Aphasia  VISION: Wears glasses, no change from baseline   PERCEPTION: TBD  PRAXIS: Impaired: Motor planning                                                                                                                           TREATMENT DATE:   Eval completed, and Pt. education was provided as per below.   PATIENT EDUCATION: Education details: OT services, POC, goals, ADL, and IADL functioning Person educated:  Patient Education method: Explanation, Demonstration, Tactile cues, and Verbal cues Education comprehension: verbalized understanding, returned demonstration, and verbal cues required  HOME EXERCISE PROGRAM: Assess, and provide in an ongoing basis as needed   GOALS: Goals reviewed with patient? Yes  SHORT TERM GOALS: Target date: 12/27/2023    Pt. Will demonstrate supervision with HEPs for BUEs Baseline: Eval: No current HEPs  Goal status: INITIAL  LONG TERM GOALS: Target date: 02/07/2024    1.  Pt. Will improve right shoulder flexion by 20 degrees to assist with UE dressing  Baseline: Eval: Right: 0(46) Goal status: INITIAL  2.  Pt. Will improve with shoulder abduction by 20 degrees to assist with underarm care, and hygiene Baseline: Eval: Right 0(64) Goal status: INITIAL  3.  Pt. Will improve right elbow flexion by 10 degrees to assist with hand to face patterns for gromming Baseline: Eval: Right 0(98) Goal status: INITIAL  4.  Pt. Will elicit active wrist extension, and digit extension in preparation for rubbing lotion into the let forearm, and hand  Baseline:  Goal status: INITIAL  5.  Pt. Will perform UE dressing skills with MinA Baseline: Eval: Mod-MaxA doffing a jacket Goal status: INITIAL  6. Pt. Will require minA toileting transfers/ clothing management  Baseline: Eval: Pt. Requires increased assist for staff  Goal status: INITIAL   ASSESSMENT:  CLINICAL IMPRESSION:  Patient is a 68 y.o. female  who was seen today for occupational therapy evaluation for CVA with right hemiparesis.  Pt. Presents with increased stiffness,a and tightness throughout the Right proximal UE throughout the scapular, and shoulder musculature which significantly restricts her ability to engage her RUE in daily ADL tasks, or have her UE comfortably moved/repositioned for underarm hygiene care tasks, and UE dressing. Pt. Is very motivated to resume therapy. Pt.'s Goal is to return home from  [facility], however in order to do this, the caregiver reports the Pt. needs to be able to independently navigate the short distance needed through the bathroom door to the commode, perform clothing negotiation, and complete toilet hygiene care. Pt. Will  benefit from OT services  to normalize tone in the dominant RUE, improve ROM for underarm hygiene care and UE dressing, as well as to improve toileting care clothing negotiation tasks.  PERFORMANCE DEFICITS: in functional skills including ADLs, IADLs, coordination, dexterity, proprioception, sensation, ROM, strength, pain, Fine motor control, Gross motor control, balance, body mechanics, decreased knowledge of use of DME, and UE functional use, cognitive skills including problem solving, and psychosocial skills including coping strategies, environmental adaptation, and routines and behaviors.   IMPAIRMENTS: are limiting patient from ADLs, IADLs, and leisure.   CO-MORBIDITIES: may have co-morbidities  that affects occupational performance. Patient will benefit from skilled OT to address above impairments and improve overall function.  MODIFICATION OR ASSISTANCE TO COMPLETE EVALUATION: Min-Moderate modification of tasks or assist with assess necessary to complete an evaluation.  OT OCCUPATIONAL PROFILE AND HISTORY: Detailed assessment: Review of records and additional review of physical, cognitive, psychosocial history related to current functional performance.  CLINICAL DECISION MAKING: Moderate - several treatment options, min-mod task modification necessary  REHAB POTENTIAL: Good  EVALUATION COMPLEXITY: Moderate    PLAN:  OT FREQUENCY: 2x/week  OT DURATION: 12 weeks  PLANNED INTERVENTIONS: 97168 OT Re-evaluation, 97535 self care/ADL training, 82956 therapeutic exercise, 97530 therapeutic activity, 97112 neuromuscular re-education, 97140 manual therapy, 97018 paraffin, 21308 moist heat, 97010 cryotherapy, 97034 contrast bath, passive  range of motion, coping strategies training, patient/family education, and DME and/or AE instructions  RECOMMENDED OTHER SERVICES: PT, ST  CONSULTED AND AGREED WITH PLAN OF CARE: Patient  PLAN FOR NEXT SESSION: Initiate treatment Olegario Messier, MS, OTR/L 11/15/2023, 12:07 AM

## 2023-11-15 ENCOUNTER — Encounter: Payer: Medicare Other | Admitting: Speech Pathology

## 2023-11-15 ENCOUNTER — Ambulatory Visit: Payer: Medicare Other | Admitting: Physical Therapy

## 2023-11-16 ENCOUNTER — Ambulatory Visit: Payer: Medicare Other | Admitting: Speech Pathology

## 2023-11-16 ENCOUNTER — Ambulatory Visit: Payer: Medicare Other

## 2023-11-16 DIAGNOSIS — R41841 Cognitive communication deficit: Secondary | ICD-10-CM

## 2023-11-16 DIAGNOSIS — M6281 Muscle weakness (generalized): Secondary | ICD-10-CM

## 2023-11-16 DIAGNOSIS — R2681 Unsteadiness on feet: Secondary | ICD-10-CM

## 2023-11-16 DIAGNOSIS — I63542 Cerebral infarction due to unspecified occlusion or stenosis of left cerebellar artery: Secondary | ICD-10-CM

## 2023-11-16 DIAGNOSIS — R482 Apraxia: Secondary | ICD-10-CM

## 2023-11-16 DIAGNOSIS — R269 Unspecified abnormalities of gait and mobility: Secondary | ICD-10-CM

## 2023-11-16 DIAGNOSIS — R278 Other lack of coordination: Secondary | ICD-10-CM

## 2023-11-16 DIAGNOSIS — R4701 Aphasia: Secondary | ICD-10-CM

## 2023-11-16 DIAGNOSIS — R262 Difficulty in walking, not elsewhere classified: Secondary | ICD-10-CM

## 2023-11-16 NOTE — Therapy (Unsigned)
OUTPATIENT SPEECH LANGUAGE PATHOLOGY APHASIA EVALUATION   Patient Name: Lindsey [name] MRN: [id number] DOB:Feb 21, 1956, 68 y.o., female Today's Date: 11/16/2023  PCP: Karie Fetch, MD REFERRING PROVIDER: Sula Soda, MD   End of Session - 11/16/23 0759     Visit Number 1    Number of Visits 17    Date for SLP Re-Evaluation 01/11/24    Authorization Type Medicare Part A/ Medicare Part B    Progress Note Due on Visit 10    SLP Start Time 0800    SLP Stop Time  0845    SLP Time Calculation (min) 45 min    Activity Tolerance Patient tolerated treatment well             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) 09/24/2021   Cerebral embolism with cerebral infarction 09/23/2021   Pyelonephritis 09/19/2021   Pain due to onychomycosis of toenails of both feet 11/19/2020   Normocytic anemia 05/31/2020   Acute ischemic left MCA stroke (HCC) 04/30/2020   Right hemiplegia (HCC) 04/30/2020   Cerebrovascular accident (HCC) 04/21/2020   Atrial fibrillation with RVR (HCC) 04/21/2020   Essential hypertension 04/21/2020   Alcohol abuse 04/21/2020    ONSET DATE: 04/30/2020 initial CVA; 10/25/2020  second CVA;  11/07/2023 date of  referral  REFERRING DIAG: R47.01 (ICD-10-CM) - Aphasia   THERAPY DIAG:  Aphasia  Apraxia  Verbal apraxia  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 female with the following prior history:  MRI 04/21/2020 Brain: Acute infarct left MCA territory. Infarct involves the left frontal lobe, insula as well as the caudate and putamen. There are areas of hemorrhage within the caudate and putamen as well as in the left frontal lobe.  Scattered small white matter hyperintensities elsewhere compatible with chronic microvascular ischemia. Ventricle size normal. Negative for mass lesion.  Inpatient Rehabilitation ST Services: 04/2020 thru 05/2020 Outpatient ST services 08/2020 thru 08/2021 Pt discharged with Lingraphica Device; per chart using device to assist with communication  MRI 09/22/2021 IMPRESSION: 1. Acute moderate size Left AICA cerebellar infarct. Petechial hemorrhage but no malignant hemorrhagic transformation. Cytotoxic edema but no posterior fossa mass effect at this time.  2. Underlying extensive chronic left MCA territory encephalomalacia related to the 2021 infarct. Associated left brainstem Wallerian degeneration since last year. And small superimposed chronic Left PICA cerebellar infarcts, but also new since last year.   Inpatient Rehabilitation ST Services at [facility]:  10/02/2021 thru 10/21/2021 Outpatient ST Services [facility] 10/2021 thru 03/08/2022 Pt discharged d/t reaching maximal rehabilitation potential; "3/4 LTGs particially met due to limited caregiver availability"  Pt is currently residing at a SNF [facility]). Additional medical history  includes alcoholism,  AFib, Renal Artery thrombosis, situational depression, HTN, Pyelnephritis, Seizure activity, Constipation, AKI. Vitamin D deficiency, and urinary incontinence.   PAIN:  Are you having pain? No  FALLS: Has patient fallen in last 6 months?  See PT evaluation for details  LIVING ENVIRONMENT: Lives with: lives with their family and lives in a skilled nursing facility   PLOF:  Level of assistance: Needed assistance with ADLs, Needed assistance with IADLS   PATIENT GOALS    pt unable to state - her daughter reports she would like for pt to be able to "communicate basics, return to abilities before second stroke (09/2021)"   OBJECTIVE:  COGNITION: Overall cognitive status: History of cognitive impairments - at baseline and Difficulty to assess due to: Communication impairment and severity of deficits Functional deficits: currently pt benefits from support at SNF  AUDITORY COMPREHENSION: Overall auditory comprehension:  impaired - is at baseline - benefits from repetition YES/NO questions:  impaired, considered at baseline Following directions:  impaired, appears at baseline Effective technique: repetition/stressing words and visual/gestural cues   READING COMPREHENSION: able to match printed word with picture of basic objects in field of 2 in 5 out of  opportunities  EXPRESSION: verbal  VERBAL EXPRESSION: Overall verbal expression:  impaired, appears at baseline Level of generative/spontaneous verbalization: word Automatic speech: name: impaired  Repetition:  impaired, at baseline Naming: Confrontation: 0-25% Pragmatics: Impaired: eye contact Interfering components: speech intelligibility Effective technique: sentence completion and imitation/repetition Non-verbal means of communication: N/A  WRITTEN EXPRESSION: Dominant hand: right  Written expression:  not tested d/t RUE impairment  MOTOR SPEECH: Overall motor speech: impaired Level of impairment:  Word Phonation: normal Resonance: WFL Articulation: Impaired: word Intelligibility: Intelligibility reduced Motor planning: Impaired: aware, unaware, groping for words, and consistent   ORAL MOTOR EXAMINATION Facial : Symmetry impaired: Impaired right, Suspect CN VII impairment, Strength impaired: Impaired right, Suspect CN VII impairment Lingual: Symmetry Impaired: Impaired right, Suspect CN XII impairment, Strength Impaired: Impaired right, Suspect CN XII impairment Velum: WFL Mandible: WFL Cough: WFL Voice: WFL    PATIENT REPORTED OUTCOME MEASURES (PROM): To be completed in next 3 sessions   TODAY'S TREATMENT:  Motivational interviewing provided to establish list of communication needs within current living environment. The following were identified:  Need to use bathroom  Hunger/thrist - list of possible drink/food items at facility  Have glasses cleaned  Don't feel well  Brush my teeth  My leg brace is not fitting well  Help with TV remote  Wash my face     PATIENT EDUCATION: Education details: results of this assessment, ST POC Person educated: Patient and Child(ren) Education method: Explanation Education comprehension: needs further education  HOME EXERCISE PROGRAM:   N/A    GOALS:  Goals reviewed with patient? Yes  SHORT TERM GOALS: Target date: 10 sessions  Given verbal or visual prompt, pt will correctly ID written word/object/icon in field of 4-6 to communicate wants/needs with 75% accuracy given occasional mod A over 2 sessions  Baseline: Goal status: INITIAL   LONG TERM GOALS: Target date: 01/11/2024  Pt will utilize multimodal communication to express basic wants/needs as reported by pt and her daughter in 3 out of 5 opportunities.  Baseline:  Goal status: INITIAL   ASSESSMENT:  CLINICAL IMPRESSION: Patient is a 68 y.o. right handed female who was seen today for a speech language evaluation d/t history of Left MCA CVA. 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 Her verbal language Is halting and while she does produce some islands of improved speech intelligibility during naming tasks and spontaneous responses, she speech contains neologisms with evidence of word finding difficulty. Pt's daughter reports that pt doesn't use her Lingraphica device "as much as she should" and it is only available to her during weekends at home for fear that it might be lost at pt's facility.   OBJECTIVE IMPAIRMENTS include attention, aphasia, apraxia, and dysarthria. These impairments are limiting patient from effectively communicating at home and in community. Factors affecting potential to achieve goals and functional outcome are ability to learn/carryover information, co-morbidities, cooperation/participation level, medical prognosis, previous level of function, severity of impairments, family/community support, and time post onset . Patient will benefit from skilled SLP services to address above impairments and improve overall function.  REHAB POTENTIAL: Fair time post onset, multiple courses of ST services, decreased carryover of therapy concepts during previous sessions; pt discharged from previous course of ST services (2023) requiring moderate assistance for communication  PLAN: SLP FREQUENCY: 1-2x/week  SLP DURATION: 8 weeks  PLANNED INTERVENTIONS: Language facilitation, Internal/external aids, Functional tasks, Multimodal communication approach, SLP instruction and feedback, Compensatory strategies, Patient/family education, and 40981 Treatment of speech (30 or 45 min)     Jaslen Adcox B. Dreama Saa, M.S., CCC-SLP, Tree surgeon Certified Brain Injury Specialist [facility]  [facility] Office [phone] Ascom [phone] Fax [phone]

## 2023-11-16 NOTE — Therapy (Signed)
OUTPATIENT PHYSICAL THERAPY NEURO TREATMENT   Patient Name: Lindsey [name] MRN: [id number] DOB:04/12/1956, 68 y.o., female Today's Date: 11/17/2023   PCP: Emogene Morgan, MD  REFERRING PROVIDER: Horton Chin, MD   END OF SESSION:  PT End of Session - 11/16/23 0102     Visit Number 2    Number of Visits 24    Date for PT Re-Evaluation 02/06/24    PT Start Time 0845    PT Stop Time 0930    PT Time Calculation (min) 45 min    Equipment Utilized During Treatment Gait belt    Activity Tolerance Patient tolerated treatment well    Behavior During Therapy WFL for tasks assessed/performed             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 No pertinent surgical history. 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    REFERRING DIAG:  Diagnosis  I69.398,R25.2 (ICD-10-CM) - Spasticity as late effect of cerebrovascular accident (CVA)    THERAPY DIAG:  Abnormality of gait and mobility  Difficulty in walking, not elsewhere classified  Muscle weakness (generalized)  Unsteadiness on feet  Other lack of coordination  Cognitive communication deficit  Rationale for Evaluation and Treatment: Rehabilitation  SUBJECTIVE:  SUBJECTIVE STATEMENT: TODAY: Patient reports no pain and states ready to work. Dtr. Present with patient.     From EVAL: Daughter present for Evaluation to provide hx. Pt is familiar to this clinic and has been seen for multiple bouts PT since initial CVA in 2021. Pt d/c'ed from PT serviced in April of 2024 with plans to receive PT services through [facility]. Daughter reports that she was only receiving minimal therapy in facility. Reports at least 1 fall in the last 6 months and daughter asked staff to transfer pt with lift since fall.  Daughter would want pt to demonstrate improve safety with transfers to allow bil transfers with reduced use of rail to allow pt to return home for assisted living.  Unable to discern whether pt in SNF or assisted living care from hx.    Pt accompanied by: family member [name]   PERTINENT HISTORY:  1. Acute Ischemic Left MCA Stroke:Right Hemiplegia: Ordered outpatient OT, PT, and SLP. She has had a HFU appointment with Neurology. Discussed that I can provide new scripts next month if needed. Communicated with her providers regarding positive reinforcement. She has made excellent gains in strength as well as cognition and speech!   -Provided with new disability placard.    2. Essential Hypertension: BP is currently very elevated and discussed with patient that it  would be unsafe for her to receive injections today as the injections can temporarily increase her pain, which can increase her blood pressure, and put her at risk for stroke. Discussed starting the medication Valsartan 40mg  daily and logging blood pressures daily at home and she is agreeable. Medications reviewed and she is taking Amlodipine 5mg  and lopressor.  Provided with a list of foods that will help lower her BP.  BP reviewed and improved at 136/82 -continue clonidine -Advised checking BP daily at home and logging results to bring into follow-up appointment with PCP and myself. -Reviewed BP meds today.  -Advised regarding healthy foods that can help lower blood pressure and provided with a list:  PAIN:  Are you having pain? No  PRECAUTIONS: Fall  RED FLAGS: None   WEIGHT BEARING RESTRICTIONS: No  FALLS: Has patient fallen in last 6 months? Yes. Number of falls 1  LIVING ENVIRONMENT: Lives with: lives with their family and lives in a skilled nursing facility Lives in: Other SNF  Stairs: No Has following equipment at home: Hemi walker and Wheelchair (manual)  PLOF: Requires assistive device for independence, Needs assistance with ADLs, Needs assistance with homemaking, and WC level currently   PATIENT GOALS: move better - walking or transferring with hemi walking   OBJECTIVE:  Note: Objective measures were completed at Evaluation unless otherwise noted.  DIAGNOSTIC FINDINGS:  Head CT:  IMPRESSION: 1. No acute intracranial abnormality. 2. No acute displaced fracture or traumatic listhesis of the cervical spine.  Cervical CT:  MPRESSION: 1. No acute intracranial abnormality. 2. No acute displaced fracture or traumatic listhesis of the cervical spine.  COGNITION: Overall cognitive status: History of cognitive impairments - at baseline   SENSATION: Light touch: Impaired  Proprioception: Impaired  Able to detect depe pressure   COORDINATION: Increased tone on the  RLE   EDEMA:  Mild RLE distal edema   MUSCLE TONE: RLE: Mild and Moderate  MUSCLE LENGTH: Hamstrings: grossly limited    POSTURE: rounded shoulders, forward head, and posterior bias   LOWER EXTREMITY ROM:     Active  Right Eval Left Eval  Hip flexion  Limited  to 5 deg beyond 90 in sitting  Knee flexion  90deg in sitting  Knee extension  Lacking 10 deg full extension  Ankle dorsiflexion  none  Ankle plantarflexion  none   (Blank rows = not tested)  LOWER EXTREMITY MMT:    MMT Right Eval Left Eval  Hip flexion  2+  Hip extension    Hip abduction  2  Hip adduction  3  Hip internal rotation    Hip external rotation    Knee flexion  2+  Knee extension  3+  Ankle dorsiflexion  0  Ankle plantarflexion  0  Ankle inversion    Ankle eversion    (Blank rows = not tested)  BED MOBILITY:  Sit to supine Mod A Supine to sit Mod A Rolling to Right Min A Rolling to Left Min A  TRANSFERS: Assistive device utilized: Hemi walker  Sit to stand: Mod A Stand to sit: Mod A Chair to chair: Mod A Floor:  unable to perform     CURB:  Level of Assistance: Total A Assistive device utilized:  rail Curb Comments: unable to perform   STAIRS: Unable to perform at eval   GAIT: Gait pattern:  non-functional constant posterior bias , step to pattern, decreased stance time- Right, Right hip hike, lateral hip instability, and lateral lean- Left Distance walked: 29ft Assistive device utilized: Hemi walker Level of assistance: Mod A Comments: moderate cues for sequencing of HW and gait pattern in turns   FUNCTIONAL TESTS:  5 times sit to stand: 48 sec with mod Assist from PT  Timed up and go (TUG): unable to perform  10 meter walk test: 6 ft with min-mod assist and HW.  FIST:  Function In Sitting Test (FIST)  (1/2 femur on surface; hips/knees flexed to 90deg)   - indicate bed or mat table / step stool if used  SCORING KEY: 4 = Independent (completes task independently &  successfully) 3 = Verbal Cues/Increased Time (completes task independently & successfully and only needs more time/cues) 2 = Upper Extremity Support (must use UE for support or assistance to complete successfully) 1 = Needs Assistance (unable to complete w/o physical assist; DOCUMENT LEVEL: min, mod, max) 0 = Dependent (requires complete physical assist; unable to complete successfully even w/ physical assist)  Randomly Administer Once Throughout Exam  4 - Anterior Nudge (superior sternum)  4 - Posterior Nudge (between scapular spines)  4 - Lateral Nudge (to dominant side at acromion)     4 - Static sitting (30 seconds)  4 - Sitting, shake 'no' (left and right)  4 - Sitting, eyes closed (30 seconds)   3 - Sitting, lift foot (dominant side, lift foot 1 inch twice)    4 - Pick up object from behind (object at midline, hands breadth posterior)  1 mod assist - Forward reach (use dominant arm, must complete full motion) 1 mod assist - Lateral reach (use dominant arm, clear opposite ischial tuberosity) 1 mod assist  - Pick up object from floor (from between feet)   1 mod assist - Posterior scooting (move backwards 2 inches)  1 min assist  - Anterior scooting (move forward 2 inches)  1 min assist  - Lateral scooting (move to dominant side 2 inches)    TOTAL = 37/56  Notes/comments: apraxia limiting success of scooting and reacting tasks.  MCD > 5 points MCID for IP REHAB > 6 points      PATIENT SURVEYS:  To be completed  TREATMENT DATE: 11/17/2023   THERAPEUTIC ACTIVITIES: Dynamic Activity Designed to achieve improved functional performance.   -Scooting in chair- attempted several trials- very difficulty for patient to scoot on right side requiring some min assist -Placement of LE/UE- assist with RLE Due to weakness/tightness -Anterior weight shifting in  chair- Some multimodal cues to lean forward -Sit to stand- several attempts- attempting to have patient lead and practice each step - no consistency today- requiring physical assist- moderate assist x approx 10 reps.  -Stand pivot transfer- attempted several trials- gait belt- Patient inconsistent with pivoting toward right or left side- requiring min/mod assist to guide her using hand under hand technique and later with PT using more anterior approach- "Bear hug using gait belt" and patient holding onto back of PT arm with her Left UE.   GAIT TRAINING: Focus on the biomechanics of gait cycle and using appropriate assistive device. Patient ambulated 2 times in session using Hemiwalker, Min assist at trunk and max VC for gait sequencing. Patient with poor initiation of right LE and difficulty with coordination/proprioception of placement of right LE- max distance approx 8 feet x 2 trials.   Self Care/Home management: Instruction in safe patient handling with patient/dtr for transfers and some appropriate standing activities to improve her LE strength and standing ability- Including standing in her bathroom at facility holding onto grab     PATIENT EDUCATION: Education details: POC.  Person educated: Patient and Child(ren) Education method: Explanation Education comprehension: verbalized understanding  HOME EXERCISE PROGRAM: To be provided on visit 2.   GOALS: Goals reviewed with patient? Yes  SHORT TERM GOALS: Target date: 12/13/2023    Patient will be independent in home exercise program to improve strength/mobility for better functional independence with ADLs. Baseline: to be provided on visit 2  Goal status: INITIAL   LONG TERM GOALS: Target date: 02/07/2024     2.  Patient (> 108 years old) will complete five times sit to stand test by 15 seconds and only min assist indicating an increased LE strength and improved balance. Baseline: 48 with mod assist from PT  Goal status:  INITIAL  3.  Patient will increase FIST score by > 6 points to demonstrate decreased fall risk during functional activities Baseline: 37 Goal status: INITIAL  4.  Patient will complete 10 meter walk test to with HW in less than 1 minute with min assist as to improve gait speed for better community ambulation and to reduce fall risk. Baseline: 6 ft with HW and mod assist  Goal status: INITIAL  5.  Patient will perform stand pivot transfer to and from [facility] with HW and CGA for safety to allow safe transfer to toilet with daughter without use of rail on the wall. Baseline: mod assist and heavy use of rail.  Goal status: INITIAL  6.  Patient will improve bed mobility to min assist only to reduce care giver burden and improve safety of transfers  Baseline: mod-max assist Goal status: INITIAL  ASSESSMENT:  CLINICAL IMPRESSION: Patient arrived in good spirits for 2nd visit. Treatment focused on safe patient handling while patient's dtr present including transfers, standing and later even a little walking. Patient presents with significant right sided weakness with some cognitive impairment with poor immediate recall of instruction for safe transfers. She and her daughter will benefit from further training to improve her overall safety for decreased risk of falling. Pt will benefit from skilled PT to address strength and balance deficits to improve safety for transfers,  reduce care giver burden, and allow return to PLOF and possible return home from Assisted living.   OBJECTIVE IMPAIRMENTS: Abnormal gait, cardiopulmonary status limiting activity, decreased activity tolerance, decreased balance, decreased cognition, decreased coordination, decreased endurance, decreased knowledge of use of DME, decreased mobility, difficulty walking, decreased ROM, decreased strength, hypomobility, increased muscle spasms, impaired flexibility, impaired sensation, impaired tone, impaired UE functional use, improper body  mechanics, and postural dysfunction.   ACTIVITY LIMITATIONS: carrying, lifting, bending, standing, squatting, stairs, transfers, bed mobility, continence, bathing, toileting, dressing, reach over head, hygiene/grooming, and locomotion level  PARTICIPATION LIMITATIONS: cleaning, interpersonal relationship, shopping, and community activity  PERSONAL FACTORS: 3+ comorbidities: HTN, chronic CVA, Afib, Lupus   are also affecting patient's functional outcome.   REHAB POTENTIAL: Fair chronic CVA with poor progress in prior interventions with this clinic  CLINICAL DECISION MAKING: Unstable/unpredictable  EVALUATION COMPLEXITY: High  PLAN:  PT FREQUENCY: 1-2x/week  PT DURATION: 12 weeks  PLANNED INTERVENTIONS: 97110-Therapeutic exercises, 97530- Therapeutic activity, [id number]- Neuromuscular re-education, 97535- Self Care, 16109- Manual therapy, [phone]- Gait training, [phone]- Orthotic Fit/training, [phone]- Aquatic Therapy, [phone]- Splinting, 97014- Electrical stimulation (unattended), [phone]- Electrical stimulation (manual), Patient/Family education, Balance training, Stair training, Joint mobilization, Vestibular training, Visual/preceptual remediation/compensation, Cognitive remediation, DME instructions, Wheelchair mobility training, Cryotherapy, and Moist heat  PLAN FOR NEXT SESSION:  Provide HEP.  Standing tolerance.    Lenda Kelp, PT 11/17/2023, 8:36 AM

## 2023-11-21 ENCOUNTER — Ambulatory Visit: Payer: Medicare Other | Admitting: Physical Therapy

## 2023-11-21 ENCOUNTER — Ambulatory Visit: Payer: Medicare Other | Admitting: Occupational Therapy

## 2023-11-21 ENCOUNTER — Ambulatory Visit: Payer: Medicare Other | Admitting: Speech Pathology

## 2023-11-21 DIAGNOSIS — M6281 Muscle weakness (generalized): Secondary | ICD-10-CM

## 2023-11-21 DIAGNOSIS — R278 Other lack of coordination: Secondary | ICD-10-CM

## 2023-11-21 DIAGNOSIS — R2681 Unsteadiness on feet: Secondary | ICD-10-CM

## 2023-11-21 DIAGNOSIS — R269 Unspecified abnormalities of gait and mobility: Secondary | ICD-10-CM

## 2023-11-21 DIAGNOSIS — R262 Difficulty in walking, not elsewhere classified: Secondary | ICD-10-CM

## 2023-11-21 NOTE — Therapy (Signed)
OUTPATIENT PHYSICAL THERAPY NEURO TREATMENT   Patient Name: Lindsey [name] MRN: [id number] DOB:1956-04-13, 68 y.o., female Today's Date: 11/21/2023   PCP: Emogene Morgan, MD  REFERRING PROVIDER: Horton Chin, MD   END OF SESSION:  PT End of Session - 11/21/23 1644     Visit Number 3    Number of Visits 24    Date for PT Re-Evaluation 02/06/24    PT Start Time 1615    PT Stop Time 1647    PT Time Calculation (min) 32 min    Equipment Utilized During Treatment Gait belt    Activity Tolerance Patient tolerated treatment well    Behavior During Therapy WFL for tasks assessed/performed             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   REFERRING DIAG:   Diagnosis  I69.398,R25.2 (ICD-10-CM) - Spasticity as late effect of cerebrovascular accident (CVA)    THERAPY DIAG:  Abnormality of gait and mobility  Difficulty in walking, not elsewhere classified  Muscle weakness (generalized)  Unsteadiness on feet  Other lack of coordination  Rationale for Evaluation and Treatment: Rehabilitation  SUBJECTIVE:  SUBJECTIVE STATEMENT: TODAY: Patient reports no pain and states ready to work. Dtr. Present with patient.     From EVAL: Daughter present for Evaluation to provide hx. Pt is familiar to this clinic and has been seen for multiple bouts PT since initial CVA in 2021. Pt d/c'ed from PT serviced in April of 2024 with plans to receive PT services through [facility]. Daughter reports that she was only receiving minimal therapy in facility. Reports at least 1 fall in the last 6 months and daughter asked staff to transfer pt with lift since fall.  Daughter would want pt to demonstrate improve safety with transfers to allow bil transfers with reduced use of rail to allow pt to return home for assisted living.  Unable to discern whether pt in SNF or assisted living care from hx.    Pt accompanied by: family member [name]   PERTINENT HISTORY:  1. Acute Ischemic Left MCA Stroke:Right Hemiplegia: Ordered outpatient OT, PT, and SLP. She has had a HFU appointment with Neurology. Discussed that I can provide new scripts next month if needed. Communicated with her providers regarding positive reinforcement. She has made excellent gains in strength as well as cognition and speech!   -Provided with new disability placard.    2. Essential Hypertension: BP is currently very elevated and discussed with patient that it would be unsafe for her to receive injections today as  the injections can temporarily increase her pain, which can increase her blood pressure, and put her at risk for stroke. Discussed starting the medication Valsartan 40mg  daily and logging blood pressures daily at home and she is agreeable. Medications reviewed and she is taking Amlodipine 5mg  and lopressor.  Provided with a list of foods that will help lower her BP.  BP reviewed and improved at 136/82 -continue clonidine -Advised checking BP daily at home and logging results to bring into follow-up appointment with PCP and myself. -Reviewed BP meds today.  -Advised regarding healthy foods that can help lower blood pressure and provided with a list:  PAIN:  Are you having pain? No  PRECAUTIONS: Fall  RED FLAGS: None   WEIGHT BEARING RESTRICTIONS: No  FALLS: Has patient fallen in last 6 months? Yes. Number of falls 1  LIVING ENVIRONMENT: Lives with: lives with their family and lives in a skilled nursing facility Lives in: Other SNF  Stairs: No Has following equipment at home: Hemi walker and Wheelchair (manual)  PLOF: Requires assistive device for independence, Needs assistance with ADLs, Needs assistance with homemaking, and WC level currently   PATIENT GOALS: move better - walking or transferring with hemi walking   OBJECTIVE:  Note: Objective measures were completed at Evaluation unless otherwise noted.  DIAGNOSTIC FINDINGS:  Head CT:  IMPRESSION: 1. No acute intracranial abnormality. 2. No acute displaced fracture or traumatic listhesis of the cervical spine.  Cervical CT:  MPRESSION: 1. No acute intracranial abnormality. 2. No acute displaced fracture or traumatic listhesis of the cervical spine.  COGNITION: Overall cognitive status: History of cognitive impairments - at baseline   SENSATION: Light touch: Impaired  Proprioception: Impaired  Able to detect depe pressure   COORDINATION: Increased tone on the RLE   EDEMA:  Mild RLE distal edema   MUSCLE  TONE: RLE: Mild and Moderate  MUSCLE LENGTH: Hamstrings: grossly limited    POSTURE: rounded shoulders, forward head, and posterior bias   LOWER EXTREMITY ROM:     Active  Right Eval Left Eval  Hip flexion  Limited  to 5 deg beyond 90 in sitting  Knee flexion  90deg in sitting  Knee extension  Lacking 10 deg full extension  Ankle dorsiflexion  none  Ankle plantarflexion  none   (Blank rows = not tested)  LOWER EXTREMITY MMT:    MMT Right Eval Left Eval  Hip flexion  2+  Hip extension    Hip abduction  2  Hip adduction  3  Hip internal rotation    Hip external rotation    Knee flexion  2+  Knee extension  3+  Ankle dorsiflexion  0  Ankle plantarflexion  0  Ankle inversion    Ankle eversion    (Blank rows = not tested)  BED MOBILITY:  Sit to supine Mod A Supine to sit Mod A Rolling to Right Min A Rolling to Left Min A  TRANSFERS: Assistive device utilized: Hemi walker  Sit to stand: Mod A Stand to sit: Mod A Chair to chair: Mod A Floor:  unable to perform     CURB:  Level of Assistance: Total A Assistive device utilized:  rail Curb Comments: unable to perform   STAIRS: Unable to perform at eval   GAIT: Gait pattern:  non-functional constant posterior bias , step to pattern, decreased stance time- Right, Right hip hike, lateral hip instability, and lateral lean- Left Distance walked: 14ft Assistive device utilized: Hemi walker Level of assistance: Mod A Comments: moderate cues for sequencing of HW and gait pattern in turns   FUNCTIONAL TESTS:  5 times sit to stand: 48 sec with mod Assist from PT  Timed up and go (TUG): unable to perform  10 meter walk test: 6 ft with min-mod assist and HW.  FIST:  Function In Sitting Test (FIST)  (1/2 femur on surface; hips/knees flexed to 90deg)   - indicate bed or mat table / step stool if used  SCORING KEY: 4 = Independent (completes task independently & successfully) 3 = Verbal Cues/Increased Time  (completes task independently & successfully and only needs more time/cues) 2 = Upper Extremity Support (must use UE for support or assistance to complete successfully) 1 = Needs Assistance (unable to complete w/o physical assist; DOCUMENT LEVEL: min, mod, max) 0 = Dependent (requires complete physical assist; unable to complete successfully even w/ physical assist)  Randomly Administer Once Throughout Exam  4 - Anterior Nudge (superior sternum)  4 - Posterior Nudge (between scapular spines)  4 - Lateral Nudge (to dominant side at acromion)     4 - Static sitting (30 seconds)  4 - Sitting, shake 'no' (left and right)  4 - Sitting, eyes closed (30 seconds)   3 - Sitting, lift foot (dominant side, lift foot 1 inch twice)    4 - Pick up object from behind (object at midline, hands breadth posterior)  1 mod assist - Forward reach (use dominant arm, must complete full motion) 1 mod assist - Lateral reach (use dominant arm, clear opposite ischial tuberosity) 1 mod assist  - Pick up object from floor (from between feet)   1 mod assist - Posterior scooting (move backwards 2 inches)  1 min assist  - Anterior scooting (move forward 2 inches)  1 min assist  - Lateral scooting (move to dominant side 2 inches)    TOTAL = 37/56  Notes/comments: apraxia limiting success of scooting and reacting tasks.  MCD > 5 points MCID for IP REHAB > 6 points      PATIENT SURVEYS:  To be completed  TREATMENT DATE: 11/21/2023  Sit<>stand from [facility] with LUE supported on rail on wall.   Standing tolerance 3 x 1 min with LUE support Weight shift R and L 2x 15 bil with LUE support min assist for full weight shift to the L.  Standing unsupported 2 x 45sec with min assist to prevent posterior bias. Max cues to reduce use of LUE support  Semitandem with LUE support 2 x 30 sec. No UE  support x 15 sec bil.  Reciprocal foot tap with LUE support 2 x 12 with min-mod assist assist for weight shift to advance theRLE.  Side stepping R and L 57ft at rail x 2 bil   Pt noted to have full brief due to bladder incontinence. Pt became perseverative on brief and requested to end PT treatment.  Left sitting in WC at entrance to rehab department. Pt reporting that family will come get her. Offered to take her to entrance of hospital, but pt declined.    PATIENT EDUCATION: Education details: POC.  Person educated: Patient and Child(ren) Education method: Explanation Education comprehension: verbalized understanding  HOME EXERCISE PROGRAM: To be provided on visit 2.   GOALS: Goals reviewed with patient? Yes  SHORT TERM GOALS: Target date: 12/13/2023    Patient will be independent in home exercise program to improve strength/mobility for better functional independence with ADLs. Baseline: to be provided on visit 2  Goal status: INITIAL   LONG TERM GOALS: Target date: 02/07/2024     2.  Patient (> 22 years old) will complete five times sit to stand test by 15 seconds and only min assist indicating an increased LE strength and improved balance. Baseline: 48 with mod assist from PT  Goal status: INITIAL  3.  Patient will increase FIST score by > 6 points to demonstrate decreased fall risk during functional activities Baseline: 37 Goal status: INITIAL  4.  Patient will complete 10 meter walk test to with HW in less than 1 minute with min assist as to improve gait speed for better community ambulation and to reduce fall risk. Baseline: 6 ft with HW and mod assist  Goal status: INITIAL  5.  Patient will perform stand pivot transfer to and from [facility] with HW and CGA for safety to allow safe transfer to toilet with daughter without use of rail on the wall. Baseline: mod assist and heavy use of rail.  Goal status: INITIAL  6.  Patient will improve bed mobility to min assist only  to reduce care giver burden and improve safety of transfers  Baseline: mod-max assist Goal status: INITIAL  ASSESSMENT:  CLINICAL IMPRESSION: Patient arrived in good spirits for 3rd visit. Treatment focused on standing tolerance and improved weight shift to allow pregait movements. Ptnoted to have constant posterior bias with no UE supported on rail, but able to correct for a few seconds with assist/cues from PT. Pt will benefit from skilled PT to address strength and balance deficits to improve safety for transfers, reduce care giver burden, and allow return to PLOF and possible return home from Assisted living.   OBJECTIVE IMPAIRMENTS: Abnormal gait, cardiopulmonary status limiting activity, decreased activity tolerance, decreased balance, decreased cognition, decreased coordination, decreased endurance, decreased knowledge of use of DME, decreased mobility, difficulty walking, decreased ROM, decreased strength, hypomobility, increased muscle spasms, impaired flexibility, impaired sensation, impaired tone, impaired UE functional use, improper body mechanics, and postural dysfunction.   ACTIVITY LIMITATIONS: carrying, lifting, bending, standing, squatting, stairs, transfers, bed mobility, continence, bathing, toileting, dressing,  reach over head, hygiene/grooming, and locomotion level  PARTICIPATION LIMITATIONS: cleaning, interpersonal relationship, shopping, and community activity  PERSONAL FACTORS: 3+ comorbidities: HTN, chronic CVA, Afib, Lupus   are also affecting patient's functional outcome.   REHAB POTENTIAL: Fair chronic CVA with poor progress in prior interventions with this clinic  CLINICAL DECISION MAKING: Unstable/unpredictable  EVALUATION COMPLEXITY: High  PLAN:  PT FREQUENCY: 1-2x/week  PT DURATION: 12 weeks  PLANNED INTERVENTIONS: 97110-Therapeutic exercises, 97530- Therapeutic activity, [id number]- Neuromuscular re-education, 97535- Self Care, 08657- Manual therapy, [phone]  Gait training, [phone]- Orthotic Fit/training, [phone]- Aquatic Therapy, [phone]- Splinting, 97014- Electrical stimulation (unattended), [phone]- Electrical stimulation (manual), Patient/Family education, Balance training, Stair training, Joint mobilization, Vestibular training, Visual/preceptual remediation/compensation, Cognitive remediation, DME instructions, Wheelchair mobility training, Cryotherapy, and Moist heat  PLAN FOR NEXT SESSION:  Provide HEP if daughter present..  Standing tolerance.    Golden Pop, PT 11/21/2023, 5:38 PM

## 2023-11-21 NOTE — Therapy (Signed)
OUTPATIENT OCCUPATIONAL THERAPY NEURO TREATMENT NOTE  Patient Name: Lindsey [name] MRN: [id number] DOB:05-14-1956, 68 y.o., female Today's Date: 11/21/2023  PCP: Emogene Morgan, MD REFERRING PROVIDER: Sula Soda, MD  END OF SESSION:  OT End of Session - 11/21/23 1711     Visit Number 2    Number of Visits 24    Date for OT Re-Evaluation 02/06/24    OT Start Time 1547    OT Stop Time 1615    OT Time Calculation (min) 28 min    Activity Tolerance Patient tolerated treatment well    Behavior During Therapy WFL for tasks assessed/performed             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) 09/24/2021   Cerebral embolism with cerebral infarction 09/23/2021   Pyelonephritis 09/19/2021   Pain due to onychomycosis of toenails of both feet 11/19/2020   Normocytic anemia 05/31/2020   Acute ischemic left MCA stroke (HCC) 04/30/2020   Right hemiplegia (HCC) 04/30/2020   Cerebrovascular accident (HCC) 04/21/2020   Atrial fibrillation with RVR (HCC) 04/21/2020   Essential hypertension 04/21/2020   Alcohol abuse 04/21/2020    ONSET DATE: 09/19/21  REFERRING DIAG: CVA  THERAPY DIAG:  Muscle weakness (generalized)  Other  lack of coordination  Rationale for Evaluation and Treatment: Rehabilitation  SUBJECTIVE:   SUBJECTIVE STATEMENT: Pt. Is motivated to start therapy Pt accompanied by: self, daughter  PERTINENT HISTORY: Pt. is a 68 y.o. 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 Pt. has Right sided weakness, and receptive, and expressive aphasia. Pt. has had a previous CVA with right sided weakness in July 2021. PMHx includes: AFib, Renal Artery thrombosis, situational depression, HTN, Pyelnephritis, Seizure activity, COnstipation, AKI. Vitamin D deficiency, and urinary incontinence   PRECAUTIONS: None  WEIGHT BEARING RESTRICTIONS: No  PAIN:  Are you having pain? 2/10 faces scale, LEs  FALLS: Has patient fallen in last 6 months? Yes. Number of falls 1-ER visit  LIVING ENVIRONMENT: Pt. resides at [facility]. Pt. has very supportive family Stairs: One level Has following equipment at home:  Equipment provided through [employer]  PLOF: Needs assistance with ADLs  PATIENT GOALS:  To improve ROM/flexibility in the RUE 2/2 increased tightness, and to improve ADL functioning in order to towards returning home.  OBJECTIVE:  Note: Objective measures were completed at Evaluation unless otherwise noted.  HAND DOMINANCE: Right  ADLs: Overall ADLs: Assist is provided from the staff at [facility]. Transfers/ambulation related to ADLs: Eating: independent, with set-up using the left hand Grooming: Independent with set-up using the left hand UB Dressing: Increased assist required, Mod-MaxA  doffing jacket LB Dressing: Increased assist required Toileting:  Staff Assists with toileting transfers, and toilet hygiene care. Bathing: Staff assists with bathing Tub Shower transfers: Requires staff assist Equipment: TBD  IADLs: Shopping: N/A Light housekeeping: Provided by staff Meal Prep: 3 Meals a day provided. Pt. Eats in  the main dining room when family visits, otherwise Pt. Eats in her room.  Community mobility: Relies on family for transportation to therapy. Per daughter- Transportation is provided for services not offered at the facility. Medication management: Provided by the facility Financial management: Daughter manages Handwriting: TBD  MOBILITY STATUS: Hx of falls    FUNCTIONAL OUTCOME MEASURES:   UPPER EXTREMITY ROM:    Passive ROM Right Eval  Left Eval AROM WFL  Shoulder flexion 0(46)   Shoulder abduction 0(64)   Shoulder adduction    Shoulder extension    Shoulder internal rotation    Shoulder external rotation    Elbow flexion 0(98)   Elbow extension 0(full passive)   Wrist flexion 0(56)   Wrist extension -56(30)   Wrist ulnar deviation    Wrist radial deviation    Wrist pronation    Wrist supination    (Blank rows = not tested)  UPPER EXTREMITY MMT:     MMT Right eval Left Eval 5/5  Shoulder flexion 0/5   Shoulder abduction 0/5   Shoulder adduction    Shoulder extension    Shoulder internal rotation    Shoulder external rotation    Middle trapezius    Lower trapezius    Elbow flexion 0/5   Elbow extension 0/5   Wrist flexion    Wrist extension 0/5   Wrist ulnar deviation    Wrist radial deviation    Wrist pronation 0/5   Wrist supination 0/5   (Blank rows = not tested)  HAND FUNCTION: N/A  COORDINATION: N/A  SENSATION: Diminished  EDEMA: No edema noted in the right hand today  MUSCLE TONE: impaired  COGNITION: Overall cognitive status: Aphasia  VISION: Wears glasses, no change from baseline   PERCEPTION: TBD  PRAXIS: Impaired: Motor planning                                                                                                                           TREATMENT DATE:   Moist heat modality to the right shoulder, elbow, forearm, and wrist prior to Manual therapy, and there. Ex./ROM.   Manual therapy:  -Soft tissue massage  to the scapular, and right shoulder musculature. -Soft tissue mobilizations to promote radius on ulna motion needed for supination.  -Manual therapy was performed independent of, and in preparation for therapeutic Ex.    There. Ex:  -PROM through all joint ranges of the RUE for shoulder flexion, abduction, horizontal abduction, elbow flexion, extension, forearm supination, wrist extension, digit extension, and thumb abduction.  -Education was provided about self-PROM with hand-over-hand assist   PATIENT EDUCATION: Education details: OT services, POC, goals, ADL, and IADL functioning Person  educated: Patient Education method: Explanation, Demonstration, Tactile cues, and Verbal cues Education comprehension: verbalized understanding, returned demonstration, and verbal cues required  HOME EXERCISE PROGRAM: Assess, and provide in an ongoing basis as needed   GOALS: Goals reviewed with patient? Yes  SHORT TERM GOALS: Target date: 12/27/2023    Pt. Will demonstrate supervision with HEPs for BUEs Baseline: Eval: No current HEPs  Goal status: INITIAL  LONG TERM GOALS: Target date: 02/07/2024    1.  Pt. Will improve right shoulder flexion by 20 degrees to assist with UE dressing Baseline: Eval: Right: 0(46) Goal status: INITIAL  2.  Pt. Will improve with shoulder abduction by 20 degrees to assist with underarm care, and hygiene Baseline: Eval: Right 0(64) Goal status: INITIAL  3.  Pt. Will improve right elbow flexion by 10 degrees to assist with hand to face patterns for gromming Baseline: Eval: Right 0(98) Goal status: INITIAL  4.  Pt. Will elicit active wrist extension, and digit extension in preparation for rubbing lotion into the let forearm, and hand  Baseline:  Goal status: INITIAL  5.  Pt. Will perform UE dressing skills with MinA Baseline: Eval: Mod-MaxA doffing a jacket Goal status: INITIAL  6. Pt. Will require minA toileting transfers/ clothing  management  Baseline: Eval: Pt. Requires increased assist for staff  Goal status: INITIAL   ASSESSMENT:  CLINICAL IMPRESSION:  Pt. was late for the therapy appointment this afternoon. Pt. tolerated the moist heat, manual therapy, and ROM well today. No reports of pain, or discomfort. Pt. presents with increased flexor tone, and tightness proximally through the scapular, and shoulder musculature. As well as through the wrist, and digits. Pt. continues to benefit from OT services to normalize tone in the dominant RUE, improve ROM for underarm hygiene care and UE dressing, as well as to improve toileting care clothing negotiation tasks.  PERFORMANCE DEFICITS: in functional skills including ADLs, IADLs, coordination, dexterity, proprioception, sensation, ROM, strength, pain, Fine motor control, Gross motor control, balance, body mechanics, decreased knowledge of use of DME, and UE functional use, cognitive skills including problem solving, and psychosocial skills including coping strategies, environmental adaptation, and routines and behaviors.   IMPAIRMENTS: are limiting patient from ADLs, IADLs, and leisure.   CO-MORBIDITIES: may have co-morbidities  that affects occupational performance. Patient will benefit from skilled OT to address above impairments and improve overall function.  MODIFICATION OR ASSISTANCE TO COMPLETE EVALUATION: Min-Moderate modification of tasks or assist with assess necessary to complete an evaluation.  OT OCCUPATIONAL PROFILE AND HISTORY: Detailed assessment: Review of records and additional review of physical, cognitive, psychosocial history related to current functional performance.  CLINICAL DECISION MAKING: Moderate - several treatment options, min-mod task modification necessary  REHAB POTENTIAL: Good  EVALUATION COMPLEXITY: Moderate    PLAN:  OT FREQUENCY: 2x/week  OT DURATION: 12 weeks  PLANNED INTERVENTIONS: 97168 OT Re-evaluation, 97535 self care/ADL  training, 09811 therapeutic exercise, 97530 therapeutic activity, 97112 neuromuscular re-education, 97140 manual therapy, 97018 paraffin, 91478 moist heat, 97010 cryotherapy, 97034 contrast bath, passive range of motion, coping strategies training, patient/family education, and DME and/or AE instructions  RECOMMENDED OTHER SERVICES: PT, ST  CONSULTED AND AGREED WITH PLAN OF CARE: Patient  PLAN FOR NEXT SESSION: Initiate treatment Olegario Messier, MS, OTR/L 11/21/2023, 5:17 PM

## 2023-11-23 ENCOUNTER — Ambulatory Visit: Payer: Medicare Other

## 2023-11-23 ENCOUNTER — Ambulatory Visit: Payer: Medicare Other | Admitting: Speech Pathology

## 2023-11-28 ENCOUNTER — Ambulatory Visit: Payer: Medicare Other

## 2023-11-28 ENCOUNTER — Ambulatory Visit: Payer: Medicare Other | Admitting: Physical Therapy

## 2023-11-28 ENCOUNTER — Ambulatory Visit: Payer: Medicare Other | Admitting: Speech Pathology

## 2023-11-28 DIAGNOSIS — R482 Apraxia: Secondary | ICD-10-CM

## 2023-11-28 DIAGNOSIS — R2681 Unsteadiness on feet: Secondary | ICD-10-CM

## 2023-11-28 DIAGNOSIS — I63512 Cerebral infarction due to unspecified occlusion or stenosis of left middle cerebral artery: Secondary | ICD-10-CM

## 2023-11-28 DIAGNOSIS — R278 Other lack of coordination: Secondary | ICD-10-CM

## 2023-11-28 DIAGNOSIS — M6281 Muscle weakness (generalized): Secondary | ICD-10-CM | POA: Diagnosis not present

## 2023-11-28 DIAGNOSIS — R269 Unspecified abnormalities of gait and mobility: Secondary | ICD-10-CM

## 2023-11-28 DIAGNOSIS — R262 Difficulty in walking, not elsewhere classified: Secondary | ICD-10-CM

## 2023-11-28 DIAGNOSIS — R4701 Aphasia: Secondary | ICD-10-CM

## 2023-11-28 DIAGNOSIS — R41841 Cognitive communication deficit: Secondary | ICD-10-CM

## 2023-11-28 NOTE — Therapy (Signed)
 OUTPATIENT SPEECH LANGUAGE PATHOLOGY  TREATMENT NOTE   Patient Name: Lindsey [name] MRN: [id number] DOB:1956-08-11, 68 y.o., female Today's Date: 11/28/2023  PCP: Karie Fetch, MD REFERRING PROVIDER: Sula Soda, MD   End of Session - 11/28/23 1011     Visit Number 2    Number of Visits 17    Date for SLP Re-Evaluation 01/11/24    Authorization Type Medicare Part A/ Medicare Part B    Progress Note Due on Visit 10    SLP Start Time 0930    SLP Stop Time  1010    SLP Time Calculation (min) 40 min    Activity Tolerance Patient tolerated treatment well             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) 09/24/2021   Cerebral embolism with cerebral infarction 09/23/2021   Pyelonephritis 09/19/2021   Pain due to onychomycosis of toenails of both feet 11/19/2020   Normocytic anemia 05/31/2020   Acute ischemic left MCA stroke (HCC) 04/30/2020   Right hemiplegia (HCC) 04/30/2020   Cerebrovascular accident (HCC) 04/21/2020   Atrial fibrillation with RVR (HCC) 04/21/2020   Essential hypertension 04/21/2020   Alcohol abuse 04/21/2020    ONSET DATE: 04/30/2020 initial CVA; 10/25/2020  second CVA;  11/07/2023 date of  referral  REFERRING DIAG: R47.01 (ICD-10-CM) - Aphasia   THERAPY DIAG:  Aphasia  Apraxia  Cognitive communication deficit  Rationale for Evaluation and Treatment Rehabilitation  SUBJECTIVE:   PERTINENT HISTORY and DIAGNOSTIC FINDINGS:   Patient is a 68 y.o. female with the following prior history:  MRI 04/21/2020 Brain: Acute infarct left MCA territory. Infarct involves the left frontal lobe, insula as well as the caudate and putamen. There are areas of hemorrhage within the caudate and putamen as well as in the left frontal lobe.  Scattered small white matter hyperintensities elsewhere compatible with chronic microvascular ischemia. Ventricle size normal. Negative for mass lesion.  Inpatient Rehabilitation ST Services: 04/2020 thru 05/2020 Outpatient ST services 08/2020 thru 08/2021 Pt discharged with Lingraphica Device; per chart using device to assist with communication  MRI 09/22/2021 IMPRESSION: 1. Acute moderate size Left AICA cerebellar infarct. Petechial hemorrhage but no malignant hemorrhagic transformation. Cytotoxic edema but no posterior fossa mass effect at this time.  2. Underlying extensive chronic left MCA territory encephalomalacia related to the 2021 infarct. Associated left brainstem Wallerian degeneration since last year. And small superimposed chronic Left PICA cerebellar infarcts, but also new since last year.   Inpatient Rehabilitation ST Services at [facility]: 10/02/2021 thru 10/21/2021 Outpatient ST Services [facility] 10/2021 thru 03/08/2022 Pt discharged d/t reaching maximal rehabilitation potential; "3/4 LTGs particially met due to limited caregiver availability"  Pt is  currently residing at a SNF [facility]). Additional medical history includes alcoholism,  AFib, Renal Artery thrombosis, situational depression, HTN, Pyelnephritis, Seizure activity, Constipation, AKI. Vitamin D deficiency, and urinary incontinence.   PAIN:  Are you having  pain? No  FALLS: Has patient fallen in last 6 months?  See PT evaluation for details  LIVING ENVIRONMENT: Lives with: lives with their family and lives in a skilled nursing facility   PLOF:  Level of assistance: Needed assistance with ADLs, Needed assistance with IADLS   PATIENT GOALS    pt unable to state - her daughter reports she would like for pt to be able to "communicate basics, return to abilities before second stroke (09/2021)"  SUBJECTIVE STATEMENT: To pleasant, eager, answers mostly "yes" to questions Pt accompanied by: family member; pt's daughter   OBJECTIVE:   TODAY'S TREATMENT:     Skilled treatment session focused on evaluating pt for AAC potential speech generative device as well as creation of icons.    LINGRAPHICA DEVICE ASSESSMENT TOOL  Physical Assessment:  - Touch Accuracy Largest Size - HIT     Large Size - HIT     Normal Size - HIT     Small Size - HIT     Smallest Size - HIT   - Vision [employer] Lower Right Corner - HIT     Upper Left Corner - HIT     Lower Left Corner- HIT     Upper Right Corner - HIT     Middle - HIT Language Assessment  Phrase Completion - HIGH  Categorization - HIGH Repetition - LOW Confrontational Naming - LOW Conversational Speech - LOW  Device Simulation  Simple Simulation - NOT YET COMPLETED  Complex Simulation - NOT YET COMPLETED  SLP further facilitated session by presenting pt with printed pictures of actions such as glasses to indicate communications "please clean my glasses." Pt primarily answered "yes" to most questions with pt's daughter helpful in establishing some basic items that pt enjoys drinking  SLP also spoke with pt and her daughter about potential for a wheelchair mount of pt's SGD and/or air tag to prevent device from potentially becoming missing    PATIENT REPORTED OUTCOME MEASURES (PROM): To be completed in next 3 sessions    PATIENT EDUCATION: Education details: results of this  assessment, ST POC Person educated: Patient and Child(ren) Education method: Explanation Education comprehension: needs further education  HOME EXERCISE PROGRAM:   N/A    GOALS:  Goals reviewed with patient? Yes  SHORT TERM GOALS: Target date: 10 sessions  Given verbal or visual prompt, pt will correctly ID written word/object/icon in field of 4-6 to communicate wants/needs with 75% accuracy given occasional mod A over 2 sessions  Baseline: Goal status: INITIAL   LONG TERM GOALS: Target date: 01/11/2024  Pt will utilize multimodal communication to express basic wants/needs as reported by pt and her daughter in 3 out of 5 opportunities.  Baseline:  Goal status: INITIAL   ASSESSMENT:  CLINICAL IMPRESSION: Patient is a 68 y.o. right handed female who was seen today for a speech language treatment d/t history of Left MCA CVA. 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 Her verbal language Is halting and while she does produce some islands of improved speech intelligibility during naming tasks and spontaneous responses, she speech contains neologisms with evidence of word finding difficulty. Pt's daughter reports that pt doesn't  use her Lingraphica device "as much as she should" and it is only available to her during weekends at home for fear that it might be lost at pt's facility.   Pt engaged in creating some pictures to represent activities. Prognosis is guarded for use of communication as pt becomes overwhelmed/emotional during discussion d/t severity of expressive language deficits. See the above treatment note for details.   OBJECTIVE IMPAIRMENTS include attention, aphasia, apraxia, and dysarthria. These impairments are limiting patient from effectively communicating at home and in community. Factors affecting potential to achieve goals and functional outcome are ability to  learn/carryover information, co-morbidities, cooperation/participation level, medical prognosis, previous level of function, severity of impairments, family/community support, and time post onset . Patient will benefit from skilled SLP services to address above impairments and improve overall function.  REHAB POTENTIAL: Fair time post onset, multiple courses of ST services, decreased carryover of therapy concepts during previous sessions; pt discharged from previous course of ST services (2023) requiring moderate assistance for communication  PLAN: SLP FREQUENCY: 1-2x/week  SLP DURATION: 8 weeks  PLANNED INTERVENTIONS: Language facilitation, Internal/external aids, Functional tasks, Multimodal communication approach, SLP instruction and feedback, Compensatory strategies, Patient/family education, and [id number] Treatment of speech (30 or 45 min)     Lakeyia Surber B. Dreama Saa, M.S., CCC-SLP, Tree surgeon Certified Brain Injury Specialist [facility]  [facility] Rehabilitation Services Office [phone] Ascom [phone] Fax [phone]

## 2023-11-28 NOTE — Therapy (Signed)
 OUTPATIENT PHYSICAL THERAPY NEURO TREATMENT   Patient Name: Lindsey [name] MRN: [id number] DOB:07/08/1956, 68 y.o., female Today's Date: 11/28/2023   PCP: Emogene Morgan, MD  REFERRING PROVIDER: Horton Chin, MD   END OF SESSION:  PT End of Session - 11/28/23 1018     Visit Number 4    Number of Visits 24    Date for PT Re-Evaluation 02/06/24    PT Start Time 1020    PT Stop Time 1100    PT Time Calculation (min) 40 min    Equipment Utilized During Treatment Gait belt    Activity Tolerance Patient tolerated treatment well    Behavior During Therapy WFL for tasks assessed/performed             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   REFERRING DIAG:   Diagnosis  I69.398,R25.2 (ICD-10-CM) - Spasticity as late effect of cerebrovascular accident (CVA)    THERAPY DIAG:  Apraxia  Abnormality of gait and mobility  Difficulty in walking, not elsewhere classified  Muscle weakness (generalized)  Unsteadiness on feet  Other lack of coordination  Rationale for Evaluation and Treatment: Rehabilitation  SUBJECTIVE:  SUBJECTIVE STATEMENT: TODAY: Patient reports no pain and states ready to work. Dtr. Present with patient. Daughter reports that she will continue HEP at home daily when not in PT.    From EVAL: Daughter present for Evaluation to provide hx. Pt is familiar to this clinic and has been seen for multiple bouts PT since initial CVA in 2021. Pt d/c'ed from PT serviced in April of 2024 with plans to receive PT services through [facility]. Daughter reports that she was only receiving minimal therapy in facility. Reports at least 1 fall in the last 6 months and daughter asked staff to transfer pt with lift since fall.  Daughter would want pt to demonstrate improve safety with transfers to allow bil transfers with reduced use of rail to allow pt to return home for assisted living.  Unable to discern whether pt in SNF or assisted living care from hx.    Pt accompanied by: family member [name]   PERTINENT HISTORY:  1. Acute Ischemic Left MCA Stroke:Right Hemiplegia: Ordered outpatient OT, PT, and SLP. She has had a HFU appointment with Neurology. Discussed that I can provide new scripts next month if needed. Communicated with her providers regarding positive reinforcement. She has made excellent gains in strength as well as cognition and speech!   -Provided with new disability placard.    2. Essential Hypertension: BP is currently very elevated and  discussed with patient that it would be unsafe for her to receive injections today as the injections can temporarily increase her pain, which can increase her blood pressure, and put her at risk for stroke. Discussed starting the medication Valsartan 40mg  daily and logging blood pressures daily at home and she is agreeable. Medications reviewed and she is taking Amlodipine 5mg  and lopressor.  Provided with a list of foods that will help lower her BP.  BP reviewed and improved at 136/82 -continue clonidine -Advised checking BP daily at home and logging results to bring into follow-up appointment with PCP and myself. -Reviewed BP meds today.  -Advised regarding healthy foods that can help lower blood pressure and provided with a list:  PAIN:  Are you having pain? No  PRECAUTIONS: Fall  RED FLAGS: None   WEIGHT BEARING RESTRICTIONS: No  FALLS: Has patient fallen in last 6 months? Yes. Number of falls 1  LIVING ENVIRONMENT: Lives with: lives with their family and lives in a skilled nursing facility Lives in: Other SNF  Stairs: No Has following equipment at home: Hemi walker and Wheelchair (manual)  PLOF: Requires assistive device for independence, Needs assistance with ADLs, Needs assistance with homemaking, and WC level currently   PATIENT GOALS: move better - walking or transferring with hemi walking   OBJECTIVE:  Note: Objective measures were completed at Evaluation unless otherwise noted.  DIAGNOSTIC FINDINGS:  Head CT:  IMPRESSION: 1. No acute intracranial abnormality. 2. No acute displaced fracture or traumatic listhesis of the cervical spine.  Cervical CT:  MPRESSION: 1. No acute intracranial abnormality. 2. No acute displaced fracture or traumatic listhesis of the cervical spine.  COGNITION: Overall cognitive status: History of cognitive impairments - at baseline   SENSATION: Light touch: Impaired  Proprioception: Impaired  Able to detect depe pressure    COORDINATION: Increased tone on the RLE   EDEMA:  Mild RLE distal edema   MUSCLE TONE: RLE: Mild and Moderate  MUSCLE LENGTH: Hamstrings: grossly limited    POSTURE: rounded shoulders, forward head, and posterior bias   LOWER EXTREMITY ROM:  Active  Right Eval Left Eval  Hip flexion  Limited to 5 deg beyond 90 in sitting  Knee flexion  90deg in sitting  Knee extension  Lacking 10 deg full extension  Ankle dorsiflexion  none  Ankle plantarflexion  none   (Blank rows = not tested)  LOWER EXTREMITY MMT:    MMT Right Eval Left Eval  Hip flexion  2+  Hip extension    Hip abduction  2  Hip adduction  3  Hip internal rotation    Hip external rotation    Knee flexion  2+  Knee extension  3+  Ankle dorsiflexion  0  Ankle plantarflexion  0  Ankle inversion    Ankle eversion    (Blank rows = not tested)  BED MOBILITY:  Sit to supine Mod A Supine to sit Mod A Rolling to Right Min A Rolling to Left Min A  TRANSFERS: Assistive device utilized: Hemi walker  Sit to stand: Mod A Stand to sit: Mod A Chair to chair: Mod A Floor:  unable to perform     CURB:  Level of Assistance: Total A Assistive device utilized:  rail Curb Comments: unable to perform   STAIRS: Unable to perform at eval   GAIT: Gait pattern:  non-functional constant posterior bias , step to pattern, decreased stance time- Right, Right hip hike, lateral hip instability, and lateral lean- Left Distance walked: 53ft Assistive device utilized: Hemi walker Level of assistance: Mod A Comments: moderate cues for sequencing of HW and gait pattern in turns   FUNCTIONAL TESTS:  5 times sit to stand: 48 sec with mod Assist from PT  Timed up and go (TUG): unable to perform  10 meter walk test: 6 ft with min-mod assist and HW.  FIST:  Function In Sitting Test (FIST)  (1/2 femur on surface; hips/knees flexed to 90deg)   - indicate bed or mat table / step stool if used  SCORING KEY: 4 =  Independent (completes task independently & successfully) 3 = Verbal Cues/Increased Time (completes task independently & successfully and only needs more time/cues) 2 = Upper Extremity Support (must use UE for support or assistance to complete successfully) 1 = Needs Assistance (unable to complete w/o physical assist; DOCUMENT LEVEL: min, mod, max) 0 = Dependent (requires complete physical assist; unable to complete successfully even w/ physical assist)  Randomly Administer Once Throughout Exam  4 - Anterior Nudge (superior sternum)  4 - Posterior Nudge (between scapular spines)  4 - Lateral Nudge (to dominant side at acromion)     4 - Static sitting (30 seconds)  4 - Sitting, shake 'no' (left and right)  4 - Sitting, eyes closed (30 seconds)   3 - Sitting, lift foot (dominant side, lift foot 1 inch twice)    4 - Pick up object from behind (object at midline, hands breadth posterior)  1 mod assist - Forward reach (use dominant arm, must complete full motion) 1 mod assist - Lateral reach (use dominant arm, clear opposite ischial tuberosity) 1 mod assist  - Pick up object from floor (from between feet)   1 mod assist - Posterior scooting (move backwards 2 inches)  1 min assist  - Anterior scooting (move forward 2 inches)  1 min assist  - Lateral scooting (move to dominant side 2 inches)    TOTAL = 37/56  Notes/comments: apraxia limiting success of scooting and reacting tasks.  MCD > 5 points MCID for IP REHAB > 6 points  PATIENT SURVEYS:  To be completed                                                                                                                               TREATMENT DATE: 11/28/2023  Stand pivot transfer to nustep; mod assist to prevent posterior bias; UE supported on HW BLE reciprocal movement training 2 min x 3 with 1 min rest breaks between bouts.   Stand pivot transfer back to [facility] with min assist and HW. Improved posture with return to W.     HEP education at rail:  - Sit to Stand with Counter Support  - 2 x d5 reps - cues to push from arm rest  - Seated Long Arc Quad -  10 reps - 2 hold - Seated March  - 10 reps - 2 hold - Standing Hip Abduction with Counter Support  - 2 sets - 5 reps min assist for increased ROM on the RLE - Wide Stance with UE supported on wall   - 3 reps - 30sec  hold  Sit<>stand throughout session with CGA/min assist and moderate cues for UE placement on arm rest.      PATIENT EDUCATION: Education details: POC.  HEP.  Person educated: Patient and Child(ren) Education method: Explanation Education comprehension: verbalized understanding  HOME EXERCISE PROGRAM: Access Code: [email or web address] URL: [email or web address] Date: 11/28/2023 Prepared by: Grier Rocher  Exercises - Sit to Stand with Counter Support  - 1 x daily - 3-4 x weekly - 2 sets - 10 reps - Seated Long Arc Quad  - 1 x daily - 3-4 x weekly - 2 sets - 10 reps - 2 hold - Seated March  - 1 x daily - 3-4 x weekly - 2 sets - 10 reps - 2 hold - Standing Hip Abduction with Counter Support  - 1 x daily - 7 x weekly - 3 sets - 5 reps - Wide Stance with Counter Support  - 1 x daily - 7 x weekly - 3 sets - 2 reps - 30 hold  GOALS: Goals reviewed with patient? Yes  SHORT TERM GOALS: Target date: 12/13/2023    Patient will be independent in home exercise program to improve strength/mobility for better functional independence with ADLs. Baseline: to be provided on visit 2  Goal status: INITIAL   LONG TERM GOALS: Target date: 02/07/2024     2.  Patient (> 27 years old) will complete five times sit to stand test by 15 seconds and only min assist indicating an increased LE strength and improved balance. Baseline: 48 with mod assist from PT  Goal status: INITIAL  3.  Patient will increase FIST score by > 6 points to demonstrate decreased fall risk during functional activities Baseline: 37 Goal status: INITIAL  4.  Patient will  complete 10 meter walk test to with HW in less than 1 minute with min assist as to improve gait speed for better community ambulation  and to reduce fall risk. Baseline: 6 ft with HW and mod assist  Goal status: INITIAL  5.  Patient will perform stand pivot transfer to and from [facility] with HW and CGA for safety to allow safe transfer to toilet with daughter without use of rail on the wall. Baseline: mod assist and heavy use of rail.  Goal status: INITIAL  6.  Patient will improve bed mobility to min assist only to reduce care giver burden and improve safety of transfers  Baseline: mod-max assist Goal status: INITIAL  ASSESSMENT:  CLINICAL IMPRESSION: Patient arrived motivated to participate. No pain reported.  PT proivded HEP for daughter to perform at home. Emphasis on improved ROM of hip/knee flexion as well as WB into forefoot to prevent posterior bias. Daughter endorses that she will continue HEP daily when not in PT. Pt will benefit from skilled PT to address strength and balance deficits to improve safety for transfers, reduce care giver burden, and allow return to PLOF and possible return home from Assisted living.   OBJECTIVE IMPAIRMENTS: Abnormal gait, cardiopulmonary status limiting activity, decreased activity tolerance, decreased balance, decreased cognition, decreased coordination, decreased endurance, decreased knowledge of use of DME, decreased mobility, difficulty walking, decreased ROM, decreased strength, hypomobility, increased muscle spasms, impaired flexibility, impaired sensation, impaired tone, impaired UE functional use, improper body mechanics, and postural dysfunction.   ACTIVITY LIMITATIONS: carrying, lifting, bending, standing, squatting, stairs, transfers, bed mobility, continence, bathing, toileting, dressing, reach over head, hygiene/grooming, and locomotion level  PARTICIPATION LIMITATIONS: cleaning, interpersonal relationship, shopping, and community  activity  PERSONAL FACTORS: 3+ comorbidities: HTN, chronic CVA, Afib, Lupus   are also affecting patient's functional outcome.   REHAB POTENTIAL: Fair chronic CVA with poor progress in prior interventions with this clinic  CLINICAL DECISION MAKING: Unstable/unpredictable  EVALUATION COMPLEXITY: High  PLAN:  PT FREQUENCY: 1-2x/week  PT DURATION: 12 weeks  PLANNED INTERVENTIONS: 97110-Therapeutic exercises, 97530- Therapeutic activity, 97112- Neuromuscular re-education, [phone]- Self Care, 56213- Manual therapy, [phone]- Gait training, [phone]- Orthotic Fit/training, [phone]- Aquatic Therapy, [phone]- Splinting, [id number]- Electrical stimulation (unattended), [phone]- Electrical stimulation (manual), Patient/Family education, Balance training, Stair training, Joint mobilization, Vestibular training, Visual/preceptual remediation/compensation, Cognitive remediation, DME instructions, Wheelchair mobility training, Cryotherapy, and Moist heat  PLAN FOR NEXT SESSION:  Provide  alternative  supine HEP for safety   Golden Pop, PT 11/28/2023, 10:28 AM

## 2023-11-28 NOTE — Therapy (Signed)
 OUTPATIENT OCCUPATIONAL THERAPY NEURO TREATMENT NOTE  Patient Name: Lindsey [name] MRN: [id number] DOB:1956/04/16, 68 y.o., female Today's Date: 11/28/2023  PCP: Emogene Morgan, MD REFERRING PROVIDER: Sula Soda, MD  END OF SESSION:  OT End of Session - 11/28/23 1255     Visit Number 3    Number of Visits 24    Date for OT Re-Evaluation 02/06/24    OT Start Time 1100    OT Stop Time 1145    OT Time Calculation (min) 45 min    Activity Tolerance Patient tolerated treatment well    Behavior During Therapy WFL for tasks assessed/performed            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) 09/24/2021   Cerebral embolism with cerebral infarction 09/23/2021   Pyelonephritis 09/19/2021   Pain due to onychomycosis of toenails of both feet 11/19/2020   Normocytic anemia 05/31/2020   Acute ischemic left MCA stroke (HCC) 04/30/2020   Right hemiplegia (HCC) 04/30/2020   Cerebrovascular accident (HCC) 04/21/2020   Atrial fibrillation with RVR (HCC) 04/21/2020   Essential hypertension 04/21/2020   Alcohol abuse 04/21/2020   ONSET DATE: 09/19/21  REFERRING DIAG: CVA  THERAPY DIAG:  Muscle weakness (generalized)  Other lack  of coordination  Acute ischemic left MCA stroke (HCC)  Rationale for Evaluation and Treatment: Rehabilitation  SUBJECTIVE:   SUBJECTIVE STATEMENT: Daughter reports that she'd like pt to have the flexibility to be able to transfer on/off a toilet or BSC from the right side or the left side. Pt accompanied by: self, daughter  PERTINENT HISTORY: Pt. is a 68 y.o. 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 Pt. has Right sided weakness, and receptive, and expressive aphasia. Pt. has had a previous CVA with right sided weakness in July 2021. PMHx includes: AFib, Renal Artery thrombosis, situational depression, HTN, Pyelnephritis, Seizure activity, COnstipation, AKI. Vitamin D deficiency, and urinary incontinence   PRECAUTIONS: None  WEIGHT BEARING RESTRICTIONS: No  PAIN:  Are you having pain? 2/10 faces scale, LEs  FALLS: Has patient fallen in last 6 months? Yes. Number of falls 1-ER visit  LIVING ENVIRONMENT: Pt. resides at [facility]. Pt. has very supportive family Stairs: One level Has following equipment at home:  Equipment provided through [employer]  PLOF: Needs assistance with ADLs  PATIENT GOALS:  To improve ROM/flexibility in the RUE 2/2 increased tightness, and to improve ADL functioning in order to towards returning home.  OBJECTIVE:  Note: Objective measures were completed at Evaluation unless otherwise noted.  HAND DOMINANCE: Right  ADLs: Overall ADLs: Assist is provided from the staff at [facility]. Transfers/ambulation related to ADLs: Eating: independent, with set-up using the left hand Grooming: Independent with set-up using the left hand UB Dressing: Increased assist required, Mod-MaxA doffing jacket LB Dressing: Increased assist required Toileting:  Staff Assists with toileting transfers, and toilet hygiene care. Bathing: Staff assists with bathing Tub Shower transfers:  Requires staff assist Equipment: TBD  IADLs: Shopping: N/A Light housekeeping: Provided by staff Meal Prep: 3 Meals a day provided. Pt. Eats in the main dining room when family visits, otherwise Pt. Eats in her room.  Community mobility: Relies on family for transportation to therapy. Per daughter- Transportation is provided for services not offered at the facility. Medication management: Provided by the facility Financial management: Daughter manages Handwriting: TBD  MOBILITY STATUS: Hx of falls  FUNCTIONAL OUTCOME MEASURES:   UPPER EXTREMITY ROM:    Passive ROM Right Eval  Left Eval AROM WFL  Shoulder flexion 0(46)   Shoulder abduction 0(64)   Shoulder adduction    Shoulder extension    Shoulder internal rotation    Shoulder external rotation    Elbow flexion 0(98)   Elbow extension 0(full passive)   Wrist flexion 0(56)   Wrist extension -56(30)   Wrist ulnar deviation    Wrist radial deviation    Wrist pronation    Wrist supination    (Blank rows = not tested)  UPPER EXTREMITY MMT:     MMT Right eval Left Eval 5/5  Shoulder flexion 0/5   Shoulder abduction 0/5   Shoulder adduction    Shoulder extension    Shoulder internal rotation    Shoulder external rotation    Middle trapezius    Lower trapezius    Elbow flexion 0/5   Elbow extension 0/5   Wrist flexion    Wrist extension 0/5   Wrist ulnar deviation    Wrist radial deviation    Wrist pronation 0/5   Wrist supination 0/5   (Blank rows = not tested)  HAND FUNCTION: N/A  COORDINATION: N/A  SENSATION: Diminished  EDEMA: No edema noted in the right hand today  MUSCLE TONE: impaired  COGNITION: Overall cognitive status: Aphasia  VISION: Wears glasses, no change from baseline   PERCEPTION: TBD  PRAXIS: Impaired: Motor planning                                                                                                                           TREATMENT DATE:  Moist heat  modality to the right shoulder prior to Manual therapy, and simultaneous to therapeutic ex.Pilar Jarvis.   Manual therapy: -Soft tissue massage to the scapular, and right shoulder musculature, promoting R shoulder depression and retraction -Soft tissue mobilizations to promote radius on ulna motion needed for supination.  -Manual therapy was performed independent of, and in preparation for therapeutic Ex.    Therapeutic Exercise: -PROM through all joint ranges of the RUE for shoulder flexion, abduction, horizontal abduction, elbow flexion, extension, forearm supination, wrist extension, digit extension, and  thumb abduction.  -Review of self-PROM with hand-over-hand assist  Self Care: -Wc<>toilet transfer using grab bar on wall x2; mod A.  Vc for hand and foot placement; tactile and vc for LLE sequencing -Reviewed home layout and [employer] for bathroom set up: Grab bar on pt's wall in daughter's home would be on the L (bar in clinic on the R, with pt transferring toward weaker side from wc to toilet).  Daughter also reports no room for wc in bathroom at home.  Pt step pivots from bathroom door and bathroom door is a few steps from pt's recliner.  PATIENT EDUCATION: Education details: Review of self PROM to the RUE Person educated: Patient Education method: Explanation, Demonstration, Tactile cues, and Verbal cues Education comprehension: verbalized understanding, returned demonstration, and verbal cues required  HOME EXERCISE PROGRAM: RUE self PROM  GOALS: Goals reviewed with patient? Yes  SHORT TERM GOALS: Target date: 12/27/2023    Pt. Will demonstrate supervision with HEPs for BUEs Baseline: Eval: No current HEPs  Goal status: INITIAL  LONG TERM GOALS: Target date: 02/07/2024    1.  Pt. Will improve right shoulder flexion by 20 degrees to assist with UE dressing Baseline: Eval: Right: 0(46) Goal status: INITIAL  2.  Pt. Will improve with shoulder abduction by 20 degrees to  assist with underarm care, and hygiene Baseline: Eval: Right 0(64) Goal status: INITIAL  3.  Pt. Will improve right elbow flexion by 10 degrees to assist with hand to face patterns for gromming Baseline: Eval: Right 0(98) Goal status: INITIAL  4.  Pt. Will elicit active wrist extension, and digit extension in preparation for rubbing lotion into the let forearm, and hand  Baseline:  Goal status: INITIAL  5.  Pt. Will perform UE dressing skills with MinA Baseline: Eval: Mod-MaxA doffing a jacket Goal status: INITIAL  6. Pt. Will require minA toileting transfers/ clothing management  Baseline: Eval: Pt. Requires increased assist for staff  Goal status: INITIAL   ASSESSMENT: CLINICAL IMPRESSION: Pt. tolerated moist heat, manual therapy, and ROM well today.  Pt able to return demo of table slides, digit and wrist flex/ext, and elbow flex/ext with hand over hand assist for form/technique.  No reports of pain this date, only slight discomfort near end range shoulder abd, though well below 90*.  Able to transfer with mod A wc<>toilet using LUE on wall grab bar for support.  Pt required sequencing cues for transitioning LLE during pivot steps, and assist to position RLE in prep to ascend from commode.  Daughter hopeful to work towards pt being able to transfer safely both toward the weaker side and stronger side.   Pt continues to present with increased flexor tone, and tightness proximally through the scapular, and shoulder musculature, as well as through the wrist, and digits. Pt. continues to benefit from OT services to normalize tone in the dominant RUE, improve ROM for underarm hygiene care and UE dressing, as well as to improve toileting care clothing negotiation tasks.  PERFORMANCE DEFICITS: in functional skills including ADLs, IADLs, coordination, dexterity, proprioception, sensation, ROM, strength, pain, Fine motor control, Gross motor control, balance, body mechanics, decreased knowledge of  use of DME, and UE functional use, cognitive skills including problem solving, and psychosocial skills including coping strategies, environmental adaptation, and routines and behaviors.   IMPAIRMENTS: are limiting patient from ADLs, IADLs, and leisure.   CO-MORBIDITIES: may have co-morbidities  that affects occupational performance. Patient will benefit from skilled OT to address above impairments and  improve overall function.  MODIFICATION OR ASSISTANCE TO COMPLETE EVALUATION: Min-Moderate modification of tasks or assist with assess necessary to complete an evaluation.  OT OCCUPATIONAL PROFILE AND HISTORY: Detailed assessment: Review of records and additional review of physical, cognitive, psychosocial history related to current functional performance.  CLINICAL DECISION MAKING: Moderate - several treatment options, min-mod task modification necessary  REHAB POTENTIAL: Good  EVALUATION COMPLEXITY: Moderate    PLAN:  OT FREQUENCY: 2x/week  OT DURATION: 12 weeks  PLANNED INTERVENTIONS: 97168 OT Re-evaluation, 97535 self care/ADL training, 84696 therapeutic exercise, 97530 therapeutic activity, 97112 neuromuscular re-education, 97140 manual therapy, 97018 paraffin, 29528 moist heat, 97010 cryotherapy, 97034 contrast bath, passive range of motion, coping strategies training, patient/family education, and DME and/or AE instructions  RECOMMENDED OTHER SERVICES: PT, ST  CONSULTED AND AGREED WITH PLAN OF CARE: Patient  PLAN FOR NEXT SESSION: see above  Danelle Earthly, MS, OTR/L  11/28/2023, 1:12 PM

## 2023-11-30 ENCOUNTER — Ambulatory Visit: Payer: Medicare Other | Admitting: Occupational Therapy

## 2023-11-30 ENCOUNTER — Ambulatory Visit: Payer: Medicare Other | Admitting: Speech Pathology

## 2023-11-30 ENCOUNTER — Ambulatory Visit: Payer: Medicare Other | Admitting: Physical Therapy

## 2023-11-30 DIAGNOSIS — R262 Difficulty in walking, not elsewhere classified: Secondary | ICD-10-CM

## 2023-11-30 DIAGNOSIS — R269 Unspecified abnormalities of gait and mobility: Secondary | ICD-10-CM

## 2023-11-30 DIAGNOSIS — M6281 Muscle weakness (generalized): Secondary | ICD-10-CM | POA: Diagnosis not present

## 2023-11-30 DIAGNOSIS — R2681 Unsteadiness on feet: Secondary | ICD-10-CM

## 2023-11-30 DIAGNOSIS — R278 Other lack of coordination: Secondary | ICD-10-CM

## 2023-11-30 NOTE — Therapy (Signed)
 OUTPATIENT OCCUPATIONAL THERAPY NEURO TREATMENT NOTE  Patient Name: Lindsey [name] MRN: [id number] DOB:1956/02/29, 68 y.o., female Today's Date: 11/30/2023  PCP: Emogene Morgan, MD REFERRING PROVIDER: Sula Soda, MD  END OF SESSION:  OT End of Session - 11/30/23 1147     Visit Number 4    Number of Visits 24    Date for OT Re-Evaluation 02/06/24    OT Start Time 0930    OT Stop Time 1015    OT Time Calculation (min) 45 min    Activity Tolerance Patient tolerated treatment well    Behavior During Therapy WFL for tasks assessed/performed            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) 09/24/2021   Cerebral embolism with cerebral infarction 09/23/2021   Pyelonephritis 09/19/2021   Pain due to onychomycosis of toenails of both feet 11/19/2020   Normocytic anemia 05/31/2020   Acute ischemic left MCA stroke (HCC) 04/30/2020   Right hemiplegia (HCC) 04/30/2020   Cerebrovascular accident (HCC) 04/21/2020   Atrial fibrillation with RVR (HCC) 04/21/2020   Essential hypertension 04/21/2020   Alcohol abuse 04/21/2020   ONSET DATE: 09/19/21  REFERRING DIAG: CVA  THERAPY DIAG:  Muscle weakness (generalized)  Rationale  for Evaluation and Treatment: Rehabilitation  SUBJECTIVE:   SUBJECTIVE STATEMENT: Daughter reports that she'd like pt to have the flexibility to be able to transfer on/off a toilet or BSC from the right side or the left side. Pt accompanied by: self, daughter  PERTINENT HISTORY: Pt. is a 68 y.o. 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 Pt. has Right sided weakness, and receptive, and expressive aphasia. Pt. has had a previous CVA with right sided weakness in July 2021. PMHx includes: AFib, Renal Artery thrombosis, situational depression, HTN, Pyelnephritis, Seizure activity, COnstipation, AKI. Vitamin D deficiency, and urinary incontinence   PRECAUTIONS: None  WEIGHT BEARING RESTRICTIONS: No  PAIN:  Are you having pain? 2/10 faces scale, LEs  FALLS: Has patient fallen in last 6 months? Yes. Number of falls 1-ER visit  LIVING ENVIRONMENT: Pt. resides at [facility]. Pt. has very supportive family Stairs: One level Has following equipment at home:  Equipment provided through [employer]  PLOF: Needs assistance with ADLs  PATIENT GOALS:  To improve ROM/flexibility in the RUE 2/2 increased tightness, and to improve ADL functioning in order to towards returning home.  OBJECTIVE:  Note: Objective measures were completed at Evaluation unless otherwise noted.  HAND DOMINANCE: Right  ADLs: Overall ADLs: Assist is provided from the staff at [facility]. Transfers/ambulation related to ADLs: Eating: independent, with set-up using the left  hand Grooming: Independent with set-up using the left hand UB Dressing: Increased assist required, Mod-MaxA doffing jacket LB Dressing: Increased assist required Toileting:  Staff Assists with toileting transfers, and toilet hygiene care. Bathing: Staff assists with bathing Tub Shower transfers: Requires staff assist Equipment: TBD  IADLs: Shopping: N/A Light  housekeeping: Provided by staff Meal Prep: 3 Meals a day provided. Pt. Eats in the main dining room when family visits, otherwise Pt. Eats in her room.  Community mobility: Relies on family for transportation to therapy. Per daughter- Transportation is provided for services not offered at the facility. Medication management: Provided by the facility Financial management: Daughter manages Handwriting: TBD  MOBILITY STATUS: Hx of falls  FUNCTIONAL OUTCOME MEASURES:   UPPER EXTREMITY ROM:    Passive ROM Right Eval  Left Eval AROM WFL  Shoulder flexion 0(46)   Shoulder abduction 0(64)   Shoulder adduction    Shoulder extension    Shoulder internal rotation    Shoulder external rotation    Elbow flexion 0(98)   Elbow extension 0(full passive)   Wrist flexion 0(56)   Wrist extension -56(30)   Wrist ulnar deviation    Wrist radial deviation    Wrist pronation    Wrist supination    (Blank rows = not tested)  UPPER EXTREMITY MMT:     MMT Right eval Left Eval 5/5  Shoulder flexion 0/5   Shoulder abduction 0/5   Shoulder adduction    Shoulder extension    Shoulder internal rotation    Shoulder external rotation    Middle trapezius    Lower trapezius    Elbow flexion 0/5   Elbow extension 0/5   Wrist flexion    Wrist extension 0/5   Wrist ulnar deviation    Wrist radial deviation    Wrist pronation 0/5   Wrist supination 0/5   (Blank rows = not tested)  HAND FUNCTION: N/A  COORDINATION: N/A  SENSATION: Diminished  EDEMA: No edema noted in the right hand today  MUSCLE TONE: impaired  COGNITION: Overall cognitive status: Aphasia  VISION: Wears glasses, no change from baseline   PERCEPTION: TBD  PRAXIS: Impaired: Motor planning                                                                                                                           TREATMENT DATE:  Moist heat modality to the right shoulder, elbow, forearm, and wrist prior to Manual  therapy, and there. Ex./ROM.    Manual therapy:   -Soft tissue massage to the scapular, and right shoulder musculature. -Soft tissue mobilizations to promote radius on ulna motion needed for supination.  -Manual therapy was performed independent of, and in preparation for therapeutic Ex.     There. Ex:   -PROM through all joint ranges of the RUE for shoulder flexion, abduction, horizontal abduction, elbow flexion, extension, forearm supination, wrist extension, digit extension, and thumb abduction.  -Education was provided about self-PROM with hand-over-hand  assist  Self-care:  - Mod-MaxA doffing a light lined denim style jacket - mod-max A donning the jacket with step-by-step cues  PATIENT EDUCATION: Education details: Review of self PROM to the RUE Person educated: Patient Education method: Explanation, Demonstration, Tactile cues, and Verbal cues Education comprehension: verbalized understanding, returned demonstration, and verbal cues required  HOME EXERCISE PROGRAM: RUE self PROM  GOALS: Goals reviewed with patient? Yes  SHORT TERM GOALS: Target date: 12/27/2023    Pt. Will demonstrate supervision with HEPs for BUEs Baseline: Eval: No current HEPs  Goal status: INITIAL  LONG TERM GOALS: Target date: 02/07/2024    1.  Pt. Will improve right shoulder flexion by 20 degrees to assist with UE dressing Baseline: Eval: Right: 0(46) Goal status: INITIAL  2.  Pt. Will improve with shoulder abduction by 20 degrees to assist with underarm care, and hygiene Baseline: Eval: Right 0(64) Goal status: INITIAL  3.  Pt. Will improve right elbow flexion by 10 degrees to assist with hand to face patterns for gromming Baseline: Eval: Right 0(98) Goal status: INITIAL  4.  Pt. Will elicit active wrist extension, and digit extension in preparation for rubbing lotion into the let forearm, and hand  Baseline:  Goal status: INITIAL  5.  Pt. Will perform UE dressing skills with  MinA Baseline: Eval: Mod-MaxA doffing a jacket Goal status: INITIAL  6. Pt. Will require minA toileting transfers/ clothing management  Baseline: Eval: Pt. Requires increased assist for staff  Goal status: INITIAL   ASSESSMENT:  CLINICAL IMPRESSION:  Pt. tolerated moist heat, manual therapy, and ROM well today. Pt. requires step-by-step cues, and assist to donn, and doff a jacket.  Pt continues to present with increased flexor tone, and tightness proximally through the scapular, and shoulder musculature, as well as through the wrist, and digits. Pt. continues to benefit from OT services to normalize tone in the dominant RUE, improve ROM for underarm hygiene care and UE dressing, as well as to improve toileting care, and clothing negotiation tasks.  PERFORMANCE DEFICITS: in functional skills including ADLs, IADLs, coordination, dexterity, proprioception, sensation, ROM, strength, pain, Fine motor control, Gross motor control, balance, body mechanics, decreased knowledge of use of DME, and UE functional use, cognitive skills including problem solving, and psychosocial skills including coping strategies, environmental adaptation, and routines and behaviors.   IMPAIRMENTS: are limiting patient from ADLs, IADLs, and leisure.   CO-MORBIDITIES: may have co-morbidities  that affects occupational performance. Patient will benefit from skilled OT to address above impairments and improve overall function.  MODIFICATION OR ASSISTANCE TO COMPLETE EVALUATION: Min-Moderate modification of tasks or assist with assess necessary to complete an evaluation.  OT OCCUPATIONAL PROFILE AND HISTORY: Detailed assessment: Review of records and additional review of physical, cognitive, psychosocial history related to current functional performance.  CLINICAL DECISION MAKING: Moderate - several treatment options, min-mod task modification necessary  REHAB POTENTIAL: Good  EVALUATION COMPLEXITY:  Moderate    PLAN:  OT FREQUENCY: 2x/week  OT DURATION: 12 weeks  PLANNED INTERVENTIONS: 97168 OT Re-evaluation, 97535 self care/ADL training, 40981 therapeutic exercise, 97530 therapeutic activity, 97112 neuromuscular re-education, 97140 manual therapy, 97018 paraffin, 19147 moist heat, 97010 cryotherapy, 97034 contrast bath, passive range of motion, coping strategies training, patient/family education, and DME and/or AE instructions  RECOMMENDED OTHER SERVICES: PT, ST  CONSULTED AND AGREED WITH PLAN OF CARE: Patient  PLAN FOR NEXT SESSION: see above  Olegario Messier, MS, OTR/L   11/30/2023, 11:52 AM

## 2023-11-30 NOTE — Therapy (Signed)
 OUTPATIENT PHYSICAL THERAPY NEURO TREATMENT   Patient Name: Lindsey [name] MRN: [id number] DOB:[birth year], 68 y.o., female Today's Date: 11/30/2023   PCP: Lindsey Morgan, MD  REFERRING PROVIDER: Horton Chin, MD   END OF SESSION:  PT End of Session - 11/30/23 1055     Visit Number 5    Number of Visits 24    Date for PT Re-Evaluation 02/06/24    PT Start Time 1017    PT Stop Time 1027    PT Time Calculation (min) 10 min    Equipment Utilized During Treatment Gait belt    Activity Tolerance Patient tolerated treatment well    Behavior During Therapy WFL for tasks assessed/performed             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   REFERRING DIAG:   Diagnosis  I69.398,R25.2 (ICD-10-CM) - Spasticity as late effect of cerebrovascular accident (CVA)    THERAPY DIAG:  Muscle weakness (generalized)  Abnormality of gait and mobility  Other lack of coordination  Difficulty in walking, not elsewhere classified  Unsteadiness on feet  Rationale for Evaluation and Treatment: Rehabilitation  SUBJECTIVE:  SUBJECTIVE STATEMENT: TODAY: pt reports that she is good. No other updates. Pt was brought to PT through transportation, family not present at session.     From EVAL: Daughter present for Evaluation to provide hx. Pt is familiar to this clinic and has been seen for multiple bouts PT since initial CVA in 2021. Pt d/c'ed from PT serviced in April of 2024 with plans to receive PT services through [facility]. Daughter reports that she was only receiving minimal therapy in facility. Reports at least 1 fall in the last 6 months and daughter asked staff to transfer pt with lift since fall.  Daughter would want pt to demonstrate improve safety with transfers to allow bil transfers with reduced use of rail to allow pt to return home for assisted living.  Unable to discern whether pt in SNF or assisted living care from hx.    Pt accompanied by: family member Lindsey [name]   PERTINENT HISTORY:  1. Acute Ischemic Left MCA Stroke:Right Hemiplegia: Ordered outpatient OT, PT, and SLP. She has had a HFU appointment with Neurology. Discussed that I can provide new scripts next month if needed. Communicated with her providers regarding positive reinforcement. She has made excellent gains in strength as well as cognition and speech!   -Provided with new disability placard.    2. Essential Hypertension: BP is currently very elevated and discussed with patient that it would be  unsafe for her to receive injections today as the injections can temporarily increase her pain, which can increase her blood pressure, and put her at risk for stroke. Discussed starting the medication Valsartan 40mg  daily and logging blood pressures daily at home and she is agreeable. Medications reviewed and she is taking Amlodipine 5mg  and lopressor.  Provided with a list of foods that will help lower her BP.  BP reviewed and improved at 136/82 -continue clonidine -Advised checking BP daily at home and logging results to bring into follow-up appointment with PCP and myself. -Reviewed BP meds today.  -Advised regarding healthy foods that can help lower blood pressure and provided with a list:  PAIN:  Are you having pain? No  PRECAUTIONS: Fall  RED FLAGS: None   WEIGHT BEARING RESTRICTIONS: No  FALLS: Has patient fallen in last 6 months? Yes. Number of falls 1  LIVING ENVIRONMENT: Lives with: lives with their family and lives in a skilled nursing facility Lives in: Other SNF  Stairs: No Has following equipment at home: Hemi walker and Wheelchair (manual)  PLOF: Requires assistive device for independence, Needs assistance with ADLs, Needs assistance with homemaking, and WC level currently   PATIENT GOALS: move better - walking or transferring with hemi walking   OBJECTIVE:  Note: Objective measures were completed at Evaluation unless otherwise noted.  DIAGNOSTIC FINDINGS:  Head CT:  IMPRESSION: 1. No acute intracranial abnormality. 2. No acute displaced fracture or traumatic listhesis of the cervical spine.  Cervical CT:  MPRESSION: 1. No acute intracranial abnormality. 2. No acute displaced fracture or traumatic listhesis of the cervical spine.  COGNITION: Overall cognitive status: History of cognitive impairments - at baseline   SENSATION: Light touch: Impaired  Proprioception: Impaired  Able to detect depe pressure   COORDINATION: Increased tone on the RLE    EDEMA:  Mild RLE distal edema   MUSCLE TONE: RLE: Mild and Moderate  MUSCLE LENGTH: Hamstrings: grossly limited    POSTURE: rounded shoulders, forward head, and posterior bias   LOWER EXTREMITY ROM:     Active  Right  Eval Left Eval  Hip flexion  Limited to 5 deg beyond 90 in sitting  Knee flexion  90deg in sitting  Knee extension  Lacking 10 deg full extension  Ankle dorsiflexion  none  Ankle plantarflexion  none   (Blank rows = not tested)  LOWER EXTREMITY MMT:    MMT Right Eval Left Eval  Hip flexion  2+  Hip extension    Hip abduction  2  Hip adduction  3  Hip internal rotation    Hip external rotation    Knee flexion  2+  Knee extension  3+  Ankle dorsiflexion  0  Ankle plantarflexion  0  Ankle inversion    Ankle eversion    (Blank rows = not tested)  BED MOBILITY:  Sit to supine Mod A Supine to sit Mod A Rolling to Right Min A Rolling to Left Min A  TRANSFERS: Assistive device utilized: Hemi walker  Sit to stand: Mod A Stand to sit: Mod A Chair to chair: Mod A Floor:  unable to perform     CURB:  Level of Assistance: Total A Assistive device utilized:  rail Curb Comments: unable to perform   STAIRS: Unable to perform at eval   GAIT: Gait pattern:  non-functional constant posterior bias , step to pattern, decreased stance time- Right, Right hip hike, lateral hip instability, and lateral lean- Left Distance walked: 13ft Assistive device utilized: Hemi walker Level of assistance: Mod A Comments: moderate cues for sequencing of HW and gait pattern in turns   FUNCTIONAL TESTS:  5 times sit to stand: 48 sec with mod Assist from PT  Timed up and go (TUG): unable to perform  10 meter walk test: 6 ft with min-mod assist and HW.  FIST:  Function In Sitting Test (FIST)  (1/2 femur on surface; hips/knees flexed to 90deg)   - indicate bed or mat table / step stool if used  SCORING KEY: 4 = Independent (completes task independently &  successfully) 3 = Verbal Cues/Increased Time (completes task independently & successfully and only needs more time/cues) 2 = Upper Extremity Support (must use UE for support or assistance to complete successfully) 1 = Needs Assistance (unable to complete w/o physical assist; DOCUMENT LEVEL: min, mod, max) 0 = Dependent (requires complete physical assist; unable to complete successfully even w/ physical assist)  Randomly Administer Once Throughout Exam  4 - Anterior Nudge (superior sternum)  4 - Posterior Nudge (between scapular spines)  4 - Lateral Nudge (to dominant side at acromion)     4 - Static sitting (30 seconds)  4 - Sitting, shake 'no' (left and right)  4 - Sitting, eyes closed (30 seconds)   3 - Sitting, lift foot (dominant side, lift foot 1 inch twice)    4 - Pick up object from behind (object at midline, hands breadth posterior)  1 mod assist - Forward reach (use dominant arm, must complete full motion) 1 mod assist - Lateral reach (use dominant arm, clear opposite ischial tuberosity) 1 mod assist  - Pick up object from floor (from between feet)   1 mod assist - Posterior scooting (move backwards 2 inches)  1 min assist  - Anterior scooting (move forward 2 inches)  1 min assist  - Lateral scooting (move to dominant side 2 inches)    TOTAL = 37/56  Notes/comments: apraxia limiting success of scooting and reacting tasks.  MCD > 5 points MCID for IP REHAB > 6 points  PATIENT SURVEYS:  To be completed                                                                                                                               TREATMENT DATE: 11/30/2023  PT assisted pt to correct placement of ankle strap on AFO with total A. Scooting in chair performed forward/reverse x 2 each.  Sit<>stand at rail with CGA for position of the RLE. Once in standing, pt noted to have been incontinent of bladder and brief and pants with saturated with urin, and cushion was damp.   Front dest notified daughter. No additional clothing available.  Pt declined assist from PT to to change into dry brief. PT session terminated, so that Pt could be transported home and changed into dry clothing and reduce risk of skin damage.      PATIENT EDUCATION: Education details: POC.  HEP.  Person educated: Patient and Child(ren) Education method: Explanation Education comprehension: verbalized understanding  HOME EXERCISE PROGRAM: Access Code: [email or web address] URL: [email or web address] Date: 11/28/2023 Prepared by: Grier Rocher  Exercises - Sit to Stand with Counter Support  - 1 x daily - 3-4 x weekly - 2 sets - 10 reps - Seated Long Arc Quad  - 1 x daily - 3-4 x weekly - 2 sets - 10 reps - 2 hold - Seated March  - 1 x daily - 3-4 x weekly - 2 sets - 10 reps - 2 hold - Standing Hip Abduction with Counter Support  - 1 x daily - 7 x weekly - 3 sets - 5 reps - Wide Stance with Counter Support  - 1 x daily - 7 x weekly - 3 sets - 2 reps - 30 hold  GOALS: Goals reviewed with patient? Yes  SHORT TERM GOALS: Target date: 12/13/2023    Patient will be independent in home exercise program to improve strength/mobility for better functional independence with ADLs. Baseline: to be provided on visit 2  Goal status: INITIAL   LONG TERM GOALS: Target date: 02/07/2024     2.  Patient (> 78 years old) will complete five times sit to stand test by 15 seconds and only min assist indicating an increased LE strength and improved balance. Baseline: 48 with mod assist from PT  Goal status: INITIAL  3.  Patient will increase FIST score by > 6 points to demonstrate decreased fall risk during functional activities Baseline: 37 Goal status: INITIAL  4.  Patient will complete 10 meter walk test to with HW in less than 1 minute with min assist as to improve gait speed for better community ambulation and to reduce fall risk. Baseline: 6 ft with HW and mod assist  Goal status:  INITIAL  5.  Patient will perform stand pivot transfer to and from [facility] with HW and CGA for safety to allow safe transfer to toilet with daughter without use of rail on the wall. Baseline: mod assist and heavy use  of rail.  Goal status: INITIAL  6.  Patient will improve bed mobility to min assist only to reduce care giver burden and improve safety of transfers  Baseline: mod-max assist Goal status: INITIAL  ASSESSMENT:  CLINICAL IMPRESSION: Patient arrived motivated to participate, but found to have been incontinent of bladder with pants and brief saturated. Pt declined assist from PT to change into dry brief. Daughter notified and requested pt return to to facility to change into clean clothes to reduce risk of skin breakdown. Pt will benefit from skilled PT to address strength and balance deficits to improve safety for transfers, reduce care giver burden, and allow return to PLOF and possible return home from Assisted living.   OBJECTIVE IMPAIRMENTS: Abnormal gait, cardiopulmonary status limiting activity, decreased activity tolerance, decreased balance, decreased cognition, decreased coordination, decreased endurance, decreased knowledge of use of DME, decreased mobility, difficulty walking, decreased ROM, decreased strength, hypomobility, increased muscle spasms, impaired flexibility, impaired sensation, impaired tone, impaired UE functional use, improper body mechanics, and postural dysfunction.   ACTIVITY LIMITATIONS: carrying, lifting, bending, standing, squatting, stairs, transfers, bed mobility, continence, bathing, toileting, dressing, reach over head, hygiene/grooming, and locomotion level  PARTICIPATION LIMITATIONS: cleaning, interpersonal relationship, shopping, and community activity  PERSONAL FACTORS: 3+ comorbidities: HTN, chronic CVA, Afib, Lupus   are also affecting patient's functional outcome.   REHAB POTENTIAL: Fair chronic CVA with poor progress in prior interventions with  this clinic  CLINICAL DECISION MAKING: Unstable/unpredictable  EVALUATION COMPLEXITY: High  PLAN:  PT FREQUENCY: 1-2x/week  PT DURATION: 12 weeks  PLANNED INTERVENTIONS: 97110-Therapeutic exercises, 97530- Therapeutic activity, 97112- Neuromuscular re-education, [phone]- Self Care, 82956- Manual therapy, [phone]- Gait training, [phone]- Orthotic Fit/training, [phone]- Aquatic Therapy, [phone]- Splinting, 97014- Electrical stimulation (unattended), [phone]- Electrical stimulation (manual), Patient/Family education, Balance training, Stair training, Joint mobilization, Vestibular training, Visual/preceptual remediation/compensation, Cognitive remediation, DME instructions, Wheelchair mobility training, Cryotherapy, and Moist heat  PLAN FOR NEXT SESSION:   Provide  alternative  supine HEP for safety   Golden Pop, PT 11/30/2023, 10:57 AM

## 2023-12-05 ENCOUNTER — Ambulatory Visit: Payer: Medicare Other | Admitting: Physical Therapy

## 2023-12-05 ENCOUNTER — Ambulatory Visit: Payer: Medicare Other

## 2023-12-05 ENCOUNTER — Ambulatory Visit: Payer: Medicare Other | Attending: Physical Medicine and Rehabilitation | Admitting: Speech Pathology

## 2023-12-05 DIAGNOSIS — R4701 Aphasia: Secondary | ICD-10-CM | POA: Diagnosis present

## 2023-12-05 DIAGNOSIS — R2681 Unsteadiness on feet: Secondary | ICD-10-CM | POA: Diagnosis present

## 2023-12-05 DIAGNOSIS — R482 Apraxia: Secondary | ICD-10-CM

## 2023-12-05 DIAGNOSIS — M6281 Muscle weakness (generalized): Secondary | ICD-10-CM | POA: Insufficient documentation

## 2023-12-05 DIAGNOSIS — R41841 Cognitive communication deficit: Secondary | ICD-10-CM | POA: Diagnosis present

## 2023-12-05 DIAGNOSIS — I63512 Cerebral infarction due to unspecified occlusion or stenosis of left middle cerebral artery: Secondary | ICD-10-CM

## 2023-12-05 DIAGNOSIS — R262 Difficulty in walking, not elsewhere classified: Secondary | ICD-10-CM | POA: Insufficient documentation

## 2023-12-05 DIAGNOSIS — R269 Unspecified abnormalities of gait and mobility: Secondary | ICD-10-CM | POA: Diagnosis present

## 2023-12-05 DIAGNOSIS — R278 Other lack of coordination: Secondary | ICD-10-CM | POA: Insufficient documentation

## 2023-12-05 NOTE — Therapy (Unsigned)
 OUTPATIENT SPEECH LANGUAGE PATHOLOGY  TREATMENT NOTE   Patient Name: Lindsey [name] MRN: [id number] DOB:1955-12-01, 68 y.o., female Today's Date: 12/05/2023  PCP: Karie Fetch, MD REFERRING PROVIDER: Sula Soda, MD   End of Session - 12/05/23 0926     Visit Number 3    Number of Visits 17    Date for SLP Re-Evaluation 01/11/24    Authorization Type Medicare Part A/ Medicare Part B    Progress Note Due on Visit 10    SLP Start Time 0845    SLP Stop Time  0930    SLP Time Calculation (min) 45 min    Activity Tolerance Patient tolerated treatment well             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) 09/24/2021   Cerebral embolism with cerebral infarction 09/23/2021   Pyelonephritis 09/19/2021   Pain due to onychomycosis of toenails of both feet 11/19/2020   Normocytic anemia 05/31/2020   Acute ischemic left MCA stroke (HCC) 04/30/2020   Right hemiplegia (HCC) 04/30/2020   Cerebrovascular accident (HCC) 04/21/2020   Atrial fibrillation with RVR (HCC) 04/21/2020   Essential hypertension 04/21/2020   Alcohol abuse 04/21/2020    ONSET DATE: 04/30/2020 initial CVA; 10/25/2020  second CVA;  11/07/2023 date of  referral  REFERRING DIAG: R47.01 (ICD-10-CM) - Aphasia   THERAPY DIAG:  Aphasia  Apraxia  Cognitive communication deficit  Rationale for Evaluation and Treatment Rehabilitation  SUBJECTIVE:   PERTINENT HISTORY and DIAGNOSTIC FINDINGS:   Patient is a 68 y.o. female with the following prior history:  MRI 04/21/2020 Brain: Acute infarct left MCA territory. Infarct involves the left frontal lobe, insula as well as the caudate and putamen. There are areas of hemorrhage within the caudate and putamen as well as in the left frontal lobe.  Scattered small white matter hyperintensities elsewhere compatible with chronic microvascular ischemia. Ventricle size normal. Negative for mass lesion.  Inpatient Rehabilitation ST Services: 04/2020 thru 05/2020 Outpatient ST services 08/2020 thru 08/2021 Pt discharged with Lingraphica Device; per chart using device to assist with communication  MRI 09/22/2021 IMPRESSION: 1. Acute moderate size Left AICA cerebellar infarct. Petechial hemorrhage but no malignant hemorrhagic transformation. Cytotoxic edema but no posterior fossa mass effect at this time.  2. Underlying extensive chronic left MCA territory encephalomalacia related to the 2021 infarct. Associated left brainstem Wallerian degeneration since last year. And small superimposed chronic Left PICA cerebellar infarcts, but also new since last year.   Inpatient Rehabilitation ST Services at [facility]: 10/02/2021 thru 10/21/2021 Outpatient ST Services [facility] 10/2021 thru 03/08/2022 Pt discharged d/t reaching maximal rehabilitation potential; "3/4 LTGs particially met due to limited caregiver availability"  Pt  is currently residing at a SNF [facility]). Additional medical history includes alcoholism,  AFib, Renal Artery thrombosis, situational depression, HTN, Pyelnephritis, Seizure activity, Constipation, AKI. Vitamin D deficiency, and urinary incontinence.   PAIN:  Are you having  pain? No  FALLS: Has patient fallen in last 6 months?  See PT evaluation for details  LIVING ENVIRONMENT: Lives with: lives with their family and lives in a skilled nursing facility   PLOF:  Level of assistance: Needed assistance with ADLs, Needed assistance with IADLS   PATIENT GOALS    pt unable to state - her daughter reports she would like for pt to be able to "communicate basics, return to abilities before second stroke (09/2021)"  SUBJECTIVE STATEMENT: Pt arrived to session reporting that her daughter brought her, daughter didn't stay for session Pt accompanied by: family member;   OBJECTIVE:   TODAY'S TREATMENT:     Skilled treatment session focused on evaluating pt for AAC potential speech generative device as well as creation of icons.   Pt said yes/no to help create functional sentences to accompany black and white drawing to communicate the following information I need to use the bathroom Please clean my glasses I need my teeth My remote is not working To create a 2x2 grid  With moderate faded to minimal assistance pt able to select appropriate pictures given the statement after time delay pt able to select with 100% accuracy independently  Inconsistent response to yes/no questions with some self-awareness and attempts to self-correct     PATIENT REPORTED OUTCOME MEASURES (PROM): To be completed in next 3 sessions    PATIENT EDUCATION: Education details: results of this assessment, ST POC Person educated: Patient and Child(ren) Education method: Explanation Education comprehension: needs further education  HOME EXERCISE PROGRAM:   N/A    GOALS:  Goals reviewed with patient? Yes  SHORT TERM GOALS: Target date: 10 sessions  Given verbal or visual prompt, pt will correctly ID written word/object/icon in field of 4-6 to communicate wants/needs with 75% accuracy given occasional mod A over 2 sessions  Baseline: Goal status: INITIAL   LONG TERM  GOALS: Target date: 01/11/2024  Pt will utilize multimodal communication to express basic wants/needs as reported by pt and her daughter in 3 out of 5 opportunities.  Baseline:  Goal status: INITIAL   ASSESSMENT:  CLINICAL IMPRESSION: Patient is a 68 y.o. right handed female who was seen today for a speech language treatment d/t history of Left MCA CVA. 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 Her verbal language Is halting and while she does produce some islands of improved speech intelligibility during naming tasks and spontaneous responses, she speech contains neologisms with evidence of word finding difficulty. Pt's daughter reports that pt doesn't use her Lingraphica device "as much as she should" and it is only available to her during weekends at home for fear that it might be lost at pt's facility.   Pt engaged in creating some pictures to represent activities. See the above treatment note for details.   OBJECTIVE IMPAIRMENTS include attention, aphasia, apraxia, and dysarthria. These impairments are limiting patient from effectively communicating at home and in community. Factors affecting potential to achieve goals and functional outcome are ability to learn/carryover information, co-morbidities, cooperation/participation level, medical prognosis, previous level of function, severity of impairments, family/community support, and time post onset . Patient will benefit from skilled SLP services to  address above impairments and improve overall function.  REHAB POTENTIAL: Fair time post onset, multiple courses of ST services, decreased carryover of therapy concepts during previous sessions; pt discharged from previous course of ST services (2023) requiring moderate assistance for communication  PLAN: SLP FREQUENCY: 1-2x/week  SLP DURATION: 8 weeks  PLANNED INTERVENTIONS: Language  facilitation, Internal/external aids, Functional tasks, Multimodal communication approach, SLP instruction and feedback, Compensatory strategies, Patient/family education, and 13086 Treatment of speech (30 or 45 min)     Callin Ashe B. Dreama Saa, M.S., CCC-SLP, Tree surgeon Certified Brain Injury Specialist [facility]  [facility] Rehabilitation Services Office [phone] Ascom [phone] Fax [phone]

## 2023-12-05 NOTE — Therapy (Signed)
 OUTPATIENT PHYSICAL THERAPY NEURO TREATMENT   Patient Name: Lindsey [name] MRN: [id number] DOB:Oct 07, 1955, 68 y.o., female Today's Date: 12/05/2023   PCP: Emogene Morgan, MD  REFERRING PROVIDER: Horton Chin, MD   END OF SESSION:  PT End of Session - 12/05/23 0954     Visit Number 6    Number of Visits 24    Date for PT Re-Evaluation 02/06/24    PT Start Time 0930    PT Stop Time 1012    PT Time Calculation (min) 42 min    Equipment Utilized During Treatment Gait belt    Activity Tolerance Patient tolerated treatment well    Behavior During Therapy WFL for tasks assessed/performed             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   REFERRING DIAG:   Diagnosis  I69.398,R25.2 (ICD-10-CM) - Spasticity as late effect of cerebrovascular accident (CVA)    THERAPY DIAG:  Apraxia  Muscle weakness (generalized)  Abnormality of gait and mobility  Other lack of coordination  Difficulty in walking, not elsewhere classified  Unsteadiness on feet  Rationale for Evaluation and Treatment: Rehabilitation  SUBJECTIVE:  SUBJECTIVE STATEMENT: TODAY: no new updates. No family present for PT session.     From EVAL: Daughter present for Evaluation to provide hx. Pt is familiar to this clinic and has been seen for multiple bouts PT since initial CVA in 2021. Pt d/c'ed from PT serviced in April of 2024 with plans to receive PT services through [facility]. Daughter reports that she was only receiving minimal therapy in facility. Reports at least 1 fall in the last 6 months and daughter asked staff to transfer pt with lift since fall.  Daughter would want pt to demonstrate improve safety with transfers to allow bil transfers with reduced use of rail to allow pt to return home for assisted living.  Unable to discern whether pt in SNF or assisted living care from hx.    Pt accompanied by: self and family member   PERTINENT HISTORY:  1. Acute Ischemic Left MCA Stroke:Right Hemiplegia: Ordered outpatient OT, PT, and SLP. She has had a HFU appointment with Neurology. Discussed that I can provide new scripts next month if needed. Communicated with her providers regarding positive reinforcement. She has made excellent gains in strength as well as cognition and speech!   -Provided with new disability placard.    2. Essential Hypertension: BP is currently very elevated and discussed with patient that it would be unsafe for her to receive injections today as the injections  can temporarily increase her pain, which can increase her blood pressure, and put her at risk for stroke. Discussed starting the medication Valsartan 40mg  daily and logging blood pressures daily at home and she is agreeable. Medications reviewed and she is taking Amlodipine 5mg  and lopressor.  Provided with a list of foods that will help lower her BP.  BP reviewed and improved at 136/82 -continue clonidine -Advised checking BP daily at home and logging results to bring into follow-up appointment with PCP and myself. -Reviewed BP meds today.  -Advised regarding healthy foods that can help lower blood pressure and provided with a list:  PAIN:  Are you having pain? No  PRECAUTIONS: Fall  RED FLAGS: None   WEIGHT BEARING RESTRICTIONS: No  FALLS: Has patient fallen in last 6 months? Yes. Number of falls 1  LIVING ENVIRONMENT: Lives with: lives with their family and lives in a skilled nursing facility Lives in: Other SNF  Stairs: No Has following equipment at home: Hemi walker and Wheelchair (manual)  PLOF: Requires assistive device for independence, Needs assistance with ADLs, Needs assistance with homemaking, and WC level currently   PATIENT GOALS: move better - walking or transferring with hemi walking   OBJECTIVE:  Note: Objective measures were completed at Evaluation unless otherwise noted.  DIAGNOSTIC FINDINGS:  Head CT:  IMPRESSION: 1. No acute intracranial abnormality. 2. No acute displaced fracture or traumatic listhesis of the cervical spine.  Cervical CT:  MPRESSION: 1. No acute intracranial abnormality. 2. No acute displaced fracture or traumatic listhesis of the cervical spine.  COGNITION: Overall cognitive status: History of cognitive impairments - at baseline   SENSATION: Light touch: Impaired  Proprioception: Impaired  Able to detect depe pressure   COORDINATION: Increased tone on the RLE   EDEMA:  Mild RLE distal edema   MUSCLE TONE: RLE: Mild and  Moderate  MUSCLE LENGTH: Hamstrings: grossly limited    POSTURE: rounded shoulders, forward head, and posterior bias   LOWER EXTREMITY ROM:     Active  Right Eval Left Eval  Hip flexion  Limited to 5 deg  beyond 90 in sitting  Knee flexion  90deg in sitting  Knee extension  Lacking 10 deg full extension  Ankle dorsiflexion  none  Ankle plantarflexion  none   (Blank rows = not tested)  LOWER EXTREMITY MMT:    MMT Right Eval Left Eval  Hip flexion  2+  Hip extension    Hip abduction  2  Hip adduction  3  Hip internal rotation    Hip external rotation    Knee flexion  2+  Knee extension  3+  Ankle dorsiflexion  0  Ankle plantarflexion  0  Ankle inversion    Ankle eversion    (Blank rows = not tested)  BED MOBILITY:  Sit to supine Mod A Supine to sit Mod A Rolling to Right Min A Rolling to Left Min A  TRANSFERS: Assistive device utilized: Hemi walker  Sit to stand: Mod A Stand to sit: Mod A Chair to chair: Mod A Floor:  unable to perform     CURB:  Level of Assistance: Total A Assistive device utilized:  rail Curb Comments: unable to perform   STAIRS: Unable to perform at eval   GAIT: Gait pattern:  non-functional constant posterior bias , step to pattern, decreased stance time- Right, Right hip hike, lateral hip instability, and lateral lean- Left Distance walked: 1ft Assistive device utilized: Hemi walker Level of assistance: Mod A Comments: moderate cues for sequencing of HW and gait pattern in turns   FUNCTIONAL TESTS:  5 times sit to stand: 48 sec with mod Assist from PT  Timed up and go (TUG): unable to perform  10 meter walk test: 6 ft with min-mod assist and HW.  FIST:  Function In Sitting Test (FIST)  (1/2 femur on surface; hips/knees flexed to 90deg)   - indicate bed or mat table / step stool if used  SCORING KEY: 4 = Independent (completes task independently & successfully) 3 = Verbal Cues/Increased Time (completes task  independently & successfully and only needs more time/cues) 2 = Upper Extremity Support (must use UE for support or assistance to complete successfully) 1 = Needs Assistance (unable to complete w/o physical assist; DOCUMENT LEVEL: min, mod, max) 0 = Dependent (requires complete physical assist; unable to complete successfully even w/ physical assist)  Randomly Administer Once Throughout Exam  4 - Anterior Nudge (superior sternum)  4 - Posterior Nudge (between scapular spines)  4 - Lateral Nudge (to dominant side at acromion)     4 - Static sitting (30 seconds)  4 - Sitting, shake 'no' (left and right)  4 - Sitting, eyes closed (30 seconds)   3 - Sitting, lift foot (dominant side, lift foot 1 inch twice)    4 - Pick up object from behind (object at midline, hands breadth posterior)  1 mod assist - Forward reach (use dominant arm, must complete full motion) 1 mod assist - Lateral reach (use dominant arm, clear opposite ischial tuberosity) 1 mod assist  - Pick up object from floor (from between feet)   1 mod assist - Posterior scooting (move backwards 2 inches)  1 min assist  - Anterior scooting (move forward 2 inches)  1 min assist  - Lateral scooting (move to dominant side 2 inches)    TOTAL = 37/56  Notes/comments: apraxia limiting success of scooting and reacting tasks.  MCD > 5 points MCID for IP REHAB > 6 points      PATIENT SURVEYS:  To be completed  TREATMENT DATE: 12/05/2023   Stand pivot transfer to mat table with min-mod assist and control of RLE to improve step width in abduction in turn to sit on the R from WC.   Sit<>supine on mat table with   PT instructed pt in bed level HEP.  - Supine Bridge   - 10 reps - Supine Short Arc Quad - 12  reps - Bent Knee Fallouts with manual resistance to RLE -12 reps - Small Range Straight Leg Raise   - 15 reps - Supine Heel Slide  - 10 reps  - Seated Hip Abduction manual resistance - 10 reps - Seated Long Arc Quad - 12 reps - 2 hold - Seated March  - 10 reps - 2 hold  PT provided AAROM for th RLE for all supine therex except SAQ. And LAQ.   Standing weight shift R and L with UE supported on chair back x 10 bil with cues for posture.   Stand pivot to [facility] with min assist for safety to the L and UE supported on chair back.    PT assisted pt to correct placement of ankle strap on AFO with total A. Scooting in chair performed forward/reverse x 2 each.  Sit<>stand at rail with CGA for position of the RLE. Once in standing, pt noted to have been incontinent of bladder and brief and pants with saturated with urin, and cushion was damp.  Front dest notified daughter. No additional clothing available.  Pt declined assist from PT to to change into dry brief. PT session terminated, so that Pt could be transported home and changed into dry clothing and reduce risk of skin damage.      PATIENT EDUCATION: Education details: POC.  HEP.  Person educated: Patient and Child(ren) Education method: Explanation Education comprehension: verbalized understanding  HOME EXERCISE PROGRAM: Access Code: [email or web address] URL: [email or web address] Date: 12/05/2023 Prepared by: Grier Rocher  Exercises - Sit to Stand with Counter Support  - 1 x daily - 3-4 x weekly - 2 sets - 10 reps - Seated Long Arc Quad  - 1 x daily - 3-4 x weekly - 2 sets - 10 reps - 2 hold - Seated March  - 1 x daily - 3-4 x weekly - 2 sets - 10 reps - 2 hold - Standing Hip Abduction with Counter Support  - 1 x daily - 7 x weekly - 3 sets - 5 reps - Wide Stance with Counter Support  - 1 x daily - 7 x weekly - 3 sets - 2 reps - 30 hold - Seated Hip Abduction  - 1 x daily - 7 x weekly - 3 sets - 10 reps - Supine Bridge  - 1 x daily - 7 x weekly - 3 sets - 10 reps - Supine Short Arc Quad  - 1 x daily - 7 x weekly - 3 sets - 10  reps - Bent Knee Fallouts  - 1 x daily - 7 x weekly - 3 sets - 10 reps - Small Range Straight Leg Raise  - 1 x daily - 7 x weekly - 3 sets - 10 reps - Supine Heel Slide  - 1 x daily - 7 x weekly - 3 sets - 10 reps   GOALS: Goals reviewed with patient? Yes  SHORT TERM GOALS: Target date: 12/13/2023    Patient will be independent in home exercise program to improve strength/mobility for better functional independence with ADLs. Baseline: to be provided on visit 2  Goal  status: INITIAL   LONG TERM GOALS: Target date: 02/07/2024     2.  Patient (> 67 years old) will complete five times sit to stand test by 15 seconds and only min assist indicating an increased LE strength and improved balance. Baseline: 48 with mod assist from PT  Goal status: INITIAL  3.  Patient will increase FIST score by > 6 points to demonstrate decreased fall risk during functional activities Baseline: 37 Goal status: INITIAL  4.  Patient will complete 10 meter walk test to with HW in less than 1 minute with min assist as to improve gait speed for better community ambulation and to reduce fall risk. Baseline: 6 ft with HW and mod assist  Goal status: INITIAL  5.  Patient will perform stand pivot transfer to and from [facility] with HW and CGA for safety to allow safe transfer to toilet with daughter without use of rail on the wall. Baseline: mod assist and heavy use of rail.  Goal status: INITIAL  6.  Patient will improve bed mobility to min assist only to reduce care giver burden and improve safety of transfers  Baseline: mod-max assist Goal status: INITIAL  ASSESSMENT:  CLINICAL IMPRESSION:   Patient arrived motivated to participate. Instructed in home exercises at bed level and added seated therex to provide alternative to family for BLE strengthening and coordination training from seated and standing therex. Daughter not present for education, but hand out provided by PT.  Pt will benefit from skilled PT to  address strength and balance deficits to improve safety for transfers, reduce care giver burden, and allow return to PLOF and possible return home from Assisted living.   OBJECTIVE IMPAIRMENTS: Abnormal gait, cardiopulmonary status limiting activity, decreased activity tolerance, decreased balance, decreased cognition, decreased coordination, decreased endurance, decreased knowledge of use of DME, decreased mobility, difficulty walking, decreased ROM, decreased strength, hypomobility, increased muscle spasms, impaired flexibility, impaired sensation, impaired tone, impaired UE functional use, improper body mechanics, and postural dysfunction.   ACTIVITY LIMITATIONS: carrying, lifting, bending, standing, squatting, stairs, transfers, bed mobility, continence, bathing, toileting, dressing, reach over head, hygiene/grooming, and locomotion level  PARTICIPATION LIMITATIONS: cleaning, interpersonal relationship, shopping, and community activity  PERSONAL FACTORS: 3+ comorbidities: HTN, chronic CVA, Afib, Lupus   are also affecting patient's functional outcome.   REHAB POTENTIAL: Fair chronic CVA with poor progress in prior interventions with this clinic  CLINICAL DECISION MAKING: Unstable/unpredictable  EVALUATION COMPLEXITY: High  PLAN:  PT FREQUENCY: 1-2x/week  PT DURATION: 12 weeks  PLANNED INTERVENTIONS: 97110-Therapeutic exercises, 97530- Therapeutic activity, [id number]- Neuromuscular re-education, 97535- Self Care, 95621- Manual therapy, [phone]- Gait training, [phone]- Orthotic Fit/training, [phone]- Aquatic Therapy, [phone]- Splinting, [id number]- Electrical stimulation (unattended), [phone]- Electrical stimulation (manual), Patient/Family education, Balance training, Stair training, Joint mobilization, Vestibular training, Visual/preceptual remediation/compensation, Cognitive remediation, DME instructions, Wheelchair mobility training, Cryotherapy, and Moist heat  PLAN FOR NEXT SESSION:   Dynamic standing  balance. Gait. Transfers.    Golden Pop, PT 12/05/2023, 9:58 AM

## 2023-12-07 ENCOUNTER — Ambulatory Visit: Payer: Medicare Other | Admitting: Occupational Therapy

## 2023-12-07 ENCOUNTER — Ambulatory Visit: Payer: Medicare Other | Admitting: Speech Pathology

## 2023-12-08 NOTE — Therapy (Signed)
 OUTPATIENT OCCUPATIONAL THERAPY NEURO TREATMENT NOTE  Patient Name: Lindsey [name] MRN: [id number] DOB:Jan 16, 1956, 68 y.o., female Today's Date: 12/08/2023  PCP: Emogene Morgan, MD REFERRING PROVIDER: Sula Soda, MD  END OF SESSION:  OT End of Session - 12/08/23 1622     Visit Number 5    Number of Visits 24    Date for OT Re-Evaluation 02/06/24    OT Start Time 1015    OT Stop Time 1100    OT Time Calculation (min) 45 min    Activity Tolerance Patient tolerated treatment well    Behavior During Therapy WFL for tasks assessed/performed            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) 09/24/2021   Cerebral embolism with cerebral infarction 09/23/2021   Pyelonephritis 09/19/2021   Pain due to onychomycosis of toenails of both feet 11/19/2020   Normocytic anemia 05/31/2020   Acute ischemic left MCA stroke (HCC) 04/30/2020   Right hemiplegia (HCC) 04/30/2020   Cerebrovascular accident (HCC) 04/21/2020   Atrial fibrillation with RVR (HCC) 04/21/2020   Essential hypertension 04/21/2020   Alcohol abuse 04/21/2020   ONSET DATE: 09/19/21  REFERRING DIAG: CVA  THERAPY DIAG:  Muscle weakness (generalized)  Other lack  of coordination  Acute ischemic left MCA stroke (HCC)  Rationale for Evaluation and Treatment: Rehabilitation  SUBJECTIVE:  SUBJECTIVE STATEMENT: Pt reports doing well this date. Pt accompanied by: self, daughter  PERTINENT HISTORY: Pt. is a 68 y.o. 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 Pt. has Right sided weakness, and receptive, and expressive aphasia. Pt. has had a previous CVA with right sided weakness in July 2021. PMHx includes: AFib, Renal Artery thrombosis, situational depression, HTN, Pyelnephritis, Seizure activity, COnstipation, AKI. Vitamin D deficiency, and urinary incontinence   PRECAUTIONS: None  WEIGHT BEARING RESTRICTIONS: No  PAIN:  Are you having pain? 2/10 faces scale, LEs  FALLS: Has patient fallen in last 6 months? Yes. Number of falls 1-ER visit  LIVING ENVIRONMENT: Pt. resides at [facility]. Pt. has very supportive family Stairs: One level Has following equipment at home:  Equipment provided through [employer]  PLOF: Needs assistance with ADLs  PATIENT GOALS:  To improve ROM/flexibility in the RUE 2/2 increased tightness, and to improve ADL functioning in order to towards returning home.  OBJECTIVE:  Note: Objective measures were completed at Evaluation unless otherwise noted.  HAND DOMINANCE: Right  ADLs: Overall ADLs: Assist is provided from the staff at [facility]. Transfers/ambulation related to ADLs: Eating: independent, with set-up using the left hand Grooming: Independent with set-up using the left hand UB Dressing:  Increased assist required, Mod-MaxA doffing jacket LB Dressing: Increased assist required Toileting:  Staff Assists with toileting transfers, and toilet hygiene care. Bathing: Staff assists with bathing Tub Shower transfers: Requires staff assist Equipment: TBD  IADLs: Shopping: N/A Light housekeeping: Provided by staff Meal Prep: 3  Meals a day provided. Pt. Eats in the main dining room when family visits, otherwise Pt. Eats in her room.  Community mobility: Relies on family for transportation to therapy. Per daughter- Transportation is provided for services not offered at the facility. Medication management: Provided by the facility Financial management: Daughter manages Handwriting: TBD  MOBILITY STATUS: Hx of falls  FUNCTIONAL OUTCOME MEASURES:   UPPER EXTREMITY ROM:    Passive ROM Right Eval  Left Eval AROM WFL  Shoulder flexion 0(46)   Shoulder abduction 0(64)   Shoulder adduction    Shoulder extension    Shoulder internal rotation    Shoulder external rotation    Elbow flexion 0(98)   Elbow extension 0(full passive)   Wrist flexion 0(56)   Wrist extension -56(30)   Wrist ulnar deviation    Wrist radial deviation    Wrist pronation    Wrist supination    (Blank rows = not tested)  UPPER EXTREMITY MMT:     MMT Right eval Left Eval 5/5  Shoulder flexion 0/5   Shoulder abduction 0/5   Shoulder adduction    Shoulder extension    Shoulder internal rotation    Shoulder external rotation    Middle trapezius    Lower trapezius    Elbow flexion 0/5   Elbow extension 0/5   Wrist flexion    Wrist extension 0/5   Wrist ulnar deviation    Wrist radial deviation    Wrist pronation 0/5   Wrist supination 0/5   (Blank rows = not tested)  HAND FUNCTION: N/A  COORDINATION: N/A  SENSATION: Diminished  EDEMA: No edema noted in the right hand today  MUSCLE TONE: impaired  COGNITION: Overall cognitive status: Aphasia  VISION: Wears glasses, no change from baseline   PERCEPTION: TBD  PRAXIS: Impaired: Motor planning                                                                                                                           TREATMENT DATE:  Therapeutic Exercise: -PROM through all joint ranges of the RUE for shoulder flexion, abduction, horizontal abduction, elbow  flexion, extension, forearm supination, wrist extension, digit extension, and thumb abduction, working to reduce RUE stiffness to ease UB ADLs. -Review of self-PROM with hand-over-hand assist for table slides for bilat shoulder forward flexion; elbow ext, forearm sup, and wrist and digit flex/ext   Self Care: -Wc<>toilet transfer using grab bar on wall x1 trial; min A STS from wc pulling up on grab bar.  Mod A STS from standard toilet, L hand crossing over to pull up on grab bar on the R wall.   -MaxvVc for hand and foot placement during  transfer; tactile and vc for R/LLE sequencing during pivots -Set up/supv for peri care in sitting, anterior approach after voiding urine -Total assist for clothing management in standing, including brief change -With hemi-walker, completed additional step pivot transfers wc<>mat table for 2 more trials in prep for toilet transfers from a different direction (stepping toward unaffected side), as toilet transfer noted above was performed stepping toward R hemiparetic side.  Min-mod A for STS from wc, min A from mat table, mod vc for forward weight shifting and hand placement in prep for transfers.  PATIENT EDUCATION: Education details: Functional transfer training Person educated: Patient Education method: Explanation, Demonstration, Tactile cues, and Verbal cues Education comprehension: verbalized understanding, returned demonstration, and verbal cues required, tactile cues  HOME EXERCISE PROGRAM: RUE self PROM  GOALS: Goals reviewed with patient? Yes  SHORT TERM GOALS: Target date: 12/27/2023    Pt. Will demonstrate supervision with HEPs for BUEs Baseline: Eval: No current HEPs  Goal status: INITIAL  LONG TERM GOALS: Target date: 02/07/2024    1.  Pt. Will improve right shoulder flexion by 20 degrees to assist with UE dressing Baseline: Eval: Right: 0(46) Goal status: INITIAL  2.  Pt. Will improve with shoulder abduction by 20 degrees to assist with  underarm care, and hygiene Baseline: Eval: Right 0(64) Goal status: INITIAL  3.  Pt. Will improve right elbow flexion by 10 degrees to assist with hand to face patterns for gromming Baseline: Eval: Right 0(98) Goal status: INITIAL  4.  Pt. Will elicit active wrist extension, and digit extension in preparation for rubbing lotion into the let forearm, and hand  Baseline:  Goal status: INITIAL  5.  Pt. Will perform UE dressing skills with MinA Baseline: Eval: Mod-MaxA doffing a jacket Goal status: INITIAL  6. Pt. Will require minA toileting transfers/ clothing management  Baseline: Eval: Pt. Requires increased assist for staff  Goal status: INITIAL   ASSESSMENT:  CLINICAL IMPRESSION: Pt tolerated above noted activities well this date, though does fatigue with multiple transfers, requiring short rest breaks between.  Pt is able to perform STS from elevated surfaces (mat table) with min A and vc for hand placement, min A from wc when able to pull up on grab bar, and mod A from lower surfaces (standard toilet).  Pt demonstrated fair standing tolerance for clothing management following toileting while holding to grab bar.  Pt will continue to benefit from functional transfer training, ADL training, and tone management in the RUE in order to ease ADL performance, specifically improving ROM for underarm hygiene care and UE dressing, as well as to improve toileting and clothing management in order to reduce burden of care on caregivers.    PERFORMANCE DEFICITS: in functional skills including ADLs, IADLs, coordination, dexterity, proprioception, sensation, ROM, strength, pain, Fine motor control, Gross motor control, balance, body mechanics, decreased knowledge of use of DME, and UE functional use, cognitive skills including problem solving, and psychosocial skills including coping strategies, environmental adaptation, and routines and behaviors.   IMPAIRMENTS: are limiting patient from ADLs, IADLs,  and leisure.   CO-MORBIDITIES: may have co-morbidities  that affects occupational performance. Patient will benefit from skilled OT to address above impairments and improve overall function.  MODIFICATION OR ASSISTANCE TO COMPLETE EVALUATION: Min-Moderate modification of tasks or assist with assess necessary to complete an evaluation.  OT OCCUPATIONAL PROFILE AND HISTORY: Detailed assessment: Review of records and additional review of physical, cognitive, psychosocial history related to current functional performance.  CLINICAL DECISION MAKING: Moderate -  several treatment options, min-mod task modification necessary  REHAB POTENTIAL: Good  EVALUATION COMPLEXITY: Moderate    PLAN:  OT FREQUENCY: 2x/week  OT DURATION: 12 weeks  PLANNED INTERVENTIONS: 97168 OT Re-evaluation, 97535 self care/ADL training, 32951 therapeutic exercise, 97530 therapeutic activity, 97112 neuromuscular re-education, 97140 manual therapy, 97018 paraffin, 88416 moist heat, 97010 cryotherapy, 97034 contrast bath, passive range of motion, coping strategies training, patient/family education, and DME and/or AE instructions  RECOMMENDED OTHER SERVICES: PT, ST  CONSULTED AND AGREED WITH PLAN OF CARE: Patient  PLAN FOR NEXT SESSION: see above  Danelle Earthly, MS, OTR/L  12/08/2023, 4:23 PM

## 2023-12-12 ENCOUNTER — Ambulatory Visit: Payer: Medicare Other | Admitting: Speech Pathology

## 2023-12-12 ENCOUNTER — Ambulatory Visit: Payer: Medicare Other | Admitting: Physical Therapy

## 2023-12-12 ENCOUNTER — Ambulatory Visit: Payer: Medicare Other

## 2023-12-12 DIAGNOSIS — R262 Difficulty in walking, not elsewhere classified: Secondary | ICD-10-CM

## 2023-12-12 DIAGNOSIS — M6281 Muscle weakness (generalized): Secondary | ICD-10-CM

## 2023-12-12 DIAGNOSIS — R278 Other lack of coordination: Secondary | ICD-10-CM

## 2023-12-12 DIAGNOSIS — R4701 Aphasia: Secondary | ICD-10-CM | POA: Diagnosis not present

## 2023-12-12 DIAGNOSIS — R482 Apraxia: Secondary | ICD-10-CM

## 2023-12-12 DIAGNOSIS — R269 Unspecified abnormalities of gait and mobility: Secondary | ICD-10-CM

## 2023-12-12 DIAGNOSIS — R41841 Cognitive communication deficit: Secondary | ICD-10-CM

## 2023-12-12 DIAGNOSIS — I63512 Cerebral infarction due to unspecified occlusion or stenosis of left middle cerebral artery: Secondary | ICD-10-CM

## 2023-12-12 DIAGNOSIS — R2681 Unsteadiness on feet: Secondary | ICD-10-CM

## 2023-12-12 NOTE — Therapy (Signed)
 OUTPATIENT PHYSICAL THERAPY NEURO TREATMENT   Patient Name: Lindsey [name] MRN: [id number] DOB:25-Feb-1956, 68 y.o., female Today's Date: 12/12/2023   PCP: Emogene Morgan, MD  REFERRING PROVIDER: Horton Chin, MD   END OF SESSION:  PT End of Session - 12/12/23 0834     Visit Number 7    Number of Visits 24    Date for PT Re-Evaluation 02/06/24    PT Start Time 0845    PT Stop Time 0926    PT Time Calculation (min) 41 min    Equipment Utilized During Treatment Gait belt    Activity Tolerance Patient tolerated treatment well    Behavior During Therapy WFL for tasks assessed/performed             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   REFERRING DIAG:   Diagnosis  I69.398,R25.2 (ICD-10-CM) - Spasticity as late effect of cerebrovascular accident (CVA)    THERAPY DIAG:  Muscle weakness (generalized)  Other lack of coordination  Apraxia  Abnormality of gait and mobility  Difficulty in walking, not elsewhere classified  Unsteadiness on feet  Rationale for Evaluation and Treatment: Rehabilitation  SUBJECTIVE:  SUBJECTIVE STATEMENT: TODAY: no new updates. No family present for PT session.     From EVAL: Daughter present for Evaluation to provide hx. Pt is familiar to this clinic and has been seen for multiple bouts PT since initial CVA in 2021. Pt d/c'ed from PT serviced in April of 2024 with plans to receive PT services through [facility]. Daughter reports that she was only receiving minimal therapy in facility. Reports at least 1 fall in the last 6 months and daughter asked staff to transfer pt with lift since fall.  Daughter would want pt to demonstrate improve safety with transfers to allow bil transfers with reduced use of rail to allow pt to return home for assisted living.  Unable to discern whether pt in SNF or assisted living care from hx.    Pt accompanied by: self and family member   PERTINENT HISTORY:  1. Acute Ischemic Left MCA Stroke:Right Hemiplegia: Ordered outpatient OT, PT, and SLP. She has had a HFU appointment with Neurology. Discussed that I can provide new scripts next month if needed. Communicated with her providers regarding positive reinforcement. She has made excellent gains in strength as well as cognition and speech!   -Provided with new disability placard.    2. Essential Hypertension: BP is currently very elevated and discussed with patient that it would be unsafe for her to receive injections today as the injections  can temporarily increase her pain, which can increase her blood pressure, and put her at risk for stroke. Discussed starting the medication Valsartan 40mg  daily and logging blood pressures daily at home and she is agreeable. Medications reviewed and she is taking Amlodipine 5mg  and lopressor.  Provided with a list of foods that will help lower her BP.  BP reviewed and improved at 136/82 -continue clonidine -Advised checking BP daily at home and logging results to bring into follow-up appointment with PCP and myself. -Reviewed BP meds today.  -Advised regarding healthy foods that can help lower blood pressure and provided with a list:  PAIN:  Are you having pain? No  PRECAUTIONS: Fall  RED FLAGS: None   WEIGHT BEARING RESTRICTIONS: No  FALLS: Has patient fallen in last 6 months? Yes. Number of falls 1  LIVING ENVIRONMENT: Lives with: lives with their family and lives in a skilled nursing facility Lives in: Other SNF  Stairs: No Has following equipment at home: Hemi walker and Wheelchair (manual)  PLOF: Requires assistive device for independence, Needs assistance with ADLs, Needs assistance with homemaking, and WC level currently   PATIENT GOALS: move better - walking or transferring with hemi walking   OBJECTIVE:  Note: Objective measures were completed at Evaluation unless otherwise noted.  DIAGNOSTIC FINDINGS:  Head CT:  IMPRESSION: 1. No acute intracranial abnormality. 2. No acute displaced fracture or traumatic listhesis of the cervical spine.  Cervical CT:  MPRESSION: 1. No acute intracranial abnormality. 2. No acute displaced fracture or traumatic listhesis of the cervical spine.  COGNITION: Overall cognitive status: History of cognitive impairments - at baseline   SENSATION: Light touch: Impaired  Proprioception: Impaired  Able to detect depe pressure   COORDINATION: Increased tone on the RLE   EDEMA:  Mild RLE distal edema   MUSCLE TONE: RLE: Mild and  Moderate  MUSCLE LENGTH: Hamstrings: grossly limited    POSTURE: rounded shoulders, forward head, and posterior bias   LOWER EXTREMITY ROM:     Active  Right Eval Left Eval  Hip flexion  Limited to 5 deg  beyond 90 in sitting  Knee flexion  90deg in sitting  Knee extension  Lacking 10 deg full extension  Ankle dorsiflexion  none  Ankle plantarflexion  none   (Blank rows = not tested)  LOWER EXTREMITY MMT:    MMT Right Eval Left Eval  Hip flexion  2+  Hip extension    Hip abduction  2  Hip adduction  3  Hip internal rotation    Hip external rotation    Knee flexion  2+  Knee extension  3+  Ankle dorsiflexion  0  Ankle plantarflexion  0  Ankle inversion    Ankle eversion    (Blank rows = not tested)  BED MOBILITY:  Sit to supine Mod A Supine to sit Mod A Rolling to Right Min A Rolling to Left Min A  TRANSFERS: Assistive device utilized: Hemi walker  Sit to stand: Mod A Stand to sit: Mod A Chair to chair: Mod A Floor:  unable to perform     CURB:  Level of Assistance: Total A Assistive device utilized:  rail Curb Comments: unable to perform   STAIRS: Unable to perform at eval   GAIT: Gait pattern:  non-functional constant posterior bias , step to pattern, decreased stance time- Right, Right hip hike, lateral hip instability, and lateral lean- Left Distance walked: 28ft Assistive device utilized: Hemi walker Level of assistance: Mod A Comments: moderate cues for sequencing of HW and gait pattern in turns   FUNCTIONAL TESTS:  5 times sit to stand: 48 sec with mod Assist from PT  Timed up and go (TUG): unable to perform  10 meter walk test: 6 ft with min-mod assist and HW.  FIST:  Function In Sitting Test (FIST)  (1/2 femur on surface; hips/knees flexed to 90deg)   - indicate bed or mat table / step stool if used  SCORING KEY: 4 = Independent (completes task independently & successfully) 3 = Verbal Cues/Increased Time (completes task  independently & successfully and only needs more time/cues) 2 = Upper Extremity Support (must use UE for support or assistance to complete successfully) 1 = Needs Assistance (unable to complete w/o physical assist; DOCUMENT LEVEL: min, mod, max) 0 = Dependent (requires complete physical assist; unable to complete successfully even w/ physical assist)  Randomly Administer Once Throughout Exam  4 - Anterior Nudge (superior sternum)  4 - Posterior Nudge (between scapular spines)  4 - Lateral Nudge (to dominant side at acromion)     4 - Static sitting (30 seconds)  4 - Sitting, shake 'no' (left and right)  4 - Sitting, eyes closed (30 seconds)   3 - Sitting, lift foot (dominant side, lift foot 1 inch twice)    4 - Pick up object from behind (object at midline, hands breadth posterior)  1 mod assist - Forward reach (use dominant arm, must complete full motion) 1 mod assist - Lateral reach (use dominant arm, clear opposite ischial tuberosity) 1 mod assist  - Pick up object from floor (from between feet)   1 mod assist - Posterior scooting (move backwards 2 inches)  1 min assist  - Anterior scooting (move forward 2 inches)  1 min assist  - Lateral scooting (move to dominant side 2 inches)    TOTAL = 37/56  Notes/comments: apraxia limiting success of scooting and reacting tasks.  MCD > 5 points MCID for IP REHAB > 6 points      PATIENT SURVEYS:  To be completed  TREATMENT DATE: 12/12/2023  Sit<>stand with CGA-min assist throughout session with moderate cues for set up to scoot to edge of chair and anterior weight shift to stand pushing from arm rest.   Gait with HW x 53ft with min assist overall with occasional posterior LOB in turns when trying to look at the RLE. Reverse gait with HW x 56ft and mod-max cues for sequencing and min-mod assist for full weight  shift to advance the RLE.  Able to correct and activate hip extension and improve posture to prevent posterior LOB with cues and assist from PT. .     Stand pivot transfer to mat table with min-mod assist and control of RLE to improve step width in abduction in turn to sit on the R from WC.   Sit<>supine on mat table with min-mod assist for RLE management .   PT instructed pt in bed level HEP.  - Supine Bridge   - 10 reps - Supine Short Arc Quad - 12  reps - Bent Knee Fallouts with manual resistance to RLE -10 reps - Small Range Straight Leg Raise  - 8  reps AAROM on the RLE  - Supine Heel Slide  - 10 reps  PT provided AAROM for th RLE for all supine therex except SAQ. And LAQ.  Stand pivot to [facility] with min assist for safety to the L and UE supported on chair back.    PATIENT EDUCATION: Education details: POC.  HEP.  Person educated: Patient and Child(ren) Education method: Explanation Education comprehension: verbalized understanding  HOME EXERCISE PROGRAM: Access Code: [email or web address] URL: [email or web address] Date: 12/05/2023 Prepared by: Grier Rocher  Exercises - Sit to Stand with Counter Support  - 1 x daily - 3-4 x weekly - 2 sets - 10 reps - Seated Long Arc Quad  - 1 x daily - 3-4 x weekly - 2 sets - 10 reps - 2 hold - Seated March  - 1 x daily - 3-4 x weekly - 2 sets - 10 reps - 2 hold - Standing Hip Abduction with Counter Support  - 1 x daily - 7 x weekly - 3 sets - 5 reps - Wide Stance with Counter Support  - 1 x daily - 7 x weekly - 3 sets - 2 reps - 30 hold - Seated Hip Abduction  - 1 x daily - 7 x weekly - 3 sets - 10 reps - Supine Bridge  - 1 x daily - 7 x weekly - 3 sets - 10 reps - Supine Short Arc Quad  - 1 x daily - 7 x weekly - 3 sets - 10 reps - Bent Knee Fallouts  - 1 x daily - 7 x weekly - 3 sets - 10 reps - Small Range Straight Leg Raise  - 1 x daily - 7 x weekly - 3 sets - 10 reps - Supine Heel Slide  - 1 x daily - 7 x weekly - 3 sets - 10  reps   GOALS: Goals reviewed with patient? Yes  SHORT TERM GOALS: Target date: 12/13/2023    Patient will be independent in home exercise program to improve strength/mobility for better functional independence with ADLs. Baseline: to be provided on visit 2  Goal status: INITIAL   LONG TERM GOALS: Target date: 02/07/2024     2.  Patient (> 49 years old) will complete five times sit to stand test by 15 seconds and only min assist indicating an increased LE strength and improved balance. Baseline:  48 with mod assist from PT  Goal status: INITIAL  3.  Patient will increase FIST score by > 6 points to demonstrate decreased fall risk during functional activities Baseline: 37 Goal status: INITIAL  4.  Patient will complete 10 meter walk test to with HW in less than 1 minute with min assist as to improve gait speed for better community ambulation and to reduce fall risk. Baseline: 6 ft with HW and mod assist  Goal status: INITIAL  5.  Patient will perform stand pivot transfer to and from [facility] with HW and CGA for safety to allow safe transfer to toilet with daughter without use of rail on the wall. Baseline: mod assist and heavy use of rail.  Goal status: INITIAL  6.  Patient will improve bed mobility to min assist only to reduce care giver burden and improve safety of transfers  Baseline: mod-max assist Goal status: INITIAL  ASSESSMENT:  CLINICAL IMPRESSION:   Patient arrived motivated to participate. PT treatment focused on gait training with HW. Significantly improve posture and limb advancement on the RLE on this day requiring only min-mod assist for 80ft. Mod assist for reverse gait with HW. Difficulty with HS activation with swing. Family member instructed in bed level HEP to perform in addition to standing therex. Noted to have improved  ROM and isolated activation in all directions for RLE on this day. Pt will benefit from skilled PT to address strength and balance deficits to  improve safety for transfers, reduce care giver burden, and allow return to PLOF and possible return home from Assisted living.   OBJECTIVE IMPAIRMENTS: Abnormal gait, cardiopulmonary status limiting activity, decreased activity tolerance, decreased balance, decreased cognition, decreased coordination, decreased endurance, decreased knowledge of use of DME, decreased mobility, difficulty walking, decreased ROM, decreased strength, hypomobility, increased muscle spasms, impaired flexibility, impaired sensation, impaired tone, impaired UE functional use, improper body mechanics, and postural dysfunction.   ACTIVITY LIMITATIONS: carrying, lifting, bending, standing, squatting, stairs, transfers, bed mobility, continence, bathing, toileting, dressing, reach over head, hygiene/grooming, and locomotion level  PARTICIPATION LIMITATIONS: cleaning, interpersonal relationship, shopping, and community activity  PERSONAL FACTORS: 3+ comorbidities: HTN, chronic CVA, Afib, Lupus   are also affecting patient's functional outcome.   REHAB POTENTIAL: Fair chronic CVA with poor progress in prior interventions with this clinic  CLINICAL DECISION MAKING: Unstable/unpredictable  EVALUATION COMPLEXITY: High  PLAN:  PT FREQUENCY: 1-2x/week  PT DURATION: 12 weeks  PLANNED INTERVENTIONS: 97110-Therapeutic exercises, 97530- Therapeutic activity, [id number]- Neuromuscular re-education, 97535- Self Care, 32440- Manual therapy, [phone]- Gait training, [phone]- Orthotic Fit/training, [phone]- Aquatic Therapy, [phone]- Splinting, 97014- Electrical stimulation (unattended), [phone]- Electrical stimulation (manual), Patient/Family education, Balance training, Stair training, Joint mobilization, Vestibular training, Visual/preceptual remediation/compensation, Cognitive remediation, DME instructions, Wheelchair mobility training, Cryotherapy, and Moist heat  PLAN FOR NEXT SESSION:   Continue  standing balance. Gait. Transfers.    Golden Pop, PT 12/12/2023, 8:35 AM

## 2023-12-12 NOTE — Therapy (Unsigned)
 OUTPATIENT SPEECH LANGUAGE PATHOLOGY  TREATMENT NOTE   Patient Name: Lindsey [name] MRN: [id number] DOB:Jul 13, 1956, 68 y.o., female Today's Date: 12/12/2023  PCP: Karie Fetch, MD REFERRING PROVIDER: Sula Soda, MD   End of Session - 12/12/23 1021     Visit Number 4    Number of Visits 17    Date for SLP Re-Evaluation 01/11/24    Authorization Type Medicare Part A/ Medicare Part B    Progress Note Due on Visit 10    SLP Start Time 0930    SLP Stop Time  1015    SLP Time Calculation (min) 45 min    Activity Tolerance Patient tolerated treatment well             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) 09/24/2021   Cerebral embolism with cerebral infarction 09/23/2021   Pyelonephritis 09/19/2021   Pain due to onychomycosis of toenails of both feet 11/19/2020   Normocytic anemia 05/31/2020   Acute ischemic left MCA stroke (HCC) 04/30/2020   Right hemiplegia (HCC) 04/30/2020   Cerebrovascular accident (HCC) 04/21/2020   Atrial fibrillation with RVR (HCC) 04/21/2020   Essential hypertension 04/21/2020   Alcohol abuse 04/21/2020    ONSET DATE: 04/30/2020 initial CVA; 10/25/2020  second CVA;  11/07/2023 date of  referral  REFERRING DIAG: R47.01 (ICD-10-CM) - Aphasia   THERAPY DIAG:  Aphasia  Cognitive communication deficit  Apraxia  Rationale for Evaluation and Treatment Rehabilitation  SUBJECTIVE:   PERTINENT HISTORY and DIAGNOSTIC FINDINGS:   Patient is a 68 y.o. female with the following prior history:  MRI 04/21/2020 Brain: Acute infarct left MCA territory. Infarct involves the left frontal lobe, insula as well as the caudate and putamen. There are areas of hemorrhage within the caudate and putamen as well as in the left frontal lobe.  Scattered small white matter hyperintensities elsewhere compatible with chronic microvascular ischemia. Ventricle size normal. Negative for mass lesion.  Inpatient Rehabilitation ST Services: 04/2020 thru 05/2020 Outpatient ST services 08/2020 thru 08/2021 Pt discharged with Lingraphica Device; per chart using device to assist with communication  MRI 09/22/2021 IMPRESSION: 1. Acute moderate size Left AICA cerebellar infarct. Petechial hemorrhage but no malignant hemorrhagic transformation. Cytotoxic edema but no posterior fossa mass effect at this time.  2. Underlying extensive chronic left MCA territory encephalomalacia related to the 2021 infarct. Associated left brainstem Wallerian degeneration since last year. And small superimposed chronic Left PICA cerebellar infarcts, but also new since last year.   Inpatient Rehabilitation ST Services at [facility]: 10/02/2021 thru 10/21/2021 Outpatient ST Services [facility] 10/2021 thru 03/08/2022 Pt discharged d/t reaching maximal rehabilitation potential; "3/4 LTGs particially met due to limited caregiver availability"  Pt  is currently residing at a SNF [facility]). Additional medical history includes alcoholism,  AFib, Renal Artery thrombosis, situational depression, HTN, Pyelnephritis, Seizure activity, Constipation, AKI. Vitamin D deficiency, and urinary incontinence.   PAIN:  Are you having  pain? No  FALLS: Has patient fallen in last 6 months?  See PT evaluation for details  LIVING ENVIRONMENT: Lives with: lives with their family and lives in a skilled nursing facility   PLOF:  Level of assistance: Needed assistance with ADLs, Needed assistance with IADLS   PATIENT GOALS    pt unable to state - her daughter reports she would like for pt to be able to "communicate basics, return to abilities before second stroke (09/2021)"  SUBJECTIVE STATEMENT: Pt arrived to session with her daughter Pt accompanied by: family member;   OBJECTIVE:   TODAY'S TREATMENT:     Skilled treatment session focused on pt's communication goals. SLP faciltiated session by providing the following interventions:  This Clinical research associate provided education from [employer] on replacement on pt's device should it be stolen.   Further time spent introducing 2x2 grid of icons with pt 100% in selecting requested communication. SLP  attached picture grid to pt's wheelchair with pt able to locate with ease and point to pictures while attached to wheelchair.   SLP assessed pt's ability to read at the word level by having pt pair written word with object in field of 2. Pt able to pair in 8 out of 11 opportunities and was able to verbally repeat word with 80% accuracy.   Pt desires some worksheets for practice with her daughter. SLP will provide during next session.    PATIENT EDUCATION: Education details: SGD, low Economist Person educated: Patient and Child(ren) Education method: Explanation Education comprehension: needs further education  HOME EXERCISE PROGRAM:   Use low tech communication    GOALS:  Goals reviewed with patient? Yes  SHORT TERM GOALS: Target date: 10 sessions  Given verbal or visual prompt, pt will correctly ID written word/object/icon in field of 4-6 to communicate wants/needs with 75% accuracy given occasional mod A over 2 sessions  Baseline: Goal status: INITIAL   LONG  TERM GOALS: Target date: 01/11/2024  Pt will utilize multimodal communication to express basic wants/needs as reported by pt and her daughter in 3 out of 5 opportunities.  Baseline:  Goal status: INITIAL   ASSESSMENT:  CLINICAL IMPRESSION: Patient is a 68 y.o. right handed female who was seen today for a speech language treatment d/t history of Left MCA CVA. 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 Her verbal language Is halting and while she does produce some islands of improved speech intelligibility during naming tasks and spontaneous responses, she speech contains neologisms with evidence of word finding difficulty. Pt's daughter reports that pt doesn't use her Lingraphica device "as much as she should" and it is only available to her during weekends at home for fear that it might be lost at pt's facility.   Pt engaged in creating some pictures to represent activities. See the above treatment note for details.   OBJECTIVE IMPAIRMENTS include attention, aphasia, apraxia, and dysarthria. These impairments are limiting patient from effectively communicating at home and in community. Factors affecting potential to achieve goals and functional outcome are ability to learn/carryover information, co-morbidities, cooperation/participation level, medical prognosis, previous level of function, severity of impairments, family/community support, and time post onset . Patient will  benefit from skilled SLP services to address above impairments and improve overall function.  REHAB POTENTIAL: Fair time post onset, multiple courses of ST services, decreased carryover of therapy concepts during previous sessions; pt discharged from previous course of ST services (2023) requiring moderate assistance for communication  PLAN: SLP FREQUENCY: 1-2x/week  SLP DURATION: 8 weeks  PLANNED INTERVENTIONS: Language  facilitation, Internal/external aids, Functional tasks, Multimodal communication approach, SLP instruction and feedback, Compensatory strategies, Patient/family education, and [id number] Treatment of speech (30 or 45 min)     Amariana Mirando B. Dreama Saa, M.S., CCC-SLP, Tree surgeon Certified Brain Injury Specialist [facility]  [facility] Rehabilitation Services Office [phone] Ascom [phone] Fax [phone]
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
## 2023-12-14 ENCOUNTER — Ambulatory Visit: Payer: Medicare Other | Admitting: Occupational Therapy

## 2023-12-14 ENCOUNTER — Ambulatory Visit: Payer: Medicare Other | Admitting: Speech Pathology

## 2023-12-14 ENCOUNTER — Ambulatory Visit: Payer: Medicare Other

## 2023-12-14 DIAGNOSIS — R41841 Cognitive communication deficit: Secondary | ICD-10-CM

## 2023-12-14 DIAGNOSIS — R269 Unspecified abnormalities of gait and mobility: Secondary | ICD-10-CM

## 2023-12-14 DIAGNOSIS — M6281 Muscle weakness (generalized): Secondary | ICD-10-CM

## 2023-12-14 DIAGNOSIS — R2681 Unsteadiness on feet: Secondary | ICD-10-CM

## 2023-12-14 DIAGNOSIS — R278 Other lack of coordination: Secondary | ICD-10-CM

## 2023-12-14 DIAGNOSIS — R4701 Aphasia: Secondary | ICD-10-CM

## 2023-12-14 DIAGNOSIS — R482 Apraxia: Secondary | ICD-10-CM

## 2023-12-14 DIAGNOSIS — R262 Difficulty in walking, not elsewhere classified: Secondary | ICD-10-CM

## 2023-12-14 NOTE — Therapy (Signed)
 OUTPATIENT SPEECH LANGUAGE PATHOLOGY  TREATMENT NOTE   Patient Name: Lindsey [name] MRN: [id number] DOB:25-Sep-1956, 68 y.o., female Today's Date: 12/14/2023  PCP: Karie Fetch, MD REFERRING PROVIDER: Sula Soda, MD   End of Session - 12/14/23 0819     Visit Number 5    Number of Visits 17    Date for SLP Re-Evaluation 01/11/24    Authorization Type Medicare Part A/ Medicare Part B    Progress Note Due on Visit 10    SLP Start Time 0805    SLP Stop Time  0845    SLP Time Calculation (min) 40 min    Activity Tolerance Patient tolerated treatment well             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) 09/24/2021   Cerebral embolism with cerebral infarction 09/23/2021   Pyelonephritis 09/19/2021   Pain due to onychomycosis of toenails of both feet 11/19/2020   Normocytic anemia 05/31/2020   Acute ischemic left MCA stroke (HCC) 04/30/2020   Right hemiplegia (HCC) 04/30/2020   Cerebrovascular accident (HCC) 04/21/2020   Atrial fibrillation with RVR (HCC) 04/21/2020   Essential hypertension 04/21/2020   Alcohol abuse 04/21/2020    ONSET DATE: 04/30/2020 initial CVA; 10/25/2020  second CVA;  11/07/2023 date of  referral  REFERRING DIAG: R47.01 (ICD-10-CM) - Aphasia   THERAPY DIAG:  Aphasia  Cognitive communication deficit  Apraxia  Rationale for Evaluation and Treatment Rehabilitation  SUBJECTIVE:   PERTINENT HISTORY and DIAGNOSTIC FINDINGS:   Patient is a 68 y.o. female with the following prior history:  MRI 04/21/2020 Brain: Acute infarct left MCA territory. Infarct involves the left frontal lobe, insula as well as the caudate and putamen. There are areas of hemorrhage within the caudate and putamen as well as in the left frontal lobe.  Scattered small white matter hyperintensities elsewhere compatible with chronic microvascular ischemia. Ventricle size normal. Negative for mass lesion.  Inpatient Rehabilitation ST Services: 04/2020 thru 05/2020 Outpatient ST services 08/2020 thru 08/2021 Pt discharged with Lingraphica Device; per chart using device to assist with communication  MRI 09/22/2021 IMPRESSION: 1. Acute moderate size Left AICA cerebellar infarct. Petechial hemorrhage but no malignant hemorrhagic transformation. Cytotoxic edema but no posterior fossa mass effect at this time.  2. Underlying extensive chronic left MCA territory encephalomalacia related to the 2021 infarct. Associated left brainstem Wallerian degeneration since last year. And small superimposed chronic Left PICA cerebellar infarcts, but also new since last year.   Inpatient Rehabilitation ST Services at [facility]: 10/02/2021 thru 10/21/2021 Outpatient ST Services [facility] 10/2021 thru 03/08/2022 Pt discharged d/t reaching maximal rehabilitation potential; "3/4 LTGs particially met due to limited caregiver availability"  Pt  is currently residing at a SNF [facility]). Additional medical history includes alcoholism,  AFib, Renal Artery thrombosis, situational depression, HTN, Pyelnephritis, Seizure activity, Constipation, AKI. Vitamin D deficiency, and urinary incontinence.   PAIN:  Are you having  pain? No  FALLS: Has patient fallen in last 6 months?  See PT evaluation for details  LIVING ENVIRONMENT: Lives with: lives with their family and lives in a skilled nursing facility   PLOF:  Level of assistance: Needed assistance with ADLs, Needed assistance with IADLS   PATIENT GOALS    pt unable to state - her daughter reports she would like for pt to be able to "communicate basics, return to abilities before second stroke (09/2021)"  SUBJECTIVE STATEMENT: Pt arrived to session brought by her facility Pt accompanied by: family member;   OBJECTIVE:   TODAY'S TREATMENT:     Skilled treatment session focused on her communication goals. SLP facilitated the session by providing the following interventions:  Given that pt had indicated desire for some homework, SLP provided activities from the [facility] vocabulary book. Specifically pages 122 thru 127. With verbal model pt able to repeat names of basic objects with > 80% intelligibility and match picture of objects to printed word.    PATIENT EDUCATION: Education details: use of low Economist  Person educated: Patient and Child(ren) Education method: Explanation Education comprehension: needs further education  HOME EXERCISE PROGRAM:   Worksheets provided to complete when her daughter visits her    GOALS:  Goals reviewed with patient? Yes  SHORT TERM GOALS: Target date: 10 sessions  Given verbal or visual prompt, pt will correctly ID written word/object/icon in field of 4-6 to communicate wants/needs with 75% accuracy given occasional mod A over 2 sessions  Baseline: Goal status: INITIAL   LONG TERM GOALS: Target date: 01/11/2024  Pt will utilize multimodal communication to express basic wants/needs as reported by pt and her daughter in 3 out of 5 opportunities.  Baseline:  Goal status: INITIAL   ASSESSMENT:  CLINICAL IMPRESSION: Patient is a 68 y.o. right handed female who was seen today for a speech  language treatment d/t history of Left MCA CVA. 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 Her verbal language Is halting and while she does produce some islands of improved speech intelligibility during naming tasks and spontaneous responses, she speech contains neologisms with evidence of word finding difficulty. Pt's daughter reports that pt doesn't use her Lingraphica device "as much as she should" and it is only available to her during weekends at home for fear that it might be lost at pt's facility.   Pt appeared to enjoy doing some worksheets and wanted to send some home with her for continued practice.  See the above treatment note for details.   OBJECTIVE IMPAIRMENTS include attention, aphasia, apraxia, and dysarthria. These impairments are limiting patient from effectively communicating at home and in community. Factors affecting potential to achieve goals and functional outcome are ability to learn/carryover information, co-morbidities, cooperation/participation level, medical prognosis, previous level of function, severity of impairments, family/community support, and time post onset . Patient will benefit from skilled SLP services to address above impairments and improve overall function.  REHAB POTENTIAL: Fair time post onset, multiple courses of ST services, decreased carryover of therapy concepts during previous sessions; pt discharged from previous course of ST services (2023) requiring moderate assistance for communication  PLAN: SLP FREQUENCY: 1-2x/week  SLP DURATION: 8 weeks  PLANNED INTERVENTIONS: Language facilitation, Internal/external aids, Functional tasks, Multimodal communication approach, SLP instruction and feedback, Compensatory strategies, Patient/family education, and 04540 Treatment of speech (30 or 45 min)     Petrita Blunck B. Dreama Saa, M.S., CCC-SLP, Research officer, trade union Certified Brain Injury Specialist [facility]  [facility] Rehabilitation Services Office [phone] Ascom [phone] Fax [phone]

## 2023-12-14 NOTE — Therapy (Signed)
 OUTPATIENT PHYSICAL THERAPY NEURO TREATMENT   Patient Name: Khristine Verno MRN: [id number] DOB:[birth year], 68 y.o., female Today's Date: 12/14/2023   PCP: Emogene Morgan, MD  REFERRING PROVIDER: Horton Chin, MD   END OF SESSION:  PT End of Session - 12/14/23 0849     Visit Number 8    Number of Visits 24    Date for PT Re-Evaluation 02/06/24    Progress Note Due on Visit 10    PT Start Time 0846    PT Stop Time 0928    PT Time Calculation (min) 42 min    Equipment Utilized During Treatment Gait belt    Activity Tolerance Patient tolerated treatment well    Behavior During Therapy WFL for tasks assessed/performed              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 No pertinent surgical history. 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   REFERRING DIAG:  Diagnosis  I69.398,R25.2 (ICD-10-CM) - Spasticity as late effect of cerebrovascular accident (CVA)    THERAPY DIAG:  Muscle weakness (generalized)  Other lack of coordination  Abnormality of gait and mobility  Difficulty in walking, not elsewhere classified  Unsteadiness on feet  Rationale for Evaluation and Treatment: Rehabilitation  SUBJECTIVE:  SUBJECTIVE STATEMENT: TODAY: Patient states "Doing Okay."     From EVAL: Daughter present for Evaluation to provide hx. Pt is familiar to this clinic and has been seen for multiple bouts PT since initial CVA in 2021. Pt d/c'ed from PT serviced in April of 2024 with plans to receive PT services through [facility]. Daughter reports that she was only receiving minimal therapy in facility. Reports at least 1 fall in the last 6 months and daughter asked staff to transfer pt with lift since fall.  Daughter would want pt to demonstrate improve safety with transfers to allow bil transfers with reduced use of rail to allow pt to return home for assisted living.  Unable to discern whether pt in SNF or assisted living care from hx.    Pt accompanied by: self and family member   PERTINENT HISTORY:  1. Acute Ischemic Left MCA Stroke:Right Hemiplegia: Ordered outpatient OT, PT, and SLP. She has had a HFU appointment with Neurology. Discussed that I can provide new scripts next month if needed. Communicated with her providers regarding positive reinforcement. She has made excellent gains in strength as well as cognition and speech!   -Provided with new disability placard.    2. Essential Hypertension: BP is currently very elevated and discussed with patient that it would be unsafe for her to receive injections  today as the injections can temporarily increase her pain, which can increase her blood pressure, and put her at risk for stroke. Discussed starting the medication Valsartan 40mg  daily and logging blood pressures daily at home and she is agreeable. Medications reviewed and she is taking Amlodipine 5mg  and lopressor.  Provided with a list of foods that will help lower her BP.  BP reviewed and improved at 136/82 -continue clonidine -Advised checking BP daily at home and logging results to bring into follow-up appointment with PCP and myself. -Reviewed BP meds today.  -Advised regarding healthy foods that can help lower blood pressure and provided with a list:  PAIN:  Are you having pain? No  PRECAUTIONS: Fall  RED FLAGS: None   WEIGHT BEARING RESTRICTIONS: No  FALLS: Has patient fallen in last 6 months? Yes. Number of falls 1  LIVING ENVIRONMENT: Lives with: lives with their family and lives in a skilled nursing facility Lives in: Other SNF  Stairs: No Has following equipment at home: Hemi walker and Wheelchair (manual)  PLOF: Requires assistive device for independence, Needs assistance with ADLs, Needs assistance with homemaking, and WC level currently   PATIENT GOALS: move better - walking or transferring with hemi walking   OBJECTIVE:  Note: Objective measures were completed at Evaluation unless otherwise noted.  DIAGNOSTIC FINDINGS:  Head CT:  IMPRESSION: 1. No acute intracranial abnormality. 2. No acute displaced fracture or traumatic listhesis of the cervical spine.  Cervical CT:  MPRESSION: 1. No acute intracranial abnormality. 2. No acute displaced fracture or traumatic listhesis of the cervical spine.  COGNITION: Overall cognitive status: History of cognitive impairments - at baseline   SENSATION: Light touch: Impaired  Proprioception: Impaired  Able to detect depe pressure   COORDINATION: Increased tone on the RLE   EDEMA:  Mild RLE distal edema    MUSCLE TONE: RLE: Mild and Moderate  MUSCLE LENGTH: Hamstrings: grossly limited    POSTURE: rounded shoulders, forward head, and posterior bias   LOWER EXTREMITY ROM:     Active  Right Eval Left Eval  Hip flexion  Limited to 5 deg beyond 90 in sitting  Knee flexion  90deg in sitting  Knee extension  Lacking 10 deg full extension  Ankle dorsiflexion  none  Ankle plantarflexion  none   (Blank rows = not tested)  LOWER EXTREMITY MMT:    MMT Right Eval Left Eval  Hip flexion  2+  Hip extension    Hip abduction  2  Hip adduction  3  Hip internal rotation    Hip external rotation    Knee flexion  2+  Knee extension  3+  Ankle dorsiflexion  0  Ankle plantarflexion  0  Ankle inversion    Ankle eversion    (Blank rows = not tested)  BED MOBILITY:  Sit to supine Mod A Supine to sit Mod A Rolling to Right Min A Rolling to Left Min A  TRANSFERS: Assistive device utilized: Hemi walker  Sit to stand: Mod A Stand to sit: Mod A Chair to chair: Mod A Floor:  unable to perform     CURB:  Level of Assistance: Total A Assistive device utilized:  rail Curb Comments: unable to perform   STAIRS: Unable to perform at eval   GAIT: Gait pattern:  non-functional constant posterior bias , step to pattern, decreased stance time- Right, Right hip hike, lateral hip instability, and lateral lean- Left Distance walked: 64ft Assistive device utilized: Hemi walker Level of assistance: Mod A Comments: moderate cues for sequencing of HW and gait pattern in turns   FUNCTIONAL TESTS:  5 times sit to stand: 48 sec with mod Assist from PT  Timed up and go (TUG): unable to perform  10 meter walk test: 6 ft with min-mod assist and HW.  FIST:  Function In Sitting Test (FIST)  (1/2 femur on surface; hips/knees flexed to 90deg)   - indicate bed or mat table / step stool if used  SCORING KEY: 4 = Independent (completes task independently & successfully) 3 = Verbal Cues/Increased  Time (completes task independently & successfully and only needs more time/cues) 2 = Upper Extremity Support (must use UE for support or assistance to complete successfully) 1 = Needs Assistance (unable to complete w/o physical assist; DOCUMENT LEVEL: min, mod, max) 0 = Dependent (requires complete physical assist; unable to complete successfully even w/ physical assist)  Randomly Administer Once Throughout Exam  4 - Anterior Nudge (superior sternum)  4 - Posterior Nudge (between scapular spines)  4 - Lateral Nudge (to dominant side at acromion)     4 - Static sitting (30 seconds)  4 - Sitting, shake 'no' (left and right)  4 - Sitting, eyes closed (30 seconds)   3 - Sitting, lift foot (dominant side, lift foot 1 inch twice)    4 - Pick up object from behind (object at midline, hands breadth posterior)  1 mod assist - Forward reach (use dominant arm, must complete full motion) 1 mod assist - Lateral reach (use dominant arm, clear opposite ischial tuberosity) 1 mod assist  - Pick up object from floor (from between feet)   1 mod assist - Posterior scooting (move backwards 2 inches)  1 min assist  - Anterior scooting (move forward 2 inches)  1 min assist  - Lateral scooting (move to dominant side 2 inches)    TOTAL = 37/56  Notes/comments: apraxia limiting success of scooting and reacting tasks.  MCD > 5 points MCID for IP REHAB > 6 points      PATIENT SURVEYS:  To be completed  TREATMENT DATE: 12/14/2023   Therapeutic activities:  Sit<>stand with CGA-min assist  x 5 from raised height of mat - approx 23 in then down to 20 in and later from w/c throughout session with moderate cues for set up including scooting  to edge of chair, assist with RLE placement and some VC for anterior weight shift and VC to stand pushing from arm rest.   Stand pivot transfer  (mat table  to/from w/c) x 4 with min-mod assist and control of RLE to improve step width in abduction in turn to sit on the R from WC.   THEREX: LAQ with 3 sec hold 2 sets of 10 reps (difficulty with eccentric control)  AAROM- Ham curls seated 2 sets x 10 reps PROM right knee- flexed into 90+deg of knee flexion  Gait training:   Gait with HW x approx 73ft with min assist Environmental education officer on affected side providing forearm support; VC for gait sequencing with less overall with posterior LOB    PATIENT EDUCATION: Education details: POC.  HEP.  Person educated: Patient and Child(ren) Education method: Explanation Education comprehension: verbalized understanding  HOME EXERCISE PROGRAM: Access Code: [email or web address] URL: [email or web address] Date: 12/05/2023 Prepared by: Grier Rocher  Exercises - Sit to Stand with Counter Support  - 1 x daily - 3-4 x weekly - 2 sets - 10 reps - Seated Long Arc Quad  - 1 x daily - 3-4 x weekly - 2 sets - 10 reps - 2 hold - Seated March  - 1 x daily - 3-4 x weekly - 2 sets - 10 reps - 2 hold - Standing Hip Abduction with Counter Support  - 1 x daily - 7 x weekly - 3 sets - 5 reps - Wide Stance with Counter Support  - 1 x daily - 7 x weekly - 3 sets - 2 reps - 30 hold - Seated Hip Abduction  - 1 x daily - 7 x weekly - 3 sets - 10 reps - Supine Bridge  - 1 x daily - 7 x weekly - 3 sets - 10 reps - Supine Short Arc Quad  - 1 x daily - 7 x weekly - 3 sets - 10 reps - Bent Knee Fallouts  - 1 x daily - 7 x weekly - 3 sets - 10 reps - Small Range Straight Leg Raise  - 1 x daily - 7 x weekly - 3 sets - 10 reps - Supine Heel Slide  - 1 x daily - 7 x weekly - 3 sets - 10 reps   GOALS: Goals reviewed with patient? Yes  SHORT TERM GOALS: Target date: 12/13/2023    Patient will be independent in home exercise program to improve strength/mobility for better functional independence with ADLs. Baseline: to be provided on visit 2  Goal status:  INITIAL   LONG TERM GOALS: Target date: 02/07/2024     2.  Patient (> 23 years old) will complete five times sit to stand test by 15 seconds and only min assist indicating an increased LE strength and improved balance. Baseline: 48 with mod assist from PT  Goal status: INITIAL  3.  Patient will increase FIST score by > 6 points to demonstrate decreased fall risk during functional activities Baseline: 37 Goal status: INITIAL  4.  Patient will complete 10 meter walk test to with HW in less than 1 minute with min assist as to improve gait speed for better community ambulation and to reduce fall risk. Baseline: 6 ft  with HW and mod assist  Goal status: INITIAL  5.  Patient will perform stand pivot transfer to and from [facility] with HW and CGA for safety to allow safe transfer to toilet with daughter without use of rail on the wall. Baseline: mod assist and heavy use of rail.  Goal status: INITIAL  6.  Patient will improve bed mobility to min assist only to reduce care giver burden and improve safety of transfers  Baseline: mod-max assist Goal status: INITIAL  ASSESSMENT:  CLINICAL IMPRESSION:   Treatment continued per previous plan- Transfers and gait training Patient arrived motivated to participate. Overall much improved transfer ability after practice and patient with better recall than in previous episodes of care. Able to stand well with only VC - CGA and able to increase her overall gait distance using Hemi walker today. Pt will benefit from skilled PT to address strength and balance deficits to improve safety for transfers, reduce care giver burden, and allow return to PLOF and possible return home from Assisted living.   OBJECTIVE IMPAIRMENTS: Abnormal gait, cardiopulmonary status limiting activity, decreased activity tolerance, decreased balance, decreased cognition, decreased coordination, decreased endurance, decreased knowledge of use of DME, decreased mobility, difficulty walking,  decreased ROM, decreased strength, hypomobility, increased muscle spasms, impaired flexibility, impaired sensation, impaired tone, impaired UE functional use, improper body mechanics, and postural dysfunction.   ACTIVITY LIMITATIONS: carrying, lifting, bending, standing, squatting, stairs, transfers, bed mobility, continence, bathing, toileting, dressing, reach over head, hygiene/grooming, and locomotion level  PARTICIPATION LIMITATIONS: cleaning, interpersonal relationship, shopping, and community activity  PERSONAL FACTORS: 3+ comorbidities: HTN, chronic CVA, Afib, Lupus   are also affecting patient's functional outcome.   REHAB POTENTIAL: Fair chronic CVA with poor progress in prior interventions with this clinic  CLINICAL DECISION MAKING: Unstable/unpredictable  EVALUATION COMPLEXITY: High  PLAN:  PT FREQUENCY: 1-2x/week  PT DURATION: 12 weeks  PLANNED INTERVENTIONS: 97110-Therapeutic exercises, 97530- Therapeutic activity, [id number]- Neuromuscular re-education, 97535- Self Care, 16109- Manual therapy, [phone]- Gait training, [phone]- Orthotic Fit/training, [phone]- Aquatic Therapy, [phone]- Splinting, 97014- Electrical stimulation (unattended), [phone]- Electrical stimulation (manual), Patient/Family education, Balance training, Stair training, Joint mobilization, Vestibular training, Visual/preceptual remediation/compensation, Cognitive remediation, DME instructions, Wheelchair mobility training, Cryotherapy, and Moist heat  PLAN FOR NEXT SESSION:   Continue  standing balance. Gait. Transfers.    Lenda Kelp, PT 12/14/2023, 1:52 PM

## 2023-12-14 NOTE — Therapy (Signed)
 OUTPATIENT OCCUPATIONAL THERAPY NEURO TREATMENT NOTE  Patient Name: Lindsey [name] MRN: [id number] DOB:02/27/1956, 68 y.o., female Today's Date: 12/14/2023  PCP: Lindsey Morgan, MD REFERRING PROVIDER: Sula Soda, MD  END OF SESSION:  OT End of Session - 12/14/23 1427     Visit Number 7    Number of Visits 24    Date for OT Re-Evaluation 02/06/24    OT Start Time 0930    OT Stop Time 1015    OT Time Calculation (min) 45 min    Activity Tolerance Patient tolerated treatment well    Behavior During Therapy WFL for tasks assessed/performed            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) 09/24/2021   Cerebral embolism with cerebral infarction 09/23/2021   Pyelonephritis 09/19/2021   Pain due to onychomycosis of toenails of both feet 11/19/2020   Normocytic anemia 05/31/2020   Acute ischemic left MCA stroke (HCC) 04/30/2020   Right hemiplegia (HCC) 04/30/2020   Cerebrovascular accident (HCC) 04/21/2020   Atrial fibrillation with RVR (HCC) 04/21/2020   Essential hypertension 04/21/2020   Alcohol abuse 04/21/2020   ONSET DATE: 09/19/21  REFERRING DIAG: CVA  THERAPY DIAG:  Muscle weakness (generalized)  Other lack  of coordination  Rationale for Evaluation and Treatment: Rehabilitation  SUBJECTIVE:  SUBJECTIVE STATEMENT:  Pt.'s goal is to go home. Pt accompanied by: self, daughter  PERTINENT HISTORY: Pt. is a 68 y.o. 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 Pt. has Right sided weakness, and receptive, and expressive aphasia. Pt. has had a previous CVA with right sided weakness in July 2021. PMHx includes: AFib, Renal Artery thrombosis, situational depression, HTN, Pyelnephritis, Seizure activity, COnstipation, AKI. Vitamin D deficiency, and urinary incontinence   PRECAUTIONS: None  WEIGHT BEARING RESTRICTIONS: No  PAIN:  Are you having pain? No reports of pain  FALLS: Has patient fallen in last 6 months? Yes. Number of falls 1-ER visit  LIVING ENVIRONMENT: Pt. resides at [facility]. Pt. has very supportive family Stairs: One level Has following equipment at home:  Equipment provided through [employer]  PLOF: Needs assistance with ADLs  PATIENT GOALS:  To improve ROM/flexibility in the RUE 2/2 increased tightness, and to improve ADL functioning in order to towards returning home.  OBJECTIVE:  Note: Objective measures were completed at Evaluation unless otherwise noted.  HAND DOMINANCE: Right  ADLs: Overall ADLs: Assist is provided from the staff at [facility]. Transfers/ambulation related to ADLs: Eating: independent, with set-up using the left hand Grooming: Independent with set-up using the left hand UB Dressing: Increased assist required, Mod-MaxA doffing jacket  LB Dressing: Increased assist required Toileting:  Staff Assists with toileting transfers, and toilet hygiene care. Bathing: Staff assists with bathing Tub Shower transfers: Requires staff assist Equipment: TBD  IADLs: Shopping: N/A Light housekeeping: Provided by staff Meal Prep: 3 Meals a day provided. Pt. Eats in the main dining  room when family visits, otherwise Pt. Eats in her room.  Community mobility: Relies on family for transportation to therapy. Per daughter- Transportation is provided for services not offered at the facility. Medication management: Provided by the facility Financial management: Daughter manages Handwriting: TBD  MOBILITY STATUS: Hx of falls  FUNCTIONAL OUTCOME MEASURES:   UPPER EXTREMITY ROM:    Passive ROM Right Eval  Left Eval AROM WFL  Shoulder flexion 0(46)   Shoulder abduction 0(64)   Shoulder adduction    Shoulder extension    Shoulder internal rotation    Shoulder external rotation    Elbow flexion 0(98)   Elbow extension 0(full passive)   Wrist flexion 0(56)   Wrist extension -56(30)   Wrist ulnar deviation    Wrist radial deviation    Wrist pronation    Wrist supination    (Blank rows = not tested)  UPPER EXTREMITY MMT:     MMT Right eval Left Eval 5/5  Shoulder flexion 0/5   Shoulder abduction 0/5   Shoulder adduction    Shoulder extension    Shoulder internal rotation    Shoulder external rotation    Middle trapezius    Lower trapezius    Elbow flexion 0/5   Elbow extension 0/5   Wrist flexion    Wrist extension 0/5   Wrist ulnar deviation    Wrist radial deviation    Wrist pronation 0/5   Wrist supination 0/5   (Blank rows = not tested)  HAND FUNCTION: N/A  COORDINATION: N/A  SENSATION: Diminished  EDEMA: No edema noted in the right hand today  MUSCLE TONE: impaired  COGNITION: Overall cognitive status: Aphasia  VISION: Wears glasses, no change from baseline  PERCEPTION: TBD  PRAXIS: Impaired: Motor planning                                                                                                                           TREATMENT DATE:   Moist heat modality to the right shoulder, elbow, forearm, and wrist prior to Manual therapy, and there. Ex./ROM.    Manual therapy:   -Soft tissue massage to the scapular, and  right shoulder musculature. -Soft tissue mobilizations to promote radius on ulna motion needed for supination.  -Manual therapy was performed independent of, and in preparation for therapeutic Ex.     There. Ex:   -PROM through all joint ranges of the RUE for shoulder flexion, abduction, horizontal abduction, elbow flexion, extension, forearm supination, wrist extension, digit extension, and thumb abduction.  -Education was provided about self-PROM with hand-over-hand assist   Self-care:   - ModA doffing a light jacket - modA donning the jacket  with step-by-step cues  PATIENT EDUCATION: Education details: Functional transfer training Person educated: Patient Education method: Explanation, Demonstration, Tactile cues, and Verbal cues Education comprehension: verbalized understanding, returned demonstration, and verbal cues required, tactile cues  HOME EXERCISE PROGRAM: RUE self PROM  GOALS: Goals reviewed with patient? Yes  SHORT TERM GOALS: Target date: 12/27/2023    Pt. Will demonstrate supervision with HEPs for BUEs Baseline: Eval: No current HEPs  Goal status: INITIAL  LONG TERM GOALS: Target date: 02/07/2024    1.  Pt. Will improve right shoulder flexion by 20 degrees to assist with UE dressing Baseline: Eval: Right: 0(46) Goal status: INITIAL  2.  Pt. Will improve with shoulder abduction by 20 degrees to assist with underarm care, and hygiene Baseline: Eval: Right 0(64) Goal status: INITIAL  3.  Pt. Will improve right elbow flexion by 10 degrees to assist with hand to face patterns for gromming Baseline: Eval: Right 0(98) Goal status: INITIAL  4.  Pt. Will elicit active wrist extension, and digit extension in preparation for rubbing lotion into the let forearm, and hand  Baseline:  Goal status: INITIAL  5.  Pt. Will perform UE dressing skills with MinA Baseline: Eval: Mod-MaxA doffing a jacket Goal status: INITIAL  6. Pt. Will require minA toileting  transfers/ clothing management  Baseline: Eval: Pt. Requires increased assist for staff  Goal status: INITIAL   ASSESSMENT:  CLINICAL IMPRESSION:  Pt. continues to tolerate moist heat, manual therapy, and ROM well today. Pt. requires step-by-step cues, and assist to donn, and doff a jacket.  Pt continues to present with increased flexor tone, and tightness proximally through the scapular, and shoulder musculature, as well as through the wrist, and digits. Pt. required Mod assist donning, and doffing her jacket with cues to initiate sequencing the task, and cues for jacket direction, as well as reaching around the back of the neck to align the jacket into position before threading the LUE through the sleeve. Pt. continues to benefit from OT services to normalize tone in the dominant RUE, improve ROM for underarm hygiene care and UE dressing, as well as to improve toileting care, and clothing negotiation tasks.    PERFORMANCE DEFICITS: in functional skills including ADLs, IADLs, coordination, dexterity, proprioception, sensation, ROM, strength, pain, Fine motor control, Gross motor control, balance, body mechanics, decreased knowledge of use of DME, and UE functional use, cognitive skills including problem solving, and psychosocial skills including coping strategies, environmental adaptation, and routines and behaviors.   IMPAIRMENTS: are limiting patient from ADLs, IADLs, and leisure.   CO-MORBIDITIES: may have co-morbidities  that affects occupational performance. Patient will benefit from skilled OT to address above impairments and improve overall function.  MODIFICATION OR ASSISTANCE TO COMPLETE EVALUATION: Min-Moderate modification of tasks or assist with assess necessary to complete an evaluation.  OT OCCUPATIONAL PROFILE AND HISTORY: Detailed assessment: Review of records and additional review of physical, cognitive, psychosocial history related to current functional performance.  CLINICAL  DECISION MAKING: Moderate - several treatment options, min-mod task modification necessary  REHAB POTENTIAL: Good  EVALUATION COMPLEXITY: Moderate    PLAN:  OT FREQUENCY: 2x/week  OT DURATION: 12 weeks  PLANNED INTERVENTIONS: 97168 OT Re-evaluation, 97535 self care/ADL training, 82956 therapeutic exercise, 97530 therapeutic activity, 97112 neuromuscular re-education, 97140 manual therapy, 97018 paraffin, 21308 moist heat, 97010 cryotherapy, 97034 contrast bath, passive range of motion, coping strategies training, patient/family education, and DME and/or AE instructions  RECOMMENDED OTHER SERVICES: PT, ST  CONSULTED AND AGREED WITH PLAN OF CARE:  Patient  PLAN FOR NEXT SESSION: see above  Olegario Messier, MS, OTR/L   12/14/2023, 2:33 PM

## 2023-12-19 ENCOUNTER — Ambulatory Visit: Payer: Medicare Other | Admitting: Physical Therapy

## 2023-12-19 ENCOUNTER — Ambulatory Visit: Payer: Medicare Other | Admitting: Speech Pathology

## 2023-12-19 ENCOUNTER — Ambulatory Visit: Payer: Medicare Other

## 2023-12-19 DIAGNOSIS — R2681 Unsteadiness on feet: Secondary | ICD-10-CM

## 2023-12-19 DIAGNOSIS — R482 Apraxia: Secondary | ICD-10-CM

## 2023-12-19 DIAGNOSIS — R262 Difficulty in walking, not elsewhere classified: Secondary | ICD-10-CM

## 2023-12-19 DIAGNOSIS — R278 Other lack of coordination: Secondary | ICD-10-CM

## 2023-12-19 DIAGNOSIS — I63512 Cerebral infarction due to unspecified occlusion or stenosis of left middle cerebral artery: Secondary | ICD-10-CM

## 2023-12-19 DIAGNOSIS — R4701 Aphasia: Secondary | ICD-10-CM

## 2023-12-19 DIAGNOSIS — M6281 Muscle weakness (generalized): Secondary | ICD-10-CM

## 2023-12-19 DIAGNOSIS — R269 Unspecified abnormalities of gait and mobility: Secondary | ICD-10-CM

## 2023-12-19 NOTE — Therapy (Signed)
 OUTPATIENT PHYSICAL THERAPY NEURO TREATMENT   Patient Name: Lindsey [name] MRN: [id number] DOB:1955-12-17, 69 y.o., female Today's Date: 12/19/2023   PCP: Emogene Morgan, MD  REFERRING PROVIDER: Horton Chin, MD   END OF SESSION:   PT End of Session - 12/19/23 0933     Visit Number 9    Number of Visits 24    Date for PT Re-Evaluation 02/06/24    Progress Note Due on Visit 10    PT Start Time 0933    PT Stop Time 1013    PT Time Calculation (min) 40 min    Equipment Utilized During Treatment Gait belt    Activity Tolerance Patient tolerated treatment well    Behavior During Therapy WFL for tasks assessed/performed               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   REFERRING DIAG:  Diagnosis  I69.398,R25.2 (ICD-10-CM) - Spasticity as late effect of cerebrovascular accident (CVA)    THERAPY DIAG:  Muscle weakness (generalized)  Other lack of coordination  Abnormality of gait and mobility  Unsteadiness on feet  Difficulty in walking, not elsewhere classified  Rationale for Evaluation and Treatment: Rehabilitation  SUBJECTIVE:  SUBJECTIVE STATEMENT:  Pt reports she just wants wants "to go home" because she is "tired." Despite this, pt agreeable to participate in therapy session and is motivated to work hard and improve.   From EVAL: Daughter present for Evaluation to provide hx. Pt is familiar to this clinic and has been seen for multiple bouts PT since initial CVA in 2021. Pt d/c'ed from PT serviced in April of 2024 with plans to receive PT services through [facility]. Daughter reports that she was only receiving minimal therapy in facility. Reports at least 1 fall in the last 6 months and daughter asked staff to transfer pt with lift since fall.  Daughter would want pt to demonstrate improve safety with transfers to allow bil transfers with reduced use of rail to allow pt to return home for assisted living.  Unable to discern whether pt in SNF or assisted living care from hx.    Pt accompanied by: self and family member   PERTINENT HISTORY:  1. Acute Ischemic Left MCA Stroke:Right Hemiplegia: Ordered outpatient OT, PT, and SLP. She has had a HFU appointment with Neurology. Discussed that I can provide new scripts next month if needed. Communicated with her providers regarding positive reinforcement. She has made excellent gains in strength as well as cognition and speech!   -Provided with new disability placard.    2. Essential  Hypertension: BP is currently very elevated and discussed with patient that it would be unsafe for her to receive injections today as the injections can temporarily increase her pain, which can increase her blood pressure, and put her at risk for stroke. Discussed starting the medication Valsartan 40mg  daily and logging blood pressures daily at home and she is agreeable. Medications reviewed and she is taking Amlodipine 5mg  and lopressor.  Provided with a list of foods that will help lower her BP.  BP reviewed and improved at 136/82 -continue clonidine -Advised checking BP daily at home and logging results to bring into follow-up appointment with PCP and myself. -Reviewed BP meds today.  -Advised regarding healthy foods that can help lower blood pressure and provided with a list:  PAIN:  Are you having pain? No  PRECAUTIONS: Fall  RED FLAGS: None   WEIGHT BEARING RESTRICTIONS: No  FALLS: Has patient fallen in last 6 months? Yes. Number of falls 1  LIVING ENVIRONMENT: Lives with: lives with their family and lives in a skilled nursing facility Lives in: Other SNF  Stairs: No Has following equipment at home: Hemi walker and Wheelchair (manual)  PLOF: Requires assistive device for independence, Needs assistance with ADLs, Needs assistance with homemaking, and WC level currently   PATIENT GOALS: move better - walking or transferring with hemi walking   OBJECTIVE:  Note: Objective measures were completed at Evaluation unless otherwise noted.  DIAGNOSTIC FINDINGS:  Head CT:  IMPRESSION: 1. No acute intracranial abnormality. 2. No acute displaced fracture or traumatic listhesis of the cervical spine.  Cervical CT:  MPRESSION: 1. No acute intracranial abnormality. 2. No acute displaced fracture or traumatic listhesis of the cervical spine.  COGNITION: Overall cognitive status: History of cognitive impairments - at baseline   SENSATION: Light touch: Impaired  Proprioception:  Impaired  Able to detect depe pressure   COORDINATION: Increased tone on the RLE   EDEMA:  Mild RLE distal edema   MUSCLE TONE: RLE: Mild and Moderate  MUSCLE LENGTH: Hamstrings: grossly limited    POSTURE: rounded shoulders, forward head, and posterior bias   LOWER EXTREMITY  ROM:     Active  Right Eval Left Eval  Hip flexion  Limited to 5 deg beyond 90 in sitting  Knee flexion  90deg in sitting  Knee extension  Lacking 10 deg full extension  Ankle dorsiflexion  none  Ankle plantarflexion  none   (Blank rows = not tested)  LOWER EXTREMITY MMT:    MMT Right Eval Left Eval  Hip flexion  2+  Hip extension    Hip abduction  2  Hip adduction  3  Hip internal rotation    Hip external rotation    Knee flexion  2+  Knee extension  3+  Ankle dorsiflexion  0  Ankle plantarflexion  0  Ankle inversion    Ankle eversion    (Blank rows = not tested)  BED MOBILITY:  Sit to supine Mod A Supine to sit Mod A Rolling to Right Min A Rolling to Left Min A  TRANSFERS: Assistive device utilized: Hemi walker  Sit to stand: Mod A Stand to sit: Mod A Chair to chair: Mod A Floor:  unable to perform     CURB:  Level of Assistance: Total A Assistive device utilized:  rail Curb Comments: unable to perform   STAIRS: Unable to perform at eval   GAIT: Gait pattern:  non-functional constant posterior bias , step to pattern, decreased stance time- Right, Right hip hike, lateral hip instability, and lateral lean- Left Distance walked: 22ft Assistive device utilized: Hemi walker Level of assistance: Mod A Comments: moderate cues for sequencing of HW and gait pattern in turns   FUNCTIONAL TESTS:  5 times sit to stand: 48 sec with mod Assist from PT  Timed up and go (TUG): unable to perform  10 meter walk test: 6 ft with min-mod assist and HW.  FIST:  Function In Sitting Test (FIST)  (1/2 femur on surface; hips/knees flexed to 90deg)   - indicate bed or mat table / step  stool if used  SCORING KEY: 4 = Independent (completes task independently & successfully) 3 = Verbal Cues/Increased Time (completes task independently & successfully and only needs more time/cues) 2 = Upper Extremity Support (must use UE for support or assistance to complete successfully) 1 = Needs Assistance (unable to complete w/o physical assist; DOCUMENT LEVEL: min, mod, max) 0 = Dependent (requires complete physical assist; unable to complete successfully even w/ physical assist)  Randomly Administer Once Throughout Exam  4 - Anterior Nudge (superior sternum)  4 - Posterior Nudge (between scapular spines)  4 - Lateral Nudge (to dominant side at acromion)     4 - Static sitting (30 seconds)  4 - Sitting, shake 'no' (left and right)  4 - Sitting, eyes closed (30 seconds)   3 - Sitting, lift foot (dominant side, lift foot 1 inch twice)    4 - Pick up object from behind (object at midline, hands breadth posterior)  1 mod assist - Forward reach (use dominant arm, must complete full motion) 1 mod assist - Lateral reach (use dominant arm, clear opposite ischial tuberosity) 1 mod assist  - Pick up object from floor (from between feet)   1 mod assist - Posterior scooting (move backwards 2 inches)  1 min assist  - Anterior scooting (move forward 2 inches)  1 min assist  - Lateral scooting (move to dominant side 2 inches)    TOTAL = 37/56  Notes/comments: apraxia limiting success of scooting and reacting tasks.  MCD > 5 points MCID for IP  REHAB > 6 points      PATIENT SURVEYS:  To be completed                                                                                                                               TREATMENT DATE: 12/19/2023  Unless otherwise stated, min A was provided and gait belt donned in order to ensure pt safety throughout session.  R stand pivot w/c>EOM using L hemiwalker with skilled min A for balance and facilitating R LE positioning for lateral  step - noticed when standing on R LE to step L LE pt doesn't activate R glutes causing her to have excessive forward trunk flexion.  Repeated sit>stand from EOM with mirror feedback - noticed pt pushing backs of legs against mat, with posterior lean when rising - provided cue to lean forward and touch mirror to promote anterior trunk lean and then pt able to come to stand with light min A and decreased pushing of legs against mat - x8reps + x3 reps for carryover *pt requires manual facilitation to bring R LE back underneath BOS due to likely quad hypertonia with resistance to movement into knee flexion  Noticed R LE knee swelling but pt denies pain and no indications of pain noted during [place] interventions.  Performed seated R LE knee flexion PROM to manage tone in quadriceps x10reps. Attempted seated R LE knee flexion AROM with external target to tap cone behind her on ground, but pt unable to activate hamstring muscles, only able to relax quads to allow gravity to pull LE down towards ground.  Standing R LE NMR targeting increased R glute activation during stance via L LE pre-gait forward/backwards stepping - standing with L UE support near balance bar with pt only able to attempt stepping L LE 1x without UE support - requires skilled mod A to guard R hip during stance with total multimodal cuing to improve R hip extension, upright posture, and R weight shift.  On 2nd set progressed to being able to perform 3x reps without UE support!!  On 3rd set did all 8 repetitions without UE support!!  Pt improving in motor control and ability to activate R glutes during stance and improving in her confidence with this - pt was excited! *Pt likely also demonstrates lack of hip extension ROM with potential hip flexion contractures from prolonged sitting due to being at a wheelchair level full time.  Pt reports she has had her current manual wheelchair for >1 year - will need to further investigate this because  pt's current manual wheelchair set-up will not allow pt to perform independent wheelchair propulsion.  Attempted partial stand pivot transfers without an AD during session, but pt not successful and unsafe to complete this due to inability to step R LE and inability to stabilize R LE in order to step L LE.   PATIENT EDUCATION: Education details: POC.  HEP.  Person educated: Patient and Child(ren)  Education method: Explanation Education comprehension: verbalized understanding  HOME EXERCISE PROGRAM: Access Code: [email or web address] URL: [email or web address] Date: 12/05/2023 Prepared by: Grier Rocher  Exercises - Sit to Stand with Counter Support  - 1 x daily - 3-4 x weekly - 2 sets - 10 reps - Seated Long Arc Quad  - 1 x daily - 3-4 x weekly - 2 sets - 10 reps - 2 hold - Seated March  - 1 x daily - 3-4 x weekly - 2 sets - 10 reps - 2 hold - Standing Hip Abduction with Counter Support  - 1 x daily - 7 x weekly - 3 sets - 5 reps - Wide Stance with Counter Support  - 1 x daily - 7 x weekly - 3 sets - 2 reps - 30 hold - Seated Hip Abduction  - 1 x daily - 7 x weekly - 3 sets - 10 reps - Supine Bridge  - 1 x daily - 7 x weekly - 3 sets - 10 reps - Supine Short Arc Quad  - 1 x daily - 7 x weekly - 3 sets - 10 reps - Bent Knee Fallouts  - 1 x daily - 7 x weekly - 3 sets - 10 reps - Small Range Straight Leg Raise  - 1 x daily - 7 x weekly - 3 sets - 10 reps - Supine Heel Slide  - 1 x daily - 7 x weekly - 3 sets - 10 reps   GOALS: Goals reviewed with patient? Yes  SHORT TERM GOALS: Target date: 12/13/2023    Patient will be independent in home exercise program to improve strength/mobility for better functional independence with ADLs. Baseline: to be provided on visit 2  Goal status: INITIAL   LONG TERM GOALS: Target date: 02/07/2024     2.  Patient (> 31 years old) will complete five times sit to stand test by 15 seconds and only min assist indicating an increased LE strength  and improved balance. Baseline: 48 with mod assist from PT  Goal status: INITIAL  3.  Patient will increase FIST score by > 6 points to demonstrate decreased fall risk during functional activities Baseline: 37 Goal status: INITIAL  4.  Patient will complete 10 meter walk test to with HW in less than 1 minute with min assist as to improve gait speed for better community ambulation and to reduce fall risk. Baseline: 6 ft with HW and mod assist  Goal status: INITIAL  5.  Patient will perform stand pivot transfer to and from [facility] with HW and CGA for safety to allow safe transfer to toilet with daughter without use of rail on the wall. Baseline: mod assist and heavy use of rail.  Goal status: INITIAL  6.  Patient will improve bed mobility to min assist only to reduce care giver burden and improve safety of transfers  Baseline: mod-max assist Goal status: INITIAL  ASSESSMENT:  CLINICAL IMPRESSION:  Therapy session focused on improved sit>stand transfers and improved R LE stance phase control to improve stand pivot transfers. Pt continues to demo poor anterior weight shift of trunk when rising to stand and benefits from external target to improve this and decrease assistance level. Pt with noticeable tightness/hypertonia in R quadriceps with resistance to passive ROM into knee flexion as well as lack of hamstring activation - this limits pt's ability to place R foot in proper position prior to initiating transfers. Progressed to standing R LE NMR targeting improved stance control to increase  R glute activation to maintain upright posture when stepping L LE during transfers - pt progressed from heavy reliance on LUE support to performing 8 repetitions without UE support - with skilled mod A for balance. Pt will benefit from continued skilled PT to address strength and balance deficits to improve safety for transfers, reduce care giver burden, and allow return to PLOF and possible return home from  Assisted living.   OBJECTIVE IMPAIRMENTS: Abnormal gait, cardiopulmonary status limiting activity, decreased activity tolerance, decreased balance, decreased cognition, decreased coordination, decreased endurance, decreased knowledge of use of DME, decreased mobility, difficulty walking, decreased ROM, decreased strength, hypomobility, increased muscle spasms, impaired flexibility, impaired sensation, impaired tone, impaired UE functional use, improper body mechanics, and postural dysfunction.   ACTIVITY LIMITATIONS: carrying, lifting, bending, standing, squatting, stairs, transfers, bed mobility, continence, bathing, toileting, dressing, reach over head, hygiene/grooming, and locomotion level  PARTICIPATION LIMITATIONS: cleaning, interpersonal relationship, shopping, and community activity  PERSONAL FACTORS: 3+ comorbidities: HTN, chronic CVA, Afib, Lupus   are also affecting patient's functional outcome.   REHAB POTENTIAL: Fair chronic CVA with poor progress in prior interventions with this clinic  CLINICAL DECISION MAKING: Unstable/unpredictable  EVALUATION COMPLEXITY: High  PLAN:  PT FREQUENCY: 1-2x/week  PT DURATION: 12 weeks  PLANNED INTERVENTIONS: 97110-Therapeutic exercises, 97530- Therapeutic activity, [id number]- Neuromuscular re-education, 97535- Self Care, 16109- Manual therapy, [phone]- Gait training, [phone]- Orthotic Fit/training, [phone]- Aquatic Therapy, [phone]- Splinting, 97014- Electrical stimulation (unattended), [phone]- Electrical stimulation (manual), Patient/Family education, Balance training, Stair training, Joint mobilization, Vestibular training, Visual/preceptual remediation/compensation, Cognitive remediation, DME instructions, Wheelchair mobility training, Cryotherapy, and Moist heat  PLAN FOR NEXT SESSION:  - progress note - stretching of quads for tone management to improve knee flexion ROM to allow proper foot positioning underneath BOS during transfers - stretching hip  flexors to improve upright posture in standing -  R glute activation and strength during standing  Continue  standing balance. Gait. Transfers.    Casimiro Needle, PT, DPT, NCS, CSRS Physical Therapist - [place]  [facility]  11:19 AM 12/19/23

## 2023-12-19 NOTE — Therapy (Unsigned)
 OUTPATIENT SPEECH LANGUAGE PATHOLOGY  TREATMENT NOTE   Patient Name: Lindsey [name] MRN: [id number] DOB:March 18, 1956, 68 y.o., female Today's Date: 12/19/2023  PCP: Karie Fetch, MD REFERRING PROVIDER: Sula Soda, MD   End of Session - 12/19/23 1009     Visit Number 6    Number of Visits 17    Date for SLP Re-Evaluation 01/11/24    Authorization Type Medicare Part A/ Medicare Part B    Progress Note Due on Visit 10    SLP Start Time 0910    SLP Stop Time  0930    SLP Time Calculation (min) 20 min    Activity Tolerance Patient tolerated treatment well             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) 09/24/2021   Cerebral embolism with cerebral infarction 09/23/2021   Pyelonephritis 09/19/2021   Pain due to onychomycosis of toenails of both feet 11/19/2020   Normocytic anemia 05/31/2020   Acute ischemic left MCA stroke (HCC) 04/30/2020   Right hemiplegia (HCC) 04/30/2020   Cerebrovascular accident (HCC) 04/21/2020   Atrial fibrillation with RVR (HCC) 04/21/2020   Essential hypertension 04/21/2020   Alcohol abuse 04/21/2020    ONSET DATE: 04/30/2020 initial CVA; 10/25/2020  second CVA;  11/07/2023 date of  referral  REFERRING DIAG: R47.01 (ICD-10-CM) - Aphasia   THERAPY DIAG:  Aphasia  Apraxia  Rationale for Evaluation and Treatment Rehabilitation  SUBJECTIVE:   PERTINENT HISTORY and DIAGNOSTIC FINDINGS:   Patient is a 68 y.o. female with the following prior history:  MRI 04/21/2020 Brain: Acute infarct left MCA territory. Infarct involves the left frontal lobe, insula as well as the caudate and putamen. There are areas of hemorrhage within the caudate and putamen as well as in the left frontal lobe.  Scattered small white matter hyperintensities elsewhere compatible with chronic microvascular ischemia. Ventricle size normal. Negative for mass lesion.  Inpatient Rehabilitation ST Services: 04/2020 thru 05/2020 Outpatient ST services 08/2020 thru 08/2021 Pt discharged with Lingraphica Device; per chart using device to assist with communication  MRI 09/22/2021 IMPRESSION: 1. Acute moderate size Left AICA cerebellar infarct. Petechial hemorrhage but no malignant hemorrhagic transformation. Cytotoxic edema but no posterior fossa mass effect at this time.  2. Underlying extensive chronic left MCA territory encephalomalacia related to the 2021 infarct. Associated left brainstem Wallerian degeneration since last year. And small superimposed chronic Left PICA cerebellar infarcts, but also new since last year.   Inpatient Rehabilitation ST Services at [facility]: 10/02/2021 thru 10/21/2021 Outpatient ST Services [facility] 10/2021 thru 03/08/2022 Pt discharged d/t reaching maximal rehabilitation potential; "3/4 LTGs particially met due to limited caregiver availability"  Pt is currently residing at  a SNF [facility]). Additional medical history includes alcoholism,  AFib, Renal Artery thrombosis, situational depression, HTN, Pyelnephritis, Seizure activity, Constipation, AKI. Vitamin D deficiency, and urinary incontinence.   PAIN:  Are you having pain? No  FALLS: Has patient fallen  in last 6 months?  See PT evaluation for details  LIVING ENVIRONMENT: Lives with: lives with their family and lives in a skilled nursing facility   PLOF:  Level of assistance: Needed assistance with ADLs, Needed assistance with IADLS   PATIENT GOALS    pt unable to state - her daughter reports she would like for pt to be able to "communicate basics, return to abilities before second stroke (09/2021)"  SUBJECTIVE STATEMENT: Pt arrived to session brought by her facility Pt accompanied by: family member;   OBJECTIVE:   TODAY'S TREATMENT:     Skilled treatment session focused on her communication goals. SLP facilitated the session by providing the following interventions:  Given that pt had indicated desire for some homework, SLP provided activities from the [facility] vocabulary book. Specifically pages 122 thru 127. With verbal model pt able to repeat names of basic objects with > 80% intelligibility and match picture of objects to printed word.    PATIENT EDUCATION: Education details: use of low Economist  Person educated: Patient and Child(ren) Education method: Explanation Education comprehension: needs further education  HOME EXERCISE PROGRAM:   Worksheets provided to complete when her daughter visits her    GOALS:  Goals reviewed with patient? Yes  SHORT TERM GOALS: Target date: 10 sessions  Given verbal or visual prompt, pt will correctly ID written word/object/icon in field of 4-6 to communicate wants/needs with 75% accuracy given occasional mod A over 2 sessions  Baseline: Goal status: INITIAL   LONG TERM GOALS: Target date: 01/11/2024  Pt will utilize multimodal communication to express basic wants/needs as reported by pt and her daughter in 3 out of 5 opportunities.  Baseline:  Goal status: INITIAL   ASSESSMENT:  CLINICAL IMPRESSION: Patient is a 68 y.o. right handed female who was seen today for a speech language treatment d/t history of Left MCA  CVA. 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 Her verbal language Is halting and while she does produce some islands of improved speech intelligibility during naming tasks and spontaneous responses, she speech contains neologisms with evidence of word finding difficulty. Pt's daughter reports that pt doesn't use her Lingraphica device "as much as she should" and it is only available to her during weekends at home for fear that it might be lost at pt's facility.   Pt appeared to enjoy doing some worksheets and wanted to send some home with her for continued practice.  See the above treatment note for details.   OBJECTIVE IMPAIRMENTS include attention, aphasia, apraxia, and dysarthria. These impairments are limiting patient from effectively communicating at home and in community. Factors affecting potential to achieve goals and functional outcome are ability to learn/carryover information, co-morbidities, cooperation/participation level, medical prognosis, previous level of function, severity of impairments, family/community support, and time post onset . Patient will benefit from skilled SLP services to address above impairments and improve overall function.  REHAB POTENTIAL: Fair time post onset, multiple courses of ST services, decreased carryover of therapy concepts during previous sessions; pt discharged from previous course of ST services (2023) requiring moderate assistance for communication  PLAN: SLP FREQUENCY: 1-2x/week  SLP DURATION: 8  weeks  PLANNED INTERVENTIONS: Language facilitation, Internal/external aids, Functional tasks, Multimodal communication approach, SLP instruction and feedback, Compensatory strategies, Patient/family education, and [id number] Treatment of speech (30 or 45 min)     Deadrian Toya B. Dreama Saa, M.S., CCC-SLP, Tree surgeon Certified Brain Injury  Specialist [facility]  [facility] Rehabilitation Services Office [phone] Ascom [phone] Fax [phone]

## 2023-12-21 ENCOUNTER — Ambulatory Visit: Payer: Medicare Other | Admitting: Speech Pathology

## 2023-12-21 ENCOUNTER — Ambulatory Visit: Payer: Medicare Other | Admitting: Occupational Therapy

## 2023-12-21 ENCOUNTER — Ambulatory Visit: Payer: Medicare Other | Admitting: Physical Therapy

## 2023-12-23 NOTE — Therapy (Signed)
 OUTPATIENT OCCUPATIONAL THERAPY NEURO TREATMENT NOTE  Patient Name: Lindsey [name] MRN: [id number] DOB:[birth year], 68 y.o., female Today's Date: 12/23/2023  PCP: Emogene Morgan, MD REFERRING PROVIDER: Sula Soda, MD  END OF SESSION:  OT End of Session - 12/23/23 1922     Visit Number 8    Number of Visits 24    Date for OT Re-Evaluation 02/06/24    OT Start Time 1015    OT Stop Time 1100    OT Time Calculation (min) 45 min    Activity Tolerance Patient tolerated treatment well    Behavior During Therapy WFL for tasks assessed/performed            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) 09/24/2021   Cerebral embolism with cerebral infarction 09/23/2021   Pyelonephritis 09/19/2021   Pain due to onychomycosis of toenails of both feet 11/19/2020   Normocytic anemia 05/31/2020   Acute ischemic left MCA stroke (HCC) 04/30/2020   Right hemiplegia (HCC) 04/30/2020   Cerebrovascular accident (HCC) 04/21/2020   Atrial fibrillation with RVR (HCC) 04/21/2020   Essential hypertension 04/21/2020   Alcohol abuse 04/21/2020   ONSET DATE: 09/19/21  REFERRING DIAG: CVA  THERAPY DIAG:  Muscle weakness (generalized)  Other lack  of coordination  Acute ischemic left MCA stroke (HCC)  Rationale for Evaluation and Treatment: Rehabilitation  SUBJECTIVE:  SUBJECTIVE STATEMENT: Daughter reports it's getting easier to assist pt in and out of the car. Pt accompanied by: self, daughter  PERTINENT HISTORY: Pt. is a 68 y.o. 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 Pt. has Right sided weakness, and receptive, and expressive aphasia. Pt. has had a previous CVA with right sided weakness in July 2021. PMHx includes: AFib, Renal Artery thrombosis, situational depression, HTN, Pyelnephritis, Seizure activity, COnstipation, AKI. Vitamin D deficiency, and urinary incontinence   PRECAUTIONS: None  WEIGHT BEARING RESTRICTIONS: No  PAIN:  Are you having pain? No reports of pain  FALLS: Has patient fallen in last 6 months? Yes. Number of falls 1-ER visit  LIVING ENVIRONMENT: Pt. resides at [facility]. Pt. has very supportive family Stairs: One level Has following equipment at home:  Equipment provided through [employer]  PLOF: Needs assistance with ADLs  PATIENT GOALS:  To improve ROM/flexibility in the RUE 2/2 increased tightness, and to improve ADL functioning in order to towards returning home.  OBJECTIVE:  Note: Objective measures were completed at Evaluation unless otherwise noted.  HAND DOMINANCE: Right  ADLs: Overall ADLs: Assist is provided from the staff at [facility]. Transfers/ambulation related to ADLs: Eating: independent, with set-up using the left hand Grooming: Independent  with set-up using the left hand UB Dressing: Increased assist required, Mod-MaxA doffing jacket LB Dressing: Increased assist required Toileting:  Staff Assists with toileting transfers, and toilet hygiene care. Bathing: Staff assists with bathing Tub Shower transfers: Requires staff assist Equipment: TBD  IADLs: Shopping: N/A Light  housekeeping: Provided by staff Meal Prep: 3 Meals a day provided. Pt. Eats in the main dining room when family visits, otherwise Pt. Eats in her room.  Community mobility: Relies on family for transportation to therapy. Per daughter- Transportation is provided for services not offered at the facility. Medication management: Provided by the facility Financial management: Daughter manages Handwriting: TBD  MOBILITY STATUS: Hx of falls  FUNCTIONAL OUTCOME MEASURES:   UPPER EXTREMITY ROM:    Passive ROM Right Eval  Left Eval AROM WFL  Shoulder flexion 0(46)   Shoulder abduction 0(64)   Shoulder adduction    Shoulder extension    Shoulder internal rotation    Shoulder external rotation    Elbow flexion 0(98)   Elbow extension 0(full passive)   Wrist flexion 0(56)   Wrist extension -56(30)   Wrist ulnar deviation    Wrist radial deviation    Wrist pronation    Wrist supination    (Blank rows = not tested)  UPPER EXTREMITY MMT:     MMT Right eval Left Eval 5/5  Shoulder flexion 0/5   Shoulder abduction 0/5   Shoulder adduction    Shoulder extension    Shoulder internal rotation    Shoulder external rotation    Middle trapezius    Lower trapezius    Elbow flexion 0/5   Elbow extension 0/5   Wrist flexion    Wrist extension 0/5   Wrist ulnar deviation    Wrist radial deviation    Wrist pronation 0/5   Wrist supination 0/5   (Blank rows = not tested)  HAND FUNCTION: N/A  COORDINATION: N/A  SENSATION: Diminished  EDEMA: No edema noted in the right hand today  MUSCLE TONE: impaired  COGNITION: Overall cognitive status: Aphasia  VISION: Wears glasses, no change from baseline  PERCEPTION: TBD  PRAXIS: Impaired: Motor planning                                                                                                                           TREATMENT DATE: 12/19/23 Moist heat modality to the R shoulder for muscle relaxation, with simultaneous  passive stretching noted below.   Therapeutic Exercise: -PROM through all joint ranges of the RUE for shoulder flexion, abduction, horizontal abduction, elbow flexion, extension, forearm supination, wrist extension, digit extension, and thumb abduction.  -Continued review of self PROM techniques to the RUE   Self Care: -STS trials with vc and tactile cues for anterior weight shift with rise; min A, progressing to min guard -wc<>BSC transfers (step pivot) using hemi walker; min A.  Vc and tactile cues for lateral weight shift to ease ability to step R foot during step  pivot transfers.   PATIENT EDUCATION: Education details: Functional transfer training Person educated: Patient Education method: Explanation, Demonstration, Tactile cues, and Verbal cues Education comprehension: verbalized understanding, returned demonstration, and verbal cues required, tactile cues  HOME EXERCISE PROGRAM: RUE self PROM  GOALS: Goals reviewed with patient? Yes  SHORT TERM GOALS: Target date: 12/27/2023    Pt. Will demonstrate supervision with HEPs for BUEs Baseline: Eval: No current HEPs  Goal status: INITIAL  LONG TERM GOALS: Target date: 02/07/2024    1.  Pt. Will improve right shoulder flexion by 20 degrees to assist with UE dressing Baseline: Eval: Right: 0(46) Goal status: INITIAL  2.  Pt. Will improve with shoulder abduction by 20 degrees to assist with underarm care, and hygiene Baseline: Eval: Right 0(64) Goal status: INITIAL  3.  Pt. Will improve right elbow flexion by 10 degrees to assist with hand to face patterns for gromming Baseline: Eval: Right 0(98) Goal status: INITIAL  4.  Pt. Will elicit active wrist extension, and digit extension in preparation for rubbing lotion into the let forearm, and hand  Baseline:  Goal status: INITIAL  5.  Pt. Will perform UE dressing skills with MinA Baseline: Eval: Mod-MaxA doffing a jacket Goal status: INITIAL  6. Pt. Will require minA  toileting transfers/ clothing management  Baseline: Eval: Pt. Requires increased assist for staff  Goal status: INITIAL   ASSESSMENT:  CLINICAL IMPRESSION: Pt. continues to tolerate moist heat, therapeutic  exercises and ADL training well today. Pt is making steady gains with STS and step pivot wc<>BSC transfers.  Improving anterior weight shift, allowing pt to perform STS with min A-min guard, and step pivot with min A with vc and tactile cues for lateral weight shifting to ease ability to step R foot forward/backward in prep for transfer.  Daughter reports that pt comes home with her on the weekends, and she feels it's getting easier to assist pt in/out of the car.  Pt. continues to benefit from OT services to normalize tone in the dominant RUE, improve ROM for underarm hygiene care and UE dressing, as well as to improve toileting care, toilet transfers, and clothing negotiation tasks.    PERFORMANCE DEFICITS: in functional skills including ADLs, IADLs, coordination, dexterity, proprioception, sensation, ROM, strength, pain, Fine motor control, Gross motor control, balance, body mechanics, decreased knowledge of use of DME, and UE functional use, cognitive skills including problem solving, and psychosocial skills including coping strategies, environmental adaptation, and routines and behaviors.   IMPAIRMENTS: are limiting patient from ADLs, IADLs, and leisure.   CO-MORBIDITIES: may have co-morbidities  that affects occupational performance. Patient will benefit from skilled OT to address above impairments and improve overall function.  MODIFICATION OR ASSISTANCE TO COMPLETE EVALUATION: Min-Moderate modification of tasks or assist with assess necessary to complete an evaluation.  OT OCCUPATIONAL PROFILE AND HISTORY: Detailed assessment: Review of records and additional review of physical, cognitive, psychosocial history related to current functional performance.  CLINICAL DECISION MAKING:  Moderate - several treatment options, min-mod task modification necessary  REHAB POTENTIAL: Good  EVALUATION COMPLEXITY: Moderate    PLAN:  OT FREQUENCY: 2x/week  OT DURATION: 12 weeks  PLANNED INTERVENTIONS: 97168 OT Re-evaluation, 97535 self care/ADL training, 16109 therapeutic exercise, 97530 therapeutic activity, 97112 neuromuscular re-education, 97140 manual therapy, 97018 paraffin, 60454 moist heat, 97010 cryotherapy, 97034 contrast bath, passive range of motion, coping strategies training, patient/family education, and DME and/or AE instructions  RECOMMENDED OTHER SERVICES: PT, ST  CONSULTED AND AGREED WITH PLAN OF CARE:  Patient  PLAN FOR NEXT SESSION: see above  Danelle Earthly, MS, OTR/L   12/23/2023, 7:23 PM

## 2023-12-26 ENCOUNTER — Ambulatory Visit: Payer: Medicare Other

## 2023-12-26 ENCOUNTER — Ambulatory Visit: Payer: Medicare Other | Admitting: Physical Therapy

## 2023-12-26 ENCOUNTER — Ambulatory Visit: Payer: Medicare Other | Admitting: Speech Pathology

## 2023-12-28 ENCOUNTER — Ambulatory Visit: Payer: Medicare Other | Admitting: Speech Pathology

## 2023-12-28 ENCOUNTER — Ambulatory Visit: Payer: Medicare Other

## 2023-12-28 DIAGNOSIS — I63512 Cerebral infarction due to unspecified occlusion or stenosis of left middle cerebral artery: Secondary | ICD-10-CM

## 2023-12-28 DIAGNOSIS — R278 Other lack of coordination: Secondary | ICD-10-CM

## 2023-12-28 DIAGNOSIS — R262 Difficulty in walking, not elsewhere classified: Secondary | ICD-10-CM

## 2023-12-28 DIAGNOSIS — R269 Unspecified abnormalities of gait and mobility: Secondary | ICD-10-CM

## 2023-12-28 DIAGNOSIS — R4701 Aphasia: Secondary | ICD-10-CM | POA: Diagnosis not present

## 2023-12-28 DIAGNOSIS — M6281 Muscle weakness (generalized): Secondary | ICD-10-CM

## 2023-12-28 DIAGNOSIS — R482 Apraxia: Secondary | ICD-10-CM

## 2023-12-28 DIAGNOSIS — R2681 Unsteadiness on feet: Secondary | ICD-10-CM

## 2023-12-28 NOTE — Therapy (Signed)
 OUTPATIENT SPEECH LANGUAGE PATHOLOGY  TREATMENT NOTE   Patient Name: Lindsey [name] MRN: [id number] DOB:1956/03/31, 68 y.o., female Today's Date: 12/28/2023  PCP: Karie Fetch, MD REFERRING PROVIDER: Sula Soda, MD   End of Session - 12/28/23 1800     Visit Number 7    Number of Visits 17    Date for SLP Re-Evaluation 01/11/24    Authorization Type Medicare Part A/ Medicare Part B    Progress Note Due on Visit 10    SLP Start Time 0930    SLP Stop Time  1015    SLP Time Calculation (min) 45 min    Activity Tolerance Patient tolerated treatment well             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) 09/24/2021   Cerebral embolism with cerebral infarction 09/23/2021   Pyelonephritis 09/19/2021   Pain due to onychomycosis of toenails of both feet 11/19/2020   Normocytic anemia 05/31/2020   Acute ischemic left MCA stroke (HCC) 04/30/2020   Right hemiplegia (HCC) 04/30/2020   Cerebrovascular accident (HCC) 04/21/2020   Atrial fibrillation with RVR (HCC) 04/21/2020   Essential hypertension 04/21/2020   Alcohol abuse 04/21/2020    ONSET DATE: 04/30/2020 initial CVA; 10/25/2020  second CVA;  11/07/2023 date of  referral  REFERRING DIAG: R47.01 (ICD-10-CM) - Aphasia   THERAPY DIAG:  Aphasia  Apraxia  Rationale for Evaluation and Treatment Rehabilitation  SUBJECTIVE:   PERTINENT HISTORY and DIAGNOSTIC FINDINGS:   Patient is a 68 y.o. female with the following prior history:  MRI 04/21/2020 Brain: Acute infarct left MCA territory. Infarct involves the left frontal lobe, insula as well as the caudate and putamen. There are areas of hemorrhage within the caudate and putamen as well as in the left frontal lobe.  Scattered small white matter hyperintensities elsewhere compatible with chronic microvascular ischemia. Ventricle size normal. Negative for mass lesion.  Inpatient Rehabilitation ST Services: 04/2020 thru 05/2020 Outpatient ST services 08/2020 thru 08/2021 Pt discharged with Lingraphica Device; per chart using device to assist with communication  MRI 09/22/2021 IMPRESSION: 1. Acute moderate size Left AICA cerebellar infarct. Petechial hemorrhage but no malignant hemorrhagic transformation. Cytotoxic edema but no posterior fossa mass effect at this time.  2. Underlying extensive chronic left MCA territory encephalomalacia related to the 2021 infarct. Associated left brainstem Wallerian degeneration since last year. And small superimposed chronic Left PICA cerebellar infarcts, but also new since last year.   Inpatient Rehabilitation ST Services at [facility]: 10/02/2021 thru 10/21/2021 Outpatient ST Services [facility] 10/2021 thru 03/08/2022 Pt discharged d/t reaching maximal rehabilitation potential; "3/4 LTGs particially met due to limited caregiver availability"  Pt is currently residing at  a SNF [facility] Additional medical history includes alcoholism,  AFib, Renal Artery thrombosis, situational depression, HTN, Pyelnephritis, Seizure activity, Constipation, AKI. Vitamin D deficiency, and urinary incontinence.   PAIN:  Are you having pain? No  FALLS: Has patient fallen  in last 6 months?  See PT evaluation for details  LIVING ENVIRONMENT: Lives with: lives with their family and lives in a skilled nursing facility   PLOF:  Level of assistance: Needed assistance with ADLs, Needed assistance with IADLS   PATIENT GOALS    pt unable to state - her daughter reports she would like for pt to be able to "communicate basics, return to abilities before second stroke (09/2021)"  SUBJECTIVE STATEMENT: Pt arrived eager with her daughter stopping by session to bring her TouchTalk Device  Pt accompanied by: alone  OBJECTIVE:   TODAY'S TREATMENT:     Skilled treatment session focused on her communication goals. SLP facilitated the session by providing the following interventions:  Pt states that she enjoys having sheets to work on when her daughter visits. While she was eager to work on paper sheets, she appeared more excited when SLP brought up TalkPath Therapy on her device. Pt voiced greater enjoyment and accuracy when completing Matching Level 1 (100%) and Level 2 (90%), Word ID Level 1 with moderate cues for monitoring physically touching device.   TalkPath Therapy icon moved to first icon for easier access.    PATIENT EDUCATION: Education details: use of low Economist  Person educated: Patient and Child(ren) Education method: Explanation Education comprehension: needs further education  HOME EXERCISE PROGRAM:   Worksheets provided to complete when her daughter visits her    GOALS:  Goals reviewed with patient? Yes  SHORT TERM GOALS: Target date: 10 sessions  Given verbal or visual prompt, pt will correctly ID written word/object/icon in field of 4-6 to communicate wants/needs with 75% accuracy given occasional mod A over 2 sessions  Baseline: Goal status: INITIAL   LONG TERM GOALS: Target date: 01/11/2024  Pt will utilize multimodal communication to express basic wants/needs as reported by pt and her daughter in 3 out of 5  opportunities.  Baseline:  Goal status: INITIAL   ASSESSMENT:  CLINICAL IMPRESSION: Patient is a 68 y.o. right handed female who was seen today for a speech language treatment d/t history of Left MCA CVA. 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 Her verbal language Is halting and while she does produce some islands of improved speech intelligibility during naming tasks and spontaneous responses, she speech contains neologisms with evidence of word finding difficulty. Pt's daughter reports that pt doesn't use her Lingraphica device "as much as she should" and it is only available to her during weekends at home for fear that it might be lost at pt's facility.   Pt appeared to really enjoy using TouchTalk for accessing TalkPath Therapy exercises.  See the above treatment note for details.   OBJECTIVE IMPAIRMENTS include attention, aphasia, apraxia, and dysarthria. These impairments are limiting patient from effectively communicating at home and in community. Factors affecting potential to achieve goals and functional outcome are ability to learn/carryover information, co-morbidities, cooperation/participation level, medical prognosis, previous level of function, severity of impairments, family/community support, and time post onset . Patient will benefit from skilled SLP services to address above impairments and improve overall function.  REHAB POTENTIAL: Fair time post onset, multiple courses of ST services, decreased  carryover of therapy concepts during previous sessions; pt discharged from previous course of ST services (2023) requiring moderate assistance for communication  PLAN: SLP FREQUENCY: 1-2x/week  SLP DURATION: 8 weeks  PLANNED INTERVENTIONS: Language facilitation, Internal/external aids, Functional tasks, Multimodal communication approach, SLP instruction and feedback, Compensatory  strategies, Patient/family education, and 10272 Treatment of speech (30 or 45 min)     Dantae Meunier B. Dreama Saa, M.S., CCC-SLP, Tree surgeon Certified Brain Injury Specialist [facility]  [facility] Rehabilitation Services Office [phone] Ascom [phone] Fax [phone]

## 2023-12-28 NOTE — Therapy (Signed)
 OUTPATIENT OCCUPATIONAL THERAPY NEURO TREATMENT NOTE  Patient Name: Lindsey [name] MRN: [id number] DOB:11-09-1955, 68 y.o., female Today's Date: 12/31/2023  PCP: Emogene Morgan, MD REFERRING PROVIDER: Sula Soda, MD   END OF SESSION:   OT End of Session - 12/28/23      Visit Number 9     Number of Visits 24     Date for OT Re-Evaluation 02/06/24     OT Start Time 1100    OT Stop Time 1145    OT Time Calculation (min) 45 min     Activity Tolerance Patient tolerated treatment well     Behavior During Therapy WFL for tasks assessed/performed       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) 09/24/2021   Cerebral embolism with cerebral infarction 09/23/2021   Pyelonephritis 09/19/2021   Pain due to onychomycosis of toenails of both feet 11/19/2020   Normocytic anemia 05/31/2020   Acute ischemic left MCA stroke (HCC) 04/30/2020   Right hemiplegia (HCC) 04/30/2020   Cerebrovascular accident (HCC) 04/21/2020   Atrial fibrillation with RVR (HCC) 04/21/2020   Essential hypertension 04/21/2020   Alcohol abuse 04/21/2020   ONSET DATE: 09/19/21  REFERRING DIAG: CVA  THERAPY DIAG:  Muscle weakness (generalized)  Other lack of  coordination  Acute ischemic left MCA stroke (HCC)  Rationale for Evaluation and Treatment: Rehabilitation  SUBJECTIVE:  SUBJECTIVE STATEMENT: Pt acknowledged needing to use the bathroom following PT session. Pt accompanied by: self, daughter  PERTINENT HISTORY: Pt. is a 68 y.o. 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 Pt. has Right sided weakness, and receptive, and expressive aphasia. Pt. has had a previous CVA with right sided weakness in July 2021. PMHx includes: AFib, Renal Artery thrombosis, situational depression, HTN, Pyelnephritis, Seizure activity, COnstipation, AKI. Vitamin D deficiency, and urinary incontinence   PRECAUTIONS: None  WEIGHT BEARING RESTRICTIONS: No  PAIN:  Are you having pain? No reports of pain  FALLS: Has patient fallen in last 6 months? Yes. Number of falls 1-ER visit  LIVING ENVIRONMENT: Pt. resides at [facility]. Pt. has very supportive family Stairs: One level Has following equipment at home:  Equipment provided through [employer]  PLOF: Needs assistance with ADLs  PATIENT GOALS:  To improve ROM/flexibility in the RUE 2/2 increased tightness, and to improve ADL functioning in order to towards returning home.  OBJECTIVE:  Note: Objective measures were completed at Evaluation unless otherwise noted.  HAND DOMINANCE: Right  ADLs: Overall ADLs: Assist is provided from the staff at [facility] Transfers/ambulation related to ADLs: Eating: independent, with set-up using the left hand Grooming: Independent with set-up  using the left hand UB Dressing: Increased assist required, Mod-MaxA doffing jacket LB Dressing: Increased assist required Toileting:  Staff Assists with toileting transfers, and toilet hygiene care. Bathing: Staff assists with bathing Tub Shower transfers: Requires staff assist Equipment: TBD  IADLs: Shopping: N/A Light housekeeping:  Provided by staff Meal Prep: 3 Meals a day provided. Pt. Eats in the main dining room when family visits, otherwise Pt. Eats in her room.  Community mobility: Relies on family for transportation to therapy. Per daughter- Transportation is provided for services not offered at the facility. Medication management: Provided by the facility Financial management: Daughter manages Handwriting: TBD  MOBILITY STATUS: Hx of falls  FUNCTIONAL OUTCOME MEASURES:   UPPER EXTREMITY ROM:    Passive ROM Right Eval  Left Eval AROM WFL  Shoulder flexion 0(46)   Shoulder abduction 0(64)   Shoulder adduction    Shoulder extension    Shoulder internal rotation    Shoulder external rotation    Elbow flexion 0(98)   Elbow extension 0(full passive)   Wrist flexion 0(56)   Wrist extension -56(30)   Wrist ulnar deviation    Wrist radial deviation    Wrist pronation    Wrist supination    (Blank rows = not tested)  UPPER EXTREMITY MMT:     MMT Right eval Left Eval 5/5  Shoulder flexion 0/5   Shoulder abduction 0/5   Shoulder adduction    Shoulder extension    Shoulder internal rotation    Shoulder external rotation    Middle trapezius    Lower trapezius    Elbow flexion 0/5   Elbow extension 0/5   Wrist flexion    Wrist extension 0/5   Wrist ulnar deviation    Wrist radial deviation    Wrist pronation 0/5   Wrist supination 0/5   (Blank rows = not tested)  HAND FUNCTION: N/A  COORDINATION: N/A  SENSATION: Diminished  EDEMA: No edema noted in the right hand today  MUSCLE TONE: impaired  COGNITION: Overall cognitive status: Aphasia  VISION: Wears glasses, no change from baseline  PERCEPTION: TBD  PRAXIS: Impaired: Motor planning                                                                                                                           TREATMENT DATE: 12/28/23 Moist heat modality to the R shoulder for muscle relaxation, with simultaneous passive  stretching noted below.   Therapeutic Exercise: -PROM through all joint ranges of the RUE for shoulder flexion, abduction, horizontal abduction, elbow flexion, extension, forearm supination, wrist extension, digit extension, and thumb abduction.  -Continued review of self PROM techniques to the RUE   Self Care: -Wc>toilet transfer (step pivot) using hemi walker; mod A.  Vc and tactile cues for lateral weight shift to ease ability to step R foot during step pivot transfers.  Utilized grab bar only for transfer from toilet back to wc d/t difficulty with hemiwalker and pt feeling  more support from grab bar. -Set up for frontal peri care -Dep for diaper removal and donning new, hiking and lowering pants.  Pt stood at grab bar for clothing management with intermittent vc for maintaining erect posture; CGA-min A for standing.  PATIENT EDUCATION: Education details: Functional transfer training Person educated: Patient Education method: Explanation, Demonstration, Tactile cues, and Verbal cues Education comprehension: verbalized understanding, returned demonstration, and verbal cues required, tactile cues  HOME EXERCISE PROGRAM: RUE self PROM  GOALS: Goals reviewed with patient? Yes  SHORT TERM GOALS: Target date: 12/27/2023    Pt. Will demonstrate supervision with HEPs for BUEs Baseline: Eval: No current HEPs  Goal status: INITIAL  LONG TERM GOALS: Target date: 02/07/2024    1.  Pt. Will improve right shoulder flexion by 20 degrees to assist with UE dressing Baseline: Eval: Right: 0(46) Goal status: INITIAL  2.  Pt. Will improve with shoulder abduction by 20 degrees to assist with underarm care, and hygiene Baseline: Eval: Right 0(64) Goal status: INITIAL  3.  Pt. Will improve right elbow flexion by 10 degrees to assist with hand to face patterns for gromming Baseline: Eval: Right 0(98) Goal status: INITIAL  4.  Pt. Will elicit active wrist extension, and digit extension in  preparation for rubbing lotion into the let forearm, and hand  Baseline:  Goal status: INITIAL  5.  Pt. Will perform UE dressing skills with MinA Baseline: Eval: Mod-MaxA doffing a jacket Goal status: INITIAL  6. Pt. Will require minA toileting transfers/ clothing management  Baseline: Eval: Pt. Requires increased assist for staff  Goal status: INITIAL   ASSESSMENT:  CLINICAL IMPRESSION: Attempted use of hemiwalker for transfer from wc to toilet this date, but utilized grab bar only for transfer from toilet back to wc d/t difficulty with hemiwalker and pt feeling more support from grab bar.  Pt required mod A for transfer to toilet with hemiwalker, and typically min A wc > toilet when using grab bar.  Still mod A sit to stand from standard height toilet using grab bar.  Pt improving with self ROM to the RUE.  Pt required mod tactile cues for RUE positioning to minimize tone while performing self passive elbow flexion.  Pt. continues to benefit from OT services to normalize tone in the dominant RUE, improve ROM for underarm hygiene care and UE dressing, as well as to improve toileting care, toilet transfers, and clothing negotiation tasks.    PERFORMANCE DEFICITS: in functional skills including ADLs, IADLs, coordination, dexterity, proprioception, sensation, ROM, strength, pain, Fine motor control, Gross motor control, balance, body mechanics, decreased knowledge of use of DME, and UE functional use, cognitive skills including problem solving, and psychosocial skills including coping strategies, environmental adaptation, and routines and behaviors.   IMPAIRMENTS: are limiting patient from ADLs, IADLs, and leisure.   CO-MORBIDITIES: may have co-morbidities  that affects occupational performance. Patient will benefit from skilled OT to address above impairments and improve overall function.  MODIFICATION OR ASSISTANCE TO COMPLETE EVALUATION: Min-Moderate modification of tasks or assist with  assess necessary to complete an evaluation.  OT OCCUPATIONAL PROFILE AND HISTORY: Detailed assessment: Review of records and additional review of physical, cognitive, psychosocial history related to current functional performance.  CLINICAL DECISION MAKING: Moderate - several treatment options, min-mod task modification necessary  REHAB POTENTIAL: Good  EVALUATION COMPLEXITY: Moderate    PLAN:  OT FREQUENCY: 2x/week  OT DURATION: 12 weeks  PLANNED INTERVENTIONS: 97168 OT Re-evaluation, 97535 self care/ADL training, 13086 therapeutic exercise, 97530  therapeutic activity, 97112 neuromuscular re-education, 97140 manual therapy, 97018 paraffin, 16109 moist heat, 97010 cryotherapy, 97034 contrast bath, passive range of motion, coping strategies training, patient/family education, and DME and/or AE instructions  RECOMMENDED OTHER SERVICES: PT, ST  CONSULTED AND AGREED WITH PLAN OF CARE: Patient  PLAN FOR NEXT SESSION: see above  Danelle Earthly, MS, OTR/L  12/31/2023, 4:31 PM

## 2023-12-28 NOTE — Therapy (Addendum)
 OUTPATIENT PHYSICAL THERAPY NEURO TREATMENT/Physical Therapy Progress Note   Dates of reporting period  11/14/2023   to   12/28/2023    Patient Name: Lindsey [name] MRN: [id number] DOB:11-20-1955, 68 y.o., female Today's Date: 12/28/2023   PCP: Emogene Morgan, MD  REFERRING PROVIDER: Horton Chin, MD   END OF SESSION:   PT End of Session - 12/28/23 1022     Visit Number 10    Number of Visits 24    Date for PT Re-Evaluation 02/06/24    Progress Note Due on Visit 20    PT Start Time 1015    PT Stop Time 1055    PT Time Calculation (min) 40 min    Equipment Utilized During Treatment Gait belt    Activity Tolerance Patient tolerated treatment well    Behavior During Therapy WFL for tasks assessed/performed               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 No pertinent surgical history. 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   REFERRING DIAG:  Diagnosis  I69.398,R25.2 (ICD-10-CM) - Spasticity as late effect of cerebrovascular accident (CVA)    THERAPY DIAG:  Muscle weakness (generalized)  Other lack of coordination  Acute ischemic left MCA stroke (HCC)  Abnormality of gait and mobility  Unsteadiness on feet  Difficulty in walking, not elsewhere classified  Aphasia  Rationale for Evaluation and Treatment: Rehabilitation  SUBJECTIVE:  SUBJECTIVE STATEMENT:  Patient reports doing well overall and no new issues- States no falls and no pain.    From EVAL: Daughter present for Evaluation to provide hx. Pt is familiar to this clinic and has been seen for multiple bouts PT since initial CVA in 2021. Pt d/c'ed from PT serviced in April of 2024 with plans to receive PT services through [facility]. Daughter reports that she was only receiving minimal therapy in facility. Reports at least 1 fall in the last 6 months and daughter asked staff to transfer pt with lift since fall.  Daughter would want pt to demonstrate improve safety with transfers to allow bil transfers with reduced use of rail to allow pt to return home for assisted living.  Unable to discern whether pt in SNF or assisted living care from hx.    Pt accompanied by: self and family member   PERTINENT HISTORY:  1. Acute Ischemic Left MCA Stroke:Right Hemiplegia: Ordered outpatient OT, PT, and SLP. She has had a HFU appointment with Neurology. Discussed that I can provide new scripts next month if needed. Communicated with her providers regarding positive reinforcement. She has made excellent gains in strength as well as cognition and speech!    -Provided with new disability placard.    2. Essential Hypertension: BP is currently very elevated and discussed with patient that it would be unsafe for her to receive injections today as the injections can temporarily increase her pain, which can increase her blood pressure, and put her at risk for stroke. Discussed starting the medication Valsartan 40mg  daily and logging blood pressures daily at home and she is agreeable. Medications reviewed and she is taking Amlodipine 5mg  and lopressor.  Provided with a list of foods that will help lower her BP.  BP reviewed and improved at 136/82 -continue clonidine -Advised checking BP daily at home and logging results to bring into follow-up appointment with PCP and myself. -Reviewed BP meds today.  -Advised regarding healthy foods that can help lower blood pressure and provided with a list:  PAIN:  Are you having pain? No  PRECAUTIONS: Fall  RED FLAGS: None   WEIGHT BEARING RESTRICTIONS: No  FALLS: Has patient fallen in last 6 months? Yes. Number of falls 1  LIVING ENVIRONMENT: Lives with: lives with their family and lives in a skilled nursing facility Lives in: Other SNF  Stairs: No Has following equipment at home: Hemi walker and Wheelchair (manual)  PLOF: Requires assistive device for independence, Needs assistance with ADLs, Needs assistance with homemaking, and WC level currently   PATIENT GOALS: move better - walking or transferring with hemi walking   OBJECTIVE:  Note: Objective measures were completed at Evaluation unless otherwise noted.  DIAGNOSTIC FINDINGS:  Head CT:  IMPRESSION: 1. No acute intracranial abnormality. 2. No acute displaced fracture or traumatic listhesis of the cervical spine.  Cervical CT:  MPRESSION: 1. No acute intracranial abnormality. 2. No acute displaced fracture or traumatic listhesis of the cervical spine.  COGNITION: Overall cognitive status: History of cognitive impairments - at  baseline   SENSATION: Light touch: Impaired  Proprioception: Impaired  Able to detect depe pressure   COORDINATION: Increased tone on the RLE   EDEMA:  Mild RLE distal edema   MUSCLE TONE: RLE: Mild and Moderate  MUSCLE LENGTH: Hamstrings: grossly limited    POSTURE: rounded shoulders, forward head, and posterior bias   LOWER EXTREMITY ROM:     Active  Right Eval Left Eval  Hip flexion  Limited to 5 deg beyond 90 in sitting  Knee flexion  90deg in sitting  Knee extension  Lacking 10 deg full extension  Ankle dorsiflexion  none  Ankle plantarflexion  none   (Blank rows = not tested)  LOWER EXTREMITY MMT:    MMT Right Eval Left Eval  Hip flexion  2+  Hip extension    Hip abduction  2  Hip adduction  3  Hip internal rotation    Hip external rotation    Knee flexion  2+  Knee extension  3+  Ankle dorsiflexion  0  Ankle plantarflexion  0  Ankle inversion    Ankle eversion    (Blank rows = not tested)  BED MOBILITY:  Sit to supine Mod A Supine to sit Mod A Rolling to Right Min A Rolling to Left Min A  TRANSFERS: Assistive device utilized: Hemi walker  Sit to stand: Mod A Stand to sit: Mod A Chair to chair: Mod A Floor:  unable to perform     CURB:  Level of Assistance: Total A Assistive device utilized:  rail Curb Comments: unable to perform   STAIRS: Unable to perform at eval   GAIT: Gait pattern:  non-functional constant posterior bias , step to pattern, decreased stance time- Right, Right hip hike, lateral hip instability, and lateral lean- Left Distance walked: 76ft Assistive device utilized: Hemi walker Level of assistance: Mod A Comments: moderate cues for sequencing of HW and gait pattern in turns   FUNCTIONAL TESTS:  5 times sit to stand: 48 sec with mod Assist from PT  Timed up and go (TUG): unable to perform  10 meter walk test: 6 ft with min-mod assist and HW.  FIST:  Function In Sitting Test (FIST)  (1/2 femur on surface;  hips/knees flexed to 90deg)   - indicate bed or mat table / step stool if used  SCORING KEY: 4 = Independent (completes task independently & successfully) 3 = Verbal Cues/Increased Time (completes task independently & successfully and only needs more time/cues) 2 = Upper Extremity Support (must use UE for support or assistance to complete successfully) 1 = Needs Assistance (unable to complete w/o physical assist; DOCUMENT LEVEL: min, mod, max) 0 = Dependent (requires complete physical assist; unable to complete successfully even w/ physical assist)  Randomly Administer Once Throughout Exam  4 - Anterior Nudge (superior sternum)  4 - Posterior Nudge (between scapular spines)  4 - Lateral Nudge (to dominant side at acromion)     4 - Static sitting (30 seconds)  4 - Sitting, shake 'no' (left and right)  4 - Sitting, eyes closed (30 seconds)   3 - Sitting, lift foot (dominant side, lift foot 1 inch twice)    4 - Pick up object from behind (object at midline, hands breadth posterior)  1 mod assist - Forward reach (use dominant arm, must complete full motion) 1 mod assist - Lateral reach (use dominant arm, clear opposite ischial tuberosity) 1 mod assist  - Pick up object from floor (from between feet)   1 mod assist - Posterior scooting (move backwards 2 inches)  1 min assist  - Anterior scooting (move forward 2 inches)  1 min assist  - Lateral scooting (move to dominant side 2 inches)    TOTAL = 37/56  Notes/comments: apraxia limiting success of scooting and reacting tasks.  MCD > 5 points MCID for IP REHAB > 6 points      PATIENT SURVEYS:  To be completed  TREATMENT DATE: 12/28/2023  Unless otherwise stated, min A was provided and gait belt donned in order to ensure pt safety throughout session.  Physical therapy treatment session today consisted of  completing assessment of goals and administration of testing as demonstrated and documented in flow sheet, treatment, and goals section of this note. Addition treatments may be found below.   stand pivot w/c>EOM using L hemiwalker with min A for balance and facilitating R LE positioning for lateral step - Continued decreased gluteal activation RLE. Performed 3 trials- more difficulty pivoting to right.   Repeated sit>stand from EOM with mirror feedback - Patient inconsistent sometimes pushing with posterior legs against mat, with posterior lean when rising - provided repetitive cues to lean forward and look ahead for anterior trunk lean - 15 reps total- Patient reported exhausted.  *pt required PT to don belt to jeans for proper fit as jeans are too tight in waist.      PATIENT EDUCATION: Education details: POC.  HEP.  Person educated: Patient and Child(ren) Education method: Explanation Education comprehension: verbalized understanding  HOME EXERCISE PROGRAM: Access Code: [email or web address] URL: [email or web address] Date: 12/05/2023 Prepared by: Grier Rocher  Exercises - Sit to Stand with Counter Support  - 1 x daily - 3-4 x weekly - 2 sets - 10 reps - Seated Long Arc Quad  - 1 x daily - 3-4 x weekly - 2 sets - 10 reps - 2 hold - Seated March  - 1 x daily - 3-4 x weekly - 2 sets - 10 reps - 2 hold - Standing Hip Abduction with Counter Support  - 1 x daily - 7 x weekly - 3 sets - 5 reps - Wide Stance with Counter Support  - 1 x daily - 7 x weekly - 3 sets - 2 reps - 30 hold - Seated Hip Abduction  - 1 x daily - 7 x weekly - 3 sets - 10 reps - Supine Bridge  - 1 x daily - 7 x weekly - 3 sets - 10 reps - Supine Short Arc Quad  - 1 x daily - 7 x weekly - 3 sets - 10 reps - Bent Knee Fallouts  - 1 x daily - 7 x weekly - 3 sets - 10 reps - Small Range Straight Leg Raise  - 1 x daily - 7 x weekly - 3 sets - 10 reps - Supine Heel Slide  - 1 x daily - 7 x weekly - 3 sets - 10  reps   GOALS: Goals reviewed with patient? Yes  SHORT TERM GOALS: Target date: 12/13/2023    Patient will be independent in home exercise program to improve strength/mobility for better functional independence with ADLs. Baseline: to be provided on visit 2: 12/28/2023- No caregiver with patient to determine if compliant with HEP Goal status: INITIAL   LONG TERM GOALS: Target date: 02/07/2024     2.  Patient (> 28 years old) will complete five times sit to stand test by 15 seconds and only min assist indicating an increased LE strength and improved balance. Baseline: 48 with mod assist from PT; 12/28/2023= 44 sec from EOM at lowest postion (approx 21 in) with Left UE support and CGA to min A from PT Goal status: Progressing 3.  Patient will increase FIST score by > 6 points to demonstrate decreased fall risk during functional activities Baseline: 37 Goal status: INITIAL  4.  Patient will complete 10 meter walk test to with [facility] in less than  1 minute with min assist as to improve gait speed for better community ambulation and to reduce fall risk. Baseline: 6 ft with HW and mod assist; 12/28/2023=18 feet with HW- mod assist  Goal status: PROGRESSING  5.  Patient will perform stand pivot transfer to and from [facility] with HW and CGA for safety to allow safe transfer to toilet with daughter without use of rail on the wall. Baseline: mod assist and heavy use of rail. 12/28/2023= Min to Mod assist with repetitive VC for technique Goal status: Progressing  6.  Patient will improve bed mobility to min assist only to reduce care giver burden and improve safety of transfers  Baseline: mod-max assist Goal status: INITIAL  ASSESSMENT:  CLINICAL IMPRESSION:  Patient presents with good motivation for assessment visit. Goals were reassesed and PT interventions continue to  focus on improved sit>stand transfers and improved R LE stance phase control to improve stand pivot transfers. Pt continues to require  repetitive VC and min assist to safely transfer and walk. She did make some progress with increased gait distance.  Pt will benefit from continued skilled PT to address strength and balance deficits to improve safety for transfers, reduce care giver burden, and allow return to PLOF and possible return home from Assisted living.   OBJECTIVE IMPAIRMENTS: Abnormal gait, cardiopulmonary status limiting activity, decreased activity tolerance, decreased balance, decreased cognition, decreased coordination, decreased endurance, decreased knowledge of use of DME, decreased mobility, difficulty walking, decreased ROM, decreased strength, hypomobility, increased muscle spasms, impaired flexibility, impaired sensation, impaired tone, impaired UE functional use, improper body mechanics, and postural dysfunction.   ACTIVITY LIMITATIONS: carrying, lifting, bending, standing, squatting, stairs, transfers, bed mobility, continence, bathing, toileting, dressing, reach over head, hygiene/grooming, and locomotion level  PARTICIPATION LIMITATIONS: cleaning, interpersonal relationship, shopping, and community activity  PERSONAL FACTORS: 3+ comorbidities: HTN, chronic CVA, Afib, Lupus   are also affecting patient's functional outcome.   REHAB POTENTIAL: Fair chronic CVA with poor progress in prior interventions with this clinic  CLINICAL DECISION MAKING: Unstable/unpredictable  EVALUATION COMPLEXITY: High  PLAN:  PT FREQUENCY: 1-2x/week  PT DURATION: 12 weeks  PLANNED INTERVENTIONS: 97110-Therapeutic exercises, 97530- Therapeutic activity, [id number]- Neuromuscular re-education, 97535- Self Care, [id number]- Manual therapy, [phone]- Gait training, [phone]- Orthotic Fit/training, [phone]- Aquatic Therapy, [phone]- Splinting, [id number]- Electrical stimulation (unattended), [phone]- Electrical stimulation (manual), Patient/Family education, Balance training, Stair training, Joint mobilization, Vestibular training, Visual/preceptual  remediation/compensation, Cognitive remediation, DME instructions, Wheelchair mobility training, Cryotherapy, and Moist heat  PLAN FOR NEXT SESSION:   - stretching of quads for tone management to improve knee flexion ROM to allow proper foot positioning underneath BOS during transfers - stretching hip flexors to improve upright posture in standing -  R glute activation and strength during standing  Continue  standing balance. Gait. Transfers.   Louis Meckel, PT Physical Therapist - [facility]  9:02 PM 12/28/23

## 2024-01-02 ENCOUNTER — Ambulatory Visit: Payer: Medicare Other | Admitting: Speech Pathology

## 2024-01-02 ENCOUNTER — Ambulatory Visit: Payer: Medicare Other

## 2024-01-02 ENCOUNTER — Ambulatory Visit: Payer: Medicare Other | Attending: Physical Medicine and Rehabilitation | Admitting: Physical Therapy

## 2024-01-02 DIAGNOSIS — R41841 Cognitive communication deficit: Secondary | ICD-10-CM | POA: Diagnosis present

## 2024-01-02 DIAGNOSIS — R262 Difficulty in walking, not elsewhere classified: Secondary | ICD-10-CM | POA: Diagnosis present

## 2024-01-02 DIAGNOSIS — I63542 Cerebral infarction due to unspecified occlusion or stenosis of left cerebellar artery: Secondary | ICD-10-CM | POA: Insufficient documentation

## 2024-01-02 DIAGNOSIS — R482 Apraxia: Secondary | ICD-10-CM | POA: Diagnosis present

## 2024-01-02 DIAGNOSIS — M6281 Muscle weakness (generalized): Secondary | ICD-10-CM | POA: Insufficient documentation

## 2024-01-02 DIAGNOSIS — R278 Other lack of coordination: Secondary | ICD-10-CM | POA: Diagnosis present

## 2024-01-02 DIAGNOSIS — R2681 Unsteadiness on feet: Secondary | ICD-10-CM | POA: Insufficient documentation

## 2024-01-02 DIAGNOSIS — R4701 Aphasia: Secondary | ICD-10-CM | POA: Insufficient documentation

## 2024-01-02 DIAGNOSIS — R2689 Other abnormalities of gait and mobility: Secondary | ICD-10-CM | POA: Diagnosis present

## 2024-01-02 DIAGNOSIS — R269 Unspecified abnormalities of gait and mobility: Secondary | ICD-10-CM | POA: Insufficient documentation

## 2024-01-02 DIAGNOSIS — I63512 Cerebral infarction due to unspecified occlusion or stenosis of left middle cerebral artery: Secondary | ICD-10-CM | POA: Diagnosis present

## 2024-01-02 NOTE — Therapy (Signed)
 OUTPATIENT PHYSICAL THERAPY NEURO TREATMENT/Physical Therapy Progress Note   Dates of reporting period  11/14/2023   to   12/28/2023    Patient Name: Lindsey [name] MRN: [id number] DOB:1956/05/16, 68 y.o., female Today's Date: 01/02/2024   PCP: Emogene Morgan, MD  REFERRING PROVIDER: Horton Chin, MD   END OF SESSION:   PT End of Session - 01/02/24 0855     Visit Number 11    Number of Visits 24    Date for PT Re-Evaluation 02/06/24    Progress Note Due on Visit 20    PT Start Time 0852    PT Stop Time 0930    PT Time Calculation (min) 38 min    Equipment Utilized During Treatment Gait belt    Activity Tolerance Patient tolerated treatment well    Behavior During Therapy WFL for tasks assessed/performed               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   REFERRING DIAG:  Diagnosis  I69.398,R25.2 (ICD-10-CM) - Spasticity as late effect of cerebrovascular accident (CVA)    THERAPY DIAG:  Abnormality of gait and mobility  Muscle weakness (generalized)  Unsteadiness on feet  Other lack of coordination  Difficulty in walking, not elsewhere classified  Apraxia  Rationale for Evaluation and Treatment: Rehabilitation  SUBJECTIVE:  SUBJECTIVE STATEMENT:  Patient reports doing well overall and no new issues-  States no falls and no pain.    From EVAL: Daughter present for Evaluation to provide hx. Pt is familiar to this clinic and has been seen for multiple bouts PT since initial CVA in 2021. Pt d/c'ed from PT serviced in April of 2024 with plans to receive PT services through [facility]. Daughter reports that she was only receiving minimal therapy in facility. Reports at least 1 fall in the last 6 months and daughter asked staff to transfer pt with lift since fall.  Daughter would want pt to demonstrate improve safety with transfers to allow bil transfers with reduced use of rail to allow pt to return home for assisted living.  Unable to discern whether pt in SNF or assisted living care from hx.    Pt accompanied by: self and family member   PERTINENT HISTORY:  1. Acute Ischemic Left MCA Stroke:Right Hemiplegia: Ordered outpatient OT, PT, and SLP. She has had a HFU appointment with Neurology. Discussed that I can provide new scripts next month if needed. Communicated with her providers regarding positive reinforcement. She has made excellent gains in strength as well as cognition and speech!   -Provided with new disability placard.    2.  Essential Hypertension: BP is currently very elevated and discussed with patient that it would be unsafe for her to receive injections today as the injections can temporarily increase her pain, which can increase her blood pressure, and put her at risk for stroke. Discussed starting the medication Valsartan 40mg  daily and logging blood pressures daily at home and she is agreeable. Medications reviewed and she is taking Amlodipine 5mg  and lopressor.  Provided with a list of foods that will help lower her BP.  BP reviewed and improved at 136/82 -continue clonidine -Advised checking BP daily at home and logging results to bring into follow-up appointment with PCP and myself. -Reviewed BP meds today.  -Advised regarding healthy foods that can help lower blood pressure and provided with a list:  PAIN:  Are you having pain? No  PRECAUTIONS: Fall  RED FLAGS: None   WEIGHT BEARING RESTRICTIONS: No  FALLS: Has patient fallen in last 6 months? Yes. Number of falls 1  LIVING ENVIRONMENT: Lives with: lives with their family and lives in a skilled nursing facility Lives in: Other SNF  Stairs: No Has following equipment at home: Hemi walker and Wheelchair (manual)  PLOF: Requires assistive device for independence, Needs assistance with ADLs, Needs assistance with homemaking, and WC level currently   PATIENT GOALS: move better - walking or transferring with hemi walking   OBJECTIVE:  Note: Objective measures were completed at Evaluation unless otherwise noted.  DIAGNOSTIC FINDINGS:  Head CT:  IMPRESSION: 1. No acute intracranial abnormality. 2. No acute displaced fracture or traumatic listhesis of the cervical spine.  Cervical CT:  MPRESSION: 1. No acute intracranial abnormality. 2. No acute displaced fracture or traumatic listhesis of the cervical spine.  COGNITION: Overall cognitive status: History of cognitive impairments - at baseline   SENSATION: Light touch: Impaired   Proprioception: Impaired  Able to detect depe pressure   COORDINATION: Increased tone on the RLE   EDEMA:  Mild RLE distal edema   MUSCLE TONE: RLE: Mild and Moderate  MUSCLE LENGTH: Hamstrings: grossly limited    POSTURE: rounded shoulders, forward head, and posterior bias   LOWER EXTREMITY ROM:     Active  Right Eval Left Eval  Hip  flexion  Limited to 5 deg beyond 90 in sitting  Knee flexion  90deg in sitting  Knee extension  Lacking 10 deg full extension  Ankle dorsiflexion  none  Ankle plantarflexion  none   (Blank rows = not tested)  LOWER EXTREMITY MMT:    MMT Right Eval Left Eval  Hip flexion  2+  Hip extension    Hip abduction  2  Hip adduction  3  Hip internal rotation    Hip external rotation    Knee flexion  2+  Knee extension  3+  Ankle dorsiflexion  0  Ankle plantarflexion  0  Ankle inversion    Ankle eversion    (Blank rows = not tested)  BED MOBILITY:  Sit to supine Mod A Supine to sit Mod A Rolling to Right Min A Rolling to Left Min A  TRANSFERS: Assistive device utilized: Hemi walker  Sit to stand: Mod A Stand to sit: Mod A Chair to chair: Mod A Floor:  unable to perform     CURB:  Level of Assistance: Total A Assistive device utilized:  rail Curb Comments: unable to perform   STAIRS: Unable to perform at eval   GAIT: Gait pattern:  non-functional constant posterior bias , step to pattern, decreased stance time- Right, Right hip hike, lateral hip instability, and lateral lean- Left Distance walked: 21ft Assistive device utilized: Hemi walker Level of assistance: Mod A Comments: moderate cues for sequencing of HW and gait pattern in turns   FUNCTIONAL TESTS:  5 times sit to stand: 48 sec with mod Assist from PT  Timed up and go (TUG): unable to perform  10 meter walk test: 6 ft with min-mod assist and HW.  FIST:  Function In Sitting Test (FIST)  (1/2 femur on surface; hips/knees flexed to 90deg)   - indicate bed or  mat table / step stool if used  SCORING KEY: 4 = Independent (completes task independently & successfully) 3 = Verbal Cues/Increased Time (completes task independently & successfully and only needs more time/cues) 2 = Upper Extremity Support (must use UE for support or assistance to complete successfully) 1 = Needs Assistance (unable to complete w/o physical assist; DOCUMENT LEVEL: min, mod, max) 0 = Dependent (requires complete physical assist; unable to complete successfully even w/ physical assist)  Randomly Administer Once Throughout Exam  4 - Anterior Nudge (superior sternum)  4 - Posterior Nudge (between scapular spines)  4 - Lateral Nudge (to dominant side at acromion)     4 - Static sitting (30 seconds)  4 - Sitting, shake 'no' (left and right)  4 - Sitting, eyes closed (30 seconds)   3 - Sitting, lift foot (dominant side, lift foot 1 inch twice)    4 - Pick up object from behind (object at midline, hands breadth posterior)  1 mod assist - Forward reach (use dominant arm, must complete full motion) 1 mod assist - Lateral reach (use dominant arm, clear opposite ischial tuberosity) 1 mod assist  - Pick up object from floor (from between feet)   1 mod assist - Posterior scooting (move backwards 2 inches)  1 min assist  - Anterior scooting (move forward 2 inches)  1 min assist  - Lateral scooting (move to dominant side 2 inches)    TOTAL = 37/56  Notes/comments: apraxia limiting success of scooting and reacting tasks.  MCD > 5 points MCID for IP REHAB > 6 points      PATIENT SURVEYS:  To  be completed                                                                                                                               TREATMENT DATE: 01/02/2024  Unless otherwise stated, min A was provided and gait belt donned in order to ensure pt safety throughout session.  Sit<>stand x10 +4 with HW cues for sequencing and improved anterior weight shift and sequencing.    Standing with RW for force WB through RUE 2 x 0 se  Weight shift in standing with RW x 12 bil   Forwards step with RW x 10 bil  Forward step with HW x 10 bil  Gait with HW 2x 68ft with min fading to mod assist due to posterior bias on the RLE tone. On first bout. Min assist overall for weight shift to the L on second bout. Constant cues for erect posture to allow proper sequencing on the RLE.   Quad stretch 3 x 45 sec with increased pressure to end range as tone allows.       PATIENT EDUCATION: Education details: POC.  Pt educated throughout session about proper posture and technique with exercises. Improved exercise technique, movement at target joints, use of target muscles after min to mod verbal, visual, tactile cues.   Person educated: Patient and Child(ren) Education method: Explanation Education comprehension: verbalized understanding  HOME EXERCISE PROGRAM: Access Code: [email or web address] URL: [email or web address] Date: 12/05/2023 Prepared by: Grier Rocher  Exercises - Sit to Stand with Counter Support  - 1 x daily - 3-4 x weekly - 2 sets - 10 reps - Seated Long Arc Quad  - 1 x daily - 3-4 x weekly - 2 sets - 10 reps - 2 hold - Seated March  - 1 x daily - 3-4 x weekly - 2 sets - 10 reps - 2 hold - Standing Hip Abduction with Counter Support  - 1 x daily - 7 x weekly - 3 sets - 5 reps - Wide Stance with Counter Support  - 1 x daily - 7 x weekly - 3 sets - 2 reps - 30 hold - Seated Hip Abduction  - 1 x daily - 7 x weekly - 3 sets - 10 reps - Supine Bridge  - 1 x daily - 7 x weekly - 3 sets - 10 reps - Supine Short Arc Quad  - 1 x daily - 7 x weekly - 3 sets - 10 reps - Bent Knee Fallouts  - 1 x daily - 7 x weekly - 3 sets - 10 reps - Small Range Straight Leg Raise  - 1 x daily - 7 x weekly - 3 sets - 10 reps - Supine Heel Slide  - 1 x daily - 7 x weekly - 3 sets - 10 reps   GOALS: Goals reviewed with patient? Yes  SHORT TERM GOALS: Target date:  12/13/2023    Patient will be independent in home exercise  program to improve strength/mobility for better functional independence with ADLs. Baseline: to be provided on visit 2: 12/28/2023- No caregiver with patient to determine if compliant with HEP Goal status: INITIAL   LONG TERM GOALS: Target date: 02/07/2024     2.  Patient (> 70 years old) will complete five times sit to stand test by 15 seconds and only min assist indicating an increased LE strength and improved balance. Baseline: 48 with mod assist from PT; 12/28/2023= 44 sec from EOM at lowest postion (approx 21 in) with Left UE support and CGA to min A from PT Goal status: Progressing 3.  Patient will increase FIST score by > 6 points to demonstrate decreased fall risk during functional activities Baseline: 37 Goal status: INITIAL  4.  Patient will complete 10 meter walk test to with HW in less than 1 minute with min assist as to improve gait speed for better community ambulation and to reduce fall risk. Baseline: 6 ft with HW and mod assist; 12/28/2023=18 feet with HW- mod assist  Goal status: PROGRESSING  5.  Patient will perform stand pivot transfer to and from [facility] with HW and CGA for safety to allow safe transfer to toilet with daughter without use of rail on the wall. Baseline: mod assist and heavy use of rail. 12/28/2023= Min to Mod assist with repetitive VC for technique Goal status: Progressing  6.  Patient will improve bed mobility to min assist only to reduce care giver burden and improve safety of transfers  Baseline: mod-max assist Goal status: INITIAL  ASSESSMENT:  CLINICAL IMPRESSION:  Patient presents with good motivation for assessment visit. PT treatmeant focused on improved balance, posture, weight shift, and ROM in the RLE. Pt tolerated well and demonstrated improved posture with second bout of Gait training.  Pt will benefit from continued skilled PT to address strength and balance deficits to improve safety  for transfers, reduce care giver burden, and allow return to PLOF and possible return home from Assisted living.   OBJECTIVE IMPAIRMENTS: Abnormal gait, cardiopulmonary status limiting activity, decreased activity tolerance, decreased balance, decreased cognition, decreased coordination, decreased endurance, decreased knowledge of use of DME, decreased mobility, difficulty walking, decreased ROM, decreased strength, hypomobility, increased muscle spasms, impaired flexibility, impaired sensation, impaired tone, impaired UE functional use, improper body mechanics, and postural dysfunction.   ACTIVITY LIMITATIONS: carrying, lifting, bending, standing, squatting, stairs, transfers, bed mobility, continence, bathing, toileting, dressing, reach over head, hygiene/grooming, and locomotion level  PARTICIPATION LIMITATIONS: cleaning, interpersonal relationship, shopping, and community activity  PERSONAL FACTORS: 3+ comorbidities: HTN, chronic CVA, Afib, Lupus   are also affecting patient's functional outcome.   REHAB POTENTIAL: Fair chronic CVA with poor progress in prior interventions with this clinic  CLINICAL DECISION MAKING: Unstable/unpredictable  EVALUATION COMPLEXITY: High  PLAN:  PT FREQUENCY: 1-2x/week  PT DURATION: 12 weeks  PLANNED INTERVENTIONS: 97110-Therapeutic exercises, 97530- Therapeutic activity, [id number]- Neuromuscular re-education, 97535- Self Care, 16109- Manual therapy, [phone]- Gait training, [phone]- Orthotic Fit/training, [phone]- Aquatic Therapy, [phone]- Splinting, 97014- Electrical stimulation (unattended), [phone]- Electrical stimulation (manual), Patient/Family education, Balance training, Stair training, Joint mobilization, Vestibular training, Visual/preceptual remediation/compensation, Cognitive remediation, DME instructions, Wheelchair mobility training, Cryotherapy, and Moist heat  PLAN FOR NEXT SESSION:   - stretching of quads for tone management to improve knee flexion ROM to  allow proper foot positioning underneath BOS during transfers - stretching hip flexors to improve upright posture in standing -  R glute activation and strength during standing  Continue  standing balance. Gait. Transfers.  Grier Rocher PT, DPT  Physical Therapist -   [facility]  8:59 AM 01/02/24

## 2024-01-02 NOTE — Therapy (Signed)
 OUTPATIENT SPEECH LANGUAGE PATHOLOGY  TREATMENT NOTE   Patient Name: Lindsey [name] MRN: [id number] DOB:March 28, 1956, 68 y.o., female Today's Date: 01/02/2024  PCP: Karie Fetch, MD REFERRING PROVIDER: Sula Soda, MD   End of Session - 01/02/24 0930     Visit Number 8    Number of Visits 17    Date for SLP Re-Evaluation 01/11/24    Authorization Type Medicare Part A/ Medicare Part B    Progress Note Due on Visit 10    SLP Start Time 0930    SLP Stop Time  1015    SLP Time Calculation (min) 45 min    Activity Tolerance Patient tolerated treatment well             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) 09/24/2021   Cerebral embolism with cerebral infarction 09/23/2021   Pyelonephritis 09/19/2021   Pain due to onychomycosis of toenails of both feet 11/19/2020   Normocytic anemia 05/31/2020   Acute ischemic left MCA stroke (HCC) 04/30/2020   Right hemiplegia (HCC) 04/30/2020   Cerebrovascular accident (HCC) 04/21/2020   Atrial fibrillation with RVR (HCC) 04/21/2020   Essential hypertension 04/21/2020   Alcohol abuse 04/21/2020    ONSET DATE: 04/30/2020 initial CVA; 10/25/2020  second CVA;  11/07/2023 date of  referral  REFERRING DIAG: R47.01 (ICD-10-CM) - Aphasia   THERAPY DIAG:  Aphasia  Apraxia  Cognitive communication deficit  Rationale for Evaluation and Treatment Rehabilitation  SUBJECTIVE:   PERTINENT HISTORY and DIAGNOSTIC FINDINGS:   Patient is a 68 y.o. female with the following prior history:  MRI 04/21/2020 Brain: Acute infarct left MCA territory. Infarct involves the left frontal lobe, insula as well as the caudate and putamen. There are areas of hemorrhage within the caudate and putamen as well as in the left frontal lobe.  Scattered small white matter hyperintensities elsewhere compatible with chronic microvascular ischemia. Ventricle size normal. Negative for mass lesion.  Inpatient Rehabilitation ST Services: 04/2020 thru 05/2020 Outpatient ST services 08/2020 thru 08/2021 Pt discharged with Lingraphica Device; per chart using device to assist with communication  MRI 09/22/2021 IMPRESSION: 1. Acute moderate size Left AICA cerebellar infarct. Petechial hemorrhage but no malignant hemorrhagic transformation. Cytotoxic edema but no posterior fossa mass effect at this time.  2. Underlying extensive chronic left MCA territory encephalomalacia related to the 2021 infarct. Associated left brainstem Wallerian degeneration since last year. And small superimposed chronic Left PICA cerebellar infarcts, but also new since last year.   Inpatient Rehabilitation ST Services at [facility]: 10/02/2021 thru 10/21/2021 Outpatient ST Services [facility] 10/2021 thru 03/08/2022 Pt discharged d/t reaching maximal rehabilitation potential; "3/4 LTGs particially met due to limited caregiver availability"  Pt  is currently residing at a SNF [facility]). Additional medical history includes alcoholism,  AFib, Renal Artery thrombosis, situational depression, HTN, Pyelnephritis, Seizure activity, Constipation, AKI. Vitamin D deficiency, and urinary incontinence.   PAIN:  Are you having  pain? No  FALLS: Has patient fallen in last 6 months?  See PT evaluation for details  LIVING ENVIRONMENT: Lives with: lives with their family and lives in a skilled nursing facility   PLOF:  Level of assistance: Needed assistance with ADLs, Needed assistance with IADLS   PATIENT GOALS    pt unable to state - her daughter reports she would like for pt to be able to "communicate basics, return to abilities before second stroke (09/2021)"  SUBJECTIVE STATEMENT: Pt arrived eager with TouchTalk device, accompanied by her grandson Pt accompanied by: grandson  OBJECTIVE:   TODAY'S TREATMENT:     Skilled treatment session focused on her communication goals. SLP facilitated the session by providing the following interventions:  TouchTalk used to access TalkPath Therapy Word ID - level 1 18 out of 20; level 2 - 12 out of 20 improving to 20 out of 20 with moderate cues d/t increased number of choices Sentence Completion - level 1  with moderate verbal cues (reading the sentence and choices) pt able to complete correctly 10 out of 20  PATIENT EDUCATION: Education details: use of low tech communication  Person educated: Patient and Child(ren) Education method: Explanation Education comprehension: needs further education  HOME EXERCISE PROGRAM:   Worksheets provided to complete when her daughter visits her    GOALS:  Goals reviewed with patient? Yes  SHORT TERM GOALS: Target date: 10 sessions  Given verbal or visual prompt, pt will correctly ID written word/object/icon in field of 4-6 to communicate wants/needs with 75% accuracy given occasional mod A over 2 sessions  Baseline: Goal status: INITIAL   LONG TERM GOALS: Target date: 01/11/2024  Pt will utilize multimodal communication to express basic wants/needs as reported by pt and her daughter in 3 out of 5 opportunities.  Baseline:  Goal status: INITIAL   ASSESSMENT:  CLINICAL IMPRESSION: Patient is a 68 y.o. right  handed female who was seen today for a speech language treatment d/t history of Left MCA CVA. 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 Her verbal language Is halting and while she does produce some islands of improved speech intelligibility during naming tasks and spontaneous responses, she speech contains neologisms with evidence of word finding difficulty. Pt's daughter reports that pt doesn't use her Lingraphica device "as much as she should" and it is only available to her during weekends at home for fear that it might be lost at pt's facility.   Pt appeared to really enjoy using TouchTalk for accessing TalkPath Therapy exercises.  See the above treatment note for details.   OBJECTIVE IMPAIRMENTS include attention, aphasia, apraxia, and dysarthria. These impairments are limiting patient from effectively communicating at home and in community. Factors affecting potential to achieve goals and functional outcome are ability to learn/carryover information, co-morbidities, cooperation/participation level, medical prognosis, previous level of function, severity of impairments, family/community support, and time post onset . Patient will benefit from skilled SLP services to address above impairments and improve overall function.  REHAB POTENTIAL: Fair time post onset, multiple courses of ST services, decreased carryover of therapy concepts during previous sessions; pt discharged from previous course of ST services (2023) requiring moderate assistance  for communication  PLAN: SLP FREQUENCY: 1-2x/week  SLP DURATION: 8 weeks  PLANNED INTERVENTIONS: Language facilitation, Internal/external aids, Functional tasks, Multimodal communication approach, SLP instruction and feedback, Compensatory strategies, Patient/family education, and [id number] Treatment of speech (30 or 45 min)     Nell Gales B. Dreama Saa, M.S.,  CCC-SLP, Tree surgeon Certified Brain Injury Specialist [facility]  [facility] Office [phone] Ascom [phone] Fax [phone]

## 2024-01-02 NOTE — Therapy (Unsigned)
 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  Rationale for Evaluation  and Treatment: Rehabilitation  SUBJECTIVE:  SUBJECTIVE STATEMENT: Pt acknowledged needing to use the bathroom following PT session. Pt accompanied by: self, daughter  PERTINENT HISTORY: Pt. is a 68 y.o. 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 Pt. has Right sided weakness, and receptive, and expressive aphasia. Pt. has had a previous CVA with right sided weakness in July 2021. PMHx includes: AFib, Renal Artery thrombosis, situational depression, HTN, Pyelnephritis, Seizure activity, COnstipation, AKI. Vitamin D deficiency, and urinary incontinence   PRECAUTIONS: None  WEIGHT BEARING RESTRICTIONS: No  PAIN:  Are you having pain? No reports of pain  FALLS: Has patient fallen in last 6 months? Yes. Number of falls 1-ER visit  LIVING ENVIRONMENT: Pt. resides at [facility]. Pt. has very supportive family Stairs: One level Has following equipment at home:  Equipment provided through [employer]  PLOF: Needs assistance with ADLs  PATIENT GOALS:  To improve ROM/flexibility in the RUE 2/2 increased tightness, and to improve ADL functioning in order to towards returning home.  OBJECTIVE:  Note: Objective measures were completed at Evaluation unless otherwise noted.  HAND DOMINANCE: Right  ADLs: Overall ADLs: Assist is provided from the staff at [facility]. Transfers/ambulation related to ADLs: Eating: independent, with set-up using the left hand Grooming: Independent with set-up using the left hand UB Dressing: Increased assist required, Mod-MaxA doffing jacket  LB Dressing: Increased assist required Toileting:  Staff Assists with toileting transfers, and toilet hygiene care. Bathing: Staff assists with bathing Tub Shower transfers: Requires staff assist Equipment: TBD  IADLs: Shopping: N/A Light housekeeping: Provided by staff Meal Prep: 3 Meals a day provided. Pt. Eats in the main dining room  when family visits, otherwise Pt. Eats in her room.  Community mobility: Relies on family for transportation to therapy. Per daughter- Transportation is provided for services not offered at the facility. Medication management: Provided by the facility Financial management: Daughter manages Handwriting: TBD  MOBILITY STATUS: Hx of falls  FUNCTIONAL OUTCOME MEASURES:   UPPER EXTREMITY ROM:    Passive ROM Right Eval  Left Eval AROM WFL  Shoulder flexion 0(46) 0 (90)  Shoulder abduction 0(64) 0 (90)  Shoulder adduction    Shoulder extension    Shoulder internal rotation    Shoulder external rotation  (25)  Elbow flexion 0(98) 0(131)  Elbow extension 0(full passive) 0 (full passive)  Wrist flexion 0(56) 0(90)  Wrist extension -56(30) (40)  Wrist ulnar deviation    Wrist radial deviation    Wrist pronation    Wrist supination    (Blank rows = not tested)  UPPER EXTREMITY MMT:     MMT Right eval Left Eval 5/5  Shoulder flexion 0/5 2/5   Shoulder abduction 0/5 1/5  Shoulder adduction  2-  Shoulder extension    Shoulder internal rotation  1  Shoulder external rotation    Middle trapezius    Lower trapezius    Elbow flexion 0/5 0  Elbow extension 0/5 2/5  Wrist flexion  1  Wrist extension 0/5 R3-  Wrist ulnar deviation    Wrist radial deviation    Wrist pronation 0/5   Wrist supination 0/5   (Blank rows = not tested)  R finger flexors: 2- R finger extensors: 0   HAND FUNCTION: N/A  COORDINATION: N/A  SENSATION: Diminished  EDEMA: No edema noted in the right hand today  MUSCLE TONE: impaired  COGNITION: Overall cognitive status: Aphasia  VISION: Wears glasses, no change from baseline  PERCEPTION: TBD  PRAXIS: Impaired: Motor planning                                                                                                                           TREATMENT DATE: 12/28/23 Moist heat modality to the R shoulder for muscle relaxation, with  simultaneous passive stretching noted below.   Therapeutic Exercise: -PROM through all joint ranges of the RUE for shoulder flexion, abduction, horizontal abduction, elbow flexion, extension, forearm supination, wrist extension, digit extension, and thumb abduction.  -Continued review of self PROM techniques to the RUE   Self Care: -Wc>toilet transfer (step pivot) using hemi walker; mod A.  Vc and tactile cues for lateral weight shift to ease ability to step R foot during step pivot transfers.  Utilized grab bar only for transfer from toilet back to wc d/t difficulty with hemiwalker  and pt feeling more support from grab bar. -Set up for frontal peri care -Dep for diaper removal and donning new, hiking and lowering pants.  Pt stood at grab bar for clothing management with intermittent vc for maintaining erect posture; CGA-min A for standing.  PATIENT EDUCATION: Education details: Functional transfer training Person educated: Patient Education method: Explanation, Demonstration, Tactile cues, and Verbal cues Education comprehension: verbalized understanding, returned demonstration, and verbal cues required, tactile cues  HOME EXERCISE PROGRAM: RUE self PROM  GOALS: Goals reviewed with patient? Yes  SHORT TERM GOALS: Target date: 12/27/2023    Pt. Will demonstrate supervision with HEPs for BUEs Baseline: Eval: No current HEPs  Goal status: INITIAL  LONG TERM GOALS: Target date: 02/07/2024    1.  Pt. Will improve right shoulder flexion by 20 degrees to assist with UE dressing Baseline: Eval: Right: 0(46) Goal status: INITIAL  2.  Pt. Will improve with shoulder abduction by 20 degrees to assist with underarm care, and hygiene Baseline: Eval: Right 0(64) Goal status: INITIAL  3.  Pt. Will improve right elbow flexion by 10 degrees to assist with hand to face patterns for gromming Baseline: Eval: Right 0(98) Goal status: INITIAL  4.  Pt. Will elicit active wrist extension, and digit  extension in preparation for rubbing lotion into the left forearm, and hand  Baseline:  Goal status: INITIAL  5.  Pt. Will perform UE dressing skills with MinA Baseline: Eval: Mod-MaxA doffing a jacket Goal status: INITIAL  6. Pt. Will require minA toileting transfers/ clothing management  Baseline: Eval: Pt. Requires increased assist for staff  Goal status: INITIAL   ASSESSMENT:  CLINICAL IMPRESSION: Attempted use of hemiwalker for transfer from wc to toilet this date, but utilized grab bar only for transfer from toilet back to wc d/t difficulty with hemiwalker and pt feeling more support from grab bar.  Pt required mod A for transfer to toilet with hemiwalker, and typically min A wc > toilet when using grab bar.  Still mod A sit to stand from standard height toilet using grab bar.  Pt improving with self ROM to the RUE.  Pt required mod tactile cues for RUE positioning to minimize tone while performing self passive elbow flexion.  Pt. continues to benefit from OT services to normalize tone in the dominant RUE, improve ROM for underarm hygiene care and UE dressing, as well as to improve toileting care, toilet transfers, and clothing negotiation tasks.    PERFORMANCE DEFICITS: in functional skills including ADLs, IADLs, coordination, dexterity, proprioception, sensation, ROM, strength, pain, Fine motor control, Gross motor control, balance, body mechanics, decreased knowledge of use of DME, and UE functional use, cognitive skills including problem solving, and psychosocial skills including coping strategies, environmental adaptation, and routines and behaviors.   IMPAIRMENTS: are limiting patient from ADLs, IADLs, and leisure.   CO-MORBIDITIES: may have co-morbidities  that affects occupational performance. Patient will benefit from skilled OT to address above impairments and improve overall function.  MODIFICATION OR ASSISTANCE TO COMPLETE EVALUATION: Min-Moderate modification of tasks or  assist with assess necessary to complete an evaluation.  OT OCCUPATIONAL PROFILE AND HISTORY: Detailed assessment: Review of records and additional review of physical, cognitive, psychosocial history related to current functional performance.  CLINICAL DECISION MAKING: Moderate - several treatment options, min-mod task modification necessary  REHAB POTENTIAL: Good  EVALUATION COMPLEXITY: Moderate    PLAN:  OT FREQUENCY: 2x/week  OT DURATION: 12 weeks  PLANNED INTERVENTIONS: 81191 OT Re-evaluation, 97535 self care/ADL training, 47829  therapeutic exercise, 97530 therapeutic activity, 97112 neuromuscular re-education, 97140 manual therapy, 97018 paraffin, 60454 moist heat, 97010 cryotherapy, 97034 contrast bath, passive range of motion, coping strategies training, patient/family education, and DME and/or AE instructions  RECOMMENDED OTHER SERVICES: PT, ST  CONSULTED AND AGREED WITH PLAN OF CARE: Patient  PLAN FOR NEXT SESSION: see above  Danelle Earthly, MS, OTR/L  01/02/2024, 10:33 AM

## 2024-01-04 ENCOUNTER — Ambulatory Visit: Payer: Medicare Other | Admitting: Speech Pathology

## 2024-01-04 ENCOUNTER — Ambulatory Visit: Payer: Medicare Other | Admitting: Physical Therapy

## 2024-01-04 ENCOUNTER — Ambulatory Visit: Payer: Medicare Other | Admitting: Occupational Therapy

## 2024-01-04 DIAGNOSIS — M6281 Muscle weakness (generalized): Secondary | ICD-10-CM

## 2024-01-04 DIAGNOSIS — R278 Other lack of coordination: Secondary | ICD-10-CM

## 2024-01-04 DIAGNOSIS — R482 Apraxia: Secondary | ICD-10-CM

## 2024-01-04 DIAGNOSIS — R41841 Cognitive communication deficit: Secondary | ICD-10-CM

## 2024-01-04 DIAGNOSIS — R2681 Unsteadiness on feet: Secondary | ICD-10-CM

## 2024-01-04 DIAGNOSIS — R269 Unspecified abnormalities of gait and mobility: Secondary | ICD-10-CM | POA: Diagnosis not present

## 2024-01-04 DIAGNOSIS — R4701 Aphasia: Secondary | ICD-10-CM

## 2024-01-04 DIAGNOSIS — R2689 Other abnormalities of gait and mobility: Secondary | ICD-10-CM

## 2024-01-04 DIAGNOSIS — I63512 Cerebral infarction due to unspecified occlusion or stenosis of left middle cerebral artery: Secondary | ICD-10-CM

## 2024-01-04 DIAGNOSIS — R262 Difficulty in walking, not elsewhere classified: Secondary | ICD-10-CM

## 2024-01-04 NOTE — Therapy (Signed)
 OUTPATIENT PHYSICAL THERAPY NEURO TREATMENT  Patient Name: Lindsey [name] MRN: [id number] DOB:08-02-1956, 68 y.o., female Today's Date: 01/04/2024   PCP: Emogene Morgan, MD  REFERRING PROVIDER: Horton Chin, MD   END OF SESSION:   PT End of Session - 01/04/24 0803     Visit Number 12    Number of Visits 24    Date for PT Re-Evaluation 02/06/24    Progress Note Due on Visit 20    PT Start Time 0801    PT Stop Time 0845    PT Time Calculation (min) 44 min    Equipment Utilized During Treatment Gait belt    Activity Tolerance Patient tolerated treatment well    Behavior During Therapy WFL for tasks assessed/performed               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   REFERRING DIAG:  Diagnosis  I69.398,R25.2 (ICD-10-CM) - Spasticity as late effect of cerebrovascular accident (CVA)    THERAPY DIAG:  Muscle weakness (generalized)  Other lack of coordination  Acute ischemic left MCA stroke (HCC)  Abnormality of gait and mobility  Unsteadiness on feet  Difficulty in walking, not elsewhere classified  Other abnormalities of gait and mobility  Rationale for Evaluation and Treatment: Rehabilitation  SUBJECTIVE:  SUBJECTIVE STATEMENT:  Patient reports doing well overall and no new issues-  States no falls and no pain. Daughter present on this day. States that she is doing well at  home.   From EVAL: Daughter present for Evaluation to provide hx. Pt is familiar to this clinic and has been seen for multiple bouts PT since initial CVA in 2021. Pt d/c'ed from PT serviced in April of 2024 with plans to receive PT services through [facility]. Daughter reports that she was only receiving minimal therapy in facility. Reports at least 1 fall in the last 6 months and daughter asked staff to transfer pt with lift since fall.  Daughter would want pt to demonstrate improve safety with transfers to allow bil transfers with reduced use of rail to allow pt to return home for assisted living.  Unable to discern whether pt in SNF or assisted living care from hx.    Pt accompanied by: self and family member   PERTINENT HISTORY:  1. Acute Ischemic Left MCA Stroke:Right Hemiplegia: Ordered outpatient OT, PT, and SLP. She has had a HFU appointment with Neurology. Discussed that I can provide new scripts next month if needed. Communicated with her providers regarding positive reinforcement. She has made excellent gains in strength as well as cognition and speech!    -Provided with new disability placard.    2. Essential Hypertension: BP is currently very elevated and discussed with patient that it would be unsafe for her to receive injections today as the injections can temporarily increase her pain, which can increase her blood pressure, and put her at risk for stroke. Discussed starting the medication Valsartan 40mg  daily and logging blood pressures daily at home and she is agreeable. Medications reviewed and she is taking Amlodipine 5mg  and lopressor.  Provided with a list of foods that will help lower her BP.  BP reviewed and improved at 136/82 -continue clonidine -Advised checking BP daily at home and logging results to bring into follow-up appointment with PCP and myself. -Reviewed BP meds today.  -Advised regarding healthy foods that can help lower blood pressure and provided with a list:  PAIN:  Are you having pain? No  PRECAUTIONS: Fall  RED FLAGS: None   WEIGHT BEARING RESTRICTIONS: No  FALLS: Has patient fallen in last 6 months? Yes. Number of falls 1  LIVING ENVIRONMENT: Lives with: lives with their family and lives in a skilled nursing facility Lives in: Other SNF  Stairs: No Has following equipment at home: Hemi walker and Wheelchair (manual)  PLOF: Requires assistive device for independence, Needs assistance with ADLs, Needs assistance with homemaking, and WC level currently   PATIENT GOALS: move better - walking or transferring with hemi walking   OBJECTIVE:  Note: Objective measures were completed at Evaluation unless otherwise noted.  DIAGNOSTIC FINDINGS:  Head CT:  IMPRESSION: 1. No acute intracranial abnormality. 2. No acute displaced fracture or traumatic listhesis of the cervical spine.  Cervical CT:  MPRESSION: 1. No acute intracranial abnormality. 2. No acute displaced fracture or traumatic listhesis of the cervical spine.  COGNITION: Overall cognitive status: History of cognitive impairments - at  baseline   SENSATION: Light touch: Impaired  Proprioception: Impaired  Able to detect depe pressure   COORDINATION: Increased tone on the RLE   EDEMA:  Mild RLE distal edema   MUSCLE TONE: RLE: Mild and Moderate  MUSCLE LENGTH: Hamstrings: grossly limited    POSTURE: rounded shoulders, forward head, and posterior bias   LOWER EXTREMITY  ROM:     Active  Right Eval Left Eval  Hip flexion  Limited to 5 deg beyond 90 in sitting  Knee flexion  90deg in sitting  Knee extension  Lacking 10 deg full extension  Ankle dorsiflexion  none  Ankle plantarflexion  none   (Blank rows = not tested)  LOWER EXTREMITY MMT:    MMT Right Eval Left Eval  Hip flexion  2+  Hip extension    Hip abduction  2  Hip adduction  3  Hip internal rotation    Hip external rotation    Knee flexion  2+  Knee extension  3+  Ankle dorsiflexion  0  Ankle plantarflexion  0  Ankle inversion    Ankle eversion    (Blank rows = not tested)  BED MOBILITY:  Sit to supine Mod A Supine to sit Mod A Rolling to Right Min A Rolling to Left Min A  TRANSFERS: Assistive device utilized: Hemi walker  Sit to stand: Mod A Stand to sit: Mod A Chair to chair: Mod A Floor:  unable to perform     CURB:  Level of Assistance: Total A Assistive device utilized:  rail Curb Comments: unable to perform   STAIRS: Unable to perform at eval   GAIT: Gait pattern:  non-functional constant posterior bias , step to pattern, decreased stance time- Right, Right hip hike, lateral hip instability, and lateral lean- Left Distance walked: 8ft Assistive device utilized: Hemi walker Level of assistance: Mod A Comments: moderate cues for sequencing of HW and gait pattern in turns   FUNCTIONAL TESTS:  5 times sit to stand: 48 sec with mod Assist from PT  Timed up and go (TUG): unable to perform  10 meter walk test: 6 ft with min-mod assist and HW.  FIST:  Function In Sitting Test (FIST)  (1/2 femur on surface;  hips/knees flexed to 90deg)   - indicate bed or mat table / step stool if used  SCORING KEY: 4 = Independent (completes task independently & successfully) 3 = Verbal Cues/Increased Time (completes task independently & successfully and only needs more time/cues) 2 = Upper Extremity Support (must use UE for support or assistance to complete successfully) 1 = Needs Assistance (unable to complete w/o physical assist; DOCUMENT LEVEL: min, mod, max) 0 = Dependent (requires complete physical assist; unable to complete successfully even w/ physical assist)  Randomly Administer Once Throughout Exam  4 - Anterior Nudge (superior sternum)  4 - Posterior Nudge (between scapular spines)  4 - Lateral Nudge (to dominant side at acromion)     4 - Static sitting (30 seconds)  4 - Sitting, shake 'no' (left and right)  4 - Sitting, eyes closed (30 seconds)   3 - Sitting, lift foot (dominant side, lift foot 1 inch twice)    4 - Pick up object from behind (object at midline, hands breadth posterior)  1 mod assist - Forward reach (use dominant arm, must complete full motion) 1 mod assist - Lateral reach (use dominant arm, clear opposite ischial tuberosity) 1 mod assist  - Pick up object from floor (from between feet)   1 mod assist - Posterior scooting (move backwards 2 inches)  1 min assist  - Anterior scooting (move forward 2 inches)  1 min assist  - Lateral scooting (move to dominant side 2 inches)    TOTAL = 37/56  Notes/comments: apraxia limiting success of scooting and reacting tasks.  MCD > 5 points MCID for IP  REHAB > 6 points      PATIENT SURVEYS:  To be completed                                                                                                                               TREATMENT DATE: 01/04/2024  Unless otherwise stated, min A was provided and gait belt donned in order to ensure pt safety throughout session.  Sit<>stand x10 +4 with HW cues for sequencing and  improved anterior weight shift and sequencing.   Standing with RW for force WB through RUE 2 x 30 se  Weight shift in standing with RW x 12 bil  Forwards step with RW x 10 bil  Hip abduction with BUE supported on rail   Side stepping with BUE support on rail 4 x 34ft each   Gait with HW x 42ft with min/mod assist due intermittent posterior bias and improved weight shift to the L with fatigue and in turns. Improved sequencing compared to prior session.   Reciprocal reverse step with HW x 10 bil mod assist from PT for weight shift and full ROM on the Rside   R quad stretch 3 x 45 sec with increased overpressure as pt allows.     PATIENT EDUCATION: Education details: POC.  Pt educated throughout session about proper posture and technique with exercises. Improved exercise technique, movement at target joints, use of target muscles after min to mod verbal, visual, tactile cues.   Person educated: Patient and Child(ren) Education method: Explanation Education comprehension: verbalized understanding  HOME EXERCISE PROGRAM: Access Code: [email or web address] URL: [email or web address] Date: 12/05/2023 Prepared by: Grier Rocher  Exercises - Sit to Stand with Counter Support  - 1 x daily - 3-4 x weekly - 2 sets - 10 reps - Seated Long Arc Quad  - 1 x daily - 3-4 x weekly - 2 sets - 10 reps - 2 hold - Seated March  - 1 x daily - 3-4 x weekly - 2 sets - 10 reps - 2 hold - Standing Hip Abduction with Counter Support  - 1 x daily - 7 x weekly - 3 sets - 5 reps - Wide Stance with Counter Support  - 1 x daily - 7 x weekly - 3 sets - 2 reps - 30 hold - Seated Hip Abduction  - 1 x daily - 7 x weekly - 3 sets - 10 reps - Supine Bridge  - 1 x daily - 7 x weekly - 3 sets - 10 reps - Supine Short Arc Quad  - 1 x daily - 7 x weekly - 3 sets - 10 reps - Bent Knee Fallouts  - 1 x daily - 7 x weekly - 3 sets - 10 reps - Small Range Straight Leg Raise  - 1 x daily - 7 x weekly - 3 sets - 10 reps -  Supine Heel Slide  - 1 x daily - 7 x weekly - 3  sets - 10 reps   GOALS: Goals reviewed with patient? Yes  SHORT TERM GOALS: Target date: 12/13/2023    Patient will be independent in home exercise program to improve strength/mobility for better functional independence with ADLs. Baseline: to be provided on visit 2: 12/28/2023- No caregiver with patient to determine if compliant with HEP Goal status: INITIAL   LONG TERM GOALS: Target date: 02/07/2024     2.  Patient (> 49 years old) will complete five times sit to stand test by 15 seconds and only min assist indicating an increased LE strength and improved balance. Baseline: 48 with mod assist from PT; 12/28/2023= 44 sec from EOM at lowest postion (approx 21 in) with Left UE support and CGA to min A from PT Goal status: Progressing 3.  Patient will increase FIST score by > 6 points to demonstrate decreased fall risk during functional activities Baseline: 37 Goal status: INITIAL  4.  Patient will complete 10 meter walk test to with HW in less than 1 minute with min assist as to improve gait speed for better community ambulation and to reduce fall risk. Baseline: 6 ft with HW and mod assist; 12/28/2023=18 feet with HW- mod assist  Goal status: PROGRESSING  5.  Patient will perform stand pivot transfer to and from [facility] with HW and CGA for safety to allow safe transfer to toilet with daughter without use of rail on the wall. Baseline: mod assist and heavy use of rail. 12/28/2023= Min to Mod assist with repetitive VC for technique Goal status: Progressing  6.  Patient will improve bed mobility to min assist only to reduce care giver burden and improve safety of transfers  Baseline: mod-max assist Goal status: INITIAL  ASSESSMENT:  CLINICAL IMPRESSION:  Patient presents with good motivation for assessment visit. PT treatmeant focused on improved balance, posture, weight shift, and ROM in the RLE. Increased distance with gait training on this  day and increased demand to BLE movements in all directions to increase hip extension and knee flexion on the R side for tone management and safety with turns. Pt will benefit from continued skilled PT to address strength and balance deficits to improve safety for transfers, reduce care giver burden, and allow return to PLOF and possible return home from Assisted living.   OBJECTIVE IMPAIRMENTS: Abnormal gait, cardiopulmonary status limiting activity, decreased activity tolerance, decreased balance, decreased cognition, decreased coordination, decreased endurance, decreased knowledge of use of DME, decreased mobility, difficulty walking, decreased ROM, decreased strength, hypomobility, increased muscle spasms, impaired flexibility, impaired sensation, impaired tone, impaired UE functional use, improper body mechanics, and postural dysfunction.   ACTIVITY LIMITATIONS: carrying, lifting, bending, standing, squatting, stairs, transfers, bed mobility, continence, bathing, toileting, dressing, reach over head, hygiene/grooming, and locomotion level  PARTICIPATION LIMITATIONS: cleaning, interpersonal relationship, shopping, and community activity  PERSONAL FACTORS: 3+ comorbidities: HTN, chronic CVA, Afib, Lupus   are also affecting patient's functional outcome.   REHAB POTENTIAL: Fair chronic CVA with poor progress in prior interventions with this clinic  CLINICAL DECISION MAKING: Unstable/unpredictable  EVALUATION COMPLEXITY: High  PLAN:  PT FREQUENCY: 1-2x/week  PT DURATION: 12 weeks  PLANNED INTERVENTIONS: 97110-Therapeutic exercises, 97530- Therapeutic activity, [id number]- Neuromuscular re-education, 97535- Self Care, 16109- Manual therapy, [phone]- Gait training, [phone]- Orthotic Fit/training, [phone]- Aquatic Therapy, [phone]- Splinting, 97014- Electrical stimulation (unattended), [phone]- Electrical stimulation (manual), Patient/Family education, Balance training, Stair training, Joint mobilization,  Vestibular training, Visual/preceptual remediation/compensation, Cognitive remediation, DME instructions, Wheelchair mobility training, Cryotherapy, and Moist heat  PLAN FOR NEXT  SESSION:   - stretching of quads for tone management to improve knee flexion ROM to allow proper foot positioning underneath BOS during transfers - stretching hip flexors to improve upright posture in standing -  R glute activation and strength during standing  Continue  standing balance. Gait. Transfers.    Grier Rocher PT, DPT  Physical Therapist - [facility]  8:04 AM 01/04/24

## 2024-01-04 NOTE — Therapy (Addendum)
 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) 09/24/2021   Cerebral embolism with cerebral infarction 09/23/2021   Pyelonephritis 09/19/2021   Pain due to onychomycosis of toenails of both feet 11/19/2020   Normocytic anemia 05/31/2020   Acute ischemic left MCA stroke (HCC) 04/30/2020   Right hemiplegia (HCC) 04/30/2020   Cerebrovascular accident (HCC) 04/21/2020   Atrial fibrillation with RVR (HCC) 04/21/2020   Essential hypertension 04/21/2020   Alcohol abuse 04/21/2020   ONSET DATE: 09/19/21  REFERRING DIAG: CVA  THERAPY DIAG:  Muscle weakness (generalized)  Rationale for  Evaluation and Treatment: Rehabilitation  SUBJECTIVE:  SUBJECTIVE STATEMENT: Pt acknowledged doing well today. Pt accompanied by: self, grandson  PERTINENT HISTORY: Pt. is a 68 y.o. 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 Pt. has Right sided weakness, and receptive, and expressive aphasia. Pt. has had a previous CVA with right sided weakness in July 2021. PMHx includes: AFib, Renal Artery thrombosis, situational depression, HTN, Pyelnephritis, Seizure activity, COnstipation, AKI. Vitamin D deficiency, and urinary incontinence   PRECAUTIONS: None  WEIGHT BEARING RESTRICTIONS: No  PAIN:  Are you having pain? 2/10 FACES pain in the right shoulder   FALLS: Has patient fallen in last 6 months? Yes. Number of falls 1-ER visit  LIVING ENVIRONMENT: Pt. resides at [facility]. Pt. has very supportive family Stairs: One level Has following equipment at home:  Equipment provided through [employer]  PLOF: Needs assistance with ADLs  PATIENT GOALS:  To improve ROM/flexibility in the RUE 2/2 increased tightness, and to improve ADL functioning in order to towards returning home.  OBJECTIVE:  Note: Objective measures were completed at Evaluation unless otherwise noted.  HAND DOMINANCE: Right  ADLs: Overall ADLs: Assist is provided from the staff at [facility]. Transfers/ambulation related to ADLs: Eating: independent, with set-up using the left hand Grooming: Independent with set-up using the left hand UB Dressing: Increased assist required, Mod-MaxA doffing jacket LB Dressing: Increased assist  required Toileting:  Staff Assists with toileting transfers, and toilet hygiene care. Bathing: Staff assists with bathing Tub Shower transfers: Requires staff assist Equipment: TBD  IADLs: Shopping: N/A Light housekeeping: Provided by staff Meal Prep: 3 Meals a day provided. Pt. Eats in the main dining room  when family visits, otherwise Pt. Eats in her room.  Community mobility: Relies on family for transportation to therapy. Per daughter- Transportation is provided for services not offered at the facility. Medication management: Provided by the facility Financial management: Daughter manages Handwriting: TBD  MOBILITY STATUS: Hx of falls  FUNCTIONAL OUTCOME MEASURES:   UPPER EXTREMITY ROM:    Passive ROM Left Eval AROM WFL Right Eval  Right 01/02/24  Shoulder flexion  0(46) 25 (90)  Shoulder abduction  0(64) 0 (90)  Shoulder adduction     Shoulder extension     Shoulder internal rotation     Shoulder external rotation   (25)  Elbow flexion  0(98) 0(131)  Elbow extension  0(full passive) 0 (full passive)  Wrist flexion  0(56) 0(90)  Wrist extension  -56(30) 0 (40)  Wrist ulnar deviation     Wrist radial deviation     Wrist pronation     Wrist supination     (Blank rows = not tested)  UPPER EXTREMITY MMT:     MMT Left Eval 5/5 Right eval Right  01/02/24  Shoulder flexion  0/5 2/5   Shoulder abduction  0/5 1/5  Shoulder adduction   2-/5  Shoulder extension     Shoulder internal rotation   1/5  Shoulder external rotation     Middle trapezius     Lower trapezius     Elbow flexion  0/5 0/5  Elbow extension  0/5 2/5  Wrist flexion   1/5  Wrist extension  0/5 3-/5  Wrist ulnar deviation     Wrist radial deviation     Wrist pronation  0/5 0/5  Wrist supination  0/5 0/5  Digit flexors   2-/5  Digit extensors   0/5  (Blank rows = not tested)  HAND FUNCTION: N/A  COORDINATION: N/A  SENSATION: Diminished  EDEMA: No edema noted in the right hand today  MUSCLE TONE: impaired  COGNITION: Overall cognitive status: Aphasia  VISION: Wears glasses, no change from baseline  PERCEPTION: TBD  PRAXIS: Impaired: Motor planning                                                                                                                           TREATMENT DATE:  01/04/24  Moist heat modality to the right shoulder, elbow, forearm, and wrist prior to Manual therapy, and there. Ex./ROM.    Manual therapy:   -Soft tissue massage to the scapular, and right shoulder musculature. -Soft tissue mobilizations to promote radius on ulna motion needed for supination.  -Manual therapy was performed independent of, and in preparation for therapeutic Ex.     There. Ex:   -PROM/AAROM through  all joint ranges of the RUE for shoulder flexion, abduction, horizontal abduction, elbow flexion, extension, forearm supination, wrist extension, digit extension, and thumb abduction.  -Education was provided about self-PROM with hand-over-hand assist   Self-care:   - ModA doffing a light jacket - modA donning the jacket with step-by-step cues    PATIENT EDUCATION: Education details: Progress towards goals Person educated: Patient Education method: Explanation, Demonstration, Tactile cues, and Verbal cues Education comprehension: verbal cues required, tactile cues required, and needs further education, tactile cues  HOME EXERCISE PROGRAM: RUE self PROM  GOALS: Goals reviewed with patient? Yes  SHORT TERM GOALS: Target date: 01/16/2024    Pt. Will demonstrate supervision with HEPs for BUEs Baseline: Eval: No current HEPs; 01/02/24: Pt demos self PROM techniques throughout the RUE with intermittent min A and mod vc for accuracy Goal status: in progress  LONG TERM GOALS: Target date: 02/07/2024    1.  Pt. Will improve right shoulder flexion to 45* to assist with UE dressing Baseline: Eval: Right: 0(46); 01/02/24: Right: 25 (90) Goal status: revised on 01/02/24 d/t 20* increase met/ in progress  2.  Pt. Will improve with shoulder abduction by 20 degrees to assist with underarm care, and hygiene Baseline: Eval: Right 0(64); 01/02/24: Right 0 (90) Goal status: achieved  3.  Pt. Will improve right elbow flexion by 10 degrees to assist with hand to face patterns for  grooming Baseline: Eval: Right: 0(98); 01/02/24: Right: 0(131) Goal status: in progress  4.  Pt. Will elicit active wrist extension, and digit extension in preparation for rubbing lotion into the left forearm, and hand  Baseline: Eval: Right wrist: -56 (30); 01/02/24: R wrist: ext: actively to neutral/0 (passively to 40*); R digit extension: no active ext (passively [facility] for this task); unable to self apply lotion to L forearm Goal status: in progress  5.  Pt. Will perform UE dressing skills with MinA Baseline: Eval: Mod-MaxA doffing a jacket; 01/02/24: Mod A to don/doff a jacket Goal status: in progress  6. Pt. Will require minA toileting transfers/ clothing management Baseline: Eval: Pt. Requires increased assist from staff; 01/02/24: pt can transfer to standard height toilet with grab bar for support with min A, but requires mod A to transfer up from toilet; Pt consistently performs wc<>BSC transfer with min A, extra time, and vc for sequencing while using hemi walker; total A for clothing management.  Goal status: in progress  ASSESSMENT:  CLINICAL IMPRESSION:  Pt. presents with improving ROM in the RUE, and hand. Pt. continues to tolerate moist heat, manual therapy, and ROM well today. Pt. required less cues and minA assist to donn the light jacket. The most cuing was required to pull the jacket all the way to the top of the right shoulder. Pt. required assist to stabilize the shirt sleeve while placing it in the jacket. Pt continues to present with increased flexor tone, and tightness proximally through the scapular, and shoulder musculature, as well as through the wrist, and digits, and presents with strong extensor tone in the right elbow.  Pt. continues to benefit from OT services to normalize tone in the dominant RUE, improve ROM for underarm hygiene care and UE dressing, as well as to improve toileting care, and clothing negotiation tasks.     PERFORMANCE DEFICITS: in functional skills  including ADLs, IADLs, coordination, dexterity, proprioception, sensation, ROM, strength, pain, Fine motor control, Gross motor control, balance, body mechanics, decreased knowledge of use of DME, and UE functional use, cognitive skills  including problem solving, and psychosocial skills including coping strategies, environmental adaptation, and routines and behaviors.   IMPAIRMENTS: are limiting patient from ADLs, IADLs, and leisure.   CO-MORBIDITIES: may have co-morbidities  that affects occupational performance. Patient will benefit from skilled OT to address above impairments and improve overall function.  MODIFICATION OR ASSISTANCE TO COMPLETE EVALUATION: Min-Moderate modification of tasks or assist with assess necessary to complete an evaluation.  OT OCCUPATIONAL PROFILE AND HISTORY: Detailed assessment: Review of records and additional review of physical, cognitive, psychosocial history related to current functional performance.  CLINICAL DECISION MAKING: Moderate - several treatment options, min-mod task modification necessary  REHAB POTENTIAL: Good  EVALUATION COMPLEXITY: Moderate    PLAN:  OT FREQUENCY: 2x/week  OT DURATION: 12 weeks  PLANNED INTERVENTIONS: 97168 OT Re-evaluation, 97535 self care/ADL training, 28413 therapeutic exercise, 97530 therapeutic activity, 97112 neuromuscular re-education, 97140 manual therapy, 97018 paraffin, 24401 moist heat, 97010 cryotherapy, 97034 contrast bath, passive range of motion, coping strategies training, patient/family education, and DME and/or AE instructions  RECOMMENDED OTHER SERVICES: PT, ST  CONSULTED AND AGREED WITH PLAN OF CARE: Patient  PLAN FOR NEXT SESSION: see above  Olegario Messier, MS, OTR/L   01/04/2024, 9:36 AM

## 2024-01-04 NOTE — Therapy (Signed)
 OUTPATIENT SPEECH LANGUAGE PATHOLOGY  TREATMENT NOTE   Patient Name: Lindsey [name] MRN: [id number] DOB:1956/02/29, 68 y.o., female Today's Date: 01/05/2024  PCP: Karie Fetch, MD REFERRING PROVIDER: Sula Soda, MD    End of Session - 01/04/24 1010     Visit Number 9    Number of Visits 17    Date for SLP Re-Evaluation 01/11/24    Authorization Type Medicare Part A/ Medicare Part B    Progress Note Due on Visit 10    SLP Start Time 0845    SLP Stop Time  0930    SLP Time Calculation (min) 45 min    Activity Tolerance Patient tolerated treatment well               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) 09/24/2021   Cerebral embolism with cerebral infarction 09/23/2021   Pyelonephritis 09/19/2021   Pain due to onychomycosis of toenails of both feet 11/19/2020   Normocytic anemia 05/31/2020   Acute ischemic left MCA stroke (HCC) 04/30/2020   Right hemiplegia (HCC) 04/30/2020   Cerebrovascular accident (HCC) 04/21/2020   Atrial fibrillation with RVR (HCC) 04/21/2020   Essential hypertension 04/21/2020   Alcohol abuse 04/21/2020    ONSET DATE: 04/30/2020 initial CVA; 10/25/2020  second CVA;  11/07/2023 date of  referral  REFERRING DIAG: R47.01 (ICD-10-CM) - Aphasia   THERAPY DIAG:  Aphasia  Apraxia  Cognitive communication deficit  Rationale for Evaluation and Treatment Rehabilitation  SUBJECTIVE:   PERTINENT HISTORY and DIAGNOSTIC FINDINGS:   Patient is a 68 y.o. female with the following prior history:  MRI 04/21/2020 Brain: Acute infarct left MCA territory. Infarct involves the left frontal lobe, insula as well as the caudate and putamen. There are areas of hemorrhage within the caudate and putamen as well as in the left frontal lobe.  Scattered small white matter hyperintensities elsewhere compatible with chronic microvascular ischemia. Ventricle size normal. Negative for mass lesion.  Inpatient Rehabilitation ST Services: 04/2020 thru 05/2020 Outpatient ST services 08/2020 thru 08/2021 Pt discharged with Lingraphica Device; per chart using device to assist with communication  MRI 09/22/2021 IMPRESSION: 1. Acute moderate size Left AICA cerebellar infarct. Petechial hemorrhage but no malignant hemorrhagic transformation. Cytotoxic edema but no posterior fossa mass effect at this time.  2. Underlying extensive chronic left MCA territory encephalomalacia related to the 2021 infarct. Associated left brainstem Wallerian degeneration since last year. And small superimposed chronic Left PICA cerebellar infarcts, but also new since last year.   Inpatient Rehabilitation ST Services at [facility]: 10/02/2021 thru 10/21/2021 Outpatient ST Services [facility] 10/2021 thru 03/08/2022 Pt discharged d/t reaching maximal rehabilitation potential; "3/4 LTGs particially met due to limited caregiver  availability"  Pt is currently residing at a SNF [facility] Additional medical history includes alcoholism,  AFib, Renal Artery thrombosis, situational depression, HTN, Pyelnephritis, Seizure activity, Constipation, AKI. Vitamin D deficiency, and urinary incontinence.   PAIN:  Are you having  pain? No  FALLS: Has patient fallen in last 6 months?  See PT evaluation for details  LIVING ENVIRONMENT: Lives with: lives with their family and lives in a skilled nursing facility   PLOF:  Level of assistance: Needed assistance with ADLs, Needed assistance with IADLS   PATIENT GOALS    pt unable to state - her daughter reports she would like for pt to be able to "communicate basics, return to abilities before second stroke (09/2021)"  SUBJECTIVE STATEMENT: Pt arrived eager with TouchTalk device, accompanied by her daughter Pt accompanied by: daughter  OBJECTIVE:   TODAY'S TREATMENT:     Skilled treatment session focused on her communication goals. SLP facilitated the session by providing the following interventions:  Unable to pull up TalkPath on pt's TouchTalk - Education provided to pt's daughter on location of icon and pt's apparent enjoyment of using device vs worksheets - this Clinical research associate also backed up pt's device and will seek information on potential    Word ID - level 2 - 16 out of 20 improving to 20 out of 20 with minimal cues - pt with increased identification of her errors - also her choices appeared improved with device centered more to her left - in this position she was better able to scan to the choice on far right Sentence Completion - level 1  with minimal verbal cues (reading the sentence and choices) pt able to complete correctly 18 out of 20  PATIENT EDUCATION: Education details: use of low tech communication  Person educated: Patient and Child(ren) Education method: Explanation Education comprehension: needs further education  HOME EXERCISE PROGRAM:   TalkPath Therapy App Increase her involvement in activities provided by facility   GOALS:  Goals reviewed with patient? Yes  SHORT TERM GOALS: Target date: 10 sessions  Given verbal or visual prompt, pt will correctly ID written word/object/icon in field of 4-6 to communicate wants/needs with 75%  accuracy given occasional mod A over 2 sessions  Baseline: Goal status: INITIAL   LONG TERM GOALS: Target date: 01/11/2024  Pt will utilize multimodal communication to express basic wants/needs as reported by pt and her daughter in 3 out of 5 opportunities.  Baseline:  Goal status: INITIAL   ASSESSMENT:  CLINICAL IMPRESSION: Patient is a 68 y.o. right handed female who was seen today for a speech language treatment d/t history of Left MCA CVA. 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 Her verbal language Is halting and while she does produce some islands of improved speech intelligibility during naming tasks and spontaneous responses, she speech contains neologisms with evidence of word finding difficulty. Pt's daughter reports that pt doesn't use her Lingraphica device "as much as she should" and it is only available to her during weekends at home for fear that it might be lost at pt's facility.   Pt appeared to really enjoy using TouchTalk for accessing TalkPath Therapy exercises. Education also provided on increasing her socialization with other residents and engaging in activities within facility.  See the above treatment note for details.   OBJECTIVE IMPAIRMENTS include attention, aphasia, apraxia, and dysarthria. These impairments are limiting patient from effectively communicating at  home and in community. Factors affecting potential to achieve goals and functional outcome are ability to learn/carryover information, co-morbidities, cooperation/participation level, medical prognosis, previous level of function, severity of impairments, family/community support, and time post onset . Patient will benefit from skilled SLP services to address above impairments and improve overall function.  REHAB POTENTIAL: Fair time post onset, multiple courses of ST services, decreased carryover of  therapy concepts during previous sessions; pt discharged from previous course of ST services (2023) requiring moderate assistance for communication  PLAN: SLP FREQUENCY: 1-2x/week  SLP DURATION: 8 weeks  PLANNED INTERVENTIONS: Language facilitation, Internal/external aids, Functional tasks, Multimodal communication approach, SLP instruction and feedback, Compensatory strategies, Patient/family education, and [id number] Treatment of speech (30 or 45 min)     Lee Kuang B. Dreama Saa, M.S., CCC-SLP, Tree surgeon Certified Brain Injury Specialist [facility]  [facility] Rehabilitation Services Office [phone] Ascom [phone] Fax [phone]

## 2024-01-09 ENCOUNTER — Ambulatory Visit: Payer: Medicare Other | Admitting: Speech Pathology

## 2024-01-09 ENCOUNTER — Ambulatory Visit: Payer: Medicare Other

## 2024-01-09 ENCOUNTER — Ambulatory Visit: Payer: Medicare Other | Admitting: Physical Therapy

## 2024-01-09 DIAGNOSIS — R4701 Aphasia: Secondary | ICD-10-CM

## 2024-01-09 DIAGNOSIS — M6281 Muscle weakness (generalized): Secondary | ICD-10-CM

## 2024-01-09 DIAGNOSIS — R269 Unspecified abnormalities of gait and mobility: Secondary | ICD-10-CM | POA: Diagnosis not present

## 2024-01-09 DIAGNOSIS — R482 Apraxia: Secondary | ICD-10-CM

## 2024-01-09 DIAGNOSIS — R278 Other lack of coordination: Secondary | ICD-10-CM

## 2024-01-09 DIAGNOSIS — R2689 Other abnormalities of gait and mobility: Secondary | ICD-10-CM

## 2024-01-09 DIAGNOSIS — I63542 Cerebral infarction due to unspecified occlusion or stenosis of left cerebellar artery: Secondary | ICD-10-CM

## 2024-01-09 DIAGNOSIS — R262 Difficulty in walking, not elsewhere classified: Secondary | ICD-10-CM

## 2024-01-09 DIAGNOSIS — R2681 Unsteadiness on feet: Secondary | ICD-10-CM

## 2024-01-09 NOTE — Therapy (Unsigned)
 OUTPATIENT OCCUPATIONAL THERAPY NEURO TREATMENT NOTE   Patient Name: Lindsey [name] MRN: [id number] DOB:09-02-1956, 68 y.o., female Today's Date: 01/10/2024  PCP: Emogene Morgan, MD REFERRING PROVIDER: Sula Soda, MD    OT End of Session - 01/10/24 1609     Visit Number 12    Number of Visits 24    Date for OT Re-Evaluation 02/06/24    Progress Note Due on Visit 10    OT Start Time 0810    OT Stop Time 0843    OT Time Calculation (min) 33 min    Activity Tolerance Patient tolerated treatment well    Behavior During Therapy [facility] for tasks assessed/performed            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) 09/24/2021   Cerebral embolism with cerebral infarction 09/23/2021   Pyelonephritis 09/19/2021   Pain due to onychomycosis of toenails of both feet 11/19/2020   Normocytic anemia 05/31/2020   Acute ischemic left MCA stroke (HCC) 04/30/2020   Right hemiplegia (HCC) 04/30/2020   Cerebrovascular accident (HCC) 04/21/2020   Atrial fibrillation with RVR (HCC) 04/21/2020   Essential hypertension 04/21/2020   Alcohol abuse 04/21/2020   ONSET DATE: 09/19/21  REFERRING DIAG: CVA  THERAPY DIAG:  Muscle weakness  (generalized)  Other lack of coordination  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 Pt Pt accompanied by: self, daughter  PERTINENT HISTORY: Pt. is a 68 y.o. 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 Pt. has Right sided weakness, and receptive, and expressive aphasia. Pt. has had a previous CVA with right sided weakness in July 2021. PMHx includes: AFib, Renal Artery thrombosis, situational depression, HTN, Pyelnephritis, Seizure activity, COnstipation, AKI. Vitamin D deficiency, and urinary incontinence   PRECAUTIONS: None  WEIGHT BEARING RESTRICTIONS: No  PAIN:  Are you having pain? 2/10 FACES pain in the right shoulder   FALLS: Has patient fallen in last 6 months? Yes. Number of falls 1-ER visit  LIVING ENVIRONMENT: Pt. resides at [facility]. Pt. has very supportive family Stairs: One level Has following equipment at home:  Equipment provided through [employer]  PLOF: Needs assistance with ADLs  PATIENT GOALS:  To improve ROM/flexibility in the RUE 2/2 increased tightness, and to improve ADL functioning in order to towards returning home.  OBJECTIVE:  Note: Objective measures were completed at Evaluation unless otherwise noted.  HAND DOMINANCE: Right  ADLs: Overall ADLs: Assist is provided from the staff  at [facility]. Transfers/ambulation related to ADLs: Eating: independent, with set-up using the left hand Grooming: Independent with set-up using the left hand UB Dressing: Increased assist required, Mod-MaxA doffing jacket LB Dressing: Increased assist required Toileting:  Staff Assists with toileting transfers, and toilet hygiene care. Bathing: Staff assists with  bathing Tub Shower transfers: Requires staff assist Equipment: TBD  IADLs: Shopping: N/A Light housekeeping: Provided by staff Meal Prep: 3 Meals a day provided. Pt. Eats in the main dining room when family visits, otherwise Pt. Eats in her room.  Community mobility: Relies on family for transportation to therapy. Per daughter- Transportation is provided for services not offered at the facility. Medication management: Provided by the facility Financial management: Daughter manages Handwriting: TBD  MOBILITY STATUS: Hx of falls  FUNCTIONAL OUTCOME MEASURES:  UPPER EXTREMITY ROM:    Passive ROM Left Eval AROM WFL Right Eval  Right 01/02/24  Shoulder flexion  0(46) 25 (90)  Shoulder abduction  0(64) 0 (90)  Shoulder adduction     Shoulder extension     Shoulder internal rotation     Shoulder external rotation   (25)  Elbow flexion  0(98) 0(131)  Elbow extension  0(full passive) 0 (full passive)  Wrist flexion  0(56) 0(90)  Wrist extension  -56(30) 0 (40)  Wrist ulnar deviation     Wrist radial deviation     Wrist pronation     Wrist supination     (Blank rows = not tested)  UPPER EXTREMITY MMT:     MMT Left Eval 5/5 Right eval Right  01/02/24  Shoulder flexion  0/5 2/5   Shoulder abduction  0/5 1/5  Shoulder adduction   2-/5  Shoulder extension     Shoulder internal rotation   1/5  Shoulder external rotation     Middle trapezius     Lower trapezius     Elbow flexion  0/5 0/5  Elbow extension  0/5 2/5  Wrist flexion   1/5  Wrist extension  0/5 3-/5  Wrist ulnar deviation     Wrist radial deviation     Wrist pronation  0/5 0/5  Wrist supination  0/5 0/5  Digit flexors   2-/5  Digit extensors   0/5  (Blank rows = not tested)  HAND FUNCTION: N/A  COORDINATION: N/A  SENSATION: Diminished  EDEMA: No edema noted in the right hand today  MUSCLE TONE: impaired  COGNITION: Overall cognitive status: Aphasia  VISION: Wears glasses, no change from  baseline  PERCEPTION: TBD  PRAXIS: Impaired: Motor planning                                                                                                                           TREATMENT DATE: 01/09/24  Manual therapy:  -Soft tissue massage to the scapular, and right shoulder musculature. -Soft tissue mobilizations to promote radius on ulna motion needed for supination.  -Manual therapy was performed independent of, and in preparation for  therapeutic Ex.     Therapeutic Exercise:  -PROM through the following joint ranges of the RUE, including shoulder flexion, abduction, ER, horizontal abduction, elbow flexion, extension, forearm supination, wrist extension, digit extension, and thumb abduction, working to increase RUE mobility to manage hemiparetic limb during basic ADLs, as well as reducing contracture risk. Barbaraann Boys for R shoulder flex/ext, horiz abd/add to work towards repositioning arm on/off lap and arm rest of wc. -Education was provided about self-PROM with hand-over-hand assist   PATIENT EDUCATION: Education details: RUE PROM/AAROM Person educated: Patient Education method: Explanation, Demonstration, Tactile cues, and Verbal cues Education comprehension: verbal cues required, tactile cues required, and needs further education  HOME EXERCISE PROGRAM: RUE self PROM  GOALS: Goals reviewed with patient? Yes  SHORT TERM GOALS: Target date: 01/16/2024    Pt. Will demonstrate supervision with HEPs for BUEs Baseline: Eval: No current HEPs; 01/02/24: Pt demos self PROM techniques throughout the RUE with intermittent min A and mod vc for accuracy Goal status: in progress  LONG TERM GOALS: Target date: 02/07/2024    1.  Pt. Will improve right shoulder flexion to 45* to assist with UE dressing Baseline: Eval: Right: 0(46); 01/02/24: Right: 25 (90) Goal status: revised on 01/02/24 d/t 20* increase met/ in progress  2.  Pt. Will improve with shoulder abduction by 20 degrees to assist  with underarm care, and hygiene Baseline: Eval: Right 0(64); 01/02/24: Right 0 (90) Goal status: achieved  3.  Pt. Will improve right elbow flexion by 10 degrees to assist with hand to face patterns for grooming Baseline: Eval: Right: 0(98); 01/02/24: Right: 0(131) Goal status: in progress  4.  Pt. Will elicit active wrist extension, and digit extension in preparation for rubbing lotion into the left forearm, and hand  Baseline: Eval: Right wrist: -56 (30); 01/02/24: R wrist: ext: actively to neutral/0 (passively to 40*); R digit extension: no active ext (passively [facility] for this task); unable to self apply lotion to L forearm Goal status: in progress  5.  Pt. Will perform UE dressing skills with MinA Baseline: Eval: Mod-MaxA doffing a jacket; 01/02/24: Mod A to don/doff a jacket Goal status: in progress  6. Pt. Will require minA toileting transfers/ clothing management Baseline: Eval: Pt. Requires increased assist from staff; 01/02/24: pt can transfer to standard height toilet with grab bar for support with min A, but requires mod A to transfer up from toilet; Pt consistently performs wc<>BSC transfer with min A, extra time, and vc for sequencing while using hemi walker; total A for clothing management.  Goal status: in progress  ASSESSMENT:  CLINICAL IMPRESSION: Shortened tx session this date d/t pt arriving late.  Pt tolerated manual therapy and therapeutic exercises well this date.  Good participation in trials of active assisted R shoulder ROM to work towards more independent RUE repositioning; pt required min A-mod A to move RUE on/off lap and intermittent tactile cues to avoid use of LUE during these movement attempts.  Pt continues to present with increased flexor tone, and tightness proximally through the scapular, and shoulder musculature, as well as through the wrist, and digits, and presents with strong extensor tone in the right elbow.  Pt. continues to benefit from OT services to normalize  tone in the dominant RUE, improve ROM for underarm hygiene care and UE dressing, as well as to improve toileting care, and clothing negotiation tasks.     PERFORMANCE DEFICITS: in functional skills including ADLs, IADLs, coordination, dexterity, proprioception, sensation, ROM, strength, pain, Fine  motor control, Gross motor control, balance, body mechanics, decreased knowledge of use of DME, and UE functional use, cognitive skills including problem solving, and psychosocial skills including coping strategies, environmental adaptation, and routines and behaviors.   IMPAIRMENTS: are limiting patient from ADLs, IADLs, and leisure.   CO-MORBIDITIES: may have co-morbidities  that affects occupational performance. Patient will benefit from skilled OT to address above impairments and improve overall function.  MODIFICATION OR ASSISTANCE TO COMPLETE EVALUATION: Min-Moderate modification of tasks or assist with assess necessary to complete an evaluation.  OT OCCUPATIONAL PROFILE AND HISTORY: Detailed assessment: Review of records and additional review of physical, cognitive, psychosocial history related to current functional performance.  CLINICAL DECISION MAKING: Moderate - several treatment options, min-mod task modification necessary  REHAB POTENTIAL: Good  EVALUATION COMPLEXITY: Moderate    PLAN:  OT FREQUENCY: 2x/week  OT DURATION: 12 weeks  PLANNED INTERVENTIONS: 97168 OT Re-evaluation, 97535 self care/ADL training, 09604 therapeutic exercise, 97530 therapeutic activity, 97112 neuromuscular re-education, 97140 manual therapy, 97018 paraffin, 54098 moist heat, 97010 cryotherapy, 97034 contrast bath, passive range of motion, coping strategies training, patient/family education, and DME and/or AE instructions  RECOMMENDED OTHER SERVICES: PT, ST  CONSULTED AND AGREED WITH PLAN OF CARE: Patient  PLAN FOR NEXT SESSION: see above  Danelle Earthly, MS, OTR/L   01/10/2024, 4:11 PM

## 2024-01-09 NOTE — Therapy (Signed)
 OUTPATIENT SPEECH LANGUAGE PATHOLOGY  TREATMENT NOTE 10th VISIT PROGRESS NOTE Re-Certification Request   Patient Name: Lindsey [name] MRN: [id number] DOB:03-04-1956, 68 y.o., female Today's Date: 01/09/2024  PCP: Karie Fetch, MD REFERRING PROVIDER: Sula Soda, MD    End of Session - 01/09/24 1111     Visit Number 10    Number of Visits 24    Date for SLP Re-Evaluation 02/06/24    Authorization Type Medicare Part A/ Medicare Part B    Progress Note Due on Visit 10    SLP Start Time [phone]    SLP Stop Time  1010    SLP Time Calculation (min) 45 min    Activity Tolerance Patient tolerated treatment well               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) 09/24/2021   Cerebral embolism with cerebral infarction 09/23/2021   Pyelonephritis 09/19/2021   Pain due to onychomycosis of toenails of both feet 11/19/2020   Normocytic anemia 05/31/2020   Acute ischemic left MCA stroke (HCC) 04/30/2020   Right hemiplegia (HCC) 04/30/2020   Cerebrovascular accident (HCC) 04/21/2020   Atrial fibrillation with RVR (HCC) 04/21/2020   Essential hypertension 04/21/2020   Alcohol abuse 04/21/2020    ONSET DATE: 04/30/2020  initial CVA; 10/25/2020  second CVA;  11/07/2023 date of referral  REFERRING DIAG: R47.01 (ICD-10-CM) - Aphasia   THERAPY DIAG:  Aphasia  Apraxia  Rationale for Evaluation and Treatment Rehabilitation  SUBJECTIVE:   PERTINENT HISTORY and DIAGNOSTIC FINDINGS:   Patient is a 68 y.o. female with the following prior history:  MRI 04/21/2020 Brain: Acute infarct left MCA territory. Infarct involves the left frontal lobe, insula as well as the caudate and putamen. There are areas of hemorrhage within the caudate and putamen as well as in the left frontal lobe.  Scattered small white matter hyperintensities elsewhere compatible with chronic microvascular ischemia. Ventricle size normal. Negative for mass lesion.  Inpatient Rehabilitation ST Services: 04/2020 thru 05/2020 Outpatient ST services 08/2020 thru 08/2021 Pt discharged with Lingraphica Device; per chart using device to assist with communication  MRI 09/22/2021 IMPRESSION: 1. Acute moderate size Left AICA cerebellar infarct. Petechial hemorrhage but no malignant hemorrhagic transformation. Cytotoxic edema but no posterior fossa mass effect at this time.  2. Underlying extensive chronic left MCA territory encephalomalacia related to the 2021 infarct. Associated left brainstem Wallerian degeneration since last year. And small superimposed chronic Left PICA cerebellar infarcts, but also new since last year.   Inpatient Rehabilitation ST Services at [facility]: 10/02/2021 thru 10/21/2021 Outpatient ST Services [facility] 10/2021 thru 03/08/2022 Pt discharged d/t reaching maximal rehabilitation potential; "3/4 LTGs particially met due to  limited caregiver availability"  Pt is currently residing at a SNF [facility]). Additional medical history includes alcoholism,  AFib, Renal Artery thrombosis, situational depression, HTN, Pyelnephritis, Seizure activity, Constipation, AKI. Vitamin D deficiency, and urinary incontinence.   PAIN:   Are you having pain? No  FALLS: Has patient fallen in last 6 months?  See PT evaluation for details  LIVING ENVIRONMENT: Lives with: lives with their family and lives in a skilled nursing facility   PLOF:  Level of assistance: Needed assistance with ADLs, Needed assistance with IADLS   PATIENT GOALS    pt unable to state - her daughter reports she would like for pt to be able to "communicate basics, return to abilities before second stroke (09/2021)"  SUBJECTIVE STATEMENT: Pt arrived eager after PT, gesturing that PT was tiring Pt accompanied by: daughter arrived after session started  OBJECTIVE:   TODAY'S TREATMENT:     Skilled treatment session focused on her communication goals. SLP facilitated the session by providing the following interventions:  TalkPath Therapy App utilized for language Sentence completion - level 2 - with SLP reading cues, pt got 16 out of 20 correctly Name the Category - level 1 - with SLP reading choices, pt got 15 out of 20 Find the one that belongs in the category - level 1 - with SLP reading choices, pt achieved 15 out of 20    PATIENT EDUCATION: Education details: use of low tech communication  Person educated: Patient and Child(ren) Education method: Explanation Education comprehension: needs further education  HOME EXERCISE PROGRAM:   TalkPath Therapy App Increase her involvement in activities provided by facility   GOALS:  Goals reviewed with patient? Yes  SHORT TERM GOALS: Target date: 10 sessions  Given verbal or visual prompt, pt will correctly ID written word/object/icon in field of 4-6 to communicate wants/needs with 75% accuracy given occasional mod A over 2 sessions  Baseline: Goal status: INITIAL: progress made, staff is reportedly using at nursing home   LONG TERM GOALS: Target date: 02/06/2024  Pt will utilize multimodal communication to express basic wants/needs as reported by pt and her daughter in 3 out of 5  opportunities.  Baseline:  Goal status: INITIAL: progress made   ASSESSMENT:  CLINICAL IMPRESSION: Patient is a 68 y.o. right handed female who was seen today for a speech language treatment d/t history of Left MCA CVA. 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 Her verbal language Is halting and while she does produce some islands of improved speech intelligibility during naming tasks and spontaneous responses, she speech contains neologisms with evidence of word finding difficulty. Pt's daughter reports that pt doesn't use her Lingraphica device "as much as she should" and it is only available to her during weekends at home for fear that it might be lost at pt's facility.   Pt with increased islands of clear speech x 1 today. She continues to report decreased social interaction. See the above treatment note for details. Will request re-certification to target improved use of functional language.   OBJECTIVE IMPAIRMENTS include attention, aphasia, apraxia, and dysarthria. These impairments are limiting patient from effectively communicating at home and in community. Factors affecting potential to achieve goals and functional outcome are ability to learn/carryover information, co-morbidities, cooperation/participation level, medical prognosis, previous level of function, severity of impairments, family/community support, and time post onset . Patient will benefit from skilled SLP services to  address above impairments and improve overall function.  REHAB POTENTIAL: Fair time post onset, multiple courses of ST services, decreased carryover of therapy concepts during previous sessions; pt discharged from previous course of ST services (2023) requiring moderate assistance for communication  PLAN: SLP FREQUENCY: 1-2x/week  SLP DURATION: 8 weeks  PLANNED INTERVENTIONS: Language facilitation,  Internal/external aids, Functional tasks, Multimodal communication approach, SLP instruction and feedback, Compensatory strategies, Patient/family education, and [address] Treatment of speech (30 or 45 min)     Darsh Vandevoort B. Dreama Saa, M.S., CCC-SLP, Tree surgeon Certified Brain Injury Specialist [facility]  [facility] Rehabilitation Services Office [phone] Ascom [phone] Fax [phone]

## 2024-01-09 NOTE — Therapy (Signed)
 OUTPATIENT PHYSICAL THERAPY NEURO TREATMENT  Patient Name: Lindsey [name] MRN: [id number] DOB:03-Nov-1955, 68 y.o., female Today's Date: 01/09/2024   PCP: Emogene Morgan, MD  REFERRING PROVIDER: Horton Chin, MD   END OF SESSION:   PT End of Session - 01/09/24 0816     Visit Number 13    Number of Visits 24    Date for PT Re-Evaluation 02/06/24    Progress Note Due on Visit 20    PT Start Time 0845    PT Stop Time 0930    PT Time Calculation (min) 45 min    Equipment Utilized During Treatment Gait belt    Activity Tolerance Patient tolerated treatment well    Behavior During Therapy WFL for tasks assessed/performed               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   REFERRING DIAG:  Diagnosis  I69.398,R25.2 (ICD-10-CM) - Spasticity as late effect of cerebrovascular accident (CVA)    THERAPY DIAG:  Muscle weakness (generalized)  Other lack of coordination  Abnormality of gait and mobility  Unsteadiness on feet  Other abnormalities of gait and mobility  Difficulty in walking, not elsewhere classified  Rationale for Evaluation and Treatment: Rehabilitation  SUBJECTIVE:  SUBJECTIVE STATEMENT:  Patient reports doing well overall and no new issues. Arrives without family to PT today. Did not watch basketball tournament.    From EVAL: Daughter present for Evaluation to provide hx. Pt is familiar to this clinic and has been seen for multiple bouts PT since initial CVA in 2021. Pt d/c'ed from PT serviced in April of 2024 with plans to receive PT services through [facility]. Daughter reports that she was only receiving minimal therapy in facility. Reports at least 1 fall in the last 6 months and daughter asked staff to transfer pt with lift since fall.  Daughter would want pt to demonstrate improve safety with transfers to allow bil transfers with reduced use of rail to allow pt to return home for assisted living.  Unable to discern whether pt in SNF or assisted living care from hx.    Pt accompanied by: self and family member   PERTINENT HISTORY:  1. Acute Ischemic Left MCA Stroke:Right Hemiplegia: Ordered outpatient OT, PT, and SLP. She has had a HFU appointment with Neurology. Discussed that I can provide new scripts next month if needed. Communicated with her providers regarding positive reinforcement. She has made excellent gains in strength as well as cognition and speech!   -Provided with new disability placard.    2. Essential  Hypertension: BP is currently very elevated and discussed with patient that it would be unsafe for her to receive injections today as the injections can temporarily increase her pain, which can increase her blood pressure, and put her at risk for stroke. Discussed starting the medication Valsartan 40mg  daily and logging blood pressures daily at home and she is agreeable. Medications reviewed and she is taking Amlodipine 5mg  and lopressor.  Provided with a list of foods that will help lower her BP.  BP reviewed and improved at 136/82 -continue clonidine -Advised checking BP daily at home and logging results to bring into follow-up appointment with PCP and myself. -Reviewed BP meds today.  -Advised regarding healthy foods that can help lower blood pressure and provided with a list:  PAIN:  Are you having pain? No  PRECAUTIONS: Fall  RED FLAGS: None   WEIGHT BEARING RESTRICTIONS: No  FALLS: Has patient fallen in last 6 months? Yes. Number of falls 1  LIVING ENVIRONMENT: Lives with: lives with their family and lives in a skilled nursing facility Lives in: Other SNF  Stairs: No Has following equipment at home: Hemi walker and Wheelchair (manual)  PLOF: Requires assistive device for independence, Needs assistance with ADLs, Needs assistance with homemaking, and WC level currently   PATIENT GOALS: move better - walking or transferring with hemi walking   OBJECTIVE:  Note: Objective measures were completed at Evaluation unless otherwise noted.  DIAGNOSTIC FINDINGS:  Head CT:  IMPRESSION: 1. No acute intracranial abnormality. 2. No acute displaced fracture or traumatic listhesis of the cervical spine.  Cervical CT:  MPRESSION: 1. No acute intracranial abnormality. 2. No acute displaced fracture or traumatic listhesis of the cervical spine.  COGNITION: Overall cognitive status: History of cognitive impairments - at baseline   SENSATION: Light touch: Impaired  Proprioception:  Impaired  Able to detect depe pressure   COORDINATION: Increased tone on the RLE   EDEMA:  Mild RLE distal edema   MUSCLE TONE: RLE: Mild and Moderate  MUSCLE LENGTH: Hamstrings: grossly limited    POSTURE: rounded shoulders, forward head, and posterior bias   LOWER EXTREMITY ROM:     Active  Right Eval  Left Eval  Hip flexion  Limited to 5 deg beyond 90 in sitting  Knee flexion  90deg in sitting  Knee extension  Lacking 10 deg full extension  Ankle dorsiflexion  none  Ankle plantarflexion  none   (Blank rows = not tested)  LOWER EXTREMITY MMT:    MMT Right Eval Left Eval  Hip flexion  2+  Hip extension    Hip abduction  2  Hip adduction  3  Hip internal rotation    Hip external rotation    Knee flexion  2+  Knee extension  3+  Ankle dorsiflexion  0  Ankle plantarflexion  0  Ankle inversion    Ankle eversion    (Blank rows = not tested)  BED MOBILITY:  Sit to supine Mod A Supine to sit Mod A Rolling to Right Min A Rolling to Left Min A  TRANSFERS: Assistive device utilized: Hemi walker  Sit to stand: Mod A Stand to sit: Mod A Chair to chair: Mod A Floor:  unable to perform     CURB:  Level of Assistance: Total A Assistive device utilized:  rail Curb Comments: unable to perform   STAIRS: Unable to perform at eval   GAIT: Gait pattern:  non-functional constant posterior bias , step to pattern, decreased stance time- Right, Right hip hike, lateral hip instability, and lateral lean- Left Distance walked: 28ft Assistive device utilized: Hemi walker Level of assistance: Mod A Comments: moderate cues for sequencing of HW and gait pattern in turns   FUNCTIONAL TESTS:  5 times sit to stand: 48 sec with mod Assist from PT  Timed up and go (TUG): unable to perform  10 meter walk test: 6 ft with min-mod assist and HW.  FIST:  Function In Sitting Test (FIST)  (1/2 femur on surface; hips/knees flexed to 90deg)   - indicate bed or mat table / step  stool if used  SCORING KEY: 4 = Independent (completes task independently & successfully) 3 = Verbal Cues/Increased Time (completes task independently & successfully and only needs more time/cues) 2 = Upper Extremity Support (must use UE for support or assistance to complete successfully) 1 = Needs Assistance (unable to complete w/o physical assist; DOCUMENT LEVEL: min, mod, max) 0 = Dependent (requires complete physical assist; unable to complete successfully even w/ physical assist)  Randomly Administer Once Throughout Exam  4 - Anterior Nudge (superior sternum)  4 - Posterior Nudge (between scapular spines)  4 - Lateral Nudge (to dominant side at acromion)     4 - Static sitting (30 seconds)  4 - Sitting, shake 'no' (left and right)  4 - Sitting, eyes closed (30 seconds)   3 - Sitting, lift foot (dominant side, lift foot 1 inch twice)    4 - Pick up object from behind (object at midline, hands breadth posterior)  1 mod assist - Forward reach (use dominant arm, must complete full motion) 1 mod assist - Lateral reach (use dominant arm, clear opposite ischial tuberosity) 1 mod assist  - Pick up object from floor (from between feet)   1 mod assist - Posterior scooting (move backwards 2 inches)  1 min assist  - Anterior scooting (move forward 2 inches)  1 min assist  - Lateral scooting (move to dominant side 2 inches)    TOTAL = 37/56  Notes/comments: apraxia limiting success of scooting and reacting tasks.  MCD > 5 points MCID for IP REHAB > 6 points  PATIENT SURVEYS:  To be completed                                                                                                                               TREATMENT DATE: 01/09/2024  Unless otherwise stated, min A was provided and gait belt donned in order to ensure pt safety throughout session.  Standing at rail:  Weight shift R and L with BUE support. X 15bil  Forward step x 8 bil  Lateral step/abduction x8 bil   Blocked practice sit<>stand with errorless learning to push from arm rest and reach for arm rest to sit in char. 2 x 10 min progressing to mod assist from PT due to increased tone in the RLE and apraxia with fatigue.   Side stepping at rail 15ft bil x 2 bouts with min-mod assist for RLE position and posture to improve erect posture.   Gait with HW x 30ft with min assist overall and mod assist x 2 to prevent posterior LOB in turn. Assist provided for proper weight shift laterally to increase WB on the RLE and prevent posterior bias with fatigue.   Standing without Ue support 3 x 10 sec mod assist on first bout progressing to min assist on last bout. Tactile cues for awareness of posterior LOB. Improved confidence with each trial and reduced reaching for [facility].     PATIENT EDUCATION: Education details: POC.  Pt educated throughout session about proper posture and technique with exercises. Improved exercise technique, movement at target joints, use of target muscles after min to mod verbal, visual, tactile cues.   Person educated: Patient and Child(ren) Education method: Explanation Education comprehension: verbalized understanding  HOME EXERCISE PROGRAM: Access Code: [email or web address] URL: [email or web address] Date: 12/05/2023 Prepared by: Grier Rocher  Exercises - Sit to Stand with Counter Support  - 1 x daily - 3-4 x weekly - 2 sets - 10 reps - Seated Long Arc Quad  - 1 x daily - 3-4 x weekly - 2 sets - 10 reps - 2 hold - Seated March  - 1 x daily - 3-4 x weekly - 2 sets - 10 reps - 2 hold - Standing Hip Abduction with Counter Support  - 1 x daily - 7 x weekly - 3 sets - 5 reps - Wide Stance with Counter Support  - 1 x daily - 7 x weekly - 3 sets - 2 reps - 30 hold - Seated Hip Abduction  - 1 x daily - 7 x weekly - 3 sets - 10 reps - Supine Bridge  - 1 x daily - 7 x weekly - 3 sets - 10 reps - Supine Short Arc Quad  - 1 x daily - 7 x weekly - 3 sets - 10 reps - Bent Knee Fallouts   - 1 x daily - 7 x weekly - 3 sets - 10 reps - Small Range Straight Leg Raise  - 1 x daily - 7  x weekly - 3 sets - 10 reps - Supine Heel Slide  - 1 x daily - 7 x weekly - 3 sets - 10 reps   GOALS: Goals reviewed with patient? Yes  SHORT TERM GOALS: Target date: 12/13/2023    Patient will be independent in home exercise program to improve strength/mobility for better functional independence with ADLs. Baseline: to be provided on visit 2: 12/28/2023- No caregiver with patient to determine if compliant with HEP Goal status: INITIAL   LONG TERM GOALS: Target date: 02/07/2024     2.  Patient (> 92 years old) will complete five times sit to stand test by 15 seconds and only min assist indicating an increased LE strength and improved balance. Baseline: 48 with mod assist from PT; 12/28/2023= 44 sec from EOM at lowest postion (approx 21 in) with Left UE support and CGA to min A from PT Goal status: Progressing 3.  Patient will increase FIST score by > 6 points to demonstrate decreased fall risk during functional activities Baseline: 37 Goal status: INITIAL  4.  Patient will complete 10 meter walk test to with HW in less than 1 minute with min assist as to improve gait speed for better community ambulation and to reduce fall risk. Baseline: 6 ft with HW and mod assist; 12/28/2023=18 feet with HW- mod assist  Goal status: PROGRESSING  5.  Patient will perform stand pivot transfer to and from [facility] with HW and CGA for safety to allow safe transfer to toilet with daughter without use of rail on the wall. Baseline: mod assist and heavy use of rail. 12/28/2023= Min to Mod assist with repetitive VC for technique Goal status: Progressing  6.  Patient will improve bed mobility to min assist only to reduce care giver burden and improve safety of transfers  Baseline: mod-max assist Goal status: INITIAL  ASSESSMENT:  CLINICAL IMPRESSION:  Patient presents with good motivation for assessment visit. PT  treatmeant focused on improved balance, posture, weight shift in functional movement patterns. Noted to have increased tone and apraxia with fatigue , but overall completed all interventions well with min-mod assist from PT. Able to stand without UE support for up to sec on this day and min assist at best with only mild posterior bias on last set.  Pt will benefit from continued skilled PT to address strength and balance deficits to improve safety for transfers, reduce care giver burden, and allow return to PLOF and possible return home from Assisted living.   OBJECTIVE IMPAIRMENTS: Abnormal gait, cardiopulmonary status limiting activity, decreased activity tolerance, decreased balance, decreased cognition, decreased coordination, decreased endurance, decreased knowledge of use of DME, decreased mobility, difficulty walking, decreased ROM, decreased strength, hypomobility, increased muscle spasms, impaired flexibility, impaired sensation, impaired tone, impaired UE functional use, improper body mechanics, and postural dysfunction.   ACTIVITY LIMITATIONS: carrying, lifting, bending, standing, squatting, stairs, transfers, bed mobility, continence, bathing, toileting, dressing, reach over head, hygiene/grooming, and locomotion level  PARTICIPATION LIMITATIONS: cleaning, interpersonal relationship, shopping, and community activity  PERSONAL FACTORS: 3+ comorbidities: HTN, chronic CVA, Afib, Lupus   are also affecting patient's functional outcome.   REHAB POTENTIAL: Fair chronic CVA with poor progress in prior interventions with this clinic  CLINICAL DECISION MAKING: Unstable/unpredictable  EVALUATION COMPLEXITY: High  PLAN:  PT FREQUENCY: 1-2x/week  PT DURATION: 12 weeks  PLANNED INTERVENTIONS: 97110-Therapeutic exercises, 97530- Therapeutic activity, [id number]- Neuromuscular re-education, 97535- Self Care, [id number]- Manual therapy, [id number]- Gait training, [phone]- Orthotic Fit/training, [id number]- Aquatic  Therapy, [phone]- Splinting,  96045- Electrical stimulation (unattended), [phone]- Electrical stimulation (manual), Patient/Family education, Balance training, Stair training, Joint mobilization, Vestibular training, Visual/preceptual remediation/compensation, Cognitive remediation, DME instructions, Wheelchair mobility training, Cryotherapy, and Moist heat  PLAN FOR NEXT SESSION:   - stretching of quads for tone management to improve knee flexion ROM to allow proper foot positioning underneath BOS during transfers - stretching hip flexors to improve upright posture in standing -  R glute activation and strength during standing  Continue  standing balance. Gait. Transfers.    Grier Rocher PT, DPT  Physical Therapist - Walker  [facility]  8:26 AM 01/09/24

## 2024-01-11 ENCOUNTER — Ambulatory Visit: Payer: Medicare Other | Admitting: Physical Therapy

## 2024-01-11 ENCOUNTER — Encounter: Payer: Self-pay | Admitting: Occupational Therapy

## 2024-01-11 ENCOUNTER — Ambulatory Visit: Payer: Medicare Other | Admitting: Occupational Therapy

## 2024-01-11 ENCOUNTER — Ambulatory Visit: Payer: Medicare Other | Admitting: Speech Pathology

## 2024-01-11 DIAGNOSIS — R262 Difficulty in walking, not elsewhere classified: Secondary | ICD-10-CM

## 2024-01-11 DIAGNOSIS — R4701 Aphasia: Secondary | ICD-10-CM

## 2024-01-11 DIAGNOSIS — R482 Apraxia: Secondary | ICD-10-CM

## 2024-01-11 DIAGNOSIS — R41841 Cognitive communication deficit: Secondary | ICD-10-CM

## 2024-01-11 DIAGNOSIS — R2681 Unsteadiness on feet: Secondary | ICD-10-CM

## 2024-01-11 DIAGNOSIS — R278 Other lack of coordination: Secondary | ICD-10-CM

## 2024-01-11 DIAGNOSIS — R269 Unspecified abnormalities of gait and mobility: Secondary | ICD-10-CM

## 2024-01-11 DIAGNOSIS — R2689 Other abnormalities of gait and mobility: Secondary | ICD-10-CM

## 2024-01-11 DIAGNOSIS — M6281 Muscle weakness (generalized): Secondary | ICD-10-CM

## 2024-01-11 NOTE — Therapy (Signed)
 OUTPATIENT PHYSICAL THERAPY NEURO TREATMENT  Patient Name: Lindsey [name] MRN: [id number] DOB:02-23-1956, 68 y.o., female Today's Date: 01/11/2024   PCP: Emogene Morgan, MD  REFERRING PROVIDER: Horton Chin, MD   END OF SESSION:   PT End of Session - 01/11/24 0845     Visit Number 14    Number of Visits 24    Date for PT Re-Evaluation 02/06/24    Progress Note Due on Visit 20    PT Start Time 0805    PT Stop Time 0844    PT Time Calculation (min) 39 min    Equipment Utilized During Treatment Gait belt    Activity Tolerance Patient tolerated treatment well    Behavior During Therapy WFL for tasks assessed/performed               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   REFERRING DIAG:  Diagnosis  I69.398,R25.2 (ICD-10-CM) - Spasticity as late effect of cerebrovascular accident (CVA)    THERAPY DIAG:  Apraxia  Muscle weakness (generalized)  Other lack of coordination  Unsteadiness on feet  Abnormality of gait and mobility  Other abnormalities of gait and mobility  Difficulty in walking, not elsewhere classified  Rationale for Evaluation and Treatment: Rehabilitation  SUBJECTIVE:  SUBJECTIVE STATEMENT:  Patient reports doing well overall and no new issues. Arrives without family to PT today. Did not watch basketball tournament.    From EVAL: Daughter present for Evaluation to provide hx. Pt is familiar to this clinic and has been seen for multiple bouts PT since initial CVA in 2021. Pt d/c'ed from PT serviced in April of 2024 with plans to receive PT services through [facility]. Daughter reports that she was only receiving minimal therapy in facility. Reports at least 1 fall in the last 6 months and daughter asked staff to transfer pt with lift since fall.  Daughter would want pt to demonstrate improve safety with transfers to allow bil transfers with reduced use of rail to allow pt to return home for assisted living.  Unable to discern whether pt in SNF or assisted living care from hx.    Pt accompanied by: self and family member   PERTINENT HISTORY:  1. Acute Ischemic Left MCA Stroke:Right Hemiplegia: Ordered outpatient OT, PT, and SLP. She has had a HFU appointment with Neurology. Discussed that I can provide new scripts next month if needed. Communicated with her providers regarding positive reinforcement. She has made excellent gains in strength as well as cognition and speech!   -Provided with new disability placard.    2. Essential  Hypertension: BP is currently very elevated and discussed with patient that it would be unsafe for her to receive injections today as the injections can temporarily increase her pain, which can increase her blood pressure, and put her at risk for stroke. Discussed starting the medication Valsartan 40mg  daily and logging blood pressures daily at home and she is agreeable. Medications reviewed and she is taking Amlodipine 5mg  and lopressor.  Provided with a list of foods that will help lower her BP.  BP reviewed and improved at 136/82 -continue clonidine -Advised checking BP daily at home and logging results to bring into follow-up appointment with PCP and myself. -Reviewed BP meds today.  -Advised regarding healthy foods that can help lower blood pressure and provided with a list:  PAIN:  Are you having pain? No  PRECAUTIONS: Fall  RED FLAGS: None   WEIGHT BEARING RESTRICTIONS: No  FALLS: Has patient fallen in last 6 months? Yes. Number of falls 1  LIVING ENVIRONMENT: Lives with: lives with their family and lives in a skilled nursing facility Lives in: Other SNF  Stairs: No Has following equipment at home: Hemi walker and Wheelchair (manual)  PLOF: Requires assistive device for independence, Needs assistance with ADLs, Needs assistance with homemaking, and WC level currently   PATIENT GOALS: move better - walking or transferring with hemi walking   OBJECTIVE:  Note: Objective measures were completed at Evaluation unless otherwise noted.  DIAGNOSTIC FINDINGS:  Head CT:  IMPRESSION: 1. No acute intracranial abnormality. 2. No acute displaced fracture or traumatic listhesis of the cervical spine.  Cervical CT:  MPRESSION: 1. No acute intracranial abnormality. 2. No acute displaced fracture or traumatic listhesis of the cervical spine.  COGNITION: Overall cognitive status: History of cognitive impairments - at baseline   SENSATION: Light touch: Impaired  Proprioception:  Impaired  Able to detect depe pressure   COORDINATION: Increased tone on the RLE   EDEMA:  Mild RLE distal edema   MUSCLE TONE: RLE: Mild and Moderate  MUSCLE LENGTH: Hamstrings: grossly limited    POSTURE: rounded shoulders, forward head, and posterior bias   LOWER EXTREMITY ROM:     Active  Right Eval  Left Eval  Hip flexion  Limited to 5 deg beyond 90 in sitting  Knee flexion  90deg in sitting  Knee extension  Lacking 10 deg full extension  Ankle dorsiflexion  none  Ankle plantarflexion  none   (Blank rows = not tested)  LOWER EXTREMITY MMT:    MMT Right Eval Left Eval  Hip flexion  2+  Hip extension    Hip abduction  2  Hip adduction  3  Hip internal rotation    Hip external rotation    Knee flexion  2+  Knee extension  3+  Ankle dorsiflexion  0  Ankle plantarflexion  0  Ankle inversion    Ankle eversion    (Blank rows = not tested)  BED MOBILITY:  Sit to supine Mod A Supine to sit Mod A Rolling to Right Min A Rolling to Left Min A  TRANSFERS: Assistive device utilized: Hemi walker  Sit to stand: Mod A Stand to sit: Mod A Chair to chair: Mod A Floor:  unable to perform     CURB:  Level of Assistance: Total A Assistive device utilized:  rail Curb Comments: unable to perform   STAIRS: Unable to perform at eval   GAIT: Gait pattern:  non-functional constant posterior bias , step to pattern, decreased stance time- Right, Right hip hike, lateral hip instability, and lateral lean- Left Distance walked: 89ft Assistive device utilized: Hemi walker Level of assistance: Mod A Comments: moderate cues for sequencing of HW and gait pattern in turns   FUNCTIONAL TESTS:  5 times sit to stand: 48 sec with mod Assist from PT  Timed up and go (TUG): unable to perform  10 meter walk test: 6 ft with min-mod assist and HW.  FIST:  Function In Sitting Test (FIST)  (1/2 femur on surface; hips/knees flexed to 90deg)   - indicate bed or mat table / step  stool if used  SCORING KEY: 4 = Independent (completes task independently & successfully) 3 = Verbal Cues/Increased Time (completes task independently & successfully and only needs more time/cues) 2 = Upper Extremity Support (must use UE for support or assistance to complete successfully) 1 = Needs Assistance (unable to complete w/o physical assist; DOCUMENT LEVEL: min, mod, max) 0 = Dependent (requires complete physical assist; unable to complete successfully even w/ physical assist)  Randomly Administer Once Throughout Exam  4 - Anterior Nudge (superior sternum)  4 - Posterior Nudge (between scapular spines)  4 - Lateral Nudge (to dominant side at acromion)     4 - Static sitting (30 seconds)  4 - Sitting, shake 'no' (left and right)  4 - Sitting, eyes closed (30 seconds)   3 - Sitting, lift foot (dominant side, lift foot 1 inch twice)    4 - Pick up object from behind (object at midline, hands breadth posterior)  1 mod assist - Forward reach (use dominant arm, must complete full motion) 1 mod assist - Lateral reach (use dominant arm, clear opposite ischial tuberosity) 1 mod assist  - Pick up object from floor (from between feet)   1 mod assist - Posterior scooting (move backwards 2 inches)  1 min assist  - Anterior scooting (move forward 2 inches)  1 min assist  - Lateral scooting (move to dominant side 2 inches)    TOTAL = 37/56  Notes/comments: apraxia limiting success of scooting and reacting tasks.  MCD > 5 points MCID for IP REHAB > 6 points  PATIENT SURVEYS:  To be completed                                                                                                                               TREATMENT DATE: 01/11/2024  Sit<>stand from [facility] with min-mod assist x 5, increased assist due to increased posterior bias with fatigue.  Stand pivot transfer to Mat table with mod assist and heavy posterior bias on this day.   PT instructed pt in improved  isolated activation of RLE in bed level NMR:  SAQ x 15 bil  Heel slide x 15 bil with AAROM on the RLE SLR x 10 bil with AAROM on RLE Supine Hip fall out and adduction x  AROM with visual cues for full ROM.  Sidelying single limb clam shell x 15 RLE, AAROM  Sidelying RLE hip abduction AAROM x 12  Rolling blocked practice to the R and into supine x 6 with max progressing to mod assist.   Lateral scoot to the R with RLE blocked x 5.  Rolling to sit EOB with max assist through log roll.  Stand pivot transfer to [facility] with mod assist with continued posterior bias.       PATIENT EDUCATION: Education details: POC.  Pt educated throughout session about proper posture and technique with exercises. Improved exercise technique, movement at target joints, use of target muscles after min to mod verbal, visual, tactile cues.   Person educated: Patient and Child(ren) Education method: Explanation Education comprehension: verbalized understanding  HOME EXERCISE PROGRAM: Access Code: [email or web address] URL: [email or web address] Date: 12/05/2023 Prepared by: Grier Rocher  Exercises - Sit to Stand with Counter Support  - 1 x daily - 3-4 x weekly - 2 sets - 10 reps - Seated Long Arc Quad  - 1 x daily - 3-4 x weekly - 2 sets - 10 reps - 2 hold - Seated March  - 1 x daily - 3-4 x weekly - 2 sets - 10 reps - 2 hold - Standing Hip Abduction with Counter Support  - 1 x daily - 7 x weekly - 3 sets - 5 reps - Wide Stance with Counter Support  - 1 x daily - 7 x weekly - 3 sets - 2 reps - 30 hold - Seated Hip Abduction  - 1 x daily - 7 x weekly - 3 sets - 10 reps - Supine Bridge  - 1 x daily - 7 x weekly - 3 sets - 10 reps - Supine Short Arc Quad  - 1 x daily - 7 x weekly - 3 sets - 10 reps - Bent Knee Fallouts  - 1 x daily - 7 x weekly - 3 sets - 10 reps - Small Range Straight Leg Raise  - 1 x daily - 7 x weekly - 3 sets - 10 reps - Supine Heel Slide  - 1 x daily - 7 x weekly - 3 sets - 10  reps  GOALS: Goals reviewed with patient? Yes  SHORT TERM GOALS: Target date: 12/13/2023    Patient will be independent in home exercise program to improve strength/mobility for better functional independence with ADLs. Baseline: to be provided on visit 2: 12/28/2023- No caregiver with patient to determine if compliant with HEP Goal status: INITIAL   LONG TERM GOALS: Target date: 02/07/2024     2.  Patient (> 78 years old) will complete five times sit to stand test by 15 seconds and only min assist indicating an increased LE strength and improved balance. Baseline: 48 with mod assist from PT; 12/28/2023= 44 sec from EOM at lowest postion (approx 21 in) with Left UE support and CGA to min A from PT Goal status: Progressing 3.  Patient will increase FIST score by > 6 points to demonstrate decreased fall risk during functional activities Baseline: 37 Goal status: INITIAL  4.  Patient will complete 10 meter walk test to with HW in less than 1 minute with min assist as to improve gait speed for better community ambulation and to reduce fall risk. Baseline: 6 ft with HW and mod assist; 12/28/2023=18 feet with HW- mod assist  Goal status: PROGRESSING  5.  Patient will perform stand pivot transfer to and from [facility] with HW and CGA for safety to allow safe transfer to toilet with daughter without use of rail on the wall. Baseline: mod assist and heavy use of rail. 12/28/2023= Min to Mod assist with repetitive VC for technique Goal status: Progressing  6.  Patient will improve bed mobility to min assist only to reduce care giver burden and improve safety of transfers  Baseline: mod-max assist Goal status: INITIAL  ASSESSMENT:  CLINICAL IMPRESSION:  Patient presents with good motivation for assessment visit. PT treatmeant focused on improved isolated and full ROM on the RLE for supine interventions. Noted difficulty with rolling from R sidelying into supine as well increased posterior bias in  standing compared to prior session. Pt will benefit from continued skilled PT to address strength and balance deficits to improve safety for transfers, reduce care giver burden, and allow return to PLOF and possible return home from Assisted living.   OBJECTIVE IMPAIRMENTS: Abnormal gait, cardiopulmonary status limiting activity, decreased activity tolerance, decreased balance, decreased cognition, decreased coordination, decreased endurance, decreased knowledge of use of DME, decreased mobility, difficulty walking, decreased ROM, decreased strength, hypomobility, increased muscle spasms, impaired flexibility, impaired sensation, impaired tone, impaired UE functional use, improper body mechanics, and postural dysfunction.   ACTIVITY LIMITATIONS: carrying, lifting, bending, standing, squatting, stairs, transfers, bed mobility, continence, bathing, toileting, dressing, reach over head, hygiene/grooming, and locomotion level  PARTICIPATION LIMITATIONS: cleaning, interpersonal relationship, shopping, and community activity  PERSONAL FACTORS: 3+ comorbidities: HTN, chronic CVA, Afib, Lupus   are also affecting patient's functional outcome.   REHAB POTENTIAL: Fair chronic CVA with poor progress in prior interventions with this clinic  CLINICAL DECISION MAKING: Unstable/unpredictable  EVALUATION COMPLEXITY: High  PLAN:  PT FREQUENCY: 1-2x/week  PT DURATION: 12 weeks  PLANNED INTERVENTIONS: 97110-Therapeutic exercises, 97530- Therapeutic activity, [id number]- Neuromuscular re-education, 97535- Self Care, 16109- Manual therapy, [phone]- Gait training, [phone]- Orthotic Fit/training, [phone]- Aquatic Therapy, [phone]- Splinting, 97014- Electrical stimulation (unattended), [phone]- Electrical stimulation (manual), Patient/Family education, Balance training, Stair training, Joint mobilization, Vestibular training, Visual/preceptual remediation/compensation, Cognitive remediation, DME instructions, Wheelchair mobility  training, Cryotherapy, and Moist heat  PLAN FOR NEXT SESSION:   - stretching of quads for tone management to improve knee flexion ROM to allow proper foot positioning  underneath BOS during transfers - stretching hip flexors to improve upright posture in standing -  R glute activation and strength during standing  Continue  standing balance. Gait. Transfers.    Grier Rocher PT, DPT  Physical Therapist - [place]  [facility]  8:49 AM 01/11/24

## 2024-01-11 NOTE — Therapy (Addendum)
 OUTPATIENT SPEECH LANGUAGE PATHOLOGY  TREATMENT NOTE   Patient Name: Lindsey [name] MRN: [id number] DOB:[birth year], 68 y.o., female Today's Date: 01/11/2024  PCP: Karie Fetch, MD REFERRING PROVIDER: Sula Soda, MD    End of Session - 01/11/24 1251     Visit Number 11    Number of Visits 24    Date for SLP Re-Evaluation 02/06/24    Authorization Type Medicare Part A/ Medicare Part B    Progress Note Due on Visit 20    SLP Start Time 0845    SLP Stop Time  0930    SLP Time Calculation (min) 45 min    Activity Tolerance Patient tolerated treatment well               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) 09/24/2021   Cerebral embolism with cerebral infarction 09/23/2021   Pyelonephritis 09/19/2021   Pain due to onychomycosis of toenails of both feet 11/19/2020   Normocytic anemia 05/31/2020   Acute ischemic left MCA stroke (HCC) 04/30/2020   Right hemiplegia (HCC) 04/30/2020   Cerebrovascular accident (HCC) 04/21/2020   Atrial fibrillation with RVR (HCC) 04/21/2020   Essential hypertension 04/21/2020   Alcohol abuse 04/21/2020    ONSET DATE: 04/30/2020 initial CVA; 10/25/2020  second CVA;  11/07/2023 date of  referral  REFERRING DIAG: R47.01 (ICD-10-CM) - Aphasia   THERAPY DIAG:  Aphasia  Apraxia  Cognitive communication deficit  Rationale for Evaluation and Treatment Rehabilitation  SUBJECTIVE:   PERTINENT HISTORY and DIAGNOSTIC FINDINGS:   Patient is a 68 y.o. female with the following prior history:  MRI 04/21/2020 Brain: Acute infarct left MCA territory. Infarct involves the left frontal lobe, insula as well as the caudate and putamen. There are areas of hemorrhage within the caudate and putamen as well as in the left frontal lobe.  Scattered small white matter hyperintensities elsewhere compatible with chronic microvascular ischemia. Ventricle size normal. Negative for mass lesion.  Inpatient Rehabilitation ST Services: 04/2020 thru 05/2020 Outpatient ST services 08/2020 thru 08/2021 Pt discharged with Lingraphica Device; per chart using device to assist with communication  MRI 09/22/2021 IMPRESSION: 1. Acute moderate size Left AICA cerebellar infarct. Petechial hemorrhage but no malignant hemorrhagic transformation. Cytotoxic edema but no posterior fossa mass effect at this time.  2. Underlying extensive chronic left MCA territory encephalomalacia related to the 2021 infarct. Associated left brainstem Wallerian degeneration since last year. And small superimposed chronic Left PICA cerebellar infarcts, but also new since last year.   Inpatient Rehabilitation ST Services at [facility]: 10/02/2021 thru 10/21/2021 Outpatient ST Services [facility] 10/2021 thru 03/08/2022 Pt discharged d/t reaching maximal rehabilitation potential; "3/4 LTGs particially met due to limited caregiver  availability"  Pt is currently residing at a SNF [facility]). Additional medical history includes alcoholism,  AFib, Renal Artery thrombosis, situational depression, HTN, Pyelnephritis, Seizure activity, Constipation, AKI. Vitamin D deficiency, and urinary incontinence.   PAIN:  Are you having  pain? No  FALLS: Has patient fallen in last 6 months?  See PT evaluation for details  LIVING ENVIRONMENT: Lives with: lives with their family and lives in a skilled nursing facility   PLOF:  Level of assistance: Needed assistance with ADLs, Needed assistance with IADLS   PATIENT GOALS    pt unable to state - her daughter reports she would like for pt to be able to "communicate basics, return to abilities before second stroke (09/2021)"  SUBJECTIVE STATEMENT: Pt arrived eager after PT, gesturing that PT was tiring Pt accompanied by: daughter arrived after session started  OBJECTIVE:   TODAY'S TREATMENT:     Skilled treatment session focused on her communication goals. SLP facilitated the session by providing the following interventions:  Constant Therapy Clinician App was utilized to target pt's goals Understanding words you hear - Level 1 75% with Min to Mod A - related to some impulsivity, pt also benefited from verbal semantic cues to increase to 85%   PATIENT EDUCATION: Education details: use of low tech communication  Person educated: Patient and Child(ren) Education method: Explanation Education comprehension: needs further education  HOME EXERCISE PROGRAM:   TalkPath Therapy App Increase her involvement in activities provided by facility   GOALS:  Goals reviewed with patient? Yes  SHORT TERM GOALS: Target date: 10 sessions  Given verbal or visual prompt, pt will correctly ID written word/object/icon in field of 4-6 to communicate wants/needs with 75% accuracy given occasional mod A over 2 sessions  Baseline: Goal status: INITIAL: progress made, staff is reportedly using at nursing home   LONG TERM GOALS: Target date: 02/06/2024  Pt will utilize multimodal communication to express basic wants/needs as reported by pt and her daughter in 3 out of 5 opportunities.  Baseline:  Goal status: INITIAL: progress made   ASSESSMENT:  CLINICAL IMPRESSION: Patient  is a 68 y.o. right handed female who was seen today for a speech language treatment d/t history of Left MCA CVA. 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 Her verbal language Is halting and while she does produce some islands of improved speech intelligibility during naming tasks and spontaneous responses, she speech contains neologisms with evidence of word finding difficulty. Pt's daughter reports that pt doesn't use her Lingraphica device "as much as she should" and it is only available to her during weekends at home for fear that it might be lost at pt's facility.   Pt with increased islands of clear speech x 3 today. See the above treatment note for details.   OBJECTIVE IMPAIRMENTS include attention, aphasia, apraxia, and dysarthria. These impairments are limiting patient from effectively communicating at home and in community. Factors affecting potential to achieve goals and functional outcome are ability to learn/carryover information, co-morbidities, cooperation/participation level, medical prognosis, previous level of function, severity of impairments, family/community support, and time post onset . Patient will benefit from skilled SLP services to address above impairments and improve overall function.  REHAB POTENTIAL: Fair time post onset, multiple courses of ST services, decreased carryover of therapy concepts during previous sessions; pt discharged from previous course of ST services (2023) requiring moderate assistance for communication  PLAN:  SLP FREQUENCY: 1-2x/week  SLP DURATION: 8 weeks  PLANNED INTERVENTIONS: Language facilitation, Internal/external aids, Functional tasks, Multimodal communication approach, SLP instruction and feedback, Compensatory strategies, Patient/family education, and [id number] Treatment of speech (30 or 45 min)     Andy Allende B. Dreama Saa, M.S., CCC-SLP,  Tree surgeon Certified Brain Injury Specialist [facility]  [facility] Rehabilitation Services Office [phone] Ascom [phone] Fax [phone]

## 2024-01-11 NOTE — Therapy (Signed)
 OUTPATIENT OCCUPATIONAL THERAPY NEURO TREATMENT NOTE   Patient Name: Lindsey [name] MRN: [id number] DOB:1956/05/26, 68 y.o., female Today's Date: 01/11/2024  PCP: Emogene Morgan, MD REFERRING PROVIDER: Sula Soda, MD    OT End of Session - 01/11/24 1308     Visit Number 13    Number of Visits 24    Date for OT Re-Evaluation 02/06/24    Progress Note Due on Visit 10    OT Start Time 0930    OT Stop Time 1010    OT Time Calculation (min) 40 min    Activity Tolerance Patient tolerated treatment well    Behavior During Therapy WFL for tasks assessed/performed             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 No pertinent surgical history. 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) 09/24/2021   Cerebral embolism with cerebral infarction 09/23/2021   Pyelonephritis 09/19/2021   Pain due to onychomycosis of toenails of both feet 11/19/2020   Normocytic anemia 05/31/2020   Acute ischemic left MCA stroke (HCC) 04/30/2020   Right hemiplegia (HCC) 04/30/2020   Cerebrovascular accident (HCC) 04/21/2020   Atrial fibrillation with RVR (HCC) 04/21/2020   Essential hypertension 04/21/2020   Alcohol abuse 04/21/2020   ONSET DATE: 09/19/21  REFERRING DIAG: CVA  THERAPY DIAG:   Muscle weakness (generalized)  Other lack of coordination  Rationale for Evaluation and Treatment: Rehabilitation  SUBJECTIVE:  SUBJECTIVE STATEMENT: Pt reported disappointed in the cold weather. Pt Pt accompanied by: self, daughter  PERTINENT HISTORY: Pt. is a 68 y.o. 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 Pt. has Right sided weakness, and receptive, and expressive aphasia. Pt. has had a previous CVA with right sided weakness in July 2021. PMHx includes: AFib, Renal Artery thrombosis, situational depression, HTN, Pyelnephritis, Seizure activity, COnstipation, AKI. Vitamin D deficiency, and urinary incontinence   PRECAUTIONS: None  WEIGHT BEARING RESTRICTIONS: No  PAIN:  Are you having pain? 2/10 FACES pain in the right shoulder   FALLS: Has patient fallen in last 6 months? Yes. Number of falls 1-ER visit  LIVING ENVIRONMENT: Pt. resides at [facility]. Pt. has very supportive family Stairs: One level Has following equipment at home:  Equipment provided through [employer]  PLOF: Needs assistance with ADLs  PATIENT GOALS:  To improve ROM/flexibility in the RUE 2/2 increased tightness, and to improve ADL functioning in order to towards returning home.  OBJECTIVE:  Note: Objective measures were completed at Evaluation unless otherwise noted.  HAND DOMINANCE: Right  ADLs: Overall ADLs: Assist is provided from the staff at [facility]. Transfers/ambulation related to ADLs: Eating: independent, with set-up using the left hand Grooming: Independent  with set-up using the left hand UB Dressing: Increased assist required, Mod-MaxA doffing jacket LB Dressing: Increased assist required Toileting:  Staff Assists with toileting transfers, and toilet hygiene care. Bathing: Staff assists with bathing Tub Shower transfers: Requires staff assist Equipment: TBD  IADLs: Shopping: N/A Light  housekeeping: Provided by staff Meal Prep: 3 Meals a day provided. Pt. Eats in the main dining room when family visits, otherwise Pt. Eats in her room.  Community mobility: Relies on family for transportation to therapy. Per daughter- Transportation is provided for services not offered at the facility. Medication management: Provided by the facility Financial management: Daughter manages Handwriting: TBD  MOBILITY STATUS: Hx of falls  FUNCTIONAL OUTCOME MEASURES:  UPPER EXTREMITY ROM:    Passive ROM Left Eval AROM WFL Right Eval  Right 01/02/24  Shoulder flexion  0(46) 25 (90)  Shoulder abduction  0(64) 0 (90)  Shoulder adduction     Shoulder extension     Shoulder internal rotation     Shoulder external rotation   (25)  Elbow flexion  0(98) 0(131)  Elbow extension  0(full passive) 0 (full passive)  Wrist flexion  0(56) 0(90)  Wrist extension  -56(30) 0 (40)  Wrist ulnar deviation     Wrist radial deviation     Wrist pronation     Wrist supination     (Blank rows = not tested)  UPPER EXTREMITY MMT:     MMT Left Eval 5/5 Right eval Right  01/02/24  Shoulder flexion  0/5 2/5   Shoulder abduction  0/5 1/5  Shoulder adduction   2-/5  Shoulder extension     Shoulder internal rotation   1/5  Shoulder external rotation     Middle trapezius     Lower trapezius     Elbow flexion  0/5 0/5  Elbow extension  0/5 2/5  Wrist flexion   1/5  Wrist extension  0/5 3-/5  Wrist ulnar deviation     Wrist radial deviation     Wrist pronation  0/5 0/5  Wrist supination  0/5 0/5  Digit flexors   2-/5  Digit extensors   0/5  (Blank rows = not tested)  HAND FUNCTION: N/A  COORDINATION: N/A  SENSATION: Diminished  EDEMA: No edema noted in the right hand today  MUSCLE TONE: impaired  COGNITION: Overall cognitive status: Aphasia  VISION: Wears glasses, no change from baseline  PERCEPTION: TBD  PRAXIS: Impaired: Motor planning                                                                                                                            TREATMENT DATE: 01/11/24    Manual therapy:  -Soft tissue massage to the scapular, and right shoulder musculature. -Soft tissue mobilizations to promote radius on ulna motion needed for supination.  -Manual therapy was performed independent of, and in preparation for therapeutic Ex.  Therapeutic Exercise:  -PROM through the following joint ranges of the RUE,  including shoulder flexion, abduction, ER, horizontal abduction, elbow flexion, extension, forearm supination, wrist extension, digit extension, and thumb abduction, working to increase RUE mobility to manage hemiparetic limb during basic ADLs, as well as reducing contracture risk. Barbaraann Boys for R shoulder flex/ext, horiz abd/add to work towards repositioning arm on/off lap and arm rest of wc. -Good return demonstration of self-ROM HEP for digit extension  Self Care:  - MIN A wash hands, assist to place R hand in sink at chest level. MIN A drying hands, assist for thoroughness between digits. -MOD A don jacket in sitting, assist for threading RUE and pulling across back.     PATIENT EDUCATION: Education details: RUE PROM/AAROM Person educated: Patient Education method: Explanation, Demonstration, Tactile cues, and Verbal cues Education comprehension: verbal cues required, tactile cues required, and needs further education  HOME EXERCISE PROGRAM: RUE self PROM  GOALS: Goals reviewed with patient? Yes  SHORT TERM GOALS: Target date: 01/16/2024    Pt. Will demonstrate supervision with HEPs for BUEs Baseline: Eval: No current HEPs; 01/02/24: Pt demos self PROM techniques throughout the RUE with intermittent min A and mod vc for accuracy Goal status: in progress  LONG TERM GOALS: Target date: 02/07/2024    1.  Pt. Will improve right shoulder flexion to 45* to assist with UE dressing Baseline: Eval: Right: 0(46); 01/02/24: Right: 25 (90) Goal status: revised  on 01/02/24 d/t 20* increase met/ in progress  2.  Pt. Will improve with shoulder abduction by 20 degrees to assist with underarm care, and hygiene Baseline: Eval: Right 0(64); 01/02/24: Right 0 (90) Goal status: achieved  3.  Pt. Will improve right elbow flexion by 10 degrees to assist with hand to face patterns for grooming Baseline: Eval: Right: 0(98); 01/02/24: Right: 0(131) Goal status: in progress  4.  Pt. Will elicit active wrist extension, and digit extension in preparation for rubbing lotion into the left forearm, and hand  Baseline: Eval: Right wrist: -56 (30); 01/02/24: R wrist: ext: actively to neutral/0 (passively to 40*); R digit extension: no active ext (passively [facility] for this task); unable to self apply lotion to L forearm Goal status: in progress  5.  Pt. Will perform UE dressing skills with MinA Baseline: Eval: Mod-MaxA doffing a jacket; 01/02/24: Mod A to don/doff a jacket Goal status: in progress  6. Pt. Will require minA toileting transfers/ clothing management Baseline: Eval: Pt. Requires increased assist from staff; 01/02/24: pt can transfer to standard height toilet with grab bar for support with min A, but requires mod A to transfer up from toilet; Pt consistently performs wc<>BSC transfer with min A, extra time, and vc for sequencing while using hemi walker; total A for clothing management.  Goal status: in progress  ASSESSMENT:  CLINICAL IMPRESSION: Continues to require assist for hand washing and UB dressing, assist for thoroughness between digits. Good recall of self-ROM for digit exercises. Pt continues to present with increased flexor tone, and tightness proximally through the scapular, and shoulder musculature, as well as through the wrist, and digits, and presents with strong extensor tone in the right elbow.  Pt. continues to benefit from OT services to normalize tone in the dominant RUE, improve ROM for underarm hygiene care and UE dressing, as well as to improve  toileting care, and clothing negotiation tasks.     PERFORMANCE DEFICITS: in functional skills including ADLs, IADLs, coordination, dexterity, proprioception, sensation, ROM, strength, pain, Fine motor control, Gross motor control, balance, body mechanics, decreased knowledge of use of  DME, and UE functional use, cognitive skills including problem solving, and psychosocial skills including coping strategies, environmental adaptation, and routines and behaviors.   IMPAIRMENTS: are limiting patient from ADLs, IADLs, and leisure.   CO-MORBIDITIES: may have co-morbidities  that affects occupational performance. Patient will benefit from skilled OT to address above impairments and improve overall function.  MODIFICATION OR ASSISTANCE TO COMPLETE EVALUATION: Min-Moderate modification of tasks or assist with assess necessary to complete an evaluation.  OT OCCUPATIONAL PROFILE AND HISTORY: Detailed assessment: Review of records and additional review of physical, cognitive, psychosocial history related to current functional performance.  CLINICAL DECISION MAKING: Moderate - several treatment options, min-mod task modification necessary  REHAB POTENTIAL: Good  EVALUATION COMPLEXITY: Moderate    PLAN:  OT FREQUENCY: 2x/week  OT DURATION: 12 weeks  PLANNED INTERVENTIONS: 97168 OT Re-evaluation, 97535 self care/ADL training, 16109 therapeutic exercise, 97530 therapeutic activity, 97112 neuromuscular re-education, 97140 manual therapy, 97018 paraffin, 60454 moist heat, 97010 cryotherapy, 97034 contrast bath, passive range of motion, coping strategies training, patient/family education, and DME and/or AE instructions  RECOMMENDED OTHER SERVICES: PT, ST  CONSULTED AND AGREED WITH PLAN OF CARE: Patient  PLAN FOR NEXT SESSION: see above  Kathie Dike, M.S. OTR/L  01/11/24, 10:26 AM  ascom [phone]    01/11/2024, 10:26 AM

## 2024-01-16 ENCOUNTER — Ambulatory Visit: Payer: Medicare Other

## 2024-01-16 ENCOUNTER — Ambulatory Visit: Payer: Medicare Other | Admitting: Speech Pathology

## 2024-01-16 ENCOUNTER — Ambulatory Visit: Payer: Medicare Other | Admitting: Physical Therapy

## 2024-01-18 ENCOUNTER — Ambulatory Visit: Payer: Medicare Other | Admitting: Occupational Therapy

## 2024-01-18 ENCOUNTER — Ambulatory Visit: Payer: Medicare Other | Admitting: Speech Pathology

## 2024-01-18 ENCOUNTER — Ambulatory Visit: Payer: Medicare Other | Admitting: Physical Therapy

## 2024-01-23 ENCOUNTER — Ambulatory Visit: Payer: Medicare Other | Admitting: Physical Therapy

## 2024-01-23 ENCOUNTER — Ambulatory Visit: Payer: Medicare Other | Admitting: Occupational Therapy

## 2024-01-23 ENCOUNTER — Ambulatory Visit: Payer: Medicare Other | Admitting: Speech Pathology

## 2024-01-23 DIAGNOSIS — R269 Unspecified abnormalities of gait and mobility: Secondary | ICD-10-CM

## 2024-01-23 DIAGNOSIS — M6281 Muscle weakness (generalized): Secondary | ICD-10-CM

## 2024-01-23 DIAGNOSIS — R2681 Unsteadiness on feet: Secondary | ICD-10-CM

## 2024-01-23 DIAGNOSIS — R4701 Aphasia: Secondary | ICD-10-CM

## 2024-01-23 DIAGNOSIS — R482 Apraxia: Secondary | ICD-10-CM

## 2024-01-23 DIAGNOSIS — R2689 Other abnormalities of gait and mobility: Secondary | ICD-10-CM

## 2024-01-23 DIAGNOSIS — R41841 Cognitive communication deficit: Secondary | ICD-10-CM

## 2024-01-23 DIAGNOSIS — R262 Difficulty in walking, not elsewhere classified: Secondary | ICD-10-CM

## 2024-01-23 DIAGNOSIS — R278 Other lack of coordination: Secondary | ICD-10-CM

## 2024-01-23 NOTE — Therapy (Signed)
 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) 09/24/2021   Cerebral embolism with cerebral infarction 09/23/2021   Pyelonephritis 09/19/2021   Pain due to onychomycosis of toenails of both feet 11/19/2020   Normocytic anemia 05/31/2020   Acute ischemic left MCA stroke (HCC) 04/30/2020   Right hemiplegia (HCC) 04/30/2020   Cerebrovascular accident (HCC) 04/21/2020   Atrial fibrillation with RVR (HCC) 04/21/2020   Essential hypertension 04/21/2020   Alcohol abuse 04/21/2020   ONSET DATE: 09/19/21  REFERRING DIAG: CVA  THERAPY DIAG:  Muscle weakness (generalized)  Rationale for  Evaluation and Treatment: Rehabilitation  SUBJECTIVE:  SUBJECTIVE STATEMENT: Pt. reports having had a nice Easter. Pt Pt accompanied by: self, daughter  PERTINENT HISTORY: Pt. is a 68 y.o. 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 Pt. has Right sided weakness, and receptive, and expressive aphasia. Pt. has had a previous CVA with right sided weakness in July 2021. PMHx includes: AFib, Renal Artery thrombosis, situational depression, HTN, Pyelnephritis, Seizure activity, COnstipation, AKI. Vitamin D  deficiency, and urinary incontinence   PRECAUTIONS: None  WEIGHT BEARING RESTRICTIONS: No  PAIN:  Are you having pain? 2/10 FACES pain in the right shoulder   FALLS: Has patient fallen in last 6 months? Yes. Number of falls 1-ER visit  LIVING ENVIRONMENT: Pt. resides at [facility] Pt. has very supportive family Stairs: One level Has following equipment at home:  Equipment provided through [employer]  PLOF: Needs assistance with ADLs  PATIENT GOALS:  To improve ROM/flexibility in the RUE 2/2 increased tightness, and to improve ADL functioning in order to towards returning home.  OBJECTIVE:  Note: Objective measures were completed at Evaluation unless otherwise noted.  HAND DOMINANCE: Right  ADLs: Overall ADLs: Assist is provided from the staff at [facility]. Transfers/ambulation related to ADLs: Eating: independent, with set-up using the left hand Grooming: Independent with set-up using the left hand UB Dressing: Increased assist required, Mod-MaxA doffing jacket  LB Dressing: Increased assist required Toileting:  Staff Assists with toileting transfers, and toilet hygiene care. Bathing: Staff assists with bathing Tub Shower transfers: Requires staff assist Equipment: TBD  IADLs: Shopping: N/A Light housekeeping: Provided by staff Meal Prep: 3 Meals a day provided. Pt. Eats in the main dining  room when family visits, otherwise Pt. Eats in her room.  Community mobility: Relies on family for transportation to therapy. Per daughter- Transportation is provided for services not offered at the facility. Medication management: Provided by the facility Financial management: Daughter manages Handwriting: TBD  MOBILITY STATUS: Hx of falls  FUNCTIONAL OUTCOME MEASURES:  UPPER EXTREMITY ROM:    Passive ROM Left Eval AROM WFL Right Eval  Right 01/02/24  Shoulder flexion  0(46) 25 (90)  Shoulder abduction  0(64) 0 (90)  Shoulder adduction     Shoulder extension     Shoulder internal rotation     Shoulder external rotation   (25)  Elbow flexion  0(98) 0(131)  Elbow extension  0(full passive) 0 (full passive)  Wrist flexion  0(56) 0(90)  Wrist extension  -56(30) 0 (40)  Wrist ulnar deviation     Wrist radial deviation     Wrist pronation     Wrist supination     (Blank rows = not tested)  UPPER EXTREMITY MMT:     MMT Left Eval 5/5 Right eval Right  01/02/24  Shoulder flexion  0/5 2/5   Shoulder abduction  0/5 1/5  Shoulder adduction   2-/5  Shoulder extension     Shoulder internal rotation   1/5  Shoulder external rotation     Middle trapezius     Lower trapezius     Elbow flexion  0/5 0/5  Elbow extension  0/5 2/5  Wrist flexion   1/5  Wrist extension  0/5 3-/5  Wrist ulnar deviation     Wrist radial deviation     Wrist pronation  0/5 0/5  Wrist supination  0/5 0/5  Digit flexors   2-/5  Digit extensors   0/5  (Blank rows = not tested)  HAND FUNCTION: N/A  COORDINATION: N/A  SENSATION: Diminished  EDEMA: No edema noted in the right hand today  MUSCLE TONE: impaired  COGNITION: Overall cognitive status: Aphasia  VISION: Wears glasses, no change from baseline  PERCEPTION: TBD  PRAXIS: Impaired: Motor planning                                                                                                                           TREATMENT  DATE: 01/23/24    Manual therapy:   -Soft tissue massage to the scapular, pectoral, and right shoulder musculature. -Soft tissue mobilizations to promote radius on ulna motion needed for supination.  -Manual therapy was performed independent of, and in preparation for therapeutic Ex.  Therapeutic Exercise:   -PROM through the following joint ranges of the RUE, including shoulder flexion, abduction, ER, horizontal abduction, elbow flexion, extension, forearm  supination, wrist extension, digit extension, and thumb abduction, working to increase RUE mobility to manage hemiparetic limb during basic ADLs, as well as reducing contracture risk. Janine Melbourne for R shoulder flex/ext, horiz abd/add to work towards repositioning arm on/off lap and arm rest of wc. -Good return demonstration of self-ROM HEP for digit extension   PATIENT EDUCATION: Education details: RUE PROM/AAROM Person educated: Patient Education method: Explanation, Demonstration, Tactile cues, and Verbal cues Education comprehension: verbal cues required, tactile cues required, and needs further education  HOME EXERCISE PROGRAM: RUE self PROM  GOALS: Goals reviewed with patient? Yes  SHORT TERM GOALS: Target date: 01/16/2024    Pt. Will demonstrate supervision with HEPs for BUEs Baseline: Eval: No current HEPs; 01/02/24: Pt demos self PROM techniques throughout the RUE with intermittent min A and mod vc for accuracy Goal status: in progress  LONG TERM GOALS: Target date: 02/07/2024    1.  Pt. Will improve right shoulder flexion to 45* to assist with UE dressing Baseline: Eval: Right: 0(46); 01/02/24: Right: 25 (90) Goal status: revised on 01/02/24 d/t 20* increase met/ in progress  2.  Pt. Will improve with shoulder abduction by 20 degrees to assist with underarm care, and hygiene Baseline: Eval: Right 0(64); 01/02/24: Right 0 (90) Goal status: achieved  3.  Pt. Will improve right elbow flexion by 10 degrees to assist with hand to  face patterns for grooming Baseline: Eval: Right: 0(98); 01/02/24: Right: 0(131) Goal status: in progress  4.  Pt. Will elicit active wrist extension, and digit extension in preparation for rubbing lotion into the left forearm, and hand  Baseline: Eval: Right wrist: -56 (30); 01/02/24: R wrist: ext: actively to neutral/0 (passively to 40*); R digit extension: no active ext (passively [facility] for this task); unable to self apply lotion to L forearm Goal status: in progress  5.  Pt. Will perform UE dressing skills with MinA Baseline: Eval: Mod-MaxA doffing a jacket; 01/02/24: Mod A to don/doff a jacket Goal status: in progress  6. Pt. Will require minA toileting transfers/ clothing management Baseline: Eval: Pt. Requires increased assist from staff; 01/02/24: pt can transfer to standard height toilet with grab bar for support with min A, but requires mod A to transfer up from toilet; Pt consistently performs wc<>BSC transfer with min A, extra time, and vc for sequencing while using hemi walker; total A for clothing management.  Goal status: in progress  ASSESSMENT:  CLINICAL IMPRESSION:  Pt. continues to present with increased flexor tone, with significant tightness proximally through the right scapular and pectoral musculature, as well as shoulder musculature, wrist, and digit flexion. Pt. has strong extensor tone in the right elbow limiting elbow flexion. Pt. continues to benefit from OT services to normalize tone in the dominant RUE, improve ROM for underarm hygiene care and UE dressing, as well as to improve toileting care, and clothing negotiation tasks.     PERFORMANCE DEFICITS: in functional skills including ADLs, IADLs, coordination, dexterity, proprioception, sensation, ROM, strength, pain, Fine motor control, Gross motor control, balance, body mechanics, decreased knowledge of use of DME, and UE functional use, cognitive skills including problem solving, and psychosocial skills including coping  strategies, environmental adaptation, and routines and behaviors.   IMPAIRMENTS: are limiting patient from ADLs, IADLs, and leisure.   CO-MORBIDITIES: may have co-morbidities  that affects occupational performance. Patient will benefit from skilled OT to address above impairments and improve overall function.  MODIFICATION OR ASSISTANCE TO COMPLETE EVALUATION: Min-Moderate modification of tasks or assist with  assess necessary to complete an evaluation.  OT OCCUPATIONAL PROFILE AND HISTORY: Detailed assessment: Review of records and additional review of physical, cognitive, psychosocial history related to current functional performance.  CLINICAL DECISION MAKING: Moderate - several treatment options, min-mod task modification necessary  REHAB POTENTIAL: Good  EVALUATION COMPLEXITY: Moderate    PLAN:  OT FREQUENCY: 2x/week  OT DURATION: 12 weeks  PLANNED INTERVENTIONS: 97168 OT Re-evaluation, 97535 self care/ADL training, 16109 therapeutic exercise, 97530 therapeutic activity, 97112 neuromuscular re-education, 97140 manual therapy, 97018 paraffin, 60454 moist heat, 97010 cryotherapy, 97034 contrast bath, passive range of motion, coping strategies training, patient/family education, and DME and/or AE instructions  RECOMMENDED OTHER SERVICES: PT, ST  CONSULTED AND AGREED WITH PLAN OF CARE: Patient  PLAN FOR NEXT SESSION: see above  Duey Ghent, MS, OTR/L   01/23/2024, 10:29 AM

## 2024-01-23 NOTE — Therapy (Signed)
 OUTPATIENT PHYSICAL THERAPY NEURO TREATMENT  Patient Name: Lindsey [name] MRN: [id number] DOB:Feb 19, 1956, 68 y.o., female Today's Date: 01/23/2024   PCP: Lorina Roosevelt, MD  REFERRING PROVIDER: Liam Redhead, MD   END OF SESSION:   PT End of Session - 01/23/24 0950     Visit Number 15    Number of Visits 24    Date for PT Re-Evaluation 02/06/24    Progress Note Due on Visit 20    PT Start Time 1015    PT Stop Time 1100    PT Time Calculation (min) 45 min    Equipment Utilized During Treatment Gait belt   custom R LE AFO   Activity Tolerance Patient tolerated treatment well    Behavior During Therapy WFL for tasks assessed/performed                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   REFERRING DIAG:  Diagnosis  I69.398,R25.2 (ICD-10-CM) - Spasticity as late effect of cerebrovascular accident (CVA)    THERAPY DIAG:  Apraxia  Muscle weakness (generalized)  Other lack of coordination  Unsteadiness on feet  Abnormality of gait and mobility  Other abnormalities of gait and mobility  Difficulty in walking, not elsewhere classified  Rationale for Evaluation and Treatment: Rehabilitation  SUBJECTIVE:  SUBJECTIVE STATEMENT:  Pt with aphasia, responding primarily to yes/no questions. Pt confirms she is doing good and denies pain. Denies falls since last visit. Daughter waiting in cafeteria, but arrives half way through session.   From EVAL: Daughter present for Evaluation to provide hx. Pt is familiar to this clinic and has been seen for multiple bouts PT since initial CVA in 2021. Pt d/c'ed from PT serviced in April of 2024 with plans to receive PT services through [facility]. Daughter reports that she was only receiving minimal therapy in facility. Reports at least 1 fall in the last 6 months and daughter asked staff to transfer pt with lift since fall.  Daughter would want pt to demonstrate improve safety with transfers to allow bil transfers with reduced use of rail to allow pt to return home for assisted living.  Unable to discern whether pt in SNF or assisted living care from hx.    Pt accompanied by: self and family member   PERTINENT HISTORY:  1. Acute Ischemic Left MCA Stroke:Right Hemiplegia: Ordered outpatient OT, PT, and SLP. She has had a HFU appointment with Neurology. Discussed that I can provide new scripts next month if needed. Communicated with her providers regarding positive reinforcement. She has made excellent gains in  strength as well as cognition and speech!   -Provided with new disability placard.    2. Essential Hypertension: BP is currently very elevated and discussed with patient that it would be unsafe for her to receive injections today as the injections can temporarily increase her pain, which can increase her blood pressure, and put her at risk for stroke. Discussed starting the medication Valsartan  40mg  daily and logging blood pressures daily at home and she is agreeable. Medications reviewed and she is taking Amlodipine  5mg  and lopressor .  Provided with a list of foods that will help lower her BP.  BP reviewed and improved at 136/82 -continue clonidine -Advised checking BP daily at home and logging results to bring into follow-up appointment with PCP and myself. -Reviewed BP meds today.  -Advised regarding healthy foods that can help lower blood pressure and provided with a list:  PAIN:  Are you having pain? No  PRECAUTIONS: Fall  RED FLAGS: None   WEIGHT BEARING RESTRICTIONS: No  FALLS: Has patient fallen in last 6 months? Yes. Number of falls 1  LIVING ENVIRONMENT: Lives with: lives with their family and lives in a skilled nursing facility Lives in: Other SNF  Stairs: No Has following equipment at home: Hemi walker and Wheelchair (manual)  PLOF: Requires assistive device for independence, Needs assistance with ADLs, Needs assistance with homemaking, and WC level currently   PATIENT GOALS: move better - walking or transferring with hemi walking   OBJECTIVE:  Note: Objective measures were completed at Evaluation unless otherwise noted.  DIAGNOSTIC FINDINGS:  Head CT:  IMPRESSION: 1. No acute intracranial abnormality. 2. No acute displaced fracture or traumatic listhesis of the cervical spine.  Cervical CT:  MPRESSION: 1. No acute intracranial abnormality. 2. No acute displaced fracture or traumatic listhesis of the cervical spine.  COGNITION: Overall cognitive status:  History of cognitive impairments - at baseline   SENSATION: Light touch: Impaired  Proprioception: Impaired  Able to detect depe pressure   COORDINATION: Increased tone on the RLE   EDEMA:  Mild RLE distal edema   MUSCLE TONE: RLE: Mild and Moderate  MUSCLE LENGTH: Hamstrings: grossly limited    POSTURE: rounded shoulders, forward head, and posterior bias  LOWER EXTREMITY ROM:     Active  Right Eval Left Eval  Hip flexion  Limited to 5 deg beyond 90 in sitting  Knee flexion  90deg in sitting  Knee extension  Lacking 10 deg full extension  Ankle dorsiflexion  none  Ankle plantarflexion  none   (Blank rows = not tested)  LOWER EXTREMITY MMT:    MMT Right Eval Left Eval  Hip flexion  2+  Hip extension    Hip abduction  2  Hip adduction  3  Hip internal rotation    Hip external rotation    Knee flexion  2+  Knee extension  3+  Ankle dorsiflexion  0  Ankle plantarflexion  0  Ankle inversion    Ankle eversion    (Blank rows = not tested)  BED MOBILITY:  Sit to supine Mod A Supine to sit Mod A Rolling to Right Min A Rolling to Left Min A  TRANSFERS: Assistive device utilized: Hemi walker  Sit to stand: Mod A Stand to sit: Mod A Chair to chair: Mod A Floor:  unable to perform     CURB:  Level of Assistance: Total A Assistive device utilized:  rail Curb Comments: unable to perform   STAIRS: Unable to perform at eval   GAIT: Gait pattern:  non-functional constant posterior bias , step to pattern, decreased stance time- Right, Right hip hike, lateral hip instability, and lateral lean- Left Distance walked: 71ft Assistive device utilized: Hemi walker Level of assistance: Mod A Comments: moderate cues for sequencing of HW and gait pattern in turns   FUNCTIONAL TESTS:  5 times sit to stand: 48 sec with mod Assist from PT  Timed up and go (TUG): unable to perform  10 meter walk test: 6 ft with min-mod assist and HW.  FIST:  Function In Sitting  Test (FIST)  (1/2 femur on surface; hips/knees flexed to 90deg)   - indicate bed or mat table / step stool if used  SCORING KEY: 4 = Independent (completes task independently & successfully) 3 = Verbal Cues/Increased Time (completes task independently & successfully and only needs more time/cues) 2 = Upper Extremity Support (must use UE for support or assistance to complete successfully) 1 = Needs Assistance (unable to complete w/o physical assist; DOCUMENT LEVEL: min, mod, max) 0 = Dependent (requires complete physical assist; unable to complete successfully even w/ physical assist)  Randomly Administer Once Throughout Exam  4 - Anterior Nudge (superior sternum)  4 - Posterior Nudge (between scapular spines)  4 - Lateral Nudge (to dominant side at acromion)     4 - Static sitting (30 seconds)  4 - Sitting, shake 'no' (left and right)  4 - Sitting, eyes closed (30 seconds)   3 - Sitting, lift foot (dominant side, lift foot 1 inch twice)    4 - Pick up object from behind (object at midline, hands breadth posterior)  1 mod assist - Forward reach (use dominant arm, must complete full motion) 1 mod assist - Lateral reach (use dominant arm, clear opposite ischial tuberosity) 1 mod assist  - Pick up object from floor (from between feet)   1 mod assist - Posterior scooting (move backwards 2 inches)  1 min assist  - Anterior scooting (move forward 2 inches)  1 min assist  - Lateral scooting (move to dominant side 2 inches)    TOTAL = 37/56  Notes/comments: apraxia limiting success of scooting and reacting tasks.  MCD > 5 points MCID  for IP REHAB > 6 points      PATIENT SURVEYS:  To be completed                                                                                                                               TREATMENT DATE: 01/23/2024  Pt arrives to therapy in wheelchair. Pt wearing her custom R LE AFO.  Started with R knee flexion AAROM/PROM for quad tone  management and to allow improved R foot placement underneath BOS prior to initiating transfer training - x10reps.   Repeated sit<>stands from w/c to L UE support on balance bar x5 reps - focused on carryover of proper motor planning to push up from w/c armrest when going to stand and then reach back for armrest when going to sit - only requires up to heavy min A today with improved anterior trunk lean when rising - repeated cuing to flex trunk forward and flex hips to move hips posteriorly when going to sit (occasionally pt keeps trunk/hips extended with posterior lean when going to sit).   Daughter arrived and requested therapist assist with planning transfer techniques patient can use at the SNF rather than SNF staff using the hoyer lift full time. Pt's daughter reports she, herself, assists pt with stand pivots to/from toilet using L UE support on grab bar. Therapist requested daughter take pictures of the set-up of patient's bathroom and bedroom at SNF to assist with planning safe transfer techniques for pt to perform with SNF staff. Discussed option of having daughter video tape therapist educating nursing staff on how to safely assist patient with transfers at future appointment. Educated DTR on pt's need to have assistance performing sit>stand transfers at least 1x every hour for pressure relief of buttocks for skin integrity  Stand pivots w/c<>EOM using L UE support on balance bar with skilled min A for balance and weight shifting as well as for R LE stepping, especially when transferring towards R side.   Gait training 36ft + 23ft (seated break between) using + 2 L HHA providing heavy min/light modA and therapist providing skilled mod assist for R LE management and balance on the R. Patient's DTR providing wheelchair follow for safety during.  Pt demonstrating the following gait deviations with therapist providing the described cuing and facilitation for improvement:  *provided mirror feedback  throughout  - requires cuing/facilitation for L weight shift onto L stance limb to allow R advancement during swing - mod assist manual facilitation for R LE advancement and placement during swing  - able to achieve reciprocal stepping pattern >50% of the time - excessively wide BOS - repeated cuing to activate hip extensors to achieve upright posture - cuing for forward gaze to utilize mirror feedback - therapist guarding R knee during stance but no knee instability, rather stays in full extension or partial hyperextension, but does require facilitation for R hip extension activation and R pelvis advancement over R stance phase  Pt excited by amount of gait training performed today!   PATIENT EDUCATION: Education details: POC.  Pt educated throughout session about proper posture and technique with exercises. Improved exercise technique, movement at target joints, use of target muscles after min to mod verbal, visual, tactile cues.   Person educated: Patient and Child(ren) Education method: Explanation Education comprehension: verbalized understanding  HOME EXERCISE PROGRAM: Access Code: [email or web address] URL: [email or web address] Date: 12/05/2023 Prepared by: Aurora Lees  Exercises - Sit to Stand with Counter Support  - 1 x daily - 3-4 x weekly - 2 sets - 10 reps - Seated Long Arc Quad  - 1 x daily - 3-4 x weekly - 2 sets - 10 reps - 2 hold - Seated March  - 1 x daily - 3-4 x weekly - 2 sets - 10 reps - 2 hold - Standing Hip Abduction with Counter Support  - 1 x daily - 7 x weekly - 3 sets - 5 reps - Wide Stance with Counter Support  - 1 x daily - 7 x weekly - 3 sets - 2 reps - 30 hold - Seated Hip Abduction  - 1 x daily - 7 x weekly - 3 sets - 10 reps - Supine Bridge  - 1 x daily - 7 x weekly - 3 sets - 10 reps - Supine Short Arc Quad  - 1 x daily - 7 x weekly - 3 sets - 10 reps - Bent Knee Fallouts  - 1 x daily - 7 x weekly - 3 sets - 10 reps - Small Range Straight Leg  Raise  - 1 x daily - 7 x weekly - 3 sets - 10 reps - Supine Heel Slide  - 1 x daily - 7 x weekly - 3 sets - 10 reps   GOALS: Goals reviewed with patient? Yes  SHORT TERM GOALS: Target date: 12/13/2023    Patient will be independent in home exercise program to improve strength/mobility for better functional independence with ADLs. Baseline: to be provided on visit 2: 12/28/2023- No caregiver with patient to determine if compliant with HEP Goal status: INITIAL   LONG TERM GOALS: Target date: 02/07/2024     2.  Patient (> 16 years old) will complete five times sit to stand test by 15 seconds and only min assist indicating an increased LE strength and improved balance. Baseline: 48 with mod assist from PT; 12/28/2023= 44 sec from EOM at lowest postion (approx 21 in) with Left UE support and CGA to min A from PT Goal status: Progressing 3.  Patient will increase FIST score by > 6 points to demonstrate decreased fall risk during functional activities Baseline: 37 Goal status: INITIAL  4.  Patient will complete 10 meter walk test to with HW in less than 1 minute with min assist as to improve gait speed for better community ambulation and to reduce fall risk. Baseline: 6 ft with HW and mod assist; 12/28/2023=18 feet with HW- mod assist  Goal status: PROGRESSING  5.  Patient will perform stand pivot transfer to and from [facility] with HW and CGA for safety to allow safe transfer to toilet with daughter without use of rail on the wall. Baseline: mod assist and heavy use of rail. 12/28/2023= Min to Mod assist with repetitive VC for technique Goal status: Progressing  6.  Patient will improve bed mobility to min assist only to reduce care giver burden and improve safety of transfers  Baseline: mod-max assist Goal status: INITIAL  ASSESSMENT:  CLINICAL IMPRESSION:  Patient presents with good motivation for therapy appointment. Therapy session focused on carryover of proper motor planning sit<>stand  transfers using L UE support on wheelchair armrest to improve proper trunk movement with pt consistently performing with only min A! Participated in gait training using L HHA with +2 mod A for balance and R LE management with pt ambulating 82ft + 43ft today! Patient will benefit from continuation of increased intensity of gait training to promote neural recovery. Patient's daughter requesting assistance with training nursing staff on safe transfer technique to use at SNF so the patient is not having to constantly be transferred via hoyer lift - planning to address in upcoming visits. Ms. Ucci will benefit from continued skilled PT to address strength and balance deficits to improve safety for transfers, reduce care giver burden, and allow return to PLOF and possible return home from Assisted living.    OBJECTIVE IMPAIRMENTS: Abnormal gait, cardiopulmonary status limiting activity, decreased activity tolerance, decreased balance, decreased cognition, decreased coordination, decreased endurance, decreased knowledge of use of DME, decreased mobility, difficulty walking, decreased ROM, decreased strength, hypomobility, increased muscle spasms, impaired flexibility, impaired sensation, impaired tone, impaired UE functional use, improper body mechanics, and postural dysfunction.   ACTIVITY LIMITATIONS: carrying, lifting, bending, standing, squatting, stairs, transfers, bed mobility, continence, bathing, toileting, dressing, reach over head, hygiene/grooming, and locomotion level  PARTICIPATION LIMITATIONS: cleaning, interpersonal relationship, shopping, and community activity  PERSONAL FACTORS: 3+ comorbidities: HTN, chronic CVA, Afib, Lupus   are also affecting patient's functional outcome.   REHAB POTENTIAL: Fair chronic CVA with poor progress in prior interventions with this clinic  CLINICAL DECISION MAKING: Unstable/unpredictable  EVALUATION COMPLEXITY: High  PLAN:  PT FREQUENCY: 1-2x/week  PT  DURATION: 12 weeks  PLANNED INTERVENTIONS: 97110-Therapeutic exercises, 97530- Therapeutic activity, [id number]- Neuromuscular re-education, 97535- Self Care, 29562- Manual therapy, [phone]- Gait training, [phone]- Orthotic Fit/training, [phone]- Aquatic Therapy, [phone]- Splinting, 97014- Electrical stimulation (unattended), [phone]- Electrical stimulation (manual), Patient/Family education, Balance training, Stair training, Joint mobilization, Vestibular training, Visual/preceptual remediation/compensation, Cognitive remediation, DME instructions, Wheelchair mobility training, Cryotherapy, and Moist heat  PLAN FOR NEXT SESSION:  - address safe transfer technique patient can perform with staff at SNF   - provide education to patient's DTR and/or video technique so she can show SNF staff - gait training with +2 L HHA for increased distance using mirror feedback - stretching of quads for tone management to improve knee flexion ROM to allow proper foot positioning underneath BOS during transfers - stretching hip flexors to improve upright posture in standing -  R glute activation and strength during standing  Continue  standing balance. Gait. Transfers.     Carlen Chasten, PT, DPT, NCS, CSRS Physical Therapist -   [facility]  12:47 PM 01/23/24

## 2024-01-23 NOTE — Therapy (Signed)
 OUTPATIENT SPEECH LANGUAGE PATHOLOGY  TREATMENT NOTE   Patient Name: Lindsey [name] MRN: [id number] DOB:[birth year], 68 y.o., female Today's Date: 01/23/2024  PCP: Mat Solian, MD REFERRING PROVIDER: Laverle Postin, MD    End of Session - 01/23/24 0913     Visit Number 12    Number of Visits 24    Date for SLP Re-Evaluation 02/06/24    Authorization Type Medicare Part A/ Medicare Part B    Progress Note Due on Visit 20    SLP Start Time 0845    SLP Stop Time  0930    SLP Time Calculation (min) 45 min    Activity Tolerance Patient tolerated treatment well               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) 09/24/2021   Cerebral embolism with cerebral infarction 09/23/2021   Pyelonephritis 09/19/2021   Pain due to onychomycosis of toenails of both feet 11/19/2020   Normocytic anemia 05/31/2020   Acute ischemic left MCA stroke (HCC) 04/30/2020   Right hemiplegia (HCC) 04/30/2020   Cerebrovascular accident (HCC) 04/21/2020   Atrial fibrillation with RVR (HCC) 04/21/2020   Essential hypertension 04/21/2020   Alcohol abuse 04/21/2020    ONSET DATE: 04/30/2020 initial CVA; 10/25/2020  second CVA;  11/07/2023 date of  referral  REFERRING DIAG: R47.01 (ICD-10-CM) - Aphasia   THERAPY DIAG:  Aphasia  Cognitive communication deficit  Apraxia  Rationale for Evaluation and Treatment Rehabilitation  SUBJECTIVE:   PERTINENT HISTORY and DIAGNOSTIC FINDINGS:   Patient is a 68 y.o. female with the following prior history:  MRI 04/21/2020 Brain: Acute infarct left MCA territory. Infarct involves the left frontal lobe, insula as well as the caudate and putamen. There are areas of hemorrhage within the caudate and putamen as well as in the left frontal lobe.  Scattered small white matter hyperintensities elsewhere compatible with chronic microvascular ischemia. Ventricle size normal. Negative for mass lesion.  Inpatient Rehabilitation ST Services: 04/2020 thru 05/2020 Outpatient ST services 08/2020 thru 08/2021 Pt discharged with Lingraphica Device; per chart using device to assist with communication  MRI 09/22/2021 IMPRESSION: 1. Acute moderate size Left AICA cerebellar infarct. Petechial hemorrhage but no malignant hemorrhagic transformation. Cytotoxic edema but no posterior fossa mass effect at this time.  2. Underlying extensive chronic left MCA territory encephalomalacia related to the 2021 infarct. Associated left brainstem Wallerian degeneration since last year. And small superimposed chronic Left PICA cerebellar infarcts, but also new since last year.   Inpatient Rehabilitation ST Services at [facility]: 10/02/2021 thru 10/21/2021 Outpatient ST Services [facility] 10/2021 thru 03/08/2022 Pt discharged d/t reaching maximal rehabilitation potential; "3/4 LTGs particially met due to limited caregiver  availability"  Pt is currently residing at a SNF [facility]). Additional medical history includes alcoholism,  AFib, Renal Artery thrombosis, situational depression, HTN, Pyelnephritis, Seizure activity, Constipation, AKI. Vitamin D  deficiency, and urinary incontinence.   PAIN:  Are you having  pain? No  FALLS: Has patient fallen in last 6 months?  See PT evaluation for details  LIVING ENVIRONMENT: Lives with: lives with their family and lives in a skilled nursing facility   PLOF:  Level of assistance: Needed assistance with ADLs, Needed assistance with IADLS   PATIENT GOALS    pt unable to state - her daughter reports she would like for pt to be able to "communicate basics, return to abilities before second stroke (09/2021)"  SUBJECTIVE STATEMENT: Pt eager, says she had a good Easter Pt accompanied by: self  OBJECTIVE:   TODAY'S TREATMENT:     Skilled treatment session focused on her communication goals. SLP facilitated the session by providing the following interventions:  Pt stated "I went to my daughter's" when asked what she did for Easter  Constant Therapy Clinician App was utilized to target pt's goals Understanding words you hear - Level 1 80% with Min A -  Understanding words you read - Level 1 - 80% improved to 90% with Min A Repeat the word you hear - Level 1 - 60% intelligibility d/t phonemic errors likely related to apraxia of speech and aphasia  PATIENT EDUCATION: Education details: use of low tech communication  Person educated: Patient and Child(ren) Education method: Explanation Education comprehension: needs further education  HOME EXERCISE PROGRAM:   TalkPath Therapy App Increase her involvement in activities provided by facility   GOALS:  Goals reviewed with patient? Yes  SHORT TERM GOALS: Target date: 10 sessions  Given verbal or visual prompt, pt will correctly ID written word/object/icon in field of 4-6 to communicate wants/needs with 75% accuracy given occasional mod A over 2 sessions  Baseline: Goal status: INITIAL: progress made, staff is reportedly using at nursing home   LONG TERM GOALS: Target date: 02/06/2024  Pt will utilize multimodal communication to express basic wants/needs as reported by pt and her daughter in 3 out  of 5 opportunities.  Baseline:  Goal status: INITIAL: progress made   ASSESSMENT:  CLINICAL IMPRESSION: Patient is a 68 y.o. right handed female who was seen today for a speech language treatment d/t history of Left MCA CVA. 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 Her verbal language Is halting and while she does produce some islands of improved speech intelligibility during naming tasks and spontaneous responses, she speech contains neologisms with evidence of word finding difficulty. Pt's daughter reports that pt doesn't use her Lingraphica device "as much as she should" and it is only available to her during weekends at home for fear that it might be lost at pt's facility.   See the above treatment note for details.   OBJECTIVE IMPAIRMENTS include attention, aphasia, apraxia, and dysarthria. These impairments are limiting patient from effectively communicating at home and in community. Factors affecting potential to achieve goals and functional outcome are ability to learn/carryover information, co-morbidities, cooperation/participation level, medical prognosis, previous level of function, severity of impairments, family/community support, and time post onset . Patient will benefit from skilled SLP services to address above impairments and improve overall function.  REHAB POTENTIAL: Fair time post onset, multiple courses of ST services, decreased carryover of therapy concepts  during previous sessions; pt discharged from previous course of ST services (2023) requiring moderate assistance for communication  PLAN: SLP FREQUENCY: 1-2x/week  SLP DURATION: 8 weeks  PLANNED INTERVENTIONS: Language facilitation, Internal/external aids, Functional tasks, Multimodal communication approach, SLP instruction and feedback, Compensatory strategies, Patient/family education, and [address] Treatment of speech  (30 or 45 min)     Seleni Meller B. Garlin Junker, M.S., CCC-SLP, Tree surgeon Certified Brain Injury Specialist [facility]  [facility] Rehabilitation Services Office [phone] Ascom [phone] Fax [phone]

## 2024-01-25 ENCOUNTER — Ambulatory Visit: Payer: Medicare Other | Admitting: Physical Therapy

## 2024-01-25 ENCOUNTER — Ambulatory Visit: Payer: Medicare Other | Admitting: Speech Pathology

## 2024-01-25 ENCOUNTER — Ambulatory Visit: Payer: Medicare Other | Admitting: Occupational Therapy

## 2024-01-25 DIAGNOSIS — R2681 Unsteadiness on feet: Secondary | ICD-10-CM

## 2024-01-25 DIAGNOSIS — R482 Apraxia: Secondary | ICD-10-CM

## 2024-01-25 DIAGNOSIS — R269 Unspecified abnormalities of gait and mobility: Secondary | ICD-10-CM | POA: Diagnosis not present

## 2024-01-25 DIAGNOSIS — R278 Other lack of coordination: Secondary | ICD-10-CM

## 2024-01-25 DIAGNOSIS — M6281 Muscle weakness (generalized): Secondary | ICD-10-CM

## 2024-01-25 DIAGNOSIS — R4701 Aphasia: Secondary | ICD-10-CM

## 2024-01-25 DIAGNOSIS — R262 Difficulty in walking, not elsewhere classified: Secondary | ICD-10-CM

## 2024-01-25 DIAGNOSIS — R2689 Other abnormalities of gait and mobility: Secondary | ICD-10-CM

## 2024-01-25 NOTE — Therapy (Signed)
 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) 09/24/2021   Cerebral embolism with cerebral infarction 09/23/2021   Pyelonephritis 09/19/2021   Pain due to onychomycosis of toenails of both feet 11/19/2020   Normocytic anemia 05/31/2020   Acute ischemic left MCA stroke (HCC) 04/30/2020   Right hemiplegia (HCC) 04/30/2020   Cerebrovascular accident (HCC) 04/21/2020   Atrial fibrillation with RVR (HCC) 04/21/2020   Essential hypertension 04/21/2020   Alcohol abuse 04/21/2020   ONSET DATE: 09/19/21  REFERRING DIAG: CVA  THERAPY DIAG:  Muscle weakness (generalized)  Rationale for  Evaluation and Treatment: Rehabilitation  SUBJECTIVE:  SUBJECTIVE STATEMENT: Pt. reports  that she has to get better. Pt Pt accompanied by: self, daughter  PERTINENT HISTORY: Pt. is a 68 y.o. 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 Pt. has Right sided weakness, and receptive, and expressive aphasia. Pt. has had a previous CVA with right sided weakness in July 2021. PMHx includes: AFib, Renal Artery thrombosis, situational depression, HTN, Pyelnephritis, Seizure activity, COnstipation, AKI. Vitamin D  deficiency, and urinary incontinence   PRECAUTIONS: None  WEIGHT BEARING RESTRICTIONS: No  PAIN:  Are you having pain? 4/10 FACES pain in the right shoulder   FALLS: Has patient fallen in last 6 months? Yes. Number of falls 1-ER visit  LIVING ENVIRONMENT: Pt. resides at [facility]. Pt. has very supportive family Stairs: One level Has following equipment at home:  Equipment provided through [employer]  PLOF: Needs assistance with ADLs  PATIENT GOALS:  To improve ROM/flexibility in the RUE 2/2 increased tightness, and to improve ADL functioning in order to towards returning home.  OBJECTIVE:  Note: Objective measures were completed at Evaluation unless otherwise noted.  HAND DOMINANCE: Right  ADLs: Overall ADLs: Assist is provided from the staff at [facility]. Transfers/ambulation related to ADLs: Eating: independent, with set-up using the left hand Grooming: Independent with set-up using the left hand UB Dressing: Increased assist required, Mod-MaxA  doffing jacket LB Dressing: Increased assist required Toileting:  Staff Assists with toileting transfers, and toilet hygiene care. Bathing: Staff assists with bathing Tub Shower transfers: Requires staff assist Equipment: TBD  IADLs: Shopping: N/A Light housekeeping: Provided by staff Meal Prep: 3 Meals a day provided. Pt. Eats in the main  dining room when family visits, otherwise Pt. Eats in her room.  Community mobility: Relies on family for transportation to therapy. Per daughter- Transportation is provided for services not offered at the facility. Medication management: Provided by the facility Financial management: Daughter manages Handwriting: TBD  MOBILITY STATUS: Hx of falls  FUNCTIONAL OUTCOME MEASURES:  UPPER EXTREMITY ROM:    Passive ROM Left Eval AROM WFL Right Eval  Right 01/02/24  Shoulder flexion  0(46) 25 (90)  Shoulder abduction  0(64) 0 (90)  Shoulder adduction     Shoulder extension     Shoulder internal rotation     Shoulder external rotation   (25)  Elbow flexion  0(98) 0(131)  Elbow extension  0(full passive) 0 (full passive)  Wrist flexion  0(56) 0(90)  Wrist extension  -56(30) 0 (40)  Wrist ulnar deviation     Wrist radial deviation     Wrist pronation     Wrist supination     (Blank rows = not tested)  UPPER EXTREMITY MMT:     MMT Left Eval 5/5 Right eval Right  01/02/24  Shoulder flexion  0/5 2/5   Shoulder abduction  0/5 1/5  Shoulder adduction   2-/5  Shoulder extension     Shoulder internal rotation   1/5  Shoulder external rotation     Middle trapezius     Lower trapezius     Elbow flexion  0/5 0/5  Elbow extension  0/5 2/5  Wrist flexion   1/5  Wrist extension  0/5 3-/5  Wrist ulnar deviation     Wrist radial deviation     Wrist pronation  0/5 0/5  Wrist supination  0/5 0/5  Digit flexors   2-/5  Digit extensors   0/5  (Blank rows = not tested)  HAND FUNCTION: N/A  COORDINATION: N/A  SENSATION: Diminished  EDEMA: No edema noted in the right hand today  MUSCLE TONE: impaired  COGNITION: Overall cognitive status: Aphasia  VISION: Wears glasses, no change from baseline  PERCEPTION: TBD  PRAXIS: Impaired: Motor planning                                                                                                                            TREATMENT DATE: 01/25/24    Manual therapy:   -Soft tissue massage to the scapular, pectoral, and right shoulder musculature. -Soft tissue mobilizations to promote radius on ulna motion needed for supination.  -Manual therapy was performed independent of, and in preparation for therapeutic Ex.  Therapeutic Exercise:   -PROM through the following joint ranges of the RUE, including: shoulder flexion, abduction, ER, horizontal abduction, elbow flexion,  extension, forearm supination, wrist extension, digit extension, and thumb abduction, working to increase RUE mobility to manage hemiparetic limb during basic ADLs, as well as reducing contracture risk. Janine Melbourne for R shoulder flex/ext, horizontal  abduction/adduction. -Cues, and hand over hand assist were required for self-ROM HEP for the UE.   PATIENT EDUCATION: Education details: RUE PROM/AAROM, Pt./caregiver education about possible restorative ROM programs at SNF Person educated: Patient Education method: Explanation, Demonstration, Tactile cues, and Verbal cues Education comprehension: verbal cues required, tactile cues required, and needs further education  HOME EXERCISE PROGRAM: RUE self PROM  GOALS: Goals reviewed with patient? Yes  SHORT TERM GOALS: Target date: 01/16/2024    Pt. Will demonstrate supervision with HEPs for BUEs Baseline: Eval: No current HEPs; 01/02/24: Pt demos self PROM techniques throughout the RUE with intermittent min A and mod vc for accuracy Goal status: in progress  LONG TERM GOALS: Target date: 02/07/2024    1.  Pt. Will improve right shoulder flexion to 45* to assist with UE dressing Baseline: Eval: Right: 0(46); 01/02/24: Right: 25 (90) Goal status: revised on 01/02/24 d/t 20* increase met/ in progress  2.  Pt. Will improve with shoulder abduction by 20 degrees to assist with underarm care, and hygiene Baseline: Eval: Right 0(64); 01/02/24: Right 0 (90) Goal status: achieved  3.  Pt. Will improve right  elbow flexion by 10 degrees to assist with hand to face patterns for grooming Baseline: Eval: Right: 0(98); 01/02/24: Right: 0(131) Goal status: in progress  4.  Pt. Will elicit active wrist extension, and digit extension in preparation for rubbing lotion into the left forearm, and hand  Baseline: Eval: Right wrist: -56 (30); 01/02/24: R wrist: ext: actively to neutral/0 (passively to 40*); R digit extension: no active ext (passively [facility] for this task); unable to self apply lotion to L forearm Goal status: in progress  5.  Pt. Will perform UE dressing skills with MinA Baseline: Eval: Mod-MaxA doffing a jacket; 01/02/24: Mod A to don/doff a jacket Goal status: in progress  6. Pt. Will require minA toileting transfers/ clothing management Baseline: Eval: Pt. Requires increased assist from staff; 01/02/24: pt can transfer to standard height toilet with grab bar for support with min A, but requires mod A to transfer up from toilet; Pt consistently performs wc<>BSC transfer with min A, extra time, and vc for sequencing while using hemi walker; total A for clothing management.  Goal status: in progress  ASSESSMENT:  CLINICAL IMPRESSION:  Pt.'s daughter was present during therapy today, and was encouraged to talk to the Pt.'s SNF about restorative/ROM programs available for the Pt. As Pt. Needs daily thorough ROM to the RUE 2/2 tightness, and requires assist to perform it thoroughly for herself. Pt. continues to present with increased flexor tone, with significant tightness proximally through the right scapular and pectoral musculature, as well as shoulder musculature, wrist, and digit flexion. Pt. continues to present with strong extensor tone in the right elbow limiting elbow flexion. Pt. continues to benefit from OT services to normalize tone in the dominant RUE, improve ROM for underarm hygiene care and UE dressing, as well as to improve toileting care, and clothing negotiation tasks.     PERFORMANCE  DEFICITS: in functional skills including ADLs, IADLs, coordination, dexterity, proprioception, sensation, ROM, strength, pain, Fine motor control, Gross motor control, balance, body mechanics, decreased knowledge of use of DME, and UE functional use, cognitive skills including problem solving, and psychosocial skills including coping strategies, environmental adaptation, and routines and  behaviors.   IMPAIRMENTS: are limiting patient from ADLs, IADLs, and leisure.   CO-MORBIDITIES: may have co-morbidities  that affects occupational performance. Patient will benefit from skilled OT to address above impairments and improve overall function.  MODIFICATION OR ASSISTANCE TO COMPLETE EVALUATION: Min-Moderate modification of tasks or assist with assess necessary to complete an evaluation.  OT OCCUPATIONAL PROFILE AND HISTORY: Detailed assessment: Review of records and additional review of physical, cognitive, psychosocial history related to current functional performance.  CLINICAL DECISION MAKING: Moderate - several treatment options, min-mod task modification necessary  REHAB POTENTIAL: Good  EVALUATION COMPLEXITY: Moderate    PLAN:  OT FREQUENCY: 2x/week  OT DURATION: 12 weeks  PLANNED INTERVENTIONS: 97168 OT Re-evaluation, 97535 self care/ADL training, 02725 therapeutic exercise, 97530 therapeutic activity, 97112 neuromuscular re-education, 97140 manual therapy, 97018 paraffin, 36644 moist heat, 97010 cryotherapy, 97034 contrast bath, passive range of motion, coping strategies training, patient/family education, and DME and/or AE instructions  RECOMMENDED OTHER SERVICES: PT, ST  CONSULTED AND AGREED WITH PLAN OF CARE: Patient  PLAN FOR NEXT SESSION: see above  Duey Ghent, MS, OTR/L   01/25/2024, 4:43 PM

## 2024-01-25 NOTE — Therapy (Signed)
 OUTPATIENT SPEECH LANGUAGE PATHOLOGY  TREATMENT NOTE   Patient Name: Lindsey [name] MRN: [id number] DOB:May 18, 1956, 68 y.o., female Today's Date: 01/25/2024  PCP: Mat Solian, MD REFERRING PROVIDER: Laverle Postin, MD    End of Session - 01/25/24 1142     Visit Number 13    Number of Visits 24    Date for SLP Re-Evaluation 02/06/24    Authorization Type Medicare Part A/ Medicare Part B    Progress Note Due on Visit 20    SLP Start Time 0800    SLP Stop Time  0845    SLP Time Calculation (min) 45 min    Activity Tolerance Patient tolerated treatment well               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) 09/24/2021   Cerebral embolism with cerebral infarction 09/23/2021   Pyelonephritis 09/19/2021   Pain due to onychomycosis of toenails of both feet 11/19/2020   Normocytic anemia 05/31/2020   Acute ischemic left MCA stroke (HCC) 04/30/2020   Right hemiplegia (HCC) 04/30/2020   Cerebrovascular accident (HCC) 04/21/2020   Atrial fibrillation with RVR (HCC) 04/21/2020   Essential hypertension 04/21/2020   Alcohol abuse 04/21/2020    ONSET DATE: 04/30/2020 initial CVA; 10/25/2020  second CVA;  11/07/2023 date of  referral  REFERRING DIAG: R47.01 (ICD-10-CM) - Aphasia   THERAPY DIAG:  Aphasia  Apraxia  Rationale for Evaluation and Treatment Rehabilitation  SUBJECTIVE:   PERTINENT HISTORY and DIAGNOSTIC FINDINGS:   Patient is a 68 y.o. female with the following prior history:  MRI 04/21/2020 Brain: Acute infarct left MCA territory. Infarct involves the left frontal lobe, insula as well as the caudate and putamen. There are areas of hemorrhage within the caudate and putamen as well as in the left frontal lobe.  Scattered small white matter hyperintensities elsewhere compatible with chronic microvascular ischemia. Ventricle size normal. Negative for mass lesion.  Inpatient Rehabilitation ST Services: 04/2020 thru 05/2020 Outpatient ST services 08/2020 thru 08/2021 Pt discharged with Lingraphica Device; per chart using device to assist with communication  MRI 09/22/2021 IMPRESSION: 1. Acute moderate size Left AICA cerebellar infarct. Petechial hemorrhage but no malignant hemorrhagic transformation. Cytotoxic edema but no posterior fossa mass effect at this time.  2. Underlying extensive chronic left MCA territory encephalomalacia related to the 2021 infarct. Associated left brainstem Wallerian degeneration since last year. And small superimposed chronic Left PICA cerebellar infarcts, but also new since last year.   Inpatient Rehabilitation ST Services at [facility]: 10/02/2021 thru 10/21/2021 Outpatient ST Services [facility] 10/2021 thru 03/08/2022 Pt discharged d/t reaching maximal rehabilitation potential; "3/4 LTGs particially met due to limited caregiver availability"  Pt is  currently residing at a SNF [facility]). Additional medical history includes alcoholism,  AFib, Renal Artery thrombosis, situational depression, HTN, Pyelnephritis, Seizure activity, Constipation, AKI. Vitamin D  deficiency, and urinary incontinence.   PAIN:  Are you having pain? No  FALLS: Has patient fallen  in last 6 months?  See PT evaluation for details  LIVING ENVIRONMENT: Lives with: lives with their family and lives in a skilled nursing facility   PLOF:  Level of assistance: Needed assistance with ADLs, Needed assistance with IADLS   PATIENT GOALS    pt unable to state - her daughter reports she would like for pt to be able to "communicate basics, return to abilities before second stroke (09/2021)"  SUBJECTIVE STATEMENT: Pt eager, accompanied by her daughter Pt accompanied by: self and her daughter  OBJECTIVE:   TODAY'S TREATMENT:     Skilled treatment session focused on her communication goals. SLP facilitated the session by providing the following interventions:  Pt answered "yes' when asked "are you using this (pointing to low tech communication board) to communicate?"  SLP facilitated session by using Talk[employer] Therapy to target potential activities for her daughter to use when visiting.   Matching Level 2 with word repetition - 90% accuracy with rare Min A Word ID targeting reading - level 1 - identifying picture in field of 2 - 80%   PATIENT EDUCATION: Education details: use of low tech communication  Person educated: Patient and Child(ren) Education method: Explanation Education comprehension: needs further education  HOME EXERCISE PROGRAM:   TalkPath Therapy App Increase her involvement in activities provided by facility   GOALS:  Goals reviewed with patient? Yes  SHORT TERM GOALS: Target date: 10 sessions  Given verbal or visual prompt, pt will correctly ID written word/object/icon in field of 4-6 to communicate wants/needs with 75% accuracy given occasional mod A over 2 sessions  Baseline: Goal status: INITIAL: progress made, staff is reportedly using at nursing home   LONG TERM GOALS: Target date: 02/06/2024  Pt will utilize multimodal communication to express basic wants/needs as reported by pt and her daughter in 3 out of 5 opportunities.   Baseline:  Goal status: INITIAL: progress made   ASSESSMENT:  CLINICAL IMPRESSION: Patient is a 68 y.o. right handed female who was seen today for a speech language treatment d/t history of Left MCA CVA. 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 Her verbal language Is halting and while she does produce some islands of improved speech intelligibility during naming tasks and spontaneous responses, she speech contains neologisms with evidence of word finding difficulty. Pt's daughter reports that pt doesn't use her Lingraphica device "as much as she should" and it is only available to her during weekends at home for fear that it might be lost at pt's facility.   See the above treatment note for details.   OBJECTIVE IMPAIRMENTS include attention, aphasia, apraxia, and dysarthria. These impairments are limiting patient from effectively communicating at home and in community. Factors affecting potential to achieve goals and functional outcome are ability to learn/carryover information, co-morbidities, cooperation/participation level, medical prognosis, previous level of function, severity of impairments, family/community support, and time post onset . Patient will benefit from skilled SLP services to address above impairments and improve overall function.  REHAB POTENTIAL: Fair time post onset, multiple courses of ST services, decreased carryover of therapy concepts during previous sessions; pt discharged from previous course of ST  services (2023) requiring moderate assistance for communication  PLAN: SLP FREQUENCY: 1-2x/week  SLP DURATION: 8 weeks  PLANNED INTERVENTIONS: Language facilitation, Internal/external aids, Functional tasks, Multimodal communication approach, SLP instruction and feedback, Compensatory strategies, Patient/family education, and [id number] Treatment of speech (30 or 45 min)      Syed Zukas B. Garlin Junker, M.S., CCC-SLP, Tree surgeon Certified Brain Injury Specialist [facility]  [facility] Rehabilitation Services Office [phone] Ascom [phone] Fax [phone]

## 2024-01-25 NOTE — Therapy (Signed)
 OUTPATIENT PHYSICAL THERAPY NEURO TREATMENT  Patient Name: Lindsey [name] MRN: [id number] DOB:[birth year], 68 y.o., female Today's Date: 01/25/2024   PCP: Lindsey Roosevelt, MD  REFERRING PROVIDER: Liam Redhead, MD   END OF SESSION:   PT End of Session - 01/25/24 9562     Visit Number 16    Number of Visits 24    Date for PT Re-Evaluation 02/06/24    Progress Note Due on Visit 20    PT Start Time 0847    PT Stop Time 0927    PT Time Calculation (min) 40 min    Equipment Utilized During Treatment Gait belt   custom R LE AFO   Activity Tolerance Patient tolerated treatment well    Behavior During Therapy WFL for tasks assessed/performed                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   REFERRING DIAG:  Diagnosis  I69.398,R25.2 (ICD-10-CM) - Spasticity as late effect of cerebrovascular accident (CVA)    THERAPY DIAG:  Apraxia  Muscle weakness (generalized)  Other lack of coordination  Unsteadiness on feet  Abnormality of gait and mobility  Difficulty in walking, not elsewhere classified  Other abnormalities of gait and mobility  Rationale for Evaluation and Treatment: Rehabilitation  SUBJECTIVE:  SUBJECTIVE STATEMENT:  Pt with aphasia, responding primarily to yes/no questions. Pt confirms she is doing good and denies pain. Denies falls since last visit. Daughter presnet through session. States that she did not remember or have time to take pictures of bathroom set up to practice toilet transfers.    From EVAL: Daughter present for Evaluation to provide hx. Pt is familiar to this clinic and has been seen for multiple bouts PT since initial CVA in 2021. Pt d/c'ed from PT serviced in April of 2024 with plans to receive PT services through [facility]. Daughter reports that she was only receiving minimal therapy in facility. Reports at least 1 fall in the last 6 months and daughter asked staff to transfer pt with lift since fall.  Daughter would want pt to demonstrate improve safety with transfers to allow bil transfers with reduced use of rail to allow pt to return home for assisted living.  Unable to discern whether pt in SNF or assisted living care from hx.    Pt accompanied by: self and family member   PERTINENT HISTORY:  1. Acute Ischemic Left MCA Stroke:Right Hemiplegia: Ordered outpatient OT, PT, and SLP. She has had a HFU appointment with Neurology. Discussed that I can provide new scripts next month if needed. Communicated with her  providers regarding positive reinforcement. She has made excellent gains in strength as well as cognition and speech!   -Provided with new disability placard.    2. Essential Hypertension: BP is currently very elevated and discussed with patient that it would be unsafe for her to receive injections today as the injections can temporarily increase her pain, which can increase her blood pressure, and put her at risk for stroke. Discussed starting the medication Valsartan  40mg  daily and logging blood pressures daily at home and she is agreeable. Medications reviewed and she is taking Amlodipine  5mg  and lopressor .  Provided with a list of foods that will help lower her BP.  BP reviewed and improved at 136/82 -continue clonidine -Advised checking BP daily at home and logging results to bring into follow-up appointment with PCP and myself. -Reviewed BP meds today.  -Advised regarding healthy foods that can help lower blood pressure and provided with a list:  PAIN:  Are you having pain? No  PRECAUTIONS: Fall  RED FLAGS: None   WEIGHT BEARING RESTRICTIONS: No  FALLS: Has patient fallen in last 6 months? Yes. Number of falls 1  LIVING ENVIRONMENT: Lives with: lives with their family and lives in a skilled nursing facility Lives in: Other SNF  Stairs: No Has following equipment at home: Hemi walker and Wheelchair (manual)  PLOF: Requires assistive device for independence, Needs assistance with ADLs, Needs assistance with homemaking, and WC level currently   PATIENT GOALS: move better - walking or transferring with hemi walking   OBJECTIVE:  Note: Objective measures were completed at Evaluation unless otherwise noted.  DIAGNOSTIC FINDINGS:  Head CT:  IMPRESSION: 1. No acute intracranial abnormality. 2. No acute displaced fracture or traumatic listhesis of the cervical spine.  Cervical CT:  MPRESSION: 1. No acute intracranial abnormality. 2. No acute displaced fracture or traumatic  listhesis of the cervical spine.  COGNITION: Overall cognitive status: History of cognitive impairments - at baseline   SENSATION: Light touch: Impaired  Proprioception: Impaired  Able to detect depe pressure   COORDINATION: Increased tone on the RLE   EDEMA:  Mild RLE distal edema   MUSCLE TONE: RLE: Mild and Moderate  MUSCLE LENGTH: Hamstrings:  grossly limited    POSTURE: rounded shoulders, forward head, and posterior bias   LOWER EXTREMITY ROM:     Active  Right Eval Left Eval  Hip flexion  Limited to 5 deg beyond 90 in sitting  Knee flexion  90deg in sitting  Knee extension  Lacking 10 deg full extension  Ankle dorsiflexion  none  Ankle plantarflexion  none   (Blank rows = not tested)  LOWER EXTREMITY MMT:    MMT Right Eval Left Eval  Hip flexion  2+  Hip extension    Hip abduction  2  Hip adduction  3  Hip internal rotation    Hip external rotation    Knee flexion  2+  Knee extension  3+  Ankle dorsiflexion  0  Ankle plantarflexion  0  Ankle inversion    Ankle eversion    (Blank rows = not tested)  BED MOBILITY:  Sit to supine Mod A Supine to sit Mod A Rolling to Right Min A Rolling to Left Min A  TRANSFERS: Assistive device utilized: Hemi walker  Sit to stand: Mod A Stand to sit: Mod A Chair to chair: Mod A Floor:  unable to perform     CURB:  Level of Assistance: Total A Assistive device utilized:  rail Curb Comments: unable to perform   STAIRS: Unable to perform at eval   GAIT: Gait pattern:  non-functional constant posterior bias , step to pattern, decreased stance time- Right, Right hip hike, lateral hip instability, and lateral lean- Left Distance walked: 42ft Assistive device utilized: Hemi walker Level of assistance: Mod A Comments: moderate cues for sequencing of HW and gait pattern in turns   FUNCTIONAL TESTS:  5 times sit to stand: 48 sec with mod Assist from PT  Timed up and go (TUG): unable to perform  10 meter  walk test: 6 ft with min-mod assist and HW.  FIST:  Function In Sitting Test (FIST)  (1/2 femur on surface; hips/knees flexed to 90deg)   - indicate bed or mat table / step stool if used  SCORING KEY: 4 = Independent (completes task independently & successfully) 3 = Verbal Cues/Increased Time (completes task independently & successfully and only needs more time/cues) 2 = Upper Extremity Support (must use UE for support or assistance to complete successfully) 1 = Needs Assistance (unable to complete w/o physical assist; DOCUMENT LEVEL: min, mod, max) 0 = Dependent (requires complete physical assist; unable to complete successfully even w/ physical assist)  Randomly Administer Once Throughout Exam  4 - Anterior Nudge (superior sternum)  4 - Posterior Nudge (between scapular spines)  4 - Lateral Nudge (to dominant side at acromion)     4 - Static sitting (30 seconds)  4 - Sitting, shake 'no' (left and right)  4 - Sitting, eyes closed (30 seconds)   3 - Sitting, lift foot (dominant side, lift foot 1 inch twice)    4 - Pick up object from behind (object at midline, hands breadth posterior)  1 mod assist - Forward reach (use dominant arm, must complete full motion) 1 mod assist - Lateral reach (use dominant arm, clear opposite ischial tuberosity) 1 mod assist  - Pick up object from floor (from between feet)   1 mod assist - Posterior scooting (move backwards 2 inches)  1 min assist  - Anterior scooting (move forward 2 inches)  1 min assist  - Lateral scooting (move to dominant side 2 inches)    TOTAL = 37/56  Notes/comments: apraxia limiting success of scooting and reacting tasks.  MCD > 5 points MCID for IP REHAB > 6 points      PATIENT SURVEYS:  To be completed                                                                                                                               TREATMENT DATE: 01/25/2024    Pt arrives to therapy in wheelchair. Pt wearing her  custom R LE AFO.  Sit<>stand form WC at rail on wall. Standing weight shift R and L with BUE support x 8, pt then reports pain in the R hand and performed additional 5 bil .   At rail on wall  Forward step x10 bil with BUE support on 5 and LUE only x 5.  Stand pivot transfers to arm chair and from [facility] with support rail on the L. Performed x 4 with cues for posture, anterior weight shift, and sequencing.   Lateral stepping at rail x 8 bil with min-mod assist for posture and full step width on BLE.   Gait with HW 2 x 69ft with min-mod assist for posture, weight shift and cues for sequencing in turns. 3 mild posterior LOB requiring assist from PT to prevent fall. Pt hesitant to perform gait training on this day.   Seated BLE knee extension/flexion AAROM/PROM for quad tone management and to allow improved R foot placement underneath BOS prior to initiating transfer training - x10reps. Hip adduction/hip abduction x 12 bil  Hip flexion with AAROM on the LLE x 8 bil. Severely limited ROM on the last bout.   Stand pivot transfer to [facility] with min assist overall, but max cues for sequencing for step pattern and HW with  turn to sit on the R hand side.   Repeated sit<>stands from w/c to L UE support on balance bar x5 reps - focused on carryover of proper motor planning to push up from w/c armrest when going to stand and then reach back for armrest when going to sit - only requires up to heavy min A today with improved anterior trunk lean when rising - repeated cuing to flex trunk forward and flex hips to move hips posteriorly when going to sit (occasionally pt keeps trunk/hips extended with posterior lean when going to sit).  PATIENT EDUCATION: Education details: POC.  Pt educated throughout session about proper posture and technique with exercises. Improved exercise technique, movement at target joints, use of target muscles after min to mod verbal, visual, tactile cues.   Person educated: Patient and  Child(ren) Education method: Explanation Education comprehension: verbalized understanding  HOME EXERCISE PROGRAM: Access Code: [email or web address] URL: [email or web address] Date: 12/05/2023 Prepared by: Aurora Lees  Exercises - Sit to Stand with Counter Support  - 1 x daily - 3-4 x weekly - 2 sets - 10 reps - Seated Long Arc Quad  - 1 x daily - 3-4 x weekly - 2 sets -  10 reps - 2 hold - Seated March  - 1 x daily - 3-4 x weekly - 2 sets - 10 reps - 2 hold - Standing Hip Abduction with Counter Support  - 1 x daily - 7 x weekly - 3 sets - 5 reps - Wide Stance with Counter Support  - 1 x daily - 7 x weekly - 3 sets - 2 reps - 30 hold - Seated Hip Abduction  - 1 x daily - 7 x weekly - 3 sets - 10 reps - Supine Bridge  - 1 x daily - 7 x weekly - 3 sets - 10 reps - Supine Short Arc Quad  - 1 x daily - 7 x weekly - 3 sets - 10 reps - Bent Knee Fallouts  - 1 x daily - 7 x weekly - 3 sets - 10 reps - Small Range Straight Leg Raise  - 1 x daily - 7 x weekly - 3 sets - 10 reps - Supine Heel Slide  - 1 x daily - 7 x weekly - 3 sets - 10 reps   GOALS: Goals reviewed with patient? Yes  SHORT TERM GOALS: Target date: 12/13/2023    Patient will be independent in home exercise program to improve strength/mobility for better functional independence with ADLs. Baseline: to be provided on visit 2: 12/28/2023- No caregiver with patient to determine if compliant with HEP Goal status: INITIAL   LONG TERM GOALS: Target date: 02/07/2024     2.  Patient (> 7 years old) will complete five times sit to stand test by 15 seconds and only min assist indicating an increased LE strength and improved balance. Baseline: 48 with mod assist from PT; 12/28/2023= 44 sec from EOM at lowest postion (approx 21 in) with Left UE support and CGA to min A from PT Goal status: Progressing 3.  Patient will increase FIST score by > 6 points to demonstrate decreased fall risk during functional activities Baseline:  37 Goal status: INITIAL  4.  Patient will complete 10 meter walk test to with HW in less than 1 minute with min assist as to improve gait speed for better community ambulation and to reduce fall risk. Baseline: 6 ft with HW and mod assist; 12/28/2023=18 feet with HW- mod assist  Goal status: PROGRESSING  5.  Patient will perform stand pivot transfer to and from [facility] with HW and CGA for safety to allow safe transfer to toilet with daughter without use of rail on the wall. Baseline: mod assist and heavy use of rail. 12/28/2023= Min to Mod assist with repetitive VC for technique Goal status: Progressing  6.  Patient will improve bed mobility to min assist only to reduce care giver burden and improve safety of transfers  Baseline: mod-max assist Goal status: INITIAL  ASSESSMENT:  CLINICAL IMPRESSION:  Patient presents with good motivation for therapy appointment. Therapy session focused on continuation of  carryover of proper motor planning sit<>stand and stand pivot transfers using L UE support on wheelchair armrest to improve proper trunk movemeny.   Contiued to encourage gait training with reciprocal pattern, but pt slightly resistive to gait training on this day, limiting distance. Daughter was unable to attain photo of restroom in facility since last session, will still continue to focus on safety with transfers and encourage education with family. Ms. Grayer will benefit from continued skilled PT to address strength and balance deficits to improve safety for transfers, reduce care giver burden, and allow return to [facility]  and possible return home from Assisted living.    OBJECTIVE IMPAIRMENTS: Abnormal gait, cardiopulmonary status limiting activity, decreased activity tolerance, decreased balance, decreased cognition, decreased coordination, decreased endurance, decreased knowledge of use of DME, decreased mobility, difficulty walking, decreased ROM, decreased strength, hypomobility, increased muscle  spasms, impaired flexibility, impaired sensation, impaired tone, impaired UE functional use, improper body mechanics, and postural dysfunction.   ACTIVITY LIMITATIONS: carrying, lifting, bending, standing, squatting, stairs, transfers, bed mobility, continence, bathing, toileting, dressing, reach over head, hygiene/grooming, and locomotion level  PARTICIPATION LIMITATIONS: cleaning, interpersonal relationship, shopping, and community activity  PERSONAL FACTORS: 3+ comorbidities: HTN, chronic CVA, Afib, Lupus   are also affecting patient's functional outcome.   REHAB POTENTIAL: Fair chronic CVA with poor progress in prior interventions with this clinic  CLINICAL DECISION MAKING: Unstable/unpredictable  EVALUATION COMPLEXITY: High  PLAN:  PT FREQUENCY: 1-2x/week  PT DURATION: 12 weeks  PLANNED INTERVENTIONS: 97110-Therapeutic exercises, 97530- Therapeutic activity, [id number]- Neuromuscular re-education, 97535- Self Care, 14782- Manual therapy, [phone]- Gait training, [phone]- Orthotic Fit/training, [phone]- Aquatic Therapy, [phone]- Splinting, 97014- Electrical stimulation (unattended), [phone]- Electrical stimulation (manual), Patient/Family education, Balance training, Stair training, Joint mobilization, Vestibular training, Visual/preceptual remediation/compensation, Cognitive remediation, DME instructions, Wheelchair mobility training, Cryotherapy, and Moist heat  PLAN FOR NEXT SESSION:  - address safe transfer technique patient can perform with staff at SNF   - provide education to patient's DTR and/or video technique so she can show SNF staff - gait training with +2 L HHA for increased distance using mirror feedback - stretching of quads for tone management to improve knee flexion ROM to allow proper foot positioning underneath BOS during transfers - stretching hip flexors to improve upright posture in standing -  R glute activation and strength during standing  Continue  standing balance. Gait.  Transfers.    Aurora Lees PT, DPT  Physical Therapist - [facility]  10:08 AM 01/25/24

## 2024-01-30 ENCOUNTER — Ambulatory Visit: Payer: Medicare Other

## 2024-01-30 ENCOUNTER — Ambulatory Visit: Payer: Medicare Other | Admitting: Occupational Therapy

## 2024-01-30 ENCOUNTER — Ambulatory Visit: Payer: Medicare Other | Admitting: Speech Pathology

## 2024-02-01 ENCOUNTER — Ambulatory Visit: Payer: Medicare Other | Admitting: Occupational Therapy

## 2024-02-01 ENCOUNTER — Ambulatory Visit: Payer: Medicare Other | Admitting: Physical Therapy

## 2024-02-01 ENCOUNTER — Ambulatory Visit: Payer: Medicare Other | Attending: Family Medicine | Admitting: Speech Pathology

## 2024-02-01 DIAGNOSIS — R269 Unspecified abnormalities of gait and mobility: Secondary | ICD-10-CM | POA: Insufficient documentation

## 2024-02-01 DIAGNOSIS — I63542 Cerebral infarction due to unspecified occlusion or stenosis of left cerebellar artery: Secondary | ICD-10-CM

## 2024-02-01 DIAGNOSIS — R2689 Other abnormalities of gait and mobility: Secondary | ICD-10-CM

## 2024-02-01 DIAGNOSIS — R278 Other lack of coordination: Secondary | ICD-10-CM | POA: Insufficient documentation

## 2024-02-01 DIAGNOSIS — R4701 Aphasia: Secondary | ICD-10-CM

## 2024-02-01 DIAGNOSIS — R262 Difficulty in walking, not elsewhere classified: Secondary | ICD-10-CM | POA: Insufficient documentation

## 2024-02-01 DIAGNOSIS — R2681 Unsteadiness on feet: Secondary | ICD-10-CM | POA: Insufficient documentation

## 2024-02-01 DIAGNOSIS — M6281 Muscle weakness (generalized): Secondary | ICD-10-CM

## 2024-02-01 DIAGNOSIS — R41841 Cognitive communication deficit: Secondary | ICD-10-CM | POA: Diagnosis present

## 2024-02-01 DIAGNOSIS — R482 Apraxia: Secondary | ICD-10-CM

## 2024-02-01 NOTE — Therapy (Signed)
 OUTPATIENT SPEECH LANGUAGE PATHOLOGY  TREATMENT NOTE   Patient Name: Lindsey [name] MRN: [id number] DOB:December 08, 1955, 68 y.o., female Today's Date: 02/01/2024  PCP: Mat Solian, MD REFERRING PROVIDER: Laverle Postin, MD    End of Session - 02/01/24 0927     Visit Number 14    Number of Visits 24    Date for SLP Re-Evaluation 02/06/24    Authorization Type Medicare Part A/ Medicare Part B    Progress Note Due on Visit 20    SLP Start Time 0800    SLP Stop Time  0845    SLP Time Calculation (min) 45 min    Activity Tolerance Patient tolerated treatment well                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) 09/24/2021   Cerebral embolism with cerebral infarction 09/23/2021   Pyelonephritis 09/19/2021   Pain due to onychomycosis of toenails of both feet 11/19/2020   Normocytic anemia 05/31/2020   Acute ischemic left MCA stroke (HCC) 04/30/2020   Right hemiplegia (HCC) 04/30/2020   Cerebrovascular accident (HCC) 04/21/2020   Atrial fibrillation with RVR (HCC) 04/21/2020   Essential hypertension 04/21/2020   Alcohol abuse 04/21/2020    ONSET DATE: 04/30/2020 initial CVA; 10/25/2020  second CVA;  11/07/2023 date of  referral  REFERRING DIAG: R47.01 (ICD-10-CM) - Aphasia   THERAPY DIAG:  Aphasia  Apraxia  Cognitive communication deficit  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 female with the following prior history:  MRI 04/21/2020 Brain: Acute infarct left MCA territory. Infarct involves the left frontal lobe, insula as well as the caudate and putamen. There are areas of hemorrhage within the caudate and putamen as well as in the left frontal lobe.  Scattered small white matter hyperintensities elsewhere compatible with chronic microvascular ischemia. Ventricle size normal. Negative for mass lesion.  Inpatient Rehabilitation ST Services: 04/2020 thru 05/2020 Outpatient ST services 08/2020 thru 08/2021 Pt discharged with [employer] Device; per chart using device to assist with communication  MRI 09/22/2021 IMPRESSION: 1. Acute moderate size Left AICA cerebellar infarct. Petechial hemorrhage but no malignant hemorrhagic transformation. Cytotoxic edema but no posterior fossa mass effect at this time.  2. Underlying extensive chronic left MCA territory encephalomalacia related to the 2021 infarct. Associated left brainstem Wallerian degeneration since last year. And small superimposed chronic Left PICA cerebellar infarcts, but also new since last year.   Inpatient Rehabilitation ST Services at [facility]: 10/02/2021 thru 10/21/2021 Outpatient ST Services [facility] 10/2021 thru 03/08/2022  Pt discharged d/t reaching maximal rehabilitation potential; "3/4 LTGs particially met due to limited caregiver availability"  Pt is currently residing at a SNF [facility]). Additional medical history includes alcoholism,  AFib, Renal Artery thrombosis, situational depression, HTN, Pyelnephritis, Seizure activity,  Constipation, AKI. Vitamin D  deficiency, and urinary incontinence.   PAIN:  Are you having pain? No  FALLS: Has patient fallen in last 6 months?  See PT evaluation for details  LIVING ENVIRONMENT: Lives with: lives with their family and lives in a skilled nursing facility   PLOF:  Level of assistance: Needed assistance with ADLs, Needed assistance with IADLS   PATIENT GOALS    pt unable to state - her daughter reports she would like for pt to be able to "communicate basics, return to abilities before second stroke (09/2021)"  SUBJECTIVE STATEMENT: Pt eager, accompanied by her daughter Pt accompanied by: self and her daughter  OBJECTIVE:   TODAY'S TREATMENT:     Skilled treatment session focused on her communication goals. SLP facilitated the session by providing the following interventions:  Pt missed session earlier in the week and this writer left her daughter a message regarding ST POC and discharge on 02/06/2024.   This Clinical research associate has Occupational hygienist with [employer] regarding ways to facilitate use of SGD in pt's SNF by reducing the risk of it being taken. In addition, pt's daughter has the following questions - could pt have another updated device to use at home when visiting her daughter and then the previous device could be used at the facility? Is there a way to attach device to pt's wheelchair to reduce risk of it being stolen that is not permanent?   Talk[employer] Therapy -  Re-created account with her daughter's information to promote increased ability to log-in Word Repetition  Level 1 Describe the Picture Level 1 Word ID - Listening - Level 1 Word ID - Reading - Level 1 Matching - Level 1 Matching - Level 2 Overall pt required maximal faded to moderate assistance for physical use of finger to touch screen appropriately to activate selection - suspect d/t decreased practice  Continued skilled information provided to pt (and her daughter) on need to participate in  activities that promote language use within her facility. At this point in pt's recovery, pt would benefit from continued exposure to language vs residing alone in her room. Pt voiced understanding.    To promote more language use, daughter purchased cell phone with some numbers programmed into it. Names for those numbers are harder for pt to read, therefore daughter reports that she will put faces on her cell phone with the numbers that are stored so pt can call people   PATIENT EDUCATION: Education details: use of low tech communication  Person educated: Patient and Child(ren) Education method: Explanation Education comprehension: needs further education  HOME EXERCISE PROGRAM:   Talk[employer] Therapy App Increase her involvement in activities provided by facility   GOALS:  Goals reviewed with patient? Yes  SHORT TERM GOALS: Target date: 10 sessions  Given verbal or visual prompt, pt will correctly ID written word/object/icon in field of 4-6 to communicate wants/needs with 75% accuracy given occasional mod A over 2 sessions  Baseline: Goal status: INITIAL: progress made, staff is reportedly using at nursing home   LONG TERM GOALS: Target date: 02/06/2024  Pt will utilize multimodal communication to express basic wants/needs as reported by pt and her daughter in 3 out of 5 opportunities.  Baseline:  Goal status: INITIAL: progress  made   ASSESSMENT:  CLINICAL IMPRESSION: Patient is a 68 y.o. right handed female who was seen today for a speech language treatment d/t history of Left MCA CVA. 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 Her verbal language Is halting and while she does produce some islands of improved speech intelligibility during naming tasks and spontaneous responses, she speech contains neologisms with evidence of word finding difficulty. Pt's daughter reports that pt  doesn't use her [employer] device "as much as she should" and it is only available to her during weekends at home for fear that it might be lost at pt's facility.   See the above treatment note for details. Last treatment session planned for Feb 06, 2024.   OBJECTIVE IMPAIRMENTS include attention, aphasia, apraxia, and dysarthria. These impairments are limiting patient from effectively communicating at home and in community. Factors affecting potential to achieve goals and functional outcome are ability to learn/carryover information, co-morbidities, cooperation/participation level, medical prognosis, previous level of function, severity of impairments, family/community support, and time post onset . Patient will benefit from skilled SLP services to address above impairments and improve overall function.  REHAB POTENTIAL: Fair time post onset, multiple courses of ST services, decreased carryover of therapy concepts during previous sessions; pt discharged from previous course of ST services (2023) requiring moderate assistance for communication  PLAN: SLP FREQUENCY: 1-2x/week  SLP DURATION: 8 weeks  PLANNED INTERVENTIONS: Language facilitation, Internal/external aids, Functional tasks, Multimodal communication approach, SLP instruction and feedback, Compensatory strategies, Patient/family education, and 14782 Treatment of speech (30 or 45 min)     Carmencita Cusic B. Garlin Junker, M.S., CCC-SLP, Tree surgeon Certified Brain Injury Specialist [facility]  [facility] Rehabilitation Services Office [phone] Ascom [phone] Fax [phone]

## 2024-02-01 NOTE — Therapy (Addendum)
 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) 09/24/2021   Cerebral embolism with cerebral infarction 09/23/2021   Pyelonephritis 09/19/2021   Pain due to onychomycosis of toenails of both feet 11/19/2020   Normocytic anemia 05/31/2020   Acute ischemic left MCA stroke (HCC) 04/30/2020   Right hemiplegia (HCC) 04/30/2020   Cerebrovascular accident (HCC) 04/21/2020   Atrial fibrillation with RVR (HCC) 04/21/2020   Essential hypertension 04/21/2020   Alcohol abuse 04/21/2020   ONSET DATE: 09/19/21  REFERRING DIAG: CVA  THERAPY DIAG:  Muscle weakness (generalized)  Rationale for  Evaluation and Treatment: Rehabilitation  SUBJECTIVE:  SUBJECTIVE STATEMENT: Pt. reports  that she has to get better. Pt Pt accompanied by: self, daughter  PERTINENT HISTORY: Pt. is a 68 y.o. 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 Pt. has Right sided weakness, and receptive, and expressive aphasia. Pt. has had a previous CVA with right sided weakness in July 2021. PMHx includes: AFib, Renal Artery thrombosis, situational depression, HTN, Pyelnephritis, Seizure activity, COnstipation, AKI. Vitamin D  deficiency, and urinary incontinence   PRECAUTIONS: None  WEIGHT BEARING RESTRICTIONS: No  PAIN:  Are you having pain? 4/10 FACES pain in the right shoulder   FALLS: Has patient fallen in last 6 months? Yes. Number of falls 1-ER visit  LIVING ENVIRONMENT: Pt. resides at [facility]. Pt. has very supportive family Stairs: One level Has following equipment at home:  Equipment provided through [employer]  PLOF: Needs assistance with ADLs  PATIENT GOALS:  To improve ROM/flexibility in the RUE 2/2 increased tightness, and to improve ADL functioning in order to towards returning home.  OBJECTIVE:  Note: Objective measures were completed at Evaluation unless otherwise noted.  HAND DOMINANCE: Right  ADLs: Overall ADLs: Assist is provided from the staff at [facility]. Transfers/ambulation related to ADLs: Eating: independent, with set-up using the left hand Grooming: Independent with set-up using the left hand UB Dressing: Increased assist required, Mod-MaxA  doffing jacket LB Dressing: Increased assist required Toileting:  Staff Assists with toileting transfers, and toilet hygiene care. Bathing: Staff assists with bathing Tub Shower transfers: Requires staff assist Equipment: TBD  IADLs: Shopping: N/A Light housekeeping: Provided by staff Meal Prep: 3 Meals a day provided. Pt. Eats in the main  dining room when family visits, otherwise Pt. Eats in her room.  Community mobility: Relies on family for transportation to therapy. Per daughter- Transportation is provided for services not offered at the facility. Medication management: Provided by the facility Financial management: Daughter manages Handwriting: TBD  MOBILITY STATUS: Hx of falls  FUNCTIONAL OUTCOME MEASURES:  UPPER EXTREMITY ROM:    Passive ROM Left Eval AROM WFL Right Eval  Right 01/02/24  Shoulder flexion  0(46) 25 (90)  Shoulder abduction  0(64) 0 (90)  Shoulder adduction     Shoulder extension     Shoulder internal rotation     Shoulder external rotation   (25)  Elbow flexion  0(98) 0(131)  Elbow extension  0(full passive) 0 (full passive)  Wrist flexion  0(56) 0(90)  Wrist extension  -56(30) 0 (40)  Wrist ulnar deviation     Wrist radial deviation     Wrist pronation     Wrist supination     (Blank rows = not tested)  UPPER EXTREMITY MMT:     MMT Left Eval 5/5 Right eval Right  01/02/24  Shoulder flexion  0/5 2/5   Shoulder abduction  0/5 1/5  Shoulder adduction   2-/5  Shoulder extension     Shoulder internal rotation   1/5  Shoulder external rotation     Middle trapezius     Lower trapezius     Elbow flexion  0/5 0/5  Elbow extension  0/5 2/5  Wrist flexion   1/5  Wrist extension  0/5 3-/5  Wrist ulnar deviation     Wrist radial deviation     Wrist pronation  0/5 0/5  Wrist supination  0/5 0/5  Digit flexors   2-/5  Digit extensors   0/5  (Blank rows = not tested)  HAND FUNCTION: N/A  COORDINATION: N/A  SENSATION: Diminished  EDEMA: No edema noted in the right hand today  MUSCLE TONE: impaired  COGNITION: Overall cognitive status: Aphasia  VISION: Wears glasses, no change from baseline  PERCEPTION: TBD  PRAXIS: Impaired: Motor planning                                                                                                                            TREATMENT DATE: 02/01/24    Manual therapy:   -Soft tissue massage to the scapular, pectoral, and right shoulder musculature. -Soft tissue mobilizations to promote radius on ulna motion needed for supination.  -Manual therapy was performed independent of, and in preparation for therapeutic Ex.  Therapeutic Exercise:   -PROM through the following joint ranges of the RUE, including: shoulder flexion, abduction, ER, horizontal abduction, elbow flexion,  extension, forearm supination, wrist extension, digit extension, and thumb abduction, working to increase RUE mobility to manage hemiparetic limb during basic ADLs, as well as reducing contracture risk. Janine Melbourne for R shoulder flex/ext, horizontal  abduction/adduction. -Cues, and hand over hand assist were required for self-ROM HEP for the UE.  Therapeutic Activities:  -Facilitated diagonal patterns of movement in the RUE with hand-over-hand assist in preparation for self-grooming.   PATIENT EDUCATION: Education details: RUE PROM/AAROM, Pt./caregiver education about possible restorative ROM programs at SNF Person educated: Patient Education method: Explanation, Demonstration, Tactile cues, and Verbal cues Education comprehension: verbal cues required, tactile cues required, and needs further education  HOME EXERCISE PROGRAM: RUE self PROM  GOALS: Goals reviewed with patient? Yes  SHORT TERM GOALS: Target date: 01/16/2024    Pt. Will demonstrate supervision with HEPs for BUEs Baseline: Eval: No current HEPs; 01/02/24: Pt demos self PROM techniques throughout the RUE with intermittent min A and mod vc for accuracy Goal status: in progress  LONG TERM GOALS: Target date: 02/07/2024    1.  Pt. Will improve right shoulder flexion to 45* to assist with UE dressing Baseline: Eval: Right: 0(46); 01/02/24: Right: 25 (90) Goal status: revised on 01/02/24 d/t 20* increase met/ in progress  2.  Pt. Will improve with shoulder abduction by 20 degrees  to assist with underarm care, and hygiene Baseline: Eval: Right 0(64); 01/02/24: Right 0 (90) Goal status: achieved  3.  Pt. Will improve right elbow flexion by 10 degrees to assist with hand to face patterns for grooming Baseline: Eval: Right: 0(98); 01/02/24: Right: 0(131) Goal status: in progress  4.  Pt. Will elicit active wrist extension, and digit extension in preparation for rubbing lotion into the left forearm, and hand  Baseline: Eval: Right wrist: -56 (30); 01/02/24: R wrist: ext: actively to neutral/0 (passively to 40*); R digit extension: no active ext (passively [facility] for this task); unable to self apply lotion to L forearm Goal status: in progress  5.  Pt. Will perform UE dressing skills with MinA Baseline: Eval: Mod-MaxA doffing a jacket; 01/02/24: Mod A to don/doff a jacket Goal status: in progress  6. Pt. Will require minA toileting transfers/ clothing management Baseline: Eval: Pt. Requires increased assist from staff; 01/02/24: pt can transfer to standard height toilet with grab bar for support with min A, but requires mod A to transfer up from toilet; Pt consistently performs wc<>BSC transfer with min A, extra time, and vc for sequencing while using hemi walker; total A for clothing management.  Goal status: in progress  ASSESSMENT:  CLINICAL IMPRESSION:  Pt.'s daughter reports that she is planning to talk to the Pt.'s SNF about restorative/ROM programs available for the Pt. to receive daily ROM to the RUE. Pt. is improving with RUE ROM, and is able to tolerate increased ROM. Pt. needs daily ROM to the RUE 2/2 tightness, and requires assist to perform it thoroughly for herself. Pt. continues to present with increased flexor tone, with significant tightness proximally through the right scapular and pectoral musculature, as well as shoulder musculature, wrist, and digit flexion. Pt. continues to present with strong extensor tone in the right elbow limiting elbow flexion. Pt. continues  to benefit from OT services to normalize tone in the dominant RUE, improve ROM for underarm hygiene care and UE dressing, as well as to improve toileting care, and clothing negotiation tasks.     PERFORMANCE DEFICITS: in functional skills including ADLs, IADLs, coordination, dexterity, proprioception, sensation, ROM, strength, pain, Fine  motor control, Gross motor control, balance, body mechanics, decreased knowledge of use of DME, and UE functional use, cognitive skills including problem solving, and psychosocial skills including coping strategies, environmental adaptation, and routines and behaviors.   IMPAIRMENTS: are limiting patient from ADLs, IADLs, and leisure.   CO-MORBIDITIES: may have co-morbidities  that affects occupational performance. Patient will benefit from skilled OT to address above impairments and improve overall function.  MODIFICATION OR ASSISTANCE TO COMPLETE EVALUATION: Min-Moderate modification of tasks or assist with assess necessary to complete an evaluation.  OT OCCUPATIONAL PROFILE AND HISTORY: Detailed assessment: Review of records and additional review of physical, cognitive, psychosocial history related to current functional performance.  CLINICAL DECISION MAKING: Moderate - several treatment options, min-mod task modification necessary  REHAB POTENTIAL: Good  EVALUATION COMPLEXITY: Moderate    PLAN:  OT FREQUENCY: 2x/week  OT DURATION: 12 weeks  PLANNED INTERVENTIONS: 97168 OT Re-evaluation, 97535 self care/ADL training, 16109 therapeutic exercise, 97530 therapeutic activity, 97112 neuromuscular re-education, 97140 manual therapy, 97018 paraffin, 60454 moist heat, 97010 cryotherapy, 97034 contrast bath, passive range of motion, coping strategies training, patient/family education, and DME and/or AE instructions  RECOMMENDED OTHER SERVICES: PT, ST  CONSULTED AND AGREED WITH PLAN OF CARE: Patient  PLAN FOR NEXT SESSION: see above  Duey Ghent,  MS, OTR/L   02/01/2024, 12:15 PM

## 2024-02-01 NOTE — Therapy (Signed)
 OUTPATIENT PHYSICAL THERAPY NEURO TREATMENT  Patient Name: Lindsey [name] MRN: [id number] DOB:09/02/1956, 68 y.o., female Today's Date: 02/01/2024   PCP: Lorina Roosevelt, MD  REFERRING PROVIDER: Liam Redhead, MD   END OF SESSION:   PT End of Session - 02/01/24 0859     Visit Number 17    Number of Visits 24    Date for PT Re-Evaluation 02/06/24    Progress Note Due on Visit 20    PT Start Time 0859    PT Stop Time 0930    PT Time Calculation (min) 31 min    Equipment Utilized During Treatment Gait belt   custom R LE AFO   Activity Tolerance Patient tolerated treatment well    Behavior During Therapy WFL for tasks assessed/performed                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   REFERRING DIAG:  Diagnosis  I69.398,R25.2 (ICD-10-CM) - Spasticity as late effect of cerebrovascular accident (CVA)    THERAPY DIAG:  Muscle weakness (generalized)  Apraxia  Other lack of coordination  Unsteadiness on feet  Abnormality of gait and mobility  Difficulty in walking, not elsewhere classified  Other abnormalities of gait and mobility  Rationale for Evaluation and Treatment: Rehabilitation  SUBJECTIVE:  SUBJECTIVE STATEMENT:  Pt with aphasia, responding primarily to yes/no questions. Arrives late to PT treatment with daughter due to need for restroom break following SLP treatment. No new complaints.    From EVAL: Daughter present for Evaluation to provide hx. Pt is familiar to this clinic and has been seen for multiple bouts PT since initial CVA in 2021. Pt d/c'ed from PT serviced in April of 2024 with plans to receive PT services through [facility]. Daughter reports that she was only receiving minimal therapy in facility. Reports at least 1 fall in the last 6 months and daughter asked staff to transfer pt with lift since fall.  Daughter would want pt to demonstrate improve safety with transfers to allow bil transfers with reduced use of rail to allow pt to return home for assisted living.  Unable to discern whether pt in SNF or assisted living care from hx.    Pt accompanied by: self and family member   PERTINENT HISTORY:  1. Acute Ischemic Left MCA Stroke:Right Hemiplegia: Ordered outpatient OT, PT, and SLP. She has had a HFU appointment with Neurology. Discussed that I can provide new scripts next month if needed. Communicated with her providers regarding positive reinforcement. She has made excellent gains in strength as well as cognition and  speech!   -Provided with new disability placard.    2. Essential Hypertension: BP is currently very elevated and discussed with patient that it would be unsafe for her to receive injections today as the injections can temporarily increase her pain, which can increase her blood pressure, and put her at risk for stroke. Discussed starting the medication Valsartan  40mg  daily and logging blood pressures daily at home and she is agreeable. Medications reviewed and she is taking Amlodipine  5mg  and lopressor .  Provided with a list of foods that will help lower her BP.  BP reviewed and improved at 136/82 -continue clonidine -Advised checking BP daily at home and logging results to bring into follow-up appointment with PCP and myself. -Reviewed BP meds today.  -Advised regarding healthy foods that can help lower blood pressure and provided with a list:  PAIN:  Are you having pain? No  PRECAUTIONS: Fall  RED FLAGS: None   WEIGHT BEARING RESTRICTIONS: No  FALLS: Has patient fallen in last 6 months? Yes. Number of falls 1  LIVING ENVIRONMENT: Lives with: lives with their family and lives in a skilled nursing facility Lives in: Other SNF  Stairs: No Has following equipment at home: Hemi walker and Wheelchair (manual)  PLOF: Requires assistive device for independence, Needs assistance with ADLs, Needs assistance with homemaking, and WC level currently   PATIENT GOALS: move better - walking or transferring with hemi walking   OBJECTIVE:  Note: Objective measures were completed at Evaluation unless otherwise noted.  DIAGNOSTIC FINDINGS:  Head CT:  IMPRESSION: 1. No acute intracranial abnormality. 2. No acute displaced fracture or traumatic listhesis of the cervical spine.  Cervical CT:  MPRESSION: 1. No acute intracranial abnormality. 2. No acute displaced fracture or traumatic listhesis of the cervical spine.  COGNITION: Overall cognitive status: History of cognitive impairments -  at baseline   SENSATION: Light touch: Impaired  Proprioception: Impaired  Able to detect depe pressure   COORDINATION: Increased tone on the RLE   EDEMA:  Mild RLE distal edema   MUSCLE TONE: RLE: Mild and Moderate  MUSCLE LENGTH: Hamstrings: grossly limited    POSTURE: rounded shoulders, forward head, and posterior bias   LOWER EXTREMITY ROM:  Active  Right Eval Left Eval  Hip flexion  Limited to 5 deg beyond 90 in sitting  Knee flexion  90deg in sitting  Knee extension  Lacking 10 deg full extension  Ankle dorsiflexion  none  Ankle plantarflexion  none   (Blank rows = not tested)  LOWER EXTREMITY MMT:    MMT Right Eval Left Eval  Hip flexion  2+  Hip extension    Hip abduction  2  Hip adduction  3  Hip internal rotation    Hip external rotation    Knee flexion  2+  Knee extension  3+  Ankle dorsiflexion  0  Ankle plantarflexion  0  Ankle inversion    Ankle eversion    (Blank rows = not tested)  BED MOBILITY:  Sit to supine Mod A Supine to sit Mod A Rolling to Right Min A Rolling to Left Min A  TRANSFERS: Assistive device utilized: Hemi walker  Sit to stand: Mod A Stand to sit: Mod A Chair to chair: Mod A Floor:  unable to perform     CURB:  Level of Assistance: Total A Assistive device utilized:  rail Curb Comments: unable to perform   STAIRS: Unable to perform at eval   GAIT: Gait pattern:  non-functional constant posterior bias , step to pattern, decreased stance time- Right, Right hip hike, lateral hip instability, and lateral lean- Left Distance walked: 24ft Assistive device utilized: Hemi walker Level of assistance: Mod A Comments: moderate cues for sequencing of HW and gait pattern in turns   FUNCTIONAL TESTS:  5 times sit to stand: 48 sec with mod Assist from PT  Timed up and go (TUG): unable to perform  10 meter walk test: 6 ft with min-mod assist and HW.  FIST:  Function In Sitting Test (FIST)  (1/2 femur on surface;  hips/knees flexed to 90deg)   - indicate bed or mat table / step stool if used  SCORING KEY: 4 = Independent (completes task independently & successfully) 3 = Verbal Cues/Increased Time (completes task independently & successfully and only needs more time/cues) 2 = Upper Extremity Support (must use UE for support or assistance to complete successfully) 1 = Needs Assistance (unable to complete w/o physical assist; DOCUMENT LEVEL: min, mod, max) 0 = Dependent (requires complete physical assist; unable to complete successfully even w/ physical assist)  Randomly Administer Once Throughout Exam  4 - Anterior Nudge (superior sternum)  4 - Posterior Nudge (between scapular spines)  4 - Lateral Nudge (to dominant side at acromion)     4 - Static sitting (30 seconds)  4 - Sitting, shake 'no' (left and right)  4 - Sitting, eyes closed (30 seconds)   3 - Sitting, lift foot (dominant side, lift foot 1 inch twice)    4 - Pick up object from behind (object at midline, hands breadth posterior)  1 mod assist - Forward reach (use dominant arm, must complete full motion) 1 mod assist - Lateral reach (use dominant arm, clear opposite ischial tuberosity) 1 mod assist  - Pick up object from floor (from between feet)   1 mod assist - Posterior scooting (move backwards 2 inches)  1 min assist  - Anterior scooting (move forward 2 inches)  1 min assist  - Lateral scooting (move to dominant side 2 inches)    TOTAL = 37/56  Notes/comments: apraxia limiting success of scooting and reacting tasks.  MCD > 5 points MCID for IP REHAB > 6 points  PATIENT SURVEYS:  To be completed                                                                                                                               TREATMENT DATE: 02/01/2024    Pt arrives to therapy in wheelchair. Pt wearing her custom R LE AFO.  Standing at rail on wall Static stance 2 x 3 sec with 1 UE support  Moving 10 magnets from R  to L and back with seated rest break between bouts with no UE support  Moving magnet from over head to chest level x 10 each direction with no UE support no rest break between bouts. Difficulty with posterior LOB while moving magnets up and down Wide semitandem stance 3 x 10 sec bil with LUE support Reciprocal stepping x 8 bil with 1 UE Support  Side stepping 66ft x 3 bil with LUE support.  Sit<>stand x 6 with push from WC arm rest. Max cues for sequencing to prevent reaching for rail until in standing position .  Reciprocal march with LUE support. Very limited knee flexion noted on the  RLE. Cues for posture and hip extension on the R to increase step height on the LLE.   Pt transported to OT gym at end of PT treatment. Left sitting in WC.   Throughout all standing tasks, min assist on this day for weight shift to the L with RLE advancement. and to prevent posterior LOB intermittently. to the L to advance the RLE and   Sit<>stand form WC at rail on wall. Standing weight shift R and L with BUE support x 8, pt then reports pain in the R hand and performed additional 5 bil .   PATIENT EDUCATION: Education details: POC.  Pt educated throughout session about proper posture and technique with exercises. Improved exercise technique, movement at target joints, use of target muscles after min to mod verbal, visual, tactile cues.   Person educated: Patient and Child(ren) Education method: Explanation Education comprehension: verbalized understanding  HOME EXERCISE PROGRAM: Access Code: [email or web address] URL: [email or web address] Date: 12/05/2023 Prepared by: Aurora Lees  Exercises - Sit to Stand with Counter Support  - 1 x daily - 3-4 x weekly - 2 sets - 10 reps - Seated Long Arc Quad  - 1 x daily - 3-4 x weekly - 2 sets - 10 reps - 2 hold - Seated March  - 1 x daily - 3-4 x weekly - 2 sets - 10 reps - 2 hold - Standing Hip Abduction with Counter Support  - 1 x daily - 7 x weekly - 3  sets - 5 reps - Wide Stance with Counter Support  - 1 x daily - 7 x weekly - 3 sets - 2 reps - 30 hold - Seated Hip Abduction  - 1 x daily - 7 x weekly - 3 sets - 10 reps - Supine Bridge  - 1 x daily - 7  x weekly - 3 sets - 10 reps - Supine Short Arc Quad  - 1 x daily - 7 x weekly - 3 sets - 10 reps - Bent Knee Fallouts  - 1 x daily - 7 x weekly - 3 sets - 10 reps - Small Range Straight Leg Raise  - 1 x daily - 7 x weekly - 3 sets - 10 reps - Supine Heel Slide  - 1 x daily - 7 x weekly - 3 sets - 10 reps   GOALS: Goals reviewed with patient? Yes  SHORT TERM GOALS: Target date: 12/13/2023    Patient will be independent in home exercise program to improve strength/mobility for better functional independence with ADLs. Baseline: to be provided on visit 2: 12/28/2023- No caregiver with patient to determine if compliant with HEP Goal status: INITIAL   LONG TERM GOALS: Target date: 02/07/2024     2.  Patient (> 43 years old) will complete five times sit to stand test by 15 seconds and only min assist indicating an increased LE strength and improved balance. Baseline: 48 with mod assist from PT; 12/28/2023= 44 sec from EOM at lowest postion (approx 21 in) with Left UE support and CGA to min A from PT Goal status: Progressing 3.  Patient will increase FIST score by > 6 points to demonstrate decreased fall risk during functional activities Baseline: 37 Goal status: INITIAL  4.  Patient will complete 10 meter walk test to with HW in less than 1 minute with min assist as to improve gait speed for better community ambulation and to reduce fall risk. Baseline: 6 ft with HW and mod assist; 12/28/2023=18 feet with HW- mod assist  Goal status: PROGRESSING  5.  Patient will perform stand pivot transfer to and from [facility] with HW and CGA for safety to allow safe transfer to toilet with daughter without use of rail on the wall. Baseline: mod assist and heavy use of rail. 12/28/2023= Min to Mod assist with  repetitive VC for technique Goal status: Progressing  6.  Patient will improve bed mobility to min assist only to reduce care giver burden and improve safety of transfers  Baseline: mod-max assist Goal status: INITIAL  ASSESSMENT:  CLINICAL IMPRESSION:  Patient presents with good motivation for therapy appointment, but late due to toilet needs with daughter. PT treatment focused on standing balance and tolerance at rail on wall, as well as improved sequencing with sit<>stand with increased use of rail and reduced use of railing to pull to stand. Ms. Lores will benefit from continued skilled PT to address strength and balance deficits to improve safety for transfers, reduce care giver burden, and allow return to PLOF and possible return home from Assisted living.    OBJECTIVE IMPAIRMENTS: Abnormal gait, cardiopulmonary status limiting activity, decreased activity tolerance, decreased balance, decreased cognition, decreased coordination, decreased endurance, decreased knowledge of use of DME, decreased mobility, difficulty walking, decreased ROM, decreased strength, hypomobility, increased muscle spasms, impaired flexibility, impaired sensation, impaired tone, impaired UE functional use, improper body mechanics, and postural dysfunction.   ACTIVITY LIMITATIONS: carrying, lifting, bending, standing, squatting, stairs, transfers, bed mobility, continence, bathing, toileting, dressing, reach over head, hygiene/grooming, and locomotion level  PARTICIPATION LIMITATIONS: cleaning, interpersonal relationship, shopping, and community activity  PERSONAL FACTORS: 3+ comorbidities: HTN, chronic CVA, Afib, Lupus   are also affecting patient's functional outcome.   REHAB POTENTIAL: Fair chronic CVA with poor progress in prior interventions with this clinic  CLINICAL DECISION MAKING: Unstable/unpredictable  EVALUATION  COMPLEXITY: High  PLAN:  PT FREQUENCY: 1-2x/week  PT DURATION: 12 weeks  PLANNED  INTERVENTIONS: 97110-Therapeutic exercises, 97530- Therapeutic activity, [id number]- Neuromuscular re-education, 97535- Self Care, 16109- Manual therapy, [phone]- Gait training, [phone]- Orthotic Fit/training, [phone]- Aquatic Therapy, [phone]- Splinting, 97014- Electrical stimulation (unattended), [phone]- Electrical stimulation (manual), Patient/Family education, Balance training, Stair training, Joint mobilization, Vestibular training, Visual/preceptual remediation/compensation, Cognitive remediation, DME instructions, Wheelchair mobility training, Cryotherapy, and Moist heat  PLAN FOR NEXT SESSION:  - address safe transfer technique patient can perform with staff at SNF and provide education to patient's family - stretching of quads for tone management to improve knee flexion ROM to allow proper foot positioning underneath BOS during transfers - stretching hip flexors to improve upright posture in standing -  R glute activation and strength during standing  Continue  standing balance. Gait. Transfers.    Aurora Lees PT, DPT  Physical Therapist - [place]  [facility]  9:00 AM 02/01/24

## 2024-02-06 ENCOUNTER — Ambulatory Visit: Payer: Medicare Other | Admitting: Occupational Therapy

## 2024-02-06 ENCOUNTER — Ambulatory Visit: Payer: Medicare Other | Admitting: Physical Therapy

## 2024-02-06 ENCOUNTER — Ambulatory Visit: Payer: Medicare Other | Admitting: Speech Pathology

## 2024-02-06 ENCOUNTER — Encounter: Payer: Self-pay | Admitting: Physical Medicine and Rehabilitation

## 2024-02-06 ENCOUNTER — Encounter
Payer: Medicare Other | Attending: Physical Medicine and Rehabilitation | Admitting: Physical Medicine and Rehabilitation

## 2024-02-06 VITALS — BP 162/81 | HR 63 | Ht 67.0 in

## 2024-02-06 DIAGNOSIS — I69351 Hemiplegia and hemiparesis following cerebral infarction affecting right dominant side: Secondary | ICD-10-CM | POA: Diagnosis not present

## 2024-02-06 DIAGNOSIS — R4701 Aphasia: Secondary | ICD-10-CM

## 2024-02-06 DIAGNOSIS — R482 Apraxia: Secondary | ICD-10-CM

## 2024-02-06 DIAGNOSIS — G8191 Hemiplegia, unspecified affecting right dominant side: Secondary | ICD-10-CM | POA: Diagnosis not present

## 2024-02-06 DIAGNOSIS — R269 Unspecified abnormalities of gait and mobility: Secondary | ICD-10-CM

## 2024-02-06 DIAGNOSIS — R2681 Unsteadiness on feet: Secondary | ICD-10-CM

## 2024-02-06 DIAGNOSIS — R278 Other lack of coordination: Secondary | ICD-10-CM

## 2024-02-06 DIAGNOSIS — R262 Difficulty in walking, not elsewhere classified: Secondary | ICD-10-CM

## 2024-02-06 DIAGNOSIS — M6281 Muscle weakness (generalized): Secondary | ICD-10-CM

## 2024-02-06 DIAGNOSIS — R2689 Other abnormalities of gait and mobility: Secondary | ICD-10-CM

## 2024-02-06 MED ORDER — ONABOTULINUMTOXINA 100 UNITS IJ SOLR
300.0000 [IU] | Freq: Once | INTRAMUSCULAR | Status: AC
  Administered 2024-02-06: 300 [IU] via INTRAMUSCULAR

## 2024-02-06 NOTE — Therapy (Signed)
 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) 09/24/2021   Cerebral embolism with cerebral infarction 09/23/2021   Pyelonephritis 09/19/2021   Pain due to onychomycosis of toenails of both feet 11/19/2020   Normocytic anemia 05/31/2020   Acute ischemic left MCA stroke (HCC) 04/30/2020   Right hemiplegia (HCC) 04/30/2020   Cerebrovascular accident (HCC) 04/21/2020   Atrial fibrillation with RVR (HCC) 04/21/2020   Essential hypertension 04/21/2020   Alcohol abuse 04/21/2020   ONSET DATE: 09/19/21  REFERRING DIAG: CVA  THERAPY DIAG:  Muscle weakness (generalized)  Rationale for  Evaluation and Treatment: Rehabilitation  SUBJECTIVE:  SUBJECTIVE STATEMENT: Pt. reports  that she has to get better. Pt Pt accompanied by: self, daughter  PERTINENT HISTORY: Pt. is a 68 y.o. 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 Pt. has Right sided weakness, and receptive, and expressive aphasia. Pt. has had a previous CVA with right sided weakness in July 2021. PMHx includes: AFib, Renal Artery thrombosis, situational depression, HTN, Pyelnephritis, Seizure activity, COnstipation, AKI. Vitamin D  deficiency, and urinary incontinence   PRECAUTIONS: None  WEIGHT BEARING RESTRICTIONS: No  PAIN:  Are you having pain? 4/10 FACES pain in the right shoulder   FALLS: Has patient fallen in last 6 months? Yes. Number of falls 1-ER visit  LIVING ENVIRONMENT: Pt. resides at [facility]. Pt. has very supportive family Stairs: One level Has following equipment at home:  Equipment provided through [employer]  PLOF: Needs assistance with ADLs  PATIENT GOALS:  To improve ROM/flexibility in the RUE 2/2 increased tightness, and to improve ADL functioning in order to towards returning home.  OBJECTIVE:  Note: Objective measures were completed at Evaluation unless otherwise noted.  HAND DOMINANCE: Right  ADLs: Overall ADLs: Assist is provided from the staff at [facility]. Transfers/ambulation related to ADLs: Eating: independent, with set-up using the left hand Grooming: Independent with set-up using the left hand UB Dressing: Increased assist required, Mod-MaxA  doffing jacket LB Dressing: Increased assist required Toileting:  Staff Assists with toileting transfers, and toilet hygiene care. Bathing: Staff assists with bathing Tub Shower transfers: Requires staff assist Equipment: TBD  IADLs: Shopping: N/A Light housekeeping: Provided by staff Meal Prep: 3 Meals a day provided. Pt. Eats in the main  dining room when family visits, otherwise Pt. Eats in her room.  Community mobility: Relies on family for transportation to therapy. Per daughter- Transportation is provided for services not offered at the facility. Medication management: Provided by the facility Financial management: Daughter manages Handwriting: TBD  MOBILITY STATUS: Hx of falls  FUNCTIONAL OUTCOME MEASURES:  UPPER EXTREMITY ROM:    Passive ROM Left Eval AROM WFL Right Eval  Right 01/02/24  Shoulder flexion  0(46) 25 (90)  Shoulder abduction  0(64) 0 (90)  Shoulder adduction     Shoulder extension     Shoulder internal rotation     Shoulder external rotation   (25)  Elbow flexion  0(98) 0(131)  Elbow extension  0(full passive) 0 (full passive)  Wrist flexion  0(56) 0(90)  Wrist extension  -56(30) 0 (40)  Wrist ulnar deviation     Wrist radial deviation     Wrist pronation     Wrist supination     (Blank rows = not tested)  UPPER EXTREMITY MMT:     MMT Left Eval 5/5 Right eval Right  01/02/24  Shoulder flexion  0/5 2/5   Shoulder abduction  0/5 1/5  Shoulder adduction   2-/5  Shoulder extension     Shoulder internal rotation   1/5  Shoulder external rotation     Middle trapezius     Lower trapezius     Elbow flexion  0/5 0/5  Elbow extension  0/5 2/5  Wrist flexion   1/5  Wrist extension  0/5 3-/5  Wrist ulnar deviation     Wrist radial deviation     Wrist pronation  0/5 0/5  Wrist supination  0/5 0/5  Digit flexors   2-/5  Digit extensors   0/5  (Blank rows = not tested)  HAND FUNCTION: N/A  COORDINATION: N/A  SENSATION: Diminished  EDEMA: No edema noted in the right hand today  MUSCLE TONE: impaired  COGNITION: Overall cognitive status: Aphasia  VISION: Wears glasses, no change from baseline  PERCEPTION: TBD  PRAXIS: Impaired: Motor planning                                                                                                                            TREATMENT DATE: 02/06/24    Manual therapy:   -Soft tissue massage to the scapular, pectoral, and right shoulder musculature. -Soft tissue mobilizations to promote radius on ulna motion needed for supination.  -Manual therapy was performed independent of, and in preparation for therapeutic Ex.  Therapeutic Exercise:   -PROM through the following joint ranges of the RUE, including: shoulder flexion, abduction, ER, horizontal abduction, elbow flexion,  extension, forearm supination, wrist extension, digit extension, and thumb abduction, working to increase RUE mobility to manage hemiparetic limb during basic ADLs, as well as reducing contracture risk. Janine Melbourne for R shoulder flex/ext, horizontal  abduction/adduction. -Cues, and hand over hand assist were required for self-ROM HEP for the UE.  Self-care:  -Worked on donning, and doffing a zip-up jacket using one armed dressing techniques.    PATIENT EDUCATION: Education details: RUE PROM/AAROM, Pt./caregiver education about possible restorative ROM programs at SNF Person educated: Patient Education method: Explanation, Demonstration, Tactile cues, and Verbal cues Education comprehension: verbal cues required, tactile cues required, and needs further education  HOME EXERCISE PROGRAM: RUE self PROM  GOALS: Goals reviewed with patient? Yes  SHORT TERM GOALS: Target date: 01/16/2024    Pt. Will demonstrate supervision with HEPs for BUEs Baseline: Eval: No current HEPs; 01/02/24: Pt demos self PROM techniques throughout the RUE with intermittent min A and mod vc for accuracy Goal status: in progress  LONG TERM GOALS: Target date: 02/07/2024    1.  Pt. Will improve right shoulder flexion to 45* to assist with UE dressing Baseline: Eval: Right: 0(46); 01/02/24: Right: 25 (90) Goal status: revised on 01/02/24 d/t 20* increase met/ in progress  2.  Pt. Will improve with shoulder abduction by 20 degrees to assist with underarm care, and  hygiene Baseline: Eval: Right 0(64); 01/02/24: Right 0 (90) Goal status: achieved  3.  Pt. Will improve right elbow flexion by 10 degrees to assist with hand to face patterns for grooming Baseline: Eval: Right: 0(98); 01/02/24: Right: 0(131) Goal status: in progress  4.  Pt. Will elicit active wrist extension, and digit extension in preparation for rubbing lotion into the left forearm, and hand  Baseline: Eval: Right wrist: -56 (30); 01/02/24: R wrist: ext: actively to neutral/0 (passively to 40*); R digit extension: no active ext (passively [facility] for this task); unable to self apply lotion to L forearm Goal status: in progress  5.  Pt. Will perform UE dressing skills with MinA Baseline: Eval: Mod-MaxA doffing a jacket; 01/02/24: Mod A to don/doff a jacket Goal status: in progress  6. Pt. Will require minA toileting transfers/ clothing management Baseline: Eval: Pt. Requires increased assist from staff; 01/02/24: pt can transfer to standard height toilet with grab bar for support with min A, but requires mod A to transfer up from toilet; Pt consistently performs wc<>BSC transfer with min A, extra time, and vc for sequencing while using hemi walker; total A for clothing management.  Goal status: in progress  ASSESSMENT:  CLINICAL IMPRESSION:  Pt.'s daughter reports having to leave early to make her appointment for Botox  in [place] this morning.  Pt. continues improve with RUE ROM, and is able to tolerate increased ROM. Pt. needs daily ROM to the RUE 2/2 tightness, and requires assist to perform it thoroughly for herself. Pt. continues to present with increased flexor tone, with significant tightness proximally through the right scapular and pectoral musculature, as well as shoulder musculature, wrist, and digit flexion. Pt. continues to present with strong extensor tone in the right elbow limiting elbow flexion. Pt. requires consistent cues, and hand-over-hand assist to initiate donning/threading the  left sleeve, and to perform UE dressing skills. Fewer cues were required for the remainder of the task. Pt. continues to benefit from OT services to normalize tone in the dominant RUE, improve ROM for underarm hygiene care and UE dressing, as well as to improve toileting care, and clothing negotiation tasks.  Plan  to recert next session.   PERFORMANCE DEFICITS: in functional skills including ADLs, IADLs, coordination, dexterity, proprioception, sensation, ROM, strength, pain, Fine motor control, Gross motor control, balance, body mechanics, decreased knowledge of use of DME, and UE functional use, cognitive skills including problem solving, and psychosocial skills including coping strategies, environmental adaptation, and routines and behaviors.   IMPAIRMENTS: are limiting patient from ADLs, IADLs, and leisure.   CO-MORBIDITIES: may have co-morbidities  that affects occupational performance. Patient will benefit from skilled OT to address above impairments and improve overall function.  MODIFICATION OR ASSISTANCE TO COMPLETE EVALUATION: Min-Moderate modification of tasks or assist with assess necessary to complete an evaluation.  OT OCCUPATIONAL PROFILE AND HISTORY: Detailed assessment: Review of records and additional review of physical, cognitive, psychosocial history related to current functional performance.  CLINICAL DECISION MAKING: Moderate - several treatment options, min-mod task modification necessary  REHAB POTENTIAL: Good  EVALUATION COMPLEXITY: Moderate    PLAN:  OT FREQUENCY: 2x/week  OT DURATION: 12 weeks  PLANNED INTERVENTIONS: 97168 OT Re-evaluation, 97535 self care/ADL training, 40102 therapeutic exercise, 97530 therapeutic activity, 97112 neuromuscular re-education, 97140 manual therapy, 97018 paraffin, 72536 moist heat, 97010 cryotherapy, 97034 contrast bath, passive range of motion, coping strategies training, patient/family education, and DME and/or AE  instructions  RECOMMENDED OTHER SERVICES: PT, ST  CONSULTED AND AGREED WITH PLAN OF CARE: Patient  PLAN FOR NEXT SESSION: see above  Duey Ghent, MS, OTR/L   02/06/2024, 10:05 AM

## 2024-02-06 NOTE — Therapy (Signed)
 OUTPATIENT PHYSICAL THERAPY NEURO TREATMENT/  Re-Certification   Patient Name: Lindsey [name] MRN: [id number] DOB:01-16-1956, 68 y.o., female Today's Date: 02/06/2024   PCP: Lorina Roosevelt, MD  REFERRING PROVIDER: Liam Redhead, MD   END OF SESSION:   PT End of Session - 02/06/24 0915     Visit Number 18    Number of Visits 34    Date for PT Re-Evaluation 04/02/24    Progress Note Due on Visit 20    PT Start Time 0846    PT Stop Time 0926    PT Time Calculation (min) 40 min    Equipment Utilized During Treatment Gait belt   custom R LE AFO   Activity Tolerance Patient tolerated treatment well    Behavior During Therapy WFL for tasks assessed/performed                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   REFERRING DIAG:  Diagnosis  I69.398,R25.2 (ICD-10-CM) - Spasticity as late effect of cerebrovascular accident (CVA)    THERAPY DIAG:  Muscle weakness (generalized)  Apraxia  Other lack of coordination  Unsteadiness on feet  Abnormality of gait and mobility  Difficulty in walking, not elsewhere classified  Other abnormalities of gait and mobility  Rationale for Evaluation and Treatment: Rehabilitation  SUBJECTIVE:  SUBJECTIVE STATEMENT:  Pt with aphasia, responding primarily to yes/no questions. But slightly more verbal today.  Daughter with pictures of bathroom set up. Rail on the L of toilet with BSC over commode.    From EVAL: Daughter present for Evaluation to provide hx. Pt is familiar to this clinic and has been seen for multiple bouts PT since initial CVA in 2021. Pt d/c'ed from PT serviced in April of 2024 with plans to receive PT services through [facility]. Daughter reports that she was only receiving minimal therapy in facility. Reports at least 1 fall in the last 6 months and daughter asked staff to transfer pt with lift since fall.  Daughter would want pt to demonstrate improve safety with transfers to allow bil transfers with reduced use of rail to allow pt to return home for assisted living.  Unable to discern whether pt in SNF or assisted living care from hx.    Pt accompanied by: self and family member   PERTINENT HISTORY:  1. Acute Ischemic Left MCA Stroke:Right Hemiplegia: Ordered outpatient OT, PT, and SLP. She has had a HFU appointment with Neurology. Discussed that I can provide new scripts next month if needed. Communicated with her providers regarding positive reinforcement. She has made excellent gains in  strength as well as cognition and speech!   -Provided with new disability placard.    2. Essential Hypertension: BP is currently very elevated and discussed with patient that it would be unsafe for her to receive injections today as the injections can temporarily increase her pain, which can increase her blood pressure, and put her at risk for stroke. Discussed starting the medication Valsartan  40mg  daily and logging blood pressures daily at home and she is agreeable. Medications reviewed and she is taking Amlodipine  5mg  and lopressor .  Provided with a list of foods that will help lower her BP.  BP reviewed and improved at 136/82 -continue clonidine -Advised checking BP daily at home and logging results to bring into follow-up appointment with PCP and myself. -Reviewed BP meds today.  -Advised regarding healthy foods that can help lower blood pressure and provided with a list:  PAIN:  Are you having pain? No  PRECAUTIONS: Fall  RED FLAGS: None   WEIGHT BEARING RESTRICTIONS: No  FALLS: Has patient fallen in last 6 months? Yes. Number of falls 1  LIVING ENVIRONMENT: Lives with: lives with their family and lives in a skilled nursing facility Lives in: Other SNF  Stairs: No Has following equipment at home: Hemi walker and Wheelchair (manual)  PLOF: Requires assistive device for independence, Needs assistance with ADLs, Needs assistance with homemaking, and WC level currently   PATIENT GOALS: move better - walking or transferring with hemi walking   OBJECTIVE:  Note: Objective measures were completed at Evaluation unless otherwise noted.  DIAGNOSTIC FINDINGS:  Head CT:  IMPRESSION: 1. No acute intracranial abnormality. 2. No acute displaced fracture or traumatic listhesis of the cervical spine.  Cervical CT:  MPRESSION: 1. No acute intracranial abnormality. 2. No acute displaced fracture or traumatic listhesis of the cervical spine.  COGNITION: Overall cognitive status:  History of cognitive impairments - at baseline   SENSATION: Light touch: Impaired  Proprioception: Impaired  Able to detect depe pressure   COORDINATION: Increased tone on the RLE   EDEMA:  Mild RLE distal edema   MUSCLE TONE: RLE: Mild and Moderate  MUSCLE LENGTH: Hamstrings: grossly limited    POSTURE: rounded shoulders, forward head, and posterior bias  LOWER EXTREMITY ROM:     Active  Right Eval Left Eval  Hip flexion  Limited to 5 deg beyond 90 in sitting  Knee flexion  90deg in sitting  Knee extension  Lacking 10 deg full extension  Ankle dorsiflexion  none  Ankle plantarflexion  none   (Blank rows = not tested)  LOWER EXTREMITY MMT:    MMT Right Eval Left Eval  Hip flexion  2+  Hip extension    Hip abduction  2  Hip adduction  3  Hip internal rotation    Hip external rotation    Knee flexion  2+  Knee extension  3+  Ankle dorsiflexion  0  Ankle plantarflexion  0  Ankle inversion    Ankle eversion    (Blank rows = not tested)  BED MOBILITY:  Sit to supine Mod A Supine to sit Mod A Rolling to Right Min A Rolling to Left Min A  TRANSFERS: Assistive device utilized: Hemi walker  Sit to stand: Mod A Stand to sit: Mod A Chair to chair: Mod A Floor:  unable to perform     CURB:  Level of Assistance: Total A Assistive device utilized:  rail Curb Comments: unable to perform   STAIRS: Unable to perform at eval   GAIT: Gait pattern:  non-functional constant posterior bias , step to pattern, decreased stance time- Right, Right hip hike, lateral hip instability, and lateral lean- Left Distance walked: 46ft Assistive device utilized: Hemi walker Level of assistance: Mod A Comments: moderate cues for sequencing of HW and gait pattern in turns   FUNCTIONAL TESTS:  5 times sit to stand: 48 sec with mod Assist from PT  Timed up and go (TUG): unable to perform  10 meter walk test: 6 ft with min-mod assist and HW.  FIST:  Function In Sitting  Test (FIST)  (1/2 femur on surface; hips/knees flexed to 90deg)   - indicate bed or mat table / step stool if used  SCORING KEY: 4 = Independent (completes task independently & successfully) 3 = Verbal Cues/Increased Time (completes task independently & successfully and only needs more time/cues) 2 = Upper Extremity Support (must use UE for support or assistance to complete successfully) 1 = Needs Assistance (unable to complete w/o physical assist; DOCUMENT LEVEL: min, mod, max) 0 = Dependent (requires complete physical assist; unable to complete successfully even w/ physical assist)  Randomly Administer Once Throughout Exam  4 - Anterior Nudge (superior sternum)  4 - Posterior Nudge (between scapular spines)  4 - Lateral Nudge (to dominant side at acromion)     4 - Static sitting (30 seconds)  4 - Sitting, shake 'no' (left and right)  4 - Sitting, eyes closed (30 seconds)   3 - Sitting, lift foot (dominant side, lift foot 1 inch twice)    4 - Pick up object from behind (object at midline, hands breadth posterior)  1 mod assist - Forward reach (use dominant arm, must complete full motion) 1 mod assist - Lateral reach (use dominant arm, clear opposite ischial tuberosity) 1 mod assist  - Pick up object from floor (from between feet)   1 mod assist - Posterior scooting (move backwards 2 inches)  1 min assist  - Anterior scooting (move forward 2 inches)  1 min assist  - Lateral scooting (move to dominant side 2 inches)    TOTAL = 37/56  Notes/comments: apraxia limiting success of scooting and reacting tasks.  MCD > 5 points MCID  for IP REHAB > 6 points      PATIENT SURVEYS:  To be completed                                                                                                                               TREATMENT DATE: 02/06/2024  Pt arrives to therapy in wheelchair. Pt wearing her custom R LE AFO.  Standing at rail on wall Static stance 3 x 30 sec with 1 UE  support  Weight shift R and L x 15 bil with 1 UE support  Gait with HW x 62ft +15ft x 2. Min assist overall on first bout and min assist fading to mod assist on second and third bouts due to increased RLE tone. Only 3 instances with posterior LOB requiring assist to prevent fall.   Sit<>stand x 6 with min assist and moderate cues for proper UE support to push form arm rest and reach back with transition to sit.  Foot tap on 2inch airex pad x 8 bil with min-mod assist from PT for improved knee flexion and reduced circumduction.   Throughout session, PT remained attentive to pt needs for rest breaks due to fatigue and increased tone on the RLE with fatigue. Multiple prolonged therapeutic rest breaks provided throughout session to allow increased   Pt transported to OT gym at end of PT treatment. Left sitting in WC.   PATIENT EDUCATION: Education details: POC.  Pt educated throughout session about proper posture and technique with exercises. Improved exercise technique, movement at target joints, use of target muscles after min to mod verbal, visual, tactile cues.   Person educated: Patient and Child(ren) Education method: Explanation Education comprehension: verbalized understanding  HOME EXERCISE PROGRAM: Access Code: [email or web address] URL: [email or web address] Date: 12/05/2023 Prepared by: Aurora Lees  Exercises - Sit to Stand with Counter Support  - 1 x daily - 3-4 x weekly - 2 sets - 10 reps - Seated Long Arc Quad  - 1 x daily - 3-4 x weekly - 2 sets - 10 reps - 2 hold - Seated March  - 1 x daily - 3-4 x weekly - 2 sets - 10 reps - 2 hold - Standing Hip Abduction with Counter Support  - 1 x daily - 7 x weekly - 3 sets - 5 reps - Wide Stance with Counter Support  - 1 x daily - 7 x weekly - 3 sets - 2 reps - 30 hold - Seated Hip Abduction  - 1 x daily - 7 x weekly - 3 sets - 10 reps - Supine Bridge  - 1 x daily - 7 x weekly - 3 sets - 10 reps - Supine Short Arc Quad  - 1 x  daily - 7 x weekly - 3 sets - 10 reps - Bent Knee Fallouts  - 1 x daily - 7 x weekly - 3 sets - 10 reps - Small Range Straight Leg Raise  - 1  x daily - 7 x weekly - 3 sets - 10 reps - Supine Heel Slide  - 1 x daily - 7 x weekly - 3 sets - 10 reps   GOALS: Goals reviewed with patient? Yes  SHORT TERM GOALS: Target date: 12/13/2023    Patient will be independent in home exercise program to improve strength/mobility for better functional independence with ADLs. Baseline: to be provided on visit 2: 12/28/2023- No caregiver with patient to determine if compliant with HEP Goal status: INITIAL   LONG TERM GOALS: Target date: 04/02/2024       2.  Patient (> 69 years old) will complete five times sit to stand test by 15 seconds and only min assist indicating an increased LE strength and improved balance. Baseline: 48 with mod assist from PT; 12/28/2023= 44 sec from EOM at lowest postion (approx 21 in) with Left UE support and CGA to min A from PT Goal status: Progressing 3.  Patient will increase FIST score by > 6 points to demonstrate decreased fall risk during functional activities Baseline: 37 Goal status: INITIAL  4.  Patient will complete 10 meter walk test to with HW in less than 1 minute with min assist as to improve gait speed for better community ambulation and to reduce fall risk. Baseline: 6 ft with HW and mod assist; 12/28/2023=18 feet with HW- mod assist  Goal status: PROGRESSING  5.  Patient will perform stand pivot transfer to and from [facility] with HW and CGA for safety to allow safe transfer to toilet with daughter without use of rail on the wall. Baseline: mod assist and heavy use of rail. 12/28/2023= Min to Mod assist with repetitive VC for technique Goal status: Progressing  6.  Patient will improve bed mobility to min assist only to reduce care giver burden and improve safety of transfers  Baseline: mod-max assist Goal status: INITIAL  ASSESSMENT:  CLINICAL IMPRESSION:   Patient presents with good motivation for therapy appointment, but late due to toilet needs with daughter. PT treatment focused on functional gait training with HW. Increased distance and reduced assist from PT compared to prior sessions on this day. Was noted to have improved knee flexion with initiation of RLE advancement until fatigue increased tone on the R side, resulting in assist from PT for knee flexion or circumduction to advance.  Ms. Seedorf will benefit from continued skilled PT to address strength and balance deficits to improve safety for transfers, reduce care giver burden, and allow return to PLOF and possible return home from Assisted living. Patient's condition has the potential to improve in response to therapy. Maximum improvement is yet to be obtained. The anticipated improvement is attainable and reasonable in a generally predictable time.    OBJECTIVE IMPAIRMENTS: Abnormal gait, cardiopulmonary status limiting activity, decreased activity tolerance, decreased balance, decreased cognition, decreased coordination, decreased endurance, decreased knowledge of use of DME, decreased mobility, difficulty walking, decreased ROM, decreased strength, hypomobility, increased muscle spasms, impaired flexibility, impaired sensation, impaired tone, impaired UE functional use, improper body mechanics, and postural dysfunction.   ACTIVITY LIMITATIONS: carrying, lifting, bending, standing, squatting, stairs, transfers, bed mobility, continence, bathing, toileting, dressing, reach over head, hygiene/grooming, and locomotion level  PARTICIPATION LIMITATIONS: cleaning, interpersonal relationship, shopping, and community activity  PERSONAL FACTORS: 3+ comorbidities: HTN, chronic CVA, Afib, Lupus   are also affecting patient's functional outcome.   REHAB POTENTIAL: Fair chronic CVA with poor progress in prior interventions with this clinic  CLINICAL DECISION MAKING:  Unstable/unpredictable  EVALUATION  COMPLEXITY: High  PLAN:  PT FREQUENCY: 1-2x/week  PT DURATION: 12 weeks  PLANNED INTERVENTIONS: 97110-Therapeutic exercises, 97530- Therapeutic activity, [id number]- Neuromuscular re-education, 97535- Self Care, [id number]- Manual therapy, [phone]- Gait training, [phone]- Orthotic Fit/training, [phone]- Aquatic Therapy, [phone]- Splinting, [id number]- Electrical stimulation (unattended), [phone]- Electrical stimulation (manual), Patient/Family education, Balance training, Stair training, Joint mobilization, Vestibular training, Visual/preceptual remediation/compensation, Cognitive remediation, DME instructions, Wheelchair mobility training, Cryotherapy, and Moist heat  PLAN FOR NEXT SESSION:  - address safe transfer technique patient can perform with staff at SNF and provide education to patient's family - stretching of quads for tone management to improve knee flexion ROM to allow proper foot positioning underneath BOS during transfers - stretching hip flexors to improve upright posture in standing -  R glute activation and strength during standing  Continue  standing balance. Gait. Transfers.    Aurora Lees PT, DPT  Physical Therapist - [facility]  5:06 PM 02/06/24

## 2024-02-06 NOTE — Therapy (Signed)
 OUTPATIENT SPEECH LANGUAGE PATHOLOGY  TREATMENT NOTE DISCHARGE SUMMARY   Patient Name: Lindsey [name] MRN: [id number] DOB:01-23-1956, 68 y.o., female Today's Date: 02/06/2024  PCP: Mat Solian, MD REFERRING PROVIDER: Laverle Postin, MD    End of Session - 02/06/24 1031     Visit Number 15    Number of Visits 24    Date for SLP Re-Evaluation 02/06/24    Authorization Type Medicare Part A/ Medicare Part B    Progress Note Due on Visit 20    SLP Start Time 0800    SLP Stop Time  0845    SLP Time Calculation (min) 45 min    Activity Tolerance Patient tolerated treatment well                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) 09/24/2021   Cerebral embolism with cerebral infarction 09/23/2021   Pyelonephritis 09/19/2021   Pain due to onychomycosis of toenails of both feet 11/19/2020   Normocytic anemia 05/31/2020   Acute ischemic left MCA stroke (HCC) 04/30/2020   Right hemiplegia (HCC) 04/30/2020   Cerebrovascular accident (HCC) 04/21/2020   Atrial fibrillation with RVR (HCC) 04/21/2020   Essential hypertension 04/21/2020   Alcohol abuse 04/21/2020    ONSET DATE: 04/30/2020 initial CVA; 10/25/2020  second CVA;   11/07/2023 date of referral  REFERRING DIAG: R47.01 (ICD-10-CM) - Aphasia   THERAPY DIAG:  Aphasia  Apraxia  Rationale for Evaluation and Treatment Rehabilitation  SUBJECTIVE:   PERTINENT HISTORY and DIAGNOSTIC FINDINGS:   Patient is a 68 y.o. female with the following prior history:  MRI 04/21/2020 Brain: Acute infarct left MCA territory. Infarct involves the left frontal lobe, insula as well as the caudate and putamen. There are areas of hemorrhage within the caudate and putamen as well as in the left frontal lobe.  Scattered small white matter hyperintensities elsewhere compatible with chronic microvascular ischemia. Ventricle size normal. Negative for mass lesion.  Inpatient Rehabilitation ST Services: 04/2020 thru 05/2020 Outpatient ST services 08/2020 thru 08/2021 Pt discharged with [employer] Device; per chart using device to assist with communication  MRI 09/22/2021 IMPRESSION: 1. Acute moderate size Left AICA cerebellar infarct. Petechial hemorrhage but no malignant hemorrhagic transformation. Cytotoxic edema but no posterior fossa mass effect at this time.  2. Underlying extensive chronic left MCA territory encephalomalacia related to the 2021 infarct. Associated left brainstem Wallerian degeneration since last year. And small superimposed chronic Left PICA cerebellar infarcts, but also new since last year.   Inpatient Rehabilitation ST Services at [facility]: 10/02/2021 thru 10/21/2021 Outpatient ST Services [facility] 10/2021 thru 03/08/2022 Pt discharged d/t reaching maximal rehabilitation potential; "3/4 LTGs particially met due to limited caregiver availability"  Pt is currently residing at a SNF [facility]). Additional medical history includes alcoholism,  AFib, Renal Artery thrombosis, situational depression, HTN, Pyelnephritis, Seizure activity, Constipation, AKI. Vitamin D  deficiency, and urinary incontinence.   PAIN:  Are you having pain? No  FALLS:  Has patient fallen in last 6 months?  See PT evaluation for details  LIVING ENVIRONMENT: Lives with: lives with their family and lives in a skilled nursing facility   PLOF:  Level of assistance: Needed assistance with ADLs, Needed assistance with IADLS   PATIENT GOALS    pt unable to state - her daughter reports she would like for pt to be able to "communicate basics, return to abilities before second stroke (09/2021)"  SUBJECTIVE STATEMENT: Pt eager, accompanied by her daughter Pt accompanied by: self and her daughter  OBJECTIVE:   TODAY'S TREATMENT:     Skilled treatment session focused on her communication goals. SLP facilitated the session by providing the following interventions:  This Clinical research associate had a Occupational hygienist with [employer] regarding ways to facilitate use of SGD in pt's SNF by reducing the risk of it being taken on Friday May 2. SLP from [employer] to reach out to pt's daughter regarding potential mount as well as [employer]'s ability to provide training for staff at facility. This information was given to pt's daughter during today's session. This Clinical research associate also sent email to SLP with [facility] regarding when he might follow up with daughter. No response yet.    Continued skilled information provided to pt (and her daughter) on need to participate in activities that promote language use within her facility. Pt continues to shake her head (indicating decreased interest) and says "well." Education provided on (if barriers to use of SGD can be addressed thru [employer]) then it would recommended that pt would need to use the device for functional communication. Pt again indicated that she was hesitant to us  SGD - pt has history of decreased use of device during previous sessions and per daughter's report. .   At this point in pt's recovery, pt would benefit from continued exposure to language vs residing alone in her room. Pt voiced understanding.   Finally, basic  communication apps were shown to pt and her daughter to potentially add to an old cell phone that might have less monetary value than SGD in an effort to help pt have additional modalities of communication.    PATIENT EDUCATION: Education details: use of low Economist  Person educated: Patient and Child(ren) Education method: Explanation Education comprehension: needs further education  HOME EXERCISE PROGRAM:   TalkPath Therapy App Increase her involvement in activities provided by facility   GOALS:  Goals reviewed with patient? Yes  SHORT TERM GOALS: Target date: 10 sessions  Given verbal or visual prompt, pt will correctly ID written word/object/icon in field of 4-6 to communicate wants/needs with 75% accuracy given occasional mod A over 2 sessions  Baseline: Goal status: INITIAL: progress made, staff is reportedly using at nursing home: MET per report that pt is using it in the facility   LONG TERM GOALS: Target date: 02/06/2024  Pt will utilize multimodal communication to express basic wants/needs as reported by pt and her daughter in 3 out of 5 opportunities.  Baseline:  Goal status: INITIAL: progress made: pt is self-limiting in participating in activities that provide her opportunities for communication   ASSESSMENT:  CLINICAL IMPRESSION: Patient is a 68 y.o. right handed female who was seen today for a speech language treatment d/t history  of Left MCA CVA. 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 Pt's daughter reports that pt doesn't use her [employer] device "as much as she should"   Despite extensive education, pt continues to be hesitant to participate in activities within her facility or go to dining room for meals. At this point (almost 4 years time post onset of pt's CVA), isolated therapy activities within outpatient ST services are not transferring  to functional communication but language learning requires repeated practice and engagement with language. Functional language improvement in this phase is marked by compensatory reorganization of cognition. All education has been completed on ways to compensate with pt continuing to express disinterest. Restoring neural pathways is greatly reduced given time post onset.   Pt has met maximal benefit from skilled ST and all education has been completed on activities to continue language abilities (TalkPath Therapy App, [employer] Therapy) and examples of activities within facility to help with language use.   OBJECTIVE IMPAIRMENTS include attention, aphasia, apraxia, and dysarthria. These impairments are limiting patient from effectively communicating at home and in community. Factors affecting potential to achieve goals and functional outcome are ability to learn/carryover information, co-morbidities, cooperation/participation level, medical prognosis, previous level of function, severity of impairments, family/community support, and time post onset .   REHAB POTENTIAL: Fair time post onset, multiple courses of ST services, decreased carryover of therapy concepts during previous sessions; pt discharged from previous course of ST services (2023) requiring moderate assistance for communication  PLAN:  Pt is appropriate for discharge from services. She is at her chronic baseline of language abilities.    Patrich Heinze B. Garlin Junker, M.S., CCC-SLP, Tree surgeon Certified Brain Injury Specialist [facility]  [facility] Services Office [phone] Ascom [phone] Fax [phone]

## 2024-02-06 NOTE — Progress Notes (Signed)
 Botox  Injection for spasticity, palpation and activation approach, US  guidance used   Dilution: 1:1 Indication: Severe spasticity which interferes with ADL,mobility and/or  hygiene and is unresponsive to medication management and other conservative care   Informed consent was obtained after describing risks and benefits of the procedure with the patient. This includes bleeding, bruising, infection, excessive weakness, or medication side effects. A REMS form is on file and signed.  1) Flexor digitorum profundus: 50U in 1 site 2) Flexor carpi radialis: 50U U in 1 site 3) Flexor digitorum superficialis: 50U in 1 site 4) Flexor digitorum profundus: 50U 5) Pronator teres: 50U 6) Biceps: 50U     All injections were done after palpation and patient activation of the muscle confirmed the accurate location, and after negative drawback for blood. The patient tolerated the procedure well. Post procedure instructions were given. A followup appointment was made.

## 2024-02-08 ENCOUNTER — Ambulatory Visit: Payer: Medicare Other | Admitting: Speech Pathology

## 2024-02-08 ENCOUNTER — Ambulatory Visit: Payer: Medicare Other | Admitting: Physical Therapy

## 2024-02-08 ENCOUNTER — Ambulatory Visit: Payer: Medicare Other | Admitting: Occupational Therapy

## 2024-02-08 DIAGNOSIS — R2689 Other abnormalities of gait and mobility: Secondary | ICD-10-CM

## 2024-02-08 DIAGNOSIS — R262 Difficulty in walking, not elsewhere classified: Secondary | ICD-10-CM

## 2024-02-08 DIAGNOSIS — M6281 Muscle weakness (generalized): Secondary | ICD-10-CM

## 2024-02-08 DIAGNOSIS — R278 Other lack of coordination: Secondary | ICD-10-CM

## 2024-02-08 DIAGNOSIS — R482 Apraxia: Secondary | ICD-10-CM

## 2024-02-08 DIAGNOSIS — R269 Unspecified abnormalities of gait and mobility: Secondary | ICD-10-CM

## 2024-02-08 DIAGNOSIS — R2681 Unsteadiness on feet: Secondary | ICD-10-CM

## 2024-02-08 DIAGNOSIS — R4701 Aphasia: Secondary | ICD-10-CM | POA: Diagnosis not present

## 2024-02-08 NOTE — Therapy (Addendum)
 OUTPATIENT OCCUPATIONAL THERAPY NEURO TREATMENT/RECERTIFICATION NOTE   Patient Name: Lindsey [name] MRN: [id number] DOB:18-Feb-1956, 68 y.o., female Today's Date: 02/08/2024  PCP: Lorina Roosevelt, MD REFERRING PROVIDER: Laverle Postin, MD    OT End of Session - 02/08/24 1137     Visit Number 18    Number of Visits 24    Date for OT Re-Evaluation 05/02/24    OT Start Time 0930    OT Stop Time 1015    OT Time Calculation (min) 45 min    Activity Tolerance Patient tolerated treatment well    Behavior During Therapy WFL for tasks assessed/performed              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) 09/24/2021   Cerebral embolism with cerebral infarction 09/23/2021   Pyelonephritis 09/19/2021   Pain due to onychomycosis of toenails of both feet 11/19/2020   Normocytic anemia 05/31/2020   Acute ischemic left MCA stroke (HCC) 04/30/2020   Right hemiplegia (HCC) 04/30/2020   Cerebrovascular accident (HCC) 04/21/2020   Atrial fibrillation with RVR (HCC) 04/21/2020   Essential hypertension 04/21/2020   Alcohol abuse 04/21/2020   ONSET DATE: 09/19/21  REFERRING DIAG: CVA  THERAPY DIAG:  Muscle weakness  (generalized)  Rationale for Evaluation and Treatment: Rehabilitation  SUBJECTIVE:  SUBJECTIVE STATEMENT:  Pt. reports looking forward to spending time with her family on Mother's Day. Pt Pt accompanied by: self, daughter  PERTINENT HISTORY: Pt. is a 68 y.o. 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 Pt. has Right sided weakness, and receptive, and expressive aphasia. Pt. has had a previous CVA with right sided weakness in July 2021. PMHx includes: AFib, Renal Artery thrombosis, situational depression, HTN, Pyelnephritis, Seizure activity, COnstipation, AKI. Vitamin D  deficiency, and urinary incontinence   PRECAUTIONS: None  WEIGHT BEARING RESTRICTIONS: No  PAIN:  Are you having pain? 4/10 FACES tightness/aching pain in the RUE  FALLS: Has patient fallen in last 6 months? Yes. Number of falls 1-ER visit  LIVING ENVIRONMENT: Pt. resides at [facility]. Pt. has very supportive family Stairs: One level Has following equipment at home:  Equipment provided through [employer]  PLOF: Needs assistance with ADLs  PATIENT GOALS:  To improve ROM/flexibility in the RUE 2/2 increased tightness, and to improve ADL functioning in order to towards returning home.  OBJECTIVE:   Note: Objective measures were completed at Evaluation unless otherwise noted.  HAND DOMINANCE: Right  ADLs: Overall ADLs: Assist is provided from the staff at [facility]. Transfers/ambulation related to ADLs: Eating: independent, with set-up using the left hand Grooming: Independent with set-up using the left hand  UB Dressing: Increased assist required, Mod-MaxA doffing jacket LB Dressing: Increased assist required Toileting:  Staff Assists with toileting transfers, and toilet hygiene care. Bathing: Staff assists with bathing Tub Shower transfers: Requires staff assist Equipment: TBD  IADLs: Shopping: N/A Light housekeeping:  Provided by staff Meal Prep: 3 Meals a day provided. Pt. Eats in the main dining room when family visits, otherwise Pt. Eats in her room.  Community mobility: Relies on family for transportation to therapy. Per daughter- Transportation is provided for services not offered at the facility. Medication management: Provided by the facility Financial management: Daughter manages Handwriting: TBD  MOBILITY STATUS: Hx of falls  FUNCTIONAL OUTCOME MEASURES:  UPPER EXTREMITY ROM:    Passive ROM Left Eval AROM WFL Right Eval  Right 01/02/24 Right 02/08/24  Shoulder flexion  0(46) 25 (90) 0(94)  Shoulder abduction  0(64) 0 (90) 0(78)  Shoulder adduction      Shoulder extension      Shoulder internal rotation      Shoulder external rotation   (25) 0(25)  Elbow flexion  0(98) 0(131) 0(133)  Elbow extension  0(full passive) 0 (full passive) 0(full passive)  Wrist flexion  0(56) 0(90) 0(74)  Wrist extension  -56(30) 0 (40) 6(42)  Wrist ulnar deviation      Wrist radial deviation      Wrist pronation      Wrist supination      (Blank rows = not tested)  UPPER EXTREMITY MMT:     MMT Left Eval 5/5 Right eval Right  01/02/24 Right 02/08/24  Shoulder flexion  0/5 2/5  0/5  Shoulder abduction  0/5 1/5 0/5  Shoulder adduction   2-/5 1/5  Shoulder extension      Shoulder internal rotation   1/5 0/5  Shoulder external rotation      Middle trapezius      Lower trapezius      Elbow flexion  0/5 0/5 0/5  Elbow extension  0/5 2/5 1/5  Wrist flexion   1/5   Wrist extension  0/5 3-/5 1/5  Wrist ulnar deviation      Wrist radial deviation      Wrist pronation  0/5 0/5 1/5  Wrist supination  0/5 0/5 0/5  Digit flexors   2-/5 2-/5  Digit extensors   0/5 0/5  (Blank rows = not tested)  HAND FUNCTION: N/A  COORDINATION: N/A  SENSATION: Diminished  EDEMA: No edema noted in the right hand today  MUSCLE TONE: impaired  COGNITION: Overall cognitive status: Aphasia  VISION: Wears  glasses, no change from baseline  PERCEPTION: TBD  PRAXIS: Impaired: Motor planning                                                                                                                           TREATMENT DATE: 02/08/24    Manual therapy:   -Soft tissue massage to the scapular, pectoral, and right shoulder musculature. -Soft tissue mobilizations to promote  radius on ulna motion needed for supination.  -Manual therapy was performed independent of, and in preparation for therapeutic Ex.  Therapeutic Exercise:   -PROM through the following joint ranges of the RUE, including: shoulder flexion, abduction, ER, horizontal abduction, elbow flexion, extension, forearm supination, wrist extension, digit extension, and thumb abduction, working to increase RUE mobility to manage hemiparetic limb during basic ADLs, as well as reducing contracture risk. Janine Melbourne for R shoulder flex/ext, horizontal  abduction/adduction. -Cues, and hand over hand assist were required for self-ROM HEP for the UE.  PATIENT EDUCATION: Education details: RUE PROM/AAROM, Pt./caregiver education about possible restorative ROM programs at SNF Person educated: Patient Education method: Explanation, Demonstration, Tactile cues, and Verbal cues Education comprehension: verbal cues required, tactile cues required, and needs further education  HOME EXERCISE PROGRAM: RUE self PROM  GOALS: Goals reviewed with patient? Yes  SHORT TERM GOALS: Target date: 01/16/2024    Pt. Will demonstrate supervision with HEPs for BUEs Baseline: Eval: No current HEPs; 01/02/24: Pt demos self PROM techniques throughout the RUE with intermittent min A and mod vc for accuracy 02/08/24: Pt. requires cues for initiation, and hand over hand assist. Goal status: in progress  LONG TERM GOALS: Target date: 02/07/2024    1.  Pt. Will improve right shoulder flexion to 45* to assist with UE dressing Baseline: Eval: Right: 0(46); 01/02/24: Right: 25  (90) 02/08/24: 0(94) Goal status:  Ongoing  2.  Pt. Will improve with shoulder abduction by 20 degrees to assist with underarm care, and hygiene Baseline: Eval: Right 0(64); 01/02/24: Right 0 (90) 02/08/24: 0(78) Goal status: Ongoing  3.  Pt. Will improve right elbow flexion by 10 degrees to assist with hand to face patterns for grooming Baseline: Eval: Right: 0(98); 01/02/24: Right: 0(131) 02/08/24: 1(610) Goal status: in progress  4.  Pt. Will elicit active wrist extension, and digit extension in preparation for rubbing lotion into the left forearm, and hand  Baseline: Eval: Right wrist: -56 (30); 01/02/24: R wrist: ext: actively to neutral/0 (passively to 40*); R digit extension: no active ext (passively [facility] for this task); unable to self apply lotion to L forearm 02/08/24: right wrist extension: 6(42) Goal status: in progress  5.  Pt. Will perform UE dressing skills with MinA Baseline: Eval: Mod-MaxA doffing a jacket; 01/02/24: Mod A to don/doff a jacket 02/08/24: ModA to thread the right sleeve, minA to bring the shirt around the back of the neck, and donn the left sleeve. ModA to align seams.  Goal status: in progress  6. Pt. Will require minA toileting transfers/ clothing management Baseline: Eval: Pt. Requires increased assist from staff; 01/02/24: pt can transfer to standard height toilet with grab bar for support with min A, but requires mod A to transfer up from toilet; Pt consistently performs wc<>BSC transfer with min A, extra time, and vc for sequencing while using hemi walker; total A for clothing management. 02/08/24: MinA, Total assist clothing management  Goal status: in progress  ASSESSMENT:  CLINICAL IMPRESSION:  Pt. presents with limited activation of active muscle responses in the RUE. Minimal activation of wrist extensors noted.  Pt. presents with increased tone, and tightness throughout the RUE limiting AROM. Pt. presents with limited engagement of the RUE during daily ADLs, and  IADL tasks.  Pt. has progressed with UE dressing skills, and toilet transfers over this past certification period. Pt. Is appropriate for a change of frequency for OT from 2x's a week to 1x a week.  Pt. continues to  benefit from OT services to normalize tone in the dominant RUE, improve ROM for underarm hygiene care and UE dressing, as well as to improve toileting care, and clothing negotiation tasks.    PERFORMANCE DEFICITS: in functional skills including ADLs, IADLs, coordination, dexterity, proprioception, sensation, ROM, strength, pain, Fine motor control, Gross motor control, balance, body mechanics, decreased knowledge of use of DME, and UE functional use, cognitive skills including problem solving, and psychosocial skills including coping strategies, environmental adaptation, and routines and behaviors.   IMPAIRMENTS: are limiting patient from ADLs, IADLs, and leisure.   CO-MORBIDITIES: may have co-morbidities  that affects occupational performance. Patient will benefit from skilled OT to address above impairments and improve overall function.  MODIFICATION OR ASSISTANCE TO COMPLETE EVALUATION: Min-Moderate modification of tasks or assist with assess necessary to complete an evaluation.  OT OCCUPATIONAL PROFILE AND HISTORY: Detailed assessment: Review of records and additional review of physical, cognitive, psychosocial history related to current functional performance.  CLINICAL DECISION MAKING: Moderate - several treatment options, min-mod task modification necessary  REHAB POTENTIAL: Good  EVALUATION COMPLEXITY: Moderate    PLAN:  OT FREQUENCY: 1x/week  OT DURATION: 12 weeks  PLANNED INTERVENTIONS: 97168 OT Re-evaluation, 97535 self care/ADL training, 40981 therapeutic exercise, 97530 therapeutic activity, 97112 neuromuscular re-education, 97140 manual therapy, 97018 paraffin, 19147 moist heat, 97010 cryotherapy, 97034 contrast bath, passive range of motion, coping strategies  training, patient/family education, and DME and/or AE instructions  RECOMMENDED OTHER SERVICES: PT, ST  CONSULTED AND AGREED WITH PLAN OF CARE: Patient  PLAN FOR NEXT SESSION: see above  Duey Ghent, MS, OTR/L   02/08/2024, 11:38 AM

## 2024-02-08 NOTE — Therapy (Signed)
 OUTPATIENT PHYSICAL THERAPY NEURO TREATMENT/  Re-Certification   Patient Name: Lindsey [name] MRN: [id number] DOB:[birth year], 68 y.o., female Today's Date: 02/08/2024   PCP: Lorina Roosevelt, MD  REFERRING PROVIDER: Liam Redhead, MD   END OF SESSION:   PT End of Session - 02/08/24 0850     Visit Number 19    Number of Visits 34    Date for PT Re-Evaluation 04/02/24    Progress Note Due on Visit 20    PT Start Time 0849    PT Stop Time 0929    PT Time Calculation (min) 40 min    Equipment Utilized During Treatment Gait belt   custom R LE AFO   Activity Tolerance Patient tolerated treatment well    Behavior During Therapy WFL for tasks assessed/performed                Past Medical History:  Diagnosis Date   Aphasia    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   REFERRING DIAG:  Diagnosis  I69.398,R25.2 (ICD-10-CM) - Spasticity as late effect of cerebrovascular accident (CVA)    THERAPY DIAG:  Muscle weakness (generalized)  Apraxia  Other lack of coordination  Unsteadiness on feet  Difficulty in walking, not elsewhere classified  Abnormality of gait and mobility  Other abnormalities of gait and mobility  Rationale for Evaluation and Treatment: Rehabilitation  SUBJECTIVE:  SUBJECTIVE STATEMENT:  Pt with aphasia, responding primarily to yes/no questions. But slightly more verbal today.  Daughter with pictures of bathroom set up. Rail on the L of toilet with BSC over commode.    From EVAL: Daughter present for Evaluation to provide hx. Pt is familiar to this clinic and has been seen for multiple bouts PT since initial CVA in 2021. Pt d/c'ed from PT serviced in April of 2024 with plans to receive PT services through [facility]. Daughter reports that she was only receiving minimal therapy in facility. Reports at least 1 fall in the last 6 months and daughter asked staff to transfer pt with lift since fall.  Daughter would want pt to demonstrate improve safety with transfers to allow bil transfers with reduced use of rail to allow pt to return home for assisted living.  Unable to discern whether pt in SNF or assisted living care from hx.    Pt accompanied by: self and family member   PERTINENT HISTORY:  1. Acute Ischemic Left MCA Stroke:Right Hemiplegia: Ordered outpatient OT, PT, and SLP. She has had a HFU appointment with Neurology. Discussed that I can provide new scripts next month if needed. Communicated with her providers regarding positive reinforcement. She has made excellent gains in  strength as well as cognition and speech!   -Provided with new disability placard.    2. Essential Hypertension: BP is currently very elevated and discussed with patient that it would be unsafe for her to receive injections today as the injections can temporarily increase her pain, which can increase her blood pressure, and put her at risk for stroke. Discussed starting the medication Valsartan  40mg  daily and logging blood pressures daily at home and she is agreeable. Medications reviewed and she is taking Amlodipine  5mg  and lopressor .  Provided with a list of foods that will help lower her BP.  BP reviewed and improved at 136/82 -continue clonidine -Advised checking BP daily at home and logging results to bring into follow-up appointment with PCP and myself. -Reviewed BP meds today.  -Advised regarding healthy foods that can help lower blood pressure and provided with a list:  PAIN:  Are you having pain? No  PRECAUTIONS: Fall  RED FLAGS: None   WEIGHT BEARING RESTRICTIONS: No  FALLS: Has patient fallen in last 6 months? Yes. Number of falls 1  LIVING ENVIRONMENT: Lives with: lives with their family and lives in a skilled nursing facility Lives in: Other SNF  Stairs: No Has following equipment at home: Hemi walker and Wheelchair (manual)  PLOF: Requires assistive device for independence, Needs assistance with ADLs, Needs assistance with homemaking, and WC level currently   PATIENT GOALS: move better - walking or transferring with hemi walking   OBJECTIVE:  Note: Objective measures were completed at Evaluation unless otherwise noted.  DIAGNOSTIC FINDINGS:  Head CT:  IMPRESSION: 1. No acute intracranial abnormality. 2. No acute displaced fracture or traumatic listhesis of the cervical spine.  Cervical CT:  MPRESSION: 1. No acute intracranial abnormality. 2. No acute displaced fracture or traumatic listhesis of the cervical spine.  COGNITION: Overall cognitive status:  History of cognitive impairments - at baseline   SENSATION: Light touch: Impaired  Proprioception: Impaired  Able to detect depe pressure   COORDINATION: Increased tone on the RLE   EDEMA:  Mild RLE distal edema   MUSCLE TONE: RLE: Mild and Moderate  MUSCLE LENGTH: Hamstrings: grossly limited    POSTURE: rounded shoulders, forward head, and posterior bias  LOWER EXTREMITY ROM:     Active  Right Eval Left Eval  Hip flexion  Limited to 5 deg beyond 90 in sitting  Knee flexion  90deg in sitting  Knee extension  Lacking 10 deg full extension  Ankle dorsiflexion  none  Ankle plantarflexion  none   (Blank rows = not tested)  LOWER EXTREMITY MMT:    MMT Right Eval Left Eval  Hip flexion  2+  Hip extension    Hip abduction  2  Hip adduction  3  Hip internal rotation    Hip external rotation    Knee flexion  2+  Knee extension  3+  Ankle dorsiflexion  0  Ankle plantarflexion  0  Ankle inversion    Ankle eversion    (Blank rows = not tested)  BED MOBILITY:  Sit to supine Mod A Supine to sit Mod A Rolling to Right Min A Rolling to Left Min A  TRANSFERS: Assistive device utilized: Hemi walker  Sit to stand: Mod A Stand to sit: Mod A Chair to chair: Mod A Floor: unable to perform     CURB:  Level of Assistance: Total A Assistive device utilized: rail Curb Comments: unable to perform   STAIRS: Unable to perform at eval   GAIT: Gait pattern: non-functional constant posterior bias , step to pattern, decreased stance time- Right, Right hip hike, lateral hip instability, and lateral lean- Left Distance walked: 3ft Assistive device utilized: Hemi walker Level of assistance: Mod A Comments: moderate cues for sequencing of HW and gait pattern in turns   FUNCTIONAL TESTS:  5 times sit to stand: 48 sec with mod Assist from PT  Timed up and go (TUG): unable to perform  10 meter walk test: 6 ft with min-mod assist and HW.  FIST:  Function In Sitting Test  (FIST)  (1/2 femur on surface; hips/knees flexed to 90deg)   - indicate bed or mat table / step stool if used  SCORING KEY: 4 = Independent (completes task independently & successfully) 3 = Verbal Cues/Increased Time (completes task independently & successfully and only needs more time/cues) 2 = Upper Extremity Support (must use UE for support or assistance to complete successfully) 1 = Needs Assistance (unable to complete w/o physical assist; DOCUMENT LEVEL: min, mod, max) 0 = Dependent (requires complete physical assist; unable to complete successfully even w/ physical assist)  Randomly Administer Once Throughout Exam  4 - Anterior Nudge (superior sternum)  4 - Posterior Nudge (between scapular spines)  4 - Lateral Nudge (to dominant side at acromion)     4 - Static sitting (30 seconds)  4 - Sitting, shake 'no' (left and right)  4 - Sitting, eyes closed (30 seconds)   3 - Sitting, lift foot (dominant side, lift foot 1 inch twice)    4 - Pick up object from behind (object at midline, hands breadth posterior)  1 mod assist - Forward reach (use dominant arm, must complete full motion) 1 mod assist - Lateral reach (use dominant arm, clear opposite ischial tuberosity) 1 mod assist  - Pick up object from floor (from between feet)   1 mod assist - Posterior scooting (move backwards 2 inches)  1 min assist  - Anterior scooting (move forward 2 inches)  1 min assist  - Lateral scooting (move to dominant side 2 inches)    TOTAL = 37/56  Notes/comments: apraxia limiting success of scooting and reacting tasks.  MCD > 5 points MCID for IP REHAB >  6 points      PATIENT SURVEYS:  To be completed                                                                                                                               TREATMENT DATE: 02/08/2024  Pt arrives to therapy in wheelchair. Pt wearing her custom R LE AFO.  Stand pivot transfer to mat table with min assist and cues for  awareness of posterior LOB.   Sit<>supine with min assist on the RLE.  SAQ x 12 bil  PROM Seated hip/knee flexion 2 x 30 sec bil  LE press down and AAROM hip/knee flexion x 12 bil  Bridge x 10  Rolling R and L x 3 bil  Sidelying hip abduction x 10 bil, AAROM on theRLE Sidelying clam shell x 12 bil  SLR x 10  Sidelying HS curl AAROM x 12  Sit<>stand x 5 with min-mod assist for sequencing.   Gait with HW x 78ft with min-mod assist and min cues for AD management and improved posture to prevent posterior LOB    Pt transported to OT gym at end of PT treatment. Left sitting in WC.   PATIENT EDUCATION: Education details: POC.  Pt educated throughout session about proper posture and technique with exercises. Improved exercise technique, movement at target joints, use of target muscles after min to mod verbal, visual, tactile cues.   Person educated: Patient and Child(ren) Education method: Explanation Education comprehension: verbalized understanding  HOME EXERCISE PROGRAM: Access Code: [email or web address] URL: [email or web address] Date: 12/05/2023 Prepared by: Aurora Lees  Exercises - Sit to Stand with Counter Support  - 1 x daily - 3-4 x weekly - 2 sets - 10 reps - Seated Long Arc Quad  - 1 x daily - 3-4 x weekly - 2 sets - 10 reps - 2 hold - Seated March  - 1 x daily - 3-4 x weekly - 2 sets - 10 reps - 2 hold - Standing Hip Abduction with Counter Support  - 1 x daily - 7 x weekly - 3 sets - 5 reps - Wide Stance with Counter Support  - 1 x daily - 7 x weekly - 3 sets - 2 reps - 30 hold - Seated Hip Abduction  - 1 x daily - 7 x weekly - 3 sets - 10 reps - Supine Bridge  - 1 x daily - 7 x weekly - 3 sets - 10 reps - Supine Short Arc Quad  - 1 x daily - 7 x weekly - 3 sets - 10 reps - Bent Knee Fallouts  - 1 x daily - 7 x weekly - 3 sets - 10 reps - Small Range Straight Leg Raise  - 1 x daily - 7 x weekly - 3 sets - 10 reps - Supine Heel Slide  - 1 x daily - 7 x weekly - 3  sets - 10 reps   GOALS: Goals reviewed  with patient? Yes  SHORT TERM GOALS: Target date: 12/13/2023    Patient will be independent in home exercise program to improve strength/mobility for better functional independence with ADLs. Baseline: to be provided on visit 2: 12/28/2023- No caregiver with patient to determine if compliant with HEP Goal status: INITIAL   LONG TERM GOALS: Target date: 04/02/2024       2.  Patient (> 80 years old) will complete five times sit to stand test by 15 seconds and only min assist indicating an increased LE strength and improved balance. Baseline: 48 with mod assist from PT; 12/28/2023= 44 sec from EOM at lowest postion (approx 21 in) with Left UE support and CGA to min A from PT Goal status: Progressing 3.  Patient will increase FIST score by > 6 points to demonstrate decreased fall risk during functional activities Baseline: 37 Goal status: INITIAL  4.  Patient will complete 10 meter walk test to with HW in less than 1 minute with min assist as to improve gait speed for better community ambulation and to reduce fall risk. Baseline: 6 ft with HW and mod assist; 12/28/2023=18 feet with HW- mod assist  Goal status: PROGRESSING  5.  Patient will perform stand pivot transfer to and from [facility] with HW and CGA for safety to allow safe transfer to toilet with daughter without use of rail on the wall. Baseline: mod assist and heavy use of rail. 12/28/2023= Min to Mod assist with repetitive VC for technique Goal status: Progressing  6.  Patient will improve bed mobility to min assist only to reduce care giver burden and improve safety of transfers  Baseline: mod-max assist Goal status: INITIAL  ASSESSMENT:  CLINICAL IMPRESSION:  Patient presents with good motivation for therapy appointment, but late due to toilet needs with daughter. PT treatment focused on improved isolated BLE activation. Noted improvement of RLE activation into flexion and improve ROM of  knee/hip flexion. Reduced assist for transfers on this day with improved sequencing consistently.   Ms. Dant will benefit from continued skilled PT to address strength and balance deficits to improve safety for transfers, reduce care giver burden, and allow return to PLOF and possible return home from Assisted living.    OBJECTIVE IMPAIRMENTS: Abnormal gait, cardiopulmonary status limiting activity, decreased activity tolerance, decreased balance, decreased cognition, decreased coordination, decreased endurance, decreased knowledge of use of DME, decreased mobility, difficulty walking, decreased ROM, decreased strength, hypomobility, increased muscle spasms, impaired flexibility, impaired sensation, impaired tone, impaired UE functional use, improper body mechanics, and postural dysfunction.   ACTIVITY LIMITATIONS: carrying, lifting, bending, standing, squatting, stairs, transfers, bed mobility, continence, bathing, toileting, dressing, reach over head, hygiene/grooming, and locomotion level  PARTICIPATION LIMITATIONS: cleaning, interpersonal relationship, shopping, and community activity  PERSONAL FACTORS: 3+ comorbidities: HTN, chronic CVA, Afib, Lupus  are also affecting patient's functional outcome.   REHAB POTENTIAL: Fair chronic CVA with poor progress in prior interventions with this clinic  CLINICAL DECISION MAKING: Unstable/unpredictable  EVALUATION COMPLEXITY: High  PLAN:  PT FREQUENCY: 1-2x/week  PT DURATION: 12 weeks  PLANNED INTERVENTIONS: 97110-Therapeutic exercises, 97530- Therapeutic activity, [id number]- Neuromuscular re-education, 97535- Self Care, 16109- Manual therapy, [phone]- Gait training, [phone]- Orthotic Fit/training, [phone]- Aquatic Therapy, [phone]- Splinting, [id number]- Electrical stimulation (unattended), [phone]- Electrical stimulation (manual), Patient/Family education, Balance training, Stair training, Joint mobilization, Vestibular training, Visual/preceptual  remediation/compensation, Cognitive remediation, DME instructions, Wheelchair mobility training, Cryotherapy, and Moist heat  PLAN FOR NEXT SESSION:   -  R glute activation and strength during standing  Continue  standing balance. Gait. Transfers.    Aurora Lees PT, DPT  Physical Therapist - [facility]  12:18 PM 02/08/24

## 2024-02-13 ENCOUNTER — Ambulatory Visit: Payer: Medicare Other | Admitting: Occupational Therapy

## 2024-02-13 ENCOUNTER — Ambulatory Visit: Payer: Medicare Other | Admitting: Physical Therapy

## 2024-02-13 ENCOUNTER — Ambulatory Visit: Payer: Medicare Other | Admitting: Speech Pathology

## 2024-02-13 ENCOUNTER — Encounter: Payer: Medicare Other | Admitting: Speech Pathology

## 2024-02-13 DIAGNOSIS — M6281 Muscle weakness (generalized): Secondary | ICD-10-CM

## 2024-02-13 DIAGNOSIS — R4701 Aphasia: Secondary | ICD-10-CM | POA: Diagnosis not present

## 2024-02-13 DIAGNOSIS — R278 Other lack of coordination: Secondary | ICD-10-CM

## 2024-02-13 DIAGNOSIS — R269 Unspecified abnormalities of gait and mobility: Secondary | ICD-10-CM

## 2024-02-13 DIAGNOSIS — R482 Apraxia: Secondary | ICD-10-CM

## 2024-02-13 DIAGNOSIS — R2681 Unsteadiness on feet: Secondary | ICD-10-CM

## 2024-02-13 DIAGNOSIS — R2689 Other abnormalities of gait and mobility: Secondary | ICD-10-CM

## 2024-02-13 DIAGNOSIS — R262 Difficulty in walking, not elsewhere classified: Secondary | ICD-10-CM

## 2024-02-13 NOTE — Therapy (Signed)
 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) 09/24/2021   Cerebral embolism with cerebral infarction 09/23/2021   Pyelonephritis 09/19/2021   Pain due to onychomycosis of toenails of both feet 11/19/2020   Normocytic anemia 05/31/2020   Acute ischemic left MCA stroke (HCC) 04/30/2020   Right hemiplegia (HCC) 04/30/2020   Cerebrovascular accident (HCC) 04/21/2020   Atrial fibrillation with RVR (HCC) 04/21/2020   Essential hypertension 04/21/2020   Alcohol abuse 04/21/2020   ONSET DATE: 09/19/21  REFERRING DIAG: CVA  THERAPY DIAG:  Muscle weakness (generalized)  Other lack of  coordination  Rationale for Evaluation and Treatment: Rehabilitation  SUBJECTIVE:  SUBJECTIVE STATEMENT:  Pt. reports looking forward to spending time with her family on Mother's Day. Pt Pt accompanied by: self, daughter  PERTINENT HISTORY: Pt. is a 68 y.o. 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 Pt. has Right sided weakness, and receptive, and expressive aphasia. Pt. has had a previous CVA with right sided weakness in July 2021. PMHx includes: AFib, Renal Artery thrombosis, situational depression, HTN, Pyelnephritis, Seizure activity, COnstipation, AKI. Vitamin D  deficiency, and urinary incontinence   PRECAUTIONS: None  WEIGHT BEARING RESTRICTIONS: No  PAIN:  Are you having pain? 3/10 FACES tightness/aching pain in the RUE  FALLS: Has patient fallen in last 6 months? Yes. Number of falls 1-ER visit  LIVING ENVIRONMENT: Pt. resides at [facility]. Pt. has very supportive family Stairs: One level Has following equipment at home:  Equipment provided through [employer]  PLOF: Needs assistance with ADLs  PATIENT GOALS:  To improve ROM/flexibility in the RUE 2/2 increased tightness, and to improve ADL functioning in order to towards returning home.  OBJECTIVE:   Note: Objective measures were completed at Evaluation unless otherwise noted.  HAND DOMINANCE: Right  ADLs: Overall ADLs: Assist is provided from the staff at [facility]. Transfers/ambulation related to ADLs: Eating: independent, with set-up using the left hand Grooming: Independent with  set-up using the left hand UB Dressing: Increased assist required, Mod-MaxA doffing jacket LB Dressing: Increased assist required Toileting:  Staff Assists with toileting transfers, and toilet hygiene care. Bathing: Staff assists with bathing Tub Shower transfers: Requires staff assist Equipment: TBD  IADLs: Shopping: N/A Light housekeeping:  Provided by staff Meal Prep: 3 Meals a day provided. Pt. Eats in the main dining room when family visits, otherwise Pt. Eats in her room.  Community mobility: Relies on family for transportation to therapy. Per daughter- Transportation is provided for services not offered at the facility. Medication management: Provided by the facility Financial management: Daughter manages Handwriting: TBD  MOBILITY STATUS: Hx of falls  FUNCTIONAL OUTCOME MEASURES:  UPPER EXTREMITY ROM:    Passive ROM Left Eval AROM WFL Right Eval  Right 01/02/24 Right 02/08/24  Shoulder flexion  0(46) 25 (90) 0(94)  Shoulder abduction  0(64) 0 (90) 0(78)  Shoulder adduction      Shoulder extension      Shoulder internal rotation      Shoulder external rotation   (25) 0(25)  Elbow flexion  0(98) 0(131) 0(133)  Elbow extension  0(full passive) 0 (full passive) 0(full passive)  Wrist flexion  0(56) 0(90) 0(74)  Wrist extension  -56(30) 0 (40) 6(42)  Wrist ulnar deviation      Wrist radial deviation      Wrist pronation      Wrist supination      (Blank rows = not tested)  UPPER EXTREMITY MMT:     MMT Left Eval 5/5 Right eval Right  01/02/24 Right 02/08/24  Shoulder flexion  0/5 2/5  0/5  Shoulder abduction  0/5 1/5 0/5  Shoulder adduction   2-/5 1/5  Shoulder extension      Shoulder internal rotation   1/5 0/5  Shoulder external rotation      Middle trapezius      Lower trapezius      Elbow flexion  0/5 0/5 0/5  Elbow extension  0/5 2/5 1/5  Wrist flexion   1/5   Wrist extension  0/5 3-/5 1/5  Wrist ulnar deviation      Wrist radial deviation      Wrist pronation  0/5 0/5 1/5  Wrist supination  0/5 0/5 0/5  Digit flexors   2-/5 2-/5  Digit extensors   0/5 0/5  (Blank rows = not tested)  HAND FUNCTION: N/A  COORDINATION: N/A  SENSATION: Diminished  EDEMA: No edema noted in the right hand today  MUSCLE TONE: impaired  COGNITION: Overall cognitive status: Aphasia  VISION: Wears  glasses, no change from baseline  PERCEPTION: TBD  PRAXIS: Impaired: Motor planning                                                                                                                           TREATMENT DATE: 02/13/24    Manual therapy:   -Soft tissue massage to the scapular, pectoral, and right shoulder musculature. -  Soft tissue mobilizations to promote radius on ulna motion needed for supination.  -Manual therapy was performed independent of, and in preparation for therapeutic Ex.  Therapeutic Exercise:   -PROM through the following joint ranges of the RUE following moist heat modality, including: shoulder flexion, abduction, ER, horizontal abduction, elbow flexion, extension, forearm supination, wrist extension, digit extension, and thumb abduction, working to increase RUE mobility to manage hemiparetic limb during basic ADLs, as well as reducing contracture risk. Janine Melbourne for R shoulder flex/ext, horizontal  abduction/adduction. -Cues, and hand over hand assist were required for self-ROM HEP for the UE.  Self-care:  -UE dressing using one armed armed dressing technique. -Multiple trials performed  with increased focus on threading the RUE into the sleeve.  PATIENT EDUCATION: Education details: RUE PROM/AAROM, Pt./caregiver education about possible restorative ROM programs at SNF Person educated: Patient Education method: Explanation, Demonstration, Tactile cues, and Verbal cues Education comprehension: verbal cues required, tactile cues required, and needs further education  HOME EXERCISE PROGRAM: RUE self PROM  GOALS: Goals reviewed with patient? Yes  SHORT TERM GOALS: Target date: 01/16/2024    Pt. Will demonstrate supervision with HEPs for BUEs Baseline: Eval: No current HEPs; 01/02/24: Pt demos self PROM techniques throughout the RUE with intermittent min A and mod vc for accuracy 02/08/24: Pt. requires cues for initiation, and hand over hand assist. Goal  status: in progress  LONG TERM GOALS: Target date: 02/07/2024    1.  Pt. Will improve right shoulder flexion to 45* to assist with UE dressing Baseline: Eval: Right: 0(46); 01/02/24: Right: 25 (90) 02/08/24: 0(94) Goal status:  Ongoing  2.  Pt. Will improve with shoulder abduction by 20 degrees to assist with underarm care, and hygiene Baseline: Eval: Right 0(64); 01/02/24: Right 0 (90) 02/08/24: 0(78) Goal status: Ongoing  3.  Pt. Will improve right elbow flexion by 10 degrees to assist with hand to face patterns for grooming Baseline: Eval: Right: 0(98); 01/02/24: Right: 0(131) 02/08/24: 1(610) Goal status: in progress  4.  Pt. Will elicit active wrist extension, and digit extension in preparation for rubbing lotion into the left forearm, and hand  Baseline: Eval: Right wrist: -56 (30); 01/02/24: R wrist: ext: actively to neutral/0 (passively to 40*); R digit extension: no active ext (passively [facility] for this task); unable to self apply lotion to L forearm 02/08/24: right wrist extension: 6(42) Goal status: in progress  5.  Pt. Will perform UE dressing skills with MinA Baseline: Eval: Mod-MaxA doffing a jacket; 01/02/24: Mod A to don/doff a jacket 02/08/24: ModA to thread the right sleeve, minA to bring the shirt around the back of the neck, and donn the left sleeve. ModA to align seams.  Goal status: in progress  6. Pt. Will require minA toileting transfers/ clothing management Baseline: Eval: Pt. requires increased assist from staff; 01/02/24: pt can transfer to standard height toilet with grab bar for support with min A, but requires mod A to transfer up from toilet; Pt consistently performs wc<>BSC transfer with min A, extra time, and vc for sequencing while using hemi walker; total A for clothing management. 02/08/24: MinA, Total assist clothing management  Goal status: in progress  ASSESSMENT:  CLINICAL IMPRESSION:  Pt. presents with limited activation of active muscle responses in the RUE. Pt.  continues to present with increased tone, and tightness in right shoulder flexion, abduction, elbow flexion, wrist, and digit extension with MP flexion tightness limiting full MP extension. Pt. required modA with step-by-step cues for threading the  right sleeve, and mInA for the zipper. Pt. continues to benefit from OT services to normalize tone in the dominant RUE, improve ROM for underarm hygiene care and UE dressing, as well as to improve toileting care, and clothing negotiation tasks.    PERFORMANCE DEFICITS: in functional skills including ADLs, IADLs, coordination, dexterity, proprioception, sensation, ROM, strength, pain, Fine motor control, Gross motor control, balance, body mechanics, decreased knowledge of use of DME, and UE functional use, cognitive skills including problem solving, and psychosocial skills including coping strategies, environmental adaptation, and routines and behaviors.   IMPAIRMENTS: are limiting patient from ADLs, IADLs, and leisure.   CO-MORBIDITIES: may have co-morbidities  that affects occupational performance. Patient will benefit from skilled OT to address above impairments and improve overall function.  MODIFICATION OR ASSISTANCE TO COMPLETE EVALUATION: Min-Moderate modification of tasks or assist with assess necessary to complete an evaluation.  OT OCCUPATIONAL PROFILE AND HISTORY: Detailed assessment: Review of records and additional review of physical, cognitive, psychosocial history related to current functional performance.  CLINICAL DECISION MAKING: Moderate - several treatment options, min-mod task modification necessary  REHAB POTENTIAL: Good  EVALUATION COMPLEXITY: Moderate    PLAN:  OT FREQUENCY: 1x/week  OT DURATION: 12 weeks  PLANNED INTERVENTIONS: 97168 OT Re-evaluation, 97535 self care/ADL training, 16109 therapeutic exercise, 97530 therapeutic activity, 97112 neuromuscular re-education, 97140 manual therapy, 97018 paraffin, 60454 moist  heat, 97010 cryotherapy, 97034 contrast bath, passive range of motion, coping strategies training, patient/family education, and DME and/or AE instructions  RECOMMENDED OTHER SERVICES: PT, ST  CONSULTED AND AGREED WITH PLAN OF CARE: Patient  PLAN FOR NEXT SESSION: see above  Duey Ghent, MS, OTR/L   02/13/2024, 11:57 AM

## 2024-02-13 NOTE — Therapy (Signed)
 OUTPATIENT PHYSICAL THERAPY NEURO TREATMENT /  PHYSICAL THERAPY PROGRESS NOTE   Dates of reporting period  12/28/23   to   02/13/24    Patient Name: Lindsey [name] MRN: [id number] DOB:1956-03-17, 68 y.o., female Today's Date: 02/13/2024   PCP: Lorina Roosevelt, MD  REFERRING PROVIDER: Liam Redhead, MD   END OF SESSION:   PT End of Session - 02/13/24 0850     Visit Number 20    Number of Visits 34    Date for PT Re-Evaluation 04/02/24    Progress Note Due on Visit 20    PT Start Time 0849    PT Stop Time 0930    PT Time Calculation (min) 41 min    Equipment Utilized During Treatment Gait belt   custom R LE AFO   Activity Tolerance Patient tolerated treatment well    Behavior During Therapy WFL for tasks assessed/performed                Past Medical History:  Diagnosis Date   Aphasia    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   REFERRING DIAG:  Diagnosis  I69.398,R25.2 (ICD-10-CM) - Spasticity as late effect of cerebrovascular accident (CVA)    THERAPY DIAG:  Muscle weakness (generalized)  Apraxia  Other lack of coordination  Unsteadiness on feet  Difficulty in walking, not elsewhere classified  Abnormality of gait and mobility  Other abnormalities of gait and mobility  Rationale for Evaluation and Treatment: Rehabilitation  SUBJECTIVE:  SUBJECTIVE STATEMENT:  Pt with aphasia, responding primarily to yes/no questions. Pt reports no pain. No new updates. Feels that she is moving betting.    From EVAL: Daughter present for Evaluation to provide hx. Pt is familiar to this clinic and has been seen for multiple bouts PT since initial CVA in 2021. Pt d/c'ed from PT serviced in April of 2024 with plans to receive PT services through [facility]. Daughter reports that she was only receiving minimal therapy in facility. Reports at least 1 fall in the last 6 months and daughter asked staff to transfer pt with lift since fall.  Daughter would want pt to demonstrate improve safety with transfers to allow bil transfers with reduced use of rail to allow pt to return home for assisted living.  Unable to discern whether pt in SNF or assisted living care from hx.    Pt accompanied by: self   PERTINENT HISTORY:  1. Acute Ischemic Left MCA Stroke:Right Hemiplegia: Ordered outpatient OT, PT, and SLP. She has had a HFU appointment with Neurology. Discussed that I can provide new scripts next month if needed. Communicated with her providers regarding positive reinforcement. She has made excellent gains  in strength as well as cognition and speech!   -Provided with new disability placard.    2. Essential Hypertension: BP is currently very elevated and discussed with patient that it would be unsafe for her to receive injections today as the injections can temporarily increase her pain, which can increase her blood pressure, and put her at risk for stroke. Discussed starting the medication Valsartan  40mg  daily and logging blood pressures daily at home and she is agreeable. Medications reviewed and she is taking Amlodipine  5mg  and lopressor .  Provided with a list of foods that will help lower her BP.  BP reviewed and improved at 136/82 -continue clonidine -Advised checking BP daily at home and logging results to bring into follow-up appointment with PCP and myself. -Reviewed BP meds today.  -Advised regarding healthy foods that can help lower blood pressure and provided with a list:  PAIN:  Are you having pain? No  PRECAUTIONS: Fall  RED FLAGS: None   WEIGHT BEARING RESTRICTIONS: No  FALLS: Has patient fallen in last 6 months? Yes. Number of falls 1  LIVING ENVIRONMENT: Lives with: lives with their family and lives in a skilled nursing facility Lives in: Other SNF  Stairs: No Has following equipment at home: Hemi walker and Wheelchair (manual)  PLOF: Requires assistive device for independence, Needs assistance with ADLs, Needs assistance with homemaking, and WC level currently   PATIENT GOALS: move better - walking or transferring with hemi walking   OBJECTIVE:  Note: Objective measures were completed at Evaluation unless otherwise noted.  DIAGNOSTIC FINDINGS:  Head CT:  IMPRESSION: 1. No acute intracranial abnormality. 2. No acute displaced fracture or traumatic listhesis of the cervical spine.  Cervical CT:  MPRESSION: 1. No acute intracranial abnormality. 2. No acute displaced fracture or traumatic listhesis of the cervical spine.  COGNITION: Overall cognitive  status: History of cognitive impairments - at baseline   SENSATION: Light touch: Impaired  Proprioception: Impaired  Able to detect depe pressure   COORDINATION: Increased tone on the RLE   EDEMA:  Mild RLE distal edema   MUSCLE TONE: RLE: Mild and Moderate  MUSCLE LENGTH: Hamstrings: grossly limited    POSTURE: rounded shoulders, forward head, and posterior bias   LOWER EXTREMITY ROM:     Active  Right Eval Left Eval  Hip flexion  Limited to 5 deg beyond 90 in sitting  Knee flexion  90deg in sitting  Knee extension  Lacking 10 deg full extension  Ankle dorsiflexion  none  Ankle plantarflexion  none   (Blank rows = not tested)  LOWER EXTREMITY MMT:    MMT Right Eval Left Eval  Hip flexion  2+  Hip extension    Hip abduction  2  Hip adduction  3  Hip internal rotation    Hip external rotation    Knee flexion  2+  Knee extension  3+  Ankle dorsiflexion  0  Ankle plantarflexion  0  Ankle inversion    Ankle eversion    (Blank rows = not tested)  BED MOBILITY:  Sit to supine Mod A Supine to sit Mod A Rolling to Right Min A Rolling to Left Min A  TRANSFERS: Assistive device utilized: Hemi walker  Sit to stand: Mod A Stand to sit: Mod A Chair to chair: Mod A Floor: unable to perform     CURB:  Level of Assistance: Total A Assistive device utilized: rail Curb Comments: unable to perform   STAIRS: Unable to perform at eval   GAIT: Gait pattern: non-functional constant posterior bias , step to pattern, decreased stance time- Right, Right hip hike, lateral hip instability, and lateral lean- Left Distance walked: 27ft Assistive device utilized: Hemi walker Level of assistance: Mod A Comments: moderate cues for sequencing of HW and gait pattern in turns   FUNCTIONAL TESTS:  5 times sit to stand: 48 sec with mod Assist from PT  Timed up and go (TUG): unable to perform  10 meter walk test: 6 ft with min-mod assist and HW.  FIST:  Function In  Sitting Test (FIST)  (1/2 femur on surface; hips/knees flexed to 90deg)   - indicate bed or mat table / step stool if used  SCORING KEY: 4 = Independent (completes task independently & successfully) 3 = Verbal Cues/Increased Time (completes task independently & successfully and only needs more time/cues) 2 = Upper Extremity Support (must use UE for support or assistance to complete successfully) 1 = Needs Assistance (unable to complete w/o physical assist; DOCUMENT LEVEL: min, mod, max) 0 = Dependent (requires complete physical assist; unable to complete successfully even w/ physical assist)  Randomly Administer Once Throughout Exam  4 - Anterior Nudge (superior sternum)  4 - Posterior Nudge (between scapular spines)  4 - Lateral Nudge (to dominant side at acromion)     4 - Static sitting (30 seconds)  4 - Sitting, shake 'no' (left and right)  4 - Sitting, eyes closed (30 seconds)   3 - Sitting, lift foot (dominant side, lift foot 1 inch twice)    4 - Pick up object from behind (object at midline, hands breadth posterior)  1 mod assist - Forward reach (use dominant arm, must complete full motion) 1 mod assist - Lateral reach (use dominant arm, clear opposite ischial tuberosity) 1 mod assist  - Pick up object from floor (from between feet)   1 mod assist - Posterior scooting (move backwards 2 inches)  1 min assist  - Anterior scooting (move forward 2 inches)  1 min assist  - Lateral scooting (move to dominant side 2 inches)    TOTAL = 37/56  Notes/comments: apraxia limiting success of scooting and reacting tasks.  MCD > 5 points MCID for IP REHAB > 6 points      PATIENT SURVEYS:  To be completed  TREATMENT DATE: 02/13/2024  PT instructed pt in goal assessment.   Function In Sitting Test (FIST)  (1/2 femur on surface; hips/knees flexed to 90deg)    - indicate bed or mat table / step stool if used  SCORING KEY: 4 = Independent (completes task independently & successfully) 3 = Verbal Cues/Increased Time (completes task independently & successfully and only needs more time/cues) 2 = Upper Extremity Support (must use UE for support or assistance to complete successfully) 1 = Needs Assistance (unable to complete w/o physical assist; DOCUMENT LEVEL: min, mod, max) 0 = Dependent (requires complete physical assist; unable to complete successfully even w/ physical assist)  Randomly Administer Once Throughout Exam  4 - Anterior Nudge (superior sternum)  4 - Posterior Nudge (between scapular spines)  4 - Lateral Nudge (to dominant side at acromion)     4 - Static sitting (30 seconds)  4 - Sitting, shake 'no' (left and right)  4 - Sitting, eyes closed (30 seconds)   4 - Sitting, lift foot (dominant side, lift foot 1 inch twice)    4 - Pick up object from behind (object at midline, hands breadth posterior)  4 - Forward reach (use dominant arm, must complete full motion) 3 - Lateral reach (use dominant arm, clear opposite ischial tuberosity) 2 - Pick up object from floor (from between feet)   1 - Posterior scooting (move backwards 2 inches)  2 - Anterior scooting (move forward 2 inches)  2 - Lateral scooting (move to dominant side 2 inches)    TOTAL = 46/56  Notes/comments: min assist only with posterior scooting on this day  MCD > 5 points MCID for IP REHAB > 6 points   Pt performed 5 time sit<>stand (5xSTS): 28.8 sec (>15 sec indicates increased fall risk) min assist for full erect standing on each bout. Cues for proper UE placement, but improved consistency of LUE pushing from arm rest  10 Meter Walk Test: Patient instructed to walk 10 meters (32.8 ft) as quickly and as safely as possible at their normal speed x2 and at a fast speed x2. Time measured from 2 meter mark to 8 meter mark to accommodate ramp-up and ramp-down.    Average Normal speed: 2.55min  (0.023m/s) with HW and min-mod assist  Cut off scores: <0.4 m/s = household Ambulator, 0.4-0.8 m/s = limited community Ambulator, >0.8 m/s = community Ambulator, >1.2 m/s = crossing a street, <1.0 = increased fall risk MCID 0.05 m/s (small), 0.13 m/s (moderate), 0.06 m/s (significant)  (ANPTA Core Set of Outcome Measures for Adults with Neurologic Conditions, 2018)   PATIENT EDUCATION: Education details: POC.  Pt educated throughout session about proper posture and technique with exercises. Improved exercise technique, movement at target joints, use of target muscles after min to mod verbal, visual, tactile cues.   Person educated: Patient and Child(ren) Education method: Explanation Education comprehension: verbalized understanding  HOME EXERCISE PROGRAM: Access Code: [email or web address] URL: [email or web address] Date: 12/05/2023 Prepared by: Aurora Lees  Exercises - Sit to Stand with Counter Support  - 1 x daily - 3-4 x weekly - 2 sets - 10 reps - Seated Long Arc Quad  - 1 x daily - 3-4 x weekly - 2 sets - 10 reps - 2 hold - Seated March  - 1 x daily - 3-4 x weekly - 2 sets - 10 reps - 2 hold - Standing Hip Abduction with Counter Support  - 1 x daily - 7 x weekly - 3 sets - 5  reps - Wide Stance with Counter Support  - 1 x daily - 7 x weekly - 3 sets - 2 reps - 30 hold - Seated Hip Abduction  - 1 x daily - 7 x weekly - 3 sets - 10 reps - Supine Bridge  - 1 x daily - 7 x weekly - 3 sets - 10 reps - Supine Short Arc Quad  - 1 x daily - 7 x weekly - 3 sets - 10 reps - Bent Knee Fallouts  - 1 x daily - 7 x weekly - 3 sets - 10 reps - Small Range Straight Leg Raise  - 1 x daily - 7 x weekly - 3 sets - 10 reps - Supine Heel Slide  - 1 x daily - 7 x weekly - 3 sets - 10 reps   GOALS: Goals reviewed with patient? Yes  SHORT TERM GOALS: Target date: 12/13/2023    Patient will be independent in home exercise program to improve strength/mobility  for better functional independence with ADLs. Baseline: to be provided on visit 2: 12/28/2023- No caregiver with patient to determine if compliant with HEP Goal  5/13: daugher states that they are completing some exercises almost daily  status: IN PROGRESS   LONG TERM GOALS: Target date: 04/02/2024       2.  Patient (> 63 years old) will complete five times sit to stand test by 15 seconds and only min assist indicating an increased LE strength and improved balance. Baseline: 48 with mod assist from PT; 12/28/2023= 44 sec from EOM at lowest postion (approx 21 in) with Left UE support and CGA to min A from PT 5/13: 28.80 sec with HW from [facility]; min assist from PT for full erect standing Goal status: Progressing  3.  Patient will increase FIST score by > 6 points to demonstrate decreased fall risk during functional activities Baseline: 37 5/13: 46/56 Goal status: MET  4.  Patient will complete 10 meter walk test to with HW in less than 1 minute with min assist as to improve gait speed for better community ambulation and to reduce fall risk. Baseline: 6 ft with HW and mod assist; 12/28/2023=18 feet with HW- mod assist  5/13: 2:11 min with HW, min-mod assist.  Goal status: PROGRESSING  5.  Patient will perform stand pivot transfer to and from [facility] with HW and CGA for safety to allow safe transfer to toilet with daughter without use of rail on the wall. Baseline: mod assist and heavy use of rail. 12/28/2023= Min to Mod assist with repetitive VC for technique 5/13: min assist overall with HW; mod assist initially both directions  to prevent posterior LOB. Goal status: Progressing  6.  Patient will improve bed mobility to min assist only to reduce care giver burden and improve safety of transfers  Baseline: mod-max assist 5/13: Min-mod assist for sit<>supine. Min assist only for RLE management for rolling Goal status: INITIAL  ASSESSMENT:  CLINICAL IMPRESSION:  Patient presents with good  motivation for therapy appointment.  PT treatment focused on  goals assessment for progress note. Pt has noted to have made significant progress with gait distance, bed mobility and sitting balance. Improved Fist by 9 points (greater than MCID 5points) as well as ability to complete with HW and min-mod assist. Continues to require intermittent min/mod assist from PT for safety with stand pivot transfers duet to mild posterior bias. Patient's condition has the potential to improve in response to therapy. Maximum improvement is  yet to be obtained. The anticipated improvement is attainable and reasonable in a generally predictable time.  Ms. Boomsma will benefit from continued skilled PT to address strength and balance deficits to improve safety for transfers, reduce care giver burden, and allow return to PLOF and possible return home from Assisted living.    OBJECTIVE IMPAIRMENTS: Abnormal gait, cardiopulmonary status limiting activity, decreased activity tolerance, decreased balance, decreased cognition, decreased coordination, decreased endurance, decreased knowledge of use of DME, decreased mobility, difficulty walking, decreased ROM, decreased strength, hypomobility, increased muscle spasms, impaired flexibility, impaired sensation, impaired tone, impaired UE functional use, improper body mechanics, and postural dysfunction.   ACTIVITY LIMITATIONS: carrying, lifting, bending, standing, squatting, stairs, transfers, bed mobility, continence, bathing, toileting, dressing, reach over head, hygiene/grooming, and locomotion level  PARTICIPATION LIMITATIONS: cleaning, interpersonal relationship, shopping, and community activity  PERSONAL FACTORS: 3+ comorbidities: HTN, chronic CVA, Afib, Lupus  are also affecting patient's functional outcome.   REHAB POTENTIAL: Fair chronic CVA with poor progress in prior interventions with this clinic  CLINICAL DECISION MAKING: Unstable/unpredictable  EVALUATION  COMPLEXITY: High  PLAN:  PT FREQUENCY: 1-2x/week  PT DURATION: 12 weeks  PLANNED INTERVENTIONS: 97110-Therapeutic exercises, 97530- Therapeutic activity, [id number]- Neuromuscular re-education, 97535- Self Care, 16109- Manual therapy, [phone]- Gait training, [phone]- Orthotic Fit/training, [phone]- Aquatic Therapy, [phone]- Splinting, [id number]- Electrical stimulation (unattended), [phone]- Electrical stimulation (manual), Patient/Family education, Balance training, Stair training, Joint mobilization, Vestibular training, Visual/preceptual remediation/compensation, Cognitive remediation, DME instructions, Wheelchair mobility training, Cryotherapy, and Moist heat  PLAN FOR NEXT SESSION:   -  R glute activation and strength during standing  Continue  standing balance.  Gait.  Transfers with decreased UE support and awareness of posterior LOB.    Aurora Lees PT, DPT  Physical Therapist - [facility]  8:51 AM 02/13/24

## 2024-02-15 ENCOUNTER — Ambulatory Visit: Payer: Medicare Other | Admitting: Physical Therapy

## 2024-02-15 ENCOUNTER — Ambulatory Visit: Payer: Medicare Other | Admitting: Occupational Therapy

## 2024-02-15 ENCOUNTER — Telehealth: Payer: Self-pay | Admitting: Physical Therapy

## 2024-02-15 ENCOUNTER — Ambulatory Visit: Payer: Medicare Other | Admitting: Speech Pathology

## 2024-02-15 DIAGNOSIS — R278 Other lack of coordination: Secondary | ICD-10-CM

## 2024-02-15 DIAGNOSIS — R4701 Aphasia: Secondary | ICD-10-CM | POA: Diagnosis not present

## 2024-02-15 DIAGNOSIS — R269 Unspecified abnormalities of gait and mobility: Secondary | ICD-10-CM

## 2024-02-15 DIAGNOSIS — R482 Apraxia: Secondary | ICD-10-CM

## 2024-02-15 DIAGNOSIS — R2681 Unsteadiness on feet: Secondary | ICD-10-CM

## 2024-02-15 DIAGNOSIS — R262 Difficulty in walking, not elsewhere classified: Secondary | ICD-10-CM

## 2024-02-15 DIAGNOSIS — M6281 Muscle weakness (generalized): Secondary | ICD-10-CM

## 2024-02-15 DIAGNOSIS — R2689 Other abnormalities of gait and mobility: Secondary | ICD-10-CM

## 2024-02-15 NOTE — Telephone Encounter (Signed)
 Pt contacted via telephone and author left voice mail informing of missed appointment and informed pt of future PT appointment date and time.   Aurora Lees PT, DPT  Physical Therapist - [facility]  9:05 AM 02/15/24

## 2024-02-15 NOTE — Therapy (Signed)
 OUTPATIENT PHYSICAL THERAPY NEURO TREATMENT    Patient Name: Lindsey [name] MRN: [id number] DOB:[birth year], 68 y.o., female Today's Date: 02/15/2024   PCP: Lorina Roosevelt, MD  REFERRING PROVIDER: Liam Redhead, MD   END OF SESSION:   PT End of Session - 02/15/24 0918     Visit Number 21    Number of Visits 34    Date for PT Re-Evaluation 04/02/24    Progress Note Due on Visit 20    PT Start Time 0917    PT Stop Time 1000    PT Time Calculation (min) 43 min    Equipment Utilized During Treatment Gait belt   custom R LE AFO   Activity Tolerance Patient tolerated treatment well    Behavior During Therapy WFL for tasks assessed/performed                Past Medical History:  Diagnosis Date   Aphasia    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   REFERRING DIAG:  Diagnosis  I69.398,R25.2 (ICD-10-CM) - Spasticity as late effect of cerebrovascular accident (CVA)    THERAPY DIAG:  Muscle weakness (generalized)  Apraxia  Other lack of coordination  Unsteadiness on feet  Difficulty in walking, not elsewhere classified  Abnormality of gait and mobility  Other abnormalities of gait and mobility  Rationale for Evaluation and Treatment: Rehabilitation  SUBJECTIVE:  SUBJECTIVE STATEMENT:  Arrives late to scheduled PT, was able to be seen due to cancellation. Pt with aphasia, responding primarily to yes/no questions. Pt reports some pain today, but difficulty identifying verbally to PT, states "the middle" . No new updates.   From EVAL: Daughter present for Evaluation to provide hx. Pt is familiar to this clinic and has been seen for multiple bouts PT since initial CVA in 2021. Pt d/c'ed from PT serviced in April of 2024 with plans to receive PT services through [facility]. Daughter reports that she was only receiving minimal therapy in facility. Reports at least 1 fall in the last 6 months and daughter asked staff to transfer pt with lift since fall.  Daughter would want pt to demonstrate improve safety with transfers to allow bil transfers with reduced use of rail to allow pt to return home for assisted living.  Unable to discern whether pt in SNF or assisted living care from hx.    Pt accompanied by: self   PERTINENT HISTORY:  1. Acute Ischemic Left MCA Stroke:Right Hemiplegia: Ordered outpatient OT, PT, and SLP. She has had a HFU appointment with Neurology. Discussed that I can provide new scripts next month if needed. Communicated with her providers regarding positive reinforcement. She has made  excellent gains in strength as well as cognition and speech!   -Provided with new disability placard.    2. Essential Hypertension: BP is currently very elevated and discussed with patient that it would be unsafe for her to receive injections today as the injections can temporarily increase her pain, which can increase her blood pressure, and put her at risk for stroke. Discussed starting the medication Valsartan  40mg  daily and logging blood pressures daily at home and she is agreeable. Medications reviewed and she is taking Amlodipine  5mg  and lopressor .  Provided with a list of foods that will help lower her BP.  BP reviewed and improved at 136/82 -continue clonidine -Advised checking BP daily at home and logging results to bring into follow-up appointment with PCP and myself. -Reviewed BP meds today.  -Advised regarding healthy foods that can help lower blood pressure and provided with a list:  PAIN:  Are you having pain? No  PRECAUTIONS: Fall  RED FLAGS: None   WEIGHT BEARING RESTRICTIONS: No  FALLS: Has patient fallen in last 6 months? Yes. Number of falls 1  LIVING ENVIRONMENT: Lives with: lives with their family and lives in a skilled nursing facility Lives in: Other SNF  Stairs: No Has following equipment at home: Hemi walker and Wheelchair (manual)  PLOF: Requires assistive device for independence, Needs assistance with ADLs, Needs assistance with homemaking, and WC level currently   PATIENT GOALS: move better - walking or transferring with hemi walking   OBJECTIVE:  Note: Objective measures were completed at Evaluation unless otherwise noted.  DIAGNOSTIC FINDINGS:  Head CT:  IMPRESSION: 1. No acute intracranial abnormality. 2. No acute displaced fracture or traumatic listhesis of the cervical spine.  Cervical CT:  MPRESSION: 1. No acute intracranial abnormality. 2. No acute displaced fracture or traumatic listhesis of the cervical spine.  COGNITION: Overall  cognitive status: History of cognitive impairments - at baseline   SENSATION: Light touch: Impaired  Proprioception: Impaired  Able to detect depe pressure   COORDINATION: Increased tone on the RLE   EDEMA:  Mild RLE distal edema   MUSCLE TONE: RLE: Mild and Moderate  MUSCLE LENGTH: Hamstrings: grossly limited    POSTURE: rounded shoulders, forward head, and  posterior bias   LOWER EXTREMITY ROM:     Active  Right Eval Left Eval  Hip flexion  Limited to 5 deg beyond 90 in sitting  Knee flexion  90deg in sitting  Knee extension  Lacking 10 deg full extension  Ankle dorsiflexion  none  Ankle plantarflexion  none   (Blank rows = not tested)  LOWER EXTREMITY MMT:    MMT Right Eval Left Eval  Hip flexion  2+  Hip extension    Hip abduction  2  Hip adduction  3  Hip internal rotation    Hip external rotation    Knee flexion  2+  Knee extension  3+  Ankle dorsiflexion  0  Ankle plantarflexion  0  Ankle inversion    Ankle eversion    (Blank rows = not tested)  BED MOBILITY:  Sit to supine Mod A Supine to sit Mod A Rolling to Right Min A Rolling to Left Min A  TRANSFERS: Assistive device utilized: Hemi walker  Sit to stand: Mod A Stand to sit: Mod A Chair to chair: Mod A Floor: unable to perform     CURB:  Level of Assistance: Total A Assistive device utilized: rail Curb Comments: unable to perform   STAIRS: Unable to perform at eval   GAIT: Gait pattern: non-functional constant posterior bias , step to pattern, decreased stance time- Right, Right hip hike, lateral hip instability, and lateral lean- Left Distance walked: 42ft Assistive device utilized: Hemi walker Level of assistance: Mod A Comments: moderate cues for sequencing of HW and gait pattern in turns   FUNCTIONAL TESTS:  5 times sit to stand: 48 sec with mod Assist from PT  Timed up and go (TUG): unable to perform  10 meter walk test: 6 ft with min-mod assist and HW.  FIST:   Function In Sitting Test (FIST)  (1/2 femur on surface; hips/knees flexed to 90deg)   - indicate bed or mat table / step stool if used  SCORING KEY: 4 = Independent (completes task independently & successfully) 3 = Verbal Cues/Increased Time (completes task independently & successfully and only needs more time/cues) 2 = Upper Extremity Support (must use UE for support or assistance to complete successfully) 1 = Needs Assistance (unable to complete w/o physical assist; DOCUMENT LEVEL: min, mod, max) 0 = Dependent (requires complete physical assist; unable to complete successfully even w/ physical assist)  Randomly Administer Once Throughout Exam  4 - Anterior Nudge (superior sternum)  4 - Posterior Nudge (between scapular spines)  4 - Lateral Nudge (to dominant side at acromion)     4 - Static sitting (30 seconds)  4 - Sitting, shake 'no' (left and right)  4 - Sitting, eyes closed (30 seconds)   3 - Sitting, lift foot (dominant side, lift foot 1 inch twice)    4 - Pick up object from behind (object at midline, hands breadth posterior)  1 mod assist - Forward reach (use dominant arm, must complete full motion) 1 mod assist - Lateral reach (use dominant arm, clear opposite ischial tuberosity) 1 mod assist  - Pick up object from floor (from between feet)   1 mod assist - Posterior scooting (move backwards 2 inches)  1 min assist  - Anterior scooting (move forward 2 inches)  1 min assist  - Lateral scooting (move to dominant side 2 inches)    11/21/23 TOTAL = 37/56  02/13/24 TOTAL = 46/56   Notes/comments: apraxia limiting success of scooting and  reacting tasks.  MCD > 5 points MCID for IP REHAB > 6 points      PATIENT SURVEYS:  To be completed                                                                                                                               TREATMENT DATE: 02/15/2024  Stand pivot transfer to arm chair with mod assist due to posterior LOB.    Sit<>stand x 5 with UE support on RW for first 2 reps with mod assist and instruction to push from arm rest for th last 3 reps with min/CGA  Reciprocal forward step x 10 bil with UE supported on HW  Forward/reverse gait with HE 62ft x 3 for 2 bouts min assist for forward progression, min-mod assist for reverse and cues for improved posture and weight shift to the L to allow advancement of the RLE.   Side stepping R and L with HW 73ft R and L x 2   Stand pivot to face rail with min-mod assist. Reciprocal march with mod assist to increase hip/knee flexion on the RLE x 8 bil; UE supported on rail.  Hip abduction with RLE x 6 with Ue support on rail. Apraxia and extensor tone limited increased repitions for hip abduction   PATIENT EDUCATION: Education details: POC.  Pt educated throughout session about proper posture and technique with exercises. Improved exercise technique, movement at target joints, use of target muscles after min to mod verbal, visual, tactile cues.  Improved posture to  allow improved advancement of RLE in all planes. Difficulty   Person educated: Patient and Child(ren) Education method: Explanation Education comprehension: verbalized understanding  HOME EXERCISE PROGRAM: Access Code: [email or web address] URL: [email or web address] Date: 12/05/2023 Prepared by: Aurora Lees  Exercises - Sit to Stand with Counter Support  - 1 x daily - 3-4 x weekly - 2 sets - 10 reps - Seated Long Arc Quad  - 1 x daily - 3-4 x weekly - 2 sets - 10 reps - 2 hold - Seated March  - 1 x daily - 3-4 x weekly - 2 sets - 10 reps - 2 hold - Standing Hip Abduction with Counter Support  - 1 x daily - 7 x weekly - 3 sets - 5 reps - Wide Stance with Counter Support  - 1 x daily - 7 x weekly - 3 sets - 2 reps - 30 hold - Seated Hip Abduction  - 1 x daily - 7 x weekly - 3 sets - 10 reps - Supine Bridge  - 1 x daily - 7 x weekly - 3 sets - 10 reps - Supine Short Arc Quad  - 1 x daily - 7 x  weekly - 3 sets - 10 reps - Bent Knee Fallouts  - 1 x daily - 7 x weekly - 3 sets - 10 reps - Small Range Straight Leg Raise  - 1 x daily - 7  x weekly - 3 sets - 10 reps - Supine Heel Slide  - 1 x daily - 7 x weekly - 3 sets - 10 reps   GOALS: Goals reviewed with patient? Yes  SHORT TERM GOALS: Target date: 12/13/2023    Patient will be independent in home exercise program to improve strength/mobility for better functional independence with ADLs. Baseline: to be provided on visit 2: 12/28/2023- No caregiver with patient to determine if compliant with HEP Goal  5/13: daugher states that they are completing some exercises almost daily  status: IN PROGRESS   LONG TERM GOALS: Target date: 04/02/2024       2.  Patient (> 84 years old) will complete five times sit to stand test by 15 seconds and only min assist indicating an increased LE strength and improved balance. Baseline: 48 with mod assist from PT; 12/28/2023= 44 sec from EOM at lowest postion (approx 21 in) with Left UE support and CGA to min A from PT 5/13: 28.80 sec with HW from [facility]; min assist from PT for full erect standing Goal status: Progressing  3.  Patient will increase FIST score by > 6 points to demonstrate decreased fall risk during functional activities Baseline: 37 5/13: 46/56 Goal status: MET  4.  Patient will complete 10 meter walk test to with HW in less than 1 minute with min assist as to improve gait speed for better community ambulation and to reduce fall risk. Baseline: 6 ft with HW and mod assist; 12/28/2023=18 feet with HW- mod assist  5/13: 2:11 min with HW, min-mod assist.  Goal status: PROGRESSING  5.  Patient will perform stand pivot transfer to and from [facility] with HW and CGA for safety to allow safe transfer to toilet with daughter without use of rail on the wall. Baseline: mod assist and heavy use of rail. 12/28/2023= Min to Mod assist with repetitive VC for technique 5/13: min assist overall with HW;  mod assist initially both directions  to prevent posterior LOB. Goal status: Progressing  6.  Patient will improve bed mobility to min assist only to reduce care giver burden and improve safety of transfers  Baseline: mod-max assist 5/13: Min-mod assist for sit<>supine. Min assist only for RLE management for rolling Goal status: INITIAL  ASSESSMENT:  CLINICAL IMPRESSION:  Patient presents with good motivation for therapy appointment.  Despite reporting mild pain "in the middle". PT treatment focused on  improved gait mechanics in all planes as well as improved tolerance to standing. Difficulty with high repetitions of side stepping or hip abduction due to extensor tone and and apraxia limiting activation of abductors. Provided note for patient that transfers to [facility] and Rail would be safe with min assist from staff or family.  Ms. Kump will benefit from continued skilled PT to address strength and balance deficits to improve safety for transfers, reduce care giver burden, and allow return to PLOF and possible return home from Assisted living.    OBJECTIVE IMPAIRMENTS: Abnormal gait, cardiopulmonary status limiting activity, decreased activity tolerance, decreased balance, decreased cognition, decreased coordination, decreased endurance, decreased knowledge of use of DME, decreased mobility, difficulty walking, decreased ROM, decreased strength, hypomobility, increased muscle spasms, impaired flexibility, impaired sensation, impaired tone, impaired UE functional use, improper body mechanics, and postural dysfunction.   ACTIVITY LIMITATIONS: carrying, lifting, bending, standing, squatting, stairs, transfers, bed mobility, continence, bathing, toileting, dressing, reach over head, hygiene/grooming, and locomotion level  PARTICIPATION LIMITATIONS: cleaning, interpersonal relationship, shopping, and community activity  PERSONAL FACTORS:  3+ comorbidities: HTN, chronic CVA, Afib, Lupus  are also  affecting patient's functional outcome.   REHAB POTENTIAL: Fair chronic CVA with poor progress in prior interventions with this clinic  CLINICAL DECISION MAKING: Unstable/unpredictable  EVALUATION COMPLEXITY: High  PLAN:  PT FREQUENCY: 1-2x/week  PT DURATION: 12 weeks  PLANNED INTERVENTIONS: 97110-Therapeutic exercises, 97530- Therapeutic activity, [id number]- Neuromuscular re-education, 97535- Self Care, 16109- Manual therapy, [phone]- Gait training, [phone]- Orthotic Fit/training, [phone]- Aquatic Therapy, [phone]- Splinting, 97014- Electrical stimulation (unattended), [phone]- Electrical stimulation (manual), Patient/Family education, Balance training, Stair training, Joint mobilization, Vestibular training, Visual/preceptual remediation/compensation, Cognitive remediation, DME instructions, Wheelchair mobility training, Cryotherapy, and Moist heat  PLAN FOR NEXT SESSION:   -  R glute activation and strength during standing  Continue  standing balance.  Gait.  Transfers with decreased UE support and awareness of posterior LOB.    Aurora Lees PT, DPT  Physical Therapist - [place]  [facility]  9:24 AM 02/15/24

## 2024-02-20 ENCOUNTER — Ambulatory Visit: Payer: Medicare Other | Admitting: Physical Therapy

## 2024-02-20 ENCOUNTER — Ambulatory Visit: Payer: Medicare Other | Admitting: Occupational Therapy

## 2024-02-20 ENCOUNTER — Ambulatory Visit: Payer: Medicare Other | Admitting: Speech Pathology

## 2024-02-20 DIAGNOSIS — M6281 Muscle weakness (generalized): Secondary | ICD-10-CM

## 2024-02-20 DIAGNOSIS — R262 Difficulty in walking, not elsewhere classified: Secondary | ICD-10-CM

## 2024-02-20 DIAGNOSIS — R4701 Aphasia: Secondary | ICD-10-CM | POA: Diagnosis not present

## 2024-02-20 DIAGNOSIS — R278 Other lack of coordination: Secondary | ICD-10-CM

## 2024-02-20 DIAGNOSIS — R482 Apraxia: Secondary | ICD-10-CM

## 2024-02-20 DIAGNOSIS — R269 Unspecified abnormalities of gait and mobility: Secondary | ICD-10-CM

## 2024-02-20 DIAGNOSIS — R2681 Unsteadiness on feet: Secondary | ICD-10-CM

## 2024-02-20 DIAGNOSIS — R2689 Other abnormalities of gait and mobility: Secondary | ICD-10-CM

## 2024-02-20 NOTE — Therapy (Signed)
 Occupational Therapy Progress Note  Dates of reporting period  01/02/24   to   02/20/24    Patient Name: Lindsey [name] MRN: [id number] DOB:25-May-1956, 68 y.o., female Today's Date: 02/20/2024  PCP: Lorina Roosevelt, MD REFERRING PROVIDER: Laverle Postin, MD    OT End of Session - 02/20/24 1253     Visit Number 20    Number of Visits 48    Date for OT Re-Evaluation 05/02/24    OT Start Time 0930    OT Stop Time 1015    OT Time Calculation (min) 45 min    Activity Tolerance Patient tolerated treatment well    Behavior During Therapy WFL for tasks assessed/performed              Past Medical History:  Diagnosis Date   Aphasia    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) 09/24/2021   Cerebral embolism with cerebral infarction 09/23/2021   Pyelonephritis 09/19/2021   Pain due to onychomycosis of toenails of both feet 11/19/2020   Normocytic anemia 05/31/2020   Acute ischemic left MCA stroke (HCC) 04/30/2020   Right hemiplegia (HCC) 04/30/2020   Cerebrovascular accident (HCC) 04/21/2020   Atrial fibrillation with RVR (HCC) 04/21/2020   Essential hypertension 04/21/2020   Alcohol abuse 04/21/2020   ONSET DATE: 09/19/21  REFERRING DIAG: CVA  THERAPY DIAG:  Muscle weakness  (generalized)  Rationale for Evaluation and Treatment: Rehabilitation  SUBJECTIVE:  SUBJECTIVE STATEMENT:  Pt. reports doing okay today Pt Pt accompanied by: self, daughter  PERTINENT HISTORY: Pt. is a 68 y.o. 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 Pt. has Right sided weakness, and receptive, and expressive aphasia. Pt. has had a previous CVA with right sided weakness in July 2021. PMHx includes: AFib, Renal Artery thrombosis, situational depression, HTN, Pyelnephritis, Seizure activity, COnstipation, AKI. Vitamin D  deficiency, and urinary incontinence   PRECAUTIONS: None  WEIGHT BEARING RESTRICTIONS: No  PAIN:  Are you having pain? 2/10 FACES tightness/aching pain in the RUE  FALLS: Has patient fallen in last 6 months? Yes. Number of falls 1-ER visit  LIVING ENVIRONMENT: Pt. resides at [facility]. Pt. has very supportive family Stairs: One level Has following equipment at home:  Equipment provided through [employer]  PLOF: Needs assistance with ADLs  PATIENT GOALS:  To improve ROM/flexibility in the RUE 2/2 increased tightness, and to improve ADL functioning in order to towards returning home.  OBJECTIVE:   Note: Objective measures were completed at Evaluation unless otherwise noted.  HAND DOMINANCE: Right  ADLs: Overall ADLs: Assist is provided from the staff at [facility]. Transfers/ambulation related to ADLs: Eating: independent, with set-up using the left hand Grooming: Independent with set-up  using the left hand UB Dressing: Increased assist required, Mod-MaxA doffing jacket LB Dressing: Increased assist required Toileting:  Staff Assists with toileting transfers, and toilet hygiene care. Bathing: Staff assists with bathing Tub Shower transfers: Requires staff assist Equipment: TBD  IADLs: Shopping: N/A Light housekeeping: Provided by staff Meal Prep: 3 Meals a day  provided. Pt. Eats in the main dining room when family visits, otherwise Pt. Eats in her room.  Community mobility: Relies on family for transportation to therapy. Per daughter- Transportation is provided for services not offered at the facility. Medication management: Provided by the facility Financial management: Daughter manages Handwriting: TBD  MOBILITY STATUS: Hx of falls  FUNCTIONAL OUTCOME MEASURES:  UPPER EXTREMITY ROM:    Passive ROM Left Eval AROM WFL Right Eval  Right 01/02/24 Right 02/08/24  Shoulder flexion  0(46) 25 (90) 0(94)  Shoulder abduction  0(64) 0 (90) 0(78)  Shoulder adduction      Shoulder extension      Shoulder internal rotation      Shoulder external rotation   (25) 0(25)  Elbow flexion  0(98) 0(131) 0(133)  Elbow extension  0(full passive) 0 (full passive) 0(full passive)  Wrist flexion  0(56) 0(90) 0(74)  Wrist extension  -56(30) 0 (40) 6(42)  Wrist ulnar deviation      Wrist radial deviation      Wrist pronation      Wrist supination      (Blank rows = not tested)  UPPER EXTREMITY MMT:     MMT Left Eval 5/5 Right eval Right  01/02/24 Right 02/08/24  Shoulder flexion  0/5 2/5  0/5  Shoulder abduction  0/5 1/5 0/5  Shoulder adduction   2-/5 1/5  Shoulder extension      Shoulder internal rotation   1/5 0/5  Shoulder external rotation      Middle trapezius      Lower trapezius      Elbow flexion  0/5 0/5 0/5  Elbow extension  0/5 2/5 1/5  Wrist flexion   1/5   Wrist extension  0/5 3-/5 1/5  Wrist ulnar deviation      Wrist radial deviation      Wrist pronation  0/5 0/5 1/5  Wrist supination  0/5 0/5 0/5  Digit flexors   2-/5 2-/5  Digit extensors   0/5 0/5  (Blank rows = not tested)  HAND FUNCTION: N/A  COORDINATION: N/A  SENSATION: Diminished  EDEMA: No edema noted in the right hand today  MUSCLE TONE: impaired  COGNITION: Overall cognitive status: Aphasia  VISION: Wears glasses, no change from  baseline  PERCEPTION: TBD  PRAXIS: Impaired: Motor planning                                                                                                                           TREATMENT DATE: 02/20/24    Manual therapy:   -Soft tissue massage to the scapular, pectoral, and right shoulder musculature. -Soft  tissue mobilizations to promote radius on ulna motion needed for supination.  -Manual therapy was performed independent of, and in preparation for therapeutic Ex.  Therapeutic Exercise:   -PROM through the following joint ranges of the RUE following moist heat modality, including: shoulder flexion, abduction, ER, horizontal abduction, elbow flexion, extension, forearm supination, wrist extension, digit extension, and thumb abduction, working to increase RUE mobility to manage hemiparetic limb during basic ADLs, as well as reducing contracture risk. Janine Melbourne for R shoulder flex/ext, horizontal  abduction/adduction. -Cues, and hand over hand assist were required for self-ROM HEP for the UE.  Therapeutic Activities:  -facilitated RUE diagonal patterns of movement with hand over hand assist in preparation for self-grooming tasks.  PATIENT EDUCATION: Education details: RUE PROM/AAROM, Pt./caregiver education about possible restorative ROM programs at SNF Person educated: Patient Education method: Explanation, Demonstration, Tactile cues, and Verbal cues Education comprehension: verbal cues required, tactile cues required, and needs further education  HOME EXERCISE PROGRAM: RUE self PROM  GOALS: Goals reviewed with patient? Yes  SHORT TERM GOALS: Target date: 01/16/2024    Pt. Will demonstrate supervision with HEPs for BUEs Baseline: Eval: No current HEPs; 01/02/24: Pt demos self PROM techniques throughout the RUE with intermittent min A and mod vc for accuracy 02/08/24: Pt. requires cues for initiation, and hand over hand assist. 02/20/24:  Goal status: in progress  LONG TERM  GOALS: Target date: 05/02/2024    1.  Pt. Will improve right shoulder flexion to 45* to assist with UE dressing Baseline: Eval: Right: 0(46); 01/02/24: Right: 25 (90) 02/08/24: 0(94) 02/20/24: Limited. Assist required for UE dressing Goal status:  Ongoing  2.  Pt. Will improve with shoulder abduction by 20 degrees to assist with underarm care, and hygiene Baseline: Eval: Right 0(64); 01/02/24: Right 0 (90) 02/08/24: 0(78) 02/20/24: Limited. Assist required for underarm hygiene  Goal status: Ongoing  3.  Pt. Will improve right elbow flexion by 10 degrees to assist with hand to face patterns for grooming Baseline: Eval: Right: 0(98); 01/02/24: Right: 0(131) 02/08/24: 1(610) 02/20/24: limited 2/2 tightness Goal status: Ongoing  4.  Pt. Will elicit active wrist extension, and digit extension in preparation for rubbing lotion into the left forearm, and hand  Baseline: Eval: Right wrist: -56 (30); 01/02/24: R wrist: ext: actively to neutral/0 (passively to 40*); R digit extension: no active ext (passively [facility] for this task); unable to self apply lotion to L forearm 02/08/24: right wrist extension: 6(42) 02/20/24: Continue Goal status: I Ongoing  5.  Pt. Will perform UE dressing skills with MinA Baseline: Eval: Mod-MaxA doffing a jacket; 01/02/24: Mod A to don/doff a jacket 02/08/24: ModA to thread the right sleeve, minA to bring the shirt around the back of the neck, and donn the left sleeve. ModA to align seams. 02/20/24: ModA to thread the right sleeve, minA to bring the shirt around the back of the neck, and donn the left sleeve. ModA to align seams.  Goal status: Ongoing  6. Pt. Will require minA toileting transfers/ clothing management Baseline: Eval: Pt. requires increased assist from staff; 01/02/24: pt can transfer to standard height toilet with grab bar for support with min A, but requires mod A to transfer up from toilet; Pt consistently performs wc<>BSC transfer with min A, extra time, and vc for sequencing  while using hemi walker; total A for clothing management. 02/08/24: MinA, Total assist clothing management 02/20/24: MinA, Total assist clothing management   Goal status: in progress  ASSESSMENT:  CLINICAL IMPRESSION:  Pt. continues to present with limited activation of active muscle responses in the RUE, and hand. Pt. continues to present with increased tone, and tightness in right scapular musculature, right shoulder flexion, abduction, elbow flexion, wrist, and digit extension with MP flexion tightness limiting full MP extension. Pt. continues to benefit from OT services to normalize tone in the dominant RUE, improve ROM for underarm hygiene care and UE dressing, as well as to improve toileting care, and clothing negotiation tasks.    PERFORMANCE DEFICITS: in functional skills including ADLs, IADLs, coordination, dexterity, proprioception, sensation, ROM, strength, pain, Fine motor control, Gross motor control, balance, body mechanics, decreased knowledge of use of DME, and UE functional use, cognitive skills including problem solving, and psychosocial skills including coping strategies, environmental adaptation, and routines and behaviors.   IMPAIRMENTS: are limiting patient from ADLs, IADLs, and leisure.   CO-MORBIDITIES: may have co-morbidities  that affects occupational performance. Patient will benefit from skilled OT to address above impairments and improve overall function.  MODIFICATION OR ASSISTANCE TO COMPLETE EVALUATION: Min-Moderate modification of tasks or assist with assess necessary to complete an evaluation.  OT OCCUPATIONAL PROFILE AND HISTORY: Detailed assessment: Review of records and additional review of physical, cognitive, psychosocial history related to current functional performance.  CLINICAL DECISION MAKING: Moderate - several treatment options, min-mod task modification necessary  REHAB POTENTIAL: Good  EVALUATION COMPLEXITY: Moderate    PLAN:  OT FREQUENCY:  1x/week  OT DURATION: 12 weeks  PLANNED INTERVENTIONS: 97168 OT Re-evaluation, 97535 self care/ADL training, 16109 therapeutic exercise, 97530 therapeutic activity, 97112 neuromuscular re-education, 97140 manual therapy, 97018 paraffin, 60454 moist heat, 97010 cryotherapy, 97034 contrast bath, passive range of motion, coping strategies training, patient/family education, and DME and/or AE instructions  RECOMMENDED OTHER SERVICES: PT, ST  CONSULTED AND AGREED WITH PLAN OF CARE: Patient  PLAN FOR NEXT SESSION: see above  Duey Ghent, MS, OTR/L   02/20/2024, 12:55 PM

## 2024-02-20 NOTE — Therapy (Signed)
 OUTPATIENT PHYSICAL THERAPY NEURO TREATMENT    Patient Name: Lindsey [name] MRN: [id number] DOB:06/05/1956, 68 y.o., female Today's Date: 02/20/2024   PCP: Lorina Roosevelt, MD  REFERRING PROVIDER: Liam Redhead, MD   END OF SESSION:   PT End of Session - 02/20/24 0913     Visit Number 22    Number of Visits 34    Date for PT Re-Evaluation 04/02/24    Progress Note Due on Visit 20    PT Start Time 0852    PT Stop Time 0930    PT Time Calculation (min) 38 min    Equipment Utilized During Treatment Gait belt   custom R LE AFO   Activity Tolerance Patient tolerated treatment well    Behavior During Therapy WFL for tasks assessed/performed                Past Medical History:  Diagnosis Date   Aphasia    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   REFERRING DIAG:  Diagnosis  I69.398,R25.2 (ICD-10-CM) - Spasticity as late effect of cerebrovascular accident (CVA)    THERAPY DIAG:  Muscle weakness (generalized)  Apraxia  Other lack of coordination  Unsteadiness on feet  Difficulty in walking, not elsewhere classified  Abnormality of gait and mobility  Other abnormalities of gait and mobility  Rationale for Evaluation and Treatment: Rehabilitation  SUBJECTIVE:  SUBJECTIVE STATEMENT:  Arrives late to scheduled PT,  Pt with aphasia, responding primarily to yes/no questions. No pain today. Daughter requesting note to staff at ALF confirming that pt can complete stand pivot transfer to and from [facility] over toilet.   From EVAL: Daughter present for Evaluation to provide hx. Pt is familiar to this clinic and has been seen for multiple bouts PT since initial CVA in 2021. Pt d/c'ed from PT serviced in April of 2024 with plans to receive PT services through [facility]. Daughter reports that she was only receiving minimal therapy in facility. Reports at least 1 fall in the last 6 months and daughter asked staff to transfer pt with lift since fall.  Daughter would want pt to demonstrate improve safety with transfers to allow bil transfers with reduced use of rail to allow pt to return home for assisted living.  Unable to discern whether pt in SNF or assisted living care from hx.    Pt accompanied by: self   PERTINENT HISTORY:  1. Acute Ischemic Left MCA Stroke:Right Hemiplegia: Ordered outpatient OT, PT, and SLP. She has had a HFU appointment with Neurology. Discussed that I can provide new scripts next month if needed. Communicated with her providers regarding positive reinforcement. She has made excellent  gains in strength as well as cognition and speech!   -Provided with new disability placard.    2. Essential Hypertension: BP is currently very elevated and discussed with patient that it would be unsafe for her to receive injections today as the injections can temporarily increase her pain, which can increase her blood pressure, and put her at risk for stroke. Discussed starting the medication Valsartan  40mg  daily and logging blood pressures daily at home and she is agreeable. Medications reviewed and she is taking Amlodipine  5mg  and lopressor .  Provided with a list of foods that will help lower her BP.  BP reviewed and improved at 136/82 -continue clonidine -Advised checking BP daily at home and logging results to bring into follow-up appointment with PCP and myself. -Reviewed BP meds today.  -Advised regarding healthy foods that can help lower blood pressure and provided with a list:  PAIN:  Are you having pain? No  PRECAUTIONS: Fall  RED FLAGS: None   WEIGHT BEARING RESTRICTIONS: No  FALLS: Has patient fallen in last 6 months? Yes. Number of falls 1  LIVING ENVIRONMENT: Lives with: lives with their family and lives in a skilled nursing facility Lives in: Other SNF  Stairs: No Has following equipment at home: Hemi walker and Wheelchair (manual)  PLOF: Requires assistive device for independence, Needs assistance with ADLs, Needs assistance with homemaking, and WC level currently   PATIENT GOALS: move better - walking or transferring with hemi walking   OBJECTIVE:  Note: Objective measures were completed at Evaluation unless otherwise noted.  DIAGNOSTIC FINDINGS:  Head CT:  IMPRESSION: 1. No acute intracranial abnormality. 2. No acute displaced fracture or traumatic listhesis of the cervical spine.  Cervical CT:  MPRESSION: 1. No acute intracranial abnormality. 2. No acute displaced fracture or traumatic listhesis of the cervical spine.  COGNITION: Overall cognitive  status: History of cognitive impairments - at baseline   SENSATION: Light touch: Impaired  Proprioception: Impaired  Able to detect depe pressure   COORDINATION: Increased tone on the RLE   EDEMA:  Mild RLE distal edema   MUSCLE TONE: RLE: Mild and Moderate  MUSCLE LENGTH: Hamstrings: grossly limited    POSTURE: rounded shoulders, forward head, and posterior  bias   LOWER EXTREMITY ROM:     Active  Right Eval Left Eval  Hip flexion  Limited to 5 deg beyond 90 in sitting  Knee flexion  90deg in sitting  Knee extension  Lacking 10 deg full extension  Ankle dorsiflexion  none  Ankle plantarflexion  none   (Blank rows = not tested)  LOWER EXTREMITY MMT:    MMT Right Eval Left Eval  Hip flexion  2+  Hip extension    Hip abduction  2  Hip adduction  3  Hip internal rotation    Hip external rotation    Knee flexion  2+  Knee extension  3+  Ankle dorsiflexion  0  Ankle plantarflexion  0  Ankle inversion    Ankle eversion    (Blank rows = not tested)  BED MOBILITY:  Sit to supine Mod A Supine to sit Mod A Rolling to Right Min A Rolling to Left Min A  TRANSFERS: Assistive device utilized: Hemi walker  Sit to stand: Mod A Stand to sit: Mod A Chair to chair: Mod A Floor: unable to perform     CURB:  Level of Assistance: Total A Assistive device utilized: rail Curb Comments: unable to perform   STAIRS: Unable to perform at eval   GAIT: Gait pattern: non-functional constant posterior bias , step to pattern, decreased stance time- Right, Right hip hike, lateral hip instability, and lateral lean- Left Distance walked: 28ft Assistive device utilized: Hemi walker Level of assistance: Mod A Comments: moderate cues for sequencing of HW and gait pattern in turns   FUNCTIONAL TESTS:  5 times sit to stand: 48 sec with mod Assist from PT  Timed up and go (TUG): unable to perform  10 meter walk test: 6 ft with min-mod assist and HW.  FIST:  Function In  Sitting Test (FIST)  (1/2 femur on surface; hips/knees flexed to 90deg)   - indicate bed or mat table / step stool if used  SCORING KEY: 4 = Independent (completes task independently & successfully) 3 = Verbal Cues/Increased Time (completes task independently & successfully and only needs more time/cues) 2 = Upper Extremity Support (must use UE for support or assistance to complete successfully) 1 = Needs Assistance (unable to complete w/o physical assist; DOCUMENT LEVEL: min, mod, max) 0 = Dependent (requires complete physical assist; unable to complete successfully even w/ physical assist)  Randomly Administer Once Throughout Exam  4 - Anterior Nudge (superior sternum)  4 - Posterior Nudge (between scapular spines)  4 - Lateral Nudge (to dominant side at acromion)     4 - Static sitting (30 seconds)  4 - Sitting, shake 'no' (left and right)  4 - Sitting, eyes closed (30 seconds)   3 - Sitting, lift foot (dominant side, lift foot 1 inch twice)    4 - Pick up object from behind (object at midline, hands breadth posterior)  1 mod assist - Forward reach (use dominant arm, must complete full motion) 1 mod assist - Lateral reach (use dominant arm, clear opposite ischial tuberosity) 1 mod assist  - Pick up object from floor (from between feet)   1 mod assist - Posterior scooting (move backwards 2 inches)  1 min assist  - Anterior scooting (move forward 2 inches)  1 min assist  - Lateral scooting (move to dominant side 2 inches)    11/21/23 TOTAL = 37/56  02/13/24 TOTAL = 46/56   Notes/comments: apraxia limiting success of scooting and reacting  tasks.  MCD > 5 points MCID for IP REHAB > 6 points      PATIENT SURVEYS:  To be completed                                                                                                                               TREATMENT DATE: 02/20/2024  Pt performed blocked practice Stand pivot transfer to and from arm chair min assist and UE  supported on arm rest and support rail on the L. Performed x 4 bil.   Sit<>stand x 5 with UE support on arm rest x2 sets. With min assist for anterior weight shift as well as cues for use of arm rest into standing and to return to sitting.   Side stepping at rail x 3 bil with cues for weight shift to allow improved step heigh and length on the RLE.   Gait with HW x 9ft and 69ft with min assist overall as well as mod assist from PT for safety intermittently to prevent posterior LOB x 2 on each bout.   At family member required, note provided to ALF staff, confirming that pt is able to perform stand pivot and sit<>stand transfer with min assist and proper UE support. PT then provided Significant education to family member on ALF safety policy. And that ALF has the right to transfer pt's as they see fit based on patients movement patterns and assist needed to reduce the risk of falls and subsequent injury.     PATIENT EDUCATION: Education details: POC.  Pt educated throughout session about proper posture and technique with exercises. Improved exercise technique, movement at target joints, use of target muscles after min to mod verbal, visual, tactile cues.  Safety with transfers and ALF staff members.   Person educated: Patient and Child(ren) Education method: Explanation Education comprehension: verbalized understanding  HOME EXERCISE PROGRAM: Access Code: [email or web address] URL: [email or web address] Date: 12/05/2023 Prepared by: Aurora Lees  Exercises - Sit to Stand with Counter Support  - 1 x daily - 3-4 x weekly - 2 sets - 10 reps - Seated Long Arc Quad  - 1 x daily - 3-4 x weekly - 2 sets - 10 reps - 2 hold - Seated March  - 1 x daily - 3-4 x weekly - 2 sets - 10 reps - 2 hold - Standing Hip Abduction with Counter Support  - 1 x daily - 7 x weekly - 3 sets - 5 reps - Wide Stance with Counter Support  - 1 x daily - 7 x weekly - 3 sets - 2 reps - 30 hold - Seated Hip Abduction   - 1 x daily - 7 x weekly - 3 sets - 10 reps - Supine Bridge  - 1 x daily - 7 x weekly - 3 sets - 10 reps - Supine Short Arc Quad  - 1 x daily - 7 x weekly - 3 sets - 10 reps - Bent  Knee Fallouts  - 1 x daily - 7 x weekly - 3 sets - 10 reps - Small Range Straight Leg Raise  - 1 x daily - 7 x weekly - 3 sets - 10 reps - Supine Heel Slide  - 1 x daily - 7 x weekly - 3 sets - 10 reps   GOALS: Goals reviewed with patient? Yes  SHORT TERM GOALS: Target date: 12/13/2023    Patient will be independent in home exercise program to improve strength/mobility for better functional independence with ADLs. Baseline: to be provided on visit 2: 12/28/2023- No caregiver with patient to determine if compliant with HEP Goal  5/13: daugher states that they are completing some exercises almost daily  status: IN PROGRESS   LONG TERM GOALS: Target date: 04/02/2024       2.  Patient (> 83 years old) will complete five times sit to stand test by 15 seconds and only min assist indicating an increased LE strength and improved balance. Baseline: 48 with mod assist from PT; 12/28/2023= 44 sec from EOM at lowest postion (approx 21 in) with Left UE support and CGA to min A from PT 5/13: 28.80 sec with HW from [facility]; min assist from PT for full erect standing Goal status: Progressing  3.  Patient will increase FIST score by > 6 points to demonstrate decreased fall risk during functional activities Baseline: 37 5/13: 46/56 Goal status: MET  4.  Patient will complete 10 meter walk test to with HW in less than 1 minute with min assist as to improve gait speed for better community ambulation and to reduce fall risk. Baseline: 6 ft with HW and mod assist; 12/28/2023=18 feet with HW- mod assist  5/13: 2:11 min with HW, min-mod assist.  Goal status: PROGRESSING  5.  Patient will perform stand pivot transfer to and from [facility] with HW and CGA for safety to allow safe transfer to toilet with daughter without use of rail on the  wall. Baseline: mod assist and heavy use of rail. 12/28/2023= Min to Mod assist with repetitive VC for technique 5/13: min assist overall with HW; mod assist initially both directions  to prevent posterior LOB. Goal status: Progressing  6.  Patient will improve bed mobility to min assist only to reduce care giver burden and improve safety of transfers  Baseline: mod-max assist 5/13: Min-mod assist for sit<>supine. Min assist only for RLE management for rolling Goal status: INITIAL  ASSESSMENT:  CLINICAL IMPRESSION:  Patient presents with good motivation for therapy appointment.  Daughter present. Blocked practice stand pivot transfers with use of rail to simulate transfer to [facility] over toilet in the bathroom. Education provided for safety policies that ALF may have for transfer assistance that is allow by staff to reduce risk of pt and staff injury, and that staff reserve the right to transfer pt as they feel safe based on policies of facility. Pt tolerated all standing interventions well with min assist overall with mod assist for safety intermittently due to posterior LOB.Ms. [place] will benefit from continued skilled PT to address strength and balance deficits to improve safety for transfers, reduce care giver burden, and allow return to PLOF and possible return home from Assisted living.    OBJECTIVE IMPAIRMENTS: Abnormal gait, cardiopulmonary status limiting activity, decreased activity tolerance, decreased balance, decreased cognition, decreased coordination, decreased endurance, decreased knowledge of use of DME, decreased mobility, difficulty walking, decreased ROM, decreased strength, hypomobility, increased muscle spasms, impaired flexibility, impaired sensation, impaired tone, impaired  UE functional use, improper body mechanics, and postural dysfunction.   ACTIVITY LIMITATIONS: carrying, lifting, bending, standing, squatting, stairs, transfers, bed mobility, continence, bathing, toileting,  dressing, reach over head, hygiene/grooming, and locomotion level  PARTICIPATION LIMITATIONS: cleaning, interpersonal relationship, shopping, and community activity  PERSONAL FACTORS: 3+ comorbidities: HTN, chronic CVA, Afib, Lupus  are also affecting patient's functional outcome.   REHAB POTENTIAL: Fair chronic CVA with poor progress in prior interventions with this clinic  CLINICAL DECISION MAKING: Unstable/unpredictable  EVALUATION COMPLEXITY: High  PLAN:  PT FREQUENCY: 1-2x/week  PT DURATION: 12 weeks  PLANNED INTERVENTIONS: 97110-Therapeutic exercises, 97530- Therapeutic activity, [id number]- Neuromuscular re-education, 97535- Self Care, [id number]- Manual therapy, [phone]- Gait training, [phone]- Orthotic Fit/training, [phone]- Aquatic Therapy, [phone]- Splinting, 97014- Electrical stimulation (unattended), [phone]- Electrical stimulation (manual), Patient/Family education, Balance training, Stair training, Joint mobilization, Vestibular training, Visual/preceptual remediation/compensation, Cognitive remediation, DME instructions, Wheelchair mobility training, Cryotherapy, and Moist heat  PLAN FOR NEXT SESSION:   -  R glute activation and strength during standing  Continue  standing balance.  Gait.  Transfers with decreased UE support and awareness of posterior LOB.    Aurora Lees PT, DPT  Physical Therapist - [facility]  9:15 AM 02/20/24

## 2024-02-22 ENCOUNTER — Ambulatory Visit: Payer: Medicare Other | Admitting: Speech Pathology

## 2024-02-22 ENCOUNTER — Ambulatory Visit: Payer: Medicare Other | Admitting: Physical Therapy

## 2024-02-22 ENCOUNTER — Ambulatory Visit: Payer: Medicare Other | Admitting: Occupational Therapy

## 2024-02-27 ENCOUNTER — Ambulatory Visit: Payer: Medicare Other | Admitting: Physical Therapy

## 2024-02-27 ENCOUNTER — Ambulatory Visit: Payer: Medicare Other | Admitting: Speech Pathology

## 2024-02-27 ENCOUNTER — Ambulatory Visit: Payer: Medicare Other | Admitting: Occupational Therapy

## 2024-02-27 DIAGNOSIS — R278 Other lack of coordination: Secondary | ICD-10-CM

## 2024-02-27 DIAGNOSIS — R269 Unspecified abnormalities of gait and mobility: Secondary | ICD-10-CM

## 2024-02-27 DIAGNOSIS — R2689 Other abnormalities of gait and mobility: Secondary | ICD-10-CM

## 2024-02-27 DIAGNOSIS — R4701 Aphasia: Secondary | ICD-10-CM | POA: Diagnosis not present

## 2024-02-27 DIAGNOSIS — R262 Difficulty in walking, not elsewhere classified: Secondary | ICD-10-CM

## 2024-02-27 DIAGNOSIS — R482 Apraxia: Secondary | ICD-10-CM

## 2024-02-27 DIAGNOSIS — M6281 Muscle weakness (generalized): Secondary | ICD-10-CM

## 2024-02-27 DIAGNOSIS — R2681 Unsteadiness on feet: Secondary | ICD-10-CM

## 2024-02-27 NOTE — Therapy (Signed)
 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) 09/24/2021   Cerebral embolism with cerebral infarction 09/23/2021   Pyelonephritis 09/19/2021   Pain due to onychomycosis of toenails of both feet 11/19/2020   Normocytic anemia 05/31/2020   Acute ischemic left MCA stroke (HCC) 04/30/2020   Right hemiplegia (HCC) 04/30/2020   Cerebrovascular accident (HCC) 04/21/2020   Atrial fibrillation with RVR (HCC) 04/21/2020   Essential hypertension 04/21/2020   Alcohol abuse 04/21/2020   ONSET DATE: 09/19/21  REFERRING DIAG: CVA  THERAPY DIAG:  Muscle weakness (generalized)  Rationale for Evaluation  and Treatment: Rehabilitation  SUBJECTIVE:  SUBJECTIVE STATEMENT:  Pt took transportation services to treatment this morning Pt Pt accompanied by: self, daughter  PERTINENT HISTORY: Pt. is a 68 y.o. 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 Pt. has Right sided weakness, and receptive, and expressive aphasia. Pt. has had a previous CVA with right sided weakness in July 2021. PMHx includes: AFib, Renal Artery thrombosis, situational depression, HTN, Pyelnephritis, Seizure activity, COnstipation, AKI. Vitamin D  deficiency, and urinary incontinence   PRECAUTIONS: None  WEIGHT BEARING RESTRICTIONS: No  PAIN:  Are you having pain? 2/10 FACES tightness/aching pain in the RUE  FALLS: Has patient fallen in last 6 months? Yes. Number of falls 1-ER visit  LIVING ENVIRONMENT: Pt. resides at [facility] Pt. has very supportive family Stairs: One level Has following equipment at home:  Equipment provided through [employer]  PLOF: Needs assistance with ADLs  PATIENT GOALS:  To improve ROM/flexibility in the RUE 2/2 increased tightness, and to improve ADL functioning in order to towards returning home.  OBJECTIVE:   Note: Objective measures were completed at Evaluation unless otherwise noted.  HAND DOMINANCE: Right  ADLs: Overall ADLs: Assist is provided from the staff at [facility] Transfers/ambulation related to ADLs: Eating: independent, with set-up using the left hand Grooming: Independent with set-up using the left hand UB Dressing: Increased assist  required, Mod-MaxA doffing jacket LB Dressing: Increased assist required Toileting:  Staff Assists with toileting transfers, and toilet hygiene care. Bathing: Staff assists with bathing Tub Shower transfers: Requires staff assist Equipment: TBD  IADLs: Shopping: N/A Light housekeeping: Provided by staff Meal Prep: 3 Meals a day provided. Pt. Eats  in the main dining room when family visits, otherwise Pt. Eats in her room.  Community mobility: Relies on family for transportation to therapy. Per daughter- Transportation is provided for services not offered at the facility. Medication management: Provided by the facility Financial management: Daughter manages Handwriting: TBD  MOBILITY STATUS: Hx of falls  FUNCTIONAL OUTCOME MEASURES:  UPPER EXTREMITY ROM:    Passive ROM Left Eval AROM WFL Right Eval  Right 01/02/24 Right 02/08/24  Shoulder flexion  0(46) 25 (90) 0(94)  Shoulder abduction  0(64) 0 (90) 0(78)  Shoulder adduction      Shoulder extension      Shoulder internal rotation      Shoulder external rotation   (25) 0(25)  Elbow flexion  0(98) 0(131) 0(133)  Elbow extension  0(full passive) 0 (full passive) 0(full passive)  Wrist flexion  0(56) 0(90) 0(74)  Wrist extension  -56(30) 0 (40) 6(42)  Wrist ulnar deviation      Wrist radial deviation      Wrist pronation      Wrist supination      (Blank rows = not tested)  UPPER EXTREMITY MMT:     MMT Left Eval 5/5 Right eval Right  01/02/24 Right 02/08/24  Shoulder flexion  0/5 2/5  0/5  Shoulder abduction  0/5 1/5 0/5  Shoulder adduction   2-/5 1/5  Shoulder extension      Shoulder internal rotation   1/5 0/5  Shoulder external rotation      Middle trapezius      Lower trapezius      Elbow flexion  0/5 0/5 0/5  Elbow extension  0/5 2/5 1/5  Wrist flexion   1/5   Wrist extension  0/5 3-/5 1/5  Wrist ulnar deviation      Wrist radial deviation      Wrist pronation  0/5 0/5 1/5  Wrist supination  0/5 0/5 0/5  Digit flexors   2-/5 2-/5  Digit extensors   0/5 0/5  (Blank rows = not tested)  HAND FUNCTION: N/A  COORDINATION: N/A  SENSATION: Diminished  EDEMA: No edema noted in the right hand today  MUSCLE TONE: impaired  COGNITION: Overall cognitive status: Aphasia  VISION: Wears glasses, no change from baseline  PERCEPTION: TBD  PRAXIS:  Impaired: Motor planning                                                                                                                           TREATMENT DATE: 02/27/24    Manual therapy:   -Soft tissue massage to the scapular, pectoral, and right shoulder musculature. -Soft tissue mobilizations to promote radius on ulna motion  needed for supination.  -Manual therapy was performed independent of, and in preparation for therapeutic Ex.  Therapeutic Exercise:   -PROM through the following joint ranges of the RUE following moist heat modality, including: shoulder flexion, abduction, ER, horizontal abduction, elbow flexion, extension, forearm supination, wrist extension, digit extension, and thumb abduction, working to increase RUE mobility to manage hemiparetic limb during basic ADLs, as well as reducing contracture risk. Janine Melbourne for R shoulder flex/ext, horizontal  abduction/adduction. -Cues, and hand over hand assist were required for self-ROM HEP for the UE.  Therapeutic Activities:  -facilitated RUE diagonal patterns of movement with hand over hand assist in preparation for self-grooming tasks.  PATIENT EDUCATION: Education details: RUE PROM/AAROM, Pt./caregiver education about possible restorative ROM programs at SNF Person educated: Patient Education method: Explanation, Demonstration, Tactile cues, and Verbal cues Education comprehension: verbal cues required, tactile cues required, and needs further education  HOME EXERCISE PROGRAM: RUE self PROM  GOALS: Goals reviewed with patient? Yes  SHORT TERM GOALS: Target date: 01/16/2024    Pt. Will demonstrate supervision with HEPs for BUEs Baseline: Eval: No current HEPs; 01/02/24: Pt demos self PROM techniques throughout the RUE with intermittent min A and mod vc for accuracy 02/08/24: Pt. requires cues for initiation, and hand over hand assist. 02/20/24:  Goal status: in progress  LONG TERM GOALS: Target date: 05/02/2024    1.   Pt. Will improve right shoulder flexion to 45* to assist with UE dressing Baseline: Eval: Right: 0(46); 01/02/24: Right: 25 (90) 02/08/24: 0(94) 02/20/24: Limited. Assist required for UE dressing Goal status:  Ongoing  2.  Pt. Will improve with shoulder abduction by 20 degrees to assist with underarm care, and hygiene Baseline: Eval: Right 0(64); 01/02/24: Right 0 (90) 02/08/24: 0(78) 02/20/24: Limited. Assist required for underarm hygiene  Goal status: Ongoing  3.  Pt. Will improve right elbow flexion by 10 degrees to assist with hand to face patterns for grooming Baseline: Eval: Right: 0(98); 01/02/24: Right: 0(131) 02/08/24: 8(295) 02/20/24: limited 2/2 tightness Goal status: Ongoing  4.  Pt. Will elicit active wrist extension, and digit extension in preparation for rubbing lotion into the left forearm, and hand  Baseline: Eval: Right wrist: -56 (30); 01/02/24: R wrist: ext: actively to neutral/0 (passively to 40*); R digit extension: no active ext (passively [facility] for this task); unable to self apply lotion to L forearm 02/08/24: right wrist extension: 6(42) 02/20/24: Continue Goal status: I Ongoing  5.  Pt. Will perform UE dressing skills with MinA Baseline: Eval: Mod-MaxA doffing a jacket; 01/02/24: Mod A to don/doff a jacket 02/08/24: ModA to thread the right sleeve, minA to bring the shirt around the back of the neck, and donn the left sleeve. ModA to align seams. 02/20/24: ModA to thread the right sleeve, minA to bring the shirt around the back of the neck, and donn the left sleeve. ModA to align seams.  Goal status: Ongoing  6. Pt. Will require minA toileting transfers/ clothing management Baseline: Eval: Pt. requires increased assist from staff; 01/02/24: pt can transfer to standard height toilet with grab bar for support with min A, but requires mod A to transfer up from toilet; Pt consistently performs wc<>BSC transfer with min A, extra time, and vc for sequencing while using hemi walker; total A for  clothing management. 02/08/24: MinA, Total assist clothing management 02/20/24: MinA, Total assist clothing management   Goal status: in progress  ASSESSMENT:  CLINICAL IMPRESSION:  Pt. Requires hand-over-hand assist for all ROM 2/2  limited activation of active muscle responses in the RUE, and hand. Pt. continues to present with increased tone, and tightness in right scapular musculature, right shoulder flexion, abduction, elbow flexion, wrist, and digit extension with MP flexion tightness limiting full MP extension. Pt. continues to benefit from OT services to normalize tone in the dominant RUE, improve ROM for underarm hygiene care and UE dressing, as well as to improve toileting care, and clothing negotiation tasks.    PERFORMANCE DEFICITS: in functional skills including ADLs, IADLs, coordination, dexterity, proprioception, sensation, ROM, strength, pain, Fine motor control, Gross motor control, balance, body mechanics, decreased knowledge of use of DME, and UE functional use, cognitive skills including problem solving, and psychosocial skills including coping strategies, environmental adaptation, and routines and behaviors.   IMPAIRMENTS: are limiting patient from ADLs, IADLs, and leisure.   CO-MORBIDITIES: may have co-morbidities  that affects occupational performance. Patient will benefit from skilled OT to address above impairments and improve overall function.  MODIFICATION OR ASSISTANCE TO COMPLETE EVALUATION: Min-Moderate modification of tasks or assist with assess necessary to complete an evaluation.  OT OCCUPATIONAL PROFILE AND HISTORY: Detailed assessment: Review of records and additional review of physical, cognitive, psychosocial history related to current functional performance.  CLINICAL DECISION MAKING: Moderate - several treatment options, min-mod task modification necessary  REHAB POTENTIAL: Good  EVALUATION COMPLEXITY: Moderate    PLAN:  OT FREQUENCY: 1x/week  OT  DURATION: 12 weeks  PLANNED INTERVENTIONS: 97168 OT Re-evaluation, 97535 self care/ADL training, 16109 therapeutic exercise, 97530 therapeutic activity, 97112 neuromuscular re-education, 97140 manual therapy, 97018 paraffin, 60454 moist heat, 97010 cryotherapy, 97034 contrast bath, passive range of motion, coping strategies training, patient/family education, and DME and/or AE instructions  RECOMMENDED OTHER SERVICES: PT, ST  CONSULTED AND AGREED WITH PLAN OF CARE: Patient  PLAN FOR NEXT SESSION: see above  Duey Ghent, MS, OTR/L   02/27/2024, 10:49 AM

## 2024-02-27 NOTE — Therapy (Signed)
 OUTPATIENT PHYSICAL THERAPY NEURO TREATMENT    Patient Name: Lindsey [name] MRN: [id number] DOB:[birth year], 68 y.o., female Today's Date: 02/27/2024   PCP: Lorina Roosevelt, MD  REFERRING PROVIDER: Liam Redhead, MD   END OF SESSION:   PT End of Session - 02/27/24 0905     Visit Number 23    Number of Visits 34    Date for PT Re-Evaluation 04/02/24    Progress Note Due on Visit 20    PT Start Time 0850    PT Stop Time 0930    PT Time Calculation (min) 40 min    Equipment Utilized During Treatment Gait belt   custom R LE AFO   Activity Tolerance Patient tolerated treatment well    Behavior During Therapy WFL for tasks assessed/performed                Past Medical History:  Diagnosis Date   Aphasia    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   REFERRING DIAG:  Diagnosis  I69.398,R25.2 (ICD-10-CM) - Spasticity as late effect of cerebrovascular accident (CVA)    THERAPY DIAG:  Muscle weakness (generalized)  Other lack of coordination  Unsteadiness on feet  Difficulty in walking, not elsewhere classified  Abnormality of gait and mobility  Other abnormalities of gait and mobility  Apraxia  Rationale for Evaluation and Treatment: Rehabilitation  SUBJECTIVE:  SUBJECTIVE STATEMENT:  Pt with aphasia, responding primarily to yes/no questions. No pain today. No family present today   From EVAL: Daughter present for Evaluation to provide hx. Pt is familiar to this clinic and has been seen for multiple bouts PT since initial CVA in 2021. Pt d/c'ed from PT serviced in April of 2024 with plans to receive PT services through [facility]. Daughter reports that she was only receiving minimal therapy in facility. Reports at least 1 fall in the last 6 months and daughter asked staff to transfer pt with lift since fall.  Daughter would want pt to demonstrate improve safety with transfers to allow bil transfers with reduced use of rail to allow pt to return home for assisted living.  Unable to discern whether pt in SNF or assisted living care from hx.    Pt accompanied by: self   PERTINENT HISTORY:  1. Acute Ischemic Left MCA Stroke:Right Hemiplegia: Ordered outpatient OT, PT, and SLP. She has had a HFU appointment with Neurology. Discussed that I can provide new scripts next month if needed. Communicated with her providers regarding positive reinforcement. She has made excellent gains in strength as well as cognition and speech!   -Provided with new disability placard.    2. Essential Hypertension: BP is  currently very elevated and discussed with patient that it would be unsafe for her to receive injections today as the injections can temporarily increase her pain, which can increase her blood pressure, and put her at risk for stroke. Discussed starting the medication Valsartan  40mg  daily and logging blood pressures daily at home and she is agreeable. Medications reviewed and she is taking Amlodipine  5mg  and lopressor .  Provided with a list of foods that will help lower her BP.  BP reviewed and improved at 136/82 -continue clonidine -Advised checking BP daily at home and logging results to bring into follow-up appointment with PCP and myself. -Reviewed BP meds today.  -Advised regarding healthy foods that can help lower blood pressure and provided with a list:  PAIN:  Are you having pain? No  PRECAUTIONS: Fall  RED FLAGS: None   WEIGHT BEARING RESTRICTIONS: No  FALLS: Has patient fallen in last 6 months? Yes. Number of falls 1  LIVING ENVIRONMENT: Lives with: lives with their family and lives in a skilled nursing facility Lives in: Other SNF  Stairs: No Has following equipment at home: Hemi walker and Wheelchair (manual)  PLOF: Requires assistive device for independence, Needs assistance with ADLs, Needs assistance with homemaking, and WC level currently   PATIENT GOALS: move better - walking or transferring with hemi walking   OBJECTIVE:  Note: Objective measures were completed at Evaluation unless otherwise noted.  DIAGNOSTIC FINDINGS:  Head CT:  IMPRESSION: 1. No acute intracranial abnormality. 2. No acute displaced fracture or traumatic listhesis of the cervical spine.  Cervical CT:  MPRESSION: 1. No acute intracranial abnormality. 2. No acute displaced fracture or traumatic listhesis of the cervical spine.  COGNITION: Overall cognitive status: History of cognitive impairments - at baseline   SENSATION: Light touch: Impaired  Proprioception: Impaired  Able to  detect depe pressure   COORDINATION: Increased tone on the RLE   EDEMA:  Mild RLE distal edema   MUSCLE TONE: RLE: Mild and Moderate  MUSCLE LENGTH: Hamstrings: grossly limited    POSTURE: rounded shoulders, forward head, and posterior bias   LOWER EXTREMITY ROM:     Active  Right Eval Left Eval  Hip flexion  Limited to 5  deg beyond 90 in sitting  Knee flexion  90deg in sitting  Knee extension  Lacking 10 deg full extension  Ankle dorsiflexion  none  Ankle plantarflexion  none   (Blank rows = not tested)  LOWER EXTREMITY MMT:    MMT Right Eval Left Eval  Hip flexion  2+  Hip extension    Hip abduction  2  Hip adduction  3  Hip internal rotation    Hip external rotation    Knee flexion  2+  Knee extension  3+  Ankle dorsiflexion  0  Ankle plantarflexion  0  Ankle inversion    Ankle eversion    (Blank rows = not tested)  BED MOBILITY:  Sit to supine Mod A Supine to sit Mod A Rolling to Right Min A Rolling to Left Min A  TRANSFERS: Assistive device utilized: Hemi walker  Sit to stand: Mod A Stand to sit: Mod A Chair to chair: Mod A Floor: unable to perform     CURB:  Level of Assistance: Total A Assistive device utilized: rail Curb Comments: unable to perform   STAIRS: Unable to perform at eval   GAIT: Gait pattern: non-functional constant posterior bias , step to pattern, decreased stance time- Right, Right hip hike, lateral hip instability, and lateral lean- Left Distance walked: 28ft Assistive device utilized: Hemi walker Level of assistance: Mod A Comments: moderate cues for sequencing of HW and gait pattern in turns   FUNCTIONAL TESTS:  5 times sit to stand: 48 sec with mod Assist from PT  Timed up and go (TUG): unable to perform  10 meter walk test: 6 ft with min-mod assist and HW.  FIST:  Function In Sitting Test (FIST)  (1/2 femur on surface; hips/knees flexed to 90deg)   - indicate bed or mat table / step stool if  used  SCORING KEY: 4 = Independent (completes task independently & successfully) 3 = Verbal Cues/Increased Time (completes task independently & successfully and only needs more time/cues) 2 = Upper Extremity Support (must use UE for support or assistance to complete successfully) 1 = Needs Assistance (unable to complete w/o physical assist; DOCUMENT LEVEL: min, mod, max) 0 = Dependent (requires complete physical assist; unable to complete successfully even w/ physical assist)  Randomly Administer Once Throughout Exam  4 - Anterior Nudge (superior sternum)  4 - Posterior Nudge (between scapular spines)  4 - Lateral Nudge (to dominant side at acromion)     4 - Static sitting (30 seconds)  4 - Sitting, shake 'no' (left and right)  4 - Sitting, eyes closed (30 seconds)   3 - Sitting, lift foot (dominant side, lift foot 1 inch twice)    4 - Pick up object from behind (object at midline, hands breadth posterior)  1 mod assist - Forward reach (use dominant arm, must complete full motion) 1 mod assist - Lateral reach (use dominant arm, clear opposite ischial tuberosity) 1 mod assist  - Pick up object from floor (from between feet)   1 mod assist - Posterior scooting (move backwards 2 inches)  1 min assist  - Anterior scooting (move forward 2 inches)  1 min assist  - Lateral scooting (move to dominant side 2 inches)    11/21/23 TOTAL = 37/56  02/13/24 TOTAL = 46/56   Notes/comments: apraxia limiting success of scooting and reacting tasks.  MCD > 5 points MCID for IP REHAB > 6 points      PATIENT SURVEYS:  To be  completed                                                                                                                               TREATMENT DATE: 02/27/2024  Seated AROM knee flexion/extension x 12 on the LLE and AAROM on the RLE x 12 Hip flexion 3 x 5 bil with AAROM on the RLE.  Hip adduction/abduction with R foot stabilized x 12 bil  Sit<>stand 2 x 5 with  min-mod assist from PT for anterior weight shift. .   Forward reverse gait with HW 2x2ft x 3 bouts min assist for forward progress, min-mod assist for reverse due to reduced knee flexion and difficulty with weight shift.   Standing forward reach with LUE to moving target 2x 10 min assist overall with mod assist x 2 in second bout. Intermittent UE support on HW between reaches   Reciprocal step over cane x 8 bil UE supported on [facility]  Gait with HW x 40ft to OT gym. Min assist overall for safety and improved weight shift to allow advancement of the RLE.   Improved HS activation noted on this day but still required intermittent assist to full weight shift over the LLE to allow advancement in RLE.    PATIENT EDUCATION: Education details: POC.  Pt educated throughout session about proper posture and technique with exercises. Improved exercise technique, movement at target joints, use of target muscles after min to mod verbal, visual, tactile cues.  Safety with transfers and ALF staff members.   Person educated: Patient and Child(ren) Education method: Explanation Education comprehension: verbalized understanding  HOME EXERCISE PROGRAM: Access Code: [email or web address] URL: [email or web address] Date: 12/05/2023 Prepared by: Aurora Lees  Exercises - Sit to Stand with Counter Support  - 1 x daily - 3-4 x weekly - 2 sets - 10 reps - Seated Long Arc Quad  - 1 x daily - 3-4 x weekly - 2 sets - 10 reps - 2 hold - Seated March  - 1 x daily - 3-4 x weekly - 2 sets - 10 reps - 2 hold - Standing Hip Abduction with Counter Support  - 1 x daily - 7 x weekly - 3 sets - 5 reps - Wide Stance with Counter Support  - 1 x daily - 7 x weekly - 3 sets - 2 reps - 30 hold - Seated Hip Abduction  - 1 x daily - 7 x weekly - 3 sets - 10 reps - Supine Bridge  - 1 x daily - 7 x weekly - 3 sets - 10 reps - Supine Short Arc Quad  - 1 x daily - 7 x weekly - 3 sets - 10 reps - Bent Knee Fallouts  - 1 x daily - 7  x weekly - 3 sets - 10 reps - Small Range Straight Leg Raise  - 1 x daily - 7 x weekly - 3 sets - 10 reps - Supine Heel Slide  -  1 x daily - 7 x weekly - 3 sets - 10 reps   GOALS: Goals reviewed with patient? Yes  SHORT TERM GOALS: Target date: 12/13/2023    Patient will be independent in home exercise program to improve strength/mobility for better functional independence with ADLs. Baseline: to be provided on visit 2: 12/28/2023- No caregiver with patient to determine if compliant with HEP Goal  5/13: daugher states that they are completing some exercises almost daily  status: IN PROGRESS   LONG TERM GOALS: Target date: 04/02/2024       2.  Patient (> 71 years old) will complete five times sit to stand test by 15 seconds and only min assist indicating an increased LE strength and improved balance. Baseline: 48 with mod assist from PT; 12/28/2023= 44 sec from EOM at lowest postion (approx 21 in) with Left UE support and CGA to min A from PT 5/13: 28.80 sec with HW from [facility]; min assist from PT for full erect standing Goal status: Progressing  3.  Patient will increase FIST score by > 6 points to demonstrate decreased fall risk during functional activities Baseline: 37 5/13: 46/56 Goal status: MET  4.  Patient will complete 10 meter walk test to with HW in less than 1 minute with min assist as to improve gait speed for better community ambulation and to reduce fall risk. Baseline: 6 ft with HW and mod assist; 12/28/2023=18 feet with HW- mod assist  5/13: 2:11 min with HW, min-mod assist.  Goal status: PROGRESSING  5.  Patient will perform stand pivot transfer to and from [facility] with HW and CGA for safety to allow safe transfer to toilet with daughter without use of rail on the wall. Baseline: mod assist and heavy use of rail. 12/28/2023= Min to Mod assist with repetitive VC for technique 5/13: min assist overall with HW; mod assist initially both directions  to prevent posterior  LOB. Goal status: Progressing  6.  Patient will improve bed mobility to min assist only to reduce care giver burden and improve safety of transfers  Baseline: mod-max assist 5/13: Min-mod assist for sit<>supine. Min assist only for RLE management for rolling Goal status: INITIAL  ASSESSMENT:  CLINICAL IMPRESSION:  Patient presents with good motivation for therapy appointment.  Continued to address tone and ROM deficits on the RLE as well as improved motor planning and coordination with functional movement patterns including transfers, and with HW. Ms Isidoro will benefit from continued skilled PT to address strength and balance deficits to improve safety for transfers, reduce care giver burden, and allow return to PLOF and possible return home from Assisted living.    OBJECTIVE IMPAIRMENTS: Abnormal gait, cardiopulmonary status limiting activity, decreased activity tolerance, decreased balance, decreased cognition, decreased coordination, decreased endurance, decreased knowledge of use of DME, decreased mobility, difficulty walking, decreased ROM, decreased strength, hypomobility, increased muscle spasms, impaired flexibility, impaired sensation, impaired tone, impaired UE functional use, improper body mechanics, and postural dysfunction.   ACTIVITY LIMITATIONS: carrying, lifting, bending, standing, squatting, stairs, transfers, bed mobility, continence, bathing, toileting, dressing, reach over head, hygiene/grooming, and locomotion level  PARTICIPATION LIMITATIONS: cleaning, interpersonal relationship, shopping, and community activity  PERSONAL FACTORS: 3+ comorbidities: HTN, chronic CVA, Afib, Lupus  are also affecting patient's functional outcome.   REHAB POTENTIAL: Fair chronic CVA with poor progress in prior interventions with this clinic  CLINICAL DECISION MAKING: Unstable/unpredictable  EVALUATION COMPLEXITY: High  PLAN:  PT FREQUENCY: 1-2x/week  PT DURATION: 12 weeks  PLANNED  INTERVENTIONS:  97110-Therapeutic exercises, 97530- Therapeutic activity, [id number]- Neuromuscular re-education, [phone]- Self Care, 60454- Manual therapy, [phone]- Gait training, [phone]- Orthotic Fit/training, [phone]- Aquatic Therapy, [phone]- Splinting, [id number]- Electrical stimulation (unattended), [phone]- Electrical stimulation (manual), Patient/Family education, Balance training, Stair training, Joint mobilization, Vestibular training, Visual/preceptual remediation/compensation, Cognitive remediation, DME instructions, Wheelchair mobility training, Cryotherapy, and Moist heat  PLAN FOR NEXT SESSION:   -  R glute activation and strength during standing  Continue  standing balance.  Gait.  Transfers with decreased UE support and awareness of posterior LOB.    Aurora Lees PT, DPT  Physical Therapist - [name]  9:06 AM 02/27/24

## 2024-02-29 ENCOUNTER — Ambulatory Visit: Payer: Medicare Other | Admitting: Speech Pathology

## 2024-02-29 ENCOUNTER — Ambulatory Visit: Payer: Medicare Other

## 2024-02-29 ENCOUNTER — Ambulatory Visit: Payer: Medicare Other | Admitting: Occupational Therapy

## 2024-03-05 ENCOUNTER — Ambulatory Visit: Payer: Medicare Other | Admitting: Occupational Therapy

## 2024-03-05 ENCOUNTER — Ambulatory Visit: Payer: Medicare Other | Admitting: Speech Pathology

## 2024-03-05 ENCOUNTER — Ambulatory Visit: Payer: Medicare Other | Attending: Physical Medicine and Rehabilitation

## 2024-03-05 DIAGNOSIS — M6281 Muscle weakness (generalized): Secondary | ICD-10-CM | POA: Diagnosis present

## 2024-03-05 DIAGNOSIS — R269 Unspecified abnormalities of gait and mobility: Secondary | ICD-10-CM | POA: Insufficient documentation

## 2024-03-05 DIAGNOSIS — R2689 Other abnormalities of gait and mobility: Secondary | ICD-10-CM | POA: Diagnosis present

## 2024-03-05 DIAGNOSIS — R2681 Unsteadiness on feet: Secondary | ICD-10-CM | POA: Insufficient documentation

## 2024-03-05 DIAGNOSIS — R262 Difficulty in walking, not elsewhere classified: Secondary | ICD-10-CM | POA: Insufficient documentation

## 2024-03-05 DIAGNOSIS — R278 Other lack of coordination: Secondary | ICD-10-CM | POA: Insufficient documentation

## 2024-03-05 DIAGNOSIS — R482 Apraxia: Secondary | ICD-10-CM | POA: Diagnosis present

## 2024-03-05 NOTE — Therapy (Signed)
 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) 09/24/2021   Cerebral embolism with cerebral infarction 09/23/2021   Pyelonephritis 09/19/2021   Pain due to onychomycosis of toenails of both feet 11/19/2020   Normocytic anemia 05/31/2020   Acute ischemic left MCA stroke (HCC) 04/30/2020   Right hemiplegia (HCC) 04/30/2020   Cerebrovascular accident (HCC) 04/21/2020   Atrial fibrillation with RVR (HCC) 04/21/2020   Essential hypertension 04/21/2020   Alcohol abuse 04/21/2020   ONSET DATE: 09/19/21  REFERRING DIAG: CVA  THERAPY DIAG:  Muscle weakness (generalized)  Rationale for Evaluation  and Treatment: Rehabilitation  SUBJECTIVE:  SUBJECTIVE STATEMENT:  Pt took transportation services to treatment this morning Pt Pt accompanied by: self, daughter  PERTINENT HISTORY: Pt. is a 68 y.o. 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 Pt. has Right sided weakness, and receptive, and expressive aphasia. Pt. has had a previous CVA with right sided weakness in July 2021. PMHx includes: AFib, Renal Artery thrombosis, situational depression, HTN, Pyelnephritis, Seizure activity, COnstipation, AKI. Vitamin D  deficiency, and urinary incontinence   PRECAUTIONS: None  WEIGHT BEARING RESTRICTIONS: No  PAIN:  Are you having pain? 0/10 Wong-Baker Facial Grimace Scale  FALLS: Has patient fallen in last 6 months? Yes. Number of falls 1-ER visit  LIVING ENVIRONMENT: Pt. resides at [facility]. Pt. has very supportive family Stairs: One level Has following equipment at home:  Equipment provided through [employer]  PLOF: Needs assistance with ADLs  PATIENT GOALS:  To improve ROM/flexibility in the RUE 2/2 increased tightness, and to improve ADL functioning in order to towards returning home.  OBJECTIVE:   Note: Objective measures were completed at Evaluation unless otherwise noted.  HAND DOMINANCE: Right  ADLs: Overall ADLs: Assist is provided from the staff at [facility]. Transfers/ambulation related to ADLs: Eating: independent, with set-up using the left hand Grooming: Independent with set-up using the left hand UB Dressing: Increased assist required, Mod-MaxA  doffing jacket LB Dressing: Increased assist required Toileting:  Staff Assists with toileting transfers, and toilet hygiene care. Bathing: Staff assists with bathing Tub Shower transfers: Requires staff assist Equipment: TBD  IADLs: Shopping: N/A Light housekeeping: Provided by staff Meal Prep: 3 Meals a day provided. Pt. Eats in the  main dining room when family visits, otherwise Pt. Eats in her room.  Community mobility: Relies on family for transportation to therapy. Per daughter- Transportation is provided for services not offered at the facility. Medication management: Provided by the facility Financial management: Daughter manages Handwriting: TBD  MOBILITY STATUS: Hx of falls  FUNCTIONAL OUTCOME MEASURES:  UPPER EXTREMITY ROM:    Passive ROM Left Eval AROM WFL Right Eval  Right 01/02/24 Right 02/08/24  Shoulder flexion  0(46) 25 (90) 0(94)  Shoulder abduction  0(64) 0 (90) 0(78)  Shoulder adduction      Shoulder extension      Shoulder internal rotation      Shoulder external rotation   (25) 0(25)  Elbow flexion  0(98) 0(131) 0(133)  Elbow extension  0(full passive) 0 (full passive) 0(full passive)  Wrist flexion  0(56) 0(90) 0(74)  Wrist extension  -56(30) 0 (40) 6(42)  Wrist ulnar deviation      Wrist radial deviation      Wrist pronation      Wrist supination      (Blank rows = not tested)  UPPER EXTREMITY MMT:     MMT Left Eval 5/5 Right eval Right  01/02/24 Right 02/08/24  Shoulder flexion  0/5 2/5  0/5  Shoulder abduction  0/5 1/5 0/5  Shoulder adduction   2-/5 1/5  Shoulder extension      Shoulder internal rotation   1/5 0/5  Shoulder external rotation      Middle trapezius      Lower trapezius      Elbow flexion  0/5 0/5 0/5  Elbow extension  0/5 2/5 1/5  Wrist flexion   1/5   Wrist extension  0/5 3-/5 1/5  Wrist ulnar deviation      Wrist radial deviation      Wrist pronation  0/5 0/5 1/5  Wrist supination  0/5 0/5 0/5  Digit flexors   2-/5 2-/5  Digit extensors   0/5 0/5  (Blank rows = not tested)  HAND FUNCTION: N/A  COORDINATION: N/A  SENSATION: Diminished  EDEMA: No edema noted in the right hand today  MUSCLE TONE: impaired  COGNITION: Overall cognitive status: Aphasia  VISION: Wears glasses, no change from baseline  PERCEPTION: TBD  PRAXIS: Impaired:  Motor planning                                                                                                                           TREATMENT DATE: 03/05/24    Manual therapy:   -Soft tissue massage to the scapular, pectoral, and right shoulder musculature. -Soft tissue mobilizations to promote radius on ulna motion needed for  supination -Soft tissue mobilizations for radius on ulna to promote supination -Pt. tolerated carpal rolls, and metacarpal spread stretches.   -Manual therapy was performed independent of, and in preparation for therapeutic Ex.  Therapeutic Exercise:   -PROM through the following joint ranges of the RUE following moist heat modality, including: shoulder flexion, abduction, ER, horizontal abduction, elbow flexion, extension, forearm supination, wrist extension, digit extension, and thumb abduction, working to increase RUE mobility to manage hemiparetic limb during basic ADLs, as well as reducing contracture risk. Janine Melbourne for R shoulder flex/ext, horizontal  abduction/adduction. -Cues, and hand over hand assist were required for self-ROM HEP for the UE.  Therapeutic Activities:  -facilitated RUE diagonal patterns of movement with hand over hand assist in preparation for self-grooming tasks.  PATIENT EDUCATION: Education details: RUE PROM/AAROM, Pt./caregiver education about possible restorative ROM programs at SNF Person educated: Patient Education method: Explanation, Demonstration, Tactile cues, and Verbal cues Education comprehension: verbal cues required, tactile cues required, and needs further education  HOME EXERCISE PROGRAM: RUE self PROM  GOALS: Goals reviewed with patient? Yes  SHORT TERM GOALS: Target date: 01/16/2024    Pt. Will demonstrate supervision with HEPs for BUEs Baseline: Eval: No current HEPs; 01/02/24: Pt demos self PROM techniques throughout the RUE with intermittent min A and mod vc for accuracy 02/08/24: Pt. requires cues for  initiation, and hand over hand assist. 02/20/24:  Goal status: in progress  LONG TERM GOALS: Target date: 05/02/2024    1.  Pt. Will improve right shoulder flexion to 45* to assist with UE dressing Baseline: Eval: Right: 0(46); 01/02/24: Right: 25 (90) 02/08/24: 0(94) 02/20/24: Limited. Assist required for UE dressing Goal status:  Ongoing  2.  Pt. Will improve with shoulder abduction by 20 degrees to assist with underarm care, and hygiene Baseline: Eval: Right 0(64); 01/02/24: Right 0 (90) 02/08/24: 0(78) 02/20/24: Limited. Assist required for underarm hygiene  Goal status: Ongoing  3.  Pt. Will improve right elbow flexion by 10 degrees to assist with hand to face patterns for grooming Baseline: Eval: Right: 0(98); 01/02/24: Right: 0(131) 02/08/24: 5(784) 02/20/24: limited 2/2 tightness Goal status: Ongoing  4.  Pt. Will elicit active wrist extension, and digit extension in preparation for rubbing lotion into the left forearm, and hand  Baseline: Eval: Right wrist: -56 (30); 01/02/24: R wrist: ext: actively to neutral/0 (passively to 40*); R digit extension: no active ext (passively [facility] for this task); unable to self apply lotion to L forearm 02/08/24: right wrist extension: 6(42) 02/20/24: Continue Goal status: I Ongoing  5.  Pt. Will perform UE dressing skills with MinA Baseline: Eval: Mod-MaxA doffing a jacket; 01/02/24: Mod A to don/doff a jacket 02/08/24: ModA to thread the right sleeve, minA to bring the shirt around the back of the neck, and donn the left sleeve. ModA to align seams. 02/20/24: ModA to thread the right sleeve, minA to bring the shirt around the back of the neck, and donn the left sleeve. ModA to align seams.  Goal status: Ongoing  6. Pt. Will require minA toileting transfers/ clothing management Baseline: Eval: Pt. requires increased assist from staff; 01/02/24: pt can transfer to standard height toilet with grab bar for support with min A, but requires mod A to transfer up from toilet; Pt  consistently performs wc<>BSC transfer with min A, extra time, and vc for sequencing while using hemi walker; total A for clothing management. 02/08/24: MinA, Total assist clothing management 02/20/24: MinA, Total assist clothing management   Goal  status: in progress  ASSESSMENT:  CLINICAL IMPRESSION:  Pt. reports no pain today. Pt. requires hand-over-hand assist for all ROM 2/2 limited activation of active muscle responses in the RUE, and hand. Pt. continues to present with increased tone, and tightness in right scapular musculature, right shoulder flexion, abduction, elbow flexion, wrist, and digit extension with MP flexion tightness limiting full MP extension. Pt. continues to benefit from OT services to normalize tone in the dominant RUE, improve ROM for underarm hygiene care and UE dressing, as well as to improve toileting care, and clothing negotiation tasks.    PERFORMANCE DEFICITS: in functional skills including ADLs, IADLs, coordination, dexterity, proprioception, sensation, ROM, strength, pain, Fine motor control, Gross motor control, balance, body mechanics, decreased knowledge of use of DME, and UE functional use, cognitive skills including problem solving, and psychosocial skills including coping strategies, environmental adaptation, and routines and behaviors.   IMPAIRMENTS: are limiting patient from ADLs, IADLs, and leisure.   CO-MORBIDITIES: may have co-morbidities  that affects occupational performance. Patient will benefit from skilled OT to address above impairments and improve overall function.  MODIFICATION OR ASSISTANCE TO COMPLETE EVALUATION: Min-Moderate modification of tasks or assist with assess necessary to complete an evaluation.  OT OCCUPATIONAL PROFILE AND HISTORY: Detailed assessment: Review of records and additional review of physical, cognitive, psychosocial history related to current functional performance.  CLINICAL DECISION MAKING: Moderate - several treatment  options, min-mod task modification necessary  REHAB POTENTIAL: Good  EVALUATION COMPLEXITY: Moderate    PLAN:  OT FREQUENCY: 1x/week  OT DURATION: 12 weeks  PLANNED INTERVENTIONS: 97168 OT Re-evaluation, 97535 self care/ADL training, 29562 therapeutic exercise, 97530 therapeutic activity, 97112 neuromuscular re-education, 97140 manual therapy, 97018 paraffin, 13086 moist heat, 97010 cryotherapy, 97034 contrast bath, passive range of motion, coping strategies training, patient/family education, and DME and/or AE instructions  RECOMMENDED OTHER SERVICES: PT, ST  CONSULTED AND AGREED WITH PLAN OF CARE: Patient  PLAN FOR NEXT SESSION: see above  Duey Ghent, MS, OTR/L   03/05/2024, 1:21 PM

## 2024-03-05 NOTE — Therapy (Signed)
 OUTPATIENT PHYSICAL THERAPY NEURO TREATMENT    Patient Name: Lindsey [name] MRN: [id number] DOB:January 08, 1956, 68 y.o., female Today's Date: 03/06/2024   PCP: Lorina Roosevelt, MD  REFERRING PROVIDER: Liam Redhead, MD   END OF SESSION:   PT End of Session - 03/05/24 0903     Visit Number 24    Number of Visits 34    Date for PT Re-Evaluation 04/02/24    Progress Note Due on Visit 30    PT Start Time 0847    PT Stop Time 0929    PT Time Calculation (min) 42 min    Equipment Utilized During Treatment Gait belt   custom R LE AFO   Activity Tolerance Patient tolerated treatment well    Behavior During Therapy WFL for tasks assessed/performed                 Past Medical History:  Diagnosis Date   Aphasia    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 No pertinent surgical history. 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   REFERRING DIAG:  Diagnosis  I69.398,R25.2 (ICD-10-CM) - Spasticity as late effect of cerebrovascular accident (CVA)    THERAPY DIAG:  Muscle weakness (generalized)  Other lack of coordination  Unsteadiness on feet  Difficulty in walking, not elsewhere classified  Abnormality of gait and mobility  Other abnormalities of gait and mobility  Rationale for Evaluation and Treatment: Rehabilitation  SUBJECTIVE:  SUBJECTIVE STATEMENT:  Patient responded Yes when asked if doing okay. She responded no when asked if having any pain and later no when asked if any falls.   Pt with aphasia, responding primarily to yes/no questions. No pain today. No family present today   From EVAL: Daughter present for Evaluation to provide hx. Pt is familiar to this clinic and has been seen for multiple bouts PT since initial CVA in 2021. Pt d/c'ed from PT serviced in April of 2024 with plans to receive PT services through [facility]. Daughter reports that she was only receiving minimal therapy in facility. Reports at least 1 fall in the last 6 months and daughter asked staff to transfer pt with lift since fall.  Daughter would want pt to demonstrate improve safety with transfers to allow bil transfers with reduced use of rail to allow pt to return home for assisted living.  Unable to discern whether pt in SNF or assisted living care from hx.    Pt accompanied by: self   PERTINENT HISTORY:  1. Acute Ischemic Left MCA Stroke:Right Hemiplegia: Ordered outpatient OT, PT, and SLP. She has had a HFU appointment with Neurology. Discussed that I can provide new scripts next month if needed. Communicated with her providers regarding positive reinforcement. She has made  excellent gains in strength as well as cognition and speech!   -Provided with new disability placard.    2. Essential Hypertension: BP is currently very elevated and discussed with patient that it would be unsafe for her to receive injections today as the injections can temporarily increase her pain, which can increase her blood pressure, and put her at risk for stroke. Discussed starting the medication Valsartan  40mg  daily and logging blood pressures daily at home and she is agreeable. Medications reviewed and she is taking Amlodipine  5mg  and lopressor .  Provided with a list of foods that will help lower her BP.  BP reviewed and improved at 136/82 -continue clonidine -Advised checking BP daily at home and logging results to bring into follow-up appointment with PCP and myself. -Reviewed BP meds today.  -Advised regarding healthy foods that can help lower blood pressure and provided with a list:  PAIN:  Are you having pain? No  PRECAUTIONS: Fall  RED FLAGS: None   WEIGHT BEARING RESTRICTIONS: No  FALLS: Has patient fallen in last 6 months? Yes. Number of falls 1  LIVING ENVIRONMENT: Lives with: lives with their family and lives in a skilled nursing facility Lives in: Other SNF  Stairs: No Has following equipment at home: Hemi walker and Wheelchair (manual)  PLOF: Requires assistive device for independence, Needs assistance with ADLs, Needs assistance with homemaking, and WC level currently   PATIENT GOALS: move better - walking or transferring with hemi walking   OBJECTIVE:  Note: Objective measures were completed at Evaluation unless otherwise noted.  DIAGNOSTIC FINDINGS:  Head CT:  IMPRESSION: 1. No acute intracranial abnormality. 2. No acute displaced fracture or traumatic listhesis of the cervical spine.  Cervical CT:  MPRESSION: 1. No acute intracranial abnormality. 2. No acute displaced fracture or traumatic listhesis of the cervical spine.  COGNITION: Overall  cognitive status: History of cognitive impairments - at baseline   SENSATION: Light touch: Impaired  Proprioception: Impaired  Able to detect depe pressure   COORDINATION: Increased tone on the RLE   EDEMA:  Mild RLE distal edema   MUSCLE TONE: RLE: Mild and Moderate  MUSCLE LENGTH: Hamstrings: grossly limited    POSTURE: rounded shoulders,  forward head, and posterior bias   LOWER EXTREMITY ROM:     Active  Right Eval Left Eval  Hip flexion  Limited to 5 deg beyond 90 in sitting  Knee flexion  90deg in sitting  Knee extension  Lacking 10 deg full extension  Ankle dorsiflexion  none  Ankle plantarflexion  none   (Blank rows = not tested)  LOWER EXTREMITY MMT:    MMT Right Eval Left Eval  Hip flexion  2+  Hip extension    Hip abduction  2  Hip adduction  3  Hip internal rotation    Hip external rotation    Knee flexion  2+  Knee extension  3+  Ankle dorsiflexion  0  Ankle plantarflexion  0  Ankle inversion    Ankle eversion    (Blank rows = not tested)  BED MOBILITY:  Sit to supine Mod A Supine to sit Mod A Rolling to Right Min A Rolling to Left Min A  TRANSFERS: Assistive device utilized: Hemi walker  Sit to stand: Mod A Stand to sit: Mod A Chair to chair: Mod A Floor: unable to perform     CURB:  Level of Assistance: Total A Assistive device utilized: rail Curb Comments: unable to perform   STAIRS: Unable to perform at eval   GAIT: Gait pattern: non-functional constant posterior bias , step to pattern, decreased stance time- Right, Right hip hike, lateral hip instability, and lateral lean- Left Distance walked: 61ft Assistive device utilized: Hemi walker Level of assistance: Mod A Comments: moderate cues for sequencing of HW and gait pattern in turns   FUNCTIONAL TESTS:  5 times sit to stand: 48 sec with mod Assist from PT  Timed up and go (TUG): unable to perform  10 meter walk test: 6 ft with min-mod assist and HW.  FIST:   Function In Sitting Test (FIST)  (1/2 femur on surface; hips/knees flexed to 90deg)   - indicate bed or mat table / step stool if used  SCORING KEY: 4 = Independent (completes task independently & successfully) 3 = Verbal Cues/Increased Time (completes task independently & successfully and only needs more time/cues) 2 = Upper Extremity Support (must use UE for support or assistance to complete successfully) 1 = Needs Assistance (unable to complete w/o physical assist; DOCUMENT LEVEL: min, mod, max) 0 = Dependent (requires complete physical assist; unable to complete successfully even w/ physical assist)  Randomly Administer Once Throughout Exam  4 - Anterior Nudge (superior sternum)  4 - Posterior Nudge (between scapular spines)  4 - Lateral Nudge (to dominant side at acromion)     4 - Static sitting (30 seconds)  4 - Sitting, shake 'no' (left and right)  4 - Sitting, eyes closed (30 seconds)   3 - Sitting, lift foot (dominant side, lift foot 1 inch twice)    4 - Pick up object from behind (object at midline, hands breadth posterior)  1 mod assist - Forward reach (use dominant arm, must complete full motion) 1 mod assist - Lateral reach (use dominant arm, clear opposite ischial tuberosity) 1 mod assist  - Pick up object from floor (from between feet)   1 mod assist - Posterior scooting (move backwards 2 inches)  1 min assist  - Anterior scooting (move forward 2 inches)  1 min assist  - Lateral scooting (move to dominant side 2 inches)    11/21/23 TOTAL = 37/56  02/13/24 TOTAL = 46/56   Notes/comments: apraxia limiting success  of scooting and reacting tasks.  MCD > 5 points MCID for IP REHAB > 6 points      PATIENT SURVEYS:  To be completed                                                                                                                               TREATMENT DATE: 03/05/2024  Therapeutic Activities: dynamic therapeutic activities  designed to achieve  improved functional performance   Sit<>stand 2 x 5 with min-mod assist from PT for anterior weight shift.   Static stand at support bar with LUE support x 1 min Dynamic weight shift Left to right with LUE Support x 25 Static stand without UE support and horizontal head turns x 15 Static stand without UE support x 1 min Dual task activities- static standing without UE Support with dynamic UE reaching for magnet letters at dry erase board arranging in pattern-  1) 2 min 45 sec 2) 3 min 30 sec 3) 3 min 17 sec Some posterior lean and LOB- requiring CGA to min assist to remain standing.     THEREX:  Seated PROM knee flex/Ext x 30 sec hold x 3 each for i Seated AAROM Right knee ext/flex 2 x 10 Seated AROM right knee ext/flex x 12 (fatigue with minimal height of kick forward)  Hip flexion  2x 10 bil with AROM on left LE and  AAROM on the RLE (excessive knee ext while marching)   PATIENT EDUCATION: Education details: POC.  Pt educated throughout session about proper posture and technique with exercises. Improved exercise technique, movement at target joints, use of target muscles after min to mod verbal, visual, tactile cues.  Safety with transfers and ALF staff members.   Person educated: Patient and Child(ren) Education method: Explanation Education comprehension: verbalized understanding  HOME EXERCISE PROGRAM: Access Code: [email or web address] URL: [email or web address] Date: 12/05/2023 Prepared by: Aurora Lees  Exercises - Sit to Stand with Counter Support  - 1 x daily - 3-4 x weekly - 2 sets - 10 reps - Seated Long Arc Quad  - 1 x daily - 3-4 x weekly - 2 sets - 10 reps - 2 hold - Seated March  - 1 x daily - 3-4 x weekly - 2 sets - 10 reps - 2 hold - Standing Hip Abduction with Counter Support  - 1 x daily - 7 x weekly - 3 sets - 5 reps - Wide Stance with Counter Support  - 1 x daily - 7 x weekly - 3 sets - 2 reps - 30 hold - Seated Hip Abduction  - 1 x daily - 7 x  weekly - 3 sets - 10 reps - Supine Bridge  - 1 x daily - 7 x weekly - 3 sets - 10 reps - Supine Short Arc Quad  - 1 x daily - 7 x weekly - 3 sets - 10 reps - Bent Knee Fallouts  - 1 x daily -  7 x weekly - 3 sets - 10 reps - Small Range Straight Leg Raise  - 1 x daily - 7 x weekly - 3 sets - 10 reps - Supine Heel Slide  - 1 x daily - 7 x weekly - 3 sets - 10 reps   GOALS: Goals reviewed with patient? Yes  SHORT TERM GOALS: Target date: 12/13/2023    Patient will be independent in home exercise program to improve strength/mobility for better functional independence with ADLs. Baseline: to be provided on visit 2: 12/28/2023- No caregiver with patient to determine if compliant with HEP Goal  5/13: daugher states that they are completing some exercises almost daily  status: IN PROGRESS   LONG TERM GOALS: Target date: 04/02/2024       2.  Patient (> 34 years old) will complete five times sit to stand test by 15 seconds and only min assist indicating an increased LE strength and improved balance. Baseline: 48 with mod assist from PT; 12/28/2023= 44 sec from EOM at lowest postion (approx 21 in) with Left UE support and CGA to min A from PT 5/13: 28.80 sec with HW from [facility]; min assist from PT for full erect standing Goal status: Progressing  3.  Patient will increase FIST score by > 6 points to demonstrate decreased fall risk during functional activities Baseline: 37 5/13: 46/56 Goal status: MET  4.  Patient will complete 10 meter walk test to with HW in less than 1 minute with min assist as to improve gait speed for better community ambulation and to reduce fall risk. Baseline: 6 ft with HW and mod assist; 12/28/2023=18 feet with HW- mod assist  5/13: 2:11 min with HW, min-mod assist.  Goal status: PROGRESSING  5.  Patient will perform stand pivot transfer to and from [facility] with HW and CGA for safety to allow safe transfer to toilet with daughter without use of rail on the wall. Baseline:  mod assist and heavy use of rail. 12/28/2023= Min to Mod assist with repetitive VC for technique 5/13: min assist overall with HW; mod assist initially both directions  to prevent posterior LOB. Goal status: Progressing  6.  Patient will improve bed mobility to min assist only to reduce care giver burden and improve safety of transfers  Baseline: mod-max assist 5/13: Min-mod assist for sit<>supine. Min assist only for RLE management for rolling Goal status: INITIAL  ASSESSMENT:  CLINICAL IMPRESSION:  Patient presents with good motivation for today's session. Focused today on ROM/flexibility and assisting patient flex and ext her knee well to assist with transfers and mobility. Then focused on standing activities including LE weight bearing while performing some dynamic UE activity. Patient performed well overall- still has some posterior LOB that she required min assist to keep from falling back but overall did well.  Ms Ditmore will benefit from continued skilled PT to address strength and balance deficits to improve safety for transfers, reduce care giver burden, and allow return to PLOF and possible return home from Assisted living.    OBJECTIVE IMPAIRMENTS: Abnormal gait, cardiopulmonary status limiting activity, decreased activity tolerance, decreased balance, decreased cognition, decreased coordination, decreased endurance, decreased knowledge of use of DME, decreased mobility, difficulty walking, decreased ROM, decreased strength, hypomobility, increased muscle spasms, impaired flexibility, impaired sensation, impaired tone, impaired UE functional use, improper body mechanics, and postural dysfunction.   ACTIVITY LIMITATIONS: carrying, lifting, bending, standing, squatting, stairs, transfers, bed mobility, continence, bathing, toileting, dressing, reach over head, hygiene/grooming, and locomotion level  PARTICIPATION LIMITATIONS: cleaning, interpersonal relationship, shopping, and community  activity  PERSONAL FACTORS: 3+ comorbidities: HTN, chronic CVA, Afib, Lupus  are also affecting patient's functional outcome.   REHAB POTENTIAL: Fair chronic CVA with poor progress in prior interventions with this clinic  CLINICAL DECISION MAKING: Unstable/unpredictable  EVALUATION COMPLEXITY: High  PLAN:  PT FREQUENCY: 1-2x/week  PT DURATION: 12 weeks  PLANNED INTERVENTIONS: 97110-Therapeutic exercises, 97530- Therapeutic activity, [id number]- Neuromuscular re-education, 97535- Self Care, 82956- Manual therapy, [phone]- Gait training, [phone]- Orthotic Fit/training, [phone]- Aquatic Therapy, [phone]- Splinting, 97014- Electrical stimulation (unattended), [phone]- Electrical stimulation (manual), Patient/Family education, Balance training, Stair training, Joint mobilization, Vestibular training, Visual/preceptual remediation/compensation, Cognitive remediation, DME instructions, Wheelchair mobility training, Cryotherapy, and Moist heat  PLAN FOR NEXT SESSION:   -  R glute activation and strength during standing  Continue  standing balance.  Gait.  Transfers with decreased UE support and awareness of posterior LOB.    Ossie Blend, PT Physical Therapist - [place]  [facility]  7:56 AM 03/06/24

## 2024-03-07 ENCOUNTER — Ambulatory Visit: Payer: Medicare Other | Admitting: Occupational Therapy

## 2024-03-07 ENCOUNTER — Ambulatory Visit: Payer: Medicare Other

## 2024-03-07 ENCOUNTER — Ambulatory Visit: Payer: Medicare Other | Admitting: Speech Pathology

## 2024-03-07 DIAGNOSIS — R2689 Other abnormalities of gait and mobility: Secondary | ICD-10-CM

## 2024-03-07 DIAGNOSIS — R278 Other lack of coordination: Secondary | ICD-10-CM

## 2024-03-07 DIAGNOSIS — M6281 Muscle weakness (generalized): Secondary | ICD-10-CM | POA: Diagnosis not present

## 2024-03-07 DIAGNOSIS — R269 Unspecified abnormalities of gait and mobility: Secondary | ICD-10-CM

## 2024-03-07 DIAGNOSIS — R2681 Unsteadiness on feet: Secondary | ICD-10-CM

## 2024-03-07 DIAGNOSIS — R262 Difficulty in walking, not elsewhere classified: Secondary | ICD-10-CM

## 2024-03-07 NOTE — Therapy (Signed)
 OUTPATIENT PHYSICAL THERAPY NEURO TREATMENT    Patient Name: Lindsey [name] MRN: [id number] DOB:07-19-1956, 68 y.o., female Today's Date: 03/07/2024   PCP: Lorina Roosevelt, MD  REFERRING PROVIDER: Liam Redhead, MD   END OF SESSION:   PT End of Session - 03/07/24 0909     Visit Number 25    Number of Visits 34    Date for PT Re-Evaluation 04/02/24    Progress Note Due on Visit 30    PT Start Time 0855    PT Stop Time 0929    PT Time Calculation (min) 34 min    Equipment Utilized During Treatment Gait belt   custom R LE AFO   Activity Tolerance Patient tolerated treatment well    Behavior During Therapy WFL for tasks assessed/performed                 Past Medical History:  Diagnosis Date   Aphasia    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 No pertinent surgical history. 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   REFERRING DIAG:  Diagnosis  I69.398,R25.2 (ICD-10-CM) - Spasticity as late effect of cerebrovascular accident (CVA)    THERAPY DIAG:  Muscle weakness (generalized)  Other lack of coordination  Unsteadiness on feet  Difficulty in walking, not elsewhere classified  Abnormality of gait and mobility  Other abnormalities of gait and mobility  Rationale for Evaluation and Treatment: Rehabilitation  SUBJECTIVE:  SUBJECTIVE STATEMENT:  Patient responded doing okay and denied any pain today. She later reported no falls.    Pt with aphasia, responding primarily to yes/no questions. No pain today. No family present today   From EVAL: Daughter present for Evaluation to provide hx. Pt is familiar to this clinic and has been seen for multiple bouts PT since initial CVA in 2021. Pt d/c'ed from PT serviced in April of 2024 with plans to receive PT services through [facility]. Daughter reports that she was only receiving minimal therapy in facility. Reports at least 1 fall in the last 6 months and daughter asked staff to transfer pt with lift since fall.  Daughter would want pt to demonstrate improve safety with transfers to allow bil transfers with reduced use of rail to allow pt to return home for assisted living.  Unable to discern whether pt in SNF or assisted living care from hx.    Pt accompanied by: self   PERTINENT HISTORY:  1. Acute Ischemic Left MCA Stroke:Right Hemiplegia: Ordered outpatient OT, PT, and SLP. She has had a HFU appointment with Neurology. Discussed that I can provide new scripts next month if needed. Communicated with her providers regarding positive reinforcement. She has made excellent gains in strength as well as cognition  and speech!   -Provided with new disability placard.    2. Essential Hypertension: BP is currently very elevated and discussed with patient that it would be unsafe for her to receive injections today as the injections can temporarily increase her pain, which can increase her blood pressure, and put her at risk for stroke. Discussed starting the medication Valsartan  40mg  daily and logging blood pressures daily at home and she is agreeable. Medications reviewed and she is taking Amlodipine  5mg  and lopressor .  Provided with a list of foods that will help lower her BP.  BP reviewed and improved at 136/82 -continue clonidine -Advised checking BP daily at home and logging results to bring into follow-up appointment with PCP and myself. -Reviewed BP meds today.  -Advised regarding healthy foods that can help lower blood pressure and provided with a list:  PAIN:  Are you having pain? No  PRECAUTIONS: Fall  RED FLAGS: None   WEIGHT BEARING RESTRICTIONS: No  FALLS: Has patient fallen in last 6 months? Yes. Number of falls 1  LIVING ENVIRONMENT: Lives with: lives with their family and lives in a skilled nursing facility Lives in: Other SNF  Stairs: No Has following equipment at home: Hemi walker and Wheelchair (manual)  PLOF: Requires assistive device for independence, Needs assistance with ADLs, Needs assistance with homemaking, and WC level currently   PATIENT GOALS: move better - walking or transferring with hemi walking   OBJECTIVE:  Note: Objective measures were completed at Evaluation unless otherwise noted.  DIAGNOSTIC FINDINGS:  Head CT:  IMPRESSION: 1. No acute intracranial abnormality. 2. No acute displaced fracture or traumatic listhesis of the cervical spine.  Cervical CT:  MPRESSION: 1. No acute intracranial abnormality. 2. No acute displaced fracture or traumatic listhesis of the cervical spine.  COGNITION: Overall cognitive status: History of cognitive  impairments - at baseline   SENSATION: Light touch: Impaired  Proprioception: Impaired  Able to detect depe pressure   COORDINATION: Increased tone on the RLE   EDEMA:  Mild RLE distal edema   MUSCLE TONE: RLE: Mild and Moderate  MUSCLE LENGTH: Hamstrings: grossly limited    POSTURE: rounded shoulders, forward head, and posterior bias   LOWER EXTREMITY ROM:  Active  Right Eval Left Eval  Hip flexion  Limited to 5 deg beyond 90 in sitting  Knee flexion  90deg in sitting  Knee extension  Lacking 10 deg full extension  Ankle dorsiflexion  none  Ankle plantarflexion  none   (Blank rows = not tested)  LOWER EXTREMITY MMT:    MMT Right Eval Left Eval  Hip flexion  2+  Hip extension    Hip abduction  2  Hip adduction  3  Hip internal rotation    Hip external rotation    Knee flexion  2+  Knee extension  3+  Ankle dorsiflexion  0  Ankle plantarflexion  0  Ankle inversion    Ankle eversion    (Blank rows = not tested)  BED MOBILITY:  Sit to supine Mod A Supine to sit Mod A Rolling to Right Min A Rolling to Left Min A  TRANSFERS: Assistive device utilized: Hemi walker  Sit to stand: Mod A Stand to sit: Mod A Chair to chair: Mod A Floor: unable to perform     CURB:  Level of Assistance: Total A Assistive device utilized: rail Curb Comments: unable to perform   STAIRS: Unable to perform at eval   GAIT: Gait pattern: non-functional constant posterior bias , step to pattern, decreased stance time- Right, Right hip hike, lateral hip instability, and lateral lean- Left Distance walked: 41ft Assistive device utilized: Hemi walker Level of assistance: Mod A Comments: moderate cues for sequencing of HW and gait pattern in turns   FUNCTIONAL TESTS:  5 times sit to stand: 48 sec with mod Assist from PT  Timed up and go (TUG): unable to perform  10 meter walk test: 6 ft with min-mod assist and HW.  FIST:  Function In Sitting Test (FIST)  (1/2 femur  on surface; hips/knees flexed to 90deg)   - indicate bed or mat table / step stool if used  SCORING KEY: 4 = Independent (completes task independently & successfully) 3 = Verbal Cues/Increased Time (completes task independently & successfully and only needs more time/cues) 2 = Upper Extremity Support (must use UE for support or assistance to complete successfully) 1 = Needs Assistance (unable to complete w/o physical assist; DOCUMENT LEVEL: min, mod, max) 0 = Dependent (requires complete physical assist; unable to complete successfully even w/ physical assist)  Randomly Administer Once Throughout Exam  4 - Anterior Nudge (superior sternum)  4 - Posterior Nudge (between scapular spines)  4 - Lateral Nudge (to dominant side at acromion)     4 - Static sitting (30 seconds)  4 - Sitting, shake 'no' (left and right)  4 - Sitting, eyes closed (30 seconds)   3 - Sitting, lift foot (dominant side, lift foot 1 inch twice)    4 - Pick up object from behind (object at midline, hands breadth posterior)  1 mod assist - Forward reach (use dominant arm, must complete full motion) 1 mod assist - Lateral reach (use dominant arm, clear opposite ischial tuberosity) 1 mod assist  - Pick up object from floor (from between feet)   1 mod assist - Posterior scooting (move backwards 2 inches)  1 min assist  - Anterior scooting (move forward 2 inches)  1 min assist  - Lateral scooting (move to dominant side 2 inches)    11/21/23 TOTAL = 37/56  02/13/24 TOTAL = 46/56   Notes/comments: apraxia limiting success of scooting and reacting tasks.  MCD > 5 points MCID for IP REHAB >  6 points      PATIENT SURVEYS:  To be completed                                                                                                                               TREATMENT DATE: 03/07/2024  Therapeutic Activities: dynamic therapeutic activities  designed to achieve improved functional performance   Sit<>stand 2  x 5 with min-mod assist from PT for anterior weight shift.  Stand pivot transfers from w/c - stand to support bar then pivot 90 deg to turn and sit into regular straight back chair x 5.  Standing at support bar- focusing on RLE movement- forward and backward LE swing (to advance LE with forward walking and walking back to chair) x 10 reps each Side. (Max difficulty with retro step RLE due to increased right side gluteal weakness)   Static stand at support bar without UE support x 5 min mixed with some intermittent lateral weight shift with no right knee buckling     PATIENT EDUCATION: Education details: POC.  Pt educated throughout session about proper posture and technique with exercises. Improved exercise technique, movement at target joints, use of target muscles after min to mod verbal, visual, tactile cues.  Safety with transfers and ALF staff members.   Person educated: Patient and Child(ren) Education method: Explanation Education comprehension: verbalized understanding  HOME EXERCISE PROGRAM: Access Code: [email or web address] URL: [email or web address] Date: 12/05/2023 Prepared by: Aurora Lees  Exercises - Sit to Stand with Counter Support  - 1 x daily - 3-4 x weekly - 2 sets - 10 reps - Seated Long Arc Quad  - 1 x daily - 3-4 x weekly - 2 sets - 10 reps - 2 hold - Seated March  - 1 x daily - 3-4 x weekly - 2 sets - 10 reps - 2 hold - Standing Hip Abduction with Counter Support  - 1 x daily - 7 x weekly - 3 sets - 5 reps - Wide Stance with Counter Support  - 1 x daily - 7 x weekly - 3 sets - 2 reps - 30 hold - Seated Hip Abduction  - 1 x daily - 7 x weekly - 3 sets - 10 reps - Supine Bridge  - 1 x daily - 7 x weekly - 3 sets - 10 reps - Supine Short Arc Quad  - 1 x daily - 7 x weekly - 3 sets - 10 reps - Bent Knee Fallouts  - 1 x daily - 7 x weekly - 3 sets - 10 reps - Small Range Straight Leg Raise  - 1 x daily - 7 x weekly - 3 sets - 10 reps - Supine Heel Slide  - 1 x  daily - 7 x weekly - 3 sets - 10 reps   GOALS: Goals reviewed with patient? Yes  SHORT TERM GOALS: Target date: 12/13/2023    Patient will be independent in home exercise program to  improve strength/mobility for better functional independence with ADLs. Baseline: to be provided on visit 2: 12/28/2023- No caregiver with patient to determine if compliant with HEP Goal  5/13: daugher states that they are completing some exercises almost daily  status: IN PROGRESS   LONG TERM GOALS: Target date: 04/02/2024       2.  Patient (> 43 years old) will complete five times sit to stand test by 15 seconds and only min assist indicating an increased LE strength and improved balance. Baseline: 48 with mod assist from PT; 12/28/2023= 44 sec from EOM at lowest postion (approx 21 in) with Left UE support and CGA to min A from PT 5/13: 28.80 sec with HW from [facility]; min assist from PT for full erect standing Goal status: Progressing  3.  Patient will increase FIST score by > 6 points to demonstrate decreased fall risk during functional activities Baseline: 37 5/13: 46/56 Goal status: MET  4.  Patient will complete 10 meter walk test to with HW in less than 1 minute with min assist as to improve gait speed for better community ambulation and to reduce fall risk. Baseline: 6 ft with HW and mod assist; 12/28/2023=18 feet with HW- mod assist  5/13: 2:11 min with HW, min-mod assist.  Goal status: PROGRESSING  5.  Patient will perform stand pivot transfer to and from [facility] with HW and CGA for safety to allow safe transfer to toilet with daughter without use of rail on the wall. Baseline: mod assist and heavy use of rail. 12/28/2023= Min to Mod assist with repetitive VC for technique 5/13: min assist overall with HW; mod assist initially both directions  to prevent posterior LOB. Goal status: Progressing  6.  Patient will improve bed mobility to min assist only to reduce care giver burden and improve safety of  transfers  Baseline: mod-max assist 5/13: Min-mod assist for sit<>supine. Min assist only for RLE management for rolling Goal status: INITIAL  ASSESSMENT:  CLINICAL IMPRESSION:  Treatment limited secondary to patient late arrival- She exhibited some difficulty with motor planning- inconsistent with Transfer technique requiring VC and min assist to stand at times. She exhibited significant difficulty pivoting to right using her Right LE to advance and turn. She did well later with standing without UE Support and able to lateral weight shift today without LOB or reaching for UE support.   Ms Mangal will benefit from continued skilled PT to address strength and balance deficits to improve safety for transfers, reduce care giver burden, and allow return to PLOF and possible return home from Assisted living.    OBJECTIVE IMPAIRMENTS: Abnormal gait, cardiopulmonary status limiting activity, decreased activity tolerance, decreased balance, decreased cognition, decreased coordination, decreased endurance, decreased knowledge of use of DME, decreased mobility, difficulty walking, decreased ROM, decreased strength, hypomobility, increased muscle spasms, impaired flexibility, impaired sensation, impaired tone, impaired UE functional use, improper body mechanics, and postural dysfunction.   ACTIVITY LIMITATIONS: carrying, lifting, bending, standing, squatting, stairs, transfers, bed mobility, continence, bathing, toileting, dressing, reach over head, hygiene/grooming, and locomotion level  PARTICIPATION LIMITATIONS: cleaning, interpersonal relationship, shopping, and community activity  PERSONAL FACTORS: 3+ comorbidities: HTN, chronic CVA, Afib, Lupus  are also affecting patient's functional outcome.   REHAB POTENTIAL: Fair chronic CVA with poor progress in prior interventions with this clinic  CLINICAL DECISION MAKING: Unstable/unpredictable  EVALUATION COMPLEXITY: High  PLAN:  PT FREQUENCY:  1-2x/week  PT DURATION: 12 weeks  PLANNED INTERVENTIONS: 97110-Therapeutic exercises, 97530- Therapeutic activity, [id number]- Neuromuscular re-education, 97535-  Self Care, 16109- Manual therapy, [phone]- Gait training, [phone]- Orthotic Fit/training, [phone]- Aquatic Therapy, [phone]- Splinting, [id number]- Electrical stimulation (unattended), [phone]- Electrical stimulation (manual), Patient/Family education, Balance training, Stair training, Joint mobilization, Vestibular training, Visual/preceptual remediation/compensation, Cognitive remediation, DME instructions, Wheelchair mobility training, Cryotherapy, and Moist heat  PLAN FOR NEXT SESSION:   -  R glute activation and strength during standing  Continue  standing balance.  Gait.  Transfers with decreased UE support and awareness of posterior LOB.    Ossie Blend, PT Physical Therapist - [facility]  9:48 AM 03/07/24

## 2024-03-12 ENCOUNTER — Ambulatory Visit: Payer: Medicare Other | Admitting: Speech Pathology

## 2024-03-12 ENCOUNTER — Ambulatory Visit: Payer: Medicare Other

## 2024-03-12 ENCOUNTER — Ambulatory Visit: Payer: Medicare Other | Admitting: Occupational Therapy

## 2024-03-12 DIAGNOSIS — R278 Other lack of coordination: Secondary | ICD-10-CM

## 2024-03-12 DIAGNOSIS — M6281 Muscle weakness (generalized): Secondary | ICD-10-CM

## 2024-03-12 DIAGNOSIS — R2681 Unsteadiness on feet: Secondary | ICD-10-CM

## 2024-03-12 NOTE — Therapy (Signed)
 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) 09/24/2021   Cerebral embolism with cerebral infarction 09/23/2021   Pyelonephritis 09/19/2021   Pain due to onychomycosis of toenails of both feet 11/19/2020   Normocytic anemia 05/31/2020   Acute ischemic left MCA stroke (HCC) 04/30/2020   Right hemiplegia (HCC) 04/30/2020   Cerebrovascular accident (HCC) 04/21/2020   Atrial fibrillation with RVR (HCC) 04/21/2020   Essential hypertension 04/21/2020   Alcohol abuse 04/21/2020   ONSET DATE: 09/19/21  REFERRING DIAG: CVA  THERAPY DIAG:  Muscle weakness (generalized)  Rationale for Evaluation  and Treatment: Rehabilitation  SUBJECTIVE:  SUBJECTIVE STATEMENT:  Pt took transportation services to treatment this morning  Pt Pt accompanied by: self, daughter  PERTINENT HISTORY: Pt. is a 68 y.o. 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 Pt. has Right sided weakness, and receptive, and expressive aphasia. Pt. has had a previous CVA with right sided weakness in July 2021. PMHx includes: AFib, Renal Artery thrombosis, situational depression, HTN, Pyelnephritis, Seizure activity, COnstipation, AKI. Vitamin D  deficiency, and urinary incontinence   PRECAUTIONS: None  WEIGHT BEARING RESTRICTIONS: No  PAIN:  Are you having pain? 0-2/10 Wong-Baker Facial Grimace Scale  FALLS: Has patient fallen in last 6 months? Yes. Number of falls 1-ER visit  LIVING ENVIRONMENT: Pt. resides at [facility]. Pt. has very supportive family Stairs: One level Has following equipment at home:  Equipment provided through [employer]  PLOF: Needs assistance with ADLs  PATIENT GOALS:  To improve ROM/flexibility in the RUE 2/2 increased tightness, and to improve ADL functioning in order to towards returning home.  OBJECTIVE:   Note: Objective measures were completed at Evaluation unless otherwise noted.  HAND DOMINANCE: Right  ADLs: Overall ADLs: Assist is provided from the staff at [facility]. Transfers/ambulation related to ADLs: Eating: independent, with set-up using the left hand Grooming: Independent with set-up using the left hand UB Dressing: Increased assist required,  Mod-MaxA doffing jacket LB Dressing: Increased assist required Toileting:  Staff Assists with toileting transfers, and toilet hygiene care. Bathing: Staff assists with bathing Tub Shower transfers: Requires staff assist Equipment: TBD  IADLs: Shopping: N/A Light housekeeping: Provided by staff Meal Prep: 3 Meals a day provided. Pt. Eats in  the main dining room when family visits, otherwise Pt. Eats in her room.  Community mobility: Relies on family for transportation to therapy. Per daughter- Transportation is provided for services not offered at the facility. Medication management: Provided by the facility Financial management: Daughter manages Handwriting: TBD  MOBILITY STATUS: Hx of falls  FUNCTIONAL OUTCOME MEASURES:  UPPER EXTREMITY ROM:    Passive ROM Left Eval AROM WFL Right Eval  Right 01/02/24 Right 02/08/24  Shoulder flexion  0(46) 25 (90) 0(94)  Shoulder abduction  0(64) 0 (90) 0(78)  Shoulder adduction      Shoulder extension      Shoulder internal rotation      Shoulder external rotation   (25) 0(25)  Elbow flexion  0(98) 0(131) 0(133)  Elbow extension  0(full passive) 0 (full passive) 0(full passive)  Wrist flexion  0(56) 0(90) 0(74)  Wrist extension  -56(30) 0 (40) 6(42)  Wrist ulnar deviation      Wrist radial deviation      Wrist pronation      Wrist supination      (Blank rows = not tested)  UPPER EXTREMITY MMT:     MMT Left Eval 5/5 Right eval Right  01/02/24 Right 02/08/24  Shoulder flexion  0/5 2/5  0/5  Shoulder abduction  0/5 1/5 0/5  Shoulder adduction   2-/5 1/5  Shoulder extension      Shoulder internal rotation   1/5 0/5  Shoulder external rotation      Middle trapezius      Lower trapezius      Elbow flexion  0/5 0/5 0/5  Elbow extension  0/5 2/5 1/5  Wrist flexion   1/5   Wrist extension  0/5 3-/5 1/5  Wrist ulnar deviation      Wrist radial deviation      Wrist pronation  0/5 0/5 1/5  Wrist supination  0/5 0/5 0/5  Digit flexors   2-/5 2-/5  Digit extensors   0/5 0/5  (Blank rows = not tested)  HAND FUNCTION: N/A  COORDINATION: N/A  SENSATION: Diminished  EDEMA: No edema noted in the right hand today  MUSCLE TONE: impaired  COGNITION: Overall cognitive status: Aphasia  VISION: Wears glasses, no change from baseline  PERCEPTION: TBD  PRAXIS:  Impaired: Motor planning                                                                                                                           TREATMENT DATE: 03/12/24    Manual therapy:   -Soft tissue massage to the scapular, pectoral, and right shoulder musculature. -Soft tissue mobilizations to promote radius on ulna motion needed  for supination -Soft tissue mobilizations for radius on ulna to promote supination -Pt. tolerated carpal rolls, and metacarpal spread stretches.   -Manual therapy was performed independent of, and in preparation for therapeutic Ex.  Therapeutic Exercise:   -PROM through the following joint ranges of the RUE following moist heat modality, including: shoulder flexion, abduction, ER, horizontal abduction, elbow flexion, extension, forearm supination, wrist extension, digit extension, and thumb abduction, working to increase RUE mobility to manage hemiparetic limb during basic ADLs, as well as reducing contracture risk. Janine Melbourne for R shoulder flex/ext, horizontal  abduction/adduction. -Cues, and hand over hand assist were required for self-ROM HEP for the UE.  Therapeutic Activities:  -facilitated RUE diagonal patterns of movement with hand over hand assist in preparation for self-grooming tasks.  PATIENT EDUCATION: Education details: RUE PROM/AAROM, Pt./caregiver education about possible restorative ROM programs at SNF Person educated: Patient Education method: Explanation, Demonstration, Tactile cues, and Verbal cues Education comprehension: verbal cues required, tactile cues required, and needs further education  HOME EXERCISE PROGRAM: RUE self PROM  GOALS: Goals reviewed with patient? Yes  SHORT TERM GOALS: Target date: 01/16/2024    Pt. Will demonstrate supervision with HEPs for BUEs Baseline: Eval: No current HEPs; 01/02/24: Pt demos self PROM techniques throughout the RUE with intermittent min A and mod vc for accuracy 02/08/24: Pt. requires cues  for initiation, and hand over hand assist. 02/20/24:  Goal status: in progress  LONG TERM GOALS: Target date: 05/02/2024    1.  Pt. Will improve right shoulder flexion to 45* to assist with UE dressing Baseline: Eval: Right: 0(46); 01/02/24: Right: 25 (90) 02/08/24: 0(94) 02/20/24: Limited. Assist required for UE dressing Goal status:  Ongoing  2.  Pt. Will improve with shoulder abduction by 20 degrees to assist with underarm care, and hygiene Baseline: Eval: Right 0(64); 01/02/24: Right 0 (90) 02/08/24: 0(78) 02/20/24: Limited. Assist required for underarm hygiene  Goal status: Ongoing  3.  Pt. Will improve right elbow flexion by 10 degrees to assist with hand to face patterns for grooming Baseline: Eval: Right: 0(98); 01/02/24: Right: 0(131) 02/08/24: 5(366) 02/20/24: limited 2/2 tightness Goal status: Ongoing  4.  Pt. Will elicit active wrist extension, and digit extension in preparation for rubbing lotion into the left forearm, and hand  Baseline: Eval: Right wrist: -56 (30); 01/02/24: R wrist: ext: actively to neutral/0 (passively to 40*); R digit extension: no active ext (passively [facility] for this task); unable to self apply lotion to L forearm 02/08/24: right wrist extension: 6(42) 02/20/24: Continue Goal status: I Ongoing  5.  Pt. Will perform UE dressing skills with MinA Baseline: Eval: Mod-MaxA doffing a jacket; 01/02/24: Mod A to don/doff a jacket 02/08/24: ModA to thread the right sleeve, minA to bring the shirt around the back of the neck, and donn the left sleeve. ModA to align seams. 02/20/24: ModA to thread the right sleeve, minA to bring the shirt around the back of the neck, and donn the left sleeve. ModA to align seams.  Goal status: Ongoing  6. Pt. Will require minA toileting transfers/ clothing management Baseline: Eval: Pt. requires increased assist from staff; 01/02/24: pt can transfer to standard height toilet with grab bar for support with min A, but requires mod A to transfer up from  toilet; Pt consistently performs wc<>BSC transfer with min A, extra time, and vc for sequencing while using hemi walker; total A for clothing management. 02/08/24: MinA, Total assist clothing management 02/20/24: MinA, Total assist clothing management  Goal status: in progress  ASSESSMENT:  CLINICAL IMPRESSION:  Pt. continues to require hand-over-hand assist for all ROM 2/2 limited activation of active muscle responses in the RUE, and hand. Pt. continues to present with increased tone, and tightness in right scapular musculature, right shoulder flexion, abduction, elbow flexion, wrist, and digit extension with MP flexion tightness limiting full MP extension. Pt. continues to benefit from OT services to normalize tone in the dominant RUE, improve ROM for underarm hygiene care and UE dressing, as well as to improve toileting care, and clothing negotiation tasks.    PERFORMANCE DEFICITS: in functional skills including ADLs, IADLs, coordination, dexterity, proprioception, sensation, ROM, strength, pain, Fine motor control, Gross motor control, balance, body mechanics, decreased knowledge of use of DME, and UE functional use, cognitive skills including problem solving, and psychosocial skills including coping strategies, environmental adaptation, and routines and behaviors.   IMPAIRMENTS: are limiting patient from ADLs, IADLs, and leisure.   CO-MORBIDITIES: may have co-morbidities  that affects occupational performance. Patient will benefit from skilled OT to address above impairments and improve overall function.  MODIFICATION OR ASSISTANCE TO COMPLETE EVALUATION: Min-Moderate modification of tasks or assist with assess necessary to complete an evaluation.  OT OCCUPATIONAL PROFILE AND HISTORY: Detailed assessment: Review of records and additional review of physical, cognitive, psychosocial history related to current functional performance.  CLINICAL DECISION MAKING: Moderate - several treatment  options, min-mod task modification necessary  REHAB POTENTIAL: Good  EVALUATION COMPLEXITY: Moderate    PLAN:  OT FREQUENCY: 1x/week  OT DURATION: 12 weeks  PLANNED INTERVENTIONS: 97168 OT Re-evaluation, 97535 self care/ADL training, 16109 therapeutic exercise, 97530 therapeutic activity, 97112 neuromuscular re-education, 97140 manual therapy, 97018 paraffin, 60454 moist heat, 97010 cryotherapy, 97034 contrast bath, passive range of motion, coping strategies training, patient/family education, and DME and/or AE instructions  RECOMMENDED OTHER SERVICES: PT, ST  CONSULTED AND AGREED WITH PLAN OF CARE: Patient  PLAN FOR NEXT SESSION: see above  Duey Ghent, MS, OTR/L   03/12/2024, 12:10 PM

## 2024-03-12 NOTE — Therapy (Signed)
 OUTPATIENT PHYSICAL THERAPY NEURO TREATMENT    Patient Name: Lindsey [name] MRN: [id number] DOB:01/28/1956, 68 y.o., female Today's Date: 03/12/2024   PCP: Lorina Roosevelt, MD  REFERRING PROVIDER: Liam Redhead, MD   END OF SESSION:   PT End of Session - 03/12/24 0955     Visit Number 26    Number of Visits 34    Date for PT Re-Evaluation 04/02/24    Progress Note Due on Visit 30    PT Start Time 0851    PT Stop Time 0930    PT Time Calculation (min) 39 min    Equipment Utilized During Treatment Gait belt   custom R LE AFO   Activity Tolerance Patient tolerated treatment well    Behavior During Therapy WFL for tasks assessed/performed                  Past Medical History:  Diagnosis Date   Aphasia    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 No pertinent surgical history. 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   REFERRING DIAG:  Diagnosis  I69.398,R25.2 (ICD-10-CM) - Spasticity as late effect of cerebrovascular accident (CVA)    THERAPY DIAG:  Muscle weakness (generalized)  Unsteadiness on feet  Other lack of coordination  Rationale for Evaluation and Treatment: Rehabilitation  SUBJECTIVE:  SUBJECTIVE STATEMENT:  03/12/24 Pt answers yes/no: reports no stumbles/falls, and no pain. Pt reports no to question about medication changes or other updates since last seen. No family present today   From EVAL: Daughter present for Evaluation to provide hx. Pt is familiar to this clinic and has been seen for multiple bouts PT since initial CVA in 2021. Pt d/c'ed from PT serviced in April of 2024 with plans to receive PT services through [facility]. Daughter reports that she was only receiving minimal therapy in facility. Reports at least 1 fall in the last 6 months and daughter asked staff to transfer pt with lift since fall.  Daughter would want pt to demonstrate improve safety with transfers to allow bil transfers with reduced use of rail to allow pt to return home for assisted living.  Unable to discern whether pt in SNF or assisted living care from hx.    Pt accompanied by: self   PERTINENT HISTORY:  1. Acute Ischemic Left MCA Stroke:Right Hemiplegia: Ordered outpatient OT, PT, and SLP. She has had a HFU appointment with Neurology. Discussed that I can provide new scripts next month if needed. Communicated with her providers regarding positive reinforcement. She has made excellent gains in strength as well as cognition and speech!   -Provided with new disability placard.    2. Essential Hypertension: BP is currently very elevated and discussed with  patient that it would be unsafe for her to receive injections today as the injections can temporarily increase her pain, which can increase her blood pressure, and put her at risk for stroke. Discussed starting the medication Valsartan  40mg  daily and logging blood pressures daily at home and she is agreeable. Medications reviewed and she is taking Amlodipine  5mg  and lopressor .  Provided with a list of foods that will help lower her BP.  BP reviewed and improved at 136/82 -continue clonidine -Advised checking BP daily at home and logging results to bring into follow-up appointment with PCP and myself. -Reviewed BP meds today.  -Advised regarding healthy foods that can help lower blood pressure and provided with a list:  PAIN:  Are you having pain? No  PRECAUTIONS: Fall  RED FLAGS: None   WEIGHT BEARING RESTRICTIONS: No  FALLS: Has patient fallen in last 6 months? Yes. Number of falls 1  LIVING ENVIRONMENT: Lives with: lives with their family and lives in a skilled nursing facility Lives in: Other SNF  Stairs: No Has following equipment at home: Hemi walker and Wheelchair (manual)  PLOF: Requires assistive device for independence, Needs assistance with ADLs, Needs assistance with homemaking, and WC level currently   PATIENT GOALS: move better - walking or transferring with hemi walking   OBJECTIVE:  Note: Objective measures were completed at Evaluation unless otherwise noted.  DIAGNOSTIC FINDINGS:  Head CT:  IMPRESSION: 1. No acute intracranial abnormality. 2. No acute displaced fracture or traumatic listhesis of the cervical spine.  Cervical CT:  MPRESSION: 1. No acute intracranial abnormality. 2. No acute displaced fracture or traumatic listhesis of the cervical spine.  COGNITION: Overall cognitive status: History of cognitive impairments - at baseline   SENSATION: Light touch: Impaired  Proprioception: Impaired  Able to detect depe pressure    COORDINATION: Increased tone on the RLE   EDEMA:  Mild RLE distal edema   MUSCLE TONE: RLE: Mild and Moderate  MUSCLE LENGTH: Hamstrings: grossly limited    POSTURE: rounded shoulders, forward head, and posterior bias   LOWER EXTREMITY ROM:  Active  Right Eval Left Eval  Hip flexion  Limited to 5 deg beyond 90 in sitting  Knee flexion  90deg in sitting  Knee extension  Lacking 10 deg full extension  Ankle dorsiflexion  none  Ankle plantarflexion  none   (Blank rows = not tested)  LOWER EXTREMITY MMT:    MMT Right Eval Left Eval  Hip flexion  2+  Hip extension    Hip abduction  2  Hip adduction  3  Hip internal rotation    Hip external rotation    Knee flexion  2+  Knee extension  3+  Ankle dorsiflexion  0  Ankle plantarflexion  0  Ankle inversion    Ankle eversion    (Blank rows = not tested)  BED MOBILITY:  Sit to supine Mod A Supine to sit Mod A Rolling to Right Min A Rolling to Left Min A  TRANSFERS: Assistive device utilized: Hemi walker  Sit to stand: Mod A Stand to sit: Mod A Chair to chair: Mod A Floor: unable to perform     CURB:  Level of Assistance: Total A Assistive device utilized: rail Curb Comments: unable to perform   STAIRS: Unable to perform at eval   GAIT: Gait pattern: non-functional constant posterior bias , step to pattern, decreased stance time- Right, Right hip hike, lateral hip instability, and lateral lean- Left Distance walked: 42ft Assistive device utilized: Hemi walker Level of assistance: Mod A Comments: moderate cues for sequencing of HW and gait pattern in turns   FUNCTIONAL TESTS:  5 times sit to stand: 48 sec with mod Assist from PT  Timed up and go (TUG): unable to perform  10 meter walk test: 6 ft with min-mod assist and HW.  FIST:  Function In Sitting Test (FIST)  (1/2 femur on surface; hips/knees flexed to 90deg)   - indicate bed or mat table / step stool if used  SCORING KEY: 4 =  Independent (completes task independently & successfully) 3 = Verbal Cues/Increased Time (completes task independently & successfully and only needs more time/cues) 2 = Upper Extremity Support (must use UE for support or assistance to complete successfully) 1 = Needs Assistance (unable to complete w/o physical assist; DOCUMENT LEVEL: min, mod, max) 0 = Dependent (requires complete physical assist; unable to complete successfully even w/ physical assist)  Randomly Administer Once Throughout Exam  4 - Anterior Nudge (superior sternum)  4 - Posterior Nudge (between scapular spines)  4 - Lateral Nudge (to dominant side at acromion)     4 - Static sitting (30 seconds)  4 - Sitting, shake 'no' (left and right)  4 - Sitting, eyes closed (30 seconds)   3 - Sitting, lift foot (dominant side, lift foot 1 inch twice)    4 - Pick up object from behind (object at midline, hands breadth posterior)  1 mod assist - Forward reach (use dominant arm, must complete full motion) 1 mod assist - Lateral reach (use dominant arm, clear opposite ischial tuberosity) 1 mod assist  - Pick up object from floor (from between feet)   1 mod assist - Posterior scooting (move backwards 2 inches)  1 min assist  - Anterior scooting (move forward 2 inches)  1 min assist  - Lateral scooting (move to dominant side 2 inches)    11/21/23 TOTAL = 37/56  02/13/24 TOTAL = 46/56   Notes/comments: apraxia limiting success of scooting and reacting tasks.  MCD > 5 points MCID for IP REHAB >  6 points      PATIENT SURVEYS:  To be completed                                                                                                                               TREATMENT DATE: 03/07/2024   TE:   Seated:  March 2x12 each LE - TC to improve AROM  LAQ 2x12 each LE  RLE knee flexion with pillowcase slide (with PT assist so pt also achieves quad stretch) 10x   Seated adductor squeeze with p.ball 4x10 - getting activation  almost exclusively from L side    Therapeutic Activities: dynamic therapeutic activities  designed to achieve improved functional performance       Sit<>stand 1 x 4 with CGA-min assist from PT for anterior weight shift with use of UE to pull to stand   Sustained standing for endurance/strength with attempt at neutral gaze then lower into chair with use of UE on arm-rest instead of bar    Sit<>stand 1 x 5 with CGA-min assist from PT for anterior weight shift with use of UE to pull to stand    Attempt sustained standing for endurance/strength but pt slightly more fatigued, requires min assist back into chair due to unsteadiness     NMR   Static stand at support bar with intermittent UE support, first practice with neutral gaze, then head turns R and L for balance challenge 1 round x 2 min, 1x 3 min- PT provides up to min assist and cuing to correct balance       PATIENT EDUCATION: Education details: .  Pt educated throughout session about proper posture and technique with exercises. Improved exercise technique, movement at target joints, use of target muscles after min to mod verbal, visual, tactile cues  Person educated: Patient Education method: Explanation Education comprehension: verbalized understanding  HOME EXERCISE PROGRAM: Access Code: [email or web address] URL: [email or web address] Date: 12/05/2023 Prepared by: Aurora Lees  Exercises - Sit to Stand with Counter Support  - 1 x daily - 3-4 x weekly - 2 sets - 10 reps - Seated Long Arc Quad  - 1 x daily - 3-4 x weekly - 2 sets - 10 reps - 2 hold - Seated March  - 1 x daily - 3-4 x weekly - 2 sets - 10 reps - 2 hold - Standing Hip Abduction with Counter Support  - 1 x daily - 7 x weekly - 3 sets - 5 reps - Wide Stance with Counter Support  - 1 x daily - 7 x weekly - 3 sets - 2 reps - 30 hold - Seated Hip Abduction  - 1 x daily - 7 x weekly - 3 sets - 10 reps - Supine Bridge  - 1 x daily - 7 x weekly - 3 sets - 10 reps -  Supine Short Arc Quad  - 1 x daily - 7 x weekly - 3 sets - 10 reps - Bent  Knee Fallouts  - 1 x daily - 7 x weekly - 3 sets - 10 reps - Small Range Straight Leg Raise  - 1 x daily - 7 x weekly - 3 sets - 10 reps - Supine Heel Slide  - 1 x daily - 7 x weekly - 3 sets - 10 reps   GOALS: Goals reviewed with patient? Yes  SHORT TERM GOALS: Target date: 12/13/2023    Patient will be independent in home exercise program to improve strength/mobility for better functional independence with ADLs. Baseline: to be provided on visit 2: 12/28/2023- No caregiver with patient to determine if compliant with HEP Goal  5/13: daugher states that they are completing some exercises almost daily  status: IN PROGRESS   LONG TERM GOALS: Target date: 04/02/2024       2.  Patient (> 23 years old) will complete five times sit to stand test by 15 seconds and only min assist indicating an increased LE strength and improved balance. Baseline: 48 with mod assist from PT; 12/28/2023= 44 sec from EOM at lowest postion (approx 21 in) with Left UE support and CGA to min A from PT 5/13: 28.80 sec with HW from [facility]; min assist from PT for full erect standing Goal status: Progressing  3.  Patient will increase FIST score by > 6 points to demonstrate decreased fall risk during functional activities Baseline: 37 5/13: 46/56 Goal status: MET  4.  Patient will complete 10 meter walk test to with HW in less than 1 minute with min assist as to improve gait speed for better community ambulation and to reduce fall risk. Baseline: 6 ft with HW and mod assist; 12/28/2023=18 feet with HW- mod assist  5/13: 2:11 min with HW, min-mod assist.  Goal status: PROGRESSING  5.  Patient will perform stand pivot transfer to and from [facility] with HW and CGA for safety to allow safe transfer to toilet with daughter without use of rail on the wall. Baseline: mod assist and heavy use of rail. 12/28/2023= Min to Mod assist with repetitive VC for  technique 5/13: min assist overall with HW; mod assist initially both directions  to prevent posterior LOB. Goal status: Progressing  6.  Patient will improve bed mobility to min assist only to reduce care giver burden and improve safety of transfers  Baseline: mod-max assist 5/13: Min-mod assist for sit<>supine. Min assist only for RLE management for rolling Goal status: INITIAL  ASSESSMENT:  CLINICAL IMPRESSION:  Chartered loss adjuster continued plan as laid out in recent treatment sessions. Frequent cues provided to emphasize anterior weight shift to decrease use of UE to complete STS. Pt was able to correct intermittently following cues. Pt had slight difficulty sustaining standing balance at support bar requiring intermittent UE support and up to min a from PT.  Ms Wessling will benefit from continued skilled PT to address strength and balance deficits to improve safety for transfers, reduce care giver burden, and allow return to PLOF and possible return home from Assisted living.    OBJECTIVE IMPAIRMENTS: Abnormal gait, cardiopulmonary status limiting activity, decreased activity tolerance, decreased balance, decreased cognition, decreased coordination, decreased endurance, decreased knowledge of use of DME, decreased mobility, difficulty walking, decreased ROM, decreased strength, hypomobility, increased muscle spasms, impaired flexibility, impaired sensation, impaired tone, impaired UE functional use, improper body mechanics, and postural dysfunction.   ACTIVITY LIMITATIONS: carrying, lifting, bending, standing, squatting, stairs, transfers, bed mobility, continence, bathing, toileting, dressing, reach over head, hygiene/grooming, and locomotion level  PARTICIPATION LIMITATIONS:  cleaning, interpersonal relationship, shopping, and community activity  PERSONAL FACTORS: 3+ comorbidities: HTN, chronic CVA, Afib, Lupus  are also affecting patient's functional outcome.   REHAB POTENTIAL: Fair chronic CVA with  poor progress in prior interventions with this clinic  CLINICAL DECISION MAKING: Unstable/unpredictable  EVALUATION COMPLEXITY: High  PLAN:  PT FREQUENCY: 1-2x/week  PT DURATION: 12 weeks  PLANNED INTERVENTIONS: 97110-Therapeutic exercises, 97530- Therapeutic activity, [id number]- Neuromuscular re-education, 97535- Self Care, 16109- Manual therapy, [phone]- Gait training, [phone]- Orthotic Fit/training, [phone]- Aquatic Therapy, [phone]- Splinting, 97014- Electrical stimulation (unattended), [phone]- Electrical stimulation (manual), Patient/Family education, Balance training, Stair training, Joint mobilization, Vestibular training, Visual/preceptual remediation/compensation, Cognitive remediation, DME instructions, Wheelchair mobility training, Cryotherapy, and Moist heat  PLAN FOR NEXT SESSION:   -  R glute activation and strength during standing  Continue  standing balance.  Gait.  Transfers with decreased UE support and awareness of posterior LOB.    Aminta Kales PT, DPT  Physical Therapist - [facility]  9:56 AM 03/12/24

## 2024-03-13 NOTE — Therapy (Incomplete)
 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   REFERRING DIAG:  Diagnosis  I69.398,R25.2 (ICD-10-CM) - Spasticity as late effect of cerebrovascular accident (CVA)    THERAPY DIAG:  No diagnosis found.  Rationale for Evaluation and Treatment: Rehabilitation  SUBJECTIVE:                                                                                                                                                                                              SUBJECTIVE STATEMENT:  03/13/24 Pt answers yes/no: reports no stumbles/falls, and no pain. Pt reports no to question about medication changes or other updates since last seen. No family present today   From EVAL: Daughter present for Evaluation to provide hx. Pt is familiar to this clinic and has been seen for multiple bouts PT since initial CVA in 2021. Pt d/c'ed from PT serviced in April of 2024 with plans to receive PT services through [facility]. Daughter reports that she was only receiving minimal therapy in facility. Reports at least 1 fall in the last 6 months and  daughter asked staff to transfer pt with lift since fall.  Daughter would want pt to demonstrate improve safety with transfers to allow bil transfers with reduced use of rail to allow pt to return home for assisted living.  Unable to discern whether pt in SNF or assisted living care from hx.    Pt accompanied by: self   PERTINENT HISTORY:  1. Acute Ischemic Left MCA Stroke:Right Hemiplegia: Ordered outpatient OT, PT, and SLP. She has had a HFU appointment with Neurology. Discussed that I can provide new scripts next month if needed. Communicated with her providers regarding positive reinforcement. She has made excellent gains in strength as well as cognition and speech!   -Provided with new disability placard.    2. Essential Hypertension: BP is currently very elevated and discussed with patient that it would be unsafe for her to receive injections today as the injections can temporarily increase her pain, which can increase her blood pressure, and put her at risk for stroke. Discussed starting the medication Valsartan  40mg  daily and logging blood pressures daily at home and she is agreeable. Medications reviewed and she is taking Amlodipine  5mg  and lopressor .  Provided with a list of foods that will help lower her BP.  BP reviewed and improved at 136/82 -continue clonidine -Advised checking BP  daily at home and logging results to bring into follow-up appointment with PCP and myself. -Reviewed BP meds today.  -Advised regarding healthy foods that can help lower blood pressure and provided with a list:  PAIN:  Are you having pain? No  PRECAUTIONS: Fall  RED FLAGS: None   WEIGHT BEARING RESTRICTIONS: No  FALLS: Has patient fallen in last 6 months? Yes. Number of falls 1  LIVING ENVIRONMENT: Lives with: lives with their family and lives in a skilled nursing facility Lives in: Other SNF  Stairs: No Has following equipment at home: Hemi walker and Wheelchair (manual)  PLOF: Requires assistive device for independence, Needs assistance with ADLs, Needs assistance with homemaking, and WC level currently   PATIENT GOALS: move better - walking or transferring with hemi walking   OBJECTIVE:  Note: Objective measures were completed at Evaluation unless otherwise noted.  DIAGNOSTIC FINDINGS:  Head CT:  IMPRESSION: 1. No acute intracranial abnormality. 2. No acute displaced fracture or traumatic listhesis of the cervical spine.  Cervical CT:  MPRESSION: 1. No acute intracranial abnormality. 2. No acute displaced fracture or traumatic listhesis of the cervical spine.  COGNITION: Overall cognitive status: History of cognitive impairments - at baseline   SENSATION: Light touch: Impaired  Proprioception: Impaired  Able to detect depe pressure   COORDINATION: Increased tone on the RLE   EDEMA:  Mild RLE distal edema   MUSCLE TONE: RLE: Mild and Moderate  MUSCLE LENGTH: Hamstrings: grossly limited    POSTURE: rounded shoulders, forward head, and posterior bias   LOWER EXTREMITY ROM:     Active  Right Eval Left Eval  Hip flexion  Limited to 5 deg beyond 90 in sitting  Knee flexion  90deg in sitting  Knee extension  Lacking 10 deg full extension  Ankle dorsiflexion  none  Ankle plantarflexion  none   (Blank rows = not tested)  LOWER EXTREMITY MMT:     MMT Right Eval Left Eval  Hip flexion  2+  Hip extension    Hip abduction  2  Hip adduction  3  Hip internal rotation    Hip external rotation    Knee flexion  2+  Knee extension  3+  Ankle dorsiflexion  0  Ankle plantarflexion  0  Ankle inversion    Ankle eversion    (Blank rows = not tested)  BED MOBILITY:  Sit to supine Mod A Supine to sit Mod A Rolling to Right Min A Rolling to Left Min A  TRANSFERS: Assistive device utilized: Hemi walker  Sit to stand: Mod A Stand to sit: Mod A Chair to chair: Mod A Floor: unable to perform     CURB:  Level of Assistance: Total A Assistive device utilized: rail Curb Comments: unable to perform   STAIRS: Unable to perform at eval   GAIT: Gait pattern: non-functional constant posterior bias , step to pattern, decreased stance time- Right, Right hip hike, lateral hip instability, and lateral lean- Left Distance walked: 36ft Assistive device utilized: Hemi walker Level of assistance: Mod A Comments: moderate cues for sequencing of HW and gait pattern in turns   FUNCTIONAL TESTS:  5 times sit to stand: 48 sec with mod Assist from PT  Timed up and go (TUG): unable to perform  10 meter walk test: 6 ft with min-mod assist and HW.  FIST:  Function In Sitting Test (FIST)  (1/2 femur on surface; hips/knees flexed to 90deg)   - indicate bed or mat table / step stool if used  SCORING KEY: 4 = Independent (completes task independently & successfully) 3 = Verbal Cues/Increased Time (completes task independently & successfully and only needs more time/cues) 2 = Upper Extremity Support (must use UE for support or assistance to complete successfully) 1 = Needs Assistance (unable to complete w/o physical assist; DOCUMENT LEVEL: min, mod, max) 0 = Dependent (requires complete physical assist; unable to complete successfully even w/ physical assist)  Randomly Administer Once Throughout Exam  4 - Anterior Nudge (superior sternum)  4  - Posterior Nudge (between scapular spines)  4 - Lateral Nudge (to dominant side at acromion)     4 - Static sitting (30 seconds)  4 - Sitting, shake 'no' (left and right)  4 - Sitting, eyes closed (30 seconds)   3 - Sitting, lift foot (dominant side, lift foot 1 inch twice)    4 - Pick up object from behind (object at midline, hands breadth posterior)  1 mod assist - Forward reach (use dominant arm, must complete full motion) 1 mod assist - Lateral reach (use dominant arm, clear opposite ischial tuberosity) 1 mod assist  - Pick up object from floor (from between feet)   1 mod assist - Posterior scooting (move backwards 2 inches)  1 min assist  - Anterior scooting (move forward 2 inches)  1 min assist  - Lateral scooting (move to dominant side 2 inches)    11/21/23 TOTAL = 37/56  02/13/24 TOTAL = 46/56   Notes/comments: apraxia limiting success of scooting and reacting tasks.  MCD > 5 points MCID for IP REHAB > 6 points      PATIENT SURVEYS:  To be completed  TREATMENT DATE: 03/07/2024   TE:   Seated:  March 2x12 each LE - TC to improve AROM  LAQ 2x12 each LE  RLE knee flexion with pillowcase slide (with PT assist so pt also achieves quad stretch) 10x   Seated adductor squeeze with p.ball 4x10 - getting activation almost exclusively from L side    Therapeutic Activities: dynamic therapeutic activities  designed to achieve improved functional performance       Sit<>stand 1 x 4 with CGA-min assist from PT for anterior weight shift with use of UE to pull to stand   Sustained standing for endurance/strength with attempt at neutral gaze then lower into chair with use of UE on arm-rest instead of bar    Sit<>stand 1 x 5 with CGA-min assist from PT for anterior weight shift with use of UE to pull to stand    Attempt sustained standing for  endurance/strength but pt slightly more fatigued, requires min assist back into chair due to unsteadiness     NMR   Static stand at support bar with intermittent UE support, first practice with neutral gaze, then head turns R and L for balance challenge 1 round x 2 min, 1x 3 min- PT provides up to min assist and cuing to correct balance       PATIENT EDUCATION: Education details: .  Pt educated throughout session about proper posture and technique with exercises. Improved exercise technique, movement at target joints, use of target muscles after min to mod verbal, visual, tactile cues  Person educated: Patient Education method: Explanation Education comprehension: verbalized understanding  HOME EXERCISE PROGRAM: Access Code: [email or web address] URL: [email or web address] Date: 12/05/2023 Prepared by: Aurora Lees  Exercises - Sit to Stand with Counter Support  - 1 x daily - 3-4 x weekly - 2 sets - 10 reps - Seated Long Arc Quad  - 1 x daily - 3-4 x weekly - 2 sets - 10 reps - 2 hold - Seated March  - 1 x daily - 3-4 x weekly - 2 sets - 10 reps - 2 hold - Standing Hip Abduction with Counter Support  - 1 x daily - 7 x weekly - 3 sets - 5 reps - Wide Stance with Counter Support  - 1 x daily - 7 x weekly - 3 sets - 2 reps - 30 hold - Seated Hip Abduction  - 1 x daily - 7 x weekly - 3 sets - 10 reps - Supine Bridge  - 1 x daily - 7 x weekly - 3 sets - 10 reps - Supine Short Arc Quad  - 1 x daily - 7 x weekly - 3 sets - 10 reps - Bent Knee Fallouts  - 1 x daily - 7 x weekly - 3 sets - 10 reps - Small Range Straight Leg Raise  - 1 x daily - 7 x weekly - 3 sets - 10 reps - Supine Heel Slide  - 1 x daily - 7 x weekly - 3 sets - 10 reps   GOALS: Goals reviewed with patient? Yes  SHORT TERM GOALS: Target date: 12/13/2023    Patient will be independent in home exercise program to improve strength/mobility for better functional independence with ADLs. Baseline: to be provided on  visit 2: 12/28/2023- No caregiver with patient to determine if compliant with HEP Goal  5/13: daugher states that they are completing some exercises almost daily  status: IN PROGRESS   LONG TERM GOALS: Target date: 04/02/2024  2.  Patient (> 80 years old) will complete five times sit to stand test by 15 seconds and only min assist indicating an increased LE strength and improved balance. Baseline: 48 with mod assist from PT; 12/28/2023= 44 sec from EOM at lowest postion (approx 21 in) with Left UE support and CGA to min A from PT 5/13: 28.80 sec with HW from [facility]; min assist from PT for full erect standing Goal status: Progressing  3.  Patient will increase FIST score by > 6 points to demonstrate decreased fall risk during functional activities Baseline: 37 5/13: 46/56 Goal status: MET  4.  Patient will complete 10 meter walk test to with HW in less than 1 minute with min assist as to improve gait speed for better community ambulation and to reduce fall risk. Baseline: 6 ft with HW and mod assist; 12/28/2023=18 feet with HW- mod assist  5/13: 2:11 min with HW, min-mod assist.  Goal status: PROGRESSING  5.  Patient will perform stand pivot transfer to and from [facility] with HW and CGA for safety to allow safe transfer to toilet with daughter without use of rail on the wall. Baseline: mod assist and heavy use of rail. 12/28/2023= Min to Mod assist with repetitive VC for technique 5/13: min assist overall with HW; mod assist initially both directions  to prevent posterior LOB. Goal status: Progressing  6.  Patient will improve bed mobility to min assist only to reduce care giver burden and improve safety of transfers  Baseline: mod-max assist 5/13: Min-mod assist for sit<>supine. Min assist only for RLE management for rolling Goal status: INITIAL  ASSESSMENT:  CLINICAL IMPRESSION:  Chartered loss adjuster continued plan as laid out in recent treatment sessions. Frequent cues provided to emphasize anterior  weight shift to decrease use of UE to complete STS. Pt was able to correct intermittently following cues. Pt had slight difficulty sustaining standing balance at support bar requiring intermittent UE support and up to min a from PT.  Ms Villalona will benefit from continued skilled PT to address strength and balance deficits to improve safety for transfers, reduce care giver burden, and allow return to PLOF and possible return home from Assisted living.    OBJECTIVE IMPAIRMENTS: Abnormal gait, cardiopulmonary status limiting activity, decreased activity tolerance, decreased balance, decreased cognition, decreased coordination, decreased endurance, decreased knowledge of use of DME, decreased mobility, difficulty walking, decreased ROM, decreased strength, hypomobility, increased muscle spasms, impaired flexibility, impaired sensation, impaired tone, impaired UE functional use, improper body mechanics, and postural dysfunction.   ACTIVITY LIMITATIONS: carrying, lifting, bending, standing, squatting, stairs, transfers, bed mobility, continence, bathing, toileting, dressing, reach over head, hygiene/grooming, and locomotion level  PARTICIPATION LIMITATIONS: cleaning, interpersonal relationship, shopping, and community activity  PERSONAL FACTORS: 3+ comorbidities: HTN, chronic CVA, Afib, Lupus  are also affecting patient's functional outcome.   REHAB POTENTIAL: Fair chronic CVA with poor progress in prior interventions with this clinic  CLINICAL DECISION MAKING: Unstable/unpredictable  EVALUATION COMPLEXITY: High  PLAN:  PT FREQUENCY: 1-2x/week  PT DURATION: 12 weeks  PLANNED INTERVENTIONS: 97110-Therapeutic exercises, 97530- Therapeutic activity, [id number]- Neuromuscular re-education, 97535- Self Care, 82956- Manual therapy, [phone]- Gait training, [phone]- Orthotic Fit/training, [id number]- Aquatic Therapy, [phone]- Splinting, [id number]- Electrical stimulation (unattended), [phone]- Electrical stimulation (manual),  Patient/Family education, Balance training, Stair training, Joint mobilization, Vestibular training, Visual/preceptual remediation/compensation, Cognitive remediation, DME instructions, Wheelchair mobility training, Cryotherapy, and Moist heat  PLAN FOR NEXT SESSION:   -  R glute activation and strength during standing  Continue  standing balance.  Gait.  Transfers with decreased UE support and awareness of posterior LOB.    Aminta Kales PT, DPT  Physical Therapist - [facility]  2:06 PM 03/13/24

## 2024-03-14 ENCOUNTER — Ambulatory Visit: Payer: Medicare Other | Admitting: Speech Pathology

## 2024-03-14 ENCOUNTER — Ambulatory Visit: Payer: Medicare Other

## 2024-03-14 ENCOUNTER — Ambulatory Visit: Payer: Medicare Other | Admitting: Occupational Therapy

## 2024-03-19 ENCOUNTER — Ambulatory Visit: Payer: Medicare Other

## 2024-03-19 ENCOUNTER — Ambulatory Visit: Payer: Medicare Other | Admitting: Speech Pathology

## 2024-03-19 ENCOUNTER — Ambulatory Visit: Payer: Medicare Other | Admitting: Occupational Therapy

## 2024-03-19 DIAGNOSIS — R278 Other lack of coordination: Secondary | ICD-10-CM

## 2024-03-19 DIAGNOSIS — R2681 Unsteadiness on feet: Secondary | ICD-10-CM

## 2024-03-19 DIAGNOSIS — M6281 Muscle weakness (generalized): Secondary | ICD-10-CM

## 2024-03-19 DIAGNOSIS — R2689 Other abnormalities of gait and mobility: Secondary | ICD-10-CM

## 2024-03-19 DIAGNOSIS — R262 Difficulty in walking, not elsewhere classified: Secondary | ICD-10-CM

## 2024-03-19 DIAGNOSIS — R269 Unspecified abnormalities of gait and mobility: Secondary | ICD-10-CM

## 2024-03-19 DIAGNOSIS — R482 Apraxia: Secondary | ICD-10-CM

## 2024-03-19 NOTE — Therapy (Signed)
 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) 09/24/2021   Cerebral embolism with cerebral infarction 09/23/2021   Pyelonephritis 09/19/2021   Pain due to onychomycosis of toenails of both feet 11/19/2020   Normocytic anemia 05/31/2020   Acute ischemic left MCA stroke (HCC) 04/30/2020   Right hemiplegia (HCC) 04/30/2020   Cerebrovascular accident (HCC) 04/21/2020   Atrial fibrillation with RVR (HCC) 04/21/2020   Essential hypertension 04/21/2020   Alcohol abuse 04/21/2020   ONSET DATE: 09/19/21  REFERRING DIAG: CVA  THERAPY DIAG:  Muscle weakness (generalized)  Rationale for Evaluation and  Treatment: Rehabilitation  SUBJECTIVE:  SUBJECTIVE STATEMENT:  Pt.'s daughter, and granddaughter were present for the treatment session this morning  Pt Pt accompanied by: self, daughter  PERTINENT HISTORY: Pt. is a 68 y.o. 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 Pt. has Right sided weakness, and receptive, and expressive aphasia. Pt. has had a previous CVA with right sided weakness in July 2021. PMHx includes: AFib, Renal Artery thrombosis, situational depression, HTN, Pyelnephritis, Seizure activity, COnstipation, AKI. Vitamin D  deficiency, and urinary incontinence   PRECAUTIONS: None  WEIGHT BEARING RESTRICTIONS: No  PAIN:  Are you having pain? 0-2/10 Wong-Baker Facial Grimace Scale  FALLS: Has patient fallen in last 6 months? Yes. Number of falls 1-ER visit  LIVING ENVIRONMENT: Pt. resides at [facility]. Pt. has very supportive family Stairs: One level Has following equipment at home:  Equipment provided through [employer]  PLOF: Needs assistance with ADLs  PATIENT GOALS:  To improve ROM/flexibility in the RUE 2/2 increased tightness, and to improve ADL functioning in order to towards returning home.  OBJECTIVE:   Note: Objective measures were completed at Evaluation unless otherwise noted.  HAND DOMINANCE: Right  ADLs: Overall ADLs: Assist is provided from the staff at [facility] Transfers/ambulation related to ADLs: Eating: independent, with set-up using the left hand Grooming: Independent with set-up using the left hand UB Dressing: Increased assist  required, Mod-MaxA doffing jacket LB Dressing: Increased assist required Toileting:  Staff Assists with toileting transfers, and toilet hygiene care. Bathing: Staff assists with bathing Tub Shower transfers: Requires staff assist Equipment: TBD  IADLs: Shopping: N/A Light housekeeping: Provided by staff Meal Prep: 3 Meals a  day provided. Pt. Eats in the main dining room when family visits, otherwise Pt. Eats in her room.  Community mobility: Relies on family for transportation to therapy. Per daughter- Transportation is provided for services not offered at the facility. Medication management: Provided by the facility Financial management: Daughter manages Handwriting: TBD  MOBILITY STATUS: Hx of falls  FUNCTIONAL OUTCOME MEASURES:  UPPER EXTREMITY ROM:    Passive ROM Left Eval AROM WFL Right Eval  Right 01/02/24 Right 02/08/24  Shoulder flexion  0(46) 25 (90) 0(94)  Shoulder abduction  0(64) 0 (90) 0(78)  Shoulder adduction      Shoulder extension      Shoulder internal rotation      Shoulder external rotation   (25) 0(25)  Elbow flexion  0(98) 0(131) 0(133)  Elbow extension  0(full passive) 0 (full passive) 0(full passive)  Wrist flexion  0(56) 0(90) 0(74)  Wrist extension  -56(30) 0 (40) 6(42)  Wrist ulnar deviation      Wrist radial deviation      Wrist pronation      Wrist supination      (Blank rows = not tested)  UPPER EXTREMITY MMT:     MMT Left Eval 5/5 Right eval Right  01/02/24 Right 02/08/24  Shoulder flexion  0/5 2/5  0/5  Shoulder abduction  0/5 1/5 0/5  Shoulder adduction   2-/5 1/5  Shoulder extension      Shoulder internal rotation   1/5 0/5  Shoulder external rotation      Middle trapezius      Lower trapezius      Elbow flexion  0/5 0/5 0/5  Elbow extension  0/5 2/5 1/5  Wrist flexion   1/5   Wrist extension  0/5 3-/5 1/5  Wrist ulnar deviation      Wrist radial deviation      Wrist pronation  0/5 0/5 1/5  Wrist supination  0/5 0/5 0/5  Digit flexors   2-/5 2-/5  Digit extensors   0/5 0/5  (Blank rows = not tested)  HAND FUNCTION: N/A  COORDINATION: N/A  SENSATION: Diminished  EDEMA: No edema noted in the right hand today  MUSCLE TONE: impaired  COGNITION: Overall cognitive status: Aphasia  VISION: Wears glasses, no change from  baseline  PERCEPTION: TBD  PRAXIS: Impaired: Motor planning                                                                                                                           TREATMENT DATE: 03/19/24    Manual therapy:   -Soft tissue massage to the scapular, pectoral, and right shoulder musculature. -Soft tissue mobilizations to promote radius on ulna motion  needed for supination -Soft tissue mobilizations for radius on ulna to promote supination -Pt. tolerated carpal rolls, and metacarpal spread stretches.   -Manual therapy was performed independent of, and in preparation for therapeutic Ex.  Therapeutic Exercise:   -PROM through the following joint ranges of the RUE following moist heat modality, including: shoulder flexion, abduction, ER, horizontal abduction, elbow flexion, extension, forearm supination, wrist extension, digit extension, and thumb abduction, working to increase RUE mobility to manage hemiparetic limb during basic ADLs, as well as reducing contracture risk. Janine Melbourne for R shoulder flex/ext, horizontal  abduction/adduction. -Cues, and hand over hand assist were required for self-ROM HEP for the UE.  Therapeutic Activities:  -facilitated RUE diagonal patterns of movement with hand over hand assist in preparation for self-grooming tasks.  PATIENT EDUCATION: Education details: RUE PROM/AAROM, Pt./caregiver education about possible restorative ROM programs at SNF Person educated: Patient Education method: Explanation, Demonstration, Tactile cues, and Verbal cues Education comprehension: verbal cues required, tactile cues required, and needs further education  HOME EXERCISE PROGRAM: RUE self PROM  GOALS: Goals reviewed with patient? Yes  SHORT TERM GOALS: Target date: 01/16/2024    Pt. Will demonstrate supervision with HEPs for BUEs Baseline: Eval: No current HEPs; 01/02/24: Pt demos self PROM techniques throughout the RUE with intermittent min A and mod vc  for accuracy 02/08/24: Pt. requires cues for initiation, and hand over hand assist. 02/20/24:  Goal status: in progress  LONG TERM GOALS: Target date: 05/02/2024    1.  Pt. Will improve right shoulder flexion to 45* to assist with UE dressing Baseline: Eval: Right: 0(46); 01/02/24: Right: 25 (90) 02/08/24: 0(94) 02/20/24: Limited. Assist required for UE dressing Goal status:  Ongoing  2.  Pt. Will improve with shoulder abduction by 20 degrees to assist with underarm care, and hygiene Baseline: Eval: Right 0(64); 01/02/24: Right 0 (90) 02/08/24: 0(78) 02/20/24: Limited. Assist required for underarm hygiene  Goal status: Ongoing  3.  Pt. Will improve right elbow flexion by 10 degrees to assist with hand to face patterns for grooming Baseline: Eval: Right: 0(98); 01/02/24: Right: 0(131) 02/08/24: 1(610) 02/20/24: limited 2/2 tightness Goal status: Ongoing  4.  Pt. Will elicit active wrist extension, and digit extension in preparation for rubbing lotion into the left forearm, and hand  Baseline: Eval: Right wrist: -56 (30); 01/02/24: R wrist: ext: actively to neutral/0 (passively to 40*); R digit extension: no active ext (passively [facility] for this task); unable to self apply lotion to L forearm 02/08/24: right wrist extension: 6(42) 02/20/24: Continue Goal status: I Ongoing  5.  Pt. Will perform UE dressing skills with MinA Baseline: Eval: Mod-MaxA doffing a jacket; 01/02/24: Mod A to don/doff a jacket 02/08/24: ModA to thread the right sleeve, minA to bring the shirt around the back of the neck, and donn the left sleeve. ModA to align seams. 02/20/24: ModA to thread the right sleeve, minA to bring the shirt around the back of the neck, and donn the left sleeve. ModA to align seams.  Goal status: Ongoing  6. Pt. Will require minA toileting transfers/ clothing management Baseline: Eval: Pt. requires increased assist from staff; 01/02/24: pt can transfer to standard height toilet with grab bar for support with min A, but  requires mod A to transfer up from toilet; Pt consistently performs wc<>BSC transfer with min A, extra time, and vc for sequencing while using hemi walker; total A for clothing management. 02/08/24: MinA, Total assist clothing management 02/20/24: MinA, Total assist clothing management  Goal status: in progress  ASSESSMENT:  CLINICAL IMPRESSION:  Pt. continues to require hand-over-hand assist for all ROM 2/2 limited activation of active muscle responses in the RUE, and hand. Pt. continues to present with increased tone, and tightness in right scapular musculature, right shoulder flexion, abduction, elbow flexion, wrist, and digit extension with MP flexion tightness limiting full MP extension. Pt/caregiver education was provided about increased tone/tightness, STM, moist heat, and ROM for the scapular, and shoulder musculature. Pt. continues to benefit from OT services to normalize tone in the dominant RUE, improve ROM for underarm hygiene care and UE dressing, as well as to improve toileting care, and clothing negotiation tasks.    PERFORMANCE DEFICITS: in functional skills including ADLs, IADLs, coordination, dexterity, proprioception, sensation, ROM, strength, pain, Fine motor control, Gross motor control, balance, body mechanics, decreased knowledge of use of DME, and UE functional use, cognitive skills including problem solving, and psychosocial skills including coping strategies, environmental adaptation, and routines and behaviors.   IMPAIRMENTS: are limiting patient from ADLs, IADLs, and leisure.   CO-MORBIDITIES: may have co-morbidities  that affects occupational performance. Patient will benefit from skilled OT to address above impairments and improve overall function.  MODIFICATION OR ASSISTANCE TO COMPLETE EVALUATION: Min-Moderate modification of tasks or assist with assess necessary to complete an evaluation.  OT OCCUPATIONAL PROFILE AND HISTORY: Detailed assessment: Review of records and  additional review of physical, cognitive, psychosocial history related to current functional performance.  CLINICAL DECISION MAKING: Moderate - several treatment options, min-mod task modification necessary  REHAB POTENTIAL: Good  EVALUATION COMPLEXITY: Moderate    PLAN:  OT FREQUENCY: 1x/week  OT DURATION: 12 weeks  PLANNED INTERVENTIONS: 97168 OT Re-evaluation, 97535 self care/ADL training, 96295 therapeutic exercise, 97530 therapeutic activity, 97112 neuromuscular re-education, 97140 manual therapy, 97018 paraffin, 28413 moist heat, 97010 cryotherapy, 97034 contrast bath, passive range of motion, coping strategies training, patient/family education, and DME and/or AE instructions  RECOMMENDED OTHER SERVICES: PT, ST  CONSULTED AND AGREED WITH PLAN OF CARE: Patient  PLAN FOR NEXT SESSION: see above  Duey Ghent, MS, OTR/L   03/19/2024, 12:02 PM

## 2024-03-19 NOTE — Therapy (Signed)
 OUTPATIENT PHYSICAL THERAPY NEURO TREATMENT    Patient Name: Lindsey [name] MRN: [id number] DOB:05/31/1956, 68 y.o., female Today's Date: 03/19/2024   PCP: Lorina Roosevelt, MD  REFERRING PROVIDER: Liam Redhead, MD   END OF SESSION:   PT End of Session - 03/19/24 0858     Visit Number 27    Number of Visits 34    Date for PT Re-Evaluation 04/02/24    Progress Note Due on Visit 30    PT Start Time 0858    PT Stop Time 0930    PT Time Calculation (min) 32 min    Equipment Utilized During Treatment Gait belt   custom R LE AFO   Activity Tolerance Patient tolerated treatment well    Behavior During Therapy WFL for tasks assessed/performed               Past Medical History:  Diagnosis Date   Aphasia    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 No pertinent surgical history. 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   REFERRING DIAG:  Diagnosis  I69.398,R25.2 (ICD-10-CM) - Spasticity as late effect of cerebrovascular accident (CVA)    THERAPY DIAG:  Muscle weakness (generalized)  Unsteadiness on feet  Other lack of coordination  Difficulty in walking, not elsewhere classified  Abnormality of gait and mobility  Other abnormalities of gait and mobility  Apraxia  Rationale for Evaluation and Treatment: Rehabilitation  SUBJECTIVE:  SUBJECTIVE STATEMENT:  03/19/24 Pt answers yes/no: reports no stumbles/falls, and no pain. Daughter present reporting no new changes. Endorses continuing to work on transfers for safety at her facility.    From EVAL: Daughter present for Evaluation to provide hx. Pt is familiar to this clinic and has been seen for multiple bouts PT since initial CVA in 2021. Pt d/c'ed from PT serviced in April of 2024 with plans to receive PT services through [facility]. Daughter reports that she was only receiving minimal therapy in facility. Reports at least 1 fall in the last 6 months and daughter asked staff to transfer pt with lift since fall.  Daughter would want pt to demonstrate improve safety with transfers to allow bil transfers with reduced use of rail to allow pt to return home for assisted living.  Unable to discern whether pt in SNF or assisted living care from hx.    Pt accompanied by: self   PERTINENT HISTORY:  1. Acute Ischemic Left MCA Stroke:Right Hemiplegia: Ordered outpatient OT, PT, and SLP. She has had a HFU appointment with Neurology. Discussed that I can provide new scripts next month if needed. Communicated with her providers regarding positive reinforcement. She has made excellent gains in strength as well as cognition and  speech!   -Provided with new disability placard.    2. Essential Hypertension: BP is currently very elevated and discussed with patient that it would be unsafe for her to receive injections today as the injections can temporarily increase her pain, which can increase her blood pressure, and put her at risk for stroke. Discussed starting the medication Valsartan  40mg  daily and logging blood pressures daily at home and she is agreeable. Medications reviewed and she is taking Amlodipine  5mg  and lopressor .  Provided with a list of foods that will help lower her BP.  BP reviewed and improved at 136/82 -continue clonidine -Advised checking BP daily at home and logging results to bring into follow-up appointment with PCP and myself. -Reviewed BP meds today.  -Advised regarding healthy foods that can help lower blood pressure and provided with a list:  PAIN:  Are you having pain? No  PRECAUTIONS: Fall  RED FLAGS: None   WEIGHT BEARING RESTRICTIONS: No  FALLS: Has patient fallen in last 6 months? Yes. Number of falls 1  LIVING ENVIRONMENT: Lives with: lives with their family and lives in a skilled nursing facility Lives in: Other SNF  Stairs: No Has following equipment at home: Hemi walker and Wheelchair (manual)  PLOF: Requires assistive device for independence, Needs assistance with ADLs, Needs assistance with homemaking, and WC level currently   PATIENT GOALS: move better - walking or transferring with hemi walking   OBJECTIVE:  Note: Objective measures were completed at Evaluation unless otherwise noted.  DIAGNOSTIC FINDINGS:  Head CT:  IMPRESSION: 1. No acute intracranial abnormality. 2. No acute displaced fracture or traumatic listhesis of the cervical spine.  Cervical CT:  MPRESSION: 1. No acute intracranial abnormality. 2. No acute displaced fracture or traumatic listhesis of the cervical spine.  COGNITION: Overall cognitive status: History of cognitive impairments -  at baseline   SENSATION: Light touch: Impaired  Proprioception: Impaired  Able to detect depe pressure   COORDINATION: Increased tone on the RLE   EDEMA:  Mild RLE distal edema   MUSCLE TONE: RLE: Mild and Moderate  MUSCLE LENGTH: Hamstrings: grossly limited    POSTURE: rounded shoulders, forward head, and posterior bias   LOWER EXTREMITY ROM:  Active  Right Eval Left Eval  Hip flexion  Limited to 5 deg beyond 90 in sitting  Knee flexion  90deg in sitting  Knee extension  Lacking 10 deg full extension  Ankle dorsiflexion  none  Ankle plantarflexion  none   (Blank rows = not tested)  LOWER EXTREMITY MMT:    MMT Right Eval Left Eval  Hip flexion  2+  Hip extension    Hip abduction  2  Hip adduction  3  Hip internal rotation    Hip external rotation    Knee flexion  2+  Knee extension  3+  Ankle dorsiflexion  0  Ankle plantarflexion  0  Ankle inversion    Ankle eversion    (Blank rows = not tested)  BED MOBILITY:  Sit to supine Mod A Supine to sit Mod A Rolling to Right Min A Rolling to Left Min A  TRANSFERS: Assistive device utilized: Hemi walker  Sit to stand: Mod A Stand to sit: Mod A Chair to chair: Mod A Floor: unable to perform     CURB:  Level of Assistance: Total A Assistive device utilized: rail Curb Comments: unable to perform   STAIRS: Unable to perform at eval   GAIT: Gait pattern: non-functional constant posterior bias , step to pattern, decreased stance time- Right, Right hip hike, lateral hip instability, and lateral lean- Left Distance walked: 74ft Assistive device utilized: Hemi walker Level of assistance: Mod A Comments: moderate cues for sequencing of HW and gait pattern in turns   FUNCTIONAL TESTS:  5 times sit to stand: 48 sec with mod Assist from PT  Timed up and go (TUG): unable to perform  10 meter walk test: 6 ft with min-mod assist and HW.  FIST:  Function In Sitting Test (FIST)  (1/2 femur on surface;  hips/knees flexed to 90deg)   - indicate bed or mat table / step stool if used  SCORING KEY: 4 = Independent (completes task independently & successfully) 3 = Verbal Cues/Increased Time (completes task independently & successfully and only needs more time/cues) 2 = Upper Extremity Support (must use UE for support or assistance to complete successfully) 1 = Needs Assistance (unable to complete w/o physical assist; DOCUMENT LEVEL: min, mod, max) 0 = Dependent (requires complete physical assist; unable to complete successfully even w/ physical assist)  Randomly Administer Once Throughout Exam  4 - Anterior Nudge (superior sternum)  4 - Posterior Nudge (between scapular spines)  4 - Lateral Nudge (to dominant side at acromion)     4 - Static sitting (30 seconds)  4 - Sitting, shake 'no' (left and right)  4 - Sitting, eyes closed (30 seconds)   3 - Sitting, lift foot (dominant side, lift foot 1 inch twice)    4 - Pick up object from behind (object at midline, hands breadth posterior)  1 mod assist - Forward reach (use dominant arm, must complete full motion) 1 mod assist - Lateral reach (use dominant arm, clear opposite ischial tuberosity) 1 mod assist  - Pick up object from floor (from between feet)   1 mod assist - Posterior scooting (move backwards 2 inches)  1 min assist  - Anterior scooting (move forward 2 inches)  1 min assist  - Lateral scooting (move to dominant side 2 inches)    11/21/23 TOTAL = 37/56  02/13/24 TOTAL = 46/56   Notes/comments: apraxia limiting success of scooting and reacting tasks.  MCD > 5 points MCID for IP REHAB >  6 points      PATIENT SURVEYS:  To be completed                                                                                                                               TREATMENT DATE: 03/07/2024   There.Act:   STS: x5, CGA to minA. VC's for pushing from arm rest and not pulling up on support bar.    SPT to <> from w/c and  standard chair. Has difficulty with posterior LOB needing min to modA to correct on first effort. Multiple STS from standard height chair working on LE positioning and upright standing for glut activation. Standing to R side of pt to work on reducing LUE need for support with standing transfers. Positioning pt's hips towards w/c to SPT back to manual w/c.   There.Ex:  Seated in w/c. AAROM RLE hip flexion for foot clearance with SPT. 2x8.   Seated LAQ on RLE 2x8       PATIENT EDUCATION: Education details: .  Pt educated throughout session about proper posture and technique with exercises. Improved exercise technique, movement at target joints, use of target muscles after min to mod verbal, visual, tactile cues  Person educated: Patient Education method: Explanation Education comprehension: verbalized understanding  HOME EXERCISE PROGRAM: Access Code: [email or web address] URL: [email or web address] Date: 12/05/2023 Prepared by: Aurora Lees  Exercises - Sit to Stand with Counter Support  - 1 x daily - 3-4 x weekly - 2 sets - 10 reps - Seated Long Arc Quad  - 1 x daily - 3-4 x weekly - 2 sets - 10 reps - 2 hold - Seated March  - 1 x daily - 3-4 x weekly - 2 sets - 10 reps - 2 hold - Standing Hip Abduction with Counter Support  - 1 x daily - 7 x weekly - 3 sets - 5 reps - Wide Stance with Counter Support  - 1 x daily - 7 x weekly - 3 sets - 2 reps - 30 hold - Seated Hip Abduction  - 1 x daily - 7 x weekly - 3 sets - 10 reps - Supine Bridge  - 1 x daily - 7 x weekly - 3 sets - 10 reps - Supine Short Arc Quad  - 1 x daily - 7 x weekly - 3 sets - 10 reps - Bent Knee Fallouts  - 1 x daily - 7 x weekly - 3 sets - 10 reps - Small Range Straight Leg Raise  - 1 x daily - 7 x weekly - 3 sets - 10 reps - Supine Heel Slide  - 1 x daily - 7 x weekly - 3 sets - 10 reps   GOALS: Goals reviewed with patient? Yes  SHORT TERM GOALS: Target date: 12/13/2023    Patient will be independent in  home exercise program to improve strength/mobility for better functional independence with ADLs. Baseline: to be provided on visit 2:  12/28/2023- No caregiver with patient to determine if compliant with HEP Goal  5/13: daugher states that they are completing some exercises almost daily  status: IN PROGRESS   LONG TERM GOALS: Target date: 04/02/2024       2.  Patient (> 42 years old) will complete five times sit to stand test by 15 seconds and only min assist indicating an increased LE strength and improved balance. Baseline: 48 with mod assist from PT; 12/28/2023= 44 sec from EOM at lowest postion (approx 21 in) with Left UE support and CGA to min A from PT 5/13: 28.80 sec with HW from [facility]; min assist from PT for full erect standing Goal status: Progressing  3.  Patient will increase FIST score by > 6 points to demonstrate decreased fall risk during functional activities Baseline: 37 5/13: 46/56 Goal status: MET  4.  Patient will complete 10 meter walk test to with HW in less than 1 minute with min assist as to improve gait speed for better community ambulation and to reduce fall risk. Baseline: 6 ft with HW and mod assist; 12/28/2023=18 feet with HW- mod assist  5/13: 2:11 min with HW, min-mod assist.  Goal status: PROGRESSING  5.  Patient will perform stand pivot transfer to and from [facility] with HW and CGA for safety to allow safe transfer to toilet with daughter without use of rail on the wall. Baseline: mod assist and heavy use of rail. 12/28/2023= Min to Mod assist with repetitive VC for technique 5/13: min assist overall with HW; mod assist initially both directions  to prevent posterior LOB. Goal status: Progressing  6.  Patient will improve bed mobility to min assist only to reduce care giver burden and improve safety of transfers  Baseline: mod-max assist 5/13: Min-mod assist for sit<>supine. Min assist only for RLE management for rolling Goal status:  INITIAL  ASSESSMENT:  CLINICAL IMPRESSION:  Continuing PT POC working on standing tolerance and transfers. Pt needs minimal assist to stand but does rely heavily on LUE to pull herself up with support bar. PT working on using LUE from arm rest and anterior weight shifting to improve safety with transfers with staff at facility as support bar not present in normal environmental circumstances. Pt does continue to have intermittent, heavy posterior LOB making some transfers difficult today to <> from w/c leading to modA form PT to prevent a fall. Pt will benefit from continued skilled PT to address strength and balance deficits to improve safety for transfers, reduce care giver burden, and allow return to PLOF and possible return home from Assisted living.   OBJECTIVE IMPAIRMENTS: Abnormal gait, cardiopulmonary status limiting activity, decreased activity tolerance, decreased balance, decreased cognition, decreased coordination, decreased endurance, decreased knowledge of use of DME, decreased mobility, difficulty walking, decreased ROM, decreased strength, hypomobility, increased muscle spasms, impaired flexibility, impaired sensation, impaired tone, impaired UE functional use, improper body mechanics, and postural dysfunction.   ACTIVITY LIMITATIONS: carrying, lifting, bending, standing, squatting, stairs, transfers, bed mobility, continence, bathing, toileting, dressing, reach over head, hygiene/grooming, and locomotion level  PARTICIPATION LIMITATIONS: cleaning, interpersonal relationship, shopping, and community activity  PERSONAL FACTORS: 3+ comorbidities: HTN, chronic CVA, Afib, Lupus  are also affecting patient's functional outcome.   REHAB POTENTIAL: Fair chronic CVA with poor progress in prior interventions with this clinic  CLINICAL DECISION MAKING: Unstable/unpredictable  EVALUATION COMPLEXITY: High  PLAN:  PT FREQUENCY: 1-2x/week  PT DURATION: 12 weeks  PLANNED INTERVENTIONS:  97110-Therapeutic exercises, 97530- Therapeutic activity, [id number]- Neuromuscular re-education,  14782- Self Care, 95621- Manual therapy, [phone]- Gait training, [phone]- Orthotic Fit/training, [phone]- Aquatic Therapy, [phone]- Splinting, 97014- Electrical stimulation (unattended), [phone]- Electrical stimulation (manual), Patient/Family education, Balance training, Stair training, Joint mobilization, Vestibular training, Visual/preceptual remediation/compensation, Cognitive remediation, DME instructions, Wheelchair mobility training, Cryotherapy, and Moist heat  PLAN FOR NEXT SESSION:   -  R glute activation and strength during standing  Continue  standing balance.  Gait.  Transfers with decreased UE support and awareness of posterior LOB.    Marc Senior. Fairly IV, PT, DPT Physical Therapist- [place]  [facility] 9:49 AM 03/19/24

## 2024-03-21 ENCOUNTER — Ambulatory Visit: Payer: Medicare Other

## 2024-03-21 ENCOUNTER — Ambulatory Visit: Payer: Medicare Other | Admitting: Occupational Therapy

## 2024-03-21 ENCOUNTER — Ambulatory Visit: Payer: Medicare Other | Admitting: Physical Therapy

## 2024-03-21 DIAGNOSIS — M6281 Muscle weakness (generalized): Secondary | ICD-10-CM | POA: Diagnosis not present

## 2024-03-21 DIAGNOSIS — R482 Apraxia: Secondary | ICD-10-CM

## 2024-03-21 DIAGNOSIS — R262 Difficulty in walking, not elsewhere classified: Secondary | ICD-10-CM

## 2024-03-21 DIAGNOSIS — R269 Unspecified abnormalities of gait and mobility: Secondary | ICD-10-CM

## 2024-03-21 DIAGNOSIS — R2689 Other abnormalities of gait and mobility: Secondary | ICD-10-CM

## 2024-03-21 DIAGNOSIS — R278 Other lack of coordination: Secondary | ICD-10-CM

## 2024-03-21 DIAGNOSIS — R2681 Unsteadiness on feet: Secondary | ICD-10-CM

## 2024-03-21 NOTE — Therapy (Signed)
 OUTPATIENT PHYSICAL THERAPY NEURO TREATMENT    Patient Name: Lindsey [name] MRN: [id number] DOB:1956/02/10, 68 y.o., female Today's Date: 03/21/2024   PCP: Lorina Roosevelt, MD  REFERRING PROVIDER: Liam Redhead, MD   END OF SESSION:   PT End of Session - 03/21/24 0853     Visit Number 28    Number of Visits 34    Date for PT Re-Evaluation 04/02/24    Progress Note Due on Visit 30    PT Start Time 0854    PT Stop Time 0930    PT Time Calculation (min) 36 min    Equipment Utilized During Treatment Gait belt   custom R LE AFO   Activity Tolerance Patient tolerated treatment well    Behavior During Therapy WFL for tasks assessed/performed               Past Medical History:  Diagnosis Date   Aphasia    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   REFERRING DIAG:  Diagnosis  I69.398,R25.2 (ICD-10-CM) - Spasticity as late effect of cerebrovascular accident (CVA)    THERAPY DIAG:  Muscle weakness (generalized)  Unsteadiness on feet  Other lack of coordination  Difficulty in walking, not elsewhere classified  Abnormality of gait and mobility  Other abnormalities of gait and mobility  Apraxia  Rationale for Evaluation and Treatment: Rehabilitation  SUBJECTIVE:  SUBJECTIVE STATEMENT:  03/21/24 Pt answers yes/no: reports no stumbles/falls, and no pain. No family present today.  Arrives late to PT treatment, unable to explain why.  No pain. No falls. Responds with yes when asked if facility staff is performing stand pivot transfers to toilet.    From EVAL: Daughter present for Evaluation to provide hx. Pt is familiar to this clinic and has been seen for multiple bouts PT since initial CVA in 2021. Pt d/c'ed from PT serviced in April of 2024 with plans to receive PT services through [facility]. Daughter reports that she was only receiving minimal therapy in facility. Reports at least 1 fall in the last 6 months and daughter asked staff to transfer pt with lift since fall.  Daughter would want pt to demonstrate improve safety with transfers to allow bil transfers with reduced use of rail to allow pt to return home for assisted living.  Unable to discern whether pt in SNF or assisted living care from hx.    Pt accompanied by: self   PERTINENT HISTORY:  1. Acute Ischemic Left MCA Stroke:Right Hemiplegia: Ordered outpatient OT, PT, and SLP. She has had a HFU appointment with Neurology. Discussed that I can provide new scripts next month if needed. Communicated with her providers regarding positive  reinforcement. She has made excellent gains in strength as well as cognition and speech!   -Provided with new disability placard.    2. Essential Hypertension: BP is currently very elevated and discussed with patient that it would be unsafe for her to receive injections today as the injections can temporarily increase her pain, which can increase her blood pressure, and put her at risk for stroke. Discussed starting the medication Valsartan  40mg  daily and logging blood pressures daily at home and she is agreeable. Medications reviewed and she is taking Amlodipine  5mg  and lopressor .  Provided with a list of foods that will help lower her BP.  BP reviewed and improved at 136/82 -continue clonidine -Advised checking BP daily at home and logging results to bring into follow-up appointment with PCP and myself. -Reviewed BP meds today.  -Advised regarding healthy foods that can help lower blood pressure and provided with a list:  PAIN:  Are you having pain? No  PRECAUTIONS: Fall  RED FLAGS: None   WEIGHT BEARING RESTRICTIONS: No  FALLS: Has patient fallen in last 6 months? Yes. Number of falls 1  LIVING ENVIRONMENT: Lives with: lives with their family and lives in a skilled nursing facility Lives in: Other SNF  Stairs: No Has following equipment at home: Hemi walker and Wheelchair (manual)  PLOF: Requires assistive device for independence, Needs assistance with ADLs, Needs assistance with homemaking, and WC level currently   PATIENT GOALS: move better - walking or transferring with hemi walking   OBJECTIVE:  Note: Objective measures were completed at Evaluation unless otherwise noted.  DIAGNOSTIC FINDINGS:  Head CT:  IMPRESSION: 1. No acute intracranial abnormality. 2. No acute displaced fracture or traumatic listhesis of the cervical spine.  Cervical CT:  MPRESSION: 1. No acute intracranial abnormality. 2. No acute displaced fracture or traumatic listhesis of the cervical  spine.  COGNITION: Overall cognitive status: History of cognitive impairments - at baseline   SENSATION: Light touch: Impaired  Proprioception: Impaired  Able to detect depe pressure   COORDINATION: Increased tone on the RLE   EDEMA:  Mild RLE distal edema   MUSCLE TONE: RLE: Mild and Moderate  MUSCLE LENGTH: Hamstrings: grossly limited  POSTURE: rounded shoulders, forward head, and posterior bias   LOWER EXTREMITY ROM:     Active  Right Eval Left Eval  Hip flexion  Limited to 5 deg beyond 90 in sitting  Knee flexion  90deg in sitting  Knee extension  Lacking 10 deg full extension  Ankle dorsiflexion  none  Ankle plantarflexion  none   (Blank rows = not tested)  LOWER EXTREMITY MMT:    MMT Right Eval Left Eval  Hip flexion  2+  Hip extension    Hip abduction  2  Hip adduction  3  Hip internal rotation    Hip external rotation    Knee flexion  2+  Knee extension  3+  Ankle dorsiflexion  0  Ankle plantarflexion  0  Ankle inversion    Ankle eversion    (Blank rows = not tested)  BED MOBILITY:  Sit to supine Mod A Supine to sit Mod A Rolling to Right Min A Rolling to Left Min A  TRANSFERS: Assistive device utilized: Hemi walker  Sit to stand: Mod A Stand to sit: Mod A Chair to chair: Mod A Floor: unable to perform     CURB:  Level of Assistance: Total A Assistive device utilized: rail Curb Comments: unable to perform   STAIRS: Unable to perform at eval   GAIT: Gait pattern: non-functional constant posterior bias , step to pattern, decreased stance time- Right, Right hip hike, lateral hip instability, and lateral lean- Left Distance walked: 92ft Assistive device utilized: Hemi walker Level of assistance: Mod A Comments: moderate cues for sequencing of HW and gait pattern in turns   FUNCTIONAL TESTS:  5 times sit to stand: 48 sec with mod Assist from PT  Timed up and go (TUG): unable to perform  10 meter walk test: 6 ft with min-mod  assist and HW.  FIST:  Function In Sitting Test (FIST)  (1/2 femur on surface; hips/knees flexed to 90deg)   - indicate bed or mat table / step stool if used  SCORING KEY: 4 = Independent (completes task independently & successfully) 3 = Verbal Cues/Increased Time (completes task independently & successfully and only needs more time/cues) 2 = Upper Extremity Support (must use UE for support or assistance to complete successfully) 1 = Needs Assistance (unable to complete w/o physical assist; DOCUMENT LEVEL: min, mod, max) 0 = Dependent (requires complete physical assist; unable to complete successfully even w/ physical assist)  Randomly Administer Once Throughout Exam  4 - Anterior Nudge (superior sternum)  4 - Posterior Nudge (between scapular spines)  4 - Lateral Nudge (to dominant side at acromion)     4 - Static sitting (30 seconds)  4 - Sitting, shake 'no' (left and right)  4 - Sitting, eyes closed (30 seconds)   3 - Sitting, lift foot (dominant side, lift foot 1 inch twice)    4 - Pick up object from behind (object at midline, hands breadth posterior)  1 mod assist - Forward reach (use dominant arm, must complete full motion) 1 mod assist - Lateral reach (use dominant arm, clear opposite ischial tuberosity) 1 mod assist  - Pick up object from floor (from between feet)   1 mod assist - Posterior scooting (move backwards 2 inches)  1 min assist  - Anterior scooting (move forward 2 inches)  1 min assist  - Lateral scooting (move to dominant side 2 inches)    11/21/23 TOTAL = 37/56  02/13/24 TOTAL = 46/56   Notes/comments:  apraxia limiting success of scooting and reacting tasks.  MCD > 5 points MCID for IP REHAB > 6 points      PATIENT SURVEYS:  To be completed                                                                                                                               TREATMENT DATE: 03/07/2024  PT applied gait belt for safety and CGA-min assist for  safety throughout session unless otherwise noted.   Sit<>stand at rail with cues to push from arm rest x 10 throughout session.   Standing weight shift R and L x 10 with UE support on the Rail.  Standing pregait step forward/reverse with 1 LE x 10 bil with rest break between bouts.  Side stepping R rail x 3 bil with UE supported on rail and increased assist for L weight shift to allow clearance on the RLE.   Forward reverse gait with HHA on the LUE 74ft x 2. Mod assist intermittently for improved weight shift and to prevent posterior LOB   Sit<>stand with UE on arm rest then HHA in standing x 5  LAQ x 10 RLE. Cues for relaxation between reap Seated quad stretch 3 x 45 sec with overpressure from PT.      PATIENT EDUCATION: Education details: .  Pt educated throughout session about proper posture and technique with exercises. Improved exercise technique, movement at target joints, use of target muscles after min to mod verbal, visual, tactile cues  Person educated: Patient Education method: Explanation Education comprehension: verbalized understanding  HOME EXERCISE PROGRAM: Access Code: [email or web address] URL: [email or web address] Date: 12/05/2023 Prepared by: Aurora Lees  Exercises - Sit to Stand with Counter Support  - 1 x daily - 3-4 x weekly - 2 sets - 10 reps - Seated Long Arc Quad  - 1 x daily - 3-4 x weekly - 2 sets - 10 reps - 2 hold - Seated March  - 1 x daily - 3-4 x weekly - 2 sets - 10 reps - 2 hold - Standing Hip Abduction with Counter Support  - 1 x daily - 7 x weekly - 3 sets - 5 reps - Wide Stance with Counter Support  - 1 x daily - 7 x weekly - 3 sets - 2 reps - 30 hold - Seated Hip Abduction  - 1 x daily - 7 x weekly - 3 sets - 10 reps - Supine Bridge  - 1 x daily - 7 x weekly - 3 sets - 10 reps - Supine Short Arc Quad  - 1 x daily - 7 x weekly - 3 sets - 10 reps - Bent Knee Fallouts  - 1 x daily - 7 x weekly - 3 sets - 10 reps - Small Range Straight Leg  Raise  - 1 x daily - 7 x weekly - 3 sets - 10 reps - Supine Heel Slide  - 1 x daily -  7 x weekly - 3 sets - 10 reps   GOALS: Goals reviewed with patient? Yes  SHORT TERM GOALS: Target date: 12/13/2023    Patient will be independent in home exercise program to improve strength/mobility for better functional independence with ADLs. Baseline: to be provided on visit 2: 12/28/2023- No caregiver with patient to determine if compliant with HEP Goal  5/13: daugher states that they are completing some exercises almost daily  status: IN PROGRESS   LONG TERM GOALS: Target date: 04/02/2024       2.  Patient (> 86 years old) will complete five times sit to stand test by 15 seconds and only min assist indicating an increased LE strength and improved balance. Baseline: 48 with mod assist from PT; 12/28/2023= 44 sec from EOM at lowest postion (approx 21 in) with Left UE support and CGA to min A from PT 5/13: 28.80 sec with HW from [facility]; min assist from PT for full erect standing Goal status: Progressing  3.  Patient will increase FIST score by > 6 points to demonstrate decreased fall risk during functional activities Baseline: 37 5/13: 46/56 Goal status: MET  4.  Patient will complete 10 meter walk test to with HW in less than 1 minute with min assist as to improve gait speed for better community ambulation and to reduce fall risk. Baseline: 6 ft with HW and mod assist; 12/28/2023=18 feet with HW- mod assist  5/13: 2:11 min with HW, min-mod assist.  Goal status: PROGRESSING  5.  Patient will perform stand pivot transfer to and from [facility] with HW and CGA for safety to allow safe transfer to toilet with daughter without use of rail on the wall. Baseline: mod assist and heavy use of rail. 12/28/2023= Min to Mod assist with repetitive VC for technique 5/13: min assist overall with HW; mod assist initially both directions  to prevent posterior LOB. Goal status: Progressing  6.  Patient will improve bed  mobility to min assist only to reduce care giver burden and improve safety of transfers  Baseline: mod-max assist 5/13: Min-mod assist for sit<>supine. Min assist only for RLE management for rolling Goal status: INITIAL  ASSESSMENT:  CLINICAL IMPRESSION:   Continuing PT POC working on standing tolerance and transfers. Continued to address transfer and gait with reduced UE support with gait. Able to perform HHA gait forward/reverse 60ft x 2 with min/mod assist. Improved anterior weight shift and use of arm rest for transfer with increased repetitions. Continued to have limited knee flexion due to cocontraction of HS and quads as well as spasticity in quads.  Pt will benefit from continued skilled PT to address strength and balance deficits to improve safety for transfers, reduce care giver burden, and allow return to PLOF and possible return home from Assisted living.   OBJECTIVE IMPAIRMENTS: Abnormal gait, cardiopulmonary status limiting activity, decreased activity tolerance, decreased balance, decreased cognition, decreased coordination, decreased endurance, decreased knowledge of use of DME, decreased mobility, difficulty walking, decreased ROM, decreased strength, hypomobility, increased muscle spasms, impaired flexibility, impaired sensation, impaired tone, impaired UE functional use, improper body mechanics, and postural dysfunction.   ACTIVITY LIMITATIONS: carrying, lifting, bending, standing, squatting, stairs, transfers, bed mobility, continence, bathing, toileting, dressing, reach over head, hygiene/grooming, and locomotion level  PARTICIPATION LIMITATIONS: cleaning, interpersonal relationship, shopping, and community activity  PERSONAL FACTORS: 3+ comorbidities: HTN, chronic CVA, Afib, Lupus  are also affecting patient's functional outcome.   REHAB POTENTIAL: Fair chronic CVA with poor progress in prior interventions with  this clinic  CLINICAL DECISION MAKING:  Unstable/unpredictable  EVALUATION COMPLEXITY: High  PLAN:  PT FREQUENCY: 1-2x/week  PT DURATION: 12 weeks  PLANNED INTERVENTIONS: 97110-Therapeutic exercises, 97530- Therapeutic activity, [id number]- Neuromuscular re-education, 97535- Self Care, 16109- Manual therapy, [phone]- Gait training, [phone]- Orthotic Fit/training, [phone]- Aquatic Therapy, [phone]- Splinting, 97014- Electrical stimulation (unattended), [phone]- Electrical stimulation (manual), Patient/Family education, Balance training, Stair training, Joint mobilization, Vestibular training, Visual/preceptual remediation/compensation, Cognitive remediation, DME instructions, Wheelchair mobility training, Cryotherapy, and Moist heat  PLAN FOR NEXT SESSION:   -  R glute activation and strength during standing  Continue  standing balance.  Gait.  Transfers with decreased UE support and awareness of posterior LOB.   Aurora Lees PT, DPT  Physical Therapist - [facility]  10:09 AM 03/21/24

## 2024-03-25 NOTE — Therapy (Incomplete)
 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   REFERRING DIAG:  Diagnosis  I69.398,R25.2 (ICD-10-CM) - Spasticity as late effect of cerebrovascular accident (CVA)    THERAPY DIAG:  No diagnosis found.  Rationale for Evaluation and Treatment: Rehabilitation  SUBJECTIVE:                                                                                                                                                                                              SUBJECTIVE STATEMENT:  03/25/24 Pt answers yes/no: reports no stumbles/falls, and no pain. No family present today.  Arrives late to PT treatment, unable to explain why.  No pain. No falls. Responds with yes when asked if facility staff is performing stand pivot transfers to toilet.    From EVAL: Daughter present for Evaluation to provide hx. Pt is familiar to this clinic and has been seen for multiple bouts PT since initial CVA in 2021. Pt d/c'ed from PT serviced in April of 2024 with plans to receive PT services through [facility]. Daughter reports that she was  only receiving minimal therapy in facility. Reports at least 1 fall in the last 6 months and daughter asked staff to transfer pt with lift since fall.  Daughter would want pt to demonstrate improve safety with transfers to allow bil transfers with reduced use of rail to allow pt to return home for assisted living.  Unable to discern whether pt in SNF or assisted living care from hx.    Pt accompanied by: self   PERTINENT HISTORY:  1. Acute Ischemic Left MCA Stroke:Right Hemiplegia: Ordered outpatient OT, PT, and SLP. She has had a HFU appointment with Neurology. Discussed that I can provide new scripts next month if needed. Communicated with her providers regarding positive reinforcement. She has made excellent gains in strength as well as cognition and speech!   -Provided with new disability placard.    2. Essential Hypertension: BP is currently very elevated and discussed with patient that it would be unsafe for her to receive injections today as the injections can temporarily increase her pain, which can increase her blood pressure, and put her at risk for stroke. Discussed starting the medication Valsartan  40mg  daily and logging blood pressures daily at home and she is agreeable. Medications reviewed and she is taking Amlodipine  5mg  and lopressor .  Provided with a list of foods that will help lower her  BP.  BP reviewed and improved at 136/82 -continue clonidine -Advised checking BP daily at home and logging results to bring into follow-up appointment with PCP and myself. -Reviewed BP meds today.  -Advised regarding healthy foods that can help lower blood pressure and provided with a list:  PAIN:  Are you having pain? No  PRECAUTIONS: Fall  RED FLAGS: None   WEIGHT BEARING RESTRICTIONS: No  FALLS: Has patient fallen in last 6 months? Yes. Number of falls 1  LIVING ENVIRONMENT: Lives with: lives with their family and lives in a skilled nursing facility Lives in: Other SNF  Stairs: No Has following equipment at home: Hemi walker and Wheelchair (manual)  PLOF: Requires assistive device for independence, Needs assistance with ADLs, Needs assistance with homemaking, and WC level currently   PATIENT GOALS: move better - walking or transferring with hemi walking   OBJECTIVE:  Note: Objective measures were completed at Evaluation unless otherwise noted.  DIAGNOSTIC FINDINGS:  Head CT:  IMPRESSION: 1. No acute intracranial abnormality. 2. No acute displaced fracture or traumatic listhesis of the cervical spine.  Cervical CT:  MPRESSION: 1. No acute intracranial abnormality. 2. No acute displaced fracture or traumatic listhesis of the cervical spine.  COGNITION: Overall cognitive status: History of cognitive impairments - at baseline   SENSATION: Light touch: Impaired  Proprioception: Impaired  Able to detect depe pressure   COORDINATION: Increased tone on the RLE   EDEMA:  Mild RLE distal edema   MUSCLE TONE: RLE: Mild and Moderate  MUSCLE LENGTH: Hamstrings: grossly limited    POSTURE: rounded shoulders, forward head, and posterior bias   LOWER EXTREMITY ROM:     Active  Right Eval Left Eval  Hip flexion  Limited to 5 deg beyond 90 in sitting  Knee flexion  90deg in sitting  Knee extension  Lacking 10 deg full extension  Ankle dorsiflexion  none   Ankle plantarflexion  none   (Blank rows = not tested)  LOWER EXTREMITY MMT:    MMT Right Eval Left Eval  Hip flexion  2+  Hip extension    Hip abduction  2  Hip adduction  3  Hip internal rotation    Hip external rotation    Knee flexion  2+  Knee extension  3+  Ankle dorsiflexion  0  Ankle plantarflexion  0  Ankle inversion    Ankle eversion    (Blank rows = not tested)  BED MOBILITY:  Sit to supine Mod A Supine to sit Mod A Rolling to Right Min A Rolling to Left Min A  TRANSFERS: Assistive device utilized: Hemi walker  Sit to stand: Mod A Stand to sit: Mod A Chair to chair: Mod A Floor: unable to perform     CURB:  Level of Assistance: Total A Assistive device utilized: rail Curb Comments: unable to perform   STAIRS: Unable to perform at eval   GAIT: Gait pattern: non-functional constant posterior bias , step to pattern, decreased stance time- Right, Right hip hike, lateral hip instability, and lateral lean- Left Distance walked: 69ft Assistive device utilized: Hemi walker Level of assistance: Mod A Comments: moderate cues for sequencing of HW and gait pattern in turns   FUNCTIONAL TESTS:  5 times sit to stand: 48 sec with mod Assist from PT  Timed up and go (TUG): unable to perform  10 meter walk test: 6 ft with min-mod assist and HW.  FIST:  Function In Sitting Test (FIST)  (1/2 femur on surface; hips/knees flexed to 90deg)   - indicate bed or mat table / step stool if used  SCORING KEY: 4 = Independent (completes task independently & successfully) 3 = Verbal Cues/Increased Time (completes task independently & successfully and only needs more time/cues) 2 = Upper Extremity Support (must use UE for support or assistance to complete successfully) 1 = Needs Assistance (unable to complete w/o physical assist; DOCUMENT LEVEL: min, mod, max) 0 = Dependent (requires complete physical assist; unable to complete successfully even w/ physical  assist)  Randomly Administer Once Throughout Exam  4 - Anterior Nudge (superior sternum)  4 - Posterior Nudge (between scapular spines)  4 - Lateral Nudge (to dominant side at acromion)     4 - Static sitting (30 seconds)  4 - Sitting, shake 'no' (left and right)  4 - Sitting, eyes closed (30 seconds)   3 - Sitting, lift foot (dominant side, lift foot 1 inch twice)    4 - Pick up object from behind (object at midline, hands breadth posterior)  1 mod assist - Forward reach (use dominant arm, must complete full motion) 1 mod assist - Lateral reach (use dominant arm, clear opposite ischial tuberosity) 1 mod assist  - Pick up object from floor (from between feet)   1 mod assist - Posterior scooting (move backwards 2 inches)  1 min assist  - Anterior scooting (move forward 2 inches)  1 min assist  - Lateral scooting (move to dominant side 2 inches)    11/21/23 TOTAL = 37/56  02/13/24 TOTAL = 46/56   Notes/comments: apraxia limiting success of scooting and reacting tasks.  MCD > 5 points MCID for IP REHAB > 6 points      PATIENT SURVEYS:  To be completed  TREATMENT DATE: 03/07/2024  PT applied gait belt for safety and CGA-min assist for safety throughout session unless otherwise noted.   Sit<>stand at rail with cues to push from arm rest x 10 throughout session.   Standing weight shift R and L x 10 with UE support on the Rail.  Standing pregait step forward/reverse with 1 LE x 10 bil with rest break between bouts.  Side stepping R rail x 3 bil with UE supported on rail and increased assist for L weight shift to allow clearance on the RLE.   Forward reverse gait with HHA on the LUE 36ft x 2. Mod assist intermittently for improved weight shift and to prevent posterior LOB   Sit<>stand with UE on arm rest then HHA in standing x 5  LAQ x 10 RLE. Cues for  relaxation between reap Seated quad stretch 3 x 45 sec with overpressure from PT.      PATIENT EDUCATION: Education details: .  Pt educated throughout session about proper posture and technique with exercises. Improved exercise technique, movement at target joints, use of target muscles after min to mod verbal, visual, tactile cues  Person educated: Patient Education method: Explanation Education comprehension: verbalized understanding  HOME EXERCISE PROGRAM: Access Code: [email or web address] URL: [email or web address] Date: 12/05/2023 Prepared by: Massie Dollar  Exercises - Sit to Stand with Counter Support  - 1 x daily - 3-4 x weekly - 2 sets - 10 reps - Seated Long Arc Quad  - 1 x daily - 3-4 x weekly - 2 sets - 10 reps - 2 hold - Seated March  - 1 x daily - 3-4 x weekly - 2 sets - 10 reps - 2 hold - Standing Hip Abduction with Counter Support  - 1 x daily - 7 x weekly - 3 sets - 5 reps - Wide Stance with Counter Support  - 1 x daily - 7 x weekly - 3 sets - 2 reps - 30 hold - Seated Hip Abduction  - 1 x daily - 7 x weekly - 3 sets - 10 reps - Supine Bridge  - 1 x daily - 7 x weekly - 3 sets - 10 reps - Supine Short Arc Quad  - 1 x daily - 7 x weekly - 3 sets - 10 reps - Bent Knee Fallouts  - 1 x daily - 7 x weekly - 3 sets - 10 reps - Small Range Straight Leg Raise  - 1 x daily - 7 x weekly - 3 sets - 10 reps - Supine Heel Slide  - 1 x daily - 7 x weekly - 3 sets - 10 reps   GOALS: Goals reviewed with patient? Yes  SHORT TERM GOALS: Target date: 12/13/2023    Patient will be independent in home exercise program to improve strength/mobility for better functional independence with ADLs. Baseline: to be provided on visit 2: 12/28/2023- No caregiver with patient to determine if compliant with HEP Goal  5/13: daugher states that they are completing some exercises almost daily  status: IN PROGRESS   LONG TERM GOALS: Target date: 04/02/2024       2.  Patient (> 87 years  old) will complete five times sit to stand test by 15 seconds and only min assist indicating an increased LE strength and improved balance. Baseline: 48 with mod assist from PT; 12/28/2023= 44 sec from EOM at lowest postion (approx 21 in) with Left UE support and CGA to min A from PT 5/13: 28.80 sec with  HW from [facility]; min assist from PT for full erect standing Goal status: Progressing  3.  Patient will increase FIST score by > 6 points to demonstrate decreased fall risk during functional activities Baseline: 37 5/13: 46/56 Goal status: MET  4.  Patient will complete 10 meter walk test to with HW in less than 1 minute with min assist as to improve gait speed for better community ambulation and to reduce fall risk. Baseline: 6 ft with HW and mod assist; 12/28/2023=18 feet with HW- mod assist  5/13: 2:11 min with HW, min-mod assist.  Goal status: PROGRESSING  5.  Patient will perform stand pivot transfer to and from [facility] with HW and CGA for safety to allow safe transfer to toilet with daughter without use of rail on the wall. Baseline: mod assist and heavy use of rail. 12/28/2023= Min to Mod assist with repetitive VC for technique 5/13: min assist overall with HW; mod assist initially both directions  to prevent posterior LOB. Goal status: Progressing  6.  Patient will improve bed mobility to min assist only to reduce care giver burden and improve safety of transfers  Baseline: mod-max assist 5/13: Min-mod assist for sit<>supine. Min assist only for RLE management for rolling Goal status: INITIAL  ASSESSMENT:  CLINICAL IMPRESSION:   Continuing PT POC working on standing tolerance and transfers. Continued to address transfer and gait with reduced UE support with gait. Able to perform HHA gait forward/reverse 44ft x 2 with min/mod assist. Improved anterior weight shift and use of arm rest for transfer with increased repetitions. Continued to have limited knee flexion due to cocontraction of HS and  quads as well as spasticity in quads.  Pt will benefit from continued skilled PT to address strength and balance deficits to improve safety for transfers, reduce care giver burden, and allow return to PLOF and possible return home from Assisted living.   OBJECTIVE IMPAIRMENTS: Abnormal gait, cardiopulmonary status limiting activity, decreased activity tolerance, decreased balance, decreased cognition, decreased coordination, decreased endurance, decreased knowledge of use of DME, decreased mobility, difficulty walking, decreased ROM, decreased strength, hypomobility, increased muscle spasms, impaired flexibility, impaired sensation, impaired tone, impaired UE functional use, improper body mechanics, and postural dysfunction.   ACTIVITY LIMITATIONS: carrying, lifting, bending, standing, squatting, stairs, transfers, bed mobility, continence, bathing, toileting, dressing, reach over head, hygiene/grooming, and locomotion level  PARTICIPATION LIMITATIONS: cleaning, interpersonal relationship, shopping, and community activity  PERSONAL FACTORS: 3+ comorbidities: HTN, chronic CVA, Afib, Lupus  are also affecting patient's functional outcome.   REHAB POTENTIAL: Fair chronic CVA with poor progress in prior interventions with this clinic  CLINICAL DECISION MAKING: Unstable/unpredictable  EVALUATION COMPLEXITY: High  PLAN:  PT FREQUENCY: 1-2x/week  PT DURATION: 12 weeks  PLANNED INTERVENTIONS: 97110-Therapeutic exercises, 97530- Therapeutic activity, [id number]- Neuromuscular re-education, 97535- Self Care, [id number]- Manual therapy, [phone]- Gait training, [phone]- Orthotic Fit/training, [phone]- Aquatic Therapy, [phone]- Splinting, [id number]- Electrical stimulation (unattended), [phone]- Electrical stimulation (manual), Patient/Family education, Balance training, Stair training, Joint mobilization, Vestibular training, Visual/preceptual remediation/compensation, Cognitive remediation, DME instructions, Wheelchair mobility  training, Cryotherapy, and Moist heat  PLAN FOR NEXT SESSION:   -  R glute activation and strength during standing  Continue  standing balance.  Gait.  Transfers with decreased UE support and awareness of posterior LOB.    Chyrl London, PT Physical Therapist - [facility]  1:46 PM 03/25/24

## 2024-03-26 ENCOUNTER — Ambulatory Visit: Payer: Medicare Other | Admitting: Occupational Therapy

## 2024-03-26 ENCOUNTER — Ambulatory Visit: Payer: Medicare Other

## 2024-03-26 ENCOUNTER — Ambulatory Visit: Payer: Medicare Other | Admitting: Speech Pathology

## 2024-03-26 DIAGNOSIS — M6281 Muscle weakness (generalized): Secondary | ICD-10-CM | POA: Diagnosis not present

## 2024-03-26 NOTE — Therapy (Addendum)
 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) 09/24/2021   Cerebral embolism with cerebral infarction 09/23/2021   Pyelonephritis 09/19/2021   Pain due to onychomycosis of toenails of both feet 11/19/2020   Normocytic anemia 05/31/2020   Acute ischemic left MCA stroke (HCC) 04/30/2020   Right hemiplegia (HCC) 04/30/2020   Cerebrovascular accident (HCC) 04/21/2020   Atrial fibrillation with RVR (HCC) 04/21/2020   Essential hypertension 04/21/2020   Alcohol abuse 04/21/2020   ONSET DATE: 09/19/21  REFERRING DIAG: CVA  THERAPY DIAG:  Muscle weakness (generalized)  Rationale for Evaluation  and Treatment: Rehabilitation  SUBJECTIVE:  SUBJECTIVE STATEMENT: Pt. reports  that she has to get better. Pt Pt accompanied by: self, daughter  PERTINENT HISTORY: Pt. is a 68 y.o. 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 Pt. has Right sided weakness, and receptive, and expressive aphasia. Pt. has had a previous CVA with right sided weakness in July 2021. PMHx includes: AFib, Renal Artery thrombosis, situational depression, HTN, Pyelnephritis, Seizure activity, COnstipation, AKI. Vitamin D  deficiency, and urinary incontinence   PRECAUTIONS: None  WEIGHT BEARING RESTRICTIONS: No  PAIN:  Are you having pain? 2/10 FACES pain in the right shoulder   FALLS: Has patient fallen in last 6 months? Yes. Number of falls 1-ER visit  LIVING ENVIRONMENT: Pt. resides at [facility]. Pt. has very supportive family Stairs: One level Has following equipment at home:  Equipment provided through [employer]  PLOF: Needs assistance with ADLs  PATIENT GOALS:  To improve ROM/flexibility in the RUE 2/2 increased tightness, and to improve ADL functioning in order to towards returning home.  OBJECTIVE:  Note: Objective measures were completed at Evaluation unless otherwise noted.  HAND DOMINANCE: Right  ADLs: Overall ADLs: Assist is provided from the staff at [facility]. Transfers/ambulation related to ADLs: Eating: independent, with set-up using the left hand Grooming: Independent with set-up using the left hand UB Dressing: Increased assist required, Mod-MaxA doffing jacket LB  Dressing: Increased assist required Toileting:  Staff Assists with toileting transfers, and toilet hygiene care. Bathing: Staff assists with bathing Tub Shower transfers: Requires staff assist Equipment: TBD  IADLs: Shopping: N/A Light housekeeping: Provided by staff Meal Prep: 3 Meals a day provided. Pt. Eats in the main dining room  when family visits, otherwise Pt. Eats in her room.  Community mobility: Relies on family for transportation to therapy. Per daughter- Transportation is provided for services not offered at the facility. Medication management: Provided by the facility Financial management: Daughter manages Handwriting: TBD  MOBILITY STATUS: Hx of falls  FUNCTIONAL OUTCOME MEASURES:  UPPER EXTREMITY ROM:    Passive ROM Left Eval AROM WFL Right Eval  Right 01/02/24  Shoulder flexion  0(46) 25 (90)  Shoulder abduction  0(64) 0 (90)  Shoulder adduction     Shoulder extension     Shoulder internal rotation     Shoulder external rotation   (25)  Elbow flexion  0(98) 0(131)  Elbow extension  0(full passive) 0 (full passive)  Wrist flexion  0(56) 0(90)  Wrist extension  -56(30) 0 (40)  Wrist ulnar deviation     Wrist radial deviation     Wrist pronation     Wrist supination     (Blank rows = not tested)  UPPER EXTREMITY MMT:     MMT Left Eval 5/5 Right eval Right  01/02/24  Shoulder flexion  0/5 2/5   Shoulder abduction  0/5 1/5  Shoulder adduction   2-/5  Shoulder extension     Shoulder internal rotation   1/5  Shoulder external rotation     Middle trapezius     Lower trapezius     Elbow flexion  0/5 0/5  Elbow extension  0/5 2/5  Wrist flexion   1/5  Wrist extension  0/5 3-/5  Wrist ulnar deviation     Wrist radial deviation     Wrist pronation  0/5 0/5  Wrist supination  0/5 0/5  Digit flexors   2-/5  Digit extensors   0/5  (Blank rows = not tested)  HAND FUNCTION: N/A  COORDINATION: N/A  SENSATION: Diminished  EDEMA: No edema noted in the right hand today  MUSCLE TONE: impaired  COGNITION: Overall cognitive status: Aphasia  VISION: Wears glasses, no change from baseline  PERCEPTION: TBD  PRAXIS: Impaired: Motor planning                                                                                                                           TREATMENT DATE:  02/06/24    Manual therapy:   -Soft tissue massage to the scapular, pectoral, and right shoulder musculature. -Soft tissue mobilizations to promote radius on ulna motion needed for supination.  -Manual therapy was performed independent of, and in preparation for therapeutic Ex.  Therapeutic Exercise:   -PROM through the following joint ranges of the RUE, including: shoulder flexion, abduction, ER, horizontal abduction, elbow flexion, extension, forearm supination,  wrist extension, digit extension, and thumb abduction, working to increase RUE mobility to manage hemiparetic limb during basic ADLs, as well as reducing contracture risk. POLLY for R shoulder flex/ext, horizontal  abduction/adduction. -Cues, and hand over hand assist were required for self-ROM HEP for the UE.  Self-care:  -Worked on donning, and doffing a zip-up jacket using one armed dressing techniques.    PATIENT EDUCATION: Education details: RUE PROM/AAROM, Pt./caregiver education about possible restorative ROM programs at SNF Person educated: Patient Education method: Explanation, Demonstration, Tactile cues, and Verbal cues Education comprehension: verbal cues required, tactile cues required, and needs further education  HOME EXERCISE PROGRAM: RUE self PROM  GOALS: Goals reviewed with patient? Yes  SHORT TERM GOALS: Target date: 01/16/2024    Pt. Will demonstrate supervision with HEPs for BUEs Baseline: Eval: No current HEPs; 01/02/24: Pt demos self PROM techniques throughout the RUE with intermittent min A and mod vc for accuracy Goal status: in progress  LONG TERM GOALS: Target date: 02/07/2024    1.  Pt. Will improve right shoulder flexion to 45* to assist with UE dressing Baseline: Eval: Right: 0(46); 01/02/24: Right: 25 (90) Goal status: revised on 01/02/24 d/t 20* increase met/ in progress  2.  Pt. Will improve with shoulder abduction by 20 degrees to assist with underarm care, and hygiene Baseline: Eval:  Right 0(64); 01/02/24: Right 0 (90) Goal status: achieved  3.  Pt. Will improve right elbow flexion by 10 degrees to assist with hand to face patterns for grooming Baseline: Eval: Right: 0(98); 01/02/24: Right: 0(131) Goal status: in progress  4.  Pt. Will elicit active wrist extension, and digit extension in preparation for rubbing lotion into the left forearm, and hand  Baseline: Eval: Right wrist: -56 (30); 01/02/24: R wrist: ext: actively to neutral/0 (passively to 40*); R digit extension: no active ext (passively [facility] for this task); unable to self apply lotion to L forearm Goal status: in progress  5.  Pt. Will perform UE dressing skills with MinA Baseline: Eval: Mod-MaxA doffing a jacket; 01/02/24: Mod A to don/doff a jacket Goal status: in progress  6. Pt. Will require minA toileting transfers/ clothing management Baseline: Eval: Pt. Requires increased assist from staff; 01/02/24: pt can transfer to standard height toilet with grab bar for support with min A, but requires mod A to transfer up from toilet; Pt consistently performs wc<>BSC transfer with min A, extra time, and vc for sequencing while using hemi walker; total A for clothing management.  Goal status: in progress  ASSESSMENT:  CLINICAL IMPRESSION:  Pt. tolerate moist heat modality, manual therapy, and PROM through all joint ranges. Pt. continues to require ROM to the RUE 2/2 increased tone, tightness, and requires assist to perform it thoroughly for herself. Pt. continues to present with increased flexor tone, with significant tightness proximally through the right scapular and pectoral musculature, as well as shoulder musculature, elbow extensor tightness, as well as wrist and digit flexion. Elbow extensor tightness hinders diagonal patterns of of movement, and hand-face/mouth  patterns. Pt. continues to benefit from OT services to normalize tone in the dominant RUE, improve ROM for underarm hygiene care and UE dressing, as well as to  improve toileting care, and clothing negotiation tasks.    PERFORMANCE DEFICITS: in functional skills including ADLs, IADLs, coordination, dexterity, proprioception, sensation, ROM, strength, pain, Fine motor control, Gross motor control, balance, body mechanics, decreased knowledge of use of DME, and UE functional use, cognitive skills including problem solving, and psychosocial skills  including coping strategies, environmental adaptation, and routines and behaviors.   IMPAIRMENTS: are limiting patient from ADLs, IADLs, and leisure.   CO-MORBIDITIES: may have co-morbidities  that affects occupational performance. Patient will benefit from skilled OT to address above impairments and improve overall function.  MODIFICATION OR ASSISTANCE TO COMPLETE EVALUATION: Min-Moderate modification of tasks or assist with assess necessary to complete an evaluation.  OT OCCUPATIONAL PROFILE AND HISTORY: Detailed assessment: Review of records and additional review of physical, cognitive, psychosocial history related to current functional performance.  CLINICAL DECISION MAKING: Moderate - several treatment options, min-mod task modification necessary  REHAB POTENTIAL: Good  EVALUATION COMPLEXITY: Moderate    PLAN:  OT FREQUENCY: 2x/week  OT DURATION: 12 weeks  PLANNED INTERVENTIONS: 97168 OT Re-evaluation, 97535 self care/ADL training, 02889 therapeutic exercise, 97530 therapeutic activity, 97112 neuromuscular re-education, 97140 manual therapy, 97018 paraffin, 02989 moist heat, 97010 cryotherapy, 97034 contrast bath, passive range of motion, coping strategies training, patient/family education, and DME and/or AE instructions  RECOMMENDED OTHER SERVICES: PT, ST  CONSULTED AND AGREED WITH PLAN OF CARE: Patient  PLAN FOR NEXT SESSION: see above  Richardson Otter, MS, OTR/L   03/26/2024, 10:12 AM

## 2024-03-28 ENCOUNTER — Ambulatory Visit: Payer: Medicare Other

## 2024-03-28 ENCOUNTER — Ambulatory Visit: Payer: Medicare Other | Admitting: Speech Pathology

## 2024-03-28 ENCOUNTER — Ambulatory Visit: Payer: Medicare Other | Admitting: Occupational Therapy

## 2024-03-28 DIAGNOSIS — R262 Difficulty in walking, not elsewhere classified: Secondary | ICD-10-CM

## 2024-03-28 DIAGNOSIS — R2681 Unsteadiness on feet: Secondary | ICD-10-CM

## 2024-03-28 DIAGNOSIS — M6281 Muscle weakness (generalized): Secondary | ICD-10-CM | POA: Diagnosis not present

## 2024-03-28 DIAGNOSIS — R2689 Other abnormalities of gait and mobility: Secondary | ICD-10-CM

## 2024-03-28 DIAGNOSIS — R269 Unspecified abnormalities of gait and mobility: Secondary | ICD-10-CM

## 2024-03-28 DIAGNOSIS — R278 Other lack of coordination: Secondary | ICD-10-CM

## 2024-03-28 NOTE — Therapy (Signed)
 OUTPATIENT PHYSICAL THERAPY NEURO TREATMENT    Patient Name: Lindsey [name] MRN: [id number] DOB:12-21-1955, 68 y.o., female Today's Date: 03/28/2024   PCP: Lorel Maxie LABOR, MD  REFERRING PROVIDER: Lorilee Sven SQUIBB, MD   END OF SESSION:   PT End of Session - 03/28/24 0908     Visit Number 29    Number of Visits 34    Date for PT Re-Evaluation 04/02/24    Progress Note Due on Visit 30    PT Start Time 0857    Equipment Utilized During Treatment Gait belt   custom R LE AFO   Activity Tolerance Patient tolerated treatment well    Behavior During Therapy WFL for tasks assessed/performed                Past Medical History:  Diagnosis Date   Aphasia    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 No pertinent surgical history. 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    REFERRING DIAG:  Diagnosis  I69.398,R25.2 (ICD-10-CM) - Spasticity as late effect of cerebrovascular accident (CVA)    THERAPY DIAG:  Muscle weakness (generalized)  Unsteadiness on feet  Other lack of coordination  Difficulty in walking, not elsewhere classified  Abnormality of gait and mobility  Other abnormalities of gait and mobility  Rationale for Evaluation and Treatment: Rehabilitation  SUBJECTIVE:  SUBJECTIVE STATEMENT:  03/28/24 Pt answers yes/no: reports no stumbles/falls, and no pain. No family present today.  Arrives late to PT treatment, unable to explain why.  No pain. No falls. Responds with yes when asked if facility staff is performing stand pivot transfers to toilet.    From EVAL: Daughter present for Evaluation to provide hx. Pt is familiar to this clinic and has been seen for multiple bouts PT since initial CVA in 2021. Pt d/c'ed from PT serviced in April of 2024 with plans to receive PT services through [facility]. Daughter reports that she was only receiving minimal therapy in facility. Reports at least 1 fall in the last 6 months and daughter asked staff to transfer pt with lift since fall.  Daughter would want pt to demonstrate improve safety with transfers to allow bil transfers with reduced use of rail to allow pt to return home for assisted living.  Unable to discern whether pt in SNF or assisted living care from hx.    Pt accompanied by: self   PERTINENT HISTORY:  1. Acute Ischemic Left MCA Stroke:Right Hemiplegia: Ordered outpatient OT, PT, and SLP. She has had a HFU appointment with Neurology. Discussed that I can provide new scripts next month if needed. Communicated with her providers regarding positive reinforcement. She has made excellent gains in strength  as well as cognition and speech!   -Provided with new disability placard.    2. Essential Hypertension: BP is currently very elevated and discussed with patient that it would be unsafe for her to receive injections today as the injections can temporarily increase her pain, which can increase her blood pressure, and put her at risk for stroke. Discussed starting the medication Valsartan  40mg  daily and logging blood pressures daily at home and she is agreeable. Medications reviewed and she is taking Amlodipine  5mg  and lopressor .  Provided with a list of foods that will help lower her BP.  BP reviewed and improved at 136/82 -continue clonidine -Advised checking BP daily at home and logging results to bring into follow-up appointment with PCP and myself. -Reviewed BP meds today.  -Advised regarding healthy foods that can help lower blood pressure and provided with a list:  PAIN:  Are you having pain? No  PRECAUTIONS: Fall  RED FLAGS: None   WEIGHT BEARING RESTRICTIONS: No  FALLS: Has patient fallen in last 6 months? Yes. Number of falls 1  LIVING ENVIRONMENT: Lives with: lives with their family and lives in a skilled nursing facility Lives in: Other SNF  Stairs: No Has following equipment at home: Hemi walker and Wheelchair (manual)  PLOF: Requires assistive device for independence, Needs assistance with ADLs, Needs assistance with homemaking, and WC level currently   PATIENT GOALS: move better - walking or transferring with hemi walking   OBJECTIVE:  Note: Objective measures were completed at Evaluation unless otherwise noted.  DIAGNOSTIC FINDINGS:  Head CT:  IMPRESSION: 1. No acute intracranial abnormality. 2. No acute displaced fracture or traumatic listhesis of the cervical spine.  Cervical CT:  MPRESSION: 1. No acute intracranial abnormality. 2. No acute displaced fracture or traumatic listhesis of the cervical spine.  COGNITION: Overall cognitive status: History of  cognitive impairments - at baseline   SENSATION: Light touch: Impaired  Proprioception: Impaired  Able to detect depe pressure   COORDINATION: Increased tone on the RLE   EDEMA:  Mild RLE distal edema   MUSCLE TONE: RLE: Mild and Moderate  MUSCLE LENGTH: Hamstrings: grossly limited  POSTURE: rounded shoulders, forward head, and posterior bias   LOWER EXTREMITY ROM:     Active  Right Eval Left Eval  Hip flexion  Limited to 5 deg beyond 90 in sitting  Knee flexion  90deg in sitting  Knee extension  Lacking 10 deg full extension  Ankle dorsiflexion  none  Ankle plantarflexion  none   (Blank rows = not tested)  LOWER EXTREMITY MMT:    MMT Right Eval Left Eval  Hip flexion  2+  Hip extension    Hip abduction  2  Hip adduction  3  Hip internal rotation    Hip external rotation    Knee flexion  2+  Knee extension  3+  Ankle dorsiflexion  0  Ankle plantarflexion  0  Ankle inversion    Ankle eversion    (Blank rows = not tested)  BED MOBILITY:  Sit to supine Mod A Supine to sit Mod A Rolling to Right Min A Rolling to Left Min A  TRANSFERS: Assistive device utilized: Hemi walker  Sit to stand: Mod A Stand to sit: Mod A Chair to chair: Mod A Floor: unable to perform     CURB:  Level of Assistance: Total A Assistive device utilized: rail Curb Comments: unable to perform   STAIRS: Unable to perform at eval   GAIT: Gait pattern: non-functional constant posterior bias , step to pattern, decreased stance time- Right, Right hip hike, lateral hip instability, and lateral lean- Left Distance walked: 24ft Assistive device utilized: Hemi walker Level of assistance: Mod A Comments: moderate cues for sequencing of HW and gait pattern in turns   FUNCTIONAL TESTS:  5 times sit to stand: 48 sec with mod Assist from PT  Timed up and go (TUG): unable to perform  10 meter walk test: 6 ft with min-mod assist and HW.  FIST:  Function In Sitting Test (FIST)   (1/2 femur on surface; hips/knees flexed to 90deg)   - indicate bed or mat table / step stool if used  SCORING KEY: 4 = Independent (completes task independently & successfully) 3 = Verbal Cues/Increased Time (completes task independently & successfully and only needs more time/cues) 2 = Upper Extremity Support (must use UE for support or assistance to complete successfully) 1 = Needs Assistance (unable to complete w/o physical assist; DOCUMENT LEVEL: min, mod, max) 0 = Dependent (requires complete physical assist; unable to complete successfully even w/ physical assist)  Randomly Administer Once Throughout Exam  4 - Anterior Nudge (superior sternum)  4 - Posterior Nudge (between scapular spines)  4 - Lateral Nudge (to dominant side at acromion)     4 - Static sitting (30 seconds)  4 - Sitting, shake 'no' (left and right)  4 - Sitting, eyes closed (30 seconds)   3 - Sitting, lift foot (dominant side, lift foot 1 inch twice)    4 - Pick up object from behind (object at midline, hands breadth posterior)  1 mod assist - Forward reach (use dominant arm, must complete full motion) 1 mod assist - Lateral reach (use dominant arm, clear opposite ischial tuberosity) 1 mod assist  - Pick up object from floor (from between feet)   1 mod assist - Posterior scooting (move backwards 2 inches)  1 min assist  - Anterior scooting (move forward 2 inches)  1 min assist  - Lateral scooting (move to dominant side 2 inches)    11/21/23 TOTAL = 37/56  02/13/24 TOTAL = 46/56   Notes/comments:  apraxia limiting success of scooting and reacting tasks.  MCD > 5 points MCID for IP REHAB > 6 points      PATIENT SURVEYS:  To be completed                                                                                                                               TREATMENT DATE: 03/28/2024  PT applied gait belt for safety and CGA-min assist for safety throughout session unless otherwise noted.    TA:  Sit<>stand at rail with cues to push from arm rest x 15 throughout session. (VC for hand placement on 9/15 trials)- at times poor eccentric control- did better with VC to sit back down slowly.  Standing weight shift R and L x 20 with UE support on the Rail.   Standing pregait step forward/reverse with 1 LE x 20 bil with rest break between bouts.   Standing with lateral weight shifting L to R x 20  reps-  Dynamic high knee march - 10 reps then switch- VC to slow down for optimal muscle control   Side stepping R rail x 3 bil with UE supported on rail and increased assist for L weight shift to allow clearance on the RLE.   Forward reverse gait with HHA on the LUE 9ft x 2. Mod assist intermittently for improved weight shift and to prevent posterior LOB    Active-LAQ x 20 RLE into knee flex (some assist for ROM)  Seated quad stretch 3 x 45 sec with overpressure from PT.      PATIENT EDUCATION: Education details: .  Pt educated throughout session about proper posture and technique with exercises. Improved exercise technique, movement at target joints, use of target muscles after min to mod verbal, visual, tactile cues  Person educated: Patient Education method: Explanation Education comprehension: verbalized understanding  HOME EXERCISE PROGRAM: Access Code: [email or web address] URL: [email or web address] Date: 12/05/2023 Prepared by: Massie Dollar  Exercises - Sit to Stand with Counter Support  - 1 x daily - 3-4 x weekly - 2 sets - 10 reps - Seated Long Arc Quad  - 1 x daily - 3-4 x weekly - 2 sets - 10 reps - 2 hold - Seated March  - 1 x daily - 3-4 x weekly - 2 sets - 10 reps - 2 hold - Standing Hip Abduction with Counter Support  - 1 x daily - 7 x weekly - 3 sets - 5 reps - Wide Stance with Counter Support  - 1 x daily - 7 x weekly - 3 sets - 2 reps - 30 hold - Seated Hip Abduction  - 1 x daily - 7 x weekly - 3 sets - 10 reps - Supine Bridge  - 1 x daily - 7 x weekly  - 3 sets - 10 reps - Supine Short Arc Quad  - 1 x daily - 7 x weekly - 3 sets - 10 reps - Bent  Knee Fallouts  - 1 x daily - 7 x weekly - 3 sets - 10 reps - Small Range Straight Leg Raise  - 1 x daily - 7 x weekly - 3 sets - 10 reps - Supine Heel Slide  - 1 x daily - 7 x weekly - 3 sets - 10 reps   GOALS: Goals reviewed with patient? Yes  SHORT TERM GOALS: Target date: 12/13/2023    Patient will be independent in home exercise program to improve strength/mobility for better functional independence with ADLs. Baseline: to be provided on visit 2: 12/28/2023- No caregiver with patient to determine if compliant with HEP Goal  5/13: daugher states that they are completing some exercises almost daily  status: IN PROGRESS   LONG TERM GOALS: Target date: 04/02/2024       2.  Patient (> 69 years old) will complete five times sit to stand test by 15 seconds and only min assist indicating an increased LE strength and improved balance. Baseline: 48 with mod assist from PT; 12/28/2023= 44 sec from EOM at lowest postion (approx 21 in) with Left UE support and CGA to min A from PT 5/13: 28.80 sec with HW from [facility]; min assist from PT for full erect standing Goal status: Progressing  3.  Patient will increase FIST score by > 6 points to demonstrate decreased fall risk during functional activities Baseline: 37 5/13: 46/56 Goal status: MET  4.  Patient will complete 10 meter walk test to with HW in less than 1 minute with min assist as to improve gait speed for better community ambulation and to reduce fall risk. Baseline: 6 ft with HW and mod assist; 12/28/2023=18 feet with HW- mod assist  5/13: 2:11 min with HW, min-mod assist.  Goal status: PROGRESSING  5.  Patient will perform stand pivot transfer to and from [facility] with HW and CGA for safety to allow safe transfer to toilet with daughter without use of rail on the wall. Baseline: mod assist and heavy use of rail. 12/28/2023= Min to Mod assist with  repetitive VC for technique 5/13: min assist overall with HW; mod assist initially both directions  to prevent posterior LOB. Goal status: Progressing  6.  Patient will improve bed mobility to min assist only to reduce care giver burden and improve safety of transfers  Baseline: mod-max assist 5/13: Min-mod assist for sit<>supine. Min assist only for RLE management for rolling Goal status: INITIAL  ASSESSMENT:  CLINICAL IMPRESSION:  Treatment limited secondary to patient with late arrival. Continue with transfer safety and efficiency along with RLE mobility with standing/stepping. She was able to activate her glutes better overall- minimal prompting but able to extend her R hip well and able to take safe steps backward with less overall cues.   Pt will benefit from continued skilled PT to address strength and balance deficits to improve safety for transfers, reduce care giver burden, and allow return to PLOF and possible return home from Assisted living.   OBJECTIVE IMPAIRMENTS: Abnormal gait, cardiopulmonary status limiting activity, decreased activity tolerance, decreased balance, decreased cognition, decreased coordination, decreased endurance, decreased knowledge of use of DME, decreased mobility, difficulty walking, decreased ROM, decreased strength, hypomobility, increased muscle spasms, impaired flexibility, impaired sensation, impaired tone, impaired UE functional use, improper body mechanics, and postural dysfunction.   ACTIVITY LIMITATIONS: carrying, lifting, bending, standing, squatting, stairs, transfers, bed mobility, continence, bathing, toileting, dressing, reach over head, hygiene/grooming, and locomotion level  PARTICIPATION LIMITATIONS: cleaning, interpersonal relationship, shopping, and community  activity  PERSONAL FACTORS: 3+ comorbidities: HTN, chronic CVA, Afib, Lupus  are also affecting patient's functional outcome.   REHAB POTENTIAL: Fair chronic CVA with poor progress  in prior interventions with this clinic  CLINICAL DECISION MAKING: Unstable/unpredictable  EVALUATION COMPLEXITY: High  PLAN:  PT FREQUENCY: 1-2x/week  PT DURATION: 12 weeks  PLANNED INTERVENTIONS: 97110-Therapeutic exercises, 97530- Therapeutic activity, [id number]- Neuromuscular re-education, 97535- Self Care, [id number]- Manual therapy, [phone]- Gait training, [phone]- Orthotic Fit/training, [phone]- Aquatic Therapy, [phone]- Splinting, [id number]- Electrical stimulation (unattended), [phone]- Electrical stimulation (manual), Patient/Family education, Balance training, Stair training, Joint mobilization, Vestibular training, Visual/preceptual remediation/compensation, Cognitive remediation, DME instructions, Wheelchair mobility training, Cryotherapy, and Moist heat  PLAN FOR NEXT SESSION:  -RECERT/PROGRESS NOTE NEXT SESSION -  R glute activation and strength during standing  Continue  standing balance.  Gait.  Transfers with decreased UE support and awareness of posterior LOB.    Chyrl London, PT Physical Therapist - [facility]  9:09 AM 03/28/24

## 2024-04-02 ENCOUNTER — Ambulatory Visit: Payer: Medicare Other | Admitting: Occupational Therapy

## 2024-04-02 ENCOUNTER — Ambulatory Visit: Payer: Medicare Other | Attending: Physical Medicine and Rehabilitation

## 2024-04-02 ENCOUNTER — Ambulatory Visit: Payer: Medicare Other | Admitting: Speech Pathology

## 2024-04-02 DIAGNOSIS — R262 Difficulty in walking, not elsewhere classified: Secondary | ICD-10-CM | POA: Insufficient documentation

## 2024-04-02 DIAGNOSIS — R2689 Other abnormalities of gait and mobility: Secondary | ICD-10-CM | POA: Insufficient documentation

## 2024-04-02 DIAGNOSIS — M6281 Muscle weakness (generalized): Secondary | ICD-10-CM | POA: Diagnosis present

## 2024-04-02 DIAGNOSIS — R2681 Unsteadiness on feet: Secondary | ICD-10-CM | POA: Insufficient documentation

## 2024-04-02 DIAGNOSIS — R278 Other lack of coordination: Secondary | ICD-10-CM | POA: Insufficient documentation

## 2024-04-02 DIAGNOSIS — R269 Unspecified abnormalities of gait and mobility: Secondary | ICD-10-CM | POA: Diagnosis present

## 2024-04-02 DIAGNOSIS — R41841 Cognitive communication deficit: Secondary | ICD-10-CM | POA: Insufficient documentation

## 2024-04-02 DIAGNOSIS — R482 Apraxia: Secondary | ICD-10-CM | POA: Insufficient documentation

## 2024-04-02 DIAGNOSIS — R4701 Aphasia: Secondary | ICD-10-CM | POA: Diagnosis present

## 2024-04-02 NOTE — Therapy (Signed)
 OUTPATIENT PHYSICAL THERAPY TREATMENT  Patient Name: Lindsey [name] MRN: [id number] DOB:11-Nov-1955, 69 y.o., female Today's Date: 04/02/2024  PCP: Lorel Maxie LABOR, MD  REFERRING PROVIDER: Lorilee Sven SQUIBB, MD   END OF SESSION:   PT End of Session - 04/02/24 0903     Visit Number 30    Number of Visits 34    Date for PT Re-Evaluation 04/02/24    Authorization Type Medicare/Medicaid    PT Start Time 0855    PT Stop Time 0925    PT Time Calculation (min) 30 min    Activity Tolerance Patient tolerated treatment well;No increased pain;Patient limited by fatigue          Past Medical History:  Diagnosis Date   Aphasia    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   REFERRING DIAG:  Diagnosis  I69.398,R25.2 (ICD-10-CM) -  Spasticity as late effect of cerebrovascular accident (CVA)    THERAPY DIAG:  Muscle weakness (generalized)  Unsteadiness on feet  Other lack of coordination  Difficulty in walking, not elsewhere classified  Abnormality of gait and mobility  Rationale for Evaluation and Treatment: Rehabilitation  SUBJECTIVE:                                                                                                                                                                                             SUBJECTIVE STATEMENT:  Pt denies pain, denies medical updates.  Affirms good weekend and good birthday.   Pt accompanied by: self   PERTINENT HISTORY:  Daughter present for Evaluation to provide hx. Pt is familiar to this clinic and has been seen for multiple bouts PT since initial CVA in 2021. Pt d/c'ed from PT serviced in April of 2024 with plans to receive PT services through [facility]. Daughter reports that she was only receiving minimal therapy in facility. Reports at least 1 fall in the last 6 months and daughter asked staff to transfer pt with lift since fall.  Daughter would want pt to demonstrate improve safety with transfers to allow bil transfers with reduced use of rail to allow pt to return home for assisted living.    PAIN:  Are you having pain? No  PRECAUTIONS: Fall  RED FLAGS: None   WEIGHT BEARING RESTRICTIONS: No  FALLS: Has patient fallen in last 6 months? Yes. Number of falls 1  LIVING ENVIRONMENT: Lives with: lives with their family and lives in a skilled nursing facility Lives in: Other SNF  Stairs: No Has following equipment at home: Hemi walker and Wheelchair (manual)  PLOF: Requires assistive device for independence, Needs assistance with ADLs, Needs assistance with homemaking, and WC level currently   PATIENT GOALS: move better - walking or transferring with hemi walking   OBJECTIVE:  Note: Objective measures were completed at Evaluation unless  otherwise noted.  DIAGNOSTIC FINDINGS:  Head CT:  IMPRESSION: 1. No acute intracranial abnormality. 2. No acute displaced fracture or traumatic listhesis of the cervical spine.  Cervical CT:  MPRESSION: 1. No acute intracranial abnormality. 2. No acute displaced fracture or traumatic listhesis of the cervical spine.  COGNITION: Overall cognitive status: History of cognitive impairments - at baseline   SENSATION: Light touch: Impaired  Proprioception: Impaired  Able to detect depe pressure   COORDINATION: Increased tone on the RLE   EDEMA:  Mild RLE distal edema   MUSCLE TONE: RLE: Mild and Moderate  MUSCLE LENGTH: Hamstrings: grossly limited   POSTURE: rounded shoulders, forward head, and posterior bias   LOWER EXTREMITY ROM:     Active  Right Eval Left Eval  Hip flexion  Limited to 5 deg beyond 90 in sitting  Knee flexion  90deg in sitting  Knee extension  Lacking 10 deg full extension  Ankle dorsiflexion  none  Ankle plantarflexion  none   (Blank rows = not tested)  LOWER EXTREMITY MMT:    MMT Right Eval Left Eval  Hip flexion  2+  Hip extension    Hip abduction  2  Hip adduction  3  Hip internal rotation    Hip external rotation    Knee flexion  2+  Knee extension  3+  Ankle dorsiflexion  0  Ankle plantarflexion  0  Ankle inversion    Ankle eversion    (Blank rows = not tested)  BED MOBILITY:  Sit to supine Mod A Supine to sit Mod A Rolling to Right Min A Rolling to Left Min A  TRANSFERS: Assistive device utilized: Hemi walker  Sit to stand: Mod A Stand to sit: Mod A Chair to chair: Mod A Floor: unable to perform   CURB:  Level of Assistance: Total A Assistive device utilized: rail Curb Comments: unable to perform   GAIT: Gait pattern: non-functional constant posterior bias , step to pattern, decreased stance time- Right, Right hip hike, lateral hip instability, and lateral lean- Left Distance walked: 38ft Assistive device  utilized: Hemi walker Level of  assistance: Mod A Comments: moderate cues for sequencing of HW and gait pattern in turns   FUNCTIONAL TESTS:  5 times sit to stand: 48 sec with mod Assist from PT  Timed up and go (TUG): unable to perform  10 meter walk test: 6 ft with min-mod assist and HW.  FIST:  Function In Sitting Test (FIST)                                                                                                                               TREATMENT DATE: 04/02/24  -STS from [facility] x1 with standing 60sec   Author blocks Rt foot in AFO at 90 degree knee flexion, minA lift at scapulae, Pt pushes off LUE @ WC armrest, then once in standing, transfers Left hand to Left HW stabilized with 10lb AW.  -STS from [facility] x1 with standing 60sec  -STS from WC x5 as above with LUE dual tasking activity, ball toss/grasp with intermittent stabilization on HW: several LOB requiring totalA recovery, delayed awareness of LOB  -load transfer from ground level + 8 inches to table Rt x20, then table Left x20 (1-4lbs)   PATIENT EDUCATION: Education details: .  Pt educated throughout session about proper posture and technique with exercises. Improved exercise technique, movement at target joints, use of target muscles after min to mod verbal, visual, tactile cues  Person educated: Patient Education method: Explanation Education comprehension: verbalized understanding  HOME EXERCISE PROGRAM: Access Code: [email or web address] URL: [email or web address] Date: 12/05/2023 Prepared by: Massie Dollar  Exercises - Sit to Stand with Counter Support  - 1 x daily - 3-4 x weekly - 2 sets - 10 reps - Seated Long Arc Quad  - 1 x daily - 3-4 x weekly - 2 sets - 10 reps - 2 hold - Seated March  - 1 x daily - 3-4 x weekly - 2 sets - 10 reps - 2 hold - Standing Hip Abduction with Counter Support  - 1 x daily - 7 x weekly - 3 sets - 5 reps - Wide Stance with Counter Support  - 1 x daily - 7 x weekly - 3 sets - 2  reps - 30 hold - Seated Hip Abduction  - 1 x daily - 7 x weekly - 3 sets - 10 reps - Supine Bridge  - 1 x daily - 7 x weekly - 3 sets - 10 reps - Supine Short Arc Quad  - 1 x daily - 7 x weekly - 3 sets - 10 reps - Bent Knee Fallouts  - 1 x daily - 7 x weekly - 3 sets - 10 reps - Small Range Straight Leg Raise  - 1 x daily - 7 x weekly - 3 sets - 10 reps - Supine Heel Slide  - 1 x daily - 7 x weekly - 3 sets - 10 reps  GOALS: Goals reviewed with patient? Yes  SHORT TERM GOALS: Target date: 12/13/2023  Patient will be independent in home exercise program to improve strength/mobility for better functional independence with ADLs. Baseline: to be provided on visit 2: 12/28/2023- No caregiver with patient to determine if compliant with HEP Goal  5/13: daugher states that they are completing some exercises almost daily  status: IN PROGRESS   LONG TERM GOALS: Target date: 04/02/2024  2.  Patient (> 58 years old) will complete five times sit to stand test by 15 seconds and only min assist indicating an increased LE strength and improved balance. Baseline: 48 with mod assist from PT; 12/28/2023= 44 sec from EOM at lowest postion (approx 21 in) with Left UE support and CGA to min A from PT 5/13: 28.80 sec with HW from [facility]; min assist from PT for full erect standing Goal status: Progressing  3.  Patient will increase FIST score by > 6 points to demonstrate decreased fall risk during functional activities Baseline: 37 5/13: 46/56 Goal status: MET  4.  Patient will complete 10 meter walk test to with HW in less than 1 minute with min assist as to improve gait speed for better community ambulation and to reduce fall risk. Baseline: 6 ft with HW and mod assist; 12/28/2023=18 feet with HW- mod assist  5/13: 2:11 min with HW, min-mod assist.  Goal status: PROGRESSING  5.  Patient will perform stand pivot transfer to and from [facility] with HW and CGA for safety to allow safe transfer to toilet with daughter  without use of rail on the wall. Baseline: mod assist and heavy use of rail. 12/28/2023= Min to Mod assist with repetitive VC for technique 5/13: min assist overall with HW; mod assist initially both directions  to prevent posterior LOB. Goal status: Progressing  6.  Patient will improve bed mobility to min assist only to reduce care giver burden and improve safety of transfers  Baseline: mod-max assist 5/13: Min-mod assist for sit<>supine. Min assist only for RLE management for rolling Goal status: INITIAL  ASSESSMENT:  CLINICAL IMPRESSION:  Continued with transfers training for improving strength and balance. Pt presenting near her baseline, wherein she still requires +1 person for standing and for transfers. Pt is able to stabilize with HW in a limited context, but I snot safely independent with HW. Pt will benefit from continued skilled PT to address strength and balance deficits to improve safety for transfers, reduce care giver burden, and allow return to PLOF and possible return home from Assisted living.   OBJECTIVE IMPAIRMENTS: Abnormal gait, cardiopulmonary status limiting activity, decreased activity tolerance, decreased balance, decreased cognition, decreased coordination, decreased endurance, decreased knowledge of use of DME, decreased mobility, difficulty walking, decreased ROM, decreased strength, hypomobility, increased muscle spasms, impaired flexibility, impaired sensation, impaired tone, impaired UE functional use, improper body mechanics, and postural dysfunction.   ACTIVITY LIMITATIONS: carrying, lifting, bending, standing, squatting, stairs, transfers, bed mobility, continence, bathing, toileting, dressing, reach over head, hygiene/grooming, and locomotion level  PARTICIPATION LIMITATIONS: cleaning, interpersonal relationship, shopping, and community activity  PERSONAL FACTORS: 3+ comorbidities: HTN, chronic CVA, Afib, Lupus  are also affecting patient's functional outcome.    REHAB POTENTIAL: Fair chronic CVA with poor progress in prior interventions with this clinic  CLINICAL DECISION MAKING: Unstable/unpredictable  EVALUATION COMPLEXITY: High  PLAN:  PT FREQUENCY: 1-2x/week  PT DURATION: 12 weeks  PLANNED INTERVENTIONS: 97110-Therapeutic exercises, 97530- Therapeutic activity, [id number]- Neuromuscular re-education, 97535- Self Care, [id number]- Manual therapy, [id number]- Gait training, [phone]- Orthotic Fit/training, [id number]- Aquatic Therapy, [id number]- Splinting, [id number]- Electrical stimulation (unattended), [id number]- Electrical  stimulation (manual), Patient/Family education, Balance training, Stair training, Joint mobilization, Vestibular training, Visual/preceptual remediation/compensation, Cognitive remediation, DME instructions, Wheelchair mobility training, Cryotherapy, and Moist heat     9:14 AM, 04/02/24 Peggye JAYSON Linear, PT, DPT Physical Therapist - [facility]  Outpatient Physical Therapy- Main Campus [phone]

## 2024-04-02 NOTE — Therapy (Addendum)
 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) 09/24/2021   Cerebral embolism with cerebral infarction 09/23/2021   Pyelonephritis 09/19/2021   Pain due to onychomycosis of toenails of both feet 11/19/2020   Normocytic anemia 05/31/2020   Acute ischemic left MCA stroke (HCC) 04/30/2020   Right hemiplegia (HCC) 04/30/2020   Cerebrovascular accident (HCC) 04/21/2020   Atrial fibrillation with RVR (HCC) 04/21/2020   Essential hypertension 04/21/2020   Alcohol abuse 04/21/2020   ONSET DATE: 09/19/21  REFERRING DIAG: CVA  THERAPY DIAG:  Muscle weakness (generalized)  Rationale for  Evaluation and Treatment: Rehabilitation  SUBJECTIVE:  SUBJECTIVE STATEMENT:  Pt. Arrived to treatment session alone today. Pt. C/o tightness in the R shoulder and R Bicep.  Pt Pt accompanied by: self, daughter  PERTINENT HISTORY: Pt. is a 68 y.o. 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 Pt. has Right sided weakness, and receptive, and expressive aphasia. Pt. has had a previous CVA with right sided weakness in July 2021. PMHx includes: AFib, Renal Artery thrombosis, situational depression, HTN, Pyelnephritis, Seizure activity, COnstipation, AKI. Vitamin D  deficiency, and urinary incontinence   PRECAUTIONS: None  WEIGHT BEARING RESTRICTIONS: No  PAIN:  Are you having pain? 0-2/10 Wong-Baker Facial Grimace Scale  FALLS: Has patient fallen in last 6 months? Yes. Number of falls 1-ER visit  LIVING ENVIRONMENT: Pt. resides at [facility]. Pt. has very supportive family Stairs: One level Has following equipment at home:  Equipment provided through [employer]  PLOF: Needs assistance with ADLs  PATIENT GOALS:  To improve ROM/flexibility in the RUE 2/2 increased tightness, and to improve ADL functioning in order to towards returning home.  OBJECTIVE:   Note: Objective measures were completed at Evaluation unless otherwise noted.  HAND DOMINANCE: Right  ADLs: Overall ADLs: Assist is provided from the staff at [facility]. Transfers/ambulation related to ADLs: Eating: independent, with set-up using the  left hand Grooming: Independent with set-up using the left hand UB Dressing: Increased assist required, Mod-MaxA doffing jacket LB Dressing: Increased assist required Toileting:  Staff Assists with toileting transfers, and toilet hygiene care. Bathing: Staff assists with bathing Tub Shower transfers: Requires staff assist Equipment: TBD  IADLs: Shopping: N/A Light housekeeping: Provided by  staff Meal Prep: 3 Meals a day provided. Pt. Eats in the main dining room when family visits, otherwise Pt. Eats in her room.  Community mobility: Relies on family for transportation to therapy. Per daughter- Transportation is provided for services not offered at the facility. Medication management: Provided by the facility Financial management: Daughter manages Handwriting: TBD  MOBILITY STATUS: Hx of falls  FUNCTIONAL OUTCOME MEASURES:  UPPER EXTREMITY ROM:    Passive ROM Left Eval AROM WFL Right Eval  Right 01/02/24 Right 02/08/24  Shoulder flexion  0(46) 25 (90) 0(94)  Shoulder abduction  0(64) 0 (90) 0(78)  Shoulder adduction      Shoulder extension      Shoulder internal rotation      Shoulder external rotation   (25) 0(25)  Elbow flexion  0(98) 0(131) 0(133)  Elbow extension  0(full passive) 0 (full passive) 0(full passive)  Wrist flexion  0(56) 0(90) 0(74)  Wrist extension  -56(30) 0 (40) 6(42)  Wrist ulnar deviation      Wrist radial deviation      Wrist pronation      Wrist supination      (Blank rows = not tested)  UPPER EXTREMITY MMT:     MMT Left Eval 5/5 Right eval Right  01/02/24 Right 02/08/24  Shoulder flexion  0/5 2/5  0/5  Shoulder abduction  0/5 1/5 0/5  Shoulder adduction   2-/5 1/5  Shoulder extension      Shoulder internal rotation   1/5 0/5  Shoulder external rotation      Middle trapezius      Lower trapezius      Elbow flexion  0/5 0/5 0/5  Elbow extension  0/5 2/5 1/5  Wrist flexion   1/5   Wrist extension  0/5 3-/5 1/5  Wrist ulnar deviation      Wrist radial deviation      Wrist pronation  0/5 0/5 1/5  Wrist supination  0/5 0/5 0/5  Digit flexors   2-/5 2-/5  Digit extensors   0/5 0/5  (Blank rows = not tested)  HAND FUNCTION: N/A  COORDINATION: N/A  SENSATION: Diminished  EDEMA: No edema noted in the right hand today  MUSCLE TONE: impaired  COGNITION: Overall cognitive status: Aphasia  VISION: Wears glasses, no  change from baseline  PERCEPTION: TBD  PRAXIS: Impaired: Motor planning                                                                                                                           TREATMENT DATE: 04/02/24    Manual therapy:   -Pt. Tolerated Soft tissue massage to  the scapular, pectoral, right shoulder musculature, R Bicep, and R volar aspect of the forearm.  -Soft tissue mobilizations to promote radius on ulna motion needed for supination in conjunction with moist heat modality -Manual therapy was performed independent of, and in preparation for therapeutic Ex.  Therapeutic Exercise:   -PROM through the following joint ranges of the RUE following moist heat modality, including: shoulder flexion, abduction, ER, horizontal abduction, elbow flexion, extension, forearm supination, wrist extension, digit extension, and thumb abduction, working to increase RUE mobility to manage hemiparetic limb during basic ADLs, as well as reducing contracture risk. POLLY for R shoulder flex/ext, horizontal  abduction/adduction.   PATIENT EDUCATION: Education details: RUE PROM/AAROM, Pt./caregiver education about possible restorative ROM programs at SNF Person educated: Patient Education method: Explanation, Demonstration, Tactile cues, and Verbal cues Education comprehension: verbal cues required, tactile cues required, and needs further education  HOME EXERCISE PROGRAM: RUE self PROM  GOALS: Goals reviewed with patient? Yes  SHORT TERM GOALS: Target date: 01/16/2024    Pt. Will demonstrate supervision with HEPs for BUEs Baseline: Eval: No current HEPs; 01/02/24: Pt demos self PROM techniques throughout the RUE with intermittent min A and mod vc for accuracy 02/08/24: Pt. requires cues for initiation, and hand over hand assist. 02/20/24:  Goal status: in progress  LONG TERM GOALS: Target date: 05/02/2024    1.  Pt. Will improve right shoulder flexion to 45* to assist with UE  dressing Baseline: Eval: Right: 0(46); 01/02/24: Right: 25 (90) 02/08/24: 0(94) 02/20/24: Limited. Assist required for UE dressing Goal status:  Ongoing  2.  Pt. Will improve with shoulder abduction by 20 degrees to assist with underarm care, and hygiene Baseline: Eval: Right 0(64); 01/02/24: Right 0 (90) 02/08/24: 0(78) 02/20/24: Limited. Assist required for underarm hygiene  Goal status: Ongoing  3.  Pt. Will improve right elbow flexion by 10 degrees to assist with hand to face patterns for grooming Baseline: Eval: Right: 0(98); 01/02/24: Right: 0(131) 02/08/24: 9(866) 02/20/24: limited 2/2 tightness Goal status: Ongoing  4.  Pt. Will elicit active wrist extension, and digit extension in preparation for rubbing lotion into the left forearm, and hand  Baseline: Eval: Right wrist: -56 (30); 01/02/24: R wrist: ext: actively to neutral/0 (passively to 40*); R digit extension: no active ext (passively [facility] for this task); unable to self apply lotion to L forearm 02/08/24: right wrist extension: 6(42) 02/20/24: Continue Goal status: I Ongoing  5.  Pt. Will perform UE dressing skills with MinA Baseline: Eval: Mod-MaxA doffing a jacket; 01/02/24: Mod A to don/doff a jacket 02/08/24: ModA to thread the right sleeve, minA to bring the shirt around the back of the neck, and donn the left sleeve. ModA to align seams. 02/20/24: ModA to thread the right sleeve, minA to bring the shirt around the back of the neck, and donn the left sleeve. ModA to align seams.  Goal status: Ongoing  6. Pt. Will require minA toileting transfers/ clothing management Baseline: Eval: Pt. requires increased assist from staff; 01/02/24: pt can transfer to standard height toilet with grab bar for support with min A, but requires mod A to transfer up from toilet; Pt consistently performs wc<>BSC transfer with min A, extra time, and vc for sequencing while using hemi walker; total A for clothing management. 02/08/24: MinA, Total assist clothing management  02/20/24: MinA, Total assist clothing management   Goal status: in progress  ASSESSMENT:  CLINICAL IMPRESSION:  Pt. Tolerated treatment today. Pt. continues to require hand-over-hand assist for  all ROM 2/2 limited activation of active muscle responses in the RUE, and hand. Pt. continues to present with increased tone, and tightness in right scapular musculature, right shoulder flexion, abduction, elbow flexion, wrist, and digit extension with MP flexion tightness limiting full MP extension. While performing PROM, Pt. Reports 2/10 pain on the volar aspect of the forearm. Pt. education was provided on frequency and repetitions on self ROM exercises. Pt. continues to benefit from OT services to normalize tone in the dominant RUE, improve ROM for underarm hygiene care and UE dressing, as well as to improve toileting care, and clothing negotiation tasks.    PERFORMANCE DEFICITS: in functional skills including ADLs, IADLs, coordination, dexterity, proprioception, sensation, ROM, strength, pain, Fine motor control, Gross motor control, balance, body mechanics, decreased knowledge of use of DME, and UE functional use, cognitive skills including problem solving, and psychosocial skills including coping strategies, environmental adaptation, and routines and behaviors.   IMPAIRMENTS: are limiting patient from ADLs, IADLs, and leisure.   CO-MORBIDITIES: may have co-morbidities  that affects occupational performance. Patient will benefit from skilled OT to address above impairments and improve overall function.  MODIFICATION OR ASSISTANCE TO COMPLETE EVALUATION: Min-Moderate modification of tasks or assist with assess necessary to complete an evaluation.  OT OCCUPATIONAL PROFILE AND HISTORY: Detailed assessment: Review of records and additional review of physical, cognitive, psychosocial history related to current functional performance.  CLINICAL DECISION MAKING: Moderate - several treatment options, min-mod  task modification necessary  REHAB POTENTIAL: Good  EVALUATION COMPLEXITY: Moderate    PLAN:  OT FREQUENCY: 1x/week  OT DURATION: 12 weeks  PLANNED INTERVENTIONS: 97168 OT Re-evaluation, 97535 self care/ADL training, 02889 therapeutic exercise, 97530 therapeutic activity, 97112 neuromuscular re-education, 97140 manual therapy, 97018 paraffin, 02989 moist heat, 97010 cryotherapy, 97034 contrast bath, passive range of motion, coping strategies training, patient/family education, and DME and/or AE instructions  RECOMMENDED OTHER SERVICES: PT, ST  CONSULTED AND AGREED WITH PLAN OF CARE: Patient  PLAN FOR NEXT SESSION: see above  Damien Nap, OTS   This entire session was performed under direct supervision and direction of a licensed therapist/therapist assistant . I have personally read, edited and approve of the note as written.   Richardson Otter, MS, OTR/L   04/02/2024, 11:17 AM

## 2024-04-04 ENCOUNTER — Ambulatory Visit: Payer: Medicare Other

## 2024-04-04 ENCOUNTER — Ambulatory Visit: Payer: Medicare Other | Admitting: Speech Pathology

## 2024-04-04 ENCOUNTER — Ambulatory Visit: Payer: Medicare Other | Admitting: Occupational Therapy

## 2024-04-04 NOTE — Addendum Note (Signed)
 Addended by: EMELIA PEGGYE BROCKS on: 04/04/2024 08:43 AM   Modules accepted: Orders

## 2024-04-09 ENCOUNTER — Ambulatory Visit: Payer: Medicare Other | Admitting: Speech Pathology

## 2024-04-09 ENCOUNTER — Ambulatory Visit: Payer: Medicare Other | Admitting: Occupational Therapy

## 2024-04-09 ENCOUNTER — Ambulatory Visit: Payer: Medicare Other

## 2024-04-09 DIAGNOSIS — R278 Other lack of coordination: Secondary | ICD-10-CM

## 2024-04-09 DIAGNOSIS — R262 Difficulty in walking, not elsewhere classified: Secondary | ICD-10-CM

## 2024-04-09 DIAGNOSIS — M6281 Muscle weakness (generalized): Secondary | ICD-10-CM | POA: Diagnosis not present

## 2024-04-09 DIAGNOSIS — R2681 Unsteadiness on feet: Secondary | ICD-10-CM

## 2024-04-09 DIAGNOSIS — R269 Unspecified abnormalities of gait and mobility: Secondary | ICD-10-CM

## 2024-04-09 DIAGNOSIS — R2689 Other abnormalities of gait and mobility: Secondary | ICD-10-CM

## 2024-04-09 NOTE — Therapy (Signed)
 OUTPATIENT PHYSICAL THERAPY TREATMENT/RECERT   Patient Name: Lindsey [name] MRN: [id number] DOB:01/30/1956, 68 y.o., female Today's Date: 04/10/2024  PCP: Lorel Maxie LABOR, MD  REFERRING PROVIDER: Lorilee Sven SQUIBB, MD   END OF SESSION:   PT End of Session - 04/09/24 0853     Visit Number 31    Number of Visits 55    Date for PT Re-Evaluation 07/02/24    Authorization Type Medicare/Medicaid    Progress Note Due on Visit 40    PT Start Time 0848    PT Stop Time 0929    PT Time Calculation (min) 41 min    Equipment Utilized During Treatment Gait belt    Activity Tolerance Patient tolerated treatment well;No increased pain;Patient limited by fatigue    Behavior During Therapy [facility] for tasks assessed/performed          Past Medical History:  Diagnosis Date   Aphasia    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 No pertinent surgical history. 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   REFERRING DIAG:  Diagnosis  I69.398,R25.2 (ICD-10-CM) - Spasticity as late effect of cerebrovascular accident (CVA)    THERAPY DIAG:  Muscle weakness (generalized) - Plan: PT plan of care cert/re-cert  Unsteadiness on feet - Plan: PT plan of care cert/re-cert  Other lack of coordination - Plan: PT plan of care cert/re-cert  Difficulty in walking, not elsewhere classified - Plan: PT plan of care cert/re-cert  Abnormality of gait and mobility - Plan: PT plan of care cert/re-cert  Other abnormalities of gait and mobility - Plan: PT plan of care cert/re-cert  Rationale for Evaluation and Treatment: Rehabilitation  SUBJECTIVE:  SUBJECTIVE STATEMENT:  Pt reports doing okay- no new issues- states she had a good birthday last week.  States trying to do her exercises as she can.   Pt accompanied by: self   PERTINENT HISTORY:  Daughter present for Evaluation to provide hx. Pt is familiar to this clinic and has been seen for multiple bouts PT since initial CVA in 2021. Pt d/c'ed from PT serviced in April of 2024 with plans to receive PT services through [employer] [facility]. Daughter reports that she was only receiving minimal therapy in facility. Reports at least 1 fall in the last 6 months and daughter asked staff to transfer pt with lift since fall.  Daughter would want pt to demonstrate improve safety with transfers to allow bil transfers with reduced use of rail to allow pt to return home for assisted living.    PAIN:  Are you having pain? No  PRECAUTIONS: Fall  RED FLAGS: None   WEIGHT BEARING RESTRICTIONS: No  FALLS: Has patient fallen in last 6 months? Yes. Number of falls 1  LIVING  ENVIRONMENT: Lives with: lives with their family and lives in a skilled nursing facility Lives in: Other SNF  Stairs: No Has following equipment at home: Hemi walker and Wheelchair (manual)  PLOF: Requires assistive device for independence, Needs assistance with ADLs, Needs assistance with homemaking, and WC level currently   PATIENT GOALS: move better - walking or transferring with hemi walking   OBJECTIVE:  Note: Objective measures were completed at Evaluation unless otherwise noted.  DIAGNOSTIC FINDINGS:  Head CT:  IMPRESSION: 1. No acute intracranial abnormality. 2. No acute displaced fracture or traumatic listhesis of the cervical spine.  Cervical CT:  MPRESSION: 1. No acute intracranial abnormality. 2. No acute displaced fracture or traumatic listhesis of the cervical spine.  COGNITION: Overall cognitive status: History of cognitive impairments - at baseline   SENSATION: Light touch: Impaired  Proprioception: Impaired  Able to detect depe pressure   COORDINATION: Increased tone on the RLE   EDEMA:  Mild RLE distal edema   MUSCLE TONE: RLE: Mild and Moderate  MUSCLE LENGTH: Hamstrings: grossly limited   POSTURE: rounded shoulders, forward head, and posterior bias   LOWER EXTREMITY ROM:     Active  Right Eval Left Eval  Hip flexion  Limited to 5 deg beyond 90 in sitting  Knee flexion  90deg in sitting  Knee extension  Lacking 10 deg full extension  Ankle dorsiflexion  none  Ankle plantarflexion  none   (Blank rows = not tested)  LOWER EXTREMITY MMT:    MMT Right Eval Left Eval  Hip flexion  2+  Hip extension    Hip abduction  2  Hip adduction  3  Hip internal rotation    Hip external rotation    Knee flexion  2+  Knee extension  3+  Ankle dorsiflexion  0  Ankle plantarflexion  0  Ankle inversion    Ankle eversion    (Blank rows = not tested)  BED MOBILITY:  Sit to supine Mod A Supine to sit Mod A Rolling to Right Min A Rolling to  Left Min A  TRANSFERS: Assistive device utilized: Hemi walker  Sit to stand: Mod A Stand to sit: Mod A Chair to chair: Mod A Floor: unable to perform   CURB:  Level of Assistance: Total A Assistive device utilized: rail Curb Comments: unable to perform   GAIT: Gait pattern: non-functional constant posterior bias , step to pattern, decreased stance  time- Right, Right hip hike, lateral hip instability, and lateral lean- Left Distance walked: 36ft Assistive device utilized: Hemi walker Level of assistance: Mod A Comments: moderate cues for sequencing of HW and gait pattern in turns   FUNCTIONAL TESTS:  5 times sit to stand: 48 sec with mod Assist from PT  Timed up and go (TUG): unable to perform  10 meter walk test: 6 ft with min-mod assist and HW.  FIST:                                                                                                                                TREATMENT DATE: 04/10/24   Physical therapy treatment session today consisted of completing assessment of goals and administration of testing as demonstrated and documented in flow sheet, treatment, and goals section of this note with plan for recertification. Addition treatments may be found below.   Self care/Home management:  Reviewed some seated therex and patient able to follow demonstration including active seated ham curl, LAQ, Hip abd/add, Hip flex, and later- static stand at bar focusing on erect posture.   Physical Performance Test or Measurement:  Pt performed 5 time sit<>stand (5xSTS): 26.56  sec with HW in front- pushing up using LUE on armrest of w/c- Min Assist for full erect standing (>15 sec indicates increased fall risk)    10 Meter Walk Test: Patient instructed to walk 10 meters (32.8 ft) as quickly and as safely as possible at their normal speed x2 and at a fast speed x2. Time measured from 2 meter mark to 8 meter mark to accommodate ramp-up and ramp-down.  Normal speed 1: Patient  ambulated approx 22 feet using HW with Min assist and mod VC for gait sequencing-then experienced LOB requiring Mod Assist to maintain standing. Average Normal speed: Will attempt again next visit Cut off scores: <0.4 m/s = household Ambulator, 0.4-0.8 m/s = limited community Ambulator, >0.8 m/s = community Ambulator, >1.2 m/s = crossing a street, <1.0 = increased fall risk MCID 0.05 m/s (small), 0.13 m/s (moderate), 0.06 m/s (significant)  (ANPTA Core Set of Outcome Measures for Adults with Neurologic Conditions, 2018)     PATIENT EDUCATION: Education details: .  Pt educated throughout session about proper posture and technique with exercises. Improved exercise technique, movement at target joints, use of target muscles after min to mod verbal, visual, tactile cues  Person educated: Patient Education method: Explanation Education comprehension: verbalized understanding  HOME EXERCISE PROGRAM: Access Code: [email or web address] URL: [email or web address] Date: 12/05/2023 Prepared by: Massie Dollar  Exercises - Sit to Stand with Counter Support  - 1 x daily - 3-4 x weekly - 2 sets - 10 reps - Seated Long Arc Quad  - 1 x daily - 3-4 x weekly - 2 sets - 10 reps - 2 hold - Seated March  - 1 x daily - 3-4 x weekly - 2 sets - 10 reps - 2 hold - Standing Hip  Abduction with Counter Support  - 1 x daily - 7 x weekly - 3 sets - 5 reps - Wide Stance with Counter Support  - 1 x daily - 7 x weekly - 3 sets - 2 reps - 30 hold - Seated Hip Abduction  - 1 x daily - 7 x weekly - 3 sets - 10 reps - Supine Bridge  - 1 x daily - 7 x weekly - 3 sets - 10 reps - Supine Short Arc Quad  - 1 x daily - 7 x weekly - 3 sets - 10 reps - Bent Knee Fallouts  - 1 x daily - 7 x weekly - 3 sets - 10 reps - Small Range Straight Leg Raise  - 1 x daily - 7 x weekly - 3 sets - 10 reps - Supine Heel Slide  - 1 x daily - 7 x weekly - 3 sets - 10 reps  GOALS: Goals reviewed with patient? Yes  SHORT TERM GOALS: Target  date: 05/10/2024  Patient will be independent in home exercise program to improve strength/mobility for better functional independence with ADLs. Baseline: to be provided on visit 2: 12/28/2023- No caregiver with patient to determine if compliant with HEP Goal  5/13: daugher states that they are completing some exercises almost daily; 04/09/2024= No caregiver present today to f/u with compliance of HEP- Patient verbalized yes and stated standing, moving my legs.  status: IN PROGRESS   LONG TERM GOALS: Target date: 07/02/2024  2.  Patient (> 46 years old) will complete five times sit to stand test by 15 seconds and only min assist indicating an increased LE strength and improved balance. Baseline: 48 with mod assist from PT; 12/28/2023= 44 sec from EOM at lowest postion (approx 21 in) with Left UE support and CGA to min A from PT 5/13: 28.80 sec with HW from [facility]; min assist from PT for full erect standing; 04/09/2024= 26.56 sec with HW in front- pushing up using LUE on armrest of w/c- Min Assist for full erect standing Goal status: Progressing  3.  Patient will increase FIST score by > 6 points to demonstrate decreased fall risk during functional activities Baseline: 37 5/13: 46/56 Goal status: MET  4.  Patient will complete 10 meter walk test to with HW in less than 1 minute with min assist as to improve gait speed for better community ambulation and to reduce fall risk. Baseline: 6 ft with HW and mod assist; 12/28/2023=18 feet with HW- mod assist  5/13: 2:11 min with HW, min-mod assist. 04/09/2024= Patient ambulated approx 22 feet using HW with Min assist and mod VC for gait sequencing-then experienced LOB requiring Mod Assist to maintain standing. Goal status: PROGRESSING  5.  Patient will perform stand pivot transfer to and from [facility] with HW and CGA for safety to allow safe transfer to toilet with daughter without use of rail on the wall. Baseline: mod assist and heavy use of rail. 12/28/2023= Min to  Mod assist with repetitive VC for technique 5/13: min assist overall with HW; mod assist initially both directions  to prevent posterior LOB. 04/09/2024- Min assist physically in both directions yet increased VC for safety- if provided more time- patient able to make correct movements 75% of time during transfers- still inconsistent with reaching back and pivoting on Right LE  Goal status: Progressing  6.  Patient will improve bed mobility to min assist only to reduce care giver burden and improve safety of transfers  Baseline:  mod-max assist 5/13: Min-mod assist for sit<>supine. Min assist only for RLE management for rolling; 04/09/2024- unable to test due to time but will retest next session  Goal status: ONGOING  ASSESSMENT:  CLINICAL IMPRESSION:  Patient presents with good motivation for today's recert visit. She continues to make some functional progress as seen by reduced time overall since eval with 5 time sit to stand. She is also performing sit to stand and pivot transfers with 1 person assist with less overall posterior LOB. Progress is slow yet improving- will reassess bed mobility next visit and retest 10 meter walk test as she had a LOB during testing. Patient's condition has the potential to improve in response to therapy. Maximum improvement is yet to be obtained. The anticipated improvement is attainable and reasonable in a generally predictable time.  Pt will benefit from continued skilled PT to address strength and balance deficits to improve safety for transfers, reduce care giver burden, and allow return to PLOF and possible return home from Assisted living.   OBJECTIVE IMPAIRMENTS: Abnormal gait, cardiopulmonary status limiting activity, decreased activity tolerance, decreased balance, decreased cognition, decreased coordination, decreased endurance, decreased knowledge of use of DME, decreased mobility, difficulty walking, decreased ROM, decreased strength, hypomobility, increased  muscle spasms, impaired flexibility, impaired sensation, impaired tone, impaired UE functional use, improper body mechanics, and postural dysfunction.   ACTIVITY LIMITATIONS: carrying, lifting, bending, standing, squatting, stairs, transfers, bed mobility, continence, bathing, toileting, dressing, reach over head, hygiene/grooming, and locomotion level  PARTICIPATION LIMITATIONS: cleaning, interpersonal relationship, shopping, and community activity  PERSONAL FACTORS: 3+ comorbidities: HTN, chronic CVA, Afib, Lupus  are also affecting patient's functional outcome.   REHAB POTENTIAL: Fair chronic CVA with poor progress in prior interventions with this clinic  CLINICAL DECISION MAKING: Unstable/unpredictable  EVALUATION COMPLEXITY: High  PLAN:  PT FREQUENCY: 1-2x/week  PT DURATION: 12 weeks  PLANNED INTERVENTIONS: 97110-Therapeutic exercises, 97530- Therapeutic activity, 97112- Neuromuscular re-education, 97535- Self Care, [id number]- Manual therapy, [phone]- Gait training, [phone]- Orthotic Fit/training, [phone]- Aquatic Therapy, [phone]- Splinting, [id number]- Electrical stimulation (unattended), [phone]- Electrical stimulation (manual), Patient/Family education, Balance training, Stair training, Joint mobilization, Vestibular training, Visual/preceptual remediation/compensation, Cognitive remediation, DME instructions, Wheelchair mobility training, Cryotherapy, and Moist heat    8:17 AM, 04/10/24 Chyrl London, PT Physical Therapist - [facility]  Outpatient Physical Therapy- Main Campus [phone]

## 2024-04-09 NOTE — Therapy (Addendum)
 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) 09/24/2021   Cerebral embolism with cerebral infarction 09/23/2021   Pyelonephritis 09/19/2021   Pain due to onychomycosis of toenails of both feet 11/19/2020   Normocytic anemia 05/31/2020   Acute ischemic left MCA stroke (HCC) 04/30/2020   Right hemiplegia (HCC) 04/30/2020   Cerebrovascular accident (HCC) 04/21/2020   Atrial fibrillation with RVR (HCC) 04/21/2020   Essential hypertension 04/21/2020   Alcohol abuse 04/21/2020   ONSET DATE: 09/19/21  REFERRING DIAG: CVA  THERAPY DIAG:  Muscle weakness (generalized)  Rationale for  Evaluation and Treatment: Rehabilitation  SUBJECTIVE:  SUBJECTIVE STATEMENT:  Pt. arrived to treatment session alone today. Pt. c/o tightness in the R shoulder and R Bicep.  Pt Pt accompanied by: self, daughter  PERTINENT HISTORY: Pt. is a 68 y.o. 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 Pt. has Right sided weakness, and receptive, and expressive aphasia. Pt. has had a previous CVA with right sided weakness in July 2021. PMHx includes: AFib, Renal Artery thrombosis, situational depression, HTN, Pyelnephritis, Seizure activity, COnstipation, AKI. Vitamin D  deficiency, and urinary incontinence   PRECAUTIONS: None  WEIGHT BEARING RESTRICTIONS: No  PAIN:  Are you having pain? 0-2/10 Wong-Baker Facial Grimace Scale  FALLS: Has patient fallen in last 6 months? Yes. Number of falls 1-ER visit  LIVING ENVIRONMENT: Pt. resides at [facility]. Pt. has very supportive family Stairs: One level Has following equipment at home:  Equipment provided through [employer]  PLOF: Needs assistance with ADLs  PATIENT GOALS:  To improve ROM/flexibility in the RUE 2/2 increased tightness, and to improve ADL functioning in order to towards returning home.  OBJECTIVE:   Note: Objective measures were completed at Evaluation unless otherwise noted.  HAND DOMINANCE: Right  ADLs: Overall ADLs: Assist is provided from the staff at [facility]. Transfers/ambulation related to ADLs: Eating: independent, with set-up using  the left hand Grooming: Independent with set-up using the left hand UB Dressing: Increased assist required, Mod-MaxA doffing jacket LB Dressing: Increased assist required Toileting:  Staff Assists with toileting transfers, and toilet hygiene care. Bathing: Staff assists with bathing Tub Shower transfers: Requires staff assist Equipment: TBD  IADLs: Shopping: N/A Light housekeeping: Provided by  staff Meal Prep: 3 Meals a day provided. Pt. Eats in the main dining room when family visits, otherwise Pt. Eats in her room.  Community mobility: Relies on family for transportation to therapy. Per daughter- Transportation is provided for services not offered at the facility. Medication management: Provided by the facility Financial management: Daughter manages Handwriting: TBD  MOBILITY STATUS: Hx of falls  FUNCTIONAL OUTCOME MEASURES:  UPPER EXTREMITY ROM:    Passive ROM Left Eval AROM WFL Right Eval  Right 01/02/24 Right 02/08/24  Shoulder flexion  0(46) 25 (90) 0(94)  Shoulder abduction  0(64) 0 (90) 0(78)  Shoulder adduction      Shoulder extension      Shoulder internal rotation      Shoulder external rotation   (25) 0(25)  Elbow flexion  0(98) 0(131) 0(133)  Elbow extension  0(full passive) 0 (full passive) 0(full passive)  Wrist flexion  0(56) 0(90) 0(74)  Wrist extension  -56(30) 0 (40) 6(42)  Wrist ulnar deviation      Wrist radial deviation      Wrist pronation      Wrist supination      (Blank rows = not tested)  UPPER EXTREMITY MMT:     MMT Left Eval 5/5 Right eval Right  01/02/24 Right 02/08/24  Shoulder flexion  0/5 2/5  0/5  Shoulder abduction  0/5 1/5 0/5  Shoulder adduction   2-/5 1/5  Shoulder extension      Shoulder internal rotation   1/5 0/5  Shoulder external rotation      Middle trapezius      Lower trapezius      Elbow flexion  0/5 0/5 0/5  Elbow extension  0/5 2/5 1/5  Wrist flexion   1/5   Wrist extension  0/5 3-/5 1/5  Wrist ulnar deviation      Wrist radial deviation      Wrist pronation  0/5 0/5 1/5  Wrist supination  0/5 0/5 0/5  Digit flexors   2-/5 2-/5  Digit extensors   0/5 0/5  (Blank rows = not tested)  HAND FUNCTION: N/A  COORDINATION: N/A  SENSATION: Diminished  EDEMA: No edema noted in the right hand today  MUSCLE TONE: impaired  COGNITION: Overall cognitive status: Aphasia  VISION: Wears glasses, no  change from baseline  PERCEPTION: TBD  PRAXIS: Impaired: Motor planning                                                                                                                           TREATMENT DATE: 04/09/24    Manual therapy:   -Pt. Tolerated Soft tissue massage  to the scapular, pectoral, right shoulder musculature, R Bicep, and R volar aspect of the forearm.  -Soft tissue mobilizations to promote radius on ulna motion needed for supination in conjunction with moist heat modality -Manual therapy was performed independent of, and in preparation for therapeutic Ex.  Therapeutic Exercise:   -Pt. Tolerated PROM through the following joint ranges of the RUE following moist heat modality, including: shoulder flexion, abduction,  elbow flexion, extension, working to increase RUE mobility to manage hemiparetic limb during basic ADLs, as well as reducing contracture risk.  Self-Care:   Pt. requires minA t/fs to the toilet with cues for hand placement, increased assist, and cues were required for returning to the chair from the toilet. Pt. required maxA to manage depends. modA-maxA hiking pants.  Pt. assists with hiking pants on the left side using the left hand with support in standing. Pt. Attempts to use the left hand to assist with hiking the right side of the pants, however has difficulty 2/2 limited reach.  PATIENT EDUCATION: Education details: RUE PROM/AAROM, Pt./caregiver education about possible restorative ROM programs at SNF Person educated: Patient Education method: Explanation, Demonstration, Tactile cues, and Verbal cues Education comprehension: verbal cues required, tactile cues required, and needs further education  HOME EXERCISE PROGRAM: RUE self PROM  GOALS: Goals reviewed with patient? Yes  SHORT TERM GOALS: Target date: 01/16/2024    Pt. Will demonstrate supervision with HEPs for BUEs Baseline: Eval: No current HEPs; 01/02/24: Pt demos self PROM techniques  throughout the RUE with intermittent min A and mod vc for accuracy 02/08/24: Pt. requires cues for initiation, and hand over hand assist. 02/20/24:  Goal status: in progress  LONG TERM GOALS: Target date: 05/02/2024    1.  Pt. Will improve right shoulder flexion to 45* to assist with UE dressing Baseline: Eval: Right: 0(46); 01/02/24: Right: 25 (90) 02/08/24: 0(94) 02/20/24: Limited. Assist required for UE dressing Goal status:  Ongoing  2.  Pt. Will improve with shoulder abduction by 20 degrees to assist with underarm care, and hygiene Baseline: Eval: Right 0(64); 01/02/24: Right 0 (90) 02/08/24: 0(78) 02/20/24: Limited. Assist required for underarm hygiene  Goal status: Ongoing  3.  Pt. Will improve right elbow flexion by 10 degrees to assist with hand to face patterns for grooming Baseline: Eval: Right: 0(98); 01/02/24: Right: 0(131) 02/08/24: 9(866) 02/20/24: limited 2/2 tightness Goal status: Ongoing  4.  Pt. Will elicit active wrist extension, and digit extension in preparation for rubbing lotion into the left forearm, and hand  Baseline: Eval: Right wrist: -56 (30); 01/02/24: R wrist: ext: actively to neutral/0 (passively to 40*); R digit extension: no active ext (passively [facility] for this task); unable to self apply lotion to L forearm 02/08/24: right wrist extension: 6(42) 02/20/24: Continue Goal status: I Ongoing  5.  Pt. Will perform UE dressing skills with MinA Baseline: Eval: Mod-MaxA doffing a jacket; 01/02/24: Mod A to don/doff a jacket 02/08/24: ModA to thread the right sleeve, minA to bring the shirt around the back of the neck, and donn the left sleeve. ModA to align seams. 02/20/24: ModA to thread the right sleeve, minA to bring the shirt around the back of the neck, and donn the left sleeve. ModA to align seams.  Goal status: Ongoing  6. Pt. Will require minA toileting transfers/ clothing management Baseline: Eval: Pt. requires increased assist from staff; 01/02/24: pt can transfer to standard  height toilet with grab bar for support with min A, but requires mod A  to transfer up from toilet; Pt consistently performs wc<>BSC transfer with min A, extra time, and vc for sequencing while using hemi walker; total A for clothing management. 02/08/24: MinA, Total assist clothing management 02/20/24: MinA, Total assist clothing management   Goal status: in progress  ASSESSMENT:  CLINICAL IMPRESSION:  Pt. tolerated treatment today pt. requires minA t/f to the toilet with cues for hand placement, increased assist, and cues were required for returning to the chair from the toilet. Pt. Required maxA to manage depends. modA-maxA hiking pants.  Pt. Is able to assist with hiking pants on the  left with the left hand, and attempts to assist with hiking pants on the right side with support in standing however has difficulty due to limited reach. Pt. may benefit from pullup style depends. Pt. continues to require hand-over-hand assist for all ROM 2/2 limited activation of active muscle responses in the RUE, and hand. Pt. continues to present with increased tone, and tightness in right scapular musculature, right shoulder flexion, abduction, elbow flexion, wrist, and digit extension with MP flexion tightness limiting full MP extension. While performing PROM, Pt. reports 2/10 pain on the volar aspect of the forearm, as well as in the right bicep. Pt. presents with limited ROM due to this pain and tightness during PROM shoulder abduction and elbow flexion. Pt. continues to benefit from OT services to normalize tone in the dominant RUE, improve ROM for underarm hygiene care and UE dressing, as well as to improve toileting care, and clothing negotiation tasks.    PERFORMANCE DEFICITS: in functional skills including ADLs, IADLs, coordination, dexterity, proprioception, sensation, ROM, strength, pain, Fine motor control, Gross motor control, balance, body mechanics, decreased knowledge of use of DME, and UE functional use,  cognitive skills including problem solving, and psychosocial skills including coping strategies, environmental adaptation, and routines and behaviors.   IMPAIRMENTS: are limiting patient from ADLs, IADLs, and leisure.   CO-MORBIDITIES: may have co-morbidities  that affects occupational performance. Patient will benefit from skilled OT to address above impairments and improve overall function.  MODIFICATION OR ASSISTANCE TO COMPLETE EVALUATION: Min-Moderate modification of tasks or assist with assess necessary to complete an evaluation.  OT OCCUPATIONAL PROFILE AND HISTORY: Detailed assessment: Review of records and additional review of physical, cognitive, psychosocial history related to current functional performance.  CLINICAL DECISION MAKING: Moderate - several treatment options, min-mod task modification necessary  REHAB POTENTIAL: Good  EVALUATION COMPLEXITY: Moderate    PLAN:  OT FREQUENCY: 1x/week  OT DURATION: 12 weeks  PLANNED INTERVENTIONS: 97168 OT Re-evaluation, 97535 self care/ADL training, 02889 therapeutic exercise, 97530 therapeutic activity, 97112 neuromuscular re-education, 97140 manual therapy, 97018 paraffin, 02989 moist heat, 97010 cryotherapy, 97034 contrast bath, passive range of motion, coping strategies training, patient/family education, and DME and/or AE instructions  RECOMMENDED OTHER SERVICES: PT, ST  CONSULTED AND AGREED WITH PLAN OF CARE: Patient  PLAN FOR NEXT SESSION: see above  Damien Nap, OTS   This entire session was performed under direct supervision and direction of a licensed therapist/therapist assistant . I have personally read, edited and approve of the note as written.   Richardson Otter, MS, OTR/L   04/09/2024, 1:36 PM

## 2024-04-11 ENCOUNTER — Ambulatory Visit: Payer: Medicare Other

## 2024-04-11 ENCOUNTER — Ambulatory Visit: Payer: Medicare Other | Admitting: Occupational Therapy

## 2024-04-11 ENCOUNTER — Ambulatory Visit: Payer: Medicare Other | Admitting: Speech Pathology

## 2024-04-11 DIAGNOSIS — R269 Unspecified abnormalities of gait and mobility: Secondary | ICD-10-CM

## 2024-04-11 DIAGNOSIS — R278 Other lack of coordination: Secondary | ICD-10-CM

## 2024-04-11 DIAGNOSIS — R2681 Unsteadiness on feet: Secondary | ICD-10-CM

## 2024-04-11 DIAGNOSIS — R2689 Other abnormalities of gait and mobility: Secondary | ICD-10-CM

## 2024-04-11 DIAGNOSIS — R4701 Aphasia: Secondary | ICD-10-CM

## 2024-04-11 DIAGNOSIS — M6281 Muscle weakness (generalized): Secondary | ICD-10-CM | POA: Diagnosis not present

## 2024-04-11 DIAGNOSIS — R41841 Cognitive communication deficit: Secondary | ICD-10-CM

## 2024-04-11 DIAGNOSIS — R262 Difficulty in walking, not elsewhere classified: Secondary | ICD-10-CM

## 2024-04-11 DIAGNOSIS — R482 Apraxia: Secondary | ICD-10-CM

## 2024-04-11 NOTE — Therapy (Signed)
 OUTPATIENT PHYSICAL THERAPY TREATMENT/RECERT   Patient Name: Lindsey [name] MRN: [id number] DOB:11-27-1955, 68 y.o., female Today's Date: 04/11/2024  PCP: Lorel Maxie LABOR, MD  REFERRING PROVIDER: Lorilee Sven SQUIBB, MD   END OF SESSION:   PT End of Session - 04/11/24 0901     Visit Number 32    Number of Visits 55    Date for PT Re-Evaluation 07/02/24    Authorization Type Medicare/Medicaid    Progress Note Due on Visit 40    PT Start Time 0859    PT Stop Time 0930    PT Time Calculation (min) 31 min    Equipment Utilized During Treatment Gait belt    Activity Tolerance Patient tolerated treatment well;No increased pain;Patient limited by fatigue    Behavior During Therapy [facility] for tasks assessed/performed          Past Medical History:  Diagnosis Date   Aphasia    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 No pertinent surgical history. 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   REFERRING DIAG:  Diagnosis  I69.398,R25.2 (ICD-10-CM) - Spasticity as late effect of cerebrovascular accident (CVA)    THERAPY DIAG:  Muscle weakness (generalized)  Unsteadiness on feet  Other lack of coordination  Difficulty in walking, not elsewhere classified  Abnormality of gait and mobility  Other abnormalities of gait and mobility  Apraxia  Aphasia  Cognitive communication deficit  Rationale for Evaluation and Treatment: Rehabilitation  SUBJECTIVE:  SUBJECTIVE STATEMENT: Patient reports doing okay without any new issues.   Pt accompanied by: self   PERTINENT HISTORY:  Daughter present for Evaluation to provide hx. Pt is familiar to this clinic and has been seen for multiple bouts PT since initial CVA in 2021. Pt d/c'ed from PT serviced in April of 2024 with plans to receive PT services through [facility]. Daughter reports that she was only receiving minimal therapy in facility. Reports at least 1 fall in the last 6 months and daughter asked staff to transfer pt with lift since fall.  Daughter would want pt to demonstrate improve safety with transfers to allow bil transfers with reduced use of rail to allow pt to return home for assisted living.    PAIN:  Are you having pain? No  PRECAUTIONS: Fall  RED FLAGS: None   WEIGHT BEARING RESTRICTIONS: No  FALLS: Has patient fallen in last 6 months? Yes. Number of falls 1  LIVING ENVIRONMENT: Lives with: lives with their family and lives in a skilled nursing facility Lives in: Other SNF  Stairs: No Has following equipment at home: Hemi walker and Wheelchair (manual)  PLOF: Requires assistive device for independence,  Needs assistance with ADLs, Needs assistance with homemaking, and WC level currently   PATIENT GOALS: move better - walking or transferring with hemi walking   OBJECTIVE:  Note: Objective measures were completed at Evaluation unless otherwise noted.  DIAGNOSTIC FINDINGS:  Head CT:  IMPRESSION: 1. No acute intracranial abnormality. 2. No acute displaced fracture or traumatic listhesis of the cervical spine.  Cervical CT:  MPRESSION: 1. No acute intracranial abnormality. 2. No acute displaced fracture or traumatic listhesis of the cervical spine.  COGNITION: Overall cognitive status: History of cognitive impairments - at baseline   SENSATION: Light touch: Impaired  Proprioception: Impaired  Able to detect depe pressure   COORDINATION: Increased tone on the RLE   EDEMA:  Mild RLE distal edema   MUSCLE TONE: RLE: Mild and Moderate  MUSCLE LENGTH: Hamstrings: grossly limited   POSTURE: rounded shoulders, forward head, and posterior bias   LOWER EXTREMITY ROM:     Active  Right Eval Left Eval  Hip flexion  Limited to 5 deg beyond 90 in sitting  Knee flexion  90deg in sitting  Knee extension  Lacking 10 deg full extension  Ankle dorsiflexion  none  Ankle plantarflexion  none   (Blank rows = not tested)  LOWER EXTREMITY MMT:    MMT Right Eval Left Eval  Hip flexion  2+  Hip extension    Hip abduction  2  Hip adduction  3  Hip internal rotation    Hip external rotation    Knee flexion  2+  Knee extension  3+  Ankle dorsiflexion  0  Ankle plantarflexion  0  Ankle inversion    Ankle eversion    (Blank rows = not tested)  BED MOBILITY:  Sit to supine Mod A Supine to sit Mod A Rolling to Right Min A Rolling to Left Min A  TRANSFERS: Assistive device utilized: Hemi walker  Sit to stand: Mod A Stand to sit: Mod A Chair to chair: Mod A Floor: unable to perform   CURB:  Level of Assistance: Total A Assistive device utilized: rail Curb Comments:  unable to perform   GAIT: Gait pattern: non-functional constant posterior bias , step to pattern, decreased stance time- Right, Right hip hike, lateral hip instability, and lateral lean- Left Distance walked: 30ft Assistive device utilized:  Hemi walker Level of assistance: Mod A Comments: moderate cues for sequencing of HW and gait pattern in turns   FUNCTIONAL TESTS:  5 times sit to stand: 48 sec with mod Assist from PT  Timed up and go (TUG): unable to perform  10 meter walk test: 6 ft with min-mod assist and HW.  FIST:                                                                                                                                TREATMENT DATE: 04/11/24   Patient arrived 14 min late for session- so abbreviated session. Will attempt 10 MWT next session multiple attempts.   TA:  Stand pivot transfers x 2 - Mod/max cues for pivot sequence- moslty hand/feet placement Static stand without UE support x 1 min x 2 trials- VC for erect posture and  Sit to stand with LUE pushing up from armrest x 8 (Inconsistent initially but did improve with practice)   Standing Hip ext- 2 x 10 ea LE  Standing hip abd 2x 10 ea LE (only able to achieve 6 reps with quality on RLE x 2 sets)  Standing hip March 2 x 10 ea LE  PATIENT EDUCATION: Education details: .  Pt educated throughout session about proper posture and technique with exercises. Improved exercise technique, movement at target joints, use of target muscles after min to mod verbal, visual, tactile cues  Person educated: Patient Education method: Explanation Education comprehension: verbalized understanding  HOME EXERCISE PROGRAM: Access Code: [email or web address] URL: [email or web address] Date: 12/05/2023 Prepared by: Massie Dollar  Exercises - Sit to Stand with Counter Support  - 1 x daily - 3-4 x weekly - 2 sets - 10 reps - Seated Long Arc Quad  - 1 x daily - 3-4 x weekly - 2 sets - 10 reps - 2 hold - Seated March   - 1 x daily - 3-4 x weekly - 2 sets - 10 reps - 2 hold - Standing Hip Abduction with Counter Support  - 1 x daily - 7 x weekly - 3 sets - 5 reps - Wide Stance with Counter Support  - 1 x daily - 7 x weekly - 3 sets - 2 reps - 30 hold - Seated Hip Abduction  - 1 x daily - 7 x weekly - 3 sets - 10 reps - Supine Bridge  - 1 x daily - 7 x weekly - 3 sets - 10 reps - Supine Short Arc Quad  - 1 x daily - 7 x weekly - 3 sets - 10 reps - Bent Knee Fallouts  - 1 x daily - 7 x weekly - 3 sets - 10 reps - Small Range Straight Leg Raise  - 1 x daily - 7 x weekly - 3 sets - 10 reps - Supine Heel Slide  - 1 x daily - 7 x weekly - 3 sets - 10 reps  GOALS: Goals reviewed  with patient? Yes  SHORT TERM GOALS: Target date: 05/10/2024  Patient will be independent in home exercise program to improve strength/mobility for better functional independence with ADLs. Baseline: to be provided on visit 2: 12/28/2023- No caregiver with patient to determine if compliant with HEP Goal  5/13: daugher states that they are completing some exercises almost daily; 04/09/2024= No caregiver present today to f/u with compliance of HEP- Patient verbalized yes and stated standing, moving my legs.  status: IN PROGRESS   LONG TERM GOALS: Target date: 07/02/2024  2.  Patient (> 65 years old) will complete five times sit to stand test by 15 seconds and only min assist indicating an increased LE strength and improved balance. Baseline: 48 with mod assist from PT; 12/28/2023= 44 sec from EOM at lowest postion (approx 21 in) with Left UE support and CGA to min A from PT 5/13: 28.80 sec with HW from [facility]; min assist from PT for full erect standing; 04/09/2024= 26.56 sec with HW in front- pushing up using LUE on armrest of w/c- Min Assist for full erect standing Goal status: Progressing  3.  Patient will increase FIST score by > 6 points to demonstrate decreased fall risk during functional activities Baseline: 37 5/13: 46/56 Goal status:  MET  4.  Patient will complete 10 meter walk test to with HW in less than 1 minute with min assist as to improve gait speed for better community ambulation and to reduce fall risk. Baseline: 6 ft with HW and mod assist; 12/28/2023=18 feet with HW- mod assist  5/13: 2:11 min with HW, min-mod assist. 04/09/2024= Patient ambulated approx 22 feet using HW with Min assist and mod VC for gait sequencing-then experienced LOB requiring Mod Assist to maintain standing. Goal status: PROGRESSING  5.  Patient will perform stand pivot transfer to and from [facility] with HW and CGA for safety to allow safe transfer to toilet with daughter without use of rail on the wall. Baseline: mod assist and heavy use of rail. 12/28/2023= Min to Mod assist with repetitive VC for technique 5/13: min assist overall with HW; mod assist initially both directions  to prevent posterior LOB. 04/09/2024- Min assist physically in both directions yet increased VC for safety- if provided more time- patient able to make correct movements 75% of time during transfers- still inconsistent with reaching back and pivoting on Right LE  Goal status: Progressing  6.  Patient will improve bed mobility to min assist only to reduce care giver burden and improve safety of transfers  Baseline: mod-max assist 5/13: Min-mod assist for sit<>supine. Min assist only for RLE management for rolling; 04/09/2024- unable to test due to time but will retest next session  Goal status: ONGOING  ASSESSMENT:  CLINICAL IMPRESSION:  Treatment was limited today due to patient late arrival. She presented well motivated and certainly improved her overall transfer technique with practice and able to stand and dynamically weight shift in standing with total dependence on LE weightbearing. She continues to have most difficulty with placement of RLE prior to standing due to ext tone and weak ability to flex foot- but much improved weight bearing on RLE.  Pt will benefit from continued  skilled PT to address strength and balance deficits to improve safety for transfers, reduce care giver burden, and allow return to PLOF and possible return home from Assisted living.   OBJECTIVE IMPAIRMENTS: Abnormal gait, cardiopulmonary status limiting activity, decreased activity tolerance, decreased balance, decreased cognition, decreased coordination, decreased endurance, decreased knowledge  of use of DME, decreased mobility, difficulty walking, decreased ROM, decreased strength, hypomobility, increased muscle spasms, impaired flexibility, impaired sensation, impaired tone, impaired UE functional use, improper body mechanics, and postural dysfunction.   ACTIVITY LIMITATIONS: carrying, lifting, bending, standing, squatting, stairs, transfers, bed mobility, continence, bathing, toileting, dressing, reach over head, hygiene/grooming, and locomotion level  PARTICIPATION LIMITATIONS: cleaning, interpersonal relationship, shopping, and community activity  PERSONAL FACTORS: 3+ comorbidities: HTN, chronic CVA, Afib, Lupus  are also affecting patient's functional outcome.   REHAB POTENTIAL: Fair chronic CVA with poor progress in prior interventions with this clinic  CLINICAL DECISION MAKING: Unstable/unpredictable  EVALUATION COMPLEXITY: High  PLAN:  PT FREQUENCY: 1-2x/week  PT DURATION: 12 weeks  PLANNED INTERVENTIONS: 97110-Therapeutic exercises, 97530- Therapeutic activity, [id number]- Neuromuscular re-education, 97535- Self Care, [id number]- Manual therapy, [phone]- Gait training, [phone]- Orthotic Fit/training, [phone]- Aquatic Therapy, [phone]- Splinting, [id number]- Electrical stimulation (unattended), [phone]- Electrical stimulation (manual), Patient/Family education, Balance training, Stair training, Joint mobilization, Vestibular training, Visual/preceptual remediation/compensation, Cognitive remediation, DME instructions, Wheelchair mobility training, Cryotherapy, and Moist heat   PLAN FOR NEXT SESSION:   -   R glute activation and strength during standing  Continue  standing balance.  Gait.  Transfers with decreased UE support and awareness of posterior LOB.     1:59 PM, 04/11/24 Chyrl London, PT Physical Therapist - [place] [facility]  Outpatient Physical Therapy- Main Campus [phone]

## 2024-04-16 ENCOUNTER — Ambulatory Visit: Payer: Medicare Other | Admitting: Occupational Therapy

## 2024-04-16 ENCOUNTER — Ambulatory Visit: Payer: Medicare Other

## 2024-04-16 ENCOUNTER — Ambulatory Visit: Payer: Medicare Other | Admitting: Speech Pathology

## 2024-04-16 DIAGNOSIS — M6281 Muscle weakness (generalized): Secondary | ICD-10-CM

## 2024-04-16 DIAGNOSIS — R2681 Unsteadiness on feet: Secondary | ICD-10-CM

## 2024-04-16 DIAGNOSIS — R269 Unspecified abnormalities of gait and mobility: Secondary | ICD-10-CM

## 2024-04-16 DIAGNOSIS — R2689 Other abnormalities of gait and mobility: Secondary | ICD-10-CM

## 2024-04-16 DIAGNOSIS — R278 Other lack of coordination: Secondary | ICD-10-CM

## 2024-04-16 DIAGNOSIS — R262 Difficulty in walking, not elsewhere classified: Secondary | ICD-10-CM

## 2024-04-16 NOTE — Therapy (Signed)
 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   REFERRING DIAG:  Diagnosis  I69.398,R25.2 (ICD-10-CM) - Spasticity as late effect of cerebrovascular accident (CVA)    THERAPY DIAG:  No diagnosis found.  Rationale for Evaluation and Treatment: Rehabilitation  SUBJECTIVE:                                                                                                                                                                                              SUBJECTIVE STATEMENT: Patient reports no falls and denies pain.   Pt accompanied by: self   PERTINENT HISTORY:  Daughter present for Evaluation to provide hx. Pt is familiar to this clinic and has been seen for multiple bouts PT since initial CVA in 2021. Pt d/c'ed from PT serviced in April of 2024 with plans to receive PT services through [facility]. Daughter reports that she was only receiving minimal therapy in facility. Reports at least 1 fall in the last 6 months and daughter asked staff to transfer pt with lift since fall.  Daughter would want pt to demonstrate improve safety with transfers to allow  bil transfers with reduced use of rail to allow pt to return home for assisted living.    PAIN:  Are you having pain? No  PRECAUTIONS: Fall  RED FLAGS: None   WEIGHT BEARING RESTRICTIONS: No  FALLS: Has patient fallen in last 6 months? Yes. Number of falls 1  LIVING ENVIRONMENT: Lives with: lives with their family and lives in a skilled nursing facility Lives in: Other SNF  Stairs: No Has following equipment at home: Hemi walker and Wheelchair (manual)  PLOF: Requires assistive device for independence, Needs assistance with ADLs, Needs assistance with homemaking, and WC level currently   PATIENT GOALS: move better - walking or transferring with hemi walking   OBJECTIVE:  Note: Objective measures were completed at Evaluation unless otherwise noted.  DIAGNOSTIC FINDINGS:  Head CT:  IMPRESSION: 1. No acute intracranial abnormality. 2. No acute displaced fracture or traumatic listhesis of the cervical spine.  Cervical CT:  MPRESSION: 1. No acute intracranial abnormality. 2. No acute displaced fracture or traumatic listhesis of the cervical spine.  COGNITION: Overall cognitive status: History of cognitive impairments - at baseline   SENSATION: Light touch: Impaired  Proprioception: Impaired  Able to detect depe pressure   COORDINATION: Increased tone  on the RLE   EDEMA:  Mild RLE distal edema   MUSCLE TONE: RLE: Mild and Moderate  MUSCLE LENGTH: Hamstrings: grossly limited   POSTURE: rounded shoulders, forward head, and posterior bias   LOWER EXTREMITY ROM:     Active  Right Eval Left Eval  Hip flexion  Limited to 5 deg beyond 90 in sitting  Knee flexion  90deg in sitting  Knee extension  Lacking 10 deg full extension  Ankle dorsiflexion  none  Ankle plantarflexion  none   (Blank rows = not tested)  LOWER EXTREMITY MMT:    MMT Right Eval Left Eval  Hip flexion  2+  Hip extension    Hip abduction  2  Hip adduction  3  Hip internal rotation    Hip external rotation    Knee flexion  2+  Knee extension  3+  Ankle dorsiflexion  0  Ankle plantarflexion  0  Ankle inversion    Ankle eversion    (Blank rows = not tested)  BED MOBILITY:  Sit to supine Mod A Supine to sit Mod A Rolling to Right Min A Rolling to Left Min A  TRANSFERS: Assistive device utilized: Hemi walker  Sit to stand: Mod A Stand to sit: Mod A Chair to chair: Mod A Floor: unable to perform   CURB:  Level of Assistance: Total A Assistive device utilized: rail Curb Comments: unable to perform   GAIT: Gait pattern: non-functional constant posterior bias , step to pattern, decreased stance time- Right, Right hip hike, lateral hip instability, and lateral lean- Left Distance walked: 58ft Assistive device utilized: Hemi walker Level of assistance: Mod A Comments: moderate cues for sequencing of HW and gait pattern in turns   FUNCTIONAL TESTS:  5 times sit to stand: 48 sec with mod Assist from PT  Timed up and go (TUG): unable to perform  10 meter walk test: 6 ft with min-mod assist and HW.  FIST:  TREATMENT DATE: 04/16/24      TA:  Stand pivot transfers x 3 - CGA to stand but min/Mod VC cues and tactile VC   for pivot sequence- using HW. Static stand with and some without UE support x 6 min total  VC for erect posture and some of time spent with one UE support, some with lateral weight shifting and some without UE support.   Sit to stand with LUE pushing up from armrest x 15 (Standing Hip ext- 2 x 10 ea LE  Step tap with LUE support on bar-  2 x 10 reps Standing hip abd 2x 10 ea LE (increased time required to perform - several VC for RLE)  Standing hip ext  x 10 ea LE (Increased time to perform)     NMR: Static stand - arranging magnet letters in order using left UE (focusing on dual task and improving posture and weight bearing through BLE.     PATIENT EDUCATION: Education details: .  Pt educated throughout session about proper posture and technique with exercises. Improved exercise technique, movement at target joints, use of target muscles after min to mod verbal, visual, tactile cues  Person educated: Patient Education method: Explanation Education comprehension: verbalized understanding  HOME EXERCISE PROGRAM: Access Code: [email or web address] URL: [email or web address] Date: 12/05/2023 Prepared by: Massie Dollar  Exercises - Sit to Stand with Counter Support  - 1 x daily - 3-4 x weekly - 2 sets - 10 reps - Seated Long Arc Quad  - 1 x daily - 3-4 x weekly - 2 sets - 10 reps - 2 hold - Seated March  - 1 x daily - 3-4 x weekly - 2 sets - 10 reps - 2 hold - Standing Hip Abduction with Counter Support  - 1 x daily - 7 x weekly - 3 sets - 5 reps - Wide Stance with Counter Support  - 1 x daily - 7 x weekly - 3 sets - 2 reps - 30 hold - Seated Hip Abduction  - 1 x daily - 7 x weekly - 3 sets - 10 reps - Supine Bridge  - 1 x daily - 7 x weekly - 3 sets - 10 reps - Supine Short Arc Quad  - 1 x daily - 7 x weekly - 3 sets - 10 reps - Bent Knee Fallouts  - 1 x daily - 7 x weekly - 3 sets - 10 reps - Small Range Straight Leg Raise  - 1 x daily - 7 x weekly - 3 sets - 10 reps - Supine  Heel Slide  - 1 x daily - 7 x weekly - 3 sets - 10 reps  GOALS: Goals reviewed with patient? Yes  SHORT TERM GOALS: Target date: 05/10/2024  Patient will be independent in home exercise program to improve strength/mobility for better functional independence with ADLs. Baseline: to be provided on visit 2: 12/28/2023- No caregiver with patient to determine if compliant with HEP Goal  5/13: daugher states that they are completing some exercises almost daily; 04/09/2024= No caregiver present today to f/u with compliance of HEP- Patient verbalized yes and stated standing, moving my legs.  status: IN PROGRESS   LONG TERM GOALS: Target date: 07/02/2024  2.  Patient (> 50 years old) will complete five times sit to stand test by 15 seconds and only min assist indicating an increased LE strength and improved balance. Baseline: 48 with mod assist from PT; 12/28/2023= 44 sec from EOM at lowest postion (approx  21 in) with Left UE support and CGA to min A from PT 5/13: 28.80 sec with HW from [facility]; min assist from PT for full erect standing; 04/09/2024= 26.56 sec with HW in front- pushing up using LUE on armrest of w/c- Min Assist for full erect standing Goal status: Progressing  3.  Patient will increase FIST score by > 6 points to demonstrate decreased fall risk during functional activities Baseline: 37 5/13: 46/56 Goal status: MET  4.  Patient will complete 10 meter walk test to with HW in less than 1 minute with min assist as to improve gait speed for better community ambulation and to reduce fall risk. Baseline: 6 ft with HW and mod assist; 12/28/2023=18 feet with HW- mod assist  5/13: 2:11 min with HW, min-mod assist. 04/09/2024= Patient ambulated approx 22 feet using HW with Min assist and mod VC for gait sequencing-then experienced LOB requiring Mod Assist to maintain standing. Goal status: PROGRESSING  5.  Patient will perform stand pivot transfer to and from [facility] with HW and CGA for safety to allow safe  transfer to toilet with daughter without use of rail on the wall. Baseline: mod assist and heavy use of rail. 12/28/2023= Min to Mod assist with repetitive VC for technique 5/13: min assist overall with HW; mod assist initially both directions  to prevent posterior LOB. 04/09/2024- Min assist physically in both directions yet increased VC for safety- if provided more time- patient able to make correct movements 75% of time during transfers- still inconsistent with reaching back and pivoting on Right LE  Goal status: Progressing  6.  Patient will improve bed mobility to min assist only to reduce care giver burden and improve safety of transfers  Baseline: mod-max assist 5/13: Min-mod assist for sit<>supine. Min assist only for RLE management for rolling; 04/09/2024- unable to test due to time but will retest next session  Goal status: ONGOING  ASSESSMENT:  CLINICAL IMPRESSION:  Patient presented with good motivation today and responded well with VC for improved transfers- in fact less VC overall today with patient exhibiting more recall of correct technique. She continues to have most difficulty with placement of RLE prior to standing due to ext tone and weak ability to flex foot- but much improved weight bearing on RLE.  Pt will benefit from continued skilled PT to address strength and balance deficits to improve safety for transfers, reduce care giver burden, and allow return to PLOF and possible return home from Assisted living.   OBJECTIVE IMPAIRMENTS: Abnormal gait, cardiopulmonary status limiting activity, decreased activity tolerance, decreased balance, decreased cognition, decreased coordination, decreased endurance, decreased knowledge of use of DME, decreased mobility, difficulty walking, decreased ROM, decreased strength, hypomobility, increased muscle spasms, impaired flexibility, impaired sensation, impaired tone, impaired UE functional use, improper body mechanics, and postural dysfunction.    ACTIVITY LIMITATIONS: carrying, lifting, bending, standing, squatting, stairs, transfers, bed mobility, continence, bathing, toileting, dressing, reach over head, hygiene/grooming, and locomotion level  PARTICIPATION LIMITATIONS: cleaning, interpersonal relationship, shopping, and community activity  PERSONAL FACTORS: 3+ comorbidities: HTN, chronic CVA, Afib, Lupus  are also affecting patient's functional outcome.   REHAB POTENTIAL: Fair chronic CVA with poor progress in prior interventions with this clinic  CLINICAL DECISION MAKING: Unstable/unpredictable  EVALUATION COMPLEXITY: High  PLAN:  PT FREQUENCY: 1-2x/week  PT DURATION: 12 weeks  PLANNED INTERVENTIONS: 97110-Therapeutic exercises, 97530- Therapeutic activity, [id number]- Neuromuscular re-education, 97535- Self Care, [id number]- Manual therapy, [id number]- Gait training, [id number]- Orthotic Fit/training, [id number]- Aquatic Therapy, [id number]- Splinting, [id number]-  Electrical stimulation (unattended), [phone]- Electrical stimulation (manual), Patient/Family education, Balance training, Stair training, Joint mobilization, Vestibular training, Visual/preceptual remediation/compensation, Cognitive remediation, DME instructions, Wheelchair mobility training, Cryotherapy, and Moist heat   PLAN FOR NEXT SESSION:   -  R glute activation and strength during standing  Continue  standing balance.  Gait.  Transfers with decreased UE support and awareness of posterior LOB.     7:57 AM, 04/16/24 Chyrl London, PT Physical Therapist - [facility]  Outpatient Physical Therapy- Main Campus [phone]

## 2024-04-16 NOTE — Therapy (Cosign Needed)
 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) 09/24/2021   Cerebral embolism with cerebral infarction 09/23/2021   Pyelonephritis 09/19/2021   Pain due to onychomycosis of toenails of both feet 11/19/2020   Normocytic anemia 05/31/2020   Acute ischemic left MCA stroke (HCC) 04/30/2020   Right hemiplegia (HCC) 04/30/2020   Cerebrovascular accident (HCC) 04/21/2020   Atrial fibrillation with RVR (HCC) 04/21/2020   Essential hypertension 04/21/2020   Alcohol abuse 04/21/2020   ONSET DATE: 09/19/21  REFERRING DIAG: CVA  THERAPY DIAG:  Muscle weakness (generalized)  Rationale for  Evaluation and Treatment: Rehabilitation  SUBJECTIVE:  SUBJECTIVE STATEMENT:  Pt. arrived to treatment session alone today. Pt. c/o tightness in the R shoulder and R Bicep.  Pt Pt accompanied by: self, daughter  PERTINENT HISTORY: Pt. is a 68 y.o. 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 Pt. has Right sided weakness, and receptive, and expressive aphasia. Pt. has had a previous CVA with right sided weakness in July 2021. PMHx includes: AFib, Renal Artery thrombosis, situational depression, HTN, Pyelnephritis, Seizure activity, COnstipation, AKI. Vitamin D  deficiency, and urinary incontinence   PRECAUTIONS: None  WEIGHT BEARING RESTRICTIONS: No  PAIN:  Are you having pain? 0-2/10 Wong-Baker Facial Grimace Scale  FALLS: Has patient fallen in last 6 months? Yes. Number of falls 1-ER visit  LIVING ENVIRONMENT: Pt. resides at [facility]. Pt. has very supportive family Stairs: One level Has following equipment at home:  Equipment provided through [employer]  PLOF: Needs assistance with ADLs  PATIENT GOALS:  To improve ROM/flexibility in the RUE 2/2 increased tightness, and to improve ADL functioning in order to towards returning home.  OBJECTIVE:   Note: Objective measures were completed at Evaluation unless otherwise noted.  HAND DOMINANCE: Right  ADLs: Overall ADLs: Assist is provided from the staff at [facility]. Transfers/ambulation related to ADLs: Eating: independent, with set-up  using the left hand Grooming: Independent with set-up using the left hand UB Dressing: Increased assist required, Mod-MaxA doffing jacket LB Dressing: Increased assist required Toileting:  Staff Assists with toileting transfers, and toilet hygiene care. Bathing: Staff assists with bathing Tub Shower transfers: Requires staff assist Equipment: TBD  IADLs: Shopping: N/A Light housekeeping: Provided by  staff Meal Prep: 3 Meals a day provided. Pt. Eats in the main dining room when family visits, otherwise Pt. Eats in her room.  Community mobility: Relies on family for transportation to therapy. Per daughter- Transportation is provided for services not offered at the facility. Medication management: Provided by the facility Financial management: Daughter manages Handwriting: TBD  MOBILITY STATUS: Hx of falls  FUNCTIONAL OUTCOME MEASURES:  UPPER EXTREMITY ROM:    Passive ROM Left Eval AROM WFL Right Eval  Right 01/02/24 Right 02/08/24  Shoulder flexion  0(46) 25 (90) 0(94)  Shoulder abduction  0(64) 0 (90) 0(78)  Shoulder adduction      Shoulder extension      Shoulder internal rotation      Shoulder external rotation   (25) 0(25)  Elbow flexion  0(98) 0(131) 0(133)  Elbow extension  0(full passive) 0 (full passive) 0(full passive)  Wrist flexion  0(56) 0(90) 0(74)  Wrist extension  -56(30) 0 (40) 6(42)  Wrist ulnar deviation      Wrist radial deviation      Wrist pronation      Wrist supination      (Blank rows = not tested)  UPPER EXTREMITY MMT:     MMT Left Eval 5/5 Right eval Right  01/02/24 Right 02/08/24  Shoulder flexion  0/5 2/5  0/5  Shoulder abduction  0/5 1/5 0/5  Shoulder adduction   2-/5 1/5  Shoulder extension      Shoulder internal rotation   1/5 0/5  Shoulder external rotation      Middle trapezius      Lower trapezius      Elbow flexion  0/5 0/5 0/5  Elbow extension  0/5 2/5 1/5  Wrist flexion   1/5   Wrist extension  0/5 3-/5 1/5  Wrist ulnar deviation      Wrist radial deviation      Wrist pronation  0/5 0/5 1/5  Wrist supination  0/5 0/5 0/5  Digit flexors   2-/5 2-/5  Digit extensors   0/5 0/5  (Blank rows = not tested)  HAND FUNCTION: N/A  COORDINATION: N/A  SENSATION: Diminished  EDEMA: No edema noted in the right hand today  MUSCLE TONE: impaired  COGNITION: Overall cognitive status: Aphasia  VISION: Wears glasses, no  change from baseline  PERCEPTION: TBD  PRAXIS: Impaired: Motor planning                                                                                                                           TREATMENT DATE: 04/16/24    Manual therapy:   -Pt. Tolerated Soft tissue  massage to the R scapular, pectoral, right shoulder musculature, R Bicep, and R volar aspect of the forearm.  -Soft tissue mobilizations to promote radius on ulna motion needed for supination in conjunction with moist heat modality. -Manual therapy was performed independent of, and in preparation for therapeutic Ex.  Therapeutic Exercise:   -Pt. Tolerated PROM through the following joint ranges of the RUE following moist heat modality, including: shoulder flexion, abduction,  elbow flexion, extension, working to increase RUE mobility to manage hemiparetic limb during basic ADLs, as well as reducing contracture risk. -Facilitated thumb abduction/adduction with muscle tapping.   PATIENT EDUCATION: Education details: RUE PROM/AAROM, Pt./caregiver education about possible restorative ROM programs at SNF Person educated: Patient Education method: Explanation, Demonstration, Tactile cues, and Verbal cues Education comprehension: verbal cues required, tactile cues required, and needs further education  HOME EXERCISE PROGRAM: RUE self PROM  GOALS: Goals reviewed with patient? Yes  SHORT TERM GOALS: Target date: 01/16/2024    Pt. Will demonstrate supervision with HEPs for BUEs Baseline: Eval: No current HEPs; 01/02/24: Pt demos self PROM techniques throughout the RUE with intermittent min A and mod vc for accuracy 02/08/24: Pt. requires cues for initiation, and hand over hand assist. 02/20/24:  Goal status: in progress  LONG TERM GOALS: Target date: 05/02/2024    1.  Pt. Will improve right shoulder flexion to 45* to assist with UE dressing Baseline: Eval: Right: 0(46); 01/02/24: Right: 25 (90) 02/08/24: 0(94) 02/20/24: Limited.  Assist required for UE dressing Goal status:  Ongoing  2.  Pt. Will improve with shoulder abduction by 20 degrees to assist with underarm care, and hygiene Baseline: Eval: Right 0(64); 01/02/24: Right 0 (90) 02/08/24: 0(78) 02/20/24: Limited. Assist required for underarm hygiene  Goal status: Ongoing  3.  Pt. Will improve right elbow flexion by 10 degrees to assist with hand to face patterns for grooming Baseline: Eval: Right: 0(98); 01/02/24: Right: 0(131) 02/08/24: 9(866) 02/20/24: limited 2/2 tightness Goal status: Ongoing  4.  Pt. Will elicit active wrist extension, and digit extension in preparation for rubbing lotion into the left forearm, and hand  Baseline: Eval: Right wrist: -56 (30); 01/02/24: R wrist: ext: actively to neutral/0 (passively to 40*); R digit extension: no active ext (passively [facility] for this task); unable to self apply lotion to L forearm 02/08/24: right wrist extension: 6(42) 02/20/24: Continue Goal status: I Ongoing  5.  Pt. Will perform UE dressing skills with MinA Baseline: Eval: Mod-MaxA doffing a jacket; 01/02/24: Mod A to don/doff a jacket 02/08/24: ModA to thread the right sleeve, minA to bring the shirt around the back of the neck, and donn the left sleeve. ModA to align seams. 02/20/24: ModA to thread the right sleeve, minA to bring the shirt around the back of the neck, and donn the left sleeve. ModA to align seams.  Goal status: Ongoing  6. Pt. Will require minA toileting transfers/ clothing management Baseline: Eval: Pt. requires increased assist from staff; 01/02/24: pt can transfer to standard height toilet with grab bar for support with min A, but requires mod A to transfer up from toilet; Pt consistently performs wc<>BSC transfer with min A, extra time, and vc for sequencing while using hemi walker; total A for clothing management. 02/08/24: MinA, Total assist clothing management 02/20/24: MinA, Total assist clothing management   Goal status: in  progress  ASSESSMENT:  CLINICAL IMPRESSION:  Pt. Tolerated treatment session well today. Pt. Caregiver requested that Pt. Finish treatment session earlier today. Pt. continues to  present with increased tone, and tightness in right scapular musculature, right shoulder flexion, abduction, elbow flexion, wrist, and digit extension with MP flexion tightness limiting full MP extension. While performing PROM, Pt. reports 2/10 pain on the volar aspect of the forearm, as well as in the right bicep. Pt. presents with limited ROM due to this pain and tightness during PROM shoulder abduction and elbow flexion. During facilitation of thumb abduction/ adduction using muscle tapping technique, Pt. Presents with no trace of muscle activation. Pt. continues to benefit from OT services to normalize tone in the dominant RUE, improve ROM for underarm hygiene care and UE dressing, as well as to improve toileting care, and clothing negotiation tasks.    PERFORMANCE DEFICITS: in functional skills including ADLs, IADLs, coordination, dexterity, proprioception, sensation, ROM, strength, pain, Fine motor control, Gross motor control, balance, body mechanics, decreased knowledge of use of DME, and UE functional use, cognitive skills including problem solving, and psychosocial skills including coping strategies, environmental adaptation, and routines and behaviors.   IMPAIRMENTS: are limiting patient from ADLs, IADLs, and leisure.   CO-MORBIDITIES: may have co-morbidities  that affects occupational performance. Patient will benefit from skilled OT to address above impairments and improve overall function.  MODIFICATION OR ASSISTANCE TO COMPLETE EVALUATION: Min-Moderate modification of tasks or assist with assess necessary to complete an evaluation.  OT OCCUPATIONAL PROFILE AND HISTORY: Detailed assessment: Review of records and additional review of physical, cognitive, psychosocial history related to current functional  performance.  CLINICAL DECISION MAKING: Moderate - several treatment options, min-mod task modification necessary  REHAB POTENTIAL: Good  EVALUATION COMPLEXITY: Moderate    PLAN:  OT FREQUENCY: 1x/week  OT DURATION: 12 weeks  PLANNED INTERVENTIONS: 97168 OT Re-evaluation, 97535 self care/ADL training, 02889 therapeutic exercise, 97530 therapeutic activity, 97112 neuromuscular re-education, 97140 manual therapy, 97018 paraffin, 02989 moist heat, 97010 cryotherapy, 97034 contrast bath, passive range of motion, coping strategies training, patient/family education, and DME and/or AE instructions  RECOMMENDED OTHER SERVICES: PT, ST  CONSULTED AND AGREED WITH PLAN OF CARE: Patient  PLAN FOR NEXT SESSION: see above  Damien Nap, OTS   This entire session was performed under direct supervision and direction of a licensed therapist/therapist assistant . I have personally read, edited and approve of the note as written.   Richardson Otter, MS, OTR/L   04/16/2024, 5:30 PM

## 2024-04-18 ENCOUNTER — Ambulatory Visit: Payer: Medicare Other

## 2024-04-18 ENCOUNTER — Ambulatory Visit: Payer: Medicare Other | Admitting: Speech Pathology

## 2024-04-18 ENCOUNTER — Ambulatory Visit: Payer: Medicare Other | Admitting: Occupational Therapy

## 2024-04-18 DIAGNOSIS — M6281 Muscle weakness (generalized): Secondary | ICD-10-CM

## 2024-04-18 DIAGNOSIS — R262 Difficulty in walking, not elsewhere classified: Secondary | ICD-10-CM

## 2024-04-18 DIAGNOSIS — R2681 Unsteadiness on feet: Secondary | ICD-10-CM

## 2024-04-18 DIAGNOSIS — R269 Unspecified abnormalities of gait and mobility: Secondary | ICD-10-CM

## 2024-04-18 DIAGNOSIS — R278 Other lack of coordination: Secondary | ICD-10-CM

## 2024-04-18 DIAGNOSIS — R2689 Other abnormalities of gait and mobility: Secondary | ICD-10-CM

## 2024-04-18 NOTE — Therapy (Signed)
 OUTPATIENT PHYSICAL THERAPY TREATMENT   Patient Name: Lindsey [name] MRN: [id number] DOB:May 15, 1956, 68 y.o., female Today's Date: 04/18/2024  PCP: Lorel Maxie LABOR, MD  REFERRING PROVIDER: Lorilee Sven SQUIBB, MD   END OF SESSION:   PT End of Session - 04/18/24 0850     Visit Number 34    Number of Visits 55    Date for PT Re-Evaluation 07/02/24    Authorization Type Medicare/Medicaid    Progress Note Due on Visit 40    PT Start Time [phone]    Equipment Utilized During Treatment Gait belt    Activity Tolerance Patient tolerated treatment well;No increased pain;Patient limited by fatigue    Behavior During Therapy [facility] for tasks assessed/performed           Past Medical History:  Diagnosis Date   Aphasia    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 No pertinent surgical history. 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   REFERRING DIAG:  Diagnosis  I69.398,R25.2 (ICD-10-CM) - Spasticity as late effect of cerebrovascular accident (CVA)    THERAPY DIAG:  Muscle weakness (generalized)  Unsteadiness on feet  Other lack of coordination  Difficulty in walking, not elsewhere classified  Abnormality of gait and mobility  Other abnormalities of gait and mobility  Rationale for Evaluation and Treatment: Rehabilitation  SUBJECTIVE:  SUBJECTIVE STATEMENT: Patient reports tired but doing okay.   Pt accompanied by: self   PERTINENT HISTORY:  Daughter present for Evaluation to provide hx. Pt is familiar to this clinic and has been seen for multiple bouts PT since initial CVA in 2021. Pt d/c'ed from PT serviced in April of 2024 with plans to receive PT services through [facility]. Daughter reports that she was only receiving minimal therapy in facility. Reports at least 1 fall in the last 6 months and daughter asked staff to transfer pt with lift since fall.  Daughter would want pt to demonstrate improve safety with transfers to allow bil transfers with reduced use of rail to allow pt to return home for assisted living.    PAIN:  Are you having pain? No  PRECAUTIONS: Fall  RED FLAGS: None   WEIGHT BEARING RESTRICTIONS: No  FALLS: Has patient fallen in last 6 months? Yes. Number of falls 1  LIVING ENVIRONMENT: Lives with: lives with their family and lives in a skilled nursing facility Lives in: Other SNF  Stairs: No Has following equipment at home: Hemi walker and Wheelchair (manual)  PLOF: Requires assistive device for independence, Needs assistance with ADLs, Needs assistance with homemaking, and WC level currently   PATIENT GOALS: move better - walking or  transferring with hemi walking   OBJECTIVE:  Note: Objective measures were completed at Evaluation unless otherwise noted.  DIAGNOSTIC FINDINGS:  Head CT:  IMPRESSION: 1. No acute intracranial abnormality. 2. No acute displaced fracture or traumatic listhesis of the cervical spine.  Cervical CT:  MPRESSION: 1. No acute intracranial abnormality. 2. No acute displaced fracture or traumatic listhesis of the cervical spine.  COGNITION: Overall cognitive status: History of cognitive impairments - at baseline   SENSATION: Light touch: Impaired  Proprioception: Impaired  Able to detect depe pressure   COORDINATION: Increased tone on the RLE   EDEMA:  Mild RLE distal edema   MUSCLE TONE: RLE: Mild and Moderate  MUSCLE LENGTH: Hamstrings: grossly limited   POSTURE: rounded shoulders, forward head, and posterior bias   LOWER EXTREMITY ROM:     Active  Right Eval Left Eval  Hip flexion  Limited to 5 deg beyond 90 in sitting  Knee flexion  90deg in sitting  Knee extension  Lacking 10 deg full extension  Ankle dorsiflexion  none  Ankle plantarflexion  none   (Blank rows = not tested)  LOWER EXTREMITY MMT:    MMT Right Eval Left Eval  Hip flexion  2+  Hip extension    Hip abduction  2  Hip adduction  3  Hip internal rotation    Hip external rotation    Knee flexion  2+  Knee extension  3+  Ankle dorsiflexion  0  Ankle plantarflexion  0  Ankle inversion    Ankle eversion    (Blank rows = not tested)  BED MOBILITY:  Sit to supine Mod A Supine to sit Mod A Rolling to Right Min A Rolling to Left Min A  TRANSFERS: Assistive device utilized: Hemi walker  Sit to stand: Mod A Stand to sit: Mod A Chair to chair: Mod A Floor: unable to perform   CURB:  Level of Assistance: Total A Assistive device utilized: rail Curb Comments: unable to perform   GAIT: Gait pattern: non-functional constant posterior bias , step to pattern, decreased stance time- Right,  Right hip hike, lateral hip instability, and lateral lean- Left Distance walked: 59ft Assistive device utilized: Hemi walker  Level of assistance: Mod A Comments: moderate cues for sequencing of HW and gait pattern in turns   FUNCTIONAL TESTS:  5 times sit to stand: 48 sec with mod Assist from PT  Timed up and go (TUG): unable to perform  10 meter walk test: 6 ft with min-mod assist and HW.  FIST:                                                                                                                                TREATMENT DATE: 04/18/24      THEREX:  -Seated knee ext 2.5# 2 x 10 reps (partial ROM on right)  -Seated Hip march 2.5# 2 x 10 reps (AA on right  LE on 1st set yet active on 2nd and VC for slow controlled motion)  Chair press ups using LUE- goal to clear bottom off chair- unable until PT assisted then able to perform with CGA  2x 10 reps  -seated ham curl with feet placed on bolster 2 x 10 reps AAROM -Seatd ham curl with PT lifting RLE up so foot could clear the floor- then AROM 2 x 10  -Seated hip swing LLE x 15 rep over spike ball  TA:  Stand pivot transfers x 3 - CGA to stand but min/Mod VC cues and tactile VC  for pivot sequence- using HHA today Sit to stand with LUE pushing up from armrest x 15         PATIENT EDUCATION: Education details: .  Pt educated throughout session about proper posture and technique with exercises. Improved exercise technique, movement at target joints, use of target muscles after min to mod verbal, visual, tactile cues  Person educated: Patient Education method: Explanation Education comprehension: verbalized understanding  HOME EXERCISE PROGRAM: Access Code: [email or web address] URL: [email or web address] Date: 12/05/2023 Prepared by: Massie Dollar  Exercises - Sit to Stand with Counter Support  - 1 x daily - 3-4 x weekly - 2 sets - 10 reps - Seated Long Arc Quad  - 1 x daily - 3-4 x weekly - 2 sets - 10 reps - 2  hold - Seated March  - 1 x daily - 3-4 x weekly - 2 sets - 10 reps - 2 hold - Standing Hip Abduction with Counter Support  - 1 x daily - 7 x weekly - 3 sets - 5 reps - Wide Stance with Counter Support  - 1 x daily - 7 x weekly - 3 sets - 2 reps - 30 hold - Seated Hip Abduction  - 1 x daily - 7 x weekly - 3 sets - 10 reps - Supine Bridge  - 1 x daily - 7 x weekly - 3 sets - 10 reps - Supine Short Arc Quad  - 1 x daily - 7 x weekly - 3 sets - 10 reps - Bent Knee Fallouts  - 1 x daily - 7 x weekly - 3 sets - 10  reps - Small Range Straight Leg Raise  - 1 x daily - 7 x weekly - 3 sets - 10 reps - Supine Heel Slide  - 1 x daily - 7 x weekly - 3 sets - 10 reps  GOALS: Goals reviewed with patient? Yes  SHORT TERM GOALS: Target date: 05/10/2024  Patient will be independent in home exercise program to improve strength/mobility for better functional independence with ADLs. Baseline: to be provided on visit 2: 12/28/2023- No caregiver with patient to determine if compliant with HEP Goal  5/13: daugher states that they are completing some exercises almost daily; 04/09/2024= No caregiver present today to f/u with compliance of HEP- Patient verbalized yes and stated standing, moving my legs.  status: IN PROGRESS   LONG TERM GOALS: Target date: 07/02/2024  2.  Patient (> 95 years old) will complete five times sit to stand test by 15 seconds and only min assist indicating an increased LE strength and improved balance. Baseline: 48 with mod assist from PT; 12/28/2023= 44 sec from EOM at lowest postion (approx 21 in) with Left UE support and CGA to min A from PT 5/13: 28.80 sec with HW from [facility]; min assist from PT for full erect standing; 04/09/2024= 26.56 sec with HW in front- pushing up using LUE on armrest of w/c- Min Assist for full erect standing Goal status: Progressing  3.  Patient will increase FIST score by > 6 points to demonstrate decreased fall risk during functional activities Baseline: 37 5/13:  46/56 Goal status: MET  4.  Patient will complete 10 meter walk test to with HW in less than 1 minute with min assist as to improve gait speed for better community ambulation and to reduce fall risk. Baseline: 6 ft with HW and mod assist; 12/28/2023=18 feet with HW- mod assist  5/13: 2:11 min with HW, min-mod assist. 04/09/2024= Patient ambulated approx 22 feet using HW with Min assist and mod VC for gait sequencing-then experienced LOB requiring Mod Assist to maintain standing. Goal status: PROGRESSING  5.  Patient will perform stand pivot transfer to and from [facility] with HW and CGA for safety to allow safe transfer to toilet with daughter without use of rail on the wall. Baseline: mod assist and heavy use of rail. 12/28/2023= Min to Mod assist with repetitive VC for technique 5/13: min assist overall with HW; mod assist initially both directions  to prevent posterior LOB. 04/09/2024- Min assist physically in both directions yet increased VC for safety- if provided more time- patient able to make correct movements 75% of time during transfers- still inconsistent with reaching back and pivoting on Right LE  Goal status: Progressing  6.  Patient will improve bed mobility to min assist only to reduce care giver burden and improve safety of transfers  Baseline: mod-max assist 5/13: Min-mod assist for sit<>supine. Min assist only for RLE management for rolling; 04/09/2024- unable to test due to time but will retest next session  Goal status: ONGOING  ASSESSMENT:  CLINICAL IMPRESSION:  Treatment today focused on LE Strengthening and safety with standing/transfers. Patient required less VC overall with sit to stand and improved transfer technique with improved consistency of hand placement overall today.  Pt will benefit from continued skilled PT to address strength and balance deficits to improve safety for transfers, reduce care giver burden, and allow return to PLOF and possible return home from Assisted  living.   OBJECTIVE IMPAIRMENTS: Abnormal gait, cardiopulmonary status limiting activity, decreased activity tolerance, decreased balance,  decreased cognition, decreased coordination, decreased endurance, decreased knowledge of use of DME, decreased mobility, difficulty walking, decreased ROM, decreased strength, hypomobility, increased muscle spasms, impaired flexibility, impaired sensation, impaired tone, impaired UE functional use, improper body mechanics, and postural dysfunction.   ACTIVITY LIMITATIONS: carrying, lifting, bending, standing, squatting, stairs, transfers, bed mobility, continence, bathing, toileting, dressing, reach over head, hygiene/grooming, and locomotion level  PARTICIPATION LIMITATIONS: cleaning, interpersonal relationship, shopping, and community activity  PERSONAL FACTORS: 3+ comorbidities: HTN, chronic CVA, Afib, Lupus  are also affecting patient's functional outcome.   REHAB POTENTIAL: Fair chronic CVA with poor progress in prior interventions with this clinic  CLINICAL DECISION MAKING: Unstable/unpredictable  EVALUATION COMPLEXITY: High  PLAN:  PT FREQUENCY: 1-2x/week  PT DURATION: 12 weeks  PLANNED INTERVENTIONS: 97110-Therapeutic exercises, 97530- Therapeutic activity, [id number]- Neuromuscular re-education, 97535- Self Care, [id number]- Manual therapy, [phone]- Gait training, [phone]- Orthotic Fit/training, [phone]- Aquatic Therapy, [phone]- Splinting, [id number]- Electrical stimulation (unattended), [phone]- Electrical stimulation (manual), Patient/Family education, Balance training, Stair training, Joint mobilization, Vestibular training, Visual/preceptual remediation/compensation, Cognitive remediation, DME instructions, Wheelchair mobility training, Cryotherapy, and Moist heat   PLAN FOR NEXT SESSION:   -  R glute activation and strength during standing  Continue  standing balance.  Gait.  Transfers with decreased UE support and awareness of posterior LOB.     9:33 AM,  04/18/24 Chyrl London, PT Physical Therapist - [facility]  Outpatient Physical Therapy- Main Campus [phone]

## 2024-04-23 ENCOUNTER — Ambulatory Visit: Payer: Medicare Other | Admitting: Occupational Therapy

## 2024-04-23 ENCOUNTER — Ambulatory Visit: Payer: Medicare Other

## 2024-04-23 ENCOUNTER — Ambulatory Visit: Payer: Medicare Other | Admitting: Speech Pathology

## 2024-04-25 ENCOUNTER — Ambulatory Visit: Payer: Medicare Other | Admitting: Speech Pathology

## 2024-04-25 ENCOUNTER — Telehealth: Payer: Self-pay

## 2024-04-25 ENCOUNTER — Ambulatory Visit: Admitting: Physical Therapy

## 2024-04-25 ENCOUNTER — Ambulatory Visit: Payer: Medicare Other

## 2024-04-25 ENCOUNTER — Ambulatory Visit: Payer: Medicare Other | Admitting: Occupational Therapy

## 2024-04-25 NOTE — Telephone Encounter (Signed)
 Patient Name: Lindsey [name] MRN: [id number] DOB:1955/10/13, 68 y.o., female Today's Date: 04/25/2024  Pt dtr contacted via telephone and author left voice mail informing of missed appointment and instructed to call back to main office number at [phone].        [name], PT 04/25/2024, 10:24 AM

## 2024-04-25 NOTE — Therapy (Incomplete)
 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   REFERRING DIAG:  Diagnosis  I69.398,R25.2 (ICD-10-CM) - Spasticity as late effect of cerebrovascular accident (CVA)    THERAPY DIAG:  No diagnosis found.  Rationale for Evaluation and Treatment: Rehabilitation  SUBJECTIVE:                                                                                                                                                                                              SUBJECTIVE STATEMENT: Patient reports tired but doing okay.   Pt accompanied by: self   PERTINENT HISTORY:  Daughter present for Evaluation to provide hx. Pt is familiar to this clinic and has been seen for multiple bouts PT since initial CVA in 2021. Pt d/c'ed from PT serviced in April of 2024 with plans to receive PT services through [facility]. Daughter reports that she was only receiving minimal therapy in facility. Reports at least 1 fall in the last 6 months and daughter asked staff to transfer pt with lift since fall.  Daughter would want pt to demonstrate improve safety with transfers to allow  bil transfers with reduced use of rail to allow pt to return home for assisted living.    PAIN:  Are you having pain? No  PRECAUTIONS: Fall  RED FLAGS: None   WEIGHT BEARING RESTRICTIONS: No  FALLS: Has patient fallen in last 6 months? Yes. Number of falls 1  LIVING ENVIRONMENT: Lives with: lives with their family and lives in a skilled nursing facility Lives in: Other SNF  Stairs: No Has following equipment at home: Hemi walker and Wheelchair (manual)  PLOF: Requires assistive device for independence, Needs assistance with ADLs, Needs assistance with homemaking, and WC level currently   PATIENT GOALS: move better - walking or transferring with hemi walking   OBJECTIVE:  Note: Objective measures were completed at Evaluation unless otherwise noted.  DIAGNOSTIC FINDINGS:  Head CT:  IMPRESSION: 1. No acute intracranial abnormality. 2. No acute displaced fracture or traumatic listhesis of the cervical spine.  Cervical CT:  MPRESSION: 1. No acute intracranial abnormality. 2. No acute displaced fracture or traumatic listhesis of the cervical spine.  COGNITION: Overall cognitive status: History of cognitive impairments - at baseline   SENSATION: Light touch: Impaired  Proprioception: Impaired  Able to detect depe pressure   COORDINATION: Increased tone on  the RLE   EDEMA:  Mild RLE distal edema   MUSCLE TONE: RLE: Mild and Moderate  MUSCLE LENGTH: Hamstrings: grossly limited   POSTURE: rounded shoulders, forward head, and posterior bias   LOWER EXTREMITY ROM:     Active  Right Eval Left Eval  Hip flexion  Limited to 5 deg beyond 90 in sitting  Knee flexion  90deg in sitting  Knee extension  Lacking 10 deg full extension  Ankle dorsiflexion  none  Ankle plantarflexion  none   (Blank rows = not tested)  LOWER EXTREMITY MMT:    MMT Right Eval Left Eval  Hip flexion  2+  Hip extension    Hip abduction  2  Hip adduction  3  Hip internal rotation    Hip external rotation    Knee flexion  2+  Knee extension  3+  Ankle dorsiflexion  0  Ankle plantarflexion  0  Ankle inversion    Ankle eversion    (Blank rows = not tested)  BED MOBILITY:  Sit to supine Mod A Supine to sit Mod A Rolling to Right Min A Rolling to Left Min A  TRANSFERS: Assistive device utilized: Hemi walker  Sit to stand: Mod A Stand to sit: Mod A Chair to chair: Mod A Floor: unable to perform   CURB:  Level of Assistance: Total A Assistive device utilized: rail Curb Comments: unable to perform   GAIT: Gait pattern: non-functional constant posterior bias , step to pattern, decreased stance time- Right, Right hip hike, lateral hip instability, and lateral lean- Left Distance walked: 65ft Assistive device utilized: Hemi walker Level of assistance: Mod A Comments: moderate cues for sequencing of HW and gait pattern in turns   FUNCTIONAL TESTS:  5 times sit to stand: 48 sec with mod Assist from PT  Timed up and go (TUG): unable to perform  10 meter walk test: 6 ft with min-mod assist and HW.  FIST:  TREATMENT DATE: 04/25/24      THEREX:  -Seated knee ext 2.5# 2 x 10 reps (partial ROM on right)  -Seated Hip march  2.5# 2 x 10 reps (AA on right  LE on 1st set yet active on 2nd and VC for slow controlled motion)  Chair press ups using LUE- goal to clear bottom off chair- unable until PT assisted then able to perform with CGA  2x 10 reps  -seated ham curl with feet placed on bolster 2 x 10 reps AAROM -Seatd ham curl with PT lifting RLE up so foot could clear the floor- then AROM 2 x 10  -Seated hip swing LLE x 15 rep over spike ball  TA:  Stand pivot transfers x 3 - CGA to stand but min/Mod VC cues and tactile VC  for pivot sequence- using HHA today Sit to stand with LUE pushing up from armrest x 15         PATIENT EDUCATION: Education details: .  Pt educated throughout session about proper posture and technique with exercises. Improved exercise technique, movement at target joints, use of target muscles after min to mod verbal, visual, tactile cues  Person educated: Patient Education method: Explanation Education comprehension: verbalized understanding  HOME EXERCISE PROGRAM: Access Code: [email or web address] URL: [email or web address] Date: 12/05/2023 Prepared by: Massie Dollar  Exercises - Sit to Stand with Counter Support  - 1 x daily - 3-4 x weekly - 2 sets - 10 reps - Seated Long Arc Quad  - 1 x daily - 3-4 x weekly - 2 sets - 10 reps - 2 hold - Seated March  - 1 x daily - 3-4 x weekly - 2 sets - 10 reps - 2 hold - Standing Hip Abduction with Counter Support  - 1 x daily - 7 x weekly - 3 sets - 5 reps - Wide Stance with Counter Support  - 1 x daily - 7 x weekly - 3 sets - 2 reps - 30 hold - Seated Hip Abduction  - 1 x daily - 7 x weekly - 3 sets - 10 reps - Supine Bridge  - 1 x daily - 7 x weekly - 3 sets - 10 reps - Supine Short Arc Quad  - 1 x daily - 7 x weekly - 3 sets - 10 reps - Bent Knee Fallouts  - 1 x daily - 7 x weekly - 3 sets - 10 reps - Small Range Straight Leg Raise  - 1 x daily - 7 x weekly - 3 sets - 10 reps - Supine Heel Slide  - 1 x daily - 7 x weekly - 3 sets -  10 reps  GOALS: Goals reviewed with patient? Yes  SHORT TERM GOALS: Target date: 05/10/2024  Patient will be independent in home exercise program to improve strength/mobility for better functional independence with ADLs. Baseline: to be provided on visit 2: 12/28/2023- No caregiver with patient to determine if compliant with HEP Goal  5/13: daugher states that they are completing some exercises almost daily; 04/09/2024= No caregiver present today to f/u with compliance of HEP- Patient verbalized yes and stated standing, moving my legs.  status: IN PROGRESS   LONG TERM GOALS: Target date: 07/02/2024  2.  Patient (> 15 years old) will complete five times sit to stand test by 15 seconds and only min assist indicating an increased LE strength and improved balance. Baseline: 48 with mod assist from PT; 12/28/2023= 44 sec from EOM at lowest postion (approx  21 in) with Left UE support and CGA to min A from PT 5/13: 28.80 sec with HW from [facility]; min assist from PT for full erect standing; 04/09/2024= 26.56 sec with HW in front- pushing up using LUE on armrest of w/c- Min Assist for full erect standing Goal status: Progressing  3.  Patient will increase FIST score by > 6 points to demonstrate decreased fall risk during functional activities Baseline: 37 5/13: 46/56 Goal status: MET  4.  Patient will complete 10 meter walk test to with HW in less than 1 minute with min assist as to improve gait speed for better community ambulation and to reduce fall risk. Baseline: 6 ft with HW and mod assist; 12/28/2023=18 feet with HW- mod assist  5/13: 2:11 min with HW, min-mod assist. 04/09/2024= Patient ambulated approx 22 feet using HW with Min assist and mod VC for gait sequencing-then experienced LOB requiring Mod Assist to maintain standing. Goal status: PROGRESSING  5.  Patient will perform stand pivot transfer to and from [facility] with HW and CGA for safety to allow safe transfer to toilet with daughter without use of  rail on the wall. Baseline: mod assist and heavy use of rail. 12/28/2023= Min to Mod assist with repetitive VC for technique 5/13: min assist overall with HW; mod assist initially both directions  to prevent posterior LOB. 04/09/2024- Min assist physically in both directions yet increased VC for safety- if provided more time- patient able to make correct movements 75% of time during transfers- still inconsistent with reaching back and pivoting on Right LE  Goal status: Progressing  6.  Patient will improve bed mobility to min assist only to reduce care giver burden and improve safety of transfers  Baseline: mod-max assist 5/13: Min-mod assist for sit<>supine. Min assist only for RLE management for rolling; 04/09/2024- unable to test due to time but will retest next session  Goal status: ONGOING  ASSESSMENT:  CLINICAL IMPRESSION:  Treatment today focused on LE Strengthening and safety with standing/transfers. Patient required less VC overall with sit to stand and improved transfer technique with improved consistency of hand placement overall today.  Pt will benefit from continued skilled PT to address strength and balance deficits to improve safety for transfers, reduce care giver burden, and allow return to PLOF and possible return home from Assisted living.   OBJECTIVE IMPAIRMENTS: Abnormal gait, cardiopulmonary status limiting activity, decreased activity tolerance, decreased balance, decreased cognition, decreased coordination, decreased endurance, decreased knowledge of use of DME, decreased mobility, difficulty walking, decreased ROM, decreased strength, hypomobility, increased muscle spasms, impaired flexibility, impaired sensation, impaired tone, impaired UE functional use, improper body mechanics, and postural dysfunction.   ACTIVITY LIMITATIONS: carrying, lifting, bending, standing, squatting, stairs, transfers, bed mobility, continence, bathing, toileting, dressing, reach over head,  hygiene/grooming, and locomotion level  PARTICIPATION LIMITATIONS: cleaning, interpersonal relationship, shopping, and community activity  PERSONAL FACTORS: 3+ comorbidities: HTN, chronic CVA, Afib, Lupus  are also affecting patient's functional outcome.   REHAB POTENTIAL: Fair chronic CVA with poor progress in prior interventions with this clinic  CLINICAL DECISION MAKING: Unstable/unpredictable  EVALUATION COMPLEXITY: High  PLAN:  PT FREQUENCY: 1-2x/week  PT DURATION: 12 weeks  PLANNED INTERVENTIONS: 97110-Therapeutic exercises, 97530- Therapeutic activity, 97112- Neuromuscular re-education, 97535- Self Care, [id number]- Manual therapy, [phone]- Gait training, [phone]- Orthotic Fit/training, [phone]- Aquatic Therapy, [phone]- Splinting, [id number]- Electrical stimulation (unattended), [phone]- Electrical stimulation (manual), Patient/Family education, Balance training, Stair training, Joint mobilization, Vestibular training, Visual/preceptual remediation/compensation, Cognitive remediation, DME instructions, Wheelchair mobility training, Cryotherapy,  and Moist heat   PLAN FOR NEXT SESSION:   -  R glute activation and strength during standing  Continue  standing balance.  Gait.  Transfers with decreased UE support and awareness of posterior LOB.     8:48 AM, 04/25/24 Chyrl London, PT Physical Therapist - [facility]  Outpatient Physical Therapy- Main Campus [phone]

## 2024-04-30 ENCOUNTER — Ambulatory Visit: Admitting: Occupational Therapy

## 2024-04-30 ENCOUNTER — Encounter: Admitting: Speech Pathology

## 2024-04-30 ENCOUNTER — Ambulatory Visit

## 2024-04-30 DIAGNOSIS — R2689 Other abnormalities of gait and mobility: Secondary | ICD-10-CM

## 2024-04-30 DIAGNOSIS — R2681 Unsteadiness on feet: Secondary | ICD-10-CM

## 2024-04-30 DIAGNOSIS — R278 Other lack of coordination: Secondary | ICD-10-CM

## 2024-04-30 DIAGNOSIS — R482 Apraxia: Secondary | ICD-10-CM

## 2024-04-30 DIAGNOSIS — M6281 Muscle weakness (generalized): Secondary | ICD-10-CM | POA: Diagnosis not present

## 2024-04-30 DIAGNOSIS — R41841 Cognitive communication deficit: Secondary | ICD-10-CM

## 2024-04-30 DIAGNOSIS — R269 Unspecified abnormalities of gait and mobility: Secondary | ICD-10-CM

## 2024-04-30 DIAGNOSIS — R262 Difficulty in walking, not elsewhere classified: Secondary | ICD-10-CM

## 2024-04-30 NOTE — Therapy (Addendum)
 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) 09/24/2021   Cerebral embolism with cerebral infarction 09/23/2021   Pyelonephritis 09/19/2021   Pain due to onychomycosis of toenails of both feet 11/19/2020   Normocytic anemia 05/31/2020   Acute ischemic left MCA stroke (HCC) 04/30/2020   Right hemiplegia (HCC) 04/30/2020   Cerebrovascular accident (HCC) 04/21/2020   Atrial fibrillation with RVR (HCC) 04/21/2020   Essential hypertension 04/21/2020   Alcohol abuse 04/21/2020   ONSET DATE: 09/19/21  REFERRING DIAG: CVA  THERAPY DIAG:  Muscle weakness (generalized)  Other lack of  coordination  Rationale for Evaluation and Treatment: Rehabilitation  SUBJECTIVE:  SUBJECTIVE STATEMENT:  Pt./Caregiver Reports that she may be traveling to [place] soon.    Pt Pt accompanied by: self, daughter  PERTINENT HISTORY: Pt. is a 68 y.o. 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 Pt. has Right sided weakness, and receptive, and expressive aphasia. Pt. has had a previous CVA with right sided weakness in July 2021. PMHx includes: AFib, Renal Artery thrombosis, situational depression, HTN, Pyelnephritis, Seizure activity, COnstipation, AKI. Vitamin D  deficiency, and urinary incontinence   PRECAUTIONS: None  WEIGHT BEARING RESTRICTIONS: No  PAIN:  Are you having pain?  04/30/2024: 2-4/10 using the Wong-Baker Facial Grimace Scale, at the R forearm during PROM in horizontal abduction.SABRA 0-2/10 Wong-Baker Facial Grimace Scale  FALLS: Has patient fallen in last 6 months? Yes. Number of falls 1-ER visit  LIVING ENVIRONMENT: Pt. resides at [facility] Pt. has very supportive family Stairs: One level Has following equipment at home:  Equipment provided through [employer]  PLOF: Needs assistance with ADLs  PATIENT GOALS:  To improve ROM/flexibility in the RUE 2/2 increased tightness, and to improve ADL functioning in order to towards returning home.  OBJECTIVE:   Note: Objective measures were completed at Evaluation unless otherwise noted.  HAND DOMINANCE: Right  ADLs:  Overall ADLs: Assist is provided from the staff at [facility]. Transfers/ambulation related to ADLs: Eating: independent, with set-up using the left hand Grooming: Independent with set-up using the left hand UB Dressing: Increased assist required, Mod-MaxA doffing jacket LB Dressing: Increased assist required Toileting:  Staff Assists with toileting transfers, and toilet hygiene care. Bathing: Staff assists with  bathing Tub Shower transfers: Requires staff assist Equipment: TBD  IADLs: Shopping: N/A Light housekeeping: Provided by staff Meal Prep: 3 Meals a day provided. Pt. Eats in the main dining room when family visits, otherwise Pt. Eats in her room.  Community mobility: Relies on family for transportation to therapy. Per daughter- Transportation is provided for services not offered at the facility. Medication management: Provided by the facility Financial management: Daughter manages Handwriting: TBD  MOBILITY STATUS: Hx of falls  FUNCTIONAL OUTCOME MEASURES:  UPPER EXTREMITY ROM:    Passive ROM Left Eval AROM WFL Right Eval  Right 01/02/24 Right 02/08/24  Shoulder flexion  0(46) 25 (90) 0(94)  Shoulder abduction  0(64) 0 (90) 0(78)  Shoulder adduction      Shoulder extension      Shoulder internal rotation      Shoulder external rotation   (25) 0(25)  Elbow flexion  0(98) 0(131) 0(133)  Elbow extension  0(full passive) 0 (full passive) 0(full passive)  Wrist flexion  0(56) 0(90) 0(74)  Wrist extension  -56(30) 0 (40) 6(42)  Wrist ulnar deviation      Wrist radial deviation      Wrist pronation      Wrist supination      (Blank rows = not tested)  UPPER EXTREMITY MMT:     MMT Left Eval 5/5 Right eval Right  01/02/24 Right 02/08/24  Shoulder flexion  0/5 2/5  0/5  Shoulder abduction  0/5 1/5 0/5  Shoulder adduction   2-/5 1/5  Shoulder extension      Shoulder internal rotation   1/5 0/5  Shoulder external rotation      Middle trapezius      Lower trapezius      Elbow flexion  0/5 0/5 0/5  Elbow extension  0/5 2/5 1/5  Wrist flexion   1/5   Wrist extension  0/5 3-/5 1/5  Wrist ulnar deviation      Wrist radial deviation      Wrist pronation  0/5 0/5 1/5  Wrist supination  0/5 0/5 0/5  Digit flexors   2-/5 2-/5  Digit extensors   0/5 0/5  (Blank rows = not tested)  HAND FUNCTION: N/A  COORDINATION: N/A  SENSATION: Diminished  EDEMA: No edema noted in  the right hand today  MUSCLE TONE: impaired  COGNITION: Overall cognitive status: Aphasia  VISION: Wears glasses, no change from baseline  PERCEPTION: TBD  PRAXIS: Impaired: Motor planning  TREATMENT DATE: 04/30/24    Manual therapy:   -Pt. Tolerated Soft tissue massage to the R scapular, pectoral, right shoulder musculature, R Bicep, and R volar aspect of the forearm.  -Soft tissue mobilizations to promote radius on ulna motion needed for supination in conjunction with moist heat modality. -Manual therapy was performed independent of, and in preparation for therapeutic Ex.  Therapeutic Exercise:   -Pt. Tolerated PROM through the following joint ranges of the RUE following moist heat modality, including: shoulder flexion, abduction,  elbow flexion, extension, working to increase RUE mobility to manage hemiparetic limb during basic ADLs, as well as reducing contracture risk. -Facilitated thumb abduction/adduction with facilitory muscle tapping in preparation for improving ROM needed for initiating a grasp.  Self-Care:  -Pt. Simulating LB dressing task using a 24 reacher, to hike pants to the knee, then using the L hand to hike the pants to the hips while seated.  -Pt. Simulated LB dressing task while standing, with mInA during sit to stand from the w/c, and mod/maxA to weight shift in standing to move her RLE into position.  Pt. Requires maxA reaching to simulate donning pants over her feet, modA to hike them to her knees, and minA from the knees to the hips on the left side using the L hand. Pt. required maxA to donn them over the right hip. Pt. required Hemi-Walker to assist with transfer.    PATIENT EDUCATION: Education details: RUE PROM/AAROM, Pt./caregiver education about possible restorative ROM programs at SNF Person educated: Patient Education method:  Explanation, Demonstration, Tactile cues, and Verbal cues Education comprehension: verbal cues required, tactile cues required, and needs further education  HOME EXERCISE PROGRAM: RUE self PROM  GOALS: Goals reviewed with patient? Yes  SHORT TERM GOALS: Target date: 01/16/2024    Pt. Will demonstrate supervision with HEPs for BUEs Baseline: Eval: No current HEPs; 01/02/24: Pt demos self PROM techniques throughout the RUE with intermittent min A and mod vc for accuracy 02/08/24: Pt. requires cues for initiation, and hand over hand assist. 02/20/24:  Goal status: in progress  LONG TERM GOALS: Target date: 05/02/2024    1.  Pt. Will improve right shoulder flexion to 45* to assist with UE dressing Baseline: Eval: Right: 0(46); 01/02/24: Right: 25 (90) 02/08/24: 0(94) 02/20/24: Limited. Assist required for UE dressing Goal status:  Ongoing  2.  Pt. Will improve with shoulder abduction by 20 degrees to assist with underarm care, and hygiene Baseline: Eval: Right 0(64); 01/02/24: Right 0 (90) 02/08/24: 0(78) 02/20/24: Limited. Assist required for underarm hygiene  Goal status: Ongoing  3.  Pt. Will improve right elbow flexion by 10 degrees to assist with hand to face patterns for grooming Baseline: Eval: Right: 0(98); 01/02/24: Right: 0(131) 02/08/24: 9(866) 02/20/24: limited 2/2 tightness Goal status: Ongoing  4.  Pt. Will elicit active wrist extension, and digit extension in preparation for rubbing lotion into the left forearm, and hand  Baseline: Eval: Right wrist: -56 (30); 01/02/24: R wrist: ext: actively to neutral/0 (passively to 40*); R digit extension: no active ext (passively [facility] for this task); unable to self apply lotion to L forearm 02/08/24: right wrist extension: 6(42) 02/20/24: Continue Goal status: I Ongoing  5.  Pt. Will perform UE dressing skills with MinA Baseline: Eval: Mod-MaxA doffing a jacket; 01/02/24: Mod A to don/doff a jacket 02/08/24: ModA to thread the right sleeve, minA to bring  the shirt around the back of the neck, and donn the left sleeve. ModA to align seams.  02/20/24: ModA to thread the right sleeve, minA to bring the shirt around the back of the neck, and donn the left sleeve. ModA to align seams.  Goal status: Ongoing  6. Pt. Will require minA toileting transfers/ clothing management Baseline: Eval: Pt. requires increased assist from staff; 01/02/24: pt can transfer to standard height toilet with grab bar for support with min A, but requires mod A to transfer up from toilet; Pt consistently performs wc<>BSC transfer with min A, extra time, and vc for sequencing while using hemi walker; total A for clothing management. 02/08/24: MinA, Total assist clothing management 02/20/24: MinA, Total assist clothing management   Goal status: in progress  ASSESSMENT:  CLINICAL IMPRESSION:  Pt. Tolerated treatment session well today. Pt. continues to present with increased tone, and tightness in right scapular musculature, right shoulder flexion, abduction, elbow flexion, wrist, and digit extension with MP flexion tightness limiting full MP extension. While performing PROM, Pt. reports 2-4/10 pain on the volar aspect of the forearm while performing PROM in horizontal abduction. Pt. Continues to present with limited ROM due to this pain and tightness during PROM shoulder abduction, shoulder horizontal abduction and elbow flexion. Pt. Requires MaxA during sit to stand transfer, as well as requiring use of hemi walker to provide support during transfer. Pt. was able to demonstrate simulation of hiking pants in standing and seated, with use of reacher to manipulate theraband around ankles,using L hand to bring theraband to the knees, and then to the hips. Pt. continues to benefit from OT services to normalize tone in the dominant RUE, improve ROM for underarm hygiene care and UE dressing, as well as to improve toileting care, and clothing negotiation tasks.    PERFORMANCE DEFICITS: in  functional skills including ADLs, IADLs, coordination, dexterity, proprioception, sensation, ROM, strength, pain, Fine motor control, Gross motor control, balance, body mechanics, decreased knowledge of use of DME, and UE functional use, cognitive skills including problem solving, and psychosocial skills including coping strategies, environmental adaptation, and routines and behaviors.   IMPAIRMENTS: are limiting patient from ADLs, IADLs, and leisure.   CO-MORBIDITIES: may have co-morbidities  that affects occupational performance. Patient will benefit from skilled OT to address above impairments and improve overall function.  MODIFICATION OR ASSISTANCE TO COMPLETE EVALUATION: Min-Moderate modification of tasks or assist with assess necessary to complete an evaluation.  OT OCCUPATIONAL PROFILE AND HISTORY: Detailed assessment: Review of records and additional review of physical, cognitive, psychosocial history related to current functional performance.  CLINICAL DECISION MAKING: Moderate - several treatment options, min-mod task modification necessary  REHAB POTENTIAL: Good  EVALUATION COMPLEXITY: Moderate    PLAN:  OT FREQUENCY: 1x/week  OT DURATION: 12 weeks  PLANNED INTERVENTIONS: 97168 OT Re-evaluation, 97535 self care/ADL training, 02889 therapeutic exercise, 97530 therapeutic activity, 97112 neuromuscular re-education, 97140 manual therapy, 97018 paraffin, 02989 moist heat, 97010 cryotherapy, 97034 contrast bath, passive range of motion, coping strategies training, patient/family education, and DME and/or AE instructions  RECOMMENDED OTHER SERVICES: PT, ST  CONSULTED AND AGREED WITH PLAN OF CARE: Patient  PLAN FOR NEXT SESSION: see above  Damien Nap, OTS   This entire session was performed under direct supervision and direction of a licensed therapist/therapist assistant . I have personally read, edited and approve of the note as written.   Richardson Otter, MS, OTR/L    04/30/2024, 2:58 PM

## 2024-04-30 NOTE — Therapy (Unsigned)
 OUTPATIENT PHYSICAL THERAPY TREATMENT   Patient Name: Lindsey [name] MRN: [id number] DOB:[birth year], 68 y.o., female Today's Date: 05/01/2024  PCP: Lorel Maxie LABOR, MD  REFERRING PROVIDER: Lorilee Sven SQUIBB, MD   END OF SESSION:   PT End of Session - 04/30/24 1104     Visit Number 35    Number of Visits 55    Date for PT Re-Evaluation 07/02/24    Authorization Type Medicare/Medicaid    Progress Note Due on Visit 40    PT Start Time 0847    PT Stop Time 0928    PT Time Calculation (min) 41 min    Equipment Utilized During Treatment Gait belt    Activity Tolerance Patient tolerated treatment well;No increased pain;Patient limited by fatigue    Behavior During Therapy [facility] for tasks assessed/performed            Past Medical History:  Diagnosis Date   Aphasia    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 No pertinent surgical history. 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   REFERRING DIAG:  Diagnosis  I69.398,R25.2 (ICD-10-CM) - Spasticity as late effect of cerebrovascular accident (CVA)    THERAPY DIAG:  Muscle weakness (generalized)  Unsteadiness on feet  Other lack of coordination  Difficulty in walking, not elsewhere classified  Abnormality of gait and mobility  Other abnormalities of gait and mobility  Apraxia  Cognitive communication deficit  Rationale for Evaluation and Treatment: Rehabilitation  SUBJECTIVE:  SUBJECTIVE STATEMENT: Daughter reports she has a goal that the patient would be strong enough in her Right LE to lift her leg up/over thresholds including front doors so she can travel better and be able to enter family members home.    Pt accompanied by: self   PERTINENT HISTORY:  Daughter present for Evaluation to provide hx. Pt is familiar to this clinic and has been seen for multiple bouts PT since initial CVA in 2021. Pt d/c'ed from PT serviced in April of 2024 with plans to receive PT services through [facility]. Daughter reports that she was only receiving minimal therapy in facility. Reports at least 1 fall in the last 6 months and daughter asked staff to transfer pt with lift since fall.  Daughter would want pt to demonstrate improve safety with transfers to allow bil transfers with reduced use of rail to allow pt to return home for assisted living.    PAIN:  Are you having pain? No  PRECAUTIONS: Fall  RED FLAGS: None   WEIGHT BEARING RESTRICTIONS: No  FALLS: Has patient fallen in last 6 months? Yes. Number of falls 1  LIVING ENVIRONMENT: Lives with: lives with their family and lives in a skilled nursing facility Lives  in: Other SNF  Stairs: No Has following equipment at home: Hemi walker and Wheelchair (manual)  PLOF: Requires assistive device for independence, Needs assistance with ADLs, Needs assistance with homemaking, and WC level currently   PATIENT GOALS: move better - walking or transferring with hemi walking   OBJECTIVE:  Note: Objective measures were completed at Evaluation unless otherwise noted.  DIAGNOSTIC FINDINGS:  Head CT:  IMPRESSION: 1. No acute intracranial abnormality. 2. No acute displaced fracture or traumatic listhesis of the cervical spine.  Cervical CT:  MPRESSION: 1. No acute intracranial abnormality. 2. No acute displaced fracture or traumatic listhesis of the cervical spine.  COGNITION: Overall cognitive status: History of cognitive impairments - at baseline   SENSATION: Light touch: Impaired  Proprioception: Impaired  Able to detect depe pressure   COORDINATION: Increased tone on the RLE   EDEMA:  Mild RLE distal edema   MUSCLE TONE: RLE: Mild and Moderate  MUSCLE LENGTH: Hamstrings: grossly limited   POSTURE: rounded shoulders, forward head, and posterior bias   LOWER EXTREMITY ROM:     Active  Right Eval Left Eval  Hip flexion  Limited to 5 deg beyond 90 in sitting  Knee flexion  90deg in sitting  Knee extension  Lacking 10 deg full extension  Ankle dorsiflexion  none  Ankle plantarflexion  none   (Blank rows = not tested)  LOWER EXTREMITY MMT:    MMT Right Eval Left Eval  Hip flexion  2+  Hip extension    Hip abduction  2  Hip adduction  3  Hip internal rotation    Hip external rotation    Knee flexion  2+  Knee extension  3+  Ankle dorsiflexion  0  Ankle plantarflexion  0  Ankle inversion    Ankle eversion    (Blank rows = not tested)  BED MOBILITY:  Sit to supine Mod A Supine to sit Mod A Rolling to Right Min A Rolling to Left Min A  TRANSFERS: Assistive device utilized: Hemi walker  Sit to stand: Mod A Stand to  sit: Mod A Chair to chair: Mod A Floor: unable to perform   CURB:  Level of Assistance: Total A Assistive device utilized: rail Curb Comments: unable to perform  GAIT: Gait pattern: non-functional constant posterior bias , step to pattern, decreased stance time- Right, Right hip hike, lateral hip instability, and lateral lean- Left Distance walked: 51ft Assistive device utilized: Hemi walker Level of assistance: Mod A Comments: moderate cues for sequencing of HW and gait pattern in turns   FUNCTIONAL TESTS:  5 times sit to stand: 48 sec with mod Assist from PT  Timed up and go (TUG): unable to perform  10 meter walk test: 6 ft with min-mod assist and HW.  FIST:                                                                                                                                TREATMENT DATE: 04/30/2024      THEREX:  -Seated knee ext 2 x 10 reps (partial ROM on right)  -Seated Hip march 2 x 10 reps (AA on RLE)   -seated ham curl with AAROM On RLE x 10 -Seatd ham curl with PT lifting RLE up so foot could clear the floor- then AROM 2 x 10   TA:   Sit to stand with LUE pushing up from armrest- multiple times during session- Continued cues to look ahead and for RLE and LUE placement- Patient with most difficulty achieving adequate knee flex enough to place her RLE closer to chair for transfer set up in order to have the advantage to place some weight through RLE  Standing at Support bar without UE support x 1 min then another 2 min trial- constant cues to shift hips anteriorly and look ahead  Side stepping along support bar- Left to right then back x 6.   Fwd/backward step up/over 1/2 foam roll  (attempted yet max difficulty clearing R foot over foam x 12+ with only last 2 trials able to clear) with a seated rest break after 8 reps.         PATIENT EDUCATION: Education details: .  Pt educated throughout session about proper posture and technique with exercises.  Improved exercise technique, movement at target joints, use of target muscles after min to mod verbal, visual, tactile cues  Person educated: Patient Education method: Explanation Education comprehension: verbalized understanding  HOME EXERCISE PROGRAM: Access Code: [email or web address] URL: [email or web address] Date: 12/05/2023 Prepared by: Massie Dollar  Exercises - Sit to Stand with Counter Support  - 1 x daily - 3-4 x weekly - 2 sets - 10 reps - Seated Long Arc Quad  - 1 x daily - 3-4 x weekly - 2 sets - 10 reps - 2 hold - Seated March  - 1 x daily - 3-4 x weekly - 2 sets - 10 reps - 2 hold - Standing Hip Abduction with Counter Support  - 1 x daily - 7 x weekly - 3 sets - 5 reps - Wide Stance with Counter Support  - 1 x daily - 7 x weekly - 3 sets - 2 reps - 30 hold -  Seated Hip Abduction  - 1 x daily - 7 x weekly - 3 sets - 10 reps - Supine Bridge  - 1 x daily - 7 x weekly - 3 sets - 10 reps - Supine Short Arc Quad  - 1 x daily - 7 x weekly - 3 sets - 10 reps - Bent Knee Fallouts  - 1 x daily - 7 x weekly - 3 sets - 10 reps - Small Range Straight Leg Raise  - 1 x daily - 7 x weekly - 3 sets - 10 reps - Supine Heel Slide  - 1 x daily - 7 x weekly - 3 sets - 10 reps  GOALS: Goals reviewed with patient? Yes  SHORT TERM GOALS: Target date: 05/10/2024  Patient will be independent in home exercise program to improve strength/mobility for better functional independence with ADLs. Baseline: to be provided on visit 2: 12/28/2023- No caregiver with patient to determine if compliant with HEP Goal  5/13: daugher states that they are completing some exercises almost daily; 04/09/2024= No caregiver present today to f/u with compliance of HEP- Patient verbalized yes and stated standing, moving my legs.  status: IN PROGRESS   LONG TERM GOALS: Target date: 07/02/2024  2.  Patient (> 60 years old) will complete five times sit to stand test by 15 seconds and only min assist indicating an  increased LE strength and improved balance. Baseline: 48 with mod assist from PT; 12/28/2023= 44 sec from EOM at lowest postion (approx 21 in) with Left UE support and CGA to min A from PT 5/13: 28.80 sec with HW from [facility]; min assist from PT for full erect standing; 04/09/2024= 26.56 sec with HW in front- pushing up using LUE on armrest of w/c- Min Assist for full erect standing Goal status: Progressing  3.  Patient will increase FIST score by > 6 points to demonstrate decreased fall risk during functional activities Baseline: 37 5/13: 46/56 Goal status: MET  4.  Patient will complete 10 meter walk test to with HW in less than 1 minute with min assist as to improve gait speed for better community ambulation and to reduce fall risk. Baseline: 6 ft with HW and mod assist; 12/28/2023=18 feet with HW- mod assist  5/13: 2:11 min with HW, min-mod assist. 04/09/2024= Patient ambulated approx 22 feet using HW with Min assist and mod VC for gait sequencing-then experienced LOB requiring Mod Assist to maintain standing. Goal status: PROGRESSING  5.  Patient will perform stand pivot transfer to and from [facility] with HW and CGA for safety to allow safe transfer to toilet with daughter without use of rail on the wall. Baseline: mod assist and heavy use of rail. 12/28/2023= Min to Mod assist with repetitive VC for technique 5/13: min assist overall with HW; mod assist initially both directions  to prevent posterior LOB. 04/09/2024- Min assist physically in both directions yet increased VC for safety- if provided more time- patient able to make correct movements 75% of time during transfers- still inconsistent with reaching back and pivoting on Right LE  Goal status: Progressing  6.  Patient will improve bed mobility to min assist only to reduce care giver burden and improve safety of transfers  Baseline: mod-max assist 5/13: Min-mod assist for sit<>supine. Min assist only for RLE management for rolling; 04/09/2024- unable to  test due to time but will retest next session  Goal status: ONGOING  ASSESSMENT:  CLINICAL IMPRESSION:  Treatment focused today per dtr request to  work on stepping over thresholds. Patient lacks adequate RLE to consistently clear the obstacle today yet with persistence - was finally able to clear with considerable RLE compensation and min A. She was pleased with her ability. Reviewed exercises that she should be performing at home to help with hip/knee flex strength with patient and dtr again today. Pt will benefit from continued skilled PT to address strength and balance deficits to improve safety for transfers, reduce care giver burden, and allow return to PLOF and possible return home from Assisted living.   OBJECTIVE IMPAIRMENTS: Abnormal gait, cardiopulmonary status limiting activity, decreased activity tolerance, decreased balance, decreased cognition, decreased coordination, decreased endurance, decreased knowledge of use of DME, decreased mobility, difficulty walking, decreased ROM, decreased strength, hypomobility, increased muscle spasms, impaired flexibility, impaired sensation, impaired tone, impaired UE functional use, improper body mechanics, and postural dysfunction.   ACTIVITY LIMITATIONS: carrying, lifting, bending, standing, squatting, stairs, transfers, bed mobility, continence, bathing, toileting, dressing, reach over head, hygiene/grooming, and locomotion level  PARTICIPATION LIMITATIONS: cleaning, interpersonal relationship, shopping, and community activity  PERSONAL FACTORS: 3+ comorbidities: HTN, chronic CVA, Afib, Lupus  are also affecting patient's functional outcome.   REHAB POTENTIAL: Fair chronic CVA with poor progress in prior interventions with this clinic  CLINICAL DECISION MAKING: Unstable/unpredictable  EVALUATION COMPLEXITY: High  PLAN:  PT FREQUENCY: 1-2x/week  PT DURATION: 12 weeks  PLANNED INTERVENTIONS: 97110-Therapeutic exercises, 97530- Therapeutic  activity, [id number]- Neuromuscular re-education, 97535- Self Care, [id number]- Manual therapy, [phone]- Gait training, [phone]- Orthotic Fit/training, [phone]- Aquatic Therapy, [phone]- Splinting, [id number]- Electrical stimulation (unattended), [phone]- Electrical stimulation (manual), Patient/Family education, Balance training, Stair training, Joint mobilization, Vestibular training, Visual/preceptual remediation/compensation, Cognitive remediation, DME instructions, Wheelchair mobility training, Cryotherapy, and Moist heat   PLAN FOR NEXT SESSION:   -  R glute activation and strength during standing  Continue  standing balance.  Gait training as appropriate Transfers with decreased UE support and awareness of posterior LOB.  -Continue with practice stepping over thresholds    8:11 AM, 05/01/24 Chyrl London, PT Physical Therapist - [facility]  Outpatient Physical Therapy- Main Campus [phone]

## 2024-05-02 ENCOUNTER — Encounter: Admitting: Speech Pathology

## 2024-05-02 ENCOUNTER — Ambulatory Visit: Admitting: Occupational Therapy

## 2024-05-02 ENCOUNTER — Ambulatory Visit

## 2024-05-03 ENCOUNTER — Ambulatory Visit: Attending: Physical Medicine and Rehabilitation | Admitting: Physical Therapy

## 2024-05-03 DIAGNOSIS — R269 Unspecified abnormalities of gait and mobility: Secondary | ICD-10-CM | POA: Insufficient documentation

## 2024-05-03 DIAGNOSIS — M6281 Muscle weakness (generalized): Secondary | ICD-10-CM | POA: Insufficient documentation

## 2024-05-03 DIAGNOSIS — R41841 Cognitive communication deficit: Secondary | ICD-10-CM | POA: Diagnosis present

## 2024-05-03 DIAGNOSIS — R278 Other lack of coordination: Secondary | ICD-10-CM | POA: Insufficient documentation

## 2024-05-03 DIAGNOSIS — R482 Apraxia: Secondary | ICD-10-CM | POA: Diagnosis present

## 2024-05-03 DIAGNOSIS — R2681 Unsteadiness on feet: Secondary | ICD-10-CM | POA: Diagnosis present

## 2024-05-03 DIAGNOSIS — R262 Difficulty in walking, not elsewhere classified: Secondary | ICD-10-CM | POA: Diagnosis present

## 2024-05-03 DIAGNOSIS — R2689 Other abnormalities of gait and mobility: Secondary | ICD-10-CM | POA: Insufficient documentation

## 2024-05-03 NOTE — Therapy (Signed)
 OUTPATIENT PHYSICAL THERAPY TREATMENT   Patient Name: Lindsey [name] MRN: [id number] DOB:06/12/1956, 68 y.o., female Today's Date: 05/03/2024  PCP: Lorel Maxie LABOR, MD  REFERRING PROVIDER: Lorilee Sven SQUIBB, MD   END OF SESSION:   PT End of Session - 05/03/24 0804     Visit Number 36    Number of Visits 55    Date for PT Re-Evaluation 07/02/24    Authorization Type Medicare/Medicaid    Progress Note Due on Visit 40    PT Start Time 0804    PT Stop Time 0846    PT Time Calculation (min) 42 min    Equipment Utilized During Treatment Gait belt    Activity Tolerance Patient tolerated treatment well;No increased pain;Patient limited by fatigue    Behavior During Therapy [facility] for tasks assessed/performed             Past Medical History:  Diagnosis Date   Aphasia    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   REFERRING DIAG:  Diagnosis  I69.398,R25.2 (ICD-10-CM) - Spasticity as late effect of cerebrovascular accident (CVA)    THERAPY DIAG:  Muscle weakness (generalized)  Other lack of coordination  Unsteadiness on feet  Difficulty in walking, not elsewhere classified  Abnormality of gait and mobility  Other abnormalities of gait and mobility  Apraxia  Rationale for Evaluation and Treatment: Rehabilitation  SUBJECTIVE:  SUBJECTIVE STATEMENT:  Pt reports she is doing good. Denies pain. (Pt primarily states yes or no in response to therapist's questions due to aphasia)  Pt accompanied by: self   PERTINENT HISTORY:  Daughter present for Evaluation to provide hx. Pt is familiar to this clinic and has been seen for multiple bouts PT since initial CVA in 2021. Pt d/c'ed from PT serviced in April of 2024 with plans to receive PT services through [facility]. Daughter reports that she was only receiving minimal therapy in facility. Reports at least 1 fall in the last 6 months and daughter asked staff to transfer pt with lift since fall.  Daughter would want pt to demonstrate improve safety with transfers to allow bil transfers with reduced use of rail to allow pt to return home for assisted living.    PAIN:  Are you having pain? No  PRECAUTIONS: Fall  RED FLAGS: None   WEIGHT BEARING RESTRICTIONS: No  FALLS: Has patient fallen in last 6 months? Yes. Number of falls 1  LIVING ENVIRONMENT: Lives with: lives with their family and lives in a skilled nursing facility Lives in: Other SNF  Stairs: No Has following equipment at home: Hemi walker and Wheelchair (manual)  PLOF: Requires assistive device  for independence, Needs assistance with ADLs, Needs assistance with homemaking, and WC level currently   PATIENT GOALS: move better - walking or transferring with hemi walking   OBJECTIVE:  Note: Objective measures were completed at Evaluation unless otherwise noted.  DIAGNOSTIC FINDINGS:  Head CT:  IMPRESSION: 1. No acute intracranial abnormality. 2. No acute displaced fracture or traumatic listhesis of the cervical spine.  Cervical CT:  MPRESSION: 1. No acute intracranial abnormality. 2. No acute displaced fracture or traumatic listhesis of the cervical spine.  COGNITION: Overall cognitive status: History of cognitive impairments - at baseline   SENSATION: Light touch: Impaired  Proprioception: Impaired  Able to detect depe pressure   COORDINATION: Increased tone on the RLE   EDEMA:  Mild RLE distal edema   MUSCLE TONE: RLE: Mild and Moderate  MUSCLE LENGTH: Hamstrings: grossly limited   POSTURE: rounded shoulders, forward head, and posterior bias   LOWER EXTREMITY ROM:     Active  Right Eval Left Eval  Hip flexion  Limited to 5 deg beyond 90 in sitting  Knee flexion  90deg in sitting  Knee extension  Lacking 10 deg full extension  Ankle dorsiflexion  none  Ankle plantarflexion  none   (Blank rows = not tested)  LOWER EXTREMITY MMT:    MMT Right Eval Left Eval  Hip flexion  2+  Hip extension    Hip abduction  2  Hip adduction  3  Hip internal rotation    Hip external rotation    Knee flexion  2+  Knee extension  3+  Ankle dorsiflexion  0  Ankle plantarflexion  0  Ankle inversion    Ankle eversion    (Blank rows = not tested)  BED MOBILITY:  Sit to supine Mod A Supine to sit Mod A Rolling to Right Min A Rolling to Left Min A  TRANSFERS: Assistive device utilized: Hemi walker  Sit to stand: Mod A Stand to sit: Mod A Chair to chair: Mod A Floor: unable to perform   CURB:  Level of Assistance: Total A Assistive device utilized:  rail Curb Comments: unable to perform   GAIT: Gait pattern: non-functional constant posterior bias , step to pattern, decreased stance time- Right, Right hip  hike, lateral hip instability, and lateral lean- Left Distance walked: 58ft Assistive device utilized: Hemi walker Level of assistance: Mod A Comments: moderate cues for sequencing of HW and gait pattern in turns   FUNCTIONAL TESTS:  5 times sit to stand: 48 sec with mod Assist from PT  Timed up and go (TUG): unable to perform  10 meter walk test: 6 ft with min-mod assist and HW.  FIST:                                                                                                                                TREATMENT DATE: 05/03/2024  Pt arrived in her personal manual wheelchair.  L stand pivot w/c>EOM using hemi-walker with skilled min A for balance due to posterior lean/LOB - pt with impaired motor planning of where to step R LE during the transfer, frequently placing it too far anterior causing increased posterior LOB.   Repeated sit<>stands from EOM to L UE support on HW x10reps with skilled light min A for initial lift and final lower - pt responds best to cuing to bring chest to her knees in order to flex at her hips to improve rising to stand and eccentric control when lowering. Continues to lack R knee flexion due to increased tone (although improving), requiring therapist to manually facilitate placement prior to initiating transfer. Continues to require cuing to scoot hips towards edge prior to initiating transfer too.  R stand pivot EOM>w/c using L HW - benefits form cuing to step R foot towards w/c wheel to improve R lateral and posterior step rather than stepping R LE too far anterior - requires skilled min A for balance due to posterior lean bias and for facilitating weight shifting onto stance limb.  Gait training ~81ft using L HW with skilled min assist for balance and facilitation with +2 w/c follow to allow  increased distance (could have ambulated further; however, thought pt reported need to use restroom but misunderstood due to aphasia). Pt demonstrating the following gait deviations with therapist providing the described cuing and facilitation for improvement:  Downward gaze Significantly decreased gait speed with increased time in double limb support Lacks full L wt shift onto L stance to allow improved R swing advancement Due to R LE extensor tone, lacks hip/knee flexion during swing, compensating with circumduction with poor foot clearance  Able to achieve min reciprocal pattern  L stand pivot w/c>EOM using HW with therapist providing more room for patient to manage HW and pt having significant difficulty stepping R LE posteriorly back to seat, requiring min manual facilitation to step R LE further; otherwise this continues to cause posterior LOB due to R foot being too far in front of her.  Dynamic gait training including:  Forward/backwards ~66ft each direction using HW (near balance bar on pt's L side for safety, but not used) Determined patient does best with backwards gait when she leaves HW slightly in front  of her and then steps back in following sequence: R LE, L LE, then brings HW back to her; because otherwise, with her extensor tone if she moves HW behind her before stepping then it can lead to anterior LOB Requires skilled min assist and manual facilitation to improve R LE posterior stepping Performed 2laps x2 (seated break between) Pt reports this as tiring  Pt in her personal wheelchair at end of session.    PATIENT EDUCATION: Education details: .  Pt educated throughout session about proper posture and technique with exercises. Improved exercise technique, movement at target joints, use of target muscles after min to mod verbal, visual, tactile cues  Person educated: Patient Education method: Explanation Education comprehension: verbalized understanding  HOME EXERCISE  PROGRAM: Access Code: [email or web address] URL: [email or web address] Date: 12/05/2023 Prepared by: Massie Dollar  Exercises - Sit to Stand with Counter Support  - 1 x daily - 3-4 x weekly - 2 sets - 10 reps - Seated Long Arc Quad  - 1 x daily - 3-4 x weekly - 2 sets - 10 reps - 2 hold - Seated March  - 1 x daily - 3-4 x weekly - 2 sets - 10 reps - 2 hold - Standing Hip Abduction with Counter Support  - 1 x daily - 7 x weekly - 3 sets - 5 reps - Wide Stance with Counter Support  - 1 x daily - 7 x weekly - 3 sets - 2 reps - 30 hold - Seated Hip Abduction  - 1 x daily - 7 x weekly - 3 sets - 10 reps - Supine Bridge  - 1 x daily - 7 x weekly - 3 sets - 10 reps - Supine Short Arc Quad  - 1 x daily - 7 x weekly - 3 sets - 10 reps - Bent Knee Fallouts  - 1 x daily - 7 x weekly - 3 sets - 10 reps - Small Range Straight Leg Raise  - 1 x daily - 7 x weekly - 3 sets - 10 reps - Supine Heel Slide  - 1 x daily - 7 x weekly - 3 sets - 10 reps  GOALS: Goals reviewed with patient? Yes  SHORT TERM GOALS: Target date: 05/10/2024  Patient will be independent in home exercise program to improve strength/mobility for better functional independence with ADLs. Baseline: to be provided on visit 2: 12/28/2023- No caregiver with patient to determine if compliant with HEP Goal  5/13: daugher states that they are completing some exercises almost daily; 04/09/2024= No caregiver present today to f/u with compliance of HEP- Patient verbalized yes and stated standing, moving my legs.  status: IN PROGRESS   LONG TERM GOALS: Target date: 07/02/2024  2.  Patient (> 52 years old) will complete five times sit to stand test by 15 seconds and only min assist indicating an increased LE strength and improved balance. Baseline: 48 with mod assist from PT; 12/28/2023= 44 sec from EOM at lowest postion (approx 21 in) with Left UE support and CGA to min A from PT 5/13: 28.80 sec with HW from [facility]; min assist from PT for full erect  standing; 04/09/2024= 26.56 sec with HW in front- pushing up using LUE on armrest of w/c- Min Assist for full erect standing Goal status: Progressing  3.  Patient will increase FIST score by > 6 points to demonstrate decreased fall risk during functional activities Baseline: 37 5/13: 46/56 Goal status: MET  4.  Patient will complete 10 meter  walk test to with [facility] in less than 1 minute with min assist as to improve gait speed for better community ambulation and to reduce fall risk. Baseline: 6 ft with HW and mod assist; 12/28/2023=18 feet with HW- mod assist  5/13: 2:11 min with HW, min-mod assist. 04/09/2024= Patient ambulated approx 22 feet using HW with Min assist and mod VC for gait sequencing-then experienced LOB requiring Mod Assist to maintain standing. Goal status: PROGRESSING  5.  Patient will perform stand pivot transfer to and from [facility] with HW and CGA for safety to allow safe transfer to toilet with daughter without use of rail on the wall. Baseline: mod assist and heavy use of rail. 12/28/2023= Min to Mod assist with repetitive VC for technique 5/13: min assist overall with HW; mod assist initially both directions  to prevent posterior LOB. 04/09/2024- Min assist physically in both directions yet increased VC for safety- if provided more time- patient able to make correct movements 75% of time during transfers- still inconsistent with reaching back and pivoting on Right LE  Goal status: Progressing  6.  Patient will improve bed mobility to min assist only to reduce care giver burden and improve safety of transfers  Baseline: mod-max assist 5/13: Min-mod assist for sit<>supine. Min assist only for RLE management for rolling; 04/09/2024- unable to test due to time but will retest next session  Goal status: ONGOING  ASSESSMENT:  CLINICAL IMPRESSION:  Therapy session focused on stand pivot transfers using HW and dynamic gait training. Patient continues to demo impaired R LE knee flexion ROM due  to significant extensor tone; however, this is improving and therapist now able to facilitate improved R LE foot placement underneath her BOS prior to initiating transfers. Patient continues to demo impaired motor planning sequencing of stand pivot transfers with use of HW, leaving R foot too far anterior causing increased posterior LOB. Performed dynamic gait training of forward/backwards gait with pt requiring manual facilitation for increased R LE step length posteriorly due to extensor tone limiting R knee flexion; however, improvement in this will increase patient's ability to more independently and more safely complete stand pivot transfers to step back to seat. Pt will benefit from continued skilled PT to address strength and balance deficits to improve safety for transfers, reduce care giver burden, and allow return to PLOF and possible return home from Assisted living.   OBJECTIVE IMPAIRMENTS: Abnormal gait, cardiopulmonary status limiting activity, decreased activity tolerance, decreased balance, decreased cognition, decreased coordination, decreased endurance, decreased knowledge of use of DME, decreased mobility, difficulty walking, decreased ROM, decreased strength, hypomobility, increased muscle spasms, impaired flexibility, impaired sensation, impaired tone, impaired UE functional use, improper body mechanics, and postural dysfunction.   ACTIVITY LIMITATIONS: carrying, lifting, bending, standing, squatting, stairs, transfers, bed mobility, continence, bathing, toileting, dressing, reach over head, hygiene/grooming, and locomotion level  PARTICIPATION LIMITATIONS: cleaning, interpersonal relationship, shopping, and community activity  PERSONAL FACTORS: 3+ comorbidities: HTN, chronic CVA, Afib, Lupus  are also affecting patient's functional outcome.   REHAB POTENTIAL: Fair chronic CVA with poor progress in prior interventions with this clinic  CLINICAL DECISION MAKING:  Unstable/unpredictable  EVALUATION COMPLEXITY: High  PLAN:  PT FREQUENCY: 1-2x/week  PT DURATION: 12 weeks  PLANNED INTERVENTIONS: 97110-Therapeutic exercises, 97530- Therapeutic activity, [id number]- Neuromuscular re-education, 97535- Self Care, [id number]- Manual therapy, [phone]- Gait training, [phone]- Orthotic Fit/training, [phone]- Aquatic Therapy, [phone]- Splinting, 97014- Electrical stimulation (unattended), [phone]- Electrical stimulation (manual), Patient/Family education, Balance training, Stair training, Joint mobilization, Vestibular training, Visual/preceptual remediation/compensation, Cognitive  remediation, DME instructions, Wheelchair mobility training, Cryotherapy, and Moist heat   PLAN FOR NEXT SESSION:  -  R glute activation and strength during standing  Continue  standing balance.  Gait training as appropriate Stand pivot transfer training with use of HW focusing on awareness of posterior LOB & correct motor planning of R LE placement -Continue with practice stepping over thresholds and stepping posteriorly    Duwan Adrian, PT, DPT, NCS, CSRS Physical Therapist - [facility]  [facility]  8:54 AM 05/03/24

## 2024-05-07 ENCOUNTER — Ambulatory Visit: Admitting: Occupational Therapy

## 2024-05-07 ENCOUNTER — Ambulatory Visit

## 2024-05-07 ENCOUNTER — Encounter: Admitting: Speech Pathology

## 2024-05-07 DIAGNOSIS — R269 Unspecified abnormalities of gait and mobility: Secondary | ICD-10-CM

## 2024-05-07 DIAGNOSIS — M6281 Muscle weakness (generalized): Secondary | ICD-10-CM

## 2024-05-07 DIAGNOSIS — R262 Difficulty in walking, not elsewhere classified: Secondary | ICD-10-CM

## 2024-05-07 DIAGNOSIS — R278 Other lack of coordination: Secondary | ICD-10-CM

## 2024-05-07 DIAGNOSIS — R2681 Unsteadiness on feet: Secondary | ICD-10-CM

## 2024-05-07 NOTE — Therapy (Incomplete)
 Occupational Therapy Progress/Discharge Note  Dates of reporting period  02/20/24   to   05/07/24    Patient Name: Lindsey [name] MRN: [id number] DOB:September 16, 1956, 68 y.o., female Today's Date: 05/08/2024  PCP: Lorel Maxie LABOR, MD REFERRING PROVIDER: Lorilee Railing, MD    OT End of Session - 05/07/24 0934     Visit Number 20    Number of Visits 48    Date for OT Re-Evaluation 05/07/24   OT Start Time 0930    OT Stop Time 1015    OT Time Calculation (min) 45 min    Activity Tolerance Patient tolerated treatment well    Behavior During Therapy WFL for tasks assessed/performed               Past Medical History:  Diagnosis Date   Aphasia    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) 09/24/2021   Cerebral embolism with cerebral infarction 09/23/2021   Pyelonephritis 09/19/2021   Pain due to onychomycosis of toenails of both feet 11/19/2020   Normocytic anemia 05/31/2020   Acute ischemic left MCA stroke (HCC) 04/30/2020   Right hemiplegia (HCC) 04/30/2020   Cerebrovascular accident (HCC) 04/21/2020   Atrial fibrillation with RVR (HCC) 04/21/2020   Essential hypertension 04/21/2020   Alcohol abuse 04/21/2020   ONSET DATE: 09/19/21  REFERRING DIAG: CVA  THERAPY  DIAG:  Muscle weakness (generalized)  Rationale for Evaluation and Treatment: Rehabilitation  SUBJECTIVE:  SUBJECTIVE STATEMENT:  Pt. Report no pain today, however reports tightness.   Addendum: Attempted to follow-up out the Pt.'s daughter/caregiver x's 2 via telephone about Pt. Discharge from OT.  However, was unable to reach, or leave a message due to voicemail being full.  Pt Pt accompanied by: self, daughter  PERTINENT HISTORY: Pt. is a 68 y.o. 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 Pt. has Right sided weakness, and receptive, and expressive aphasia. Pt. has had a previous CVA with right sided weakness in July 2021. PMHx includes: AFib, Renal Artery thrombosis, situational depression, HTN, Pyelnephritis, Seizure activity, COnstipation, AKI. Vitamin D  deficiency, and urinary incontinence   PRECAUTIONS: None  WEIGHT BEARING RESTRICTIONS: No  PAIN:  Are you having pain?  05/07/24: No pain reported... positive tightness 04/30/2024: 2-4/10 using the Wong-Baker Facial Grimace Scale, at the R forearm during PROM in horizontal abduction.SABRA 0-2/10 Wong-Baker Facial Grimace Scale  FALLS: Has patient fallen in last 6 months? Yes. Number of falls 1-ER visit  LIVING ENVIRONMENT: Pt. resides at [facility]. Pt. has very supportive family Stairs: One level Has following equipment at home:  Equipment provided through [employer]  PLOF: Needs assistance with ADLs  PATIENT GOALS:  To improve ROM/flexibility in the RUE 2/2 increased tightness, and to improve ADL functioning in order to towards returning home.  OBJECTIVE:   Note: Objective measures were completed at Evaluation unless otherwise noted.  HAND DOMINANCE: Right  ADLs: Overall ADLs: Assist is provided from the staff at [facility]. Transfers/ambulation related to ADLs: Eating: independent, with set-up using the left hand Grooming:  Independent with set-up using the left hand UB Dressing: Increased assist required, Mod-MaxA doffing jacket LB Dressing: Increased assist required Toileting:  Staff Assists with toileting transfers, and toilet hygiene care. Bathing: Staff assists with bathing Tub Shower transfers: Requires staff assist Equipment: TBD  IADLs: Shopping: N/A Light housekeeping: Provided by staff Meal Prep: 3 Meals a day provided. Pt. Eats in the main dining room when family visits, otherwise Pt. Eats in her room.  Community mobility: Relies on family for transportation to therapy. Per daughter- Transportation is provided for services not offered at the facility. Medication management: Provided by the facility Financial management: Daughter manages Handwriting: TBD  MOBILITY STATUS: Hx of falls  FUNCTIONAL OUTCOME MEASURES:  UPPER EXTREMITY ROM:    Passive ROM Left Eval AROM WFL Right Eval  Right 01/02/24 Right 02/08/24 Right 05/07/24  Shoulder flexion  0(46) 25 (90) 0(94) 0(94)  Shoulder abduction  0(64) 0 (90) 0(78) 0(78)  Shoulder adduction       Shoulder extension       Shoulder internal rotation       Shoulder external rotation   (25) 0(25) 0(25)  Elbow flexion  0(98) 0(131) 0(133) 0(133)  Elbow extension  0(full passive) 0 (full passive) 0(full passive) 0(full passive)  Wrist flexion  0(56) 0(90) 0(74) 0(74)  Wrist extension  -56(30) 0 (40) 6(42) 0-neutral(40)  Wrist ulnar deviation       Wrist radial deviation       Wrist pronation     0(full passive)  Wrist supination     0(full passive)  (Blank rows = not tested)  UPPER EXTREMITY MMT:     MMT Left Eval 5/5 Right eval Right  01/02/24 Right 02/08/24 Right 05/07/2024  Shoulder flexion  0/5 2/5  0/5 0/5  Shoulder abduction  0/5 1/5 0/5 0/5  Shoulder adduction   2-/5 1/5 0/5  Shoulder extension       Shoulder internal rotation   1/5 0/5 0/5  Shoulder external rotation       Middle trapezius       Lower trapezius       Elbow  flexion  0/5 0/5 0/5 0/5  Elbow extension  0/5 2/5 1/5 0/5  Wrist flexion   1/5  0/5  Wrist extension  0/5 3-/5 1/5 1/5  Wrist ulnar deviation       Wrist radial deviation       Wrist pronation  0/5 0/5 1/5 1/5  Wrist supination  0/5 0/5 0/5 0/5  Digit flexors   2-/5 2-/5 2-/5 reflexive  Digit extensors   0/5 0/5 0/5  (Blank rows = not tested)  HAND FUNCTION: N/A  COORDINATION: N/A  SENSATION: Diminished  EDEMA: No edema noted in the right hand today  MUSCLE TONE: impaired  COGNITION: Overall cognitive status: Aphasia  VISION: Wears glasses, no change from baseline  PERCEPTION: TBD  PRAXIS: Impaired: Motor planning  TREATMENT DATE: 05/07/24    Measurements were obtained and goals were reviewed with the Pt.   PATIENT EDUCATION: Education details: RUE PROM/AAROM, Pt./caregiver education about possible restorative ROM programs at SNF Person educated: Patient Education method: Explanation, Demonstration, Tactile cues, and Verbal cues Education comprehension: verbal cues required, tactile cues required, and needs further education  HOME EXERCISE PROGRAM: RUE self PROM through [employer]  GOALS: Goals reviewed with patient? Yes  SHORT TERM GOALS: Target date: 01/16/2024    Pt. Will demonstrate supervision with HEPs for BUEs Baseline: Eval: No current HEPs; 01/02/24: Pt demos self PROM techniques throughout the RUE with intermittent min A and mod vc for accuracy 02/08/24: Pt. requires cues for initiation, and hand over hand assist. 05/07/24: Pt. Requires cues and assist. A visual Handout was provided through [employer] for self PROM. Goal status: in progress  LONG TERM GOALS: Target date: 05/02/2024    1.  Pt. Will improve right shoulder flexion to 45* to assist with UE dressing Baseline: Eval: Right: 0(46); 01/02/24: Right: 25 (90) 02/08/24: 0(94)  02/20/24: Limited. Assist required for UE dressing 05/07/24: 0(94) Goal status: AROM: not met, PROM: Met. Reached a Plateau  2.  Pt. Will improve with shoulder abduction by 20 degrees to assist with underarm care, and hygiene Baseline: Eval: Right 0(64); 01/02/24: Right 0 (90) 02/08/24: 0(78) 02/20/24: Limited. Assist required for underarm hygiene 05/07/24: 0(78) Goal status:AROM: Not met, PROM: Not met  3.  Pt. Will improve right elbow flexion by 10 degrees to assist with hand to face patterns for grooming Baseline: Eval: Right: 0(98); 01/02/24: Right: 0(131) 02/08/24: 9(866) 02/20/24: limited 2/2 tightness 05/07/24: 0(133) Goal status:AROM: Not Met, PROM: Met Reached a plateau  4.  Pt. Will elicit active wrist extension, and digit extension in preparation for rubbing lotion into the left forearm, and hand  Baseline: Eval: Right wrist: -56 (30); 01/02/24: R wrist: ext: actively to neutral/0 (passively to 40*); R digit extension: no active ext (passively [facility] for this task); unable to self apply lotion to L forearm 02/08/24: right wrist extension: 6(42) 02/20/24: Continue 05/07/24: Neutral 0(40) Goal status: Partially met inconsistent   5.  Pt. Will perform UE dressing skills with MinA Baseline: Eval: Mod-MaxA doffing a jacket; 01/02/24: Mod A to don/doff a jacket 02/08/24: ModA to thread the right sleeve, minA to bring the shirt around the back of the neck, and donn the left sleeve. ModA to align seams. 02/20/24: ModA to thread the right sleeve, minA to bring the shirt around the back of the neck, and donn the left sleeve. ModA to align seams. 05/07/24: Min-ModA to thread the right sleeve over the right shoulder, minA with cues to bring the shirt around the back of the neck, and donn the left sleeve. Min-ModA to align seams. Requires step-by step cues for for hemi-dressing technique. Goal status: Pt. Continues to be limited with the RUE.  6. Pt. Will require minA toileting transfers/ clothing management Baseline:  Eval: Pt. requires increased assist from staff; 01/02/24: pt can transfer to standard height toilet with grab bar for support with min A, but requires mod A to transfer up from toilet; Pt consistently performs wc<>BSC transfer with min A, extra time, and vc for sequencing while using hemi walker; total A for clothing management. 02/08/24: MinA, Total assist clothing management 02/20/24: MinA, Total assist clothing management  05/07/2024: Pt. Requires MinA sit to stand, and transfer with increased time, minA to assist with hiking the left side of pants with support, Max-Total assist  to hike on the right side, and over right hip.  Goal status: Pt continues to be limited on the right side.  ASSESSMENT:  CLINICAL IMPRESSION:  Measurements were obtained, and goals were reviewed with the Pt.  Pt. Has reached a plateau with LUE AROM/PROM 2/2 increased tone and tightness.  Pt. continues to require assist, and step by step cuing for UE dressing using heli dressing techniques. Pt. Requires mInA to toilet transfers with increased time. Pt. Require mxA-Total assist for clothing management on right side over the right hip using the LUE.  Pt. Is unable to engage the RUE. Pt. Requires support, and assist while using  the LUE to assist with hiking clothing on the left side. Pt. Requires TotalA to management depends fasteners. Pt. could benefit from pullup style depends. Pt.  requires extensive assist to donn pants over feet 2/2 to limited reaching. Pt. was provided with a HEP for Passive Self-ROM to the RUE through [employer] with assist and cues being required  Pt. Has reached a plateau at this time, and is now appropriate for discharge from OT services. Attempted to follow-up out the Pt.'s daughter/caregiver via telephone about Pt. Discharge from OT.  However, was unable to reach, or leave a message due to voicemail being full.    PERFORMANCE DEFICITS: in functional skills including ADLs, IADLs, coordination, dexterity,  proprioception, sensation, ROM, strength, pain, Fine motor control, Gross motor control, balance, body mechanics, decreased knowledge of use of DME, and UE functional use, cognitive skills including problem solving, and psychosocial skills including coping strategies, environmental adaptation, and routines and behaviors.   IMPAIRMENTS: are limiting patient from ADLs, IADLs, and leisure.   CO-MORBIDITIES: may have co-morbidities  that affects occupational performance. Patient will benefit from skilled OT to address above impairments and improve overall function.  MODIFICATION OR ASSISTANCE TO COMPLETE EVALUATION: Min-Moderate modification of tasks or assist with assess necessary to complete an evaluation.  OT OCCUPATIONAL PROFILE AND HISTORY: Detailed assessment: Review of records and additional review of physical, cognitive, psychosocial history related to current functional performance.  CLINICAL DECISION MAKING: Moderate - several treatment options, min-mod task modification necessary  REHAB POTENTIAL: Good  EVALUATION COMPLEXITY: Moderate    PLAN:  OT FREQUENCY: 1x/week  OT DURATION: 12 weeks  PLANNED INTERVENTIONS: 97168 OT Re-evaluation, 97535 self care/ADL training, 02889 therapeutic exercise, 97530 therapeutic activity, 97112 neuromuscular re-education, 97140 manual therapy, 97018 paraffin, 02989 moist heat, 97010 cryotherapy, 97034 contrast bath, passive range of motion, coping strategies training, patient/family education, and DME and/or AE instructions  RECOMMENDED OTHER SERVICES: PT, ST  CONSULTED AND AGREED WITH PLAN OF CARE: Patient  PLAN FOR NEXT SESSION: see above  Richardson Otter, MS, OTR/L   05/08/2024, 8:18 AM

## 2024-05-07 NOTE — Therapy (Signed)
 OUTPATIENT PHYSICAL THERAPY TREATMENT   Patient Name: Emmi Wertheim MRN: [id number] DOB:1956-02-09, 68 y.o., female Today's Date: 05/07/2024  PCP: Lorel Maxie LABOR, MD  REFERRING PROVIDER: Lorilee Sven SQUIBB, MD   END OF SESSION:   PT End of Session - 05/07/24 0852     Visit Number 37    Number of Visits 55    Date for PT Re-Evaluation 07/02/24    Authorization Type Medicare/Medicaid    Progress Note Due on Visit 40    PT Start Time 0845    PT Stop Time 0925    PT Time Calculation (min) 40 min    Equipment Utilized During Treatment Gait belt    Activity Tolerance Patient tolerated treatment well;No increased pain;Patient limited by fatigue    Behavior During Therapy [facility] for tasks assessed/performed             Past Medical History:  Diagnosis Date   Aphasia    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   REFERRING DIAG:  Diagnosis  I69.398,R25.2 (ICD-10-CM) - Spasticity as late effect of cerebrovascular accident (CVA)    THERAPY DIAG:  Muscle weakness (generalized)  Other lack of coordination  Unsteadiness on feet  Difficulty in walking, not elsewhere classified  Abnormality of gait and mobility  Rationale for Evaluation and Treatment: Rehabilitation  SUBJECTIVE:  SUBJECTIVE STATEMENT: Appears well today, reports she is doing well in general. No updates form DTR.    PERTINENT HISTORY:  Daughter present for Evaluation to provide hx. Pt is familiar to this clinic and has been seen for multiple bouts PT since initial CVA in 2021. Pt d/c'ed from PT serviced in April of 2024 with plans to receive PT services through [facility]. Daughter reports that she was only receiving minimal therapy in facility. Reports at least 1 fall in the last 6 months and daughter asked staff to transfer pt with lift since fall.  Daughter would want pt to demonstrate improve safety with transfers to allow bil transfers with reduced use of rail to allow pt to return home for assisted living.    PAIN:  Are you having pain? No  PRECAUTIONS: Fall  RED FLAGS: None   WEIGHT BEARING RESTRICTIONS: No  FALLS: Has patient fallen in last 6 months? Yes. Number of falls 1  LIVING ENVIRONMENT: Lives with: lives with their family and lives in a skilled nursing facility Lives in: Other SNF  Stairs: No Has following equipment at home: Hemi walker and Wheelchair (manual)  PLOF: Requires assistive device for independence, Needs assistance with ADLs, Needs assistance with homemaking, and WC level currently   PATIENT GOALS: move better -  walking or transferring with hemi walking   OBJECTIVE:  Note: Objective measures were completed at Evaluation unless otherwise noted.  DIAGNOSTIC FINDINGS:  Head CT:  IMPRESSION: 1. No acute intracranial abnormality. 2. No acute displaced fracture or traumatic listhesis of the cervical spine.  Cervical CT:  MPRESSION: 1. No acute intracranial abnormality. 2. No acute displaced fracture or traumatic listhesis of the cervical spine.  COGNITION: Overall cognitive status: History of cognitive impairments - at baseline   SENSATION: Light touch: Impaired  Proprioception: Impaired  Able to detect depe pressure   COORDINATION: Increased tone on the RLE   EDEMA:  Mild RLE distal edema   MUSCLE TONE: RLE: Mild and Moderate  MUSCLE LENGTH: Hamstrings: grossly limited   POSTURE: rounded shoulders, forward head, and posterior bias   LOWER EXTREMITY ROM:     Active  Right Eval Left Eval  Hip flexion  Limited to 5 deg beyond 90 in sitting  Knee flexion  90deg in sitting  Knee extension  Lacking 10 deg full extension  Ankle dorsiflexion  none  Ankle plantarflexion  none   (Blank rows = not tested)  LOWER EXTREMITY MMT:    MMT Right Eval Left Eval  Hip flexion  2+  Hip extension    Hip abduction  2  Hip adduction  3  Hip internal rotation    Hip external rotation    Knee flexion  2+  Knee extension  3+  Ankle dorsiflexion  0  Ankle plantarflexion  0  Ankle inversion    Ankle eversion    (Blank rows = not tested)  BED MOBILITY:  Sit to supine Mod A Supine to sit Mod A Rolling to Right Min A Rolling to Left Min A  TRANSFERS: Assistive device utilized: Hemi walker  Sit to stand: Mod A Stand to sit: Mod A Chair to chair: Mod A Floor: unable to perform   CURB:  Level of Assistance: Total A Assistive device utilized: rail Curb Comments: unable to perform   GAIT: Gait pattern: non-functional constant posterior bias , step to pattern, decreased stance  time- Right, Right hip hike, lateral hip instability, and lateral lean- Left Distance walked: 36ft Assistive device  utilized: Hemi walker Level of assistance: Mod A Comments: moderate cues for sequencing of HW and gait pattern in turns   FUNCTIONAL TESTS:  5 times sit to stand: 48 sec with mod Assist from PT  Timed up and go (TUG): unable to perform  10 meter walk test: 6 ft with min-mod assist and HW.  FIST:                                                                                                                               TREATMENT DATE: 05/07/2024 -Squat pivot transfer WC to guest chair c mod assist  -Attempted LAQ 2x10 bilat, seated marching 2x10 bilat, knee flexion heel slides 2x10 bilat (largely not the most effective exercises due to weakness+ ROM restriction+ confusion)  -STS from chair minA c Left // bar 1x8 -STS from chair min-modA 1x5 attempts,  cues for push off chair arm, pt moves hand to bar too early each time with fall into chair following.  -STS from chair push off arm with modA, sustained up x 60sec with HW support, then alternate Left hand taps x15   -sustained standing at treatment table with intermittent LUE support:  *dual tasking activity, following commands for card flips, card taps, using visual cues, and verbal cues for number or color: moderate difficulty with executive functional tasks, but generally good trunk righting durign activity: LOB at 3:37, 3:25 -two efforts in session with a rest break between     PATIENT EDUCATION: Education details: .  Pt educated throughout session about proper posture and technique with exercises. Improved exercise technique, movement at target joints, use of target muscles after min to mod verbal, visual, tactile cues  Person educated: Patient Education method: Explanation Education comprehension: verbalized understanding  HOME EXERCISE PROGRAM: Access Code: [email or web address] URL: [email or web address] Date:  12/05/2023 Prepared by: Massie Dollar  Exercises - Sit to Stand with Counter Support  - 1 x daily - 3-4 x weekly - 2 sets - 10 reps - Seated Long Arc Quad  - 1 x daily - 3-4 x weekly - 2 sets - 10 reps - 2 hold - Seated March  - 1 x daily - 3-4 x weekly - 2 sets - 10 reps - 2 hold - Standing Hip Abduction with Counter Support  - 1 x daily - 7 x weekly - 3 sets - 5 reps - Wide Stance with Counter Support  - 1 x daily - 7 x weekly - 3 sets - 2 reps - 30 hold - Seated Hip Abduction  - 1 x daily - 7 x weekly - 3 sets - 10 reps - Supine Bridge  - 1 x daily - 7 x weekly - 3 sets - 10 reps - Supine Short Arc Quad  - 1 x daily - 7 x weekly - 3 sets - 10 reps - Bent Knee Fallouts  - 1 x daily - 7 x weekly - 3 sets - 10 reps -  Small Range Straight Leg Raise  - 1 x daily - 7 x weekly - 3 sets - 10 reps - Supine Heel Slide  - 1 x daily - 7 x weekly - 3 sets - 10 reps  GOALS: Goals reviewed with patient? Yes  SHORT TERM GOALS: Target date: 05/10/2024  Patient will be independent in home exercise program to improve strength/mobility for better functional independence with ADLs. Baseline: to be provided on visit 2: 12/28/2023- No caregiver with patient to determine if compliant with HEP Goal  5/13: daugher states that they are completing some exercises almost daily; 04/09/2024= No caregiver present today to f/u with compliance of HEP- Patient verbalized yes and stated standing, moving my legs.  status: IN PROGRESS  LONG TERM GOALS: Target date: 07/02/2024  2.  Patient (> 52 years old) will complete five times sit to stand test by 15 seconds and only min assist indicating an increased LE strength and improved balance. Baseline: 48 with mod assist from PT; 12/28/2023= 44 sec from EOM at lowest postion (approx 21 in) with Left UE support and CGA to min A from PT 5/13: 28.80 sec with HW from [facility]; min assist from PT for full erect standing; 04/09/2024= 26.56 sec with HW in front- pushing up using LUE on armrest of  w/c- Min Assist for full erect standing Goal status: Not Met   3.  Patient will increase FIST score by > 6 points to demonstrate decreased fall risk during functional activities Baseline: 37 5/13: 46/56 Goal status: MET  4.  Patient will complete 10 meter walk test to with HW in less than 1 minute with min assist as to improve gait speed for better community ambulation and to reduce fall risk. Baseline: 6 ft with HW and mod assist; 12/28/2023=18 feet with HW- mod assist  5/13: 2:11 min with HW, min-mod assist. 04/09/2024= Patient ambulated approx 22 feet using HW with Min assist and mod VC for gait sequencing-then experienced LOB requiring Mod Assist to maintain standing. Goal status: PROGRESSING  5.  Patient will perform stand pivot transfer to and from [facility] with HW and CGA for safety to allow safe transfer to toilet with daughter without use of rail on the wall. Baseline: mod assist and heavy use of rail. 12/28/2023= Min to Mod assist with repetitive VC for technique 5/13: min assist overall with HW; mod assist initially both directions  to prevent posterior LOB. 04/09/2024- Min assist physically in both directions yet increased VC for safety- if provided more time- patient able to make correct movements 75% of time during transfers- still inconsistent with reaching back and pivoting on Right LE  Goal status: Progressing  6.  Patient will improve bed mobility to min assist only to reduce care giver burden and improve safety of transfers  Baseline: mod-max assist 5/13: Min-mod assist for sit<>supine. Min assist only for RLE management for rolling; 04/09/2024- unable to test due to time  Goal status: ONGOING  ASSESSMENT:  CLINICAL IMPRESSION:  Continued with POC. Isolated exercises not very successful due to combination of ROM restriction, weakness, and confusion. Continued to work on a variety of transfers activity- pt continues to requires heavy facilitation and cues for safe performance, largely  unchanged over past 4 weeks. Alternative cues give for hands placement to cover a variety of needed motor patterns in ADL, however pt struggles ot attend to cues despite clear attempts to do so, unclear why the deviation. Did spend =additional time today in dual cognitive tasking, executive function  training in stance with intermitting UE support, signififcna tfatigue limited being up > ~ 4 minutes. Pt will benefit from continued skilled PT to address strength and balance deficits to improve safety for transfers, reduce care giver burden, and allow return to PLOF and possible return home from Assisted living.   OBJECTIVE IMPAIRMENTS: Abnormal gait, cardiopulmonary status limiting activity, decreased activity tolerance, decreased balance, decreased cognition, decreased coordination, decreased endurance, decreased knowledge of use of DME, decreased mobility, difficulty walking, decreased ROM, decreased strength, hypomobility, increased muscle spasms, impaired flexibility, impaired sensation, impaired tone, impaired UE functional use, improper body mechanics, and postural dysfunction.   ACTIVITY LIMITATIONS: carrying, lifting, bending, standing, squatting, stairs, transfers, bed mobility, continence, bathing, toileting, dressing, reach over head, hygiene/grooming, and locomotion level  PARTICIPATION LIMITATIONS: cleaning, interpersonal relationship, shopping, and community activity  PERSONAL FACTORS: 3+ comorbidities: HTN, chronic CVA, Afib, Lupus  are also affecting patient's functional outcome.   REHAB POTENTIAL: Fair chronic CVA with poor progress in prior interventions with this clinic  CLINICAL DECISION MAKING: Unstable/unpredictable  EVALUATION COMPLEXITY: High  PLAN:  PT FREQUENCY: 1-2x/week  PT DURATION: 12 weeks  PLANNED INTERVENTIONS: 97110-Therapeutic exercises, 97530- Therapeutic activity, [id number]- Neuromuscular re-education, 97535- Self Care, [id number]- Manual therapy, [phone]- Gait training,  [phone]- Orthotic Fit/training, [phone]- Aquatic Therapy, [phone]- Splinting, [id number]- Electrical stimulation (unattended), [phone]- Electrical stimulation (manual), Patient/Family education, Balance training, Stair training, Joint mobilization, Vestibular training, Visual/preceptual remediation/compensation, Cognitive remediation, DME instructions, Wheelchair mobility training, Cryotherapy, and Moist heat  PLAN FOR NEXT SESSION:  Start working toward DC as pt is returned to baseline, goals met.

## 2024-05-09 ENCOUNTER — Encounter: Admitting: Speech Pathology

## 2024-05-09 ENCOUNTER — Ambulatory Visit: Admitting: Occupational Therapy

## 2024-05-09 ENCOUNTER — Ambulatory Visit

## 2024-05-09 DIAGNOSIS — R2689 Other abnormalities of gait and mobility: Secondary | ICD-10-CM

## 2024-05-09 DIAGNOSIS — R41841 Cognitive communication deficit: Secondary | ICD-10-CM

## 2024-05-09 DIAGNOSIS — M6281 Muscle weakness (generalized): Secondary | ICD-10-CM

## 2024-05-09 DIAGNOSIS — R262 Difficulty in walking, not elsewhere classified: Secondary | ICD-10-CM

## 2024-05-09 DIAGNOSIS — R2681 Unsteadiness on feet: Secondary | ICD-10-CM

## 2024-05-09 DIAGNOSIS — R278 Other lack of coordination: Secondary | ICD-10-CM

## 2024-05-09 DIAGNOSIS — R482 Apraxia: Secondary | ICD-10-CM

## 2024-05-09 DIAGNOSIS — R269 Unspecified abnormalities of gait and mobility: Secondary | ICD-10-CM

## 2024-05-09 NOTE — Therapy (Signed)
 OUTPATIENT PHYSICAL THERAPY TREATMENT   Patient Name: Lindsey [name] MRN: [id number] DOB:1955-11-25, 68 y.o., female Today's Date: 05/09/2024  PCP: Lorel Maxie LABOR, MD  REFERRING PROVIDER: Lorilee Sven SQUIBB, MD   END OF SESSION:   PT End of Session - 05/09/24 0859     Visit Number 38    Number of Visits 55    Date for PT Re-Evaluation 07/02/24    Authorization Type Medicare/Medicaid    Progress Note Due on Visit 40    PT Start Time 0848    PT Stop Time 0929    PT Time Calculation (min) 41 min    Equipment Utilized During Treatment Gait belt    Activity Tolerance Patient tolerated treatment well;No increased pain;Patient limited by fatigue    Behavior During Therapy [facility] for tasks assessed/performed              Past Medical History:  Diagnosis Date   Aphasia    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 No pertinent surgical history. 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   REFERRING DIAG:  Diagnosis  I69.398,R25.2 (ICD-10-CM) - Spasticity as late effect of cerebrovascular accident (CVA)    THERAPY DIAG:  Muscle weakness (generalized)  Other lack of coordination  Unsteadiness on feet  Difficulty in walking, not elsewhere classified  Abnormality of gait and mobility  Other abnormalities of gait and mobility  Apraxia  Cognitive communication deficit  Rationale for Evaluation and Treatment: Rehabilitation  SUBJECTIVE:  SUBJECTIVE STATEMENT: Appears well today, reports she is doing well in general. No updates form DTR.    PERTINENT HISTORY:  Daughter present for Evaluation to provide hx. Pt is familiar to this clinic and has been seen for multiple bouts PT since initial CVA in 2021. Pt d/c'ed from PT serviced in April of 2024 with plans to receive PT services through [facility]. Daughter reports that she was only receiving minimal therapy in facility. Reports at least 1 fall in the last 6 months and daughter asked staff to transfer pt with lift since fall.  Daughter would want pt to demonstrate improve safety with transfers to allow bil transfers with reduced use of rail to allow pt to return home for assisted living.    PAIN:  Are you having pain? No  PRECAUTIONS: Fall  RED FLAGS: None   WEIGHT BEARING RESTRICTIONS: No  FALLS: Has patient fallen in last 6 months? Yes. Number of falls 1  LIVING ENVIRONMENT: Lives with: lives with their family and lives in a skilled nursing facility Lives in: Other SNF  Stairs: No Has following equipment at home: Hemi walker and Wheelchair (manual)  PLOF: Requires assistive device for independence, Needs  assistance with ADLs, Needs assistance with homemaking, and WC level currently   PATIENT GOALS: move better - walking or transferring with hemi walking   OBJECTIVE:  Note: Objective measures were completed at Evaluation unless otherwise noted.  DIAGNOSTIC FINDINGS:  Head CT:  IMPRESSION: 1. No acute intracranial abnormality. 2. No acute displaced fracture or traumatic listhesis of the cervical spine.  Cervical CT:  MPRESSION: 1. No acute intracranial abnormality. 2. No acute displaced fracture or traumatic listhesis of the cervical spine.  COGNITION: Overall cognitive status: History of cognitive impairments - at baseline   SENSATION: Light touch: Impaired  Proprioception: Impaired  Able to detect depe pressure   COORDINATION: Increased tone on the RLE   EDEMA:  Mild RLE distal edema   MUSCLE TONE: RLE: Mild and Moderate  MUSCLE LENGTH: Hamstrings: grossly limited   POSTURE: rounded shoulders, forward head, and posterior bias   LOWER EXTREMITY ROM:     Active  Right Eval Left Eval  Hip flexion  Limited to 5 deg beyond 90 in sitting  Knee flexion  90deg in sitting  Knee extension  Lacking 10 deg full extension  Ankle dorsiflexion  none  Ankle plantarflexion  none   (Blank rows = not tested)  LOWER EXTREMITY MMT:    MMT Right Eval Left Eval  Hip flexion  2+  Hip extension    Hip abduction  2  Hip adduction  3  Hip internal rotation    Hip external rotation    Knee flexion  2+  Knee extension  3+  Ankle dorsiflexion  0  Ankle plantarflexion  0  Ankle inversion    Ankle eversion    (Blank rows = not tested)  BED MOBILITY:  Sit to supine Mod A Supine to sit Mod A Rolling to Right Min A Rolling to Left Min A  TRANSFERS: Assistive device utilized: Hemi walker  Sit to stand: Mod A Stand to sit: Mod A Chair to chair: Mod A Floor: unable to perform   CURB:  Level of Assistance: Total A Assistive device utilized: rail Curb Comments: unable  to perform   GAIT: Gait pattern: non-functional constant posterior bias , step to pattern, decreased stance time- Right, Right hip hike, lateral hip instability, and lateral lean- Left Distance walked: 70ft Assistive device  utilized: Hemi walker Level of assistance: Mod A Comments: moderate cues for sequencing of HW and gait pattern in turns   FUNCTIONAL TESTS:  5 times sit to stand: 48 sec with mod Assist from PT  Timed up and go (TUG): unable to perform  10 meter walk test: 6 ft with min-mod assist and HW.  FIST:                                                                                                                               TREATMENT DATE: 05/09/2024   TA:  Static stand at support bar- VC for erect posture and to attempt more 50/50 weight bearing (Tendency to shift to L) x 4 min  -STS from chair minA c LUE pushing up from armrest and reaching back with less VC at support bar 2 x8 reps today (less eccentric control on 2nd set due to fatigue)  -Self Knee flex/ext to work on her extensor tone and prepare for transfers (be able to flex RLE back to foot flat) - Patient unable to lift her LE well enough to pull foot back consistently today.    NMR:  Static stand without UE Support x4 min focusing on erect standing and more anterior weight shift Static stand with dual task- arranging magnet letters in colored arranged boxes on dry erase board - approx 20 min with 2 seated rest breaks (Patient with some initial apraxia but improved with practice- very minimal cues- mostly to lean forward as she has tendency to posterior lean requiring min Assist at time (at least 4) during standing today.    -.  -STS from chair push off arm with modA, sustained up x 60sec with HW support, then alternate Left hand taps x15   -sustained standing at treatment table with intermittent LUE support:  *dual tasking activity, following commands for card flips, card taps, using visual cues, and verbal  cues for number or color: moderate difficulty with executive functional tasks, but generally good trunk righting durign activity: LOB at 3:37, 3:25 -two efforts in session with a rest break between     PATIENT EDUCATION: Education details: .  Pt educated throughout session about proper posture and technique with exercises. Improved exercise technique, movement at target joints, use of target muscles after min to mod verbal, visual, tactile cues  Person educated: Patient Education method: Explanation Education comprehension: verbalized understanding  HOME EXERCISE PROGRAM: Access Code: [email or web address] URL: [email or web address] Date: 12/05/2023 Prepared by: Massie Dollar  Exercises - Sit to Stand with Counter Support  - 1 x daily - 3-4 x weekly - 2 sets - 10 reps - Seated Long Arc Quad  - 1 x daily - 3-4 x weekly - 2 sets - 10 reps - 2 hold - Seated March  - 1 x daily - 3-4 x weekly - 2 sets - 10 reps - 2 hold - Standing Hip Abduction with Counter Support  - 1 x daily -  7 x weekly - 3 sets - 5 reps - Wide Stance with Counter Support  - 1 x daily - 7 x weekly - 3 sets - 2 reps - 30 hold - Seated Hip Abduction  - 1 x daily - 7 x weekly - 3 sets - 10 reps - Supine Bridge  - 1 x daily - 7 x weekly - 3 sets - 10 reps - Supine Short Arc Quad  - 1 x daily - 7 x weekly - 3 sets - 10 reps - Bent Knee Fallouts  - 1 x daily - 7 x weekly - 3 sets - 10 reps - Small Range Straight Leg Raise  - 1 x daily - 7 x weekly - 3 sets - 10 reps - Supine Heel Slide  - 1 x daily - 7 x weekly - 3 sets - 10 reps  GOALS: Goals reviewed with patient? Yes  SHORT TERM GOALS: Target date: 05/10/2024  Patient will be independent in home exercise program to improve strength/mobility for better functional independence with ADLs. Baseline: to be provided on visit 2: 12/28/2023- No caregiver with patient to determine if compliant with HEP Goal  5/13: daugher states that they are completing some exercises almost  daily; 04/09/2024= No caregiver present today to f/u with compliance of HEP- Patient verbalized yes and stated standing, moving my legs.  status: IN PROGRESS  LONG TERM GOALS: Target date: 07/02/2024  2.  Patient (> 27 years old) will complete five times sit to stand test by 15 seconds and only min assist indicating an increased LE strength and improved balance. Baseline: 48 with mod assist from PT; 12/28/2023= 44 sec from EOM at lowest postion (approx 21 in) with Left UE support and CGA to min A from PT 5/13: 28.80 sec with HW from [facility]; min assist from PT for full erect standing; 04/09/2024= 26.56 sec with HW in front- pushing up using LUE on armrest of w/c- Min Assist for full erect standing Goal status: Not Met   3.  Patient will increase FIST score by > 6 points to demonstrate decreased fall risk during functional activities Baseline: 37 5/13: 46/56 Goal status: MET  4.  Patient will complete 10 meter walk test to with HW in less than 1 minute with min assist as to improve gait speed for better community ambulation and to reduce fall risk. Baseline: 6 ft with HW and mod assist; 12/28/2023=18 feet with HW- mod assist  5/13: 2:11 min with HW, min-mod assist. 04/09/2024= Patient ambulated approx 22 feet using HW with Min assist and mod VC for gait sequencing-then experienced LOB requiring Mod Assist to maintain standing. Goal status: PROGRESSING  5.  Patient will perform stand pivot transfer to and from [facility] with HW and CGA for safety to allow safe transfer to toilet with daughter without use of rail on the wall. Baseline: mod assist and heavy use of rail. 12/28/2023= Min to Mod assist with repetitive VC for technique 5/13: min assist overall with HW; mod assist initially both directions  to prevent posterior LOB. 04/09/2024- Min assist physically in both directions yet increased VC for safety- if provided more time- patient able to make correct movements 75% of time during transfers- still inconsistent with  reaching back and pivoting on Right LE  Goal status: Progressing  6.  Patient will improve bed mobility to min assist only to reduce care giver burden and improve safety of transfers  Baseline: mod-max assist 5/13: Min-mod assist for sit<>supine. Min assist  only for RLE management for rolling; 04/09/2024- unable to test due to time  Goal status: ONGOING  ASSESSMENT:  CLINICAL IMPRESSION:  Patient demonstrated much improved attention to task with less VC. Unsure if today was just a really good day or not- but patient able to stand more consistently without reaching for bar and properly pushing up from armrest. She was able to stand for a good duration with minimal rest and perform some dual task with minimal instructions. She did have some difficulty with posterior leaning but overall improved from recent sessions. Pt will benefit from continued skilled PT to address strength and balance deficits to improve safety for transfers, reduce care giver burden, and allow return to PLOF and possible return home from Assisted living.   OBJECTIVE IMPAIRMENTS: Abnormal gait, cardiopulmonary status limiting activity, decreased activity tolerance, decreased balance, decreased cognition, decreased coordination, decreased endurance, decreased knowledge of use of DME, decreased mobility, difficulty walking, decreased ROM, decreased strength, hypomobility, increased muscle spasms, impaired flexibility, impaired sensation, impaired tone, impaired UE functional use, improper body mechanics, and postural dysfunction.   ACTIVITY LIMITATIONS: carrying, lifting, bending, standing, squatting, stairs, transfers, bed mobility, continence, bathing, toileting, dressing, reach over head, hygiene/grooming, and locomotion level  PARTICIPATION LIMITATIONS: cleaning, interpersonal relationship, shopping, and community activity  PERSONAL FACTORS: 3+ comorbidities: HTN, chronic CVA, Afib, Lupus  are also affecting patient's functional  outcome.   REHAB POTENTIAL: Fair chronic CVA with poor progress in prior interventions with this clinic  CLINICAL DECISION MAKING: Unstable/unpredictable  EVALUATION COMPLEXITY: High  PLAN:  PT FREQUENCY: 1-2x/week  PT DURATION: 12 weeks  PLANNED INTERVENTIONS: 97110-Therapeutic exercises, 97530- Therapeutic activity, [id number]- Neuromuscular re-education, 97535- Self Care, [id number]- Manual therapy, [phone]- Gait training, [phone]- Orthotic Fit/training, [phone]- Aquatic Therapy, [phone]- Splinting, [id number]- Electrical stimulation (unattended), [phone]- Electrical stimulation (manual), Patient/Family education, Balance training, Stair training, Joint mobilization, Vestibular training, Visual/preceptual remediation/compensation, Cognitive remediation, DME instructions, Wheelchair mobility training, Cryotherapy, and Moist heat  PLAN FOR NEXT SESSION:  Continue with functional standing Continue with transfer safety Continue with gait training.   Chyrl London, PT Physical Therapist [place] [phone]

## 2024-05-10 ENCOUNTER — Ambulatory Visit: Admitting: Physical Medicine and Rehabilitation

## 2024-05-14 ENCOUNTER — Ambulatory Visit

## 2024-05-14 ENCOUNTER — Inpatient Hospital Stay: Payer: Medicare Other | Attending: Oncology

## 2024-05-14 ENCOUNTER — Encounter: Admitting: Speech Pathology

## 2024-05-14 ENCOUNTER — Encounter: Payer: Self-pay | Admitting: Oncology

## 2024-05-14 ENCOUNTER — Inpatient Hospital Stay (HOSPITAL_BASED_OUTPATIENT_CLINIC_OR_DEPARTMENT_OTHER): Payer: Medicare Other | Admitting: Oncology

## 2024-05-14 ENCOUNTER — Ambulatory Visit: Admitting: Occupational Therapy

## 2024-05-14 VITALS — BP 162/77 | HR 65 | Temp 97.6°F | Resp 18

## 2024-05-14 DIAGNOSIS — Z7901 Long term (current) use of anticoagulants: Secondary | ICD-10-CM | POA: Insufficient documentation

## 2024-05-14 DIAGNOSIS — N280 Ischemia and infarction of kidney: Secondary | ICD-10-CM

## 2024-05-14 DIAGNOSIS — D649 Anemia, unspecified: Secondary | ICD-10-CM | POA: Insufficient documentation

## 2024-05-14 DIAGNOSIS — R2681 Unsteadiness on feet: Secondary | ICD-10-CM

## 2024-05-14 DIAGNOSIS — Z8673 Personal history of transient ischemic attack (TIA), and cerebral infarction without residual deficits: Secondary | ICD-10-CM | POA: Insufficient documentation

## 2024-05-14 DIAGNOSIS — I4891 Unspecified atrial fibrillation: Secondary | ICD-10-CM | POA: Diagnosis not present

## 2024-05-14 DIAGNOSIS — R2689 Other abnormalities of gait and mobility: Secondary | ICD-10-CM

## 2024-05-14 DIAGNOSIS — R262 Difficulty in walking, not elsewhere classified: Secondary | ICD-10-CM

## 2024-05-14 DIAGNOSIS — R269 Unspecified abnormalities of gait and mobility: Secondary | ICD-10-CM

## 2024-05-14 DIAGNOSIS — R278 Other lack of coordination: Secondary | ICD-10-CM

## 2024-05-14 DIAGNOSIS — M6281 Muscle weakness (generalized): Secondary | ICD-10-CM

## 2024-05-14 LAB — IRON AND TIBC
Iron: 45 ug/dL (ref 28–170)
Saturation Ratios: 15 % (ref 10.4–31.8)
TIBC: 309 ug/dL (ref 250–450)
UIBC: 264 ug/dL

## 2024-05-14 LAB — CBC WITH DIFFERENTIAL (CANCER CENTER ONLY)
Abs Immature Granulocytes: 0 K/uL (ref 0.00–0.07)
Basophils Absolute: 0 K/uL (ref 0.0–0.1)
Basophils Relative: 0 %
Eosinophils Absolute: 0.2 K/uL (ref 0.0–0.5)
Eosinophils Relative: 3 %
HCT: 35.1 % — ABNORMAL LOW (ref 36.0–46.0)
Hemoglobin: 11.3 g/dL — ABNORMAL LOW (ref 12.0–15.0)
Immature Granulocytes: 0 %
Lymphocytes Relative: 37 %
Lymphs Abs: 1.9 K/uL (ref 0.7–4.0)
MCH: 27.6 pg (ref 26.0–34.0)
MCHC: 32.2 g/dL (ref 30.0–36.0)
MCV: 85.8 fL (ref 80.0–100.0)
Monocytes Absolute: 0.4 K/uL (ref 0.1–1.0)
Monocytes Relative: 8 %
Neutro Abs: 2.6 K/uL (ref 1.7–7.7)
Neutrophils Relative %: 52 %
Platelet Count: 232 K/uL (ref 150–400)
RBC: 4.09 MIL/uL (ref 3.87–5.11)
RDW: 15.5 % (ref 11.5–15.5)
WBC Count: 5.1 K/uL (ref 4.0–10.5)
nRBC: 0 % (ref 0.0–0.2)

## 2024-05-14 LAB — FERRITIN: Ferritin: 216 ng/mL (ref 11–307)

## 2024-05-14 NOTE — Progress Notes (Signed)
 Hematology/Oncology Progress note [phone] [phone]         Patient Care Team: Lorel Maxie LABOR, MD as PCP - General (Family Medicine) Cindie Jesusa HERO, RN as Registered Nurse Dannielle Arlean FALCON, RN (Inactive) as Registered Nurse Babara Call, MD as Consulting Physician (Oncology)  CHIEF COMPLAINTS/REASON FOR VISIT:   renal artery thrombosis, chronic anemia.   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  Patient does not have antiphospholipid syndrome. Negative prothrombin gene mutation, negative factor V Leiden mutation.  ANCA is negative.  Per patient and family, INR was checked at nursing home, and Coumadin  dosing was managed by primary care provider.  INR records were not available to me currently. Clinically she is doing well.  Recommend patient to continue Coumadin , goal of INR 2-3   Normocytic anemia Previous workup showed negative M protein on SPEP, slightly increased light chain ratio, nonspecific. Likely anemia of chronic disease.  Hemoglobin has improved. Observation.   Orders Placed This Encounter  Procedures   CBC with Differential (Cancer Center Only)    Standing Status:   Future    Expected Date:   05/14/2025    Expiration Date:   05/14/2025   CMP (Cancer Center only)    Standing Status:   Future    Expected Date:   05/14/2025    Expiration Date:   05/14/2025   Iron and TIBC    Standing Status:   Future    Expected Date:   05/14/2025    Expiration Date:   05/14/2025   Ferritin    Standing Status:   Future    Expected Date:   05/14/2025    Expiration Date:   05/14/2025   Retic Panel    Standing Status:   Future    Expected Date:   05/14/2025    Expiration Date:   05/14/2025   Follow-up in 59-months All questions were answered. The patient knows to call the clinic with any problems, questions or concerns.  Call Babara, MD, PhD [facility]  Hematology Oncology 05/14/2024    HISTORY OF PRESENTING ILLNESS:   Lindsey [name] is a  68 y.o.  female with PMH listed below was seen in consultation at the request of  Aycock, Ngwe A, MD  for evaluation of renal artery thrombosis.  Patient initially presented to emergency room for evaluation of nausea, vomiting, diaphoresis, change of mental status.  Her initial evaluation included CT scan of abdomen pelvis which revealed multifocal pyelonephritis of the right kidney.  Patient was treated with intravenous antibiotics.  At baseline, patient has no history of prior left MCA stroke and right hemiplegia and aphasia.  Patient also has history of atrial fibrillation along has been on Eliquis  5 mg twice daily. 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  There was also concern about seizure activities due to decreased responsiveness and neurology started patient on Keppra .  Patient later was transitioned to Levaquin  and discharged to inpatient rehab on 09/24/2021.  There he developed leukocytosis, tachycardia and a fever again.  09/26/2021 repeat CT scan of the abdomen pelvis showed interim development of left renal artery thrombus.  Patient was transferred back to acute care internal medicine.  Antibodies for antiphospholipid syndrome were collected while patient was on heparin . 09/28/2021  lupus anticoagulant positive Anticardiolipin IgG, IgM negative, IgA 20  Beta-2 glycoprotein IgG, IgM, IgA negative  01/28/2022 repeat lupus anticoagulant was negative.  Patient is on chronic anticoagulation with Coumadin  with INR goal 2-3.   INTERVAL HISTORY Lindsey [name] is a 68 y.o. female who has above history reviewed by me today presents for follow up visit for renal artery thrombosis/CVA Patient is accompanied by her daughter.  She is currently on anticoagulation with Coumadin .  Coumadin  dosing managed by PCP with goal of 2-3 Denies any acute bleeding.  She currently takes 4 mg daily except on Sunday she takes 4.5 mg. patient has no new complaints today.  Review of Systems  Unable to perform ROS: Other (Aphasia)  Constitutional:  Negative for appetite change.  Respiratory:  Negative for shortness of breath.   Cardiovascular:  Negative for chest pain.  Gastrointestinal:  Negative for abdominal pain.  Musculoskeletal:  Negative for arthralgias.  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 No pertinent surgical history.  SOCIAL HISTORY: Social History   Socioeconomic History   Marital status: Single    Spouse name: Not on file   Number of children: Not on file   Years of education: Not on file   Highest education level: Not on file  Occupational History   Not on file  Tobacco Use   Smoking status: Never    Passive exposure: Never   Smokeless tobacco: Never  Vaping Use   Vaping status: Never Used  Substance and Sexual Activity   Alcohol use: Not Currently    Comment: states once a wine/beer occassionally    Drug use: No   Sexual activity: Not Currently  Other Topics Concern   Not on file  Social History Narrative   Not on file   Social Drivers of Health   Financial Resource Strain: Low Risk  (03/30/2018)   Received from [facility]   Overall Financial Resource Strain (CARDIA)    Difficulty of Paying Living Expenses: Not hard at all  Food Insecurity: No Food Insecurity (07/11/2022)   Hunger Vital Sign    Worried  About Running Out of Food in the Last Year: Never true    Ran Out of Food in the Last Year: Never true  Transportation Needs: No Transportation Needs (07/11/2022)   PRAPARE - Administrator, Civil Service (Medical): No    Lack of Transportation (Non-Medical): No  Physical Activity: Not on file  Stress: Not on file  Social Connections: Unknown (03/30/2018)   Received from [facility]   Social Connection and Isolation Panel    Frequency of Communication with Friends and Family: Not on file    Frequency of Social Gatherings with Friends and Family: Not on file    Attends Religious Services: Not on file    Active Member of Clubs or Organizations: Not on file    Attends Banker Meetings: Not on file    Marital Status: Never married  Intimate Partner Violence: Not At Risk (03/30/2018)   Received  from [facility]   Humiliation, Afraid, Rape, and Kick questionnaire    Fear of Current or Ex-Partner: No    Emotionally Abused: No    Physically Abused: No    Sexually Abused: No    FAMILY HISTORY: Family History  Problem Relation Age of Onset   Breast cancer Neg Hx     ALLERGIES:  has no known allergies.  MEDICATIONS:  Current Outpatient Medications  Medication Sig Dispense Refill   acetaminophen  (TYLENOL ) 325 MG tablet Take 1-2 tablets (325-650 mg total) by mouth every 4 (four) hours as needed for mild pain. (Patient taking differently: Take 650 mg by mouth every 4 (four) hours as needed for mild pain (pain score 1-3).)     baclofen  (LIORESAL ) 10 MG tablet Take by mouth.     Baclofen  5 MG TABS Take 1 tablet by mouth at bedtime. 60 tablet 2   cloNIDine (CATAPRES) 0.1 MG tablet Take by mouth daily.     gabapentin  (NEURONTIN ) 100 MG capsule Take 100 mg by mouth daily.     levETIRAcetam  (KEPPRA ) 500 MG tablet TAKE 1 TABLET BY MOUTH EVERY 12 HOURS. (Patient taking differently: Take 500 mg by mouth 2 (two) times daily.) 180 tablet 2   losartan (COZAAR) 50 MG  tablet Take 50 mg by mouth daily.     metoprolol  succinate (TOPROL -XL) 50 MG 24 hr tablet Take 50 mg by mouth 2 (two) times daily.     Multiple Vitamins-Minerals (MULTIVITAMIN ADULTS 50+) TABS Take 1 tablet by mouth daily.     sertraline  (ZOLOFT ) 50 MG tablet Take 50 mg by mouth daily.     Vitamin D , Ergocalciferol , (DRISDOL ) 1.25 MG (50000 UNIT) CAPS capsule Take 1 capsule (50,000 Units total) by mouth every 7 (seven) days. Each Tuesday (Patient taking differently: Take 50,000 Units by mouth every 7 (seven) days. Each Monday) 12 capsule 0   warfarin (COUMADIN ) 1 MG tablet Take 0.5 mg by mouth every evening. In addition to 4mg  tablet every day     warfarin (COUMADIN ) 4 MG tablet Take 4 mg by mouth every evening. With 0.5 mg to equal 4.5 mg daily     No current facility-administered medications for this visit.     PHYSICAL EXAMINATION: ECOG PERFORMANCE STATUS: 3 - Symptomatic, >50% confined to bed Vitals:   05/14/24 1100 05/14/24 1108  BP: (!) 175/79 (!) 162/77  Pulse: 65   Resp: 18   Temp: 97.6 F (36.4 C)   SpO2: 100%    There were no vitals filed for this visit.   Physical Exam Constitutional:      General: She is not in acute distress.    Comments: Patient sits in the wheelchair  HENT:     Head: Normocephalic and atraumatic.  Eyes:     General: No scleral icterus. Cardiovascular:     Rate and Rhythm: Normal rate.     Heart sounds: Normal heart sounds.  Pulmonary:     Effort: Pulmonary effort is normal. No respiratory distress.  Abdominal:     General: Bowel sounds are normal.     Palpations: Abdomen is soft.  Musculoskeletal:        General: Normal range of motion.     Cervical back: Normal range of motion and neck supple.     Right lower leg: No edema.  Skin:    General: Skin is warm and dry.     Findings: No rash.  Neurological:     Mental Status: She is alert. Mental  status is at baseline.     Comments: Aphasia  Right hemiplegia     LABORATORY DATA:  I  have reviewed the data as listed Lab Results  Component Value Date   WBC 5.1 05/14/2024   HGB 11.3 (L) 05/14/2024   HCT 35.1 (L) 05/14/2024   MCV 85.8 05/14/2024   PLT 232 05/14/2024   Recent Labs    08/30/23 1606 08/30/23 1636 11/14/23 1115  NA 136 140 137  K 4.3 4.8 4.5  CL 102 103 101  CO2 25  --  26  GLUCOSE 99 98 86  BUN 13 18 17   CREATININE 0.86 1.00 0.81  CALCIUM  9.2  --  9.4  GFRNONAA >60  --  >60  PROT 8.7*  --  8.6*  ALBUMIN 3.1*  --  3.6  AST 25  --  28  ALT 20  --  23  ALKPHOS 81  --  92  BILITOT 0.4  --  0.5   Iron/TIBC/Ferritin/ %Sat    Component Value Date/Time   IRON 45 05/14/2024 1050   TIBC 309 05/14/2024 1050   FERRITIN 216 05/14/2024 1050   IRONPCTSAT 15 05/14/2024 1050      RADIOGRAPHIC STUDIES: I have personally reviewed the radiological images as listed and agreed with the findings in the report. No results found.

## 2024-05-14 NOTE — Therapy (Signed)
 OUTPATIENT PHYSICAL THERAPY TREATMENT   Patient Name: Amiah Frohlich MRN: [id number] DOB:12-08-1955, 68 y.o., female Today's Date: 05/14/2024  PCP: Lorel Maxie LABOR, MD  REFERRING PROVIDER: Lorilee Sven SQUIBB, MD   END OF SESSION:   PT End of Session - 05/14/24 0858     Visit Number 39    Number of Visits 55    Date for PT Re-Evaluation 07/02/24    Authorization Type Medicare/Medicaid    Progress Note Due on Visit 40    PT Start Time 0850    PT Stop Time 0929    PT Time Calculation (min) 39 min    Equipment Utilized During Treatment Gait belt    Activity Tolerance Patient tolerated treatment well;No increased pain;Patient limited by fatigue    Behavior During Therapy [facility] for tasks assessed/performed               Past Medical History:  Diagnosis Date   Aphasia    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 No pertinent surgical history. 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   REFERRING DIAG:  Diagnosis  I69.398,R25.2 (ICD-10-CM) - Spasticity as late effect of cerebrovascular accident (CVA)    THERAPY DIAG:  Muscle weakness (generalized)  Other lack of coordination  Unsteadiness on feet  Difficulty in walking, not elsewhere classified  Abnormality of gait and mobility  Other abnormalities of gait and mobility  Rationale for Evaluation and Treatment: Rehabilitation  SUBJECTIVE:  SUBJECTIVE STATEMENT: Patient reports feeling okay. Denies any falls  PERTINENT HISTORY:  Daughter present for Evaluation to provide hx. Pt is familiar to this clinic and has been seen for multiple bouts PT since initial CVA in 2021. Pt d/c'ed from PT serviced in April of 2024 with plans to receive PT services through [facility]. Daughter reports that she was only receiving minimal therapy in facility. Reports at least 1 fall in the last 6 months and daughter asked staff to transfer pt with lift since fall.  Daughter would want pt to demonstrate improve safety with transfers to allow bil transfers with reduced use of rail to allow pt to return home for assisted living.    PAIN:  Are you having pain? No  PRECAUTIONS: Fall  RED FLAGS: None   WEIGHT BEARING RESTRICTIONS: No  FALLS: Has patient fallen in last 6 months? Yes. Number of falls 1  LIVING ENVIRONMENT: Lives with: lives with their family and lives in a skilled nursing facility Lives in: Other SNF  Stairs: No Has following equipment at home: Hemi walker and Wheelchair (manual)  PLOF: Requires assistive device for independence, Needs assistance with ADLs, Needs assistance with homemaking, and WC level currently    PATIENT GOALS: move better - walking or transferring with hemi walking   OBJECTIVE:  Note: Objective measures were completed at Evaluation unless otherwise noted.  DIAGNOSTIC FINDINGS:  Head CT:  IMPRESSION: 1. No acute intracranial abnormality. 2. No acute displaced fracture or traumatic listhesis of the cervical spine.  Cervical CT:  MPRESSION: 1. No acute intracranial abnormality. 2. No acute displaced fracture or traumatic listhesis of the cervical spine.  COGNITION: Overall cognitive status: History of cognitive impairments - at baseline   SENSATION: Light touch: Impaired  Proprioception: Impaired  Able to detect depe pressure   COORDINATION: Increased tone on the RLE   EDEMA:  Mild RLE distal edema   MUSCLE TONE: RLE: Mild and Moderate  MUSCLE LENGTH: Hamstrings: grossly limited   POSTURE: rounded shoulders, forward head, and posterior bias   LOWER EXTREMITY ROM:     Active  Right Eval Left Eval  Hip flexion  Limited to 5 deg beyond 90 in sitting  Knee flexion  90deg in sitting  Knee extension  Lacking 10 deg full extension  Ankle dorsiflexion  none  Ankle plantarflexion  none   (Blank rows = not tested)  LOWER EXTREMITY MMT:    MMT Right Eval Left Eval  Hip flexion  2+  Hip extension    Hip abduction  2  Hip adduction  3  Hip internal rotation    Hip external rotation    Knee flexion  2+  Knee extension  3+  Ankle dorsiflexion  0  Ankle plantarflexion  0  Ankle inversion    Ankle eversion    (Blank rows = not tested)  BED MOBILITY:  Sit to supine Mod A Supine to sit Mod A Rolling to Right Min A Rolling to Left Min A  TRANSFERS: Assistive device utilized: Hemi walker  Sit to stand: Mod A Stand to sit: Mod A Chair to chair: Mod A Floor: unable to perform   CURB:  Level of Assistance: Total A Assistive device utilized: rail Curb Comments: unable to perform   GAIT: Gait pattern: non-functional constant posterior bias ,  step to pattern, decreased stance time- Right, Right hip hike, lateral hip instability, and lateral lean- Left Distance walked: 20ft Assistive device utilized: Hemi walker Level of assistance: Mod A Comments:  moderate cues for sequencing of HW and gait pattern in turns   FUNCTIONAL TESTS:  5 times sit to stand: 48 sec with mod Assist from PT  Timed up and go (TUG): unable to perform  10 meter walk test: 6 ft with min-mod assist and HW.  FIST:                                                                                                                               TREATMENT DATE: 05/14/2024   TA:  Static stand at support bar- VC for erect posture and to attempt more 50/50 weight bearing (Tendency to shift to L) x 4 min  -STS from chair minA c LUE pushing up from armrest and reaching back with less VC at support bar x10 reps today  -Self Knee flex/ext to work on her extensor tone and prepare for transfers  -step tap at support bar (used airex pad) x 5 on right (extensive effort) x 10 reps with left - Ambulation in clinic with HW- 65 Feet with CGA and close w/c follow- Patient able to sequence using 3 point gait and able to advance each leg with more consistent effort- less standing rest breaks today.   NMR:  Static stand without UE Support x4 min focusing on erect standing and more anterior weight shift Static stand with dual task- arranging magnet letters in colored arranged boxes on dry erase board - approx 20 min with 2 seated rest breaks (Patient with some initial apraxia but improved with practice- very minimal cues- mostly to lean forward as she has tendency to posterior lean requiring min Assist at time (at least 4) during standing today.       PATIENT EDUCATION: Education details: .  Pt educated throughout session about proper posture and technique with exercises. Improved exercise technique, movement at target joints, use of target muscles after min to mod verbal, visual,  tactile cues  Person educated: Patient Education method: Explanation Education comprehension: verbalized understanding  HOME EXERCISE PROGRAM: Access Code: [email or web address] URL: [email or web address] Date: 12/05/2023 Prepared by: Massie Dollar  Exercises - Sit to Stand with Counter Support  - 1 x daily - 3-4 x weekly - 2 sets - 10 reps - Seated Long Arc Quad  - 1 x daily - 3-4 x weekly - 2 sets - 10 reps - 2 hold - Seated March  - 1 x daily - 3-4 x weekly - 2 sets - 10 reps - 2 hold - Standing Hip Abduction with Counter Support  - 1 x daily - 7 x weekly - 3 sets - 5 reps - Wide Stance with Counter Support  - 1 x daily - 7 x weekly - 3 sets - 2 reps - 30 hold - Seated Hip Abduction  - 1 x daily - 7 x weekly - 3 sets - 10 reps - Supine Bridge  - 1 x daily - 7 x weekly - 3 sets - 10  reps - Supine Short Arc Quad  - 1 x daily - 7 x weekly - 3 sets - 10 reps - Bent Knee Fallouts  - 1 x daily - 7 x weekly - 3 sets - 10 reps - Small Range Straight Leg Raise  - 1 x daily - 7 x weekly - 3 sets - 10 reps - Supine Heel Slide  - 1 x daily - 7 x weekly - 3 sets - 10 reps  GOALS: Goals reviewed with patient? Yes  SHORT TERM GOALS: Target date: 05/10/2024  Patient will be independent in home exercise program to improve strength/mobility for better functional independence with ADLs. Baseline: to be provided on visit 2: 12/28/2023- No caregiver with patient to determine if compliant with HEP Goal  5/13: daugher states that they are completing some exercises almost daily; 04/09/2024= No caregiver present today to f/u with compliance of HEP- Patient verbalized yes and stated standing, moving my legs.  status: IN PROGRESS  LONG TERM GOALS: Target date: 07/02/2024  2.  Patient (> 68 years old) will complete five times sit to stand test by 15 seconds and only min assist indicating an increased LE strength and improved balance. Baseline: 48 with mod assist from PT; 12/28/2023= 44 sec from EOM at lowest  postion (approx 21 in) with Left UE support and CGA to min A from PT 5/13: 28.80 sec with HW from [facility]; min assist from PT for full erect standing; 04/09/2024= 26.56 sec with HW in front- pushing up using LUE on armrest of w/c- Min Assist for full erect standing Goal status: Not Met   3.  Patient will increase FIST score by > 6 points to demonstrate decreased fall risk during functional activities Baseline: 37 5/13: 46/56 Goal status: MET  4.  Patient will complete 10 meter walk test to with HW in less than 1 minute with min assist as to improve gait speed for better community ambulation and to reduce fall risk. Baseline: 6 ft with HW and mod assist; 12/28/2023=18 feet with HW- mod assist  5/13: 2:11 min with HW, min-mod assist. 04/09/2024= Patient ambulated approx 22 feet using HW with Min assist and mod VC for gait sequencing-then experienced LOB requiring Mod Assist to maintain standing. Goal status: PROGRESSING  5.  Patient will perform stand pivot transfer to and from [facility] with HW and CGA for safety to allow safe transfer to toilet with daughter without use of rail on the wall. Baseline: mod assist and heavy use of rail. 12/28/2023= Min to Mod assist with repetitive VC for technique 5/13: min assist overall with HW; mod assist initially both directions  to prevent posterior LOB. 04/09/2024- Min assist physically in both directions yet increased VC for safety- if provided more time- patient able to make correct movements 75% of time during transfers- still inconsistent with reaching back and pivoting on Right LE  Goal status: Progressing  6.  Patient will improve bed mobility to min assist only to reduce care giver burden and improve safety of transfers  Baseline: mod-max assist 5/13: Min-mod assist for sit<>supine. Min assist only for RLE management for rolling; 04/09/2024- unable to test due to time  Goal status: ONGOING  ASSESSMENT:  CLINICAL IMPRESSION:  Patient demonstrated improved overall  standing and walking today. She was more fluid overall with her gait with decreased cues for standing erect  to look ahead. She was able to stand better overall with decreased physical assist and cues today.  Pt will benefit from continued  skilled PT to address strength and balance deficits to improve safety for transfers, reduce care giver burden, and allow return to PLOF and possible return home from Assisted living.   OBJECTIVE IMPAIRMENTS: Abnormal gait, cardiopulmonary status limiting activity, decreased activity tolerance, decreased balance, decreased cognition, decreased coordination, decreased endurance, decreased knowledge of use of DME, decreased mobility, difficulty walking, decreased ROM, decreased strength, hypomobility, increased muscle spasms, impaired flexibility, impaired sensation, impaired tone, impaired UE functional use, improper body mechanics, and postural dysfunction.   ACTIVITY LIMITATIONS: carrying, lifting, bending, standing, squatting, stairs, transfers, bed mobility, continence, bathing, toileting, dressing, reach over head, hygiene/grooming, and locomotion level  PARTICIPATION LIMITATIONS: cleaning, interpersonal relationship, shopping, and community activity  PERSONAL FACTORS: 3+ comorbidities: HTN, chronic CVA, Afib, Lupus  are also affecting patient's functional outcome.   REHAB POTENTIAL: Fair chronic CVA with poor progress in prior interventions with this clinic  CLINICAL DECISION MAKING: Unstable/unpredictable  EVALUATION COMPLEXITY: High  PLAN:  PT FREQUENCY: 1-2x/week  PT DURATION: 12 weeks  PLANNED INTERVENTIONS: 97110-Therapeutic exercises, 97530- Therapeutic activity, [id number]- Neuromuscular re-education, 97535- Self Care, [id number]- Manual therapy, [phone]- Gait training, [phone]- Orthotic Fit/training, [phone]- Aquatic Therapy, [phone]- Splinting, 97014- Electrical stimulation (unattended), [phone]- Electrical stimulation (manual), Patient/Family education, Balance  training, Stair training, Joint mobilization, Vestibular training, Visual/preceptual remediation/compensation, Cognitive remediation, DME instructions, Wheelchair mobility training, Cryotherapy, and Moist heat  PLAN FOR NEXT SESSION:  Continue with functional standing Continue with transfer safety Continue with gait training.   Chyrl London, PT Physical Therapist [place] [facility] [phone]

## 2024-05-14 NOTE — Assessment & Plan Note (Addendum)
#  renal artery thrombosis and recurrent stroke 01/28/2022 repeat lupus anticoagulant was negative.  Patient does not have antiphospholipid syndrome. Negative prothrombin gene mutation, negative factor V Leiden mutation.  ANCA is negative.  Per patient and family, INR was checked at nursing home, and Coumadin  dosing was managed by primary care provider.  INR records were not available to me currently. Clinically she is doing well.  Recommend patient to continue Coumadin , goal of INR 2-3

## 2024-05-14 NOTE — Assessment & Plan Note (Signed)
 Previous workup showed negative M protein on SPEP, slightly increased light chain ratio, nonspecific. Likely anemia of chronic disease.  Hemoglobin has improved. Observation.

## 2024-05-16 ENCOUNTER — Ambulatory Visit

## 2024-05-16 ENCOUNTER — Encounter: Admitting: Speech Pathology

## 2024-05-16 ENCOUNTER — Ambulatory Visit: Admitting: Occupational Therapy

## 2024-05-21 ENCOUNTER — Encounter: Admitting: Speech Pathology

## 2024-05-21 ENCOUNTER — Ambulatory Visit: Admitting: Occupational Therapy

## 2024-05-21 ENCOUNTER — Ambulatory Visit

## 2024-05-23 ENCOUNTER — Encounter: Admitting: Speech Pathology

## 2024-05-23 ENCOUNTER — Ambulatory Visit: Admitting: Physical Therapy

## 2024-05-23 ENCOUNTER — Ambulatory Visit: Admitting: Occupational Therapy

## 2024-05-23 ENCOUNTER — Ambulatory Visit

## 2024-05-23 NOTE — Therapy (Incomplete)
 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   REFERRING DIAG:  Diagnosis  I69.398,R25.2 (ICD-10-CM) - Spasticity as late effect of cerebrovascular accident (CVA)    THERAPY DIAG:  Muscle weakness (generalized)  Unsteadiness on feet  Difficulty in walking, not elsewhere classified  Abnormality of gait and mobility  Other abnormalities of gait and mobility  Rationale for Evaluation and Treatment: Rehabilitation  SUBJECTIVE:                                                                                                                                                                                              SUBJECTIVE STATEMENT: Patient reports feeling okay. Denies any falls  PERTINENT HISTORY:  Daughter present for Evaluation to provide hx. Pt is familiar to this clinic and has been seen for multiple bouts PT since initial CVA in 2021. Pt d/c'ed from PT serviced in April of 2024 with plans to receive PT services through [facility]. Daughter reports that she was only receiving minimal therapy in facility. Reports at least 1 fall in the last 6 months and daughter  asked staff to transfer pt with lift since fall.  Daughter would want pt to demonstrate improve safety with transfers to allow bil transfers with reduced use of rail to allow pt to return home for assisted living.    PAIN:  Are you having pain? No  PRECAUTIONS: Fall  RED FLAGS: None   WEIGHT BEARING RESTRICTIONS: No  FALLS: Has patient fallen in last 6 months? Yes. Number of falls 1  LIVING ENVIRONMENT: Lives with: lives with their family and lives in a skilled nursing facility Lives in: Other SNF  Stairs: No Has following equipment at home: Hemi walker and Wheelchair (manual)  PLOF: Requires assistive device for independence, Needs assistance with ADLs, Needs assistance with homemaking, and WC level currently   PATIENT GOALS: move better - walking or transferring with hemi walking   OBJECTIVE:  Note: Objective measures were completed at Evaluation unless otherwise noted.  DIAGNOSTIC FINDINGS:  Head CT:  IMPRESSION: 1. No acute intracranial abnormality. 2. No acute displaced fracture or traumatic listhesis of the cervical spine.  Cervical CT:  MPRESSION: 1. No acute intracranial abnormality. 2. No acute displaced fracture or traumatic listhesis of the cervical spine.  COGNITION: Overall cognitive status: History of cognitive  impairments - at baseline   SENSATION: Light touch: Impaired  Proprioception: Impaired  Able to detect depe pressure   COORDINATION: Increased tone on the RLE   EDEMA:  Mild RLE distal edema   MUSCLE TONE: RLE: Mild and Moderate  MUSCLE LENGTH: Hamstrings: grossly limited   POSTURE: rounded shoulders, forward head, and posterior bias   LOWER EXTREMITY ROM:     Active  Right Eval Left Eval  Hip flexion  Limited to 5 deg beyond 90 in sitting  Knee flexion  90deg in sitting  Knee extension  Lacking 10 deg full extension  Ankle dorsiflexion  none  Ankle plantarflexion  none   (Blank rows = not tested)  LOWER EXTREMITY MMT:    MMT Right Eval Left Eval  Hip flexion  2+  Hip extension    Hip abduction  2  Hip adduction  3  Hip internal rotation    Hip external rotation    Knee flexion  2+  Knee extension  3+  Ankle dorsiflexion  0  Ankle plantarflexion  0  Ankle inversion    Ankle eversion    (Blank rows = not tested)  BED MOBILITY:  Sit to supine Mod A Supine to sit Mod A Rolling to Right Min A Rolling to Left Min A  TRANSFERS: Assistive device utilized: Hemi walker  Sit to stand: Mod A Stand to sit: Mod A Chair to chair: Mod A Floor: unable to perform   CURB:  Level of Assistance: Total A Assistive device utilized: rail Curb Comments: unable to perform   GAIT: Gait pattern: non-functional constant posterior bias , step to pattern, decreased stance time- Right, Right hip hike, lateral hip instability, and lateral lean- Left Distance walked: 19ft Assistive device utilized: Hemi walker Level of assistance: Mod A Comments: moderate cues for sequencing of HW and gait pattern in turns   FUNCTIONAL TESTS:  5 times sit to stand: 48 sec with mod Assist from PT  Timed up and go (TUG): unable to perform  10 meter walk test: 6 ft with min-mod assist and HW.  FIST:  TREATMENT DATE: 05/23/2024   TA:  Static stand at support bar- VC for erect posture and to attempt more 50/50 weight bearing (Tendency to shift to L) x 4 min  -STS from chair minA c LUE pushing up from armrest and reaching back with less VC at support bar x10 reps today  -Self Knee flex/ext to work on her extensor tone and prepare for transfers  -step tap at support bar (used airex pad) x 5 on right (extensive effort) x 10 reps with left - Ambulation in clinic with HW- 65 Feet with CGA and close w/c follow- Patient able to sequence using 3 point gait and able to advance each leg with more consistent effort- less standing rest breaks today.   NMR:  Static stand without UE Support x4 min focusing on erect standing and more anterior weight shift Static stand with dual task- arranging magnet letters in colored arranged boxes on dry erase board - approx 20 min with 2 seated rest breaks (Patient with some initial apraxia but improved with practice- very minimal cues- mostly to lean forward as she has tendency to posterior lean requiring min Assist at time (at least 4) during standing today.       PATIENT EDUCATION: Education details: .  Pt educated throughout session about proper posture and technique with exercises. Improved exercise technique, movement at target joints, use of target muscles after min to mod verbal, visual, tactile cues  Person educated: Patient Education method: Explanation Education comprehension: verbalized understanding  HOME EXERCISE PROGRAM: Access Code: [email or web address] URL: [email or web address] Date: 12/05/2023 Prepared by: Massie Dollar  Exercises - Sit to Stand with Counter Support  - 1 x daily - 3-4 x weekly - 2 sets - 10 reps - Seated Long Arc Quad  - 1 x daily - 3-4 x weekly - 2 sets - 10 reps - 2 hold - Seated March  - 1 x daily - 3-4 x weekly - 2 sets - 10 reps - 2 hold - Standing Hip Abduction with Counter  Support  - 1 x daily - 7 x weekly - 3 sets - 5 reps - Wide Stance with Counter Support  - 1 x daily - 7 x weekly - 3 sets - 2 reps - 30 hold - Seated Hip Abduction  - 1 x daily - 7 x weekly - 3 sets - 10 reps - Supine Bridge  - 1 x daily - 7 x weekly - 3 sets - 10 reps - Supine Short Arc Quad  - 1 x daily - 7 x weekly - 3 sets - 10 reps - Bent Knee Fallouts  - 1 x daily - 7 x weekly - 3 sets - 10 reps - Small Range Straight Leg Raise  - 1 x daily - 7 x weekly - 3 sets - 10 reps - Supine Heel Slide  - 1 x daily - 7 x weekly - 3 sets - 10 reps  GOALS: Goals reviewed with patient? Yes  SHORT TERM GOALS: Target date: 05/10/2024  Patient will be independent in home exercise program to improve strength/mobility for better functional independence with ADLs. Baseline: to be provided on visit 2: 12/28/2023- No caregiver with patient to determine if compliant with HEP Goal  5/13: daugher states that they are completing some exercises almost daily; 04/09/2024= No caregiver present today to f/u with compliance of HEP- Patient verbalized yes and stated standing, moving my legs.  status: IN PROGRESS  LONG TERM GOALS: Target date: 07/02/2024  2.  Patient (> 22 years old) will complete five times sit to stand test by 15 seconds and only min assist indicating an increased LE strength and improved balance. Baseline: 48 with mod assist from PT; 12/28/2023= 44 sec from EOM at lowest postion (approx 21 in) with Left UE support and CGA to min A from PT 5/13: 28.80 sec with HW from [facility]; min assist from PT for full erect standing; 04/09/2024= 26.56 sec with HW in front- pushing up using LUE on armrest of w/c- Min Assist for full erect standing Goal status: Not Met   3.  Patient will increase FIST score by > 6 points to demonstrate decreased fall risk during functional activities Baseline: 37 5/13: 46/56 Goal status: MET  4.  Patient will complete 10 meter walk test to with HW in less than 1 minute with min assist as to  improve gait speed for better community ambulation and to reduce fall risk. Baseline: 6 ft with HW and mod assist; 12/28/2023=18 feet with HW- mod assist  5/13: 2:11 min with HW, min-mod assist. 04/09/2024= Patient ambulated approx 22 feet using HW with Min assist and mod VC for gait sequencing-then experienced LOB requiring Mod Assist to maintain standing. Goal status: PROGRESSING  5.  Patient will perform stand pivot transfer to and from [facility] with HW and CGA for safety to allow safe transfer to toilet with daughter without use of rail on the wall. Baseline: mod assist and heavy use of rail. 12/28/2023= Min to Mod assist with repetitive VC for technique 5/13: min assist overall with HW; mod assist initially both directions  to prevent posterior LOB. 04/09/2024- Min assist physically in both directions yet increased VC for safety- if provided more time- patient able to make correct movements 75% of time during transfers- still inconsistent with reaching back and pivoting on Right LE  Goal status: Progressing  6.  Patient will improve bed mobility to min assist only to reduce care giver burden and improve safety of transfers  Baseline: mod-max assist 5/13: Min-mod assist for sit<>supine. Min assist only for RLE management for rolling; 04/09/2024- unable to test due to time  Goal status: ONGOING  ASSESSMENT:  CLINICAL IMPRESSION:   Patient demonstrated improved overall standing and walking today. She was more fluid overall with her gait with decreased cues for standing erect  to look ahead. She was able to stand better overall with decreased physical assist and cues today.  Pt will benefit from continued skilled PT to address strength and balance deficits to improve safety for transfers, reduce care giver burden, and allow return to PLOF and possible return home from Assisted living.   OBJECTIVE IMPAIRMENTS: Abnormal gait, cardiopulmonary status limiting activity, decreased activity tolerance, decreased  balance, decreased cognition, decreased coordination, decreased endurance, decreased knowledge of use of DME, decreased mobility, difficulty walking, decreased ROM, decreased strength, hypomobility, increased muscle spasms, impaired flexibility, impaired sensation, impaired tone, impaired UE functional use, improper body mechanics, and postural dysfunction.   ACTIVITY LIMITATIONS: carrying, lifting, bending, standing, squatting, stairs, transfers, bed mobility, continence, bathing, toileting, dressing, reach over head, hygiene/grooming, and locomotion level  PARTICIPATION LIMITATIONS: cleaning, interpersonal relationship, shopping, and community activity  PERSONAL FACTORS: 3+ comorbidities: HTN, chronic CVA, Afib, Lupus  are also affecting patient's functional outcome.   REHAB POTENTIAL: Fair chronic CVA with poor progress in prior interventions with this clinic  CLINICAL DECISION MAKING: Unstable/unpredictable  EVALUATION COMPLEXITY: High  PLAN:  PT FREQUENCY: 1-2x/week  PT DURATION: 12 weeks  PLANNED INTERVENTIONS: 97110-Therapeutic exercises,  02469- Therapeutic activity, [id number]- Neuromuscular re-education, [phone]- Self Care, [id number]- Manual therapy, [phone]- Gait training, [phone]- Orthotic Fit/training, [phone]- Aquatic Therapy, [phone]- Splinting, [id number]- Electrical stimulation (unattended), [phone]- Electrical stimulation (manual), Patient/Family education, Balance training, Stair training, Joint mobilization, Vestibular training, Visual/preceptual remediation/compensation, Cognitive remediation, DME instructions, Wheelchair mobility training, Cryotherapy, and Moist heat  PLAN FOR NEXT SESSION:  Continue with functional standing Continue with transfer safety Continue with gait training.   Note: Portions of this document were prepared using Dragon voice recognition software and although reviewed may contain unintentional dictation errors in syntax, grammar, or spelling.  Lonni KATHEE Gainer PT  ,DPT Physical Therapist- [place]  [facility]

## 2024-05-28 ENCOUNTER — Encounter: Admitting: Speech Pathology

## 2024-05-28 ENCOUNTER — Ambulatory Visit: Admitting: Physical Therapy

## 2024-05-28 ENCOUNTER — Ambulatory Visit: Admitting: Occupational Therapy

## 2024-05-28 DIAGNOSIS — M6281 Muscle weakness (generalized): Secondary | ICD-10-CM

## 2024-05-28 DIAGNOSIS — R278 Other lack of coordination: Secondary | ICD-10-CM

## 2024-05-28 DIAGNOSIS — R269 Unspecified abnormalities of gait and mobility: Secondary | ICD-10-CM

## 2024-05-28 DIAGNOSIS — R262 Difficulty in walking, not elsewhere classified: Secondary | ICD-10-CM

## 2024-05-28 DIAGNOSIS — R482 Apraxia: Secondary | ICD-10-CM

## 2024-05-28 DIAGNOSIS — R2681 Unsteadiness on feet: Secondary | ICD-10-CM

## 2024-05-28 DIAGNOSIS — R2689 Other abnormalities of gait and mobility: Secondary | ICD-10-CM

## 2024-05-28 NOTE — Therapy (Signed)
 OUTPATIENT PHYSICAL THERAPY TREATMENT/ PHYSICAL THERAPY PROGRESS NOTE   Dates of reporting period  04/09/24   to   05/28/2024     Patient Name: Lindsey [name] MRN: [id number] DOB:1955-11-29, 67 y.o., female Today's Date: 05/28/2024  PCP: Lorel Maxie LABOR, MD  REFERRING PROVIDER: Lorilee Sven SQUIBB, MD   END OF SESSION:   PT End of Session - 05/28/24 0857     Visit Number 40    Number of Visits 55    Date for PT Re-Evaluation 07/02/24    Authorization Type Medicare/Medicaid    Progress Note Due on Visit 40    PT Start Time 0853    PT Stop Time 0931    PT Time Calculation (min) 38 min    Equipment Utilized During Treatment Gait belt    Activity Tolerance Patient tolerated treatment well;No increased pain;Patient limited by fatigue    Behavior During Therapy [facility] for tasks assessed/performed               Past Medical History:  Diagnosis Date   Aphasia    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   REFERRING DIAG:  Diagnosis  I69.398,R25.2 (ICD-10-CM) - Spasticity as late effect of cerebrovascular accident (CVA)    THERAPY DIAG:  Muscle weakness (generalized)  Other lack of coordination  Unsteadiness on feet  Difficulty in walking, not elsewhere classified  Abnormality of gait and mobility  Other abnormalities of gait and mobility  Apraxia  Rationale for Evaluation and Treatment: Rehabilitation  SUBJECTIVE:  SUBJECTIVE STATEMENT: Patient reports feeling okay. Denies any falls  PERTINENT HISTORY:  Daughter present for Evaluation to provide hx. Pt is familiar to this clinic and has been seen for multiple bouts PT since initial CVA in 2021. Pt d/c'ed from PT serviced in April of 2024 with plans to receive PT services through [facility]. Daughter reports that she was only receiving minimal therapy in facility. Reports at least 1 fall in the last 6 months and daughter asked staff to transfer pt with lift since fall.  Daughter would want pt to demonstrate improve safety with transfers to allow bil transfers with reduced use of rail to allow pt to return home for assisted living.    PAIN:  Are you having pain? No  PRECAUTIONS: Fall  RED FLAGS: None   WEIGHT BEARING RESTRICTIONS: No  FALLS: Has patient fallen in last 6 months? Yes. Number of falls 1  LIVING ENVIRONMENT: Lives with: lives with their family and lives in a skilled nursing facility Lives in: Other SNF  Stairs: No Has following equipment at home: Hemi walker and Wheelchair (manual)  PLOF: Requires assistive device for independence,  Needs assistance with ADLs, Needs assistance with homemaking, and WC level currently   PATIENT GOALS: move better - walking or transferring with hemi walking   OBJECTIVE:  Note: Objective measures were completed at Evaluation unless otherwise noted.  DIAGNOSTIC FINDINGS:  Head CT:  IMPRESSION: 1. No acute intracranial abnormality. 2. No acute displaced fracture or traumatic listhesis of the cervical spine.  Cervical CT:  MPRESSION: 1. No acute intracranial abnormality. 2. No acute displaced fracture or traumatic listhesis of the cervical spine.  COGNITION: Overall cognitive status: History of cognitive impairments - at baseline   SENSATION: Light touch: Impaired  Proprioception: Impaired  Able to detect depe pressure   COORDINATION: Increased tone on the RLE   EDEMA:  Mild RLE distal edema   MUSCLE TONE: RLE: Mild and Moderate  MUSCLE LENGTH: Hamstrings: grossly limited   POSTURE: rounded shoulders, forward head, and posterior bias   LOWER EXTREMITY ROM:     Active  Right Eval Left Eval  Hip flexion  Limited to 5 deg beyond 90 in sitting  Knee flexion  90deg in sitting  Knee extension  Lacking 10 deg full extension  Ankle dorsiflexion  none  Ankle plantarflexion  none   (Blank rows = not tested)  LOWER EXTREMITY MMT:    MMT Right Eval Left Eval  Hip flexion  2+  Hip extension    Hip abduction  2  Hip adduction  3  Hip internal rotation    Hip external rotation    Knee flexion  2+  Knee extension  3+  Ankle dorsiflexion  0  Ankle plantarflexion  0  Ankle inversion    Ankle eversion    (Blank rows = not tested)  BED MOBILITY:  Sit to supine Mod A Supine to sit Mod A Rolling to Right Min A Rolling to Left Min A  TRANSFERS: Assistive device utilized: Hemi walker  Sit to stand: Mod A Stand to sit: Mod A Chair to chair: Mod A Floor: unable to perform   CURB:  Level of Assistance: Total A Assistive device utilized: rail Curb Comments:  unable to perform   GAIT: Gait pattern: non-functional constant posterior bias , step to pattern, decreased stance time- Right, Right hip hike, lateral hip instability, and lateral lean- Left Distance walked: 63ft Assistive device utilized: Hemi walker Level of assistance: Mod A Comments:  moderate cues for sequencing of HW and gait pattern in turns   FUNCTIONAL TESTS:  5 times sit to stand: 48 sec with mod Assist from PT  Timed up and go (TUG): unable to perform  10 meter walk test: 6 ft with min-mod assist and HW.  FIST:                                                                                                                               TREATMENT DATE: 05/28/2024    Pt performed 5 time sit<>stand (5xSTS): 25.14 sec (27.57sec and 22.71 sec)(>15 sec indicates increased fall risk)   Performed stand pivot transfer to and from bed x 3 bil with mod assist on each bout due to increased posterior bias and difficulty maintain knee extension on the RLE in turn.   10 Meter Walk Test: Patient instructed to walk 10 meters (32.8 ft) as quickly and as safely as possible at their normal speed x2 and at a fast speed x2. Time measured from 2 meter mark to 8 meter mark to accommodate ramp-up and ramp-down.   Average Normal speed: 2:32min with HW and min fading to mod assist (165 sec)(0 .65m/s) Cut off scores: <0.4 m/s = household Ambulator, 0.4-0.8 m/s = limited community Ambulator, >0.8 m/s = community Ambulator, >1.2 m/s = crossing a street, <1.0 = increased fall risk MCID 0.05 m/s (small), 0.13 m/s (moderate), 0.06 m/s (significant)  (ANPTA Core Set of Outcome Measures for Adults with Neurologic Conditions, 2018)  Bed mobility with mod assist into and out of supine for management of the RLE as well as trunk, on this day. Min assist for management of the RLE only for rolling R and L.    PATIENT EDUCATION: Education details: .  Pt educated throughout session about proper posture and  technique with exercises. Improved exercise technique, movement at target joints, use of target muscles after min to mod verbal, visual, tactile cues Interpretation of outcome measure; need for continued progress to justify continued PT services.   Person educated: Patient Education method: Explanation Education comprehension: verbalized understanding  HOME EXERCISE PROGRAM: Access Code: [email or web address] URL: [email or web address] Date: 12/05/2023 Prepared by: Massie Dollar  Exercises - Sit to Stand with Counter Support  - 1 x daily - 3-4 x weekly - 2 sets - 10 reps - Seated Long Arc Quad  - 1 x daily - 3-4 x weekly - 2 sets - 10 reps - 2 hold - Seated March  - 1 x daily - 3-4 x weekly - 2 sets - 10 reps - 2 hold - Standing Hip Abduction with Counter Support  - 1 x daily - 7 x weekly - 3 sets - 5 reps - Wide Stance with Counter Support  - 1 x daily - 7 x weekly - 3 sets - 2 reps - 30 hold - Seated Hip Abduction  - 1 x daily - 7 x weekly - 3 sets - 10 reps -  Supine Bridge  - 1 x daily - 7 x weekly - 3 sets - 10 reps - Supine Short Arc Quad  - 1 x daily - 7 x weekly - 3 sets - 10 reps - Bent Knee Fallouts  - 1 x daily - 7 x weekly - 3 sets - 10 reps - Small Range Straight Leg Raise  - 1 x daily - 7 x weekly - 3 sets - 10 reps - Supine Heel Slide  - 1 x daily - 7 x weekly - 3 sets - 10 reps  GOALS: Goals reviewed with patient? Yes  SHORT TERM GOALS: Target date: 05/10/2024  Patient will be independent in home exercise program to improve strength/mobility for better functional independence with ADLs. Baseline: to be provided on visit 2: 12/28/2023- No caregiver with patient to determine if compliant with HEP Goal  5/13: daugher states that they are completing some exercises almost daily; 04/09/2024= No caregiver present today to f/u with compliance of HEP- Patient verbalized yes and stated standing, moving my legs.  status: IN PROGRESS  LONG TERM GOALS: Target date: 07/02/2024  2.   Patient (> 32 years old) will complete five times sit to stand test by 15 seconds and only min assist indicating an increased LE strength and improved balance. Baseline: 48 with mod assist from PT; 12/28/2023= 44 sec from EOM at lowest postion (approx 21 in) with Left UE support and CGA to min A from PT 5/13: 28.80 sec with HW from [facility]; min assist from PT for full erect standing; 04/09/2024= 26.56 sec with HW in front- pushing up using LUE on armrest of w/c- Min Assist for full erect standing 8/26: 25.41 sec with UE pushing from arm rest and reaching for rail on wall; min a to mod assist from PT throughout all repetitions for adequate anterior weight shift  Goal status: Not Met   3.  Patient will increase FIST score by > 6 points to demonstrate decreased fall risk during functional activities Baseline: 37 5/13: 46/56 Goal status: MET  4.  Patient will complete 10 meter walk test to with HW in less than 1 minute with min assist as to improve gait speed for better community ambulation and to reduce fall risk. Baseline: 6 ft with HW and mod assist; 12/28/2023=18 feet with HW- mod assist  5/13: 2:11 min with HW, min-mod assist. 04/09/2024= Patient ambulated approx 22 feet using HW with Min assist and mod VC for gait sequencing-then experienced LOB requiring Mod Assist to maintain standing. 8/26: 2:93min (0 .42m/s) with HW and min fading to mod assist to advance the RLE with fatigue (165 sec) Goal status: PROGRESSING  5.  Patient will perform stand pivot transfer to and from [facility] with HW and CGA for safety to allow safe transfer to toilet with daughter without use of rail on the wall. Baseline: mod assist and heavy use of rail. 12/28/2023= Min to Mod assist with repetitive VC for technique 5/13: min assist overall with HW; mod assist initially both directions  to prevent posterior LOB. 04/09/2024- Min assist physically in both directions yet increased VC for safety- if provided more time- patient able to make  correct movements 75% of time during transfers- still inconsistent with reaching back and pivoting on Right LE  8/26: mod assist from PT to the R and L on this day due to posterior LOB  Goal status: Progressing  6.  Patient will improve bed mobility to min assist only to reduce care giver burden  and improve safety of transfers  Baseline: mod-max assist 5/13: Min-mod assist for sit<>supine. Min assist only for RLE management for rolling; 04/09/2024- unable to test due to time  8/26. Mod assist for sit<>supine movement at trunk and RLE and min assist for rolling tasks on this day.  Goal status: ONGOING  ASSESSMENT:  CLINICAL IMPRESSION:  PT treatment focused on goal assessment for progress note. Pt demonstrates slight improvement in functional status since progress note and goal assessment in July, but has still regressed in most outcomes since May of 2025. Was able to perform 5x STS with slightly decreased time today, but UE support required from rail instead of HW. Also was able to complete 10 MWT, but mod assist for advancement of RLE for last 12 ft due to to increased tone with fatigue. All transfers to and from [facility] required mod assist on this day with increased posterior bias with all mobility. Patient's condition has the potential to slightly improve in response to therapy. Maximum improvement is yet to be obtained. The anticipated improvement is attainable and reasonable in a generally predictable time; with improved consistency of attendance of PT treatments. .  Pt will benefit from continued skilled PT to address strength and balance deficits to improve safety for transfers, reduce care giver burden, and allow return to PLOF and possible return home from Assisted living.   OBJECTIVE IMPAIRMENTS: Abnormal gait, cardiopulmonary status limiting activity, decreased activity tolerance, decreased balance, decreased cognition, decreased coordination, decreased endurance, decreased knowledge of use of  DME, decreased mobility, difficulty walking, decreased ROM, decreased strength, hypomobility, increased muscle spasms, impaired flexibility, impaired sensation, impaired tone, impaired UE functional use, improper body mechanics, and postural dysfunction.   ACTIVITY LIMITATIONS: carrying, lifting, bending, standing, squatting, stairs, transfers, bed mobility, continence, bathing, toileting, dressing, reach over head, hygiene/grooming, and locomotion level  PARTICIPATION LIMITATIONS: cleaning, interpersonal relationship, shopping, and community activity  PERSONAL FACTORS: 3+ comorbidities: HTN, chronic CVA, Afib, Lupus  are also affecting patient's functional outcome.   REHAB POTENTIAL: Fair chronic CVA with poor progress in prior interventions with this clinic  CLINICAL DECISION MAKING: Unstable/unpredictable  EVALUATION COMPLEXITY: High  PLAN:  PT FREQUENCY: 1-2x/week  PT DURATION: 12 weeks  PLANNED INTERVENTIONS: 97110-Therapeutic exercises, 97530- Therapeutic activity, [id number]- Neuromuscular re-education, 97535- Self Care, [id number]- Manual therapy, [phone]- Gait training, [phone]- Orthotic Fit/training, [phone]- Aquatic Therapy, [phone]- Splinting, [id number]- Electrical stimulation (unattended), [phone]- Electrical stimulation (manual), Patient/Family education, Balance training, Stair training, Joint mobilization, Vestibular training, Visual/preceptual remediation/compensation, Cognitive remediation, DME instructions, Wheelchair mobility training, Cryotherapy, and Moist heat  PLAN FOR NEXT SESSION:    Continue with functional standing Continue with transfer safety Continue with gait training.   Massie Dollar PT, DPT  Physical Therapist - [place]  [facility]  9:59 AM 05/28/24

## 2024-05-30 ENCOUNTER — Ambulatory Visit: Admitting: Occupational Therapy

## 2024-05-30 ENCOUNTER — Ambulatory Visit

## 2024-05-30 ENCOUNTER — Encounter: Admitting: Speech Pathology

## 2024-06-03 NOTE — Therapy (Signed)
 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   REFERRING DIAG:  Diagnosis  I69.398,R25.2 (ICD-10-CM) - Spasticity as late effect of cerebrovascular accident (CVA)    THERAPY DIAG:  No diagnosis found.  Rationale for Evaluation and Treatment: Rehabilitation  SUBJECTIVE:                                                                                                                                                                                              SUBJECTIVE STATEMENT: Patient reports feeling okay. Denies any falls  PERTINENT HISTORY:  Daughter present for Evaluation to provide hx. Pt is familiar to this clinic and has been seen for multiple bouts PT since initial CVA in 2021. Pt d/c'ed from PT serviced in April of 2024 with plans to receive PT services through [facility]. Daughter reports that she was only receiving minimal therapy in facility. Reports at least 1 fall in the last 6 months and daughter asked staff to transfer  pt with lift since fall.  Daughter would want pt to demonstrate improve safety with transfers to allow bil transfers with reduced use of rail to allow pt to return home for assisted living.    PAIN:  Are you having pain? No  PRECAUTIONS: Fall  RED FLAGS: None   WEIGHT BEARING RESTRICTIONS: No  FALLS: Has patient fallen in last 6 months? Yes. Number of falls 1  LIVING ENVIRONMENT: Lives with: lives with their family and lives in a skilled nursing facility Lives in: Other SNF  Stairs: No Has following equipment at home: Hemi walker and Wheelchair (manual)  PLOF: Requires assistive device for independence, Needs assistance with ADLs, Needs assistance with homemaking, and WC level currently   PATIENT GOALS: move better - walking or transferring with hemi walking   OBJECTIVE:  Note: Objective measures were completed at Evaluation unless otherwise noted.  DIAGNOSTIC FINDINGS:  Head CT:  IMPRESSION: 1. No acute intracranial abnormality. 2. No acute displaced fracture or traumatic listhesis of the cervical spine.  Cervical CT:  MPRESSION: 1. No acute intracranial abnormality. 2. No acute displaced fracture or traumatic listhesis of the cervical spine.  COGNITION: Overall cognitive status: History of cognitive impairments - at baseline   SENSATION: Light touch: Impaired   Proprioception: Impaired  Able to detect depe pressure   COORDINATION: Increased tone on the RLE   EDEMA:  Mild RLE distal edema   MUSCLE TONE: RLE: Mild and Moderate  MUSCLE LENGTH: Hamstrings: grossly limited   POSTURE: rounded shoulders, forward head, and posterior bias   LOWER EXTREMITY ROM:     Active  Right Eval Left Eval  Hip flexion  Limited to 5 deg beyond 90 in sitting  Knee flexion  90deg in sitting  Knee extension  Lacking 10 deg full extension  Ankle dorsiflexion  none  Ankle plantarflexion  none   (Blank rows = not tested)  LOWER EXTREMITY MMT:    MMT Right Eval Left Eval  Hip flexion  2+  Hip extension    Hip abduction  2  Hip adduction  3  Hip internal rotation    Hip external rotation    Knee flexion  2+  Knee extension  3+  Ankle dorsiflexion  0  Ankle plantarflexion  0  Ankle inversion    Ankle eversion    (Blank rows = not tested)  BED MOBILITY:  Sit to supine Mod A Supine to sit Mod A Rolling to Right Min A Rolling to Left Min A  TRANSFERS: Assistive device utilized: Hemi walker  Sit to stand: Mod A Stand to sit: Mod A Chair to chair: Mod A Floor: unable to perform   CURB:  Level of Assistance: Total A Assistive device utilized: rail Curb Comments: unable to perform   GAIT: Gait pattern: non-functional constant posterior bias , step to pattern, decreased stance time- Right, Right hip hike, lateral hip instability, and lateral lean- Left Distance walked: 6ft Assistive device utilized: Hemi walker Level of assistance: Mod A Comments: moderate cues for sequencing of HW and gait pattern in turns   FUNCTIONAL TESTS:  5 times sit to stand: 48 sec with mod Assist from PT  Timed up and go (TUG): unable to perform  10 meter walk test: 6 ft with min-mod assist and HW.  FIST:  TREATMENT DATE:  06/03/2024    Pt performed 5 time sit<>stand (5xSTS): 25.14 sec (27.57sec and 22.71 sec)(>15 sec indicates increased fall risk)   Performed stand pivot transfer to and from bed x 3 bil with mod assist on each bout due to increased posterior bias and difficulty maintain knee extension on the RLE in turn.   10 Meter Walk Test: Patient instructed to walk 10 meters (32.8 ft) as quickly and as safely as possible at their normal speed x2 and at a fast speed x2. Time measured from 2 meter mark to 8 meter mark to accommodate ramp-up and ramp-down.   Average Normal speed: 2:29min with HW and min fading to mod assist (165 sec)(0 .1m/s) Cut off scores: <0.4 m/s = household Ambulator, 0.4-0.8 m/s = limited community Ambulator, >0.8 m/s = community Ambulator, >1.2 m/s = crossing a street, <1.0 = increased fall risk MCID 0.05 m/s (small), 0.13 m/s (moderate), 0.06 m/s (significant)  (ANPTA Core Set of Outcome Measures for Adults with Neurologic Conditions, 2018)  Bed mobility with mod assist into and out of supine for management of the RLE as well as trunk, on this day. Min assist for management of the RLE only for rolling R and L.    PATIENT EDUCATION: Education details: .  Pt educated throughout session about proper posture and technique with exercises. Improved exercise technique, movement at target joints, use of target muscles after min to mod verbal, visual, tactile cues Interpretation of outcome measure; need for continued progress to justify continued PT services.   Person educated: Patient Education method: Explanation Education comprehension: verbalized understanding  HOME EXERCISE PROGRAM: Access Code: [email or web address] URL: [email or web address] Date: 12/05/2023 Prepared by: Massie Dollar  Exercises - Sit to Stand with Counter Support  - 1 x daily - 3-4 x weekly - 2 sets - 10 reps - Seated Long Arc Quad  - 1 x daily - 3-4 x weekly - 2 sets - 10 reps - 2 hold - Seated March  - 1  x daily - 3-4 x weekly - 2 sets - 10 reps - 2 hold - Standing Hip Abduction with Counter Support  - 1 x daily - 7 x weekly - 3 sets - 5 reps - Wide Stance with Counter Support  - 1 x daily - 7 x weekly - 3 sets - 2 reps - 30 hold - Seated Hip Abduction  - 1 x daily - 7 x weekly - 3 sets - 10 reps - Supine Bridge  - 1 x daily - 7 x weekly - 3 sets - 10 reps - Supine Short Arc Quad  - 1 x daily - 7 x weekly - 3 sets - 10 reps - Bent Knee Fallouts  - 1 x daily - 7 x weekly - 3 sets - 10 reps - Small Range Straight Leg Raise  - 1 x daily - 7 x weekly - 3 sets - 10 reps - Supine Heel Slide  - 1 x daily - 7 x weekly - 3 sets - 10 reps  GOALS: Goals reviewed with patient? Yes  SHORT TERM GOALS: Target date: 05/10/2024  Patient will be independent in home exercise program to improve strength/mobility for better functional independence with ADLs. Baseline: to be provided on visit 2: 12/28/2023- No caregiver with patient to determine if compliant with HEP Goal  5/13: daugher states that they are completing some exercises almost daily; 04/09/2024= No caregiver present today to f/u with compliance of HEP- Patient verbalized yes and stated  standing, moving my legs.  status: IN PROGRESS  LONG TERM GOALS: Target date: 07/02/2024  2.  Patient (> 43 years old) will complete five times sit to stand test by 15 seconds and only min assist indicating an increased LE strength and improved balance. Baseline: 48 with mod assist from PT; 12/28/2023= 44 sec from EOM at lowest postion (approx 21 in) with Left UE support and CGA to min A from PT 5/13: 28.80 sec with HW from [facility]; min assist from PT for full erect standing; 04/09/2024= 26.56 sec with HW in front- pushing up using LUE on armrest of w/c- Min Assist for full erect standing 8/26: 25.41 sec with UE pushing from arm rest and reaching for rail on wall; min a to mod assist from PT throughout all repetitions for adequate anterior weight shift  Goal status: Not Met   3.   Patient will increase FIST score by > 6 points to demonstrate decreased fall risk during functional activities Baseline: 37 5/13: 46/56 Goal status: MET  4.  Patient will complete 10 meter walk test to with HW in less than 1 minute with min assist as to improve gait speed for better community ambulation and to reduce fall risk. Baseline: 6 ft with HW and mod assist; 12/28/2023=18 feet with HW- mod assist  5/13: 2:11 min with HW, min-mod assist. 04/09/2024= Patient ambulated approx 22 feet using HW with Min assist and mod VC for gait sequencing-then experienced LOB requiring Mod Assist to maintain standing. 8/26: 2:61min (0 .18m/s) with HW and min fading to mod assist to advance the RLE with fatigue (165 sec) Goal status: PROGRESSING  5.  Patient will perform stand pivot transfer to and from [facility] with HW and CGA for safety to allow safe transfer to toilet with daughter without use of rail on the wall. Baseline: mod assist and heavy use of rail. 12/28/2023= Min to Mod assist with repetitive VC for technique 5/13: min assist overall with HW; mod assist initially both directions  to prevent posterior LOB. 04/09/2024- Min assist physically in both directions yet increased VC for safety- if provided more time- patient able to make correct movements 75% of time during transfers- still inconsistent with reaching back and pivoting on Right LE  8/26: mod assist from PT to the R and L on this day due to posterior LOB  Goal status: Progressing  6.  Patient will improve bed mobility to min assist only to reduce care giver burden and improve safety of transfers  Baseline: mod-max assist 5/13: Min-mod assist for sit<>supine. Min assist only for RLE management for rolling; 04/09/2024- unable to test due to time  8/26. Mod assist for sit<>supine movement at trunk and RLE and min assist for rolling tasks on this day.  Goal status: ONGOING  ASSESSMENT:  CLINICAL IMPRESSION:  PT treatment focused on goal assessment for  progress note. Pt demonstrates slight improvement in functional status since progress note and goal assessment in July, but has still regressed in most outcomes since May of 2025. Was able to perform 5x STS with slightly decreased time today, but UE support required from rail instead of HW. Also was able to complete 10 MWT, but mod assist for advancement of RLE for last 12 ft due to to increased tone with fatigue. All transfers to and from [facility] required mod assist on this day with increased posterior bias with all mobility. Patient's condition has the potential to slightly improve in response to therapy. Maximum improvement is yet to  be obtained. The anticipated improvement is attainable and reasonable in a generally predictable time; with improved consistency of attendance of PT treatments. .  Pt will benefit from continued skilled PT to address strength and balance deficits to improve safety for transfers, reduce care giver burden, and allow return to PLOF and possible return home from Assisted living.   OBJECTIVE IMPAIRMENTS: Abnormal gait, cardiopulmonary status limiting activity, decreased activity tolerance, decreased balance, decreased cognition, decreased coordination, decreased endurance, decreased knowledge of use of DME, decreased mobility, difficulty walking, decreased ROM, decreased strength, hypomobility, increased muscle spasms, impaired flexibility, impaired sensation, impaired tone, impaired UE functional use, improper body mechanics, and postural dysfunction.   ACTIVITY LIMITATIONS: carrying, lifting, bending, standing, squatting, stairs, transfers, bed mobility, continence, bathing, toileting, dressing, reach over head, hygiene/grooming, and locomotion level  PARTICIPATION LIMITATIONS: cleaning, interpersonal relationship, shopping, and community activity  PERSONAL FACTORS: 3+ comorbidities: HTN, chronic CVA, Afib, Lupus  are also affecting patient's functional outcome.   REHAB POTENTIAL:  Fair chronic CVA with poor progress in prior interventions with this clinic  CLINICAL DECISION MAKING: Unstable/unpredictable  EVALUATION COMPLEXITY: High  PLAN:  PT FREQUENCY: 1-2x/week  PT DURATION: 12 weeks  PLANNED INTERVENTIONS: 97110-Therapeutic exercises, 97530- Therapeutic activity, [id number]- Neuromuscular re-education, 97535- Self Care, [id number]- Manual therapy, [phone]- Gait training, [phone]- Orthotic Fit/training, [phone]- Aquatic Therapy, [phone]- Splinting, [id number]- Electrical stimulation (unattended), [phone]- Electrical stimulation (manual), Patient/Family education, Balance training, Stair training, Joint mobilization, Vestibular training, Visual/preceptual remediation/compensation, Cognitive remediation, DME instructions, Wheelchair mobility training, Cryotherapy, and Moist heat  PLAN FOR NEXT SESSION:    Continue with functional standing Continue with transfer safety Continue with gait training.   Chyrl London, PT Physical Therapist - [facility]  10:08 PM 06/03/24

## 2024-06-04 ENCOUNTER — Ambulatory Visit: Admitting: Occupational Therapy

## 2024-06-04 ENCOUNTER — Encounter: Admitting: Speech Pathology

## 2024-06-04 ENCOUNTER — Ambulatory Visit: Attending: Physical Medicine and Rehabilitation

## 2024-06-04 DIAGNOSIS — R482 Apraxia: Secondary | ICD-10-CM | POA: Diagnosis present

## 2024-06-04 DIAGNOSIS — R41841 Cognitive communication deficit: Secondary | ICD-10-CM | POA: Diagnosis present

## 2024-06-04 DIAGNOSIS — R262 Difficulty in walking, not elsewhere classified: Secondary | ICD-10-CM | POA: Insufficient documentation

## 2024-06-04 DIAGNOSIS — R2681 Unsteadiness on feet: Secondary | ICD-10-CM | POA: Insufficient documentation

## 2024-06-04 DIAGNOSIS — R2689 Other abnormalities of gait and mobility: Secondary | ICD-10-CM | POA: Diagnosis present

## 2024-06-04 DIAGNOSIS — R269 Unspecified abnormalities of gait and mobility: Secondary | ICD-10-CM | POA: Diagnosis present

## 2024-06-04 DIAGNOSIS — M6281 Muscle weakness (generalized): Secondary | ICD-10-CM | POA: Insufficient documentation

## 2024-06-04 DIAGNOSIS — R278 Other lack of coordination: Secondary | ICD-10-CM | POA: Diagnosis present

## 2024-06-05 NOTE — Therapy (Signed)
 OUTPATIENT PHYSICAL THERAPY TREATMENT    Patient Name: Lindsey [name] MRN: [id number] DOB:January 16, 1956, 68 y.o., female Today's Date: 06/06/2024  PCP: Lorel Maxie LABOR, MD  REFERRING PROVIDER: Lorilee Sven SQUIBB, MD   END OF SESSION:   PT End of Session - 06/06/24 0910     Visit Number 42    Number of Visits 55    Date for PT Re-Evaluation 07/02/24    Authorization Type Medicare/Medicaid    Progress Note Due on Visit 50    PT Start Time 0847    PT Stop Time 0928    PT Time Calculation (min) 41 min    Equipment Utilized During Treatment Gait belt    Activity Tolerance Patient tolerated treatment well;No increased pain;Patient limited by fatigue    Behavior During Therapy [facility] for tasks assessed/performed                Past Medical History:  Diagnosis Date   Aphasia    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 No pertinent surgical history. 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   REFERRING DIAG:  Diagnosis  I69.398,R25.2 (ICD-10-CM) - Spasticity as late effect of cerebrovascular accident (CVA)    THERAPY DIAG:  Muscle weakness (generalized)  Other lack of coordination  Unsteadiness on feet  Difficulty in walking, not elsewhere classified  Abnormality of gait and mobility  Other abnormalities of gait and mobility  Apraxia  Cognitive communication deficit  Rationale for Evaluation and Treatment: Rehabilitation  SUBJECTIVE:  SUBJECTIVE STATEMENT: Patient reports doing okay- denies any pain and when asked what is most concerning- patient responded I want to be able to walk.  PERTINENT HISTORY:  Daughter present for Evaluation to provide hx. Pt is familiar to this clinic and has been seen for multiple bouts PT since initial CVA in 2021. Pt d/c'ed from PT serviced in April of 2024 with plans to receive PT services through [facility]. Daughter reports that she was only receiving minimal therapy in facility. Reports at least 1 fall in the last 6 months and daughter asked staff to transfer pt with lift since fall.  Daughter would want pt to demonstrate improve safety with transfers to allow bil transfers with reduced use of rail to allow pt to return home for assisted living.    PAIN:  Are you having pain? No  PRECAUTIONS: Fall  RED FLAGS: None   WEIGHT BEARING RESTRICTIONS: No  FALLS: Has patient fallen in last 6 months? Yes. Number of falls 1  LIVING ENVIRONMENT: Lives with: lives with their family and lives in a skilled nursing facility Lives in: Other SNF  Stairs: No Has following equipment at home: Hemi walker and Wheelchair (manual)  PLOF:  Requires assistive device for independence, Needs assistance with ADLs, Needs assistance with homemaking, and WC level currently   PATIENT GOALS: move better - walking or transferring with hemi walking   OBJECTIVE:  Note: Objective measures were completed at Evaluation unless otherwise noted.  DIAGNOSTIC FINDINGS:  Head CT:  IMPRESSION: 1. No acute intracranial abnormality. 2. No acute displaced fracture or traumatic listhesis of the cervical spine.  Cervical CT:  MPRESSION: 1. No acute intracranial abnormality. 2. No acute displaced fracture or traumatic listhesis of the cervical spine.  COGNITION: Overall cognitive status: History of cognitive impairments - at baseline   SENSATION: Light touch: Impaired  Proprioception: Impaired  Able to detect depe pressure   COORDINATION: Increased tone on the RLE   EDEMA:  Mild RLE distal edema   MUSCLE TONE: RLE: Mild and Moderate  MUSCLE LENGTH: Hamstrings: grossly limited   POSTURE: rounded shoulders, forward head, and posterior bias   LOWER EXTREMITY ROM:     Active  Right Eval Left Eval  Hip flexion  Limited to 5 deg beyond 90 in sitting  Knee flexion  90deg in sitting  Knee extension  Lacking 10 deg full extension  Ankle dorsiflexion  none  Ankle plantarflexion  none   (Blank rows = not tested)  LOWER EXTREMITY MMT:    MMT Right Eval Left Eval  Hip flexion  2+  Hip extension    Hip abduction  2  Hip adduction  3  Hip internal rotation    Hip external rotation    Knee flexion  2+  Knee extension  3+  Ankle dorsiflexion  0  Ankle plantarflexion  0  Ankle inversion    Ankle eversion    (Blank rows = not tested)  BED MOBILITY:  Sit to supine Mod A Supine to sit Mod A Rolling to Right Min A Rolling to Left Min A  TRANSFERS: Assistive device utilized: Hemi walker  Sit to stand: Mod A Stand to sit: Mod A Chair to chair: Mod A Floor: unable to perform   CURB:  Level of Assistance: Total  A Assistive device utilized: rail Curb Comments: unable to perform   GAIT: Gait pattern: non-functional constant posterior bias , step to pattern, decreased stance time- Right, Right hip hike, lateral hip instability, and lateral  lean- Left Distance walked: 55ft Assistive device utilized: Hemi walker Level of assistance: Mod A Comments: moderate cues for sequencing of HW and gait pattern in turns   FUNCTIONAL TESTS:  5 times sit to stand: 48 sec with mod Assist from PT  Timed up and go (TUG): unable to perform  10 meter walk test: 6 ft with min-mod assist and HW.  FIST:                                                                                                                               TREATMENT DATE: 06/06/2024  TA: - Sit to stand - Continuing with focus on consistency with hand placement and ability to reach back with stand to sit. Patient continues to demonstrate inconsistency during visit x 8 reps total today.    TE:  PROM to R knee in sitting prior to walking- secondary to increase tightness  GAIT:  Patient ambulated approx 75 feet x 2 with HW, CGA with verbal cues for sequencing- mostly step to gait with occasional step through- Min VC for posture - slow overall cadence- yet no LOB.   10 Meter Walk Test: Patient instructed to walk 10 meters (32.8 ft) as quickly and as safely as possible at their normal speed x2 and at a fast speed x2. Time measured from 2 meter mark to 8 meter mark to accommodate ramp-up and ramp-down.  Normal speed 1: 0.08 m/s Normal speed 2: 0.09 m/s Average Normal speed: 0.085 m/s Cut off scores: <0.4 m/s = household Ambulator, 0.4-0.8 m/s = limited community Ambulator, >0.8 m/s = community Ambulator, >1.2 m/s = crossing a street, <1.0 = increased fall risk MCID 0.05 m/s (small), 0.13 m/s (moderate), 0.06 m/s (significant)  (ANPTA Core Set of Outcome Measures for Adults with Neurologic Conditions, 2018)   PATIENT EDUCATION: Education details: .   Pt educated throughout session about proper posture and technique with exercises. Improved exercise technique, movement at target joints, use of target muscles after min to mod verbal, visual, tactile cues Interpretation of outcome measure; need for continued progress to justify continued PT services.   Person educated: Patient Education method: Explanation Education comprehension: verbalized understanding  HOME EXERCISE PROGRAM: Access Code: [email or web address] URL: [email or web address] Date: 12/05/2023 Prepared by: Massie Dollar  Exercises - Sit to Stand with Counter Support  - 1 x daily - 3-4 x weekly - 2 sets - 10 reps - Seated Long Arc Quad  - 1 x daily - 3-4 x weekly - 2 sets - 10 reps - 2 hold - Seated March  - 1 x daily - 3-4 x weekly - 2 sets - 10 reps - 2 hold - Standing Hip Abduction with Counter Support  - 1 x daily - 7 x weekly - 3 sets - 5 reps - Wide Stance with Counter Support  - 1 x daily - 7 x weekly - 3 sets - 2 reps - 30 hold - Seated Hip Abduction  -  1 x daily - 7 x weekly - 3 sets - 10 reps - Supine Bridge  - 1 x daily - 7 x weekly - 3 sets - 10 reps - Supine Short Arc Quad  - 1 x daily - 7 x weekly - 3 sets - 10 reps - Bent Knee Fallouts  - 1 x daily - 7 x weekly - 3 sets - 10 reps - Small Range Straight Leg Raise  - 1 x daily - 7 x weekly - 3 sets - 10 reps - Supine Heel Slide  - 1 x daily - 7 x weekly - 3 sets - 10 reps  GOALS: Goals reviewed with patient? Yes  SHORT TERM GOALS: Target date: 05/10/2024  Patient will be independent in home exercise program to improve strength/mobility for better functional independence with ADLs. Baseline: to be provided on visit 2: 12/28/2023- No caregiver with patient to determine if compliant with HEP Goal  5/13: daugher states that they are completing some exercises almost daily; 04/09/2024= No caregiver present today to f/u with compliance of HEP- Patient verbalized yes and stated standing, moving my legs.  status: IN  PROGRESS  LONG TERM GOALS: Target date: 07/02/2024  2.  Patient (> 29 years old) will complete five times sit to stand test by 15 seconds and only min assist indicating an increased LE strength and improved balance. Baseline: 48 with mod assist from PT; 12/28/2023= 44 sec from EOM at lowest postion (approx 21 in) with Left UE support and CGA to min A from PT 5/13: 28.80 sec with HW from [facility]; min assist from PT for full erect standing; 04/09/2024= 26.56 sec with HW in front- pushing up using LUE on armrest of w/c- Min Assist for full erect standing 8/26: 25.41 sec with UE pushing from arm rest and reaching for rail on wall; min a to mod assist from PT throughout all repetitions for adequate anterior weight shift  Goal status: Not Met   3.  Patient will increase FIST score by > 6 points to demonstrate decreased fall risk during functional activities Baseline: 37 5/13: 46/56 Goal status: MET  4.  Patient will complete 10 meter walk test to with HW in less than 1 minute with min assist as to improve gait speed for better community ambulation and to reduce fall risk. Baseline: 6 ft with HW and mod assist; 12/28/2023=18 feet with HW- mod assist  5/13: 2:11 min with HW, min-mod assist. 04/09/2024= Patient ambulated approx 22 feet using HW with Min assist and mod VC for gait sequencing-then experienced LOB requiring Mod Assist to maintain standing. 8/26: 2:57min (0 .78m/s) with HW and min fading to mod assist to advance the RLE with fatigue (165 sec); 06/06/2024= 0.85 m/s sec avg with HM with CGA and close w/c follow. Goal status: PROGRESSING  5.  Patient will perform stand pivot transfer to and from [facility] with HW and CGA for safety to allow safe transfer to toilet with daughter without use of rail on the wall. Baseline: mod assist and heavy use of rail. 12/28/2023= Min to Mod assist with repetitive VC for technique 5/13: min assist overall with HW; mod assist initially both directions  to prevent posterior LOB.  04/09/2024- Min assist physically in both directions yet increased VC for safety- if provided more time- patient able to make correct movements 75% of time during transfers- still inconsistent with reaching back and pivoting on Right LE  8/26: mod assist from PT to the R and L on  this day due to posterior LOB  Goal status: Progressing  6.  Patient will improve bed mobility to min assist only to reduce care giver burden and improve safety of transfers  Baseline: mod-max assist 5/13: Min-mod assist for sit<>supine. Min assist only for RLE management for rolling; 04/09/2024- unable to test due to time  8/26. Mod assist for sit<>supine movement at trunk and RLE and min assist for rolling tasks on this day.  Goal status: ONGOING  ASSESSMENT:  CLINICAL IMPRESSION:  Treatment focused on gait and symmetry with gait today. Patient arrived well motivated and performed with much improved overall gait speed- almost 1 min faster with 10 MWT than previous test. She was focused and able to perform with close CGA and verbal cues but no physical assist to move her RLE today.  Pt will benefit from continued skilled PT to address strength and balance deficits to improve safety for transfers, reduce care giver burden, and allow return to PLOF and possible return home from Assisted living.   OBJECTIVE IMPAIRMENTS: Abnormal gait, cardiopulmonary status limiting activity, decreased activity tolerance, decreased balance, decreased cognition, decreased coordination, decreased endurance, decreased knowledge of use of DME, decreased mobility, difficulty walking, decreased ROM, decreased strength, hypomobility, increased muscle spasms, impaired flexibility, impaired sensation, impaired tone, impaired UE functional use, improper body mechanics, and postural dysfunction.   ACTIVITY LIMITATIONS: carrying, lifting, bending, standing, squatting, stairs, transfers, bed mobility, continence, bathing, toileting, dressing, reach over head,  hygiene/grooming, and locomotion level  PARTICIPATION LIMITATIONS: cleaning, interpersonal relationship, shopping, and community activity  PERSONAL FACTORS: 3+ comorbidities: HTN, chronic CVA, Afib, Lupus  are also affecting patient's functional outcome.   REHAB POTENTIAL: Fair chronic CVA with poor progress in prior interventions with this clinic  CLINICAL DECISION MAKING: Unstable/unpredictable  EVALUATION COMPLEXITY: High  PLAN:  PT FREQUENCY: 1-2x/week  PT DURATION: 12 weeks  PLANNED INTERVENTIONS: 97110-Therapeutic exercises, 97530- Therapeutic activity, [id number]- Neuromuscular re-education, 97535- Self Care, [id number]- Manual therapy, [phone]- Gait training, [phone]- Orthotic Fit/training, [phone]- Aquatic Therapy, [phone]- Splinting, [id number]- Electrical stimulation (unattended), [phone]- Electrical stimulation (manual), Patient/Family education, Balance training, Stair training, Joint mobilization, Vestibular training, Visual/preceptual remediation/compensation, Cognitive remediation, DME instructions, Wheelchair mobility training, Cryotherapy, and Moist heat  PLAN FOR NEXT SESSION:  Continue with functional standing Continue with transfer safety Continue with gait training.   Chyrl London, PT Physical Therapist - [facility]  9:43 PM 06/06/24   OUTPATIENT PHYSICAL THERAPY TREATMENT    Patient Name: Lindsey [name] MRN: [id number] DOB:1955/10/27, 68 y.o., female Today's Date: 06/06/2024  PCP: Lorel Maxie LABOR, MD  REFERRING PROVIDER: Lorilee Sven SQUIBB, MD   END OF SESSION:   PT End of Session - 06/06/24 0910     Visit Number 42    Number of Visits 55    Date for PT Re-Evaluation 07/02/24    Authorization Type Medicare/Medicaid    Progress Note Due on Visit 50    PT Start Time 0847    PT Stop Time 0928    PT Time Calculation (min) 41 min    Equipment Utilized During Treatment Gait belt    Activity Tolerance Patient tolerated treatment well;No  increased pain;Patient limited by fatigue    Behavior During Therapy [facility] for tasks assessed/performed                 Past Medical History:  Diagnosis Date   Aphasia    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 No pertinent surgical history. 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   REFERRING DIAG:  Diagnosis  I69.398,R25.2 (ICD-10-CM) - Spasticity as late effect of cerebrovascular accident (CVA)    THERAPY DIAG:  Muscle weakness (generalized)  Other lack of coordination  Unsteadiness on feet  Difficulty in walking, not elsewhere classified  Abnormality of gait and mobility  Other abnormalities of gait and mobility  Apraxia  Cognitive communication deficit  Rationale for Evaluation and Treatment: Rehabilitation  SUBJECTIVE:                                                                                                                                                                                              SUBJECTIVE STATEMENT: Patient reports no falls but states she is frustrated due to lack of help with standing/walking at facility.    PERTINENT HISTORY:  Daughter present for Evaluation to provide hx. Pt is familiar to this clinic and has been seen for multiple bouts PT since initial CVA in 2021. Pt d/c'ed from PT serviced in April of 2024 with plans to receive PT services through [facility]. Daughter reports that she was only receiving minimal therapy in facility. Reports at least 1 fall in the last 6 months and daughter asked staff to transfer pt with lift since fall.  Daughter would want pt to demonstrate improve safety with transfers to allow bil transfers with reduced use of rail to allow pt to return home for assisted living.    PAIN:  Are you having pain? No  PRECAUTIONS:  Fall  RED FLAGS: None   WEIGHT BEARING RESTRICTIONS: No  FALLS: Has patient fallen in last 6 months? Yes. Number of falls 1  LIVING ENVIRONMENT: Lives with: lives with their family and lives in a skilled nursing facility Lives in: Other SNF  Stairs: No Has following equipment at home: Hemi walker and Wheelchair (manual)  PLOF: Requires assistive device for independence, Needs assistance with ADLs, Needs assistance with homemaking, and WC level currently   PATIENT GOALS: move better - walking or transferring with hemi walking   OBJECTIVE:  Note: Objective measures were completed at Evaluation unless otherwise noted.  DIAGNOSTIC FINDINGS:  Head CT:  IMPRESSION: 1. No acute intracranial abnormality. 2. No acute displaced fracture or traumatic listhesis of the cervical spine.  Cervical CT:  MPRESSION: 1. No acute intracranial abnormality. 2. No acute displaced fracture or traumatic listhesis of the cervical spine.  COGNITION: Overall cognitive  status: History of cognitive impairments - at baseline   SENSATION: Light touch: Impaired  Proprioception: Impaired  Able to detect depe pressure   COORDINATION: Increased tone on the RLE   EDEMA:  Mild RLE distal edema   MUSCLE TONE: RLE: Mild and Moderate  MUSCLE LENGTH: Hamstrings: grossly limited   POSTURE: rounded shoulders, forward head, and posterior bias   LOWER EXTREMITY ROM:     Active  Right Eval Left Eval  Hip flexion  Limited to 5 deg beyond 90 in sitting  Knee flexion  90deg in sitting  Knee extension  Lacking 10 deg full extension  Ankle dorsiflexion  none  Ankle plantarflexion  none   (Blank rows = not tested)  LOWER EXTREMITY MMT:    MMT Right Eval Left Eval  Hip flexion  2+  Hip extension    Hip abduction  2  Hip adduction  3  Hip internal rotation    Hip external rotation    Knee flexion  2+  Knee extension  3+  Ankle dorsiflexion  0  Ankle plantarflexion  0  Ankle inversion    Ankle eversion    (Blank rows = not tested)  BED MOBILITY:  Sit to supine Mod A Supine to sit Mod A Rolling to Right Min A Rolling to Left Min A  TRANSFERS: Assistive device utilized: Hemi walker  Sit to stand: Mod A Stand to sit: Mod A Chair to chair: Mod A Floor: unable to perform   CURB:  Level of Assistance: Total A Assistive device utilized: rail Curb Comments: unable to perform   GAIT: Gait pattern: non-functional constant posterior bias , step to pattern, decreased stance time- Right, Right hip hike, lateral hip instability, and lateral lean- Left Distance walked: 56ft Assistive device utilized: Hemi walker Level of assistance: Mod A Comments: moderate cues for sequencing of HW and gait pattern in turns   FUNCTIONAL TESTS:  5 times sit to stand: 48 sec with mod Assist from PT  Timed up and go (TUG): unable to perform  10 meter walk test: 6 ft with min-mod assist and HW.  FIST:  TREATMENT DATE: 06/06/2024  TA: - Sit to stand - focusing on consistency with hand placement- Patient with increased difficulty scooting forward and continues to require some VC for safety and proper sequencing- did improve with practice today- Sit to stand from green chair in front of support bar- Only a few reminders to not reach for support bar and pull up -Static stand without UE support - x 5 min - VC/Reminders to stand tall and look ahead as well as intermittent cues to shift over to RLE  -Dynamic lateral weight shifting  while standing x 20 reps x 2.  -Feet staggered then some A/P weight shifting x 20 reps each direction -Forward/backward stepping at support bar with LUE support-  max difficulty swinging RLE back into hip ext and increased time and effort to swing forward today as well.  Step tapping onto 2 block with LUE Support- x 15 reps each- Max difficulty clearing RLE off floor and achieving adequate height to step tap due to extensor tone.    PATIENT EDUCATION: Education details: .  Pt educated throughout session about proper posture and technique with exercises. Improved exercise technique, movement at target joints, use of target muscles after min to mod verbal, visual, tactile cues Interpretation of outcome measure; need for continued progress to justify continued PT services.   Person educated: Patient Education method: Explanation Education comprehension: verbalized understanding  HOME EXERCISE PROGRAM: Access Code: [email or web address] URL: [email or web address] Date: 12/05/2023 Prepared by: Massie Dollar  Exercises - Sit to Stand with Counter Support  - 1 x daily - 3-4 x weekly - 2 sets - 10 reps - Seated Long Arc Quad  - 1 x daily - 3-4 x weekly - 2 sets - 10 reps - 2 hold - Seated March  - 1 x daily - 3-4 x weekly - 2 sets - 10 reps - 2 hold - Standing Hip Abduction with Counter Support  - 1 x daily  - 7 x weekly - 3 sets - 5 reps - Wide Stance with Counter Support  - 1 x daily - 7 x weekly - 3 sets - 2 reps - 30 hold - Seated Hip Abduction  - 1 x daily - 7 x weekly - 3 sets - 10 reps - Supine Bridge  - 1 x daily - 7 x weekly - 3 sets - 10 reps - Supine Short Arc Quad  - 1 x daily - 7 x weekly - 3 sets - 10 reps - Bent Knee Fallouts  - 1 x daily - 7 x weekly - 3 sets - 10 reps - Small Range Straight Leg Raise  - 1 x daily - 7 x weekly - 3 sets - 10 reps - Supine Heel Slide  - 1 x daily - 7 x weekly - 3 sets - 10 reps  GOALS: Goals reviewed with patient? Yes  SHORT TERM GOALS: Target date: 05/10/2024  Patient will be independent in home exercise program to improve strength/mobility for better functional independence with ADLs. Baseline: to be provided on visit 2: 12/28/2023- No caregiver with patient to determine if compliant with HEP Goal  5/13: daugher states that they are completing some exercises almost daily; 04/09/2024= No caregiver present today to f/u with compliance of HEP- Patient verbalized yes and stated standing, moving my legs.  status: IN PROGRESS  LONG TERM GOALS: Target date: 07/02/2024  2.  Patient (> 49 years old) will complete five times sit to stand test by 15 seconds and only min assist  indicating an increased LE strength and improved balance. Baseline: 48 with mod assist from PT; 12/28/2023= 44 sec from EOM at lowest postion (approx 21 in) with Left UE support and CGA to min A from PT 5/13: 28.80 sec with HW from [facility]; min assist from PT for full erect standing; 04/09/2024= 26.56 sec with HW in front- pushing up using LUE on armrest of w/c- Min Assist for full erect standing 8/26: 25.41 sec with UE pushing from arm rest and reaching for rail on wall; min a to mod assist from PT throughout all repetitions for adequate anterior weight shift  Goal status: Not Met   3.  Patient will increase FIST score by > 6 points to demonstrate decreased fall risk during functional  activities Baseline: 37 5/13: 46/56 Goal status: MET  4.  Patient will complete 10 meter walk test to with HW in less than 1 minute with min assist as to improve gait speed for better community ambulation and to reduce fall risk. Baseline: 6 ft with HW and mod assist; 12/28/2023=18 feet with HW- mod assist  5/13: 2:11 min with HW, min-mod assist. 04/09/2024= Patient ambulated approx 22 feet using HW with Min assist and mod VC for gait sequencing-then experienced LOB requiring Mod Assist to maintain standing. 8/26: 2:33min (0 .72m/s) with HW and min fading to mod assist to advance the RLE with fatigue (165 sec) Goal status: PROGRESSING  5.  Patient will perform stand pivot transfer to and from [facility] with HW and CGA for safety to allow safe transfer to toilet with daughter without use of rail on the wall. Baseline: mod assist and heavy use of rail. 12/28/2023= Min to Mod assist with repetitive VC for technique 5/13: min assist overall with HW; mod assist initially both directions  to prevent posterior LOB. 04/09/2024- Min assist physically in both directions yet increased VC for safety- if provided more time- patient able to make correct movements 75% of time during transfers- still inconsistent with reaching back and pivoting on Right LE  8/26: mod assist from PT to the R and L on this day due to posterior LOB  Goal status: Progressing  6.  Patient will improve bed mobility to min assist only to reduce care giver burden and improve safety of transfers  Baseline: mod-max assist 5/13: Min-mod assist for sit<>supine. Min assist only for RLE management for rolling; 04/09/2024- unable to test due to time  8/26. Mod assist for sit<>supine movement at trunk and RLE and min assist for rolling tasks on this day.  Goal status: ONGOING  ASSESSMENT:  CLINICAL IMPRESSION:  Patient appears very frustrated today with more difficulty negotiating/moving RLE. She was visibly upset and shaking her head more today.  Attempt to put her in more manageable positions like just dynamic weight shifting or static standing then back to more dynamic activities but her RLE was more sluggish and she had more difficulty moving her LE overall today. Will contact her Daughter to see how much help she can receive at the facility for carryover.  Pt will benefit from continued skilled PT to address strength and balance deficits to improve safety for transfers, reduce care giver burden, and allow return to PLOF and possible return home from Assisted living.   OBJECTIVE IMPAIRMENTS: Abnormal gait, cardiopulmonary status limiting activity, decreased activity tolerance, decreased balance, decreased cognition, decreased coordination, decreased endurance, decreased knowledge of use of DME, decreased mobility, difficulty walking, decreased ROM, decreased strength, hypomobility, increased muscle spasms, impaired flexibility, impaired sensation, impaired  tone, impaired UE functional use, improper body mechanics, and postural dysfunction.   ACTIVITY LIMITATIONS: carrying, lifting, bending, standing, squatting, stairs, transfers, bed mobility, continence, bathing, toileting, dressing, reach over head, hygiene/grooming, and locomotion level  PARTICIPATION LIMITATIONS: cleaning, interpersonal relationship, shopping, and community activity  PERSONAL FACTORS: 3+ comorbidities: HTN, chronic CVA, Afib, Lupus  are also affecting patient's functional outcome.   REHAB POTENTIAL: Fair chronic CVA with poor progress in prior interventions with this clinic  CLINICAL DECISION MAKING: Unstable/unpredictable  EVALUATION COMPLEXITY: High  PLAN:  PT FREQUENCY: 1-2x/week  PT DURATION: 12 weeks  PLANNED INTERVENTIONS: 97110-Therapeutic exercises, 97530- Therapeutic activity, [id number]- Neuromuscular re-education, 97535- Self Care, [id number]- Manual therapy, [phone]- Gait training, [phone]- Orthotic Fit/training, [phone]- Aquatic Therapy, [phone]- Splinting, [id number]-  Electrical stimulation (unattended), [phone]- Electrical stimulation (manual), Patient/Family education, Balance training, Stair training, Joint mobilization, Vestibular training, Visual/preceptual remediation/compensation, Cognitive remediation, DME instructions, Wheelchair mobility training, Cryotherapy, and Moist heat  PLAN FOR NEXT SESSION:  Continue with functional standing Continue with transfer safety Continue with gait training.   Chyrl London, PT Physical Therapist - [facility]  9:43 PM 06/06/24

## 2024-06-06 ENCOUNTER — Ambulatory Visit: Admitting: Occupational Therapy

## 2024-06-06 ENCOUNTER — Encounter: Admitting: Speech Pathology

## 2024-06-06 ENCOUNTER — Ambulatory Visit

## 2024-06-06 DIAGNOSIS — M6281 Muscle weakness (generalized): Secondary | ICD-10-CM

## 2024-06-06 DIAGNOSIS — R482 Apraxia: Secondary | ICD-10-CM

## 2024-06-06 DIAGNOSIS — R269 Unspecified abnormalities of gait and mobility: Secondary | ICD-10-CM

## 2024-06-06 DIAGNOSIS — R278 Other lack of coordination: Secondary | ICD-10-CM

## 2024-06-06 DIAGNOSIS — R262 Difficulty in walking, not elsewhere classified: Secondary | ICD-10-CM

## 2024-06-06 DIAGNOSIS — R41841 Cognitive communication deficit: Secondary | ICD-10-CM

## 2024-06-06 DIAGNOSIS — R2681 Unsteadiness on feet: Secondary | ICD-10-CM

## 2024-06-06 DIAGNOSIS — R2689 Other abnormalities of gait and mobility: Secondary | ICD-10-CM

## 2024-06-11 ENCOUNTER — Ambulatory Visit

## 2024-06-11 ENCOUNTER — Encounter: Attending: Physical Medicine and Rehabilitation | Admitting: Physical Medicine and Rehabilitation

## 2024-06-11 ENCOUNTER — Encounter: Admitting: Speech Pathology

## 2024-06-11 ENCOUNTER — Ambulatory Visit: Admitting: Occupational Therapy

## 2024-06-11 VITALS — BP 170/94 | HR 95

## 2024-06-11 DIAGNOSIS — I69398 Other sequelae of cerebral infarction: Secondary | ICD-10-CM | POA: Diagnosis present

## 2024-06-11 DIAGNOSIS — R262 Difficulty in walking, not elsewhere classified: Secondary | ICD-10-CM

## 2024-06-11 DIAGNOSIS — R482 Apraxia: Secondary | ICD-10-CM

## 2024-06-11 DIAGNOSIS — R2689 Other abnormalities of gait and mobility: Secondary | ICD-10-CM

## 2024-06-11 DIAGNOSIS — M6281 Muscle weakness (generalized): Secondary | ICD-10-CM | POA: Diagnosis not present

## 2024-06-11 DIAGNOSIS — R278 Other lack of coordination: Secondary | ICD-10-CM

## 2024-06-11 DIAGNOSIS — R2681 Unsteadiness on feet: Secondary | ICD-10-CM

## 2024-06-11 DIAGNOSIS — R269 Unspecified abnormalities of gait and mobility: Secondary | ICD-10-CM

## 2024-06-11 DIAGNOSIS — R41841 Cognitive communication deficit: Secondary | ICD-10-CM

## 2024-06-11 DIAGNOSIS — R252 Cramp and spasm: Secondary | ICD-10-CM | POA: Diagnosis present

## 2024-06-11 MED ORDER — TIZANIDINE HCL 4 MG PO TABS: 2.0000 mg | ORAL_TABLET | Freq: Every day | ORAL | 0 refills | Status: AC

## 2024-06-11 MED ORDER — TIZANIDINE HCL 4 MG PO TABS: 2.0000 mg | ORAL_TABLET | Freq: Every day | ORAL | 0 refills | Status: DC

## 2024-06-11 NOTE — Progress Notes (Signed)
 Botox  Injection for spasticity, palpation and activation approach, US  guidance used   Dilution: 1:1 Indication: Severe spasticity which interferes with ADL,mobility and/or  hygiene and is unresponsive to medication management and other conservative care   Informed consent was obtained after describing risks and benefits of the procedure with the patient. This includes bleeding, bruising, infection, excessive weakness, or medication side effects. A REMS form is on file and signed.  1) Flexor digitorum profundus: 50U in 1 site 2) Flexor carpi radialis: 50U U in 1 site 3) Flexor digitorum superficialis: 50U in 1 site 4) Flexor digitorum profundus: 50U 5) Pronator teres: 50U 6) Biceps: 50U     All injections were done after palpation and patient activation of the muscle confirmed the accurate location, and after negative drawback for blood. The patient tolerated the procedure well. Post procedure instructions were given. A followup appointment was made.

## 2024-06-11 NOTE — Therapy (Signed)
 OUTPATIENT PHYSICAL THERAPY TREATMENT    Patient Name: Lindsey [name] MRN: [id number] DOB:05-10-1956, 68 y.o., female Today's Date: 06/11/2024  PCP: Lorel Maxie LABOR, MD  REFERRING PROVIDER: Lorilee Sven SQUIBB, MD   END OF SESSION:   PT End of Session - 06/11/24 0859     Visit Number 43    Number of Visits 55    Date for PT Re-Evaluation 07/02/24    Authorization Type Medicare/Medicaid    Progress Note Due on Visit 50    PT Start Time [phone]    Equipment Utilized During Treatment Gait belt    Activity Tolerance Patient tolerated treatment well;No increased pain;Patient limited by fatigue    Behavior During Therapy [facility] for tasks assessed/performed                Past Medical History:  Diagnosis Date   Aphasia    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 No pertinent surgical history. 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   REFERRING DIAG:  Diagnosis  I69.398,R25.2 (ICD-10-CM) - Spasticity as late effect of cerebrovascular accident (CVA)    THERAPY DIAG:  Muscle weakness (generalized)  Other lack of coordination  Unsteadiness on feet  Difficulty in walking, not elsewhere classified  Abnormality of gait and mobility  Other abnormalities of gait and mobility  Apraxia  Rationale for Evaluation and Treatment: Rehabilitation  SUBJECTIVE:  SUBJECTIVE STATEMENT: Patient reports doing okay- denies any pain and when asked what is most concerning- patient responded I want to be able to walk.  PERTINENT HISTORY:  Daughter present for Evaluation to provide hx. Pt is familiar to this clinic and has been seen for multiple bouts PT since initial CVA in 2021. Pt d/c'ed from PT serviced in April of 2024 with plans to receive PT services through [facility]. Daughter reports that she was only receiving minimal therapy in facility. Reports at least 1 fall in the last 6 months and daughter asked staff to transfer pt with lift since fall.  Daughter would want pt to demonstrate improve safety with transfers to allow bil transfers with reduced use of rail to allow pt to return home for assisted living.    PAIN:  Are you having pain? No  PRECAUTIONS: Fall  RED FLAGS: None   WEIGHT BEARING RESTRICTIONS: No  FALLS: Has patient fallen in last 6 months? Yes. Number of falls 1  LIVING ENVIRONMENT: Lives with: lives with their family and lives in a skilled nursing facility Lives in: Other SNF  Stairs: No Has following equipment at home: Hemi walker and Wheelchair (manual)  PLOF: Requires assistive device for independence, Needs assistance with ADLs, Needs assistance with  homemaking, and WC level currently   PATIENT GOALS: move better - walking or transferring with hemi walking   OBJECTIVE:  Note: Objective measures were completed at Evaluation unless otherwise noted.  DIAGNOSTIC FINDINGS:  Head CT:  IMPRESSION: 1. No acute intracranial abnormality. 2. No acute displaced fracture or traumatic listhesis of the cervical spine.  Cervical CT:  MPRESSION: 1. No acute intracranial abnormality. 2. No acute displaced fracture or traumatic listhesis of the cervical spine.  COGNITION: Overall cognitive status: History of cognitive impairments - at baseline   SENSATION: Light touch: Impaired  Proprioception: Impaired  Able to detect depe pressure   COORDINATION: Increased tone on the RLE   EDEMA:  Mild RLE distal edema   MUSCLE TONE: RLE: Mild and Moderate  MUSCLE LENGTH: Hamstrings: grossly limited   POSTURE: rounded shoulders, forward head, and posterior bias   LOWER EXTREMITY ROM:     Active  Right Eval Left Eval  Hip flexion  Limited to 5 deg beyond 90 in sitting  Knee flexion  90deg in sitting  Knee extension  Lacking 10 deg full extension  Ankle dorsiflexion  none  Ankle plantarflexion  none   (Blank rows = not tested)  LOWER EXTREMITY MMT:    MMT Right Eval Left Eval  Hip flexion  2+  Hip extension    Hip abduction  2  Hip adduction  3  Hip internal rotation    Hip external rotation    Knee flexion  2+  Knee extension  3+  Ankle dorsiflexion  0  Ankle plantarflexion  0  Ankle inversion    Ankle eversion    (Blank rows = not tested)  BED MOBILITY:  Sit to supine Mod A Supine to sit Mod A Rolling to Right Min A Rolling to Left Min A  TRANSFERS: Assistive device utilized: Hemi walker  Sit to stand: Mod A Stand to sit: Mod A Chair to chair: Mod A Floor: unable to perform   CURB:  Level of Assistance: Total A Assistive device utilized: rail Curb Comments: unable to perform   GAIT: Gait pattern:  non-functional constant posterior bias , step to pattern, decreased stance time- Right, Right hip hike, lateral hip instability, and lateral  lean- Left Distance walked: 60ft Assistive device utilized: Hemi walker Level of assistance: Mod A Comments: moderate cues for sequencing of HW and gait pattern in turns   FUNCTIONAL TESTS:  5 times sit to stand: 48 sec with mod Assist from PT  Timed up and go (TUG): unable to perform  10 meter walk test: 6 ft with min-mod assist and HW.  FIST:                                                                                                                               TREATMENT DATE: 06/11/2024  TA: - Sit to stand - Continuing with focus on consistency with hand placement and ability to reach back with stand to sit. Patient continues to demonstrate inconsistency during visit x 8 reps total today.    TE:  PROM to R knee in sitting prior to walking- secondary to increase tightness  GAIT:  Patient ambulated approx 75 feet x 2 with HW, CGA with verbal cues for sequencing- mostly step to gait with occasional step through- Min VC for posture - slow overall cadence- yet no LOB.   10 Meter Walk Test: Patient instructed to walk 10 meters (32.8 ft) as quickly and as safely as possible at their normal speed x2 and at a fast speed x2. Time measured from 2 meter mark to 8 meter mark to accommodate ramp-up and ramp-down.  Normal speed 1: 0.08 m/s Normal speed 2: 0.09 m/s Average Normal speed: 0.085 m/s Cut off scores: <0.4 m/s = household Ambulator, 0.4-0.8 m/s = limited community Ambulator, >0.8 m/s = community Ambulator, >1.2 m/s = crossing a street, <1.0 = increased fall risk MCID 0.05 m/s (small), 0.13 m/s (moderate), 0.06 m/s (significant)  (ANPTA Core Set of Outcome Measures for Adults with Neurologic Conditions, 2018)   PATIENT EDUCATION: Education details: .  Pt educated throughout session about proper posture and technique with exercises. Improved  exercise technique, movement at target joints, use of target muscles after min to mod verbal, visual, tactile cues Interpretation of outcome measure; need for continued progress to justify continued PT services.   Person educated: Patient Education method: Explanation Education comprehension: verbalized understanding  HOME EXERCISE PROGRAM: Access Code: [email or web address] URL: [email or web address] Date: 12/05/2023 Prepared by: Massie Dollar  Exercises - Sit to Stand with Counter Support  - 1 x daily - 3-4 x weekly - 2 sets - 10 reps - Seated Long Arc Quad  - 1 x daily - 3-4 x weekly - 2 sets - 10 reps - 2 hold - Seated March  - 1 x daily - 3-4 x weekly - 2 sets - 10 reps - 2 hold - Standing Hip Abduction with Counter Support  - 1 x daily - 7 x weekly - 3 sets - 5 reps - Wide Stance with Counter Support  - 1 x daily - 7 x weekly - 3 sets - 2 reps - 30 hold - Seated Hip Abduction  -  1 x daily - 7 x weekly - 3 sets - 10 reps - Supine Bridge  - 1 x daily - 7 x weekly - 3 sets - 10 reps - Supine Short Arc Quad  - 1 x daily - 7 x weekly - 3 sets - 10 reps - Bent Knee Fallouts  - 1 x daily - 7 x weekly - 3 sets - 10 reps - Small Range Straight Leg Raise  - 1 x daily - 7 x weekly - 3 sets - 10 reps - Supine Heel Slide  - 1 x daily - 7 x weekly - 3 sets - 10 reps  GOALS: Goals reviewed with patient? Yes  SHORT TERM GOALS: Target date: 05/10/2024  Patient will be independent in home exercise program to improve strength/mobility for better functional independence with ADLs. Baseline: to be provided on visit 2: 12/28/2023- No caregiver with patient to determine if compliant with HEP Goal  5/13: daugher states that they are completing some exercises almost daily; 04/09/2024= No caregiver present today to f/u with compliance of HEP- Patient verbalized yes and stated standing, moving my legs.  status: IN PROGRESS  LONG TERM GOALS: Target date: 07/02/2024  2.  Patient (> 23 years old) will  complete five times sit to stand test by 15 seconds and only min assist indicating an increased LE strength and improved balance. Baseline: 48 with mod assist from PT; 12/28/2023= 44 sec from EOM at lowest postion (approx 21 in) with Left UE support and CGA to min A from PT 5/13: 28.80 sec with HW from [facility]; min assist from PT for full erect standing; 04/09/2024= 26.56 sec with HW in front- pushing up using LUE on armrest of w/c- Min Assist for full erect standing 8/26: 25.41 sec with UE pushing from arm rest and reaching for rail on wall; min a to mod assist from PT throughout all repetitions for adequate anterior weight shift  Goal status: Not Met   3.  Patient will increase FIST score by > 6 points to demonstrate decreased fall risk during functional activities Baseline: 37 5/13: 46/56 Goal status: MET  4.  Patient will complete 10 meter walk test to with HW in less than 1 minute with min assist as to improve gait speed for better community ambulation and to reduce fall risk. Baseline: 6 ft with HW and mod assist; 12/28/2023=18 feet with HW- mod assist  5/13: 2:11 min with HW, min-mod assist. 04/09/2024= Patient ambulated approx 22 feet using HW with Min assist and mod VC for gait sequencing-then experienced LOB requiring Mod Assist to maintain standing. 8/26: 2:42min (0 .33m/s) with HW and min fading to mod assist to advance the RLE with fatigue (165 sec); 06/06/2024= 0.85 m/s sec avg with HM with CGA and close w/c follow. Goal status: PROGRESSING  5.  Patient will perform stand pivot transfer to and from [facility] with HW and CGA for safety to allow safe transfer to toilet with daughter without use of rail on the wall. Baseline: mod assist and heavy use of rail. 12/28/2023= Min to Mod assist with repetitive VC for technique 5/13: min assist overall with HW; mod assist initially both directions  to prevent posterior LOB. 04/09/2024- Min assist physically in both directions yet increased VC for safety- if provided  more time- patient able to make correct movements 75% of time during transfers- still inconsistent with reaching back and pivoting on Right LE  8/26: mod assist from PT to the R and L on  this day due to posterior LOB  Goal status: Progressing  6.  Patient will improve bed mobility to min assist only to reduce care giver burden and improve safety of transfers  Baseline: mod-max assist 5/13: Min-mod assist for sit<>supine. Min assist only for RLE management for rolling; 04/09/2024- unable to test due to time  8/26. Mod assist for sit<>supine movement at trunk and RLE and min assist for rolling tasks on this day.  Goal status: ONGOING  ASSESSMENT:  CLINICAL IMPRESSION:  Treatment focused on gait and symmetry with gait today. Patient arrived well motivated and performed with much improved overall gait speed- almost 1 min faster with 10 MWT than previous test. She was focused and able to perform with close CGA and verbal cues but no physical assist to move her RLE today.  Pt will benefit from continued skilled PT to address strength and balance deficits to improve safety for transfers, reduce care giver burden, and allow return to PLOF and possible return home from Assisted living.   OBJECTIVE IMPAIRMENTS: Abnormal gait, cardiopulmonary status limiting activity, decreased activity tolerance, decreased balance, decreased cognition, decreased coordination, decreased endurance, decreased knowledge of use of DME, decreased mobility, difficulty walking, decreased ROM, decreased strength, hypomobility, increased muscle spasms, impaired flexibility, impaired sensation, impaired tone, impaired UE functional use, improper body mechanics, and postural dysfunction.   ACTIVITY LIMITATIONS: carrying, lifting, bending, standing, squatting, stairs, transfers, bed mobility, continence, bathing, toileting, dressing, reach over head, hygiene/grooming, and locomotion level  PARTICIPATION LIMITATIONS: cleaning,  interpersonal relationship, shopping, and community activity  PERSONAL FACTORS: 3+ comorbidities: HTN, chronic CVA, Afib, Lupus  are also affecting patient's functional outcome.   REHAB POTENTIAL: Fair chronic CVA with poor progress in prior interventions with this clinic  CLINICAL DECISION MAKING: Unstable/unpredictable  EVALUATION COMPLEXITY: High  PLAN:  PT FREQUENCY: 1-2x/week  PT DURATION: 12 weeks  PLANNED INTERVENTIONS: 97110-Therapeutic exercises, 97530- Therapeutic activity, [id number]- Neuromuscular re-education, 97535- Self Care, [id number]- Manual therapy, [phone]- Gait training, [phone]- Orthotic Fit/training, [phone]- Aquatic Therapy, [phone]- Splinting, [id number]- Electrical stimulation (unattended), [phone]- Electrical stimulation (manual), Patient/Family education, Balance training, Stair training, Joint mobilization, Vestibular training, Visual/preceptual remediation/compensation, Cognitive remediation, DME instructions, Wheelchair mobility training, Cryotherapy, and Moist heat  PLAN FOR NEXT SESSION:  Continue with functional standing Continue with transfer safety Continue with gait training.   Chyrl London, PT Physical Therapist - [place]  9:07 AM 06/11/24   OUTPATIENT PHYSICAL THERAPY TREATMENT    Patient Name: Lindsey [name] MRN: [id number] DOB:1955-12-29, 68 y.o., female Today's Date: 06/11/2024  PCP: Lorel Maxie LABOR, MD  REFERRING PROVIDER: Lorilee Sven SQUIBB, MD   END OF SESSION:   PT End of Session - 06/11/24 0859     Visit Number 43    Number of Visits 55    Date for PT Re-Evaluation 07/02/24    Authorization Type Medicare/Medicaid    Progress Note Due on Visit 50    PT Start Time [phone]    Equipment Utilized During Treatment Gait belt    Activity Tolerance Patient tolerated treatment well;No increased pain;Patient limited by fatigue    Behavior During Therapy [facility] for tasks assessed/performed                 Past  Medical History:  Diagnosis Date   Aphasia    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 No pertinent surgical history. 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   REFERRING DIAG:  Diagnosis  I69.398,R25.2 (ICD-10-CM) - Spasticity as late effect of cerebrovascular accident (CVA)    THERAPY DIAG:  Muscle weakness (generalized)  Other lack of coordination  Unsteadiness on feet  Difficulty in walking, not elsewhere classified  Abnormality of gait and mobility  Other abnormalities of gait and mobility  Apraxia  Rationale for Evaluation and Treatment: Rehabilitation  SUBJECTIVE:                                                                                                                                                                                             SUBJECTIVE  STATEMENT: Patient reports no falls but states she is frustrated due to lack of help with standing/walking at facility.    PERTINENT HISTORY:  Daughter present for Evaluation to provide hx. Pt is familiar to this clinic and has been seen for multiple bouts PT since initial CVA in 2021. Pt d/c'ed from PT serviced in April of 2024 with plans to receive PT services through [facility]. Daughter reports that she was only receiving minimal therapy in facility. Reports at least 1 fall in the last 6 months and daughter asked staff to transfer pt with lift since fall.  Daughter would want pt to demonstrate improve safety with transfers to allow bil transfers with reduced use of rail to allow pt to return home for assisted living.    PAIN:  Are you having pain? No  PRECAUTIONS: Fall  RED FLAGS: None   WEIGHT BEARING RESTRICTIONS: No  FALLS: Has patient fallen in last 6 months?  Yes. Number of falls 1  LIVING ENVIRONMENT: Lives with: lives with their family and lives in a skilled nursing facility Lives in: Other SNF  Stairs: No Has following equipment at home: Hemi walker and Wheelchair (manual)  PLOF: Requires assistive device for independence, Needs assistance with ADLs, Needs assistance with homemaking, and WC level currently   PATIENT GOALS: move better - walking or transferring with hemi walking   OBJECTIVE:  Note: Objective measures were completed at Evaluation unless otherwise noted.  DIAGNOSTIC FINDINGS:  Head CT:  IMPRESSION: 1. No acute intracranial abnormality. 2. No acute displaced fracture or traumatic listhesis of the cervical spine.  Cervical CT:  MPRESSION: 1. No acute intracranial abnormality. 2. No acute displaced fracture or traumatic listhesis of the cervical spine.  COGNITION: Overall cognitive status: History of cognitive impairments - at baseline   SENSATION: Light touch: Impaired  Proprioception: Impaired  Able to detect depe pressure    COORDINATION: Increased tone on the RLE   EDEMA:  Mild RLE distal edema   MUSCLE TONE: RLE: Mild and Moderate  MUSCLE LENGTH: Hamstrings: grossly limited   POSTURE: rounded shoulders, forward head, and posterior bias   LOWER EXTREMITY ROM:     Active  Right Eval Left Eval  Hip flexion  Limited to 5 deg beyond 90 in sitting  Knee flexion  90deg in sitting  Knee extension  Lacking 10 deg full extension  Ankle dorsiflexion  none  Ankle plantarflexion  none   (Blank rows = not tested)  LOWER EXTREMITY MMT:    MMT Right Eval Left Eval  Hip flexion  2+  Hip extension    Hip abduction  2  Hip adduction  3  Hip internal rotation    Hip external rotation    Knee flexion  2+  Knee extension  3+  Ankle dorsiflexion  0  Ankle plantarflexion  0  Ankle inversion    Ankle eversion    (Blank rows = not tested)  BED MOBILITY:  Sit to supine Mod A Supine to sit Mod A Rolling to Right Min A Rolling to Left Min A  TRANSFERS: Assistive device utilized: Hemi walker  Sit to stand: Mod A Stand to sit: Mod A Chair to chair: Mod A Floor: unable to perform   CURB:  Level of Assistance: Total A Assistive device utilized: rail Curb Comments: unable to perform   GAIT: Gait pattern: non-functional constant posterior bias , step to pattern, decreased stance time- Right, Right hip hike, lateral hip instability, and lateral lean- Left Distance walked: 34ft Assistive device utilized: Hemi walker Level of assistance: Mod A Comments: moderate cues for sequencing of HW and gait pattern in turns   FUNCTIONAL TESTS:  5 times sit to stand: 48 sec with mod Assist from PT  Timed up and go (TUG): unable to perform  10 meter walk test: 6 ft with min-mod assist and HW.  FIST:  TREATMENT DATE: 06/11/2024  TA: - Sit to stand - focusing on consistency with hand  placement- Min VC for technique early but patient able to perform 15 reps (slowly) focusing on anterior weight shift -Dynamic lateral weight shifting  while standing x 20 reps x 2.  -Feet staggered then some A/P weight shifting x 20 reps each direction -Static stand without UE support - x 5 min - VC/Reminders to stand tall and look ahead as well as intermittent cues to shift over to RLE   -Forward/backward stepping at support bar with LUE support-  max difficulty swinging RLE back into hip ext and increased time and effort to swing forward today as well.  Step tapping onto 2 block with LUE Support- x 15 reps each- Max difficulty clearing RLE off floor and achieving adequate height to step tap due to extensor tone.    PATIENT EDUCATION: Education details: .  Pt educated throughout session about proper posture and technique with exercises. Improved exercise technique, movement at target joints, use of target muscles after min to mod verbal, visual, tactile cues Interpretation of outcome measure; need for continued progress to justify continued PT services.   Person educated: Patient Education method: Explanation Education comprehension: verbalized understanding  HOME EXERCISE PROGRAM: Access Code: [email or web address] URL: [email or web address] Date: 12/05/2023 Prepared by: Massie Dollar  Exercises - Sit to Stand with Counter Support  - 1 x daily - 3-4 x weekly - 2 sets - 10 reps - Seated Long Arc Quad  - 1 x daily - 3-4 x weekly - 2 sets - 10 reps - 2 hold - Seated March  - 1 x daily - 3-4 x weekly - 2 sets - 10 reps - 2 hold - Standing Hip Abduction with Counter Support  - 1 x daily - 7 x weekly - 3 sets - 5 reps - Wide Stance with Counter Support  - 1 x daily - 7 x weekly - 3 sets - 2 reps - 30 hold - Seated Hip Abduction  - 1 x daily - 7 x weekly - 3 sets - 10 reps - Supine Bridge  - 1 x daily - 7 x weekly - 3 sets - 10 reps - Supine Short Arc Quad  - 1 x daily - 7 x weekly - 3  sets - 10 reps - Bent Knee Fallouts  - 1 x daily - 7 x weekly - 3 sets - 10 reps - Small Range Straight Leg Raise  - 1 x daily - 7 x weekly - 3 sets - 10 reps - Supine Heel Slide  - 1 x daily - 7 x weekly - 3 sets - 10 reps  GOALS: Goals reviewed with patient? Yes  SHORT TERM GOALS: Target date: 05/10/2024  Patient will be independent in home exercise program to improve strength/mobility for better functional independence with ADLs. Baseline: to be provided on visit 2: 12/28/2023- No caregiver with patient to determine if compliant with HEP Goal  5/13: daugher states that they are completing some exercises almost daily; 04/09/2024= No caregiver present today to f/u with compliance of HEP- Patient verbalized yes and stated standing, moving my legs.  status: IN PROGRESS  LONG TERM GOALS: Target date: 07/02/2024  2.  Patient (> 81 years old) will complete five times sit to stand test by 15 seconds and only min assist indicating an increased LE strength and improved balance. Baseline: 48 with mod assist from PT; 12/28/2023= 44 sec from EOM at lowest postion (approx 21 in)  with Left UE support and CGA to min A from PT 5/13: 28.80 sec with HW from [facility]; min assist from PT for full erect standing; 04/09/2024= 26.56 sec with HW in front- pushing up using LUE on armrest of w/c- Min Assist for full erect standing 8/26: 25.41 sec with UE pushing from arm rest and reaching for rail on wall; min a to mod assist from PT throughout all repetitions for adequate anterior weight shift  Goal status: Not Met   3.  Patient will increase FIST score by > 6 points to demonstrate decreased fall risk during functional activities Baseline: 37 5/13: 46/56 Goal status: MET  4.  Patient will complete 10 meter walk test to with HW in less than 1 minute with min assist as to improve gait speed for better community ambulation and to reduce fall risk. Baseline: 6 ft with HW and mod assist; 12/28/2023=18 feet with HW- mod assist   5/13: 2:11 min with HW, min-mod assist. 04/09/2024= Patient ambulated approx 22 feet using HW with Min assist and mod VC for gait sequencing-then experienced LOB requiring Mod Assist to maintain standing. 8/26: 2:37min (0 .81m/s) with HW and min fading to mod assist to advance the RLE with fatigue (165 sec) Goal status: PROGRESSING  5.  Patient will perform stand pivot transfer to and from [employer] with HW and CGA for safety to allow safe transfer to toilet with daughter without use of rail on the wall. Baseline: mod assist and heavy use of rail. 12/28/2023= Min to Mod assist with repetitive VC for technique 5/13: min assist overall with HW; mod assist initially both directions  to prevent posterior LOB. 04/09/2024- Min assist physically in both directions yet increased VC for safety- if provided more time- patient able to make correct movements 75% of time during transfers- still inconsistent with reaching back and pivoting on Right LE  8/26: mod assist from PT to the R and L on this day due to posterior LOB  Goal status: Progressing  6.  Patient will improve bed mobility to min assist only to reduce care giver burden and improve safety of transfers  Baseline: mod-max assist 5/13: Min-mod assist for sit<>supine. Min assist only for RLE management for rolling; 04/09/2024- unable to test due to time  8/26. Mod assist for sit<>supine movement at trunk and RLE and min assist for rolling tasks on this day.  Goal status: ONGOING  ASSESSMENT:  CLINICAL IMPRESSION:  Patient appears very frustrated today with more difficulty negotiating/moving RLE. She was visibly upset and shaking her head more today. Attempt to put her in more manageable positions like just dynamic weight shifting or static standing then back to more dynamic activities but her RLE was more sluggish and she had more difficulty moving her LE overall today. Will contact her Daughter to see how much help she can receive at the facility for carryover.   Pt will benefit from continued skilled PT to address strength and balance deficits to improve safety for transfers, reduce care giver burden, and allow return to PLOF and possible return home from Assisted living.   OBJECTIVE IMPAIRMENTS: Abnormal gait, cardiopulmonary status limiting activity, decreased activity tolerance, decreased balance, decreased cognition, decreased coordination, decreased endurance, decreased knowledge of use of DME, decreased mobility, difficulty walking, decreased ROM, decreased strength, hypomobility, increased muscle spasms, impaired flexibility, impaired sensation, impaired tone, impaired UE functional use, improper body mechanics, and postural dysfunction.   ACTIVITY LIMITATIONS: carrying, lifting, bending, standing, squatting, stairs, transfers, bed mobility, continence, bathing,  toileting, dressing, reach over head, hygiene/grooming, and locomotion level  PARTICIPATION LIMITATIONS: cleaning, interpersonal relationship, shopping, and community activity  PERSONAL FACTORS: 3+ comorbidities: HTN, chronic CVA, Afib, Lupus  are also affecting patient's functional outcome.   REHAB POTENTIAL: Fair chronic CVA with poor progress in prior interventions with this clinic  CLINICAL DECISION MAKING: Unstable/unpredictable  EVALUATION COMPLEXITY: High  PLAN:  PT FREQUENCY: 1-2x/week  PT DURATION: 12 weeks  PLANNED INTERVENTIONS: 97110-Therapeutic exercises, 97530- Therapeutic activity, [id number]- Neuromuscular re-education, 97535- Self Care, [id number]- Manual therapy, [phone]- Gait training, [phone]- Orthotic Fit/training, [phone]- Aquatic Therapy, [phone]- Splinting, [id number]- Electrical stimulation (unattended), [phone]- Electrical stimulation (manual), Patient/Family education, Balance training, Stair training, Joint mobilization, Vestibular training, Visual/preceptual remediation/compensation, Cognitive remediation, DME instructions, Wheelchair mobility training, Cryotherapy, and Moist  heat  PLAN FOR NEXT SESSION:  Continue with functional standing Continue with transfer safety Continue with gait training.   Chyrl London, PT Physical Therapist - [facility]  9:07 AM 06/11/24

## 2024-06-13 ENCOUNTER — Ambulatory Visit

## 2024-06-13 ENCOUNTER — Encounter: Admitting: Speech Pathology

## 2024-06-13 ENCOUNTER — Ambulatory Visit: Admitting: Occupational Therapy

## 2024-06-13 DIAGNOSIS — M6281 Muscle weakness (generalized): Secondary | ICD-10-CM | POA: Diagnosis not present

## 2024-06-13 DIAGNOSIS — R482 Apraxia: Secondary | ICD-10-CM

## 2024-06-13 DIAGNOSIS — R2689 Other abnormalities of gait and mobility: Secondary | ICD-10-CM

## 2024-06-13 DIAGNOSIS — R269 Unspecified abnormalities of gait and mobility: Secondary | ICD-10-CM

## 2024-06-13 DIAGNOSIS — R2681 Unsteadiness on feet: Secondary | ICD-10-CM

## 2024-06-13 DIAGNOSIS — R278 Other lack of coordination: Secondary | ICD-10-CM

## 2024-06-13 DIAGNOSIS — R262 Difficulty in walking, not elsewhere classified: Secondary | ICD-10-CM

## 2024-06-13 NOTE — Therapy (Signed)
 OUTPATIENT PHYSICAL THERAPY TREATMENT    Patient Name: Lindsey [name] MRN: [id number] DOB:[birth year], 68 y.o., female Today's Date: 06/13/2024  PCP: Lorel Maxie LABOR, MD  REFERRING PROVIDER: Lorilee Sven SQUIBB, MD   END OF SESSION:   PT End of Session - 06/13/24 0855     Visit Number 44    Number of Visits 55    Date for PT Re-Evaluation 07/02/24    Authorization Type Medicare/Medicaid    Progress Note Due on Visit 50    PT Start Time 0848    PT Stop Time 0928    PT Time Calculation (min) 40 min    Equipment Utilized During Treatment Gait belt    Activity Tolerance Patient tolerated treatment well;No increased pain;Patient limited by fatigue    Behavior During Therapy [facility] for tasks assessed/performed                Past Medical History:  Diagnosis Date   Aphasia    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 No pertinent surgical history. 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   REFERRING DIAG:  Diagnosis  I69.398,R25.2 (ICD-10-CM) - Spasticity as late effect of cerebrovascular accident (CVA)    THERAPY DIAG:  Muscle weakness (generalized)  Other lack of coordination  Unsteadiness on feet  Difficulty in walking, not elsewhere classified  Abnormality of gait and mobility  Other abnormalities of gait and mobility  Apraxia  Rationale for Evaluation and Treatment: Rehabilitation  SUBJECTIVE:  SUBJECTIVE STATEMENT: Patient reports no new issues. States ready to work. States right LE is tight  PERTINENT HISTORY:  Daughter present for Evaluation to provide hx. Pt is familiar to this clinic and has been seen for multiple bouts PT since initial CVA in 2021. Pt d/c'ed from PT serviced in April of 2024 with plans to receive PT services through [facility]. Daughter reports that she was only receiving minimal therapy in facility. Reports at least 1 fall in the last 6 months and daughter asked staff to transfer pt with lift since fall.  Daughter would want pt to demonstrate improve safety with transfers to allow bil transfers with reduced use of rail to allow pt to return home for assisted living.    PAIN:  Are you having pain? No  PRECAUTIONS: Fall  RED FLAGS: None   WEIGHT BEARING RESTRICTIONS: No  FALLS: Has patient fallen in last 6 months? Yes. Number of falls 1  LIVING ENVIRONMENT: Lives with: lives with their family and lives in a skilled nursing facility Lives in: Other SNF  Stairs: No Has following equipment at home: Hemi walker and Wheelchair (manual)  PLOF: Requires assistive device for independence, Needs assistance with ADLs, Needs assistance  with homemaking, and WC level currently   PATIENT GOALS: move better - walking or transferring with hemi walking   OBJECTIVE:  Note: Objective measures were completed at Evaluation unless otherwise noted.  DIAGNOSTIC FINDINGS:  Head CT:  IMPRESSION: 1. No acute intracranial abnormality. 2. No acute displaced fracture or traumatic listhesis of the cervical spine.  Cervical CT:  MPRESSION: 1. No acute intracranial abnormality. 2. No acute displaced fracture or traumatic listhesis of the cervical spine.  COGNITION: Overall cognitive status: History of cognitive impairments - at baseline   SENSATION: Light touch: Impaired  Proprioception: Impaired  Able to detect depe pressure   COORDINATION: Increased tone on the RLE   EDEMA:  Mild RLE distal edema   MUSCLE TONE: RLE: Mild and Moderate  MUSCLE LENGTH: Hamstrings: grossly limited   POSTURE: rounded shoulders, forward head, and posterior bias   LOWER EXTREMITY ROM:     Active  Right Eval Left Eval  Hip flexion  Limited to 5 deg beyond 90 in sitting  Knee flexion  90deg in sitting  Knee extension  Lacking 10 deg full extension  Ankle dorsiflexion  none  Ankle plantarflexion  none   (Blank rows = not tested)  LOWER EXTREMITY MMT:    MMT Right Eval Left Eval  Hip flexion  2+  Hip extension    Hip abduction  2  Hip adduction  3  Hip internal rotation    Hip external rotation    Knee flexion  2+  Knee extension  3+  Ankle dorsiflexion  0  Ankle plantarflexion  0  Ankle inversion    Ankle eversion    (Blank rows = not tested)  BED MOBILITY:  Sit to supine Mod A Supine to sit Mod A Rolling to Right Min A Rolling to Left Min A  TRANSFERS: Assistive device utilized: Hemi walker  Sit to stand: Mod A Stand to sit: Mod A Chair to chair: Mod A Floor: unable to perform   CURB:  Level of Assistance: Total A Assistive device utilized: rail Curb Comments: unable to perform   GAIT: Gait pattern:  non-functional constant posterior bias , step to pattern, decreased stance time- Right, Right hip hike, lateral hip instability, and lateral lean- Left Distance walked: 64ft Assistive device utilized: Hemi  walker Level of assistance: Mod A Comments: moderate cues for sequencing of HW and gait pattern in turns   FUNCTIONAL TESTS:  5 times sit to stand: 48 sec with mod Assist from PT  Timed up and go (TUG): unable to perform  10 meter walk test: 6 ft with min-mod assist and HW.  FIST:                                                                                                                               TREATMENT DATE: 06/13/2024  TA: - Sit to stand - Continuing with focus on consistency with hand placement and ability to reach back with stand to sit x 10 reps - only physical assist today without VC for hand position  -Patient had issue with pants not being buttoned and super tight- PT able to adjust and fasten with some effort and patient able to static stand holding onto hemi-walker  -Dynamic walking- 6 feet fwd and 6 feet bwd using hemi walker- focusing on safe and effective stepping x 6 trials   TE:  PROM to R knee in sitting prior to walking- secondary to increase tightness x 20 reps  AAROM to R knee in sitting x 20 reps. Patient able to achieve approx 108 deg with assistance.           PATIENT EDUCATION: Education details: .  Pt educated throughout session about proper posture and technique with exercises. Improved exercise technique, movement at target joints, use of target muscles after min to mod verbal, visual, tactile cues Interpretation of outcome measure; need for continued progress to justify continued PT services.   Person educated: Patient Education method: Explanation Education comprehension: verbalized understanding  HOME EXERCISE PROGRAM: Access Code: [email or web address] URL: [email or web address] Date: 12/05/2023 Prepared by: Massie Dollar  Exercises - Sit to Stand with Counter Support  - 1 x daily - 3-4 x weekly - 2 sets - 10 reps - Seated Long Arc Quad  - 1 x daily - 3-4 x weekly - 2 sets - 10 reps - 2 hold - Seated March  - 1 x daily - 3-4 x weekly - 2 sets - 10 reps - 2 hold - Standing Hip Abduction with Counter Support  - 1 x daily - 7 x weekly - 3 sets - 5 reps - Wide Stance with Counter Support  - 1 x daily - 7 x weekly - 3 sets - 2 reps - 30 hold - Seated Hip Abduction  - 1 x daily - 7 x weekly - 3 sets - 10 reps - Supine Bridge  - 1 x daily - 7 x weekly - 3 sets - 10 reps - Supine Short Arc Quad  - 1 x daily - 7 x weekly - 3 sets - 10 reps - Bent Knee Fallouts  - 1 x daily - 7 x weekly - 3 sets - 10 reps - Small Range Straight Leg Raise  -  1 x daily - 7 x weekly - 3 sets - 10 reps - Supine Heel Slide  - 1 x daily - 7 x weekly - 3 sets - 10 reps  GOALS: Goals reviewed with patient? Yes  SHORT TERM GOALS: Target date: 05/10/2024  Patient will be independent in home exercise program to improve strength/mobility for better functional independence with ADLs. Baseline: to be provided on visit 2: 12/28/2023- No caregiver with patient to determine if compliant with HEP Goal  5/13: daugher states that they are completing some exercises almost daily; 04/09/2024= No caregiver present today to f/u with compliance of HEP- Patient verbalized yes and stated standing, moving my legs.  status: IN PROGRESS  LONG TERM GOALS: Target date: 07/02/2024  2.  Patient (> 36 years old) will complete five times sit to stand test by 15 seconds and only min assist indicating an increased LE strength and improved balance. Baseline: 48 with mod assist from PT; 12/28/2023= 44 sec from EOM at lowest postion (approx 21 in) with Left UE support and CGA to min A from PT 5/13: 28.80 sec with HW from [facility]; min assist from PT for full erect standing; 04/09/2024= 26.56 sec with HW in front- pushing up using LUE on armrest of w/c- Min Assist for full erect  standing 8/26: 25.41 sec with UE pushing from arm rest and reaching for rail on wall; min a to mod assist from PT throughout all repetitions for adequate anterior weight shift  Goal status: Not Met   3.  Patient will increase FIST score by > 6 points to demonstrate decreased fall risk during functional activities Baseline: 37 5/13: 46/56 Goal status: MET  4.  Patient will complete 10 meter walk test to with HW in less than 1 minute with min assist as to improve gait speed for better community ambulation and to reduce fall risk. Baseline: 6 ft with HW and mod assist; 12/28/2023=18 feet with HW- mod assist  5/13: 2:11 min with HW, min-mod assist. 04/09/2024= Patient ambulated approx 22 feet using HW with Min assist and mod VC for gait sequencing-then experienced LOB requiring Mod Assist to maintain standing. 8/26: 2:31min (0 .17m/s) with HW and min fading to mod assist to advance the RLE with fatigue (165 sec); 06/06/2024= 0.85 m/s sec avg with HM with CGA and close w/c follow. Goal status: PROGRESSING  5.  Patient will perform stand pivot transfer to and from [facility] with HW and CGA for safety to allow safe transfer to toilet with daughter without use of rail on the wall. Baseline: mod assist and heavy use of rail. 12/28/2023= Min to Mod assist with repetitive VC for technique 5/13: min assist overall with HW; mod assist initially both directions  to prevent posterior LOB. 04/09/2024- Min assist physically in both directions yet increased VC for safety- if provided more time- patient able to make correct movements 75% of time during transfers- still inconsistent with reaching back and pivoting on Right LE  8/26: mod assist from PT to the R and L on this day due to posterior LOB  Goal status: Progressing  6.  Patient will improve bed mobility to min assist only to reduce care giver burden and improve safety of transfers  Baseline: mod-max assist 5/13: Min-mod assist for sit<>supine. Min assist only for RLE  management for rolling; 04/09/2024- unable to test due to time  8/26. Mod assist for sit<>supine movement at trunk and RLE and min assist for rolling tasks on this day.  Goal  status: ONGOING  ASSESSMENT:  CLINICAL IMPRESSION:  Patient continues to present with more consistent sit to stand transfers this week. She was mostly challenged with retro gait - focusing on safety with all mobility. She did require significant VC for gait sequencing today as she struggled to be consistent in technique and easily frustrated- shaking her head several times during visit. Overall still very much increased risk of falling and patient really needs support in home setting for max rehab potential. Pt will benefit from continued skilled PT to address strength and balance deficits to improve safety for transfers, reduce care giver burden, and allow return to PLOF and possible return home from Assisted living.   OBJECTIVE IMPAIRMENTS: Abnormal gait, cardiopulmonary status limiting activity, decreased activity tolerance, decreased balance, decreased cognition, decreased coordination, decreased endurance, decreased knowledge of use of DME, decreased mobility, difficulty walking, decreased ROM, decreased strength, hypomobility, increased muscle spasms, impaired flexibility, impaired sensation, impaired tone, impaired UE functional use, improper body mechanics, and postural dysfunction.   ACTIVITY LIMITATIONS: carrying, lifting, bending, standing, squatting, stairs, transfers, bed mobility, continence, bathing, toileting, dressing, reach over head, hygiene/grooming, and locomotion level  PARTICIPATION LIMITATIONS: cleaning, interpersonal relationship, shopping, and community activity  PERSONAL FACTORS: 3+ comorbidities: HTN, chronic CVA, Afib, Lupus  are also affecting patient's functional outcome.   REHAB POTENTIAL: Fair chronic CVA with poor progress in prior interventions with this clinic  CLINICAL DECISION MAKING:  Unstable/unpredictable  EVALUATION COMPLEXITY: High  PLAN:  PT FREQUENCY: 1-2x/week  PT DURATION: 12 weeks  PLANNED INTERVENTIONS: 97110-Therapeutic exercises, 97530- Therapeutic activity, [id number]- Neuromuscular re-education, 97535- Self Care, [id number]- Manual therapy, [phone]- Gait training, [phone]- Orthotic Fit/training, [phone]- Aquatic Therapy, [phone]- Splinting, [id number]- Electrical stimulation (unattended), [phone]- Electrical stimulation (manual), Patient/Family education, Balance training, Stair training, Joint mobilization, Vestibular training, Visual/preceptual remediation/compensation, Cognitive remediation, DME instructions, Wheelchair mobility training, Cryotherapy, and Moist heat  PLAN FOR NEXT SESSION:  Continue with functional standing Continue with transfer safety Continue with gait training.   Chyrl London, PT Physical Therapist - [facility]  9:42 PM 06/13/24   OUTPATIENT PHYSICAL THERAPY TREATMENT    Patient Name: Lindsey [name] MRN: [id number] DOB:05/14/1956, 68 y.o., female Today's Date: 06/13/2024  PCP: Lorel Maxie LABOR, MD  REFERRING PROVIDER: Lorilee Sven SQUIBB, MD   END OF SESSION:   PT End of Session - 06/13/24 0855     Visit Number 44    Number of Visits 55    Date for PT Re-Evaluation 07/02/24    Authorization Type Medicare/Medicaid    Progress Note Due on Visit 50    PT Start Time 0848    PT Stop Time 0928    PT Time Calculation (min) 40 min    Equipment Utilized During Treatment Gait belt    Activity Tolerance Patient tolerated treatment well;No increased pain;Patient limited by fatigue    Behavior During Therapy [facility] for tasks assessed/performed                 Past Medical History:  Diagnosis Date   Aphasia    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 No pertinent surgical history. 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   REFERRING DIAG:  Diagnosis  I69.398,R25.2 (ICD-10-CM) - Spasticity as late effect of cerebrovascular accident (CVA)    THERAPY DIAG:  Muscle weakness (generalized)  Other lack of coordination  Unsteadiness on feet  Difficulty in walking, not elsewhere classified  Abnormality of gait and mobility  Other abnormalities of gait and mobility  Apraxia  Rationale for Evaluation and Treatment: Rehabilitation  SUBJECTIVE:                                                                                                                                                                                             SUBJECTIVE STATEMENT: Patient reports no falls but states she is frustrated due to lack of help with standing/walking at facility.    PERTINENT HISTORY:  Daughter present for Evaluation to provide hx. Pt is familiar to this clinic and has been seen for multiple  bouts PT since initial CVA in 2021. Pt d/c'ed from PT serviced in April of 2024 with plans to receive PT services through [facility]. Daughter reports that she was only receiving minimal therapy in facility. Reports at least 1 fall in the last 6 months and daughter asked staff to transfer pt with lift since fall.  Daughter would want pt to demonstrate improve safety with transfers to allow bil transfers with reduced use of rail to allow pt to return home for assisted living.    PAIN:  Are you having pain? No  PRECAUTIONS: Fall  RED FLAGS: None   WEIGHT BEARING RESTRICTIONS: No  FALLS: Has patient fallen in last 6 months? Yes. Number of falls 1  LIVING ENVIRONMENT: Lives with: lives with their family and lives in a skilled nursing facility Lives in: Other SNF  Stairs: No Has following equipment at home: Hemi walker and Wheelchair (manual)  PLOF: Requires assistive  device for independence, Needs assistance with ADLs, Needs assistance with homemaking, and WC level currently   PATIENT GOALS: move better - walking or transferring with hemi walking   OBJECTIVE:  Note: Objective measures were completed at Evaluation unless otherwise noted.  DIAGNOSTIC FINDINGS:  Head CT:  IMPRESSION: 1. No acute intracranial abnormality. 2. No acute displaced fracture or traumatic listhesis of the cervical spine.  Cervical CT:  MPRESSION: 1. No acute intracranial abnormality. 2. No acute displaced fracture or traumatic listhesis of the cervical spine.  COGNITION: Overall cognitive status: History of cognitive impairments - at baseline   SENSATION: Light touch: Impaired  Proprioception: Impaired  Able to detect depe pressure   COORDINATION: Increased tone on the RLE   EDEMA:  Mild RLE distal edema   MUSCLE TONE: RLE: Mild and Moderate  MUSCLE LENGTH: Hamstrings: grossly limited   POSTURE: rounded shoulders, forward head, and posterior bias   LOWER EXTREMITY ROM:     Active   Right Eval Left Eval  Hip flexion  Limited to 5 deg beyond 90 in sitting  Knee flexion  90deg in sitting  Knee extension  Lacking 10 deg full extension  Ankle dorsiflexion  none  Ankle plantarflexion  none   (Blank rows = not tested)  LOWER EXTREMITY MMT:    MMT Right Eval Left Eval  Hip flexion  2+  Hip extension    Hip abduction  2  Hip adduction  3  Hip internal rotation    Hip external rotation    Knee flexion  2+  Knee extension  3+  Ankle dorsiflexion  0  Ankle plantarflexion  0  Ankle inversion    Ankle eversion    (Blank rows = not tested)  BED MOBILITY:  Sit to supine Mod A Supine to sit Mod A Rolling to Right Min A Rolling to Left Min A  TRANSFERS: Assistive device utilized: Hemi walker  Sit to stand: Mod A Stand to sit: Mod A Chair to chair: Mod A Floor: unable to perform   CURB:  Level of Assistance: Total A Assistive device utilized: rail Curb Comments: unable to perform   GAIT: Gait pattern: non-functional constant posterior bias , step to pattern, decreased stance time- Right, Right hip hike, lateral hip instability, and lateral lean- Left Distance walked: 94ft Assistive device utilized: Hemi walker Level of assistance: Mod A Comments: moderate cues for sequencing of HW and gait pattern in turns   FUNCTIONAL TESTS:  5 times sit to stand: 48 sec with mod Assist from PT  Timed up and go (TUG): unable to perform  10 meter walk test: 6 ft with min-mod assist and HW.  FIST:                                                                                                                               TREATMENT DATE: 06/13/2024  TA: - Sit to stand - focusing on consistency with hand placement-  Min VC for technique early but patient able to perform 15 reps (slowly) focusing on anterior weight shift -Dynamic lateral weight shifting  while standing x 20 reps x 2.  -Feet staggered then some A/P weight shifting x 20 reps each direction -Static stand  without UE support - x 5 min - VC/Reminders to stand tall and look ahead as well as intermittent cues to shift over to RLE   -Forward/backward stepping at support bar with LUE support-  max difficulty swinging RLE back into hip ext and increased time and effort to swing forward today as well.  Step tapping onto 2 block with LUE Support- x 15 reps each- Max difficulty clearing RLE off floor and achieving adequate height to step tap due to extensor tone.    PATIENT EDUCATION: Education details: .  Pt educated throughout session about proper posture and technique with exercises. Improved exercise technique, movement at target joints, use of target muscles after min to mod verbal, visual, tactile cues Interpretation of outcome measure; need for continued progress to justify continued PT services.   Person educated: Patient Education method: Explanation Education comprehension: verbalized understanding  HOME EXERCISE PROGRAM: Access Code: [email or web address] URL: [email or web address] Date: 12/05/2023 Prepared by: Massie Dollar  Exercises - Sit to Stand with Counter Support  - 1 x daily - 3-4 x weekly - 2 sets - 10 reps - Seated Long Arc Quad  - 1 x daily - 3-4 x weekly - 2 sets - 10 reps - 2 hold - Seated March  - 1 x daily - 3-4 x weekly - 2 sets - 10 reps - 2 hold - Standing Hip Abduction with Counter Support  - 1 x daily - 7 x weekly - 3 sets - 5 reps - Wide Stance with Counter Support  - 1 x daily - 7 x weekly - 3 sets - 2 reps - 30 hold - Seated Hip Abduction  - 1 x daily - 7 x weekly - 3 sets - 10 reps - Supine Bridge  - 1 x daily - 7 x weekly - 3 sets - 10 reps - Supine Short Arc Quad  - 1 x daily - 7 x weekly - 3 sets - 10 reps - Bent Knee Fallouts  - 1 x daily - 7 x weekly - 3 sets - 10 reps - Small Range Straight Leg Raise  - 1 x daily - 7 x weekly - 3 sets - 10 reps - Supine Heel Slide  - 1 x daily - 7 x weekly - 3 sets - 10 reps  GOALS: Goals reviewed with patient?  Yes  SHORT TERM GOALS: Target date: 05/10/2024  Patient will be independent in home exercise program to improve strength/mobility for better functional independence with ADLs. Baseline: to be provided on visit 2: 12/28/2023- No caregiver with patient to determine if compliant with HEP Goal  5/13: daugher states that they are completing some exercises almost daily; 04/09/2024= No caregiver present today to f/u with compliance of HEP- Patient verbalized yes and stated standing, moving my legs.  status: IN PROGRESS  LONG TERM GOALS: Target date: 07/02/2024  2.  Patient (> 62 years old) will complete five times sit to stand test by 15 seconds and only min assist indicating an increased LE strength and improved balance. Baseline: 48 with mod assist from PT; 12/28/2023= 44 sec from EOM at lowest postion (approx 21 in) with Left UE support and CGA to min A from PT 5/13: 28.80 sec with HW  from [facility]; min assist from PT for full erect standing; 04/09/2024= 26.56 sec with HW in front- pushing up using LUE on armrest of w/c- Min Assist for full erect standing 8/26: 25.41 sec with UE pushing from arm rest and reaching for rail on wall; min a to mod assist from PT throughout all repetitions for adequate anterior weight shift  Goal status: Not Met   3.  Patient will increase FIST score by > 6 points to demonstrate decreased fall risk during functional activities Baseline: 37 5/13: 46/56 Goal status: MET  4.  Patient will complete 10 meter walk test to with HW in less than 1 minute with min assist as to improve gait speed for better community ambulation and to reduce fall risk. Baseline: 6 ft with HW and mod assist; 12/28/2023=18 feet with HW- mod assist  5/13: 2:11 min with HW, min-mod assist. 04/09/2024= Patient ambulated approx 22 feet using HW with Min assist and mod VC for gait sequencing-then experienced LOB requiring Mod Assist to maintain standing. 8/26: 2:52min (0 .69m/s) with HW and min fading to mod assist to  advance the RLE with fatigue (165 sec) Goal status: PROGRESSING  5.  Patient will perform stand pivot transfer to and from [facility] with HW and CGA for safety to allow safe transfer to toilet with daughter without use of rail on the wall. Baseline: mod assist and heavy use of rail. 12/28/2023= Min to Mod assist with repetitive VC for technique 5/13: min assist overall with HW; mod assist initially both directions  to prevent posterior LOB. 04/09/2024- Min assist physically in both directions yet increased VC for safety- if provided more time- patient able to make correct movements 75% of time during transfers- still inconsistent with reaching back and pivoting on Right LE  8/26: mod assist from PT to the R and L on this day due to posterior LOB  Goal status: Progressing  6.  Patient will improve bed mobility to min assist only to reduce care giver burden and improve safety of transfers  Baseline: mod-max assist 5/13: Min-mod assist for sit<>supine. Min assist only for RLE management for rolling; 04/09/2024- unable to test due to time  8/26. Mod assist for sit<>supine movement at trunk and RLE and min assist for rolling tasks on this day.  Goal status: ONGOING  ASSESSMENT:  CLINICAL IMPRESSION:  Patient appears very frustrated today with more difficulty negotiating/moving RLE. She was visibly upset and shaking her head more today. Attempt to put her in more manageable positions like just dynamic weight shifting or static standing then back to more dynamic activities but her RLE was more sluggish and she had more difficulty moving her LE overall today. Will contact her Daughter to see how much help she can receive at the facility for carryover.  Pt will benefit from continued skilled PT to address strength and balance deficits to improve safety for transfers, reduce care giver burden, and allow return to PLOF and possible return home from Assisted living.   OBJECTIVE IMPAIRMENTS: Abnormal gait,  cardiopulmonary status limiting activity, decreased activity tolerance, decreased balance, decreased cognition, decreased coordination, decreased endurance, decreased knowledge of use of DME, decreased mobility, difficulty walking, decreased ROM, decreased strength, hypomobility, increased muscle spasms, impaired flexibility, impaired sensation, impaired tone, impaired UE functional use, improper body mechanics, and postural dysfunction.   ACTIVITY LIMITATIONS: carrying, lifting, bending, standing, squatting, stairs, transfers, bed mobility, continence, bathing, toileting, dressing, reach over head, hygiene/grooming, and locomotion level  PARTICIPATION LIMITATIONS: cleaning, interpersonal relationship, shopping,  and community activity  PERSONAL FACTORS: 3+ comorbidities: HTN, chronic CVA, Afib, Lupus  are also affecting patient's functional outcome.   REHAB POTENTIAL: Fair chronic CVA with poor progress in prior interventions with this clinic  CLINICAL DECISION MAKING: Unstable/unpredictable  EVALUATION COMPLEXITY: High  PLAN:  PT FREQUENCY: 1-2x/week  PT DURATION: 12 weeks  PLANNED INTERVENTIONS: 97110-Therapeutic exercises, 97530- Therapeutic activity, [id number]- Neuromuscular re-education, 97535- Self Care, [id number]- Manual therapy, [phone]- Gait training, [phone]- Orthotic Fit/training, [phone]- Aquatic Therapy, [phone]- Splinting, 97014- Electrical stimulation (unattended), [phone]- Electrical stimulation (manual), Patient/Family education, Balance training, Stair training, Joint mobilization, Vestibular training, Visual/preceptual remediation/compensation, Cognitive remediation, DME instructions, Wheelchair mobility training, Cryotherapy, and Moist heat  PLAN FOR NEXT SESSION:  Continue with functional standing Continue with transfer safety Continue with gait training.   Chyrl London, PT Physical Therapist - [facility]  9:42 PM 06/13/24

## 2024-06-18 ENCOUNTER — Encounter: Admitting: Speech Pathology

## 2024-06-18 ENCOUNTER — Ambulatory Visit

## 2024-06-18 ENCOUNTER — Ambulatory Visit: Admitting: Occupational Therapy

## 2024-06-18 DIAGNOSIS — M6281 Muscle weakness (generalized): Secondary | ICD-10-CM

## 2024-06-18 DIAGNOSIS — R482 Apraxia: Secondary | ICD-10-CM

## 2024-06-18 DIAGNOSIS — R262 Difficulty in walking, not elsewhere classified: Secondary | ICD-10-CM

## 2024-06-18 DIAGNOSIS — R269 Unspecified abnormalities of gait and mobility: Secondary | ICD-10-CM

## 2024-06-18 DIAGNOSIS — R2681 Unsteadiness on feet: Secondary | ICD-10-CM

## 2024-06-18 DIAGNOSIS — R41841 Cognitive communication deficit: Secondary | ICD-10-CM

## 2024-06-18 DIAGNOSIS — R2689 Other abnormalities of gait and mobility: Secondary | ICD-10-CM

## 2024-06-18 DIAGNOSIS — R278 Other lack of coordination: Secondary | ICD-10-CM

## 2024-06-18 NOTE — Therapy (Signed)
 OUTPATIENT PHYSICAL THERAPY TREATMENT    Patient Name: Lindsey [name] MRN: [id number] DOB:01-Sep-1956, 68 y.o., female Today's Date: 06/18/2024  PCP: Lorel Maxie LABOR, MD  REFERRING PROVIDER: Lorilee Sven SQUIBB, MD   END OF SESSION:   PT End of Session - 06/18/24 0850     Visit Number 45    Number of Visits 55    Date for PT Re-Evaluation 07/02/24    Authorization Type Medicare/Medicaid    Progress Note Due on Visit 50    PT Start Time 0846    PT Stop Time 0933    PT Time Calculation (min) 47 min    Equipment Utilized During Treatment Gait belt    Activity Tolerance Patient tolerated treatment well;No increased pain;Patient limited by fatigue    Behavior During Therapy [facility] for tasks assessed/performed                 Past Medical History:  Diagnosis Date   Aphasia    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 No pertinent surgical history. 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   REFERRING DIAG:  Diagnosis  I69.398,R25.2 (ICD-10-CM) - Spasticity as late effect of cerebrovascular accident (CVA)    THERAPY DIAG:  Muscle weakness (generalized)  Other lack of coordination  Unsteadiness on feet  Difficulty in walking, not elsewhere classified  Abnormality of gait and mobility  Other abnormalities of gait and mobility  Apraxia  Cognitive communication deficit  Rationale for Evaluation and Treatment: Rehabilitation  SUBJECTIVE:  SUBJECTIVE STATEMENT: Patient reports mostly a slow weekend with not much going on.  PERTINENT HISTORY:  Daughter present for Evaluation to provide hx. Pt is familiar to this clinic and has been seen for multiple bouts PT since initial CVA in 2021. Pt d/c'ed from PT serviced in April of 2024 with plans to receive PT services through [facility]. Daughter reports that she was only receiving minimal therapy in facility. Reports at least 1 fall in the last 6 months and daughter asked staff to transfer pt with lift since fall.  Daughter would want pt to demonstrate improve safety with transfers to allow bil transfers with reduced use of rail to allow pt to return home for assisted living.    PAIN:  Are you having pain? No  PRECAUTIONS: Fall  RED FLAGS: None   WEIGHT BEARING RESTRICTIONS: No  FALLS: Has patient fallen in last 6 months? Yes. Number of falls 1  LIVING ENVIRONMENT: Lives with: lives with their family and lives in a skilled nursing facility Lives in: Other SNF  Stairs: No Has following equipment at home: Hemi walker and Wheelchair (manual)  PLOF: Requires assistive device for independence, Needs assistance with  ADLs, Needs assistance with homemaking, and WC level currently   PATIENT GOALS: move better - walking or transferring with hemi walking   OBJECTIVE:  Note: Objective measures were completed at Evaluation unless otherwise noted.  DIAGNOSTIC FINDINGS:  Head CT:  IMPRESSION: 1. No acute intracranial abnormality. 2. No acute displaced fracture or traumatic listhesis of the cervical spine.  Cervical CT:  MPRESSION: 1. No acute intracranial abnormality. 2. No acute displaced fracture or traumatic listhesis of the cervical spine.  COGNITION: Overall cognitive status: History of cognitive impairments - at baseline   SENSATION: Light touch: Impaired  Proprioception: Impaired  Able to detect depe pressure   COORDINATION: Increased tone on the RLE   EDEMA:  Mild RLE distal edema   MUSCLE TONE: RLE: Mild and Moderate  MUSCLE LENGTH: Hamstrings: grossly limited   POSTURE: rounded shoulders, forward head, and posterior bias   LOWER EXTREMITY ROM:     Active  Right Eval Left Eval  Hip flexion  Limited to 5 deg beyond 90 in sitting  Knee flexion  90deg in sitting  Knee extension  Lacking 10 deg full extension  Ankle dorsiflexion  none  Ankle plantarflexion  none   (Blank rows = not tested)  LOWER EXTREMITY MMT:    MMT Right Eval Left Eval  Hip flexion  2+  Hip extension    Hip abduction  2  Hip adduction  3  Hip internal rotation    Hip external rotation    Knee flexion  2+  Knee extension  3+  Ankle dorsiflexion  0  Ankle plantarflexion  0  Ankle inversion    Ankle eversion    (Blank rows = not tested)  BED MOBILITY:  Sit to supine Mod A Supine to sit Mod A Rolling to Right Min A Rolling to Left Min A  TRANSFERS: Assistive device utilized: Hemi walker  Sit to stand: Mod A Stand to sit: Mod A Chair to chair: Mod A Floor: unable to perform   CURB:  Level of Assistance: Total A Assistive device utilized: rail Curb Comments: unable to perform    GAIT: Gait pattern: non-functional constant posterior bias , step to pattern, decreased stance time- Right, Right hip hike, lateral hip instability, and lateral lean- Left Distance walked: 2ft Assistive device utilized: Hemi walker Level of  assistance: Mod A Comments: moderate cues for sequencing of HW and gait pattern in turns   FUNCTIONAL TESTS:  5 times sit to stand: 48 sec with mod Assist from PT  Timed up and go (TUG): unable to perform  10 meter walk test: 6 ft with min-mod assist and HW.  FIST:                                                                                                                               TREATMENT DATE: 06/18/2024  TA:   - Sit to stand - Continuing with focus on consistency with hand placement and ability to reach back with stand to sit x 10 reps - only physical min assist today without VC for hand position- patient with 100% compliance without error with hand placement on sit to stand and stand to sit.   Step tap onto incline board (at lowest setting) 3 x 10 reps each LE. (Increased difficulty swinging RLE up onto step- increased time but able to perform with close CGA and min VC) - some increase fatigue with each set (brief rest break between each set while PT reviewed technique)   Step bwd (to focus on hip ext) 2 x 10 reps (difficulty initiating swing back- tactile cues to weight shift to left side- but much improved with subsequent reps)     TE:  AROM in w/c with RLE propped up on PT thigh (long sit position essentially) - HIP IR 2 x 10  AAROM Right knee flex - working on loosening up knee- 2 x 20 reps.  PROM to R knee in sitting x  20 reps  -Manual resistive Hip abd/add in sitting position - Hold 3 sec 2 x 10 reps           PATIENT EDUCATION: Education details: .  Pt educated throughout session about proper posture and technique with exercises. Improved exercise technique, movement at target joints, use of target muscles after  min to mod verbal, visual, tactile cues Interpretation of outcome measure; need for continued progress to justify continued PT services.   Person educated: Patient Education method: Explanation Education comprehension: verbalized understanding  HOME EXERCISE PROGRAM: Access Code: [email or web address] URL: [email or web address] Date: 12/05/2023 Prepared by: Massie Dollar  Exercises - Sit to Stand with Counter Support  - 1 x daily - 3-4 x weekly - 2 sets - 10 reps - Seated Long Arc Quad  - 1 x daily - 3-4 x weekly - 2 sets - 10 reps - 2 hold - Seated March  - 1 x daily - 3-4 x weekly - 2 sets - 10 reps - 2 hold - Standing Hip Abduction with Counter Support  - 1 x daily - 7 x weekly - 3 sets - 5 reps - Wide Stance with Counter Support  - 1 x daily - 7 x weekly - 3 sets - 2 reps - 30 hold - Seated Hip Abduction  - 1  x daily - 7 x weekly - 3 sets - 10 reps - Supine Bridge  - 1 x daily - 7 x weekly - 3 sets - 10 reps - Supine Short Arc Quad  - 1 x daily - 7 x weekly - 3 sets - 10 reps - Bent Knee Fallouts  - 1 x daily - 7 x weekly - 3 sets - 10 reps - Small Range Straight Leg Raise  - 1 x daily - 7 x weekly - 3 sets - 10 reps - Supine Heel Slide  - 1 x daily - 7 x weekly - 3 sets - 10 reps  GOALS: Goals reviewed with patient? Yes  SHORT TERM GOALS: Target date: 05/10/2024  Patient will be independent in home exercise program to improve strength/mobility for better functional independence with ADLs. Baseline: to be provided on visit 2: 12/28/2023- No caregiver with patient to determine if compliant with HEP Goal  5/13: daugher states that they are completing some exercises almost daily; 04/09/2024= No caregiver present today to f/u with compliance of HEP- Patient verbalized yes and stated standing, moving my legs.  status: IN PROGRESS  LONG TERM GOALS: Target date: 07/02/2024  2.  Patient (> 58 years old) will complete five times sit to stand test by 15 seconds and only min assist  indicating an increased LE strength and improved balance. Baseline: 48 with mod assist from PT; 12/28/2023= 44 sec from EOM at lowest postion (approx 21 in) with Left UE support and CGA to min A from PT 5/13: 28.80 sec with HW from [facility]; min assist from PT for full erect standing; 04/09/2024= 26.56 sec with HW in front- pushing up using LUE on armrest of w/c- Min Assist for full erect standing 8/26: 25.41 sec with UE pushing from arm rest and reaching for rail on wall; min a to mod assist from PT throughout all repetitions for adequate anterior weight shift  Goal status: Not Met   3.  Patient will increase FIST score by > 6 points to demonstrate decreased fall risk during functional activities Baseline: 37 5/13: 46/56 Goal status: MET  4.  Patient will complete 10 meter walk test to with HW in less than 1 minute with min assist as to improve gait speed for better community ambulation and to reduce fall risk. Baseline: 6 ft with HW and mod assist; 12/28/2023=18 feet with HW- mod assist  5/13: 2:11 min with HW, min-mod assist. 04/09/2024= Patient ambulated approx 22 feet using HW with Min assist and mod VC for gait sequencing-then experienced LOB requiring Mod Assist to maintain standing. 8/26: 2:57min (0 .39m/s) with HW and min fading to mod assist to advance the RLE with fatigue (165 sec); 06/06/2024= 0.85 m/s sec avg with HM with CGA and close w/c follow. Goal status: PROGRESSING  5.  Patient will perform stand pivot transfer to and from [facility] with HW and CGA for safety to allow safe transfer to toilet with daughter without use of rail on the wall. Baseline: mod assist and heavy use of rail. 12/28/2023= Min to Mod assist with repetitive VC for technique 5/13: min assist overall with HW; mod assist initially both directions  to prevent posterior LOB. 04/09/2024- Min assist physically in both directions yet increased VC for safety- if provided more time- patient able to make correct movements 75% of time during  transfers- still inconsistent with reaching back and pivoting on Right LE  8/26: mod assist from PT to the R and L on this  day due to posterior LOB  Goal status: Progressing  6.  Patient will improve bed mobility to min assist only to reduce care giver burden and improve safety of transfers  Baseline: mod-max assist 5/13: Min-mod assist for sit<>supine. Min assist only for RLE management for rolling; 04/09/2024- unable to test due to time  8/26. Mod assist for sit<>supine movement at trunk and RLE and min assist for rolling tasks on this day.  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 Patient also improved from min assist to stand to CGA. She was also able to consistently raise her leg and swing forward- clearing established incline board. She was also able to swing leg better into hip ext. Will try again in subsequent visit and focus on more walking next visit.  Pt will benefit from continued skilled PT to address strength and balance deficits to improve safety for transfers, reduce care giver burden, and allow return to PLOF and possible return home from Assisted living.   OBJECTIVE IMPAIRMENTS: Abnormal gait, cardiopulmonary status limiting activity, decreased activity tolerance, decreased balance, decreased cognition, decreased coordination, decreased endurance, decreased knowledge of use of DME, decreased mobility, difficulty walking, decreased ROM, decreased strength, hypomobility, increased muscle spasms, impaired flexibility, impaired sensation, impaired tone, impaired UE functional use, improper body mechanics, and postural dysfunction.   ACTIVITY LIMITATIONS: carrying, lifting, bending, standing, squatting, stairs, transfers, bed mobility, continence, bathing, toileting, dressing, reach over head,  hygiene/grooming, and locomotion level  PARTICIPATION LIMITATIONS: cleaning, interpersonal relationship, shopping, and community activity  PERSONAL FACTORS: 3+ comorbidities: HTN, chronic CVA, Afib, Lupus  are also affecting patient's functional outcome.   REHAB POTENTIAL: Fair chronic CVA with poor progress in prior interventions with this clinic  CLINICAL DECISION MAKING: Unstable/unpredictable  EVALUATION COMPLEXITY: High  PLAN:  PT FREQUENCY: 1-2x/week  PT DURATION: 12 weeks  PLANNED INTERVENTIONS: 97110-Therapeutic exercises, 97530- Therapeutic activity, [id number]- Neuromuscular re-education, 97535- Self Care, [id number]- Manual therapy, [phone]- Gait training, [phone]- Orthotic Fit/training, [phone]- Aquatic Therapy, [phone]- Splinting, [id number]- Electrical stimulation (unattended), [phone]- Electrical stimulation (manual), Patient/Family education, Balance training, Stair training, Joint mobilization, Vestibular training, Visual/preceptual remediation/compensation, Cognitive remediation, DME instructions, Wheelchair mobility training, Cryotherapy, and Moist heat  PLAN FOR NEXT SESSION:  Continue with functional standing Continue with transfer safety Continue with gait training.   Chyrl London, PT Physical Therapist - [facility]  9:44 AM 06/18/24   OUTPATIENT PHYSICAL THERAPY TREATMENT    Patient Name: Lindsey [name] MRN: [id number] DOB:May 09, 1956, 68 y.o., female Today's Date: 06/18/2024  PCP: Lorel Maxie LABOR, MD  REFERRING PROVIDER: Lorilee Sven SQUIBB, MD   END OF SESSION:   PT End of Session - 06/18/24 0850     Visit Number 45    Number of Visits 55    Date for PT Re-Evaluation 07/02/24    Authorization Type Medicare/Medicaid    Progress Note Due on Visit 50    PT Start Time 0846    PT Stop Time 0933    PT Time Calculation (min) 47 min    Equipment Utilized During Treatment Gait belt    Activity Tolerance Patient tolerated treatment well;No  increased pain;Patient limited by fatigue    Behavior During Therapy [facility] for tasks assessed/performed                  Past Medical History:  Diagnosis  Date   Aphasia    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 No pertinent surgical history. 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   REFERRING DIAG:  Diagnosis  I69.398,R25.2 (ICD-10-CM) - Spasticity as late effect of cerebrovascular accident (CVA)    THERAPY DIAG:  Muscle weakness (generalized)  Other lack of coordination  Unsteadiness on feet  Difficulty in walking, not elsewhere classified  Abnormality of gait and mobility  Other abnormalities of gait and mobility  Apraxia  Cognitive communication deficit  Rationale for Evaluation and Treatment: Rehabilitation  SUBJECTIVE:                                                                                                                                                                                              SUBJECTIVE STATEMENT: Patient reports no falls but states she is frustrated due to lack of help with standing/walking at facility.    PERTINENT HISTORY:  Daughter present for Evaluation to provide hx. Pt is familiar to this clinic and has been seen for multiple bouts PT since initial CVA in 2021. Pt d/c'ed from PT serviced in April of 2024 with plans to receive PT services through [facility]. Daughter reports that she was only receiving minimal therapy in facility. Reports at least 1 fall in the last 6 months and daughter asked staff to transfer pt with lift since fall.  Daughter would want pt to demonstrate improve safety with transfers to allow bil transfers with reduced use  of rail to allow pt to return home for assisted living.    PAIN:  Are you having pain? No  PRECAUTIONS: Fall  RED FLAGS: None   WEIGHT BEARING RESTRICTIONS: No  FALLS: Has patient fallen in last 6 months? Yes. Number of falls 1  LIVING ENVIRONMENT: Lives with: lives with their family and lives in a skilled nursing facility Lives in: Other SNF  Stairs: No Has following equipment at home: Hemi walker and Wheelchair (manual)  PLOF: Requires assistive device for independence, Needs assistance with ADLs, Needs assistance with homemaking, and WC level currently   PATIENT GOALS: move better - walking or transferring with hemi walking   OBJECTIVE:  Note: Objective measures were completed at Evaluation unless otherwise noted.  DIAGNOSTIC FINDINGS:  Head CT:  IMPRESSION: 1. No acute intracranial abnormality. 2. No acute displaced fracture or traumatic listhesis of the cervical spine.  Cervical CT:  MPRESSION: 1. No acute intracranial abnormality. 2. No acute displaced fracture or traumatic listhesis of the cervical spine.  COGNITION: Overall cognitive  status: History of cognitive impairments - at baseline   SENSATION: Light touch: Impaired  Proprioception: Impaired  Able to detect depe pressure   COORDINATION: Increased tone on the RLE   EDEMA:  Mild RLE distal edema   MUSCLE TONE: RLE: Mild and Moderate  MUSCLE LENGTH: Hamstrings: grossly limited   POSTURE: rounded shoulders, forward head, and posterior bias   LOWER EXTREMITY ROM:     Active  Right Eval Left Eval  Hip flexion  Limited to 5 deg beyond 90 in sitting  Knee flexion  90deg in sitting  Knee extension  Lacking 10 deg full extension  Ankle dorsiflexion  none  Ankle plantarflexion  none   (Blank rows = not tested)  LOWER EXTREMITY MMT:    MMT Right Eval Left Eval  Hip flexion  2+  Hip extension    Hip abduction  2  Hip adduction  3  Hip internal rotation    Hip external rotation    Knee flexion  2+  Knee extension  3+  Ankle dorsiflexion  0  Ankle plantarflexion  0  Ankle inversion    Ankle eversion    (Blank rows = not tested)  BED MOBILITY:  Sit to supine Mod A Supine to sit Mod A Rolling to Right Min A Rolling to Left Min A  TRANSFERS: Assistive device utilized: Hemi walker  Sit to stand: Mod A Stand to sit: Mod A Chair to chair: Mod A Floor: unable to perform   CURB:  Level of Assistance: Total A Assistive device utilized: rail Curb Comments: unable to perform   GAIT: Gait pattern: non-functional constant posterior bias , step to pattern, decreased stance time- Right, Right hip hike, lateral hip instability, and lateral lean- Left Distance walked: 64ft Assistive device utilized: Hemi walker Level of assistance: Mod A Comments: moderate cues for sequencing of HW and gait pattern in turns   FUNCTIONAL TESTS:  5 times sit to stand: 48 sec with mod Assist from PT  Timed up and go (TUG): unable to perform  10 meter walk test: 6 ft with min-mod assist and HW.  FIST:  TREATMENT DATE: 06/18/2024  TA: - Sit to stand - focusing on consistency with hand placement- Min VC for technique early but patient able to perform 15 reps (slowly) focusing on anterior weight shift -Dynamic lateral weight shifting  while standing x 20 reps x 2.  -Feet staggered then some A/P weight shifting x 20 reps each direction -Static stand without UE support - x 5 min - VC/Reminders to stand tall and look ahead as well as intermittent cues to shift over to RLE   -Forward/backward stepping at support bar with LUE support-  max difficulty swinging RLE back into hip ext and increased time and effort to swing forward today as well.  Step tapping onto 2 block with LUE Support- x 15 reps each- Max difficulty clearing RLE off floor and achieving adequate height to step tap due to extensor tone.    PATIENT EDUCATION: Education details: .  Pt educated throughout session about proper posture and technique with exercises. Improved exercise technique, movement at target joints, use of target muscles after min to mod verbal, visual, tactile cues Interpretation of outcome measure; need for continued progress to justify continued PT services.   Person educated: Patient Education method: Explanation Education comprehension: verbalized understanding  HOME EXERCISE PROGRAM: Access Code: [email or web address] URL: [email or web address] Date: 12/05/2023 Prepared by: Massie Dollar  Exercises - Sit to Stand with Counter Support  - 1 x daily - 3-4 x weekly - 2 sets - 10 reps - Seated Long Arc Quad  - 1 x daily - 3-4 x weekly - 2 sets - 10 reps - 2 hold - Seated March  - 1 x daily - 3-4 x weekly - 2 sets - 10 reps - 2 hold - Standing Hip Abduction with Counter Support  - 1 x daily - 7 x weekly - 3 sets - 5 reps - Wide Stance with Counter Support  - 1 x daily - 7 x weekly - 3 sets - 2 reps - 30 hold - Seated Hip Abduction  - 1 x daily -  7 x weekly - 3 sets - 10 reps - Supine Bridge  - 1 x daily - 7 x weekly - 3 sets - 10 reps - Supine Short Arc Quad  - 1 x daily - 7 x weekly - 3 sets - 10 reps - Bent Knee Fallouts  - 1 x daily - 7 x weekly - 3 sets - 10 reps - Small Range Straight Leg Raise  - 1 x daily - 7 x weekly - 3 sets - 10 reps - Supine Heel Slide  - 1 x daily - 7 x weekly - 3 sets - 10 reps  GOALS: Goals reviewed with patient? Yes  SHORT TERM GOALS: Target date: 05/10/2024  Patient will be independent in home exercise program to improve strength/mobility for better functional independence with ADLs. Baseline: to be provided on visit 2: 12/28/2023- No caregiver with patient to determine if compliant with HEP Goal  5/13: daugher states that they are completing some exercises almost daily; 04/09/2024= No caregiver present today to f/u with compliance of HEP- Patient verbalized yes and stated standing, moving my legs.  status: IN PROGRESS  LONG TERM GOALS: Target date: 07/02/2024  2.  Patient (> 68 years old) will complete five times sit to stand test by 15 seconds and only min assist indicating an increased LE strength and improved balance. Baseline: 48 with mod assist from PT; 12/28/2023= 44 sec from EOM at lowest postion (approx 21 in) with  Left UE support and CGA to min A from PT 5/13: 28.80 sec with HW from [facility]; min assist from PT for full erect standing; 04/09/2024= 26.56 sec with HW in front- pushing up using LUE on armrest of w/c- Min Assist for full erect standing 8/26: 25.41 sec with UE pushing from arm rest and reaching for rail on wall; min a to mod assist from PT throughout all repetitions for adequate anterior weight shift  Goal status: Not Met   3.  Patient will increase FIST score by > 6 points to demonstrate decreased fall risk during functional activities Baseline: 37 5/13: 46/56 Goal status: MET  4.  Patient will complete 10 meter walk test to with HW in less than 1 minute with min assist as to improve  gait speed for better community ambulation and to reduce fall risk. Baseline: 6 ft with HW and mod assist; 12/28/2023=18 feet with HW- mod assist  5/13: 2:11 min with HW, min-mod assist. 04/09/2024= Patient ambulated approx 22 feet using HW with Min assist and mod VC for gait sequencing-then experienced LOB requiring Mod Assist to maintain standing. 8/26: 2:95min (0 .43m/s) with HW and min fading to mod assist to advance the RLE with fatigue (165 sec) Goal status: PROGRESSING  5.  Patient will perform stand pivot transfer to and from [facility] with HW and CGA for safety to allow safe transfer to toilet with daughter without use of rail on the wall. Baseline: mod assist and heavy use of rail. 12/28/2023= Min to Mod assist with repetitive VC for technique 5/13: min assist overall with HW; mod assist initially both directions  to prevent posterior LOB. 04/09/2024- Min assist physically in both directions yet increased VC for safety- if provided more time- patient able to make correct movements 75% of time during transfers- still inconsistent with reaching back and pivoting on Right LE  8/26: mod assist from PT to the R and L on this day due to posterior LOB  Goal status: Progressing  6.  Patient will improve bed mobility to min assist only to reduce care giver burden and improve safety of transfers  Baseline: mod-max assist 5/13: Min-mod assist for sit<>supine. Min assist only for RLE management for rolling; 04/09/2024- unable to test due to time  8/26. Mod assist for sit<>supine movement at trunk and RLE and min assist for rolling tasks on this day.  Goal status: ONGOING  ASSESSMENT:  CLINICAL IMPRESSION:  Patient appears very frustrated today with more difficulty negotiating/moving RLE. She was visibly upset and shaking her head more today. Attempt to put her in more manageable positions like just dynamic weight shifting or static standing then back to more dynamic activities but her RLE was more sluggish and  she had more difficulty moving her LE overall today. Will contact her Daughter to see how much help she can receive at the facility for carryover.  Pt will benefit from continued skilled PT to address strength and balance deficits to improve safety for transfers, reduce care giver burden, and allow return to PLOF and possible return home from Assisted living.   OBJECTIVE IMPAIRMENTS: Abnormal gait, cardiopulmonary status limiting activity, decreased activity tolerance, decreased balance, decreased cognition, decreased coordination, decreased endurance, decreased knowledge of use of DME, decreased mobility, difficulty walking, decreased ROM, decreased strength, hypomobility, increased muscle spasms, impaired flexibility, impaired sensation, impaired tone, impaired UE functional use, improper body mechanics, and postural dysfunction.   ACTIVITY LIMITATIONS: carrying, lifting, bending, standing, squatting, stairs, transfers, bed mobility, continence, bathing, toileting,  dressing, reach over head, hygiene/grooming, and locomotion level  PARTICIPATION LIMITATIONS: cleaning, interpersonal relationship, shopping, and community activity  PERSONAL FACTORS: 3+ comorbidities: HTN, chronic CVA, Afib, Lupus  are also affecting patient's functional outcome.   REHAB POTENTIAL: Fair chronic CVA with poor progress in prior interventions with this clinic  CLINICAL DECISION MAKING: Unstable/unpredictable  EVALUATION COMPLEXITY: High  PLAN:  PT FREQUENCY: 1-2x/week  PT DURATION: 12 weeks  PLANNED INTERVENTIONS: 97110-Therapeutic exercises, 97530- Therapeutic activity, [id number]- Neuromuscular re-education, 97535- Self Care, [id number]- Manual therapy, [phone]- Gait training, [phone]- Orthotic Fit/training, [phone]- Aquatic Therapy, [phone]- Splinting, [id number]- Electrical stimulation (unattended), [phone]- Electrical stimulation (manual), Patient/Family education, Balance training, Stair training, Joint mobilization, Vestibular  training, Visual/preceptual remediation/compensation, Cognitive remediation, DME instructions, Wheelchair mobility training, Cryotherapy, and Moist heat  PLAN FOR NEXT SESSION:  Continue with functional standing Continue with transfer safety Continue with gait training.   Chyrl London, PT Physical Therapist - [facility]  9:44 AM 06/18/24

## 2024-06-19 NOTE — Therapy (Signed)
 OUTPATIENT PHYSICAL THERAPY TREATMENT    Patient Name: Lindsey [name] MRN: [id number] DOB:May 25, 1956, 68 y.o., female Today's Date: 06/20/2024  PCP: Lorel Maxie LABOR, MD  REFERRING PROVIDER: Lorilee Sven SQUIBB, MD   END OF SESSION:   PT End of Session - 06/20/24 1609     Visit Number 46    Number of Visits 55    Date for Recertification  07/02/24    Authorization Type Medicare/Medicaid    Progress Note Due on Visit 50    PT Start Time 0931    PT Stop Time 1004    PT Time Calculation (min) 33 min    Equipment Utilized During Treatment Gait belt    Activity Tolerance Patient tolerated treatment well;No increased pain;Patient limited by fatigue    Behavior During Therapy [facility] for tasks assessed/performed                  Past Medical History:  Diagnosis Date   Aphasia    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 No pertinent surgical history. 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   REFERRING DIAG:  Diagnosis  I69.398,R25.2 (ICD-10-CM) - Spasticity as late effect of cerebrovascular accident (CVA)    THERAPY DIAG:  Muscle weakness (generalized)  Other lack of coordination  Unsteadiness on feet  Difficulty in walking, not elsewhere classified  Abnormality of gait and mobility  Other abnormalities of gait and mobility  Apraxia  Cognitive communication deficit  Rationale for Evaluation and Treatment: Rehabilitation  SUBJECTIVE:  SUBJECTIVE STATEMENT: Patient reports no changes since last visit.    PERTINENT HISTORY:  Daughter present for Evaluation to provide hx. Pt is familiar to this clinic and has been seen for multiple bouts PT since initial CVA in 2021. Pt d/c'ed from PT serviced in April of 2024 with plans to receive PT services through [facility]. Daughter reports that she was only receiving minimal therapy in facility. Reports at least 1 fall in the last 6 months and daughter asked staff to transfer pt with lift since fall.  Daughter would want pt to demonstrate improve safety with transfers to allow bil transfers with reduced use of rail to allow pt to return home for assisted living.    PAIN:  Are you having pain? No  PRECAUTIONS: Fall  RED FLAGS: None   WEIGHT BEARING RESTRICTIONS: No  FALLS: Has patient fallen in last 6 months? Yes. Number of falls 1  LIVING ENVIRONMENT: Lives with: lives with their family and lives in a skilled nursing facility Lives in: Other SNF  Stairs: No Has following equipment at home: Hemi walker and Wheelchair (manual)  PLOF: Requires assistive device for independence, Needs assistance with ADLs, Needs  assistance with homemaking, and WC level currently   PATIENT GOALS: move better - walking or transferring with hemi walking   OBJECTIVE:  Note: Objective measures were completed at Evaluation unless otherwise noted.  DIAGNOSTIC FINDINGS:  Head CT:  IMPRESSION: 1. No acute intracranial abnormality. 2. No acute displaced fracture or traumatic listhesis of the cervical spine.  Cervical CT:  MPRESSION: 1. No acute intracranial abnormality. 2. No acute displaced fracture or traumatic listhesis of the cervical spine.  COGNITION: Overall cognitive status: History of cognitive impairments - at baseline   SENSATION: Light touch: Impaired  Proprioception: Impaired  Able to detect depe pressure   COORDINATION: Increased tone on the RLE   EDEMA:  Mild RLE distal edema   MUSCLE TONE: RLE: Mild and Moderate  MUSCLE LENGTH: Hamstrings: grossly limited   POSTURE: rounded shoulders, forward head, and posterior bias   LOWER EXTREMITY ROM:     Active  Right Eval Left Eval  Hip flexion  Limited to 5 deg beyond 90 in sitting  Knee flexion  90deg in sitting  Knee extension  Lacking 10 deg full extension  Ankle dorsiflexion  none  Ankle plantarflexion  none   (Blank rows = not tested)  LOWER EXTREMITY MMT:    MMT Right Eval Left Eval  Hip flexion  2+  Hip extension    Hip abduction  2  Hip adduction  3  Hip internal rotation    Hip external rotation    Knee flexion  2+  Knee extension  3+  Ankle dorsiflexion  0  Ankle plantarflexion  0  Ankle inversion    Ankle eversion    (Blank rows = not tested)  BED MOBILITY:  Sit to supine Mod A Supine to sit Mod A Rolling to Right Min A Rolling to Left Min A  TRANSFERS: Assistive device utilized: Hemi walker  Sit to stand: Mod A Stand to sit: Mod A Chair to chair: Mod A Floor: unable to perform   CURB:  Level of Assistance: Total A Assistive device utilized: rail Curb Comments: unable to perform   GAIT: Gait  pattern: non-functional constant posterior bias , step to pattern, decreased stance time- Right, Right hip hike, lateral hip instability, and lateral lean- Left Distance walked: 59ft Assistive device utilized: Hemi walker Level of assistance: Mod  A Comments: moderate cues for sequencing of HW and gait pattern in turns   FUNCTIONAL TESTS:  5 times sit to stand: 48 sec with mod Assist from PT  Timed up and go (TUG): unable to perform  10 meter walk test: 6 ft with min-mod assist and HW.  FIST:                                                                                                                               TREATMENT DATE: 06/20/2024   GAIT:  Patient ambulated 15 feet (from 1 stationary chair to another focusing on standing up without hesitating and then walk to another chair- focusing on turning 180 feet then backing up) - patient performed 4 rounds today- Focusing on increasing step length, standing erect, and pivoting/turning feet. Patient became emotional at times today- frustrated with turning and backing up and on last round requested to stop today- requesting to end and go home. She denied any pain but requested to end session.   TA: - Sit to stand - Continuing with focus on consistency with hand placement and ability to reach back with stand to sit x 10 reps -VC for technique - mostly for reaching back  - Static stand in place without UE Support focusing on erect posture and RLE weight bearing.             PATIENT EDUCATION: Education details: .  Pt educated throughout session about proper posture and technique with exercises. Improved exercise technique, movement at target joints, use of target muscles after min to mod verbal, visual, tactile cues Interpretation of outcome measure; need for continued progress to justify continued PT services.   Person educated: Patient Education method: Explanation Education comprehension: verbalized understanding  HOME  EXERCISE PROGRAM: Access Code: [email or web address] URL: [email or web address] Date: 12/05/2023 Prepared by: Massie Dollar  Exercises - Sit to Stand with Counter Support  - 1 x daily - 3-4 x weekly - 2 sets - 10 reps - Seated Long Arc Quad  - 1 x daily - 3-4 x weekly - 2 sets - 10 reps - 2 hold - Seated March  - 1 x daily - 3-4 x weekly - 2 sets - 10 reps - 2 hold - Standing Hip Abduction with Counter Support  - 1 x daily - 7 x weekly - 3 sets - 5 reps - Wide Stance with Counter Support  - 1 x daily - 7 x weekly - 3 sets - 2 reps - 30 hold - Seated Hip Abduction  - 1 x daily - 7 x weekly - 3 sets - 10 reps - Supine Bridge  - 1 x daily - 7 x weekly - 3 sets - 10 reps - Supine Short Arc Quad  - 1 x daily - 7 x weekly - 3 sets - 10 reps - Bent Knee Fallouts  - 1 x daily - 7 x weekly - 3 sets - 10 reps -  Small Range Straight Leg Raise  - 1 x daily - 7 x weekly - 3 sets - 10 reps - Supine Heel Slide  - 1 x daily - 7 x weekly - 3 sets - 10 reps  GOALS: Goals reviewed with patient? Yes  SHORT TERM GOALS: Target date: 05/10/2024  Patient will be independent in home exercise program to improve strength/mobility for better functional independence with ADLs. Baseline: to be provided on visit 2: 12/28/2023- No caregiver with patient to determine if compliant with HEP Goal  5/13: daugher states that they are completing some exercises almost daily; 04/09/2024= No caregiver present today to f/u with compliance of HEP- Patient verbalized yes and stated standing, moving my legs.  status: IN PROGRESS  LONG TERM GOALS: Target date: 07/02/2024  2.  Patient (> 82 years old) will complete five times sit to stand test by 15 seconds and only min assist indicating an increased LE strength and improved balance. Baseline: 48 with mod assist from PT; 12/28/2023= 44 sec from EOM at lowest postion (approx 21 in) with Left UE support and CGA to min A from PT 5/13: 28.80 sec with HW from [facility]; min assist from PT for full  erect standing; 04/09/2024= 26.56 sec with HW in front- pushing up using LUE on armrest of w/c- Min Assist for full erect standing 8/26: 25.41 sec with UE pushing from arm rest and reaching for rail on wall; min a to mod assist from PT throughout all repetitions for adequate anterior weight shift  Goal status: Not Met   3.  Patient will increase FIST score by > 6 points to demonstrate decreased fall risk during functional activities Baseline: 37 5/13: 46/56 Goal status: MET  4.  Patient will complete 10 meter walk test to with HW in less than 1 minute with min assist as to improve gait speed for better community ambulation and to reduce fall risk. Baseline: 6 ft with HW and mod assist; 12/28/2023=18 feet with HW- mod assist  5/13: 2:11 min with HW, min-mod assist. 04/09/2024= Patient ambulated approx 22 feet using HW with Min assist and mod VC for gait sequencing-then experienced LOB requiring Mod Assist to maintain standing. 8/26: 2:77min (0 .32m/s) with HW and min fading to mod assist to advance the RLE with fatigue (165 sec); 06/06/2024= 0.85 m/s sec avg with HM with CGA and close w/c follow. Goal status: PROGRESSING  5.  Patient will perform stand pivot transfer to and from [facility] with HW and CGA for safety to allow safe transfer to toilet with daughter without use of rail on the wall. Baseline: mod assist and heavy use of rail. 12/28/2023= Min to Mod assist with repetitive VC for technique 5/13: min assist overall with HW; mod assist initially both directions  to prevent posterior LOB. 04/09/2024- Min assist physically in both directions yet increased VC for safety- if provided more time- patient able to make correct movements 75% of time during transfers- still inconsistent with reaching back and pivoting on Right LE  8/26: mod assist from PT to the R and L on this day due to posterior LOB  Goal status: Progressing  6.  Patient will improve bed mobility to min assist only to reduce care giver burden and  improve safety of transfers  Baseline: mod-max assist 5/13: Min-mod assist for sit<>supine. Min assist only for RLE management for rolling; 04/09/2024- unable to test due to time  8/26. Mod assist for sit<>supine movement at trunk and RLE and min assist for  rolling tasks on this day.  Goal status: ONGOING  ASSESSMENT:  CLINICAL IMPRESSION:  Patient was self limiting today requested to end session early due to frustration with her performance. She was responsive to [facility] for gait sequencing yet struggled at times with efficiency and coordination with turning 180 deg to sit down. She was inconsistent but eventually requested to end session. She did not specify exactly why- she did deny any pain or excessive fatigue.  Pt will benefit from continued skilled PT to address strength and balance deficits to improve safety for transfers, reduce care giver burden, and allow return to PLOF and possible return home from Assisted living.   OBJECTIVE IMPAIRMENTS: Abnormal gait, cardiopulmonary status limiting activity, decreased activity tolerance, decreased balance, decreased cognition, decreased coordination, decreased endurance, decreased knowledge of use of DME, decreased mobility, difficulty walking, decreased ROM, decreased strength, hypomobility, increased muscle spasms, impaired flexibility, impaired sensation, impaired tone, impaired UE functional use, improper body mechanics, and postural dysfunction.   ACTIVITY LIMITATIONS: carrying, lifting, bending, standing, squatting, stairs, transfers, bed mobility, continence, bathing, toileting, dressing, reach over head, hygiene/grooming, and locomotion level  PARTICIPATION LIMITATIONS: cleaning, interpersonal relationship, shopping, and community activity  PERSONAL FACTORS: 3+ comorbidities: HTN, chronic CVA, Afib, Lupus  are also affecting patient's functional outcome.   REHAB POTENTIAL: Fair chronic CVA with poor progress in prior interventions with this  clinic  CLINICAL DECISION MAKING: Unstable/unpredictable  EVALUATION COMPLEXITY: High  PLAN:  PT FREQUENCY: 1-2x/week  PT DURATION: 12 weeks  PLANNED INTERVENTIONS: 97110-Therapeutic exercises, 97530- Therapeutic activity, [id number]- Neuromuscular re-education, 97535- Self Care, [id number]- Manual therapy, [phone]- Gait training, [phone]- Orthotic Fit/training, [phone]- Aquatic Therapy, [phone]- Splinting, [id number]- Electrical stimulation (unattended), [phone]- Electrical stimulation (manual), Patient/Family education, Balance training, Stair training, Joint mobilization, Vestibular training, Visual/preceptual remediation/compensation, Cognitive remediation, DME instructions, Wheelchair mobility training, Cryotherapy, and Moist heat  PLAN FOR NEXT SESSION:  Continue with functional standing Continue with transfer safety Continue with gait training.   Chyrl London, PT Physical Therapist - [place]  [facility]  4:22 PM 06/20/24   OUTPATIENT PHYSICAL THERAPY TREATMENT    Patient Name: Lindsey [name] MRN: [id number] DOB:[birth year], 68 y.o., female Today's Date: 06/20/2024  PCP: Lorel Maxie LABOR, MD  REFERRING PROVIDER: Lorilee Sven SQUIBB, MD   END OF SESSION:   PT End of Session - 06/20/24 1609     Visit Number 46    Number of Visits 55    Date for Recertification  07/02/24    Authorization Type Medicare/Medicaid    Progress Note Due on Visit 50    PT Start Time 0931    PT Stop Time 1004    PT Time Calculation (min) 33 min    Equipment Utilized During Treatment Gait belt    Activity Tolerance Patient tolerated treatment well;No increased pain;Patient limited by fatigue    Behavior During Therapy [facility] for tasks assessed/performed                   Past Medical History:  Diagnosis Date   Aphasia    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 No pertinent surgical history. 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   REFERRING DIAG:  Diagnosis  I69.398,R25.2 (ICD-10-CM) - Spasticity as late effect of cerebrovascular accident (CVA)    THERAPY DIAG:  Muscle weakness (generalized)  Other lack of coordination  Unsteadiness on feet  Difficulty in walking, not elsewhere classified  Abnormality of gait and mobility  Other abnormalities of gait and mobility  Apraxia  Cognitive communication deficit  Rationale for Evaluation and Treatment: Rehabilitation  SUBJECTIVE:                                                                                                                                                                                             SUBJECTIVE STATEMENT: Patient reports no falls but states she is frustrated due to lack of help with standing/walking at facility.    PERTINENT HISTORY:  Daughter present for Evaluation to  provide hx. Pt is familiar to this clinic and has been seen for multiple bouts PT since initial CVA in 2021. Pt d/c'ed from PT serviced in April of 2024 with plans to receive PT services through [facility]. Daughter reports that she was only receiving minimal therapy in facility. Reports at least 1 fall in the last 6 months and daughter asked staff to transfer pt with lift since fall.  Daughter would want pt to demonstrate improve safety with transfers to allow bil transfers with reduced use of rail to allow pt to return home for assisted living.    PAIN:  Are you having pain? No  PRECAUTIONS: Fall  RED FLAGS: None   WEIGHT BEARING RESTRICTIONS: No  FALLS: Has patient fallen in last 6 months? Yes. Number of falls 1  LIVING ENVIRONMENT: Lives with: lives with their family and lives in a skilled nursing facility Lives in: Other SNF  Stairs: No Has following equipment at home: Hemi walker and Wheelchair (manual)  PLOF: Requires  assistive device for independence, Needs assistance with ADLs, Needs assistance with homemaking, and WC level currently   PATIENT GOALS: move better - walking or transferring with hemi walking   OBJECTIVE:  Note: Objective measures were completed at Evaluation unless otherwise noted.  DIAGNOSTIC FINDINGS:  Head CT:  IMPRESSION: 1. No acute intracranial abnormality. 2. No acute displaced fracture or traumatic listhesis of the cervical spine.  Cervical CT:  MPRESSION: 1. No acute intracranial abnormality. 2. No acute displaced fracture or traumatic listhesis of the cervical spine.  COGNITION: Overall cognitive status: History of cognitive impairments - at baseline   SENSATION: Light touch: Impaired  Proprioception: Impaired  Able to detect depe pressure   COORDINATION: Increased tone on the RLE   EDEMA:  Mild RLE distal edema   MUSCLE TONE: RLE: Mild and Moderate  MUSCLE LENGTH: Hamstrings: grossly limited   POSTURE: rounded shoulders,  forward head, and posterior bias   LOWER EXTREMITY ROM:     Active  Right Eval Left Eval  Hip flexion  Limited to 5 deg beyond 90 in sitting  Knee flexion  90deg in sitting  Knee extension  Lacking 10 deg full extension  Ankle dorsiflexion  none  Ankle plantarflexion  none   (Blank rows = not tested)  LOWER EXTREMITY MMT:    MMT Right Eval Left Eval  Hip flexion  2+  Hip extension    Hip abduction  2  Hip adduction  3  Hip internal rotation    Hip external rotation    Knee flexion  2+  Knee extension  3+  Ankle dorsiflexion  0  Ankle plantarflexion  0  Ankle inversion    Ankle eversion    (Blank rows = not tested)  BED MOBILITY:  Sit to supine Mod A Supine to sit Mod A Rolling to Right Min A Rolling to Left Min A  TRANSFERS: Assistive device utilized: Hemi walker  Sit to stand: Mod A Stand to sit: Mod A Chair to chair: Mod A Floor: unable to perform   CURB:  Level of Assistance: Total A Assistive device utilized: rail Curb Comments: unable to perform   GAIT: Gait pattern: non-functional constant posterior bias , step to pattern, decreased stance time- Right, Right hip hike, lateral hip instability, and lateral lean- Left Distance walked: 13ft Assistive device utilized: Hemi walker Level of assistance: Mod A Comments: moderate cues for sequencing of HW and gait pattern in turns   FUNCTIONAL TESTS:  5 times sit to stand: 48 sec with mod Assist from PT  Timed up and go (TUG): unable to perform  10 meter walk test: 6 ft with min-mod assist and HW.  FIST:                                                                                                                               TREATMENT DATE: 06/20/2024  TA: - Sit to stand - focusing on consistency with hand  placement- Min VC for technique early but patient able to perform 15 reps (slowly) focusing on anterior weight shift -Dynamic lateral weight shifting  while standing x 20 reps x 2.  -Feet staggered  then some A/P weight shifting x 20 reps each direction -Static stand without UE support - x 5 min - VC/Reminders to stand tall and look ahead as well as intermittent cues to shift over to RLE   -Forward/backward stepping at support bar with LUE support-  max difficulty swinging RLE back into hip ext and increased time and effort to swing forward today as well.  Step tapping onto 2 block with LUE Support- x 15 reps each- Max difficulty clearing RLE off floor and achieving adequate height to step tap due to extensor tone.    PATIENT EDUCATION: Education details: .  Pt educated throughout session about proper posture and technique with exercises. Improved exercise technique, movement at target joints, use of target muscles after min to mod verbal, visual, tactile cues Interpretation of outcome measure; need for continued progress to justify continued PT services.   Person educated: Patient Education method: Explanation Education comprehension: verbalized understanding  HOME EXERCISE PROGRAM: Access Code: [email or web address] URL: [email or web address] Date: 12/05/2023 Prepared by: Massie Dollar  Exercises - Sit to Stand with Counter Support  - 1 x daily - 3-4 x weekly - 2 sets - 10 reps - Seated Long Arc Quad  - 1 x daily - 3-4 x weekly - 2 sets - 10 reps - 2 hold - Seated March  - 1 x daily - 3-4 x weekly - 2 sets - 10 reps - 2 hold - Standing Hip Abduction with Counter Support  - 1 x daily - 7 x weekly - 3 sets - 5 reps - Wide Stance with Counter Support  - 1 x daily - 7 x weekly - 3 sets - 2 reps - 30 hold - Seated Hip Abduction  - 1 x daily - 7 x weekly - 3 sets - 10 reps - Supine Bridge  - 1 x daily - 7 x weekly - 3 sets - 10 reps - Supine Short Arc Quad  - 1 x daily - 7 x weekly - 3 sets - 10 reps - Bent Knee Fallouts  - 1 x daily - 7 x weekly - 3 sets - 10 reps - Small Range Straight Leg Raise  - 1 x daily - 7 x weekly - 3 sets - 10 reps - Supine Heel Slide  - 1 x daily - 7 x  weekly - 3 sets - 10 reps  GOALS: Goals reviewed with patient? Yes  SHORT TERM GOALS: Target date: 05/10/2024  Patient will be independent in home exercise program to improve strength/mobility for better functional independence with ADLs. Baseline: to be provided on visit 2: 12/28/2023- No caregiver with patient to determine if compliant with HEP Goal  5/13: daugher states that they are completing some exercises almost daily; 04/09/2024= No caregiver present today to f/u with compliance of HEP- Patient verbalized yes and stated standing, moving my legs.  status: IN PROGRESS  LONG TERM GOALS: Target date: 07/02/2024  2.  Patient (> 56 years old) will complete five times sit to stand test by 15 seconds and only min assist indicating an increased LE strength and improved balance. Baseline: 48 with mod assist from PT; 12/28/2023= 44 sec from EOM at lowest postion (approx 21 in) with Left UE support and CGA to min A from PT 5/13: 28.80 sec with  HW from [facility]; min assist from PT for full erect standing; 04/09/2024= 26.56 sec with HW in front- pushing up using LUE on armrest of w/c- Min Assist for full erect standing 8/26: 25.41 sec with UE pushing from arm rest and reaching for rail on wall; min a to mod assist from PT throughout all repetitions for adequate anterior weight shift  Goal status: Not Met   3.  Patient will increase FIST score by > 6 points to demonstrate decreased fall risk during functional activities Baseline: 37 5/13: 46/56 Goal status: MET  4.  Patient will complete 10 meter walk test to with HW in less than 1 minute with min assist as to improve gait speed for better community ambulation and to reduce fall risk. Baseline: 6 ft with HW and mod assist; 12/28/2023=18 feet with HW- mod assist  5/13: 2:11 min with HW, min-mod assist. 04/09/2024= Patient ambulated approx 22 feet using HW with Min assist and mod VC for gait sequencing-then experienced LOB requiring Mod Assist to maintain  standing. 8/26: 2:14min (0 .25m/s) with HW and min fading to mod assist to advance the RLE with fatigue (165 sec) Goal status: PROGRESSING  5.  Patient will perform stand pivot transfer to and from [facility] with HW and CGA for safety to allow safe transfer to toilet with daughter without use of rail on the wall. Baseline: mod assist and heavy use of rail. 12/28/2023= Min to Mod assist with repetitive VC for technique 5/13: min assist overall with HW; mod assist initially both directions  to prevent posterior LOB. 04/09/2024- Min assist physically in both directions yet increased VC for safety- if provided more time- patient able to make correct movements 75% of time during transfers- still inconsistent with reaching back and pivoting on Right LE  8/26: mod assist from PT to the R and L on this day due to posterior LOB  Goal status: Progressing  6.  Patient will improve bed mobility to min assist only to reduce care giver burden and improve safety of transfers  Baseline: mod-max assist 5/13: Min-mod assist for sit<>supine. Min assist only for RLE management for rolling; 04/09/2024- unable to test due to time  8/26. Mod assist for sit<>supine movement at trunk and RLE and min assist for rolling tasks on this day.  Goal status: ONGOING  ASSESSMENT:  CLINICAL IMPRESSION:  Patient appears very frustrated today with more difficulty negotiating/moving RLE. She was visibly upset and shaking her head more today. Attempt to put her in more manageable positions like just dynamic weight shifting or static standing then back to more dynamic activities but her RLE was more sluggish and she had more difficulty moving her LE overall today. Will contact her Daughter to see how much help she can receive at the facility for carryover.  Pt will benefit from continued skilled PT to address strength and balance deficits to improve safety for transfers, reduce care giver burden, and allow return to PLOF and possible return home  from Assisted living.   OBJECTIVE IMPAIRMENTS: Abnormal gait, cardiopulmonary status limiting activity, decreased activity tolerance, decreased balance, decreased cognition, decreased coordination, decreased endurance, decreased knowledge of use of DME, decreased mobility, difficulty walking, decreased ROM, decreased strength, hypomobility, increased muscle spasms, impaired flexibility, impaired sensation, impaired tone, impaired UE functional use, improper body mechanics, and postural dysfunction.   ACTIVITY LIMITATIONS: carrying, lifting, bending, standing, squatting, stairs, transfers, bed mobility, continence, bathing, toileting, dressing, reach over head, hygiene/grooming, and locomotion level  PARTICIPATION LIMITATIONS: cleaning, interpersonal relationship,  shopping, and community activity  PERSONAL FACTORS: 3+ comorbidities: HTN, chronic CVA, Afib, Lupus  are also affecting patient's functional outcome.   REHAB POTENTIAL: Fair chronic CVA with poor progress in prior interventions with this clinic  CLINICAL DECISION MAKING: Unstable/unpredictable  EVALUATION COMPLEXITY: High  PLAN:  PT FREQUENCY: 1-2x/week  PT DURATION: 12 weeks  PLANNED INTERVENTIONS: 97110-Therapeutic exercises, 97530- Therapeutic activity, [id number]- Neuromuscular re-education, 97535- Self Care, [id number]- Manual therapy, [phone]- Gait training, [phone]- Orthotic Fit/training, [phone]- Aquatic Therapy, [phone]- Splinting, [id number]- Electrical stimulation (unattended), [phone]- Electrical stimulation (manual), Patient/Family education, Balance training, Stair training, Joint mobilization, Vestibular training, Visual/preceptual remediation/compensation, Cognitive remediation, DME instructions, Wheelchair mobility training, Cryotherapy, and Moist heat  PLAN FOR NEXT SESSION:  Continue with functional standing Continue with transfer safety Continue with gait training.   Chyrl London, PT Physical Therapist - [facility]  4:22 PM 06/20/24

## 2024-06-20 ENCOUNTER — Encounter: Admitting: Speech Pathology

## 2024-06-20 ENCOUNTER — Ambulatory Visit: Admitting: Occupational Therapy

## 2024-06-20 ENCOUNTER — Ambulatory Visit

## 2024-06-20 DIAGNOSIS — R2689 Other abnormalities of gait and mobility: Secondary | ICD-10-CM

## 2024-06-20 DIAGNOSIS — R2681 Unsteadiness on feet: Secondary | ICD-10-CM

## 2024-06-20 DIAGNOSIS — R262 Difficulty in walking, not elsewhere classified: Secondary | ICD-10-CM

## 2024-06-20 DIAGNOSIS — M6281 Muscle weakness (generalized): Secondary | ICD-10-CM | POA: Diagnosis not present

## 2024-06-20 DIAGNOSIS — R269 Unspecified abnormalities of gait and mobility: Secondary | ICD-10-CM

## 2024-06-20 DIAGNOSIS — R482 Apraxia: Secondary | ICD-10-CM

## 2024-06-20 DIAGNOSIS — R41841 Cognitive communication deficit: Secondary | ICD-10-CM

## 2024-06-20 DIAGNOSIS — R278 Other lack of coordination: Secondary | ICD-10-CM

## 2024-06-24 NOTE — Therapy (Signed)
 OUTPATIENT PHYSICAL THERAPY TREATMENT    Patient Name: Lindsey [name] MRN: [id number] DOB:Sep 10, 1956, 68 y.o., female Today's Date: 06/25/2024  PCP: Lorel Maxie LABOR, MD  REFERRING PROVIDER: Lorilee Sven SQUIBB, MD   END OF SESSION:   PT End of Session - 06/25/24 0859     Visit Number 47    Number of Visits 55    Date for Recertification  07/02/24    Authorization Type Medicare/Medicaid    Progress Note Due on Visit 50    PT Start Time 0853    PT Stop Time 0933    PT Time Calculation (min) 40 min    Equipment Utilized During Treatment Gait belt    Activity Tolerance Patient tolerated treatment well;No increased pain;Patient limited by fatigue    Behavior During Therapy [facility] for tasks assessed/performed                  Past Medical History:  Diagnosis Date   Aphasia    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 No pertinent surgical history. 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   REFERRING DIAG:  Diagnosis  I69.398,R25.2 (ICD-10-CM) - Spasticity as late effect of cerebrovascular accident (CVA)    THERAPY DIAG:  Muscle weakness (generalized)  Other lack of coordination  Unsteadiness on feet  Difficulty in walking, not elsewhere classified  Abnormality of gait and mobility  Other abnormalities of gait and mobility  Apraxia  Rationale for Evaluation and Treatment: Rehabilitation  SUBJECTIVE:  SUBJECTIVE STATEMENT: Patient reports no changes since last visit.    PERTINENT HISTORY:  Daughter present for Evaluation to provide hx. Pt is familiar to this clinic and has been seen for multiple bouts PT since initial CVA in 2021. Pt d/c'ed from PT serviced in April of 2024 with plans to receive PT services through [facility]. Daughter reports that she was only receiving minimal therapy in facility. Reports at least 1 fall in the last 6 months and daughter asked staff to transfer pt with lift since fall.  Daughter would want pt to demonstrate improve safety with transfers to allow bil transfers with reduced use of rail to allow pt to return home for assisted living.    PAIN:  Are you having pain? No  PRECAUTIONS: Fall  RED FLAGS: None   WEIGHT BEARING RESTRICTIONS: No  FALLS: Has patient fallen in last 6 months? Yes. Number of falls 1  LIVING ENVIRONMENT: Lives with: lives with their family and lives in a skilled nursing facility Lives in: Other SNF  Stairs: No Has following equipment at home: Hemi walker and Wheelchair (manual)  PLOF: Requires assistive device for independence, Needs assistance with ADLs, Needs assistance with homemaking, and WC level  currently   PATIENT GOALS: move better - walking or transferring with hemi walking   OBJECTIVE:  Note: Objective measures were completed at Evaluation unless otherwise noted.  DIAGNOSTIC FINDINGS:  Head CT:  IMPRESSION: 1. No acute intracranial abnormality. 2. No acute displaced fracture or traumatic listhesis of the cervical spine.  Cervical CT:  MPRESSION: 1. No acute intracranial abnormality. 2. No acute displaced fracture or traumatic listhesis of the cervical spine.  COGNITION: Overall cognitive status: History of cognitive impairments - at baseline   SENSATION: Light touch: Impaired  Proprioception: Impaired  Able to detect depe pressure   COORDINATION: Increased tone on the RLE   EDEMA:  Mild RLE distal edema   MUSCLE TONE: RLE: Mild and Moderate  MUSCLE LENGTH: Hamstrings: grossly limited   POSTURE: rounded shoulders, forward head, and posterior bias   LOWER EXTREMITY ROM:     Active  Right Eval Left Eval  Hip flexion  Limited to 5 deg beyond 90 in sitting  Knee flexion  90deg in sitting  Knee extension  Lacking 10 deg full extension  Ankle dorsiflexion  none  Ankle plantarflexion  none   (Blank rows = not tested)  LOWER EXTREMITY MMT:    MMT Right Eval Left Eval  Hip flexion  2+  Hip extension    Hip abduction  2  Hip adduction  3  Hip internal rotation    Hip external rotation    Knee flexion  2+  Knee extension  3+  Ankle dorsiflexion  0  Ankle plantarflexion  0  Ankle inversion    Ankle eversion    (Blank rows = not tested)  BED MOBILITY:  Sit to supine Mod A Supine to sit Mod A Rolling to Right Min A Rolling to Left Min A  TRANSFERS: Assistive device utilized: Hemi walker  Sit to stand: Mod A Stand to sit: Mod A Chair to chair: Mod A Floor: unable to perform   CURB:  Level of Assistance: Total A Assistive device utilized: rail Curb Comments: unable to perform   GAIT: Gait pattern: non-functional constant posterior  bias , step to pattern, decreased stance time- Right, Right hip hike, lateral hip instability, and lateral lean- Left Distance walked: 73ft Assistive device utilized: Hemi walker Level of assistance: Mod  A Comments: moderate cues for sequencing of HW and gait pattern in turns   FUNCTIONAL TESTS:  5 times sit to stand: 48 sec with mod Assist from PT  Timed up and go (TUG): unable to perform  10 meter walk test: 6 ft with min-mod assist and HW.  FIST:                                                                                                                               TREATMENT DATE: 06/25/2024   GAIT:  Patient ambulated 15 feet (from 1 stationary chair to another focusing on standing up without hesitating and then walk to another chair- focusing on turning 180 feet then backing up) - patient performed 4 rounds today- Focusing on increasing step length, standing erect, and pivoting/turning feet. Patient became emotional at times today- frustrated with turning and backing up and on last round requested to stop today- requesting to end and go home. She denied any pain but requested to end session.   TA: - Sit to stand - Continuing with focus on consistency with hand placement and ability to reach back with stand to sit x 10 reps -VC for technique - mostly for reaching back. - Chair manuevering- swinging to left in transport chair back to right x 5.              PATIENT EDUCATION: Education details: .  Pt educated throughout session about proper posture and technique with exercises. Improved exercise technique, movement at target joints, use of target muscles after min to mod verbal, visual, tactile cues Interpretation of outcome measure; need for continued progress to justify continued PT services.   Person educated: Patient Education method: Explanation Education comprehension: verbalized understanding  HOME EXERCISE PROGRAM: Access Code: [email or web address] URL:  [email or web address] Date: 12/05/2023 Prepared by: Massie Dollar  Exercises - Sit to Stand with Counter Support  - 1 x daily - 3-4 x weekly - 2 sets - 10 reps - Seated Long Arc Quad  - 1 x daily - 3-4 x weekly - 2 sets - 10 reps - 2 hold - Seated March  - 1 x daily - 3-4 x weekly - 2 sets - 10 reps - 2 hold - Standing Hip Abduction with Counter Support  - 1 x daily - 7 x weekly - 3 sets - 5 reps - Wide Stance with Counter Support  - 1 x daily - 7 x weekly - 3 sets - 2 reps - 30 hold - Seated Hip Abduction  - 1 x daily - 7 x weekly - 3 sets - 10 reps - Supine Bridge  - 1 x daily - 7 x weekly - 3 sets - 10 reps - Supine Short Arc Quad  - 1 x daily - 7 x weekly - 3 sets - 10 reps - Bent Knee Fallouts  - 1 x daily - 7 x weekly - 3 sets - 10 reps - Small Range  Straight Leg Raise  - 1 x daily - 7 x weekly - 3 sets - 10 reps - Supine Heel Slide  - 1 x daily - 7 x weekly - 3 sets - 10 reps  GOALS: Goals reviewed with patient? Yes  SHORT TERM GOALS: Target date: 05/10/2024  Patient will be independent in home exercise program to improve strength/mobility for better functional independence with ADLs. Baseline: to be provided on visit 2: 12/28/2023- No caregiver with patient to determine if compliant with HEP Goal  5/13: daugher states that they are completing some exercises almost daily; 04/09/2024= No caregiver present today to f/u with compliance of HEP- Patient verbalized yes and stated standing, moving my legs.  status: IN PROGRESS  LONG TERM GOALS: Target date: 07/02/2024  2.  Patient (> 55 years old) will complete five times sit to stand test by 15 seconds and only min assist indicating an increased LE strength and improved balance. Baseline: 48 with mod assist from PT; 12/28/2023= 44 sec from EOM at lowest postion (approx 21 in) with Left UE support and CGA to min A from PT 5/13: 28.80 sec with HW from [facility]; min assist from PT for full erect standing; 04/09/2024= 26.56 sec with HW in  front- pushing up using LUE on armrest of w/c- Min Assist for full erect standing 8/26: 25.41 sec with UE pushing from arm rest and reaching for rail on wall; min a to mod assist from PT throughout all repetitions for adequate anterior weight shift  Goal status: Not Met   3.  Patient will increase FIST score by > 6 points to demonstrate decreased fall risk during functional activities Baseline: 37 5/13: 46/56 Goal status: MET  4.  Patient will complete 10 meter walk test to with HW in less than 1 minute with min assist as to improve gait speed for better community ambulation and to reduce fall risk. Baseline: 6 ft with HW and mod assist; 12/28/2023=18 feet with HW- mod assist  5/13: 2:11 min with HW, min-mod assist. 04/09/2024= Patient ambulated approx 22 feet using HW with Min assist and mod VC for gait sequencing-then experienced LOB requiring Mod Assist to maintain standing. 8/26: 2:5min (0 .9m/s) with HW and min fading to mod assist to advance the RLE with fatigue (165 sec); 06/06/2024= 0.85 m/s sec avg with HM with CGA and close w/c follow. Goal status: PROGRESSING  5.  Patient will perform stand pivot transfer to and from [facility] with HW and CGA for safety to allow safe transfer to toilet with daughter without use of rail on the wall. Baseline: mod assist and heavy use of rail. 12/28/2023= Min to Mod assist with repetitive VC for technique 5/13: min assist overall with HW; mod assist initially both directions  to prevent posterior LOB. 04/09/2024- Min assist physically in both directions yet increased VC for safety- if provided more time- patient able to make correct movements 75% of time during transfers- still inconsistent with reaching back and pivoting on Right LE  8/26: mod assist from PT to the R and L on this day due to posterior LOB  Goal status: Progressing  6.  Patient will improve bed mobility to min assist only to reduce care giver burden and improve safety of transfers  Baseline:  mod-max assist 5/13: Min-mod assist for sit<>supine. Min assist only for RLE management for rolling; 04/09/2024- unable to test due to time  8/26. Mod assist for sit<>supine movement at trunk and RLE and min assist for rolling tasks  on this day.  Goal status: ONGOING  ASSESSMENT:  CLINICAL IMPRESSION:  Patient was self limiting today requested to end session early due to frustration with her performance. She was responsive to [facility] for gait sequencing yet struggled at times with efficiency and coordination with turning 180 deg to sit down. She was inconsistent but eventually requested to end session. She did not specify exactly why- she did deny any pain or excessive fatigue.  Pt will benefit from continued skilled PT to address strength and balance deficits to improve safety for transfers, reduce care giver burden, and allow return to PLOF and possible return home from Assisted living.   OBJECTIVE IMPAIRMENTS: Abnormal gait, cardiopulmonary status limiting activity, decreased activity tolerance, decreased balance, decreased cognition, decreased coordination, decreased endurance, decreased knowledge of use of DME, decreased mobility, difficulty walking, decreased ROM, decreased strength, hypomobility, increased muscle spasms, impaired flexibility, impaired sensation, impaired tone, impaired UE functional use, improper body mechanics, and postural dysfunction.   ACTIVITY LIMITATIONS: carrying, lifting, bending, standing, squatting, stairs, transfers, bed mobility, continence, bathing, toileting, dressing, reach over head, hygiene/grooming, and locomotion level  PARTICIPATION LIMITATIONS: cleaning, interpersonal relationship, shopping, and community activity  PERSONAL FACTORS: 3+ comorbidities: HTN, chronic CVA, Afib, Lupus  are also affecting patient's functional outcome.   REHAB POTENTIAL: Fair chronic CVA with poor progress in prior interventions with this clinic  CLINICAL DECISION MAKING:  Unstable/unpredictable  EVALUATION COMPLEXITY: High  PLAN:  PT FREQUENCY: 1-2x/week  PT DURATION: 12 weeks  PLANNED INTERVENTIONS: 97110-Therapeutic exercises, 97530- Therapeutic activity, [id number]- Neuromuscular re-education, 97535- Self Care, [id number]- Manual therapy, [phone]- Gait training, [phone]- Orthotic Fit/training, [phone]- Aquatic Therapy, [phone]- Splinting, [id number]- Electrical stimulation (unattended), [phone]- Electrical stimulation (manual), Patient/Family education, Balance training, Stair training, Joint mobilization, Vestibular training, Visual/preceptual remediation/compensation, Cognitive remediation, DME instructions, Wheelchair mobility training, Cryotherapy, and Moist heat  PLAN FOR NEXT SESSION:  Continue with functional standing Continue with transfer safety Continue with gait training.   Chyrl London, PT Physical Therapist - [facility]  12:14 PM 06/25/24   OUTPATIENT PHYSICAL THERAPY TREATMENT    Patient Name: Lindsey [name] MRN: [id number] DOB:May 03, 1956, 68 y.o., female Today's Date: 06/25/2024  PCP: Lorel Maxie LABOR, MD  REFERRING PROVIDER: Lorilee Sven SQUIBB, MD   END OF SESSION:   PT End of Session - 06/25/24 0859     Visit Number 47    Number of Visits 55    Date for Recertification  07/02/24    Authorization Type Medicare/Medicaid    Progress Note Due on Visit 50    PT Start Time 0853    PT Stop Time 0933    PT Time Calculation (min) 40 min    Equipment Utilized During Treatment Gait belt    Activity Tolerance Patient tolerated treatment well;No increased pain;Patient limited by fatigue    Behavior During Therapy [facility] for tasks assessed/performed                   Past Medical History:  Diagnosis Date   Aphasia    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 No pertinent surgical history. 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   REFERRING DIAG:  Diagnosis  I69.398,R25.2 (ICD-10-CM) - Spasticity as late effect of cerebrovascular accident (CVA)    THERAPY DIAG:  Muscle weakness (generalized)  Other lack of coordination  Unsteadiness on feet  Difficulty in walking, not elsewhere classified  Abnormality of gait and mobility  Other abnormalities of gait and mobility  Apraxia  Rationale for Evaluation and Treatment: Rehabilitation  SUBJECTIVE:                                                                                                                                                                                             SUBJECTIVE STATEMENT: Patient reports doing okay. Dtr present today and states she is going to have caregiver assist patient more at facility if able.   PERTINENT HISTORY:  Daughter present for Evaluation to provide hx. Pt is familiar to this clinic and has been seen for  multiple bouts PT since initial CVA in 2021. Pt d/c'ed from PT serviced in April of 2024 with plans to receive PT services through [facility]. Daughter reports that she was only receiving minimal therapy in facility. Reports at least 1 fall in the last 6 months and daughter asked staff to transfer pt with lift since fall.  Daughter would want pt to demonstrate improve safety with transfers to allow bil transfers with reduced use of rail to allow pt to return home for assisted living.    PAIN:  Are you having pain? No  PRECAUTIONS: Fall  RED FLAGS: None   WEIGHT BEARING RESTRICTIONS: No  FALLS: Has patient fallen in last 6 months? Yes. Number of falls 1  LIVING ENVIRONMENT: Lives with: lives with their family and lives in a skilled nursing facility Lives in: Other SNF  Stairs: No Has following equipment at home: Hemi walker and Wheelchair (manual)  PLOF: Requires assistive device for independence, Needs  assistance with ADLs, Needs assistance with homemaking, and WC level currently   PATIENT GOALS: move better - walking or transferring with hemi walking   OBJECTIVE:  Note: Objective measures were completed at Evaluation unless otherwise noted.  DIAGNOSTIC FINDINGS:  Head CT:  IMPRESSION: 1. No acute intracranial abnormality. 2. No acute displaced fracture or traumatic listhesis of the cervical spine.  Cervical CT:  MPRESSION: 1. No acute intracranial abnormality. 2. No acute displaced fracture or traumatic listhesis of the cervical spine.  COGNITION: Overall cognitive status: History of cognitive impairments - at baseline   SENSATION: Light touch: Impaired  Proprioception: Impaired  Able to detect depe pressure   COORDINATION: Increased tone on the RLE   EDEMA:  Mild RLE distal edema   MUSCLE TONE: RLE: Mild and Moderate  MUSCLE LENGTH: Hamstrings: grossly limited   POSTURE: rounded shoulders, forward head, and posterior bias   LOWER EXTREMITY ROM:      Active  Right Eval Left Eval  Hip flexion  Limited to 5 deg beyond 90 in sitting  Knee flexion  90deg in sitting  Knee extension  Lacking 10 deg full extension  Ankle dorsiflexion  none  Ankle plantarflexion  none   (Blank rows = not tested)  LOWER EXTREMITY MMT:    MMT Right Eval Left Eval  Hip flexion  2+  Hip extension    Hip abduction  2  Hip adduction  3  Hip internal rotation    Hip external rotation    Knee flexion  2+  Knee extension  3+  Ankle dorsiflexion  0  Ankle plantarflexion  0  Ankle inversion    Ankle eversion    (Blank rows = not tested)  BED MOBILITY:  Sit to supine Mod A Supine to sit Mod A Rolling to Right Min A Rolling to Left Min A  TRANSFERS: Assistive device utilized: Hemi walker  Sit to stand: Mod A Stand to sit: Mod A Chair to chair: Mod A Floor: unable to perform   CURB:  Level of Assistance: Total A Assistive device utilized: rail Curb Comments: unable to perform   GAIT: Gait pattern: non-functional constant posterior bias , step to pattern, decreased stance time- Right, Right hip hike, lateral hip instability, and lateral lean- Left Distance walked: 55ft Assistive device utilized: Hemi walker Level of assistance: Mod A Comments: moderate cues for sequencing of HW and gait pattern in turns   FUNCTIONAL TESTS:  5 times sit to stand: 48 sec with mod Assist from PT  Timed up and go (TUG): unable to perform  10 meter walk test: 6 ft with min-mod assist and HW.  FIST:                                                                                                                               TREATMENT DATE: 06/25/2024  TA: - Sit to stand - focusing on consistency with hand placement- Min VC for technique  early but patient able to perform 15 reps (slowly) focusing on anterior weight shift -Dynamic lateral weight shifting  while standing x 20 reps x 2.  -Feet staggered then some A/P weight shifting x 20 reps each  direction -Static stand without UE support - x 5 min - VC/Reminders to stand tall and look ahead as well as intermittent cues to shift over to RLE   -Forward/backward stepping at support bar with LUE support-  max difficulty swinging RLE back into hip ext and increased time and effort to swing forward today as well.  Step tapping onto 2 block with LUE Support- x 15 reps each- Max difficulty clearing RLE off floor and achieving adequate height to step tap due to extensor tone.    PATIENT EDUCATION: Education details: .  Pt educated throughout session about proper posture and technique with exercises. Improved exercise technique, movement at target joints, use of target muscles after min to mod verbal, visual, tactile cues Interpretation of outcome measure; need for continued progress to justify continued PT services.   Person educated: Patient Education method: Explanation Education comprehension: verbalized understanding  HOME EXERCISE PROGRAM: Access Code: [email or web address] URL: [email or web address] Date: 12/05/2023 Prepared by: Massie Dollar  Exercises - Sit to Stand with Counter Support  - 1 x daily - 3-4 x weekly - 2 sets - 10 reps - Seated Long Arc Quad  - 1 x daily - 3-4 x weekly - 2 sets - 10 reps - 2 hold - Seated March  - 1 x daily - 3-4 x weekly - 2 sets - 10 reps - 2 hold - Standing Hip Abduction with Counter Support  - 1 x daily - 7 x weekly - 3 sets - 5 reps - Wide Stance with Counter Support  - 1 x daily - 7 x weekly - 3 sets - 2 reps - 30 hold - Seated Hip Abduction  - 1 x daily - 7 x weekly - 3 sets - 10 reps - Supine Bridge  - 1 x daily - 7 x weekly - 3 sets - 10 reps - Supine Short Arc Quad  - 1 x daily - 7 x weekly - 3 sets - 10 reps - Bent Knee Fallouts  - 1 x daily - 7 x weekly - 3 sets - 10 reps - Small Range Straight Leg Raise  - 1 x daily - 7 x weekly - 3 sets - 10 reps - Supine Heel Slide  - 1 x daily - 7 x weekly - 3 sets - 10 reps  GOALS: Goals  reviewed with patient? Yes  SHORT TERM GOALS: Target date: 05/10/2024  Patient will be independent in home exercise program to improve strength/mobility for better functional independence with ADLs. Baseline: to be provided on visit 2: 12/28/2023- No caregiver with patient to determine if compliant with HEP Goal  5/13: daugher states that they are completing some exercises almost daily; 04/09/2024= No caregiver present today to f/u with compliance of HEP- Patient verbalized yes and stated standing, moving my legs.  status: IN PROGRESS  LONG TERM GOALS: Target date: 07/02/2024  2.  Patient (> 25 years old) will complete five times sit to stand test by 15 seconds and only min assist indicating an increased LE strength and improved balance. Baseline: 48 with mod assist from PT; 12/28/2023= 44 sec from EOM at lowest postion (approx 21 in) with Left UE support and CGA to min A from PT 5/13: 28.80 sec with HW from WC; min assist  from PT for full erect standing; 04/09/2024= 26.56 sec with HW in front- pushing up using LUE on armrest of w/c- Min Assist for full erect standing 8/26: 25.41 sec with UE pushing from arm rest and reaching for rail on wall; min a to mod assist from PT throughout all repetitions for adequate anterior weight shift  Goal status: Not Met   3.  Patient will increase FIST score by > 6 points to demonstrate decreased fall risk during functional activities Baseline: 37 5/13: 46/56 Goal status: MET  4.  Patient will complete 10 meter walk test to with HW in less than 1 minute with min assist as to improve gait speed for better community ambulation and to reduce fall risk. Baseline: 6 ft with HW and mod assist; 12/28/2023=18 feet with HW- mod assist  5/13: 2:11 min with HW, min-mod assist. 04/09/2024= Patient ambulated approx 22 feet using HW with Min assist and mod VC for gait sequencing-then experienced LOB requiring Mod Assist to maintain standing. 8/26: 2:35min (0 .50m/s) with HW and min  fading to mod assist to advance the RLE with fatigue (165 sec) Goal status: PROGRESSING  5.  Patient will perform stand pivot transfer to and from [facility] with HW and CGA for safety to allow safe transfer to toilet with daughter without use of rail on the wall. Baseline: mod assist and heavy use of rail. 12/28/2023= Min to Mod assist with repetitive VC for technique 5/13: min assist overall with HW; mod assist initially both directions  to prevent posterior LOB. 04/09/2024- Min assist physically in both directions yet increased VC for safety- if provided more time- patient able to make correct movements 75% of time during transfers- still inconsistent with reaching back and pivoting on Right LE  8/26: mod assist from PT to the R and L on this day due to posterior LOB  Goal status: Progressing  6.  Patient will improve bed mobility to min assist only to reduce care giver burden and improve safety of transfers  Baseline: mod-max assist 5/13: Min-mod assist for sit<>supine. Min assist only for RLE management for rolling; 04/09/2024- unable to test due to time  8/26. Mod assist for sit<>supine movement at trunk and RLE and min assist for rolling tasks on this day.  Goal status: ONGOING  ASSESSMENT:  CLINICAL IMPRESSION:  Patient appears very frustrated today with more difficulty negotiating/moving RLE. She was visibly upset and shaking her head more today. Attempt to put her in more manageable positions like just dynamic weight shifting or static standing then back to more dynamic activities but her RLE was more sluggish and she had more difficulty moving her LE overall today. Will contact her Daughter to see how much help she can receive at the facility for carryover.  Pt will benefit from continued skilled PT to address strength and balance deficits to improve safety for transfers, reduce care giver burden, and allow return to PLOF and possible return home from Assisted living.   OBJECTIVE IMPAIRMENTS:  Abnormal gait, cardiopulmonary status limiting activity, decreased activity tolerance, decreased balance, decreased cognition, decreased coordination, decreased endurance, decreased knowledge of use of DME, decreased mobility, difficulty walking, decreased ROM, decreased strength, hypomobility, increased muscle spasms, impaired flexibility, impaired sensation, impaired tone, impaired UE functional use, improper body mechanics, and postural dysfunction.   ACTIVITY LIMITATIONS: carrying, lifting, bending, standing, squatting, stairs, transfers, bed mobility, continence, bathing, toileting, dressing, reach over head, hygiene/grooming, and locomotion level  PARTICIPATION LIMITATIONS: cleaning, interpersonal relationship, shopping, and community activity  PERSONAL FACTORS: 3+ comorbidities: HTN, chronic CVA, Afib, Lupus  are also affecting patient's functional outcome.   REHAB POTENTIAL: Fair chronic CVA with poor progress in prior interventions with this clinic  CLINICAL DECISION MAKING: Unstable/unpredictable  EVALUATION COMPLEXITY: High  PLAN:  PT FREQUENCY: 1-2x/week  PT DURATION: 12 weeks  PLANNED INTERVENTIONS: 97110-Therapeutic exercises, 97530- Therapeutic activity, [id number]- Neuromuscular re-education, 97535- Self Care, [id number]- Manual therapy, [phone]- Gait training, [phone]- Orthotic Fit/training, [phone]- Aquatic Therapy, [phone]- Splinting, [id number]- Electrical stimulation (unattended), [phone]- Electrical stimulation (manual), Patient/Family education, Balance training, Stair training, Joint mobilization, Vestibular training, Visual/preceptual remediation/compensation, Cognitive remediation, DME instructions, Wheelchair mobility training, Cryotherapy, and Moist heat  PLAN FOR NEXT SESSION:  Continue with functional standing Continue with transfer safety Continue with gait training.   Chyrl London, PT Physical Therapist - [facility]  12:14 PM 06/25/24    OUTPATIENT PHYSICAL THERAPY TREATMENT    Patient Name: Lindsey [name] MRN: [id number] DOB:[birth year], 68 y.o., female Today's Date: 06/25/2024  PCP: Lorel Maxie LABOR, MD  REFERRING PROVIDER: Lorilee Sven SQUIBB, MD   END OF SESSION:   PT End of Session - 06/25/24 0859     Visit Number 47    Number of Visits 55    Date for Recertification  07/02/24    Authorization Type Medicare/Medicaid    Progress Note Due on Visit 50    PT Start Time 0853    PT Stop Time 0933    PT Time Calculation (min) 40 min    Equipment Utilized During Treatment Gait belt    Activity Tolerance Patient tolerated treatment well;No increased pain;Patient limited by fatigue    Behavior During Therapy [facility] for tasks assessed/performed                   Past Medical History:  Diagnosis Date   Aphasia    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 No pertinent surgical history. 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   REFERRING DIAG:  Diagnosis  I69.398,R25.2 (ICD-10-CM) - Spasticity as late effect of cerebrovascular accident (CVA)    THERAPY DIAG:  Muscle weakness (generalized)  Other lack of coordination  Unsteadiness on feet  Difficulty in walking, not elsewhere classified  Abnormality of gait and mobility  Other abnormalities of gait and mobility  Apraxia  Rationale for Evaluation and Treatment: Rehabilitation  SUBJECTIVE:  SUBJECTIVE STATEMENT: Patient reports feeling better this week. Dtr present today and reports she is going to have the lady who sometimes brings Johany to PT to go by her facility and practice standing with Trystyn.   PERTINENT HISTORY:  Daughter present for Evaluation to provide hx. Pt is familiar to this clinic and has been seen for multiple bouts PT since initial CVA in 2021. Pt d/c'ed from PT serviced in April of 2024 with plans to receive PT services through [facility]. Daughter reports that she was only receiving minimal therapy in facility. Reports at least 1 fall in the last 6 months and daughter asked staff to transfer pt with lift since fall.  Daughter would want pt to demonstrate improve safety with transfers to allow bil transfers with reduced use of rail to allow pt to return home for assisted living.    PAIN:  Are you having pain? No  PRECAUTIONS: Fall  RED FLAGS: None   WEIGHT BEARING RESTRICTIONS: No  FALLS: Has patient fallen in last 6 months? Yes. Number of falls 1  LIVING ENVIRONMENT: Lives with: lives with their family and lives in a skilled nursing facility Lives in: Other SNF  Stairs: No Has following equipment at home: Hemi walker and  Wheelchair (manual)  PLOF: Requires assistive device for independence, Needs assistance with ADLs, Needs assistance with homemaking, and WC level currently   PATIENT GOALS: move better - walking or transferring with hemi walking   OBJECTIVE:  Note: Objective measures were completed at Evaluation unless otherwise noted.  DIAGNOSTIC FINDINGS:  Head CT:  IMPRESSION: 1. No acute intracranial abnormality. 2. No acute displaced fracture or traumatic listhesis of the cervical spine.  Cervical CT:  MPRESSION: 1. No acute intracranial abnormality. 2. No acute displaced fracture or traumatic listhesis of the cervical spine.  COGNITION: Overall cognitive status: History of cognitive impairments - at baseline   SENSATION: Light touch: Impaired  Proprioception: Impaired  Able to detect depe pressure   COORDINATION: Increased tone on the RLE   EDEMA:  Mild RLE distal edema   MUSCLE TONE: RLE: Mild and Moderate  MUSCLE LENGTH: Hamstrings: grossly limited   POSTURE: rounded shoulders, forward head, and posterior bias   LOWER EXTREMITY ROM:     Active  Right Eval Left Eval  Hip flexion  Limited to 5 deg beyond 90 in sitting  Knee flexion  90deg in sitting  Knee extension  Lacking 10 deg full extension  Ankle dorsiflexion  none  Ankle plantarflexion  none   (Blank rows = not tested)  LOWER EXTREMITY MMT:    MMT Right Eval Left Eval  Hip flexion  2+  Hip extension    Hip abduction  2  Hip adduction  3  Hip internal rotation    Hip external rotation    Knee flexion  2+  Knee extension  3+  Ankle dorsiflexion  0  Ankle plantarflexion  0  Ankle inversion    Ankle eversion    (Blank rows = not tested)  BED MOBILITY:  Sit to supine Mod A Supine to sit Mod A Rolling to Right Min A Rolling to Left Min A  TRANSFERS: Assistive device utilized: Hemi walker  Sit to stand: Mod A Stand to sit: Mod A Chair to chair: Mod A Floor: unable to perform   CURB:  Level  of Assistance: Total A Assistive device utilized: rail Curb Comments: unable to perform   GAIT: Gait pattern: non-functional constant posterior bias , step to pattern,  decreased stance time- Right, Right hip hike, lateral hip instability, and lateral lean- Left Distance walked: 8ft Assistive device utilized: Hemi walker Level of assistance: Mod A Comments: moderate cues for sequencing of HW and gait pattern in turns   FUNCTIONAL TESTS:  5 times sit to stand: 48 sec with mod Assist from PT  Timed up and go (TUG): unable to perform  10 meter walk test: 6 ft with min-mod assist and HW.  FIST:                                                                                                                               TREATMENT DATE: 06/25/2024    Self care/Home management:  Discussion with patient and her dtr about her home situation and the need for consistent help with standing and knee ROM at home. Dtr reports going to try to have a caregiver go by the facility and work with Elida on standing and flexibility with her RLE.  Discussed that the caregiver can attend PT sessions so can train her to assist with standing and appropriate exercises.    GAIT:  Patient ambulated around 75  feet with hemi walker, CGA and no follow with w/c today and patient able to advance RLE without prompting and no LOB.  Patient later ambulated 10 feet forward and then bwd x 3, Close CGA specifically with retro gait - some difficulty clearing R LE due to ext tone.   TA: - Sit to stand - Continuing with focus on consistency with hand placement and ability to reach back with stand to sit x 5 reps -VC for technique - mostly for reaching back  TE:  PROM to R knee - Flex and Ext and R hip circles and IR/ER To improve flexibility             PATIENT EDUCATION: Education details: .  Pt educated throughout session about proper posture and technique with exercises. Improved exercise technique, movement  at target joints, use of target muscles after min to mod verbal, visual, tactile cues Interpretation of outcome measure; need for continued progress to justify continued PT services.   Person educated: Patient Education method: Explanation Education comprehension: verbalized understanding  HOME EXERCISE PROGRAM: Access Code: [email or web address] URL: [email or web address] Date: 12/05/2023 Prepared by: Massie Dollar  Exercises - Sit to Stand with Counter Support  - 1 x daily - 3-4 x weekly - 2 sets - 10 reps - Seated Long Arc Quad  - 1 x daily - 3-4 x weekly - 2 sets - 10 reps - 2 hold - Seated March  - 1 x daily - 3-4 x weekly - 2 sets - 10 reps - 2 hold - Standing Hip Abduction with Counter Support  - 1 x daily - 7 x weekly - 3 sets - 5 reps - Wide Stance with Counter Support  - 1 x daily - 7 x weekly - 3 sets - 2 reps - 30  hold - Seated Hip Abduction  - 1 x daily - 7 x weekly - 3 sets - 10 reps - Supine Bridge  - 1 x daily - 7 x weekly - 3 sets - 10 reps - Supine Short Arc Quad  - 1 x daily - 7 x weekly - 3 sets - 10 reps - Bent Knee Fallouts  - 1 x daily - 7 x weekly - 3 sets - 10 reps - Small Range Straight Leg Raise  - 1 x daily - 7 x weekly - 3 sets - 10 reps - Supine Heel Slide  - 1 x daily - 7 x weekly - 3 sets - 10 reps  GOALS: Goals reviewed with patient? Yes  SHORT TERM GOALS: Target date: 05/10/2024  Patient will be independent in home exercise program to improve strength/mobility for better functional independence with ADLs. Baseline: to be provided on visit 2: 12/28/2023- No caregiver with patient to determine if compliant with HEP Goal  5/13: daugher states that they are completing some exercises almost daily; 04/09/2024= No caregiver present today to f/u with compliance of HEP- Patient verbalized yes and stated standing, moving my legs.  status: IN PROGRESS  LONG TERM GOALS: Target date: 07/02/2024  2.  Patient (> 110 years old) will complete five times sit to stand  test by 15 seconds and only min assist indicating an increased LE strength and improved balance. Baseline: 48 with mod assist from PT; 12/28/2023= 44 sec from EOM at lowest postion (approx 21 in) with Left UE support and CGA to min A from PT 5/13: 28.80 sec with HW from [facility]; min assist from PT for full erect standing; 04/09/2024= 26.56 sec with HW in front- pushing up using LUE on armrest of w/c- Min Assist for full erect standing 8/26: 25.41 sec with UE pushing from arm rest and reaching for rail on wall; min a to mod assist from PT throughout all repetitions for adequate anterior weight shift  Goal status: Not Met   3.  Patient will increase FIST score by > 6 points to demonstrate decreased fall risk during functional activities Baseline: 37 5/13: 46/56 Goal status: MET  4.  Patient will complete 10 meter walk test to with HW in less than 1 minute with min assist as to improve gait speed for better community ambulation and to reduce fall risk. Baseline: 6 ft with HW and mod assist; 12/28/2023=18 feet with HW- mod assist  5/13: 2:11 min with HW, min-mod assist. 04/09/2024= Patient ambulated approx 22 feet using HW with Min assist and mod VC for gait sequencing-then experienced LOB requiring Mod Assist to maintain standing. 8/26: 2:53min (0 .54m/s) with HW and min fading to mod assist to advance the RLE with fatigue (165 sec); 06/06/2024= 0.85 m/s sec avg with HM with CGA and close w/c follow. Goal status: PROGRESSING  5.  Patient will perform stand pivot transfer to and from [facility] with HW and CGA for safety to allow safe transfer to toilet with daughter without use of rail on the wall. Baseline: mod assist and heavy use of rail. 12/28/2023= Min to Mod assist with repetitive VC for technique 5/13: min assist overall with HW; mod assist initially both directions  to prevent posterior LOB. 04/09/2024- Min assist physically in both directions yet increased VC for safety- if provided more time- patient able to make  correct movements 75% of time during transfers- still inconsistent with reaching back and pivoting on Right LE  8/26: mod assist from  PT to the R and L on this day due to posterior LOB  Goal status: Progressing  6.  Patient will improve bed mobility to min assist only to reduce care giver burden and improve safety of transfers  Baseline: mod-max assist 5/13: Min-mod assist for sit<>supine. Min assist only for RLE management for rolling; 04/09/2024- unable to test due to time  8/26. Mod assist for sit<>supine movement at trunk and RLE and min assist for rolling tasks on this day.  Goal status: ONGOING  ASSESSMENT:  CLINICAL IMPRESSION:  Patient returned to form with much improved effort today. She was able to advance her RLE better today as well and increased overall gait endurance in presence of her daughter. Less overall Cues with gait sequencing and patient exhibited no LOB walking either forward or backward today. Pt will benefit from continued skilled PT to address strength and balance deficits to improve safety for transfers, reduce care giver burden, and allow return to PLOF and possible return home from Assisted living.   OBJECTIVE IMPAIRMENTS: Abnormal gait, cardiopulmonary status limiting activity, decreased activity tolerance, decreased balance, decreased cognition, decreased coordination, decreased endurance, decreased knowledge of use of DME, decreased mobility, difficulty walking, decreased ROM, decreased strength, hypomobility, increased muscle spasms, impaired flexibility, impaired sensation, impaired tone, impaired UE functional use, improper body mechanics, and postural dysfunction.   ACTIVITY LIMITATIONS: carrying, lifting, bending, standing, squatting, stairs, transfers, bed mobility, continence, bathing, toileting, dressing, reach over head, hygiene/grooming, and locomotion level  PARTICIPATION LIMITATIONS: cleaning, interpersonal relationship, shopping, and community  activity  PERSONAL FACTORS: 3+ comorbidities: HTN, chronic CVA, Afib, Lupus  are also affecting patient's functional outcome.   REHAB POTENTIAL: Fair chronic CVA with poor progress in prior interventions with this clinic  CLINICAL DECISION MAKING: Unstable/unpredictable  EVALUATION COMPLEXITY: High  PLAN:  PT FREQUENCY: 1-2x/week  PT DURATION: 12 weeks  PLANNED INTERVENTIONS: 97110-Therapeutic exercises, 97530- Therapeutic activity, [id number]- Neuromuscular re-education, 97535- Self Care, 02859- Manual therapy, [phone]- Gait training, [phone]- Orthotic Fit/training, [phone]- Aquatic Therapy, [phone]- Splinting, 97014- Electrical stimulation (unattended), [phone]- Electrical stimulation (manual), Patient/Family education, Balance training, Stair training, Joint mobilization, Vestibular training, Visual/preceptual remediation/compensation, Cognitive remediation, DME instructions, Wheelchair mobility training, Cryotherapy, and Moist heat  PLAN FOR NEXT SESSION:  Continue with functional standing Continue with transfer safety Continue with gait training.   Chyrl London, PT Physical Therapist - [place]  [facility] ([place])  12:14 PM

## 2024-06-25 ENCOUNTER — Ambulatory Visit: Admitting: Occupational Therapy

## 2024-06-25 ENCOUNTER — Encounter: Admitting: Speech Pathology

## 2024-06-25 ENCOUNTER — Ambulatory Visit

## 2024-06-25 DIAGNOSIS — R262 Difficulty in walking, not elsewhere classified: Secondary | ICD-10-CM

## 2024-06-25 DIAGNOSIS — R2681 Unsteadiness on feet: Secondary | ICD-10-CM

## 2024-06-25 DIAGNOSIS — M6281 Muscle weakness (generalized): Secondary | ICD-10-CM

## 2024-06-25 DIAGNOSIS — R2689 Other abnormalities of gait and mobility: Secondary | ICD-10-CM

## 2024-06-25 DIAGNOSIS — R482 Apraxia: Secondary | ICD-10-CM

## 2024-06-25 DIAGNOSIS — R278 Other lack of coordination: Secondary | ICD-10-CM

## 2024-06-25 DIAGNOSIS — R269 Unspecified abnormalities of gait and mobility: Secondary | ICD-10-CM

## 2024-06-27 ENCOUNTER — Ambulatory Visit

## 2024-06-27 ENCOUNTER — Ambulatory Visit: Admitting: Occupational Therapy

## 2024-06-27 ENCOUNTER — Encounter: Admitting: Speech Pathology

## 2024-06-27 NOTE — Therapy (Incomplete)
 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   REFERRING DIAG:  Diagnosis  I69.398,R25.2 (ICD-10-CM) - Spasticity as late effect of cerebrovascular accident (CVA)    THERAPY DIAG:  No diagnosis found.  Rationale for Evaluation and Treatment: Rehabilitation  SUBJECTIVE:                                                                                                                                                                                              SUBJECTIVE STATEMENT: Patient reports no changes since last visit.    PERTINENT HISTORY:  Daughter present for Evaluation to provide hx. Pt is familiar to this clinic and has been seen for multiple bouts PT since initial CVA in 2021. Pt d/c'ed from PT serviced in April of 2024 with plans to receive PT services through [facility]. Daughter reports that she was only receiving minimal therapy in facility. Reports at least 1 fall in the last 6 months and daughter asked staff to transfer pt with lift since fall.  Daughter would want pt to demonstrate improve safety with transfers to  allow bil transfers with reduced use of rail to allow pt to return home for assisted living.    PAIN:  Are you having pain? No  PRECAUTIONS: Fall  RED FLAGS: None   WEIGHT BEARING RESTRICTIONS: No  FALLS: Has patient fallen in last 6 months? Yes. Number of falls 1  LIVING ENVIRONMENT: Lives with: lives with their family and lives in a skilled nursing facility Lives in: Other SNF  Stairs: No Has following equipment at home: Hemi walker and Wheelchair (manual)  PLOF: Requires assistive device for independence, Needs assistance with ADLs, Needs assistance with homemaking, and WC level currently   PATIENT GOALS: move better - walking or transferring with hemi walking   OBJECTIVE:  Note: Objective measures were completed at Evaluation unless otherwise noted.  DIAGNOSTIC FINDINGS:  Head CT:  IMPRESSION: 1. No acute intracranial abnormality. 2. No acute displaced fracture or traumatic listhesis of the cervical spine.  Cervical CT:  MPRESSION: 1. No acute intracranial abnormality. 2. No acute displaced fracture or traumatic listhesis of the cervical spine.  COGNITION: Overall cognitive status: History of cognitive impairments - at baseline   SENSATION: Light touch: Impaired  Proprioception: Impaired  Able to detect depe pressure   COORDINATION: Increased tone on the RLE   EDEMA:   Mild RLE distal edema   MUSCLE TONE: RLE: Mild and Moderate  MUSCLE LENGTH: Hamstrings: grossly limited   POSTURE: rounded shoulders, forward head, and posterior bias   LOWER EXTREMITY ROM:     Active  Right Eval Left Eval  Hip flexion  Limited to 5 deg beyond 90 in sitting  Knee flexion  90deg in sitting  Knee extension  Lacking 10 deg full extension  Ankle dorsiflexion  none  Ankle plantarflexion  none   (Blank rows = not tested)  LOWER EXTREMITY MMT:    MMT Right Eval Left Eval  Hip flexion  2+  Hip extension    Hip abduction  2  Hip adduction  3  Hip internal rotation    Hip external rotation    Knee flexion  2+  Knee extension  3+  Ankle dorsiflexion  0  Ankle plantarflexion  0  Ankle inversion    Ankle eversion    (Blank rows = not tested)  BED MOBILITY:  Sit to supine Mod A Supine to sit Mod A Rolling to Right Min A Rolling to Left Min A  TRANSFERS: Assistive device utilized: Hemi walker  Sit to stand: Mod A Stand to sit: Mod A Chair to chair: Mod A Floor: unable to perform   CURB:  Level of Assistance: Total A Assistive device utilized: rail Curb Comments: unable to perform   GAIT: Gait pattern: non-functional constant posterior bias , step to pattern, decreased stance time- Right, Right hip hike, lateral hip instability, and lateral lean- Left Distance walked: 28ft Assistive device utilized: Hemi walker Level of assistance: Mod A Comments: moderate cues for sequencing of HW and gait pattern in turns   FUNCTIONAL TESTS:  5 times sit to stand: 48 sec with mod Assist from PT  Timed up and go (TUG): unable to perform  10 meter walk test: 6 ft with min-mod assist and HW.  FIST:  TREATMENT DATE: 06/27/2024   GAIT:  Patient ambulated 15 feet (from 1 stationary chair to another focusing on standing up without  hesitating and then walk to another chair- focusing on turning 180 feet then backing up) - patient performed 4 rounds today- Focusing on increasing step length, standing erect, and pivoting/turning feet. Patient became emotional at times today- frustrated with turning and backing up and on last round requested to stop today- requesting to end and go home. She denied any pain but requested to end session.   TA: - Sit to stand - Continuing with focus on consistency with hand placement and ability to reach back with stand to sit x 10 reps -VC for technique - mostly for reaching back. - Chair manuevering- swinging to left in transport chair back to right x 5.              PATIENT EDUCATION: Education details: .  Pt educated throughout session about proper posture and technique with exercises. Improved exercise technique, movement at target joints, use of target muscles after min to mod verbal, visual, tactile cues Interpretation of outcome measure; need for continued progress to justify continued PT services.   Person educated: Patient Education method: Explanation Education comprehension: verbalized understanding  HOME EXERCISE PROGRAM: Access Code: [email or web address] URL: [email or web address] Date: 12/05/2023 Prepared by: Massie Dollar  Exercises - Sit to Stand with Counter Support  - 1 x daily - 3-4 x weekly - 2 sets - 10 reps - Seated Long Arc Quad  - 1 x daily - 3-4 x weekly - 2 sets - 10 reps - 2 hold - Seated March  - 1 x daily - 3-4 x weekly - 2 sets - 10 reps - 2 hold - Standing Hip Abduction with Counter Support  - 1 x daily - 7 x weekly - 3 sets - 5 reps - Wide Stance with Counter Support  - 1 x daily - 7 x weekly - 3 sets - 2 reps - 30 hold - Seated Hip Abduction  - 1 x daily - 7 x weekly - 3 sets - 10 reps - Supine Bridge  - 1 x daily - 7 x weekly - 3 sets - 10 reps - Supine Short Arc Quad  - 1 x daily - 7 x weekly - 3 sets - 10 reps - Bent Knee Fallouts  - 1 x  daily - 7 x weekly - 3 sets - 10 reps - Small Range Straight Leg Raise  - 1 x daily - 7 x weekly - 3 sets - 10 reps - Supine Heel Slide  - 1 x daily - 7 x weekly - 3 sets - 10 reps  GOALS: Goals reviewed with patient? Yes  SHORT TERM GOALS: Target date: 05/10/2024  Patient will be independent in home exercise program to improve strength/mobility for better functional independence with ADLs. Baseline: to be provided on visit 2: 12/28/2023- No caregiver with patient to determine if compliant with HEP Goal  5/13: daugher states that they are completing some exercises almost daily; 04/09/2024= No caregiver present today to f/u with compliance of HEP- Patient verbalized yes and stated standing, moving my legs.  status: IN PROGRESS  LONG TERM GOALS: Target date: 07/02/2024  2.  Patient (> 81 years old) will complete five times sit to stand test by 15 seconds and only min assist indicating an increased LE strength and improved balance. Baseline: 48 with mod assist from PT; 12/28/2023= 44 sec from EOM at lowest postion (approx  21 in) with Left UE support and CGA to min A from PT 5/13: 28.80 sec with HW from [facility]; min assist from PT for full erect standing; 04/09/2024= 26.56 sec with HW in front- pushing up using LUE on armrest of w/c- Min Assist for full erect standing 8/26: 25.41 sec with UE pushing from arm rest and reaching for rail on wall; min a to mod assist from PT throughout all repetitions for adequate anterior weight shift  Goal status: Not Met   3.  Patient will increase FIST score by > 6 points to demonstrate decreased fall risk during functional activities Baseline: 37 5/13: 46/56 Goal status: MET  4.  Patient will complete 10 meter walk test to with HW in less than 1 minute with min assist as to improve gait speed for better community ambulation and to reduce fall risk. Baseline: 6 ft with HW and mod assist; 12/28/2023=18 feet with HW- mod assist  5/13: 2:11 min with HW, min-mod assist.  04/09/2024= Patient ambulated approx 22 feet using HW with Min assist and mod VC for gait sequencing-then experienced LOB requiring Mod Assist to maintain standing. 8/26: 2:8min (0 .70m/s) with HW and min fading to mod assist to advance the RLE with fatigue (165 sec); 06/06/2024= 0.85 m/s sec avg with HM with CGA and close w/c follow. Goal status: PROGRESSING  5.  Patient will perform stand pivot transfer to and from [facility] with HW and CGA for safety to allow safe transfer to toilet with daughter without use of rail on the wall. Baseline: mod assist and heavy use of rail. 12/28/2023= Min to Mod assist with repetitive VC for technique 5/13: min assist overall with HW; mod assist initially both directions  to prevent posterior LOB. 04/09/2024- Min assist physically in both directions yet increased VC for safety- if provided more time- patient able to make correct movements 75% of time during transfers- still inconsistent with reaching back and pivoting on Right LE  8/26: mod assist from PT to the R and L on this day due to posterior LOB  Goal status: Progressing  6.  Patient will improve bed mobility to min assist only to reduce care giver burden and improve safety of transfers  Baseline: mod-max assist 5/13: Min-mod assist for sit<>supine. Min assist only for RLE management for rolling; 04/09/2024- unable to test due to time  8/26. Mod assist for sit<>supine movement at trunk and RLE and min assist for rolling tasks on this day.  Goal status: ONGOING  ASSESSMENT:  CLINICAL IMPRESSION:  Patient was self limiting today requested to end session early due to frustration with her performance. She was responsive to [facility] for gait sequencing yet struggled at times with efficiency and coordination with turning 180 deg to sit down. She was inconsistent but eventually requested to end session. She did not specify exactly why- she did deny any pain or excessive fatigue.  Pt will benefit from continued skilled PT to address  strength and balance deficits to improve safety for transfers, reduce care giver burden, and allow return to PLOF and possible return home from Assisted living.   OBJECTIVE IMPAIRMENTS: Abnormal gait, cardiopulmonary status limiting activity, decreased activity tolerance, decreased balance, decreased cognition, decreased coordination, decreased endurance, decreased knowledge of use of DME, decreased mobility, difficulty walking, decreased ROM, decreased strength, hypomobility, increased muscle spasms, impaired flexibility, impaired sensation, impaired tone, impaired UE functional use, improper body mechanics, and postural dysfunction.   ACTIVITY LIMITATIONS: carrying, lifting, bending, standing, squatting, stairs, transfers, bed mobility,  continence, bathing, toileting, dressing, reach over head, hygiene/grooming, and locomotion level  PARTICIPATION LIMITATIONS: cleaning, interpersonal relationship, shopping, and community activity  PERSONAL FACTORS: 3+ comorbidities: HTN, chronic CVA, Afib, Lupus  are also affecting patient's functional outcome.   REHAB POTENTIAL: Fair chronic CVA with poor progress in prior interventions with this clinic  CLINICAL DECISION MAKING: Unstable/unpredictable  EVALUATION COMPLEXITY: High  PLAN:  PT FREQUENCY: 1-2x/week  PT DURATION: 12 weeks  PLANNED INTERVENTIONS: 97110-Therapeutic exercises, 97530- Therapeutic activity, [id number]- Neuromuscular re-education, 97535- Self Care, [id number]- Manual therapy, [phone]- Gait training, [phone]- Orthotic Fit/training, [phone]- Aquatic Therapy, [phone]- Splinting, [id number]- Electrical stimulation (unattended), [phone]- Electrical stimulation (manual), Patient/Family education, Balance training, Stair training, Joint mobilization, Vestibular training, Visual/preceptual remediation/compensation, Cognitive remediation, DME instructions, Wheelchair mobility training, Cryotherapy, and Moist heat  PLAN FOR NEXT SESSION:  Continue with functional  standing Continue with transfer safety Continue with gait training.   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   REFERRING DIAG:  Diagnosis  I69.398,R25.2 (ICD-10-CM) - Spasticity as late effect of cerebrovascular accident (CVA)    THERAPY DIAG:  No diagnosis found.  Rationale for Evaluation and Treatment:  Rehabilitation  SUBJECTIVE:  SUBJECTIVE STATEMENT: Patient reports doing okay. Dtr present today and states she is going to have caregiver assist patient more at facility if able.   PERTINENT HISTORY:  Daughter present for Evaluation to provide hx. Pt is familiar to this clinic and has been seen for multiple bouts PT since initial CVA in 2021. Pt d/c'ed from PT serviced in April of 2024 with plans to receive PT services through [facility]. Daughter reports that she was only receiving minimal therapy in facility. Reports at least 1 fall in the last 6 months and daughter asked staff to transfer pt with lift since fall.  Daughter would want pt to demonstrate improve safety with transfers to allow bil transfers with reduced use of rail to allow pt to return home for assisted living.    PAIN:  Are you having pain? No  PRECAUTIONS: Fall  RED FLAGS: None   WEIGHT BEARING RESTRICTIONS: No  FALLS: Has patient fallen in last 6 months? Yes. Number of falls 1  LIVING ENVIRONMENT: Lives with: lives with their family and lives in a skilled nursing facility Lives in: Other SNF  Stairs: No Has following equipment at home: Hemi walker and Wheelchair (manual)  PLOF: Requires assistive device for independence, Needs assistance with ADLs, Needs assistance with homemaking, and WC level currently   PATIENT GOALS: move better - walking or transferring with hemi walking   OBJECTIVE:  Note: Objective measures were completed at Evaluation unless otherwise noted.  DIAGNOSTIC FINDINGS:  Head CT:  IMPRESSION: 1. No acute intracranial abnormality. 2. No acute displaced fracture or traumatic listhesis of the cervical spine.  Cervical CT:  MPRESSION: 1. No acute intracranial abnormality. 2. No acute displaced  fracture or traumatic listhesis of the cervical spine.  COGNITION: Overall cognitive status: History of cognitive impairments - at baseline   SENSATION: Light touch: Impaired  Proprioception: Impaired  Able to detect depe pressure   COORDINATION: Increased tone on the RLE   EDEMA:  Mild RLE distal edema   MUSCLE TONE: RLE: Mild and Moderate  MUSCLE LENGTH: Hamstrings: grossly limited   POSTURE: rounded shoulders, forward head, and posterior bias   LOWER EXTREMITY ROM:     Active  Right Eval Left Eval  Hip flexion  Limited to 5 deg beyond 90 in sitting  Knee flexion  90deg in sitting  Knee extension  Lacking 10 deg full extension  Ankle dorsiflexion  none  Ankle plantarflexion  none   (Blank rows = not tested)  LOWER EXTREMITY MMT:    MMT Right Eval Left Eval  Hip flexion  2+  Hip extension    Hip abduction  2  Hip adduction  3  Hip internal rotation    Hip external rotation    Knee flexion  2+  Knee extension  3+  Ankle dorsiflexion  0  Ankle plantarflexion  0  Ankle inversion    Ankle eversion    (Blank rows = not tested)  BED MOBILITY:  Sit to supine Mod A Supine to sit Mod A Rolling to Right Min A Rolling to Left Min A  TRANSFERS: Assistive device utilized: Hemi walker  Sit to stand: Mod A Stand to sit: Mod A Chair to chair: Mod A Floor: unable to perform   CURB:  Level of Assistance: Total A Assistive device utilized: rail Curb Comments: unable to perform   GAIT: Gait pattern: non-functional constant posterior bias , step to pattern, decreased stance time- Right, Right hip hike, lateral hip instability, and lateral lean-  Left Distance walked: 62ft Assistive device utilized: Hemi walker Level of assistance: Mod A Comments: moderate cues for sequencing of HW and gait pattern in turns   FUNCTIONAL TESTS:  5 times sit to stand: 48 sec with mod Assist from PT  Timed up and go (TUG): unable to perform  10 meter walk test: 6 ft with  min-mod assist and HW.  FIST:                                                                                                                               TREATMENT DATE: 06/27/2024  TA: - Sit to stand - focusing on consistency with hand placement- Min VC for technique early but patient able to perform 15 reps (slowly) focusing on anterior weight shift -Dynamic lateral weight shifting  while standing x 20 reps x 2.  -Feet staggered then some A/P weight shifting x 20 reps each direction -Static stand without UE support - x 5 min - VC/Reminders to stand tall and look ahead as well as intermittent cues to shift over to RLE   -Forward/backward stepping at support bar with LUE support-  max difficulty swinging RLE back into hip ext and increased time and effort to swing forward today as well.  Step tapping onto 2 block with LUE Support- x 15 reps each- Max difficulty clearing RLE off floor and achieving adequate height to step tap due to extensor tone.    PATIENT EDUCATION: Education details: .  Pt educated throughout session about proper posture and technique with exercises. Improved exercise technique, movement at target joints, use of target muscles after min to mod verbal, visual, tactile cues Interpretation of outcome measure; need for continued progress to justify continued PT services.   Person educated: Patient Education method: Explanation Education comprehension: verbalized understanding  HOME EXERCISE PROGRAM: Access Code: [email or web address] URL: [email or web address] Date: 12/05/2023 Prepared by: Massie Dollar  Exercises - Sit to Stand with Counter Support  - 1 x daily - 3-4 x weekly - 2 sets - 10 reps - Seated Long Arc Quad  - 1 x daily - 3-4 x weekly - 2 sets - 10 reps - 2 hold - Seated March  - 1 x daily - 3-4 x weekly - 2 sets - 10 reps - 2 hold - Standing Hip Abduction with Counter Support  - 1 x daily - 7 x weekly - 3 sets - 5 reps - Wide Stance with Counter  Support  - 1 x daily - 7 x weekly - 3 sets - 2 reps - 30 hold - Seated Hip Abduction  - 1 x daily - 7 x weekly - 3 sets - 10 reps - Supine Bridge  - 1 x daily - 7 x weekly - 3 sets - 10 reps - Supine Short Arc Quad  - 1 x daily - 7 x weekly - 3 sets - 10 reps - Bent Knee Fallouts  - 1 x daily -  7 x weekly - 3 sets - 10 reps - Small Range Straight Leg Raise  - 1 x daily - 7 x weekly - 3 sets - 10 reps - Supine Heel Slide  - 1 x daily - 7 x weekly - 3 sets - 10 reps  GOALS: Goals reviewed with patient? Yes  SHORT TERM GOALS: Target date: 05/10/2024  Patient will be independent in home exercise program to improve strength/mobility for better functional independence with ADLs. Baseline: to be provided on visit 2: 12/28/2023- No caregiver with patient to determine if compliant with HEP Goal  5/13: daugher states that they are completing some exercises almost daily; 04/09/2024= No caregiver present today to f/u with compliance of HEP- Patient verbalized yes and stated standing, moving my legs.  status: IN PROGRESS  LONG TERM GOALS: Target date: 07/02/2024  2.  Patient (> 70 years old) will complete five times sit to stand test by 15 seconds and only min assist indicating an increased LE strength and improved balance. Baseline: 48 with mod assist from PT; 12/28/2023= 44 sec from EOM at lowest postion (approx 21 in) with Left UE support and CGA to min A from PT 5/13: 28.80 sec with HW from [facility]; min assist from PT for full erect standing; 04/09/2024= 26.56 sec with HW in front- pushing up using LUE on armrest of w/c- Min Assist for full erect standing 8/26: 25.41 sec with UE pushing from arm rest and reaching for rail on wall; min a to mod assist from PT throughout all repetitions for adequate anterior weight shift  Goal status: Not Met   3.  Patient will increase FIST score by > 6 points to demonstrate decreased fall risk during functional activities Baseline: 37 5/13: 46/56 Goal status: MET  4.   Patient will complete 10 meter walk test to with HW in less than 1 minute with min assist as to improve gait speed for better community ambulation and to reduce fall risk. Baseline: 6 ft with HW and mod assist; 12/28/2023=18 feet with HW- mod assist  5/13: 2:11 min with HW, min-mod assist. 04/09/2024= Patient ambulated approx 22 feet using HW with Min assist and mod VC for gait sequencing-then experienced LOB requiring Mod Assist to maintain standing. 8/26: 2:74min (0 .50m/s) with HW and min fading to mod assist to advance the RLE with fatigue (165 sec) Goal status: PROGRESSING  5.  Patient will perform stand pivot transfer to and from [facility] with HW and CGA for safety to allow safe transfer to toilet with daughter without use of rail on the wall. Baseline: mod assist and heavy use of rail. 12/28/2023= Min to Mod assist with repetitive VC for technique 5/13: min assist overall with HW; mod assist initially both directions  to prevent posterior LOB. 04/09/2024- Min assist physically in both directions yet increased VC for safety- if provided more time- patient able to make correct movements 75% of time during transfers- still inconsistent with reaching back and pivoting on Right LE  8/26: mod assist from PT to the R and L on this day due to posterior LOB  Goal status: Progressing  6.  Patient will improve bed mobility to min assist only to reduce care giver burden and improve safety of transfers  Baseline: mod-max assist 5/13: Min-mod assist for sit<>supine. Min assist only for RLE management for rolling; 04/09/2024- unable to test due to time  8/26. Mod assist for sit<>supine movement at trunk and RLE and min assist for rolling tasks on this  day.  Goal status: ONGOING  ASSESSMENT:  CLINICAL IMPRESSION:  Patient appears very frustrated today with more difficulty negotiating/moving RLE. She was visibly upset and shaking her head more today. Attempt to put her in more manageable positions like just dynamic  weight shifting or static standing then back to more dynamic activities but her RLE was more sluggish and she had more difficulty moving her LE overall today. Will contact her Daughter to see how much help she can receive at the facility for carryover.  Pt will benefit from continued skilled PT to address strength and balance deficits to improve safety for transfers, reduce care giver burden, and allow return to PLOF and possible return home from Assisted living.   OBJECTIVE IMPAIRMENTS: Abnormal gait, cardiopulmonary status limiting activity, decreased activity tolerance, decreased balance, decreased cognition, decreased coordination, decreased endurance, decreased knowledge of use of DME, decreased mobility, difficulty walking, decreased ROM, decreased strength, hypomobility, increased muscle spasms, impaired flexibility, impaired sensation, impaired tone, impaired UE functional use, improper body mechanics, and postural dysfunction.   ACTIVITY LIMITATIONS: carrying, lifting, bending, standing, squatting, stairs, transfers, bed mobility, continence, bathing, toileting, dressing, reach over head, hygiene/grooming, and locomotion level  PARTICIPATION LIMITATIONS: cleaning, interpersonal relationship, shopping, and community activity  PERSONAL FACTORS: 3+ comorbidities: HTN, chronic CVA, Afib, Lupus  are also affecting patient's functional outcome.   REHAB POTENTIAL: Fair chronic CVA with poor progress in prior interventions with this clinic  CLINICAL DECISION MAKING: Unstable/unpredictable  EVALUATION COMPLEXITY: High  PLAN:  PT FREQUENCY: 1-2x/week  PT DURATION: 12 weeks  PLANNED INTERVENTIONS: 97110-Therapeutic exercises, 97530- Therapeutic activity, [id number]- Neuromuscular re-education, 97535- Self Care, [id number]- Manual therapy, [phone]- Gait training, [phone]- Orthotic Fit/training, [phone]- Aquatic Therapy, [phone]- Splinting, 97014- Electrical stimulation (unattended), [phone]- Electrical stimulation  (manual), Patient/Family education, Balance training, Stair training, Joint mobilization, Vestibular training, Visual/preceptual remediation/compensation, Cognitive remediation, DME instructions, Wheelchair mobility training, Cryotherapy, and Moist heat  PLAN FOR NEXT SESSION:  Continue with functional standing Continue with transfer safety Continue with gait training.   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   REFERRING DIAG:  Diagnosis  I69.398,R25.2 (ICD-10-CM) - Spasticity as late effect of cerebrovascular accident (CVA)    THERAPY DIAG:  No diagnosis found.  Rationale for Evaluation and Treatment: Rehabilitation  SUBJECTIVE:                                                                                                                                                                                             SUBJECTIVE STATEMENT: Patient reports feeling better this week. Dtr present today and reports she is going to have the lady who sometimes brings Lindsey [name] to PT to go by her facility and practice standing with Kerrianne.   PERTINENT HISTORY:  Daughter present for Evaluation to provide hx. Pt is familiar to this clinic and has been seen for multiple bouts PT since initial CVA in 2021. Pt d/c'ed from PT serviced in April of 2024 with plans to receive PT services through [facility]. Daughter reports that she was only receiving minimal therapy in facility. Reports at least 1 fall in the last 6 months and daughter asked staff to transfer pt with lift since fall.  Daughter would want pt to demonstrate improve safety with transfers to allow bil transfers with reduced use of rail to allow pt to return home for assisted living.    PAIN:  Are you having pain? No  PRECAUTIONS: Fall  RED FLAGS: None   WEIGHT BEARING RESTRICTIONS: No  FALLS: Has patient fallen in last 6 months? Yes. Number of falls 1  LIVING ENVIRONMENT: Lives with: lives with their family and lives in a skilled nursing facility Lives in: Other SNF  Stairs: No Has following equipment at home: Hemi walker and Wheelchair (manual)  PLOF: Requires assistive device for independence, Needs assistance with ADLs, Needs assistance with homemaking, and WC level currently   PATIENT GOALS: move better - walking or transferring with hemi walking    OBJECTIVE:  Note: Objective measures were completed at Evaluation unless otherwise noted.  DIAGNOSTIC FINDINGS:  Head CT:  IMPRESSION: 1. No acute intracranial abnormality. 2. No acute displaced fracture or traumatic listhesis of the cervical spine.  Cervical CT:  MPRESSION: 1. No acute intracranial abnormality. 2. No acute displaced fracture or traumatic listhesis of the cervical spine.  COGNITION: Overall cognitive status: History of cognitive impairments - at baseline   SENSATION: Light touch: Impaired  Proprioception: Impaired  Able to detect depe pressure  COORDINATION: Increased tone on the RLE   EDEMA:  Mild RLE distal edema   MUSCLE TONE: RLE: Mild and Moderate  MUSCLE LENGTH: Hamstrings: grossly limited   POSTURE: rounded shoulders, forward head, and posterior bias   LOWER EXTREMITY ROM:     Active  Right Eval Left Eval  Hip flexion  Limited to 5 deg beyond 90 in sitting  Knee flexion  90deg in sitting  Knee extension  Lacking 10 deg full extension  Ankle dorsiflexion  none  Ankle plantarflexion  none   (Blank rows = not tested)  LOWER EXTREMITY MMT:    MMT Right Eval Left Eval  Hip flexion  2+  Hip extension    Hip abduction  2  Hip adduction  3  Hip internal rotation    Hip external rotation    Knee flexion  2+  Knee extension  3+  Ankle dorsiflexion  0  Ankle plantarflexion  0  Ankle inversion    Ankle eversion    (Blank rows = not tested)  BED MOBILITY:  Sit to supine Mod A Supine to sit Mod A Rolling to Right Min A Rolling to Left Min A  TRANSFERS: Assistive device utilized: Hemi walker  Sit to stand: Mod A Stand to sit: Mod A Chair to chair: Mod A Floor: unable to perform   CURB:  Level of Assistance: Total A Assistive device utilized: rail Curb Comments: unable to perform   GAIT: Gait pattern: non-functional constant posterior bias , step to pattern, decreased stance time- Right, Right hip hike, lateral hip  instability, and lateral lean- Left Distance walked: 9ft Assistive device utilized: Hemi walker Level of assistance: Mod A Comments: moderate cues for sequencing of HW and gait pattern in turns   FUNCTIONAL TESTS:  5 times sit to stand: 48 sec with mod Assist from PT  Timed up and go (TUG): unable to perform  10 meter walk test: 6 ft with min-mod assist and HW.  FIST:                                                                                                                               TREATMENT DATE: 06/27/2024    Self care/Home management:  Discussion with patient and her dtr about her home situation and the need for consistent help with standing and knee ROM at home. Dtr reports going to try to have a caregiver go by the facility and work with Elida on standing and flexibility with her RLE.  Discussed that the caregiver can attend PT sessions so can train her to assist with standing and appropriate exercises.    GAIT:  Patient ambulated around 75  feet with hemi walker, CGA and no follow with w/c today and patient able to advance RLE without prompting and no LOB.  Patient later ambulated 10 feet forward and then bwd x 3, Close CGA specifically with retro gait - some difficulty clearing R LE due  to ext tone.   TA: - Sit to stand - Continuing with focus on consistency with hand placement and ability to reach back with stand to sit x 5 reps -VC for technique - mostly for reaching back  TE:  PROM to R knee - Flex and Ext and R hip circles and IR/ER To improve flexibility             PATIENT EDUCATION: Education details: .  Pt educated throughout session about proper posture and technique with exercises. Improved exercise technique, movement at target joints, use of target muscles after min to mod verbal, visual, tactile cues Interpretation of outcome measure; need for continued progress to justify continued PT services.   Person educated: Patient Education method:  Explanation Education comprehension: verbalized understanding  HOME EXERCISE PROGRAM: Access Code: [email or web address] URL: [email or web address] Date: 12/05/2023 Prepared by: Massie Dollar  Exercises - Sit to Stand with Counter Support  - 1 x daily - 3-4 x weekly - 2 sets - 10 reps - Seated Long Arc Quad  - 1 x daily - 3-4 x weekly - 2 sets - 10 reps - 2 hold - Seated March  - 1 x daily - 3-4 x weekly - 2 sets - 10 reps - 2 hold - Standing Hip Abduction with Counter Support  - 1 x daily - 7 x weekly - 3 sets - 5 reps - Wide Stance with Counter Support  - 1 x daily - 7 x weekly - 3 sets - 2 reps - 30 hold - Seated Hip Abduction  - 1 x daily - 7 x weekly - 3 sets - 10 reps - Supine Bridge  - 1 x daily - 7 x weekly - 3 sets - 10 reps - Supine Short Arc Quad  - 1 x daily - 7 x weekly - 3 sets - 10 reps - Bent Knee Fallouts  - 1 x daily - 7 x weekly - 3 sets - 10 reps - Small Range Straight Leg Raise  - 1 x daily - 7 x weekly - 3 sets - 10 reps - Supine Heel Slide  - 1 x daily - 7 x weekly - 3 sets - 10 reps  GOALS: Goals reviewed with patient? Yes  SHORT TERM GOALS: Target date: 05/10/2024  Patient will be independent in home exercise program to improve strength/mobility for better functional independence with ADLs. Baseline: to be provided on visit 2: 12/28/2023- No caregiver with patient to determine if compliant with HEP Goal  5/13: daugher states that they are completing some exercises almost daily; 04/09/2024= No caregiver present today to f/u with compliance of HEP- Patient verbalized yes and stated standing, moving my legs.  status: IN PROGRESS  LONG TERM GOALS: Target date: 07/02/2024  2.  Patient (> 28 years old) will complete five times sit to stand test by 15 seconds and only min assist indicating an increased LE strength and improved balance. Baseline: 48 with mod assist from PT; 12/28/2023= 44 sec from EOM at lowest postion (approx 21 in) with Left UE support and CGA to min A  from PT 5/13: 28.80 sec with HW from [facility]; min assist from PT for full erect standing; 04/09/2024= 26.56 sec with HW in front- pushing up using LUE on armrest of w/c- Min Assist for full erect standing 8/26: 25.41 sec with UE pushing from arm rest and reaching for rail on wall; min a to mod assist from PT throughout all repetitions for adequate anterior weight shift  Goal status: Not Met   3.  Patient will increase FIST score by > 6 points to demonstrate decreased fall risk during functional activities Baseline: 37 5/13: 46/56 Goal status: MET  4.  Patient will complete 10 meter walk test to with HW in less than 1 minute with min assist as to improve gait speed for better community ambulation and to reduce fall risk. Baseline: 6 ft with HW and mod assist; 12/28/2023=18 feet with HW- mod assist  5/13: 2:11 min with HW, min-mod assist. 04/09/2024= Patient ambulated approx 22 feet using HW with Min assist and mod VC for gait sequencing-then experienced LOB requiring Mod Assist to maintain standing. 8/26: 2:39min (0 .92m/s) with HW and min fading to mod assist to advance the RLE with fatigue (165 sec); 06/06/2024= 0.85 m/s sec avg with HM with CGA and close w/c follow. Goal status: PROGRESSING  5.  Patient will perform stand pivot transfer to and from [facility] with HW and CGA for safety to allow safe transfer to toilet with daughter without use of rail on the wall. Baseline: mod assist and heavy use of rail. 12/28/2023= Min to Mod assist with repetitive VC for technique 5/13: min assist overall with HW; mod assist initially both directions  to prevent posterior LOB. 04/09/2024- Min assist physically in both directions yet increased VC for safety- if provided more time- patient able to make correct movements 75% of time during transfers- still inconsistent with reaching back and pivoting on Right LE  8/26: mod assist from PT to the R and L on this day due to posterior LOB  Goal status: Progressing  6.  Patient will  improve bed mobility to min assist only to reduce care giver burden and improve safety of transfers  Baseline: mod-max assist 5/13: Min-mod assist for sit<>supine. Min assist only for RLE management for rolling; 04/09/2024- unable to test due to time  8/26. Mod assist for sit<>supine movement at trunk and RLE and min assist for rolling tasks on this day.  Goal status: ONGOING  ASSESSMENT:  CLINICAL IMPRESSION:  Patient returned to form with much improved effort today. She was able to advance her RLE better today as well and increased overall gait endurance in presence of her daughter. Less overall Cues with gait sequencing and patient exhibited no LOB walking either forward or backward today. Pt will benefit from continued skilled PT to address strength and balance deficits to improve safety for transfers, reduce care giver burden, and allow return to PLOF and possible return home from Assisted living.   OBJECTIVE IMPAIRMENTS: Abnormal gait, cardiopulmonary status limiting activity, decreased activity tolerance, decreased balance, decreased cognition, decreased coordination, decreased endurance, decreased knowledge of use of DME, decreased mobility, difficulty walking, decreased ROM, decreased strength, hypomobility, increased muscle spasms, impaired flexibility, impaired sensation, impaired tone, impaired UE functional use, improper body mechanics, and postural dysfunction.   ACTIVITY LIMITATIONS: carrying, lifting, bending, standing, squatting, stairs, transfers, bed mobility, continence, bathing, toileting, dressing, reach over head, hygiene/grooming, and locomotion level  PARTICIPATION LIMITATIONS: cleaning, interpersonal relationship, shopping, and community activity  PERSONAL FACTORS: 3+ comorbidities: HTN, chronic CVA, Afib, Lupus  are also affecting patient's functional outcome.   REHAB POTENTIAL: Fair chronic CVA with poor progress in prior interventions with this clinic  CLINICAL  DECISION MAKING: Unstable/unpredictable  EVALUATION COMPLEXITY: High  PLAN:  PT FREQUENCY: 1-2x/week  PT DURATION: 12 weeks  PLANNED INTERVENTIONS: 97110-Therapeutic exercises, 97530- Therapeutic activity, [id number]- Neuromuscular re-education, 97535- Self Care, 02859- Manual therapy, [id number]- Gait training, [phone]- Orthotic  Fit/training, 02886- Aquatic Therapy, [phone]- Splinting, 02985- Electrical stimulation (unattended), [phone]- Electrical stimulation (manual), Patient/Family education, Balance training, Stair training, Joint mobilization, Vestibular training, Visual/preceptual remediation/compensation, Cognitive remediation, DME instructions, Wheelchair mobility training, Cryotherapy, and Moist heat  PLAN FOR NEXT SESSION:  Continue with functional standing Continue with transfer safety Continue with gait training.   Chyrl London, PT Physical Therapist - [place]  [facility]  8:27 AM

## 2024-07-02 ENCOUNTER — Ambulatory Visit

## 2024-07-02 ENCOUNTER — Ambulatory Visit: Admitting: Occupational Therapy

## 2024-07-02 ENCOUNTER — Encounter: Admitting: Speech Pathology

## 2024-07-02 DIAGNOSIS — M6281 Muscle weakness (generalized): Secondary | ICD-10-CM

## 2024-07-02 DIAGNOSIS — R482 Apraxia: Secondary | ICD-10-CM

## 2024-07-02 DIAGNOSIS — R278 Other lack of coordination: Secondary | ICD-10-CM

## 2024-07-02 DIAGNOSIS — R269 Unspecified abnormalities of gait and mobility: Secondary | ICD-10-CM

## 2024-07-02 DIAGNOSIS — R41841 Cognitive communication deficit: Secondary | ICD-10-CM

## 2024-07-02 DIAGNOSIS — R2689 Other abnormalities of gait and mobility: Secondary | ICD-10-CM

## 2024-07-02 DIAGNOSIS — R2681 Unsteadiness on feet: Secondary | ICD-10-CM

## 2024-07-02 DIAGNOSIS — R262 Difficulty in walking, not elsewhere classified: Secondary | ICD-10-CM

## 2024-07-02 NOTE — Therapy (Signed)
 OUTPATIENT PHYSICAL THERAPY TREATMENT/RECERT    Patient Name: Lindsey [name] MRN: [id number] DOB:06/25/1956, 68 y.o., female Today's Date: 07/02/2024  PCP: Lorel Maxie LABOR, MD  REFERRING PROVIDER: Lorilee Sven SQUIBB, MD   END OF SESSION:   PT End of Session - 07/02/24 0854     Visit Number 48    Number of Visits 72    Date for Recertification  09/24/24    Authorization Type Medicare/Medicaid    Progress Note Due on Visit 50    PT Start Time 0848    PT Stop Time 0929    PT Time Calculation (min) 41 min    Equipment Utilized During Treatment Gait belt    Activity Tolerance Patient tolerated treatment well;No increased pain;Patient limited by fatigue    Behavior During Therapy [facility] for tasks assessed/performed                   Past Medical History:  Diagnosis Date   Aphasia    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 No pertinent surgical history. 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   REFERRING DIAG:  Diagnosis  I69.398,R25.2 (ICD-10-CM) - Spasticity as late effect of cerebrovascular accident (CVA)    THERAPY DIAG:  Muscle weakness (generalized) - Plan: PT plan of care cert/re-cert  Other lack of coordination - Plan: PT plan of care cert/re-cert  Unsteadiness on feet - Plan: PT plan of care cert/re-cert  Difficulty in walking, not elsewhere classified - Plan: PT plan of care cert/re-cert  Abnormality of gait and mobility - Plan: PT plan of care cert/re-cert  Other abnormalities of gait and mobility - Plan: PT plan of care cert/re-cert  Apraxia - Plan: PT plan of care cert/re-cert  Cognitive communication deficit - Plan: PT plan of care cert/re-cert  Rationale for Evaluation and Treatment: Rehabilitation  SUBJECTIVE:  SUBJECTIVE STATEMENT: Patient reports walking some at facility.    PERTINENT HISTORY:  Daughter present for Evaluation to provide hx. Pt is familiar to this clinic and has been seen for multiple bouts PT since initial CVA in 2021. Pt d/c'ed from PT serviced in April of 2024 with plans to receive PT services through [facility]. Daughter reports that she was only receiving minimal therapy in facility. Reports at least 1 fall in the last 6 months and daughter asked staff to transfer pt with lift since fall.  Daughter would want pt to demonstrate improve safety with transfers to allow bil transfers with reduced use of rail to allow pt to return home for assisted living.    PAIN:  Are you having pain? No  PRECAUTIONS: Fall  RED FLAGS: None   WEIGHT BEARING RESTRICTIONS: No  FALLS: Has patient fallen in last 6 months? Yes. Number of falls 1  LIVING  ENVIRONMENT: Lives with: lives with their family and lives in a skilled nursing facility Lives in: Other SNF  Stairs: No Has following equipment at home: Hemi walker and Wheelchair (manual)  PLOF: Requires assistive device for independence, Needs assistance with ADLs, Needs assistance with homemaking, and WC level currently   PATIENT GOALS: move better - walking or transferring with hemi walking   OBJECTIVE:  Note: Objective measures were completed at Evaluation unless otherwise noted.  DIAGNOSTIC FINDINGS:  Head CT:  IMPRESSION: 1. No acute intracranial abnormality. 2. No acute displaced fracture or traumatic listhesis of the cervical spine.  Cervical CT:  MPRESSION: 1. No acute intracranial abnormality. 2. No acute displaced fracture or traumatic listhesis of the cervical spine.  COGNITION: Overall cognitive status: History of cognitive impairments - at baseline   SENSATION: Light touch: Impaired  Proprioception: Impaired  Able to detect depe pressure   COORDINATION: Increased tone on the RLE   EDEMA:  Mild RLE distal edema   MUSCLE TONE: RLE: Mild and Moderate  MUSCLE LENGTH: Hamstrings: grossly limited   POSTURE: rounded shoulders, forward head, and posterior bias   LOWER EXTREMITY ROM:     Active  Right Eval Left Eval  Hip flexion  Limited to 5 deg beyond 90 in sitting  Knee flexion  90deg in sitting  Knee extension  Lacking 10 deg full extension  Ankle dorsiflexion  none  Ankle plantarflexion  none   (Blank rows = not tested)  LOWER EXTREMITY MMT:    MMT Right Eval Left Eval  Hip flexion  2+  Hip extension    Hip abduction  2  Hip adduction  3  Hip internal rotation    Hip external rotation    Knee flexion  2+  Knee extension  3+  Ankle dorsiflexion  0  Ankle plantarflexion  0  Ankle inversion    Ankle eversion    (Blank rows = not tested)  BED MOBILITY:  Sit to supine Mod A Supine to sit Mod A Rolling to Right Min A Rolling to  Left Min A  TRANSFERS: Assistive device utilized: Hemi walker  Sit to stand: Mod A Stand to sit: Mod A Chair to chair: Mod A Floor: unable to perform   CURB:  Level of Assistance: Total A Assistive device utilized: rail Curb Comments: unable to perform   GAIT: Gait pattern: non-functional constant posterior bias , step to pattern, decreased stance time- Right, Right hip hike, lateral hip instability, and lateral lean- Left Distance walked: 40ft Assistive device utilized: Hemi walker Level of assistance: Mod A  Comments: moderate cues for sequencing of HW and gait pattern in turns   FUNCTIONAL TESTS:  5 times sit to stand: 48 sec with mod Assist from PT  Timed up and go (TUG): unable to perform  10 meter walk test: 6 ft with min-mod assist and HW.  FIST:                                                                                                                               TREATMENT DATE: 07/02/2024    Physical therapy treatment session today consisted of completing assessment of goals and administration of testing as demonstrated and documented in flow sheet, treatment, and goals section of this note. Addition treatments may be found below.   Pt performed 5 time sit<>stand (5xSTS): 2 trials today- 1st trial= 26.34 sec with min assist and VC for hand placement; 2nd attempt= 21.98 sec with less VC and only min assist sec (>15 sec indicates increased fall risk)    10 Meter Walk Test: Patient instructed to walk 10 meters (32.8 ft) as quickly and as safely as possible at their normal speed x2 and at a fast speed x2. Time measured from 2 meter mark to 8 meter mark to accommodate ramp-up and ramp-down.  Normal speed 1: 0.13 m/s Normal speed 2: 0.14  m/s Cut off scores: <0.4 m/s = household Ambulator, 0.4-0.8 m/s = limited community Ambulator, >0.8 m/s = community Ambulator, >1.2 m/s = crossing a street, <1.0 = increased fall risk MCID 0.05 m/s (small), 0.13 m/s (moderate), 0.06  m/s (significant)  (ANPTA Core Set of Outcome Measures for Adults with Neurologic Conditions, 2018)   GAIT:  Patient ambulated 15 feet (from 1 stationary chair to another focusing on standing up without hesitating and then walk to another chair- focusing on turning 180 feet then backing up) - patient performed 4 rounds today- Focusing on increasing step length, standing erect, and pivoting/turning feet. Patient became emotional at times today- frustrated with turning and backing up and on last round requested to stop today- requesting to end and go home. She denied any pain but requested to end session.   TA: - Sit to stand - Continuing with focus on consistency with hand placement and ability to reach back with stand to sit x 10 reps -VC for technique - mostly for reaching back. - Chair manuevering- swinging to left in transport chair back to right x 5.              PATIENT EDUCATION: Education details: .  Pt educated throughout session about proper posture and technique with exercises. Improved exercise technique, movement at target joints, use of target muscles after min to mod verbal, visual, tactile cues Interpretation of outcome measure; need for continued progress to justify continued PT services.   Person educated: Patient Education method: Explanation Education comprehension: verbalized understanding  HOME EXERCISE PROGRAM: Access Code: [email or web address] URL: [email or web address] Date: 12/05/2023 Prepared by: Massie Dollar  Exercises -  Sit to Stand with Counter Support  - 1 x daily - 3-4 x weekly - 2 sets - 10 reps - Seated Long Arc Quad  - 1 x daily - 3-4 x weekly - 2 sets - 10 reps - 2 hold - Seated March  - 1 x daily - 3-4 x weekly - 2 sets - 10 reps - 2 hold - Standing Hip Abduction with Counter Support  - 1 x daily - 7 x weekly - 3 sets - 5 reps - Wide Stance with Counter Support  - 1 x daily - 7 x weekly - 3 sets - 2 reps - 30 hold - Seated Hip Abduction   - 1 x daily - 7 x weekly - 3 sets - 10 reps - Supine Bridge  - 1 x daily - 7 x weekly - 3 sets - 10 reps - Supine Short Arc Quad  - 1 x daily - 7 x weekly - 3 sets - 10 reps - Bent Knee Fallouts  - 1 x daily - 7 x weekly - 3 sets - 10 reps - Small Range Straight Leg Raise  - 1 x daily - 7 x weekly - 3 sets - 10 reps - Supine Heel Slide  - 1 x daily - 7 x weekly - 3 sets - 10 reps  GOALS: Goals reviewed with patient? Yes  SHORT TERM GOALS: Target date: 08/03/2024  Patient will be independent in home exercise program to improve strength/mobility for better functional independence with ADLs. Baseline: to be provided on visit 2: 12/28/2023- No caregiver with patient to determine if compliant with HEP Goal  5/13: daugher states that they are completing some exercises almost daily; 04/09/2024= No caregiver present today to f/u with compliance of HEP- Patient verbalized yes and stated standing, moving my legs. 07/02/2024= upon last conversation with patient dtr at last visit- she is going to talk to the lady who sometimes bring patient to clinic to see if she can do some standing and the HEP at the facility with patient.  status: IN PROGRESS  LONG TERM GOALS: Target date: 09/24/2024  2.  Patient (> 42 years old) will complete five times sit to stand test by 15 seconds and only min assist indicating an increased LE strength and improved balance. Baseline: 48 with mod assist from PT; 12/28/2023= 44 sec from EOM at lowest postion (approx 21 in) with Left UE support and CGA to min A from PT 5/13: 28.80 sec with HW from [facility]; min assist from PT for full erect standing; 04/09/2024= 26.56 sec with HW in front- pushing up using LUE on armrest of w/c- Min Assist for full erect standing 8/26: 25.41 sec with UE pushing from arm rest and reaching for rail on wall; min a to mod assist from PT throughout all repetitions for adequate anterior weight shift; 07/02/2024= 21.98 sec with min physical assist Goal status:  PROGRESSING  3.  Patient will increase FIST score by > 6 points to demonstrate decreased fall risk during functional activities Baseline: 37 5/13: 46/56 Goal status: MET  4.  Patient will complete 10 meter walk test to with HW in less than 1 minute with min assist as to improve gait speed for better community ambulation and to reduce fall risk. Baseline: 6 ft with HW and mod assist; 12/28/2023=18 feet with HW- mod assist  5/13: 2:11 min with HW, min-mod assist. 04/09/2024= Patient ambulated approx 22 feet using HW with Min assist and mod VC for gait sequencing-then  experienced LOB requiring Mod Assist to maintain standing. 8/26: 2:18min (0 .37m/s) with HW and min fading to mod assist to advance the RLE with fatigue (165 sec); 06/06/2024= 0.85 m/s sec avg with HM with CGA and close w/c follow. 07/02/2024= 78 sec then 73 sec with HW= 0.14 m/s avg.  Goal status: PROGRESSING  5.  Patient will perform stand pivot transfer to and from [facility] with HW and CGA for safety to allow safe transfer to toilet with daughter without use of rail on the wall. Baseline: mod assist and heavy use of rail. 12/28/2023= Min to Mod assist with repetitive VC for technique 5/13: min assist overall with HW; mod assist initially both directions  to prevent posterior LOB. 04/09/2024- Min assist physically in both directions yet increased VC for safety- if provided more time- patient able to make correct movements 75% of time during transfers- still inconsistent with reaching back and pivoting on Right LE  8/26: mod assist from PT to the R and L on this day due to posterior LOB  07/02/2204- Min assist from w/c to standard chair with VC for hand placement- no posterior lean on both attempts today- min assist to stand and VC for sequencing.  Goal status: Progressing  6.  Patient will improve bed mobility to min assist only to reduce care giver burden and improve safety of transfers  Baseline: mod-max assist 5/13: Min-mod assist for  sit<>supine. Min assist only for RLE management for rolling; 04/09/2024- unable to test due to time  8/26. Mod assist for sit<>supine movement at trunk and RLE and min assist for rolling tasks on this day.  Goal status: ONGOING  ASSESSMENT:  CLINICAL IMPRESSION:  Patient presents with good motivation for recert visit. She put forth really improved effort and able to demonstrate some improved values today. She was significantly faster today with less overall cues with gait. She was also to stand and pivot transfer consistently x 2 attempts- increased time but overall less VC. She also demonstrated improved 5 x STS today without increased assistance. She needs to continue this progress with more consistency and hoping to have caregiver add more help at home. Patient's condition has the potential to improve in response to therapy. Maximum improvement is yet to be obtained. The anticipated improvement is attainable and reasonable in a generally predictable time.  Pt will benefit from continued skilled PT to address strength and balance deficits to improve safety for transfers, reduce care giver burden, and allow return to PLOF and possible return home from Assisted living.   OBJECTIVE IMPAIRMENTS: Abnormal gait, cardiopulmonary status limiting activity, decreased activity tolerance, decreased balance, decreased cognition, decreased coordination, decreased endurance, decreased knowledge of use of DME, decreased mobility, difficulty walking, decreased ROM, decreased strength, hypomobility, increased muscle spasms, impaired flexibility, impaired sensation, impaired tone, impaired UE functional use, improper body mechanics, and postural dysfunction.   ACTIVITY LIMITATIONS: carrying, lifting, bending, standing, squatting, stairs, transfers, bed mobility, continence, bathing, toileting, dressing, reach over head, hygiene/grooming, and locomotion level  PARTICIPATION LIMITATIONS: cleaning, interpersonal  relationship, shopping, and community activity  PERSONAL FACTORS: 3+ comorbidities: HTN, chronic CVA, Afib, Lupus  are also affecting patient's functional outcome.   REHAB POTENTIAL: Fair chronic CVA with poor progress in prior interventions with this clinic  CLINICAL DECISION MAKING: Unstable/unpredictable  EVALUATION COMPLEXITY: High  PLAN:  PT FREQUENCY: 1-2x/week  PT DURATION: 12 weeks  PLANNED INTERVENTIONS: 97110-Therapeutic exercises, 97530- Therapeutic activity, [id number]- Neuromuscular re-education, 97535- Self Care, [id number]- Manual therapy, [id number]- Gait training, [phone]- Orthotic  Fit/training, 02886- Aquatic Therapy, [phone]- Splinting, 02985- Electrical stimulation (unattended), [phone]- Electrical stimulation (manual), Patient/Family education, Balance training, Stair training, Joint mobilization, Vestibular training, Visual/preceptual remediation/compensation, Cognitive remediation, DME instructions, Wheelchair mobility training, Cryotherapy, and Moist heat  PLAN FOR NEXT SESSION:  Continue with functional standing Continue with transfer safety Continue with gait training.   Chyrl London, PT Physical Therapist - [facility]  11:28 AM 07/02/24

## 2024-07-04 ENCOUNTER — Ambulatory Visit: Admitting: Occupational Therapy

## 2024-07-04 ENCOUNTER — Encounter: Admitting: Speech Pathology

## 2024-07-04 ENCOUNTER — Ambulatory Visit: Attending: Physical Medicine and Rehabilitation

## 2024-07-04 DIAGNOSIS — R269 Unspecified abnormalities of gait and mobility: Secondary | ICD-10-CM | POA: Diagnosis present

## 2024-07-04 DIAGNOSIS — R41841 Cognitive communication deficit: Secondary | ICD-10-CM | POA: Insufficient documentation

## 2024-07-04 DIAGNOSIS — R262 Difficulty in walking, not elsewhere classified: Secondary | ICD-10-CM | POA: Insufficient documentation

## 2024-07-04 DIAGNOSIS — M6281 Muscle weakness (generalized): Secondary | ICD-10-CM | POA: Diagnosis present

## 2024-07-04 DIAGNOSIS — R2681 Unsteadiness on feet: Secondary | ICD-10-CM | POA: Diagnosis present

## 2024-07-04 DIAGNOSIS — R2689 Other abnormalities of gait and mobility: Secondary | ICD-10-CM | POA: Diagnosis present

## 2024-07-04 DIAGNOSIS — R278 Other lack of coordination: Secondary | ICD-10-CM | POA: Insufficient documentation

## 2024-07-04 DIAGNOSIS — R482 Apraxia: Secondary | ICD-10-CM | POA: Insufficient documentation

## 2024-07-04 NOTE — Therapy (Signed)
 OUTPATIENT PHYSICAL THERAPY TREATMENT    Patient Name: Lindsey [name] MRN: [id number] DOB:Feb 10, 1956, 68 y.o., female Today's Date: 07/04/2024  PCP: Lorel Maxie LABOR, MD  REFERRING PROVIDER: Lorilee Sven SQUIBB, MD   END OF SESSION:   PT End of Session - 07/04/24 0907     Visit Number 49    Number of Visits 72    Date for Recertification  09/24/24    Authorization Type Medicare/Medicaid    Progress Note Due on Visit 50    PT Start Time 0851    PT Stop Time 0930    PT Time Calculation (min) 39 min    Equipment Utilized During Treatment Gait belt    Activity Tolerance Patient tolerated treatment well;No increased pain;Patient limited by fatigue    Behavior During Therapy [facility] for tasks assessed/performed                    Past Medical History:  Diagnosis Date   Aphasia    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 No pertinent surgical history. 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   REFERRING DIAG:  Diagnosis  I69.398,R25.2 (ICD-10-CM) - Spasticity as late effect of cerebrovascular accident (CVA)    THERAPY DIAG:  Muscle weakness (generalized)  Other lack of coordination  Unsteadiness on feet  Difficulty in walking, not elsewhere classified  Abnormality of gait and mobility  Other abnormalities of gait and mobility  Apraxia  Cognitive communication deficit  Rationale for Evaluation and Treatment: Rehabilitation  SUBJECTIVE:  SUBJECTIVE STATEMENT: Patient states good morning  Patient reports ready and Got to get better!    PERTINENT HISTORY:  Daughter present for Evaluation to provide hx. Pt is familiar to this clinic and has been seen for multiple bouts PT since initial CVA in 2021. Pt d/c'ed from PT serviced in April of 2024 with plans to receive PT services through [facility]. Daughter reports that she was only receiving minimal therapy in facility. Reports at least 1 fall in the last 6 months and daughter asked staff to transfer pt with lift since fall.  Daughter would want pt to demonstrate improve safety with transfers to allow bil transfers with reduced use of rail to allow pt to return home for assisted living.    PAIN:  Are you having pain? No  PRECAUTIONS: Fall  RED FLAGS: None   WEIGHT BEARING RESTRICTIONS: No  FALLS: Has patient fallen in last 6 months? Yes. Number of falls 1  LIVING ENVIRONMENT: Lives with: lives with their family and lives in a skilled nursing facility Lives in: Other SNF  Stairs: No Has following equipment at home: Hemi walker and Wheelchair (manual)  PLOF: Requires assistive device for independence, Needs  assistance with ADLs, Needs assistance with homemaking, and WC level currently   PATIENT GOALS: move better - walking or transferring with hemi walking   OBJECTIVE:  Note: Objective measures were completed at Evaluation unless otherwise noted.  DIAGNOSTIC FINDINGS:  Head CT:  IMPRESSION: 1. No acute intracranial abnormality. 2. No acute displaced fracture or traumatic listhesis of the cervical spine.  Cervical CT:  MPRESSION: 1. No acute intracranial abnormality. 2. No acute displaced fracture or traumatic listhesis of the cervical spine.  COGNITION: Overall cognitive status: History of cognitive impairments - at baseline   SENSATION: Light touch: Impaired  Proprioception: Impaired  Able to detect depe pressure   COORDINATION: Increased tone on the RLE   EDEMA:  Mild RLE distal edema   MUSCLE TONE: RLE: Mild and Moderate  MUSCLE LENGTH: Hamstrings: grossly limited   POSTURE: rounded shoulders, forward head, and posterior bias   LOWER EXTREMITY ROM:     Active  Right Eval Left Eval  Hip flexion  Limited to 5 deg beyond 90 in sitting  Knee flexion  90deg in sitting  Knee extension  Lacking 10 deg full extension  Ankle dorsiflexion  none  Ankle plantarflexion  none   (Blank rows = not tested)  LOWER EXTREMITY MMT:    MMT Right Eval Left Eval  Hip flexion  2+  Hip extension    Hip abduction  2  Hip adduction  3  Hip internal rotation    Hip external rotation    Knee flexion  2+  Knee extension  3+  Ankle dorsiflexion  0  Ankle plantarflexion  0  Ankle inversion    Ankle eversion    (Blank rows = not tested)  BED MOBILITY:  Sit to supine Mod A Supine to sit Mod A Rolling to Right Min A Rolling to Left Min A  TRANSFERS: Assistive device utilized: Hemi walker  Sit to stand: Mod A Stand to sit: Mod A Chair to chair: Mod A Floor: unable to perform   CURB:  Level of Assistance: Total A Assistive device utilized: rail Curb Comments: unable  to perform   GAIT: Gait pattern: non-functional constant posterior bias , step to pattern, decreased stance time- Right, Right hip hike, lateral hip instability, and lateral lean- Left Distance walked: 58ft Assistive device utilized:  Hemi walker Level of assistance: Mod A Comments: moderate cues for sequencing of HW and gait pattern in turns   FUNCTIONAL TESTS:  5 times sit to stand: 48 sec with mod Assist from PT  Timed up and go (TUG): unable to perform  10 meter walk test: 6 ft with min-mod assist and HW.  FIST:                                                                                                                               TREATMENT DATE: 07/04/2024  TE:  Active Knee ext (seated) 2 x 10 reps each LE PROM to improve R knee Flex x several min  NMR:  Static stand without UE support x 2 min x 2 trials (VC for erect posture)  30 bouts of lateral weight shifting L-R with 1 UE support x 2 trials 30 bouts of lateral weight shifting L-R without UE support x 2 trials Static stand with 1 UE support x 2 min  x 2 trials (focusing on longevity of standing)  TA: - Sit to stand - Continuing with focus on consistency with hand placement and ability to reach back with stand to sit 2  x 10 reps -VC for technique - mostly for reaching back at support bar - min assist to position RLE in flexed position. - Step taps onto wood block 1.5 x 20 reps (increased time on RLE) -Step bwd alt LE at support bar with BUE Support x 20 reps (increased time on RLE)           PATIENT EDUCATION: Education details: .  Pt educated throughout session about proper posture and technique with exercises. Improved exercise technique, movement at target joints, use of target muscles after min to mod verbal, visual, tactile cues Interpretation of outcome measure; need for continued progress to justify continued PT services.   Person educated: Patient Education method: Explanation Education  comprehension: verbalized understanding  HOME EXERCISE PROGRAM: Access Code: [email or web address] URL: [email or web address] Date: 12/05/2023 Prepared by: Massie Dollar  Exercises - Sit to Stand with Counter Support  - 1 x daily - 3-4 x weekly - 2 sets - 10 reps - Seated Long Arc Quad  - 1 x daily - 3-4 x weekly - 2 sets - 10 reps - 2 hold - Seated March  - 1 x daily - 3-4 x weekly - 2 sets - 10 reps - 2 hold - Standing Hip Abduction with Counter Support  - 1 x daily - 7 x weekly - 3 sets - 5 reps - Wide Stance with Counter Support  - 1 x daily - 7 x weekly - 3 sets - 2 reps - 30 hold - Seated Hip Abduction  - 1 x daily - 7 x weekly - 3 sets - 10 reps - Supine Bridge  - 1 x daily - 7 x weekly - 3 sets - 10 reps - Supine Short Arc Quad  - 1  x daily - 7 x weekly - 3 sets - 10 reps - Bent Knee Fallouts  - 1 x daily - 7 x weekly - 3 sets - 10 reps - Small Range Straight Leg Raise  - 1 x daily - 7 x weekly - 3 sets - 10 reps - Supine Heel Slide  - 1 x daily - 7 x weekly - 3 sets - 10 reps  GOALS: Goals reviewed with patient? Yes  SHORT TERM GOALS: Target date: 08/03/2024  Patient will be independent in home exercise program to improve strength/mobility for better functional independence with ADLs. Baseline: to be provided on visit 2: 12/28/2023- No caregiver with patient to determine if compliant with HEP Goal  5/13: daugher states that they are completing some exercises almost daily; 04/09/2024= No caregiver present today to f/u with compliance of HEP- Patient verbalized yes and stated standing, moving my legs. 07/02/2024= upon last conversation with patient dtr at last visit- she is going to talk to the lady who sometimes bring patient to clinic to see if she can do some standing and the HEP at the facility with patient.  status: IN PROGRESS  LONG TERM GOALS: Target date: 09/24/2024  2.  Patient (> 63 years old) will complete five times sit to stand test by 15 seconds and only min assist  indicating an increased LE strength and improved balance. Baseline: 48 with mod assist from PT; 12/28/2023= 44 sec from EOM at lowest postion (approx 21 in) with Left UE support and CGA to min A from PT 5/13: 28.80 sec with HW from [facility]; min assist from PT for full erect standing; 04/09/2024= 26.56 sec with HW in front- pushing up using LUE on armrest of w/c- Min Assist for full erect standing 8/26: 25.41 sec with UE pushing from arm rest and reaching for rail on wall; min a to mod assist from PT throughout all repetitions for adequate anterior weight shift; 07/02/2024= 21.98 sec with min physical assist Goal status: PROGRESSING  3.  Patient will increase FIST score by > 6 points to demonstrate decreased fall risk during functional activities Baseline: 37 5/13: 46/56 Goal status: MET  4.  Patient will complete 10 meter walk test to with HW in less than 1 minute with min assist as to improve gait speed for better community ambulation and to reduce fall risk. Baseline: 6 ft with HW and mod assist; 12/28/2023=18 feet with HW- mod assist  5/13: 2:11 min with HW, min-mod assist. 04/09/2024= Patient ambulated approx 22 feet using HW with Min assist and mod VC for gait sequencing-then experienced LOB requiring Mod Assist to maintain standing. 8/26: 2:107min (0 .28m/s) with HW and min fading to mod assist to advance the RLE with fatigue (165 sec); 06/06/2024= 0.85 m/s sec avg with HM with CGA and close w/c follow. 07/02/2024= 78 sec then 73 sec with HW= 0.14 m/s avg.  Goal status: PROGRESSING  5.  Patient will perform stand pivot transfer to and from [facility] with HW and CGA for safety to allow safe transfer to toilet with daughter without use of rail on the wall. Baseline: mod assist and heavy use of rail. 12/28/2023= Min to Mod assist with repetitive VC for technique 5/13: min assist overall with HW; mod assist initially both directions  to prevent posterior LOB. 04/09/2024- Min assist physically in both directions yet  increased VC for safety- if provided more time- patient able to make correct movements 75% of time during transfers- still inconsistent with reaching back  and pivoting on Right LE  8/26: mod assist from PT to the R and L on this day due to posterior LOB  07/02/2204- Min assist from w/c to standard chair with VC for hand placement- no posterior lean on both attempts today- min assist to stand and VC for sequencing.  Goal status: Progressing  6.  Patient will improve bed mobility to min assist only to reduce care giver burden and improve safety of transfers  Baseline: mod-max assist 5/13: Min-mod assist for sit<>supine. Min assist only for RLE management for rolling; 04/09/2024- unable to test due to time  8/26. Mod assist for sit<>supine movement at trunk and RLE and min assist for rolling tasks on this day.  Goal status: ONGOING  ASSESSMENT:  CLINICAL IMPRESSION:  Patient continues to progress with less overall VC for safe sit to stand- and less overall physical assist. Still needs some help to position RLE appropriately due to difficulty flexing R knee. She was able to stand > 8 min at a time today without rest break- continuous VC for posture but improved standing endurance overall.  Pt will benefit from continued skilled PT to address strength and balance deficits to improve safety for transfers, reduce care giver burden, and allow return to PLOF and possible return home from Assisted living.   OBJECTIVE IMPAIRMENTS: Abnormal gait, cardiopulmonary status limiting activity, decreased activity tolerance, decreased balance, decreased cognition, decreased coordination, decreased endurance, decreased knowledge of use of DME, decreased mobility, difficulty walking, decreased ROM, decreased strength, hypomobility, increased muscle spasms, impaired flexibility, impaired sensation, impaired tone, impaired UE functional use, improper body mechanics, and postural dysfunction.   ACTIVITY LIMITATIONS: carrying,  lifting, bending, standing, squatting, stairs, transfers, bed mobility, continence, bathing, toileting, dressing, reach over head, hygiene/grooming, and locomotion level  PARTICIPATION LIMITATIONS: cleaning, interpersonal relationship, shopping, and community activity  PERSONAL FACTORS: 3+ comorbidities: HTN, chronic CVA, Afib, Lupus  are also affecting patient's functional outcome.   REHAB POTENTIAL: Fair chronic CVA with poor progress in prior interventions with this clinic  CLINICAL DECISION MAKING: Unstable/unpredictable  EVALUATION COMPLEXITY: High  PLAN:  PT FREQUENCY: 1-2x/week  PT DURATION: 12 weeks  PLANNED INTERVENTIONS: 97110-Therapeutic exercises, 97530- Therapeutic activity, [id number]- Neuromuscular re-education, 97535- Self Care, [id number]- Manual therapy, [phone]- Gait training, [phone]- Orthotic Fit/training, [phone]- Aquatic Therapy, [phone]- Splinting, [id number]- Electrical stimulation (unattended), [phone]- Electrical stimulation (manual), Patient/Family education, Balance training, Stair training, Joint mobilization, Vestibular training, Visual/preceptual remediation/compensation, Cognitive remediation, DME instructions, Wheelchair mobility training, Cryotherapy, and Moist heat  PLAN FOR NEXT SESSION:  Continue with functional standing Continue with transfer safety Continue with gait training.   Chyrl London, PT Physical Therapist - [facility]  10:03 AM 07/04/24

## 2024-07-09 ENCOUNTER — Ambulatory Visit: Admitting: Occupational Therapy

## 2024-07-09 ENCOUNTER — Ambulatory Visit

## 2024-07-09 ENCOUNTER — Encounter: Admitting: Speech Pathology

## 2024-07-09 DIAGNOSIS — M6281 Muscle weakness (generalized): Secondary | ICD-10-CM | POA: Diagnosis not present

## 2024-07-09 DIAGNOSIS — R269 Unspecified abnormalities of gait and mobility: Secondary | ICD-10-CM

## 2024-07-09 DIAGNOSIS — R2689 Other abnormalities of gait and mobility: Secondary | ICD-10-CM

## 2024-07-09 DIAGNOSIS — R2681 Unsteadiness on feet: Secondary | ICD-10-CM

## 2024-07-09 DIAGNOSIS — R41841 Cognitive communication deficit: Secondary | ICD-10-CM

## 2024-07-09 DIAGNOSIS — R278 Other lack of coordination: Secondary | ICD-10-CM

## 2024-07-09 DIAGNOSIS — R262 Difficulty in walking, not elsewhere classified: Secondary | ICD-10-CM

## 2024-07-09 DIAGNOSIS — R482 Apraxia: Secondary | ICD-10-CM

## 2024-07-09 NOTE — Therapy (Signed)
 OUTPATIENT PHYSICAL THERAPY TREATMENT/Physical Therapy Progress Note   Dates of reporting period  05/28/2024   to   07/09/2024     Patient Name: Lindsey [name] MRN: [id number] DOB:[birth year], 68 y.o., female Today's Date: 07/09/2024  PCP: Lorel Maxie LABOR, MD  REFERRING PROVIDER: Lorilee Sven SQUIBB, MD   END OF SESSION:   PT End of Session - 07/09/24 0853     Visit Number 50    Number of Visits 72    Date for Recertification  09/24/24    Authorization Type Medicare/Medicaid    Progress Note Due on Visit 60    PT Start Time 0847    PT Stop Time 0929    PT Time Calculation (min) 42 min    Equipment Utilized During Treatment Gait belt    Activity Tolerance Patient tolerated treatment well;No increased pain;Patient limited by fatigue    Behavior During Therapy [facility] for tasks assessed/performed                    Past Medical History:  Diagnosis Date   Aphasia    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 No pertinent surgical history. 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   REFERRING DIAG:  Diagnosis  I69.398,R25.2 (ICD-10-CM) - Spasticity as late effect of cerebrovascular accident (CVA)    THERAPY DIAG:  Muscle weakness (generalized)  Other lack of coordination  Unsteadiness on feet  Difficulty in walking, not elsewhere classified  Abnormality of gait and mobility  Other abnormalities of gait and mobility  Apraxia  Cognitive communication deficit  Rationale for Evaluation and Treatment: Rehabilitation  SUBJECTIVE:  SUBJECTIVE STATEMENT: Patient states good morning  Patient reports ready and Got to get better!    PERTINENT HISTORY:  Daughter present for Evaluation to provide hx. Pt is familiar to this clinic and has been seen for multiple bouts PT since initial CVA in 2021. Pt d/c'ed from PT serviced in April of 2024 with plans to receive PT services through [facility]. Daughter reports that she was only receiving minimal therapy in facility. Reports at least 1 fall in the last 6 months and daughter asked staff to transfer pt with lift since fall.  Daughter would want pt to demonstrate improve safety with transfers to allow bil transfers with reduced use of rail to allow pt to return home for assisted living.    PAIN:  Are you having pain? No  PRECAUTIONS: Fall  RED FLAGS: None   WEIGHT BEARING RESTRICTIONS: No  FALLS: Has patient fallen in last 6 months? Yes. Number of falls 1  LIVING ENVIRONMENT: Lives with: lives with their family and lives in a skilled nursing facility Lives in: Other SNF  Stairs: No Has following equipment at home: Hemi  walker and Wheelchair (manual)  PLOF: Requires assistive device for independence, Needs assistance with ADLs, Needs assistance with homemaking, and WC level currently   PATIENT GOALS: move better - walking or transferring with hemi walking   OBJECTIVE:  Note: Objective measures were completed at Evaluation unless otherwise noted.  DIAGNOSTIC FINDINGS:  Head CT:  IMPRESSION: 1. No acute intracranial abnormality. 2. No acute displaced fracture or traumatic listhesis of the cervical spine.  Cervical CT:  MPRESSION: 1. No acute intracranial abnormality. 2. No acute displaced fracture or traumatic listhesis of the cervical spine.  COGNITION: Overall cognitive status: History of cognitive impairments - at baseline   SENSATION: Light touch: Impaired  Proprioception: Impaired  Able to detect depe pressure   COORDINATION: Increased tone on the RLE   EDEMA:  Mild RLE distal edema   MUSCLE TONE: RLE: Mild and Moderate  MUSCLE LENGTH: Hamstrings: grossly limited   POSTURE: rounded shoulders, forward head, and posterior bias   LOWER EXTREMITY ROM:     Active  Right Eval Left Eval  Hip flexion  Limited to 5 deg beyond 90 in sitting  Knee flexion  90deg in sitting  Knee extension  Lacking 10 deg full extension  Ankle dorsiflexion  none  Ankle plantarflexion  none   (Blank rows = not tested)  LOWER EXTREMITY MMT:    MMT Right Eval Left Eval  Hip flexion  2+  Hip extension    Hip abduction  2  Hip adduction  3  Hip internal rotation    Hip external rotation    Knee flexion  2+  Knee extension  3+  Ankle dorsiflexion  0  Ankle plantarflexion  0  Ankle inversion    Ankle eversion    (Blank rows = not tested)  BED MOBILITY:  Sit to supine Mod A Supine to sit Mod A Rolling to Right Min A Rolling to Left Min A  TRANSFERS: Assistive device utilized: Hemi walker  Sit to stand: Mod A Stand to sit: Mod A Chair to chair: Mod A Floor: unable to perform    CURB:  Level of Assistance: Total A Assistive device utilized: rail Curb Comments: unable to perform   GAIT: Gait pattern: non-functional constant posterior bias , step to pattern, decreased stance time- Right, Right hip hike, lateral hip instability, and lateral lean- Left Distance walked: 52ft Assistive device utilized:  Hemi walker Level of assistance: Mod A Comments: moderate cues for sequencing of HW and gait pattern in turns   FUNCTIONAL TESTS:  5 times sit to stand: 48 sec with mod Assist from PT  Timed up and go (TUG): unable to perform  10 meter walk test: 6 ft with min-mod assist and HW.  FIST:                                                                                                                               TREATMENT DATE: 07/09/2024  TE:  Active Knee ext (seated) 2 x 10 reps each LE PROM to improve R knee Flex x several min Active hip march (placing PT hands for goal height) 2 x 10 reps Rocking motion for Right knee flex- (positioned w/c facing wall- positioned foot against wall- then had patient rock w/c forward/backward to work on her ROM) x 3 min  TA:  Manual w/c maneurving - turning, fwd/bwd w/c propulsion x several min - covering around 30 feet length x 2    Gait:   Patient ambulated 30 feet and then another 45 feet using HW, CGA, gait belt and close w/c follow- No cues or physical assist holding right arm today. Patient performed step to gait mostly with 2 posterior LOB- very minimal R knee flex- yet able to clear R foot 90 % of time           PATIENT EDUCATION: Education details: .  Pt educated throughout session about proper posture and technique with exercises. Improved exercise technique, movement at target joints, use of target muscles after min to mod verbal, visual, tactile cues Interpretation of outcome measure; need for continued progress to justify continued PT services.   Person educated: Patient Education method:  Explanation Education comprehension: verbalized understanding  HOME EXERCISE PROGRAM: Access Code: [email or web address] URL: [email or web address] Date: 12/05/2023 Prepared by: Massie Dollar  Exercises - Sit to Stand with Counter Support  - 1 x daily - 3-4 x weekly - 2 sets - 10 reps - Seated Long Arc Quad  - 1 x daily - 3-4 x weekly - 2 sets - 10 reps - 2 hold - Seated March  - 1 x daily - 3-4 x weekly - 2 sets - 10 reps - 2 hold - Standing Hip Abduction with Counter Support  - 1 x daily - 7 x weekly - 3 sets - 5 reps - Wide Stance with Counter Support  - 1 x daily - 7 x weekly - 3 sets - 2 reps - 30 hold - Seated Hip Abduction  - 1 x daily - 7 x weekly - 3 sets - 10 reps - Supine Bridge  - 1 x daily - 7 x weekly - 3 sets - 10 reps - Supine Short Arc Quad  - 1 x daily - 7 x weekly - 3 sets - 10 reps - Bent Knee Fallouts  - 1 x daily - 7  x weekly - 3 sets - 10 reps - Small Range Straight Leg Raise  - 1 x daily - 7 x weekly - 3 sets - 10 reps - Supine Heel Slide  - 1 x daily - 7 x weekly - 3 sets - 10 reps  GOALS: Goals reviewed with patient? Yes  SHORT TERM GOALS: Target date: 08/03/2024  Patient will be independent in home exercise program to improve strength/mobility for better functional independence with ADLs. Baseline: to be provided on visit 2: 12/28/2023- No caregiver with patient to determine if compliant with HEP Goal  5/13: daugher states that they are completing some exercises almost daily; 04/09/2024= No caregiver present today to f/u with compliance of HEP- Patient verbalized yes and stated standing, moving my legs. 07/02/2024= upon last conversation with patient dtr at last visit- she is going to talk to the lady who sometimes bring patient to clinic to see if she can do some standing and the HEP at the facility with patient.  status: IN PROGRESS  LONG TERM GOALS: Target date: 09/24/2024  2.  Patient (> 49 years old) will complete five times sit to stand test by 15  seconds and only min assist indicating an increased LE strength and improved balance. Baseline: 48 with mod assist from PT; 12/28/2023= 44 sec from EOM at lowest postion (approx 21 in) with Left UE support and CGA to min A from PT 5/13: 28.80 sec with HW from [facility]; min assist from PT for full erect standing; 04/09/2024= 26.56 sec with HW in front- pushing up using LUE on armrest of w/c- Min Assist for full erect standing 8/26: 25.41 sec with UE pushing from arm rest and reaching for rail on wall; min a to mod assist from PT throughout all repetitions for adequate anterior weight shift; 07/02/2024= 21.98 sec with min physical assist Goal status: PROGRESSING  3.  Patient will increase FIST score by > 6 points to demonstrate decreased fall risk during functional activities Baseline: 37 5/13: 46/56 Goal status: MET  4.  Patient will complete 10 meter walk test to with HW in less than 1 minute with min assist as to improve gait speed for better community ambulation and to reduce fall risk. Baseline: 6 ft with HW and mod assist; 12/28/2023=18 feet with HW- mod assist  5/13: 2:11 min with HW, min-mod assist. 04/09/2024= Patient ambulated approx 22 feet using HW with Min assist and mod VC for gait sequencing-then experienced LOB requiring Mod Assist to maintain standing. 8/26: 2:83min (0 .60m/s) with HW and min fading to mod assist to advance the RLE with fatigue (165 sec); 06/06/2024= 0.85 m/s sec avg with HM with CGA and close w/c follow. 07/02/2024= 78 sec then 73 sec with HW= 0.14 m/s avg.  Goal status: PROGRESSING  5.  Patient will perform stand pivot transfer to and from [facility] with HW and CGA for safety to allow safe transfer to toilet with daughter without use of rail on the wall. Baseline: mod assist and heavy use of rail. 12/28/2023= Min to Mod assist with repetitive VC for technique 5/13: min assist overall with HW; mod assist initially both directions  to prevent posterior LOB. 04/09/2024- Min assist physically  in both directions yet increased VC for safety- if provided more time- patient able to make correct movements 75% of time during transfers- still inconsistent with reaching back and pivoting on Right LE  8/26: mod assist from PT to the R and L on this day due to posterior LOB  07/02/2204- Min assist from w/c to standard chair with VC for hand placement- no posterior lean on both attempts today- min assist to stand and VC for sequencing.  Goal status: Progressing  6.  Patient will improve bed mobility to min assist only to reduce care giver burden and improve safety of transfers  Baseline: mod-max assist 5/13: Min-mod assist for sit<>supine. Min assist only for RLE management for rolling; 04/09/2024- unable to test due to time  8/26. Mod assist for sit<>supine movement at trunk and RLE and min assist for rolling tasks on this day.  Goal status: ONGOING  ASSESSMENT:  CLINICAL IMPRESSION:  Patient presents in good spirits and motivated today. Did not reassess goals as patient just was reassessed 2 visits ago. She continues to progress with gait quality- less VC for technique and no physical arm support today. She was able to stretch her RLE better today with technique of pushing her foot up to walk and rocking w/c fwd/retro. Patient's condition has the potential to improve in response to therapy. Maximum improvement is yet to be obtained. The anticipated improvement is attainable and reasonable in a generally predictable time. Pt will benefit from continued skilled PT to address strength and balance deficits to improve safety for transfers, reduce care giver burden, and allow return to PLOF and possible return home from Assisted living.   OBJECTIVE IMPAIRMENTS: Abnormal gait, cardiopulmonary status limiting activity, decreased activity tolerance, decreased balance, decreased cognition, decreased coordination, decreased endurance, decreased knowledge of use of DME, decreased mobility, difficulty walking,  decreased ROM, decreased strength, hypomobility, increased muscle spasms, impaired flexibility, impaired sensation, impaired tone, impaired UE functional use, improper body mechanics, and postural dysfunction.   ACTIVITY LIMITATIONS: carrying, lifting, bending, standing, squatting, stairs, transfers, bed mobility, continence, bathing, toileting, dressing, reach over head, hygiene/grooming, and locomotion level  PARTICIPATION LIMITATIONS: cleaning, interpersonal relationship, shopping, and community activity  PERSONAL FACTORS: 3+ comorbidities: HTN, chronic CVA, Afib, Lupus  are also affecting patient's functional outcome.   REHAB POTENTIAL: Fair chronic CVA with poor progress in prior interventions with this clinic  CLINICAL DECISION MAKING: Unstable/unpredictable  EVALUATION COMPLEXITY: High  PLAN:  PT FREQUENCY: 1-2x/week  PT DURATION: 12 weeks  PLANNED INTERVENTIONS: 97110-Therapeutic exercises, 97530- Therapeutic activity, [id number]- Neuromuscular re-education, 97535- Self Care, [id number]- Manual therapy, [phone]- Gait training, [phone]- Orthotic Fit/training, [phone]- Aquatic Therapy, [phone]- Splinting, [id number]- Electrical stimulation (unattended), [phone]- Electrical stimulation (manual), Patient/Family education, Balance training, Stair training, Joint mobilization, Vestibular training, Visual/preceptual remediation/compensation, Cognitive remediation, DME instructions, Wheelchair mobility training, Cryotherapy, and Moist heat  PLAN FOR NEXT SESSION:   Continue with functional standing Continue with transfer safety Continue with gait training.   Chyrl London, PT Physical Therapist - [facility]  10:20 AM 07/09/24

## 2024-07-11 ENCOUNTER — Encounter: Admitting: Speech Pathology

## 2024-07-11 ENCOUNTER — Ambulatory Visit

## 2024-07-11 ENCOUNTER — Ambulatory Visit: Admitting: Occupational Therapy

## 2024-07-11 DIAGNOSIS — R278 Other lack of coordination: Secondary | ICD-10-CM

## 2024-07-11 DIAGNOSIS — R269 Unspecified abnormalities of gait and mobility: Secondary | ICD-10-CM

## 2024-07-11 DIAGNOSIS — R2681 Unsteadiness on feet: Secondary | ICD-10-CM

## 2024-07-11 DIAGNOSIS — M6281 Muscle weakness (generalized): Secondary | ICD-10-CM | POA: Diagnosis not present

## 2024-07-11 DIAGNOSIS — R482 Apraxia: Secondary | ICD-10-CM

## 2024-07-11 DIAGNOSIS — R262 Difficulty in walking, not elsewhere classified: Secondary | ICD-10-CM

## 2024-07-11 DIAGNOSIS — R41841 Cognitive communication deficit: Secondary | ICD-10-CM

## 2024-07-11 DIAGNOSIS — R2689 Other abnormalities of gait and mobility: Secondary | ICD-10-CM

## 2024-07-11 NOTE — Therapy (Signed)
 OUTPATIENT PHYSICAL THERAPY TREATMENT   Patient Name: Lindsey [name] MRN: [id number] DOB:May 30, 1956, 68 y.o., female Today's Date: 07/11/2024  PCP: Lorel Maxie LABOR, MD  REFERRING PROVIDER: Lorilee Sven SQUIBB, MD   END OF SESSION:   PT End of Session - 07/11/24 0848     Visit Number 51    Number of Visits 72    Date for Recertification  09/24/24    Authorization Type Medicare/Medicaid    Progress Note Due on Visit 60    PT Start Time 0845    PT Stop Time 0915    PT Time Calculation (min) 30 min    Equipment Utilized During Treatment Gait belt    Activity Tolerance Patient tolerated treatment well;No increased pain;Patient limited by fatigue    Behavior During Therapy [facility] for tasks assessed/performed                     Past Medical History:  Diagnosis Date   Aphasia    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 No pertinent surgical history. 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   REFERRING DIAG:  Diagnosis  I69.398,R25.2 (ICD-10-CM) - Spasticity as late effect of cerebrovascular accident (CVA)    THERAPY DIAG:  Muscle weakness (generalized)  Other lack of coordination  Unsteadiness on feet  Difficulty in walking, not elsewhere classified  Abnormality of gait and mobility  Other abnormalities of gait and mobility  Apraxia  Cognitive communication deficit  Rationale for Evaluation and Treatment: Rehabilitation  SUBJECTIVE:  SUBJECTIVE STATEMENT: I want to walk and get better.    PERTINENT HISTORY:  Daughter present for Evaluation to provide hx. Pt is familiar to this clinic and has been seen for multiple bouts PT since initial CVA in 2021. Pt d/c'ed from PT serviced in April of 2024 with plans to receive PT services through [facility]. Daughter reports that she was only receiving minimal therapy in facility. Reports at least 1 fall in the last 6 months and daughter asked staff to transfer pt with lift since fall.  Daughter would want pt to demonstrate improve safety with transfers to allow bil transfers with reduced use of rail to allow pt to return home for assisted living.    PAIN:  Are you having pain? No  PRECAUTIONS: Fall  RED FLAGS: None   WEIGHT BEARING RESTRICTIONS: No  FALLS: Has patient fallen in last 6 months? Yes. Number of falls 1  LIVING ENVIRONMENT: Lives with: lives with their family and lives in a skilled nursing facility Lives in: Other SNF  Stairs: No Has following equipment at home: Hemi walker and Wheelchair (manual)  PLOF: Requires assistive device for independence, Needs assistance with ADLs, Needs assistance with homemaking,  and WC level currently   PATIENT GOALS: move better - walking or transferring with hemi walking   OBJECTIVE:  Note: Objective measures were completed at Evaluation unless otherwise noted.  DIAGNOSTIC FINDINGS:  Head CT:  IMPRESSION: 1. No acute intracranial abnormality. 2. No acute displaced fracture or traumatic listhesis of the cervical spine.  Cervical CT:  MPRESSION: 1. No acute intracranial abnormality. 2. No acute displaced fracture or traumatic listhesis of the cervical spine.  COGNITION: Overall cognitive status: History of cognitive impairments - at baseline   SENSATION: Light touch: Impaired  Proprioception: Impaired  Able to detect depe pressure   COORDINATION: Increased tone on the RLE   EDEMA:  Mild RLE distal edema   MUSCLE TONE: RLE: Mild and Moderate  MUSCLE LENGTH: Hamstrings: grossly limited   POSTURE: rounded shoulders, forward head, and posterior bias   LOWER EXTREMITY ROM:     Active  Right Eval Left Eval  Hip flexion  Limited to 5 deg beyond 90 in sitting  Knee flexion  90deg in sitting  Knee extension  Lacking 10 deg full extension  Ankle dorsiflexion  none  Ankle plantarflexion  none   (Blank rows = not tested)  LOWER EXTREMITY MMT:    MMT Right Eval Left Eval  Hip flexion  2+  Hip extension    Hip abduction  2  Hip adduction  3  Hip internal rotation    Hip external rotation    Knee flexion  2+  Knee extension  3+  Ankle dorsiflexion  0  Ankle plantarflexion  0  Ankle inversion    Ankle eversion    (Blank rows = not tested)  BED MOBILITY:  Sit to supine Mod A Supine to sit Mod A Rolling to Right Min A Rolling to Left Min A  TRANSFERS: Assistive device utilized: Hemi walker  Sit to stand: Mod A Stand to sit: Mod A Chair to chair: Mod A Floor: unable to perform   CURB:  Level of Assistance: Total A Assistive device utilized: rail Curb Comments: unable to perform   GAIT: Gait pattern: non-functional  constant posterior bias , step to pattern, decreased stance time- Right, Right hip hike, lateral hip instability, and lateral lean- Left Distance walked: 55ft Assistive device utilized: Hemi walker Level of assistance: Mod  A Comments: moderate cues for sequencing of HW and gait pattern in turns   FUNCTIONAL TESTS:  5 times sit to stand: 48 sec with mod Assist from PT  Timed up and go (TUG): unable to perform  10 meter walk test: 6 ft with min-mod assist and HW.  FIST:                                                                                                                               TREATMENT DATE: 07/11/2024  TE:  Patient performed self w/c mobilization facing wall - Rocking motion for Right knee flex- (positioned foot against wall- then had patient rock w/c forward/backward to work on her ROM) x 3 min  TA: SIT TO STAND- x 10 reps - focusing on consistency- 8/10 reps with good hand position STAND PIVOT transfer from w/c to green chair and back x 2 each -Some VC for direction and navigation of hemiwalker, CGA, with gait belt.   NMR:   Static stand x 1 min with 1UE support Dynamic lateral weight shifting  x 1 min x 2 trials Static stand x 1 min without UE support Dynamic standing at wall (at support bar) - with dynamic left UE overhead reaching- diagonal to left, center, then to right diagonal= 1 rep- 2 sets of 10 reps     Gait:   Patient ambulated 50 feet using HW, CGA, gait belt and close w/c follow- No cues or physical assist again today with holding right arm.          PATIENT EDUCATION: Education details: .  Pt educated throughout session about proper posture and technique with exercises. Improved exercise technique, movement at target joints, use of target muscles after min to mod verbal, visual, tactile cues Interpretation of outcome measure; need for continued progress to justify continued PT services.   Person educated: Patient Education method:  Explanation Education comprehension: verbalized understanding  HOME EXERCISE PROGRAM: Access Code: [email or web address] URL: [email or web address] Date: 12/05/2023 Prepared by: Massie Dollar  Exercises - Sit to Stand with Counter Support  - 1 x daily - 3-4 x weekly - 2 sets - 10 reps - Seated Long Arc Quad  - 1 x daily - 3-4 x weekly - 2 sets - 10 reps - 2 hold - Seated March  - 1 x daily - 3-4 x weekly - 2 sets - 10 reps - 2 hold - Standing Hip Abduction with Counter Support  - 1 x daily - 7 x weekly - 3 sets - 5 reps - Wide Stance with Counter Support  - 1 x daily - 7 x weekly - 3 sets - 2 reps - 30 hold - Seated Hip Abduction  - 1 x daily - 7 x weekly - 3 sets - 10 reps - Supine Bridge  - 1 x daily - 7 x weekly - 3 sets - 10 reps - Supine Short Arc Quad  - 1 x daily -  7 x weekly - 3 sets - 10 reps - Bent Knee Fallouts  - 1 x daily - 7 x weekly - 3 sets - 10 reps - Small Range Straight Leg Raise  - 1 x daily - 7 x weekly - 3 sets - 10 reps - Supine Heel Slide  - 1 x daily - 7 x weekly - 3 sets - 10 reps  GOALS: Goals reviewed with patient? Yes  SHORT TERM GOALS: Target date: 08/03/2024  Patient will be independent in home exercise program to improve strength/mobility for better functional independence with ADLs. Baseline: to be provided on visit 2: 12/28/2023- No caregiver with patient to determine if compliant with HEP Goal  5/13: daugher states that they are completing some exercises almost daily; 04/09/2024= No caregiver present today to f/u with compliance of HEP- Patient verbalized yes and stated standing, moving my legs. 07/02/2024= upon last conversation with patient dtr at last visit- she is going to talk to the lady who sometimes bring patient to clinic to see if she can do some standing and the HEP at the facility with patient.  status: IN PROGRESS  LONG TERM GOALS: Target date: 09/24/2024  2.  Patient (> 12 years old) will complete five times sit to stand test by 15  seconds and only min assist indicating an increased LE strength and improved balance. Baseline: 48 with mod assist from PT; 12/28/2023= 44 sec from EOM at lowest postion (approx 21 in) with Left UE support and CGA to min A from PT 5/13: 28.80 sec with HW from [facility]; min assist from PT for full erect standing; 04/09/2024= 26.56 sec with HW in front- pushing up using LUE on armrest of w/c- Min Assist for full erect standing 8/26: 25.41 sec with UE pushing from arm rest and reaching for rail on wall; min a to mod assist from PT throughout all repetitions for adequate anterior weight shift; 07/02/2024= 21.98 sec with min physical assist Goal status: PROGRESSING  3.  Patient will increase FIST score by > 6 points to demonstrate decreased fall risk during functional activities Baseline: 37 5/13: 46/56 Goal status: MET  4.  Patient will complete 10 meter walk test to with HW in less than 1 minute with min assist as to improve gait speed for better community ambulation and to reduce fall risk. Baseline: 6 ft with HW and mod assist; 12/28/2023=18 feet with HW- mod assist  5/13: 2:11 min with HW, min-mod assist. 04/09/2024= Patient ambulated approx 22 feet using HW with Min assist and mod VC for gait sequencing-then experienced LOB requiring Mod Assist to maintain standing. 8/26: 2:31min (0 .2m/s) with HW and min fading to mod assist to advance the RLE with fatigue (165 sec); 06/06/2024= 0.85 m/s sec avg with HM with CGA and close w/c follow. 07/02/2024= 78 sec then 73 sec with HW= 0.14 m/s avg.  Goal status: PROGRESSING  5.  Patient will perform stand pivot transfer to and from [facility] with HW and CGA for safety to allow safe transfer to toilet with daughter without use of rail on the wall. Baseline: mod assist and heavy use of rail. 12/28/2023= Min to Mod assist with repetitive VC for technique 5/13: min assist overall with HW; mod assist initially both directions  to prevent posterior LOB. 04/09/2024- Min assist physically  in both directions yet increased VC for safety- if provided more time- patient able to make correct movements 75% of time during transfers- still inconsistent with reaching back and pivoting on  Right LE  8/26: mod assist from PT to the R and L on this day due to posterior LOB  07/02/2204- Min assist from w/c to standard chair with VC for hand placement- no posterior lean on both attempts today- min assist to stand and VC for sequencing.  Goal status: Progressing  6.  Patient will improve bed mobility to min assist only to reduce care giver burden and improve safety of transfers  Baseline: mod-max assist 5/13: Min-mod assist for sit<>supine. Min assist only for RLE management for rolling; 04/09/2024- unable to test due to time  8/26. Mod assist for sit<>supine movement at trunk and RLE and min assist for rolling tasks on this day.  Goal status: ONGOING  ASSESSMENT:  CLINICAL IMPRESSION:  Patient presents with more verbal conversation during past few sessions. Able to speak with short sentences some today. Overall less cues with standing, pivot transfer, and gait sequencing. Patient reports happy to be walking more. Pt will benefit from continued skilled PT to address strength and balance deficits to improve safety for transfers, reduce care giver burden, and allow return to PLOF and possible return home from Assisted living.   OBJECTIVE IMPAIRMENTS: Abnormal gait, cardiopulmonary status limiting activity, decreased activity tolerance, decreased balance, decreased cognition, decreased coordination, decreased endurance, decreased knowledge of use of DME, decreased mobility, difficulty walking, decreased ROM, decreased strength, hypomobility, increased muscle spasms, impaired flexibility, impaired sensation, impaired tone, impaired UE functional use, improper body mechanics, and postural dysfunction.   ACTIVITY LIMITATIONS: carrying, lifting, bending, standing, squatting, stairs, transfers, bed mobility,  continence, bathing, toileting, dressing, reach over head, hygiene/grooming, and locomotion level  PARTICIPATION LIMITATIONS: cleaning, interpersonal relationship, shopping, and community activity  PERSONAL FACTORS: 3+ comorbidities: HTN, chronic CVA, Afib, Lupus  are also affecting patient's functional outcome.   REHAB POTENTIAL: Fair chronic CVA with poor progress in prior interventions with this clinic  CLINICAL DECISION MAKING: Unstable/unpredictable  EVALUATION COMPLEXITY: High  PLAN:  PT FREQUENCY: 1-2x/week  PT DURATION: 12 weeks  PLANNED INTERVENTIONS: 97110-Therapeutic exercises, 97530- Therapeutic activity, [id number]- Neuromuscular re-education, 97535- Self Care, [id number]- Manual therapy, [phone]- Gait training, [phone]- Orthotic Fit/training, [phone]- Aquatic Therapy, [phone]- Splinting, 97014- Electrical stimulation (unattended), [phone]- Electrical stimulation (manual), Patient/Family education, Balance training, Stair training, Joint mobilization, Vestibular training, Visual/preceptual remediation/compensation, Cognitive remediation, DME instructions, Wheelchair mobility training, Cryotherapy, and Moist heat  PLAN FOR NEXT SESSION:   Continue with functional standing Continue with transfer safety Continue with gait training.   Chyrl London, PT Physical Therapist - [facility]  9:38 AM 07/11/24

## 2024-07-12 ENCOUNTER — Encounter: Attending: Physical Medicine and Rehabilitation | Admitting: Physical Medicine and Rehabilitation

## 2024-07-12 ENCOUNTER — Encounter: Payer: Self-pay | Admitting: Physical Medicine and Rehabilitation

## 2024-07-12 VITALS — BP 162/95 | HR 70 | Ht 67.0 in

## 2024-07-12 DIAGNOSIS — I69398 Other sequelae of cerebral infarction: Secondary | ICD-10-CM | POA: Diagnosis not present

## 2024-07-12 DIAGNOSIS — R252 Cramp and spasm: Secondary | ICD-10-CM | POA: Diagnosis not present

## 2024-07-12 DIAGNOSIS — I69351 Hemiplegia and hemiparesis following cerebral infarction affecting right dominant side: Secondary | ICD-10-CM

## 2024-07-12 MED ORDER — SODIUM CHLORIDE (PF) 0.9 % IJ SOLN
3.0000 mL | Freq: Once | INTRAMUSCULAR | Status: AC
  Administered 2024-07-12: 3 mL

## 2024-07-12 MED ORDER — ONABOTULINUMTOXINA 100 UNITS IJ SOLR
300.0000 [IU] | Freq: Once | INTRAMUSCULAR | Status: AC
  Administered 2024-07-12: 300 [IU] via INTRAMUSCULAR

## 2024-07-12 NOTE — Progress Notes (Signed)
 Botox  Injection for spasticity, palpation and activation approach, US  guidance used   Dilution: 1:1 Indication: Severe spasticity which interferes with ADL,mobility and/or  hygiene and is unresponsive to medication management and other conservative care   Informed consent was obtained after describing risks and benefits of the procedure with the patient. This includes bleeding, bruising, infection, excessive weakness, or medication side effects. A REMS form is on file and signed.  1) Flexor digitorum profundus: 50U in 1 site 2) Flexor carpi radialis: 50U U in 1 site 3) Flexor digitorum superficialis: 50U in 1 site 4) Flexor digitorum profundus: 50U 5) Pronator teres: 50U 6) Biceps: 50U     All injections were done after palpation and patient activation of the muscle confirmed the accurate location, and after negative drawback for blood. The patient tolerated the procedure well. Post procedure instructions were given. A followup appointment was made.

## 2024-07-15 NOTE — Therapy (Incomplete)
 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   REFERRING DIAG:  Diagnosis  I69.398,R25.2 (ICD-10-CM) - Spasticity as late effect of cerebrovascular accident (CVA)    THERAPY DIAG:  No diagnosis found.  Rationale for Evaluation and Treatment: Rehabilitation  SUBJECTIVE:                                                                                                                                                                                              SUBJECTIVE STATEMENT: I want to walk and get better.    PERTINENT HISTORY:  Daughter present for Evaluation to provide hx. Pt is familiar to this clinic and has been seen for multiple bouts PT since initial CVA in 2021. Pt d/c'ed from PT serviced in April of 2024 with plans to receive PT services through [facility]. Daughter reports that she was only receiving minimal therapy in facility. Reports at least 1 fall in the last 6 months and daughter asked staff to transfer pt with lift since fall.  Daughter would want pt to demonstrate improve safety with  transfers to allow bil transfers with reduced use of rail to allow pt to return home for assisted living.    PAIN:  Are you having pain? No  PRECAUTIONS: Fall  RED FLAGS: None   WEIGHT BEARING RESTRICTIONS: No  FALLS: Has patient fallen in last 6 months? Yes. Number of falls 1  LIVING ENVIRONMENT: Lives with: lives with their family and lives in a skilled nursing facility Lives in: Other SNF  Stairs: No Has following equipment at home: Hemi walker and Wheelchair (manual)  PLOF: Requires assistive device for independence, Needs assistance with ADLs, Needs assistance with homemaking, and WC level currently   PATIENT GOALS: move better - walking or transferring with hemi walking   OBJECTIVE:  Note: Objective measures were completed at Evaluation unless otherwise noted.  DIAGNOSTIC FINDINGS:  Head CT:  IMPRESSION: 1. No acute intracranial abnormality. 2. No acute displaced fracture or traumatic listhesis of the cervical spine.  Cervical CT:  MPRESSION: 1. No acute intracranial abnormality. 2. No acute displaced fracture or traumatic listhesis of the cervical spine.  COGNITION: Overall cognitive status: History of cognitive impairments - at baseline   SENSATION: Light touch: Impaired  Proprioception: Impaired  Able to detect depe pressure   COORDINATION: Increased tone on the RLE   EDEMA:  Mild RLE  distal edema   MUSCLE TONE: RLE: Mild and Moderate  MUSCLE LENGTH: Hamstrings: grossly limited   POSTURE: rounded shoulders, forward head, and posterior bias   LOWER EXTREMITY ROM:     Active  Right Eval Left Eval  Hip flexion  Limited to 5 deg beyond 90 in sitting  Knee flexion  90deg in sitting  Knee extension  Lacking 10 deg full extension  Ankle dorsiflexion  none  Ankle plantarflexion  none   (Blank rows = not tested)  LOWER EXTREMITY MMT:    MMT Right Eval Left Eval  Hip flexion  2+  Hip extension    Hip abduction  2  Hip adduction  3  Hip internal rotation    Hip external rotation    Knee flexion  2+  Knee extension  3+  Ankle dorsiflexion  0  Ankle plantarflexion  0  Ankle inversion    Ankle eversion    (Blank rows = not tested)  BED MOBILITY:  Sit to supine Mod A Supine to sit Mod A Rolling to Right Min A Rolling to Left Min A  TRANSFERS: Assistive device utilized: Hemi walker  Sit to stand: Mod A Stand to sit: Mod A Chair to chair: Mod A Floor: unable to perform   CURB:  Level of Assistance: Total A Assistive device utilized: rail Curb Comments: unable to perform   GAIT: Gait pattern: non-functional constant posterior bias , step to pattern, decreased stance time- Right, Right hip hike, lateral hip instability, and lateral lean- Left Distance walked: 68ft Assistive device utilized: Hemi walker Level of assistance: Mod A Comments: moderate cues for sequencing of HW and gait pattern in turns   FUNCTIONAL TESTS:  5 times sit to stand: 48 sec with mod Assist from PT  Timed up and go (TUG): unable to perform  10 meter walk test: 6 ft with min-mod assist and HW.  FIST:  TREATMENT DATE: 07/15/2024  TE:  Patient performed self w/c mobilization facing wall - Rocking motion for Right knee flex- (positioned foot against  wall- then had patient rock w/c forward/backward to work on her ROM) x 3 min  TA: SIT TO STAND- x 10 reps - focusing on consistency- 8/10 reps with good hand position STAND PIVOT transfer from w/c to green chair and back x 2 each -Some VC for direction and navigation of hemiwalker, CGA, with gait belt.   NMR:   Static stand x 1 min with 1UE support Dynamic lateral weight shifting  x 1 min x 2 trials Static stand x 1 min without UE support Dynamic standing at wall (at support bar) - with dynamic left UE overhead reaching- diagonal to left, center, then to right diagonal= 1 rep- 2 sets of 10 reps     Gait:   Patient ambulated 50 feet using HW, CGA, gait belt and close w/c follow- No cues or physical assist again today with holding right arm.          PATIENT EDUCATION: Education details: .  Pt educated throughout session about proper posture and technique with exercises. Improved exercise technique, movement at target joints, use of target muscles after min to mod verbal, visual, tactile cues Interpretation of outcome measure; need for continued progress to justify continued PT services.   Person educated: Patient Education method: Explanation Education comprehension: verbalized understanding  HOME EXERCISE PROGRAM: Access Code: [email or web address] URL: [email or web address] Date: 12/05/2023 Prepared by: Massie Dollar  Exercises - Sit to Stand with Counter Support  - 1 x daily - 3-4 x weekly - 2 sets - 10 reps - Seated Long Arc Quad  - 1 x daily - 3-4 x weekly - 2 sets - 10 reps - 2 hold - Seated March  - 1 x daily - 3-4 x weekly - 2 sets - 10 reps - 2 hold - Standing Hip Abduction with Counter Support  - 1 x daily - 7 x weekly - 3 sets - 5 reps - Wide Stance with Counter Support  - 1 x daily - 7 x weekly - 3 sets - 2 reps - 30 hold - Seated Hip Abduction  - 1 x daily - 7 x weekly - 3 sets - 10 reps - Supine Bridge  - 1 x daily - 7 x weekly - 3 sets - 10 reps -  Supine Short Arc Quad  - 1 x daily - 7 x weekly - 3 sets - 10 reps - Bent Knee Fallouts  - 1 x daily - 7 x weekly - 3 sets - 10 reps - Small Range Straight Leg Raise  - 1 x daily - 7 x weekly - 3 sets - 10 reps - Supine Heel Slide  - 1 x daily - 7 x weekly - 3 sets - 10 reps  GOALS: Goals reviewed with patient? Yes  SHORT TERM GOALS: Target date: 08/03/2024  Patient will be independent in home exercise program to improve strength/mobility for better functional independence with ADLs. Baseline: to be provided on visit 2: 12/28/2023- No caregiver with patient to determine if compliant with HEP Goal  5/13: daugher states that they are completing some exercises almost daily; 04/09/2024= No caregiver present today to f/u with compliance of HEP- Patient verbalized yes and stated standing, moving my legs. 07/02/2024= upon last conversation with patient dtr at last visit- she is going to talk to the lady who sometimes bring patient to clinic to see if  she can do some standing and the HEP at the facility with patient.  status: IN PROGRESS  LONG TERM GOALS: Target date: 09/24/2024  2.  Patient (> 6 years old) will complete five times sit to stand test by 15 seconds and only min assist indicating an increased LE strength and improved balance. Baseline: 48 with mod assist from PT; 12/28/2023= 44 sec from EOM at lowest postion (approx 21 in) with Left UE support and CGA to min A from PT 5/13: 28.80 sec with HW from [facility]; min assist from PT for full erect standing; 04/09/2024= 26.56 sec with HW in front- pushing up using LUE on armrest of w/c- Min Assist for full erect standing 8/26: 25.41 sec with UE pushing from arm rest and reaching for rail on wall; min a to mod assist from PT throughout all repetitions for adequate anterior weight shift; 07/02/2024= 21.98 sec with min physical assist Goal status: PROGRESSING  3.  Patient will increase FIST score by > 6 points to demonstrate decreased fall risk during functional  activities Baseline: 37 5/13: 46/56 Goal status: MET  4.  Patient will complete 10 meter walk test to with HW in less than 1 minute with min assist as to improve gait speed for better community ambulation and to reduce fall risk. Baseline: 6 ft with HW and mod assist; 12/28/2023=18 feet with HW- mod assist  5/13: 2:11 min with HW, min-mod assist. 04/09/2024= Patient ambulated approx 22 feet using HW with Min assist and mod VC for gait sequencing-then experienced LOB requiring Mod Assist to maintain standing. 8/26: 2:58min (0 .35m/s) with HW and min fading to mod assist to advance the RLE with fatigue (165 sec); 06/06/2024= 0.85 m/s sec avg with HM with CGA and close w/c follow. 07/02/2024= 78 sec then 73 sec with HW= 0.14 m/s avg.  Goal status: PROGRESSING  5.  Patient will perform stand pivot transfer to and from [facility] with HW and CGA for safety to allow safe transfer to toilet with daughter without use of rail on the wall. Baseline: mod assist and heavy use of rail. 12/28/2023= Min to Mod assist with repetitive VC for technique 5/13: min assist overall with HW; mod assist initially both directions  to prevent posterior LOB. 04/09/2024- Min assist physically in both directions yet increased VC for safety- if provided more time- patient able to make correct movements 75% of time during transfers- still inconsistent with reaching back and pivoting on Right LE  8/26: mod assist from PT to the R and L on this day due to posterior LOB  07/02/2204- Min assist from w/c to standard chair with VC for hand placement- no posterior lean on both attempts today- min assist to stand and VC for sequencing.  Goal status: Progressing  6.  Patient will improve bed mobility to min assist only to reduce care giver burden and improve safety of transfers  Baseline: mod-max assist 5/13: Min-mod assist for sit<>supine. Min assist only for RLE management for rolling; 04/09/2024- unable to test due to time  8/26. Mod assist for  sit<>supine movement at trunk and RLE and min assist for rolling tasks on this day.  Goal status: ONGOING  ASSESSMENT:  CLINICAL IMPRESSION:  Patient presents with more verbal conversation during past few sessions. Able to speak with short sentences some today. Overall less cues with standing, pivot transfer, and gait sequencing. Patient reports happy to be walking more. Pt will benefit from continued skilled PT to address strength and balance deficits to  improve safety for transfers, reduce care giver burden, and allow return to PLOF and possible return home from Assisted living.   OBJECTIVE IMPAIRMENTS: Abnormal gait, cardiopulmonary status limiting activity, decreased activity tolerance, decreased balance, decreased cognition, decreased coordination, decreased endurance, decreased knowledge of use of DME, decreased mobility, difficulty walking, decreased ROM, decreased strength, hypomobility, increased muscle spasms, impaired flexibility, impaired sensation, impaired tone, impaired UE functional use, improper body mechanics, and postural dysfunction.   ACTIVITY LIMITATIONS: carrying, lifting, bending, standing, squatting, stairs, transfers, bed mobility, continence, bathing, toileting, dressing, reach over head, hygiene/grooming, and locomotion level  PARTICIPATION LIMITATIONS: cleaning, interpersonal relationship, shopping, and community activity  PERSONAL FACTORS: 3+ comorbidities: HTN, chronic CVA, Afib, Lupus  are also affecting patient's functional outcome.   REHAB POTENTIAL: Fair chronic CVA with poor progress in prior interventions with this clinic  CLINICAL DECISION MAKING: Unstable/unpredictable  EVALUATION COMPLEXITY: High  PLAN:  PT FREQUENCY: 1-2x/week  PT DURATION: 12 weeks  PLANNED INTERVENTIONS: 97110-Therapeutic exercises, 97530- Therapeutic activity, [id number]- Neuromuscular re-education, 97535- Self Care, [id number]- Manual therapy, [phone]- Gait training, [phone]- Orthotic  Fit/training, [phone]- Aquatic Therapy, [phone]- Splinting, [id number]- Electrical stimulation (unattended), [phone]- Electrical stimulation (manual), Patient/Family education, Balance training, Stair training, Joint mobilization, Vestibular training, Visual/preceptual remediation/compensation, Cognitive remediation, DME instructions, Wheelchair mobility training, Cryotherapy, and Moist heat  PLAN FOR NEXT SESSION:   Continue with functional standing Continue with transfer safety Continue with gait training.   Chyrl London, PT Physical Therapist - [facility]  5:01 PM 07/15/24

## 2024-07-16 ENCOUNTER — Encounter: Admitting: Speech Pathology

## 2024-07-16 ENCOUNTER — Ambulatory Visit: Admitting: Occupational Therapy

## 2024-07-16 ENCOUNTER — Ambulatory Visit

## 2024-07-18 ENCOUNTER — Ambulatory Visit

## 2024-07-18 ENCOUNTER — Ambulatory Visit: Admitting: Occupational Therapy

## 2024-07-18 ENCOUNTER — Encounter: Admitting: Speech Pathology

## 2024-07-23 ENCOUNTER — Ambulatory Visit: Admitting: Occupational Therapy

## 2024-07-23 ENCOUNTER — Ambulatory Visit: Admitting: Physical Therapy

## 2024-07-23 ENCOUNTER — Encounter: Admitting: Speech Pathology

## 2024-07-23 DIAGNOSIS — R2689 Other abnormalities of gait and mobility: Secondary | ICD-10-CM

## 2024-07-23 DIAGNOSIS — R262 Difficulty in walking, not elsewhere classified: Secondary | ICD-10-CM

## 2024-07-23 DIAGNOSIS — R269 Unspecified abnormalities of gait and mobility: Secondary | ICD-10-CM

## 2024-07-23 DIAGNOSIS — R2681 Unsteadiness on feet: Secondary | ICD-10-CM

## 2024-07-23 DIAGNOSIS — M6281 Muscle weakness (generalized): Secondary | ICD-10-CM | POA: Diagnosis not present

## 2024-07-23 NOTE — Therapy (Unsigned)
 OUTPATIENT PHYSICAL THERAPY TREATMENT   Patient Name: Lindsey [name] MRN: [id number] DOB:03-31-1956, 68 y.o., female Today's Date: 07/23/2024  PCP: Lorel Maxie LABOR, MD  REFERRING PROVIDER: Lorilee Sven SQUIBB, MD   END OF SESSION:   PT End of Session - 07/23/24 1540     Visit Number 52    Number of Visits 72    Date for Recertification  09/24/24    Authorization Type Medicare/Medicaid    Progress Note Due on Visit 60    PT Start Time 1533    PT Stop Time 1612    PT Time Calculation (min) 39 min    Equipment Utilized During Treatment Gait belt    Activity Tolerance Patient tolerated treatment well;No increased pain;Patient limited by fatigue    Behavior During Therapy [facility] for tasks assessed/performed                      Past Medical History:  Diagnosis Date   Aphasia    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   REFERRING DIAG:  Diagnosis  I69.398,R25.2 (ICD-10-CM) - Spasticity as late effect of cerebrovascular accident (CVA)    THERAPY DIAG:  Unsteadiness on feet  Difficulty in walking, not elsewhere classified  Abnormality of gait and mobility  Other abnormalities of gait and mobility  Rationale for Evaluation and Treatment: Rehabilitation  SUBJECTIVE:  SUBJECTIVE STATEMENT: I want to walk and get better.    PERTINENT HISTORY:  Daughter present for Evaluation to provide hx. Pt is familiar to this clinic and has been seen for multiple bouts PT since initial CVA in 2021. Pt d/c'ed from PT serviced in April of 2024 with plans to receive PT services through [facility]. Daughter reports that she was only receiving minimal therapy in facility. Reports at least 1 fall in the last 6 months and daughter asked staff to transfer pt with lift since fall.  Daughter would want pt to demonstrate improve safety with transfers to allow bil transfers with reduced use of rail to allow pt to return home for assisted living.    PAIN:  Are you having pain? No  PRECAUTIONS: Fall  RED FLAGS: None   WEIGHT BEARING RESTRICTIONS: No  FALLS: Has patient fallen in last 6 months? Yes. Number of falls 1  LIVING ENVIRONMENT: Lives with: lives with their family and lives in a skilled nursing facility Lives in: Other SNF  Stairs: No Has following equipment at home: Hemi walker and Wheelchair (manual)  PLOF: Requires assistive device for independence, Needs assistance with ADLs, Needs assistance with homemaking, and WC level currently   PATIENT GOALS: move better - walking or transferring with hemi walking   OBJECTIVE:  Note:  Objective measures were completed at Evaluation unless otherwise noted.  DIAGNOSTIC FINDINGS:  Head CT:  IMPRESSION: 1. No acute intracranial abnormality. 2. No acute displaced fracture or traumatic listhesis of the cervical spine.  Cervical CT:  MPRESSION: 1. No acute intracranial abnormality. 2. No acute displaced fracture or traumatic listhesis of the cervical spine.  COGNITION: Overall cognitive status: History of cognitive impairments - at baseline   SENSATION: Light touch: Impaired  Proprioception: Impaired  Able to detect depe pressure   COORDINATION: Increased tone on the RLE   EDEMA:  Mild RLE distal edema   MUSCLE TONE: RLE: Mild and Moderate  MUSCLE LENGTH: Hamstrings: grossly limited   POSTURE: rounded shoulders, forward head, and posterior bias   LOWER EXTREMITY ROM:     Active  Right Eval Left Eval  Hip flexion  Limited to 5 deg beyond 90 in sitting  Knee flexion  90deg in sitting  Knee extension  Lacking 10 deg full extension  Ankle dorsiflexion  none  Ankle plantarflexion  none   (Blank rows = not tested)  LOWER EXTREMITY MMT:    MMT Right Eval Left Eval  Hip flexion  2+  Hip extension    Hip abduction  2  Hip adduction  3  Hip internal rotation    Hip external rotation    Knee flexion  2+  Knee extension  3+  Ankle dorsiflexion  0  Ankle plantarflexion  0  Ankle inversion    Ankle eversion    (Blank rows = not tested)  BED MOBILITY:  Sit to supine Mod A Supine to sit Mod A Rolling to Right Min A Rolling to Left Min A  TRANSFERS: Assistive device utilized: Hemi walker  Sit to stand: Mod A Stand to sit: Mod A Chair to chair: Mod A Floor: unable to perform   CURB:  Level of Assistance: Total A Assistive device utilized: rail Curb Comments: unable to perform   GAIT: Gait pattern: non-functional constant posterior bias , step to pattern, decreased stance time- Right, Right hip hike, lateral hip instability, and lateral  lean- Left Distance walked: 12ft Assistive device utilized: Hemi walker Level of assistance: Mod  A Comments: moderate cues for sequencing of HW and gait pattern in turns   FUNCTIONAL TESTS:  5 times sit to stand: 48 sec with mod Assist from PT  Timed up and go (TUG): unable to perform  10 meter walk test: 6 ft with min-mod assist and HW.  FIST:                                                                                                                               TREATMENT DATE: 07/23/2024  All in // bars   TA:  STS in // bars then 10 L to R weight shifts then 60 sec standi withut UE support - cues from PT to place R LE in adequate position for transition to standing.  Rest  SIT TO STAND- with R UE on // bar for support  Forward and retro gait in // bars, Close CGA forward and min A retro gait for weight shift and R LE propulsion  Rest Then another round of above but pt having more difficulty with R LE propulsion for both forward and especially retro gait; PT also has to assis twith keeping pt pants up as pants do not adequately fit.  Rest STS x 10 with push from [facility] and PT assist to maintain R LE positioning; had pt assist with initial positioning between sets  Rest Seated R LE LAQ qith focussed HS activation to return to starting position for improved ability to prepare for STS Rest Standing weight shift with NO UE support but close CGA to Min A  Pt required occasional rest breaks due fatigue, PT was attentive to when pt appeared to be tired or winded in order to prevent excessive fatigue.           PATIENT EDUCATION: Education details: .  Pt educated throughout session about proper posture and technique with exercises. Improved exercise technique, movement at target joints, use of target muscles after min to mod verbal, visual, tactile cues Interpretation of outcome measure; need for continued progress to justify continued PT services.   Person educated:  Patient Education method: Explanation Education comprehension: verbalized understanding  HOME EXERCISE PROGRAM: Access Code: [email or web address] URL: [email or web address] Date: 12/05/2023 Prepared by: Massie Dollar  Exercises - Sit to Stand with Counter Support  - 1 x daily - 3-4 x weekly - 2 sets - 10 reps - Seated Long Arc Quad  - 1 x daily - 3-4 x weekly - 2 sets - 10 reps - 2 hold - Seated March  - 1 x daily - 3-4 x weekly - 2 sets - 10 reps - 2 hold - Standing Hip Abduction with Counter Support  - 1 x daily - 7 x weekly - 3 sets - 5 reps - Wide Stance with Counter Support  - 1 x daily - 7 x weekly - 3 sets - 2 reps - 30 hold - Seated Hip Abduction  - 1 x daily - 7 x  weekly - 3 sets - 10 reps - Supine Bridge  - 1 x daily - 7 x weekly - 3 sets - 10 reps - Supine Short Arc Quad  - 1 x daily - 7 x weekly - 3 sets - 10 reps - Bent Knee Fallouts  - 1 x daily - 7 x weekly - 3 sets - 10 reps - Small Range Straight Leg Raise  - 1 x daily - 7 x weekly - 3 sets - 10 reps - Supine Heel Slide  - 1 x daily - 7 x weekly - 3 sets - 10 reps  GOALS: Goals reviewed with patient? Yes  SHORT TERM GOALS: Target date: 08/03/2024  Patient will be independent in home exercise program to improve strength/mobility for better functional independence with ADLs. Baseline: to be provided on visit 2: 12/28/2023- No caregiver with patient to determine if compliant with HEP Goal  5/13: daugher states that they are completing some exercises almost daily; 04/09/2024= No caregiver present today to f/u with compliance of HEP- Patient verbalized yes and stated standing, moving my legs. 07/02/2024= upon last conversation with patient dtr at last visit- she is going to talk to the lady who sometimes bring patient to clinic to see if she can do some standing and the HEP at the facility with patient.  status: IN PROGRESS  LONG TERM GOALS: Target date: 09/24/2024  2.  Patient (> 47 years old) will complete five times sit  to stand test by 15 seconds and only min assist indicating an increased LE strength and improved balance. Baseline: 48 with mod assist from PT; 12/28/2023= 44 sec from EOM at lowest postion (approx 21 in) with Left UE support and CGA to min A from PT 5/13: 28.80 sec with HW from [facility]; min assist from PT for full erect standing; 04/09/2024= 26.56 sec with HW in front- pushing up using LUE on armrest of w/c- Min Assist for full erect standing 8/26: 25.41 sec with UE pushing from arm rest and reaching for rail on wall; min a to mod assist from PT throughout all repetitions for adequate anterior weight shift; 07/02/2024= 21.98 sec with min physical assist Goal status: PROGRESSING  3.  Patient will increase FIST score by > 6 points to demonstrate decreased fall risk during functional activities Baseline: 37 5/13: 46/56 Goal status: MET  4.  Patient will complete 10 meter walk test to with HW in less than 1 minute with min assist as to improve gait speed for better community ambulation and to reduce fall risk. Baseline: 6 ft with HW and mod assist; 12/28/2023=18 feet with HW- mod assist  5/13: 2:11 min with HW, min-mod assist. 04/09/2024= Patient ambulated approx 22 feet using HW with Min assist and mod VC for gait sequencing-then experienced LOB requiring Mod Assist to maintain standing. 8/26: 2:59min (0 .52m/s) with HW and min fading to mod assist to advance the RLE with fatigue (165 sec); 06/06/2024= 0.85 m/s sec avg with HM with CGA and close w/c follow. 07/02/2024= 78 sec then 73 sec with HW= 0.14 m/s avg.  Goal status: PROGRESSING  5.  Patient will perform stand pivot transfer to and from [facility] with HW and CGA for safety to allow safe transfer to toilet with daughter without use of rail on the wall. Baseline: mod assist and heavy use of rail. 12/28/2023= Min to Mod assist with repetitive VC for technique 5/13: min assist overall with HW; mod assist initially both directions  to prevent posterior LOB.  04/09/2024-  Min assist physically in both directions yet increased VC for safety- if provided more time- patient able to make correct movements 75% of time during transfers- still inconsistent with reaching back and pivoting on Right LE  8/26: mod assist from PT to the R and L on this day due to posterior LOB  07/02/2204- Min assist from w/c to standard chair with VC for hand placement- no posterior lean on both attempts today- min assist to stand and VC for sequencing.  Goal status: Progressing  6.  Patient will improve bed mobility to min assist only to reduce care giver burden and improve safety of transfers  Baseline: mod-max assist 5/13: Min-mod assist for sit<>supine. Min assist only for RLE management for rolling; 04/09/2024- unable to test due to time  8/26. Mod assist for sit<>supine movement at trunk and RLE and min assist for rolling tasks on this day.  Goal status: ONGOING  ASSESSMENT:  CLINICAL IMPRESSION:  Patient presents with good motivation for completion of physical therapy activities.  Patient still continues to show fatigue with standing and gait activities but is eager to get better and motivated to participate.  Patient having difficulty with right lower extremity propulsion and swing phase as well as with sequencing weight shift and right lower extremity propulsion requires assistance and frequent verbal cues to improve this.  With retrogait patient has very high difficulty with activity as well due to the same reasons assisted above.  Pt will benefit from continued skilled PT to address strength and balance deficits to improve safety for transfers, reduce care giver burden, and allow return to PLOF and possible return home from Assisted living.   OBJECTIVE IMPAIRMENTS: Abnormal gait, cardiopulmonary status limiting activity, decreased activity tolerance, decreased balance, decreased cognition, decreased coordination, decreased endurance, decreased knowledge of use of DME, decreased mobility,  difficulty walking, decreased ROM, decreased strength, hypomobility, increased muscle spasms, impaired flexibility, impaired sensation, impaired tone, impaired UE functional use, improper body mechanics, and postural dysfunction.   ACTIVITY LIMITATIONS: carrying, lifting, bending, standing, squatting, stairs, transfers, bed mobility, continence, bathing, toileting, dressing, reach over head, hygiene/grooming, and locomotion level  PARTICIPATION LIMITATIONS: cleaning, interpersonal relationship, shopping, and community activity  PERSONAL FACTORS: 3+ comorbidities: HTN, chronic CVA, Afib, Lupus  are also affecting patient's functional outcome.   REHAB POTENTIAL: Fair chronic CVA with poor progress in prior interventions with this clinic  CLINICAL DECISION MAKING: Unstable/unpredictable  EVALUATION COMPLEXITY: High  PLAN:  PT FREQUENCY: 1-2x/week  PT DURATION: 12 weeks  PLANNED INTERVENTIONS: 97110-Therapeutic exercises, 97530- Therapeutic activity, [id number]- Neuromuscular re-education, 97535- Self Care, [id number]- Manual therapy, [phone]- Gait training, [phone]- Orthotic Fit/training, [phone]- Aquatic Therapy, [phone]- Splinting, [id number]- Electrical stimulation (unattended), [phone]- Electrical stimulation (manual), Patient/Family education, Balance training, Stair training, Joint mobilization, Vestibular training, Visual/preceptual remediation/compensation, Cognitive remediation, DME instructions, Wheelchair mobility training, Cryotherapy, and Moist heat  PLAN FOR NEXT SESSION:   Continue with functional standing Continue with transfer safety Continue with gait training.   Note: Portions of this document were prepared using Dragon voice recognition software and although reviewed may contain unintentional dictation errors in syntax, grammar, or spelling.  Lonni KATHEE Gainer PT ,DPT Physical Therapist- [place]  [facility]   3:41 PM 07/23/24

## 2024-07-25 ENCOUNTER — Ambulatory Visit: Admitting: Occupational Therapy

## 2024-07-25 ENCOUNTER — Encounter: Admitting: Speech Pathology

## 2024-07-25 ENCOUNTER — Ambulatory Visit

## 2024-07-30 ENCOUNTER — Ambulatory Visit

## 2024-07-30 DIAGNOSIS — M6281 Muscle weakness (generalized): Secondary | ICD-10-CM | POA: Diagnosis not present

## 2024-07-30 DIAGNOSIS — R262 Difficulty in walking, not elsewhere classified: Secondary | ICD-10-CM

## 2024-07-30 DIAGNOSIS — R41841 Cognitive communication deficit: Secondary | ICD-10-CM

## 2024-07-30 DIAGNOSIS — R2681 Unsteadiness on feet: Secondary | ICD-10-CM

## 2024-07-30 DIAGNOSIS — R278 Other lack of coordination: Secondary | ICD-10-CM

## 2024-07-30 DIAGNOSIS — R2689 Other abnormalities of gait and mobility: Secondary | ICD-10-CM

## 2024-07-30 DIAGNOSIS — R482 Apraxia: Secondary | ICD-10-CM

## 2024-07-30 DIAGNOSIS — R269 Unspecified abnormalities of gait and mobility: Secondary | ICD-10-CM

## 2024-07-30 NOTE — Therapy (Signed)
 OUTPATIENT PHYSICAL THERAPY TREATMENT   Patient Name: Lindsey [name] MRN: [id number] DOB:1955-11-20, 68 y.o., female Today's Date: 07/30/2024  PCP: Lorel Maxie LABOR, MD  REFERRING PROVIDER: Lorilee Sven SQUIBB, MD   END OF SESSION:   PT End of Session - 07/30/24 0944     Visit Number 53    Number of Visits 72    Date for Recertification  09/24/24    Authorization Type Medicare/Medicaid    Progress Note Due on Visit 60    PT Start Time 0854    PT Stop Time 0930    PT Time Calculation (min) 36 min    Equipment Utilized During Treatment Gait belt    Activity Tolerance Patient tolerated treatment well;No increased pain;Patient limited by fatigue    Behavior During Therapy [facility] for tasks assessed/performed                      Past Medical History:  Diagnosis Date   Aphasia    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 No pertinent surgical history. 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   REFERRING DIAG:  Diagnosis  I69.398,R25.2 (ICD-10-CM) - Spasticity as late effect of cerebrovascular accident (CVA)    THERAPY DIAG:  Unsteadiness on feet  Difficulty in walking, not elsewhere classified  Abnormality of gait and mobility  Other abnormalities of gait and mobility  Muscle weakness (generalized)  Other lack of coordination  Apraxia  Cognitive communication deficit  Rationale for Evaluation and Treatment: Rehabilitation  SUBJECTIVE:  SUBJECTIVE STATEMENT: Patient denies any pain and states no falls at home.   PERTINENT HISTORY:  Daughter present for Evaluation to provide hx. Pt is familiar to this clinic and has been seen for multiple bouts PT since initial CVA in 2021. Pt d/c'ed from PT serviced in April of 2024 with plans to receive PT services through [facility]. Daughter reports that she was only receiving minimal therapy in facility. Reports at least 1 fall in the last 6 months and daughter asked staff to transfer pt with lift since fall.  Daughter would want pt to demonstrate improve safety with transfers to allow bil transfers with reduced use of rail to allow pt to return home for assisted living.    PAIN:  Are you having pain? No  PRECAUTIONS: Fall  RED FLAGS: None   WEIGHT BEARING RESTRICTIONS: No  FALLS: Has patient fallen in last 6 months? Yes. Number of falls 1  LIVING ENVIRONMENT: Lives with: lives with their family and lives in a skilled nursing facility Lives in: Other SNF  Stairs: No Has following equipment at home: Hemi walker and Wheelchair (manual)  PLOF: Requires assistive device for independence, Needs assistance with ADLs, Needs  assistance with homemaking, and WC level currently   PATIENT GOALS: move better - walking or transferring with hemi walking   OBJECTIVE:  Note: Objective measures were completed at Evaluation unless otherwise noted.  DIAGNOSTIC FINDINGS:  Head CT:  IMPRESSION: 1. No acute intracranial abnormality. 2. No acute displaced fracture or traumatic listhesis of the cervical spine.  Cervical CT:  MPRESSION: 1. No acute intracranial abnormality. 2. No acute displaced fracture or traumatic listhesis of the cervical spine.  COGNITION: Overall cognitive status: History of cognitive impairments - at baseline   SENSATION: Light touch: Impaired  Proprioception: Impaired  Able to detect depe pressure   COORDINATION: Increased tone on the RLE   EDEMA:  Mild RLE distal edema   MUSCLE TONE: RLE: Mild and Moderate  MUSCLE LENGTH: Hamstrings: grossly limited   POSTURE: rounded shoulders, forward head, and posterior bias   LOWER EXTREMITY ROM:     Active  Right Eval Left Eval  Hip flexion  Limited to 5 deg beyond 90 in sitting  Knee flexion  90deg in sitting  Knee extension  Lacking 10 deg full extension  Ankle dorsiflexion  none  Ankle plantarflexion  none   (Blank rows = not tested)  LOWER EXTREMITY MMT:    MMT Right Eval Left Eval  Hip flexion  2+  Hip extension    Hip abduction  2  Hip adduction  3  Hip internal rotation    Hip external rotation    Knee flexion  2+  Knee extension  3+  Ankle dorsiflexion  0  Ankle plantarflexion  0  Ankle inversion    Ankle eversion    (Blank rows = not tested)  BED MOBILITY:  Sit to supine Mod A Supine to sit Mod A Rolling to Right Min A Rolling to Left Min A  TRANSFERS: Assistive device utilized: Hemi walker  Sit to stand: Mod A Stand to sit: Mod A Chair to chair: Mod A Floor: unable to perform   CURB:  Level of Assistance: Total A Assistive device utilized: rail Curb Comments: unable to perform   GAIT: Gait  pattern: non-functional constant posterior bias , step to pattern, decreased stance time- Right, Right hip hike, lateral hip instability, and lateral lean- Left Distance walked: 58ft Assistive device utilized: Hemi walker Level of  assistance: Mod A Comments: moderate cues for sequencing of HW and gait pattern in turns   FUNCTIONAL TESTS:  5 times sit to stand: 48 sec with mod Assist from PT  Timed up and go (TUG): unable to perform  10 meter walk test: 6 ft with min-mod assist and HW.  FIST:                                                                                                                               TREATMENT DATE: 07/30/2024  At Support bar/wall area  TA: Patient was wheeled up with 2 feet at wall (sitting in w/c) rocking fwd/back to stretch her RLE (secondary to extensor tone/tightness)   TA: STS at support bar x 5 with VC for hand placement. Static stand at support bar x 4 min with VC to hold head up and equal feet width.  Progressed to dynamic weight shifting x 1 min L to R  Static stand without UE support - cues from PT to place R LE in adequate position for transition to standing x  2 min Rest   Gait:  Patient ambulated 60 feet with hemiwalker, CGA to support R UE, VC for sequencing. Patient able to demonstrate mostly step to gait today and several mini standing rest breaks. (Dtr performed close w/c follow)  She rested a couple of min then ambulated another 35 feet with less overall VC yet visibly fatigued after walk.   Pt required occasional rest breaks due fatigue, PT was attentive to when pt appeared to be tired or winded in order to prevent excessive fatigue.           PATIENT EDUCATION: Education details: .  Pt educated throughout session about proper posture and technique with exercises. Improved exercise technique, movement at target joints, use of target muscles after min to mod verbal, visual, tactile cues Interpretation of outcome measure;  need for continued progress to justify continued PT services.   Person educated: Patient Education method: Explanation Education comprehension: verbalized understanding  HOME EXERCISE PROGRAM: Access Code: [email or web address] URL: [email or web address] Date: 12/05/2023 Prepared by: Massie Dollar  Exercises - Sit to Stand with Counter Support  - 1 x daily - 3-4 x weekly - 2 sets - 10 reps - Seated Long Arc Quad  - 1 x daily - 3-4 x weekly - 2 sets - 10 reps - 2 hold - Seated March  - 1 x daily - 3-4 x weekly - 2 sets - 10 reps - 2 hold - Standing Hip Abduction with Counter Support  - 1 x daily - 7 x weekly - 3 sets - 5 reps - Wide Stance with Counter Support  - 1 x daily - 7 x weekly - 3 sets - 2 reps - 30 hold - Seated Hip Abduction  - 1 x daily - 7 x weekly - 3 sets - 10 reps - Supine Bridge  - 1 x daily - 7 x  weekly - 3 sets - 10 reps - Supine Short Arc Quad  - 1 x daily - 7 x weekly - 3 sets - 10 reps - Bent Knee Fallouts  - 1 x daily - 7 x weekly - 3 sets - 10 reps - Small Range Straight Leg Raise  - 1 x daily - 7 x weekly - 3 sets - 10 reps - Supine Heel Slide  - 1 x daily - 7 x weekly - 3 sets - 10 reps  GOALS: Goals reviewed with patient? Yes  SHORT TERM GOALS: Target date: 08/03/2024  Patient will be independent in home exercise program to improve strength/mobility for better functional independence with ADLs. Baseline: to be provided on visit 2: 12/28/2023- No caregiver with patient to determine if compliant with HEP Goal  5/13: daugher states that they are completing some exercises almost daily; 04/09/2024= No caregiver present today to f/u with compliance of HEP- Patient verbalized yes and stated standing, moving my legs. 07/02/2024= upon last conversation with patient dtr at last visit- she is going to talk to the lady who sometimes bring patient to clinic to see if she can do some standing and the HEP at the facility with patient.  status: IN PROGRESS  LONG TERM GOALS:  Target date: 09/24/2024  2.  Patient (> 10 years old) will complete five times sit to stand test by 15 seconds and only min assist indicating an increased LE strength and improved balance. Baseline: 48 with mod assist from PT; 12/28/2023= 44 sec from EOM at lowest postion (approx 21 in) with Left UE support and CGA to min A from PT 5/13: 28.80 sec with HW from [facility]; min assist from PT for full erect standing; 04/09/2024= 26.56 sec with HW in front- pushing up using LUE on armrest of w/c- Min Assist for full erect standing 8/26: 25.41 sec with UE pushing from arm rest and reaching for rail on wall; min a to mod assist from PT throughout all repetitions for adequate anterior weight shift; 07/02/2024= 21.98 sec with min physical assist Goal status: PROGRESSING  3.  Patient will increase FIST score by > 6 points to demonstrate decreased fall risk during functional activities Baseline: 37 5/13: 46/56 Goal status: MET  4.  Patient will complete 10 meter walk test to with HW in less than 1 minute with min assist as to improve gait speed for better community ambulation and to reduce fall risk. Baseline: 6 ft with HW and mod assist; 12/28/2023=18 feet with HW- mod assist  5/13: 2:11 min with HW, min-mod assist. 04/09/2024= Patient ambulated approx 22 feet using HW with Min assist and mod VC for gait sequencing-then experienced LOB requiring Mod Assist to maintain standing. 8/26: 2:28min (0 .28m/s) with HW and min fading to mod assist to advance the RLE with fatigue (165 sec); 06/06/2024= 0.85 m/s sec avg with HM with CGA and close w/c follow. 07/02/2024= 78 sec then 73 sec with HW= 0.14 m/s avg.  Goal status: PROGRESSING  5.  Patient will perform stand pivot transfer to and from [facility] with HW and CGA for safety to allow safe transfer to toilet with daughter without use of rail on the wall. Baseline: mod assist and heavy use of rail. 12/28/2023= Min to Mod assist with repetitive VC for technique 5/13: min assist overall  with HW; mod assist initially both directions  to prevent posterior LOB. 04/09/2024- Min assist physically in both directions yet increased VC for safety- if provided more time- patient  able to make correct movements 75% of time during transfers- still inconsistent with reaching back and pivoting on Right LE  8/26: mod assist from PT to the R and L on this day due to posterior LOB  07/02/2204- Min assist from w/c to standard chair with VC for hand placement- no posterior lean on both attempts today- min assist to stand and VC for sequencing.  Goal status: Progressing  6.  Patient will improve bed mobility to min assist only to reduce care giver burden and improve safety of transfers  Baseline: mod-max assist 5/13: Min-mod assist for sit<>supine. Min assist only for RLE management for rolling; 04/09/2024- unable to test due to time  8/26. Mod assist for sit<>supine movement at trunk and RLE and min assist for rolling tasks on this day.  Goal status: ONGOING  ASSESSMENT:  CLINICAL IMPRESSION:  Patient resumed some walking today with daughter present to observe. She was emotional after finishing her first walk. Slower than some previous visits with more VC yet on 2nd walk- much improved gait sequencing with less prompting and more confidence - smoother and faster (although not officially measured). Pt will benefit from continued skilled PT to address strength and balance deficits to improve safety for transfers, reduce care giver burden, and allow return to PLOF and possible return home from Assisted living.   OBJECTIVE IMPAIRMENTS: Abnormal gait, cardiopulmonary status limiting activity, decreased activity tolerance, decreased balance, decreased cognition, decreased coordination, decreased endurance, decreased knowledge of use of DME, decreased mobility, difficulty walking, decreased ROM, decreased strength, hypomobility, increased muscle spasms, impaired flexibility, impaired sensation, impaired tone,  impaired UE functional use, improper body mechanics, and postural dysfunction.   ACTIVITY LIMITATIONS: carrying, lifting, bending, standing, squatting, stairs, transfers, bed mobility, continence, bathing, toileting, dressing, reach over head, hygiene/grooming, and locomotion level  PARTICIPATION LIMITATIONS: cleaning, interpersonal relationship, shopping, and community activity  PERSONAL FACTORS: 3+ comorbidities: HTN, chronic CVA, Afib, Lupus  are also affecting patient's functional outcome.   REHAB POTENTIAL: Fair chronic CVA with poor progress in prior interventions with this clinic  CLINICAL DECISION MAKING: Unstable/unpredictable  EVALUATION COMPLEXITY: High  PLAN:  PT FREQUENCY: 1-2x/week  PT DURATION: 12 weeks  PLANNED INTERVENTIONS: 97110-Therapeutic exercises, 97530- Therapeutic activity, [id number]- Neuromuscular re-education, 97535- Self Care, [id number]- Manual therapy, [phone]- Gait training, [phone]- Orthotic Fit/training, [phone]- Aquatic Therapy, [phone]- Splinting, [id number]- Electrical stimulation (unattended), [phone]- Electrical stimulation (manual), Patient/Family education, Balance training, Stair training, Joint mobilization, Vestibular training, Visual/preceptual remediation/compensation, Cognitive remediation, DME instructions, Wheelchair mobility training, Cryotherapy, and Moist heat  PLAN FOR NEXT SESSION:   Continue with functional standing Continue with transfer safety Continue with gait training.   Note: Portions of this document were prepared using Dragon voice recognition software and although reviewed may contain unintentional dictation errors in syntax, grammar, or spelling.  [name] PT ,DPT Physical Therapist- [place]  [facility]   10:00 AM 07/30/24

## 2024-08-01 ENCOUNTER — Ambulatory Visit

## 2024-08-01 DIAGNOSIS — R482 Apraxia: Secondary | ICD-10-CM

## 2024-08-01 DIAGNOSIS — R278 Other lack of coordination: Secondary | ICD-10-CM

## 2024-08-01 DIAGNOSIS — M6281 Muscle weakness (generalized): Secondary | ICD-10-CM

## 2024-08-01 DIAGNOSIS — R269 Unspecified abnormalities of gait and mobility: Secondary | ICD-10-CM

## 2024-08-01 DIAGNOSIS — R262 Difficulty in walking, not elsewhere classified: Secondary | ICD-10-CM

## 2024-08-01 DIAGNOSIS — R41841 Cognitive communication deficit: Secondary | ICD-10-CM

## 2024-08-01 DIAGNOSIS — R2681 Unsteadiness on feet: Secondary | ICD-10-CM

## 2024-08-01 DIAGNOSIS — R2689 Other abnormalities of gait and mobility: Secondary | ICD-10-CM

## 2024-08-01 NOTE — Therapy (Signed)
 OUTPATIENT PHYSICAL THERAPY TREATMENT   Patient Name: Tyana Butzer MRN: [id number] DOB:03-30-1956, 68 y.o., female Today's Date: 08/02/2024  PCP: Lorel Maxie LABOR, MD  REFERRING PROVIDER: Lorilee Sven SQUIBB, MD   END OF SESSION:   PT End of Session - 08/01/24 1558     Visit Number 54    Number of Visits 72    Date for Recertification  09/24/24    Authorization Type Medicare/Medicaid    Progress Note Due on Visit 60    PT Start Time 1532    PT Stop Time 1613    PT Time Calculation (min) 41 min    Equipment Utilized During Treatment Gait belt    Activity Tolerance Patient tolerated treatment well;No increased pain;Patient limited by fatigue    Behavior During Therapy [facility] for tasks assessed/performed                      Past Medical History:  Diagnosis Date   Aphasia    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 No pertinent surgical history. 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   REFERRING DIAG:  Diagnosis  I69.398,R25.2 (ICD-10-CM) - Spasticity as late effect of cerebrovascular accident (CVA)    THERAPY DIAG:  Unsteadiness on feet  Difficulty in walking, not elsewhere classified  Abnormality of gait and mobility  Other abnormalities of gait and mobility  Muscle weakness (generalized)  Other lack of coordination  Apraxia  Cognitive communication deficit  Rationale for Evaluation and Treatment: Rehabilitation  SUBJECTIVE:  SUBJECTIVE STATEMENT: Patient reports some right hand tightness from tone- otherwise no complaints.   PERTINENT HISTORY:  Daughter present for Evaluation to provide hx. Pt is familiar to this clinic and has been seen for multiple bouts PT since initial CVA in 2021. Pt d/c'ed from PT serviced in April of 2024 with plans to receive PT services through [facility]. Daughter reports that she was only receiving minimal therapy in facility. Reports at least 1 fall in the last 6 months and daughter asked staff to transfer pt with lift since fall.  Daughter would want pt to demonstrate improve safety with transfers to allow bil transfers with reduced use of rail to allow pt to return home for assisted living.    PAIN:  Are you having pain? No  PRECAUTIONS: Fall  RED FLAGS: None   WEIGHT BEARING RESTRICTIONS: No  FALLS: Has patient fallen in last 6 months? Yes. Number of falls 1  LIVING ENVIRONMENT: Lives with: lives with their family and lives in a skilled nursing facility Lives in: Other SNF  Stairs: No Has following equipment at home: Hemi walker and Wheelchair (manual)  PLOF: Requires assistive device for independence, Needs  assistance with ADLs, Needs assistance with homemaking, and WC level currently   PATIENT GOALS: move better - walking or transferring with hemi walking   OBJECTIVE:  Note: Objective measures were completed at Evaluation unless otherwise noted.  DIAGNOSTIC FINDINGS:  Head CT:  IMPRESSION: 1. No acute intracranial abnormality. 2. No acute displaced fracture or traumatic listhesis of the cervical spine.  Cervical CT:  MPRESSION: 1. No acute intracranial abnormality. 2. No acute displaced fracture or traumatic listhesis of the cervical spine.  COGNITION: Overall cognitive status: History of cognitive impairments - at baseline   SENSATION: Light touch: Impaired  Proprioception: Impaired  Able to detect depe pressure   COORDINATION: Increased tone on the RLE   EDEMA:  Mild RLE distal edema   MUSCLE TONE: RLE: Mild and Moderate  MUSCLE LENGTH: Hamstrings: grossly limited   POSTURE: rounded shoulders, forward head, and posterior bias   LOWER EXTREMITY ROM:     Active  Right Eval Left Eval  Hip flexion  Limited to 5 deg beyond 90 in sitting  Knee flexion  90deg in sitting  Knee extension  Lacking 10 deg full extension  Ankle dorsiflexion  none  Ankle plantarflexion  none   (Blank rows = not tested)  LOWER EXTREMITY MMT:    MMT Right Eval Left Eval  Hip flexion  2+  Hip extension    Hip abduction  2  Hip adduction  3  Hip internal rotation    Hip external rotation    Knee flexion  2+  Knee extension  3+  Ankle dorsiflexion  0  Ankle plantarflexion  0  Ankle inversion    Ankle eversion    (Blank rows = not tested)  BED MOBILITY:  Sit to supine Mod A Supine to sit Mod A Rolling to Right Min A Rolling to Left Min A  TRANSFERS: Assistive device utilized: Hemi walker  Sit to stand: Mod A Stand to sit: Mod A Chair to chair: Mod A Floor: unable to perform   CURB:  Level of Assistance: Total A Assistive device utilized: rail Curb Comments: unable  to perform   GAIT: Gait pattern: non-functional constant posterior bias , step to pattern, decreased stance time- Right, Right hip hike, lateral hip instability, and lateral lean- Left Distance walked: 42ft Assistive device utilized: Hemi walker Level  of assistance: Mod A Comments: moderate cues for sequencing of HW and gait pattern in turns   FUNCTIONAL TESTS:  5 times sit to stand: 48 sec with mod Assist from PT  Timed up and go (TUG): unable to perform  10 meter walk test: 6 ft with min-mod assist and HW.  FIST:                                                                                                                               TREATMENT DATE: 08/02/2024  In // bars:  TA: STS 1 hand on rail  x 10 without  VC for hand placement for all reps today.  Static stand in // bars alternating between 1UE support vs. None x 5 min with VC to hold head up and equal feet width.  Progressed to dynamic weight shifting x 1 min L to R  Step over 1/2 foam roll (attempted leading with LLE yet more difficulty- did better stepping over with RLE x 8) Side stepping in // bars - down and back x 2    Gait:  Fwd/bwd walking in // bars x length of bars then retro back x 4 (VC for increased step length and patient fatigued at end of last session- min assist to swing   Pt required occasional rest breaks due fatigue, PT was attentive to when pt appeared to be tired or winded in order to prevent excessive fatigue.           PATIENT EDUCATION: Education details: .  Pt educated throughout session about proper posture and technique with exercises. Improved exercise technique, movement at target joints, use of target muscles after min to mod verbal, visual, tactile cues Interpretation of outcome measure; need for continued progress to justify continued PT services.   Person educated: Patient Education method: Explanation Education comprehension: verbalized understanding  HOME EXERCISE  PROGRAM: Access Code: [email or web address] URL: [email or web address] Date: 12/05/2023 Prepared by: Massie Dollar  Exercises - Sit to Stand with Counter Support  - 1 x daily - 3-4 x weekly - 2 sets - 10 reps - Seated Long Arc Quad  - 1 x daily - 3-4 x weekly - 2 sets - 10 reps - 2 hold - Seated March  - 1 x daily - 3-4 x weekly - 2 sets - 10 reps - 2 hold - Standing Hip Abduction with Counter Support  - 1 x daily - 7 x weekly - 3 sets - 5 reps - Wide Stance with Counter Support  - 1 x daily - 7 x weekly - 3 sets - 2 reps - 30 hold - Seated Hip Abduction  - 1 x daily - 7 x weekly - 3 sets - 10 reps - Supine Bridge  - 1 x daily - 7 x weekly - 3 sets - 10 reps - Supine Short Arc Quad  - 1 x daily - 7 x weekly - 3 sets - 10 reps -  Bent Knee Fallouts  - 1 x daily - 7 x weekly - 3 sets - 10 reps - Small Range Straight Leg Raise  - 1 x daily - 7 x weekly - 3 sets - 10 reps - Supine Heel Slide  - 1 x daily - 7 x weekly - 3 sets - 10 reps  GOALS: Goals reviewed with patient? Yes  SHORT TERM GOALS: Target date: 08/03/2024  Patient will be independent in home exercise program to improve strength/mobility for better functional independence with ADLs. Baseline: to be provided on visit 2: 12/28/2023- No caregiver with patient to determine if compliant with HEP Goal  5/13: daugher states that they are completing some exercises almost daily; 04/09/2024= No caregiver present today to f/u with compliance of HEP- Patient verbalized yes and stated standing, moving my legs. 07/02/2024= upon last conversation with patient dtr at last visit- she is going to talk to the lady who sometimes bring patient to clinic to see if she can do some standing and the HEP at the facility with patient.  status: IN PROGRESS  LONG TERM GOALS: Target date: 09/24/2024  2.  Patient (> 36 years old) will complete five times sit to stand test by 15 seconds and only min assist indicating an increased LE strength and improved  balance. Baseline: 48 with mod assist from PT; 12/28/2023= 44 sec from EOM at lowest postion (approx 21 in) with Left UE support and CGA to min A from PT 5/13: 28.80 sec with HW from [facility]; min assist from PT for full erect standing; 04/09/2024= 26.56 sec with HW in front- pushing up using LUE on armrest of w/c- Min Assist for full erect standing 8/26: 25.41 sec with UE pushing from arm rest and reaching for rail on wall; min a to mod assist from PT throughout all repetitions for adequate anterior weight shift; 07/02/2024= 21.98 sec with min physical assist Goal status: PROGRESSING  3.  Patient will increase FIST score by > 6 points to demonstrate decreased fall risk during functional activities Baseline: 37 5/13: 46/56 Goal status: MET  4.  Patient will complete 10 meter walk test to with HW in less than 1 minute with min assist as to improve gait speed for better community ambulation and to reduce fall risk. Baseline: 6 ft with HW and mod assist; 12/28/2023=18 feet with HW- mod assist  5/13: 2:11 min with HW, min-mod assist. 04/09/2024= Patient ambulated approx 22 feet using HW with Min assist and mod VC for gait sequencing-then experienced LOB requiring Mod Assist to maintain standing. 8/26: 2:17min (0 .65m/s) with HW and min fading to mod assist to advance the RLE with fatigue (165 sec); 06/06/2024= 0.85 m/s sec avg with HM with CGA and close w/c follow. 07/02/2024= 78 sec then 73 sec with HW= 0.14 m/s avg.  Goal status: PROGRESSING  5.  Patient will perform stand pivot transfer to and from [facility] with HW and CGA for safety to allow safe transfer to toilet with daughter without use of rail on the wall. Baseline: mod assist and heavy use of rail. 12/28/2023= Min to Mod assist with repetitive VC for technique 5/13: min assist overall with HW; mod assist initially both directions  to prevent posterior LOB. 04/09/2024- Min assist physically in both directions yet increased VC for safety- if provided more time-  patient able to make correct movements 75% of time during transfers- still inconsistent with reaching back and pivoting on Right LE  8/26: mod assist from PT to the  R and L on this day due to posterior LOB  07/02/2204- Min assist from w/c to standard chair with VC for hand placement- no posterior lean on both attempts today- min assist to stand and VC for sequencing.  Goal status: Progressing  6.  Patient will improve bed mobility to min assist only to reduce care giver burden and improve safety of transfers  Baseline: mod-max assist 5/13: Min-mod assist for sit<>supine. Min assist only for RLE management for rolling; 04/09/2024- unable to test due to time  8/26. Mod assist for sit<>supine movement at trunk and RLE and min assist for rolling tasks on this day.  Goal status: ONGOING  ASSESSMENT:  CLINICAL IMPRESSION:  Patient presented with improved transfer ability - no cues for hand/feet placement with standing and able to walk back to chair and reach back without VC at 100% today. She struggled with step over activity due to extensor tone and did better with leading with RLE. She was pleased to be able to step over the 1/2 foam several times and fatigued at end of session today. Will attempt litegait activities next week to try to advance her RLE better in unloaded capacity.  Pt will benefit from continued skilled PT to address strength and balance deficits to improve safety for transfers, reduce care giver burden, and allow return to PLOF and possible return home from Assisted living.   OBJECTIVE IMPAIRMENTS: Abnormal gait, cardiopulmonary status limiting activity, decreased activity tolerance, decreased balance, decreased cognition, decreased coordination, decreased endurance, decreased knowledge of use of DME, decreased mobility, difficulty walking, decreased ROM, decreased strength, hypomobility, increased muscle spasms, impaired flexibility, impaired sensation, impaired tone, impaired UE  functional use, improper body mechanics, and postural dysfunction.   ACTIVITY LIMITATIONS: carrying, lifting, bending, standing, squatting, stairs, transfers, bed mobility, continence, bathing, toileting, dressing, reach over head, hygiene/grooming, and locomotion level  PARTICIPATION LIMITATIONS: cleaning, interpersonal relationship, shopping, and community activity  PERSONAL FACTORS: 3+ comorbidities: HTN, chronic CVA, Afib, Lupus  are also affecting patient's functional outcome.   REHAB POTENTIAL: Fair chronic CVA with poor progress in prior interventions with this clinic  CLINICAL DECISION MAKING: Unstable/unpredictable  EVALUATION COMPLEXITY: High  PLAN:  PT FREQUENCY: 1-2x/week  PT DURATION: 12 weeks  PLANNED INTERVENTIONS: 97110-Therapeutic exercises, 97530- Therapeutic activity, [id number]- Neuromuscular re-education, 97535- Self Care, [id number]- Manual therapy, [phone]- Gait training, [phone]- Orthotic Fit/training, [phone]- Aquatic Therapy, [phone]- Splinting, [id number]- Electrical stimulation (unattended), [phone]- Electrical stimulation (manual), Patient/Family education, Balance training, Stair training, Joint mobilization, Vestibular training, Visual/preceptual remediation/compensation, Cognitive remediation, DME instructions, Wheelchair mobility training, Cryotherapy, and Moist heat  PLAN FOR NEXT SESSION:   Continue with functional standing Continue with transfer safety Continue with gait training.     [name] PT  Physical Therapist- [place]  [facility]   9:00 AM 08/02/24

## 2024-08-06 ENCOUNTER — Ambulatory Visit: Attending: Physical Medicine and Rehabilitation

## 2024-08-06 DIAGNOSIS — R262 Difficulty in walking, not elsewhere classified: Secondary | ICD-10-CM | POA: Diagnosis present

## 2024-08-06 DIAGNOSIS — I63542 Cerebral infarction due to unspecified occlusion or stenosis of left cerebellar artery: Secondary | ICD-10-CM | POA: Diagnosis present

## 2024-08-06 DIAGNOSIS — R41841 Cognitive communication deficit: Secondary | ICD-10-CM | POA: Insufficient documentation

## 2024-08-06 DIAGNOSIS — R278 Other lack of coordination: Secondary | ICD-10-CM | POA: Insufficient documentation

## 2024-08-06 DIAGNOSIS — R482 Apraxia: Secondary | ICD-10-CM | POA: Insufficient documentation

## 2024-08-06 DIAGNOSIS — M6281 Muscle weakness (generalized): Secondary | ICD-10-CM | POA: Diagnosis present

## 2024-08-06 DIAGNOSIS — R2681 Unsteadiness on feet: Secondary | ICD-10-CM | POA: Diagnosis present

## 2024-08-06 DIAGNOSIS — R269 Unspecified abnormalities of gait and mobility: Secondary | ICD-10-CM | POA: Diagnosis present

## 2024-08-06 DIAGNOSIS — R2689 Other abnormalities of gait and mobility: Secondary | ICD-10-CM | POA: Insufficient documentation

## 2024-08-06 NOTE — Therapy (Signed)
 OUTPATIENT PHYSICAL THERAPY TREATMENT   Patient Name: Lindsey [name] MRN: [id number] DOB:[birth year], 68 y.o., female Today's Date: 08/06/2024  PCP: Lorel Maxie LABOR, MD  REFERRING PROVIDER: Lorilee Sven SQUIBB, MD   END OF SESSION:   PT End of Session - 08/06/24 0945     Visit Number 55    Number of Visits 72    Date for Recertification  09/24/24    Authorization Type Medicare/Medicaid    Progress Note Due on Visit 60    PT Start Time 0852    PT Stop Time 0926    PT Time Calculation (min) 34 min    Equipment Utilized During Treatment Gait belt    Activity Tolerance Patient tolerated treatment well;No increased pain;Patient limited by fatigue    Behavior During Therapy [facility] for tasks assessed/performed                      Past Medical History:  Diagnosis Date   Aphasia    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 No pertinent surgical history. 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 Dtr present for today's session    PERTINENT HISTORY:  Daughter present for Evaluation to provide hx. Pt is familiar to this clinic and has been seen for multiple bouts PT since initial CVA in 2021. Pt d/c'ed from PT serviced in April of 2024 with plans to receive PT services through [facility]. Daughter reports that she was only receiving minimal therapy in facility. Reports at least 1 fall in the last 6 months and daughter asked staff to transfer pt with lift since fall.  Daughter would want pt to demonstrate improve safety with transfers to allow bil transfers with reduced use of rail to allow pt to return home for assisted living.    PAIN:  Are you having pain? No  PRECAUTIONS: Fall  RED FLAGS: None   WEIGHT BEARING RESTRICTIONS: No  FALLS: Has patient fallen in last 6 months? Yes. Number of falls 1  LIVING ENVIRONMENT: Lives with: lives with their family and lives in a skilled nursing facility Lives in: Other SNF  Stairs: No Has  following equipment at home: Hemi walker and Wheelchair (manual)  PLOF: Requires assistive device for independence, Needs assistance with ADLs, Needs assistance with homemaking, and WC level currently   PATIENT GOALS: move better - walking or transferring with hemi walking   OBJECTIVE:  Note: Objective measures were completed at Evaluation unless otherwise noted.  DIAGNOSTIC FINDINGS:  Head CT:  IMPRESSION: 1. No acute intracranial abnormality. 2. No acute displaced fracture or traumatic listhesis of the cervical spine.  Cervical CT:  MPRESSION: 1. No acute intracranial abnormality. 2. No acute displaced fracture or traumatic listhesis of the cervical spine.  COGNITION: Overall cognitive status: History of cognitive impairments - at baseline   SENSATION: Light touch: Impaired  Proprioception: Impaired  Able to detect depe pressure   COORDINATION: Increased tone on the RLE   EDEMA:  Mild RLE distal edema   MUSCLE TONE: RLE: Mild and Moderate  MUSCLE LENGTH: Hamstrings: grossly limited   POSTURE: rounded shoulders, forward head, and posterior bias   LOWER EXTREMITY ROM:     Active  Right Eval Left Eval  Hip flexion  Limited to 5 deg beyond 90 in sitting  Knee flexion  90deg in sitting  Knee extension  Lacking 10 deg full extension  Ankle dorsiflexion  none  Ankle plantarflexion  none   (Blank rows = not tested)  LOWER EXTREMITY MMT:    MMT Right Eval Left Eval  Hip flexion  2+  Hip extension    Hip abduction  2  Hip adduction  3  Hip internal rotation    Hip external rotation    Knee flexion  2+  Knee extension  3+  Ankle dorsiflexion  0  Ankle plantarflexion  0  Ankle inversion    Ankle eversion    (Blank rows = not tested)  BED MOBILITY:  Sit to supine Mod A Supine to sit Mod A Rolling to Right Min A Rolling to Left Min A  TRANSFERS: Assistive device utilized: Hemi walker  Sit to stand: Mod A Stand to sit: Mod A Chair to chair: Mod  A Floor: unable to perform   CURB:  Level of Assistance: Total A Assistive device utilized: rail Curb Comments: unable to perform   GAIT: Gait pattern: non-functional constant posterior bias , step to pattern, decreased stance time- Right, Right hip hike, lateral hip instability, and lateral lean- Left  Distance walked: 31ft Assistive device utilized: Hemi walker Level of assistance: Mod A Comments: moderate cues for sequencing of HW and gait pattern in turns   FUNCTIONAL TESTS:  5 times sit to stand: 48 sec with mod Assist from PT  Timed up and go (TUG): unable to perform  10 meter walk test: 6 ft with min-mod assist and HW.  FIST:                                                                                                                               TREATMENT DATE: 08/06/2024    Lite gait set up including standing patient to don adult pad/harness- increased time as this was patient's first time and she was able to stand throughout process using hemiwalker for balance and 2 person assist.  Once harness locked in- assisted patient walking forward to edge of TM- Max assist to lift RLE up onto TM (difficulty due to extensor tone) but cleared foot onto step and she was then able to step up with left LE holding onto handle of lite gait.   Gait training using Lite gait- speed ranged from 0.4-0.8 x multiple attempts- max assist today with advancing/coordinating RLE steps- on last attempt- able to improve sequencing with Left LE using the gait assist straps.         PATIENT EDUCATION: Education details: .  Pt educated throughout session about proper posture and technique with exercises. Improved exercise technique, movement at target joints, use of target muscles after min to mod verbal, visual, tactile cues Interpretation of outcome measure; need for continued progress to justify continued PT services.   Person educated: Patient Education method: Explanation Education  comprehension: verbalized understanding  HOME EXERCISE PROGRAM: Access Code: [email or web address] URL: [email or web address] Date: 12/05/2023 Prepared by: Massie Dollar  Exercises - Sit to Stand with Counter Support  - 1 x daily - 3-4 x weekly - 2 sets - 10 reps - Seated Long Arc Quad  - 1 x daily - 3-4 x weekly - 2 sets - 10 reps - 2 hold - Seated March  - 1 x daily - 3-4 x weekly - 2 sets - 10 reps - 2 hold - Standing Hip Abduction with Counter Support  - 1 x daily - 7 x weekly - 3 sets - 5 reps - Wide Stance with Counter Support  - 1 x daily - 7 x weekly - 3 sets - 2 reps - 30 hold - Seated Hip Abduction  - 1 x daily - 7 x weekly - 3 sets - 10 reps - Supine Bridge  - 1 x daily - 7 x weekly - 3 sets - 10 reps - Supine Short Arc Quad  - 1 x daily - 7 x weekly - 3 sets - 10 reps - Bent Knee Fallouts  - 1 x daily - 7 x weekly - 3 sets - 10 reps - Small Range Straight Leg Raise  - 1  x daily - 7 x weekly - 3 sets - 10 reps - Supine Heel Slide  - 1 x daily - 7 x weekly - 3 sets - 10 reps  GOALS: Goals reviewed with patient? Yes  SHORT TERM GOALS: Target date: 08/03/2024  Patient will be independent in home exercise program to improve strength/mobility for better functional independence with ADLs. Baseline: to be provided on visit 2: 12/28/2023- No caregiver with patient to determine if compliant with HEP Goal  5/13: daugher states that they are completing some exercises almost daily; 04/09/2024= No caregiver present today to f/u with compliance of HEP- Patient verbalized yes and stated standing, moving my legs. 07/02/2024= upon last conversation with patient dtr at last visit- she is going to talk to the lady who sometimes bring patient to clinic to see if she can do some standing and the HEP at the facility with patient.  status: IN PROGRESS  LONG TERM GOALS: Target date: 09/24/2024  2.  Patient (> 80 years old) will complete five times sit to stand test by 15 seconds and only min assist  indicating an increased LE strength and improved balance. Baseline: 48 with mod assist from PT; 12/28/2023= 44 sec from EOM at lowest postion (approx 21 in) with Left UE support and CGA to min A from PT 5/13: 28.80 sec with HW from [employer]; min assist from PT for full erect standing; 04/09/2024= 26.56 sec with HW in front- pushing up using LUE on armrest of w/c- Min Assist for full erect standing 8/26: 25.41 sec with UE pushing from arm rest and reaching for rail on wall; min a to mod assist from PT throughout all repetitions for adequate anterior weight shift; 07/02/2024= 21.98 sec with min physical assist Goal status: PROGRESSING  3.  Patient will increase FIST score by > 6 points to demonstrate decreased fall risk during functional activities Baseline: 37 5/13: 46/56 Goal status: MET  4.  Patient will complete 10 meter walk test to with HW in less than 1 minute with min assist as to improve gait speed for better community ambulation and to reduce fall risk. Baseline: 6 ft with HW and mod assist; 12/28/2023=18 feet with HW- mod assist  5/13: 2:11 min with HW, min-mod assist. 04/09/2024= Patient ambulated approx 22 feet using HW with Min assist and mod VC for gait sequencing-then experienced LOB requiring Mod Assist to maintain standing. 8/26: 2:33min (0 .4m/s) with HW and min fading to mod assist to advance the RLE with fatigue (165 sec); 06/06/2024= 0.85 m/s sec avg with HM with CGA and close w/c follow. 07/02/2024= 78 sec then 73 sec with HW= 0.14 m/s avg.  Goal status: PROGRESSING  5.  Patient will perform stand pivot transfer to and from [facility] with HW and CGA for safety to allow safe transfer to toilet with daughter without use of rail on the wall. Baseline: mod assist and heavy use of rail. 12/28/2023= Min to Mod assist with repetitive VC for technique 5/13: min assist overall with HW; mod assist initially both directions  to prevent posterior LOB. 04/09/2024- Min assist physically in both directions yet  increased VC for safety- if provided more time- patient able to make correct movements 75% of time during transfers- still inconsistent with reaching back and pivoting on Right LE  8/26: mod assist from PT to the R and L on this day due to posterior LOB  07/02/2204- Min assist from w/c to standard chair with VC for hand placement- no posterior lean  on both attempts today- min assist to stand and VC for sequencing.  Goal status: Progressing  6.  Patient will improve bed mobility to min assist only to reduce care giver burden and improve safety of transfers  Baseline: mod-max assist 5/13: Min-mod assist for sit<>supine. Min assist only for RLE management for rolling; 04/09/2024- unable to test due to time  8/26. Mod assist for sit<>supine movement at trunk and RLE and min assist for rolling tasks on this day.  Goal status: ONGOING  ASSESSMENT:  CLINICAL IMPRESSION:  Patient was able to perform some walking on treadmill using un-weighing system. Increased time for set up and 1st couple of initially walks with increased difficulty advancing LE's. Required 3 people to assist plus use of gait assist straps at shoes but was able to take more reciprocal steps with practice. Still very limited active ability to clear rIght LE with no knee flex. Pt will benefit from continued skilled PT to address strength and balance deficits to improve safety for transfers, reduce care giver burden, and allow return to PLOF and possible return home from Assisted living.   OBJECTIVE IMPAIRMENTS: Abnormal gait, cardiopulmonary status limiting activity, decreased activity tolerance, decreased balance, decreased cognition, decreased coordination, decreased endurance, decreased knowledge of use of DME, decreased mobility, difficulty walking, decreased ROM, decreased strength, hypomobility, increased muscle spasms, impaired flexibility, impaired sensation, impaired tone, impaired UE functional use, improper body mechanics, and  postural dysfunction.   ACTIVITY LIMITATIONS: carrying, lifting, bending, standing, squatting, stairs, transfers, bed mobility, continence, bathing, toileting, dressing, reach over head, hygiene/grooming, and locomotion level  PARTICIPATION LIMITATIONS: cleaning, interpersonal relationship, shopping, and community activity  PERSONAL FACTORS: 3+ comorbidities: HTN, chronic CVA, Afib, Lupus  are also affecting patient's functional outcome.   REHAB POTENTIAL: Fair chronic CVA with poor progress in prior interventions with this clinic  CLINICAL DECISION MAKING: Unstable/unpredictable  EVALUATION COMPLEXITY: High  PLAN:  PT FREQUENCY: 1-2x/week  PT DURATION: 12 weeks  PLANNED INTERVENTIONS: 97110-Therapeutic exercises, 97530- Therapeutic activity, [id number]- Neuromuscular re-education, 97535- Self Care, [id number]- Manual therapy, [phone]- Gait training, [phone]- Orthotic Fit/training, [phone]- Aquatic Therapy, [phone]- Splinting, [id number]- Electrical stimulation (unattended), [phone]- Electrical stimulation (manual), Patient/Family education, Balance training, Stair training, Joint mobilization, Vestibular training, Visual/preceptual remediation/compensation, Cognitive remediation, DME instructions, Wheelchair mobility training, Cryotherapy, and Moist heat  PLAN FOR NEXT SESSION:  Continue with Lite Gait Continue with functional standing Continue with transfer safety Continue with gait training.     [name] PT  Physical Therapist- [facility]   10:18 AM 08/06/24

## 2024-08-08 ENCOUNTER — Ambulatory Visit

## 2024-08-13 ENCOUNTER — Ambulatory Visit

## 2024-08-13 NOTE — Therapy (Incomplete)
 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   REFERRING DIAG:  Diagnosis  I69.398,R25.2 (ICD-10-CM) - Spasticity as late effect of cerebrovascular accident (CVA)    THERAPY DIAG:  No diagnosis found.  Rationale for Evaluation and Treatment: Rehabilitation  SUBJECTIVE:                                                                                                                                                                                              SUBJECTIVE STATEMENT: Patient reports doing well and agreeable to try the lite gait on treadmill today. Dtr present for today's session    PERTINENT HISTORY:  Daughter present for Evaluation to provide hx. Pt is familiar to this clinic and has been seen for multiple bouts PT since initial CVA in 2021. Pt d/c'ed from PT serviced in April of 2024 with plans to receive PT services through [facility]. Daughter reports that she was only receiving minimal therapy in facility. Reports at least 1 fall in the last 6 months and daughter asked staff to transfer pt with  lift since fall.  Daughter would want pt to demonstrate improve safety with transfers to allow bil transfers with reduced use of rail to allow pt to return home for assisted living.    PAIN:  Are you having pain? No  PRECAUTIONS: Fall  RED FLAGS: None   WEIGHT BEARING RESTRICTIONS: No  FALLS: Has patient fallen in last 6 months? Yes. Number of falls 1  LIVING ENVIRONMENT: Lives with: lives with their family and lives in a skilled nursing facility Lives in: Other SNF  Stairs: No Has following equipment at home: Hemi walker and Wheelchair (manual)  PLOF: Requires assistive device for independence, Needs assistance with ADLs, Needs assistance with homemaking, and WC level currently   PATIENT GOALS: move better - walking or transferring with hemi walking   OBJECTIVE:  Note: Objective measures were completed at Evaluation unless otherwise noted.  DIAGNOSTIC FINDINGS:  Head CT:  IMPRESSION: 1. No acute intracranial abnormality. 2. No acute displaced fracture or traumatic listhesis of the cervical spine.  Cervical CT:  MPRESSION: 1. No acute intracranial abnormality. 2. No acute displaced fracture or traumatic listhesis of the cervical spine.  COGNITION: Overall cognitive status: History of cognitive impairments - at baseline   SENSATION: Light touch: Impaired  Proprioception: Impaired  Able to  detect depe pressure   COORDINATION: Increased tone on the RLE   EDEMA:  Mild RLE distal edema   MUSCLE TONE: RLE: Mild and Moderate  MUSCLE LENGTH: Hamstrings: grossly limited   POSTURE: rounded shoulders, forward head, and posterior bias   LOWER EXTREMITY ROM:     Active  Right Eval Left Eval  Hip flexion  Limited to 5 deg beyond 90 in sitting  Knee flexion  90deg in sitting  Knee extension  Lacking 10 deg full extension  Ankle dorsiflexion  none  Ankle plantarflexion  none   (Blank rows = not tested)  LOWER EXTREMITY MMT:    MMT Right Eval Left Eval  Hip flexion  2+  Hip extension    Hip abduction  2  Hip adduction  3  Hip internal rotation    Hip external rotation    Knee flexion  2+  Knee extension  3+  Ankle dorsiflexion  0  Ankle plantarflexion  0  Ankle inversion    Ankle eversion    (Blank rows = not tested)  BED MOBILITY:  Sit to supine Mod A Supine to sit Mod A Rolling to Right Min A Rolling to Left Min A  TRANSFERS: Assistive device utilized: Hemi walker  Sit to stand: Mod A Stand to sit: Mod A Chair to chair: Mod A Floor: unable to perform   CURB:  Level of Assistance: Total A Assistive device utilized: rail Curb Comments: unable to perform   GAIT: Gait pattern: non-functional constant posterior bias , step to pattern, decreased stance time- Right, Right hip hike, lateral hip instability, and lateral lean- Left Distance walked: 48ft Assistive device utilized: Hemi walker Level of assistance: Mod A Comments: moderate cues for sequencing of HW and gait pattern in turns   FUNCTIONAL TESTS:  5 times sit to stand: 48 sec with mod Assist from PT  Timed up and go (TUG): unable to perform  10 meter walk test: 6 ft with min-mod assist and HW.  FIST:  TREATMENT DATE: 08/13/2024    Lite gait set up including  standing patient to don adult pad/harness- increased time as this was patient's first time and she was able to stand throughout process using hemiwalker for balance and 2 person assist.  Once harness locked in- assisted patient walking forward to edge of TM- Max assist to lift RLE up onto TM (difficulty due to extensor tone) but cleared foot onto step and she was then able to step up with left LE holding onto handle of lite gait.   Gait training using Lite gait- speed ranged from 0.4-0.8 x multiple attempts- max assist today with advancing/coordinating RLE steps- on last attempt- able to improve sequencing with Left LE using the gait assist straps.         PATIENT EDUCATION: Education details: .  Pt educated throughout session about proper posture and technique with exercises. Improved exercise technique, movement at target joints, use of target muscles after min to mod verbal, visual, tactile cues Interpretation of outcome measure; need for continued progress to justify continued PT services.   Person educated: Patient Education method: Explanation Education comprehension: verbalized understanding  HOME EXERCISE PROGRAM: Access Code: [email or web address] URL: [email or web address] Date: 12/05/2023 Prepared by: Massie Dollar  Exercises - Sit to Stand with Counter Support  - 1 x daily - 3-4 x weekly - 2 sets - 10 reps - Seated Long Arc Quad  - 1 x daily - 3-4 x weekly - 2 sets - 10 reps - 2 hold - Seated March  - 1 x daily - 3-4 x weekly - 2 sets - 10 reps - 2 hold - Standing Hip Abduction with Counter Support  - 1 x daily - 7 x weekly - 3 sets - 5 reps - Wide Stance with Counter Support  - 1 x daily - 7 x weekly - 3 sets - 2 reps - 30 hold - Seated Hip Abduction  - 1 x daily - 7 x weekly - 3 sets - 10 reps - Supine Bridge  - 1 x daily - 7 x weekly - 3 sets - 10 reps - Supine Short Arc Quad  - 1 x daily - 7 x weekly - 3 sets - 10 reps - Bent Knee Fallouts  - 1 x daily - 7 x weekly  - 3 sets - 10 reps - Small Range Straight Leg Raise  - 1 x daily - 7 x weekly - 3 sets - 10 reps - Supine Heel Slide  - 1 x daily - 7 x weekly - 3 sets - 10 reps  GOALS: Goals reviewed with patient? Yes  SHORT TERM GOALS: Target date: 08/03/2024  Patient will be independent in home exercise program to improve strength/mobility for better functional independence with ADLs. Baseline: to be provided on visit 2: 12/28/2023- No caregiver with patient to determine if compliant with HEP Goal  5/13: daugher states that they are completing some exercises almost daily; 04/09/2024= No caregiver present today to f/u with compliance of HEP- Patient verbalized yes and stated standing, moving my legs. 07/02/2024= upon last conversation with patient dtr at last visit- she is going to talk to the lady who sometimes bring patient to clinic to see if she can do some standing and the HEP at the facility with patient.  status: IN PROGRESS  LONG TERM GOALS: Target date: 09/24/2024  2.  Patient (> 34 years old) will complete five times sit to stand test by 15 seconds and only min assist indicating an  increased LE strength and improved balance. Baseline: 48 with mod assist from PT; 12/28/2023= 44 sec from EOM at lowest postion (approx 21 in) with Left UE support and CGA to min A from PT 5/13: 28.80 sec with HW from [facility]; min assist from PT for full erect standing; 04/09/2024= 26.56 sec with HW in front- pushing up using LUE on armrest of w/c- Min Assist for full erect standing 8/26: 25.41 sec with UE pushing from arm rest and reaching for rail on wall; min a to mod assist from PT throughout all repetitions for adequate anterior weight shift; 07/02/2024= 21.98 sec with min physical assist Goal status: PROGRESSING  3.  Patient will increase FIST score by > 6 points to demonstrate decreased fall risk during functional activities Baseline: 37 5/13: 46/56 Goal status: MET  4.  Patient will complete 10 meter walk test to with HW  in less than 1 minute with min assist as to improve gait speed for better community ambulation and to reduce fall risk. Baseline: 6 ft with HW and mod assist; 12/28/2023=18 feet with HW- mod assist  5/13: 2:11 min with HW, min-mod assist. 04/09/2024= Patient ambulated approx 22 feet using HW with Min assist and mod VC for gait sequencing-then experienced LOB requiring Mod Assist to maintain standing. 8/26: 2:5min (0 .54m/s) with HW and min fading to mod assist to advance the RLE with fatigue (165 sec); 06/06/2024= 0.85 m/s sec avg with HM with CGA and close w/c follow. 07/02/2024= 78 sec then 73 sec with HW= 0.14 m/s avg.  Goal status: PROGRESSING  5.  Patient will perform stand pivot transfer to and from [facility] with HW and CGA for safety to allow safe transfer to toilet with daughter without use of rail on the wall. Baseline: mod assist and heavy use of rail. 12/28/2023= Min to Mod assist with repetitive VC for technique 5/13: min assist overall with HW; mod assist initially both directions  to prevent posterior LOB. 04/09/2024- Min assist physically in both directions yet increased VC for safety- if provided more time- patient able to make correct movements 75% of time during transfers- still inconsistent with reaching back and pivoting on Right LE  8/26: mod assist from PT to the R and L on this day due to posterior LOB  07/02/2204- Min assist from w/c to standard chair with VC for hand placement- no posterior lean on both attempts today- min assist to stand and VC for sequencing.  Goal status: Progressing  6.  Patient will improve bed mobility to min assist only to reduce care giver burden and improve safety of transfers  Baseline: mod-max assist 5/13: Min-mod assist for sit<>supine. Min assist only for RLE management for rolling; 04/09/2024- unable to test due to time  8/26. Mod assist for sit<>supine movement at trunk and RLE and min assist for rolling tasks on this day.  Goal status:  ONGOING  ASSESSMENT:  CLINICAL IMPRESSION:  Patient was able to perform some walking on treadmill using un-weighing system. Increased time for set up and 1st couple of initially walks with increased difficulty advancing LE's. Required 3 people to assist plus use of gait assist straps at shoes but was able to take more reciprocal steps with practice. Still very limited active ability to clear rIght LE with no knee flex. Pt will benefit from continued skilled PT to address strength and balance deficits to improve safety for transfers, reduce care giver burden, and allow return to PLOF and possible return home from Assisted living.  OBJECTIVE IMPAIRMENTS: Abnormal gait, cardiopulmonary status limiting activity, decreased activity tolerance, decreased balance, decreased cognition, decreased coordination, decreased endurance, decreased knowledge of use of DME, decreased mobility, difficulty walking, decreased ROM, decreased strength, hypomobility, increased muscle spasms, impaired flexibility, impaired sensation, impaired tone, impaired UE functional use, improper body mechanics, and postural dysfunction.   ACTIVITY LIMITATIONS: carrying, lifting, bending, standing, squatting, stairs, transfers, bed mobility, continence, bathing, toileting, dressing, reach over head, hygiene/grooming, and locomotion level  PARTICIPATION LIMITATIONS: cleaning, interpersonal relationship, shopping, and community activity  PERSONAL FACTORS: 3+ comorbidities: HTN, chronic CVA, Afib, Lupus  are also affecting patient's functional outcome.   REHAB POTENTIAL: Fair chronic CVA with poor progress in prior interventions with this clinic  CLINICAL DECISION MAKING: Unstable/unpredictable  EVALUATION COMPLEXITY: High  PLAN:  PT FREQUENCY: 1-2x/week  PT DURATION: 12 weeks  PLANNED INTERVENTIONS: 97110-Therapeutic exercises, 97530- Therapeutic activity, [id number]- Neuromuscular re-education, 97535- Self Care, [id number]- Manual  therapy, [phone]- Gait training, [phone]- Orthotic Fit/training, [phone]- Aquatic Therapy, [phone]- Splinting, [id number]- Electrical stimulation (unattended), [phone]- Electrical stimulation (manual), Patient/Family education, Balance training, Stair training, Joint mobilization, Vestibular training, Visual/preceptual remediation/compensation, Cognitive remediation, DME instructions, Wheelchair mobility training, Cryotherapy, and Moist heat  PLAN FOR NEXT SESSION:  Continue with Lite Gait Continue with functional standing Continue with transfer safety Continue with gait training.     [name] PT  Physical Therapist- [facility]   8:51 AM 08/13/24

## 2024-08-15 ENCOUNTER — Ambulatory Visit

## 2024-08-20 ENCOUNTER — Ambulatory Visit

## 2024-08-20 DIAGNOSIS — R262 Difficulty in walking, not elsewhere classified: Secondary | ICD-10-CM

## 2024-08-20 DIAGNOSIS — R41841 Cognitive communication deficit: Secondary | ICD-10-CM

## 2024-08-20 DIAGNOSIS — R2689 Other abnormalities of gait and mobility: Secondary | ICD-10-CM

## 2024-08-20 DIAGNOSIS — R2681 Unsteadiness on feet: Secondary | ICD-10-CM | POA: Diagnosis not present

## 2024-08-20 DIAGNOSIS — M6281 Muscle weakness (generalized): Secondary | ICD-10-CM

## 2024-08-20 DIAGNOSIS — R269 Unspecified abnormalities of gait and mobility: Secondary | ICD-10-CM

## 2024-08-20 DIAGNOSIS — R278 Other lack of coordination: Secondary | ICD-10-CM

## 2024-08-20 DIAGNOSIS — R482 Apraxia: Secondary | ICD-10-CM

## 2024-08-20 NOTE — Therapy (Signed)
 OUTPATIENT PHYSICAL THERAPY TREATMENT   Patient Name: Lindsey [name] MRN: [id number] DOB:December 24, 1955, 68 y.o., female Today's Date: 08/20/2024  PCP: Lorel Maxie LABOR, MD  REFERRING PROVIDER: Lorilee Sven SQUIBB, MD   END OF SESSION:   PT End of Session - 08/20/24 0859     Visit Number 56    Number of Visits 72    Date for Recertification  09/24/24    Authorization Type Medicare/Medicaid    Progress Note Due on Visit 60    PT Start Time 0857    PT Stop Time 0930    PT Time Calculation (min) 33 min    Equipment Utilized During Treatment Gait belt    Activity Tolerance Patient tolerated treatment well;No increased pain;Patient limited by fatigue    Behavior During Therapy [facility] for tasks assessed/performed                       Past Medical History:  Diagnosis Date   Aphasia    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 No pertinent surgical history. 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   REFERRING DIAG:  Diagnosis  I69.398,R25.2 (ICD-10-CM) - Spasticity as late effect of cerebrovascular accident (CVA)    THERAPY DIAG:  Unsteadiness on feet  Difficulty in walking, not elsewhere classified  Abnormality of gait and mobility  Other abnormalities of gait and mobility  Muscle weakness (generalized)  Other lack of coordination  Apraxia  Cognitive communication deficit  Rationale for Evaluation and Treatment: Rehabilitation  SUBJECTIVE:  SUBJECTIVE STATEMENT: Patient reports doing okay today.     PERTINENT HISTORY:  Daughter present for Evaluation to provide hx. Pt is familiar to this clinic and has been seen for multiple bouts PT since initial CVA in 2021. Pt d/c'ed from PT serviced in April of 2024 with plans to receive PT services through [facility]. Daughter reports that she was only receiving minimal therapy in facility. Reports at least 1 fall in the last 6 months and daughter asked staff to transfer pt with lift since fall.  Daughter would want pt to demonstrate improve safety with transfers to allow bil transfers with reduced use of rail to allow pt to return home for assisted living.    PAIN:  Are you having pain? No  PRECAUTIONS: Fall  RED FLAGS: None   WEIGHT BEARING RESTRICTIONS: No  FALLS: Has patient fallen in last 6 months? Yes. Number of falls 1  LIVING ENVIRONMENT: Lives with: lives with their family and lives in a skilled nursing facility Lives in: Other SNF  Stairs: No Has following equipment at home: Hemi walker and Wheelchair (manual)  PLOF: Requires assistive device for independence, Needs assistance with ADLs, Needs assistance with  homemaking, and WC level currently   PATIENT GOALS: move better - walking or transferring with hemi walking   OBJECTIVE:  Note: Objective measures were completed at Evaluation unless otherwise noted.  DIAGNOSTIC FINDINGS:  Head CT:  IMPRESSION: 1. No acute intracranial abnormality. 2. No acute displaced fracture or traumatic listhesis of the cervical spine.  Cervical CT:  MPRESSION: 1. No acute intracranial abnormality. 2. No acute displaced fracture or traumatic listhesis of the cervical spine.  COGNITION: Overall cognitive status: History of cognitive impairments - at baseline   SENSATION: Light touch: Impaired  Proprioception: Impaired  Able to detect depe pressure   COORDINATION: Increased tone on the RLE   EDEMA:  Mild RLE distal edema   MUSCLE TONE: RLE: Mild and Moderate  MUSCLE LENGTH: Hamstrings: grossly limited   POSTURE: rounded shoulders, forward head, and posterior bias   LOWER EXTREMITY ROM:     Active  Right Eval Left Eval  Hip flexion  Limited to 5 deg beyond 90 in sitting  Knee flexion  90deg in sitting  Knee extension  Lacking 10 deg full extension  Ankle dorsiflexion  none  Ankle plantarflexion  none   (Blank rows = not tested)  LOWER EXTREMITY MMT:    MMT Right Eval Left Eval  Hip flexion  2+  Hip extension    Hip abduction  2  Hip adduction  3  Hip internal rotation    Hip external rotation    Knee flexion  2+  Knee extension  3+  Ankle dorsiflexion  0  Ankle plantarflexion  0  Ankle inversion    Ankle eversion    (Blank rows = not tested)  BED MOBILITY:  Sit to supine Mod A Supine to sit Mod A Rolling to Right Min A Rolling to Left Min A  TRANSFERS: Assistive device utilized: Hemi walker  Sit to stand: Mod A Stand to sit: Mod A Chair to chair: Mod A Floor: unable to perform   CURB:  Level of Assistance: Total A Assistive device utilized: rail Curb Comments: unable to perform   GAIT: Gait pattern:  non-functional constant posterior bias , step to pattern, decreased stance time- Right, Right hip hike, lateral hip instability, and lateral lean- Left Distance walked: 52ft Assistive device utilized: Hemi walker Level of assistance: Mod A  Comments: moderate cues for sequencing of HW and gait pattern in turns   FUNCTIONAL TESTS:  5 times sit to stand: 48 sec with mod Assist from PT  Timed up and go (TUG): unable to perform  10 meter walk test: 6 ft with min-mod assist and HW.  FIST:                                                                                                                               TREATMENT DATE: 08/20/2024   Therapeutic Activities: dynamic therapeutic activities designed to achieve improved functional performance   Sit to stand x 10 with LUE pushing up from armrest and positioning   Static stand with LUE Support x 3 min  Dynamic weight shift L-R x 20 reps  Static stand without UE support- x 2 min  Static stand with Dynamic UE movement- arranging magnet letters to write her name on dry erase board x 4.   Dynamic side stepping - holding onto support bar - down and back x 1 then brief rest   Fwd/retro walk alongside support bar x approx 5 feet and back x 3.  Standing mini squats 2 x 10 reps (CGA only)       PATIENT EDUCATION: Education details: .  Pt educated throughout session about proper posture and technique with exercises. Improved exercise technique, movement at target joints, use of target muscles after min to mod verbal, visual, tactile cues Interpretation of outcome measure; need for continued progress to justify continued PT services.   Person educated: Patient Education method: Explanation Education comprehension: verbalized understanding  HOME EXERCISE PROGRAM: Access Code: [email or web address] URL: [email or web address] Date: 12/05/2023 Prepared by: Massie Dollar  Exercises - Sit to Stand with Counter Support  - 1 x daily - 3-4 x  weekly - 2 sets - 10 reps - Seated Long Arc Quad  - 1 x daily - 3-4 x weekly - 2 sets - 10 reps - 2 hold - Seated March  - 1 x daily - 3-4 x weekly - 2 sets - 10 reps - 2 hold - Standing Hip Abduction with Counter Support  - 1 x daily - 7 x weekly - 3 sets - 5 reps - Wide Stance with Counter Support  - 1 x daily - 7 x weekly - 3 sets - 2 reps - 30 hold - Seated Hip Abduction  - 1 x daily - 7 x weekly - 3 sets - 10 reps - Supine Bridge  - 1 x daily - 7 x weekly - 3 sets - 10 reps - Supine Short Arc Quad  - 1 x daily - 7 x weekly - 3 sets - 10 reps - Bent Knee Fallouts  - 1 x daily - 7 x weekly - 3 sets - 10 reps - Small Range Straight Leg Raise  - 1 x daily - 7 x weekly - 3 sets - 10 reps - Supine Heel Slide  -  1 x daily - 7 x weekly - 3 sets - 10 reps  GOALS: Goals reviewed with patient? Yes  SHORT TERM GOALS: Target date: 08/03/2024  Patient will be independent in home exercise program to improve strength/mobility for better functional independence with ADLs. Baseline: to be provided on visit 2: 12/28/2023- No caregiver with patient to determine if compliant with HEP Goal  5/13: daugher states that they are completing some exercises almost daily; 04/09/2024= No caregiver present today to f/u with compliance of HEP- Patient verbalized yes and stated standing, moving my legs. 07/02/2024= upon last conversation with patient dtr at last visit- she is going to talk to the lady who sometimes bring patient to clinic to see if she can do some standing and the HEP at the facility with patient.  status: IN PROGRESS  LONG TERM GOALS: Target date: 09/24/2024  2.  Patient (> 54 years old) will complete five times sit to stand test by 15 seconds and only min assist indicating an increased LE strength and improved balance. Baseline: 48 with mod assist from PT; 12/28/2023= 44 sec from EOM at lowest postion (approx 21 in) with Left UE support and CGA to min A from PT 5/13: 28.80 sec with HW from [facility]; min assist  from PT for full erect standing; 04/09/2024= 26.56 sec with HW in front- pushing up using LUE on armrest of w/c- Min Assist for full erect standing 8/26: 25.41 sec with UE pushing from arm rest and reaching for rail on wall; min a to mod assist from PT throughout all repetitions for adequate anterior weight shift; 07/02/2024= 21.98 sec with min physical assist Goal status: PROGRESSING  3.  Patient will increase FIST score by > 6 points to demonstrate decreased fall risk during functional activities Baseline: 37 5/13: 46/56 Goal status: MET  4.  Patient will complete 10 meter walk test to with HW in less than 1 minute with min assist as to improve gait speed for better community ambulation and to reduce fall risk. Baseline: 6 ft with HW and mod assist; 12/28/2023=18 feet with HW- mod assist  5/13: 2:11 min with HW, min-mod assist. 04/09/2024= Patient ambulated approx 22 feet using HW with Min assist and mod VC for gait sequencing-then experienced LOB requiring Mod Assist to maintain standing. 8/26: 2:4min (0 .65m/s) with HW and min fading to mod assist to advance the RLE with fatigue (165 sec); 06/06/2024= 0.85 m/s sec avg with HM with CGA and close w/c follow. 07/02/2024= 78 sec then 73 sec with HW= 0.14 m/s avg.  Goal status: PROGRESSING  5.  Patient will perform stand pivot transfer to and from [facility] with HW and CGA for safety to allow safe transfer to toilet with daughter without use of rail on the wall. Baseline: mod assist and heavy use of rail. 12/28/2023= Min to Mod assist with repetitive VC for technique 5/13: min assist overall with HW; mod assist initially both directions  to prevent posterior LOB. 04/09/2024- Min assist physically in both directions yet increased VC for safety- if provided more time- patient able to make correct movements 75% of time during transfers- still inconsistent with reaching back and pivoting on Right LE  8/26: mod assist from PT to the R and L on this day due to posterior  LOB  07/02/2204- Min assist from w/c to standard chair with VC for hand placement- no posterior lean on both attempts today- min assist to stand and VC for sequencing.  Goal status: Progressing  6.  Patient will improve bed mobility to min assist only to reduce care giver burden and improve safety of transfers  Baseline: mod-max assist 5/13: Min-mod assist for sit<>supine. Min assist only for RLE management for rolling; 04/09/2024- unable to test due to time  8/26. Mod assist for sit<>supine movement at trunk and RLE and min assist for rolling tasks on this day.  Goal status: ONGOING  ASSESSMENT:  CLINICAL IMPRESSION:  Patient arrived with good motivation and performed well with less overall VC for transfers and overall mobility of RLE- Still having difficulty with hip ext and hip add with side stepping or bwd walking but was able to stand well overall today without as much UE support and near full erect posture throughout session. Pt will benefit from continued skilled PT to address strength and balance deficits to improve safety for transfers, reduce care giver burden, and allow return to PLOF and possible return home from Assisted living.   OBJECTIVE IMPAIRMENTS: Abnormal gait, cardiopulmonary status limiting activity, decreased activity tolerance, decreased balance, decreased cognition, decreased coordination, decreased endurance, decreased knowledge of use of DME, decreased mobility, difficulty walking, decreased ROM, decreased strength, hypomobility, increased muscle spasms, impaired flexibility, impaired sensation, impaired tone, impaired UE functional use, improper body mechanics, and postural dysfunction.   ACTIVITY LIMITATIONS: carrying, lifting, bending, standing, squatting, stairs, transfers, bed mobility, continence, bathing, toileting, dressing, reach over head, hygiene/grooming, and locomotion level  PARTICIPATION LIMITATIONS: cleaning, interpersonal relationship, shopping, and  community activity  PERSONAL FACTORS: 3+ comorbidities: HTN, chronic CVA, Afib, Lupus  are also affecting patient's functional outcome.   REHAB POTENTIAL: Fair chronic CVA with poor progress in prior interventions with this clinic  CLINICAL DECISION MAKING: Unstable/unpredictable  EVALUATION COMPLEXITY: High  PLAN:  PT FREQUENCY: 1-2x/week  PT DURATION: 12 weeks  PLANNED INTERVENTIONS: 97110-Therapeutic exercises, 97530- Therapeutic activity, [id number]- Neuromuscular re-education, 97535- Self Care, [id number]- Manual therapy, [phone]- Gait training, [phone]- Orthotic Fit/training, [phone]- Aquatic Therapy, [phone]- Splinting, 97014- Electrical stimulation (unattended), [phone]- Electrical stimulation (manual), Patient/Family education, Balance training, Stair training, Joint mobilization, Vestibular training, Visual/preceptual remediation/compensation, Cognitive remediation, DME instructions, Wheelchair mobility training, Cryotherapy, and Moist heat  PLAN FOR NEXT SESSION:  Continue with Lite Gait Continue with functional standing Continue with transfer safety Continue with gait training.     [name] PT  Physical Therapist- [facility]   10:09 AM 08/20/24

## 2024-08-22 ENCOUNTER — Ambulatory Visit

## 2024-08-27 ENCOUNTER — Ambulatory Visit

## 2024-08-27 DIAGNOSIS — M6281 Muscle weakness (generalized): Secondary | ICD-10-CM

## 2024-08-27 DIAGNOSIS — R41841 Cognitive communication deficit: Secondary | ICD-10-CM

## 2024-08-27 DIAGNOSIS — R269 Unspecified abnormalities of gait and mobility: Secondary | ICD-10-CM

## 2024-08-27 DIAGNOSIS — R2681 Unsteadiness on feet: Secondary | ICD-10-CM | POA: Diagnosis not present

## 2024-08-27 DIAGNOSIS — R482 Apraxia: Secondary | ICD-10-CM

## 2024-08-27 DIAGNOSIS — R278 Other lack of coordination: Secondary | ICD-10-CM

## 2024-08-27 DIAGNOSIS — R2689 Other abnormalities of gait and mobility: Secondary | ICD-10-CM

## 2024-08-27 DIAGNOSIS — R262 Difficulty in walking, not elsewhere classified: Secondary | ICD-10-CM

## 2024-08-27 NOTE — Therapy (Signed)
 OUTPATIENT PHYSICAL THERAPY TREATMENT   Patient Name: Lindsey [name] MRN: [id number] DOB:03-13-1956, 68 y.o., female Today's Date: 08/27/2024  PCP: Lorel Maxie LABOR, MD  REFERRING PROVIDER: Lorilee Sven SQUIBB, MD   END OF SESSION:   PT End of Session - 08/27/24 0856     Visit Number 57    Number of Visits 72    Date for Recertification  09/24/24    Authorization Type Medicare/Medicaid    Progress Note Due on Visit 60    PT Start Time 0848    PT Stop Time 0929    PT Time Calculation (min) 41 min    Equipment Utilized During Treatment Gait belt    Activity Tolerance Patient tolerated treatment well;No increased pain;Patient limited by fatigue    Behavior During Therapy [facility] for tasks assessed/performed                       Past Medical History:  Diagnosis Date   Aphasia    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 No pertinent surgical history. 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   REFERRING DIAG:  Diagnosis  I69.398,R25.2 (ICD-10-CM) - Spasticity as late effect of cerebrovascular accident (CVA)    THERAPY DIAG:  Unsteadiness on feet  Difficulty in walking, not elsewhere classified  Abnormality of gait and mobility  Other abnormalities of gait and mobility  Muscle weakness (generalized)  Other lack of coordination  Apraxia  Cognitive communication deficit  Rationale for Evaluation and Treatment: Rehabilitation  SUBJECTIVE:  SUBJECTIVE STATEMENT: Patient reports doing okay today. Mostly non-verbal- does respond mostly yes and no and occasional short sentence- sometimes unintelligible.    PERTINENT HISTORY:  Daughter present for Evaluation to provide hx. Pt is familiar to this clinic and has been seen for multiple bouts PT since initial CVA in 2021. Pt d/c'ed from PT serviced in April of 2024 with plans to receive PT services through [facility]. Daughter reports that she was only receiving minimal therapy in facility. Reports at least 1 fall in the last 6 months and daughter asked staff to transfer pt with lift since fall.  Daughter would want pt to demonstrate improve safety with transfers to allow bil transfers with reduced use of rail to allow pt to return home for assisted living.    PAIN:  Are you having pain? No  PRECAUTIONS: Fall  RED FLAGS: None   WEIGHT BEARING RESTRICTIONS: No  FALLS: Has patient fallen in last 6 months? Yes. Number of falls 1  LIVING ENVIRONMENT: Lives with: lives with their family and lives in a skilled nursing facility Lives in: Other SNF  Stairs: No Has following equipment at home: Hemi walker and Wheelchair  (manual)  PLOF: Requires assistive device for independence, Needs assistance with ADLs, Needs assistance with homemaking, and WC level currently   PATIENT GOALS: move better - walking or transferring with hemi walking   OBJECTIVE:  Note: Objective measures were completed at Evaluation unless otherwise noted.  DIAGNOSTIC FINDINGS:  Head CT:  IMPRESSION: 1. No acute intracranial abnormality. 2. No acute displaced fracture or traumatic listhesis of the cervical spine.  Cervical CT:  MPRESSION: 1. No acute intracranial abnormality. 2. No acute displaced fracture or traumatic listhesis of the cervical spine.  COGNITION: Overall cognitive status: History of cognitive impairments - at baseline   SENSATION: Light touch: Impaired  Proprioception: Impaired  Able to detect depe pressure   COORDINATION: Increased tone on the RLE   EDEMA:  Mild RLE distal edema   MUSCLE TONE: RLE: Mild and Moderate  MUSCLE LENGTH: Hamstrings: grossly limited   POSTURE: rounded shoulders, forward head, and posterior bias   LOWER EXTREMITY ROM:     Active  Right Eval Left Eval  Hip flexion  Limited to 5 deg beyond 90 in sitting  Knee flexion  90deg in sitting  Knee extension  Lacking 10 deg full extension  Ankle dorsiflexion  none  Ankle plantarflexion  none   (Blank rows = not tested)  LOWER EXTREMITY MMT:    MMT Right Eval Left Eval  Hip flexion  2+  Hip extension    Hip abduction  2  Hip adduction  3  Hip internal rotation    Hip external rotation    Knee flexion  2+  Knee extension  3+  Ankle dorsiflexion  0  Ankle plantarflexion  0  Ankle inversion    Ankle eversion    (Blank rows = not tested)  BED MOBILITY:  Sit to supine Mod A Supine to sit Mod A Rolling to Right Min A Rolling to Left Min A  TRANSFERS: Assistive device utilized: Hemi walker  Sit to stand: Mod A Stand to sit: Mod A Chair to chair: Mod A Floor: unable to perform   CURB:  Level of  Assistance: Total A Assistive device utilized: rail Curb Comments: unable to perform   GAIT: Gait pattern: non-functional constant posterior bias , step to pattern, decreased stance time- Right, Right hip hike, lateral hip instability, and lateral lean- Left  Distance walked: 61ft Assistive device utilized: Hemi walker Level of assistance: Mod A Comments: moderate cues for sequencing of HW and gait pattern in turns   FUNCTIONAL TESTS:  5 times sit to stand: 48 sec with mod Assist from PT  Timed up and go (TUG): unable to perform  10 meter walk test: 6 ft with min-mod assist and HW.  FIST:                                                                                                                               TREATMENT DATE: 08/27/2024  *Patient received in manual w/c without caregiver present today.   Therapeutic Activities: dynamic therapeutic activities designed to achieve improved functional performance   Reviewed self stretching for mobility and tone- having patient wheel up to wall and placed tip of R shoe at wall then manual propelled walker - gentle fwd/bwd rocking to work on stretching and mobilizing R LE x 3 min  Sit to stand x 10 with LUE pushing up from armrest and positioning   Static stand with LUE Support x 5 min  (focusing on Standing as erect as possible- Increased overall VC to stay upright)  Dynamic standing weight shift L-R x 20 reps  x 3 sets Static stand without UE support- x 2 min  Static stand with horizontal and vertical head turns (no UE support) x 10 reps each.  Standing mini squats 2 x 10 reps (CGA only)   Gait training:  Patient ambulated approx 25 feet today- using hemiwalker on left side, Gait belt and CGA with close w/c follow. Patient exhibited short step to gait sequencing with increased overall VC to advance RLE- more difficulty advancing with increased overall ext tone making it difficult to flex her hip/knee. She was visibly frustrated today  overall and shaking her head during walking.   *Patient declined to walk a second trial today despite max prompting.   Therapeutic Exercise: therapeutic exercises to develop strength and endurance, range of motion and flexibility:  -Hip flex (AAROM) 2 x 10 on RLE; AROM 2x 10 on LLE -Hip abd (AAROM) 2 x 10 on RLE; AROM 2x 10 on LLE -Knee ext -AROM each LE 2 x 10  -Heel raise (LLE) 2 x 10  -Manual hip add/add -hold 3 sec x 10 reps each.      PATIENT EDUCATION: Education details: .  Pt educated throughout session about proper posture and technique with exercises. Improved exercise technique, movement at target joints, use of target muscles after min to mod verbal, visual, tactile cues Interpretation of outcome measure; need for continued progress to justify continued PT services.   Person educated: Patient Education method: Explanation Education comprehension: verbalized understanding  HOME EXERCISE PROGRAM: Access Code: [email or web address] URL: [email or web address] Date: 12/05/2023 Prepared by: Massie Dollar  Exercises - Sit to Stand with Counter Support  - 1 x daily - 3-4 x weekly - 2 sets - 10 reps - Seated Long  Arc Quad  - 1 x daily - 3-4 x weekly - 2 sets - 10 reps - 2 hold - Seated March  - 1 x daily - 3-4 x weekly - 2 sets - 10 reps - 2 hold - Standing Hip Abduction with Counter Support  - 1 x daily - 7 x weekly - 3 sets - 5 reps - Wide Stance with Counter Support  - 1 x daily - 7 x weekly - 3 sets - 2 reps - 30 hold - Seated Hip Abduction  - 1 x daily - 7 x weekly - 3 sets - 10 reps - Supine Bridge  - 1 x daily - 7 x weekly - 3 sets - 10 reps - Supine Short Arc Quad  - 1 x daily - 7 x weekly - 3 sets - 10 reps - Bent Knee Fallouts  - 1 x daily - 7 x weekly - 3 sets - 10 reps - Small Range Straight Leg Raise  - 1 x daily - 7 x weekly - 3 sets - 10 reps - Supine Heel Slide  - 1 x daily - 7 x weekly - 3 sets - 10 reps  GOALS: Goals reviewed with patient? Yes  SHORT  TERM GOALS: Target date: 08/03/2024  Patient will be independent in home exercise program to improve strength/mobility for better functional independence with ADLs. Baseline: to be provided on visit 2: 12/28/2023- No caregiver with patient to determine if compliant with HEP Goal  5/13: daugher states that they are completing some exercises almost daily; 04/09/2024= No caregiver present today to f/u with compliance of HEP- Patient verbalized yes and stated standing, moving my legs. 07/02/2024= upon last conversation with patient dtr at last visit- she is going to talk to the lady who sometimes bring patient to clinic to see if she can do some standing and the HEP at the facility with patient.  status: IN PROGRESS  LONG TERM GOALS: Target date: 09/24/2024  2.  Patient (> 26 years old) will complete five times sit to stand test by 15 seconds and only min assist indicating an increased LE strength and improved balance. Baseline: 48 with mod assist from PT; 12/28/2023= 44 sec from EOM at lowest postion (approx 21 in) with Left UE support and CGA to min A from PT 5/13: 28.80 sec with HW from [facility]; min assist from PT for full erect standing; 04/09/2024= 26.56 sec with HW in front- pushing up using LUE on armrest of w/c- Min Assist for full erect standing 8/26: 25.41 sec with UE pushing from arm rest and reaching for rail on wall; min a to mod assist from PT throughout all repetitions for adequate anterior weight shift; 07/02/2024= 21.98 sec with min physical assist Goal status: PROGRESSING  3.  Patient will increase FIST score by > 6 points to demonstrate decreased fall risk during functional activities Baseline: 37 5/13: 46/56 Goal status: MET  4.  Patient will complete 10 meter walk test to with HW in less than 1 minute with min assist as to improve gait speed for better community ambulation and to reduce fall risk. Baseline: 6 ft with HW and mod assist; 12/28/2023=18 feet with HW- mod assist  5/13: 2:11 min  with HW, min-mod assist. 04/09/2024= Patient ambulated approx 22 feet using HW with Min assist and mod VC for gait sequencing-then experienced LOB requiring Mod Assist to maintain standing. 8/26: 2:37min (0 .29m/s) with HW and min fading to mod assist to advance the RLE  with fatigue (165 sec); 06/06/2024= 0.85 m/s sec avg with HM with CGA and close w/c follow. 07/02/2024= 78 sec then 73 sec with HW= 0.14 m/s avg.  Goal status: PROGRESSING  5.  Patient will perform stand pivot transfer to and from [facility] with HW and CGA for safety to allow safe transfer to toilet with daughter without use of rail on the wall. Baseline: mod assist and heavy use of rail. 12/28/2023= Min to Mod assist with repetitive VC for technique 5/13: min assist overall with HW; mod assist initially both directions  to prevent posterior LOB. 04/09/2024- Min assist physically in both directions yet increased VC for safety- if provided more time- patient able to make correct movements 75% of time during transfers- still inconsistent with reaching back and pivoting on Right LE  8/26: mod assist from PT to the R and L on this day due to posterior LOB  07/02/2204- Min assist from w/c to standard chair with VC for hand placement- no posterior lean on both attempts today- min assist to stand and VC for sequencing.  Goal status: Progressing  6.  Patient will improve bed mobility to min assist only to reduce care giver burden and improve safety of transfers  Baseline: mod-max assist 5/13: Min-mod assist for sit<>supine. Min assist only for RLE management for rolling; 04/09/2024- unable to test due to time  8/26. Mod assist for sit<>supine movement at trunk and RLE and min assist for rolling tasks on this day.  Goal status: ONGOING  ASSESSMENT:  CLINICAL IMPRESSION:  Patient was visible frustrated during session today- pointing at her RLE and stating it wont move referring to her RLE. She did present with increased tone overall today with  standing/walking and even sitting in w/c. Reviewed strategy to work on flexing her knee and she was able to complete. She declined to walk a second trial today again frustrated at her performance. Detailed discussion about good vs. Rough days and importance of just trying her best even on hard days for max rehab benefit.  Pt will benefit from continued skilled PT to address strength and balance deficits to improve safety for transfers, reduce care giver burden, and allow return to PLOF and possible return home from Assisted living.   OBJECTIVE IMPAIRMENTS: Abnormal gait, cardiopulmonary status limiting activity, decreased activity tolerance, decreased balance, decreased cognition, decreased coordination, decreased endurance, decreased knowledge of use of DME, decreased mobility, difficulty walking, decreased ROM, decreased strength, hypomobility, increased muscle spasms, impaired flexibility, impaired sensation, impaired tone, impaired UE functional use, improper body mechanics, and postural dysfunction.   ACTIVITY LIMITATIONS: carrying, lifting, bending, standing, squatting, stairs, transfers, bed mobility, continence, bathing, toileting, dressing, reach over head, hygiene/grooming, and locomotion level  PARTICIPATION LIMITATIONS: cleaning, interpersonal relationship, shopping, and community activity  PERSONAL FACTORS: 3+ comorbidities: HTN, chronic CVA, Afib, Lupus  are also affecting patient's functional outcome.   REHAB POTENTIAL: Fair chronic CVA with poor progress in prior interventions with this clinic  CLINICAL DECISION MAKING: Unstable/unpredictable  EVALUATION COMPLEXITY: High  PLAN:  PT FREQUENCY: 1-2x/week  PT DURATION: 12 weeks  PLANNED INTERVENTIONS: 97110-Therapeutic exercises, 97530- Therapeutic activity, [id number]- Neuromuscular re-education, 97535- Self Care, [id number]- Manual therapy, [phone]- Gait training, [phone]- Orthotic Fit/training, [phone]- Aquatic Therapy, [phone]- Splinting, [id number]-  Electrical stimulation (unattended), [phone]- Electrical stimulation (manual), Patient/Family education, Balance training, Stair training, Joint mobilization, Vestibular training, Visual/preceptual remediation/compensation, Cognitive remediation, DME instructions, Wheelchair mobility training, Cryotherapy, and Moist heat  PLAN FOR NEXT SESSION:  Continue with Lite Gait Continue with functional standing  Continue with transfer safety Continue with gait training.     [name] PT  Physical Therapist- [facility]   10:05 AM 08/27/24

## 2024-09-02 NOTE — Therapy (Signed)
 OUTPATIENT PHYSICAL THERAPY TREATMENT   Patient Name: Lindsey [name] MRN: [id number] DOB:1956/04/02, 68 y.o., female Today's Date: 09/03/2024  PCP: Lorel Maxie LABOR, MD  REFERRING PROVIDER: Lorilee Sven SQUIBB, MD   END OF SESSION:   PT End of Session - 09/03/24 0855     Visit Number 58    Number of Visits 72    Date for Recertification  09/24/24    Authorization Type Medicare/Medicaid    Progress Note Due on Visit 60    PT Start Time 0854    PT Stop Time 0928    PT Time Calculation (min) 34 min    Equipment Utilized During Treatment Gait belt    Activity Tolerance Patient tolerated treatment well;No increased pain;Patient limited by fatigue    Behavior During Therapy [facility] for tasks assessed/performed                        Past Medical History:  Diagnosis Date   Aphasia    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 No pertinent surgical history. 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 Reports had a good Thanksgiving.     PERTINENT HISTORY:  Daughter present for Evaluation to provide hx. Pt is familiar to this clinic and has been seen for multiple bouts PT since initial CVA in 2021. Pt d/c'ed from PT serviced in April of 2024 with plans to receive PT services through [facility]. Daughter reports that she was only receiving minimal therapy in facility. Reports at least 1 fall in the last 6 months and daughter asked staff to transfer pt with lift since fall.  Daughter would want pt to demonstrate improve safety with transfers to allow bil transfers with reduced use of rail to allow pt to return home for assisted living.    PAIN:  Are you having pain? No  PRECAUTIONS: Fall  RED FLAGS: None   WEIGHT BEARING RESTRICTIONS: No  FALLS: Has patient fallen in last 6 months? Yes. Number of falls 1  LIVING ENVIRONMENT: Lives with: lives with their family and lives in a skilled nursing facility Lives in: Other SNF  Stairs: No Has following equipment  at home: Hemi walker and Wheelchair (manual)  PLOF: Requires assistive device for independence, Needs assistance with ADLs, Needs assistance with homemaking, and WC level currently   PATIENT GOALS: move better - walking or transferring with hemi walking   OBJECTIVE:  Note: Objective measures were completed at Evaluation unless otherwise noted.  DIAGNOSTIC FINDINGS:  Head CT:  IMPRESSION: 1. No acute intracranial abnormality. 2. No acute displaced fracture or traumatic listhesis of the cervical spine.  Cervical CT:  MPRESSION: 1. No acute intracranial abnormality. 2. No acute displaced fracture or traumatic listhesis of the cervical spine.  COGNITION: Overall cognitive status: History of cognitive impairments - at baseline   SENSATION: Light touch: Impaired  Proprioception: Impaired  Able to detect depe pressure   COORDINATION: Increased tone on the RLE   EDEMA:  Mild RLE distal edema   MUSCLE TONE: RLE: Mild and Moderate  MUSCLE LENGTH: Hamstrings: grossly limited   POSTURE: rounded shoulders, forward head, and posterior bias   LOWER EXTREMITY ROM:     Active  Right Eval Left Eval  Hip flexion  Limited to 5 deg beyond 90 in sitting  Knee flexion  90deg in sitting  Knee extension  Lacking 10 deg full extension  Ankle dorsiflexion  none  Ankle plantarflexion  none   (Blank rows = not tested)  LOWER EXTREMITY MMT:    MMT Right Eval Left Eval  Hip flexion  2+  Hip extension    Hip abduction  2  Hip adduction  3  Hip internal rotation    Hip external rotation    Knee flexion  2+  Knee extension  3+  Ankle dorsiflexion  0  Ankle plantarflexion  0  Ankle inversion    Ankle eversion    (Blank rows = not tested)  BED MOBILITY:  Sit to supine Mod A Supine to sit Mod A Rolling to Right Min A Rolling to Left Min A  TRANSFERS: Assistive device utilized: Hemi walker  Sit to stand: Mod A Stand to sit: Mod A Chair to chair: Mod A Floor: unable to  perform   CURB:  Level of Assistance: Total A Assistive device utilized: rail Curb Comments: unable to perform   GAIT: Gait pattern: non-functional constant posterior bias , step to pattern, decreased stance time- Right, Right hip hike, lateral hip instability, and lateral lean- Left Distance walked:  53ft Assistive device utilized: Hemi walker Level of assistance: Mod A Comments: moderate cues for sequencing of HW and gait pattern in turns   FUNCTIONAL TESTS:  5 times sit to stand: 48 sec with mod Assist from PT  Timed up and go (TUG): unable to perform  10 meter walk test: 6 ft with min-mod assist and HW.  FIST:                                                                                                                               TREATMENT DATE: 09/03/2024  *Patient received in manual w/c without caregiver present today.   Therapeutic Activities: dynamic therapeutic activities designed to achieve improved functional performance   Self stretching for mobility and tone- having patient wheel up to wall and placed tip of R shoe at wall then manual propelled walker - gentle fwd/bwd rocking to work on stretching and mobilizing R LE x 3 min  Sit to stand x 10 with LUE pushing up from armrest and positioning   Static stand with LUE Support x 2 min  (focusing on Standing as erect as possible) Progressed to dynamic weight shifting without UE support L-R x 20 reps  x 3 sets Static stand without UE support- x 2 min- Increased difficulty today with posterior lean   Static stand with dynamic Left arm activity (standing at dry erase board) - moving magnet letters into design focusing on dynamic UE reaching- overhead, chest level, abducted and cross body today - 2 seated rest breaks but approx 12 min of standing to complete.         PATIENT EDUCATION: Education details: .  Pt educated throughout session about proper posture and technique with exercises. Improved exercise technique,  movement at target joints, use of target muscles after min to mod verbal, visual, tactile cues Interpretation of outcome measure; need for continued progress to justify continued PT services.   Person educated: Patient Education method: Explanation Education comprehension: verbalized understanding  HOME EXERCISE PROGRAM: Access Code: [email or web address] URL: [email or web address] Date: 12/05/2023 Prepared by: Massie Dollar  Exercises - Sit to Stand with Counter Support  - 1 x daily - 3-4 x weekly - 2 sets - 10 reps - Seated Long Arc Quad  - 1 x daily - 3-4 x weekly - 2 sets - 10 reps - 2 hold - Seated March  - 1 x daily - 3-4 x weekly - 2 sets - 10 reps - 2 hold - Standing Hip Abduction with Counter Support  - 1 x daily - 7 x weekly - 3 sets - 5 reps - Wide Stance with Counter Support  - 1 x daily - 7 x weekly - 3 sets - 2 reps - 30 hold - Seated Hip Abduction  - 1 x daily - 7 x weekly - 3 sets - 10 reps - Supine Bridge  - 1 x daily - 7 x weekly - 3 sets - 10 reps -  Supine Short Arc Quad  - 1 x daily - 7 x weekly - 3 sets - 10 reps - Bent Knee Fallouts  - 1 x daily - 7 x weekly - 3 sets - 10 reps - Small Range Straight Leg Raise  - 1 x daily - 7 x weekly - 3 sets - 10 reps - Supine Heel Slide  - 1 x daily - 7 x weekly - 3 sets - 10 reps  GOALS: Goals reviewed with patient? Yes  SHORT TERM GOALS: Target date: 08/03/2024  Patient will be independent in home exercise program to improve strength/mobility for better functional independence with ADLs. Baseline: to be provided on visit 2: 12/28/2023- No caregiver with patient to determine if compliant with HEP Goal  5/13: daugher states that they are completing some exercises almost daily; 04/09/2024= No caregiver present today to f/u with compliance of HEP- Patient verbalized yes and stated standing, moving my legs. 07/02/2024= upon last conversation with patient dtr at last visit- she is going to talk to the lady who sometimes bring patient  to clinic to see if she can do some standing and the HEP at the facility with patient.  status: IN PROGRESS  LONG TERM GOALS: Target date: 09/24/2024  2.  Patient (> 60 years old) will complete five times sit to stand test by 15 seconds and only min assist indicating an increased LE strength and improved balance. Baseline: 48 with mod assist from PT; 12/28/2023= 44 sec from EOM at lowest postion (approx 21 in) with Left UE support and CGA to min A from PT 5/13: 28.80 sec with HW from [facility]; min assist from PT for full erect standing; 04/09/2024= 26.56 sec with HW in front- pushing up using LUE on armrest of w/c- Min Assist for full erect standing 8/26: 25.41 sec with UE pushing from arm rest and reaching for rail on wall; min a to mod assist from PT throughout all repetitions for adequate anterior weight shift; 07/02/2024= 21.98 sec with min physical assist Goal status: PROGRESSING  3.  Patient will increase FIST score by > 6 points to demonstrate decreased fall risk during functional activities Baseline: 37 5/13: 46/56 Goal status: MET  4.  Patient will complete 10 meter walk test to with HW in less than 1 minute with min assist as to improve gait speed for better community ambulation and to reduce fall risk. Baseline: 6 ft with HW and mod assist; 12/28/2023=18 feet with HW- mod assist  5/13: 2:11 min with HW, min-mod assist. 04/09/2024= Patient ambulated approx 22 feet using HW with Min assist and mod VC for gait sequencing-then experienced LOB requiring Mod Assist to maintain standing. 8/26: 2:31min (0 .48m/s) with HW and min fading to mod assist to advance the RLE with fatigue (165 sec); 06/06/2024= 0.85 m/s sec avg with HM with CGA and close w/c follow. 07/02/2024= 78 sec then 73 sec with HW= 0.14 m/s avg.  Goal status: PROGRESSING  5.  Patient will perform stand pivot transfer to and from [facility] with HW and CGA for safety to allow safe transfer to toilet with daughter without use of rail on the  wall. Baseline: mod assist and heavy use of rail. 12/28/2023= Min to Mod assist with repetitive VC for technique 5/13: min assist overall with HW; mod assist initially both directions  to prevent posterior LOB. 04/09/2024- Min assist physically in both directions yet increased VC for safety- if provided more time- patient able to make correct movements 75% of time  during transfers- still inconsistent with reaching back and pivoting on Right LE  8/26: mod assist from PT to the R and L on this day due to posterior LOB  07/02/2204- Min assist from w/c to standard chair with VC for hand placement- no posterior lean on both attempts today- min assist to stand and VC for sequencing.  Goal status: Progressing  6.  Patient will improve bed mobility to min assist only to reduce care giver burden and improve safety of transfers  Baseline: mod-max assist 5/13: Min-mod assist for sit<>supine. Min assist only for RLE management for rolling; 04/09/2024- unable to test due to time  8/26. Mod assist for sit<>supine movement at trunk and RLE and min assist for rolling tasks on this day.  Goal status: ONGOING  ASSESSMENT:  CLINICAL IMPRESSION:  Treatment was limited to patient's late arrival. She presented today with improved demeanor and more positive attitude today with session. She exhibited no shaking of her head and engaged in all standing activities. She was fatigued with visibly shaking RLE at end of session. She was able to reach diagonal with good weight shifting to affected R side today.Pt will benefit from continued skilled PT to address strength and balance deficits to improve safety for transfers, reduce care giver burden, and allow return to PLOF and possible return home from Assisted living.   OBJECTIVE IMPAIRMENTS: Abnormal gait, cardiopulmonary status limiting activity, decreased activity tolerance, decreased balance, decreased cognition, decreased coordination, decreased endurance, decreased knowledge of  use of DME, decreased mobility, difficulty walking, decreased ROM, decreased strength, hypomobility, increased muscle spasms, impaired flexibility, impaired sensation, impaired tone, impaired UE functional use, improper body mechanics, and postural dysfunction.   ACTIVITY LIMITATIONS: carrying, lifting, bending, standing, squatting, stairs, transfers, bed mobility, continence, bathing, toileting, dressing, reach over head, hygiene/grooming, and locomotion level  PARTICIPATION LIMITATIONS: cleaning, interpersonal relationship, shopping, and community activity  PERSONAL FACTORS: 3+ comorbidities: HTN, chronic CVA, Afib, Lupus  are also affecting patient's functional outcome.   REHAB POTENTIAL: Fair chronic CVA with poor progress in prior interventions with this clinic  CLINICAL DECISION MAKING: Unstable/unpredictable  EVALUATION COMPLEXITY: High  PLAN:  PT FREQUENCY: 1-2x/week  PT DURATION: 12 weeks  PLANNED INTERVENTIONS: 97110-Therapeutic exercises, 97530- Therapeutic activity, [id number]- Neuromuscular re-education, 97535- Self Care, [id number]- Manual therapy, [phone]- Gait training, [phone]- Orthotic Fit/training, [phone]- Aquatic Therapy, [phone]- Splinting, [id number]- Electrical stimulation (unattended), [phone]- Electrical stimulation (manual), Patient/Family education, Balance training, Stair training, Joint mobilization, Vestibular training, Visual/preceptual remediation/compensation, Cognitive remediation, DME instructions, Wheelchair mobility training, Cryotherapy, and Moist heat  PLAN FOR NEXT SESSION:  Continue with Lite Gait Continue with functional standing Continue with transfer safety Continue with gait training.     [name] PT  Physical Therapist- [place]  [facility]   5:00 PM 09/03/24

## 2024-09-03 ENCOUNTER — Ambulatory Visit

## 2024-09-03 DIAGNOSIS — R278 Other lack of coordination: Secondary | ICD-10-CM | POA: Diagnosis present

## 2024-09-03 DIAGNOSIS — R482 Apraxia: Secondary | ICD-10-CM | POA: Diagnosis present

## 2024-09-03 DIAGNOSIS — R2689 Other abnormalities of gait and mobility: Secondary | ICD-10-CM | POA: Diagnosis present

## 2024-09-03 DIAGNOSIS — R41841 Cognitive communication deficit: Secondary | ICD-10-CM | POA: Insufficient documentation

## 2024-09-03 DIAGNOSIS — R269 Unspecified abnormalities of gait and mobility: Secondary | ICD-10-CM | POA: Diagnosis present

## 2024-09-03 DIAGNOSIS — R262 Difficulty in walking, not elsewhere classified: Secondary | ICD-10-CM | POA: Diagnosis present

## 2024-09-03 DIAGNOSIS — R2681 Unsteadiness on feet: Secondary | ICD-10-CM | POA: Diagnosis present

## 2024-09-03 DIAGNOSIS — I63542 Cerebral infarction due to unspecified occlusion or stenosis of left cerebellar artery: Secondary | ICD-10-CM | POA: Diagnosis present

## 2024-09-03 DIAGNOSIS — M6281 Muscle weakness (generalized): Secondary | ICD-10-CM | POA: Diagnosis present

## 2024-09-05 ENCOUNTER — Ambulatory Visit

## 2024-09-09 NOTE — Therapy (Signed)
 OUTPATIENT PHYSICAL THERAPY TREATMENT   Patient Name: Lindsey [name] MRN: [id number] DOB:Oct 13, 1955, 68 y.o., female Today's Date: 09/10/2024  PCP: Lorel Maxie LABOR, MD  REFERRING PROVIDER: Lorilee Sven SQUIBB, MD   END OF SESSION:   PT End of Session - 09/10/24 0854     Visit Number 59    Number of Visits 72    Date for Recertification  09/24/24    Authorization Type Medicare/Medicaid    Progress Note Due on Visit 60    PT Start Time [phone]    Equipment Utilized During Treatment Gait belt    Activity Tolerance Patient tolerated treatment well;No increased pain;Patient limited by fatigue    Behavior During Therapy [facility] for tasks assessed/performed                         Past Medical History:  Diagnosis Date   Aphasia    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 No pertinent surgical history. 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   REFERRING DIAG:  Diagnosis  I69.398,R25.2 (ICD-10-CM) - Spasticity as late effect of cerebrovascular accident (CVA)    THERAPY DIAG:  Unsteadiness on feet  Difficulty in walking, not elsewhere classified  Abnormality of gait and mobility  Other abnormalities of gait and mobility  Muscle weakness (generalized)  Other lack of coordination  Apraxia  Cognitive communication deficit  Rationale for Evaluation and Treatment: Rehabilitation  SUBJECTIVE:  SUBJECTIVE STATEMENT:   Patient reports no new issues today and states RLE not as stiff as a couple of weeks ago.    PERTINENT HISTORY:  Daughter present for Evaluation to provide hx. Pt is familiar to this clinic and has been seen for multiple bouts PT since initial CVA in 2021. Pt d/c'ed from PT serviced in April of 2024 with plans to receive PT services through [facility]. Daughter reports that she was only receiving minimal therapy in facility. Reports at least 1 fall in the last 6 months and daughter asked staff to transfer pt with lift since fall.  Daughter would want pt to demonstrate improve safety with transfers to allow bil transfers with reduced use of rail to allow pt to return home for assisted living.    PAIN:  Are you having pain? No  PRECAUTIONS: Fall  RED FLAGS: None   WEIGHT BEARING RESTRICTIONS: No  FALLS: Has patient fallen in last 6 months? Yes. Number of falls 1  LIVING ENVIRONMENT: Lives with: lives with their family and lives in a skilled nursing facility Lives in: Other SNF  Stairs: No Has following equipment at home: Hemi walker and Wheelchair (manual)  PLOF: Requires assistive device for independence, Needs assistance with ADLs, Needs assistance with  homemaking, and WC level currently   PATIENT GOALS: move better - walking or transferring with hemi walking   OBJECTIVE:  Note: Objective measures were completed at Evaluation unless otherwise noted.  DIAGNOSTIC FINDINGS:  Head CT:  IMPRESSION: 1. No acute intracranial abnormality. 2. No acute displaced fracture or traumatic listhesis of the cervical spine.  Cervical CT:  MPRESSION: 1. No acute intracranial abnormality. 2. No acute displaced fracture or traumatic listhesis of the cervical spine.  COGNITION: Overall cognitive status: History of cognitive impairments - at baseline   SENSATION: Light touch: Impaired  Proprioception: Impaired  Able to detect depe pressure   COORDINATION: Increased tone on the RLE   EDEMA:  Mild RLE distal edema   MUSCLE TONE: RLE: Mild and Moderate  MUSCLE LENGTH: Hamstrings: grossly limited   POSTURE: rounded shoulders, forward head, and posterior bias   LOWER EXTREMITY ROM:     Active  Right Eval Left Eval  Hip flexion  Limited to 5 deg beyond 90 in sitting  Knee flexion  90deg in sitting  Knee extension  Lacking 10 deg full extension  Ankle dorsiflexion  none  Ankle plantarflexion  none   (Blank rows = not tested)  LOWER EXTREMITY MMT:    MMT Right Eval Left Eval  Hip flexion  2+  Hip extension    Hip abduction  2  Hip adduction  3  Hip internal rotation    Hip external rotation    Knee flexion  2+  Knee extension  3+  Ankle dorsiflexion  0  Ankle plantarflexion  0  Ankle inversion    Ankle eversion    (Blank rows = not tested)  BED MOBILITY:  Sit to supine Mod A Supine to sit Mod A Rolling to Right Min A Rolling to Left Min A  TRANSFERS: Assistive device utilized: Hemi walker  Sit to stand: Mod A Stand to sit: Mod A Chair to chair: Mod A Floor: unable to perform   CURB:  Level of Assistance: Total A Assistive device utilized: rail Curb Comments: unable to perform   GAIT: Gait pattern:  non-functional constant posterior bias , step to pattern, decreased stance time- Right, Right hip hike, lateral hip instability, and lateral lean-  Left Distance walked: 41ft Assistive device utilized: Hemi walker Level of assistance: Mod A Comments: moderate cues for sequencing of HW and gait pattern in turns   FUNCTIONAL TESTS:  5 times sit to stand: 48 sec with mod Assist from PT  Timed up and go (TUG): unable to perform  10 meter walk test: 6 ft with min-mod assist and HW.  FIST:                                                                                                                               TREATMENT DATE: 09/10/2024  *Patient received in manual w/c without caregiver present today.  *upon standing- noticed pants were staying up well- They were unbuttoned - attempted to try to button but too tight today.    Therapeutic Activities: dynamic therapeutic activities designed to achieve improved functional performance   Self stretching for mobility and tone- having patient wheel up to wall and placed tip of R shoe at wall then manual propelled walker - gentle fwd/bwd rocking to work on stretching and mobilizing R LE x 2 min - followed up by PROM to R knee flex x 2 min  Sit to stand  2 x 10 with LUE (1st round pulling up from support bar and second set-  pushing up from armrest and positioning - Max VC and reminders to keep LUE on armrest.   Progressed to dynamic weight shifting without UE support L-R x 20 reps    Standing with advancing RLE fwd then backward x 15 ( difficulty with hip ext- and weight shifting with very mimimal ability to flex knee to clear foot off floor with bwd step)   Lateral stepping at support bar- start on right end and side step to left then back to right x 1 (very fatiguing- min assist intermittent with side stepping RLE to right). Patient performed 2 more trials -          PATIENT EDUCATION: Education details: .  Pt educated throughout session  about proper posture and technique with exercises. Improved exercise technique, movement at target joints, use of target muscles after min to mod verbal, visual, tactile cues Interpretation of outcome measure; need for continued progress to justify continued PT services.   Person educated: Patient Education method: Explanation Education comprehension: verbalized understanding  HOME EXERCISE PROGRAM: Access Code: [email or web address] URL: [email or web address] Date: 12/05/2023 Prepared by: Massie Dollar  Exercises - Sit to Stand with Counter Support  - 1 x daily - 3-4 x weekly - 2 sets - 10 reps - Seated Long Arc Quad  - 1 x daily - 3-4 x weekly - 2 sets - 10 reps - 2 hold - Seated March  - 1 x daily - 3-4 x weekly - 2 sets - 10 reps - 2 hold - Standing Hip Abduction with Counter Support  - 1 x daily - 7 x weekly - 3 sets - 5 reps - Wide Stance with Counter  Support  - 1 x daily - 7 x weekly - 3 sets - 2 reps - 30 hold - Seated Hip Abduction  - 1 x daily - 7 x weekly - 3 sets - 10 reps - Supine Bridge  - 1 x daily - 7 x weekly - 3 sets - 10 reps - Supine Short Arc Quad  - 1 x daily - 7 x weekly - 3 sets - 10 reps - Bent Knee Fallouts  - 1 x daily - 7 x weekly - 3 sets - 10 reps - Small Range Straight Leg Raise  - 1 x daily - 7 x weekly - 3 sets - 10 reps - Supine Heel Slide  - 1 x daily - 7 x weekly - 3 sets - 10 reps  GOALS: Goals reviewed with patient? Yes  SHORT TERM GOALS: Target date: 08/03/2024  Patient will be independent in home exercise program to improve strength/mobility for better functional independence with ADLs. Baseline: to be provided on visit 2: 12/28/2023- No caregiver with patient to determine if compliant with HEP Goal  5/13: daugher states that they are completing some exercises almost daily; 04/09/2024= No caregiver present today to f/u with compliance of HEP- Patient verbalized yes and stated standing, moving my legs. 07/02/2024= upon last conversation with patient  dtr at last visit- she is going to talk to the lady who sometimes bring patient to clinic to see if she can do some standing and the HEP at the facility with patient.  status: IN PROGRESS  LONG TERM GOALS: Target date: 09/24/2024  2.  Patient (> 22 years old) will complete five times sit to stand test by 15 seconds and only min assist indicating an increased LE strength and improved balance. Baseline: 48 with mod assist from PT; 12/28/2023= 44 sec from EOM at lowest postion (approx 21 in) with Left UE support and CGA to min A from PT 5/13: 28.80 sec with HW from [facility]; min assist from PT for full erect standing; 04/09/2024= 26.56 sec with HW in front- pushing up using LUE on armrest of w/c- Min Assist for full erect standing 8/26: 25.41 sec with UE pushing from arm rest and reaching for rail on wall; min a to mod assist from PT throughout all repetitions for adequate anterior weight shift; 07/02/2024= 21.98 sec with min physical assist Goal status: PROGRESSING  3.  Patient will increase FIST score by > 6 points to demonstrate decreased fall risk during functional activities Baseline: 37 5/13: 46/56 Goal status: MET  4.  Patient will complete 10 meter walk test to with HW in less than 1 minute with min assist as to improve gait speed for better community ambulation and to reduce fall risk. Baseline: 6 ft with HW and mod assist; 12/28/2023=18 feet with HW- mod assist  5/13: 2:11 min with HW, min-mod assist. 04/09/2024= Patient ambulated approx 22 feet using HW with Min assist and mod VC for gait sequencing-then experienced LOB requiring Mod Assist to maintain standing. 8/26: 2:72min (0 .64m/s) with HW and min fading to mod assist to advance the RLE with fatigue (165 sec); 06/06/2024= 0.85 m/s sec avg with HM with CGA and close w/c follow. 07/02/2024= 78 sec then 73 sec with HW= 0.14 m/s avg.  Goal status: PROGRESSING  5.  Patient will perform stand pivot transfer to and from [facility] with HW and CGA for safety to  allow safe transfer to toilet with daughter without use of rail on the wall. Baseline: mod  assist and heavy use of rail. 12/28/2023= Min to Mod assist with repetitive VC for technique 5/13: min assist overall with HW; mod assist initially both directions  to prevent posterior LOB. 04/09/2024- Min assist physically in both directions yet increased VC for safety- if provided more time- patient able to make correct movements 75% of time during transfers- still inconsistent with reaching back and pivoting on Right LE  8/26: mod assist from PT to the R and L on this day due to posterior LOB  07/02/2204- Min assist from w/c to standard chair with VC for hand placement- no posterior lean on both attempts today- min assist to stand and VC for sequencing.  Goal status: Progressing  6.  Patient will improve bed mobility to min assist only to reduce care giver burden and improve safety of transfers  Baseline: mod-max assist 5/13: Min-mod assist for sit<>supine. Min assist only for RLE management for rolling; 04/09/2024- unable to test due to time  8/26. Mod assist for sit<>supine movement at trunk and RLE and min assist for rolling tasks on this day.  Goal status: ONGOING  ASSESSMENT:  CLINICAL IMPRESSION:  Treatment was limited to patient's late arrival. She presented today with improved demeanor and more positive attitude today with session. She exhibited no shaking of her head and engaged in all standing activities. She was fatigued with visibly shaking RLE at end of session. She was able to reach diagonal with good weight shifting to affected R side today.Pt will benefit from continued skilled PT to address strength and balance deficits to improve safety for transfers, reduce care giver burden, and allow return to PLOF and possible return home from Assisted living.   OBJECTIVE IMPAIRMENTS: Abnormal gait, cardiopulmonary status limiting activity, decreased activity tolerance, decreased balance, decreased  cognition, decreased coordination, decreased endurance, decreased knowledge of use of DME, decreased mobility, difficulty walking, decreased ROM, decreased strength, hypomobility, increased muscle spasms, impaired flexibility, impaired sensation, impaired tone, impaired UE functional use, improper body mechanics, and postural dysfunction.   ACTIVITY LIMITATIONS: carrying, lifting, bending, standing, squatting, stairs, transfers, bed mobility, continence, bathing, toileting, dressing, reach over head, hygiene/grooming, and locomotion level  PARTICIPATION LIMITATIONS: cleaning, interpersonal relationship, shopping, and community activity  PERSONAL FACTORS: 3+ comorbidities: HTN, chronic CVA, Afib, Lupus  are also affecting patient's functional outcome.   REHAB POTENTIAL: Fair chronic CVA with poor progress in prior interventions with this clinic  CLINICAL DECISION MAKING: Unstable/unpredictable  EVALUATION COMPLEXITY: High  PLAN:  PT FREQUENCY: 1-2x/week  PT DURATION: 12 weeks  PLANNED INTERVENTIONS: 97110-Therapeutic exercises, 97530- Therapeutic activity, [id number]- Neuromuscular re-education, 97535- Self Care, [id number]- Manual therapy, [phone]- Gait training, [phone]- Orthotic Fit/training, [phone]- Aquatic Therapy, [phone]- Splinting, [id number]- Electrical stimulation (unattended), [phone]- Electrical stimulation (manual), Patient/Family education, Balance training, Stair training, Joint mobilization, Vestibular training, Visual/preceptual remediation/compensation, Cognitive remediation, DME instructions, Wheelchair mobility training, Cryotherapy, and Moist heat  PLAN FOR NEXT SESSION:  Continue with Lite Gait Continue with functional standing Continue with transfer safety Continue with gait training.     [name] PT  Physical Therapist- [facility]   9:09 AM 09/10/24

## 2024-09-10 ENCOUNTER — Ambulatory Visit

## 2024-09-10 DIAGNOSIS — M6281 Muscle weakness (generalized): Secondary | ICD-10-CM

## 2024-09-10 DIAGNOSIS — R269 Unspecified abnormalities of gait and mobility: Secondary | ICD-10-CM

## 2024-09-10 DIAGNOSIS — R482 Apraxia: Secondary | ICD-10-CM

## 2024-09-10 DIAGNOSIS — R2689 Other abnormalities of gait and mobility: Secondary | ICD-10-CM

## 2024-09-10 DIAGNOSIS — R2681 Unsteadiness on feet: Secondary | ICD-10-CM | POA: Diagnosis not present

## 2024-09-10 DIAGNOSIS — R278 Other lack of coordination: Secondary | ICD-10-CM

## 2024-09-10 DIAGNOSIS — R262 Difficulty in walking, not elsewhere classified: Secondary | ICD-10-CM

## 2024-09-10 DIAGNOSIS — R41841 Cognitive communication deficit: Secondary | ICD-10-CM

## 2024-09-12 ENCOUNTER — Ambulatory Visit

## 2024-09-12 DIAGNOSIS — I63542 Cerebral infarction due to unspecified occlusion or stenosis of left cerebellar artery: Secondary | ICD-10-CM

## 2024-09-12 DIAGNOSIS — M6281 Muscle weakness (generalized): Secondary | ICD-10-CM

## 2024-09-12 DIAGNOSIS — R41841 Cognitive communication deficit: Secondary | ICD-10-CM

## 2024-09-12 DIAGNOSIS — R2681 Unsteadiness on feet: Secondary | ICD-10-CM

## 2024-09-12 DIAGNOSIS — R2689 Other abnormalities of gait and mobility: Secondary | ICD-10-CM

## 2024-09-12 DIAGNOSIS — R262 Difficulty in walking, not elsewhere classified: Secondary | ICD-10-CM

## 2024-09-12 DIAGNOSIS — R482 Apraxia: Secondary | ICD-10-CM

## 2024-09-12 DIAGNOSIS — R269 Unspecified abnormalities of gait and mobility: Secondary | ICD-10-CM

## 2024-09-12 DIAGNOSIS — R278 Other lack of coordination: Secondary | ICD-10-CM

## 2024-09-12 NOTE — Therapy (Signed)
 OUTPATIENT PHYSICAL THERAPY TREATMENT/Physical Therapy Progress Note   Dates of reporting period  07/09/2024   to   09/12/2024    Patient Name: Lindsey [name] MRN: [id number] DOB:Apr 09, 1956, 68 y.o., female Today's Date: 09/12/2024  PCP: Lorel Maxie LABOR, MD  REFERRING PROVIDER: Lorilee Sven SQUIBB, MD   END OF SESSION:   PT End of Session - 09/12/24 0937     Visit Number 60    Number of Visits 72    Date for Recertification  09/24/24    Authorization Type Medicare/Medicaid    Progress Note Due on Visit 70    PT Start Time 0853    PT Stop Time 0933    PT Time Calculation (min) 40 min    Equipment Utilized During Treatment Gait belt    Activity Tolerance Patient tolerated treatment well;Patient limited by fatigue    Behavior During Therapy WFL for tasks assessed/performed                         Past Medical History:  Diagnosis Date   Aphasia    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 No pertinent surgical history. 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 Dtr present for session today.    PERTINENT HISTORY:  Daughter present for Evaluation to provide hx. Pt is familiar to this clinic and has been seen for multiple bouts PT since initial CVA in 2021. Pt d/c'ed from PT serviced in April of 2024 with plans to receive PT services through [facility]. Daughter reports that she was only receiving minimal therapy in facility. Reports at least 1 fall in the last 6 months and daughter asked staff to transfer pt with lift since fall.  Daughter would want pt to demonstrate improve safety with transfers to allow bil transfers with reduced use of rail to allow pt to return home for assisted living.    PAIN:  Are you having pain? No  PRECAUTIONS: Fall  RED FLAGS: None   WEIGHT BEARING RESTRICTIONS: No  FALLS: Has patient fallen in last 6 months? Yes. Number of falls 1  LIVING ENVIRONMENT: Lives with: lives with their family and lives in a skilled nursing  facility Lives in: Other SNF  Stairs: No Has following equipment at home: Hemi walker and Wheelchair (manual)  PLOF: Requires assistive device for independence, Needs assistance with ADLs, Needs assistance with homemaking, and WC level currently   PATIENT GOALS: move better - walking or transferring with hemi walking   OBJECTIVE:  Note: Objective measures were completed at Evaluation unless otherwise noted.  DIAGNOSTIC FINDINGS:  Head CT:  IMPRESSION: 1. No acute intracranial abnormality. 2. No acute displaced fracture or traumatic listhesis of the cervical spine.  Cervical CT:  MPRESSION: 1. No acute intracranial abnormality. 2. No acute displaced fracture or traumatic listhesis of the cervical spine.  COGNITION: Overall cognitive status: History of cognitive impairments - at baseline   SENSATION: Light touch: Impaired  Proprioception: Impaired  Able to detect depe pressure   COORDINATION: Increased tone on the RLE   EDEMA:  Mild RLE distal edema   MUSCLE TONE: RLE: Mild and Moderate  MUSCLE LENGTH: Hamstrings: grossly limited   POSTURE: rounded shoulders, forward head, and posterior bias   LOWER EXTREMITY ROM:     Active  Right Eval Left Eval  Hip flexion  Limited to 5 deg beyond 90 in sitting  Knee flexion  90deg in sitting  Knee extension  Lacking 10 deg full extension  Ankle dorsiflexion  none  Ankle plantarflexion  none   (Blank rows = not tested)  LOWER EXTREMITY MMT:    MMT Right Eval Left Eval  Hip flexion  2+  Hip extension    Hip abduction  2  Hip adduction  3  Hip internal rotation    Hip external rotation    Knee flexion  2+  Knee extension  3+  Ankle dorsiflexion  0  Ankle plantarflexion  0  Ankle inversion    Ankle eversion    (Blank rows = not tested)  BED MOBILITY:  Sit to supine Mod A Supine to sit Mod A Rolling to Right Min A Rolling to Left Min A  TRANSFERS: Assistive device utilized: Hemi walker  Sit to stand:  Mod A Stand to sit: Mod A Chair to chair: Mod A Floor: unable to perform   CURB:  Level of Assistance: Total A Assistive device utilized: rail Curb Comments: unable to perform   GAIT: Gait pattern: non-functional constant posterior bias , step to pattern, decreased stance time- Right, Right hip hike, lateral hip instability, and lateral lean- Left Distance walked: 43ft Assistive  device utilized: Hemi walker Level of assistance: Mod A Comments: moderate cues for sequencing of HW and gait pattern in turns   FUNCTIONAL TESTS:  5 times sit to stand: 48 sec with mod Assist from PT  Timed up and go (TUG): unable to perform  10 meter walk test: 6 ft with min-mod assist and HW.  FIST:                                                                                                                               TREATMENT DATE: 09/12/2024   *Patient received in manual w/c with dtr present today.   Self care/Home management:  Discussed stretching technique to decrease ext tone in RLE with dtr. Had patient demonstrate her ability to wheel up to wall and position her right foot at wall and proceed to rock w/c back and forth with R foot planted at wall to facilitate increasing R knee flex ROM activity. Patient performed back and forth for 4 min.  Discussed with both importance of performing daily at home to improve her ROM and manage her tone.    Therapeutic Activities: dynamic therapeutic activities designed to achieve improved functional performance     Sit to stand  x 5 throughout session- using left UE to push up from armrest-  Max VC and reminders today to keep LUE on armrest.   Gait training:  Patient ambulated in clinic today use of hemi walker on left side, CGA  to min A with use of gait belt, and dtr following with w/c close behind. She exhibited mostly step to gait on 1st trial- totaling 73 feet total distance. She rested a couple of min followed by another 65 feet walk with  instruction ahead of time to try for more reciprocal steps. Patient was able to walk the first 45 feet well with some improved reciprocal steps yet reverted back to step to gait with some difficulty negotiating walker - placing it more in front of her than out to side.   Bwd walking-approx 10 feet at end of session - using HW, with minimal RLE step length. Difficulty sequencing and very fatigued at end of session.        PATIENT EDUCATION: Education details: .  Pt educated throughout session about proper posture and technique with exercises. Improved exercise technique, movement at target joints, use of target muscles after min to mod verbal, visual, tactile cues Interpretation of outcome measure; need for continued progress to justify continued PT services.   Person educated: Patient Education method: Explanation Education comprehension: verbalized understanding  HOME EXERCISE PROGRAM: Access Code: [email or web address] URL: [email or web address] Date: 12/05/2023 Prepared by: Massie Dollar  Exercises - Sit to Stand with Counter Support  - 1 x daily - 3-4 x weekly - 2 sets - 10 reps - Seated Long Arc Quad  - 1 x daily - 3-4 x weekly - 2 sets - 10 reps - 2 hold - Seated March  -  1 x daily - 3-4 x weekly - 2 sets - 10 reps - 2 hold - Standing Hip Abduction with Counter Support  - 1 x daily - 7 x weekly - 3 sets - 5 reps - Wide Stance with Counter Support  - 1 x daily - 7 x weekly - 3 sets - 2 reps - 30 hold - Seated Hip Abduction  - 1 x daily - 7 x weekly - 3 sets - 10 reps - Supine Bridge  - 1 x daily - 7 x weekly - 3 sets - 10 reps - Supine Short Arc Quad  - 1 x daily - 7 x weekly - 3 sets - 10 reps - Bent Knee Fallouts  - 1 x daily - 7 x weekly - 3 sets - 10 reps - Small Range Straight Leg Raise  - 1 x daily - 7 x weekly - 3 sets - 10 reps - Supine Heel Slide  - 1 x daily - 7 x weekly - 3 sets - 10 reps  GOALS: Goals reviewed with patient? Yes  SHORT TERM GOALS: Target date:  08/03/2024  Patient will be independent in home exercise program to improve strength/mobility for better functional independence with ADLs. Baseline: to be provided on visit 2: 12/28/2023- No caregiver with patient to determine if compliant with HEP Goal  5/13: daugher states that they are completing some exercises almost daily; 04/09/2024= No caregiver present today to f/u with compliance of HEP- Patient verbalized yes and stated standing, moving my legs. 07/02/2024= upon last conversation with patient dtr at last visit- she is going to talk to the lady who sometimes bring patient to clinic to see if she can do some standing and the HEP at the facility with patient.  status: IN PROGRESS  LONG TERM GOALS: Target date: 09/24/2024  2.  Patient (> 30 years old) will complete five times sit to stand test by 15 seconds and only min assist indicating an increased LE strength and improved balance. Baseline: 48 with mod assist from PT; 12/28/2023= 44 sec from EOM at lowest postion (approx 21 in) with Left UE support and CGA to min A from PT 5/13: 28.80 sec with HW from [facility]; min assist from PT for full erect standing; 04/09/2024= 26.56 sec with HW in front- pushing up using LUE on armrest of w/c- Min Assist for full erect standing 8/26: 25.41 sec with UE pushing from arm rest and reaching for rail on wall; min a to mod assist from PT throughout all repetitions for adequate anterior weight shift; 07/02/2024= 21.98 sec with min physical assist Goal status: PROGRESSING  3.  Patient will increase FIST score by > 6 points to demonstrate decreased fall risk during functional activities Baseline: 37 5/13: 46/56 Goal status: MET  4.  Patient will complete 10 meter walk test to with HW in less than 1 minute with min assist as to improve gait speed for better community ambulation and to reduce fall risk. Baseline: 6 ft with HW and mod assist; 12/28/2023=18 feet with HW- mod assist  5/13: 2:11 min with HW, min-mod assist.  04/09/2024= Patient ambulated approx 22 feet using HW with Min assist and mod VC for gait sequencing-then experienced LOB requiring Mod Assist to maintain standing. 8/26: 2:73min (0 .85m/s) with HW and min fading to mod assist to advance the RLE with fatigue (165 sec); 06/06/2024= 0.85 m/s sec avg with HM with CGA and close w/c follow. 07/02/2024= 78 sec then 73 sec with HW=  0.14 m/s avg.  Goal status: PROGRESSING  5.  Patient will perform stand pivot transfer to and from [facility] with HW and CGA for safety to allow safe transfer to toilet with daughter without use of rail on the wall. Baseline: mod assist and heavy use of rail. 12/28/2023= Min to Mod assist with repetitive VC for technique 5/13: min assist overall with HW; mod assist initially both directions  to prevent posterior LOB. 04/09/2024- Min assist physically in both directions yet increased VC for safety- if provided more time- patient able to make correct movements 75% of time during transfers- still inconsistent with reaching back and pivoting on Right LE  8/26: mod assist from PT to the R and L on this day due to posterior LOB  07/02/2204- Min assist from w/c to standard chair with VC for hand placement- no posterior lean on both attempts today- min assist to stand and VC for sequencing.  Goal status: Progressing  6.  Patient will improve bed mobility to min assist only to reduce care giver burden and improve safety of transfers  Baseline: mod-max assist 5/13: Min-mod assist for sit<>supine. Min assist only for RLE management for rolling; 04/09/2024- unable to test due to time  8/26. Mod assist for sit<>supine movement at trunk and RLE and min assist for rolling tasks on this day.  Goal status: ONGOING  7. Patient will ambulate 150 feet or more with use of hemi walker, CGA for improved overall functional mobility for household distances.   Baseline: 09/12/2024= 73 feet with HW  Goal status: New  ASSESSMENT:  CLINICAL IMPRESSION:  Patient  presents with good motivation for today's session. She was challenged to walk today as treatment lately have been more focused on standing activities. Added a new goal as there was no goal for a gait distance. She was challenged to walk as far as she could and collected 2 rounds of data. She has improved with gait distance from previous measures/sessions yet requires increased time to complete. Will reassess her gait speed next visit while walking. Patient's condition has the potential to improve in response to therapy. Maximum improvement is yet to be obtained. The anticipated improvement is attainable and reasonable in a generally predictable time. Pt will benefit from continued skilled PT to address strength and balance deficits to improve safety for transfers, reduce care giver burden, and allow return to PLOF and possible return home from Assisted living.   OBJECTIVE IMPAIRMENTS: Abnormal gait, cardiopulmonary status limiting activity, decreased activity tolerance, decreased balance, decreased cognition, decreased coordination, decreased endurance, decreased knowledge of use of DME, decreased mobility, difficulty walking, decreased ROM, decreased strength, hypomobility, increased muscle spasms, impaired flexibility, impaired sensation, impaired tone, impaired UE functional use, improper body mechanics, and postural dysfunction.   ACTIVITY LIMITATIONS: carrying, lifting, bending, standing, squatting, stairs, transfers, bed mobility, continence, bathing, toileting, dressing, reach over head, hygiene/grooming, and locomotion level  PARTICIPATION LIMITATIONS: cleaning, interpersonal relationship, shopping, and community activity  PERSONAL FACTORS: 3+ comorbidities: HTN, chronic CVA, Afib, Lupus  are also affecting patient's functional outcome.   REHAB POTENTIAL: Fair chronic CVA with poor progress in prior interventions with this clinic  CLINICAL DECISION MAKING: Unstable/unpredictable  EVALUATION  COMPLEXITY: High  PLAN:  PT FREQUENCY: 1-2x/week  PT DURATION: 12 weeks  PLANNED INTERVENTIONS: 97110-Therapeutic exercises, 97530- Therapeutic activity, [id number]- Neuromuscular re-education, 97535- Self Care, [id number]- Manual therapy, [id number]- Gait training, [phone]- Orthotic Fit/training, [id number]- Aquatic Therapy, [phone]- Splinting, [id number]- Electrical stimulation (unattended), [phone]- Electrical stimulation (manual), Patient/Family education, Balance training, Stair  training, Joint mobilization, Vestibular training, Visual/preceptual remediation/compensation, Cognitive remediation, DME instructions, Wheelchair mobility training, Cryotherapy, and Moist heat  PLAN FOR NEXT SESSION:  Measure 10 MWT next session  Continue with functional standing activities. Continue with transfer safety Continue with gait training- fwd/bwd    [name] PT  Physical Therapist- [facility]   10:03 AM 09/12/2024

## 2024-09-17 ENCOUNTER — Ambulatory Visit

## 2024-09-19 ENCOUNTER — Ambulatory Visit

## 2024-09-24 ENCOUNTER — Ambulatory Visit

## 2024-10-01 ENCOUNTER — Ambulatory Visit

## 2024-10-08 ENCOUNTER — Ambulatory Visit

## 2024-10-10 ENCOUNTER — Ambulatory Visit: Attending: Physical Medicine and Rehabilitation

## 2024-10-10 DIAGNOSIS — R482 Apraxia: Secondary | ICD-10-CM | POA: Insufficient documentation

## 2024-10-10 DIAGNOSIS — R278 Other lack of coordination: Secondary | ICD-10-CM | POA: Diagnosis present

## 2024-10-10 DIAGNOSIS — R262 Difficulty in walking, not elsewhere classified: Secondary | ICD-10-CM | POA: Diagnosis present

## 2024-10-10 DIAGNOSIS — R41841 Cognitive communication deficit: Secondary | ICD-10-CM | POA: Diagnosis present

## 2024-10-10 DIAGNOSIS — M6281 Muscle weakness (generalized): Secondary | ICD-10-CM | POA: Diagnosis present

## 2024-10-10 DIAGNOSIS — R2681 Unsteadiness on feet: Secondary | ICD-10-CM | POA: Insufficient documentation

## 2024-10-10 DIAGNOSIS — R2689 Other abnormalities of gait and mobility: Secondary | ICD-10-CM | POA: Diagnosis present

## 2024-10-10 DIAGNOSIS — R269 Unspecified abnormalities of gait and mobility: Secondary | ICD-10-CM | POA: Insufficient documentation

## 2024-10-10 DIAGNOSIS — I63542 Cerebral infarction due to unspecified occlusion or stenosis of left cerebellar artery: Secondary | ICD-10-CM | POA: Insufficient documentation

## 2024-10-10 NOTE — Therapy (Signed)
 " OUTPATIENT PHYSICAL THERAPY TREATMENT/RECERT/PT DISCHARGE SUMMARY  Patient Name: Lindsey [name] MRN: [id number] DOB:January 09, 1956, 69 y.o., female Today's Date: 10/11/2024  PCP: Lorel Maxie LABOR, MD  REFERRING PROVIDER: Lorilee Sven SQUIBB, MD   END OF SESSION:   PT End of Session - 10/10/24 0857     Visit Number 61    Number of Visits 61    Date for Recertification  10/10/24    Authorization Type Medicare/Medicaid    Progress Note Due on Visit 70    PT Start Time 0849    PT Stop Time 0928    PT Time Calculation (min) 39 min    Equipment Utilized During Treatment Gait belt    Activity Tolerance Patient tolerated treatment well;Patient limited by fatigue    Behavior During Therapy WFL for tasks assessed/performed                         Past Medical History:  Diagnosis Date   Aphasia    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 No pertinent surgical history. 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   REFERRING DIAG:  Diagnosis  I69.398,R25.2 (ICD-10-CM) - Spasticity as late effect of cerebrovascular accident (CVA)    THERAPY DIAG:  Unsteadiness on feet - Plan: PT plan of care cert/re-cert  Difficulty in walking, not elsewhere classified - Plan: PT plan of care cert/re-cert  Abnormality of gait and mobility - Plan: PT plan of care cert/re-cert  Other abnormalities of gait and mobility - Plan: PT plan of care cert/re-cert  Muscle weakness (generalized) - Plan: PT plan of care cert/re-cert  Other lack of coordination - Plan: PT plan of care cert/re-cert  Apraxia - Plan: PT plan of care cert/re-cert  Cognitive communication deficit - Plan: PT plan of care cert/re-cert  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 Dtr later in session reports frustration that she works hard to do everything she can for her mother- adjusting her work schedule and motherly duties to make sure she is able to take her mom to PT sessions and medical appointments and that her mother is not working hard to try to get better.  PERTINENT HISTORY:  Daughter present for Evaluation to provide hx. Pt is familiar to this clinic and has been seen for multiple bouts PT since initial CVA in 2021. Pt d/c'ed from PT serviced in April of 2024 with plans to receive PT services through [facility]. Daughter reports that she  was only receiving minimal therapy in facility. Reports at least 1 fall in the last 6 months and daughter asked staff to transfer pt with lift since fall.  Daughter would want pt to demonstrate improve safety with transfers to allow bil transfers with reduced use of rail to allow pt to return home for assisted living.    PAIN:  Are you having pain? No  PRECAUTIONS: Fall  RED FLAGS: None   WEIGHT BEARING RESTRICTIONS: No  FALLS: Has patient fallen in last 6 months? Yes. Number of falls 1  LIVING ENVIRONMENT: Lives with: lives with their family and lives in a skilled nursing facility Lives in: Other SNF  Stairs: No Has following equipment at home: Hemi walker and Wheelchair (manual)  PLOF: Requires assistive device for independence, Needs assistance with ADLs, Needs assistance with homemaking, and WC level currently   PATIENT GOALS: move better - walking or transferring with hemi walking   OBJECTIVE:  Note: Objective measures were completed at Evaluation unless otherwise noted.  DIAGNOSTIC FINDINGS:  Head CT:  IMPRESSION: 1. No acute intracranial abnormality. 2. No acute displaced fracture or traumatic listhesis of the cervical spine.  Cervical CT:  MPRESSION: 1. No acute intracranial abnormality. 2. No acute displaced fracture or traumatic listhesis of the cervical spine.  COGNITION: Overall cognitive status: History of cognitive impairments - at baseline   SENSATION: Light touch: Impaired  Proprioception: Impaired  Able to detect depe pressure   COORDINATION: Increased tone on the RLE   EDEMA:  Mild RLE distal edema   MUSCLE TONE: RLE: Mild and Moderate  MUSCLE LENGTH: Hamstrings: grossly limited   POSTURE: rounded shoulders, forward head, and posterior bias   LOWER EXTREMITY ROM:     Active  Right Eval Left Eval  Hip flexion  Limited to 5 deg beyond 90 in sitting  Knee flexion  90deg in sitting  Knee extension  Lacking 10 deg full extension   Ankle dorsiflexion  none  Ankle plantarflexion  none   (Blank rows = not tested)  LOWER EXTREMITY MMT:    MMT Right Eval Left Eval  Hip flexion  2+  Hip extension    Hip abduction  2  Hip adduction  3  Hip internal rotation    Hip external rotation    Knee flexion  2+  Knee extension  3+  Ankle dorsiflexion  0  Ankle plantarflexion  0  Ankle inversion    Ankle eversion    (Blank rows = not tested)  BED MOBILITY:  Sit to supine Mod A Supine to sit Mod A Rolling to Right Min A Rolling to Left Min A  TRANSFERS: Assistive device utilized: Hemi walker  Sit to stand: Mod A Stand to  sit: Mod A Chair to chair: Mod A Floor: unable to perform   CURB:  Level of Assistance: Total A Assistive device utilized: rail Curb Comments: unable to perform   GAIT: Gait pattern: non-functional constant posterior bias , step to pattern, decreased stance time- Right, Right hip hike, lateral hip instability, and lateral lean- Left Distance walked: 1ft Assistive device utilized: Hemi walker Level of assistance: Mod A Comments: moderate cues for sequencing of HW and gait pattern in turns   FUNCTIONAL TESTS:  5 times sit to stand: 48 sec with mod Assist from PT  Timed up and go (TUG): unable to perform  10 meter walk test: 6 ft with min-mod assist and HW.  FIST:                                                                                                                               TREATMENT DATE: 10/11/2024   *Patient received in manual w/c with dtr present today.   Physical therapy treatment session today consisted of completing assessment of goals and administration of testing as demonstrated and documented in flow sheet, treatment, and goals section of this note. Addition treatments may be found below.   Five times Sit to Stand Test (FTSS) Stand up and sit down as quickly as possible 5 times, keeping your arms folded across your chest.    TIME:  19.32 sec with min physical  assist on Right side  Times > 13.6 seconds is associated with increased disability and morbidity (Guralnik, 2000) Times > 15 seconds is predictive of recurrent falls in healthy individuals aged 62 and older (Buatois, et al., 2008) Normal performance values in community dwelling individuals aged 100 and older (Bohannon, 2006): 60-69 years: 11.4 seconds 70-79 years: 12.6 seconds 80-89 years: 14.8 seconds  MCID: >= 2.3 seconds for Vestibular Disorders (Meretta, 2006)   10 Meter walk test- Patient started ambulating with use of HW and walked approx 10 feet then abruptly stopped and requested to stop therapy and GO HOME!  She sat down and repeated herself 3 more times. Author wheeled to a more private section of gym and questioned if she was okay. She just shook her head and said I don't want to do it. Asked if okay to go get Daughter to discuss and she said Go ahead. Located her dtr and proceeded to have a detailed discussion about rehab- lack of progress, importance of consistency of visits and HEP. Daughter became tearful stating she understood and frustrated that her mother does not put forth more effort at her home or in PT sessions. She stated she has sacrificed a lot to take care of mother. Next, daughter came back with dino to discuss with Patient. Informed both that maybe a break from PT would be most appropriate to let patient decide if she wants to pursue and explained in detail expectations from PT standpoint that she has to not only consistently participate in clinic but just  as important to work at home for PT to be effective or enstill and real change in her current function. Both patient and daughter agreeable to PT discharge for now. Instructed both that if she decides she wants to return that she would need another MD order.   Reviewing goals and discussed HEP Self care/Home management:  Reviewed HEP including RLE stretching technique to decrease ext tone in with patient and dtr.  Patient admitted to not really performing much despite fact that she has demonstrated ability to perform on her own.           PATIENT EDUCATION: Education details: .  Pt educated throughout session about proper posture and technique with exercises. Improved exercise technique, movement at target joints, use of target muscles after min to mod verbal, visual, tactile cues Interpretation of outcome measure; need for continued progress to justify continued PT services.   Person educated: Patient Education method: Explanation Education comprehension: verbalized understanding  HOME EXERCISE PROGRAM: Access Code: [email or web address] URL: [email or web address] Date: 12/05/2023 Prepared by: Massie Dollar  Exercises - Sit to Stand with Counter Support  - 1 x daily - 3-4 x weekly - 2 sets - 10 reps - Seated Long Arc Quad  - 1 x daily - 3-4 x weekly - 2 sets - 10 reps - 2 hold - Seated March  - 1 x daily - 3-4 x weekly - 2 sets - 10 reps - 2 hold - Standing Hip Abduction with Counter Support  - 1 x daily - 7 x weekly - 3 sets - 5 reps - Wide Stance with Counter Support  - 1 x daily - 7 x weekly - 3 sets - 2 reps - 30 hold - Seated Hip Abduction  - 1 x daily - 7 x weekly - 3 sets - 10 reps - Supine Bridge  - 1 x daily - 7 x weekly - 3 sets - 10 reps - Supine Short Arc Quad  - 1 x daily - 7 x weekly - 3 sets - 10 reps - Bent Knee Fallouts  - 1 x daily - 7 x weekly - 3 sets - 10 reps - Small Range Straight Leg Raise  - 1 x daily - 7 x weekly - 3 sets - 10 reps - Supine Heel Slide  - 1 x daily - 7 x weekly - 3 sets - 10 reps  GOALS: Goals reviewed with patient? Yes  SHORT TERM GOALS: Target date: 08/03/2024  Patient will be independent in home exercise program to improve strength/mobility for better functional independence with ADLs. Baseline: to be provided on visit 2: 12/28/2023- No caregiver with patient to determine if compliant with HEP Goal  5/13: daugher states that they are  completing some exercises almost daily; 04/09/2024= No caregiver present today to f/u with compliance of HEP- Patient verbalized yes and stated standing, moving my legs. 07/02/2024= upon last conversation with patient dtr at last visit- she is going to talk to the lady who sometimes bring patient to clinic to see if she can do some standing and the HEP at the facility with patient. 10/10/2024- Patient reports some compliance with current HEP but not consistent- able to only demo knee ext as her HEP status: NOT PROGRESSING  LONG TERM GOALS: Target date: 10/10/2024  2.  Patient (> 2 years old) will complete five times sit to stand test by 15 seconds and only min assist indicating an increased LE strength and improved balance. Baseline: 48 with mod assist from  PT; 12/28/2023= 44 sec from EOM at lowest postion (approx 21 in) with Left UE support and CGA to min A from PT 5/13: 28.80 sec with HW from [facility]; min assist from PT for full erect standing; 04/09/2024= 26.56 sec with HW in front- pushing up using LUE on armrest of w/c- Min Assist for full erect standing 8/26: 25.41 sec with UE pushing from arm rest and reaching for rail on wall; min a to mod assist from PT throughout all repetitions for adequate anterior weight shift; 07/02/2024= 21.98 sec with min physical assist; 10/10/2024= 19.32 sec with min physical assist on Right side Goal status: PROGRESSING  3.  Patient will increase FIST score by > 6 points to demonstrate decreased fall risk during functional activities Baseline: 37 5/13: 46/56 Goal status: MET  4.  Patient will complete 10 meter walk test to with HW in less than 1 minute with min assist as to improve gait speed for better community ambulation and to reduce fall risk. Baseline: 6 ft with HW and mod assist; 12/28/2023=18 feet with HW- mod assist  5/13: 2:11 min with HW, min-mod assist. 04/09/2024= Patient ambulated approx 22 feet using HW with Min assist and mod VC for gait sequencing-then experienced  LOB requiring Mod Assist to maintain standing. 8/26: 2:22min (0 .73m/s) with HW and min fading to mod assist to advance the RLE with fatigue (165 sec); 06/06/2024= 0.85 m/s sec avg with HM with CGA and close w/c follow. 07/02/2024= 78 sec then 73 sec with HW= 0.14 m/s avg. 10/10/2024- Patient stopped after 10 feet requesting to end PT and go home Goal status: not MET  5.  Patient will perform stand pivot transfer to and from [facility] with HW and CGA for safety to allow safe transfer to toilet with daughter without use of rail on the wall. Baseline: mod assist and heavy use of rail. 12/28/2023= Min to Mod assist with repetitive VC for technique 5/13: min assist overall with HW; mod assist initially both directions  to prevent posterior LOB. 04/09/2024- Min assist physically in both directions yet increased VC for safety- if provided more time- patient able to make correct movements 75% of time during transfers- still inconsistent with reaching back and pivoting on Right LE  8/26: mod assist from PT to the R and L on this day due to posterior LOB  07/02/2204- Min assist from w/c to standard chair with VC for hand placement- no posterior lean on both attempts today- min assist to stand and VC for sequencing.  Goal status: Progressing  6.  Patient will improve bed mobility to min assist only to reduce care giver burden and improve safety of transfers  Baseline: mod-max assist 5/13: Min-mod assist for sit<>supine. Min assist only for RLE management for rolling; 04/09/2024- unable to test due to time  8/26. Mod assist for sit<>supine movement at trunk and RLE and min assist for rolling tasks on this day.  Goal status: ONGOING  7. Patient will ambulate 150 feet or more with use of hemi walker, CGA for improved overall functional mobility for household distances.   Baseline: 09/12/2024= 73 feet with HW  Goal status: New  ASSESSMENT:  CLINICAL IMPRESSION:  Patient was seen today for potential recert after missing past  few weeks due to her dtr work schedule and busy with Holidays. Attempted to reassess goals- upon findings- patient has not been compliant with any of instructed PT home exercises. She then just quit walking upon 10 MWT and requested to go  home. Long discussion separately at first with patient and then dtr about plan for PT and then together and ultimately decided to discharge today due to lack of progress and patient not participating consistently. See above section for details but will plan to discharge today and both patient and daughter in agreement.   OBJECTIVE IMPAIRMENTS: Abnormal gait, cardiopulmonary status limiting activity, decreased activity tolerance, decreased balance, decreased cognition, decreased coordination, decreased endurance, decreased knowledge of use of DME, decreased mobility, difficulty walking, decreased ROM, decreased strength, hypomobility, increased muscle spasms, impaired flexibility, impaired sensation, impaired tone, impaired UE functional use, improper body mechanics, and postural dysfunction.   ACTIVITY LIMITATIONS: carrying, lifting, bending, standing, squatting, stairs, transfers, bed mobility, continence, bathing, toileting, dressing, reach over head, hygiene/grooming, and locomotion level  PARTICIPATION LIMITATIONS: cleaning, interpersonal relationship, shopping, and community activity  PERSONAL FACTORS: 3+ comorbidities: HTN, chronic CVA, Afib, Lupus  are also affecting patient's functional outcome.   REHAB POTENTIAL: Fair chronic CVA with poor progress in prior interventions with this clinic  CLINICAL DECISION MAKING: Unstable/unpredictable  EVALUATION COMPLEXITY: High  PLAN:  PT FREQUENCY: 1 visit  PT DURATION: 1 day  PLANNED INTERVENTIONS: 97110-Therapeutic exercises, 97530- Therapeutic activity, 97112- Neuromuscular re-education, 97535- Self Care, [id number]- Manual therapy, [phone]- Gait training, [phone]- Orthotic Fit/training, [phone]- Aquatic Therapy, [phone]-  Splinting, [id number]- Electrical stimulation (unattended), [phone]- Electrical stimulation (manual), Patient/Family education, Balance training, Stair training, Joint mobilization, Vestibular training, Visual/preceptual remediation/compensation, Cognitive remediation, DME instructions, Wheelchair mobility training, Cryotherapy, and Moist heat  PLAN FOR NEXT SESSION:  Discharge patient today.     [name] PT  Physical Therapist- [facility]   8:23 AM 10/11/2024    "

## 2024-10-14 ENCOUNTER — Ambulatory Visit: Admitting: Physical Medicine and Rehabilitation

## 2024-10-15 ENCOUNTER — Ambulatory Visit

## 2024-10-17 ENCOUNTER — Ambulatory Visit

## 2024-10-22 ENCOUNTER — Ambulatory Visit

## 2024-10-24 ENCOUNTER — Ambulatory Visit

## 2024-10-29 ENCOUNTER — Ambulatory Visit

## 2024-10-31 ENCOUNTER — Ambulatory Visit

## 2024-11-05 ENCOUNTER — Ambulatory Visit

## 2024-11-07 ENCOUNTER — Ambulatory Visit

## 2024-11-12 ENCOUNTER — Ambulatory Visit

## 2024-11-14 ENCOUNTER — Ambulatory Visit

## 2024-11-19 ENCOUNTER — Ambulatory Visit

## 2024-11-21 ENCOUNTER — Ambulatory Visit

## 2024-11-22 ENCOUNTER — Encounter: Admitting: Physical Medicine and Rehabilitation

## 2024-11-26 ENCOUNTER — Ambulatory Visit

## 2024-11-28 ENCOUNTER — Ambulatory Visit

## 2024-12-03 ENCOUNTER — Ambulatory Visit

## 2024-12-05 ENCOUNTER — Ambulatory Visit

## 2024-12-10 ENCOUNTER — Ambulatory Visit

## 2024-12-12 ENCOUNTER — Ambulatory Visit

## 2024-12-17 ENCOUNTER — Ambulatory Visit

## 2024-12-19 ENCOUNTER — Ambulatory Visit

## 2024-12-24 ENCOUNTER — Ambulatory Visit

## 2024-12-26 ENCOUNTER — Ambulatory Visit

## 2024-12-31 ENCOUNTER — Ambulatory Visit

## 2025-01-02 ENCOUNTER — Ambulatory Visit

## 2025-01-07 ENCOUNTER — Ambulatory Visit

## 2025-01-09 ENCOUNTER — Ambulatory Visit

## 2025-01-14 ENCOUNTER — Ambulatory Visit

## 2025-01-16 ENCOUNTER — Ambulatory Visit

## 2025-01-21 ENCOUNTER — Ambulatory Visit

## 2025-01-23 ENCOUNTER — Ambulatory Visit

## 2025-01-28 ENCOUNTER — Ambulatory Visit

## 2025-01-30 ENCOUNTER — Ambulatory Visit

## 2025-02-04 ENCOUNTER — Ambulatory Visit

## 2025-02-06 ENCOUNTER — Ambulatory Visit

## 2025-02-11 ENCOUNTER — Ambulatory Visit

## 2025-05-20 ENCOUNTER — Ambulatory Visit: Admitting: Oncology

## 2025-05-20 ENCOUNTER — Other Ambulatory Visit
